# Patient Record
Sex: Female | Born: 1950 | Race: Black or African American | Hispanic: No | Marital: Single | Smoking: Former smoker
Health system: Southern US, Community
[De-identification: ages and names within clinical notes are randomized; demographics above are authoritative.]

## PROBLEM LIST (undated history)

## (undated) DIAGNOSIS — U071 COVID-19: Secondary | ICD-10-CM

## (undated) DIAGNOSIS — I639 Cerebral infarction, unspecified: Secondary | ICD-10-CM

## (undated) DIAGNOSIS — E119 Type 2 diabetes mellitus without complications: Secondary | ICD-10-CM

## (undated) DIAGNOSIS — I10 Essential (primary) hypertension: Secondary | ICD-10-CM

## (undated) DIAGNOSIS — R011 Cardiac murmur, unspecified: Secondary | ICD-10-CM

## (undated) DIAGNOSIS — J1282 Pneumonia due to coronavirus disease 2019: Secondary | ICD-10-CM

## (undated) DIAGNOSIS — I779 Disorder of arteries and arterioles, unspecified: Secondary | ICD-10-CM

## (undated) DIAGNOSIS — R197 Diarrhea, unspecified: Secondary | ICD-10-CM

## (undated) DIAGNOSIS — J449 Chronic obstructive pulmonary disease, unspecified: Secondary | ICD-10-CM

## (undated) DIAGNOSIS — E875 Hyperkalemia: Secondary | ICD-10-CM

## (undated) DIAGNOSIS — I2510 Atherosclerotic heart disease of native coronary artery without angina pectoris: Secondary | ICD-10-CM

## (undated) DIAGNOSIS — K922 Gastrointestinal hemorrhage, unspecified: Secondary | ICD-10-CM

## (undated) DIAGNOSIS — N186 End stage renal disease: Secondary | ICD-10-CM

## (undated) DIAGNOSIS — Z992 Dependence on renal dialysis: Secondary | ICD-10-CM

## (undated) DIAGNOSIS — E785 Hyperlipidemia, unspecified: Secondary | ICD-10-CM

## (undated) HISTORY — DX: Diarrhea, unspecified: R19.7

## (undated) HISTORY — DX: Hyperkalemia: E87.5

## (undated) HISTORY — DX: COVID-19: U07.1

## (undated) HISTORY — DX: Hyperlipidemia, unspecified: E78.5

## (undated) HISTORY — DX: Pneumonia due to coronavirus disease 2019: J12.82

## (undated) HISTORY — DX: Cardiac murmur, unspecified: R01.1

---

## 2000-07-19 HISTORY — PX: BREAST BIOPSY: SHX20

## 2004-02-27 ENCOUNTER — Other Ambulatory Visit: Payer: Self-pay

## 2005-05-29 ENCOUNTER — Emergency Department: Payer: Self-pay | Admitting: Emergency Medicine

## 2007-07-27 ENCOUNTER — Ambulatory Visit: Payer: Self-pay | Admitting: Internal Medicine

## 2007-08-16 ENCOUNTER — Ambulatory Visit: Payer: Self-pay | Admitting: Chiropractic Medicine

## 2007-10-17 ENCOUNTER — Ambulatory Visit: Payer: Self-pay | Admitting: Pain Medicine

## 2007-10-30 ENCOUNTER — Ambulatory Visit: Payer: Self-pay | Admitting: Pain Medicine

## 2007-11-14 ENCOUNTER — Ambulatory Visit: Payer: Self-pay | Admitting: Physician Assistant

## 2007-11-27 ENCOUNTER — Ambulatory Visit: Payer: Self-pay | Admitting: Pain Medicine

## 2007-12-11 ENCOUNTER — Ambulatory Visit: Payer: Self-pay | Admitting: Pain Medicine

## 2007-12-21 ENCOUNTER — Encounter: Payer: Self-pay | Admitting: Pediatrics

## 2007-12-24 ENCOUNTER — Encounter: Payer: Self-pay | Admitting: Pediatrics

## 2007-12-26 ENCOUNTER — Ambulatory Visit: Payer: Self-pay | Admitting: Physician Assistant

## 2008-01-08 ENCOUNTER — Ambulatory Visit: Payer: Self-pay | Admitting: Pain Medicine

## 2008-01-21 ENCOUNTER — Ambulatory Visit: Payer: Self-pay | Admitting: Pain Medicine

## 2008-01-22 ENCOUNTER — Ambulatory Visit: Payer: Self-pay | Admitting: Physician Assistant

## 2008-01-23 ENCOUNTER — Ambulatory Visit: Payer: Self-pay | Admitting: Gastroenterology

## 2008-03-04 ENCOUNTER — Encounter: Payer: Self-pay | Admitting: Pediatrics

## 2008-03-25 ENCOUNTER — Ambulatory Visit: Payer: Self-pay | Admitting: Physician Assistant

## 2008-03-25 ENCOUNTER — Encounter: Payer: Self-pay | Admitting: Pediatrics

## 2008-11-19 ENCOUNTER — Inpatient Hospital Stay: Payer: Self-pay | Admitting: Internal Medicine

## 2008-12-12 ENCOUNTER — Ambulatory Visit: Payer: Self-pay | Admitting: Pediatrics

## 2009-03-30 ENCOUNTER — Inpatient Hospital Stay: Payer: Self-pay | Admitting: Internal Medicine

## 2010-02-24 ENCOUNTER — Emergency Department: Payer: Self-pay | Admitting: Emergency Medicine

## 2010-10-22 ENCOUNTER — Ambulatory Visit: Payer: Self-pay | Admitting: Pediatrics

## 2010-10-24 ENCOUNTER — Ambulatory Visit: Payer: Self-pay | Admitting: Pediatrics

## 2011-04-10 ENCOUNTER — Inpatient Hospital Stay: Payer: Self-pay | Admitting: Internal Medicine

## 2011-05-10 ENCOUNTER — Ambulatory Visit: Payer: Self-pay | Admitting: Pediatrics

## 2011-05-25 ENCOUNTER — Ambulatory Visit: Payer: Self-pay | Admitting: Pediatrics

## 2011-08-31 ENCOUNTER — Inpatient Hospital Stay: Payer: Self-pay | Admitting: Internal Medicine

## 2011-08-31 LAB — COMPREHENSIVE METABOLIC PANEL
Calcium, Total: 9.3 mg/dL (ref 8.5–10.1)
Chloride: 106 mmol/L (ref 98–107)
Co2: 22 mmol/L (ref 21–32)
EGFR (Non-African Amer.): 19 — ABNORMAL LOW
Osmolality: 300 (ref 275–301)
Potassium: 4.5 mmol/L (ref 3.5–5.1)
SGOT(AST): 15 U/L (ref 15–37)
Sodium: 141 mmol/L (ref 136–145)

## 2011-08-31 LAB — CBC
HCT: 24.8 % — ABNORMAL LOW (ref 35.0–47.0)
HGB: 7.9 g/dL — ABNORMAL LOW (ref 12.0–16.0)
MCHC: 32.1 g/dL (ref 32.0–36.0)
MCV: 80 fL (ref 80–100)
RDW: 17.1 % — ABNORMAL HIGH (ref 11.5–14.5)
WBC: 11.5 10*3/uL — ABNORMAL HIGH (ref 3.6–11.0)

## 2011-08-31 LAB — TROPONIN I
Troponin-I: <0.02 ng/mL
Troponin-I: <0.02 ng/mL

## 2011-08-31 LAB — CK TOTAL AND CKMB (NOT AT ARMC)
CK, Total: 67 U/L (ref 21–215)
CK-MB: <0.5 ng/mL — ABNORMAL LOW (ref 0.5–3.6)

## 2011-08-31 LAB — TSH: Thyroid Stimulating Horm: 1.23 u[IU]/mL

## 2011-09-01 LAB — COMPREHENSIVE METABOLIC PANEL
Alkaline Phosphatase: 99 U/L (ref 50–136)
Anion Gap: 9 (ref 7–16)
Calcium, Total: 9 mg/dL (ref 8.5–10.1)
Chloride: 103 mmol/L (ref 98–107)
Co2: 24 mmol/L (ref 21–32)
Creatinine: 2.76 mg/dL — ABNORMAL HIGH (ref 0.60–1.30)
EGFR (Non-African Amer.): 19 — ABNORMAL LOW
Osmolality: 290 (ref 275–301)
Potassium: 4.4 mmol/L (ref 3.5–5.1)
SGOT(AST): 15 U/L (ref 15–37)
SGPT (ALT): 20 U/L
Sodium: 136 mmol/L (ref 136–145)
Total Protein: 7.5 g/dL (ref 6.4–8.2)

## 2011-09-01 LAB — PROTIME-INR
INR: 1
Prothrombin Time: 13.9 secs (ref 11.5–14.7)

## 2011-09-01 LAB — CBC WITH DIFFERENTIAL/PLATELET
Basophil #: 0.1 10*3/uL (ref 0.0–0.1)
Eosinophil #: 0.2 10*3/uL (ref 0.0–0.7)
Eosinophil %: 1.7 %
Lymphocyte #: 2.3 10*3/uL (ref 1.0–3.6)
Lymphocyte %: 20 %
MCH: 26.2 pg (ref 26.0–34.0)
MCHC: 32.6 g/dL (ref 32.0–36.0)
MCV: 80 fL (ref 80–100)
Monocyte #: 0.7 10*3/uL (ref 0.0–0.7)
Neutrophil %: 71.4 %
Platelet: 271 10*3/uL (ref 150–440)
RDW: 18 % — ABNORMAL HIGH (ref 11.5–14.5)
WBC: 11.3 10*3/uL — ABNORMAL HIGH (ref 3.6–11.0)

## 2011-09-01 LAB — CK TOTAL AND CKMB (NOT AT ARMC): CK, Total: 51 U/L (ref 21–215)

## 2011-09-02 LAB — BASIC METABOLIC PANEL
BUN: 55 mg/dL — ABNORMAL HIGH (ref 7–18)
Calcium, Total: 9.1 mg/dL (ref 8.5–10.1)
Chloride: 103 mmol/L (ref 98–107)
Co2: 25 mmol/L (ref 21–32)
Creatinine: 3.14 mg/dL — ABNORMAL HIGH (ref 0.60–1.30)
EGFR (African American): 19 — ABNORMAL LOW
EGFR (Non-African Amer.): 16 — ABNORMAL LOW
Osmolality: 295 (ref 275–301)
Sodium: 138 mmol/L (ref 136–145)

## 2011-09-03 LAB — BASIC METABOLIC PANEL
Anion Gap: 12 (ref 7–16)
Calcium, Total: 9 mg/dL (ref 8.5–10.1)
Calcium, Total: 9.4 mg/dL (ref 8.5–10.1)
Chloride: 103 mmol/L (ref 98–107)
Co2: 21 mmol/L (ref 21–32)
Co2: 20 mmol/L — ABNORMAL LOW (ref 21–32)
Creatinine: 3.44 mg/dL — ABNORMAL HIGH (ref 0.60–1.30)
Creatinine: 3.51 mg/dL — ABNORMAL HIGH (ref 0.60–1.30)
EGFR (African American): 18 — ABNORMAL LOW
EGFR (African American): 17 — ABNORMAL LOW
EGFR (Non-African Amer.): 14 — ABNORMAL LOW
Glucose: 331 mg/dL — ABNORMAL HIGH (ref 65–99)
Potassium: 6.1 mmol/L — ABNORMAL HIGH (ref 3.5–5.1)
Potassium: 5.7 mmol/L — ABNORMAL HIGH (ref 3.5–5.1)
Sodium: 136 mmol/L (ref 136–145)

## 2011-09-04 LAB — BASIC METABOLIC PANEL
Anion Gap: 14 (ref 7–16)
BUN: 76 mg/dL — ABNORMAL HIGH (ref 7–18)
Calcium, Total: 8.9 mg/dL (ref 8.5–10.1)
Co2: 23 mmol/L (ref 21–32)
Creatinine: 3.63 mg/dL — ABNORMAL HIGH (ref 0.60–1.30)
EGFR (African American): 16 — ABNORMAL LOW
EGFR (Non-African Amer.): 14 — ABNORMAL LOW
Glucose: 250 mg/dL — ABNORMAL HIGH (ref 65–99)
Potassium: 4.6 mmol/L (ref 3.5–5.1)
Sodium: 137 mmol/L (ref 136–145)

## 2011-09-04 LAB — CBC WITH DIFFERENTIAL/PLATELET
Basophil %: 0 %
Eosinophil %: 0 %
Lymphocyte #: 1.3 10*3/uL (ref 1.0–3.6)
Lymphocyte %: 7.2 %
MCH: 26.4 pg (ref 26.0–34.0)
MCV: 81 fL (ref 80–100)
Monocyte #: 1.4 10*3/uL — ABNORMAL HIGH (ref 0.0–0.7)
Neutrophil #: 15.2 10*3/uL — ABNORMAL HIGH (ref 1.4–6.5)
Platelet: 275 10*3/uL (ref 150–440)
RBC: 3.09 10*6/uL — ABNORMAL LOW (ref 3.80–5.20)
WBC: 17.9 10*3/uL — ABNORMAL HIGH (ref 3.6–11.0)

## 2011-09-04 LAB — MAGNESIUM: Magnesium: 1.9 mg/dL

## 2011-09-05 LAB — CBC WITH DIFFERENTIAL/PLATELET
Basophil #: 0.1 10*3/uL (ref 0.0–0.1)
Eosinophil #: 0.1 10*3/uL (ref 0.0–0.7)
HCT: 23.6 % — ABNORMAL LOW (ref 35.0–47.0)
HGB: 7.8 g/dL — ABNORMAL LOW (ref 12.0–16.0)
Lymphocyte #: 2.3 10*3/uL (ref 1.0–3.6)
Lymphocyte %: 16.1 %
MCH: 26.5 pg (ref 26.0–34.0)
MCHC: 32.8 g/dL (ref 32.0–36.0)
MCV: 81 fL (ref 80–100)
Monocyte %: 8.6 %
Neutrophil #: 10.4 10*3/uL — ABNORMAL HIGH (ref 1.4–6.5)
RBC: 2.93 10*6/uL — ABNORMAL LOW (ref 3.80–5.20)
RDW: 18.2 % — ABNORMAL HIGH (ref 11.5–14.5)

## 2011-09-05 LAB — BASIC METABOLIC PANEL
Anion Gap: 13 (ref 7–16)
BUN: 81 mg/dL — ABNORMAL HIGH (ref 7–18)
Chloride: 101 mmol/L (ref 98–107)
Co2: 26 mmol/L (ref 21–32)
Creatinine: 3.54 mg/dL — ABNORMAL HIGH (ref 0.60–1.30)
Glucose: 175 mg/dL — ABNORMAL HIGH (ref 65–99)
Sodium: 140 mmol/L (ref 136–145)

## 2011-09-05 LAB — PROTEIN / CREATININE RATIO, URINE
Creatinine, Urine: 63.4 mg/dL (ref 30.0–125.0)
Protein, Random Urine: 78 mg/dL — ABNORMAL HIGH (ref 0–12)

## 2011-09-05 LAB — CK: CK, Total: 89 U/L (ref 21–215)

## 2011-09-06 LAB — CBC WITH DIFFERENTIAL/PLATELET
Basophil #: 0.1 10*3/uL (ref 0.0–0.1)
Eosinophil %: 1.3 %
HGB: 7.9 g/dL — ABNORMAL LOW (ref 12.0–16.0)
Lymphocyte #: 2.1 10*3/uL (ref 1.0–3.6)
MCH: 26.5 pg (ref 26.0–34.0)
MCHC: 32.9 g/dL (ref 32.0–36.0)
MCV: 81 fL (ref 80–100)
Monocyte #: 1.5 10*3/uL — ABNORMAL HIGH (ref 0.0–0.7)
Monocyte %: 9.5 %
Neutrophil #: 12.1 10*3/uL — ABNORMAL HIGH (ref 1.4–6.5)
Platelet: 248 10*3/uL (ref 150–440)
RDW: 18.1 % — ABNORMAL HIGH (ref 11.5–14.5)
WBC: 16 10*3/uL — ABNORMAL HIGH (ref 3.6–11.0)

## 2011-09-06 LAB — BASIC METABOLIC PANEL
Anion Gap: 7 (ref 7–16)
BUN: 72 mg/dL — ABNORMAL HIGH (ref 7–18)
Calcium, Total: 8.9 mg/dL (ref 8.5–10.1)
Chloride: 102 mmol/L (ref 98–107)
Co2: 25 mmol/L (ref 21–32)
EGFR (Non-African Amer.): 15 — ABNORMAL LOW
Glucose: 169 mg/dL — ABNORMAL HIGH (ref 65–99)
Osmolality: 293 (ref 275–301)
Potassium: 3.8 mmol/L (ref 3.5–5.1)
Sodium: 134 mmol/L — ABNORMAL LOW (ref 136–145)

## 2011-09-06 LAB — UR PROT ELECTROPHORESIS, URINE RANDOM

## 2011-09-07 LAB — BASIC METABOLIC PANEL
BUN: 68 mg/dL — ABNORMAL HIGH (ref 7–18)
Calcium, Total: 8.8 mg/dL (ref 8.5–10.1)
Co2: 25 mmol/L (ref 21–32)
Osmolality: 297 (ref 275–301)
Potassium: 4.3 mmol/L (ref 3.5–5.1)
Sodium: 138 mmol/L (ref 136–145)

## 2011-09-07 LAB — HEMOGLOBIN: HGB: 8.2 g/dL — ABNORMAL LOW (ref 12.0–16.0)

## 2011-09-08 LAB — CBC WITH DIFFERENTIAL/PLATELET
Basophil #: 0 10*3/uL (ref 0.0–0.1)
Basophil %: 0 %
Eosinophil #: 0 10*3/uL (ref 0.0–0.7)
Eosinophil %: 0 %
HCT: 26.2 % — ABNORMAL LOW (ref 35.0–47.0)
Lymphocyte #: 0.8 10*3/uL — ABNORMAL LOW (ref 1.0–3.6)
MCH: 26.4 pg (ref 26.0–34.0)
MCV: 82 fL (ref 80–100)
Monocyte %: 3.7 %
Neutrophil #: 12.5 10*3/uL — ABNORMAL HIGH (ref 1.4–6.5)
Platelet: 263 10*3/uL (ref 150–440)
RBC: 3.2 10*6/uL — ABNORMAL LOW (ref 3.80–5.20)
RDW: 18.3 % — ABNORMAL HIGH (ref 11.5–14.5)
WBC: 13.8 10*3/uL — ABNORMAL HIGH (ref 3.6–11.0)

## 2011-09-08 LAB — BASIC METABOLIC PANEL
Anion Gap: 8 (ref 7–16)
Calcium, Total: 9.2 mg/dL (ref 8.5–10.1)
Co2: 25 mmol/L (ref 21–32)
Creatinine: 3.48 mg/dL — ABNORMAL HIGH (ref 0.60–1.30)
EGFR (African American): 17 — ABNORMAL LOW
Osmolality: 299 (ref 275–301)

## 2011-10-12 ENCOUNTER — Ambulatory Visit: Payer: Self-pay | Admitting: Internal Medicine

## 2011-10-12 ENCOUNTER — Ambulatory Visit: Payer: Self-pay | Admitting: Oncology

## 2011-10-24 ENCOUNTER — Ambulatory Visit: Payer: Self-pay | Admitting: Oncology

## 2011-10-24 ENCOUNTER — Ambulatory Visit: Payer: Self-pay | Admitting: Internal Medicine

## 2011-11-01 LAB — OCCULT BLOOD X 1 CARD TO LAB, STOOL: Occult Blood, Feces: NEGATIVE

## 2011-11-08 LAB — CANCER CENTER HEMOGLOBIN: HGB: 8.9 g/dL — ABNORMAL LOW (ref 12.0–16.0)

## 2011-11-14 ENCOUNTER — Ambulatory Visit: Payer: Self-pay | Admitting: Internal Medicine

## 2011-11-23 ENCOUNTER — Ambulatory Visit: Payer: Self-pay | Admitting: Internal Medicine

## 2011-11-23 ENCOUNTER — Ambulatory Visit: Payer: Self-pay | Admitting: Oncology

## 2011-12-27 ENCOUNTER — Ambulatory Visit: Payer: Self-pay | Admitting: Internal Medicine

## 2011-12-27 LAB — CANCER CENTER HEMOGLOBIN: HGB: 9.4 g/dL — ABNORMAL LOW (ref 12.0–16.0)

## 2012-01-11 ENCOUNTER — Ambulatory Visit: Payer: Self-pay | Admitting: Internal Medicine

## 2012-01-23 ENCOUNTER — Ambulatory Visit: Payer: Self-pay | Admitting: Internal Medicine

## 2012-01-24 LAB — CANCER CENTER HEMOGLOBIN: HGB: 8.8 g/dL — ABNORMAL LOW (ref 12.0–16.0)

## 2012-01-24 LAB — CREATININE, SERUM
Creatinine: 3.36 mg/dL — ABNORMAL HIGH (ref 0.60–1.30)
EGFR (African American): 16 — ABNORMAL LOW
EGFR (Non-African Amer.): 14 — ABNORMAL LOW

## 2012-01-31 ENCOUNTER — Ambulatory Visit: Payer: Self-pay | Admitting: Internal Medicine

## 2012-02-14 LAB — IRON AND TIBC
Iron Saturation: 17 %
Iron: 58 ug/dL (ref 50–170)
Unbound Iron-Bind.Cap.: 283 ug/dL

## 2012-02-14 LAB — FERRITIN: Ferritin (ARMC): 111 ng/mL (ref 8–388)

## 2012-02-23 ENCOUNTER — Ambulatory Visit: Payer: Self-pay | Admitting: Internal Medicine

## 2012-09-14 ENCOUNTER — Inpatient Hospital Stay: Payer: Self-pay | Admitting: Internal Medicine

## 2012-09-14 ENCOUNTER — Ambulatory Visit: Payer: Self-pay | Admitting: Family Medicine

## 2012-09-14 ENCOUNTER — Ambulatory Visit: Payer: Self-pay | Admitting: Emergency Medicine

## 2012-09-14 LAB — COMPREHENSIVE METABOLIC PANEL
Albumin: 3.6 g/dL (ref 3.4–5.0)
Alkaline Phosphatase: 150 U/L — ABNORMAL HIGH (ref 50–136)
BUN: 40 mg/dL — ABNORMAL HIGH (ref 7–18)
Bilirubin,Total: 0.3 mg/dL (ref 0.2–1.0)
Bilirubin,Total: 0.3 mg/dL (ref 0.2–1.0)
Calcium, Total: 9 mg/dL (ref 8.5–10.1)
Chloride: 104 mmol/L (ref 98–107)
Chloride: 106 mmol/L (ref 98–107)
Co2: 22 mmol/L (ref 21–32)
Creatinine: 2.89 mg/dL — ABNORMAL HIGH (ref 0.60–1.30)
EGFR (African American): 20 — ABNORMAL LOW
EGFR (African American): 22 — ABNORMAL LOW
EGFR (Non-African Amer.): 17 — ABNORMAL LOW
EGFR (Non-African Amer.): 19 — ABNORMAL LOW
Osmolality: 294 (ref 275–301)
Osmolality: 293 (ref 275–301)
Potassium: 4.2 mmol/L (ref 3.5–5.1)
SGOT(AST): 18 U/L (ref 15–37)
SGPT (ALT): 19 U/L (ref 12–78)
Sodium: 138 mmol/L (ref 136–145)
Total Protein: 8.9 g/dL — ABNORMAL HIGH (ref 6.4–8.2)

## 2012-09-14 LAB — URINALYSIS, COMPLETE
Bacteria: NONE SEEN
Bilirubin,UR: NEGATIVE
Glucose,UR: 100 mg/dL (ref 0–75)
Glucose,UR: >=500 mg/dL (ref 0–75)
Ketone: NEGATIVE
Nitrite: NEGATIVE
Nitrite: NEGATIVE
Ph: 6 (ref 4.5–8.0)
Ph: 5 (ref 4.5–8.0)
Protein: >=500
Specific Gravity: 1.02 (ref 1.003–1.030)
Specific Gravity: 1.015 (ref 1.003–1.030)
WBC UR: 1 /HPF (ref 0–5)

## 2012-09-14 LAB — CBC WITH DIFFERENTIAL/PLATELET
Basophil #: 0.1 10*3/uL (ref 0.0–0.1)
Basophil %: 0.3 %
Lymphocyte #: 0.9 10*3/uL — ABNORMAL LOW (ref 1.0–3.6)
MCH: 24.5 pg — ABNORMAL LOW (ref 26.0–34.0)
MCHC: 31.1 g/dL — ABNORMAL LOW (ref 32.0–36.0)
MCV: 79 fL — ABNORMAL LOW (ref 80–100)
Monocyte %: 3.2 %
Neutrophil #: 14.2 10*3/uL — ABNORMAL HIGH (ref 1.4–6.5)
Neutrophil %: 90.8 %
Platelet: 290 10*3/uL (ref 150–440)
RDW: 16.9 % — ABNORMAL HIGH (ref 11.5–14.5)
WBC: 15.7 10*3/uL — ABNORMAL HIGH (ref 3.6–11.0)

## 2012-09-14 LAB — RAPID INFLUENZA A&B ANTIGENS

## 2012-09-14 LAB — CBC
HCT: 37 % (ref 35.0–47.0)
HGB: 11.7 g/dL — ABNORMAL LOW (ref 12.0–16.0)
MCH: 25 pg — ABNORMAL LOW (ref 26.0–34.0)
MCHC: 31.5 g/dL — ABNORMAL LOW (ref 32.0–36.0)
MCV: 79 fL — ABNORMAL LOW (ref 80–100)
RDW: 16.7 % — ABNORMAL HIGH (ref 11.5–14.5)

## 2012-09-14 LAB — LIPASE, BLOOD
Lipase: 299 U/L (ref 73–393)
Lipase: 243 U/L (ref 73–393)

## 2012-09-15 LAB — CBC WITH DIFFERENTIAL/PLATELET
Basophil #: 0 10*3/uL (ref 0.0–0.1)
Eosinophil #: 0 10*3/uL (ref 0.0–0.7)
Eosinophil %: 0 %
HCT: 31.5 % — ABNORMAL LOW (ref 35.0–47.0)
HGB: 9.9 g/dL — ABNORMAL LOW (ref 12.0–16.0)
MCH: 25.1 pg — ABNORMAL LOW (ref 26.0–34.0)
MCHC: 31.4 g/dL — ABNORMAL LOW (ref 32.0–36.0)
MCV: 80 fL (ref 80–100)
Monocyte #: 0.9 x10 3/mm (ref 0.2–0.9)
Platelet: 264 10*3/uL (ref 150–440)
RBC: 3.94 10*6/uL (ref 3.80–5.20)
RDW: 16.9 % — ABNORMAL HIGH (ref 11.5–14.5)
WBC: 11.1 10*3/uL — ABNORMAL HIGH (ref 3.6–11.0)

## 2012-09-15 LAB — BASIC METABOLIC PANEL
Anion Gap: 12 (ref 7–16)
Calcium, Total: 8.1 mg/dL — ABNORMAL LOW (ref 8.5–10.1)
Creatinine: 2.49 mg/dL — ABNORMAL HIGH (ref 0.60–1.30)
EGFR (Non-African Amer.): 20 — ABNORMAL LOW
Osmolality: 286 (ref 275–301)
Sodium: 138 mmol/L (ref 136–145)

## 2012-09-15 LAB — MAGNESIUM: Magnesium: 1.3 mg/dL — ABNORMAL LOW

## 2012-09-16 LAB — CBC WITH DIFFERENTIAL/PLATELET
Basophil #: 0 10*3/uL (ref 0.0–0.1)
Eosinophil %: 0.7 %
HCT: 30.5 % — ABNORMAL LOW (ref 35.0–47.0)
MCH: 25.1 pg — ABNORMAL LOW (ref 26.0–34.0)
MCHC: 31.5 g/dL — ABNORMAL LOW (ref 32.0–36.0)
MCV: 80 fL (ref 80–100)
Neutrophil #: 6.6 10*3/uL — ABNORMAL HIGH (ref 1.4–6.5)
Platelet: 236 10*3/uL (ref 150–440)
RBC: 3.82 10*6/uL (ref 3.80–5.20)
RDW: 16.9 % — ABNORMAL HIGH (ref 11.5–14.5)

## 2012-09-16 LAB — BASIC METABOLIC PANEL
Anion Gap: 9 (ref 7–16)
Calcium, Total: 8.8 mg/dL (ref 8.5–10.1)
Co2: 21 mmol/L (ref 21–32)
Creatinine: 2.95 mg/dL — ABNORMAL HIGH (ref 0.60–1.30)
EGFR (African American): 19 — ABNORMAL LOW
EGFR (Non-African Amer.): 16 — ABNORMAL LOW
Osmolality: 286 (ref 275–301)
Sodium: 136 mmol/L (ref 136–145)

## 2012-09-16 LAB — URINE CULTURE

## 2012-09-18 LAB — BASIC METABOLIC PANEL
BUN: 50 mg/dL — ABNORMAL HIGH (ref 7–18)
Calcium, Total: 9 mg/dL (ref 8.5–10.1)
Chloride: 104 mmol/L (ref 98–107)
Co2: 20 mmol/L — ABNORMAL LOW (ref 21–32)
EGFR (Non-African Amer.): 15 — ABNORMAL LOW
Osmolality: 291 (ref 275–301)
Potassium: 4.5 mmol/L (ref 3.5–5.1)

## 2012-09-19 LAB — BASIC METABOLIC PANEL
Anion Gap: 8 (ref 7–16)
BUN: 59 mg/dL — ABNORMAL HIGH (ref 7–18)
Chloride: 106 mmol/L (ref 98–107)
Co2: 20 mmol/L — ABNORMAL LOW (ref 21–32)
Creatinine: 2.99 mg/dL — ABNORMAL HIGH (ref 0.60–1.30)
EGFR (Non-African Amer.): 16 — ABNORMAL LOW
Glucose: 248 mg/dL — ABNORMAL HIGH (ref 65–99)
Osmolality: 293 (ref 275–301)
Potassium: 5.2 mmol/L — ABNORMAL HIGH (ref 3.5–5.1)
Sodium: 134 mmol/L — ABNORMAL LOW (ref 136–145)

## 2012-09-20 LAB — CULTURE, BLOOD (SINGLE)

## 2013-05-12 ENCOUNTER — Inpatient Hospital Stay: Payer: Self-pay | Admitting: Internal Medicine

## 2013-05-12 LAB — COMPREHENSIVE METABOLIC PANEL
Bilirubin,Total: 0.5 mg/dL (ref 0.2–1.0)
Calcium, Total: 9.6 mg/dL (ref 8.5–10.1)
Co2: 22 mmol/L (ref 21–32)
Creatinine: 2.87 mg/dL — ABNORMAL HIGH (ref 0.60–1.30)
EGFR (African American): 20 — ABNORMAL LOW
EGFR (Non-African Amer.): 17 — ABNORMAL LOW
Osmolality: 292 (ref 275–301)
Potassium: 3.7 mmol/L (ref 3.5–5.1)
SGPT (ALT): 19 U/L (ref 12–78)
Sodium: 139 mmol/L (ref 136–145)

## 2013-05-12 LAB — URINALYSIS, COMPLETE
Bacteria: NONE SEEN
Bilirubin,UR: NEGATIVE
Leukocyte Esterase: NEGATIVE
Nitrite: NEGATIVE
Ph: 6 (ref 4.5–8.0)
Protein: >=500
WBC UR: <1 /HPF (ref 0–5)

## 2013-05-12 LAB — CBC
HCT: 39.4 % (ref 35.0–47.0)
HGB: 12.8 g/dL (ref 12.0–16.0)
MCH: 25.7 pg — ABNORMAL LOW (ref 26.0–34.0)
MCHC: 32.5 g/dL (ref 32.0–36.0)
Platelet: 241 10*3/uL (ref 150–440)
RBC: 4.97 10*6/uL (ref 3.80–5.20)
RDW: 18.3 % — ABNORMAL HIGH (ref 11.5–14.5)

## 2013-05-12 LAB — LIPASE, BLOOD: Lipase: 298 U/L (ref 73–393)

## 2013-05-12 LAB — TROPONIN I: Troponin-I: <0.02 ng/mL

## 2013-05-13 LAB — CBC WITH DIFFERENTIAL/PLATELET
Basophil #: 0 10*3/uL (ref 0.0–0.1)
Basophil %: 0.2 %
HCT: 34.9 % — ABNORMAL LOW (ref 35.0–47.0)
Lymphocyte %: 8.1 %
MCH: 25.9 pg — ABNORMAL LOW (ref 26.0–34.0)
MCHC: 32.4 g/dL (ref 32.0–36.0)
MCV: 80 fL (ref 80–100)
Neutrophil #: 13.5 10*3/uL — ABNORMAL HIGH (ref 1.4–6.5)
Platelet: 238 10*3/uL (ref 150–440)
RBC: 4.35 10*6/uL (ref 3.80–5.20)
RDW: 18.5 % — ABNORMAL HIGH (ref 11.5–14.5)

## 2013-05-13 LAB — COMPREHENSIVE METABOLIC PANEL
Albumin: 2.9 g/dL — ABNORMAL LOW (ref 3.4–5.0)
Alkaline Phosphatase: 132 U/L (ref 50–136)
Anion Gap: 8 (ref 7–16)
BUN: 47 mg/dL — ABNORMAL HIGH (ref 7–18)
Bilirubin,Total: 0.4 mg/dL (ref 0.2–1.0)
Calcium, Total: 9.1 mg/dL (ref 8.5–10.1)
Chloride: 110 mmol/L — ABNORMAL HIGH (ref 98–107)
Creatinine: 3.48 mg/dL — ABNORMAL HIGH (ref 0.60–1.30)
EGFR (Non-African Amer.): 13 — ABNORMAL LOW
Glucose: 205 mg/dL — ABNORMAL HIGH (ref 65–99)
Osmolality: 298 (ref 275–301)
Potassium: 5 mmol/L (ref 3.5–5.1)
SGOT(AST): 17 U/L (ref 15–37)
SGPT (ALT): 16 U/L (ref 12–78)
Total Protein: 7.3 g/dL (ref 6.4–8.2)

## 2013-05-15 LAB — CBC WITH DIFFERENTIAL/PLATELET
Basophil #: 0.1 10*3/uL (ref 0.0–0.1)
Eosinophil %: 0 %
HCT: 33.9 % — ABNORMAL LOW (ref 35.0–47.0)
Lymphocyte #: 0.9 10*3/uL — ABNORMAL LOW (ref 1.0–3.6)
Lymphocyte %: 8.4 %
MCH: 26.4 pg (ref 26.0–34.0)
MCV: 80 fL (ref 80–100)
Platelet: 190 10*3/uL (ref 150–440)
RBC: 4.22 10*6/uL (ref 3.80–5.20)
WBC: 10.6 10*3/uL (ref 3.6–11.0)

## 2013-05-15 LAB — BASIC METABOLIC PANEL
Anion Gap: 9 (ref 7–16)
BUN: 65 mg/dL — ABNORMAL HIGH (ref 7–18)
Calcium, Total: 9.1 mg/dL (ref 8.5–10.1)
Chloride: 103 mmol/L (ref 98–107)
Co2: 19 mmol/L — ABNORMAL LOW (ref 21–32)
EGFR (African American): 11 — ABNORMAL LOW
Osmolality: 291 (ref 275–301)
Potassium: 4.7 mmol/L (ref 3.5–5.1)
Sodium: 131 mmol/L — ABNORMAL LOW (ref 136–145)

## 2013-05-16 DIAGNOSIS — R079 Chest pain, unspecified: Secondary | ICD-10-CM

## 2013-05-16 LAB — BASIC METABOLIC PANEL
BUN: 68 mg/dL — ABNORMAL HIGH (ref 7–18)
Chloride: 103 mmol/L (ref 98–107)
Co2: 21 mmol/L (ref 21–32)
Creatinine: 4.46 mg/dL — ABNORMAL HIGH (ref 0.60–1.30)
EGFR (African American): 11 — ABNORMAL LOW
Osmolality: 291 (ref 275–301)
Potassium: 4.7 mmol/L (ref 3.5–5.1)
Sodium: 132 mmol/L — ABNORMAL LOW (ref 136–145)

## 2013-05-16 LAB — PROTEIN / CREATININE RATIO, URINE: Protein/Creat. Ratio: 2920 mg/gCREAT — ABNORMAL HIGH (ref 0–200)

## 2013-05-17 LAB — BASIC METABOLIC PANEL
Anion Gap: 8 (ref 7–16)
BUN: 73 mg/dL — ABNORMAL HIGH (ref 7–18)
Calcium, Total: 9.1 mg/dL (ref 8.5–10.1)
EGFR (African American): 13 — ABNORMAL LOW
Osmolality: 294 (ref 275–301)
Potassium: 4.9 mmol/L (ref 3.5–5.1)
Sodium: 130 mmol/L — ABNORMAL LOW (ref 136–145)

## 2013-05-18 LAB — RENAL FUNCTION PANEL
Albumin: 3 g/dL — ABNORMAL LOW (ref 3.4–5.0)
BUN: 77 mg/dL — ABNORMAL HIGH (ref 7–18)
EGFR (African American): 12 — ABNORMAL LOW
EGFR (Non-African Amer.): 10 — ABNORMAL LOW
Glucose: 362 mg/dL — ABNORMAL HIGH (ref 65–99)
Osmolality: 300 (ref 275–301)
Potassium: 5.2 mmol/L — ABNORMAL HIGH (ref 3.5–5.1)
Sodium: 131 mmol/L — ABNORMAL LOW (ref 136–145)

## 2013-05-19 LAB — BASIC METABOLIC PANEL
Anion Gap: 9 (ref 7–16)
Chloride: 101 mmol/L (ref 98–107)
Co2: 22 mmol/L (ref 21–32)
EGFR (African American): 13 — ABNORMAL LOW
EGFR (Non-African Amer.): 11 — ABNORMAL LOW
Glucose: 278 mg/dL — ABNORMAL HIGH (ref 65–99)

## 2013-05-20 LAB — PROTEIN ELECTROPHORESIS(ARMC)

## 2013-05-20 LAB — KAPPA/LAMBDA FREE LIGHT CHAINS (ARMC)

## 2013-06-11 ENCOUNTER — Ambulatory Visit: Payer: Self-pay | Admitting: Internal Medicine

## 2013-06-11 LAB — CBC CANCER CENTER
Eosinophil #: 0.3 x10 3/mm (ref 0.0–0.7)
Eosinophil %: 3.1 %
MCH: 25.9 pg — ABNORMAL LOW (ref 26.0–34.0)
MCHC: 31.1 g/dL — ABNORMAL LOW (ref 32.0–36.0)
MCV: 83 fL (ref 80–100)
Neutrophil #: 5.9 x10 3/mm (ref 1.4–6.5)
Neutrophil %: 66.5 %
Platelet: 394 x10 3/mm (ref 150–440)
RDW: 17.7 % — ABNORMAL HIGH (ref 11.5–14.5)

## 2013-06-11 LAB — LACTATE DEHYDROGENASE: LDH: 255 U/L — ABNORMAL HIGH (ref 81–246)

## 2013-06-11 LAB — HEPATIC FUNCTION PANEL A (ARMC)
Albumin: 3.1 g/dL — ABNORMAL LOW (ref 3.4–5.0)
Bilirubin, Direct: 0.1 mg/dL (ref 0.00–0.20)
Bilirubin,Total: 0.3 mg/dL (ref 0.2–1.0)
SGPT (ALT): 18 U/L (ref 12–78)
Total Protein: 7.9 g/dL (ref 6.4–8.2)

## 2013-06-11 LAB — IRON AND TIBC
Iron: 60 ug/dL (ref 50–170)
Unbound Iron-Bind.Cap.: 278 ug/dL

## 2013-06-11 LAB — RETICULOCYTES: Absolute Retic Count: 0.0713 10*6/uL (ref 0.019–0.186)

## 2013-06-24 ENCOUNTER — Ambulatory Visit: Payer: Self-pay | Admitting: Internal Medicine

## 2013-06-28 LAB — CANCER CENTER HEMOGLOBIN: HGB: 9.1 g/dL — ABNORMAL LOW (ref 12.0–16.0)

## 2013-07-12 LAB — CANCER CENTER HEMOGLOBIN: HGB: 9 g/dL — ABNORMAL LOW (ref 12.0–16.0)

## 2013-07-25 ENCOUNTER — Ambulatory Visit: Payer: Self-pay | Admitting: Internal Medicine

## 2013-08-09 LAB — CANCER CENTER HEMOGLOBIN: HGB: 9.9 g/dL — ABNORMAL LOW (ref 12.0–16.0)

## 2013-08-25 ENCOUNTER — Ambulatory Visit: Payer: Self-pay | Admitting: Internal Medicine

## 2013-08-25 ENCOUNTER — Ambulatory Visit: Payer: Self-pay

## 2013-10-02 ENCOUNTER — Ambulatory Visit: Payer: Self-pay | Admitting: Internal Medicine

## 2013-10-02 LAB — CANCER CENTER HEMOGLOBIN: HGB: 9.3 g/dL — ABNORMAL LOW (ref 12.0–16.0)

## 2013-10-23 ENCOUNTER — Ambulatory Visit: Payer: Self-pay | Admitting: Internal Medicine

## 2013-11-06 LAB — CANCER CENTER HEMOGLOBIN: HGB: 10 g/dL — AB (ref 12.0–16.0)

## 2013-11-22 ENCOUNTER — Ambulatory Visit: Payer: Self-pay | Admitting: Internal Medicine

## 2013-11-26 ENCOUNTER — Ambulatory Visit: Payer: Self-pay | Admitting: Internal Medicine

## 2013-12-10 LAB — CANCER CENTER HEMOGLOBIN: HGB: 9.9 g/dL — AB (ref 12.0–16.0)

## 2013-12-15 ENCOUNTER — Emergency Department: Payer: Self-pay | Admitting: Emergency Medicine

## 2013-12-16 LAB — CBC
HCT: 29.4 % — ABNORMAL LOW (ref 35.0–47.0)
HGB: 9.3 g/dL — ABNORMAL LOW (ref 12.0–16.0)
MCH: 25.1 pg — AB (ref 26.0–34.0)
MCHC: 31.6 g/dL — ABNORMAL LOW (ref 32.0–36.0)
MCV: 79 fL — AB (ref 80–100)
PLATELETS: 200 10*3/uL (ref 150–440)
RBC: 3.71 10*6/uL — AB (ref 3.80–5.20)
RDW: 19.9 % — ABNORMAL HIGH (ref 11.5–14.5)
WBC: 16.7 10*3/uL — ABNORMAL HIGH (ref 3.6–11.0)

## 2013-12-16 LAB — COMPREHENSIVE METABOLIC PANEL
ALBUMIN: 2.8 g/dL — AB (ref 3.4–5.0)
ALK PHOS: 225 U/L — AB
Anion Gap: 11 (ref 7–16)
BUN: 79 mg/dL — ABNORMAL HIGH (ref 7–18)
Bilirubin,Total: 0.5 mg/dL (ref 0.2–1.0)
CHLORIDE: 100 mmol/L (ref 98–107)
CREATININE: 4.13 mg/dL — AB (ref 0.60–1.30)
Calcium, Total: 9.1 mg/dL (ref 8.5–10.1)
Co2: 20 mmol/L — ABNORMAL LOW (ref 21–32)
EGFR (African American): 13 — ABNORMAL LOW
GFR CALC NON AF AMER: 11 — AB
GLUCOSE: 296 mg/dL — AB (ref 65–99)
Osmolality: 297 (ref 275–301)
Potassium: 5.3 mmol/L — ABNORMAL HIGH (ref 3.5–5.1)
SGOT(AST): 28 U/L (ref 15–37)
SGPT (ALT): 34 U/L (ref 12–78)
Sodium: 131 mmol/L — ABNORMAL LOW (ref 136–145)
Total Protein: 7.6 g/dL (ref 6.4–8.2)

## 2013-12-16 LAB — PROTIME-INR
INR: 1.2
Prothrombin Time: 14.8 secs — ABNORMAL HIGH (ref 11.5–14.7)

## 2013-12-16 LAB — TROPONIN I

## 2013-12-16 LAB — APTT: Activated PTT: 32.3 secs (ref 23.6–35.9)

## 2013-12-16 LAB — D-DIMER(ARMC): D-DIMER: 1223 ng/mL

## 2013-12-23 ENCOUNTER — Ambulatory Visit: Payer: Self-pay | Admitting: Internal Medicine

## 2013-12-24 ENCOUNTER — Inpatient Hospital Stay: Payer: Self-pay | Admitting: Internal Medicine

## 2013-12-24 LAB — COMPREHENSIVE METABOLIC PANEL
ALBUMIN: 2.6 g/dL — AB (ref 3.4–5.0)
ALK PHOS: 204 U/L — AB
ANION GAP: 6 — AB (ref 7–16)
BILIRUBIN TOTAL: 0.3 mg/dL (ref 0.2–1.0)
BUN: 83 mg/dL — AB (ref 7–18)
CALCIUM: 8.8 mg/dL (ref 8.5–10.1)
CHLORIDE: 108 mmol/L — AB (ref 98–107)
Co2: 24 mmol/L (ref 21–32)
Creatinine: 4.57 mg/dL — ABNORMAL HIGH (ref 0.60–1.30)
EGFR (African American): 11 — ABNORMAL LOW
EGFR (Non-African Amer.): 10 — ABNORMAL LOW
Glucose: 151 mg/dL — ABNORMAL HIGH (ref 65–99)
OSMOLALITY: 304 (ref 275–301)
Potassium: 4.6 mmol/L (ref 3.5–5.1)
SGOT(AST): 19 U/L (ref 15–37)
SGPT (ALT): 24 U/L (ref 12–78)
Sodium: 138 mmol/L (ref 136–145)
Total Protein: 7.1 g/dL (ref 6.4–8.2)

## 2013-12-24 LAB — HEMOGLOBIN A1C: HEMOGLOBIN A1C: 8.2 % — AB (ref 4.2–6.3)

## 2013-12-24 LAB — HEMATOCRIT: HCT: 27.6 % — ABNORMAL LOW (ref 35.0–47.0)

## 2013-12-24 LAB — PRO B NATRIURETIC PEPTIDE: B-Type Natriuretic Peptide: 9370 pg/mL — ABNORMAL HIGH (ref 0–125)

## 2013-12-25 LAB — BASIC METABOLIC PANEL
Anion Gap: 6 — ABNORMAL LOW (ref 7–16)
BUN: 82 mg/dL — AB (ref 7–18)
CO2: 25 mmol/L (ref 21–32)
Calcium, Total: 8.7 mg/dL (ref 8.5–10.1)
Chloride: 109 mmol/L — ABNORMAL HIGH (ref 98–107)
Creatinine: 4.43 mg/dL — ABNORMAL HIGH (ref 0.60–1.30)
GFR CALC AF AMER: 12 — AB
GFR CALC NON AF AMER: 10 — AB
Glucose: 123 mg/dL — ABNORMAL HIGH (ref 65–99)
OSMOLALITY: 306 (ref 275–301)
POTASSIUM: 4.6 mmol/L (ref 3.5–5.1)
Sodium: 140 mmol/L (ref 136–145)

## 2013-12-25 LAB — CBC WITH DIFFERENTIAL/PLATELET
BASOS PCT: 0.9 %
Basophil #: 0.1 10*3/uL (ref 0.0–0.1)
EOS PCT: 2 %
Eosinophil #: 0.2 10*3/uL (ref 0.0–0.7)
HCT: 25.1 % — ABNORMAL LOW (ref 35.0–47.0)
HGB: 8 g/dL — ABNORMAL LOW (ref 12.0–16.0)
LYMPHS ABS: 1.7 10*3/uL (ref 1.0–3.6)
Lymphocyte %: 15.7 %
MCH: 25.2 pg — AB (ref 26.0–34.0)
MCHC: 32 g/dL (ref 32.0–36.0)
MCV: 79 fL — ABNORMAL LOW (ref 80–100)
MONO ABS: 1.2 x10 3/mm — AB (ref 0.2–0.9)
MONOS PCT: 10.9 %
NEUTROS ABS: 7.5 10*3/uL — AB (ref 1.4–6.5)
Neutrophil %: 70.5 %
PLATELETS: 247 10*3/uL (ref 150–440)
RBC: 3.19 10*6/uL — AB (ref 3.80–5.20)
RDW: 20 % — ABNORMAL HIGH (ref 11.5–14.5)
WBC: 10.6 10*3/uL (ref 3.6–11.0)

## 2013-12-25 LAB — IRON AND TIBC
IRON BIND. CAP.(TOTAL): 249 ug/dL — AB (ref 250–450)
IRON: 44 ug/dL — AB (ref 50–170)
Iron Saturation: 18 %
Unbound Iron-Bind.Cap.: 205 ug/dL

## 2013-12-25 LAB — LIPID PANEL
Cholesterol: 78 mg/dL (ref 0–200)
HDL: 31 mg/dL — AB (ref 40–60)
Ldl Cholesterol, Calc: 36 mg/dL (ref 0–100)
TRIGLYCERIDES: 53 mg/dL (ref 0–200)
VLDL CHOLESTEROL, CALC: 11 mg/dL (ref 5–40)

## 2013-12-25 LAB — FERRITIN: FERRITIN (ARMC): 243 ng/mL (ref 8–388)

## 2013-12-25 LAB — PHOSPHORUS: Phosphorus: 5 mg/dL — ABNORMAL HIGH

## 2013-12-27 LAB — PHOSPHORUS: Phosphorus: 2.8 mg/dL (ref 2.5–4.9)

## 2014-02-05 ENCOUNTER — Ambulatory Visit: Payer: Self-pay | Admitting: Internal Medicine

## 2014-02-05 LAB — CBC CANCER CENTER
Basophil #: 0.1 x10 3/mm (ref 0.0–0.1)
Basophil %: 1.3 %
Eosinophil #: 0.2 x10 3/mm (ref 0.0–0.7)
Eosinophil %: 2.2 %
HCT: 41.3 % (ref 35.0–47.0)
HGB: 12.9 g/dL (ref 12.0–16.0)
LYMPHS PCT: 29.6 %
Lymphocyte #: 2.5 x10 3/mm (ref 1.0–3.6)
MCH: 26.2 pg (ref 26.0–34.0)
MCHC: 31.2 g/dL — ABNORMAL LOW (ref 32.0–36.0)
MCV: 84 fL (ref 80–100)
MONO ABS: 0.8 x10 3/mm (ref 0.2–0.9)
Monocyte %: 10 %
NEUTROS ABS: 4.8 x10 3/mm (ref 1.4–6.5)
NEUTROS PCT: 56.9 %
Platelet: 167 x10 3/mm (ref 150–440)
RBC: 4.91 10*6/uL (ref 3.80–5.20)
RDW: 20.9 % — AB (ref 11.5–14.5)
WBC: 8.4 x10 3/mm (ref 3.6–11.0)

## 2014-02-12 ENCOUNTER — Ambulatory Visit: Payer: Self-pay | Admitting: Vascular Surgery

## 2014-02-12 LAB — URINALYSIS, COMPLETE
BLOOD: NEGATIVE
Bilirubin,UR: NEGATIVE
Glucose,UR: NEGATIVE mg/dL (ref 0–75)
Ketone: NEGATIVE
Leukocyte Esterase: NEGATIVE
NITRITE: NEGATIVE
Ph: 5 (ref 4.5–8.0)
Protein: 100
RBC,UR: 2 /HPF (ref 0–5)
Specific Gravity: 1.016 (ref 1.003–1.030)
Squamous Epithelial: 1
WBC UR: 3 /HPF (ref 0–5)

## 2014-02-12 LAB — BASIC METABOLIC PANEL
Anion Gap: 7 (ref 7–16)
BUN: 23 mg/dL — ABNORMAL HIGH (ref 7–18)
CALCIUM: 9.7 mg/dL (ref 8.5–10.1)
Chloride: 104 mmol/L (ref 98–107)
Co2: 28 mmol/L (ref 21–32)
Creatinine: 4.59 mg/dL — ABNORMAL HIGH (ref 0.60–1.30)
EGFR (Non-African Amer.): 10 — ABNORMAL LOW
GFR CALC AF AMER: 11 — AB
GLUCOSE: 55 mg/dL — AB (ref 65–99)
OSMOLALITY: 279 (ref 275–301)
POTASSIUM: 4.1 mmol/L (ref 3.5–5.1)
SODIUM: 139 mmol/L (ref 136–145)

## 2014-02-12 LAB — CBC WITH DIFFERENTIAL/PLATELET
BASOS ABS: 0.1 10*3/uL (ref 0.0–0.1)
Basophil %: 0.9 %
EOS ABS: 0.2 10*3/uL (ref 0.0–0.7)
Eosinophil %: 1.9 %
HCT: 42.5 % (ref 35.0–47.0)
HGB: 13.3 g/dL (ref 12.0–16.0)
Lymphocyte #: 2.3 10*3/uL (ref 1.0–3.6)
Lymphocyte %: 26.9 %
MCH: 26.3 pg (ref 26.0–34.0)
MCHC: 31.3 g/dL — AB (ref 32.0–36.0)
MCV: 84 fL (ref 80–100)
Monocyte #: 0.9 x10 3/mm (ref 0.2–0.9)
Monocyte %: 10.2 %
NEUTROS ABS: 5.2 10*3/uL (ref 1.4–6.5)
NEUTROS PCT: 60.1 %
Platelet: 230 10*3/uL (ref 150–440)
RBC: 5.05 10*6/uL (ref 3.80–5.20)
RDW: 19.7 % — ABNORMAL HIGH (ref 11.5–14.5)
WBC: 8.6 10*3/uL (ref 3.6–11.0)

## 2014-02-21 ENCOUNTER — Ambulatory Visit: Payer: Self-pay | Admitting: Vascular Surgery

## 2014-02-22 ENCOUNTER — Ambulatory Visit: Payer: Self-pay | Admitting: Internal Medicine

## 2014-04-07 ENCOUNTER — Ambulatory Visit: Payer: Self-pay | Admitting: Vascular Surgery

## 2014-04-07 LAB — BASIC METABOLIC PANEL
Anion Gap: 13 (ref 7–16)
BUN: 41 mg/dL — ABNORMAL HIGH (ref 7–18)
CHLORIDE: 95 mmol/L — AB (ref 98–107)
CREATININE: 7.09 mg/dL — AB (ref 0.60–1.30)
Calcium, Total: 8.3 mg/dL — ABNORMAL LOW (ref 8.5–10.1)
Co2: 24 mmol/L (ref 21–32)
EGFR (Non-African Amer.): 6 — ABNORMAL LOW
GFR CALC AF AMER: 7 — AB
Glucose: 148 mg/dL — ABNORMAL HIGH (ref 65–99)
OSMOLALITY: 277 (ref 275–301)
POTASSIUM: 4.4 mmol/L (ref 3.5–5.1)
Sodium: 132 mmol/L — ABNORMAL LOW (ref 136–145)

## 2014-04-23 ENCOUNTER — Ambulatory Visit: Payer: Self-pay | Admitting: Vascular Surgery

## 2014-05-04 ENCOUNTER — Emergency Department: Payer: Self-pay | Admitting: Emergency Medicine

## 2014-05-04 LAB — CBC WITH DIFFERENTIAL/PLATELET
BASOS ABS: 0.2 10*3/uL — AB (ref 0.0–0.1)
BASOS PCT: 1.9 %
EOS ABS: 0.2 10*3/uL (ref 0.0–0.7)
Eosinophil %: 2.4 %
HCT: 46 % (ref 35.0–47.0)
HGB: 14.4 g/dL (ref 12.0–16.0)
LYMPHS ABS: 2.6 10*3/uL (ref 1.0–3.6)
LYMPHS PCT: 28.4 %
MCH: 27.5 pg (ref 26.0–34.0)
MCHC: 31.4 g/dL — ABNORMAL LOW (ref 32.0–36.0)
MCV: 88 fL (ref 80–100)
Monocyte #: 0.7 x10 3/mm (ref 0.2–0.9)
Monocyte %: 7.9 %
Neutrophil #: 5.4 10*3/uL (ref 1.4–6.5)
Neutrophil %: 59.4 %
Platelet: 213 10*3/uL (ref 150–440)
RBC: 5.24 10*6/uL — ABNORMAL HIGH (ref 3.80–5.20)
RDW: 16.8 % — ABNORMAL HIGH (ref 11.5–14.5)
WBC: 9.1 10*3/uL (ref 3.6–11.0)

## 2014-05-04 LAB — COMPREHENSIVE METABOLIC PANEL
ALK PHOS: 176 U/L — AB
AST: 14 U/L — AB (ref 15–37)
Albumin: 3.5 g/dL (ref 3.4–5.0)
Anion Gap: 10 (ref 7–16)
BILIRUBIN TOTAL: 0.6 mg/dL (ref 0.2–1.0)
BUN: 34 mg/dL — ABNORMAL HIGH (ref 7–18)
CALCIUM: 7.7 mg/dL — AB (ref 8.5–10.1)
CO2: 26 mmol/L (ref 21–32)
CREATININE: 6.86 mg/dL — AB (ref 0.60–1.30)
Chloride: 99 mmol/L (ref 98–107)
EGFR (African American): 8 — ABNORMAL LOW
EGFR (Non-African Amer.): 6 — ABNORMAL LOW
Glucose: 174 mg/dL — ABNORMAL HIGH (ref 65–99)
OSMOLALITY: 282 (ref 275–301)
POTASSIUM: 4.5 mmol/L (ref 3.5–5.1)
SGPT (ALT): 23 U/L
Sodium: 135 mmol/L — ABNORMAL LOW (ref 136–145)
Total Protein: 7.4 g/dL (ref 6.4–8.2)

## 2014-05-04 LAB — LIPASE, BLOOD: LIPASE: 520 U/L — AB (ref 73–393)

## 2014-05-14 ENCOUNTER — Ambulatory Visit: Payer: Self-pay | Admitting: Vascular Surgery

## 2014-05-17 ENCOUNTER — Inpatient Hospital Stay: Payer: Self-pay | Admitting: Internal Medicine

## 2014-05-17 LAB — COMPREHENSIVE METABOLIC PANEL
ALBUMIN: 3.5 g/dL (ref 3.4–5.0)
ALT: 24 U/L
Alkaline Phosphatase: 204 U/L — ABNORMAL HIGH
Anion Gap: 13 (ref 7–16)
BUN: 17 mg/dL (ref 7–18)
Bilirubin,Total: 0.4 mg/dL (ref 0.2–1.0)
CALCIUM: 7.8 mg/dL — AB (ref 8.5–10.1)
CO2: 19 mmol/L — AB (ref 21–32)
Chloride: 99 mmol/L (ref 98–107)
Creatinine: 5.49 mg/dL — ABNORMAL HIGH (ref 0.60–1.30)
EGFR (African American): 10 — ABNORMAL LOW
GFR CALC NON AF AMER: 8 — AB
Glucose: 204 mg/dL — ABNORMAL HIGH (ref 65–99)
OSMOLALITY: 270 (ref 275–301)
Potassium: 4.9 mmol/L (ref 3.5–5.1)
SGOT(AST): 31 U/L (ref 15–37)
SODIUM: 131 mmol/L — AB (ref 136–145)
Total Protein: 8.6 g/dL — ABNORMAL HIGH (ref 6.4–8.2)

## 2014-05-17 LAB — URINALYSIS, COMPLETE
Bilirubin,UR: NEGATIVE
Glucose,UR: 150 mg/dL (ref 0–75)
Granular Cast: 5
Hyaline Cast: 8
KETONE: NEGATIVE
Nitrite: NEGATIVE
Ph: 5 (ref 4.5–8.0)
Protein: 100
RBC,UR: 3 /HPF (ref 0–5)
SPECIFIC GRAVITY: 1.011 (ref 1.003–1.030)
WBC UR: 10 /HPF (ref 0–5)

## 2014-05-17 LAB — PRO B NATRIURETIC PEPTIDE: B-Type Natriuretic Peptide: 1145 pg/mL — ABNORMAL HIGH (ref 0–125)

## 2014-05-17 LAB — CBC WITH DIFFERENTIAL/PLATELET
Basophil #: 0.1 10*3/uL (ref 0.0–0.1)
Basophil %: 1.1 %
EOS ABS: 0.1 10*3/uL (ref 0.0–0.7)
Eosinophil %: 1 %
HCT: 45.2 % (ref 35.0–47.0)
HGB: 13.9 g/dL (ref 12.0–16.0)
LYMPHS ABS: 2 10*3/uL (ref 1.0–3.6)
Lymphocyte %: 14.9 %
MCH: 27.3 pg (ref 26.0–34.0)
MCHC: 30.8 g/dL — ABNORMAL LOW (ref 32.0–36.0)
MCV: 89 fL (ref 80–100)
MONO ABS: 0.8 x10 3/mm (ref 0.2–0.9)
MONOS PCT: 5.7 %
Neutrophil #: 10.4 10*3/uL — ABNORMAL HIGH (ref 1.4–6.5)
Neutrophil %: 77.3 %
PLATELETS: 179 10*3/uL (ref 150–440)
RBC: 5.08 10*6/uL (ref 3.80–5.20)
RDW: 16.3 % — AB (ref 11.5–14.5)
WBC: 13.4 10*3/uL — ABNORMAL HIGH (ref 3.6–11.0)

## 2014-05-17 LAB — LIPASE, BLOOD: Lipase: 326 U/L (ref 73–393)

## 2014-05-18 LAB — BASIC METABOLIC PANEL
Anion Gap: 14 (ref 7–16)
BUN: 29 mg/dL — ABNORMAL HIGH (ref 7–18)
CO2: 21 mmol/L (ref 21–32)
Calcium, Total: 7.8 mg/dL — ABNORMAL LOW (ref 8.5–10.1)
Chloride: 96 mmol/L — ABNORMAL LOW (ref 98–107)
Creatinine: 6.03 mg/dL — ABNORMAL HIGH (ref 0.60–1.30)
EGFR (African American): 9 — ABNORMAL LOW
GFR CALC NON AF AMER: 7 — AB
Glucose: 285 mg/dL — ABNORMAL HIGH (ref 65–99)
Osmolality: 279 (ref 275–301)
POTASSIUM: 5 mmol/L (ref 3.5–5.1)
Sodium: 131 mmol/L — ABNORMAL LOW (ref 136–145)

## 2014-05-18 LAB — CBC WITH DIFFERENTIAL/PLATELET
Basophil #: 0.1 10*3/uL (ref 0.0–0.1)
Basophil %: 0.5 %
Eosinophil #: 0 10*3/uL (ref 0.0–0.7)
Eosinophil %: 0 %
HCT: 40.6 % (ref 35.0–47.0)
HGB: 12.8 g/dL (ref 12.0–16.0)
LYMPHS ABS: 1.4 10*3/uL (ref 1.0–3.6)
LYMPHS PCT: 9.7 %
MCH: 27.3 pg (ref 26.0–34.0)
MCHC: 31.5 g/dL — ABNORMAL LOW (ref 32.0–36.0)
MCV: 87 fL (ref 80–100)
MONOS PCT: 6.7 %
Monocyte #: 0.9 x10 3/mm (ref 0.2–0.9)
Neutrophil #: 11.6 10*3/uL — ABNORMAL HIGH (ref 1.4–6.5)
Neutrophil %: 83.1 %
Platelet: 203 10*3/uL (ref 150–440)
RBC: 4.69 10*6/uL (ref 3.80–5.20)
RDW: 15.5 % — ABNORMAL HIGH (ref 11.5–14.5)
WBC: 14 10*3/uL — AB (ref 3.6–11.0)

## 2014-05-18 LAB — TROPONIN I
TROPONIN-I: 0.06 ng/mL — AB
TROPONIN-I: 0.09 ng/mL — AB
Troponin-I: 0.08 ng/mL — ABNORMAL HIGH

## 2014-05-18 LAB — CK-MB
CK-MB: 2.2 ng/mL (ref 0.5–3.6)
CK-MB: 2.3 ng/mL (ref 0.5–3.6)
CK-MB: 2.4 ng/mL (ref 0.5–3.6)

## 2014-05-19 LAB — PHOSPHORUS: Phosphorus: 7.1 mg/dL — ABNORMAL HIGH (ref 2.5–4.9)

## 2014-05-20 LAB — PHOSPHORUS: PHOSPHORUS: 5.4 mg/dL — AB (ref 2.5–4.9)

## 2014-06-12 ENCOUNTER — Ambulatory Visit: Payer: Self-pay | Admitting: Vascular Surgery

## 2014-06-14 ENCOUNTER — Inpatient Hospital Stay: Payer: Self-pay | Admitting: Internal Medicine

## 2014-06-14 LAB — COMPREHENSIVE METABOLIC PANEL
ALBUMIN: 3.5 g/dL (ref 3.4–5.0)
ALT: 20 U/L
ANION GAP: 9 (ref 7–16)
AST: 29 U/L (ref 15–37)
Alkaline Phosphatase: 233 U/L — ABNORMAL HIGH
BUN: 14 mg/dL (ref 7–18)
Bilirubin,Total: 0.6 mg/dL (ref 0.2–1.0)
CO2: 28 mmol/L (ref 21–32)
Calcium, Total: 7.8 mg/dL — ABNORMAL LOW (ref 8.5–10.1)
Chloride: 99 mmol/L (ref 98–107)
Creatinine: 3.23 mg/dL — ABNORMAL HIGH (ref 0.60–1.30)
EGFR (African American): 19 — ABNORMAL LOW
EGFR (Non-African Amer.): 15 — ABNORMAL LOW
Glucose: 198 mg/dL — ABNORMAL HIGH (ref 65–99)
OSMOLALITY: 278 (ref 275–301)
POTASSIUM: 4.2 mmol/L (ref 3.5–5.1)
Sodium: 136 mmol/L (ref 136–145)
TOTAL PROTEIN: 8.2 g/dL (ref 6.4–8.2)

## 2014-06-14 LAB — URINALYSIS, COMPLETE
BLOOD: NEGATIVE
Bilirubin,UR: NEGATIVE
Glucose,UR: >=500 mg/dL (ref 0–75)
Leukocyte Esterase: NEGATIVE
Nitrite: NEGATIVE
PH: 8 (ref 4.5–8.0)
SPECIFIC GRAVITY: 1.006 (ref 1.003–1.030)
Squamous Epithelial: 3
WBC UR: 1 /HPF (ref 0–5)

## 2014-06-14 LAB — TROPONIN I: Troponin-I: <0.02 ng/mL

## 2014-06-14 LAB — CBC WITH DIFFERENTIAL/PLATELET
BASOS ABS: 0.1 10*3/uL (ref 0.0–0.1)
Basophil %: 1 %
Eosinophil #: 0.1 10*3/uL (ref 0.0–0.7)
Eosinophil %: 0.8 %
HCT: 36.7 % (ref 35.0–47.0)
HGB: 11.7 g/dL — ABNORMAL LOW (ref 12.0–16.0)
Lymphocyte #: 1.9 10*3/uL (ref 1.0–3.6)
Lymphocyte %: 18.2 %
MCH: 29.1 pg (ref 26.0–34.0)
MCHC: 32 g/dL (ref 32.0–36.0)
MCV: 91 fL (ref 80–100)
MONOS PCT: 5.7 %
Monocyte #: 0.6 x10 3/mm (ref 0.2–0.9)
NEUTROS PCT: 74.3 %
Neutrophil #: 7.6 10*3/uL — ABNORMAL HIGH (ref 1.4–6.5)
PLATELETS: 203 10*3/uL (ref 150–440)
RBC: 4.03 10*6/uL (ref 3.80–5.20)
RDW: 16.5 % — ABNORMAL HIGH (ref 11.5–14.5)
WBC: 10.2 10*3/uL (ref 3.6–11.0)

## 2014-06-14 LAB — HEMOGLOBIN
HGB: 11.5 g/dL — ABNORMAL LOW (ref 12.0–16.0)
HGB: 10.5 g/dL — AB (ref 12.0–16.0)

## 2014-06-14 LAB — LIPASE, BLOOD: Lipase: 310 U/L (ref 73–393)

## 2014-06-15 LAB — MAGNESIUM: MAGNESIUM: 1.5 mg/dL — AB

## 2014-06-15 LAB — BASIC METABOLIC PANEL
ANION GAP: 11 (ref 7–16)
BUN: 28 mg/dL — ABNORMAL HIGH (ref 7–18)
CALCIUM: 7.3 mg/dL — AB (ref 8.5–10.1)
CO2: 24 mmol/L (ref 21–32)
Chloride: 101 mmol/L (ref 98–107)
Creatinine: 4.62 mg/dL — ABNORMAL HIGH (ref 0.60–1.30)
EGFR (African American): 12 — ABNORMAL LOW
EGFR (Non-African Amer.): 10 — ABNORMAL LOW
Glucose: 234 mg/dL — ABNORMAL HIGH (ref 65–99)
Osmolality: 285 (ref 275–301)
Potassium: 4.8 mmol/L (ref 3.5–5.1)
Sodium: 136 mmol/L (ref 136–145)

## 2014-06-15 LAB — CBC WITH DIFFERENTIAL/PLATELET
BASOS ABS: 0.2 10*3/uL — AB (ref 0.0–0.1)
BASOS PCT: 1.3 %
EOS PCT: 0.1 %
Eosinophil #: 0 10*3/uL (ref 0.0–0.7)
HCT: 33.4 % — ABNORMAL LOW (ref 35.0–47.0)
HGB: 10.6 g/dL — ABNORMAL LOW (ref 12.0–16.0)
LYMPHS ABS: 1.9 10*3/uL (ref 1.0–3.6)
Lymphocyte %: 15.8 %
MCH: 28.7 pg (ref 26.0–34.0)
MCHC: 31.7 g/dL — ABNORMAL LOW (ref 32.0–36.0)
MCV: 91 fL (ref 80–100)
MONOS PCT: 9.3 %
Monocyte #: 1.1 x10 3/mm — ABNORMAL HIGH (ref 0.2–0.9)
NEUTROS PCT: 73.5 %
Neutrophil #: 8.7 10*3/uL — ABNORMAL HIGH (ref 1.4–6.5)
Platelet: 197 10*3/uL (ref 150–440)
RBC: 3.69 10*6/uL — ABNORMAL LOW (ref 3.80–5.20)
RDW: 16.2 % — ABNORMAL HIGH (ref 11.5–14.5)
WBC: 11.8 10*3/uL — ABNORMAL HIGH (ref 3.6–11.0)

## 2014-06-15 LAB — TROPONIN I

## 2014-06-15 LAB — HEMOGLOBIN A1C: HEMOGLOBIN A1C: 7.9 % — AB (ref 4.2–6.3)

## 2014-06-16 LAB — CBC WITH DIFFERENTIAL/PLATELET
BASOS PCT: 0.9 %
Basophil #: 0.1 10*3/uL (ref 0.0–0.1)
EOS ABS: 0.2 10*3/uL (ref 0.0–0.7)
EOS PCT: 1.9 %
HCT: 30.8 % — ABNORMAL LOW (ref 35.0–47.0)
HGB: 9.7 g/dL — AB (ref 12.0–16.0)
LYMPHS PCT: 27.4 %
Lymphocyte #: 2.4 10*3/uL (ref 1.0–3.6)
MCH: 28.9 pg (ref 26.0–34.0)
MCHC: 31.5 g/dL — AB (ref 32.0–36.0)
MCV: 92 fL (ref 80–100)
Monocyte #: 0.8 x10 3/mm (ref 0.2–0.9)
Monocyte %: 8.5 %
Neutrophil #: 5.4 10*3/uL (ref 1.4–6.5)
Neutrophil %: 61.3 %
Platelet: 189 10*3/uL (ref 150–440)
RBC: 3.36 10*6/uL — AB (ref 3.80–5.20)
RDW: 16 % — ABNORMAL HIGH (ref 11.5–14.5)
WBC: 8.9 10*3/uL (ref 3.6–11.0)

## 2014-06-16 LAB — RENAL FUNCTION PANEL
Albumin: 2.9 g/dL — ABNORMAL LOW (ref 3.4–5.0)
Anion Gap: 13 (ref 7–16)
BUN: 47 mg/dL — ABNORMAL HIGH (ref 7–18)
Calcium, Total: 7.2 mg/dL — ABNORMAL LOW (ref 8.5–10.1)
Chloride: 100 mmol/L (ref 98–107)
Co2: 22 mmol/L (ref 21–32)
Creatinine: 7.6 mg/dL — ABNORMAL HIGH (ref 0.60–1.30)
EGFR (African American): 7 — ABNORMAL LOW
GFR CALC NON AF AMER: 6 — AB
Glucose: 226 mg/dL — ABNORMAL HIGH (ref 65–99)
Osmolality: 289 (ref 275–301)
POTASSIUM: 4.8 mmol/L (ref 3.5–5.1)
Phosphorus: 5.4 mg/dL — ABNORMAL HIGH (ref 2.5–4.9)
Sodium: 135 mmol/L — ABNORMAL LOW (ref 136–145)

## 2014-06-17 ENCOUNTER — Ambulatory Visit: Payer: Self-pay | Admitting: Neurology

## 2014-06-17 LAB — CBC WITH DIFFERENTIAL/PLATELET
Basophil #: 0.1 10*3/uL (ref 0.0–0.1)
Basophil %: 0.6 %
Eosinophil #: 0.2 10*3/uL (ref 0.0–0.7)
Eosinophil %: 2.5 %
HCT: 29.6 % — ABNORMAL LOW (ref 35.0–47.0)
HGB: 9.5 g/dL — ABNORMAL LOW (ref 12.0–16.0)
LYMPHS ABS: 2.5 10*3/uL (ref 1.0–3.6)
Lymphocyte %: 30.5 %
MCH: 29.4 pg (ref 26.0–34.0)
MCHC: 32 g/dL (ref 32.0–36.0)
MCV: 92 fL (ref 80–100)
Monocyte #: 1 x10 3/mm — ABNORMAL HIGH (ref 0.2–0.9)
Monocyte %: 12.4 %
Neutrophil #: 4.4 10*3/uL (ref 1.4–6.5)
Neutrophil %: 54 %
Platelet: 173 10*3/uL (ref 150–440)
RBC: 3.23 10*6/uL — ABNORMAL LOW (ref 3.80–5.20)
RDW: 16.2 % — ABNORMAL HIGH (ref 11.5–14.5)
WBC: 8.1 10*3/uL (ref 3.6–11.0)

## 2014-06-17 LAB — PROTIME-INR
INR: 1
PROTHROMBIN TIME: 13.2 s (ref 11.5–14.7)

## 2014-11-14 ENCOUNTER — Other Ambulatory Visit: Payer: Self-pay | Admitting: Pediatrics

## 2014-11-14 DIAGNOSIS — Z1239 Encounter for other screening for malignant neoplasm of breast: Secondary | ICD-10-CM

## 2014-11-14 NOTE — H&P (Signed)
PATIENT NAME:  Cynthia Dean, Cynthia Dean MR#:  [id number] DATE OF BIRTH:  1951/01/09  DATE OF ADMISSION:  09/14/2012  PRIMARY CARE PHYSICIAN:  Dr. Marilynn Rail.  REFERRING PHYSICIAN:  Dr. Reita Cliche.   CHIEF COMPLAINT:  Abdominal pain, nausea, vomiting today.   HISTORY OF PRESENT ILLNESS:  The patient is a 64 year old African American female with a history of hypertension, diabetes, CKD, presented to the ED with abdominal pain and nausea, vomiting today.  The patient is alert, awake, oriented, in no acute distress.  The patient said she developed right side flank pain today with nausea, vomiting multiple times.  The right side abdominal pain is intermittent, dull, 6/10 and related to movement.  In addition, the patient has a fever, chills, cold, cough with yellowish sputum and shortness of breath.  The patient was noted to have tachycardia.  Heart rate was about 120, was treated with normal saline 3 liters in ED.  The patient's chest x-ray showed right lower lobe pneumonia, was treated with Levaquin.  The patient denies any chest pain, palpitations, orthopnea or nocturnal dyspnea.  No leg edema.  No weight gain.  No weight loss.   PAST MEDICAL HISTORY:  Hypertension, diabetes, CKD with a baseline creatinine about 2 to 3, chronic back pain, diabetic retinopathy, obesity, diastolic congestive heart failure, anemia of chronic disease, history of upper GI bleeding.   PAST SURGICAL HISTORY:  Right breast biopsy, colonoscopy and endoscopy.   SOCIAL HISTORY:  The patient quit smoking three years ago.  No alcohol drinking or illicit drugs.   FAMILY HISTORY:  Mother died of a stroke.  Also, mother had diabetes and heart attack.   ALLERGIES:  HYDROCODONE, PERCOCET.   HOME MEDICATIONS:   1.  Vitamin D 50,000 international units 1 cap twice a week. 2.  Tylenol 325 mg 2 tablets every 4 hours as needed.  3.  Phenergan 25 mg 1 to 2 tablets every 4 to 6 hours as needed.  4.  NovoLog mix 70/30 40 units twice daily subQ.  5.   Lopressor 50 mg by mouth twice daily.  6.  Hydralazine 100 mg by mouth 3 times daily.  7.  Gabapentin 300 mg 3 cap 3 times daily.    8.  Lasix 40 mg by mouth twice daily.  9.  Ferrous sulfate 325 mg by mouth twice daily.  10.  DuoNeb 0.5 mg/2.5 mg 3 mL inhaled twice daily.  11.  Dilaudid 2 mg 1 tablet by mouth q. 4 hours as needed.  12.  Diazepam 5 mg by mouth every 4 hours as needed.  13.  Colace 100 mg by mouth once a day as needed.  14.  Aspirin 81 mg by mouth daily.  15.  Norvasc 10 mg by mouth daily.  16.  Amitriptyline 10 mg 2 tablets once a day at bedtime as needed for anxiety.  17.  Advair 250 mcg/50 mcg inhalation powder 1 puff twice daily.   REVIEW OF SYSTEMS:  CONSTITUTIONAL:  Patient has a fever, chills, but has no headache or dizziness.  The patient has generalized weakness.  EYES:  No double vision or blurred vision.  EARS, NOSE, THROAT:  No postnasal drip, slurred speech or dysphagia.   CARDIOVASCULAR:  No chest pain, palpitations, orthopnea or nocturnal dyspnea.  No leg edema.  PULMONARY:  Positive for cough, sputum, shortness of breath.  No wheezing or hemoptysis.  GASTROINTESTINAL:  Positive for abdominal pain, nausea, vomiting, but no diarrhea, melena or bloody stool.  GENITOURINARY:  No dysuria,  hematuria or incontinence.  SKIN:  No rash or jaundice.  NEUROLOGIC:  No syncope, loss of consciousness or seizure.  ENDOCRINE:  No polyuria, polydipsia, heat or cold intolerance.  HEMATOLOGIC:  No easy bruising, bleeding.   PHYSICAL EXAMINATION: VITAL SIGNS:  Temperature 97.9, blood pressure initially 208/88, and now it is 158/76, pulse of 114, O2 saturation 94% on oxygen 2 liters by nasal cannula.  GENERAL:  The patient is alert, awake, oriented, in no acute distress.  HEENT:  Pupils round, equal, reactive to light and accommodation.  Moist oral mucosa.  Clear oropharynx.  NECK:  Supple.  No JVD or carotid bruits.  No lymphadenopathy.  No thyromegaly.  CARDIOVASCULAR:   S1, S2, regular rate and rhythm.  No murmurs or gallops.  PULMONARY:  Bilateral air entry.  Right side, mild expiratory wheezing.  No crackles or rales.  ABDOMEN:  Soft, obese.  No distention or tenderness.  No organomegaly.  Bowel sounds present.  EXTREMITIES:  No edema, clubbing or cyanosis.  No calf tenderness.  Bilateral pulses present.  SKIN:  No rash or jaundice.  NEUROLOGY:  A and O x 3.  No focal deficit.  Power 5 out of 5.  Sensation intact.   LABORATORY DATA:  Chest x-ray showed pneumonia on the right lower lobe with increased interstitial markings and enlargement of cardiac with pulmonary vascular engorgement consistent with congestive heart failure.  Urinalysis is negative.  Influenza A and B is negative.  Abdomen and pelvis CAT scan showed no evidence of obstruction.  No inflammatory abnormality.  WBC 15.7, hemoglobin 11.7, platelet 293.  Lipase 243, glucose 228, BUN 41, creatinine 2.62, sodium 138, potassium of 4.2, chloride 106, bicarb 22.   IMPRESSION: 1.  Right lower lobe pneumonia.  2.  Systemic inflammatory response syndrome. 3.  Accelerated hypertension.  4.  Chronic kidney disease stage 4.  5.  Anemia of chronic disease.  6.  Congestive heart failure, diastolic dysfunction, stable.  7.  Diabetes.  8.  Diabetic neuropathy.  9.  Obesity.   PLAN OF TREATMENT: 1.  The patient will be admitted to medical floor.  We will continue Levaquin.  Start Xopenex nebulizer as needed.  Follow up CBC, blood culture, sputum culture.  2.  We will hold IV fluid due to history of CHF and follow up BMP.  3.  We will continue hypertension medication including Lopressor, hydralazine, Norvasc, Lasix.  4.  For diabetes we will start sliding scale and continue Humalog 75/25 40 units twice daily.  5.  Gastrointestinal and deep vein thrombosis prophylaxis.   I discussed the patient's condition and the plan of treatment with the patient and the patient's husband.  Also discussed the patient's CODE  STATUS.  THE PATIENT IS A FULL CODE.   TIME SPENT:  About 62 minutes.    ____________________________ Demetrios Loll, MD qc:ea D: 09/14/2012 23:06:10 ET T: 09/15/2012 01:10:19 ET JOB#: [id number]  cc: Demetrios Loll, MD, <Dictator> Demetrios Loll MD ELECTRONICALLY SIGNED 09/16/2012 14:25

## 2014-11-14 NOTE — Consult Note (Signed)
Chief Complaint:  Subjective/Chief Complaint seen for gastroenteritis, abdominal pain.  Patient much improved, tolerating clears, minimal to no abdominal discomfort   VITAL SIGNS/ANCILLARY NOTES: **Vital Signs.:   20-Oct-14 18:59  Vital Signs Type Routine  Temperature Temperature (F) 99.5  Celsius 37.5  Temperature Source oral  Pulse Pulse 79  Respirations Respirations 20  Systolic BP Systolic BP 542  Diastolic BP (mmHg) Diastolic BP (mmHg) 57  Mean BP 78  Pulse Ox % Pulse Ox % 97  Pulse Ox Activity Level  At rest  Oxygen Delivery 4L   Brief Assessment:  Cardiac Regular   Respiratory clear BS   Gastrointestinal details normal Soft  Nontender  Nondistended  Bowel sounds normal  mild discomfort in the upper epigastrum   Lab Results: Hepatic:  20-Oct-14 04:16   Bilirubin, Total 0.4  Alkaline Phosphatase 132  SGPT (ALT) 16  SGOT (AST) 17  Total Protein, Serum 7.3  Albumin, Serum  2.9  Lab:  20-Oct-14 04:45   O2 Saturation (Pulse Ox)  72  FiO2 (Pulse Ox) RA (Result(s) reported on 13 May 2013 at 05:23AM.)  Routine Chem:  20-Oct-14 04:16   Glucose, Serum  205  Creatinine (comp)  3.48  Sodium, Serum 140  Potassium, Serum 5.0  Chloride, Serum  110  CO2, Serum 22  Calcium (Total), Serum 9.1  Osmolality (calc) 298  eGFR (African American)  15  eGFR (Non-African American)  13 (eGFR values <49m/min/1.73 m2 may be an indication of chronic kidney disease (CKD). Calculated eGFR is useful in patients with stable renal function. The eGFR calculation will not be reliable in acutely ill patients when serum creatinine is changing rapidly. It is not useful in  patients on dialysis. The eGFR calculation may not be applicable to patients at the low and high extremes of body sizes, pregnant women, and vegetarians.)  Anion Gap 8  Routine Hem:  20-Oct-14 04:16   WBC (CBC)  15.7  RBC (CBC) 4.35  Hemoglobin (CBC)  11.3  Hematocrit (CBC)  34.9  Platelet Count (CBC) 238  MCV 80   MCH  25.9  MCHC 32.4  RDW  18.5  Neutrophil % 86.2  Lymphocyte % 8.1  Monocyte % 5.5  Eosinophil % 0.0  Basophil % 0.2  Neutrophil #  13.5  Lymphocyte # 1.3  Monocyte # 0.9  Eosinophil # 0.0  Basophil # 0.0 (Result(s) reported on 13 May 2013 at 05:43AM.)   Assessment/Plan:  Assessment/Plan:  Assessment 1) acute gastroenteritis with abdominal pain associated with recurrent emesis, resolved   Plan as above, will sign off.   Electronic Signatures: SLoistine Simas(MD)  (Signed 20-Oct-14 21:41)  Authored: Chief Complaint, VITAL SIGNS/ANCILLARY NOTES, Brief Assessment, Lab Results, Assessment/Plan   Last Updated: 20-Oct-14 21:41 by SLoistine Simas(MD)

## 2014-11-14 NOTE — Consult Note (Signed)
PATIENT NAME:  Cynthia Dean, Cynthia Dean MR#:  [id number] DATE OF BIRTH:  08-Nov-1950  DATE OF CONSULTATION:  05/12/2013  CONSULTING PHYSICIAN:  Lollie Sails, MD  Patient of Dr. Benjie Karvonen.    REASON FOR CONSULTATION: Abdominal pain.   HISTORY OF PRESENT ILLNESS: The patient interview was a little difficult because she had been fairly heavily sedated and given pain medications both in the ER and the floor before I was able to see her.  Cynthia Dean is a 64 year old female who was in her usual state of health until yesterday. She stated that she went to a what sounds like to be a catered dinner yesterday at about noon. There she had some chicken and other items. She began feeling bad at about 2:30 in the afternoon followed by fever and chills and some nausea over the course of the day. Toward the evening, she is feeling a little bit better and she went to bed. However, this morning she awoke with further nausea and emesis multiple, episodes of dry heaves. Her abdomen began to hurt more and she came to the Emergency Room at about 10:00. There has been no diarrhea. She continues with some upper epigastric pain. She has had no previous similar episodes. She does have a history of gastroesophageal reflux and has taken omeprazole for "years." She has no dysphagia. There is no personal history of peptic ulcer disease. She has had a colonoscopy in the past. In review of charts did have an EGD done on 11/19/2008 for hematemesis. At that time he was found to have a grade A esophagitis and some gastritis. She was told to continue use of a proton pump inhibitor. She had a colonoscopy in July 2009 for problems of chronic diarrhea. This appeared normal. There were no biopsies done on EGD and biopsies of the colonic lining were normal. She states that she usually has a daily bowel movement. She has seen no black stools, blood in the stools, or slimy stools.   PAST MEDICAL HISTORY: The patient is a history of obesity, type 2  diabetes, chronic obstructive pulmonary disease, benign hypertension, history of congestive heart failure, gastroesophageal reflux, chronic renal insufficiency as well as a previous history of pneumonia. Her blood pressure was quite elevated in the ER. This was 210/75.   REVIEW OF SYSTEMS: Ten systems were reviewed on the admission history and physical, agree with same.   OUTPATIENT MEDICATIONS: Include Advair Diskus 250/50, albuterol ipratropium 100 mcg/20 mcg, amitriptyline 10 mg two at bedtime, amlodipine 10 mg once a day, aspirin 81 mg once a day, Colace 100 mg once a day, diazepam 5 mg every 4 hours, Echinacea 1 tablet 3 times a day, ferrous sulfate 325 mg once a day, Lasix 40 mg twice a day, gabapentin 300 mg 3 capsules 3 times a day, hydralazine 100 mg twice a day, metoprolol 50 mg, one every 12 hours, NovoLog 70/30 15 units and twice a day and vitamin D2.   ALLERGIES: SHE IS ALLERGIC TO HYDROCODONE AND PERCOCET.   PHYSICAL EXAMINATION: VITAL SIGNS: Temperature 98.7, pulse 96, respirations 20, blood pressure 191/67, pulse oximetry 98%.  GENERAL: She is a 64 year old female in no acute distress.  HEENT: Normocephalic, atraumatic.  Eyes are anicteric.  Nose:  Septum midline. No lesions.  Oropharynx:  No lesions.  NECK: No JVD.  HEART: Regular rate and rhythm.  LUNGS: Bilaterally clear.  ABDOMEN: Soft. There is some point tenderness at the base of the sternum. The abdomen is otherwise nontender to mild palpation.  There are no masses. There is no rebound. The patient is morbidly obese.  EXTREMITIES: No clubbing or cyanosis.   LABORATORY, DIAGNOSTIC, AND RADIOLOGICAL DATA: Include the following: On coming to the hospital she had a glucose 181, BUN 40, creatinine 2.87, sodium 139, potassium 3.7, chloride 108, bicarbonate 22, calcium 9.6, lipase 298. Hepatic profile showing a total protein of 8.3, serum albumin of 3.5, total bilirubin 0.5, alkaline phosphatase 154, AST 13, ALT 19. Troponin I is  less than 0.02. Hemogram showing white count of 12.2,  hemoglobin and hematocrit of 12.8/39.4, platelet count 241, MCV 79.   She had a CT scan of the abdomen and pelvis without contrast showing no acute abnormalities. There was some scattered atherosclerosis.   She had a portable chest film showing borderline cardiomegaly with some pulmonary edema, this being characterized as mild. She had an abdominal ultrasound with some mild hepatomegaly, likely fatty infiltration. The pancreatic duct was 2.5 mm. The common bile duct was 4.2 mm. No findings of acute cholecystitis.   ASSESSMENT:   The patient is presenting with upper epigastric pain in the setting of recurrent emesis. She likely has encountered a gastroenteritis and the recurrent nature of her emesis and later on dry heaves has markedly irritated or inflamed the regions around the lower sternum and musculature in the epigastric region. Imaging studies or uninformative. The patient is currently sedated and comfortable. Her blood pressure is appropriate at this point. She is currently on Zosyn.   RECOMMENDATION:  Continue current management. We will reassess tomorrow. We will follow with you.     ____________________________ Lollie Sails, MD mus:cc D: 05/12/2013 19:59:38 ET T: 05/12/2013 20:53:33 ET JOB#: [id number]  cc: Lollie Sails, MD, <Dictator> Lollie Sails MD ELECTRONICALLY SIGNED 05/25/2013 15:43

## 2014-11-14 NOTE — H&P (Signed)
PATIENT NAME:  Cynthia Dean, BEEGHLY MR#:  [id number] DATE OF BIRTH:  1951-07-02  DATE OF ADMISSION:  05/12/2013  REFERRING PHYSICIAN: Dr. Corky Downs.   FAMILY PHYSICIAN: Dr. Mammie Lorenzo.   REASON FOR ADMISSION: Abdominal pain.   HISTORY OF PRESENT ILLNESS: The patient is a 64 year old female with a long-standing history of obesity, renal insufficiency, COPD, hypertension, and diabetes. He presents to the Emergency Room with a two-day history of severe abdominal pain associated with nausea and vomiting. No diarrhea. In the Emergency Room, the patient was noted to be extremely hypertensive with a mild elevation of her white count. CT of the abdomen was negative. She was also noted to be dehydrated in acute renal failure. She is now admitted for further evaluation.   PAST MEDICAL HISTORY: 1. Obesity.  2. Type 2 diabetes.  3. COPD.  4. Benign hypertension.  5. History of congestive heart failure.  6. Chronic renal insufficiency.  7. Gastroesophageal reflux disease.  8. History of pneumonia.   MEDICATIONS: 1. Vitamin D2 50,000 units p.o. 2 times per week.  2. NovoLog 70/30 mix 40 units subcutaneous b.i.d.  3. Lopressor 50 mg p.o. b.i.d.  4. Hydralazine 100 mg p.o. t.i.d.  5. Neurontin 900  mg p.o. t.i.d.  6. Combivent 1 puff 4 times daily.  7. Lasix 40 mg p.o. b.i.d.  8. Iron sulfate 325 mg p.o. b.i.d.  9. Aspirin 81 mg p.o. daily.  10. Norvasc 10 mg p.o. daily.  11. Elavil 20 mg p.o. at bedtime.  12. Advair 250/50 1 puff b.i.d.   ALLERGIES: HYDROCODONE AND PERCOCET.   SOCIAL HISTORY: The patient has a history of tobacco abuse but not recently. No history of alcohol abuse.   FAMILY HISTORY: Positive for diabetes, stroke, coronary artery disease, hypertension and cancer of unknown primary.   REVIEW OF SYSTEMS:  CONSTITUTIONAL: No fever or change in weight.  EYES: No blurred or double vision. No glaucoma.  ENT: No tinnitus or hearing loss. No nasal discharge or bleeding. No  difficulty swallowing.  RESPIRATORY: No cough or wheezing. Denies hemoptysis. No painful respiration.  CARDIOVASCULAR: No chest pain or orthopnea. No palpitations or syncope.  GASTROINTESTINAL: No diarrhea. No change in bowel habits.  GENITOURINARY: No dysuria or hematuria. No incontinence.  ENDOCRINE: No polyuria or polydipsia. No heat or cold intolerance.  HEMATOLOGIC: The patient denies anemia, easy bruising, or bleeding.  LYMPHATIC: No swollen glands.  MUSCULOSKELETAL: The patient denies pain in her neck, back, shoulders, knees, or hips. No gout.  NEUROLOGIC: No numbness or migraines. Denies stroke or seizures.  PSYCHIATRIC: The patient denies anxiety, insomnia or depression.   PHYSICAL EXAMINATION: GENERAL: The patient is in no acute distress.  VITAL SIGNS: Currently remarkable for a blood pressure of 210/75, with a heart rate of 92 and a respiratory rate of 18. She is afebrile.  HEENT: Normocephalic, atraumatic. Pupils equally round and reactive to light and accommodation. Extraocular movements are intact. Sclerae are nonicteric. Conjunctivae are clear.  OROPHARYNX: Dry but clear.  NECK: Supple without JVD or bruits. No adenopathy or thyromegaly is noted.   LUNGS: Clear to auscultation and percussion without wheezes, rales, or rhonchi. No dullness. Respiratory effort is normal.  CARDIAC: Regular rate and rhythm with normal S1, S2. No significant rubs, murmurs, or gallops. PMI is nondisplaced. Chest wall is nontender.  ABDOMEN: Soft but diffusely tender, most notable in the epigastrium. There is guarding, but no rebound. Normoactive bowel sounds. No organomegaly or masses were appreciated. No hernias or bruits were noted.  EXTREMITIES: Without clubbing, cyanosis, edema. Pulses were 2+ bilaterally.  SKIN: Warm and dry without rash or lesions.  NEUROLOGIC: Cranial nerves II through XII grossly intact. Deep tendon reflexes were symmetric. Motor and sensory exam is nonfocal.  PSYCHIATRIC:  Exam revealed a patient who is alert and oriented to person, place, and time. She was cooperative and used good judgment.   LABORATORY DATA: Glucose is 181 with a BUN of 40, creatinine of 2.87, with a GFR of 20. Troponin was less than 0.02. Transaminases were normal. Lipase was 298. White count was 12.2, with a hemoglobin of 12.8. Urinalysis was negative. Chest x-ray revealed cardiomegaly with pulmonary vascular congestion. CT of the abdomen revealed no acute abnormality.   ASSESSMENT: 1. Severe abdominal pain, presumed gastritis.  2. Acute renal failure.  3. Dehydration.  4. Accelerated hypertension.  5. Type 2 diabetes mellitus.  6. Obesity.  7. Chronic obstructive pulmonary disease.   PLAN: The patient will be admitted to the floor with IV fluids and empiric IV antibiotics. We will begin IV Protonix as well. She will be kept n.p.o. except for ice chips and medications. We will consult GI for possible endoscopy. We will guaiac all stools. We will hold her routine insulin at this time and follow her sugars with Accu-Cheks before meals and at bedtime and add sliding scale insulin as needed. We will continue her pulmonary regimen at this time. Follow up routine labs in the morning. We will use Dilaudid as needed for pain and Zofran as needed for nausea. Vital signs every four hours. Further treatment and evaluation will depend upon the patient's progress.   TOTAL TIME SPENT ON THIS PATIENT: 50 minutes.    ____________________________ Leonie Douglas Doy Hutching, MD jds:sg D: 05/12/2013 12:30:39 ET T: 05/12/2013 12:50:14 ET JOB#: [id number]  cc: Leonie Douglas. Doy Hutching, MD, <Dictator> Dr. Lorain Childes MD ELECTRONICALLY SIGNED 05/13/2013 8:13

## 2014-11-14 NOTE — Consult Note (Signed)
CHIEF COMPLAINT and HISTORY:  Subjective/Chief Complaint renal insufficiency   History of Present Illness Patient is a 64 yo AAF with CKD who presented with worsening renal insufficiency.  Did not need HD on this admission, but her renal function has worsened and is likely to progress to dialysis over several months.  We are asked to see her to discuss dialysis access options.  She has no current uremic symptoms prompting initiation of dialysis.  She is breathing comfortably.  Her K is OK.  She has not yet had any dialysis treatments, but understands that this will likely be necessary in the not too distant future.   PAST MEDICAL/SURGICAL HISTORY:  Past Medical History:   Pneumonia: 14-Sep-2012   GERD - Esophageal Reflux:    Kidney Failure:    CHF:    COPD:    Hypertension:    Diabetes:    breast biopsy:   ALLERGIES:  Allergies:  Hydrocodone: N/V  Percocet: N/V  HOME MEDICATIONS:  Home Medications: Medication Instructions Status  hydrALAZINE 100 mg oral tablet 1 tab(s) orally 2 times a day Active  metoprolol tartrate 50 mg oral tablet 1 tab(s) orally every 12 hours Active  albuterol-ipratropium CFC free 100 mcg-20 mcg/inh inhalation aerosol 1 puff(s) inhaled 4 times a day Active  Vitamin D2 50,000 intl units oral capsule 1 cap(s) orally 2 times a week on Wednesday and Friday Active  Advair Diskus 250 mcg-50 mcg inhalation powder 1 puff(s) inhaled 2 times a day Active  diazepam 5 mg oral tablet 1 tab(s) orally every 4 hours, As Needed- for Agitation , for Anxiety, Nervousness  Active  Colace 100 mg oral capsule 1 cap(s) orally once a day, As Needed- for Constipation  Active  gabapentin 300 mg oral capsule 3 cap(s) orally 3 times a day Active  amitriptyline 10 mg oral tablet 2 tab(s) orally once a day (at bedtime), As Needed- for Anxiety, Nervousness  Active  aspirin 81 mg oral enteric coated tablet 1 tab(s) orally once a day Active  ferrous sulfate 325 mg oral tablet 1  tab(s) orally 2 times a day Active  amlodipine 10 mg oral tablet 1 tab(s) orally once a day Active  furosemide 40 mg oral tablet 1 tab(s) orally 2 times a day Active  NovoLog Mix 70/30 subcutaneous suspension 15 unit(s) subcutaneous 2 times a day if fsbs greater 156. Active  Echinacea - oral tablet 1 tab(s) orally 3 times a day, As Needed Active   Family and Social History:  Family History Hypertension  Diabetes Mellitus  Stroke   Social History positive tobacco (Greater than 1 year), negative ETOH   Place of Living Home   Review of Systems:  Fever/Chills No   Cough No   Sputum No   Abdominal Pain Yes   Diarrhea No   Constipation No   Nausea/Vomiting Yes   SOB/DOE Yes   Chest Pain No   Telemetry Reviewed NSR   Dysuria No   Tolerating PT Yes   Tolerating Diet Yes   Medications/Allergies Reviewed Medications/Allergies reviewed   Physical Exam:  GEN well developed, well nourished, obese   HEENT pink conjunctivae, moist oral mucosa   NECK No masses  trachea midline   RESP normal resp effort  no use of accessory muscles   CARD regular rate  no JVD   VASCULAR ACCESS none.  allen's test normal.   ABD denies tenderness  normal BS   GU no superpubic tenderness   LYMPH negative neck, negative axillae  EXTR negative cyanosis/clubbing, positive edema   SKIN normal to palpation, skin turgor good   NEURO cranial nerves intact, motor/sensory function intact   PSYCH alert, A+O to time, place, person   LABS:  Laboratory Results: Hepatic:    19-Oct-14 07:43, Comprehensive Metabolic Panel  Bilirubin, Total 0.5  Alkaline Phosphatase 154  SGPT (ALT) 19  SGOT (AST) 13  Total Protein, Serum 8.3  Albumin, Serum 3.5    20-Oct-14 04:16, Comprehensive Metabolic Panel  Bilirubin, Total 0.4  Alkaline Phosphatase 132  SGPT (ALT) 16  SGOT (AST) 17  Total Protein, Serum 7.3  Albumin, Serum 2.9    25-Oct-14 00:29, Renal Function Panel  Albumin, Serum 3.0   Routine Micro:    24-Oct-14 13:49, Clostridium Diff Toxin by RT-PCR  Micro Text Report   CLOS.DIFF ASSAY, RT-PCR    COMMENT                   NEGATIVE-CLOS.DIFFICILE TOXIN NOT DETECTED BY PCR     ANTIBIOTIC  Comment  1.   NEGATIVE-CLOS.DIFFICILE TOXIN NOT DETECTED BY PCR ----------------------------------  Test procedure integrates sample purification, nucleic acid  amplification, and detection of the target Clostridium difficile  sequence in simple or complex samplesusing real-time PCR and  RT-PCR assays.  Lab:    20-Oct-14 04:45, Pulse Oximetry  O2 Saturation (Pulse Ox) 72  FiO2 (Pulse Ox) RA  Result(s) reported on 13 May 2013 at 05:23AM.  General Ref:    25-Oct-14 00:29, Complement C3, Serum  Complement C3, Serum   ========== TEST NAME ==========  ========= RESULTS =========  = REFERENCE RANGE =    COMPLEMENT C3, SERUM    Complement C3, Serum  Complement C3, Serum            [   136 mg/dL Adult      ]            90-180                  [facility]    No: [id number]            [address] [place], [place] [address]            Lindon Romp, MD         [phone]     Result(s) reported on 20 May 2013 at 03:48AM.    25-Oct-14 00:29, Complement C4  Complement C4   ========== TEST NAME ==========  ========= RESULTS =========  = REFERENCE RANGE =    COMPLEMENT C4    Complement C4, Serum  Complement C4, Serum            [   24 mg/dL Adult       ]              [address]                  [facility]            No: [id number]            [address] [place], [place] [address]            Lindon Romp, MD         [phone]     Result(s) reported on 20 May 2013 at 03:48AM.    25-Oct-14 00:29, Free Kappa and Lambda Light Ch.qt,ratio  Free Kappa and Lambda Light Ch.qt,ratio   ========== TEST NAME ==========  ========= RESULTS =========  = REFERENCE RANGE =  Kappa/Lambda Free Light    Free K+L Lt Chains,Qn,S  Free Kappa Lt Chains,S           [H  21.61 mg/L           ]        3.30-19.40  Free Lambda Lt Chains,S         [H32.72 mg/L           ]        5.71-26.30  Kappa/Lambda Ratio,S            [   0.66                 ]         0.26-1.65                  [facility]            No: [id number]            [address], [place], [place] [address]            Lindon Romp, MD         [phone]     Result(s) reported on 19 May 2013 at 06:48PM.    25-Oct-14 00:29, Protein Electrophoresis, Serum  Protein Electrophoresis, Serum   ========== TEST NAME ==========  ========= RESULTS =========  = REFERENCE RANGE =    PROTEIN ELECTROPHORESIS  Cardiology:    23-Oct-14 21:30, ECG  Ventricular Rate 70  Atrial Rate 70  P-R Interval 162  QRS Duration 94  QT 414  QTc 447  P Axis 66  R Axis 32  T Axis 24  ECG interpretation   Normal sinus rhythm  Normal ECG  When compared with ECG of 31-Aug-2011 10:46,  No significant change was found  Confirmed by Fletcher Anon, MUHAMMAD ([id number]) on 05/17/2013 10:02:59 AM    Overreader: Kathlyn Sacramento  ECG   Routine Chem:    19-Oct-14 07:43, Comprehensive Metabolic Panel  Glucose, Serum 181  BUN 40  Creatinine (comp) 2.87  Sodium, Serum 139  Potassium, Serum 3.7  Chloride, Serum 108  CO2, Serum 22  Calcium (Total), Serum 9.6  Osmolality (calc) 292  eGFR (African American) 20  eGFR (Non-African American) 17  eGFR values <74m/min/1.73 m2 may be an indication of chronic  kidney disease (CKD).  Calculated eGFR is useful in patients with stable renal function.  The eGFR calculation will not be reliable in acutely ill patients  when serum creatinine is changing rapidly. It is not useful in   patients on dialysis. The eGFR calculation may not be applicable  to patients at the low and high extremes of body sizes, pregnant  women, and vegetarians.  Anion Gap 9    19-Oct-14 07:43, Lipase  Lipase 298  Result(s) reported on 12 May 2013 at 08:19AM.    20-Oct-14 04:16,  Comprehensive Metabolic Panel  Glucose, Serum 205  BUN 47  Creatinine (comp) 3.48  Sodium, Serum 140  Potassium, Serum 5.0  Chloride, Serum 110  CO2, Serum 22  Calcium (Total), Serum 9.1  Osmolality (calc) 298  eGFR (African American) 15  eGFR (Non-African American) 13  eGFR values <629mmin/1.73 m2 may be an indication of chronic  kidney disease (CKD).  Calculated eGFR is useful in patients with stable renal function.  The eGFR calculation will not be reliable in acutely ill patients  when serum creatinine is changing rapidly. It is not useful in   patients on dialysis. The eGFR calculation  may not be applicable  to patients at the low and high extremes of body sizes, pregnant  women, and vegetarians.  Anion Gap 8    22-Oct-14 97:02, Basic Metabolic Panel (w/Total Calcium)  Glucose, Serum 270  BUN 65  Creatinine (comp) 4.79  Sodium, Serum 131  Potassium, Serum 4.7  Chloride, Serum 103  CO2, Serum 19  Calcium (Total), Serum 9.1  Anion Gap 9  Osmolality (calc) 291  eGFR (African American) 11  eGFR (Non-African American) 9  eGFR values <52m/min/1.73 m2 may be an indication of chronic  kidney disease (CKD).  Calculated eGFR is useful in patients with stable renal function.  The eGFR calculation will not be reliable in acutely ill patients  when serum creatinine is changing rapidly. It is not useful in   patients on dialysis. The eGFR calculation may not be applicable  to patients at the low and high extremes of body sizes, pregnant  women, and vegetarians.    23-Oct-14 063:78 Basic Metabolic Panel (w/Total Calcium)  Glucose, Serum 227  BUN 68  Creatinine (comp) 4.46  Sodium, Serum 132  Potassium, Serum 4.7  Chloride, Serum 103  CO2, Serum 21  Calcium (Total), Serum 9.3  Anion Gap 8  Osmolality (calc) 291  eGFR (African American) 11  eGFR (Non-African American) 10  eGFR values <664mmin/1.73 m2 may be an indication of chronic  kidney disease (CKD).  Calculated  eGFR is useful in patients with stable renal function.  The eGFR calculation will not be reliable in acutely ill patients  when serum creatinine is changing rapidly. It is not useful in   patients on dialysis. The eGFR calculation may not be applicable  to patients at the low and high extremes of body sizes, pregnant  women, and vegetarians.    24-Oct-14 0658:85Basic Metabolic Panel (w/Total Calcium)  Glucose, Serum 316  BUN 73  Creatinine (comp) 4.00  Sodium, Serum 130  Potassium, Serum 4.9  Chloride, Serum 102  CO2, Serum 20  Calcium (Total), Serum 9.1  Anion Gap 8  Osmolality (calc) 294  eGFR (African American) 13  eGFR (Non-African American) 11  eGFR values <6090min/1.73 m2 may be an indication of chronic  kidney disease (CKD).  Calculated eGFR is useful in patients with stable renal function.  The eGFR calculation will not be reliable in acutely ill patients  when serum creatinine is changing rapidly. It is not useful in   patients on dialysis. The eGFR calculation may not be applicable  to patients at the low and high extremes of body sizes, pregnant  women, and vegetarians.    25-Oct-14 00:29, Renal Function Panel  Glucose, Serum 362  BUN 77  Creatinine (comp) 4.28  Sodium, Serum 131  Potassium, Serum 5.2  Chloride, Serum 101  CO2, Serum 20  Calcium (Total), Serum 8.9  Phosphorus, Serum 4.6  Anion Gap 10  Osmolality (calc) 300  eGFR (African American) 12  eGFR (Non-African American) 10  eGFR values <87m15mn/1.73 m2 may be an indication of chronic  kidney disease (CKD).  Calculated eGFR is useful in patients with stable renal function.  The eGFR calculation will not be reliable in acutely ill patients  when serum creatinine is changing rapidly. It is not useful in   patients on dialysis. The eGFR calculation may not be applicable  to patients at the low and high extremes of body sizes, pregnant  women, and vegetarians.    26-Oct-14 05:502:77sic Metabolic Panel  (w/Total Calcium)  Glucose, Serum 278  BUN 84  Creatinine (comp) 4.12  Sodium, Serum 132  Potassium, Serum 4.9  Chloride, Serum 101  CO2, Serum 22  Calcium (Total), Serum 9.3  Anion Gap 9  Osmolality (calc) 300  eGFR (African American) 13  eGFR (Non-African American) 11  eGFR values <16m/min/1.73 m2 may be an indication of chronic  kidney disease (CKD).  Calculated eGFR is useful in patients with stable renal function.  The eGFR calculation will not be reliable in acutely ill patients  when serum creatinine is changing rapidly. It is not useful in   patients on dialysis. The eGFR calculation may not be applicable  to patients at the low and high extremes of body sizes, pregnant  women, and vegetarians.  Misc Urine Chem:    23-Oct-14 22:29, Protein/Creatinine Ratio, Ur Random  Creatinine, Urine 97.6  Protein, Random Urine 285  Protein/Creat Ratio (comp) 2920  Result(s) reported on 16 May 2013 at 11:40PM.  Cardiac:    19-Oct-14 07:43, Troponin I  Troponin I < 0.02  0.00-0.05  0.05 ng/mL or less: NEGATIVE   Repeat testing in 3-6 hrs   if clinically indicated.  >0.05 ng/mL: POTENTIAL   MYOCARDIAL INJURY. Repeat   testing in 3-6 hrs if   clinically indicated.  NOTE: An increase or decrease   of 30% or more on serial   testing suggests a   clinically important change  Routine UA:    19-Oct-14 07:43, Urinalysis  Color (UA) Straw  Clarity (UA) Clear  Glucose (UA) 50 mg/dL  Bilirubin (UA) Negative  Ketones (UA) Negative  Specific Gravity (UA) 1.010  Blood (UA) Negative  pH (UA) 6.0  Protein (UA) >=500  Nitrite (UA) Negative  Leukocyte Esterase (UA) Negative  Result(s) reported on 12 May 2013 at 08:14AM.  RBC (UA) 2 /HPF  WBC (UA) <1 /HPF  Bacteria (UA)   NONE SEEN  Epithelial Cells (UA) <1 /HPF  Mucous (UA) PRESENT  Result(s) reported on 12 May 2013 at 08:14AM.  Routine Sero:    23-Oct-14 22:29, Occult Blood, Feces  Occult Blood, Feces NEGATIVE  Result(s)  reported on 16 May 2013 at 10:50PM.  Routine Hem:    19-Oct-14 07:43, Hemogram, Platelet Count  WBC (CBC) 12.2  RBC (CBC) 4.97  Hemoglobin (CBC) 12.8  Hematocrit (CBC) 39.4  Platelet Count (CBC) 241  Result(s) reported on 12 May 2013 at 08:11AM.  MCV 79  MCH 25.7  MCHC 32.5  RDW 18.3    20-Oct-14 04:16, CBC Profile  WBC (CBC) 15.7  RBC (CBC) 4.35  Hemoglobin (CBC) 11.3  Hematocrit (CBC) 34.9  Platelet Count (CBC) 238  MCV 80  MCH 25.9  MCHC 32.4  RDW 18.5  Neutrophil % 86.2  Lymphocyte % 8.1  Monocyte % 5.5  Eosinophil % 0.0  Basophil % 0.2  Neutrophil # 13.5  Lymphocyte # 1.3  Monocyte # 0.9  Eosinophil # 0.0  Basophil # 0.0  Result(s) reported on 13 May 2013 at 05:43AM.    22-Oct-14 07:05, CBC Profile  WBC (CBC) 10.6  RBC (CBC) 4.22  Hemoglobin (CBC) 11.2  Hematocrit (CBC) 33.9  Platelet Count (CBC) 190  MCV 80  MCH 26.4  MCHC 32.8  RDW 18.5  Neutrophil % 89.8  Lymphocyte % 8.4  Monocyte % 1.2  Eosinophil % 0.0  Basophil % 0.6  Neutrophil # 9.6  Lymphocyte # 0.9  Monocyte # 0.1  Eosinophil # 0.0  Basophil # 0.1  Result(s) reported on 15 May 2013 at 07:21AM.   RADIOLOGY:  Radiology Results: LabUnknown:    19-Oct-14 10:32, CT Abdomen and Pelvis Without Contrast  PACS Image  CT:  CT Abdomen and Pelvis Without Contrast  REASON FOR EXAM:    (1) diffuse abd pain, nausea/vomiting, hx of ckd; (2)   diffuse abd pain, nausea/v  COMMENTS:       PROCEDURE: CT  - CT ABDOMEN AND PELVIS W0  - May 12 2013 10:32AM     RESULT: Noncontrast renal stone protocol CT of the abdomen and pelvis is   reconstructed at 3.0 mm slice thickness in the axial plane and compared   to the previous exam of 09/14/2012. The patient has no other previous   exams for comparison.    Scattered atherosclerotic calcification is seen in the aorta and its   branches without evidence of aneurysm. Calcific densities are present in   the renal hilar regions which appear to be likely  atherosclerotic. Small   stones or not excluded but there is no obstruction. Noncontrast images of     the liver, spleen, gallbladder, pancreas, adrenal glands and bony   structures appear grossly normal. The uterus and urinary bladder appear   to be unremarkable. No adnexal mass, abnormal fluid collection or free   fluid is seen. A normal appearing appendix is demonstrated. There is a   fat filled small umbilical hernia. There is no abnormal bowel distention   or bowel wall thickening. The lung bases are grossly clear with some   minimal atelectasis. There is motion artifact present.    IMPRESSION:   1. No acute abnormality of the abdomen or pelvis.    Dictation Site: 6        Verified By: Sundra Aland, M.D., MD   ASSESSMENT AND PLAN:  Assessment/Admission Diagnosis CKD, no immediate need for dialysis but likely to progress to ESRD in near future DM, HTN, obesity and other medical issues as above   Plan Agree with outpatient assessment for permanent dialysis access.   discussed HD with her and the differences in catheters, grafts, and fistula. discussed AVF preferred, but outpatient duplex will be done to see what her options for fistula will be and if a fistula not a likely option, AVG would be placed close to the time of starting HD. She agrees to outpatient follow up and this will be arranged.     Level 4   Electronic Signatures: Algernon Huxley (MD)  (Signed 27-Oct-14 12:01)  Authored: Chief Complaint and History, PAST MEDICAL/SURGICAL HISTORY, ALLERGIES, HOME MEDICATIONS, Family and Social History, Review of Systems, Physical Exam, LABS, RADIOLOGY, Assessment and Plan   Last Updated: 27-Oct-14 12:01 by Algernon Huxley (MD)

## 2014-11-14 NOTE — Discharge Summary (Signed)
PATIENT NAME:  Cynthia Dean, Cynthia Dean MR#:  [id number] DATE OF BIRTH:  Sep 01, 1950  DATE OF ADMISSION:  09/14/2012 DATE OF DISCHARGE:  09/19/2012  DISCHARGE DIAGNOSES: 1.  Systemic inflammatory response syndrome secondary to pneumonia.  2.  Acute on chronic respiratory failure secondary to pneumonia.  3.  Insulin-dependent diabetes mellitus.  4.  Chronic kidney disease stage 4.  5.  Chronic diastolic congestive heart failure.   6.  Nausea and right upper quadrant pain, resolved.  7.  Accelerated hypertension.   DISCHARGE MEDICATIONS:   1.  Vitamin D 50,000 units 1 capsule by mouth 2 times a week, on Wednesday and Friday.  2.  Advair Diskus 250/50 1 puff twice daily.  3.  Diazepam 5 mg as needed every 4 hours for anxiety.  4.  Lasix 40 mg by mouth twice daily.  5.  Colace 100 mg by mouth daily.  6.  Neurontin 300 mg 3 capsules 3 times daily.  7.  Amitriptyline 10 mg 2 tablets once a day at bedtime.  8.  Aspirin 81 mg daily.  9.  Ferrous sulfate 320 mg by mouth po bid 10.  Amlodipine 10 mg daily.  11.  NovoLog 70/30 40 units twice daily.  12.  Promethazine 25 mg every 4 to 6 hours as needed.  13.  Dilaudid 2 mg every 4 hours as needed for pain.  14.  Metoprolol 50 mg by mouth q. 12 hours.  15.  Combivent 1 puff 4 times daily.  16.  Mucinex as needed.  17.  Levaquin 250 mg every 24 hours.  18.  Prednisone dose tapering.   HOME OXYGEN:  2 liters via nasal cannula.   PRIMARY CARE PHYSICIAN:  Dr. Mammie Lorenzo at [facility].    The patient was given Levaquin from February 26th to March 3rd.  She also given a prescription for prednisone.  CONSULTATIONS:  None.   HOSPITAL COURSE:   1.  The patient is a 64 year old female with multiple medical problems who follows up with Dr. Mammie Lorenzo at [facility] came in because of nausea, vomiting, abdominal pain with fever, cough and phlegm.  The patient was tachycardic with heart rate 120 beats on admission.  The patient admitted to hospitalist service as  the chest x-ray showed pneumonia.  She is started on IV Levaquin along with inhalers and continued on oxygen.  The patient thought to have acute on chronic respiratory failure secondary to pneumonia.  She has history of COPD on home oxygen 2 liters at home.  She is initially on only inhalers along with antibiotics and oxygen, but the next day she started to have wheezing, started on IV Solu-Medrol that helped her a lot.  Initially white count was up at 15.7, but it is normal at the time of discharge.  The patient's wheezing also got better with IV steroids, so we gave a prescription for Levaquin, taper prednisone and she is advised to continue her inhalers and Combivent.  Follow up with her doctor, Dr. Mammie Lorenzo at [facility] in a week to 2.   2.  The patient has history of CKD stage 3 with accelerated hypertension.  Blood pressure controlled with Lasix, amlodipine, hydralazine.  3.  Diabetes mellitus type 2.  Initially she had nausea and abdominal pain so she could not eat much, so we had to continue only sliding scale coverage, but later on when nausea resolved and we started her on insulin that she takes at home, but at a lower dose and then at  the time of discharge her by mouth intake was good.  Her abdominal pain resolved, so we did resume her insulin.  The patient did have a CT abdomen and pelvis on admission because she had nausea, vomiting, abdominal pain, but her lipase was normal 299 on admission.  Her CAT scan of the abdomen showed no evidence of obstruction or inflammation.  Spleen, adrenals are  normal.  There is no evidence of diverticulitis.  The patient had an ultrasound of the abdomen because of the abdominal pain, nausea and vomiting which showed only gallbladder sludge, but no stones and abdominal ultrasound is done on February 23rd which showed a small amount of sludge versus echogenic bile cannot be excluded, but no classic gallstones.  No sonographic evidence of acute cholecystitis.  The  patient's nausea got better, but advised her to follow up with her primary doctor for possible HIDA scan if the symptoms recur.  4.  There was slight deviation of her creatinine from 2.9 to 3.1.  She has history of CKD stage 4, so advised her to follow with her primary doctor.  5.  For her SIRS, when she came she did receive 3 liters of fluid total and she had a slightly elevated potassium and she is ready for discharge and she did receive Kayexalate for that.  Potassium was around 5.2 on the 26th, the day of discharge.    TIME SPENT ON DISCHARGE PREPARATION:  More than 30 minutes.     ____________________________ Epifanio Lesches, MD sk:ea D: 09/22/2012 18:06:29 ET T: 09/23/2012 06:26:02 ET JOB#: [id number]  cc: Epifanio Lesches, MD, <Dictator> Dr. Mammie Lorenzo Epifanio Lesches MD ELECTRONICALLY SIGNED 09/26/2012 14:40

## 2014-11-14 NOTE — Discharge Summary (Signed)
PATIENT NAME:  Cynthia Dean, HAUT MR#:  [id number] DATE OF BIRTH:  June 28, 1951  DATE OF ADMISSION:  05/12/2013 DATE OF DISCHARGE:  05/20/2013  Discharged to home.  DISCHARGE DIAGNOSES: 1.  Acute on chronic respiratory failure.  2.  Acute chronic obstructive pulmonary disease exacerbation.  3.  Acute on chronic renal failure.  4.  Coronary artery disease CKD stage IV.  5.  Hypertension.  6.  Gastroenteritis.  7.  Diabetes mellitus type 2.  8.  Uncontrolled hypertension.  9.  Hyponatremia.  10.  Anemia of chronic disease.   CONSULTATIONS:  1.  Dr. Lucky Cowboy with vascular surgery.  2.  Dr. Juleen China of nephrology.  3.  Dr. Gustavo Lah with GI.   Imaging studies done include a CT scan of the abdomen and pelvis without contrast, showed no acute abnormalities.   Chest x-ray, portable, showed no acute cardiopulmonary disease. Did show mild pulmonary edema.   Ultrasound of the abdomen showed mild hepatomegaly. No cholecystitis. No pancreatic lesions.   ADMITTING HISTORY AND PHYSICAL: Please see detailed H and P dictated previously. Also refer to the interim discharge summary dictated by Dr. Vianne Bulls. In brief, this is a 64 year old African American female patient with history of CKD, hypertension, COPD, chronic respiratory failure, presented to the hospital complaining of nausea, vomiting, abdominal pain. The patient had a CT of the abdomen done, which was normal, admitted for gastroenteritis. The patient also had COPD exacerbation with worsening respiratory status during the hospital stay.   The patient was treated with broad-spectrum antibiotics for concern for aspiration pneumonia, although a chest x-ray did not show any infiltrates. The patient was afebrile. White count was in the normal range. The patient improved well with her breathing, has some mild wheezing by the day of discharge. Will be tapered on p.o. prednisone after discharge, along with nebulizers, continue home inhalers. The patient is  afebrile, normal white count, feels back to baseline, is on 2 liters home oxygen.   The patient also had some worsening in her CKD, which was thought to be progressive and not acute. Continued on medications. The patient was evaluated by vascular surgery for an AV fistula as outpatient. She will follow up with Dr. Lucky Cowboy as outpatient.   The patient was also seen by Dr. Gustavo Lah for her nausea and vomiting, which was thought to be secondary to viral gastroenteritis, which resolved quickly.   On the day of discharge, the patient does not have any abdominal tenderness on exam. Lungs have mild wheezing. Normal work of breathing.   DISCHARGE MEDICATIONS: Include:  1.  Prednisone 60 mg tapered to 5 mg daily.  2.  Vitamin D2, 50,000 International Units 2 times a week on Wednesday and Friday.  3.  Advair Diskus 250/50, 1 puff inhaled twice a day.  4.  Diazepam 5 mg oral every 4 hours as needed.  5.  Colace 100 mg oral once a day as needed.  6.  Gabapentin 300 mg 3 capsules oral 3 times a day.  7.  Amitriptyline 10 mg 2 tablets oral once a day at bedtime.  8.  Aspirin 81 mg daily.  9.  Ferrous sulfate 325 mg oral 2 times a day.  10.  Amlodipine 10 mg oral once a day.  11.  Metoprolol tartrate 50 mg oral twice a day.  12.  Albuterol/ipratropium 1 puff inhaled 4 times a day.  13.  Hydroxyzine 100 mg oral 2 times a day.  14.  NovoLog 70/30, 15 units subcutaneous 2 times a  day.  15.  DuoNeb 3 mL inhaled 3 times a day as needed for shortness of breath.  16.  Benzonatate 100 mg oral every 6 hours as needed for cough.  17.  Lasix 40 mg oral once a day.  18.  Guaifenesin 600 mg oral 2 times a day.  19.  Protonix 40 mg daily.   DISCHARGE INSTRUCTIONS: The patient was discharged home on 2 liters home oxygen, continuous, renal diet. Activity as tolerated. Follow up with Dr. Lucky Cowboy and primary care physician in 1 to 2 weeks. The patient will also follow up with nephrology in 2 to 4 weeks.   Time spent on day of  discharge in discharge activity was 45 minutes.    ____________________________ Leia Alf Rana Adorno, MD srs:dmm D: 05/20/2013 14:50:09 ET T: 05/20/2013 22:28:45 ET JOB#: [id number]  cc: Alveta Heimlich R. Darvin Neighbours, MD, <Dictator> Algernon Huxley, MD [place] Lilian Kapur, MD Loraine MD ELECTRONICALLY SIGNED 05/22/2013 13:12

## 2014-11-14 NOTE — Consult Note (Signed)
Brief Consult Note: Diagnosis: acute gastroenteritis, marked abdominal wall pain associated with multiple episodes of emesis.   Patient was seen by consultant.   Consult note dictated.   Recommend further assessment or treatment.   Comments: Please see  consult [phone].  Patient seena nd examined, full consult dictated, see aboce, continue current. Following.  Electronic Signatures: Loistine Simas (MD)  (Signed 19-Oct-14 20:01)  Authored: Brief Consult Note   Last Updated: 19-Oct-14 20:01 by Loistine Simas (MD)

## 2014-11-15 NOTE — Consult Note (Signed)
Chief Complaint:  Subjective/Chief Complaint seen for n/v and epigastric pain.  feeling much better, no further n/v.  continues with epigastric discomfort.  no melena.   VITAL SIGNS/ANCILLARY NOTES: **Vital Signs.:   23-Nov-15 16:26  Vital Signs Type Q 8hr  Temperature Temperature (F) 98.8  Celsius 37.1  Temperature Source oral  Pulse Pulse 84  Respirations Respirations 18  Systolic BP Systolic BP 979  Diastolic BP (mmHg) Diastolic BP (mmHg) 72  Mean BP 84  Pulse Ox % Pulse Ox % 91  Pulse Ox Activity Level  At rest  Oxygen Delivery Room Air/ 21 %   Brief Assessment:  GEN obese   Cardiac Regular   Respiratory clear BS   Gastrointestinal details normal Soft  Nondistended  Bowel sounds normal  mild tenderness to palpation in the epigastrum   Lab Results: Hepatic:  23-Nov-15 09:15   Albumin, Serum  2.9  Routine Chem:  23-Nov-15 09:15   Glucose, Serum  226  BUN  47  Creatinine (comp)  7.60  Sodium, Serum  135  Potassium, Serum 4.8  Chloride, Serum 100  CO2, Serum 22  Calcium (Total), Serum  7.2  Phosphorus, Serum  5.4  Anion Gap 13  Osmolality (calc) 289  eGFR (African American)  7  eGFR (Non-African American)  6 (eGFR values <108m/min/1.73 m2 may be an indication of chronic kidney disease (CKD). Calculated eGFR, using the MRDR Study equation, is useful in  patients with stable renal function. The eGFR calculation will not be reliable in acutely ill patients when serum creatinine is changing rapidly. It is not useful in patients on dialysis. The eGFR calculation may not be applicable to patients at the low and high extremes of body sizes, pregnant women, and vegetarians.)  Routine Hem:  23-Nov-15 09:15   WBC (CBC) 8.9  RBC (CBC)  3.36  Hemoglobin (CBC)  9.7  Hematocrit (CBC)  30.8  Platelet Count (CBC) 189  MCV 92  MCH 28.9  MCHC  31.5  RDW  16.0  Neutrophil % 61.3  Lymphocyte % 27.4  Monocyte % 8.5  Eosinophil % 1.9  Basophil % 0.9  Neutrophil #  5.4  Lymphocyte # 2.4  Monocyte # 0.8  Eosinophil # 0.2  Basophil # 0.1 (Result(s) reported on 16 Jun 2014 at 11:36AM.)   Assessment/Plan:  Assessment/Plan:  Assessment 1) n/v possible small amount of hematemesi PTA.  not recurrent, symptoms much improved.  continuing with epigastric pain.  On ppi.  Patietn had 2 days dose of plavix after graft declotting on thursday.  no plavix since saturday.  will do egd tomorrow pm for further evaluation of the epigastric pain.  I have discussed the risks benefits and complications fo egd to include not limited to bleeding infection perforation and sedation and she wishes to proceed.   Plan as above.   Electronic Signatures: SLoistine Simas(MD)  (Signed [id number])  Authored: Chief Complaint, VITAL SIGNS/ANCILLARY NOTES, Brief Assessment, Lab Results, Assessment/Plan   Last Updated: 23-Nov-15 18:04 by SLoistine Simas(MD)

## 2014-11-15 NOTE — Consult Note (Signed)
PATIENT NAME:  TEHRA, DITSWORTH MR#:  [id number] DATE OF BIRTH:  August 05, 1950  DATE OF CONSULTATION:  06/17/2014  REFERRING PHYSICIAN:   CONSULTING PHYSICIAN:  Leotis Pain, MD  REASON FOR CONSULTATION: Right-sided weakness.  HISTORY OF PRESENT ILLNESS: This is a 64 year old, African American female with past medical history of diabetes, end-stage renal disease on dialysis, presenting to the Emergency Department with vomiting episode, status post hemodialysis. It was suspected the patient was vomiting blood, admitted to the floor, started on Protonix drip. Currently, mental status improved. No more vomiting episodes. It has been reported the patient in the past few days has had on and off right upper extremity weakness that has significantly improved as the patient is currently back to baseline.   PAST MEDICAL HISTORY: Hypertension, diabetes, end-stage renal disease on dialysis Tuesday, Thursday, Saturday; history of COPD, history of gastroesophageal reflux disease, and obesity.   SOCIAL HISTORY: Previous smoker. No illicit drug use or alcohol intake.  FAMILY HISTORY: Coronary artery disease.   PAST SURGICAL HISTORY: Status post breast biopsy.   The patient's vital signs, laboratory work-up and imaging have been reviewed. CAT scan of the head was done. No acute intracranial pathology was noted.   REVIEW OF SYSTEMS:  RESPIRATORY:  No shortness of breath.  GI:  No abdominal pain. No diarrhea. No constipation.  NEUROLOGIC: No weakness on one side of the body compared to the other, currently.  ENDOCRINE:  No heat or cold intolerance.  PSYCHIATRIC: No anxiety. No depression.   PHYSICAL EXAMINATION:  VITAL SIGNS: Include a temperature of 97.7, pulse 86, respirations are 18, blood pressure 132/65.  NEUROLOGIC: The patient is alert, awake, oriented and follows commands somewhat. Able to tell me today's date and the hospital. Husband is at the bedside. Speech appears to be fluent. Extraocular  movements are intact. Facial sensation intact. Facial motor is intact. Tongue is midline. Uvula elevates symmetrically. Shoulder shrug intact. Motor appears to be 5/5 bilaterally. Upper extremity slight weakness; lower extremities, 4 to 4+/5. No drift on the right upper extremity noted. Reflexes severely diminished throughout. Sensation intact to light touch and temperature. Coordination: Finger-to-nose intact. Gait could not be assessed.   IMPRESSION: A 64 year old female with past medical history of hypertension and diabetes, end-stage renal disease, presented with hematemesis and found to have right-sided weakness for the past 2 to 3 days. On current examination, the patient's symptoms have resolved. She states she is back to baseline.   PLAN: At home,  the patient was on aspirin 81 mg. I would like to continue that once safe from GI perspective. I will also order a carotid ultrasound to make sure there is no hemodynamic stenosis, specifically in the left internal carotid artery that could be causing this crescendo decrescendo TIA type of symptoms. Would also try to make sure the patient's blood pressure did not drop below 123456 to [id number] systolic because if there is hypoperfusion, that could be contributing to acute ischemia. After carotid ultrasound is performed, if no hemodynamic stenosis, I feel safe for discharge from a neurological standpoint. If there is some hemodynamic stenosis, the patient can follow up outpatient with a vascular surgeon.   Thank you. It is a pleasure seeing this patient. The case was discussed with patient and the patient's who is currently at bedside. Please call with any questions.    ____________________________ Leotis Pain, MD yz:je D: 06/17/2014 12:31:48 ET T: 06/17/2014 12:42:50 ET JOB#: [id number]  cc: Leotis Pain, MD, <Dictator> Leotis Pain MD ELECTRONICALLY SIGNED 06/23/2014  13:39 

## 2014-11-15 NOTE — Consult Note (Signed)
Chief Complaint:  Subjective/Chief Complaint seen for nausea vomiting and epigastric pain, possible hematemesis.  Doing well, no evidence of repeat bleeding.  Mild epigastric pain/discomfort.  no n/v, no bm   VITAL SIGNS/ANCILLARY NOTES: **Vital Signs.:   24-Nov-15 08:01  Vital Signs Type Q 8hr  Temperature Temperature (F) 97.7  Celsius 36.5  Temperature Source oral  Pulse Pulse 86  Respirations Respirations 18  Systolic BP Systolic BP [id number]  Diastolic BP (mmHg) Diastolic BP (mmHg) 65  Mean BP 87  Pulse Ox % Pulse Ox % 93  Pulse Ox Activity Level  At rest  Oxygen Delivery 1L   Brief Assessment:  Cardiac Regular   Respiratory clear BS   Gastrointestinal details normal Soft  Nondistended  No masses palpable  Bowel sounds normal  mild  epigastric discomfort to palpation.   Lab Results: Routine Coag:  24-Nov-15 05:38   Prothrombin 13.2  INR 1.0 (INR reference interval applies to patients on anticoagulant therapy. A single INR therapeutic range for coumarins is not optimal for all indications; however, the suggested range for most indications is 2.0 - 3.0. Exceptions to the INR Reference Range may include: Prosthetic heart valves, acute myocardial infarction, prevention of myocardial infarction, and combinations of aspirin and anticoagulant. The need for a higher or lower target INR must be assessed individually. Reference: The Pharmacology and Management of the Vitamin K  antagonists: the seventh ACCP Conference on Antithrombotic and Thrombolytic Therapy. [id number] Sept:126 (3suppl): [id number]. A HCT value >55% may artifactually increase the PT.  In one study,  the increase was an average of 25%. Reference:  "Effect on Routine and Special Coagulation Testing Values of Citrate Anticoagulant Adjustment in Patients with High HCT Values." American Journal of Clinical Pathology 2006;126:400-405.)  Routine Hem:  21-Nov-15 10:26   Hemoglobin (CBC)  11.7    16:52   Hemoglobin  (CBC)  11.5 (Result(s) reported on 14 Jun 2014 at 05:15PM.)    21:01   Hemoglobin (CBC)  10.5 (Result(s) reported on 14 Jun 2014 at 09:34PM.)  22-Nov-15 00:58   Hemoglobin (CBC) -  Hemoglobin (CBC)  10.6  23-Nov-15 09:15   Hemoglobin (CBC)  9.7  24-Nov-15 05:38   WBC (CBC) 8.1  RBC (CBC)  3.23  Hemoglobin (CBC)  9.5  Hematocrit (CBC)  29.6  Platelet Count (CBC) 173  MCV 92  MCH 29.4  MCHC 32.0  RDW  16.2  Neutrophil % 54.0  Lymphocyte % 30.5  Monocyte % 12.4  Eosinophil % 2.5  Basophil % 0.6  Neutrophil # 4.4  Lymphocyte # 2.5  Monocyte #  1.0  Eosinophil # 0.2  Basophil # 0.1 (Result(s) reported on 17 Jun 2014 at 07:07AM.)   Radiology Results: [place]:    22-Nov-15 16:51, US Carotid Doppler Bilateral  US Carotid Doppler Bilateral   REASON FOR EXAM:    TIA  COMMENTS:       PROCEDURE: [place]  - US CAROTID DOPPLER BILATERAL  - Jun 15 2014  4:51PM     CLINICAL DATA:  TIA.    EXAM:  BILATERAL CAROTID DUPLEX ULTRASOUND    TECHNIQUE:  Pearline Cables scale imaging, color Doppler and duplex ultrasound were  performed of bilateral carotid and vertebral arteries in the neck.    COMPARISON:  None.  FINDINGS:  Criteria: Quantification of carotid stenosis is based on velocity  parameters that correlate the residual internal carotid diameter  with NASCET-based stenosis levels, using the diameter of the distal  internal carotid lumen as the denominator for stenosis measurement.  The following velocity measurements were obtained:    RIGHT    ICA:  122/31 cm/sec    CCA:  123456 cm/sec    SYSTOLIC ICA/CCA RATIO:  1.0  DIASTOLIC ICA/CCA RATIO:  1.8    ECA:  159 cm/sec    LEFT    ICA:  100/23 cm/sec    CCA:  123XX123 cm/sec    SYSTOLIC ICA/CCA RATIO:  0.8    DIASTOLIC ICA/CCA RATIO:  1.2    ECA:  144 cm/sec  RIGHT CAROTID ARTERY: Mild calcified plaque in the mid right CCA,  right carotid bulb, and proximal right ICA. No hemodynamically  significant stenosis, less than  50%.    RIGHT VERTEBRAL ARTERY:  Antegrade flow    LEFT CAROTID ARTERY: Mild calcified plaque in the left carotid bulb,  proximal ICA and ECA. No hemodynamically significant stenosis, less  than 50%.    LEFT VERTEBRAL ARTERY:  Antegrade flow.     IMPRESSION:  Mild calcified plaque bilaterally as above without hemodynamically  significant stenosis.  Electronically Signed    By: Rolm Baptise M.D.    On: 06/15/2014 17:18         Verified By: Raelyn Number, M.D.,  MRI:    23-Nov-15 14:36, MRI Brain Without Contrast  MRI Brain Without Contrast   REASON FOR EXAM:    TIA  COMMENTS:       PROCEDURE: MR  - MR BRAIN WO CONTRAST  - Jun 16 2014  2:36PM     CLINICAL DATA:  Right hand weakness.  TIA.    EXAM:  MRI HEAD WITHOUT CONTRAST    TECHNIQUE:  Multiplanar, multiecho pulse sequences of the brain and surrounding  structures were obtained without intravenous contrast.    COMPARISON:  CT head 06/15/2014  FINDINGS:  Ventricle size normal.  Cerebral volume normal for age.    Negative for acute infarct. Mild chronic microvascular ischemic  changes in the white matter. 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 Brainstem is intact.    Negative for hemorrhage. Negative for mass lesion. Pituitary normal  in size.     IMPRESSION:  Mild chronic microvascular ischemia.  No acute abnormality.      Electronically Signed    By: Franchot Gallo M.D.    On: 06/16/2014 14:41         Verified By: Truett Perna, M.D.,   Assessment/Plan:  Assessment/Plan:  Assessment 1) n/v epigastric pain, possible hematemesis.  stable, mild epigastric discomfort currently.  no evidence of recurrent bleeding.  2) multiple medical issures, dm, htn, ckd.   Plan 1) I have discussed the risks benefits and complications of egd to include not limited to bleeding infection perforationa dn sedationa nd she wishes to proceed.  I have discussed the risks of recent plavix.   Electronic  Signatures: Loistine Simas (MD)  (Signed [phone] 14:37)  Authored: Chief Complaint, VITAL SIGNS/ANCILLARY NOTES, Brief Assessment, Lab Results, Radiology Results, Assessment/Plan   Last Updated: 24-Nov-15 14:37 by Loistine Simas (MD)

## 2014-11-15 NOTE — Consult Note (Signed)
PATIENT NAME:  Cynthia Dean, JASPER MR#:  [id number] DATE OF BIRTH:  Sep 14, 1950  DATE OF CONSULTATION:  06/14/2014  REFERRING PHYSICIAN:   CONSULTING PHYSICIAN:  Cynthia Sails, MD  Patient of Dr. Bridgett Dean.   REASON FOR CONSULTATION: GI bleed.   HISTORY OF PRESENT ILLNESS: [name] is a 64 year old African American female with a history of multiple medical illnesses including end-stage renal disease, on hemodialysis. She was only recently started on dialysis about 4 months ago. She states that she goes on Tuesday, Thursday, and Saturday. She went in on Thursday to have her dialysis and they found her graft to be clotted. She was sent to see Dr. Lucky Dean who declotted the graft. She then went yesterday afternoon for dialysis. She stated that the nausea started during her dialysis yesterday. Then she repeated the dialysis this morning and again the nausea recurred, accompanied by some emesis. She states that most of what she threw up was phlegmy or foamy and whitish; however, there was some dark material. This sounds much like some volume of coffee-ground material. There was no bright red bleeding. She states she has seen no black stools. She has had some epigastric pain today; however, has been having multiple episodes of dry heaves, as well as emesis for a couple of days. She states that ever since she started dialysis she gets nauseated every time she is on dialysis. This may last for multiple hours afterwards. She has no previous history of peptic ulcer disease. She has been taking omeprazole for a history of gastroesophageal reflux. She denies any dysphagia. There has been no abnormal weight loss. She does have intermittent constipation for which she takes stool softeners. She states she has not had a bowel movement for about 3 days. She does deny seeing any black stools, blood in the stools, or slimy stools. She had an EGD done 11/19/2008 for hematemesis, at that time showing a grade A esophagitis and some  mild gastritis. She also had a colonoscopy 01/23/2008 that was normal.   GASTROINTESTINAL FAMILY HISTORY: Negative for colorectal cancer or liver disease. Her father did have peptic ulcer disease.   PAST MEDICAL HISTORY: 1.  Hypertension.  2.  Diabetes.  3.  End-stage renal disease, on dialysis as above.  4.  COPD.  5.  GERD.  6.  Morbid obesity.   SOCIAL HISTORY: She is not currently a smoker, had been previously. She does not use alcohol or drugs.   OUTPATIENT MEDICATIONS: Include albuterol/ipratropium 100 mcg/20 mcg 2 puffs twice a day, amitriptyline 25 mg 1 to 2 tablets at night for sleep, aspirin 81 mg 1 tablet once a day, diazepam 5 mg every 12 hours p.r.n., Lyrica 25 mg daily, metoprolol tartrate 25 mg twice a day, Novolin 70/30 with 45 to 50 units subcutaneous twice a day or as directed, omeprazole 20 mg twice a day, PhosLo 667 mg oral capsule 2 three times a day and she also takes Plavix 75 mg once a day, promethazine 25 mg daily every 6 hours as needed for nausea, Sensipar 30 mg once a day with a meal, Torsemide 100 mg once a day, tramadol 50 mg 1 every 6 to 8 hours p.r.n., valsartan 160 mg 1 tablet once a day, vitamin D2 at 50,000 units once a week on Fridays.   ALLERGIES: HYDROCODONE, ONDANSETRON, PERCOCET, AND TAPE.   PHYSICAL EXAMINATION: VITAL SIGNS: Temperature is 97.8, pulse 108 to 110, respirations 20, blood pressure 199/95 to 201/73.  GENERAL: She is a 64 year old African American  female, anxiety, morbidly obese.  HEENT: Normocephalic, atraumatic. Eyes are anicteric. Nose: Septum midline. Oropharynx: No lesions.  NECK: No JVD.  HEART: Mild tachycardia without rub or gallop.  LUNGS: Clear.  ABDOMEN: Soft. There is some tenderness to palpation in the central epigastric region extending to the upper epigastrium. It is not possible to palpate internal organs or determine masses. There is no rebound.  RECTAL: Anorectal exam deferred.  EXTREMITIES: With 1+ lower extremity  edema.  NEUROLOGICAL: Cranial nerves II through XII grossly intact.   LABORATORY DATA: On admission to the hospital, she had a glucose of 198, BUN was 14, creatinine 3.23, sodium 136, potassium 4.2, chloride 28, osmolality 278, lipase 310. Hepatic profile showing an alkaline phosphatase at 233, otherwise normal. Troponin I x 2 less than 0.02. Her hemogram on admission showed a white count of 10.2 with a hemoglobin and hematocrit of 11.7/36.7, platelet count of 203,000. MCV was 91. She had a repeat hemoglobin 6 hours after the first, this showing an 11.5. She has had no melena. She has shown no bleeding. She has had no bright red hematemesis. Material that she is bringing up in front of this examiner is frothy and white. A urinalysis was done showing 100 mg/dL protein, trace bacteria, 1 per high-power field white cell, glucose greater than or equal to 500. She had a potassium done separately of 4.8. She had a 3-way abdominal film showing a nonobstructive bowel gas pattern with mild fecal retention throughout the colon. There was a prominence of perihilar markings without significant change, probable mild degree of vascular congestion.   ASSESSMENT: 1.  Recurrent episodes of nausea. This seems to be more so related to her ongoing hemodialysis per patient. She states that every time she has hemodialysis she does get nauseated.  2.  Episode of nausea, vomiting, and epigastric pain. This started today after having hemodialysis 2 days in a row. There has been no bright red hematemesis. The patient has not seen any black or bloody stools. She has been hemodynamically stable indeed with hypertension.   RECOMMENDATIONS: 1.  The patient is currently on a Protonix IV drip. I would continue this. Serial hemoglobins. Transfuse as needed.  2.  EGD. I have discussed the risks, benefits, and complications of EGD to include, but not limited to bleeding, infection, perforation, and the risk of sedation and she wishes to  proceed. We will proceed with this on Monday unless there is a change clinically in the interim. She will require monitored anesthesia/anesthesia assistance.  3.  We will follow with you.     ____________________________ Cynthia Sails, MD mus:at D: 06/14/2014 19:59:51 ET T: 06/14/2014 20:32:23 ET JOB#: [id number]  cc: Cynthia Sails, MD, <Dictator> Cynthia Sails MD ELECTRONICALLY SIGNED 07/15/2014 1:37

## 2014-11-15 NOTE — Op Note (Signed)
PATIENT NAME:  Cynthia Dean, Cynthia Dean MR#:  [id number] DATE OF BIRTH:  09/22/1950  DATE OF PROCEDURE:  04/07/2014  PREOPERATIVE DIAGNOSES:  1. End-stage renal disease.  2. Thrombosed left arm arteriovenous graft.  3. Congestive heart failure.  4. Chronic obstructive pulmonary disease.  5. Diabetes.   POSTOPERATIVE DIAGNOSES:  1. End-stage renal disease.  2. Thrombosed left arm arteriovenous graft.  3. Congestive heart failure.  4. Chronic obstructive pulmonary disease.  5. Diabetes.   PROCEDURE:  1. Ultrasound guidance for vascular access to left arm AV graft in both an antegrade and retrograde fashion.  2. Catheter placement into left radial artery from the graft with left upper extremity angiogram.  3. Catheter directed thrombolysis with 4 mg of tPA to the AV graft.  4. Mechanical rheolytic thrombectomy of the graft.  5. Fogarty embolectomy for arterial plug.  6. Percutaneous transluminal angioplasty of arterial anastomosis with 5 mm diameter angioplasty balloon.  7. Percutaneous transluminal angioplasty of mid to distal graft with 7 mm diameter angioplasty balloon.  8. Percutaneous transluminal angioplasty with 7 and 8 mm diameter angioplasty balloon to the venous anastomosis.  9. Viabahn covered stent placement to mid to distal graft for greater than 50% residual stenosis and thrombosis after angioplasty with 8 mm diameter x 15 cm length Viabahn stent.   SURGEON: Algernon Huxley, MD.   ANESTHESIA: Local with moderate conscious sedation.   ESTIMATED BLOOD LOSS: 25 mL.    FLUOROSCOPY TIME: 8 minutes.    CONTRAST: 75 mL.   INDICATION FOR PROCEDURE: This is a 64 year old female with end-stage renal disease. Her graft has clotted, this was placed about 2  months ago, we are attempting to salvage this. Risks and benefits were discussed. Informed consent was obtained.   DESCRIPTION OF PROCEDURE: The patient was brought to the vascular suite. The left upper extremity was sterilely prepped  and draped, and a sterile surgical field was created. The graft was pulseless. Ultrasound was used to visualize the graft and it was accessed under direct ultrasound guidance in both an antegrade and retrograde fashion, and permanent images were recorded. We upsized to 6 [place] sheaths and gave the patient initially 3000 units of intravenous heparin and later the procedure an additional 2000 units of intravenous heparin were given. Initial imaging showed thrombosis of the graft Magic torque wires were placed into the central venous circulation and into the brachial artery, and 4 mg of tPA were instilled from the arterial anastomosis throughout the graft and into the axillary vein in the venous portion of the graft. This was allowed to dwell for 15 minutes. Mechanical rheolytic thrombectomy was then performed from the arterial anastomosis throughout the entirety of the graft and into the axillary vein. This uncovered a very high-grade stenosis at the venous anastomosis and I treated this with a 7 mm diameter angioplasty balloon initially. There was arterial plug and no flow through the graft. I treated this arterial plug with a 5 mm diameter x 6 cm in length angioplasty balloon at the arterial anastomosis and this was followed by a 5 Fogarty embolectomy catheter. This did clear the arterial plug, however following performance of this I did not see flow distally through the radial artery, she had a very high brachial bifurcation. This was a radial artery to axillary vein AV graft. To further evaluate this I placed a Kumpe catheter over the wire through the retrograde sheath into the radial artery at the antecubital fossa. There appeared to be a lot of spasm,  but it was still unclear if there was thrombosis within the radial artery. The wire and the catheter advanced easily and I advanced the catheter into the mid forearm radial artery and there was good flow distally. The radial artery did appear patent beyond the  catheter. At this point, I removed the diagnostic catheter. The mid graft had an area of narrowing and thrombosis. I treated this lesion. This was separate and distinct from the arterial anastomotic lesion with a venous anastomotic lesion. This area was treated with a 7 mm diameter angioplasty balloon with  suboptimal result. After using the Fogarty embolectomy catheter once again to clear the clot from the arterial side of the graft I was able to remove the retrograde sheath. I then treated the mid and distal portion of the graft and the venous anastomosis in the axillary vein again with mechanical rheolytic thrombectomy, but the thrombus would not clear. I angioplastied the area in the mid to distal graft again without resolution, hence for this in graft thrombus and stenosis I elected to use a Viabahn covered stent to cover the area. I exchanged for a 7 French sheath and an 0.018 wire and used an 8 mm diameter x 15 cm in length Viabahn stent from the mid graft to the distal graft. This was separate and distinct from the venous anastomosis and was still probably 3-4 cm from the venous anastomosis at the conclusion of the stent. I post dilated this stent with an 8 mm diameter angioplasty balloon and took an 8 mm diameter angioplasty balloon down to the venous anastomosis. At this point there was clinical flow within the graft. Imaging was performed which showed resolution of the stenosis and thrombosis within the graft that had now been covered with the stent. There was brisk flow through the graft. The venous anastomosis was patent with only about a 20% or 25% residual stenosis which was not flow limiting and the remainder of the venous flow in the central venous locations were patent without significant stenosis. At this point I elected to terminate the procedure. The sheath was removed. 4-0 Monocryl pursestring suture was placed. Pressure was held. Sterile dressings were placed. The patient tolerated the  procedure well and was taken to the recovery room in stable condition.    ____________________________ Algernon Huxley, MD jsd:bu D: 04/07/2014 13:21:16 ET T: 04/07/2014 13:51:37 ET JOB#: [id number]  cc: Algernon Huxley, MD, <Dictator> Algernon Huxley MD ELECTRONICALLY SIGNED 04/07/2014 15:07

## 2014-11-15 NOTE — H&P (Signed)
PATIENT NAME:  Cynthia Dean, Cynthia Dean MR#:  [id number] DATE OF BIRTH:  [birth year]  DATE OF ADMISSION:  06/14/2014  PRIMARY CARE PHYSICIAN: Mammie Lorenzo, MD   CHIEF COMPLAINT: Vomiting blood today.   HISTORY OF PRESENT ILLNESS: A 64 year old African American female with a history of hypertension, diabetes, ESRD, who presented to the ED with vomiting blood today. The patient today is alert, awake, oriented, in no acute distress. The patient has ESRD on hemodialysis. She started to have nausea, vomiting and abdominal pain, at the end of hemodialysis today. In addition, she vomited blood. She feels sick and weak. She also complains of headache and dizziness,  but she denies any other bleeding, bruises, hematuria, melena, or bloody stool. The patient was started on Pepcid drip/Protonix drip.   PAST MEDICAL HISTORY: Hypertension, diabetes, ESRD, started 4 months ago on Tuesday/Thursday/Saturday, history of COPD, GERD, obesity.   SOCIAL HISTORY: Previous smoker. No alcohol drinking or illicit drugs.   FAMILY HISTORY: Coronary artery disease, diabetes.   PAST SURGICAL HISTORY: Breast biopsy.   ALLERGIES: HYDROCODONE, ZOFRAN, PERCOCET, TAPE.  HOME MEDICATIONS: Vitamin D2 of 50,000 international units 1 capsule once a week, Losartan 160 mg p.o. daily in the evening, tramadol 50 mg p.o. every 6-8 hours p.r.n. for pain, torsemide 100 mg p.o. once a day, Sensipar 30 mg p.o. daily with meals, promazine 25 mg p.o. every 6 hours p.r.n. for nausea, Plavix 75 mg p.o. daily, PhosLo 667 mg 2 tablets t.i.d., omeprazole 20 mg p.o. b.i.d., Novolin 70/30 45-50 units subcutaneous twice a day, or as directed, Lopressor 25 mg p.o. b.i.d., Lyrica 20 mg p.o. daily, diazepam 5 mg p.o. every 12 hours p.r.n., aspirin 81 mg p.o. daily, DuoNebs CFC free 100 mcg/20 mcg inhalation 2 puffs inhaled twice a day.   REVIEW OF SYSTEMS:  CONSTITUTIONAL: The patient denies any fever or chills, but has a headache, dizziness and weakness.   EYES: No double vision or blurred vision.  EARS, NOSE, AND THROAT: No postnasal drip, slurred speech or dysphagia.  CARDIOVASCULAR: No chest pain, palpitation, orthopnea, nocturnal dyspnea. No leg edema.  PULMONARY: No cough, sputum, shortness of breath, or hemoptysis. GASTROINTESTINAL: Positive due to hematemesis. No abdominal pain, nausea, vomiting. No diarrhea, melena, or bloody stool.  GENITOURINARY: No dysuria, hematuria, or incontinence.  SKIN: No rash or jaundice.  NEUROLOGIC: No syncope, loss of consciousness, or seizure.  ENDOCRINE: No polyuria, polydipsia, heat or cold intolerance.  SKIN: No rash or jaundice.  HEMATOLOGY: No easy bruising or bleeding.  ENDOCRINE: No polyuria, polydipsia, heat or cold intolerance.   PHYSICAL EXAMINATION:  VITAL SIGNS: Temperature 98, blood pressure 138/83, pulse 92, oxygen saturation 96% on room air.  GENERAL: The patient is alert, awake, oriented, in no acute distress.  HEENT: Pupils round, equal and reactive to light and accommodation. Moist oral mucosa. Clear oropharynx.  NECK: Supple. No JVD or carotid bruit. No lymphadenopathy. No thyromegaly.  CARDIOVASCULAR: S1 and S2. Regular rate and rhythm. No murmurs or gallop.  PULMONARY: Bilateral air entry. No wheezing or rales. No use of accessory muscle to breathe.  ABDOMEN: Soft. No distention. No tenderness. No organomegaly. Bowel sounds present.  EXTREMITIES: No edema, clubbing or cyanosis. No calf tenderness. Bilateral pedal pulses present.  SKIN: No rash or jaundice.  NEUROLOGIC: A and O x 3. No focal deficit. Power 5/5. Sensory intact.   LABORATORY DATA: Abdominal 3-way including a PA chest with showed no obstructive bowel gas pattern with mild focal retention throughout the colon. WBC 10.2, hemoglobin 11.7, platelets  203,000. Glucose 198, BUN 14, creatinine 3.23; electrolytes are normal. Lipase 310. EKG showed normal sinus rhythm with prolonged QT at 89 bpm.   IMPRESSIONS:  1.  Upper  gastrointestinal bleeding.  2.  Hypertension.  3.  Diabetes.  4.  End-stage renal disease on hemodialysis.  5.  Anemia.  6.  History of chronic obstructive pulmonary disease, stable.  7. Morbid obesity.  PLAN OF TREATMENT:  1.  The patient will be admitted to medical floor and will continue Protonix drip. Follow up hemoglobin every 8 hours, and we will get a GI consult for possible endoscopy. Hold aspirin and Plavix.  2.  For diabetes, we will start sliding scale. Since the patient is in an n.p.o., except medications, status, we will hold NovoLog 70/30, but may adjust the dose depending on the patient's blood sugar.  3.  Hypertension. Continue the patient's home medications.  4.  For ESRD, we will get neprhology consult, continue hymodialysis. I discussed the patient's condition and plan of treatment with the patient and the patient's fiance. The patient wants Full Code.   TIME SPENT: About 55 minutes.    ____________________________ Demetrios Loll, MD qc:MT D: 06/14/2014 14:37:31 ET T: 06/14/2014 14:56:46 ET JOB#: [id number]  cc: Demetrios Loll, MD, <Dictator> Demetrios Loll MD ELECTRONICALLY SIGNED 06/14/2014 16:02

## 2014-11-15 NOTE — H&P (Signed)
PATIENT NAME:  Cynthia Dean, Cynthia Dean MR#:  [id number] DATE OF BIRTH:  [birth year]  DATE OF ADMISSION:  12/24/2013  PRIMARY CARE PHYSICIAN: Dr. Mammie Lorenzo at [facility].   NEPHROLOGIST: Dr. Anthonette Legato.    CHIEF COMPLAINT: Worsening shortness of breath and lower extremity edema.   HISTORY OF PRESENT ILLNESS: The patient is a 64 year old female with a known history of CKD, hypertension, COPD, diabetes, who is being admitted for new end-stage renal disease requiring start of hemodialysis at this point. The patient has been getting worsening shortness of breath and lower extremity edema for the last week to 10 days. Had seen Dr. Holley Raring yesterday in the office, where labs were drawn and they were worsening at the point where she would require dialysis, and she was requested to come to the Emergency Department for further evaluation and management.   PAST MEDICAL HISTORY: 1.  Obesity.  2.  Type 2 diabetes.  3.  COPD. 4.  Hypertension.  5.  History of CHF. 6.  Chronic kidney disease.  7.  GERD.   ALLERGIES: HYDROCODONE AND PERCOCET.   SOCIAL HISTORY: Former smoker. No history of alcohol use.   FAMILY HISTORY: Positive for diabetes, stroke, coronary artery disease, hypertension.   MEDICATIONS AT HOME: 1.  DuoNeb inhaled 4 times a day.  2.  Amitriptyline 10 mg 2 tablets p.o. at bedtime as needed.  3.  Amlodipine 10 mg p.o. daily. 4.  Aspirin 81 mg p.o. daily.  5.  Colace 100 mg p.o. daily as needed  6.  Diazepam 5 mg every 4 hours as needed. 7.  DuoNeb inhaled 4 times a day as needed.  8.  Ferrous sulfate 325 mg p.o. b.i.d.  9.  Lasix 40 mg p.o. b.i.d.  10.  Gabapentin 300 mg p.o. three times a day. 11.  Hydralazine 100 mg p.o. b.i.d.  12.  Ibuprofen 600 mg p.o. every 6 hours as needed.  13.  Metoprolol 50 mg p.o. b.i.d.  14.  NovoLog mix 70/30, 40 units subcutaneous twice a day.  15.  Protonix 40 mg p.o. b.i.d.  16.  Vitamin D2, 50,000 international units twice a week on  Wednesday and Friday.   REVIEW OF SYSTEMS:    CONSTITUTIONAL: No fever. Positive fatigue and weakness.  EYES: No blurred or double vision.  ENT: No tinnitus or ear pain.  RESPIRATORY: No cough, wheezing, or hemoptysis. Positive for shortness of breath, which has been gradually getting worse.  CARDIOVASCULAR: No chest pain or orthopnea. Positive for lower extremity edema getting worse and dyspnea on exertion also worsening. GASTROINTESTINAL: No nausea, vomiting, or diarrhea.  GENITOURINARY: No dysuria or hematuria. Worsening renal function.  ENDOCRINE: No polyuria or nocturia.  HEMATOLOGIC: Positive for anemia of chronic kidney disease. No easy bruising or bleeding.  SKIN: No rash or lesion.  MUSCULOSKELETAL: No arthritis or muscle cramp.  NEUROLOGIC: No tingling, numbness, weakness.  PSYCHIATRIC: No history of anxiety or depression.   PHYSICAL EXAMINATION: VITAL SIGNS: Temperature 98.3, heart rate 65 per minute, respiration 18 per minute, blood pressure 151/64 mmHg. She is saturating 99% on room air.  GENERAL: The patient is a 64 year old, morbidly obese female lying in the bed comfortably without any acute distress.  EYES: Pupils equal, round, reactive to light and accommodation. No scleral icterus. Extraocular muscles intact.  HENT: Head atraumatic, normocephalic. Oropharynx and nasopharynx clear.  NECK: Supple. No jugular venous distention. No thyroid enlargement or tenderness.  LUNGS: Clear to auscultation bilaterally. No wheezing, rales, rhonchi, or crepitation.  CARDIOVASCULAR: S1, S2 normal. No murmur, rubs, or gallop.  ABDOMEN: Soft, obese, nontender, nondistended. Bowel sounds present. No organomegaly or mass.  EXTREMITIES: No cyanosis or clubbing. She has 3+ pedal edema. SKIN: Warm and dry without rash or lesion.  NEUROLOGIC: Cranial nerves II through XII intact. Muscle strength 5/5 in all extremities. Sensation intact.  PSYCHIATRIC: The patient is alert and oriented x 3.    MUSCULOSKELETAL: No joint effusion or tenderness.   LABORATORY AND RADIOLOGICAL DATA: Normal BMP except BUN of 83, creatinine 4.57, blood sugar 151. BNP of 9370. Normal liver function tests except alkaline phosphatase of 204. CBC showed hematocrit of 27.6.   Chest x-ray in the ED showed no acute cardiopulmonary disease. Cardiomegaly seen.   IMPRESSION AND PLAN: 1.  New end-stage renal disease, now requiring hemodialysis: Case discussed with Dr. Candiss Norse, who is aware and is coordinating catheter placement and dialysis needs.  2.  Gastroesophageal reflux disease: Will continue proton pump inhibitor.  3.  Diabetes mellitus: Will continue home regimen including insulin. Start sliding scale insulin and adjust as needed.  4.  Hypertension: Continue home medication. Monitor and adjust as needed.   CODE STATUS: Full code.   TOTAL TIME TAKING CARE OF THIS PATIENT: 55 minutes.    ____________________________ Lucina Mellow. Manuella Ghazi, MD vss:jcm D: 12/24/2013 14:19:35 ET T: 12/24/2013 15:29:15 ET JOB#: [id number]  cc: Jurney Overacker S. Manuella Ghazi, MD, <Dictator> Dr. Mammie Lorenzo Martie Lee. Oliva Bustard, MD Munsoor Lilian Kapur, MD Remer Macho MD ELECTRONICALLY SIGNED 12/26/2013 19:40

## 2014-11-15 NOTE — Consult Note (Signed)
Chief Complaint:  Subjective/Chief Complaint seen for n/v abdominal pain and hematemesis. doing well, denies abdominal pain, no n/v tolerating po.   VITAL SIGNS/ANCILLARY NOTES: **Vital Signs.:   25-Nov-15 08:24  Vital Signs Type Q 8hr  Temperature Temperature (F) 98.9  Celsius 37.1  Temperature Source oral  Pulse Pulse 84  Respirations Respirations 18  Systolic BP Systolic BP [id number]  Diastolic BP (mmHg) Diastolic BP (mmHg) 73  Mean BP 103  Pulse Ox % Pulse Ox % 100  Pulse Ox Activity Level  At rest  Oxygen Delivery Room Air/ 21 %   Brief Assessment:  Cardiac Regular   Respiratory clear BS   Gastrointestinal details normal Nontender  Nondistended  Bowel sounds normal   Lab Results: Routine Coag:  24-Nov-15 05:38   Prothrombin 13.2  INR 1.0 (INR reference interval applies to patients on anticoagulant therapy. A single INR therapeutic range for coumarins is not optimal for all indications; however, the suggested range for most indications is 2.0 - 3.0. Exceptions to the INR Reference Range may include: Prosthetic heart valves, acute myocardial infarction, prevention of myocardial infarction, and combinations of aspirin and anticoagulant. The need for a higher or lower target INR must be assessed individually. Reference: The Pharmacology and Management of the Vitamin K  antagonists: the seventh ACCP Conference on Antithrombotic and Thrombolytic Therapy. [id number] Sept:126 (3suppl): X2190819. A HCT value >55% may artifactually increase the PT.  In one study,  the increase was an average of 25%. Reference:  "Effect on Routine and Special Coagulation Testing Values of Citrate Anticoagulant Adjustment in Patients with High HCT Values." American Journal of Clinical Pathology 2006;126:400-405.)  Routine Hem:  24-Nov-15 05:38   WBC (CBC) 8.1  RBC (CBC)  3.23  Hemoglobin (CBC)  9.5  Hematocrit (CBC)  29.6  Platelet Count (CBC) 173  MCV 92  MCH 29.4  MCHC 32.0  RDW  16.2   Neutrophil % 54.0  Lymphocyte % 30.5  Monocyte % 12.4  Eosinophil % 2.5  Basophil % 0.6  Neutrophil # 4.4  Lymphocyte # 2.5  Monocyte #  1.0  Eosinophil # 0.2  Basophil # 0.1 (Result(s) reported on 17 Jun 2014 at 07:07AM.)   Assessment/Plan:  Assessment/Plan:  Assessment 1) n/v abdominal pain, hematemesis.  EGD showing reosive gastritis.  H pylori negative.  2) ckd, htn, dm   Plan 1) continue bid po ppi and qid carafate for now, GI o./p fu in 10-14 days.  will sign off, Dr Vira Agar available tomorrow if needed.   Electronic Signatures: Loistine Simas (MD)  (Signed [phone] 14:33)  Authored: Chief Complaint, VITAL SIGNS/ANCILLARY NOTES, Brief Assessment, Lab Results, Assessment/Plan   Last Updated: 25-Nov-15 14:33 by Loistine Simas (MD)

## 2014-11-15 NOTE — Discharge Summary (Signed)
PATIENT NAME:  Cynthia Dean, NORTHWAY MR#:  [id number] DATE OF BIRTH:  July 18, 1951  DATE OF ADMISSION:  12/24/2013 DATE OF DISCHARGE:  12/27/2013  PRIMARY CARE PHYSICIAN: Dr. Mammie Lorenzo, at [facility].   FINAL DIAGNOSES:  1. Acute on chronic diastolic congestive heart failure with fluid overload.  2. Chronic obstructive pulmonary disease exacerbation.  3. End-stage renal disease, started on dialysis.  4. Hypertension.  5. Obesity.  6. Laceration of the right fourth toe.  7. Diabetes.   MEDICATIONS ON DISCHARGE: Include vitamin D2 at 50,000 units 1 capsule 2 times a week on Wednesday, Friday, diazepam 5 mg 1 tablet every 4 hours as needed for agitation, Colace 100 mg once a day, amitriptyline 10 mg 2 tablets at bedtime as needed for anxiety and nervousness, aspirin 81 mg daily, ferrous sulfate 325 mg twice a day, amlodipine 10 mg daily, albuterol ipratropium CFC 1 puff 4 times a day, DuoNeb nebulizer solution 3 mL 3 times a day as needed for shortness of breath, Protonix 40 mg twice a day, gabapentin 300 mg twice a day, insulin aspart 30/70 at 20 units subcutaneous injection twice a day, metoprolol 25 mg 1 tablet twice a day, valsartan 100 mg at bedtime, torsemide 100 mg daily, prednisone 5 mg 5 tablets a day 1, and 4 tablets day 2 and 3 tablets day 3 and  2 tablets day 4 and 1 tablet day 5 then stop, Levaquin 250 mg every other day at bedtime for 7 days. Stop hydralazine. Stop Lasix. Stop ibuprofen.   DIET: Renal diet, regular consistency.   ACTIVITY: As tolerated.   FOLLOWUP: Follow up with dialysis Saturday 1:00 p.m. at [facility]. Follow up 1 to 2 weeks with Dr. Mammie Lorenzo.   HOSPITAL COURSE: The patient was sent in for admission on 12/24/2013, discharged 12/27/2013. Sent in for worsening shortness of breath and lower extremity edema and was going to be started on dialysis. Patient had a dialysis catheter placed on June 3rd, insertion of right internal jugular  tunneled dialysis catheter by Dr. Ronalee Belts. The patient was started on dialysis June 3rd, June 4th, and June 5th.   LABORATORY AND RADIOLOGICAL DATA DURING THE HOSPITAL COURSE: Included a hemoglobin A1c of 8.2. Hepatitis B surface antigen negative. BNP 9370, hematocrit 27.6, glucose 151, BUN 83, creatinine 4.57, sodium 138, potassium 4.6, chloride 108, CO2 of 24, calcium 8.8. Liver function tests: Alkaline phosphatase 204, ALT 24, AST 19.   EKG normal sinus rhythm, no acute ST-T wave changes. Chest x-ray showed cardiomegaly. No edema or consolidation.   LDL 36, HDL 31, triglycerides 53, ferritin 243. Parathyroid hormone intact 742.   Repeat chest x-ray on June 4th showed vascular congestion, mild edema.   HOSPITAL COURSE PER PROBLEM LIST:  1. Acute on chronic diastolic congestive heart failure with fluid overload. The patient did have 3 dialysis sessions here. Dr. Candiss Norse did switch the Lasix over to torsemide. Dialysis and torsemide should help out with fluid management. The patient's metoprolol was decreased to 25 mg twice a day and Diovan was started.  2. For chronic obstructive pulmonary disease exacerbation lungs were clear on the first day that I saw her. On June 4th I did hear wheeze. I did start Solu-Medrol and initially Zithromax. I switched it over to Levaquin and a prednisone taper upon discharge. The patient does wear oxygen at home and has it via the nasal cannula mostly at night.  3. End-stage renal disease. She received 3 dialysis sessions here after Dr.  Schneir put in a catheter. Will follow up as outpatient for dialysis tomorrow and Tuesday, Thursday, Saturday schedule at [facility].  4. Hypertension. Hydralazine and Lasix were stopped. Diovan and torsemide added  5. Obesity, body mass index 50.9. Weight loss is needed.  6. Laceration of the right fourth toe. Too late for his sutures. Can do Neosporin and cover with Band-Aid. At this point, if no improvement, must get to the  foot doctor.  7. Diabetes. I decreased the patient's insulin aspart to 20 units twice a day, insulin does stick around longer with dialysis patients and I wanted to avoid hypoglycemia.   TIME SPENT ON DISCHARGE: 35 minutes.   ____________________________ Tana Conch. Leslye Peer, MD rjw:lt D: 12/27/2013 14:43:30 ET T: 12/27/2013 21:27:09 ET JOB#: [id number]  cc: Tana Conch. Leslye Peer, MD, <Dictator> Mammie Lorenzo, MD Munsoor Lilian Kapur, MD Murlean Iba, MD [place] MD ELECTRONICALLY SIGNED 12/31/2013 10:19

## 2014-11-15 NOTE — Discharge Summary (Signed)
Dates of Admission and Diagnosis:  Date of Admission 14-Jun-2014   Date of Discharge 18-Jun-2014   Admitting Diagnosis Hemetemesis   Final Diagnosis 1.  Hemetemesis 2. Anemia of Chronic Disease  3. ESRD on HD  4. TIA 5. hypertension    Chief Complaint/History of Present Illness PRIMARY CARE PHYSICIAN: Mammie Lorenzo, MD   CHIEF COMPLAINT: Vomiting blood today.   HISTORY OF PRESENT ILLNESS: A 64 year old African American female with a history of hypertension, diabetes, ESRD, who presented to the ED with vomiting blood today. The patient today is alert, awake, oriented, in no acute distress. The patient has ESRD on hemodialysis. She started to have nausea, vomiting and abdominal pain, at the end of hemodialysis today. In addition, she vomited blood. She feels sick and weak. She also complains of headache and dizziness,  but she denies any other bleeding, bruises, hematuria, melena, or bloody stool. The patient was started on Pepcid drip/Protonix drip.   Allergies:  Hydrocodone: N/V  Percocet: N/V  ondansetron: Itching  Tape: Rash  Pertinent Past History:  Pertinent Past History PAST MEDICAL HISTORY: Hypertension, diabetes, ESRD, started 4 months ago on Tuesday/Thursday/Saturday, history of COPD, GERD, obesity.   Hospital Course:  Hospital Course * GI bleed due to gastric erosions on EGD Continue protonix, sucralfate Tolerating diet Hb stable for now.  * diabetes: start sliding scale. Insulin 70/30  *  Hypertension. Continue the patient's home medications.   *  ESRD, neprhology consult, continue hemodialysis.  * Right hand weakness- Due to TIA    Resolved. MRI nothing acute Neurology has seen pt. ASA held due to GI bleed. Need to be restarted on GI f/u if no further bleeding  * Acute blood loss anemia over Anemia of Chronic Disease   Time spent on d/c 40 minutes   Condition on Discharge Fair   PHYSICAL EXAM ON DISCHARGE:  Physical Exam:  GEN no acute  distress, obese   HEENT pale conjunctivae   RESP normal resp effort  clear BS   ABD denies tenderness   LYMPH negative neck   PSYCH alert, A+O to time, place, person   DISCHARGE INSTRUCTIONS HOME MEDS:  Medication Reconciliation: Patient's Home Medications at Discharge:     Medication Instructions  aspirin 81 mg oral enteric coated tablet  1 tab(s) orally once a day   albuterol-ipratropium cfc free 100 mcg-20 mcg/inh inhalation aerosol  2 puff(s) inhaled 2 times a day   vitamin d2 50,000 intl units oral capsule  1 cap(s) orally once a week on Fridays   metoprolol tartrate 25 mg oral tablet  1 tab(s) orally 2 times a day   promethazine 25 mg oral tablet  1 tab(s) orally every 6 hours as needed for nausea.   lyrica 25 mg oral capsule  1 cap(s) orally once a day   diazepam 5 mg oral tablet  1 tab(s) orally every 12 hours as needed for muscle spasm/clonus. *no more than 1 tab every 12 hours*   tramadol 50 mg oral tablet  1 tab(s) orally every 6 to 8 hours as needed for pain.   sensipar 30 mg oral tablet  1 tab(s) orally once a day with meal.   valsartan 160 mg oral tablet  1 tab(s) orally once a day (in the evening)   amitriptyline 25 mg oral tablet  1 to 2 tab(s) orally once a day (at bedtime) for sleep.   novolin 70/30 human recombinant 70 units-30 units/ml subcutaneous suspension  45 to 50 unit(s) subcutaneous 2 times  a day or as directed.   phoslo 667 mg oral capsule  2  orally 3 times a day   plavix 75 mg oral tablet  1 tab(s) orally once a day   torsemide 100 mg oral tablet  1 tab(s) orally once a day   ferrous sulfate 325 mg (65 mg elemental iron) oral tablet  1 tab(s) orally 2 times a day (with meals)   carafate 1 g/10 ml oral suspension  10 milliliter(s) orally 3 times a day (before meals). For gastric ulcer   pantoprazole 40 mg oral delayed release tablet  1 tab(s) orally 2 times a day    STOP TAKING THE FOLLOWING MEDICATION(S):    omeprazole 20 mg oral delayed release  capsule: 1 cap(s) orally 2 times a day  Physician's Instructions:  Diet Renal Diet   Activity Limitations As tolerated   Return to Work Not Applicable   Time frame for Follow Up Appointment 1-2 weeks  primary care provider   Time frame for Follow Up Appointment 1-2 weeks  Dr. Gustavo Lah   Electronic Signatures: Darvin Neighbours, Lottie Dawson (MD)  (Signed [phone] 08:06)  Authored: ADMISSION DATE AND DIAGNOSIS, CHIEF COMPLAINT/HPI, Allergies, PERTINENT PAST HISTORY, HOSPITAL COURSE, PHYSICAL EXAM ON DISCHARGE, [place], PATIENT INSTRUCTIONS   Last Updated: 29-Nov-15 08:06 by Alba Destine (MD)

## 2014-11-15 NOTE — Consult Note (Signed)
Brief Consult Note: Diagnosis: nausea vomiting, coffee ground emesis.   Patient was seen by consultant.   Consult note dictated.   Recommend further assessment or treatment.   Comments: Please see full GI consult. Patient With multiple medical problems but recently started on HD for ESRD.  Patient relates getting nausea on each HD.  She missed a dialysis thursday sut to graft malfunction, this was fixed by Dr Lucky Cowboy on thurs pm.  She then underwent HD 2 serial days friday and today.  Nause and emesis worse today, with some epigastric pain.  Reports small amount of brownish emesis, this occurring once she got to the ER.  Not recurrent.  Recommend continue iv ppi drip.  Patietn takes plavix and low dose asa, both currently held. EGD when clinically feasible, recommend waiting until off plavix for 5 days, could be done sooner if there is an emergent clinical need.  Zofran may be effective with a scheduled dose but patient states it burns in an iv?[name]  icechips.  Following.  Electronic Signatures: Cynthia Dean (MD)  (Signed 21-Nov-15 20:13)  Authored: Brief Consult Note   Last Updated: 21-Nov-15 20:13 by Cynthia Dean (MD)

## 2014-11-15 NOTE — Consult Note (Signed)
Chief Complaint:  Subjective/Chief Complaint seen for coffeeground emesis.  feeling much better this am.  no further emesis.  denies abdominalpain, had bm overnight, no black.   VITAL SIGNS/ANCILLARY NOTES: **Vital Signs.:   22-Nov-15 11:16  Vital Signs Type Routine  Temperature Temperature (F) 97.6  Celsius 36.4  Temperature Source oral  Pulse Pulse 87  Respirations Respirations 18  Systolic BP Systolic BP 98  Diastolic BP (mmHg) Diastolic BP (mmHg) 50  Mean BP 66  Pulse Ox % Pulse Ox % 97  Pulse Ox Activity Level  At rest  Oxygen Delivery 2L  *Intake and Output.:   22-Nov-15 02:40  Stool  reports BM   Brief Assessment:  GEN obese   Cardiac Regular   Respiratory clear BS   Gastrointestinal details normal Soft  Nondistended  Bowel sounds normal  No rebound tenderness  minimal epigastric tenderness to palpation   Lab Results: Routine Chem:  22-Nov-15 00:58   Result Comment HGB - CNL/DUPLICATE/INCLUDED IN HCW/23-76-28  - OKED PER CRYSTAL/RWW  Result(s) reported on 15 Jun 2014 at 01:21AM.  Glucose, Serum  234  BUN  28  Creatinine (comp)  4.62  Sodium, Serum 136  Potassium, Serum 4.8  Chloride, Serum 101  CO2, Serum 24  Calcium (Total), Serum  7.3  Anion Gap 11  Osmolality (calc) 285  eGFR (African American)  12  eGFR (Non-African American)  10 (eGFR values <13m/min/1.73 m2 may be an indication of chronic kidney disease (CKD). Calculated eGFR, using the MRDR Study equation, is useful in  patients with stable renal function. The eGFR calculation will not be reliable in acutely ill patients when serum creatinine is changing rapidly. It is not useful in patients on dialysis. The eGFR calculation may not be applicable to patients at the low and high extremes of body sizes, pregnant women, and vegetarians.)  Magnesium, Serum  1.5 (1.8-2.4 THERAPEUTIC RANGE: 4-7 mg/dL TOXIC: > 10 mg/dL  -----------------------)  Hemoglobin A1c ([facility])  7.9 (The American Diabetes  Association recommends that a primary goal of therapy should be <7% and that physicians should reevaluate the treatment regimen in patients with HbA1c values consistently >8%.)  Cardiac:  22-Nov-15 00:58   Troponin I < 0.02 (0.00-0.05 0.05 ng/mL or less: NEGATIVE  Repeat testing in 3-6 hrs  if clinically indicated. >0.05 ng/mL: POTENTIAL  MYOCARDIAL INJURY. Repeat  testing in 3-6 hrs if  clinically indicated. NOTE: An increase or decrease  of 30% or more on serial  testing suggests a  clinically important change)  Routine Hem:  21-Nov-15 10:26   Hemoglobin (CBC)  11.7    16:52   Hemoglobin (CBC)  11.5 (Result(s) reported on 14 Jun 2014 at 05:15PM.)    21:01   Hemoglobin (CBC)  10.5 (Result(s) reported on 14 Jun 2014 at 09:34PM.)  22-Nov-15 00:58   Hemoglobin (CBC) -  Hemoglobin (CBC)  10.6  WBC (CBC)  11.8  RBC (CBC)  3.69  Hematocrit (CBC)  33.4  Platelet Count (CBC) 197  MCV 91  MCH 28.7  MCHC  31.7  RDW  16.2  Neutrophil % 73.5  Lymphocyte % 15.8  Monocyte % 9.3  Eosinophil % 0.1  Basophil % 1.3  Neutrophil #  8.7  Lymphocyte # 1.9  Monocyte #  1.1  Eosinophil # 0.0  Basophil #  0.2 (Result(s) reported on 15 Jun 2014 at 01:19AM.)   Radiology Results: XRay:    21-Nov-15 14:08, Abdomen 3 Way Includes PA Chest  Abdomen 3 Way Includes PA Chest  REASON FOR EXAM:    no flatus x3 days, coffee ground emesis  COMMENTS:       PROCEDURE: DXR - DXR ABDOMEN 3-WAY (INCL PA CXR)  - Jun 14 2014  2:08PM     CLINICAL DATA:  Nausea beginning at dialysis today with abdominal  pain and vomiting coffee ground emesis.    EXAM:  ABDOMEN SERIES    COMPARISON:  Chest x-ray 05/19/2014 and CT abdomen/pelvis 05/12/2013    FINDINGS:  Lungs are adequately inflated with mild prominence of the perihilar  markings without significant change likely mild chronic vascular  congestion. No evidence of effusion. Cardiomediastinal silhouette  and remainder of the chest is  unchanged.    Abdominal pelvic images demonstrate a nonobstructive bowel gas  pattern with mild fecal retention throughout the colon. No evidence  offree peritoneal air. Mild degenerative change of the spine and  hips.     IMPRESSION:  Nonobstructive bowel gas pattern with mild fecal retention  throughout the colon.    Mild prominence of the perihilar markings without significant change  likely amild degree of vascular congestion.  Electronically Signed    By: Marin Olp M.D.    On: 06/14/2014 14:16         Verified By: Pearletha Alfred, M.D.,  CT:    22-Nov-15 00:19, CT Head Without Contrast  CT Head Without Contrast   REASON FOR EXAM:    acute onset right arm weakness  COMMENTS:       PROCEDURE: CT  - CT HEAD WITHOUT CONTRAST  - Jun 15 2014 12:19AM     CLINICAL DATA:  Episode of right upper arm and hand weakness today.  Symptoms have subsequently resolved. No history of CVA. Initial  encounter.    EXAM:  CT HEAD WITHOUT CONTRAST    TECHNIQUE:  Contiguous axial images were obtained from the base of the skull  through the vertex without intravenous contrast.  COMPARISON:  None.    FINDINGS:  There is no evidenceof acute intracranial hemorrhage, mass lesion,  brain edema or extra-axial fluid collection. The ventricles and  subarachnoid spaces are appropriately sized for age. There is no CT  evidence of acute cortical infarction. There are probable chronic  small vessel ischemic changes within the cerebellum. Intracranial  vascular calcifications are noted.    The visualized paranasal sinuses, mastoid air cells and middle ears  are clear. The calvarium is intact.     IMPRESSION:  No CT evidence of acute stroke or other acute intracranial process.  Probable chronic small vessel ischemic changes within the  cerebellum.      Electronically Signed    By: Camie Patience M.D.    On: 06/15/2014 00:42         Verified By: Vivia Ewing, M.D.,    Assessment/Plan:  Assessment/Plan:  Assessment 1) n/v episode of small amount of coffeeground emesis.  not repeated, no melena.  abd pain , nausea and emesis resoolved at present, wanting to eat.  2) episodic n associated with HD.   Plan 1) continue protonix, change to iv bid for today.   2) egd when clinically feasible.  patietn on plavix for 2 days before coming to hospital, now held.  Will plan egd for tuesday.  I have discussed the risks benefits and complications of egd to include not limited to bleeding infection perforationa dn sedationa nd she wishes to proceed.   Electronic Signatures: Loistine Simas (MD)  (Signed [phone] 14:14)  Authored: Chief Complaint,  VITAL SIGNS/ANCILLARY NOTES, Brief Assessment, Lab Results, Radiology Results, Assessment/Plan   Last Updated: 22-Nov-15 14:14 by Loistine Simas (MD)

## 2014-11-15 NOTE — H&P (Signed)
PATIENT NAME:  Cynthia Dean, Cynthia Dean MR#:  [id number] DATE OF BIRTH:  December 29, 1950  DATE OF ADMISSION:  05/17/2014  PRIMARY CARE PHYSICIAN: Mammie Lorenzo, MD   NEPHROLOGIST: Dr. Candiss Norse.   CHIEF COMPLAINT: Intractable nausea and vomiting.   HISTORY OF PRESENT ILLNESS: The patient is a 64 year old female with ESRD on hemodialysis, had intractable nausea and vomiting which started after an hour of dialysis today. The patient could not finish the dialysis today and the patient felt severely nauseated with epigastric pain and vomited multiple times. The patient did not have any diarrhea. The last BM was last night, which was normal. The patient received a GI cocktail and also Reglan 10 mg and 4 mg of morphine in the emergency room, still nauseous and actively vomiting. Because of that, we were asked to admit the patient, noted to have elevated white count at 13 with UTI. The patient denies any chest pain. No trouble breathing. No cough. Nausea and vomiting. Everything started today.   The patient had previous episodes like this nausea and vomiting concerning for gastritis. The patient was here a week ago in the ER. At that time, an ultrasound was done for the abdomen which was normal and the patient's lipase was slightly up at 520 on 10/11. Her lipase is normal today. The patient has mild epigastric pain. The pain is not radiating to the back.   PAST MEDICAL HISTORY: Significant for hypertension, diabetes, ESRD on hemodialysis Tuesday, Thursday, Saturday. She also has a history of COPD, admitted in June for trouble breathing. The patient has a history of obesity and GERD.  ALLERGIES: HYDROCODONE, PERCOCET AND ZOFRAN.   SOCIAL HISTORY: Previous smoker. No drugs. No alcohol.   FAMILY HISTORY: Significant for diabetes and coronary artery disease.   PAST SURGICAL HISTORY: None.  MEDICATIONS: Diazepam 5 mg every 4 hours, as needed for anxiety, Colace 100 mg b.i.d., amitriptyline 10 mg 2 tablets at bedtime,  aspirin 81 mg daily, amlodipine 10 mg p.o. daily, Combivent Respimat 1 puff 4 times daily, pantoprazole 40 mg p.o. b.i.d., valsartan 160 mg p.o. daily, metoprolol 25 mg p.o. b.i.d., insulin 70/30 forty units in the morning and 45 units at bedtime, Phenergan 25 mg every 6 hours as needed, , torsemide 100 mg on Monday, Wednesday, Friday and Sunday.   REVIEW OF SYSTEMS:  CONSTITUTIONAL: No fever. No fatigue.  EYES: No blurred vision. No inflammation.  ENT: No tinnitus. No ear pain. No epistaxis. No difficulty swallowing.  RESPIRATIONS: No cough. No wheezing.  CARDIOVASCULAR: No chest pain. No orthopnea. No PND. No pedal edema. GASTROINESTINAL: The patient has nausea, vomiting, diarrhea and abdominal pain since this morning. GENITOURINARY: No dysuria. She still makes urine. Denies any trouble with urination or burning on urination.  ENDOCRINE: No polyuria or nocturia.  HEMATOLOGIC: No anemia.  INTEGUMENTARY: No skin rashes.  MUSCULOSKELETAL: Denies any joint pain.  NEUROLOGIC: Has a history of neuropathy.  PSYCHIATRIC: No anxiety or insomnia.   PHYSICAL EXAMINATION: VITAL SIGNS: Temperature 97.2, heart rate 93, blood pressure 192/83, saturations 93% on room air.  General: Alert, awake, oriented, 64 year old obese female, very uncomfortable because of nausea and continuous vomiting. Well-developed, well-nourished.  HEAD: Normocephalic, atraumatic.  EYES: Pupils equal, reacting to light. No conjunctival pallor. No icterus.  NOSE: No rhinitis, no drainage.  EARS: No nasal lesions. No drainage. MOUTH: No lesions. No exudates. NECK: Supple. No JVD. No carotid bruits.  RESPIRATORY: Good respiratory effort. No wheezing. The patient did have wheezing earlier today when she vomited. According to the  ER physician, she had wheezing at that time, but none when I examined her.  CARDIOVASCULAR: Regular rate and rhythm. No murmurs. No gallops.  GASTROINTESTINAL: Abdomen is soft. Mild epigastric tenderness  present. No hepatosplenomegaly. No hernias. Bowel sounds are diminished.  MUSCULOSKELETAL: Normal gait and station. Able to move all extremities.  SKIN: Inspection is normal, well-hydrated.  NEUROLOGIC: Cranial nerves 2 through 12 are intact. Power 5/5 in upper and lower extremities. DTRs 2+ bilaterally.   LABORATORY DATA: UA is cloudy, has 2+ leukocyte esterase and 3+ bacteria. The patient has a WBC of 10 in the urine. WBC 13.4, hemoglobin 13.9, hematocrit 45.2, platelets 179,000. Electrolytes: Sodium 131, potassium 4.9, chloride 99, bicarbonate 119, BUN 17, creatinine 5.4 and glucose 204. BNP 1145. Lipase 326.   DIAGNOSTIC DATA: Chest x-ray shows enlargement of cardiac silhouette with pulmonary vascular congestion, mild bibasilar atelectasis. The patient's EKG shows normal sinus rhythm with 87 beats per minute. No ST-T changes.   ASSESSMENT AND PLAN: 1. The patient is a 64 year old, African-American female with intractable nausea, vomiting and epigastric pain. Symptoms are concerning for acute gastritis. Admit the patient to hospitalist service. Continue intravenous fluids, intravenous proton pump inhibitors and intravenous Reglan and also Phenergan.  UTI;continue rocephin 2. End-stage renal disease on hemodialysis. Consult nephrology for her dialysis needs.  3. Hypertension. The patient's blood pressure medications are restarted.  4. Diabetes mellitus type 2. Right now she is vomiting continuously so hold her 70/30 and continue sliding scale with coverage.  5. History of chronic obstructive pulmonary disease. No wheezing at this time. Continue her inhalers.  6. History of neuropathy. Continue Lyrica.  7. Hyperlipidemia. Continue statins after her nausea gets better.  8. Because of her nausea and vomiting, we are avoiding holding her torsemide and valsartan at this time.   TIME SPENT: 60 minutes.     ____________________________ Epifanio Lesches, MD sk:TT D: 05/17/2014 17:25:42  ET T: 05/17/2014 18:33:16 ET JOB#: [id number]  cc: Epifanio Lesches, MD, <Dictator> Dr. Mammie Lorenzo [place] High, MD  Epifanio Lesches MD ELECTRONICALLY SIGNED 06/23/2014 11:25

## 2014-11-15 NOTE — Op Note (Signed)
PATIENT NAME:  Cynthia Dean, Cynthia Dean MR#:  [id number] DATE OF BIRTH:  08/28/1950  DATE OF PROCEDURE:  04/23/2014  PREOPERATIVE DIAGNOSIS: End-stage renal disease with functional permanent dialysis access.   POSTOPERATIVE DIAGNOSIS: End-stage renal disease with functional permanent dialysis access.   PROCEDURE: Removal of right jugular PermCath.  SURGEON:  Algernon Huxley, M.D.    ANESTHESIA:  Local.  ESTIMATED BLOOD LOSS:  Minimal.  INDICATION FOR PROCEDURE: This is a 64 year old African American female with end-stage renal disease. Her permanent dialysis access is working well, and her catheter can be removed. The risks and benefits were discussed. Informed consent was obtained.   DESCRIPTION OF PROCEDURE:  The patient's right neck, chest and existing catheter were sterilely prepped and draped.  The area around the catheter was anesthetized copiously with 1% Lidocaine. The catheter was dissected out with curved hemostats until the cuff was freed from the surrounding fibrous sheath.  The fibrous sheath was transected and the catheter was then removed in its entirety using gentle traction.  Pressure was held and sterile dressings placed.    The patient tolerated the procedure well and was taken to the recovery room in stable condition.    ____________________________ Algernon Huxley, MD jsd:JT D: 04/23/2014 10:52:19 ET T: 04/23/2014 14:06:09 ET JOB#: [id number]  cc: Algernon Huxley, MD, <Dictator> Algernon Huxley MD ELECTRONICALLY SIGNED 05/06/2014 10:20

## 2014-11-15 NOTE — Op Note (Signed)
PATIENT NAME:  Cynthia Dean, Cynthia Dean MR#:  [id number] DATE OF BIRTH:  November 14, 1950  DATE OF PROCEDURE:  12/25/2013  PREOPERATIVE DIAGNOSES:  1. End-stage renal disease requiring hemodialysis.   2. Volume overload with mild hypoxia.  3. Anasarca.  4. Morbid obesity.   POSTOPERATIVE DIAGNOSES:  1. End-stage renal disease requiring hemodialysis.   2. Volume overload with mild hypoxia.  3. Anasarca.  4. Morbid obesity.   PROCEDURE PERFORMED: Insertion of right internal jugular tunneled dialysis catheter with ultrasound and fluoroscopic guidance.   SURGEON: Katha Cabal, MD  SEDATION: Versed 3 mg plus fentanyl 100 mcg administered IV. Continuous ECG, pulse oximetry and cardiopulmonary monitoring was performed throughout the entire procedure by the interventional radiology nurse. Total sedation time was 40 minutes.   ACCESS: Right IJ.   CONTRAST USED: None.   FLUOROSCOPY TIME: Less than 1 minute.   INDICATIONS: Ms. Giangrande is a 64 year old woman who is presenting with end-stage renal failure and will require hemodialysis. She is therefore undergoing placement of access. Risks and benefits are reviewed. All questions answered. The patient agrees to proceed.   DESCRIPTION OF PROCEDURE: The patient is taken to the special procedure suite, placed in the supine position. After adequate sedation is achieved, her right neck and chest wall are prepped and draped in a sterile fashion. Ultrasound is placed in a sterile sleeve. Ultrasound is utilized secondary to lack of appropriate landmarks and to avoid vascular injury. Under direct visualization, jugular vein is identified. It is echolucent and compressible, indicating patency. Image is recorded, and under real-time ultrasound viewing, microneedle is inserted, microwire followed by micro sheath. J-wire was then advanced into the inferior vena cava under fluoroscopic guidance. A counterincision is created at the wire insertion site. Dilator is passed  over the wire and the peel-away sheath inserted. Dilator and wire are removed, and a 19 cm tip to cuff Cannon catheter is advanced through the peel-away sheath. The peel-away sheath is removed. Under fluoroscopic guidance, the tips are positioned at the atriocaval junction and exit site is selected. Incision is made. Tunneling device is passed subcutaneously. The tract is dilated, and the catheter is pulled subcutaneously under fluoroscopic guidance. It is adjusted for tip position. The hub is then connected. Both lumens aspirate and flush easily, and the catheter is smooth in contour with its tips in the proper location, and therefore 5000 units of heparin per lumen is instilled. The catheter is secured to the chest wall with 2-0 silk. Pursestring suture of 2-0 nylon is placed around the exit site. The neck counterincision is closed with a 4-0 Monocryl subcuticular and then Dermabond. Sterile dressing is applied. The patient tolerated the procedure well, and there were no immediate complications.   ____________________________ Katha Cabal, MD ggs:lb D: 12/25/2013 10:47:14 ET T: 12/25/2013 10:53:33 ET JOB#: [id number]  cc: Katha Cabal, MD, <Dictator> Katha Cabal MD ELECTRONICALLY SIGNED 01/07/2014 9:18

## 2014-11-15 NOTE — Op Note (Signed)
PATIENT NAME:  Cynthia Dean, Cynthia Dean MR#:  [id number] DATE OF BIRTH:  1951-04-12  DATE OF PROCEDURE:  02/21/2014  PREOPERATIVE DIAGNOSIS: Stage V renal insufficiency.   POSTOPERATIVE DIAGNOSIS: Stage V renal insufficiency.  PROCEDURE PERFORMED: Creation of left arm radial axillary arteriovenous graft.   SURGEON: Hortencia Pilar, M.D.   ANESTHESIA: General by endotracheal intubation.   FLUIDS: Per anesthesia record.   ESTIMATED BLOOD LOSS: Minimal.   SPECIMEN: None.   INDICATIONS: Ms. Redeker is a 64 year old woman who requires hemodialysis and now is undergoing placement of an upper extremity access. Risks and benefits are reviewed. All questions are answered. The patient agrees to proceed.   DESCRIPTION OF PROCEDURE: The patient is taken to the operating room and placed in the supine position. After adequate general anesthesia is induced, she is positioned supine with her left arm extended palm upward. The left arm is prepped and draped in a sterile fashion.   Ultrasound is placed in a sterile sleeve and the veins of the antecubital fossa are examined. The patient has an anatomic variation with a very high takeoff of the radial artery, which originates just below the level of the axilla.  The paired veins with the radial artery are noted, but both the radial artery and the veins are quite small, proportionally so. The brachial artery then is identified more deeply. This is also a smaller then you would anatomically expect and commiserate with the veins. There does not appear to be any vein in the forearm area that is larger than 3 mm, and therefore, she does not have acceptable anatomy for a forearm loop graft.   Incision is then made in the medial arm just above the antecubital fossa, and carried down through the soft tissues. The anatomic radial artery is identified. As noted from the ultrasound, this is quite small. Further dissection demonstrates that the true brachial artery is also quite  small, measuring 3.5 to 4 mm at most. It is quite deep and tucked underneath the biceps muscle. Given the size discrepancy between these 2 arteries, as well as the anatomic positioning of the true brachial artery, which is going to create a very sharp angulation to the graft, I have elected to use the radial artery as the arterial inflow for the graft, which will have a much more consistent course, more typical of a brachial axillary graft. The artery is then looped proximally and distally.   A linear incision is then made in the axilla along the anterior axillary line and dissection carried down to expose the axillary vein. Once again, the finding is a very small axillary vein measuring, perhaps, 3-4 mm at the most. Therefore, I elected to extend the incision more proximally and extend the dissection to a point where the axillary vein now appears to be approximately 6 mm in diameter and more adequate for an anastomosis. The vein is then looped proximally and distally.   Four to 7 mm tapered Propaten graft is delivered onto the field. Gore tunneler is used to create a subcutaneous tunnel and the Gore graft is pulled through the subcutaneous path.   The 4 mm segment is beveled and the radial artery is controlled proximally and distally, is opened with an #11 blade and then Potts scissors.  Stay sutures of 6-0 Prolene are placed and an end graft to side radial artery anastomosis is fashioned using running CV-6 suture. Flushing maneuvers are performed and flow is established back to the hand. It is also now allowed to pressurize  the graft. The graft is then adjusted for positioning so that there is no tension on the radial artery.  It is then flushed with heparinized saline and clamped just above the suture line. It is approximated to the axillary vein and marked with a marker. The graft is then beveled.  The axillary vein is then controlled.  In order to control the axillary vein at such a high level, vascular  clamp is used to clamp it proximally as opposed to the vessel loop, which is tethering it up too much.  More distally, the vein is controlled with a vessel loop. Venotomy is then made. Prolene stay sutures are placed and the graft is beveled and applied in an end-to-graft-to-side vein anastomosis using running CV-6 suture. Flushing maneuvers are performed and flow is established through the graft. The graft has an excellent thrill. Both wounds are then irrigated with sterile saline and closed in layers using 3-0 Vicryl in a running fashion followed by 4-0 Monocryl subcuticular and then Dermabond. The patient tolerated the procedure well. There were no immediate complications. Sponge and needle counts are correct, and she is taken to the recovery room in stable condition.   ____________________________ Katha Cabal, MD ggs:ts D: 02/21/2014 10:10:38 ET T: 02/21/2014 12:31:46 ET JOB#: [id number]  cc: Katha Cabal, MD, <Dictator> Katha Cabal MD ELECTRONICALLY SIGNED 03/04/2014 17:16

## 2014-11-15 NOTE — Op Note (Signed)
PATIENT NAME:  Cynthia Dean, Cynthia Dean MR#:  [id number] DATE OF BIRTH:  [birth year]  DATE OF PROCEDURE:  06/12/2014  PREOPERATIVE DIAGNOSES:  1. End-stage renal disease.  2. Clotted left arm arteriovenous graft.  3. Hypertension.  4. Diabetes.   POSTOPERATIVE DIAGNOSES:  1. End-stage renal disease.  2. Clotted left arm arteriovenous graft.  3. Hypertension.  4. Diabetes.   PROCEDURES PERFORMED:  1. Ultrasound guidance for vascular access, both in antegrade and retrograde fashion to the left arm arteriovenous graft.  2. Left upper extremity shuntogram and central venogram.  3. Catheter directed thrombolysis with 4 mg of tPA throughout the arteriovenous graft.  4. Mechanical rheolytic thrombectomy of the arteriovenous graft.  5. Percutaneous transluminal angioplasty for residual thrombus and stenosis in the midportion of the arteriovenous graft.  6. Percutaneous transluminal angioplasty for residual thrombus and stenosis at the distal portion of the venous anastomosis.  7. Viabahn covered stent placement for residual thrombus in the mid to distal portion of the arteriovenous graft but not into the axillary vein with 8 mm diameter x 15 cm in length Viabahn covered stent.   SURGEON: Leotis Pain, MD   ANESTHESIA: Local with moderate conscious sedation.   ESTIMATED BLOOD LOSS: 25 mL.   INDICATION FOR PROCEDURE: A 64 year old female with end-stage renal disease. Her left arm AV graft is clotted and we are trying to salvage this. Risk and benefits were discussed. Informed consent was obtained.  DESCRIPTION OF PROCEDURE: The patient was prepped in the vascular suite. The left upper extremity was sterilely prepped and draped and a sterile surgical field was created. The graft was accessed in antegrade and retrograde fashion under direct ultrasound guidance with a micropuncture needle. Micropuncture wire and sheath were then placed. This was under direct ultrasound guidance due to the pulseless nature of  the graft and permanent images were recorded. 6 French sheaths were placed. The patient was given 4000 units of intravenous heparin. The graft was thrombosed. Wires were placed through the brachial artery and the axillary vein and 4 mg of t-PA were used to instill through the graft from the arterial anastomosis into the axillary vein. This was allowed to dwell and mechanical rheolytic thrombectomy was performed over the same areas. This uncovered what appeared to be stenosis and thrombus of the venous anastomosis. The graft had a large portion of thrombus in the midsegment and a lesser amount of thrombus in the distal segment. After mechanical rheolytic thrombectomy was performed, I performed an angioplasty with 7 mm diameter angioplasty balloon from the midportion of the graft, distal portion of the graft, and the venous anastomosis all separately. The venous anastomosis was also treated with an 8 mm diameter angioplasty balloon in the axillary vein and this area responded nicely angioplasty; however, the mid and distal portions of the graft did not. I upsized to a 7 [place] sheath and used an 8 mm diameter x 15 cm length Viabahn covered stent from the mid graft to the distal graft and postdilated this with an 8 mm balloon with an excellent angiographic completion result and no significant residual stenosis. At this point, the central venous circulation was patent. The second sheath was removed, 4-0 Monocryl pursestring suture was placed. Pressure was held. Sterile dressing was placed. The patient tolerated the procedure well and was taken to the recovery room in stable condition.    ____________________________ Algernon Huxley, MD jsd:bm D: 06/12/2014 17:08:00 ET T: 06/13/2014 04:14:30 ET JOB#: [id number]  cc: Algernon Huxley, MD, <Dictator> Corene Cornea  Bunnie Domino MD ELECTRONICALLY SIGNED 07/06/2014 11:40

## 2014-11-15 NOTE — Op Note (Signed)
PATIENT NAME:  Cynthia Dean, Cynthia Dean MR#:  [id number] DATE OF BIRTH:  September 26, 1950  DATE OF PROCEDURE:  05/14/2014  PREOPERATIVE DIAGNOSES:  1. Complication of arteriovenous dialysis device.  2. End-stage renal disease requiring hemodialysis.  3. Hypertension.   POSTOPERATIVE DIAGNOSES:  1. Complication of arteriovenous dialysis device.  2. End-stage renal disease requiring hemodialysis.  3. Hypertension.   PROCEDURES PERFORMED:  1. Contrast injection of left brachial axillary dialysis graft.  2. Stent placement midportion of the AV graft for stricture associated with the cannulation zone.  3. Percutaneous transluminal angioplasty of the axillary vein and venous anastomosis. This is a separate and distinct lesion from the stented lesion and is not stented.  4. Percutaneous transluminal angioplasty of the arterial anastomosis to 4 mm.  5. Placement of a sheath in the AV graft x 2, both antegrade and retrograde.   SURGEON: Katha Cabal, M.D.   SEDATION: Versed 4 mg plus fentanyl 150 mcg administered IV. Continuous ECG, pulse oximetry and cardiopulmonary monitoring was performed throughout the entire procedure by the interventional radiology nurse. Total sedation time is 1 hour.   ACCESS:  1. A 7 French sheath, retrograde direction, left arm brachial axillary dialysis graft.  2. A 6 French sheath, antegrade direction, left arm brachial axillary dialysis graft.   CONTRAST USED: Isovue 38 mL.   FLUOROSCOPY TIME: 3.0 minutes.   INDICATIONS: Cynthia Dean is a 64 year old woman maintained with a left arm brachial axillary dialysis graft, who has recently been having increasing problems. Physical examination, as well as noninvasive studies demonstrate significant problems with the graft. The risks and benefits for angiography with intervention for preservation of her graft and access were reviewed. All questions were answered. The patient agrees to proceed.   DESCRIPTION OF PROCEDURE: The  patient was taken to special procedures and placed in the supine position. After adequate sedation is achieved, she is positioned supine with her left arm extended palm upward. The left arm was prepped and draped in a sterile fashion. Appropriate timeout was called.   Access is obtained near the level of the deltoid in a retrograde direction. After 1% lidocaine is infiltrated in the soft tissues, and a micropuncture needle is inserted, a microwire followed by a micro sheath, J-wire followed by a 7 [place] sheath. A floppy Glidewire and Kumpe catheter were negotiated into the brachial artery and hand injection of contrast used to demonstrate the mid and distal brachial artery, as well as the AV graft. 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 Essentially, this is at the apex of the arch of the brachioaxillary graft; 3000 units of heparin is given. A Kumpe catheter is reintroduced. It should also be noted that high-grade narrowing at the anastomosis was also identified as the Kumpe catheter is near occlusive at the actual anastomotic site. As noted, the Kumpe catheter is reintroduced and a V-18 wire was advanced. An 8 x 50 Viabahn stent is then advanced over the lesion. It is deployed without difficulty and postdilated with an 8 x 60 Dorado balloon.   A 4 x 2 Rival balloon is then advanced over the V-18 wire, across the anastomosis, extending into the brachial artery by approximately 10 mm and inflated to 12 atmospheres for 1 full minute. Follow-up imaging through a Kumpe catheter in the brachial artery now demonstrates wide patency to the arterial anastomosis, which the proximal graft and resolution of  the mid graft stricture/stenosis.   Lidocaine 1% is infiltrated in the soft tissues over the graft, just above the arterial anastomosis, and access is obtained with a  micropuncture needle in antegrade direction, microwire followed by micro sheath, J-wire followed by a 6 French sheath. Magic torque wire was then advanced through the graft itself and out into the central venous area. The 8 x 6 Dor[place] balloon is then advanced across the venous anastomosis and distal axillary artery and inflated to 16 atmospheres for 3 minutes. Follow-up imaging demonstrates an excellent result with significant improvement in the flow.   Wire and balloon are removed, pursestring sutures are placed around both sheaths, and they are removed individually. Light pressure is held and there are no immediate complications.   INTERPRETATION: Initial views of the AV graft, both from the brachial artery catheter positioning, as well as from the antegrade sheath later in the case, demonstrate multiple strictures,  3 separate distinct strictures, as described, are in the midportion of the graft in the axillary vein, the venous anastomosis, and at the actual arterial anastomosis. These were all treated, as noted above, with an excellent result, and there is now rapid flow of contrast. Central veins are widely patent.   SUMMARY: Successful salvage of left arm brachial axillary dialysis graft.    ____________________________ Katha Cabal, MD ggs:JT D: 05/14/2014 16:57:00 ET T: 05/15/2014 08:41:30 ET JOB#: [id number]  cc: Mammie Lorenzo, M.D. Katha Cabal, MD, <Dictator>   Katha Cabal MD ELECTRONICALLY SIGNED 05/28/2014 11:45

## 2014-11-15 NOTE — Consult Note (Signed)
Chief Complaint:  Subjective/Chief Complaint seen for n/v epigastric pain, hematemesis.   Plese see EGD report.  Multiple moderate erosions in the upper stomach.   Gastric mucosa friable with contact bleeding.   Continue bid 40 mg protonix, add carafate one gram qid, full liquids, advance to soft tomorrow.  Obtain h pylori serology and stool antigen if serology negative.  Biopsies not done due to recent anticoagulation, and easily bleeding.   VITAL SIGNS/ANCILLARY NOTES: **Vital Signs.:   24-Nov-15 08:01  Vital Signs Type Q 8hr  Temperature Temperature (F) 97.7  Celsius 36.5  Temperature Source oral  Pulse Pulse 86  Respirations Respirations 18  Systolic BP Systolic BP [id number]  Diastolic BP (mmHg) Diastolic BP (mmHg) 65  Mean BP 87  Pulse Ox % Pulse Ox % 93  Pulse Ox Activity Level  At rest  Oxygen Delivery 1L   Brief Assessment:  GEN obese   Cardiac Regular   Respiratory clear BS   Gastrointestinal details normal Soft  Nondistended  Bowel sounds normal  tender to palpation in the epigastrum   Lab Results: Routine Coag:  24-Nov-15 05:38   Prothrombin 13.2  INR 1.0 (INR reference interval applies to patients on anticoagulant therapy. A single INR therapeutic range for coumarins is not optimal for all indications; however, the suggested range for most indications is 2.0 - 3.0. Exceptions to the INR Reference Range may include: Prosthetic heart valves, acute myocardial infarction, prevention of myocardial infarction, and combinations of aspirin and anticoagulant. The need for a higher or lower target INR must be assessed individually. Reference: The Pharmacology and Management of the Vitamin K  antagonists: the seventh ACCP Conference on Antithrombotic and Thrombolytic Therapy. [id number] Sept:126 (3suppl): [id number]. A HCT value >55% may artifactually increase the PT.  In one study,  the increase was an average of 25%. Reference:  "Effect on Routine and Special Coagulation  Testing Values of Citrate Anticoagulant Adjustment in Patients with High HCT Values." American Journal of Clinical Pathology 2006;126:400-405.)  Routine Hem:  24-Nov-15 05:38   WBC (CBC) 8.1  RBC (CBC)  3.23  Hemoglobin (CBC)  9.5  Hematocrit (CBC)  29.6  Platelet Count (CBC) 173  MCV 92  MCH 29.4  MCHC 32.0  RDW  16.2  Neutrophil % 54.0  Lymphocyte % 30.5  Monocyte % 12.4  Eosinophil % 2.5  Basophil % 0.6  Neutrophil # 4.4  Lymphocyte # 2.5  Monocyte #  1.0  Eosinophil # 0.2  Basophil # 0.1 (Result(s) reported on 17 Jun 2014 at 07:07AM.)   Assessment/Plan:  Assessment/Plan:  Assessment 1) nausea vomiting and epigastric pain, episode of hematemesis pta.  not recurrent.  EGD showing multiple erosions in the upper half of the stomach and griable gastric mucosa.  see recommendations above.   Electronic Signatures: Loistine Simas (MD)  (Signed [phone] 15:30)  Authored: Chief Complaint, VITAL SIGNS/ANCILLARY NOTES, Brief Assessment, Lab Results, Assessment/Plan   Last Updated: 24-Nov-15 15:30 by Loistine Simas (MD)

## 2014-11-15 NOTE — Discharge Summary (Signed)
PATIENT NAME:  Cynthia Dean, ZANINI MR#:  [id number] DATE OF BIRTH:  [birth year]  DATE OF ADMISSION:  05/17/2014 DATE OF DISCHARGE:  05/20/2014  DISCHARGE DIAGNOSES:  1.  Gastritis with vomiting.  2.  End-stage renal disease.  3.  Anemia of chronic disease.  4.  Hypertension.  5.  Pulmonary edema due to missing hemodialysis.  6.  Chronic obstructive pulmonary disease.  7.  Acute respiratory failure with likely chronic respiratory failure.   DISCHARGE MEDICATIONS:  1.  Aspirin 81 mg daily.  2.  Omeprazole 20 mg 2 times a day.  3.  DuoNeb 2 puffs inhaled 2 times a day.  4.  Vitamin D2 50,000 international units once a week.  5.  Acetaminophen/oxycodone 325/5 one to 2 tablets every 6 hours as needed for pain.  6.  Torsemide 100 mg a day.  7.  Metoprolol tartrate 25 mg 2 times a day.  8.  Promethazine 25 mg oral every 6  hours as needed for nausea.  9.  Lyrica 25 mg oral once a day.  10.  Diazepam 5 mg 2 times a day as needed.  11.  Tramadol 50 mg every 6 hours as needed for pain.  12.  Sensipar 30 mg oral once a day with meals.  13.  Valsartan 160 mg daily.  14.  Amitriptyline 25 mg oral daily at bedtime.  15.  Novolin 70/30 forty-five to 50 units subcutaneous 2 times a day.  16.  Levaquin 250 mg oral every other day for 6 days.  17.  Prednisone 60 mg tapered over 6 days.   DISCHARGE INSTRUCTIONS: Use home oxygen 1 liter continuous, renal diet, regular consistency, followup with primary care physician in 1 to 2 weeks and continue hemodialysis as before.   CONSULTS: Dr. Candiss Norse and Tama High, MD with nephrology.   IMAGING STUDIES: Include a chest x-ray which showed pulmonary edema.   ADMITTING HISTORY AND PHYSICAL: Please see detailed H and P dictated by Dr. Vianne Bulls. In brief, a 64 year old African American female patient who was admitted to the hospitalist service after she has had recurrent nausea, vomiting with her dialysis. The patient was admitted to the hospitalist service  for possible gastritis or a reaction during dialysis, for hypertension.    HOSPITAL COURSE:  1.  Nausea or vomiting. This was thought to be secondary to gastritis or a reaction from her dialysis. The patient has had no issues during the hospital stay, has tolerated her food well. By the time of discharge no nausea. Will be given nausea medication to use p.r.n. The patient likely had reaction to her dialysis.  2.  Acute respiratory failure with COPD and pulmonary edema. The patient missed her dialysis secondary to the nausea, vomiting, had some fluid overload, had acute respiratory failure secondary to this, was on oxygen but also on further investigating the patient was on oxygen in the past. She mentioned that she did not fill her paperwork and could not get her oxygen. Saturations were in the 80s.  I  have requested case manager to set up home oxygen at home, which has been set up prior to discharge.  3.  For hypertension, anemia of chronic disease, have been stable during the hospital stay.  4.  Mild bronchitis over her COPD for which she will be on Levaquin and prednisone taper.   Prior to discharge, the patient has no wheezing, no crackles, no edema, feels comfortable, no shortness of breath.   TIME SPENT ON DAY OF DISCHARGE  ON DISCHARGE ACTIVITY: 40 minutes.    ____________________________ Leia Alf Etsuko Dierolf, MD srs:AT D: 05/22/2014 18:09:44 ET T: 05/23/2014 03:45:46 ET JOB#: [id number]  cc: Alveta Heimlich R. Jarmel Linhardt, MD, <Dictator> Neita Carp MD ELECTRONICALLY SIGNED 05/29/2014 14:56

## 2014-11-16 NOTE — Discharge Summary (Signed)
PATIENT NAME:  Cynthia Dean, Cynthia Dean MR#:  [id number] DATE OF BIRTH:  1951/03/23  DATE OF ADMISSION:  08/31/2011 DATE OF DISCHARGE:  09/08/2011  ADMITTING DIAGNOSIS:  Acute on chronic respiratory failure.    DISCHARGE DIAGNOSES:   1. Acute on chronic respiratory failure, chronic respiratory failure with 2 liters of oxygen via nasal cannula at home.  2. Congestive heart failure left heart,  acute on chronic, diastolic.   3. Chronic obstructive pulmonary disease exacerbation.  4. Acute bronchitis.   5. Sinusitis.  6. Malignant hypertension.  7. Obstructive sleep apnea.  8. Acute on chronic renal failure.  9. Chronic kidney disease stage IV.  10. Hyperlipidemia.  11. Hyperkalemia.  12. Hyponatremia.  13. Obesity.  14. Anemia, status post 2 units of packed red blood cell transfusion.  15. Lower extremity swelling. No deep vein thrombosis on Doppler ultrasound.  16. Myoclonus.   17. Anxiety, resolved.  18. Diabetes mellitus.  19. Obstructive sleep apnea.  20. A 20 pack-year tobacco abuse.  21. Diabetes mellitus, insulin-dependent.   22. Chronic back pain.   23. Diabetic retinopathy.  24. Obesity as mentioned above.  25. History of hemorrhoids.   26. Iron deficiency anemia.  27. Anemia of chronic disease.   28. Secondary hyperparathyroidism.   29. Vitamin D deficiency.   30. History of upper gastrointestinal bleed in the past.   DISCHARGE CONDITION: Stable.   DISCHARGE MEDICATIONS: The patient is to resume her outpatient medications which are:  1. Iron sulfate 325 mg p.o. twice daily.   2. Prilosec 20 mg p.o. twice daily.   3. Norvasc 10 mg p.o. daily.  4. Toprol-XL 100 mg p.o. twice daily.   5. Aspirin 81 mg p.o. daily.   6. Gabapentin 300 mg 3 tablets 3 times daily as well as 2 more pills whenever she needs to.  7. Vitamin D2 at 50,000 units orally twice weekly.  8. NovoLog mix 70/30 subcutaneously twice daily.  9. Advair Diskus 250/50 one puff twice daily.  10. Tiotropium 1  inhaler once daily.   11. Zocor 20 mg p.o. at bedtime.  12. Robitussin 10 mL every six hours as needed.  13. Ambien 5 mg p.o. at bedtime.  14. Ambien 5 mg 2 at bedtime as needed.  15. Colace 100 mg p.o. twice daily.  16. Senokot 1 tablet twice daily.  17. DuoNeb 0.5-2.5 mg in 3 mL inhalation solution 4 times daily as needed.   ADDITIONAL MEDICATIONS:  1. Hydralazine 100 mg p.o. 3 times daily.   2. Prednisone 60 mg on the September 08, 2011, then taper by 10 mg until stopped.  3. Levaquin 250 mg p.o. daily for 7 more days.  4. Nitroglycerin patch topically 0.6 mg daily.  5. Magnesium oxide 400 mg p.o. daily.  6. Sliding-scale insulin.  7. Tylenol 650 mg p.o. every four hours as needed.  8. Ipratropium nasal spray 2 sprays to both nostrils twice daily.  9. Robitussin-AC 10 mL every four hours as needed.  10. Diazepam 5 mg every four hours as needed.  11. Lasix 40 mg p.o. daily dose. This is a new dose.    Home oxygen: Portable tank at 2 liters of oxygen through nasal cannula.   DIET: A 2 grams salt, 1800 ADA, low fat, low cholesterol, kidney diet.   ACTIVITIES: Activity limitations as tolerated.   REFERRAL: Home health physical therapy R.N. as well as aide  FOLLOWUP: Followup appointment with Dr. Marilynn Rail on Tuesday, September 13, 2011, at 2:30 p.m. in  their office. Dr. Holley Raring on the September 21, 2011.     CONSULTANTS: Care management, Dr Holley Raring, Dr. Murlean Iba, Dr Bartholome Bill.    RADIOLOGIC STUDIES: Chest PA and lateral August 31, 2011 showed changes consistent with congestive heart failure. Repeat chest x-ray PA and lateral on September 01, 2011 showed observed improvement and previous changes of congestive heart failure. No new pulmonary infiltrates are seen. Stable cardiomegaly. Repeat chest x-ray PA and lateral September 04, 2011, findings of persistent congestive heart failure and mild pulmonary interstitial edema.  Ultrasound of lower extremities bilaterally September 03, 2011,  no evidence of thrombus within the right and left femoral or popliteal veins. Echocardiogram September 01, 2011, left ventricular systolic function is normal. Ejection fraction of 50%. There is mild mitral regurgitation. There is mild tricuspid regurgitation.   HOSPITAL COURSE:   The patient is a 64 year old African American female with past medical history significant for history of CKD who presented to the hospital with complaints of shortness of breath. Please refer to Dr. Trena Platt admission note on August 31, 2011. On arrival to the emergency room, the patient's vitals showed a temperature of 98, pulse was 71, respiration rate was 20, blood pressure 139/55, saturation was 90% on room air, 98% on 2 liters of oxygen through nasal cannula. Physical exam revealed decreased breath sounds bilaterally at bases, otherwise no significant changes. Patient was also noted to have 1+ pedal edema. EKG showed normal sinus rhythm with no acute ST-T changes. Chest x-ray was congestive heart failure. Patient's nonfasting BMP showed glucose of 107.  Beta type natriuretic peptide was elevated at 2491.  BUN and creatinine were 55 and 2.68. Otherwise BMP was unremarkable. The patient's liver enzymes were normal. Cardiac enzymes, first set, was negative as well as subsequent 2 more sets.  Patient's CBC showed a white blood cell count 11.5, hemoglobin was 7.9, and platelet count 196,000.  TSH was normal at 1.23.  ABGs were done on 28% FiO2, showed pH of 7.38, pCO2 38, pO2 of 68, saturation was 93% via nasal cannula.   The patient was admitted to the hospital because of concerns of congestive heart failure. She was diuresed. She was given IV medications. With diuresis her kidney worsened to the level of 3.44 on September 03, 2011 and even higher to 3.51 later in the day of September 03, 2011.  Even though the patient's Lasix was stopped, the patient's kidney function continued to deteriorate.  On September 04, 2011, patient's creatinine  was 3.62; and this was the highest level that she had.  The patient's Lasix was stopped and despite improvement of her chest x-ray patient continues to have shortness of breath. It was felt that the patient's shortness of breath could have been related to acute bronchitis, chronic obstructive pulmonary disease exacerbation, so she was initiated on antibiotic therapy as well as steroid therapy and inhalation therapy. With this therapy she slowly improved, and showed progressive improvement of her kidney function since the patient's Lasix dose was also somewhat decreased.  On September 08, 2011, the patient's BUN and creatinine were 75 and 3.48. It was felt that the patient would benefit from given lower doses of Lasix, and she is to follow up with Dr. Holley Raring to ensure that the patient's kidney function continues to improve.  The patient is to continue therapy for chronic obstructive pulmonary disease exacerbation and acute bronchitis. Sputum cultures were ordered but never received. However, because of patient's clinical improvement, it was felt that she is to continue  antibiotic therapy for 7 more days as well as prednisone taper and inhalation therapy to achieve improvement.  The patient was also noted to have markedly elevated blood pressure which was very poorly responsive to her blood pressure medications. The patient's blood pressure medications were advanced numerous times and her hydralazine was increased to 100 mg p.o. 3 times daily dose. Nitroglycerin patch was also added.  With this therapy her blood pressure somewhat improved, however not significantly so. On the day of discharge, September 08, 2011, patient's vitals:  Temperature is 98.4, pulse 79, respiration rate 20, blood pressure 150/70, saturation 97% on 2 liters of oxygen through nasal cannula.  It is recommended to advance the patient's blood pressure medications as needed to ensure improvement.   In regards to hyperkalemia, which was noted with  worsening kidney function, patient was given conservative medical therapy for hyperkalemia.  Potassium level was noted to be high at 6.1 on September 03, 2011, and it improved by the day of discharge.  On February 14, it was 4.3.  It is recommended to follow the patient's potassium levels very closely and make decisions about therapy as needed.   The patient was noted to be severely anemic on arrival to the hospital. It was felt that the patient does have anemia of chronic disease as well as  iron deficiency anemia. She was on iron supplements. No iron studies were performed while she was in the hospital. The patient was transfused with 2 units of packed red blood cells total, after which patient's hemoglobin level improved. On September 08, 2011, patient's hemoglobin level is 8.4. It is recommended to follow the patient's hemoglobin levels very closely as the patient's hemoglobin level did not increase significantly after transfusion. She is to continue iron supplements and follow up with a hemoglobin level as outpatient.  No bleeding was noted while she was in the hospital.   The patient was noted to have mild lower extremity swelling. Doppler ultrasound was performed and failed to show deep vein thrombosis.    The patient had days wen she would have myoclonus which was voluntary suppressible. It was felt to be related probably due to medications as well as possibly some element of anxiety.  She was receiving steroids as well as inhalation therapy with beta mimetics When patient was started her Valium, her  myoclonus slowly resolved.   Diabetes mellitus Patient's blood glucose levels were followed while the patient was in the hospital and supplemented with insulin sliding scale. The patient's glucose levels were high due to steroid use now; however, as the patient's steroids are being tapered she is to resume her usual outpatient medications and continue sliding scale insulin therapy as needed.  She is to  follow up with her primary care physician for further recommendations.   For obstructive sleep apnea, as patient was continued having problems with sinus inflammation, sinus infection.  No CPAP was ordered for her during her stay in the hospital time. However, patient is to continue CPAP at home. Did not show abnormal oxygenation.    For diabetic neuropathy, patient is to continue gabapentin.    For vitamin D deficiency, patient is to continue vitamin D.  Hyperlipidemia. The patient is to continue Zocor as well as low fat, low cholesterol diet.    Patient controlled having problems with constipation, and the patient's medications were advanced.   Because of sinus infection, ipratropium nasal spray was ordered for her.  The patient is being discharged in stable condition with the above-mentioned medications and followup.   TIME SPENT:  40 minutes.     ____________________________ Theodoro Grist, MD rv:vtd D: 09/08/2011 21:38:21 ET T: 09/10/2011 12:34:29 ET JOB#: [id number]  cc: Theodoro Grist, MD, <Dictator> Dr Johna Roles MD ELECTRONICALLY SIGNED 09/19/2011 17:20

## 2014-11-16 NOTE — Consult Note (Signed)
General Aspect patient is a 64 year old African American female history of diabetes mellitus, hypertension, renal insufficiency who presents complaints of increasing shortness of breath.  She was noted to be significantly anemic with a hemoglobin of 7.9.  Her renal function is also worsened from her baseline.  She is tolerating [facility] nephrology.  She has no known cardiac problems.  Previous admissions have resulted in the diagnosis of diastolic dysfunction with preserved left ventricular function.  She complains of increasing shortness of breath and fatigue.  She complains worsening peripheral edema.  She does not q. daily weights at home but feels her weight is increased.  She complains of shortness of breath with activity.   Physical Exam:   GEN obese    HEENT PERRL    NECK No masses    RESP wheezing  rhonchi  crackles    CARD Regular rate and rhythm  Murmur    Murmur Systolic    Systolic Murmur axilla    ABD denies tenderness  normal BS  no Adominal Mass    LYMPH negative neck    EXTR positive edema    SKIN normal to palpation    NEURO cranial nerves intact, motor/sensory function intact    PSYCH A+O to time, place, person   Review of Systems:   Subjective/Chief Complaint shortness of breath and fatigue    General: Fatigue    Skin: No Complaints    ENT: No Complaints    Eyes: No Complaints    Respiratory: Frequent cough  Short of breath  Wheezing    Cardiovascular: No Complaints    Gastrointestinal: No Complaints    Genitourinary: No Complaints    Vascular: No Complaints    Musculoskeletal: No Complaints    Neurologic: No Complaints    Hematologic: No Complaints    Endocrine: No Complaints    Psychiatric: No Complaints    Review of Systems: All other systems were reviewed and found to be negative    Medications/Allergies Reviewed Medications/Allergies reviewed     GERD - Esophageal Reflux:    Kidney Failure:    CHF:    COPD:     Hypertension:    Diabetes:    breast biopsy:   Home Medications: Medication Instructions Status  ferrous sulfate 325mg     2 times a day  Active  prilosec  20 mg    2 times a day  Active  Norvasc 10 mg oral tablet   once a day  Active  Toprol-XL 100 mg oral tablet,    2 times a day  Active  aspirin 81 mg oral enteric coated tablet 1  orally once a day  Active  gabapentin 300 mg oral capsule 3 orally 3 times a day plus possibly 2 more pills Active  Vitamin D2 50,000 intl units oral capsule 1 cap(s) orally 2 times a week Active  NovoLog Mix 70/30 subcutaneous suspension  subcutaneous 2 times a day Active  Advair Diskus 250 mcg-50 mcg inhalation powder 1 puff(s) inhaled 2 times a day Active  tiotropium 18 mcg inhalation capsule 1 each inhaled once a day Active  Zocor 20 mg oral tablet 1 tab(s) orally once a day (at bedtime) Active  Robitussin 10 milliliter(s) orally , As Needed q6hrs Active  Ambien 5 mg oral tablet 1 tab(s) orally once a day (at bedtime) Active  furosemide 40 mg oral tablet 1 tab(s) orally 2 times a day Active  Imdur 60 mg oral tablet, extended release 1 tab(s) orally  once a day (in the morning) Active  hydrALAZINE 10 mg oral tablet 1 tab(s) orally 4 times a day Active  Colace 100 mg oral capsule 1 cap(s) orally 2 times a day Active  Senokot 1 tab(s) orally 2 times a day Active  DuoNeb 0.5 mg-2.5 mg/3 mL inhalation solution 3 milliliter(s) inhaled 4 times a day, As Needed Active   EKG:   EKG NSR    Hydrocodone: N/V  Percocet: N/V    Impression 64 year old female chronic kidney disease, hypertension, diabetes as well as a history of diastolic dysfunction.  She presents with increasing shortness of breath.  Chest x-ray reveals luminary edema.  She denies significant chest pain and reports compliance with her medications.  She does not q. daily weights at home but feels like her weight is increased.  She attempts to eat a low-sodium diet.    Plan 1.  Rule out for  myocardial infarction 2.  Careful diuresis follow renal function 3.  Echocardiogram to followup for any interval change in the left ventricular function were valves 4.  Weight loss 5.  Consider evaluation for evidence of sleep apnea 6.  Further recommendations based on the patient's course   Electronic Signatures: Teodoro Spray (MD)  (Signed 06-Feb-13 21:26)  Authored: General Aspect/Present Illness, History and Physical Exam, Review of System, Past Medical History, Home Medications, EKG , Allergies, Impression/Plan   Last Updated: 06-Feb-13 21:26 by Teodoro Spray (MD)

## 2014-11-16 NOTE — H&P (Signed)
PATIENT NAME:  Cynthia Dean, Cynthia Dean MR#:  [id number] DATE OF BIRTH:  [birth year]  DATE OF ADMISSION:  08/31/2011  PRIMARY CARE PHYSICIAN: Mammie Lorenzo, MD [facility])  NEPHROLOGIST: Dr. Evangeline Gula [facility]  REQUESTING PHYSICIAN: Beaulah Dinning, MD  CHIEF COMPLAINT: Shortness of breath.   HISTORY OF PRESENT ILLNESS: The patient is a 64 year old female with a known history of diastolic heart failure, chronic obstructive pulmonary disease, and chronic kidney disease stage IV with baseline creatinine around 3.0 who is being admitted for acute on chronic respiratory failure likely due to congestive heart failure exacerbation. The patient was recently seen by her primary care physician a couple of weeks ago when she was requesting an increase in Lasix from 60 mg twice a day to 80 mg twice a day, for which she took for about a week. She was dropped down to 60 mg twice a day for the last couple of weeks and started getting more short of breath over the weekend. She put her oxygen back on, which she was required to use only as needed, but has been requiring it since the weekend. She has been using 2 liters oxygen. She was feeling very tired and decided to come to the Emergency Department as she could not function at home. While in the ED, she is still having some trouble breathing and she is being admitted for further evaluation and management.   PAST MEDICAL HISTORY:  1. Insulin-dependent diabetes mellitus. 2. Chronic kidney disease stage IV with baseline creatinine anywhere from 2.6 to 3.0.  3. Hypertension.  4. Chronic back pain.  5. Diabetic retinopathy.  6. Obesity.  7. Diastolic congestive heart failure. 8. Hemorrhoids.  9. Anemia of chronic kidney disease. 10. Iron deficiency anemia.  11. Secondary hyperparathyroidism. 12. Vitamin D deficiency.  13. History of upper gastrointestinal bleed.   PAST SURGICAL HISTORY:  1. Right breast biopsy (benign).  2. Colonoscopy  and endoscopy.   ALLERGIES: She is intolerant to Percocet and hydrocodone makes her nauseous and sick to her stomach.  SOCIAL HISTORY: She lives at home with her boyfriend. She quit smoking 2 to 3 years ago. No alcohol or drug use.   FAMILY HISTORY: Mother died of stroke. She was also diabetic. Father is healthy. She has a brother with diabetes and sister with heart disease.   HOME MEDICATIONS: (From last discharge summary that she could confirm are as follows)  1. Iron sulfate 325 mg p.o. twice a day. 2. Prilosec 20 mg p.o. twice a day.  3. Norvasc 10 mg p.o. daily.  4. Aspirin 81 mg p.o. daily.  5. Gabapentin 900 mg p.o. three times daily and 2 pills additionally if needed.  6. Vitamin D 50,000 international units 1 capsule twice a day. 7. Magnesium oxide 400 mg p.o. daily. 8. Sliding scale insulin.  9. Toprol-XL 100 mg p.o. daily.  10. Advair 250/50 1 puff twice a day.  11. Spiriva once daily. 12. Zocor 20 mg p.o. at bedtime. 13. Ambien 5 mg p.o. at bedtime as needed.  14. Lasix 60 mg p.o. twice a day. 15. Imdur 60 mg p.o. daily.  16. NovoLog mix 70/30,  45 units in the morning and 35 units subcutaneous in the evening.  17. Hydralazine 10 mg p.o. four times daily. 18. Colace 100 mg p.o. twice a day.  REVIEW OF SYSTEMS: CONSTITUTIONAL: No fever. Positive fatigue and weakness. Also some weight gain for the last few days. EYES: No blurred or double vision. She does have  known history of diabetic retinopathy. ENT: No tinnitus or ear pain. RESPIRATORY: No cough, wheezing, or hemoptysis. Positive for shortness of breath. Also history of chronic obstructive pulmonary disease. CARDIOVASCULAR: No chest pain. There is some epigastric pain. No orthopnea. Positive for edema and dyspnea on exertion. GI: History of gastritis and esophagitis diagnosed by endoscopy in May 2010. She is having some epigastric pain. No rectal bleeding. GU: No dysuria or hematuria. ENDOCRINE: No polyuria or nocturia.  HEMATOLOGY: Positive for anemia. No bleeding. SKIN: No rash or lesion. MUSCULOSKELETAL: No arthritis. Positive for chronic back pain. NEUROLOGIC: No tingling, numbness, or weakness. PSYCHIATRIC: No history of anxiety or depression.   PHYSICAL EXAMINATION:   VITAL SIGNS: Temperature 98, heart rate 71 per minute, respirations 20 per minute, blood pressure 139/55 mmHg, and she is saturating 90% on room air and 98% on 2 liters oxygen via nasal cannula.  GENERAL:  The patient is a 64 year old morbidly obese female lying in bed in some respiratory distress.   EYES: Pupils are equal, round, and reactive to light and accommodation. No scleral icterus. Extraocular movements are intact.   HENT: Head atraumatic, normocephalic. Oropharynx and nasopharynx clear.   NECK: No jugular venous distention. No thyroid enlargement or tenderness.   LUNGS: Decreased breath sounds in the bases bilaterally. No wheezing, rales, rhonchi, or crepitation.   HEART: S1 and S2 normal. No murmur, rubs, or gallops.   ABDOMEN: Soft, obese, nontender, and nondistended. Bowel sounds are present. No organomegaly or masses.   EXTREMITIES: 1+ pedal edema. No cyanosis or clubbing.  SKIN: No obvious rash, lesion, or ulcer.  PSYCH: The patient is oriented to time, place, and person x3.   NEUROLOGIC: Cranial nerves II through XII intact. Muscle strength 5/5. Extremity sensation intact.   PSYCH: The patient is oriented to time, place, and person x3.   LABS/STUDIES: Normal BMP except BUN of 55 and creatinine 2.68. BNP 2491. Blood glucose 157. Normal liver function tests. Normal first set of cardiac enzymes. CBC showed white count of 11.5, hemoglobin 7.9, hematocrit 24.8, and platelets 296.   ABG showed normal labs, except PO2 of 68.   Chest x-ray while in the Emergency Department showed findings consistent with congestive heart failure.   EKG shows normal sinus rhythm, no major ST-T wave changes.  IMPRESSION AND PLAN:   1. Acute on chronic respiratory failure likely due to congestive heart failure exacerbation. Her last echocardiogram in September 2012 showed normal LV function, possible diastolic dysfunction. We will obtain serial cardiac enzymes, obtain daily weights and strict ins and outs, and pulse oximetry as needed. Cannot start ACE inhibitor due to underlying renal failure. She did have issues with hyperkalemia also. We will start her on aggressive IV diuresis with Lasix. Continue beta blocker. Consult cardiology. Hold off on echocardiogram unless cardiology wants to repeat. She is also on Imdur, which we will continue. 2. Acute on Chronic CHF: diastolic based on last echo, repeat echo, cardio c/s. lasix, b-blokcer, imdur for now. 3. Anemia of chronic disease, likely due to underlying CKD, also iron deficiency. She also has a known history of gastritis and esophagitis based on endoscopy. I would hold off repeating this at this time. We will provide 1 unit of packed red blood cell transfusion due to her underlying congestive heart failure as it may be making it difficult with poor oxygenation carrying capacity of hemoglobin. She has been explained the risks and benefits of blood transfusion and she has agreed and consented for giving blood transfusion.  4.  Chronic kidney disease, stage IV. Creatinine is at baseline with a value of 2.6. We will monitor.  5. Diabetes. We will continue NovoLog 70/30 at this time and start him on sliding scale insulin.  6. Iron deficiency anemia. We will continue her iron tablets.  7. History of chronic obstructive pulmonary disease. We will continue her home medication. She is not having any chronic obstructive pulmonary disease flare at this time.    CODE STATUS: FULL CODE.   TOTAL TIME SPENT: 55 minutes.  ____________________________ Lucina Mellow. Manuella Ghazi, MD vss:slb D: 08/31/2011 15:04:39 ET T: 08/31/2011 15:34:03 ET JOB#: [id number]  cc: Kaizer Dissinger S. Manuella Ghazi, MD, <Dictator> Mammie Lorenzo, MD [facility]) [facility] Corey Skains, MD Lucina Mellow [facility] MD ELECTRONICALLY SIGNED 09/01/2011 15:00

## 2015-02-02 ENCOUNTER — Encounter
Admission: RE | Disposition: A | Discharge status: Home / Self Care | Payer: Self-pay | Source: Ambulatory Visit | Attending: Vascular Surgery

## 2015-02-02 ENCOUNTER — Ambulatory Visit
Admission: RE | Admit: 2015-02-02 | Discharge: 2015-02-02 | Disposition: A | Discharge status: Home / Self Care | Payer: Medicare HMO | Source: Ambulatory Visit | Attending: Vascular Surgery | Admitting: Vascular Surgery

## 2015-02-02 ENCOUNTER — Encounter: Payer: Self-pay | Admitting: *Deleted

## 2015-02-02 DIAGNOSIS — Z87891 Personal history of nicotine dependence: Secondary | ICD-10-CM | POA: Insufficient documentation

## 2015-02-02 DIAGNOSIS — E1122 Type 2 diabetes mellitus with diabetic chronic kidney disease: Secondary | ICD-10-CM | POA: Diagnosis not present

## 2015-02-02 DIAGNOSIS — X58XXXA Exposure to other specified factors, initial encounter: Secondary | ICD-10-CM | POA: Insufficient documentation

## 2015-02-02 DIAGNOSIS — Z992 Dependence on renal dialysis: Secondary | ICD-10-CM | POA: Insufficient documentation

## 2015-02-02 DIAGNOSIS — I2510 Atherosclerotic heart disease of native coronary artery without angina pectoris: Secondary | ICD-10-CM | POA: Diagnosis not present

## 2015-02-02 DIAGNOSIS — T82858A Stenosis of vascular prosthetic devices, implants and grafts, initial encounter: Secondary | ICD-10-CM | POA: Diagnosis not present

## 2015-02-02 DIAGNOSIS — N186 End stage renal disease: Secondary | ICD-10-CM | POA: Diagnosis not present

## 2015-02-02 HISTORY — PX: PERIPHERAL VASCULAR CATHETERIZATION: SHX172C

## 2015-02-02 HISTORY — DX: Atherosclerotic heart disease of native coronary artery without angina pectoris: I25.10

## 2015-02-02 HISTORY — DX: Type 2 diabetes mellitus without complications: E11.9

## 2015-02-02 LAB — POTASSIUM (ARMC VASCULAR LAB ONLY): Potassium (ARMC vascular lab): 5.1

## 2015-02-02 SURGERY — A/V SHUNTOGRAM/FISTULAGRAM
Anesthesia: Moderate Sedation | Laterality: Left

## 2015-02-02 MED ORDER — LIDOCAINE-EPINEPHRINE (PF) 1 %-1:200000 IJ SOLN
INTRAMUSCULAR | Status: AC
  Filled 2015-02-02: qty 30

## 2015-02-02 MED ORDER — CEFAZOLIN SODIUM 1-5 GM-% IV SOLN
INTRAVENOUS | Status: AC
  Filled 2015-02-02: qty 50

## 2015-02-02 MED ORDER — SODIUM CHLORIDE 0.9 % IJ SOLN
INTRAMUSCULAR | Status: AC
  Filled 2015-02-02: qty 15

## 2015-02-02 MED ORDER — SODIUM CHLORIDE 0.9 % IV SOLN
INTRAVENOUS | Status: DC
  Administered 2015-02-02: 12:00:00 via INTRAVENOUS

## 2015-02-02 MED ORDER — IOHEXOL 300 MG/ML  SOLN
INTRAMUSCULAR | Status: DC | PRN
  Administered 2015-02-02: 20 mL via INTRAVENOUS

## 2015-02-02 MED ORDER — MIDAZOLAM HCL 2 MG/2ML IJ SOLN
INTRAMUSCULAR | Status: DC | PRN
  Administered 2015-02-02: 1 mg via INTRAVENOUS
  Administered 2015-02-02: 2 mg via INTRAVENOUS

## 2015-02-02 MED ORDER — DEXTROSE 50 % IV SOLN: 0.5000 | Freq: Once | INTRAVENOUS | Status: DC | PRN

## 2015-02-02 MED ORDER — FENTANYL CITRATE (PF) 100 MCG/2ML IJ SOLN
INTRAMUSCULAR | Status: AC
  Filled 2015-02-02: qty 2

## 2015-02-02 MED ORDER — LIDOCAINE-EPINEPHRINE (PF) 1 %-1:200000 IJ SOLN
INTRAMUSCULAR | Status: DC | PRN
  Administered 2015-02-02: 10 mL via INTRADERMAL

## 2015-02-02 MED ORDER — HEPARIN (PORCINE) IN NACL 2-0.9 UNIT/ML-% IJ SOLN
INTRAMUSCULAR | Status: AC
  Filled 2015-02-02: qty 1000

## 2015-02-02 MED ORDER — FENTANYL CITRATE (PF) 100 MCG/2ML IJ SOLN
INTRAMUSCULAR | Status: DC | PRN
  Administered 2015-02-02 (×2): 50 ug via INTRAVENOUS

## 2015-02-02 MED ORDER — MIDAZOLAM HCL 5 MG/5ML IJ SOLN
INTRAMUSCULAR | Status: AC
  Filled 2015-02-02: qty 5

## 2015-02-02 MED ORDER — CEFAZOLIN SODIUM 1-5 GM-% IV SOLN
1.0000 g | Freq: Once | INTRAVENOUS | Status: AC
  Administered 2015-02-02: 1 g via INTRAVENOUS

## 2015-02-02 MED ORDER — ONDANSETRON HCL 4 MG/2ML IJ SOLN: 4.0000 mg | INTRAMUSCULAR | Status: DC | PRN

## 2015-02-02 MED ORDER — HEPARIN SODIUM (PORCINE) 1000 UNIT/ML IJ SOLN
INTRAMUSCULAR | Status: DC | PRN
  Administered 2015-02-02: 4000 [IU] via INTRAVENOUS

## 2015-02-02 MED ORDER — ATROPINE SULFATE 0.1 MG/ML IJ SOLN: 0.5000 mg | Freq: Once | INTRAMUSCULAR | Status: DC | PRN

## 2015-02-02 MED ORDER — HYDROMORPHONE HCL 1 MG/ML IJ SOLN: 1.0000 mg | INTRAMUSCULAR | Status: DC | PRN

## 2015-02-02 MED ORDER — HEPARIN SODIUM (PORCINE) 1000 UNIT/ML IJ SOLN
INTRAMUSCULAR | Status: AC
  Filled 2015-02-02: qty 1

## 2015-02-02 SURGICAL SUPPLY — 13 items
BALLN DORADO 8X60X80 (BALLOONS) ×2
BALLN LUTONIX DCB 7X60X130 (BALLOONS) ×4
BALLOON DORADO 8X60X80 (BALLOONS) ×1 IMPLANT
BALLOON LUTONIX DCB 7X60X130 (BALLOONS) ×2 IMPLANT
CANNULA NASAL 8 HUDSON (TUBING) ×2 IMPLANT
DEVICE INFLATION 20/30 (MISCELLANEOUS) ×2 IMPLANT
DRAPE BRACHIAL (DRAPES) ×2 IMPLANT
KIT 5FR STIFF NT/TG (MISCELLANEOUS) ×2 IMPLANT
PACK ANGIOGRAPHY (CUSTOM PROCEDURE TRAY) ×2 IMPLANT
SHEATH BRITE TIP 6FRX5.5 (SHEATH) ×2 IMPLANT
SUT MON AB 4-0 PC3 18 (SUTURE) ×2 IMPLANT
TOWEL OR 17X26 4PK STRL BLUE (TOWEL DISPOSABLE) ×2 IMPLANT
WIRE MAGIC TOR.035 180C (WIRE) ×2 IMPLANT

## 2015-02-02 NOTE — H&P (Signed)
[facility] History & Physical Update  The patient was interviewed and re-examined.  The patient's previous History and Physical has been reviewed and is unchanged.  There is no change in the plan of care. We plan to proceed with the scheduled procedure.  DEW,JASON, MD  02/02/2015, 11:04 AM

## 2015-02-02 NOTE — CV Procedure (Signed)
[facility]    OPERATIVE NOTE   PROCEDURE: 1.  Left brachial artery to axillary vein arteriovenous graft cannulation under ultrasound guidance 2.  Left arm shuntogram 3.  PTA of venous anastomosis with 7 mm diameter drug coated balloon and 8 mm diameter high pressure angioplasty balloon 4.  PTA of mid graft with 7 mm diameter drug coated balloon and 8 mm diameter high pressure angioplasty balloon  PRE-OPERATIVE DIAGNOSIS: 1. ESRD 2. Malfunctioning left arm arteriovenous graft  POST-OPERATIVE DIAGNOSIS: same as above   SURGEON: Leotis Pain, MD  ANESTHESIA: local with MCS  ESTIMATED BLOOD LOSS: 25 cc  FINDING(S): 1. Moderate stenosis in stent mid graft, moderate to high grade stenosis at venous anastomosis  SPECIMEN(S):  None  CONTRAST: 40 cc  INDICATIONS: Cynthia Dean is a 64 y.o. female who presents with malfunctioning left brachial artery to axillary vein arteriovenous graft.  The patient is scheduled for left arm shuntogram.  The patient is aware the risks include but are not limited to: bleeding, infection, thrombosis of the cannulated access, and possible anaphylactic reaction to the contrast.  The patient is aware of the risks of the procedure and elects to proceed forward.  DESCRIPTION: After full informed written consent was obtained, the patient was brought back to the angiography suite and placed supine upon the angiography table.  The patient was connected to monitoring equipment.  The  Left arm was prepped and draped in the standard fashion for a percutaneous access intervention.  Under ultrasound guidance, the left brachial artery to axillary vein arteriovenous graft was cannulated with a micropuncture needle under direct ultrasound guidance  Near the arterial anastomosis in an antegrade fashion and a permanent image was performed.  The microwire was advanced into the graft and the needle was exchanged for the a microsheath.  I then  upsized to a 6 Fr Sheath and imaging was performed.  Hand injections were completed to image the access including the central venous system. This demonstrated moderate intimal hyperplasia stenosis in the 60% range in the mid portion of the graft as well as more significant stenosis in the 80-85% range at the venous anastomosis.  Based on the images, this patient will need treatment of these areas to improve the function of the graft. I then gave the patient 4000 units of intravenous heparin.  I then crossed both stenoses with a Magic Tourqe wire.  Based on the imaging, a 7 mm x 6 cm  Drug coated angioplasty balloon was selected for the venous anastomosis and a second 7 mm x 6 cm drug coated balloon for the separate and distinct mid graft stenosis .  The balloon was centered around the stenoses and inflated to 12 ATM for 1 minute(s) in both locations separately.  On completion imaging, a >50% residual stenosis was present in both locations.  The lesions were then treated with an 8 mm diameter high pressure balloon first at the venous anastomosis and then separately in the mid graft.  At the venous anastomosis 16-18 atm were required to break the waist.  In the mid graft, the inflation was to 12 atm.  Completion angiogram following this showed about 20% residual stenosis in the mid graft and <10% residual stenosis at the venous anastomosis.    Based on the completion imaging, no further intervention is necessary.  The wire and balloon were removed from the sheath.  A 4-0 Monocryl purse-string suture was sewn around the sheath.  The sheath was removed while  tying down the suture.  A sterile bandage was applied to the puncture site.  COMPLICATIONS: None  CONDITION: Stable   DEW,JASON  02/02/2015 1:42 PM

## 2015-02-02 NOTE — H&P (Signed)
[facility] Admission History & Physical  MRN : [id number]  Cynthia Dean is a 64 y.o. (09-10-1950) female who presents with chief complaint of No chief complaint on file. [name]  History of Present Illness: Patient is referred from her outpatient dialysis center for evaluation for a poorly functioning dialysis access. Apparently at her center, the flow rates are down and her access is not functioning properly. She feels well in her usual state of health and does not have specific complaints today. She has had previous interventions for diminished flow in her access.  Current Facility-Administered Medications  Medication Dose Route Frequency Provider Last Rate Last Dose  . 0.9 %  sodium chloride infusion   Intravenous Continuous Algernon Huxley, MD 20 mL/hr at 02/02/15 1137    . atropine 0.1 MG/ML injection 0.5 mg  0.5 mg Intravenous Once PRN Algernon Huxley, MD      . ceFAZolin (ANCEF) IVPB 1 g/50 mL premix  1 g Intravenous Once Algernon Huxley, MD      . dextrose 50 % solution 25 mL  0.5 ampule Intravenous Once PRN Algernon Huxley, MD      . HYDROmorphone (DILAUDID) injection 1 mg  1 mg Intravenous Q30 min PRN Algernon Huxley, MD      . ondansetron [facility]) injection 4 mg  4 mg Intravenous Q30 min PRN Algernon Huxley, MD        Past Medical History  Diagnosis Date  . Chronic kidney disease   . Coronary artery disease   . Diabetes mellitus without complication     History reviewed. No pertinent past surgical history.  Social History History  Substance Use Topics  . Smoking status: Former Research scientist (life sciences)  . Smokeless tobacco: Not on file  . Alcohol Use: No   lives at home.  Family History No history of bleeding or clotting disorders to her knowledge. No history of autoimmune diseases. No history of aneurysms.  NKDA  REVIEW OF SYSTEMS (Negative unless checked)  Constitutional: [] Weight loss  [] Fever  [] Chills Cardiac: [] Chest pain   [] Chest pressure   [] Palpitations    [] Shortness of breath when laying flat   [] Shortness of breath at rest   [] Shortness of breath with exertion. Vascular:  [] Pain in legs with walking   [] Pain in legs at rest   [] Pain in legs when laying flat   [] Claudication   [] Pain in feet when walking  [] Pain in feet at rest  [] Pain in feet when laying flat   [] History of DVT   [] Phlebitis   [] Swelling in legs   [] Varicose veins   [] Non-healing ulcers Pulmonary:   [] Uses home oxygen   [] Productive cough   [] Hemoptysis   [] Wheeze  [] COPD   [] Asthma Neurologic:  [] Dizziness  [] Blackouts   [] Seizures   [] History of stroke   [] History of TIA  [] Aphasia   [] Temporary blindness   [] Dysphagia   [] Weakness or numbness in arms   [] Weakness or numbness in legs Musculoskeletal:  [] Arthritis   [] Joint swelling   [] Joint pain   [] Low back pain Hematologic:  [] Easy bruising  [] Easy bleeding   [] Hypercoagulable state   [] Anemic  [] Hepatitis Gastrointestinal:  [] Blood in stool   [] Vomiting blood  [] Gastroesophageal reflux/heartburn   [] Difficulty swallowing. Genitourinary:  [x] Chronic kidney disease   [] Difficult urination  [] Frequent urination  [] Burning with urination   [] Blood in urine Skin:  [] Rashes   [] Ulcers   [] Wounds Psychological:  [] History of anxiety   []   History of major depression.  Physical Examination  Filed Vitals:   02/02/15 1129  BP: 161/77  Pulse: 73  Temp: 98.5 F (36.9 C)  Resp: 18  Height: 5\' 7"  (1.702 m)  Weight: 315 lb 4.1 oz (143 kg)  SpO2: 93%   Body mass index is 49.36 kg/(m^2). Gen: WD/WN, NAD, obese Head: [place]/AT, No temporalis wasting. Prominent temp pulse not noted. Ear/Nose/Throat: Hearing grossly intact, nares w/o erythema or drainage, oropharynx w/o Erythema/Exudate,  Eyes: PERRLA, EOMI.  Neck: Supple, no nuchal rigidity.  No bruit or JVD.  Pulmonary:  Good air movement, clear to auscultation bilaterally, no use of accessory muscles.  Cardiac: RRR, normal S1, S2, no Murmurs, rubs or gallops. Vascular: Positive  thrill and positive bruit in access.                                          Gastrointestinal: soft, non-tender/non-distended. No guarding/reflex.  Musculoskeletal: M/S 5/5 throughout.  Extremities without ischemic changes.  No deformity or atrophy.  Neurologic: CN 2-12 intact. Pain and light touch intact in extremities.  Symmetrical.  Speech is fluent. Motor exam as listed above. Psychiatric: Judgment intact, Mood & affect appropriate for pt's clinical situation. Dermatologic: No rashes or ulcers noted.  No cellulitis or open wounds. Lymph : No Cervical, Axillary, or Inguinal lymphadenopathy.     CBC Lab Results  Component Value Date   WBC 8.1 06/17/2014   HGB 9.5* 06/17/2014   HCT 29.6* 06/17/2014   MCV 92 06/17/2014   PLT 173 06/17/2014    BMET    Component Value Date/Time   NA 135* 06/16/2014 0915   K 4.8 06/16/2014 0915   CL 100 06/16/2014 0915   CO2 22 06/16/2014 0915   GLUCOSE 226* 06/16/2014 0915   BUN 47* 06/16/2014 0915   CREATININE 7.60* 06/16/2014 0915   CALCIUM 7.2* 06/16/2014 0915   GFRNONAA 6* 04/07/2014 0918   GFRAA 7* 04/07/2014 0918   CrCl cannot be calculated (Patient has no serum creatinine result on file.).  COAG Lab Results  Component Value Date   INR 1.0 06/17/2014   INR 1.2 12/15/2013   INR 1.0 09/01/2011      Assessment/Plan 1. ESRD: Has been on dialysis as an outpatient for some time. Access not working well but no complaints of uremia. 2. Complication of dialysis access device: We'll plan fistulogram today with possible intervention to improve function of possible. Risks and benefits were discussed and she is agreeable to proceed. 3. Diabetes: Likely the underlying cause of her renal failure. Outpatient management per her primary care physician.   Jess Toney, MD  02/02/2015 12:23 PM

## 2015-02-05 ENCOUNTER — Encounter: Payer: Self-pay | Admitting: Vascular Surgery

## 2015-02-25 ENCOUNTER — Ambulatory Visit
Admission: RE | Admit: 2015-02-25 | Discharge: 2015-02-25 | Disposition: A | Discharge status: Home / Self Care | Payer: Medicare HMO | Source: Ambulatory Visit | Attending: Pediatrics | Admitting: Pediatrics

## 2015-02-25 ENCOUNTER — Ambulatory Visit: Payer: Medicare HMO

## 2015-02-25 DIAGNOSIS — R922 Inconclusive mammogram: Secondary | ICD-10-CM | POA: Insufficient documentation

## 2015-02-25 DIAGNOSIS — Z1231 Encounter for screening mammogram for malignant neoplasm of breast: Secondary | ICD-10-CM | POA: Diagnosis not present

## 2015-02-25 DIAGNOSIS — Z1239 Encounter for other screening for malignant neoplasm of breast: Secondary | ICD-10-CM

## 2015-02-26 ENCOUNTER — Other Ambulatory Visit: Payer: Self-pay | Admitting: Pediatrics

## 2015-02-26 DIAGNOSIS — N63 Unspecified lump in unspecified breast: Secondary | ICD-10-CM

## 2015-02-26 DIAGNOSIS — R928 Other abnormal and inconclusive findings on diagnostic imaging of breast: Secondary | ICD-10-CM

## 2015-02-26 DIAGNOSIS — R921 Mammographic calcification found on diagnostic imaging of breast: Secondary | ICD-10-CM

## 2015-02-27 ENCOUNTER — Other Ambulatory Visit: Payer: Self-pay | Admitting: Pediatrics

## 2015-02-27 DIAGNOSIS — R921 Mammographic calcification found on diagnostic imaging of breast: Secondary | ICD-10-CM

## 2015-02-27 DIAGNOSIS — N63 Unspecified lump in unspecified breast: Secondary | ICD-10-CM

## 2015-02-27 DIAGNOSIS — R928 Other abnormal and inconclusive findings on diagnostic imaging of breast: Secondary | ICD-10-CM

## 2015-03-04 ENCOUNTER — Ambulatory Visit
Admission: RE | Admit: 2015-03-04 | Discharge: 2015-03-04 | Disposition: A | Discharge status: Home / Self Care | Payer: Medicare HMO | Source: Ambulatory Visit | Attending: Pediatrics | Admitting: Pediatrics

## 2015-03-04 ENCOUNTER — Ambulatory Visit: Payer: Medicare HMO

## 2015-03-04 DIAGNOSIS — R921 Mammographic calcification found on diagnostic imaging of breast: Secondary | ICD-10-CM

## 2015-03-04 DIAGNOSIS — R928 Other abnormal and inconclusive findings on diagnostic imaging of breast: Secondary | ICD-10-CM | POA: Diagnosis present

## 2015-03-04 DIAGNOSIS — N63 Unspecified lump in unspecified breast: Secondary | ICD-10-CM

## 2015-03-04 DIAGNOSIS — N6001 Solitary cyst of right breast: Secondary | ICD-10-CM | POA: Insufficient documentation

## 2015-03-09 ENCOUNTER — Encounter
Admission: RE | Disposition: A | Discharge status: Home / Self Care | Payer: Self-pay | Source: Ambulatory Visit | Attending: Vascular Surgery

## 2015-03-09 ENCOUNTER — Ambulatory Visit
Admission: RE | Admit: 2015-03-09 | Discharge: 2015-03-09 | Disposition: A | Discharge status: Home / Self Care | Payer: Medicare HMO | Source: Ambulatory Visit | Attending: Vascular Surgery | Admitting: Vascular Surgery

## 2015-03-09 ENCOUNTER — Encounter: Payer: Self-pay | Admitting: *Deleted

## 2015-03-09 DIAGNOSIS — N186 End stage renal disease: Secondary | ICD-10-CM | POA: Diagnosis not present

## 2015-03-09 DIAGNOSIS — Y832 Surgical operation with anastomosis, bypass or graft as the cause of abnormal reaction of the patient, or of later complication, without mention of misadventure at the time of the procedure: Secondary | ICD-10-CM | POA: Insufficient documentation

## 2015-03-09 DIAGNOSIS — Z794 Long term (current) use of insulin: Secondary | ICD-10-CM | POA: Diagnosis not present

## 2015-03-09 DIAGNOSIS — E871 Hypo-osmolality and hyponatremia: Secondary | ICD-10-CM | POA: Diagnosis not present

## 2015-03-09 DIAGNOSIS — J449 Chronic obstructive pulmonary disease, unspecified: Secondary | ICD-10-CM | POA: Insufficient documentation

## 2015-03-09 DIAGNOSIS — Z79899 Other long term (current) drug therapy: Secondary | ICD-10-CM | POA: Insufficient documentation

## 2015-03-09 DIAGNOSIS — T82858A Stenosis of vascular prosthetic devices, implants and grafts, initial encounter: Secondary | ICD-10-CM | POA: Diagnosis not present

## 2015-03-09 DIAGNOSIS — Z992 Dependence on renal dialysis: Secondary | ICD-10-CM | POA: Diagnosis not present

## 2015-03-09 DIAGNOSIS — E669 Obesity, unspecified: Secondary | ICD-10-CM | POA: Insufficient documentation

## 2015-03-09 DIAGNOSIS — E119 Type 2 diabetes mellitus without complications: Secondary | ICD-10-CM | POA: Insufficient documentation

## 2015-03-09 DIAGNOSIS — I120 Hypertensive chronic kidney disease with stage 5 chronic kidney disease or end stage renal disease: Secondary | ICD-10-CM | POA: Insufficient documentation

## 2015-03-09 DIAGNOSIS — I2510 Atherosclerotic heart disease of native coronary artery without angina pectoris: Secondary | ICD-10-CM | POA: Insufficient documentation

## 2015-03-09 HISTORY — DX: Chronic obstructive pulmonary disease, unspecified: J44.9

## 2015-03-09 HISTORY — DX: Essential (primary) hypertension: I10

## 2015-03-09 HISTORY — PX: PERIPHERAL VASCULAR CATHETERIZATION: SHX172C

## 2015-03-09 LAB — GLUCOSE, CAPILLARY: Glucose-Capillary: 74 mg/dL (ref 65–99)

## 2015-03-09 LAB — POTASSIUM (ARMC VASCULAR LAB ONLY): POTASSIUM (ARMC VASCULAR LAB): 3.8

## 2015-03-09 SURGERY — A/V SHUNTOGRAM/FISTULAGRAM
Anesthesia: Moderate Sedation

## 2015-03-09 MED ORDER — CEFAZOLIN SODIUM 1-5 GM-% IV SOLN
INTRAVENOUS | Status: AC
  Filled 2015-03-09: qty 50

## 2015-03-09 MED ORDER — HEPARIN SODIUM (PORCINE) 1000 UNIT/ML IJ SOLN
INTRAMUSCULAR | Status: DC | PRN
  Administered 2015-03-09: 3000 [IU] via INTRAVENOUS

## 2015-03-09 MED ORDER — CEFAZOLIN SODIUM 1-5 GM-% IV SOLN
1.0000 g | Freq: Once | INTRAVENOUS | Status: AC
  Administered 2015-03-09: 1 g via INTRAVENOUS

## 2015-03-09 MED ORDER — SODIUM CHLORIDE 0.9 % IV SOLN
INTRAVENOUS | Status: DC
  Administered 2015-03-09: 14:00:00 via INTRAVENOUS

## 2015-03-09 MED ORDER — WHITE PETROLATUM GEL
Status: AC
  Filled 2015-03-09: qty 5

## 2015-03-09 MED ORDER — MIDAZOLAM HCL 2 MG/2ML IJ SOLN
INTRAMUSCULAR | Status: AC
  Filled 2015-03-09: qty 4

## 2015-03-09 MED ORDER — HEPARIN (PORCINE) IN NACL 2-0.9 UNIT/ML-% IJ SOLN
INTRAMUSCULAR | Status: AC
  Filled 2015-03-09: qty 1000

## 2015-03-09 MED ORDER — FENTANYL CITRATE (PF) 100 MCG/2ML IJ SOLN
INTRAMUSCULAR | Status: DC | PRN
  Administered 2015-03-09: 25 ug via INTRAVENOUS
  Administered 2015-03-09: 50 ug via INTRAVENOUS

## 2015-03-09 MED ORDER — MIDAZOLAM HCL 2 MG/2ML IJ SOLN
INTRAMUSCULAR | Status: DC | PRN
Start: 2015-03-09 — End: 2015-03-09
  Administered 2015-03-09: 1 mg via INTRAVENOUS
  Administered 2015-03-09: 2 mg via INTRAVENOUS

## 2015-03-09 MED ORDER — LIDOCAINE-EPINEPHRINE (PF) 1 %-1:200000 IJ SOLN
INTRAMUSCULAR | Status: AC
  Filled 2015-03-09: qty 30

## 2015-03-09 MED ORDER — IOHEXOL 300 MG/ML  SOLN
INTRAMUSCULAR | Status: DC | PRN
  Administered 2015-03-09: 30 mL via INTRAVENOUS

## 2015-03-09 MED ORDER — HEPARIN SODIUM (PORCINE) 1000 UNIT/ML IJ SOLN
INTRAMUSCULAR | Status: AC
  Filled 2015-03-09: qty 1

## 2015-03-09 MED ORDER — FENTANYL CITRATE (PF) 100 MCG/2ML IJ SOLN
INTRAMUSCULAR | Status: AC
  Filled 2015-03-09: qty 2

## 2015-03-09 MED ORDER — DEXTROSE 50 % IV SOLN: 50.0000 mL | Freq: Once | INTRAVENOUS | Status: DC

## 2015-03-09 MED ORDER — DEXTROSE 50 % IV SOLN
INTRAVENOUS | Status: AC
  Administered 2015-03-09: 50 mL
  Filled 2015-03-09: qty 50

## 2015-03-09 SURGICAL SUPPLY — 12 items
BALLN DORADO 6X60X80 (BALLOONS) ×3
BALLN LUTONIX DCB 7X60X130 (BALLOONS) ×3
BALLOON DORADO 6X60X80 (BALLOONS) ×2 IMPLANT
BALLOON LUTONIX DCB 7X60X130 (BALLOONS) ×2 IMPLANT
DEVICE PRESTO INFLATION (MISCELLANEOUS) ×3 IMPLANT
DRAPE BRACHIAL (DRAPES) ×3 IMPLANT
KIT 5FR STIFF NT/TG (MISCELLANEOUS) ×3 IMPLANT
PACK ANGIOGRAPHY (CUSTOM PROCEDURE TRAY) ×3 IMPLANT
SHEATH BRITE TIP 6FRX5.5 (SHEATH) ×3 IMPLANT
SUT MNCRL AB 4-0 PS2 18 (SUTURE) ×3 IMPLANT
TOWEL OR 17X26 4PK STRL BLUE (TOWEL DISPOSABLE) ×3 IMPLANT
WIRE MAGIC TORQUE 260C (WIRE) ×3 IMPLANT

## 2015-03-09 NOTE — CV Procedure (Signed)
[facility]    OPERATIVE NOTE   PROCEDURE: 1.  Left arm brachial artery to axillary vein arteriovenous graft cannulation under ultrasound guidance in both an antegrade and retrograde fashion 2.  Left arm shuntogram 3.  Percutaneous transluminal angioplasty of venous anastomosis and axillary vein with 7 mm diameter drug-coated angioplasty balloon 4.  Percutaneous transluminal angioplasty of arterial access site with 6 mm diameter high pressure angioplasty balloon  PRE-OPERATIVE DIAGNOSIS: 1. ESRD 2. Malfunctioning left brachial artery to axillary vein arteriovenous graft  POST-OPERATIVE DIAGNOSIS: same as above   SURGEON: Leotis Pain, MD  ANESTHESIA: local with MCS  ESTIMATED BLOOD LOSS: 25 cc  FINDING(S): 1. 2 areas of moderate stenosis 1 at the arterial access site and one at the venous anastomosis improved with angioplasty  SPECIMEN(S):  None  CONTRAST: 30 cc  INDICATIONS: Cynthia Dean is a 64 y.o. female who presents with malfunctioning left brachial artery to axillary vein arteriovenous graft.  The patient is scheduled for  left arm shuntogram.  The patient is aware the risks include but are not limited to: bleeding, infection, thrombosis of the cannulated access, and possible anaphylactic reaction to the contrast.  The patient is aware of the risks of the procedure and elects to proceed forward.  DESCRIPTION: After full informed written consent was obtained, the patient was brought back to the angiography suite and placed supine upon the angiography table.  The patient was connected to monitoring equipment.  The  left arm was prepped and draped in the standard fashion for a percutaneous access intervention.  Under ultrasound guidance, the midportion arteriovenous graft was cannulated with a micropuncture needle under direct ultrasound guidance in an antegrade fashion and a permanent image was performed.  The microwire was advanced into the graft  and the needle was exchanged for the a microsheath.  I then upsized to a 6 Fr Sheath and imaging was performed.  Hand injections were completed to image the access including the central venous system. This demonstrated what appeared to be a moderate stenosis at the venous anastomosis and just into the axillary vein of 50-60% in addition, at the arterial access site behind the antegrade sheath was about a 70-75% stenosis within the graft.  Based on the images, this patient will need intervention of these areas. I then gave the patient 3000 units of intravenous heparin.  I then crossed the venous anastomotic stenosis with a Magic Tourqe wire.  Based on the imaging, a 7 mm x 6 cm  Lutonix drug-coated angioplasty balloon was selected.  The balloon was centered around the stenosis and inflated to 12 ATM for 1 minute(s).  On completion imaging, a less than 20 % residual stenosis was present.   To address the arterial access stenosis, a new sheath had to be placed in a retrograde fashion. I removed the antegrade sheath and placed a Monocryl pursestring suture. Pressure was held for several minutes. I then accessed the graft in a retrograde fashion under direct ultrasound guidance without difficulty with a micropuncture needle in the mid to distal portion of the graft. A permanent image was recorded. I upsized to a 6 [place] sheath over a J-wire. I crossed the stenosis at the arterial access site and treated this lesion with a 6 mm diameter by 6 cm length high pressure angioplasty balloon. This was inflated to 16 atm for 1 minute. Completion imaging less than 10% residual stenosis was present.  Based on the completion imaging, no further intervention is necessary.  The wire and balloon were removed from the sheath.  A 4-0 Monocryl purse-string suture was sewn around the sheath.  The sheath was removed while tying down the suture.  A sterile bandage was applied to the puncture site.  COMPLICATIONS: None  CONDITION:  Stable   DEW,JASON  03/09/2015 3:42 PM

## 2015-03-09 NOTE — Discharge Instructions (Signed)
The drugs you were given will stay in your system until tomorrow, so for the next 24 hours you should not.  Drive an automobile. Make any legal decisions.  Drink any alcoholic beverages. ° °Today you should start with liquids and gradually work up to solid foods as your are able to tolerate them ° °Resume your regular medications as prescribed by your doctor.  Avoid aspirin for 24 hours.   ° °Change the Band-Aid or dressing as needed.  After a 2 days no dressing as needed. ° °Avoid strenuous activity for the remainder of the day. ° °Please notify your primary physician immediately if you have any unusual bleeding, trouble breathing, fever >100 degrees or pain not relieved by the medication your doctor prescribed for your doctor prescribed for you physician ° °

## 2015-03-09 NOTE — OR Nursing (Signed)
Report to MD that pt's CBG is 74...the patient had taken fulldose of AM ins: 44 units 70/30. MD ordered amp of Dextrose 50% to be given . Done see mar

## 2015-03-10 ENCOUNTER — Encounter: Payer: Self-pay | Admitting: Vascular Surgery

## 2015-03-10 ENCOUNTER — Other Ambulatory Visit: Payer: Self-pay | Admitting: Pediatrics

## 2015-03-11 ENCOUNTER — Other Ambulatory Visit: Payer: Self-pay | Admitting: Pediatrics

## 2015-03-11 ENCOUNTER — Other Ambulatory Visit: Payer: Self-pay

## 2015-03-11 DIAGNOSIS — R921 Mammographic calcification found on diagnostic imaging of breast: Secondary | ICD-10-CM

## 2015-03-11 DIAGNOSIS — R928 Other abnormal and inconclusive findings on diagnostic imaging of breast: Secondary | ICD-10-CM

## 2015-03-11 DIAGNOSIS — N6001 Solitary cyst of right breast: Secondary | ICD-10-CM

## 2015-03-18 ENCOUNTER — Other Ambulatory Visit: Payer: Self-pay | Admitting: Pediatrics

## 2015-03-18 DIAGNOSIS — R928 Other abnormal and inconclusive findings on diagnostic imaging of breast: Secondary | ICD-10-CM

## 2015-03-18 DIAGNOSIS — R921 Mammographic calcification found on diagnostic imaging of breast: Secondary | ICD-10-CM

## 2015-03-18 DIAGNOSIS — N6001 Solitary cyst of right breast: Secondary | ICD-10-CM

## 2015-03-25 ENCOUNTER — Ambulatory Visit
Admission: RE | Admit: 2015-03-25 | Discharge: 2015-03-25 | Disposition: A | Discharge status: Home / Self Care | Payer: Medicare HMO | Source: Ambulatory Visit | Attending: Pediatrics | Admitting: Pediatrics

## 2015-03-25 DIAGNOSIS — R928 Other abnormal and inconclusive findings on diagnostic imaging of breast: Secondary | ICD-10-CM

## 2015-03-25 DIAGNOSIS — R921 Mammographic calcification found on diagnostic imaging of breast: Secondary | ICD-10-CM

## 2015-03-25 DIAGNOSIS — N6001 Solitary cyst of right breast: Secondary | ICD-10-CM

## 2015-03-26 ENCOUNTER — Other Ambulatory Visit: Payer: Self-pay | Admitting: Pediatrics

## 2015-03-26 DIAGNOSIS — Z1231 Encounter for screening mammogram for malignant neoplasm of breast: Secondary | ICD-10-CM

## 2015-04-01 ENCOUNTER — Ambulatory Visit: Payer: Medicare HMO

## 2015-04-13 ENCOUNTER — Other Ambulatory Visit: Payer: Self-pay | Admitting: Vascular Surgery

## 2015-04-17 ENCOUNTER — Encounter: Payer: Self-pay | Admitting: *Deleted

## 2015-04-17 ENCOUNTER — Encounter
Admission: RE | Disposition: A | Discharge status: Home / Self Care | Payer: Self-pay | Source: Ambulatory Visit | Attending: Vascular Surgery

## 2015-04-17 ENCOUNTER — Ambulatory Visit
Admission: RE | Admit: 2015-04-17 | Discharge: 2015-04-17 | Disposition: A | Discharge status: Home / Self Care | Payer: Medicare HMO | Source: Ambulatory Visit | Attending: Vascular Surgery | Admitting: Vascular Surgery

## 2015-04-17 DIAGNOSIS — Y832 Surgical operation with anastomosis, bypass or graft as the cause of abnormal reaction of the patient, or of later complication, without mention of misadventure at the time of the procedure: Secondary | ICD-10-CM | POA: Insufficient documentation

## 2015-04-17 DIAGNOSIS — J449 Chronic obstructive pulmonary disease, unspecified: Secondary | ICD-10-CM | POA: Insufficient documentation

## 2015-04-17 DIAGNOSIS — I2510 Atherosclerotic heart disease of native coronary artery without angina pectoris: Secondary | ICD-10-CM | POA: Diagnosis not present

## 2015-04-17 DIAGNOSIS — T82848A Pain from vascular prosthetic devices, implants and grafts, initial encounter: Secondary | ICD-10-CM | POA: Diagnosis not present

## 2015-04-17 DIAGNOSIS — E669 Obesity, unspecified: Secondary | ICD-10-CM | POA: Diagnosis not present

## 2015-04-17 DIAGNOSIS — Z794 Long term (current) use of insulin: Secondary | ICD-10-CM | POA: Diagnosis not present

## 2015-04-17 DIAGNOSIS — Z992 Dependence on renal dialysis: Secondary | ICD-10-CM | POA: Insufficient documentation

## 2015-04-17 DIAGNOSIS — E871 Hypo-osmolality and hyponatremia: Secondary | ICD-10-CM | POA: Insufficient documentation

## 2015-04-17 DIAGNOSIS — I120 Hypertensive chronic kidney disease with stage 5 chronic kidney disease or end stage renal disease: Secondary | ICD-10-CM | POA: Diagnosis not present

## 2015-04-17 DIAGNOSIS — Z79899 Other long term (current) drug therapy: Secondary | ICD-10-CM | POA: Diagnosis not present

## 2015-04-17 DIAGNOSIS — E1122 Type 2 diabetes mellitus with diabetic chronic kidney disease: Secondary | ICD-10-CM | POA: Insufficient documentation

## 2015-04-17 DIAGNOSIS — N186 End stage renal disease: Secondary | ICD-10-CM | POA: Insufficient documentation

## 2015-04-17 DIAGNOSIS — Z6841 Body Mass Index (BMI) 40.0 and over, adult: Secondary | ICD-10-CM | POA: Insufficient documentation

## 2015-04-17 HISTORY — PX: PERIPHERAL VASCULAR CATHETERIZATION: SHX172C

## 2015-04-17 LAB — GLUCOSE, CAPILLARY: GLUCOSE-CAPILLARY: 113 mg/dL — AB (ref 65–99)

## 2015-04-17 LAB — POTASSIUM (ARMC VASCULAR LAB ONLY): POTASSIUM (ARMC VASCULAR LAB): 4.1

## 2015-04-17 SURGERY — UPPER EXTREMITY ANGIOGRAPHY
Anesthesia: Moderate Sedation | Laterality: Left

## 2015-04-17 MED ORDER — FENTANYL CITRATE (PF) 100 MCG/2ML IJ SOLN
INTRAMUSCULAR | Status: AC
  Filled 2015-04-17: qty 2

## 2015-04-17 MED ORDER — SODIUM CHLORIDE 0.9 % IV SOLN: 500.0000 mL | Freq: Once | INTRAVENOUS | Status: DC | PRN

## 2015-04-17 MED ORDER — LABETALOL HCL 5 MG/ML IV SOLN: 10.0000 mg | INTRAVENOUS | Status: DC | PRN

## 2015-04-17 MED ORDER — HEPARIN SODIUM (PORCINE) 1000 UNIT/ML IJ SOLN
INTRAMUSCULAR | Status: AC
  Filled 2015-04-17: qty 1

## 2015-04-17 MED ORDER — PHENOL 1.4 % MT LIQD: 1.0000 | OROMUCOSAL | Status: DC | PRN

## 2015-04-17 MED ORDER — MORPHINE SULFATE (PF) 4 MG/ML IV SOLN: 2.0000 mg | INTRAVENOUS | Status: DC | PRN

## 2015-04-17 MED ORDER — LIDOCAINE-EPINEPHRINE (PF) 1 %-1:200000 IJ SOLN
INTRAMUSCULAR | Status: AC
  Filled 2015-04-17: qty 30

## 2015-04-17 MED ORDER — HEPARIN (PORCINE) IN NACL 2-0.9 UNIT/ML-% IJ SOLN
INTRAMUSCULAR | Status: AC
  Filled 2015-04-17: qty 1000

## 2015-04-17 MED ORDER — DEXTROSE 5 % IV SOLN: 1.5000 g | INTRAVENOUS | Status: DC

## 2015-04-17 MED ORDER — ACETAMINOPHEN 325 MG PO TABS: 325.0000 mg | ORAL_TABLET | ORAL | Status: DC | PRN

## 2015-04-17 MED ORDER — MIDAZOLAM HCL 2 MG/2ML IJ SOLN
INTRAMUSCULAR | Status: DC | PRN
  Administered 2015-04-17: 2 mg via INTRAVENOUS
  Administered 2015-04-17 (×3): 1 mg via INTRAVENOUS

## 2015-04-17 MED ORDER — SODIUM CHLORIDE 0.9 % IV SOLN
INTRAVENOUS | Status: DC
  Administered 2015-04-17: 12:00:00 via INTRAVENOUS

## 2015-04-17 MED ORDER — OXYCODONE-ACETAMINOPHEN 5-325 MG PO TABS: 1.0000 | ORAL_TABLET | ORAL | Status: DC | PRN

## 2015-04-17 MED ORDER — HYDRALAZINE HCL 20 MG/ML IJ SOLN
5.0000 mg | INTRAMUSCULAR | Status: DC | PRN
Start: 2015-04-17 — End: 2015-04-17

## 2015-04-17 MED ORDER — ALUM & MAG HYDROXIDE-SIMETH 200-200-20 MG/5ML PO SUSP: 15.0000 mL | ORAL | Status: DC | PRN

## 2015-04-17 MED ORDER — GUAIFENESIN-DM 100-10 MG/5ML PO SYRP: 15.0000 mL | ORAL_SOLUTION | ORAL | Status: DC | PRN

## 2015-04-17 MED ORDER — HEPARIN SODIUM (PORCINE) 1000 UNIT/ML IJ SOLN
INTRAMUSCULAR | Status: DC | PRN
  Administered 2015-04-17: 3000 [IU] via INTRAVENOUS
  Administered 2015-04-17: 1000 [IU] via INTRAVENOUS

## 2015-04-17 MED ORDER — MIDAZOLAM HCL 5 MG/5ML IJ SOLN
INTRAMUSCULAR | Status: AC
  Filled 2015-04-17: qty 5

## 2015-04-17 MED ORDER — ACETAMINOPHEN 325 MG RE SUPP: 325.0000 mg | RECTAL | Status: DC | PRN

## 2015-04-17 MED ORDER — FENTANYL CITRATE (PF) 100 MCG/2ML IJ SOLN
INTRAMUSCULAR | Status: DC | PRN
  Administered 2015-04-17: 25 ug via INTRAVENOUS
  Administered 2015-04-17: 50 ug via INTRAVENOUS
  Administered 2015-04-17: 25 ug via INTRAVENOUS
  Administered 2015-04-17: 50 ug via INTRAVENOUS

## 2015-04-17 MED ORDER — ONDANSETRON HCL 4 MG/2ML IJ SOLN: 4.0000 mg | Freq: Four times a day (QID) | INTRAMUSCULAR | Status: DC | PRN

## 2015-04-17 MED ORDER — DEXTROSE 5 % IV SOLN
INTRAVENOUS | Status: AC
  Filled 2015-04-17: qty 1.5

## 2015-04-17 MED ORDER — METOPROLOL TARTRATE 1 MG/ML IV SOLN: 2.0000 mg | INTRAVENOUS | Status: DC | PRN

## 2015-04-17 SURGICAL SUPPLY — 14 items
BALLN LUTONIX DCB 5X60X130 (BALLOONS) ×3
BALLOON LUTONIX DCB 5X60X130 (BALLOONS) ×2 IMPLANT
CATH ANGIO 5F 100CM .035 PIG (CATHETERS) ×3 IMPLANT
CATH H1 100CM (CATHETERS) ×3 IMPLANT
DEVICE PRESTO INFLATION (MISCELLANEOUS) ×3 IMPLANT
DEVICE STARCLOSE SE CLOSURE (Vascular Products) ×3 IMPLANT
GLIDEWIRE ANGLED SS 035X260CM (WIRE) ×3 IMPLANT
PACK ANGIOGRAPHY (CUSTOM PROCEDURE TRAY) ×3 IMPLANT
SHEATH BRITE TIP 5FRX11 (SHEATH) ×3 IMPLANT
SHEATH SHUTTLE 6FRX80 (SHEATH) ×3 IMPLANT
SYR MEDRAD MARK V 150ML (SYRINGE) ×3 IMPLANT
TUBING CONTRAST HIGH PRESS 72 (TUBING) ×3 IMPLANT
WIRE J 3MM .035X145CM (WIRE) ×3 IMPLANT
WIRE MAGIC TORQUE 260C (WIRE) ×3 IMPLANT

## 2015-04-17 NOTE — H&P (Signed)
  [facility] History & Physical Update  The patient was interviewed and re-examined.  The patient's previous History and Physical has been reviewed and is unchanged.  There is no change in the plan of care. We plan to proceed with the scheduled procedure.  DEW,JASON, MD  04/17/2015, 12:44 PM

## 2015-04-17 NOTE — Discharge Instructions (Signed)
Groin Insertion Instructions-If you lose feeling or develop tingling or pain in your leg or foot after the procedure, please walk around first.  If the discomfort does not improve , contact your physician and proceed to the nearest emergency room.  Loss of feeling in your leg might mean that a blockage has formed in the artery and this can be appropriately treated.  Limit your activity for the next two days after your procedure.  Avoid stooping, bending, heavy lifting or exertion as this may put pressure on the insertion site.  Resume normal activities in 48 hours.  You may shower after 24 hours but avoid excessive warm water and do not scrub the site.  Remove clear dressing in 48 hours.  If you have had a closure device inserted, do not soak in a tub bath or a hot tub for at least one week.  No driving for 48 hours after discharge.  After the procedure, check the insertion site occasionally.  If any oozing occurs or there is apparent swelling, firm pressure over the site will prevent a bruise from forming.  You can not hurt anything by pressing directly on the site.  The pressure stops the bleeding by allowing a small clot to form.  If the bleeding continues after the pressure has been applied for more than 15 minutes, call 911 or go to the nearest emergency room.    The x-ray dye causes you to pass a considerate amount of urine.  For this reason, you will be asked to drink plenty of liquids after the procedure to prevent dehydration.  You may resume you regular diet.  Avoid caffeine products.    For pain at the site of your procedure, take non-aspirin medicines such as Tylenol.  Medications: A. Hold Metformin for 48 hours if applicable.  B. Continue taking all your present medications at home unless your doctor prescribes any changes.Check right groin for bleeding or hematoma.  Patient will be on bedrest for 2 hours post sheath pull---out of bed at______________.  Bilateral pulses are ___________.Check  right groin for bleeding or hematoma.  Patient will be on bedrest for 2 hours post sheath pull---out of bed at______________.  Bilateral pulses are ___________.Check right groin for bleeding or hematoma.  Patient will be on bedrest for 2 hours post sheath pull---out of bed at______________.  Bilateral pulses are ___________.

## 2015-04-17 NOTE — Op Note (Signed)
OPERATIVE REPORT   PREOPERATIVE DIAGNOSIS: 1. End-stage renal disease. 2. Steal syndrome, left arm.  POSTOPERATIVE DIAGNOSIS: 1. End-stage renal disease. 2. Steal syndrome, left arm   PROCEDURE PERFORMED: 1. Ultrasound guidance vascular access to right femoral artery. 2. Catheter placement to left radial artery and left ulnar arteries  from right femoral approach. 3. Thoracic aortogram and selective left upper extremity angiogram  including selective images of the radial and ulnar arteries. 4. Percutaneous transluminal angioplasty of left radial artery to AV graft anastomosis with a 5 mm diameter by 6 cm length Lutonix drug-coated angioplasty balloon 5. StarClose closure device right femoral artery.  SURGEON: Algernon Huxley, MD  ANESTHESIA: Local with moderate conscious sedation.  BLOOD LOSS: Minimal.  FLUOROSCOPY TIME: 3.4 minutes  CONTRAST: 65 cc  INDICATION FOR PROCEDURE: This is a 64 yo AAF who presented to our office with steal syndrome. The patient's left arm AVG is working well, but her hand is numb and painful. To further evaluate this to determine what options would be possible to treat the steal syndrome, angiogram of the left upper extremity is indicated. Risks and benefits are discussed. Informed consent was obtained.  DESCRIPTION OF PROCEDURE: The patient was brought to the vascular suite. Groins were shaved and prepped and sterile surgical field was created. The right femoral head was localized with fluoroscopy and the right femoral artery was then visualized with ultrasound and found to be widely patent. It was then accessed under direct ultrasound guidance without difficulty with a Seldinger needle and a permanent image was recorded. A J-wire and 5-French sheath were then placed. Pigtail catheter was placed into the ascending aorta and a thoracic aortogram was then performed in the LAO projection. This demonstrated normal  origins to the great vessels without significant proximal stenoses and a normal configuration of the great vessels. The patient was given 4000 units of intravenous heparin and a Headhunter catheter was used to selectively cannulate the left subclavian artery without difficulty. This was then sequentially advanced to the brachial artery and to the brachial bifurcation. The patient had a high brachial bifurcation in the mid upper arm and on the initial images it appeared that the graft was anastomosed to the radial artery at the antecubital fossa. Steal was demonstrated with briskly into the graft from the radial artery but with about a 60-70% stenosis in the radial artery at the anastomosis and then seen retrograde flow from the forearm coming through the radial artery retrograde and back into the graft. I then advanced the headhunter catheter into the ulnar artery and perform selective imaging. This demonstrated no significant stenosis within the ulnar artery with flow that went all the way to the hand and a complete palmar arch was present with retrograde flow then through the radial artery and back into the graft on selective imaging from the ulnar artery. I then selectively cannulated the radial artery with a Glidewire and exchanged for a 6 [place] shuttle sheath. Selective imaging of the radial artery showed the above stenosis at the origin of the graft radial artery anastomosis and the radial artery. To try to improve the perfusion this direction and reduced retrograde flow I elected to perform angioplasty. I crossed this lesion without difficulty with a Magic torque wire and selected a 5 mm diameter by 6 cm length Lutonix drug-coated angioplasty balloon to treat the radial artery and proximal graft anastomosis. The inflation was performed to 12 atm for 1 minute. When the balloon was deflated, there was less than 10%  residual stenosis identified. This did seem to slow the retrograde flow on a injection  through the sheath but did not entirely eliminated. At this point, I elected to see how she did clinically and would consider either a covered stent across the origin to exclude retrograde flow or ligation of the radial artery just distal to the graft in the future. The diagnostic catheter was removed. Oblique arteriogram was performed of the right femoral artery and StarClose closure device deployed in the usual fashion with excellent hemostatic result. The patient tolerated the procedure well and was taken to the recovery room in stable condition.   Cynthia Dean 04/17/2015 1:21 PM

## 2015-04-20 ENCOUNTER — Encounter: Payer: Self-pay | Admitting: Vascular Surgery

## 2015-05-04 ENCOUNTER — Other Ambulatory Visit: Payer: Self-pay | Admitting: Pediatrics

## 2015-05-05 ENCOUNTER — Other Ambulatory Visit: Payer: Self-pay | Admitting: Pediatrics

## 2015-05-06 ENCOUNTER — Other Ambulatory Visit: Payer: Self-pay | Admitting: Pediatrics

## 2015-05-06 DIAGNOSIS — M5412 Radiculopathy, cervical region: Secondary | ICD-10-CM

## 2015-05-15 ENCOUNTER — Ambulatory Visit
Admission: RE | Admit: 2015-05-15 | Discharge: 2015-05-15 | Disposition: A | Discharge status: Home / Self Care | Payer: Medicare HMO | Source: Ambulatory Visit | Attending: Pediatrics | Admitting: Pediatrics

## 2015-05-15 DIAGNOSIS — M5412 Radiculopathy, cervical region: Secondary | ICD-10-CM | POA: Insufficient documentation

## 2015-05-15 DIAGNOSIS — M4802 Spinal stenosis, cervical region: Secondary | ICD-10-CM | POA: Insufficient documentation

## 2015-05-15 DIAGNOSIS — M50323 Other cervical disc degeneration at C6-C7 level: Secondary | ICD-10-CM | POA: Diagnosis not present

## 2015-07-27 DIAGNOSIS — M47812 Spondylosis without myelopathy or radiculopathy, cervical region: Secondary | ICD-10-CM | POA: Insufficient documentation

## 2015-07-27 DIAGNOSIS — E11319 Type 2 diabetes mellitus with unspecified diabetic retinopathy without macular edema: Secondary | ICD-10-CM | POA: Insufficient documentation

## 2015-07-27 DIAGNOSIS — M4726 Other spondylosis with radiculopathy, lumbar region: Secondary | ICD-10-CM | POA: Insufficient documentation

## 2015-07-28 ENCOUNTER — Other Ambulatory Visit: Payer: Self-pay | Admitting: Pediatrics

## 2015-07-28 DIAGNOSIS — Z13820 Encounter for screening for osteoporosis: Secondary | ICD-10-CM

## 2015-07-29 ENCOUNTER — Other Ambulatory Visit: Payer: Self-pay | Admitting: Vascular Surgery

## 2015-08-03 ENCOUNTER — Encounter: Admission: RE | Payer: Self-pay | Source: Ambulatory Visit

## 2015-08-03 ENCOUNTER — Encounter: Payer: Self-pay | Admitting: *Deleted

## 2015-08-03 ENCOUNTER — Ambulatory Visit: Admission: RE | Admit: 2015-08-03 | Payer: Medicare HMO | Source: Ambulatory Visit | Admitting: Vascular Surgery

## 2015-08-03 SURGERY — A/V SHUNTOGRAM/FISTULAGRAM
Anesthesia: Moderate Sedation | Laterality: Left

## 2015-08-03 MED ORDER — ONDANSETRON HCL 4 MG/2ML IJ SOLN: 4.0000 mg | Freq: Four times a day (QID) | INTRAMUSCULAR | Status: DC | PRN

## 2015-08-03 MED ORDER — HYDROMORPHONE HCL 1 MG/ML IJ SOLN: 1.0000 mg | Freq: Once | INTRAMUSCULAR | Status: DC

## 2015-08-03 MED ORDER — DEXTROSE 5 % IV SOLN: 1.5000 g | INTRAVENOUS | Status: DC

## 2015-08-03 MED ORDER — FAMOTIDINE 20 MG PO TABS: 40.0000 mg | ORAL_TABLET | ORAL | Status: DC | PRN

## 2015-08-03 MED ORDER — METHYLPREDNISOLONE SODIUM SUCC 125 MG IJ SOLR: 125.0000 mg | INTRAMUSCULAR | Status: DC | PRN

## 2015-08-03 MED ORDER — SODIUM CHLORIDE 0.9 % IV SOLN: INTRAVENOUS | Status: DC

## 2015-08-08 ENCOUNTER — Other Ambulatory Visit: Payer: Self-pay

## 2015-08-08 ENCOUNTER — Emergency Department: Payer: Medicare HMO

## 2015-08-08 ENCOUNTER — Encounter: Payer: Self-pay | Admitting: Emergency Medicine

## 2015-08-08 ENCOUNTER — Observation Stay
Admission: EM | Admit: 2015-08-08 | Discharge: 2015-08-09 | Disposition: A | Discharge status: Home / Self Care | Payer: Medicare HMO | Attending: Internal Medicine | Admitting: Internal Medicine

## 2015-08-08 DIAGNOSIS — Z7982 Long term (current) use of aspirin: Secondary | ICD-10-CM | POA: Insufficient documentation

## 2015-08-08 DIAGNOSIS — E669 Obesity, unspecified: Secondary | ICD-10-CM | POA: Diagnosis not present

## 2015-08-08 DIAGNOSIS — R7989 Other specified abnormal findings of blood chemistry: Secondary | ICD-10-CM | POA: Diagnosis present

## 2015-08-08 DIAGNOSIS — R1110 Vomiting, unspecified: Secondary | ICD-10-CM

## 2015-08-08 DIAGNOSIS — I4581 Long QT syndrome: Secondary | ICD-10-CM | POA: Diagnosis present

## 2015-08-08 DIAGNOSIS — N186 End stage renal disease: Secondary | ICD-10-CM | POA: Insufficient documentation

## 2015-08-08 DIAGNOSIS — R112 Nausea with vomiting, unspecified: Secondary | ICD-10-CM

## 2015-08-08 DIAGNOSIS — I120 Hypertensive chronic kidney disease with stage 5 chronic kidney disease or end stage renal disease: Secondary | ICD-10-CM | POA: Insufficient documentation

## 2015-08-08 DIAGNOSIS — T82858A Stenosis of vascular prosthetic devices, implants and grafts, initial encounter: Principal | ICD-10-CM | POA: Insufficient documentation

## 2015-08-08 DIAGNOSIS — R748 Abnormal levels of other serum enzymes: Secondary | ICD-10-CM | POA: Diagnosis not present

## 2015-08-08 DIAGNOSIS — Z794 Long term (current) use of insulin: Secondary | ICD-10-CM | POA: Insufficient documentation

## 2015-08-08 DIAGNOSIS — D631 Anemia in chronic kidney disease: Secondary | ICD-10-CM | POA: Diagnosis not present

## 2015-08-08 DIAGNOSIS — R1013 Epigastric pain: Secondary | ICD-10-CM | POA: Diagnosis present

## 2015-08-08 DIAGNOSIS — Y9389 Activity, other specified: Secondary | ICD-10-CM | POA: Insufficient documentation

## 2015-08-08 DIAGNOSIS — R1011 Right upper quadrant pain: Secondary | ICD-10-CM | POA: Insufficient documentation

## 2015-08-08 DIAGNOSIS — I2510 Atherosclerotic heart disease of native coronary artery without angina pectoris: Secondary | ICD-10-CM | POA: Insufficient documentation

## 2015-08-08 DIAGNOSIS — Y832 Surgical operation with anastomosis, bypass or graft as the cause of abnormal reaction of the patient, or of later complication, without mention of misadventure at the time of the procedure: Secondary | ICD-10-CM | POA: Diagnosis not present

## 2015-08-08 DIAGNOSIS — Z79899 Other long term (current) drug therapy: Secondary | ICD-10-CM | POA: Insufficient documentation

## 2015-08-08 DIAGNOSIS — E119 Type 2 diabetes mellitus without complications: Secondary | ICD-10-CM | POA: Diagnosis not present

## 2015-08-08 DIAGNOSIS — Z87891 Personal history of nicotine dependence: Secondary | ICD-10-CM | POA: Insufficient documentation

## 2015-08-08 DIAGNOSIS — J449 Chronic obstructive pulmonary disease, unspecified: Secondary | ICD-10-CM | POA: Insufficient documentation

## 2015-08-08 DIAGNOSIS — E1122 Type 2 diabetes mellitus with diabetic chronic kidney disease: Secondary | ICD-10-CM | POA: Diagnosis not present

## 2015-08-08 DIAGNOSIS — R9431 Abnormal electrocardiogram [ECG] [EKG]: Secondary | ICD-10-CM

## 2015-08-08 DIAGNOSIS — E875 Hyperkalemia: Secondary | ICD-10-CM | POA: Insufficient documentation

## 2015-08-08 DIAGNOSIS — Z803 Family history of malignant neoplasm of breast: Secondary | ICD-10-CM | POA: Diagnosis not present

## 2015-08-08 DIAGNOSIS — Z992 Dependence on renal dialysis: Secondary | ICD-10-CM | POA: Insufficient documentation

## 2015-08-08 DIAGNOSIS — R778 Other specified abnormalities of plasma proteins: Secondary | ICD-10-CM

## 2015-08-08 DIAGNOSIS — X58XXXA Exposure to other specified factors, initial encounter: Secondary | ICD-10-CM | POA: Diagnosis not present

## 2015-08-08 HISTORY — DX: Nausea with vomiting, unspecified: R11.2

## 2015-08-08 LAB — CBC WITH DIFFERENTIAL/PLATELET
BASOS PCT: 1 %
Basophils Absolute: 0.1 10*3/uL (ref 0–0.1)
Eosinophils Absolute: 0 10*3/uL (ref 0–0.7)
Eosinophils Relative: 0 %
HEMATOCRIT: 39.9 % (ref 35.0–47.0)
Hemoglobin: 12.8 g/dL (ref 12.0–16.0)
LYMPHS ABS: 1.1 10*3/uL (ref 1.0–3.6)
LYMPHS PCT: 11 %
MCH: 28.4 pg (ref 26.0–34.0)
MCHC: 32.2 g/dL (ref 32.0–36.0)
MCV: 88.2 fL (ref 80.0–100.0)
MONO ABS: 0.2 10*3/uL (ref 0.2–0.9)
MONOS PCT: 2 %
NEUTROS ABS: 9.1 10*3/uL — AB (ref 1.4–6.5)
Neutrophils Relative %: 86 %
Platelets: 202 10*3/uL (ref 150–440)
RBC: 4.53 MIL/uL (ref 3.80–5.20)
RDW: 15.7 % — AB (ref 11.5–14.5)
WBC: 10.5 10*3/uL (ref 3.6–11.0)

## 2015-08-08 LAB — COMPREHENSIVE METABOLIC PANEL
ALBUMIN: 4.2 g/dL (ref 3.5–5.0)
ALK PHOS: 262 U/L — AB (ref 38–126)
ALT: 17 U/L (ref 14–54)
ANION GAP: 15 (ref 5–15)
AST: 17 U/L (ref 15–41)
BUN: 30 mg/dL — ABNORMAL HIGH (ref 6–20)
CALCIUM: 8.4 mg/dL — AB (ref 8.9–10.3)
CHLORIDE: 96 mmol/L — AB (ref 101–111)
CO2: 23 mmol/L (ref 22–32)
Creatinine, Ser: 7.05 mg/dL — ABNORMAL HIGH (ref 0.44–1.00)
GFR calc Af Amer: 6 mL/min — ABNORMAL LOW (ref >60)
GFR calc non Af Amer: 5 mL/min — ABNORMAL LOW (ref >60)
GLUCOSE: 260 mg/dL — AB (ref 65–99)
Potassium: 5.1 mmol/L (ref 3.5–5.1)
SODIUM: 134 mmol/L — AB (ref 135–145)
Total Bilirubin: 0.8 mg/dL (ref 0.3–1.2)
Total Protein: 8.9 g/dL — ABNORMAL HIGH (ref 6.5–8.1)

## 2015-08-08 LAB — LIPASE, BLOOD: Lipase: 74 U/L — ABNORMAL HIGH (ref 11–51)

## 2015-08-08 LAB — TROPONIN I: Troponin I: 0.06 ng/mL — ABNORMAL HIGH (ref <0.031)

## 2015-08-08 MED ORDER — INSULIN ASPART PROT & ASPART (70-30 MIX) 100 UNIT/ML ~~LOC~~ SUSP
44.0000 [IU] | Freq: Two times a day (BID) | SUBCUTANEOUS | Status: DC
  Administered 2015-08-09: 44 [IU] via SUBCUTANEOUS
  Filled 2015-08-08: qty 44

## 2015-08-08 MED ORDER — ONDANSETRON 4 MG PO TBDP
4.0000 mg | ORAL_TABLET | Freq: Once | ORAL | Status: AC
  Administered 2015-08-08: 4 mg via ORAL

## 2015-08-08 MED ORDER — METOCLOPRAMIDE HCL 5 MG/ML IJ SOLN
10.0000 mg | Freq: Once | INTRAMUSCULAR | Status: AC
  Administered 2015-08-08: 10 mg via INTRAVENOUS

## 2015-08-08 MED ORDER — PANTOPRAZOLE SODIUM 40 MG IV SOLR
40.0000 mg | Freq: Once | INTRAVENOUS | Status: AC
  Administered 2015-08-08: 40 mg via INTRAVENOUS
  Filled 2015-08-08: qty 40

## 2015-08-08 MED ORDER — MORPHINE SULFATE (PF) 2 MG/ML IV SOLN
2.0000 mg | Freq: Once | INTRAVENOUS | Status: AC
  Administered 2015-08-08: 2 mg via INTRAVENOUS
  Filled 2015-08-08: qty 1

## 2015-08-08 MED ORDER — PROMETHAZINE HCL 25 MG/ML IJ SOLN
25.0000 mg | Freq: Once | INTRAMUSCULAR | Status: AC
  Administered 2015-08-08: 25 mg via INTRAVENOUS
  Filled 2015-08-08: qty 1

## 2015-08-08 MED ORDER — FAMOTIDINE IN NACL 20-0.9 MG/50ML-% IV SOLN
20.0000 mg | Freq: Once | INTRAVENOUS | Status: AC
  Administered 2015-08-08: 20 mg via INTRAVENOUS
  Filled 2015-08-08: qty 50

## 2015-08-08 MED ORDER — ONDANSETRON HCL 40 MG/20ML IJ SOLN: 8.0000 mg | Freq: Once | INTRAMUSCULAR | Status: AC

## 2015-08-08 MED ORDER — ASPIRIN 81 MG PO CHEW
324.0000 mg | CHEWABLE_TABLET | Freq: Once | ORAL | Status: AC
  Administered 2015-08-08: 324 mg via ORAL
  Filled 2015-08-08: qty 4

## 2015-08-08 MED ORDER — SODIUM CHLORIDE 0.9 % IV BOLUS (SEPSIS)
500.0000 mL | Freq: Once | INTRAVENOUS | Status: AC
  Administered 2015-08-08: 500 mL via INTRAVENOUS

## 2015-08-08 MED ORDER — ACETAMINOPHEN 650 MG RE SUPP: 650.0000 mg | Freq: Four times a day (QID) | RECTAL | Status: DC | PRN

## 2015-08-08 MED ORDER — SEVELAMER CARBONATE 800 MG PO TABS
800.0000 mg | ORAL_TABLET | Freq: Three times a day (TID) | ORAL | Status: DC
  Administered 2015-08-09: 800 mg via ORAL
  Filled 2015-08-08: qty 1

## 2015-08-08 MED ORDER — IRBESARTAN 75 MG PO TABS
150.0000 mg | ORAL_TABLET | Freq: Every day | ORAL | Status: DC
  Administered 2015-08-09: 150 mg via ORAL
  Filled 2015-08-08: qty 2

## 2015-08-08 MED ORDER — HEPARIN SODIUM (PORCINE) 5000 UNIT/ML IJ SOLN
5000.0000 [IU] | Freq: Three times a day (TID) | INTRAMUSCULAR | Status: DC
  Administered 2015-08-09: 5000 [IU] via SUBCUTANEOUS
  Filled 2015-08-08: qty 1

## 2015-08-08 MED ORDER — LORAZEPAM 2 MG/ML IJ SOLN
INTRAMUSCULAR | Status: AC
  Administered 2015-08-08: 1 mg via INTRAVENOUS
  Filled 2015-08-08: qty 1

## 2015-08-08 MED ORDER — MORPHINE SULFATE (PF) 2 MG/ML IV SOLN: 2.0000 mg | INTRAVENOUS | Status: DC | PRN

## 2015-08-08 MED ORDER — INSULIN ASPART 100 UNIT/ML ~~LOC~~ SOLN
0.0000 [IU] | Freq: Three times a day (TID) | SUBCUTANEOUS | Status: DC
  Administered 2015-08-09: 2 [IU] via SUBCUTANEOUS
  Administered 2015-08-09: 5 [IU] via SUBCUTANEOUS
  Filled 2015-08-08: qty 2
  Filled 2015-08-08: qty 5

## 2015-08-08 MED ORDER — METOCLOPRAMIDE HCL 5 MG/ML IJ SOLN
INTRAMUSCULAR | Status: AC
  Filled 2015-08-08: qty 2

## 2015-08-08 MED ORDER — LORAZEPAM 2 MG/ML IJ SOLN
1.0000 mg | Freq: Once | INTRAMUSCULAR | Status: AC
  Administered 2015-08-08: 1 mg via INTRAVENOUS

## 2015-08-08 MED ORDER — SODIUM CHLORIDE 0.9 % IJ SOLN
3.0000 mL | Freq: Two times a day (BID) | INTRAMUSCULAR | Status: DC
  Administered 2015-08-09: 3 mL via INTRAVENOUS

## 2015-08-08 MED ORDER — AMITRIPTYLINE HCL 25 MG PO TABS
25.0000 mg | ORAL_TABLET | Freq: Every day | ORAL | Status: DC
  Administered 2015-08-09: 25 mg via ORAL
  Filled 2015-08-08: qty 1

## 2015-08-08 MED ORDER — METOPROLOL SUCCINATE ER 100 MG PO TB24
100.0000 mg | ORAL_TABLET | Freq: Every day | ORAL | Status: DC
  Administered 2015-08-09: 100 mg via ORAL
  Filled 2015-08-08: qty 1

## 2015-08-08 MED ORDER — VITAMIN D3 125 MCG (5000 UT) PO CAPS: 4.0000 | ORAL_CAPSULE | Freq: Every day | ORAL | Status: DC

## 2015-08-08 MED ORDER — PANTOPRAZOLE SODIUM 40 MG PO TBEC
40.0000 mg | DELAYED_RELEASE_TABLET | Freq: Two times a day (BID) | ORAL | Status: DC
  Administered 2015-08-09 (×2): 40 mg via ORAL
  Filled 2015-08-08 (×2): qty 1

## 2015-08-08 MED ORDER — HYDRALAZINE HCL 20 MG/ML IJ SOLN
10.0000 mg | INTRAMUSCULAR | Status: DC | PRN
  Administered 2015-08-08: 10 mg via INTRAVENOUS

## 2015-08-08 MED ORDER — ONDANSETRON HCL 4 MG/2ML IJ SOLN
INTRAMUSCULAR | Status: AC
  Administered 2015-08-08: 8 mg via INTRAVENOUS
  Filled 2015-08-08: qty 4

## 2015-08-08 MED ORDER — DIPHENHYDRAMINE HCL (SLEEP) 25 MG PO TABS: 25.0000 mg | ORAL_TABLET | Freq: Every day | ORAL | Status: DC

## 2015-08-08 MED ORDER — CINACALCET HCL 30 MG PO TABS
60.0000 mg | ORAL_TABLET | Freq: Every day | ORAL | Status: DC
  Administered 2015-08-09: 60 mg via ORAL
  Filled 2015-08-08: qty 2

## 2015-08-08 MED ORDER — OXYCODONE HCL 5 MG PO TABS
5.0000 mg | ORAL_TABLET | ORAL | Status: DC | PRN
  Filled 2015-08-08: qty 1

## 2015-08-08 MED ORDER — ACETAMINOPHEN 325 MG PO TABS
650.0000 mg | ORAL_TABLET | Freq: Four times a day (QID) | ORAL | Status: DC | PRN
Start: 2015-08-08 — End: 2015-08-09

## 2015-08-08 MED ORDER — FERROUS SULFATE 325 (65 FE) MG PO TABS
325.0000 mg | ORAL_TABLET | Freq: Every day | ORAL | Status: DC
  Administered 2015-08-09: 325 mg via ORAL
  Filled 2015-08-08: qty 1

## 2015-08-08 MED ORDER — HYDRALAZINE HCL 20 MG/ML IJ SOLN
10.0000 mg | INTRAMUSCULAR | Status: DC | PRN
  Administered 2015-08-09 (×2): 10 mg via INTRAVENOUS
  Filled 2015-08-08 (×2): qty 1

## 2015-08-08 MED ORDER — IPRATROPIUM-ALBUTEROL 0.5-2.5 (3) MG/3ML IN SOLN: 3.0000 mL | Freq: Four times a day (QID) | RESPIRATORY_TRACT | Status: DC | PRN

## 2015-08-08 MED ORDER — INSULIN ASPART 100 UNIT/ML ~~LOC~~ SOLN
0.0000 [IU] | Freq: Every day | SUBCUTANEOUS | Status: DC
  Administered 2015-08-09: 4 [IU] via SUBCUTANEOUS
  Filled 2015-08-08 (×2): qty 4

## 2015-08-08 MED ORDER — LORAZEPAM 2 MG/ML IJ SOLN: 2.0000 mg | Freq: Once | INTRAMUSCULAR | Status: DC

## 2015-08-08 MED ORDER — ASPIRIN 81 MG PO CHEW
81.0000 mg | CHEWABLE_TABLET | Freq: Every day | ORAL | Status: DC
  Administered 2015-08-09: 81 mg via ORAL
  Filled 2015-08-08: qty 1

## 2015-08-08 MED ORDER — ONDANSETRON 4 MG PO TBDP
ORAL_TABLET | ORAL | Status: AC
  Administered 2015-08-08: 4 mg via ORAL
  Filled 2015-08-08: qty 1

## 2015-08-08 MED ORDER — GI COCKTAIL ~~LOC~~
30.0000 mL | ORAL | Status: AC
  Administered 2015-08-08: 30 mL via ORAL
  Filled 2015-08-08: qty 30

## 2015-08-08 MED ORDER — FLUCONAZOLE 100 MG PO TABS
50.0000 mg | ORAL_TABLET | Freq: Every day | ORAL | Status: DC
  Administered 2015-08-09: 50 mg via ORAL
  Filled 2015-08-08: qty 1

## 2015-08-08 NOTE — ED Notes (Signed)
Reports  N/v today while in dialysis ,  Pt was able to complete treatment.  States it feels like a previous  Ulcer.

## 2015-08-08 NOTE — H&P (Signed)
[facility] [place] NAME: Cynthia Dean    MR#:  [id number]  DATE OF BIRTH:  February 12, 1951   DATE OF ADMISSION:  08/08/2015  PRIMARY CARE PHYSICIAN: Lowella Bandy, MD   REQUESTING/REFERRING PHYSICIAN: Quale  CHIEF COMPLAINT:   Chief Complaint  Patient presents with  . Emesis    HISTORY OF PRESENT ILLNESS:  Cynthia Dean  is a 65 y.o. female with a known history of end-stage renal disease on hemodialysis presenting with nausea vomiting. She underwent dialysis routinely today however during she became nauseous with subsequent nonbloody nonbilious emesis. She also complains of epigastric pain described only as pain, 4/10 in intensity, nonradiating, no worsening or relieving factors. Denies any fever, chills, constipation, diarrhea. She has had a multitude of antiemetics in the emergency department without relief.  PAST MEDICAL HISTORY:   Past Medical History  Diagnosis Date  . Chronic kidney disease   . Coronary artery disease   . Diabetes mellitus without complication ([place])   . COPD (chronic obstructive pulmonary disease) ([place])   . Hypertension     PAST SURGICAL HISTORY:   Past Surgical History  Procedure Laterality Date  . Peripheral vascular catheterization Left 02/02/2015    Procedure: A/V Shuntogram/Fistulagram;  Surgeon: Algernon Huxley, MD;  Location: [facility];  Service: Cardiovascular;  Laterality: Left;  . Peripheral vascular catheterization Left 02/02/2015    Procedure: A/V Shunt Intervention;  Surgeon: Algernon Huxley, MD;  Location: [facility];  Service: Cardiovascular;  Laterality: Left;  . Breast biopsy Bilateral 07/19/00    neg  . Peripheral vascular catheterization Left 03/09/2015    Procedure: A/V Shuntogram/Fistulagram;  Surgeon: Algernon Huxley, MD;  Location: [facility];  Service: Cardiovascular;  Laterality: Left;  . Peripheral vascular catheterization N/A 03/09/2015     Procedure: A/V Shunt Intervention;  Surgeon: Algernon Huxley, MD;  Location: [facility];  Service: Cardiovascular;  Laterality: N/A;  . Peripheral vascular catheterization Left 04/17/2015    Procedure: Upper Extremity Angiography;  Surgeon: Algernon Huxley, MD;  Location: [facility];  Service: Cardiovascular;  Laterality: Left;  . Peripheral vascular catheterization  04/17/2015    Procedure: Upper Extremity Intervention;  Surgeon: Algernon Huxley, MD;  Location: [facility];  Service: Cardiovascular;;    SOCIAL HISTORY:   Social History  Substance Use Topics  . Smoking status: Former Smoker -- 0.00 packs/day    Types: Cigarettes  . Smokeless tobacco: Not on file  . Alcohol Use: No    FAMILY HISTORY:   Family History  Problem Relation Age of Onset  . Breast cancer Father 49  . Breast cancer Cousin 35    1 st cousin. Maternal     DRUG ALLERGIES:  No Known Allergies  REVIEW OF SYSTEMS:  REVIEW OF SYSTEMS:  CONSTITUTIONAL: Denies fevers, chills, fatigue, weakness.  EYES: Denies blurred vision, double vision, or eye pain.  EARS, NOSE, THROAT: Denies tinnitus, ear pain, hearing loss.  RESPIRATORY: denies cough, shortness of breath, wheezing  CARDIOVASCULAR: Denies chest pain, palpitations, edema.  GASTROINTESTINAL: Positive nausea, vomiting, , abdominal pain.   denies diarrhea/constipation  GENITOURINARY: Denies dysuria, hematuria.  ENDOCRINE: Denies nocturia or thyroid problems. HEMATOLOGIC AND LYMPHATIC: Denies easy bruising or bleeding.  SKIN: Denies rash or lesions.  MUSCULOSKELETAL: Denies pain in neck, back, shoulder, knees, hips, or further arthritic symptoms.  NEUROLOGIC: Denies paralysis, paresthesias.  PSYCHIATRIC: Denies anxiety or depressive symptoms.  Otherwise full review of systems performed by me is negative.   MEDICATIONS AT HOME:   Prior to Admission medications   Medication Sig Start Date End Date Taking? Authorizing Provider   amitriptyline (ELAVIL) 25 MG tablet Take 25 mg by mouth at bedtime.   Yes Historical Provider, MD  aspirin 81 MG tablet Take 81 mg by mouth daily.   Yes Historical Provider, MD  Cholecalciferol (VITAMIN D3) 5000 units CAPS Take 4 capsules by mouth daily.   Yes Historical Provider, MD  cinacalcet (SENSIPAR) 60 MG tablet Take 60 mg by mouth daily.   Yes Historical Provider, MD  diphenhydrAMINE (SOMINEX) 25 MG tablet Take 25 mg by mouth at bedtime.   Yes Historical Provider, MD  ferrous sulfate 325 (65 FE) MG EC tablet Take 325 mg by mouth daily with breakfast.   Yes Historical Provider, MD  fluconazole (DIFLUCAN) 100 MG tablet Take 50 mg by mouth daily.    Yes Historical Provider, MD  insulin NPH-regular Human (NOVOLIN 70/30) (70-30) 100 UNIT/ML injection Inject 44 Units into the skin 2 (two) times daily with a meal.    Yes Historical Provider, MD  ipratropium-albuterol (DUONEB) 0.5-2.5 (3) MG/3ML SOLN Take 3 mLs by nebulization every 6 (six) hours as needed (shortness of breath/ wheezing).    Yes Historical Provider, MD  metoprolol succinate (TOPROL-XL) 100 MG 24 hr tablet Take 100 mg by mouth daily. Take with or immediately following a meal.   Yes Historical Provider, MD  omeprazole (PRILOSEC) 20 MG capsule Take 20 mg by mouth daily.   Yes Historical Provider, MD  sevelamer carbonate (RENVELA) 800 MG tablet Take 800 mg by mouth 3 (three) times daily with meals.   Yes Historical Provider, MD  valsartan (DIOVAN) 160 MG tablet Take 160 mg by mouth daily.   Yes Historical Provider, MD      VITAL SIGNS:  Blood pressure 191/91, pulse 111, temperature 98.3 F (36.8 C), temperature source Oral, resp. rate 20, height 5\' 6"  (1.676 m), weight 310 lb (140.615 kg), SpO2 95 %.  PHYSICAL EXAMINATION:  VITAL SIGNS: Filed Vitals:   08/08/15 2230 08/08/15 2300  BP: 152/62 191/91  Pulse: 109 111  Temp:    Resp: 20 20   GENERAL:64 y.o.female currently in no acute distress.  HEAD: Normocephalic,  atraumatic.  EYES: Pupils equal, round, reactive to light. Extraocular muscles intact. No scleral icterus.  MOUTH: Moist mucosal membrane. Dentition intact. No abscess noted.  EAR, NOSE, THROAT: Clear without exudates. No external lesions.  NECK: Supple. No thyromegaly. No nodules. No JVD.  PULMONARY: Clear to ascultation, without wheeze rails or rhonci. No use of accessory muscles, Good respiratory effort. good air entry bilaterally CHEST: Nontender to palpation.  CARDIOVASCULAR: S1 and S2. Regular rate and rhythm. No murmurs, rubs, or gallops. No edema. Pedal pulses 2+ bilaterally.  GASTROINTESTINAL: Soft, nontender, nondistended. No masses. Positive bowel sounds. No hepatosplenomegaly.  MUSCULOSKELETAL: No swelling, clubbing, or edema. Range of motion full in all extremities.  NEUROLOGIC: Cranial nerves II through XII are intact. No gross focal neurological deficits. Sensation intact. Reflexes intact.  SKIN: No ulceration, lesions, rashes, or cyanosis. Skin warm and dry. Turgor intact.  PSYCHIATRIC: Mood, affect within normal limits. The patient is awake, alert and oriented x 3. Insight, judgment intact.    LABORATORY PANEL:   CBC  Recent Labs Lab 08/08/15 1642  WBC 10.5  HGB 12.8  HCT 39.9  PLT 202   ------------------------------------------------------------------------------------------------------------------  Chemistries   Recent Labs Lab 08/08/15 1642  NA 134*  K 5.1  CL 96*  CO2 23  GLUCOSE 260*  BUN 30*  CREATININE 7.05*  CALCIUM 8.4*  AST 17  ALT 17  ALKPHOS 262*  BILITOT 0.8   ------------------------------------------------------------------------------------------------------------------  Cardiac Enzymes  Recent Labs Lab 08/08/15 2131  TROPONINI 0.06*   ------------------------------------------------------------------------------------------------------------------  RADIOLOGY:  US Abdomen Limited Ruq  08/08/2015  CLINICAL DATA:  65 year old  female with right upper quadrant abdominal pain EXAM: US ABDOMEN LIMITED - RIGHT UPPER QUADRANT COMPARISON:  Abdominal ultrasound dated 05/04/2014 cc FINDINGS: Gallbladder: No gallstones or wall thickening visualized. No sonographic Murphy sign noted by sonographer. Common bile duct: Diameter: 3 mm Liver: There is diffuse increased hepatic echotexture which may be partially related to suboptimal penetration due to body habitus or represent fatty infiltration. IMPRESSION: No gallstone. Electronically Signed   By: Anner Crete M.D.   On: 08/08/2015 21:35    EKG:   Orders placed or performed during the hospital encounter of 08/08/15  . ED EKG  . ED EKG    IMPRESSION AND PLAN:    65 year old African American female history of end-stage renal disease on hemodialysis presenting with nausea vomiting  1. Intractable nausea, vomiting unspecified: Given her QTC greater than 500 would avoid further anti-emetics including Zofran, Reglan, Phenergan will continue with PPI therapy as well as Ativan as required for nausea after last dose of Ativan for her symptoms have improved 2. Type 2 diabetes insulin requiring: Continue basal insulin at insulin sliding scale with Accu-Cheks  3. Essential hypertension: Continue home medications at as needed hydralazine 4. End-stage renal disease on hemodialysis successfully completed dialysis today, if for some reason stays in the hospital longer wanted to consult nephrology 5. Venous thromboembolism prophylactic: Heparin subcutaneous   the records are reviewed and case discussed with ED provider. Management plans discussed with the patient, family and they are in agreement.  CODE STATUS:  full TOTAL TIME TAKING CARE OF THIS PATIENT:35 minutes.    Angelicia Lessner,  Karenann Cai.D on 08/08/2015 at 11:14 PM  Between 7am to 6pm - Pager - [phone]  After 6pm: House Pager: - [facility]  Office  [phone]  CC: Primary care physician;  Lowella Bandy, MD

## 2015-08-08 NOTE — ED Provider Notes (Signed)
Patient reports no improvement after for antiemetics and Ativan. In addition, her troponin is noted to be positive at 0.06, above reference range.  A repeat EKG performed at 2020 demonstrate sinus tachycardia with a heart rate of 110 QRS 120 QTc 500 PR 165 Nonspecific T-wave abnormalities seen including some slight slurring of the T waves in the lateral cortical leads.  Reexam of the patient continues to have nausea no further emesis. She is not able to take by mouth. She reports continuing discomfort the pain is improving she reports severe nausea. Given her history of dialysis, ultrasound does not show acute gall stone or evidence of cholecystitis, her history of end-stage renal disease, diabetes and known history of coronary disease documented in her hospital chart will admit the patient for further workup. I will give her aspirin and morphine here.  Discussed with Dr. Lavetta Nielsen will see the patient in evaluation for admission.  Delman Kitten, MD 08/08/15 2231

## 2015-08-08 NOTE — ED Provider Notes (Addendum)
[facility] Emergency Department Provider Note  ____________________________________________  Time seen: 6:30 PM  I have reviewed the triage vital signs and the nursing notes.   HISTORY  Chief Complaint Emesis    HPI Cynthia Dean is a 65 y.o. female complains of nausea vomiting today while on dialysis. Feels like previous peptic ulcer disease that she's had in the past. She is to take Carafate for that but no longer. She currently takes omeprazole and she has been compliant with this. No other changes. No fever chills chest pain shortness of breath dizziness or headaches. Denies any diarrhea had a normal bowel movement today.  Symptoms started while she was in dialysis today. She was able to complete her dialysis session.   Past Medical History  Diagnosis Date  . Chronic kidney disease   . Coronary artery disease   . Diabetes mellitus without complication ([place])   . COPD (chronic obstructive pulmonary disease) ([place])   . Hypertension      There are no active problems to display for this patient.    Past Surgical History  Procedure Laterality Date  . Peripheral vascular catheterization Left 02/02/2015    Procedure: A/V Shuntogram/Fistulagram;  Surgeon: Algernon Huxley, MD;  Location: [facility];  Service: Cardiovascular;  Laterality: Left;  . Peripheral vascular catheterization Left 02/02/2015    Procedure: A/V Shunt Intervention;  Surgeon: Algernon Huxley, MD;  Location: [facility];  Service: Cardiovascular;  Laterality: Left;  . Breast biopsy Bilateral 07/19/00    neg  . Peripheral vascular catheterization Left 03/09/2015    Procedure: A/V Shuntogram/Fistulagram;  Surgeon: Algernon Huxley, MD;  Location: [facility];  Service: Cardiovascular;  Laterality: Left;  . Peripheral vascular catheterization N/A 03/09/2015    Procedure: A/V Shunt Intervention;  Surgeon: Algernon Huxley, MD;  Location: [facility];  Service:  Cardiovascular;  Laterality: N/A;  . Peripheral vascular catheterization Left 04/17/2015    Procedure: Upper Extremity Angiography;  Surgeon: Algernon Huxley, MD;  Location: [facility];  Service: Cardiovascular;  Laterality: Left;  . Peripheral vascular catheterization  04/17/2015    Procedure: Upper Extremity Intervention;  Surgeon: Algernon Huxley, MD;  Location: [facility];  Service: Cardiovascular;;     Current Outpatient Rx  Name  Route  Sig  Dispense  Refill  . amitriptyline (ELAVIL) 25 MG tablet   Oral   Take 25 mg by mouth at bedtime.         [name] aspirin 81 MG tablet   Oral   Take 81 mg by mouth daily.         . Cholecalciferol (VITAMIN D3) 5000 units CAPS   Oral   Take 4 capsules by mouth daily.         . cinacalcet (SENSIPAR) 60 MG tablet   Oral   Take 60 mg by mouth daily.         . diphenhydrAMINE (SOMINEX) 25 MG tablet   Oral   Take 25 mg by mouth at bedtime.         . ferrous sulfate 325 (65 FE) MG EC tablet   Oral   Take 325 mg by mouth daily with breakfast.         . fluconazole (DIFLUCAN) 100 MG tablet   Oral   Take 50 mg by mouth daily.          . insulin NPH-regular Human (NOVOLIN 70/30) (70-30) 100 UNIT/ML  injection   Subcutaneous   Inject 50 Units into the skin 2 (two) times daily with a meal.          . ipratropium-albuterol (DUONEB) 0.5-2.5 (3) MG/3ML SOLN   Nebulization   Take 3 mLs by nebulization.         . metoprolol succinate (TOPROL-XL) 100 MG 24 hr tablet   Oral   Take 100 mg by mouth daily. Take with or immediately following a meal.         . omeprazole (PRILOSEC) 20 MG capsule   Oral   Take 20 mg by mouth daily.         . sevelamer carbonate (RENVELA) 800 MG tablet   Oral   Take 800 mg by mouth 3 (three) times daily with meals.         . valsartan (DIOVAN) 160 MG tablet   Oral   Take 160 mg by mouth daily.         [name] allopurinol (ZYLOPRIM) 100 MG tablet   Oral   Take 100 mg by mouth  daily.         [name] amLODipine (NORVASC) 10 MG tablet   Oral   Take 10 mg by mouth daily.         . furosemide (LASIX) 40 MG tablet   Oral   Take 40 mg by mouth.         . gabapentin (NEURONTIN) 300 MG capsule   Oral   Take 300 mg by mouth daily.          . hydrALAZINE (APRESOLINE) 100 MG tablet   Oral   Take 100 mg by mouth 2 (two) times daily.         [name] levothyroxine (SYNTHROID, LEVOTHROID) 50 MCG tablet   Oral   Take 50 mcg by mouth daily before breakfast.         . midodrine (PROAMATINE) 5 MG tablet   Oral   Take 5 mg by mouth daily. On Tuesday, Thursday and Saturday            Allergies Review of patient's allergies indicates no known allergies.   Family History  Problem Relation Age of Onset  . Breast cancer Father 8  . Breast cancer Cousin 3    1 st cousin. Maternal     Social History Social History  Substance Use Topics  . Smoking status: Former Smoker -- 0.00 packs/day    Types: Cigarettes  . Smokeless tobacco: None  . Alcohol Use: No    Review of Systems  Constitutional:   No fever or chills. No weight changes Eyes:   No blurry vision or double vision.  ENT:   No sore throat. Cardiovascular:   No chest pain. Respiratory:   No dyspnea or cough. Gastrointestinal:   Positive for abdominal pain, with vomiting but no diarrhea or constipation.  No BRBPR or melena. No stool changes Genitourinary:   Negative for dysuria, urinary retention, bloody urine, or difficulty urinating. Musculoskeletal:   Negative for back pain. No joint swelling or pain. Skin:   Negative for rash. Neurological:   Negative for headaches, focal weakness or numbness. Psychiatric:  No anxiety or depression.   Endocrine:  No hot/cold intolerance, changes in energy, or sleep difficulty.  10-point ROS otherwise negative.  ____________________________________________   PHYSICAL EXAM:  VITAL SIGNS: ED Triage Vitals  Enc Vitals Group     BP 08/08/15 1607 191/90  mmHg     Pulse Rate 08/08/15 1607  103     Resp 08/08/15 1607 20     Temp 08/08/15 1607 98.3 F (36.8 C)     Temp Source 08/08/15 1607 Oral     SpO2 08/08/15 1607 92 %     Weight 08/08/15 1607 310 lb (140.615 kg)     Height 08/08/15 1607 5\' 6"  (1.676 m)     Head Cir --      Peak Flow --      Pain Score 08/08/15 1908 0     Pain Loc --      Pain Edu? --      Excl. in [place]? --     Vital signs reviewed, nursing assessments reviewed.   Constitutional:   Alert and oriented. Uncomfortable but in no distress. Eyes:   No scleral icterus. No conjunctival pallor. PERRL. EOMI ENT   Head:   Normocephalic and atraumatic.   Nose:   No congestion/rhinnorhea. No septal hematoma   Mouth/Throat:   MMM, no pharyngeal erythema. No peritonsillar mass. No uvula shift.   Neck:   No stridor. No SubQ emphysema. No meningismus. Hematological/Lymphatic/Immunilogical:   No cervical lymphadenopathy. Cardiovascular:   RRR. Normal and symmetric distal pulses are present in all extremities. No murmurs, rubs, or gallops. Respiratory:   Normal respiratory effort without tachypnea nor retractions. Breath sounds are clear and equal bilaterally. No wheezes/rales/rhonchi. Gastrointestinal:   Soft and nontender. No distention. There is no CVA tenderness.  No rebound, rigidity, or guarding. Genitourinary:   deferred Musculoskeletal:   Nontender with normal range of motion in all extremities. No joint effusions.  No lower extremity tenderness.  No edema. Neurologic:   Normal speech and language.  CN 2-10 normal. Motor grossly intact. No pronator drift.  Normal gait. No gross focal neurologic deficits are appreciated.  Skin:    Skin is warm, dry and intact. No rash noted.  No petechiae, purpura, or bullae. Psychiatric:   Mood and affect are normal. Speech and behavior are normal. Patient exhibits appropriate insight and judgment.  ____________________________________________    LABS (pertinent  positives/negatives) (all labs ordered are listed, but only abnormal results are displayed) Labs Reviewed  COMPREHENSIVE METABOLIC PANEL - Abnormal; Notable for the following:    Sodium 134 (*)    Chloride 96 (*)    Glucose, Bld 260 (*)    BUN 30 (*)    Creatinine, Ser 7.05 (*)    Calcium 8.4 (*)    Total Protein 8.9 (*)    Alkaline Phosphatase 262 (*)    GFR calc non Af Amer 5 (*)    GFR calc Af Amer 6 (*)    All other components within normal limits  CBC WITH DIFFERENTIAL/PLATELET - Abnormal; Notable for the following:    RDW 15.7 (*)    Neutro Abs 9.1 (*)    All other components within normal limits  LIPASE, BLOOD - Abnormal; Notable for the following:    Lipase 74 (*)    All other components within normal limits   ____________________________________________   EKG  EKG interpreted by me Sinus tachycardia rate 110, normal axis. Prolonged QTC at 513. Normal QRS ST segment and T waves.  ____________________________________________    RADIOLOGY  Right Upper Quadrant Pending  ____________________________________________   PROCEDURES   ____________________________________________   INITIAL IMPRESSION / ASSESSMENT AND PLAN / ED COURSE  Pertinent labs & imaging results that were available during my care of the patient were reviewed by me and considered in my medical decision making (see chart for  details).  Patient presents with upper abdominal pain with nausea and vomiting that she feels is similar to gastritis and peptic ulcer disease she's had in the past. Exam is unremarkable. She has mild tachycardia likely related to her discomfort. Initial labs show a slight elevation in alkaline phosphatase but no other significant findings apart from her chronic baseline. We'll give a trial of GI cocktail Pepcid and Protonix and reassess. She has had Zofran and Reglan for nausea.  ----------------------------------------- 9:24 PM on  08/08/2015 -----------------------------------------  Patient was reassessed at 9:00 PM, reports that she had not had any improvement in her symptoms after the extensive GI cocktail. Additionally despite Zofran and Reglan and then subsequently Phenergan, she reported that her nausea is still a. Repeat abdominal exam showed epigastric and right upper quadrant tenderness at that time. Patient is ordered for a ultrasound of the right upper quadrant to evaluate for cholecystitis even though she is afebrile. Additionally, because of the ambiguity, we'll check an EKG and troponin although I have low suspicion for ACS.  Care the patient to be signed out to Cynthia Dean to follow-up on results.    ____________________________________________   FINAL CLINICAL IMPRESSION(S) / ED DIAGNOSES  Final diagnoses:  Epigastric pain  Non-intractable vomiting with nausea, vomiting of unspecified type      Carrie Mew, MD 08/08/15 2125  Carrie Mew, MD 08/08/15 2138

## 2015-08-08 NOTE — Care Management Obs Status (Signed)
[facility] NOTIFICATION   Patient Details  Name: Cynthia Dean MRN: [id number] Date of Birth: 1950-11-21   Medicare Observation Status Notification Given:  Yes    CrutchfieldAntony Haste, RN 08/08/2015, 11:57 PM

## 2015-08-08 NOTE — ED Notes (Signed)
Pt taken to US

## 2015-08-08 NOTE — ED Notes (Addendum)
Per unit secretary, receiving nurse Rober Minion unable to take report at this time and will call this nurse back in a few minutes

## 2015-08-09 LAB — BASIC METABOLIC PANEL
ANION GAP: 12 (ref 5–15)
Anion gap: 13 (ref 5–15)
BUN: 36 mg/dL — ABNORMAL HIGH (ref 6–20)
BUN: 41 mg/dL — ABNORMAL HIGH (ref 6–20)
CALCIUM: 8.5 mg/dL — AB (ref 8.9–10.3)
CALCIUM: 8.1 mg/dL — AB (ref 8.9–10.3)
CHLORIDE: 98 mmol/L — AB (ref 101–111)
CO2: 22 mmol/L (ref 22–32)
CO2: 22 mmol/L (ref 22–32)
CREATININE: 7.64 mg/dL — AB (ref 0.44–1.00)
Chloride: 97 mmol/L — ABNORMAL LOW (ref 101–111)
Creatinine, Ser: 7.6 mg/dL — ABNORMAL HIGH (ref 0.44–1.00)
GFR calc Af Amer: 6 mL/min — ABNORMAL LOW (ref >60)
GFR calc non Af Amer: 5 mL/min — ABNORMAL LOW (ref >60)
GFR, EST AFRICAN AMERICAN: 6 mL/min — AB (ref >60)
GFR, EST NON AFRICAN AMERICAN: 5 mL/min — AB (ref >60)
GLUCOSE: 311 mg/dL — AB (ref 65–99)
Glucose, Bld: 350 mg/dL — ABNORMAL HIGH (ref 65–99)
Potassium: 6.8 mmol/L (ref 3.5–5.1)
Potassium: 6.1 mmol/L — ABNORMAL HIGH (ref 3.5–5.1)
Sodium: 132 mmol/L — ABNORMAL LOW (ref 135–145)
Sodium: 132 mmol/L — ABNORMAL LOW (ref 135–145)

## 2015-08-09 LAB — TROPONIN I
TROPONIN I: 0.21 ng/mL — AB (ref <0.031)
TROPONIN I: 0.23 ng/mL — AB (ref <0.031)
Troponin I: 0.09 ng/mL — ABNORMAL HIGH (ref <0.031)

## 2015-08-09 LAB — CBC
HCT: 38.7 % (ref 35.0–47.0)
Hemoglobin: 12.3 g/dL (ref 12.0–16.0)
MCH: 27.5 pg (ref 26.0–34.0)
MCHC: 31.8 g/dL — ABNORMAL LOW (ref 32.0–36.0)
MCV: 86.6 fL (ref 80.0–100.0)
PLATELETS: 201 10*3/uL (ref 150–440)
RBC: 4.47 MIL/uL (ref 3.80–5.20)
RDW: 15.8 % — AB (ref 11.5–14.5)
WBC: 11.2 10*3/uL — ABNORMAL HIGH (ref 3.6–11.0)

## 2015-08-09 LAB — GLUCOSE, CAPILLARY
Glucose-Capillary: 160 mg/dL — ABNORMAL HIGH (ref 65–99)
Glucose-Capillary: 285 mg/dL — ABNORMAL HIGH (ref 65–99)
Glucose-Capillary: 302 mg/dL — ABNORMAL HIGH (ref 65–99)

## 2015-08-09 LAB — HEMOGLOBIN A1C: HEMOGLOBIN A1C: 6.9 % — AB (ref 4.0–6.0)

## 2015-08-09 LAB — POTASSIUM: Potassium: 4.6 mmol/L (ref 3.5–5.1)

## 2015-08-09 MED ORDER — DIPHENHYDRAMINE HCL 25 MG PO CAPS
25.0000 mg | ORAL_CAPSULE | Freq: Every day | ORAL | Status: DC
  Administered 2015-08-09: 25 mg via ORAL
  Filled 2015-08-09: qty 1

## 2015-08-09 MED ORDER — INSULIN REGULAR HUMAN 100 UNIT/ML IJ SOLN
10.0000 [IU] | Freq: Once | INTRAMUSCULAR | Status: AC
  Administered 2015-08-09: 10 [IU] via INTRAVENOUS
  Filled 2015-08-09: qty 0.1

## 2015-08-09 MED ORDER — SODIUM CHLORIDE 0.9 % IV SOLN
Freq: Once | INTRAVENOUS | Status: AC
  Administered 2015-08-09: 02:00:00 via INTRAVENOUS

## 2015-08-09 MED ORDER — SODIUM POLYSTYRENE SULFONATE 15 GM/60ML PO SUSP
30.0000 g | Freq: Once | ORAL | Status: AC
  Administered 2015-08-09: 30 g via ORAL
  Filled 2015-08-09: qty 120

## 2015-08-09 MED ORDER — DEXTROSE 50 % IV SOLN
25.0000 mL | Freq: Once | INTRAVENOUS | Status: AC
  Administered 2015-08-09: 25 mL via INTRAVENOUS
  Filled 2015-08-09: qty 50

## 2015-08-09 MED ORDER — VITAMIN D (ERGOCALCIFEROL) 1.25 MG (50000 UNIT) PO CAPS
50000.0000 [IU] | ORAL_CAPSULE | ORAL | Status: DC
  Filled 2015-08-09: qty 1

## 2015-08-09 NOTE — Progress Notes (Signed)
Pt to be discharged this evening. Potassium level w.n.l. Dinner taken well. Pt feeling better. disch instructions given to pt and family. Iv and tele removed. disch via w.c. Accompanied by friend.

## 2015-08-09 NOTE — Progress Notes (Signed)
Pt returned from dialysis. A/o. Feeling "weak."  Diet ordered.. Will check pt  sats on r/a.

## 2015-08-09 NOTE — Progress Notes (Signed)
[facility] [place] NAME: Cynthia Dean    MR#:  [id number]  DATE OF BIRTH:  [birth year]  SUBJECTIVE:   Patient without nausea or vomiting this morning. Patient denies chest pain or shortness of breath.  REVIEW OF SYSTEMS:    Review of Systems  Constitutional: Negative for fever, chills and malaise/fatigue.  HENT: Negative for sore throat.   Eyes: Negative for blurred vision.  Respiratory: Negative for cough, hemoptysis, shortness of breath and wheezing.   Cardiovascular: Negative for chest pain, palpitations and leg swelling.  Gastrointestinal: Negative for nausea, vomiting, abdominal pain, diarrhea and blood in stool.  Genitourinary: Negative for dysuria.  Musculoskeletal: Negative for back pain.  Neurological: Negative for dizziness, tremors and headaches.  Endo/Heme/Allergies: Does not bruise/bleed easily.    Tolerating Diet: Yes      DRUG ALLERGIES:  No Known Allergies  VITALS:  Blood pressure 181/71, pulse 108, temperature 98.1 F (36.7 C), temperature source Oral, resp. rate 18, height 5\' 6"  (1.676 m), weight 140.615 kg (310 lb), SpO2 98 %.  PHYSICAL EXAMINATION:   Physical Exam  Constitutional: She is oriented to person, place, and time and well-developed, well-nourished, and in no distress. No distress.  HENT:  Head: Normocephalic.  Eyes: No scleral icterus.  Neck: Normal range of motion. Neck supple. No JVD present. No tracheal deviation present.  Cardiovascular: Normal rate, regular rhythm and normal heart sounds.  Exam reveals no gallop and no friction rub.   No murmur heard. Pulmonary/Chest: Effort normal and breath sounds normal. No respiratory distress. She has no wheezes. She has no rales. She exhibits no tenderness.  Abdominal: Soft. Bowel sounds are normal. She exhibits no distension and no mass. There is no tenderness. There is no rebound and no guarding.  Musculoskeletal: Normal range of motion.  She exhibits no edema.  Neurological: She is alert and oriented to person, place, and time.  Skin: Skin is warm. No rash noted. No erythema.  Psychiatric: Affect and judgment normal.      LABORATORY PANEL:   CBC  Recent Labs Lab 08/09/15 0032  WBC 11.2*  HGB 12.3  HCT 38.7  PLT 201   ------------------------------------------------------------------------------------------------------------------  Chemistries   Recent Labs Lab 08/08/15 1642  08/09/15 0607  NA 134*  < > 132*  K 5.1  < > 6.1*  CL 96*  < > 98*  CO2 23  < > 22  GLUCOSE 260*  < > 311*  BUN 30*  < > 41*  CREATININE 7.05*  < > 7.60*  CALCIUM 8.4*  < > 8.5*  AST 17  --   --   ALT 17  --   --   ALKPHOS 262*  --   --   BILITOT 0.8  --   --   < > = values in this interval not displayed. ------------------------------------------------------------------------------------------------------------------  Cardiac Enzymes  Recent Labs Lab 08/08/15 2131 08/09/15 0032 08/09/15 0607  TROPONINI 0.06* 0.09* 0.21*   ------------------------------------------------------------------------------------------------------------------  RADIOLOGY:  US Abdomen Limited Ruq  08/08/2015  CLINICAL DATA:  65 year old female with right upper quadrant abdominal pain EXAM: US ABDOMEN LIMITED - RIGHT UPPER QUADRANT COMPARISON:  Abdominal ultrasound dated 05/04/2014 cc FINDINGS: Gallbladder: No gallstones or wall thickening visualized. No sonographic Murphy sign noted by sonographer. Common bile duct: Diameter: 3 mm Liver: There is diffuse increased hepatic echotexture which may be partially related to suboptimal penetration due to body habitus or represent fatty infiltration. IMPRESSION: No gallstone. Electronically Signed  By: Anner Crete M.D.   On: 08/08/2015 21:35     ASSESSMENT AND PLAN:    65 year old female with end-stage renal disease on hemodialysis who presented with nausea and vomiting.  1. Nausea and  vomiting: This is likely due to gastric antritis. Her symptoms have improved. I will advance diet to renal/diabetic diet.  2. End-stage renal disease on hemodialysis with hyperkalemia: In for dialysis this morning. I have also prescribed insulin, dextrose and Kayexalate.  3. Elevated troponin: This is likely due to demand ischemia. Continue to monitor troponin. Patient is without chest pain.   4. Type 2 diabetes insulin requiring: Continue sliding scale and 7, ADA diet and her outpatient regimen.  8. Essential hypertension: Continue metoprolol and Diovan.  Management plans discussed with the patient and she is in agreement.  CODE STATUS: FULl  TOTAL TIME TAKING CARE OF THIS PATIENT: 30 minutes.     POSSIBLE D/C today after HD if troponin is trending down.    Jaleesa Cervi M.D on 08/09/2015 at 10:53 AM  Between 7am to 6pm - Pager - [phone] After 6pm go to [email or web address] - password [email or web address] [facility]  Office  [phone]  CC: Primary care physician; Lowella Bandy, MD  Note: This dictation was prepared with Dragon dictation along with smaller phrase technology. Any transcriptional errors that result from this process are unintentional.

## 2015-08-09 NOTE — Progress Notes (Signed)
RN notified from lab that pt's potassium was elevated at 6.8. MD Hower notified about potassium level and elevated troponin level. No new orders given at this time. Will continue to monitor.   Iran Sizer M

## 2015-08-09 NOTE — Progress Notes (Signed)
Subjective:   Patient reports that she had nausea and vomiting at dialysis. Then she went home and continued to have nausea and abdominal pain. She came to the hospital for evaluation. In the emergency room, her potassium was noted to be high at 6.8 Today's potassium is 6.1 Troponin is mildly elevated at 0.21 Hemoglobin is normal Right upper quadrant ultrasound shows fatty infiltration.   Objective:  Vital signs in last 24 hours:  Temp:  [98 F (36.7 C)-98.3 F (36.8 C)] 98.1 F (36.7 C) (01/15 0547) Pulse Rate:  [103-114] 108 (01/15 0704) Resp:  [18-23] 18 (01/15 0547) BP: (147-194)/(62-114) 181/71 mmHg (01/15 0704) SpO2:  [92 %-100 %] 98 % (01/15 0547) Weight:  [140.615 kg (310 lb)] 140.615 kg (310 lb) (01/14 1607)  Weight change:  Filed Weights   08/08/15 1607  Weight: 140.615 kg (310 lb)    Intake/Output:    Intake/Output Summary (Last 24 hours) at 08/09/15 1123 Last data filed at 08/09/15 0830  Gross per 24 hour  Intake    760 ml  Output    250 ml  Net    510 ml     Physical Exam: General:  no acute distress, laying in the bed   HEENT  anicteric, moist oral mucous membranes   Neck  supple   Pulm/lungs  clear to auscultation   CVS/Heart  no rub or gallop   Abdomen:   obese, soft   Extremities:  no peripheral edema   Neurologic:  alert, oriented   Skin:  no acute rashes   Access:  left arm AV fistula        Basic Metabolic Panel:   Recent Labs Lab 08/08/15 1642 08/09/15 0032 08/09/15 0607  NA 134* 132* 132*  K 5.1 6.8* 6.1*  CL 96* 97* 98*  CO2 23 22 22   GLUCOSE 260* 350* 311*  BUN 30* 36* 41*  CREATININE 7.05* 7.64* 7.60*  CALCIUM 8.4* 8.1* 8.5*     CBC:  Recent Labs Lab 08/08/15 1642 08/09/15 0032  WBC 10.5 11.2*  NEUTROABS 9.1*  --   HGB 12.8 12.3  HCT 39.9 38.7  MCV 88.2 86.6  PLT 202 201      Microbiology:  No results found for this or any previous visit (from the past 720 hour(s)).  Coagulation Studies: No results  for input(s): LABPROT, INR in the last 72 hours.  Urinalysis: No results for input(s): COLORURINE, LABSPEC, PHURINE, GLUCOSEU, HGBUR, BILIRUBINUR, KETONESUR, PROTEINUR, UROBILINOGEN, NITRITE, LEUKOCYTESUR in the last 72 hours.  Invalid input(s): APPERANCEUR    Imaging: US Abdomen Limited Ruq  08/08/2015  CLINICAL DATA:  65 year old female with right upper quadrant abdominal pain EXAM: US ABDOMEN LIMITED - RIGHT UPPER QUADRANT COMPARISON:  Abdominal ultrasound dated 05/04/2014 cc FINDINGS: Gallbladder: No gallstones or wall thickening visualized. No sonographic Murphy sign noted by sonographer. Common bile duct: Diameter: 3 mm Liver: There is diffuse increased hepatic echotexture which may be partially related to suboptimal penetration due to body habitus or represent fatty infiltration. IMPRESSION: No gallstone. Electronically Signed   By: Anner Crete M.D.   On: 08/08/2015 21:35     Medications:     . amitriptyline  25 mg Oral QHS  . aspirin  81 mg Oral Daily  . cinacalcet  60 mg Oral Daily  . diphenhydrAMINE  25 mg Oral QHS  . ferrous sulfate  325 mg Oral Q breakfast  . fluconazole  50 mg Oral Daily  . heparin  5,000 Units Subcutaneous 3 times  per day  . insulin aspart  0-5 Units Subcutaneous QHS  . insulin aspart  0-9 Units Subcutaneous TID WC  . insulin aspart protamine- aspart  44 Units Subcutaneous BID WC  . irbesartan  150 mg Oral Daily  . LORazepam  2 mg Intravenous Once  . metoprolol succinate  100 mg Oral Daily  . pantoprazole  40 mg Oral BID  . sevelamer carbonate  800 mg Oral TID WC  . sodium chloride  3 mL Intravenous Q12H  . Vitamin D (Ergocalciferol)  50,000 Units Oral Q48H   acetaminophen **OR** acetaminophen, hydrALAZINE, ipratropium-albuterol, morphine injection, oxyCODONE  Assessment/ Plan:  65 y.o. female with end-stage renal disease, hemodialysis dependent, diabetes, COPD, hypertension, cervical disc disease, presents with nausea and vomiting   1.  End-stage renal disease. [employer]. Tuesday/Thursday/Saturday. [facility] - Urgent dialysis today due to hyperkalemia  2. Hyperkalemia - Urgent dialysis today - low K bath  3. Nausea/vomiting - Evaluation is in progress  4. Secondary hyperparathyroidism - Monitor phosphorus during this admission  5. Anemia of chronic kidney disease - Hemoglobin 12.3 - Monitor    LOS:  Leesa Leifheit 1/15/201711:23 AM

## 2015-08-09 NOTE — Care Management Note (Addendum)
Case Management Note  Patient Details  Name: Cynthia Dean MRN: [id number] Date of Birth: 03/23/1951  Subjective/Objective:       Discussed if there is a need for continuous oxygen for [name] with her [place] today. [name] has PRN oxygen at home. [name] is currently downstairs in dialysis and when she returns to the medical floor [name] will evaluate if there is a need for continuous oxygen.              Action/Plan:   Expected Discharge Date:                  Expected Discharge Plan:     In-House Referral:     Discharge planning Services     Post Acute Care Choice:    Choice offered to:     DME Arranged:    DME Agency:     HH Arranged:    [place] Agency:     Status of Service:     Medicare Important Message Given:    Date Medicare IM Given:    Medicare IM give by:    Date Additional Medicare IM Given:    Additional Medicare Important Message give by:     If discussed at [place] of Stay Meetings, dates discussed:    Additional Comments:  Hind Chesler A, RN 08/09/2015, 11:28 AM

## 2015-08-09 NOTE — Discharge Summary (Signed)
[facility] [place] NAME: Cynthia Dean    MR#:  [id number]  DATE OF BIRTH:  Mar 19, 1951  DATE OF ADMISSION:  08/08/2015 ADMITTING PHYSICIAN: Lytle Butte, MD  DATE OF DISCHARGE: 08/09/2015 PRIMARY CARE PHYSICIAN: Lowella Bandy, MD    ADMISSION DIAGNOSIS:  Epigastric pain [R10.13] Prolonged Q-T interval on ECG [I45.81] Troponin I above reference range [R79.89] Intractable vomiting with nausea, vomiting of unspecified type [R11.10]  DISCHARGE DIAGNOSIS:  Active Problems:   Intractable nausea and vomiting   SECONDARY DIAGNOSIS:   Past Medical History  Diagnosis Date  . Chronic kidney disease   . Coronary artery disease   . Diabetes mellitus without complication ([place])   . COPD (chronic obstructive pulmonary disease) ([place])   . Hypertension     HOSPITAL COURSE:  65 year old female with end-stage renal disease on hemodialysis who presented with nausea and vomiting.  1. Nausea and vomiting: This is likely due to gastric antritis. Her symptoms have improved.    2. End-stage renal disease on hemodialysis with hyperkalemia: Resolved after receiving hemodialysis this morning.  3. Elevated troponin: This is likely due to demand ischemia with POOR renal clearance and not ACS.[name] Patient is without chest pain.   4. Type 2 diabetes insulin requiring: Continue sliding scale and 7, ADA diet and her outpatient regimen.  8. Essential hypertension: Continue metoprolol and Diovan.     DISCHARGE CONDITIONS AND DIET:  Stable  Renal/diabetic diet  CONSULTS OBTAINED:  Treatment Team:  Lytle Butte, MD Murlean Iba, MD  DRUG ALLERGIES:  No Known Allergies  DISCHARGE MEDICATIONS:   Current Discharge Medication List    CONTINUE these medications which have NOT CHANGED   Details  amitriptyline (ELAVIL) 25 MG tablet Take 25 mg by mouth at bedtime.    aspirin 81 MG tablet Take 81 mg by mouth daily.    Cholecalciferol  (VITAMIN D3) 5000 units CAPS Take 4 capsules by mouth daily.    cinacalcet (SENSIPAR) 60 MG tablet Take 60 mg by mouth daily.    diphenhydrAMINE (SOMINEX) 25 MG tablet Take 25 mg by mouth at bedtime.    ferrous sulfate 325 (65 FE) MG EC tablet Take 325 mg by mouth daily with breakfast.    fluconazole (DIFLUCAN) 100 MG tablet Take 50 mg by mouth daily.     insulin NPH-regular Human (NOVOLIN 70/30) (70-30) 100 UNIT/ML injection Inject 44 Units into the skin 2 (two) times daily with a meal.     ipratropium-albuterol (DUONEB) 0.5-2.5 (3) MG/3ML SOLN Take 3 mLs by nebulization every 6 (six) hours as needed (shortness of breath/ wheezing).     metoprolol succinate (TOPROL-XL) 100 MG 24 hr tablet Take 100 mg by mouth daily. Take with or immediately following a meal.    omeprazole (PRILOSEC) 20 MG capsule Take 20 mg by mouth daily.    sevelamer carbonate (RENVELA) 800 MG tablet Take 800 mg by mouth 3 (three) times daily with meals.    valsartan (DIOVAN) 160 MG tablet Take 160 mg by mouth daily.              Today   CHIEF COMPLAINT:  Patient doing well this morning. Patient is tolerating clear liquid diet.  VITAL SIGNS:  Blood pressure 181/71, pulse 108, temperature 98.1 F (36.7 C), temperature source Oral, resp. rate 18, height 5\' 6"  (1.676 m), weight 140.615 kg (310 lb), SpO2 98 %.   REVIEW OF SYSTEMS:  Review of Systems  Constitutional: Negative for fever, chills and malaise/fatigue.  HENT: Negative for sore throat.   Eyes: Negative for blurred vision.  Respiratory: Negative for cough, hemoptysis, shortness of breath and wheezing.   Cardiovascular: Negative for chest pain, palpitations and leg swelling.  Gastrointestinal: Negative for nausea, vomiting, abdominal pain, diarrhea and blood in stool.  Genitourinary: Negative for dysuria.  Musculoskeletal: Negative for back pain.  Neurological: Negative for dizziness, tremors and headaches.  Endo/Heme/Allergies: Does not  bruise/bleed easily.     PHYSICAL EXAMINATION:  GENERAL:  65 y.o.-year-old patient lying in the bed with no acute distress.  NECK:  Supple, no jugular venous distention. No thyroid enlargement, no tenderness.  LUNGS: Normal breath sounds bilaterally, no wheezing, rales,rhonchi  No use of accessory muscles of respiration.  CARDIOVASCULAR: S1, S2 normal. No murmurs, rubs, or gallops.  ABDOMEN: Soft, non-tender, non-distended. Bowel sounds present. No organomegaly or mass.  EXTREMITIES: No pedal edema, cyanosis, or clubbing.  PSYCHIATRIC: The patient is alert and oriented x 3.  SKIN: No obvious rash, lesion, or ulcer.   DATA REVIEW:   CBC  Recent Labs Lab 08/09/15 0032  WBC 11.2*  HGB 12.3  HCT 38.7  PLT 201    Chemistries   Recent Labs Lab 08/08/15 1642  08/09/15 0607  NA 134*  < > 132*  K 5.1  < > 6.1*  CL 96*  < > 98*  CO2 23  < > 22  GLUCOSE 260*  < > 311*  BUN 30*  < > 41*  CREATININE 7.05*  < > 7.60*  CALCIUM 8.4*  < > 8.5*  AST 17  --   --   ALT 17  --   --   ALKPHOS 262*  --   --   BILITOT 0.8  --   --   < > = values in this interval not displayed.  Cardiac Enzymes  Recent Labs Lab 08/08/15 2131 08/09/15 0032 08/09/15 0607  TROPONINI 0.06* 0.09* 0.21*    Microbiology Results  @[email or web address] @  RADIOLOGY:  US Abdomen Limited Ruq  08/08/2015  CLINICAL DATA:  65 year old female with right upper quadrant abdominal pain EXAM: US ABDOMEN LIMITED - RIGHT UPPER QUADRANT COMPARISON:  Abdominal ultrasound dated 05/04/2014 cc FINDINGS: Gallbladder: No gallstones or wall thickening visualized. No sonographic Murphy sign noted by sonographer. Common bile duct: Diameter: 3 mm Liver: There is diffuse increased hepatic echotexture which may be partially related to suboptimal penetration due to body habitus or represent fatty infiltration. IMPRESSION: No gallstone. Electronically Signed   By: Anner Crete M.D.   On: 08/08/2015 21:35      Management plans  discussed with the patient and she is in agreement. Stable for discharge   Patient should follow up with PCP 1 week  CODE STATUS:     Code Status Orders        Start     Ordered   08/08/15 2256  Full code   Continuous     08/08/15 2256    Code Status History    Date Active Date Inactive Code Status Order ID Comments User Context   04/17/2015  1:34 PM 04/17/2015  6:36 PM Full Code [id number]  Algernon Huxley, MD Inpatient      TOTAL TIME TAKING CARE OF THIS PATIENT: 35 minutes.    Note: This dictation was prepared with Dragon dictation along with smaller phrase technology. Any transcriptional errors that result from this process are unintentional.  Daelyn Pettaway M.D on 08/09/2015 at 10:56 AM  Between 7am to 6pm - Pager - [phone] After 6pm go to [email or web address] - password [email or web address] [facility]  Office  [phone]  CC: Primary care physician; Lowella Bandy, MD

## 2015-08-10 ENCOUNTER — Encounter: Payer: Self-pay | Admitting: *Deleted

## 2015-08-10 ENCOUNTER — Encounter
Admission: RE | Disposition: A | Discharge status: Home / Self Care | Payer: Self-pay | Source: Ambulatory Visit | Attending: Vascular Surgery

## 2015-08-10 ENCOUNTER — Ambulatory Visit
Admission: RE | Admit: 2015-08-10 | Discharge: 2015-08-10 | Disposition: A | Discharge status: Home / Self Care | Payer: Medicare HMO | Source: Ambulatory Visit | Attending: Vascular Surgery | Admitting: Vascular Surgery

## 2015-08-10 DIAGNOSIS — N186 End stage renal disease: Secondary | ICD-10-CM | POA: Insufficient documentation

## 2015-08-10 DIAGNOSIS — J449 Chronic obstructive pulmonary disease, unspecified: Secondary | ICD-10-CM | POA: Insufficient documentation

## 2015-08-10 DIAGNOSIS — E119 Type 2 diabetes mellitus without complications: Secondary | ICD-10-CM | POA: Insufficient documentation

## 2015-08-10 DIAGNOSIS — I120 Hypertensive chronic kidney disease with stage 5 chronic kidney disease or end stage renal disease: Secondary | ICD-10-CM | POA: Insufficient documentation

## 2015-08-10 DIAGNOSIS — I2510 Atherosclerotic heart disease of native coronary artery without angina pectoris: Secondary | ICD-10-CM | POA: Insufficient documentation

## 2015-08-10 DIAGNOSIS — Z794 Long term (current) use of insulin: Secondary | ICD-10-CM | POA: Insufficient documentation

## 2015-08-10 DIAGNOSIS — Z992 Dependence on renal dialysis: Secondary | ICD-10-CM | POA: Insufficient documentation

## 2015-08-10 DIAGNOSIS — T82858A Stenosis of vascular prosthetic devices, implants and grafts, initial encounter: Secondary | ICD-10-CM | POA: Insufficient documentation

## 2015-08-10 DIAGNOSIS — Y832 Surgical operation with anastomosis, bypass or graft as the cause of abnormal reaction of the patient, or of later complication, without mention of misadventure at the time of the procedure: Secondary | ICD-10-CM | POA: Insufficient documentation

## 2015-08-10 DIAGNOSIS — E669 Obesity, unspecified: Secondary | ICD-10-CM | POA: Insufficient documentation

## 2015-08-10 HISTORY — PX: PERIPHERAL VASCULAR CATHETERIZATION: SHX172C

## 2015-08-10 LAB — POTASSIUM (ARMC VASCULAR LAB ONLY): POTASSIUM (ARMC VASCULAR LAB): 4.3 (ref 3.5–5.1)

## 2015-08-10 LAB — GLUCOSE, CAPILLARY: Glucose-Capillary: 175 mg/dL — ABNORMAL HIGH (ref 65–99)

## 2015-08-10 SURGERY — A/V SHUNTOGRAM/FISTULAGRAM
Anesthesia: Moderate Sedation

## 2015-08-10 MED ORDER — ONDANSETRON HCL 4 MG/2ML IJ SOLN
INTRAMUSCULAR | Status: AC
  Administered 2015-08-10: 13:00:00
  Filled 2015-08-10: qty 2

## 2015-08-10 MED ORDER — HEPARIN SODIUM (PORCINE) 1000 UNIT/ML IJ SOLN
INTRAMUSCULAR | Status: AC
  Filled 2015-08-10: qty 1

## 2015-08-10 MED ORDER — FENTANYL CITRATE (PF) 100 MCG/2ML IJ SOLN
INTRAMUSCULAR | Status: AC
  Filled 2015-08-10: qty 2

## 2015-08-10 MED ORDER — LIDOCAINE-EPINEPHRINE (PF) 1 %-1:200000 IJ SOLN
INTRAMUSCULAR | Status: AC
  Filled 2015-08-10: qty 30

## 2015-08-10 MED ORDER — SODIUM CHLORIDE 0.9 % IV SOLN
INTRAVENOUS | Status: DC
  Administered 2015-08-10 (×2): via INTRAVENOUS

## 2015-08-10 MED ORDER — HEPARIN SODIUM (PORCINE) 1000 UNIT/ML IJ SOLN
INTRAMUSCULAR | Status: DC | PRN
  Administered 2015-08-10: 4000 [IU] via INTRAVENOUS

## 2015-08-10 MED ORDER — MIDAZOLAM HCL 5 MG/5ML IJ SOLN
INTRAMUSCULAR | Status: AC
  Filled 2015-08-10: qty 5

## 2015-08-10 MED ORDER — HEPARIN (PORCINE) IN NACL 2-0.9 UNIT/ML-% IJ SOLN
INTRAMUSCULAR | Status: AC
  Filled 2015-08-10: qty 1000

## 2015-08-10 MED ORDER — IOHEXOL 300 MG/ML  SOLN
INTRAMUSCULAR | Status: DC | PRN
  Administered 2015-08-10: 25 mL via INTRAVENOUS

## 2015-08-10 MED ORDER — DEXTROSE 5 % IV SOLN
INTRAVENOUS | Status: AC
  Filled 2015-08-10: qty 1.5

## 2015-08-10 MED ORDER — MIDAZOLAM HCL 2 MG/2ML IJ SOLN
INTRAMUSCULAR | Status: DC | PRN
  Administered 2015-08-10: 2 mg via INTRAVENOUS

## 2015-08-10 MED ORDER — FENTANYL CITRATE (PF) 100 MCG/2ML IJ SOLN
INTRAMUSCULAR | Status: DC | PRN
  Administered 2015-08-10: 50 ug via INTRAVENOUS

## 2015-08-10 SURGICAL SUPPLY — 9 items
BALLN DORADO 7X60X80 (BALLOONS) ×2
BALLOON DORADO 7X60X80 (BALLOONS) ×1 IMPLANT
CANNULA 5F STIFF (CANNULA) ×2 IMPLANT
DEVICE PRESTO INFLATION (MISCELLANEOUS) ×2 IMPLANT
DRAPE BRACHIAL (DRAPES) ×2 IMPLANT
PACK ANGIOGRAPHY (CUSTOM PROCEDURE TRAY) ×2 IMPLANT
SHEATH BRITE TIP 6FRX5.5 (SHEATH) ×2 IMPLANT
TOWEL OR 17X26 4PK STRL BLUE (TOWEL DISPOSABLE) ×2 IMPLANT
WIRE MAGIC TOR.035 180C (WIRE) ×2 IMPLANT

## 2015-08-10 NOTE — Discharge Instructions (Signed)
Fistulogram, Care After °Refer to this sheet in the next few weeks. These instructions provide you with information on caring for yourself after your procedure. Your health care provider may also give you more specific instructions. Your treatment has been planned according to current medical practices, but problems sometimes occur. Call your health care provider if you have any problems or questions after your procedure. °WHAT TO EXPECT AFTER THE PROCEDURE °After your procedure, it is typical to have the following: °· A small amount of discomfort in the area where the catheters were placed. °· A small amount of bruising around the fistula. °· Sleepiness and fatigue. °HOME CARE INSTRUCTIONS °· Rest at home for the day following your procedure. °· Do not drive or operate heavy machinery while taking pain medicine. °· Take medicines only as directed by your health care provider. °· Do not take baths, swim, or use a hot tub until your health care provider approves. You may shower 24 hours after the procedure or as directed by your health care provider. °· There are many different ways to close and cover an incision, including stitches, skin glue, and adhesive strips. Follow your health care provider's instructions on: °¨ Incision care. °¨ Bandage (dressing) changes and removal. °¨ Incision closure removal. °· Monitor your dialysis fistula carefully. °SEEK MEDICAL CARE IF: °· You have drainage, redness, swelling, or pain at your catheter site. °· You have a fever. °· You have chills. °SEEK IMMEDIATE MEDICAL CARE IF: °· You feel weak. °· You have trouble balancing. °· You have trouble moving your arms or legs. °· You have problems with your speech or vision. °· You can no longer feel a vibration or buzz when you put your fingers over your dialysis fistula. °· The limb that was used for the procedure: °¨ Swells. °¨ Is painful. °¨ Is cold. °¨ Is discolored, such as blue or pale white. °  °This information is not intended  to replace advice given to you by your health care provider. Make sure you discuss any questions you have with your health care provider. °  °Document Released: 11/25/2013 Document Reviewed: 11/25/2013 °Elsevier Interactive Patient Education ©2016 [employer] Inc[employer] ° °

## 2015-08-10 NOTE — Progress Notes (Signed)
Pt clinically stable post procedure, husband at bedside, Dr Lucky Cowboy out to speak with patient and family, questions answered, taking po's without difficulty, vss. No bleeding at site. Return appt. Given with discharge instructions.

## 2015-08-10 NOTE — Op Note (Signed)
[facility]    OPERATIVE NOTE   PROCEDURE: 1.  Left arm arteriovenous graft cannulation under ultrasound guidance 2.  Left arm shuntogram 3.  Percutaneous transluminal angioplasty of arterial access site/mid graft with 7 mm diameter by 6 cm length high pressure angioplasty balloon  PRE-OPERATIVE DIAGNOSIS: 1. ESRD 2. 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 High-grade stenosis in proximal to midportion of AV graft near the arterial access site of greater than 80%. Improved with angioplasty  SPECIMEN(S):  None  CONTRAST: 25 cc  FLUORO TIME: 0.6 minutes  MODERATE CONSCIOUS SEDATION TIME:  Approximately 30 minutes using 2 mg of Versed and 50 mcg of Fentanyl  INDICATIONS: Cynthia Dean is a 65 y.o. female who presents with malfunctioning left radial artery to axillary vein arteriovenous graft.  The patient is scheduled for  left arm shuntogram.  The patient is aware the risks include but are not limited to: bleeding, infection, thrombosis of the cannulated access, and possible anaphylactic reaction to the contrast.  The patient is aware of the risks of the procedure and elects to proceed forward.  DESCRIPTION: After full informed written consent was obtained, the patient was brought back to the angiography suite and placed supine upon the angiography table.  The patient was connected to monitoring equipment. Moderate conscious sedation was administered throughout the procedure with my supervision of the RN administering medicines and monitoring the patient's vital signs, pulse oximetry, telemetry and mental status throughout from the start of the procedure until the patient was taken to the recovery room The  left arm was prepped and draped in the standard fashion for a percutaneous  access intervention.  Under ultrasound guidance, the mid to distal portion of the arteriovenous graft was cannulated with a micropuncture needle under direct ultrasound guidance in a retrograde fashion based off our office duplex findings and a permanent image was performed.  The microwire was advanced into the fistula and the needle was exchanged for the a microsheath.  I then upsized to a 6 Fr Sheath and imaging was performed.  Hand injections were completed to image the access including the central venous system. This demonstrated a high-grade stenosis of about 85% in the proximal to mid graft near the arterial access site. The venous anastomosis appeared patent and the central venous circulation appeared widely patent  Based on the images, this patient will need intervention to this area to salvage the graft. I then gave the patient 4000 units of intravenous heparin.  I then crossed the stenosis with a Magic Tourqe wire.  Based on the imaging, a 7 mm x 6 cm  high pressure angioplasty balloon was selected.  The balloon was centered around the stenosis and inflated to 12 ATM for 1 minute(s).  On completion imaging, a 5-10 % residual stenosis was present.     Based on the completion imaging, no further intervention is necessary.  The wire and balloon were removed from the sheath.  A 4-0 Monocryl purse-string suture was sewn around the sheath.  The sheath was removed while tying down the suture.  A sterile bandage was applied to the puncture site.  COMPLICATIONS: None  CONDITION: Stable   [name]  08/10/2015 2:38 PM

## 2015-08-10 NOTE — H&P (Signed)
  [facility] History & Physical Update  The patient was interviewed and re-examined.  The patient's previous History and Physical has been reviewed and is unchanged. Fistula is functioning poorly, and we are asked to assess this with fistulagram. There is no change in the plan of care. We plan to proceed with the scheduled procedure.  DEW,JASON, MD  08/10/2015, 12:28 PM

## 2015-08-11 ENCOUNTER — Encounter: Payer: Self-pay | Admitting: Vascular Surgery

## 2015-09-16 ENCOUNTER — Other Ambulatory Visit: Payer: Self-pay | Admitting: Pediatrics

## 2015-09-16 ENCOUNTER — Ambulatory Visit: Payer: Medicare HMO

## 2015-09-16 ENCOUNTER — Ambulatory Visit: Admission: RE | Admit: 2015-09-16 | Payer: Medicare HMO | Source: Ambulatory Visit

## 2015-09-16 DIAGNOSIS — R404 Transient alteration of awareness: Secondary | ICD-10-CM

## 2015-09-22 ENCOUNTER — Ambulatory Visit: Payer: Medicare HMO

## 2015-09-23 ENCOUNTER — Ambulatory Visit
Admission: RE | Admit: 2015-09-23 | Discharge: 2015-09-23 | Disposition: A | Discharge status: Home / Self Care | Payer: Medicare HMO | Source: Ambulatory Visit | Attending: Pediatrics | Admitting: Pediatrics

## 2015-09-23 DIAGNOSIS — G319 Degenerative disease of nervous system, unspecified: Secondary | ICD-10-CM | POA: Diagnosis not present

## 2015-09-23 DIAGNOSIS — I6523 Occlusion and stenosis of bilateral carotid arteries: Secondary | ICD-10-CM | POA: Diagnosis not present

## 2015-09-23 DIAGNOSIS — R4182 Altered mental status, unspecified: Secondary | ICD-10-CM | POA: Insufficient documentation

## 2015-09-23 DIAGNOSIS — I63549 Cerebral infarction due to unspecified occlusion or stenosis of unspecified cerebellar artery: Secondary | ICD-10-CM | POA: Insufficient documentation

## 2015-09-23 DIAGNOSIS — I639 Cerebral infarction, unspecified: Secondary | ICD-10-CM | POA: Diagnosis not present

## 2015-09-23 DIAGNOSIS — R404 Transient alteration of awareness: Secondary | ICD-10-CM

## 2015-09-23 DIAGNOSIS — R9082 White matter disease, unspecified: Secondary | ICD-10-CM | POA: Diagnosis not present

## 2015-09-23 DIAGNOSIS — G9389 Other specified disorders of brain: Secondary | ICD-10-CM | POA: Diagnosis not present

## 2015-10-07 ENCOUNTER — Ambulatory Visit: Payer: Medicare HMO | Admitting: Pain Medicine

## 2015-11-05 DIAGNOSIS — IMO0002 Reserved for concepts with insufficient information to code with codable children: Secondary | ICD-10-CM | POA: Insufficient documentation

## 2015-11-26 ENCOUNTER — Encounter: Payer: Self-pay | Admitting: Cardiology

## 2015-11-26 ENCOUNTER — Ambulatory Visit (INDEPENDENT_AMBULATORY_CARE_PROVIDER_SITE_OTHER): Payer: Medicare HMO | Admitting: Cardiology

## 2015-11-26 VITALS — BP 134/62 | HR 88 | Ht 67.0 in | Wt 310.0 lb

## 2015-11-26 DIAGNOSIS — R9431 Abnormal electrocardiogram [ECG] [EKG]: Secondary | ICD-10-CM | POA: Insufficient documentation

## 2015-11-26 DIAGNOSIS — I10 Essential (primary) hypertension: Secondary | ICD-10-CM

## 2015-11-26 DIAGNOSIS — R0602 Shortness of breath: Secondary | ICD-10-CM

## 2015-11-26 DIAGNOSIS — E785 Hyperlipidemia, unspecified: Secondary | ICD-10-CM

## 2015-11-26 DIAGNOSIS — I4581 Long QT syndrome: Secondary | ICD-10-CM

## 2015-11-26 DIAGNOSIS — Z01818 Encounter for other preprocedural examination: Secondary | ICD-10-CM

## 2015-11-26 DIAGNOSIS — E669 Obesity, unspecified: Secondary | ICD-10-CM

## 2015-11-26 NOTE — Patient Instructions (Addendum)
Medication Instructions:  Please talk with your physician about stopping the Elavil because of prolonged QT interval.    Labwork: None ordered  Testing/Procedures: Your physician has requested that you have an echocardiogram. Echocardiography is a painless test that uses sound waves to create images of your heart. It provides your doctor with information about the size and shape of your heart and how well your heart's chambers and valves are working. This procedure takes approximately one hour. There are no restrictions for this procedure.  Date & Time: ____Tuesday Dec 08, 2015 at 11:00 AM ARRIVE AT [facility] Entrance 10:30AM to register. Please call if you can not keep this appointment.   [place]  Your caregiver has ordered a Stress Test with nuclear imaging. The purpose of this test is to evaluate the blood supply to your heart muscle. This procedure is referred to as a "Non-Invasive Stress Test." This is because other than having an IV started in your vein, nothing is inserted or "invades" your body. Cardiac stress tests are done to find areas of poor blood flow to the heart by determining the extent of coronary artery disease (CAD). Some patients exercise on a treadmill, which naturally increases the blood flow to your heart, while others who are  unable to walk on a treadmill due to physical limitations have a pharmacologic/chemical stress agent called Lexiscan . This medicine will mimic walking on a treadmill by temporarily increasing your coronary blood flow.   Please note: these test may take anywhere between 2-4 hours to complete  PLEASE REPORT TO [facility] AT THE FIRST DESK WILL DIRECT YOU WHERE TO GO  Date of Procedure:___Wednesday Dec 09, 2015 at 10:00AM and Thursday Dec 10, 2015 at 10:00AM_______  Arrival Time for Procedure:___Arrive at 09:45AM to register______  Instructions regarding medication:   __X__ : Hold diabetes medication  morning of procedure  __X__:  Hold Metoprolol or (Toprol) betablocker the night before procedure and morning of procedure   PLEASE NOTIFY THE OFFICE AT LEAST 24 HOURS IN ADVANCE IF YOU ARE UNABLE TO KEEP YOUR APPOINTMENT.  [phone] AND  PLEASE NOTIFY NUCLEAR MEDICINE AT [facility] AT LEAST 24 HOURS IN ADVANCE IF YOU ARE UNABLE TO KEEP YOUR APPOINTMENT. [phone]  How to prepare for your Myoview test:   Do not eat or drink after midnight  No caffeine for 24 hours prior to test  No smoking 24 hours prior to test.  Your medication may be taken with water.  If your doctor stopped a medication because of this test, do not take that medication.  Ladies, please do not wear dresses.  Skirts or pants are appropriate. Please wear a short sleeve shirt.  No perfume, cologne or lotion.  Wear comfortable walking shoes. No heels!      Follow-Up: Your physician recommends that you schedule a follow-up appointment after testing to review results with Dr. Yvone Neu.  Date & Time: _______________________________________________________________________   Any Other Special Instructions Will Be Listed Below (If Applicable).     If you need a refill on your cardiac medications before your next appointment, please call your pharmacy.  Echocardiogram An echocardiogram, or echocardiography, uses sound waves (ultrasound) to produce an image of your heart. The echocardiogram is simple, painless, obtained within a short period of time, and offers valuable information to your health care provider. The images from an echocardiogram can provide information such as:  Evidence of coronary artery disease (CAD).  Heart size.  Heart muscle function.  Heart valve function.  Aneurysm detection.  Evidence of a past heart attack.  Fluid buildup around the heart.  Heart muscle thickening.  Assess heart valve function. LET [facility] CARE PROVIDER KNOW ABOUT:  Any allergies you have.  All  medicines you are taking, including vitamins, herbs, eye drops, creams, and over-the-counter medicines.  Previous problems you or members of your family have had with the use of anesthetics.  Any blood disorders you have.  Previous surgeries you have had.  Medical conditions you have.  Possibility of pregnancy, if this applies. BEFORE THE PROCEDURE  No special preparation is needed. Eat and drink normally.  PROCEDURE   In order to produce an image of your heart, gel will be applied to your chest and a wand-like tool (transducer) will be moved over your chest. The gel will help transmit the sound waves from the transducer. The sound waves will harmlessly bounce off your heart to allow the heart images to be captured in real-time motion. These images will then be recorded.  You may need an IV to receive a medicine that improves the quality of the pictures. AFTER THE PROCEDURE You may return to your normal schedule including diet, activities, and medicines, unless your health care provider tells you otherwise.   This information is not intended to replace advice given to you by your health care provider. Make sure you discuss any questions you have with your health care provider.   Document Released: 07/08/2000 Document Revised: 08/01/2014 Document Reviewed: 03/18/2013 Elsevier Interactive Patient Education 2016 [place].   Pharmacologic Stress Electrocardiogram A pharmacologic stress electrocardiogram is a heart (cardiac) test that uses nuclear imaging to evaluate the blood supply to your heart. This test may also be called a pharmacologic stress electrocardiography. Pharmacologic means that a medicine is used to increase your heart rate and blood pressure.  This stress test is done to find areas of poor blood flow to the heart by determining the extent of coronary artery disease (CAD). Some people exercise on a treadmill, which naturally increases the blood flow to the heart. For  those people unable to exercise on a treadmill, a medicine is used. This medicine stimulates your heart and will cause your heart to beat harder and more quickly, as if you were exercising.  Pharmacologic stress tests can help determine:  The adequacy of blood flow to your heart during increased levels of activity in order to clear you for discharge home.  The extent of coronary artery blockage caused by CAD.  Your prognosis if you have suffered a heart attack.  The effectiveness of cardiac procedures done, such as an angioplasty, which can increase the circulation in your coronary arteries.  Causes of chest pain or pressure. LET [facility] CARE PROVIDER KNOW ABOUT:  Any allergies you have.  All medicines you are taking, including vitamins, herbs, eye drops, creams, and over-the-counter medicines.  Previous problems you or members of your family have had with the use of anesthetics.  Any blood disorders you have.  Previous surgeries you have had.  Medical conditions you have.  Possibility of pregnancy, if this applies.  If you are currently breastfeeding. RISKS AND COMPLICATIONS Generally, this is a safe procedure. However, as with any procedure, complications can occur. Possible complications include:  You develop pain or pressure in the following areas:  Chest.  Jaw or neck.  Between your shoulder blades.  Radiating down your left arm.  Headache.  Dizziness or light-headedness.  Shortness of breath.  Increased  or irregular heartbeat.  Low blood pressure.  Nausea or vomiting.  Flushing.  Redness going up the arm and slight pain during injection of medicine.  Heart attack (rare). BEFORE THE PROCEDURE   Avoid all forms of caffeine for 24 hours before your test or as directed by your health care provider. This includes coffee, tea (even decaffeinated tea), caffeinated sodas, chocolate, cocoa, and certain pain medicines.  Follow your health care provider's  instructions regarding eating and drinking before the test.  Take your medicines as directed at regular times with water unless instructed otherwise. Exceptions may include:  If you have diabetes, ask how you are to take your insulin or pills. It is common to adjust insulin dosing the morning of the test.  If you are taking beta-blocker medicines, it is important to talk to your health care provider about these medicines well before the date of your test. Taking beta-blocker medicines may interfere with the test. In some cases, these medicines need to be changed or stopped 24 hours or more before the test.  If you wear a nitroglycerin patch, it may need to be removed prior to the test. Ask your health care provider if the patch should be removed before the test.  If you use an inhaler for any breathing condition, bring it with you to the test.  If you are an outpatient, bring a snack so you can eat right after the stress phase of the test.  Do not smoke for 4 hours prior to the test or as directed by your health care provider.  Do not apply lotions, powders, creams, or oils on your chest prior to the test.  Wear comfortable shoes and clothing. Let your health care provider know if you were unable to complete or follow the preparations for your test. PROCEDURE   Multiple patches (electrodes) will be put on your chest. If needed, small areas of your chest may be shaved to get better contact with the electrodes. Once the electrodes are attached to your body, multiple wires will be attached to the electrodes, and your heart rate will be monitored.  An IV access will be started. A nuclear trace (isotope) is given. The isotope may be given intravenously, or it may be swallowed. Nuclear refers to several types of radioactive isotopes, and the nuclear isotope lights up the arteries so that the nuclear images are clear. The isotope is absorbed by your body. This results in low radiation exposure.  A  resting nuclear image is taken to show how your heart functions at rest.  A medicine is given through the IV access.  A second scan is done about 1 hour after the medicine injection and determines how your heart functions under stress.  During this stress phase, you will be connected to an electrocardiogram machine. Your blood pressure and oxygen levels will be monitored. AFTER THE PROCEDURE   Your heart rate and blood pressure will be monitored after the test.  You may return to your normal schedule, including diet,activities, and medicines, unless your health care provider tells you otherwise.   This information is not intended to replace advice given to you by your health care provider. Make sure you discuss any questions you have with your health care provider.   Document Released: 11/27/2008 Document Revised: 07/16/2013 Document Reviewed: 03/18/2013 Elsevier Interactive Patient Education [employer].

## 2015-11-26 NOTE — Progress Notes (Signed)
Cardiology Office Note   Date:  11/26/2015   ID:  Cynthia Dean, DOB 1951-03-03, MRN [id number]  Referring Doctor:  Lowella Bandy, MD   Cardiologist:   Wende Bushy, MD   Reason for consultation:  Chief Complaint  Patient presents with  . other    Cardiac clearance neck surgery. Meds reviewed verbally.      History of Present Illness: Cynthia Dean is a 65 y.o. female who presents for preoperative cardiac evaluation prior to spine surgery.  Patient has very limited functional capacity in terms of mobility. She gets short of breath easily with short distances. This has been going on for quite some time now. Symptoms mainly in the chest, nonradiating, moderate in intensity, lasting minutes at a time, resolved with rest.  No complaints of chest pain, lightheadedness, dizziness, syncope. No PND, orthopnea, edema. No fever, cough, colds, no abdominal pain.   ROS:  Please see the history of present illness. Aside from mentioned under HPI, all other systems are reviewed and negative.     Past Medical History  Diagnosis Date  . Chronic kidney disease   . Coronary artery disease   . Diabetes mellitus without complication ([place])   . COPD (chronic obstructive pulmonary disease) ([place])   . Hypertension   . Heart murmur   . Hyperlipidemia     Past Surgical History  Procedure Laterality Date  . Peripheral vascular catheterization Left 02/02/2015    Procedure: A/V Shuntogram/Fistulagram;  Surgeon: Algernon Huxley, MD;  Location: [place] [facility];  Service: Cardiovascular;  Laterality: Left;  . Peripheral vascular catheterization Left 02/02/2015    Procedure: A/V Shunt Intervention;  Surgeon: Algernon Huxley, MD;  Location: [place] [facility];  Service: Cardiovascular;  Laterality: Left;  . Breast biopsy Bilateral 07/19/00    neg  . Peripheral vascular catheterization Left 03/09/2015    Procedure: A/V Shuntogram/Fistulagram;  Surgeon: Algernon Huxley, MD;  Location:  [place] [facility];  Service: Cardiovascular;  Laterality: Left;  . Peripheral vascular catheterization N/A 03/09/2015    Procedure: A/V Shunt Intervention;  Surgeon: Algernon Huxley, MD;  Location: [place] [facility];  Service: Cardiovascular;  Laterality: N/A;  . Peripheral vascular catheterization Left 04/17/2015    Procedure: Upper Extremity Angiography;  Surgeon: Algernon Huxley, MD;  Location: [place] [facility];  Service: Cardiovascular;  Laterality: Left;  . Peripheral vascular catheterization  04/17/2015    Procedure: Upper Extremity Intervention;  Surgeon: Algernon Huxley, MD;  Location: [place] [facility];  Service: Cardiovascular;;  . Peripheral vascular catheterization N/A 08/10/2015    Procedure: A/V Shuntogram/Fistulagram;  Surgeon: Algernon Huxley, MD;  Location: [place] [facility];  Service: Cardiovascular;  Laterality: N/A;  . Peripheral vascular catheterization N/A 08/10/2015    Procedure: A/V Shunt Intervention;  Surgeon: Algernon Huxley, MD;  Location: [place] [facility];  Service: Cardiovascular;  Laterality: N/A;     reports that she has quit smoking. Her smoking use included Cigarettes. She smoked 0.00 packs per day. She does not have any smokeless tobacco history on file. She reports that she does not drink alcohol or use illicit drugs.   family history includes Breast cancer (age of onset: 48) in her cousin; Breast cancer (age of onset: 26) in her father; Heart Problems in her sister; Heart attack in her mother; Stroke in her mother.   Current Outpatient Prescriptions  Medication Sig Dispense Refill  . amitriptyline (ELAVIL) 25 MG tablet Take  25 mg by mouth at bedtime.    [name] aspirin 81 MG tablet Take 81 mg by mouth daily.    . cinacalcet (SENSIPAR) 60 MG tablet Take 60 mg by mouth daily.    . diphenhydrAMINE (SOMINEX) 25 MG tablet Take 25 mg by mouth at bedtime.    . ferrous sulfate 325 (65 FE) MG EC tablet Take 325 mg by mouth daily with breakfast.    . fluconazole (DIFLUCAN) 100  MG tablet Take 50 mg by mouth daily.     . insulin NPH-regular Human (NOVOLIN 70/30) (70-30) 100 UNIT/ML injection Inject 44 Units into the skin 2 (two) times daily with a meal.     . ipratropium-albuterol (DUONEB) 0.5-2.5 (3) MG/3ML SOLN Take 3 mLs by nebulization every 6 (six) hours as needed (shortness of breath/ wheezing).     . metoprolol succinate (TOPROL-XL) 100 MG 24 hr tablet Take 100 mg by mouth daily. Take with or immediately following a meal.    . omeprazole (PRILOSEC) 20 MG capsule Take 20 mg by mouth daily.    . sevelamer carbonate (RENVELA) 800 MG tablet Take 800 mg by mouth 3 (three) times daily with meals.    . valsartan (DIOVAN) 160 MG tablet Take 160 mg by mouth daily.    . Vitamin D, Ergocalciferol, (DRISDOL) 50000 units CAPS capsule Take 50,000 Units by mouth every 7 (seven) days.     No current facility-administered medications for this visit.    Allergies: Review of patient's allergies indicates no known allergies.    PHYSICAL EXAM: VS:  BP 134/62 mmHg  Pulse 88  Ht 5\' 7"  (1.702 m)  Wt 310 lb (140.615 kg)  BMI 48.54 kg/m2 , Body mass index is 48.54 kg/(m^2). Wt Readings from Last 3 Encounters:  11/26/15 310 lb (140.615 kg)  08/10/15 310 lb (140.615 kg)  08/09/15 310 lb 6.5 oz (140.8 kg)    GENERAL:  well developed, well nourished, obese, not in acute distress HEENT: normocephalic, pink conjunctivae, anicteric sclerae, no xanthelasma, normal dentition, oropharynx clear NECK:  no neck vein engorgement, JVP normal, no hepatojugular reflux, carotid upstroke brisk and symmetric, no bruit, no thyromegaly, no lymphadenopathy LUNGS:  good respiratory effort, clear to auscultation bilaterally CV:  PMI not displaced, no thrills, no lifts, S1 and S2 within normal limits, no palpable S3 or S4, no murmurs, no rubs, no gallops ABD:  Soft, nontender, nondistended, normoactive bowel sounds, no abdominal aortic bruit, no hepatomegaly, no splenomegaly MS: nontender back, no  kyphosis, no scoliosis, no joint deformities EXT:  2+ DP/PT pulses, no edema, no varicosities, no cyanosis, no clubbing SKIN: warm, nondiaphoretic, normal turgor, no ulcers NEUROPSYCH: alert, oriented to person, place, and time, sensory/motor grossly intact, normal mood, appropriate affect  Recent Labs: 08/08/2015: ALT 17 08/09/2015: BUN 41*; Creatinine, Ser 7.60*; Hemoglobin 12.3; Platelets 201; Potassium 4.6; Sodium 132*   Lipid Panel    Component Value Date/Time   CHOL 78 12/25/2013 0413   TRIG 53 12/25/2013 0413   HDL 31* 12/25/2013 0413   VLDL 11 12/25/2013 0413   LDLCALC 36 12/25/2013 0413     Other studies Reviewed:  EKG:  The ekg from 11/26/2015 was personally reviewed by me and it revealed sinus rhythm, 88 BPM. PACs. Nonspecific ST abnormality. Prolonged QT.  Additional studies/ records that were reviewed personally reviewed by me today include: None available   ASSESSMENT AND PLAN:  Preoperative cardiac evaluation Shortness of breath Patient has multiple risk factors for CAD including age, postmenopausal state, diabetes, hypertension,  hyperlipidemia, morbid obesity. Recommend pharmacologic nuclear stress test for further evaluation and risk stratification. Recommend echocardiogram.  Abnormal EKG Nonspecific ST-T wave changes Prolonged QT Stress test is ordered as above. I had a lengthy discussion with the patient regarding her medication list. Amitriptyline, diphenhydramine, and fluconazole are listed down. These have been cross checked with list of medications that are known to prolong QT. She was thoroughly advised to contact her PCP right away about amitriptyline discontinuation or switch to something else. She says she has not been on diphenhydramine and fluconazole for quite some time now. We will be forwarding this note to referring physician's office. She has no history of syncope.  Hypertension BP is well controlled. Continue monitoring BP. Continue current  medical therapy and lifestyle changes.  Hyperlipidemia Likely PCP following labs. LDL recommended goal is less than 70 due to history of diabetes.  Obesity Body mass index is 48.54 kg/(m^2).[name] Recommend aggressive weight loss through diet and increased physical activity. This is once cardiac workup has been done.  Current medicines are reviewed at length with the patient today.  The patient does not have concerns regarding medicines.  Labs/ tests ordered today include:  Orders Placed This Encounter  Procedures  . NM Myocar Multi W/Spect W/Wall Motion / EF  . EKG 12-Lead  . ECHOCARDIOGRAM COMPLETE    I had a lengthy and detailed discussion with the patient regarding diagnoses, prognosis, diagnostic options, treatment options , and side effects of medications.   I counseled the patient on importance of lifestyle modification including heart healthy diet, regular physical activity , once cardiac workup was done   Disposition:   FU with undersigned after tests   I spent at least 60 minutes with the patient today and more than 50% of the time was spent counseling the patient and coordinating care.     Signed, Wende Bushy, MD  11/26/2015 11:51 PM    [place]

## 2015-11-27 DIAGNOSIS — I10 Essential (primary) hypertension: Secondary | ICD-10-CM | POA: Insufficient documentation

## 2015-11-27 DIAGNOSIS — E669 Obesity, unspecified: Secondary | ICD-10-CM | POA: Insufficient documentation

## 2015-11-27 DIAGNOSIS — E785 Hyperlipidemia, unspecified: Secondary | ICD-10-CM | POA: Insufficient documentation

## 2015-12-08 ENCOUNTER — Telehealth: Payer: Self-pay | Admitting: Cardiology

## 2015-12-08 ENCOUNTER — Ambulatory Visit
Admission: RE | Admit: 2015-12-08 | Discharge: 2015-12-08 | Disposition: A | Discharge status: Home / Self Care | Payer: Medicare HMO | Source: Ambulatory Visit | Attending: Cardiology | Admitting: Cardiology

## 2015-12-08 DIAGNOSIS — Z01818 Encounter for other preprocedural examination: Secondary | ICD-10-CM | POA: Diagnosis not present

## 2015-12-08 DIAGNOSIS — R0602 Shortness of breath: Secondary | ICD-10-CM | POA: Diagnosis not present

## 2015-12-08 NOTE — Progress Notes (Signed)
*  PRELIMINARY RESULTS* Echocardiogram 2D Echocardiogram has been performed.  Laqueta Jean Hege 12/08/2015, 11:56 AM

## 2015-12-08 NOTE — Telephone Encounter (Signed)
Spoke with patient and reviewed instructions, time, location, for stress test the next 2 days. Patient verbalized understanding and had no further questions at this time.

## 2015-12-08 NOTE — Telephone Encounter (Signed)
Left voicemail message for patient to call back so we can review instructions for stress test the next couple of days.

## 2015-12-09 ENCOUNTER — Encounter
Admission: RE | Admit: 2015-12-09 | Discharge: 2015-12-09 | Disposition: A | Discharge status: Home / Self Care | Payer: Medicare HMO | Source: Ambulatory Visit | Attending: Cardiology | Admitting: Cardiology

## 2015-12-09 DIAGNOSIS — Z01818 Encounter for other preprocedural examination: Secondary | ICD-10-CM

## 2015-12-09 DIAGNOSIS — R0602 Shortness of breath: Secondary | ICD-10-CM | POA: Insufficient documentation

## 2015-12-09 MED ORDER — REGADENOSON 0.4 MG/5ML IV SOLN
0.4000 mg | Freq: Once | INTRAVENOUS | Status: AC
  Administered 2015-12-09: 0.4 mg via INTRAVENOUS

## 2015-12-09 MED ORDER — TECHNETIUM TC 99M TETROFOSMIN IV KIT
32.8700 | PACK | Freq: Once | INTRAVENOUS | Status: AC | PRN
  Administered 2015-12-09: 32.87 via INTRAVENOUS

## 2015-12-10 ENCOUNTER — Encounter
Admission: RE | Admit: 2015-12-10 | Discharge: 2015-12-10 | Disposition: A | Discharge status: Home / Self Care | Payer: Medicare HMO | Source: Ambulatory Visit | Attending: Cardiology | Admitting: Cardiology

## 2015-12-10 LAB — NM MYOCAR MULTI W/SPECT W/WALL MOTION / EF
CHL CUP NUCLEAR SRS: 8
CHL CUP NUCLEAR SSS: 6
CSEPHR: 60 %
CSEPPHR: 94 {beats}/min
LV dias vol: 116 mL (ref 46–106)
LVSYSVOL: 54 mL
Rest HR: 77 {beats}/min
SDS: 1
TID: 1.12

## 2015-12-10 MED ORDER — TECHNETIUM TC 99M TETROFOSMIN IV KIT
28.5030 | PACK | Freq: Once | INTRAVENOUS | Status: AC | PRN
  Administered 2015-12-10: 28.503 via INTRAVENOUS

## 2015-12-10 MED ORDER — TECHNETIUM TC 99M TETROFOSMIN IV KIT: 32.8700 | PACK | Freq: Once | INTRAVENOUS | Status: DC | PRN

## 2015-12-11 ENCOUNTER — Ambulatory Visit: Payer: Medicare HMO

## 2015-12-22 ENCOUNTER — Ambulatory Visit: Payer: Medicare HMO | Admitting: Cardiology

## 2016-01-01 ENCOUNTER — Ambulatory Visit: Payer: Medicare HMO | Admitting: Cardiology

## 2016-01-04 ENCOUNTER — Ambulatory Visit (INDEPENDENT_AMBULATORY_CARE_PROVIDER_SITE_OTHER): Payer: Medicare HMO | Admitting: Cardiology

## 2016-01-04 ENCOUNTER — Encounter: Payer: Self-pay | Admitting: Cardiology

## 2016-01-04 VITALS — BP 170/75 | HR 80 | Ht 67.0 in | Wt 298.5 lb

## 2016-01-04 DIAGNOSIS — R0602 Shortness of breath: Secondary | ICD-10-CM

## 2016-01-04 DIAGNOSIS — E785 Hyperlipidemia, unspecified: Secondary | ICD-10-CM

## 2016-01-04 DIAGNOSIS — I10 Essential (primary) hypertension: Secondary | ICD-10-CM | POA: Diagnosis not present

## 2016-01-04 DIAGNOSIS — R9431 Abnormal electrocardiogram [ECG] [EKG]: Secondary | ICD-10-CM

## 2016-01-04 DIAGNOSIS — I4581 Long QT syndrome: Secondary | ICD-10-CM | POA: Diagnosis not present

## 2016-01-04 DIAGNOSIS — E669 Obesity, unspecified: Secondary | ICD-10-CM

## 2016-01-04 DIAGNOSIS — Z01818 Encounter for other preprocedural examination: Secondary | ICD-10-CM | POA: Diagnosis not present

## 2016-01-04 NOTE — Patient Instructions (Addendum)
Medication Instructions:  Your physician recommends that you continue on your current medications as directed. Please refer to the Current Medication list given to you today.   Labwork: None ordered  Testing/Procedures: None orderded  Follow-Up: Your physician recommends that you schedule a follow-up appointment as needed.    Any Other Special Instructions Will Be Listed Below (If Applicable).     If you need a refill on your cardiac medications before your next appointment, please call your pharmacy.

## 2016-01-04 NOTE — Addendum Note (Signed)
Addended by: Valora Corporal on: 01/04/2016 01:03 PM   Modules accepted: Orders

## 2016-01-04 NOTE — Progress Notes (Signed)
Cardiology Office Note   Date:  01/04/2016   ID:  [name], DOB [birth year], MRN [id number]  Referring Doctor:  Lowella Bandy, MD   Cardiologist:   Wende Bushy, MD   Reason for consultation:  Chief Complaint  Patient presents with  . other    F/u stress test and echo c/o elevated BP . Pt needs cardiac clearance Dr. Sherlyn Lick . Meds reviewed verbally.      History of Present Illness: Cynthia Dean is a 65 y.o. female who presents for Follow-up after testing   Patient has very limited functional capacity in terms of mobility. Shortness of breath is the same as before. No new symptoms or progression of shortness of breath.   No complaints of chest pain, lightheadedness, dizziness, syncope. No PND, orthopnea, edema. No fever, cough, colds, no abdominal pain.   ROS:  Please see the history of present illness. Aside from mentioned under HPI, all other systems are reviewed and negative.     Past Medical History  Diagnosis Date  . Chronic kidney disease   . Coronary artery disease   . Diabetes mellitus without complication ([place])   . COPD (chronic obstructive pulmonary disease) ([place])   . Hypertension   . Heart murmur   . Hyperlipidemia     Past Surgical History  Procedure Laterality Date  . Peripheral vascular catheterization Left 02/02/2015    Procedure: A/V Shuntogram/Fistulagram;  Surgeon: Algernon Huxley, MD;  Location: [facility];  Service: Cardiovascular;  Laterality: Left;  . Peripheral vascular catheterization Left 02/02/2015    Procedure: A/V Shunt Intervention;  Surgeon: Algernon Huxley, MD;  Location: [facility];  Service: Cardiovascular;  Laterality: Left;  . Breast biopsy Bilateral 07/19/00    neg  . Peripheral vascular catheterization Left 03/09/2015    Procedure: A/V Shuntogram/Fistulagram;  Surgeon: Algernon Huxley, MD;  Location: [facility];  Service: Cardiovascular;  Laterality: Left;  . Peripheral vascular  catheterization N/A 03/09/2015    Procedure: A/V Shunt Intervention;  Surgeon: Algernon Huxley, MD;  Location: [facility];  Service: Cardiovascular;  Laterality: N/A;  . Peripheral vascular catheterization Left 04/17/2015    Procedure: Upper Extremity Angiography;  Surgeon: Algernon Huxley, MD;  Location: [facility];  Service: Cardiovascular;  Laterality: Left;  . Peripheral vascular catheterization  04/17/2015    Procedure: Upper Extremity Intervention;  Surgeon: Algernon Huxley, MD;  Location: [facility];  Service: Cardiovascular;;  . Peripheral vascular catheterization N/A 08/10/2015    Procedure: A/V Shuntogram/Fistulagram;  Surgeon: Algernon Huxley, MD;  Location: [facility];  Service: Cardiovascular;  Laterality: N/A;  . Peripheral vascular catheterization N/A 08/10/2015    Procedure: A/V Shunt Intervention;  Surgeon: Algernon Huxley, MD;  Location: [facility];  Service: Cardiovascular;  Laterality: N/A;     reports that she has quit smoking. Her smoking use included Cigarettes. She smoked 0.00 packs per day. She does not have any smokeless tobacco history on file. She reports that she does not drink alcohol or use illicit drugs.   family history includes Breast cancer (age of onset: 4) in her cousin; Breast cancer (age of onset: 59) in her father; Heart Problems in her sister; Heart attack in her mother; Stroke in her mother.   Current Outpatient Prescriptions  Medication Sig Dispense Refill  . aspirin 81 MG tablet Take 81 mg by mouth daily.    . cinacalcet (  SENSIPAR) 60 MG tablet Take 60 mg by mouth daily.    . insulin NPH-regular Human (NOVOLIN 70/30) (70-30) 100 UNIT/ML injection Inject 44 Units into the skin 2 (two) times daily with a meal.     . ipratropium-albuterol (DUONEB) 0.5-2.5 (3) MG/3ML SOLN Take 3 mLs by nebulization every 6 (six) hours as needed (shortness of breath/ wheezing).     . metoprolol succinate (TOPROL-XL) 50 MG 24 hr tablet Take 50 mg  by mouth daily. Take with or immediately following a meal.    . omeprazole (PRILOSEC) 20 MG capsule Take 20 mg by mouth daily.    . sevelamer carbonate (RENVELA) 800 MG tablet Take 800 mg by mouth 3 (three) times daily with meals.    . valsartan (DIOVAN) 160 MG tablet Take 160 mg by mouth daily.     No current facility-administered medications for this visit.    Allergies: Review of patient's allergies indicates no known allergies.    PHYSICAL EXAM: VS:  BP 170/75 mmHg  Pulse 80  Ht 5\' 7"  (1.702 m)  Wt 298 lb 8 oz (135.399 kg)  BMI 46.74 kg/m2 , Body mass index is 46.74 kg/(m^2). Wt Readings from Last 3 Encounters:  01/04/16 298 lb 8 oz (135.399 kg)  11/26/15 310 lb (140.615 kg)  08/10/15 310 lb (140.615 kg)    GENERAL:  well developed, well nourished, obese, not in acute distress HEENT: normocephalic, pink conjunctivae, anicteric sclerae, no xanthelasma, normal dentition, oropharynx clear NECK:  no neck vein engorgement, JVP normal, no hepatojugular reflux, carotid upstroke brisk and symmetric, no bruit, no thyromegaly, no lymphadenopathy LUNGS:  good respiratory effort, clear to auscultation bilaterally CV:  PMI not displaced, no thrills, no lifts, S1 and S2 within normal limits, no palpable S3 or S4, no murmurs, no rubs, no gallops ABD:  Soft, nontender, nondistended, normoactive bowel sounds, no abdominal aortic bruit, no hepatomegaly, no splenomegaly MS: nontender back, no kyphosis, no scoliosis, no joint deformities EXT:  2+ DP/PT pulses, no edema, no varicosities, no cyanosis, no clubbing SKIN: warm, nondiaphoretic, normal turgor, no ulcers NEUROPSYCH: alert, oriented to person, place, and time, sensory/motor grossly intact, normal mood, appropriate affect  Recent Labs: 08/08/2015: ALT 17 08/09/2015: BUN 41*; Creatinine, Ser 7.60*; Hemoglobin 12.3; Platelets 201; Potassium 4.6; Sodium 132*   Lipid Panel    Component Value Date/Time   CHOL 78 12/25/2013 0413   TRIG 53  12/25/2013 0413   HDL 31* 12/25/2013 0413   VLDL 11 12/25/2013 0413   LDLCALC 36 12/25/2013 0413     Other studies Reviewed:  EKG:  The ekg from 11/26/2015 was personally reviewed by me and it revealed sinus rhythm, 88 BPM. PACs. Nonspecific ST abnormality. Prolonged QT.  Additional studies/ records that were reviewed personally reviewed by me today include:  Echo 12/08/2015: Left ventricle: The cavity size was normal. Systolic function was  normal. The estimated ejection fraction was in the range of 60%  to 65%. Wall motion was normal; there were no regional wall  motion abnormalities. Left ventricular diastolic function  parameters were normal. - Mitral valve: There was mild regurgitation. - Right ventricle: Systolic function was normal. - Pulmonary arteries: Systolic pressure was within the normal  range.  Pharmacologic nuclear stress test 12/09/2015: Pharmacological myocardial perfusion imaging study with no significant Ischemia GI uptake artifact noted Normal wall motion, EF estimated at 57% No EKG changes concerning for ischemia at peak stress or in recovery. Low risk scan   ASSESSMENT AND PLAN:  Preoperative cardiac evaluation  Shortness of breath Patient has multiple risk factors for CAD including age, postmenopausal state, diabetes, hypertension, hyperlipidemia, morbid obesity. pharmacologic nuclear stress test did not reveal any ischemia. LVEF was normal. Normal stress is portends a low risk of clinically significant CAD. Patient's cardiac risk would be intermediate for moderate risk procedure. No indication for further cardiac testing at this point.   Abnormal EKG Nonspecific ST-T wave changes Prolonged QT EKG from 01/04/2016 was personally reviewed by me and it showed sinus rhythm, 79 BPM. QT was recalculated and measured at 400 ms. QTC was 433 ms. This is an improvement from previous EKG. Patient has since stopped amitriptyline, diphenhydramine, and  fluconazole. Recommend to avoid QT prolonging medications. She has no history of syncope.  Hypertension BP is much higher now. It appears that metoprolol is now down to 50 mg by mouth daily instead of 100 mg by mouth daily. Patient is not sure who decreased her dosage. Based on medical records and office notes, she is supposed to be on 100 mg by mouth daily. Patient will be verifying this with her nephrologist who prescribes her Toprol.  Continue monitoring BP. Continue current medical therapy and lifestyle changes.  Hyperlipidemia Likely PCP following labs. LDL recommended goal is less than 70 due to history of diabetes.  Obesity Body mass index is 46.74 kg/(m^2).[name] Recommend aggressive weight loss through diet and increased physical activity. This is once cardiac workup has been done.  Current medicines are reviewed at length with the patient today.  The patient does not have concerns regarding medicines.  Labs/ tests ordered today include:  No orders of the defined types were placed in this encounter.    I had a lengthy and detailed discussion with the patient regarding diagnoses, prognosis, diagnostic options, treatment options , and side effects of medications.   I counseled the patient on importance of lifestyle modification including heart healthy diet, regular physical activity.   Disposition:   FU with undersigned when necessary   Signed, Wende Bushy, MD  01/04/2016 12:00 PM    [place]

## 2016-01-12 ENCOUNTER — Telehealth: Payer: Self-pay | Admitting: Cardiology

## 2016-01-12 NOTE — Telephone Encounter (Signed)
Spoke with [facility] and they were needing cardiac clearance. Forwarded office visit note over for them and let them know to please call back if any further questions.

## 2016-01-12 NOTE — Telephone Encounter (Signed)
Dr Keturah Barre from Dialysis office  calling asking about Patient surgical clearance for spinal surgery   Fax [phone] Attn: Deanna  [facility]  Surgery date has not been scheduled waiting on [place] for this. Please send it in ASAP

## 2016-04-06 ENCOUNTER — Other Ambulatory Visit: Payer: Self-pay | Admitting: Vascular Surgery

## 2016-04-11 ENCOUNTER — Ambulatory Visit
Admission: RE | Admit: 2016-04-11 | Discharge: 2016-04-11 | Disposition: A | Discharge status: Home / Self Care | Payer: Medicare HMO | Source: Ambulatory Visit | Attending: Vascular Surgery | Admitting: Vascular Surgery

## 2016-04-11 ENCOUNTER — Encounter
Admission: RE | Disposition: A | Discharge status: Home / Self Care | Payer: Self-pay | Source: Ambulatory Visit | Attending: Vascular Surgery

## 2016-04-11 ENCOUNTER — Encounter: Payer: Self-pay | Admitting: *Deleted

## 2016-04-11 DIAGNOSIS — Z87891 Personal history of nicotine dependence: Secondary | ICD-10-CM | POA: Diagnosis not present

## 2016-04-11 DIAGNOSIS — E785 Hyperlipidemia, unspecified: Secondary | ICD-10-CM | POA: Diagnosis not present

## 2016-04-11 DIAGNOSIS — Z803 Family history of malignant neoplasm of breast: Secondary | ICD-10-CM | POA: Diagnosis not present

## 2016-04-11 DIAGNOSIS — E1122 Type 2 diabetes mellitus with diabetic chronic kidney disease: Secondary | ICD-10-CM | POA: Insufficient documentation

## 2016-04-11 DIAGNOSIS — N186 End stage renal disease: Secondary | ICD-10-CM | POA: Diagnosis not present

## 2016-04-11 DIAGNOSIS — Z8249 Family history of ischemic heart disease and other diseases of the circulatory system: Secondary | ICD-10-CM | POA: Diagnosis not present

## 2016-04-11 DIAGNOSIS — I120 Hypertensive chronic kidney disease with stage 5 chronic kidney disease or end stage renal disease: Secondary | ICD-10-CM | POA: Diagnosis not present

## 2016-04-11 DIAGNOSIS — Z823 Family history of stroke: Secondary | ICD-10-CM | POA: Diagnosis not present

## 2016-04-11 DIAGNOSIS — Z992 Dependence on renal dialysis: Secondary | ICD-10-CM | POA: Diagnosis not present

## 2016-04-11 DIAGNOSIS — T82858A Stenosis of vascular prosthetic devices, implants and grafts, initial encounter: Secondary | ICD-10-CM | POA: Diagnosis not present

## 2016-04-11 DIAGNOSIS — J449 Chronic obstructive pulmonary disease, unspecified: Secondary | ICD-10-CM | POA: Diagnosis not present

## 2016-04-11 DIAGNOSIS — Y838 Other surgical procedures as the cause of abnormal reaction of the patient, or of later complication, without mention of misadventure at the time of the procedure: Secondary | ICD-10-CM | POA: Diagnosis not present

## 2016-04-11 DIAGNOSIS — I2510 Atherosclerotic heart disease of native coronary artery without angina pectoris: Secondary | ICD-10-CM | POA: Insufficient documentation

## 2016-04-11 HISTORY — PX: PERIPHERAL VASCULAR CATHETERIZATION: SHX172C

## 2016-04-11 LAB — POTASSIUM (ARMC VASCULAR LAB ONLY): Potassium (ARMC vascular lab): 5.1 (ref 3.5–5.1)

## 2016-04-11 SURGERY — A/V SHUNTOGRAM/FISTULAGRAM
Anesthesia: Moderate Sedation | Site: Arm Upper | Laterality: Left

## 2016-04-11 MED ORDER — MIDAZOLAM HCL 5 MG/5ML IJ SOLN
INTRAMUSCULAR | Status: AC
  Filled 2016-04-11: qty 5

## 2016-04-11 MED ORDER — HEPARIN SODIUM (PORCINE) 1000 UNIT/ML IJ SOLN
INTRAMUSCULAR | Status: AC
  Filled 2016-04-11: qty 1

## 2016-04-11 MED ORDER — IOPAMIDOL (ISOVUE-300) INJECTION 61%
INTRAVENOUS | Status: DC | PRN
  Administered 2016-04-11: 25 mL via INTRAVENOUS

## 2016-04-11 MED ORDER — HEPARIN SODIUM (PORCINE) 1000 UNIT/ML IJ SOLN
INTRAMUSCULAR | Status: DC | PRN
  Administered 2016-04-11: 3000 [IU] via INTRAVENOUS

## 2016-04-11 MED ORDER — FENTANYL CITRATE (PF) 100 MCG/2ML IJ SOLN
INTRAMUSCULAR | Status: AC
  Filled 2016-04-11: qty 2

## 2016-04-11 MED ORDER — HYDROMORPHONE HCL 1 MG/ML IJ SOLN: 1.0000 mg | Freq: Once | INTRAMUSCULAR | Status: DC

## 2016-04-11 MED ORDER — FAMOTIDINE 20 MG PO TABS: 40.0000 mg | ORAL_TABLET | ORAL | Status: DC | PRN

## 2016-04-11 MED ORDER — MIDAZOLAM HCL 2 MG/2ML IJ SOLN
INTRAMUSCULAR | Status: DC | PRN
  Administered 2016-04-11: 1 mg via INTRAVENOUS
  Administered 2016-04-11: 2 mg via INTRAVENOUS
  Administered 2016-04-11: 1 mg via INTRAVENOUS

## 2016-04-11 MED ORDER — ONDANSETRON HCL 4 MG/2ML IJ SOLN: 4.0000 mg | Freq: Four times a day (QID) | INTRAMUSCULAR | Status: DC | PRN

## 2016-04-11 MED ORDER — FENTANYL CITRATE (PF) 100 MCG/2ML IJ SOLN
INTRAMUSCULAR | Status: DC | PRN
  Administered 2016-04-11 (×2): 50 ug via INTRAVENOUS

## 2016-04-11 MED ORDER — METHYLPREDNISOLONE SODIUM SUCC 125 MG IJ SOLR: 125.0000 mg | INTRAMUSCULAR | Status: DC | PRN

## 2016-04-11 MED ORDER — LIDOCAINE-EPINEPHRINE (PF) 1 %-1:200000 IJ SOLN
INTRAMUSCULAR | Status: AC
  Filled 2016-04-11: qty 30

## 2016-04-11 MED ORDER — SODIUM CHLORIDE 0.9 % IV SOLN
INTRAVENOUS | Status: DC
  Administered 2016-04-11: 13:00:00 via INTRAVENOUS

## 2016-04-11 MED ORDER — DEXTROSE 5 % IV SOLN
1.5000 g | INTRAVENOUS | Status: AC
  Administered 2016-04-11: 1.5 g via INTRAVENOUS

## 2016-04-11 SURGICAL SUPPLY — 11 items
BALLN DORADO 8X60X80 (BALLOONS) ×2
BALLN LUTONIX DCB 7X60X130 (BALLOONS) ×2
BALLOON DORADO 8X60X80 (BALLOONS) ×1 IMPLANT
BALLOON LUTONIX DCB 7X60X130 (BALLOONS) ×1 IMPLANT
CANNULA 5F STIFF (CANNULA) ×2 IMPLANT
DEVICE PRESTO INFLATION (MISCELLANEOUS) ×2 IMPLANT
DRAPE BRACHIAL (DRAPES) ×2 IMPLANT
PACK ANGIOGRAPHY (CUSTOM PROCEDURE TRAY) ×2 IMPLANT
SHEATH BRITE TIP 6FRX5.5 (SHEATH) ×2 IMPLANT
TOWEL OR 17X26 4PK STRL BLUE (TOWEL DISPOSABLE) ×2 IMPLANT
WIRE MAGIC TORQUE 260C (WIRE) ×2 IMPLANT

## 2016-04-11 NOTE — Op Note (Signed)
[facility]    OPERATIVE NOTE   PROCEDURE: 1.  Left brachial artery to axillary vein arteriovenous graft cannulation under ultrasound guidance 2.  Left arm shuntogram 3.  Percutaneous transluminal angioplasty of the venous anastomosis with 7 mm diameter drug-coated an 8 mm diameter high pressure angioplasty balloon 4.  Percutaneous transluminal angioplasty of the mid graft with 7 and 8 mm diameter angioplasty balloons  PRE-OPERATIVE DIAGNOSIS: 1. ESRD 2. Malfunctioning left brachial artery to axillary vein arteriovenous graft  POST-OPERATIVE DIAGNOSIS: same as above   SURGEON: Leotis Pain, MD  ANESTHESIA: local with MCS  ESTIMATED BLOOD LOSS: Minimal  FINDING(S): 1. 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 The central venous circulation appeared patent without significant stenosis.  SPECIMEN(S):  None  CONTRAST: 25 cc  FLUORO TIME: 2 minutes  MODERATE CONSCIOUS SEDATION TIME:  Approximately 15 minutes using 4 mg of Versed and 100 mcg of Fentanyl  INDICATIONS: Cynthia Dean is a 65 y.o. female who presents with malfunctioning left brachial artery to axillary vein arteriovenous graft.  The patient is scheduled for  left arm shuntogram.  The patient is aware the risks include but are not limited to: bleeding, infection, thrombosis of the cannulated access, and possible anaphylactic reaction to the contrast.  The patient is aware of the risks of the procedure and elects to proceed forward.  DESCRIPTION: After full informed written consent was obtained, the patient was brought back to the angiography suite and placed supine upon the angiography table.  The patient was connected to monitoring equipment. Moderate conscious sedation was administered during a  face to face encounter throughout the procedure with my supervision of the RN administering medicines and monitoring the patient's vital signs, pulse oximetry, telemetry and mental status throughout from the start of the procedure until the patient was taken to the recovery room The  left arm was prepped and draped in the standard fashion for a percutaneous access intervention.  Under ultrasound guidance, the left brachial artery to axillary vein arteriovenous graft was cannulated with a micropuncture needle under direct ultrasound guidance near the arterial anastomosis in an antegrade fashion and a permanent image was performed.  The microwire was advanced into the fistula and the needle was exchanged for the a microsheath.  I then upsized to a 6 Fr Sheath and imaging was performed.  Hand injections were completed to image the access including the central venous system. 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 The central venous circulation appeared patent without significant stenosis.  Based on the images, this patient will need intervention to the mid graft as well as the venous anastomosis. I then gave the patient 3000 units of intravenous heparin.  I then crossed the stenoses with a Magic Tourqe wire.  Based on the imaging, a 7 mm x 6 cm  Lutonix drug-coated angioplasty balloon was selected.  The balloon was centered around the venous anastomotic stenosis and inflated to 12 ATM for 1 minute(s).  I then pulled this balloon back to the arterial access site and inflated this to 12 atm for 1 minute.  This appeared slightly undersized at the venous anastomosis/axillary vein and so I upsized to an 8 mm diameter by 6 cm length high pressure angioplasty balloon. This was inflated to 16 atm for 1 minute at  the venous anastomosis. It was then pulled back to the venous access site and inflated to 12 atm for 1 minute. On completion imaging, a 20-25 % residual stenosis at most within the graft was present.  The napkin ring like stenosis at the venous anastomosis seems significantly better and I would estimate about a 30% residual stenosis was seen there and into the axillary vein. The flow was much more brisk.   Based on the completion imaging, no further intervention is necessary.  The wire and balloon were removed from the sheath.  A 4-0 Monocryl purse-string suture was sewn around the sheath.  The sheath was removed while tying down the suture.  A sterile bandage was applied to the puncture site.  COMPLICATIONS: None  CONDITION: Stable   Armetta Henri  04/11/2016 3:01 PM

## 2016-04-11 NOTE — H&P (Signed)
[facility] Admission History & Physical  MRN : [id number]  Cynthia Dean is a 65 y.o. ([birth year]) female who presents with chief complaint of No chief complaint on file. [name]  History of Present Illness: patient sent over by dialysis access center for poorly functioning access. Has had multiple previous intervention.  She has no complaints.    Current Facility-Administered Medications  Medication Dose Route Frequency Provider Last Rate Last Dose  . 0.9 %  sodium chloride infusion   Intravenous Continuous Kimberly A Stegmayer, PA-C 10 mL/hr at 04/11/16 1234    . cefUROXime (ZINACEF) 1.5 g in dextrose 5 % 50 mL IVPB  1.5 g Intravenous 30 min Pre-Op Kimberly A Stegmayer, PA-C      . famotidine (PEPCID) tablet 40 mg  40 mg Oral PRN Janalyn Harder Stegmayer, PA-C      . HYDROmorphone (DILAUDID) injection 1 mg  1 mg Intravenous Once [employer], PA-C      . methylPREDNISolone sodium succinate (SOLU-MEDROL) 125 mg/2 mL injection 125 mg  125 mg Intravenous PRN Kimberly A Stegmayer, PA-C      . ondansetron (ZOFRAN) injection 4 mg  4 mg Intravenous Q6H PRN Sela Hua, PA-C        Past Medical History:  Diagnosis Date  . Chronic kidney disease   . COPD (chronic obstructive pulmonary disease) ([place])   . Coronary artery disease   . Diabetes mellitus without complication ([place])   . Heart murmur   . Hyperlipidemia   . Hypertension     Past Surgical History:  Procedure Laterality Date  . BREAST BIOPSY Bilateral 07/19/00   neg  . PERIPHERAL VASCULAR CATHETERIZATION Left 02/02/2015   Procedure: A/V Shuntogram/Fistulagram;  Surgeon: Algernon Huxley, MD;  Location: [facility];  Service: Cardiovascular;  Laterality: Left;  . PERIPHERAL VASCULAR CATHETERIZATION Left 02/02/2015   Procedure: A/V Shunt Intervention;  Surgeon: Algernon Huxley, MD;  Location: [facility];  Service: Cardiovascular;  Laterality: Left;  . PERIPHERAL VASCULAR  CATHETERIZATION Left 03/09/2015   Procedure: A/V Shuntogram/Fistulagram;  Surgeon: Algernon Huxley, MD;  Location: [facility];  Service: Cardiovascular;  Laterality: Left;  . PERIPHERAL VASCULAR CATHETERIZATION N/A 03/09/2015   Procedure: A/V Shunt Intervention;  Surgeon: Algernon Huxley, MD;  Location: [facility];  Service: Cardiovascular;  Laterality: N/A;  . PERIPHERAL VASCULAR CATHETERIZATION Left 04/17/2015   Procedure: Upper Extremity Angiography;  Surgeon: Algernon Huxley, MD;  Location: [facility];  Service: Cardiovascular;  Laterality: Left;  . PERIPHERAL VASCULAR CATHETERIZATION  04/17/2015   Procedure: Upper Extremity Intervention;  Surgeon: Algernon Huxley, MD;  Location: [facility];  Service: Cardiovascular;;  . PERIPHERAL VASCULAR CATHETERIZATION N/A 08/10/2015   Procedure: A/V Shuntogram/Fistulagram;  Surgeon: Algernon Huxley, MD;  Location: [facility];  Service: Cardiovascular;  Laterality: N/A;  . PERIPHERAL VASCULAR CATHETERIZATION N/A 08/10/2015   Procedure: A/V Shunt Intervention;  Surgeon: Algernon Huxley, MD;  Location: [facility];  Service: Cardiovascular;  Laterality: N/A;    Social History Social History  Substance Use Topics  . Smoking status: Former Smoker    Packs/day: 0.00    Types: Cigarettes  . Smokeless tobacco: Not on file  . Alcohol use No    Family History Family History  Problem Relation Age of Onset  . Breast cancer Father 36  . Stroke Mother   . Heart attack Mother   . Heart Problems  Sister   . Breast cancer Cousin 28    1 st cousin. Maternal     No Known Allergies   REVIEW OF SYSTEMS (Negative unless checked)  Constitutional: [] Weight loss  [] Fever  [] Chills Cardiac: [] Chest pain   [] Chest pressure   [] Palpitations   [] Shortness of breath when laying flat   [] Shortness of breath at rest   [] Shortness of breath with exertion. Vascular:  [] Pain in legs with walking   [] Pain in legs at rest   [] Pain in legs  when laying flat   [] Claudication   [] Pain in feet when walking  [] Pain in feet at rest  [] Pain in feet when laying flat   [] History of DVT   [] Phlebitis   [] Swelling in legs   [] Varicose veins   [] Non-healing ulcers Pulmonary:   [] Uses home oxygen   [] Productive cough   [] Hemoptysis   [] Wheeze  [x] COPD   [] Asthma Neurologic:  [] Dizziness  [] Blackouts   [] Seizures   [] History of stroke   [] History of TIA  [] Aphasia   [] Temporary blindness   [] Dysphagia   [] Weakness or numbness in arms   [] Weakness or numbness in legs Musculoskeletal:  [] Arthritis   [] Joint swelling   [] Joint pain   [] Low back pain Hematologic:  [] Easy bruising  [] Easy bleeding   [] Hypercoagulable state   [] Anemic  [] Hepatitis Gastrointestinal:  [] Blood in stool   [] Vomiting blood  [] Gastroesophageal reflux/heartburn   [] Difficulty swallowing. Genitourinary:  [x] Chronic kidney disease   [] Difficult urination  [] Frequent urination  [] Burning with urination   [] Blood in urine Skin:  [] Rashes   [] Ulcers   [] Wounds Psychological:  [] History of anxiety   []  History of major depression.  Physical Examination  Vitals:   04/11/16 1219  BP: (!) 148/73  Pulse: 73  Resp: 16  Temp: 98.4 F (36.9 C)  TempSrc: Oral  SpO2: 98%  Weight: 135.2 kg (298 lb)  Height: 5\' 7"  (1.702 m)   Body mass index is 46.67 kg/m. Gen: WD/WN, NAD, obese Head: [place]/AT, No temporalis wasting. Prominent temp pulse not noted. Ear/Nose/Throat: Hearing grossly intact, nares w/o erythema or drainage, oropharynx w/o Erythema/Exudate,  Eyes: PERRLA, EOMI.  Neck: Supple, no nuchal rigidity.  No JVD.  Pulmonary:  Good air movement, no use of accessory muscles.  Cardiac: RRR, normal S1, S2, no Murmurs, rubs or gallops. Vascular: thrill and bruit present in AVF Vessel Right Left  Radial Palpable Palpable                                   Gastrointestinal: soft, non-tender/non-distended. No guarding/reflex.  Musculoskeletal: M/S 5/5 throughout.   Extremities without ischemic changes.  No deformity or atrophy.  Neurologic: CN 2-12 intact. Pain and light touch intact in extremities.  Symmetrical.  Speech is fluent. Motor exam as listed above. Psychiatric: Judgment intact, Mood & affect appropriate for pt's clinical situation. Dermatologic: No rashes or ulcers noted.  No cellulitis or open wounds. Lymph : No Cervical, Axillary, or Inguinal lymphadenopathy.     CBC Lab Results  Component Value Date   WBC 11.2 (H) 08/09/2015   HGB 12.3 08/09/2015   HCT 38.7 08/09/2015   MCV 86.6 08/09/2015   PLT 201 08/09/2015    BMET    Component Value Date/Time   NA 132 (L) 08/09/2015 0607   NA 135 (L) 06/16/2014 0915   K 4.6 08/09/2015 1158   K 4.8 06/16/2014 0915   CL  98 (L) 08/09/2015 0607   CL 100 06/16/2014 0915   CO2 22 08/09/2015 0607   CO2 22 06/16/2014 0915   GLUCOSE 311 (H) 08/09/2015 0607   GLUCOSE 226 (H) 06/16/2014 0915   BUN 41 (H) 08/09/2015 0607   BUN 47 (H) 06/16/2014 0915   CREATININE 7.60 (H) 08/09/2015 0607   CREATININE 7.60 (H) 06/16/2014 0915   CALCIUM 8.5 (L) 08/09/2015 0607   CALCIUM 7.2 (L) 06/16/2014 0915   GFRNONAA 5 (L) 08/09/2015 0607   GFRNONAA 6 (L) 06/16/2014 0915   GFRNONAA 6 (L) 04/07/2014 0918   GFRAA 6 (L) 08/09/2015 0607   GFRAA 7 (L) 06/16/2014 0915   GFRAA 7 (L) 04/07/2014 0918   CrCl cannot be calculated (Patient's most recent lab result is older than the maximum 21 days allowed.).  COAG Lab Results  Component Value Date   INR 1.0 06/17/2014   INR 1.2 12/15/2013   INR 1.0 09/01/2011    Radiology No results found.    Assessment/Plan 1. Dysfunction of dialysis access.  For fistulagram today.  Risks and benefits discussed 2. ESRD. Needs better access as above. On HD T/Th/Sat 3. HTN. Stable on outpatient medications   Gable Odonohue, MD  04/11/2016 1:00 PM

## 2016-04-12 ENCOUNTER — Encounter: Payer: Self-pay | Admitting: Vascular Surgery

## 2016-04-14 ENCOUNTER — Encounter: Payer: Self-pay | Admitting: Emergency Medicine

## 2016-04-14 ENCOUNTER — Observation Stay: Admit: 2016-04-14 | Payer: Medicare HMO

## 2016-04-14 ENCOUNTER — Emergency Department: Payer: Medicare HMO

## 2016-04-14 ENCOUNTER — Observation Stay
Admission: EM | Admit: 2016-04-14 | Discharge: 2016-04-15 | Disposition: A | Discharge status: Home / Self Care | Payer: Medicare HMO | Attending: Internal Medicine | Admitting: Internal Medicine

## 2016-04-14 ENCOUNTER — Observation Stay: Payer: Medicare HMO

## 2016-04-14 DIAGNOSIS — Z823 Family history of stroke: Secondary | ICD-10-CM | POA: Diagnosis not present

## 2016-04-14 DIAGNOSIS — Z794 Long term (current) use of insulin: Secondary | ICD-10-CM | POA: Insufficient documentation

## 2016-04-14 DIAGNOSIS — E785 Hyperlipidemia, unspecified: Secondary | ICD-10-CM | POA: Insufficient documentation

## 2016-04-14 DIAGNOSIS — Z809 Family history of malignant neoplasm, unspecified: Secondary | ICD-10-CM | POA: Insufficient documentation

## 2016-04-14 DIAGNOSIS — I69320 Aphasia following cerebral infarction: Secondary | ICD-10-CM | POA: Diagnosis not present

## 2016-04-14 DIAGNOSIS — Z992 Dependence on renal dialysis: Secondary | ICD-10-CM | POA: Insufficient documentation

## 2016-04-14 DIAGNOSIS — I120 Hypertensive chronic kidney disease with stage 5 chronic kidney disease or end stage renal disease: Secondary | ICD-10-CM | POA: Diagnosis not present

## 2016-04-14 DIAGNOSIS — Z87891 Personal history of nicotine dependence: Secondary | ICD-10-CM | POA: Insufficient documentation

## 2016-04-14 DIAGNOSIS — Z6841 Body Mass Index (BMI) 40.0 and over, adult: Secondary | ICD-10-CM | POA: Diagnosis not present

## 2016-04-14 DIAGNOSIS — M6281 Muscle weakness (generalized): Secondary | ICD-10-CM

## 2016-04-14 DIAGNOSIS — R4701 Aphasia: Secondary | ICD-10-CM | POA: Diagnosis present

## 2016-04-14 DIAGNOSIS — Z803 Family history of malignant neoplasm of breast: Secondary | ICD-10-CM | POA: Insufficient documentation

## 2016-04-14 DIAGNOSIS — Z8249 Family history of ischemic heart disease and other diseases of the circulatory system: Secondary | ICD-10-CM | POA: Insufficient documentation

## 2016-04-14 DIAGNOSIS — N186 End stage renal disease: Secondary | ICD-10-CM | POA: Insufficient documentation

## 2016-04-14 DIAGNOSIS — Z7982 Long term (current) use of aspirin: Secondary | ICD-10-CM | POA: Insufficient documentation

## 2016-04-14 DIAGNOSIS — E1122 Type 2 diabetes mellitus with diabetic chronic kidney disease: Secondary | ICD-10-CM | POA: Insufficient documentation

## 2016-04-14 DIAGNOSIS — R1310 Dysphagia, unspecified: Secondary | ICD-10-CM | POA: Diagnosis not present

## 2016-04-14 DIAGNOSIS — R278 Other lack of coordination: Secondary | ICD-10-CM

## 2016-04-14 DIAGNOSIS — R011 Cardiac murmur, unspecified: Secondary | ICD-10-CM | POA: Insufficient documentation

## 2016-04-14 DIAGNOSIS — Z7902 Long term (current) use of antithrombotics/antiplatelets: Secondary | ICD-10-CM | POA: Diagnosis not present

## 2016-04-14 DIAGNOSIS — G459 Transient cerebral ischemic attack, unspecified: Secondary | ICD-10-CM | POA: Diagnosis not present

## 2016-04-14 DIAGNOSIS — E041 Nontoxic single thyroid nodule: Secondary | ICD-10-CM | POA: Diagnosis not present

## 2016-04-14 DIAGNOSIS — G9389 Other specified disorders of brain: Secondary | ICD-10-CM | POA: Insufficient documentation

## 2016-04-14 DIAGNOSIS — R262 Difficulty in walking, not elsewhere classified: Secondary | ICD-10-CM

## 2016-04-14 DIAGNOSIS — Z79899 Other long term (current) drug therapy: Secondary | ICD-10-CM | POA: Insufficient documentation

## 2016-04-14 DIAGNOSIS — J449 Chronic obstructive pulmonary disease, unspecified: Secondary | ICD-10-CM | POA: Diagnosis not present

## 2016-04-14 DIAGNOSIS — I2510 Atherosclerotic heart disease of native coronary artery without angina pectoris: Secondary | ICD-10-CM | POA: Diagnosis not present

## 2016-04-14 DIAGNOSIS — I6523 Occlusion and stenosis of bilateral carotid arteries: Secondary | ICD-10-CM | POA: Insufficient documentation

## 2016-04-14 DIAGNOSIS — I69351 Hemiplegia and hemiparesis following cerebral infarction affecting right dominant side: Secondary | ICD-10-CM | POA: Insufficient documentation

## 2016-04-14 DIAGNOSIS — I639 Cerebral infarction, unspecified: Secondary | ICD-10-CM | POA: Diagnosis present

## 2016-04-14 LAB — CBC WITH DIFFERENTIAL/PLATELET
BASOS ABS: 0.1 10*3/uL (ref 0–0.1)
BASOS PCT: 1 %
Eosinophils Absolute: 0.1 10*3/uL (ref 0–0.7)
Eosinophils Relative: 2 %
HEMATOCRIT: 38.2 % (ref 35.0–47.0)
Hemoglobin: 12.6 g/dL (ref 12.0–16.0)
Lymphocytes Relative: 35 %
Lymphs Abs: 2.8 10*3/uL (ref 1.0–3.6)
MCH: 28.9 pg (ref 26.0–34.0)
MCHC: 32.9 g/dL (ref 32.0–36.0)
MCV: 87.8 fL (ref 80.0–100.0)
MONO ABS: 0.7 10*3/uL (ref 0.2–0.9)
Monocytes Relative: 9 %
NEUTROS ABS: 4.2 10*3/uL (ref 1.4–6.5)
NEUTROS PCT: 53 %
PLATELETS: 229 10*3/uL (ref 150–440)
RBC: 4.34 MIL/uL (ref 3.80–5.20)
RDW: 14.6 % — AB (ref 11.5–14.5)
WBC: 7.9 10*3/uL (ref 3.6–11.0)

## 2016-04-14 LAB — TROPONIN I

## 2016-04-14 LAB — BASIC METABOLIC PANEL
ANION GAP: 10 (ref 5–15)
BUN: 19 mg/dL (ref 6–20)
CALCIUM: 8.3 mg/dL — AB (ref 8.9–10.3)
CO2: 24 mmol/L (ref 22–32)
Chloride: 98 mmol/L — ABNORMAL LOW (ref 101–111)
Creatinine, Ser: 5.51 mg/dL — ABNORMAL HIGH (ref 0.44–1.00)
GFR, EST AFRICAN AMERICAN: 9 mL/min — AB (ref >60)
GFR, EST NON AFRICAN AMERICAN: 7 mL/min — AB (ref >60)
Glucose, Bld: 124 mg/dL — ABNORMAL HIGH (ref 65–99)
Potassium: 4 mmol/L (ref 3.5–5.1)
Sodium: 132 mmol/L — ABNORMAL LOW (ref 135–145)

## 2016-04-14 LAB — GLUCOSE, CAPILLARY
GLUCOSE-CAPILLARY: 127 mg/dL — AB (ref 65–99)
Glucose-Capillary: 164 mg/dL — ABNORMAL HIGH (ref 65–99)
Glucose-Capillary: 119 mg/dL — ABNORMAL HIGH (ref 65–99)

## 2016-04-14 MED ORDER — ATORVASTATIN CALCIUM 20 MG PO TABS
80.0000 mg | ORAL_TABLET | Freq: Every evening | ORAL | Status: DC
  Administered 2016-04-14: 18:00:00 80 mg via ORAL
  Filled 2016-04-14 (×2): qty 4

## 2016-04-14 MED ORDER — INSULIN ASPART 100 UNIT/ML ~~LOC~~ SOLN
0.0000 [IU] | Freq: Three times a day (TID) | SUBCUTANEOUS | Status: DC
  Administered 2016-04-14: 4 [IU] via SUBCUTANEOUS
  Administered 2016-04-15 (×2): 3 [IU] via SUBCUTANEOUS
  Filled 2016-04-14: qty 3
  Filled 2016-04-14: qty 4

## 2016-04-14 MED ORDER — CLOPIDOGREL BISULFATE 75 MG PO TABS
75.0000 mg | ORAL_TABLET | Freq: Every day | ORAL | Status: DC
  Administered 2016-04-15: 75 mg via ORAL
  Filled 2016-04-14: qty 1

## 2016-04-14 MED ORDER — IPRATROPIUM-ALBUTEROL 0.5-2.5 (3) MG/3ML IN SOLN: 3.0000 mL | Freq: Four times a day (QID) | RESPIRATORY_TRACT | Status: DC | PRN

## 2016-04-14 MED ORDER — CINACALCET HCL 30 MG PO TABS
60.0000 mg | ORAL_TABLET | Freq: Every day | ORAL | Status: DC
  Administered 2016-04-14 – 2016-04-15 (×2): 60 mg via ORAL
  Filled 2016-04-14 (×3): qty 2

## 2016-04-14 MED ORDER — STROKE: EARLY STAGES OF RECOVERY BOOK
Freq: Once | Status: AC
Start: 2016-04-14 — End: 2016-04-14
  Administered 2016-04-14: 15:00:00

## 2016-04-14 MED ORDER — PREGABALIN 50 MG PO CAPS
50.0000 mg | ORAL_CAPSULE | Freq: Two times a day (BID) | ORAL | Status: DC
  Administered 2016-04-14 – 2016-04-15 (×3): 50 mg via ORAL
  Filled 2016-04-14 (×3): qty 1

## 2016-04-14 MED ORDER — INSULIN ASPART PROT & ASPART (70-30 MIX) 100 UNIT/ML ~~LOC~~ SUSP
25.0000 [IU] | Freq: Two times a day (BID) | SUBCUTANEOUS | Status: DC
  Administered 2016-04-14 – 2016-04-15 (×2): 25 [IU] via SUBCUTANEOUS
  Filled 2016-04-14 (×2): qty 25

## 2016-04-14 MED ORDER — HYDRALAZINE HCL 20 MG/ML IJ SOLN: 10.0000 mg | Freq: Four times a day (QID) | INTRAMUSCULAR | Status: DC | PRN

## 2016-04-14 MED ORDER — ACETAMINOPHEN 650 MG RE SUPP: 650.0000 mg | RECTAL | Status: DC | PRN

## 2016-04-14 MED ORDER — INSULIN ASPART 100 UNIT/ML ~~LOC~~ SOLN
0.0000 [IU] | Freq: Every day | SUBCUTANEOUS | Status: DC
  Filled 2016-04-14: qty 3

## 2016-04-14 MED ORDER — ASPIRIN EC 81 MG PO TBEC
81.0000 mg | DELAYED_RELEASE_TABLET | Freq: Every day | ORAL | Status: DC
  Administered 2016-04-14 – 2016-04-15 (×2): 81 mg via ORAL
  Filled 2016-04-14 (×2): qty 1

## 2016-04-14 MED ORDER — GABAPENTIN 100 MG PO CAPS
100.0000 mg | ORAL_CAPSULE | Freq: Three times a day (TID) | ORAL | Status: DC
  Administered 2016-04-14 – 2016-04-15 (×3): 100 mg via ORAL
  Filled 2016-04-14 (×3): qty 1

## 2016-04-14 MED ORDER — ENOXAPARIN SODIUM 40 MG/0.4ML ~~LOC~~ SOLN: 40.0000 mg | SUBCUTANEOUS | Status: DC

## 2016-04-14 MED ORDER — PANTOPRAZOLE SODIUM 40 MG PO TBEC
40.0000 mg | DELAYED_RELEASE_TABLET | Freq: Every day | ORAL | Status: DC
  Administered 2016-04-14 – 2016-04-15 (×2): 40 mg via ORAL
  Filled 2016-04-14 (×2): qty 1

## 2016-04-14 MED ORDER — HEPARIN SODIUM (PORCINE) 5000 UNIT/ML IJ SOLN
5000.0000 [IU] | Freq: Three times a day (TID) | INTRAMUSCULAR | Status: DC
  Administered 2016-04-14 – 2016-04-15 (×3): 5000 [IU] via SUBCUTANEOUS
  Filled 2016-04-14 (×3): qty 1

## 2016-04-14 MED ORDER — ACETAMINOPHEN 325 MG PO TABS: 650.0000 mg | ORAL_TABLET | ORAL | Status: DC | PRN

## 2016-04-14 MED ORDER — IPRATROPIUM-ALBUTEROL 0.5-2.5 (3) MG/3ML IN SOLN
3.0000 mL | Freq: Two times a day (BID) | RESPIRATORY_TRACT | Status: DC
  Administered 2016-04-14 – 2016-04-15 (×2): 3 mL via RESPIRATORY_TRACT
  Filled 2016-04-14 (×2): qty 3

## 2016-04-14 MED ORDER — ASPIRIN 81 MG PO CHEW
243.0000 mg | CHEWABLE_TABLET | Freq: Once | ORAL | Status: AC
Start: 2016-04-14 — End: 2016-04-14
  Administered 2016-04-14: 243 mg via ORAL
  Filled 2016-04-14: qty 3

## 2016-04-14 MED ORDER — SENNOSIDES-DOCUSATE SODIUM 8.6-50 MG PO TABS: 1.0000 | ORAL_TABLET | Freq: Every evening | ORAL | Status: DC | PRN

## 2016-04-14 MED ORDER — SEVELAMER CARBONATE 800 MG PO TABS
2400.0000 mg | ORAL_TABLET | Freq: Three times a day (TID) | ORAL | Status: DC
  Administered 2016-04-14 – 2016-04-15 (×3): 2400 mg via ORAL
  Filled 2016-04-14 (×3): qty 3

## 2016-04-14 NOTE — ED Provider Notes (Signed)
[facility] Emergency Department Provider Note   ____________________________________________   First MD Initiated Contact with Patient 04/14/16 1102     (approximate)  I have reviewed the triage vital signs and the nursing notes.   HISTORY  Chief Complaint Aphasia and Code Stroke   HPI Cynthia Dean is a 65 y.o. female with a history of end-stage renal disease on dialysis with a history of stroke several months ago who is presented to the emergency department today with a aphasia. She says that the last time she felt normal was at 9 PM last night before going to bed. She said that she woke at 139 morning and her husband noticed at that time, after the patient woke him up, that she had a slurred speech. The patient denies any weakness or numbness and says that the last time she was diagnosed with stroke she had similar symptoms. She says that the symptoms have continued upon waking this morning. She had a dialysis session this morning and came to the emergency department to be evaluated for the slurred speech.   Past Medical History:  Diagnosis Date  . Chronic kidney disease   . COPD (chronic obstructive pulmonary disease) ([place])   . Coronary artery disease   . Diabetes mellitus without complication ([place])   . Heart murmur   . Hyperlipidemia   . Hypertension     Patient Active Problem List   Diagnosis Date Noted  . Essential hypertension, benign 11/27/2015  . Hyperlipidemia 11/27/2015  . Obesity 11/27/2015  . Prolonged Q-T interval on ECG 11/26/2015  . Intractable nausea and vomiting 08/08/2015    Past Surgical History:  Procedure Laterality Date  . BREAST BIOPSY Bilateral 07/19/00   neg  . PERIPHERAL VASCULAR CATHETERIZATION Left 02/02/2015   Procedure: A/V Shuntogram/Fistulagram;  Surgeon: Algernon Huxley, MD;  Location: [place] CV LAB;  Service: Cardiovascular;  Laterality: Left;  . PERIPHERAL VASCULAR CATHETERIZATION Left 02/02/2015    Procedure: A/V Shunt Intervention;  Surgeon: Algernon Huxley, MD;  Location: [place] CV LAB;  Service: Cardiovascular;  Laterality: Left;  . PERIPHERAL VASCULAR CATHETERIZATION Left 03/09/2015   Procedure: A/V Shuntogram/Fistulagram;  Surgeon: Algernon Huxley, MD;  Location: [place] CV LAB;  Service: Cardiovascular;  Laterality: Left;  . PERIPHERAL VASCULAR CATHETERIZATION N/A 03/09/2015   Procedure: A/V Shunt Intervention;  Surgeon: Algernon Huxley, MD;  Location: [place] CV LAB;  Service: Cardiovascular;  Laterality: N/A;  . PERIPHERAL VASCULAR CATHETERIZATION Left 04/17/2015   Procedure: Upper Extremity Angiography;  Surgeon: Algernon Huxley, MD;  Location: [place] CV LAB;  Service: Cardiovascular;  Laterality: Left;  . PERIPHERAL VASCULAR CATHETERIZATION  04/17/2015   Procedure: Upper Extremity Intervention;  Surgeon: Algernon Huxley, MD;  Location: [place] CV LAB;  Service: Cardiovascular;;  . PERIPHERAL VASCULAR CATHETERIZATION N/A 08/10/2015   Procedure: A/V Shuntogram/Fistulagram;  Surgeon: Algernon Huxley, MD;  Location: [place] CV LAB;  Service: Cardiovascular;  Laterality: N/A;  . PERIPHERAL VASCULAR CATHETERIZATION N/A 08/10/2015   Procedure: A/V Shunt Intervention;  Surgeon: Algernon Huxley, MD;  Location: [place] CV LAB;  Service: Cardiovascular;  Laterality: N/A;  . PERIPHERAL VASCULAR CATHETERIZATION Left 04/11/2016   Procedure: A/V Shuntogram/Fistulagram;  Surgeon: Algernon Huxley, MD;  Location: [place] CV LAB;  Service: Cardiovascular;  Laterality: Left;    Prior to Admission medications   Medication Sig Start Date End Date Taking? Authorizing Provider  albuterol-ipratropium (COMBIVENT) 18-103 MCG/ACT inhaler Inhale 2 puffs into the lungs  2 (two) times daily.   Yes Historical Provider, MD  aspirin 81 MG tablet Take 81 mg by mouth daily.   Yes Historical Provider, MD  atorvastatin (LIPITOR) 80 MG tablet Take 80 mg by mouth every evening.    Yes Historical Provider,  MD  cinacalcet (SENSIPAR) 60 MG tablet Take 60 mg by mouth daily.   Yes Historical Provider, MD  clopidogrel (PLAVIX) 75 MG tablet Take 75 mg by mouth daily.   Yes Historical Provider, MD  gabapentin (NEURONTIN) 100 MG capsule Take 100 mg by mouth 3 (three) times daily.   Yes Historical Provider, MD  insulin NPH-regular Human (NOVOLIN 70/30) (70-30) 100 UNIT/ML injection Inject 42 Units into the skin 2 (two) times daily with a meal. Except dialysis days. Do not take on Tuesdays, Thursdays, and Saturdays.   Yes Historical Provider, MD  lidocaine-prilocaine (EMLA) cream Apply 1 application topically as needed.   Yes Historical Provider, MD  metoprolol succinate (TOPROL-XL) 100 MG 24 hr tablet Take 100 mg by mouth every evening. Take with or immediately following a meal.    Yes Historical Provider, MD  omeprazole (PRILOSEC) 20 MG capsule Take 20 mg by mouth 2 (two) times daily.    Yes Historical Provider, MD  pregabalin (LYRICA) 50 MG capsule Take 50 mg by mouth 2 (two) times daily.   Yes Historical Provider, MD  sevelamer carbonate (RENVELA) 800 MG tablet Take 2,400 mg by mouth 3 (three) times daily with meals.    Yes Historical Provider, MD  valsartan (DIOVAN) 160 MG tablet Take 160 mg by mouth every evening.    Yes Historical Provider, MD  ipratropium-albuterol (DUONEB) 0.5-2.5 (3) MG/3ML SOLN Take 3 mLs by nebulization every 6 (six) hours as needed (shortness of breath/ wheezing).     Historical Provider, MD    Allergies Review of patient's allergies indicates no known allergies.  Family History  Problem Relation Age of Onset  . Breast cancer Father 61  . Stroke Mother   . Heart attack Mother   . Heart Problems Sister   . Breast cancer Cousin 55    1 st cousin. Maternal     Social History Social History  Substance Use Topics  . Smoking status: Former Smoker    Packs/day: 0.00    Types: Cigarettes  . Smokeless tobacco: Never Used  . Alcohol use No    Review of  Systems Constitutional: No fever/chills Eyes: No visual changes. ENT: No sore throat. Cardiovascular: Denies chest pain. Respiratory: Denies shortness of breath. Gastrointestinal: No abdominal pain.  No nausea, no vomiting.  No diarrhea.  No constipation. Genitourinary: Negative for dysuria. Musculoskeletal: Negative for back pain. Skin: Negative for rash. Neurological: Negative for headaches, focal weakness or numbness.  10-point ROS otherwise negative.  ____________________________________________   PHYSICAL EXAM:  VITAL SIGNS: ED Triage Vitals  Enc Vitals Group     BP 04/14/16 1056 131/67     Pulse Rate 04/14/16 1056 79     Resp 04/14/16 1056 20     Temp 04/14/16 1056 98.5 F (36.9 C)     Temp Source 04/14/16 1056 Oral     SpO2 04/14/16 1056 97 %     Weight 04/14/16 1057 298 lb (135.2 kg)     Height 04/14/16 1057 5\' 7"  (1.702 m)     Head Circumference --      Peak Flow --      Pain Score --      Pain Loc --  Pain Edu? --      Excl. in [place]? --     Constitutional: Alert and oriented. Well appearing and in no acute distress. Eyes: Conjunctivae are normal. PERRL. EOMI. Head: Atraumatic. Nose: No congestion/rhinnorhea. Mouth/Throat: Mucous membranes are moist.  Neck: No stridor.   Cardiovascular: Normal rate, regular rhythm. Grossly normal heart sounds.  Left upper extremity dialysis access with palpable thrill. Respiratory: Normal respiratory effort.  No retractions. Lungs CTAB. Gastrointestinal: Soft and nontender. No distention. No abdominal bruits. No CVA tenderness. Musculoskeletal: No lower extremity tenderness nor edema.  No joint effusions. Neurologic:  Normal language. Mild slurred speech. No gross focal motor deficits are appreciated.  Skin:  Skin is warm, dry and intact. No rash noted. Psychiatric: Mood and affect are normal. Speech and behavior are normal.  ____________________________________________   LABS (all labs ordered are listed, but only  abnormal results are displayed)  Labs Reviewed  GLUCOSE, CAPILLARY - Abnormal; Notable for the following:       Result Value   Glucose-Capillary 127 (*)    All other components within normal limits  CBC WITH DIFFERENTIAL/PLATELET - Abnormal; Notable for the following:    RDW 14.6 (*)    All other components within normal limits  BASIC METABOLIC PANEL - Abnormal; Notable for the following:    Sodium 132 (*)    Chloride 98 (*)    Glucose, Bld 124 (*)    Creatinine, Ser 5.51 (*)    Calcium 8.3 (*)    GFR calc non Af Amer 7 (*)    GFR calc Af Amer 9 (*)    All other components within normal limits  TROPONIN I   ____________________________________________  EKG  ED ECG REPORT I, Doran Stabler, the attending physician, personally viewed and interpreted this ECG.   Date: 04/14/2016  EKG Time: 1113  Rate: 79  Rhythm: normal sinus rhythm  Axis: Normal axis  Intervals: Normal  ST&T Change: Normal  ____________________________________________  RADIOLOGY   ____________________________________________   PROCEDURES  Procedure(s) performed:   Procedures  Critical Care performed:   ____________________________________________   INITIAL IMPRESSION / ASSESSMENT AND PLAN / ED COURSE  Pertinent labs & imaging results that were available during my care of the patient were reviewed by me and considered in my medical decision making (see chart for details).  ----------------------------------------- 12:53 PM on 04/14/2016 -----------------------------------------  Patient resting comfortably at this time. Discussed the case with the neurologist, Dr. Doy Mince recommends admission to the hospital. Signed the patient out to Dr. Genia Harold. Explain the imaging as well as the plan to the patient as well as the family. The patient took 81 mg of aspirin this morning. I will additionally give her 240 mg here in the hospital.  Clinical Course      ____________________________________________   FINAL CLINICAL IMPRESSION(S) / ED DIAGNOSES  Aphasia. CVA.    NEW MEDICATIONS STARTED DURING THIS VISIT:  New Prescriptions   No medications on file     Note:  This document was prepared using Dragon voice recognition software and may include unintentional dictation errors.    Orbie Pyo, MD 04/14/16 1254

## 2016-04-14 NOTE — ED Triage Notes (Signed)
Pt began feeling "not herself" this morning when she woke up.  States she felt normal when she went to bed last night at 2100.  Pt states she began having "trouble getting my words out" and daughters state her speech is severely slurred.  Pt has had stroke in last several months.

## 2016-04-14 NOTE — ED Notes (Signed)
Patient transported to CT 

## 2016-04-14 NOTE — ED Notes (Addendum)
Blood Glucose = 127

## 2016-04-14 NOTE — Consult Note (Addendum)
Referring Physician: Mody    Chief Complaint: Difficulty with speech  HPI: Cynthia Dean is an 65 y.o. female with a history of stroke in March of this year presenting as aphasia and mild right sided weakness.  Symptoms, particularly speech, resolved for the most part and patient has been on ASA.  Yesterday evening went to bed at baseline.  Awakened at about 0100 to use the bathroom and noted that her speech was different.  When she awakened to go to dialysis this was unchanged.  With no improvement after dialysis patient presented for evaluation.   Initial NIHSS of 1.  Date last known well: Date: 04/13/2016 Time last known well: Time: 21:00 tPA Given: No: Outside time window  Modified Rankin: Rankin Score=2  Past Medical History:  Diagnosis Date  . Chronic kidney disease   . COPD (chronic obstructive pulmonary disease) ([place])   . Coronary artery disease   . Diabetes mellitus without complication ([place])   . Heart murmur   . Hyperlipidemia   . Hypertension     Past Surgical History:  Procedure Laterality Date  . BREAST BIOPSY Bilateral 07/19/00   neg  . PERIPHERAL VASCULAR CATHETERIZATION Left 02/02/2015   Procedure: A/V Shuntogram/Fistulagram;  Surgeon: Algernon Huxley, MD;  Location: [place] CV LAB;  Service: Cardiovascular;  Laterality: Left;  . PERIPHERAL VASCULAR CATHETERIZATION Left 02/02/2015   Procedure: A/V Shunt Intervention;  Surgeon: Algernon Huxley, MD;  Location: [place] CV LAB;  Service: Cardiovascular;  Laterality: Left;  . PERIPHERAL VASCULAR CATHETERIZATION Left 03/09/2015   Procedure: A/V Shuntogram/Fistulagram;  Surgeon: Algernon Huxley, MD;  Location: [place] CV LAB;  Service: Cardiovascular;  Laterality: Left;  . PERIPHERAL VASCULAR CATHETERIZATION N/A 03/09/2015   Procedure: A/V Shunt Intervention;  Surgeon: Algernon Huxley, MD;  Location: [place] CV LAB;  Service: Cardiovascular;  Laterality: N/A;  . PERIPHERAL VASCULAR CATHETERIZATION Left  04/17/2015   Procedure: Upper Extremity Angiography;  Surgeon: Algernon Huxley, MD;  Location: [place] CV LAB;  Service: Cardiovascular;  Laterality: Left;  . PERIPHERAL VASCULAR CATHETERIZATION  04/17/2015   Procedure: Upper Extremity Intervention;  Surgeon: Algernon Huxley, MD;  Location: [place] CV LAB;  Service: Cardiovascular;;  . PERIPHERAL VASCULAR CATHETERIZATION N/A 08/10/2015   Procedure: A/V Shuntogram/Fistulagram;  Surgeon: Algernon Huxley, MD;  Location: [place] CV LAB;  Service: Cardiovascular;  Laterality: N/A;  . PERIPHERAL VASCULAR CATHETERIZATION N/A 08/10/2015   Procedure: A/V Shunt Intervention;  Surgeon: Algernon Huxley, MD;  Location: [place] CV LAB;  Service: Cardiovascular;  Laterality: N/A;  . PERIPHERAL VASCULAR CATHETERIZATION Left 04/11/2016   Procedure: A/V Shuntogram/Fistulagram;  Surgeon: Algernon Huxley, MD;  Location: [place] CV LAB;  Service: Cardiovascular;  Laterality: Left;    Family History  Problem Relation Age of Onset  . Breast cancer Father 55  . Stroke Mother   . Heart attack Mother   . Heart Problems Sister   . Breast cancer Cousin 43    1 st cousin. Maternal    Social History:  reports that she has quit smoking. Her smoking use included Cigarettes. She smoked 0.00 packs per day. She has never used smokeless tobacco. She reports that she does not drink alcohol or use drugs.  Allergies: No Known Allergies  Medications: I have reviewed the patient's current medications. Prior to Admission:  Prior to Admission medications   Medication Sig Start Date End Date Taking? Authorizing Provider  albuterol-ipratropium (COMBIVENT) 18-103 MCG/ACT inhaler Inhale 2 puffs into  the lungs 2 (two) times daily.   Yes Historical Provider, MD  aspirin 81 MG tablet Take 81 mg by mouth daily.   Yes Historical Provider, MD  atorvastatin (LIPITOR) 80 MG tablet Take 80 mg by mouth every evening.    Yes Historical Provider, MD  cinacalcet (SENSIPAR) 60 MG tablet  Take 60 mg by mouth daily.   Yes Historical Provider, MD  clopidogrel (PLAVIX) 75 MG tablet Take 75 mg by mouth daily.   Yes Historical Provider, MD  gabapentin (NEURONTIN) 100 MG capsule Take 100 mg by mouth 3 (three) times daily.   Yes Historical Provider, MD  insulin NPH-regular Human (NOVOLIN 70/30) (70-30) 100 UNIT/ML injection Inject 42 Units into the skin 2 (two) times daily with a meal. Except dialysis days. Do not take on Tuesdays, Thursdays, and Saturdays.   Yes Historical Provider, MD  lidocaine-prilocaine (EMLA) cream Apply 1 application topically as needed.   Yes Historical Provider, MD  metoprolol succinate (TOPROL-XL) 100 MG 24 hr tablet Take 100 mg by mouth every evening. Take with or immediately following a meal.    Yes Historical Provider, MD  omeprazole (PRILOSEC) 20 MG capsule Take 20 mg by mouth 2 (two) times daily.    Yes Historical Provider, MD  pregabalin (LYRICA) 50 MG capsule Take 50 mg by mouth 2 (two) times daily.   Yes Historical Provider, MD  sevelamer carbonate (RENVELA) 800 MG tablet Take 2,400 mg by mouth 3 (three) times daily with meals.    Yes Historical Provider, MD  valsartan (DIOVAN) 160 MG tablet Take 160 mg by mouth every evening.    Yes Historical Provider, MD  ipratropium-albuterol (DUONEB) 0.5-2.5 (3) MG/3ML SOLN Take 3 mLs by nebulization every 6 (six) hours as needed (shortness of breath/ wheezing).     Historical Provider, MD     ROS: History obtained from the patient  General ROS: negative for - chills, fatigue, fever, night sweats, weight gain or weight loss Psychological ROS: negative for - behavioral disorder, hallucinations, memory difficulties, mood swings or suicidal ideation Ophthalmic ROS: negative for - blurry vision, double vision, eye pain or loss of vision ENT ROS: negative for - epistaxis, nasal discharge, oral lesions, sore throat, tinnitus or vertigo Allergy and Immunology ROS: negative for - hives or itchy/watery eyes Hematological  and Lymphatic ROS: negative for - bleeding problems, bruising or swollen lymph nodes Endocrine ROS: negative for - galactorrhea, hair pattern changes, polydipsia/polyuria or temperature intolerance Respiratory ROS: negative for - cough, hemoptysis, shortness of breath or wheezing Cardiovascular ROS: negative for - chest pain, dyspnea on exertion, edema or irregular heartbeat Gastrointestinal ROS: negative for - abdominal pain, diarrhea, hematemesis, nausea/vomiting or stool incontinence Genito-Urinary ROS: negative for - dysuria, hematuria, incontinence or urinary frequency/urgency Musculoskeletal ROS: negative for - joint swelling or muscular weakness Neurological ROS: as noted in HPI Dermatological ROS: negative for rash and skin lesion changes  Physical Examination: Blood pressure 131/67, pulse 79, temperature 98.5 F (36.9 C), resp. rate 20, height 5\' 7"  (1.702 m), weight 135.2 kg (298 lb), SpO2 97 %.  HEENT-  Normocephalic, no lesions, without obvious abnormality.  Normal external eye and conjunctiva.  Normal TM's bilaterally.  Normal auditory canals and external ears. Normal external nose, mucus membranes and septum.  Normal pharynx. Cardiovascular- S1, S2 normal, pulses palpable throughout   Lungs- chest clear, no wheezing, rales, normal symmetric air entry Abdomen- soft, non-tender; bowel sounds normal; no masses,  no organomegaly Extremities- mild BLE edema Lymph-no adenopathy palpable Musculoskeletal-no joint tenderness,  deformity or swelling Skin-warm and dry, no hyperpigmentation, vitiligo, or suspicious lesions  Neurological Examination Mental Status: Alert, oriented, thought content appropriate.  Speech fluent without evidence of aphasia.  Able to follow 3 step commands without difficulty. Cranial Nerves: II: Discs flat bilaterally; Visual fields grossly normal, pupils equal, round, reactive to light and accommodation III,IV, VI: ptosis not present, extra-ocular motions  intact bilaterally V,VII: decreased right NLF, facial light touch sensation normal bilaterally VIII: hearing normal bilaterally IX,X: gag reflex present XI: bilateral shoulder shrug XII: midline tongue extension Motor: Right : Upper extremity   5-/5 With pronatir drift   Left:     Upper extremity   5/5  Lower extremity   4+/5      Lower extremity   5-/5 Tone and bulk:normal tone throughout; no atrophy noted Sensory: Pinprick and light touch intact throughout, bilaterally Deep Tendon Reflexes: 2+ in the upper extremities and absent in the lower extremities Plantars: Right: mute   Left: mute Cerebellar: Normal finger-to-nose testing bilaterally Gait: not tested due to safety concerns    Laboratory Studies:  Basic Metabolic Panel:  Recent Labs Lab 04/14/16 1119  NA 132*  K 4.0  CL 98*  CO2 24  GLUCOSE 124*  BUN 19  CREATININE 5.51*  CALCIUM 8.3*    Liver Function Tests: No results for input(s): AST, ALT, ALKPHOS, BILITOT, PROT, ALBUMIN in the last 168 hours. No results for input(s): LIPASE, AMYLASE in the last 168 hours. No results for input(s): AMMONIA in the last 168 hours.  CBC:  Recent Labs Lab 04/14/16 1119  WBC 7.9  NEUTROABS 4.2  HGB 12.6  HCT 38.2  MCV 87.8  PLT 229    Cardiac Enzymes:  Recent Labs Lab 04/14/16 1119  TROPONINI <0.03    BNP: Invalid input(s): POCBNP  CBG:  Recent Labs Lab 04/14/16 1116  GLUCAP 127*    Microbiology: Results for orders placed or performed in visit on 05/12/13  Clostridium Difficile by PCR     Status: None   Collection Time: 05/17/13  1:49 PM  Result Value Ref Range Status   Micro Text Report   Final       COMMENT                   NEGATIVE-CLOS.DIFFICILE TOXIN NOT DETECTED BY PCR   ANTIBIOTIC                                                        Coagulation Studies: No results for input(s): LABPROT, INR in the last 72 hours.  Urinalysis: No results for input(s): COLORURINE, LABSPEC, PHURINE,  GLUCOSEU, HGBUR, BILIRUBINUR, KETONESUR, PROTEINUR, UROBILINOGEN, NITRITE, LEUKOCYTESUR in the last 168 hours.  Invalid input(s): APPERANCEUR  Lipid Panel:    Component Value Date/Time   CHOL 78 12/25/2013 0413   TRIG 53 12/25/2013 0413   HDL 31 (L) 12/25/2013 0413   VLDL 11 12/25/2013 0413   LDLCALC 36 12/25/2013 0413    HgbA1C:  Lab Results  Component Value Date   HGBA1C 6.9 (H) 08/09/2015    Urine Drug Screen:  No results found for: LABOPIA, COCAINSCRNUR, LABBENZ, AMPHETMU, THCU, LABBARB  Alcohol Level: No results for input(s): ETH in the last 168 hours.  Other results: EKG: sinus rhythm at 79 bpm.  Imaging: Dg Chest 1 View  Result Date:  04/14/2016 CLINICAL DATA:  Slurred speech.  Aphasia.  Shortness of breath. EXAM: CHEST 1 VIEW COMPARISON:  05/19/2014 FINDINGS: Again noted are prominent lung markings that may be chronic. The heart size is within normal limits. The trachea is midline. Calcifications at the aortic arch. No acute bone abnormality. IMPRESSION: Prominent lung markings are similar to the previous examination and probably represent chronic changes. Difficult to exclude mild edema. No focal airspace disease.- Electronically Signed   By: Markus Daft M.D.   On: 04/14/2016 11:42   Ct Head Wo Contrast  Result Date: 04/14/2016 CLINICAL DATA:  65 year old female with shortness of breath and abnormal speech upon waking today. Initial encounter. EXAM: CT HEAD WITHOUT CONTRAST TECHNIQUE: Contiguous axial images were obtained from the base of the skull through the vertex without intravenous contrast. COMPARISON:  Brain MRI 09/23/2015.  Head CT 06/15/2014. FINDINGS: Brain: Sequelae of posterior left MCA white matter predominant infarct which was acute to subacute in March. Encephalomalacia now on that area. Multiple small chronic lacunar infarcts in both cerebellar hemispheres appear stable. Stable gray-white matter differentiation elsewhere. No cortically based acute infarct  identified. No acute intracranial hemorrhage identified. No midline shift, mass effect, or evidence of intracranial mass lesion. No ventriculomegaly. Vascular: Calcified atherosclerosis at the skull base. No suspicious intracranial vascular hyperdensity. Skull: No acute osseous abnormality identified. Sinuses/Orbits: Visualized paranasal sinuses and mastoids are stable and well pneumatized. Other: Visualized orbits and scalp soft tissues are within normal limits. IMPRESSION: 1.  No acute intracranial abnormality. 2. Expected evolution of the small posterior left MCA infarct seen in March. 3. Stable appearance of superimposed chronic small vessel ischemia primarily in the cerebellum. Electronically Signed   By: Genevie Ann M.D.   On: 04/14/2016 12:38    Assessment: 65 y.o. female with a history of stroke and new onset difficulty with speech.  Concern is for recurrent ischemic etiology.  Head CT personally reviewed and shows no acute changes.  Patient on Plavix at home.  Further work up recommended.    Stroke Risk Factors - diabetes mellitus, hyperlipidemia and hypertension  Plan: 1. MRI, MRA  of the brain without contrast 1. HgbA1c, fasting lipid panel 3. PT consult, OT consult, Speech consult 4. Echocardiogram 5. Carotid dopplers 6. Prophylactic therapy-Antiplatelet med: Aspirin - dose 81 mg daily to be given along with Plavix 7. NPO until RN stroke swallow screen 8. Telemetry monitoring 9. Frequent neuro checks   Alexis Goodell, MD Neurology [phone] 04/14/2016, 2:16 PM

## 2016-04-14 NOTE — H&P (Signed)
[facility] NAME: Cynthia Dean    MR#:  [id number]  DATE OF BIRTH:  1951/01/12  DATE OF ADMISSION:  04/14/2016  PRIMARY CARE PHYSICIAN: Lowella Bandy, MD   REQUESTING/REFERRING PHYSICIAN: Dr Dineen Kid  CHIEF COMPLAINT:    Slurred speech and right facial droop HISTORY OF PRESENT ILLNESS:  Cynthia Dean  is a 65 y.o. female with a known history of ESRD on hemodialysis and diabetes who presents with above complaint. Patient was in dialysis when she was complaining of slurred speech and difficulty swallowing. She came to the ER for further evaluation. Patient says that she woke up at 1:30 this morning with similar symptoms however she went to dialysis. Her symptoms worsened and so she was brought here for further evaluation. In the emergency room CT the head showed no acute stroke or intracranial hemorrhage. She continues to have slurred speech as well as difficulty getting her thoughts out. She is a very minimal right facial droop. She has no other neurological deficits.  PAST MEDICAL HISTORY:   Past Medical History:  Diagnosis Date  . Chronic kidney disease   . COPD (chronic obstructive pulmonary disease) ([place])   . Coronary artery disease   . Diabetes mellitus without complication ([place])   . Heart murmur   . Hyperlipidemia   . Hypertension     PAST SURGICAL HISTORY:   Past Surgical History:  Procedure Laterality Date  . BREAST BIOPSY Bilateral 07/19/00   neg  . PERIPHERAL VASCULAR CATHETERIZATION Left 02/02/2015   Procedure: A/V Shuntogram/Fistulagram;  Surgeon: Algernon Huxley, MD;  Location: [facility];  Service: Cardiovascular;  Laterality: Left;  . PERIPHERAL VASCULAR CATHETERIZATION Left 02/02/2015   Procedure: A/V Shunt Intervention;  Surgeon: Algernon Huxley, MD;  Location: [facility];  Service: Cardiovascular;  Laterality: Left;  . PERIPHERAL VASCULAR CATHETERIZATION Left 03/09/2015   Procedure: A/V  Shuntogram/Fistulagram;  Surgeon: Algernon Huxley, MD;  Location: [facility];  Service: Cardiovascular;  Laterality: Left;  . PERIPHERAL VASCULAR CATHETERIZATION N/A 03/09/2015   Procedure: A/V Shunt Intervention;  Surgeon: Algernon Huxley, MD;  Location: [facility];  Service: Cardiovascular;  Laterality: N/A;  . PERIPHERAL VASCULAR CATHETERIZATION Left 04/17/2015   Procedure: Upper Extremity Angiography;  Surgeon: Algernon Huxley, MD;  Location: [facility];  Service: Cardiovascular;  Laterality: Left;  . PERIPHERAL VASCULAR CATHETERIZATION  04/17/2015   Procedure: Upper Extremity Intervention;  Surgeon: Algernon Huxley, MD;  Location: [facility];  Service: Cardiovascular;;  . PERIPHERAL VASCULAR CATHETERIZATION N/A 08/10/2015   Procedure: A/V Shuntogram/Fistulagram;  Surgeon: Algernon Huxley, MD;  Location: [facility];  Service: Cardiovascular;  Laterality: N/A;  . PERIPHERAL VASCULAR CATHETERIZATION N/A 08/10/2015   Procedure: A/V Shunt Intervention;  Surgeon: Algernon Huxley, MD;  Location: [facility];  Service: Cardiovascular;  Laterality: N/A;  . PERIPHERAL VASCULAR CATHETERIZATION Left 04/11/2016   Procedure: A/V Shuntogram/Fistulagram;  Surgeon: Algernon Huxley, MD;  Location: [facility];  Service: Cardiovascular;  Laterality: Left;    SOCIAL HISTORY:   Social History  Substance Use Topics  . Smoking status: Former Smoker    Packs/day: 0.00    Types: Cigarettes  . Smokeless tobacco: Never Used  . Alcohol use No    FAMILY HISTORY:   Family History  Problem Relation Age of Onset  . Breast cancer Father 63  . Stroke Mother   . Heart  attack Mother   . Heart Problems Sister   . Breast cancer Cousin 73    1 st cousin. Maternal     DRUG ALLERGIES:  No Known Allergies  REVIEW OF SYSTEMS:   Review of Systems  Constitutional: Negative.  Negative for chills, fever and malaise/fatigue.  HENT: Negative.  Negative for ear discharge, ear pain,  hearing loss, nosebleeds and sore throat.   Eyes: Negative.  Negative for blurred vision and pain.  Respiratory: Negative.  Negative for cough, hemoptysis, shortness of breath and wheezing.   Cardiovascular: Negative.  Negative for chest pain, palpitations and leg swelling.  Gastrointestinal: Negative.  Negative for abdominal pain, blood in stool, diarrhea, nausea and vomiting.  Genitourinary: Negative.  Negative for dysuria.  Musculoskeletal: Negative.  Negative for back pain.  Skin: Negative.   Neurological: Positive for speech change. Negative for dizziness, tremors, focal weakness, seizures and headaches.  Endo/Heme/Allergies: Negative.  Does not bruise/bleed easily.  Psychiatric/Behavioral: Negative.  Negative for depression, hallucinations and suicidal ideas.    MEDICATIONS AT HOME:   Prior to Admission medications   Medication Sig Start Date End Date Taking? Authorizing Provider  albuterol-ipratropium (COMBIVENT) 18-103 MCG/ACT inhaler Inhale 2 puffs into the lungs 2 (two) times daily.   Yes Historical Provider, MD  aspirin 81 MG tablet Take 81 mg by mouth daily.   Yes Historical Provider, MD  atorvastatin (LIPITOR) 80 MG tablet Take 80 mg by mouth every evening.    Yes Historical Provider, MD  cinacalcet (SENSIPAR) 60 MG tablet Take 60 mg by mouth daily.   Yes Historical Provider, MD  clopidogrel (PLAVIX) 75 MG tablet Take 75 mg by mouth daily.   Yes Historical Provider, MD  gabapentin (NEURONTIN) 100 MG capsule Take 100 mg by mouth 3 (three) times daily.   Yes Historical Provider, MD  insulin NPH-regular Human (NOVOLIN 70/30) (70-30) 100 UNIT/ML injection Inject 42 Units into the skin 2 (two) times daily with a meal. Except dialysis days. Do not take on Tuesdays, Thursdays, and Saturdays.   Yes Historical Provider, MD  lidocaine-prilocaine (EMLA) cream Apply 1 application topically as needed.   Yes Historical Provider, MD  metoprolol succinate (TOPROL-XL) 100 MG 24 hr tablet Take  100 mg by mouth every evening. Take with or immediately following a meal.    Yes Historical Provider, MD  omeprazole (PRILOSEC) 20 MG capsule Take 20 mg by mouth 2 (two) times daily.    Yes Historical Provider, MD  pregabalin (LYRICA) 50 MG capsule Take 50 mg by mouth 2 (two) times daily.   Yes Historical Provider, MD  sevelamer carbonate (RENVELA) 800 MG tablet Take 2,400 mg by mouth 3 (three) times daily with meals.    Yes Historical Provider, MD  valsartan (DIOVAN) 160 MG tablet Take 160 mg by mouth every evening.    Yes Historical Provider, MD  ipratropium-albuterol (DUONEB) 0.5-2.5 (3) MG/3ML SOLN Take 3 mLs by nebulization every 6 (six) hours as needed (shortness of breath/ wheezing).     Historical Provider, MD      VITAL SIGNS:  Blood pressure 131/67, pulse 79, temperature 98.5 F (36.9 C), resp. rate 20, height 5\' 7"  (1.702 m), weight 135.2 kg (298 lb), SpO2 97 %.  PHYSICAL EXAMINATION:   Physical Exam  Constitutional: She is oriented to person, place, and time and well-developed, well-nourished, and in no distress. No distress.  HENT:  Head: Normocephalic.  Eyes: No scleral icterus.  Neck: Normal range of motion. Neck supple. No JVD present. No tracheal deviation  present.  Cardiovascular: Normal rate, regular rhythm and normal heart sounds.  Exam reveals no gallop and no friction rub.   No murmur heard. Pulmonary/Chest: Effort normal and breath sounds normal. No respiratory distress. She has no wheezes. She has no rales. She exhibits no tenderness.  Abdominal: Soft. Bowel sounds are normal. She exhibits no distension and no mass. There is no tenderness. There is no rebound and no guarding.  Musculoskeletal: Normal range of motion. She exhibits no edema.  Neurological: She is alert and oriented to person, place, and time.  Perhaps a very minimal/slight right-sided facial droop  Skin: Skin is warm. No rash noted. No erythema.  Psychiatric: Affect and judgment normal.       LABORATORY PANEL:   CBC  Recent Labs Lab 04/14/16 1119  WBC 7.9  HGB 12.6  HCT 38.2  PLT 229   ------------------------------------------------------------------------------------------------------------------  Chemistries   Recent Labs Lab 04/14/16 1119  NA 132*  K 4.0  CL 98*  CO2 24  GLUCOSE 124*  BUN 19  CREATININE 5.51*  CALCIUM 8.3*   ------------------------------------------------------------------------------------------------------------------  Cardiac Enzymes  Recent Labs Lab 04/14/16 1119  TROPONINI <0.03   ------------------------------------------------------------------------------------------------------------------  RADIOLOGY:  Dg Chest 1 View  Result Date: 04/14/2016 CLINICAL DATA:  Slurred speech.  Aphasia.  Shortness of breath. EXAM: CHEST 1 VIEW COMPARISON:  05/19/2014 FINDINGS: Again noted are prominent lung markings that may be chronic. The heart size is within normal limits. The trachea is midline. Calcifications at the aortic arch. No acute bone abnormality. IMPRESSION: Prominent lung markings are similar to the previous examination and probably represent chronic changes. Difficult to exclude mild edema. No focal airspace disease.- Electronically Signed   By: Markus Daft M.D.   On: 04/14/2016 11:42   Ct Head Wo Contrast  Result Date: 04/14/2016 CLINICAL DATA:  65 year old female with shortness of breath and abnormal speech upon waking today. Initial encounter. EXAM: CT HEAD WITHOUT CONTRAST TECHNIQUE: Contiguous axial images were obtained from the base of the skull through the vertex without intravenous contrast. COMPARISON:  Brain MRI 09/23/2015.  Head CT 06/15/2014. FINDINGS: Brain: Sequelae of posterior left MCA white matter predominant infarct which was acute to subacute in March. Encephalomalacia now on that area. Multiple small chronic lacunar infarcts in both cerebellar hemispheres appear stable. Stable gray-white matter  differentiation elsewhere. No cortically based acute infarct identified. No acute intracranial hemorrhage identified. No midline shift, mass effect, or evidence of intracranial mass lesion. No ventriculomegaly. Vascular: Calcified atherosclerosis at the skull base. No suspicious intracranial vascular hyperdensity. Skull: No acute osseous abnormality identified. Sinuses/Orbits: Visualized paranasal sinuses and mastoids are stable and well pneumatized. Other: Visualized orbits and scalp soft tissues are within normal limits. IMPRESSION: 1.  No acute intracranial abnormality. 2. Expected evolution of the small posterior left MCA infarct seen in March. 3. Stable appearance of superimposed chronic small vessel ischemia primarily in the cerebellum. Electronically Signed   By: Genevie Ann M.D.   On: 04/14/2016 12:38    EKG:   Sinus rhythm without ST elevation or depression    IMPRESSION AND PLAN:   65 year old female with ESRD on hemodialysis and essential hypertension who presents with slurred speech, aphasia and right facial droop.  1. CVA: Patient continues to have aphasia with slurred speech and right facial droop. Symptoms have improved since this morning. Neurology consult. PT/OT/speech consult. Continue aspirin and statin therapy. MRI, echo and carotid Doppler ordered.  2. ESRD on hemodialysis: Consult nephrology for next dialysis session which will be on  Saturday if patient remains in the hospital.  3. Essential hypertension: For now I will discontinue hypertensive medications to allow perfusion due to problem #1. If MRI is negative then restart hypertensive medications. When necessary hydralazine has been ordered with parameters.  4. Hyperlipidemia: Check lipid panel and continue atorvastatin.  5. Morbid obesity: Encouraged weight loss as tolerated.  6. Diabetes: Continue sliding scale insulin, ADA diet and outpatient insulin regimen (decreased dose)  if patient passes swallow  evaluation. DM coordinator consult.  All the records are reviewed and case discussed with ED provider. Management plans discussed with the patient and family and she in agreement  CODE STATUS: full  TOTAL TIME TAKING CARE OF THIS PATIENT: 50 minutes.    Delaila Nand M.D on 04/14/2016 at 1:56 PM  Between 7am to 6pm - Pager - [phone]  After 6pm go to [email or web address] - password [email or web address]  [facility]  Office  [phone]  CC: Primary care physician; Lowella Bandy, MD

## 2016-04-14 NOTE — ED Notes (Signed)
Informed RN bed ready 

## 2016-04-15 ENCOUNTER — Observation Stay: Payer: Medicare HMO

## 2016-04-15 LAB — LIPID PANEL
CHOL/HDL RATIO: 3.6 ratio
CHOLESTEROL: 89 mg/dL (ref 0–200)
HDL: 25 mg/dL — AB (ref >40)
LDL CALC: 32 mg/dL (ref 0–99)
TRIGLYCERIDES: 159 mg/dL — AB (ref <150)
VLDL: 32 mg/dL (ref 0–40)

## 2016-04-15 LAB — GLUCOSE, CAPILLARY
Glucose-Capillary: 122 mg/dL — ABNORMAL HIGH (ref 65–99)
Glucose-Capillary: 146 mg/dL — ABNORMAL HIGH (ref 65–99)

## 2016-04-15 NOTE — Evaluation (Signed)
Physical Therapy Evaluation Patient Details Name: Cynthia Dean MRN: [id number] DOB: April 26, 1951 Today's Date: 04/15/2016   History of Present Illness  65 y.o. female with a known history of ESRD on hemodialysis and diabetes who presents with above complaint. Patient was in dialysis when she was complaining of slurred speech and difficulty swallowing. She continues to have issues with ability to find words and initiate/perform coordination tasks.  Clinical Impression  Pt pleasant t/o session and eager to work with PT.  Initially she had a very difficult time getting words out, but with time and simplified questioning she did well.  Pt with decreased U&LE coordination and needed excessive concentration to deliberately do some activities.  She did not have any overt LOBs or safety issues with brief bout of ambulation, but ultimately displayed very poor activity tolerance and was exhausted with the effort.      Follow Up Recommendations Home health PT    Equipment Recommendations       Recommendations for Other Services       Precautions / Restrictions Precautions Precautions: None Restrictions Weight Bearing Restrictions: No      Mobility  Bed Mobility Overal bed mobility: Independent             General bed mobility comments: Pt was able to get herself to EOB, she had need of the rails but was able to get to sitting w/o direct assist  Transfers Overall transfer level: Modified independent Equipment used: None             General transfer comment: Pt able to rise to standing without direct assist.  She showed some hesitancy but overall did well.   Ambulation/Gait Ambulation/Gait assistance: Min guard Ambulation Distance (Feet): 70 Feet Assistive device: None       General Gait Details: Pt with slow, waddling gait that she reports is worse than her baseline.  She did not have any overt LOBs, but was not confident with ambulation and generally.  She showed  good effort but fatigued very quickly and was exhauseted when we got back to her room.   Stairs            Wheelchair Mobility    Modified Rankin (Stroke Patients Only)       Balance Overall balance assessment: Modified Independent                               Standardized Balance Assessment Standardized Balance Assessment :  (>10 sec with eyes closed, LOB with mod perterbations)           Pertinent Vitals/Pain Pain Assessment: No/denies pain    Home Living Family/patient expects to be discharged to:: Private residence Living Arrangements: Spouse/significant other Available Help at Discharge: Family Type of Home: House Home Access: Stairs to enter Entrance Stairs-Rails:  (has single rail) Technical brewer of Steps: 2 Home Layout: One level Home Equipment: Hand held shower head      Prior Function Level of Independence: Needs assistance         Comments: Pt reports that she would do limited distance ambulation, generally only out of the house with husband     Hand Dominance        Extremity/Trunk Assessment   Upper Extremity Assessment: Defer to OT evaluation           Lower Extremity Assessment: Overall WFL for tasks assessed (equal strength and quality of movement bilaterally)  Communication      Cognition Arousal/Alertness: Awake/alert Behavior During Therapy: WFL for tasks assessed/performed Overall Cognitive Status: Impaired/Different from baseline Area of Impairment: Problem solving (Pt alert and aware, having some expressive issues)                    General Comments      Exercises     Assessment/Plan    PT Assessment Patient needs continued PT services  PT Problem List Decreased activity tolerance;Decreased balance;Decreased coordination;Decreased cognition;Decreased safety awareness;Decreased strength          PT Treatment Interventions Gait training;Stair training;Functional mobility  training;Therapeutic activities;Therapeutic exercise;Neuromuscular re-education;Balance training;Cognitive remediation;Patient/family education    PT Goals (Current goals can be found in the Care Plan section)  Acute Rehab PT Goals Patient Stated Goal: go home PT Goal Formulation: With patient Time For Goal Achievement: 04/29/16 Potential to Achieve Goals: Good    Frequency Min 2X/week   Barriers to discharge        Co-evaluation               End of Session Equipment Utilized During Treatment: Gait belt Activity Tolerance: Patient limited by fatigue Patient left: with call bell/phone within reach;in chair      Functional Assessment Tool Used: clinical judgement Functional Limitation: Mobility: Walking and moving around Mobility: Walking and Moving Around Current Status (K8003): At least 20 percent but less than 40 percent impaired, limited or restricted Mobility: Walking and Moving Around Goal Status [phone]): At least 1 percent but less than 20 percent impaired, limited or restricted    Time: 1111-1128 PT Time Calculation (min) (ACUTE ONLY): 17 min   Charges:   PT Evaluation $PT Eval Low Complexity: 1 Procedure     PT G Codes:   PT G-Codes **NOT FOR INPATIENT CLASS** Functional Assessment Tool Used: clinical judgement Functional Limitation: Mobility: Walking and moving around Mobility: Walking and Moving Around Current Status (X5056): At least 20 percent but less than 40 percent impaired, limited or restricted Mobility: Walking and Moving Around Goal Status [phone]): At least 1 percent but less than 20 percent impaired, limited or restricted    Kreg Shropshire, DPT 04/15/2016, 12:14 PM

## 2016-04-15 NOTE — Discharge Planning (Signed)
Pt IV and tele removed.  Discharge paper given, explained and educated.  Informed of suggested FU appts and also given script for glucometer and strips.  Stressed importance of checking BS before giving insulin at home.  RN assessment and VS revealed stability for DC to home with [facility].  When ready, patient will be wheeled to front and family transporting home via car.

## 2016-04-15 NOTE — Discharge Instructions (Signed)
Resume your HD as before °

## 2016-04-15 NOTE — Discharge Summary (Signed)
[facility] NAME: Cynthia Dean    MR#:  [id number]  DATE OF BIRTH:  Oct 21, 1950  DATE OF ADMISSION:  04/14/2016 ADMITTING PHYSICIAN: Bettey Costa, MD  DATE OF DISCHARGE: 04/15/16  PRIMARY CARE PHYSICIAN: Lowella Bandy, MD    ADMISSION DIAGNOSIS:  Aphasia [R47.01] Stroke (cerebrum) ([place]) [I63.9] Cerebral infarction due to unspecified mechanism [I63.9]  DISCHARGE DIAGNOSIS:  TIA Incidental left thyroid nodule (2.7 cm) SECONDARY DIAGNOSIS:   Past Medical History:  Diagnosis Date  . Chronic kidney disease   . COPD (chronic obstructive pulmonary disease) ([place])   . Coronary artery disease   . Diabetes mellitus without complication ([place])   . Heart murmur   . Hyperlipidemia   . Hypertension     HOSPITAL COURSE:   65 year old female with ESRD on hemodialysis and essential hypertension who presents with slurred speech, aphasia and right facial droop.  1. TIA -: Patient came in with aphasia with slurred speech and right facial droop-now reolved Symptoms have improved since this morning. Neurology consult appreciated PT/OT/speech consult. Continue aspirin,plavix and statin therapy. MRI neg for new stroke  echo in may 2017 showed EF 60%  carotid Doppler showed <505 bilateral carotid stenosis  2. ESRD on hemodialysis: Resume HD  3. Essential hypertension:  -resume home meds  4. Hyperlipidemia  continue atorvastatin.  5. Morbid obesity: Encouraged weight loss as tolerated.  6. Diabetes: Continue sliding scale insulin, ADA diet and outpatient insulin regimen (decreased dose)  if patient passes swallow evaluation.  7. Incidental left thyroid nodule 2.7 cm noted on Carotid USG Pt and dter informed to get out pt USG  Overall stable D/c home with HHPT CONSULTS OBTAINED:  Treatment Team:  Catarina Hartshorn, MD Alexis Goodell, MD Murlean Iba, MD  DRUG ALLERGIES:  No Known Allergies  DISCHARGE  MEDICATIONS:   Current Discharge Medication List    CONTINUE these medications which have NOT CHANGED   Details  albuterol-ipratropium (COMBIVENT) 18-103 MCG/ACT inhaler Inhale 2 puffs into the lungs 2 (two) times daily.    aspirin 81 MG tablet Take 81 mg by mouth daily.    atorvastatin (LIPITOR) 80 MG tablet Take 80 mg by mouth every evening.     cinacalcet (SENSIPAR) 60 MG tablet Take 60 mg by mouth daily.    clopidogrel (PLAVIX) 75 MG tablet Take 75 mg by mouth daily.    gabapentin (NEURONTIN) 100 MG capsule Take 100 mg by mouth 3 (three) times daily.    insulin NPH-regular Human (NOVOLIN 70/30) (70-30) 100 UNIT/ML injection Inject 42 Units into the skin 2 (two) times daily with a meal. Except dialysis days. Do not take on Tuesdays, Thursdays, and Saturdays.    lidocaine-prilocaine (EMLA) cream Apply 1 application topically as needed.    metoprolol succinate (TOPROL-XL) 100 MG 24 hr tablet Take 100 mg by mouth every evening. Take with or immediately following a meal.     omeprazole (PRILOSEC) 20 MG capsule Take 20 mg by mouth 2 (two) times daily.     pregabalin (LYRICA) 50 MG capsule Take 50 mg by mouth 2 (two) times daily.    sevelamer carbonate (RENVELA) 800 MG tablet Take 2,400 mg by mouth 3 (three) times daily with meals.     valsartan (DIOVAN) 160 MG tablet Take 160 mg by mouth every evening.     ipratropium-albuterol (DUONEB) 0.5-2.5 (3) MG/3ML SOLN Take 3 mLs by nebulization every 6 (six) hours as needed (shortness of breath/ wheezing).  If you experience worsening of your admission symptoms, develop shortness of breath, life threatening emergency, suicidal or homicidal thoughts you must seek medical attention immediately by calling 911 or calling your MD immediately  if symptoms less severe.  You Must read complete instructions/literature along with all the possible adverse reactions/side effects for all the Medicines you take and that have been prescribed to  you. Take any new Medicines after you have completely understood and accept all the possible adverse reactions/side effects.   Please note  You were cared for by a hospitalist during your hospital stay. If you have any questions about your discharge medications or the care you received while you were in the hospital after you are discharged, you can call the unit and asked to speak with the hospitalist on call if the hospitalist that took care of you is not available. Once you are discharged, your primary care physician will handle any further medical issues. Please note that NO REFILLS for any discharge medications will be authorized once you are discharged, as it is imperative that you return to your primary care physician (or establish a relationship with a primary care physician if you do not have one) for your aftercare needs so that they can reassess your need for medications and monitor your lab values. Today   SUBJECTIVE   No compalitns  VITAL SIGNS:  Blood pressure 110/62, pulse 80, temperature 98.6 F (37 C), temperature source Oral, resp. rate 18, height 5\' 7"  (1.702 m), weight 135.2 kg (298 lb), SpO2 98 %.  I/O:   Intake/Output Summary (Last 24 hours) at 04/15/16 1423 Last data filed at 04/14/16 2300  Gross per 24 hour  Intake              220 ml  Output                0 ml  Net              220 ml    PHYSICAL EXAMINATION:  GENERAL:  65 y.o.-year-old patient lying in the bed with no acute distress. obese EYES: Pupils equal, round, reactive to light and accommodation. No scleral icterus. Extraocular muscles intact.  HEENT: Head atraumatic, normocephalic. Oropharynx and nasopharynx clear.  NECK:  Supple, no jugular venous distention. No thyroid enlargement, no tenderness.  LUNGS: Normal breath sounds bilaterally, no wheezing, rales,rhonchi or crepitation. No use of accessory muscles of respiration.  CARDIOVASCULAR: S1, S2 normal. No murmurs, rubs, or gallops.  ABDOMEN: Soft,  non-tender, non-distended. Bowel sounds present. No organomegaly or mass.  EXTREMITIES: No pedal edema, cyanosis, or clubbing.  NEUROLOGIC: Cranial nerves II through XII are intact. Muscle strength 5/5 in all extremities. Sensation intact. Gait not checked.  PSYCHIATRIC: The patient is alert and oriented x 3.  SKIN: No obvious rash, lesion, or ulcer.   DATA REVIEW:   CBC   Recent Labs Lab 04/14/16 1119  WBC 7.9  HGB 12.6  HCT 38.2  PLT 229    Chemistries   Recent Labs Lab 04/14/16 1119  NA 132*  K 4.0  CL 98*  CO2 24  GLUCOSE 124*  BUN 19  CREATININE 5.51*  CALCIUM 8.3*    Microbiology Results   No results found for this or any previous visit (from the past 240 hour(s)).  RADIOLOGY:  Dg Chest 1 View  Result Date: 04/14/2016 CLINICAL DATA:  Slurred speech.  Aphasia.  Shortness of breath. EXAM: CHEST 1 VIEW COMPARISON:  05/19/2014 FINDINGS: Again noted are prominent  lung markings that may be chronic. The heart size is within normal limits. The trachea is midline. Calcifications at the aortic arch. No acute bone abnormality. IMPRESSION: Prominent lung markings are similar to the previous examination and probably represent chronic changes. Difficult to exclude mild edema. No focal airspace disease.- Electronically Signed   By: Markus Daft M.D.   On: 04/14/2016 11:42   Ct Head Wo Contrast  Result Date: 04/14/2016 CLINICAL DATA:  65 year old female with shortness of breath and abnormal speech upon waking today. Initial encounter. EXAM: CT HEAD WITHOUT CONTRAST TECHNIQUE: Contiguous axial images were obtained from the base of the skull through the vertex without intravenous contrast. COMPARISON:  Brain MRI 09/23/2015.  Head CT 06/15/2014. FINDINGS: Brain: Sequelae of posterior left MCA white matter predominant infarct which was acute to subacute in March. Encephalomalacia now on that area. Multiple small chronic lacunar infarcts in both cerebellar hemispheres appear stable.  Stable gray-white matter differentiation elsewhere. No cortically based acute infarct identified. No acute intracranial hemorrhage identified. No midline shift, mass effect, or evidence of intracranial mass lesion. No ventriculomegaly. Vascular: Calcified atherosclerosis at the skull base. No suspicious intracranial vascular hyperdensity. Skull: No acute osseous abnormality identified. Sinuses/Orbits: Visualized paranasal sinuses and mastoids are stable and well pneumatized. Other: Visualized orbits and scalp soft tissues are within normal limits. IMPRESSION: 1.  No acute intracranial abnormality. 2. Expected evolution of the small posterior left MCA infarct seen in March. 3. Stable appearance of superimposed chronic small vessel ischemia primarily in the cerebellum. Electronically Signed   By: Genevie Ann M.D.   On: 04/14/2016 12:38   Mr Brain Wo Contrast  Result Date: 04/15/2016 CLINICAL DATA:  Slurred speech and difficulty swallowing in dialysis. EXAM: MRI HEAD WITHOUT CONTRAST TECHNIQUE: Multiplanar, multiecho pulse sequences of the brain and surrounding structures were obtained without intravenous contrast. COMPARISON:  09/23/2015 FINDINGS: Brain: Expected evolution of left parietal infarcts seen previously, now with dense gliosis. Few subtle diffusion hyperintense foci in the subcortical posterior left frontal region which appear isointense on ADC map. Suspect these are interval fading infarcts. Remote small vessel infarcts in the bilateral cerebellum. No acute hemorrhage or generalized chronic hemorrhagic injury. Normal cerebral volume. Vascular: Normal flow voids. Skull and upper cervical spine: Normal marrow signal. Sinuses/Orbits: Negative. Other: None. IMPRESSION: 1. No acute finding. 2. Suspect small interval white matter infarcts in the left frontal white matter since 09/23/2015. 3. Expected evolution of left parietal infarct seen 09/23/2015. Electronically Signed   By: Monte Fantasia M.D.   On:  04/15/2016 09:02   US Carotid Bilateral (at [facility] Only)  Result Date: 04/15/2016 CLINICAL DATA:  Stroke. EXAM: BILATERAL CAROTID DUPLEX ULTRASOUND TECHNIQUE: Pearline Cables scale imaging, color Doppler and duplex ultrasound were performed of bilateral carotid and vertebral arteries in the neck. COMPARISON:  CT 04/14/2016.  Ultrasound 09/23/2015. FINDINGS: Criteria: Quantification of carotid stenosis is based on velocity parameters that correlate the residual internal carotid diameter with NASCET-based stenosis levels, using the diameter of the distal internal carotid lumen as the denominator for stenosis measurement. The following velocity measurements were obtained: RIGHT ICA:  98/16 cm/sec CCA:  762/83 cm/sec SYSTOLIC ICA/CCA RATIO:  0.9 DIASTOLIC ICA/CCA RATIO:  1.6 ECA:  100 cm/sec LEFT ICA:  97/22 cm/sec CCA:  151/76 cm/sec SYSTOLIC ICA/CCA RATIO:  0.8 DIASTOLIC ICA/CCA RATIO:  1.9 ECA:  125 cm/sec RIGHT CAROTID ARTERY: Mild right carotid bifurcation atherosclerotic vascular disease. No flow limiting stenosis. RIGHT VERTEBRAL ARTERY:  Patent with antegrade flow. LEFT CAROTID ARTERY: Mild  left carotid bifurcation atherosclerotic vascular disease. No flow limiting stenosis. LEFT VERTEBRAL ARTERY:  Patent with antegrade flow. Incidental note is made of a 2.7 x 1.9 x 1.9 cm complex left lobe thyroid nodule. Dedicated thyroid ultrasound suggest for further evaluation. IMPRESSION: 1. Mild bilateral carotid bifurcation atherosclerotic vascular disease. Similar findings noted on prior exam . Degree of stenosis less than 50% bilaterally. 2.  Vertebral arteries are patent with antegrade flow. 3. Incidental note is made of a complex 2.7 x 1.9 x 1.9 cm left lobe thyroid nodule. Dedicated thyroid ultrasound suggested for further evaluation. Electronically Signed   By: Marcello Moores  Register   On: 04/15/2016 06:31     Management plans discussed with the patient, family and they are in agreement.  CODE STATUS:     Code  Status Orders        Start     Ordered   04/14/16 1330  Full code  Continuous     04/14/16 1330    Code Status History    Date Active Date Inactive Code Status Order ID Comments User Context   04/14/2016  1:30 PM 04/14/2016  5:00 PM Full Code [id number]  Bettey Costa, MD ED   08/08/2015 10:57 PM 08/09/2015  8:54 PM Full Code [id number]  Lytle Butte, MD ED   04/17/2015  1:34 PM 04/17/2015  6:36 PM Full Code [id number]  Algernon Huxley, MD Inpatient      TOTAL TIME TAKING CARE OF THIS PATIENT:40 minutes.    Jobe Mutch M.D on 04/15/2016 at 2:23 PM  Between 7am to 6pm - Pager - [phone] After 6pm go to [email or web address] - password [email or web address] [facility]  Office  [phone]  CC: Primary care physician; Lowella Bandy, MD

## 2016-04-15 NOTE — Evaluation (Addendum)
Occupational Therapy Evaluation Patient Details Name: Cynthia Dean MRN: [id number] DOB: Apr 18, 1951 Today's Date: 04/15/2016    History of Present Illness     Clinical Impression   Pt. Is a 65 y.o. Female who was admitted to [facility] with right sided weakness. Pt. Presents with dominant RUE weakness, and  impaired coordination skills which hinder ADL/IADL functioning. Pt. Could benefit from skilled OT services for ADL and IADL training, A/E and DME training, and pt. Education about energy conservation/work simplification strategies for home. Pt. Plans to return home. Pt. Could benefit from follow up OT services to improve RUE functioning for use during ADL/IADLs.      Follow Up Recommendations  Home health OT    Equipment Recommendations       Recommendations for Other Services       Precautions / Restrictions                ADL Overall ADL's : Needs assistance/impaired Eating/Feeding: Set up   Grooming: Set up               Lower Body Dressing: Minimal assistance;Set up               Functional mobility during ADLs: Modified independent       Vision     Perception     Praxis      Pertinent Vitals/Pain Pain Assessment: 0-10 Pain Score: 0-No pain     Hand Dominance Right   Extremity/Trunk Assessment Upper Extremity Assessment Upper Extremity Assessment: RUE deficits/detail RUE Deficits / Details: RUE stength 4-/5, Grip strength: R: 5#. L: 35# RUE Sensation: decreased proprioception RUE Coordination: decreased fine motor       Communication     Cognition Arousal/Alertness: Awake/alert Behavior During Therapy: WFL for tasks assessed/performed Overall Cognitive Status: Impaired/Different from baseline Area of Impairment: Problem solving             Problem Solving: Slow processing     General Comments       Exercises       Shoulder Instructions      Home Living Family/patient expects to be discharged to:: Private  residence Living Arrangements: Spouse/significant other Available Help at Discharge: Family Type of Home: House Home Access: Stairs to enter Technical brewer of Steps: 2   Home Layout: One level     Bathroom Shower/Tub: Tub/shower unit;Curtain Shower/tub characteristics: Architectural technologist: Standard     Home Equipment: Hand held shower head          Prior Functioning/Environment Level of Independence: Needs assistance    ADL's / Homemaking Assistance Needed: Pt. reports her husband would help her with self-care when needed.            OT Problem List: Decreased strength;Decreased coordination;Decreased activity tolerance;Decreased knowledge of use of DME or AE;Impaired UE functional use   OT Treatment/Interventions: Self-care/ADL training;Therapeutic exercise;Neuromuscular education;Patient/family education;Therapeutic activities;Energy conservation    OT Goals(Current goals can be found in the care plan section) ADL Goals Additional ADL Goal #1: Pt. will be independent with a HEP for the RUE and hand strength, and coordination.  OT Frequency: Min 1X/week   Barriers to D/C:            Co-evaluation              End of Session    Activity Tolerance: Patient tolerated treatment well Patient left: in bed;with call bell/phone within reach;with bed alarm set   Time: 0177-9390 OT Time Calculation (min):  30 min Charges:  OT General Charges $OT Visit: 1 Procedure OT Evaluation $OT Eval Moderate Complexity: 1 Procedure G-Codes: OT G-codes **NOT FOR INPATIENT CLASS** Functional Assessment Tool Used: clinical judgment based on pt. current functional status. Functional Limitation: Self care Self Care Current Status [phone]): At least 20 percent but less than 40 percent impaired, limited or restricted Self Care Goal Status (U6333): At least 1 percent but less than 20 percent impaired, limited or restricted  Harrel Carina, MS, OTR/L 04/15/2016, 4:16  PM

## 2016-04-15 NOTE — Progress Notes (Signed)
Speech Therapy Note: received order, reviewed chart notes. Consulted NSG then pt and family. Pt denied any difficulty swallowing now as the s/s resolved she said. Pt is currently on a regular diet; tolerates swallowing pills w/ water per NSG. Pt conversed at conversational level w/out significant deficits noted; pt did slow her speech a little intermittently. Offered information for referral for f/u w/ Outpatient ST but pt and family denied need for any ST services at this time. Recommended f/u w/ primary MD for anything further if needed. Pt agreed. No further skilled ST services indicated as pt appears at her baseline. Pt agreed. NSG to reconsult if any change in status.

## 2016-04-15 NOTE — Care Management Obs Status (Signed)
[facility] NOTIFICATION   Patient Details  Name: Alena Blankenbeckler MRN: [id number] Date of Birth: 01/12/1951   Medicare Observation Status Notification Given:  Yes    Shelbie Ammons, RN 04/15/2016, 8:09 AM

## 2016-04-15 NOTE — Progress Notes (Signed)
Inpatient Diabetes Program Recommendations  AACE/ADA: New Consensus Statement on Inpatient Glycemic Control (2015)  Target Ranges:  Prepandial:   less than 140 mg/dL      Peak postprandial:   less than 180 mg/dL (1-2 hours)      Critically ill patients:  140 - 180 mg/dL   Lab Results  Component Value Date   GLUCAP 122 (H) 04/15/2016   HGBA1C 6.9 (H) 08/09/2015    Review of Glycemic Control:  Results for SHALICE, WOODRING (MRN [id number]) as of 04/15/2016 11:30  Ref. Range 04/14/2016 11:16 04/14/2016 17:26 04/14/2016 21:17 04/15/2016 07:51  Glucose-Capillary Latest Ref Range: 65 - 99 mg/dL 127 (H) 164 (H) 119 (H) 122 (H)   Referral received and chart reviewed. Diabetes history: Type 2 Diabetes Outpatient Diabetes medications: Novolin 70/30 42 units bid  Current orders for Inpatient glycemic control:  Novolog resistant tid with meals and HS, Novolog 70/30 mix 25 units bid Inpatient Diabetes Program Recommendations:    Blood sugars well controlled on reduced insulin dose in the hospital.  No recommendations at this time. Will follow.  Thanks, Adah Perl, RN, BC-ADM Inpatient Diabetes Coordinator Pager [phone] (8a-5p)

## 2016-04-15 NOTE — Care Management (Addendum)
Admitted to this facility under observation status with the diagnosis Stroke. Lives with husband Patrica Duel [phone]) x 16 years.  Last seen Dr. Marilynn Rail unsure. "Need to make an appointment."No home Health. No skilled facility. No home oxygen. Takes care of all basic and instrumental activities of daily living herself, doesn't drive. No falls. Fair appetite. Prescriptions are filled at [facility] in [place]. Physical therapy recommending home with home health and physical therapy. [place]. [facility] representative updated. Discharge to home per Dr. Fritzi Mandes Shelbie Ammons RN MSN CCM Care Management [phone]

## 2016-04-15 NOTE — Plan of Care (Signed)
Problem: Education: Goal: Knowledge of secondary prevention will improve Outcome: Progressing DM educator pending

## 2016-04-16 LAB — HEMOGLOBIN A1C
Hgb A1c MFr Bld: 5.8 % — ABNORMAL HIGH (ref 4.8–5.6)
Mean Plasma Glucose: 120 mg/dL

## 2016-05-18 ENCOUNTER — Encounter (INDEPENDENT_AMBULATORY_CARE_PROVIDER_SITE_OTHER): Payer: Medicare HMO

## 2016-05-18 ENCOUNTER — Other Ambulatory Visit (INDEPENDENT_AMBULATORY_CARE_PROVIDER_SITE_OTHER): Payer: Self-pay | Admitting: Vascular Surgery

## 2016-05-18 ENCOUNTER — Ambulatory Visit (INDEPENDENT_AMBULATORY_CARE_PROVIDER_SITE_OTHER): Payer: Self-pay | Admitting: Vascular Surgery

## 2016-05-18 DIAGNOSIS — Z992 Dependence on renal dialysis: Secondary | ICD-10-CM

## 2016-05-18 DIAGNOSIS — N186 End stage renal disease: Secondary | ICD-10-CM

## 2016-05-18 DIAGNOSIS — T829XXA Unspecified complication of cardiac and vascular prosthetic device, implant and graft, initial encounter: Secondary | ICD-10-CM

## 2016-06-08 ENCOUNTER — Other Ambulatory Visit (INDEPENDENT_AMBULATORY_CARE_PROVIDER_SITE_OTHER): Payer: Self-pay | Admitting: Vascular Surgery

## 2016-06-14 NOTE — Telephone Encounter (Signed)
Refill done.  

## 2016-09-20 ENCOUNTER — Other Ambulatory Visit: Payer: Self-pay | Admitting: Pediatrics

## 2016-09-20 DIAGNOSIS — R928 Other abnormal and inconclusive findings on diagnostic imaging of breast: Secondary | ICD-10-CM

## 2016-09-20 DIAGNOSIS — Z780 Asymptomatic menopausal state: Secondary | ICD-10-CM

## 2016-12-12 ENCOUNTER — Other Ambulatory Visit (INDEPENDENT_AMBULATORY_CARE_PROVIDER_SITE_OTHER): Payer: Self-pay | Admitting: Vascular Surgery

## 2016-12-12 ENCOUNTER — Encounter (INDEPENDENT_AMBULATORY_CARE_PROVIDER_SITE_OTHER): Payer: Self-pay

## 2016-12-19 MED ORDER — CEFAZOLIN SODIUM-DEXTROSE 1-4 GM/50ML-% IV SOLN: 1.0000 g | Freq: Once | INTRAVENOUS | Status: DC

## 2016-12-20 ENCOUNTER — Encounter: Payer: Self-pay | Admitting: *Deleted

## 2016-12-20 ENCOUNTER — Ambulatory Visit
Admission: RE | Admit: 2016-12-20 | Discharge: 2016-12-20 | Disposition: A | Discharge status: Home / Self Care | Payer: Medicare HMO | Source: Ambulatory Visit | Attending: Vascular Surgery | Admitting: Vascular Surgery

## 2016-12-20 ENCOUNTER — Encounter
Admission: RE | Disposition: A | Discharge status: Home / Self Care | Payer: Self-pay | Source: Ambulatory Visit | Attending: Vascular Surgery

## 2016-12-20 DIAGNOSIS — Z992 Dependence on renal dialysis: Secondary | ICD-10-CM | POA: Diagnosis not present

## 2016-12-20 DIAGNOSIS — I10 Essential (primary) hypertension: Secondary | ICD-10-CM | POA: Diagnosis not present

## 2016-12-20 DIAGNOSIS — Z87891 Personal history of nicotine dependence: Secondary | ICD-10-CM | POA: Insufficient documentation

## 2016-12-20 DIAGNOSIS — E119 Type 2 diabetes mellitus without complications: Secondary | ICD-10-CM | POA: Diagnosis not present

## 2016-12-20 DIAGNOSIS — N186 End stage renal disease: Secondary | ICD-10-CM | POA: Diagnosis not present

## 2016-12-20 DIAGNOSIS — I120 Hypertensive chronic kidney disease with stage 5 chronic kidney disease or end stage renal disease: Secondary | ICD-10-CM | POA: Diagnosis not present

## 2016-12-20 DIAGNOSIS — E1122 Type 2 diabetes mellitus with diabetic chronic kidney disease: Secondary | ICD-10-CM | POA: Insufficient documentation

## 2016-12-20 DIAGNOSIS — T82868A Thrombosis of vascular prosthetic devices, implants and grafts, initial encounter: Secondary | ICD-10-CM | POA: Diagnosis not present

## 2016-12-20 DIAGNOSIS — Y832 Surgical operation with anastomosis, bypass or graft as the cause of abnormal reaction of the patient, or of later complication, without mention of misadventure at the time of the procedure: Secondary | ICD-10-CM | POA: Insufficient documentation

## 2016-12-20 DIAGNOSIS — E785 Hyperlipidemia, unspecified: Secondary | ICD-10-CM | POA: Insufficient documentation

## 2016-12-20 DIAGNOSIS — Z9109 Other allergy status, other than to drugs and biological substances: Secondary | ICD-10-CM | POA: Insufficient documentation

## 2016-12-20 DIAGNOSIS — J449 Chronic obstructive pulmonary disease, unspecified: Secondary | ICD-10-CM | POA: Insufficient documentation

## 2016-12-20 DIAGNOSIS — I2510 Atherosclerotic heart disease of native coronary artery without angina pectoris: Secondary | ICD-10-CM | POA: Insufficient documentation

## 2016-12-20 DIAGNOSIS — T82858A Stenosis of vascular prosthetic devices, implants and grafts, initial encounter: Secondary | ICD-10-CM | POA: Diagnosis not present

## 2016-12-20 HISTORY — PX: A/V FISTULAGRAM: CATH118298

## 2016-12-20 HISTORY — PX: A/V SHUNT INTERVENTION: CATH118220

## 2016-12-20 LAB — POTASSIUM (ARMC VASCULAR LAB ONLY): Potassium (ARMC vascular lab): 4.4 (ref 3.5–5.1)

## 2016-12-20 SURGERY — A/V FISTULAGRAM
Anesthesia: Moderate Sedation

## 2016-12-20 MED ORDER — HEPARIN (PORCINE) IN NACL 2-0.9 UNIT/ML-% IJ SOLN
INTRAMUSCULAR | Status: AC
  Filled 2016-12-20: qty 500

## 2016-12-20 MED ORDER — HEPARIN SODIUM (PORCINE) 1000 UNIT/ML IJ SOLN
INTRAMUSCULAR | Status: DC | PRN
  Administered 2016-12-20: 3000 [IU] via INTRAVENOUS

## 2016-12-20 MED ORDER — CEFAZOLIN SODIUM-DEXTROSE 2-4 GM/100ML-% IV SOLN
2.0000 g | Freq: Once | INTRAVENOUS | Status: AC
  Administered 2016-12-20: 2 g via INTRAVENOUS

## 2016-12-20 MED ORDER — FENTANYL CITRATE (PF) 100 MCG/2ML IJ SOLN
INTRAMUSCULAR | Status: DC | PRN
  Administered 2016-12-20: 50 ug via INTRAVENOUS
  Administered 2016-12-20: 25 ug via INTRAVENOUS

## 2016-12-20 MED ORDER — ONDANSETRON HCL 4 MG/2ML IJ SOLN: 4.0000 mg | Freq: Four times a day (QID) | INTRAMUSCULAR | Status: DC | PRN

## 2016-12-20 MED ORDER — METHYLPREDNISOLONE SODIUM SUCC 125 MG IJ SOLR: 125.0000 mg | INTRAMUSCULAR | Status: DC | PRN

## 2016-12-20 MED ORDER — SODIUM CHLORIDE 0.9 % IV SOLN: INTRAVENOUS | Status: DC

## 2016-12-20 MED ORDER — FENTANYL CITRATE (PF) 100 MCG/2ML IJ SOLN
INTRAMUSCULAR | Status: AC
  Filled 2016-12-20: qty 2

## 2016-12-20 MED ORDER — FAMOTIDINE 20 MG PO TABS: 40.0000 mg | ORAL_TABLET | ORAL | Status: DC | PRN

## 2016-12-20 MED ORDER — MIDAZOLAM HCL 2 MG/2ML IJ SOLN
INTRAMUSCULAR | Status: DC | PRN
  Administered 2016-12-20: 2 mg via INTRAVENOUS

## 2016-12-20 MED ORDER — LIDOCAINE HCL (PF) 1 % IJ SOLN
INTRAMUSCULAR | Status: AC
  Filled 2016-12-20: qty 10

## 2016-12-20 MED ORDER — HYDROMORPHONE HCL 1 MG/ML IJ SOLN: 1.0000 mg | Freq: Once | INTRAMUSCULAR | Status: DC | PRN

## 2016-12-20 MED ORDER — IOPAMIDOL (ISOVUE-300) INJECTION 61%
INTRAVENOUS | Status: DC | PRN
  Administered 2016-12-20: 30 mL via INTRA_ARTERIAL

## 2016-12-20 MED ORDER — MIDAZOLAM HCL 5 MG/5ML IJ SOLN
INTRAMUSCULAR | Status: AC
  Filled 2016-12-20: qty 5

## 2016-12-20 SURGICAL SUPPLY — 14 items
BALLN DORADO 7X40X80 (BALLOONS) ×3
BALLN DORADO 8X100X80 (BALLOONS) ×3
BALLOON DORADO 7X40X80 (BALLOONS) ×2 IMPLANT
BALLOON DORADO 8X100X80 (BALLOONS) ×2 IMPLANT
COVER PROBE U/S 5X48 (MISCELLANEOUS) ×3 IMPLANT
DEVICE PRESTO INFLATION (MISCELLANEOUS) ×3 IMPLANT
DRAPE BRACHIAL (DRAPES) ×3 IMPLANT
NEEDLE ENTRY 21GA 7CM ECHOTIP (NEEDLE) ×6 IMPLANT
PACK ANGIOGRAPHY (CUSTOM PROCEDURE TRAY) ×3 IMPLANT
SET INTRO CAPELLA COAXIAL (SET/KITS/TRAYS/PACK) ×3 IMPLANT
SHEATH BRITE TIP 6FRX5.5 (SHEATH) ×6 IMPLANT
SHIELD RADPAD DADD DRAPE 4X9 (MISCELLANEOUS) ×3 IMPLANT
SUT MNCRL AB 4-0 PS2 18 (SUTURE) ×3 IMPLANT
WIRE MAGIC TOR.035 180C (WIRE) ×3 IMPLANT

## 2016-12-20 NOTE — H&P (Signed)
[facility] Admission History & Physical  MRN : [id number]  Cynthia Dean is a 66 y.o. (July 22, 1951) female who presents with chief complaint of my access isn't working right.  History of Present Illness: I am asked to evaluate the patient by the dialysis center. The patient was sent here because they were unable to achieve adequate dialysis this morning. Furthermore the Center states there is very poor thrill and bruit. The patient states there there have been increasing problems with the access, such as "pulling clots" during dialysis and prolonged bleeding after decannulation particularly the arterial needle. The patient estimates these problems have been going on for several weeks. The patient is unaware of any other change.  Patient denies pain or tenderness overlying the access.  There is no pain with dialysis.  The patient denies hand pain or finger pain consistent with steal syndrome.   There have not been  any past interventions or declots of this access.  The patient is not chronically hypotensive on dialysis.  Current Facility-Administered Medications  Medication Dose Route Frequency Provider Last Rate Last Dose  . 0.9 %  sodium chloride infusion   Intravenous Continuous Stegmayer, Kimberly A, PA-C      . ceFAZolin (ANCEF) IVPB 2g/100 mL premix  2 g Intravenous Once Algernon Huxley, MD      . famotidine (PEPCID) tablet 40 mg  40 mg Oral PRN Stegmayer, Janalyn Harder, PA-C      . HYDROmorphone (DILAUDID) injection 1 mg  1 mg Intravenous Once PRN Stegmayer, Kimberly A, PA-C      . methylPREDNISolone sodium succinate (SOLU-MEDROL) 125 mg/2 mL injection 125 mg  125 mg Intravenous PRN Stegmayer, Kimberly A, PA-C      . ondansetron (ZOFRAN) injection 4 mg  4 mg Intravenous Q6H PRN Stegmayer, Janalyn Harder, PA-C        Past Medical History:  Diagnosis Date  . Chronic kidney disease   . COPD (chronic obstructive pulmonary disease) ([place])   . Coronary artery  disease   . Diabetes mellitus without complication ([place])   . Heart murmur   . Hyperlipidemia   . Hypertension     Past Surgical History:  Procedure Laterality Date  . BREAST BIOPSY Bilateral 07/19/00   neg  . PERIPHERAL VASCULAR CATHETERIZATION Left 02/02/2015   Procedure: A/V Shuntogram/Fistulagram;  Surgeon: Algernon Huxley, MD;  Location: [facility];  Service: Cardiovascular;  Laterality: Left;  . PERIPHERAL VASCULAR CATHETERIZATION Left 02/02/2015   Procedure: A/V Shunt Intervention;  Surgeon: Algernon Huxley, MD;  Location: [facility];  Service: Cardiovascular;  Laterality: Left;  . PERIPHERAL VASCULAR CATHETERIZATION Left 03/09/2015   Procedure: A/V Shuntogram/Fistulagram;  Surgeon: Algernon Huxley, MD;  Location: [facility] [facility];  Service: Cardiovascular;  Laterality: Left;  . PERIPHERAL VASCULAR CATHETERIZATION N/A 03/09/2015   Procedure: A/V Shunt Intervention;  Surgeon: Algernon Huxley, MD;  Location: [facility];  Service: Cardiovascular;  Laterality: N/A;  . PERIPHERAL VASCULAR CATHETERIZATION Left 04/17/2015   Procedure: Upper Extremity Angiography;  Surgeon: Algernon Huxley, MD;  Location: [place] [facility];  Service: Cardiovascular;  Laterality: Left;  . PERIPHERAL VASCULAR CATHETERIZATION  04/17/2015   Procedure: Upper Extremity Intervention;  Surgeon: Algernon Huxley, MD;  Location: [place] [facility];  Service: Cardiovascular;;  . PERIPHERAL VASCULAR CATHETERIZATION N/A 08/10/2015   Procedure: A/V Shuntogram/Fistulagram;  Surgeon: Algernon Huxley, MD;  Location: [place] [facility];  Service: Cardiovascular;  Laterality: N/A;  .  PERIPHERAL VASCULAR CATHETERIZATION N/A 08/10/2015   Procedure: A/V Shunt Intervention;  Surgeon: Algernon Huxley, MD;  Location: [facility];  Service: Cardiovascular;  Laterality: N/A;  . PERIPHERAL VASCULAR CATHETERIZATION Left 04/11/2016   Procedure: A/V Shuntogram/Fistulagram;  Surgeon: Algernon Huxley, MD;  Location: [facility];  Service: Cardiovascular;  Laterality: Left;    Social History Social History  Substance Use Topics  . Smoking status: Former Smoker    Packs/day: 0.00    Types: Cigarettes  . Smokeless tobacco: Never Used  . Alcohol use No    Family History Family History  Problem Relation Age of Onset  . Breast cancer Father 78  . Stroke Mother   . Heart attack Mother   . Heart Problems Sister   . Breast cancer Cousin 41       1 st cousin. Maternal     No family history of bleeding or clotting disorders, autoimmune disease or porphyria  Allergies  Allergen Reactions  . Tape Itching    Skin Dermatitis/itching (tape adhesive)     REVIEW OF SYSTEMS (Negative unless checked)  Constitutional: [] Weight loss  [] Fever  [] Chills Cardiac: [] Chest pain   [] Chest pressure   [] Palpitations   [] Shortness of breath when laying flat   [] Shortness of breath at rest   [x] Shortness of breath with exertion. Vascular:  [] Pain in legs with walking   [] Pain in legs at rest   [] Pain in legs when laying flat   [] Claudication   [] Pain in feet when walking  [] Pain in feet at rest  [] Pain in feet when laying flat   [] History of DVT   [] Phlebitis   [] Swelling in legs   [] Varicose veins   [] Non-healing ulcers Pulmonary:   [] Uses home oxygen   [] Productive cough   [] Hemoptysis   [] Wheeze  [] COPD   [] Asthma Neurologic:  [] Dizziness  [] Blackouts   [] Seizures   [] History of stroke   [] History of TIA  [] Aphasia   [] Temporary blindness   [] Dysphagia   [] Weakness or numbness in arms   [] Weakness or numbness in legs Musculoskeletal:  [] Arthritis   [] Joint swelling   [] Joint pain   [] Low back pain Hematologic:  [] Easy bruising  [] Easy bleeding   [] Hypercoagulable state   [] Anemic  [] Hepatitis Gastrointestinal:  [] Blood in stool   [] Vomiting blood  [] Gastroesophageal reflux/heartburn   [] Difficulty swallowing. Genitourinary:  [x] Chronic kidney disease   [] Difficult urination  [] Frequent urination  [] Burning with urination    [] Blood in urine Skin:  [] Rashes   [] Ulcers   [] Wounds Psychological:  [] History of anxiety   []  History of major depression.  Physical Examination  Vitals:   12/20/16 1320  BP: (!) 178/81  Pulse: 75  Resp: 15  Temp: 97.7 F (36.5 C)  TempSrc: Oral  SpO2: 97%  Weight: 135.2 kg (298 lb)  Height: 5\' 7"  (1.702 m)   Body mass index is 46.67 kg/m. Gen: WD/WN, NAD Head: [place]/AT, No temporalis wasting. Prominent temp pulse not noted. Ear/Nose/Throat: Hearing grossly intact, nares w/o erythema or drainage, oropharynx w/o Erythema/Exudate,  Eyes: Conjunctiva clear, sclera non-icteric Neck: Trachea midline.  No JVD.  Pulmonary:  Good air movement, respirations not labored, no use of accessory muscles.  Cardiac: RRR, normal S1, S2. Vascular: left brachial axillary AV graft is strongly pulsatile with a poor thrill Vessel Right Left  Radial Palpable Palpable  Ulnar Not Palpable Not Palpable  Brachial Palpable Palpable  Carotid Palpable, without bruit Palpable, without bruit  Gastrointestinal: soft,  non-tender/non-distended. No guarding/reflex.  Musculoskeletal: M/S 5/5 throughout.  Extremities without ischemic changes.  No deformity or atrophy.  Neurologic: Sensation grossly intact in extremities.  Symmetrical.  Speech is fluent. Motor exam as listed above. Psychiatric: Judgment intact, Mood & affect appropriate for pt's clinical situation. Dermatologic: No rashes or ulcers noted.  No cellulitis or open wounds. Lymph : No Cervical, Axillary, or Inguinal lymphadenopathy.   CBC Lab Results  Component Value Date   WBC 7.9 04/14/2016   HGB 12.6 04/14/2016   HCT 38.2 04/14/2016   MCV 87.8 04/14/2016   PLT 229 04/14/2016    BMET    Component Value Date/Time   NA 132 (L) 04/14/2016 1119   NA 135 (L) 06/16/2014 0915   K 4.0 04/14/2016 1119   K 4.8 06/16/2014 0915   CL 98 (L) 04/14/2016 1119   CL 100 06/16/2014 0915   CO2 24 04/14/2016 1119   CO2 22 06/16/2014 0915   GLUCOSE 124  (H) 04/14/2016 1119   GLUCOSE 226 (H) 06/16/2014 0915   BUN 19 04/14/2016 1119   BUN 47 (H) 06/16/2014 0915   CREATININE 5.51 (H) 04/14/2016 1119   CREATININE 7.60 (H) 06/16/2014 0915   CALCIUM 8.3 (L) 04/14/2016 1119   CALCIUM 7.2 (L) 06/16/2014 0915   GFRNONAA 7 (L) 04/14/2016 1119   GFRNONAA 6 (L) 06/16/2014 0915   GFRNONAA 6 (L) 04/07/2014 0918   GFRAA 9 (L) 04/14/2016 1119   GFRAA 7 (L) 06/16/2014 0915   GFRAA 7 (L) 04/07/2014 0918   CrCl cannot be calculated (Patient's most recent lab result is older than the maximum 21 days allowed.).  COAG Lab Results  Component Value Date   INR 1.0 06/17/2014   INR 1.2 12/15/2013   INR 1.0 09/01/2011    Radiology No results found.  Assessment/Plan 1.  Complication dialysis device with thrombosis AV access:  Patient's left arm dialysis access is malfunctioning. The patient will undergo angiography and correction of any problems using interventional techniques with the hope of restoring function to the access.  The risks and benefits were described to the patient.  All questions were answered.  The patient agrees to proceed with angiography and intervention. Potassium will be drawn to ensure that it is an appropriate level prior to performing intervention. 2.  End-stage renal disease requiring hemodialysis:  Patient will continue dialysis therapy without further interruption if a successful intervention is not achieved then a tunneled catheter will be placed. Dialysis has already been arranged. 3.  Hypertension:  Patient will continue medical management; nephrology is following no changes in oral medications. 4. Diabetes mellitus:  Glucose will be monitored and oral medications been held this morning once the patient has undergone the patient's procedure po intake will be reinitiated and again Accu-Cheks will be used to assess the blood glucose level and treat as needed. The patient will be restarted on the patient's usual hypoglycemic  regime 5.  Coronary artery disease:  EKG will be monitored. Nitrates will be used if needed. The patient's oral cardiac medications will be continued.    Hortencia Pilar, MD  12/20/2016 1:23 PM

## 2016-12-20 NOTE — H&P (Signed)
OPERATIVE NOTE   PROCEDURE: 1. Contrast injection left arm  AV access 2. Percutaneous transluminal angioplasty to 8 mm left arm AV access  PRE-OPERATIVE DIAGNOSIS: Complication of dialysis access                                                       End Stage Renal Disease  POST-OPERATIVE DIAGNOSIS: same as above   SURGEON: Katha Cabal, M.D.  ANESTHESIA: Conscious sedation was administered under my direct supervision by the interventional radiology RN. IV Versed plus fentanyl were utilized. Continuous ECG, pulse oximetry and blood pressure was monitored throughout the entire procedure.  Conscious sedation was for a total of 26 minutes.  ESTIMATED BLOOD LOSS: minimal  FINDING(S): Stricture of the AV graft  SPECIMEN(S):  None  CONTRAST: 30 cc  FLUOROSCOPY TIME: 2.7 minutes  INDICATIONS: Cynthia Dean is a 66 y.o. female who  presents with malfunctioning left arm AV access.  The patient is scheduled for angiography with possible intervention of the AV access.  The patient is aware the risks include but are not limited to: bleeding, infection, thrombosis of the cannulated access, and possible anaphylactic reaction to the contrast.  The patient acknowledges if the access can not be salvaged a tunneled catheter will be needed and will be placed during this procedure.  The patient is aware of the risks of the procedure and elects to proceed with the angiogram and intervention.  DESCRIPTION: After full informed written consent was obtained, the patient was brought back to the Special Procedure suite and placed supine position.  Appropriate cardiopulmonary monitors were placed.  The left arm was prepped and draped in the standard fashion.  Appropriate timeout is called. The left arm AV access  was cannulated with a micropuncture needle.  Cannulation was performed with ultrasound guidance. Ultrasound was placed in a sterile sleeve, the AV access was interrogated and noted to be  echolucent and compressible indicating patency. Image was recorded for the permanent record. The puncture is performed under continuous ultrasound visualization.   The microwire was advanced and the needle was exchanged for  a microsheath.  The J-wire was then advanced and a 6 Fr sheath inserted.  Hand injections were completed to image the access from the arterial anastomosis through the entire access.  The central venous structures were also imaged by hand injections.  Based on the images,  3000 units of heparin was given and a wire was negotiated through the strictures within the venous portion of the graft.  An 8 mm x 100 mm Dorado balloon was used.  Inflation was to 28 atm.  Follow-up imaging demonstrated an area more close to the anastomosis and this was treated with a 7 mm x 40 mm Dorado balloon inflated to 18 atm  Follow-up imaging demonstrates complete resolution of the stricture with rapid flow of contrast through the graft, the central venous anatomy is preserved.  A 4-0 Monocryl purse-string suture was sewn around the sheath.  The sheath was removed and light pressure was applied.  A sterile bandage was applied to the puncture site.    COMPLICATIONS: None  CONDITION: Cynthia Dean, M.D [facility] Office: [phone]  12/20/2016 2:46 PM

## 2016-12-21 ENCOUNTER — Encounter: Payer: Self-pay | Admitting: Vascular Surgery

## 2017-02-09 ENCOUNTER — Encounter (INDEPENDENT_AMBULATORY_CARE_PROVIDER_SITE_OTHER): Payer: Medicare HMO

## 2017-02-09 ENCOUNTER — Encounter (INDEPENDENT_AMBULATORY_CARE_PROVIDER_SITE_OTHER): Payer: Self-pay

## 2017-02-09 ENCOUNTER — Ambulatory Visit (INDEPENDENT_AMBULATORY_CARE_PROVIDER_SITE_OTHER): Payer: Medicare HMO | Admitting: Vascular Surgery

## 2017-06-27 ENCOUNTER — Encounter: Payer: Self-pay | Admitting: *Deleted

## 2017-06-27 ENCOUNTER — Other Ambulatory Visit: Payer: Self-pay

## 2017-06-27 ENCOUNTER — Inpatient Hospital Stay
Admission: EM | Admit: 2017-06-27 | Discharge: 2017-07-01 | DRG: 064 | Disposition: A | Discharge status: Home Health | Payer: Medicare HMO | Attending: Internal Medicine | Admitting: Internal Medicine

## 2017-06-27 ENCOUNTER — Emergency Department: Payer: Medicare HMO

## 2017-06-27 ENCOUNTER — Inpatient Hospital Stay: Payer: Medicare HMO

## 2017-06-27 ENCOUNTER — Encounter: Payer: Self-pay | Admitting: Emergency Medicine

## 2017-06-27 ENCOUNTER — Ambulatory Visit (INDEPENDENT_AMBULATORY_CARE_PROVIDER_SITE_OTHER)
Admission: EM | Admit: 2017-06-27 | Discharge: 2017-06-27 | Disposition: A | Discharge status: Hospice | Payer: Medicare HMO | Source: Home / Self Care | Attending: Family Medicine | Admitting: Family Medicine

## 2017-06-27 DIAGNOSIS — K3184 Gastroparesis: Secondary | ICD-10-CM | POA: Diagnosis present

## 2017-06-27 DIAGNOSIS — G4733 Obstructive sleep apnea (adult) (pediatric): Secondary | ICD-10-CM | POA: Diagnosis present

## 2017-06-27 DIAGNOSIS — N2581 Secondary hyperparathyroidism of renal origin: Secondary | ICD-10-CM | POA: Diagnosis present

## 2017-06-27 DIAGNOSIS — I5032 Chronic diastolic (congestive) heart failure: Secondary | ICD-10-CM | POA: Diagnosis present

## 2017-06-27 DIAGNOSIS — K449 Diaphragmatic hernia without obstruction or gangrene: Secondary | ICD-10-CM | POA: Diagnosis present

## 2017-06-27 DIAGNOSIS — K219 Gastro-esophageal reflux disease without esophagitis: Secondary | ICD-10-CM | POA: Diagnosis present

## 2017-06-27 DIAGNOSIS — Z8673 Personal history of transient ischemic attack (TIA), and cerebral infarction without residual deficits: Secondary | ICD-10-CM

## 2017-06-27 DIAGNOSIS — I2510 Atherosclerotic heart disease of native coronary artery without angina pectoris: Secondary | ICD-10-CM | POA: Diagnosis present

## 2017-06-27 DIAGNOSIS — E785 Hyperlipidemia, unspecified: Secondary | ICD-10-CM | POA: Diagnosis present

## 2017-06-27 DIAGNOSIS — I639 Cerebral infarction, unspecified: Principal | ICD-10-CM

## 2017-06-27 DIAGNOSIS — Z7982 Long term (current) use of aspirin: Secondary | ICD-10-CM | POA: Diagnosis not present

## 2017-06-27 DIAGNOSIS — K920 Hematemesis: Secondary | ICD-10-CM | POA: Diagnosis present

## 2017-06-27 DIAGNOSIS — N186 End stage renal disease: Secondary | ICD-10-CM | POA: Diagnosis present

## 2017-06-27 DIAGNOSIS — Z7902 Long term (current) use of antithrombotics/antiplatelets: Secondary | ICD-10-CM | POA: Diagnosis not present

## 2017-06-27 DIAGNOSIS — Z794 Long term (current) use of insulin: Secondary | ICD-10-CM

## 2017-06-27 DIAGNOSIS — R109 Unspecified abdominal pain: Secondary | ICD-10-CM | POA: Diagnosis not present

## 2017-06-27 DIAGNOSIS — E1122 Type 2 diabetes mellitus with diabetic chronic kidney disease: Secondary | ICD-10-CM

## 2017-06-27 DIAGNOSIS — Z87891 Personal history of nicotine dependence: Secondary | ICD-10-CM | POA: Diagnosis not present

## 2017-06-27 DIAGNOSIS — G43A0 Cyclical vomiting, not intractable: Secondary | ICD-10-CM | POA: Diagnosis not present

## 2017-06-27 DIAGNOSIS — R29700 NIHSS score 0: Secondary | ICD-10-CM | POA: Diagnosis present

## 2017-06-27 DIAGNOSIS — R4701 Aphasia: Secondary | ICD-10-CM | POA: Diagnosis present

## 2017-06-27 DIAGNOSIS — Z992 Dependence on renal dialysis: Secondary | ICD-10-CM

## 2017-06-27 DIAGNOSIS — I674 Hypertensive encephalopathy: Secondary | ICD-10-CM | POA: Diagnosis present

## 2017-06-27 DIAGNOSIS — J449 Chronic obstructive pulmonary disease, unspecified: Secondary | ICD-10-CM | POA: Diagnosis present

## 2017-06-27 DIAGNOSIS — Z23 Encounter for immunization: Secondary | ICD-10-CM

## 2017-06-27 DIAGNOSIS — E1143 Type 2 diabetes mellitus with diabetic autonomic (poly)neuropathy: Secondary | ICD-10-CM | POA: Diagnosis present

## 2017-06-27 DIAGNOSIS — R197 Diarrhea, unspecified: Secondary | ICD-10-CM

## 2017-06-27 DIAGNOSIS — I132 Hypertensive heart and chronic kidney disease with heart failure and with stage 5 chronic kidney disease, or end stage renal disease: Secondary | ICD-10-CM | POA: Diagnosis present

## 2017-06-27 DIAGNOSIS — I161 Hypertensive emergency: Secondary | ICD-10-CM | POA: Diagnosis not present

## 2017-06-27 DIAGNOSIS — D631 Anemia in chronic kidney disease: Secondary | ICD-10-CM | POA: Diagnosis present

## 2017-06-27 DIAGNOSIS — R112 Nausea with vomiting, unspecified: Secondary | ICD-10-CM | POA: Diagnosis present

## 2017-06-27 DIAGNOSIS — Z79899 Other long term (current) drug therapy: Secondary | ICD-10-CM | POA: Diagnosis not present

## 2017-06-27 DIAGNOSIS — E11319 Type 2 diabetes mellitus with unspecified diabetic retinopathy without macular edema: Secondary | ICD-10-CM | POA: Diagnosis present

## 2017-06-27 DIAGNOSIS — Z6841 Body Mass Index (BMI) 40.0 and over, adult: Secondary | ICD-10-CM | POA: Diagnosis not present

## 2017-06-27 HISTORY — DX: Hematemesis: K92.0

## 2017-06-27 LAB — COMPREHENSIVE METABOLIC PANEL
ALT: 17 U/L (ref 14–54)
ANION GAP: 19 — AB (ref 5–15)
AST: 26 U/L (ref 15–41)
Albumin: 4.1 g/dL (ref 3.5–5.0)
Alkaline Phosphatase: 91 U/L (ref 38–126)
BUN: 39 mg/dL — ABNORMAL HIGH (ref 6–20)
CHLORIDE: 96 mmol/L — AB (ref 101–111)
CO2: 27 mmol/L (ref 22–32)
Calcium: 10.1 mg/dL (ref 8.9–10.3)
Creatinine, Ser: 7.77 mg/dL — ABNORMAL HIGH (ref 0.44–1.00)
GFR, EST AFRICAN AMERICAN: 6 mL/min — AB (ref >60)
GFR, EST NON AFRICAN AMERICAN: 5 mL/min — AB (ref >60)
Glucose, Bld: 127 mg/dL — ABNORMAL HIGH (ref 65–99)
POTASSIUM: 3.7 mmol/L (ref 3.5–5.1)
Sodium: 142 mmol/L (ref 135–145)
Total Bilirubin: 0.7 mg/dL (ref 0.3–1.2)
Total Protein: 8.4 g/dL — ABNORMAL HIGH (ref 6.5–8.1)

## 2017-06-27 LAB — CBC WITH DIFFERENTIAL/PLATELET
BASOS ABS: 0.1 10*3/uL (ref 0–0.1)
BASOS PCT: 1 %
Eosinophils Absolute: 0 10*3/uL (ref 0–0.7)
Eosinophils Relative: 0 %
HEMATOCRIT: 35.7 % (ref 35.0–47.0)
Hemoglobin: 11.7 g/dL — ABNORMAL LOW (ref 12.0–16.0)
Lymphocytes Relative: 7 %
Lymphs Abs: 0.9 10*3/uL — ABNORMAL LOW (ref 1.0–3.6)
MCH: 29.1 pg (ref 26.0–34.0)
MCHC: 32.8 g/dL (ref 32.0–36.0)
MCV: 88.6 fL (ref 80.0–100.0)
MONO ABS: 0.5 10*3/uL (ref 0.2–0.9)
Monocytes Relative: 4 %
NEUTROS ABS: 11.2 10*3/uL — AB (ref 1.4–6.5)
NEUTROS PCT: 88 %
Platelets: 254 10*3/uL (ref 150–440)
RBC: 4.02 MIL/uL (ref 3.80–5.20)
RDW: 14.6 % — AB (ref 11.5–14.5)
WBC: 12.7 10*3/uL — ABNORMAL HIGH (ref 3.6–11.0)

## 2017-06-27 LAB — URINALYSIS, COMPLETE (UACMP) WITH MICROSCOPIC
Bacteria, UA: NONE SEEN
Bilirubin Urine: NEGATIVE
Hgb urine dipstick: NEGATIVE
Ketones, ur: NEGATIVE mg/dL
Leukocytes, UA: NEGATIVE
Nitrite: NEGATIVE
PH: 9 — AB (ref 5.0–8.0)
Protein, ur: >=300 mg/dL — AB
Specific Gravity, Urine: 1.006 (ref 1.005–1.030)

## 2017-06-27 LAB — TROPONIN I: TROPONIN I: 0.07 ng/mL — AB (ref <0.03)

## 2017-06-27 LAB — GLUCOSE, CAPILLARY
GLUCOSE-CAPILLARY: 154 mg/dL — AB (ref 65–99)
Glucose-Capillary: 158 mg/dL — ABNORMAL HIGH (ref 65–99)

## 2017-06-27 LAB — MRSA PCR SCREENING: MRSA by PCR: NEGATIVE

## 2017-06-27 LAB — LIPASE, BLOOD: LIPASE: 39 U/L (ref 11–51)

## 2017-06-27 MED ORDER — PANTOPRAZOLE SODIUM 40 MG IV SOLR
40.0000 mg | Freq: Two times a day (BID) | INTRAVENOUS | Status: DC
  Administered 2017-06-28 – 2017-06-30 (×5): 40 mg via INTRAVENOUS
  Filled 2017-06-27 (×5): qty 40

## 2017-06-27 MED ORDER — LABETALOL HCL 5 MG/ML IV SOLN
20.0000 mg | Freq: Once | INTRAVENOUS | Status: AC
  Administered 2017-06-27: 20 mg via INTRAVENOUS
  Filled 2017-06-27: qty 4

## 2017-06-27 MED ORDER — INSULIN ASPART 100 UNIT/ML ~~LOC~~ SOLN
0.0000 [IU] | Freq: Three times a day (TID) | SUBCUTANEOUS | Status: DC
  Administered 2017-06-28 – 2017-06-29 (×4): 2 [IU] via SUBCUTANEOUS
  Administered 2017-06-30: 3 [IU] via SUBCUTANEOUS
  Administered 2017-06-30 – 2017-07-01 (×2): 2 [IU] via SUBCUTANEOUS
  Filled 2017-06-27 (×8): qty 1

## 2017-06-27 MED ORDER — MORPHINE SULFATE (PF) 4 MG/ML IV SOLN
4.0000 mg | Freq: Once | INTRAVENOUS | Status: AC
  Administered 2017-06-27: 4 mg via INTRAVENOUS
  Filled 2017-06-27: qty 1

## 2017-06-27 MED ORDER — METOPROLOL SUCCINATE ER 100 MG PO TB24
100.0000 mg | ORAL_TABLET | Freq: Every evening | ORAL | Status: DC
  Administered 2017-06-28 – 2017-06-30 (×2): 100 mg via ORAL
  Filled 2017-06-27: qty 2
  Filled 2017-06-27: qty 1

## 2017-06-27 MED ORDER — PROCHLORPERAZINE EDISYLATE 5 MG/ML IJ SOLN
10.0000 mg | Freq: Four times a day (QID) | INTRAMUSCULAR | Status: DC | PRN
  Filled 2017-06-27: qty 2

## 2017-06-27 MED ORDER — FAMOTIDINE IN NACL 20-0.9 MG/50ML-% IV SOLN
20.0000 mg | Freq: Once | INTRAVENOUS | Status: AC
  Administered 2017-06-27: 20 mg via INTRAVENOUS
  Filled 2017-06-27: qty 50

## 2017-06-27 MED ORDER — IPRATROPIUM-ALBUTEROL 0.5-2.5 (3) MG/3ML IN SOLN
3.0000 mL | Freq: Four times a day (QID) | RESPIRATORY_TRACT | Status: DC | PRN
  Administered 2017-07-01: 3 mL via RESPIRATORY_TRACT
  Filled 2017-06-27: qty 3

## 2017-06-27 MED ORDER — METOCLOPRAMIDE HCL 5 MG/ML IJ SOLN
10.0000 mg | Freq: Once | INTRAMUSCULAR | Status: AC
  Administered 2017-06-27: 10 mg via INTRAVENOUS
  Filled 2017-06-27: qty 2

## 2017-06-27 MED ORDER — IRBESARTAN 150 MG PO TABS
150.0000 mg | ORAL_TABLET | Freq: Every day | ORAL | Status: DC
  Filled 2017-06-27: qty 1

## 2017-06-27 MED ORDER — SEVELAMER CARBONATE 800 MG PO TABS: 2400.0000 mg | ORAL_TABLET | Freq: Three times a day (TID) | ORAL | Status: DC

## 2017-06-27 MED ORDER — ONDANSETRON HCL 4 MG/2ML IJ SOLN
INTRAMUSCULAR | Status: AC
  Administered 2017-06-27: 4 mg
  Filled 2017-06-27: qty 2

## 2017-06-27 MED ORDER — IOPAMIDOL (ISOVUE-300) INJECTION 61%
100.0000 mL | Freq: Once | INTRAVENOUS | Status: AC | PRN
  Administered 2017-06-27: 100 mL via INTRAVENOUS

## 2017-06-27 MED ORDER — GABAPENTIN 100 MG PO CAPS
100.0000 mg | ORAL_CAPSULE | Freq: Three times a day (TID) | ORAL | Status: DC
  Administered 2017-06-28 (×2): 100 mg via ORAL
  Filled 2017-06-27 (×3): qty 1

## 2017-06-27 MED ORDER — HYDRALAZINE HCL 20 MG/ML IJ SOLN
10.0000 mg | INTRAMUSCULAR | Status: DC | PRN
  Administered 2017-06-27 – 2017-06-28 (×3): 10 mg via INTRAVENOUS
  Filled 2017-06-27 (×3): qty 1

## 2017-06-27 MED ORDER — IRBESARTAN 150 MG PO TABS: 150.0000 mg | ORAL_TABLET | Freq: Every day | ORAL | Status: DC

## 2017-06-27 MED ORDER — INSULIN ASPART 100 UNIT/ML ~~LOC~~ SOLN
0.0000 [IU] | Freq: Every day | SUBCUTANEOUS | Status: DC
  Administered 2017-06-29 – 2017-06-30 (×2): 2 [IU] via SUBCUTANEOUS
  Filled 2017-06-27 (×2): qty 1

## 2017-06-27 MED ORDER — METOCLOPRAMIDE HCL 5 MG/ML IJ SOLN
5.0000 mg | Freq: Four times a day (QID) | INTRAMUSCULAR | Status: DC
  Administered 2017-06-28 (×3): 5 mg via INTRAVENOUS
  Filled 2017-06-27 (×3): qty 2

## 2017-06-27 MED ORDER — PROMETHAZINE HCL 25 MG/ML IJ SOLN
INTRAMUSCULAR | Status: AC
  Administered 2017-06-27: 25 mg via INTRAVENOUS
  Filled 2017-06-27: qty 1

## 2017-06-27 MED ORDER — PROMETHAZINE HCL 25 MG/ML IJ SOLN
25.0000 mg | Freq: Once | INTRAMUSCULAR | Status: AC
  Administered 2017-06-27: 25 mg via INTRAVENOUS

## 2017-06-27 MED ORDER — CINACALCET HCL 30 MG PO TABS
60.0000 mg | ORAL_TABLET | Freq: Every day | ORAL | Status: DC
  Filled 2017-06-27: qty 2

## 2017-06-27 MED ORDER — LABETALOL HCL 5 MG/ML IV SOLN
10.0000 mg | INTRAVENOUS | Status: DC | PRN
  Administered 2017-06-27 – 2017-06-29 (×6): 10 mg via INTRAVENOUS
  Filled 2017-06-27 (×6): qty 4

## 2017-06-27 MED ORDER — METOPROLOL SUCCINATE ER 50 MG PO TB24
100.0000 mg | ORAL_TABLET | Freq: Once | ORAL | Status: DC
  Filled 2017-06-27: qty 2

## 2017-06-27 NOTE — Progress Notes (Signed)
Patient asleep sats down to 88% O2 increased twice sats continue to drop to 88% [place] NP made aware ok with 88% patient with history of COPD

## 2017-06-27 NOTE — Consult Note (Signed)
Name: Cynthia Dean MRN: [id number] DOB: 12/30/1950    ADMISSION DATE:  06/27/2017  CONSULTATION DATE:  06/27/17  REFERRING MD :  Dr. Leslye Peer  CHIEF COMPLAINT:  Abdominal pain  SIGNIFICANT EVENTS : Patient with ESRD admitted to the SDU with Hypertension  STUDIES:   12/4 CT Head>>New focal low density is seen in right frontal lobe concerning forinfarction of indeterminate age may  12/4 CT abdomen and pelvis>>Fluid-filled distention of the stomach associated with a small to moderate-sized hiatal hernia. Question gastroparesis    HISTORY OF PRESENT ILLNESS:SophiaWilkinsis a66 y.o.femalewith a known history of end-stage renal disease on dialysis, COPD,CAD,DM,HLD and HTN.  Patient admitted to [facility] with excessive,nausea,vomitting and diarrhea. She is also complaining of abdominal pain and reports that her stool has been black.  Patient was noted to have elevated blood pressure, which was controlled by PRN labetalol and hydralazine Gastroenterologist /Nephrologist was consulted. Requested discharge to [facility] as she is seen by Dr. Mammie Lorenzo  PAST MEDICAL HISTORY :   has a past medical history of Chronic kidney disease, COPD (chronic obstructive pulmonary disease) ([place]), Coronary artery disease, Diabetes mellitus without complication ([place]), Heart murmur, Hyperlipidemia, and Hypertension.  has a past surgical history that includes Cardiac catheterization (Left, 02/02/2015); Cardiac catheterization (Left, 02/02/2015); Breast biopsy (Bilateral, 07/19/00); Cardiac catheterization (Left, 03/09/2015); Cardiac catheterization (N/A, 03/09/2015); Cardiac catheterization (Left, 04/17/2015); Cardiac catheterization (04/17/2015); Cardiac catheterization (N/A, 08/10/2015); Cardiac catheterization (N/A, 08/10/2015); Cardiac catheterization (Left, 04/11/2016); A/V Fistulagram (Left, 12/20/2016); and A/V SHUNT INTERVENTION (N/A, 12/20/2016). Prior to Admission medications   Medication Sig Start  Date End Date Taking? Authorizing Provider  aspirin 81 MG tablet Take 81 mg by mouth daily.   Yes [provider]  atorvastatin (LIPITOR) 80 MG tablet Take 80 mg by mouth every evening.    Yes [provider]  cinacalcet (SENSIPAR) 60 MG tablet Take 60 mg by mouth daily.   Yes [provider]  clopidogrel (PLAVIX) 75 MG tablet TAKE ONE TABLET BY MOUTH ONCE DAILY 06/14/16  Yes Stegmayer, Joelene Millin A, PA-C  gabapentin (NEURONTIN) 100 MG capsule Take 100 mg by mouth 3 (three) times daily.   Yes [provider]  insulin NPH-regular Human (NOVOLIN 70/30) (70-30) 100 UNIT/ML injection Inject 40 Units into the skin 2 (two) times daily with a meal. Except dialysis days. Do not take on Tuesdays, Thursdays, and Saturdays.   Yes [provider]  irbesartan (AVAPRO) 150 MG tablet Take 1 tablet by mouth daily. 04/20/17  Yes [provider]  metoprolol succinate (TOPROL-XL) 100 MG 24 hr tablet Take 100 mg by mouth every evening. Take with or immediately following a meal.    Yes [provider]  omeprazole (PRILOSEC) 20 MG capsule Take 20 mg by mouth 2 (two) times daily.    Yes [provider]  pregabalin (LYRICA) 50 MG capsule Take 50 mg by mouth 2 (two) times daily.   Yes [provider]  sevelamer carbonate (RENVELA) 800 MG tablet Take 2,400 mg by mouth 3 (three) times daily with meals.    Yes [provider]  valsartan (DIOVAN) 160 MG tablet Take 160 mg by mouth every evening.    Yes [provider]  albuterol-ipratropium (COMBIVENT) 18-103 MCG/ACT inhaler Inhale 2 puffs into the lungs 2 (two) times daily.    [provider]  ipratropium-albuterol (DUONEB) 0.5-2.5 (3) MG/3ML SOLN Take 3 mLs by nebulization every 6 (six) hours as needed (shortness of breath/ wheezing).     [provider]  lidocaine-prilocaine (EMLA) cream Apply 1 application topically as needed (port access).     [provider]   Allergies  Allergen Reactions  . Tape Itching    Skin Dermatitis/itching (tape adhesive)    FAMILY HISTORY:  family history includes Breast cancer (age of onset: 79) in her cousin; Breast cancer (age of onset: 32) in her father; Heart Problems in her sister; Heart attack in her mother; Stroke in her mother. SOCIAL HISTORY:  reports that she has quit smoking. Her smoking use included cigarettes. She smoked 0.00 packs per day. she has never used smokeless tobacco. She reports that she does not drink alcohol or use drugs.  REVIEW OF SYSTEMS:   Constitutional: Negative for fever, chills, weight loss, malaise/fatigue and diaphoresis.  HENT: Negative for hearing loss, ear pain, nosebleeds, congestion, sore throat, neck pain, tinnitus and ear discharge.   Eyes: Negative for blurred vision, double vision, photophobia, pain, discharge and redness.  Respiratory: Negative for cough, hemoptysis, sputum production, shortness of breath, wheezing and stridor.   Cardiovascular: Negative for chest pain, palpitations, orthopnea, claudication, leg swelling and PND.  Gastrointestinal: Negative for heartburn, nausea, vomiting, abdominal pain, diarrhea, constipation, blood in stool and melena.  Genitourinary: Negative for dysuria, urgency, frequency, hematuria and flank pain.  Musculoskeletal: Negative for myalgias, back pain, joint pain and falls.  Skin: Negative for itching and rash.  Neurological: Negative for dizziness, tingling, tremors, sensory change, speech change, focal weakness, seizures, loss of consciousness, weakness and headaches.  Endo/Heme/Allergies: Negative for environmental allergies and polydipsia. Does not bruise/bleed easily.  SUBJECTIVE: Patient is awake but mildly confused  VITAL SIGNS: Temp:  [98.3 F (36.8 C)-98.7 F (37.1 C)] 98.7 F (37.1 C) (12/04 1800) Pulse Rate:  [73-98] 96 (12/04 1800) Resp:  [16-26] 21 (12/04 1800) BP: (129-230)/(80-161) 200/80 (12/04  1800) SpO2:  [90 %-98 %] 93 % (12/04 1800) Weight:  [230 lb (104.3 kg)-268 lb 1.3 oz (121.6 kg)] 268 lb 1.3 oz (121.6 kg) (12/04 1800)  PHYSICAL EXAMINATION: General:  66 year old female , in no acute distress Neuro: Awake but confused HEENT:  AT,[place],No jvd Cardiovascular:  S1s2,regular,no m/r/g Lungs:  Clear bilaterally , no wheezes,crackles and rhonchi Abdomen:  Soft,NT,ND Musculoskeletal:  No edema,cyanosis Skin:  Warm and intact  Recent Labs  Lab 06/27/17 1307  NA 142  K 3.7  CL 96*  CO2 27  BUN 39*  CREATININE 7.77*  GLUCOSE 127*   Recent Labs  Lab 06/27/17 1307  HGB 11.7*  HCT 35.7  WBC 12.7*  PLT 254   Ct Head Wo Contrast  Result Date: 06/27/2017 CLINICAL DATA:  Altered level of consciousness. EXAM: CT HEAD WITHOUT CONTRAST TECHNIQUE: Contiguous axial images were obtained from the base of the skull through the vertex without intravenous contrast. COMPARISON:  CT scan of April 14, 2016. MRI of April 15, 2016. FINDINGS: Brain: Mild chronic ischemic white matter disease is noted. Old right cerebellar infarction is noted. No midline shift is noted. Ventricular size is within normal limits. Old left posterior parietal infarction is noted. New cortical low density is noted in right frontal lobe concerning for infarction of indeterminate age. Vascular: No hyperdense vessel or unexpected calcification. Skull: Normal. Negative for fracture or focal lesion. Sinuses/Orbits: No acute finding. Other: None. IMPRESSION: New focal low density is seen in right frontal lobe concerning for infarction of indeterminate age. MRI may be performed for further evaluation. Electronically Signed   By: Marijo Conception, M.D.   On: 06/27/2017 17:03  Ct Abdomen Pelvis W Contrast  Result Date: 06/27/2017 CLINICAL DATA:  66 year old female on dialysis presenting with nausea, vomiting and diarrhea since Sunday. EXAM: CT ABDOMEN AND PELVIS WITH CONTRAST TECHNIQUE: Multidetector CT imaging of the  abdomen and pelvis was performed using the standard protocol following bolus administration of intravenous contrast. CONTRAST:  157mL ISOVUE-300 IOPAMIDOL (ISOVUE-300) INJECTION 61% COMPARISON:  05/12/2013 FINDINGS: Lower chest: Stable cardiomegaly. Passive atelectasis at the lung bases. Small to moderate-sized hiatal hernia. Hepatobiliary: No focal liver abnormality is seen. No gallstones, gallbladder wall thickening, or biliary dilatation. Pancreas: Unremarkable. No pancreatic ductal dilatation or surrounding inflammatory changes. Spleen: No splenomegaly. Stable 16 mm hypodensity at the splenic hilum likely to represent a small splenic cyst. 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 Normal right adrenal gland. Renovascular calcifications are noted at at the hila of both kidneys with perinephric fat stranding, chronic in appearance. No obstructive uropathy. No enhancing renal masses. The urinary bladder is decompressed. Stomach/Bowel: The stomach is distended with fluid and may reflect gastroparesis. Normal small bowel rotation without mural thickening or obstruction. Small 17 mm submucosal lipoma in the cecum. Normal appendix. No acute abnormality of the colon. Vascular/Lymphatic: Moderate aortoiliac and branch vessel atherosclerosis. Reproductive: Simple 3.6 cm left ovarian cyst. The uterus and right adnexa are unremarkable. Other: No free air free fluid. Musculoskeletal: Degenerative disc disease L5-S1 with partially calcified broad-based disc bulge. No acute nor suspicious osseous abnormality. IMPRESSION: 1. Fluid-filled distention of the stomach associated with a small to moderate-sized hiatal hernia. Question gastroparesis. Presence of hiatal hernia may predispose the patient to reflux given the fluid distention of the stomach. 2. Stable splenic 16 mm hypodensity at the hilum likely to represent a small splenic cyst. 3. 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 4. 17 mm submucosal lipoma within the cecum. 5. Simple 3.6 cm left ovarian cyst. Given stability since 2014, findings likely represent a benign etiology. 6. Degenerative disc disease L5-S1. Electronically Signed   By: Ashley Royalty M.D.   On: 06/27/2017 14:54    ASSESSMENT / PLAN:  1.Accelerated hypertension. -Continue PRN hydralazine and labetalol -Restart home meds Avapro/valsartan/labetalol CT head >>New focal low density is seen in right frontal lobe concerning for infarction of indeterminate age  16.Nausea vomiting and diarrhea with hematemesis -Nausea vomiting and diarrhea with hematemesis - GI consulted -NPO for now -PRN Compazine and Reglan - continue Protonix - send stool specimen for c-diff testing and for occult blood test  3. End-stage renal disease on dialysis. - Continue Monday- Wednesday -Friday dialysis Nephrology consulted  4. History of CHF -No active signs  5. Type 2DM -Npo for now - blood glucose checks with SSI Coverage     Claudy Abdallah,AG-ACNP Pulmonary and [place]   06/27/2017, 10:15 PM

## 2017-06-27 NOTE — ED Triage Notes (Signed)
Pt to ED from home c/o left side abd pain and n/v since yesterday.  Cramping pain LUQ.  Last BM today.

## 2017-06-27 NOTE — ED Provider Notes (Addendum)
[facility]    CSN: [id number] Arrival date & time: 06/27/17  9629  History   Chief Complaint Chief Complaint  Patient presents with  . Nausea  . Emesis  . Diarrhea   HPI  66 year old female with an extensive past medical history including chronic kidney disease on hemodialysis, COPD, CAD, hypertension, hyperlipidemia, DM 2 presents with the above complaints.  Patient states that she has been sick since Sunday.  On Sunday, she developed nausea, vomiting, diarrhea.  She states that she has been unable to keep anything down.  She has continued to vomit frequently.  She states that she is thrown up approximately 5 times today.  She reports subjective fever and reports chills. Reports associated abdominal pain. No reports of dysuria.  She  does still make urine.  No known inciting factor, however she thinks it may have been precipitated by recent steroid injection.  She states she has been keeping an eye on her blood sugars.  Patient symptoms are severe.  She does not appear well.  She has no other complaints or concerns at this time.  Past Medical History:  Diagnosis Date  . Chronic kidney disease   . COPD (chronic obstructive pulmonary disease) ([place])   . Coronary artery disease   . Diabetes mellitus without complication ([place])   . Heart murmur   . Hyperlipidemia   . Hypertension     Patient Active Problem List   Diagnosis Date Noted  . 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 Essential hypertension, benign 11/27/2015  . Hyperlipidemia 11/27/2015  . Obesity 11/27/2015  . Prolonged Q-T interval on ECG 11/26/2015  . Intractable nausea and vomiting 08/08/2015    Past Surgical History:  Procedure Laterality Date  . A/V FISTULAGRAM Left 12/20/2016   Procedure: A/V Fistulagram;  Surgeon: Katha Cabal, MD;  Location: [place] [facility];  Service: Cardiovascular;  Laterality: Left;  . A/V SHUNT INTERVENTION N/A 12/20/2016   Procedure: A/V Shunt Intervention;  Surgeon:  Katha Cabal, MD;  Location: [place] [facility];  Service: Cardiovascular;  Laterality: N/A;  . BREAST BIOPSY Bilateral 07/19/00   neg  . PERIPHERAL VASCULAR CATHETERIZATION Left 02/02/2015   Procedure: A/V Shuntogram/Fistulagram;  Surgeon: Algernon Huxley, MD;  Location: [place] [facility];  Service: Cardiovascular;  Laterality: Left;  . PERIPHERAL VASCULAR CATHETERIZATION Left 02/02/2015   Procedure: A/V Shunt Intervention;  Surgeon: Algernon Huxley, MD;  Location: [place] [facility];  Service: Cardiovascular;  Laterality: Left;  . PERIPHERAL VASCULAR CATHETERIZATION Left 03/09/2015   Procedure: A/V Shuntogram/Fistulagram;  Surgeon: Algernon Huxley, MD;  Location: [place] [facility];  Service: Cardiovascular;  Laterality: Left;  . PERIPHERAL VASCULAR CATHETERIZATION N/A 03/09/2015   Procedure: A/V Shunt Intervention;  Surgeon: Algernon Huxley, MD;  Location: [place] [facility];  Service: Cardiovascular;  Laterality: N/A;  . PERIPHERAL VASCULAR CATHETERIZATION Left 04/17/2015   Procedure: Upper Extremity Angiography;  Surgeon: Algernon Huxley, MD;  Location: [place] [facility];  Service: Cardiovascular;  Laterality: Left;  . PERIPHERAL VASCULAR CATHETERIZATION  04/17/2015   Procedure: Upper Extremity Intervention;  Surgeon: Algernon Huxley, MD;  Location: [place] [facility];  Service: Cardiovascular;;  . PERIPHERAL VASCULAR CATHETERIZATION N/A 08/10/2015   Procedure: A/V Shuntogram/Fistulagram;  Surgeon: Algernon Huxley, MD;  Location: [place] [facility];  Service: Cardiovascular;  Laterality: N/A;  . PERIPHERAL VASCULAR CATHETERIZATION N/A 08/10/2015   Procedure: A/V Shunt Intervention;  Surgeon: Algernon Huxley, MD;  Location: [place] [facility];  Service: Cardiovascular;  Laterality: N/A;  . PERIPHERAL VASCULAR CATHETERIZATION Left 04/11/2016   Procedure: A/V Shuntogram/Fistulagram;  Surgeon: Algernon Huxley, MD;  Location: [place] [facility];  Service: Cardiovascular;  Laterality: Left;    OB History    No  data available       Home Medications    Prior to Admission medications   Medication Sig Start Date End Date Taking? Authorizing Provider  aspirin 81 MG tablet Take 81 mg by mouth daily.   Yes [provider]  atorvastatin (LIPITOR) 80 MG tablet Take 80 mg by mouth every evening.    Yes [provider]  cinacalcet (SENSIPAR) 60 MG tablet Take 60 mg by mouth daily.   Yes [provider]  clopidogrel (PLAVIX) 75 MG tablet TAKE ONE TABLET BY MOUTH ONCE DAILY 06/14/16  Yes Stegmayer, Joelene Millin A, PA-C  gabapentin (NEURONTIN) 100 MG capsule Take 100 mg by mouth 3 (three) times daily.   Yes [provider]  insulin NPH-regular Human (NOVOLIN 70/30) (70-30) 100 UNIT/ML injection Inject 42 Units into the skin 2 (two) times daily with a meal. Except dialysis days. Do not take on Tuesdays, Thursdays, and Saturdays.   Yes [provider]  ipratropium-albuterol (DUONEB) 0.5-2.5 (3) MG/3ML SOLN Take 3 mLs by nebulization every 6 (six) hours as needed (shortness of breath/ wheezing).    Yes [provider]  metoprolol succinate (TOPROL-XL) 100 MG 24 hr tablet Take 100 mg by mouth every evening. Take with or immediately following a meal.    Yes [provider]  omeprazole (PRILOSEC) 20 MG capsule Take 20 mg by mouth 2 (two) times daily.    Yes [provider]  pregabalin (LYRICA) 50 MG capsule Take 50 mg by mouth 2 (two) times daily.   Yes [provider]  sevelamer carbonate (RENVELA) 800 MG tablet Take 2,400 mg by mouth 3 (three) times daily with meals.    Yes [provider]  valsartan (DIOVAN) 160 MG tablet Take 160 mg by mouth every evening.    Yes [provider]  albuterol-ipratropium (COMBIVENT) 18-103 MCG/ACT inhaler Inhale 2 puffs into the lungs 2 (two) times daily.    [provider]  lidocaine-prilocaine (EMLA) cream Apply 1 application topically as needed (port access).     [provider]    Family History Family History  Problem Relation Age of Onset  . Breast cancer Father 62  . Stroke Mother   . Heart attack Mother   . Heart Problems Sister   . Breast cancer Cousin 49       1 st cousin. Maternal     Social History Social History   Tobacco Use  . Smoking status: Former Smoker    Packs/day: 0.00    Types: Cigarettes  . Smokeless tobacco: Never Used  Substance Use Topics  . Alcohol use: No  . Drug use: No     Allergies   Tape   Review of Systems Review of Systems  Constitutional: Positive for chills and fever.  Gastrointestinal: Positive for diarrhea, nausea and vomiting. Negative for abdominal pain.  All other systems reviewed and are negative.  Physical Exam Triage Vital Signs ED Triage Vitals  Enc Vitals Group     BP 06/27/17 1039 (!) 230/106     Pulse Rate 06/27/17 1039 98     Resp 06/27/17 1039 16     Temp 06/27/17 1039 98.4 F (36.9 C)     Temp Source 06/27/17  1039 Oral     SpO2 06/27/17 1039 98 %     Weight 06/27/17 1053 230 lb (104.3 kg)     Height 06/27/17 1053 5\' 7"  (1.702 m)     Head Circumference --      Peak Flow --      Pain Score --      Pain Loc --      Pain Edu? --      Excl. in [place]? --    Orthostatic VS for the past 24 hrs:  BP- Lying Pulse- Lying BP- Sitting Pulse- Sitting BP- Standing at 0 minutes Pulse- Standing at 0 minutes  06/27/17 1045 (!) 230/106 90 (!) 218/94 101 186/80 102    Updated Vital Signs BP (!) 230/106 (BP Location: Right Arm)   Pulse 98   Temp 98.4 F (36.9 C) (Oral)   Resp 16   Ht 5\' 7"  (1.702 m)   Wt 230 lb (104.3 kg)   SpO2 98%   BMI 36.02 kg/m   Physical Exam  Constitutional: She is oriented to person, place, and time. She appears well-developed and well-nourished.  Shivering.  Ill appearance.  HENT:  Head: Normocephalic and atraumatic.  Nose: Nose normal.  Mouth/Throat: No oropharyngeal exudate.  Eyes: Conjunctivae are normal. No scleral icterus.  Neck: No  tracheal deviation present.  Cardiovascular:  Tachycardia.  Regular rhythm.  Pulmonary/Chest: Effort normal and breath sounds normal. She has no wheezes. She has no rales.  Abdominal:  Soft, nondistended.  No tenderness appreciated.  Musculoskeletal: Normal range of motion.  Neurological: She is alert and oriented to person, place, and time.  Skin: Skin is warm.  Psychiatric: She has a normal mood and affect. Her behavior is normal.  Vitals reviewed.  UC Treatments / Results  Labs (all labs ordered are listed, but only abnormal results are displayed) Labs Reviewed  GLUCOSE, CAPILLARY - Abnormal; Notable for the following components:      Result Value   Glucose-Capillary 154 (*)    All other components within normal limits    EKG  EKG Interpretation None       Radiology No results found.  Procedures Procedures (including critical care time)  Medications Ordered in UC Medications - No data to display   Initial Impression / Assessment and Plan / UC Course  I have reviewed the triage vital signs and the nursing notes.  Pertinent labs & imaging results that were available during my care of the patient were reviewed by me and considered in my medical decision making (see chart for details).     66 year old female presents with persistent nausea, vomiting, diarrhea.  Also has hypertensive emergency.  Patient with an acute complicated illness.  Ill-appearing.  Rigors.  Sending it via the EMS to the hospital.  Final Clinical Impressions(s) / UC Diagnoses   Final diagnoses:  Nausea vomiting and diarrhea  Hypertensive emergency    ED Discharge Orders    None     Controlled Substance Prescriptions [place] Controlled Substance Registry consulted? Not Applicable   Coral Spikes, DO 06/27/17 1140    Thersa Salt G, [place] 06/27/17 1226

## 2017-06-27 NOTE — ED Triage Notes (Signed)
N/V/D, right flank pain since Sunday. Pt is IDDM. Recently had steroid injections to hands.

## 2017-06-27 NOTE — Discharge Summary (Signed)
Physician Discharge Summary  Patient ID: Cynthia Dean MRN: [id number] DOB/AGE: 1951-05-20 66 y.o.  Admit date: 06/27/2017  Discharge date: 06/27/2017   Discharge Diagnoses:   Accelerated hypertension Nausea vomiting and diarrhea with hematemesis End-stage renal disease on dialysis History of CHF Type 2 diabetes mellitus                                                                       DISCHARGE PLAN BY DIAGNOSIS    1.Accelerated hypertension. -Continue PRN hydralazine and labetalol -Restart home meds Avapro/valsartan/labetalol CT head >>New focal low density is seen in right frontal lobe concerning for infarction of indeterminate age  49.Nausea vomiting and diarrhea with hematemesis -Nausea vomiting and diarrhea with hematemesis - GI consulted -NPO for now -PRN Compazine and Reglan - continue Protonix - send stool specimen for c-diff testing and for occult blood test  3.  End-stage renal disease on dialysis. - Continue Monday- Wednesday -Friday dialysis Nephrology consulted  4. History of CHF -No active signs  5. Type 2DM -Npo for now - blood glucose checks with SSI Coverage               DISCHARGE SUMMARY  Cynthia Dean  is a 66 y.o. female with a known history of end-stage renal disease on dialysis, COPD,CAD,DM,HLD and HTN.  Patient admitted to [facility] with excessive,nausea,vomitting and diarrhea. She is also complaining of abdominal pain and reports that her stool has been black.  Patient was noted to have elevated blood pressure, which was controlled by PRN labetalol and hydralazine Gastroenterologist /Nephrologist was consulted. Requested discharge to [facility] as she is seen by Dr. Mammie Lorenzo .          SIGNIFICANT DIAGNOSTIC STUDIES 12/4 CT Head>>New focal low density is seen in right frontal lobe concerning forinfarction of indeterminate age may  12/4 CT abdomen and pelvis>>Fluid-filled distention of the stomach associated with a small  to moderate-sized hiatal hernia. Question gastroparesis.    MICRO DATA  none  ANTIBIOTICS none  CONSULTS 12/4 Nephrology>> 12/4 Gastroenterology>>  TUBES / LINES None  Discharge Exam: General:Awake, resting in bed, in NAD. Neuro: Awake but confused HEENT: [place]/AT. PERRL, sclerae anicteric. Cardiovascular: RRR, no M/R/G.  Lungs: Respirations even and unlabored.  CTA bilaterally, No W/R/R.  Abdomen: BS x 4, soft, NT/ND.  Musculoskeletal: No gross deformities, no edema.  Skin: Intact, warm, no rashes.   Vitals:   06/27/17 1530 06/27/17 1600 06/27/17 1700 06/27/17 1800  BP: (!) 204/92 (!) 129/103 (!) 223/103 (!) 200/80  Pulse: 89  93 96  Resp: (!) 21  19 (!) 21  Temp:    98.7 F (37.1 C)  TempSrc:    Oral  SpO2: 90%  95% 93%  Weight:    268 lb 1.3 oz (121.6 kg)  Height:    _0  (1.676 m)     Discharge Labs  BMET Recent Labs  Lab 06/27/17 1307  NA 142  K 3.7  CL 96*  CO2 27  GLUCOSE 127*  BUN 39*  CREATININE 7.77*  CALCIUM 10.1    CBC Recent Labs  Lab 06/27/17 1307  HGB 11.7*  HCT 35.7  WBC 12.7*  PLT 254    Anti-Coagulation No results for input(s):  INR in the last 168 hours.      Disposition: [facility]  Discharged Condition: Cynthia Dean has met maximum benefit of inpatient care and is medically stable and cleared for discharge.  Patient is pending follow up as above.       Time Spent:45 minutes.   [facility]

## 2017-06-27 NOTE — ED Triage Notes (Signed)
Pt in via EMS. EMS reports pt is a dialysis patient who has had some vommitting and weakness over the last few days. EMS reports pt with some coffee grounds in her emesis as well. Pt was seen at her MD office and referred to the ED due to vommitting, chills and temperature.

## 2017-06-27 NOTE — ED Triage Notes (Signed)
Also, hx of CRF and is a dialysis pt. Tx on M/W/F. Last tx yesterday and had no problems. Fistula in left upper arm, +bruit/+thrill.

## 2017-06-27 NOTE — H&P (Signed)
[facility] [place] NAME: Cynthia Dean    MR#:  [id number]  DATE OF BIRTH:  1951/04/24  DATE OF ADMISSION:  06/27/2017  PRIMARY CARE PHYSICIAN: Lowella Bandy, MD   REQUESTING/REFERRING PHYSICIAN: Dr Cephas Darby  CHIEF COMPLAINT:   Chief Complaint  Patient presents with  . Abdominal Pain  . Emesis    HISTORY OF PRESENT ILLNESS:  Cynthia Dean  is a 66 y.o. female with a known history of end-stage renal disease on dialysis.  She has been having nausea and vomiting for day and a half.  She states that her stomach is very upset.  She has been vomiting too many times to count.  They have noticed some blood in the vomit.  Also having some diarrhea.  She states the stool has been black.  She has been sleeping a lot.  She has had some right-sided abdominal pain but currently not having pain.  Also had some chest pain but not currently having pain in the chest.  She has had some hiccups.  In the ER, she was found to have elevated blood pressure and nausea vomiting and hospitalist services were contacted for further evaluation.  PAST MEDICAL HISTORY:   Past Medical History:  Diagnosis Date  . Chronic kidney disease   . COPD (chronic obstructive pulmonary disease) ([place])   . Coronary artery disease   . Diabetes mellitus without complication ([place])   . Heart murmur   . Hyperlipidemia   . Hypertension     PAST SURGICAL HISTORY:   Past Surgical History:  Procedure Laterality Date  . A/V FISTULAGRAM Left 12/20/2016   Procedure: A/V Fistulagram;  Surgeon: Katha Cabal, MD;  Location: [facility];  Service: Cardiovascular;  Laterality: Left;  . A/V SHUNT INTERVENTION N/A 12/20/2016   Procedure: A/V Shunt Intervention;  Surgeon: Katha Cabal, MD;  Location: [facility];  Service: Cardiovascular;  Laterality: N/A;  . BREAST BIOPSY Bilateral 07/19/00   neg  . PERIPHERAL VASCULAR CATHETERIZATION Left  02/02/2015   Procedure: A/V Shuntogram/Fistulagram;  Surgeon: Algernon Huxley, MD;  Location: [facility];  Service: Cardiovascular;  Laterality: Left;  . PERIPHERAL VASCULAR CATHETERIZATION Left 02/02/2015   Procedure: A/V Shunt Intervention;  Surgeon: Algernon Huxley, MD;  Location: [facility];  Service: Cardiovascular;  Laterality: Left;  . PERIPHERAL VASCULAR CATHETERIZATION Left 03/09/2015   Procedure: A/V Shuntogram/Fistulagram;  Surgeon: Algernon Huxley, MD;  Location: [facility];  Service: Cardiovascular;  Laterality: Left;  . PERIPHERAL VASCULAR CATHETERIZATION N/A 03/09/2015   Procedure: A/V Shunt Intervention;  Surgeon: Algernon Huxley, MD;  Location: [facility];  Service: Cardiovascular;  Laterality: N/A;  . PERIPHERAL VASCULAR CATHETERIZATION Left 04/17/2015   Procedure: Upper Extremity Angiography;  Surgeon: Algernon Huxley, MD;  Location: [facility];  Service: Cardiovascular;  Laterality: Left;  . PERIPHERAL VASCULAR CATHETERIZATION  04/17/2015   Procedure: Upper Extremity Intervention;  Surgeon: Algernon Huxley, MD;  Location: [facility];  Service: Cardiovascular;;  . PERIPHERAL VASCULAR CATHETERIZATION N/A 08/10/2015   Procedure: A/V Shuntogram/Fistulagram;  Surgeon: Algernon Huxley, MD;  Location: [facility];  Service: Cardiovascular;  Laterality: N/A;  . PERIPHERAL VASCULAR CATHETERIZATION N/A 08/10/2015   Procedure: A/V Shunt Intervention;  Surgeon: Algernon Huxley, MD;  Location: [facility];  Service: Cardiovascular;  Laterality: N/A;  . PERIPHERAL VASCULAR CATHETERIZATION Left 04/11/2016   Procedure: A/V  Shuntogram/Fistulagram;  Surgeon: Algernon Huxley, MD;  Location: [facility];  Service: Cardiovascular;  Laterality: Left;    SOCIAL HISTORY:   Social History   Tobacco Use  . Smoking status: Former Smoker    Packs/day: 0.00    Types: Cigarettes  . Smokeless tobacco: Never Used  Substance Use Topics  . Alcohol use: No     FAMILY HISTORY:   Family History  Problem Relation Age of Onset  . Breast cancer Father 71  . Stroke Mother   . Heart attack Mother   . Heart Problems Sister   . Breast cancer Cousin 78       1 st cousin. Maternal     DRUG ALLERGIES:   Allergies  Allergen Reactions  . Tape Itching    Skin Dermatitis/itching (tape adhesive)    REVIEW OF SYSTEMS:  CONSTITUTIONAL: Positive for fever, chills and sweats.  Positive for fatigue.  Positive for weight gain. EYES: No blurred or double vision.  EARS, NOSE, AND THROAT: No tinnitus or ear pain. No sore throat RESPIRATORY: No cough, positive for shortness of breath, no wheezing or hemoptysis.  CARDIOVASCULAR: Positive for chest pain, no orthopnea, edema.  GASTROINTESTINAL: No nausea, vomiting, diarrhea or abdominal pain. No blood in bowel movements GENITOURINARY: No dysuria, hematuria.  ENDOCRINE: No polyuria, nocturia,  HEMATOLOGY: No anemia, easy bruising or bleeding SKIN: No rash or lesion. MUSCULOSKELETAL: No joint pain or arthritis.   NEUROLOGIC: No tingling, numbness, weakness.  PSYCHIATRY: No anxiety or depression.   MEDICATIONS AT HOME:   Prior to Admission medications   Medication Sig Start Date End Date Taking? Authorizing Provider  aspirin 81 MG tablet Take 81 mg by mouth daily.   Yes [provider]  atorvastatin (LIPITOR) 80 MG tablet Take 80 mg by mouth every evening.    Yes [provider]  cinacalcet (SENSIPAR) 60 MG tablet Take 60 mg by mouth daily.   Yes [provider]  clopidogrel (PLAVIX) 75 MG tablet TAKE ONE TABLET BY MOUTH ONCE DAILY 06/14/16  Yes Stegmayer, Joelene Millin A, PA-C  gabapentin (NEURONTIN) 100 MG capsule Take 100 mg by mouth 3 (three) times daily.   Yes [provider]  insulin NPH-regular Human (NOVOLIN 70/30) (70-30) 100 UNIT/ML injection Inject 40 Units into the skin 2 (two) times daily with a meal. Except dialysis days. Do not take on Tuesdays, Thursdays, and  Saturdays.   Yes [provider]  irbesartan (AVAPRO) 150 MG tablet Take 1 tablet by mouth daily. 04/20/17  Yes [provider]  metoprolol succinate (TOPROL-XL) 100 MG 24 hr tablet Take 100 mg by mouth every evening. Take with or immediately following a meal.    Yes [provider]  omeprazole (PRILOSEC) 20 MG capsule Take 20 mg by mouth 2 (two) times daily.    Yes [provider]  pregabalin (LYRICA) 50 MG capsule Take 50 mg by mouth 2 (two) times daily.   Yes [provider]  sevelamer carbonate (RENVELA) 800 MG tablet Take 2,400 mg by mouth 3 (three) times daily with meals.    Yes [provider]  valsartan (DIOVAN) 160 MG tablet Take 160 mg by mouth every evening.    Yes [provider]  albuterol-ipratropium (COMBIVENT) 18-103 MCG/ACT inhaler Inhale 2 puffs into the lungs 2 (two) times daily.    [provider]  ipratropium-albuterol (DUONEB) 0.5-2.5 (3) MG/3ML SOLN Take 3 mLs by nebulization every 6 (six) hours as needed (shortness of breath/ wheezing).  [provider]  lidocaine-prilocaine (EMLA) cream Apply 1 application topically as needed (port access).     [provider]      VITAL SIGNS:  Blood pressure (!) 129/103, pulse 89, temperature 98.3 F (36.8 C), temperature source Oral, resp. rate (!) 21, height 5\' 7"  (1.702 m), weight 104.3 kg (230 lb), SpO2 90 %.  PHYSICAL EXAMINATION:  GENERAL:  66 y.o.-year-old patient lying in the bed with no acute distress.  EYES: Pupils equal, round, reactive to light and accommodation. No scleral icterus. Extraocular muscles intact.  HEENT: Head atraumatic, normocephalic. Oropharynx and nasopharynx clear.  NECK:  Supple, no jugular venous distention. No thyroid enlargement, no tenderness.  LUNGS: Normal breath sounds bilaterally, no wheezing, rales,rhonchi or crepitation. No use of accessory muscles of respiration.  CARDIOVASCULAR: S1, S2 normal. No  murmurs, rubs, or gallops.  ABDOMEN: Soft, nontender, nondistended. Bowel sounds present. No organomegaly or mass.  EXTREMITIES: Trace edema, no cyanosis, or clubbing.  NEUROLOGIC: Cranial nerves II through XII are intact. Muscle strength 5/5 in all extremities. Sensation intact. Gait not checked.  PSYCHIATRIC: The patient is alert and oriented x 3.  SKIN: No rash, lesion, or ulcer.   LABORATORY PANEL:   CBC Recent Labs  Lab 06/27/17 1307  WBC 12.7*  HGB 11.7*  HCT 35.7  PLT 254   ------------------------------------------------------------------------------------------------------------------  Chemistries  Recent Labs  Lab 06/27/17 1307  NA 142  K 3.7  CL 96*  CO2 27  GLUCOSE 127*  BUN 39*  CREATININE 7.77*  CALCIUM 10.1  AST 26  ALT 17  ALKPHOS 91  BILITOT 0.7   ------------------------------------------------------------------------------------------------------------------  Cardiac Enzymes Recent Labs  Lab 06/27/17 1307  TROPONINI 0.07*   ------------------------------------------------------------------------------------------------------------------  RADIOLOGY:  Ct Abdomen Pelvis W Contrast  Result Date: 06/27/2017 CLINICAL DATA:  66 year old female on dialysis presenting with nausea, vomiting and diarrhea since Sunday. EXAM: CT ABDOMEN AND PELVIS WITH CONTRAST TECHNIQUE: Multidetector CT imaging of the abdomen and pelvis was performed using the standard protocol following bolus administration of intravenous contrast. CONTRAST:  171mL ISOVUE-300 IOPAMIDOL (ISOVUE-300) INJECTION 61% COMPARISON:  05/12/2013 FINDINGS: Lower chest: Stable cardiomegaly. Passive atelectasis at the lung bases. Small to moderate-sized hiatal hernia. Hepatobiliary: No focal liver abnormality is seen. No gallstones, gallbladder wall thickening, or biliary dilatation. Pancreas: Unremarkable. No pancreatic ductal dilatation or surrounding inflammatory changes. Spleen: No splenomegaly.  Stable 16 mm hypodensity at the splenic hilum likely to represent a small splenic cyst. 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 Normal right adrenal gland. Renovascular calcifications are noted at at the hila of both kidneys with perinephric fat stranding, chronic in appearance. No obstructive uropathy. No enhancing renal masses. The urinary bladder is decompressed. Stomach/Bowel: The stomach is distended with fluid and may reflect gastroparesis. Normal small bowel rotation without mural thickening or obstruction. Small 17 mm submucosal lipoma in the cecum. Normal appendix. No acute abnormality of the colon. Vascular/Lymphatic: Moderate aortoiliac and branch vessel atherosclerosis. Reproductive: Simple 3.6 cm left ovarian cyst. The uterus and right adnexa are unremarkable. Other: No free air free fluid. Musculoskeletal: Degenerative disc disease L5-S1 with partially calcified broad-based disc bulge. No acute nor suspicious osseous abnormality. IMPRESSION: 1. Fluid-filled distention of the stomach associated with a small to moderate-sized hiatal hernia. Question gastroparesis. Presence of hiatal hernia may predispose the patient to reflux given the fluid distention of the stomach. 2. Stable splenic 16 mm hypodensity at the hilum likely to represent a small splenic cyst.  3. 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 4. 17 mm submucosal lipoma within the cecum. 5. Simple 3.6 cm left ovarian cyst. Given stability since 2014, findings likely represent a benign etiology. 6. Degenerative disc disease L5-S1. Electronically Signed   By: Ashley Royalty M.D.   On: 06/27/2017 14:54    EKG:   Normal sinus rhythm 98 bpm  IMPRESSION AND PLAN:   1.  Accelerated hypertension.  We will get CT scan of the head with her slowness with answering questions.  As needed labetalol and hydralazine for accelerated hypertension.  Case discussed with  Dr. Mortimer Fries critical care specialist.  Hopefully can restart oral medications tomorrow. 2.  Nausea vomiting and diarrhea with hematemesis.  CT scan showing fluid-filled stomach and hiatal hernia.  Could be a diabetic gastroparesis also.  GI consult.  N.p.o. for now.  As needed Compazine.  Standing dose Reglan.  Stool studies if has more bowel movements.  IV Protonix ordered. 3.  End-stage renal disease on dialysis.  Dialysis is Monday Wednesday Friday.  Nephrology consultation for dialysis tomorrow.  4.  History of CHF.  Currently no signs. 5.  Type 2 diabetes mellitus.  With n.p.o. status I will put on sliding scale only.  All the records are reviewed and case discussed with ED provider. Management plans discussed with the patient, family and they are in agreement.  CODE STATUS: Full code  TOTAL TIME TAKING CARE OF THIS PATIENT: 50 minutes.  Because of the accelerated hypertension patient be watched closely in the stepdown unit.   Loletha Grayer M.D on 06/27/2017 at 4:25 PM  Between 7am to 6pm - Pager - [phone]  After 6pm call admission pager [phone]  [facility]  [phone]  CC: Primary care physician; Lowella Bandy, MD

## 2017-06-27 NOTE — ED Provider Notes (Signed)
[facility] Emergency Department Provider Note       Time seen: ----------------------------------------- 12:57 PM on 06/27/2017 -----------------------------------------   I have reviewed the triage vital signs and the nursing notes.  HISTORY   Chief Complaint Abdominal Pain and Emesis    HPI Cynthia Dean is a 66 y.o. female with a history of end-stage renal disease on dialysis who presents to the ED for left-sided abdominal pain with nausea and vomiting since yesterday.  She has had cramping pain to the left upper quadrant.  Her last bowel movement was today.  She reports she is on dialysis and dialysis has been going normally.  She has had vomiting and diarrhea since yesterday.  She thinks she may have seen some coffee grounds in her emesis as well.  Nothing makes her symptoms better or worse.  Past Medical History:  Diagnosis Date  . Chronic kidney disease   . COPD (chronic obstructive pulmonary disease) ([place])   . Coronary artery disease   . Diabetes mellitus without complication ([place])   . Heart murmur   . Hyperlipidemia   . Hypertension     Patient Active Problem List   Diagnosis Date Noted  . 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 Essential hypertension, benign 11/27/2015  . Hyperlipidemia 11/27/2015  . Obesity 11/27/2015  . Prolonged Q-T interval on ECG 11/26/2015  . Intractable nausea and vomiting 08/08/2015    Past Surgical History:  Procedure Laterality Date  . A/V FISTULAGRAM Left 12/20/2016   Procedure: A/V Fistulagram;  Surgeon: Katha Cabal, MD;  Location: [facility];  Service: Cardiovascular;  Laterality: Left;  . A/V SHUNT INTERVENTION N/A 12/20/2016   Procedure: A/V Shunt Intervention;  Surgeon: Katha Cabal, MD;  Location: [place] [facility];  Service: Cardiovascular;  Laterality: N/A;  . BREAST BIOPSY Bilateral 07/19/00   neg  . PERIPHERAL VASCULAR CATHETERIZATION Left 02/02/2015   Procedure:  A/V Shuntogram/Fistulagram;  Surgeon: Algernon Huxley, MD;  Location: [facility];  Service: Cardiovascular;  Laterality: Left;  . PERIPHERAL VASCULAR CATHETERIZATION Left 02/02/2015   Procedure: A/V Shunt Intervention;  Surgeon: Algernon Huxley, MD;  Location: [place] [facility] [facility];  Service: Cardiovascular;  Laterality: Left;  . PERIPHERAL VASCULAR CATHETERIZATION Left 03/09/2015   Procedure: A/V Shuntogram/Fistulagram;  Surgeon: Algernon Huxley, MD;  Location: [facility];  Service: Cardiovascular;  Laterality: Left;  . PERIPHERAL VASCULAR CATHETERIZATION N/A 03/09/2015   Procedure: A/V Shunt Intervention;  Surgeon: Algernon Huxley, MD;  Location: [facility];  Service: Cardiovascular;  Laterality: N/A;  . PERIPHERAL VASCULAR CATHETERIZATION Left 04/17/2015   Procedure: Upper Extremity Angiography;  Surgeon: Algernon Huxley, MD;  Location: [place] [facility];  Service: Cardiovascular;  Laterality: Left;  . PERIPHERAL VASCULAR CATHETERIZATION  04/17/2015   Procedure: Upper Extremity Intervention;  Surgeon: Algernon Huxley, MD;  Location: [place] [facility];  Service: Cardiovascular;;  . PERIPHERAL VASCULAR CATHETERIZATION N/A 08/10/2015   Procedure: A/V Shuntogram/Fistulagram;  Surgeon: Algernon Huxley, MD;  Location: [facility] [facility];  Service: Cardiovascular;  Laterality: N/A;  . PERIPHERAL VASCULAR CATHETERIZATION N/A 08/10/2015   Procedure: A/V Shunt Intervention;  Surgeon: Algernon Huxley, MD;  Location: [place];  Service: Cardiovascular;  Laterality: N/A;  . PERIPHERAL VASCULAR CATHETERIZATION Left 04/11/2016   Procedure: A/V Shuntogram/Fistulagram;  Surgeon: Algernon Huxley, MD;  Location: [place] [facility];  Service: Cardiovascular;  Laterality: Left;    Allergies Tape  Social History  Social History   Tobacco Use  . Smoking status: Former Smoker    Packs/day: 0.00    Types: Cigarettes  . Smokeless tobacco: Never Used  Substance Use Topics  . Alcohol use: No  . Drug  use: No    Review of Systems Constitutional: Negative for fever. Cardiovascular: Negative for chest pain. Respiratory: Negative for shortness of breath. Gastrointestinal: Positive for abdominal pain, vomiting and diarrhea Genitourinary: Negative for dysuria. Musculoskeletal: Negative for back pain. Skin: Negative for rash. Neurological: Negative for headaches, focal weakness or numbness.  All systems negative/normal/unremarkable except as stated in the HPI  ____________________________________________   PHYSICAL EXAM:  VITAL SIGNS: ED Triage Vitals  Enc Vitals Group     BP 06/27/17 1200 (!) 211/161     Pulse Rate 06/27/17 1200 73     Resp 06/27/17 1200 16     Temp 06/27/17 1200 98.3 F (36.8 C)     Temp Source 06/27/17 1200 Oral     SpO2 06/27/17 1200 93 %     Weight 06/27/17 1203 230 lb (104.3 kg)     Height 06/27/17 1203 5\' 7"  (1.702 m)     Head Circumference --      Peak Flow --      Pain Score 06/27/17 1207 5     Pain Loc --      Pain Edu? --      Excl. in [place]? --     Constitutional: Alert and oriented.  Mild distress Eyes: Conjunctivae are normal. Normal extraocular movements. ENT   Head: Normocephalic and atraumatic.   Nose: No congestion/rhinnorhea.   Mouth/Throat: Mucous membranes are moist.   Neck: No stridor. Cardiovascular: Normal rate, regular rhythm. No murmurs, rubs, or gallops. Respiratory: Normal respiratory effort without tachypnea nor retractions. Breath sounds are clear and equal bilaterally. No wheezes/rales/rhonchi. Gastrointestinal: Nonfocal tenderness, normal bowel sounds. Musculoskeletal: Nontender with normal range of motion in extremities. No lower extremity tenderness nor edema. Neurologic:  Normal speech and language. No gross focal neurologic deficits are appreciated.  Skin:  Skin is warm, dry and intact. No rash noted. Psychiatric: Mood and affect are normal. Speech and behavior are normal.   ____________________________________________  EKG: Interpreted by me.  Sinus rhythm the rate of 98 bpm, nonspecific ST segment changes, long QT, normal axis.  ____________________________________________  ED COURSE:  Pertinent labs & imaging results that were available during my care of the patient were reviewed by me and considered in my medical decision making (see chart for details). Patient presents for abdominal pain with vomiting and diarrhea, we will assess with labs and imaging as indicated.   Procedures ____________________________________________   LABS (pertinent positives/negatives)  Labs Reviewed  URINALYSIS, COMPLETE (UACMP) WITH MICROSCOPIC - Abnormal; Notable for the following components:      Result Value   Color, Urine YELLOW (*)    APPearance CLOUDY (*)    pH 9.0 (*)    Glucose, UA >=500 (*)    Protein, ur >=300 (*)    Squamous Epithelial / LPF 0-5 (*)    All other components within normal limits  COMPREHENSIVE METABOLIC PANEL - Abnormal; Notable for the following components:   Chloride 96 (*)    Glucose, Bld 127 (*)    BUN 39 (*)    Creatinine, Ser 7.77 (*)    Total Protein 8.4 (*)    GFR calc non Af Amer 5 (*)    GFR calc Af Amer 6 (*)    Anion gap 19 (*)  All other components within normal limits  TROPONIN I - Abnormal; Notable for the following components:   Troponin I 0.07 (*)    All other components within normal limits  LIPASE, BLOOD  CBC WITH DIFFERENTIAL/PLATELET  PH, GASTRIC FLUID (GASTROCCULT CARD)   CRITICAL CARE Performed by: Earleen Newport   Total critical care time: 30 minutes  Critical care time was exclusive of separately billable procedures and treating other patients.  Critical care was necessary to treat or prevent imminent or life-threatening deterioration.  Critical care was time spent personally by me on the following activities: development of treatment plan with patient and/or surrogate as well as nursing,  discussions with consultants, evaluation of patient's response to treatment, examination of patient, obtaining history from patient or surrogate, ordering and performing treatments and interventions, ordering and review of laboratory studies, ordering and review of radiographic studies, pulse oximetry and re-evaluation of patient's condition.  RADIOLOGY Images were viewed by me  CT of the abdomen and pelvis with IV contrast IMPRESSION: 1. Fluid-filled distention of the stomach associated with a small to moderate-sized hiatal hernia. Question gastroparesis. Presence of hiatal hernia may predispose the patient to reflux given the fluid distention of the stomach. 2. Stable splenic 16 mm hypodensity at the hilum likely to represent a small splenic cyst. 3. 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 4. 17 mm submucosal lipoma within the cecum. 5. Simple 3.6 cm left ovarian cyst. Given stability since 2014, findings likely represent a benign etiology. 6. Degenerative disc disease L5-S1. ____________________________________________  DIFFERENTIAL DIAGNOSIS   Gastroenteritis, electrolyte abnormality, gastroparesis, dehydration, renal colic, occult infection, AAA  FINAL ASSESSMENT AND PLAN  Abdominal pain, vomiting and diarrhea, gastroparesis   Plan: Patient had presented for abdominal pain with vomiting and diarrhea.  Blood pressure was also noted to be markedly elevated.. Patient's labs revealed an elevated troponin likely demand related. Patient's imaging does reveal what looks like gastroparesis.  She is currently feeling better and we have used multiple doses of IV antiemetics and finally have given some Reglan.  She also received some morphine for pain.  She will require at least observation and possibly dialysis tomorrow with repeat troponins.  I will discuss with the hospitalist for admission.   Earleen Newport, MD   Note: This note was generated in part or whole with  voice recognition software. Voice recognition is usually quite accurate but there are transcription errors that can and very often do occur. I apologize for any typographical errors that were not detected and corrected.     Earleen Newport, MD 06/27/17 1540

## 2017-06-28 DIAGNOSIS — I639 Cerebral infarction, unspecified: Principal | ICD-10-CM

## 2017-06-28 LAB — BASIC METABOLIC PANEL
ANION GAP: 19 — AB (ref 5–15)
BUN: 65 mg/dL — ABNORMAL HIGH (ref 6–20)
CHLORIDE: 94 mmol/L — AB (ref 101–111)
CO2: 24 mmol/L (ref 22–32)
CREATININE: 11.06 mg/dL — AB (ref 0.44–1.00)
Calcium: 9.1 mg/dL (ref 8.9–10.3)
GFR calc non Af Amer: 3 mL/min — ABNORMAL LOW (ref >60)
GFR, EST AFRICAN AMERICAN: 4 mL/min — AB (ref >60)
Glucose, Bld: 139 mg/dL — ABNORMAL HIGH (ref 65–99)
Potassium: 3.7 mmol/L (ref 3.5–5.1)
SODIUM: 137 mmol/L (ref 135–145)

## 2017-06-28 LAB — GLUCOSE, CAPILLARY
GLUCOSE-CAPILLARY: 192 mg/dL — AB (ref 65–99)
Glucose-Capillary: 120 mg/dL — ABNORMAL HIGH (ref 65–99)
Glucose-Capillary: 175 mg/dL — ABNORMAL HIGH (ref 65–99)
Glucose-Capillary: 162 mg/dL — ABNORMAL HIGH (ref 65–99)
Glucose-Capillary: 150 mg/dL — ABNORMAL HIGH (ref 65–99)

## 2017-06-28 LAB — PHOSPHORUS
PHOSPHORUS: 7.4 mg/dL — AB (ref 2.5–4.6)
Phosphorus: 7 mg/dL — ABNORMAL HIGH (ref 2.5–4.6)

## 2017-06-28 LAB — MAGNESIUM: MAGNESIUM: 1.9 mg/dL (ref 1.7–2.4)

## 2017-06-28 MED ORDER — ASPIRIN EC 81 MG PO TBEC
81.0000 mg | DELAYED_RELEASE_TABLET | Freq: Every day | ORAL | Status: DC
  Administered 2017-06-28 – 2017-07-01 (×4): 81 mg via ORAL
  Filled 2017-06-28 (×4): qty 1

## 2017-06-28 MED ORDER — METOCLOPRAMIDE HCL 5 MG/ML IJ SOLN
5.0000 mg | Freq: Three times a day (TID) | INTRAMUSCULAR | Status: DC
  Administered 2017-06-28 – 2017-07-01 (×6): 5 mg via INTRAVENOUS
  Filled 2017-06-28 (×6): qty 2

## 2017-06-28 MED ORDER — ORAL CARE MOUTH RINSE
15.0000 mL | Freq: Two times a day (BID) | OROMUCOSAL | Status: DC
  Administered 2017-06-30: 15 mL via OROMUCOSAL

## 2017-06-28 MED ORDER — STERILE WATER FOR INJECTION IJ SOLN
INTRAMUSCULAR | Status: AC
  Administered 2017-06-28: 10 mL
  Filled 2017-06-28: qty 10

## 2017-06-28 MED ORDER — CHLORHEXIDINE GLUCONATE 0.12 % MT SOLN
15.0000 mL | Freq: Two times a day (BID) | OROMUCOSAL | Status: DC
  Administered 2017-06-28 – 2017-07-01 (×6): 15 mL via OROMUCOSAL
  Filled 2017-06-28 (×6): qty 15

## 2017-06-28 MED ORDER — HYDRALAZINE HCL 20 MG/ML IJ SOLN: 10.0000 mg | INTRAMUSCULAR | Status: DC | PRN

## 2017-06-28 MED ORDER — HEPARIN SODIUM (PORCINE) 5000 UNIT/ML IJ SOLN
5000.0000 [IU] | Freq: Three times a day (TID) | INTRAMUSCULAR | Status: DC
  Administered 2017-06-28 – 2017-07-01 (×7): 5000 [IU] via SUBCUTANEOUS
  Filled 2017-06-28 (×7): qty 1

## 2017-06-28 NOTE — Progress Notes (Signed)
Family is not comfortable signing consent for dialysis today.  Answered multiple questions about volume issues, blood pressure control.  Patient's daughter would like to postpone the dialysis for tomorrow. Potassium is 3.7.  Patient's volume status is acceptable. We will plan on dialysis tomorrow

## 2017-06-28 NOTE — Progress Notes (Addendum)
Patient seen with Dr Alva Garnet. Family updated on current treatment plan. Awaiting bed availability from [facility]. Per Dr. Alva Garnet, patient can be transferred to SDU, Tele or med-surg with telemetry. Call [facility] transfer center and updated the transfer status. Awaiting call back from [facility]. Accepting MD is Dr. Jeanne Ivan. [facility] transfer # [phone].  Patient and family updated.  Patient seen by neurology and nephrology. Plan is for HD in am if family signs consent.  Keep SBP 160-180 and give prn labetalol for SBP>180.  Rest of treatment plan unchanged Will continue to monitor

## 2017-06-28 NOTE — Consult Note (Signed)
Referring Physician: Alva Garnet    Chief Complaint: AMS  HPI: Cynthia Dean is an 66 y.o. female with ESRD who having nausea and vomiting for day and a half prior to admission.  She was sleeping a lot and was noted by family to have slurred speech as well.  BP was  markedly elevated.  Patient admitted and BP addressed.  Mental status has improved as well as speech but not felt to be completely back to baseline.  Imaging performed and consult called for further recommendations.  Current NIHSS of 0.  Date last known well: Unable to determine Time last known well: Unable to determine tPA Given: No: Unable to determine LKW  Past Medical History:  Diagnosis Date  . Chronic kidney disease   . COPD (chronic obstructive pulmonary disease) ([place])   . Coronary artery disease   . Diabetes mellitus without complication ([place])   . Heart murmur   . Hyperlipidemia   . Hypertension     Past Surgical History:  Procedure Laterality Date  . A/V FISTULAGRAM Left 12/20/2016   Procedure: A/V Fistulagram;  Surgeon: Katha Cabal, MD;  Location: [place] [facility];  Service: Cardiovascular;  Laterality: Left;  . A/V SHUNT INTERVENTION N/A 12/20/2016   Procedure: A/V Shunt Intervention;  Surgeon: Katha Cabal, MD;  Location: [place] [facility];  Service: Cardiovascular;  Laterality: N/A;  . BREAST BIOPSY Bilateral 07/19/00   neg  . PERIPHERAL VASCULAR CATHETERIZATION Left 02/02/2015   Procedure: A/V Shuntogram/Fistulagram;  Surgeon: Algernon Huxley, MD;  Location: [place] [facility];  Service: Cardiovascular;  Laterality: Left;  . PERIPHERAL VASCULAR CATHETERIZATION Left 02/02/2015   Procedure: A/V Shunt Intervention;  Surgeon: Algernon Huxley, MD;  Location: [place] [facility];  Service: Cardiovascular;  Laterality: Left;  . PERIPHERAL VASCULAR CATHETERIZATION Left 03/09/2015   Procedure: A/V Shuntogram/Fistulagram;  Surgeon: Algernon Huxley, MD;  Location: [place] [facility];  Service:  Cardiovascular;  Laterality: Left;  . PERIPHERAL VASCULAR CATHETERIZATION N/A 03/09/2015   Procedure: A/V Shunt Intervention;  Surgeon: Algernon Huxley, MD;  Location: [place] [facility];  Service: Cardiovascular;  Laterality: N/A;  . PERIPHERAL VASCULAR CATHETERIZATION Left 04/17/2015   Procedure: Upper Extremity Angiography;  Surgeon: Algernon Huxley, MD;  Location: [place] [facility];  Service: Cardiovascular;  Laterality: Left;  . PERIPHERAL VASCULAR CATHETERIZATION  04/17/2015   Procedure: Upper Extremity Intervention;  Surgeon: Algernon Huxley, MD;  Location: [place] [facility];  Service: Cardiovascular;;  . PERIPHERAL VASCULAR CATHETERIZATION N/A 08/10/2015   Procedure: A/V Shuntogram/Fistulagram;  Surgeon: Algernon Huxley, MD;  Location: [place] [facility];  Service: Cardiovascular;  Laterality: N/A;  . PERIPHERAL VASCULAR CATHETERIZATION N/A 08/10/2015   Procedure: A/V Shunt Intervention;  Surgeon: Algernon Huxley, MD;  Location: [place] [facility];  Service: Cardiovascular;  Laterality: N/A;  . PERIPHERAL VASCULAR CATHETERIZATION Left 04/11/2016   Procedure: A/V Shuntogram/Fistulagram;  Surgeon: Algernon Huxley, MD;  Location: [place] [facility];  Service: Cardiovascular;  Laterality: Left;    Family History  Problem Relation Age of Onset  . Breast cancer Father 70  . Stroke Mother   . Heart attack Mother   . Heart Problems Sister   . Breast cancer Cousin 23       1 st cousin. Maternal    Social History:  reports that she has quit smoking. Her smoking use included cigarettes. She smoked 0.00 packs per day. she has never used smokeless tobacco. She reports that  she does not drink alcohol or use drugs.  Allergies:  Allergies  Allergen Reactions  . Tape Itching    Skin Dermatitis/itching (tape adhesive)    Medications:  I have reviewed the patient's current medications. Prior to Admission:  Medications Prior to Admission  Medication Sig Dispense Refill Last Dose  . aspirin 81 MG tablet  Take 81 mg by mouth daily.   06/27/2017 at Unknown time  . atorvastatin (LIPITOR) 80 MG tablet Take 80 mg by mouth every evening.    06/27/2017 at Unknown time  . cinacalcet (SENSIPAR) 60 MG tablet Take 60 mg by mouth daily.   06/27/2017 at Unknown time  . clopidogrel (PLAVIX) 75 MG tablet TAKE ONE TABLET BY MOUTH ONCE DAILY 30 tablet 5 06/27/2017 at Unknown time  . gabapentin (NEURONTIN) 100 MG capsule Take 100 mg by mouth 3 (three) times daily.   06/27/2017 at Unknown time  . insulin NPH-regular Human (NOVOLIN 70/30) (70-30) 100 UNIT/ML injection Inject 40 Units into the skin 2 (two) times daily with a meal. Except dialysis days. Do not take on Tuesdays, Thursdays, and Saturdays.   06/27/2017 at Unknown time  . irbesartan (AVAPRO) 150 MG tablet Take 1 tablet by mouth daily.   06/27/2017  . metoprolol succinate (TOPROL-XL) 100 MG 24 hr tablet Take 100 mg by mouth every evening. Take with or immediately following a meal.    06/27/2017 at Unknown time  . omeprazole (PRILOSEC) 20 MG capsule Take 20 mg by mouth 2 (two) times daily.    06/27/2017 at Unknown time  . pregabalin (LYRICA) 50 MG capsule Take 50 mg by mouth 2 (two) times daily.   06/27/2017 at Unknown time  . sevelamer carbonate (RENVELA) 800 MG tablet Take 2,400 mg by mouth 3 (three) times daily with meals.    06/27/2017 at Unknown time  . valsartan (DIOVAN) 160 MG tablet Take 160 mg by mouth every evening.    06/27/2017 at Unknown time  . albuterol-ipratropium (COMBIVENT) 18-103 MCG/ACT inhaler Inhale 2 puffs into the lungs 2 (two) times daily.   prn at prn  . ipratropium-albuterol (DUONEB) 0.5-2.5 (3) MG/3ML SOLN Take 3 mLs by nebulization every 6 (six) hours as needed (shortness of breath/ wheezing).    prn at prn  . lidocaine-prilocaine (EMLA) cream Apply 1 application topically as needed (port access).    prn at prn   Scheduled: . aspirin EC  81 mg Oral Daily  . chlorhexidine  15 mL Mouth Rinse BID  . gabapentin  100 mg Oral TID  . heparin  injection (subcutaneous)  5,000 Units Subcutaneous Q8H  . insulin aspart  0-5 Units Subcutaneous QHS  . insulin aspart  0-9 Units Subcutaneous TID WC  . mouth rinse  15 mL Mouth Rinse q12n4p  . metoCLOPramide (REGLAN) injection  5 mg Intravenous Q8H  . metoprolol succinate  100 mg Oral QPM  . pantoprazole (PROTONIX) IV  40 mg Intravenous Q12H    ROS: History obtained from the patient  General ROS: negative for - chills, fatigue, fever, night sweats, weight gain or weight loss Psychological ROS: negative for - behavioral disorder, hallucinations, memory difficulties, mood swings or suicidal ideation Ophthalmic ROS: negative for - blurry vision, double vision, eye pain or loss of vision ENT ROS: negative for - epistaxis, nasal discharge, oral lesions, sore throat, tinnitus or vertigo Allergy and Immunology ROS: negative for - hives or itchy/watery eyes Hematological and Lymphatic ROS: negative for - bleeding problems, bruising or swollen lymph nodes Endocrine ROS: negative  for - galactorrhea, hair pattern changes, polydipsia/polyuria or temperature intolerance Respiratory ROS: negative for - cough, hemoptysis, shortness of breath or wheezing Cardiovascular ROS: negative for - chest pain, dyspnea on exertion, edema or irregular heartbeat Gastrointestinal ROS: nausea/vomiting Genito-Urinary ROS: negative for - dysuria, hematuria, incontinence or urinary frequency/urgency Musculoskeletal ROS: negative for - joint swelling or muscular weakness Neurological ROS: as noted in HPI Dermatological ROS: negative for rash and skin lesion changes  Physical Examination: Blood pressure (!) 171/59, pulse 86, temperature 98.7 F (37.1 C), temperature source Oral, resp. rate (!) 22, height 5\' 6"  (1.676 m), weight 121.6 kg (268 lb 1.3 oz), SpO2 93 %.  HEENT-  Normocephalic, no lesions, without obvious abnormality.  Normal external eye and conjunctiva.  Normal TM's bilaterally.  Normal auditory canals and  external ears. Normal external nose, mucus membranes and septum.  Normal pharynx. Cardiovascular- S1, S2 normal, pulses palpable throughout   Lungs- chest clear, no wheezing, rales, normal symmetric air entry Abdomen- soft, non-tender; bowel sounds normal; no masses,  no organomegaly Extremities- BLE edema Lymph-no adenopathy palpable Musculoskeletal-no joint tenderness, deformity or swelling Skin-bruise on right knee  Neurological Examination   Mental Status: Alert, oriented, thought content appropriate.  Speech fluent without evidence of aphasia but slow and deliberate.  Able to follow 3 step commands with minor prompting. Cranial Nerves: II: Discs flat bilaterally; Visual fields grossly normal, pupils equal, round, reactive to light and accommodation III,IV, VI: ptosis not present, extra-ocular motions intact bilaterally V,VII: decrease in left NLF, facial light touch sensation normal bilaterally VIII: hearing normal bilaterally IX,X: gag reflex present XI: bilateral shoulder shrug XII: midline tongue extension Motor: Right : Upper extremity   5/5    Left:     Upper extremity   5/5  Lower extremity   5/5     Lower extremity   5/5 Tone and bulk:normal tone throughout; no atrophy noted Sensory: Pinprick and light touch intact throughout, bilaterally Deep Tendon Reflexes: 2+ and symmetric with absent AJ's bilaterally Plantars: Right: downgoing   Left: downgoing Cerebellar: normal finger-to-nose, normal rapid alternating movements and normal heel-to-shin test Gait: not tested due to safety concerns    Laboratory Studies:  Basic Metabolic Panel: Recent Labs  Lab 06/27/17 1307 06/28/17 1041  NA 142  --   K 3.7  --   CL 96*  --   CO2 27  --   GLUCOSE 127*  --   BUN 39*  --   CREATININE 7.77*  --   CALCIUM 10.1  --   PHOS  --  7.0*    Liver Function Tests: Recent Labs  Lab 06/27/17 1307  AST 26  ALT 17  ALKPHOS 91  BILITOT 0.7  PROT 8.4*  ALBUMIN 4.1   Recent  Labs  Lab 06/27/17 1307  LIPASE 39   No results for input(s): AMMONIA in the last 168 hours.  CBC: Recent Labs  Lab 06/27/17 1307  WBC 12.7*  NEUTROABS 11.2*  HGB 11.7*  HCT 35.7  MCV 88.6  PLT 254    Cardiac Enzymes: Recent Labs  Lab 06/27/17 1307  TROPONINI 0.07*    BNP: Invalid input(s): POCBNP  CBG: Recent Labs  Lab 06/27/17 1105 06/27/17 1748 06/27/17 2105 06/28/17 0729 06/28/17 1156  GLUCAP 154* 120* 158* 175* 162*    Microbiology: Results for orders placed or performed during the hospital encounter of 06/27/17  MRSA PCR Screening     Status: None   Collection Time: 06/27/17  6:10 PM  Result Value  Ref Range Status   MRSA by PCR NEGATIVE NEGATIVE Final    Comment:        The GeneXpert MRSA Assay (FDA approved for NASAL specimens only), is one component of a comprehensive MRSA colonization surveillance program. It is not intended to diagnose MRSA infection nor to guide or monitor treatment for MRSA infections.     Coagulation Studies: No results for input(s): LABPROT, INR in the last 72 hours.  Urinalysis:  Recent Labs  Lab 06/27/17 1310  COLORURINE YELLOW*  LABSPEC 1.006  PHURINE 9.0*  GLUCOSEU >=500*  HGBUR NEGATIVE  BILIRUBINUR NEGATIVE  KETONESUR NEGATIVE  PROTEINUR >=300*  NITRITE NEGATIVE  LEUKOCYTESUR NEGATIVE    Lipid Panel:    Component Value Date/Time   CHOL 89 04/15/2016 0606   CHOL 78 12/25/2013 0413   TRIG 159 (H) 04/15/2016 0606   TRIG 53 12/25/2013 0413   HDL 25 (L) 04/15/2016 0606   HDL 31 (L) 12/25/2013 0413   CHOLHDL 3.6 04/15/2016 0606   VLDL 32 04/15/2016 0606   VLDL 11 12/25/2013 0413   LDLCALC 32 04/15/2016 0606   LDLCALC 36 12/25/2013 0413    HgbA1C:  Lab Results  Component Value Date   HGBA1C 5.8 (H) 04/15/2016    Urine Drug Screen:  No results found for: LABOPIA, COCAINSCRNUR, LABBENZ, AMPHETMU, THCU, LABBARB  Alcohol Level: No results for input(s): ETH in the last 168 hours.  Other  results: EKG: sinus rhythm at 98 bpm with premature atrial complexes noted  Imaging: Ct Head Wo Contrast  Result Date: 06/27/2017 CLINICAL DATA:  Altered level of consciousness. EXAM: CT HEAD WITHOUT CONTRAST TECHNIQUE: Contiguous axial images were obtained from the base of the skull through the vertex without intravenous contrast. COMPARISON:  CT scan of April 14, 2016. MRI of April 15, 2016. FINDINGS: Brain: Mild chronic ischemic white matter disease is noted. Old right cerebellar infarction is noted. No midline shift is noted. Ventricular size is within normal limits. Old left posterior parietal infarction is noted. New cortical low density is noted in right frontal lobe concerning for infarction of indeterminate age. Vascular: No hyperdense vessel or unexpected calcification. Skull: Normal. Negative for fracture or focal lesion. Sinuses/Orbits: No acute finding. Other: None. IMPRESSION: New focal low density is seen in right frontal lobe concerning for infarction of indeterminate age. MRI may be performed for further evaluation. Electronically Signed   By: Marijo Conception, M.D.   On: 06/27/2017 17:03   Mr Brain Wo Contrast  Result Date: 06/27/2017 CLINICAL DATA:  66 y/o F; altered level of consciousness and abnormal CT of head. EXAM: MRI HEAD WITHOUT CONTRAST TECHNIQUE: Multiplanar, multiecho pulse sequences of the brain and surrounding structures were obtained without intravenous contrast. COMPARISON:  04/15/2016 MRI head.  06/27/2017 CT head. FINDINGS: Brain:  Moderate motion degradation of multiple sequences. New region of hypoattenuation on CT corresponds to a infarction in the right anterolateral frontal lobe measuring 3.0 x 2.9 x 3.2 cm (volume = 15 cm^3) with increased T2 FLAIR signal, mild local mass effect, and heterogeneous diffusion, likely subacute. Stable chronic small left parietal lobe infarction, chronic microvascular ischemic changes of white matter, and parenchymal volume loss  of the brain. No intracranial hemorrhage, herniation, or effacement of basilar cisterns. Vascular: Normal flow voids. Skull and upper cervical spine: Normal marrow signal. Sinuses/Orbits: Negative. Other: None. IMPRESSION: 1. Right anterolateral frontal lobe subacute infarction measuring 15 cc. No associated hemorrhage or significant mass effect. 2. Stable small chronic left parietal infarction, microvascular ischemic changes  of white matter, and parenchymal volume loss of the brain. Electronically Signed   By: Kristine Garbe M.D.   On: 06/27/2017 22:49   Ct Abdomen Pelvis W Contrast  Result Date: 06/27/2017 CLINICAL DATA:  66 year old female on dialysis presenting with nausea, vomiting and diarrhea since Sunday. EXAM: CT ABDOMEN AND PELVIS WITH CONTRAST TECHNIQUE: Multidetector CT imaging of the abdomen and pelvis was performed using the standard protocol following bolus administration of intravenous contrast. CONTRAST:  171mL ISOVUE-300 IOPAMIDOL (ISOVUE-300) INJECTION 61% COMPARISON:  05/12/2013 FINDINGS: Lower chest: Stable cardiomegaly. Passive atelectasis at the lung bases. Small to moderate-sized hiatal hernia. Hepatobiliary: No focal liver abnormality is seen. No gallstones, gallbladder wall thickening, or biliary dilatation. Pancreas: Unremarkable. No pancreatic ductal dilatation or surrounding inflammatory changes. Spleen: No splenomegaly. Stable 16 mm hypodensity at the splenic hilum likely to represent a small splenic cyst. 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 Normal right adrenal gland. Renovascular calcifications are noted at at the hila of both kidneys with perinephric fat stranding, chronic in appearance. No obstructive uropathy. No enhancing renal masses. The urinary bladder is decompressed. Stomach/Bowel: The stomach is distended with fluid and may reflect gastroparesis. Normal small bowel rotation  without mural thickening or obstruction. Small 17 mm submucosal lipoma in the cecum. Normal appendix. No acute abnormality of the colon. Vascular/Lymphatic: Moderate aortoiliac and branch vessel atherosclerosis. Reproductive: Simple 3.6 cm left ovarian cyst. The uterus and right adnexa are unremarkable. Other: No free air free fluid. Musculoskeletal: Degenerative disc disease L5-S1 with partially calcified broad-based disc bulge. No acute nor suspicious osseous abnormality. IMPRESSION: 1. Fluid-filled distention of the stomach associated with a small to moderate-sized hiatal hernia. Question gastroparesis. Presence of hiatal hernia may predispose the patient to reflux given the fluid distention of the stomach. 2. Stable splenic 16 mm hypodensity at the hilum likely to represent a small splenic cyst. 3. 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 4. 17 mm submucosal lipoma within the cecum. 5. Simple 3.6 cm left ovarian cyst. Given stability since 2014, findings likely represent a benign etiology. 6. Degenerative disc disease L5-S1. Electronically Signed   By: Ashley Royalty M.D.   On: 06/27/2017 14:54    Assessment: 66 y.o. female with multiple medical problems experiencing nausea, vomiting, elevated BP and mental status changes.  MRI of the brain reviewed and shows a right subacute anterolateral frontal lobe infarct.  This is likely a consequence of the patient's markedly elevated BP and hypertensive encephalopathy.  Patient on ASA, Plavix and statin at home.  Further work up recommended.    Stroke Risk Factors - hyperlipidemia and hypertension  Plan: 1. HgbA1c, fasting lipid panel 2. PT consult, OT consult, Speech consult 3. Echocardiogram 4. Carotid dopplers 5. Prophylactic therapy-Continue ASA and Plavix 6. NPO until RN stroke swallow screen 7. Telemetry monitoring 8. Frequent neuro checks    Alexis Goodell, MD Neurology [phone] 06/28/2017, 4:33 PM

## 2017-06-28 NOTE — Care Management (Signed)
CM informed that patient to transfer to [facility]. Unable to find documentation that a transfer has been initiated. Contacted [facility] transfer center and informed patient has been accepted but there are no beds and bed situation is so tight that it may be "quite a while" until there is an available bed." Informed patient's daughter and advised if still wishes to transfer to another facility to inform patient's primary nurse and attending.  Explained medical necessity to  daughter [name] and how it medicare may not cover a transfer to a facility that can be provided at the current facility. Updated primary nurse

## 2017-06-28 NOTE — Progress Notes (Signed)
[facility] [place] NAME: Cynthia Dean    MR#:  [id number]  DATE OF BIRTH:  1950/12/17  SUBJECTIVE:   Pt. Here due to intractable N/V and also noted to have coffee ground emesis. 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  Family at bedside and want her transferred to [facility].    REVIEW OF SYSTEMS:    Review of Systems  Constitutional: Negative for chills and fever.  HENT: Negative for congestion and tinnitus.   Eyes: Negative for blurred vision and double vision.  Respiratory: Negative for cough, shortness of breath and wheezing.   Cardiovascular: Negative for chest pain, orthopnea and PND.  Gastrointestinal: Positive for nausea and vomiting. Negative for abdominal pain and diarrhea.  Genitourinary: Negative for dysuria and hematuria.  Neurological: Negative for dizziness, sensory change and focal weakness.  All other systems reviewed and are negative.   Nutrition: NPO Tolerating Diet: Yes Tolerating PT: Await Eval.   DRUG ALLERGIES:   Allergies  Allergen Reactions  . Tape Itching    Skin Dermatitis/itching (tape adhesive)    VITALS:  Blood pressure (!) 175/63, pulse 87, temperature 99.1 F (37.3 C), temperature source Oral, resp. rate 19, height 5\' 6"  (1.676 m), weight 121.6 kg (268 lb 1.3 oz), SpO2 94 %.  PHYSICAL EXAMINATION:   Physical Exam  GENERAL:  66 y.o.-year-old patient lying in bed in no acute distress.  EYES: Pupils equal, round, reactive to light and accommodation. No scleral icterus. Extraocular muscles intact.  HEENT: Head atraumatic, normocephalic. Oropharynx and nasopharynx clear.  NECK:  Supple, no jugular venous distention. No thyroid enlargement, no tenderness.  LUNGS: Normal breath sounds bilaterally, no wheezing, rales, rhonchi. No use of accessory muscles of respiration.  CARDIOVASCULAR: S1, S2 normal. No murmurs, rubs, or gallops.  ABDOMEN: Soft, nontender, nondistended.  Bowel sounds present. No organomegaly or mass.  EXTREMITIES: No cyanosis, clubbing or edema b/l.    NEUROLOGIC: Cranial nerves II through XII are intact. No focal Motor or sensory deficits b/l. Globally weak  PSYCHIATRIC: The patient is alert and oriented x 3.  SKIN: No obvious rash, lesion, or ulcer.   Left Upper Ext. AV fistula with good bruit and thrill.    LABORATORY PANEL:   CBC Recent Labs  Lab 06/27/17 1307  WBC 12.7*  HGB 11.7*  HCT 35.7  PLT 254   ------------------------------------------------------------------------------------------------------------------  Chemistries  Recent Labs  Lab 06/27/17 1307  NA 142  K 3.7  CL 96*  CO2 27  GLUCOSE 127*  BUN 39*  CREATININE 7.77*  CALCIUM 10.1  AST 26  ALT 17  ALKPHOS 91  BILITOT 0.7   ------------------------------------------------------------------------------------------------------------------  Cardiac Enzymes Recent Labs  Lab 06/27/17 1307  TROPONINI 0.07*   ------------------------------------------------------------------------------------------------------------------  RADIOLOGY:  Ct Head Wo Contrast  Result Date: 06/27/2017 CLINICAL DATA:  Altered level of consciousness. EXAM: CT HEAD WITHOUT CONTRAST TECHNIQUE: Contiguous axial images were obtained from the base of the skull through the vertex without intravenous contrast. COMPARISON:  CT scan of April 14, 2016. MRI of April 15, 2016. FINDINGS: Brain: Mild chronic ischemic white matter disease is noted. Old right cerebellar infarction is noted. No midline shift is noted. Ventricular size is within normal limits. Old left posterior parietal infarction is noted. New cortical low density is noted in right frontal lobe concerning for infarction of indeterminate age. Vascular: No hyperdense vessel or unexpected calcification. Skull: Normal. Negative for fracture or focal lesion. Sinuses/Orbits: No  acute finding. Other: None. IMPRESSION: New focal  low density is seen in right frontal lobe concerning for infarction of indeterminate age. MRI may be performed for further evaluation. Electronically Signed   By: Marijo Conception, M.D.   On: 06/27/2017 17:03   Mr Brain Wo Contrast  Result Date: 06/27/2017 CLINICAL DATA:  66 y/o F; altered level of consciousness and abnormal CT of head. EXAM: MRI HEAD WITHOUT CONTRAST TECHNIQUE: Multiplanar, multiecho pulse sequences of the brain and surrounding structures were obtained without intravenous contrast. COMPARISON:  04/15/2016 MRI head.  06/27/2017 CT head. FINDINGS: Brain:  Moderate motion degradation of multiple sequences. New region of hypoattenuation on CT corresponds to a infarction in the right anterolateral frontal lobe measuring 3.0 x 2.9 x 3.2 cm (volume = 15 cm^3) with increased T2 FLAIR signal, mild local mass effect, and heterogeneous diffusion, likely subacute. Stable chronic small left parietal lobe infarction, chronic microvascular ischemic changes of white matter, and parenchymal volume loss of the brain. No intracranial hemorrhage, herniation, or effacement of basilar cisterns. Vascular: Normal flow voids. Skull and upper cervical spine: Normal marrow signal. Sinuses/Orbits: Negative. Other: None. IMPRESSION: 1. Right anterolateral frontal lobe subacute infarction measuring 15 cc. No associated hemorrhage or significant mass effect. 2. Stable small chronic left parietal infarction, microvascular ischemic changes of white matter, and parenchymal volume loss of the brain. Electronically Signed   By: Kristine Garbe M.D.   On: 06/27/2017 22:49   Ct Abdomen Pelvis W Contrast  Result Date: 06/27/2017 CLINICAL DATA:  66 year old female on dialysis presenting with nausea, vomiting and diarrhea since Sunday. EXAM: CT ABDOMEN AND PELVIS WITH CONTRAST TECHNIQUE: Multidetector CT imaging of the abdomen and pelvis was performed using the standard protocol following bolus administration of  intravenous contrast. CONTRAST:  164mL ISOVUE-300 IOPAMIDOL (ISOVUE-300) INJECTION 61% COMPARISON:  05/12/2013 FINDINGS: Lower chest: Stable cardiomegaly. Passive atelectasis at the lung bases. Small to moderate-sized hiatal hernia. Hepatobiliary: No focal liver abnormality is seen. No gallstones, gallbladder wall thickening, or biliary dilatation. Pancreas: Unremarkable. No pancreatic ductal dilatation or surrounding inflammatory changes. Spleen: No splenomegaly. Stable 16 mm hypodensity at the splenic hilum likely to represent a small splenic cyst. 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 Normal right adrenal gland. Renovascular calcifications are noted at at the hila of both kidneys with perinephric fat stranding, chronic in appearance. No obstructive uropathy. No enhancing renal masses. The urinary bladder is decompressed. Stomach/Bowel: The stomach is distended with fluid and may reflect gastroparesis. Normal small bowel rotation without mural thickening or obstruction. Small 17 mm submucosal lipoma in the cecum. Normal appendix. No acute abnormality of the colon. Vascular/Lymphatic: Moderate aortoiliac and branch vessel atherosclerosis. Reproductive: Simple 3.6 cm left ovarian cyst. The uterus and right adnexa are unremarkable. Other: No free air free fluid. Musculoskeletal: Degenerative disc disease L5-S1 with partially calcified broad-based disc bulge. No acute nor suspicious osseous abnormality. IMPRESSION: 1. Fluid-filled distention of the stomach associated with a small to moderate-sized hiatal hernia. Question gastroparesis. Presence of hiatal hernia may predispose the patient to reflux given the fluid distention of the stomach. 2. Stable splenic 16 mm hypodensity at the hilum likely to represent a small splenic cyst. 3. 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 4. 17 mm submucosal lipoma within the cecum. 5. Simple  3.6 cm left ovarian cyst. Given stability since 2014, findings likely represent a benign etiology. 6. Degenerative disc disease L5-S1. Electronically Signed   By: Shanon Brow  Randel Pigg M.D.   On: 06/27/2017 14:54     ASSESSMENT AND PLAN:   66 year old female with past medical history of end-stage renal disease on hemodialysis, essential hypertension, hyperlipidemia, diabetes, COPD who presented to the hospital due to abdominal pain nausea vomiting and also accelerated hypertension.  1. Acute CVA -this was noted on a MRI of the brain. This is likely acute ischemic CVA from uncontrolled hypertension. -Continue aspirin, statin. Await neurological evaluation.  2. Accelerated hypertension-improved since admission. -Continue IV labetalol, Toprol.  3. Intractable nausea and vomiting-secondary to hiatal hernia/gastroparesis is seen on the CT scan of the abdomen and pelvis on admission. -Continue supportive care with IV fluids, antiemetics. Patient had some coffee-ground emesis but hemoglobin is stable. Await gastroenterology evaluation.  4. End-stage renal disease on hemodialysis-nephrology has been consulted. Patient gets dialysis on Monday Wednesday Friday. Resume schedule as per nephrology.  5. Diabetes type 2 without complication-continue sliding scale insulin for now. Follow blood sugars.  6. Diabetic neuropathy-continue gabapentin.  7. GERD-continue Protonix.  Patient is being transferred to [facility] possibly as per family request as her primary care physicians are located there. Continue current care as mentioned above.     All the records are reviewed and case discussed with Care Management/Social Worker. Management plans discussed with the patient, family and they are in agreement.  CODE STATUS: Full  DVT Prophylaxis: Heparin subcutaneous  TOTAL TIME TAKING CARE OF THIS PATIENT: 30 minutes.   POSSIBLE D/C IN 2-3 DAYS, DEPENDING ON CLINICAL CONDITION.   Henreitta Leber M.D on 06/28/2017 at  3:11 PM  Between 7am to 6pm - Pager - [phone]  After 6pm go to [email or web address] - Proofreader  [facility]  Office  [phone]  CC: Primary care physician; Lowella Bandy, MD

## 2017-06-28 NOTE — Evaluation (Signed)
Clinical/Bedside Swallow Evaluation Patient Details  Name: Cynthia Dean MRN: [id number] Date of Birth: [birth year]  Today's Date: 06/28/2017 Time: SLP Start Time (ACUTE ONLY): 1600 SLP Stop Time (ACUTE ONLY): 1700 SLP Time Calculation (min) (ACUTE ONLY): 60 min  Past Medical History:  Past Medical History:  Diagnosis Date  . Chronic kidney disease   . COPD (chronic obstructive pulmonary disease) ([place])   . Coronary artery disease   . Diabetes mellitus without complication ([place])   . Heart murmur   . Hyperlipidemia   . Hypertension    Past Surgical History:  Past Surgical History:  Procedure Laterality Date  . A/V FISTULAGRAM Left 12/20/2016   Procedure: A/V Fistulagram;  Surgeon: Katha Cabal, MD;  Location: [place] [facility];  Service: Cardiovascular;  Laterality: Left;  . A/V SHUNT INTERVENTION N/A 12/20/2016   Procedure: A/V Shunt Intervention;  Surgeon: Katha Cabal, MD;  Location: [place] [facility];  Service: Cardiovascular;  Laterality: N/A;  . BREAST BIOPSY Bilateral 07/19/00   neg  . PERIPHERAL VASCULAR CATHETERIZATION Left 02/02/2015   Procedure: A/V Shuntogram/Fistulagram;  Surgeon: Algernon Huxley, MD;  Location: [place] [facility];  Service: Cardiovascular;  Laterality: Left;  . PERIPHERAL VASCULAR CATHETERIZATION Left 02/02/2015   Procedure: A/V Shunt Intervention;  Surgeon: Algernon Huxley, MD;  Location: [place] [facility];  Service: Cardiovascular;  Laterality: Left;  . PERIPHERAL VASCULAR CATHETERIZATION Left 03/09/2015   Procedure: A/V Shuntogram/Fistulagram;  Surgeon: Algernon Huxley, MD;  Location: [place] [facility];  Service: Cardiovascular;  Laterality: Left;  . PERIPHERAL VASCULAR CATHETERIZATION N/A 03/09/2015   Procedure: A/V Shunt Intervention;  Surgeon: Algernon Huxley, MD;  Location: [place] [facility];  Service: Cardiovascular;  Laterality: N/A;  . PERIPHERAL VASCULAR CATHETERIZATION Left 04/17/2015   Procedure: Upper Extremity Angiography;   Surgeon: Algernon Huxley, MD;  Location: [place] [facility];  Service: Cardiovascular;  Laterality: Left;  . PERIPHERAL VASCULAR CATHETERIZATION  04/17/2015   Procedure: Upper Extremity Intervention;  Surgeon: Algernon Huxley, MD;  Location: [place] [facility];  Service: Cardiovascular;;  . PERIPHERAL VASCULAR CATHETERIZATION N/A 08/10/2015   Procedure: A/V Shuntogram/Fistulagram;  Surgeon: Algernon Huxley, MD;  Location: [place] [facility];  Service: Cardiovascular;  Laterality: N/A;  . PERIPHERAL VASCULAR CATHETERIZATION N/A 08/10/2015   Procedure: A/V Shunt Intervention;  Surgeon: Algernon Huxley, MD;  Location: [place] [facility];  Service: Cardiovascular;  Laterality: N/A;  . PERIPHERAL VASCULAR CATHETERIZATION Left 04/11/2016   Procedure: A/V Shuntogram/Fistulagram;  Surgeon: Algernon Huxley, MD;  Location: [place] [facility];  Service: Cardiovascular;  Laterality: Left;   HPI:  Pt is a 66 y.o. female with a known history of end-stage renal disease on dialysis, DM, COPD, CAD, HTN.  She has been having nausea and vomiting for day and a half.  She states that her stomach is very upset.  She has been vomiting too many times to count.  They have noticed some blood in the vomit.  Also having some diarrhea.  She states the stool has been black.  She has been sleeping a lot.  She has had some right-sided abdominal pain but currently not having pain.  In the ER, she was found to have elevated blood pressure and nausea vomiting and hospitalist services were contacted for further evaluation.  Noted MRI results of Right anterolateral frontal lobe subacute infarction w/ ischemic changes of white matter, and parenchymal volume loss of the brain.  Passive atelectasis at lung bases;  moderate size Hiatal Hernia.  No overt dysarthria noted; speech wfl. Family present denies any confusion or language deficits at this time.    Assessment / Plan / Recommendation Clinical Impression  Pt appeared to present w/ adequate  oropharyngeal phase swallow function and is at reduced risk for aspiration when following general aspiration precautions. Pt consumed po trials of thin liquids, puree, and soft solids w/ no overt s/s of aspiration noted; O2 sats remained 95-96%; RR upper teens; HR mid80s. No decline in respiratory status was noted during/post trials. Oral phase appeared wfl for bolus management, A-P transfer and oral clearing post swallow. OM exam was [facility]. Pt fed self given setup assist. As pt appears to adequately tolerate po's, recommend a regular diet consistency(family will provide tray setup and cutting of foods as needed) w/ thin liquids; general aspiration precautions. Discussed swallowing Pills in puree for better management if needed while in the hospital. ST services willl be available for any further education while admitted; also available for Cognitive screening if indicated secondary to new stroke per MRI. NSG updated, agreed. Pt/family education given on above.  SLP Visit Diagnosis: Dysphagia, unspecified (R13.10)    Aspiration Risk  (reduced )    Diet Recommendation  Regular diet w/ thin liquids; general aspiration precautions; tray setup assist d/t deconditioning  Medication Administration: Whole meds with puree(if easier for swallowing at this time)    Other  Recommendations Recommended Consults: (TBD) Oral Care Recommendations: Oral care BID;Patient independent with oral care(assist) Other Recommendations: (n/a)   Follow up Recommendations None      Frequency and Duration    (n/a)       Prognosis Prognosis for Safe Diet Advancement: Good      Swallow Study   General Date of Onset: 06/27/17 HPI: Pt is a 66 y.o. female with a known history of end-stage renal disease on dialysis, DM, COPD, CAD, HTN.  She has been having nausea and vomiting for day and a half.  She states that her stomach is very upset.  She has been vomiting too many times to count.  They have noticed some blood in the  vomit.  Also having some diarrhea.  She states the stool has been black.  She has been sleeping a lot.  She has had some right-sided abdominal pain but currently not having pain.  In the ER, she was found to have elevated blood pressure and nausea vomiting and hospitalist services were contacted for further evaluation.  Noted MRI results of Right anterolateral frontal lobe subacute infarction w/ ischemic changes of white matter, and parenchymal volume loss of the brain.  Passive atelectasis at lung bases; moderate size Hiatal Hernia.  No overt dysarthria noted; speech wfl. Family present denies any confusion or language deficits at this time.  Type of Study: Bedside Swallow Evaluation Previous Swallow Assessment: none Diet Prior to this Study: Regular;Thin liquids Temperature Spikes Noted: (wbc elevated; temp 99) Respiratory Status: Nasal cannula(2-3 liters) History of Recent Intubation: No Behavior/Cognition: Alert;Cooperative;Pleasant mood Oral Cavity Assessment: Within Functional Limits Oral Care Completed by SLP: Recent completion by staff Oral Cavity - Dentition: (partials) Vision: Functional for self-feeding Self-Feeding Abilities: Able to feed self;Needs set up(min) Patient Positioning: Upright in bed Baseline Vocal Quality: Normal Volitional Cough: Strong Volitional Swallow: Able to elicit    Oral/Motor/Sensory Function Overall Oral Motor/Sensory Function: Within functional limits   Ice Chips Ice chips: Within functional limits Presentation: Spoon(fed 2 trials - had been eating w/ NSG)   Thin Liquid Thin Liquid: Within  functional limits Presentation: Cup(~4-5 ozs total)    Nectar Thick Nectar Thick Liquid: Not tested   Honey Thick Honey Thick Liquid: Not tested   Puree Puree: Within functional limits Presentation: Self Fed;Spoon(4 ozs)   Solid   GO   Solid: Within functional limits Presentation: Self Fed;Spoon(4 trials)    Functional Assessment Tool Used: clinical  judgement Functional Limitations: Swallowing(being hospitalized) Swallow Current Status ([id number]): At least 1 percent but less than 20 percent impaired, limited or restricted Swallow Goal Status [phone]): At least 1 percent but less than 20 percent impaired, limited or restricted Swallow Discharge Status [phone]): At least 1 percent but less than 20 percent impaired, limited or restricted     Orinda [place], MS, CCC-SLP Watson,Katherine 06/28/2017,7:04 PM

## 2017-06-28 NOTE — Progress Notes (Signed)
[place], [place] 06/28/17  Subjective:   Patient is known to our practice from outpatient dialysis.  She presented to the emergency room with nausea, vomiting and diarrhea.  According to admission notes, she has been vomiting frequently and not able to keep anything p.o.  Prior to admission, she vomited 5 times.  Per nursing reports she had dark vomitus in the hospital one time.  Patient is not able to provide any meaningful information as she is oriented only to self.  She has some expressive aphasia. Presenting blood pressure to the emergency room was 230/106.  CT scan of the abdomen shows fluid-filled distention of the stomach associated with small to moderate sized hiatal hernia.  CT scan of the head shows new focal low-density in the right frontal lobe concerning for infarction.  MRI of the brain shows right anterior lateral frontal lobe subacute infarction.  No associated hemorrhage or mass-effect.  Chronic left parietal infarction and microvascular ischemic changes of white matter and parenchymal volume loss of the brain was also seen. At present, patient is seen in the ICU.  She is does not have any acute complaints.  She is able to follow some simple commands.  She thinks she is in [facility] hospital  Nephrology consult is requested for evaluation of dialysis.  Her potassium is 3.7.  Calcium is 7.1.  BUN 39.  Bicarbonate 27.  She does not have any leg edema or shortness of breath.  She is on oxygen supplementation by nasal cannula but takes it off frequently.  Review of care everywhere records show that she had a Kenalog injection for right-sided carpal tunnel on November 29  Objective:  Vital signs in last 24 hours:  Temp:  [98.3 F (36.8 C)-99.3 F (37.4 C)] 99.1 F (37.3 C) (12/05 0739) Pulse Rate:  [73-100] 86 (12/05 0800) Resp:  [13-31] 22 (12/05 0739) BP: (129-230)/(53-161) 163/58 (12/05 0800) SpO2:  [88 %-99 %] 93 % (12/05 0800) Weight:  [104.3 kg  (230 lb)-121.6 kg (268 lb 1.3 oz)] 121.6 kg (268 lb 1.3 oz) (12/04 1800)  Weight change:  Filed Weights   06/27/17 1203 06/27/17 1800  Weight: 104.3 kg (230 lb) 121.6 kg (268 lb 1.3 oz)    Intake/Output:    Intake/Output Summary (Last 24 hours) at 06/28/2017 9924 Last data filed at 06/27/2017 1800 Gross per 24 hour  Intake -  Output 0 ml  Net 0 ml     Physical Exam: General:  Laying in the bed, no acute distress  HEENT  anicteric, moist oral mucous membrane  Neck  supple  Pulm/lungs  lungs are clear to auscultation, no effusion supplementation by nasal cannula  CVS/Heart  tachycardic, no rub or gallop  Abdomen:   Soft, nondistended  Extremities:  No peripheral edema  Neurologic:  Alert, able to follow simple commands, oriented only to self, appears to have expressive aphasia, grip strength appears normal bilaterally, has myoclonal movements in the torso.  Skin:  No acute rashes  Access:  Left upper arm AV graft, good bruit       Basic Metabolic Panel:  Recent Labs  Lab 06/27/17 1307  NA 142  K 3.7  CL 96*  CO2 27  GLUCOSE 127*  BUN 39*  CREATININE 7.77*  CALCIUM 10.1     CBC: Recent Labs  Lab 06/27/17 1307  WBC 12.7*  NEUTROABS 11.2*  HGB 11.7*  HCT 35.7  MCV 88.6  PLT 254     No results found for:  HEPBSAG, [place], [place]    Microbiology:  Recent Results (from the past 240 hour(s))  MRSA PCR Screening     Status: None   Collection Time: 06/27/17  6:10 PM  Result Value Ref Range Status   MRSA by PCR NEGATIVE NEGATIVE Final    Comment:        The GeneXpert MRSA Assay (FDA approved for NASAL specimens only), is one component of a comprehensive MRSA colonization surveillance program. It is not intended to diagnose MRSA infection nor to guide or monitor treatment for MRSA infections.     Coagulation Studies: No results for input(s): LABPROT, INR in the last 72 hours.  Urinalysis: Recent Labs    06/27/17 1310  COLORURINE YELLOW*   LABSPEC 1.006  PHURINE 9.0*  GLUCOSEU >=500*  HGBUR NEGATIVE  BILIRUBINUR NEGATIVE  KETONESUR NEGATIVE  PROTEINUR >=300*  NITRITE NEGATIVE  LEUKOCYTESUR NEGATIVE      Imaging: Ct Head Wo Contrast  Result Date: 06/27/2017 CLINICAL DATA:  Altered level of consciousness. EXAM: CT HEAD WITHOUT CONTRAST TECHNIQUE: Contiguous axial images were obtained from the base of the skull through the vertex without intravenous contrast. COMPARISON:  CT scan of April 14, 2016. MRI of April 15, 2016. FINDINGS: Brain: Mild chronic ischemic white matter disease is noted. Old right cerebellar infarction is noted. No midline shift is noted. Ventricular size is within normal limits. Old left posterior parietal infarction is noted. New cortical low density is noted in right frontal lobe concerning for infarction of indeterminate age. Vascular: No hyperdense vessel or unexpected calcification. Skull: Normal. Negative for fracture or focal lesion. Sinuses/Orbits: No acute finding. Other: None. IMPRESSION: New focal low density is seen in right frontal lobe concerning for infarction of indeterminate age. MRI may be performed for further evaluation. Electronically Signed   By: Marijo Conception, M.D.   On: 06/27/2017 17:03   Mr Brain Wo Contrast  Result Date: 06/27/2017 CLINICAL DATA:  66 y/o F; altered level of consciousness and abnormal CT of head. EXAM: MRI HEAD WITHOUT CONTRAST TECHNIQUE: Multiplanar, multiecho pulse sequences of the brain and surrounding structures were obtained without intravenous contrast. COMPARISON:  04/15/2016 MRI head.  06/27/2017 CT head. FINDINGS: Brain:  Moderate motion degradation of multiple sequences. New region of hypoattenuation on CT corresponds to a infarction in the right anterolateral frontal lobe measuring 3.0 x 2.9 x 3.2 cm (volume = 15 cm^3) with increased T2 FLAIR signal, mild local mass effect, and heterogeneous diffusion, likely subacute. Stable chronic small left  parietal lobe infarction, chronic microvascular ischemic changes of white matter, and parenchymal volume loss of the brain. No intracranial hemorrhage, herniation, or effacement of basilar cisterns. Vascular: Normal flow voids. Skull and upper cervical spine: Normal marrow signal. Sinuses/Orbits: Negative. Other: None. IMPRESSION: 1. Right anterolateral frontal lobe subacute infarction measuring 15 cc. No associated hemorrhage or significant mass effect. 2. Stable small chronic left parietal infarction, microvascular ischemic changes of white matter, and parenchymal volume loss of the brain. Electronically Signed   By: Kristine Garbe M.D.   On: 06/27/2017 22:49   Ct Abdomen Pelvis W Contrast  Result Date: 06/27/2017 CLINICAL DATA:  66 year old female on dialysis presenting with nausea, vomiting and diarrhea since Sunday. EXAM: CT ABDOMEN AND PELVIS WITH CONTRAST TECHNIQUE: Multidetector CT imaging of the abdomen and pelvis was performed using the standard protocol following bolus administration of intravenous contrast. CONTRAST:  184mL ISOVUE-300 IOPAMIDOL (ISOVUE-300) INJECTION 61% COMPARISON:  05/12/2013 FINDINGS: Lower chest: Stable cardiomegaly. Passive atelectasis at the lung bases. Small  to moderate-sized hiatal hernia. Hepatobiliary: No focal liver abnormality is seen. No gallstones, gallbladder wall thickening, or biliary dilatation. Pancreas: Unremarkable. No pancreatic ductal dilatation or surrounding inflammatory changes. Spleen: No splenomegaly. Stable 16 mm hypodensity at the splenic hilum likely to represent a small splenic cyst. 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 Normal right adrenal gland. Renovascular calcifications are noted at at the hila of both kidneys with perinephric fat stranding, chronic in appearance. No obstructive uropathy. No enhancing renal masses. The urinary bladder is decompressed.  Stomach/Bowel: The stomach is distended with fluid and may reflect gastroparesis. Normal small bowel rotation without mural thickening or obstruction. Small 17 mm submucosal lipoma in the cecum. Normal appendix. No acute abnormality of the colon. Vascular/Lymphatic: Moderate aortoiliac and branch vessel atherosclerosis. Reproductive: Simple 3.6 cm left ovarian cyst. The uterus and right adnexa are unremarkable. Other: No free air free fluid. Musculoskeletal: Degenerative disc disease L5-S1 with partially calcified broad-based disc bulge. No acute nor suspicious osseous abnormality. IMPRESSION: 1. Fluid-filled distention of the stomach associated with a small to moderate-sized hiatal hernia. Question gastroparesis. Presence of hiatal hernia may predispose the patient to reflux given the fluid distention of the stomach. 2. Stable splenic 16 mm hypodensity at the hilum likely to represent a small splenic cyst. 3. 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 4. 17 mm submucosal lipoma within the cecum. 5. Simple 3.6 cm left ovarian cyst. Given stability since 2014, findings likely represent a benign etiology. 6. Degenerative disc disease L5-S1. Electronically Signed   By: Ashley Royalty M.D.   On: 06/27/2017 14:54     Medications:    . cinacalcet  60 mg Oral Q breakfast  . gabapentin  100 mg Oral TID  . insulin aspart  0-5 Units Subcutaneous QHS  . insulin aspart  0-9 Units Subcutaneous TID WC  . metoCLOPramide (REGLAN) injection  5 mg Intravenous Q6H  . metoprolol succinate  100 mg Oral QPM  . pantoprazole (PROTONIX) IV  40 mg Intravenous Q12H  . sevelamer carbonate  2,400 mg Oral TID WC   hydrALAZINE, ipratropium-albuterol, labetalol, prochlorperazine  Assessment/ Plan:  66 y.o. 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  End-stage renal disease, on hemodialysis 2.  Severe hypertension 3.  Anemia of chronic kidney disease 4.  Secondary hyperparathyroidism 5.  Diabetes type 2 with complications of neuropathy and retinopathy and CKD 6.  Nausea, vomiting, diarrhea with hematemesis, likely gastroparesis 7.  Altered mental status and speech. MRI shows new Right frontal infarct  Under evaluation.  Plan: we will try to attempt dialysis later in the day today.  Hold cinacalcet and sevelamer for now as patient is n.p.o. Neurological evaluation is ongoing Patient is currently getting metoclopramide IV for symptoms of gastroparesis Blood pressure is being controlled with metoprolol orally and IV labetalol and hydralazine Patient is currently getting gabapentin 100 mg 3 times a day.  If myoclonic activity persists, consider holding gabapentin. Hold EPO due to severe HTN and recent ischemic event We will follow. Tried to call patient's emergency contact- Cell phone does note accept incoming calls; Home phone- No Answer   LOS: 1 Ledell Codrington 12/5/20188:52 AM  [address] [place], [place]

## 2017-06-28 NOTE — Progress Notes (Signed)
Patient has rested quietly throughout the night receiving PRN antihypertensives alternating labetalol and hydralazine  for SBP > 180. POC for HTN discussed with NP with no change to current orders. Patient to MRI during night d/t CT results and change in mentation. MRI results and patient's mentation as well as patient's apparent difficulty verbally expressing self discussed with NP no further orders received. Patient currently resting with eyes closed BP 172/86.

## 2017-06-29 ENCOUNTER — Inpatient Hospital Stay
Admit: 2017-06-29 | Discharge: 2017-06-29 | Disposition: A | Discharge status: Home / Self Care | Payer: Medicare HMO | Attending: Neurology | Admitting: Neurology

## 2017-06-29 ENCOUNTER — Other Ambulatory Visit: Payer: Self-pay

## 2017-06-29 ENCOUNTER — Inpatient Hospital Stay: Payer: Medicare HMO

## 2017-06-29 DIAGNOSIS — G43A0 Cyclical vomiting, not intractable: Secondary | ICD-10-CM

## 2017-06-29 DIAGNOSIS — I161 Hypertensive emergency: Secondary | ICD-10-CM

## 2017-06-29 LAB — BASIC METABOLIC PANEL
ANION GAP: 16 — AB (ref 5–15)
BUN: 68 mg/dL — AB (ref 6–20)
CHLORIDE: 93 mmol/L — AB (ref 101–111)
CO2: 26 mmol/L (ref 22–32)
Calcium: 9.1 mg/dL (ref 8.9–10.3)
Creatinine, Ser: 11.43 mg/dL — ABNORMAL HIGH (ref 0.44–1.00)
GFR calc Af Amer: 3 mL/min — ABNORMAL LOW (ref >60)
GFR calc non Af Amer: 3 mL/min — ABNORMAL LOW (ref >60)
GLUCOSE: 169 mg/dL — AB (ref 65–99)
POTASSIUM: 3.9 mmol/L (ref 3.5–5.1)
SODIUM: 135 mmol/L (ref 135–145)

## 2017-06-29 LAB — GLUCOSE, CAPILLARY
GLUCOSE-CAPILLARY: 174 mg/dL — AB (ref 65–99)
Glucose-Capillary: 211 mg/dL — ABNORMAL HIGH (ref 65–99)

## 2017-06-29 LAB — LIPID PANEL
Cholesterol: 133 mg/dL (ref 0–200)
HDL: 38 mg/dL — ABNORMAL LOW (ref >40)
LDL CALC: 67 mg/dL (ref 0–99)
Total CHOL/HDL Ratio: 3.5 RATIO
Triglycerides: 142 mg/dL (ref <150)
VLDL: 28 mg/dL (ref 0–40)

## 2017-06-29 LAB — HEPATITIS B SURFACE ANTIBODY, QUANTITATIVE: Hepatitis B-Post: 22.3 m[IU]/mL (ref >9.9)

## 2017-06-29 LAB — CBC
HCT: 34.8 % — ABNORMAL LOW (ref 35.0–47.0)
Hemoglobin: 11.3 g/dL — ABNORMAL LOW (ref 12.0–16.0)
MCH: 29.1 pg (ref 26.0–34.0)
MCHC: 32.4 g/dL (ref 32.0–36.0)
MCV: 89.6 fL (ref 80.0–100.0)
Platelets: 243 K/uL (ref 150–440)
RBC: 3.88 MIL/uL (ref 3.80–5.20)
RDW: 14.5 % (ref 11.5–14.5)
WBC: 10 K/uL (ref 3.6–11.0)

## 2017-06-29 LAB — HEPATITIS B SURFACE ANTIGEN: Hepatitis B Surface Ag: NEGATIVE

## 2017-06-29 LAB — HEMOGLOBIN A1C
HEMOGLOBIN A1C: 6.3 % — AB (ref 4.8–5.6)
Mean Plasma Glucose: 134.11 mg/dL

## 2017-06-29 MED ORDER — CLOPIDOGREL BISULFATE 75 MG PO TABS
75.0000 mg | ORAL_TABLET | Freq: Every day | ORAL | Status: DC
  Administered 2017-06-30 – 2017-07-01 (×2): 75 mg via ORAL
  Filled 2017-06-29 (×2): qty 1

## 2017-06-29 MED ORDER — NEPRO/CARBSTEADY PO LIQD
237.0000 mL | Freq: Two times a day (BID) | ORAL | Status: DC
  Administered 2017-06-29 – 2017-07-01 (×3): 237 mL via ORAL

## 2017-06-29 MED ORDER — IRBESARTAN 150 MG PO TABS
150.0000 mg | ORAL_TABLET | Freq: Every day | ORAL | Status: DC
  Administered 2017-07-01: 150 mg via ORAL
  Filled 2017-06-29: qty 1

## 2017-06-29 MED ORDER — GABAPENTIN 100 MG PO CAPS
100.0000 mg | ORAL_CAPSULE | Freq: Every day | ORAL | Status: DC
  Administered 2017-06-30 – 2017-07-01 (×2): 100 mg via ORAL
  Filled 2017-06-29 (×2): qty 1

## 2017-06-29 MED ORDER — PNEUMOCOCCAL VAC POLYVALENT 25 MCG/0.5ML IJ INJ
0.5000 mL | INJECTION | INTRAMUSCULAR | Status: AC
  Administered 2017-07-01: 0.5 mL via INTRAMUSCULAR
  Filled 2017-06-29: qty 0.5

## 2017-06-29 MED ORDER — LIDOCAINE-PRILOCAINE 2.5-2.5 % EX CREA
TOPICAL_CREAM | CUTANEOUS | Status: DC | PRN
  Filled 2017-06-29: qty 5

## 2017-06-29 MED ORDER — ATORVASTATIN CALCIUM 80 MG PO TABS
80.0000 mg | ORAL_TABLET | Freq: Every day | ORAL | Status: DC
  Administered 2017-06-30: 80 mg via ORAL
  Filled 2017-06-29: qty 4
  Filled 2017-06-29 (×2): qty 1

## 2017-06-29 MED ORDER — GABAPENTIN 100 MG PO CAPS: 100.0000 mg | ORAL_CAPSULE | Freq: Every day | ORAL | Status: DC

## 2017-06-29 NOTE — Progress Notes (Signed)
*  PRELIMINARY RESULTS* Echocardiogram 2D Echocardiogram has been performed.  Cynthia Dean 06/29/2017, 10:52 AM

## 2017-06-29 NOTE — Progress Notes (Signed)
[facility] NAME: Cynthia Dean    MR#:  [id number]  DATE OF BIRTH:  [birth year]  SUBJECTIVE:   Pt. Here due to intractable N/V and also noted to have coffee ground emesis. 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   No further N/V or abdominal pain presently.   Blood pressure still remains elevated, patient has agreed to have hemodialysis today now. Daughter is at bedside.    REVIEW OF SYSTEMS:    Review of Systems  Constitutional: Negative for chills and fever.  HENT: Negative for congestion and tinnitus.   Eyes: Negative for blurred vision and double vision.  Respiratory: Negative for cough, shortness of breath and wheezing.   Cardiovascular: Negative for chest pain, orthopnea and PND.  Gastrointestinal: Negative for abdominal pain, diarrhea, nausea and vomiting.  Genitourinary: Negative for dysuria and hematuria.  Neurological: Negative for dizziness, sensory change and focal weakness.  All other systems reviewed and are negative.   Nutrition: Renal Diet Tolerating Diet: Yes Tolerating PT: Await Eval.   DRUG ALLERGIES:   Allergies  Allergen Reactions  . Tape Itching    Skin Dermatitis/itching (tape adhesive)    VITALS:  Blood pressure (!) 185/69, pulse 73, temperature 98.5 F (36.9 C), temperature source Oral, resp. rate 18, height 5\' 6"  (1.676 m), weight 121.6 kg (268 lb 1.3 oz), SpO2 100 %.  PHYSICAL EXAMINATION:   Physical Exam  GENERAL:  66 y.o.-year-old patient lying in bed in no acute distress.  EYES: Pupils equal, round, reactive to light and accommodation. No scleral icterus. Extraocular muscles intact.  HEENT: Head atraumatic, normocephalic. Oropharynx and nasopharynx clear.  NECK:  Supple, no jugular venous distention. No thyroid enlargement, no tenderness.  LUNGS: Normal breath sounds bilaterally, no wheezing, rales, rhonchi. No use of accessory muscles of respiration.   CARDIOVASCULAR: S1, S2 normal. No murmurs, rubs, or gallops.  ABDOMEN: Soft, nontender, nondistended. Bowel sounds present. No organomegaly or mass.  EXTREMITIES: No cyanosis, clubbing or edema b/l.    NEUROLOGIC: Cranial nerves II through XII are intact. No focal Motor or sensory deficits b/l. Globally weak  PSYCHIATRIC: The patient is alert and oriented x 3.  SKIN: No obvious rash, lesion, or ulcer.   Left Upper Ext. AV fistula with good bruit and thrill.    LABORATORY PANEL:   CBC Recent Labs  Lab 06/29/17 0351  WBC 10.0  HGB 11.3*  HCT 34.8*  PLT 243   ------------------------------------------------------------------------------------------------------------------  Chemistries  Recent Labs  Lab 06/27/17 1307 06/28/17 2228 06/29/17 0351  NA 142 137 135  K 3.7 3.7 3.9  CL 96* 94* 93*  CO2 27 24 26   GLUCOSE 127* 139* 169*  BUN 39* 65* 68*  CREATININE 7.77* 11.06* 11.43*  CALCIUM 10.1 9.1 9.1  MG  --  1.9  --   AST 26  --   --   ALT 17  --   --   ALKPHOS 91  --   --   BILITOT 0.7  --   --    ------------------------------------------------------------------------------------------------------------------  Cardiac Enzymes Recent Labs  Lab 06/27/17 1307  TROPONINI 0.07*   ------------------------------------------------------------------------------------------------------------------  RADIOLOGY:  Ct Head Wo Contrast  Result Date: 06/27/2017 CLINICAL DATA:  Altered level of consciousness. EXAM: CT HEAD WITHOUT CONTRAST TECHNIQUE: Contiguous axial images were obtained from the base of the skull through the vertex without intravenous contrast. COMPARISON:  CT scan of April 14, 2016. MRI of April 15, 2016. FINDINGS: Brain: Mild chronic ischemic white matter disease is noted. Old right cerebellar infarction is noted. No midline shift is noted. Ventricular size is within normal limits. Old left posterior parietal infarction is noted. New cortical low  density is noted in right frontal lobe concerning for infarction of indeterminate age. Vascular: No hyperdense vessel or unexpected calcification. Skull: Normal. Negative for fracture or focal lesion. Sinuses/Orbits: No acute finding. Other: None. IMPRESSION: New focal low density is seen in right frontal lobe concerning for infarction of indeterminate age. MRI may be performed for further evaluation. Electronically Signed   By: Marijo Conception, M.D.   On: 06/27/2017 17:03   Mr Brain Wo Contrast  Result Date: 06/27/2017 CLINICAL DATA:  66 y/o F; altered level of consciousness and abnormal CT of head. EXAM: MRI HEAD WITHOUT CONTRAST TECHNIQUE: Multiplanar, multiecho pulse sequences of the brain and surrounding structures were obtained without intravenous contrast. COMPARISON:  04/15/2016 MRI head.  06/27/2017 CT head. FINDINGS: Brain:  Moderate motion degradation of multiple sequences. New region of hypoattenuation on CT corresponds to a infarction in the right anterolateral frontal lobe measuring 3.0 x 2.9 x 3.2 cm (volume = 15 cm^3) with increased T2 FLAIR signal, mild local mass effect, and heterogeneous diffusion, likely subacute. Stable chronic small left parietal lobe infarction, chronic microvascular ischemic changes of white matter, and parenchymal volume loss of the brain. No intracranial hemorrhage, herniation, or effacement of basilar cisterns. Vascular: Normal flow voids. Skull and upper cervical spine: Normal marrow signal. Sinuses/Orbits: Negative. Other: None. IMPRESSION: 1. Right anterolateral frontal lobe subacute infarction measuring 15 cc. No associated hemorrhage or significant mass effect. 2. Stable small chronic left parietal infarction, microvascular ischemic changes of white matter, and parenchymal volume loss of the brain. Electronically Signed   By: Kristine Garbe M.D.   On: 06/27/2017 22:49   Ct Abdomen Pelvis W Contrast  Result Date: 06/27/2017 CLINICAL DATA:  66 year old  female on dialysis presenting with nausea, vomiting and diarrhea since Sunday. EXAM: CT ABDOMEN AND PELVIS WITH CONTRAST TECHNIQUE: Multidetector CT imaging of the abdomen and pelvis was performed using the standard protocol following bolus administration of intravenous contrast. CONTRAST:  166mL ISOVUE-300 IOPAMIDOL (ISOVUE-300) INJECTION 61% COMPARISON:  05/12/2013 FINDINGS: Lower chest: Stable cardiomegaly. Passive atelectasis at the lung bases. Small to moderate-sized hiatal hernia. Hepatobiliary: No focal liver abnormality is seen. No gallstones, gallbladder wall thickening, or biliary dilatation. Pancreas: Unremarkable. No pancreatic ductal dilatation or surrounding inflammatory changes. Spleen: No splenomegaly. Stable 16 mm hypodensity at the splenic hilum likely to represent a small splenic cyst. 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 Normal right adrenal gland. Renovascular calcifications are noted at at the hila of both kidneys with perinephric fat stranding, chronic in appearance. No obstructive uropathy. No enhancing renal masses. The urinary bladder is decompressed. Stomach/Bowel: The stomach is distended with fluid and may reflect gastroparesis. Normal small bowel rotation without mural thickening or obstruction. Small 17 mm submucosal lipoma in the cecum. Normal appendix. No acute abnormality of the colon. Vascular/Lymphatic: Moderate aortoiliac and branch vessel atherosclerosis. Reproductive: Simple 3.6 cm left ovarian cyst. The uterus and right adnexa are unremarkable. Other: No free air free fluid. Musculoskeletal: Degenerative disc disease L5-S1 with partially calcified broad-based disc bulge. No acute nor suspicious osseous abnormality. IMPRESSION: 1. Fluid-filled distention of the stomach associated with a small to moderate-sized hiatal hernia. Question gastroparesis. Presence of hiatal hernia may predispose the  patient to reflux  given the fluid distention of the stomach. 2. Stable splenic 16 mm hypodensity at the hilum likely to represent a small splenic cyst. 3. 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 4. 17 mm submucosal lipoma within the cecum. 5. Simple 3.6 cm left ovarian cyst. Given stability since 2014, findings likely represent a benign etiology. 6. Degenerative disc disease L5-S1. Electronically Signed   By: Ashley Royalty M.D.   On: 06/27/2017 14:54   US Carotid Bilateral  Result Date: 06/29/2017 CLINICAL DATA:  Stroke. CHF, syncope, confusion, hypertension, hyperlipidemia, diabetes. EXAM: BILATERAL CAROTID DUPLEX ULTRASOUND TECHNIQUE: Pearline Cables scale imaging, color Doppler and duplex ultrasound was performed of bilateral carotid and vertebral arteries in the neck. COMPARISON:  04/14/2016 TECHNIQUE: Quantification of carotid stenosis is based on velocity parameters that correlate the residual internal carotid diameter with NASCET-based stenosis levels, using the diameter of the distal internal carotid lumen as the denominator for stenosis measurement. The following velocity measurements were obtained: PEAK SYSTOLIC/END DIASTOLIC RIGHT ICA:                     95/19cm/sec CCA:                     61/60VP/XTG SYSTOLIC ICA/CCA RATIO:  1.1 DIASTOLIC ICA/CCA RATIO: 1.7 ECA:                     95cm/sec LEFT ICA:                     97/16cm/sec CCA:                     62/69SW/NIO SYSTOLIC ICA/CCA RATIO:  1.1 DIASTOLIC ICA/CCA RATIO: 1.4 ECA:                     139cm/sec FINDINGS: RIGHT CAROTID ARTERY: Eccentric partially calcified plaque in the distal common carotid artery, bulb, and proximal ICA. No high-grade stenosis. Normal waveforms and color Doppler signal. RIGHT VERTEBRAL ARTERY:  Normal flow direction and waveform. LEFT CAROTID ARTERY: Eccentric partially calcified plaque in the bulb and proximal ICA. No high-grade stenosis. Normal waveforms and color Doppler signal. LEFT VERTEBRAL ARTERY: Normal flow  direction and waveform. Incidental note made of a mixed solid/cystic 2.5cm left thyroid lesion. IMPRESSION: 1. Bilateral carotid bifurcation and proximal ICA plaque resulting in less than 50% diameter stenosis. 2.  Antegrade bilateral vertebral arterial flow. 3. 2.5 cm left thyroid lesion. Consider further evaluation with thyroid ultrasound. If patient is clinically hyperthyroid, consider nuclear medicine thyroid uptake and scan. Electronically Signed   By: Lucrezia Europe M.D.   On: 06/29/2017 10:26     ASSESSMENT AND PLAN:   66 year old female with past medical history of end-stage renal disease on hemodialysis, essential hypertension, hyperlipidemia, diabetes, COPD who presented to the hospital due to abdominal pain nausea vomiting and also accelerated hypertension.  1. Acute CVA -this was noted on a MRI of the brain. This is likely acute ischemic CVA from uncontrolled hypertension. -Continue aspirin, Plavix, statin.  -appreciate neurology and cont. Current care.  For now.  - await PT, OT eval.    2. Accelerated hypertension- blood pressure is still somewhat uncontrolled.   -continue Toprol, IV labetalol as needed. Patient to get hemodialysis today and if blood pressure not improving will resume Diovan.   3. Intractable nausea and vomiting-secondary to hiatal hernia/gastroparesis is seen on the CT scan of the abdomen and pelvis on admission. -  much improved with IV fluids and antiemetics. Seen by gastroenterology and no plans for acute intervention for now.if patient continues to have persistent nausea vomiting didn't recommend NG tube and supportive care. If patient has further diarrhea didn't recommend doing workup for possible C. Difficile and stool culture. Clinically patient is not having any other symptoms presently.  4. End-stage renal disease on hemodialysis-nephrology has been consulted. Patient gets dialysis on Monday Wednesday Friday.  -patient refused hemodialysis yesterday but will get  dialysis today. Continue further care as per nephrology.  5. Diabetes type 2 without complication-continue sliding scale insulin for now. BS stable.   6. Diabetic neuropathy-continue gabapentin.  7. GERD-continue Protonix.  Pt. Is on a transfer list to [facility] and awaiting a bed.  Family is aware and will cont. Current care for now.    All the records are reviewed and case discussed with Care Management/Social Worker. Management plans discussed with the patient, family and they are in agreement.  CODE STATUS: Full  DVT Prophylaxis: Heparin subcutaneous  TOTAL TIME TAKING CARE OF THIS PATIENT: 30 minutes.   POSSIBLE D/C IN 1-2 DAYS, DEPENDING ON CLINICAL CONDITION.   Henreitta Leber M.D on 06/29/2017 at 2:08 PM  Between 7am to 6pm - Pager - [phone]  After 6pm go to [email or web address] - Proofreader  [facility]  Office  [phone]  CC: Primary care physician; Lowella Bandy, MD

## 2017-06-29 NOTE — Progress Notes (Signed)
Pt transported in stable condition. All belongings with patient. 2-C nurse at bedside.

## 2017-06-29 NOTE — Progress Notes (Signed)
[facility], [place] 06/29/17  Subjective:   Patient is known to our practice from outpatient dialysis.  She presented to the emergency room with nausea, vomiting and diarrhea.  According to admission notes, she has been vomiting frequently and not able to keep anything p.o.  Prior to admission, she vomited 5 times.  Per nursing reports she had dark vomitus in the hospital one time.  Patient is not able to provide any meaningful information as she is oriented only to self.  She has some expressive aphasia. Presenting blood pressure to the emergency room was 230/106.    HEMODIALYSIS FLOWSHEET:  Blood Flow Rate (mL/min): 300 mL/min Arterial Pressure (mmHg): -200 mmHg Venous Pressure (mmHg): 180 mmHg Transmembrane Pressure (mmHg): 50 mmHg Ultrafiltration Rate (mL/min): 170 mL/min Dialysate Flow Rate (mL/min): 600 ml/min Conductivity: Machine : 14.2 Conductivity: Machine : 14.2 Dialysis Fluid Bolus: Normal Saline Bolus Amount (mL): 250 mL    Objective:  Vital signs in last 24 hours:  Temp:  [97.7 F (36.5 C)-98.7 F (37.1 C)] 98.5 F (36.9 C) (12/06 1110) Pulse Rate:  [72-91] 73 (12/06 1110) Resp:  [13-23] 18 (12/06 1110) BP: (149-190)/(55-79) 185/69 (12/06 1110) SpO2:  [90 %-100 %] 100 % (12/06 1110)  Weight change:  Filed Weights   06/27/17 1203 06/27/17 1800  Weight: 104.3 kg (230 lb) 121.6 kg (268 lb 1.3 oz)    Intake/Output:    Intake/Output Summary (Last 24 hours) at 06/29/2017 1321 Last data filed at 06/28/2017 1500 Gross per 24 hour  Intake 120 ml  Output -  Net 120 ml     Physical Exam: General:  Laying in the bed, no acute distress  HEENT  anicteric, moist oral mucous membrane  Neck  supple  Pulm/lungs  lungs are clear to auscultation,   CVS/Heart  tachycardic, no rub or gallop  Abdomen:   Soft, nondistended  Extremities:  No peripheral edema  Neurologic:  Alert and oriented, able to follow simple commands, much improved  compared to yesterday  Skin:  No acute rashes  Access:  Left upper arm AV graft, good bruit       Basic Metabolic Panel:  Recent Labs  Lab 06/27/17 1307 06/28/17 1041 06/28/17 2228 06/29/17 0351  NA 142  --  137 135  K 3.7  --  3.7 3.9  CL 96*  --  94* 93*  CO2 27  --  24 26  GLUCOSE 127*  --  139* 169*  BUN 39*  --  65* 68*  CREATININE 7.77*  --  11.06* 11.43*  CALCIUM 10.1  --  9.1 9.1  MG  --   --  1.9  --   PHOS  --  7.0* 7.4*  --      CBC: Recent Labs  Lab 06/27/17 1307 06/29/17 0351  WBC 12.7* 10.0  NEUTROABS 11.2*  --   HGB 11.7* 11.3*  HCT 35.7 34.8*  MCV 88.6 89.6  PLT 254 243      Lab Results  Component Value Date   HEPBSAG Negative 06/28/2017      Microbiology:  Recent Results (from the past 240 hour(s))  MRSA PCR Screening     Status: None   Collection Time: 06/27/17  6:10 PM  Result Value Ref Range Status   MRSA by PCR NEGATIVE NEGATIVE Final    Comment:        The GeneXpert MRSA Assay (FDA approved for NASAL specimens only), is one component of a comprehensive MRSA colonization surveillance  program. It is not intended to diagnose MRSA infection nor to guide or monitor treatment for MRSA infections.     Coagulation Studies: No results for input(s): LABPROT, INR in the last 72 hours.  Urinalysis: Recent Labs    06/27/17 1310  COLORURINE YELLOW*  LABSPEC 1.006  PHURINE 9.0*  GLUCOSEU >=500*  HGBUR NEGATIVE  BILIRUBINUR NEGATIVE  KETONESUR NEGATIVE  PROTEINUR >=300*  NITRITE NEGATIVE  LEUKOCYTESUR NEGATIVE      Imaging: Ct Head Wo Contrast  Result Date: 06/27/2017 CLINICAL DATA:  Altered level of consciousness. EXAM: CT HEAD WITHOUT CONTRAST TECHNIQUE: Contiguous axial images were obtained from the base of the skull through the vertex without intravenous contrast. COMPARISON:  CT scan of April 14, 2016. MRI of April 15, 2016. FINDINGS: Brain: Mild chronic ischemic white matter disease is noted. Old right  cerebellar infarction is noted. No midline shift is noted. Ventricular size is within normal limits. Old left posterior parietal infarction is noted. New cortical low density is noted in right frontal lobe concerning for infarction of indeterminate age. Vascular: No hyperdense vessel or unexpected calcification. Skull: Normal. Negative for fracture or focal lesion. Sinuses/Orbits: No acute finding. Other: None. IMPRESSION: New focal low density is seen in right frontal lobe concerning for infarction of indeterminate age. MRI may be performed for further evaluation. Electronically Signed   By: Marijo Conception, M.D.   On: 06/27/2017 17:03   Mr Brain Wo Contrast  Result Date: 06/27/2017 CLINICAL DATA:  66 y/o F; altered level of consciousness and abnormal CT of head. EXAM: MRI HEAD WITHOUT CONTRAST TECHNIQUE: Multiplanar, multiecho pulse sequences of the brain and surrounding structures were obtained without intravenous contrast. COMPARISON:  04/15/2016 MRI head.  06/27/2017 CT head. FINDINGS: Brain:  Moderate motion degradation of multiple sequences. New region of hypoattenuation on CT corresponds to a infarction in the right anterolateral frontal lobe measuring 3.0 x 2.9 x 3.2 cm (volume = 15 cm^3) with increased T2 FLAIR signal, mild local mass effect, and heterogeneous diffusion, likely subacute. Stable chronic small left parietal lobe infarction, chronic microvascular ischemic changes of white matter, and parenchymal volume loss of the brain. No intracranial hemorrhage, herniation, or effacement of basilar cisterns. Vascular: Normal flow voids. Skull and upper cervical spine: Normal marrow signal. Sinuses/Orbits: Negative. Other: None. IMPRESSION: 1. Right anterolateral frontal lobe subacute infarction measuring 15 cc. No associated hemorrhage or significant mass effect. 2. Stable small chronic left parietal infarction, microvascular ischemic changes of white matter, and parenchymal volume loss of the brain.  Electronically Signed   By: Kristine Garbe M.D.   On: 06/27/2017 22:49   Ct Abdomen Pelvis W Contrast  Result Date: 06/27/2017 CLINICAL DATA:  66 year old female on dialysis presenting with nausea, vomiting and diarrhea since Sunday. EXAM: CT ABDOMEN AND PELVIS WITH CONTRAST TECHNIQUE: Multidetector CT imaging of the abdomen and pelvis was performed using the standard protocol following bolus administration of intravenous contrast. CONTRAST:  165mL ISOVUE-300 IOPAMIDOL (ISOVUE-300) INJECTION 61% COMPARISON:  05/12/2013 FINDINGS: Lower chest: Stable cardiomegaly. Passive atelectasis at the lung bases. Small to moderate-sized hiatal hernia. Hepatobiliary: No focal liver abnormality is seen. No gallstones, gallbladder wall thickening, or biliary dilatation. Pancreas: Unremarkable. No pancreatic ductal dilatation or surrounding inflammatory changes. Spleen: No splenomegaly. Stable 16 mm hypodensity at the splenic hilum likely to represent a small splenic cyst. 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 Normal right adrenal gland. Renovascular calcifications are noted at at the hila of  both kidneys with perinephric fat stranding, chronic in appearance. No obstructive uropathy. No enhancing renal masses. The urinary bladder is decompressed. Stomach/Bowel: The stomach is distended with fluid and may reflect gastroparesis. Normal small bowel rotation without mural thickening or obstruction. Small 17 mm submucosal lipoma in the cecum. Normal appendix. No acute abnormality of the colon. Vascular/Lymphatic: Moderate aortoiliac and branch vessel atherosclerosis. Reproductive: Simple 3.6 cm left ovarian cyst. The uterus and right adnexa are unremarkable. Other: No free air free fluid. Musculoskeletal: Degenerative disc disease L5-S1 with partially calcified broad-based disc bulge. No acute nor suspicious osseous abnormality. IMPRESSION:  1. Fluid-filled distention of the stomach associated with a small to moderate-sized hiatal hernia. Question gastroparesis. Presence of hiatal hernia may predispose the patient to reflux given the fluid distention of the stomach. 2. Stable splenic 16 mm hypodensity at the hilum likely to represent a small splenic cyst. 3. 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 4. 17 mm submucosal lipoma within the cecum. 5. Simple 3.6 cm left ovarian cyst. Given stability since 2014, findings likely represent a benign etiology. 6. Degenerative disc disease L5-S1. Electronically Signed   By: Ashley Royalty M.D.   On: 06/27/2017 14:54   US Carotid Bilateral  Result Date: 06/29/2017 CLINICAL DATA:  Stroke. CHF, syncope, confusion, hypertension, hyperlipidemia, diabetes. EXAM: BILATERAL CAROTID DUPLEX ULTRASOUND TECHNIQUE: Pearline Cables scale imaging, color Doppler and duplex ultrasound was performed of bilateral carotid and vertebral arteries in the neck. COMPARISON:  04/14/2016 TECHNIQUE: Quantification of carotid stenosis is based on velocity parameters that correlate the residual internal carotid diameter with NASCET-based stenosis levels, using the diameter of the distal internal carotid lumen as the denominator for stenosis measurement. The following velocity measurements were obtained: PEAK SYSTOLIC/END DIASTOLIC RIGHT ICA:                     95/19cm/sec CCA:                     24/23NT/IRW SYSTOLIC ICA/CCA RATIO:  1.1 DIASTOLIC ICA/CCA RATIO: 1.7 ECA:                     95cm/sec LEFT ICA:                     97/16cm/sec CCA:                     43/15QM/GQQ SYSTOLIC ICA/CCA RATIO:  1.1 DIASTOLIC ICA/CCA RATIO: 1.4 ECA:                     139cm/sec FINDINGS: RIGHT CAROTID ARTERY: Eccentric partially calcified plaque in the distal common carotid artery, bulb, and proximal ICA. No high-grade stenosis. Normal waveforms and color Doppler signal. RIGHT VERTEBRAL ARTERY:  Normal flow direction and waveform. LEFT CAROTID ARTERY:  Eccentric partially calcified plaque in the bulb and proximal ICA. No high-grade stenosis. Normal waveforms and color Doppler signal. LEFT VERTEBRAL ARTERY: Normal flow direction and waveform. Incidental note made of a mixed solid/cystic 2.5cm left thyroid lesion. IMPRESSION: 1. Bilateral carotid bifurcation and proximal ICA plaque resulting in less than 50% diameter stenosis. 2.  Antegrade bilateral vertebral arterial flow. 3. 2.5 cm left thyroid lesion. Consider further evaluation with thyroid ultrasound. If patient is clinically hyperthyroid, consider nuclear medicine thyroid uptake and scan. Electronically Signed   By: Lucrezia Europe M.D.   On: 06/29/2017 10:26     Medications:    . aspirin EC  81 mg Oral Daily  . atorvastatin  80 mg Oral q1800  . chlorhexidine  15 mL Mouth Rinse BID  . clopidogrel  75 mg Oral Daily  . gabapentin  100 mg Oral Daily  . heparin injection (subcutaneous)  5,000 Units Subcutaneous Q8H  . insulin aspart  0-5 Units Subcutaneous QHS  . insulin aspart  0-9 Units Subcutaneous TID WC  . mouth rinse  15 mL Mouth Rinse q12n4p  . metoCLOPramide (REGLAN) injection  5 mg Intravenous Q8H  . metoprolol succinate  100 mg Oral QPM  . pantoprazole (PROTONIX) IV  40 mg Intravenous Q12H  . [START ON 06/30/2017] pneumococcal 23 valent vaccine  0.5 mL Intramuscular Tomorrow-1000   ipratropium-albuterol, labetalol, prochlorperazine  Assessment/ Plan:  66 y.o. 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 [facility]/ 240 MIN/ 124.5 KG  1.  End-stage renal disease, on hemodialysis 2.  Severe hypertension 3.  Anemia of chronic kidney  disease 4.  Secondary hyperparathyroidism 5.  Diabetes type 2 with complications of neuropathy and retinopathy and CKD 6.  Nausea, vomiting, diarrhea with hematemesis, likely gastroparesis 7.  Altered mental status and speech. MRI shows new Right frontal infarct  Under evaluation.  Plan: Patient is seen during dialysis.  Tolerating well. Hemoglobin is 11.3, acceptable Phosphorus is high at 7.4 We will resume binders once able to eat normal diet Next hemodialysis potentially tomorrow as it is her regular day.   LOS: 2 Miguel Christiana 12/6/20181:21 PM  [facility] [place], [place]

## 2017-06-29 NOTE — Progress Notes (Signed)
Patient for abdominal pain and emesis. Patients CrCl 6.7 ml/min and is gabapentin 100 mg tid.  Spoke to NP about gabapentin dose in patient w/ ESRD. NP agrees with plan to readjust dose. Will readjust dose to gabapentin 100 mg daily considering CrCl < 7.5 ml/min.  Tobie Lords, PharmD, BCPS Clinical Pharmacist 06/29/2017

## 2017-06-29 NOTE — Consult Note (Signed)
Cynthia Antigua, MD [address], [place], [place], [place], [address] [address] [place], [place], [place], [address] Phone: [phone]  Fax: [phone]  Consultation  Referring Provider:     Dr. Verdell Carmine Primary Care Physician:  Lowella Bandy, MD Primary Gastroenterologist:  Virgel Manifold, MD        Reason for Consultation:     Nausea/Vomiting/Diarrhea  Date of Admission:  06/27/2017 Date of Consultation:  06/29/2017         HPI:   Cynthia Dean is a 66 y.o. female with ESRD who presented with nausea vomiting for a day and a half prior to admission and was found to have blood pressure of 230/106.  She was noted by family to be lethargic and have slurred speech.  During my interview no family is present at bedside.  Patient is alert, is answering questions but repeats herself and seems slightly confused.  When asked about her nausea vomiting and diarrhea, she is unable to complete her thought and tell me when exactly it started.  On admission patient was found to have blood pressure markedly elevated.  MRI showed right subacute anterolateral frontal lobe infarct.  She was evaluated by neurology who thought this to be a consequence of the markedly elevated blood pressure and hypertensive encephalopathy.  Patient is on aspirin Plavix and statin at home.  Past Medical History:  Diagnosis Date  . Chronic kidney disease   . COPD (chronic obstructive pulmonary disease) ([place])   . Coronary artery disease   . Diabetes mellitus without complication ([place])   . Heart murmur   . Hyperlipidemia   . Hypertension     Past Surgical History:  Procedure Laterality Date  . A/V FISTULAGRAM Left 12/20/2016   Procedure: A/V Fistulagram;  Surgeon: Katha Cabal, MD;  Location: [facility];  Service: Cardiovascular;  Laterality: Left;  . A/V SHUNT INTERVENTION N/A 12/20/2016   Procedure: A/V Shunt Intervention;  Surgeon: Katha Cabal, MD;  Location:  [facility];  Service: Cardiovascular;  Laterality: N/A;  . BREAST BIOPSY Bilateral 07/19/00   neg  . PERIPHERAL VASCULAR CATHETERIZATION Left 02/02/2015   Procedure: A/V Shuntogram/Fistulagram;  Surgeon: Algernon Huxley, MD;  Location: [facility];  Service: Cardiovascular;  Laterality: Left;  . PERIPHERAL VASCULAR CATHETERIZATION Left 02/02/2015   Procedure: A/V Shunt Intervention;  Surgeon: Algernon Huxley, MD;  Location: [facility];  Service: Cardiovascular;  Laterality: Left;  . PERIPHERAL VASCULAR CATHETERIZATION Left 03/09/2015   Procedure: A/V Shuntogram/Fistulagram;  Surgeon: Algernon Huxley, MD;  Location: [facility];  Service: Cardiovascular;  Laterality: Left;  . PERIPHERAL VASCULAR CATHETERIZATION N/A 03/09/2015   Procedure: A/V Shunt Intervention;  Surgeon: Algernon Huxley, MD;  Location: [facility];  Service: Cardiovascular;  Laterality: N/A;  . PERIPHERAL VASCULAR CATHETERIZATION Left 04/17/2015   Procedure: Upper Extremity Angiography;  Surgeon: Algernon Huxley, MD;  Location: [facility];  Service: Cardiovascular;  Laterality: Left;  . PERIPHERAL VASCULAR CATHETERIZATION  04/17/2015   Procedure: Upper Extremity Intervention;  Surgeon: Algernon Huxley, MD;  Location: [facility];  Service: Cardiovascular;;  . PERIPHERAL VASCULAR CATHETERIZATION N/A 08/10/2015   Procedure: A/V Shuntogram/Fistulagram;  Surgeon: Algernon Huxley, MD;  Location: [facility];  Service: Cardiovascular;  Laterality: N/A;  . PERIPHERAL VASCULAR CATHETERIZATION N/A 08/10/2015   Procedure: A/V Shunt Intervention;  Surgeon: Algernon Huxley, MD;  Location: [facility];  Service: Cardiovascular;  Laterality: N/A;  . PERIPHERAL VASCULAR CATHETERIZATION Left 04/11/2016   Procedure: A/V Shuntogram/Fistulagram;  Surgeon: Algernon Huxley, MD;  Location: [facility];  Service: Cardiovascular;  Laterality: Left;    Prior to Admission medications   Medication Sig  Start Date End Date Taking? Authorizing Provider  aspirin 81 MG tablet Take 81 mg by mouth daily.   Yes [provider]  atorvastatin (LIPITOR) 80 MG tablet Take 80 mg by mouth every evening.    Yes [provider]  cinacalcet (SENSIPAR) 60 MG tablet Take 60 mg by mouth daily.   Yes [provider]  clopidogrel (PLAVIX) 75 MG tablet TAKE ONE TABLET BY MOUTH ONCE DAILY 06/14/16  Yes Stegmayer, Joelene Millin A, PA-C  gabapentin (NEURONTIN) 100 MG capsule Take 100 mg by mouth 3 (three) times daily.   Yes [provider]  insulin NPH-regular Human (NOVOLIN 70/30) (70-30) 100 UNIT/ML injection Inject 40 Units into the skin 2 (two) times daily with a meal. Except dialysis days. Do not take on Tuesdays, Thursdays, and Saturdays.   Yes [provider]  irbesartan (AVAPRO) 150 MG tablet Take 1 tablet by mouth daily. 04/20/17  Yes [provider]  metoprolol succinate (TOPROL-XL) 100 MG 24 hr tablet Take 100 mg by mouth every evening. Take with or immediately following a meal.    Yes [provider]  omeprazole (PRILOSEC) 20 MG capsule Take 20 mg by mouth 2 (two) times daily.    Yes [provider]  pregabalin (LYRICA) 50 MG capsule Take 50 mg by mouth 2 (two) times daily.   Yes [provider]  sevelamer carbonate (RENVELA) 800 MG tablet Take 2,400 mg by mouth 3 (three) times daily with meals.    Yes [provider]  valsartan (DIOVAN) 160 MG tablet Take 160 mg by mouth every evening.    Yes [provider]  albuterol-ipratropium (COMBIVENT) 18-103 MCG/ACT inhaler Inhale 2 puffs into the lungs 2 (two) times daily.    [provider]  ipratropium-albuterol (DUONEB) 0.5-2.5 (3) MG/3ML SOLN Take 3 mLs by nebulization every 6 (six) hours as needed (shortness of breath/ wheezing).     [provider]  lidocaine-prilocaine (EMLA) cream Apply 1 application topically as needed (port access).     [provider]    Family History  Problem Relation Age of Onset  . Breast cancer Father 33  . Stroke Mother   . Heart attack Mother   . Heart Problems Sister   . Breast cancer Cousin 75       1 st cousin. Maternal      Social History   Tobacco Use  . Smoking status: Former Smoker    Packs/day: 0.00    Types: Cigarettes  . Smokeless tobacco: Never Used  Substance Use Topics  . Alcohol use: No  . Drug use: No    Allergies as of 06/27/2017 - Review Complete 06/27/2017  Allergen Reaction Noted  . Tape Itching 09/16/2016    Review of Systems:    All systems reviewed and negative except where noted in HPI.   Physical Exam:  Vital signs in last 24 hours: Vitals:   06/29/17 0626 06/29/17 0807 06/29/17 0819 06/29/17 1110  BP: (!) 174/70 (!) 187/75 (!) 188/72 (!) 185/69  Pulse: 72 73 72 73  Resp: (!) 23   18  Temp:  97.7 F (36.5 C)  98.5 F (36.9 C)  TempSrc:  Oral  Oral  SpO2: 98% 99%  100%  Weight:      Height:       Last BM Date: (pt unsure, confused) General:   Pleasant, cooperative in NAD Head:  Normocephalic and atraumatic. Eyes:   No icterus.   Conjunctiva pink. PERRLA. Ears:  Normal auditory acuity. Neck:  Supple; no masses or thyroidomegaly Lungs: Respirations even and unlabored. Lungs clear to auscultation bilaterally.   No wheezes, crackles, or rhonchi.  Heart:  Regular rate and rhythm;  Without murmur, clicks, rubs or gallops Abdomen:  Soft, nondistended, nontender. Normal bowel sounds. No appreciable masses or hepatomegaly.  No rebound or guarding.  Neurologic:  Alert and oriented x3;  grossly normal neurologically. Skin:  Intact without significant lesions or rashes. Cervical Nodes:  No significant cervical adenopathy. Psych:  Alert and cooperative. Normal affect.  LAB RESULTS: Recent Labs    06/27/17 1307 06/29/17 0351  WBC 12.7* 10.0  HGB 11.7* 11.3*  HCT 35.7 34.8*  PLT 254 243   BMET Recent Labs    06/27/17 1307 06/28/17 2228  06/29/17 0351  NA 142 137 135  K 3.7 3.7 3.9  CL 96* 94* 93*  CO2 27 24 26   GLUCOSE 127* 139* 169*  BUN 39* 65* 68*  CREATININE 7.77* 11.06* 11.43*  CALCIUM 10.1 9.1 9.1   LFT Recent Labs    06/27/17 1307  PROT 8.4*  ALBUMIN 4.1  AST 26  ALT 17  ALKPHOS 91  BILITOT 0.7   PT/INR No results for input(s): LABPROT, INR in the last 72 hours.  STUDIES: Ct Head Wo Contrast  Result Date: 06/27/2017 CLINICAL DATA:  Altered level of consciousness. EXAM: CT HEAD WITHOUT CONTRAST TECHNIQUE: Contiguous axial images were obtained from the base of the skull through the vertex without intravenous contrast. COMPARISON:  CT scan of April 14, 2016. MRI of April 15, 2016. FINDINGS: Brain: Mild chronic ischemic white matter disease is noted. Old right cerebellar infarction is noted. No midline shift is noted. Ventricular size is within normal limits. Old left posterior parietal infarction is noted. New cortical low density is noted in right frontal lobe concerning for infarction of indeterminate age. Vascular: No hyperdense vessel or unexpected calcification. Skull: Normal. Negative for fracture or focal lesion. Sinuses/Orbits: No acute finding. Other: None. IMPRESSION: New focal low density is seen in right frontal lobe concerning for infarction of indeterminate age. MRI may be performed for further evaluation. Electronically Signed   By: Marijo Conception, M.D.   On: 06/27/2017 17:03   Mr Brain Wo Contrast  Result Date: 06/27/2017 CLINICAL DATA:  66 y/o F; altered level of consciousness and abnormal CT of head. EXAM: MRI HEAD WITHOUT CONTRAST TECHNIQUE: Multiplanar, multiecho pulse sequences of the brain and surrounding structures were obtained without intravenous contrast. COMPARISON:  04/15/2016 MRI head.  06/27/2017 CT head. FINDINGS: Brain:  Moderate motion degradation of multiple sequences. New region of hypoattenuation on CT corresponds to a infarction in the right anterolateral frontal lobe  measuring 3.0 x 2.9 x 3.2 cm (volume = 15 cm^3) with increased T2 FLAIR signal, mild local mass effect, and heterogeneous diffusion, likely subacute. Stable chronic small left parietal lobe infarction, chronic microvascular ischemic changes of white matter, and parenchymal volume loss of the brain. No intracranial hemorrhage, herniation, or effacement of basilar cisterns. Vascular: Normal flow voids. Skull and upper cervical spine: Normal marrow signal. Sinuses/Orbits: Negative. Other: None. IMPRESSION: 1. Right anterolateral frontal lobe subacute infarction measuring 15 cc. No associated hemorrhage or significant mass effect. 2. Stable small chronic left  parietal infarction, microvascular ischemic changes of white matter, and parenchymal volume loss of the brain. Electronically Signed   By: Kristine Garbe M.D.   On: 06/27/2017 22:49   Ct Abdomen Pelvis W Contrast  Result Date: 06/27/2017 CLINICAL DATA:  66 year old female on dialysis presenting with nausea, vomiting and diarrhea since Sunday. EXAM: CT ABDOMEN AND PELVIS WITH CONTRAST TECHNIQUE: Multidetector CT imaging of the abdomen and pelvis was performed using the standard protocol following bolus administration of intravenous contrast. CONTRAST:  100mL ISOVUE-300 IOPAMIDOL (ISOVUE-300) INJECTION 61% COMPARISON:  05/12/2013 FINDINGS: Lower chest: Stable cardiomegaly. Passive atelectasis at the lung bases. Small to moderate-sized hiatal hernia. Hepatobiliary: No focal liver abnormality is seen. No gallstones, gallbladder wall thickening, or biliary dilatation. Pancreas: Unremarkable. No pancreatic ductal dilatation or surrounding inflammatory changes. Spleen: No splenomegaly. Stable 16 mm hypodensity at the splenic hilum likely to represent a small splenic cyst. 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 Normal right adrenal gland. Renovascular calcifications are noted at  at the hila of both kidneys with perinephric fat stranding, chronic in appearance. No obstructive uropathy. No enhancing renal masses. The urinary bladder is decompressed. Stomach/Bowel: The stomach is distended with fluid and may reflect gastroparesis. Normal small bowel rotation without mural thickening or obstruction. Small 17 mm submucosal lipoma in the cecum. Normal appendix. No acute abnormality of the colon. Vascular/Lymphatic: Moderate aortoiliac and branch vessel atherosclerosis. Reproductive: Simple 3.6 cm left ovarian cyst. The uterus and right adnexa are unremarkable. Other: No free air free fluid. Musculoskeletal: Degenerative disc disease L5-S1 with partially calcified broad-based disc bulge. No acute nor suspicious osseous abnormality. IMPRESSION: 1. Fluid-filled distention of the stomach associated with a small to moderate-sized hiatal hernia. Question gastroparesis. Presence of hiatal hernia may predispose the patient to reflux given the fluid distention of the stomach. 2. Stable splenic 16 mm hypodensity at the hilum likely to represent a small splenic cyst. 3. 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 4. 17 mm submucosal lipoma within the cecum. 5. Simple 3.6 cm left ovarian cyst. Given stability since 2014, findings likely represent a benign etiology. 6. Degenerative disc disease L5-S1. Electronically Signed   By: David  Kwon M.D.   On: 06/27/2017 14:54   Us Carotid Bilateral  Result Date: 06/29/2017 CLINICAL DATA:  Stroke. CHF, syncope, confusion, hypertension, hyperlipidemia, diabetes. EXAM: BILATERAL CAROTID DUPLEX ULTRASOUND TECHNIQUE: Gray scale imaging, color Doppler and duplex ultrasound was performed of bilateral carotid and vertebral arteries in the neck. COMPARISON:  04/14/2016 TECHNIQUE: Quantification of carotid stenosis is based on velocity parameters that correlate the residual internal carotid diameter with NASCET-based stenosis levels, using the diameter of the distal  internal carotid lumen as the denominator for stenosis measurement. The following velocity measurements were obtained: PEAK SYSTOLIC/END DIASTOLIC RIGHT ICA:                     95/19cm/sec CCA:                     85/11cm/sec SYSTOLIC ICA/CCA RATIO:  1.1 DIASTOLIC ICA/CCA RATIO: 1.7 ECA:                     95cm/sec LEFT ICA:                     97 /16cm/sec CCA:  61/60VP/XTG SYSTOLIC ICA/CCA RATIO:  1.1 DIASTOLIC ICA/CCA RATIO: 1.4 ECA:                     139cm/sec FINDINGS: RIGHT CAROTID ARTERY: Eccentric partially calcified plaque in the distal common carotid artery, bulb, and proximal ICA. No high-grade stenosis. Normal waveforms and color Doppler signal. RIGHT VERTEBRAL ARTERY:  Normal flow direction and waveform. LEFT CAROTID ARTERY: Eccentric partially calcified plaque in the bulb and proximal ICA. No high-grade stenosis. Normal waveforms and color Doppler signal. LEFT VERTEBRAL ARTERY: Normal flow direction and waveform. Incidental note made of a mixed solid/cystic 2.5cm left thyroid lesion. IMPRESSION: 1. Bilateral carotid bifurcation and proximal ICA plaque resulting in less than 50% diameter stenosis. 2.  Antegrade bilateral vertebral arterial flow. 3. 2.5 cm left thyroid lesion. Consider further evaluation with thyroid ultrasound. If patient is clinically hyperthyroid, consider nuclear medicine thyroid uptake and scan. Electronically Signed   By: Lucrezia Europe M.D.   On: 06/29/2017 10:26      Impression / Plan:   Cynthia Dean is a 66 y.o. y/o female with hypertensive emergency on presentation with GI consulted for reported nausea vomiting and diarrhea  Due to patient's mental status, history was obtained from previous documents Patient's nausea vomiting is likely related to her subacute infarct and elevated blood pressures. Would recommend further workup and management per neurology for this  CT abdomen images and report reviewed and does not report any bowel  obstruction.  Fluid-filled distention of the stomach, moderate-sized hiatal hernia is noted.  If patient has ongoing emesis, would recommend NG tube to suction to prevent aspiration from frequent emesis Use antiemetics as necessary Treat elevated blood pressure and subacute infarct as per neurology Not recommend any endoscopic evaluation at this time, there is no indication for such acute anemia or signs of active GI bleeding.  If diarrhea is ongoing, would recommend ordering stool for C. difficile, stool cultures No blood in stool reported, no anemia present to indicate colonoscopy at this time.  Once patient is medically stabilized from her subacute infarct, elevated blood pressure standpoint, she should follow-up with primary care in 1-2 weeks, and GI in 3-4 weeks, and to discuss need for screening colonoscopies and EGDs if symptoms are ongoing.  Thank you for involving me in the care of this patient.      LOS: 2 days   Virgel Manifold, MD  06/29/2017, 11:31 AM

## 2017-06-30 DIAGNOSIS — R112 Nausea with vomiting, unspecified: Secondary | ICD-10-CM

## 2017-06-30 LAB — GLUCOSE, CAPILLARY
GLUCOSE-CAPILLARY: 153 mg/dL — AB (ref 65–99)
Glucose-Capillary: 207 mg/dL — ABNORMAL HIGH (ref 65–99)
Glucose-Capillary: 207 mg/dL — ABNORMAL HIGH (ref 65–99)

## 2017-06-30 MED ORDER — PANTOPRAZOLE SODIUM 40 MG PO TBEC
40.0000 mg | DELAYED_RELEASE_TABLET | Freq: Two times a day (BID) | ORAL | Status: DC
  Administered 2017-06-30 – 2017-07-01 (×2): 40 mg via ORAL
  Filled 2017-06-30 (×2): qty 1

## 2017-06-30 MED ORDER — CINACALCET HCL 30 MG PO TABS
60.0000 mg | ORAL_TABLET | Freq: Every day | ORAL | Status: DC
  Filled 2017-06-30: qty 2

## 2017-06-30 NOTE — Progress Notes (Addendum)
Cynthia Antigua, MD [address] [place], [place], [place], [address] [address], [place], [place], [place], [address] Phone: [phone]  Fax: [phone]   Subjective: Patient is nausea vomiting and diarrhea improved.  She is not having any further emesis.  Eating a full meal while I talked her today.  Has not had a bowel movement today.  Her blood pressures are also much better and she is being seen by nephrology for dialysis.   Objective: Vital signs in last 24 hours: Vitals:   06/29/17 1915 06/29/17 1920 06/29/17 2003 06/30/17 0355  BP: (!) 164/68 (!) 172/72 (!) 155/60 (!) 153/65  Pulse: 78 73 66 78  Resp: 18 18    Temp:  98.9 F (37.2 C) (!) 97.3 F (36.3 C) 98.3 F (36.8 C)  TempSrc:  Oral Oral Oral  SpO2: 100% 99% 92% 93%  Weight:      Height:       Weight change:   Intake/Output Summary (Last 24 hours) at 06/30/2017 1224 Last data filed at 06/30/2017 0900 Gross per 24 hour  Intake 720 ml  Output 0 ml  Net 720 ml     Exam: Cardiac: +S1, +S2, RRR, No edema Pulm: CTA b/l, Normal Resp Effort Abd: Soft, NT/ND, No HSM Skin: Warm, no rashes Neck: Supple, Trachea midline   Lab Results: @LABTEST2 @ Micro Results: Recent Results (from the past 240 hour(s))  MRSA PCR Screening     Status: None   Collection Time: 06/27/17  6:10 PM  Result Value Ref Range Status   MRSA by PCR NEGATIVE NEGATIVE Final    Comment:        The GeneXpert MRSA Assay (FDA approved for NASAL specimens only), is one component of a comprehensive MRSA colonization surveillance program. It is not intended to diagnose MRSA infection nor to guide or monitor treatment for MRSA infections.    Studies/Results: US Carotid Bilateral  Result Date: 06/29/2017 CLINICAL DATA:  Stroke. CHF, syncope, confusion, hypertension, hyperlipidemia, diabetes. EXAM: BILATERAL CAROTID DUPLEX ULTRASOUND TECHNIQUE: Pearline Cables scale imaging, color Doppler and duplex ultrasound was performed of bilateral  carotid and vertebral arteries in the neck. COMPARISON:  04/14/2016 TECHNIQUE: Quantification of carotid stenosis is based on velocity parameters that correlate the residual internal carotid diameter with NASCET-based stenosis levels, using the diameter of the distal internal carotid lumen as the denominator for stenosis measurement. The following velocity measurements were obtained: PEAK SYSTOLIC/END DIASTOLIC RIGHT ICA:                     95/19cm/sec CCA:                     17/40CX/KGY SYSTOLIC ICA/CCA RATIO:  1.1 DIASTOLIC ICA/CCA RATIO: 1.7 ECA:                     95cm/sec LEFT ICA:                     97/16cm/sec CCA:                     18/56DJ/SHF SYSTOLIC ICA/CCA RATIO:  1.1 DIASTOLIC ICA/CCA RATIO: 1.4 ECA:                     139cm/sec FINDINGS: RIGHT CAROTID ARTERY: Eccentric partially calcified plaque in the distal common carotid artery, bulb, and proximal ICA. No high-grade stenosis. Normal waveforms and color Doppler signal. RIGHT VERTEBRAL ARTERY:  Normal  flow direction and waveform. LEFT CAROTID ARTERY: Eccentric partially calcified plaque in the bulb and proximal ICA. No high-grade stenosis. Normal waveforms and color Doppler signal. LEFT VERTEBRAL ARTERY: Normal flow direction and waveform. Incidental note made of a mixed solid/cystic 2.5cm left thyroid lesion. IMPRESSION: 1. Bilateral carotid bifurcation and proximal ICA plaque resulting in less than 50% diameter stenosis. 2.  Antegrade bilateral vertebral arterial flow. 3. 2.5 cm left thyroid lesion. Consider further evaluation with thyroid ultrasound. If patient is clinically hyperthyroid, consider nuclear medicine thyroid uptake and scan. Electronically Signed   By: Lucrezia Europe M.D.   On: 06/29/2017 10:26   Medications:  Scheduled Meds: . aspirin EC  81 mg Oral Daily  . atorvastatin  80 mg Oral q1800  . chlorhexidine  15 mL Mouth Rinse BID  . clopidogrel  75 mg Oral Daily  . feeding supplement (NEPRO CARB STEADY)  237 mL Oral BID BM    . gabapentin  100 mg Oral Daily  . heparin injection (subcutaneous)  5,000 Units Subcutaneous Q8H  . insulin aspart  0-5 Units Subcutaneous QHS  . insulin aspart  0-9 Units Subcutaneous TID WC  . irbesartan  150 mg Oral Daily  . mouth rinse  15 mL Mouth Rinse q12n4p  . metoCLOPramide (REGLAN) injection  5 mg Intravenous Q8H  . metoprolol succinate  100 mg Oral QPM  . pantoprazole (PROTONIX) IV  40 mg Intravenous Q12H  . pneumococcal 23 valent vaccine  0.5 mL Intramuscular Tomorrow-1000   Continuous Infusions: PRN Meds:.ipratropium-albuterol, labetalol, lidocaine-prilocaine, prochlorperazine   Assessment: 66 year old female presented with hypertensive emergency, subacute brain infarct, with GI consult of her nausea vomiting   Plan: Symptoms much improved with control of elevated blood pressures, and were the likely cause of her symptoms. In the setting of subacute brain infarct, and improved symptoms, no indication for EGD or colonoscopy. No signs of active GI bleeding since presentation.  If nausea vomiting recurs, please do not hesitate to contact us. If diarrhea occurs, order stool studies, stool for C. difficile.  Diarrhea has resolved at this time  Patient should follow-up with her primary care doctor to determine when her screening or polyp surveillance colonoscopies are due and should be referred to GI accordingly.   GI service will sign off, please call with any questions.   LOS: 3 days   Cynthia Antigua, MD 06/30/2017, 12:24 PM

## 2017-06-30 NOTE — Progress Notes (Signed)
[facility], [place] 06/30/17  Subjective:   Patient feels like she is back to baseline She is able to talk normally Ate dinner last night and breakfast this morning without nausea or vomiting No edema or Shortness of breath. States she had best sleep in the last few days.  Difficult access cannulation last evening. Required second nurse to cannulate  Patient reports that she had some numbness in her right hand last night. It has resolved this morning. Patient's daughter is upset about an iv infiltration in her right hand in the ICU. At present, all hand symptoms are resolved. Today is her routine dialysis day.  Objective:  Vital signs in last 24 hours:  Temp:  [97.3 F (36.3 C)-98.9 F (37.2 C)] 98.3 F (36.8 C) (12/07 0355) Pulse Rate:  [66-86] 78 (12/07 0355) Resp:  [16-18] 18 (12/06 1920) BP: (115-185)/(60-113) 153/65 (12/07 0355) SpO2:  [92 %-100 %] 93 % (12/07 0355)  Weight change:  Filed Weights   06/27/17 1203 06/27/17 1800  Weight: 104.3 kg (230 lb) 121.6 kg (268 lb 1.3 oz)    Intake/Output:    Intake/Output Summary (Last 24 hours) at 06/30/2017 1025 Last data filed at 06/29/2017 2148 Gross per 24 hour  Intake 480 ml  Output 0 ml  Net 480 ml     Physical Exam: General:  Laying in the bed, no acute distress  HEENT  anicteric, moist oral mucous membrane  Neck  supple  Pulm/lungs  lungs are clear to auscultation,   CVS/Heart  tachycardic, no rub or gallop  Abdomen:   Soft, nondistended  Extremities:  No peripheral edema  Neurologic:  Alert and oriented,   Skin:  No acute rashes  Access:  Left upper arm AV access, good bruit       Basic Metabolic Panel:  Recent Labs  Lab 06/27/17 1307 06/28/17 1041 06/28/17 2228 06/29/17 0351  NA 142  --  137 135  K 3.7  --  3.7 3.9  CL 96*  --  94* 93*  CO2 27  --  24 26  GLUCOSE 127*  --  139* 169*  BUN 39*  --  65* 68*  CREATININE 7.77*  --  11.06* 11.43*  CALCIUM 10.1  --  9.1  9.1  MG  --   --  1.9  --   PHOS  --  7.0* 7.4*  --      CBC: Recent Labs  Lab 06/27/17 1307 06/29/17 0351  WBC 12.7* 10.0  NEUTROABS 11.2*  --   HGB 11.7* 11.3*  HCT 35.7 34.8*  MCV 88.6 89.6  PLT 254 243      Lab Results  Component Value Date   HEPBSAG Negative 06/28/2017      Microbiology:  Recent Results (from the past 240 hour(s))  MRSA PCR Screening     Status: None   Collection Time: 06/27/17  6:10 PM  Result Value Ref Range Status   MRSA by PCR NEGATIVE NEGATIVE Final    Comment:        The GeneXpert MRSA Assay (FDA approved for NASAL specimens only), is one component of a comprehensive MRSA colonization surveillance program. It is not intended to diagnose MRSA infection nor to guide or monitor treatment for MRSA infections.     Coagulation Studies: No results for input(s): LABPROT, INR in the last 72 hours.  Urinalysis: Recent Labs    06/27/17 1310  COLORURINE YELLOW*  LABSPEC 1.006  PHURINE 9.0*  GLUCOSEU >=500*  HGBUR NEGATIVE  BILIRUBINUR NEGATIVE  KETONESUR NEGATIVE  PROTEINUR >=300*  NITRITE NEGATIVE  LEUKOCYTESUR NEGATIVE      Imaging: US Carotid Bilateral  Result Date: 06/29/2017 CLINICAL DATA:  Stroke. CHF, syncope, confusion, hypertension, hyperlipidemia, diabetes. EXAM: BILATERAL CAROTID DUPLEX ULTRASOUND TECHNIQUE: Pearline Cables scale imaging, color Doppler and duplex ultrasound was performed of bilateral carotid and vertebral arteries in the neck. COMPARISON:  04/14/2016 TECHNIQUE: Quantification of carotid stenosis is based on velocity parameters that correlate the residual internal carotid diameter with NASCET-based stenosis levels, using the diameter of the distal internal carotid lumen as the denominator for stenosis measurement. The following velocity measurements were obtained: PEAK SYSTOLIC/END DIASTOLIC RIGHT ICA:                     95/19cm/sec CCA:                     38/45XM/IWO SYSTOLIC ICA/CCA RATIO:  1.1 DIASTOLIC ICA/CCA  RATIO: 1.7 ECA:                     95cm/sec LEFT ICA:                     97/16cm/sec CCA:                     03/21YY/QMG SYSTOLIC ICA/CCA RATIO:  1.1 DIASTOLIC ICA/CCA RATIO: 1.4 ECA:                     139cm/sec FINDINGS: RIGHT CAROTID ARTERY: Eccentric partially calcified plaque in the distal common carotid artery, bulb, and proximal ICA. No high-grade stenosis. Normal waveforms and color Doppler signal. RIGHT VERTEBRAL ARTERY:  Normal flow direction and waveform. LEFT CAROTID ARTERY: Eccentric partially calcified plaque in the bulb and proximal ICA. No high-grade stenosis. Normal waveforms and color Doppler signal. LEFT VERTEBRAL ARTERY: Normal flow direction and waveform. Incidental note made of a mixed solid/cystic 2.5cm left thyroid lesion. IMPRESSION: 1. Bilateral carotid bifurcation and proximal ICA plaque resulting in less than 50% diameter stenosis. 2.  Antegrade bilateral vertebral arterial flow. 3. 2.5 cm left thyroid lesion. Consider further evaluation with thyroid ultrasound. If patient is clinically hyperthyroid, consider nuclear medicine thyroid uptake and scan. Electronically Signed   By: Lucrezia Europe M.D.   On: 06/29/2017 10:26     Medications:    . aspirin EC  81 mg Oral Daily  . atorvastatin  80 mg Oral q1800  . chlorhexidine  15 mL Mouth Rinse BID  . clopidogrel  75 mg Oral Daily  . feeding supplement (NEPRO CARB STEADY)  237 mL Oral BID BM  . gabapentin  100 mg Oral Daily  . heparin injection (subcutaneous)  5,000 Units Subcutaneous Q8H  . insulin aspart  0-5 Units Subcutaneous QHS  . insulin aspart  0-9 Units Subcutaneous TID WC  . irbesartan  150 mg Oral Daily  . mouth rinse  15 mL Mouth Rinse q12n4p  . metoCLOPramide (REGLAN) injection  5 mg Intravenous Q8H  . metoprolol succinate  100 mg Oral QPM  . pantoprazole (PROTONIX) IV  40 mg Intravenous Q12H  . pneumococcal 23 valent vaccine  0.5 mL Intramuscular Tomorrow-1000   ipratropium-albuterol, labetalol,  lidocaine-prilocaine, prochlorperazine  Assessment/ Plan:  66 y.o. female with end-stage renal disease, COPD, coronary disease, diabetes type 2, with neuropathy, diabetic retinopathy, hyperlipidemia, severe hypertension, carpal tunnel left hand, cervical spine arthritis, history of stroke, diastolic heart failure, esophagitis, morbid obesity,  myelopathy of the cervical spinal cord with cervical radiculopathy, obstructive sleep apnea  [facility]/MWF/[facility] [place] [facility]/ 240 MIN/ 124.5 KG  1.  End-stage renal disease, on hemodialysis 2.  Severe hypertension 3.  Anemia of chronic kidney disease 4.  Secondary hyperparathyroidism 5.  Diabetes type 2 with complications of neuropathy and retinopathy and CKD 6.  Nausea, vomiting, diarrhea with hematemesis, likely gastroparesis 7.  Altered mental status and speech. MRI shows new Right frontal infarct     Plan: Patient was offered the choice of dialysis today inpatient vs tomorrow at the outpatient center (due to schedule changes related to weather). She states she will be able to go to treatment on Monday. Patient's daughter wanted to call her PMD to get her opinion about dialysis. Dialysis orders have been placed for today. Patient's family will let the staff know about their decision.  BP control has improved with current regimen.  Blood sugars are close to 150-200 Phos high- may resume home dose of binders     LOS: [place] 12/7/201810:25 [place], [place]

## 2017-06-30 NOTE — Progress Notes (Signed)
[facility] [place] NAME: Cynthia Dean    MR#:  [id number]  DATE OF BIRTH:  [birth year]  SUBJECTIVE:   Pt's daughter at bedside and mentions that her mom's right hand went numb yesterday where the IV was placed.  Pt's presently denies any complaints.  Family is contemplating if they want dialysis here today  REVIEW OF SYSTEMS:    Review of Systems  Constitutional: Negative for chills and fever.  HENT: Negative for congestion and tinnitus.   Eyes: Negative for blurred vision and double vision.  Respiratory: Negative for cough, shortness of breath and wheezing.   Cardiovascular: Negative for chest pain, orthopnea and PND.  Gastrointestinal: Negative for abdominal pain, diarrhea, nausea and vomiting.  Genitourinary: Negative for dysuria and hematuria.  Neurological: Negative for dizziness, sensory change and focal weakness.  All other systems reviewed and are negative.   Nutrition: Renal Diet Tolerating Diet: Yes Tolerating PT: Eval noted.   DRUG ALLERGIES:   Allergies  Allergen Reactions  . Tape Itching    Skin Dermatitis/itching (tape adhesive)    VITALS:  Blood pressure (!) 171/70, pulse 90, temperature 98.7 F (37.1 C), temperature source Oral, resp. rate 18, height 5\' 6"  (1.676 m), weight 124.7 kg (274 lb 14.6 oz), SpO2 96 %.  PHYSICAL EXAMINATION:   Physical Exam  GENERAL:  66 y.o.-year-old obese patient lying in bed in no acute distress.  EYES: Pupils equal, round, reactive to light and accommodation. No scleral icterus. Extraocular muscles intact.  HEENT: Head atraumatic, normocephalic. Oropharynx and nasopharynx clear.  NECK:  Supple, no jugular venous distention. No thyroid enlargement, no tenderness.  LUNGS: Normal breath sounds bilaterally, no wheezing, rales, rhonchi. No use of accessory muscles of respiration.  CARDIOVASCULAR: S1, S2 normal. No murmurs, rubs, or gallops.  ABDOMEN: Soft, nontender, nondistended.  Bowel sounds present. No organomegaly or mass.  EXTREMITIES: No cyanosis, clubbing or edema b/l.    NEUROLOGIC: Cranial nerves II through XII are intact. No focal Motor or sensory deficits b/l. Globally weak  PSYCHIATRIC: The patient is alert and oriented x 3.  SKIN: No obvious rash, lesion, or ulcer.   Left Upper Ext. AV fistula with good bruit and thrill.    LABORATORY PANEL:   CBC Recent Labs  Lab 06/29/17 0351  WBC 10.0  HGB 11.3*  HCT 34.8*  PLT 243   ------------------------------------------------------------------------------------------------------------------  Chemistries  Recent Labs  Lab 06/27/17 1307 06/28/17 2228 06/29/17 0351  NA 142 137 135  K 3.7 3.7 3.9  CL 96* 94* 93*  CO2 27 24 26   GLUCOSE 127* 139* 169*  BUN 39* 65* 68*  CREATININE 7.77* 11.06* 11.43*  CALCIUM 10.1 9.1 9.1  MG  --  1.9  --   AST 26  --   --   ALT 17  --   --   ALKPHOS 91  --   --   BILITOT 0.7  --   --    ------------------------------------------------------------------------------------------------------------------  Cardiac Enzymes Recent Labs  Lab 06/27/17 1307  TROPONINI 0.07*   ------------------------------------------------------------------------------------------------------------------  RADIOLOGY:  US Carotid Bilateral  Result Date: 06/29/2017 CLINICAL DATA:  Stroke. CHF, syncope, confusion, hypertension, hyperlipidemia, diabetes. EXAM: BILATERAL CAROTID DUPLEX ULTRASOUND TECHNIQUE: Pearline Cables scale imaging, color Doppler and duplex ultrasound was performed of bilateral carotid and vertebral arteries in the neck. COMPARISON:  04/14/2016 TECHNIQUE: Quantification of carotid stenosis is based on velocity parameters that correlate the residual internal carotid diameter with NASCET-based stenosis levels, using the diameter of the  distal internal carotid lumen as the denominator for stenosis measurement. The following velocity measurements were obtained: PEAK SYSTOLIC/END  DIASTOLIC RIGHT ICA:                     95/19cm/sec CCA:                     60/73XT/GGY SYSTOLIC ICA/CCA RATIO:  1.1 DIASTOLIC ICA/CCA RATIO: 1.7 ECA:                     95cm/sec LEFT ICA:                     97/16cm/sec CCA:                     69/48NI/OEV SYSTOLIC ICA/CCA RATIO:  1.1 DIASTOLIC ICA/CCA RATIO: 1.4 ECA:                     139cm/sec FINDINGS: RIGHT CAROTID ARTERY: Eccentric partially calcified plaque in the distal common carotid artery, bulb, and proximal ICA. No high-grade stenosis. Normal waveforms and color Doppler signal. RIGHT VERTEBRAL ARTERY:  Normal flow direction and waveform. LEFT CAROTID ARTERY: Eccentric partially calcified plaque in the bulb and proximal ICA. No high-grade stenosis. Normal waveforms and color Doppler signal. LEFT VERTEBRAL ARTERY: Normal flow direction and waveform. Incidental note made of a mixed solid/cystic 2.5cm left thyroid lesion. IMPRESSION: 1. Bilateral carotid bifurcation and proximal ICA plaque resulting in less than 50% diameter stenosis. 2.  Antegrade bilateral vertebral arterial flow. 3. 2.5 cm left thyroid lesion. Consider further evaluation with thyroid ultrasound. If patient is clinically hyperthyroid, consider nuclear medicine thyroid uptake and scan. Electronically Signed   By: Lucrezia Europe M.D.   On: 06/29/2017 10:26     ASSESSMENT AND PLAN:   66 year old female with past medical history of end-stage renal disease on hemodialysis, essential hypertension, hyperlipidemia, diabetes, COPD who presented to the hospital due to abdominal pain nausea vomiting and also accelerated hypertension.  1. Acute CVA -this was noted on a MRI of the brain. This is likely acute ischemic CVA from uncontrolled hypertension. -Continue aspirin, Plavix, statin.  -appreciate neurology input and cont. Current care.  - PT eval noted.    2. Accelerated hypertension- BP improved.  - cont. Avapro, Toprol, PRN IV labetalol.  3. Intractable nausea and  vomiting-secondary to hiatal hernia/gastroparesis is seen on the CT scan of the abdomen and pelvis on admission. -much improved with IV fluids and antiemetics. Seen by gastroenterology and no plans for acute intervention for now as pt. Has improved.  - outpatient follow up.   4. End-stage renal disease on hemodialysis-nephrology has been consulted. Patient gets dialysis on Monday Wednesday Friday.  - discussed with patient and daughter and they want to talk to pt's PCP prior to getting dialysis here.    5. Diabetes type 2 without complication-continue sliding scale insulin for now. BS stable.   6. Diabetic neuropathy-continue gabapentin.  7. GERD-continue Protonix.  PT recommends SNF but pt's has Medicare [employer] and walked 40 ft and likely will not be eligible for SNF.  Will dc home with Home Health today possibly      All the records are reviewed and case discussed with Care Management/Social Worker. Management plans discussed with the patient, family and they are in agreement.  CODE STATUS: Full  DVT Prophylaxis: Heparin subcutaneous  TOTAL TIME TAKING CARE OF THIS PATIENT: 35 minutes.  POSSIBLE D/C IN 1-2 DAYS, DEPENDING ON CLINICAL CONDITION.   Henreitta Leber M.D on 06/30/2017 at 8:36 PM  Between 7am to 6pm - Pager - [phone]  After 6pm go to [email or web address] - Proofreader  [facility]  Office  [phone]  CC: Primary care physician; Lowella Bandy, MD

## 2017-06-30 NOTE — Progress Notes (Signed)
Notifed Dr. Jerelyn Charles after patient returned to room; patient c/o discomfort and request to be monitored ; family at bediside. Will continue to monitor

## 2017-06-30 NOTE — Evaluation (Signed)
Physical Therapy Evaluation Patient Details Name: Cynthia Dean MRN: [id number] DOB: 06-May-1951 Today's Date: 06/30/2017   History of Present Illness  Pt is a 66 yo F here due to intractable N/V and also noted to have coffee ground emesis. Also noted to have accelerated HTN and noted to have ischemic CVA on MRI.No further N/V or abdominal pain presently.Blood pressure still remains elevated.  Pt reports uses 2LO2/min at home PRN.  Assessment includes: acute CVA, HTN, intractable N/V much improved, ESRD on HD, DM II, diabetic neuropathy, and GERD.    Clinical Impression  Pt presents with deficits in strength, gait, balance, and activity tolerance.  During assessment no asymmetrical deficits in strength, deficits in sensation/proprioception, or deficits in coordination noted.  Pt presented with good functional strength during short bursts such as during bed mobility and sit to/from stand transfers but fatigued very quickly during amb.  Pt only able to amb 40' with RW with cadence progressively slowing to the point where pt believed she might fall from fatigue if had to walk any further.  Pt's SpO2 95% and HR 89 bpm on 2LO2/min at rest with SpO2 decreasing to 93% and HR increasing to 110 bpm after amb.  Step training/assessment deferred this session for pt safety secondary to difficulty with limited amb on level surfaces.  Pt will benefit from a likely short duration of PT services in a SNF setting upon discharge to safely address above deficits for decreased caregiver assistance and eventual return to PLOF.      Follow Up Recommendations SNF    Equipment Recommendations  Rolling walker with 5" wheels;Other (comment)(Bariatric; TBD at next venue of care if pt discharges to a SNF)    Recommendations for Other Services       Precautions / Restrictions Precautions Precautions: Fall Restrictions Weight Bearing Restrictions: No      Mobility  Bed Mobility Overal bed mobility:  Independent             General bed mobility comments: Good speed and confidence with bed mobility tasks  Transfers Overall transfer level: Needs assistance Equipment used: Rolling walker (2 wheeled) Transfers: Sit to/from Stand Sit to Stand: Supervision         General transfer comment: Pt steady upon initial stand from EOB  Ambulation/Gait Ambulation/Gait assistance: Min guard Ambulation Distance (Feet): 40 Feet Assistive device: Rolling walker (2 wheeled) Gait Pattern/deviations: Step-through pattern;Decreased step length - right;Decreased step length - left   Gait velocity interpretation: Below normal speed for age/gender General Gait Details: Slow cadence with gait that worsened near end of amb with pt fatiguing quickly and reporting that felt as if BLEs were going to give out and cause a fall near the end of amb back to EOB.  Stairs Stairs: (Deferred for pt safety)          Wheelchair Mobility    Modified Rankin (Stroke Patients Only)       Balance Overall balance assessment: Needs assistance Sitting-balance support: Feet unsupported;Feet supported;No upper extremity supported Sitting balance-Leahy Scale: Normal     Standing balance support: Bilateral upper extremity supported;During functional activity Standing balance-Leahy Scale: Good                               Pertinent Vitals/Pain Pain Assessment: No/denies pain    Home Living Family/patient expects to be discharged to:: Private residence(Pt with some confusion and difficulty with details during history with daughters assisting; daughters  report this is baseline for patient) Living Arrangements: Alone Available Help at Discharge: Family;Available 24 hours/day Type of Home: House Home Access: Stairs to enter Entrance Stairs-Rails: Right;Left(Too wide for both) Entrance Stairs-Number of Steps: 3 Home Layout: One level Home Equipment: None      Prior Function Level of  Independence: Independent         Comments: Ind with amb limited community distances without AD, no fall history, Ind with ADLs     Hand Dominance   Dominant Hand: Right    Extremity/Trunk Assessment   Upper Extremity Assessment Upper Extremity Assessment: Overall WFL for tasks assessed;RUE deficits/detail;LUE deficits/detail RUE Deficits / Details: RUE strength WFL RUE Sensation: (Sensation to light touch and proprioception grossly intact) RUE Coordination: (No deficits noted during finger to nose and RAM assessment) LUE Deficits / Details: LUE strength not formally tested secondary to HD port but functionally WFL LUE Sensation: (Sensation to light touch and proprioception grossly intact) LUE Coordination: (No deficits noted during finger to nose and RAM assessment)    Lower Extremity Assessment Lower Extremity Assessment: Generalized weakness;RLE deficits/detail;LLE deficits/detail RLE Deficits / Details: RLE strength grossly WFL for one rep but fatigued quickly with functional tasks RLE Sensation: (Sensation to light touch and proprioception grossly intact) LLE Deficits / Details: LLE strength grossly WFL for one rep but fatigued quickly with functional tasks LLE Sensation: (Sensation to light touch and proprioception grossly intact)       Communication   Communication: No difficulties  Cognition Arousal/Alertness: Awake/alert Behavior During Therapy: WFL for tasks assessed/performed Overall Cognitive Status: History of cognitive impairments - at baseline                                 General Comments: Pt able to follow commands well but did have some difficulty providing accurate history, daughters report this is baseline for patient      General Comments      Exercises Total Joint Exercises Ankle Circles/Pumps: AROM;Both;10 reps Hip ABduction/ADduction: AROM;Both;5 reps Long Arc Quad: Strengthening;Both;5 reps Knee Flexion: Web designer in Standing: AROM;Both;10 reps Other Exercises Other Exercises: Unsupported sitting with reaching outside BOS   Assessment/Plan    PT Assessment Patient needs continued PT services  PT Problem List Decreased strength;Decreased activity tolerance;Decreased balance;Decreased knowledge of use of DME       PT Treatment Interventions DME instruction;Gait training;Stair training;Neuromuscular re-education;Functional mobility training;Balance training;Therapeutic exercise;Therapeutic activities;Patient/family education    PT Goals (Current goals can be found in the Care Plan section)  Acute Rehab PT Goals Patient Stated Goal: To walk longer distances PT Goal Formulation: With patient/family Time For Goal Achievement: 07/13/17 Potential to Achieve Goals: Good    Frequency 7X/week   Barriers to discharge Inaccessible home environment      Co-evaluation               AM-PAC PT "6 Clicks" Daily Activity  Outcome Measure Difficulty turning over in bed (including adjusting bedclothes, sheets and blankets)?: None Difficulty moving from lying on back to sitting on the side of the bed? : None Difficulty sitting down on and standing up from a chair with arms (e.g., wheelchair, bedside commode, etc,.)?: None Help needed moving to and from a bed to chair (including a wheelchair)?: A Little Help needed walking in hospital room?: A Little Help needed climbing 3-5 steps with a railing? : A Lot 6 Click Score: 20    End of  Session Equipment Utilized During Treatment: Gait belt;Oxygen Activity Tolerance: Patient limited by fatigue Patient left: in bed;with call bell/phone within reach;with bed alarm set;with family/visitor present Nurse Communication: Mobility status PT Visit Diagnosis: Muscle weakness (generalized) (M62.81);Difficulty in walking, not elsewhere classified (R26.2)    Time: 1340-1425 PT Time Calculation (min) (ACUTE ONLY): 45 min   Charges:   PT  Evaluation $PT Eval Low Complexity: 1 Low PT Treatments $Therapeutic Exercise: 8-22 mins   PT G Codes:   PT G-Codes **NOT FOR INPATIENT CLASS** Functional Assessment Tool Used: AM-PAC 6 Clicks Basic Mobility Functional Limitation: Mobility: Walking and moving around Mobility: Walking and Moving Around Current Status (T0160): At least 20 percent but less than 40 percent impaired, limited or restricted Mobility: Walking and Moving Around Goal Status [phone]): 0 percent impaired, limited or restricted    D. Royetta Asal PT, DPT 06/30/17, 2:48 PM

## 2017-06-30 NOTE — Progress Notes (Signed)
Subjective: Patient and family report that she is back to baseline.  Speech more effortless.    Objective: Current vital signs: BP (!) 153/65 (BP Location: Right Arm)   Pulse 78   Temp 98.3 F (36.8 C) (Oral)   Resp 18   Ht 5\' 6"  (1.676 m)   Wt 121.6 kg (268 lb 1.3 oz)   SpO2 93%   BMI 43.27 kg/m  Vital signs in last 24 hours: Temp:  [97.3 F (36.3 C)-98.9 F (37.2 C)] 98.3 F (36.8 C) (12/07 0355) Pulse Rate:  [66-86] 78 (12/07 0355) Resp:  [16-18] 18 (12/06 1920) BP: (115-185)/(60-113) 153/65 (12/07 0355) SpO2:  [92 %-100 %] 93 % (12/07 0355)  Intake/Output from previous day: 12/06 0701 - 12/07 0700 In: 480 [P.O.:480] Out: 0  Intake/Output this shift: Total I/O In: 240 [P.O.:240] Out: -  Nutritional status: Diet heart healthy/carb modified Room service appropriate? Yes; Fluid consistency: Thin  Neurologic Exam: Mental Status: Alert, oriented, thought content appropriate.  Speech fluent without evidence of aphasia.  Able to follow 3 step commands with minor prompting. Cranial Nerves: II: Discs flat bilaterally; Visual fields grossly normal, pupils equal, round, reactive to light and accommodation III,IV, VI: ptosis not present, extra-ocular motions intact bilaterally V,VII: decrease in left NLF, facial light touch sensation normal bilaterally VIII: hearing normal bilaterally IX,X: gag reflex present XI: bilateral shoulder shrug XII: midline tongue extension Motor: Right :  Upper extremity   5/5                                      Left:     Upper extremity   5/5             Lower extremity   5/5                                                  Lower extremity   5/5  Lab Results: Basic Metabolic Panel: Recent Labs  Lab 06/27/17 1307 06/28/17 1041 06/28/17 2228 06/29/17 0351  NA 142  --  137 135  K 3.7  --  3.7 3.9  CL 96*  --  94* 93*  CO2 27  --  24 26  GLUCOSE 127*  --  139* 169*  BUN 39*  --  65* 68*  CREATININE 7.77*  --  11.06* 11.43*  CALCIUM  10.1  --  9.1 9.1  MG  --   --  1.9  --   PHOS  --  7.0* 7.4*  --     Liver Function Tests: Recent Labs  Lab 06/27/17 1307  AST 26  ALT 17  ALKPHOS 91  BILITOT 0.7  PROT 8.4*  ALBUMIN 4.1   Recent Labs  Lab 06/27/17 1307  LIPASE 39   No results for input(s): AMMONIA in the last 168 hours.  CBC: Recent Labs  Lab 06/27/17 1307 06/29/17 0351  WBC 12.7* 10.0  NEUTROABS 11.2*  --   HGB 11.7* 11.3*  HCT 35.7 34.8*  MCV 88.6 89.6  PLT 254 243    Cardiac Enzymes: Recent Labs  Lab 06/27/17 1307  TROPONINI 0.07*    Lipid Panel: Recent Labs  Lab 06/29/17 0351  CHOL 133  TRIG 142  HDL 38*  CHOLHDL 3.5  VLDL  [place]    CBG: Recent Labs  Lab 06/28/17 1806 06/28/17 2114 06/29/17 1149 06/29/17 2128 06/30/17 0734  GLUCAP 192* 150* 174* 211* 153*    Microbiology: Results for orders placed or performed during the hospital encounter of 06/27/17  MRSA PCR Screening     Status: None   Collection Time: 06/27/17  6:10 PM  Result Value Ref Range Status   MRSA by PCR NEGATIVE NEGATIVE Final    Comment:        The GeneXpert MRSA Assay (FDA approved for NASAL specimens only), is one component of a comprehensive MRSA colonization surveillance program. It is not intended to diagnose MRSA infection nor to guide or monitor treatment for MRSA infections.     Coagulation Studies: No results for input(s): LABPROT, INR in the last 72 hours.  Imaging: US Carotid Bilateral  Result Date: 06/29/2017 CLINICAL DATA:  Stroke. CHF, syncope, confusion, hypertension, hyperlipidemia, diabetes. EXAM: BILATERAL CAROTID DUPLEX ULTRASOUND TECHNIQUE: Pearline Cables scale imaging, color Doppler and duplex ultrasound was performed of bilateral carotid and vertebral arteries in the neck. COMPARISON:  04/14/2016 TECHNIQUE: Quantification of carotid stenosis is based on velocity parameters that correlate the residual internal carotid diameter with NASCET-based stenosis levels, using  the diameter of the distal internal carotid lumen as the denominator for stenosis measurement. The following velocity measurements were obtained: PEAK SYSTOLIC/END DIASTOLIC RIGHT ICA:                     95/19cm/sec CCA:                     56/81EX/NTZ SYSTOLIC ICA/CCA RATIO:  1.1 DIASTOLIC ICA/CCA RATIO: 1.7 ECA:                     95cm/sec LEFT ICA:                     97/16cm/sec CCA:                     00/17CB/SWH SYSTOLIC ICA/CCA RATIO:  1.1 DIASTOLIC ICA/CCA RATIO: 1.4 ECA:                     139cm/sec FINDINGS: RIGHT CAROTID ARTERY: Eccentric partially calcified plaque in the distal common carotid artery, bulb, and proximal ICA. No high-grade stenosis. Normal waveforms and color Doppler signal. RIGHT VERTEBRAL ARTERY:  Normal flow direction and waveform. LEFT CAROTID ARTERY: Eccentric partially calcified plaque in the bulb and proximal ICA. No high-grade stenosis. Normal waveforms and color Doppler signal. LEFT VERTEBRAL ARTERY: Normal flow direction and waveform. Incidental note made of a mixed solid/cystic 2.5cm left thyroid lesion. IMPRESSION: 1. Bilateral carotid bifurcation and proximal ICA plaque resulting in less than 50% diameter stenosis. 2.  Antegrade bilateral vertebral arterial flow. 3. 2.5 cm left thyroid lesion. Consider further evaluation with thyroid ultrasound. If patient is clinically hyperthyroid, consider nuclear medicine thyroid uptake and scan. Electronically Signed   By: Lucrezia Europe M.D.   On: 06/29/2017 10:26    Medications:  I have reviewed the patient's current medications. Scheduled: . aspirin EC  81 mg Oral Daily  . atorvastatin  80 mg Oral q1800  . chlorhexidine  15 mL Mouth Rinse BID  . clopidogrel  75 mg Oral Daily  . feeding supplement (NEPRO CARB STEADY)  237 mL Oral BID BM  . gabapentin  100 mg Oral Daily  . heparin injection (subcutaneous)  5,000 Units  Subcutaneous Q8H  . insulin aspart  0-5 Units Subcutaneous QHS  . insulin aspart  0-9 Units Subcutaneous  TID WC  . irbesartan  150 mg Oral Daily  . mouth rinse  15 mL Mouth Rinse q12n4p  . metoCLOPramide (REGLAN) injection  5 mg Intravenous Q8H  . metoprolol succinate  100 mg Oral QPM  . pantoprazole (PROTONIX) IV  40 mg Intravenous Q12H  . pneumococcal 23 valent vaccine  0.5 mL Intramuscular Tomorrow-1000    Assessment/Plan: Patient back to baseline.  Carotid dopplers show no evidence of hemodynamically significant stenosis.  Echocardiogram shows no cardiac source of emboli with an EF of 55-65%.  A1c 6.3, LDL 67.  Patient on Plavix and ASA.  Recommendations: 1.  Patient to continue Plavix and ASA. 2.  Would consider prolonged cardiac monitoring as an outpatient.     LOS: 3 days   Alexis Goodell, MD Neurology [phone] 06/30/2017  10:32 AM

## 2017-06-30 NOTE — Care Management Important Message (Signed)
Important Message  Patient Details  Name: Cynthia Dean MRN: [id number] Date of Birth: September 27, 1950   Medicare Important Message Given:  Yes    [name], RN 06/30/2017, 5:19 PM

## 2017-06-30 NOTE — Discharge Summary (Addendum)
[facility] [place] NAME: Cynthia Dean    MR#:  [id number]  [place]:  February 15, 1951  DATE OF ADMISSION:  06/27/2017 ADMITTING PHYSICIAN: Loletha Grayer, MD  DATE OF DISCHARGE: 07/01/2017  PRIMARY CARE PHYSICIAN: Lowella Bandy, MD    ADMISSION DIAGNOSIS:  Gastroparesis due to DM ([place]) [E11.43, K31.84] Hematemesis [K92.0]  DISCHARGE DIAGNOSIS:  Active Problems:   Hematemesis   SECONDARY DIAGNOSIS:   Past Medical History:  Diagnosis Date  . Chronic kidney disease   . COPD (chronic obstructive pulmonary disease) ([place])   . Coronary artery disease   . Diabetes mellitus without complication ([place])   . Heart murmur   . Hyperlipidemia   . Hypertension     HOSPITAL COURSE:   66 year old female with past medical history of end-stage renal disease on hemodialysis, essential hypertension, hyperlipidemia, diabetes, COPD who presented to the hospital due to abdominal pain nausea vomiting and also accelerated hypertension.  1. Acute CVA - this was noted on MRI of the brain on admission. Patient presented with accelerated hypertension and MRI confirmed a Right anterolateral frontal lobe subacute infarction.   -Patient was seen by neurology and he recommended continuing the patient's aspirin, Plavix, high-dose intensity statin. -Patient's neurological status has remained stable. She has no focal weakness, numbness or tingling presently. She was seen by physical therapy and is being arranged for home health physical therapy services.  2. Accelerated hypertension-  patient's blood pressure on admission was significantly uncontrolled with systolic blood pressures ranging 200. She was given IV labetalol, hydralazine. Also her oral meds were resumed and she was started on hemodialysis. Her hemodynamics have significantly improved. -She will continue her maintenance meds including metoprolol, valsartan, Avapro upon discharge.    3.  Intractable nausea and vomiting-secondary to hiatal hernia/gastroparesis is seen on the CT scan of the abdomen and pelvis on admission. -Patient was treated supportively with IV fluids antiemetics has improved. She was seen by gastroenterology and they did not recommend any acute intervention given her acute CVA and Accelerated hypertension. The recommended outpatient follow-up. -Patient has significantly improved since admission and denies any nausea vomiting abdominal pain presently. She will continue her PPI.   4. End-stage renal disease on hemodialysis-patient was seen by nephrology and underwent hemodialysis while in the hospital and tolerated it well. She will resume her dialysis on the Monday Wednesday Friday schedule and follow-up as an outpatient.  5. Diabetes type 2 without complication-while in the hospital patient was on sliding scale insulin but will resume her Novolin 70/30 upon discharge.  6. Diabetic neuropathy- pt. Will continue gabapentin.  7. GERD-pt. Will cont. Her Omeprazole.   8. Copd with slight with slight rhonchi lungs, give nebulizer this and, start symbicort and augmentin.  D/c Home today with Home Health PT, RN.   DISCHARGE CONDITIONS:   Stable.   CONSULTS OBTAINED:  Treatment Team:  Murlean Iba, MD Virgel Manifold, MD Catarina Hartshorn, MD  DRUG ALLERGIES:   Allergies  Allergen Reactions  . Tape Itching    Skin Dermatitis/itching (tape adhesive)    DISCHARGE MEDICATIONS:   Allergies as of 07/01/2017      Reactions   Tape Itching   Skin Dermatitis/itching (tape adhesive)      Medication List    TAKE these medications   amoxicillin-clavulanate 500-125 MG tablet Commonly known as:  AUGMENTIN Take 1 tablet (500 mg total) by mouth at bedtime.   aspirin 81 MG tablet Take 81 mg by mouth  daily.   atorvastatin 80 MG tablet Commonly known as:  LIPITOR Take 80 mg by mouth every evening.   budesonide-formoterol 160-4.5 MCG/ACT  inhaler Commonly known as:  SYMBICORT Inhale 2 puffs into the lungs 2 (two) times daily.   cinacalcet 60 MG tablet Commonly known as:  SENSIPAR Take 60 mg by mouth daily.   clopidogrel 75 MG tablet Commonly known as:  PLAVIX TAKE ONE TABLET BY MOUTH ONCE DAILY   feeding supplement (NEPRO CARB STEADY) Liqd Take 237 mLs by mouth 2 (two) times daily between meals.   gabapentin 100 MG capsule Commonly known as:  NEURONTIN Take 100 mg by mouth 3 (three) times daily.   insulin NPH-regular Human (70-30) 100 UNIT/ML injection Commonly known as:  NOVOLIN 70/30 Inject 40 Units into the skin 2 (two) times daily with a meal. Except dialysis days. Do not take on Tuesdays, Thursdays, and Saturdays.   ipratropium-albuterol 0.5-2.5 (3) MG/3ML Soln Commonly known as:  DUONEB Take 3 mLs by nebulization every 6 (six) hours as needed (shortness of breath/ wheezing).   albuterol-ipratropium 18-103 MCG/ACT inhaler Commonly known as:  COMBIVENT Inhale 2 puffs into the lungs 2 (two) times daily.   irbesartan 150 MG tablet Commonly known as:  AVAPRO Take 1 tablet by mouth daily.   lidocaine-prilocaine cream Commonly known as:  EMLA Apply 1 application topically as needed (port access).   metoprolol succinate 100 MG 24 hr tablet Commonly known as:  TOPROL-XL Take 100 mg by mouth every evening. Take with or immediately following a meal.   omeprazole 20 MG capsule Commonly known as:  PRILOSEC Take 20 mg by mouth 2 (two) times daily.   pregabalin 50 MG capsule Commonly known as:  LYRICA Take 50 mg by mouth 2 (two) times daily.   sevelamer carbonate 800 MG tablet Commonly known as:  RENVELA Take 2,400 mg by mouth 3 (three) times daily with meals.   valsartan 160 MG tablet Commonly known as:  DIOVAN Take 160 mg by mouth every evening.         DISCHARGE INSTRUCTIONS:  F/u pmd one week DIET:  Cardiac diet, Diabetic diet and Renal diet  DISCHARGE CONDITION:  Stable  ACTIVITY:   Activity as tolerated  OXYGEN:  Home Oxygen: No.   Oxygen Delivery: room air  DISCHARGE LOCATION:  Home with Home Health PT, RN.    If you experience worsening of your admission symptoms, develop shortness of breath, life threatening emergency, suicidal or homicidal thoughts you must seek medical attention immediately by calling 911 or calling your MD immediately  if symptoms less severe.  You Must read complete instructions/literature along with all the possible adverse reactions/side effects for all the Medicines you take and that have been prescribed to you. Take any new Medicines after you have completely understood and accpet all the possible adverse reactions/side effects.   Please note  You were cared for by a hospitalist during your hospital stay. If you have any questions about your discharge medications or the care you received while you were in the hospital after you are discharged, you can call the unit and asked to speak with the hospitalist on call if the hospitalist that took care of you is not available. Once you are discharged, your primary care physician will handle any further medical issues. Please note that NO REFILLS for any discharge medications will be authorized once you are discharged, as it is imperative that you return to your primary care physician (or establish a relationship with  a primary care physician if you do not have one) for your aftercare needs so that they can reassess your need for medications and monitor your lab values.     Today   No acute events overnight.  BP improved.    VITAL SIGNS:  Blood pressure (!) 141/55, pulse 81, temperature 98.3 F (36.8 C), temperature source Oral, resp. rate 18, height 5\' 6"  (1.676 m), weight 124.7 kg (274 lb 14.6 oz), SpO2 95 %.  I/O:    Intake/Output Summary (Last 24 hours) at 07/01/2017 0907 Last data filed at 06/30/2017 1925 Gross per 24 hour  Intake -  Output 0 ml  Net 0 ml    PHYSICAL EXAMINATION:    GENERAL:  66 y.o.-year-old patient lying in bed in no acute distress.  EYES: Pupils equal, round, reactive to light and accommodation. No scleral icterus. Extraocular muscles intact.  HEENT: Head atraumatic, normocephalic. Oropharynx and nasopharynx clear.  NECK:  Supple, no jugular venous distention. No thyroid enlargement, no tenderness.  LUNGS: Normal breath sounds bilaterally, Slight expiratory wheezing and rhonchi.  No use of accessory muscles of respiration.  CARDIOVASCULAR: S1, S2 normal. No murmurs, rubs, or gallops.  ABDOMEN: Soft, nontender, nondistended. Bowel sounds present. No organomegaly or mass.  EXTREMITIES: No cyanosis, clubbing or edema b/l.    NEUROLOGIC: Cranial nerves II through XII are intact. No focal Motor or sensory deficits b/l. PSYCHIATRIC: The patient is alert and oriented x 3.  SKIN: No obvious rash, lesion, or ulcer.   Left Upper Ext. AV fistula with good bruit and thrill.    DATA REVIEW:   CBC Recent Labs  Lab 06/29/17 0351  WBC 10.0  HGB 11.3*  HCT 34.8*  PLT 243    Chemistries  Recent Labs  Lab 06/27/17 1307 06/28/17 2228 06/29/17 0351  NA 142 137 135  K 3.7 3.7 3.9  CL 96* 94* 93*  CO2 27 24 26   GLUCOSE 127* 139* 169*  BUN 39* 65* 68*  CREATININE 7.77* 11.06* 11.43*  CALCIUM 10.1 9.1 9.1  MG  --  1.9  --   AST 26  --   --   ALT 17  --   --   ALKPHOS 91  --   --   BILITOT 0.7  --   --     Cardiac Enzymes Recent Labs  Lab 06/27/17 1307  TROPONINI 0.07*    Microbiology Results  Results for orders placed or performed during the hospital encounter of 06/27/17  MRSA PCR Screening     Status: None   Collection Time: 06/27/17  6:10 PM  Result Value Ref Range Status   MRSA by PCR NEGATIVE NEGATIVE Final    Comment:        The GeneXpert MRSA Assay (FDA approved for NASAL specimens only), is one component of a comprehensive MRSA colonization surveillance program. It is not intended to diagnose MRSA infection nor to guide  or monitor treatment for MRSA infections.     RADIOLOGY:  US Carotid Bilateral  Result Date: 06/29/2017 CLINICAL DATA:  Stroke. CHF, syncope, confusion, hypertension, hyperlipidemia, diabetes. EXAM: BILATERAL CAROTID DUPLEX ULTRASOUND TECHNIQUE: Pearline Cables scale imaging, color Doppler and duplex ultrasound was performed of bilateral carotid and vertebral arteries in the neck. COMPARISON:  04/14/2016 TECHNIQUE: Quantification of carotid stenosis is based on velocity parameters that correlate the residual internal carotid diameter with NASCET-based stenosis levels, using the diameter of the distal internal carotid lumen as the denominator for stenosis measurement. The following velocity measurements were obtained:  PEAK SYSTOLIC/END DIASTOLIC RIGHT ICA:                     95/19cm/sec CCA:                     58/09XI/PJA SYSTOLIC ICA/CCA RATIO:  1.1 DIASTOLIC ICA/CCA RATIO: 1.7 ECA:                     95cm/sec LEFT ICA:                     97/16cm/sec CCA:                     25/05LZ/JQB SYSTOLIC ICA/CCA RATIO:  1.1 DIASTOLIC ICA/CCA RATIO: 1.4 ECA:                     139cm/sec FINDINGS: RIGHT CAROTID ARTERY: Eccentric partially calcified plaque in the distal common carotid artery, bulb, and proximal ICA. No high-grade stenosis. Normal waveforms and color Doppler signal. RIGHT VERTEBRAL ARTERY:  Normal flow direction and waveform. LEFT CAROTID ARTERY: Eccentric partially calcified plaque in the bulb and proximal ICA. No high-grade stenosis. Normal waveforms and color Doppler signal. LEFT VERTEBRAL ARTERY: Normal flow direction and waveform. Incidental note made of a mixed solid/cystic 2.5cm left thyroid lesion. IMPRESSION: 1. Bilateral carotid bifurcation and proximal ICA plaque resulting in less than 50% diameter stenosis. 2.  Antegrade bilateral vertebral arterial flow. 3. 2.5 cm left thyroid lesion. Consider further evaluation with thyroid ultrasound. If patient is clinically hyperthyroid, consider nuclear  medicine thyroid uptake and scan. Electronically Signed   By: Lucrezia Europe M.D.   On: 06/29/2017 10:26      Management plans discussed with the patient, family and they are in agreement.  CODE STATUS:  Code Status History    Date Active Date Inactive Code Status Order ID Comments User Context   04/14/2016 13:30 04/14/2016 17:00 Full Code [id number]  Bettey Costa, MD ED    TOTAL TIME TAKING CARE OF THIS PATIENT: 40 minutes.    Loletha Grayer M.D on 07/01/2017 at 9:07 AM  Between 7am to 6pm - Pager - [phone]  After 6pm go to [email or web address] - Proofreader  [facility]  Office  [phone]  CC: Primary care physician; Lowella Bandy, MD

## 2017-06-30 NOTE — Care Management (Signed)
Notified by MD that patient is to discharge home today with Home health services.  Met with patient's husband, as the patient is off the floor in HD.  Lives at home with husband.  PCP [name].  Husband states that patient has RW in the home.  HD laision is aware that patient is to discharge today.  Provided home health agency preference.  Husband states that he does not have preference of agency.  Referral made to [facility] with [place].  RW delivered to room prior to discharge,  RNCM signing off.

## 2017-07-01 LAB — GLUCOSE, CAPILLARY: Glucose-Capillary: 193 mg/dL — ABNORMAL HIGH (ref 65–99)

## 2017-07-01 MED ORDER — AMOXICILLIN-POT CLAVULANATE 500-125 MG PO TABS
1.0000 | ORAL_TABLET | Freq: Every day | ORAL | Status: DC
  Administered 2017-07-01: 500 mg via ORAL
  Filled 2017-07-01: qty 1

## 2017-07-01 MED ORDER — AMOXICILLIN-POT CLAVULANATE 500-125 MG PO TABS: 1.0000 | ORAL_TABLET | Freq: Every day | ORAL | 0 refills | Status: DC

## 2017-07-01 MED ORDER — BUDESONIDE-FORMOTEROL FUMARATE 160-4.5 MCG/ACT IN AERO: 2.0000 | INHALATION_SPRAY | Freq: Two times a day (BID) | RESPIRATORY_TRACT | 12 refills | Status: DC

## 2017-07-01 MED ORDER — NEPRO/CARBSTEADY PO LIQD: 237.0000 mL | Freq: Two times a day (BID) | ORAL | 0 refills | Status: DC

## 2017-07-01 NOTE — Care Management Note (Signed)
Case Management Note  Patient Details  Name: Cynthia Dean MRN: [id number] Date of Birth: 06/23/1951  Subjective/Objective:     A referral for HH=PT, RN and a request for a RW was called to [facility] yesterday. Discharge home today order called to [facility] today.                Action/Plan:   Expected Discharge Date:  07/01/17               Expected Discharge Plan:  [place]  In-House Referral:     Discharge planning Services  CM Consult  Post Acute Care Choice:  Durable Medical Equipment, Home Health Choice offered to:  Spouse  DME Arranged:  [facility] Rile rolling DME Agency:  [place] Arranged:  RN, PT [facility] Agency:  [place]  Status of Service:  Completed, signed off  If discussed at [place] of Stay Meetings, dates discussed:    Additional Comments:  Judieth Mckown A, RN 07/01/2017, 10:05 AM

## 2017-07-01 NOTE — Progress Notes (Signed)
Patient discharge teaching given, including activity, diet, follow-up appoints, and medications. Patient verbalized understanding of all discharge instructions. IV access was d/c'd. Vitals are stable. Skin is intact except as charted in most recent assessments. Pt to be escorted out by NT, to be driven home by family.  Aidric Endicott  

## 2017-07-01 NOTE — Progress Notes (Signed)
PT Cancellation Note  Patient Details Name: Cynthia Dean MRN: [id number] DOB: Dec 07, 1950   Cancelled Treatment:    Reason Eval/Treat Not Completed: Other (comment)   Pt awaiting discharge, has no further questions and feels comfortable with discharge.   Chesley Noon 07/01/2017, 9:17 AM

## 2017-07-01 NOTE — Progress Notes (Signed)
Patient ID: Cynthia Dean, female   DOB: [birth year], 66 y.o.   MRN: [id number]  [facility] PROGRESS NOTE  Twyla Dais HKV:[id number] DOB: Nov 08, 1950 DOA: 06/27/2017 PCP: Lowella Bandy, MD  HPI/Subjective: Patient feels like she is getting a little bit of a cold.  Some cough and some wheeze.  She has her nebulizers at home and Combivent inhaler at home.  Objective: Vitals:   06/30/17 2027 07/01/17 0551  BP: (!) 171/70 (!) 141/55  Pulse: 90 81  Resp: 18   Temp: 98.7 F (37.1 C) 98.3 F (36.8 C)  SpO2: 96% 95%    Filed Weights   06/27/17 1800 06/30/17 1530 06/30/17 1925  Weight: 121.6 kg (268 lb 1.3 oz) 125 kg (275 lb 9.2 oz) 124.7 kg (274 lb 14.6 oz)    ROS: Review of Systems  Constitutional: Negative for chills and fever.  Eyes: Negative for blurred vision.  Respiratory: Positive for cough and wheezing. Negative for shortness of breath.   Cardiovascular: Negative for chest pain.  Gastrointestinal: Negative for abdominal pain, constipation, diarrhea, nausea and vomiting.  Genitourinary: Negative for dysuria.  Musculoskeletal: Negative for joint pain.  Neurological: Negative for dizziness and headaches.   Exam: Physical Exam  HENT:  Nose: No mucosal edema.  Mouth/Throat: No oropharyngeal exudate or posterior oropharyngeal edema.  Eyes: Conjunctivae, EOM and lids are normal. Pupils are equal, round, and reactive to light.  Neck: No JVD present. Carotid bruit is not present. No edema present. No thyroid mass and no thyromegaly present.  Cardiovascular: S1 normal and S2 normal. Exam reveals no gallop.  No murmur heard. Pulses:      Dorsalis pedis pulses are 2+ on the right side, and 2+ on the left side.  Respiratory: No respiratory distress. She has wheezes in the right lower field and the left lower field. She has rhonchi in the right lower field and the left lower field. She has no rales.  GI: Soft. Bowel sounds are normal. There is no  tenderness.  Musculoskeletal:       Right shoulder: She exhibits no swelling.  Lymphadenopathy:    She has no cervical adenopathy.  Neurological: She is alert. No cranial nerve deficit.  Skin: Skin is warm. No rash noted. Nails show no clubbing.  Psychiatric: She has a normal mood and affect.      Data Reviewed: Basic Metabolic Panel: Recent Labs  Lab 06/27/17 1307 06/28/17 1041 06/28/17 2228 06/29/17 0351  NA 142  --  137 135  K 3.7  --  3.7 3.9  CL 96*  --  94* 93*  CO2 27  --  24 26  GLUCOSE 127*  --  139* 169*  BUN 39*  --  65* 68*  CREATININE 7.77*  --  11.06* 11.43*  CALCIUM 10.1  --  9.1 9.1  MG  --   --  1.9  --   PHOS  --  7.0* 7.4*  --    Liver Function Tests: Recent Labs  Lab 06/27/17 1307  AST 26  ALT 17  ALKPHOS 91  BILITOT 0.7  PROT 8.4*  ALBUMIN 4.1   Recent Labs  Lab 06/27/17 1307  LIPASE 39   No results for input(s): AMMONIA in the last 168 hours. CBC: Recent Labs  Lab 06/27/17 1307 06/29/17 0351  WBC 12.7* 10.0  NEUTROABS 11.2*  --   HGB 11.7* 11.3*  HCT 35.7 34.8*  MCV 88.6 89.6  PLT 254 243   Cardiac Enzymes: Recent Labs  Lab 06/27/17 1307  TROPONINI 0.07*   BNP (last 3 results) No results for input(s): BNP in the last 8760 hours.  ProBNP (last 3 results) No results for input(s): PROBNP in the last 8760 hours.  CBG: Recent Labs  Lab 06/29/17 2128 06/30/17 0734 06/30/17 1145 06/30/17 2214 07/01/17 0746  GLUCAP 211* 153* 207* 207* 193*    Recent Results (from the past 240 hour(s))  MRSA PCR Screening     Status: None   Collection Time: 06/27/17  6:10 PM  Result Value Ref Range Status   MRSA by PCR NEGATIVE NEGATIVE Final    Comment:        The GeneXpert MRSA Assay (FDA approved for NASAL specimens only), is one component of a comprehensive MRSA colonization surveillance program. It is not intended to diagnose MRSA infection nor to guide or monitor treatment for MRSA infections.      Studies: US  Carotid Bilateral  Result Date: 06/29/2017 CLINICAL DATA:  Stroke. CHF, syncope, confusion, hypertension, hyperlipidemia, diabetes. EXAM: BILATERAL CAROTID DUPLEX ULTRASOUND TECHNIQUE: Pearline Cables scale imaging, color Doppler and duplex ultrasound was performed of bilateral carotid and vertebral arteries in the neck. COMPARISON:  04/14/2016 TECHNIQUE: Quantification of carotid stenosis is based on velocity parameters that correlate the residual internal carotid diameter with NASCET-based stenosis levels, using the diameter of the distal internal carotid lumen as the denominator for stenosis measurement. The following velocity measurements were obtained: PEAK SYSTOLIC/END DIASTOLIC RIGHT ICA:                     95/19cm/sec CCA:                     22/97LG/XQJ SYSTOLIC ICA/CCA RATIO:  1.1 DIASTOLIC ICA/CCA RATIO: 1.7 ECA:                     95cm/sec LEFT ICA:                     97/16cm/sec CCA:                     19/41DE/YCX SYSTOLIC ICA/CCA RATIO:  1.1 DIASTOLIC ICA/CCA RATIO: 1.4 ECA:                     139cm/sec FINDINGS: RIGHT CAROTID ARTERY: Eccentric partially calcified plaque in the distal common carotid artery, bulb, and proximal ICA. No high-grade stenosis. Normal waveforms and color Doppler signal. RIGHT VERTEBRAL ARTERY:  Normal flow direction and waveform. LEFT CAROTID ARTERY: Eccentric partially calcified plaque in the bulb and proximal ICA. No high-grade stenosis. Normal waveforms and color Doppler signal. LEFT VERTEBRAL ARTERY: Normal flow direction and waveform. Incidental note made of a mixed solid/cystic 2.5cm left thyroid lesion. IMPRESSION: 1. Bilateral carotid bifurcation and proximal ICA plaque resulting in less than 50% diameter stenosis. 2.  Antegrade bilateral vertebral arterial flow. 3. 2.5 cm left thyroid lesion. Consider further evaluation with thyroid ultrasound. If patient is clinically hyperthyroid, consider nuclear medicine thyroid uptake and scan. Electronically Signed   By: Lucrezia Europe  M.D.   On: 06/29/2017 10:26    Scheduled Meds: . amoxicillin-clavulanate  1 tablet Oral QHS  . aspirin EC  81 mg Oral Daily  . atorvastatin  80 mg Oral q1800  . chlorhexidine  15 mL Mouth Rinse BID  . cinacalcet  60 mg Oral Q supper  . clopidogrel  75 mg Oral Daily  . feeding supplement (NEPRO CARB STEADY)  237 mL Oral BID BM  . gabapentin  100 mg Oral Daily  . heparin injection (subcutaneous)  5,000 Units Subcutaneous Q8H  . insulin aspart  0-5 Units Subcutaneous QHS  . insulin aspart  0-9 Units Subcutaneous TID WC  . irbesartan  150 mg Oral Daily  . mouth rinse  15 mL Mouth Rinse q12n4p  . metoCLOPramide (REGLAN) injection  5 mg Intravenous Q8H  . metoprolol succinate  100 mg Oral QPM  . pantoprazole  40 mg Oral BID  . pneumococcal 23 valent vaccine  0.5 mL Intramuscular Tomorrow-1000   Continuous Infusions:  Assessment/Plan:  1. Acute CVA.  Patient back on aspirin Plavix and statin 2. Accelerated hypertension.  Blood pressure improved since admission 3. Intractable nausea vomiting.  This has settled down.  Could be secondary to the CVA. 4. End-stage renal disease.  Patient received dialysis yesterday.  Keep on Monday Wednesday Friday schedule 5. COPD with slight wheeze and rhonchi.  Start Symbicort and Augmentin.  Give a nebulizer treatment this morning.  Has nebulizer inhaler at home. 6. Type 2 diabetes mellitus on Novolin 70/30 7. Diabetic neuropathy on gabapentin 8. GERD on PPI  Code Status:  Code Status History    Date Active Date Inactive Code Status Order ID Comments User Context   04/14/2016 13:30 04/14/2016 17:00 Full Code [id number]  Bettey Costa, MD ED   08/08/2015 22:57 08/09/2015 20:54 Full Code [id number]  Lytle Butte, MD ED   04/17/2015 13:34 04/17/2015 18:36 Full Code [id number]  Algernon Huxley, MD Inpatient     Family Communication: Family at bedside Disposition Plan: Discharge home with home health  Time spent: 40 minutes  Arthur

## 2017-07-11 ENCOUNTER — Other Ambulatory Visit: Payer: Self-pay | Admitting: Pediatrics

## 2017-07-11 DIAGNOSIS — E041 Nontoxic single thyroid nodule: Secondary | ICD-10-CM

## 2017-08-18 ENCOUNTER — Telehealth: Payer: Self-pay | Admitting: Gastroenterology

## 2017-08-18 ENCOUNTER — Encounter (INDEPENDENT_AMBULATORY_CARE_PROVIDER_SITE_OTHER): Payer: Self-pay

## 2017-08-18 NOTE — Telephone Encounter (Signed)
Left voice message for patient to call and schedule a 3-4 week hospital follow up with Dr. Bonna Gains.

## 2017-08-21 ENCOUNTER — Telehealth: Payer: Self-pay | Admitting: Gastroenterology

## 2017-08-21 ENCOUNTER — Encounter: Payer: Self-pay | Admitting: Gastroenterology

## 2017-08-21 NOTE — Telephone Encounter (Signed)
Left voice message for patient to call and schedule a hospital follow up with Dr Bonna Gains. Letter sent

## 2017-08-23 ENCOUNTER — Other Ambulatory Visit (INDEPENDENT_AMBULATORY_CARE_PROVIDER_SITE_OTHER): Payer: Self-pay | Admitting: Vascular Surgery

## 2017-08-30 MED ORDER — CEFAZOLIN SODIUM-DEXTROSE 1-4 GM/50ML-% IV SOLN: 1.0000 g | Freq: Once | INTRAVENOUS | Status: DC

## 2017-08-31 ENCOUNTER — Encounter (INDEPENDENT_AMBULATORY_CARE_PROVIDER_SITE_OTHER): Payer: Self-pay

## 2017-08-31 ENCOUNTER — Other Ambulatory Visit (INDEPENDENT_AMBULATORY_CARE_PROVIDER_SITE_OTHER): Payer: Self-pay | Admitting: Vascular Surgery

## 2017-09-11 ENCOUNTER — Encounter: Payer: Self-pay | Admitting: *Deleted

## 2017-09-11 ENCOUNTER — Encounter
Admission: RE | Disposition: A | Discharge status: Home / Self Care | Payer: Self-pay | Source: Ambulatory Visit | Attending: Vascular Surgery

## 2017-09-11 ENCOUNTER — Ambulatory Visit
Admission: RE | Admit: 2017-09-11 | Discharge: 2017-09-11 | Disposition: A | Discharge status: Home / Self Care | Payer: Medicare HMO | Source: Ambulatory Visit | Attending: Vascular Surgery | Admitting: Vascular Surgery

## 2017-09-11 DIAGNOSIS — Z8249 Family history of ischemic heart disease and other diseases of the circulatory system: Secondary | ICD-10-CM | POA: Insufficient documentation

## 2017-09-11 DIAGNOSIS — I120 Hypertensive chronic kidney disease with stage 5 chronic kidney disease or end stage renal disease: Secondary | ICD-10-CM | POA: Insufficient documentation

## 2017-09-11 DIAGNOSIS — E1122 Type 2 diabetes mellitus with diabetic chronic kidney disease: Secondary | ICD-10-CM | POA: Diagnosis not present

## 2017-09-11 DIAGNOSIS — E785 Hyperlipidemia, unspecified: Secondary | ICD-10-CM | POA: Insufficient documentation

## 2017-09-11 DIAGNOSIS — Z9889 Other specified postprocedural states: Secondary | ICD-10-CM | POA: Diagnosis not present

## 2017-09-11 DIAGNOSIS — Z803 Family history of malignant neoplasm of breast: Secondary | ICD-10-CM | POA: Diagnosis not present

## 2017-09-11 DIAGNOSIS — I2510 Atherosclerotic heart disease of native coronary artery without angina pectoris: Secondary | ICD-10-CM | POA: Diagnosis not present

## 2017-09-11 DIAGNOSIS — Z9109 Other allergy status, other than to drugs and biological substances: Secondary | ICD-10-CM | POA: Insufficient documentation

## 2017-09-11 DIAGNOSIS — Z888 Allergy status to other drugs, medicaments and biological substances status: Secondary | ICD-10-CM | POA: Insufficient documentation

## 2017-09-11 DIAGNOSIS — Y832 Surgical operation with anastomosis, bypass or graft as the cause of abnormal reaction of the patient, or of later complication, without mention of misadventure at the time of the procedure: Secondary | ICD-10-CM | POA: Diagnosis not present

## 2017-09-11 DIAGNOSIS — Z992 Dependence on renal dialysis: Secondary | ICD-10-CM | POA: Diagnosis not present

## 2017-09-11 DIAGNOSIS — J449 Chronic obstructive pulmonary disease, unspecified: Secondary | ICD-10-CM | POA: Insufficient documentation

## 2017-09-11 DIAGNOSIS — Z823 Family history of stroke: Secondary | ICD-10-CM | POA: Diagnosis not present

## 2017-09-11 DIAGNOSIS — E119 Type 2 diabetes mellitus without complications: Secondary | ICD-10-CM | POA: Diagnosis not present

## 2017-09-11 DIAGNOSIS — N186 End stage renal disease: Secondary | ICD-10-CM | POA: Diagnosis not present

## 2017-09-11 DIAGNOSIS — I10 Essential (primary) hypertension: Secondary | ICD-10-CM | POA: Diagnosis not present

## 2017-09-11 DIAGNOSIS — T82858A Stenosis of vascular prosthetic devices, implants and grafts, initial encounter: Secondary | ICD-10-CM | POA: Insufficient documentation

## 2017-09-11 DIAGNOSIS — Z87891 Personal history of nicotine dependence: Secondary | ICD-10-CM | POA: Insufficient documentation

## 2017-09-11 DIAGNOSIS — T82868A Thrombosis of vascular prosthetic devices, implants and grafts, initial encounter: Secondary | ICD-10-CM | POA: Diagnosis not present

## 2017-09-11 HISTORY — PX: A/V SHUNTOGRAM: CATH118297

## 2017-09-11 LAB — POTASSIUM (ARMC VASCULAR LAB ONLY): POTASSIUM (ARMC VASCULAR LAB): 4.4 (ref 3.5–5.1)

## 2017-09-11 SURGERY — A/V SHUNTOGRAM
Anesthesia: Moderate Sedation | Laterality: Left

## 2017-09-11 MED ORDER — MIDAZOLAM HCL 5 MG/5ML IJ SOLN
INTRAMUSCULAR | Status: AC
  Filled 2017-09-11: qty 5

## 2017-09-11 MED ORDER — HEPARIN (PORCINE) IN NACL 2-0.9 UNIT/ML-% IJ SOLN
INTRAMUSCULAR | Status: AC
  Filled 2017-09-11: qty 1000

## 2017-09-11 MED ORDER — HEPARIN SODIUM (PORCINE) 1000 UNIT/ML IJ SOLN
INTRAMUSCULAR | Status: DC | PRN
  Administered 2017-09-11: 3000 [IU] via INTRAVENOUS

## 2017-09-11 MED ORDER — FAMOTIDINE 20 MG PO TABS: 40.0000 mg | ORAL_TABLET | ORAL | Status: DC | PRN

## 2017-09-11 MED ORDER — METHYLPREDNISOLONE SODIUM SUCC 125 MG IJ SOLR: 125.0000 mg | INTRAMUSCULAR | Status: DC | PRN

## 2017-09-11 MED ORDER — HEPARIN SODIUM (PORCINE) 1000 UNIT/ML IJ SOLN
INTRAMUSCULAR | Status: AC
  Filled 2017-09-11: qty 1

## 2017-09-11 MED ORDER — HYDROMORPHONE HCL 1 MG/ML IJ SOLN: 1.0000 mg | Freq: Once | INTRAMUSCULAR | Status: DC | PRN

## 2017-09-11 MED ORDER — SODIUM CHLORIDE 0.9 % IV SOLN
INTRAVENOUS | Status: DC
  Administered 2017-09-11: 10:00:00 via INTRAVENOUS

## 2017-09-11 MED ORDER — LIDOCAINE-EPINEPHRINE (PF) 1 %-1:200000 IJ SOLN
INTRAMUSCULAR | Status: AC
  Filled 2017-09-11: qty 30

## 2017-09-11 MED ORDER — FENTANYL CITRATE (PF) 100 MCG/2ML IJ SOLN
INTRAMUSCULAR | Status: DC | PRN
  Administered 2017-09-11: 25 ug via INTRAVENOUS
  Administered 2017-09-11: 50 ug via INTRAVENOUS
  Administered 2017-09-11: 25 ug via INTRAVENOUS

## 2017-09-11 MED ORDER — ONDANSETRON HCL 4 MG/2ML IJ SOLN: 4.0000 mg | Freq: Four times a day (QID) | INTRAMUSCULAR | Status: DC | PRN

## 2017-09-11 MED ORDER — FENTANYL CITRATE (PF) 100 MCG/2ML IJ SOLN
INTRAMUSCULAR | Status: AC
  Filled 2017-09-11: qty 2

## 2017-09-11 MED ORDER — MIDAZOLAM HCL 2 MG/2ML IJ SOLN
INTRAMUSCULAR | Status: DC | PRN
  Administered 2017-09-11 (×2): 1 mg via INTRAVENOUS
  Administered 2017-09-11: 2 mg via INTRAVENOUS

## 2017-09-11 SURGICAL SUPPLY — 13 items
BALLN DORADO7X100X80 (BALLOONS) ×2
BALLOON DORADO7X100X80 (BALLOONS) ×1 IMPLANT
CANNULA 5F STIFF (CANNULA) ×2 IMPLANT
COVER PROBE U/S 5X48 (MISCELLANEOUS) ×2 IMPLANT
DEVICE PRESTO INFLATION (MISCELLANEOUS) ×2 IMPLANT
DRAPE BRACHIAL (DRAPES) ×2 IMPLANT
PACK ANGIOGRAPHY (CUSTOM PROCEDURE TRAY) ×2 IMPLANT
SHEATH BRITE TIP 6FRX5.5 (SHEATH) ×2 IMPLANT
SUT MNCRL 4-0 (SUTURE) ×1
SUT MNCRL 4-0 27XMFL (SUTURE) ×1
SUTURE MNCRL 4-0 27XMF (SUTURE) ×1 IMPLANT
TOWEL OR 17X26 4PK STRL BLUE (TOWEL DISPOSABLE) ×2 IMPLANT
WIRE MAGIC TOR.035 180C (WIRE) ×2 IMPLANT

## 2017-09-11 NOTE — H&P (Signed)
[facility] Admission History & Physical  MRN : [id number]  Cynthia Dean is a 67 y.o. (1951/02/19) female who presents with chief complaint of No chief complaint on file. [name]  History of Present Illness: I am asked to evaluate the patient by the dialysis center. The patient was sent here because they were unable to achieve adequate dialysis this morning. Furthermore the Center states there is very poor thrill and bruit. The patient states there there have been increasing problems with the access, such as "pulling clots" during dialysis and prolonged bleeding after decannulation. The patient estimates these problems have been going on for several weeks. The patient is unaware of any other change.  Patient denies pain or tenderness overlying the access.  There is no pain with dialysis.  The patient denies hand pain or finger pain consistent with steal syndrome.   There have been many past interventions or declots of this access.  The patient is not chronically hypotensive on dialysis.  Current Facility-Administered Medications  Medication Dose Route Frequency Provider Last Rate Last Dose  . ceFAZolin (ANCEF) IVPB 1 g/50 mL premix  1 g Intravenous Once Stegmayer, Janalyn Harder, PA-C        Past Medical History:  Diagnosis Date  . Chronic kidney disease   . COPD (chronic obstructive pulmonary disease) ([place])   . Coronary artery disease   . Diabetes mellitus without complication ([place])   . Heart murmur   . Hyperlipidemia   . Hypertension     Past Surgical History:  Procedure Laterality Date  . A/V FISTULAGRAM Left 12/20/2016   Procedure: A/V Fistulagram;  Surgeon: Katha Cabal, MD;  Location: [facility];  Service: Cardiovascular;  Laterality: Left;  . A/V SHUNT INTERVENTION N/A 12/20/2016   Procedure: A/V Shunt Intervention;  Surgeon: Katha Cabal, MD;  Location: [facility];  Service: Cardiovascular;  Laterality: N/A;  . BREAST  BIOPSY Bilateral 07/19/00   neg  . PERIPHERAL VASCULAR CATHETERIZATION Left 02/02/2015   Procedure: A/V Shuntogram/Fistulagram;  Surgeon: Algernon Huxley, MD;  Location: [place] [facility] [facility];  Service: Cardiovascular;  Laterality: Left;  . PERIPHERAL VASCULAR CATHETERIZATION Left 02/02/2015   Procedure: A/V Shunt Intervention;  Surgeon: Algernon Huxley, MD;  Location: [facility];  Service: Cardiovascular;  Laterality: Left;  . PERIPHERAL VASCULAR CATHETERIZATION Left 03/09/2015   Procedure: A/V Shuntogram/Fistulagram;  Surgeon: Algernon Huxley, MD;  Location: [facility] [facility];  Service: Cardiovascular;  Laterality: Left;  . PERIPHERAL VASCULAR CATHETERIZATION N/A 03/09/2015   Procedure: A/V Shunt Intervention;  Surgeon: Algernon Huxley, MD;  Location: [place] [facility];  Service: Cardiovascular;  Laterality: N/A;  . PERIPHERAL VASCULAR CATHETERIZATION Left 04/17/2015   Procedure: Upper Extremity Angiography;  Surgeon: Algernon Huxley, MD;  Location: [facility];  Service: Cardiovascular;  Laterality: Left;  . PERIPHERAL VASCULAR CATHETERIZATION  04/17/2015   Procedure: Upper Extremity Intervention;  Surgeon: Algernon Huxley, MD;  Location: [place] [facility] [facility];  Service: Cardiovascular;;  . PERIPHERAL VASCULAR CATHETERIZATION N/A 08/10/2015   Procedure: A/V Shuntogram/Fistulagram;  Surgeon: Algernon Huxley, MD;  Location: [place] [facility];  Service: Cardiovascular;  Laterality: N/A;  . PERIPHERAL VASCULAR CATHETERIZATION N/A 08/10/2015   Procedure: A/V Shunt Intervention;  Surgeon: Algernon Huxley, MD;  Location: [facility];  Service: Cardiovascular;  Laterality: N/A;  . PERIPHERAL VASCULAR CATHETERIZATION Left 04/11/2016   Procedure: A/V Shuntogram/Fistulagram;  Surgeon: Algernon Huxley, MD;  Location:  [place] [facility];  Service: Cardiovascular;  Laterality: Left;    Social History Social History   Tobacco Use  . Smoking status: Former Smoker    Packs/day: 0.00    Types: Cigarettes  .  Smokeless tobacco: Never Used  Substance Use Topics  . Alcohol use: No  . Drug use: No    Family History Family History  Problem Relation Age of Onset  . Breast cancer Father 81  . Stroke Mother   . Heart attack Mother   . Heart Problems Sister   . Breast cancer Cousin 53       1 st cousin. Maternal     No family history of bleeding or clotting disorders, autoimmune disease or porphyria  Allergies  Allergen Reactions  . Tape Itching    Skin Dermatitis/itching (tape adhesive)     REVIEW OF SYSTEMS (Negative unless checked)  Constitutional: [] Weight loss  [] Fever  [] Chills Cardiac: [] Chest pain   [] Chest pressure   [] Palpitations   [] Shortness of breath when laying flat   [] Shortness of breath at rest   [x] Shortness of breath with exertion. Vascular:  [] Pain in legs with walking   [] Pain in legs at rest   [] Pain in legs when laying flat   [] Claudication   [] Pain in feet when walking  [] Pain in feet at rest  [] Pain in feet when laying flat   [] History of DVT   [] Phlebitis   [] Swelling in legs   [] Varicose veins   [] Non-healing ulcers Pulmonary:   [] Uses home oxygen   [] Productive cough   [] Hemoptysis   [] Wheeze  [] COPD   [] Asthma Neurologic:  [] Dizziness  [] Blackouts   [] Seizures   [] History of stroke   [] History of TIA  [] Aphasia   [] Temporary blindness   [] Dysphagia   [] Weakness or numbness in arms   [] Weakness or numbness in legs Musculoskeletal:  [x] Arthritis   [] Joint swelling   [] Joint pain   [] Low back pain Hematologic:  [] Easy bruising  [] Easy bleeding   [] Hypercoagulable state   [] Anemic  [] Hepatitis Gastrointestinal:  [] Blood in stool   [] Vomiting blood  [] Gastroesophageal reflux/heartburn   [] Difficulty swallowing. Genitourinary:  [x] Chronic kidney disease   [] Difficult urination  [] Frequent urination  [] Burning with urination   [] Blood in urine Skin:  [] Rashes   [] Ulcers   [] Wounds Psychological:  [] History of anxiety   []  History of major depression.  Physical  Examination  There were no vitals filed for this visit. There is no height or weight on file to calculate BMI. Gen: WD/WN, NAD Head: [place]/AT, No temporalis wasting.  Ear/Nose/Throat: Hearing grossly intact, nares w/o erythema or drainage, oropharynx w/o Erythema/Exudate,  Eyes: Conjunctiva clear, sclera non-icteric Neck: Trachea midline.  No JVD.  Pulmonary:  Good air movement, respirations not labored, no use of accessory muscles.  Cardiac: RRR, normal S1, S2. Vascular: access is more pulsatile with poor thrill left arm Vessel Right Left  Radial Palpable Palpable               Musculoskeletal: M/S 5/5 throughout.  Extremities without ischemic changes.  No deformity or atrophy.  Neurologic: Sensation grossly intact in extremities.  Symmetrical.  Speech is fluent. Motor exam as listed above. Psychiatric: Judgment intact, Mood & affect appropriate for pt's clinical situation. Dermatologic: No rashes or ulcers noted.  No cellulitis or open wounds.    CBC Lab Results  Component Value Date   WBC 10.0 06/29/2017   HGB 11.3 (L) 06/29/2017   HCT 34.8 (L) 06/29/2017  MCV 89.6 06/29/2017   PLT 243 06/29/2017    BMET    Component Value Date/Time   NA 135 06/29/2017 0351   NA 135 (L) 06/16/2014 0915   K 3.9 06/29/2017 0351   K 4.8 06/16/2014 0915   CL 93 (L) 06/29/2017 0351   CL 100 06/16/2014 0915   CO2 26 06/29/2017 0351   CO2 22 06/16/2014 0915   GLUCOSE 169 (H) 06/29/2017 0351   GLUCOSE 226 (H) 06/16/2014 0915   BUN 68 (H) 06/29/2017 0351   BUN 47 (H) 06/16/2014 0915   CREATININE 11.43 (H) 06/29/2017 0351   CREATININE 7.60 (H) 06/16/2014 0915   CALCIUM 9.1 06/29/2017 0351   CALCIUM 7.2 (L) 06/16/2014 0915   GFRNONAA 3 (L) 06/29/2017 0351   GFRNONAA 6 (L) 06/16/2014 0915   GFRNONAA 6 (L) 04/07/2014 0918   GFRAA 3 (L) 06/29/2017 0351   GFRAA 7 (L) 06/16/2014 0915   GFRAA 7 (L) 04/07/2014 0918   CrCl cannot be calculated (Patient's most recent lab result is older than  the maximum 21 days allowed.).  COAG Lab Results  Component Value Date   INR 1.0 06/17/2014   INR 1.2 12/15/2013   INR 1.0 09/01/2011    Radiology No results found.  Assessment/Plan 1.  Complication dialysis device with poor function AV access:  Patient's dialysis access is malfunctioning. The patient will undergo angiography and correction of any problems using interventional techniques with the hope of restoring function to the access.  The risks and benefits were described to the patient.  All questions were answered.  The patient agrees to proceed with angiography and intervention. Potassium will be drawn to ensure that it is an appropriate level prior to performing intervention. 2.  End-stage renal disease requiring hemodialysis:  Patient will continue dialysis therapy without further interruption if a successful intervention is not achieved then a tunneled catheter will be placed. Dialysis has already been arranged. 3.  Hypertension:  Patient will continue medical management; nephrology is following no changes in oral medications. 4. Diabetes mellitus:  Glucose will be monitored and oral medications been held this morning once the patient has undergone the patient's procedure po intake will be reinitiated and again Accu-Cheks will be used to assess the blood glucose level and treat as needed. The patient will be restarted on the patient's usual hypoglycemic regime 5.  Coronary artery disease:  EKG will be monitored. Nitrates will be used if needed. The patient's oral cardiac medications will be continued.    Leotis Pain, MD  09/11/2017 9:39 AM

## 2017-09-11 NOTE — Op Note (Signed)
[facility]    OPERATIVE NOTE   PROCEDURE: 1.  Left brachial artery to axillary vein arteriovenous graft cannulation under ultrasound guidance 2.  Left arm shuntogram 3.  Percutaneous transluminal angioplasty of mid and distal graft and venous anastomosis with 2 inflations with a 7 mm diameter by 10 cm length high-pressure angioplasty balloon  PRE-OPERATIVE DIAGNOSIS: 1. ESRD 2. Malfunctioning left brachial artery to axillary vein arteriovenous graft  POST-OPERATIVE DIAGNOSIS: same as above   SURGEON: Leotis Pain, MD  ANESTHESIA: local with MCS  ESTIMATED BLOOD LOSS: 5 cc  FINDING(S): 1. About an 80% stenosis at the venous anastomosis with collaterals present.  The remainder of the central venous circulation appeared to be patent.  The access sites through the graft throughout the mid and distal portions as well as the arterial access site had diffuse hyperplasia with multiple areas of greater than 50% narrowing.  The tightest area was closer to 85-90% at the arterial access site.   SPECIMEN(S):  None  CONTRAST: 25 cc  FLUORO TIME: 1.5 minutes  MODERATE CONSCIOUS SEDATION TIME:  Approximately 15 minutes using 4 mg of Versed and 100 Mcg of Fentanyl  INDICATIONS: Cynthia Dean is a 67 y.o. female who presents with malfunctioning left brachial artery to axillary vein arteriovenous graft.  The patient is scheduled for left arm shuntogram.  The patient is aware the risks include but are not limited to: bleeding, infection, thrombosis of the cannulated access, and possible anaphylactic reaction to the contrast.  The patient is aware of the risks of the procedure and elects to proceed forward.  DESCRIPTION: After full informed written consent was obtained, the patient was brought back to the angiography suite and placed supine upon the angiography table.  The patient was connected to monitoring equipment. Moderate conscious sedation was administered during  a face to face encounter throughout the procedure with my supervision of the RN administering medicines and monitoring the patient's vital signs, pulse oximetry, telemetry and mental status throughout from the start of the procedure until the patient was taken to the recovery room The left arm was prepped and draped in the standard fashion for a percutaneous access intervention.  Under ultrasound guidance, the left brachial artery to axillary vein arteriovenous graft was cannulated with a micropuncture needle under direct ultrasound guidance and a permanent image was performed.  The microwire was advanced into the graft and the needle was exchanged for the a microsheath.  I then upsized to a 6 Fr Sheath and imaging was performed.  Hand injections were completed to image the access including the central venous system. This demonstrated 80% stenosis at the venous anastomosis with collaterals present.  The remainder of the central venous circulation appeared to be patent.  The access sites through the graft throughout the mid and distal portions as well as the arterial access site had diffuse hyperplasia with multiple areas of greater than 50% narrowing.  The tightest area was closer to 85-90% at the arterial access site.  Based on the images, this patient will need intervention to the multiple areas of stenosis to salvage the graft. I then gave the patient 3000 units of intravenous heparin.  I then crossed the stenoses with a Magic Tourqe wire.  Based on the imaging, a 7 mm x 10 cm high pressure angioplasty balloon was selected.  The balloon was first centered around the venous anastomotic stenosis and inflated to 22 ATM for 1 minute(s).  It was then pulled back into the  mid and distal graft to encompass the diffuse narrowing from the access sites including the tightest stenosis at the arterial access site.  It was then inflated to 16 atm for 1 minute.  On completion imaging, a 25-30 % residual stenosis was present  at the venous anastomosis and the endograft and in-stent stenoses all seem to be less than 20%.  There is also an excellent thrill in the graft at this point.     Based on the completion imaging, no further intervention is necessary.  The wire and balloon were removed from the sheath.  A 4-0 Monocryl purse-string suture was sewn around the sheath.  The sheath was removed while tying down the suture.  A sterile bandage was applied to the puncture site.  COMPLICATIONS: None  CONDITION: Stable   Leotis Pain  09/11/2017 11:14 AM    This note was created with Dragon Medical transcription system. Any errors in dictation are purely unintentional.

## 2017-09-12 ENCOUNTER — Encounter: Payer: Self-pay | Admitting: Vascular Surgery

## 2017-10-27 ENCOUNTER — Other Ambulatory Visit (INDEPENDENT_AMBULATORY_CARE_PROVIDER_SITE_OTHER): Payer: Self-pay | Admitting: Vascular Surgery

## 2017-10-27 DIAGNOSIS — N185 Chronic kidney disease, stage 5: Secondary | ICD-10-CM

## 2017-10-31 ENCOUNTER — Ambulatory Visit (INDEPENDENT_AMBULATORY_CARE_PROVIDER_SITE_OTHER): Payer: Medicare HMO | Admitting: Vascular Surgery

## 2017-10-31 ENCOUNTER — Encounter (INDEPENDENT_AMBULATORY_CARE_PROVIDER_SITE_OTHER): Payer: Medicare HMO

## 2017-11-12 ENCOUNTER — Emergency Department: Payer: Medicare HMO

## 2017-11-12 ENCOUNTER — Observation Stay
Admission: EM | Admit: 2017-11-12 | Discharge: 2017-11-13 | Disposition: A | Discharge status: Home / Self Care | Payer: Medicare HMO | Attending: Internal Medicine | Admitting: Internal Medicine

## 2017-11-12 ENCOUNTER — Observation Stay: Payer: Medicare HMO

## 2017-11-12 ENCOUNTER — Other Ambulatory Visit: Payer: Self-pay

## 2017-11-12 ENCOUNTER — Encounter: Payer: Self-pay | Admitting: Internal Medicine

## 2017-11-12 DIAGNOSIS — Z79899 Other long term (current) drug therapy: Secondary | ICD-10-CM | POA: Insufficient documentation

## 2017-11-12 DIAGNOSIS — E1122 Type 2 diabetes mellitus with diabetic chronic kidney disease: Secondary | ICD-10-CM | POA: Diagnosis not present

## 2017-11-12 DIAGNOSIS — N186 End stage renal disease: Secondary | ICD-10-CM | POA: Diagnosis not present

## 2017-11-12 DIAGNOSIS — Z992 Dependence on renal dialysis: Secondary | ICD-10-CM | POA: Diagnosis not present

## 2017-11-12 DIAGNOSIS — D631 Anemia in chronic kidney disease: Secondary | ICD-10-CM | POA: Diagnosis not present

## 2017-11-12 DIAGNOSIS — Z7982 Long term (current) use of aspirin: Secondary | ICD-10-CM | POA: Insufficient documentation

## 2017-11-12 DIAGNOSIS — R29700 NIHSS score 0: Secondary | ICD-10-CM | POA: Insufficient documentation

## 2017-11-12 DIAGNOSIS — I5032 Chronic diastolic (congestive) heart failure: Secondary | ICD-10-CM | POA: Insufficient documentation

## 2017-11-12 DIAGNOSIS — J449 Chronic obstructive pulmonary disease, unspecified: Secondary | ICD-10-CM | POA: Insufficient documentation

## 2017-11-12 DIAGNOSIS — Z888 Allergy status to other drugs, medicaments and biological substances status: Secondary | ICD-10-CM | POA: Insufficient documentation

## 2017-11-12 DIAGNOSIS — I132 Hypertensive heart and chronic kidney disease with heart failure and with stage 5 chronic kidney disease, or end stage renal disease: Secondary | ICD-10-CM | POA: Diagnosis not present

## 2017-11-12 DIAGNOSIS — E11319 Type 2 diabetes mellitus with unspecified diabetic retinopathy without macular edema: Secondary | ICD-10-CM | POA: Diagnosis not present

## 2017-11-12 DIAGNOSIS — Z794 Long term (current) use of insulin: Secondary | ICD-10-CM | POA: Insufficient documentation

## 2017-11-12 DIAGNOSIS — E785 Hyperlipidemia, unspecified: Secondary | ICD-10-CM | POA: Diagnosis present

## 2017-11-12 DIAGNOSIS — Z87891 Personal history of nicotine dependence: Secondary | ICD-10-CM | POA: Diagnosis not present

## 2017-11-12 DIAGNOSIS — N2581 Secondary hyperparathyroidism of renal origin: Secondary | ICD-10-CM | POA: Insufficient documentation

## 2017-11-12 DIAGNOSIS — I2510 Atherosclerotic heart disease of native coronary artery without angina pectoris: Secondary | ICD-10-CM | POA: Diagnosis not present

## 2017-11-12 DIAGNOSIS — Z8673 Personal history of transient ischemic attack (TIA), and cerebral infarction without residual deficits: Secondary | ICD-10-CM | POA: Diagnosis not present

## 2017-11-12 DIAGNOSIS — I639 Cerebral infarction, unspecified: Secondary | ICD-10-CM | POA: Diagnosis present

## 2017-11-12 DIAGNOSIS — G459 Transient cerebral ischemic attack, unspecified: Secondary | ICD-10-CM | POA: Diagnosis present

## 2017-11-12 DIAGNOSIS — Z7902 Long term (current) use of antithrombotics/antiplatelets: Secondary | ICD-10-CM | POA: Diagnosis not present

## 2017-11-12 DIAGNOSIS — R262 Difficulty in walking, not elsewhere classified: Secondary | ICD-10-CM | POA: Diagnosis not present

## 2017-11-12 DIAGNOSIS — E119 Type 2 diabetes mellitus without complications: Secondary | ICD-10-CM

## 2017-11-12 DIAGNOSIS — I6381 Other cerebral infarction due to occlusion or stenosis of small artery: Secondary | ICD-10-CM | POA: Diagnosis not present

## 2017-11-12 DIAGNOSIS — I10 Essential (primary) hypertension: Secondary | ICD-10-CM | POA: Diagnosis present

## 2017-11-12 HISTORY — DX: End stage renal disease: N18.6

## 2017-11-12 HISTORY — DX: Dependence on renal dialysis: Z99.2

## 2017-11-12 HISTORY — DX: Cerebral infarction, unspecified: I63.9

## 2017-11-12 LAB — COMPREHENSIVE METABOLIC PANEL
ALBUMIN: 3.8 g/dL (ref 3.5–5.0)
ALT: 17 U/L (ref 14–54)
AST: 17 U/L (ref 15–41)
Alkaline Phosphatase: 94 U/L (ref 38–126)
Anion gap: 8 (ref 5–15)
BUN: 42 mg/dL — AB (ref 6–20)
CHLORIDE: 100 mmol/L — AB (ref 101–111)
CO2: 30 mmol/L (ref 22–32)
CREATININE: 8.57 mg/dL — AB (ref 0.44–1.00)
Calcium: 8.4 mg/dL — ABNORMAL LOW (ref 8.9–10.3)
GFR calc Af Amer: 5 mL/min — ABNORMAL LOW (ref >60)
GFR, EST NON AFRICAN AMERICAN: 4 mL/min — AB (ref >60)
GLUCOSE: 71 mg/dL (ref 65–99)
Potassium: 3.6 mmol/L (ref 3.5–5.1)
SODIUM: 138 mmol/L (ref 135–145)
Total Bilirubin: 0.7 mg/dL (ref 0.3–1.2)
Total Protein: 7.2 g/dL (ref 6.5–8.1)

## 2017-11-12 LAB — URINALYSIS, COMPLETE (UACMP) WITH MICROSCOPIC
Bilirubin Urine: NEGATIVE
GLUCOSE, UA: 50 mg/dL — AB
KETONES UR: NEGATIVE mg/dL
NITRITE: NEGATIVE
PH: 8 (ref 5.0–8.0)
Protein, ur: 100 mg/dL — AB
Specific Gravity, Urine: 1.011 (ref 1.005–1.030)

## 2017-11-12 LAB — DIFFERENTIAL
BASOS ABS: 0.1 10*3/uL (ref 0–0.1)
Basophils Relative: 1 %
Eosinophils Absolute: 0.1 10*3/uL (ref 0–0.7)
Eosinophils Relative: 2 %
Lymphocytes Relative: 31 %
Lymphs Abs: 2.1 10*3/uL (ref 1.0–3.6)
MONOS PCT: 11 %
Monocytes Absolute: 0.8 10*3/uL (ref 0.2–0.9)
NEUTROS ABS: 3.7 10*3/uL (ref 1.4–6.5)
NEUTROS PCT: 55 %

## 2017-11-12 LAB — CBC
HEMATOCRIT: 35 % (ref 35.0–47.0)
Hemoglobin: 11.4 g/dL — ABNORMAL LOW (ref 12.0–16.0)
MCH: 28.4 pg (ref 26.0–34.0)
MCHC: 32.7 g/dL (ref 32.0–36.0)
MCV: 86.9 fL (ref 80.0–100.0)
Platelets: 195 10*3/uL (ref 150–440)
RBC: 4.03 MIL/uL (ref 3.80–5.20)
RDW: 15.5 % — ABNORMAL HIGH (ref 11.5–14.5)
WBC: 6.8 10*3/uL (ref 3.6–11.0)

## 2017-11-12 LAB — PROTIME-INR
INR: 0.87
Prothrombin Time: 11.8 seconds (ref 11.4–15.2)

## 2017-11-12 LAB — APTT: APTT: 28 s (ref 24–36)

## 2017-11-12 LAB — GLUCOSE, CAPILLARY: Glucose-Capillary: 145 mg/dL — ABNORMAL HIGH (ref 65–99)

## 2017-11-12 LAB — TROPONIN I: Troponin I: <0.03 ng/mL (ref <0.03)

## 2017-11-12 MED ORDER — HEPARIN SODIUM (PORCINE) 5000 UNIT/ML IJ SOLN
5000.0000 [IU] | Freq: Three times a day (TID) | INTRAMUSCULAR | Status: DC
  Administered 2017-11-12 – 2017-11-13 (×2): 5000 [IU] via SUBCUTANEOUS
  Filled 2017-11-12 (×2): qty 1

## 2017-11-12 MED ORDER — SEVELAMER CARBONATE 800 MG PO TABS
2400.0000 mg | ORAL_TABLET | Freq: Three times a day (TID) | ORAL | Status: DC
  Administered 2017-11-13 (×2): 2400 mg via ORAL
  Filled 2017-11-12 (×3): qty 3

## 2017-11-12 MED ORDER — ACETAMINOPHEN 160 MG/5ML PO SOLN: 650.0000 mg | ORAL | Status: DC | PRN

## 2017-11-12 MED ORDER — CLOPIDOGREL BISULFATE 75 MG PO TABS
75.0000 mg | ORAL_TABLET | Freq: Every day | ORAL | Status: DC
  Administered 2017-11-13: 08:00:00 75 mg via ORAL
  Filled 2017-11-12: qty 1

## 2017-11-12 MED ORDER — INSULIN ASPART 100 UNIT/ML ~~LOC~~ SOLN: 0.0000 [IU] | Freq: Every day | SUBCUTANEOUS | Status: DC

## 2017-11-12 MED ORDER — PANTOPRAZOLE SODIUM 40 MG PO TBEC
40.0000 mg | DELAYED_RELEASE_TABLET | Freq: Every day | ORAL | Status: DC
  Administered 2017-11-13: 08:00:00 40 mg via ORAL
  Filled 2017-11-12: qty 1

## 2017-11-12 MED ORDER — INSULIN ASPART 100 UNIT/ML ~~LOC~~ SOLN
0.0000 [IU] | Freq: Three times a day (TID) | SUBCUTANEOUS | Status: DC
  Administered 2017-11-13 (×2): 2 [IU] via SUBCUTANEOUS
  Filled 2017-11-12 (×2): qty 1

## 2017-11-12 MED ORDER — CINACALCET HCL 30 MG PO TABS
60.0000 mg | ORAL_TABLET | Freq: Every day | ORAL | Status: DC
  Administered 2017-11-13: 60 mg via ORAL
  Filled 2017-11-12: qty 2

## 2017-11-12 MED ORDER — ASPIRIN 81 MG PO CHEW
324.0000 mg | CHEWABLE_TABLET | Freq: Once | ORAL | Status: AC
  Administered 2017-11-12: 324 mg via ORAL
  Filled 2017-11-12: qty 4

## 2017-11-12 MED ORDER — ACETAMINOPHEN 650 MG RE SUPP: 650.0000 mg | RECTAL | Status: DC | PRN

## 2017-11-12 MED ORDER — STROKE: EARLY STAGES OF RECOVERY BOOK
Freq: Once | Status: AC
  Administered 2017-11-12: 23:00:00

## 2017-11-12 MED ORDER — ACETAMINOPHEN 325 MG PO TABS: 650.0000 mg | ORAL_TABLET | ORAL | Status: DC | PRN

## 2017-11-12 MED ORDER — ATORVASTATIN CALCIUM 20 MG PO TABS
80.0000 mg | ORAL_TABLET | Freq: Every evening | ORAL | Status: DC
  Administered 2017-11-12: 80 mg via ORAL
  Filled 2017-11-12: qty 4

## 2017-11-12 MED ORDER — ASPIRIN 81 MG PO CHEW
81.0000 mg | CHEWABLE_TABLET | Freq: Every day | ORAL | Status: DC
  Administered 2017-11-13: 08:00:00 81 mg via ORAL
  Filled 2017-11-12: qty 1

## 2017-11-12 NOTE — ED Provider Notes (Signed)
[facility] Emergency Department Provider Note    First MD Initiated Contact with Patient 11/12/17 1618     (approximate)  I have reviewed the triage vital signs and the nursing notes.   HISTORY  Chief Complaint Aphasia    HPI Xia Lizmary Cynthia Dean is a 67 y.o. 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  States that last night she was not having any of the symptoms.  Denies any weakness.  No chest pain or shortness of breath.  No recent fevers.  No blurry vision or dizziness.  States that her previous stroke was similar but not quite as severe.  States that the symptoms lasted several hours today and is significantly improved after arrival to the ER.  Fianc at bedside states that the slurred speech has improved but she does still have some deformity to her right mouth that he has not noted before.  Patient states that she feels otherwise "better ".  Reportedly they have been compliant with her medications.  Past Medical History:  Diagnosis Date  . Chronic kidney disease   . COPD (chronic obstructive pulmonary disease) ([place])   . Coronary artery disease   . Diabetes mellitus without complication ([place])   . Heart murmur   . Hyperlipidemia   . Hypertension    Family History  Problem Relation Age of Onset  . Breast cancer Father 31  . Stroke Mother   . Heart attack Mother   . Heart Problems Sister   . Breast cancer Cousin 48       1 st cousin. Maternal    Past Surgical History:  Procedure Laterality Date  . A/V FISTULAGRAM Left 12/20/2016   Procedure: A/V Fistulagram;  Surgeon: Katha Cabal, MD;  Location: [place] [facility];  Service: Cardiovascular;  Laterality: Left;  . A/V SHUNT INTERVENTION N/A 12/20/2016   Procedure: A/V Shunt Intervention;  Surgeon: Katha Cabal, MD;  Location: [place] [facility];  Service: Cardiovascular;  Laterality: N/A;  . A/V SHUNTOGRAM Left 09/11/2017   Procedure: A/V SHUNTOGRAM;  Surgeon: Algernon Huxley, MD;  Location: [place] [facility];  Service: Cardiovascular;  Laterality: Left;  . BREAST BIOPSY Bilateral 07/19/00   neg  . PERIPHERAL VASCULAR CATHETERIZATION Left 02/02/2015   Procedure: A/V Shuntogram/Fistulagram;  Surgeon: Algernon Huxley, MD;  Location: [place] [facility];  Service: Cardiovascular;  Laterality: Left;  . PERIPHERAL VASCULAR CATHETERIZATION Left 02/02/2015   Procedure: A/V Shunt Intervention;  Surgeon: Algernon Huxley, MD;  Location: [place] [facility];  Service: Cardiovascular;  Laterality: Left;  . PERIPHERAL VASCULAR CATHETERIZATION Left 03/09/2015   Procedure: A/V Shuntogram/Fistulagram;  Surgeon: Algernon Huxley, MD;  Location: [place] [facility];  Service: Cardiovascular;  Laterality: Left;  . PERIPHERAL VASCULAR CATHETERIZATION N/A 03/09/2015   Procedure: A/V Shunt Intervention;  Surgeon: Algernon Huxley, MD;  Location: [place] [facility];  Service: Cardiovascular;  Laterality: N/A;  . PERIPHERAL VASCULAR CATHETERIZATION Left 04/17/2015   Procedure: Upper Extremity Angiography;  Surgeon: Algernon Huxley, MD;  Location: [place] [facility];  Service: Cardiovascular;  Laterality: Left;  . PERIPHERAL VASCULAR CATHETERIZATION  04/17/2015   Procedure: Upper Extremity Intervention;  Surgeon: Algernon Huxley, MD;  Location: [place] [facility];  Service: Cardiovascular;;  . PERIPHERAL VASCULAR CATHETERIZATION N/A 08/10/2015   Procedure: A/V Shuntogram/Fistulagram;  Surgeon: Corene Cornea  Bunnie Domino, MD;  Location: [place] [facility];  Service: Cardiovascular;  Laterality: N/A;  . PERIPHERAL VASCULAR CATHETERIZATION N/A 08/10/2015   Procedure: A/V Shunt Intervention;  Surgeon: Algernon Huxley, MD;  Location: [place] [facility];  Service: Cardiovascular;  Laterality: N/A;  . PERIPHERAL VASCULAR CATHETERIZATION Left 04/11/2016   Procedure: A/V Shuntogram/Fistulagram;  Surgeon:  Algernon Huxley, MD;  Location: [place] [facility];  Service: Cardiovascular;  Laterality: Left;   Patient Active Problem List   Diagnosis Date Noted  . Hematemesis 06/27/2017  . Stroke (cerebrum) ([place]) 04/14/2016  . Essential hypertension, benign 11/27/2015  . Hyperlipidemia 11/27/2015  . Obesity 11/27/2015  . Prolonged Q-T interval on ECG 11/26/2015  . Intractable nausea and vomiting 08/08/2015      Prior to Admission medications   Medication Sig Start Date End Date Taking? Authorizing Provider  albuterol-ipratropium (COMBIVENT) 18-103 MCG/ACT inhaler Inhale 2 puffs into the lungs 2 (two) times daily.    [provider]  amoxicillin-clavulanate (AUGMENTIN) 500-125 MG tablet Take 1 tablet (500 mg total) by mouth at bedtime. Patient not taking: Reported on 09/11/2017 07/01/17   Loletha Grayer, MD  aspirin 81 MG tablet Take 81 mg by mouth daily.    [provider]  atorvastatin (LIPITOR) 80 MG tablet Take 80 mg by mouth every evening.     [provider]  budesonide-formoterol (SYMBICORT) 160-4.5 MCG/ACT inhaler Inhale 2 puffs into the lungs 2 (two) times daily. 07/01/17   Loletha Grayer, MD  cinacalcet (SENSIPAR) 60 MG tablet Take 60 mg by mouth daily.    [provider]  clopidogrel (PLAVIX) 75 MG tablet TAKE ONE TABLET BY MOUTH ONCE DAILY 06/14/16   Stegmayer, Joelene Millin A, PA-C  gabapentin (NEURONTIN) 100 MG capsule Take 100 mg by mouth 3 (three) times daily.    [provider]  insulin NPH-regular Human (NOVOLIN 70/30) (70-30) 100 UNIT/ML injection Inject 40 Units into the skin 2 (two) times daily with a meal. Except dialysis days. Do not take on Tuesdays, Thursdays, and Saturdays.    [provider]  ipratropium-albuterol (DUONEB) 0.5-2.5 (3) MG/3ML SOLN Take 3 mLs by nebulization every 6 (six) hours as needed (shortness of breath/ wheezing).     [provider]  irbesartan (AVAPRO) 150 MG tablet Take 1 tablet by mouth  daily. 04/20/17   [provider]  lidocaine-prilocaine (EMLA) cream Apply 1 application topically as needed (port access).     [provider]  metoprolol succinate (TOPROL-XL) 100 MG 24 hr tablet Take 100 mg by mouth every evening. Take with or immediately following a meal.     [provider]  Nutritional Supplements (FEEDING SUPPLEMENT, NEPRO CARB STEADY,) LIQD Take 237 mLs by mouth 2 (two) times daily between meals. 07/01/17   Loletha Grayer, MD  omeprazole (PRILOSEC) 20 MG capsule Take 20 mg by mouth 2 (two) times daily.     [provider]  pregabalin (LYRICA) 50 MG capsule Take 50 mg by mouth 2 (two) times daily.    [provider]  sevelamer carbonate (RENVELA) 800 MG tablet Take 2,400 mg by mouth 3 (three) times daily with meals.     [provider]  valsartan (DIOVAN) 160 MG tablet Take 160 mg by mouth every evening.     [provider]    Allergies Tape    Social History Social History   Tobacco Use  . Smoking status: Former Smoker    Packs/day: 0.00    Types: Cigarettes  .  Smokeless tobacco: Never Used  Substance Use Topics  . Alcohol use: No  . Drug use: No    Review of Systems Patient denies headaches, rhinorrhea, blurry vision, numbness, shortness of breath, chest pain, edema, cough, abdominal pain, nausea, vomiting, diarrhea, dysuria, fevers, rashes or hallucinations unless otherwise stated above in HPI. ____________________________________________   PHYSICAL EXAM:  VITAL SIGNS: Vitals:   11/12/17 1242  BP: (!) 169/90  Pulse: 63  Resp: 16  Temp: 97.6 F (36.4 C)  SpO2: 95%    Constitutional: Alert and oriented. Well appearing and in no acute distress. Eyes: Conjunctivae are normal.  Head: Atraumatic. Nose: No congestion/rhinnorhea. Mouth/Throat: Mucous membranes are moist.   Neck: No stridor. Painless ROM.  Cardiovascular: Normal rate, regular rhythm. Grossly normal heart sounds.  Good  peripheral circulation. Respiratory: Normal respiratory effort.  No retractions. Lungs CTAB. Gastrointestinal: Soft and nontender. No distention. No abdominal bruits. No CVA tenderness. Genitourinary:  Musculoskeletal: No lower extremity tenderness nor edema.  No joint effusions. Neurologic:  CN- intact. ? Possible subtle trace right facial droop versus patient's baseline.  Normal FNF.  Normal heel to shin.  Sensation intact bilaterally. Normal speech and language. No gross focal neurologic deficits are appreciated. No gait instability. Skin:  Skin is warm, dry and intact. No rash noted. Psychiatric: Mood and affect are normal. Speech and behavior are normal.  ____________________________________________   LABS (all labs ordered are listed, but only abnormal results are displayed)  Results for orders placed or performed during the hospital encounter of 11/12/17 (from the past 24 hour(s))  Protime-INR     Status: None   Collection Time: 11/12/17 12:50 PM  Result Value Ref Range   Prothrombin Time 11.8 11.4 - 15.2 seconds   INR 0.87   APTT     Status: None   Collection Time: 11/12/17 12:50 PM  Result Value Ref Range   aPTT 28 24 - 36 seconds  CBC     Status: Abnormal   Collection Time: 11/12/17 12:50 PM  Result Value Ref Range   WBC 6.8 3.6 - 11.0 K/uL   RBC 4.03 3.80 - 5.20 MIL/uL   Hemoglobin 11.4 (L) 12.0 - 16.0 g/dL   HCT 35.0 35.0 - 47.0 %   MCV 86.9 80.0 - 100.0 fL   MCH 28.4 26.0 - 34.0 pg   MCHC 32.7 32.0 - 36.0 g/dL   RDW 15.5 (H) 11.5 - 14.5 %   Platelets 195 150 - 440 K/uL  Differential     Status: None   Collection Time: 11/12/17 12:50 PM  Result Value Ref Range   Neutrophils Relative % 55 %   Neutro Abs 3.7 1.4 - 6.5 K/uL   Lymphocytes Relative 31 %   Lymphs Abs 2.1 1.0 - 3.6 K/uL   Monocytes Relative 11 %   Monocytes Absolute 0.8 0.2 - 0.9 K/uL   Eosinophils Relative 2 %   Eosinophils Absolute 0.1 0 - 0.7 K/uL   Basophils Relative 1 %   Basophils Absolute  0.1 0 - 0.1 K/uL  Comprehensive metabolic panel     Status: Abnormal   Collection Time: 11/12/17 12:50 PM  Result Value Ref Range   Sodium 138 135 - 145 mmol/L   Potassium 3.6 3.5 - 5.1 mmol/L   Chloride 100 (L) 101 - 111 mmol/L   CO2 30 22 - 32 mmol/L   Glucose, Bld 71 65 - 99 mg/dL   BUN 42 (H) 6 - 20 mg/dL   Creatinine, Ser 8.57 (H) 0.44 -  1.00 mg/dL   Calcium 8.4 (L) 8.9 - 10.3 mg/dL   Total Protein 7.2 6.5 - 8.1 g/dL   Albumin 3.8 3.5 - 5.0 g/dL   AST 17 15 - 41 U/L   ALT 17 14 - 54 U/L   Alkaline Phosphatase 94 38 - 126 U/L   Total Bilirubin 0.7 0.3 - 1.2 mg/dL   GFR calc non Af Amer 4 (L) >60 mL/min   GFR calc Af Amer 5 (L) >60 mL/min   Anion gap 8 5 - 15  Troponin I     Status: None   Collection Time: 11/12/17 12:50 PM  Result Value Ref Range   Troponin I <0.03 <0.03 ng/mL   ____________________________________________  EKG My review and personal interpretation at Time: 13:44   Indication: slurred speech  Rate: 13:44  Rhythm: sinus Axis: normal Other: borderline prolonged qt, no stemi ____________________________________________  RADIOLOGY  I personally reviewed all radiographic images ordered to evaluate for the above acute complaints and reviewed radiology reports and findings.  These findings were personally discussed with the patient.  Please see medical record for radiology report.  ____________________________________________   PROCEDURES  Procedure(s) performed:  Procedures    Critical Care performed: no ____________________________________________   INITIAL IMPRESSION / ASSESSMENT AND PLAN / ED COURSE  Pertinent labs & imaging results that were available during my care of the patient were reviewed by me and considered in my medical decision making (see chart for details).  DDX: cva, tia, hypoglycemia, dehydration, electrolyte abnormality, dissection, sepsis   Nomi Jetaime Pinnix is a 67 y.o. who presents to the ED with symptoms described  above.  Most his symptoms seem to have artery resolved making this more consistent with TIA and patient is already on aspirin and Plavix.  No evidence of A. fib.  Symptoms seem to be very mild at this time.  Will order MRI to further characterize evaluate if there is any true acute or subacute infarct causing the symptoms patient's family members are concerned that she does still have some persistent deficit.  Not clinically consistent with dissection.  Doubt infectious process.  The patient will be placed on continuous pulse oximetry and telemetry for monitoring.  Laboratory evaluation will be sent to evaluate for the above complaints.      Clinical Course as of Nov 12 2120  Nancy Fetter Nov 12, 2017  2027 Patient has been evaluated by tele-neurology who does feel patient requires inpatient admission for echo MRA and further evaluation for thalamic stroke.   [PR]    Clinical Course User Index [PR] Merlyn Lot, MD     As part of my medical decision making, I reviewed the following data within the [place] notes reviewed and incorporated, Labs reviewed, notes from prior ED visits and [place] Controlled Substance Database   ____________________________________________   FINAL CLINICAL IMPRESSION(S) / ED DIAGNOSES  Final diagnoses:  Cerebrovascular accident (CVA), unspecified mechanism ([place])  TIA (transient ischemic attack)      NEW MEDICATIONS STARTED DURING THIS VISIT:  New Prescriptions   No medications on file     Note:  This document was prepared using Dragon voice recognition software and may include unintentional dictation errors.Merlyn Lot, MD 11/12/17 2122

## 2017-11-12 NOTE — ED Notes (Signed)
ED Provider at bedside. 

## 2017-11-12 NOTE — ED Notes (Signed)
Floor unable to take report at this time.

## 2017-11-12 NOTE — ED Triage Notes (Addendum)
Pt reports that this am around 0800, she noticed that her speech seemed slurred to her and that when she drank something it felt as if it would come out the rt side of her mouth, pt has a hx of stroke pt is on dialysis, pt's family member states that he sees nothing different, pt states that her daughter noticed it though. Pt denies numbness or difference in sensation to her face, pt has no deviation of tongue with protrusion. Pt denies difficulty with eating breakfast this am. Pt has equal hand grips and equal ability to hold arms in air. No noticeable slurring of speech by this RN, pt reports feeling hungry

## 2017-11-12 NOTE — H&P (Signed)
[facility] NAME: Cynthia Dean    MR#:  [id number]  DATE OF BIRTH:  [birth year]  DATE OF ADMISSION:  11/12/2017  PRIMARY CARE PHYSICIAN: Lowella Bandy, MD   REQUESTING/REFERRING PHYSICIAN: Quentin Cornwall, MD  CHIEF COMPLAINT:   Chief Complaint  Patient presents with  . Aphasia    HISTORY OF PRESENT ILLNESS:  Cynthia Dean  is a 67 y.o. female who presents with an episode of dysarthria and right facial weakness.  Patient states that when she woke up this morning she noticed the symptoms.  She had some slurred speech, and when she would drink she noticed that she would dribble some from the right side of her mouth.  She went to church and came back around lunchtime, and at that time noticed that her symptoms were resolving.  She came to the ED for evaluation after that.  Here she was found to have acute punctate stroke of her thalamus.  Her symptoms have already mostly resolved.  Imaging also shows 2 areas of previous stroke.  Hospitalist were called for admission and further evaluation  PAST MEDICAL HISTORY:   Past Medical History:  Diagnosis Date  . COPD (chronic obstructive pulmonary disease) ([place])   . Coronary artery disease   . Diabetes mellitus without complication ([place])   . ESRD on dialysis ([place])   . Heart murmur   . Hyperlipidemia   . Hypertension   . Stroke [facility])      PAST SURGICAL HISTORY:   Past Surgical History:  Procedure Laterality Date  . A/V FISTULAGRAM Left 12/20/2016   Procedure: A/V Fistulagram;  Surgeon: Katha Cabal, MD;  Location: [place] CV LAB;  Service: Cardiovascular;  Laterality: Left;  . A/V SHUNT INTERVENTION N/A 12/20/2016   Procedure: A/V Shunt Intervention;  Surgeon: Katha Cabal, MD;  Location: [place] CV LAB;  Service: Cardiovascular;  Laterality: N/A;  . A/V SHUNTOGRAM Left 09/11/2017   Procedure: A/V SHUNTOGRAM;  Surgeon: Algernon Huxley, MD;  Location: [place] CV LAB;  Service: Cardiovascular;  Laterality: Left;  . BREAST BIOPSY Bilateral 07/19/00   neg  . PERIPHERAL VASCULAR CATHETERIZATION Left 02/02/2015   Procedure: A/V Shuntogram/Fistulagram;  Surgeon: Algernon Huxley, MD;  Location: [place] CV LAB;  Service: Cardiovascular;  Laterality: Left;  . PERIPHERAL VASCULAR CATHETERIZATION Left 02/02/2015   Procedure: A/V Shunt Intervention;  Surgeon: Algernon Huxley, MD;  Location: [place] CV LAB;  Service: Cardiovascular;  Laterality: Left;  . PERIPHERAL VASCULAR CATHETERIZATION Left 03/09/2015   Procedure: A/V Shuntogram/Fistulagram;  Surgeon: Algernon Huxley, MD;  Location: [place] CV LAB;  Service: Cardiovascular;  Laterality: Left;  . PERIPHERAL VASCULAR CATHETERIZATION N/A 03/09/2015   Procedure: A/V Shunt Intervention;  Surgeon: Algernon Huxley, MD;  Location: [place] CV LAB;  Service: Cardiovascular;  Laterality: N/A;  . PERIPHERAL VASCULAR CATHETERIZATION Left 04/17/2015   Procedure: Upper Extremity Angiography;  Surgeon: Algernon Huxley, MD;  Location: [place] CV LAB;  Service: Cardiovascular;  Laterality: Left;  . PERIPHERAL VASCULAR CATHETERIZATION  04/17/2015   Procedure: Upper Extremity Intervention;  Surgeon: Algernon Huxley, MD;  Location: [place] CV LAB;  Service: Cardiovascular;;  . PERIPHERAL VASCULAR CATHETERIZATION N/A 08/10/2015   Procedure: A/V Shuntogram/Fistulagram;  Surgeon: Algernon Huxley, MD;  Location: [place] CV LAB;  Service: Cardiovascular;  Laterality: N/A;  . PERIPHERAL VASCULAR CATHETERIZATION N/A 08/10/2015   Procedure: A/V Shunt Intervention;  Surgeon: Algernon Huxley, MD;  Location: [place] CV LAB;  Service: Cardiovascular;  Laterality: N/A;  . PERIPHERAL VASCULAR CATHETERIZATION Left 04/11/2016   Procedure: A/V Shuntogram/Fistulagram;  Surgeon: Algernon Huxley, MD;  Location: [place] CV LAB;  Service: Cardiovascular;  Laterality: Left;     SOCIAL HISTORY:   Social History   Tobacco Use  .  Smoking status: Former Smoker    Packs/day: 0.00    Types: Cigarettes  . Smokeless tobacco: Never Used  Substance Use Topics  . Alcohol use: No     FAMILY HISTORY:   Family History  Problem Relation Age of Onset  . Breast cancer Father 17  . Stroke Mother   . Heart attack Mother   . Heart Problems Sister   . Breast cancer Cousin 24       1 st cousin. Maternal      DRUG ALLERGIES:   Allergies  Allergen Reactions  . Tape Itching    Skin Dermatitis/itching (tape adhesive)    MEDICATIONS AT HOME:   Prior to Admission medications   Medication Sig Start Date End Date Taking? Authorizing Provider  albuterol-ipratropium (COMBIVENT) 18-103 MCG/ACT inhaler Inhale 2 puffs into the lungs 2 (two) times daily.   Yes [provider]  aspirin 81 MG tablet Take 81 mg by mouth daily.   Yes [provider]  atorvastatin (LIPITOR) 80 MG tablet Take 80 mg by mouth every evening.    Yes [provider]  cloNIDine (CATAPRES - DOSED IN MG/24 HR) 0.2 mg/24hr patch Place 1 patch onto the skin once a week. 10/09/17  Yes [provider]  clopidogrel (PLAVIX) 75 MG tablet TAKE ONE TABLET BY MOUTH ONCE DAILY 06/14/16  Yes Stegmayer, Joelene Millin A, PA-C  gabapentin (NEURONTIN) 100 MG capsule Take 100 mg by mouth 3 (three) times daily.   Yes [provider]  insulin NPH-regular Human (NOVOLIN 70/30) (70-30) 100 UNIT/ML injection Inject 40 Units into the skin 2 (two) times daily with a meal. Except dialysis days. Do not take on Tuesdays, Thursdays, and Saturdays.   Yes [provider]  ipratropium-albuterol (DUONEB) 0.5-2.5 (3) MG/3ML SOLN Take 3 mLs by nebulization every 6 (six) hours as needed (shortness of breath/ wheezing).    Yes [provider]  lidocaine-prilocaine (EMLA) cream Apply 1 application topically as needed (port access).    Yes [provider]  losartan (COZAAR) 100 MG tablet Take 100 mg by mouth daily. 09/12/17  Yes  [provider]  metoprolol succinate (TOPROL-XL) 100 MG 24 hr tablet Take 100 mg by mouth every evening. Take with or immediately following a meal.    Yes [provider]  Nutritional Supplements (FEEDING SUPPLEMENT, NEPRO CARB STEADY,) LIQD Take 237 mLs by mouth 2 (two) times daily between meals. 07/01/17  Yes Wieting, Richard, MD  omeprazole (PRILOSEC) 20 MG capsule Take 20 mg by mouth 2 (two) times daily.    Yes [provider]  sevelamer carbonate (RENVELA) 800 MG tablet Take 2,400 mg by mouth 3 (three) times daily with meals.    Yes [provider]  torsemide (DEMADEX) 100 MG tablet Take 100 mg by mouth daily. 10/10/17  Yes [provider]  budesonide-formoterol (SYMBICORT) 160-4.5 MCG/ACT inhaler Inhale 2 puffs into the lungs 2 (two) times daily. Patient not taking: Reported on 11/12/2017 07/01/17   Loletha Grayer, MD  cinacalcet (SENSIPAR) 60 MG tablet Take 60 mg by mouth daily.    [provider]    REVIEW OF SYSTEMS:  Review of Systems  Constitutional: Negative for chills, fever, malaise/fatigue and weight loss.  HENT: Negative for ear pain, hearing loss and tinnitus.   Eyes: Negative for blurred vision, double vision, pain and redness.  Respiratory: Negative for cough, hemoptysis and shortness of breath.   Cardiovascular: Negative for chest pain, palpitations, orthopnea and leg swelling.  Gastrointestinal: Negative for abdominal pain, constipation, diarrhea, nausea and vomiting.  Genitourinary: Negative for dysuria, frequency and hematuria.  Musculoskeletal: Negative for back pain, joint pain and neck pain.  Skin:       No acne, rash, or lesions  Neurological: Positive for speech change and focal weakness. Negative for dizziness, tremors and weakness.  Endo/Heme/Allergies: Negative for polydipsia. Does not bruise/bleed easily.  Psychiatric/Behavioral: Negative for depression. The patient is not nervous/anxious and does not have  insomnia.      VITAL SIGNS:   Vitals:   11/12/17 1900 11/12/17 1908 11/12/17 1930 11/12/17 1945  BP: (!) 176/65  (!) 179/70 (!) 167/61  Pulse: (!) 55  (!) 56 (!) 55  Resp: 13  20 16   Temp:  (!) 97.5 F (36.4 C)    TempSrc:      SpO2: 100%  99% 96%  Weight:      Height:       Wt Readings from Last 3 Encounters:  11/12/17 97.5 kg (215 lb)  09/11/17 115.2 kg (254 lb)  06/30/17 124.7 kg (274 lb 14.6 oz)    PHYSICAL EXAMINATION:  Physical Exam  Vitals reviewed. Constitutional: She is oriented to person, place, and time. She appears well-developed and well-nourished. No distress.  HENT:  Head: Normocephalic and atraumatic.  Mouth/Throat: Oropharynx is clear and moist.  Eyes: Pupils are equal, round, and reactive to light. Conjunctivae and EOM are normal. No scleral icterus.  Neck: Normal range of motion. Neck supple. No JVD present. No thyromegaly present.  Cardiovascular: Normal rate, regular rhythm and intact distal pulses. Exam reveals no gallop and no friction rub.  No murmur heard. Respiratory: Effort normal and breath sounds normal. No respiratory distress. She has no wheezes. She has no rales.  GI: Soft. Bowel sounds are normal. She exhibits no distension. There is no tenderness.  Musculoskeletal: Normal range of motion. She exhibits no edema.  No arthritis, no gout  Lymphadenopathy:    She has no cervical adenopathy.  Neurological: She is alert and oriented to person, place, and time. No cranial nerve deficit.  Neurologic: Cranial nerves II-XII intact, Sensation intact to light touch/pinprick, 5/5 strength in all extremities, no dysarthria, no aphasia, no dysphagia, memory intact, finger to nose testing showed no abnormality, no pronator drift  Skin: Skin is warm and dry. No rash noted. No erythema.  Psychiatric: She has a normal mood and affect. Her behavior is normal. Judgment and thought content normal.    LABORATORY PANEL:   CBC Recent Labs  Lab 11/12/17 1250   WBC 6.8  HGB 11.4*  HCT 35.0  PLT 195   ------------------------------------------------------------------------------------------------------------------  Chemistries  Recent Labs  Lab 11/12/17 1250  NA 138  K 3.6  CL 100*  CO2 30  GLUCOSE 71  BUN 42*  CREATININE 8.57*  CALCIUM 8.4*  AST 17  ALT 17  ALKPHOS 94  BILITOT 0.7   ------------------------------------------------------------------------------------------------------------------  Cardiac Enzymes Recent Labs  Lab 11/12/17 1250  TROPONINI <0.03   ------------------------------------------------------------------------------------------------------------------  RADIOLOGY:  Ct Head Wo Contrast  Result Date: 11/12/2017 CLINICAL DATA:  Onset of slurred speech this morning. EXAM: CT HEAD WITHOUT CONTRAST TECHNIQUE: Contiguous axial images were obtained from  the base of the skull through the vertex without intravenous contrast. COMPARISON:  Brain MRI and head CT scan 06/27/2017. FINDINGS: Brain: No evidence of acute infarction, hemorrhage, hydrocephalus, extra-axial collection or mass lesion/mass effect. Right frontal and left parietal infarcts are unchanged. There is some atrophy and chronic microvascular ischemic disease. Vascular: Extensive atherosclerosis. Skull: Intact. Sinuses/Orbits: Negative. Other: None. IMPRESSION: No acute abnormality. Atrophy, chronic microvascular ischemic change and remote right frontal and left parietal infarcts, unchanged. Atherosclerosis. Electronically Signed   By: Inge Rise M.D.   On: 11/12/2017 13:22   Mr Brain Wo Contrast  Result Date: 11/12/2017 CLINICAL DATA:  Slurred speech and facial droop. History of stroke, end-stage renal disease on dialysis, hypertension, hyperlipidemia, diabetes. EXAM: MRI HEAD WITHOUT CONTRAST TECHNIQUE: Multiplanar, multiecho pulse sequences of the brain and surrounding structures were obtained without intravenous contrast. COMPARISON:  CT HEAD November 12, 2017 and MRI of the head June 27, 2017. FINDINGS: Mild motion degraded examination. INTRACRANIAL CONTENTS: Punctate reduced diffusion RIGHT thalamus, too small to localized on ADC map. No susceptibility artifact to suggest hemorrhage. Minimal susceptibility artifact associated with RIGHT frontal lobe encephalomalacia. LEFT temporoparietal lobe encephalomalacia. Old bilateral cerebellar infarcts. Old LEFT basal ganglia lacunar infarct with mild ex vacuo dilatation subjacent ventricle. Moderate parenchymal brain volume loss. No hydrocephalus. Patchy supratentorial and pontine white matter FLAIR T2 hyperintensities exclusive of the aforementioned abnormality. No midline shift, mass effect or masses. No abnormal extra-axial fluid collections. VASCULAR: Normal major intracranial vascular flow voids present at skull base. SKULL AND UPPER CERVICAL SPINE: No abnormal sellar expansion. No suspicious calvarial bone marrow signal. Craniocervical junction maintained. SINUSES/ORBITS: The mastoid air-cells and included paranasal sinuses are well-aerated.The included ocular globes and orbital contents are non-suspicious. OTHER: None. IMPRESSION: 1. Motion degraded examination. Punctate acute/subacute RIGHT thalamus infarct versus artifact. 2. Old RIGHT frontal (MCA territory) and LEFT temporoparietal (MCA territory) infarcts. 3. Moderate chronic small vessel ischemic disease. Old LEFT basal ganglia and cerebellar small infarcts. 4. Moderate parenchymal brain volume loss. Electronically Signed   By: Elon Alas M.D.   On: 11/12/2017 18:26    EKG:   Orders placed or performed during the hospital encounter of 11/12/17  . ED EKG  . ED EKG    IMPRESSION AND PLAN:  Principal Problem:   Stroke [facility]) -symptoms seem to have resolved at this time, admit per stroke admission order set with appropriate imaging, labs, consults Active Problems:   Essential hypertension, benign -hold home antihypertensives for now for  24 hours permissive hypertension, blood pressure goal less than 220/120   Diabetes ([place]) -sliding scale insulin with corresponding glucose checks   CAD (coronary artery disease) -continue home medications   COPD (chronic obstructive pulmonary disease) ([place]) -home dose inhalers   ESRD on dialysis [facility]) -nephrology consult for dialysis   Hyperlipidemia -Home dose antilipid  Chart review performed and case discussed with ED provider. Labs, imaging and/or ECG reviewed by provider and discussed with patient/family. Management plans discussed with the patient and/or family.  DVT PROPHYLAXIS: SubQ heparin  GI PROPHYLAXIS: PPI  ADMISSION STATUS: Observation  CODE STATUS: Full Code Status History    Date Active Date Inactive Code Status Order ID Comments User Context   04/14/2016 1330 04/14/2016 1700 Full Code [id number]  Bettey Costa, MD ED   08/08/2015 2257 08/09/2015 2054 Full Code [id number]  Hower, Aaron Mose, MD ED   04/17/2015 1334 04/17/2015 1836 Full Code [id number]  Dew, Erskine Squibb, MD Inpatient      TOTAL TIME TAKING CARE OF  THIS PATIENT: 40 minutes.   Natividad Halls Bigelow 11/12/2017, 8:48 PM  [facility]  Office  [phone]  CC: Primary care physician; Lowella Bandy, MD  Note:  This document was prepared using Dragon voice recognition software and may include unintentional dictation errors.

## 2017-11-12 NOTE — ED Notes (Signed)
teleneuro on.

## 2017-11-12 NOTE — ED Notes (Signed)
Pt to MRI by Alissa, EDT and then to floor. 1C verbalizes OK

## 2017-11-13 ENCOUNTER — Observation Stay: Payer: Medicare HMO

## 2017-11-13 ENCOUNTER — Other Ambulatory Visit: Payer: Self-pay

## 2017-11-13 DIAGNOSIS — G459 Transient cerebral ischemic attack, unspecified: Secondary | ICD-10-CM | POA: Diagnosis not present

## 2017-11-13 LAB — GLUCOSE, CAPILLARY
GLUCOSE-CAPILLARY: 198 mg/dL — AB (ref 65–99)
Glucose-Capillary: 199 mg/dL — ABNORMAL HIGH (ref 65–99)

## 2017-11-13 LAB — LIPID PANEL
Cholesterol: 90 mg/dL (ref 0–200)
HDL: 28 mg/dL — ABNORMAL LOW (ref >40)
LDL CALC: 28 mg/dL (ref 0–99)
Total CHOL/HDL Ratio: 3.2 RATIO
Triglycerides: 169 mg/dL — ABNORMAL HIGH (ref <150)
VLDL: 34 mg/dL (ref 0–40)

## 2017-11-13 LAB — HEMOGLOBIN A1C
HEMOGLOBIN A1C: 7.1 % — AB (ref 4.8–5.6)
Mean Plasma Glucose: 157.07 mg/dL

## 2017-11-13 MED ORDER — ASPIRIN EC 325 MG PO TBEC: 325.0000 mg | DELAYED_RELEASE_TABLET | Freq: Every day | ORAL | 3 refills | Status: AC

## 2017-11-13 NOTE — Care Management Obs Status (Signed)
[place] NOTIFICATION   Patient Details  Name: Abisai Coble MRN: [id number] Date of Birth: Mar 15, 1951   Medicare Observation Status Notification Given:  Yes Husband signed    Shelbie Ammons, RN 11/13/2017, 12:06 PM

## 2017-11-13 NOTE — Consult Note (Addendum)
   [facility]urology [facility] DOB: 10-Mar-1951 MEDICAL RECORD [id number] Cynthia Dean  Impression:  Thalamic Stroke #1. Stroke Alert - negative #2. Discussed with Ed,MD, that the patient should have inpaient followup  Differential Diagnosis:   1. Cardioembolic stroke  2. Small vessel disease/lacune  3. Thromboembolic, artery-to-artery mechanism  4. Hypercoagulable state-related infarct  5. Transient ischemic attack  6. Thrombotic mechanism, large artery disease   Comments:   STAT  Recommendations:  ECHO, A1c, LDL, and followup with neruology, Tele, IV fludis, DVT prophy, PT to consult.  Will need Asprin, Inpatient neurology consultation Inpatient stroke evaluation as per Neurology/ Internal Medicine Discussed with ED MD @ bedisde with patient Continue DAP, consider AFIB? Please call with questions  -----------------------------------------------------------------------------------------  CC  History of Present Illness   Patient is a questionable afib person, with dap, who got to the ER, and symptoms have resolved, she had weakness, and smile looks different. she has fulls strength, and was presenting with slurred speech and facial droop. She woke up this morning at 7 am, and it lasted for about three hours, and then she went to church, and daughter stated to go to ER, and at noon resolved.   When speaking this morning she staed that she was slurring her speech, and when she put water in her mouth and would want to seep out on the right side of her mouth.  The patient is able to walk.   Diagnostic:  CT Head showed no acute abnormality, chronic acute change  MRI showed: Motion acute and subacute right thalamic infarct, old MCA territory infarct. .   Exam:  NIHSS is 0  1A: Level of Consciousness - Alert; keenly responsive 0 1B: Ask Month and Age - Both Questions Right 0 1C: 'Blink Eyes' & 'Squeeze Hands' - Performs Both Tasks 0 2: Test  Horizontal Extraocular Movements - Normal 0 3: Test Visual Fields - No V isual Loss 0 4: Test Facial Palsy - Normal symmetry 0 5A: Test Left Arm Motor Drift - No Drift for 10 Seconds 0 5B: Test Right Arm Motor Drift - No Drift for 10 Seconds 0 6A: Test Left Leg Motor Drift - No Drift for 5 Seconds 0 6B: Test Right Leg Motor Drift - No Drift for 5 Seconds 0 7: Test Limb Ataxia - No Ataxia 0 8: Test Sensation - Normal; No sensory loss 0 9: Test Language/Aphasia - Normal; No aphasia 0 10: Test Dysarthria - Normal 0 11: Test Extinction/Inattention - No abnormality 0       Medical Decision Making:  - Extensive number of diagnosis or management options are considered above.   - Extensive amount of complex data reviewed.   - High risk of  complication and/or morbidity or mortality are associated with differential diagnostic considerations above.  - There may be Uncertain outcome and increased probability of prolonged functional impairment or high probability of severe prolonged functional impairment associated with some of these differential diagnosis.  Medical Data Reviewed:  1.Data reviewed include clinical labs, radiology,  Medical Tests;   2.Tests results discussed w/performing or interpreting physician;   3.Obtain ing/reviewing old medical records;  4.Obtaining case history from another source;  5.Independent review of image, tracing or specimen.    Patient was informed the Neurology Consult would happen via telehealth (remote video) and consented to receiving care in this manner.

## 2017-11-13 NOTE — Progress Notes (Signed)
SLP Cancellation Note  Patient Details Name: Cynthia Dean MRN: [id number] DOB: 04-11-1951   Cancelled treatment:       Reason Eval/Treat Not Completed: SLP screened, no needs identified, will sign off(chart reviewed; consulted NSG then met w/ pt/family in room). Pt was eating her meals upon entering room; also talking w/ family while eating. Pt denied any difficulty swallowing and is currently on a regular diet; tolerates swallowing pills w/ water per NSG. Pt conversed at conversational level w/out deficits noted; pt and family denied any speech-language deficits "now". She denied need for an evaluation.  No further skilled ST services indicated as pt appears at her baseline. Recommend general aspiration precautions. Pt agreed. NSG to reconsult if any change in status.     Orinda Kenner, MS, CCC-SLP Watson,Katherine 11/13/2017, 12:07 PM

## 2017-11-13 NOTE — Progress Notes (Addendum)
Patient A&Ox4. Patient given discharge and first dose medications instructions. Patient instructed to attend all follow up appointments and to call if changes need to be made. IV removed. Patient spoke with MD about completing Dialysis tomorrow. VS stable. Husband to transport patient home. Will continue to monitor Kyla Balzarine, LPN

## 2017-11-13 NOTE — Discharge Summary (Addendum)
[facility] [place] NAME: Cynthia Dean    MR#:  [id number]  [address]:  June 05, 1951  DATE OF ADMISSION:  11/12/2017 ADMITTING PHYSICIAN: Lance Coon, MD  DATE OF DISCHARGE: 11/13/2017  PRIMARY CARE PHYSICIAN: Lowella Bandy, MD   ADMISSION DIAGNOSIS:  1. cva  2.  Hypertension 3.  Type 2 diabetes mellitus 4.  Coronary artery disease  DISCHARGE DIAGNOSIS:  1.  Subacute right thalamus infarct 2 hypertension. 3.  Coronary artery disease 4.  Type 2 diabetes mellitus 5.  History of CVA  SECONDARY DIAGNOSIS:   Past Medical History:  Diagnosis Date  . COPD (chronic obstructive pulmonary disease) ([place])   . Coronary artery disease   . Diabetes mellitus without complication ([place])   . ESRD on dialysis ([place])   . Heart murmur   . Hyperlipidemia   . Hypertension   . Stroke [facility])      ADMITTING HISTORY Cynthia Dean  is a 67 y.o. female who presents with an episode of dysarthria and right facial weakness.  Patient states that when she woke up this morning she noticed the symptoms.  She had some slurred speech, and when she would drink she noticed that she would dribble some from the right side of her mouth.  She went to church and came back around lunchtime, and at that time noticed that her symptoms were resolving.  She came to the ED for evaluation after that.  Here she was found to have acute punctate stroke of her thalamus.  Her symptoms have already mostly resolved.  Imaging also shows 2 areas of previous stroke.  Hospitalist were called for admission and further evaluation  HOSPITAL COURSE:  Patient admitted to the hospital.  Her speech completely improved and came back to baseline.  No facial droop and any dribbling when she drinks fluids.  No difficulty swallowing food.  Aspirin 81 mg at home.  We will increase aspirin dose to 325 mg orally daily.  Follow-up with nephrology for dialysis.  Lipid profile was checked total  cholesterol 90 had HDL was 28 LDL was 28 triglycerides 169.  Patient was worked up with MRI brain and MRA head and neck.  MRI brain showed subacute infarct in the right thalamus.  Patient will follow-up with neurology as outpatient.  Patient will continue oral aspirin 325 daily. Echocardiogram results Left ventricle: The cavity size was normal. Wall thickness was   increased in a pattern of mild LVH. Systolic function was normal.   The estimated ejection fraction was in the range of 55% to 65%.   Left ventricular diastolic function parameters were normal. - Aortic valve: Valve area (Vmax): 2.01 cm^2. - Mitral valve: There was mild regurgitation CONSULTS OBTAINED:  Treatment Team:  Alexis Goodell, MD Lavonia Dana, MD  DRUG ALLERGIES:   Allergies  Allergen Reactions  . Tape Itching    Skin Dermatitis/itching (tape adhesive)    DISCHARGE MEDICATIONS:   Allergies as of 11/13/2017      Reactions   Tape Itching   Skin Dermatitis/itching (tape adhesive)      Medication List    TAKE these medications   aspirin 81 MG tablet Take 81 mg by mouth daily.   atorvastatin 80 MG tablet Commonly known as:  LIPITOR Take 80 mg by mouth every evening.   budesonide-formoterol 160-4.5 MCG/ACT inhaler Commonly known as:  SYMBICORT Inhale 2 puffs into the lungs 2 (two) times daily.   cinacalcet 60 MG tablet Commonly known  as:  SENSIPAR Take 60 mg by mouth daily.   cloNIDine 0.2 mg/24hr patch Commonly known as:  CATAPRES - Dosed in mg/24 hr Place 1 patch onto the skin once a week.   clopidogrel 75 MG tablet Commonly known as:  PLAVIX TAKE ONE TABLET BY MOUTH ONCE DAILY   feeding supplement (NEPRO CARB STEADY) Liqd Take 237 mLs by mouth 2 (two) times daily between meals.   gabapentin 100 MG capsule Commonly known as:  NEURONTIN Take 100 mg by mouth 3 (three) times daily.   insulin NPH-regular Human (70-30) 100 UNIT/ML injection Commonly known as:  NOVOLIN 70/30 Inject 40  Units into the skin 2 (two) times daily with a meal. Except dialysis days. Do not take on Tuesdays, Thursdays, and Saturdays.   ipratropium-albuterol 0.5-2.5 (3) MG/3ML Soln Commonly known as:  DUONEB Take 3 mLs by nebulization every 6 (six) hours as needed (shortness of breath/ wheezing).   albuterol-ipratropium 18-103 MCG/ACT inhaler Commonly known as:  COMBIVENT Inhale 2 puffs into the lungs 2 (two) times daily.   lidocaine-prilocaine cream Commonly known as:  EMLA Apply 1 application topically as needed (port access).   losartan 100 MG tablet Commonly known as:  COZAAR Take 100 mg by mouth daily.   metoprolol succinate 100 MG 24 hr tablet Commonly known as:  TOPROL-XL Take 100 mg by mouth every evening. Take with or immediately following a meal.   omeprazole 20 MG capsule Commonly known as:  PRILOSEC Take 20 mg by mouth 2 (two) times daily.   sevelamer carbonate 800 MG tablet Commonly known as:  RENVELA Take 2,400 mg by mouth 3 (three) times daily with meals.   torsemide 100 MG tablet Commonly known as:  DEMADEX Take 100 mg by mouth daily.     Aspirin increased to 325 mg orally daily  Today  Patient seen and evaluated today No slurred speech No difficulty swallowing food No tingling or numbness  VITAL SIGNS:  Blood pressure (!) 156/82, pulse 63, temperature 98.1 F (36.7 C), temperature source Oral, resp. rate 18, height 5\' 7"  (1.702 m), weight 97.5 kg (215 lb), SpO2 94 %.  I/O:    Intake/Output Summary (Last 24 hours) at 11/13/2017 1218 Last data filed at 11/13/2017 0956 Gross per 24 hour  Intake 360 ml  Output 300 ml  Net 60 ml    PHYSICAL EXAMINATION:  Physical Exam  GENERAL:  67 y.o.-year-old patient lying in the bed with no acute distress.  LUNGS: Normal breath sounds bilaterally, no wheezing, rales,rhonchi or crepitation. No use of accessory muscles of respiration.  CARDIOVASCULAR: S1, S2 normal. No murmurs, rubs, or gallops.  ABDOMEN: Soft,  non-tender, non-distended. Bowel sounds present. No organomegaly or mass.  NEUROLOGIC: Moves all 4 extremities. PSYCHIATRIC: The patient is alert and oriented x 3.  SKIN: No obvious rash, lesion, or ulcer.   DATA REVIEW:   CBC Recent Labs  Lab 11/12/17 1250  WBC 6.8  HGB 11.4*  HCT 35.0  PLT 195    Chemistries  Recent Labs  Lab 11/12/17 1250  NA 138  K 3.6  CL 100*  CO2 30  GLUCOSE 71  BUN 42*  CREATININE 8.57*  CALCIUM 8.4*  AST 17  ALT 17  ALKPHOS 94  BILITOT 0.7    Cardiac Enzymes Recent Labs  Lab 11/12/17 1250  TROPONINI <0.03    Microbiology Results  Results for orders placed or performed during the hospital encounter of 06/27/17  MRSA PCR Screening     Status: None  Collection Time: 06/27/17  6:10 PM  Result Value Ref Range Status   MRSA by PCR NEGATIVE NEGATIVE Final    Comment:        The GeneXpert MRSA Assay (FDA approved for NASAL specimens only), is one component of a comprehensive MRSA colonization surveillance program. It is not intended to diagnose MRSA infection nor to guide or monitor treatment for MRSA infections.     RADIOLOGY:  Ct Head Wo Contrast  Result Date: 11/12/2017 CLINICAL DATA:  Onset of slurred speech this morning. EXAM: CT HEAD WITHOUT CONTRAST TECHNIQUE: Contiguous axial images were obtained from the base of the skull through the vertex without intravenous contrast. COMPARISON:  Brain MRI and head CT scan 06/27/2017. FINDINGS: Brain: No evidence of acute infarction, hemorrhage, hydrocephalus, extra-axial collection or mass lesion/mass effect. Right frontal and left parietal infarcts are unchanged. There is some atrophy and chronic microvascular ischemic disease. Vascular: Extensive atherosclerosis. Skull: Intact. Sinuses/Orbits: Negative. Other: None. IMPRESSION: No acute abnormality. Atrophy, chronic microvascular ischemic change and remote right frontal and left parietal infarcts, unchanged. Atherosclerosis.  Electronically Signed   By: Inge Rise M.D.   On: 11/12/2017 13:22   Mr Angiogram Head Wo Contrast  Result Date: 11/12/2017 CLINICAL DATA:  Slurred speech and right facial droop. EXAM: MRA NECK WITHOUT CONTRAST MRA HEAD WITHOUT CONTRAST TECHNIQUE: Multiplanar and multiecho pulse sequences of the neck were obtained without intravenous contrast. Angiographic images of the neck were obtained using MRA technique without intravenous contrast.; Angiographic images of the Circle of Willis were obtained using MRA technique without intravenous contrast. COMPARISON:  Brain MRI 11/12/2017 FINDINGS: MRA NECK FINDINGS Time-of-flight MRA of the neck shows no hemodynamically significant stenosis of the vertebral or carotid arteries. Visualization of the proximal arteries is limited. MRA HEAD FINDINGS ANTERIOR CIRCULATION: --Intracranial internal carotid arteries: Normal. --Anterior cerebral arteries: Normal. Both A1 segments are present. Patent anterior communicating artery. --Middle cerebral arteries: Normal. --Posterior communicating arteries: Absent bilaterally. POSTERIOR CIRCULATION: --Basilar artery: Normal. --Posterior cerebral arteries: Normal. --Superior cerebellar arteries: Normal. --Inferior cerebellar arteries: Normal anterior and posterior inferior cerebellar arteries. IMPRESSION: Normal MRA of the head and neck. Electronically Signed   By: Ulyses Jarred M.D.   On: 11/12/2017 23:10   Mr Jodene Nam Neck Wo Contrast  Result Date: 11/12/2017 CLINICAL DATA:  Slurred speech and right facial droop. EXAM: MRA NECK WITHOUT CONTRAST MRA HEAD WITHOUT CONTRAST TECHNIQUE: Multiplanar and multiecho pulse sequences of the neck were obtained without intravenous contrast. Angiographic images of the neck were obtained using MRA technique without intravenous contrast.; Angiographic images of the Circle of Willis were obtained using MRA technique without intravenous contrast. COMPARISON:  Brain MRI 11/12/2017 FINDINGS: MRA NECK  FINDINGS Time-of-flight MRA of the neck shows no hemodynamically significant stenosis of the vertebral or carotid arteries. Visualization of the proximal arteries is limited. MRA HEAD FINDINGS ANTERIOR CIRCULATION: --Intracranial internal carotid arteries: Normal. --Anterior cerebral arteries: Normal. Both A1 segments are present. Patent anterior communicating artery. --Middle cerebral arteries: Normal. --Posterior communicating arteries: Absent bilaterally. POSTERIOR CIRCULATION: --Basilar artery: Normal. --Posterior cerebral arteries: Normal. --Superior cerebellar arteries: Normal. --Inferior cerebellar arteries: Normal anterior and posterior inferior cerebellar arteries. IMPRESSION: Normal MRA of the head and neck. Electronically Signed   By: Ulyses Jarred M.D.   On: 11/12/2017 23:10   Mr Brain Wo Contrast  Result Date: 11/12/2017 CLINICAL DATA:  Slurred speech and facial droop. History of stroke, end-stage renal disease on dialysis, hypertension, hyperlipidemia, diabetes. EXAM: MRI HEAD WITHOUT CONTRAST TECHNIQUE: Multiplanar, multiecho pulse sequences of the  brain and surrounding structures were obtained without intravenous contrast. COMPARISON:  CT HEAD November 12, 2017 and MRI of the head June 27, 2017. FINDINGS: Mild motion degraded examination. INTRACRANIAL CONTENTS: Punctate reduced diffusion RIGHT thalamus, too small to localized on ADC map. No susceptibility artifact to suggest hemorrhage. Minimal susceptibility artifact associated with RIGHT frontal lobe encephalomalacia. LEFT temporoparietal lobe encephalomalacia. Old bilateral cerebellar infarcts. Old LEFT basal ganglia lacunar infarct with mild ex vacuo dilatation subjacent ventricle. Moderate parenchymal brain volume loss. No hydrocephalus. Patchy supratentorial and pontine white matter FLAIR T2 hyperintensities exclusive of the aforementioned abnormality. No midline shift, mass effect or masses. No abnormal extra-axial fluid collections.  VASCULAR: Normal major intracranial vascular flow voids present at skull base. SKULL AND UPPER CERVICAL SPINE: No abnormal sellar expansion. No suspicious calvarial bone marrow signal. Craniocervical junction maintained. SINUSES/ORBITS: The mastoid air-cells and included paranasal sinuses are well-aerated.The included ocular globes and orbital contents are non-suspicious. OTHER: None. IMPRESSION: 1. Motion degraded examination. Punctate acute/subacute RIGHT thalamus infarct versus artifact. 2. Old RIGHT frontal (MCA territory) and LEFT temporoparietal (MCA territory) infarcts. 3. Moderate chronic small vessel ischemic disease. Old LEFT basal ganglia and cerebellar small infarcts. 4. Moderate parenchymal brain volume loss. Electronically Signed   By: Elon Alas M.D.   On: 11/12/2017 18:26    Follow up with PCP in 1 week.  Management plans discussed with the patient, family and they are in agreement.  CODE STATUS: Full code    Code Status Orders  (From admission, onward)        Start     Ordered   11/12/17 2220  Full code  Continuous     11/12/17 2219    Code Status History    Date Active Date Inactive Code Status Order ID Comments User Context   04/14/2016 1330 04/14/2016 1700 Full Code [id number]  Bettey Costa, MD ED   08/08/2015 2257 08/09/2015 2054 Full Code [id number]  Hower, Aaron Mose, MD ED   04/17/2015 1334 04/17/2015 1836 Full Code [id number]  Dew, Erskine Squibb, MD Inpatient      TOTAL TIME TAKING CARE OF THIS PATIENT ON DAY OF DISCHARGE: more than 34 minutes.   Saundra Shelling M.D on 11/13/2017 at 12:18 PM  Between 7am to 6pm - Pager - [phone]  After 6pm go to [email or web address] - password [email or web address]  Office  [phone]  CC: Primary care physician; Lowella Bandy, MD  Note: This dictation was prepared with Dragon dictation along with smaller phrase technology. Any transcriptional errors that result from this process are unintentional.

## 2017-11-13 NOTE — Progress Notes (Signed)
[facility]  ROUNDING NOTE   Subjective:   [name] admitted to [facility] on 11/12/2017 for TIA (transient ischemic attack) [G45.9] Cerebrovascular accident (CVA), unspecified mechanism ([place]) [I63.9]  Last hemodialysis treatment was Saturday.   Husband at bedside.   Objective:  Vital signs in last 24 hours:  Temp:  [97.5 F (36.4 C)-98.1 F (36.7 C)] 98.1 F (36.7 C) (04/22 1149) Pulse Rate:  [55-64] 63 (04/22 1149) Resp:  [13-21] 18 (04/22 1149) BP: (144-194)/(55-88) 156/82 (04/22 1149) SpO2:  [94 %-100 %] 94 % (04/22 0232)  Weight change:  Filed Weights   11/12/17 1243  Weight: 97.5 kg (215 lb)    Intake/Output: I/O last 3 completed shifts: In: -  Out: 300 [Urine:300]   Intake/Output this shift:  Total I/O In: 600 [P.O.:600] Out: -   Physical Exam: General: NAD,   Head: Normocephalic, atraumatic. Moist oral mucosal membranes  Eyes: Anicteric, PERRL  Neck: Supple, trachea midline  Lungs:  Clear to auscultation  Heart: Regular rate and rhythm  Abdomen:  Soft, nontender,   Extremities: no peripheral edema.  Neurologic: Nonfocal, moving all four extremities  Skin: No lesions  Access: Left AVG    Basic Metabolic Panel: Recent Labs  Lab 11/12/17 1250  NA 138  K 3.6  CL 100*  CO2 30  GLUCOSE 71  BUN 42*  CREATININE 8.57*  CALCIUM 8.4*    Liver Function Tests: Recent Labs  Lab 11/12/17 1250  AST 17  ALT 17  ALKPHOS 94  BILITOT 0.7  PROT 7.2  ALBUMIN 3.8   No results for input(s): LIPASE, AMYLASE in the last 168 hours. No results for input(s): AMMONIA in the last 168 hours.  CBC: Recent Labs  Lab 11/12/17 1250  WBC 6.8  NEUTROABS 3.7  HGB 11.4*  HCT 35.0  MCV 86.9  PLT 195    Cardiac Enzymes: Recent Labs  Lab 11/12/17 1250  TROPONINI <0.03    BNP: Invalid input(s): POCBNP  CBG: Recent Labs  Lab 11/12/17 2236 11/13/17 0819 11/13/17 1228  GLUCAP 145* 198* 199*    Microbiology: Results for  orders placed or performed during the hospital encounter of 06/27/17  MRSA PCR Screening     Status: None   Collection Time: 06/27/17  6:10 PM  Result Value Ref Range Status   MRSA by PCR NEGATIVE NEGATIVE Final    Comment:        The GeneXpert MRSA Assay (FDA approved for NASAL specimens only), is one component of a comprehensive MRSA colonization surveillance program. It is not intended to diagnose MRSA infection nor to guide or monitor treatment for MRSA infections.     Coagulation Studies: Recent Labs    11/12/17 1250  LABPROT 11.8  INR 0.87    Urinalysis: Recent Labs    11/12/17 2240  COLORURINE YELLOW*  LABSPEC 1.011  PHURINE 8.0  GLUCOSEU 50*  HGBUR SMALL*  BILIRUBINUR NEGATIVE  KETONESUR NEGATIVE  PROTEINUR 100*  NITRITE NEGATIVE  LEUKOCYTESUR TRACE*      Imaging: Ct Head Wo Contrast  Result Date: 11/12/2017 CLINICAL DATA:  Onset of slurred speech this morning. EXAM: CT HEAD WITHOUT CONTRAST TECHNIQUE: Contiguous axial images were obtained from the base of the skull through the vertex without intravenous contrast. COMPARISON:  Brain MRI and head CT scan 06/27/2017. FINDINGS: Brain: No evidence of acute infarction, hemorrhage, hydrocephalus, extra-axial collection or mass lesion/mass effect. Right frontal and left parietal infarcts are unchanged. There is some atrophy and chronic microvascular ischemic disease.  Vascular: Extensive atherosclerosis. Skull: Intact. Sinuses/Orbits: Negative. Other: None. IMPRESSION: No acute abnormality. Atrophy, chronic microvascular ischemic change and remote right frontal and left parietal infarcts, unchanged. Atherosclerosis. Electronically Signed   By: Inge Rise M.D.   On: 11/12/2017 13:22   Mr Angiogram Head Wo Contrast  Result Date: 11/12/2017 CLINICAL DATA:  Slurred speech and right facial droop. EXAM: MRA NECK WITHOUT CONTRAST MRA HEAD WITHOUT CONTRAST TECHNIQUE: Multiplanar and multiecho pulse sequences of the  neck were obtained without intravenous contrast. Angiographic images of the neck were obtained using MRA technique without intravenous contrast.; Angiographic images of the Circle of Willis were obtained using MRA technique without intravenous contrast. COMPARISON:  Brain MRI 11/12/2017 FINDINGS: MRA NECK FINDINGS Time-of-flight MRA of the neck shows no hemodynamically significant stenosis of the vertebral or carotid arteries. Visualization of the proximal arteries is limited. MRA HEAD FINDINGS ANTERIOR CIRCULATION: --Intracranial internal carotid arteries: Normal. --Anterior cerebral arteries: Normal. Both A1 segments are present. Patent anterior communicating artery. --Middle cerebral arteries: Normal. --Posterior communicating arteries: Absent bilaterally. POSTERIOR CIRCULATION: --Basilar artery: Normal. --Posterior cerebral arteries: Normal. --Superior cerebellar arteries: Normal. --Inferior cerebellar arteries: Normal anterior and posterior inferior cerebellar arteries. IMPRESSION: Normal MRA of the head and neck. Electronically Signed   By: Ulyses Jarred M.D.   On: 11/12/2017 23:10   Mr Jodene Nam Neck Wo Contrast  Result Date: 11/12/2017 CLINICAL DATA:  Slurred speech and right facial droop. EXAM: MRA NECK WITHOUT CONTRAST MRA HEAD WITHOUT CONTRAST TECHNIQUE: Multiplanar and multiecho pulse sequences of the neck were obtained without intravenous contrast. Angiographic images of the neck were obtained using MRA technique without intravenous contrast.; Angiographic images of the Circle of Willis were obtained using MRA technique without intravenous contrast. COMPARISON:  Brain MRI 11/12/2017 FINDINGS: MRA NECK FINDINGS Time-of-flight MRA of the neck shows no hemodynamically significant stenosis of the vertebral or carotid arteries. Visualization of the proximal arteries is limited. MRA HEAD FINDINGS ANTERIOR CIRCULATION: --Intracranial internal carotid arteries: Normal. --Anterior cerebral arteries: Normal. Both  A1 segments are present. Patent anterior communicating artery. --Middle cerebral arteries: Normal. --Posterior communicating arteries: Absent bilaterally. POSTERIOR CIRCULATION: --Basilar artery: Normal. --Posterior cerebral arteries: Normal. --Superior cerebellar arteries: Normal. --Inferior cerebellar arteries: Normal anterior and posterior inferior cerebellar arteries. IMPRESSION: Normal MRA of the head and neck. Electronically Signed   By: Ulyses Jarred M.D.   On: 11/12/2017 23:10   Mr Brain Wo Contrast  Result Date: 11/12/2017 CLINICAL DATA:  Slurred speech and facial droop. History of stroke, end-stage renal disease on dialysis, hypertension, hyperlipidemia, diabetes. EXAM: MRI HEAD WITHOUT CONTRAST TECHNIQUE: Multiplanar, multiecho pulse sequences of the brain and surrounding structures were obtained without intravenous contrast. COMPARISON:  CT HEAD November 12, 2017 and MRI of the head June 27, 2017. FINDINGS: Mild motion degraded examination. INTRACRANIAL CONTENTS: Punctate reduced diffusion RIGHT thalamus, too small to localized on ADC map. No susceptibility artifact to suggest hemorrhage. Minimal susceptibility artifact associated with RIGHT frontal lobe encephalomalacia. LEFT temporoparietal lobe encephalomalacia. Old bilateral cerebellar infarcts. Old LEFT basal ganglia lacunar infarct with mild ex vacuo dilatation subjacent ventricle. Moderate parenchymal brain volume loss. No hydrocephalus. Patchy supratentorial and pontine white matter FLAIR T2 hyperintensities exclusive of the aforementioned abnormality. No midline shift, mass effect or masses. No abnormal extra-axial fluid collections. VASCULAR: Normal major intracranial vascular flow voids present at skull base. SKULL AND UPPER CERVICAL SPINE: No abnormal sellar expansion. No suspicious calvarial bone marrow signal. Craniocervical junction maintained. SINUSES/ORBITS: The mastoid air-cells and included paranasal sinuses are well-aerated.The  included ocular globes  and orbital contents are non-suspicious. OTHER: None. IMPRESSION: 1. Motion degraded examination. Punctate acute/subacute RIGHT thalamus infarct versus artifact. 2. Old RIGHT frontal (MCA territory) and LEFT temporoparietal (MCA territory) infarcts. 3. Moderate chronic small vessel ischemic disease. Old LEFT basal ganglia and cerebellar small infarcts. 4. Moderate parenchymal brain volume loss. Electronically Signed   By: Elon Alas M.D.   On: 11/12/2017 18:26     Medications:    . aspirin  81 mg Oral Daily  . atorvastatin  80 mg Oral QPM  . cinacalcet  60 mg Oral Daily  . clopidogrel  75 mg Oral Daily  . heparin  5,000 Units Subcutaneous Q8H  . insulin aspart  0-5 Units Subcutaneous QHS  . insulin aspart  0-9 Units Subcutaneous TID WC  . pantoprazole  40 mg Oral Daily  . sevelamer carbonate  2,400 mg Oral TID WC   acetaminophen **OR** acetaminophen (TYLENOL) oral liquid 160 mg/5 mL **OR** acetaminophen  Assessment/ Plan:  [name] is a 67 y.o. 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  End-stage renal disease, on hemodialysis 2.  Severe hypertension 3.  Anemia of chronic kidney disease 4.  Secondary hyperparathyroidism 5.  Diabetes type 2 with complications of neuropathy and retinopathy and CKD  Plan: Patient to be discharged. Have arranged for hemodialysis tomorrow at outpatient.    LOS: 0 Manreet Kiernan 4/22/20193:07 PM

## 2017-11-13 NOTE — Care Management Note (Signed)
Case Management Note  Patient Details  Name: Cynthia Dean MRN: [id number] Date of Birth: [birth year]  Subjective/Objective:  Admitted to [facility] under observation with the diagnosis of stroke. Lives with husband, Patrica Duel [phone]). Prescriptions are filled at [facility] in [place]. Last seen Dr. Marilynn Rail about a month ago. [facility] x 2 in the past. No skilled nursing. No home oxygen. Rolling walker in the home. Takes care of all basic activities of daily living herself. Established dialysis x 2 years at [facility] on [address]. Rides the transport [place] to dialysis. Monday-Wednesday-Friday, Will update Elvera Bicker, [employer] representative                 Action/Plan: No needs. Discharge to home today per Dr. Estanislado Pandy   Expected Discharge Date:                  Expected Discharge Plan:     In-House Referral:     Discharge planning Services     Post Acute Care Choice:    Choice offered to:     DME Arranged:    DME Agency:     HH Arranged:    [place] Agency:     Status of Service:     If discussed at [place] of Stay Meetings, dates discussed:    Additional Comments:  Shelbie Ammons, RN MSN CCM Care Management [phone] 11/13/2017, 12:10 PM

## 2017-11-13 NOTE — Progress Notes (Signed)
Advanced care plan.  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 Patient wants everything done, cardiac resuscitation, intubation, ventilator if need arises. CODE STATUS: Full code Time spent discussing advanced care planning: 16 minutes

## 2017-11-13 NOTE — Progress Notes (Signed)
OT Cancellation Note  Patient Details Name: Cynthia Dean MRN: [id number] DOB: 07/06/1951   Cancelled Treatment:    Reason Eval/Treat Not Completed: Other (comment). Order received, chart reviewed. Pt noted with discharge order in. Spoke with RN. Pt discharging shortly. Unavailable for OT evaluation at this time. Will re-attempt as appropriate.   Jeni Salles, MPH, MS, OTR/L ascom [phone] 11/13/17, 1:40 PM

## 2017-11-13 NOTE — Progress Notes (Signed)
OT Cancellation Note  Patient Details Name: Cynthia Dean MRN: [id number] DOB: 28-Jul-1950   Cancelled Treatment:    Reason Eval/Treat Not Completed: Other (comment). Order received, chart reviewed. Pt meeting with neurologist upon attempt. Will re-attempt OT evaluation at later date/time as pt is available and medically appropriate.   Jeni Salles, MPH, MS, OTR/L ascom [phone] 11/13/17, 11:29 AM

## 2017-11-13 NOTE — Evaluation (Signed)
Physical Therapy Evaluation Patient Details Name: Cynthia Dean MRN: [id number] DOB: July 14, 1951 Today's Date: 11/13/2017   History of Present Illness  presented to ER secondary to R facial weakness, dysathria; admitted for acute CVA work-up.  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  Appears with mild (and intermittent) word-finding difficulties (worsened with confrontational naming/questioning), but no significant dysarthria appreciated  Bilat UE/LE strength and ROM Grossly symmetrical and WFL; no focal weakness, sensory or coordination deficit appreciated.  Able to complete bed mobility indep; sit/stand, basic transfers and gait (25') without assist device, cga (160' with RW, cga/close sup).  Higher-level balance deficits noted (see balance description); do recommend use of RW for optimal safety/indep at discharge.  Patient voices agreement and understanding. Would benefit from skilled PT to address above deficits and promote optimal return to PLOF; Recommend transition to [facility] upon discharge from acute hospitalization.     Follow Up Recommendations Home health PT    Equipment Recommendations       Recommendations for Other Services       Precautions / Restrictions Precautions Precautions: Fall Precaution Comments: No BP L UE Restrictions Weight Bearing Restrictions: No      Mobility  Bed Mobility Overal bed mobility: Modified Independent                Transfers Overall transfer level: Needs assistance   Transfers: Sit to/from Stand Sit to Stand: Supervision         General transfer comment: performed with and without assist device; minimal/no change in performance  Ambulation/Gait Ambulation/Gait assistance: Supervision;Min guard Ambulation Distance  (Feet): 25 Feet Assistive device: None       General Gait Details: increased lateral sway, intermittently reaching for furniture for stabilizaiton  Stairs            Wheelchair Mobility    Modified Rankin (Stroke Patients Only)       Balance Overall balance assessment: Needs assistance Sitting-balance support: No upper extremity supported;Feet supported Sitting balance-Leahy Scale: Good     Standing balance support: No upper extremity supported Standing balance-Leahy Scale: Fair   Single Leg Stance - Right Leg: 2 Single Leg Stance - Left Leg: 1     Rhomberg - Eyes Opened: 30(needs help to attain position) Rhomberg - Eyes Closed: 5(min assist to recover balance; multi-directional sway)                 Pertinent Vitals/Pain Pain Assessment: No/denies pain    Home Living Family/patient expects to be discharged to:: Private residence Living Arrangements: Spouse/significant other Available Help at Discharge: Family(husband works outside of the home) Type of Home: House Home Access: Stairs to enter Entrance Stairs-Rails: Right;Left;Can reach both Technical brewer of Steps: 3 Home Layout: One level Home Equipment: Walker - 2 wheels      Prior Function Level of Independence: Independent         Comments: Ind with amb limited community distances without AD, no fall history, Ind with ADLs.  Does intermittently use SPC (for longer, outdoor distances) when "balance is off"     Hand Dominance        Extremity/Trunk Assessment   Upper Extremity Assessment Upper Extremity Assessment: Overall WFL for tasks assessed(grossly 4+ to 5/5 throughout; no focal weakness, sensory or coordination deficits)    Lower Extremity Assessment Lower Extremity Assessment: Overall [facility]  for tasks assessed(grossly 4+ to 5/5 throughout; no focal weakness, sensory or coordination deficits)       Communication   Communication: No difficulties  Cognition  Arousal/Alertness: Awake/alert Behavior During Therapy: WFL for tasks assessed/performed Overall Cognitive Status: Within Functional Limits for tasks assessed                                        General Comments      Exercises Other Exercises Other Exercises: 2' with RW, cga/close sup-improved gait fluidity and overall comfort/confidence; prefers to maintain use of RW for longer distances (has one available to use at home as needed)   Assessment/Plan    PT Assessment Patient needs continued PT services  PT Problem List Decreased mobility;Decreased balance       PT Treatment Interventions DME instruction;Gait training;Stair training;Functional mobility training;Therapeutic activities;Neuromuscular re-education;Therapeutic exercise;Balance training;Patient/family education    PT Goals (Current goals can be found in the Care Plan section)  Acute Rehab PT Goals Patient Stated Goal: to return home PT Goal Formulation: With patient Time For Goal Achievement: 11/27/17 Potential to Achieve Goals: Good    Frequency Min 2X/week   Barriers to discharge        Co-evaluation               AM-PAC PT "6 Clicks" Daily Activity  Outcome Measure Difficulty turning over in bed (including adjusting bedclothes, sheets and blankets)?: None Difficulty moving from lying on back to sitting on the side of the bed? : None Difficulty sitting down on and standing up from a chair with arms (e.g., wheelchair, bedside commode, etc,.)?: A Little Help needed moving to and from a bed to chair (including a wheelchair)?: A Little Help needed walking in hospital room?: A Little Help needed climbing 3-5 steps with a railing? : A Little 6 Click Score: 20    End of Session Equipment Utilized During Treatment: Gait belt Activity Tolerance: Patient tolerated treatment well Patient left: in chair;with chair alarm set;with call bell/phone within reach Nurse Communication: Mobility  status PT Visit Diagnosis: Difficulty in walking, not elsewhere classified (R26.2)    Time: 4235-3614 PT Time Calculation (min) (ACUTE ONLY): 20 min   Charges:   PT Evaluation $PT Eval Low Complexity: 1 Low PT Treatments $Gait Training: 8-22 mins   PT G Codes:        Briawna Carver H. Owens Shark, PT, DPT, NCS 11/13/17, 2:06 PM [phone]

## 2017-11-13 NOTE — Consult Note (Signed)
Referring Physician: Pyreddy    Chief Complaint: Right facial droop  HPI: Cynthia Dean is an 67 y.o. female who reports onset of right facial droop and slurring of speech on 4/21.  Symptoms resolved by the time of presentation and therefore patient not a tPA candidate.  Patient with recent infarct in December of 2018 in right frontal lobe.   Initial NIHSS of 0.  Date last known well: Date: 11/12/2017 Time last known well: Time: 08:00 tPA Given: No: Resolution of symptoms  Past Medical History:  Diagnosis Date  . COPD (chronic obstructive pulmonary disease) ([place])   . Coronary artery disease   . Diabetes mellitus without complication ([place])   . ESRD on dialysis ([place])   . Heart murmur   . Hyperlipidemia   . Hypertension   . Stroke [facility])     Past Surgical History:  Procedure Laterality Date  . A/V FISTULAGRAM Left 12/20/2016   Procedure: A/V Fistulagram;  Surgeon: Katha Cabal, MD;  Location: [place] [facility];  Service: Cardiovascular;  Laterality: Left;  . A/V SHUNT INTERVENTION N/A 12/20/2016   Procedure: A/V Shunt Intervention;  Surgeon: Katha Cabal, MD;  Location: [place] [facility];  Service: Cardiovascular;  Laterality: N/A;  . A/V SHUNTOGRAM Left 09/11/2017   Procedure: A/V SHUNTOGRAM;  Surgeon: Algernon Huxley, MD;  Location: [place] [facility];  Service: Cardiovascular;  Laterality: Left;  . BREAST BIOPSY Bilateral 07/19/00   neg  . PERIPHERAL VASCULAR CATHETERIZATION Left 02/02/2015   Procedure: A/V Shuntogram/Fistulagram;  Surgeon: Algernon Huxley, MD;  Location: [place] [facility];  Service: Cardiovascular;  Laterality: Left;  . PERIPHERAL VASCULAR CATHETERIZATION Left 02/02/2015   Procedure: A/V Shunt Intervention;  Surgeon: Algernon Huxley, MD;  Location: [place] [facility];  Service: Cardiovascular;  Laterality: Left;  . PERIPHERAL VASCULAR CATHETERIZATION Left 03/09/2015   Procedure: A/V Shuntogram/Fistulagram;  Surgeon: Algernon Huxley, MD;  Location:  [place] [facility];  Service: Cardiovascular;  Laterality: Left;  . PERIPHERAL VASCULAR CATHETERIZATION N/A 03/09/2015   Procedure: A/V Shunt Intervention;  Surgeon: Algernon Huxley, MD;  Location: [place] [facility];  Service: Cardiovascular;  Laterality: N/A;  . PERIPHERAL VASCULAR CATHETERIZATION Left 04/17/2015   Procedure: Upper Extremity Angiography;  Surgeon: Algernon Huxley, MD;  Location: [place] [facility];  Service: Cardiovascular;  Laterality: Left;  . PERIPHERAL VASCULAR CATHETERIZATION  04/17/2015   Procedure: Upper Extremity Intervention;  Surgeon: Algernon Huxley, MD;  Location: [place] [facility];  Service: Cardiovascular;;  . PERIPHERAL VASCULAR CATHETERIZATION N/A 08/10/2015   Procedure: A/V Shuntogram/Fistulagram;  Surgeon: Algernon Huxley, MD;  Location: [place] [facility];  Service: Cardiovascular;  Laterality: N/A;  . PERIPHERAL VASCULAR CATHETERIZATION N/A 08/10/2015   Procedure: A/V Shunt Intervention;  Surgeon: Algernon Huxley, MD;  Location: [place] [facility];  Service: Cardiovascular;  Laterality: N/A;  . PERIPHERAL VASCULAR CATHETERIZATION Left 04/11/2016   Procedure: A/V Shuntogram/Fistulagram;  Surgeon: Algernon Huxley, MD;  Location: [place] [facility];  Service: Cardiovascular;  Laterality: Left;    Family History  Problem Relation Age of Onset  . Breast cancer Father 34  . Stroke Mother   . Heart attack Mother   . Heart Problems Sister   . Breast cancer Cousin 51       1 st cousin. Maternal    Social History:  reports that she has quit smoking. Her smoking use included cigarettes. She smoked 0.00 packs per day. She has never used  smokeless tobacco. She reports that she does not drink alcohol or use drugs.  Allergies:  Allergies  Allergen Reactions  . Tape Itching    Skin Dermatitis/itching (tape adhesive)    Medications: I have reviewed the patient's current medications. Prior to Admission medications   Medication Sig Start Date End Date Taking? Authorizing  Provider  albuterol-ipratropium (COMBIVENT) 18-103 MCG/ACT inhaler Inhale 2 puffs into the lungs 2 (two) times daily.   Yes [provider]  atorvastatin (LIPITOR) 80 MG tablet Take 80 mg by mouth every evening.    Yes [provider]  cloNIDine (CATAPRES - DOSED IN MG/24 HR) 0.2 mg/24hr patch Place 1 patch onto the skin once a week. 10/09/17  Yes [provider]  clopidogrel (PLAVIX) 75 MG tablet TAKE ONE TABLET BY MOUTH ONCE DAILY 06/14/16  Yes Stegmayer, Joelene Millin A, PA-C  gabapentin (NEURONTIN) 100 MG capsule Take 100 mg by mouth 3 (three) times daily.   Yes [provider]  insulin NPH-regular Human (NOVOLIN 70/30) (70-30) 100 UNIT/ML injection Inject 40 Units into the skin 2 (two) times daily with a meal. Except dialysis days. Do not take on Tuesdays, Thursdays, and Saturdays.   Yes [provider]  ipratropium-albuterol (DUONEB) 0.5-2.5 (3) MG/3ML SOLN Take 3 mLs by nebulization every 6 (six) hours as needed (shortness of breath/ wheezing).    Yes [provider]  lidocaine-prilocaine (EMLA) cream Apply 1 application topically as needed (port access).    Yes [provider]  losartan (COZAAR) 100 MG tablet Take 100 mg by mouth daily. 09/12/17  Yes [provider]  metoprolol succinate (TOPROL-XL) 100 MG 24 hr tablet Take 100 mg by mouth every evening. Take with or immediately following a meal.    Yes [provider]  Nutritional Supplements (FEEDING SUPPLEMENT, NEPRO CARB STEADY,) LIQD Take 237 mLs by mouth 2 (two) times daily between meals. 07/01/17  Yes Wieting, Richard, MD  omeprazole (PRILOSEC) 20 MG capsule Take 20 mg by mouth 2 (two) times daily.    Yes [provider]  sevelamer carbonate (RENVELA) 800 MG tablet Take 2,400 mg by mouth 3 (three) times daily with meals.    Yes [provider]  torsemide (DEMADEX) 100 MG tablet Take 100 mg by mouth daily. 10/10/17  Yes [provider]   aspirin EC 325 MG tablet Take 1 tablet (325 mg total) by mouth daily. 11/13/17 12/13/17  Saundra Shelling, MD  budesonide-formoterol (SYMBICORT) 160-4.5 MCG/ACT inhaler Inhale 2 puffs into the lungs 2 (two) times daily. Patient not taking: Reported on 11/12/2017 07/01/17   Loletha Grayer, MD  cinacalcet (SENSIPAR) 60 MG tablet Take 60 mg by mouth daily.    [provider]    ROS: History obtained from the patient  General ROS: negative for - chills, fatigue, fever, night sweats, weight gain or weight loss Psychological ROS: negative for - behavioral disorder, hallucinations, memory difficulties, mood swings or suicidal ideation Ophthalmic ROS: negative for - blurry vision, double vision, eye pain or loss of vision ENT ROS: negative for - epistaxis, nasal discharge, oral lesions, sore throat, tinnitus or vertigo Allergy and Immunology ROS: negative for - hives or itchy/watery eyes Hematological and Lymphatic ROS: negative for - bleeding problems, bruising or swollen lymph nodes Endocrine ROS: negative for - galactorrhea, hair pattern changes, polydipsia/polyuria or temperature intolerance Respiratory ROS: negative for - cough, hemoptysis, shortness of breath or wheezing Cardiovascular ROS: negative for - chest pain, dyspnea on exertion, edema or irregular heartbeat Gastrointestinal ROS: negative for -  abdominal pain, diarrhea, hematemesis, nausea/vomiting or stool incontinence Genito-Urinary ROS: negative for - dysuria, hematuria, incontinence or urinary frequency/urgency Musculoskeletal ROS: negative for - joint swelling or muscular weakness Neurological ROS: as noted in HPI Dermatological ROS: negative for rash and skin lesion changes  Physical Examination: Blood pressure (!) 144/56, pulse 63, temperature 97.7 F (36.5 C), temperature source Oral, resp. rate 18, height 5\' 7"  (1.702 m), weight 97.5 kg (215 lb), SpO2 94 %.  HEENT-  Normocephalic, no lesions, without obvious  abnormality.  Normal external eye and conjunctiva.  Normal TM's bilaterally.  Normal auditory canals and external ears. Normal external nose, mucus membranes and septum.  Normal pharynx. Cardiovascular- S1, S2 normal, pulses palpable throughout   Lungs- chest clear, no wheezing, rales, normal symmetric air entry Abdomen- soft, non-tender; bowel sounds normal; no masses,  no organomegaly Extremities- no edema Lymph-no adenopathy palpable Musculoskeletal-no joint tenderness, deformity or swelling Skin-warm and dry, no hyperpigmentation, vitiligo, or suspicious lesions  Neurological Examination   Mental Status: Alert, oriented, thought content appropriate.  Speech fluent without evidence of aphasia.  Able to follow 3 step commands without difficulty. Cranial Nerves: II: Discs flat bilaterally; Visual fields grossly normal, pupils equal, round, reactive to light and accommodation III,IV, VI: ptosis not present, extra-ocular motions intact bilaterally V,VII: smile symmetric, facial light touch sensation normal bilaterally VIII: hearing normal bilaterally IX,X: gag reflex present XI: bilateral shoulder shrug XII: midline tongue extension Motor: Right : Upper extremity   5/5    Left:     Upper extremity   5/5  Lower extremity   5/5     Lower extremity   5/5 Tone and bulk:normal tone throughout; no atrophy noted Sensory: Pinprick and light touch intact throughout, bilaterally Deep Tendon Reflexes: 2+ and symmetric with absent AJ's bilaterally Plantars: Right: downgoing   Left: downgoing Cerebellar: normal finger-to-nose, normal rapid alternating movements and normal heel-to-shin test Gait: not tested due to safety concerns    Laboratory Studies:  Basic Metabolic Panel: Recent Labs  Lab 11/12/17 1250  NA 138  K 3.6  CL 100*  CO2 30  GLUCOSE 71  BUN 42*  CREATININE 8.57*  CALCIUM 8.4*    Liver Function Tests: Recent Labs  Lab 11/12/17 1250  AST 17  ALT 17  ALKPHOS 94   BILITOT 0.7  PROT 7.2  ALBUMIN 3.8   No results for input(s): LIPASE, AMYLASE in the last 168 hours. No results for input(s): AMMONIA in the last 168 hours.  CBC: Recent Labs  Lab 11/12/17 1250  WBC 6.8  NEUTROABS 3.7  HGB 11.4*  HCT 35.0  MCV 86.9  PLT 195    Cardiac Enzymes: Recent Labs  Lab 11/12/17 1250  TROPONINI <0.03    BNP: Invalid input(s): POCBNP  CBG: Recent Labs  Lab 11/12/17 2236 11/13/17 0819  GLUCAP 145* 198*    Microbiology: Results for orders placed or performed during the hospital encounter of 06/27/17  MRSA PCR Screening     Status: None   Collection Time: 06/27/17  6:10 PM  Result Value Ref Range Status   MRSA by PCR NEGATIVE NEGATIVE Final    Comment:        The GeneXpert MRSA Assay (FDA approved for NASAL specimens only), is one component of a comprehensive MRSA colonization surveillance program. It is not intended to diagnose MRSA infection nor to guide or monitor treatment for MRSA infections.     Coagulation Studies: Recent Labs    11/12/17 1250  LABPROT 11.8  INR  0.87    Urinalysis:  Recent Labs  Lab 11/12/17 2240  COLORURINE YELLOW*  LABSPEC 1.011  PHURINE 8.0  GLUCOSEU 50*  HGBUR SMALL*  BILIRUBINUR NEGATIVE  KETONESUR NEGATIVE  PROTEINUR 100*  NITRITE NEGATIVE  LEUKOCYTESUR TRACE*    Lipid Panel:    Component Value Date/Time   CHOL 90 11/13/2017 0407   CHOL 78 12/25/2013 0413   TRIG 169 (H) 11/13/2017 0407   TRIG 53 12/25/2013 0413   HDL 28 (L) 11/13/2017 0407   HDL 31 (L) 12/25/2013 0413   CHOLHDL 3.2 11/13/2017 0407   VLDL 34 11/13/2017 0407   VLDL 11 12/25/2013 0413   LDLCALC 28 11/13/2017 0407   LDLCALC 36 12/25/2013 0413    HgbA1C:  Lab Results  Component Value Date   HGBA1C 7.1 (H) 11/13/2017    Urine Drug Screen:  No results found for: LABOPIA, COCAINSCRNUR, LABBENZ, AMPHETMU, THCU, LABBARB  Alcohol Level: No results for input(s): ETH in the last 168 hours.  Other  results: EKG: normal sinus rhythm at 61 bpm  Imaging: Ct Head Wo Contrast  Result Date: 11/12/2017 CLINICAL DATA:  Onset of slurred speech this morning. EXAM: CT HEAD WITHOUT CONTRAST TECHNIQUE: Contiguous axial images were obtained from the base of the skull through the vertex without intravenous contrast. COMPARISON:  Brain MRI and head CT scan 06/27/2017. FINDINGS: Brain: No evidence of acute infarction, hemorrhage, hydrocephalus, extra-axial collection or mass lesion/mass effect. Right frontal and left parietal infarcts are unchanged. There is some atrophy and chronic microvascular ischemic disease. Vascular: Extensive atherosclerosis. Skull: Intact. Sinuses/Orbits: Negative. Other: None. IMPRESSION: No acute abnormality. Atrophy, chronic microvascular ischemic change and remote right frontal and left parietal infarcts, unchanged. Atherosclerosis. Electronically Signed   By: Inge Rise M.D.   On: 11/12/2017 13:22   Mr Angiogram Head Wo Contrast  Result Date: 11/12/2017 CLINICAL DATA:  Slurred speech and right facial droop. EXAM: MRA NECK WITHOUT CONTRAST MRA HEAD WITHOUT CONTRAST TECHNIQUE: Multiplanar and multiecho pulse sequences of the neck were obtained without intravenous contrast. Angiographic images of the neck were obtained using MRA technique without intravenous contrast.; Angiographic images of the Circle of Willis were obtained using MRA technique without intravenous contrast. COMPARISON:  Brain MRI 11/12/2017 FINDINGS: MRA NECK FINDINGS Time-of-flight MRA of the neck shows no hemodynamically significant stenosis of the vertebral or carotid arteries. Visualization of the proximal arteries is limited. MRA HEAD FINDINGS ANTERIOR CIRCULATION: --Intracranial internal carotid arteries: Normal. --Anterior cerebral arteries: Normal. Both A1 segments are present. Patent anterior communicating artery. --Middle cerebral arteries: Normal. --Posterior communicating arteries: Absent bilaterally.  POSTERIOR CIRCULATION: --Basilar artery: Normal. --Posterior cerebral arteries: Normal. --Superior cerebellar arteries: Normal. --Inferior cerebellar arteries: Normal anterior and posterior inferior cerebellar arteries. IMPRESSION: Normal MRA of the head and neck. Electronically Signed   By: Ulyses Jarred M.D.   On: 11/12/2017 23:10   Mr Jodene Nam Neck Wo Contrast  Result Date: 11/12/2017 CLINICAL DATA:  Slurred speech and right facial droop. EXAM: MRA NECK WITHOUT CONTRAST MRA HEAD WITHOUT CONTRAST TECHNIQUE: Multiplanar and multiecho pulse sequences of the neck were obtained without intravenous contrast. Angiographic images of the neck were obtained using MRA technique without intravenous contrast.; Angiographic images of the Circle of Willis were obtained using MRA technique without intravenous contrast. COMPARISON:  Brain MRI 11/12/2017 FINDINGS: MRA NECK FINDINGS Time-of-flight MRA of the neck shows no hemodynamically significant stenosis of the vertebral or carotid arteries. Visualization of the proximal arteries is limited. MRA HEAD FINDINGS ANTERIOR CIRCULATION: --Intracranial internal carotid arteries: Normal. --Anterior  cerebral arteries: Normal. Both A1 segments are present. Patent anterior communicating artery. --Middle cerebral arteries: Normal. --Posterior communicating arteries: Absent bilaterally. POSTERIOR CIRCULATION: --Basilar artery: Normal. --Posterior cerebral arteries: Normal. --Superior cerebellar arteries: Normal. --Inferior cerebellar arteries: Normal anterior and posterior inferior cerebellar arteries. IMPRESSION: Normal MRA of the head and neck. Electronically Signed   By: Ulyses Jarred M.D.   On: 11/12/2017 23:10   Mr Brain Wo Contrast  Result Date: 11/12/2017 CLINICAL DATA:  Slurred speech and facial droop. History of stroke, end-stage renal disease on dialysis, hypertension, hyperlipidemia, diabetes. EXAM: MRI HEAD WITHOUT CONTRAST TECHNIQUE: Multiplanar, multiecho pulse sequences of  the brain and surrounding structures were obtained without intravenous contrast. COMPARISON:  CT HEAD November 12, 2017 and MRI of the head June 27, 2017. FINDINGS: Mild motion degraded examination. INTRACRANIAL CONTENTS: Punctate reduced diffusion RIGHT thalamus, too small to localized on ADC map. No susceptibility artifact to suggest hemorrhage. Minimal susceptibility artifact associated with RIGHT frontal lobe encephalomalacia. LEFT temporoparietal lobe encephalomalacia. Old bilateral cerebellar infarcts. Old LEFT basal ganglia lacunar infarct with mild ex vacuo dilatation subjacent ventricle. Moderate parenchymal brain volume loss. No hydrocephalus. Patchy supratentorial and pontine white matter FLAIR T2 hyperintensities exclusive of the aforementioned abnormality. No midline shift, mass effect or masses. No abnormal extra-axial fluid collections. VASCULAR: Normal major intracranial vascular flow voids present at skull base. SKULL AND UPPER CERVICAL SPINE: No abnormal sellar expansion. No suspicious calvarial bone marrow signal. Craniocervical junction maintained. SINUSES/ORBITS: The mastoid air-cells and included paranasal sinuses are well-aerated.The included ocular globes and orbital contents are non-suspicious. OTHER: None. IMPRESSION: 1. Motion degraded examination. Punctate acute/subacute RIGHT thalamus infarct versus artifact. 2. Old RIGHT frontal (MCA territory) and LEFT temporoparietal (MCA territory) infarcts. 3. Moderate chronic small vessel ischemic disease. Old LEFT basal ganglia and cerebellar small infarcts. 4. Moderate parenchymal brain volume loss. Electronically Signed   By: Elon Alas M.D.   On: 11/12/2017 18:26    Assessment: 67 y.o. female with a history of stroke presenting with right facial droop and slurred speech that has resolved.  MRI shows a possible lesion in the right thalamus that is not consistent with the patient's presentation and is likely artifactual.  Had recent  stroke work up in December with unremarkable carotid dopplers and echocardiogram.  Patient on ASA and Plavix.  A1c 7.1, LDL 28.    Stroke Risk Factors - diabetes mellitus, hyperlipidemia and hypertension  Plan: 1. Carotid dopplers 2. Continue ASA and Plavix 3. If dopplers are unremarkable patient to be scheduled for LINQ placement as an outpatient.   4. PT/OT and speech therapy evaluations 5. Telemetry 6. Frequent neuro checks 7. Blood sugar management with target A1c<7.0  Case discussed with Dr. Laury Deep, MD Neurology [phone] 11/13/2017, 11:40 AM

## 2017-11-14 ENCOUNTER — Other Ambulatory Visit: Payer: Self-pay

## 2017-11-14 ENCOUNTER — Observation Stay
Admission: EM | Admit: 2017-11-14 | Discharge: 2017-11-17 | Disposition: A | Discharge status: Home / Self Care | Payer: Medicare HMO | Attending: Internal Medicine | Admitting: Internal Medicine

## 2017-11-14 ENCOUNTER — Emergency Department: Payer: Medicare HMO

## 2017-11-14 DIAGNOSIS — J449 Chronic obstructive pulmonary disease, unspecified: Secondary | ICD-10-CM | POA: Diagnosis present

## 2017-11-14 DIAGNOSIS — N186 End stage renal disease: Secondary | ICD-10-CM | POA: Diagnosis not present

## 2017-11-14 DIAGNOSIS — Z794 Long term (current) use of insulin: Secondary | ICD-10-CM | POA: Diagnosis not present

## 2017-11-14 DIAGNOSIS — R471 Dysarthria and anarthria: Secondary | ICD-10-CM | POA: Insufficient documentation

## 2017-11-14 DIAGNOSIS — E785 Hyperlipidemia, unspecified: Secondary | ICD-10-CM | POA: Diagnosis present

## 2017-11-14 DIAGNOSIS — Z7902 Long term (current) use of antithrombotics/antiplatelets: Secondary | ICD-10-CM | POA: Diagnosis not present

## 2017-11-14 DIAGNOSIS — R29810 Facial weakness: Secondary | ICD-10-CM | POA: Insufficient documentation

## 2017-11-14 DIAGNOSIS — E1122 Type 2 diabetes mellitus with diabetic chronic kidney disease: Secondary | ICD-10-CM | POA: Diagnosis not present

## 2017-11-14 DIAGNOSIS — N390 Urinary tract infection, site not specified: Secondary | ICD-10-CM | POA: Diagnosis not present

## 2017-11-14 DIAGNOSIS — Z7982 Long term (current) use of aspirin: Secondary | ICD-10-CM | POA: Diagnosis not present

## 2017-11-14 DIAGNOSIS — Z7951 Long term (current) use of inhaled steroids: Secondary | ICD-10-CM | POA: Diagnosis not present

## 2017-11-14 DIAGNOSIS — I132 Hypertensive heart and chronic kidney disease with heart failure and with stage 5 chronic kidney disease, or end stage renal disease: Secondary | ICD-10-CM | POA: Diagnosis not present

## 2017-11-14 DIAGNOSIS — Z8673 Personal history of transient ischemic attack (TIA), and cerebral infarction without residual deficits: Secondary | ICD-10-CM | POA: Diagnosis not present

## 2017-11-14 DIAGNOSIS — Z8249 Family history of ischemic heart disease and other diseases of the circulatory system: Secondary | ICD-10-CM | POA: Diagnosis not present

## 2017-11-14 DIAGNOSIS — I2510 Atherosclerotic heart disease of native coronary artery without angina pectoris: Secondary | ICD-10-CM | POA: Diagnosis present

## 2017-11-14 DIAGNOSIS — G459 Transient cerebral ischemic attack, unspecified: Secondary | ICD-10-CM | POA: Diagnosis not present

## 2017-11-14 DIAGNOSIS — I10 Essential (primary) hypertension: Secondary | ICD-10-CM | POA: Diagnosis present

## 2017-11-14 DIAGNOSIS — Z992 Dependence on renal dialysis: Secondary | ICD-10-CM | POA: Insufficient documentation

## 2017-11-14 DIAGNOSIS — R9431 Abnormal electrocardiogram [ECG] [EKG]: Secondary | ICD-10-CM | POA: Diagnosis present

## 2017-11-14 DIAGNOSIS — I5032 Chronic diastolic (congestive) heart failure: Secondary | ICD-10-CM | POA: Insufficient documentation

## 2017-11-14 DIAGNOSIS — K5900 Constipation, unspecified: Secondary | ICD-10-CM | POA: Insufficient documentation

## 2017-11-14 DIAGNOSIS — Z87891 Personal history of nicotine dependence: Secondary | ICD-10-CM | POA: Diagnosis not present

## 2017-11-14 DIAGNOSIS — Z6839 Body mass index (BMI) 39.0-39.9, adult: Secondary | ICD-10-CM | POA: Insufficient documentation

## 2017-11-14 DIAGNOSIS — R4781 Slurred speech: Secondary | ICD-10-CM | POA: Diagnosis present

## 2017-11-14 DIAGNOSIS — E119 Type 2 diabetes mellitus without complications: Secondary | ICD-10-CM

## 2017-11-14 LAB — CBC
HCT: 36.4 % (ref 35.0–47.0)
Hemoglobin: 12 g/dL (ref 12.0–16.0)
MCH: 28.1 pg (ref 26.0–34.0)
MCHC: 33 g/dL (ref 32.0–36.0)
MCV: 85.3 fL (ref 80.0–100.0)
PLATELETS: 197 10*3/uL (ref 150–440)
RBC: 4.27 MIL/uL (ref 3.80–5.20)
RDW: 16.1 % — AB (ref 11.5–14.5)
WBC: 6.6 10*3/uL (ref 3.6–11.0)

## 2017-11-14 LAB — PROTIME-INR
INR: 0.85
PROTHROMBIN TIME: 11.5 s (ref 11.4–15.2)

## 2017-11-14 LAB — GLUCOSE, CAPILLARY: GLUCOSE-CAPILLARY: 110 mg/dL — AB (ref 65–99)

## 2017-11-14 LAB — DIFFERENTIAL
BASOS ABS: 0.1 10*3/uL (ref 0–0.1)
BASOS PCT: 1 %
Eosinophils Absolute: 0.2 10*3/uL (ref 0–0.7)
Eosinophils Relative: 2 %
LYMPHS PCT: 38 %
Lymphs Abs: 2.5 10*3/uL (ref 1.0–3.6)
Monocytes Absolute: 0.4 10*3/uL (ref 0.2–0.9)
Monocytes Relative: 7 %
NEUTROS ABS: 3.4 10*3/uL (ref 1.4–6.5)
Neutrophils Relative %: 52 %

## 2017-11-14 LAB — APTT: APTT: 30 s (ref 24–36)

## 2017-11-14 LAB — ETHANOL

## 2017-11-14 MED ORDER — ASPIRIN 81 MG PO CHEW
324.0000 mg | CHEWABLE_TABLET | Freq: Once | ORAL | Status: AC
  Administered 2017-11-14: 324 mg via ORAL
  Filled 2017-11-14: qty 4

## 2017-11-14 NOTE — ED Notes (Signed)
Spoke with Dr. Clearnce Hasten about intermittent symptoms of slurred speech x 2 days with admission for the same 2 days ago and instructed for code stroke to be activated.

## 2017-11-14 NOTE — ED Provider Notes (Signed)
[facility] Emergency Department Provider Note  ____________________________________________  Time seen: Approximately 11:06 PM  I have reviewed the triage vital signs and the nursing notes.   HISTORY  Chief Complaint No chief complaint on file.   HPI Cynthia Dean is a 67 y.o. female with a history of TIA on Plavix and aspirin, diabetes, CAD, ESRD on HD, hypertension, hyperlipidemia who presents for evaluation of dysarthria.  Patient was discharged from the hospital yesterday after being admitted for the same.  Patient had an MRI that did not show an acute stroke.  Today after dialysis at 3 PM she developed sudden onset of slurred speech.  That resolved.  Around 8 PM her friend noted that she had slurred speech again which prompted the visit to the emergency department.  At this time patient slurred speech has resolved.  No changes in vision, no facial droop, no unilateral weakness or numbness, no headache, no gait instability.  Patient reports that the 2 episodes happened after she had dialysis.  Chief Complaint: slurred speech  Quality: intermittent Severity: moderate Duration: for 2 days Timing: after HD Associated signs/symptoms: No numbness, facial droop, or visual changes    Past Medical History:  Diagnosis Date  . COPD (chronic obstructive pulmonary disease) ([place])   . Coronary artery disease   . Diabetes mellitus without complication ([place])   . [place]D [place])   . Heart murmur   . Hyperlipidemia   . Hypertension   . Stroke [facility])     Patient Active Problem List   Diagnosis Date Noted  . Slurred speech 11/14/2017  . Diabetes ([place]) 11/12/2017  . CAD (coronary artery disease) 11/12/2017  . COPD (chronic obstructive pulmonary disease) ([place]) 11/12/2017  . ESRD on dialysis ([place]) 11/12/2017  . Hematemesis 06/27/2017  . Stroke ([place]) 04/14/2016  . Essential hypertension, benign 11/27/2015  . Hyperlipidemia 11/27/2015  . Obesity  11/27/2015  . Prolonged Q-T interval on ECG 11/26/2015  . Intractable nausea and vomiting 08/08/2015    Past Surgical History:  Procedure Laterality Date  . A/V FISTULAGRAM Left 12/20/2016   Procedure: A/V Fistulagram;  Surgeon: Katha Cabal, MD;  Location: [place] [facility];  Service: Cardiovascular;  Laterality: Left;  . A/V SHUNT INTERVENTION N/A 12/20/2016   Procedure: A/V Shunt Intervention;  Surgeon: Katha Cabal, MD;  Location: [place] [facility];  Service: Cardiovascular;  Laterality: N/A;  . A/V SHUNTOGRAM Left 09/11/2017   Procedure: A/V SHUNTOGRAM;  Surgeon: Algernon Huxley, MD;  Location: [place] [facility];  Service: Cardiovascular;  Laterality: Left;  . BREAST BIOPSY Bilateral 07/19/00   neg  . PERIPHERAL VASCULAR CATHETERIZATION Left 02/02/2015   Procedure: A/V Shuntogram/Fistulagram;  Surgeon: Algernon Huxley, MD;  Location: [place] [facility];  Service: Cardiovascular;  Laterality: Left;  . PERIPHERAL VASCULAR CATHETERIZATION Left 02/02/2015   Procedure: A/V Shunt Intervention;  Surgeon: Algernon Huxley, MD;  Location: [place] [facility];  Service: Cardiovascular;  Laterality: Left;  . PERIPHERAL VASCULAR CATHETERIZATION Left 03/09/2015   Procedure: A/V Shuntogram/Fistulagram;  Surgeon: Algernon Huxley, MD;  Location: [place] [facility];  Service: Cardiovascular;  Laterality: Left;  . PERIPHERAL VASCULAR CATHETERIZATION N/A 03/09/2015   Procedure: A/V Shunt Intervention;  Surgeon: Algernon Huxley, MD;  Location: [place] [facility];  Service: Cardiovascular;  Laterality: N/A;  . PERIPHERAL VASCULAR CATHETERIZATION Left 04/17/2015   Procedure: Upper Extremity Angiography;  Surgeon: Algernon Huxley, MD;  Location: [place] [facility];  Service:  Cardiovascular;  Laterality: Left;  . PERIPHERAL VASCULAR CATHETERIZATION  04/17/2015   Procedure: Upper Extremity Intervention;  Surgeon: Algernon Huxley, MD;  Location: [place] [facility];  Service: Cardiovascular;;  . PERIPHERAL VASCULAR  CATHETERIZATION N/A 08/10/2015   Procedure: A/V Shuntogram/Fistulagram;  Surgeon: Algernon Huxley, MD;  Location: [place] [facility];  Service: Cardiovascular;  Laterality: N/A;  . PERIPHERAL VASCULAR CATHETERIZATION N/A 08/10/2015   Procedure: A/V Shunt Intervention;  Surgeon: Algernon Huxley, MD;  Location: [place] [facility];  Service: Cardiovascular;  Laterality: N/A;  . PERIPHERAL VASCULAR CATHETERIZATION Left 04/11/2016   Procedure: A/V Shuntogram/Fistulagram;  Surgeon: Algernon Huxley, MD;  Location: [place] [facility];  Service: Cardiovascular;  Laterality: Left;    Prior to Admission medications   Medication Sig Start Date End Date Taking? Authorizing Provider  albuterol-ipratropium (COMBIVENT) 18-103 MCG/ACT inhaler Inhale 2 puffs into the lungs 2 (two) times daily.   Yes [provider]  aspirin EC 325 MG tablet Take 1 tablet (325 mg total) by mouth daily. 11/13/17 12/13/17 Yes Pyreddy, Reatha Harps, MD  atorvastatin (LIPITOR) 80 MG tablet Take 80 mg by mouth every evening.    Yes [provider]  cloNIDine (CATAPRES - DOSED IN MG/24 HR) 0.2 mg/24hr patch Place 1 patch onto the skin once a week. 10/09/17  Yes [provider]  clopidogrel (PLAVIX) 75 MG tablet TAKE ONE TABLET BY MOUTH ONCE DAILY 06/14/16  Yes Stegmayer, Joelene Millin A, PA-C  gabapentin (NEURONTIN) 100 MG capsule Take 100 mg by mouth 3 (three) times daily.   Yes [provider]  insulin NPH-regular Human (NOVOLIN 70/30) (70-30) 100 UNIT/ML injection Inject 40 Units into the skin 2 (two) times daily with a meal. Except dialysis days. Do not take on Tuesdays, Thursdays, and Saturdays.   Yes [provider]  ipratropium-albuterol (DUONEB) 0.5-2.5 (3) MG/3ML SOLN Take 3 mLs by nebulization every 6 (six) hours as needed (shortness of breath/ wheezing).    Yes [provider]  losartan (COZAAR) 100 MG tablet Take 100 mg by mouth daily. 09/12/17  Yes [provider]  metoprolol succinate  (TOPROL-XL) 100 MG 24 hr tablet Take 100 mg by mouth every evening. Take with or immediately following a meal.    Yes [provider]  omeprazole (PRILOSEC) 20 MG capsule Take 20 mg by mouth 2 (two) times daily.    Yes [provider]  sevelamer carbonate (RENVELA) 800 MG tablet Take 2,400 mg by mouth 3 (three) times daily with meals.    Yes [provider]  torsemide (DEMADEX) 100 MG tablet Take 100 mg by mouth daily. 10/10/17  Yes [provider]  budesonide-formoterol (SYMBICORT) 160-4.5 MCG/ACT inhaler Inhale 2 puffs into the lungs 2 (two) times daily. Patient not taking: Reported on 11/12/2017 07/01/17   Loletha Grayer, MD  cinacalcet (SENSIPAR) 60 MG tablet Take 60 mg by mouth daily.    [provider]  lidocaine-prilocaine (EMLA) cream Apply 1 application topically as needed (port access).     [provider]  Nutritional Supplements (FEEDING SUPPLEMENT, NEPRO CARB STEADY,) LIQD Take 237 mLs by mouth 2 (two) times daily between meals. 07/01/17   Loletha Grayer, MD    Allergies Tape  Family History  Problem Relation Age of Onset  . Breast cancer Father 26  . Stroke Mother   . Heart attack Mother   . Heart Problems Sister   . Breast cancer Cousin 100       1 st cousin. Maternal  Social History Social History   Tobacco Use  . Smoking status: Former Smoker    Packs/day: 0.00    Types: Cigarettes  . Smokeless tobacco: Never Used  Substance Use Topics  . Alcohol use: No  . Drug use: No    Review of Systems  Constitutional: Negative for fever. Eyes: Negative for visual changes. ENT: Negative for sore throat. Neck: No neck pain  Cardiovascular: Negative for chest pain. Respiratory: Negative for shortness of breath. Gastrointestinal: Negative for abdominal pain, vomiting or diarrhea. Genitourinary: Negative for dysuria. Musculoskeletal: Negative for back pain. Skin: Negative for rash. Neurological: Negative for  headaches, weakness or numbness. + Slurred speech Psych: No SI or HI  ____________________________________________   PHYSICAL EXAM:  VITAL SIGNS: ED Triage Vitals [11/14/17 2113]  Enc Vitals Group     BP (!) 155/57     Pulse Rate 67     Resp 18     Temp 98 F (36.7 C)     Temp Source Oral     SpO2 97 %     Weight 250 lb (113.4 kg)     Height 5\' 7"  (1.702 m)     Head Circumference      Peak Flow      Pain Score      Pain Loc      Pain Edu?      Excl. in [place]?     Constitutional: Alert and oriented. Well appearing and in no apparent distress. HEENT:      Head: Normocephalic and atraumatic.         Eyes: Conjunctivae are normal. Sclera is non-icteric.       Mouth/Throat: Mucous membranes are moist.       Neck: Supple with no signs of meningismus. Cardiovascular: Regular rate and rhythm. No murmurs, gallops, or rubs. 2+ symmetrical distal pulses are present in all extremities. No JVD. Respiratory: Normal respiratory effort. Lungs are clear to auscultation bilaterally. No wheezes, crackles, or rhonchi.  Gastrointestinal: Soft, non tender, and non distended with positive bowel sounds. No rebound or guarding. Musculoskeletal: Nontender with normal range of motion in all extremities. No edema, cyanosis, or erythema of extremities. Neurologic: A & O x3, PERRL, EOMI, no nystagmus, CN II-XII intact, motor testing reveals good tone and bulk throughout. There is no evidence of pronator drift or dysmetria. Muscle strength is 5/5 throughout.  Sensory examination is intact. Gait is normal.  Slight slurred speech Skin: Skin is warm, dry and intact. No rash noted. Psychiatric: Mood and affect are normal. Speech and behavior are normal.  ____________________________________________   LABS (all labs ordered are listed, but only abnormal results are displayed)  Labs Reviewed  CBC - Abnormal; Notable for the following components:      Result Value   RDW 16.1 (*)    All other components  within normal limits  GLUCOSE, CAPILLARY - Abnormal; Notable for the following components:   Glucose-Capillary 110 (*)    All other components within normal limits  ETHANOL  PROTIME-INR  APTT  DIFFERENTIAL  URINE DRUG SCREEN, QUALITATIVE ([facility] ONLY)  URINALYSIS, ROUTINE W REFLEX MICROSCOPIC  COMPREHENSIVE METABOLIC PANEL  TROPONIN I   ____________________________________________  EKG  ED ECG REPORT I, Rudene Re, the attending physician, personally viewed and interpreted this ECG.   Normal sinus rhythm, rate of 65, normal intervals, normal axis, ST depressions on 1, aVL, V3 to V6 with no ST elevations.  These depressions are new when compared to prior from 2 days ago. ____________________________________________  RADIOLOGY  I have personally reviewed the images performed during this visit and I agree with the Radiologist's read.   Interpretation by Radiologist:  Ct Head Code Stroke Wo Contrast`  Result Date: 11/14/2017 CLINICAL DATA:  Code stroke. 67 y/o F; sudden onset slurred speech after dialysis. EXAM: CT HEAD WITHOUT CONTRAST TECHNIQUE: Contiguous axial images were obtained from the base of the skull through the vertex without intravenous contrast. COMPARISON:  11/12/2017 MRI of the head and CT of the head. FINDINGS: Brain: No evidence of acute infarction, hemorrhage, hydrocephalus, extra-axial collection or mass lesion/mass effect. Stable cortical infarctions within the right anterolateral frontal lobe and left parietal lobe. Stable very small chronic infarctions within the right cerebellar hemisphere. Stable small chronic lacunar infarctions within bilateral basal ganglia. Stable chronic microvascular ischemic changes of white matter and parenchymal volume loss of the brain. Vascular: Calcific atherosclerosis of carotid siphons. No hyperdense vessel identified. Skull: Normal. Negative for fracture or focal lesion. Sinuses/Orbits: No acute finding. Other: None. ASPECTS  [facility] Stroke Program Early CT Score) - Ganglionic level infarction (caudate, lentiform nuclei, internal capsule, insula, M1-M3 cortex): 7 - Supraganglionic infarction (M4-M6 cortex): 3 Total score (0-10 with 10 being normal): 10 IMPRESSION: 1. No acute intracranial abnormality identified. 2. Stable chronic microvascular ischemic changes of the brain, parenchymal volume loss, and chronic infarctions. 3. ASPECTS is 10 These results were called by telephone at the time of interpretation on 11/14/2017 at 9:32 pm to Dr. Alfred Levins, who verbally acknowledged these results. Electronically Signed   By: Kristine Garbe M.D.   On: 11/14/2017 21:36    ____________________________________________   PROCEDURES  Procedure(s) performed: None Procedures Critical Care performed:  None ____________________________________________   INITIAL IMPRESSION / ASSESSMENT AND PLAN / ED COURSE  67 y.o. female with a history of TIA on Plavix and aspirin, diabetes, CAD, ESRD on HD, hypertension, hyperlipidemia who presents for evaluation of dysarthria.  Patient has been having intermittent episodes for the last 2 days.  Underwent MRI of the head and neck, carotid ultrasound yesterday which showed no acute findings.  Today after dialysis patient had 2 other episodes of slurred speech.  At this time she has very mild slurred speech with an NIH of 1.  She was evaluated by neurology who recommended admission for repeat MRI and if that is negative workup for other diagnosis such as myasthenia gravis.  Repeat CT was negative.  She was given a full dose of aspirin.  Patient does have a abnormal EKG which is new for her showing ST depressions on the lateral leads with no ST elevation.  She has no chest pain.  Troponin is pending.Discussed with Dr. Jannifer Franklin for admission.      As part of my medical decision making, I reviewed the following data within the [facility] notes reviewed and incorporated,  Labs reviewed , EKG interpreted , Old EKG reviewed, Old chart reviewed, Radiograph reviewed , Discussed with admitting physician , Notes from prior ED visits and [address] Controlled Substance Database    Pertinent labs & imaging results that were available during my care of the patient were reviewed by me and considered in my medical decision making (see chart for details).    ____________________________________________   FINAL CLINICAL IMPRESSION(S) / ED DIAGNOSES  Final diagnoses:  TIA (transient ischemic attack)  Abnormal EKG      NEW MEDICATIONS STARTED DURING THIS VISIT:  ED Discharge Orders    None       Note:  This document was  prepared using Systems analyst and may include unintentional dictation errors.    Alfred Levins, [place], MD 11/15/17 [phone]

## 2017-11-14 NOTE — ED Notes (Signed)
RN called teleneuro service to talk to neurologist. Ed MD has not received a call in regards to the patient. Estill Bamberg assisted this RN to confirm the neurologist and confirm the call back number for the ED MD.

## 2017-11-14 NOTE — Consult Note (Signed)
[facility] TeleNeurology Consult Services   Asked to see this patient in telemedicine consultation. Consultation was performed with assistance of ancillary / medical staff at bedside. ?   Verbal consent to perform the examination with telemedicine was obtained. Patient and family agreed to proceed with the consultation.   Impression: Dysarthria  Recurrent/persistent process  No stroke on MRI 2 days ago when admitted  Mild dysarthria on eval today NIHSS 1  No other weakness  No other bulbar or muscular symptoms  Differential includes stroke, MG, other neuromuscular process  Not a tpa candidate due to:  Time (LKW today at 1700, maybe even earlier than that)  Not an NIR candidate due to:  No LVO symptoms  Differential Diagnosis:   Stroke/TIA  Myasthenia Gravis  Myopathy    Metrics:  Arrival time:  2108  [facility] contacted:  2128  [facility] at bedside:  2132  NIHSS assessment time:  2133  Recommendations:    Antiplatelet therapy with ASA + Plavix  DVT Prophylaxis  Dysphagia Screen  Inpatient diagnostic testing to consider includes:  Repeat MRI brain and if negative then CPK, Acetylcholine Receptor Abs, and referral for outpatient neurology eval  Inpatient neurology consultation  Discussed with ED MD     CC:  Stroke Alert  Date of Consult:  11/14/17    HPI:  Admitted earlier this week due to dysarthria  Seen by telestroke at that time, for an eval for what sounded like TIA  CT unremarkable, MRI brain with some motion artifact but only old stroke and no acute event  MRA head/neck unremarkable  D/C home on ASA + Plavix as main stroke prevention  States since then symptoms have been fluctuating  Only reports dysarthria  No diplopia, no dysphagia, no limb weakness  Noted worsening since 1700  Friend confirmed dysarthria at 2000, so back to ER  Arrived at 2108  Call to teleneuro at 2128  Connected at  2132  eval at 2133  NIHSS 1 for dysarthria  No tPA due to time  No NIR due to no LVO based symptoms   Past Medical History:  Diagnosis Date  . COPD (chronic obstructive pulmonary disease) ([place])   . Coronary artery disease   . Diabetes mellitus without complication ([place])   . ESRD on dialysis ([place])   . Heart murmur   . Hyperlipidemia   . Hypertension   . Stroke [facility])     Prior to Admission medications   Medication Sig Start Date End Date Taking? Authorizing Provider  albuterol-ipratropium (COMBIVENT) 18-103 MCG/ACT inhaler Inhale 2 puffs into the lungs 2 (two) times daily.   Yes [provider]  aspirin EC 325 MG tablet Take 1 tablet (325 mg total) by mouth daily. 11/13/17 12/13/17 Yes Pyreddy, Reatha Harps, MD  atorvastatin (LIPITOR) 80 MG tablet Take 80 mg by mouth every evening.    Yes [provider]  cloNIDine (CATAPRES - DOSED IN MG/24 HR) 0.2 mg/24hr patch Place 1 patch onto the skin once a week. 10/09/17  Yes [provider]  clopidogrel (PLAVIX) 75 MG tablet TAKE ONE TABLET BY MOUTH ONCE DAILY 06/14/16  Yes Stegmayer, Joelene Millin A, PA-C  gabapentin (NEURONTIN) 100 MG capsule Take 100 mg by mouth 3 (three) times daily.   Yes [provider]  insulin NPH-regular Human (NOVOLIN 70/30) (70-30) 100 UNIT/ML injection Inject 40 Units into the skin 2 (two) times daily with a meal. Except dialysis days. Do not take on Tuesdays, Thursdays, and Saturdays.   Yes [provider]  ipratropium-albuterol (DUONEB) 0.5-2.5 (3) MG/3ML SOLN Take 3 mLs by nebulization every 6 (six) hours as needed (shortness of breath/ wheezing).    Yes [provider]  losartan (COZAAR) 100 MG tablet Take 100 mg by mouth daily. 09/12/17  Yes [provider]  metoprolol succinate (TOPROL-XL) 100 MG 24 hr tablet Take 100 mg by mouth every evening. Take with or immediately following a meal.    Yes [provider]  omeprazole (PRILOSEC) 20 MG capsule Take  20 mg by mouth 2 (two) times daily.    Yes [provider]  sevelamer carbonate (RENVELA) 800 MG tablet Take 2,400 mg by mouth 3 (three) times daily with meals.    Yes [provider]  torsemide (DEMADEX) 100 MG tablet Take 100 mg by mouth daily. 10/10/17  Yes [provider]  budesonide-formoterol (SYMBICORT) 160-4.5 MCG/ACT inhaler Inhale 2 puffs into the lungs 2 (two) times daily. Patient not taking: Reported on 11/12/2017 07/01/17   Loletha Grayer, MD  cinacalcet (SENSIPAR) 60 MG tablet Take 60 mg by mouth daily.    [provider]  lidocaine-prilocaine (EMLA) cream Apply 1 application topically as needed (port access).     [provider]  Nutritional Supplements (FEEDING SUPPLEMENT, NEPRO CARB STEADY,) LIQD Take 237 mLs by mouth 2 (two) times daily between meals. 07/01/17   Loletha Grayer, MD     Allergies  Allergen Reactions  . Tape Itching    Skin Dermatitis/itching (tape adhesive)    Social History   Tobacco Use  . Smoking status: Former Smoker    Packs/day: 0.00    Types: Cigarettes  . Smokeless tobacco: Never Used  Substance Use Topics  . Alcohol use: No  . Drug use: No    Family History  Problem Relation Age of Onset  . Breast cancer Father 39  . Stroke Mother   . Heart attack Mother   . Heart Problems Sister   . Breast cancer Cousin 72       1 st cousin. Maternal         Physical Exam  Vitals:   11/14/17 2113 11/14/17 2137  BP: (!) 155/57 (!) 167/59  Pulse: 67 68  Resp: 18 20  Temp: 98 F (36.7 C)   SpO2: 97% 93%     NIHSS  LOC - 0  LOC questions - 0  LOC commands - 0  EOM - 0  VF - 0  Face - 0  Motor Upper Ext - 0  Motor Lower Ext - 0  Coordination - 0  Sensory - 0  Language - 0  Speech - 1  Extinction - 0  NIHSS total - 1   Lab/Data Review  CT Head - reviewed - no acute process      Medical Decision Making:  - Extensive number of diagnosis or management options  are considered above.   - Extensive amount of complex data reviewed.   - High risk of complication and/or morbidity or mortality are associated with differential diagnostic considerations above.  - There may be Uncertain outcome and increased probability of prolonged functional impairment or high probability of severe prolonged functional impairment associated with some of these differential diagnosis.  Medical Data Reviewed:  1.Data reviewed include clinical labs, radiology,  Medical Tests;   2.Tests results discussed w/performing or interpreting physician;   3.Obtaining/reviewing old medical records;  4.Obtaining case history from another source;  5.Independent review of image, tracing or specimen.

## 2017-11-14 NOTE — H&P (Signed)
[facility] NAME: Cynthia Dean    MR#:  [id number]  DATE OF BIRTH:  [birth year]  DATE OF ADMISSION:  11/14/2017  PRIMARY CARE PHYSICIAN: Lowella Bandy, MD   REQUESTING/REFERRING PHYSICIAN: Alfred Levins, MD  CHIEF COMPLAINT:  Slurred Speech  HISTORY OF PRESENT ILLNESS:  Cynthia Dean  is a 67 y.o. female who presents with an episode of slurred speech.  Patient was recently admitted here and had work-up for new small thalamic stroke.  She had a prior episode of stroke last year as well.  Today she was at dialysis and had a recurrent episode of slurred speech.  Here in the ED telemetry neurology saw the patient and recommended repeat MRI with follow-up with inpatient neurology.  Hospitalist were called for admission  PAST MEDICAL HISTORY:   Past Medical History:  Diagnosis Date  . COPD (chronic obstructive pulmonary disease) ([place])   . Coronary artery disease   . Diabetes mellitus without complication ([place])   . ESRD on dialysis ([place])   . Heart murmur   . Hyperlipidemia   . Hypertension   . Stroke [facility])      PAST SURGICAL HISTORY:   Past Surgical History:  Procedure Laterality Date  . A/V FISTULAGRAM Left 12/20/2016   Procedure: A/V Fistulagram;  Surgeon: Katha Cabal, MD;  Location: [place] [facility];  Service: Cardiovascular;  Laterality: Left;  . A/V SHUNT INTERVENTION N/A 12/20/2016   Procedure: A/V Shunt Intervention;  Surgeon: Katha Cabal, MD;  Location: [place] [facility];  Service: Cardiovascular;  Laterality: N/A;  . A/V SHUNTOGRAM Left 09/11/2017   Procedure: A/V SHUNTOGRAM;  Surgeon: Algernon Huxley, MD;  Location: [place] [facility];  Service: Cardiovascular;  Laterality: Left;  . BREAST BIOPSY Bilateral 07/19/00   neg  . PERIPHERAL VASCULAR CATHETERIZATION Left 02/02/2015   Procedure: A/V Shuntogram/Fistulagram;  Surgeon: Algernon Huxley, MD;  Location: [place] [facility];  Service:  Cardiovascular;  Laterality: Left;  . PERIPHERAL VASCULAR CATHETERIZATION Left 02/02/2015   Procedure: A/V Shunt Intervention;  Surgeon: Algernon Huxley, MD;  Location: [place] [facility];  Service: Cardiovascular;  Laterality: Left;  . PERIPHERAL VASCULAR CATHETERIZATION Left 03/09/2015   Procedure: A/V Shuntogram/Fistulagram;  Surgeon: Algernon Huxley, MD;  Location: [place] [facility];  Service: Cardiovascular;  Laterality: Left;  . PERIPHERAL VASCULAR CATHETERIZATION N/A 03/09/2015   Procedure: A/V Shunt Intervention;  Surgeon: Algernon Huxley, MD;  Location: [place] [facility];  Service: Cardiovascular;  Laterality: N/A;  . PERIPHERAL VASCULAR CATHETERIZATION Left 04/17/2015   Procedure: Upper Extremity Angiography;  Surgeon: Algernon Huxley, MD;  Location: [place] [facility];  Service: Cardiovascular;  Laterality: Left;  . PERIPHERAL VASCULAR CATHETERIZATION  04/17/2015   Procedure: Upper Extremity Intervention;  Surgeon: Algernon Huxley, MD;  Location: [place] [facility];  Service: Cardiovascular;;  . PERIPHERAL VASCULAR CATHETERIZATION N/A 08/10/2015   Procedure: A/V Shuntogram/Fistulagram;  Surgeon: Algernon Huxley, MD;  Location: [place] [facility];  Service: Cardiovascular;  Laterality: N/A;  . PERIPHERAL VASCULAR CATHETERIZATION N/A 08/10/2015   Procedure: A/V Shunt Intervention;  Surgeon: Algernon Huxley, MD;  Location: [place] [facility];  Service: Cardiovascular;  Laterality: N/A;  . PERIPHERAL VASCULAR CATHETERIZATION Left 04/11/2016   Procedure: A/V Shuntogram/Fistulagram;  Surgeon: Algernon Huxley, MD;  Location: [place] [facility];  Service: Cardiovascular;  Laterality: Left;     SOCIAL HISTORY:   Social History   Tobacco  Use  . Smoking status: Former Smoker    Packs/day: 0.00    Types: Cigarettes  . Smokeless tobacco: Never Used  Substance Use Topics  . Alcohol use: No     FAMILY HISTORY:   Family History  Problem Relation Age of Onset  . Breast cancer Father 37  . Stroke Mother    . Heart attack Mother   . Heart Problems Sister   . Breast cancer Cousin 37       1 st cousin. Maternal      DRUG ALLERGIES:   Allergies  Allergen Reactions  . Tape Itching    Skin Dermatitis/itching (tape adhesive)    MEDICATIONS AT HOME:   Prior to Admission medications   Medication Sig Start Date End Date Taking? Authorizing Provider  albuterol-ipratropium (COMBIVENT) 18-103 MCG/ACT inhaler Inhale 2 puffs into the lungs 2 (two) times daily.   Yes [provider]  aspirin EC 325 MG tablet Take 1 tablet (325 mg total) by mouth daily. 11/13/17 12/13/17 Yes Pyreddy, Reatha Harps, MD  atorvastatin (LIPITOR) 80 MG tablet Take 80 mg by mouth every evening.    Yes [provider]  cloNIDine (CATAPRES - DOSED IN MG/24 HR) 0.2 mg/24hr patch Place 1 patch onto the skin once a week. 10/09/17  Yes [provider]  clopidogrel (PLAVIX) 75 MG tablet TAKE ONE TABLET BY MOUTH ONCE DAILY 06/14/16  Yes Stegmayer, Joelene Millin A, PA-C  gabapentin (NEURONTIN) 100 MG capsule Take 100 mg by mouth 3 (three) times daily.   Yes [provider]  insulin NPH-regular Human (NOVOLIN 70/30) (70-30) 100 UNIT/ML injection Inject 40 Units into the skin 2 (two) times daily with a meal. Except dialysis days. Do not take on Tuesdays, Thursdays, and Saturdays.   Yes [provider]  ipratropium-albuterol (DUONEB) 0.5-2.5 (3) MG/3ML SOLN Take 3 mLs by nebulization every 6 (six) hours as needed (shortness of breath/ wheezing).    Yes [provider]  losartan (COZAAR) 100 MG tablet Take 100 mg by mouth daily. 09/12/17  Yes [provider]  metoprolol succinate (TOPROL-XL) 100 MG 24 hr tablet Take 100 mg by mouth every evening. Take with or immediately following a meal.    Yes [provider]  omeprazole (PRILOSEC) 20 MG capsule Take 20 mg by mouth 2 (two) times daily.    Yes [provider]  sevelamer carbonate (RENVELA) 800 MG tablet Take 2,400 mg by  mouth 3 (three) times daily with meals.    Yes [provider]  torsemide (DEMADEX) 100 MG tablet Take 100 mg by mouth daily. 10/10/17  Yes [provider]  budesonide-formoterol (SYMBICORT) 160-4.5 MCG/ACT inhaler Inhale 2 puffs into the lungs 2 (two) times daily. Patient not taking: Reported on 11/12/2017 07/01/17   Loletha Grayer, MD  cinacalcet (SENSIPAR) 60 MG tablet Take 60 mg by mouth daily.    [provider]  lidocaine-prilocaine (EMLA) cream Apply 1 application topically as needed (port access).     [provider]  Nutritional Supplements (FEEDING SUPPLEMENT, NEPRO CARB STEADY,) LIQD Take 237 mLs by mouth 2 (two) times daily between meals. 07/01/17   Loletha Grayer, MD    REVIEW OF SYSTEMS:  Review of Systems  Constitutional: Negative for chills, fever, malaise/fatigue and weight loss.  HENT: Negative for ear pain, hearing loss and tinnitus.   Eyes: Negative for blurred vision, double vision, pain and redness.  Respiratory: Negative for cough, hemoptysis and shortness of breath.   Cardiovascular: Negative for chest pain, palpitations, orthopnea  and leg swelling.  Gastrointestinal: Negative for abdominal pain, constipation, diarrhea, nausea and vomiting.  Genitourinary: Negative for dysuria, frequency and hematuria.  Musculoskeletal: Negative for back pain, joint pain and neck pain.  Skin:       No acne, rash, or lesions  Neurological: Positive for speech change. Negative for dizziness, tremors, focal weakness and weakness.  Endo/Heme/Allergies: Negative for polydipsia. Does not bruise/bleed easily.  Psychiatric/Behavioral: Negative for depression. The patient is not nervous/anxious and does not have insomnia.      VITAL SIGNS:   Vitals:   11/14/17 2113 11/14/17 2137 11/14/17 2200  BP: (!) 155/57 (!) 167/59 123/66  Pulse: 67 68 65  Resp: 18 20 18   Temp: 98 F (36.7 C)    TempSrc: Oral    SpO2: 97% 93% 97%  Weight: 113.4 kg (250 lb)     Height: 5\' 7"  (1.702 m)     Wt Readings from Last 3 Encounters:  11/14/17 113.4 kg (250 lb)  11/12/17 97.5 kg (215 lb)  09/11/17 115.2 kg (254 lb)    PHYSICAL EXAMINATION:  Physical Exam  Vitals reviewed. Constitutional: She is oriented to person, place, and time. She appears well-developed and well-nourished. No distress.  HENT:  Head: Normocephalic and atraumatic.  Mouth/Throat: Oropharynx is clear and moist.  Eyes: Pupils are equal, round, and reactive to light. Conjunctivae and EOM are normal. No scleral icterus.  Neck: Normal range of motion. Neck supple. No JVD present. No thyromegaly present.  Cardiovascular: Normal rate, regular rhythm and intact distal pulses. Exam reveals no gallop and no friction rub.  No murmur heard. Respiratory: Effort normal and breath sounds normal. No respiratory distress. She has no wheezes. She has no rales.  GI: Soft. Bowel sounds are normal. She exhibits no distension. There is no tenderness.  Musculoskeletal: Normal range of motion. She exhibits no edema.  No arthritis, no gout  Lymphadenopathy:    She has no cervical adenopathy.  Neurological: She is alert and oriented to person, place, and time. No cranial nerve deficit.  Neurologic: Cranial nerves II-XII intact, Sensation intact to light touch/pinprick, 5/5 strength in all extremities, no dysarthria, no aphasia, no dysphagia, memory intact, finger to nose testing showed no abnormality, no pronator drift  Skin: Skin is warm and dry. No rash noted. No erythema.  Psychiatric: She has a normal mood and affect. Her behavior is normal. Judgment and thought content normal.    LABORATORY PANEL:   CBC Recent Labs  Lab 11/14/17 2156  WBC 6.6  HGB 12.0  HCT 36.4  PLT 197   ------------------------------------------------------------------------------------------------------------------  Chemistries  Recent Labs  Lab 11/12/17 1250  NA 138  K 3.6  CL 100*  CO2 30  GLUCOSE 71  BUN 42*   CREATININE 8.57*  CALCIUM 8.4*  AST 17  ALT 17  ALKPHOS 94  BILITOT 0.7   ------------------------------------------------------------------------------------------------------------------  Cardiac Enzymes Recent Labs  Lab 11/12/17 1250  TROPONINI <0.03   ------------------------------------------------------------------------------------------------------------------  RADIOLOGY:  US Carotid Bilateral  Result Date: 11/13/2017 CLINICAL DATA:  TIA EXAM: BILATERAL CAROTID DUPLEX ULTRASOUND TECHNIQUE: Pearline Cables scale imaging, color Doppler and duplex ultrasound were performed of bilateral carotid and vertebral arteries in the neck. COMPARISON:  None. FINDINGS: Criteria: Quantification of carotid stenosis is based on velocity parameters that correlate the residual internal carotid diameter with NASCET-based stenosis levels, using the diameter of the distal internal carotid lumen as the denominator for stenosis measurement. The following velocity measurements were obtained: RIGHT ICA:  96 cm/sec CCA:  85 cm/sec  SYSTOLIC ICA/CCA RATIO:  1.1 DIASTOLIC ICA/CCA RATIO: ECA:  91 cm/sec LEFT ICA:  81 cm/sec CCA:  89 cm/sec SYSTOLIC ICA/CCA RATIO:  0.9 DIASTOLIC ICA/CCA RATIO: ECA:  100 cm/sec RIGHT CAROTID ARTERY: Mild scattered calcified plaque in the upper common carotid and bulb. Low resistance internal carotid Doppler pattern. RIGHT VERTEBRAL ARTERY:  Antegrade. LEFT CAROTID ARTERY: Minimal calcified plaque in the bulb. Low resistance internal carotid Doppler pattern. LEFT VERTEBRAL ARTERY:  Antegrade. IMPRESSION: Less than 50% stenosis in the right and left internal carotid arteries. Electronically Signed   By: Marybelle Killings M.D.   On: 11/13/2017 15:26   Ct Head Code Stroke Wo Contrast`  Result Date: 11/14/2017 CLINICAL DATA:  Code stroke. 67 y/o F; sudden onset slurred speech after dialysis. EXAM: CT HEAD WITHOUT CONTRAST TECHNIQUE: Contiguous axial images were obtained from the base of the skull  through the vertex without intravenous contrast. COMPARISON:  11/12/2017 MRI of the head and CT of the head. FINDINGS: Brain: No evidence of acute infarction, hemorrhage, hydrocephalus, extra-axial collection or mass lesion/mass effect. Stable cortical infarctions within the right anterolateral frontal lobe and left parietal lobe. Stable very small chronic infarctions within the right cerebellar hemisphere. Stable small chronic lacunar infarctions within bilateral basal ganglia. Stable chronic microvascular ischemic changes of white matter and parenchymal volume loss of the brain. Vascular: Calcific atherosclerosis of carotid siphons. No hyperdense vessel identified. Skull: Normal. Negative for fracture or focal lesion. Sinuses/Orbits: No acute finding. Other: None. ASPECTS [facility] Stroke Program Early CT Score) - Ganglionic level infarction (caudate, lentiform nuclei, internal capsule, insula, M1-M3 cortex): 7 - Supraganglionic infarction (M4-M6 cortex): 3 Total score (0-10 with 10 being normal): 10 IMPRESSION: 1. No acute intracranial abnormality identified. 2. Stable chronic microvascular ischemic changes of the brain, parenchymal volume loss, and chronic infarctions. 3. ASPECTS is 10 These results were called by telephone at the time of interpretation on 11/14/2017 at 9:32 pm to Dr. Alfred Levins, who verbally acknowledged these results. Electronically Signed   By: Kristine Garbe M.D.   On: 11/14/2017 21:36    EKG:   Orders placed or performed during the hospital encounter of 11/14/17  . ED EKG  . ED EKG    IMPRESSION AND PLAN:  Principal Problem:   Slurred speech -currently resolved, unclear etiology.  Prior MRI showed punctate thalamic stroke, which on radiology read read acute punctate stroke versus artifact.  Stroke was assumed that time given the patient's clinical presentation.  We will admit again with stroke admission order set with appropriate imaging and consults Active Problems:    Essential hypertension, benign -blood pressure currently stable, continue home meds   Diabetes (HCC) -sliding scale insulin with corresponding glucose checks   CAD (coronary artery disease) -continue home medications   COPD (chronic obstructive pulmonary disease) (HCC) -home dose inhalers   ESRD on dialysis [facility]) -patient completed dialysis session today.  If she stays longer than 24 hours she will need a nephrology consult for dialysis   Hyperlipidemia -continue home meds  Chart review performed and case discussed with ED provider. Labs, imaging and/or ECG reviewed by provider and discussed with patient/family. Management plans discussed with the patient and/or family.  DVT PROPHYLAXIS: SubQ heparin  GI PROPHYLAXIS: PPI  ADMISSION STATUS: Observation  CODE STATUS: Full Code Status History    Date Active Date Inactive Code Status Order ID Comments User Context   11/12/2017 2219 11/13/2017 1935 Full Code [id number]  Lance Coon, MD ED   04/14/2016 1330 04/14/2016 1700 Full Code [id number]  Bettey Costa, MD ED   08/08/2015 2257 08/09/2015 2054 Full Code [id number]  Lavetta Nielsen, Aaron Mose, MD ED   04/17/2015 1334 04/17/2015 1836 Full Code [id number]  Algernon Huxley, MD Inpatient      TOTAL TIME TAKING CARE OF THIS PATIENT: 40 minutes.   Nalla Purdy [place] 11/14/2017, 11:27 PM  [facility]  Office  [phone]  CC: Primary care physician; Lowella Bandy, MD  Note:  This document was prepared using Dragon voice recognition software and may include unintentional dictation errors.

## 2017-11-14 NOTE — ED Notes (Signed)
Per pt and multiple pt family members pt has had intermittent episodes of slurred speech for the past three days. Pt was admitted to hospital two days ago for same symptoms and discharged after symptoms improved. Pt verbalized that she no longer had slurred speech at discharge. Pt went to dialysis this morning and reports after returning home (at 16:00) pt noticed slurred speech had returned but LKW is not known for sure as family reports they did not see her until 20:00 tonight and noticed the slurred speech at that time. Pt currently on ASA and Plavix.   NIH of 1 sure to slurred speech. No other neuro changes or deficits noted at this time. Pt talking and laughing with family and does not appear to be in any distress at this time.

## 2017-11-15 ENCOUNTER — Observation Stay: Payer: Medicare HMO

## 2017-11-15 ENCOUNTER — Other Ambulatory Visit: Payer: Self-pay

## 2017-11-15 ENCOUNTER — Observation Stay (HOSPITAL_BASED_OUTPATIENT_CLINIC_OR_DEPARTMENT_OTHER): Payer: Medicare HMO

## 2017-11-15 DIAGNOSIS — G459 Transient cerebral ischemic attack, unspecified: Secondary | ICD-10-CM | POA: Diagnosis not present

## 2017-11-15 DIAGNOSIS — R4781 Slurred speech: Secondary | ICD-10-CM

## 2017-11-15 LAB — COMPREHENSIVE METABOLIC PANEL
ALBUMIN: 3.5 g/dL (ref 3.5–5.0)
ALT: 16 U/L (ref 14–54)
AST: 20 U/L (ref 15–41)
Alkaline Phosphatase: 108 U/L (ref 38–126)
Anion gap: 10 (ref 5–15)
BUN: 26 mg/dL — AB (ref 6–20)
CHLORIDE: 98 mmol/L — AB (ref 101–111)
CO2: 28 mmol/L (ref 22–32)
CREATININE: 6.66 mg/dL — AB (ref 0.44–1.00)
Calcium: 8.1 mg/dL — ABNORMAL LOW (ref 8.9–10.3)
GFR calc Af Amer: 7 mL/min — ABNORMAL LOW (ref >60)
GFR calc non Af Amer: 6 mL/min — ABNORMAL LOW (ref >60)
GLUCOSE: 164 mg/dL — AB (ref 65–99)
Potassium: 3.8 mmol/L (ref 3.5–5.1)
Sodium: 136 mmol/L (ref 135–145)
Total Bilirubin: 0.5 mg/dL (ref 0.3–1.2)
Total Protein: 7.2 g/dL (ref 6.5–8.1)

## 2017-11-15 LAB — GLUCOSE, CAPILLARY
GLUCOSE-CAPILLARY: 162 mg/dL — AB (ref 65–99)
GLUCOSE-CAPILLARY: 172 mg/dL — AB (ref 65–99)
GLUCOSE-CAPILLARY: 181 mg/dL — AB (ref 65–99)
Glucose-Capillary: 174 mg/dL — ABNORMAL HIGH (ref 65–99)
Glucose-Capillary: 142 mg/dL — ABNORMAL HIGH (ref 65–99)
Glucose-Capillary: 162 mg/dL — ABNORMAL HIGH (ref 65–99)

## 2017-11-15 LAB — URINALYSIS, ROUTINE W REFLEX MICROSCOPIC
Bilirubin Urine: NEGATIVE
GLUCOSE, UA: 50 mg/dL — AB
Ketones, ur: NEGATIVE mg/dL
Leukocytes, UA: NEGATIVE
NITRITE: NEGATIVE
PROTEIN: 100 mg/dL — AB
RBC / HPF: >50 RBC/hpf — ABNORMAL HIGH (ref 0–5)
Specific Gravity, Urine: 1.013 (ref 1.005–1.030)
pH: 8 (ref 5.0–8.0)

## 2017-11-15 LAB — URINALYSIS, COMPLETE (UACMP) WITH MICROSCOPIC
BILIRUBIN URINE: NEGATIVE
Glucose, UA: NEGATIVE mg/dL
Ketones, ur: NEGATIVE mg/dL
Nitrite: NEGATIVE
PH: 7 (ref 5.0–8.0)
Protein, ur: 100 mg/dL — AB
SPECIFIC GRAVITY, URINE: 1.011 (ref 1.005–1.030)

## 2017-11-15 LAB — URINE DRUG SCREEN, QUALITATIVE (ARMC ONLY)
Amphetamines, Ur Screen: NOT DETECTED
BARBITURATES, UR SCREEN: NOT DETECTED
Benzodiazepine, Ur Scrn: NOT DETECTED
CANNABINOID 50 NG, UR ~~LOC~~: POSITIVE — AB
COCAINE METABOLITE, UR ~~LOC~~: NOT DETECTED
MDMA (Ecstasy)Ur Screen: NOT DETECTED
Methadone Scn, Ur: NOT DETECTED
Opiate, Ur Screen: NOT DETECTED
Phencyclidine (PCP) Ur S: NOT DETECTED
TRICYCLIC, UR SCREEN: NOT DETECTED

## 2017-11-15 LAB — TSH: TSH: 1.229 u[IU]/mL (ref 0.350–4.500)

## 2017-11-15 LAB — TROPONIN I: Troponin I: <0.03 ng/mL (ref <0.03)

## 2017-11-15 MED ORDER — TORSEMIDE 20 MG PO TABS
100.0000 mg | ORAL_TABLET | Freq: Every day | ORAL | Status: DC
  Administered 2017-11-15 – 2017-11-17 (×3): 100 mg via ORAL
  Filled 2017-11-15: qty 1
  Filled 2017-11-15 (×2): qty 5
  Filled 2017-11-15: qty 1

## 2017-11-15 MED ORDER — CLOPIDOGREL BISULFATE 75 MG PO TABS
75.0000 mg | ORAL_TABLET | Freq: Every day | ORAL | Status: DC
  Administered 2017-11-15 – 2017-11-17 (×3): 75 mg via ORAL
  Filled 2017-11-15 (×3): qty 1

## 2017-11-15 MED ORDER — CINACALCET HCL 30 MG PO TABS
60.0000 mg | ORAL_TABLET | Freq: Every day | ORAL | Status: DC
  Administered 2017-11-15 – 2017-11-17 (×2): 60 mg via ORAL
  Filled 2017-11-15 (×3): qty 2

## 2017-11-15 MED ORDER — IPRATROPIUM-ALBUTEROL 0.5-2.5 (3) MG/3ML IN SOLN: 3.0000 mL | Freq: Four times a day (QID) | RESPIRATORY_TRACT | Status: DC | PRN

## 2017-11-15 MED ORDER — INSULIN ASPART 100 UNIT/ML ~~LOC~~ SOLN
0.0000 [IU] | Freq: Three times a day (TID) | SUBCUTANEOUS | Status: DC
  Administered 2017-11-15 (×3): 2 [IU] via SUBCUTANEOUS
  Administered 2017-11-16: 1 [IU] via SUBCUTANEOUS
  Administered 2017-11-16 – 2017-11-17 (×2): 2 [IU] via SUBCUTANEOUS
  Administered 2017-11-17: 13:00:00 1 [IU] via SUBCUTANEOUS
  Filled 2017-11-15 (×8): qty 1

## 2017-11-15 MED ORDER — PANTOPRAZOLE SODIUM 40 MG PO PACK
20.0000 mg | PACK | Freq: Two times a day (BID) | ORAL | Status: DC
  Administered 2017-11-15 – 2017-11-16 (×4): 20 mg via ORAL
  Filled 2017-11-15 (×5): qty 20

## 2017-11-15 MED ORDER — INSULIN ASPART 100 UNIT/ML ~~LOC~~ SOLN: 0.0000 [IU] | Freq: Every day | SUBCUTANEOUS | Status: DC

## 2017-11-15 MED ORDER — ACETAMINOPHEN 650 MG RE SUPP: 650.0000 mg | RECTAL | Status: DC | PRN

## 2017-11-15 MED ORDER — STROKE: EARLY STAGES OF RECOVERY BOOK
Freq: Once | Status: AC
  Administered 2017-11-15: 01:00:00

## 2017-11-15 MED ORDER — ATORVASTATIN CALCIUM 20 MG PO TABS
80.0000 mg | ORAL_TABLET | Freq: Every evening | ORAL | Status: DC
  Administered 2017-11-15 – 2017-11-16 (×2): 80 mg via ORAL
  Filled 2017-11-15 (×2): qty 4

## 2017-11-15 MED ORDER — ASPIRIN EC 325 MG PO TBEC
325.0000 mg | DELAYED_RELEASE_TABLET | Freq: Every day | ORAL | Status: DC
  Administered 2017-11-15 – 2017-11-17 (×3): 325 mg via ORAL
  Filled 2017-11-15 (×3): qty 1

## 2017-11-15 MED ORDER — METOPROLOL SUCCINATE ER 50 MG PO TB24
100.0000 mg | ORAL_TABLET | Freq: Every evening | ORAL | Status: DC
  Administered 2017-11-15 – 2017-11-16 (×2): 100 mg via ORAL
  Filled 2017-11-15 (×2): qty 2

## 2017-11-15 MED ORDER — SEVELAMER CARBONATE 800 MG PO TABS
2400.0000 mg | ORAL_TABLET | Freq: Three times a day (TID) | ORAL | Status: DC
  Administered 2017-11-15 – 2017-11-17 (×7): 2400 mg via ORAL
  Filled 2017-11-15 (×10): qty 3

## 2017-11-15 MED ORDER — CEPHALEXIN 500 MG PO CAPS
500.0000 mg | ORAL_CAPSULE | Freq: Two times a day (BID) | ORAL | Status: DC
  Administered 2017-11-15 – 2017-11-17 (×4): 500 mg via ORAL
  Filled 2017-11-15 (×4): qty 1

## 2017-11-15 MED ORDER — ACETAMINOPHEN 160 MG/5ML PO SOLN
650.0000 mg | ORAL | Status: DC | PRN
  Filled 2017-11-15: qty 20.3

## 2017-11-15 MED ORDER — GABAPENTIN 100 MG PO CAPS
100.0000 mg | ORAL_CAPSULE | Freq: Three times a day (TID) | ORAL | Status: DC
  Administered 2017-11-15 – 2017-11-17 (×5): 100 mg via ORAL
  Filled 2017-11-15 (×5): qty 1

## 2017-11-15 MED ORDER — ACETAMINOPHEN 325 MG PO TABS: 650.0000 mg | ORAL_TABLET | ORAL | Status: DC | PRN

## 2017-11-15 MED ORDER — HEPARIN SODIUM (PORCINE) 5000 UNIT/ML IJ SOLN
5000.0000 [IU] | Freq: Three times a day (TID) | INTRAMUSCULAR | Status: DC
  Administered 2017-11-15 – 2017-11-17 (×5): 5000 [IU] via SUBCUTANEOUS
  Filled 2017-11-15 (×7): qty 1

## 2017-11-15 NOTE — Progress Notes (Signed)
[facility]  ROUNDING NOTE   Subjective:   Cynthia Dean readmitted for TIA (transient ischemic attack) [G45.9] Abnormal EKG [R94.31]  Patient's husband at bedside.   Hemodialysis treatment yesterday. Tolerated treatment well.   Objective:  Vital signs in last 24 hours:  Temp:  [98 F (36.7 C)-98.6 F (37 C)] 98.2 F (36.8 C) (04/24 1324) Pulse Rate:  [58-72] 58 (04/24 1324) Resp:  [16-20] 16 (04/24 1324) BP: (123-176)/(49-77) 176/66 (04/24 1324) SpO2:  [91 %-97 %] 94 % (04/24 1324) Weight:  [113.4 kg (250 lb)] 113.4 kg (250 lb) (04/23 10/28/2111)  Weight change:  Filed Weights   11/14/17 10-28-2111  Weight: 113.4 kg (250 lb)    Intake/Output: No intake/output data recorded.   Intake/Output this shift:  Total I/O In: 360 [P.O.:360] Out: -   Physical Exam: General: NAD,   Head: Normocephalic, atraumatic. Moist oral mucosal membranes  Eyes: Anicteric, PERRL  Neck: Supple, trachea midline  Lungs:  Clear to auscultation  Heart: Regular rate and rhythm  Abdomen:  Soft, nontender,   Extremities: no peripheral edema.  Neurologic: Right facial droop  Skin: No lesions  Access: Left AVG    Basic Metabolic Panel: Recent Labs  Lab 11/12/17 1250 11/14/17 2345  NA 138 136  K 3.6 3.8  CL 100* 98*  CO2 30 28  GLUCOSE 71 164*  BUN 42* 26*  CREATININE 8.57* 6.66*  CALCIUM 8.4* 8.1*    Liver Function Tests: Recent Labs  Lab 11/12/17 1250 11/14/17 2345  AST 17 20  ALT 17 16  ALKPHOS 94 108  BILITOT 0.7 0.5  PROT 7.2 7.2  ALBUMIN 3.8 3.5   No results for input(s): LIPASE, AMYLASE in the last 168 hours. No results for input(s): AMMONIA in the last 168 hours.  CBC: Recent Labs  Lab 11/12/17 1250 11/14/17 2156  WBC 6.8 6.6  NEUTROABS 3.7 3.4  HGB 11.4* 12.0  HCT 35.0 36.4  MCV 86.9 85.3  PLT 195 197    Cardiac Enzymes: Recent Labs  Lab 11/12/17 1250 11/14/17 2345  TROPONINI <0.03 <0.03    BNP: Invalid input(s): POCBNP  CBG: Recent  Labs  Lab 11/14/17 October 27, 2129 11/15/17 0051 11/15/17 0906 11/15/17 1328 11/15/17 1345  GLUCAP 110* 162* 174* 142* 162*    Microbiology: Results for orders placed or performed during the hospital encounter of 06/27/17  MRSA PCR Screening     Status: None   Collection Time: 06/27/17  6:10 PM  Result Value Ref Range Status   MRSA by PCR NEGATIVE NEGATIVE Final    Comment:        The GeneXpert MRSA Assay (FDA approved for NASAL specimens only), is one component of a comprehensive MRSA colonization surveillance program. It is not intended to diagnose MRSA infection nor to guide or monitor treatment for MRSA infections.     Coagulation Studies: Recent Labs    11/14/17 Oct 28, 2154  LABPROT 11.5  INR 0.85    Urinalysis: Recent Labs    11/14/17 10/28/2154 11/15/17 0801  COLORURINE YELLOW* YELLOW*  LABSPEC 1.013 1.011  PHURINE 8.0 7.0  GLUCOSEU 50* NEGATIVE  HGBUR MODERATE* MODERATE*  BILIRUBINUR NEGATIVE NEGATIVE  KETONESUR NEGATIVE NEGATIVE  PROTEINUR 100* 100*  NITRITE NEGATIVE NEGATIVE  LEUKOCYTESUR NEGATIVE MODERATE*      Imaging: Mr Brain Wo Contrast  Result Date: 11/15/2017 CLINICAL DATA:  Slurred speech, recent thalamic stroke. History of end-stage renal disease on dialysis, hypertension, hyperlipidemia. EXAM: MRI HEAD WITHOUT CONTRAST TECHNIQUE: Multiplanar, multiecho pulse sequences of the brain and  surrounding structures were obtained without intravenous contrast. COMPARISON:  CT HEAD November 14, 2017 and MRI of the head November 12, 2017 FINDINGS: INTRACRANIAL CONTENTS: No reduced diffusion to suggest acute ischemia. Either normalized RIGHT thalamus punctate infarct versus artifact. Minimal susceptibility artifact associated with RIGHT frontal lobe encephalomalacia. LEFT temporoparietal lobe encephalomalacia. Old bilateral cerebellar infarcts. Old LEFT basal ganglia infarct with mild ex vacuo dilatation subjacent ventricle. Moderate parenchymal brain volume loss. No hydrocephalus.  Patchy supratentorial and pontine white matter FLAIR T2 hyperintensities exclusive of aforementioned abnormality. No midline shift, mass effect or masses. No abnormal extra-axial fluid collections. VASCULAR: Normal major intracranial vascular flow voids present at skull base. SKULL AND UPPER CERVICAL SPINE: No abnormal sellar expansion. No suspicious calvarial bone marrow signal. Craniocervical junction maintained. SINUSES/ORBITS: The mastoid air-cells and included paranasal sinuses are well-aerated. Atretic LEFT maxillary sinus seen with chronic sinusitis.The included ocular globes and orbital contents are non-suspicious. OTHER: None. IMPRESSION: 1. No acute intracranial process. 2. 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 3. Moderate chronic small vessel ischemic disease. Old small LEFT basal ganglia and cerebellar infarcts. 4. Moderate parenchymal brain volume loss. Electronically Signed   By: Elon Alas M.D.   On: 11/15/2017 02:34   US Carotid Bilateral  Result Date: 11/13/2017 CLINICAL DATA:  TIA EXAM: BILATERAL CAROTID DUPLEX ULTRASOUND TECHNIQUE: Pearline Cables scale imaging, color Doppler and duplex ultrasound were performed of bilateral carotid and vertebral arteries in the neck. COMPARISON:  None. FINDINGS: Criteria: Quantification of carotid stenosis is based on velocity parameters that correlate the residual internal carotid diameter with NASCET-based stenosis levels, using the diameter of the distal internal carotid lumen as the denominator for stenosis measurement. The following velocity measurements were obtained: RIGHT ICA:  96 cm/sec CCA:  85 cm/sec SYSTOLIC ICA/CCA RATIO:  1.1 DIASTOLIC ICA/CCA RATIO: ECA:  91 cm/sec LEFT ICA:  81 cm/sec CCA:  89 cm/sec SYSTOLIC ICA/CCA RATIO:  0.9 DIASTOLIC ICA/CCA RATIO: ECA:  100 cm/sec RIGHT CAROTID ARTERY: Mild scattered calcified plaque in the upper common carotid and bulb. Low resistance internal carotid Doppler pattern. RIGHT VERTEBRAL ARTERY:  Antegrade.  LEFT CAROTID ARTERY: Minimal calcified plaque in the bulb. Low resistance internal carotid Doppler pattern. LEFT VERTEBRAL ARTERY:  Antegrade. IMPRESSION: Less than 50% stenosis in the right and left internal carotid arteries. Electronically Signed   By: Marybelle Killings M.D.   On: 11/13/2017 15:26   Ct Head Code Stroke Wo Contrast`  Result Date: 11/14/2017 CLINICAL DATA:  Code stroke. 67 y/o F; sudden onset slurred speech after dialysis. EXAM: CT HEAD WITHOUT CONTRAST TECHNIQUE: Contiguous axial images were obtained from the base of the skull through the vertex without intravenous contrast. COMPARISON:  11/12/2017 MRI of the head and CT of the head. FINDINGS: Brain: No evidence of acute infarction, hemorrhage, hydrocephalus, extra-axial collection or mass lesion/mass effect. Stable cortical infarctions within the right anterolateral frontal lobe and left parietal lobe. Stable very small chronic infarctions within the right cerebellar hemisphere. Stable small chronic lacunar infarctions within bilateral basal ganglia. Stable chronic microvascular ischemic changes of white matter and parenchymal volume loss of the brain. Vascular: Calcific atherosclerosis of carotid siphons. No hyperdense vessel identified. Skull: Normal. Negative for fracture or focal lesion. Sinuses/Orbits: No acute finding. Other: None. ASPECTS [facility] Stroke Program Early CT Score) - Ganglionic level infarction (caudate, lentiform nuclei, internal capsule, insula, M1-M3 cortex): 7 - Supraganglionic infarction (M4-M6 cortex): 3 Total score (0-10 with 10 being normal): 10 IMPRESSION: 1. No acute intracranial abnormality identified. 2. Stable chronic microvascular ischemic changes of  the brain, parenchymal volume loss, and chronic infarctions. 3. ASPECTS is 10 These results were called by telephone at the time of interpretation on 11/14/2017 at 9:32 pm to Dr. Alfred Levins, who verbally acknowledged these results. Electronically Signed   By: Kristine Garbe M.D.   On: 11/14/2017 21:36     Medications:    . aspirin EC  325 mg Oral Daily  . atorvastatin  80 mg Oral QPM  . cephALEXin  500 mg Oral Q12H  . cinacalcet  60 mg Oral Q breakfast  . clopidogrel  75 mg Oral Daily  . gabapentin  100 mg Oral TID  . heparin  5,000 Units Subcutaneous Q8H  . insulin aspart  0-5 Units Subcutaneous QHS  . insulin aspart  0-9 Units Subcutaneous TID WC  . metoprolol succinate  100 mg Oral QPM  . pantoprazole sodium  20 mg Oral BID  . sevelamer carbonate  2,400 mg Oral TID WC  . torsemide  100 mg Oral Daily   acetaminophen **OR** acetaminophen (TYLENOL) oral liquid 160 mg/5 mL **OR** acetaminophen, ipratropium-albuterol  Assessment/ Plan:  Cynthia Dean is a 67 y.o. 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  End-stage renal disease, on hemodialysis 2.  Hypertension 3.  Anemia of chronic kidney disease 4.  Secondary hyperparathyroidism 5.  Diabetes type 2 with complications of neuropathy and retinopathy and CKD  Plan No acute indication for dialysis today. Will plan on dialysis tomorrow.  Hold EPO due to ischemic event.    LOS: 0 Cynthia Dean 4/24/20191:59 PM

## 2017-11-15 NOTE — Evaluation (Signed)
Speech Language Pathology Evaluation Patient Details Name: Cynthia Dean MRN: [id number] DOB: [birth year] Today's Date: 11/15/2017 Time: 6270-3500 SLP Time Calculation (min) (ACUTE ONLY): 60 min  Problem List:  Patient Active Problem List   Diagnosis Date Noted  . Slurred speech 11/14/2017  . Diabetes ([place]) 11/12/2017  . CAD (coronary artery disease) 11/12/2017  . COPD (chronic obstructive pulmonary disease) ([place]) 11/12/2017  . ESRD on dialysis ([name]) 11/12/2017  . Hematemesis 06/27/2017  . Stroke ([place]) 04/14/2016  . Essential hypertension, benign 11/27/2015  . Hyperlipidemia 11/27/2015  . Obesity 11/27/2015  . Prolonged Q-T interval on ECG 11/26/2015  . Intractable nausea and vomiting 08/08/2015   Past Medical History:  Past Medical History:  Diagnosis Date  . COPD (chronic obstructive pulmonary disease) ([place])   . Coronary artery disease   . Diabetes mellitus without complication ([place])   . ESRD on dialysis ([place])   . Heart murmur   . Hyperlipidemia   . Hypertension   . Stroke [facility])    Past Surgical History:  Past Surgical History:  Procedure Laterality Date  . A/V FISTULAGRAM Left 12/20/2016   Procedure: A/V Fistulagram;  Surgeon: Katha Cabal, MD;  Location: [place] [facility];  Service: Cardiovascular;  Laterality: Left;  . A/V SHUNT INTERVENTION N/A 12/20/2016   Procedure: A/V Shunt Intervention;  Surgeon: Katha Cabal, MD;  Location: [place] [facility];  Service: Cardiovascular;  Laterality: N/A;  . A/V SHUNTOGRAM Left 09/11/2017   Procedure: A/V SHUNTOGRAM;  Surgeon: Algernon Huxley, MD;  Location: [place] [facility];  Service: Cardiovascular;  Laterality: Left;  . BREAST BIOPSY Bilateral 07/19/00   neg  . PERIPHERAL VASCULAR CATHETERIZATION Left 02/02/2015   Procedure: A/V Shuntogram/Fistulagram;  Surgeon: Algernon Huxley, MD;  Location: [place] [facility];  Service: Cardiovascular;  Laterality: Left;  . PERIPHERAL VASCULAR CATHETERIZATION Left  02/02/2015   Procedure: A/V Shunt Intervention;  Surgeon: Algernon Huxley, MD;  Location: [place] [facility];  Service: Cardiovascular;  Laterality: Left;  . PERIPHERAL VASCULAR CATHETERIZATION Left 03/09/2015   Procedure: A/V Shuntogram/Fistulagram;  Surgeon: Algernon Huxley, MD;  Location: [place] [facility];  Service: Cardiovascular;  Laterality: Left;  . PERIPHERAL VASCULAR CATHETERIZATION N/A 03/09/2015   Procedure: A/V Shunt Intervention;  Surgeon: Algernon Huxley, MD;  Location: [place] [facility];  Service: Cardiovascular;  Laterality: N/A;  . PERIPHERAL VASCULAR CATHETERIZATION Left 04/17/2015   Procedure: Upper Extremity Angiography;  Surgeon: Algernon Huxley, MD;  Location: [place] [facility];  Service: Cardiovascular;  Laterality: Left;  . PERIPHERAL VASCULAR CATHETERIZATION  04/17/2015   Procedure: Upper Extremity Intervention;  Surgeon: Algernon Huxley, MD;  Location: [place] [facility];  Service: Cardiovascular;;  . PERIPHERAL VASCULAR CATHETERIZATION N/A 08/10/2015   Procedure: A/V Shuntogram/Fistulagram;  Surgeon: Algernon Huxley, MD;  Location: [place] [facility];  Service: Cardiovascular;  Laterality: N/A;  . PERIPHERAL VASCULAR CATHETERIZATION N/A 08/10/2015   Procedure: A/V Shunt Intervention;  Surgeon: Algernon Huxley, MD;  Location: [place] [facility];  Service: Cardiovascular;  Laterality: N/A;  . PERIPHERAL VASCULAR CATHETERIZATION Left 04/11/2016   Procedure: A/V Shuntogram/Fistulagram;  Surgeon: Algernon Huxley, MD;  Location: [place] [facility];  Service: Cardiovascular;  Laterality: Left;   HPI:  Pt  is a 67 y.o. female who presents with an episode of slurred speech.  Patient was recently admitted here earlier this weeek and had work-up for new small thalamic stroke.  She had a prior episode of stroke last  year as well(noted MRI revealing Moderate changes and Old RIGHT frontal (MCA territory) and LEFT temporoparietal (MCA territory) infarcts.  Today she was at dialysis and had a recurrent  episode of slurred speech.  Here in the ED, telemetry neurology saw the patient and recommended repeat MRI with follow-up with inpatient neurology.  Pt is awake, alert. Husband in room w/ pt.   Assessment / Plan / Recommendation Clinical Impression  Pt seen this morning for an informal screening of general Cognitive-communication skills, language abilities, and Dysarthria. Pt exhibited NO overt deficits in her Receptive or Expressive language skills, and no overt deficits were noted in general Cognitive tasks and reasoning. Pt asked, w/ appropriate awareness, "why this keeps coming and going on me?". Husband stated he felt her mouth was "twisted last nigh but now it's all better". Husband denied any worsening in her speech/communication this morning. During the OM exam and oral functioning, no lingual or labial weakness noted; strength/ROM [facility]. Pt stated she felt her speech became "tired sometimes". Currently at this eval, her speech was [facility]; articulation and intelligibility appropriate. No deficits were noted in phonation volume or breath support for speech. Education given on Dysarthria and strategies(slow rate, overarticulate, increase volume, and rest breaks) when she notices any "tiredness" to her speech and verbal communication - suspect this complaint/presentation could be related to her previous CVAs(Old RIGHT frontal (MCA territory) and LEFT temporoparietal (MCA territory infartcts), Moderate parenchymal brain volume loss, and the recent small, Punctate acute/subacute RIGHT thalamas infarct during MRI this admission and last. Pt will be following up w/ Neurology who may be able to provide more information to pt/Husband. Pt will f/u w/ PCP if any changes to her speech.     SLP Assessment  SLP Recommendation/Assessment: Patient does not need any further Speech Lanaguage Pathology Services(at this time)    Follow Up Recommendations  None(recommend f/u w/ PCP if anything new)    Frequency and  Duration (n/a)  (n/a)      SLP Evaluation Cognition  Overall Cognitive Status: Within Functional Limits for tasks assessed Arousal/Alertness: Awake/alert Orientation Level: Oriented X4 Attention: Focused;Sustained Focused Attention: Appears intact Sustained Attention: Appears intact Memory: Appears intact Awareness: Appears intact Problem Solving: Appears intact Executive Function: (no deficits noted) Behaviors: (none) Safety/Judgment: Appears intact       Comprehension  Auditory Comprehension Overall Auditory Comprehension: Appears within functional limits for tasks assessed Visual Recognition/Discrimination Discrimination: Not tested Reading Comprehension Reading Status: Not tested    Expression Expression Primary Mode of Expression: Verbal Verbal Expression Overall Verbal Expression: Appears within functional limits for tasks assessed Non-Verbal Means of Communication: Not applicable Other Verbal Expression Comments: she denied any dysarthria this morning Written Expression Dominant Hand: Right Written Expression: Not tested   Oral / Motor  Oral Motor/Sensory Function Overall Oral Motor/Sensory Function: Within functional limits Motor Speech Overall Motor Speech: Appears within functional limits for tasks assessed(at this time; Husband stated her mouth is "fine" this AM) Respiration: Within functional limits Phonation: Normal Resonance: Within functional limits Articulation: Within functional limitis Intelligibility: Intelligible Motor Planning: Witnin functional limits Motor Speech Errors: Not applicable Interfering Components: (none) Effective Techniques: Slow rate;Increased vocal intensity;Over-articulate(when she notices any "tiredness" to her speech)   [place], [place], CCC-SLP Cynthia Dean 11/15/2017, 1:06 PM

## 2017-11-15 NOTE — Care Management Obs Status (Signed)
[place] NOTIFICATION   Patient Details  Name: Cynthia Dean MRN: [id number] Date of Birth: 1950/08/09   Medicare Observation Status Notification Given:  Yes    Shelbie Ammons, RN 11/15/2017, 9:50 AM

## 2017-11-15 NOTE — Progress Notes (Signed)
Family Meeting Note  Advance Directive:yes  Today a meeting took place with the Patient.     The following clinical team members were present during this meeting:MD  The following were discussed:Patient's diagnosis: Recurrent slurry speech, diabetes metas, hypertension, hyperlipidemia, plan of care discussed in detail, Patient's progosis: Unable to determine and Goals for treatment: Full Code, healthcare power of attorney Mr. [name]  Additional follow-up to be provided: Hospitalist, neurologist  Time spent during discussion:17 min  Nicholes Mango, MD

## 2017-11-15 NOTE — Plan of Care (Signed)
Dr. Text to inform of high BP 176/66. No further orders at this time.

## 2017-11-15 NOTE — Progress Notes (Signed)
[place] [facility] [place] NAME: Cynthia Dean    MR#:  [id number]  DATE OF BIRTH:  [birth year]  SUBJECTIVE:  CHIEF COMPLAINT:  Speech is better  REVIEW OF SYSTEMS:  CONSTITUTIONAL: No fever, fatigue or weakness.  EYES: No blurred or double vision.  EARS, NOSE, AND THROAT: No tinnitus or ear pain.  RESPIRATORY: No cough, shortness of breath, wheezing or hemoptysis.  CARDIOVASCULAR: No chest pain, orthopnea, edema.  GASTROINTESTINAL: No nausea, vomiting, diarrhea or abdominal pain.  GENITOURINARY: No dysuria, hematuria.  ENDOCRINE: No polyuria, nocturia,  HEMATOLOGY: No anemia, easy bruising or bleeding SKIN: No rash or lesion. MUSCULOSKELETAL: No joint pain or arthritis.   NEUROLOGIC: No tingling, numbness, weakness.  PSYCHIATRY: No anxiety or depression.   DRUG ALLERGIES:   Allergies  Allergen Reactions  . Tape Itching    Skin Dermatitis/itching (tape adhesive)    VITALS:  Blood pressure (!) 176/66, pulse (!) 58, temperature 98.2 F (36.8 C), temperature source Oral, resp. rate 16, height 5\' 7"  (1.702 m), weight 113.4 kg (250 lb), SpO2 94 %.  PHYSICAL EXAMINATION:  GENERAL:  67 y.o.-year-old patient lying in the bed with no acute distress.  EYES: Pupils equal, round, reactive to light and accommodation. No scleral icterus. Extraocular muscles intact.  HEENT: Head atraumatic, normocephalic. Oropharynx and nasopharynx clear.  NECK:  Supple, no jugular venous distention. No thyroid enlargement, no tenderness.  LUNGS: Normal breath sounds bilaterally, no wheezing, rales,rhonchi or crepitation. No use of accessory muscles of respiration.  CARDIOVASCULAR: S1, S2 normal. No murmurs, rubs, or gallops.  ABDOMEN: Soft, nontender, nondistended. Bowel sounds present. No organomegaly or mass.  EXTREMITIES: No pedal edema, cyanosis, or clubbing.  NEUROLOGIC: Cranial nerves II through XII are intact. Muscle strength 5/5 in all extremities.  Sensation intact. Gait not checked.  PSYCHIATRIC: The patient is alert and oriented x 3.  SKIN: No obvious rash, lesion, or ulcer.    LABORATORY PANEL:   CBC Recent Labs  Lab 11/14/17 2156  WBC 6.6  HGB 12.0  HCT 36.4  PLT 197   ------------------------------------------------------------------------------------------------------------------  Chemistries  Recent Labs  Lab 11/14/17 2345  NA 136  K 3.8  CL 98*  CO2 28  GLUCOSE 164*  BUN 26*  CREATININE 6.66*  CALCIUM 8.1*  AST 20  ALT 16  ALKPHOS 108  BILITOT 0.5   ------------------------------------------------------------------------------------------------------------------  Cardiac Enzymes Recent Labs  Lab 11/14/17 2345  TROPONINI <0.03   ------------------------------------------------------------------------------------------------------------------  RADIOLOGY:  Mr Brain Wo Contrast  Result Date: 11/15/2017 CLINICAL DATA:  Slurred speech, recent thalamic stroke. History of end-stage renal disease on dialysis, hypertension, hyperlipidemia. EXAM: MRI HEAD WITHOUT CONTRAST TECHNIQUE: Multiplanar, multiecho pulse sequences of the brain and surrounding structures were obtained without intravenous contrast. COMPARISON:  CT HEAD November 14, 2017 and MRI of the head November 12, 2017 FINDINGS: INTRACRANIAL CONTENTS: No reduced diffusion to suggest acute ischemia. Either normalized RIGHT thalamus punctate infarct versus artifact. Minimal susceptibility artifact associated with RIGHT frontal lobe encephalomalacia. LEFT temporoparietal lobe encephalomalacia. Old bilateral cerebellar infarcts. Old LEFT basal ganglia infarct with mild ex vacuo dilatation subjacent ventricle. Moderate parenchymal brain volume loss. No hydrocephalus. Patchy supratentorial and pontine white matter FLAIR T2 hyperintensities exclusive of aforementioned abnormality. No midline shift, mass effect or masses. No abnormal extra-axial fluid collections.  VASCULAR: Normal major intracranial vascular flow voids present at skull base. SKULL AND UPPER CERVICAL SPINE: No abnormal sellar expansion. No suspicious calvarial bone marrow signal. Craniocervical junction maintained. SINUSES/ORBITS: The mastoid air-cells  and included paranasal sinuses are well-aerated. Atretic LEFT maxillary sinus seen with chronic sinusitis.The included ocular globes and orbital contents are non-suspicious. OTHER: None. IMPRESSION: 1. No acute intracranial process. 2. 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 3. Moderate chronic small vessel ischemic disease. Old small LEFT basal ganglia and cerebellar infarcts. 4. Moderate parenchymal brain volume loss. Electronically Signed   By: Elon Alas M.D.   On: 11/15/2017 02:34   Ct Head Code Stroke Wo Contrast`  Result Date: 11/14/2017 CLINICAL DATA:  Code stroke. 67 y/o F; sudden onset slurred speech after dialysis. EXAM: CT HEAD WITHOUT CONTRAST TECHNIQUE: Contiguous axial images were obtained from the base of the skull through the vertex without intravenous contrast. COMPARISON:  11/12/2017 MRI of the head and CT of the head. FINDINGS: Brain: No evidence of acute infarction, hemorrhage, hydrocephalus, extra-axial collection or mass lesion/mass effect. Stable cortical infarctions within the right anterolateral frontal lobe and left parietal lobe. Stable very small chronic infarctions within the right cerebellar hemisphere. Stable small chronic lacunar infarctions within bilateral basal ganglia. Stable chronic microvascular ischemic changes of white matter and parenchymal volume loss of the brain. Vascular: Calcific atherosclerosis of carotid siphons. No hyperdense vessel identified. Skull: Normal. Negative for fracture or focal lesion. Sinuses/Orbits: No acute finding. Other: None. ASPECTS [facility] Stroke Program Early CT Score) - Ganglionic level infarction (caudate, lentiform nuclei, internal capsule, insula, M1-M3 cortex): 7 -  Supraganglionic infarction (M4-M6 cortex): 3 Total score (0-10 with 10 being normal): 10 IMPRESSION: 1. No acute intracranial abnormality identified. 2. Stable chronic microvascular ischemic changes of the brain, parenchymal volume loss, and chronic infarctions. 3. ASPECTS is 10 These results were called by telephone at the time of interpretation on 11/14/2017 at 9:32 pm to Dr. Alfred Levins, who verbally acknowledged these results. Electronically Signed   By: Kristine Garbe M.D.   On: 11/14/2017 21:36    EKG:   Orders placed or performed during the hospital encounter of 11/14/17  . ED EKG  . ED EKG    ASSESSMENT AND PLAN:   Recurrent dysarthria.   Prior MRI showed punctate thalamic stroke, which on radiology read read acute punctate stroke versus artifact.  Repeat MRI no acute abnormalities  continue aspirin and Plavix TEE done, op LINQ EEG Neurology is following  Essential hypertension, benign -blood pressure 06/2017 echo with bubble study with normal ejection fraction 55 to 65% Carotid Dopplers on April 20th with less than 50% stenosis currently stable Patient denies any anxiety or depression LDL 28, hemoglobin A1c 7.1   Hypertension continue metoprolol     Diabetes (HCC) -sliding scale insulin with corresponding glucose checks    CAD (coronary artery disease) -continue home medications    COPD (chronic obstructive pulmonary disease) (HCC) -no exacerbation continue inhalers as needed     ESRD on dialysis [facility]) -nephrology is following for dialysis    Hyperlipidemia -continue home med Lipitor       All the records are reviewed and case discussed with Care Management/Social Workerr. Management plans discussed with the patient, family and they are in agreement.  CODE STATUS: FC   TOTAL TIME TAKING CARE OF THIS PATIENT: 33  minutes.   POSSIBLE D/C IN 1  DAYS, DEPENDING ON CLINICAL CONDITION.  Note: This dictation was prepared with Dragon dictation along with  smaller phrase technology. Any transcriptional errors that result from this process are unintentional.   Nicholes Mango M.D on 11/15/2017 at 4:33 PM  Between 7am to 6pm - Pager - [phone] After 6pm go to [email or web address] -  password [email or web address] [facility]  [facility]  Office  [phone]  CC: Primary care physician; Lowella Bandy, MD

## 2017-11-15 NOTE — Progress Notes (Signed)
    [facility] has been requested to perform a transesophageal echocardiogram on [place] for TIA/recurrent stroke.  After careful review of history and examination, the risks and benefits of transesophageal echocardiogram have been explained including risks of esophageal damage, perforation (1:10,000 risk), bleeding, pharyngeal hematoma as well as other potential complications associated with conscious sedation including aspiration, arrhythmia, respiratory failure and death. Alternatives to treatment were discussed, questions were answered. Patient is willing to proceed.   Christell Faith, PA-C 11/15/2017 11:35 AM

## 2017-11-15 NOTE — Consult Note (Signed)
Referring Physician: Gouru    Chief Complaint: Slurred speech, right facial droop  HPI: Cynthia Dean is an 67 y.o. female with a history of stroke, recently admitted with TIA consisting of right facial droop and slurred speech.  Work up was unremarkable.  Patient scheduled for further outpatient testing.  On yesterday after dialysis had another episode of slurred speech and right facial droop.  Symptoms resolved spontaneously.  Initial NIHSS of 0.    Date last known well: Date: 11/14/2017 Time last known well: Time: 20:00 tPA Given: No: Resolution of symptoms  Past Medical History:  Diagnosis Date  . COPD (chronic obstructive pulmonary disease) ([place])   . Coronary artery disease   . Diabetes mellitus without complication ([place])   . ESRD on dialysis ([place])   . Heart murmur   . Hyperlipidemia   . Hypertension   . Stroke [facility])     Past Surgical History:  Procedure Laterality Date  . A/V FISTULAGRAM Left 12/20/2016   Procedure: A/V Fistulagram;  Surgeon: Katha Cabal, MD;  Location: [place] [facility];  Service: Cardiovascular;  Laterality: Left;  . A/V SHUNT INTERVENTION N/A 12/20/2016   Procedure: A/V Shunt Intervention;  Surgeon: Katha Cabal, MD;  Location: [place] [facility];  Service: Cardiovascular;  Laterality: N/A;  . A/V SHUNTOGRAM Left 09/11/2017   Procedure: A/V SHUNTOGRAM;  Surgeon: Algernon Huxley, MD;  Location: [place] [facility];  Service: Cardiovascular;  Laterality: Left;  . BREAST BIOPSY Bilateral 07/19/00   neg  . PERIPHERAL VASCULAR CATHETERIZATION Left 02/02/2015   Procedure: A/V Shuntogram/Fistulagram;  Surgeon: Algernon Huxley, MD;  Location: [place] [facility];  Service: Cardiovascular;  Laterality: Left;  . PERIPHERAL VASCULAR CATHETERIZATION Left 02/02/2015   Procedure: A/V Shunt Intervention;  Surgeon: Algernon Huxley, MD;  Location: [place] [facility];  Service: Cardiovascular;  Laterality: Left;  . PERIPHERAL VASCULAR CATHETERIZATION Left  03/09/2015   Procedure: A/V Shuntogram/Fistulagram;  Surgeon: Algernon Huxley, MD;  Location: [place] [facility];  Service: Cardiovascular;  Laterality: Left;  . PERIPHERAL VASCULAR CATHETERIZATION N/A 03/09/2015   Procedure: A/V Shunt Intervention;  Surgeon: Algernon Huxley, MD;  Location: [place] [facility];  Service: Cardiovascular;  Laterality: N/A;  . PERIPHERAL VASCULAR CATHETERIZATION Left 04/17/2015   Procedure: Upper Extremity Angiography;  Surgeon: Algernon Huxley, MD;  Location: [place] [facility];  Service: Cardiovascular;  Laterality: Left;  . PERIPHERAL VASCULAR CATHETERIZATION  04/17/2015   Procedure: Upper Extremity Intervention;  Surgeon: Algernon Huxley, MD;  Location: [place] [facility];  Service: Cardiovascular;;  . PERIPHERAL VASCULAR CATHETERIZATION N/A 08/10/2015   Procedure: A/V Shuntogram/Fistulagram;  Surgeon: Algernon Huxley, MD;  Location: [place] [facility];  Service: Cardiovascular;  Laterality: N/A;  . PERIPHERAL VASCULAR CATHETERIZATION N/A 08/10/2015   Procedure: A/V Shunt Intervention;  Surgeon: Algernon Huxley, MD;  Location: [place] [facility];  Service: Cardiovascular;  Laterality: N/A;  . PERIPHERAL VASCULAR CATHETERIZATION Left 04/11/2016   Procedure: A/V Shuntogram/Fistulagram;  Surgeon: Algernon Huxley, MD;  Location: [place] [facility];  Service: Cardiovascular;  Laterality: Left;    Family History  Problem Relation Age of Onset  . Breast cancer Father 38  . Stroke Mother   . Heart attack Mother   . Heart Problems Sister   . Breast cancer Cousin 53       1 st cousin. Maternal    Social History:  reports that she has quit smoking. Her smoking use included cigarettes.  She smoked 0.00 packs per day. She has never used smokeless tobacco. She reports that she does not drink alcohol or use drugs.  Allergies:  Allergies  Allergen Reactions  . Tape Itching    Skin Dermatitis/itching (tape adhesive)    Medications:  I have reviewed the patient's current  medications. Prior to Admission:  Medications Prior to Admission  Medication Sig Dispense Refill Last Dose  . albuterol-ipratropium (COMBIVENT) 18-103 MCG/ACT inhaler Inhale 2 puffs into the lungs 2 (two) times daily.   11/14/2017 at Unknown time  . aspirin EC 325 MG tablet Take 1 tablet (325 mg total) by mouth daily. 30 tablet 3 11/14/2017 at Unknown time  . atorvastatin (LIPITOR) 80 MG tablet Take 80 mg by mouth every evening.    11/13/2017 at Unknown time  . cloNIDine (CATAPRES - DOSED IN MG/24 HR) 0.2 mg/24hr patch Place 1 patch onto the skin once a week.   Past Week at Unknown time  . clopidogrel (PLAVIX) 75 MG tablet TAKE ONE TABLET BY MOUTH ONCE DAILY 30 tablet 5 11/14/2017 at Unknown time  . gabapentin (NEURONTIN) 100 MG capsule Take 100 mg by mouth 3 (three) times daily.   11/14/2017 at Unknown time  . insulin NPH-regular Human (NOVOLIN 70/30) (70-30) 100 UNIT/ML injection Inject 40 Units into the skin 2 (two) times daily with a meal. Except dialysis days. Do not take on Tuesdays, Thursdays, and Saturdays.   11/14/2017 at Unknown time  . ipratropium-albuterol (DUONEB) 0.5-2.5 (3) MG/3ML SOLN Take 3 mLs by nebulization every 6 (six) hours as needed (shortness of breath/ wheezing).    prn  . losartan (COZAAR) 100 MG tablet Take 100 mg by mouth daily.   11/14/2017 at Unknown time  . metoprolol succinate (TOPROL-XL) 100 MG 24 hr tablet Take 100 mg by mouth every evening. Take with or immediately following a meal.    11/14/2017 at Unknown time  . omeprazole (PRILOSEC) 20 MG capsule Take 20 mg by mouth 2 (two) times daily.    11/14/2017 at Unknown time  . sevelamer carbonate (RENVELA) 800 MG tablet Take 2,400 mg by mouth 3 (three) times daily with meals.    11/14/2017 at Unknown time  . torsemide (DEMADEX) 100 MG tablet Take 100 mg by mouth daily.   11/14/2017 at Unknown time  . budesonide-formoterol (SYMBICORT) 160-4.5 MCG/ACT inhaler Inhale 2 puffs into the lungs 2 (two) times daily. (Patient not taking:  Reported on 11/12/2017) 1 Inhaler 12 Not Taking at Unknown time  . cinacalcet (SENSIPAR) 60 MG tablet Take 60 mg by mouth daily.   Not Taking at Unknown time  . lidocaine-prilocaine (EMLA) cream Apply 1 application topically as needed (port access).    PRN at PRN  . Nutritional Supplements (FEEDING SUPPLEMENT, NEPRO CARB STEADY,) LIQD Take 237 mLs by mouth 2 (two) times daily between meals. 60 Can 0 UNKNOWN at UNKNOWN   Scheduled: . aspirin EC  325 mg Oral Daily  . atorvastatin  80 mg Oral QPM  . cephALEXin  500 mg Oral Q12H  . cinacalcet  60 mg Oral Q breakfast  . clopidogrel  75 mg Oral Daily  . gabapentin  100 mg Oral TID  . heparin  5,000 Units Subcutaneous Q8H  . insulin aspart  0-5 Units Subcutaneous QHS  . insulin aspart  0-9 Units Subcutaneous TID WC  . metoprolol succinate  100 mg Oral QPM  . pantoprazole sodium  20 mg Oral BID  . sevelamer carbonate  2,400 mg Oral TID WC  .  torsemide  100 mg Oral Daily    ROS: History obtained from the patient  General ROS: negative for - chills, fatigue, fever, night sweats, weight gain or weight loss Psychological ROS: negative for - behavioral disorder, hallucinations, memory difficulties, mood swings or suicidal ideation Ophthalmic ROS: negative for - blurry vision, double vision, eye pain or loss of vision ENT ROS: negative for - epistaxis, nasal discharge, oral lesions, sore throat, tinnitus or vertigo Allergy and Immunology ROS: negative for - hives or itchy/watery eyes Hematological and Lymphatic ROS: negative for - bleeding problems, bruising or swollen lymph nodes Endocrine ROS: negative for - galactorrhea, hair pattern changes, polydipsia/polyuria or temperature intolerance Respiratory ROS: negative for - cough, hemoptysis, shortness of breath or wheezing Cardiovascular ROS: negative for - chest pain, dyspnea on exertion, edema or irregular heartbeat Gastrointestinal ROS: negative for - abdominal pain, diarrhea, hematemesis,  nausea/vomiting or stool incontinence Genito-Urinary ROS: negative for - dysuria, hematuria, incontinence or urinary frequency/urgency Musculoskeletal ROS: negative for - joint swelling or muscular weakness Neurological ROS: as noted in HPI Dermatological ROS: negative for rash and skin lesion changes  Physical Examination: Blood pressure (!) 146/65, pulse 63, temperature 98 F (36.7 C), temperature source Oral, resp. rate 16, height 5\' 7"  (1.702 m), weight 113.4 kg (250 lb), SpO2 96 %.  HEENT-  Normocephalic, no lesions, without obvious abnormality.  Normal external eye and conjunctiva.  Normal TM's bilaterally.  Normal auditory canals and external ears. Normal external nose, mucus membranes and septum.  Normal pharynx. Cardiovascular- S1, S2 normal, pulses palpable throughout   Lungs- chest clear, no wheezing, rales, normal symmetric air entry Abdomen- soft, non-tender; bowel sounds normal; no masses,  no organomegaly Extremities- no edema Lymph-no adenopathy palpable Musculoskeletal-no joint tenderness, deformity or swelling Skin-warm and dry, no hyperpigmentation, vitiligo, or suspicious lesions  Neurological Examination  Mental Status: Alert, oriented, thought content appropriate.  Speech fluent without evidence of aphasia.  Able to follow 3 step commands without difficulty. Cranial Nerves: II: Discs flat bilaterally; Visual fields grossly normal, pupils equal, round, reactive to light and accommodation III,IV, VI: ptosis not present, extra-ocular motions intact bilaterally V,VII: smile symmetric, facial light touch sensation normal bilaterally VIII: hearing normal bilaterally IX,X: gag reflex present XI: bilateral shoulder shrug XII: midline tongue extension Motor: Right :  Upper extremity   5/5                                      Left:     Upper extremity   5/5             Lower extremity   5/5                                                  Lower extremity   5/5 Tone and  bulk:normal tone throughout; no atrophy noted Sensory: Pinprick and light touch intact throughout, bilaterally Deep Tendon Reflexes: 2+ and symmetric with absent AJ's bilaterally Plantars: Right: downgoing                                Left: downgoing Cerebellar: normal finger-to-nose, normal rapid alternating movements and normal heel-to-shin test Gait: not tested due to safety concerns  Laboratory Studies:  Basic Metabolic Panel: Recent Labs  Lab 11/12/17 1250 11/14/17 2345  NA 138 136  K 3.6 3.8  CL 100* 98*  CO2 30 28  GLUCOSE 71 164*  BUN 42* 26*  CREATININE 8.57* 6.66*  CALCIUM 8.4* 8.1*    Liver Function Tests: Recent Labs  Lab 11/12/17 1250 11/14/17 2345  AST 17 20  ALT 17 16  ALKPHOS 94 108  BILITOT 0.7 0.5  PROT 7.2 7.2  ALBUMIN 3.8 3.5   No results for input(s): LIPASE, AMYLASE in the last 168 hours. No results for input(s): AMMONIA in the last 168 hours.  CBC: Recent Labs  Lab 11/12/17 1250 11/14/17 2156  WBC 6.8 6.6  NEUTROABS 3.7 3.4  HGB 11.4* 12.0  HCT 35.0 36.4  MCV 86.9 85.3  PLT 195 197    Cardiac Enzymes: Recent Labs  Lab 11/12/17 1250 11/14/17 2345  TROPONINI <0.03 <0.03    BNP: Invalid input(s): POCBNP  CBG: Recent Labs  Lab 11/13/17 0819 11/13/17 1228 11/14/17 2131 11/15/17 0051 11/15/17 0906  GLUCAP 198* 199* 110* 162* 174*    Microbiology: Results for orders placed or performed during the hospital encounter of 06/27/17  MRSA PCR Screening     Status: None   Collection Time: 06/27/17  6:10 PM  Result Value Ref Range Status   MRSA by PCR NEGATIVE NEGATIVE Final    Comment:        The GeneXpert MRSA Assay (FDA approved for NASAL specimens only), is one component of a comprehensive MRSA colonization surveillance program. It is not intended to diagnose MRSA infection nor to guide or monitor treatment for MRSA infections.     Coagulation Studies: Recent Labs    11/12/17 1250 11/14/17 2156   LABPROT 11.8 11.5  INR 0.87 0.85    Urinalysis:  Recent Labs  Lab 11/14/17 2156 11/15/17 0801  COLORURINE YELLOW* YELLOW*  LABSPEC 1.013 1.011  PHURINE 8.0 7.0  GLUCOSEU 50* NEGATIVE  HGBUR MODERATE* MODERATE*  BILIRUBINUR NEGATIVE NEGATIVE  KETONESUR NEGATIVE NEGATIVE  PROTEINUR 100* 100*  NITRITE NEGATIVE NEGATIVE  LEUKOCYTESUR NEGATIVE MODERATE*    Lipid Panel:    Component Value Date/Time   CHOL 90 11/13/2017 0407   CHOL 78 12/25/2013 0413   TRIG 169 (H) 11/13/2017 0407   TRIG 53 12/25/2013 0413   HDL 28 (L) 11/13/2017 0407   HDL 31 (L) 12/25/2013 0413   CHOLHDL 3.2 11/13/2017 0407   VLDL 34 11/13/2017 0407   VLDL 11 12/25/2013 0413   LDLCALC 28 11/13/2017 0407   LDLCALC 36 12/25/2013 0413    HgbA1C:  Lab Results  Component Value Date   HGBA1C 7.1 (H) 11/13/2017    Urine Drug Screen:      Component Value Date/Time   LABOPIA NONE DETECTED 11/14/2017 2156   COCAINSCRNUR NONE DETECTED 11/14/2017 2156   LABBENZ NONE DETECTED 11/14/2017 2156   AMPHETMU NONE DETECTED 11/14/2017 2156   THCU POSITIVE (A) 11/14/2017 2156   LABBARB NONE DETECTED 11/14/2017 2156    Alcohol Level:  Recent Labs  Lab 11/14/17 2156  ETH <10    Other results: EKG (11/12/2017): sinus rhythm at 67 bpm.  Imaging: Mr Brain Wo Contrast  Result Date: 11/15/2017 CLINICAL DATA:  Slurred speech, recent thalamic stroke. History of end-stage renal disease on dialysis, hypertension, hyperlipidemia. EXAM: MRI HEAD WITHOUT CONTRAST TECHNIQUE: Multiplanar, multiecho pulse sequences of the brain and surrounding structures were obtained without intravenous contrast. COMPARISON:  CT HEAD November 14, 2017 and MRI of the  head November 12, 2017 FINDINGS: INTRACRANIAL CONTENTS: No reduced diffusion to suggest acute ischemia. Either normalized RIGHT thalamus punctate infarct versus artifact. Minimal susceptibility artifact associated with RIGHT frontal lobe encephalomalacia. LEFT temporoparietal lobe  encephalomalacia. Old bilateral cerebellar infarcts. Old LEFT basal ganglia infarct with mild ex vacuo dilatation subjacent ventricle. Moderate parenchymal brain volume loss. No hydrocephalus. Patchy supratentorial and pontine white matter FLAIR T2 hyperintensities exclusive of aforementioned abnormality. No midline shift, mass effect or masses. No abnormal extra-axial fluid collections. VASCULAR: Normal major intracranial vascular flow voids present at skull base. SKULL AND UPPER CERVICAL SPINE: No abnormal sellar expansion. No suspicious calvarial bone marrow signal. Craniocervical junction maintained. SINUSES/ORBITS: The mastoid air-cells and included paranasal sinuses are well-aerated. Atretic LEFT maxillary sinus seen with chronic sinusitis.The included ocular globes and orbital contents are non-suspicious. OTHER: None. IMPRESSION: 1. No acute intracranial process. 2. 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 3. Moderate chronic small vessel ischemic disease. Old small LEFT basal ganglia and cerebellar infarcts. 4. Moderate parenchymal brain volume loss. Electronically Signed   By: Elon Alas M.D.   On: 11/15/2017 02:34   US Carotid Bilateral  Result Date: 11/13/2017 CLINICAL DATA:  TIA EXAM: BILATERAL CAROTID DUPLEX ULTRASOUND TECHNIQUE: Pearline Cables scale imaging, color Doppler and duplex ultrasound were performed of bilateral carotid and vertebral arteries in the neck. COMPARISON:  None. FINDINGS: Criteria: Quantification of carotid stenosis is based on velocity parameters that correlate the residual internal carotid diameter with NASCET-based stenosis levels, using the diameter of the distal internal carotid lumen as the denominator for stenosis measurement. The following velocity measurements were obtained: RIGHT ICA:  96 cm/sec CCA:  85 cm/sec SYSTOLIC ICA/CCA RATIO:  1.1 DIASTOLIC ICA/CCA RATIO: ECA:  91 cm/sec LEFT ICA:  81 cm/sec CCA:  89 cm/sec SYSTOLIC ICA/CCA RATIO:  0.9 DIASTOLIC ICA/CCA RATIO: ECA:   100 cm/sec RIGHT CAROTID ARTERY: Mild scattered calcified plaque in the upper common carotid and bulb. Low resistance internal carotid Doppler pattern. RIGHT VERTEBRAL ARTERY:  Antegrade. LEFT CAROTID ARTERY: Minimal calcified plaque in the bulb. Low resistance internal carotid Doppler pattern. LEFT VERTEBRAL ARTERY:  Antegrade. IMPRESSION: Less than 50% stenosis in the right and left internal carotid arteries. Electronically Signed   By: Marybelle Killings M.D.   On: 11/13/2017 15:26   Ct Head Code Stroke Wo Contrast`  Result Date: 11/14/2017 CLINICAL DATA:  Code stroke. 67 y/o F; sudden onset slurred speech after dialysis. EXAM: CT HEAD WITHOUT CONTRAST TECHNIQUE: Contiguous axial images were obtained from the base of the skull through the vertex without intravenous contrast. COMPARISON:  11/12/2017 MRI of the head and CT of the head. FINDINGS: Brain: No evidence of acute infarction, hemorrhage, hydrocephalus, extra-axial collection or mass lesion/mass effect. Stable cortical infarctions within the right anterolateral frontal lobe and left parietal lobe. Stable very small chronic infarctions within the right cerebellar hemisphere. Stable small chronic lacunar infarctions within bilateral basal ganglia. Stable chronic microvascular ischemic changes of white matter and parenchymal volume loss of the brain. Vascular: Calcific atherosclerosis of carotid siphons. No hyperdense vessel identified. Skull: Normal. Negative for fracture or focal lesion. Sinuses/Orbits: No acute finding. Other: None. ASPECTS [facility] Stroke Program Early CT Score) - Ganglionic level infarction (caudate, lentiform nuclei, internal capsule, insula, M1-M3 cortex): 7 - Supraganglionic infarction (M4-M6 cortex): 3 Total score (0-10 with 10 being normal): 10 IMPRESSION: 1. No acute intracranial abnormality identified. 2. Stable chronic microvascular ischemic changes of the brain, parenchymal volume loss, and chronic infarctions. 3. ASPECTS is 10  These results were called by  telephone at the time of interpretation on 11/14/2017 at 9:32 pm to Dr. Alfred Levins, who verbally acknowledged these results. Electronically Signed   By: Kristine Garbe M.D.   On: 11/14/2017 21:36    Assessment: 67 y.o. female presenting with a recurrent episode of slurred speech and right facial droop.  TIA on the differential as well as seizure.  Patient on ASA and Plavix.  MRI of the brain repeated and reviewed.  No acute changes noted.  Would not repeat stroke work up at this time.    Stroke Risk Factors - diabetes mellitus, hyperlipidemia and hypertension  Plan: 1. TEE with LINQ placement if echo unremarkable 2. EEG 3. Frequent neuro checks 4. Continue ASA and Plavix  Alexis Goodell, MD Neurology [phone] 11/15/2017, 12:16 PM

## 2017-11-15 NOTE — Care Management (Signed)
Re-admitted under observation status with the diagnosis of slurred speech.  Discharged from this facility 11/13/17. Discharge diagnosis at that time was subacute right thalamus infarct,  Followed by [facility] in the past. Will have neuro-work-up Shelbie Ammons RN MSN CCM Care Management [phone]

## 2017-11-15 NOTE — ED Notes (Signed)
Patient transported to room 116.

## 2017-11-16 ENCOUNTER — Encounter: Payer: Self-pay | Admitting: Emergency Medicine

## 2017-11-16 ENCOUNTER — Observation Stay
Admit: 2017-11-16 | Discharge: 2017-11-16 | Disposition: A | Discharge status: Home / Self Care | Payer: Medicare HMO | Attending: Internal Medicine | Admitting: Internal Medicine

## 2017-11-16 ENCOUNTER — Encounter
Admission: EM | Disposition: A | Discharge status: Home / Self Care | Payer: Self-pay | Source: Home / Self Care | Attending: Emergency Medicine

## 2017-11-16 DIAGNOSIS — I6310 Cerebral infarction due to embolism of unspecified precerebral artery: Secondary | ICD-10-CM | POA: Diagnosis not present

## 2017-11-16 DIAGNOSIS — E1159 Type 2 diabetes mellitus with other circulatory complications: Secondary | ICD-10-CM | POA: Diagnosis not present

## 2017-11-16 DIAGNOSIS — G459 Transient cerebral ischemic attack, unspecified: Secondary | ICD-10-CM | POA: Diagnosis not present

## 2017-11-16 DIAGNOSIS — I10 Essential (primary) hypertension: Secondary | ICD-10-CM

## 2017-11-16 DIAGNOSIS — Z992 Dependence on renal dialysis: Secondary | ICD-10-CM

## 2017-11-16 DIAGNOSIS — I6389 Other cerebral infarction: Secondary | ICD-10-CM

## 2017-11-16 DIAGNOSIS — R4781 Slurred speech: Secondary | ICD-10-CM

## 2017-11-16 DIAGNOSIS — E782 Mixed hyperlipidemia: Secondary | ICD-10-CM

## 2017-11-16 DIAGNOSIS — I700 Atherosclerosis of aorta: Secondary | ICD-10-CM | POA: Diagnosis not present

## 2017-11-16 DIAGNOSIS — N186 End stage renal disease: Secondary | ICD-10-CM | POA: Diagnosis not present

## 2017-11-16 HISTORY — PX: TEE WITHOUT CARDIOVERSION: SHX5443

## 2017-11-16 HISTORY — PX: LOOP RECORDER INSERTION: EP1214

## 2017-11-16 LAB — GLUCOSE, CAPILLARY
GLUCOSE-CAPILLARY: 152 mg/dL — AB (ref 65–99)
GLUCOSE-CAPILLARY: 144 mg/dL — AB (ref 65–99)
Glucose-Capillary: 149 mg/dL — ABNORMAL HIGH (ref 65–99)
Glucose-Capillary: 146 mg/dL — ABNORMAL HIGH (ref 65–99)

## 2017-11-16 LAB — RENAL FUNCTION PANEL
ALBUMIN: 3.6 g/dL (ref 3.5–5.0)
Anion gap: 11 (ref 5–15)
BUN: 47 mg/dL — ABNORMAL HIGH (ref 6–20)
CALCIUM: 8.2 mg/dL — AB (ref 8.9–10.3)
CO2: 27 mmol/L (ref 22–32)
Chloride: 98 mmol/L — ABNORMAL LOW (ref 101–111)
Creatinine, Ser: 9.53 mg/dL — ABNORMAL HIGH (ref 0.44–1.00)
GFR, EST AFRICAN AMERICAN: 4 mL/min — AB (ref >60)
GFR, EST NON AFRICAN AMERICAN: 4 mL/min — AB (ref >60)
GLUCOSE: 177 mg/dL — AB (ref 65–99)
PHOSPHORUS: 4 mg/dL (ref 2.5–4.6)
POTASSIUM: 4.6 mmol/L (ref 3.5–5.1)
SODIUM: 136 mmol/L (ref 135–145)

## 2017-11-16 LAB — CBC
HCT: 35.8 % (ref 35.0–47.0)
Hemoglobin: 11.8 g/dL — ABNORMAL LOW (ref 12.0–16.0)
MCH: 28.6 pg (ref 26.0–34.0)
MCHC: 33.1 g/dL (ref 32.0–36.0)
MCV: 86.4 fL (ref 80.0–100.0)
Platelets: 170 10*3/uL (ref 150–440)
RBC: 4.14 MIL/uL (ref 3.80–5.20)
RDW: 15.5 % — ABNORMAL HIGH (ref 11.5–14.5)
WBC: 5.4 10*3/uL (ref 3.6–11.0)

## 2017-11-16 LAB — MRSA PCR SCREENING

## 2017-11-16 SURGERY — LOOP RECORDER INSERTION
Anesthesia: Moderate Sedation

## 2017-11-16 SURGERY — ECHOCARDIOGRAM, TRANSESOPHAGEAL
Anesthesia: Moderate Sedation

## 2017-11-16 MED ORDER — FENTANYL CITRATE (PF) 100 MCG/2ML IJ SOLN
INTRAMUSCULAR | Status: AC | PRN
  Administered 2017-11-16 (×3): 25 ug via INTRAVENOUS

## 2017-11-16 MED ORDER — LIDOCAINE-EPINEPHRINE (PF) 1 %-1:200000 IJ SOLN
INTRAMUSCULAR | Status: AC
  Filled 2017-11-16: qty 30

## 2017-11-16 MED ORDER — BUTAMBEN-TETRACAINE-BENZOCAINE 2-2-14 % EX AERO
INHALATION_SPRAY | CUTANEOUS | Status: AC
  Administered 2017-11-16: 3
  Filled 2017-11-16: qty 5

## 2017-11-16 MED ORDER — FENTANYL CITRATE (PF) 100 MCG/2ML IJ SOLN
INTRAMUSCULAR | Status: AC
  Filled 2017-11-16: qty 2

## 2017-11-16 MED ORDER — LIDOCAINE VISCOUS 2 % MT SOLN
OROMUCOSAL | Status: AC
  Administered 2017-11-16: 15 mL
  Filled 2017-11-16: qty 15

## 2017-11-16 MED ORDER — SODIUM CHLORIDE FLUSH 0.9 % IV SOLN
INTRAVENOUS | Status: AC
  Filled 2017-11-16: qty 10

## 2017-11-16 MED ORDER — MIDAZOLAM HCL 5 MG/5ML IJ SOLN
INTRAMUSCULAR | Status: AC
  Filled 2017-11-16: qty 5

## 2017-11-16 MED ORDER — SODIUM CHLORIDE 0.9 % IV SOLN
100.0000 mg | Freq: Once | INTRAVENOUS | Status: AC
  Administered 2017-11-16: 100 mg via INTRAVENOUS
  Filled 2017-11-16: qty 10

## 2017-11-16 MED ORDER — LACOSAMIDE 50 MG PO TABS
50.0000 mg | ORAL_TABLET | Freq: Two times a day (BID) | ORAL | Status: DC
  Administered 2017-11-17: 09:00:00 50 mg via ORAL
  Filled 2017-11-16: qty 1

## 2017-11-16 MED ORDER — MIDAZOLAM HCL 5 MG/5ML IJ SOLN
INTRAMUSCULAR | Status: AC | PRN
  Administered 2017-11-16 (×3): 1 mg via INTRAVENOUS

## 2017-11-16 SURGICAL SUPPLY — 2 items
LOOP REVEAL LINQSYS (Prosthesis & Implant Heart) ×2 IMPLANT
PACK LOOP INSERTION (CUSTOM PROCEDURE TRAY) ×2 IMPLANT

## 2017-11-16 NOTE — Progress Notes (Signed)
[facility]  ROUNDING NOTE   Subjective:  [name] is a 67 y.o. 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 She reports feeling good. No more weakness, slurred speech or facial droop reported.    Objective:  Vital signs in last 24 hours:  Temp:  [97.6 F (36.4 C)-98.3 F (36.8 C)] 98.3 F (36.8 C) (04/25 1146) Pulse Rate:  [58-78] 60 (04/25 1403) Resp:  [12-21] 18 (04/25 1403) BP: (132-191)/(45-106) 183/72 (04/25 1403) SpO2:  [88 %-99 %] 98 % (04/25 1403) Weight:  [113.4 kg (250 lb)] 113.4 kg (250 lb) (04/25 0805)  Weight change:  Filed Weights   11/14/17 2113 11/16/17 0805  Weight: 113.4 kg (250 lb) 113.4 kg (250 lb)    Intake/Output: I/O last 3 completed shifts: In: 27 [P.O.:720] Out: -    Intake/Output this shift:  Total I/O In: 240 [P.O.:240] Out: -   Physical Exam: General: NAD,   Head: Normocephalic, atraumatic. Moist oral mucosal membranes  Eyes: Anicteric, PERRL  Neck: Supple, trachea midline  Lungs:  Clear to auscultation  Heart: Regular rate and rhythm  Abdomen:  Soft, nontender,   Extremities:  No peripheral edema.  Neurologic: No focal deficits, no facial droop, strength intact , moving all four extremities  Skin: No lesions  Access: Left AVG     Basic Metabolic Panel: Recent Labs  Lab 11/12/17 1250 11/14/17 2345 11/16/17 1232  NA 138 136 136  K 3.6 3.8 4.6  CL 100* 98* 98*  CO2 30 28 27   GLUCOSE 71 164* 177*  BUN 42* 26* 47*  CREATININE 8.57* 6.66* 9.53*  CALCIUM 8.4* 8.1* 8.2*  PHOS  --   --  4.0    Liver Function Tests: Recent Labs  Lab 11/12/17 1250 11/14/17 2345  11/16/17 1232  AST 17 20  --   ALT 17 16  --   ALKPHOS 94 108  --   BILITOT 0.7 0.5  --   PROT 7.2 7.2  --   ALBUMIN 3.8 3.5 3.6   No results for input(s): LIPASE, AMYLASE in the last 168 hours. No results for input(s): AMMONIA in the last 168 hours.  CBC: Recent Labs  Lab 11/12/17 1250 11/14/17 2156 11/16/17 1232  WBC 6.8 6.6 5.4  NEUTROABS 3.7 3.4  --   HGB 11.4* 12.0 11.8*  HCT 35.0 36.4 35.8  MCV 86.9 85.3 86.4  PLT 195 197 170    Cardiac Enzymes: Recent Labs  Lab 11/12/17 1250 11/14/17 2345  TROPONINI <0.03 <0.03    BNP: Invalid input(s): POCBNP  CBG: Recent Labs  Lab 11/15/17 1345 11/15/17 1637 11/15/17 2017 11/16/17 0744 11/16/17 1146  GLUCAP 162* 181* 172* 149* 152*    Microbiology: Results for orders placed or performed during the hospital encounter of 06/27/17  MRSA PCR Screening     Status: None   Collection Time: 06/27/17  6:10 PM  Result Value Ref Range Status   MRSA by PCR NEGATIVE NEGATIVE Final    Comment:        The GeneXpert MRSA Assay (FDA approved for NASAL specimens only), is one component of a comprehensive MRSA colonization surveillance program. It is not intended to diagnose MRSA infection nor to guide or monitor treatment for MRSA infections.  Coagulation Studies: Recent Labs    11/14/17 2154-11-15  LABPROT 11.5  INR 0.85    Urinalysis: Recent Labs    11/14/17 Nov 15, 2154 11/15/17 0801  COLORURINE YELLOW* YELLOW*  LABSPEC 1.013 1.011  PHURINE 8.0 7.0  GLUCOSEU 50* NEGATIVE  HGBUR MODERATE* MODERATE*  BILIRUBINUR NEGATIVE NEGATIVE  KETONESUR NEGATIVE NEGATIVE  PROTEINUR 100* 100*  NITRITE NEGATIVE NEGATIVE  LEUKOCYTESUR NEGATIVE MODERATE*      Imaging: Mr Brain Wo Contrast  Result Date: 11/15/2017 CLINICAL DATA:  Slurred speech, recent thalamic stroke. History of end-stage renal disease on dialysis, hypertension, hyperlipidemia. EXAM: MRI HEAD WITHOUT CONTRAST TECHNIQUE: Multiplanar, multiecho pulse  sequences of the brain and surrounding structures were obtained without intravenous contrast. COMPARISON:  CT HEAD November 14, 2017 and MRI of the head November 12, 2017 FINDINGS: INTRACRANIAL CONTENTS: No reduced diffusion to suggest acute ischemia. Either normalized RIGHT thalamus punctate infarct versus artifact. Minimal susceptibility artifact associated with RIGHT frontal lobe encephalomalacia. LEFT temporoparietal lobe encephalomalacia. Old bilateral cerebellar infarcts. Old LEFT basal ganglia infarct with mild ex vacuo dilatation subjacent ventricle. Moderate parenchymal brain volume loss. No hydrocephalus. Patchy supratentorial and pontine white matter FLAIR T2 hyperintensities exclusive of aforementioned abnormality. No midline shift, mass effect or masses. No abnormal extra-axial fluid collections. VASCULAR: Normal major intracranial vascular flow voids present at skull base. SKULL AND UPPER CERVICAL SPINE: No abnormal sellar expansion. No suspicious calvarial bone marrow signal. Craniocervical junction maintained. SINUSES/ORBITS: The mastoid air-cells and included paranasal sinuses are well-aerated. Atretic LEFT maxillary sinus seen with chronic sinusitis.The included ocular globes and orbital contents are non-suspicious. OTHER: None. IMPRESSION: 1. No acute intracranial process. 2. 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 3. Moderate chronic small vessel ischemic disease. Old small LEFT basal ganglia and cerebellar infarcts. 4. Moderate parenchymal brain volume loss. Electronically Signed   By: Cynthia Dean M.D.   On: 11/15/2017 02:34   Ct Head Code Stroke Wo Contrast`  Result Date: 11/14/2017 CLINICAL DATA:  Code stroke. 67 y/o F; sudden onset slurred speech after dialysis. EXAM: CT HEAD WITHOUT CONTRAST TECHNIQUE: Contiguous axial images were obtained from the base of the skull through the vertex without intravenous contrast. COMPARISON:  11/12/2017 MRI of the head and CT of the head. FINDINGS: Brain:  No evidence of acute infarction, hemorrhage, hydrocephalus, extra-axial collection or mass lesion/mass effect. Stable cortical infarctions within the right anterolateral frontal lobe and left parietal lobe. Stable very small chronic infarctions within the right cerebellar hemisphere. Stable small chronic lacunar infarctions within bilateral basal ganglia. Stable chronic microvascular ischemic changes of white matter and parenchymal volume loss of the brain. Vascular: Calcific atherosclerosis of carotid siphons. No hyperdense vessel identified. Skull: Normal. Negative for fracture or focal lesion. Sinuses/Orbits: No acute finding. Other: None. ASPECTS [facility] Stroke Program Early CT Score) - Ganglionic level infarction (caudate, lentiform nuclei, internal capsule, insula, M1-M3 cortex): 7 - Supraganglionic infarction (M4-M6 cortex): 3 Total score (0-10 with 10 being normal): 10 IMPRESSION: 1. No acute intracranial abnormality identified. 2. Stable chronic microvascular ischemic changes of the brain, parenchymal volume loss, and chronic infarctions. 3. ASPECTS is 10 These results were called by telephone at the time of interpretation on 11/14/2017 at 9:32 pm to Dr. Alfred Levins, who verbally acknowledged these results. Electronically Signed   By: Kristine Garbe M.D.   On: 11/14/2017 21:36     Medications:   . lacosamide (VIMPAT) IV     . aspirin EC  325 mg Oral Daily  . atorvastatin  80 mg Oral QPM  . cephALEXin  500 mg Oral Q12H  . cinacalcet  60 mg Oral Q breakfast  . clopidogrel  75 mg Oral Daily  . fentaNYL      . gabapentin  100 mg Oral TID  . heparin  5,000 Units Subcutaneous Q8H  . insulin aspart  0-5 Units Subcutaneous QHS  . insulin aspart  0-9 Units Subcutaneous TID WC  . [START ON 11/17/2017] lacosamide  50 mg Oral BID  . metoprolol succinate  100 mg Oral QPM  . midazolam      . pantoprazole sodium  20 mg Oral BID  . sevelamer carbonate  2,400 mg Oral TID WC  . sodium chloride  flush      . torsemide  100 mg Oral Daily   acetaminophen **OR** acetaminophen (TYLENOL) oral liquid 160 mg/5 mL **OR** acetaminophen, ipratropium-albuterol  Assessment/ Plan:  [name] is a 67 y.o. 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 End-stage renal disease, on hemodialysis - plan for dialysis today.    2. Hypertension - Not currently at goal  - Continue metoprolol  - Currently off of her clonidine patch   3. Anemia of chronic kidney disease - Hold EPO at this time due to hemoglobin at goal, no indication at this time   4. Secondary hyperparathyroidism and hyperphosphatemia  -  Conitnue Renvela with meals  -  Continue Sensipar   5. Diabetes type 2 with complications of neuropathy and retinopathy and CKD - Continue Insulin regiment        LOS: 0 Shaniqwa Horsman 4/25/20192:47 PM

## 2017-11-16 NOTE — Progress Notes (Signed)
*  PRELIMINARY RESULTS* Echocardiogram Echocardiogram Transesophageal has been performed.  Cynthia Dean 11/16/2017, 10:27 AM

## 2017-11-16 NOTE — Consult Note (Addendum)
Cardiology Consultation:   Patient ID: Cynthia Dean; [id number]; 08-28-1950   Admit date: 11/14/2017 Date of Consult: 11/16/2017  Primary Care Provider: Lowella Bandy, MD Primary Cardiologist: New to see [facility] Primary Electrophysiologist:  Caryl Comes Physician requesting consult: Dr. Doy Mince, neurology Reason for consult: Consultation for transesophageal echo and placement of loop monitor, given recent stroke   Patient Profile:   Cynthia Dean is a 67 y.o. female with a history of stroke, dysarthria, right facial weakness, COPD, coronary artery disease, aortic atherosclerosis, end-stage renal disease on hemodialysis, hypertension, hyperlipidemia, diabetes, presenting with slurred speech November 12, 2017   History of Present Illness:   She presented to the hospital, reported that when she tried to drink it would dribble out the right side of her mouth, Went to church and hours later symptoms started to resolve Presented to the emergency room for evaluation, CT scan head showing acute punctate stroke of her thalamus Scanning showing previous stroke x2  Notes indicating she is a former smoker She was admitted to the floor, has been evaluated by neurology, Dr. Pandora Leiter to have cryptogenic stroke, likely embolic Transesophageal echo has been requested and consideration of implantable loop monitor   Past Medical History:  Diagnosis Date  . COPD (chronic obstructive pulmonary disease) ([place])   . Coronary artery disease   . Diabetes mellitus without complication ([place])   . ESRD on dialysis ([place])   . Heart murmur   . Hyperlipidemia   . Hypertension   . Stroke [facility])     Past Surgical History:  Procedure Laterality Date  . A/V FISTULAGRAM Left 12/20/2016   Procedure: A/V Fistulagram;  Surgeon: Katha Cabal, MD;  Location: [place] CV LAB;  Service: Cardiovascular;  Laterality: Left;  . A/V SHUNT INTERVENTION N/A 12/20/2016   Procedure: A/V Shunt  Intervention;  Surgeon: Katha Cabal, MD;  Location: [place] [facility];  Service: Cardiovascular;  Laterality: N/A;  . A/V SHUNTOGRAM Left 09/11/2017   Procedure: A/V SHUNTOGRAM;  Surgeon: Algernon Huxley, MD;  Location: [place] [facility];  Service: Cardiovascular;  Laterality: Left;  . BREAST BIOPSY Bilateral 07/19/00   neg  . LOOP RECORDER INSERTION N/A 11/16/2017   Procedure: LOOP RECORDER INSERTION;  Surgeon: Deboraha Sprang, MD;  Location: [place] [facility];  Service: Cardiovascular;  Laterality: N/A;  . PERIPHERAL VASCULAR CATHETERIZATION Left 02/02/2015   Procedure: A/V Shuntogram/Fistulagram;  Surgeon: Algernon Huxley, MD;  Location: [place] [facility];  Service: Cardiovascular;  Laterality: Left;  . PERIPHERAL VASCULAR CATHETERIZATION Left 02/02/2015   Procedure: A/V Shunt Intervention;  Surgeon: Algernon Huxley, MD;  Location: [place] [facility];  Service: Cardiovascular;  Laterality: Left;  . PERIPHERAL VASCULAR CATHETERIZATION Left 03/09/2015   Procedure: A/V Shuntogram/Fistulagram;  Surgeon: Algernon Huxley, MD;  Location: [place] [facility];  Service: Cardiovascular;  Laterality: Left;  . PERIPHERAL VASCULAR CATHETERIZATION N/A 03/09/2015   Procedure: A/V Shunt Intervention;  Surgeon: Algernon Huxley, MD;  Location: [place] [facility] LAB;  Service: Cardiovascular;  Laterality: N/A;  . PERIPHERAL VASCULAR CATHETERIZATION Left 04/17/2015   Procedure: Upper Extremity Angiography;  Surgeon: Algernon Huxley, MD;  Location: [place] [facility];  Service: Cardiovascular;  Laterality: Left;  . PERIPHERAL VASCULAR CATHETERIZATION  04/17/2015   Procedure: Upper Extremity Intervention;  Surgeon: Algernon Huxley, MD;  Location: [place] [facility];  Service: Cardiovascular;;  . PERIPHERAL VASCULAR CATHETERIZATION N/A 08/10/2015   Procedure: A/V Shuntogram/Fistulagram;  Surgeon: Algernon Huxley, MD;  Location: [place] [facility] LAB;  Service: Cardiovascular;  Laterality: N/A;  . PERIPHERAL VASCULAR  CATHETERIZATION N/A 08/10/2015   Procedure: A/V Shunt Intervention;  Surgeon: Algernon Huxley, MD;  Location: [place] [facility];  Service: Cardiovascular;  Laterality: N/A;  . PERIPHERAL VASCULAR CATHETERIZATION Left 04/11/2016   Procedure: A/V Shuntogram/Fistulagram;  Surgeon: Algernon Huxley, MD;  Location: [place] [facility];  Service: Cardiovascular;  Laterality: Left;     Home Medications:  Prior to Admission medications   Medication Sig Start Date End Date Taking? Authorizing Provider  albuterol-ipratropium (COMBIVENT) 18-103 MCG/ACT inhaler Inhale 2 puffs into the lungs 2 (two) times daily.   Yes [provider]  aspirin EC 325 MG tablet Take 1 tablet (325 mg total) by mouth daily. 11/13/17 12/13/17 Yes Pyreddy, Reatha Harps, MD  atorvastatin (LIPITOR) 80 MG tablet Take 80 mg by mouth every evening.    Yes [provider]  cloNIDine (CATAPRES - DOSED IN MG/24 HR) 0.2 mg/24hr patch Place 1 patch onto the skin once a week. 10/09/17  Yes [provider]  clopidogrel (PLAVIX) 75 MG tablet TAKE ONE TABLET BY MOUTH ONCE DAILY 06/14/16  Yes Stegmayer, Joelene Millin A, PA-C  gabapentin (NEURONTIN) 100 MG capsule Take 100 mg by mouth 3 (three) times daily.   Yes [provider]  insulin NPH-regular Human (NOVOLIN 70/30) (70-30) 100 UNIT/ML injection Inject 40 Units into the skin 2 (two) times daily with a meal. Except dialysis days. Do not take on Tuesdays, Thursdays, and Saturdays.   Yes [provider]  ipratropium-albuterol (DUONEB) 0.5-2.5 (3) MG/3ML SOLN Take 3 mLs by nebulization every 6 (six) hours as needed (shortness of breath/ wheezing).    Yes [provider]  losartan (COZAAR) 100 MG tablet Take 100 mg by mouth daily. 09/12/17  Yes [provider]  metoprolol succinate (TOPROL-XL) 100 MG 24 hr tablet Take 100 mg by mouth every evening. Take with or immediately following a meal.    Yes [provider]  omeprazole (PRILOSEC) 20 MG capsule  Take 20 mg by mouth 2 (two) times daily.    Yes [provider]  sevelamer carbonate (RENVELA) 800 MG tablet Take 2,400 mg by mouth 3 (three) times daily with meals.    Yes [provider]  torsemide (DEMADEX) 100 MG tablet Take 100 mg by mouth daily. 10/10/17  Yes [provider]  budesonide-formoterol (SYMBICORT) 160-4.5 MCG/ACT inhaler Inhale 2 puffs into the lungs 2 (two) times daily. Patient not taking: Reported on 11/12/2017 07/01/17   Loletha Grayer, MD  cinacalcet (SENSIPAR) 60 MG tablet Take 60 mg by mouth daily.    [provider]  lidocaine-prilocaine (EMLA) cream Apply 1 application topically as needed (port access).     [provider]  Nutritional Supplements (FEEDING SUPPLEMENT, NEPRO CARB STEADY,) LIQD Take 237 mLs by mouth 2 (two) times daily between meals. 07/01/17   Loletha Grayer, MD    Inpatient Medications: Scheduled Meds: . aspirin EC  325 mg Oral Daily  . atorvastatin  80 mg Oral QPM  . cephALEXin  500 mg Oral Q12H  . cinacalcet  60 mg Oral Q breakfast  . clopidogrel  75 mg Oral Daily  . fentaNYL      . gabapentin  100 mg Oral TID  . heparin  5,000 Units Subcutaneous Q8H  . insulin aspart  0-5 Units Subcutaneous QHS  . insulin aspart  0-9 Units Subcutaneous TID WC  . [START ON 11/17/2017] lacosamide  50 mg Oral BID  .  metoprolol succinate  100 mg Oral QPM  . midazolam      . pantoprazole sodium  20 mg Oral BID  . sevelamer carbonate  2,400 mg Oral TID WC  . sodium chloride flush      . torsemide  100 mg Oral Daily   Continuous Infusions: . lacosamide (VIMPAT) IV     PRN Meds: acetaminophen **OR** acetaminophen (TYLENOL) oral liquid 160 mg/5 mL **OR** acetaminophen, ipratropium-albuterol  Allergies:    Allergies  Allergen Reactions  . Tape Itching    Skin Dermatitis/itching (tape adhesive)    Social History:   Social History   Socioeconomic History  . Marital status: Single    Spouse name: Not on file    . Number of children: 5  . Years of education: Not on file  . Highest education level: Not on file  Occupational History  . Not on file  Social Needs  . Financial resource strain: Not hard at all  . Food insecurity:    Worry: Never true    Inability: Never true  . Transportation needs:    Medical: No    Non-medical: No  Tobacco Use  . Smoking status: Former Smoker    Packs/day: 0.00    Types: Cigarettes  . Smokeless tobacco: Never Used  Substance and Sexual Activity  . Alcohol use: No  . Drug use: No  . Sexual activity: Not Currently  Lifestyle  . Physical activity:    Days per week: 5 days    Minutes per session: 20 min  . Stress: Not at all  Relationships  . Social connections:    Talks on phone: Twice a week    Gets together: Twice a week    Attends religious service: More than 4 times per year    Active member of club or organization: Yes    Attends meetings of clubs or organizations: More than 4 times per year    Relationship status: Never married  . Intimate partner violence:    Fear of current or ex partner: No    Emotionally abused: No    Physically abused: No    Forced sexual activity: No  Other Topics Concern  . Not on file  Social History Narrative   Live with finance    Family History:    Family History  Problem Relation Age of Onset  . Breast cancer Father 60  . Stroke Mother   . Heart attack Mother   . Heart Problems Sister   . Breast cancer Cousin 65       1 st cousin. Maternal      ROS:  Please see the history of present illness.  Review of Systems  Constitutional: Negative.   Respiratory: Negative.   Cardiovascular: Negative.   Gastrointestinal: Negative.   Musculoskeletal: Negative.   Neurological: Negative.   Psychiatric/Behavioral: Negative.   All other systems reviewed and are negative.   Physical Exam/Data:   Vitals:   11/16/17 1403 11/16/17 1455 11/16/17 1458 11/16/17 1515  BP: (!) 183/72 (!) 175/64 (!) 160/68 (!)  166/81  Pulse: 60 (!) 59 60 (!) 59  Resp: 18 16 15 17   Temp:  97.9 F (36.6 C)    TempSrc:  Oral    SpO2: 98% 95% 98% 98%  Weight:      Height:        Intake/Output Summary (Last 24 hours) at 11/16/2017 1714 Last data filed at 11/16/2017 1403 Gross per 24 hour  Intake 240 ml  Output -  Net 240 ml   Filed Weights   11/14/17 2113 11/16/17 0805  Weight: 250 lb (113.4 kg) 250 lb (113.4 kg)   Body mass index is 39.16 kg/m.  General:  Well nourished, well developed, in no acute distress, obese HEENT: normal Lymph: no adenopathy Neck: no JVD Endocrine:  No thryomegaly Vascular: No carotid bruits; FA pulses 2+ bilaterally without bruits  Cardiac:  normal S1, S2; RRR; no murmur  Lungs:  clear to auscultation bilaterally, no wheezing, rhonchi or rales  Abd: soft, nontender, no hepatomegaly  Ext: no edema Musculoskeletal:  No deformities, BUE and BLE strength normal and equal Skin: warm and dry  Neuro:  CNs 2-12 intact, no focal abnormalities noted Psych:  Normal affect   EKG:  The EKG was personally reviewed and demonstrates: Normal sinus rhythm with rate in the 60s no significant ST or T wave changes Telemetry:  Telemetry was personally reviewed and demonstrates: Normal sinus rhythm  Relevant CV Studies:  Echocardiogram December 2018 with normal ejection fraction 55 to 65% mild MR  Laboratory Data:  Chemistry Recent Labs  Lab 11/12/17 1250 11/14/17 2345 11/16/17 1232  NA 138 136 136  K 3.6 3.8 4.6  CL 100* 98* 98*  CO2 30 28 27   GLUCOSE 71 164* 177*  BUN 42* 26* 47*  CREATININE 8.57* 6.66* 9.53*  CALCIUM 8.4* 8.1* 8.2*  GFRNONAA 4* 6* 4*  GFRAA 5* 7* 4*  ANIONGAP 8 10 11     Recent Labs  Lab 11/12/17 1250 11/14/17 2345 11/16/17 1232  PROT 7.2 7.2  --   ALBUMIN 3.8 3.5 3.6  AST 17 20  --   ALT 17 16  --   ALKPHOS 94 108  --   BILITOT 0.7 0.5  --    Hematology Recent Labs  Lab 11/12/17 1250 11/14/17 2156 11/16/17 1232  WBC 6.8 6.6 5.4  RBC 4.03  4.27 4.14  HGB 11.4* 12.0 11.8*  HCT 35.0 36.4 35.8  MCV 86.9 85.3 86.4  MCH 28.4 28.1 28.6  MCHC 32.7 33.0 33.1  RDW 15.5* 16.1* 15.5*  PLT 195 197 170   Cardiac Enzymes Recent Labs  Lab 11/12/17 1250 11/14/17 2345  TROPONINI <0.03 <0.03   No results for input(s): TROPIPOC in the last 168 hours.  BNPNo results for input(s): BNP, PROBNP in the last 168 hours.  DDimer No results for input(s): DDIMER in the last 168 hours.  Radiology/Studies:  Mr Angiogram Head Wo Contrast  Result Date: 11/12/2017 CLINICAL DATA:  Slurred speech and right facial droop. EXAM: MRA NECK WITHOUT CONTRAST MRA HEAD WITHOUT CONTRAST TECHNIQUE: Multiplanar and multiecho pulse sequences of the neck were obtained without intravenous contrast. Angiographic images of the neck were obtained using MRA technique without intravenous contrast.; Angiographic images of the Circle of Willis were obtained using MRA technique without intravenous contrast. COMPARISON:  Brain MRI 11/12/2017 FINDINGS: MRA NECK FINDINGS Time-of-flight MRA of the neck shows no hemodynamically significant stenosis of the vertebral or carotid arteries. Visualization of the proximal arteries is limited. MRA HEAD FINDINGS ANTERIOR CIRCULATION: --Intracranial internal carotid arteries: Normal. --Anterior cerebral arteries: Normal. Both A1 segments are present. Patent anterior communicating artery. --Middle cerebral arteries: Normal. --Posterior communicating arteries: Absent bilaterally. POSTERIOR CIRCULATION: --Basilar artery: Normal. --Posterior cerebral arteries: Normal. --Superior cerebellar arteries: Normal. --Inferior cerebellar arteries: Normal anterior and posterior inferior cerebellar arteries. IMPRESSION: Normal MRA of the head and neck. Electronically Signed   By: Ulyses Jarred M.D.   On: 11/12/2017 23:10   Mr Jodene Nam Neck Wo  Contrast  Result Date: 11/12/2017 CLINICAL DATA:  Slurred speech and right facial droop. EXAM: MRA NECK WITHOUT CONTRAST MRA  HEAD WITHOUT CONTRAST TECHNIQUE: Multiplanar and multiecho pulse sequences of the neck were obtained without intravenous contrast. Angiographic images of the neck were obtained using MRA technique without intravenous contrast.; Angiographic images of the Circle of Willis were obtained using MRA technique without intravenous contrast. COMPARISON:  Brain MRI 11/12/2017 FINDINGS: MRA NECK FINDINGS Time-of-flight MRA of the neck shows no hemodynamically significant stenosis of the vertebral or carotid arteries. Visualization of the proximal arteries is limited. MRA HEAD FINDINGS ANTERIOR CIRCULATION: --Intracranial internal carotid arteries: Normal. --Anterior cerebral arteries: Normal. Both A1 segments are present. Patent anterior communicating artery. --Middle cerebral arteries: Normal. --Posterior communicating arteries: Absent bilaterally. POSTERIOR CIRCULATION: --Basilar artery: Normal. --Posterior cerebral arteries: Normal. --Superior cerebellar arteries: Normal. --Inferior cerebellar arteries: Normal anterior and posterior inferior cerebellar arteries. IMPRESSION: Normal MRA of the head and neck. Electronically Signed   By: Ulyses Jarred M.D.   On: 11/12/2017 23:10   Mr Brain Wo Contrast  Result Date: 11/15/2017 CLINICAL DATA:  Slurred speech, recent thalamic stroke. History of end-stage renal disease on dialysis, hypertension, hyperlipidemia. EXAM: MRI HEAD WITHOUT CONTRAST TECHNIQUE: Multiplanar, multiecho pulse sequences of the brain and surrounding structures were obtained without intravenous contrast. COMPARISON:  CT HEAD November 14, 2017 and MRI of the head November 12, 2017 FINDINGS: INTRACRANIAL CONTENTS: No reduced diffusion to suggest acute ischemia. Either normalized RIGHT thalamus punctate infarct versus artifact. Minimal susceptibility artifact associated with RIGHT frontal lobe encephalomalacia. LEFT temporoparietal lobe encephalomalacia. Old bilateral cerebellar infarcts. Old LEFT basal ganglia  infarct with mild ex vacuo dilatation subjacent ventricle. Moderate parenchymal brain volume loss. No hydrocephalus. Patchy supratentorial and pontine white matter FLAIR T2 hyperintensities exclusive of aforementioned abnormality. No midline shift, mass effect or masses. No abnormal extra-axial fluid collections. VASCULAR: Normal major intracranial vascular flow voids present at skull base. SKULL AND UPPER CERVICAL SPINE: No abnormal sellar expansion. No suspicious calvarial bone marrow signal. Craniocervical junction maintained. SINUSES/ORBITS: The mastoid air-cells and included paranasal sinuses are well-aerated. Atretic LEFT maxillary sinus seen with chronic sinusitis.The included ocular globes and orbital contents are non-suspicious. OTHER: None. IMPRESSION: 1. No acute intracranial process. 2. 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 3. Moderate chronic small vessel ischemic disease. Old small LEFT basal ganglia and cerebellar infarcts. 4. Moderate parenchymal brain volume loss. Electronically Signed   By: Elon Alas M.D.   On: 11/15/2017 02:34   Mr Brain Wo Contrast  Result Date: 11/12/2017 CLINICAL DATA:  Slurred speech and facial droop. History of stroke, end-stage renal disease on dialysis, hypertension, hyperlipidemia, diabetes. EXAM: MRI HEAD WITHOUT CONTRAST TECHNIQUE: Multiplanar, multiecho pulse sequences of the brain and surrounding structures were obtained without intravenous contrast. COMPARISON:  CT HEAD November 12, 2017 and MRI of the head June 27, 2017. FINDINGS: Mild motion degraded examination. INTRACRANIAL CONTENTS: Punctate reduced diffusion RIGHT thalamus, too small to localized on ADC map. No susceptibility artifact to suggest hemorrhage. Minimal susceptibility artifact associated with RIGHT frontal lobe encephalomalacia. LEFT temporoparietal lobe encephalomalacia. Old bilateral cerebellar infarcts. Old LEFT basal ganglia lacunar infarct with mild ex vacuo dilatation subjacent  ventricle. Moderate parenchymal brain volume loss. No hydrocephalus. Patchy supratentorial and pontine white matter FLAIR T2 hyperintensities exclusive of the aforementioned abnormality. No midline shift, mass effect or masses. No abnormal extra-axial fluid collections. VASCULAR: Normal major intracranial vascular flow voids present at skull base. SKULL AND UPPER CERVICAL SPINE: No abnormal sellar expansion. No suspicious calvarial bone  marrow signal. Craniocervical junction maintained. SINUSES/ORBITS: The mastoid air-cells and included paranasal sinuses are well-aerated.The included ocular globes and orbital contents are non-suspicious. OTHER: None. IMPRESSION: 1. Motion degraded examination. Punctate acute/subacute RIGHT thalamus infarct versus artifact. 2. Old RIGHT frontal (MCA territory) and LEFT temporoparietal (MCA territory) infarcts. 3. Moderate chronic small vessel ischemic disease. Old LEFT basal ganglia and cerebellar small infarcts. 4. Moderate parenchymal brain volume loss. Electronically Signed   By: Elon Alas M.D.   On: 11/12/2017 18:26   US Carotid Bilateral  Result Date: 11/13/2017 CLINICAL DATA:  TIA EXAM: BILATERAL CAROTID DUPLEX ULTRASOUND TECHNIQUE: Pearline Cables scale imaging, color Doppler and duplex ultrasound were performed of bilateral carotid and vertebral arteries in the neck. COMPARISON:  None. FINDINGS: Criteria: Quantification of carotid stenosis is based on velocity parameters that correlate the residual internal carotid diameter with NASCET-based stenosis levels, using the diameter of the distal internal carotid lumen as the denominator for stenosis measurement. The following velocity measurements were obtained: RIGHT ICA:  96 cm/sec CCA:  85 cm/sec SYSTOLIC ICA/CCA RATIO:  1.1 DIASTOLIC ICA/CCA RATIO: ECA:  91 cm/sec LEFT ICA:  81 cm/sec CCA:  89 cm/sec SYSTOLIC ICA/CCA RATIO:  0.9 DIASTOLIC ICA/CCA RATIO: ECA:  100 cm/sec RIGHT CAROTID ARTERY: Mild scattered calcified plaque in  the upper common carotid and bulb. Low resistance internal carotid Doppler pattern. RIGHT VERTEBRAL ARTERY:  Antegrade. LEFT CAROTID ARTERY: Minimal calcified plaque in the bulb. Low resistance internal carotid Doppler pattern. LEFT VERTEBRAL ARTERY:  Antegrade. IMPRESSION: Less than 50% stenosis in the right and left internal carotid arteries. Electronically Signed   By: Marybelle Killings M.D.   On: 11/13/2017 15:26   Ct Head Code Stroke Wo Contrast`  Result Date: 11/14/2017 CLINICAL DATA:  Code stroke. 67 y/o F; sudden onset slurred speech after dialysis. EXAM: CT HEAD WITHOUT CONTRAST TECHNIQUE: Contiguous axial images were obtained from the base of the skull through the vertex without intravenous contrast. COMPARISON:  11/12/2017 MRI of the head and CT of the head. FINDINGS: Brain: No evidence of acute infarction, hemorrhage, hydrocephalus, extra-axial collection or mass lesion/mass effect. Stable cortical infarctions within the right anterolateral frontal lobe and left parietal lobe. Stable very small chronic infarctions within the right cerebellar hemisphere. Stable small chronic lacunar infarctions within bilateral basal ganglia. Stable chronic microvascular ischemic changes of white matter and parenchymal volume loss of the brain. Vascular: Calcific atherosclerosis of carotid siphons. No hyperdense vessel identified. Skull: Normal. Negative for fracture or focal lesion. Sinuses/Orbits: No acute finding. Other: None. ASPECTS [facility] Stroke Program Early CT Score) - Ganglionic level infarction (caudate, lentiform nuclei, internal capsule, insula, M1-M3 cortex): 7 - Supraganglionic infarction (M4-M6 cortex): 3 Total score (0-10 with 10 being normal): 10 IMPRESSION: 1. No acute intracranial abnormality identified. 2. Stable chronic microvascular ischemic changes of the brain, parenchymal volume loss, and chronic infarctions. 3. ASPECTS is 10 These results were called by telephone at the time of interpretation on  11/14/2017 at 9:32 pm to Dr. Alfred Levins, who verbally acknowledged these results. Electronically Signed   By: Kristine Garbe M.D.   On: 11/14/2017 21:36    Assessment and Plan:   1. Stroke Work-up including CT scan head, MRI, carotid ultrasound, echocardiogram December 2018 Given concern for embolic stroke, we have discussed transesophageal echo with her Concern for atrial fibrillation and thrombus in the left atrium or left atrial appendage Discussed risk and benefit of the procedure risks of esophageal damage, perforation (1:10,000 risk), bleeding, pharyngeal hematoma as well as other potential complications associated  with conscious sedation including aspiration, arrhythmia, respiratory failure and death. Alternatives to treatment were discussed, questions were answered. Patient is willing to proceed.  --- Case was discussed this morning with Dr. Jolyn Nap Explained recent history, concern for cryptogenic stroke and atrial fibrillation We will perform the transesophageal echo, he will then place the loop/reveal device  2) aortic atherosclerosis Carotid disease Currently on a statin and aspirin Would continue high intensity statin Goal LDL less than 70  3) hypertension Many of her medications appear to have been held Would restart slowly to avoid hypotension Appears to have been on clonidine, losartan, metoprolol  4) diabetes type 2 Hemoglobin A1c 7.1 Stressed importance of weight loss, avoiding high carbohydrate foods    Total encounter time more than  60 minutes  Greater than 50% was spent in counseling and coordination of care with the patient   For questions or updates, please contact [place] Please consult [email or web address] for contact info under Cardiology/STEMI.   Signed, Ida Rogue, MD  11/16/2017 5:14 PM

## 2017-11-16 NOTE — Consult Note (Signed)
ELECTROPHYSIOLOGY CONSULT NOTE  Patient ID: Cynthia Dean MRN: [id number], DOB/AGE: 1950/08/03   Admit date: 11/14/2017 Date of Consult: 11/16/2017  Primary Physician: Lowella Bandy, MD Primary Cardiologist: TG Reason for Consultation: Cryptogenic stroke; recommendations regarding Implantable Loop Recorder  History of Present Illness  EP has been asked to evaluate [name] for placement of an implantable loop recorder to monitor for atrial fibrillation by Dr Doy Mince.  The patient was admitted on 11/14/2017 with right facial droop and slurred speech.  She had a repeat episode following dialysis..  She has had a history of recurrent strokes one a year ago and 1 of thalamic stroke earlier this year.   Imaging demonstrated no acute processes, bilateral old MCA infarcts and small vessel disease.  she has undergone workup for stroke including echocardiogram and carotid dopplers.  The patient has been monitored on telemetry which has demonstrated sinus rhythm with no arrhythmias.  Inpatient stroke work-up is to be completed with a TEE.   Echocardiogram 12/18 demonstrated normal left ventricular function mild LVH.  TEE this morning demonstrated negative bubble study no left atrial appendage clots but pulmonary hypertension..  Lab work is reviewed.  Prior to admission, the patient denies chest pain, shortness of breath, dizziness, palpitations, or syncope.      Past Medical History:  Diagnosis Date  . COPD (chronic obstructive pulmonary disease) ([place])   . Coronary artery disease   . Diabetes mellitus without complication ([place])   . ESRD on dialysis ([place])   . Heart murmur   . Hyperlipidemia   . Hypertension   . Stroke [facility])      Surgical History:  Past Surgical History:  Procedure Laterality Date  . A/V FISTULAGRAM Left 12/20/2016   Procedure: A/V Fistulagram;  Surgeon: Katha Cabal, MD;  Location: [place] [facility];  Service: Cardiovascular;  Laterality:  Left;  . A/V SHUNT INTERVENTION N/A 12/20/2016   Procedure: A/V Shunt Intervention;  Surgeon: Katha Cabal, MD;  Location: [place] [facility];  Service: Cardiovascular;  Laterality: N/A;  . A/V SHUNTOGRAM Left 09/11/2017   Procedure: A/V SHUNTOGRAM;  Surgeon: Algernon Huxley, MD;  Location: [place] [facility];  Service: Cardiovascular;  Laterality: Left;  . BREAST BIOPSY Bilateral 07/19/00   neg  . PERIPHERAL VASCULAR CATHETERIZATION Left 02/02/2015   Procedure: A/V Shuntogram/Fistulagram;  Surgeon: Algernon Huxley, MD;  Location: [place] [facility];  Service: Cardiovascular;  Laterality: Left;  . PERIPHERAL VASCULAR CATHETERIZATION Left 02/02/2015   Procedure: A/V Shunt Intervention;  Surgeon: Algernon Huxley, MD;  Location: [place] [facility];  Service: Cardiovascular;  Laterality: Left;  . PERIPHERAL VASCULAR CATHETERIZATION Left 03/09/2015   Procedure: A/V Shuntogram/Fistulagram;  Surgeon: Algernon Huxley, MD;  Location: [place] [facility];  Service: Cardiovascular;  Laterality: Left;  . PERIPHERAL VASCULAR CATHETERIZATION N/A 03/09/2015   Procedure: A/V Shunt Intervention;  Surgeon: Algernon Huxley, MD;  Location: [place] [facility];  Service: Cardiovascular;  Laterality: N/A;  . PERIPHERAL VASCULAR CATHETERIZATION Left 04/17/2015   Procedure: Upper Extremity Angiography;  Surgeon: Algernon Huxley, MD;  Location: [place] [facility];  Service: Cardiovascular;  Laterality: Left;  . PERIPHERAL VASCULAR CATHETERIZATION  04/17/2015   Procedure: Upper Extremity Intervention;  Surgeon: Algernon Huxley, MD;  Location: [place] [facility];  Service: Cardiovascular;;  . PERIPHERAL VASCULAR CATHETERIZATION N/A 08/10/2015   Procedure: A/V Shuntogram/Fistulagram;  Surgeon: Algernon Huxley, MD;  Location: [place] [facility];  Service: Cardiovascular;  Laterality: N/A;  . PERIPHERAL VASCULAR CATHETERIZATION N/A 08/10/2015   Procedure: A/V Shunt Intervention;  Surgeon: Algernon Huxley, MD;  Location: [place] [facility];   Service: Cardiovascular;  Laterality: N/A;  . PERIPHERAL VASCULAR CATHETERIZATION Left 04/11/2016   Procedure: A/V Shuntogram/Fistulagram;  Surgeon: Algernon Huxley, MD;  Location: [place] [facility];  Service: Cardiovascular;  Laterality: Left;     Medications Prior to Admission  Medication Sig Dispense Refill Last Dose  . albuterol-ipratropium (COMBIVENT) 18-103 MCG/ACT inhaler Inhale 2 puffs into the lungs 2 (two) times daily.   11/14/2017 at Unknown time  . aspirin EC 325 MG tablet Take 1 tablet (325 mg total) by mouth daily. 30 tablet 3 11/14/2017 at Unknown time  . atorvastatin (LIPITOR) 80 MG tablet Take 80 mg by mouth every evening.    11/13/2017 at Unknown time  . cloNIDine (CATAPRES - DOSED IN MG/24 HR) 0.2 mg/24hr patch Place 1 patch onto the skin once a week.   Past Week at Unknown time  . clopidogrel (PLAVIX) 75 MG tablet TAKE ONE TABLET BY MOUTH ONCE DAILY 30 tablet 5 11/14/2017 at Unknown time  . gabapentin (NEURONTIN) 100 MG capsule Take 100 mg by mouth 3 (three) times daily.   11/14/2017 at Unknown time  . insulin NPH-regular Human (NOVOLIN 70/30) (70-30) 100 UNIT/ML injection Inject 40 Units into the skin 2 (two) times daily with a meal. Except dialysis days. Do not take on Tuesdays, Thursdays, and Saturdays.   11/14/2017 at Unknown time  . ipratropium-albuterol (DUONEB) 0.5-2.5 (3) MG/3ML SOLN Take 3 mLs by nebulization every 6 (six) hours as needed (shortness of breath/ wheezing).    prn  . losartan (COZAAR) 100 MG tablet Take 100 mg by mouth daily.   11/14/2017 at Unknown time  . metoprolol succinate (TOPROL-XL) 100 MG 24 hr tablet Take 100 mg by mouth every evening. Take with or immediately following a meal.    11/14/2017 at Unknown time  . omeprazole (PRILOSEC) 20 MG capsule Take 20 mg by mouth 2 (two) times daily.    11/14/2017 at Unknown time  . sevelamer carbonate (RENVELA) 800 MG tablet Take 2,400 mg by mouth 3 (three) times daily with meals.    11/14/2017 at Unknown time  .  torsemide (DEMADEX) 100 MG tablet Take 100 mg by mouth daily.   11/14/2017 at Unknown time  . budesonide-formoterol (SYMBICORT) 160-4.5 MCG/ACT inhaler Inhale 2 puffs into the lungs 2 (two) times daily. (Patient not taking: Reported on 11/12/2017) 1 Inhaler 12 Not Taking at Unknown time  . cinacalcet (SENSIPAR) 60 MG tablet Take 60 mg by mouth daily.   Not Taking at Unknown time  . lidocaine-prilocaine (EMLA) cream Apply 1 application topically as needed (port access).    PRN at PRN  . Nutritional Supplements (FEEDING SUPPLEMENT, NEPRO CARB STEADY,) LIQD Take 237 mLs by mouth 2 (two) times daily between meals. 60 Can 0 UNKNOWN at UNKNOWN    Inpatient Medications:  . aspirin EC  325 mg Oral Daily  . atorvastatin  80 mg Oral QPM  . cephALEXin  500 mg Oral Q12H  . cinacalcet  60 mg Oral Q breakfast  . clopidogrel  75 mg Oral Daily  . fentaNYL      . gabapentin  100 mg Oral TID  . heparin  5,000 Units Subcutaneous Q8H  . insulin aspart  0-5 Units Subcutaneous QHS  . insulin aspart  0-9 Units Subcutaneous TID WC  . [START ON 11/17/2017] lacosamide  50 mg Oral  BID  . metoprolol succinate  100 mg Oral QPM  . midazolam      . pantoprazole sodium  20 mg Oral BID  . sevelamer carbonate  2,400 mg Oral TID WC  . sodium chloride flush      . torsemide  100 mg Oral Daily    Allergies:  Allergies  Allergen Reactions  . Tape Itching    Skin Dermatitis/itching (tape adhesive)    Social History   Socioeconomic History  . Marital status: Single    Spouse name: Not on file  . Number of children: 5  . Years of education: Not on file  . Highest education level: Not on file  Occupational History  . Not on file  Social Needs  . Financial resource strain: Not hard at all  . Food insecurity:    Worry: Never true    Inability: Never true  . Transportation needs:    Medical: No    Non-medical: No  Tobacco Use  . Smoking status: Former Smoker    Packs/day: 0.00    Types: Cigarettes  .  Smokeless tobacco: Never Used  Substance and Sexual Activity  . Alcohol use: No  . Drug use: No  . Sexual activity: Not Currently  Lifestyle  . Physical activity:    Days per week: 5 days    Minutes per session: 20 min  . Stress: Not at all  Relationships  . Social connections:    Talks on phone: Twice a week    Gets together: Twice a week    Attends religious service: More than 4 times per year    Active member of club or organization: Yes    Attends meetings of clubs or organizations: More than 4 times per year    Relationship status: Never married  . Intimate partner violence:    Fear of current or ex partner: No    Emotionally abused: No    Physically abused: No    Forced sexual activity: No  Other Topics Concern  . Not on file  Social History Narrative   Live with finance     Family History  Problem Relation Age of Onset  . Breast cancer Father 17  . Stroke Mother   . Heart attack Mother   . Heart Problems Sister   . Breast cancer Cousin 33       1 st cousin. Maternal       Review of Systems: All other systems reviewed and are otherwise negative except as noted above.  Physical Exam: Vitals:   11/16/17 1100 11/16/17 1115 11/16/17 1130 11/16/17 1146  BP: (!) 176/79 (!) 183/78 (!) 190/84 (!) 181/71  Pulse: (!) 59 (!) 59 (!) 59 60  Resp: 13 14 12 18   Temp:    98.3 F (36.8 C)  TempSrc:    Oral  SpO2: 94% 95% 94% 95%  Weight:      Height:        GEN- The patient is well appearing, alert and oriented x 3 today.   Head- normocephalic, atraumatic Eyes-  Sclera clear, conjunctiva pink Ears- hearing intact Oropharynx- clear Neck- supple Lungs- Clear to ausculation bilaterally, normal work of breathing Heart- Regular rate and rhythm, no murmurs, rubs or gallops  GI- soft, NT, ND, + BS Extremities- no clubbing, cyanosis, or edema MS- no significant deformity or atrophy Skin- no rash or lesion Psych- euthymic mood, full affect   Labs:   Lab Results    Component Value Date  WBC 5.4 11/16/2017   HGB 11.8 (L) 11/16/2017   HCT 35.8 11/16/2017   MCV 86.4 11/16/2017   PLT 170 11/16/2017    Recent Labs  Lab 11/14/17 2345 11/16/17 1232  NA 136 136  K 3.8 4.6  CL 98* 98*  CO2 28 27  BUN 26* 47*  CREATININE 6.66* 9.53*  CALCIUM 8.1* 8.2*  PROT 7.2  --   BILITOT 0.5  --   ALKPHOS 108  --   ALT 16  --   AST 20  --   GLUCOSE 164* 177*     Radiology/Studies: Ct Head Wo Contrast  Result Date: 11/12/2017 CLINICAL DATA:  Onset of slurred speech this morning. EXAM: CT HEAD WITHOUT CONTRAST TECHNIQUE: Contiguous axial images were obtained from the base of the skull through the vertex without intravenous contrast. COMPARISON:  Brain MRI and head CT scan 06/27/2017. FINDINGS: Brain: No evidence of acute infarction, hemorrhage, hydrocephalus, extra-axial collection or mass lesion/mass effect. Right frontal and left parietal infarcts are unchanged. There is some atrophy and chronic microvascular ischemic disease. Vascular: Extensive atherosclerosis. Skull: Intact. Sinuses/Orbits: Negative. Other: None. IMPRESSION: No acute abnormality. Atrophy, chronic microvascular ischemic change and remote right frontal and left parietal infarcts, unchanged. Atherosclerosis. Electronically Signed   By: Inge Rise M.D.   On: 11/12/2017 13:22   Mr Angiogram Head Wo Contrast  Result Date: 11/12/2017 CLINICAL DATA:  Slurred speech and right facial droop. EXAM: MRA NECK WITHOUT CONTRAST MRA HEAD WITHOUT CONTRAST TECHNIQUE: Multiplanar and multiecho pulse sequences of the neck were obtained without intravenous contrast. Angiographic images of the neck were obtained using MRA technique without intravenous contrast.; Angiographic images of the Circle of Willis were obtained using MRA technique without intravenous contrast. COMPARISON:  Brain MRI 11/12/2017 FINDINGS: MRA NECK FINDINGS Time-of-flight MRA of the neck shows no hemodynamically significant stenosis of the  vertebral or carotid arteries. Visualization of the proximal arteries is limited. MRA HEAD FINDINGS ANTERIOR CIRCULATION: --Intracranial internal carotid arteries: Normal. --Anterior cerebral arteries: Normal. Both A1 segments are present. Patent anterior communicating artery. --Middle cerebral arteries: Normal. --Posterior communicating arteries: Absent bilaterally. POSTERIOR CIRCULATION: --Basilar artery: Normal. --Posterior cerebral arteries: Normal. --Superior cerebellar arteries: Normal. --Inferior cerebellar arteries: Normal anterior and posterior inferior cerebellar arteries. IMPRESSION: Normal MRA of the head and neck. Electronically Signed   By: Ulyses Jarred M.D.   On: 11/12/2017 23:10   Mr Jodene Nam Neck Wo Contrast  Result Date: 11/12/2017 CLINICAL DATA:  Slurred speech and right facial droop. EXAM: MRA NECK WITHOUT CONTRAST MRA HEAD WITHOUT CONTRAST TECHNIQUE: Multiplanar and multiecho pulse sequences of the neck were obtained without intravenous contrast. Angiographic images of the neck were obtained using MRA technique without intravenous contrast.; Angiographic images of the Circle of Willis were obtained using MRA technique without intravenous contrast. COMPARISON:  Brain MRI 11/12/2017 FINDINGS: MRA NECK FINDINGS Time-of-flight MRA of the neck shows no hemodynamically significant stenosis of the vertebral or carotid arteries. Visualization of the proximal arteries is limited. MRA HEAD FINDINGS ANTERIOR CIRCULATION: --Intracranial internal carotid arteries: Normal. --Anterior cerebral arteries: Normal. Both A1 segments are present. Patent anterior communicating artery. --Middle cerebral arteries: Normal. --Posterior communicating arteries: Absent bilaterally. POSTERIOR CIRCULATION: --Basilar artery: Normal. --Posterior cerebral arteries: Normal. --Superior cerebellar arteries: Normal. --Inferior cerebellar arteries: Normal anterior and posterior inferior cerebellar arteries. IMPRESSION: Normal MRA of  the head and neck. Electronically Signed   By: Ulyses Jarred M.D.   On: 11/12/2017 23:10   Mr Brain Wo Contrast  Result Date: 11/15/2017 CLINICAL DATA:  Slurred speech, recent thalamic stroke. History of end-stage renal disease on dialysis, hypertension, hyperlipidemia. EXAM: MRI HEAD WITHOUT CONTRAST TECHNIQUE: Multiplanar, multiecho pulse sequences of the brain and surrounding structures were obtained without intravenous contrast. COMPARISON:  CT HEAD November 14, 2017 and MRI of the head November 12, 2017 FINDINGS: INTRACRANIAL CONTENTS: No reduced diffusion to suggest acute ischemia. Either normalized RIGHT thalamus punctate infarct versus artifact. Minimal susceptibility artifact associated with RIGHT frontal lobe encephalomalacia. LEFT temporoparietal lobe encephalomalacia. Old bilateral cerebellar infarcts. Old LEFT basal ganglia infarct with mild ex vacuo dilatation subjacent ventricle. Moderate parenchymal brain volume loss. No hydrocephalus. Patchy supratentorial and pontine white matter FLAIR T2 hyperintensities exclusive of aforementioned abnormality. No midline shift, mass effect or masses. No abnormal extra-axial fluid collections. VASCULAR: Normal major intracranial vascular flow voids present at skull base. SKULL AND UPPER CERVICAL SPINE: No abnormal sellar expansion. No suspicious calvarial bone marrow signal. Craniocervical junction maintained. SINUSES/ORBITS: The mastoid air-cells and included paranasal sinuses are well-aerated. Atretic LEFT maxillary sinus seen with chronic sinusitis.The included ocular globes and orbital contents are non-suspicious. OTHER: None. IMPRESSION: 1. No acute intracranial process. 2. 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 3. Moderate chronic small vessel ischemic disease. Old small LEFT basal ganglia and cerebellar infarcts. 4. Moderate parenchymal brain volume loss. Electronically Signed   By: Elon Alas M.D.   On: 11/15/2017 02:34   Mr Brain Wo  Contrast  Result Date: 11/12/2017 CLINICAL DATA:  Slurred speech and facial droop. History of stroke, end-stage renal disease on dialysis, hypertension, hyperlipidemia, diabetes. EXAM: MRI HEAD WITHOUT CONTRAST TECHNIQUE: Multiplanar, multiecho pulse sequences of the brain and surrounding structures were obtained without intravenous contrast. COMPARISON:  CT HEAD November 12, 2017 and MRI of the head June 27, 2017. FINDINGS: Mild motion degraded examination. INTRACRANIAL CONTENTS: Punctate reduced diffusion RIGHT thalamus, too small to localized on ADC map. No susceptibility artifact to suggest hemorrhage. Minimal susceptibility artifact associated with RIGHT frontal lobe encephalomalacia. LEFT temporoparietal lobe encephalomalacia. Old bilateral cerebellar infarcts. Old LEFT basal ganglia lacunar infarct with mild ex vacuo dilatation subjacent ventricle. Moderate parenchymal brain volume loss. No hydrocephalus. Patchy supratentorial and pontine white matter FLAIR T2 hyperintensities exclusive of the aforementioned abnormality. No midline shift, mass effect or masses. No abnormal extra-axial fluid collections. VASCULAR: Normal major intracranial vascular flow voids present at skull base. SKULL AND UPPER CERVICAL SPINE: No abnormal sellar expansion. No suspicious calvarial bone marrow signal. Craniocervical junction maintained. SINUSES/ORBITS: The mastoid air-cells and included paranasal sinuses are well-aerated.The included ocular globes and orbital contents are non-suspicious. OTHER: None. IMPRESSION: 1. Motion degraded examination. Punctate acute/subacute RIGHT thalamus infarct versus artifact. 2. Old RIGHT frontal (MCA territory) and LEFT temporoparietal (MCA territory) infarcts. 3. Moderate chronic small vessel ischemic disease. Old LEFT basal ganglia and cerebellar small infarcts. 4. Moderate parenchymal brain volume loss. Electronically Signed   By: Elon Alas M.D.   On: 11/12/2017 18:26   US  Carotid Bilateral  Result Date: 11/13/2017 CLINICAL DATA:  TIA EXAM: BILATERAL CAROTID DUPLEX ULTRASOUND TECHNIQUE: Pearline Cables scale imaging, color Doppler and duplex ultrasound were performed of bilateral carotid and vertebral arteries in the neck. COMPARISON:  None. FINDINGS: Criteria: Quantification of carotid stenosis is based on velocity parameters that correlate the residual internal carotid diameter with NASCET-based stenosis levels, using the diameter of the distal internal carotid lumen as the denominator for stenosis measurement. The following velocity measurements were obtained: RIGHT ICA:  96 cm/sec CCA:  85 cm/sec SYSTOLIC ICA/CCA RATIO:  1.1 DIASTOLIC ICA/CCA RATIO: ECA:  91  cm/sec LEFT ICA:  81 cm/sec CCA:  89 cm/sec SYSTOLIC ICA/CCA RATIO:  0.9 DIASTOLIC ICA/CCA RATIO: ECA:  100 cm/sec RIGHT CAROTID ARTERY: Mild scattered calcified plaque in the upper common carotid and bulb. Low resistance internal carotid Doppler pattern. RIGHT VERTEBRAL ARTERY:  Antegrade. LEFT CAROTID ARTERY: Minimal calcified plaque in the bulb. Low resistance internal carotid Doppler pattern. LEFT VERTEBRAL ARTERY:  Antegrade. IMPRESSION: Less than 50% stenosis in the right and left internal carotid arteries. Electronically Signed   By: Marybelle Killings M.D.   On: 11/13/2017 15:26   Ct Head Code Stroke Wo Contrast`  Result Date: 11/14/2017 CLINICAL DATA:  Code stroke. 67 y/o F; sudden onset slurred speech after dialysis. EXAM: CT HEAD WITHOUT CONTRAST TECHNIQUE: Contiguous axial images were obtained from the base of the skull through the vertex without intravenous contrast. COMPARISON:  11/12/2017 MRI of the head and CT of the head. FINDINGS: Brain: No evidence of acute infarction, hemorrhage, hydrocephalus, extra-axial collection or mass lesion/mass effect. Stable cortical infarctions within the right anterolateral frontal lobe and left parietal lobe. Stable very small chronic infarctions within the right cerebellar hemisphere.  Stable small chronic lacunar infarctions within bilateral basal ganglia. Stable chronic microvascular ischemic changes of white matter and parenchymal volume loss of the brain. Vascular: Calcific atherosclerosis of carotid siphons. No hyperdense vessel identified. Skull: Normal. Negative for fracture or focal lesion. Sinuses/Orbits: No acute finding. Other: None. ASPECTS [facility] Stroke Program Early CT Score) - Ganglionic level infarction (caudate, lentiform nuclei, internal capsule, insula, M1-M3 cortex): 7 - Supraganglionic infarction (M4-M6 cortex): 3 Total score (0-10 with 10 being normal): 10 IMPRESSION: 1. No acute intracranial abnormality identified. 2. Stable chronic microvascular ischemic changes of the brain, parenchymal volume loss, and chronic infarctions. 3. ASPECTS is 10 These results were called by telephone at the time of interpretation on 11/14/2017 at 9:32 pm to Dr. Alfred Levins, who verbally acknowledged these results. Electronically Signed   By: Kristine Garbe M.D.   On: 11/14/2017 21:36    12-lead ECG sinus  (personally reviewed) Random prior EKG's in EPIC reviewed with no documented atrial fibrillation  Telemetry noraml (personally reviewed)  Assessment and Plan:  1. Cryptogenic stroke The patient presents with cryptogenic stroke.  The patient has a TEE planned for this AM.  I spoke at length with the patient about monitoring for afib with an implantable loop recorder.  Risks, benefits, and alteratives to implantable loop recorder were discussed with the patient today.   At this time, the patient is very clear in their decision to proceed with implantable loop recorder.   Wound care was reviewed with the patient (keep incision clean and dry for 3 days).  Wound check scheduled and entered in AVS.  Please call with questions.   Virl Axe, MD 11/16/2017 1:25 PM

## 2017-11-16 NOTE — Progress Notes (Signed)
[facility] NAME: Cynthia Dean    MR#:  [id number]  DATE OF BIRTH:  March 22, 1951  SUBJECTIVE:  CHIEF COMPLAINT:  Speech is better, TEE done today patient is down for hemodialysis.LINQ placed   REVIEW OF SYSTEMS:  CONSTITUTIONAL: No fever, fatigue or weakness.  EYES: No blurred or double vision.  EARS, NOSE, AND THROAT: No tinnitus or ear pain.  RESPIRATORY: No cough, shortness of breath, wheezing or hemoptysis.  CARDIOVASCULAR: No chest pain, orthopnea, edema.  GASTROINTESTINAL: No nausea, vomiting, diarrhea or abdominal pain.  GENITOURINARY: No dysuria, hematuria.  ENDOCRINE: No polyuria, nocturia,  HEMATOLOGY: No anemia, easy bruising or bleeding SKIN: No rash or lesion. MUSCULOSKELETAL: No joint pain or arthritis.   NEUROLOGIC: No tingling, numbness, weakness.  PSYCHIATRY: No anxiety or depression.   DRUG ALLERGIES:   Allergies  Allergen Reactions  . Tape Itching    Skin Dermatitis/itching (tape adhesive)    VITALS:  Blood pressure (!) 166/81, pulse (!) 59, temperature 98.3 F (36.8 C), temperature source Oral, resp. rate 17, height 5\' 7"  (1.702 m), weight 113.4 kg (250 lb), SpO2 98 %.  PHYSICAL EXAMINATION:  GENERAL:  67 y.o.-year-old patient lying in the bed with no acute distress.  EYES: Pupils equal, round, reactive to light and accommodation. No scleral icterus. Extraocular muscles intact.  HEENT: Head atraumatic, normocephalic. Oropharynx and nasopharynx clear.  NECK:  Supple, no jugular venous distention. No thyroid enlargement, no tenderness.  LUNGS: Normal breath sounds bilaterally, no wheezing, rales,rhonchi or crepitation. No use of accessory muscles of respiration.  CARDIOVASCULAR: S1, S2 normal. No murmurs, rubs, or gallops.  ABDOMEN: Soft, nontender, nondistended. Bowel sounds present. No organomegaly or mass.  EXTREMITIES: No pedal edema, cyanosis, or clubbing.  NEUROLOGIC: Cranial nerves II  through XII are intact. Muscle strength 5/5 in all extremities. Sensation intact. Gait not checked.  PSYCHIATRIC: The patient is alert and oriented x 3.  SKIN: No obvious rash, lesion, or ulcer.    LABORATORY PANEL:   CBC Recent Labs  Lab 11/16/17 1232  WBC 5.4  HGB 11.8*  HCT 35.8  PLT 170   ------------------------------------------------------------------------------------------------------------------  Chemistries  Recent Labs  Lab 11/14/17 2345 11/16/17 1232  NA 136 136  K 3.8 4.6  CL 98* 98*  CO2 28 27  GLUCOSE 164* 177*  BUN 26* 47*  CREATININE 6.66* 9.53*  CALCIUM 8.1* 8.2*  AST 20  --   ALT 16  --   ALKPHOS 108  --   BILITOT 0.5  --    ------------------------------------------------------------------------------------------------------------------  Cardiac Enzymes Recent Labs  Lab 11/14/17 2345  TROPONINI <0.03   ------------------------------------------------------------------------------------------------------------------  RADIOLOGY:  Mr Brain Wo Contrast  Result Date: 11/15/2017 CLINICAL DATA:  Slurred speech, recent thalamic stroke. History of end-stage renal disease on dialysis, hypertension, hyperlipidemia. EXAM: MRI HEAD WITHOUT CONTRAST TECHNIQUE: Multiplanar, multiecho pulse sequences of the brain and surrounding structures were obtained without intravenous contrast. COMPARISON:  CT HEAD November 14, 2017 and MRI of the head November 12, 2017 FINDINGS: INTRACRANIAL CONTENTS: No reduced diffusion to suggest acute ischemia. Either normalized RIGHT thalamus punctate infarct versus artifact. Minimal susceptibility artifact associated with RIGHT frontal lobe encephalomalacia. LEFT temporoparietal lobe encephalomalacia. Old bilateral cerebellar infarcts. Old LEFT basal ganglia infarct with mild ex vacuo dilatation subjacent ventricle. Moderate parenchymal brain volume loss. No hydrocephalus. Patchy supratentorial and pontine white matter FLAIR T2  hyperintensities exclusive of aforementioned abnormality. No midline shift, mass effect or masses. No abnormal extra-axial fluid collections. VASCULAR:  Normal major intracranial vascular flow voids present at skull base. SKULL AND UPPER CERVICAL SPINE: No abnormal sellar expansion. No suspicious calvarial bone marrow signal. Craniocervical junction maintained. SINUSES/ORBITS: The mastoid air-cells and included paranasal sinuses are well-aerated. Atretic LEFT maxillary sinus seen with chronic sinusitis.The included ocular globes and orbital contents are non-suspicious. OTHER: None. IMPRESSION: 1. No acute intracranial process. 2. 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 3. Moderate chronic small vessel ischemic disease. Old small LEFT basal ganglia and cerebellar infarcts. 4. Moderate parenchymal brain volume loss. Electronically Signed   By: Elon Alas M.D.   On: 11/15/2017 02:34   Ct Head Code Stroke Wo Contrast`  Result Date: 11/14/2017 CLINICAL DATA:  Code stroke. 68 y/o F; sudden onset slurred speech after dialysis. EXAM: CT HEAD WITHOUT CONTRAST TECHNIQUE: Contiguous axial images were obtained from the base of the skull through the vertex without intravenous contrast. COMPARISON:  11/12/2017 MRI of the head and CT of the head. FINDINGS: Brain: No evidence of acute infarction, hemorrhage, hydrocephalus, extra-axial collection or mass lesion/mass effect. Stable cortical infarctions within the right anterolateral frontal lobe and left parietal lobe. Stable very small chronic infarctions within the right cerebellar hemisphere. Stable small chronic lacunar infarctions within bilateral basal ganglia. Stable chronic microvascular ischemic changes of white matter and parenchymal volume loss of the brain. Vascular: Calcific atherosclerosis of carotid siphons. No hyperdense vessel identified. Skull: Normal. Negative for fracture or focal lesion. Sinuses/Orbits: No acute finding. Other: None. ASPECTS [facility]  Stroke Program Early CT Score) - Ganglionic level infarction (caudate, lentiform nuclei, internal capsule, insula, M1-M3 cortex): 7 - Supraganglionic infarction (M4-M6 cortex): 3 Total score (0-10 with 10 being normal): 10 IMPRESSION: 1. No acute intracranial abnormality identified. 2. Stable chronic microvascular ischemic changes of the brain, parenchymal volume loss, and chronic infarctions. 3. ASPECTS is 10 These results were called by telephone at the time of interpretation on 11/14/2017 at 9:32 pm to Dr. Alfred Levins, who verbally acknowledged these results. Electronically Signed   By: Kristine Garbe M.D.   On: 11/14/2017 21:36    EKG:   Orders placed or performed during the hospital encounter of 11/14/17  . ED EKG  . 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  Repeat MRI no acute abnormalities  continue aspirin and Plavix TEE done 11/16/17 -normal LV function no mural apical thrombus, no regional wall motion abnormalities normal LV function estimated EF 55%  LINQ placed EEG-abnormal EEG secondary to intermittent periods of left hemispheric slowing and independent left and right temporal sharp transients.  Findings consistent with the patient's history of bilateral MCA infarcts but may also suggest epileptogenic potential Patient is started on [place] neurology recommendation Neurology is following  Essential hypertension, benign -blood pressure 06/2017 echo with bubble study with normal ejection fraction 55 to 65% Carotid Dopplers on April 20th with less than 50% stenosis currently stable Patient denies any anxiety or depression LDL 28, hemoglobin A1c 7.1   Hypertension continue metoprolol     Diabetes (HCC) -sliding scale insulin with corresponding glucose checks    CAD (coronary artery disease) -continue home  medications    COPD (chronic obstructive pulmonary disease) (HCC) -no exacerbation continue inhalers as needed     ESRD on dialysis [facility]) -nephrology is following for dialysis    Hyperlipidemia -continue home med Lipitor       All the records  are reviewed and case discussed with Care Management/Social Workerr. Management plans discussed with the patient, family and they are in agreement.  CODE STATUS: FC   TOTAL TIME TAKING CARE OF THIS PATIENT: 33  minutes.   POSSIBLE D/C IN 1  DAYS, DEPENDING ON CLINICAL CONDITION.  Note: This dictation was prepared with Dragon dictation along with smaller phrase technology. Any transcriptional errors that result from this process are unintentional.   Nicholes Mango M.D on 11/16/2017 at 3:27 PM  Between 7am to 6pm - Pager - [phone] After 6pm go to [email or web address] - password [email or web address] [place] [facility]  Office  [phone]  CC: Primary care physician; Lowella Bandy, MD

## 2017-11-16 NOTE — Procedures (Addendum)
Transesophageal Echocardiogram :  Indication: Stroke, embolilc  Requesting/ordering  physician: Doy Mince  Procedure: Benzocaine spray x2 and 2 mls x 2 of viscous lidocaine were given orally to provide local anesthesia to the oropharynx. The patient was positioned supine on the left side, bite block provided. The patient was moderately sedated with the doses of versed and fentanyl as detailed below.  Using digital technique an omniplane probe was advanced into the distal esophagus without incident.   Moderate sedation: 1. Sedation used:  Versed: 3 mg, Fentanyl: 75 ug 2. Time administered:     Time when patient started recovery: 3. I was face to face during this time, 40 min  See report in EPIC  for complete details: In brief, transgastric imaging revealed normal LV function with no RWMAs and no mural apical thrombus.  .  Estimated ejection fraction was 55%.  Right sided cardiac chambers were normal  Moderate  pulmonary hypertension. RVSP 60 mm Hg or greater  Imaging of the septum showed no ASD or VSD Bubble study was negative for shunt 2D and color flow confirmed no PFO  The LA was well visualized in orthogonal views.  There was no spontaneous contrast and no thrombus in the LA and LA appendage   The descending thoracic aorta had mild to moderate aortic arch and descending aorta atherosclerosis,  with no evidence of aneurysmal dilation or disection   Cynthia Dean 11/16/2017 10:37 AM

## 2017-11-16 NOTE — Progress Notes (Signed)
Subjective: Patient awake and alert. No episodes of slurred speech noted.    Objective: Current vital signs: BP (!) 181/71 (BP Location: Right Arm)   Pulse 60   Temp 98.3 F (36.8 C) (Oral)   Resp 18   Ht 5\' 7"  (1.702 m)   Wt 113.4 kg (250 lb)   SpO2 95%   BMI 39.16 kg/m  Vital signs in last 24 hours: Temp:  [97.6 F (36.4 C)-98.3 F (36.8 C)] 98.3 F (36.8 C) (04/25 1146) Pulse Rate:  [58-78] 60 (04/25 1146) Resp:  [12-21] 18 (04/25 1146) BP: (132-191)/(45-106) 181/71 (04/25 1146) SpO2:  [88 %-99 %] 95 % (04/25 1146) Weight:  [113.4 kg (250 lb)] 113.4 kg (250 lb) (04/25 0805)  Intake/Output from previous day: 04/24 0701 - 04/25 0700 In: 720 [P.O.:720] Out: -  Intake/Output this shift: No intake/output data recorded. Nutritional status: Fall precautions Diet NPO time specified  Neurologic Exam: Mental Status: Alert, oriented, thought content appropriate. Speech fluent without evidence of aphasia. Able to follow 3 step commands without difficulty. Cranial Nerves: II: Discs flat bilaterally; Visual fields grossly normal, pupils equal, round, reactive to light and accommodation III,IV, VI: ptosis not present, extra-ocular motions intact bilaterally V,VII: smile symmetric, facial light touch sensation normal bilaterally VIII: hearing normal bilaterally IX,X: gag reflex present XI: bilateral shoulder shrug XII: midline tongue extension Motor: 5/5 throughout Sensory: Pinprick and light touch intact throughout, bilaterally   Lab Results: Basic Metabolic Panel: Recent Labs  Lab 11/12/17 1250 11/14/17 2345  NA 138 136  K 3.6 3.8  CL 100* 98*  CO2 30 28  GLUCOSE 71 164*  BUN 42* 26*  CREATININE 8.57* 6.66*  CALCIUM 8.4* 8.1*    Liver Function Tests: Recent Labs  Lab 11/12/17 1250 11/14/17 2345  AST 17 20  ALT 17 16  ALKPHOS 94 108  BILITOT 0.7 0.5  PROT 7.2 7.2  ALBUMIN 3.8 3.5   No results for input(s): LIPASE, AMYLASE in the last 168  hours. No results for input(s): AMMONIA in the last 168 hours.  CBC: Recent Labs  Lab 11/12/17 1250 11/14/17 2156  WBC 6.8 6.6  NEUTROABS 3.7 3.4  HGB 11.4* 12.0  HCT 35.0 36.4  MCV 86.9 85.3  PLT 195 197    Cardiac Enzymes: Recent Labs  Lab 11/12/17 1250 11/14/17 2345  TROPONINI <0.03 <0.03    Lipid Panel: Recent Labs  Lab 11/13/17 0407  CHOL 90  TRIG 169*  HDL 28*  CHOLHDL 3.2  VLDL 34  LDLCALC 28    CBG: Recent Labs  Lab 11/15/17 1345 11/15/17 1637 11/15/17 10-26-2015 11/16/17 0744 11/16/17 1146  GLUCAP 162* 181* 172* 149* 152*    Microbiology: Results for orders placed or performed during the hospital encounter of 06/27/17  MRSA PCR Screening     Status: None   Collection Time: 06/27/17  6:10 PM  Result Value Ref Range Status   MRSA by PCR NEGATIVE NEGATIVE Final    Comment:        The GeneXpert MRSA Assay (FDA approved for NASAL specimens only), is one component of a comprehensive MRSA colonization surveillance program. It is not intended to diagnose MRSA infection nor to guide or monitor treatment for MRSA infections.     Coagulation Studies: Recent Labs    11/14/17 10/26/54  LABPROT 11.5  INR 0.85    Imaging: Mr Brain Wo Contrast  Result Date: 11/15/2017 CLINICAL DATA:  Slurred speech, recent thalamic stroke. History of end-stage renal disease on dialysis, hypertension,  hyperlipidemia. EXAM: MRI HEAD WITHOUT CONTRAST TECHNIQUE: Multiplanar, multiecho pulse sequences of the brain and surrounding structures were obtained without intravenous contrast. COMPARISON:  CT HEAD November 14, 2017 and MRI of the head November 12, 2017 FINDINGS: INTRACRANIAL CONTENTS: No reduced diffusion to suggest acute ischemia. Either normalized RIGHT thalamus punctate infarct versus artifact. Minimal susceptibility artifact associated with RIGHT frontal lobe encephalomalacia. LEFT temporoparietal lobe encephalomalacia. Old bilateral cerebellar infarcts. Old LEFT basal  ganglia infarct with mild ex vacuo dilatation subjacent ventricle. Moderate parenchymal brain volume loss. No hydrocephalus. Patchy supratentorial and pontine white matter FLAIR T2 hyperintensities exclusive of aforementioned abnormality. No midline shift, mass effect or masses. No abnormal extra-axial fluid collections. VASCULAR: Normal major intracranial vascular flow voids present at skull base. SKULL AND UPPER CERVICAL SPINE: No abnormal sellar expansion. No suspicious calvarial bone marrow signal. Craniocervical junction maintained. SINUSES/ORBITS: The mastoid air-cells and included paranasal sinuses are well-aerated. Atretic LEFT maxillary sinus seen with chronic sinusitis.The included ocular globes and orbital contents are non-suspicious. OTHER: None. IMPRESSION: 1. No acute intracranial process. 2. 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 3. Moderate chronic small vessel ischemic disease. Old small LEFT basal ganglia and cerebellar infarcts. 4. Moderate parenchymal brain volume loss. Electronically Signed   By: Elon Alas M.D.   On: 11/15/2017 02:34   Ct Head Code Stroke Wo Contrast`  Result Date: 11/14/2017 CLINICAL DATA:  Code stroke. 67 y/o F; sudden onset slurred speech after dialysis. EXAM: CT HEAD WITHOUT CONTRAST TECHNIQUE: Contiguous axial images were obtained from the base of the skull through the vertex without intravenous contrast. COMPARISON:  11/12/2017 MRI of the head and CT of the head. FINDINGS: Brain: No evidence of acute infarction, hemorrhage, hydrocephalus, extra-axial collection or mass lesion/mass effect. Stable cortical infarctions within the right anterolateral frontal lobe and left parietal lobe. Stable very small chronic infarctions within the right cerebellar hemisphere. Stable small chronic lacunar infarctions within bilateral basal ganglia. Stable chronic microvascular ischemic changes of white matter and parenchymal volume loss of the brain. Vascular: Calcific  atherosclerosis of carotid siphons. No hyperdense vessel identified. Skull: Normal. Negative for fracture or focal lesion. Sinuses/Orbits: No acute finding. Other: None. ASPECTS [facility] Stroke Program Early CT Score) - Ganglionic level infarction (caudate, lentiform nuclei, internal capsule, insula, M1-M3 cortex): 7 - Supraganglionic infarction (M4-M6 cortex): 3 Total score (0-10 with 10 being normal): 10 IMPRESSION: 1. No acute intracranial abnormality identified. 2. Stable chronic microvascular ischemic changes of the brain, parenchymal volume loss, and chronic infarctions. 3. ASPECTS is 10 These results were called by telephone at the time of interpretation on 11/14/2017 at 9:32 pm to Dr. Alfred Levins, who verbally acknowledged these results. Electronically Signed   By: Kristine Garbe M.D.   On: 11/14/2017 21:36    Medications:  I have reviewed the patient's current medications. Scheduled: . aspirin EC  325 mg Oral Daily  . atorvastatin  80 mg Oral QPM  . cephALEXin  500 mg Oral Q12H  . cinacalcet  60 mg Oral Q breakfast  . clopidogrel  75 mg Oral Daily  . fentaNYL      . gabapentin  100 mg Oral TID  . heparin  5,000 Units Subcutaneous Q8H  . insulin aspart  0-5 Units Subcutaneous QHS  . insulin aspart  0-9 Units Subcutaneous TID WC  . metoprolol succinate  100 mg Oral QPM  . midazolam      . pantoprazole sodium  20 mg Oral BID  . sevelamer carbonate  2,400 mg Oral TID WC  .  sodium chloride flush      . torsemide  100 mg Oral Daily    Assessment/Plan: Patient with no further episodes of slurred speech.  TEE performed today and shows no significant abnormalities.  LINQ placed.  EEG reviewed and there is some concern for these current events being secondary to seizure activity.  Patient has had bilateral MCA distribution infarcts in the past.  After further conversation with patient patient actually having the episodes of slurred speech more than once a week.  May benefit from an AED  trial.    Recommendations: 1. Vimpat 100mg  IV this evening.  Patient then to start maintenance in AM of 50mg  BID. 2. Patient to follow up with neurology on an outpatient basis.  Currently scheduled to go to [facility] but not until end of May.  Is open to going to [facility] with follow up in 2 weeks after discharge.     LOS: 0 days   Alexis Goodell, MD Neurology [phone] 11/16/2017  12:01 PM

## 2017-11-16 NOTE — Progress Notes (Signed)
   11/16/17 1800  Vital Signs  Pulse Rate 63  Resp 18  BP (!) 194/77  BP Location Right Arm  BP Method Automatic  Patient Position (if appropriate) Lying  Oxygen Therapy  SpO2 97 %  O2 Device Room Air  Post-Hemodialysis Assessment  Rinseback Volume (mL) 250 mL  Dialyzer Clearance Lightly streaked  Duration of HD Treatment -hour(s) 3 hour(s)  Hemodialysis Intake (mL) 500 mL  UF Total -Machine (mL) 1500 mL  Net UF (mL) 1000 mL  Tolerated HD Treatment Yes  Post-Hemodialysis Comments Pt. tolerated Hd without complications  AVG/AVF Arterial Site Held (minutes) 10 minutes  AVG/AVF Venous Site Held (minutes) 10 minutes

## 2017-11-17 ENCOUNTER — Encounter: Payer: Self-pay | Admitting: Cardiovascular Disease

## 2017-11-17 ENCOUNTER — Telehealth: Payer: Self-pay

## 2017-11-17 LAB — MRSA PCR SCREENING: MRSA BY PCR: POSITIVE — AB

## 2017-11-17 LAB — URINE CULTURE: Special Requests: NORMAL

## 2017-11-17 LAB — GLUCOSE, CAPILLARY
Glucose-Capillary: 170 mg/dL — ABNORMAL HIGH (ref 65–99)
Glucose-Capillary: 138 mg/dL — ABNORMAL HIGH (ref 65–99)

## 2017-11-17 MED ORDER — LOSARTAN POTASSIUM 50 MG PO TABS
100.0000 mg | ORAL_TABLET | Freq: Every day | ORAL | Status: DC
  Administered 2017-11-17: 100 mg via ORAL
  Filled 2017-11-17: qty 2

## 2017-11-17 MED ORDER — POLYETHYLENE GLYCOL 3350 17 G PO PACK: 17.0000 g | PACK | Freq: Every day | ORAL | 0 refills | Status: DC | PRN

## 2017-11-17 MED ORDER — CEPHALEXIN 500 MG PO CAPS: 500.0000 mg | ORAL_CAPSULE | Freq: Two times a day (BID) | ORAL | 0 refills | Status: DC

## 2017-11-17 MED ORDER — MUPIROCIN 2 % EX OINT: TOPICAL_OINTMENT | CUTANEOUS | 0 refills | Status: DC

## 2017-11-17 MED ORDER — INSULIN NPH ISOPHANE & REGULAR (70-30) 100 UNIT/ML ~~LOC~~ SUSP: 25.0000 [IU] | Freq: Two times a day (BID) | SUBCUTANEOUS | 11 refills | Status: DC

## 2017-11-17 MED ORDER — LACOSAMIDE 50 MG PO TABS: 50.0000 mg | ORAL_TABLET | Freq: Two times a day (BID) | ORAL | 0 refills | Status: DC

## 2017-11-17 MED ORDER — DOCUSATE SODIUM 100 MG PO CAPS: 100.0000 mg | ORAL_CAPSULE | Freq: Two times a day (BID) | ORAL | 0 refills | Status: DC

## 2017-11-17 MED ORDER — ACETAMINOPHEN 325 MG PO TABS: 650.0000 mg | ORAL_TABLET | ORAL | Status: DC | PRN

## 2017-11-17 MED ORDER — CLONIDINE HCL 0.2 MG/24HR TD PTWK
0.2000 mg | MEDICATED_PATCH | TRANSDERMAL | Status: DC
  Filled 2017-11-17: qty 1

## 2017-11-17 MED ORDER — POLYETHYLENE GLYCOL 3350 17 G PO PACK: 17.0000 g | PACK | Freq: Every day | ORAL | Status: DC | PRN

## 2017-11-17 NOTE — Telephone Encounter (Signed)
TCM....  Patient is being discharged   They saw Dr. Caryl Comes and Dr. Rockey Situ  They are scheduled to see Dr. Rockey Situ on 5/13  They were seen for cryptogenic stroke  They need to be seen within 1 month   Pt not added to wait list   Please call

## 2017-11-17 NOTE — Telephone Encounter (Signed)
Patient currently Admitted. TCM call on Monday 4/29.

## 2017-11-17 NOTE — Care Management (Signed)
Discharge to home today per Dr. Margaretmary Eddy. Will request skilled nursing and physical therapy in the home. Followed by [facility]. Shelbie Ammons RN MSN CCM Case Management [phone]

## 2017-11-17 NOTE — Discharge Instructions (Signed)
Follow-up with primary care physician in 1 week Follow-up with nephrology in a week Continue hemodialysis tomorrow and after that continue on Monday Wednesday and Friday Follow-up with neurology in 2 weeks Follow-up with cardiology-[facility] in 1 month or per their recommendations

## 2017-11-17 NOTE — Progress Notes (Signed)
Pt has been discharged home. Discharge papers given and explained to pt. Pt verbalized understanding. Meds and f/u appointments reviewed. RX given.

## 2017-11-17 NOTE — Progress Notes (Signed)
[facility]  ROUNDING NOTE   Subjective:   Patient reports feeling better.  She is planning on going home today.  She has a dialysis treatment set up tomorrow outpatient.  Denies weakness and facial droop.  Husband at bedside.  Objective:  Vital signs in last 24 hours:  Temp:  [97.9 F (36.6 C)-98.6 F (37 C)] 98.3 F (36.8 C) (04/26 1204) Pulse Rate:  [59-66] 66 (04/26 1204) Resp:  [15-19] 18 (04/26 1204) BP: (91-200)/(59-81) 157/66 (04/26 1204) SpO2:  [92 %-99 %] 96 % (04/26 1204)  Weight change:  Filed Weights   11/14/17 2113 11/16/17 0805  Weight: 113.4 kg (250 lb) 113.4 kg (250 lb)    Intake/Output: I/O last 3 completed shifts: In: 240 [P.O.:240] Out: 1000 [Other:1000]   Intake/Output this shift:  Total I/O In: 240 [P.O.:240] Out: -   Physical Exam: General: NAD, up and walking around   Head: Normocephalic, atraumatic. Moist oral mucosal membranes  Eyes: Anicteric, PERRL  Neck: Supple, trachea midline  Lungs:  Clear to auscultation  Heart: Regular rate and rhythm  Abdomen:  Soft, nontender,   Extremities:  No peripheral edema.  Neurologic: Nonfocal, moving all four extremities. No weakness or facial droop   Skin: No lesions  Access: Left AVG    Basic Metabolic Panel: Recent Labs  Lab 11/12/17 1250 11/14/17 2345 11/16/17 1232  NA 138 136 136  K 3.6 3.8 4.6  CL 100* 98* 98*  CO2 30 28 27   GLUCOSE 71 164* 177*  BUN 42* 26* 47*  CREATININE 8.57* 6.66* 9.53*  CALCIUM 8.4* 8.1* 8.2*  PHOS  --   --  4.0    Liver Function Tests: Recent Labs  Lab 11/12/17 1250 11/14/17 2345 11/16/17 1232  AST 17 20  --   ALT 17 16  --   ALKPHOS 94 108  --   BILITOT 0.7 0.5  --   PROT 7.2 7.2  --   ALBUMIN 3.8 3.5 3.6   No results for input(s): LIPASE, AMYLASE in the last 168 hours. No results for input(s): AMMONIA in the last 168 hours.  CBC: Recent Labs  Lab 11/12/17 1250 11/14/17 2156 11/16/17 1232  WBC 6.8 6.6 5.4  NEUTROABS 3.7  3.4  --   HGB 11.4* 12.0 11.8*  HCT 35.0 36.4 35.8  MCV 86.9 85.3 86.4  PLT 195 197 170    Cardiac Enzymes: Recent Labs  Lab 11/12/17 1250 11/14/17 2345  TROPONINI <0.03 <0.03    BNP: Invalid input(s): POCBNP  CBG: Recent Labs  Lab 11/16/17 1146 11/16/17 1854 11/16/17 1953 11/17/17 0806 11/17/17 1205  GLUCAP 152* 144* 146* 170* 138*    Microbiology: Results for orders placed or performed during the hospital encounter of 11/14/17  Urine Culture     Status: Abnormal   Collection Time: 11/15/17  8:01 AM  Result Value Ref Range Status   Specimen Description   Final    URINE, CLEAN CATCH Performed at [facility], [address] [place], [place] [address]    Special Requests   Final    Normal Performed at [facility], [address] [place], [place] [address]    Culture MULTIPLE SPECIES PRESENT, SUGGEST RECOLLECTION (A)  Final   Report Status 11/17/2017 FINAL  Final  MRSA PCR Screening     Status: Abnormal   Collection Time: 11/16/17 12:49 PM  Result Value Ref Range Status   MRSA by PCR (A) NEGATIVE Final    INVALID, UNABLE TO DETERMINE  THE PRESENCE OF TARGET DNA DUE TO SPECIMEN INTEGRITY. RECOLLECTION REQUESTED.    Comment: Cynthia Dean AT 1540 ON 11/16/2017 JJB Performed at [facility], [address] [place], [place] [address]   MRSA PCR Screening     Status: Abnormal   Collection Time: 11/16/17  8:00 PM  Result Value Ref Range Status   MRSA by PCR POSITIVE (A) NEGATIVE Final    Comment:        The GeneXpert MRSA Assay (FDA approved for NASAL specimens only), is one component of a comprehensive MRSA colonization surveillance program. It is not intended to diagnose MRSA infection nor to guide or monitor treatment for MRSA infections. RESULT CALLED TO, READ BACK BY AND VERIFIED WITH: Cynthia Dean AT 7062 ON 11/17/17 [place]. Performed at [facility], [address] [place], [place] [address]      Coagulation Studies: Recent Labs    11/14/17 11-07-54  LABPROT 11.5  INR 0.85    Urinalysis: Recent Labs    11/14/17 November 07, 2154 11/15/17 0801  COLORURINE YELLOW* YELLOW*  LABSPEC 1.013 1.011  PHURINE 8.0 7.0  GLUCOSEU 50* NEGATIVE  HGBUR MODERATE* MODERATE*  BILIRUBINUR NEGATIVE NEGATIVE  KETONESUR NEGATIVE NEGATIVE  PROTEINUR 100* 100*  NITRITE NEGATIVE NEGATIVE  LEUKOCYTESUR NEGATIVE MODERATE*      Imaging: No results found.   Medications:    . aspirin EC  325 mg Oral Daily  . atorvastatin  80 mg Oral QPM  . cephALEXin  500 mg Oral Q12H  . cinacalcet  60 mg Oral Q breakfast  . cloNIDine  0.2 mg Transdermal Weekly  . clopidogrel  75 mg Oral Daily  . gabapentin  100 mg Oral TID  . heparin  5,000 Units Subcutaneous Q8H  . insulin aspart  0-5 Units Subcutaneous QHS  . insulin aspart  0-9 Units Subcutaneous TID WC  . lacosamide  50 mg Oral BID  . losartan  100 mg Oral Daily  . metoprolol succinate  100 mg Oral QPM  . pantoprazole sodium  20 mg Oral BID  . sevelamer carbonate  2,400 mg Oral TID WC  . torsemide  100 mg Oral Daily   acetaminophen **OR** acetaminophen (TYLENOL) oral liquid 160 mg/5 mL **OR** acetaminophen, ipratropium-albuterol, polyethylene glycol  Assessment/ Plan:  [name] is a 67 y.o. black  female with end stage renal disease on hemodialysis,COPD, coronary disease, diabetes type 2, with diabetic neuropathy, diabetic retinopathy, hyperlipidemia, hypertension, history of stroke, diastolic heart failure, esophagitis, morbid obesity, myelopathy of the cervical spinal cord with cervical radiculopathy, obstructive sleep apnea who was admitted for TIA on 4/23.   [facility]/MWF/[facility]/ 240 MIN/ 120 KG / left AVG  1. End-stage renal disease, on hemodialysis - No dialysis today  -plan for outpatient dialysis tomorrow, continue normal MWF schedule on Monday 4/29.   2.Hypertension - Not currently at goal  - Continue  metoprolol  - Currently off of her clonidine patch   3. Anemia of chronic kidney disease - Hold EPO at this time due to hemoglobin at goal, no indication at this time  - Will continue to monitor outpatient   4. Secondary hyperparathyroidism and hyperphosphatemia  -  Conitnue Renvela with meals  -  Continue Sensipar       LOS: 0 Cynthia Dean 4/26/201912:40 PM

## 2017-11-17 NOTE — Discharge Summary (Signed)
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 COPD (chronic obstructive pulmonary disease) ([place])   . Coronary artery disease   . Diabetes mellitus without complication ([place])   . ESRD on dialysis ([place])   . Heart murmur   . Hyperlipidemia   . Hypertension   . Stroke [facility])     HOSPITAL COURSE:   HPI  Cynthia Dean  is a 67 y.o. female who presents with an episode of slurred speech.  Patient was recently admitted here and had work-up for new small thalamic stroke.  She had a prior episode of stroke last year as well.  Today she was at dialysis and had a recurrent episode of slurred speech.  Here in the ED telemetry neurology saw the patient and recommended repeat MRI with follow-up with inpatient neurology.  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  Repeat MRI no acute abnormalities  continue aspirin and Plavix TEE done 11/16/17 -normal LV function no mural apical thrombus, no regional wall motion abnormalities normal LV function estimated EF 55%  LINQ placed, outpatient follow-up with [facility] cardiology as recommended by them EEG-abnormal EEG secondary to intermittent periods of left hemispheric slowing and independent left and right temporal sharp transients.  Findings consistent with  the patient's history of bilateral MCA infarcts but may also suggest epileptogenic potential Patient is started on Vimpat tonight, plan is to continue Vimpat p.o. and outpatient follow-up with neurology Appreciate neurology recommendation Neurology is followingEssential hypertension, benign -blood pressure 06/2017 echo with bubble study with normal ejection fraction 55 to 65% Carotid Dopplers on April 20th with less than 50% stenosis currently stable Patient denies any anxiety or depression LDL 28, hemoglobin A1c 7.1   Hypertension continue metoprolol, resume the clonidine and Cozaar   Diabetes ([place]) -sliding scale insulin with corresponding glucose checks  CAD (coronary artery disease) -continue home medications  COPD (chronic obstructive pulmonary disease) ([place]) -no exacerbation continue inhalers as needed   ESRD on dialysis [facility]) -nephrology is following for dialysis Patient will get outpatient hemodialysis tomorrow Saturday and resume at her routine schedule Monday Wednesday and Friday   Constipation: Colace daily and MiraLAX as needed   Hyperlipidemia -continue home med Lipitor    DISCHARGE CONDITIONS:   Stable    CONSULTS OBTAINED:  Treatment Team:  Alexis Goodell, MD Lavonia Dana, MD   PROCEDURES Linq monitor placed TEE done which is normal  DRUG ALLERGIES:   Allergies  Allergen Reactions  . Tape Itching    Skin Dermatitis/itching (tape adhesive)    DISCHARGE MEDICATIONS:   Allergies as of 11/17/2017      Reactions   Tape Itching   Skin Dermatitis/itching (tape adhesive)      Medication List    TAKE these medications   acetaminophen 325 MG tablet Commonly known as:  TYLENOL Take 2 tablets (650 mg total) by  mouth every 4 (four) hours as needed for mild pain (or temp > 37.5 C (99.5 F)).   aspirin EC 325 MG tablet Take 1 tablet (325 mg total) by mouth daily.   atorvastatin 80 MG tablet Commonly known as:  LIPITOR Take 80 mg  by mouth every evening.   budesonide-formoterol 160-4.5 MCG/ACT inhaler Commonly known as:  SYMBICORT Inhale 2 puffs into the lungs 2 (two) times daily.   cephALEXin 500 MG capsule Commonly known as:  KEFLEX Take 1 capsule (500 mg total) by mouth every 12 (twelve) hours.   cinacalcet 60 MG tablet Commonly known as:  SENSIPAR Take 60 mg by mouth daily.   cloNIDine 0.2 mg/24hr patch Commonly known as:  CATAPRES - Dosed in mg/24 hr Place 1 patch onto the skin once a week.   clopidogrel 75 MG tablet Commonly known as:  PLAVIX TAKE ONE TABLET BY MOUTH ONCE DAILY   feeding supplement (NEPRO CARB STEADY) Liqd Take 237 mLs by mouth 2 (two) times daily between meals.   gabapentin 100 MG capsule Commonly known as:  NEURONTIN Take 100 mg by mouth 3 (three) times daily.   insulin NPH-regular Human (70-30) 100 UNIT/ML injection Commonly known as:  NOVOLIN 70/30 Inject 25 Units into the skin 2 (two) times daily with a meal. Except dialysis days. Do not take on Tuesdays, Thursdays, and Saturdays. What changed:  how much to take   ipratropium-albuterol 0.5-2.5 (3) MG/3ML Soln Commonly known as:  DUONEB Take 3 mLs by nebulization every 6 (six) hours as needed (shortness of breath/ wheezing).   albuterol-ipratropium 18-103 MCG/ACT inhaler Commonly known as:  COMBIVENT Inhale 2 puffs into the lungs 2 (two) times daily.   lacosamide 50 MG Tabs tablet Commonly known as:  VIMPAT Take 1 tablet (50 mg total) by mouth 2 (two) times daily.   lidocaine-prilocaine cream Commonly known as:  EMLA Apply 1 application topically as needed (port access).   losartan 100 MG tablet Commonly known as:  COZAAR Take 100 mg by mouth daily.   metoprolol succinate 100 MG 24 hr tablet Commonly known as:  TOPROL-XL Take 100 mg by mouth every evening. Take with or immediately following a meal.   omeprazole 20 MG capsule Commonly known as:  PRILOSEC Take 20 mg by mouth 2 (two) times daily.   sevelamer  carbonate 800 MG tablet Commonly known as:  RENVELA Take 2,400 mg by mouth 3 (three) times daily with meals.   torsemide 100 MG tablet Commonly known as:  DEMADEX Take 100 mg by mouth daily.        DISCHARGE INSTRUCTIONS:  Follow-up with primary care physician in 1 week Follow-up with nephrology in a week Continue hemodialysis tomorrow and after that continue on Monday Wednesday and Friday Follow-up with neurology in 2 weeks Follow-up with cardiology-[facility] in 1 month or per their recommendations   DIET:  Renal diet  DISCHARGE CONDITION:  Stable  ACTIVITY:  Activity as tolerated,  OXYGEN:  Home Oxygen: No.   Oxygen Delivery: room air  DISCHARGE LOCATION:  home   If you experience worsening of your admission symptoms, develop shortness of breath, life threatening emergency, suicidal or homicidal thoughts you must seek medical attention immediately by calling 911 or calling your MD immediately  if symptoms less severe.  You Must read complete instructions/literature along with all the possible adverse reactions/side effects for all the Medicines you take and that have been prescribed to you. Take any new Medicines after you have completely understood and accpet  all the possible adverse reactions/side effects.   Please note  You were cared for by a hospitalist during your hospital stay. If you have any questions about your discharge medications or the care you received while you were in the hospital after you are discharged, you can call the unit and asked to speak with the hospitalist on call if the hospitalist that took care of you is not available. Once you are discharged, your primary care physician will handle any further medical issues. Please note that NO REFILLS for any discharge medications will be authorized once you are discharged, as it is imperative that you return to your primary care physician (or establish a relationship with a primary care physician if you do  not have one) for your aftercare needs so that they can reassess your need for medications and monitor your lab values.     Today  No chief complaint on file.  Patient's dysarthria completely resolved no other episodes.  Okay to discharge patient from neurology standpoint.  Reports constipation but wants to try stool softeners after she goes home.  Patient is aware that she has to get hemodialysis tomorrow and then resume her schedule Monday Wednesday Friday anterior chest wall  ROS:  CONSTITUTIONAL: Denies fevers, chills. Denies any fatigue, weakness.  EYES: Denies blurry vision, double vision, eye pain. EARS, NOSE, THROAT: Denies tinnitus, ear pain, hearing loss. RESPIRATORY: Denies cough, wheeze, shortness of breath.  CARDIOVASCULAR: Denies chest pain, palpitations, edema.  GASTROINTESTINAL: Denies nausea, vomiting, diarrhea, abdominal pain. Denies bright red blood per rectum. GENITOURINARY: Denies dysuria, hematuria. ENDOCRINE: Denies nocturia or thyroid problems. HEMATOLOGIC AND LYMPHATIC: Denies easy bruising or bleeding. SKIN: Denies rash or lesion. MUSCULOSKELETAL: Denies pain in neck, back, shoulder, knees, hips or arthritic symptoms.  NEUROLOGIC: Denies paralysis, paresthesias.  PSYCHIATRIC: Denies anxiety or depressive symptoms.   VITAL SIGNS:  Blood pressure (!) 169/66, pulse 66, temperature 98.5 F (36.9 C), temperature source Oral, resp. rate 19, height 5\' 7"  (1.702 m), weight 113.4 kg (250 lb), SpO2 98 %.  I/O:    Intake/Output Summary (Last 24 hours) at 11/17/2017 1157 Last data filed at 11/17/2017 1008 Gross per 24 hour  Intake 480 ml  Output 1000 ml  Net -520 ml    PHYSICAL EXAMINATION:  GENERAL:  67 y.o.-year-old patient lying in the bed with no acute distress.  EYES: Pupils equal, round, reactive to light and accommodation. No scleral icterus. Extraocular muscles intact.  HEENT: Head atraumatic, normocephalic. Oropharynx and nasopharynx clear.  NECK:   Supple, no jugular venous distention. No thyroid enlargement, no tenderness.  LUNGS: Normal breath sounds bilaterally, no wheezing, rales,rhonchi or crepitation. No use of accessory muscles of respiration.  CARDIOVASCULAR: Anterior chest wall with Linq monitor S1, S2 normal. No murmurs, rubs, or gallops.  ABDOMEN: Soft, non-tender, non-distended. Bowel sounds present. No organomegaly or mass.  EXTREMITIES: No pedal edema, cyanosis, or clubbing.  NEUROLOGIC: Cranial nerves II through XII are intact. Muscle strength 5/5 in all extremities. Sensation intact. Gait not checked.  PSYCHIATRIC: The patient is alert and oriented x 3.  SKIN: No obvious rash, lesion, or ulcer.   DATA REVIEW:   CBC Recent Labs  Lab 11/16/17 1232  WBC 5.4  HGB 11.8*  HCT 35.8  PLT 170    Chemistries  Recent Labs  Lab 11/14/17 2345 11/16/17 1232  NA 136 136  K 3.8 4.6  CL 98* 98*  CO2 28 27  GLUCOSE 164* 177*  BUN 26* 47*  CREATININE 6.66* 9.53*  CALCIUM 8.1* 8.2*  AST 20  --   ALT 16  --   ALKPHOS 108  --   BILITOT 0.5  --     Cardiac Enzymes Recent Labs  Lab 11/14/17 2345  TROPONINI <0.03    Microbiology Results  Results for orders placed or performed during the hospital encounter of 11/14/17  Urine Culture     Status: Abnormal   Collection Time: 11/15/17  8:01 AM  Result Value Ref Range Status   Specimen Description   Final    URINE, CLEAN CATCH Performed at [facility], [address] [place], [place] [address]    Special Requests   Final    Normal Performed at [facility], [address] [place], [place] [address]    Culture MULTIPLE SPECIES PRESENT, SUGGEST RECOLLECTION (A)  Final   Report Status 11/17/2017 FINAL  Final  MRSA PCR Screening     Status: Abnormal   Collection Time: 11/16/17 12:49 PM  Result Value Ref Range Status   MRSA by PCR (A) NEGATIVE Final    INVALID, UNABLE TO DETERMINE THE PRESENCE OF TARGET DNA DUE TO SPECIMEN INTEGRITY.  RECOLLECTION REQUESTED.    Comment: Torrie Mayers AT 1540 ON 11/16/2017 JJB Performed at [facility], [address] [place], [place] [address]   MRSA PCR Screening     Status: Abnormal   Collection Time: 11/16/17  8:00 PM  Result Value Ref Range Status   MRSA by PCR POSITIVE (A) NEGATIVE Final    Comment:        The GeneXpert MRSA Assay (FDA approved for NASAL specimens only), is one component of a comprehensive MRSA colonization surveillance program. It is not intended to diagnose MRSA infection nor to guide or monitor treatment for MRSA infections. RESULT CALLED TO, READ BACK BY AND VERIFIED WITH: MALKA SINWANY AT 5885 ON 11/17/17 [place]. Performed at [facility], [address] [place], [place] [address]     RADIOLOGY:  Mr Herby Abraham Contrast  Result Date: 11/15/2017 CLINICAL DATA:  Slurred speech, recent thalamic stroke. History of end-stage renal disease on dialysis, hypertension, hyperlipidemia. EXAM: MRI HEAD WITHOUT CONTRAST TECHNIQUE: Multiplanar, multiecho pulse sequences of the brain and surrounding structures were obtained without intravenous contrast. COMPARISON:  CT HEAD November 14, 2017 and MRI of the head November 12, 2017 FINDINGS: INTRACRANIAL CONTENTS: No reduced diffusion to suggest acute ischemia. Either normalized RIGHT thalamus punctate infarct versus artifact. Minimal susceptibility artifact associated with RIGHT frontal lobe encephalomalacia. LEFT temporoparietal lobe encephalomalacia. Old bilateral cerebellar infarcts. Old LEFT basal ganglia infarct with mild ex vacuo dilatation subjacent ventricle. Moderate parenchymal brain volume loss. No hydrocephalus. Patchy supratentorial and pontine white matter FLAIR T2 hyperintensities exclusive of aforementioned abnormality. No midline shift, mass effect or masses. No abnormal extra-axial fluid collections. VASCULAR: Normal major intracranial vascular flow voids present at skull base. SKULL AND UPPER  CERVICAL SPINE: No abnormal sellar expansion. No suspicious calvarial bone marrow signal. Craniocervical junction maintained. SINUSES/ORBITS: The mastoid air-cells and included paranasal sinuses are well-aerated. Atretic LEFT maxillary sinus seen with chronic sinusitis.The included ocular globes and orbital contents are non-suspicious. OTHER: None. IMPRESSION: 1. No acute intracranial process. 2. 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 3. Moderate chronic small vessel ischemic disease. Old small LEFT basal ganglia and cerebellar infarcts. 4. Moderate parenchymal brain volume loss. Electronically Signed   By: Elon Alas M.D.   On: 11/15/2017 02:34   US Carotid Bilateral  Result Date: 11/13/2017 CLINICAL DATA:  TIA EXAM:  BILATERAL CAROTID DUPLEX ULTRASOUND TECHNIQUE: Pearline Cables scale imaging, color Doppler and duplex ultrasound were performed of bilateral carotid and vertebral arteries in the neck. COMPARISON:  None. FINDINGS: Criteria: Quantification of carotid stenosis is based on velocity parameters that correlate the residual internal carotid diameter with NASCET-based stenosis levels, using the diameter of the distal internal carotid lumen as the denominator for stenosis measurement. The following velocity measurements were obtained: RIGHT ICA:  96 cm/sec CCA:  85 cm/sec SYSTOLIC ICA/CCA RATIO:  1.1 DIASTOLIC ICA/CCA RATIO: ECA:  91 cm/sec LEFT ICA:  81 cm/sec CCA:  89 cm/sec SYSTOLIC ICA/CCA RATIO:  0.9 DIASTOLIC ICA/CCA RATIO: ECA:  100 cm/sec RIGHT CAROTID ARTERY: Mild scattered calcified plaque in the upper common carotid and bulb. Low resistance internal carotid Doppler pattern. RIGHT VERTEBRAL ARTERY:  Antegrade. LEFT CAROTID ARTERY: Minimal calcified plaque in the bulb. Low resistance internal carotid Doppler pattern. LEFT VERTEBRAL ARTERY:  Antegrade. IMPRESSION: Less than 50% stenosis in the right and left internal carotid arteries. Electronically Signed   By: Marybelle Killings M.D.   On: 11/13/2017 15:26    Ct Head Code Stroke Wo Contrast`  Result Date: 11/14/2017 CLINICAL DATA:  Code stroke. 67 y/o F; sudden onset slurred speech after dialysis. EXAM: CT HEAD WITHOUT CONTRAST TECHNIQUE: Contiguous axial images were obtained from the base of the skull through the vertex without intravenous contrast. COMPARISON:  11/12/2017 MRI of the head and CT of the head. FINDINGS: Brain: No evidence of acute infarction, hemorrhage, hydrocephalus, extra-axial collection or mass lesion/mass effect. Stable cortical infarctions within the right anterolateral frontal lobe and left parietal lobe. Stable very small chronic infarctions within the right cerebellar hemisphere. Stable small chronic lacunar infarctions within bilateral basal ganglia. Stable chronic microvascular ischemic changes of white matter and parenchymal volume loss of the brain. Vascular: Calcific atherosclerosis of carotid siphons. No hyperdense vessel identified. Skull: Normal. Negative for fracture or focal lesion. Sinuses/Orbits: No acute finding. Other: None. ASPECTS [facility] Stroke Program Early CT Score) - Ganglionic level infarction (caudate, lentiform nuclei, internal capsule, insula, M1-M3 cortex): 7 - Supraganglionic infarction (M4-M6 cortex): 3 Total score (0-10 with 10 being normal): 10 IMPRESSION: 1. No acute intracranial abnormality identified. 2. Stable chronic microvascular ischemic changes of the brain, parenchymal volume loss, and chronic infarctions. 3. ASPECTS is 10 These results were called by telephone at the time of interpretation on 11/14/2017 at 9:32 pm to Dr. Alfred Levins, who verbally acknowledged these results. Electronically Signed   By: Kristine Garbe M.D.   On: 11/14/2017 21:36    EKG:   Orders placed or performed during the hospital encounter of 11/14/17  . ED EKG  . ED EKG      Management plans discussed with the patient, family and they are in agreement.  CODE STATUS:     Code Status Orders  (From admission,  onward)        Start     Ordered   11/15/17 0018  Full code  Continuous     11/15/17 0017    Code Status History    Date Active Date Inactive Code Status Order ID Comments User Context   11/12/2017 2219 11/13/2017 1935 Full Code [id number]  Lance Coon, MD ED   04/14/2016 1330 04/14/2016 1700 Full Code [id number]  Bettey Costa, MD ED   08/08/2015 2257 08/09/2015 2054 Full Code [id number]  Hower, Aaron Mose, MD ED   04/17/2015 1334 04/17/2015 1836 Full Code [id number]  Dew, Erskine Squibb, MD Inpatient      TOTAL TIME TAKING  CARE OF THIS PATIENT:  45  minutes.   Note: This dictation was prepared with Dragon dictation along with smaller phrase technology. Any transcriptional errors that result from this process are unintentional.   @[facility] @  on 11/17/2017 at 11:57 AM  Between 7am to 6pm - Pager - [phone]  After 6pm go to [email or web address] - password [email or web address] [place] [facility]  Office  [phone]  CC: Primary care physician; Lowella Bandy, MD

## 2017-11-20 NOTE — Telephone Encounter (Signed)
I left a message for the patient to call. 

## 2017-11-23 NOTE — Telephone Encounter (Signed)
Patient contacted regarding discharge from [facility] on 11/17/17.   Patient understands to follow up with provider ? On 12/06/17 at 2:40 pm at [place] [place].  Patient understands discharge instructions? Yes  Patient understands medications and regiment? Yes  Patient understands to bring all medications to this visit? Yes   Also s/w patient's family member who stated needed to reschedule appointment as patient has dialysis on MWF. R/s for 12/06/17 in the afternoon and he said patient will be able to be there for that appointment because dialysis will be done by then.

## 2017-11-28 ENCOUNTER — Ambulatory Visit: Payer: Medicare HMO

## 2017-12-04 ENCOUNTER — Ambulatory Visit: Payer: Medicare HMO | Admitting: Cardiovascular Disease

## 2017-12-06 ENCOUNTER — Ambulatory Visit: Payer: Medicare HMO | Admitting: Cardiovascular Disease

## 2017-12-06 NOTE — Progress Notes (Signed)
Cardiology Office Note  Date:  12/07/2017   ID:  Cynthia, Dean 1950/11/26, MRN [id number]  PCP:  Lowella Bandy, MD   Chief Complaint  Patient presents with  . OTHER    F/u [facility] Stroke no complaints today. Meds reviewed verbally with pt.    HPI:  Cynthia Dean is a 67 y.o. female who presents for Hypertension Diabetes Hyperlipidemia Coronary disease Aortic atherosclerosis CVA Recent hospitalization November 12 2017 percent he was slurred speech New small thalamic stroke Prior stroke 1 year earlier End-stage renal disease on hemodialysis She had TEE and loop monitor placement She presents for follow-up for stroke after recent hospitalization  She presented to the hospitalApril 2019 with Slurred speech,  Prior MRI showed punctate thalamic stroke, which on radiology read read acute punctate stroke versus artifact.  Repeat MRI no acute abnormalities continue aspirin and Plavix  TEE done4/25/19 -normal LV function no mural apical thrombus, no regional wall motion abnormalities normal LV function estimated EF 55%  LINQ placed, outpatient follow-up with [facility] cardiology as recommended by them  EEG-abnormal EEG secondary to intermittent periods of left hemisphericslowing and independent left and right temporal sharp transients. Findings consistent with the patient's history of bilateral MCA infarcts but may also suggest epileptogenic potential  HBA1C 7.1 Total chol 90  She feels back to normal, speech has improved Presenting in a wheelchair, legs feel weak, chronic baseline weakness  EKG personally reviewed by myself on todays visit Shows normal sinus rhythm rate 69 bpm no significant ST or T-wave changes   PMH:   has a past medical history of COPD (chronic obstructive pulmonary disease) ([place]), Coronary artery disease, Diabetes mellitus without complication ([place]), ESRD on dialysis ([place]), Heart murmur, Hyperlipidemia,  Hypertension, and Stroke ([place]).  PSH:    Past Surgical History:  Procedure Laterality Date  . A/V FISTULAGRAM Left 12/20/2016   Procedure: A/V Fistulagram;  Surgeon: Katha Cabal, MD;  Location: [place] [facility];  Service: Cardiovascular;  Laterality: Left;  . A/V SHUNT INTERVENTION N/A 12/20/2016   Procedure: A/V Shunt Intervention;  Surgeon: Katha Cabal, MD;  Location: [place] [facility];  Service: Cardiovascular;  Laterality: N/A;  . A/V SHUNTOGRAM Left 09/11/2017   Procedure: A/V SHUNTOGRAM;  Surgeon: Algernon Huxley, MD;  Location: [place] [facility];  Service: Cardiovascular;  Laterality: Left;  . BREAST BIOPSY Bilateral 07/19/00   neg  . LOOP RECORDER INSERTION N/A 11/16/2017   Procedure: LOOP RECORDER INSERTION;  Surgeon: Deboraha Sprang, MD;  Location: [place] [facility];  Service: Cardiovascular;  Laterality: N/A;  . PERIPHERAL VASCULAR CATHETERIZATION Left 02/02/2015   Procedure: A/V Shuntogram/Fistulagram;  Surgeon: Algernon Huxley, MD;  Location: [place] [facility];  Service: Cardiovascular;  Laterality: Left;  . PERIPHERAL VASCULAR CATHETERIZATION Left 02/02/2015   Procedure: A/V Shunt Intervention;  Surgeon: Algernon Huxley, MD;  Location: [place] [facility];  Service: Cardiovascular;  Laterality: Left;  . PERIPHERAL VASCULAR CATHETERIZATION Left 03/09/2015   Procedure: A/V Shuntogram/Fistulagram;  Surgeon: Algernon Huxley, MD;  Location: [place] [facility];  Service: Cardiovascular;  Laterality: Left;  . PERIPHERAL VASCULAR CATHETERIZATION N/A 03/09/2015   Procedure: A/V Shunt Intervention;  Surgeon: Algernon Huxley, MD;  Location: [place] [facility];  Service: Cardiovascular;  Laterality: N/A;  . PERIPHERAL VASCULAR CATHETERIZATION Left 04/17/2015   Procedure: Upper Extremity Angiography;  Surgeon: Algernon Huxley, MD;  Location: [place] [facility];  Service: Cardiovascular;  Laterality: Left;  .  PERIPHERAL VASCULAR CATHETERIZATION  04/17/2015   Procedure: Upper Extremity  Intervention;  Surgeon: Algernon Huxley, MD;  Location: [place] [facility];  Service: Cardiovascular;;  . PERIPHERAL VASCULAR CATHETERIZATION N/A 08/10/2015   Procedure: A/V Shuntogram/Fistulagram;  Surgeon: Algernon Huxley, MD;  Location: [place] [facility];  Service: Cardiovascular;  Laterality: N/A;  . PERIPHERAL VASCULAR CATHETERIZATION N/A 08/10/2015   Procedure: A/V Shunt Intervention;  Surgeon: Algernon Huxley, MD;  Location: [place] [facility];  Service: Cardiovascular;  Laterality: N/A;  . PERIPHERAL VASCULAR CATHETERIZATION Left 04/11/2016   Procedure: A/V Shuntogram/Fistulagram;  Surgeon: Algernon Huxley, MD;  Location: [place] [facility];  Service: Cardiovascular;  Laterality: Left;  . TEE WITHOUT CARDIOVERSION N/A 11/16/2017   Procedure: TRANSESOPHAGEAL ECHOCARDIOGRAM (TEE);  Surgeon: Minna Merritts, MD;  Location: [facility];  Service: Cardiovascular;  Laterality: N/A;    Current Outpatient Medications  Medication Sig Dispense Refill  . acetaminophen (TYLENOL) 325 MG tablet Take 2 tablets (650 mg total) by mouth every 4 (four) hours as needed for mild pain (or temp > 37.5 C (99.5 F)).    [name] albuterol-ipratropium (COMBIVENT) 18-103 MCG/ACT inhaler Inhale 2 puffs into the lungs 2 (two) times daily.    [name] aspirin EC 325 MG tablet Take 1 tablet (325 mg total) by mouth daily. 30 tablet 3  . atorvastatin (LIPITOR) 80 MG tablet Take 80 mg by mouth every evening.     . budesonide-formoterol (SYMBICORT) 160-4.5 MCG/ACT inhaler Inhale 2 puffs into the lungs 2 (two) times daily. 1 Inhaler 12  . cephALEXin (KEFLEX) 500 MG capsule Take 1 capsule (500 mg total) by mouth every 12 (twelve) hours. 5 capsule 0  . cinacalcet (SENSIPAR) 60 MG tablet Take 60 mg by mouth daily.    . cloNIDine (CATAPRES - DOSED IN MG/24 HR) 0.2 mg/24hr patch Place 1 patch onto the skin once a week.    . clopidogrel (PLAVIX) 75 MG tablet TAKE ONE TABLET BY MOUTH ONCE DAILY 30 tablet 5  . docusate sodium (COLACE) 100 MG capsule Take 1  capsule (100 mg total) by mouth 2 (two) times daily. 10 capsule 0  . gabapentin (NEURONTIN) 100 MG capsule Take 100 mg by mouth 3 (three) times daily.    . insulin NPH-regular Human (NOVOLIN 70/30) (70-30) 100 UNIT/ML injection Inject 25 Units into the skin 2 (two) times daily with a meal. Except dialysis days. Do not take on Tuesdays, Thursdays, and Saturdays. 10 mL 11  . ipratropium-albuterol (DUONEB) 0.5-2.5 (3) MG/3ML SOLN Take 3 mLs by nebulization every 6 (six) hours as needed (shortness of breath/ wheezing).     [name] lacosamide (VIMPAT) 50 MG TABS tablet Take 1 tablet (50 mg total) by mouth 2 (two) times daily. 60 tablet 0  . lidocaine-prilocaine (EMLA) cream Apply 1 application topically as needed (port access).     [name] losartan (COZAAR) 100 MG tablet Take 100 mg by mouth daily.    . metoprolol succinate (TOPROL-XL) 100 MG 24 hr tablet Take 100 mg by mouth every evening. Take with or immediately following a meal.     . mupirocin ointment (BACTROBAN) 2 % Apply to affected area 3 times daily for 5 days 22 g 0  . omeprazole (PRILOSEC) 20 MG capsule Take 20 mg by mouth 2 (two) times daily.     . polyethylene glycol (MIRALAX / GLYCOLAX) packet Take 17 g by mouth daily as needed for moderate constipation. 14 each 0  . sevelamer carbonate (RENVELA) 800 MG tablet Take  2,400 mg by mouth 3 (three) times daily with meals.     . torsemide (DEMADEX) 100 MG tablet Take 100 mg by mouth daily.     No current facility-administered medications for this visit.      Allergies:   Tape   Social History:  The patient  reports that she has quit smoking. Her smoking use included cigarettes. She smoked 0.00 packs per day. She has never used smokeless tobacco. She reports that she does not drink alcohol or use drugs.   Family History:   family history includes Breast cancer (age of onset: 39) in her cousin; Breast cancer (age of onset: 31) in her father; Heart Problems in her sister; Heart attack in her mother; Stroke  in her mother.    Review of Systems: Review of Systems  Constitutional: Negative.   Respiratory: Negative.   Cardiovascular: Negative.   Gastrointestinal: Negative.   Musculoskeletal: Negative.        Leg weakness  Neurological: Negative.   Psychiatric/Behavioral: Negative.   All other systems reviewed and are negative.    PHYSICAL EXAM: VS:  BP 104/64 (BP Location: Left Arm, Patient Position: Sitting, Cuff Size: Large)   Pulse 69   Ht 5\' 7"  (1.702 m)   Wt 270 lb (122.5 kg)   BMI 42.29 kg/m  , BMI Body mass index is 42.29 kg/m. Constitutional:  oriented to person, place, and time. No distress. obese, presenting a wheelchair HENT:  Head: Normocephalic and atraumatic.  Eyes:  no discharge. No scleral icterus.  Neck: Normal range of motion. Neck supple. No JVD present.  Cardiovascular: Normal rate, regular rhythm, normal heart sounds and intact distal pulses. Exam reveals no gallop and no friction rub. No edema No murmur heard. Pulmonary/Chest: Effort normal and breath sounds normal. No stridor. No respiratory distress.  no wheezes.  no rales.  no tenderness.  Abdominal: Soft.  no distension.  no tenderness.  Musculoskeletal: Normal range of motion.  no  tenderness or deformity.  Neurological:  normal muscle tone. Coordination normal. No atrophy Skin: Skin is warm and dry. No rash noted. not diaphoretic.  Psychiatric:  normal mood and affect. behavior is normal. Thought content normal.     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  Essential hypertension, benign Blood pressure is well controlled on today's visit. No changes made to the medications. She will monitor blood pressure at home and watch for orthostasis symptoms  Disposition:   F/U as needed She will follow-up with Dr. Caryl Comes  Details of  recent hospitalization discussed with her  Total encounter time more than 45 minutes  Greater than 50% was spent in counseling and coordination of care with the patient    Orders Placed This Encounter  Procedures  . EKG 12-Lead     Signed, Esmond Plants, M.D., Ph.D. 12/07/2017  [place], [place]

## 2017-12-07 ENCOUNTER — Encounter: Payer: Self-pay | Admitting: Cardiovascular Disease

## 2017-12-07 ENCOUNTER — Ambulatory Visit (INDEPENDENT_AMBULATORY_CARE_PROVIDER_SITE_OTHER): Payer: Medicare HMO | Admitting: Cardiovascular Disease

## 2017-12-07 VITALS — BP 104/64 | HR 69 | Ht 67.0 in | Wt 270.0 lb

## 2017-12-07 DIAGNOSIS — E1159 Type 2 diabetes mellitus with other circulatory complications: Secondary | ICD-10-CM | POA: Diagnosis not present

## 2017-12-07 DIAGNOSIS — N186 End stage renal disease: Secondary | ICD-10-CM

## 2017-12-07 DIAGNOSIS — I10 Essential (primary) hypertension: Secondary | ICD-10-CM | POA: Diagnosis not present

## 2017-12-07 DIAGNOSIS — Z992 Dependence on renal dialysis: Secondary | ICD-10-CM

## 2017-12-07 DIAGNOSIS — I6310 Cerebral infarction due to embolism of unspecified precerebral artery: Secondary | ICD-10-CM

## 2017-12-07 NOTE — Patient Instructions (Addendum)
Medication Instructions:   Please decrease the aspirin down to 81 mg daily  Labwork:  No new labs needed  Testing/Procedures:  No further testing at this time   Follow-Up: It was a pleasure seeing you in the office today. Please call us if you have new issues that need to be addressed before your next appt.  [phone]  Your physician wants you to follow-up in: 12 months as needed.  You will receive a reminder letter in the mail two months in advance. If you don't receive a letter, please call our office to schedule the follow-up appointment.  If you need a refill on your cardiac medications before your next appointment, please call your pharmacy.  For educational health videos Log in to : [email or web address] Or : [email or web address], password : [email or web address]

## 2017-12-12 ENCOUNTER — Ambulatory Visit: Payer: Medicare HMO

## 2017-12-19 ENCOUNTER — Ambulatory Visit (INDEPENDENT_AMBULATORY_CARE_PROVIDER_SITE_OTHER): Payer: Medicare HMO | Admitting: *Deleted

## 2017-12-19 ENCOUNTER — Encounter: Payer: Self-pay | Admitting: Internal Medicine

## 2017-12-19 ENCOUNTER — Ambulatory Visit (INDEPENDENT_AMBULATORY_CARE_PROVIDER_SITE_OTHER): Payer: Medicare HMO | Admitting: Internal Medicine

## 2017-12-19 VITALS — BP 114/58 | HR 61 | Ht 69.0 in | Wt 269.0 lb

## 2017-12-19 DIAGNOSIS — I6310 Cerebral infarction due to embolism of unspecified precerebral artery: Secondary | ICD-10-CM | POA: Diagnosis not present

## 2017-12-19 DIAGNOSIS — Z959 Presence of cardiac and vascular implant and graft, unspecified: Secondary | ICD-10-CM

## 2017-12-19 NOTE — Patient Instructions (Signed)
Medication Instructions: - Your physician recommends that you continue on your current medications as directed. Please refer to the Current Medication list given to you today.  Labwork: - none ordered  Procedures/Testing: - none ordered  Follow-Up: - Dr. Klein will see you back on an as needed basis.  Any Additional Special Instructions Will Be Listed Below (If Applicable).     If you need a refill on your cardiac medications before your next appointment, please call your pharmacy.   

## 2017-12-19 NOTE — Progress Notes (Signed)
Patient Care Team: Lowella Bandy, MD as PCP - General (Pediatrics)   HPI  Cynthia Dean is a 67 y.o. female Seen in followup for Cryptogenic Stroke(s)  for which she underwent ILR implant 4/19  DATE TEST EF   12/18 Echo   55-65 %   4/19 TEE  65 % No LAA clots         Some sob and tr edema but no chest pain  No bleeding on plavix  Denies snoring byut some daytime somnolence  Back pain is major limitation  Records and Results [place]  Past Medical History:  Diagnosis Date  . COPD (chronic obstructive pulmonary disease) ([place])   . Coronary artery disease   . Diabetes mellitus without complication ([place])   . ESRD on dialysis ([place])   . Heart murmur   . Hyperlipidemia   . Hypertension   . Stroke [facility])     Past Surgical History:  Procedure Laterality Date  . A/V FISTULAGRAM Left 12/20/2016   Procedure: A/V Fistulagram;  Surgeon: Katha Cabal, MD;  Location: [place] [facility];  Service: Cardiovascular;  Laterality: Left;  . A/V SHUNT INTERVENTION N/A 12/20/2016   Procedure: A/V Shunt Intervention;  Surgeon: Katha Cabal, MD;  Location: [place] [facility];  Service: Cardiovascular;  Laterality: N/A;  . A/V SHUNTOGRAM Left 09/11/2017   Procedure: A/V SHUNTOGRAM;  Surgeon: Algernon Huxley, MD;  Location: [place] [facility];  Service: Cardiovascular;  Laterality: Left;  . BREAST BIOPSY Bilateral 07/19/00   neg  . LOOP RECORDER INSERTION N/A 11/16/2017   Procedure: LOOP RECORDER INSERTION;  Surgeon: Deboraha Sprang, MD;  Location: [place] [facility];  Service: Cardiovascular;  Laterality: N/A;  . PERIPHERAL VASCULAR CATHETERIZATION Left 02/02/2015   Procedure: A/V Shuntogram/Fistulagram;  Surgeon: Algernon Huxley, MD;  Location: [place] [facility];  Service: Cardiovascular;  Laterality: Left;  . PERIPHERAL VASCULAR CATHETERIZATION Left 02/02/2015   Procedure: A/V Shunt Intervention;  Surgeon: Algernon Huxley, MD;  Location: [place] [facility];   Service: Cardiovascular;  Laterality: Left;  . PERIPHERAL VASCULAR CATHETERIZATION Left 03/09/2015   Procedure: A/V Shuntogram/Fistulagram;  Surgeon: Algernon Huxley, MD;  Location: [place] [facility];  Service: Cardiovascular;  Laterality: Left;  . PERIPHERAL VASCULAR CATHETERIZATION N/A 03/09/2015   Procedure: A/V Shunt Intervention;  Surgeon: Algernon Huxley, MD;  Location: [place] [facility];  Service: Cardiovascular;  Laterality: N/A;  . PERIPHERAL VASCULAR CATHETERIZATION Left 04/17/2015   Procedure: Upper Extremity Angiography;  Surgeon: Algernon Huxley, MD;  Location: [place] [facility];  Service: Cardiovascular;  Laterality: Left;  . PERIPHERAL VASCULAR CATHETERIZATION  04/17/2015   Procedure: Upper Extremity Intervention;  Surgeon: Algernon Huxley, MD;  Location: [place] [facility];  Service: Cardiovascular;;  . PERIPHERAL VASCULAR CATHETERIZATION N/A 08/10/2015   Procedure: A/V Shuntogram/Fistulagram;  Surgeon: Algernon Huxley, MD;  Location: [place] [facility];  Service: Cardiovascular;  Laterality: N/A;  . PERIPHERAL VASCULAR CATHETERIZATION N/A 08/10/2015   Procedure: A/V Shunt Intervention;  Surgeon: Algernon Huxley, MD;  Location: [place] [facility];  Service: Cardiovascular;  Laterality: N/A;  . PERIPHERAL VASCULAR CATHETERIZATION Left 04/11/2016   Procedure: A/V Shuntogram/Fistulagram;  Surgeon: Algernon Huxley, MD;  Location: [place] [facility];  Service: Cardiovascular;  Laterality: Left;  . TEE WITHOUT CARDIOVERSION N/A 11/16/2017   Procedure: TRANSESOPHAGEAL ECHOCARDIOGRAM (TEE);  Surgeon: Minna Merritts, MD;  Location: [facility];  Service: Cardiovascular;  Laterality: N/A;  Current Meds  Medication Sig  . acetaminophen (TYLENOL) 325 MG tablet Take 2 tablets (650 mg total) by mouth every 4 (four) hours as needed for mild pain (or temp > 37.5 C (99.5 F)).  [name] albuterol-ipratropium (COMBIVENT) 18-103 MCG/ACT inhaler Inhale 2 puffs into the lungs 2 (two) times daily.  [name] atorvastatin (LIPITOR)  80 MG tablet Take 80 mg by mouth every evening.   . budesonide-formoterol (SYMBICORT) 160-4.5 MCG/ACT inhaler Inhale 2 puffs into the lungs 2 (two) times daily.  . cinacalcet (SENSIPAR) 60 MG tablet Take 60 mg by mouth daily.  . cloNIDine (CATAPRES - DOSED IN MG/24 HR) 0.2 mg/24hr patch Place 1 patch onto the skin once a week.  . clopidogrel (PLAVIX) 75 MG tablet TAKE ONE TABLET BY MOUTH ONCE DAILY  . docusate sodium (COLACE) 100 MG capsule Take 1 capsule (100 mg total) by mouth 2 (two) times daily.  [name] gabapentin (NEURONTIN) 100 MG capsule Take 100 mg by mouth 3 (three) times daily.  . insulin NPH-regular Human (NOVOLIN 70/30) (70-30) 100 UNIT/ML injection Inject 25 Units into the skin 2 (two) times daily with a meal. Except dialysis days. Do not take on Tuesdays, Thursdays, and Saturdays.  [name] ipratropium-albuterol (DUONEB) 0.5-2.5 (3) MG/3ML SOLN Take 3 mLs by nebulization every 6 (six) hours as needed (shortness of breath/ wheezing).   [name] lacosamide (VIMPAT) 50 MG TABS tablet Take 1 tablet (50 mg total) by mouth 2 (two) times daily.  [name] lidocaine-prilocaine (EMLA) cream Apply 1 application topically as needed (port access).   [name] losartan (COZAAR) 100 MG tablet Take 100 mg by mouth daily.  . metoprolol succinate (TOPROL-XL) 100 MG 24 hr tablet Take 100 mg by mouth every evening. Take with or immediately following a meal.   . omeprazole (PRILOSEC) 20 MG capsule Take 20 mg by mouth 2 (two) times daily.   . polyethylene glycol (MIRALAX / GLYCOLAX) packet Take 17 g by mouth daily as needed for moderate constipation.  . sevelamer carbonate (RENVELA) 800 MG tablet Take 2,400 mg by mouth 3 (three) times daily with meals.   . torsemide (DEMADEX) 100 MG tablet Take 100 mg by mouth daily.    Allergies  Allergen Reactions  . Tape Itching    Skin Dermatitis/itching (tape adhesive)      Review of Systems negative except from HPI and PMH  Physical Exam BP (!) 114/58 (BP Location: Right Arm, Patient  Position: Sitting, Cuff Size: Large)   Pulse 61   Ht 5\' 9"  (1.753 m)   Wt 269 lb (122 kg)   BMI 39.72 kg/m  Well developed and well nourished in no acute distress HENT normal E scleral and icterus clear Neck Supple JVP flat; carotids brisk and full Clear to ausculation Regular rate and rhythm, no murmurs gallops or rub Soft with active bowel sounds No clubbing cyanosis  Edema Alert and oriented, grossly normal motor and sensory function Skin Warm and Dry  ECG Personally reviewed  Sinus with PACs Low amplitude p waves   Assessment and  Plan  Cryptogenic Stroke  Hypertension with hypertensive heart disease  Daytime somnolence  ESRD    No interval Afib  Continue plavix--the data would support the use of anticoagulation in ESRD lady given hx of prior stroke if afib were detected  The threshold is hard to know  No LH despite modestly low BP  She is on multiple meds and with daytime somnolence woul recommend she consider sleep study with PCP   We spent more than  50% of our >25 min visit in face to face counseling regarding the above   Current medicines are reviewed at length with the patient today .  The patient does not have concerns regarding medicines.

## 2017-12-19 NOTE — Progress Notes (Signed)
[facility]link Summary Report / Loop Recorder 

## 2018-01-10 LAB — CUP PACEART REMOTE DEVICE CHECK
Implantable Pulse Generator Implant Date: 20190425
MDC IDC SESS DTM: 20190525140640

## 2018-01-17 LAB — CUP PACEART INCLINIC DEVICE CHECK
Date Time Interrogation Session: 20190626104012
MDC IDC PG IMPLANT DT: 20190425

## 2018-01-18 ENCOUNTER — Ambulatory Visit (INDEPENDENT_AMBULATORY_CARE_PROVIDER_SITE_OTHER): Payer: Medicare HMO | Admitting: *Deleted

## 2018-01-18 DIAGNOSIS — I6310 Cerebral infarction due to embolism of unspecified precerebral artery: Secondary | ICD-10-CM | POA: Diagnosis not present

## 2018-01-18 NOTE — Progress Notes (Signed)
[facility]link Summary Report / Loop Recorder 

## 2018-01-20 ENCOUNTER — Ambulatory Visit
Admission: EM | Admit: 2018-01-20 | Discharge: 2018-01-20 | Disposition: A | Discharge status: Home / Self Care | Payer: Medicare HMO | Attending: Family Medicine | Admitting: Family Medicine

## 2018-01-20 ENCOUNTER — Encounter: Payer: Self-pay | Admitting: Gynecology

## 2018-01-20 ENCOUNTER — Other Ambulatory Visit: Payer: Self-pay

## 2018-01-20 DIAGNOSIS — J22 Unspecified acute lower respiratory infection: Secondary | ICD-10-CM | POA: Diagnosis not present

## 2018-01-20 DIAGNOSIS — R05 Cough: Secondary | ICD-10-CM

## 2018-01-20 DIAGNOSIS — R0602 Shortness of breath: Secondary | ICD-10-CM

## 2018-01-20 MED ORDER — IPRATROPIUM-ALBUTEROL 0.5-2.5 (3) MG/3ML IN SOLN
3.0000 mL | Freq: Once | RESPIRATORY_TRACT | Status: AC
  Administered 2018-01-20: 3 mL via RESPIRATORY_TRACT

## 2018-01-20 MED ORDER — AZITHROMYCIN 250 MG PO TABS: ORAL_TABLET | ORAL | 0 refills | Status: AC

## 2018-01-20 MED ORDER — PREDNISONE 20 MG PO TABS: 40.0000 mg | ORAL_TABLET | Freq: Every day | ORAL | 0 refills | Status: AC

## 2018-01-20 NOTE — ED Triage Notes (Signed)
Patient c/o cough x 2 weeks. Per patient blood in mucous when cough.

## 2018-01-20 NOTE — ED Provider Notes (Signed)
[facility]    CSN: [id number] Arrival date & time: 01/20/18  0808     History   Chief Complaint Chief Complaint  Patient presents with  . Cough    HPI Cynthia Dean is a 67 y.o. female.   [name] presents with complaints of cough which has been productive for the past two weeks. Runny nose. Shortness of breath only at times. Denies any current shortness of breath. No leg pain or swelling. No gi/gu complaints. No sore throat or ear pain. No chest pain  Or palpitations. No weakness. No known fevers. No known ill contacts. Has been taking otc cold medications which have not helped. History of copd and using inhalers. Hx of Stroke, cad, esrd. On blood thinner.    ROS per HPI.      Past Medical History:  Diagnosis Date  . COPD (chronic obstructive pulmonary disease) ([place])   . Coronary artery disease   . Diabetes mellitus without complication ([place])   . ESRD on dialysis ([place])   . Heart murmur   . Hyperlipidemia   . Hypertension   . Stroke [facility])     Patient Active Problem List   Diagnosis Date Noted  . Slurred speech 11/14/2017  . Diabetes ([place]) 11/12/2017  . CAD (coronary artery disease) 11/12/2017  . COPD (chronic obstructive pulmonary disease) ([place]) 11/12/2017  . ESRD on dialysis ([place]) 11/12/2017  . Hematemesis 06/27/2017  . CVA (cerebral vascular accident) ([place]) 04/14/2016  . Essential hypertension, benign 11/27/2015  . Hyperlipidemia 11/27/2015  . Obesity 11/27/2015  . Prolonged Q-T interval on ECG 11/26/2015  . Intractable nausea and vomiting 08/08/2015    Past Surgical History:  Procedure Laterality Date  . A/V FISTULAGRAM Left 12/20/2016   Procedure: A/V Fistulagram;  Surgeon: Katha Cabal, MD;  Location: [place] [facility];  Service: Cardiovascular;  Laterality: Left;  . A/V SHUNT INTERVENTION N/A 12/20/2016   Procedure: A/V Shunt Intervention;  Surgeon: Katha Cabal, MD;  Location: [place] [facility];  Service:  Cardiovascular;  Laterality: N/A;  . A/V SHUNTOGRAM Left 09/11/2017   Procedure: A/V SHUNTOGRAM;  Surgeon: Algernon Huxley, MD;  Location: [place] [facility];  Service: Cardiovascular;  Laterality: Left;  . BREAST BIOPSY Bilateral 07/19/00   neg  . LOOP RECORDER INSERTION N/A 11/16/2017   Procedure: LOOP RECORDER INSERTION;  Surgeon: Deboraha Sprang, MD;  Location: [place] [facility];  Service: Cardiovascular;  Laterality: N/A;  . PERIPHERAL VASCULAR CATHETERIZATION Left 02/02/2015   Procedure: A/V Shuntogram/Fistulagram;  Surgeon: Algernon Huxley, MD;  Location: [place] [facility];  Service: Cardiovascular;  Laterality: Left;  . PERIPHERAL VASCULAR CATHETERIZATION Left 02/02/2015   Procedure: A/V Shunt Intervention;  Surgeon: Algernon Huxley, MD;  Location: [place] [facility];  Service: Cardiovascular;  Laterality: Left;  . PERIPHERAL VASCULAR CATHETERIZATION Left 03/09/2015   Procedure: A/V Shuntogram/Fistulagram;  Surgeon: Algernon Huxley, MD;  Location: [place] [facility];  Service: Cardiovascular;  Laterality: Left;  . PERIPHERAL VASCULAR CATHETERIZATION N/A 03/09/2015   Procedure: A/V Shunt Intervention;  Surgeon: Algernon Huxley, MD;  Location: [place] [facility];  Service: Cardiovascular;  Laterality: N/A;  . PERIPHERAL VASCULAR CATHETERIZATION Left 04/17/2015   Procedure: Upper Extremity Angiography;  Surgeon: Algernon Huxley, MD;  Location: [place] [facility];  Service: Cardiovascular;  Laterality: Left;  . PERIPHERAL VASCULAR CATHETERIZATION  04/17/2015   Procedure: Upper Extremity Intervention;  Surgeon: Algernon Huxley, MD;  Location: [place] [facility];  Service:  Cardiovascular;;  . PERIPHERAL VASCULAR CATHETERIZATION N/A 08/10/2015   Procedure: A/V Shuntogram/Fistulagram;  Surgeon: Algernon Huxley, MD;  Location: [place] [facility];  Service: Cardiovascular;  Laterality: N/A;  . PERIPHERAL VASCULAR CATHETERIZATION N/A 08/10/2015   Procedure: A/V Shunt Intervention;  Surgeon: Algernon Huxley, MD;  Location:  [place] [facility];  Service: Cardiovascular;  Laterality: N/A;  . PERIPHERAL VASCULAR CATHETERIZATION Left 04/11/2016   Procedure: A/V Shuntogram/Fistulagram;  Surgeon: Algernon Huxley, MD;  Location: [place] [facility];  Service: Cardiovascular;  Laterality: Left;  . TEE WITHOUT CARDIOVERSION N/A 11/16/2017   Procedure: TRANSESOPHAGEAL ECHOCARDIOGRAM (TEE);  Surgeon: Minna Merritts, MD;  Location: [facility];  Service: Cardiovascular;  Laterality: N/A;    OB History   None      Home Medications    Prior to Admission medications   Medication Sig Start Date End Date Taking? Authorizing Provider  acetaminophen (TYLENOL) 325 MG tablet Take 2 tablets (650 mg total) by mouth every 4 (four) hours as needed for mild pain (or temp > 37.5 C (99.5 F)). 11/17/17  Yes Gouru, Illene Silver, MD  albuterol-ipratropium (COMBIVENT) 18-103 MCG/ACT inhaler Inhale 2 puffs into the lungs 2 (two) times daily.   Yes [provider]  atorvastatin (LIPITOR) 80 MG tablet Take 80 mg by mouth every evening.    Yes [provider]  budesonide-formoterol (SYMBICORT) 160-4.5 MCG/ACT inhaler Inhale 2 puffs into the lungs 2 (two) times daily. 07/01/17  Yes Wieting, Richard, MD  cinacalcet (SENSIPAR) 60 MG tablet Take 60 mg by mouth daily.   Yes [provider]  cloNIDine (CATAPRES - DOSED IN MG/24 HR) 0.2 mg/24hr patch Place 1 patch onto the skin once a week. 10/09/17  Yes [provider]  clopidogrel (PLAVIX) 75 MG tablet TAKE ONE TABLET BY MOUTH ONCE DAILY 06/14/16  Yes Stegmayer, Joelene Millin A, PA-C  docusate sodium (COLACE) 100 MG capsule Take 1 capsule (100 mg total) by mouth 2 (two) times daily. 11/17/17  Yes Gouru, Illene Silver, MD  gabapentin (NEURONTIN) 100 MG capsule Take 100 mg by mouth 3 (three) times daily.   Yes [provider]  insulin NPH-regular Human (NOVOLIN 70/30) (70-30) 100 UNIT/ML injection Inject 25 Units into the skin 2 (two) times daily with a meal. Except dialysis days. Do  not take on Tuesdays, Thursdays, and Saturdays. 11/17/17  Yes Gouru, Aruna, MD  ipratropium-albuterol (DUONEB) 0.5-2.5 (3) MG/3ML SOLN Take 3 mLs by nebulization every 6 (six) hours as needed (shortness of breath/ wheezing).    Yes [provider]  lidocaine-prilocaine (EMLA) cream Apply 1 application topically as needed (port access).    Yes [provider]  losartan (COZAAR) 100 MG tablet Take 100 mg by mouth daily. 09/12/17  Yes [provider]  metoprolol succinate (TOPROL-XL) 100 MG 24 hr tablet Take 100 mg by mouth every evening. Take with or immediately following a meal.    Yes [provider]  omeprazole (PRILOSEC) 20 MG capsule Take 20 mg by mouth 2 (two) times daily.    Yes [provider]  polyethylene glycol (MIRALAX / GLYCOLAX) packet Take 17 g by mouth daily as needed for moderate constipation. 11/17/17  Yes Gouru, Illene Silver, MD  sevelamer carbonate (RENVELA) 800 MG tablet Take 2,400 mg by mouth 3 (three) times daily with meals.    Yes [provider]  torsemide (DEMADEX) 100 MG tablet Take 100 mg by mouth daily. 10/10/17  Yes [provider]  azithromycin (ZITHROMAX) 250 MG tablet Take 2 tablets (  500 mg total) by mouth daily for 1 day, THEN 1 tablet (250 mg total) daily for 4 days. 01/20/18 01/25/18  Zigmund Gottron, NP  lacosamide (VIMPAT) 50 MG TABS tablet Take 1 tablet (50 mg total) by mouth 2 (two) times daily. 11/17/17   Gouru, Illene Silver, MD  predniSONE (DELTASONE) 20 MG tablet Take 2 tablets (40 mg total) by mouth daily with breakfast for 3 days. 01/20/18 01/23/18  Zigmund Gottron, NP    Family History Family History  Problem Relation Age of Onset  . Breast cancer Father 47  . Stroke Mother   . Heart attack Mother   . Heart Problems Sister   . Breast cancer Cousin 41       1 st cousin. Maternal     Social History Social History   Tobacco Use  . Smoking status: Former Smoker    Packs/day: 0.00    Types: Cigarettes  .  Smokeless tobacco: Never Used  Substance Use Topics  . Alcohol use: No  . Drug use: No     Allergies   Tape   Review of Systems Review of Systems   Physical Exam Triage Vital Signs ED Triage Vitals  Enc Vitals Group     BP 01/20/18 0824 132/77     Pulse Rate 01/20/18 0824 66     Resp 01/20/18 0824 18     Temp 01/20/18 0824 98.9 F (37.2 C)     Temp Source 01/20/18 0824 Oral     SpO2 01/20/18 0824 93 %     Weight 01/20/18 0825 260 lb (117.9 kg)     Height 01/20/18 0825 5\' 9"  (1.753 m)     Head Circumference --      Peak Flow --      Pain Score 01/20/18 0825 0     Pain Loc --      Pain Edu? --      Excl. in [place]? --    No data found.  Updated Vital Signs BP 132/77 (BP Location: Right Arm)   Pulse 66   Temp 98.9 F (37.2 C) (Oral)   Resp 18   Ht 5\' 9"  (1.753 m)   Wt 260 lb (117.9 kg)   SpO2 93%   BMI 38.40 kg/m    Physical Exam  Constitutional: She is oriented to person, place, and time. She appears well-developed and well-nourished. No distress.  HENT:  Head: Normocephalic and atraumatic.  Right Ear: Tympanic membrane, external ear and ear canal normal.  Left Ear: Tympanic membrane, external ear and ear canal normal.  Nose: Nose normal.  Mouth/Throat: Uvula is midline, oropharynx is clear and moist and mucous membranes are normal. No tonsillar exudate.  Eyes: Pupils are equal, round, and reactive to light. Conjunctivae and EOM are normal.  Cardiovascular: Normal rate and regular rhythm.  Pulmonary/Chest: Effort normal. No tachypnea and no bradypnea. No respiratory distress. She has decreased breath sounds.  No cough throughout exam; no increased work of breathing; decreased lung sounds with initial auscultation with clearing and improvement with duoneb provision.   Neurological: She is alert and oriented to person, place, and time.  Skin: Skin is warm and dry.     UC Treatments / Results  Labs (all labs ordered are listed, but only abnormal results are  displayed) Labs Reviewed - No data to display  EKG None  Radiology No results found.  Procedures Procedures (including critical care time)  Medications Ordered in UC Medications  ipratropium-albuterol (DUONEB) 0.5-2.5 (3) MG/3ML  nebulizer solution 3 mL (3 mLs Nebulization Given 01/20/18 0839)    Initial Impression / Assessment and Plan / UC Course  I have reviewed the triage vital signs and the nursing notes.  Pertinent labs & imaging results that were available during my care of the patient were reviewed by me and considered in my medical decision making (see chart for details).     Non toxic in appearance at this time. Afebrile. Without tachypnea or tachycardia. o2 93% prior to duoneb with significant improvement of lung sounds s/p breathing treatment. Without any cardiac or red flag findings. Will treat empirically with azithromycin, three days of prednisone provided. Monitor blood sugars recommended. Return precautions provided. Patient verbalized understanding and agreeable to plan.  Ambulatory out of clinic without difficulty.    Final Clinical Impressions(s) / UC Diagnoses   Final diagnoses:  Lower respiratory infection     Discharge Instructions     Please complete course of antibiotics as prescribed.  We will also add three days of oral steroids. Please monitor your blood sugar while taking this.  Continue with use of your inhalers as prescribed.  Please follow up with your primary care provider for recheck in the next 1-2 weeks. If develop worsening of cough, shortness of breath , chest pain, fevers, or otherwise worsening please return or go to the Er.    ED Prescriptions    Medication Sig Dispense Auth. Provider   azithromycin (ZITHROMAX) 250 MG tablet Take 2 tablets (500 mg total) by mouth daily for 1 day, THEN 1 tablet (250 mg total) daily for 4 days. 6 tablet Augusto Gamble B, NP   predniSONE (DELTASONE) 20 MG tablet Take 2 tablets (40 mg total) by mouth  daily with breakfast for 3 days. 6 tablet Zigmund Gottron, NP     Controlled Substance Prescriptions [place] Controlled Substance Registry consulted? Not Applicable   Zigmund Gottron, NP 01/20/18 [phone]

## 2018-01-20 NOTE — Discharge Instructions (Addendum)
Please complete course of antibiotics as prescribed.  We will also add three days of oral steroids. Please monitor your blood sugar while taking this.  Continue with use of your inhalers as prescribed.  Please follow up with your primary care provider for recheck in the next 1-2 weeks. If develop worsening of cough, shortness of breath , chest pain, fevers, or otherwise worsening please return or go to the Er.

## 2018-02-08 ENCOUNTER — Other Ambulatory Visit: Payer: Self-pay | Admitting: Internal Medicine

## 2018-02-16 LAB — CUP PACEART REMOTE DEVICE CHECK
Date Time Interrogation Session: 20190627140853
MDC IDC PG IMPLANT DT: 20190425

## 2018-02-20 ENCOUNTER — Ambulatory Visit (INDEPENDENT_AMBULATORY_CARE_PROVIDER_SITE_OTHER): Payer: Medicare HMO | Admitting: *Deleted

## 2018-02-20 DIAGNOSIS — I6310 Cerebral infarction due to embolism of unspecified precerebral artery: Secondary | ICD-10-CM | POA: Diagnosis not present

## 2018-02-20 NOTE — Progress Notes (Signed)
[facility]link Summary Report / Loop Recorder 

## 2018-03-07 ENCOUNTER — Encounter: Payer: Self-pay | Admitting: Emergency Medicine

## 2018-03-07 ENCOUNTER — Emergency Department: Payer: Medicare HMO

## 2018-03-07 ENCOUNTER — Inpatient Hospital Stay
Admission: EM | Admit: 2018-03-07 | Discharge: 2018-03-09 | DRG: 064 | Disposition: A | Discharge status: Home Health | Payer: Medicare HMO | Attending: Internal Medicine | Admitting: Internal Medicine

## 2018-03-07 ENCOUNTER — Other Ambulatory Visit: Payer: Self-pay

## 2018-03-07 DIAGNOSIS — Z794 Long term (current) use of insulin: Secondary | ICD-10-CM | POA: Diagnosis not present

## 2018-03-07 DIAGNOSIS — D631 Anemia in chronic kidney disease: Secondary | ICD-10-CM | POA: Diagnosis present

## 2018-03-07 DIAGNOSIS — E1122 Type 2 diabetes mellitus with diabetic chronic kidney disease: Secondary | ICD-10-CM | POA: Diagnosis present

## 2018-03-07 DIAGNOSIS — I272 Pulmonary hypertension, unspecified: Secondary | ICD-10-CM | POA: Diagnosis present

## 2018-03-07 DIAGNOSIS — R0602 Shortness of breath: Secondary | ICD-10-CM | POA: Diagnosis not present

## 2018-03-07 DIAGNOSIS — N186 End stage renal disease: Secondary | ICD-10-CM | POA: Diagnosis present

## 2018-03-07 DIAGNOSIS — Z8673 Personal history of transient ischemic attack (TIA), and cerebral infarction without residual deficits: Secondary | ICD-10-CM | POA: Diagnosis not present

## 2018-03-07 DIAGNOSIS — Z7951 Long term (current) use of inhaled steroids: Secondary | ICD-10-CM

## 2018-03-07 DIAGNOSIS — Z992 Dependence on renal dialysis: Secondary | ICD-10-CM | POA: Diagnosis not present

## 2018-03-07 DIAGNOSIS — Z6841 Body Mass Index (BMI) 40.0 and over, adult: Secondary | ICD-10-CM

## 2018-03-07 DIAGNOSIS — E1159 Type 2 diabetes mellitus with other circulatory complications: Secondary | ICD-10-CM

## 2018-03-07 DIAGNOSIS — R4781 Slurred speech: Secondary | ICD-10-CM | POA: Diagnosis present

## 2018-03-07 DIAGNOSIS — E785 Hyperlipidemia, unspecified: Secondary | ICD-10-CM | POA: Diagnosis present

## 2018-03-07 DIAGNOSIS — E11319 Type 2 diabetes mellitus with unspecified diabetic retinopathy without macular edema: Secondary | ICD-10-CM | POA: Diagnosis present

## 2018-03-07 DIAGNOSIS — Z87891 Personal history of nicotine dependence: Secondary | ICD-10-CM | POA: Diagnosis not present

## 2018-03-07 DIAGNOSIS — N2581 Secondary hyperparathyroidism of renal origin: Secondary | ICD-10-CM | POA: Diagnosis present

## 2018-03-07 DIAGNOSIS — J449 Chronic obstructive pulmonary disease, unspecified: Secondary | ICD-10-CM | POA: Diagnosis present

## 2018-03-07 DIAGNOSIS — I132 Hypertensive heart and chronic kidney disease with heart failure and with stage 5 chronic kidney disease, or end stage renal disease: Secondary | ICD-10-CM | POA: Diagnosis present

## 2018-03-07 DIAGNOSIS — I639 Cerebral infarction, unspecified: Principal | ICD-10-CM | POA: Diagnosis present

## 2018-03-07 DIAGNOSIS — Z7902 Long term (current) use of antithrombotics/antiplatelets: Secondary | ICD-10-CM

## 2018-03-07 DIAGNOSIS — I5032 Chronic diastolic (congestive) heart failure: Secondary | ICD-10-CM | POA: Diagnosis present

## 2018-03-07 DIAGNOSIS — I2510 Atherosclerotic heart disease of native coronary artery without angina pectoris: Secondary | ICD-10-CM | POA: Diagnosis present

## 2018-03-07 DIAGNOSIS — Z79899 Other long term (current) drug therapy: Secondary | ICD-10-CM | POA: Diagnosis not present

## 2018-03-07 DIAGNOSIS — E1140 Type 2 diabetes mellitus with diabetic neuropathy, unspecified: Secondary | ICD-10-CM | POA: Diagnosis present

## 2018-03-07 LAB — DIFFERENTIAL
BASOS ABS: 0.1 10*3/uL (ref 0–0.1)
BASOS PCT: 1 %
Eosinophils Absolute: 0.1 10*3/uL (ref 0–0.7)
Eosinophils Relative: 1 %
Lymphocytes Relative: 23 %
Lymphs Abs: 1.9 10*3/uL (ref 1.0–3.6)
MONO ABS: 0.7 10*3/uL (ref 0.2–0.9)
MONOS PCT: 9 %
NEUTROS ABS: 5.4 10*3/uL (ref 1.4–6.5)
Neutrophils Relative %: 66 %

## 2018-03-07 LAB — COMPREHENSIVE METABOLIC PANEL
ALT: 17 U/L (ref 0–44)
AST: 19 U/L (ref 15–41)
Albumin: 4.3 g/dL (ref 3.5–5.0)
Alkaline Phosphatase: 130 U/L — ABNORMAL HIGH (ref 38–126)
Anion gap: 14 (ref 5–15)
BUN: 23 mg/dL (ref 8–23)
CHLORIDE: 94 mmol/L — AB (ref 98–111)
CO2: 29 mmol/L (ref 22–32)
Calcium: 8.4 mg/dL — ABNORMAL LOW (ref 8.9–10.3)
Creatinine, Ser: 6.17 mg/dL — ABNORMAL HIGH (ref 0.44–1.00)
GFR, EST AFRICAN AMERICAN: 7 mL/min — AB (ref >60)
GFR, EST NON AFRICAN AMERICAN: 6 mL/min — AB (ref >60)
Glucose, Bld: 147 mg/dL — ABNORMAL HIGH (ref 70–99)
Potassium: 3.9 mmol/L (ref 3.5–5.1)
SODIUM: 137 mmol/L (ref 135–145)
Total Bilirubin: 0.6 mg/dL (ref 0.3–1.2)
Total Protein: 8.5 g/dL — ABNORMAL HIGH (ref 6.5–8.1)

## 2018-03-07 LAB — CBC
HEMATOCRIT: 43.3 % (ref 35.0–47.0)
Hemoglobin: 14.4 g/dL (ref 12.0–16.0)
MCH: 29.2 pg (ref 26.0–34.0)
MCHC: 33.1 g/dL (ref 32.0–36.0)
MCV: 88.2 fL (ref 80.0–100.0)
PLATELETS: 217 10*3/uL (ref 150–440)
RBC: 4.91 MIL/uL (ref 3.80–5.20)
RDW: 16 % — AB (ref 11.5–14.5)
WBC: 8.2 10*3/uL (ref 3.6–11.0)

## 2018-03-07 LAB — MAGNESIUM: MAGNESIUM: 2 mg/dL (ref 1.7–2.4)

## 2018-03-07 LAB — TROPONIN I

## 2018-03-07 LAB — GLUCOSE, CAPILLARY: Glucose-Capillary: 149 mg/dL — ABNORMAL HIGH (ref 70–99)

## 2018-03-07 MED ORDER — DEXTROSE 5 % IV SOLN
250.0000 mg | Freq: Once | INTRAVENOUS | Status: AC
  Administered 2018-03-07: 250 mg via INTRAVENOUS
  Filled 2018-03-07: qty 2.5

## 2018-03-07 MED ORDER — ASPIRIN 81 MG PO CHEW
324.0000 mg | CHEWABLE_TABLET | Freq: Once | ORAL | Status: AC
  Administered 2018-03-07: 324 mg via ORAL
  Filled 2018-03-07: qty 4

## 2018-03-07 NOTE — ED Triage Notes (Addendum)
Patient had dialysis treatment this morning and after completing treatment, staff noted that patient was slow to respond and had some slurring of speech. Per family, patient was last seen well at 6 am, prior to receiving treatment. Upon arrival to ED, patient's family reports speech is still slurred and patient is mildly confused. Patient unable to give year, however no weakness noted and face is symmetrical. Per family, patient has had episodes like this in the past when she was not feeling well prior to a treatment.

## 2018-03-07 NOTE — ED Notes (Signed)
John RN aware of bed assigned

## 2018-03-07 NOTE — ED Notes (Signed)
Report off to john rn.   

## 2018-03-07 NOTE — ED Provider Notes (Signed)
[facility] Emergency Department Provider Note   ____________________________________________   First MD Initiated Contact with Patient 03/07/18 1530     (approximate)  I have reviewed the triage vital signs and the nursing notes.   HISTORY  Chief Complaint Trouble speaking   HPI Cynthia Dean is a 67 y.o. female on dialysis had dialysis today.  Previous strokes.  Questionable history of possible seizure disorder not currently on any antiepileptic    Patient reports at 1030 this morning at the end of dialysis she began experiencing trouble difficulty speaking.  No other symptoms, and she is been able to understand speech well.  She reports she just cannot seem to always make the words that she wants to.  No trouble with vision.  No weakness in arms or legs.  No facial droop.  No headache.  No other symptoms.  No chest pain no trouble breathing.  She reports is been persistent and for about the last at least 5 hours she has not been able to speak clearly.  Husband at the bedside reports she normally speaks very clearly but she has not been able to speak clearly since about 10 or 1030 this morning  Additionally, this has happened before and she was admitted to the hospital a couple months ago for same.  She was discharged with medications, but was not able to afford some of them because of the high cost for which she thinks this might be the medication for for possible seizure they were given Vimpat.   Past Medical History:  Diagnosis Date  . COPD (chronic obstructive pulmonary disease) ([place])   . Coronary artery disease   . Diabetes mellitus without complication ([place])   . ESRD on dialysis ([place])   . Heart murmur   . Hyperlipidemia   . Hypertension   . Stroke [facility])     Patient Active Problem List   Diagnosis Date Noted  . Slurred speech 11/14/2017  . Diabetes ([place]) 11/12/2017  . CAD (coronary artery disease) 11/12/2017  . COPD (chronic  obstructive pulmonary disease) ([place]) 11/12/2017  . ESRD on dialysis ([place]) 11/12/2017  . Hematemesis 06/27/2017  . CVA (cerebral vascular accident) ([place]) 04/14/2016  . Essential hypertension, benign 11/27/2015  . Hyperlipidemia 11/27/2015  . Obesity 11/27/2015  . Prolonged Q-T interval on ECG 11/26/2015  . Intractable nausea and vomiting 08/08/2015    Past Surgical History:  Procedure Laterality Date  . A/V FISTULAGRAM Left 12/20/2016   Procedure: A/V Fistulagram;  Surgeon: Katha Cabal, MD;  Location: [place] [facility];  Service: Cardiovascular;  Laterality: Left;  . A/V SHUNT INTERVENTION N/A 12/20/2016   Procedure: A/V Shunt Intervention;  Surgeon: Katha Cabal, MD;  Location: [place] [facility];  Service: Cardiovascular;  Laterality: N/A;  . A/V SHUNTOGRAM Left 09/11/2017   Procedure: A/V SHUNTOGRAM;  Surgeon: Algernon Huxley, MD;  Location: [place] [facility];  Service: Cardiovascular;  Laterality: Left;  . BREAST BIOPSY Bilateral 07/19/00   neg  . LOOP RECORDER INSERTION N/A 11/16/2017   Procedure: LOOP RECORDER INSERTION;  Surgeon: Deboraha Sprang, MD;  Location: [place] [facility];  Service: Cardiovascular;  Laterality: N/A;  . PERIPHERAL VASCULAR CATHETERIZATION Left 02/02/2015   Procedure: A/V Shuntogram/Fistulagram;  Surgeon: Algernon Huxley, MD;  Location: [place] [facility];  Service: Cardiovascular;  Laterality: Left;  . PERIPHERAL VASCULAR CATHETERIZATION Left 02/02/2015   Procedure: A/V Shunt Intervention;  Surgeon: Algernon Huxley, MD;  Location: [place] [facility];  Service: Cardiovascular;  Laterality: Left;  . PERIPHERAL VASCULAR CATHETERIZATION Left 03/09/2015   Procedure: A/V Shuntogram/Fistulagram;  Surgeon: Algernon Huxley, MD;  Location: [place] [facility];  Service: Cardiovascular;  Laterality: Left;  . PERIPHERAL VASCULAR CATHETERIZATION N/A 03/09/2015   Procedure: A/V Shunt Intervention;  Surgeon: Algernon Huxley, MD;  Location: [place] [facility];  Service:  Cardiovascular;  Laterality: N/A;  . PERIPHERAL VASCULAR CATHETERIZATION Left 04/17/2015   Procedure: Upper Extremity Angiography;  Surgeon: Algernon Huxley, MD;  Location: [place] [facility];  Service: Cardiovascular;  Laterality: Left;  . PERIPHERAL VASCULAR CATHETERIZATION  04/17/2015   Procedure: Upper Extremity Intervention;  Surgeon: Algernon Huxley, MD;  Location: [place] [facility];  Service: Cardiovascular;;  . PERIPHERAL VASCULAR CATHETERIZATION N/A 08/10/2015   Procedure: A/V Shuntogram/Fistulagram;  Surgeon: Algernon Huxley, MD;  Location: [place] [facility];  Service: Cardiovascular;  Laterality: N/A;  . PERIPHERAL VASCULAR CATHETERIZATION N/A 08/10/2015   Procedure: A/V Shunt Intervention;  Surgeon: Algernon Huxley, MD;  Location: [place] [facility];  Service: Cardiovascular;  Laterality: N/A;  . PERIPHERAL VASCULAR CATHETERIZATION Left 04/11/2016   Procedure: A/V Shuntogram/Fistulagram;  Surgeon: Algernon Huxley, MD;  Location: [place] [facility];  Service: Cardiovascular;  Laterality: Left;  . TEE WITHOUT CARDIOVERSION N/A 11/16/2017   Procedure: TRANSESOPHAGEAL ECHOCARDIOGRAM (TEE);  Surgeon: Minna Merritts, MD;  Location: [facility];  Service: Cardiovascular;  Laterality: N/A;    Prior to Admission medications   Medication Sig Start Date End Date Taking? Authorizing Provider  acetaminophen (TYLENOL) 325 MG tablet Take 2 tablets (650 mg total) by mouth every 4 (four) hours as needed for mild pain (or temp > 37.5 C (99.5 F)). 11/17/17  Yes Gouru, Illene Silver, MD  albuterol-ipratropium (COMBIVENT) 18-103 MCG/ACT inhaler Inhale 2 puffs into the lungs 2 (two) times daily.   Yes [provider]  atorvastatin (LIPITOR) 80 MG tablet Take 80 mg by mouth every evening.    Yes [provider]  budesonide-formoterol (SYMBICORT) 160-4.5 MCG/ACT inhaler Inhale 2 puffs into the lungs 2 (two) times daily. 07/01/17  Yes Wieting, Richard, MD  cinacalcet (SENSIPAR) 60 MG tablet Take 60 mg by mouth  daily.   Yes [provider]  clopidogrel (PLAVIX) 75 MG tablet TAKE ONE TABLET BY MOUTH ONCE DAILY Patient taking differently: Take 75 mg by mouth daily.  06/14/16  Yes Stegmayer, Joelene Millin A, PA-C  gabapentin (NEURONTIN) 100 MG capsule Take 100 mg by mouth 3 (three) times daily.   Yes [provider]  insulin NPH-regular Human (NOVOLIN 70/30) (70-30) 100 UNIT/ML injection Inject 25 Units into the skin 2 (two) times daily with a meal. Except dialysis days. Do not take on Tuesdays, Thursdays, and Saturdays. 11/17/17  Yes Gouru, Aruna, MD  ipratropium-albuterol (DUONEB) 0.5-2.5 (3) MG/3ML SOLN Take 3 mLs by nebulization every 6 (six) hours as needed (shortness of breath/ wheezing).    Yes [provider]  lidocaine-prilocaine (EMLA) cream Apply 1 application topically as needed (port access).    Yes [provider]  losartan (COZAAR) 100 MG tablet Take 100 mg by mouth daily. 09/12/17  Yes [provider]  metoprolol succinate (TOPROL-XL) 100 MG 24 hr tablet Take 100 mg by mouth every evening. Take with or immediately following a meal.    Yes [provider]  omeprazole (PRILOSEC) 20 MG capsule Take 20 mg by mouth 2 (two) times daily.    Yes [provider]  polyethylene glycol (MIRALAX / GLYCOLAX) packet Take  17 g by mouth daily as needed for moderate constipation. 11/17/17  Yes Gouru, Illene Silver, MD  sevelamer carbonate (RENVELA) 800 MG tablet Take 2,400 mg by mouth 3 (three) times daily with meals.    Yes [provider]  torsemide (DEMADEX) 100 MG tablet Take 100 mg by mouth daily. 10/10/17  Yes [provider]  docusate sodium (COLACE) 100 MG capsule Take 1 capsule (100 mg total) by mouth 2 (two) times daily. Patient not taking: Reported on 03/07/2018 11/17/17   Nicholes Mango, MD  lacosamide (VIMPAT) 50 MG TABS tablet Take 1 tablet (50 mg total) by mouth 2 (two) times daily. Patient not taking: Reported on 03/07/2018 11/17/17    Nicholes Mango, MD    Allergies Tape  Family History  Problem Relation Age of Onset  . Breast cancer Father 9  . Stroke Mother   . Heart attack Mother   . Heart Problems Sister   . Breast cancer Cousin 63       1 st cousin. Maternal     Social History Social History   Tobacco Use  . Smoking status: Former Smoker    Packs/day: 0.00    Types: Cigarettes  . Smokeless tobacco: Never Used  Substance Use Topics  . Alcohol use: No  . Drug use: No    Review of Systems Constitutional: No fever/chills Eyes: No visual changes. ENT: No sore throat. Cardiovascular: Denies chest pain. Respiratory: Denies shortness of breath. Gastrointestinal: No abdominal pain.  No nausea, no vomiting.   Genitourinary: Negative for dysuria. Musculoskeletal: Negative for back pain. Skin: Negative for rash. Neurological: Negative for headaches, focal weakness or numbness.  See HPI    ____________________________________________   PHYSICAL EXAM:  VITAL SIGNS: ED Triage Vitals  Enc Vitals Group     BP 03/07/18 1157 (!) 143/61     Pulse Rate 03/07/18 1157 67     Resp 03/07/18 1157 (!) 22     Temp 03/07/18 1157 97.8 F (36.6 C)     Temp Source 03/07/18 1157 Oral     SpO2 03/07/18 1157 98 %     Weight 03/07/18 1208 259 lb 14.8 oz (117.9 kg)     Height 03/07/18 1158 5\' 6"  (1.676 m)     Head Circumference --      Peak Flow --      Pain Score 03/07/18 1158 0     Pain Loc --      Pain Edu? --      Excl. in [place]? --     Constitutional: Alert and oriented. Well appearing and in no acute distress.  She and her husband both very pleasant. Eyes: Conjunctivae are normal. Head: Atraumatic. Nose: No congestion/rhinnorhea. Mouth/Throat: Mucous membranes are moist. Neck: No stridor.   Cardiovascular: Normal rate, regular rhythm. Grossly normal heart sounds.  Good peripheral circulation. Respiratory: Normal respiratory effort.  No retractions. Lungs CTAB. Gastrointestinal: Soft and nontender. No  distention. Musculoskeletal: No lower extremity tenderness nor edema. Neurologic:  Normal speech and language. No gross focal neurologic deficits are appreciated.  She moves all extremities with normal strength.  Denies focal numbness.  Patient smile appears symmetric.  She does not have any clear dysarthria, she does have occasional difficulty finding words however with what appears to be expressive aphasia which is partial.  She does not appear to have any receptive aphasia and follows all commands and carries on conversation is normal except for difficulty finding her words at times Skin:  Skin is warm, dry and  intact. No rash noted. Psychiatric: Mood and affect are normal. Speech and behavior are normal.  ____________________________________________   LABS (all labs ordered are listed, but only abnormal results are displayed)  Labs Reviewed  GLUCOSE, CAPILLARY - Abnormal; Notable for the following components:      Result Value   Glucose-Capillary 149 (*)    All other components within normal limits  CBC - Abnormal; Notable for the following components:   RDW 16.0 (*)    All other components within normal limits  COMPREHENSIVE METABOLIC PANEL - Abnormal; Notable for the following components:   Chloride 94 (*)    Glucose, Bld 147 (*)    Creatinine, Ser 6.17 (*)    Calcium 8.4 (*)    Total Protein 8.5 (*)    Alkaline Phosphatase 130 (*)    GFR calc non Af Amer 6 (*)    GFR calc Af Amer 7 (*)    All other components within normal limits  DIFFERENTIAL  TROPONIN I  MAGNESIUM  CBG MONITORING, ED   ____________________________________________  EKG  Reviewed enterotomy at 1230 Heart rate 70 QRS 90 QTc 490 Normal sinus rhythm with moderate prolongation of QT ____________________________________________  RADIOLOGY    CT scan result reviewed by me. ____________________________________________   PROCEDURES  Procedure(s) performed: None  Procedures  Critical Care  performed: No  ____________________________________________   INITIAL IMPRESSION / ASSESSMENT AND PLAN / ED COURSE  Pertinent labs & imaging results that were available during my care of the patient were reviewed by me and considered in my medical decision making (see chart for details).  Patient presents for evaluation for difficulty speaking.  By exam I do not find any neurologic deficit except for what appears to be a somewhat focal and expressive components of a aphasia.  She does not have any evidence of cardiac etiology, her lab work is reassuring and she had her dialysis today.  She has no other symptoms associated, and review her records indicates prior evaluation for similar thought possibly due to his seizure or potentially small TIAs or ischemic events.  She has now had an implanted loop recorder placed as well.  Clinical Course as of Mar 07 2036  Wed Mar 07, 2018  1636 Neurology NP at bedside presently.    [MQ]  45 Dr. Doy Mince advises if no acute stroke on MRI would advise discharge. Advises to start on oral seizure meds. Initiate depakote    [MQ]    Clinical Course User Index [MQ] Delman Kitten, MD   Discussed with Dr. Doy Mince, she will have neurology provide consultation for further disposition and treatment planning.  ----------------------------------------- 8:36 PM on 03/07/2018 -----------------------------------------  Case discussed with Dr. Clayborn Heron (spelling) on call at [facility] for Dr. Doy Mince.  Advises salicylate therapy and admission for further stroke work-up per neurology service who is already seen and evaluated the patient.  Patient resting comfortably and his pastor stroke screen.  Patient agreeable with plan for admission. ____________________________________________   FINAL CLINICAL IMPRESSION(S) / ED DIAGNOSES  Final diagnoses:  Ischemic stroke of frontal lobe ([place])      NEW MEDICATIONS STARTED DURING THIS VISIT:  New Prescriptions    No medications on file     Note:  This document was prepared using Dragon voice recognition software and may include unintentional dictation errors.     Delman Kitten, MD 03/07/18 2037

## 2018-03-07 NOTE — Progress Notes (Signed)
Family Meeting Note  Advance Directive:yes  Today a meeting took place with the Patient and daughter, husband.   The following clinical team members were present during this meeting:MD  The following were discussed:Patient's diagnosis: Stroke, ESRDon HD, Htn, HLD , Patient's progosis: Unable to determine and Goals for treatment: Full Code  Additional follow-up to be provided: PMD, neurologist.  Time spent during discussion:20 minutes  Vaughan Basta, MD

## 2018-03-07 NOTE — ED Notes (Signed)
Patient transported to MRI 

## 2018-03-07 NOTE — ED Notes (Signed)
Pt watching tv   Waiting on mri.  Family with pt.  Pt alert. Speech clear

## 2018-03-07 NOTE — ED Notes (Signed)
Pt and family given warm blankets at this time .

## 2018-03-07 NOTE — ED Notes (Signed)
Spoke with pt about wait times and what to expect next. Advised pt that I am available for further questions if needed.  

## 2018-03-07 NOTE — H&P (Signed)
[facility] [place] NAME: Cynthia Dean    MR#:  [id number]  DATE OF BIRTH:  [birth year]  DATE OF ADMISSION:  03/07/2018  PRIMARY CARE PHYSICIAN: Lowella Bandy, MD   REQUESTING/REFERRING PHYSICIAN: Quale  CHIEF COMPLAINT:   Chief Complaint  Patient presents with  . Altered Mental Status    HISTORY OF PRESENT ILLNESS: Cynthia Dean  is a 67 y.o. female with a known history of COPD, coronary artery disease, diabetes, end-stage renal disease on hemodialysis, hyperlipidemia, hypertension, stroke-lives with family and was completely normal at her baseline today morning when she woke up.  She went to her dialysis as scheduled in around 10 during the dialysis they noted her having difficulty with her speech.  She finished her full dialysis session and advised to go to emergency room.  On arrival she denied any other symptoms other than mild speech problem, she was seen by a neurologist in the ER and they suggested to do an MRI on the brain to rule out seizures also. MRI of the brain confirmed acute stroke, ER physician spoke to on-call neurologist and they suggested to admit her for further stroke work-up.  PAST MEDICAL HISTORY:   Past Medical History:  Diagnosis Date  . COPD (chronic obstructive pulmonary disease) ([place])   . Coronary artery disease   . Diabetes mellitus without complication ([place])   . ESRD on dialysis ([place])   . Heart murmur   . Hyperlipidemia   . Hypertension   . Stroke [facility])     PAST SURGICAL HISTORY:  Past Surgical History:  Procedure Laterality Date  . A/V FISTULAGRAM Left 12/20/2016   Procedure: A/V Fistulagram;  Surgeon: Katha Cabal, MD;  Location: [place] [facility];  Service: Cardiovascular;  Laterality: Left;  . A/V SHUNT INTERVENTION N/A 12/20/2016   Procedure: A/V Shunt Intervention;  Surgeon: Katha Cabal, MD;  Location: [place] [facility];  Service: Cardiovascular;  Laterality: N/A;  . A/V  SHUNTOGRAM Left 09/11/2017   Procedure: A/V SHUNTOGRAM;  Surgeon: Algernon Huxley, MD;  Location: [place] [facility];  Service: Cardiovascular;  Laterality: Left;  . BREAST BIOPSY Bilateral 07/19/00   neg  . LOOP RECORDER INSERTION N/A 11/16/2017   Procedure: LOOP RECORDER INSERTION;  Surgeon: Deboraha Sprang, MD;  Location: [place] [facility];  Service: Cardiovascular;  Laterality: N/A;  . PERIPHERAL VASCULAR CATHETERIZATION Left 02/02/2015   Procedure: A/V Shuntogram/Fistulagram;  Surgeon: Algernon Huxley, MD;  Location: [place] [facility];  Service: Cardiovascular;  Laterality: Left;  . PERIPHERAL VASCULAR CATHETERIZATION Left 02/02/2015   Procedure: A/V Shunt Intervention;  Surgeon: Algernon Huxley, MD;  Location: [place] [facility];  Service: Cardiovascular;  Laterality: Left;  . PERIPHERAL VASCULAR CATHETERIZATION Left 03/09/2015   Procedure: A/V Shuntogram/Fistulagram;  Surgeon: Algernon Huxley, MD;  Location: [place] [facility];  Service: Cardiovascular;  Laterality: Left;  . PERIPHERAL VASCULAR CATHETERIZATION N/A 03/09/2015   Procedure: A/V Shunt Intervention;  Surgeon: Algernon Huxley, MD;  Location: [place] [facility];  Service: Cardiovascular;  Laterality: N/A;  . PERIPHERAL VASCULAR CATHETERIZATION Left 04/17/2015   Procedure: Upper Extremity Angiography;  Surgeon: Algernon Huxley, MD;  Location: [place] [facility];  Service: Cardiovascular;  Laterality: Left;  . PERIPHERAL VASCULAR CATHETERIZATION  04/17/2015   Procedure: Upper Extremity Intervention;  Surgeon: Algernon Huxley, MD;  Location: [place] [facility];  Service: Cardiovascular;;  . PERIPHERAL VASCULAR CATHETERIZATION N/A 08/10/2015   Procedure:  A/V Shuntogram/Fistulagram;  Surgeon: Algernon Huxley, MD;  Location: [place] [facility];  Service: Cardiovascular;  Laterality: N/A;  . PERIPHERAL VASCULAR CATHETERIZATION N/A 08/10/2015   Procedure: A/V Shunt Intervention;  Surgeon: Algernon Huxley, MD;  Location: [place] [facility];  Service:  Cardiovascular;  Laterality: N/A;  . PERIPHERAL VASCULAR CATHETERIZATION Left 04/11/2016   Procedure: A/V Shuntogram/Fistulagram;  Surgeon: Algernon Huxley, MD;  Location: [place] [facility];  Service: Cardiovascular;  Laterality: Left;  . TEE WITHOUT CARDIOVERSION N/A 11/16/2017   Procedure: TRANSESOPHAGEAL ECHOCARDIOGRAM (TEE);  Surgeon: Minna Merritts, MD;  Location: [facility];  Service: Cardiovascular;  Laterality: N/A;    SOCIAL HISTORY:  Social History   Tobacco Use  . Smoking status: Former Smoker    Packs/day: 0.00    Types: Cigarettes  . Smokeless tobacco: Never Used  Substance Use Topics  . Alcohol use: No    FAMILY HISTORY:  Family History  Problem Relation Age of Onset  . Breast cancer Father 77  . Stroke Mother   . Heart attack Mother   . Heart Problems Sister   . Breast cancer Cousin 76       1 st cousin. Maternal     DRUG ALLERGIES:  Allergies  Allergen Reactions  . Tape Itching    Skin Dermatitis/itching (tape adhesive)    REVIEW OF SYSTEMS:   CONSTITUTIONAL: No fever, fatigue or weakness.  EYES: No blurred or double vision.  EARS, NOSE, AND THROAT: No tinnitus or ear pain.  RESPIRATORY: No cough, shortness of breath, wheezing or hemoptysis.  CARDIOVASCULAR: No chest pain, orthopnea, edema.  GASTROINTESTINAL: No nausea, vomiting, diarrhea or abdominal pain.  GENITOURINARY: No dysuria, hematuria.  ENDOCRINE: No polyuria, nocturia,  HEMATOLOGY: No anemia, easy bruising or bleeding SKIN: No rash or lesion. MUSCULOSKELETAL: No joint pain or arthritis.   NEUROLOGIC: No tingling, numbness, weakness.  PSYCHIATRY: No anxiety or depression.   MEDICATIONS AT HOME:  Prior to Admission medications   Medication Sig Start Date End Date Taking? Authorizing Provider  acetaminophen (TYLENOL) 325 MG tablet Take 2 tablets (650 mg total) by mouth every 4 (four) hours as needed for mild pain (or temp > 37.5 C (99.5 F)). 11/17/17  Yes Gouru, Illene Silver, MD   albuterol-ipratropium (COMBIVENT) 18-103 MCG/ACT inhaler Inhale 2 puffs into the lungs 2 (two) times daily.   Yes [provider]  atorvastatin (LIPITOR) 80 MG tablet Take 80 mg by mouth every evening.    Yes [provider]  budesonide-formoterol (SYMBICORT) 160-4.5 MCG/ACT inhaler Inhale 2 puffs into the lungs 2 (two) times daily. 07/01/17  Yes Wieting, Richard, MD  calcium acetate (PHOSLO) 667 MG capsule Take 2,001 mg by mouth 3 (three) times daily with meals.    Yes [provider]  cinacalcet (SENSIPAR) 60 MG tablet Take 60 mg by mouth daily.   Yes [provider]  clopidogrel (PLAVIX) 75 MG tablet TAKE ONE TABLET BY MOUTH ONCE DAILY Patient taking differently: Take 75 mg by mouth daily.  06/14/16  Yes Stegmayer, Joelene Millin A, PA-C  gabapentin (NEURONTIN) 100 MG capsule Take 100 mg by mouth 3 (three) times daily.   Yes [provider]  insulin NPH-regular Human (NOVOLIN 70/30) (70-30) 100 UNIT/ML injection Inject 25 Units into the skin 2 (two) times daily with a meal. Except dialysis days. Do not take on Tuesdays, Thursdays, and Saturdays. 11/17/17  Yes Gouru, Aruna, MD  ipratropium-albuterol (DUONEB) 0.5-2.5 (3) MG/3ML SOLN Take 3 mLs by nebulization every 6 (six) hours  as needed (for shortness of breath/ wheezing).    Yes [provider]  lidocaine-prilocaine (EMLA) cream Apply 1 application topically as needed (for port access).    Yes [provider]  losartan (COZAAR) 100 MG tablet Take 100 mg by mouth daily. 09/12/17  Yes [provider]  metoprolol succinate (TOPROL-XL) 100 MG 24 hr tablet Take 100 mg by mouth every evening. Take with or immediately following a meal.    Yes [provider]  omeprazole (PRILOSEC) 20 MG capsule Take 20 mg by mouth 2 (two) times daily.    Yes [provider]  polyethylene glycol (MIRALAX / GLYCOLAX) packet Take 17 g by mouth daily as needed for moderate constipation. 11/17/17   Yes Gouru, Illene Silver, MD  torsemide (DEMADEX) 100 MG tablet Take 100 mg by mouth daily. 10/10/17  Yes [provider]  docusate sodium (COLACE) 100 MG capsule Take 1 capsule (100 mg total) by mouth 2 (two) times daily. Patient not taking: Reported on 03/07/2018 11/17/17   Nicholes Mango, MD  lacosamide (VIMPAT) 50 MG TABS tablet Take 1 tablet (50 mg total) by mouth 2 (two) times daily. Patient not taking: Reported on 03/07/2018 11/17/17   Nicholes Mango, MD      PHYSICAL EXAMINATION:   VITAL SIGNS: Blood pressure 117/61, pulse 64, temperature 98.7 F (37.1 C), resp. rate 19, height 5\' 6"  (1.676 m), weight 117.9 kg, SpO2 97 %.  GENERAL:  67 y.o.-year-old patient lying in the bed with no acute distress.  EYES: Pupils equal, round, reactive to light and accommodation. No scleral icterus. Extraocular muscles intact.  HEENT: Head atraumatic, normocephalic. Oropharynx and nasopharynx clear.  NECK:  Supple, no jugular venous distention. No thyroid enlargement, no tenderness.  LUNGS: Normal breath sounds bilaterally, no wheezing, rales,rhonchi or crepitation. No use of accessory muscles of respiration.  CARDIOVASCULAR: S1, S2 normal. No murmurs, rubs, or gallops.  ABDOMEN: Soft, nontender, nondistended. Bowel sounds present. No organomegaly or mass.  EXTREMITIES: No pedal edema, cyanosis, or clubbing.  NEUROLOGIC: Cranial nerves II through XII are intact. Muscle strength 5/5 in all extremities. Sensation intact. Gait not checked.  PSYCHIATRIC: The patient is alert and oriented x 3.  SKIN: No obvious rash, lesion, or ulcer.   LABORATORY PANEL:   CBC Recent Labs  Lab 03/07/18 1227  WBC 8.2  HGB 14.4  HCT 43.3  PLT 217  MCV 88.2  MCH 29.2  MCHC 33.1  RDW 16.0*  LYMPHSABS 1.9  MONOABS 0.7  EOSABS 0.1  BASOSABS 0.1   ------------------------------------------------------------------------------------------------------------------  Chemistries  Recent Labs  Lab 03/07/18 1354  03/07/18 1600  NA 137  --   K 3.9  --   CL 94*  --   CO2 29  --   GLUCOSE 147*  --   BUN 23  --   CREATININE 6.17*  --   CALCIUM 8.4*  --   MG  --  2.0  AST 19  --   ALT 17  --   ALKPHOS 130*  --   BILITOT 0.6  --    ------------------------------------------------------------------------------------------------------------------ estimated creatinine clearance is 11.6 mL/min (A) (by C-G formula based on SCr of 6.17 mg/dL (H)). ------------------------------------------------------------------------------------------------------------------ No results for input(s): TSH, T4TOTAL, T3FREE, THYROIDAB in the last 72 hours.  Invalid input(s): FREET3   Coagulation profile No results for input(s): INR, PROTIME in the last 168 hours. ------------------------------------------------------------------------------------------------------------------- No results for input(s): DDIMER in the last 72 hours. -------------------------------------------------------------------------------------------------------------------  Cardiac Enzymes Recent Labs  Lab 03/07/18 1354  TROPONINI <  0.03   ------------------------------------------------------------------------------------------------------------------ Invalid input(s): POCBNP  ---------------------------------------------------------------------------------------------------------------  Urinalysis    Component Value Date/Time   COLORURINE YELLOW (A) 11/15/2017 0801   APPEARANCEUR HAZY (A) 11/15/2017 0801   APPEARANCEUR Clear 06/14/2014 1510   LABSPEC 1.011 11/15/2017 0801   LABSPEC 1.006 06/14/2014 1510   PHURINE 7.0 11/15/2017 0801   GLUCOSEU NEGATIVE 11/15/2017 0801   GLUCOSEU >=500 06/14/2014 1510   HGBUR MODERATE (A) 11/15/2017 0801   BILIRUBINUR NEGATIVE 11/15/2017 0801   BILIRUBINUR Negative 06/14/2014 1510   KETONESUR NEGATIVE 11/15/2017 0801   PROTEINUR 100 (A) 11/15/2017 0801   NITRITE NEGATIVE 11/15/2017 0801    LEUKOCYTESUR MODERATE (A) 11/15/2017 0801   LEUKOCYTESUR Negative 06/14/2014 1510     RADIOLOGY: Ct Head Wo Contrast  Result Date: 03/07/2018 CLINICAL DATA:  Altered level of consciousness with slurred speech following dialysis EXAM: CT HEAD WITHOUT CONTRAST TECHNIQUE: Contiguous axial images were obtained from the base of the skull through the vertex without intravenous contrast. COMPARISON:  Head CT November 14, 2017; brain MRI November 15, 2017 FINDINGS: Brain: There is mild diffuse atrophy. There is no intracranial mass, hemorrhage, extra-axial fluid collection, or midline shift. There is evidence of a prior infarct in the mid to posterior right frontal lobe. There is evidence of a prior infarct at the superior, lateral left occipital region. There is patchy small vessel disease in the centra semiovale bilaterally. No acute infarct is evident. Vascular: There is no evident hyperdense vessel. There is calcification in each carotid siphon region. Skull: The bony calvarium appears intact. Sinuses/Orbits: There is mucosal thickening in several ethmoid air cells. Other visualized paranasal sinuses are clear. Visualized orbits appear symmetric bilaterally. Other: Visualized mastoid air cells are clear. IMPRESSION: Atrophy with stable periventricular small vessel disease. Prior right frontal and left occipital lobe infarcts, stable. No acute infarct evident. No mass or hemorrhage. Foci of arterial vascular calcification noted. There is mucosal thickening in several ethmoid air cells. Electronically Signed   By: Lowella Grip III M.D.   On: 03/07/2018 13:18   Mr Brain Wo Contrast  Result Date: 03/07/2018 CLINICAL DATA:  Initial evaluation for acute speech difficulty. EXAM: MRI HEAD WITHOUT CONTRAST TECHNIQUE: Multiplanar, multiecho pulse sequences of the brain and surrounding structures were obtained without intravenous contrast. COMPARISON:  Prior CT from earlier the same day. FINDINGS: Brain: Advanced  cerebral atrophy with moderate chronic small vessel ischemic disease, stable from previous. Remote bilateral MCA territory infarcts involving the anterior right frontal and left temporal occipital region again noted. Chronic lacunar infarcts involving the bilateral basal ganglia noted as well. Several scattered remote bilateral cerebellar infarcts. 12 mm curvilinear focus of restricted diffusion involving the cortical gray matter of the left frontal operculum, consistent with a small acute ischemic infarct (series 100, image 32). No associated hemorrhage or mass effect. No other evidence for acute or subacute ischemia. No mass lesion, midline shift or mass effect. Ventricular prominence related to global parenchymal volume loss without hydrocephalus. No extra-axial fluid collection. Vascular: Major intracranial vascular flow voids are maintained Skull and upper cervical spine: Craniocervical junction normal. Upper cervical spine within normal limits. Bone marrow signal intensity normal. No scalp soft tissue abnormality. Sinuses/Orbits: Globes and orbital soft tissues within normal limits. Paranasal sinuses and mastoid air cells are clear. Inner ear structures normal. Other: None. IMPRESSION: 1. 12 mm acute ischemic nonhemorrhagic cortical infarct involving the left frontal operculum. 2. Advanced atrophy with chronic small vessel ischemic disease with multiple remote chronic infarcts as above, otherwise stable from previous. Electronically  Signed   By: Jeannine Boga M.D.   On: 03/07/2018 19:40    EKG: Orders placed or performed during the hospital encounter of 03/07/18  . ED EKG  . ED EKG    IMPRESSION AND PLAN:  *Acute stroke Patient recently had echocardiogram done 4 months ago, so I will not repeat the test unless neurologist want to have it done. We will get a carotid Doppler study. MRI on brain is done we will get MRA on the brain. Check lipid panel and hemoglobin A1c. Physical therapy and  Occupational Therapy consult. Speech evaluation. Patient is already taking Plavix, we may have to add aspirin.  *Hyperlipidemia Continue atorvastatin, check lipid panel as mentioned above.  *Hypertension Continue home medications.  *End-stage renal disease on hemodialysis Finished a full session of dialysis today, if she stays beyond tomorrow, we may need to call nephrologist to help inpatient dialysis.   All the records are reviewed and case discussed with ED provider. Management plans discussed with the patient, family and they are in agreement.  CODE STATUS: Full. Code Status History    Date Active Date Inactive Code Status Order ID Comments User Context   11/15/2017 0017 11/17/2017 1809 Full Code [id number]  Lance Coon, MD ED   11/12/2017 2219 11/13/2017 1935 Full Code [id number]  Lance Coon, MD ED   04/14/2016 1330 04/14/2016 1700 Full Code [id number]  Bettey Costa, MD ED   08/08/2015 2257 08/09/2015 2054 Full Code [id number]  Hower, Aaron Mose, MD ED   04/17/2015 1334 04/17/2015 1836 Full Code [id number]  Dew, Erskine Squibb, MD Inpatient       TOTAL TIME TAKING CARE OF THIS PATIENT: 50 minutes.    Vaughan Basta M.D on 03/07/2018   Between 7am to 6pm - Pager - [phone]  After 6pm go to [email or web address] - password [email or web address] [facility]  Office  [phone]  CC: Primary care physician; Lowella Bandy, MD   Note: This dictation was prepared with Dragon dictation along with smaller phrase technology. Any transcriptional errors that result from this process are unintentional.

## 2018-03-08 ENCOUNTER — Inpatient Hospital Stay: Payer: Medicare HMO

## 2018-03-08 DIAGNOSIS — I639 Cerebral infarction, unspecified: Principal | ICD-10-CM

## 2018-03-08 LAB — LIPID PANEL
Cholesterol: 202 mg/dL — ABNORMAL HIGH (ref 0–200)
HDL: 46 mg/dL (ref >40)
LDL Cholesterol: 112 mg/dL — ABNORMAL HIGH (ref 0–99)
Total CHOL/HDL Ratio: 4.4 RATIO
Triglycerides: 222 mg/dL — ABNORMAL HIGH (ref <150)
VLDL: 44 mg/dL — ABNORMAL HIGH (ref 0–40)

## 2018-03-08 LAB — MRSA PCR SCREENING: MRSA BY PCR: NEGATIVE

## 2018-03-08 LAB — GLUCOSE, CAPILLARY: GLUCOSE-CAPILLARY: 202 mg/dL — AB (ref 70–99)

## 2018-03-08 LAB — HEMOGLOBIN A1C
Hgb A1c MFr Bld: 7.2 % — ABNORMAL HIGH (ref 4.8–5.6)
Mean Plasma Glucose: 159.94 mg/dL

## 2018-03-08 MED ORDER — INSULIN ASPART PROT & ASPART (70-30 MIX) 100 UNIT/ML ~~LOC~~ SUSP
20.0000 [IU] | Freq: Two times a day (BID) | SUBCUTANEOUS | Status: DC
  Filled 2018-03-08: qty 10

## 2018-03-08 MED ORDER — CLOPIDOGREL BISULFATE 75 MG PO TABS
75.0000 mg | ORAL_TABLET | Freq: Every day | ORAL | Status: DC
  Administered 2018-03-08 – 2018-03-09 (×2): 75 mg via ORAL
  Filled 2018-03-08 (×2): qty 1

## 2018-03-08 MED ORDER — ACETAMINOPHEN 325 MG PO TABS: 650.0000 mg | ORAL_TABLET | ORAL | Status: DC | PRN

## 2018-03-08 MED ORDER — TORSEMIDE 20 MG PO TABS
100.0000 mg | ORAL_TABLET | Freq: Every day | ORAL | Status: DC
  Administered 2018-03-08 – 2018-03-09 (×2): 100 mg via ORAL
  Filled 2018-03-08: qty 5

## 2018-03-08 MED ORDER — POLYETHYLENE GLYCOL 3350 17 G PO PACK: 17.0000 g | PACK | Freq: Every day | ORAL | Status: DC | PRN

## 2018-03-08 MED ORDER — MOMETASONE FURO-FORMOTEROL FUM 200-5 MCG/ACT IN AERO
2.0000 | INHALATION_SPRAY | Freq: Two times a day (BID) | RESPIRATORY_TRACT | Status: DC
  Administered 2018-03-08 – 2018-03-09 (×3): 2 via RESPIRATORY_TRACT
  Filled 2018-03-08: qty 8.8

## 2018-03-08 MED ORDER — ATORVASTATIN CALCIUM 20 MG PO TABS
80.0000 mg | ORAL_TABLET | Freq: Every evening | ORAL | Status: DC
  Administered 2018-03-08: 80 mg via ORAL
  Filled 2018-03-08: qty 4

## 2018-03-08 MED ORDER — HEPARIN SODIUM (PORCINE) 5000 UNIT/ML IJ SOLN
5000.0000 [IU] | Freq: Three times a day (TID) | INTRAMUSCULAR | Status: DC
  Administered 2018-03-08 – 2018-03-09 (×4): 5000 [IU] via SUBCUTANEOUS
  Filled 2018-03-08 (×4): qty 1

## 2018-03-08 MED ORDER — ACETAMINOPHEN 650 MG RE SUPP: 650.0000 mg | RECTAL | Status: DC | PRN

## 2018-03-08 MED ORDER — PANTOPRAZOLE SODIUM 40 MG PO TBEC
40.0000 mg | DELAYED_RELEASE_TABLET | Freq: Every day | ORAL | Status: DC
  Administered 2018-03-08 – 2018-03-09 (×2): 40 mg via ORAL
  Filled 2018-03-08 (×2): qty 1

## 2018-03-08 MED ORDER — INSULIN ASPART PROT & ASPART (70-30 MIX) 100 UNIT/ML ~~LOC~~ SUSP
10.0000 [IU] | Freq: Two times a day (BID) | SUBCUTANEOUS | Status: DC
  Administered 2018-03-09: 10 [IU] via SUBCUTANEOUS
  Filled 2018-03-08 (×2): qty 10

## 2018-03-08 MED ORDER — CINACALCET HCL 30 MG PO TABS
60.0000 mg | ORAL_TABLET | Freq: Every day | ORAL | Status: DC
  Administered 2018-03-08 – 2018-03-09 (×2): 60 mg via ORAL
  Filled 2018-03-08 (×2): qty 2

## 2018-03-08 MED ORDER — IPRATROPIUM-ALBUTEROL 0.5-2.5 (3) MG/3ML IN SOLN
3.0000 mL | Freq: Four times a day (QID) | RESPIRATORY_TRACT | Status: DC | PRN
  Administered 2018-03-08: 16:00:00 3 mL via RESPIRATORY_TRACT
  Filled 2018-03-08: qty 3

## 2018-03-08 MED ORDER — LOSARTAN POTASSIUM 50 MG PO TABS
100.0000 mg | ORAL_TABLET | Freq: Every day | ORAL | Status: DC
  Administered 2018-03-08 – 2018-03-09 (×2): 100 mg via ORAL
  Filled 2018-03-08 (×2): qty 2

## 2018-03-08 MED ORDER — METOPROLOL SUCCINATE ER 50 MG PO TB24
100.0000 mg | ORAL_TABLET | Freq: Every evening | ORAL | Status: DC
  Administered 2018-03-08: 20:00:00 100 mg via ORAL
  Filled 2018-03-08: qty 2

## 2018-03-08 MED ORDER — CALCIUM ACETATE (PHOS BINDER) 667 MG PO CAPS
2001.0000 mg | ORAL_CAPSULE | Freq: Three times a day (TID) | ORAL | Status: DC
  Administered 2018-03-08 – 2018-03-09 (×3): 2001 mg via ORAL
  Filled 2018-03-08 (×6): qty 3

## 2018-03-08 MED ORDER — ACETAMINOPHEN 160 MG/5ML PO SOLN
650.0000 mg | ORAL | Status: DC | PRN
  Filled 2018-03-08: qty 20.3

## 2018-03-08 MED ORDER — STROKE: EARLY STAGES OF RECOVERY BOOK
Freq: Once | Status: AC
  Administered 2018-03-08: 01:00:00

## 2018-03-08 MED ORDER — INSULIN ASPART 100 UNIT/ML ~~LOC~~ SOLN
0.0000 [IU] | Freq: Three times a day (TID) | SUBCUTANEOUS | Status: DC
  Administered 2018-03-09: 2 [IU] via SUBCUTANEOUS
  Filled 2018-03-08 (×2): qty 1

## 2018-03-08 MED ORDER — INSULIN ASPART 100 UNIT/ML ~~LOC~~ SOLN
0.0000 [IU] | Freq: Every day | SUBCUTANEOUS | Status: DC
  Administered 2018-03-08: 22:00:00 2 [IU] via SUBCUTANEOUS
  Filled 2018-03-08: qty 1

## 2018-03-08 MED ORDER — GABAPENTIN 100 MG PO CAPS
100.0000 mg | ORAL_CAPSULE | Freq: Three times a day (TID) | ORAL | Status: DC
  Administered 2018-03-08 – 2018-03-09 (×4): 100 mg via ORAL
  Filled 2018-03-08 (×4): qty 1

## 2018-03-08 MED ORDER — SENNOSIDES-DOCUSATE SODIUM 8.6-50 MG PO TABS: 1.0000 | ORAL_TABLET | Freq: Every evening | ORAL | Status: DC | PRN

## 2018-03-08 NOTE — Evaluation (Signed)
Occupational Therapy Evaluation Patient Details Name: Cynthia Dean MRN: [id number] DOB: Apr 16, 1951 Today's Date: 03/08/2018    History of Present Illness Pt is 67 y/o F with PMH COPD, CAD, DM, ESRD on HD with L fistula, HTN. Presented to ED 8/14 with difficulty speaking after dialysis appointment. Has presented with similar symptoms in the past. Of note: questionable seizure d/o.  MRA of brain showed acute ischemic non-hemorrhagic coritcal infarct.      Clinical Impression  Pt seen for OT evaluation this date. Prior to hospital admission, pt was I with ADLs with assist from her fiance for IADLs which pt reports is due to his preference. Pt was not driving, takes bus to HD appointments, fiance gets groceries. Other family members get groceries intermittently as her fiance works during the day.  Pt lives in single story home with 2-3 steps to enter. She has 2WW but was not using this for mobility.  Currently pt demonstrates slight impairments with word finding for expressive communication as well as slightly decreased activity tolerance and balance requiring supervision for functional transfers and mobility. Seated, pt appropriately performs ADLs including LB dressing, supervision provided for standing ADLs.  Pt does not appear to require OT f/u at this time based on performance on assessment and understanding of safety recommendations.     Follow Up Recommendations  No OT follow up    Equipment Recommendations  None recommended by OT    Recommendations for Other Services Speech consult     Precautions / Restrictions Precautions Precautions: Fall Restrictions Weight Bearing Restrictions: No      Mobility Bed Mobility Overal bed mobility: Modified Independent             General bed mobility comments: HOB 20 degrees, increased time allotted   Transfers Overall transfer level: Needs assistance   Transfers: Sit to/from Stand(from EOB) Sit to Stand: Supervision               Balance Overall balance assessment: Needs assistance Sitting-balance support: Feet supported Sitting balance-Leahy Scale: Normal       Standing balance-Leahy Scale: Good                             ADL either performed or assessed with clinical judgement   ADL Overall ADL's : Needs assistance/impaired             Lower Body Bathing: Supervison/ safety Lower Body Bathing Details (indicate cue type and reason): in sitting     Lower Body Dressing: Supervision/safety Lower Body Dressing Details (indicate cue type and reason): in sitting             Functional mobility during ADLs: Supervision/safety       Vision Baseline Vision/History: No visual deficits Patient Visual Report: No change from baseline Vision Assessment?: Yes Eye Alignment: Within Functional Limits Ocular Range of Motion: Within Functional Limits Alignment/Gaze Preference: Within Defined Limits Tracking/Visual Pursuits: Able to track stimulus in all quads without difficulty Saccades: Within functional limits Convergence: Within functional limits Visual Fields: No apparent deficits     Perception     Praxis      Pertinent Vitals/Pain Pain Assessment: No/denies pain     Hand Dominance Right   Extremity/Trunk Assessment Upper Extremity Assessment Upper Extremity Assessment: RUE deficits/detail;LUE deficits/detail RUE Deficits / Details: 4+/5 shld flexion/abd and elbow flex/ext LUE Deficits / Details: 4+/5 shld flexion/abd and elbow flex/ext   Lower Extremity Assessment Lower Extremity Assessment:  Defer to PT evaluation       Communication Communication Communication: Expressive difficulties(slow word-finding)   Cognition Arousal/Alertness: Awake/alert Behavior During Therapy: WFL for tasks assessed/performed Overall Cognitive Status: Within Functional Limits for tasks assessed                                 General Comments: Pt required  increased time to appropriately answer orientation questions, increased time for processing and intermittently, min verbal cues.    General Comments       Exercises Other Exercises Other Exercises: Pt educated on falls prevention, scanning environment for safety hazards, energy conservation techniques including sitting for hygiene/grooming tasks. Pt verbalized understanding of safety recommendations.   Shoulder Instructions      Home Living Family/patient expects to be discharged to:: Private residence Living Arrangements: Spouse/significant other Available Help at Discharge: Family;Available PRN/intermittently Type of Home: House Home Access: Stairs to enter [employer] of Steps: 2 Entrance Stairs-Rails: Right;Left;Can reach both Home Layout: One level     Bathroom Shower/Tub: Teacher, early years/pre: Standard     Home Equipment: Environmental consultant - 2 wheels;Shower seat   Additional Comments: does not use equipment at home      Prior Functioning/Environment          Comments: Pt reports that her fiance performs most IADLs including cooking, cleaning and laundry, pt does not drive, takes bus to HD appointments.         OT Problem List: Decreased activity tolerance;Decreased coordination      OT Treatment/Interventions:      OT Goals(Current goals can be found in the care plan section) Acute Rehab OT Goals Patient Stated Goal: to go home OT Goal Formulation: All assessment and education complete, DC therapy  OT Frequency:     Barriers to D/C:            Co-evaluation              AM-PAC PT "6 Clicks" Daily Activity     Outcome Measure Help from another person eating meals?: None Help from another person taking care of personal grooming?: None Help from another person toileting, which includes using toliet, bedpan, or urinal?: A Little Help from another person bathing (including washing, rinsing, drying)?: A Little Help from another person  to put on and taking off regular upper body clothing?: None Help from another person to put on and taking off regular lower body clothing?: A Little 6 Click Score: 21   End of Session Equipment Utilized During Treatment: Gait belt Nurse Communication: Mobility status  Activity Tolerance: Patient tolerated treatment well Patient left: (EOB with alarm off with PT presenting for assessment.)  OT Visit Diagnosis: Unsteadiness on feet (R26.81)                Time: 6720-9470 OT Time Calculation (min): 23 min Charges:  OT General Charges $OT Visit: 1 Visit OT Evaluation $OT Eval Moderate Complexity: 1 Mod OT Treatments $Self Care/Home Management : 8-22 mins  Gerrianne Scale, MS, OTR/L ascom [phone] or [phone] 03/08/18, 9:02 AM

## 2018-03-08 NOTE — Progress Notes (Signed)
Referring Physician: Vaughan Basta, MD    Chief Complaint: Speech difficulty  HPI: Cynthia Dean is an 67 y.o. female presented to the ED on 03/07/2018 with speech difficulty. She report that she has hx of speech difficulty with prior strokes but that this had improved until yesterday when she noticed that she could not get her words out. She denies other associated symptoms of numbness, weakness, facial droop, dizziness, vision disturbances or difficulty walking. She had stroke work up including Initial CT head did not show any acute abnormality. Follow up MRI  brain showed acute 12 mm acute ischemic nonhemorrhagic cortical stroke involving the left frontal operculum. She has previous hx of abnormal EEG and was started on Vimpat, she however report that she has not been on Vimpat due to cost. She was loaded with Depakote 250 mg IV in the ED. She is reporting diarrhea today. Patient state that speech is improving but she still has some difficulty.  Date last known well: Date: 03/08/2018 Time last known well: Time: 06:00 tPA Given: [id number] time window  Past Medical History:  Diagnosis Date  . COPD (chronic obstructive pulmonary disease) ([place])   . Coronary artery disease   . Diabetes mellitus without complication ([place])   . ESRD on dialysis ([place])   . Heart murmur   . Hyperlipidemia   . Hypertension   . Stroke [facility])     Past Surgical History:  Procedure Laterality Date  . A/V FISTULAGRAM Left 12/20/2016   Procedure: A/V Fistulagram;  Surgeon: Katha Cabal, MD;  Location: [place] [facility];  Service: Cardiovascular;  Laterality: Left;  . A/V SHUNT INTERVENTION N/A 12/20/2016   Procedure: A/V Shunt Intervention;  Surgeon: Katha Cabal, MD;  Location: [place] [facility];  Service: Cardiovascular;  Laterality: N/A;  . A/V SHUNTOGRAM Left 09/11/2017   Procedure: A/V SHUNTOGRAM;  Surgeon: Algernon Huxley, MD;  Location: [place] [facility];  Service: Cardiovascular;   Laterality: Left;  . BREAST BIOPSY Bilateral 07/19/00   neg  . LOOP RECORDER INSERTION N/A 11/16/2017   Procedure: LOOP RECORDER INSERTION;  Surgeon: Deboraha Sprang, MD;  Location: [place] [facility];  Service: Cardiovascular;  Laterality: N/A;  . PERIPHERAL VASCULAR CATHETERIZATION Left 02/02/2015   Procedure: A/V Shuntogram/Fistulagram;  Surgeon: Algernon Huxley, MD;  Location: [place] [facility];  Service: Cardiovascular;  Laterality: Left;  . PERIPHERAL VASCULAR CATHETERIZATION Left 02/02/2015   Procedure: A/V Shunt Intervention;  Surgeon: Algernon Huxley, MD;  Location: [place] [facility];  Service: Cardiovascular;  Laterality: Left;  . PERIPHERAL VASCULAR CATHETERIZATION Left 03/09/2015   Procedure: A/V Shuntogram/Fistulagram;  Surgeon: Algernon Huxley, MD;  Location: [place] [facility];  Service: Cardiovascular;  Laterality: Left;  . PERIPHERAL VASCULAR CATHETERIZATION N/A 03/09/2015   Procedure: A/V Shunt Intervention;  Surgeon: Algernon Huxley, MD;  Location: [place] [facility];  Service: Cardiovascular;  Laterality: N/A;  . PERIPHERAL VASCULAR CATHETERIZATION Left 04/17/2015   Procedure: Upper Extremity Angiography;  Surgeon: Algernon Huxley, MD;  Location: [place] [facility];  Service: Cardiovascular;  Laterality: Left;  . PERIPHERAL VASCULAR CATHETERIZATION  04/17/2015   Procedure: Upper Extremity Intervention;  Surgeon: Algernon Huxley, MD;  Location: [place] [facility];  Service: Cardiovascular;;  . PERIPHERAL VASCULAR CATHETERIZATION N/A 08/10/2015   Procedure: A/V Shuntogram/Fistulagram;  Surgeon: Algernon Huxley, MD;  Location: [place] [facility];  Service: Cardiovascular;  Laterality: N/A;  . PERIPHERAL VASCULAR CATHETERIZATION N/A 08/10/2015   Procedure: A/V Shunt  Intervention;  Surgeon: Algernon Huxley, MD;  Location: [place] [facility];  Service: Cardiovascular;  Laterality: N/A;  . PERIPHERAL VASCULAR CATHETERIZATION Left 04/11/2016   Procedure: A/V Shuntogram/Fistulagram;  Surgeon: Algernon Huxley, MD;  Location: [place] [facility];  Service: Cardiovascular;  Laterality: Left;  . TEE WITHOUT CARDIOVERSION N/A 11/16/2017   Procedure: TRANSESOPHAGEAL ECHOCARDIOGRAM (TEE);  Surgeon: Minna Merritts, MD;  Location: [facility];  Service: Cardiovascular;  Laterality: N/A;    Family History  Problem Relation Age of Onset  . Breast cancer Father 4  . Stroke Mother   . Heart attack Mother   . Heart Problems Sister   . Breast cancer Cousin 2       1 st cousin. Maternal    Social History:  reports that she has quit smoking. Her smoking use included cigarettes. She smoked 0.00 packs per day. She has never used smokeless tobacco. She reports that she does not drink alcohol or use drugs.  Allergies:  Allergies  Allergen Reactions  . Tape Itching    Skin Dermatitis/itching (tape adhesive)    Medications:  I have reviewed the patient's current medications. Scheduled: . atorvastatin  80 mg Oral QPM  . calcium acetate  2,001 mg Oral TID WC  . cinacalcet  60 mg Oral Q breakfast  . clopidogrel  75 mg Oral Daily  . gabapentin  100 mg Oral TID  . heparin  5,000 Units Subcutaneous Q8H  . insulin aspart  0-5 Units Subcutaneous QHS  . insulin aspart  0-9 Units Subcutaneous TID WC  . insulin aspart protamine- aspart  10 Units Subcutaneous BID WC  . losartan  100 mg Oral Daily  . metoprolol succinate  100 mg Oral QPM  . mometasone-formoterol  2 puff Inhalation BID  . pantoprazole  40 mg Oral Daily  . torsemide  100 mg Oral Daily    ROS: History obtained from the patient   General ROS: negative for - chills, fatigue, fever, night sweats, weight gain or weight loss Psychological ROS: negative for - behavioral disorder, hallucinations, memory difficulties, mood swings or suicidal ideation Ophthalmic ROS: negative for - blurry vision, double vision, eye pain or loss of vision ENT ROS: negative for - epistaxis, nasal discharge, oral lesions, sore throat, tinnitus or vertigo Allergy and  Immunology ROS: negative for - hives or itchy/watery eyes Hematological and Lymphatic ROS: negative for - bleeding problems, bruising or swollen lymph nodes Endocrine ROS: negative for - galactorrhea, hair pattern changes, polydipsia/polyuria or temperature intolerance Respiratory ROS: negative for - cough, hemoptysis,  Positive for shortness of breath and wheezing Cardiovascular ROS: negative for - chest pain, dyspnea on exertion, edema or irregular heartbeat Gastrointestinal ROS: negative for - abdominal pain hematemesis, nausea/vomiting or stool incontinence. Positive for , diarrhea,  Genito-Urinary ROS: negative for - dysuria, hematuria, incontinence or urinary frequency/urgency Musculoskeletal ROS: negative for - joint swelling or muscular weakness Neurological ROS: as noted in HPI Dermatological ROS: negative for rash and skin lesion changes   Physical Examination: Blood pressure 140/60, pulse 63, temperature 98 F (36.7 C), temperature source Oral, resp. rate 18, height 5\' 6"  (1.676 m), weight 120.9 kg, SpO2 95 %.  General Exam Patient looks appropriate of age, well built, nourished and appropriately groomed.  Cardiovascular Exam: S1, S2 heart sounds present Carotid exam revealed no bruit Lung exam was clear to auscultation ? Neurological Exam  Mental Status: Alert, Oriented to time, place, person and situation Attention span and concentration seemed appropriate Memory seemed  OK. Intact naming, repetition, comprehension.  Followed 2 step commands - Mild dysarthria, moderate expressive aphasia  Fund of knowledge seemed appropriate for age and health status.  Cranial Nerves: I. Olfactory not examined II: Visual fields were full. Pupils were equal, round and reactive to light and accommodation III,IV, VI: ptosis not present, extra-ocular motions intact bilaterally V,VII: smile symmetric, facial light touch sensation normal bilaterally VIII: Finger rub was heard symmetric in  both ears IX, X: Palate and uvular movements are normal and oral sensations are OK, gag reflex deffered XI: Neck muscle strength and shoulder shrug is normal XII: Tongue deviated to the right  Motor Exam: Tone is normal in all extremities Muscle strength in all extremities is 5/5. Right arm drift with pronation No abnormal movements, fasciculations or atrophy seen  Deep Tendon Reflexes: Right Biceps is 2+, Left Biceps is 2+ Right Triceps is 2+, Left Triceps is 2+ Right Brachioradialis is 2+, Left Brachioradialis is 2+ Right Knee Jerk is 2+, Left Knee Jerk is 2+ Right Ankle Jerk is 2+, Left Ankle Jerk is 2+ Right Toes are down going, Left Toes are down going  Sensory Exam: Sensations were intact to light touch in all extremities Vibration and proprioception are also intact  Co-ordination: Finger to nose is normal  Gait: Gait and station are normal when examined in small exam room Romberg's test is negative  Data Reviewed  Laboratory Studies:  Basic Metabolic Panel: Recent Labs  Lab 03/07/18 1354 03/07/18 1600  NA 137  --   K 3.9  --   CL 94*  --   CO2 29  --   GLUCOSE 147*  --   BUN 23  --   CREATININE 6.17*  --   CALCIUM 8.4*  --   MG  --  2.0    Liver Function Tests: Recent Labs  Lab 03/07/18 1354  AST 19  ALT 17  ALKPHOS 130*  BILITOT 0.6  PROT 8.5*  ALBUMIN 4.3   No results for input(s): LIPASE, AMYLASE in the last 168 hours. No results for input(s): AMMONIA in the last 168 hours.  CBC: Recent Labs  Lab 03/07/18 1227  WBC 8.2  NEUTROABS 5.4  HGB 14.4  HCT 43.3  MCV 88.2  PLT 217    Cardiac Enzymes: Recent Labs  Lab 03/07/18 1354  TROPONINI <0.03    BNP: Invalid input(s): POCBNP  CBG: Recent Labs  Lab 03/07/18 1155  GLUCAP 149*    Microbiology: Results for orders placed or performed during the hospital encounter of 03/07/18  MRSA PCR Screening     Status: None   Collection Time: 03/07/18 11:37 PM  Result Value Ref Range  Status   MRSA by PCR NEGATIVE NEGATIVE Final    Comment:        The GeneXpert MRSA Assay (FDA approved for NASAL specimens only), is one component of a comprehensive MRSA colonization surveillance program. It is not intended to diagnose MRSA infection nor to guide or monitor treatment for MRSA infections. Performed at [facility], [address] [place], [place] [address]     Coagulation Studies: No results for input(s): LABPROT, INR in the last 72 hours.  Urinalysis: No results for input(s): COLORURINE, LABSPEC, PHURINE, GLUCOSEU, HGBUR, BILIRUBINUR, KETONESUR, PROTEINUR, UROBILINOGEN, NITRITE, LEUKOCYTESUR in the last 168 hours.  Invalid input(s): APPERANCEUR  Lipid Panel:    Component Value Date/Time   CHOL 202 (H) 03/07/2018 1227   CHOL 78 12/25/2013 0413   TRIG 222 (H) 03/07/2018 1227  TRIG 53 12/25/2013 0413   HDL 46 03/07/2018 1227   HDL 31 (L) 12/25/2013 0413   CHOLHDL 4.4 03/07/2018 1227   VLDL 44 (H) 03/07/2018 1227   VLDL 11 12/25/2013 0413   LDLCALC 112 (H) 03/07/2018 1227   LDLCALC 36 12/25/2013 0413    HgbA1C:  Lab Results  Component Value Date   HGBA1C 7.2 (H) 03/07/2018    Urine Drug Screen:      Component Value Date/Time   LABOPIA NONE DETECTED 11/14/2017 2156   COCAINSCRNUR NONE DETECTED 11/14/2017 2156   LABBENZ NONE DETECTED 11/14/2017 2156   AMPHETMU NONE DETECTED 11/14/2017 2156   THCU POSITIVE (A) 11/14/2017 2156   LABBARB NONE DETECTED 11/14/2017 2156    Alcohol Level: No results for input(s): ETH in the last 168 hours.  Other results:  EKG  Reviewed enterotomy at 1230 Heart rate 70 QRS 90 QTc 490 Normal sinus rhythm with moderate prolongation of QT  Imaging: Dg Chest 2 View  Result Date: 03/08/2018 CLINICAL DATA:  Stroke-like symptoms EXAM: CHEST - 2 VIEW COMPARISON:  04/14/2016 FINDINGS: Cardiac shadow is stable. Loop recorder is noted in satisfactory position. The lungs are well aerated bilaterally. No  focal infiltrate or sizable effusion is seen. No bony abnormality is noted. IMPRESSION: No active cardiopulmonary disease. Electronically Signed   By: Inez Catalina M.D.   On: 03/08/2018 01:33   Ct Head Wo Contrast  Result Date: 03/07/2018 CLINICAL DATA:  Altered level of consciousness with slurred speech following dialysis EXAM: CT HEAD WITHOUT CONTRAST TECHNIQUE: Contiguous axial images were obtained from the base of the skull through the vertex without intravenous contrast. COMPARISON:  Head CT November 14, 2017; brain MRI November 15, 2017 FINDINGS: Brain: There is mild diffuse atrophy. There is no intracranial mass, hemorrhage, extra-axial fluid collection, or midline shift. There is evidence of a prior infarct in the mid to posterior right frontal lobe. There is evidence of a prior infarct at the superior, lateral left occipital region. There is patchy small vessel disease in the centra semiovale bilaterally. No acute infarct is evident. Vascular: There is no evident hyperdense vessel. There is calcification in each carotid siphon region. Skull: The bony calvarium appears intact. Sinuses/Orbits: There is mucosal thickening in several ethmoid air cells. Other visualized paranasal sinuses are clear. Visualized orbits appear symmetric bilaterally. Other: Visualized mastoid air cells are clear. IMPRESSION: Atrophy with stable periventricular small vessel disease. Prior right frontal and left occipital lobe infarcts, stable. No acute infarct evident. No mass or hemorrhage. Foci of arterial vascular calcification noted. There is mucosal thickening in several ethmoid air cells. Electronically Signed   By: Lowella Grip III M.D.   On: 03/07/2018 13:18   Mr Brain Wo Contrast  Result Date: 03/07/2018 CLINICAL DATA:  Initial evaluation for acute speech difficulty. EXAM: MRI HEAD WITHOUT CONTRAST TECHNIQUE: Multiplanar, multiecho pulse sequences of the brain and surrounding structures were obtained without  intravenous contrast. COMPARISON:  Prior CT from earlier the same day. FINDINGS: Brain: Advanced cerebral atrophy with moderate chronic small vessel ischemic disease, stable from previous. Remote bilateral MCA territory infarcts involving the anterior right frontal and left temporal occipital region again noted. Chronic lacunar infarcts involving the bilateral basal ganglia noted as well. Several scattered remote bilateral cerebellar infarcts. 12 mm curvilinear focus of restricted diffusion involving the cortical gray matter of the left frontal operculum, consistent with a small acute ischemic infarct (series 100, image 32). No associated hemorrhage or mass effect. No other evidence for acute or  subacute ischemia. No mass lesion, midline shift or mass effect. Ventricular prominence related to global parenchymal volume loss without hydrocephalus. No extra-axial fluid collection. Vascular: Major intracranial vascular flow voids are maintained Skull and upper cervical spine: Craniocervical junction normal. Upper cervical spine within normal limits. Bone marrow signal intensity normal. No scalp soft tissue abnormality. Sinuses/Orbits: Globes and orbital soft tissues within normal limits. Paranasal sinuses and mastoid air cells are clear. Inner ear structures normal. Other: None. IMPRESSION: 1. 12 mm acute ischemic nonhemorrhagic cortical infarct involving the left frontal operculum. 2. Advanced atrophy with chronic small vessel ischemic disease with multiple remote chronic infarcts as above, otherwise stable from previous. Electronically Signed   By: Jeannine Boga M.D.   On: 03/07/2018 19:40   US Carotid Bilateral (at [facility] And [facility] Only)  Result Date: 03/08/2018 CLINICAL DATA:  Stroke.  Hypertension, hyperlipidemia, diabetes. EXAM: BILATERAL CAROTID DUPLEX ULTRASOUND TECHNIQUE: Pearline Cables scale imaging, color Doppler and duplex ultrasound were performed of bilateral carotid and vertebral arteries in the neck.  COMPARISON:  11/13/2017 and previous FINDINGS: Criteria: Quantification of carotid stenosis is based on velocity parameters that correlate the residual internal carotid diameter with NASCET-based stenosis levels, using the diameter of the distal internal carotid lumen as the denominator for stenosis measurement. The following velocity measurements were obtained: RIGHT ICA: 83/13 cm/sec CCA: 48/5 cm/sec SYSTOLIC ICA/CCA RATIO:  1.2 ECA: 76 cm/sec LEFT ICA: 48/7 cm/sec CCA: 46/27 cm/sec SYSTOLIC ICA/CCA RATIO:  0.5 ECA: 129 cm/sec RIGHT CAROTID ARTERY: Scattered partially calcified plaque in the common carotid artery. Partially calcified plaque in the bulb resulting in at least mild stenosis. Normal waveforms and color Doppler signal throughout. Mild tortuosity. RIGHT VERTEBRAL ARTERY:  Normal flow direction and waveform. LEFT CAROTID ARTERY: Eccentric partially calcified plaque in the bulb and proximal ICA, without high-grade stenosis. Normal waveforms and color Doppler signal. In complex mixed solid/cystic left thyroid lesion measuring up to 2.5 cm, seen on studies dating back to 04/14/2016. LEFT VERTEBRAL ARTERY:  Normal flow direction and waveform. IMPRESSION: 1. Bilateral carotid plaque resulting in less than 50% diameter stenosis. 2.  Antegrade bilateral vertebral arterial flow. 3. Mixed solid/cystic left thyroid nodule as previously described. Electronically Signed   By: Lucrezia Europe M.D.   On: 03/08/2018 11:08   Mr Jodene Nam Head/brain OJ Cm  Result Date: 03/08/2018 CLINICAL DATA:  Follow up stroke. History of end-stage renal disease on dialysis, hypertension, hyperlipidemia and stroke. EXAM: MRA HEAD WITHOUT CONTRAST TECHNIQUE: Angiographic images of the Circle of Willis were obtained using MRA technique without intravenous contrast. COMPARISON:  MRI head March 07, 2018 and MRA head November 12, 2017 FINDINGS: Mild motion degraded examination. ANTERIOR CIRCULATION: Normal flow related enhancement of the included  cervical, petrous, cavernous and supraclinoid internal carotid arteries. Patent anterior communicating artery. Patent anterior and middle cerebral arteries, mild luminal irregularity. No large vessel occlusion, flow limiting stenosis, aneurysm. POSTERIOR CIRCULATION: Codominant vertebral arteries. Vertebrobasilar arteries are patent, with normal flow related enhancement of the main branch vessels. Patent posterior cerebral arteries, mild luminal irregularity. Robust bilateral posterior communicating arteries present. No large vessel occlusion, flow limiting stenosis,  aneurysm. ANATOMIC VARIANTS: None. Source images and MIP images were reviewed. IMPRESSION: 1. Mild motion degraded examination. 2. No emergent large vessel occlusion or flow-limiting stenosis. 3. Mild atherosclerosis versus motion artifact. Electronically Signed   By: Elon Alas M.D.   On: 03/08/2018 01:32    Patient seen and examined.  Clinical course and management discussed.  Necessary edits performed.  I agree with  the above.  Assessment and plan of care developed and discussed below.    Assessment: 67 y.o. female  with 12 mm acute ischemic nonhemorrhagic cortical infarct involving the left frontal operculum likely due to small vessel disease causing speech/language difficulty and right arm weakness. MRI/MRA personally reviewed which showed acute infarct as described above with remote chronic infarcts involving bilateral MCA territory infarcts involving the anterior right frontal and left temporal occipital region, and multiple subcortical infarcts. US carotids did not show significant stenosis. She has had prior Echo with TEE on 4/19 which did not show  any significant abnormality, no evidence of PFO, significant valve disease or left atrial abnormalities.  Moderate pulmonary hypertension noted.  She has an implanted LINQ device for evaluation of occult atrial fibrillation. HgbA1c 7.2, LDL 112. She report that she was taking Aspirin  81 mg and Plavix 75 mg. She is also on Atorvastatin 80 mg.  Stroke Risk Factors - diabetes mellitus, hyperlipidemia, hypertension and CAD,   Plan: 1. PT consult, OT consult, Speech consult 2. Echocardiogram pending. Will require interrogation of LINQ device by cardiology 3.Continue aggressive medical management with dual therapy Aspirin 81 mg/day and Plavix 75 mg /day with intensive management of vascular risk factor to keep systolic BP (SBP) <592 mm Hg (130 mm Hg if diabetic) and low density lipoprotein (LDL) <70 mg/dl, and lifestyle modification with target A1c<7.0.. Continue high potency statin. 4. Will discontinue Depakote  5. NPO until RN stroke swallow screen 6. Telemetry monitoring 7. Frequent neuro checks    Alexis Goodell, MD Neurology [phone]  03/08/2018, 1:51 PM

## 2018-03-08 NOTE — Progress Notes (Addendum)
Inpatient Diabetes Program Recommendations  AACE/ADA: New Consensus Statement on Inpatient Glycemic Control (2015)  Target Ranges:  Prepandial:   less than 140 mg/dL      Peak postprandial:   less than 180 mg/dL (1-2 hours)      Critically ill patients:  140 - 180 mg/dL   Results for Cynthia Dean, Cynthia Dean (MRN [id number]) as of 03/08/2018 10:19  Ref. Range 03/07/2018 13:54  Glucose Latest Ref Range: 70 - 99 mg/dL 147 (H)    Admit with: AMS/ CVA  History: DM, ESRD  Home DM Meds: 70/30 Insulin- 25 units BID (Do Not take on Dialysis days--Tuesday, Thursday, Saturday)  Current Orders: 70/30 Insulin- 20 units BID      MD- Please consider placing orders for Novolog Sensitive Correction Scale/ SSI (0-9 units) TID AC + HS as well  May want to reduce dose of 70/30 Insulin to 10 units BID to start since we are unsure how patient will be eating at this point     --Will follow patient during hospitalization--  Wyn Quaker RN, MSN, CDE Diabetes Coordinator Inpatient Glycemic Control Team Team Pager: [phone] (8a-5p)

## 2018-03-08 NOTE — Evaluation (Addendum)
Clinical/Bedside Swallow Evaluation Patient Details  Name: Cynthia Dean MRN: [id number] Date of Birth: [birth year]  Today's Date: 03/08/2018 Time: SLP Start Time (ACUTE ONLY): 0930 SLP Stop Time (ACUTE ONLY): 1030 SLP Time Calculation (min) (ACUTE ONLY): 60 min  Past Medical History:  Past Medical History:  Diagnosis Date  . COPD (chronic obstructive pulmonary disease) ([place])   . Coronary artery disease   . Diabetes mellitus without complication ([place])   . ESRD on dialysis ([place])   . Heart murmur   . Hyperlipidemia   . Hypertension   . Stroke [facility])    Past Surgical History:  Past Surgical History:  Procedure Laterality Date  . A/V FISTULAGRAM Left 12/20/2016   Procedure: A/V Fistulagram;  Surgeon: Katha Cabal, MD;  Location: [place] [facility];  Service: Cardiovascular;  Laterality: Left;  . A/V SHUNT INTERVENTION N/A 12/20/2016   Procedure: A/V Shunt Intervention;  Surgeon: Katha Cabal, MD;  Location: [place] [facility];  Service: Cardiovascular;  Laterality: N/A;  . A/V SHUNTOGRAM Left 09/11/2017   Procedure: A/V SHUNTOGRAM;  Surgeon: Algernon Huxley, MD;  Location: [place] [facility];  Service: Cardiovascular;  Laterality: Left;  . BREAST BIOPSY Bilateral 07/19/00   neg  . LOOP RECORDER INSERTION N/A 11/16/2017   Procedure: LOOP RECORDER INSERTION;  Surgeon: Deboraha Sprang, MD;  Location: [place] [facility];  Service: Cardiovascular;  Laterality: N/A;  . PERIPHERAL VASCULAR CATHETERIZATION Left 02/02/2015   Procedure: A/V Shuntogram/Fistulagram;  Surgeon: Algernon Huxley, MD;  Location: [place] [facility];  Service: Cardiovascular;  Laterality: Left;  . PERIPHERAL VASCULAR CATHETERIZATION Left 02/02/2015   Procedure: A/V Shunt Intervention;  Surgeon: Algernon Huxley, MD;  Location: [place] [facility];  Service: Cardiovascular;  Laterality: Left;  . PERIPHERAL VASCULAR CATHETERIZATION Left 03/09/2015   Procedure: A/V Shuntogram/Fistulagram;  Surgeon: Algernon Huxley,  MD;  Location: [place] [facility];  Service: Cardiovascular;  Laterality: Left;  . PERIPHERAL VASCULAR CATHETERIZATION N/A 03/09/2015   Procedure: A/V Shunt Intervention;  Surgeon: Algernon Huxley, MD;  Location: [place] [facility];  Service: Cardiovascular;  Laterality: N/A;  . PERIPHERAL VASCULAR CATHETERIZATION Left 04/17/2015   Procedure: Upper Extremity Angiography;  Surgeon: Algernon Huxley, MD;  Location: [place] [facility];  Service: Cardiovascular;  Laterality: Left;  . PERIPHERAL VASCULAR CATHETERIZATION  04/17/2015   Procedure: Upper Extremity Intervention;  Surgeon: Algernon Huxley, MD;  Location: [place] [facility];  Service: Cardiovascular;;  . PERIPHERAL VASCULAR CATHETERIZATION N/A 08/10/2015   Procedure: A/V Shuntogram/Fistulagram;  Surgeon: Algernon Huxley, MD;  Location: [place] [facility];  Service: Cardiovascular;  Laterality: N/A;  . PERIPHERAL VASCULAR CATHETERIZATION N/A 08/10/2015   Procedure: A/V Shunt Intervention;  Surgeon: Algernon Huxley, MD;  Location: [place] [facility];  Service: Cardiovascular;  Laterality: N/A;  . PERIPHERAL VASCULAR CATHETERIZATION Left 04/11/2016   Procedure: A/V Shuntogram/Fistulagram;  Surgeon: Algernon Huxley, MD;  Location: [place] [facility];  Service: Cardiovascular;  Laterality: Left;  . TEE WITHOUT CARDIOVERSION N/A 11/16/2017   Procedure: TRANSESOPHAGEAL ECHOCARDIOGRAM (TEE);  Surgeon: Minna Merritts, MD;  Location: [facility];  Service: Cardiovascular;  Laterality: N/A;   HPI:  Pt is a 67 y.o. female with a known history of CVA/stroke, COPD, coronary artery disease, diabetes, end-stage renal disease on hemodialysis, hyperlipidemia, hypertension who lives with Family and was at her baseline today morning when she woke up.  She went to her dialysis as scheduled in around 10 during the  dialysis they noted her having difficulty with her speech.  She finished her full dialysis session and advised to go to emergency room.  On arrival she denied any other  symptoms other than mild speech problem, she was seen by a neurologist in the ER and they suggested to do an MRI which revealed an acute stroke - cortical infarct involving the left frontal operculum AND Advanced atrophy with chronic small vessel ischemic disease with MULTIPLE Chronic infarcts. Patient was unable to give year at admission, however, coudl given a cue today. Per family, patient has had episodes like this in the past when she was not feeling well prior to a treatment.  Pt was verbally conversive answering general questions re: self and general Cognitive questions. She was easily cueable(1-2 cues needed intermittently w/ few questions) to achieve a more detailed answer; followed general commands accurately - suspect this could be close to/at pt's baseline based on her h/o Strokes and Advance Atrophy(see MRI); unsure of her baseline Cognitive status at home.     Assessment / Plan / Recommendation Clinical Impression  Pt appears to present w/ min oropharyngeal phase dysphagia w/ slight-min increased risk for aspiration w/ oral intake - this risk is reduced when pt follows general aspiration precautions including SMALL sips of liquids SLOWLY. Pt also has a baseline of Advanced Atrophy, strokes as per MRI and is missing lower molars w/ a loose upper denture plate at this time - such dentition issues can impact mastication of solid foods. Pt consumed trials of thin liquids via CUP, purees, and soft solids w/ no consistent, overt s/s of aspiration noted except 1x when she took too large of a sip. No further s/s noted when taking smaller sips, slowly. No decline in vocal quality or respiratory status during oral intake. Oral phase was grossly [facility] for liquids and purees for timely oral phase management and clearing of these boluses, however, pt exhibited min increased oral phase time w/ soft solids d/t missing lower dentition for full mastication. Given time, oral clearing was achieved b/t these trials;  also encouraged pt to use lingual sweeping to fully clear as needed. Of note, slight-min R labial corner of mouth weakness noted in tigh seal resulting in intermittent leakage from the R corner of mouth. Pt was sensate to this and wiped to clear. OM exam revealed R lingual deviation, slightly decreased lingual coordination in rapid movements, and slightly decreased R labial tone; strong cough reflex and response. Pt fed self given setup. Recommend a Mech soft diet d/t dentition status; oral weakness; thin liquids VIA CUP - NO STRAWS. General aspiration precautions; PILLS WHOLE IN PUREE. Setup support at meals. NSG and MD updated. Defer any Dysarthria assessment/tx until discharge to home setting as pt is 100% intelligible - instructed pt on using over-articulation during speaking w/ others for exercise.  SLP Visit Diagnosis: Dysphagia, oropharyngeal phase (R13.12)    Aspiration Risk  Mild aspiration risk(but reduced when following general aspiration precautions)    Diet Recommendation  Dysphagia level 3 (Mech Soft foods for easier mastication); Thin liquids - NO STRAWS. Aspiration precautions.  Medication Administration: Whole meds with puree(for safer swallowing)    Other  Recommendations Recommended Consults: (n/a) Oral Care Recommendations: Oral care BID;Patient independent with oral care Other Recommendations: (n/a)   Follow up Recommendations Home health SLP(unsure if pt drives)      Frequency and Duration min 3x week(and upon discharge TBD)  2 weeks       Prognosis Prognosis for Safe Diet  Advancement: Fair(-Good) Barriers to Reach Goals: Cognitive deficits(unsure of pt's baseline status?)      Swallow Study   General Date of Onset: 03/07/18 HPI: Pt is a 67 y.o. female with a known history of CVA/stroke, COPD, coronary artery disease, diabetes, end-stage renal disease on hemodialysis, hyperlipidemia, hypertension who lives with Family and was at her baseline today morning when she  woke up.  She went to her dialysis as scheduled in around 10 during the dialysis they noted her having difficulty with her speech.  She finished her full dialysis session and advised to go to emergency room.  On arrival she denied any other symptoms other than mild speech problem, she was seen by a neurologist in the ER and they suggested to do an MRI which revealed an acute stroke - cortical infarct involving the left frontal operculum AND Advanced atrophy with chronic small vessel ischemic disease with MULTIPLE Chronic infarcts. Patient was unable to give year at admission, however, coudl given a cue today. Per family, patient has had episodes like this in the past when she was not feeling well prior to a treatment.  Pt was verbally conversive answering general questions re: self and general Cognitive questions. She was easily cueable(1-2 cues needed intermittently) to achieve full answer; followed general commands accurately - suspect this could be close to/at pt's baseline based on her h/o Strokes and Advance Atrophy(see MRI).   Type of Study: Bedside Swallow Evaluation Previous Swallow Assessment: none reported Diet Prior to this Study: NPO(since admission; "regular" diet at home per pt) Temperature Spikes Noted: No(wbc 8.2) Respiratory Status: Room air History of Recent Intubation: No Behavior/Cognition: Alert;Cooperative;Pleasant mood(loose upper denture plate; missing few lower dentition) Oral Cavity Assessment: Within Functional Limits Oral Care Completed by SLP: Recent completion by staff Oral Cavity - Dentition: Dentures, top(few lower native dentition; missing many) Vision: Functional for self-feeding Self-Feeding Abilities: Able to feed self;Needs set up(min) Patient Positioning: Upright in chair Baseline Vocal Quality: Normal;Low vocal intensity(100% intelligible though min slow intermittently) Volitional Cough: Strong Volitional Swallow: Able to elicit    Oral/Motor/Sensory Function  Overall Oral Motor/Sensory Function: Mild impairment Facial ROM: Reduced right(slight in R corner of mouth) Facial Symmetry: Abnormal symmetry right(slight in R corner of mouth) Facial Strength: Reduced right(slight in upper lip/corner of mouth) Facial Sensation: Within Functional Limits Lingual ROM: Within Functional Limits(grossly but slightly slower) Lingual Symmetry: Abnormal symmetry right(min+ R lingual deviation) Lingual Strength: Within Functional Limits(grossly WFL) Lingual Sensation: Within Functional Limits(grossly) Velum: Within Functional Limits Mandible: Within Functional Limits   Ice Chips Ice chips: Within functional limits Presentation: Spoon(fed; 3 trials)   Thin Liquid Thin Liquid: Impaired Presentation: Cup;Self Fed(10 trials) Oral Phase Impairments: Reduced labial seal;Reduced lingual movement/coordination(R corner of mouth leakage intermittently) Oral Phase Functional Implications: Right anterior spillage(R corner of mouth) Pharyngeal  Phase Impairments: Suspected delayed Swallow;Cough - Immediate(x1/10 trials) Other Comments: pt was educated on slowing down and taking SMALLER sips - no further overt coughing noted    Nectar Thick Nectar Thick Liquid: Not tested   Honey Thick Honey Thick Liquid: Not tested   Puree Puree: Within functional limits Presentation: Self Fed;Spoon(8 trials)   Solid     Solid: Impaired Presentation: Self Fed;Spoon(5 trials) Oral Phase Impairments: Impaired mastication(min increased time d/t missing lower molars) Oral Phase Functional Implications: Prolonged oral transit;Impaired mastication(min slower lingual clearing?) Pharyngeal Phase Impairments: (none)      Orinda Kenner, MS, CCC-SLP Dearius Hoffmann 03/08/2018,11:41 AM

## 2018-03-08 NOTE — Progress Notes (Signed)
[place] [facility] [place] NAME: Cynthia Dean    MR#:  [id number]  DATE OF BIRTH:  [birth year]  SUBJECTIVE:   Patient here due to slurred speech and altered mental status and had MRI findings which was suggestive of an acute stroke.  Patient still having some slurred speech but denies any focal weakness or numbness anywhere.  REVIEW OF SYSTEMS:    Review of Systems  Constitutional: Negative for chills and fever.  HENT: Negative for congestion and tinnitus.   Eyes: Negative for blurred vision and double vision.  Respiratory: Negative for cough, shortness of breath and wheezing.   Cardiovascular: Negative for chest pain, orthopnea and PND.  Gastrointestinal: Negative for abdominal pain, diarrhea, nausea and vomiting.  Genitourinary: Negative for dysuria and hematuria.  Neurological: Positive for speech change. Negative for dizziness, sensory change and focal weakness.  All other systems reviewed and are negative.   Nutrition: Mechanical soft w/ thin liquids. Asp. Precautions.  Tolerating Diet: Yes Tolerating PT: Eval noted  DRUG ALLERGIES:   Allergies  Allergen Reactions  . Tape Itching    Skin Dermatitis/itching (tape adhesive)    VITALS:  Blood pressure 140/60, pulse 63, temperature 98 F (36.7 C), temperature source Oral, resp. rate 18, height 5\' 6"  (1.676 m), weight 120.9 kg, SpO2 95 %.  PHYSICAL EXAMINATION:   Physical Exam  GENERAL:  67 y.o.-year-old patient sitting up in chair in no acute distress.  EYES: Pupils equal, round, reactive to light and accommodation. No scleral icterus. Extraocular muscles intact.  HEENT: Head atraumatic, normocephalic. Oropharynx and nasopharynx clear.  NECK:  Supple, no jugular venous distention. No thyroid enlargement, no tenderness.  LUNGS: Normal breath sounds bilaterally, no wheezing, rales, rhonchi. No use of accessory muscles of respiration.  CARDIOVASCULAR: S1, S2 normal. No murmurs, rubs,  or gallops.  ABDOMEN: Soft, nontender, nondistended. Bowel sounds present. No organomegaly or mass.  EXTREMITIES: No cyanosis, clubbing or edema b/l.    NEUROLOGIC: Cranial nerves II through XII are intact. No focal Motor or sensory deficits b/l.   PSYCHIATRIC: The patient is alert and oriented x 3.  SKIN: No obvious rash, lesion, or ulcer.   Left upper extremity AV fistula with good bruit and thrill.  LABORATORY PANEL:   CBC Recent Labs  Lab 03/07/18 1227  WBC 8.2  HGB 14.4  HCT 43.3  PLT 217   ------------------------------------------------------------------------------------------------------------------  Chemistries  Recent Labs  Lab 03/07/18 1354 03/07/18 1600  NA 137  --   K 3.9  --   CL 94*  --   CO2 29  --   GLUCOSE 147*  --   BUN 23  --   CREATININE 6.17*  --   CALCIUM 8.4*  --   MG  --  2.0  AST 19  --   ALT 17  --   ALKPHOS 130*  --   BILITOT 0.6  --    ------------------------------------------------------------------------------------------------------------------  Cardiac Enzymes Recent Labs  Lab 03/07/18 1354  TROPONINI <0.03   ------------------------------------------------------------------------------------------------------------------  RADIOLOGY:  Dg Chest 2 View  Result Date: 03/08/2018 CLINICAL DATA:  Stroke-like symptoms EXAM: CHEST - 2 VIEW COMPARISON:  04/14/2016 FINDINGS: Cardiac shadow is stable. Loop recorder is noted in satisfactory position. The lungs are well aerated bilaterally. No focal infiltrate or sizable effusion is seen. No bony abnormality is noted. IMPRESSION: No active cardiopulmonary disease. Electronically Signed   By: Inez Catalina M.D.   On: 03/08/2018 01:33   Ct Head Wo Contrast  Result Date: 03/07/2018 CLINICAL DATA:  Altered level of consciousness with slurred speech following dialysis EXAM: CT HEAD WITHOUT CONTRAST TECHNIQUE: Contiguous axial images were obtained from the base of the skull through the vertex  without intravenous contrast. COMPARISON:  Head CT November 14, 2017; brain MRI November 15, 2017 FINDINGS: Brain: There is mild diffuse atrophy. There is no intracranial mass, hemorrhage, extra-axial fluid collection, or midline shift. There is evidence of a prior infarct in the mid to posterior right frontal lobe. There is evidence of a prior infarct at the superior, lateral left occipital region. There is patchy small vessel disease in the centra semiovale bilaterally. No acute infarct is evident. Vascular: There is no evident hyperdense vessel. There is calcification in each carotid siphon region. Skull: The bony calvarium appears intact. Sinuses/Orbits: There is mucosal thickening in several ethmoid air cells. Other visualized paranasal sinuses are clear. Visualized orbits appear symmetric bilaterally. Other: Visualized mastoid air cells are clear. IMPRESSION: Atrophy with stable periventricular small vessel disease. Prior right frontal and left occipital lobe infarcts, stable. No acute infarct evident. No mass or hemorrhage. Foci of arterial vascular calcification noted. There is mucosal thickening in several ethmoid air cells. Electronically Signed   By: Lowella Grip III M.D.   On: 03/07/2018 13:18   Mr Brain Wo Contrast  Result Date: 03/07/2018 CLINICAL DATA:  Initial evaluation for acute speech difficulty. EXAM: MRI HEAD WITHOUT CONTRAST TECHNIQUE: Multiplanar, multiecho pulse sequences of the brain and surrounding structures were obtained without intravenous contrast. COMPARISON:  Prior CT from earlier the same day. FINDINGS: Brain: Advanced cerebral atrophy with moderate chronic small vessel ischemic disease, stable from previous. Remote bilateral MCA territory infarcts involving the anterior right frontal and left temporal occipital region again noted. Chronic lacunar infarcts involving the bilateral basal ganglia noted as well. Several scattered remote bilateral cerebellar infarcts. 12 mm curvilinear  focus of restricted diffusion involving the cortical gray matter of the left frontal operculum, consistent with a small acute ischemic infarct (series 100, image 32). No associated hemorrhage or mass effect. No other evidence for acute or subacute ischemia. No mass lesion, midline shift or mass effect. Ventricular prominence related to global parenchymal volume loss without hydrocephalus. No extra-axial fluid collection. Vascular: Major intracranial vascular flow voids are maintained Skull and upper cervical spine: Craniocervical junction normal. Upper cervical spine within normal limits. Bone marrow signal intensity normal. No scalp soft tissue abnormality. Sinuses/Orbits: Globes and orbital soft tissues within normal limits. Paranasal sinuses and mastoid air cells are clear. Inner ear structures normal. Other: None. IMPRESSION: 1. 12 mm acute ischemic nonhemorrhagic cortical infarct involving the left frontal operculum. 2. Advanced atrophy with chronic small vessel ischemic disease with multiple remote chronic infarcts as above, otherwise stable from previous. Electronically Signed   By: Jeannine Boga M.D.   On: 03/07/2018 19:40   US Carotid Bilateral (at [facility] And [facility] Only)  Result Date: 03/08/2018 CLINICAL DATA:  Stroke.  Hypertension, hyperlipidemia, diabetes. EXAM: BILATERAL CAROTID DUPLEX ULTRASOUND TECHNIQUE: Pearline Cables scale imaging, color Doppler and duplex ultrasound were performed of bilateral carotid and vertebral arteries in the neck. COMPARISON:  11/13/2017 and previous FINDINGS: Criteria: Quantification of carotid stenosis is based on velocity parameters that correlate the residual internal carotid diameter with NASCET-based stenosis levels, using the diameter of the distal internal carotid lumen as the denominator for stenosis measurement. The following velocity measurements were obtained: RIGHT ICA: 83/13 cm/sec CCA: 50/5 cm/sec SYSTOLIC ICA/CCA RATIO:  1.2 ECA: 76 cm/sec LEFT ICA: 48/7 cm/sec  CCA: 42/87 cm/sec SYSTOLIC ICA/CCA RATIO:  0.5 ECA: 129 cm/sec RIGHT CAROTID ARTERY: Scattered partially calcified plaque in the common carotid artery. Partially calcified plaque in the bulb resulting in at least mild stenosis. Normal waveforms and color Doppler signal throughout. Mild tortuosity. RIGHT VERTEBRAL ARTERY:  Normal flow direction and waveform. LEFT CAROTID ARTERY: Eccentric partially calcified plaque in the bulb and proximal ICA, without high-grade stenosis. Normal waveforms and color Doppler signal. In complex mixed solid/cystic left thyroid lesion measuring up to 2.5 cm, seen on studies dating back to 04/14/2016. LEFT VERTEBRAL ARTERY:  Normal flow direction and waveform. IMPRESSION: 1. Bilateral carotid plaque resulting in less than 50% diameter stenosis. 2.  Antegrade bilateral vertebral arterial flow. 3. Mixed solid/cystic left thyroid nodule as previously described. Electronically Signed   By: Lucrezia Europe M.D.   On: 03/08/2018 11:08   Mr Jodene Nam Head/brain GO Cm  Result Date: 03/08/2018 CLINICAL DATA:  Follow up stroke. History of end-stage renal disease on dialysis, hypertension, hyperlipidemia and stroke. EXAM: MRA HEAD WITHOUT CONTRAST TECHNIQUE: Angiographic images of the Circle of Willis were obtained using MRA technique without intravenous contrast. COMPARISON:  MRI head March 07, 2018 and MRA head November 12, 2017 FINDINGS: Mild motion degraded examination. ANTERIOR CIRCULATION: Normal flow related enhancement of the included cervical, petrous, cavernous and supraclinoid internal carotid arteries. Patent anterior communicating artery. Patent anterior and middle cerebral arteries, mild luminal irregularity. No large vessel occlusion, flow limiting stenosis, aneurysm. POSTERIOR CIRCULATION: Codominant vertebral arteries. Vertebrobasilar arteries are patent, with normal flow related enhancement of the main branch vessels. Patent posterior cerebral arteries, mild luminal irregularity. Robust  bilateral posterior communicating arteries present. No large vessel occlusion, flow limiting stenosis,  aneurysm. ANATOMIC VARIANTS: None. Source images and MIP images were reviewed. IMPRESSION: 1. Mild motion degraded examination. 2. No emergent large vessel occlusion or flow-limiting stenosis. 3. Mild atherosclerosis versus motion artifact. Electronically Signed   By: Elon Alas M.D.   On: 03/08/2018 01:32     ASSESSMENT AND PLAN:   67 year old female with past medical history of end-stage renal disease on hemodialysis, diabetes, COPD, hypertension, hyperlipidemia, history of previous CVA who presents to the hospital from dialysis due to slurred speech and confusion.  1.  Acute CVA- patient was noted to have an acute stroke in the left frontal operculum on MRI.  This was the cause of her slurred speech.  She still has some slurred speech which improved. -She has no other focal deficits.  Continue aspirin, Plavix.  Carotid duplex are negative for acute pathology. -Await echocardiogram results, appreciate neurology input.  Patient has a Linq device which needs to be interrogated to make sure she has no underlying arrhythmia/atrial fibrillation. - Seen by PT and OT and speech and patient will need home health services.  2.  End-stage renal disease on hemodialysis-patient gets dialysis on Monday Wednesday Friday. -Nephrology has been consulted.  3.  Diabetes type 2 without complication-continue insulin 70/30, will also add some sliding scale insulin coverage.  4.  Hyperlipidemia-continue atorvastatin.  5.  Secondary hyperparathyroidism-continue PhosLo.  6.  Neuropathy-continue gabapentin.  7.  Essential hypertension-continue Toprol, losartan.  All the records are reviewed and case discussed with Care Management/Social Worker. Management plans discussed with the patient, family and they are in agreement.  CODE STATUS: Full code  DVT Prophylaxis: Hep. SQ  TOTAL TIME TAKING CARE OF  THIS PATIENT: 30 minutes.   POSSIBLE D/C IN 1-2 DAYS, DEPENDING ON CLINICAL CONDITION.   Henreitta Leber M.D on 03/08/2018 at  3:02 PM  Between 7am to 6pm - Pager - [phone]  After 6pm go to [email or web address] - Proofreader  [facility]  Office  [phone]  CC: Primary care physician; Lowella Bandy, MD

## 2018-03-08 NOTE — Evaluation (Signed)
Physical Therapy Evaluation Patient Details Name: Cynthia Dean MRN: [id number] DOB: 02-Feb-1951 Today's Date: 03/08/2018   History of Present Illness  67 y/o F with PMH COPD, CAD, DM, ESRD on HD with L fistula, HTN. Presented to ED 8/14 with difficulty speaking after dialysis appointment. Has presented with similar symptoms in the past.  MRI of brain showed acute ischemic non-hemorrhagic coritcal infarct.   Clinical Impression  Pt showed good effort with PT exam but was very limited with her ability to verbalize in appropriate conversational timing and word finding.  There were times where questions were clearly heard but she was unable to respond - she did however get most of her needs and questions across eventually.  Pt with some mild coordination, activity tolerance and balance/confidence issues but was able to walk >100 ft w/o AD and no LOBs as well as negotiate up/down steps slowly but safely.  She is not at her baseline, but with some assist should be able to return home safely.  Biggest need at this point seems to be speech therapy though other intervention is also justified.   Follow Up Recommendations Home health PT    Equipment Recommendations  None recommended by PT    Recommendations for Other Services       Precautions / Restrictions Precautions Precautions: Fall Restrictions Weight Bearing Restrictions: No      Mobility  Bed Mobility Overal bed mobility: Modified Independent             General bed mobility comments: HOB 20 degrees, increased time allotted   Transfers Overall transfer level: Modified independent Equipment used: None Transfers: Sit to/from Stand Sit to Stand: Supervision         General transfer comment: Pt able to stand with good confidence, no LOBs or safety issues  Ambulation/Gait Ambulation/Gait assistance: Supervision Gait Distance (Feet): 125 Feet Assistive device: None       General Gait Details: Pt was able to  ambulate around the center loop w/o AD and no LOBs or excessive fatigue.  She generally did well though she indicates that she is slower and less confident than her baseline and fatigued much quicker than normall as well.   Stairs Stairs: Yes Stairs assistance: Supervision Stair Management: Two rails Number of Stairs: 3 General stair comments: Pt was able to negotiate up/down steps slowly but w/o direct assist or overt safety issues.   Wheelchair Mobility    Modified Rankin (Stroke Patients Only)       Balance Overall balance assessment: Needs assistance Sitting-balance support: Feet supported Sitting balance-Leahy Scale: Normal     Standing balance support: No upper extremity supported Standing balance-Leahy Scale: Good Standing balance comment: Pt with some guarding and is cautious t/o the effort but overall did not have any overt safety/balance issues                             Pertinent Vitals/Pain Pain Assessment: No/denies pain    Home Living Family/patient expects to be discharged to:: Private residence Living Arrangements: Spouse/significant other Available Help at Discharge: Family;Available PRN/intermittently(apparently daughter can be around during the day as needed) Type of Home: House Home Access: Stairs to enter Entrance Stairs-Rails: Can reach both Entrance Stairs-Number of Steps: 2-3 Home Layout: One level Home Equipment: Walker - 2 wheels;Shower seat Additional Comments: does not use equipment at home    Prior Function Level of Independence: Independent  Comments: Pt reports that her fiance performs most IADLs including cooking, cleaning and laundry, pt does not drive, takes bus to HD appointments.      Hand Dominance   Dominant Hand: Right    Extremity/Trunk Assessment   Upper Extremity Assessment Upper Extremity Assessment: Overall WFL for tasks assessed RUE Deficits / Details: 4+/5 shld flexion/abd and elbow  flex/ext LUE Deficits / Details: 4+/5 shld flexion/abd and elbow flex/ext    Lower Extremity Assessment Lower Extremity Assessment: Overall WFL for tasks assessed       Communication   Communication: Expressive difficulties  Cognition Arousal/Alertness: Awake/alert Behavior During Therapy: WFL for tasks assessed/performed Overall Cognitive Status: Within Functional Limits for tasks assessed                                 General Comments: Pt required increased time to appropriately answer orientation questions, increased time for processing and intermittently, min verbal cues.       General Comments      Exercises Other Exercises Other Exercises: Pt educated on falls prevention, scanning environment for safety hazards, energy conservation techniques including sitting for hygiene/grooming tasks. Pt verbalized understanding of safety recommendations.   Assessment/Plan    PT Assessment Patient needs continued PT services  PT Problem List Decreased strength;Decreased coordination;Decreased activity tolerance;Decreased balance;Decreased cognition;Decreased safety awareness       PT Treatment Interventions Gait training;Stair training;Functional mobility training;Therapeutic activities;Therapeutic exercise;Neuromuscular re-education;Balance training;Patient/family education    PT Goals (Current goals can be found in the Care Plan section)  Acute Rehab PT Goals Patient Stated Goal: to go home PT Goal Formulation: With patient Time For Goal Achievement: 03/22/18 Potential to Achieve Goals: Good    Frequency 7X/week   Barriers to discharge        Co-evaluation               AM-PAC PT "6 Clicks" Daily Activity  Outcome Measure Difficulty turning over in bed (including adjusting bedclothes, sheets and blankets)?: None Difficulty moving from lying on back to sitting on the side of the bed? : None Difficulty sitting down on and standing up from a chair  with arms (e.g., wheelchair, bedside commode, etc,.)?: None Help needed moving to and from a bed to chair (including a wheelchair)?: None Help needed walking in hospital room?: A Little Help needed climbing 3-5 steps with a railing? : A Little 6 Click Score: 22    End of Session Equipment Utilized During Treatment: Gait belt Activity Tolerance: Patient tolerated treatment well Patient left: with chair alarm set;with call bell/phone within reach Nurse Communication: Mobility status PT Visit Diagnosis: Muscle weakness (generalized) (M62.81);Difficulty in walking, not elsewhere classified (R26.2)    Time: 3557-3220 PT Time Calculation (min) (ACUTE ONLY): 31 min   Charges:   PT Evaluation $PT Eval Low Complexity: 1 Low PT Treatments $Gait Training: 8-22 mins        Kreg Shropshire, DPT 03/08/2018, 10:22 AM

## 2018-03-08 NOTE — Progress Notes (Signed)
   03/08/18 0730  Clinical Encounter Type  Visited With Patient  Visit Type Initial;Spiritual support  Referral From Nurse  Consult/Referral To Chaplain  Spiritual Encounters  Spiritual Needs Prayer;Emotional   [place] received an OR that Ms. Maiorana was interested in an AD. I presented to the patient's room to find her awake and alert. I educated the patient on the AD process and informed her that once she had completed the form to have a nurse to page a [facility] for completion

## 2018-03-09 ENCOUNTER — Telehealth: Payer: Self-pay

## 2018-03-09 ENCOUNTER — Inpatient Hospital Stay (HOSPITAL_COMMUNITY)
Admit: 2018-03-09 | Discharge: 2018-03-09 | Disposition: A | Discharge status: Home / Self Care | Payer: Medicare HMO | Attending: Neurology | Admitting: Neurology

## 2018-03-09 DIAGNOSIS — R0602 Shortness of breath: Secondary | ICD-10-CM

## 2018-03-09 LAB — GLUCOSE, CAPILLARY
GLUCOSE-CAPILLARY: 166 mg/dL — AB (ref 70–99)
Glucose-Capillary: 193 mg/dL — ABNORMAL HIGH (ref 70–99)

## 2018-03-09 LAB — ECHOCARDIOGRAM COMPLETE
Height: 66 in
WEIGHTICAEL: 4264.58 [oz_av]

## 2018-03-09 MED ORDER — ASPIRIN 81 MG PO TABS: 81.0000 mg | ORAL_TABLET | Freq: Every day | ORAL | 1 refills | Status: AC

## 2018-03-09 MED ORDER — CHLORHEXIDINE GLUCONATE CLOTH 2 % EX PADS: 6.0000 | MEDICATED_PAD | Freq: Every day | CUTANEOUS | Status: DC

## 2018-03-09 MED ORDER — LOPERAMIDE HCL 2 MG PO CAPS
2.0000 mg | ORAL_CAPSULE | Freq: Once | ORAL | Status: DC
  Filled 2018-03-09: qty 1

## 2018-03-09 NOTE — Progress Notes (Signed)
Reveal device interrogated No significant arrhythmia No atrial fibrillation  Signed, Esmond Plants, MD, Ph.D [facility]

## 2018-03-09 NOTE — Progress Notes (Signed)
*  PRELIMINARY RESULTS* Echocardiogram 2D Echocardiogram has been performed.  Cynthia Dean Cynthia Dean 03/09/2018, 9:04 AM

## 2018-03-09 NOTE — Discharge Summary (Signed)
[place] [facility] [place] NAME: Cynthia Dean    MR#:  [id number]  [place]:  04/16/1951  DATE OF ADMISSION:  03/07/2018 ADMITTING PHYSICIAN: Vaughan Basta, MD  DATE OF DISCHARGE: 03/09/2018  PRIMARY CARE PHYSICIAN: Lowella Bandy, MD    ADMISSION DIAGNOSIS:  Ischemic stroke of frontal lobe ([place]) [I63.9]  DISCHARGE DIAGNOSIS:  Active Problems:   CVA (cerebral vascular accident) ([place])   Stroke ([place])   SECONDARY DIAGNOSIS:   Past Medical History:  Diagnosis Date  . COPD (chronic obstructive pulmonary disease) ([place])   . Coronary artery disease   . Diabetes mellitus without complication ([place])   . ESRD on dialysis ([place])   . Heart murmur   . Hyperlipidemia   . Hypertension   . Stroke [facility])     HOSPITAL COURSE:   67 year old female with past medical history of end-stage renal disease on hemodialysis, diabetes, COPD, hypertension, hyperlipidemia, history of previous CVA who presents to the hospital from dialysis due to slurred speech and confusion.  1.  Acute CVA- patient was noted to have an acute stroke in the left frontal operculum on MRI.  This was the cause of her slurred speech.  -Patient was started on aspirin, Plavix.  She is already on high-dose intensity statin.  A neurology consult was obtained they recommended interrogating the patient's Linq device.  The device was interrogated which showed no evidence of atrial fibrillation. -Patient's slurred speech is improved, she has been seen by speech, physical and occupational therapy and the recommend home health services.  Patient is being discharged with home health PT OT and speech. Patient's echocardiogram done shows no evidence of thrombus, with normal ejection fraction.  2.  End-stage renal disease on hemodialysis-patient gets dialysis on Monday Wednesday Friday. - Patient did not want hemodialysis on the day of discharge.  Nephrology was consulted and they  arranged for her to have dialysis tomorrow which is Saturday at 7 AM and patient has been made aware of this.  3.  Diabetes type 2 without complication Patient's blood sugars remained stable on the hospital, she will continue insulin 70/30.  4.  Hyperlipidemia- pt. Will continue atorvastatin.  5.  Secondary hyperparathyroidism-pt. Will continue PhosLo.  6.  Neuropathy- pt. Will continue gabapentin.  7.  Essential hypertension- pt. Will continue Toprol, losartan.  Discharge home with Home Health PT, RN, Speech.   DISCHARGE CONDITIONS:   Stable.   CONSULTS OBTAINED:  Treatment Team:  Catarina Hartshorn, MD Murlean Iba, MD  DRUG ALLERGIES:   Allergies  Allergen Reactions  . Tape Itching    Skin Dermatitis/itching (tape adhesive)    DISCHARGE MEDICATIONS:   Allergies as of 03/09/2018      Reactions   Tape Itching   Skin Dermatitis/itching (tape adhesive)      Medication List    STOP taking these medications   docusate sodium 100 MG capsule Commonly known as:  COLACE   lacosamide 50 MG Tabs tablet Commonly known as:  VIMPAT     TAKE these medications   acetaminophen 325 MG tablet Commonly known as:  TYLENOL Take 2 tablets (650 mg total) by mouth every 4 (four) hours as needed for mild pain (or temp > 37.5 C (99.5 F)).   aspirin 81 MG tablet Take 1 tablet (81 mg total) by mouth daily.   atorvastatin 80 MG tablet Commonly known as:  LIPITOR Take 80 mg by mouth every evening.   budesonide-formoterol 160-4.5 MCG/ACT inhaler Commonly known  as:  SYMBICORT Inhale 2 puffs into the lungs 2 (two) times daily.   calcium acetate 667 MG capsule Commonly known as:  PHOSLO Take 2,001 mg by mouth 3 (three) times daily with meals.   cinacalcet 60 MG tablet Commonly known as:  SENSIPAR Take 60 mg by mouth daily.   clopidogrel 75 MG tablet Commonly known as:  PLAVIX TAKE ONE TABLET BY MOUTH ONCE DAILY   gabapentin 100 MG capsule Commonly known as:   NEURONTIN Take 100 mg by mouth 3 (three) times daily.   insulin NPH-regular Human (70-30) 100 UNIT/ML injection Commonly known as:  NOVOLIN 70/30 Inject 25 Units into the skin 2 (two) times daily with a meal. Except dialysis days. Do not take on Tuesdays, Thursdays, and Saturdays.   ipratropium-albuterol 0.5-2.5 (3) MG/3ML Soln Commonly known as:  DUONEB Take 3 mLs by nebulization every 6 (six) hours as needed (for shortness of breath/ wheezing).   albuterol-ipratropium 18-103 MCG/ACT inhaler Commonly known as:  COMBIVENT Inhale 2 puffs into the lungs 2 (two) times daily.   lidocaine-prilocaine cream Commonly known as:  EMLA Apply 1 application topically as needed (for port access).   losartan 100 MG tablet Commonly known as:  COZAAR Take 100 mg by mouth daily.   metoprolol succinate 100 MG 24 hr tablet Commonly known as:  TOPROL-XL Take 100 mg by mouth every evening. Take with or immediately following a meal.   omeprazole 20 MG capsule Commonly known as:  PRILOSEC Take 20 mg by mouth 2 (two) times daily.   polyethylene glycol packet Commonly known as:  MIRALAX / GLYCOLAX Take 17 g by mouth daily as needed for moderate constipation.   torsemide 100 MG tablet Commonly known as:  DEMADEX Take 100 mg by mouth daily.         DISCHARGE INSTRUCTIONS:   DIET:  Cardiac diet, Diabetic diet and Renal diet  DISCHARGE CONDITION:  Stable  ACTIVITY:  Activity as tolerated  OXYGEN:  Home Oxygen: No.   Oxygen Delivery: room air  DISCHARGE LOCATION:  Home with [place], PT, Speech.    If you experience worsening of your admission symptoms, develop shortness of breath, life threatening emergency, suicidal or homicidal thoughts you must seek medical attention immediately by calling 911 or calling your MD immediately  if symptoms less severe.  You Must read complete instructions/literature along with all the possible adverse reactions/side effects for all the  Medicines you take and that have been prescribed to you. Take any new Medicines after you have completely understood and accpet all the possible adverse reactions/side effects.   Please note  You were cared for by a hospitalist during your hospital stay. If you have any questions about your discharge medications or the care you received while you were in the hospital after you are discharged, you can call the unit and asked to speak with the hospitalist on call if the hospitalist that took care of you is not available. Once you are discharged, your primary care physician will handle any further medical issues. Please note that NO REFILLS for any discharge medications will be authorized once you are discharged, as it is imperative that you return to your primary care physician (or establish a relationship with a primary care physician if you do not have one) for your aftercare needs so that they can reassess your need for medications and monitor your lab values.     Today   Slurred speech is improved, patient denies any numbness, tingling  or any focal weakness.  Will discharge home with home health services today.  Patient is to get hemodialysis tomorrow at 7 AM as arranged by her nephrologist.  VITAL SIGNS:  Blood pressure (!) 148/69, pulse 82, temperature 98.3 F (36.8 C), temperature source Oral, resp. rate 20, height 5\' 6"  (1.676 m), weight 120.9 kg, SpO2 95 %.  I/O:    Intake/Output Summary (Last 24 hours) at 03/09/2018 1524 Last data filed at 03/09/2018 1400 Gross per 24 hour  Intake 680 ml  Output 2 ml  Net 678 ml    PHYSICAL EXAMINATION:   GENERAL:  67 y.o.-year-old patient sitting up in chair in no acute distress.  EYES: Pupils equal, round, reactive to light and accommodation. No scleral icterus. Extraocular muscles intact.  HEENT: Head atraumatic, normocephalic. Oropharynx and nasopharynx clear.  NECK:  Supple, no jugular venous distention. No thyroid enlargement, no  tenderness.  LUNGS: Normal breath sounds bilaterally, no wheezing, rales, rhonchi. No use of accessory muscles of respiration.  CARDIOVASCULAR: S1, S2 normal. No murmurs, rubs, or gallops.  ABDOMEN: Soft, nontender, nondistended. Bowel sounds present. No organomegaly or mass.  EXTREMITIES: No cyanosis, clubbing or edema b/l.    NEUROLOGIC: Cranial nerves II through XII are intact. No focal Motor or sensory deficits b/l.   PSYCHIATRIC: The patient is alert and oriented x 3.  SKIN: No obvious rash, lesion, or ulcer.   Left upper extremity AV fistula with good bruit and thrill.  DATA REVIEW:   CBC Recent Labs  Lab 03/07/18 1227  WBC 8.2  HGB 14.4  HCT 43.3  PLT 217    Chemistries  Recent Labs  Lab 03/07/18 1354 03/07/18 1600  NA 137  --   K 3.9  --   CL 94*  --   CO2 29  --   GLUCOSE 147*  --   BUN 23  --   CREATININE 6.17*  --   CALCIUM 8.4*  --   MG  --  2.0  AST 19  --   ALT 17  --   ALKPHOS 130*  --   BILITOT 0.6  --     Cardiac Enzymes Recent Labs  Lab 03/07/18 1354  TROPONINI <0.03    Microbiology Results  Results for orders placed or performed during the hospital encounter of 03/07/18  MRSA PCR Screening     Status: None   Collection Time: 03/07/18 11:37 PM  Result Value Ref Range Status   MRSA by PCR NEGATIVE NEGATIVE Final    Comment:        The GeneXpert MRSA Assay (FDA approved for NASAL specimens only), is one component of a comprehensive MRSA colonization surveillance program. It is not intended to diagnose MRSA infection nor to guide or monitor treatment for MRSA infections. Performed at [facility], [address] [place], [place] [address]     RADIOLOGY:  Dg Chest 2 View  Result Date: 03/08/2018 CLINICAL DATA:  Stroke-like symptoms EXAM: CHEST - 2 VIEW COMPARISON:  04/14/2016 FINDINGS: Cardiac shadow is stable. Loop recorder is noted in satisfactory position. The lungs are well aerated bilaterally. No focal infiltrate  or sizable effusion is seen. No bony abnormality is noted. IMPRESSION: No active cardiopulmonary disease. Electronically Signed   By: Inez Catalina M.D.   On: 03/08/2018 01:33   Mr Brain Wo Contrast  Result Date: 03/07/2018 CLINICAL DATA:  Initial evaluation for acute speech difficulty. EXAM: MRI HEAD WITHOUT CONTRAST TECHNIQUE: Multiplanar, multiecho pulse sequences of the brain and surrounding  structures were obtained without intravenous contrast. COMPARISON:  Prior CT from earlier the same day. FINDINGS: Brain: Advanced cerebral atrophy with moderate chronic small vessel ischemic disease, stable from previous. Remote bilateral MCA territory infarcts involving the anterior right frontal and left temporal occipital region again noted. Chronic lacunar infarcts involving the bilateral basal ganglia noted as well. Several scattered remote bilateral cerebellar infarcts. 12 mm curvilinear focus of restricted diffusion involving the cortical gray matter of the left frontal operculum, consistent with a small acute ischemic infarct (series 100, image 32). No associated hemorrhage or mass effect. No other evidence for acute or subacute ischemia. No mass lesion, midline shift or mass effect. Ventricular prominence related to global parenchymal volume loss without hydrocephalus. No extra-axial fluid collection. Vascular: Major intracranial vascular flow voids are maintained Skull and upper cervical spine: Craniocervical junction normal. Upper cervical spine within normal limits. Bone marrow signal intensity normal. No scalp soft tissue abnormality. Sinuses/Orbits: Globes and orbital soft tissues within normal limits. Paranasal sinuses and mastoid air cells are clear. Inner ear structures normal. Other: None. IMPRESSION: 1. 12 mm acute ischemic nonhemorrhagic cortical infarct involving the left frontal operculum. 2. Advanced atrophy with chronic small vessel ischemic disease with multiple remote chronic infarcts as above,  otherwise stable from previous. Electronically Signed   By: Jeannine Boga M.D.   On: 03/07/2018 19:40   US Carotid Bilateral (at [facility] And [facility] Only)  Result Date: 03/08/2018 CLINICAL DATA:  Stroke.  Hypertension, hyperlipidemia, diabetes. EXAM: BILATERAL CAROTID DUPLEX ULTRASOUND TECHNIQUE: Pearline Cables scale imaging, color Doppler and duplex ultrasound were performed of bilateral carotid and vertebral arteries in the neck. COMPARISON:  11/13/2017 and previous FINDINGS: Criteria: Quantification of carotid stenosis is based on velocity parameters that correlate the residual internal carotid diameter with NASCET-based stenosis levels, using the diameter of the distal internal carotid lumen as the denominator for stenosis measurement. The following velocity measurements were obtained: RIGHT ICA: 83/13 cm/sec CCA: 62/9 cm/sec SYSTOLIC ICA/CCA RATIO:  1.2 ECA: 76 cm/sec LEFT ICA: 48/7 cm/sec CCA: 52/84 cm/sec SYSTOLIC ICA/CCA RATIO:  0.5 ECA: 129 cm/sec RIGHT CAROTID ARTERY: Scattered partially calcified plaque in the common carotid artery. Partially calcified plaque in the bulb resulting in at least mild stenosis. Normal waveforms and color Doppler signal throughout. Mild tortuosity. RIGHT VERTEBRAL ARTERY:  Normal flow direction and waveform. LEFT CAROTID ARTERY: Eccentric partially calcified plaque in the bulb and proximal ICA, without high-grade stenosis. Normal waveforms and color Doppler signal. In complex mixed solid/cystic left thyroid lesion measuring up to 2.5 cm, seen on studies dating back to 04/14/2016. LEFT VERTEBRAL ARTERY:  Normal flow direction and waveform. IMPRESSION: 1. Bilateral carotid plaque resulting in less than 50% diameter stenosis. 2.  Antegrade bilateral vertebral arterial flow. 3. Mixed solid/cystic left thyroid nodule as previously described. Electronically Signed   By: Lucrezia Europe M.D.   On: 03/08/2018 11:08   Mr Jodene Nam Head/brain XL Cm  Result Date: 03/08/2018 CLINICAL DATA:  Follow up  stroke. History of end-stage renal disease on dialysis, hypertension, hyperlipidemia and stroke. EXAM: MRA HEAD WITHOUT CONTRAST TECHNIQUE: Angiographic images of the Circle of Willis were obtained using MRA technique without intravenous contrast. COMPARISON:  MRI head March 07, 2018 and MRA head November 12, 2017 FINDINGS: Mild motion degraded examination. ANTERIOR CIRCULATION: Normal flow related enhancement of the included cervical, petrous, cavernous and supraclinoid internal carotid arteries. Patent anterior communicating artery. Patent anterior and middle cerebral arteries, mild luminal irregularity. No large vessel occlusion, flow limiting stenosis, aneurysm. POSTERIOR CIRCULATION: Codominant  vertebral arteries. Vertebrobasilar arteries are patent, with normal flow related enhancement of the main branch vessels. Patent posterior cerebral arteries, mild luminal irregularity. Robust bilateral posterior communicating arteries present. No large vessel occlusion, flow limiting stenosis,  aneurysm. ANATOMIC VARIANTS: None. Source images and MIP images were reviewed. IMPRESSION: 1. Mild motion degraded examination. 2. No emergent large vessel occlusion or flow-limiting stenosis. 3. Mild atherosclerosis versus motion artifact. Electronically Signed   By: Elon Alas M.D.   On: 03/08/2018 01:32      Management plans discussed with the patient, family and they are in agreement.  CODE STATUS:     Code Status Orders  (From admission, onward)         Start     Ordered   03/08/18 0005  Full code  Continuous     03/08/18 0004        TOTAL TIME TAKING CARE OF THIS PATIENT: 45 minutes.    Henreitta Leber M.D on 03/09/2018 at 3:24 PM  Between 7am to 6pm - Pager - [phone]  After 6pm go to [email or web address] - Proofreader  [facility]  Office  [phone]  CC: Primary care physician; Lowella Bandy, MD

## 2018-03-09 NOTE — Progress Notes (Signed)
Physical Therapy Treatment Patient Details Name: Cynthia Dean MRN: [id number] DOB: 12-05-1950 Today's Date: 03/09/2018    History of Present Illness 67 y/o F with PMH COPD, CAD, DM, ESRD on HD with L fistula, HTN. Presented to ED 8/14 with difficulty speaking after dialysis appointment. Has presented with similar symptoms in the past.  MRI of brain showed acute ischemic non-hemorrhagic coritcal infarct.     PT Comments    Pt did well with bed mobility and showed good confidence with standing, ambulation.  She was able to walk ~50 ft w/o AD and only minimal cuing.  She had some fatigue (PT did purposefully keep her talking the entire time) with O2 sats on room air dropping from high 90s to high 80s with the prolonged effort.  Overall pt did well and should be safe to go home regarding PT, pt's biggest concerns at this time are her breathing/weezing and her acute speech limitations (that are better today since exam).    Follow Up Recommendations  Home health PT(pt's most pressing need will be speech therapy)     Equipment Recommendations  None recommended by PT    Recommendations for Other Services       Precautions / Restrictions Precautions Precautions: Fall Restrictions Weight Bearing Restrictions: No    Mobility  Bed Mobility Overal bed mobility: Independent                Transfers Overall transfer level: Modified independent Equipment used: None Transfers: Sit to/from Stand Sit to Stand: Supervision         General transfer comment: Pt did well with getting to standing w/o AD, with good confidence, no LOBs  Ambulation/Gait Ambulation/Gait assistance: Supervision Gait Distance (Feet): 500 Feet Assistive device: None       General Gait Details: Pt able to maintain consistent cadence and overall is close to her baseline regarding gait.  She did not have any LOBs or unsteadiness t/o the effort, though she did have some fatigue with O2 gradually  dropping from high 90s down as low as 88% on room air with the prolonged effort.    Stairs             Wheelchair Mobility    Modified Rankin (Stroke Patients Only)       Balance Overall balance assessment: Modified Independent                                          Cognition Arousal/Alertness: Awake/alert Behavior During Therapy: WFL for tasks assessed/performed Overall Cognitive Status: Within Functional Limits for tasks assessed                                        Exercises      General Comments        Pertinent Vitals/Pain Pain Assessment: No/denies pain    Home Living                      Prior Function            PT Goals (current goals can now be found in the care plan section) Progress towards PT goals: Progressing toward goals    Frequency    Min 2X/week      PT Plan Frequency needs to be updated  Co-evaluation              AM-PAC PT "6 Clicks" Daily Activity  Outcome Measure  Difficulty turning over in bed (including adjusting bedclothes, sheets and blankets)?: None Difficulty moving from lying on back to sitting on the side of the bed? : None Difficulty sitting down on and standing up from a chair with arms (e.g., wheelchair, bedside commode, etc,.)?: None Help needed moving to and from a bed to chair (including a wheelchair)?: None Help needed walking in hospital room?: None Help needed climbing 3-5 steps with a railing? : None 6 Click Score: 24    End of Session Equipment Utilized During Treatment: Gait belt Activity Tolerance: Patient tolerated treatment well Patient left: with chair alarm set;with call bell/phone within reach;with family/visitor present;with nursing/sitter in room Nurse Communication: Mobility status PT Visit Diagnosis: Muscle weakness (generalized) (M62.81);Difficulty in walking, not elsewhere classified (R26.2)     Time: 3612-2449 PT Time  Calculation (min) (ACUTE ONLY): 12 min  Charges:  $Gait Training: 8-22 mins                     Kreg Shropshire, DPT 03/09/2018, 10:33 AM

## 2018-03-09 NOTE — Telephone Encounter (Signed)
Transmission received and faxed to [facility].

## 2018-03-09 NOTE — Progress Notes (Addendum)
Pt left prior to discharge instructions being given. Pt refused dialysis today dr Candiss Norse in  At this time to speak with pt. Pt to have dialysis  In am at op facility. Pt had pulled iv out earlier. Pt was having loose stools  Dr Candiss Norse ordered imodium but pt left  Before getting.pt a/o. No distress.

## 2018-03-09 NOTE — Progress Notes (Signed)
Monitor interrogated and device clinic in [place] at [facility] will fax report to our office here at [phone]. Provider notified that report has been sent.

## 2018-03-09 NOTE — Progress Notes (Signed)
[facility], [place] 03/09/18  Subjective:   Patient known to our practice from outpatient dialysis Admitted for stroke. Slurred speech has improved but not back to baseline Reports 5-6 loose stools today. No pain. No blood in stool.     Objective:  Vital signs in last 24 hours:  Temp:  [97.7 F (36.5 C)-98.6 F (37 C)] 98.3 F (36.8 C) (08/16 1022) Pulse Rate:  [66-82] 82 (08/16 1022) Resp:  [12-20] 20 (08/16 1022) BP: (132-160)/(60-118) 148/69 (08/16 1022) SpO2:  [92 %-100 %] 95 % (08/16 1022)  Weight change:  Filed Weights   03/07/18 1208 03/07/18 2302 03/08/18 0500  Weight: 117.9 kg 120 kg 120.9 kg    Intake/Output:    Intake/Output Summary (Last 24 hours) at 03/09/2018 1515 Last data filed at 03/09/2018 1400 Gross per 24 hour  Intake 680 ml  Output 2 ml  Net 678 ml     Physical Exam: General: NAD, sitting up in chair  HEENT Anicteric   Neck supple  Pulm/lungs Clear b/l  CVS/Heart No rub  Abdomen:  Soft, NT  Extremities: No edema  Neurologic: Slurred speech, ambulatory, expressive aphasia  Skin: No acute rashes  Access: Left arm AVF       Basic Metabolic Panel:  Recent Labs  Lab 03/07/18 1354 03/07/18 1600  NA 137  --   K 3.9  --   CL 94*  --   CO2 29  --   GLUCOSE 147*  --   BUN 23  --   CREATININE 6.17*  --   CALCIUM 8.4*  --   MG  --  2.0     CBC: Recent Labs  Lab 03/07/18 1227  WBC 8.2  NEUTROABS 5.4  HGB 14.4  HCT 43.3  MCV 88.2  PLT 217      Lab Results  Component Value Date   HEPBSAG Negative 06/28/2017      Microbiology:  Recent Results (from the past 240 hour(s))  MRSA PCR Screening     Status: None   Collection Time: 03/07/18 11:37 PM  Result Value Ref Range Status   MRSA by PCR NEGATIVE NEGATIVE Final    Comment:        The GeneXpert MRSA Assay (FDA approved for NASAL specimens only), is one component of a comprehensive MRSA colonization surveillance program. It is  not intended to diagnose MRSA infection nor to guide or monitor treatment for MRSA infections. Performed at [facility], [address] [place], [place] [address]     Coagulation Studies: No results for input(s): LABPROT, INR in the last 72 hours.  Urinalysis: No results for input(s): COLORURINE, LABSPEC, PHURINE, GLUCOSEU, HGBUR, BILIRUBINUR, KETONESUR, PROTEINUR, UROBILINOGEN, NITRITE, LEUKOCYTESUR in the last 72 hours.  Invalid input(s): APPERANCEUR    Imaging: Dg Chest 2 View  Result Date: 03/08/2018 CLINICAL DATA:  Stroke-like symptoms EXAM: CHEST - 2 VIEW COMPARISON:  04/14/2016 FINDINGS: Cardiac shadow is stable. Loop recorder is noted in satisfactory position. The lungs are well aerated bilaterally. No focal infiltrate or sizable effusion is seen. No bony abnormality is noted. IMPRESSION: No active cardiopulmonary disease. Electronically Signed   By: Inez Catalina M.D.   On: 03/08/2018 01:33   Mr Brain Wo Contrast  Result Date: 03/07/2018 CLINICAL DATA:  Initial evaluation for acute speech difficulty. EXAM: MRI HEAD WITHOUT CONTRAST TECHNIQUE: Multiplanar, multiecho pulse sequences of the brain and surrounding structures were obtained without intravenous contrast. COMPARISON:  Prior CT from earlier the same day. FINDINGS:  Brain: Advanced cerebral atrophy with moderate chronic small vessel ischemic disease, stable from previous. Remote bilateral MCA territory infarcts involving the anterior right frontal and left temporal occipital region again noted. Chronic lacunar infarcts involving the bilateral basal ganglia noted as well. Several scattered remote bilateral cerebellar infarcts. 12 mm curvilinear focus of restricted diffusion involving the cortical gray matter of the left frontal operculum, consistent with a small acute ischemic infarct (series 100, image 32). No associated hemorrhage or mass effect. No other evidence for acute or subacute ischemia. No mass lesion,  midline shift or mass effect. Ventricular prominence related to global parenchymal volume loss without hydrocephalus. No extra-axial fluid collection. Vascular: Major intracranial vascular flow voids are maintained Skull and upper cervical spine: Craniocervical junction normal. Upper cervical spine within normal limits. Bone marrow signal intensity normal. No scalp soft tissue abnormality. Sinuses/Orbits: Globes and orbital soft tissues within normal limits. Paranasal sinuses and mastoid air cells are clear. Inner ear structures normal. Other: None. IMPRESSION: 1. 12 mm acute ischemic nonhemorrhagic cortical infarct involving the left frontal operculum. 2. Advanced atrophy with chronic small vessel ischemic disease with multiple remote chronic infarcts as above, otherwise stable from previous. Electronically Signed   By: Jeannine Boga M.D.   On: 03/07/2018 19:40   US Carotid Bilateral (at [facility] Only)  Result Date: 03/08/2018 CLINICAL DATA:  Stroke.  Hypertension, hyperlipidemia, diabetes. EXAM: BILATERAL CAROTID DUPLEX ULTRASOUND TECHNIQUE: Pearline Cables scale imaging, color Doppler and duplex ultrasound were performed of bilateral carotid and vertebral arteries in the neck. COMPARISON:  11/13/2017 and previous FINDINGS: Criteria: Quantification of carotid stenosis is based on velocity parameters that correlate the residual internal carotid diameter with NASCET-based stenosis levels, using the diameter of the distal internal carotid lumen as the denominator for stenosis measurement. The following velocity measurements were obtained: RIGHT ICA: 83/13 cm/sec CCA: 10/1 cm/sec SYSTOLIC ICA/CCA RATIO:  1.2 ECA: 76 cm/sec LEFT ICA: 48/7 cm/sec CCA: 75/10 cm/sec SYSTOLIC ICA/CCA RATIO:  0.5 ECA: 129 cm/sec RIGHT CAROTID ARTERY: Scattered partially calcified plaque in the common carotid artery. Partially calcified plaque in the bulb resulting in at least mild stenosis. Normal waveforms and color Doppler signal  throughout. Mild tortuosity. RIGHT VERTEBRAL ARTERY:  Normal flow direction and waveform. LEFT CAROTID ARTERY: Eccentric partially calcified plaque in the bulb and proximal ICA, without high-grade stenosis. Normal waveforms and color Doppler signal. In complex mixed solid/cystic left thyroid lesion measuring up to 2.5 cm, seen on studies dating back to 04/14/2016. LEFT VERTEBRAL ARTERY:  Normal flow direction and waveform. IMPRESSION: 1. Bilateral carotid plaque resulting in less than 50% diameter stenosis. 2.  Antegrade bilateral vertebral arterial flow. 3. Mixed solid/cystic left thyroid nodule as previously described. Electronically Signed   By: Lucrezia Europe M.D.   On: 03/08/2018 11:08   Mr Jodene Nam Head/brain CH Cm  Result Date: 03/08/2018 CLINICAL DATA:  Follow up stroke. History of end-stage renal disease on dialysis, hypertension, hyperlipidemia and stroke. EXAM: MRA HEAD WITHOUT CONTRAST TECHNIQUE: Angiographic images of the Circle of Willis were obtained using MRA technique without intravenous contrast. COMPARISON:  MRI head March 07, 2018 and MRA head November 12, 2017 FINDINGS: Mild motion degraded examination. ANTERIOR CIRCULATION: Normal flow related enhancement of the included cervical, petrous, cavernous and supraclinoid internal carotid arteries. Patent anterior communicating artery. Patent anterior and middle cerebral arteries, mild luminal irregularity. No large vessel occlusion, flow limiting stenosis, aneurysm. POSTERIOR CIRCULATION: Codominant vertebral arteries. Vertebrobasilar arteries are patent, with normal flow related enhancement of the main branch vessels.  Patent posterior cerebral arteries, mild luminal irregularity. Robust bilateral posterior communicating arteries present. No large vessel occlusion, flow limiting stenosis,  aneurysm. ANATOMIC VARIANTS: None. Source images and MIP images were reviewed. IMPRESSION: 1. Mild motion degraded examination. 2. No emergent large vessel occlusion or  flow-limiting stenosis. 3. Mild atherosclerosis versus motion artifact. Electronically Signed   By: Elon Alas M.D.   On: 03/08/2018 01:32     Medications:    . atorvastatin  80 mg Oral QPM  . calcium acetate  2,001 mg Oral TID WC  . [START ON 03/10/2018] Chlorhexidine Gluconate Cloth  6 each Topical Q0600  . cinacalcet  60 mg Oral Q breakfast  . clopidogrel  75 mg Oral Daily  . gabapentin  100 mg Oral TID  . heparin  5,000 Units Subcutaneous Q8H  . insulin aspart  0-5 Units Subcutaneous QHS  . insulin aspart  0-9 Units Subcutaneous TID WC  . insulin aspart protamine- aspart  10 Units Subcutaneous BID WC  . loperamide  2 mg Oral Once  . losartan  100 mg Oral Daily  . metoprolol succinate  100 mg Oral QPM  . mometasone-formoterol  2 puff Inhalation BID  . pantoprazole  40 mg Oral Daily  . torsemide  100 mg Oral Daily   acetaminophen **OR** acetaminophen (TYLENOL) oral liquid 160 mg/5 mL **OR** acetaminophen, ipratropium-albuterol, polyethylene glycol, senna-docusate  Assessment/ Plan:  67 y.o. 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  End-stage renal disease, on hemodialysis    2.  Severe hypertension 3.  Anemia of chronic kidney disease 4.  Secondary hyperparathyroidism 5.  Diabetes type 2 with complications of neuropathy and retinopathy and CKD 6.  Non hemorrhagic Stroke involving left frontal area from small vessel ischemia, causing speech difficulty  Plan: Undergoing neurology evaluation multiple stroke risk factors Aggressive medical management recommended Patient does not want to  stay for HD today. Volume status is acceptable.With loose stools, potassium is likely low HD has been arranged as outpatient tomorrow at 7 am.  Imodium x 1. Patient received miralax at admission      LOS: [place] 8/16/20193:15 [facility] [place], [place]  Note: This note was prepared with Dragon dictation. Any transcription errors are unintentional

## 2018-03-09 NOTE — Progress Notes (Signed)
    Office is working on getting the ILR interrogated.

## 2018-03-09 NOTE — Telephone Encounter (Signed)
Pt facility called about a transmission needing to be sent but the pt is admitted into the hospital. The pt did not have her monitor with her. Tobin Chad and I advised her to use [facility] [employer] and we could print the information off and fax it to her.

## 2018-03-09 NOTE — Care Management Important Message (Signed)
Important Message  Patient Details  Name: Cynthia Dean MRN: [id number] Date of Birth: Jan 16, 1951   Medicare Important Message Given:  Yes    Juliann Pulse A Kourtney Montesinos 03/09/2018, 11:33 AM

## 2018-03-09 NOTE — Care Management (Addendum)
Patient for discharge home today.  Was closed to [facility] 6/22 for stability.  Order for RN PT OT SLP. CM attempted to speak with patient but patient left the unit- even without knowledge of primary nurse.  Referral to [place].  CM found that patient is also a dialysis patient. Left VM message for Elvera Bicker with [facility].  It is documented that nephrology arranged HD for patient at her clinic 8/17 at 7A

## 2018-03-13 ENCOUNTER — Other Ambulatory Visit: Payer: Self-pay

## 2018-03-13 ENCOUNTER — Observation Stay
Admission: EM | Admit: 2018-03-13 | Discharge: 2018-03-15 | Disposition: A | Discharge status: Home / Self Care | Payer: Medicare HMO | Attending: Internal Medicine | Admitting: Internal Medicine

## 2018-03-13 ENCOUNTER — Emergency Department: Payer: Medicare HMO

## 2018-03-13 DIAGNOSIS — Z7951 Long term (current) use of inhaled steroids: Secondary | ICD-10-CM | POA: Diagnosis not present

## 2018-03-13 DIAGNOSIS — Z992 Dependence on renal dialysis: Secondary | ICD-10-CM | POA: Insufficient documentation

## 2018-03-13 DIAGNOSIS — I69331 Monoplegia of upper limb following cerebral infarction affecting right dominant side: Secondary | ICD-10-CM | POA: Insufficient documentation

## 2018-03-13 DIAGNOSIS — Z87891 Personal history of nicotine dependence: Secondary | ICD-10-CM | POA: Insufficient documentation

## 2018-03-13 DIAGNOSIS — N186 End stage renal disease: Secondary | ICD-10-CM | POA: Diagnosis not present

## 2018-03-13 DIAGNOSIS — G40909 Epilepsy, unspecified, not intractable, without status epilepticus: Secondary | ICD-10-CM

## 2018-03-13 DIAGNOSIS — I132 Hypertensive heart and chronic kidney disease with heart failure and with stage 5 chronic kidney disease, or end stage renal disease: Secondary | ICD-10-CM | POA: Insufficient documentation

## 2018-03-13 DIAGNOSIS — R200 Anesthesia of skin: Secondary | ICD-10-CM | POA: Diagnosis present

## 2018-03-13 DIAGNOSIS — J449 Chronic obstructive pulmonary disease, unspecified: Secondary | ICD-10-CM | POA: Insufficient documentation

## 2018-03-13 DIAGNOSIS — Z794 Long term (current) use of insulin: Secondary | ICD-10-CM | POA: Diagnosis not present

## 2018-03-13 DIAGNOSIS — N2581 Secondary hyperparathyroidism of renal origin: Secondary | ICD-10-CM | POA: Diagnosis not present

## 2018-03-13 DIAGNOSIS — E1122 Type 2 diabetes mellitus with diabetic chronic kidney disease: Secondary | ICD-10-CM | POA: Insufficient documentation

## 2018-03-13 DIAGNOSIS — Z79899 Other long term (current) drug therapy: Secondary | ICD-10-CM | POA: Diagnosis not present

## 2018-03-13 DIAGNOSIS — I2510 Atherosclerotic heart disease of native coronary artery without angina pectoris: Secondary | ICD-10-CM | POA: Insufficient documentation

## 2018-03-13 DIAGNOSIS — Z7902 Long term (current) use of antithrombotics/antiplatelets: Secondary | ICD-10-CM | POA: Insufficient documentation

## 2018-03-13 DIAGNOSIS — D631 Anemia in chronic kidney disease: Secondary | ICD-10-CM | POA: Diagnosis not present

## 2018-03-13 DIAGNOSIS — E785 Hyperlipidemia, unspecified: Secondary | ICD-10-CM | POA: Insufficient documentation

## 2018-03-13 DIAGNOSIS — Z823 Family history of stroke: Secondary | ICD-10-CM | POA: Diagnosis not present

## 2018-03-13 DIAGNOSIS — R569 Unspecified convulsions: Secondary | ICD-10-CM | POA: Diagnosis not present

## 2018-03-13 LAB — CBC WITH DIFFERENTIAL/PLATELET
BASOS ABS: 0.2 10*3/uL — AB (ref 0–0.1)
BASOS PCT: 2 %
EOS ABS: 0.1 10*3/uL (ref 0–0.7)
Eosinophils Relative: 1 %
HEMATOCRIT: 39.4 % (ref 35.0–47.0)
HEMOGLOBIN: 13 g/dL (ref 12.0–16.0)
Lymphocytes Relative: 38 %
Lymphs Abs: 3.2 10*3/uL (ref 1.0–3.6)
MCH: 29 pg (ref 26.0–34.0)
MCHC: 33.1 g/dL (ref 32.0–36.0)
MCV: 87.7 fL (ref 80.0–100.0)
Monocytes Absolute: 1 10*3/uL — ABNORMAL HIGH (ref 0.2–0.9)
Monocytes Relative: 13 %
NEUTROS ABS: 3.8 10*3/uL (ref 1.4–6.5)
NEUTROS PCT: 46 %
Platelets: 215 10*3/uL (ref 150–440)
RBC: 4.5 MIL/uL (ref 3.80–5.20)
RDW: 15.6 % — ABNORMAL HIGH (ref 11.5–14.5)
WBC: 8.3 10*3/uL (ref 3.6–11.0)

## 2018-03-13 LAB — COMPREHENSIVE METABOLIC PANEL
ALBUMIN: 3.8 g/dL (ref 3.5–5.0)
ALT: 12 U/L (ref 0–44)
AST: 18 U/L (ref 15–41)
Alkaline Phosphatase: 100 U/L (ref 38–126)
Anion gap: 17 — ABNORMAL HIGH (ref 5–15)
BUN: 48 mg/dL — AB (ref 8–23)
CHLORIDE: 95 mmol/L — AB (ref 98–111)
CO2: 27 mmol/L (ref 22–32)
Calcium: 8.1 mg/dL — ABNORMAL LOW (ref 8.9–10.3)
Creatinine, Ser: 10.14 mg/dL — ABNORMAL HIGH (ref 0.44–1.00)
GFR calc non Af Amer: 3 mL/min — ABNORMAL LOW (ref >60)
GFR, EST AFRICAN AMERICAN: 4 mL/min — AB (ref >60)
GLUCOSE: 178 mg/dL — AB (ref 70–99)
Potassium: 4.4 mmol/L (ref 3.5–5.1)
SODIUM: 139 mmol/L (ref 135–145)
Total Bilirubin: 0.7 mg/dL (ref 0.3–1.2)
Total Protein: 7.7 g/dL (ref 6.5–8.1)

## 2018-03-13 LAB — ETHANOL

## 2018-03-13 LAB — PROTIME-INR
INR: 1.04
Prothrombin Time: 13.5 seconds (ref 11.4–15.2)

## 2018-03-13 MED ORDER — SODIUM CHLORIDE 0.9 % IV SOLN
500.0000 mg | Freq: Once | INTRAVENOUS | Status: AC
  Administered 2018-03-13: 500 mg via INTRAVENOUS
  Filled 2018-03-13: qty 525

## 2018-03-13 MED ORDER — LEVETIRACETAM IN NACL 500 MG/100ML IV SOLN: 500.0000 mg | Freq: Once | INTRAVENOUS | Status: DC

## 2018-03-13 MED ORDER — LORAZEPAM 2 MG/ML IJ SOLN
1.0000 mg | Freq: Once | INTRAMUSCULAR | Status: AC
  Administered 2018-03-13: 1 mg via INTRAVENOUS
  Filled 2018-03-13: qty 1

## 2018-03-13 NOTE — ED Notes (Signed)
MD at bedside. 

## 2018-03-13 NOTE — ED Provider Notes (Addendum)
[facility] Emergency Department Provider Note  ____________________________________________   I have reviewed the triage vital signs and the nursing notes. Where available I have reviewed prior notes and, if possible and indicated, outside hospital notes.    HISTORY  Chief Complaint Numbness    HPI Cynthia Dean is a 67 y.o. female  Who recently went home after his CVA which involved right facial droop with slurred speech and right upper extremity weakness, and that exact distribution today she is noted some twitching, she has not passed out but she has a recurrent persistent twitch in her right face and right arm.  She does not endorse any other change from her baseline.  No fever no chills no fall no headache no stiff neck, no other numbness or weakness no other concerns.  However, this is been off and on since 7 PM today.  She is never had a seizure or partial seizure in her life.  She has weekly speaking which she states she does not believe to be considerably different from when she went home.  Treating better nothing makes it worse no other alleviating or aggravating factors no other prior intervention.  She is ESRD and had dialysis yesterday  Past Medical History:  Diagnosis Date  . COPD (chronic obstructive pulmonary disease) ([place])   . Coronary artery disease   . Diabetes mellitus without complication ([place])   . ESRD on dialysis ([place])   . Heart murmur   . Hyperlipidemia   . Hypertension   . Stroke [facility])     Patient Active Problem List   Diagnosis Date Noted  . Stroke ([place]) 03/07/2018  . Slurred speech 11/14/2017  . Diabetes ([place]) 11/12/2017  . CAD (coronary artery disease) 11/12/2017  . COPD (chronic obstructive pulmonary disease) ([place]) 11/12/2017  . ESRD on dialysis ([place]) 11/12/2017  . Hematemesis 06/27/2017  . CVA (cerebral vascular accident) ([place]) 04/14/2016  . Essential hypertension, benign 11/27/2015  . Hyperlipidemia 11/27/2015   . Obesity 11/27/2015  . Prolonged Q-T interval on ECG 11/26/2015  . Intractable nausea and vomiting 08/08/2015    Past Surgical History:  Procedure Laterality Date  . A/V FISTULAGRAM Left 12/20/2016   Procedure: A/V Fistulagram;  Surgeon: Katha Cabal, MD;  Location: [place] [facility];  Service: Cardiovascular;  Laterality: Left;  . A/V SHUNT INTERVENTION N/A 12/20/2016   Procedure: A/V Shunt Intervention;  Surgeon: Katha Cabal, MD;  Location: [place] [facility];  Service: Cardiovascular;  Laterality: N/A;  . A/V SHUNTOGRAM Left 09/11/2017   Procedure: A/V SHUNTOGRAM;  Surgeon: Algernon Huxley, MD;  Location: [place] [facility];  Service: Cardiovascular;  Laterality: Left;  . BREAST BIOPSY Bilateral 07/19/00   neg  . LOOP RECORDER INSERTION N/A 11/16/2017   Procedure: LOOP RECORDER INSERTION;  Surgeon: Deboraha Sprang, MD;  Location: [place] [facility];  Service: Cardiovascular;  Laterality: N/A;  . PERIPHERAL VASCULAR CATHETERIZATION Left 02/02/2015   Procedure: A/V Shuntogram/Fistulagram;  Surgeon: Algernon Huxley, MD;  Location: [place] [facility];  Service: Cardiovascular;  Laterality: Left;  . PERIPHERAL VASCULAR CATHETERIZATION Left 02/02/2015   Procedure: A/V Shunt Intervention;  Surgeon: Algernon Huxley, MD;  Location: [place] [facility];  Service: Cardiovascular;  Laterality: Left;  . PERIPHERAL VASCULAR CATHETERIZATION Left 03/09/2015   Procedure: A/V Shuntogram/Fistulagram;  Surgeon: Algernon Huxley, MD;  Location: [place] [facility];  Service: Cardiovascular;  Laterality: Left;  . PERIPHERAL VASCULAR CATHETERIZATION N/A 03/09/2015   Procedure: A/V Shunt Intervention;  Surgeon: Algernon Huxley, MD;  Location: [place] [facility];  Service: Cardiovascular;  Laterality: N/A;  . PERIPHERAL VASCULAR CATHETERIZATION Left 04/17/2015   Procedure: Upper Extremity Angiography;  Surgeon: Algernon Huxley, MD;  Location: [place] [facility];  Service: Cardiovascular;  Laterality: Left;  .  PERIPHERAL VASCULAR CATHETERIZATION  04/17/2015   Procedure: Upper Extremity Intervention;  Surgeon: Algernon Huxley, MD;  Location: [place] [facility];  Service: Cardiovascular;;  . PERIPHERAL VASCULAR CATHETERIZATION N/A 08/10/2015   Procedure: A/V Shuntogram/Fistulagram;  Surgeon: Algernon Huxley, MD;  Location: [place] [facility];  Service: Cardiovascular;  Laterality: N/A;  . PERIPHERAL VASCULAR CATHETERIZATION N/A 08/10/2015   Procedure: A/V Shunt Intervention;  Surgeon: Algernon Huxley, MD;  Location: [place] [facility];  Service: Cardiovascular;  Laterality: N/A;  . PERIPHERAL VASCULAR CATHETERIZATION Left 04/11/2016   Procedure: A/V Shuntogram/Fistulagram;  Surgeon: Algernon Huxley, MD;  Location: [place] [facility];  Service: Cardiovascular;  Laterality: Left;  . TEE WITHOUT CARDIOVERSION N/A 11/16/2017   Procedure: TRANSESOPHAGEAL ECHOCARDIOGRAM (TEE);  Surgeon: Minna Merritts, MD;  Location: [facility];  Service: Cardiovascular;  Laterality: N/A;    Prior to Admission medications   Medication Sig Start Date End Date Taking? Authorizing Provider  acetaminophen (TYLENOL) 325 MG tablet Take 2 tablets (650 mg total) by mouth every 4 (four) hours as needed for mild pain (or temp > 37.5 C (99.5 F)). 11/17/17   Nicholes Mango, MD  albuterol-ipratropium (COMBIVENT) 18-103 MCG/ACT inhaler Inhale 2 puffs into the lungs 2 (two) times daily.    [provider]  aspirin 81 MG tablet Take 1 tablet (81 mg total) by mouth daily. 03/09/18 05/08/18  Henreitta Leber, MD  atorvastatin (LIPITOR) 80 MG tablet Take 80 mg by mouth every evening.     [provider]  budesonide-formoterol (SYMBICORT) 160-4.5 MCG/ACT inhaler Inhale 2 puffs into the lungs 2 (two) times daily. 07/01/17   Loletha Grayer, MD  calcium acetate (PHOSLO) 667 MG capsule Take 2,001 mg by mouth 3 (three) times daily with meals.     [provider]  cinacalcet (SENSIPAR) 60 MG tablet Take 60 mg by mouth daily.    [provider]  clopidogrel (PLAVIX) 75 MG tablet TAKE ONE TABLET BY MOUTH ONCE DAILY Patient taking differently: Take 75 mg by mouth daily.  06/14/16   Stegmayer, Joelene Millin A, PA-C  gabapentin (NEURONTIN) 100 MG capsule Take 100 mg by mouth 3 (three) times daily.    [provider]  insulin NPH-regular Human (NOVOLIN 70/30) (70-30) 100 UNIT/ML injection Inject 25 Units into the skin 2 (two) times daily with a meal. Except dialysis days. Do not take on Tuesdays, Thursdays, and Saturdays. 11/17/17   Gouru, Illene Silver, MD  ipratropium-albuterol (DUONEB) 0.5-2.5 (3) MG/3ML SOLN Take 3 mLs by nebulization every 6 (six) hours as needed (for shortness of breath/ wheezing).     [provider]  lidocaine-prilocaine (EMLA) cream Apply 1 application topically as needed (for port access).     [provider]  losartan (COZAAR) 100 MG tablet Take 100 mg by mouth daily. 09/12/17   [provider]  metoprolol succinate (TOPROL-XL) 100 MG 24 hr tablet Take 100 mg by mouth every evening. Take with or immediately following a meal.     [provider]  omeprazole (PRILOSEC) 20 MG capsule Take 20 mg by mouth 2 (two) times daily.     [provider]  polyethylene glycol (MIRALAX / GLYCOLAX) packet Take 17  g by mouth daily as needed for moderate constipation. 11/17/17   Nicholes Mango, MD  torsemide (DEMADEX) 100 MG tablet Take 100 mg by mouth daily. 10/10/17   [provider]    Allergies Tape  Family History  Problem Relation Age of Onset  . Breast cancer Father 56  . Stroke Mother   . Heart attack Mother   . Heart Problems Sister   . Breast cancer Cousin 72       1 st cousin. Maternal     Social History Social History   Tobacco Use  . Smoking status: Former Smoker    Packs/day: 0.00    Types: Cigarettes  . Smokeless tobacco: Never Used  Substance Use Topics  . Alcohol use: No  . Drug use: No    Review of Systems Constitutional: No  fever/chills Eyes: No visual changes. ENT: No sore throat. No stiff neck no neck pain Cardiovascular: Denies chest pain. Respiratory: Denies shortness of breath. Gastrointestinal:   no vomiting.  No diarrhea.  No constipation. Genitourinary: Negative for dysuria. Musculoskeletal: Negative lower extremity swelling Skin: Negative for rash. Neurological: See HPI   ____________________________________________   PHYSICAL EXAM:  VITAL SIGNS: ED Triage Vitals  Enc Vitals Group     BP 03/13/18 2151 (!) 123/38     Pulse Rate 03/13/18 2151 71     Resp 03/13/18 2151 16     Temp 03/13/18 2157 97.6 F (36.4 C)     Temp Source 03/13/18 2157 Oral     SpO2 03/13/18 2151 94 %     Weight 03/13/18 2152 264 lb 8.8 oz (120 kg)     Height 03/13/18 2152 5\' 6"  (1.676 m)     Head Circumference --      Peak Flow --      Pain Score 03/13/18 2152 0     Pain Loc --      Pain Edu? --      Excl. in [place]? --     Constitutional: Alert and oriented. Well appearing and in no acute distress. Eyes: Conjunctivae are normal Head: Atraumatic HEENT: No congestion/rhinnorhea. Mucous membranes are moist.  Oropharynx non-erythematous Neck:   Nontender with no meningismus, no masses, no stridor Cardiovascular: Normal rate, regular rhythm. Grossly normal heart sounds.  Good peripheral circulation. Respiratory: Normal respiratory effort.  No retractions. Lungs CTAB. Abdominal: Soft and nontender. No distention. No guarding no rebound Back:  There is no focal tenderness or step off.  there is no midline tenderness there are no lesions noted. there is no CVA tenderness Musculoskeletal: No lower extremity tenderness, no upper extremity tenderness. No joint effusions, no DVT signs strong distal pulses no edema Neurologic: Patient is somewhat delayed speech which she states is how she went home, patient has a focal rhythmic twitching to the right upper extremity and the right face.  She is able to move that extremity does  seem weaker in comparison to the left.  No other focal weakness or numbness is noted.  No generalized seizure activity noted Skin:  Skin is warm, dry and intact. No rash noted. Psychiatric: Mood and affect are normal. Speech and behavior are normal.  ____________________________________________   LABS (all labs ordered are listed, but only abnormal results are displayed)  Labs Reviewed  CBC WITH DIFFERENTIAL/PLATELET  ETHANOL  URINALYSIS, COMPLETE (UACMP) WITH MICROSCOPIC  URINE DRUG SCREEN, QUALITATIVE ([facility] ONLY)  COMPREHENSIVE METABOLIC PANEL  PROTIME-INR    Pertinent labs  results that were available during my care of  the patient were reviewed by me and considered in my medical decision making (see chart for details). ____________________________________________  EKG  I personally interpreted any EKGs ordered by me or triage Sinus rhythm rate 75 bpm no acute ST elevation or depression, nonspecific ST changes normal axis ____________________________________________  RADIOLOGY  Pertinent labs & imaging results that were available during my care of the patient were reviewed by me and considered in my medical decision making (see chart for details). If possible, patient and/or family made aware of any abnormal findings.  No results found. ____________________________________________    PROCEDURES  Procedure(s) performed: None  Procedures  Critical Care performed: CRITICAL CARE Performed by: Schuyler Amor   Total critical care time: 40 minutes  Critical care time was exclusive of separately billable procedures and treating other patients.  Critical care was necessary to treat or prevent imminent or life-threatening deterioration.  Critical care was time spent personally by me on the following activities: development of treatment plan with patient and/or surrogate as well as nursing, discussions with consultants, evaluation of patient's response to treatment,  examination of patient, obtaining history from patient or surrogate, ordering and performing treatments and interventions, ordering and review of laboratory studies, ordering and review of radiographic studies, pulse oximetry and re-evaluation of patient's condition.   ____________________________________________   INITIAL IMPRESSION / ASSESSMENT AND PLAN / ED COURSE  Pertinent labs & imaging results that were available during my care of the patient were reviewed by me and considered in my medical decision making (see chart for details).  Here with a unusual presentation of twitching in the exact area of her prior recent CVA.  Certainly could be a partial seizure, could be non-epileptiform activity, we are going to give her Ativan which should help either one will obtain a repeat CT scan to make sure there is been no head bleed although patient does not have any headache, and we will reassess closely with electrolytes and observation here.  ----------------------------------------- 10:17 PM on 03/13/2018 -----------------------------------------  Some delay into CT scan his family wanted time to pray which we are accommodating them with .  We have given her Ativan, patient persists with the twitching but otherwise is awake and alert, with no other signs or symptoms of seizure, her heart rate is normal, vital signs are reassuring.  We will see what CT shows, blood work is pending.  ----------------------------------------- 11:23 PM on 03/13/2018 -----------------------------------------  CT scan is reassuring I discussed with Dr. Irish Elders, of neurology.  All of patient's symptoms resolved after Ativan fortunately and at this time she is at her baseline.  The concern is that this was a partial seizure which is certainly not unreasonable.  We will start her on Keppra, 500 mg at the neurologist request given her history of ESRD, and she will be admitted to the hospital for further evaluation by  him in the morning.  Hospitalist is aware.  Very much appreciate the consult    ____________________________________________   FINAL CLINICAL IMPRESSION(S) / ED DIAGNOSES  Final diagnoses:  None      This chart was dictated using voice recognition software.  Despite best efforts to proofread,  errors can occur which can change meaning.      Schuyler Amor, MD 03/13/18 2209    Schuyler Amor, MD 03/13/18 2218    Schuyler Amor, MD 03/13/18 2324    Schuyler Amor, MD 04/01/18 [phone]

## 2018-03-13 NOTE — ED Triage Notes (Addendum)
Pt BIB [facility] from home for right facial numbness/tingling/twitching that has been going on all day. Pt just discharged from hospital on Friday s/p CVA. Speech has been altered since per pt. Blood sugar 190. Dialysis (left arm). Pt alert & oriented x4. ABCs intact. NAD.

## 2018-03-13 NOTE — ED Notes (Signed)
Pt's right side of face has stopped twitching after returning from CT.

## 2018-03-14 ENCOUNTER — Other Ambulatory Visit: Payer: Self-pay

## 2018-03-14 DIAGNOSIS — G40909 Epilepsy, unspecified, not intractable, without status epilepticus: Secondary | ICD-10-CM

## 2018-03-14 DIAGNOSIS — R569 Unspecified convulsions: Secondary | ICD-10-CM | POA: Diagnosis not present

## 2018-03-14 LAB — BASIC METABOLIC PANEL
ANION GAP: 14 (ref 5–15)
BUN: 55 mg/dL — ABNORMAL HIGH (ref 8–23)
CHLORIDE: 100 mmol/L (ref 98–111)
CO2: 27 mmol/L (ref 22–32)
Calcium: 7.8 mg/dL — ABNORMAL LOW (ref 8.9–10.3)
Creatinine, Ser: 10.44 mg/dL — ABNORMAL HIGH (ref 0.44–1.00)
GFR calc non Af Amer: 3 mL/min — ABNORMAL LOW (ref >60)
GFR, EST AFRICAN AMERICAN: 4 mL/min — AB (ref >60)
GLUCOSE: 160 mg/dL — AB (ref 70–99)
POTASSIUM: 4.2 mmol/L (ref 3.5–5.1)
Sodium: 141 mmol/L (ref 135–145)

## 2018-03-14 LAB — GLUCOSE, CAPILLARY
GLUCOSE-CAPILLARY: 104 mg/dL — AB (ref 70–99)
Glucose-Capillary: 138 mg/dL — ABNORMAL HIGH (ref 70–99)
Glucose-Capillary: 161 mg/dL — ABNORMAL HIGH (ref 70–99)

## 2018-03-14 LAB — CBC
HEMATOCRIT: 36.6 % (ref 35.0–47.0)
HEMOGLOBIN: 12 g/dL (ref 12.0–16.0)
MCH: 28.7 pg (ref 26.0–34.0)
MCHC: 32.7 g/dL (ref 32.0–36.0)
MCV: 87.8 fL (ref 80.0–100.0)
Platelets: 191 10*3/uL (ref 150–440)
RBC: 4.17 MIL/uL (ref 3.80–5.20)
RDW: 15.6 % — ABNORMAL HIGH (ref 11.5–14.5)
WBC: 7.1 10*3/uL (ref 3.6–11.0)

## 2018-03-14 LAB — PHOSPHORUS: PHOSPHORUS: 7.7 mg/dL — AB (ref 2.5–4.6)

## 2018-03-14 MED ORDER — METOPROLOL SUCCINATE ER 50 MG PO TB24: 100.0000 mg | ORAL_TABLET | Freq: Every evening | ORAL | Status: DC

## 2018-03-14 MED ORDER — SODIUM CHLORIDE 0.9 % IV SOLN: 100.0000 mL | INTRAVENOUS | Status: DC | PRN

## 2018-03-14 MED ORDER — LEVETIRACETAM 250 MG PO TABS
250.0000 mg | ORAL_TABLET | ORAL | Status: DC
  Filled 2018-03-14: qty 1

## 2018-03-14 MED ORDER — PENTAFLUOROPROP-TETRAFLUOROETH EX AERO
1.0000 "application " | INHALATION_SPRAY | CUTANEOUS | Status: DC | PRN
  Filled 2018-03-14: qty 30

## 2018-03-14 MED ORDER — PANTOPRAZOLE SODIUM 40 MG PO TBEC
40.0000 mg | DELAYED_RELEASE_TABLET | Freq: Every day | ORAL | Status: DC
  Filled 2018-03-14: qty 1

## 2018-03-14 MED ORDER — LIDOCAINE-PRILOCAINE 2.5-2.5 % EX CREA
1.0000 "application " | TOPICAL_CREAM | CUTANEOUS | Status: DC | PRN
  Filled 2018-03-14: qty 5

## 2018-03-14 MED ORDER — ALTEPLASE 2 MG IJ SOLR
2.0000 mg | Freq: Once | INTRAMUSCULAR | Status: DC | PRN
  Filled 2018-03-14: qty 2

## 2018-03-14 MED ORDER — INSULIN ASPART PROT & ASPART (70-30 MIX) 100 UNIT/ML ~~LOC~~ SUSP: 20.0000 [IU] | SUBCUTANEOUS | Status: DC

## 2018-03-14 MED ORDER — LIDOCAINE HCL (PF) 1 % IJ SOLN
5.0000 mL | INTRAMUSCULAR | Status: DC | PRN
  Filled 2018-03-14: qty 5

## 2018-03-14 MED ORDER — GABAPENTIN 100 MG PO CAPS
100.0000 mg | ORAL_CAPSULE | Freq: Three times a day (TID) | ORAL | Status: DC
  Filled 2018-03-14: qty 1

## 2018-03-14 MED ORDER — TORSEMIDE 100 MG PO TABS
100.0000 mg | ORAL_TABLET | Freq: Every day | ORAL | Status: DC
  Filled 2018-03-14: qty 1

## 2018-03-14 MED ORDER — LEVETIRACETAM 250 MG PO TABS: 250.0000 mg | ORAL_TABLET | ORAL | 0 refills | Status: DC

## 2018-03-14 MED ORDER — ACETAMINOPHEN 325 MG PO TABS: 650.0000 mg | ORAL_TABLET | Freq: Four times a day (QID) | ORAL | Status: DC | PRN

## 2018-03-14 MED ORDER — HEPARIN SODIUM (PORCINE) 5000 UNIT/ML IJ SOLN
5000.0000 [IU] | Freq: Three times a day (TID) | INTRAMUSCULAR | Status: DC
  Administered 2018-03-14 (×2): 5000 [IU] via SUBCUTANEOUS
  Filled 2018-03-14: qty 1

## 2018-03-14 MED ORDER — IPRATROPIUM-ALBUTEROL 18-103 MCG/ACT IN AERO: 2.0000 | INHALATION_SPRAY | Freq: Two times a day (BID) | RESPIRATORY_TRACT | Status: DC

## 2018-03-14 MED ORDER — CINACALCET HCL 30 MG PO TABS
60.0000 mg | ORAL_TABLET | Freq: Every day | ORAL | Status: DC
  Filled 2018-03-14: qty 2

## 2018-03-14 MED ORDER — POLYETHYLENE GLYCOL 3350 17 G PO PACK: 17.0000 g | PACK | Freq: Every day | ORAL | Status: DC | PRN

## 2018-03-14 MED ORDER — INSULIN ASPART PROT & ASPART (70-30 MIX) 100 UNIT/ML ~~LOC~~ SUSP: 20.0000 [IU] | Freq: Two times a day (BID) | SUBCUTANEOUS | Status: DC

## 2018-03-14 MED ORDER — ASPIRIN 81 MG PO CHEW
81.0000 mg | CHEWABLE_TABLET | Freq: Every day | ORAL | Status: DC
  Filled 2018-03-14: qty 1

## 2018-03-14 MED ORDER — HYDROCODONE-ACETAMINOPHEN 5-325 MG PO TABS: 1.0000 | ORAL_TABLET | ORAL | Status: DC | PRN

## 2018-03-14 MED ORDER — LOSARTAN POTASSIUM 50 MG PO TABS
100.0000 mg | ORAL_TABLET | Freq: Every day | ORAL | Status: DC
  Filled 2018-03-14: qty 2

## 2018-03-14 MED ORDER — HEPARIN SODIUM (PORCINE) 1000 UNIT/ML DIALYSIS
1000.0000 [IU] | INTRAMUSCULAR | Status: DC | PRN
  Filled 2018-03-14: qty 1

## 2018-03-14 MED ORDER — CHLORHEXIDINE GLUCONATE CLOTH 2 % EX PADS: 6.0000 | MEDICATED_PAD | Freq: Every day | CUTANEOUS | Status: DC

## 2018-03-14 MED ORDER — BISACODYL 5 MG PO TBEC: 5.0000 mg | DELAYED_RELEASE_TABLET | Freq: Every day | ORAL | Status: DC | PRN

## 2018-03-14 MED ORDER — DOCUSATE SODIUM 100 MG PO CAPS
100.0000 mg | ORAL_CAPSULE | Freq: Two times a day (BID) | ORAL | Status: DC
  Filled 2018-03-14: qty 1

## 2018-03-14 MED ORDER — LEVETIRACETAM IN NACL 500 MG/100ML IV SOLN: 500.0000 mg | Freq: Two times a day (BID) | INTRAVENOUS | Status: DC

## 2018-03-14 MED ORDER — INSULIN ASPART 100 UNIT/ML ~~LOC~~ SOLN
0.0000 [IU] | Freq: Three times a day (TID) | SUBCUTANEOUS | Status: DC
  Administered 2018-03-14: 12:00:00 2 [IU] via SUBCUTANEOUS

## 2018-03-14 MED ORDER — ATORVASTATIN CALCIUM 20 MG PO TABS: 80.0000 mg | ORAL_TABLET | Freq: Every evening | ORAL | Status: DC

## 2018-03-14 MED ORDER — SODIUM CHLORIDE 0.9 % IV SOLN
1.0000 g | Freq: Once | INTRAVENOUS | Status: AC
  Administered 2018-03-14: 1 g via INTRAVENOUS
  Filled 2018-03-14: qty 10

## 2018-03-14 MED ORDER — LEVETIRACETAM 1000 MG PO TABS: 1000.0000 mg | ORAL_TABLET | Freq: Every day | ORAL | 0 refills | Status: DC

## 2018-03-14 MED ORDER — CLOPIDOGREL BISULFATE 75 MG PO TABS
75.0000 mg | ORAL_TABLET | Freq: Every day | ORAL | Status: DC
  Filled 2018-03-14: qty 1

## 2018-03-14 MED ORDER — SODIUM CHLORIDE 0.9 % IV SOLN
500.0000 mg | Freq: Two times a day (BID) | INTRAVENOUS | Status: DC
  Administered 2018-03-14: 500 mg via INTRAVENOUS
  Filled 2018-03-14: qty 5
  Filled 2018-03-14: qty 525
  Filled 2018-03-14: qty 5

## 2018-03-14 MED ORDER — FLUTICASONE FUROATE-VILANTEROL 200-25 MCG/INH IN AEPB
1.0000 | INHALATION_SPRAY | Freq: Every day | RESPIRATORY_TRACT | Status: DC
  Administered 2018-03-14: 1 via RESPIRATORY_TRACT
  Filled 2018-03-14: qty 28

## 2018-03-14 MED ORDER — IPRATROPIUM-ALBUTEROL 0.5-2.5 (3) MG/3ML IN SOLN
3.0000 mL | Freq: Four times a day (QID) | RESPIRATORY_TRACT | Status: DC | PRN
  Filled 2018-03-14: qty 3

## 2018-03-14 MED ORDER — ACETAMINOPHEN 650 MG RE SUPP: 650.0000 mg | Freq: Four times a day (QID) | RECTAL | Status: DC | PRN

## 2018-03-14 MED ORDER — GABAPENTIN 250 MG/5ML PO SOLN
150.0000 mg | Freq: Every day | ORAL | Status: DC
  Filled 2018-03-14: qty 3

## 2018-03-14 MED ORDER — CALCIUM ACETATE (PHOS BINDER) 667 MG PO CAPS
2001.0000 mg | ORAL_CAPSULE | Freq: Three times a day (TID) | ORAL | Status: DC
  Administered 2018-03-14: 17:00:00 2001 mg via ORAL
  Filled 2018-03-14 (×4): qty 3

## 2018-03-14 MED ORDER — IPRATROPIUM-ALBUTEROL 0.5-2.5 (3) MG/3ML IN SOLN
3.0000 mL | Freq: Two times a day (BID) | RESPIRATORY_TRACT | Status: DC
  Administered 2018-03-14: 3 mL via RESPIRATORY_TRACT
  Filled 2018-03-14: qty 3

## 2018-03-14 MED ORDER — INSULIN ASPART 100 UNIT/ML ~~LOC~~ SOLN: 0.0000 [IU] | Freq: Every day | SUBCUTANEOUS | Status: DC

## 2018-03-14 MED ORDER — LEVETIRACETAM 500 MG PO TABS: 1000.0000 mg | ORAL_TABLET | Freq: Every day | ORAL | Status: DC

## 2018-03-14 NOTE — Progress Notes (Signed)
Pre HD assessment    03/14/18 1757  Vital Signs  Temp 98.7 F (37.1 C)  Temp Source Oral  Pulse Rate 70  Pulse Rate Source Monitor  Resp 19  BP (!) 147/62  BP Location Right Arm  BP Method Automatic  Patient Position (if appropriate) Lying  Oxygen Therapy  SpO2 97 %  O2 Device Room Air  Pain Assessment  Pain Scale 0-10  Pain Score 0  Dialysis Weight  Weight 122.5 kg  Type of Weight Pre-Dialysis  Time-Out for Hemodialysis  What Procedure? HD  Pt Identifiers(min of two) First/Last Name;MRN/Account#  Correct Site? Yes  Correct Side? Yes  Correct Procedure? Yes  Consents Verified? Yes  Rad Studies Available? N/A  Safety Precautions Reviewed? Yes  Engineer, civil (consulting) Number  (1A)  Station Number 3  UF/Alarm Test Passed  Conductivity: Meter 13.8  Conductivity: Machine  14  pH 7.6  Reverse Osmosis main  Normal Saline Lot Number [id number]  Dialyzer Lot Number [id number]  Disposable Set Lot Number [id number]  Machine Temperature 97.7 F (36.5 C)  Musician and Audible Yes  Blood Lines Intact and Secured Yes  Pre Treatment Patient Checks  Vascular access used during treatment Graft  Hepatitis B Surface Antigen Results Negative  Date Hepatitis B Surface Antigen Drawn 07/08/17  Hepatitis B Surface Antibody 22  Date Hepatitis B Surface Antibody Drawn 07/08/17  Hemodialysis Consent Verified Yes  Hemodialysis Standing Orders Initiated Yes  ECG (Telemetry) Monitor On Yes  Prime Ordered Normal Saline  Length of  DialysisTreatment -hour(s) 3.5 Hour(s)  Dialyzer Elisio 17H NR  Dialysate 2K, 2.5 Ca  Dialysis Anticoagulant None  Dialysate Flow Ordered 800  Blood Flow Rate Ordered 400 mL/min  Ultrafiltration Goal 1.5 Liters  Pre Treatment Labs Phosphorus (PTH)  Dialysis Blood Pressure Support Ordered Normal Saline  Education / Care Plan  Dialysis Education Provided Yes  Documented Education in Care Plan Yes

## 2018-03-14 NOTE — Progress Notes (Signed)
Pre HD assessment    03/14/18 1800  Neurological  Level of Consciousness Alert  Orientation Level Oriented X4  Respiratory  Respiratory Pattern Regular;Unlabored  Chest Assessment Chest expansion symmetrical  Cough Non-productive  Cardiac  ECG Monitor Yes  Vascular  R Radial Pulse +2  L Radial Pulse +2  Edema Generalized  Integumentary  Integumentary (WDL) X  Skin Color Appropriate for ethnicity  Musculoskeletal  Musculoskeletal (WDL) X  Generalized Weakness Yes  Assistive Device None  GU Assessment  Genitourinary (WDL) X  Genitourinary Symptoms  (HD)  Psychosocial  Psychosocial (WDL) WDL

## 2018-03-14 NOTE — Progress Notes (Signed)
All discharge paper gone over with pt, all discharges paper given Tele removed Tele monitor may aware that Tele had been removed and pt being discharged , IV removed and pt dressed and taken down to waiting car and family by  per Security.pt in W /C. V/S W N L p[t stable on departure. no concerns at this time.

## 2018-03-14 NOTE — Progress Notes (Signed)
Spoke with infection control. Pt was tested for MRSA on 03/07/18 and was negative at that time. No need to retest pt. Precautions discontinued.

## 2018-03-14 NOTE — Progress Notes (Signed)
HD tx end    03/14/18 2216  Vital Signs  Pulse Rate 90  Pulse Rate Source Monitor  Resp 17  BP 115/80  BP Location Right Arm  BP Method Automatic  Patient Position (if appropriate) Lying  Oxygen Therapy  SpO2 94 %  O2 Device Room Air  During Hemodialysis Assessment  Dialysis Fluid Bolus Normal Saline  Bolus Amount (mL) 100 mL  Intra-Hemodialysis Comments Tx completed (system clotted, 30 minutes left in tx, MD aware. )

## 2018-03-14 NOTE — Progress Notes (Signed)
HD tx resumed    03/14/18 1910  Vital Signs  Pulse Rate 75  Pulse Rate Source Monitor  Resp 13  BP (!) 155/72  BP Location Right Arm  BP Method Automatic  Patient Position (if appropriate) Lying  Oxygen Therapy  SpO2 97 %  O2 Device Room Therapist, nutritional Number  (2A)  During Hemodialysis Assessment  Blood Flow Rate (mL/min) 400 mL/min  Arterial Pressure (mmHg) -180 mmHg  Venous Pressure (mmHg) 200 mmHg  Transmembrane Pressure (mmHg) 60 mmHg  Ultrafiltration Rate (mL/min) 570 mL/min  Dialysate Flow Rate (mL/min) 800 ml/min  Conductivity: Machine  14  HD Safety Checks Performed Yes  Dialysis Fluid Bolus Normal Saline  Bolus Amount (mL) 250 mL  Intra-Hemodialysis Comments  (HD tx resumed)

## 2018-03-14 NOTE — H&P (Signed)
[facility] NAME: Cynthia Dean    MR#:  [id number]  DATE OF BIRTH:  06/25/1951  DATE OF ADMISSION:  03/13/2018  PRIMARY CARE PHYSICIAN: Lowella Bandy, MD   REQUESTING/REFERRING PHYSICIAN:   CHIEF COMPLAINT:   Chief Complaint  Patient presents with  . Numbness    HISTORY OF PRESENT ILLNESS: Cynthia Dean  is a 67 y.o. female with a known history of stage renal disease, on hemodialysis, COPD, coronary artery disease, diabetes type 2, and recent stroke just 1 week ago.  Patient remained with the following neurologic deficit status post recent stroke: Right facial droop, slurred speech and right upper extremity weakness. Patient presented to emergency room for muscle twitching at the right face and right upper extremity going on for the past 3-4 hours.  Her symptoms resolved with Ativan IV in the emergency room.  Per neurology recommendation, patient's symptoms could have been related to partial seizure.   She denies any other neurologic deficits other than that the above-mentioned right facial droop, slurred speech and right upper extremity weakness, s/p recent stroke.  No fever, chills, no loss of consciousness, no altered mental status. Blood test done emergency room are notable for elevated creatinine level at 10.14 due to end-stage renal disease. CT of the head without contrast shows chronic right frontal and left occipital as well as bilateral tiny cerebellar infarcts.  There is atrophy with chronic small vessel ischemia. No acute intracranial abnormality.  Patient will be admitted for further evaluation and treatment.     PAST MEDICAL HISTORY:   Past Medical History:  Diagnosis Date  . COPD (chronic obstructive pulmonary disease) ([place])   . Coronary artery disease   . Diabetes mellitus without complication ([place])   . ESRD on dialysis ([place])   . Heart murmur   . Hyperlipidemia   . Hypertension   . Stroke [facility])      PAST SURGICAL HISTORY:  Past Surgical History:  Procedure Laterality Date  . A/V FISTULAGRAM Left 12/20/2016   Procedure: A/V Fistulagram;  Surgeon: Katha Cabal, MD;  Location: [place] [facility];  Service: Cardiovascular;  Laterality: Left;  . A/V SHUNT INTERVENTION N/A 12/20/2016   Procedure: A/V Shunt Intervention;  Surgeon: Katha Cabal, MD;  Location: [place] [facility];  Service: Cardiovascular;  Laterality: N/A;  . A/V SHUNTOGRAM Left 09/11/2017   Procedure: A/V SHUNTOGRAM;  Surgeon: Algernon Huxley, MD;  Location: [place] [facility];  Service: Cardiovascular;  Laterality: Left;  . BREAST BIOPSY Bilateral 07/19/00   neg  . LOOP RECORDER INSERTION N/A 11/16/2017   Procedure: LOOP RECORDER INSERTION;  Surgeon: Deboraha Sprang, MD;  Location: [place] [facility];  Service: Cardiovascular;  Laterality: N/A;  . PERIPHERAL VASCULAR CATHETERIZATION Left 02/02/2015   Procedure: A/V Shuntogram/Fistulagram;  Surgeon: Algernon Huxley, MD;  Location: [place] [facility];  Service: Cardiovascular;  Laterality: Left;  . PERIPHERAL VASCULAR CATHETERIZATION Left 02/02/2015   Procedure: A/V Shunt Intervention;  Surgeon: Algernon Huxley, MD;  Location: [place] [facility];  Service: Cardiovascular;  Laterality: Left;  . PERIPHERAL VASCULAR CATHETERIZATION Left 03/09/2015   Procedure: A/V Shuntogram/Fistulagram;  Surgeon: Algernon Huxley, MD;  Location: [place] [facility];  Service: Cardiovascular;  Laterality: Left;  . PERIPHERAL VASCULAR CATHETERIZATION N/A 03/09/2015   Procedure: A/V Shunt Intervention;  Surgeon: Algernon Huxley, MD;  Location: [place] [facility];  Service: Cardiovascular;  Laterality: N/A;  . PERIPHERAL VASCULAR  CATHETERIZATION Left 04/17/2015   Procedure: Upper Extremity Angiography;  Surgeon: Algernon Huxley, MD;  Location: [place] [facility];  Service: Cardiovascular;  Laterality: Left;  . PERIPHERAL VASCULAR CATHETERIZATION  04/17/2015   Procedure: Upper Extremity Intervention;   Surgeon: Algernon Huxley, MD;  Location: [place] [facility];  Service: Cardiovascular;;  . PERIPHERAL VASCULAR CATHETERIZATION N/A 08/10/2015   Procedure: A/V Shuntogram/Fistulagram;  Surgeon: Algernon Huxley, MD;  Location: [place] [facility];  Service: Cardiovascular;  Laterality: N/A;  . PERIPHERAL VASCULAR CATHETERIZATION N/A 08/10/2015   Procedure: A/V Shunt Intervention;  Surgeon: Algernon Huxley, MD;  Location: [place] [facility];  Service: Cardiovascular;  Laterality: N/A;  . PERIPHERAL VASCULAR CATHETERIZATION Left 04/11/2016   Procedure: A/V Shuntogram/Fistulagram;  Surgeon: Algernon Huxley, MD;  Location: [place] [facility];  Service: Cardiovascular;  Laterality: Left;  . TEE WITHOUT CARDIOVERSION N/A 11/16/2017   Procedure: TRANSESOPHAGEAL ECHOCARDIOGRAM (TEE);  Surgeon: Minna Merritts, MD;  Location: [facility];  Service: Cardiovascular;  Laterality: N/A;    SOCIAL HISTORY:  Social History   Tobacco Use  . Smoking status: Former Smoker    Packs/day: 0.00    Types: Cigarettes  . Smokeless tobacco: Never Used  Substance Use Topics  . Alcohol use: No    FAMILY HISTORY:  Family History  Problem Relation Age of Onset  . Breast cancer Father 25  . Stroke Mother   . Heart attack Mother   . Heart Problems Sister   . Breast cancer Cousin 11       1 st cousin. Maternal     DRUG ALLERGIES:  Allergies  Allergen Reactions  . Tape Itching    Skin Dermatitis/itching (tape adhesive)    REVIEW OF SYSTEMS:   CONSTITUTIONAL: No fever, fatigue or weakness.  EYES: No changes in vision.  EARS, NOSE, AND THROAT: No tinnitus or ear pain.  RESPIRATORY: No cough, shortness of breath, wheezing or hemoptysis.  CARDIOVASCULAR: No chest pain, orthopnea, edema.  GASTROINTESTINAL: No nausea, vomiting, diarrhea or abdominal pain.  GENITOURINARY: No dysuria, hematuria.  ENDOCRINE: No polyuria, nocturia. HEMATOLOGY: No bleeding. SKIN: No rash or lesion. MUSCULOSKELETAL: No joint pain at this  time.   NEUROLOGIC: See under HPI. PSYCHIATRY: No anxiety or depression.   MEDICATIONS AT HOME:  Prior to Admission medications   Medication Sig Start Date End Date Taking? Authorizing Provider  acetaminophen (TYLENOL) 325 MG tablet Take 2 tablets (650 mg total) by mouth every 4 (four) hours as needed for mild pain (or temp > 37.5 C (99.5 F)). 11/17/17  Yes Gouru, Illene Silver, MD  albuterol-ipratropium (COMBIVENT) 18-103 MCG/ACT inhaler Inhale 2 puffs into the lungs 2 (two) times daily.   Yes [provider]  aspirin 81 MG tablet Take 1 tablet (81 mg total) by mouth daily. 03/09/18 05/08/18 Yes Sainani, Belia Heman, MD  atorvastatin (LIPITOR) 80 MG tablet Take 80 mg by mouth every evening.    Yes [provider]  calcium acetate (PHOSLO) 667 MG capsule Take 2,001 mg by mouth 3 (three) times daily with meals.    Yes [provider]  cinacalcet (SENSIPAR) 60 MG tablet Take 60 mg by mouth daily.   Yes [provider]  clopidogrel (PLAVIX) 75 MG tablet TAKE ONE TABLET BY MOUTH ONCE DAILY Patient taking differently: Take 75 mg by mouth daily.  06/14/16  Yes Stegmayer, Joelene Millin A, PA-C  gabapentin (NEURONTIN) 100 MG capsule Take 100 mg by mouth 3 (three) times daily.   Yes [provider]  ipratropium-albuterol (DUONEB) 0.5-2.5 (3) MG/3ML SOLN Take 3 mLs by nebulization every 6 (six) hours as needed (for shortness of breath/ wheezing).    Yes [provider]  lidocaine-prilocaine (EMLA) cream Apply 1 application topically as needed (for port access).    Yes [provider]  losartan (COZAAR) 100 MG tablet Take 100 mg by mouth daily. 09/12/17  Yes [provider]  metoprolol succinate (TOPROL-XL) 100 MG 24 hr tablet Take 100 mg by mouth every evening. Take with or immediately following a meal.    Yes [provider]  omeprazole (PRILOSEC) 20 MG capsule Take 20 mg by mouth 2 (two) times daily.    Yes [provider]   polyethylene glycol (MIRALAX / GLYCOLAX) packet Take 17 g by mouth daily as needed for moderate constipation. 11/17/17  Yes Gouru, Illene Silver, MD  budesonide-formoterol (SYMBICORT) 160-4.5 MCG/ACT inhaler Inhale 2 puffs into the lungs 2 (two) times daily. Patient not taking: Reported on 03/13/2018 07/01/17   Loletha Grayer, MD  insulin NPH-regular Human (NOVOLIN 70/30) (70-30) 100 UNIT/ML injection Inject 25 Units into the skin 2 (two) times daily with a meal. Except dialysis days. Do not take on Tuesdays, Thursdays, and Saturdays. 11/17/17   Nicholes Mango, MD  torsemide (DEMADEX) 100 MG tablet Take 100 mg by mouth daily. 10/10/17   [provider]      PHYSICAL EXAMINATION:   VITAL SIGNS: Blood pressure 138/61, pulse 68, temperature 97.6 F (36.4 C), temperature source Oral, resp. rate 18, height 5\' 6"  (1.676 m), weight 120 kg, SpO2 96 %.  GENERAL:  67 y.o.-year-old patient lying in the bed with no acute distress.  EYES: Pupils equal, round, reactive to light and accommodation. No scleral icterus. Extraocular muscles intact.  HEENT: Head atraumatic, normocephalic. Oropharynx and nasopharynx clear.  NECK:  Supple, no jugular venous distention. No thyroid enlargement, no tenderness.  LUNGS: Normal breath sounds bilaterally, no wheezing, rales,rhonchi or crepitation. No use of accessory muscles of respiration.  CARDIOVASCULAR: S1, S2 normal. No S3/S4.  ABDOMEN: Soft, nontender, nondistended. Bowel sounds present. No organomegaly or mass.  EXTREMITIES: No pedal edema, cyanosis, or clubbing.  NEUROLOGIC: Slurred speech, mild right facial droop and right upper extremity weakness noted.  Right face and right arm twitching are resolved at this time. PSYCHIATRIC: The patient is alert and oriented x 3.  SKIN: No obvious rash, lesion, or ulcer.   LABORATORY PANEL:   CBC Recent Labs  Lab 03/07/18 1227 03/13/18 2203  WBC 8.2 8.3  HGB 14.4 13.0  HCT 43.3 39.4  PLT 217 215  MCV 88.2 87.7   MCH 29.2 29.0  MCHC 33.1 33.1  RDW 16.0* 15.6*  LYMPHSABS 1.9 3.2  MONOABS 0.7 1.0*  EOSABS 0.1 0.1  BASOSABS 0.1 0.2*   ------------------------------------------------------------------------------------------------------------------  Chemistries  Recent Labs  Lab 03/07/18 1354 03/07/18 1600 03/13/18 2203  NA 137  --  139  K 3.9  --  4.4  CL 94*  --  95*  CO2 29  --  27  GLUCOSE 147*  --  178*  BUN 23  --  48*  CREATININE 6.17*  --  10.14*  CALCIUM 8.4*  --  8.1*  MG  --  2.0  --   AST 19  --  18  ALT 17  --  12  ALKPHOS 130*  --  100  BILITOT 0.6  --  0.7   ------------------------------------------------------------------------------------------------------------------ estimated creatinine clearance is 7.1 mL/min (A) (by C-G formula based on SCr of  10.14 mg/dL (H)). ------------------------------------------------------------------------------------------------------------------ No results for input(s): TSH, T4TOTAL, T3FREE, THYROIDAB in the last 72 hours.  Invalid input(s): FREET3   Coagulation profile Recent Labs  Lab 03/13/18 2203  INR 1.04   ------------------------------------------------------------------------------------------------------------------- No results for input(s): DDIMER in the last 72 hours. -------------------------------------------------------------------------------------------------------------------  Cardiac Enzymes Recent Labs  Lab 03/07/18 1354  TROPONINI <0.03   ------------------------------------------------------------------------------------------------------------------ Invalid input(s): POCBNP  ---------------------------------------------------------------------------------------------------------------  Urinalysis    Component Value Date/Time   COLORURINE YELLOW (A) 11/15/2017 0801   APPEARANCEUR HAZY (A) 11/15/2017 0801   APPEARANCEUR Clear 06/14/2014 1510   LABSPEC 1.011 11/15/2017 0801   LABSPEC 1.006  06/14/2014 1510   PHURINE 7.0 11/15/2017 0801   GLUCOSEU NEGATIVE 11/15/2017 0801   GLUCOSEU >=500 06/14/2014 1510   HGBUR MODERATE (A) 11/15/2017 0801   BILIRUBINUR NEGATIVE 11/15/2017 0801   BILIRUBINUR Negative 06/14/2014 1510   KETONESUR NEGATIVE 11/15/2017 0801   PROTEINUR 100 (A) 11/15/2017 0801   NITRITE NEGATIVE 11/15/2017 0801   LEUKOCYTESUR MODERATE (A) 11/15/2017 0801   LEUKOCYTESUR Negative 06/14/2014 1510     RADIOLOGY: Ct Head Wo Contrast  Result Date: 03/13/2018 CLINICAL DATA:  Right facial numbness and tingling with altered speech. EXAM: CT HEAD WITHOUT CONTRAST TECHNIQUE: Contiguous axial images were obtained from the base of the skull through the vertex without intravenous contrast. COMPARISON:  CT and MRI 03/07/2018 FINDINGS: Brain: Chronic right frontal and left occipital lobe infarcts with encephalomalacia. Tiny cerebellar infarcts are redemonstrated bilaterally. Chronic tiny bilateral basal ganglial lacunar infarcts. Chronic small vessel ischemia and superficial as well central atrophy is redemonstrated. No acute intracranial hemorrhage, new territorial infarct, intra-axial mass nor extra-axial fluid collections. Vascular: No hyperdense vessel sign. Dense atherosclerosis of the carotid siphons bilaterally. Skull: Intact. Sinuses/Orbits: Nonacute Other: None IMPRESSION: 1. Chronic right frontal and left occipital as well as bilateral tiny cerebellar infarcts. 2. Atrophy with chronic small vessel ischemia. 3. No acute intracranial abnormality. Electronically Signed   By: Ashley Royalty M.D.   On: 03/13/2018 22:40    EKG: Orders placed or performed during the hospital encounter of 03/13/18  . ED EKG  . ED EKG    IMPRESSION AND PLAN:  1.  Focal muscle twitching at the right face and right arm, likely related to partial seizure, resolved with Ativan IV.  Will start patient on Keppra per neurology recommendation.  Continue to monitor clinically closely.  Neurology is  consulted for further evaluation and treatment. 2.  End-stage renal disease.  Continue hemodialysis per nephrology. 3.  COPD, without acute exacerbation.  Continue maintenance therapy. 4.  Coronary artery disease, continue medical treatment. 5.  Right upper extremity paresis, status post recent stroke.  All the records are reviewed and case discussed with ED provider. Management plans discussed with the patient, family and they are in agreement.  CODE STATUS: Full Code Status History    Date Active Date Inactive Code Status Order ID Comments User Context   03/08/2018 0005 03/09/2018 2108 Full Code [id number]  Vaughan Basta, MD Inpatient   11/15/2017 0017 11/17/2017 1809 Full Code [id number]  Lance Coon, MD ED   11/12/2017 2219 11/13/2017 1935 Full Code [id number]  Lance Coon, MD ED   04/14/2016 1330 04/14/2016 1700 Full Code [id number]  Bettey Costa, MD ED   08/08/2015 2257 08/09/2015 2054 Full Code [id number]  Hower, Aaron Mose, MD ED   04/17/2015 1334 04/17/2015 1836 Full Code [id number]  Dew, Erskine Squibb, MD Inpatient       TOTAL TIME TAKING CARE OF THIS PATIENT: 45 minutes.  Amelia Jo M.D on 03/14/2018 at 12:02 AM  Between 7am to 6pm - Pager - [phone]  After 6pm go to [email or web address] - password [email or web address] [facility] [place] [facility]  [phone]  CC: Primary care physician; Lowella Bandy, MD

## 2018-03-14 NOTE — Progress Notes (Signed)
RT to patient bedside for scheduled breathing treatment. Patient stated she did not want a breathing treatment at this time. RT to assess patient. Bilateral breaths sounds are clear, diminished in bases. Patient SAT 95% on Room Air, 16 respirations per minute, Heart rate 75. Breathing treat was not administered at this time.

## 2018-03-14 NOTE — Progress Notes (Signed)
HD tx start    03/14/18 1841  Vital Signs  Pulse Rate 69  Pulse Rate Source Monitor  Resp 18  BP (!) 158/51  BP Location Right Arm  BP Method Automatic  Patient Position (if appropriate) Lying  Oxygen Therapy  SpO2 97 %  O2 Device Room Air  During Hemodialysis Assessment  Blood Flow Rate (mL/min) 400 mL/min  Arterial Pressure (mmHg) -180 mmHg  Venous Pressure (mmHg) 220 mmHg  Transmembrane Pressure (mmHg) 60 mmHg  Ultrafiltration Rate (mL/min) 560 mL/min  Dialysate Flow Rate (mL/min) 800 ml/min  Conductivity: Machine  400  HD Safety Checks Performed Yes  Dialysis Fluid Bolus Normal Saline  Bolus Amount (mL) 250 mL  Intra-Hemodialysis Comments Tx initiated

## 2018-03-14 NOTE — Progress Notes (Signed)
Nurse called, pt's dialysis will be late- will keep here overnight- discharge tomorrow morning.

## 2018-03-14 NOTE — Progress Notes (Signed)
   03/14/18 1525  Clinical Encounter Type  Visited With Patient and family together  Visit Type Initial;Spiritual support;Social support  Referral From Patient  Consult/Referral To Chaplain  Spiritual Encounters  Spiritual Needs Other (Comment)   Ch was approached by the patient's family while the [facility] was rounding on 1C. The family member ask if I could get him some ice and water. I retrieved both and returned to the patient's room. The patient was awake and alert. I offered my support if needed. I will follow up if needed.

## 2018-03-14 NOTE — Progress Notes (Signed)
Post HD assessment. Pt tolerated tx well without c/o. Pt system clotted with 30 minutes left in tx, unable to rinse back pt's venous access. Net UF 1284, goal not met. Difficulty accessing pt's venous access caused a delay in HD tx, machine malfunction during tx caused a second delay in HD tx.New setup and machine required, MD aware.    03/14/18 2228  Vital Signs  Temp 98.6 F (37 C)  Temp Source Oral  Pulse Rate 95  Pulse Rate Source Monitor  Resp (!) 21  BP (!) 138/58  BP Location Right Arm  BP Method Automatic  Patient Position (if appropriate) Lying  Oxygen Therapy  SpO2 95 %  O2 Device Room Air  Dialysis Weight  Weight 120.2 kg  Type of Weight Post-Dialysis  Post-Hemodialysis Assessment  Rinseback Volume (mL) 100 mL  KECN 63.4 V  Dialyzer Clearance Lightly streaked  Duration of HD Treatment -hour(s) 3 hour(s)  Hemodialysis Intake (mL) 700 mL  UF Total -Machine (mL) 1984 mL  Net UF (mL) 1284 mL  Tolerated HD Treatment Yes  AVG/AVF Arterial Site Held (minutes) 10 minutes  AVG/AVF Venous Site Held (minutes) 10 minutes  Education / Care Plan  Dialysis Education Provided Yes  Documented Education in Care Plan Yes

## 2018-03-14 NOTE — Progress Notes (Signed)
Post HD assessment    03/14/18 2226  Neurological  Level of Consciousness Alert  Orientation Level Oriented X4  Respiratory  Respiratory Pattern Regular;Unlabored  Chest Assessment Chest expansion symmetrical  Cough Non-productive  Cardiac  ECG Monitor Yes  Vascular  R Radial Pulse +2  L Radial Pulse +2  Edema Generalized  Integumentary  Integumentary (WDL) X  Skin Color Appropriate for ethnicity  Musculoskeletal  Musculoskeletal (WDL) X  Generalized Weakness Yes  Assistive Device None  GU Assessment  Genitourinary (WDL) X  Genitourinary Symptoms  (HD)  Psychosocial  Psychosocial (WDL) WDL

## 2018-03-14 NOTE — Progress Notes (Signed)
[facility]  ROUNDING NOTE   Subjective:  Patient came in with right facial twitching as well as right upper extremity twitching. Neurology felt that this could be a partial seizure. Patient undergoes dialysis on Monday, Wednesday, Friday. She states that she did go to her regularly scheduled treatment on Monday. She will be due for dialysis again today. She has a left upper extremity AV fistula in place.   Objective:  Vital signs in last 24 hours:  Temp:  [97.5 F (36.4 C)-97.9 F (36.6 C)] 97.5 F (36.4 C) (08/21 0335) Pulse Rate:  [68-76] 74 (08/21 0335) Resp:  [16-18] 17 (08/21 0335) BP: (123-155)/(38-75) 155/75 (08/21 0335) SpO2:  [90 %-96 %] 95 % (08/21 0742) Weight:  [120 kg-120.2 kg] 120.2 kg (08/21 0109)  Weight change:  Filed Weights   03/13/18 2152 03/14/18 0109  Weight: 120 kg 120.2 kg    Intake/Output: I/O last 3 completed shifts: In: 105.3 [IV Piggyback:105.3] Out: -    Intake/Output this shift:  No intake/output data recorded.  Physical Exam: General: No acute distress  Head: Normocephalic, atraumatic. Moist oral mucosal membranes  Eyes: Anicteric  Neck: Supple, trachea midline  Lungs:  Clear to auscultation, normal effort  Heart: S1S2 no rubs  Abdomen:  Soft, nontender, bowel sounds present  Extremities:  peripheral edema.  Neurologic: Awake, alert, following commands  Skin: No lesions  Access: LUE AVF    Basic Metabolic Panel: Recent Labs  Lab 03/07/18 1354 03/07/18 1600 03/13/18 2203 03/14/18 0318  NA 137  --  139 141  K 3.9  --  4.4 4.2  CL 94*  --  95* 100  CO2 29  --  27 27  GLUCOSE 147*  --  178* 160*  BUN 23  --  48* 55*  CREATININE 6.17*  --  10.14* 10.44*  CALCIUM 8.4*  --  8.1* 7.8*  MG  --  2.0  --   --     Liver Function Tests: Recent Labs  Lab 03/07/18 1354 03/13/18 2203  AST 19 18  ALT 17 12  ALKPHOS 130* 100  BILITOT 0.6 0.7  PROT 8.5* 7.7  ALBUMIN 4.3 3.8   No results for input(s): LIPASE,  AMYLASE in the last 168 hours. No results for input(s): AMMONIA in the last 168 hours.  CBC: Recent Labs  Lab 03/07/18 1227 03/13/18 2203 03/14/18 0318  WBC 8.2 8.3 7.1  NEUTROABS 5.4 3.8  --   HGB 14.4 13.0 12.0  HCT 43.3 39.4 36.6  MCV 88.2 87.7 87.8  PLT 217 215 191    Cardiac Enzymes: Recent Labs  Lab 03/07/18 1354  TROPONINI <0.03    BNP: Invalid input(s): POCBNP  CBG: Recent Labs  Lab 03/07/18 1155 03/08/18 2205 03/09/18 0751 03/09/18 1237 03/14/18 0814  GLUCAP 149* 202* 166* 193* 104*    Microbiology: Results for orders placed or performed during the hospital encounter of 03/07/18  MRSA PCR Screening     Status: None   Collection Time: 03/07/18 11:37 PM  Result Value Ref Range Status   MRSA by PCR NEGATIVE NEGATIVE Final    Comment:        The GeneXpert MRSA Assay (FDA approved for NASAL specimens only), is one component of a comprehensive MRSA colonization surveillance program. It is not intended to diagnose MRSA infection nor to guide or monitor treatment for MRSA infections. Performed at [facility], [address] [place], [place] [address]     Coagulation Studies: Recent Labs  03/13/18 2203  LABPROT 13.5  INR 1.04    Urinalysis: No results for input(s): COLORURINE, LABSPEC, PHURINE, GLUCOSEU, HGBUR, BILIRUBINUR, KETONESUR, PROTEINUR, UROBILINOGEN, NITRITE, LEUKOCYTESUR in the last 72 hours.  Invalid input(s): APPERANCEUR    Imaging: Ct Head Wo Contrast  Result Date: 03/13/2018 CLINICAL DATA:  Right facial numbness and tingling with altered speech. EXAM: CT HEAD WITHOUT CONTRAST TECHNIQUE: Contiguous axial images were obtained from the base of the skull through the vertex without intravenous contrast. COMPARISON:  CT and MRI 03/07/2018 FINDINGS: Brain: Chronic right frontal and left occipital lobe infarcts with encephalomalacia. Tiny cerebellar infarcts are redemonstrated bilaterally. Chronic tiny bilateral basal  ganglial lacunar infarcts. Chronic small vessel ischemia and superficial as well central atrophy is redemonstrated. No acute intracranial hemorrhage, new territorial infarct, intra-axial mass nor extra-axial fluid collections. Vascular: No hyperdense vessel sign. Dense atherosclerosis of the carotid siphons bilaterally. Skull: Intact. Sinuses/Orbits: Nonacute Other: None IMPRESSION: 1. Chronic right frontal and left occipital as well as bilateral tiny cerebellar infarcts. 2. Atrophy with chronic small vessel ischemia. 3. No acute intracranial abnormality. Electronically Signed   By: Ashley Royalty M.D.   On: 03/13/2018 22:40     Medications:   . levETIRAcetam (KEPPRA) IVPB Stopped (03/14/18 0325)   . aspirin  81 mg Oral Daily  . atorvastatin  80 mg Oral QPM  . calcium acetate  2,001 mg Oral TID WC  . Chlorhexidine Gluconate Cloth  6 each Topical Q0600  . cinacalcet  60 mg Oral Daily  . clopidogrel  75 mg Oral Daily  . docusate sodium  100 mg Oral BID  . fluticasone furoate-vilanterol  1 puff Inhalation Daily  . gabapentin  100 mg Oral TID  . heparin  5,000 Units Subcutaneous Q8H  . insulin aspart  0-15 Units Subcutaneous TID WC  . insulin aspart  0-5 Units Subcutaneous QHS  . insulin aspart protamine- aspart  20 Units Subcutaneous BID WC  . ipratropium-albuterol  3 mL Nebulization BID  . losartan  100 mg Oral Daily  . metoprolol succinate  100 mg Oral QPM  . pantoprazole  40 mg Oral Daily  . torsemide  100 mg Oral Daily   acetaminophen **OR** acetaminophen, bisacodyl, HYDROcodone-acetaminophen, ipratropium-albuterol, polyethylene glycol  Assessment/ Plan:  67 y.o. 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   [facility]WF/[facility] [place] [facility]/ 240 MIN/ 120 KG/MWF  1.  ESRD on HD MWF.  Patient due for hemodialysis today.  We will go ahead and prepare orders for dialysis today.  Ultrafiltration target 1.5 kg.  2.  Hypertension.  Maintain the patient on losartan and metoprolol for blood pressure control.  3.  Anemia of chronic kidney disease.  Hemoglobin currently 12.0.  Hold off on Epogen.  4.  Secondary hyperparathyroidism.  Maintain the patient on current doses of calcium acetate as well as cinacalcet.  Check PTH and phosphorus today.   LOS: 0 Cynthia Dean 8/21/201911:19 AM

## 2018-03-14 NOTE — Progress Notes (Signed)
HD tx paused, machine malfunction, new setup and new machine required.    03/14/18 1850  Vital Signs  Pulse Rate 78  Pulse Rate Source Monitor  Resp 14  BP (!) 193/77  BP Location Right Arm  BP Method Automatic  Patient Position (if appropriate) Lying  Oxygen Therapy  SpO2 93 %  O2 Device Room Air  During Hemodialysis Assessment  Intra-Hemodialysis Comments  (tx paused)

## 2018-03-14 NOTE — Consult Note (Addendum)
Reason for Consult: Seizure  Referring Physician: Amelia Jo, MD  CC: Right facial and arm muscle twitching and numbness  HPI: Cynthia Dean is an 67 y.o. female seen in consultation with admitting physician for evaluation of possible seizure disorder. Patient was recently discharge from the hospital following acute ischemic cortical infarct involving the left frontal operculum causing speech/language difficulty and right arm weakness. She presented to the ED on 03/13/2018 with persistent right facial and arm twitching and numbness. She state that her arm felt very weak and could not close or hold an object with it. She also had episode of generalized "stretching". Symptoms were lasting whole day so she called EMS due to concerns for possible seizures. She denies LOC, . On arrival to the ED she was noted to have facial twitching with no  posturing, head turning, tongue biting, eye deviation or loss of bladder function or post ictal state. Ativan IV administered which stopped the twitching. She report that symptoms are now improving. Denies numbness or twitching.   Past Medical History:  Diagnosis Date  . COPD (chronic obstructive pulmonary disease) ([place])   . Coronary artery disease   . Diabetes mellitus without complication ([place])   . ESRD on dialysis ([place])   . Heart murmur   . Hyperlipidemia   . Hypertension   . Stroke [facility])     Past Surgical History:  Procedure Laterality Date  . A/V FISTULAGRAM Left 12/20/2016   Procedure: A/V Fistulagram;  Surgeon: Katha Cabal, MD;  Location: [place] [facility];  Service: Cardiovascular;  Laterality: Left;  . A/V SHUNT INTERVENTION N/A 12/20/2016   Procedure: A/V Shunt Intervention;  Surgeon: Katha Cabal, MD;  Location: [place] [facility];  Service: Cardiovascular;  Laterality: N/A;  . A/V SHUNTOGRAM Left 09/11/2017   Procedure: A/V SHUNTOGRAM;  Surgeon: Algernon Huxley, MD;  Location: [place] [facility];  Service:  Cardiovascular;  Laterality: Left;  . BREAST BIOPSY Bilateral 07/19/00   neg  . LOOP RECORDER INSERTION N/A 11/16/2017   Procedure: LOOP RECORDER INSERTION;  Surgeon: Deboraha Sprang, MD;  Location: [place] [facility];  Service: Cardiovascular;  Laterality: N/A;  . PERIPHERAL VASCULAR CATHETERIZATION Left 02/02/2015   Procedure: A/V Shuntogram/Fistulagram;  Surgeon: Algernon Huxley, MD;  Location: [place] [facility];  Service: Cardiovascular;  Laterality: Left;  . PERIPHERAL VASCULAR CATHETERIZATION Left 02/02/2015   Procedure: A/V Shunt Intervention;  Surgeon: Algernon Huxley, MD;  Location: [place] [facility];  Service: Cardiovascular;  Laterality: Left;  . PERIPHERAL VASCULAR CATHETERIZATION Left 03/09/2015   Procedure: A/V Shuntogram/Fistulagram;  Surgeon: Algernon Huxley, MD;  Location: [place] [facility];  Service: Cardiovascular;  Laterality: Left;  . PERIPHERAL VASCULAR CATHETERIZATION N/A 03/09/2015   Procedure: A/V Shunt Intervention;  Surgeon: Algernon Huxley, MD;  Location: [place] [facility];  Service: Cardiovascular;  Laterality: N/A;  . PERIPHERAL VASCULAR CATHETERIZATION Left 04/17/2015   Procedure: Upper Extremity Angiography;  Surgeon: Algernon Huxley, MD;  Location: [place] [facility];  Service: Cardiovascular;  Laterality: Left;  . PERIPHERAL VASCULAR CATHETERIZATION  04/17/2015   Procedure: Upper Extremity Intervention;  Surgeon: Algernon Huxley, MD;  Location: [place] [facility];  Service: Cardiovascular;;  . PERIPHERAL VASCULAR CATHETERIZATION N/A 08/10/2015   Procedure: A/V Shuntogram/Fistulagram;  Surgeon: Algernon Huxley, MD;  Location: [place] [facility];  Service: Cardiovascular;  Laterality: N/A;  . PERIPHERAL VASCULAR CATHETERIZATION N/A 08/10/2015   Procedure: A/V Shunt Intervention;  Surgeon: Algernon Huxley,  MD;  Location: [place] [facility];  Service: Cardiovascular;  Laterality: N/A;  . PERIPHERAL VASCULAR CATHETERIZATION Left 04/11/2016   Procedure: A/V Shuntogram/Fistulagram;   Surgeon: Algernon Huxley, MD;  Location: [place] [facility];  Service: Cardiovascular;  Laterality: Left;  . TEE WITHOUT CARDIOVERSION N/A 11/16/2017   Procedure: TRANSESOPHAGEAL ECHOCARDIOGRAM (TEE);  Surgeon: Minna Merritts, MD;  Location: [facility];  Service: Cardiovascular;  Laterality: N/A;    Family History  Problem Relation Age of Onset  . Breast cancer Father 44  . Stroke Mother   . Heart attack Mother   . Heart Problems Sister   . Breast cancer Cousin 38       1 st cousin. Maternal     Social History:  reports that she has quit smoking. Her smoking use included cigarettes. She smoked 0.00 packs per day. She has never used smokeless tobacco. She reports that she does not drink alcohol or use drugs.  Allergies  Allergen Reactions  . Tape Itching    Skin Dermatitis/itching (tape adhesive)    Medications:  I have reviewed the patient's current medications. Scheduled: . aspirin  81 mg Oral Daily  . atorvastatin  80 mg Oral QPM  . calcium acetate  2,001 mg Oral TID WC  . cinacalcet  60 mg Oral Daily  . clopidogrel  75 mg Oral Daily  . docusate sodium  100 mg Oral BID  . fluticasone furoate-vilanterol  1 puff Inhalation Daily  . gabapentin  100 mg Oral TID  . heparin  5,000 Units Subcutaneous Q8H  . insulin aspart  0-15 Units Subcutaneous TID WC  . insulin aspart  0-5 Units Subcutaneous QHS  . insulin aspart protamine- aspart  20 Units Subcutaneous BID WC  . ipratropium-albuterol  3 mL Nebulization BID  . losartan  100 mg Oral Daily  . metoprolol succinate  100 mg Oral QPM  . pantoprazole  40 mg Oral Daily  . torsemide  100 mg Oral Daily    ROS: General ROS: negative for - chills, fatigue, fever, night sweats, weight gain or weight loss Psychological ROS: negative for - behavioral disorder, hallucinations, memory difficulties, mood swings or suicidal ideation Ophthalmic ROS: negative for - blurry vision, double vision, eye pain or loss of vision ENT ROS: negative for  - epistaxis, nasal discharge, oral lesions, sore throat, tinnitus or vertigo Allergy and Immunology ROS: negative for - hives or itchy/watery eyes Hematological and Lymphatic ROS: negative for - bleeding problems, bruising or swollen lymph nodes Endocrine ROS: negative for - galactorrhea, hair pattern changes, polydipsia/polyuria or temperature intolerance Respiratory ROS: negative for - cough, hemoptysis,   Cardiovascular ROS: negative for - chest pain, dyspnea on exertion, edema or irregular heartbeat Gastrointestinal ROS: negative for - abdominal pain hematemesis, nausea/vomiting or stool incontinence. Positive for loose stools. Genito-Urinary ROS: negative for - dysuria, hematuria, incontinence or urinary frequency/urgency Musculoskeletal ROS: negative for - joint swelling or muscular weakness Neurological ROS: as noted in HPI Dermatological ROS: negative for rash and skin lesion changes  Physical Examination: Blood pressure (!) 155/75, pulse 74, temperature (!) 97.5 F (36.4 C), temperature source Oral, resp. rate 17, height 5\' 7"  (1.702 m), weight 120.2 kg, SpO2 95 %.  General Exam Patient looks appropriate of age, well built, nourished and appropriately groomed.  Cardiovascular Exam: S1, S2 heart sounds, murmur present Carotid exam revealed no bruit Lung exam was clear to auscultation ? Neurological Exam  Mental Status: Alert, Oriented to time, place, person and situation Attention span  and concentration seemed appropriate Memory seemed OK. Intact naming, repetition, comprehension.  Followed 2 step commands - Mild dysarthria, moderate expressive aphasia  Fund of knowledge seemed appropriate for age and health status.  Cranial Nerves: I. Olfactory not examined II: Visual fields were full. Pupils were equal, round and reactive to light and accommodation III,IV, VI: ptosis not present, extra-ocular motions intact bilaterally V,VII: smile asymmetric on the right, decreased  sensation in the right face. VIII: Finger rub was heard symmetric in both ears IX, X: Palate and uvular movements are normal and oral sensations are OK, gag reflex deffered XI: Neck muscle strength and shoulder shrug is normal XII: Tongue deviated to the right  Motor Exam: Tone is normal in all extremities Muscle strength in all extremities is 5/5. Mild right arm drift without pronation No abnormal movements, fasciculations or atrophy seen  Deep Tendon Reflexes: Right Biceps is 2+, Left Biceps is 2+ Right Triceps is 2+, Left Triceps is 2+ Right Brachioradialis is 2+, Left Brachioradialis is 2+ Right Knee Jerk is 2+, Left Knee Jerk is 2+ Right Ankle Jerk is 2+, Left Ankle Jerk is 2+ Right Toes are down going, Left Toes are down going  Sensory Exam: Sensations were intact to light touch in all extremities Vibration and proprioception are also intact  Co-ordination: Finger to nose is normal  Gait: Gait and station are normal when examined in small exam room Romberg's test is negative  Data Reviewed  Laboratory Studies:   Basic Metabolic Panel: Recent Labs  Lab 03/07/18 1354 03/07/18 1600 03/13/18 2203 03/14/18 0318  NA 137  --  139 141  K 3.9  --  4.4 4.2  CL 94*  --  95* 100  CO2 29  --  27 27  GLUCOSE 147*  --  178* 160*  BUN 23  --  48* 55*  CREATININE 6.17*  --  10.14* 10.44*  CALCIUM 8.4*  --  8.1* 7.8*  MG  --  2.0  --   --     Liver Function Tests: Recent Labs  Lab 03/07/18 1354 03/13/18 2203  AST 19 18  ALT 17 12  ALKPHOS 130* 100  BILITOT 0.6 0.7  PROT 8.5* 7.7  ALBUMIN 4.3 3.8   No results for input(s): LIPASE, AMYLASE in the last 168 hours. No results for input(s): AMMONIA in the last 168 hours.  CBC: Recent Labs  Lab 03/07/18 1227 03/13/18 2203 03/14/18 0318  WBC 8.2 8.3 7.1  NEUTROABS 5.4 3.8  --   HGB 14.4 13.0 12.0  HCT 43.3 39.4 36.6  MCV 88.2 87.7 87.8  PLT 217 215 191    Cardiac Enzymes: Recent Labs  Lab  03/07/18 1354  TROPONINI <0.03    BNP: Invalid input(s): POCBNP  CBG: Recent Labs  Lab 03/07/18 1155 03/08/18 2205 03/09/18 0751 03/09/18 1237 03/14/18 0814  GLUCAP 149* 202* 166* 193* 104*    Microbiology: Results for orders placed or performed during the hospital encounter of 03/07/18  MRSA PCR Screening     Status: None   Collection Time: 03/07/18 11:37 PM  Result Value Ref Range Status   MRSA by PCR NEGATIVE NEGATIVE Final    Comment:        The GeneXpert MRSA Assay (FDA approved for NASAL specimens only), is one component of a comprehensive MRSA colonization surveillance program. It is not intended to diagnose MRSA infection nor to guide or monitor treatment for MRSA infections. Performed at [facility], [address] [place],  [place] [address]     Coagulation Studies: Recent Labs    03/13/18 2201/10/23  LABPROT 13.5  INR 1.04    Urinalysis: No results for input(s): COLORURINE, LABSPEC, PHURINE, GLUCOSEU, HGBUR, BILIRUBINUR, KETONESUR, PROTEINUR, UROBILINOGEN, NITRITE, LEUKOCYTESUR in the last 168 hours.  Invalid input(s): APPERANCEUR  Lipid Panel:     Component Value Date/Time   CHOL 202 (H) 03/07/2018 1227   CHOL 78 12/25/2013 0413   TRIG 222 (H) 03/07/2018 1227   TRIG 53 12/25/2013 0413   HDL 46 03/07/2018 1227   HDL 31 (L) 12/25/2013 0413   CHOLHDL 4.4 03/07/2018 1227   VLDL 44 (H) 03/07/2018 1227   VLDL 11 12/25/2013 0413   LDLCALC 112 (H) 03/07/2018 1227   LDLCALC 36 12/25/2013 0413    HgbA1C:  Lab Results  Component Value Date   HGBA1C 7.2 (H) 03/07/2018    Urine Drug Screen:      Component Value Date/Time   LABOPIA NONE DETECTED 11/14/2017 10-24-2154   COCAINSCRNUR NONE DETECTED 11/14/2017 10/24/2154   LABBENZ NONE DETECTED 11/14/2017 2154-10-24   AMPHETMU NONE DETECTED 11/14/2017 10-24-54   THCU POSITIVE (A) 11/14/2017 10/24/54   LABBARB NONE DETECTED 11/14/2017 Oct 24, 2154    Alcohol Level:  Recent Labs  Lab 03/13/18 10-23-01  ETH <10     Other results: EKG: normal sinus rhythm, nonspecific ST and T waves changes.  Imaging: Ct Head Wo Contrast  Result Date: 03/13/2018 CLINICAL DATA:  Right facial numbness and tingling with altered speech. EXAM: CT HEAD WITHOUT CONTRAST TECHNIQUE: Contiguous axial images were obtained from the base of the skull through the vertex without intravenous contrast. COMPARISON:  CT and MRI 03/07/2018 FINDINGS: Brain: Chronic right frontal and left occipital lobe infarcts with encephalomalacia. Tiny cerebellar infarcts are redemonstrated bilaterally. Chronic tiny bilateral basal ganglial lacunar infarcts. Chronic small vessel ischemia and superficial as well central atrophy is redemonstrated. No acute intracranial hemorrhage, new territorial infarct, intra-axial mass nor extra-axial fluid collections. Vascular: No hyperdense vessel sign. Dense atherosclerosis of the carotid siphons bilaterally. Skull: Intact. Sinuses/Orbits: Nonacute Other: None IMPRESSION: 1. Chronic right frontal and left occipital as well as bilateral tiny cerebellar infarcts. 2. Atrophy with chronic small vessel ischemia. 3. No acute intracranial abnormality. Electronically Signed   By: Ashley Royalty M.D.   On: 03/13/2018 22:40   Assessment: 67 y.o. female  with recent acute ischemic cortical infarct involving the left frontal operculum causing speech/language difficulty and right arm weakness now presenting with right facial and arm muscle twitching and numbness.  Concerns for partial seizures in a patient with history of remote chronic infarcts in bilateral MCA territory involving the anterior right frontal and left temporal occipital region, and multiple subcortical infarcts. She has had prior abnormal EEG on 11/16/2017. She is currently on Keppra, previously unable to tolerate Depakote due to side effects of diarrhea and Vimpat due to cost. CT head personally reviewed which showed no acute abnormality other than multiple chronic infarcts.  Labs revealed hypocalcemia likely due to  decreased kidney functions. Repeat this morning showed worsening hypocalcemia from 8.1 to 7.8 which may cause neuromuscular irritability of nerves and muscles causing twitching and numbness, however unlikely in this case due to unilateral presentation.   Plan: 1. Will not repeat EEG at this time, she has had recent EEG on 11/16/2017 which showed abnormal electroencephalogram secondary to intermittent periods of left hemispheric slowing and independent left and right temporal sharp transients; may also suggest an epileptogenic potential.  2. Continue Keppra 500 mg once daily at  bedtime. Will need extra dose of Keppra after dialysis 3. Recommend calcium gluconate replacement 4. Check ionized calcium and Albumin levels 5. Continuesecondary stroke prevention with aggressive medical management with dual therapy Aspirin 81 mg/day and Plavix 75 mg /day and intensive management of vascular risk factor to keep systolic BP (SBP) <997 mm Hg (130 mm Hg if diabetic) and low density lipoprotein (LDL) <70 mg/dl, and lifestyle modification with target A1c<7.0..Continuehigh potency statin.  This patient was staffed with Dr. Irish Elders, Alease Frame who personally evaluated patient, reviewed documentation and agreed with assessment and plan of care as above.  Rufina Falco, DNP, FNP-BC Board certified Nurse Practitioner Neurology Department   Agree with above.  Pt was not able to afford vimpat. 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   Keppra 500 daily and 500 post HD No need for EEG  D/c planning

## 2018-03-14 NOTE — Progress Notes (Signed)
HD tx delay r/t access issues.    03/14/18 1841  Vital Signs  Pulse Rate 69  Pulse Rate Source Monitor  Resp 18  BP (!) 158/51  BP Location Right Arm  BP Method Automatic  Patient Position (if appropriate) Lying  Oxygen Therapy  SpO2 97 %  O2 Device Room Air  During Hemodialysis Assessment  Blood Flow Rate (mL/min) 400 mL/min  Arterial Pressure (mmHg) -180 mmHg  Venous Pressure (mmHg) 220 mmHg  Transmembrane Pressure (mmHg) 60 mmHg  Ultrafiltration Rate (mL/min) 560 mL/min  Dialysate Flow Rate (mL/min) 800 ml/min  Conductivity: Machine  400  HD Safety Checks Performed Yes  Dialysis Fluid Bolus Normal Saline  Bolus Amount (mL) 250 mL  Intra-Hemodialysis Comments Tx initiated

## 2018-03-15 LAB — HIV ANTIBODY (ROUTINE TESTING W REFLEX): HIV Screen 4th Generation wRfx: NONREACTIVE

## 2018-03-15 NOTE — Discharge Instructions (Signed)
Discharge instructions provided pt verbalizes understanding. V/S WNL pt stable on departure to home, pt taken downstairs to waiting car and family by security. Pt taken by W/C

## 2018-03-16 LAB — PARATHYROID HORMONE, INTACT (NO CA): PTH: 131 pg/mL — ABNORMAL HIGH (ref 15–65)

## 2018-03-20 NOTE — Discharge Summary (Signed)
Cynthia Dean NAME: Cynthia Dean    MR#:  [id number]  [place]:  [birth year]  DATE OF ADMISSION:  03/13/2018 ADMITTING PHYSICIAN: Cynthia Jo, MD  DATE OF DISCHARGE: 03/15/2018 12:00 AM  PRIMARY CARE PHYSICIAN: Cynthia Bandy, MD    ADMISSION DIAGNOSIS:  Seizure, partial ([place]) [R56.9]  DISCHARGE DIAGNOSIS:  Active Problems:   Partial seizure ([place])   SECONDARY DIAGNOSIS:   Past Medical History:  Diagnosis Date  . COPD (chronic obstructive pulmonary disease) ([place])   . Coronary artery disease   . Diabetes mellitus without complication ([place])   . ESRD on dialysis ([place])   . Heart murmur   . Hyperlipidemia   . Hypertension   . Stroke [facility])     HOSPITAL COURSE:   1.  Focal muscle twitching at the right face and right arm, likely related to partial seizure, resolved with Ativan IV.  Will start patient on Keppra per neurology recommendation.  Continue to monitor clinically closely.  Neurology is consulted for further evaluation and treatment.  Suggested to discharge with keppra. Follow with neuro clinic. 2.  End-stage renal disease.  Continue hemodialysis per nephrology. 3.  COPD, without acute exacerbation.  Continue maintenance therapy. 4.  Coronary artery disease, continue medical treatment. 5.  Right upper extremity paresis, status post recent stroke.  DISCHARGE CONDITIONS:   Stable.  CONSULTS OBTAINED:  Treatment Team:  Cynthia Pain, MD Cynthia Legato, MD  DRUG ALLERGIES:   Allergies  Allergen Reactions  . Tape Itching    Skin Dermatitis/itching (tape adhesive)    DISCHARGE MEDICATIONS:   Allergies as of 03/15/2018      Reactions   Tape Itching   Skin Dermatitis/itching (tape adhesive)      Medication List    TAKE these medications   acetaminophen 325 MG tablet Commonly known as:  TYLENOL Take 2 tablets (650 mg total) by mouth every 4 (four) hours as needed for mild Dean (or  temp > 37.5 C (99.5 F)).   aspirin 81 MG tablet Take 1 tablet (81 mg total) by mouth daily.   atorvastatin 80 MG tablet Commonly known as:  LIPITOR Take 80 mg by mouth every evening.   budesonide-formoterol 160-4.5 MCG/ACT inhaler Commonly known as:  SYMBICORT Inhale 2 puffs into the lungs 2 (two) times daily.   calcium acetate 667 MG capsule Commonly known as:  PHOSLO Take 2,001 mg by mouth 3 (three) times daily with meals.   cinacalcet 60 MG tablet Commonly known as:  SENSIPAR Take 60 mg by mouth daily.   clopidogrel 75 MG tablet Commonly known as:  PLAVIX TAKE ONE TABLET BY MOUTH ONCE DAILY   gabapentin 100 MG capsule Commonly known as:  NEURONTIN Take 100 mg by mouth 3 (three) times daily.   insulin NPH-regular Human (70-30) 100 UNIT/ML injection Commonly known as:  NOVOLIN 70/30 Inject 25 Units into the skin 2 (two) times daily with a meal. Except dialysis days. Do not take on Tuesdays, Thursdays, and Saturdays.   ipratropium-albuterol 0.5-2.5 (3) MG/3ML Soln Commonly known as:  DUONEB Take 3 mLs by nebulization every 6 (six) hours as needed (for shortness of breath/ wheezing).   albuterol-ipratropium 18-103 MCG/ACT inhaler Commonly known as:  COMBIVENT Inhale 2 puffs into the lungs 2 (two) times daily.   levETIRAcetam 250 MG tablet Commonly known as:  KEPPRA Take 1 tablet (250 mg total) by mouth 3 (three) times a week. Take after dialysis on Monday, wed and  Friday in addition to regular dose.   levETIRAcetam 1000 MG tablet Commonly known as:  KEPPRA Take 1 tablet (1,000 mg total) by mouth daily.   lidocaine-prilocaine cream Commonly known as:  EMLA Apply 1 application topically as needed (for port access).   losartan 100 MG tablet Commonly known as:  COZAAR Take 100 mg by mouth daily.   metoprolol succinate 100 MG 24 hr tablet Commonly known as:  TOPROL-XL Take 100 mg by mouth every evening. Take with or immediately following a meal.   omeprazole 20  MG capsule Commonly known as:  PRILOSEC Take 20 mg by mouth 2 (two) times daily.   polyethylene glycol packet Commonly known as:  MIRALAX / GLYCOLAX Take 17 g by mouth daily as needed for moderate constipation.   torsemide 100 MG tablet Commonly known as:  DEMADEX Take 100 mg by mouth daily.        DISCHARGE INSTRUCTIONS:    Follow with neuro clinic.  If you experience worsening of your admission symptoms, develop shortness of breath, life threatening emergency, suicidal or homicidal thoughts you must seek medical attention immediately by calling 911 or calling your MD immediately  if symptoms less severe.  You Must read complete instructions/literature along with all the possible adverse reactions/side effects for all the Medicines you take and that have been prescribed to you. Take any new Medicines after you have completely understood and accept all the possible adverse reactions/side effects.   Please note  You were cared for by a hospitalist during your hospital stay. If you have any questions about your discharge medications or the care you received while you were in the hospital after you are discharged, you can call the unit and asked to speak with the hospitalist on call if the hospitalist that took care of you is not available. Once you are discharged, your primary care physician will handle any further medical issues. Please note that NO REFILLS for any discharge medications will be authorized once you are discharged, as it is imperative that you return to your primary care physician (or establish a relationship with a primary care physician if you do not have one) for your aftercare needs so that they can reassess your need for medications and monitor your lab values.    Today   CHIEF COMPLAINT:   Chief Complaint  Patient presents with  . Numbness    HISTORY OF PRESENT ILLNESS:  Cynthia Dean  is a 67 y.o. female with a known history of  stage renal disease, on  hemodialysis, COPD, coronary artery disease, diabetes type 2, and recent stroke just 1 week ago.  Patient remained with the following neurologic deficit status post recent stroke: Right facial droop, slurred speech and right upper extremity weakness. Patient presented to emergency room for muscle twitching at the right face and right upper extremity going on for the past 3-4 hours.  Her symptoms resolved with Ativan IV in the emergency room.  Per neurology recommendation, patient's symptoms could have been related to partial seizure.   She denies any other neurologic deficits other than that the above-mentioned right facial droop, slurred speech and right upper extremity weakness, s/p recent stroke.  No fever, chills, no loss of consciousness, no altered mental status. Blood test done emergency room are notable for elevated creatinine level at 10.14 due to end-stage renal disease. CT of the head without contrast shows chronic right frontal and left occipital as well as bilateral tiny cerebellar infarcts.  There is atrophy with  chronic small vessel ischemia. No acute intracranial abnormality.  Patient will be admitted for further evaluation and treatment.   VITAL SIGNS:  Blood pressure 124/62, pulse 99, temperature 97.9 F (36.6 C), temperature source Oral, resp. rate 18, height 5\' 7"  (1.702 m), weight 120.2 kg, SpO2 97 %.  I/O:  No intake or output data in the 24 hours ending 03/20/18 2045  PHYSICAL EXAMINATION:  GENERAL:  67 y.o.-year-old patient lying in the bed with no acute distress.  EYES: Pupils equal, round, reactive to light and accommodation. No scleral icterus. Extraocular muscles intact.  HEENT: Head atraumatic, normocephalic. Oropharynx and nasopharynx clear.  NECK:  Supple, no jugular venous distention. No thyroid enlargement, no tenderness.  LUNGS: Normal breath sounds bilaterally, no wheezing, rales,rhonchi or crepitation. No use of accessory muscles of respiration.   CARDIOVASCULAR: S1, S2 normal. No murmurs, rubs, or gallops.  ABDOMEN: Soft, non-tender, non-distended. Bowel sounds present. No organomegaly or mass.  EXTREMITIES: No pedal edema, cyanosis, or clubbing.  NEUROLOGIC: Cranial nerves II through XII are intact. Muscle strength 5/5 in all extremities. Sensation intact. Gait not checked.  PSYCHIATRIC: The patient is alert and oriented x 3.  SKIN: No obvious rash, lesion, or ulcer.   DATA REVIEW:   CBC Recent Labs  Lab 03/14/18 0318  WBC 7.1  HGB 12.0  HCT 36.6  PLT 191    Chemistries  Recent Labs  Lab 03/13/18 2203 03/14/18 0318  NA 139 141  K 4.4 4.2  CL 95* 100  CO2 27 27  GLUCOSE 178* 160*  BUN 48* 55*  CREATININE 10.14* 10.44*  CALCIUM 8.1* 7.8*  AST 18  --   ALT 12  --   ALKPHOS 100  --   BILITOT 0.7  --     Cardiac Enzymes No results for input(s): TROPONINI in the last 168 hours.  Microbiology Results  Results for orders placed or performed during the hospital encounter of 03/07/18  MRSA PCR Screening     Status: None   Collection Time: 03/07/18 11:37 PM  Result Value Ref Range Status   MRSA by PCR NEGATIVE NEGATIVE Final    Comment:        The GeneXpert MRSA Assay (FDA approved for NASAL specimens only), is one component of a comprehensive MRSA colonization surveillance program. It is not intended to diagnose MRSA infection nor to guide or monitor treatment for MRSA infections. Performed at [facility], [address], [place], [place] [address]     RADIOLOGY:  No results found.  EKG:   Orders placed or performed during the hospital encounter of 03/13/18  . ED EKG  . ED EKG      Management plans discussed with the patient, family and they are in agreement.  CODE STATUS:  Code Status History    Date Active Date Inactive Code Status Order ID Comments User Context   03/14/2018 0141 03/15/2018 0336 Full Code [id number]  Cynthia Jo, MD Inpatient   03/08/2018 0005 03/09/2018 2108  Full Code [id number]  Vaughan Basta, MD Inpatient   11/15/2017 0017 11/17/2017 1809 Full Code [id number]  Lance Coon, MD ED   11/12/2017 2219 11/13/2017 1935 Full Code [id number]  Lance Coon, MD ED   04/14/2016 1330 04/14/2016 1700 Full Code [id number]  Bettey Costa, MD ED   08/08/2015 2257 08/09/2015 2054 Full Code [id number]  Hower, Aaron Mose, MD ED   04/17/2015 1334 04/17/2015 1836 Full Code [id number]  Dew, Erskine Squibb, MD Inpatient      TOTAL TIME  TAKING CARE OF THIS PATIENT: 35 minutes.    Vaughan Basta M.D on 03/20/2018 at 8:45 PM  Between 7am to 6pm - Pager - [phone]  After 6pm go to [email or web address] - password [email or web address] [facility]  Office  [phone]  CC: Primary care physician; Cynthia Bandy, MD   Note: This dictation was prepared with Dragon dictation along with smaller phrase technology. Any transcriptional errors that result from this process are unintentional.

## 2018-03-27 ENCOUNTER — Ambulatory Visit (INDEPENDENT_AMBULATORY_CARE_PROVIDER_SITE_OTHER): Payer: Medicare HMO | Admitting: *Deleted

## 2018-03-27 DIAGNOSIS — I6310 Cerebral infarction due to embolism of unspecified precerebral artery: Secondary | ICD-10-CM

## 2018-03-27 NOTE — Progress Notes (Signed)
[facility]link Summary Report / Loop Recorder 

## 2018-04-03 LAB — CUP PACEART REMOTE DEVICE CHECK
Date Time Interrogation Session: 20190730141054
MDC IDC PG IMPLANT DT: 20190425

## 2018-04-20 LAB — CUP PACEART REMOTE DEVICE CHECK
Date Time Interrogation Session: 20190901144010
Implantable Pulse Generator Implant Date: 20190425

## 2018-04-27 ENCOUNTER — Ambulatory Visit (INDEPENDENT_AMBULATORY_CARE_PROVIDER_SITE_OTHER): Payer: Medicare HMO | Admitting: *Deleted

## 2018-04-27 DIAGNOSIS — I6310 Cerebral infarction due to embolism of unspecified precerebral artery: Secondary | ICD-10-CM

## 2018-04-28 NOTE — Progress Notes (Signed)
[facility]link Summary Report / Loop Recorder 

## 2018-05-01 DIAGNOSIS — I69320 Aphasia following cerebral infarction: Secondary | ICD-10-CM | POA: Insufficient documentation

## 2018-05-01 LAB — CUP PACEART REMOTE DEVICE CHECK
Date Time Interrogation Session: 20191004143645
Implantable Pulse Generator Implant Date: 20190425

## 2018-05-29 ENCOUNTER — Inpatient Hospital Stay: Payer: Medicare HMO

## 2018-05-29 ENCOUNTER — Encounter: Payer: Self-pay | Admitting: Emergency Medicine

## 2018-05-29 ENCOUNTER — Other Ambulatory Visit: Payer: Self-pay

## 2018-05-29 ENCOUNTER — Emergency Department: Payer: Medicare HMO

## 2018-05-29 ENCOUNTER — Observation Stay
Admission: EM | Admit: 2018-05-29 | Discharge: 2018-05-30 | Disposition: A | Discharge status: Home / Self Care | Payer: Medicare HMO | Attending: Internal Medicine | Admitting: Internal Medicine

## 2018-05-29 DIAGNOSIS — Z7902 Long term (current) use of antithrombotics/antiplatelets: Secondary | ICD-10-CM | POA: Insufficient documentation

## 2018-05-29 DIAGNOSIS — R059 Cough, unspecified: Secondary | ICD-10-CM

## 2018-05-29 DIAGNOSIS — R05 Cough: Secondary | ICD-10-CM

## 2018-05-29 DIAGNOSIS — E785 Hyperlipidemia, unspecified: Secondary | ICD-10-CM | POA: Diagnosis not present

## 2018-05-29 DIAGNOSIS — I120 Hypertensive chronic kidney disease with stage 5 chronic kidney disease or end stage renal disease: Secondary | ICD-10-CM | POA: Diagnosis not present

## 2018-05-29 DIAGNOSIS — Z8673 Personal history of transient ischemic attack (TIA), and cerebral infarction without residual deficits: Secondary | ICD-10-CM | POA: Diagnosis not present

## 2018-05-29 DIAGNOSIS — E1122 Type 2 diabetes mellitus with diabetic chronic kidney disease: Secondary | ICD-10-CM | POA: Diagnosis not present

## 2018-05-29 DIAGNOSIS — N186 End stage renal disease: Secondary | ICD-10-CM | POA: Diagnosis not present

## 2018-05-29 DIAGNOSIS — Z992 Dependence on renal dialysis: Secondary | ICD-10-CM | POA: Insufficient documentation

## 2018-05-29 DIAGNOSIS — I2510 Atherosclerotic heart disease of native coronary artery without angina pectoris: Secondary | ICD-10-CM | POA: Insufficient documentation

## 2018-05-29 DIAGNOSIS — J449 Chronic obstructive pulmonary disease, unspecified: Secondary | ICD-10-CM | POA: Insufficient documentation

## 2018-05-29 DIAGNOSIS — R9431 Abnormal electrocardiogram [ECG] [EKG]: Secondary | ICD-10-CM

## 2018-05-29 DIAGNOSIS — R112 Nausea with vomiting, unspecified: Secondary | ICD-10-CM

## 2018-05-29 DIAGNOSIS — Z794 Long term (current) use of insulin: Secondary | ICD-10-CM | POA: Diagnosis not present

## 2018-05-29 DIAGNOSIS — R531 Weakness: Secondary | ICD-10-CM | POA: Diagnosis not present

## 2018-05-29 DIAGNOSIS — R51 Headache: Secondary | ICD-10-CM | POA: Diagnosis not present

## 2018-05-29 DIAGNOSIS — R519 Headache, unspecified: Secondary | ICD-10-CM

## 2018-05-29 DIAGNOSIS — Z87891 Personal history of nicotine dependence: Secondary | ICD-10-CM | POA: Insufficient documentation

## 2018-05-29 HISTORY — DX: Nausea with vomiting, unspecified: R11.2

## 2018-05-29 LAB — GLUCOSE, CAPILLARY
Glucose-Capillary: 132 mg/dL — ABNORMAL HIGH (ref 70–99)
Glucose-Capillary: 129 mg/dL — ABNORMAL HIGH (ref 70–99)

## 2018-05-29 LAB — MRSA PCR SCREENING: MRSA by PCR: NEGATIVE

## 2018-05-29 LAB — BASIC METABOLIC PANEL
Anion gap: 14 (ref 5–15)
BUN: 39 mg/dL — ABNORMAL HIGH (ref 8–23)
CHLORIDE: 99 mmol/L (ref 98–111)
CO2: 27 mmol/L (ref 22–32)
CREATININE: 8.36 mg/dL — AB (ref 0.44–1.00)
Calcium: 9.4 mg/dL (ref 8.9–10.3)
GFR calc non Af Amer: 4 mL/min — ABNORMAL LOW (ref >60)
GFR, EST AFRICAN AMERICAN: 5 mL/min — AB (ref >60)
Glucose, Bld: 176 mg/dL — ABNORMAL HIGH (ref 70–99)
POTASSIUM: 4.5 mmol/L (ref 3.5–5.1)
SODIUM: 140 mmol/L (ref 135–145)

## 2018-05-29 LAB — CBC
HEMATOCRIT: 42.6 % (ref 36.0–46.0)
Hemoglobin: 13.5 g/dL (ref 12.0–15.0)
MCH: 27.6 pg (ref 26.0–34.0)
MCHC: 31.7 g/dL (ref 30.0–36.0)
MCV: 87.1 fL (ref 80.0–100.0)
NRBC: 0 % (ref 0.0–0.2)
PLATELETS: 194 10*3/uL (ref 150–400)
RBC: 4.89 MIL/uL (ref 3.87–5.11)
RDW: 13.9 % (ref 11.5–15.5)
WBC: 8.9 10*3/uL (ref 4.0–10.5)

## 2018-05-29 LAB — TROPONIN I: Troponin I: <0.03 ng/mL (ref <0.03)

## 2018-05-29 MED ORDER — POLYETHYLENE GLYCOL 3350 17 G PO PACK: 17.0000 g | PACK | Freq: Every day | ORAL | Status: DC | PRN

## 2018-05-29 MED ORDER — PANTOPRAZOLE SODIUM 40 MG PO TBEC
40.0000 mg | DELAYED_RELEASE_TABLET | Freq: Two times a day (BID) | ORAL | Status: DC
  Administered 2018-05-30 (×2): 40 mg via ORAL
  Filled 2018-05-29 (×2): qty 1

## 2018-05-29 MED ORDER — ONDANSETRON HCL 4 MG/2ML IJ SOLN: 4.0000 mg | Freq: Four times a day (QID) | INTRAMUSCULAR | Status: DC | PRN

## 2018-05-29 MED ORDER — TETRACAINE HCL 0.5 % OP SOLN
1.0000 [drp] | Freq: Once | OPHTHALMIC | Status: AC
  Administered 2018-05-29: 1 [drp] via OPHTHALMIC
  Filled 2018-05-29: qty 4

## 2018-05-29 MED ORDER — MAGNESIUM SULFATE 2 GM/50ML IV SOLN
2.0000 g | Freq: Once | INTRAVENOUS | Status: AC
  Administered 2018-05-29: 2 g via INTRAVENOUS
  Filled 2018-05-29: qty 50

## 2018-05-29 MED ORDER — GABAPENTIN 100 MG PO CAPS
100.0000 mg | ORAL_CAPSULE | Freq: Three times a day (TID) | ORAL | Status: DC
  Administered 2018-05-29 – 2018-05-30 (×3): 100 mg via ORAL
  Filled 2018-05-29 (×3): qty 1

## 2018-05-29 MED ORDER — HEPARIN SODIUM (PORCINE) 5000 UNIT/ML IJ SOLN
5000.0000 [IU] | Freq: Three times a day (TID) | INTRAMUSCULAR | Status: DC
  Administered 2018-05-29: 5000 [IU] via SUBCUTANEOUS
  Filled 2018-05-29: qty 1

## 2018-05-29 MED ORDER — ONDANSETRON HCL 4 MG/2ML IJ SOLN
4.0000 mg | Freq: Once | INTRAMUSCULAR | Status: AC
  Administered 2018-05-29: 4 mg via INTRAVENOUS
  Filled 2018-05-29: qty 2

## 2018-05-29 MED ORDER — LORAZEPAM 2 MG/ML IJ SOLN
0.5000 mg | Freq: Once | INTRAMUSCULAR | Status: AC
  Administered 2018-05-29: 0.5 mg via INTRAVENOUS
  Filled 2018-05-29: qty 1

## 2018-05-29 MED ORDER — CLOPIDOGREL BISULFATE 75 MG PO TABS
75.0000 mg | ORAL_TABLET | Freq: Every day | ORAL | Status: DC
  Administered 2018-05-30: 75 mg via ORAL
  Filled 2018-05-29: qty 1

## 2018-05-29 MED ORDER — KETOROLAC TROMETHAMINE 30 MG/ML IJ SOLN
15.0000 mg | Freq: Once | INTRAMUSCULAR | Status: AC
  Administered 2018-05-29: 15 mg via INTRAVENOUS
  Filled 2018-05-29: qty 1

## 2018-05-29 MED ORDER — IPRATROPIUM-ALBUTEROL 0.5-2.5 (3) MG/3ML IN SOLN: 3.0000 mL | Freq: Four times a day (QID) | RESPIRATORY_TRACT | Status: DC | PRN

## 2018-05-29 MED ORDER — LEVETIRACETAM 500 MG PO TABS
1000.0000 mg | ORAL_TABLET | Freq: Every day | ORAL | Status: DC
  Administered 2018-05-30: 1000 mg via ORAL
  Filled 2018-05-29: qty 2

## 2018-05-29 MED ORDER — INSULIN ASPART PROT & ASPART (70-30 MIX) 100 UNIT/ML ~~LOC~~ SUSP
10.0000 [IU] | Freq: Two times a day (BID) | SUBCUTANEOUS | Status: DC
  Administered 2018-05-30: 10 [IU] via SUBCUTANEOUS
  Filled 2018-05-29: qty 10

## 2018-05-29 MED ORDER — METOPROLOL SUCCINATE ER 100 MG PO TB24
100.0000 mg | ORAL_TABLET | Freq: Every evening | ORAL | Status: DC
  Administered 2018-05-30: 100 mg via ORAL
  Filled 2018-05-29: qty 1

## 2018-05-29 MED ORDER — LOSARTAN POTASSIUM 50 MG PO TABS
100.0000 mg | ORAL_TABLET | Freq: Every day | ORAL | Status: DC
  Administered 2018-05-30: 100 mg via ORAL
  Filled 2018-05-29: qty 2

## 2018-05-29 MED ORDER — LIDOCAINE-PRILOCAINE 2.5-2.5 % EX CREA
1.0000 "application " | TOPICAL_CREAM | CUTANEOUS | Status: DC | PRN
  Filled 2018-05-29: qty 5

## 2018-05-29 MED ORDER — ATORVASTATIN CALCIUM 20 MG PO TABS
80.0000 mg | ORAL_TABLET | Freq: Every evening | ORAL | Status: DC
  Administered 2018-05-29 – 2018-05-30 (×2): 80 mg via ORAL
  Filled 2018-05-29 (×2): qty 4

## 2018-05-29 MED ORDER — LEVETIRACETAM 250 MG PO TABS
250.0000 mg | ORAL_TABLET | ORAL | Status: DC
  Administered 2018-05-30: 250 mg via ORAL
  Filled 2018-05-29: qty 1

## 2018-05-29 MED ORDER — ACETAMINOPHEN 500 MG PO TABS
1000.0000 mg | ORAL_TABLET | Freq: Once | ORAL | Status: AC
  Administered 2018-05-29: 1000 mg via ORAL
  Filled 2018-05-29: qty 2

## 2018-05-29 MED ORDER — CINACALCET HCL 30 MG PO TABS
60.0000 mg | ORAL_TABLET | Freq: Every day | ORAL | Status: DC
  Administered 2018-05-30: 60 mg via ORAL
  Filled 2018-05-29: qty 2

## 2018-05-29 MED ORDER — MORPHINE SULFATE (PF) 4 MG/ML IV SOLN
4.0000 mg | Freq: Once | INTRAVENOUS | Status: AC
  Administered 2018-05-29: 4 mg via INTRAVENOUS
  Filled 2018-05-29: qty 1

## 2018-05-29 MED ORDER — METOCLOPRAMIDE HCL 5 MG/ML IJ SOLN
10.0000 mg | Freq: Once | INTRAMUSCULAR | Status: AC
  Administered 2018-05-29: 10 mg via INTRAVENOUS
  Filled 2018-05-29: qty 2

## 2018-05-29 MED ORDER — ACETAMINOPHEN 650 MG RE SUPP: 650.0000 mg | Freq: Four times a day (QID) | RECTAL | Status: DC | PRN

## 2018-05-29 MED ORDER — CALCIUM ACETATE (PHOS BINDER) 667 MG PO CAPS
2001.0000 mg | ORAL_CAPSULE | Freq: Three times a day (TID) | ORAL | Status: DC
  Administered 2018-05-30 (×2): 2001 mg via ORAL
  Filled 2018-05-29 (×2): qty 3

## 2018-05-29 MED ORDER — ACETAMINOPHEN 325 MG PO TABS: 650.0000 mg | ORAL_TABLET | Freq: Four times a day (QID) | ORAL | Status: DC | PRN

## 2018-05-29 MED ORDER — METOCLOPRAMIDE HCL 5 MG/ML IJ SOLN
5.0000 mg | Freq: Three times a day (TID) | INTRAMUSCULAR | Status: DC
  Administered 2018-05-29 – 2018-05-30 (×2): 5 mg via INTRAVENOUS
  Filled 2018-05-29 (×2): qty 2

## 2018-05-29 MED ORDER — ONDANSETRON HCL 4 MG PO TABS: 4.0000 mg | ORAL_TABLET | Freq: Four times a day (QID) | ORAL | Status: DC | PRN

## 2018-05-29 NOTE — ED Provider Notes (Addendum)
[facility] Emergency Department Provider Note  ____________________________________________  Time seen: Approximately 10:52 AM  I have reviewed the triage vital signs and the nursing notes.   HISTORY  Chief Complaint Weakness   HPI Cynthia Dean is a 67 y.o. female with a history of CVA with slurred speech, COPD, CAD, diabetes, ESRD on HD (MWF), hypertension, hyperlipidemia who presents for evaluation of headache.  Patient reports that the headache started yesterday after dialysis.  She denies having frequent headaches.  Headaches stabbing, initially mild but got progressively worse over several hours, located on the right side of her head, constant and nonradiating.  No changes in vision, no worsening slurred speech, no facial droop, no vertigo, no difficulty walking, no unilateral weakness or numbness, no chest pain or shortness of breath.  Patient does have tearing of her right eye associated with it.  No fever or chills, no neck stiffness.  Past Medical History:  Diagnosis Date  . COPD (chronic obstructive pulmonary disease) ([place])   . Coronary artery disease   . Diabetes mellitus without complication ([place])   . ESRD on dialysis ([place])   . Heart murmur   . Hyperlipidemia   . Hypertension   . Stroke [facility])     Patient Active Problem List   Diagnosis Date Noted  . Partial seizure ([place]) 03/14/2018  . Stroke ([place]) 03/07/2018  . Slurred speech 11/14/2017  . Diabetes ([place]) 11/12/2017  . CAD (coronary artery disease) 11/12/2017  . COPD (chronic obstructive pulmonary disease) ([place]) 11/12/2017  . ESRD on dialysis ([place]) 11/12/2017  . Hematemesis 06/27/2017  . CVA (cerebral vascular accident) ([place]) 04/14/2016  . Essential hypertension, benign 11/27/2015  . Hyperlipidemia 11/27/2015  . Obesity 11/27/2015  . Prolonged Q-T interval on ECG 11/26/2015  . Intractable nausea and vomiting 08/08/2015    Past Surgical History:  Procedure Laterality Date   . A/V FISTULAGRAM Left 12/20/2016   Procedure: A/V Fistulagram;  Surgeon: Katha Cabal, MD;  Location: [place] CV LAB;  Service: Cardiovascular;  Laterality: Left;  . A/V SHUNT INTERVENTION N/A 12/20/2016   Procedure: A/V Shunt Intervention;  Surgeon: Katha Cabal, MD;  Location: [place] CV LAB;  Service: Cardiovascular;  Laterality: N/A;  . A/V SHUNTOGRAM Left 09/11/2017   Procedure: A/V SHUNTOGRAM;  Surgeon: Algernon Huxley, MD;  Location: [place] CV LAB;  Service: Cardiovascular;  Laterality: Left;  . BREAST BIOPSY Bilateral 07/19/00   neg  . LOOP RECORDER INSERTION N/A 11/16/2017   Procedure: LOOP RECORDER INSERTION;  Surgeon: Deboraha Sprang, MD;  Location: [place] CV LAB;  Service: Cardiovascular;  Laterality: N/A;  . PERIPHERAL VASCULAR CATHETERIZATION Left 02/02/2015   Procedure: A/V Shuntogram/Fistulagram;  Surgeon: Algernon Huxley, MD;  Location: [place] CV LAB;  Service: Cardiovascular;  Laterality: Left;  . PERIPHERAL VASCULAR CATHETERIZATION Left 02/02/2015   Procedure: A/V Shunt Intervention;  Surgeon: Algernon Huxley, MD;  Location: [place] CV LAB;  Service: Cardiovascular;  Laterality: Left;  . PERIPHERAL VASCULAR CATHETERIZATION Left 03/09/2015   Procedure: A/V Shuntogram/Fistulagram;  Surgeon: Algernon Huxley, MD;  Location: [place] CV LAB;  Service: Cardiovascular;  Laterality: Left;  . PERIPHERAL VASCULAR CATHETERIZATION N/A 03/09/2015   Procedure: A/V Shunt Intervention;  Surgeon: Algernon Huxley, MD;  Location: [place] CV LAB;  Service: Cardiovascular;  Laterality: N/A;  . PERIPHERAL VASCULAR CATHETERIZATION Left 04/17/2015   Procedure: Upper Extremity Angiography;  Surgeon: Algernon Huxley, MD;  Location: [place] CV LAB;  Service: Cardiovascular;  Laterality: Left;  . PERIPHERAL VASCULAR CATHETERIZATION  04/17/2015   Procedure: Upper Extremity Intervention;  Surgeon: Algernon Huxley, MD;  Location: [place] CV LAB;  Service:  Cardiovascular;;  . PERIPHERAL VASCULAR CATHETERIZATION N/A 08/10/2015   Procedure: A/V Shuntogram/Fistulagram;  Surgeon: Algernon Huxley, MD;  Location: [place] CV LAB;  Service: Cardiovascular;  Laterality: N/A;  . PERIPHERAL VASCULAR CATHETERIZATION N/A 08/10/2015   Procedure: A/V Shunt Intervention;  Surgeon: Algernon Huxley, MD;  Location: [place] CV LAB;  Service: Cardiovascular;  Laterality: N/A;  . PERIPHERAL VASCULAR CATHETERIZATION Left 04/11/2016   Procedure: A/V Shuntogram/Fistulagram;  Surgeon: Algernon Huxley, MD;  Location: [place] CV LAB;  Service: Cardiovascular;  Laterality: Left;  . TEE WITHOUT CARDIOVERSION N/A 11/16/2017   Procedure: TRANSESOPHAGEAL ECHOCARDIOGRAM (TEE);  Surgeon: Minna Merritts, MD;  Location: [facility] ORS;  Service: Cardiovascular;  Laterality: N/A;    Prior to Admission medications   Medication Sig Start Date End Date Taking? Authorizing Provider  acetaminophen (TYLENOL) 325 MG tablet Take 2 tablets (650 mg total) by mouth every 4 (four) hours as needed for mild pain (or temp > 37.5 C (99.5 F)). 11/17/17  Yes Gouru, Illene Silver, MD  albuterol-ipratropium (COMBIVENT) 18-103 MCG/ACT inhaler Inhale 2 puffs into the lungs 2 (two) times daily.   Yes [provider]  calcium acetate (PHOSLO) 667 MG capsule Take 2,001 mg by mouth 3 (three) times daily with meals.    Yes [provider]  cinacalcet (SENSIPAR) 60 MG tablet Take 60 mg by mouth daily.   Yes [provider]  clopidogrel (PLAVIX) 75 MG tablet TAKE ONE TABLET BY MOUTH ONCE DAILY Patient taking differently: Take 75 mg by mouth daily.  06/14/16  Yes Stegmayer, Joelene Millin A, PA-C  gabapentin (NEURONTIN) 100 MG capsule Take 100 mg by mouth 3 (three) times daily.   Yes [provider]  insulin NPH-regular Human (NOVOLIN 70/30) (70-30) 100 UNIT/ML injection Inject 25 Units into the skin 2 (two) times daily with a meal. Except dialysis days. Do not take on Tuesdays, Thursdays, and  Saturdays. 11/17/17  Yes Gouru, Illene Silver, MD  levETIRAcetam (KEPPRA) 1000 MG tablet Take 1 tablet (1,000 mg total) by mouth daily. 03/15/18  Yes Vaughan Basta, MD  levETIRAcetam (KEPPRA) 250 MG tablet Take 1 tablet (250 mg total) by mouth 3 (three) times a week. Take after dialysis on Monday, wed and Friday in addition to regular dose. 03/14/18  Yes Vaughan Basta, MD  lidocaine-prilocaine (EMLA) cream Apply 1 application topically as needed (for port access).    Yes [provider]  losartan (COZAAR) 100 MG tablet Take 100 mg by mouth daily. 09/12/17  Yes [provider]  metoprolol succinate (TOPROL-XL) 100 MG 24 hr tablet Take 100 mg by mouth every evening. Take with or immediately following a meal.    Yes [provider]  omeprazole (PRILOSEC) 20 MG capsule Take 20 mg by mouth 2 (two) times daily.    Yes [provider]  polyethylene glycol (MIRALAX / GLYCOLAX) packet Take 17 g by mouth daily as needed for moderate constipation. 11/17/17  Yes Gouru, Illene Silver, MD  torsemide (DEMADEX) 100 MG tablet Take 100 mg by mouth daily. 10/10/17  Yes [provider]  atorvastatin (LIPITOR) 80 MG tablet Take 80 mg by mouth every evening.     [provider]  budesonide-formoterol (SYMBICORT) 160-4.5 MCG/ACT inhaler Inhale 2 puffs into the lungs 2 (two) times daily. Patient not taking: Reported on 05/29/2018 07/01/17   Loletha Grayer,  MD  ipratropium-albuterol (DUONEB) 0.5-2.5 (3) MG/3ML SOLN Take 3 mLs by nebulization every 6 (six) hours as needed (for shortness of breath/ wheezing).     [provider]    Allergies Tape  Family History  Problem Relation Age of Onset  . Breast cancer Father 103  . Stroke Mother   . Heart attack Mother   . Heart Problems Sister   . Breast cancer Cousin 74       1 st cousin. Maternal     Social History Social History   Tobacco Use  . Smoking status: Former Smoker    Packs/day: 0.00    Types:  Cigarettes  . Smokeless tobacco: Never Used  Substance Use Topics  . Alcohol use: No  . Drug use: No    Review of Systems  Constitutional: Negative for fever. Eyes: Negative for visual changes. ENT: Negative for sore throat. Neck: No neck pain  Cardiovascular: Negative for chest pain. Respiratory: Negative for shortness of breath. Gastrointestinal: Negative for abdominal pain, vomiting or diarrhea. Genitourinary: Negative for dysuria. Musculoskeletal: Negative for back pain. Skin: Negative for rash. Neurological: Negative for  weakness or numbness. + HA Psych: No SI or HI  ____________________________________________   PHYSICAL EXAM:  VITAL SIGNS: ED Triage Vitals  Enc Vitals Group     BP 05/29/18 1001 (!) 178/91     Pulse Rate 05/29/18 1001 62     Resp 05/29/18 1001 18     Temp 05/29/18 1001 97.9 F (36.6 C)     Temp Source 05/29/18 1001 Oral     SpO2 05/29/18 1001 96 %     Weight 05/29/18 1002 264 lb 8.8 oz (120 kg)     Height 05/29/18 1002 5\' 8"  (1.727 m)     Head Circumference --      Peak Flow --      Pain Score 05/29/18 1001 10     Pain Loc --      Pain Edu? --      Excl. in [place]? --     Constitutional: Alert and oriented. Well appearing and in no apparent distress. HEENT:      Head: Normocephalic and atraumatic.         Eyes: Conjunctivae are normal. Sclera is non-icteric. R eye tearing. PERRL, EOMI. IOP 16 on the R and 19 on the L eye      Mouth/Throat: Mucous membranes are moist.       Neck: Supple with no signs of meningismus. Cardiovascular: Regular rate and rhythm. No murmurs, gallops, or rubs. 2+ symmetrical distal pulses are present in all extremities. No JVD. Respiratory: Normal respiratory effort. Lungs are clear to auscultation bilaterally. No wheezes, crackles, or rhonchi.  Gastrointestinal: Soft, non tender, and non distended with positive bowel sounds. No rebound or guarding. Musculoskeletal: Nontender with normal range of motion in all  extremities. No edema, cyanosis, or erythema of extremities. Neurologic: Mild slurred speech, slight asymmetric smile (baseline per family). A & O x3, PERRL, EOMI, no nystagmus, CN II-XII intact, motor testing reveals good tone and bulk throughout. There is no evidence of pronator drift or dysmetria. Muscle strength is 5/5 throughout. Sensory examination is intact. Gait is normal. Skin: Skin is warm, dry and intact. No rash noted. Psychiatric: Mood and affect are normal. Speech and behavior are normal.  ____________________________________________   LABS (all labs ordered are listed, but only abnormal results are displayed)  Labs Reviewed  BASIC METABOLIC PANEL - Abnormal; Notable for the following components:  Result Value   Glucose, Bld 176 (*)    BUN 39 (*)    Creatinine, Ser 8.36 (*)    GFR calc non Af Amer 4 (*)    GFR calc Af Amer 5 (*)    All other components within normal limits  CBC  TROPONIN I  TROPONIN I  CBG MONITORING, ED   ____________________________________________  EKG  ED ECG REPORT I, Rudene Re, the attending physician, personally viewed and interpreted this ECG.  Normal sinus rhythm, rate of 64, ST depressions with T wave inversions in inferior leads, no ST elevation, prolonged QTC, normal axis. New when compared to prior ____________________________________________  RADIOLOGY  I have personally reviewed the images performed during this visit and I agree with the Radiologist's read.   Interpretation by Radiologist:  Ct Head Wo Contrast  Result Date: 05/29/2018 CLINICAL DATA:  Headache. Patient reports progressive right-sided weakness after receiving dialysis yesterday. Normal neurologic exam. EXAM: CT HEAD WITHOUT CONTRAST TECHNIQUE: Contiguous axial images were obtained from the base of the skull through the vertex without intravenous contrast. COMPARISON:  03/13/2018 FINDINGS: Brain: No evidence of acute infarction, hemorrhage,  hydrocephalus, extra-axial collection or mass lesion/mass effect. Encephalomalacia in the right frontal lobe and left posterior parietal lobe identified compatible with chronic infarcts. There is mild diffuse low-attenuation within the subcortical and periventricular white matter compatible with chronic microvascular disease. Vascular: No hyperdense vessel or unexpected calcification. Skull: Normal. Negative for fracture or focal lesion. Sinuses/Orbits: No acute finding. Other: None. IMPRESSION: 1. No acute intracranial abnormalities. 2. 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 3. Chronic small vessel ischemic change. Electronically Signed   By: Kerby Moors M.D.   On: 05/29/2018 11:01      ____________________________________________   PROCEDURES  Procedure(s) performed: None Procedures Critical Care performed:  None ____________________________________________   INITIAL IMPRESSION / ASSESSMENT AND PLAN / ED COURSE  67 y.o. female with a history of CVA with slurred speech, COPD, CAD, diabetes, ESRD on HD (MWF), hypertension, hyperlipidemia who presents for evaluation of headache.  Patient has a stabbing right-sided headache with some tearing of the right eye.  Otherwise she is neurologically intact with mild deficits which are chronic from prior stroke.  Patient is on Plavix.  Head CT will be done to rule out intracranial bleed.  No thunderclap headache therefore subarachnoid hemorrhage less likely on my differential diagnosis. Cluster HA possible with tearing. Will get IOP to rule out acute close angle glaucoma although no injected conjunctiva, EOMI, and PERRL.  1:30 PM on 05/29/2018 -----------------------------------------  Head CT negative. IOP WNL. HA has resolved with medications and O2. Patient remains well-appearing and neurologically intact.  Labs showing no acute findings. EKG showing ST depressions which were new, most likely due to demand ischemia in the  setting of elevated BP. Patient remains with no CP and has had 2 troponins which are negative. Will refer to cardiology for further evaluation.  Discussed standard return precautions and close follow-up with primary care doctor.   Clinical Course as of May 29 1510  Tue May 29, 2018  1509 Patient started to vomit again, after 2 rounds of anti-emetics. Will admit for intractable vomiting and get an MRI.   [CV]    Clinical Course User Index [CV] Alfred Levins [place], MD   _________________________   As part of my medical decision making, I reviewed the following data within the [facility] notes reviewed and incorporated, Labs reviewed , EKG interpreted , Old EKG reviewed, Old  chart reviewed, Radiograph reviewed , Notes from prior ED visits and [place] Controlled Substance Database    Pertinent labs & imaging results that were available during my care of the patient were reviewed by me and considered in my medical decision making (see chart for details).    ____________________________________________   FINAL CLINICAL IMPRESSION(S) / ED DIAGNOSES  Final diagnoses:  Acute nonintractable headache, unspecified headache type  Abnormal EKG  Intractable vomiting with nausea, unspecified vomiting type      NEW MEDICATIONS STARTED DURING THIS VISIT:  ED Discharge Orders         [place]     05/29/18 1444    Face-to-face encounter (required for Medicare/Medicaid patients)    Comments:  I [place] certify that this patient is under my care and that I, or a nurse practitioner or physician's assistant working with me, had a face-to-face encounter that meets the physician face-to-face encounter requirements with this patient on 05/29/2018. The encounter with the patient was in whole, or in part for the following medical condition(s) which is the primary reason for home health care (List medical condition): stroke patient with decreased mobility and  balance   05/29/18 1444           Note:  This document was prepared using Dragon voice recognition software and may include unintentional dictation errors.    Rudene Re, MD 05/29/18 [place], [employer], MD 05/29/18 [phone]

## 2018-05-29 NOTE — Progress Notes (Signed)
Patient ID: Cynthia Dean, female   DOB: Sep 09, 1950, 67 y.o.   MRN: [id number]  ACP note Patient and husband present  Diagnosis: Persistent nausea and vomiting and headache, end-stage renal disease, history of stroke, hyperlipidemia, essential hypertension, type 2 diabetes, COPD  CODE STATUS discussed and patient is a full code.  Plan.  Try to settle down nausea vomiting with PRN medications and Reglan.  MRI of the brain to rule out acute stroke.  Try to settle down headache with a dose of Toradol and magnesium.  End-stage renal disease for dialysis for tomorrow  Time spent on ACP discussion 17 minutes Dr. Loletha Grayer

## 2018-05-29 NOTE — ED Notes (Signed)
This RN went to discharge pt. IV removed. Pt got dressed. Came back with wheelchair and pt was vomiting. MD notified. PT will be admitted.

## 2018-05-29 NOTE — ED Notes (Signed)
Pt leaving for MRI.  

## 2018-05-29 NOTE — ED Notes (Signed)
Pt off unit at MRI

## 2018-05-29 NOTE — Care Management Note (Addendum)
Case Management Note  Patient Details  Name: Cynthia Dean MRN: [id number] Date of Birth: 1950/07/28  Subjective/Objective:      Patient is being seen in the ED for worsening weakness and headache after dialysis yesterday.  Patient has MWF dialysis.  She uses [facility] for transportation back and forth and has no barriers to getting to dialysis.  Patient lives with her finance.  She has no equipment in the home other than a shower chair.  Patient is able to complete ADL's independently.   Patient reports that she oxygen at home as needed but cannot remember the name of the company it comes from- she thinks it is [employer].   [facility]- patient thinks that she will benefit from home health again.  Home health ordered for PT [facility] Flock with [facility] and will set it up.  Patient is being discharged from the ED with [facility].    Doran Clay RN, BSN [phone]                 Action/Plan:   Expected Discharge Date:                  Expected Discharge Plan:  [place]  In-House Referral:     Discharge planning Services  CM Consult  Post Acute Care Choice:  Home Health Choice offered to:     DME Arranged:    DME Agency:     HH Arranged:  PT [place]:  [place]  Status of Service:  Completed, signed off  If discussed at [place] of Stay Meetings, dates discussed:    Additional Comments:  Shelbie Hutching, RN 05/29/2018, 3:09 PM

## 2018-05-29 NOTE — Plan of Care (Signed)
  Problem: Clinical Measurements: Goal: Ability to maintain clinical measurements within normal limits will improve Outcome: Progressing Goal: Will remain free from infection Outcome: Progressing Goal: Diagnostic test results will improve Outcome: Progressing Goal: Respiratory complications will improve Outcome: Progressing Goal: Cardiovascular complication will be avoided Outcome: Progressing   Problem: Activity: Goal: Risk for activity intolerance will decrease Outcome: Progressing   Problem: Pain Managment: Goal: General experience of comfort will improve Outcome: Progressing   Problem: Safety: Goal: Ability to remain free from injury will improve Outcome: Progressing   

## 2018-05-29 NOTE — ED Notes (Signed)
MD at bedside speaking with & assessing pt.

## 2018-05-29 NOTE — ED Triage Notes (Signed)
Patient report increased right-sided weakness since receiving dialysis treatment yesterday. Reports baseline weakness on right from previous CVA. Patient states she expected weakness to improve over time, but it has continued into this morning. Patient A&O x4. Equal grip strength and sensation noted. Patient also complaining of headache since dialysis.

## 2018-05-29 NOTE — Discharge Instructions (Addendum)

## 2018-05-29 NOTE — ED Notes (Signed)
Floor nurse notified pt is at MRI and still needs UA/Urine culture.

## 2018-05-29 NOTE — ED Notes (Signed)
Pt is on dialysis, last treatment yesterday. Mon, Wed, Fri

## 2018-05-29 NOTE — H&P (Signed)
[facility] NAME: Cynthia Dean    MR#:  [id number]  DATE OF BIRTH:  [birth year]  DATE OF ADMISSION:  05/29/2018  PRIMARY CARE PHYSICIAN: Lowella Bandy, MD   REQUESTING/REFERRING PHYSICIAN: Dr Gonzella Lex  CHIEF COMPLAINT:   Chief Complaint  Patient presents with  . Weakness    HISTORY OF PRESENT ILLNESS:  Cynthia Dean  is a 67 y.o. female with a known history of end-stage renal disease presents with headache and nausea vomiting starting today.  She is also been shaking and feels cold.  Patient does not complain of any abdominal pain or diarrhea.  No photophobia or neck discomfort.  Patient just not feeling well.  Patient has a history of stroke and is able to speak in one-word answers but does not elaborate much.  Family at the bedside able to speak a little bit more.  Hospitalist services were contacted for further evaluation for persistent nausea vomiting.  PAST MEDICAL HISTORY:   Past Medical History:  Diagnosis Date  . COPD (chronic obstructive pulmonary disease) ([place])   . Coronary artery disease   . Diabetes mellitus without complication ([place])   . ESRD on dialysis ([place])   . Heart murmur   . Hyperlipidemia   . Hypertension   . Stroke [facility])     PAST SURGICAL HISTORY:   Past Surgical History:  Procedure Laterality Date  . A/V FISTULAGRAM Left 12/20/2016   Procedure: A/V Fistulagram;  Surgeon: Katha Cabal, MD;  Location: [place] CV LAB;  Service: Cardiovascular;  Laterality: Left;  . A/V SHUNT INTERVENTION N/A 12/20/2016   Procedure: A/V Shunt Intervention;  Surgeon: Katha Cabal, MD;  Location: [place] CV LAB;  Service: Cardiovascular;  Laterality: N/A;  . A/V SHUNTOGRAM Left 09/11/2017   Procedure: A/V SHUNTOGRAM;  Surgeon: Algernon Huxley, MD;  Location: [place] [facility] LAB;  Service: Cardiovascular;  Laterality: Left;  . BREAST BIOPSY Bilateral 07/19/00   neg  . LOOP  RECORDER INSERTION N/A 11/16/2017   Procedure: LOOP RECORDER INSERTION;  Surgeon: Deboraha Sprang, MD;  Location: [place] CV LAB;  Service: Cardiovascular;  Laterality: N/A;  . PERIPHERAL VASCULAR CATHETERIZATION Left 02/02/2015   Procedure: A/V Shuntogram/Fistulagram;  Surgeon: Algernon Huxley, MD;  Location: [place] CV LAB;  Service: Cardiovascular;  Laterality: Left;  . PERIPHERAL VASCULAR CATHETERIZATION Left 02/02/2015   Procedure: A/V Shunt Intervention;  Surgeon: Algernon Huxley, MD;  Location: [place] [facility] LAB;  Service: Cardiovascular;  Laterality: Left;  . PERIPHERAL VASCULAR CATHETERIZATION Left 03/09/2015   Procedure: A/V Shuntogram/Fistulagram;  Surgeon: Algernon Huxley, MD;  Location: [place] CV LAB;  Service: Cardiovascular;  Laterality: Left;  . PERIPHERAL VASCULAR CATHETERIZATION N/A 03/09/2015   Procedure: A/V Shunt Intervention;  Surgeon: Algernon Huxley, MD;  Location: [place] CV LAB;  Service: Cardiovascular;  Laterality: N/A;  . PERIPHERAL VASCULAR CATHETERIZATION Left 04/17/2015   Procedure: Upper Extremity Angiography;  Surgeon: Algernon Huxley, MD;  Location: [place] CV [facility];  Service: Cardiovascular;  Laterality: Left;  . PERIPHERAL VASCULAR CATHETERIZATION  04/17/2015   Procedure: Upper Extremity Intervention;  Surgeon: Algernon Huxley, MD;  Location: [place] CV LAB;  Service: Cardiovascular;;  . PERIPHERAL VASCULAR CATHETERIZATION N/A 08/10/2015   Procedure: A/V Shuntogram/Fistulagram;  Surgeon: Algernon Huxley, MD;  Location: [place] CV LAB;  Service: Cardiovascular;  Laterality: N/A;  . PERIPHERAL VASCULAR CATHETERIZATION N/A 08/10/2015   Procedure: A/V Shunt Intervention;  Surgeon:  Algernon Huxley, MD;  Location: [place] CV LAB;  Service: Cardiovascular;  Laterality: N/A;  . PERIPHERAL VASCULAR CATHETERIZATION Left 04/11/2016   Procedure: A/V Shuntogram/Fistulagram;  Surgeon: Algernon Huxley, MD;  Location: [place] CV LAB;  Service: Cardiovascular;   Laterality: Left;  . TEE WITHOUT CARDIOVERSION N/A 11/16/2017   Procedure: TRANSESOPHAGEAL ECHOCARDIOGRAM (TEE);  Surgeon: Minna Merritts, MD;  Location: [facility];  Service: Cardiovascular;  Laterality: N/A;    SOCIAL HISTORY:   Social History   Tobacco Use  . Smoking status: Former Smoker    Packs/day: 0.00    Types: Cigarettes  . Smokeless tobacco: Never Used  Substance Use Topics  . Alcohol use: No    FAMILY HISTORY:   Family History  Problem Relation Age of Onset  . Breast cancer Father 34  . Stroke Mother   . Heart attack Mother   . Heart Problems Sister   . Breast cancer Cousin 82       1 st cousin. Maternal     DRUG ALLERGIES:   Allergies  Allergen Reactions  . Tape Itching    Skin Dermatitis/itching (tape adhesive)    REVIEW OF SYSTEMS:  CONSTITUTIONAL: No fever.  Positive cold chills fatigue or weakness.  EYES: No blurred or double vision.  EARS, NOSE, AND THROAT: No tinnitus or ear pain. No sore throat.  Positive for runny nose RESPIRATORY: No cough, shortness of breath, wheezing or hemoptysis.  CARDIOVASCULAR: No chest pain, orthopnea, edema.  GASTROINTESTINAL: Positive for nausea, vomiting.no diarrhea or abdominal pain. No blood in bowel movements GENITOURINARY: No dysuria, hematuria.  ENDOCRINE: No polyuria, nocturia,  HEMATOLOGY: No anemia, easy bruising or bleeding SKIN: No rash or lesion. MUSCULOSKELETAL: No joint pain or arthritis.   NEUROLOGIC: No tingling, numbness, weakness.  PSYCHIATRY: No anxiety or depression.   MEDICATIONS AT HOME:   Prior to Admission medications   Medication Sig Start Date End Date Taking? Authorizing Provider  acetaminophen (TYLENOL) 325 MG tablet Take 2 tablets (650 mg total) by mouth every 4 (four) hours as needed for mild pain (or temp > 37.5 C (99.5 F)). 11/17/17  Yes Gouru, Illene Silver, MD  albuterol-ipratropium (COMBIVENT) 18-103 MCG/ACT inhaler Inhale 2 puffs into the lungs 2 (two) times daily.   Yes [provider]  calcium acetate (PHOSLO) 667 MG capsule Take 2,001 mg by mouth 3 (three) times daily with meals.    Yes [provider]  cinacalcet (SENSIPAR) 60 MG tablet Take 60 mg by mouth daily.   Yes [provider]  clopidogrel (PLAVIX) 75 MG tablet TAKE ONE TABLET BY MOUTH ONCE DAILY Patient taking differently: Take 75 mg by mouth daily.  06/14/16  Yes Stegmayer, Joelene Millin A, PA-C  gabapentin (NEURONTIN) 100 MG capsule Take 100 mg by mouth 3 (three) times daily.   Yes [provider]  insulin NPH-regular Human (NOVOLIN 70/30) (70-30) 100 UNIT/ML injection Inject 25 Units into the skin 2 (two) times daily with a meal. Except dialysis days. Do not take on Tuesdays, Thursdays, and Saturdays. 11/17/17  Yes Gouru, Illene Silver, MD  levETIRAcetam (KEPPRA) 1000 MG tablet Take 1 tablet (1,000 mg total) by mouth daily. 03/15/18  Yes Vaughan Basta, MD  levETIRAcetam (KEPPRA) 250 MG tablet Take 1 tablet (250 mg total) by mouth 3 (three) times a week. Take after dialysis on Monday, wed and Friday in addition to regular dose. 03/14/18  Yes Vaughan Basta, MD  lidocaine-prilocaine (EMLA) cream Apply 1 application topically as needed (for port access).  Yes [provider]  losartan (COZAAR) 100 MG tablet Take 100 mg by mouth daily. 09/12/17  Yes [provider]  metoprolol succinate (TOPROL-XL) 100 MG 24 hr tablet Take 100 mg by mouth every evening. Take with or immediately following a meal.    Yes [provider]  omeprazole (PRILOSEC) 20 MG capsule Take 20 mg by mouth 2 (two) times daily.    Yes [provider]  polyethylene glycol (MIRALAX / GLYCOLAX) packet Take 17 g by mouth daily as needed for moderate constipation. 11/17/17  Yes Gouru, Illene Silver, MD  torsemide (DEMADEX) 100 MG tablet Take 100 mg by mouth daily. 10/10/17  Yes [provider]  atorvastatin (LIPITOR) 80 MG tablet Take 80 mg by mouth every evening.     [provider]  budesonide-formoterol (SYMBICORT) 160-4.5 MCG/ACT inhaler Inhale 2 puffs into the lungs 2 (two) times daily. Patient not taking: Reported on 05/29/2018 07/01/17   Loletha Grayer, MD  ipratropium-albuterol (DUONEB) 0.5-2.5 (3) MG/3ML SOLN Take 3 mLs by nebulization every 6 (six) hours as needed (for shortness of breath/ wheezing).     [provider]      VITAL SIGNS:  Blood pressure (!) 164/78, pulse 61, temperature 98.1 F (36.7 C), temperature source Oral, resp. rate 11, height 5\' 8"  (1.727 m), weight 120 kg, SpO2 93 %.  PHYSICAL EXAMINATION:  GENERAL:  67 y.o.-year-old patient lying in the bed with no acute distress.  EYES: Pupils equal, round, reactive to light and accommodation. No scleral icterus. Extraocular muscles intact.  No photophobia. HEENT: Head atraumatic, normocephalic. Oropharynx and nasopharynx clear.  NECK:  Supple, no jugular venous distention. No thyroid enlargement, no tenderness.  LUNGS: Decreased breath sounds bilateral bases, no wheezing, rales,rhonchi or crepitation. No use of accessory muscles of respiration.  CARDIOVASCULAR: S1, S2 normal. No murmurs, rubs, or gallops.  ABDOMEN: Soft, nontender, nondistended. Bowel sounds present. No organomegaly or mass.  EXTREMITIES: Trace pedal edema, no cyanosis, or clubbing.  NEUROLOGIC: Cranial nerves II through XII are intact. Muscle strength 5/5 in all extremities. Sensation intact. Gait not checked.  Negative Kernig sign. PSYCHIATRIC: The patient is alert and oriented x 3.  SKIN: No rash, lesion, or ulcer.   LABORATORY PANEL:   CBC Recent Labs  Lab 05/29/18 1018  WBC 8.9  HGB 13.5  HCT 42.6  PLT 194   ------------------------------------------------------------------------------------------------------------------  Chemistries  Recent Labs  Lab 05/29/18 1018  NA 140  K 4.5  CL 99  CO2 27  GLUCOSE 176*  BUN 39*  CREATININE 8.36*  CALCIUM 9.4    ------------------------------------------------------------------------------------------------------------------  Cardiac Enzymes Recent Labs  Lab 05/29/18 1345  TROPONINI <0.03   ------------------------------------------------------------------------------------------------------------------  RADIOLOGY:  Ct Head Wo Contrast  Result Date: 05/29/2018 CLINICAL DATA:  Headache. Patient reports progressive right-sided weakness after receiving dialysis yesterday. Normal neurologic exam. EXAM: CT HEAD WITHOUT CONTRAST TECHNIQUE: Contiguous axial images were obtained from the base of the skull through the vertex without intravenous contrast. COMPARISON:  03/13/2018 FINDINGS: Brain: No evidence of acute infarction, hemorrhage, hydrocephalus, extra-axial collection or mass lesion/mass effect. Encephalomalacia in the right frontal lobe and left posterior parietal lobe identified compatible with chronic infarcts. There is mild diffuse low-attenuation within the subcortical and periventricular white matter compatible with chronic microvascular disease. Vascular: No hyperdense vessel or unexpected calcification. Skull: Normal. Negative for fracture or focal lesion. Sinuses/Orbits: No acute finding. Other: None. IMPRESSION: 1. No acute intracranial abnormalities. 2. 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 3. Chronic small vessel ischemic  change. Electronically Signed   By: Kerby Moors M.D.   On: 05/29/2018 11:01    EKG:   Normal sinus rhythm 64 bpm nonspecific ST-T wave changes  IMPRESSION AND PLAN:   1.  Persistent nausea vomiting and headache.  ER physician ordered an MRI of the brain.  CT scan of the head was negative.  Patient on Plavix for previous stroke.  Try to settle down nausea vomiting with dose of IV Ativan and PRN Zofran.  Try to settle down headache with 1 dose of Toradol and magnesium IV.  Reglan just in case this is diabetic gastroparesis 2.  End-stage renal  disease.  Consult nephrology for dialysis for tomorrow. 3.  History of stroke on aspirin 4.  Hyperlipidemia unspecified on statin 5.  Essential hypertension continue usual medications for tomorrow 6.  Type 2 diabetes mellitus without complication.  Decrease dose of insulin. 7.  COPD.  Respiratory status stable.  Obtain a chest x-ray 8.  Urinalysis urine culture.  All the records are reviewed and case discussed with ED provider. Management plans discussed with the patient, family and they are in agreement.  CODE STATUS: full code  TOTAL TIME TAKING CARE OF THIS PATIENT: 50 minutes.    Loletha Grayer M.D on 05/29/2018 at 3:46 PM  Between 7am to 6pm - Pager - [phone]  After 6pm call admission pager [phone]  [facility] Office  [phone]  CC: Primary care physician; Lowella Bandy, MD

## 2018-05-30 ENCOUNTER — Ambulatory Visit (INDEPENDENT_AMBULATORY_CARE_PROVIDER_SITE_OTHER): Payer: Medicare HMO | Admitting: *Deleted

## 2018-05-30 ENCOUNTER — Telehealth: Payer: Self-pay | Admitting: Internal Medicine

## 2018-05-30 DIAGNOSIS — R112 Nausea with vomiting, unspecified: Secondary | ICD-10-CM

## 2018-05-30 DIAGNOSIS — I6310 Cerebral infarction due to embolism of unspecified precerebral artery: Secondary | ICD-10-CM | POA: Diagnosis not present

## 2018-05-30 HISTORY — DX: Nausea with vomiting, unspecified: R11.2

## 2018-05-30 LAB — GLUCOSE, CAPILLARY
GLUCOSE-CAPILLARY: 126 mg/dL — AB (ref 70–99)
Glucose-Capillary: 117 mg/dL — ABNORMAL HIGH (ref 70–99)
Glucose-Capillary: 74 mg/dL (ref 70–99)

## 2018-05-30 LAB — BASIC METABOLIC PANEL
Anion gap: 11 (ref 5–15)
BUN: 52 mg/dL — AB (ref 8–23)
CO2: 29 mmol/L (ref 22–32)
CREATININE: 9.99 mg/dL — AB (ref 0.44–1.00)
Calcium: 9 mg/dL (ref 8.9–10.3)
Chloride: 100 mmol/L (ref 98–111)
GFR calc Af Amer: 4 mL/min — ABNORMAL LOW (ref >60)
GFR, EST NON AFRICAN AMERICAN: 4 mL/min — AB (ref >60)
Glucose, Bld: 120 mg/dL — ABNORMAL HIGH (ref 70–99)
Potassium: 4.6 mmol/L (ref 3.5–5.1)
SODIUM: 140 mmol/L (ref 135–145)

## 2018-05-30 LAB — CBC
HEMATOCRIT: 38.7 % (ref 36.0–46.0)
Hemoglobin: 12 g/dL (ref 12.0–15.0)
MCH: 27.5 pg (ref 26.0–34.0)
MCHC: 31 g/dL (ref 30.0–36.0)
MCV: 88.6 fL (ref 80.0–100.0)
Platelets: 161 10*3/uL (ref 150–400)
RBC: 4.37 MIL/uL (ref 3.87–5.11)
RDW: 14 % (ref 11.5–15.5)
WBC: 7.1 10*3/uL (ref 4.0–10.5)
nRBC: 0 % (ref 0.0–0.2)

## 2018-05-30 MED ORDER — CHLORHEXIDINE GLUCONATE CLOTH 2 % EX PADS
6.0000 | MEDICATED_PAD | Freq: Every day | CUTANEOUS | Status: DC
  Administered 2018-05-30: 6 via TOPICAL

## 2018-05-30 NOTE — Progress Notes (Signed)
[facility]link Summary Report / Loop Recorder 

## 2018-05-30 NOTE — Progress Notes (Signed)
[facility], [place] 05/30/18  Subjective:   Presented with right sided weakness and headache. Was about to be discharged, but had nausea and vomiting therefore admitted for investigation This morning patient ate 100% of her breakfast Speech is slow, difficulty getting words out   Objective:  Vital signs in last 24 hours:  Temp:  [97.4 F (36.3 C)-98.1 F (36.7 C)] 97.4 F (36.3 C) (11/06 0518) Pulse Rate:  [52-62] 56 (11/06 0916) Resp:  [11-21] 18 (11/06 0916) BP: (133-194)/(60-98) 179/64 (11/06 0916) SpO2:  [92 %-100 %] 95 % (11/06 0916) Weight:  [117.6 kg-120 kg] 117.6 kg (11/06 0518)  Weight change:  Filed Weights   05/29/18 1002 05/29/18 1851 05/30/18 0518  Weight: 120 kg 117.7 kg 117.6 kg    Intake/Output:    Intake/Output Summary (Last 24 hours) at 05/30/2018 5400 Last data filed at 05/30/2018 0518 Gross per 24 hour  Intake -  Output 0 ml  Net 0 ml     Physical Exam: General:  No acute distress, laying in the bed  HEENT  anicteric, moist oral mucous membranes  Neck  supple  Pulm/lungs  normal breathing effort, clear to auscultation, 2 L O2  CVS/Heart  regular, no rub  Abdomen:   Soft, nontender  Extremities:  No edema  Neurologic:  Alert, oriented, speech is slowing  Skin:  No acute rashes  Access:  Left arm AV graft       Basic Metabolic Panel:  Recent Labs  Lab 05/29/18 1018 05/30/18 0427  NA 140 140  K 4.5 4.6  CL 99 100  CO2 27 29  GLUCOSE 176* 120*  BUN 39* 52*  CREATININE 8.36* 9.99*  CALCIUM 9.4 9.0     CBC: Recent Labs  Lab 05/29/18 1018 05/30/18 0427  WBC 8.9 7.1  HGB 13.5 12.0  HCT 42.6 38.7  MCV 87.1 88.6  PLT 194 161      Lab Results  Component Value Date   HEPBSAG Negative 06/28/2017      Microbiology:  Recent Results (from the past 240 hour(s))  MRSA PCR Screening     Status: None   Collection Time: 05/29/18  7:00 PM  Result Value Ref Range Status   MRSA by PCR NEGATIVE  NEGATIVE Final    Comment:        The GeneXpert MRSA Assay (FDA approved for NASAL specimens only), is one component of a comprehensive MRSA colonization surveillance program. It is not intended to diagnose MRSA infection nor to guide or monitor treatment for MRSA infections. Performed at [facility], [address] [place], [place] [address]     Coagulation Studies: No results for input(s): LABPROT, INR in the last 72 hours.  Urinalysis: No results for input(s): COLORURINE, LABSPEC, PHURINE, GLUCOSEU, HGBUR, BILIRUBINUR, KETONESUR, PROTEINUR, UROBILINOGEN, NITRITE, LEUKOCYTESUR in the last 72 hours.  Invalid input(s): APPERANCEUR    Imaging: Dg Chest 2 View  Result Date: 05/29/2018 CLINICAL DATA:  History of stroke and hypertension.  Headache. EXAM: CHEST - 2 VIEW COMPARISON:  03/08/2018 FINDINGS: Cardiac silhouette is enlarged compared to the previous study. There is pulmonary venous hypertension. Question early interstitial edema. Small amount of fluid in the fissures. Loop recorder is present. No acute bone finding. IMPRESSION: Probable congestive heart failure. The cardiac silhouette is larger. There is venous hypertension with early interstitial edema and a small amount of fluid in the fissures. Electronically Signed   By: Nelson Chimes M.D.   On: 05/29/2018 16:19  Ct Head Wo Contrast  Result Date: 05/29/2018 CLINICAL DATA:  Headache. Patient reports progressive right-sided weakness after receiving dialysis yesterday. Normal neurologic exam. EXAM: CT HEAD WITHOUT CONTRAST TECHNIQUE: Contiguous axial images were obtained from the base of the skull through the vertex without intravenous contrast. COMPARISON:  03/13/2018 FINDINGS: Brain: No evidence of acute infarction, hemorrhage, hydrocephalus, extra-axial collection or mass lesion/mass effect. Encephalomalacia in the right frontal lobe and left posterior parietal lobe identified compatible with chronic infarcts. There  is mild diffuse low-attenuation within the subcortical and periventricular white matter compatible with chronic microvascular disease. Vascular: No hyperdense vessel or unexpected calcification. Skull: Normal. Negative for fracture or focal lesion. Sinuses/Orbits: No acute finding. Other: None. IMPRESSION: 1. No acute intracranial abnormalities. 2. 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 3. Chronic small vessel ischemic change. Electronically Signed   By: Kerby Moors M.D.   On: 05/29/2018 11:01   Mr Brain Wo Contrast  Result Date: 05/29/2018 CLINICAL DATA:  67 y/o F; headache, nausea, vomiting starting today. History of stroke. EXAM: MRI HEAD WITHOUT CONTRAST TECHNIQUE: Multiplanar, multiecho pulse sequences of the brain and surrounding structures were obtained without intravenous contrast. COMPARISON:  05/29/2018 CT head. 03/07/2018 MRI head. 03/08/2018 MRA head. FINDINGS: Brain: 16 mm focus of diffusion hyperintensity with intermediate ADC within left posterior lateral frontal centrum semiovale compatible with subacute infarction (series 5, image 19 and series 4, image 19). No reduced diffusion to suggest acute or early subacute infarction. Small chronic infarcts are present within the right frontal and left parietal cortices. Very small chronic infarctions within the bilateral cerebellar hemispheres. Small chronic lacunar infarct within the right lentiform nucleus extending into corona radiata. Advanced chronic microvascular ischemic changes and volume loss of the brain. There is mild hemosiderin staining of the right frontal and left parietal cortical infarctions. No additional focus of susceptibility hypointensity to indicate intracranial hemorrhage. No mass effect, extra-axial collection, hydrocephalus, or herniation. Vascular: Normal flow voids. Skull and upper cervical spine: Normal marrow signal. Sinuses/Orbits: Negative. Other: None. IMPRESSION: 1. No acute intracranial  abnormality identified. 2. Subacute infarct within the left posterolateral frontal lobe. 3. Chronic infarcts in the right frontal lobe, left parietal lobe, right lentiform nucleus, and bilateral cerebellar hemispheres. 4. Advanced chronic microvascular ischemic changes and volume loss of the brain. Electronically Signed   By: Kristine Garbe M.D.   On: 05/29/2018 19:08     Medications:    . atorvastatin  80 mg Oral QPM  . calcium acetate  2,001 mg Oral TID WC  . Chlorhexidine Gluconate Cloth  6 each Topical Q0600  . cinacalcet  60 mg Oral Q breakfast  . clopidogrel  75 mg Oral Daily  . gabapentin  100 mg Oral TID  . heparin  5,000 Units Subcutaneous Q8H  . insulin aspart protamine- aspart  10 Units Subcutaneous BID WC  . levETIRAcetam  1,000 mg Oral Daily  . levETIRAcetam  250 mg Oral Once per day on Mon Wed Fri  . losartan  100 mg Oral Daily  . metoCLOPramide (REGLAN) injection  5 mg Intravenous Q8H  . metoprolol succinate  100 mg Oral QPM  . 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   [facility]/MWF/[facility] [place] [facility]/ 240 MIN/ 119KG/MWF  1.  ESRD on HD MWF.  Patient due for hemodialysis today.   We will go ahead and prepare orders for dialysis today   2.  Anemia of chronic kidney disease.  Hemoglobin currently 12.0.  Hold off on Epogen.  3.  Secondary  hyperparathyroidism.  Maintain the patient on home doses of calcium acetate as well as cinacalcet.       LOS: [place] 11/6/20199:52 AM  [place], [place]  Note: This note was prepared with Dragon dictation. Any transcription errors are unintentional

## 2018-05-30 NOTE — Progress Notes (Signed)
PT Cancellation Note  Patient Details Name: Charlene Detter MRN: [id number] DOB: 06-12-1951   Cancelled Treatment:    Reason Eval/Treat Not Completed: Patient at procedure or test/unavailable, will attempt to see pt at a future date/time as appropriate.     Linus Salmons PT, DPT 05/30/18, 1:27 PM

## 2018-05-30 NOTE — Progress Notes (Signed)
Pre HD assessment   05/30/18 1200  Neurological  Level of Consciousness Alert  Orientation Level Oriented X4  Respiratory  Respiratory Pattern Regular;Unlabored  Chest Assessment Chest expansion symmetrical  Cough None  Cardiac  Pulse Regular  Heart Sounds S1, S2  Jugular Venous Distention (JVD) No  ECG Monitor Yes  Cardiac Rhythm NSR  Vascular  R Radial Pulse +2  L Radial Pulse +2  Psychosocial  Psychosocial (WDL) WDL

## 2018-05-30 NOTE — Discharge Summary (Signed)
[facility] [place], 67 y.o., DOB Mar 09, 1951, MRN [id number]. Admission date: 05/29/2018 Discharge Date 05/30/2018 Primary MD Lowella Bandy, MD Admitting Physician Loletha Grayer, MD  Admission Diagnosis  Cough [R05] Abnormal EKG [R94.31] Acute nonintractable headache, unspecified headache type [R51] Intractable vomiting with nausea, unspecified vomiting type [R11.2]  Discharge Diagnosis   Active Problems: Nausea vomiting possibly due to gastritis resolved Generalized weakness with recent stroke needs home PT Recent subacute stroke no new stroke noted COPD Coronary artery disease End-stage renal disease Hyperlipidemia Hypertension   Hospital Course  Cynthia Dean  is a 67 y.o. female with a known history of end-stage renal disease presents with headache and nausea vomiting.  Patient was also having a headache therefore there was some concern for stroke due to recent history of stroke she was evaluated in the ED and had a MRI of the brain which showed subacute stroke no new stroke.  Patient was admitted for further evaluation.  She was provided supportive care with resolution of her symptoms.  Patient is doing much better and is stable for discharge.  We will arrange some home health and PT for her.            Consults  nephrology  Significant Tests:  See full reports for all details     Dg Chest 2 View  Result Date: 05/29/2018 CLINICAL DATA:  History of stroke and hypertension.  Headache. EXAM: CHEST - 2 VIEW COMPARISON:  03/08/2018 FINDINGS: Cardiac silhouette is enlarged compared to the previous study. There is pulmonary venous hypertension. Question early interstitial edema. Small amount of fluid in the fissures. Loop recorder is present. No acute bone finding. IMPRESSION: Probable congestive heart failure. The cardiac silhouette is larger. There is venous hypertension with early interstitial edema and a small  amount of fluid in the fissures. Electronically Signed   By: Nelson Chimes M.D.   On: 05/29/2018 16:19   Ct Head Wo Contrast  Result Date: 05/29/2018 CLINICAL DATA:  Headache. Patient reports progressive right-sided weakness after receiving dialysis yesterday. Normal neurologic exam. EXAM: CT HEAD WITHOUT CONTRAST TECHNIQUE: Contiguous axial images were obtained from the base of the skull through the vertex without intravenous contrast. COMPARISON:  03/13/2018 FINDINGS: Brain: No evidence of acute infarction, hemorrhage, hydrocephalus, extra-axial collection or mass lesion/mass effect. Encephalomalacia in the right frontal lobe and left posterior parietal lobe identified compatible with chronic infarcts. There is mild diffuse low-attenuation within the subcortical and periventricular white matter compatible with chronic microvascular disease. Vascular: No hyperdense vessel or unexpected calcification. Skull: Normal. Negative for fracture or focal lesion. Sinuses/Orbits: No acute finding. Other: None. IMPRESSION: 1. No acute intracranial abnormalities. 2. 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 3. Chronic small vessel ischemic change. Electronically Signed   By: Kerby Moors M.D.   On: 05/29/2018 11:01   Mr Brain Wo Contrast  Result Date: 05/29/2018 CLINICAL DATA:  67 y/o F; headache, nausea, vomiting starting today. History of stroke. EXAM: MRI HEAD WITHOUT CONTRAST TECHNIQUE: Multiplanar, multiecho pulse sequences of the brain and surrounding structures were obtained without intravenous contrast. COMPARISON:  05/29/2018 CT head. 03/07/2018 MRI head. 03/08/2018 MRA head. FINDINGS: Brain: 16 mm focus of diffusion hyperintensity with intermediate ADC within left posterior lateral frontal centrum semiovale compatible with subacute infarction (series 5, image 19 and series 4, image 19). No reduced diffusion to suggest acute or early subacute infarction. Small chronic infarcts are present  within the right frontal and left parietal  cortices. Very small chronic infarctions within the bilateral cerebellar hemispheres. Small chronic lacunar infarct within the right lentiform nucleus extending into corona radiata. Advanced chronic microvascular ischemic changes and volume loss of the brain. There is mild hemosiderin staining of the right frontal and left parietal cortical infarctions. No additional focus of susceptibility hypointensity to indicate intracranial hemorrhage. No mass effect, extra-axial collection, hydrocephalus, or herniation. Vascular: Normal flow voids. Skull and upper cervical spine: Normal marrow signal. Sinuses/Orbits: Negative. Other: None. IMPRESSION: 1. No acute intracranial abnormality identified. 2. Subacute infarct within the left posterolateral frontal lobe. 3. Chronic infarcts in the right frontal lobe, left parietal lobe, right lentiform nucleus, and bilateral cerebellar hemispheres. 4. Advanced chronic microvascular ischemic changes and volume loss of the brain. Electronically Signed   By: Kristine Garbe M.D.   On: 05/29/2018 19:08       Today   Subjective:   [name] patient denies any nausea vomiting wears chronic oxygen at home Objective:   Blood pressure 131/71, pulse 60, temperature (!) 97.4 F (36.3 C), temperature source Oral, resp. rate (!) 25, height 5\' 7"  (1.702 m), weight 117.6 kg, SpO2 100 %.  .  Intake/Output Summary (Last 24 hours) at 05/30/2018 1517 Last data filed at 05/30/2018 0518 Gross per 24 hour  Intake -  Output 0 ml  Net 0 ml    Exam VITAL SIGNS: Blood pressure 131/71, pulse 60, temperature (!) 97.4 F (36.3 C), temperature source Oral, resp. rate (!) 25, height 5\' 7"  (1.702 m), weight 117.6 kg, SpO2 100 %.  GENERAL:  67 y.o.-year-old patient lying in the bed with no acute distress.  EYES: Pupils equal, round, reactive to light and accommodation. No scleral icterus. Extraocular muscles intact.  HEENT: Head  atraumatic, normocephalic. Oropharynx and nasopharynx clear.  NECK:  Supple, no jugular venous distention. No thyroid enlargement, no tenderness.  LUNGS: Normal breath sounds bilaterally, no wheezing, rales,rhonchi or crepitation. No use of accessory muscles of respiration.  CARDIOVASCULAR: S1, S2 normal. No murmurs, rubs, or gallops.  ABDOMEN: Soft, nontender, nondistended. Bowel sounds present. No organomegaly or mass.  EXTREMITIES: No pedal edema, cyanosis, or clubbing.  NEUROLOGIC: Cranial nerves II through XII are intact. Muscle strength 5/5 in all extremities. Sensation intact. Gait not checked.  PSYCHIATRIC: The patient is alert and oriented x 3.  SKIN: No obvious rash, lesion, or ulcer.   Data Review     CBC w Diff:  Lab Results  Component Value Date   WBC 7.1 05/30/2018   HGB 12.0 05/30/2018   HGB 9.5 (L) 06/17/2014   HCT 38.7 05/30/2018   HCT 29.6 (L) 06/17/2014   PLT 161 05/30/2018   PLT 173 06/17/2014   LYMPHOPCT 38 03/13/2018   LYMPHOPCT 30.5 06/17/2014   MONOPCT 13 03/13/2018   MONOPCT 12.4 06/17/2014   EOSPCT 1 03/13/2018   EOSPCT 2.5 06/17/2014   BASOPCT 2 03/13/2018   BASOPCT 0.6 06/17/2014   CMP:  Lab Results  Component Value Date   NA 140 05/30/2018   NA 135 (L) 06/16/2014   K 4.6 05/30/2018   K 4.8 06/16/2014   CL 100 05/30/2018   CL 100 06/16/2014   CO2 29 05/30/2018   CO2 22 06/16/2014   BUN 52 (H) 05/30/2018   BUN 47 (H) 06/16/2014   CREATININE 9.99 (H) 05/30/2018   CREATININE 7.60 (H) 06/16/2014   PROT 7.7 03/13/2018   PROT 8.2 06/14/2014   ALBUMIN 3.8 03/13/2018   ALBUMIN 2.9 (L) 06/16/2014   BILITOT 0.7 03/13/2018  BILITOT 0.6 06/14/2014   ALKPHOS 100 03/13/2018   ALKPHOS 233 (H) 06/14/2014   AST 18 03/13/2018   AST 29 06/14/2014   ALT 12 03/13/2018   ALT 20 06/14/2014  .  Micro Results Recent Results (from the past 240 hour(s))  MRSA PCR Screening     Status: None   Collection Time: 05/29/18  7:00 PM  Result Value Ref Range  Status   MRSA by PCR NEGATIVE NEGATIVE Final    Comment:        The GeneXpert MRSA Assay (FDA approved for NASAL specimens only), is one component of a comprehensive MRSA colonization surveillance program. It is not intended to diagnose MRSA infection nor to guide or monitor treatment for MRSA infections. Performed at [facility], [address] [place], [place] [address]         Code Status Orders  (From admission, onward)         Start     Ordered   05/29/18 1540  Full code  Continuous     05/29/18 1541        Code Status History    Date Active Date Inactive Code Status Order ID Comments User Context   03/14/2018 0141 03/15/2018 0336 Full Code [id number]  Amelia Jo, MD Inpatient   03/08/2018 0005 03/09/2018 2108 Full Code [id number]  Vaughan Basta, MD Inpatient   11/15/2017 0017 11/17/2017 1809 Full Code [id number]  Lance Coon, MD ED   11/12/2017 2219 11/13/2017 1935 Full Code [id number]  Lance Coon, MD ED   04/14/2016 1330 04/14/2016 1700 Full Code [id number]  Bettey Costa, MD ED   08/08/2015 2257 08/09/2015 2054 Full Code [id number]  Hower, Aaron Mose, MD ED   04/17/2015 1334 04/17/2015 1836 Full Code [id number]  Dew, Erskine Squibb, MD Inpatient          Follow-up Information    Lowella Bandy, MD. Schedule an appointment as soon as possible for a visit in 2 days.   Specialty:  Pediatrics Contact information: [place] [address] [phone]        Deboraha Sprang, MD. Schedule an appointment as soon as possible for a visit in 3 days.   Specialty:  Cardiology Contact information: [place] [id number] [phone]        Lowella Bandy, MD .   Specialty:  Pediatrics Contact information: [place] [address] [phone]           Discharge Medications   Allergies as of 05/30/2018      Reactions   Tape Itching    Skin Dermatitis/itching (tape adhesive)      Medication List    TAKE these medications   acetaminophen 325 MG tablet Commonly known as:  TYLENOL Take 2 tablets (650 mg total) by mouth every 4 (four) hours as needed for mild pain (or temp > 37.5 C (99.5 F)).   atorvastatin 80 MG tablet Commonly known as:  LIPITOR Take 80 mg by mouth every evening.   budesonide-formoterol 160-4.5 MCG/ACT inhaler Commonly known as:  SYMBICORT Inhale 2 puffs into the lungs 2 (two) times daily.   calcium acetate 667 MG capsule Commonly known as:  PHOSLO Take 2,001 mg by mouth 3 (three) times daily with meals.   cinacalcet 60 MG tablet Commonly known as:  SENSIPAR Take 60 mg by mouth daily.   clopidogrel 75 MG tablet Commonly known as:  PLAVIX TAKE ONE TABLET BY MOUTH ONCE DAILY   gabapentin 100 MG capsule Commonly known as:  NEURONTIN Take 100 mg by mouth 3 (three) times daily.   insulin NPH-regular Human (70-30) 100 UNIT/ML injection Inject 25 Units into the skin 2 (two) times daily with a meal. Except dialysis days. Do not take on Tuesdays, Thursdays, and Saturdays.   ipratropium-albuterol 0.5-2.5 (3) MG/3ML Soln Commonly known as:  DUONEB Take 3 mLs by nebulization every 6 (six) hours as needed (for shortness of breath/ wheezing).   albuterol-ipratropium 18-103 MCG/ACT inhaler Commonly known as:  COMBIVENT Inhale 2 puffs into the lungs 2 (two) times daily.   levETIRAcetam 250 MG tablet Commonly known as:  KEPPRA Take 1 tablet (250 mg total) by mouth 3 (three) times a week. Take after dialysis on Monday, wed and Friday in addition to regular dose.   levETIRAcetam 1000 MG tablet Commonly known as:  KEPPRA Take 1 tablet (1,000 mg total) by mouth daily.   lidocaine-prilocaine cream Commonly known as:  EMLA Apply 1 application topically as needed (for port access).   losartan 100 MG tablet Commonly known as:  COZAAR Take 100 mg by mouth daily.   metoprolol succinate 100 MG 24  hr tablet Commonly known as:  TOPROL-XL Take 100 mg by mouth every evening. Take with or immediately following a meal.   omeprazole 20 MG capsule Commonly known as:  PRILOSEC Take 20 mg by mouth 2 (two) times daily.   polyethylene glycol packet Commonly known as:  MIRALAX / GLYCOLAX Take 17 g by mouth daily as needed for moderate constipation.   torsemide 100 MG tablet Commonly known as:  DEMADEX Take 100 mg by mouth daily.          Total Time in preparing paper work, data evaluation and todays exam - 50 minutes  Dustin Flock M.D on 05/30/2018 at 3:17 PM [place]  [phone]

## 2018-05-30 NOTE — Progress Notes (Signed)
HD tx end    05/30/18 1529  Vital Signs  Pulse Rate 63  Pulse Rate Source Monitor  Resp 19  BP 128/68  BP Location Right Arm  BP Method Automatic  Patient Position (if appropriate) Lying  Oxygen Therapy  SpO2 100 %  O2 Device Nasal Cannula  O2 Flow Rate (L/min) 2 L/min  During Hemodialysis Assessment  Dialysis Fluid Bolus Normal Saline  Bolus Amount (mL) 250 mL  Intra-Hemodialysis Comments Tx completed

## 2018-05-30 NOTE — Care Management Obs Status (Signed)
[facility] NOTIFICATION   Patient Details  Name: Cynthia Dean MRN: [id number] Date of Birth: 08-22-1950   Medicare Observation Status Notification Given:  Yes    Elza Rafter, RN 05/30/2018, 3:41 PM

## 2018-05-30 NOTE — Telephone Encounter (Signed)
Lmov for patient to call back She was seen in ED  Will need to schedule appointment with Dr Caryl Comes for this

## 2018-05-30 NOTE — Progress Notes (Signed)
Patient back from HD.  No complaints of pain.  D/c telemetry and PIV.  No changes to medications.  Patient escorted out hospital via wheelchair by volunteers.

## 2018-05-30 NOTE — Care Management (Signed)
To patient's room to present code 44.  She is currently in HD.

## 2018-05-30 NOTE — Evaluation (Signed)
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  Clinical Impression  Patient A&Ox4 at start of session, no complaints of pain or discomfort, though pt expressed frustration, eager to return home, and desire to eat. Pt's family reports living in 1 story home with husband available intermittently, independent at baseline though husband performs most IADLs, pt does not drive. Patient performed bed mobility mod I, transferred with supervision. Static standing pt reaches for support, ambulated in hall ~43ft with CGA-supervision, some unsteadiness/staggering noted. The patient would benefit from HHPT to address balance and activity tolerance deficits to maximize independence, mobility, and safety.    Follow Up Recommendations Home health PT    Equipment Recommendations  Cane    Recommendations for Other Services       Precautions / Restrictions Precautions Precautions: Fall Restrictions Weight Bearing Restrictions: No      Mobility  Bed Mobility Overal bed mobility: Modified Independent                Transfers Overall transfer level: Independent                  Ambulation/Gait   Gait Distance (Feet): 80 Feet Assistive device: None       General Gait Details: Patient has slightly staggering gait. Slight unsteadiness noted with turns. Pt reached for support during static standing.   Stairs            Wheelchair Mobility    Modified Rankin (Stroke Patients Only)       Balance Overall balance assessment: Mild deficits observed, not formally tested                                           Pertinent Vitals/Pain Pain Assessment: No/denies pain    Home Living Family/patient expects to be discharged to:: Private residence Living Arrangements: Spouse/significant other Available Help at Discharge: Family;Available PRN/intermittently Type of Home: House Home Access: Stairs to enter Entrance Stairs-Rails: Can reach both Entrance Stairs-Number of Steps: 2-3 Home Layout: One level Home Equipment: Shower seat      Prior Function Level of Independence: Independent         Comments: Does not work, does not drive, husband performs most IADLs.     Hand Dominance        Extremity/Trunk Assessment   Upper Extremity Assessment Upper Extremity Assessment: RUE deficits/detail;LUE deficits/detail RUE Deficits / Details: Pt unwilling to allow PT to fully assess strength due to agitation, AROM grossly symmetrical LUE Deficits / Details: Pt unwilling to allow PT to fully assess strength due to agitation, AROM grossly symmetrical    Lower Extremity Assessment Lower Extremity Assessment: RLE deficits/detail;LLE deficits/detail RLE Deficits / Details: Pt unwilling to allow PT to fully assess strength due to agitation, AROM grossly symmetrical LLE Deficits / Details: Pt unwilling to allow PT to fully assess strength due to agitation, AROM grossly symmetrical       Communication  Cognition Arousal/Alertness: Awake/alert Behavior During Therapy: Agitated Overall Cognitive Status: Within Functional Limits for tasks assessed                                        General Comments      Exercises     Assessment/Plan    PT Assessment Patient needs continued PT services  PT Problem List Decreased strength;Decreased  mobility;Decreased balance       PT Treatment Interventions DME instruction;Balance training;Gait training;Neuromuscular re-education;Stair training;Functional mobility training;Patient/family education;Therapeutic activities    PT Goals (Current goals can be found in the Care Plan section)  Acute Rehab PT Goals Patient Stated Goal: patient wants to go home PT Goal Formulation: With patient Time For Goal Achievement: 06/13/18 Potential to Achieve Goals: Good    Frequency Min 2X/week   Barriers to discharge        Co-evaluation               AM-PAC PT "6 Clicks" Daily Activity  Outcome Measure Difficulty turning over in bed (including adjusting bedclothes, sheets and blankets)?: None Difficulty moving from lying on back to sitting on the side of the bed? : None Difficulty sitting down on and standing up from a chair with arms (e.g., wheelchair, bedside commode, etc,.)?: None Help needed moving to and from a bed to chair (including a wheelchair)?: None Help needed walking in hospital room?: A Little Help needed climbing 3-5 steps with a railing? : A Little 6 Click Score: 22    End of Session Equipment Utilized During Treatment: Gait belt Activity Tolerance: Treatment limited secondary to agitation Patient left: in chair;with family/visitor present;with call bell/phone within reach Nurse Communication: Mobility status PT Visit Diagnosis: Unsteadiness on feet (R26.81)    Time: 4650-3546 PT Time Calculation (min) (ACUTE ONLY): 23 min   Charges:   PT Evaluation $PT Eval Low Complexity: 1 Low PT Treatments $Therapeutic Activity: 8-22 mins       Lieutenant Diego PT, DPT 5:07 PM,05/30/18 [phone]

## 2018-05-30 NOTE — Care Management CC44 (Signed)
Condition Code 44 Documentation Completed  Patient Details  Name: Cynthia Dean MRN: [id number] Date of Birth: 16-Nov-1950   Condition Code 44 given:  Yes Patient signature on Condition Code 44 notice:  Yes Documentation of 2 MD's agreement:  Yes Code 44 added to claim:  Yes    Elza Rafter, RN 05/30/2018, 3:41 PM

## 2018-05-30 NOTE — Progress Notes (Signed)
HD Tx started    05/30/18 1219  Vital Signs  Pulse Rate (!) 57  Pulse Rate Source Monitor  Resp (!) 21  BP (!) 168/74  BP Location Right Arm  BP Method Automatic  Patient Position (if appropriate) Lying  Oxygen Therapy  SpO2 95 %  O2 Device Nasal Cannula  O2 Flow Rate (L/min) 2 L/min  Pulse Oximetry Type Continuous  During Hemodialysis Assessment  Blood Flow Rate (mL/min) 400 mL/min  Arterial Pressure (mmHg) -210 mmHg  Venous Pressure (mmHg) 190 mmHg  Transmembrane Pressure (mmHg) 50 mmHg  Ultrafiltration Rate (mL/min) 340 mL/min  Dialysate Flow Rate (mL/min) 800 ml/min  Conductivity: Machine  14.1  HD Safety Checks Performed Yes  Dialysis Fluid Bolus Normal Saline  Bolus Amount (mL) 250 mL  Intra-Hemodialysis Comments Tx initiated  Fistula / Graft Left Upper arm Arteriovenous fistula  Placement Date/Time: 05/30/18 0056   Orientation: Left  Access Location: Upper arm  Access Type: Arteriovenous fistula  Status Accessed  Needle Size 15g  Drainage Description None

## 2018-05-30 NOTE — Progress Notes (Signed)
Post HD assessment . Pt tolerated tx well without c/o or complication. HD tx terminated early r/t clotting, MD aware, 41 minutes of tx time remaining. Net UF 609, goal met.    05/30/18 1532  Vital Signs  Temp 98.2 F (36.8 C)  Temp Source Oral  Pulse Rate 61  Pulse Rate Source Monitor  Resp 14  BP 128/65  BP Location Right Arm  BP Method Automatic  Patient Position (if appropriate) Lying  Oxygen Therapy  SpO2 100 %  O2 Device Nasal Cannula  O2 Flow Rate (L/min) 2 L/min  Dialysis Weight  Weight 117.4 kg  Type of Weight Post-Dialysis  Post-Hemodialysis Assessment  Rinseback Volume (mL) 100 mL  KECN 57.6 V  Dialyzer Clearance Heavily streaked  Duration of HD Treatment -hour(s) 3 hour(s)  Hemodialysis Intake (mL) 350 mL  UF Total -Machine (mL) 959 mL  Net UF (mL) 609 mL  Tolerated HD Treatment Yes  AVG/AVF Arterial Site Held (minutes) 10 minutes  AVG/AVF Venous Site Held (minutes) 10 minutes  Education / Care Plan  Dialysis Education Provided Yes  Documented Education in Care Plan Yes  Fistula / Graft Left Upper arm Arteriovenous fistula  Placement Date/Time: 05/30/18 0056   Orientation: Left  Access Location: Upper arm  Access Type: Arteriovenous fistula  Site Condition No complications  Fistula / Graft Assessment Present;Thrill;Bruit  Status Deaccessed  Drainage Description None

## 2018-05-30 NOTE — Progress Notes (Signed)
Post HD assessment    05/30/18 1530  Neurological  Level of Consciousness Alert  Orientation Level Oriented X4  Respiratory  Respiratory Pattern Regular;Unlabored  Chest Assessment Chest expansion symmetrical  Cardiac  ECG Monitor Yes  Cardiac Rhythm NSR  Vascular  R Radial Pulse +2  L Radial Pulse +2  Edema Generalized  Integumentary  Integumentary (WDL) X  Skin Color Appropriate for ethnicity  Musculoskeletal  Musculoskeletal (WDL) X  Generalized Weakness Yes  Assistive Device None  GU Assessment  Genitourinary (WDL) X  Genitourinary Symptoms  (HD)  Psychosocial  Psychosocial (WDL) WDL

## 2018-05-30 NOTE — Progress Notes (Signed)
Pre HD Tx    05/30/18 1213  Vital Signs  Temp (!) 97.4 F (36.3 C)  Temp Source Oral  Pulse Rate (!) 57  Pulse Rate Source Monitor  Resp (!) 21  BP (!) 172/70  BP Location Right Arm  BP Method Automatic  Patient Position (if appropriate) Lying  Oxygen Therapy  SpO2 97 %  O2 Device Nasal Cannula  O2 Flow Rate (L/min) 2 L/min  Pulse Oximetry Type Continuous  Pain Assessment  Pain Scale 0-10  Pain Score 0  Dialysis Weight  Weight 117.6 kg  Type of Weight Pre-Dialysis  Time-Out for Hemodialysis  What Procedure? HD  Pt Identifiers(min of two) First/Last Name;MRN/Account#  Correct Site? Yes  Correct Side? Yes  Correct Procedure? Yes  Consents Verified? Yes  Rad Studies Available? Yes  Engineer, civil (consulting) Number 4  Station Number 4  UF/Alarm Test Passed  Conductivity: Meter 14  Conductivity: Machine  14  pH 7.6  Reverse Osmosis Main  Normal Saline Lot Number [id number]  Dialyzer Lot Number [id number]  Disposable Set Lot Number [id number]  Machine Temperature 98.6 F (37 C)  Musician and Audible Yes  Blood Lines Intact and Secured Yes  Pre Treatment Patient Checks  Vascular access used during treatment Graft  Hepatitis B Surface Antigen Results Negative  Date Hepatitis B Surface Antigen Drawn 06/28/17  Hepatitis B Surface Antibody 10  Date Hepatitis B Surface Antibody Drawn 06/28/17  Hemodialysis Consent Verified Yes  Hemodialysis Standing Orders Initiated Yes  ECG (Telemetry) Monitor On Yes  Prime Ordered Normal Saline  Length of  DialysisTreatment -hour(s) 3.5 Hour(s)  Dialysis Treatment Comments Na 140  Dialyzer Elisio 17H NR  Dialysate 3K, 2.5 Ca  Dialysis Anticoagulant None  Dialysate Flow Ordered 600  Blood Flow Rate Ordered 400 mL/min  Ultrafiltration Goal 0.5 Liters  Dialysis Blood Pressure Support Ordered Normal Saline  Education / Care Plan  Dialysis Education Provided Yes  Documented Education in Care Plan Yes  Fistula / Graft Left Upper  arm Arteriovenous fistula  Placement Date/Time: 05/30/18 0056   Orientation: Left  Access Location: Upper arm  Access Type: Arteriovenous fistula  Site Condition No complications  Fistula / Graft Assessment Present  Status Patent  Drainage Description None

## 2018-05-31 NOTE — Telephone Encounter (Signed)
Lmov for patient to call back She was seen in ED  Will need to schedule appointment with Dr Caryl Comes for this

## 2018-07-02 ENCOUNTER — Ambulatory Visit (INDEPENDENT_AMBULATORY_CARE_PROVIDER_SITE_OTHER): Payer: Medicare HMO

## 2018-07-02 DIAGNOSIS — I6310 Cerebral infarction due to embolism of unspecified precerebral artery: Secondary | ICD-10-CM

## 2018-07-04 NOTE — Progress Notes (Signed)
[facility]link Summary Report / Loop Recorder 

## 2018-07-21 LAB — CUP PACEART REMOTE DEVICE CHECK
Date Time Interrogation Session: 20191106163913
MDC IDC PG IMPLANT DT: 20190425

## 2018-07-31 ENCOUNTER — Encounter: Payer: Medicare HMO | Admitting: Internal Medicine

## 2018-07-31 ENCOUNTER — Encounter

## 2018-08-01 ENCOUNTER — Encounter: Payer: Self-pay | Admitting: Internal Medicine

## 2018-08-06 ENCOUNTER — Ambulatory Visit (INDEPENDENT_AMBULATORY_CARE_PROVIDER_SITE_OTHER): Payer: Medicare HMO

## 2018-08-06 DIAGNOSIS — I6310 Cerebral infarction due to embolism of unspecified precerebral artery: Secondary | ICD-10-CM | POA: Diagnosis not present

## 2018-08-07 NOTE — Progress Notes (Signed)
[facility]link Summary Report / Loop Recorder 

## 2018-08-09 LAB — CUP PACEART REMOTE DEVICE CHECK
Date Time Interrogation Session: 20200111174001
MDC IDC PG IMPLANT DT: 20190425

## 2018-08-12 ENCOUNTER — Emergency Department
Admission: EM | Admit: 2018-08-12 | Discharge: 2018-08-12 | Disposition: A | Discharge status: Home / Self Care | Payer: Medicare HMO | Attending: Emergency Medicine | Admitting: Emergency Medicine

## 2018-08-12 ENCOUNTER — Emergency Department: Payer: Medicare HMO

## 2018-08-12 ENCOUNTER — Other Ambulatory Visit: Payer: Self-pay

## 2018-08-12 DIAGNOSIS — E1122 Type 2 diabetes mellitus with diabetic chronic kidney disease: Secondary | ICD-10-CM | POA: Insufficient documentation

## 2018-08-12 DIAGNOSIS — Z7902 Long term (current) use of antithrombotics/antiplatelets: Secondary | ICD-10-CM | POA: Diagnosis not present

## 2018-08-12 DIAGNOSIS — Z79899 Other long term (current) drug therapy: Secondary | ICD-10-CM | POA: Insufficient documentation

## 2018-08-12 DIAGNOSIS — Z8673 Personal history of transient ischemic attack (TIA), and cerebral infarction without residual deficits: Secondary | ICD-10-CM | POA: Diagnosis not present

## 2018-08-12 DIAGNOSIS — R51 Headache: Secondary | ICD-10-CM | POA: Insufficient documentation

## 2018-08-12 DIAGNOSIS — N186 End stage renal disease: Secondary | ICD-10-CM | POA: Insufficient documentation

## 2018-08-12 DIAGNOSIS — J449 Chronic obstructive pulmonary disease, unspecified: Secondary | ICD-10-CM | POA: Diagnosis not present

## 2018-08-12 DIAGNOSIS — I120 Hypertensive chronic kidney disease with stage 5 chronic kidney disease or end stage renal disease: Secondary | ICD-10-CM | POA: Insufficient documentation

## 2018-08-12 DIAGNOSIS — R519 Headache, unspecified: Secondary | ICD-10-CM

## 2018-08-12 DIAGNOSIS — Z87891 Personal history of nicotine dependence: Secondary | ICD-10-CM | POA: Insufficient documentation

## 2018-08-12 DIAGNOSIS — Z992 Dependence on renal dialysis: Secondary | ICD-10-CM | POA: Diagnosis not present

## 2018-08-12 DIAGNOSIS — Z794 Long term (current) use of insulin: Secondary | ICD-10-CM | POA: Diagnosis not present

## 2018-08-12 LAB — CBC WITH DIFFERENTIAL/PLATELET
ABS IMMATURE GRANULOCYTES: 0.04 10*3/uL (ref 0.00–0.07)
BASOS PCT: 1 %
Basophils Absolute: 0.1 10*3/uL (ref 0.0–0.1)
EOS ABS: 0.1 10*3/uL (ref 0.0–0.5)
EOS PCT: 1 %
HCT: 37.4 % (ref 36.0–46.0)
Hemoglobin: 11.4 g/dL — ABNORMAL LOW (ref 12.0–15.0)
Immature Granulocytes: 1 %
Lymphocytes Relative: 23 %
Lymphs Abs: 1.8 10*3/uL (ref 0.7–4.0)
MCH: 27.3 pg (ref 26.0–34.0)
MCHC: 30.5 g/dL (ref 30.0–36.0)
MCV: 89.5 fL (ref 80.0–100.0)
MONO ABS: 0.7 10*3/uL (ref 0.1–1.0)
Monocytes Relative: 9 %
NEUTROS ABS: 5.2 10*3/uL (ref 1.7–7.7)
Neutrophils Relative %: 65 %
PLATELETS: 198 10*3/uL (ref 150–400)
RBC: 4.18 MIL/uL (ref 3.87–5.11)
RDW: 15.4 % (ref 11.5–15.5)
WBC: 7.9 10*3/uL (ref 4.0–10.5)
nRBC: 0 % (ref 0.0–0.2)

## 2018-08-12 LAB — BASIC METABOLIC PANEL
Anion gap: 11 (ref 5–15)
BUN: 37 mg/dL — ABNORMAL HIGH (ref 8–23)
CALCIUM: 8.6 mg/dL — AB (ref 8.9–10.3)
CO2: 26 mmol/L (ref 22–32)
CREATININE: 9.76 mg/dL — AB (ref 0.44–1.00)
Chloride: 100 mmol/L (ref 98–111)
GFR calc Af Amer: 4 mL/min — ABNORMAL LOW (ref >60)
GFR, EST NON AFRICAN AMERICAN: 4 mL/min — AB (ref >60)
GLUCOSE: 175 mg/dL — AB (ref 70–99)
Potassium: 4.5 mmol/L (ref 3.5–5.1)
SODIUM: 137 mmol/L (ref 135–145)

## 2018-08-12 LAB — SEDIMENTATION RATE: SED RATE: 35 mm/h — AB (ref 0–30)

## 2018-08-12 LAB — CUP PACEART REMOTE DEVICE CHECK
Date Time Interrogation Session: 20191209170538
MDC IDC PG IMPLANT DT: 20190425

## 2018-08-12 MED ORDER — BUTALBITAL-APAP-CAFFEINE 50-325-40 MG PO TABS
2.0000 | ORAL_TABLET | Freq: Once | ORAL | Status: AC
  Administered 2018-08-12: 2 via ORAL
  Filled 2018-08-12: qty 2

## 2018-08-12 MED ORDER — BUTALBITAL-APAP-CAFFEINE 50-325-40 MG PO TABS: 1.0000 | ORAL_TABLET | ORAL | 0 refills | Status: DC | PRN

## 2018-08-12 NOTE — ED Provider Notes (Signed)
[facility] Emergency Department Provider Note  ____________________________________________   First MD Initiated Contact with Patient 08/12/18 0831     (approximate)  I have reviewed the triage vital signs and the nursing notes.   HISTORY  Chief Complaint Headache   HPI Cynthia Dean is a 68 y.o. female with a history of CVA, COPD as well as diabetes and hypertension was presented to emergency department today with a left-sided temporal headache over the past 2 weeks.  She says that the pain is a 10 out of 10 and worsened with leaning over.  She denies any runny nose.  Says that she is not having any nausea or vomiting.  Says that when she looks a bit light she sees brighter flashing lights.  Has tried Tylenol at home without relief.  Was prescribed Augmentin and Flonase by her primary care doctor also without relief.  [name] that she had a similar presentation in 2019 when she was diagnosed with a stroke.  However at that time she said that she had nausea and vomiting which she does not have now.  No focal weakness or numbness reported.  No vision loss.  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   Past Medical History:  Diagnosis Date  . COPD (chronic obstructive pulmonary disease) ([place])   . Coronary artery disease   . Diabetes mellitus without complication ([place])   . ESRD on dialysis ([place])   . Heart murmur   . Hyperlipidemia   . Hypertension   . Stroke [facility] [place])     Patient Active Problem List   Diagnosis Date Noted  . Nausea & vomiting 05/30/2018  . Nausea and vomiting 05/29/2018  . Partial seizure ([place]) 03/14/2018  . Stroke ([place]) 03/07/2018  . Slurred speech 11/14/2017  . Diabetes ([place]) 11/12/2017  . CAD (coronary artery disease) 11/12/2017  . COPD (chronic obstructive pulmonary disease) ([place]) 11/12/2017  . ESRD on dialysis ([place]) 11/12/2017  . Hematemesis 06/27/2017  . CVA (cerebral  vascular accident) ([place]) 04/14/2016  . Essential hypertension, benign 11/27/2015  . Hyperlipidemia 11/27/2015  . Obesity 11/27/2015  . Prolonged Q-T interval on ECG 11/26/2015  . Intractable nausea and vomiting 08/08/2015    Past Surgical History:  Procedure Laterality Date  . A/V FISTULAGRAM Left 12/20/2016   Procedure: A/V Fistulagram;  Surgeon: Katha Cabal, MD;  Location: [place] [facility];  Service: Cardiovascular;  Laterality: Left;  . A/V SHUNT INTERVENTION N/A 12/20/2016   Procedure: A/V Shunt Intervention;  Surgeon: Katha Cabal, MD;  Location: [place] [facility];  Service: Cardiovascular;  Laterality: N/A;  . A/V SHUNTOGRAM Left 09/11/2017   Procedure: A/V SHUNTOGRAM;  Surgeon: Algernon Huxley, MD;  Location: [place] [facility];  Service: Cardiovascular;  Laterality: Left;  . BREAST BIOPSY Bilateral 07/19/00   neg  . LOOP RECORDER INSERTION N/A 11/16/2017   Procedure: LOOP RECORDER INSERTION;  Surgeon: Deboraha Sprang, MD;  Location: [place] [facility];  Service: Cardiovascular;  Laterality: N/A;  . PERIPHERAL VASCULAR CATHETERIZATION Left 02/02/2015   Procedure: A/V Shuntogram/Fistulagram;  Surgeon: Algernon Huxley, MD;  Location: [place] [facility];  Service: Cardiovascular;  Laterality: Left;  . PERIPHERAL VASCULAR CATHETERIZATION Left 02/02/2015   Procedure: A/V Shunt Intervention;  Surgeon: Algernon Huxley, MD;  Location: [place] [facility];  Service: Cardiovascular;  Laterality: Left;  . PERIPHERAL VASCULAR CATHETERIZATION Left 03/09/2015   Procedure: A/V Shuntogram/Fistulagram;  Surgeon: Erskine Squibb  Lucky Cowboy, MD;  Location: [place] [facility];  Service: Cardiovascular;  Laterality: Left;  . PERIPHERAL VASCULAR CATHETERIZATION N/A 03/09/2015   Procedure: A/V Shunt Intervention;  Surgeon: Algernon Huxley, MD;  Location: [place] [facility];  Service: Cardiovascular;  Laterality: N/A;  . PERIPHERAL VASCULAR CATHETERIZATION Left 04/17/2015   Procedure: Upper Extremity  Angiography;  Surgeon: Algernon Huxley, MD;  Location: [place] [facility];  Service: Cardiovascular;  Laterality: Left;  . PERIPHERAL VASCULAR CATHETERIZATION  04/17/2015   Procedure: Upper Extremity Intervention;  Surgeon: Algernon Huxley, MD;  Location: [place] [facility];  Service: Cardiovascular;;  . PERIPHERAL VASCULAR CATHETERIZATION N/A 08/10/2015   Procedure: A/V Shuntogram/Fistulagram;  Surgeon: Algernon Huxley, MD;  Location: [place] [facility];  Service: Cardiovascular;  Laterality: N/A;  . PERIPHERAL VASCULAR CATHETERIZATION N/A 08/10/2015   Procedure: A/V Shunt Intervention;  Surgeon: Algernon Huxley, MD;  Location: [place] [facility];  Service: Cardiovascular;  Laterality: N/A;  . PERIPHERAL VASCULAR CATHETERIZATION Left 04/11/2016   Procedure: A/V Shuntogram/Fistulagram;  Surgeon: Algernon Huxley, MD;  Location: [place] [facility];  Service: Cardiovascular;  Laterality: Left;  . TEE WITHOUT CARDIOVERSION N/A 11/16/2017   Procedure: TRANSESOPHAGEAL ECHOCARDIOGRAM (TEE);  Surgeon: Minna Merritts, MD;  Location: [facility];  Service: Cardiovascular;  Laterality: N/A;    Prior to Admission medications   Medication Sig Start Date End Date Taking? Authorizing Provider  acetaminophen (TYLENOL) 325 MG tablet Take 2 tablets (650 mg total) by mouth every 4 (four) hours as needed for mild pain (or temp > 37.5 C (99.5 F)). 11/17/17   Nicholes Mango, MD  albuterol-ipratropium (COMBIVENT) 18-103 MCG/ACT inhaler Inhale 2 puffs into the lungs 2 (two) times daily.    [provider]  atorvastatin (LIPITOR) 80 MG tablet Take 80 mg by mouth every evening.     [provider]  budesonide-formoterol (SYMBICORT) 160-4.5 MCG/ACT inhaler Inhale 2 puffs into the lungs 2 (two) times daily. Patient not taking: Reported on 05/29/2018 07/01/17   Loletha Grayer, MD  calcium acetate (PHOSLO) 667 MG capsule Take 2,001 mg by mouth 3 (three) times daily with meals.     [provider]  cinacalcet  (SENSIPAR) 60 MG tablet Take 60 mg by mouth daily.    [provider]  clopidogrel (PLAVIX) 75 MG tablet TAKE ONE TABLET BY MOUTH ONCE DAILY Patient taking differently: Take 75 mg by mouth daily.  06/14/16   Stegmayer, Joelene Millin A, PA-C  gabapentin (NEURONTIN) 100 MG capsule Take 100 mg by mouth 3 (three) times daily.    [provider]  insulin NPH-regular Human (NOVOLIN 70/30) (70-30) 100 UNIT/ML injection Inject 25 Units into the skin 2 (two) times daily with a meal. Except dialysis days. Do not take on Tuesdays, Thursdays, and Saturdays. 11/17/17   Gouru, Illene Silver, MD  ipratropium-albuterol (DUONEB) 0.5-2.5 (3) MG/3ML SOLN Take 3 mLs by nebulization every 6 (six) hours as needed (for shortness of breath/ wheezing).     [provider]  levETIRAcetam (KEPPRA) 1000 MG tablet Take 1 tablet (1,000 mg total) by mouth daily. 03/15/18   Vaughan Basta, MD  levETIRAcetam (KEPPRA) 250 MG tablet Take 1 tablet (250 mg total) by mouth 3 (three) times a week. Take after dialysis on Monday, wed and Friday in addition to regular dose. 03/14/18   Vaughan Basta, MD  lidocaine-prilocaine (EMLA) cream Apply 1 application topically as needed (for port access).     [provider]  losartan (COZAAR) 100 MG tablet Take  100 mg by mouth daily. 09/12/17   [provider]  metoprolol succinate (TOPROL-XL) 100 MG 24 hr tablet Take 100 mg by mouth every evening. Take with or immediately following a meal.     [provider]  omeprazole (PRILOSEC) 20 MG capsule Take 20 mg by mouth 2 (two) times daily.     [provider]  polyethylene glycol (MIRALAX / GLYCOLAX) packet Take 17 g by mouth daily as needed for moderate constipation. 11/17/17   Nicholes Mango, MD  torsemide (DEMADEX) 100 MG tablet Take 100 mg by mouth daily. 10/10/17   [provider]    Allergies Tape  Family History  Problem Relation Age of Onset  . Breast cancer Father 71  .  Stroke Mother   . Heart attack Mother   . Heart Problems Sister   . Breast cancer Cousin 51       1 st cousin. Maternal     Social History Social History   Tobacco Use  . Smoking status: Former Smoker    Packs/day: 0.00    Types: Cigarettes  . Smokeless tobacco: Never Used  Substance Use Topics  . Alcohol use: No  . Drug use: No    Review of Systems  Constitutional: No fever/chills Eyes: No visual changes. ENT: No sore throat. Cardiovascular: Denies chest pain. Respiratory: Denies shortness of breath. Gastrointestinal: No abdominal pain.  No nausea, no vomiting.  No diarrhea.  No constipation. Genitourinary: Negative for dysuria. Musculoskeletal: Negative for back pain. Skin: Negative for rash. Neurological: Negative for headaches, focal weakness or numbness.   ____________________________________________   PHYSICAL EXAM:  VITAL SIGNS: ED Triage Vitals  Enc Vitals Group     BP 08/12/18 0813 (!) 170/67     Pulse Rate 08/12/18 0813 67     Resp 08/12/18 0813 20     Temp 08/12/18 0813 98.1 F (36.7 C)     Temp Source 08/12/18 0813 Oral     SpO2 08/12/18 0813 94 %     Weight 08/12/18 0813 230 lb (104.3 kg)     Height 08/12/18 0813 5\' 7"  (1.702 m)     Head Circumference --      Peak Flow --      Pain Score 08/12/18 0819 10     Pain Loc --      Pain Edu? --      Excl. in [place]? --     Constitutional: Alert and oriented. Well appearing and in no acute distress. Eyes: Conjunctivae are normal.  Head: Atraumatic.  No tenderness or nodularity along the distribution of the temporal arteries.  No jaw claudication.  Able to open and close the mouth to 3 finger breaths without issue. Nose: No congestion/rhinnorhea. Mouth/Throat: Mucous membranes are moist.  Neck: No stridor.   Cardiovascular: Normal rate, regular rhythm. Grossly normal heart sounds.  Palpable thrill over the left upper extremity fistula. Respiratory: Normal respiratory effort.  No retractions. Lungs  CTAB. Gastrointestinal: Soft and nontender. No distention.  Musculoskeletal: No lower extremity tenderness nor edema.  No joint effusions. Neurologic:  Normal speech and language. No gross focal neurologic deficits are appreciated. Skin:  Skin is warm, dry and intact. No rash noted. Psychiatric: Mood and affect are normal. Speech and behavior are normal.  ____________________________________________   LABS (all labs ordered are listed, but only abnormal results are displayed)  Labs Reviewed  CBC WITH DIFFERENTIAL/PLATELET - Abnormal; Notable for the following components:      Result Value   Hemoglobin  11.4 (*)    All other components within normal limits  BASIC METABOLIC PANEL - Abnormal; Notable for the following components:   Glucose, Bld 175 (*)    BUN 37 (*)    Creatinine, Ser 9.76 (*)    Calcium 8.6 (*)    GFR calc non Af Amer 4 (*)    GFR calc Af Amer 4 (*)    All other components within normal limits  SEDIMENTATION RATE - Abnormal; Notable for the following components:   Sed Rate 35 (*)    All other components within normal limits   ____________________________________________  EKG   ____________________________________________  RADIOLOGY  CT head with no evidence of acute intracranial abnormality.  Stable bifrontal and left parietal encephalomalacia.  Small stable bilateral cerebellar hemisphere infarcts. ____________________________________________   PROCEDURES  Procedure(s) performed:   Procedures  Critical Care performed:   ____________________________________________   INITIAL IMPRESSION / ASSESSMENT AND PLAN / ED COURSE  Pertinent labs & imaging results that were available during my care of the patient were reviewed by me and considered in my medical decision making (see chart for details).  Differential diagnosis includes, but is not limited to, intracranial hemorrhage, meningitis/encephalitis, previous head trauma, cavernous venous thrombosis,  tension headache, temporal arteritis, migraine or migraine equivalent, idiopathic intracranial hypertension, and non-specific headache. As part of my medical decision making, I reviewed the following data within the electronic MEDICAL RECORD NUMBER Notes from prior ED visits  ----------------------------------------- 10:31 AM on 08/12/2018 -----------------------------------------  Patient at this time feeling improved but still says she has pain the left side of her head.  States that the bright lights are not bothering her as much and that her pain is now an 8 out of 10.  She appeared to be resting without distress when in the room with her eyes closed.  We discussed possible further work-up including an MRI.  However, after 2 weeks of symptoms I believe we would see a new stroke.  Patient with a nonfocal neurologic exam.  Patient said that she would prefer to be discharged home with a prescription for Fioricet since she is feeling better with this medication.  We discussed that if she continues to feel better that she may follow-up as an outpatient with her primary care doctor as well as neurology.  However, if there are any worsening or concerning symptoms especially worsening headache or weakness that she must return to the emergency department immediately for further evaluation.  The patient is understanding of this diagnosis well treatment and willing to comply.  Also discussed with the husband at the bedside.  Slightly elevated sedimentation rate.  However, otherwise no other symptoms of giant cell arteritis.  No vision loss.  More likely to be alternative diagnosis. ____________________________________________   FINAL CLINICAL IMPRESSION(S) / ED DIAGNOSES  Headache.  NEW MEDICATIONS STARTED DURING THIS VISIT:  New Prescriptions   No medications on file     Note:  This document was prepared using Dragon voice recognition software and may include unintentional dictation errors.       Orbie Pyo, MD 08/12/18 1034

## 2018-08-12 NOTE — ED Triage Notes (Signed)
L temporal HA x 2 weeks. Speaking in clear complete sentences. No distress noted. In wheelchair.

## 2018-09-06 ENCOUNTER — Ambulatory Visit (INDEPENDENT_AMBULATORY_CARE_PROVIDER_SITE_OTHER): Payer: Medicare HMO

## 2018-09-06 DIAGNOSIS — I6310 Cerebral infarction due to embolism of unspecified precerebral artery: Secondary | ICD-10-CM | POA: Diagnosis not present

## 2018-09-07 LAB — CUP PACEART REMOTE DEVICE CHECK
Implantable Pulse Generator Implant Date: 20190425
MDC IDC SESS DTM: 20200213173838

## 2018-09-18 NOTE — Progress Notes (Signed)
[facility]link Summary Report / Loop Recorder 

## 2018-10-03 ENCOUNTER — Encounter: Payer: Self-pay | Admitting: Emergency Medicine

## 2018-10-03 ENCOUNTER — Other Ambulatory Visit: Payer: Self-pay

## 2018-10-03 ENCOUNTER — Emergency Department: Payer: Medicare HMO

## 2018-10-03 ENCOUNTER — Emergency Department
Admission: EM | Admit: 2018-10-03 | Discharge: 2018-10-03 | Disposition: A | Discharge status: Home / Self Care | Payer: Medicare HMO | Attending: Emergency Medicine | Admitting: Emergency Medicine

## 2018-10-03 DIAGNOSIS — J449 Chronic obstructive pulmonary disease, unspecified: Secondary | ICD-10-CM | POA: Diagnosis not present

## 2018-10-03 DIAGNOSIS — Z992 Dependence on renal dialysis: Secondary | ICD-10-CM | POA: Diagnosis not present

## 2018-10-03 DIAGNOSIS — Z79899 Other long term (current) drug therapy: Secondary | ICD-10-CM | POA: Insufficient documentation

## 2018-10-03 DIAGNOSIS — Z794 Long term (current) use of insulin: Secondary | ICD-10-CM | POA: Diagnosis not present

## 2018-10-03 DIAGNOSIS — E1122 Type 2 diabetes mellitus with diabetic chronic kidney disease: Secondary | ICD-10-CM | POA: Diagnosis not present

## 2018-10-03 DIAGNOSIS — N186 End stage renal disease: Secondary | ICD-10-CM | POA: Insufficient documentation

## 2018-10-03 DIAGNOSIS — I120 Hypertensive chronic kidney disease with stage 5 chronic kidney disease or end stage renal disease: Secondary | ICD-10-CM | POA: Diagnosis not present

## 2018-10-03 DIAGNOSIS — Z8673 Personal history of transient ischemic attack (TIA), and cerebral infarction without residual deficits: Secondary | ICD-10-CM | POA: Insufficient documentation

## 2018-10-03 DIAGNOSIS — Z87891 Personal history of nicotine dependence: Secondary | ICD-10-CM | POA: Insufficient documentation

## 2018-10-03 DIAGNOSIS — R531 Weakness: Secondary | ICD-10-CM | POA: Insufficient documentation

## 2018-10-03 LAB — COMPREHENSIVE METABOLIC PANEL
ALK PHOS: 50 U/L (ref 38–126)
ALT: 15 U/L (ref 0–44)
ANION GAP: 11 (ref 5–15)
AST: 19 U/L (ref 15–41)
Albumin: 3.1 g/dL — ABNORMAL LOW (ref 3.5–5.0)
BILIRUBIN TOTAL: 0.8 mg/dL (ref 0.3–1.2)
BUN: 27 mg/dL — ABNORMAL HIGH (ref 8–23)
CALCIUM: 7.1 mg/dL — AB (ref 8.9–10.3)
CO2: 18 mmol/L — AB (ref 22–32)
Chloride: 108 mmol/L (ref 98–111)
Creatinine, Ser: 4.89 mg/dL — ABNORMAL HIGH (ref 0.44–1.00)
GFR, EST AFRICAN AMERICAN: 10 mL/min — AB (ref >60)
GFR, EST NON AFRICAN AMERICAN: 9 mL/min — AB (ref >60)
Glucose, Bld: 156 mg/dL — ABNORMAL HIGH (ref 70–99)
Potassium: 4 mmol/L (ref 3.5–5.1)
SODIUM: 137 mmol/L (ref 135–145)
TOTAL PROTEIN: 6.2 g/dL — AB (ref 6.5–8.1)

## 2018-10-03 LAB — CBC
HEMATOCRIT: 39.9 % (ref 36.0–46.0)
HEMOGLOBIN: 12.1 g/dL (ref 12.0–15.0)
MCH: 28.5 pg (ref 26.0–34.0)
MCHC: 30.3 g/dL (ref 30.0–36.0)
MCV: 94.1 fL (ref 80.0–100.0)
Platelets: 163 10*3/uL (ref 150–400)
RBC: 4.24 MIL/uL (ref 3.87–5.11)
RDW: 13.7 % (ref 11.5–15.5)
WBC: 7.2 10*3/uL (ref 4.0–10.5)
nRBC: 0 % (ref 0.0–0.2)

## 2018-10-03 LAB — TROPONIN I: Troponin I: <0.03 ng/mL (ref <0.03)

## 2018-10-03 NOTE — ED Triage Notes (Signed)
Patient brought in via [facility] from home for c/o numbness and weakness and both upper extremities about 40 minutes ago. Now reports symptoms have resolved as she feels her normal. Hx of stroke with LEFT sided weakness.

## 2018-10-03 NOTE — ED Notes (Signed)
Patient transported to CT 

## 2018-10-03 NOTE — ED Notes (Signed)
Patient returned from CT scan, VSS. AOx4, with equal, unlabored respirations.

## 2018-10-03 NOTE — ED Provider Notes (Signed)
[facility] Emergency Department Provider Note   ____________________________________________    I have reviewed the triage vital signs and the nursing notes.   HISTORY  Chief Complaint Weakness     HPI Cynthia Dean is a 68 y.o. female who presents with complaints of weakness.  Patient reports she was undergoing dialysis today when she began feeling diffusely weak in both arms and both legs.  They stopped dialysis and now she feels much better.  She reports this is happened in the past as well during dialysis.  No headache, no numbness, no weakness currently.  Domino pain nausea or vomiting   Past Medical History:  Diagnosis Date  . COPD (chronic obstructive pulmonary disease) ([place])   . Coronary artery disease   . Diabetes mellitus without complication ([place])   . ESRD on dialysis ([place])   . Heart murmur   . Hyperlipidemia   . Hypertension   . Stroke [facility])     Patient Active Problem List   Diagnosis Date Noted  . Nausea & vomiting 05/30/2018  . Nausea and vomiting 05/29/2018  . Partial seizure ([place]) 03/14/2018  . Stroke ([place]) 03/07/2018  . Slurred speech 11/14/2017  . Diabetes ([place]) 11/12/2017  . CAD (coronary artery disease) 11/12/2017  . COPD (chronic obstructive pulmonary disease) ([place]) 11/12/2017  . ESRD on dialysis ([place]) 11/12/2017  . Hematemesis 06/27/2017  . CVA (cerebral vascular accident) ([place]) 04/14/2016  . Essential hypertension, benign 11/27/2015  . Hyperlipidemia 11/27/2015  . Obesity 11/27/2015  . Prolonged Q-T interval on ECG 11/26/2015  . Intractable nausea and vomiting 08/08/2015    Past Surgical History:  Procedure Laterality Date  . A/V FISTULAGRAM Left 12/20/2016   Procedure: A/V Fistulagram;  Surgeon: Katha Cabal, MD;  Location: [place] [facility];  Service: Cardiovascular;  Laterality: Left;  . A/V SHUNT INTERVENTION N/A 12/20/2016   Procedure: A/V Shunt Intervention;  Surgeon: Katha Cabal, MD;  Location: [place] [facility];  Service: Cardiovascular;  Laterality: N/A;  . A/V SHUNTOGRAM Left 09/11/2017   Procedure: A/V SHUNTOGRAM;  Surgeon: Algernon Huxley, MD;  Location: [place] [facility];  Service: Cardiovascular;  Laterality: Left;  . BREAST BIOPSY Bilateral 07/19/00   neg  . LOOP RECORDER INSERTION N/A 11/16/2017   Procedure: LOOP RECORDER INSERTION;  Surgeon: Deboraha Sprang, MD;  Location: [place] [facility];  Service: Cardiovascular;  Laterality: N/A;  . PERIPHERAL VASCULAR CATHETERIZATION Left 02/02/2015   Procedure: A/V Shuntogram/Fistulagram;  Surgeon: Algernon Huxley, MD;  Location: [place] [facility];  Service: Cardiovascular;  Laterality: Left;  . PERIPHERAL VASCULAR CATHETERIZATION Left 02/02/2015   Procedure: A/V Shunt Intervention;  Surgeon: Algernon Huxley, MD;  Location: [place] CV LAB;  Service: Cardiovascular;  Laterality: Left;  . PERIPHERAL VASCULAR CATHETERIZATION Left 03/09/2015   Procedure: A/V Shuntogram/Fistulagram;  Surgeon: Algernon Huxley, MD;  Location: [place] [facility];  Service: Cardiovascular;  Laterality: Left;  . PERIPHERAL VASCULAR CATHETERIZATION N/A 03/09/2015   Procedure: A/V Shunt Intervention;  Surgeon: Algernon Huxley, MD;  Location: [place] [facility];  Service: Cardiovascular;  Laterality: N/A;  . PERIPHERAL VASCULAR CATHETERIZATION Left 04/17/2015   Procedure: Upper Extremity Angiography;  Surgeon: Algernon Huxley, MD;  Location: [place] [facility];  Service: Cardiovascular;  Laterality: Left;  . PERIPHERAL VASCULAR CATHETERIZATION  04/17/2015   Procedure: Upper Extremity Intervention;  Surgeon: Algernon Huxley, MD;  Location: [place] [facility];  Service: Cardiovascular;;  . PERIPHERAL VASCULAR  CATHETERIZATION N/A 08/10/2015   Procedure: A/V Shuntogram/Fistulagram;  Surgeon: Algernon Huxley, MD;  Location: [place] [facility];  Service: Cardiovascular;  Laterality: N/A;  . PERIPHERAL VASCULAR CATHETERIZATION N/A 08/10/2015   Procedure: A/V Shunt  Intervention;  Surgeon: Algernon Huxley, MD;  Location: [place] [facility];  Service: Cardiovascular;  Laterality: N/A;  . PERIPHERAL VASCULAR CATHETERIZATION Left 04/11/2016   Procedure: A/V Shuntogram/Fistulagram;  Surgeon: Algernon Huxley, MD;  Location: [place] [facility];  Service: Cardiovascular;  Laterality: Left;  . TEE WITHOUT CARDIOVERSION N/A 11/16/2017   Procedure: TRANSESOPHAGEAL ECHOCARDIOGRAM (TEE);  Surgeon: Minna Merritts, MD;  Location: [facility];  Service: Cardiovascular;  Laterality: N/A;    Prior to Admission medications   Medication Sig Start Date End Date Taking? Authorizing Provider  albuterol-ipratropium (COMBIVENT) 18-103 MCG/ACT inhaler Inhale 2 puffs into the lungs 2 (two) times daily.   Yes [provider]  amLODipine (NORVASC) 5 MG tablet Take 5 mg by mouth 2 (two) times daily. 08/07/18  Yes [provider]  aspirin EC 325 MG tablet Take 325 mg by mouth daily. 05/29/18  Yes [provider]  atorvastatin (LIPITOR) 80 MG tablet Take 80 mg by mouth every evening.    Yes [provider]  calcium acetate (PHOSLO) 667 MG capsule Take 2,001 mg by mouth 3 (three) times daily with meals.    Yes [provider]  cinacalcet (SENSIPAR) 60 MG tablet Take 60 mg by mouth daily.   Yes [provider]  clopidogrel (PLAVIX) 75 MG tablet TAKE ONE TABLET BY MOUTH ONCE DAILY Patient taking differently: Take 75 mg by mouth daily.  06/14/16  Yes Stegmayer, Joelene Millin A, PA-C  fluticasone (FLONASE) 50 MCG/ACT nasal spray Place 2 sprays into both nostrils daily. 08/07/18 08/07/19 Yes [provider]  gabapentin (NEURONTIN) 100 MG capsule Take 100 mg by mouth 3 (three) times daily.   Yes [provider]  insulin NPH-regular Human (NOVOLIN 70/30) (70-30) 100 UNIT/ML injection Inject 25 Units into the skin 2 (two) times daily with a meal. Except dialysis days. Do not take on Tuesdays, Thursdays, and Saturdays. 11/17/17  Yes Gouru,  Aruna, MD  ipratropium-albuterol (DUONEB) 0.5-2.5 (3) MG/3ML SOLN Take 3 mLs by nebulization every 6 (six) hours as needed (for shortness of breath/ wheezing).    Yes [provider]  levETIRAcetam (KEPPRA) 1000 MG tablet Take 1 tablet (1,000 mg total) by mouth daily. 03/15/18  Yes Vaughan Basta, MD  levETIRAcetam (KEPPRA) 250 MG tablet Take 1 tablet (250 mg total) by mouth 3 (three) times a week. Take after dialysis on Monday, wed and Friday in addition to regular dose. 03/14/18  Yes Vaughan Basta, MD  losartan (COZAAR) 100 MG tablet Take 100 mg by mouth daily. 09/12/17  Yes [provider]  metoprolol succinate (TOPROL-XL) 100 MG 24 hr tablet Take 100 mg by mouth every evening. Take with or immediately following a meal.    Yes [provider]  omeprazole (PRILOSEC) 20 MG capsule Take 20 mg by mouth 2 (two) times daily.    Yes [provider]  torsemide (DEMADEX) 100 MG tablet Take 100 mg by mouth daily. 10/10/17  Yes [provider]  acetaminophen (TYLENOL) 325 MG tablet Take 2 tablets (650 mg total) by mouth every 4 (four) hours as needed for mild pain (or temp > 37.5 C (99.5 F)). 11/17/17   Nicholes Mango, MD  budesonide-formoterol (SYMBICORT) 160-4.5 MCG/ACT inhaler Inhale 2 puffs into the lungs 2 (two) times daily.  Patient not taking: Reported on 05/29/2018 07/01/17   Loletha Grayer, MD  butalbital-acetaminophen-caffeine [place]) [phone] MG tablet Take 1 tablet by mouth every 4 (four) hours as needed for headache. 08/12/18 08/12/19  Orbie Pyo, MD  lidocaine-prilocaine (EMLA) cream Apply 1 application topically as needed (for port access).     [provider]  polyethylene glycol (MIRALAX / GLYCOLAX) packet Take 17 g by mouth daily as needed for moderate constipation. 11/17/17   Nicholes Mango, MD     Allergies Tape and Tapentadol  Family History  Problem Relation Age of Onset  . Breast cancer Father 2   . Stroke Mother   . Heart attack Mother   . Heart Problems Sister   . Breast cancer Cousin 73       1 st cousin. Maternal     Social History Social History   Tobacco Use  . Smoking status: Former Smoker    Packs/day: 0.00    Types: Cigarettes  . Smokeless tobacco: Never Used  Substance Use Topics  . Alcohol use: No  . Drug use: No    Review of Systems  Constitutional: No fever/chills Eyes: No visual changes.  ENT: No sore throat. Cardiovascular: Denies chest pain. Respiratory: Denies shortness of breath. Gastrointestinal: No abdominal pain.   Genitourinary: Negative for dysuria. Musculoskeletal: Negative for back pain. Skin: Negative for rash. Neurological: Negative for headaches    ____________________________________________   PHYSICAL EXAM:  VITAL SIGNS: ED Triage Vitals  Enc Vitals Group     BP 10/03/18 0943 (!) 116/59     Pulse Rate 10/03/18 0943 66     Resp 10/03/18 0943 16     Temp --      Temp src --      SpO2 10/03/18 0943 100 %     Weight --      Height --      Head Circumference --      Peak Flow --      Pain Score 10/03/18 0944 0     Pain Loc --      Pain Edu? --      Excl. in [place]? --     Constitutional: Alert and oriented.  Eyes: Conjunctivae are normal.   Nose: No congestion/rhinnorhea. Mouth/Throat: Mucous membranes are moist.    Cardiovascular: Normal rate, regular rhythm. Grossly normal heart sounds.  Good peripheral circulation. Respiratory: Normal respiratory effort.  No retractions. Lungs CTAB. Gastrointestinal: Soft and nontender. No distention.  Musculoskeletal:   Warm and well perfused Neurologic:  Normal speech and language. No gross focal neurologic deficits are appreciated.  Skin:  Skin is warm, dry and intact. No rash noted. Psychiatric: Mood and affect are normal. Speech and behavior are normal.  ____________________________________________   LABS (all labs ordered are listed, but only abnormal results are  displayed)  Labs Reviewed  COMPREHENSIVE METABOLIC PANEL - Abnormal; Notable for the following components:      Result Value   CO2 18 (*)    Glucose, Bld 156 (*)    BUN 27 (*)    Creatinine, Ser 4.89 (*)    Calcium 7.1 (*)    Total Protein 6.2 (*)    Albumin 3.1 (*)    GFR calc non Af Amer 9 (*)    GFR calc Af Amer 10 (*)    All other components within normal limits  CBC  TROPONIN I   ____________________________________________  EKG  ED ECG REPORT I, Lavonia Drafts, the attending physician, personally viewed  and interpreted this ECG.  Date: 10/03/2018  Rhythm: normal sinus rhythm QRS Axis: normal Intervals: normal ST/T Wave abnormalities: normal Narrative Interpretation: no evidence of acute ischemia  ____________________________________________  RADIOLOGY  CT head unremarkable ____________________________________________   PROCEDURES  Procedure(s) performed: No  Procedures   Critical Care performed: No ____________________________________________   INITIAL IMPRESSION / ASSESSMENT AND PLAN / ED COURSE  Pertinent labs & imaging results that were available during my care of the patient were reviewed by me and considered in my medical decision making (see chart for details).  Patient well-appearing and in no acute distress.  Symptoms have completely resolved.  Symptoms not consistent with CVA given bilateral nature.  This is occurred to her before and is likely related to dialysis.  Lab work is unremarkable here.  Discussed with the patient and offered admission but she feels quite well and is ready to go home I think this is appropriate, strict return precautions    ____________________________________________   FINAL CLINICAL IMPRESSION(S) / ED DIAGNOSES  Final diagnoses:  Generalized weakness        Note:  This document was prepared using Dragon voice recognition software and may include unintentional dictation errors.   Lavonia Drafts, MD  10/03/18 1525

## 2018-10-09 ENCOUNTER — Other Ambulatory Visit: Payer: Self-pay

## 2018-10-09 ENCOUNTER — Ambulatory Visit (INDEPENDENT_AMBULATORY_CARE_PROVIDER_SITE_OTHER): Payer: Medicare HMO | Admitting: *Deleted

## 2018-10-09 DIAGNOSIS — I6310 Cerebral infarction due to embolism of unspecified precerebral artery: Secondary | ICD-10-CM

## 2018-10-10 LAB — CUP PACEART REMOTE DEVICE CHECK
Date Time Interrogation Session: 20200317174052
Implantable Pulse Generator Implant Date: 20190425

## 2018-10-17 NOTE — Progress Notes (Signed)
[facility]link Summary Report / Loop Recorder 

## 2018-11-12 ENCOUNTER — Other Ambulatory Visit: Payer: Self-pay

## 2018-11-12 ENCOUNTER — Ambulatory Visit (INDEPENDENT_AMBULATORY_CARE_PROVIDER_SITE_OTHER): Payer: Medicare HMO | Admitting: *Deleted

## 2018-11-12 DIAGNOSIS — I6310 Cerebral infarction due to embolism of unspecified precerebral artery: Secondary | ICD-10-CM | POA: Diagnosis not present

## 2018-11-12 LAB — CUP PACEART REMOTE DEVICE CHECK
Date Time Interrogation Session: 20200419184208
Implantable Pulse Generator Implant Date: 20190425

## 2018-11-21 NOTE — Progress Notes (Signed)
[facility]link Summary Report / Loop Recorder 

## 2018-12-14 ENCOUNTER — Ambulatory Visit (INDEPENDENT_AMBULATORY_CARE_PROVIDER_SITE_OTHER): Payer: Medicare HMO | Admitting: *Deleted

## 2018-12-14 DIAGNOSIS — I6310 Cerebral infarction due to embolism of unspecified precerebral artery: Secondary | ICD-10-CM | POA: Diagnosis not present

## 2018-12-15 LAB — CUP PACEART REMOTE DEVICE CHECK
Date Time Interrogation Session: 20200522184140
Implantable Pulse Generator Implant Date: 20190425

## 2018-12-25 NOTE — Progress Notes (Signed)
[facility]link Summary Report / Loop Recorder 

## 2018-12-31 ENCOUNTER — Inpatient Hospital Stay
Admission: EM | Admit: 2018-12-31 | Discharge: 2019-01-02 | DRG: 291 | Disposition: A | Discharge status: Home / Self Care | Payer: Medicare Other | Attending: Internal Medicine | Admitting: Internal Medicine

## 2018-12-31 ENCOUNTER — Other Ambulatory Visit: Payer: Self-pay

## 2018-12-31 ENCOUNTER — Emergency Department: Payer: Medicare Other

## 2018-12-31 DIAGNOSIS — I2510 Atherosclerotic heart disease of native coronary artery without angina pectoris: Secondary | ICD-10-CM | POA: Diagnosis present

## 2018-12-31 DIAGNOSIS — J441 Chronic obstructive pulmonary disease with (acute) exacerbation: Secondary | ICD-10-CM | POA: Diagnosis not present

## 2018-12-31 DIAGNOSIS — G4089 Other seizures: Secondary | ICD-10-CM | POA: Diagnosis present

## 2018-12-31 DIAGNOSIS — I132 Hypertensive heart and chronic kidney disease with heart failure and with stage 5 chronic kidney disease, or end stage renal disease: Secondary | ICD-10-CM | POA: Diagnosis not present

## 2018-12-31 DIAGNOSIS — Z794 Long term (current) use of insulin: Secondary | ICD-10-CM | POA: Diagnosis not present

## 2018-12-31 DIAGNOSIS — Z7951 Long term (current) use of inhaled steroids: Secondary | ICD-10-CM | POA: Diagnosis not present

## 2018-12-31 DIAGNOSIS — Z888 Allergy status to other drugs, medicaments and biological substances status: Secondary | ICD-10-CM | POA: Diagnosis not present

## 2018-12-31 DIAGNOSIS — Z91048 Other nonmedicinal substance allergy status: Secondary | ICD-10-CM

## 2018-12-31 DIAGNOSIS — I5033 Acute on chronic diastolic (congestive) heart failure: Secondary | ICD-10-CM | POA: Diagnosis present

## 2018-12-31 DIAGNOSIS — Z7982 Long term (current) use of aspirin: Secondary | ICD-10-CM | POA: Diagnosis not present

## 2018-12-31 DIAGNOSIS — E8770 Fluid overload, unspecified: Secondary | ICD-10-CM

## 2018-12-31 DIAGNOSIS — E1151 Type 2 diabetes mellitus with diabetic peripheral angiopathy without gangrene: Secondary | ICD-10-CM | POA: Diagnosis not present

## 2018-12-31 DIAGNOSIS — I69320 Aphasia following cerebral infarction: Secondary | ICD-10-CM

## 2018-12-31 DIAGNOSIS — E785 Hyperlipidemia, unspecified: Secondary | ICD-10-CM | POA: Diagnosis present

## 2018-12-31 DIAGNOSIS — K921 Melena: Secondary | ICD-10-CM | POA: Diagnosis not present

## 2018-12-31 DIAGNOSIS — Z79899 Other long term (current) drug therapy: Secondary | ICD-10-CM | POA: Diagnosis not present

## 2018-12-31 DIAGNOSIS — E1122 Type 2 diabetes mellitus with diabetic chronic kidney disease: Secondary | ICD-10-CM | POA: Diagnosis not present

## 2018-12-31 DIAGNOSIS — Z7902 Long term (current) use of antithrombotics/antiplatelets: Secondary | ICD-10-CM

## 2018-12-31 DIAGNOSIS — R0602 Shortness of breath: Secondary | ICD-10-CM | POA: Diagnosis present

## 2018-12-31 DIAGNOSIS — Z8249 Family history of ischemic heart disease and other diseases of the circulatory system: Secondary | ICD-10-CM

## 2018-12-31 DIAGNOSIS — J9601 Acute respiratory failure with hypoxia: Secondary | ICD-10-CM

## 2018-12-31 DIAGNOSIS — Z87891 Personal history of nicotine dependence: Secondary | ICD-10-CM

## 2018-12-31 DIAGNOSIS — E875 Hyperkalemia: Secondary | ICD-10-CM | POA: Diagnosis not present

## 2018-12-31 DIAGNOSIS — Z823 Family history of stroke: Secondary | ICD-10-CM

## 2018-12-31 DIAGNOSIS — Z992 Dependence on renal dialysis: Secondary | ICD-10-CM | POA: Diagnosis not present

## 2018-12-31 DIAGNOSIS — Z20828 Contact with and (suspected) exposure to other viral communicable diseases: Secondary | ICD-10-CM | POA: Diagnosis not present

## 2018-12-31 DIAGNOSIS — Z803 Family history of malignant neoplasm of breast: Secondary | ICD-10-CM

## 2018-12-31 DIAGNOSIS — N186 End stage renal disease: Secondary | ICD-10-CM | POA: Diagnosis present

## 2018-12-31 HISTORY — DX: Acute respiratory failure with hypoxia: J96.01

## 2018-12-31 LAB — RENAL FUNCTION PANEL
Albumin: 3.5 g/dL (ref 3.5–5.0)
Anion gap: 14 (ref 5–15)
BUN: 58 mg/dL — ABNORMAL HIGH (ref 8–23)
CO2: 20 mmol/L — ABNORMAL LOW (ref 22–32)
Calcium: 8 mg/dL — ABNORMAL LOW (ref 8.9–10.3)
Chloride: 103 mmol/L (ref 98–111)
Creatinine, Ser: 9.71 mg/dL — ABNORMAL HIGH (ref 0.44–1.00)
GFR calc Af Amer: 4 mL/min — ABNORMAL LOW (ref >60)
GFR calc non Af Amer: 4 mL/min — ABNORMAL LOW (ref >60)
Glucose, Bld: 312 mg/dL — ABNORMAL HIGH (ref 70–99)
Phosphorus: 3.5 mg/dL (ref 2.5–4.6)
Potassium: 5.6 mmol/L — ABNORMAL HIGH (ref 3.5–5.1)
Sodium: 137 mmol/L (ref 135–145)

## 2018-12-31 LAB — URINALYSIS, COMPLETE (UACMP) WITH MICROSCOPIC
Bilirubin Urine: NEGATIVE
Glucose, UA: 150 mg/dL — AB
Ketones, ur: NEGATIVE mg/dL
Leukocytes,Ua: NEGATIVE
Nitrite: NEGATIVE
Protein, ur: 100 mg/dL — AB
RBC / HPF: >50 RBC/hpf — ABNORMAL HIGH (ref 0–5)
Specific Gravity, Urine: 1.01 (ref 1.005–1.030)
Squamous Epithelial / HPF: NONE SEEN (ref 0–5)
WBC, UA: NONE SEEN WBC/hpf (ref 0–5)
pH: 8 (ref 5.0–8.0)

## 2018-12-31 LAB — CBC
HCT: 29.8 % — ABNORMAL LOW (ref 36.0–46.0)
HCT: 28.7 % — ABNORMAL LOW (ref 36.0–46.0)
Hemoglobin: 9.2 g/dL — ABNORMAL LOW (ref 12.0–15.0)
Hemoglobin: 8.9 g/dL — ABNORMAL LOW (ref 12.0–15.0)
MCH: 27.6 pg (ref 26.0–34.0)
MCH: 27.7 pg (ref 26.0–34.0)
MCHC: 30.9 g/dL (ref 30.0–36.0)
MCHC: 31 g/dL (ref 30.0–36.0)
MCV: 89.5 fL (ref 80.0–100.0)
MCV: 89.4 fL (ref 80.0–100.0)
Platelets: 163 10*3/uL (ref 150–400)
Platelets: 155 10*3/uL (ref 150–400)
RBC: 3.33 MIL/uL — ABNORMAL LOW (ref 3.87–5.11)
RBC: 3.21 MIL/uL — ABNORMAL LOW (ref 3.87–5.11)
RDW: 14.5 % (ref 11.5–15.5)
RDW: 14.3 % (ref 11.5–15.5)
WBC: 9 10*3/uL (ref 4.0–10.5)
WBC: 11.9 10*3/uL — ABNORMAL HIGH (ref 4.0–10.5)
nRBC: 0 % (ref 0.0–0.2)
nRBC: 0 % (ref 0.0–0.2)

## 2018-12-31 LAB — GLUCOSE, CAPILLARY
Glucose-Capillary: 249 mg/dL — ABNORMAL HIGH (ref 70–99)
Glucose-Capillary: 221 mg/dL — ABNORMAL HIGH (ref 70–99)

## 2018-12-31 LAB — SARS CORONAVIRUS 2 BY RT PCR (HOSPITAL ORDER, PERFORMED IN ~~LOC~~ HOSPITAL LAB): SARS Coronavirus 2: NEGATIVE

## 2018-12-31 LAB — COMPREHENSIVE METABOLIC PANEL
ALT: 14 U/L (ref 0–44)
AST: 14 U/L — ABNORMAL LOW (ref 15–41)
Albumin: 3.7 g/dL (ref 3.5–5.0)
Alkaline Phosphatase: 137 U/L — ABNORMAL HIGH (ref 38–126)
Anion gap: 12 (ref 5–15)
BUN: 50 mg/dL — ABNORMAL HIGH (ref 8–23)
CO2: 23 mmol/L (ref 22–32)
Calcium: 8.2 mg/dL — ABNORMAL LOW (ref 8.9–10.3)
Chloride: 104 mmol/L (ref 98–111)
Creatinine, Ser: 8.72 mg/dL — ABNORMAL HIGH (ref 0.44–1.00)
GFR calc Af Amer: 5 mL/min — ABNORMAL LOW (ref >60)
GFR calc non Af Amer: 4 mL/min — ABNORMAL LOW (ref >60)
Glucose, Bld: 237 mg/dL — ABNORMAL HIGH (ref 70–99)
Potassium: 5.9 mmol/L — ABNORMAL HIGH (ref 3.5–5.1)
Sodium: 139 mmol/L (ref 135–145)
Total Bilirubin: 0.7 mg/dL (ref 0.3–1.2)
Total Protein: 7.4 g/dL (ref 6.5–8.1)

## 2018-12-31 LAB — BRAIN NATRIURETIC PEPTIDE: B Natriuretic Peptide: 528 pg/mL — ABNORMAL HIGH (ref 0.0–100.0)

## 2018-12-31 LAB — TROPONIN I: Troponin I: <0.03 ng/mL (ref <0.03)

## 2018-12-31 LAB — PROCALCITONIN: Procalcitonin: 0.38 ng/mL

## 2018-12-31 LAB — LACTIC ACID, PLASMA
Lactic Acid, Venous: 1.3 mmol/L (ref 0.5–1.9)
Lactic Acid, Venous: 2.1 mmol/L (ref 0.5–1.9)

## 2018-12-31 LAB — LIPASE, BLOOD: Lipase: 51 U/L (ref 11–51)

## 2018-12-31 LAB — MAGNESIUM: Magnesium: 1.8 mg/dL (ref 1.7–2.4)

## 2018-12-31 MED ORDER — ATORVASTATIN CALCIUM 80 MG PO TABS
80.0000 mg | ORAL_TABLET | Freq: Every evening | ORAL | Status: DC
  Administered 2018-12-31 – 2019-01-01 (×2): 80 mg via ORAL
  Filled 2018-12-31: qty 4
  Filled 2018-12-31 (×2): qty 1
  Filled 2018-12-31: qty 4
  Filled 2018-12-31: qty 1

## 2018-12-31 MED ORDER — LEVETIRACETAM 500 MG PO TABS
1000.0000 mg | ORAL_TABLET | Freq: Every day | ORAL | Status: DC
  Administered 2018-12-31 – 2019-01-02 (×3): 1000 mg via ORAL
  Filled 2018-12-31 (×3): qty 2

## 2018-12-31 MED ORDER — LIDOCAINE-PRILOCAINE 2.5-2.5 % EX CREA
1.0000 "application " | TOPICAL_CREAM | CUTANEOUS | Status: DC | PRN
  Filled 2018-12-31: qty 5

## 2018-12-31 MED ORDER — SODIUM CHLORIDE 0.9 % IV SOLN: 100.0000 mL | INTRAVENOUS | Status: DC | PRN

## 2018-12-31 MED ORDER — LIDOCAINE HCL (PF) 1 % IJ SOLN
5.0000 mL | INTRAMUSCULAR | Status: DC | PRN
  Filled 2018-12-31: qty 5

## 2018-12-31 MED ORDER — CLOPIDOGREL BISULFATE 75 MG PO TABS
75.0000 mg | ORAL_TABLET | Freq: Every day | ORAL | Status: DC
  Administered 2018-12-31 – 2019-01-02 (×3): 75 mg via ORAL
  Filled 2018-12-31 (×3): qty 1

## 2018-12-31 MED ORDER — AMLODIPINE BESYLATE 5 MG PO TABS
5.0000 mg | ORAL_TABLET | Freq: Two times a day (BID) | ORAL | Status: DC
  Administered 2018-12-31 – 2019-01-02 (×4): 5 mg via ORAL
  Filled 2018-12-31 (×5): qty 1

## 2018-12-31 MED ORDER — SODIUM CHLORIDE 0.9% FLUSH
3.0000 mL | Freq: Once | INTRAVENOUS | Status: AC
  Administered 2018-12-31: 3 mL via INTRAVENOUS

## 2018-12-31 MED ORDER — PENTAFLUOROPROP-TETRAFLUOROETH EX AERO
1.0000 "application " | INHALATION_SPRAY | CUTANEOUS | Status: DC | PRN
  Filled 2018-12-31: qty 30

## 2018-12-31 MED ORDER — IPRATROPIUM-ALBUTEROL 0.5-2.5 (3) MG/3ML IN SOLN
3.0000 mL | Freq: Once | RESPIRATORY_TRACT | Status: AC
  Administered 2018-12-31: 3 mL via RESPIRATORY_TRACT
  Filled 2018-12-31: qty 3

## 2018-12-31 MED ORDER — METHYLPREDNISOLONE SODIUM SUCC 125 MG IJ SOLR
125.0000 mg | Freq: Once | INTRAMUSCULAR | Status: AC
  Administered 2018-12-31: 125 mg via INTRAVENOUS
  Filled 2018-12-31: qty 2

## 2018-12-31 MED ORDER — CHLORHEXIDINE GLUCONATE CLOTH 2 % EX PADS
6.0000 | MEDICATED_PAD | Freq: Every day | CUTANEOUS | Status: DC
  Administered 2019-01-01 – 2019-01-02 (×2): 6 via TOPICAL

## 2018-12-31 MED ORDER — ALTEPLASE 2 MG IJ SOLR: 2.0000 mg | Freq: Once | INTRAMUSCULAR | Status: DC | PRN

## 2018-12-31 MED ORDER — FUROSEMIDE 10 MG/ML IJ SOLN
40.0000 mg | Freq: Once | INTRAMUSCULAR | Status: AC
  Administered 2018-12-31: 40 mg via INTRAVENOUS
  Filled 2018-12-31: qty 4

## 2018-12-31 MED ORDER — GABAPENTIN 100 MG PO CAPS
100.0000 mg | ORAL_CAPSULE | Freq: Three times a day (TID) | ORAL | Status: DC
  Administered 2018-12-31 – 2019-01-02 (×5): 100 mg via ORAL
  Filled 2018-12-31 (×6): qty 1

## 2018-12-31 MED ORDER — FUROSEMIDE 10 MG/ML IJ SOLN
INTRAMUSCULAR | Status: AC
  Filled 2018-12-31: qty 4

## 2018-12-31 MED ORDER — INSULIN ASPART 100 UNIT/ML ~~LOC~~ SOLN
0.0000 [IU] | Freq: Three times a day (TID) | SUBCUTANEOUS | Status: DC
  Administered 2018-12-31: 18:00:00 5 [IU] via SUBCUTANEOUS
  Administered 2019-01-01: 2 [IU] via SUBCUTANEOUS
  Administered 2019-01-01: 08:00:00 7 [IU] via SUBCUTANEOUS
  Filled 2018-12-31 (×3): qty 1

## 2018-12-31 MED ORDER — ASPIRIN EC 325 MG PO TBEC
325.0000 mg | DELAYED_RELEASE_TABLET | Freq: Every day | ORAL | Status: DC
  Administered 2018-12-31 – 2019-01-02 (×3): 325 mg via ORAL
  Filled 2018-12-31 (×3): qty 1

## 2018-12-31 MED ORDER — PANTOPRAZOLE SODIUM 40 MG PO TBEC
40.0000 mg | DELAYED_RELEASE_TABLET | Freq: Every day | ORAL | Status: DC
  Administered 2018-12-31 – 2019-01-02 (×3): 40 mg via ORAL
  Filled 2018-12-31 (×3): qty 1

## 2018-12-31 MED ORDER — LEVETIRACETAM 250 MG PO TABS
250.0000 mg | ORAL_TABLET | ORAL | Status: DC
  Administered 2018-12-31: 250 mg via ORAL
  Filled 2018-12-31 (×2): qty 1

## 2018-12-31 MED ORDER — METOPROLOL SUCCINATE ER 100 MG PO TB24
100.0000 mg | ORAL_TABLET | Freq: Every day | ORAL | Status: DC
  Administered 2018-12-31 – 2019-01-02 (×3): 100 mg via ORAL
  Filled 2018-12-31 (×3): qty 1

## 2018-12-31 MED ORDER — HEPARIN SODIUM (PORCINE) 1000 UNIT/ML DIALYSIS: 1000.0000 [IU] | INTRAMUSCULAR | Status: DC | PRN

## 2018-12-31 MED ORDER — LOSARTAN POTASSIUM 50 MG PO TABS
100.0000 mg | ORAL_TABLET | Freq: Every day | ORAL | Status: DC
  Administered 2018-12-31 – 2019-01-01 (×2): 100 mg via ORAL
  Filled 2018-12-31 (×2): qty 2

## 2018-12-31 MED ORDER — INSULIN ASPART 100 UNIT/ML ~~LOC~~ SOLN
0.0000 [IU] | Freq: Every day | SUBCUTANEOUS | Status: DC
  Administered 2018-12-31 – 2019-01-01 (×2): 2 [IU] via SUBCUTANEOUS
  Filled 2018-12-31 (×2): qty 1

## 2018-12-31 MED ORDER — ACETAMINOPHEN 325 MG PO TABS: 650.0000 mg | ORAL_TABLET | ORAL | Status: DC | PRN

## 2018-12-31 MED ORDER — TORSEMIDE 100 MG PO TABS
100.0000 mg | ORAL_TABLET | Freq: Every day | ORAL | Status: DC
  Administered 2018-12-31 – 2019-01-02 (×3): 100 mg via ORAL
  Filled 2018-12-31 (×3): qty 1

## 2018-12-31 MED ORDER — HEPARIN SODIUM (PORCINE) 5000 UNIT/ML IJ SOLN
5000.0000 [IU] | Freq: Three times a day (TID) | INTRAMUSCULAR | Status: DC
  Administered 2018-12-31 – 2019-01-02 (×4): 5000 [IU] via SUBCUTANEOUS
  Filled 2018-12-31 (×5): qty 1

## 2018-12-31 NOTE — Care Management Obs Status (Signed)
[place] NOTIFICATION   Patient Details  Name: Ryliegh Mcduffey MRN: [id number] Date of Birth: [birth year]   Medicare Observation Status Notification Given:  Yes    Shelbie Hutching, RN 12/31/2018, 12:06 PM

## 2018-12-31 NOTE — Progress Notes (Signed)
Pre HD Tx    12/31/18 1805  Hand-Off documentation  Report given to (Full Name) Beatris Ship, RN   Report received from (Full Name) Margarita Grizzle, RN   Vital Signs  Temp 98.1 F (36.7 C)  Temp Source Oral  Pulse Rate 88  Pulse Rate Source Monitor  Resp 20  BP 137/78  BP Location Right Arm  BP Method Automatic  Patient Position (if appropriate) Lying  Oxygen Therapy  SpO2 (!) 89 %  O2 Device Nasal Cannula  O2 Flow Rate (L/min) 3 L/min  Pulse Oximetry Type Continuous  Pain Assessment  Pain Scale 0-10  Pain Score 0  Dialysis Weight  Weight 133.8 kg  Type of Weight Pre-Dialysis  Time-Out for Hemodialysis  What Procedure? HD  Pt Identifiers(min of two) First/Last Name  Correct Site? Yes  Correct Side? Yes  Correct Procedure? Yes  Consents Verified? Yes  Rad Studies Available? Yes  Safety Precautions Reviewed? Yes  Engineer, civil (consulting) Number 3  Station Number 4  UF/Alarm Test Passed  Conductivity: Meter 14  Conductivity: Machine  14.1  pH 7.2  Reverse Osmosis Main  Normal Saline Lot Number [id number]  Dialyzer Lot Number [id number]  Disposable Set Lot Number [id number]  Machine Temperature 98.6 F (37 C)  Musician and Audible Yes  Pre Treatment Patient Checks  Vascular access used during treatment Graft  Hepatitis B Surface Antigen Results Negative  Date Hepatitis B Surface Antigen Drawn 07/09/19  Hepatitis B Surface Antibody  (>10)  Date Hepatitis B Surface Antibody Drawn 07/13/18  Hemodialysis Consent Verified Yes  Hemodialysis Standing Orders Initiated Yes  ECG (Telemetry) Monitor On Yes  Prime Ordered Normal Saline  Length of  DialysisTreatment -hour(s) 3.5 Hour(s)  Dialysis Treatment Comments Na 140  Dialyzer Elisio 17H NR  Dialysate 2K, 2.5 Ca  Dialysis Anticoagulant None  Blood Flow Rate Ordered 400 mL/min  Ultrafiltration Goal 2 Liters  Dialysis Blood Pressure Support Ordered Normal Saline  Education / Care Plan  Dialysis Education Provided Yes   Documented Education in Care Plan Yes

## 2018-12-31 NOTE — Progress Notes (Addendum)
Inpatient Diabetes Program Recommendations  AACE/ADA: New Consensus Statement on Inpatient Glycemic Control   Target Ranges:  Prepandial:   less than 140 mg/dL      Peak postprandial:   less than 180 mg/dL (1-2 hours)      Critically ill patients:  140 - 180 mg/dL   Results for Cynthia Dean, Cynthia Dean (MRN [id number]) as of 12/31/2018 13:47  Ref. Range 12/31/2018 08:18  Glucose Latest Ref Range: 70 - 99 mg/dL 237 (H)   Review of Glycemic Control  Diabetes history: DM2 Outpatient Diabetes medications: 70/30 25 units BID (per home med list - except dialysis days; do not take on Tuesday, Thursdays, or Saturdays) Current orders for Inpatient glycemic control: None  Inpatient Diabetes Program Recommendations:   Correction (SSI): Please consider ordering CBGs ACHS with Novolog 0-9 units TID with meals and Novolog 0-5 units QHS.  Insulin - Basal: Once Novolog correction scale is ordered and if glucose is consistently greater than 180 mg/dl, please consider ordering Lantus 10 units Q24H.  Thanks, Barnie Alderman, RN, MSN, CDE Diabetes Coordinator Inpatient Diabetes Program [phone] (Team Pager from 8am to 5pm)

## 2018-12-31 NOTE — ED Triage Notes (Addendum)
Pt c/o abd pain with diarrhea and SOB, sore throat since yesterday. Pt is disoriented to time, pt unable to provided HT WT or year.

## 2018-12-31 NOTE — H&P (Signed)
[facility] NAME: Cynthia Dean    MR#:  [id number]  DATE OF BIRTH:  01-Aug-1950  DATE OF ADMISSION:  12/31/2018  PRIMARY CARE PHYSICIAN: Lowella Bandy, MD   REQUESTING/REFERRING PHYSICIAN: Arta Silence, MD  CHIEF COMPLAINT:   Chief Complaint  Patient presents with   Abdominal Pain   Shortness of Breath   HISTORY OF PRESENT ILLNESS:  68 y.o. 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  She reports onset of symptoms since last week with worsening and over the last 2 days associated with sore throat, diarrhea and abdominal pain.  She did report small amount of blood in the stool as well.  No other associated symptoms of cough, chest pain, diaphoresis, dizziness, LOC, fevers or chills, nausea or vomiting.  Patient says she was supposed to go for dialysis today however she was not feeling well therefore did not get dialyzed today.  Due to worsening symptoms she presented to the emergency room for further evaluation.  On arrival to the ED, she was afebrile with blood pressure of 184/70 1 mm per Hg, HR 73 bpm, respirations 22, sats 97% on room air however she has been placed on 2 L via nasal cannula.  She was alert and oriented x2, with slight slurred speech otherwise no obvious focal neurologic deficit.  Initial labs revealed Hgb 9.2 HCT 29.8,K+ 5.9, glucose 237, BUN 50, creatinine 8.70, BNP 528, SARS Coronavirus 2 negative and UA negative for UTI.  Chest x-ray showed enlargement of cardiac silhouette with pulmonary vascular congestion and mild diffuse pulmonary edema.  Patient being admitted to hospitalist service for  further management.  PAST MEDICAL HISTORY:   Past Medical History:  Diagnosis Date   COPD (chronic obstructive pulmonary disease) ([place])    Coronary artery disease    Diabetes mellitus without complication ([place])    ESRD on dialysis ([place])    Heart murmur    Hyperlipidemia    Hypertension    Stroke [facility])     PAST SURGICAL HISTORY:   Past Surgical History:  Procedure Laterality Date   A/V FISTULAGRAM Left 12/20/2016   Procedure: A/V Fistulagram;  Surgeon: Katha Cabal, MD;  Location: [place] [facility];  Service: Cardiovascular;  Laterality: Left;   A/V SHUNT INTERVENTION N/A 12/20/2016   Procedure: A/V Shunt Intervention;  Surgeon: Katha Cabal, MD;  Location: [place] [facility];  Service: Cardiovascular;  Laterality: N/A;   A/V SHUNTOGRAM Left 09/11/2017   Procedure: A/V SHUNTOGRAM;  Surgeon: Algernon Huxley, MD;  Location: [place] [facility];  Service: Cardiovascular;  Laterality: Left;   BREAST BIOPSY Bilateral 07/19/00   neg   LOOP RECORDER INSERTION N/A 11/16/2017   Procedure: LOOP RECORDER INSERTION;  Surgeon: Deboraha Sprang, MD;  Location: [place] [facility];  Service: Cardiovascular;  Laterality: N/A;   PERIPHERAL VASCULAR CATHETERIZATION Left 02/02/2015   Procedure: A/V Shuntogram/Fistulagram;  Surgeon: Algernon Huxley, MD;  Location: [place] [facility];  Service: Cardiovascular;  Laterality: Left;   PERIPHERAL VASCULAR CATHETERIZATION Left 02/02/2015   Procedure: A/V Shunt Intervention;  Surgeon: Algernon Huxley, MD;  Location: [place] [facility];  Service: Cardiovascular;  Laterality: Left;  PERIPHERAL VASCULAR CATHETERIZATION Left 03/09/2015   Procedure: A/V Shuntogram/Fistulagram;  Surgeon: Algernon Huxley, MD;  Location: [place] [facility];  Service: Cardiovascular;  Laterality: Left;   PERIPHERAL VASCULAR CATHETERIZATION N/A 03/09/2015   Procedure: A/V Shunt Intervention;  Surgeon: Algernon Huxley, MD;  Location: [place] [facility];  Service:  Cardiovascular;  Laterality: N/A;   PERIPHERAL VASCULAR CATHETERIZATION Left 04/17/2015   Procedure: Upper Extremity Angiography;  Surgeon: Algernon Huxley, MD;  Location: [place] [facility];  Service: Cardiovascular;  Laterality: Left;   PERIPHERAL VASCULAR CATHETERIZATION  04/17/2015   Procedure: Upper Extremity Intervention;  Surgeon: Algernon Huxley, MD;  Location: [place] [facility];  Service: Cardiovascular;;   PERIPHERAL VASCULAR CATHETERIZATION N/A 08/10/2015   Procedure: A/V Shuntogram/Fistulagram;  Surgeon: Algernon Huxley, MD;  Location: [place] [facility];  Service: Cardiovascular;  Laterality: N/A;   PERIPHERAL VASCULAR CATHETERIZATION N/A 08/10/2015   Procedure: A/V Shunt Intervention;  Surgeon: Algernon Huxley, MD;  Location: [place] [facility];  Service: Cardiovascular;  Laterality: N/A;   PERIPHERAL VASCULAR CATHETERIZATION Left 04/11/2016   Procedure: A/V Shuntogram/Fistulagram;  Surgeon: Algernon Huxley, MD;  Location: [place] [facility];  Service: Cardiovascular;  Laterality: Left;   TEE WITHOUT CARDIOVERSION N/A 11/16/2017   Procedure: TRANSESOPHAGEAL ECHOCARDIOGRAM (TEE);  Surgeon: Minna Merritts, MD;  Location: [facility];  Service: Cardiovascular;  Laterality: N/A;    SOCIAL HISTORY:   Social History   Tobacco Use   Smoking status: Former Smoker    Packs/day: 0.00    Types: Cigarettes   Smokeless tobacco: Never Used  Substance Use Topics   Alcohol use: No    FAMILY HISTORY:   Family History  Problem Relation Age of Onset   Breast cancer Father 35   Stroke Mother    Heart attack Mother    Heart Problems Sister    Breast cancer Cousin 67       1 st cousin. Maternal     DRUG ALLERGIES:   Allergies  Allergen Reactions   Tape Itching    Skin Dermatitis/itching (tape adhesive) Skin Dermatitis/itching (tape adhesive)   Tapentadol Itching    Skin Dermatitis/itching (tape adhesive) Skin Dermatitis/itching (tape adhesive)    REVIEW OF SYSTEMS:    Review of Systems  Constitutional: Negative for chills, fever, malaise/fatigue and weight loss.  HENT: Negative for congestion, hearing loss and sore throat.   Eyes: Negative for blurred vision and double vision.  Respiratory: Positive for shortness of breath and wheezing. Negative for cough, hemoptysis and sputum production.   Cardiovascular: Positive for orthopnea. Negative for chest pain, palpitations and leg swelling.  Gastrointestinal: Positive for abdominal pain, blood in stool and diarrhea. Negative for nausea and vomiting.  Genitourinary: Negative for dysuria and urgency.  Musculoskeletal: Negative for myalgias.  Skin: Negative for rash.  Neurological: Positive for seizures. Negative for dizziness, sensory change, speech change, focal weakness and headaches.  Psychiatric/Behavioral: Negative for depression.   MEDICATIONS AT HOME:   Prior to Admission medications   Medication Sig Start Date End Date Taking? Authorizing Provider  acetaminophen (TYLENOL) 325 MG tablet Take 2 tablets (650 mg total) by mouth every 4 (four) hours as needed for mild pain (or temp > 37.5 C (99.5 F)). 11/17/17  Yes Gouru, Aruna, MD  amLODipine (NORVASC) 5 MG tablet Take 5 mg by mouth 2 (two) times daily. 08/07/18  Yes [provider]  aspirin EC 325 MG tablet Take 325 mg by mouth daily. 05/29/18  Yes [provider]  atorvastatin (LIPITOR) 80 MG tablet Take 80 mg by mouth every evening.    Yes [provider]  clopidogrel (PLAVIX) 75 MG tablet TAKE ONE TABLET BY MOUTH ONCE DAILY Patient taking differently: Take 75 mg by mouth daily.  06/14/16  Yes Stegmayer, Joelene Millin A, PA-C  gabapentin (NEURONTIN) 100 MG capsule Take 100 mg by mouth 3 (three) times daily.   Yes [provider]  insulin NPH-regular Human (NOVOLIN 70/30) (70-30) 100 UNIT/ML injection Inject 25 Units into the skin 2 (two) times daily with a meal. Except dialysis days. Do not take on Tuesdays, Thursdays, and  Saturdays. 11/17/17  Yes Gouru, Illene Silver, MD  levETIRAcetam (KEPPRA) 1000 MG tablet Take 1 tablet (1,000 mg total) by mouth daily. 03/15/18  Yes Vaughan Basta, MD  levETIRAcetam (KEPPRA) 250 MG tablet Take 1 tablet (250 mg total) by mouth 3 (three) times a week. Take after dialysis on Monday, wed and Friday in addition to regular dose. 03/14/18  Yes Vaughan Basta, MD  lidocaine-prilocaine (EMLA) cream Apply 1 application topically as needed (for port access).    Yes [provider]  losartan (COZAAR) 100 MG tablet Take 100 mg by mouth at bedtime.  09/12/17  Yes [provider]  metoprolol succinate (TOPROL-XL) 100 MG 24 hr tablet Take 100 mg by mouth daily. Take with or immediately following a meal.    Yes [provider]  omeprazole (PRILOSEC) 20 MG capsule Take 20 mg by mouth 2 (two) times daily.    Yes [provider]  torsemide (DEMADEX) 100 MG tablet Take 100 mg by mouth daily. 10/10/17  Yes [provider]      VITAL SIGNS:  Blood pressure (!) 195/79, pulse 74, resp. rate 20, height 5\' 9"  (1.753 m), weight 133.8 kg, SpO2 100 %.  PHYSICAL EXAMINATION:   Physical Exam  GENERAL:  68 y.o.-year-old patient lying in the bed with no acute distress.  EYES: Pupils equal, round, reactive to light and accommodation. No scleral icterus. Extraocular muscles intact.  HEENT: Head atraumatic, normocephalic. Oropharynx and nasopharynx clear.  NECK:  Supple, no jugular venous distention. No thyroid enlargement, no tenderness.  LUNGS: Decreased breath sounds bilaterally, mild wheezing with rales and rhonchi. No crepitation. No use of accessory muscles of respiration.  CARDIOVASCULAR: S1, S2 normal. murmurs, No rubs, or gallops.  ABDOMEN: Soft, nontender, nondistended. Bowel sounds present. No organomegaly or mass.  EXTREMITIES: No pedal edema, cyanosis, or clubbing. No rash or lesions. + pedal pulses MUSCULOSKELETAL: Normal bulk, and power was 5+  grip and elbow, knee, and ankle flexion and extension bilaterally.  NEUROLOGIC:Alert and oriented x 3. CN 2-12 intact.Mild dysarthria, mild expressive aphasia  Sensation to light touch and cold stimuli intact bilaterally. Finger to nose nl. Babinski is downgoing. DTR's (biceps, patellar, and achilles) 2+ and symmetric throughout. Gait not tested due to safety concern. PSYCHIATRIC: The patient is alert and oriented x 3.  SKIN: No obvious rash, lesion, or ulcer.   DATA REVIEWED:  LABORATORY PANEL:   CBC Recent Labs  Lab 12/31/18 0818  WBC 9.0  HGB 9.2*  HCT 29.8*  PLT 163   ------------------------------------------------------------------------------------------------------------------  Chemistries  Recent Labs  Lab 12/31/18 0818  NA 139  K 5.9*  CL 104  CO2 23  GLUCOSE 237*  BUN 50*  CREATININE 8.72*  CALCIUM 8.2*  MG 1.8  AST 14*  ALT 14  ALKPHOS 137*  BILITOT 0.7   ------------------------------------------------------------------------------------------------------------------  Cardiac Enzymes Recent Labs  Lab 12/31/18 0818  TROPONINI <0.03   ------------------------------------------------------------------------------------------------------------------  RADIOLOGY:  Dg Chest Portable 1 View  Result Date: 12/31/2018 CLINICAL DATA:  Abdominal pain with diarrhea, shortness of breath, sore throat since yesterday, history coronary artery disease, diabetes mellitus, hypertension, end-stage renal disease on dialysis, stroke EXAM: PORTABLE CHEST 1 VIEW COMPARISON:  Portable exam 0807 hours compared to 05/29/2018 FINDINGS: Loop recorder projects over cardiac silhouette. Enlargement of cardiac silhouette with pulmonary vascular congestion. Atherosclerotic calcification aorta. Interstitial infiltrates likely representing pulmonary edema. No gross pleural effusion or pneumothorax. No focal osseous lesions. IMPRESSION: Enlargement of cardiac silhouette with pulmonary  vascular congestion and mild diffuse pulmonary edema, question CHF versus fluid overload. Electronically Signed   By: Lavonia Dana M.D.   On: 12/31/2018 08:39    EKG:  EKG: normal EKG, normal sinus rhythm, unchanged from previous tracings. Vent. rate 80 BPM PR interval * ms QRS duration 94 ms QT/QTc 415/479 ms P-R-T axes 81 41 83  IMPRESSION AND PLAN:   68 y.o. 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  1.  Acute respiratory failure with hypoxia -likely secondary to fluid overload in a patient with history of ESRD missed dialysis today, COPD and diastolic heart failure. - Admit to MedSurg unit for observation - Chest x-ray reviewed and shows pulmonary edema received IV Lasix in ED - COVID-19 negative - Check procalcitonin  2.  Acute on chronic kidney disease - ESRD on dialysis- T-TH-S.  Missed dialysis today - BUN/Cr elevated from baseline - Nephrology consult for possible HD, message sent via haiku  3. Acute on chronic  diastolic congestive Heart Failure: Acute presentation likely due to volume overload with associated symptoms of SOB. BNP elevated at 528 - Chest x-ray shows pulmonary edema as above  Last Echo 02/2018 with  LV EF 55-60% - Beta-Blockade: Metoprolol - Diuretics: Torsemide. Diureses >1L negative per day until approach euvolemia / worsening renal function. - Echocardiogram  - Low salt diet  - Check daily weight - Strict I&Os  4. Hyperkalemia - No EKG changes, pending HD - Recheck in am  5. AECOPD - patient with hx of COPD  uses O2 as needed - Supplemental O2, goal sat 88-92% - DuoNebs every 6 hours   6. Diarrhea - presenting with abdominal pain  without associated n/v - Check C-Diff - Check GI panel  7. Hx of CVA with residual aphasia - Continue dual therapy Aspirin 81 mg/day and Plavix 75 mg /day   8. Coronary artery disease  - ASA 81mg  PO daily - Clopidogrel 75mg  PO daily  - HTN, HLD, DM control as below - Follows with cardiology Dr. Rockey Situ on outpatient basis    9. HLD + Goal LDL<70 - Atorvastatin 80mg  PO qhs   10. HTN- stable + Goal BP <130/80 - Beta-blocker: Metoprolol - Continue amlodipine - ACE-Inhibitor: Losartan   11.  DM - Insulin dependent at home - Start SSI - Diabetes educator to follow  DVT prophylaxis on heparin subcu.  All the records are reviewed and case discussed with ED provider. Management plans discussed with the patient, family and they are in agreement.  CODE STATUS: FULL  TOTAL TIME TAKING CARE OF THIS PATIENT: 45 minutes.    on 12/31/2018 at 11:59 AM  This  patient was staffed with Dr. Valetta Fuller, [facility]  who personally evaluated patient, reviewed documentation and agreed with assessment and plan of care as above.  Rufina Falco, DNP, FNP-BC [facility] Nurse Practitioner Between 7am to 6pm - Pager [phone]  After 6pm go to [email or web address] - password [email or web address] [facility]  Office  [phone]  CC: Primary care physician; Lowella Bandy, MD

## 2018-12-31 NOTE — Progress Notes (Signed)
Pre HD Assessment    12/31/18 1800  Neurological  Level of Consciousness Alert  Orientation Level Oriented X4  Respiratory  Respiratory Pattern Regular;Unlabored  Chest Assessment Chest expansion symmetrical  Bilateral Breath Sounds Diminished;Expiratory wheezes  Cough None  Cardiac  Pulse Regular  Heart Sounds S1, S2  ECG Monitor Yes  Cardiac Rhythm NSR  Vascular  R Radial Pulse +2  L Radial Pulse +2  Edema Generalized  Generalized Edema +1  Psychosocial  Psychosocial (WDL) WDL

## 2018-12-31 NOTE — Progress Notes (Signed)
HD Tx started w/o complication    94/44/61 1809  Vital Signs  Pulse Rate 84  Pulse Rate Source Monitor  Resp (!) 29  BP (!) 154/54  BP Location Right Arm  BP Method Automatic  Patient Position (if appropriate) Lying  Oxygen Therapy  SpO2 (!) 88 %  O2 Device Nasal Cannula  O2 Flow Rate (L/min) 3 L/min  Pulse Oximetry Type Continuous  During Hemodialysis Assessment  Blood Flow Rate (mL/min) 400 mL/min  Arterial Pressure (mmHg) -80 mmHg  Venous Pressure (mmHg) 180 mmHg  Transmembrane Pressure (mmHg) 70 mmHg  Ultrafiltration Rate (mL/min) 1000 mL/min  Dialysate Flow Rate (mL/min) 800 ml/min  Conductivity: Machine  13.6  HD Safety Checks Performed Yes  Dialysis Fluid Bolus Normal Saline  Bolus Amount (mL) 250 mL  Intra-Hemodialysis Comments Tx initiated

## 2018-12-31 NOTE — Progress Notes (Signed)
Post HD Assessment    12/31/18 2146  Neurological  Level of Consciousness Alert  Orientation Level Oriented X4  Respiratory  Respiratory Pattern Regular;Unlabored  Chest Assessment Chest expansion symmetrical  Bilateral Breath Sounds Diminished  Cough None  Cardiac  Pulse Regular  Heart Sounds S1, S2  ECG Monitor Yes  Cardiac Rhythm NSR  Vascular  R Radial Pulse +2  L Radial Pulse +2  Edema Generalized  Generalized Edema +1  Psychosocial  Psychosocial (WDL) WDL

## 2018-12-31 NOTE — TOC Initial Note (Signed)
Transition of Care [facility]) - Initial/Assessment Note    Patient Details  Name: Cynthia Dean MRN: [id number] Date of Birth: 04/24/1951  Transition of Care [facility]) CM/SW Contact:    Shelbie Hutching, RN Phone Number: 12/31/2018, 11:53 AM  Clinical Narrative:                 Patient is being seen in the emergency room for shortness of breath.  Patient is a dialysis patient MWF.  Elvera Bicker notified of admission.  Patient is from home and lives with her husband, she reports she is independent in ADL's, she does have a wheelchair.  Patient wears chronic O2 at 2L, O2 is from [employer].  Patient reports that [facility] provides transportation to and from dialysis.  Patient seems a little confused and is slow to answer questions but answers seem appropriate.  TOC team will cont to follow and assist with any discharge needs.    Expected Discharge Plan: Home/Self Care Barriers to Discharge: Continued Medical Work up   Patient Goals and CMS Choice Patient states their goals for this hospitalization and ongoing recovery are:: For her breathing to get better and to get something to eat      Expected Discharge Plan and Services Expected Discharge Plan: Home/Self Care       Living arrangements for the past 2 months: Single Family Home                                      Prior Living Arrangements/Services Living arrangements for the past 2 months: Single Family Home Lives with:: Spouse Patient language and need for interpreter reviewed:: No Do you feel safe going back to the place where you live?: Yes      Need for Family Participation in Patient Care: Yes (Comment)(Dialysis patient) Care giver support system in place?: Yes (comment)(husband and daughter) Current home services: DME(Oxygen and wheelchair) Criminal Activity/Legal Involvement Pertinent to Current Situation/Hospitalization: No - Comment as needed  Activities of Daily Living      Permission Sought/Granted                   Emotional Assessment Appearance:: Appears stated age Attitude/Demeanor/Rapport: Engaged Affect (typically observed): Accepting Orientation: : Oriented to Self, Oriented to Place, Oriented to Situation Alcohol / Substance Use: Not Applicable Psych Involvement: No (comment)  Admission diagnosis:  abd pain/sob Patient Active Problem List   Diagnosis Date Noted  . Nausea & vomiting 05/30/2018  . Nausea and vomiting 05/29/2018  . Partial seizure ([place]) 03/14/2018  . Stroke ([place]) 03/07/2018  . Slurred speech 11/14/2017  . Diabetes ([place]) 11/12/2017  . CAD (coronary artery disease) 11/12/2017  . COPD (chronic obstructive pulmonary disease) ([place]) 11/12/2017  . ESRD on dialysis ([place]) 11/12/2017  . Hematemesis 06/27/2017  . CVA (cerebral vascular accident) ([place]) 04/14/2016  . Essential hypertension, benign 11/27/2015  . Hyperlipidemia 11/27/2015  . Obesity 11/27/2015  . Prolonged Q-T interval on ECG 11/26/2015  . Intractable nausea and vomiting 08/08/2015   PCP:  Lowella Bandy, MD Pharmacy:   [facility] [address] [place] [address] Phone: [phone] Fax: [phone]     Social Determinants of Health (SDOH) Interventions    Readmission Risk Interventions No flowsheet data found.

## 2018-12-31 NOTE — Progress Notes (Signed)
Established hemodialysis patient known at [facility] MWF 6:00, no changes at this time to chair time.

## 2018-12-31 NOTE — ED Notes (Signed)
ED TO INPATIENT HANDOFF REPORT  ED Nurse Name and Phone #: Metta Clines RN  S Name/Age/Gender Cynthia Dean 68 y.o. female Room/Bed: ED11A/ED11A  Code Status   Code Status: Prior  Home/SNF/Other Home Patient oriented to: self, place, time and situation Is this baseline? Yes   Triage Complete: Triage complete  Chief Complaint abd pain/sob  Triage Note Pt c/o abd pain with diarrhea and SOB, sore throat since yesterday. Pt is disoriented to time, pt unable to provided HT WT or year.    Allergies Allergies  Allergen Reactions  . Tape Itching    Skin Dermatitis/itching (tape adhesive) Skin Dermatitis/itching (tape adhesive)  . Tapentadol Itching    Skin Dermatitis/itching (tape adhesive) Skin Dermatitis/itching (tape adhesive)     Level of Care/Admitting Diagnosis ED Disposition    ED Disposition Condition Comment   Admit  The patient appears reasonably stabilized for admission considering the current resources, flow, and capabilities available in the ED at this time, and I doubt any other [facility] requiring further screening and/or treatment in the ED prior to admission is  present.       B Medical/Surgery History Past Medical History:  Diagnosis Date  . COPD (chronic obstructive pulmonary disease) ([place])   . Coronary artery disease   . Diabetes mellitus without complication ([place])   . ESRD on dialysis ([place])   . Heart murmur   . Hyperlipidemia   . Hypertension   . Stroke [facility])    Past Surgical History:  Procedure Laterality Date  . A/V FISTULAGRAM Left 12/20/2016   Procedure: A/V Fistulagram;  Surgeon: Katha Cabal, MD;  Location: [place] CV LAB;  Service: Cardiovascular;  Laterality: Left;  . A/V SHUNT INTERVENTION N/A 12/20/2016   Procedure: A/V Shunt Intervention;  Surgeon: Katha Cabal, MD;  Location: [place] CV LAB;  Service: Cardiovascular;  Laterality: N/A;  . A/V SHUNTOGRAM Left 09/11/2017   Procedure: A/V SHUNTOGRAM;  Surgeon:  Algernon Huxley, MD;  Location: [place] CV LAB;  Service: Cardiovascular;  Laterality: Left;  . BREAST BIOPSY Bilateral 07/19/00   neg  . LOOP RECORDER INSERTION N/A 11/16/2017   Procedure: LOOP RECORDER INSERTION;  Surgeon: Deboraha Sprang, MD;  Location: [place] CV LAB;  Service: Cardiovascular;  Laterality: N/A;  . PERIPHERAL VASCULAR CATHETERIZATION Left 02/02/2015   Procedure: A/V Shuntogram/Fistulagram;  Surgeon: Algernon Huxley, MD;  Location: [place] CV LAB;  Service: Cardiovascular;  Laterality: Left;  . PERIPHERAL VASCULAR CATHETERIZATION Left 02/02/2015   Procedure: A/V Shunt Intervention;  Surgeon: Algernon Huxley, MD;  Location: [place] CV LAB;  Service: Cardiovascular;  Laterality: Left;  . PERIPHERAL VASCULAR CATHETERIZATION Left 03/09/2015   Procedure: A/V Shuntogram/Fistulagram;  Surgeon: Algernon Huxley, MD;  Location: [place] CV LAB;  Service: Cardiovascular;  Laterality: Left;  . PERIPHERAL VASCULAR CATHETERIZATION N/A 03/09/2015   Procedure: A/V Shunt Intervention;  Surgeon: Algernon Huxley, MD;  Location: [place] CV LAB;  Service: Cardiovascular;  Laterality: N/A;  . PERIPHERAL VASCULAR CATHETERIZATION Left 04/17/2015   Procedure: Upper Extremity Angiography;  Surgeon: Algernon Huxley, MD;  Location: [place] CV LAB;  Service: Cardiovascular;  Laterality: Left;  . PERIPHERAL VASCULAR CATHETERIZATION  04/17/2015   Procedure: Upper Extremity Intervention;  Surgeon: Algernon Huxley, MD;  Location: [place] CV LAB;  Service: Cardiovascular;;  . PERIPHERAL VASCULAR CATHETERIZATION N/A 08/10/2015   Procedure: A/V Shuntogram/Fistulagram;  Surgeon: Algernon Huxley, MD;  Location: [place] CV LAB;  Service: Cardiovascular;  Laterality: N/A;  .  PERIPHERAL VASCULAR CATHETERIZATION N/A 08/10/2015   Procedure: A/V Shunt Intervention;  Surgeon: Algernon Huxley, MD;  Location: [place] CV LAB;  Service: Cardiovascular;  Laterality: N/A;  . PERIPHERAL VASCULAR CATHETERIZATION  Left 04/11/2016   Procedure: A/V Shuntogram/Fistulagram;  Surgeon: Algernon Huxley, MD;  Location: [place] CV LAB;  Service: Cardiovascular;  Laterality: Left;  . TEE WITHOUT CARDIOVERSION N/A 11/16/2017   Procedure: TRANSESOPHAGEAL ECHOCARDIOGRAM (TEE);  Surgeon: Minna Merritts, MD;  Location: [facility] OR[facility];  Service: Cardiovascular;  Laterality: N/A;     A IV Location/Drains/Wounds Patient Lines/Drains/Airways Status   Active Line/Drains/Airways    Name:   Placement date:   Placement time:   Site:   Days:   Peripheral IV 12/31/18 Right Antecubital   12/31/18    0819    Antecubital   less than 1          Intake/Output Last 24 hours No intake or output data in the 24 hours ending 12/31/18 1151  Labs/Imaging Results for orders placed or performed during the hospital encounter of 12/31/18 (from the past 48 hour(s))  CBC     Status: Abnormal   Collection Time: 12/31/18  8:18 AM  Result Value Ref Range   WBC 9.0 4.0 - 10.5 K/uL   RBC 3.33 (L) 3.87 - 5.11 MIL/uL   Hemoglobin 9.2 (L) 12.0 - 15.0 g/dL   HCT 29.8 (L) 36.0 - 46.0 %   MCV 89.5 80.0 - 100.0 fL   MCH 27.6 26.0 - 34.0 pg   MCHC 30.9 30.0 - 36.0 g/dL   RDW 14.5 11.5 - 15.5 %   Platelets 163 150 - 400 K/uL   nRBC 0.0 0.0 - 0.2 %    Comment: Performed at [facility], [address] [place], [place] [address]  Troponin I - ONCE - STAT     Status: None   Collection Time: 12/31/18  8:18 AM  Result Value Ref Range   Troponin I <0.03 <0.03 ng/mL    Comment: Performed at [facility], [address] [place], [place] [address]  Comprehensive metabolic panel     Status: Abnormal   Collection Time: 12/31/18  8:18 AM  Result Value Ref Range   Sodium 139 135 - 145 mmol/L   Potassium 5.9 (H) 3.5 - 5.1 mmol/L   Chloride 104 98 - 111 mmol/L   CO2 23 22 - 32 mmol/L   Glucose, Bld 237 (H) 70 - 99 mg/dL   BUN 50 (H) 8 - 23 mg/dL   Creatinine, Ser 8.72 (H) 0.44 - 1.00 mg/dL   Calcium 8.2 (L) 8.9 - 10.3 mg/dL    Total Protein 7.4 6.5 - 8.1 g/dL   Albumin 3.7 3.5 - 5.0 g/dL   AST 14 (L) 15 - 41 U/L   ALT 14 0 - 44 U/L   Alkaline Phosphatase 137 (H) 38 - 126 U/L   Total Bilirubin 0.7 0.3 - 1.2 mg/dL   GFR calc non Af Amer 4 (L) >60 mL/min   GFR calc Af Amer 5 (L) >60 mL/min   Anion gap 12 5 - 15    Comment: Performed at [facility], [address] [place], [place] [address]  Lipase, blood     Status: None   Collection Time: 12/31/18  8:18 AM  Result Value Ref Range   Lipase 51 11 - 51 U/L    Comment: Performed at [facility], [address] [place],  [address]  Brain natriuretic peptide  Status: Abnormal   Collection Time: 12/31/18  8:18 AM  Result Value Ref Range   B Natriuretic Peptide 528.0 (H) 0.0 - 100.0 pg/mL    Comment: Performed at [facility], [address] [place], [place] [address]  Magnesium     Status: None   Collection Time: 12/31/18  8:18 AM  Result Value Ref Range   Magnesium 1.8 1.7 - 2.4 mg/dL    Comment: Performed at [facility], [address] [place], [place] [address]  SARS Coronavirus 2 (CEPHEID- Performed in [place] hospital lab), Hosp Order     Status: None   Collection Time: 12/31/18  8:42 AM  Result Value Ref Range   SARS Coronavirus 2 NEGATIVE NEGATIVE    Comment: (NOTE) If result is NEGATIVE SARS-CoV-2 target nucleic acids are NOT DETECTED. The SARS-CoV-2 RNA is generally detectable in upper and lower  respiratory specimens during the acute phase of infection. The lowest  concentration of SARS-CoV-2 viral copies this assay can detect is 250  copies / mL. A negative result does not preclude SARS-CoV-2 infection  and should not be used as the sole basis for treatment or other  patient management decisions.  A negative result may occur with  improper specimen collection / handling, submission of specimen other  than nasopharyngeal swab, presence of viral mutation(s) within the  areas targeted by  this assay, and inadequate number of viral copies  (<250 copies / mL). A negative result must be combined with clinical  observations, patient history, and epidemiological information. If result is POSITIVE SARS-CoV-2 target nucleic acids are DETECTED. The SARS-CoV-2 RNA is generally detectable in upper and lower  respiratory specimens dur ing the acute phase of infection.  Positive  results are indicative of active infection with SARS-CoV-2.  Clinical  correlation with patient history and other diagnostic information is  necessary to determine patient infection status.  Positive results do  not rule out bacterial infection or co-infection with other viruses. If result is PRESUMPTIVE POSTIVE SARS-CoV-2 nucleic acids MAY BE PRESENT.   A presumptive positive result was obtained on the submitted specimen  and confirmed on repeat testing.  While 2019 novel coronavirus  (SARS-CoV-2) nucleic acids may be present in the submitted sample  additional confirmatory testing may be necessary for epidemiological  and / or clinical management purposes  to differentiate between  SARS-CoV-2 and other Sarbecovirus currently known to infect humans.  If clinically indicated additional testing with an alternate test  methodology [phone]) is advised. The SARS-CoV-2 RNA is generally  detectable in upper and lower respiratory sp ecimens during the acute  phase of infection. The expected result is Negative. Fact Sheet for Patients:  StrictlyIdeas.no Fact Sheet for Healthcare Providers: BankingDealers.co.za This test is not yet approved or cleared by the [place] FDA and has been authorized for detection and/or diagnosis of SARS-CoV-2 by FDA under an Emergency Use Authorization (EUA).  This EUA will remain in effect (meaning this test can be used) for the duration of the COVID-19 declaration under Section 564(b)(1) of the Act, 21 U.S.C. section  360bbb-3(b)(1), unless the authorization is terminated or revoked sooner. Performed at [facility], [address] [place], [place] [address]   Lactic acid, plasma     Status: None   Collection Time: 12/31/18  8:42 AM  Result Value Ref Range   Lactic Acid, Venous 1.3 0.5 - 1.9 mmol/L    Comment: Performed at [facility], [address] [place],  [address]  Urinalysis, Complete w Microscopic     Status: Abnormal   Collection Time: 12/31/18  8:42 AM  Result Value Ref Range   Color, Urine STRAW (A) YELLOW   APPearance CLEAR (A) CLEAR   Specific Gravity, Urine 1.010 1.005 - 1.030   pH 8.0 5.0 - 8.0   Glucose, UA 150 (A) NEGATIVE mg/dL   Hgb urine dipstick LARGE (A) NEGATIVE   Bilirubin Urine NEGATIVE NEGATIVE   Ketones, ur NEGATIVE NEGATIVE mg/dL   Protein, ur 100 (A) NEGATIVE mg/dL   Nitrite NEGATIVE NEGATIVE   Leukocytes,Ua NEGATIVE NEGATIVE   RBC / HPF >50 (H) 0 - 5 RBC/hpf   WBC, UA NONE SEEN 0 - 5 WBC/hpf   Bacteria, UA RARE (A) NONE SEEN   Squamous Epithelial / LPF NONE SEEN 0 - 5   Mucus PRESENT    Hyaline Casts, UA PRESENT     Comment: Performed at [facility], [address], [place], [place] [address]   Dg Chest Portable 1 View  Result Date: 12/31/2018 CLINICAL DATA:  Abdominal pain with diarrhea, shortness of breath, sore throat since yesterday, history coronary artery disease, diabetes mellitus, hypertension, end-stage renal disease on dialysis, stroke EXAM: PORTABLE CHEST 1 VIEW COMPARISON:  Portable exam 0807 hours compared to 05/29/2018 FINDINGS: Loop recorder projects over cardiac silhouette. Enlargement of cardiac silhouette with pulmonary vascular congestion. Atherosclerotic calcification aorta. Interstitial infiltrates likely representing pulmonary edema. No gross pleural effusion or pneumothorax. No focal osseous lesions. IMPRESSION: Enlargement of cardiac silhouette with pulmonary vascular congestion and mild diffuse  pulmonary edema, question CHF versus fluid overload. Electronically Signed   By: Lavonia Dana M.D.   On: 12/31/2018 08:39    Pending Labs Unresulted Labs (From admission, onward)    Start     Ordered   12/31/18 1233  Lactic acid, plasma  Now then every 2 hours,   STAT     12/31/18 1143          Vitals/Pain Today's Vitals   12/31/18 0909 12/31/18 1100 12/31/18 1130 12/31/18 1143  BP: (!) 178/86 (!) 187/79 (!) 195/79 (!) 195/79  Pulse: 77 72 76 74  Resp: 18 (!) 23 18 20   SpO2: 97% 100% 96% 100%  Weight:      Height:      PainSc: 0-No pain   0-No pain    Isolation Precautions Droplet and Contact precautions  Medications Medications  sodium chloride flush (NS) 0.9 % injection 3 mL (3 mLs Intravenous Given 12/31/18 1146)  furosemide (LASIX) injection 40 mg (40 mg Intravenous Given 12/31/18 1145)    Mobility walks High fall risk   Focused Assessments A&Ox4; NSR; 3L [place] (2L [place] is home O2); SOB upon any exersion; hyperkalemia; elevated BNP; HTN; diarrhea/abd pain; admit for dialysis    R Recommendations: See Admitting Provider Note  Report given to:   Additional Notes:  R 20g ac; 40 of lasix given

## 2018-12-31 NOTE — Progress Notes (Signed)
Post HD Tx    12/31/18 2141  Hand-Off documentation  Report given to (Full Name) Erline Levine, RN   Report received from (Full Name) Trudee Grip, RN   Vital Signs  Temp 98.1 F (36.7 C)  Temp Source Oral  Pulse Rate 78  Pulse Rate Source Monitor  Resp (!) 24  BP 138/72  BP Location Right Arm  BP Method Automatic  Patient Position (if appropriate) Lying  Oxygen Therapy  SpO2 93 %  O2 Device Nasal Cannula  O2 Flow Rate (L/min) 3 L/min  Pulse Oximetry Type Continuous  Pain Assessment  Pain Scale 0-10  Pain Score 0  Dialysis Weight  Weight 131.3 kg  Type of Weight Post-Dialysis  Post-Hemodialysis Assessment  Rinseback Volume (mL) 250 mL  KECN 68.3 V  Dialyzer Clearance Lightly streaked  Duration of HD Treatment -hour(s) 3.5 hour(s)  Hemodialysis Intake (mL) 500 mL  UF Total -Machine (mL) 3000 mL  Net UF (mL) 2500 mL  Tolerated HD Treatment Yes  AVG/AVF Arterial Site Held (minutes) 5 minutes  AVG/AVF Venous Site Held (minutes) 5 minutes

## 2018-12-31 NOTE — Progress Notes (Signed)
Family Meeting Note  Advance Directive:no  Today a meeting took place with the Patient.  Patient is able to participate.  The following clinical team members were present during this meeting:MD  The following were discussed:Patient's diagnosis: pulmonary edema, Patient's progosis: Unable to determine and Goals for treatment: Full Code   Patient states that she would "want everything done to keep her heart beating and keep her lungs working".  Will make patient full code.  Additional follow-up to be provided: prn  Time spent during discussion:20 minutes  Evette Doffing, MD

## 2018-12-31 NOTE — Progress Notes (Signed)
HD TX completed, tolerated well, UF goal met pt stable to return to floor unit   12/31/18 2139  Vital Signs  Pulse Rate 74  Pulse Rate Source Monitor  BP 135/68  BP Location Right Arm  BP Method Automatic  Patient Position (if appropriate) Lying  Oxygen Therapy  SpO2 93 %  O2 Device Nasal Cannula  O2 Flow Rate (L/min) 3 L/min  During Hemodialysis Assessment  Blood Flow Rate (mL/min) 400 mL/min  Arterial Pressure (mmHg) -80 mmHg  Venous Pressure (mmHg) 180 mmHg  Transmembrane Pressure (mmHg) 70 mmHg  Ultrafiltration Rate (mL/min) 1000 mL/min  Dialysate Flow Rate (mL/min) 800 ml/min  Conductivity: Machine  13.6  HD Safety Checks Performed Yes  KECN 68.3 KECN  Dialysis Fluid Bolus Normal Saline  Bolus Amount (mL) 250 mL  Intra-Hemodialysis Comments Tx completed;Tolerated well

## 2018-12-31 NOTE — Progress Notes (Signed)
Pt wheezing with increased work of breathing. Dr.Mayo notified and orders received.

## 2018-12-31 NOTE — ED Provider Notes (Signed)
[facility] Emergency Department Provider Note ____________________________________________   First MD Initiated Contact with Patient 12/31/18 0809     (approximate)  I have reviewed the triage vital signs and the nursing notes.   HISTORY  Chief Complaint Abdominal Pain and Shortness of Breath    HPI Cynthia Dean is a 68 y.o. female with PMH as noted below including ESRD on dialysis who presents with shortness of breath, gradual onset over the last 2 days, and not associated with cough or fever.  The shortness of breath has been associated with some upper abdominal pain and multiple episodes of diarrhea.  The patient reports a small amount of blood in the stool.  The patient reports that she is not on oxygen normally but does have it and uses it occasionally.  She states her most recent dialysis was 3 days ago.  Past Medical History:  Diagnosis Date  . COPD (chronic obstructive pulmonary disease) ([place])   . Coronary artery disease   . Diabetes mellitus without complication ([place])   . ESRD on dialysis ([place])   . Heart murmur   . Hyperlipidemia   . Hypertension   . Stroke [facility])     Patient Active Problem List   Diagnosis Date Noted  . Nausea & vomiting 05/30/2018  . Nausea and vomiting 05/29/2018  . Partial seizure ([place]) 03/14/2018  . Stroke ([place]) 03/07/2018  . Slurred speech 11/14/2017  . Diabetes ([place]) 11/12/2017  . CAD (coronary artery disease) 11/12/2017  . COPD (chronic obstructive pulmonary disease) ([place]) 11/12/2017  . ESRD on dialysis ([place]) 11/12/2017  . Hematemesis 06/27/2017  . CVA (cerebral vascular accident) ([place]) 04/14/2016  . Essential hypertension, benign 11/27/2015  . Hyperlipidemia 11/27/2015  . Obesity 11/27/2015  . Prolonged Q-T interval on ECG 11/26/2015  . Intractable nausea and vomiting 08/08/2015    Past Surgical History:  Procedure Laterality Date  . A/V FISTULAGRAM Left 12/20/2016   Procedure: A/V Fistulagram;   Surgeon: Katha Cabal, MD;  Location: [place] CV LAB;  Service: Cardiovascular;  Laterality: Left;  . A/V SHUNT INTERVENTION N/A 12/20/2016   Procedure: A/V Shunt Intervention;  Surgeon: Katha Cabal, MD;  Location: [place] CV LAB;  Service: Cardiovascular;  Laterality: N/A;  . A/V SHUNTOGRAM Left 09/11/2017   Procedure: A/V SHUNTOGRAM;  Surgeon: Algernon Huxley, MD;  Location: [place] CV LAB;  Service: Cardiovascular;  Laterality: Left;  . BREAST BIOPSY Bilateral 07/19/00   neg  . LOOP RECORDER INSERTION N/A 11/16/2017   Procedure: LOOP RECORDER INSERTION;  Surgeon: Deboraha Sprang, MD;  Location: [place] CV LAB;  Service: Cardiovascular;  Laterality: N/A;  . PERIPHERAL VASCULAR CATHETERIZATION Left 02/02/2015   Procedure: A/V Shuntogram/Fistulagram;  Surgeon: Algernon Huxley, MD;  Location: [place] CV LAB;  Service: Cardiovascular;  Laterality: Left;  . PERIPHERAL VASCULAR CATHETERIZATION Left 02/02/2015   Procedure: A/V Shunt Intervention;  Surgeon: Algernon Huxley, MD;  Location: [place] CV LAB;  Service: Cardiovascular;  Laterality: Left;  . PERIPHERAL VASCULAR CATHETERIZATION Left 03/09/2015   Procedure: A/V Shuntogram/Fistulagram;  Surgeon: Algernon Huxley, MD;  Location: [place] CV LAB;  Service: Cardiovascular;  Laterality: Left;  . PERIPHERAL VASCULAR CATHETERIZATION N/A 03/09/2015   Procedure: A/V Shunt Intervention;  Surgeon: Algernon Huxley, MD;  Location: [place] CV LAB;  Service: Cardiovascular;  Laterality: N/A;  . PERIPHERAL VASCULAR CATHETERIZATION Left 04/17/2015   Procedure: Upper Extremity Angiography;  Surgeon: Algernon Huxley, MD;  Location: [facility] INVASIVE CV  LAB;  Service: Cardiovascular;  Laterality: Left;  . PERIPHERAL VASCULAR CATHETERIZATION  04/17/2015   Procedure: Upper Extremity Intervention;  Surgeon: Algernon Huxley, MD;  Location: [place] CV LAB;  Service: Cardiovascular;;  . PERIPHERAL VASCULAR CATHETERIZATION N/A 08/10/2015    Procedure: A/V Shuntogram/Fistulagram;  Surgeon: Algernon Huxley, MD;  Location: [place] CV LAB;  Service: Cardiovascular;  Laterality: N/A;  . PERIPHERAL VASCULAR CATHETERIZATION N/A 08/10/2015   Procedure: A/V Shunt Intervention;  Surgeon: Algernon Huxley, MD;  Location: [place] CV LAB;  Service: Cardiovascular;  Laterality: N/A;  . PERIPHERAL VASCULAR CATHETERIZATION Left 04/11/2016   Procedure: A/V Shuntogram/Fistulagram;  Surgeon: Algernon Huxley, MD;  Location: [place] CV LAB;  Service: Cardiovascular;  Laterality: Left;  . TEE WITHOUT CARDIOVERSION N/A 11/16/2017   Procedure: TRANSESOPHAGEAL ECHOCARDIOGRAM (TEE);  Surgeon: Minna Merritts, MD;  Location: [facility] ORS;  Service: Cardiovascular;  Laterality: N/A;    Prior to Admission medications   Medication Sig Start Date End Date Taking? Authorizing Provider  acetaminophen (TYLENOL) 325 MG tablet Take 2 tablets (650 mg total) by mouth every 4 (four) hours as needed for mild pain (or temp > 37.5 C (99.5 F)). 11/17/17   Nicholes Mango, MD  albuterol-ipratropium (COMBIVENT) 18-103 MCG/ACT inhaler Inhale 2 puffs into the lungs 2 (two) times daily.    [provider]  amLODipine (NORVASC) 5 MG tablet Take 5 mg by mouth 2 (two) times daily. 08/07/18   [provider]  aspirin EC 325 MG tablet Take 325 mg by mouth daily. 05/29/18   [provider]  atorvastatin (LIPITOR) 80 MG tablet Take 80 mg by mouth every evening.     [provider]  budesonide-formoterol (SYMBICORT) 160-4.5 MCG/ACT inhaler Inhale 2 puffs into the lungs 2 (two) times daily. Patient not taking: Reported on 05/29/2018 07/01/17   Loletha Grayer, MD  butalbital-acetaminophen-caffeine [place]) [phone] MG tablet Take 1 tablet by mouth every 4 (four) hours as needed for headache. 08/12/18 08/12/19  Orbie Pyo, MD  calcium acetate (PHOSLO) 667 MG capsule Take 2,001 mg by mouth 3 (three) times daily with meals.     [provider]  cinacalcet (SENSIPAR) 60 MG tablet Take 60 mg by mouth daily.    [provider]  clopidogrel (PLAVIX) 75 MG tablet TAKE ONE TABLET BY MOUTH ONCE DAILY Patient taking differently: Take 75 mg by mouth daily.  06/14/16   Stegmayer, Joelene Millin A, PA-C  fluticasone (FLONASE) 50 MCG/ACT nasal spray Place 2 sprays into both nostrils daily. 08/07/18 08/07/19  [provider]  gabapentin (NEURONTIN) 100 MG capsule Take 100 mg by mouth 3 (three) times daily.    [provider]  insulin NPH-regular Human (NOVOLIN 70/30) (70-30) 100 UNIT/ML injection Inject 25 Units into the skin 2 (two) times daily with a meal. Except dialysis days. Do not take on Tuesdays, Thursdays, and Saturdays. 11/17/17   Gouru, Illene Silver, MD  ipratropium-albuterol (DUONEB) 0.5-2.5 (3) MG/3ML SOLN Take 3 mLs by nebulization every 6 (six) hours as needed (for shortness of breath/ wheezing).     [provider]  levETIRAcetam (KEPPRA) 1000 MG tablet Take 1 tablet (1,000 mg total) by mouth daily. 03/15/18   Vaughan Basta, MD  levETIRAcetam (KEPPRA) 250 MG tablet Take 1 tablet (250 mg total) by mouth 3 (three) times a week. Take after dialysis on Monday, wed and Friday in addition to regular dose. 03/14/18   Vaughan Basta, MD  lidocaine-prilocaine (EMLA) cream Apply 1 application topically as needed (  for port access).     [provider]  losartan (COZAAR) 100 MG tablet Take 100 mg by mouth daily. 09/12/17   [provider]  metoprolol succinate (TOPROL-XL) 100 MG 24 hr tablet Take 100 mg by mouth every evening. Take with or immediately following a meal.     [provider]  omeprazole (PRILOSEC) 20 MG capsule Take 20 mg by mouth 2 (two) times daily.     [provider]  polyethylene glycol (MIRALAX / GLYCOLAX) packet Take 17 g by mouth daily as needed for moderate constipation. 11/17/17   Nicholes Mango, MD  torsemide (DEMADEX) 100 MG tablet Take 100 mg  by mouth daily. 10/10/17   [provider]    Allergies Tape and Tapentadol  Family History  Problem Relation Age of Onset  . Breast cancer Father 52  . Stroke Mother   . Heart attack Mother   . Heart Problems Sister   . Breast cancer Cousin 6       1 st cousin. Maternal     Social History Social History   Tobacco Use  . Smoking status: Former Smoker    Packs/day: 0.00    Types: Cigarettes  . Smokeless tobacco: Never Used  Substance Use Topics  . Alcohol use: No  . Drug use: No    Review of Systems  Constitutional: No fever. Eyes: No redness. ENT: Positive for sore throat. Cardiovascular: Denies chest pain. Respiratory: Positive for shortness of breath. Gastrointestinal: No vomiting.  Positive for diarrhea.  Genitourinary: Negative for dysuria.  Musculoskeletal: Negative for back pain. Skin: Negative for rash. Neurological: Negative for headache.   ____________________________________________   PHYSICAL EXAM:  VITAL SIGNS: ED Triage Vitals  Enc Vitals Group     BP --      Pulse --      Resp --      Temp --      Temp src --      SpO2 --      Weight 12/31/18 0800 295 lb (133.8 kg)     Height 12/31/18 0800 5\' 9"  (1.753 m)     Head Circumference --      Peak Flow --      Pain Score 12/31/18 0759 8     Pain Loc --      Pain Edu? --      Excl. in [place]? --     Constitutional: Alert and oriented.  Slightly weak appearing but in no acute distress. Eyes: Conjunctivae are normal.  EOMI.  PERRLA. Head: Atraumatic. Nose: No congestion/rhinnorhea. Mouth/Throat: Mucous membranes are somewhat dry.   Neck: Normal range of motion.  Cardiovascular: Normal rate, regular rhythm. Grossly normal heart sounds.  Good peripheral circulation. Respiratory: Increased respiratory effort.  No retractions.  Diminished breath sounds bilaterally with a few faint wheezes. Gastrointestinal: Soft and nontender. No distention.  Genitourinary: No flank tenderness.  Musculoskeletal: No lower extremity edema.  Extremities warm and well perfused.  Neurologic: Mildly slurred speech, baseline after prior stroke. No gross focal neurologic deficits are appreciated.  Skin:  Skin is warm and dry. No rash noted. Psychiatric: Calm and cooperative.  ____________________________________________   LABS (all labs ordered are listed, but only abnormal results are displayed)  Labs Reviewed  CBC - Abnormal; Notable for the following components:      Result Value   RBC 3.33 (*)    Hemoglobin 9.2 (*)    HCT 29.8 (*)    All other components within normal limits  COMPREHENSIVE METABOLIC PANEL - Abnormal; Notable for the following components:   Potassium 5.9 (*)    Glucose, Bld 237 (*)    BUN 50 (*)    Creatinine, Ser 8.72 (*)    Calcium 8.2 (*)    AST 14 (*)    Alkaline Phosphatase 137 (*)    GFR calc non Af Amer 4 (*)    GFR calc Af Amer 5 (*)    All other components within normal limits  BRAIN NATRIURETIC PEPTIDE - Abnormal; Notable for the following components:   B Natriuretic Peptide 528.0 (*)    All other components within normal limits  URINALYSIS, COMPLETE (UACMP) WITH MICROSCOPIC - Abnormal; Notable for the following components:   Color, Urine STRAW (*)    APPearance CLEAR (*)    Glucose, UA 150 (*)    Hgb urine dipstick LARGE (*)    Protein, ur 100 (*)    RBC / HPF >50 (*)    Bacteria, UA RARE (*)    All other components within normal limits  SARS CORONAVIRUS 2 (HOSPITAL ORDER, [place] [facility])  TROPONIN I  LIPASE, BLOOD  LACTIC ACID, PLASMA  MAGNESIUM  LACTIC ACID, PLASMA   ____________________________________________  EKG  ED ECG REPORT I, Arta Silence, the attending physician, personally viewed and interpreted this ECG.  Date: 12/31/2018 EKG Time: 0807 Rate: 80 Rhythm: normal sinus rhythm QRS Axis: normal Intervals: normal ST/T Wave abnormalities: normal Narrative Interpretation: no evidence of  acute ischemia  ____________________________________________  RADIOLOGY  CXR: Diffuse pulmonary edema  ____________________________________________   PROCEDURES  Procedure(s) performed: No  Procedures  Critical Care performed: No ____________________________________________   INITIAL IMPRESSION / ASSESSMENT AND PLAN / ED COURSE  Pertinent labs & imaging results that were available during my care of the patient were reviewed by me and considered in my medical decision making (see chart for details).  68 year old female with a history of ESRD on dialysis, COPD, CVA, and other PMH as noted above presents with shortness of breath over the last several days associated with crampy upper abdominal pain and diarrhea.  I reviewed the past medical records in [place].  The patient was most recent admitted last fall with headache, nausea, and vomiting.  She was most recently seen in the ED 3 months ago with diffuse weakness and negative work-up.  On exam today the patient is somewhat weak and uncomfortable appearing with increased work of breathing but no acute respiratory distress.  O2 saturation was in the low 80s on room air although the patient is in the high 90s on 3 L by nasal cannula.  There are diminished breath sounds bilaterally with a few faint wheezes.  Abdomen is soft with no focal tenderness.  The remainder of the exam is as described above.  Differential includes COPD exacerbation, pneumonia, viral bronchitis, fluid overload related to ESRD, or less likely other cardiac etiology.  The abdominal pain and diarrhea are most consistent with viral etiology.  We will obtain lab work-up, chest x-ray, COVID-19 swab, and reassess.  Patient will likely need admission.  ----------------------------------------- 11:25 AM on 12/31/2018 -----------------------------------------  X-ray and lab work-up are consistent with pulmonary edema.  I ordered IV Lasix.  Potassium is elevated however  there are no EKG findings of hyperkalemia so no indication for emergent treatment.  The patient will need dialysis.  I signed the patient out to the hospitalist.  _______________________  [name] was evaluated in Emergency Department on 12/31/2018 for the symptoms  described in the history of present illness. She was evaluated in the context of the global COVID-19 pandemic, which necessitated consideration that the patient might be at risk for infection with the SARS-CoV-2 virus that causes COVID-19. Institutional protocols and algorithms that pertain to the evaluation of patients at risk for COVID-19 are in a state of rapid change based on information released by regulatory bodies including the CDC and federal and state organizations. These policies and algorithms were followed during the patient's care in the ED.  ____________________________________________   FINAL CLINICAL IMPRESSION(S) / ED DIAGNOSES  Final diagnoses:  Acute respiratory failure with hypoxia (HCC)  Hypervolemia, unspecified hypervolemia type      NEW MEDICATIONS STARTED DURING THIS VISIT:  New Prescriptions   No medications on file     Note:  This document was prepared using Dragon voice recognition software and may include unintentional dictation errors.    Arta Silence, MD 12/31/18 1125

## 2019-01-01 ENCOUNTER — Inpatient Hospital Stay: Payer: Medicare Other

## 2019-01-01 ENCOUNTER — Observation Stay
Admit: 2019-01-01 | Discharge: 2019-01-01 | Disposition: A | Discharge status: Home / Self Care | Payer: Medicare Other | Attending: Nurse Practitioner | Admitting: Nurse Practitioner

## 2019-01-01 DIAGNOSIS — Z992 Dependence on renal dialysis: Secondary | ICD-10-CM | POA: Diagnosis not present

## 2019-01-01 DIAGNOSIS — I132 Hypertensive heart and chronic kidney disease with heart failure and with stage 5 chronic kidney disease, or end stage renal disease: Secondary | ICD-10-CM | POA: Diagnosis not present

## 2019-01-01 DIAGNOSIS — Z794 Long term (current) use of insulin: Secondary | ICD-10-CM | POA: Diagnosis not present

## 2019-01-01 DIAGNOSIS — Z87891 Personal history of nicotine dependence: Secondary | ICD-10-CM | POA: Diagnosis not present

## 2019-01-01 DIAGNOSIS — N186 End stage renal disease: Secondary | ICD-10-CM | POA: Diagnosis not present

## 2019-01-01 DIAGNOSIS — E1122 Type 2 diabetes mellitus with diabetic chronic kidney disease: Secondary | ICD-10-CM | POA: Diagnosis not present

## 2019-01-01 DIAGNOSIS — Z8249 Family history of ischemic heart disease and other diseases of the circulatory system: Secondary | ICD-10-CM | POA: Diagnosis not present

## 2019-01-01 DIAGNOSIS — Z888 Allergy status to other drugs, medicaments and biological substances status: Secondary | ICD-10-CM | POA: Diagnosis not present

## 2019-01-01 DIAGNOSIS — K921 Melena: Secondary | ICD-10-CM | POA: Diagnosis not present

## 2019-01-01 DIAGNOSIS — Z91048 Other nonmedicinal substance allergy status: Secondary | ICD-10-CM | POA: Diagnosis not present

## 2019-01-01 DIAGNOSIS — I5043 Acute on chronic combined systolic (congestive) and diastolic (congestive) heart failure: Secondary | ICD-10-CM

## 2019-01-01 DIAGNOSIS — G4089 Other seizures: Secondary | ICD-10-CM | POA: Diagnosis not present

## 2019-01-01 DIAGNOSIS — E875 Hyperkalemia: Secondary | ICD-10-CM | POA: Diagnosis not present

## 2019-01-01 DIAGNOSIS — Z7982 Long term (current) use of aspirin: Secondary | ICD-10-CM | POA: Diagnosis not present

## 2019-01-01 DIAGNOSIS — J9601 Acute respiratory failure with hypoxia: Secondary | ICD-10-CM | POA: Diagnosis not present

## 2019-01-01 DIAGNOSIS — Z79899 Other long term (current) drug therapy: Secondary | ICD-10-CM | POA: Diagnosis not present

## 2019-01-01 DIAGNOSIS — Z7902 Long term (current) use of antithrombotics/antiplatelets: Secondary | ICD-10-CM | POA: Diagnosis not present

## 2019-01-01 DIAGNOSIS — Z20828 Contact with and (suspected) exposure to other viral communicable diseases: Secondary | ICD-10-CM | POA: Diagnosis not present

## 2019-01-01 DIAGNOSIS — Z7951 Long term (current) use of inhaled steroids: Secondary | ICD-10-CM | POA: Diagnosis not present

## 2019-01-01 DIAGNOSIS — I5033 Acute on chronic diastolic (congestive) heart failure: Secondary | ICD-10-CM | POA: Diagnosis not present

## 2019-01-01 DIAGNOSIS — R0602 Shortness of breath: Secondary | ICD-10-CM | POA: Diagnosis present

## 2019-01-01 DIAGNOSIS — E785 Hyperlipidemia, unspecified: Secondary | ICD-10-CM | POA: Diagnosis not present

## 2019-01-01 DIAGNOSIS — I69320 Aphasia following cerebral infarction: Secondary | ICD-10-CM | POA: Diagnosis not present

## 2019-01-01 DIAGNOSIS — I2510 Atherosclerotic heart disease of native coronary artery without angina pectoris: Secondary | ICD-10-CM | POA: Diagnosis not present

## 2019-01-01 DIAGNOSIS — E1151 Type 2 diabetes mellitus with diabetic peripheral angiopathy without gangrene: Secondary | ICD-10-CM | POA: Diagnosis not present

## 2019-01-01 DIAGNOSIS — J441 Chronic obstructive pulmonary disease with (acute) exacerbation: Secondary | ICD-10-CM | POA: Diagnosis not present

## 2019-01-01 LAB — PHOSPHORUS: Phosphorus: 3.2 mg/dL (ref 2.5–4.6)

## 2019-01-01 LAB — ECHOCARDIOGRAM COMPLETE
Height: 69 in
Weight: 4631.42 oz

## 2019-01-01 LAB — HEPATITIS B SURFACE ANTIBODY, QUANTITATIVE: Hep B S AB Quant (Post): 10.5 m[IU]/mL (ref >9.9)

## 2019-01-01 LAB — BASIC METABOLIC PANEL
Anion gap: 13 (ref 5–15)
BUN: 44 mg/dL — ABNORMAL HIGH (ref 8–23)
CO2: 25 mmol/L (ref 22–32)
Calcium: 8.4 mg/dL — ABNORMAL LOW (ref 8.9–10.3)
Chloride: 99 mmol/L (ref 98–111)
Creatinine, Ser: 7.34 mg/dL — ABNORMAL HIGH (ref 0.44–1.00)
GFR calc Af Amer: 6 mL/min — ABNORMAL LOW (ref >60)
GFR calc non Af Amer: 5 mL/min — ABNORMAL LOW (ref >60)
Glucose, Bld: 363 mg/dL — ABNORMAL HIGH (ref 70–99)
Potassium: 5.8 mmol/L — ABNORMAL HIGH (ref 3.5–5.1)
Sodium: 137 mmol/L (ref 135–145)

## 2019-01-01 LAB — GLUCOSE, CAPILLARY
Glucose-Capillary: 342 mg/dL — ABNORMAL HIGH (ref 70–99)
Glucose-Capillary: 161 mg/dL — ABNORMAL HIGH (ref 70–99)
Glucose-Capillary: 218 mg/dL — ABNORMAL HIGH (ref 70–99)

## 2019-01-01 LAB — HEPATITIS B SURFACE ANTIGEN: Hepatitis B Surface Ag: NEGATIVE

## 2019-01-01 LAB — CBC
HCT: 30.2 % — ABNORMAL LOW (ref 36.0–46.0)
Hemoglobin: 9.4 g/dL — ABNORMAL LOW (ref 12.0–15.0)
MCH: 27.6 pg (ref 26.0–34.0)
MCHC: 31.1 g/dL (ref 30.0–36.0)
MCV: 88.8 fL (ref 80.0–100.0)
Platelets: 166 10*3/uL (ref 150–400)
RBC: 3.4 MIL/uL — ABNORMAL LOW (ref 3.87–5.11)
RDW: 14.4 % (ref 11.5–15.5)
WBC: 11.5 10*3/uL — ABNORMAL HIGH (ref 4.0–10.5)
nRBC: 0 % (ref 0.0–0.2)

## 2019-01-01 LAB — PROCALCITONIN: Procalcitonin: 0.57 ng/mL

## 2019-01-01 LAB — MRSA PCR SCREENING: MRSA by PCR: POSITIVE — AB

## 2019-01-01 MED ORDER — MUPIROCIN 2 % EX OINT
1.0000 "application " | TOPICAL_OINTMENT | Freq: Two times a day (BID) | CUTANEOUS | Status: DC
  Administered 2019-01-01 – 2019-01-02 (×3): 1 via NASAL
  Filled 2019-01-01: qty 22

## 2019-01-01 MED ORDER — INSULIN GLARGINE 100 UNIT/ML ~~LOC~~ SOLN
13.0000 [IU] | Freq: Every day | SUBCUTANEOUS | Status: DC
  Administered 2019-01-01: 20:00:00 13 [IU] via SUBCUTANEOUS
  Filled 2019-01-01 (×2): qty 0.13

## 2019-01-01 MED ORDER — METHYLPREDNISOLONE SODIUM SUCC 125 MG IJ SOLR
60.0000 mg | Freq: Two times a day (BID) | INTRAMUSCULAR | Status: DC
  Administered 2019-01-01: 60 mg via INTRAVENOUS
  Filled 2019-01-01 (×3): qty 2

## 2019-01-01 MED ORDER — PATIROMER SORBITEX CALCIUM 8.4 G PO PACK
8.4000 g | PACK | Freq: Every day | ORAL | Status: DC
  Administered 2019-01-01: 8.4 g via ORAL
  Filled 2019-01-01 (×2): qty 1

## 2019-01-01 MED ORDER — IPRATROPIUM-ALBUTEROL 0.5-2.5 (3) MG/3ML IN SOLN
3.0000 mL | RESPIRATORY_TRACT | Status: DC | PRN
  Filled 2019-01-01: qty 3

## 2019-01-01 MED ORDER — INSULIN ASPART 100 UNIT/ML ~~LOC~~ SOLN
2.0000 [IU] | Freq: Three times a day (TID) | SUBCUTANEOUS | Status: DC
  Administered 2019-01-01: 2 [IU] via SUBCUTANEOUS
  Filled 2019-01-01: qty 0.02
  Filled 2019-01-01: qty 1

## 2019-01-01 MED ORDER — PERFLUTREN LIPID MICROSPHERE
1.0000 mL | INTRAVENOUS | Status: AC | PRN
  Administered 2019-01-01: 09:00:00 2 mL via INTRAVENOUS
  Filled 2019-01-01: qty 10

## 2019-01-01 NOTE — Progress Notes (Signed)
[facility] [place] NAME: Cynthia Dean    MR#:  [id number]  DATE OF BIRTH:  05-24-1951  SUBJECTIVE:   Chief Complaint  Patient presents with  . Abdominal Pain  . Shortness of Breath   Patient is seen at the bedside. Patient is found laying in bed in NAD. Overall she feels her condition is gradually improving. Voices no new complaints. No new events reported overnight.  REVIEW OF SYSTEMS:  ROS Constitutional: Negative for chills, fever, malaise/fatigue and weight loss.  HENT: Negative for congestion, hearing loss and sore throat.   Eyes: Negative for blurred vision and double vision.  Respiratory: Positive for shortness of breath and wheezing. Negative for cough, hemoptysis and sputum production.   Cardiovascular: Positive for orthopnea. Negative for chest pain, palpitations and leg swelling.  Gastrointestinal: Positive for abdominal pain, blood in stool and diarrhea. Negative for nausea and vomiting.  Genitourinary: Negative for dysuria and urgency.  Musculoskeletal: Negative for myalgias.  Skin: Negative for rash.  Neurological: Positive for seizures. Negative for dizziness, sensory change, speech change, focal weakness and headaches.  Psychiatric/Behavioral: Negative for depression.  DRUG ALLERGIES:   Allergies  Allergen Reactions  . Tape Itching    Skin Dermatitis/itching (tape adhesive) Skin Dermatitis/itching (tape adhesive)  . Tapentadol Itching    Skin Dermatitis/itching (tape adhesive) Skin Dermatitis/itching (tape adhesive)    VITALS:  Blood pressure (!) 169/72, pulse 64, temperature 98.2 F (36.8 C), temperature source Oral, resp. rate 14, height 5\' 9"  (1.753 m), weight 132.4 kg, SpO2 99 %. PHYSICAL EXAMINATION:   GENERAL:  68 y.o.-year-old patient lying in the bed with no acute distress.  EYES: Pupils equal, round, reactive to light and accommodation. No scleral icterus. Extraocular muscles intact.  HEENT: Head  atraumatic, normocephalic. Oropharynx and nasopharynx clear.  NECK:  Supple, no jugular venous distention. No thyroid enlargement, no tenderness.  LUNGS: Decreased breath sounds bilaterally, mild wheezing with rales and rhonchi. No crepitation. No use of accessory muscles of respiration.  CARDIOVASCULAR: S1, S2 normal. murmurs, No rubs, or gallops.  ABDOMEN: Soft, nontender, nondistended. Bowel sounds present. No organomegaly or mass.  EXTREMITIES: No pedal edema, cyanosis, or clubbing. No rash or lesions. + pedal pulses MUSCULOSKELETAL: Normal bulk, and power was 5+ grip and elbow, knee, and ankle flexion and extension bilaterally.  NEUROLOGIC:Alert and oriented x 3. CN 2-12 intact.Milddysarthria, mild expressive aphasia Sensation to light touch and cold stimuli intact bilaterally. Finger to nose nl. Babinski is downgoing. DTR's (biceps, patellar, and achilles) 2+ and symmetric throughout. Gait not tested due to safety concern. PSYCHIATRIC: The patient is alert and oriented x 3.  SKIN: No obvious rash, lesion, or ulcer.     DATA REVIEWED:  LABORATORY PANEL:  Female CBC Recent Labs  Lab 01/01/19 0524  WBC 11.5*  HGB 9.4*  HCT 30.2*  PLT 166   ------------------------------------------------------------------------------------------------------------------ Chemistries  Recent Labs  Lab 12/31/18 0818  01/01/19 0524  NA 139   < > 137  K 5.9*   < > 5.8*  CL 104   < > 99  CO2 23   < > 25  GLUCOSE 237*   < > 363*  BUN 50*   < > 44*  CREATININE 8.72*   < > 7.34*  CALCIUM 8.2*   < > 8.4*  MG 1.8  --   --   AST 14*  --   --   ALT 14  --   --   ALKPHOS 137*  --   --  BILITOT 0.7  --   --    < > = values in this interval not displayed.   RADIOLOGY:  Mr Brain Wo Contrast  Result Date: 01/01/2019 CLINICAL DATA:  History of CVA.  Slurred speech. EXAM: MRI HEAD WITHOUT CONTRAST TECHNIQUE: Multiplanar, multiecho pulse sequences of the brain and surrounding structures were obtained  without intravenous contrast. COMPARISON:  CT head 10/03/2018. MR head 05/29/2018. FINDINGS: Brain: No evidence for acute infarction, hemorrhage, mass lesion, or extra-axial fluid. Chronic RIGHT frontal operculum infarct. Chronic LEFT frontal subcortical infarcts. Chronic LEFT posterior temporal occipital infarct. Generalized atrophy, premature for age. Hydrocephalus ex vacuo. Chronic BILATERAL cerebellar infarcts. Chronic LEFT basal ganglia infarct. Extensive chronic microvascular ischemic change. Vascular: Flow voids are maintained in the carotid, basilar, and both vertebral arteries. Skull and upper cervical spine: Normal marrow signal. Sinuses/Orbits: Negative. Other: None. IMPRESSION: Atrophy and small vessel disease. Evidence of multifocal chronic ischemia. No acute intracranial findings. Electronically Signed   By: Staci Righter M.D.   On: 01/01/2019 12:32   Dg Chest Portable 1 View  Result Date: 12/31/2018 CLINICAL DATA:  Abdominal pain with diarrhea, shortness of breath, sore throat since yesterday, history coronary artery disease, diabetes mellitus, hypertension, end-stage renal disease on dialysis, stroke EXAM: PORTABLE CHEST 1 VIEW COMPARISON:  Portable exam 0807 hours compared to 05/29/2018 FINDINGS: Loop recorder projects over cardiac silhouette. Enlargement of cardiac silhouette with pulmonary vascular congestion. Atherosclerotic calcification aorta. Interstitial infiltrates likely representing pulmonary edema. No gross pleural effusion or pneumothorax. No focal osseous lesions. IMPRESSION: Enlargement of cardiac silhouette with pulmonary vascular congestion and mild diffuse pulmonary edema, question CHF versus fluid overload. Electronically Signed   By: Lavonia Dana M.D.   On: 12/31/2018 08:39   ASSESSMENT AND PLAN:   68 y.o. female 68 y.o. female with pertinent past medical history of CVA with residual effect, partial seizure, CAD, hypertension, ESRD on dialysis- T-TH-S, COPD, type 2 diabetes  mellitus, with long-term current use of insulin, diastolic heart failure and hyperlipidemia presenting to the ED with chief complaints of worsening shortness of breath, gradual onset over the last 2 days.  1.  Acute respiratory failure with hypoxia -likely secondary to fluid overload in a patient with history of ESRD missed dialysis today, COPD and diastolic heart failure. - Chest x-ray reviewed and shows pulmonary edema received IV Lasix in ED - COVID-19 negative - Received a dose of solumedrol yesterday  2.  Acute on chronic kidney disease - ESRD on dialysis- T-TH-S. s/p HD 12/31/18 - Scheduled for HD again today per Nephrology due to Persistent K+  5.8 today, creatine 7.34, calcium 8.4  - Nephrology following  3. Acute on chronic diastolic congestive Heart Failure: Acute presentation likely due to volume overload with associated symptoms of SOB. BNP elevated at 528 - Chest x-ray shows pulmonary edema as above Last Echo today withLV EF 55-60% - Beta-Blockade: Metoprolol - Diuretics: Torsemide. Diureses >1L negative per day until approach euvolemia / worsening renal function. - Low salt diet - Check daily weight - Strict I&Os  4. Hyperkalemia - due to ESRD. K 5.8. Plan for HD again today - Recheck in am  5. Chronic COPD- No evidence of exacerbation uses O2 as needed at home? - Supplemental O2, goal sat 88-92% - DuoNebs every 6 hours  6. Hx of CVA with residual aphasia - Continue dual therapy Aspirin 81 mg/day and Plavix 75 mg /day  - Repeat MRI brain today shows no acute intracranial abnormality  7.Coronary artery disease  - ASA  81mg  PO daily - Clopidogrel 75mg  PO daily  - HTN, HLD, DM control as below - Follows with cardiology Dr. Rockey Situ on outpatient basis   8.HLD + Goal LDL<70 - Atorvastatin 80mg  PO qhs  9.HTN- stable + Goal BP <130/80 - Beta-blocker: Metoprolol - Continue amlodipine - ACE-Inhibitor: Losartan  10.DM -Insulindependent at home -  SSI, standing and Lantus - Diabetes educator following  DVT prophylaxis on heparin subcu.  All the records are reviewed and case discussed with Care Management/Social Worker. Management plans discussed with the patient, family and they are in agreement.  CODE STATUS: Full Code  TOTAL TIME TAKING CARE OF THIS PATIENT: 38 minutes.   More than 50% of the time was spent in counseling/coordination of care: YES  POSSIBLE D/C IN 1 DAYS, DEPENDING ON CLINICAL CONDITION.   on 01/01/2019 at 3:22 PM  This patient was staffed with Dr. Valetta Fuller, [facility] who personally evaluated patient, reviewed documentation and agreed with assessment and plan of care as above.  Rufina Falco, DNP, FNP-BC [facility] Nurse Practitioner   Between 7am to 6pm - Pager [phone]  After 6pm go to [email or web address] - Proofreader  [facility] [place] [facility]  Office  [phone]  CC: Primary care physician; Lowella Bandy, MD  Note: This dictation was prepared with Dragon dictation along with smaller phrase technology. Any transcriptional errors that result from this process are unintentional.

## 2019-01-01 NOTE — Progress Notes (Signed)
MD notified: Sorry to bother you. This is my first time carring for this patient. Did she have significant speech deficit with you yesterday by any chance. She just seems to have trouble completing sentences. I just did not know if that was her baseline due to the CVA or is it was just something new.

## 2019-01-01 NOTE — Progress Notes (Signed)
*  PRELIMINARY RESULTS* Echocardiogram 2D Echocardiogram has been performed.  Cynthia Dean 01/01/2019, 9:03 AM

## 2019-01-01 NOTE — Progress Notes (Signed)
Inpatient Diabetes Program Recommendations  AACE/ADA: New Consensus Statement on Inpatient Glycemic Control   Target Ranges:  Prepandial:   less than 140 mg/dL      Peak postprandial:   less than 180 mg/dL (1-2 hours)      Critically ill patients:  140 - 180 mg/dL   Results for Cynthia Dean, Cynthia Dean (MRN [id number]) as of 01/01/2019 08:27  Ref. Range 12/31/2018 16:39 12/31/2018 22:00 01/01/2019 07:37  Glucose-Capillary Latest Ref Range: 70 - 99 mg/dL 249 (H) 221 (H) 342 (H)   Review of Glycemic Control  Diabetes history: DM2 Outpatient Diabetes medications: 70/30 25 units BID (per home med list - except dialysis days; do not take on Tuesday, Thursdays, or Saturdays) Current orders for Inpatient glycemic control: Novolog 0-9 units TID with meals, Novolog 0-5 units QHS  Inpatient Diabetes Program Recommendations:   Insulin - Basal: Please consider ordering Lantus 13 units Q24H staring now (based on 131 kg x 0-1 units).  Insulin-Meal Coverage: Please consider ordering Novolog 2 units TID with meals if patient eats at least 50% of meals.  NOTE: Noted patient received one time Solumedrol 125 mg at 15:58 on 12/31/18 which is contributing to hyperglycemia.  Thanks, Barnie Alderman, RN, MSN, CDE Diabetes Coordinator Inpatient Diabetes Program [phone] (Team Pager from 8am to 5pm)

## 2019-01-01 NOTE — Plan of Care (Signed)
The patient is confused x3. The patient wanted to go home. She was upset and had changed clothes and was ready to go home. The nurse was able to talk to the patient's daughter and was able to calm the patient down. The patient is calm right now yet may need a tele sitter if she continues to indicate she wants to leave again.   Problem: Education: Goal: Knowledge of General Education information will improve Description Including pain rating scale, medication(s)/side effects and non-pharmacologic comfort measures Outcome: Progressing   Problem: Health Behavior/Discharge Planning: Goal: Ability to manage health-related needs will improve Outcome: Progressing   Problem: Clinical Measurements: Goal: Ability to maintain clinical measurements within normal limits will improve Outcome: Progressing Goal: Will remain free from infection Outcome: Progressing Goal: Diagnostic test results will improve Outcome: Progressing Goal: Respiratory complications will improve Outcome: Progressing Goal: Cardiovascular complication will be avoided Outcome: Progressing   Problem: Activity: Goal: Risk for activity intolerance will decrease Outcome: Progressing   Problem: Nutrition: Goal: Adequate nutrition will be maintained Outcome: Progressing   Problem: Coping: Goal: Level of anxiety will decrease Outcome: Progressing   Problem: Elimination: Goal: Will not experience complications related to bowel motility Outcome: Progressing Goal: Will not experience complications related to urinary retention Outcome: Progressing   Problem: Pain Managment: Goal: General experience of comfort will improve Outcome: Progressing   Problem: Safety: Goal: Ability to remain free from injury will improve Outcome: Progressing   Problem: Skin Integrity: Goal: Risk for impaired skin integrity will decrease Outcome: Progressing

## 2019-01-01 NOTE — Progress Notes (Signed)
HD initiated via L AVG using 15g needles x4 due to difficulty with cannulation due to multiple stents/scar tissue. No heparin. Phosphorous sent to lab as ordered. Patient currently resting without complaints.

## 2019-01-01 NOTE — Progress Notes (Signed)
Nephrologist Notified: Will this patient have dialysis again today her potassium level is 5.8 today, creatine 7.34, calcium 8.4 or will she get something to help lower her potassium level.

## 2019-01-01 NOTE — Progress Notes (Signed)
Patient due for another dialysis session today as K remains high.

## 2019-01-02 DIAGNOSIS — I132 Hypertensive heart and chronic kidney disease with heart failure and with stage 5 chronic kidney disease, or end stage renal disease: Secondary | ICD-10-CM | POA: Diagnosis not present

## 2019-01-02 DIAGNOSIS — R0602 Shortness of breath: Secondary | ICD-10-CM | POA: Diagnosis not present

## 2019-01-02 LAB — RENAL FUNCTION PANEL
Albumin: 3.5 g/dL (ref 3.5–5.0)
Anion gap: 14 (ref 5–15)
BUN: 33 mg/dL — ABNORMAL HIGH (ref 8–23)
CO2: 27 mmol/L (ref 22–32)
Calcium: 8.3 mg/dL — ABNORMAL LOW (ref 8.9–10.3)
Chloride: 95 mmol/L — ABNORMAL LOW (ref 98–111)
Creatinine, Ser: 5.31 mg/dL — ABNORMAL HIGH (ref 0.44–1.00)
GFR calc Af Amer: 9 mL/min — ABNORMAL LOW (ref >60)
GFR calc non Af Amer: 8 mL/min — ABNORMAL LOW (ref >60)
Glucose, Bld: 349 mg/dL — ABNORMAL HIGH (ref 70–99)
Phosphorus: 4.5 mg/dL (ref 2.5–4.6)
Potassium: 4.6 mmol/L (ref 3.5–5.1)
Sodium: 136 mmol/L (ref 135–145)

## 2019-01-02 LAB — CBC
HCT: 28.7 % — ABNORMAL LOW (ref 36.0–46.0)
Hemoglobin: 9 g/dL — ABNORMAL LOW (ref 12.0–15.0)
MCH: 27.7 pg (ref 26.0–34.0)
MCHC: 31.4 g/dL (ref 30.0–36.0)
MCV: 88.3 fL (ref 80.0–100.0)
Platelets: 159 10*3/uL (ref 150–400)
RBC: 3.25 MIL/uL — ABNORMAL LOW (ref 3.87–5.11)
RDW: 14.1 % (ref 11.5–15.5)
WBC: 7.3 10*3/uL (ref 4.0–10.5)
nRBC: 0 % (ref 0.0–0.2)

## 2019-01-02 LAB — GLUCOSE, CAPILLARY
Glucose-Capillary: 273 mg/dL — ABNORMAL HIGH (ref 70–99)
Glucose-Capillary: 224 mg/dL — ABNORMAL HIGH (ref 70–99)

## 2019-01-02 LAB — BASIC METABOLIC PANEL
Anion gap: 14 (ref 5–15)
BUN: 33 mg/dL — ABNORMAL HIGH (ref 8–23)
CO2: 27 mmol/L (ref 22–32)
Calcium: 8.4 mg/dL — ABNORMAL LOW (ref 8.9–10.3)
Chloride: 96 mmol/L — ABNORMAL LOW (ref 98–111)
Creatinine, Ser: 5.27 mg/dL — ABNORMAL HIGH (ref 0.44–1.00)
GFR calc Af Amer: 9 mL/min — ABNORMAL LOW (ref >60)
GFR calc non Af Amer: 8 mL/min — ABNORMAL LOW (ref >60)
Glucose, Bld: 348 mg/dL — ABNORMAL HIGH (ref 70–99)
Potassium: 4.6 mmol/L (ref 3.5–5.1)
Sodium: 137 mmol/L (ref 135–145)

## 2019-01-02 LAB — PROCALCITONIN: Procalcitonin: 0.67 ng/mL

## 2019-01-02 MED ORDER — PREDNISONE 20 MG PO TABS: 40.0000 mg | ORAL_TABLET | Freq: Every day | ORAL | 0 refills | Status: AC

## 2019-01-02 MED ORDER — INSULIN ASPART 100 UNIT/ML ~~LOC~~ SOLN
10.0000 [IU] | Freq: Once | SUBCUTANEOUS | Status: AC
  Administered 2019-01-02: 07:00:00 10 [IU] via SUBCUTANEOUS
  Filled 2019-01-02: qty 1

## 2019-01-02 MED ORDER — PATIROMER SORBITEX CALCIUM 8.4 G PO PACK: 8.4000 g | PACK | Freq: Every day | ORAL | 0 refills | Status: DC

## 2019-01-02 MED ORDER — INSULIN ASPART 100 UNIT/ML ~~LOC~~ SOLN
8.0000 [IU] | Freq: Once | SUBCUTANEOUS | Status: AC
  Administered 2019-01-02: 06:00:00 8 [IU] via SUBCUTANEOUS
  Filled 2019-01-02: qty 1

## 2019-01-02 MED ORDER — INSULIN ASPART 100 UNIT/ML ~~LOC~~ SOLN: 0.0000 [IU] | Freq: Three times a day (TID) | SUBCUTANEOUS | Status: DC

## 2019-01-02 NOTE — Progress Notes (Signed)
Patient's blood sugar from lab draw was 349 at 0400. I paged hospitalist and Dr Sidney Ace said to give 8 units of novolog and check hourly blood sugars for 2 hours. Reck blood sugar at 0650 was 273. I paged hospitalist and Dr Brett Albino ordered 10 units of novolog.

## 2019-01-02 NOTE — Progress Notes (Addendum)
Inpatient Diabetes Program Recommendations  AACE/ADA: New Consensus Statement on Inpatient Glycemic Control   Target Ranges:  Prepandial:   less than 140 mg/dL      Peak postprandial:   less than 180 mg/dL (1-2 hours)      Critically ill patients:  140 - 180 mg/dL   Results for TERIN, DIEROLF (MRN [id number]) as of 01/02/2019 09:03  Ref. Range 01/01/2019 07:37 01/01/2019 17:46 01/01/2019 21:13 01/02/2019 06:50 01/02/2019 07:56  Glucose-Capillary Latest Ref Range: 70 - 99 mg/dL 342 (H) 161 (H) 218 (H) 273 (H) 224 (H)   Review of Glycemic Control  Diabetes history: DM2 Outpatient Diabetes medications: 70/30 25 units BID(per home med list - except dialysis days; do not take on Tuesday, Thursdays, or Saturdays) Current orders for Inpatient glycemic control:Lantus 13 units daily, Novolog 2 units TID with meals, Novolog 0-9 units TID with meals, Novolog 0-5 units QHS; Solumedrol 40 mg Q12H  Inpatient Diabetes Program Recommendations:   Insulin - Basal: If steroids are continued, please consider increasing Lantus to 20 units daily.  Insulin-Meal Coverage: Please consider increasing meal coverage to Novolog 6 units TID with meals if patient eats at least 50% of meals.  Thanks, Barnie Alderman, RN, MSN, CDE Diabetes Coordinator Inpatient Diabetes Program [phone] (Team Pager from 8am to 5pm)

## 2019-01-02 NOTE — Discharge Summary (Signed)
[facility] NAME: Cynthia Dean    MR#:  [id number]  [place]:  1951/02/17  DATE OF ADMISSION:  12/31/2018   ADMITTING PHYSICIAN: Sela Hua, MD  DATE OF DISCHARGE: 01/02/19  PRIMARY CARE PHYSICIAN: Lowella Bandy, MD   ADMISSION DIAGNOSIS:   Acute respiratory failure with hypoxia (HCC) [J96.01] Hypervolemia, unspecified hypervolemia type [E87.70]  DISCHARGE DIAGNOSIS:   Active Problems:   Acute respiratory failure with hypoxia ([place])  SECONDARY DIAGNOSIS:   Past Medical History:  Diagnosis Date  . COPD (chronic obstructive pulmonary disease) ([place])   . Coronary artery disease   . Diabetes mellitus without complication ([place])   . ESRD on dialysis ([place])   . Heart murmur   . Hyperlipidemia   . Hypertension   . Stroke [facility])    HOSPITAL COURSE:   68 y.o. female 68 y.o.femalewith pertinent past medical history ofCVA with residual effect, partial seizure, CAD, hypertension, ESRD on dialysis- T-TH-S, COPD, type 2 diabetes mellitus, with long-term current use of insulin, diastolic heart failure and hyperlipidemia presenting to the ED with chief complaints of worsening shortness of breath, gradual onset over the last 2 days.  1.Acute respiratory failure with hypoxia-likely secondary to fluid overload in a patient with history of ESRD missed dialysis today, COPD and diastolic heart failure. -Chest x-ray reviewed and shows pulmonary edema received IV Lasix in ED -COVID-19negative - Received a dose of solumedrol. Discharge with po prednisone 40 mg x 3 days  2.Acute on chronic kidney disease -ESRD on dialysis- T-TH-S.s/p HD 12/31/18 and 01/01/19 - Continue HD schedule  3.Acute on chronicdiastolic congestive Heart Failure: Acute presentation likely due to volume overload with associated symptoms of SOB. BNP elevated at528 -Chest x-ray showed pulmonary edema as above Last Echotoday  withLVEF55-60% - Beta-Blockade: Metoprolol - Diuretics:Continue Torsemide. - Low salt diet  4. Hyperkalemia - due to ESRD.  - continue Veltassa at discharge -Follow-up with PCP for management.  5.Chronic COPD- No evidence of exacerbationuses O2 as 2L at home - Continue home inhalers - Prednisone as above  6. Hxof CVA with residual aphasia  -Continue dual therapy Aspirin 81 mg/day and Plavix 75 mg /day - Repeat MRI brain today showed no acute intracranial abnormality  7.Coronary artery disease  - ASA 81mg  PO daily - Clopidogrel 75mg  PO daily  - HTN, HLD, DM control as below -Follows with cardiology Dr. Rockey Situ on outpatient basis  8.HLD + Goal LDL<70 - Atorvastatin 80mg  PO qhs  9.HTN- stable + Goal BP <130/80 - Beta-blocker: Metoprolol -Continue amlodipine - ACE-Inhibitor:Losartan  10.DM -Insulindependent at home - Resume home med  DISCHARGE CONDITIONS:  Stable  CONSULTS OBTAINED:   Treatment Team:  Anthonette Legato, MD  DRUG ALLERGIES:   Allergies  Allergen Reactions  . Tape Itching    Skin Dermatitis/itching (tape adhesive) Skin Dermatitis/itching (tape adhesive)  . Tapentadol Itching    Skin Dermatitis/itching (tape adhesive) Skin Dermatitis/itching (tape adhesive)    DISCHARGE MEDICATIONS:   Allergies as of 01/02/2019      Reactions   Tape Itching   Skin Dermatitis/itching (tape adhesive) Skin Dermatitis/itching (tape adhesive)   Tapentadol Itching   Skin Dermatitis/itching (tape adhesive) Skin Dermatitis/itching (tape adhesive)      Medication List    TAKE these medications   acetaminophen 325 MG tablet Commonly known as:  TYLENOL Take 2 tablets (650 mg total) by mouth every 4 (four) hours as needed for mild pain (or temp > 37.5 C (99.5  F)).   amLODipine 5 MG tablet Commonly known as:  NORVASC Take 5 mg by mouth 2 (two) times daily.   aspirin EC 325 MG tablet Take 325 mg by mouth daily.   atorvastatin 80  MG tablet Commonly known as:  LIPITOR Take 80 mg by mouth every evening.   clopidogrel 75 MG tablet Commonly known as:  PLAVIX TAKE ONE TABLET BY MOUTH ONCE DAILY   gabapentin 100 MG capsule Commonly known as:  NEURONTIN Take 100 mg by mouth 3 (three) times daily.   insulin NPH-regular Human (70-30) 100 UNIT/ML injection Inject 25 Units into the skin 2 (two) times daily with a meal. Except dialysis days. Do not take on Tuesdays, Thursdays, and Saturdays.   levETIRAcetam 250 MG tablet Commonly known as:  KEPPRA Take 1 tablet (250 mg total) by mouth 3 (three) times a week. Take after dialysis on Monday, wed and Friday in addition to regular dose.   levETIRAcetam 1000 MG tablet Commonly known as:  KEPPRA Take 1 tablet (1,000 mg total) by mouth daily.   lidocaine-prilocaine cream Commonly known as:  EMLA Apply 1 application topically as needed (for port access).   losartan 100 MG tablet Commonly known as:  COZAAR Take 100 mg by mouth at bedtime.   metoprolol succinate 100 MG 24 hr tablet Commonly known as:  TOPROL-XL Take 100 mg by mouth daily. Take with or immediately following a meal.   omeprazole 20 MG capsule Commonly known as:  PRILOSEC Take 20 mg by mouth 2 (two) times daily.   patiromer 8.4 g packet Commonly known as:  VELTASSA Take 1 packet (8.4 g total) by mouth daily.   predniSONE 20 MG tablet Commonly known as:  Deltasone Take 2 tablets (40 mg total) by mouth daily with breakfast for 3 days. Start taking on:  January 03, 2019   torsemide 100 MG tablet Commonly known as:  DEMADEX Take 100 mg by mouth daily.      DISCHARGE INSTRUCTIONS:   DIET:   Renal diet  ACTIVITY:   Activity as tolerated  OXYGEN:   Home Oxygen: Yes.    Oxygen Delivery: 2 liters/min via Patient connected to nasal cannula oxygen  DISCHARGE LOCATION:   home   If you experience worsening of your admission symptoms, develop shortness of breath, life threatening emergency,  suicidal or homicidal thoughts you must seek medical attention immediately by calling 911 or calling your MD immediately  if symptoms less severe.  You Must read complete instructions/literature along with all the possible adverse reactions/side effects for all the Medicines you take and that have been prescribed to you. Take any new Medicines after you have completely understood and accpet all the possible adverse reactions/side effects.   Please note You were cared for by a hospitalist during your hospital stay. If you have any questions about your discharge medications or the care you received while you were in the hospital after you are discharged, you can call the unit and asked to speak with the hospitalist on call if the hospitalist that took care of you is not available. Once you are discharged, your primary care physician will handle any further medical issues. Please note that NO REFILLS for any discharge medications will be authorized once you are discharged, as it is imperative that you return to your primary care physician (or establish a relationship with a primary care physician if you do not have one) for your aftercare needs so that they can reassess your need for medications  and monitor your lab values.  On the day of Discharge:  VITAL SIGNS:   Blood pressure (!) 167/66, pulse 73, temperature 98.7 F (37.1 C), temperature source Oral, resp. rate 18, height 5\' 9"  (1.753 m), weight 132 kg, SpO2 96 %.  PHYSICAL EXAMINATION:   GENERAL:68 y.o.-year-old patient lying in the bed with no acute distress.  EYES: Pupils equal, round, reactive to light and accommodation. No scleral icterus. Extraocular muscles intact.  HEENT: Head atraumatic, normocephalic. Oropharynx and nasopharynx clear.  NECK: Supple, no jugular venous distention. No thyroid enlargement, no tenderness.  LUNGS:Decreasedbreath sounds bilaterally, mildwheezing withrales andrhonchi. Nocrepitation. No use of  accessory muscles of respiration.  CARDIOVASCULAR: S1, S2 normal. murmurs,Norubs, or gallops.  ABDOMEN: Soft, nontender, nondistended. Bowel sounds present. No organomegaly or mass.  EXTREMITIES: No pedal edema, cyanosis, or clubbing. No rash or lesions. + pedal pulses MUSCULOSKELETAL: Normal bulk, and power was 5+ grip and elbow, knee, and ankle flexion and extension bilaterally.  NEUROLOGIC:Alert and oriented x 3. CN 2-12 intact.Milddysarthria, mildexpressive aphasiaSensation to light touch and cold stimuli intact bilaterally. Finger to nose nl. Babinski is downgoing. DTR's (biceps, patellar, and achilles) 2+ and symmetric throughout. Gait not tested due to safety concern. PSYCHIATRIC: The patient is alert and oriented x 3.  SKIN: No obvious rash, lesion, or ulcer.   DATA REVIEW:   CBC Recent Labs  Lab 01/02/19 0400  WBC 7.3  HGB 9.0*  HCT 28.7*  PLT 159    Chemistries  Recent Labs  Lab 12/31/18 0818  01/02/19 0400  NA 139   < > 136  137  K 5.9*   < > 4.6  4.6  CL 104   < > 95*  96*  CO2 23   < > 27  27  GLUCOSE 237*   < > 349*  348*  BUN 50*   < > 33*  33*  CREATININE 8.72*   < > 5.31*  5.27*  CALCIUM 8.2*   < > 8.3*  8.4*  MG 1.8  --   --   AST 14*  --   --   ALT 14  --   --   ALKPHOS 137*  --   --   BILITOT 0.7  --   --    < > = values in this interval not displayed.     Microbiology Results  Results for orders placed or performed during the hospital encounter of 12/31/18  SARS Coronavirus 2 (CEPHEID- Performed in [place] hospital lab), Hosp Order     Status: None   Collection Time: 12/31/18  8:42 AM  Result Value Ref Range Status   SARS Coronavirus 2 NEGATIVE NEGATIVE Final    Comment: (NOTE) If result is NEGATIVE SARS-CoV-2 target nucleic acids are NOT DETECTED. The SARS-CoV-2 RNA is generally detectable in upper and lower  respiratory specimens during the acute phase of infection. The lowest  concentration of SARS-CoV-2 viral copies this  assay can detect is 250  copies / mL. A negative result does not preclude SARS-CoV-2 infection  and should not be used as the sole basis for treatment or other  patient management decisions.  A negative result may occur with  improper specimen collection / handling, submission of specimen other  than nasopharyngeal swab, presence of viral mutation(s) within the  areas targeted by this assay, and inadequate number of viral copies  (<250 copies / mL). A negative result must be combined with clinical  observations, patient history, and epidemiological information. If result  is POSITIVE SARS-CoV-2 target nucleic acids are DETECTED. The SARS-CoV-2 RNA is generally detectable in upper and lower  respiratory specimens dur ing the acute phase of infection.  Positive  results are indicative of active infection with SARS-CoV-2.  Clinical  correlation with patient history and other diagnostic information is  necessary to determine patient infection status.  Positive results do  not rule out bacterial infection or co-infection with other viruses. If result is PRESUMPTIVE POSTIVE SARS-CoV-2 nucleic acids MAY BE PRESENT.   A presumptive positive result was obtained on the submitted specimen  and confirmed on repeat testing.  While 2019 novel coronavirus  (SARS-CoV-2) nucleic acids may be present in the submitted sample  additional confirmatory testing may be necessary for epidemiological  and / or clinical management purposes  to differentiate between  SARS-CoV-2 and other Sarbecovirus currently known to infect humans.  If clinically indicated additional testing with an alternate test  methodology [phone]) is advised. The SARS-CoV-2 RNA is generally  detectable in upper and lower respiratory sp ecimens during the acute  phase of infection. The expected result is Negative. Fact Sheet for Patients:  StrictlyIdeas.no Fact Sheet for Healthcare Providers:  BankingDealers.co.za This test is not yet approved or cleared by the [place] FDA and has been authorized for detection and/or diagnosis of SARS-CoV-2 by FDA under an Emergency Use Authorization (EUA).  This EUA will remain in effect (meaning this test can be used) for the duration of the COVID-19 declaration under Section 564(b)(1) of the Act, 21 U.S.C. section 360bbb-3(b)(1), unless the authorization is terminated or revoked sooner. Performed at [facility], [address] [place], [place] [address]   MRSA PCR Screening     Status: Abnormal   Collection Time: 01/01/19 12:41 AM  Result Value Ref Range Status   MRSA by PCR POSITIVE (A) NEGATIVE Final    Comment:        The GeneXpert MRSA Assay (FDA approved for NASAL specimens only), is one component of a comprehensive MRSA colonization surveillance program. It is not intended to diagnose MRSA infection nor to guide or monitor treatment for MRSA infections. RESULT CALLED TO, READ BACK BY AND VERIFIED WITH: STACEY CLAY AT 0234 ON 01/01/2019 JJB Performed at [facility], [address] [place],  [address]     RADIOLOGY:  Mr Herby Abraham Contrast  Result Date: 01/01/2019 CLINICAL DATA:  History of CVA.  Slurred speech. EXAM: MRI HEAD WITHOUT CONTRAST TECHNIQUE: Multiplanar, multiecho pulse sequences of the brain and surrounding structures were obtained without intravenous contrast. COMPARISON:  CT head 10/03/2018. MR head 05/29/2018. FINDINGS: Brain: No evidence for acute infarction, hemorrhage, mass lesion, or extra-axial fluid. Chronic RIGHT frontal operculum infarct. Chronic LEFT frontal subcortical infarcts. Chronic LEFT posterior temporal occipital infarct. Generalized atrophy, premature for age. Hydrocephalus ex vacuo. Chronic BILATERAL cerebellar infarcts. Chronic LEFT basal ganglia infarct. Extensive chronic microvascular ischemic change. Vascular: Flow voids are maintained in  the carotid, basilar, and both vertebral arteries. Skull and upper cervical spine: Normal marrow signal. Sinuses/Orbits: Negative. Other: None. IMPRESSION: Atrophy and small vessel disease. Evidence of multifocal chronic ischemia. No acute intracranial findings. Electronically Signed   By: Staci Righter M.D.   On: 01/01/2019 12:32     Management plans discussed with the patient, family and they are in agreement.  CODE STATUS:     Code Status Orders  (From admission, onward)         Start     Ordered   12/31/18 1158  Full  code  Continuous     12/31/18 1159        Code Status History    Date Active Date Inactive Code Status Order ID Comments User Context   05/29/2018 1541 05/30/2018 2041 Full Code [id number]  Loletha Grayer, MD ED   03/14/2018 0141 03/15/2018 0336 Full Code [id number]  Amelia Jo, MD Inpatient   03/08/2018 0005 03/09/2018 2108 Full Code [id number]  Vaughan Basta, MD Inpatient   11/15/2017 0017 11/17/2017 1809 Full Code [id number]  Lance Coon, MD ED   11/12/2017 2219 11/13/2017 1935 Full Code [id number]  Lance Coon, MD ED   04/14/2016 1330 04/14/2016 1700 Full Code [id number]  Bettey Costa, MD ED   08/08/2015 2257 08/09/2015 2054 Full Code [id number]  Hower, Aaron Mose, MD ED   04/17/2015 1334 04/17/2015 1836 Full Code [id number]  Dew, Erskine Squibb, MD Inpatient      TOTAL TIME TAKING CARE OF THIS PATIENT: 35 minutes.   This patient was staffed with Dr. Valetta Fuller, [facility] who personally evaluated patient, reviewed documentation and agreed with discharge plan of care as above.  Rufina Falco, DNP, FNP-BC Hospitalist Nurse Practitioner  01/02/2019 at 9:06 AM  Between 7am to 6pm - Pager - [phone]  After 6pm go to [email or web address] - Proofreader  [facility]  Office  [phone]  CC: Primary care physician; Lowella Bandy, MD   Note: This dictation was prepared with Dragon dictation along with smaller phrase technology. Any  transcriptional errors that result from this process are unintentional.

## 2019-01-08 ENCOUNTER — Telehealth: Payer: Self-pay

## 2019-01-08 NOTE — Telephone Encounter (Signed)
Called patient to change appt to in office.  No answer. LMOV.

## 2019-01-10 NOTE — Telephone Encounter (Signed)
Called patient.  No answer. LMOV.

## 2019-01-16 ENCOUNTER — Ambulatory Visit (INDEPENDENT_AMBULATORY_CARE_PROVIDER_SITE_OTHER): Payer: Medicare HMO | Admitting: *Deleted

## 2019-01-16 DIAGNOSIS — I6310 Cerebral infarction due to embolism of unspecified precerebral artery: Secondary | ICD-10-CM

## 2019-01-17 LAB — CUP PACEART REMOTE DEVICE CHECK
Date Time Interrogation Session: 20200624193916
Implantable Pulse Generator Implant Date: 20190425

## 2019-01-19 NOTE — Progress Notes (Deleted)
Cardiology Office Note  Date:  01/19/2019   ID:  Cynthia Dean, Cynthia Dean 14-Mar-1951, MRN [id number]  PCP:  Lowella Bandy, MD   No chief complaint on file.   HPI:  Cynthia Dean is a 68 y.o. female who presents for Hypertension Diabetes Hyperlipidemia Coronary disease Aortic atherosclerosis CVA Recent hospitalization November 12 2017 percent he was slurred speech New small thalamic stroke Prior stroke 1 year earlier End-stage renal disease on hemodialysis She had TEE and loop monitor placement She presents for follow-up for stroke after recent hospitalization  She presented to the hospita lApril 2019 with Slurred speech,  Prior MRI showed punctate thalamic stroke, which on radiology read read acute punctate stroke versus artifact.  Repeat MRI no acute abnormalities continue aspirin and Plavix  TEE done4/25/19 -normal LV function no mural apical thrombus, no regional wall motion abnormalities normal LV function estimated EF 55%  LINQ placed, outpatient follow-up with [facility] cardiology as recommended by them  EEG-abnormal EEG secondary to intermittent periods of left hemisphericslowing and independent left and right temporal sharp transients. Findings consistent with the patient's history of bilateral MCA infarcts but may also suggest epileptogenic potential  HBA1C 7.1 Total chol 90  She feels back to normal, speech has improved Presenting in a wheelchair, legs feel weak, chronic baseline weakness  EKG personally reviewed by myself on todays visit Shows normal sinus rhythm rate 69 bpm no significant ST or T-wave changes   PMH:   has a past medical history of COPD (chronic obstructive pulmonary disease) ([place]), Coronary artery disease, Diabetes mellitus without complication ([place]), ESRD on dialysis ([place]), Heart murmur, Hyperlipidemia, Hypertension, and Stroke ([place]).  PSH:    Past Surgical History:  Procedure Laterality Date  . A/V  FISTULAGRAM Left 12/20/2016   Procedure: A/V Fistulagram;  Surgeon: Katha Cabal, MD;  Location: [place] [facility];  Service: Cardiovascular;  Laterality: Left;  . A/V SHUNT INTERVENTION N/A 12/20/2016   Procedure: A/V Shunt Intervention;  Surgeon: Katha Cabal, MD;  Location: [place] [facility];  Service: Cardiovascular;  Laterality: N/A;  . A/V SHUNTOGRAM Left 09/11/2017   Procedure: A/V SHUNTOGRAM;  Surgeon: Algernon Huxley, MD;  Location: [place] [facility];  Service: Cardiovascular;  Laterality: Left;  . BREAST BIOPSY Bilateral 07/19/00   neg  . LOOP RECORDER INSERTION N/A 11/16/2017   Procedure: LOOP RECORDER INSERTION;  Surgeon: Deboraha Sprang, MD;  Location: [place] [facility];  Service: Cardiovascular;  Laterality: N/A;  . PERIPHERAL VASCULAR CATHETERIZATION Left 02/02/2015   Procedure: A/V Shuntogram/Fistulagram;  Surgeon: Algernon Huxley, MD;  Location: [place] [facility];  Service: Cardiovascular;  Laterality: Left;  . PERIPHERAL VASCULAR CATHETERIZATION Left 02/02/2015   Procedure: A/V Shunt Intervention;  Surgeon: Algernon Huxley, MD;  Location: [place] [facility];  Service: Cardiovascular;  Laterality: Left;  . PERIPHERAL VASCULAR CATHETERIZATION Left 03/09/2015   Procedure: A/V Shuntogram/Fistulagram;  Surgeon: Algernon Huxley, MD;  Location: [place] [facility];  Service: Cardiovascular;  Laterality: Left;  . PERIPHERAL VASCULAR CATHETERIZATION N/A 03/09/2015   Procedure: A/V Shunt Intervention;  Surgeon: Algernon Huxley, MD;  Location: [place] [facility];  Service: Cardiovascular;  Laterality: N/A;  . PERIPHERAL VASCULAR CATHETERIZATION Left 04/17/2015   Procedure: Upper Extremity Angiography;  Surgeon: Algernon Huxley, MD;  Location: [place] [facility];  Service: Cardiovascular;  Laterality: Left;  . PERIPHERAL VASCULAR CATHETERIZATION  04/17/2015   Procedure: Upper Extremity Intervention;  Surgeon: Algernon Huxley,  MD;  Location: [place] [facility];  Service: Cardiovascular;;  .  PERIPHERAL VASCULAR CATHETERIZATION N/A 08/10/2015   Procedure: A/V Shuntogram/Fistulagram;  Surgeon: Algernon Huxley, MD;  Location: [place] [facility];  Service: Cardiovascular;  Laterality: N/A;  . PERIPHERAL VASCULAR CATHETERIZATION N/A 08/10/2015   Procedure: A/V Shunt Intervention;  Surgeon: Algernon Huxley, MD;  Location: [place] [facility];  Service: Cardiovascular;  Laterality: N/A;  . PERIPHERAL VASCULAR CATHETERIZATION Left 04/11/2016   Procedure: A/V Shuntogram/Fistulagram;  Surgeon: Algernon Huxley, MD;  Location: [place] [facility];  Service: Cardiovascular;  Laterality: Left;  . TEE WITHOUT CARDIOVERSION N/A 11/16/2017   Procedure: TRANSESOPHAGEAL ECHOCARDIOGRAM (TEE);  Surgeon: Minna Merritts, MD;  Location: [facility];  Service: Cardiovascular;  Laterality: N/A;    Current Outpatient Medications  Medication Sig Dispense Refill  . acetaminophen (TYLENOL) 325 MG tablet Take 2 tablets (650 mg total) by mouth every 4 (four) hours as needed for mild pain (or temp > 37.5 C (99.5 F)).    [name] amLODipine (NORVASC) 5 MG tablet Take 5 mg by mouth 2 (two) times daily.    [name] aspirin EC 325 MG tablet Take 325 mg by mouth daily.    [name] atorvastatin (LIPITOR) 80 MG tablet Take 80 mg by mouth every evening.     . clopidogrel (PLAVIX) 75 MG tablet TAKE ONE TABLET BY MOUTH ONCE DAILY (Patient taking differently: Take 75 mg by mouth daily. ) 30 tablet 5  . gabapentin (NEURONTIN) 100 MG capsule Take 100 mg by mouth 3 (three) times daily.    . insulin NPH-regular Human (NOVOLIN 70/30) (70-30) 100 UNIT/ML injection Inject 25 Units into the skin 2 (two) times daily with a meal. Except dialysis days. Do not take on Tuesdays, Thursdays, and Saturdays. 10 mL 11  . levETIRAcetam (KEPPRA) 1000 MG tablet Take 1 tablet (1,000 mg total) by mouth daily. 30 tablet 0  . levETIRAcetam (KEPPRA) 250 MG tablet Take 1 tablet (250 mg total) by mouth 3 (three) times a week. Take after dialysis on Monday, wed and Friday in addition to  regular dose. 15 tablet 0  . lidocaine-prilocaine (EMLA) cream Apply 1 application topically as needed (for port access).     [name] losartan (COZAAR) 100 MG tablet Take 100 mg by mouth at bedtime.     . metoprolol succinate (TOPROL-XL) 100 MG 24 hr tablet Take 100 mg by mouth daily. Take with or immediately following a meal.     . omeprazole (PRILOSEC) 20 MG capsule Take 20 mg by mouth 2 (two) times daily.     [name] torsemide (DEMADEX) 100 MG tablet Take 100 mg by mouth daily.     No current facility-administered medications for this visit.      Allergies:   Tape and Tapentadol   Social History:  The patient  reports that she has quit smoking. Her smoking use included cigarettes. She smoked 0.00 packs per day. She has never used smokeless tobacco. She reports that she does not drink alcohol or use drugs.   Family History:   family history includes Breast cancer (age of onset: 30) in her cousin; Breast cancer (age of onset: 88) in her father; Heart Problems in her sister; Heart attack in her mother; Stroke in her mother.    Review of Systems: Review of Systems  Constitutional: Negative.   Respiratory: Negative.   Cardiovascular: Negative.   Gastrointestinal: Negative.   Musculoskeletal: Negative.        Leg weakness  Neurological:  Negative.   Psychiatric/Behavioral: Negative.   All other systems reviewed and are negative.   PHYSICAL EXAM: VS:  There were no vitals taken for this visit. , BMI There is no height or weight on file to calculate BMI. Constitutional:  oriented to person, place, and time. No distress. obese, presenting a wheelchair HENT:  Head: Normocephalic and atraumatic.  Eyes:  no discharge. No scleral icterus.  Neck: Normal range of motion. Neck supple. No JVD present.  Cardiovascular: Normal rate, regular rhythm, normal heart sounds and intact distal pulses. Exam reveals no gallop and no friction rub. No edema No murmur heard. Pulmonary/Chest: Effort normal and breath  sounds normal. No stridor. No respiratory distress.  no wheezes.  no rales.  no tenderness.  Abdominal: Soft.  no distension.  no tenderness.  Musculoskeletal: Normal range of motion.  no  tenderness or deformity.  Neurological:  normal muscle tone. Coordination normal. No atrophy Skin: Skin is warm and dry. No rash noted. not diaphoretic.  Psychiatric:  normal mood and affect. behavior is normal. Thought content normal.     Recent Labs: 12/31/2018: ALT 14; B Natriuretic Peptide 528.0; Magnesium 1.8 01/02/2019: BUN 33; BUN 33; Creatinine, Ser 5.31; Creatinine, Ser 5.27; Hemoglobin 9.0; Platelets 159; Potassium 4.6; Potassium 4.6; Sodium 136; Sodium 137    Lipid Panel Lab Results  Component Value Date   CHOL 202 (H) 03/07/2018   HDL 46 03/07/2018   LDLCALC 112 (H) 03/07/2018   TRIG 222 (H) 03/07/2018      Wt Readings from Last 3 Encounters:  01/01/19 291 lb 0.1 oz (132 kg)  08/12/18 230 lb (104.3 kg)  05/30/18 258 lb 13.1 oz (117.4 kg)       ASSESSMENT AND PLAN:  Cerebrovascular accident (CVA) due to embolism of precerebral artery ([place]) - Plan: EKG 12-Lead Recent transesophageal echo with loop placement Periodic download, remote  ESRD on dialysis ([place]) Fluid managed by hemodialysis Prior echocardiogram with markedly elevated right heart pressures Denies symptoms on today's visit On torsemide daily  Type 2 diabetes mellitus with other circulatory complication, unspecified whether long term insulin use ([place]) We have encouraged continued exercise, careful diet management in an effort to lose weight.  Essential hypertension, benign Blood pressure is well controlled on today's visit. No changes made to the medications. She will monitor blood pressure at home and watch for orthostasis symptoms  Disposition:   F/U as needed She will follow-up with Dr. Caryl Comes  Details of recent hospitalization discussed with her  Total encounter time more than 45 minutes  Greater than 50%  was spent in counseling and coordination of care with the patient    No orders of the defined types were placed in this encounter.    Signed, Esmond Plants, M.D., Ph.D. 01/19/2019  [place], [place]

## 2019-01-21 ENCOUNTER — Telehealth: Payer: Medicare HMO | Admitting: Cardiovascular Disease

## 2019-01-21 ENCOUNTER — Other Ambulatory Visit: Payer: Self-pay

## 2019-01-28 NOTE — Progress Notes (Signed)
[facility]link Summary Report / Loop Recorder 

## 2019-01-29 ENCOUNTER — Other Ambulatory Visit: Payer: Self-pay | Admitting: Pediatrics

## 2019-01-29 DIAGNOSIS — R0602 Shortness of breath: Secondary | ICD-10-CM

## 2019-02-13 ENCOUNTER — Ambulatory Visit (INDEPENDENT_AMBULATORY_CARE_PROVIDER_SITE_OTHER)
Admission: EM | Admit: 2019-02-13 | Discharge: 2019-02-13 | Disposition: A | Discharge status: Home / Self Care | Payer: Medicare HMO | Source: Home / Self Care

## 2019-02-13 ENCOUNTER — Other Ambulatory Visit: Payer: Self-pay

## 2019-02-13 ENCOUNTER — Inpatient Hospital Stay
Admission: EM | Admit: 2019-02-13 | Discharge: 2019-02-16 | DRG: 393 | Disposition: A | Discharge status: Home / Self Care | Payer: Medicare HMO | Source: Other Acute Inpatient Hospital | Attending: Internal Medicine | Admitting: Internal Medicine

## 2019-02-13 DIAGNOSIS — Z8711 Personal history of peptic ulcer disease: Secondary | ICD-10-CM

## 2019-02-13 DIAGNOSIS — E875 Hyperkalemia: Secondary | ICD-10-CM | POA: Diagnosis present

## 2019-02-13 DIAGNOSIS — Z885 Allergy status to narcotic agent status: Secondary | ICD-10-CM | POA: Diagnosis not present

## 2019-02-13 DIAGNOSIS — S91102A Unspecified open wound of left great toe without damage to nail, initial encounter: Secondary | ICD-10-CM

## 2019-02-13 DIAGNOSIS — E785 Hyperlipidemia, unspecified: Secondary | ICD-10-CM | POA: Diagnosis present

## 2019-02-13 DIAGNOSIS — Z6841 Body Mass Index (BMI) 40.0 and over, adult: Secondary | ICD-10-CM | POA: Diagnosis not present

## 2019-02-13 DIAGNOSIS — Z823 Family history of stroke: Secondary | ICD-10-CM

## 2019-02-13 DIAGNOSIS — N2581 Secondary hyperparathyroidism of renal origin: Secondary | ICD-10-CM | POA: Diagnosis present

## 2019-02-13 DIAGNOSIS — K625 Hemorrhage of anus and rectum: Secondary | ICD-10-CM | POA: Diagnosis present

## 2019-02-13 DIAGNOSIS — K921 Melena: Secondary | ICD-10-CM

## 2019-02-13 DIAGNOSIS — G5602 Carpal tunnel syndrome, left upper limb: Secondary | ICD-10-CM | POA: Diagnosis present

## 2019-02-13 DIAGNOSIS — X58XXXA Exposure to other specified factors, initial encounter: Secondary | ICD-10-CM | POA: Diagnosis present

## 2019-02-13 DIAGNOSIS — Z7982 Long term (current) use of aspirin: Secondary | ICD-10-CM

## 2019-02-13 DIAGNOSIS — Z79899 Other long term (current) drug therapy: Secondary | ICD-10-CM

## 2019-02-13 DIAGNOSIS — K449 Diaphragmatic hernia without obstruction or gangrene: Secondary | ICD-10-CM | POA: Diagnosis present

## 2019-02-13 DIAGNOSIS — Z8249 Family history of ischemic heart disease and other diseases of the circulatory system: Secondary | ICD-10-CM

## 2019-02-13 DIAGNOSIS — E1122 Type 2 diabetes mellitus with diabetic chronic kidney disease: Secondary | ICD-10-CM | POA: Diagnosis present

## 2019-02-13 DIAGNOSIS — I69320 Aphasia following cerebral infarction: Secondary | ICD-10-CM

## 2019-02-13 DIAGNOSIS — E1151 Type 2 diabetes mellitus with diabetic peripheral angiopathy without gangrene: Secondary | ICD-10-CM | POA: Diagnosis present

## 2019-02-13 DIAGNOSIS — I2510 Atherosclerotic heart disease of native coronary artery without angina pectoris: Secondary | ICD-10-CM | POA: Diagnosis present

## 2019-02-13 DIAGNOSIS — K559 Vascular disorder of intestine, unspecified: Principal | ICD-10-CM | POA: Diagnosis present

## 2019-02-13 DIAGNOSIS — Z7902 Long term (current) use of antithrombotics/antiplatelets: Secondary | ICD-10-CM | POA: Diagnosis not present

## 2019-02-13 DIAGNOSIS — G4733 Obstructive sleep apnea (adult) (pediatric): Secondary | ICD-10-CM | POA: Diagnosis present

## 2019-02-13 DIAGNOSIS — D631 Anemia in chronic kidney disease: Secondary | ICD-10-CM | POA: Diagnosis present

## 2019-02-13 DIAGNOSIS — K2970 Gastritis, unspecified, without bleeding: Secondary | ICD-10-CM | POA: Diagnosis present

## 2019-02-13 DIAGNOSIS — K3189 Other diseases of stomach and duodenum: Secondary | ICD-10-CM | POA: Diagnosis present

## 2019-02-13 DIAGNOSIS — J449 Chronic obstructive pulmonary disease, unspecified: Secondary | ICD-10-CM | POA: Diagnosis present

## 2019-02-13 DIAGNOSIS — Z9115 Patient's noncompliance with renal dialysis: Secondary | ICD-10-CM

## 2019-02-13 DIAGNOSIS — G4089 Other seizures: Secondary | ICD-10-CM | POA: Diagnosis present

## 2019-02-13 DIAGNOSIS — E1140 Type 2 diabetes mellitus with diabetic neuropathy, unspecified: Secondary | ICD-10-CM | POA: Diagnosis present

## 2019-02-13 DIAGNOSIS — E11319 Type 2 diabetes mellitus with unspecified diabetic retinopathy without macular edema: Secondary | ICD-10-CM | POA: Diagnosis present

## 2019-02-13 DIAGNOSIS — K222 Esophageal obstruction: Secondary | ICD-10-CM | POA: Diagnosis present

## 2019-02-13 DIAGNOSIS — Z87891 Personal history of nicotine dependence: Secondary | ICD-10-CM

## 2019-02-13 DIAGNOSIS — N186 End stage renal disease: Secondary | ICD-10-CM | POA: Diagnosis present

## 2019-02-13 DIAGNOSIS — I132 Hypertensive heart and chronic kidney disease with heart failure and with stage 5 chronic kidney disease, or end stage renal disease: Secondary | ICD-10-CM | POA: Diagnosis present

## 2019-02-13 DIAGNOSIS — I5032 Chronic diastolic (congestive) heart failure: Secondary | ICD-10-CM | POA: Diagnosis present

## 2019-02-13 DIAGNOSIS — Z91048 Other nonmedicinal substance allergy status: Secondary | ICD-10-CM

## 2019-02-13 DIAGNOSIS — Z803 Family history of malignant neoplasm of breast: Secondary | ICD-10-CM | POA: Diagnosis not present

## 2019-02-13 DIAGNOSIS — D62 Acute posthemorrhagic anemia: Secondary | ICD-10-CM | POA: Diagnosis present

## 2019-02-13 DIAGNOSIS — Z794 Long term (current) use of insulin: Secondary | ICD-10-CM | POA: Diagnosis not present

## 2019-02-13 DIAGNOSIS — S91109A Unspecified open wound of unspecified toe(s) without damage to nail, initial encounter: Secondary | ICD-10-CM

## 2019-02-13 DIAGNOSIS — Z20828 Contact with and (suspected) exposure to other viral communicable diseases: Secondary | ICD-10-CM | POA: Diagnosis present

## 2019-02-13 DIAGNOSIS — Z992 Dependence on renal dialysis: Secondary | ICD-10-CM

## 2019-02-13 DIAGNOSIS — Z9981 Dependence on supplemental oxygen: Secondary | ICD-10-CM

## 2019-02-13 DIAGNOSIS — K644 Residual hemorrhoidal skin tags: Secondary | ICD-10-CM | POA: Diagnosis present

## 2019-02-13 LAB — MRSA PCR SCREENING: MRSA by PCR: NEGATIVE

## 2019-02-13 LAB — CBC
HCT: 33.2 % — ABNORMAL LOW (ref 36.0–46.0)
Hemoglobin: 10 g/dL — ABNORMAL LOW (ref 12.0–15.0)
MCH: 27.8 pg (ref 26.0–34.0)
MCHC: 30.1 g/dL (ref 30.0–36.0)
MCV: 92.2 fL (ref 80.0–100.0)
Platelets: 188 10*3/uL (ref 150–400)
RBC: 3.6 MIL/uL — ABNORMAL LOW (ref 3.87–5.11)
RDW: 14.4 % (ref 11.5–15.5)
WBC: 7 10*3/uL (ref 4.0–10.5)
nRBC: 0 % (ref 0.0–0.2)

## 2019-02-13 LAB — GLUCOSE, CAPILLARY
Glucose-Capillary: 155 mg/dL — ABNORMAL HIGH (ref 70–99)
Glucose-Capillary: 172 mg/dL — ABNORMAL HIGH (ref 70–99)
Glucose-Capillary: 196 mg/dL — ABNORMAL HIGH (ref 70–99)

## 2019-02-13 LAB — COMPREHENSIVE METABOLIC PANEL
ALT: 17 U/L (ref 0–44)
AST: 17 U/L (ref 15–41)
Albumin: 3.7 g/dL (ref 3.5–5.0)
Alkaline Phosphatase: 119 U/L (ref 38–126)
Anion gap: 15 (ref 5–15)
BUN: 56 mg/dL — ABNORMAL HIGH (ref 8–23)
CO2: 23 mmol/L (ref 22–32)
Calcium: 8.7 mg/dL — ABNORMAL LOW (ref 8.9–10.3)
Chloride: 100 mmol/L (ref 98–111)
Creatinine, Ser: 10 mg/dL — ABNORMAL HIGH (ref 0.44–1.00)
GFR calc Af Amer: 4 mL/min — ABNORMAL LOW (ref >60)
GFR calc non Af Amer: 4 mL/min — ABNORMAL LOW (ref >60)
Glucose, Bld: 240 mg/dL — ABNORMAL HIGH (ref 70–99)
Potassium: 5.5 mmol/L — ABNORMAL HIGH (ref 3.5–5.1)
Sodium: 138 mmol/L (ref 135–145)
Total Bilirubin: 0.6 mg/dL (ref 0.3–1.2)
Total Protein: 7.3 g/dL (ref 6.5–8.1)

## 2019-02-13 LAB — HEMOGLOBIN A1C
Hgb A1c MFr Bld: 8.2 % — ABNORMAL HIGH (ref 4.8–5.6)
Mean Plasma Glucose: 188.64 mg/dL

## 2019-02-13 LAB — TYPE AND SCREEN
ABO/RH(D): A POS
Antibody Screen: NEGATIVE

## 2019-02-13 LAB — SARS CORONAVIRUS 2 BY RT PCR (HOSPITAL ORDER, PERFORMED IN ~~LOC~~ HOSPITAL LAB): SARS Coronavirus 2: NEGATIVE

## 2019-02-13 MED ORDER — AMLODIPINE BESYLATE 5 MG PO TABS
5.0000 mg | ORAL_TABLET | Freq: Two times a day (BID) | ORAL | Status: DC
  Administered 2019-02-13 – 2019-02-16 (×5): 5 mg via ORAL
  Filled 2019-02-13 (×5): qty 1

## 2019-02-13 MED ORDER — INSULIN ASPART 100 UNIT/ML ~~LOC~~ SOLN: 0.0000 [IU] | Freq: Every day | SUBCUTANEOUS | Status: DC

## 2019-02-13 MED ORDER — LABETALOL HCL 5 MG/ML IV SOLN: 10.0000 mg | INTRAVENOUS | Status: DC | PRN

## 2019-02-13 MED ORDER — HYDRALAZINE HCL 20 MG/ML IJ SOLN
10.0000 mg | Freq: Four times a day (QID) | INTRAMUSCULAR | Status: DC | PRN
  Administered 2019-02-13 (×2): 10 mg via INTRAVENOUS
  Filled 2019-02-13 (×2): qty 1

## 2019-02-13 MED ORDER — METOPROLOL SUCCINATE ER 100 MG PO TB24
100.0000 mg | ORAL_TABLET | Freq: Every day | ORAL | Status: DC
  Administered 2019-02-14 – 2019-02-16 (×3): 100 mg via ORAL
  Filled 2019-02-13 (×3): qty 1

## 2019-02-13 MED ORDER — INSULIN ASPART 100 UNIT/ML ~~LOC~~ SOLN
0.0000 [IU] | Freq: Three times a day (TID) | SUBCUTANEOUS | Status: DC
  Administered 2019-02-14: 5 [IU] via SUBCUTANEOUS
  Administered 2019-02-14: 3 [IU] via SUBCUTANEOUS
  Filled 2019-02-13 (×2): qty 1

## 2019-02-13 MED ORDER — LEVETIRACETAM 250 MG PO TABS
250.0000 mg | ORAL_TABLET | ORAL | Status: DC
  Administered 2019-02-13 – 2019-02-15 (×2): 250 mg via ORAL
  Filled 2019-02-13 (×2): qty 1

## 2019-02-13 MED ORDER — LEVETIRACETAM 500 MG PO TABS
1000.0000 mg | ORAL_TABLET | Freq: Every day | ORAL | Status: DC
  Administered 2019-02-14 – 2019-02-16 (×3): 1000 mg via ORAL
  Filled 2019-02-13 (×3): qty 2

## 2019-02-13 MED ORDER — ACETAMINOPHEN 325 MG PO TABS: 650.0000 mg | ORAL_TABLET | ORAL | Status: DC | PRN

## 2019-02-13 MED ORDER — GABAPENTIN 100 MG PO CAPS
100.0000 mg | ORAL_CAPSULE | Freq: Three times a day (TID) | ORAL | Status: DC
  Administered 2019-02-13 – 2019-02-16 (×7): 100 mg via ORAL
  Filled 2019-02-13 (×7): qty 1

## 2019-02-13 MED ORDER — SODIUM CHLORIDE 0.9 % IV SOLN
80.0000 mg | Freq: Once | INTRAVENOUS | Status: AC
  Administered 2019-02-13: 80 mg via INTRAVENOUS
  Filled 2019-02-13: qty 80

## 2019-02-13 MED ORDER — PEG 3350-KCL-NA BICARB-NACL 420 G PO SOLR
4000.0000 mL | Freq: Once | ORAL | Status: AC
  Administered 2019-02-14: 4000 mL via ORAL
  Filled 2019-02-13: qty 4000

## 2019-02-13 MED ORDER — SODIUM CHLORIDE 0.9 % IV SOLN
8.0000 mg/h | INTRAVENOUS | Status: DC
  Administered 2019-02-13 – 2019-02-14 (×4): 8 mg/h via INTRAVENOUS
  Filled 2019-02-13 (×4): qty 80

## 2019-02-13 MED ORDER — INSULIN ASPART 100 UNIT/ML ~~LOC~~ SOLN
0.0000 [IU] | SUBCUTANEOUS | Status: DC
  Administered 2019-02-13: 2 [IU] via SUBCUTANEOUS
  Filled 2019-02-13: qty 1

## 2019-02-13 MED ORDER — ATORVASTATIN CALCIUM 20 MG PO TABS
80.0000 mg | ORAL_TABLET | Freq: Every evening | ORAL | Status: DC
  Administered 2019-02-13 – 2019-02-15 (×3): 80 mg via ORAL
  Filled 2019-02-13 (×3): qty 4

## 2019-02-13 NOTE — ED Notes (Signed)
Sent rainbow with T&S to lab. ?

## 2019-02-13 NOTE — Consult Note (Signed)
GI Inpatient Consult Note  Reason for Consult: Hematochezia    Attending Requesting Consult: Dr. Merlyn Lot, ED  History of Present Illness: Cynthia Dean is a 68 y.o. female seen for evaluation of hematochezia at the request of ED Physician - Dr. Denyce Robert. Pt has a PMH of ESRD on hemodialysis MWF, COPD, CAD, T2DM, HLD, HTN, Hx of recurrent CVA on aspirin and Plavix presented to the [facility] today for complains of hematochezia. Pt reports she has noticed bright red blood on the tissue paper when she wipes and in the toilet bowl worsening over the past 3 days. She reports she first noticed the blood about one month ago and has been constant over this time. She has seen blood with every single BM over this time. She denies any associated weakness, fatigue, or shortness of breath away from baseline. She reports previous colonoscopy >7 years ago which was reportedly negative. She denies any known family hx of colon cancer, adenomatous polyps, or IBD. No prior history of GI bleeding. She denies any abdominal pain, nausea, vomiting, dysphagia, odynophagia, early satiety, or epigastric pain. She denies any significant pyrosis or reflux symptoms. She denies frequent NSAID use outside of her daily baby aspirin use. No known sick contacts or recent travel. CT abd/pelvis 06/2017 showed 40mm submucosal lipoma within the cecum. Labs upon arrival to the ED were significant for potassium 5.5, Creatinine 10.00, BUN 56, hemoglobin 10.0, and WBC 7.0. Hemoglobin over the past month has been in the low 9s. Hemoglobin 8 months ago was 12.0.   Last Colonoscopy: Colonoscopy 01/2008 - no report available  Last Endoscopy: 2015 - Hx of PUD   Past Medical History:  Past Medical History:  Diagnosis Date  . COPD (chronic obstructive pulmonary disease) ([place])   . Coronary artery disease   . Diabetes mellitus without complication ([place])   . ESRD on dialysis ([place])   . Heart murmur   .  Hyperlipidemia   . Hypertension   . Stroke [facility])     Problem List: Patient Active Problem List   Diagnosis Date Noted  . Acute respiratory failure with hypoxia ([place]) 12/31/2018  . Nausea & vomiting 05/30/2018  . Nausea and vomiting 05/29/2018  . Partial seizure ([place]) 03/14/2018  . Stroke ([place]) 03/07/2018  . Slurred speech 11/14/2017  . Diabetes ([place]) 11/12/2017  . CAD (coronary artery disease) 11/12/2017  . COPD (chronic obstructive pulmonary disease) ([place]) 11/12/2017  . ESRD on dialysis ([place]) 11/12/2017  . Hematemesis 06/27/2017  . CVA (cerebral vascular accident) ([place]) 04/14/2016  . Essential hypertension, benign 11/27/2015  . Hyperlipidemia 11/27/2015  . Obesity 11/27/2015  . Prolonged Q-T interval on ECG 11/26/2015  . Intractable nausea and vomiting 08/08/2015    Past Surgical History: Past Surgical History:  Procedure Laterality Date  . A/V FISTULAGRAM Left 12/20/2016   Procedure: A/V Fistulagram;  Surgeon: Katha Cabal, MD;  Location: [place] [facility];  Service: Cardiovascular;  Laterality: Left;  . A/V SHUNT INTERVENTION N/A 12/20/2016   Procedure: A/V Shunt Intervention;  Surgeon: Katha Cabal, MD;  Location: [place] [facility];  Service: Cardiovascular;  Laterality: N/A;  . A/V SHUNTOGRAM Left 09/11/2017   Procedure: A/V SHUNTOGRAM;  Surgeon: Algernon Huxley, MD;  Location: [place] [facility];  Service: Cardiovascular;  Laterality: Left;  . BREAST BIOPSY Bilateral 07/19/00   neg  . LOOP RECORDER INSERTION N/A 11/16/2017   Procedure: LOOP RECORDER INSERTION;  Surgeon: Deboraha Sprang, MD;  Location: [facility]  [facility];  Service: Cardiovascular;  Laterality: N/A;  . PERIPHERAL VASCULAR CATHETERIZATION Left 02/02/2015   Procedure: A/V Shuntogram/Fistulagram;  Surgeon: Algernon Huxley, MD;  Location: [place] [facility];  Service: Cardiovascular;  Laterality: Left;  . PERIPHERAL VASCULAR CATHETERIZATION Left 02/02/2015   Procedure: A/V Shunt Intervention;   Surgeon: Algernon Huxley, MD;  Location: [place] [facility];  Service: Cardiovascular;  Laterality: Left;  . PERIPHERAL VASCULAR CATHETERIZATION Left 03/09/2015   Procedure: A/V Shuntogram/Fistulagram;  Surgeon: Algernon Huxley, MD;  Location: [place] [facility];  Service: Cardiovascular;  Laterality: Left;  . PERIPHERAL VASCULAR CATHETERIZATION N/A 03/09/2015   Procedure: A/V Shunt Intervention;  Surgeon: Algernon Huxley, MD;  Location: [place] [facility];  Service: Cardiovascular;  Laterality: N/A;  . PERIPHERAL VASCULAR CATHETERIZATION Left 04/17/2015   Procedure: Upper Extremity Angiography;  Surgeon: Algernon Huxley, MD;  Location: [place] [facility];  Service: Cardiovascular;  Laterality: Left;  . PERIPHERAL VASCULAR CATHETERIZATION  04/17/2015   Procedure: Upper Extremity Intervention;  Surgeon: Algernon Huxley, MD;  Location: [place] [facility];  Service: Cardiovascular;;  . PERIPHERAL VASCULAR CATHETERIZATION N/A 08/10/2015   Procedure: A/V Shuntogram/Fistulagram;  Surgeon: Algernon Huxley, MD;  Location: [place] [facility];  Service: Cardiovascular;  Laterality: N/A;  . PERIPHERAL VASCULAR CATHETERIZATION N/A 08/10/2015   Procedure: A/V Shunt Intervention;  Surgeon: Algernon Huxley, MD;  Location: [place] [facility];  Service: Cardiovascular;  Laterality: N/A;  . PERIPHERAL VASCULAR CATHETERIZATION Left 04/11/2016   Procedure: A/V Shuntogram/Fistulagram;  Surgeon: Algernon Huxley, MD;  Location: [place] [facility];  Service: Cardiovascular;  Laterality: Left;  . TEE WITHOUT CARDIOVERSION N/A 11/16/2017   Procedure: TRANSESOPHAGEAL ECHOCARDIOGRAM (TEE);  Surgeon: Minna Merritts, MD;  Location: [facility];  Service: Cardiovascular;  Laterality: N/A;    Allergies: Allergies  Allergen Reactions  . Tape Itching    Skin Dermatitis/itching (tape adhesive) Skin Dermatitis/itching (tape adhesive)  . Tapentadol Itching    Skin Dermatitis/itching (tape adhesive) Skin Dermatitis/itching (tape adhesive)     Home  Medications: (Not in a hospital admission)  Home medication reconciliation was completed with the patient.   Scheduled Inpatient Medications:    Continuous Inpatient Infusions:   . pantoprazole (PROTONIX) IVPB    . pantoprozole (PROTONIX) infusion      PRN Inpatient Medications:    Family History: family history includes Breast cancer (age of onset: 29) in her cousin; Breast cancer (age of onset: 58) in her father; Heart Problems in her sister; Heart attack in her mother; Stroke in her mother.  The patient's family history is negative for inflammatory bowel disorders, GI malignancy, or solid organ transplantation.  Social History:   reports that she has quit smoking. Her smoking use included cigarettes. She smoked 0.00 packs per day. She has never used smokeless tobacco. She reports that she does not drink alcohol or use drugs. The patient denies ETOH, tobacco, or drug use.   Review of Systems: Constitutional: Weight is stable.  Eyes: No changes in vision. ENT: No oral lesions, sore throat.  GI: see HPI.  Heme/Lymph: No easy bruising.  CV: No chest pain.  GU: No hematuria.  Integumentary: No rashes.  Neuro: No headaches.  Psych: No depression/anxiety.  Endocrine: No heat/cold intolerance.  Allergic/Immunologic: No urticaria.  Resp: No cough, SOB.  Musculoskeletal: No joint swelling.    Physical Examination: BP (!) 175/64 (BP Location: Right Arm)   Pulse 65   Temp 98.5 F (36.9 C) (Oral)  Resp 19   Ht 5\' 9"  (1.753 m)   Wt 122.5 kg   SpO2 94%   BMI 39.87 kg/m  Gen: NAD, alert and oriented x 4 HEENT: PEERLA, EOMI, Neck: supple, no JVD or thyromegaly Chest: CTA bilaterally, no wheezes, crackles, or other adventitious sounds CV: RRR, no m/g/c/r Abd: soft, ND, obese abdomen, hypoactive BS in all four quadrants; mild TTP in RLQ and LLQ, no HSM, guarding, ridigity, or rebound tenderness Ext: no edema, well perfused with 2+ pulses, Skin: no rash or lesions  noted Lymph: no LAD  Data: Lab Results  Component Value Date   WBC 7.0 02/13/2019   HGB 10.0 (L) 02/13/2019   HCT 33.2 (L) 02/13/2019   MCV 92.2 02/13/2019   PLT 188 02/13/2019   Recent Labs  Lab 02/13/19 1014  HGB 10.0*   Lab Results  Component Value Date   NA 138 02/13/2019   K 5.5 (H) 02/13/2019   CL 100 02/13/2019   CO2 23 02/13/2019   BUN 56 (H) 02/13/2019   CREATININE 10.00 (H) 02/13/2019   Lab Results  Component Value Date   ALT 17 02/13/2019   AST 17 02/13/2019   ALKPHOS 119 02/13/2019   BILITOT 0.6 02/13/2019   No results for input(s): APTT, INR, PTT in the last 168 hours. Assessment/Plan:  68 y/o AA female with a PMH of ESRD on hemodialysis MWF, CAD, Hx of recurrent CVA, chronic antiplatelet therapy with Plavix and ASA, HTN, HLD, T2DM presented to the ED for hematochezia x 1 month  1. Hematochezia 2. Acute blood loss anemia 3. Multiple comorbidities - ESRD on HD, CAD, chronic antiplatelet therapy, COPD  - Pt presented to the ED for hematochezia x 1 month with worsening over the past 3 days. She has had roughly 3 gram drop in Hgb over the past 8 months. Hgb over the past month has remained stable. No evidence of active GI bleeding at this time.  - Differential includes anal outlet etiology, diverticular bleed, polyp, malignancy, AVMs, upper GI source less likely - Agree with PPI - Continue to monitor H&H. Transfuse for Hgb <7.0. - Hold all antiplatelet therapy at this time - Recommend luminal evaluation when clinically feasible. Would like for her to be off Plavix for at least 48 hours before any invasive evaluation. Will need to coordinate patient to potentially have hemodialysis in the morning and colonoscopy potentially Friday afternoon. - I reviewed the risks (including bleeding, perforation, infection, anesthesia complications, cardiac/respiratory complications), benefits and alternatives of colonoscopy. Patient consents to proceed.  - Potentially  colonoscopy on Friday - We will continue to follow    Thank you for the consult. Please call with questions or concerns.  Reeves Forth [facility] [phone] (Cell)

## 2019-02-13 NOTE — H&P (Addendum)
agree with detailed note below. Patient presentation and plan discussed on rounds with APP.    [facility] [place] NAME: Cynthia Dean    MR#:  [id number]  DATE OF BIRTH:  02-03-1951  DATE OF ADMISSION:  02/13/2019  PRIMARY CARE PHYSICIAN: Lowella Bandy, MD   REQUESTING/REFERRING PHYSICIAN: Merlyn Lot, MD  CHIEF COMPLAINT:   Chief Complaint  Patient presents with  . GI Bleeding    HISTORY OF PRESENT ILLNESS:  68 y.o. 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  Patient reports that she noticed bright red blood on the tissue paper when she wipes in the toilet bowl worsening over the past 3 to 4 days without associated symptoms of diarrhea, abdominal pain, nausea or vomiting.  She states that she first noticed blood about a month ago and is been slightly progressive.  On arrival to the ED, he was afebrile with blood pressure 224/114 mm Hg and pulse rate 81 beats/min. There were no focal neurological deficits; she was alert and oriented x4.  Initial labs revealed potassium 5.5, creatinine 10, BUN 56, hemoglobin 10, WBC 7, and blood glucose 240.  Case was discussed with on-call gastroenterologist who will see patient during this admission for possible EGD/colonoscopy.  Patient will be admitted under hospitalist service for further management.  PAST MEDICAL HISTORY:   Past Medical History:  Diagnosis Date  . COPD (chronic obstructive pulmonary disease) ([place])   . Coronary artery disease   . Diabetes mellitus without complication ([place])   . ESRD on dialysis ([place])   . Heart murmur   . Hyperlipidemia   . Hypertension   . Stroke [facility])     PAST SURGICAL  HISTORY:   Past Surgical History:  Procedure Laterality Date  . A/V FISTULAGRAM Left 12/20/2016   Procedure: A/V Fistulagram;  Surgeon: Katha Cabal, MD;  Location: [place] CV LAB;  Service: Cardiovascular;  Laterality: Left;  . A/V SHUNT INTERVENTION N/A 12/20/2016   Procedure: A/V Shunt Intervention;  Surgeon: Katha Cabal, MD;  Location: [place] CV LAB;  Service: Cardiovascular;  Laterality: N/A;  . A/V SHUNTOGRAM Left 09/11/2017   Procedure: A/V SHUNTOGRAM;  Surgeon: Algernon Huxley, MD;  Location: [place] CV LAB;  Service: Cardiovascular;  Laterality: Left;  . BREAST BIOPSY Bilateral 07/19/00   neg  . LOOP RECORDER INSERTION N/A 11/16/2017   Procedure: LOOP RECORDER INSERTION;  Surgeon: Deboraha Sprang, MD;  Location: [place] CV LAB;  Service: Cardiovascular;  Laterality: N/A;  . PERIPHERAL VASCULAR CATHETERIZATION Left 02/02/2015   Procedure: A/V Shuntogram/Fistulagram;  Surgeon: Algernon Huxley, MD;  Location: [place] CV LAB;  Service: Cardiovascular;  Laterality: Left;  . PERIPHERAL VASCULAR CATHETERIZATION Left 02/02/2015   Procedure: A/V Shunt Intervention;  Surgeon: Algernon Huxley, MD;  Location: [place] CV LAB;  Service: Cardiovascular;  Laterality: Left;  . PERIPHERAL VASCULAR CATHETERIZATION Left 03/09/2015   Procedure: A/V Shuntogram/Fistulagram;  Surgeon: Algernon Huxley, MD;  Location: [place] CV LAB;  Service: Cardiovascular;  Laterality: Left;  . PERIPHERAL VASCULAR CATHETERIZATION N/A 03/09/2015   Procedure: A/V Shunt Intervention;  Surgeon: Algernon Huxley, MD;  Location: [place] CV LAB;  Service: Cardiovascular;  Laterality: N/A;  . PERIPHERAL VASCULAR CATHETERIZATION  Left 04/17/2015   Procedure: Upper Extremity Angiography;  Surgeon: Algernon Huxley, MD;  Location: [place] CV LAB;  Service: Cardiovascular;  Laterality: Left;  . PERIPHERAL VASCULAR CATHETERIZATION  04/17/2015   Procedure: Upper Extremity Intervention;  Surgeon: Algernon Huxley, MD;  Location: [place] CV LAB;  Service: Cardiovascular;;  . PERIPHERAL VASCULAR CATHETERIZATION N/A 08/10/2015   Procedure: A/V Shuntogram/Fistulagram;  Surgeon: Algernon Huxley, MD;  Location: [place] CV LAB;  Service: Cardiovascular;  Laterality: N/A;  . PERIPHERAL VASCULAR CATHETERIZATION N/A 08/10/2015   Procedure: A/V Shunt Intervention;  Surgeon: Algernon Huxley, MD;  Location: [place] CV LAB;  Service: Cardiovascular;  Laterality: N/A;  . PERIPHERAL VASCULAR CATHETERIZATION Left 04/11/2016   Procedure: A/V Shuntogram/Fistulagram;  Surgeon: Algernon Huxley, MD;  Location: [place] CV LAB;  Service: Cardiovascular;  Laterality: Left;  . TEE WITHOUT CARDIOVERSION N/A 11/16/2017   Procedure: TRANSESOPHAGEAL ECHOCARDIOGRAM (TEE);  Surgeon: Minna Merritts, MD;  Location: [facility] ORS;  Service: Cardiovascular;  Laterality: N/A;    SOCIAL HISTORY:   Social History   Tobacco Use  . Smoking status: Former Smoker    Packs/day: 0.00    Types: Cigarettes  . Smokeless tobacco: Never Used  Substance Use Topics  . Alcohol use: No    FAMILY HISTORY:   Family History  Problem Relation Age of Onset  . Breast cancer Father 34  . Stroke Mother   . Heart attack Mother   . Heart Problems Sister   . Breast cancer Cousin 31       1 st cousin. Maternal     DRUG ALLERGIES:   Allergies  Allergen Reactions  . Hydrocodone     Intolerant more than allergic  . Tape Itching    Skin Dermatitis/itching (tape adhesive) Skin Dermatitis/itching (tape adhesive)  . Tapentadol Itching    Skin Dermatitis/itching (tape adhesive) Skin Dermatitis/itching (tape adhesive)     REVIEW OF SYSTEMS:   ROS  MEDICATIONS AT HOME:   Prior to Admission medications   Medication Sig Start Date End Date Taking? Authorizing Provider  amLODipine (NORVASC) 5 MG tablet Take 5 mg by mouth 2 (two) times daily. 08/07/18  Yes [provider]  aspirin EC 325 MG tablet Take 325 mg by mouth daily.  05/29/18  Yes [provider]  atorvastatin (LIPITOR) 80 MG tablet Take 80 mg by mouth every evening.    Yes [provider]  calcium acetate (PHOSLO) 667 MG capsule Take 2,001 mg by mouth 3 (three) times daily. 01/21/19  Yes [provider]  clopidogrel (PLAVIX) 75 MG tablet TAKE ONE TABLET BY MOUTH ONCE DAILY Patient taking differently: Take 75 mg by mouth daily.  06/14/16  Yes Stegmayer, Joelene Millin A, PA-C  gabapentin (NEURONTIN) 100 MG capsule Take 100 mg by mouth 3 (three) times daily.   Yes [provider]  insulin NPH-regular Human (NOVOLIN 70/30) (70-30) 100 UNIT/ML injection Inject 25 Units into the skin 2 (two) times daily with a meal. Except dialysis days. Do not take on Tuesdays, Thursdays, and Saturdays. 11/17/17  Yes Gouru, Illene Silver, MD  levETIRAcetam (KEPPRA) 1000 MG tablet Take 1 tablet (1,000 mg total) by mouth daily. 03/15/18  Yes Vaughan Basta, MD  losartan (COZAAR) 100 MG tablet Take 100 mg by mouth at bedtime. Takes in the morning 09/12/17  Yes [provider]  metoprolol succinate (TOPROL-XL) 100 MG 24 hr tablet Take 100 mg by mouth daily. Take with or immediately following a meal.    Yes  [provider]  omeprazole (PRILOSEC) 20 MG capsule Take 20 mg by mouth 2 (two) times daily.    Yes [provider]  torsemide (DEMADEX) 100 MG tablet Take 100 mg by mouth daily. 10/10/17  Yes [provider]  acetaminophen (TYLENOL) 325 MG tablet Take 2 tablets (650 mg total) by mouth every 4 (four) hours as needed for mild pain (or temp > 37.5 C (99.5 F)). 11/17/17   Nicholes Mango, MD  levETIRAcetam (KEPPRA) 250 MG tablet Take 1 tablet (250 mg total) by mouth 3 (three) times a week. Take after dialysis on Monday, wed and Friday in addition to regular dose. 03/14/18   Vaughan Basta, MD  lidocaine-prilocaine (EMLA) cream Apply 1 application topically as needed (for port access).     [provider]       VITAL SIGNS:  Blood pressure (!) 186/78, pulse 64, temperature 97.6 F (36.4 C), temperature source Oral, resp. rate 20, height 5\' 9"  (1.753 m), weight 122.5 kg, SpO2 97 %.  PHYSICAL EXAMINATION:   Physical Exam   GENERAL:  68 y.o.-year-old patient lying in the bed with no acute distress.  EYES: Pupils equal, round, reactive to light and accommodation. No scleral icterus. Extraocular muscles intact.  HEENT: Head atraumatic, normocephalic. Oropharynx and nasopharynx clear.  NECK:  Supple, no jugular venous distention. No thyroid enlargement, no tenderness.  LUNGS: Decreased breath sounds bilaterally, mild wheezing with rales and rhonchi. No crepitation. No use of accessory muscles of respiration.  CARDIOVASCULAR: S1, S2 normal. murmurs, No rubs, or gallops.  ABDOMEN: Soft, nontender, nondistended. Bowel sounds present. No organomegaly or mass.  EXTREMITIES: No pedal edema, cyanosis, or clubbing. No rash or lesions. + pedal pulses MUSCULOSKELETAL: Normal bulk, and power was 5+ grip and elbow, knee, and ankle flexion and extension bilaterally.  NEUROLOGIC:Alert and oriented x 3. CN 2-12 intact.Milddysarthria, mild expressive aphasia Sensation to light touch and cold stimuli intact bilaterally. Finger to nose nl. Babinski is downgoing. DTR's (biceps, patellar, and achilles) 2+ and symmetric throughout. Gait not tested due to safety concern. PSYCHIATRIC: The patient is alert and oriented x 3.  SKIN: No obvious rash, lesion, or ulcer.   DATA REVIEWED:  LABORATORY PANEL:   CBC Recent Labs  Lab 02/13/19 1014  WBC 7.0  HGB 10.0*  HCT 33.2*  PLT 188   ------------------------------------------------------------------------------------------------------------------  Chemistries  Recent Labs  Lab 02/13/19 1014  NA 138  K 5.5*  CL 100  CO2 23  GLUCOSE 240*  BUN 56*  CREATININE 10.00*  CALCIUM 8.7*  AST 17  ALT 17  ALKPHOS 119  BILITOT 0.6    ------------------------------------------------------------------------------------------------------------------  Cardiac Enzymes No results for input(s): TROPONINI in the last 168 hours. ------------------------------------------------------------------------------------------------------------------  RADIOLOGY:  No results found.  EKG:  EKG: there are no previous tracings available for comparison.  IMPRESSION AND PLAN:   67 y.o. 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  1.  GI Bleed -patient presenting with hematochezia, Melena -Admit to MedSurg unit - IVF resuscitation to maintain MAP>65 - H&H monitoring q6h -Transfuse PRN Hgb<8 - Pantoprazole 80mg  IV x1 then gtt 8mg /hr.  Plan to switch to pantoprazole 40 mg IV twice daily - NPO for pending endoscopy - GI Consult for EGD/Colonoscopy possible on Friday per GI as she  needs to be off Plavix for 3 days. - Hold NSAIDs, steroids, ASA  2.  Acute on chronic kidney disease - ESRD on dialysis- M-W-F.  Missed dialysis today - BUN/Cr elevated from baseline - Nephrology consult for possible HD, message sent via haiku to Dr. Holley Raring  3. Chronic diastolic congestive Heart Failure: stable Last Echo 12/2018 withLV EF 55-60%  - continueMetoprolol  - Hold Torsemide.  - Low salt diet - Check daily weight - Strict I&Os   4. Hyperkalemia - No EKG changes, pending HD - Recheck now - Recheck in am + mag  5.Chronic COPD- patient with hx of COPD uses O2 as needed. No evidence of exacerbation - Supplemental O2, goal sat 88-92% - DuoNebs every 6 hours  6. Hx of CVA with residual aphasia - Continue dual therapy Aspirin 81 mg/day and Plavix 75 mg /day    7.Coronary artery disease  - ASA 81mg  PO daily - Clopidogrel 75mg  PO daily  - HTN, HLD, DM control as below - Follows with cardiology Dr. Rockey Situ on outpatient basis   8.HLD + Goal LDL<70 - Atorvastatin 80mg  PO qhs  9.HTN- stable + Goal BP <130/80 - Beta-blocker: Metoprolol - Continue amlodipine - ACE-Inhibitor: Hold Losartan  10.DM -Insulindependent at home - Start SSI - Diabetes educator to follow  11.  History of seizures -continue Keppra  All the records are reviewed and case discussed with ED provider. Management plans discussed with the patient, family and they are in agreement.  CODE STATUS: FULL  TOTAL TIME TAKING CARE OF THIS PATIENT: 50 minutes.    on 02/13/2019 at 8:08 PM  Rufina Falco, DNP, FNP-BC [facility] Nurse Practitioner Between 7am to 6pm - Pager [phone]  After 6pm go to [email or web address] - password [email or web address] [facility]  Office  [phone]  CC: Primary care physician; Lowella Bandy, MD

## 2019-02-13 NOTE — ED Notes (Signed)
ED TO INPATIENT HANDOFF REPORT  ED Nurse Name and Phone #: Anderson [place] [phone]  S Name/Age/Gender Cynthia Dean 68 y.o. female Room/Bed: ED10A/ED10A  Code Status   Code Status: Full Code  Home/SNF/Other Home Patient oriented to: self, place, time and situation Is this baseline? Yes   Triage Complete: Triage complete  Chief Complaint GI Bleed  Triage Note Pt sent from [facility] with c/o bright red blood in stools for the past week, without any visible cause like hemorrhoids . Pt denies any pain,   Allergies Allergies  Allergen Reactions  . Hydrocodone     Intolerant more than allergic  . Tape Itching    Skin Dermatitis/itching (tape adhesive) Skin Dermatitis/itching (tape adhesive)  . Tapentadol Itching    Skin Dermatitis/itching (tape adhesive) Skin Dermatitis/itching (tape adhesive)     Level of Care/Admitting Diagnosis ED Disposition    ED Disposition Condition [facility] Area: [place] [[id number]]  Level of Care: Med-Surg [16]  Covid Evaluation: Person Under Investigation (PUI)  Diagnosis: Rectal bleed [209470]  Admitting Physician: Lang Snow [[id number]]  Attending Physician: Rufina Falco ACHIENG [[id number]]  Estimated length of stay: past midnight tomorrow  Certification:: I certify this patient will need inpatient services for at least 2 midnights  PT Class (Do Not Modify): Inpatient [101]  PT Acc Code (Do Not Modify): Private [1]       B Medical/Surgery History Past Medical History:  Diagnosis Date  . COPD (chronic obstructive pulmonary disease) ([place])   . Coronary artery disease   . Diabetes mellitus without complication ([place])   . ESRD on dialysis ([place])   . Heart murmur   . Hyperlipidemia   . Hypertension   . Stroke [facility])    Past Surgical History:  Procedure Laterality Date  . A/V FISTULAGRAM Left 12/20/2016   Procedure: A/V Fistulagram;  Surgeon: Katha Cabal, MD;   Location: [place] CV LAB;  Service: Cardiovascular;  Laterality: Left;  . A/V SHUNT INTERVENTION N/A 12/20/2016   Procedure: A/V Shunt Intervention;  Surgeon: Katha Cabal, MD;  Location: [place] CV LAB;  Service: Cardiovascular;  Laterality: N/A;  . A/V SHUNTOGRAM Left 09/11/2017   Procedure: A/V SHUNTOGRAM;  Surgeon: Algernon Huxley, MD;  Location: [place] CV LAB;  Service: Cardiovascular;  Laterality: Left;  . BREAST BIOPSY Bilateral 07/19/00   neg  . LOOP RECORDER INSERTION N/A 11/16/2017   Procedure: LOOP RECORDER INSERTION;  Surgeon: Deboraha Sprang, MD;  Location: [place] CV LAB;  Service: Cardiovascular;  Laterality: N/A;  . PERIPHERAL VASCULAR CATHETERIZATION Left 02/02/2015   Procedure: A/V Shuntogram/Fistulagram;  Surgeon: Algernon Huxley, MD;  Location: [place] CV LAB;  Service: Cardiovascular;  Laterality: Left;  . PERIPHERAL VASCULAR CATHETERIZATION Left 02/02/2015   Procedure: A/V Shunt Intervention;  Surgeon: Algernon Huxley, MD;  Location: [place] CV LAB;  Service: Cardiovascular;  Laterality: Left;  . PERIPHERAL VASCULAR CATHETERIZATION Left 03/09/2015   Procedure: A/V Shuntogram/Fistulagram;  Surgeon: Algernon Huxley, MD;  Location: [place] CV LAB;  Service: Cardiovascular;  Laterality: Left;  . PERIPHERAL VASCULAR CATHETERIZATION N/A 03/09/2015   Procedure: A/V Shunt Intervention;  Surgeon: Algernon Huxley, MD;  Location: [place] CV LAB;  Service: Cardiovascular;  Laterality: N/A;  . PERIPHERAL VASCULAR CATHETERIZATION Left 04/17/2015   Procedure: Upper Extremity Angiography;  Surgeon: Algernon Huxley, MD;  Location: [place] CV LAB;  Service: Cardiovascular;  Laterality: Left;  . PERIPHERAL VASCULAR CATHETERIZATION  04/17/2015   Procedure: Upper Extremity Intervention;  Surgeon: Algernon Huxley, MD;  Location: [place] CV LAB;  Service: Cardiovascular;;  . PERIPHERAL VASCULAR CATHETERIZATION N/A 08/10/2015   Procedure: A/V Shuntogram/Fistulagram;   Surgeon: Algernon Huxley, MD;  Location: [place] CV LAB;  Service: Cardiovascular;  Laterality: N/A;  . PERIPHERAL VASCULAR CATHETERIZATION N/A 08/10/2015   Procedure: A/V Shunt Intervention;  Surgeon: Algernon Huxley, MD;  Location: [place] CV LAB;  Service: Cardiovascular;  Laterality: N/A;  . PERIPHERAL VASCULAR CATHETERIZATION Left 04/11/2016   Procedure: A/V Shuntogram/Fistulagram;  Surgeon: Algernon Huxley, MD;  Location: [place] CV LAB;  Service: Cardiovascular;  Laterality: Left;  . TEE WITHOUT CARDIOVERSION N/A 11/16/2017   Procedure: TRANSESOPHAGEAL ECHOCARDIOGRAM (TEE);  Surgeon: Minna Merritts, MD;  Location: [facility] ORS;  Service: Cardiovascular;  Laterality: N/A;     A IV Location/Drains/Wounds Patient Lines/Drains/Airways Status   Active Line/Drains/Airways    Name:   Placement date:   Placement time:   Site:   Days:   Peripheral IV 02/13/19 Right Antecubital   02/13/19    1333    Antecubital   less than 1          Intake/Output Last 24 hours  Intake/Output Summary (Last 24 hours) at 02/13/2019 1512 Last data filed at 02/13/2019 1504 Gross per 24 hour  Intake 100 ml  Output -  Net 100 ml    Labs/Imaging Results for orders placed or performed during the hospital encounter of 02/13/19 (from the past 48 hour(s))  Comprehensive metabolic panel     Status: Abnormal   Collection Time: 02/13/19 10:14 AM  Result Value Ref Range   Sodium 138 135 - 145 mmol/L   Potassium 5.5 (H) 3.5 - 5.1 mmol/L   Chloride 100 98 - 111 mmol/L   CO2 23 22 - 32 mmol/L   Glucose, Bld 240 (H) 70 - 99 mg/dL   BUN 56 (H) 8 - 23 mg/dL   Creatinine, Ser 10.00 (H) 0.44 - 1.00 mg/dL   Calcium 8.7 (L) 8.9 - 10.3 mg/dL   Total Protein 7.3 6.5 - 8.1 g/dL   Albumin 3.7 3.5 - 5.0 g/dL   AST 17 15 - 41 U/L   ALT 17 0 - 44 U/L   Alkaline Phosphatase 119 38 - 126 U/L   Total Bilirubin 0.6 0.3 - 1.2 mg/dL   GFR calc non Af Amer 4 (L) >60 mL/min   GFR calc Af Amer 4 (L) >60 mL/min   Anion gap 15  5 - 15    Comment: Performed at [facility], [address] [place], [place] [address]  CBC     Status: Abnormal   Collection Time: 02/13/19 10:14 AM  Result Value Ref Range   WBC 7.0 4.0 - 10.5 K/uL   RBC 3.60 (L) 3.87 - 5.11 MIL/uL   Hemoglobin 10.0 (L) 12.0 - 15.0 g/dL   HCT 33.2 (L) 36.0 - 46.0 %   MCV 92.2 80.0 - 100.0 fL   MCH 27.8 26.0 - 34.0 pg   MCHC 30.1 30.0 - 36.0 g/dL   RDW 14.4 11.5 - 15.5 %   Platelets 188 150 - 400 K/uL   nRBC 0.0 0.0 - 0.2 %    Comment: Performed at [facility], [address] [place], [place] [address]  Type and screen [place]     Status: None   Collection Time: 02/13/19 10:14 AM  Result Value Ref Range   ABO/RH(D)  A POS    Antibody Screen NEG    Sample Expiration      02/16/2019,2359 Performed at [facility], [address] [place], [place] [address]   SARS Coronavirus 2 (CEPHEID- Performed in [facility] hospital lab), Hosp Order     Status: None   Collection Time: 02/13/19 12:41 PM   Specimen: Nasopharyngeal Swab  Result Value Ref Range   SARS Coronavirus 2 NEGATIVE NEGATIVE    Comment: (NOTE) If result is NEGATIVE SARS-CoV-2 target nucleic acids are NOT DETECTED. The SARS-CoV-2 RNA is generally detectable in upper and lower  respiratory specimens during the acute phase of infection. The lowest  concentration of SARS-CoV-2 viral copies this assay can detect is 250  copies / mL. A negative result does not preclude SARS-CoV-2 infection  and should not be used as the sole basis for treatment or other  patient management decisions.  A negative result may occur with  improper specimen collection / handling, submission of specimen other  than nasopharyngeal swab, presence of viral mutation(s) within the  areas targeted by this assay, and inadequate number of viral copies  (<250 copies / mL). A negative result must be combined with clinical  observations, patient history, and  epidemiological information. If result is POSITIVE SARS-CoV-2 target nucleic acids are DETECTED. The SARS-CoV-2 RNA is generally detectable in upper and lower  respiratory specimens dur ing the acute phase of infection.  Positive  results are indicative of active infection with SARS-CoV-2.  Clinical  correlation with patient history and other diagnostic information is  necessary to determine patient infection status.  Positive results do  not rule out bacterial infection or co-infection with other viruses. If result is PRESUMPTIVE POSTIVE SARS-CoV-2 nucleic acids MAY BE PRESENT.   A presumptive positive result was obtained on the submitted specimen  and confirmed on repeat testing.  While 2019 novel coronavirus  (SARS-CoV-2) nucleic acids may be present in the submitted sample  additional confirmatory testing may be necessary for epidemiological  and / or clinical management purposes  to differentiate between  SARS-CoV-2 and other Sarbecovirus currently known to infect humans.  If clinically indicated additional testing with an alternate test  methodology [phone]) is advised. The SARS-CoV-2 RNA is generally  detectable in upper and lower respiratory sp ecimens during the acute  phase of infection. The expected result is Negative. Fact Sheet for Patients:  StrictlyIdeas.no Fact Sheet for Healthcare Providers: BankingDealers.co.za This test is not yet approved or cleared by the [place] FDA and has been authorized for detection and/or diagnosis of SARS-CoV-2 by FDA under an Emergency Use Authorization (EUA).  This EUA will remain in effect (meaning this test can be used) for the duration of the COVID-19 declaration under Section 564(b)(1) of the Act, 21 U.S.C. section 360bbb-3(b)(1), unless the authorization is terminated or revoked sooner. Performed at [facility], [address] [place], [place] [address]    No  results found.  Pending Labs Unresulted Labs (From admission, onward)   None      Vitals/Pain Today's Vitals   02/13/19 1003 02/13/19 1008 02/13/19 1300 02/13/19 1341  BP:  (!) 175/64 (!) 162/66 (!) 194/76  Pulse:  65 60 (!) 58  Resp:  19  16  Temp:  98.5 F (36.9 C)    TempSrc:  Oral    SpO2:  94% 99% 96%  Weight: 122.5 kg     Height: 5\' 9"  (1.753 m)     PainSc: 0-No pain  Isolation Precautions No active isolations  Medications Medications  pantoprazole (PROTONIX) 80 mg in sodium chloride 0.9 % 250 mL (0.32 mg/mL) infusion (8 mg/hr Intravenous New Bag/Given 02/13/19 1340)  metoprolol succinate (TOPROL-XL) 24 hr tablet 100 mg (has no administration in time range)  atorvastatin (LIPITOR) tablet 80 mg (has no administration in time range)  amLODipine (NORVASC) tablet 5 mg (has no administration in time range)  acetaminophen (TYLENOL) tablet 650 mg (has no administration in time range)  gabapentin (NEURONTIN) capsule 100 mg (has no administration in time range)  levETIRAcetam (KEPPRA) tablet 1,000 mg (has no administration in time range)  levETIRAcetam (KEPPRA) tablet 250 mg (has no administration in time range)  pantoprazole (PROTONIX) 80 mg in sodium chloride 0.9 % 100 mL IVPB (0 mg Intravenous Stopped 02/13/19 1504)    Mobility walks Low fall risk   Focused Assessments Cardiac Assessment Handoff:    Lab Results  Component Value Date   CKTOTAL 89 09/05/2011   CKMB 2.3 05/18/2014   TROPONINI <0.03 12/31/2018   No results found for: DDIMER Does the Patient currently have chest pain? No     R Recommendations: See Admitting Provider Note  Report given to:   Additional Notes:

## 2019-02-13 NOTE — ED Provider Notes (Signed)
[facility]    CSN: [id number] Arrival date & time: 02/13/19  0803     History   Chief Complaint Chief Complaint  Patient presents with   Blood In Stools   Toe Pain    HPI Cynthia Dean is a 68 y.o. female.   68 year old female accompanied by a caregiver with concern over seeing blood in her stool and when she wipes for about a week. She usually has 2 bowel movements per day and has been seeing blood in the toilet as well as when she wipes. Bowel movements are mostly formed and usually not painful. She has not had these symptoms before. No personal history of cancer and she has had a colonoscopy in the past but can not remember how long ago. She denies any fever, abdominal pain, or vomiting. 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 She is also on insulin. Other chronic health issues include CAD, CVA, diabetic neuropathy, and COPD. Currently on Toprol XL, Norvasc, Losartan, Lipitor, Neurontin, Keppra, Plavix, aspirin, Demadex and Prilosec daily.  Her other concern is a wound at the tip of her left big toe for the past 4 days. She may have been "picking" at her toe which probably caused the wound. She does not recall any particular injury. It is not painful and currently bleeding and oozing yellowish discharge. She has not applied any medication to area.   The history is provided by the patient and a significant other.    Past Medical History:  Diagnosis Date   COPD (chronic obstructive pulmonary disease) ([place])    Coronary artery disease    Diabetes mellitus without complication ([place])    ESRD on dialysis ([place])    Heart murmur    Hyperlipidemia    Hypertension    Stroke [facility])     Patient Active Problem List   Diagnosis Date Noted   Acute respiratory failure with hypoxia ([place]) 12/31/2018   Nausea & vomiting 05/30/2018   Nausea and vomiting 05/29/2018   Partial seizure ([place]) 03/14/2018    Stroke ([place]) 03/07/2018   Slurred speech 11/14/2017   Diabetes ([place]) 11/12/2017   CAD (coronary artery disease) 11/12/2017   COPD (chronic obstructive pulmonary disease) ([place]) 11/12/2017   ESRD on dialysis ([place]) 11/12/2017   Hematemesis 06/27/2017   CVA (cerebral vascular accident) ([place]) 04/14/2016   Essential hypertension, benign 11/27/2015   Hyperlipidemia 11/27/2015   Obesity 11/27/2015   Prolonged Q-T interval on ECG 11/26/2015   Intractable nausea and vomiting 08/08/2015    Past Surgical History:  Procedure Laterality Date   A/V FISTULAGRAM Left 12/20/2016   Procedure: A/V Fistulagram;  Surgeon: Katha Cabal, MD;  Location: [place] [facility];  Service: Cardiovascular;  Laterality: Left;   A/V SHUNT INTERVENTION N/A 12/20/2016   Procedure: A/V Shunt Intervention;  Surgeon: Katha Cabal, MD;  Location: [place] [facility];  Service: Cardiovascular;  Laterality: N/A;   A/V SHUNTOGRAM Left 09/11/2017   Procedure: A/V SHUNTOGRAM;  Surgeon: Algernon Huxley, MD;  Location: [place] [facility];  Service: Cardiovascular;  Laterality: Left;   BREAST BIOPSY Bilateral 07/19/00   neg   LOOP RECORDER INSERTION N/A 11/16/2017   Procedure: LOOP RECORDER INSERTION;  Surgeon: Deboraha Sprang, MD;  Location: [place] [facility];  Service: Cardiovascular;  Laterality: N/A;   PERIPHERAL VASCULAR CATHETERIZATION Left 02/02/2015   Procedure: A/V Shuntogram/Fistulagram;  Surgeon: Algernon Huxley, MD;  Location: [facility]  INVASIVE CV LAB;  Service: Cardiovascular;  Laterality: Left;   PERIPHERAL VASCULAR CATHETERIZATION Left 02/02/2015   Procedure: A/V Shunt Intervention;  Surgeon: Algernon Huxley, MD;  Location: [place] [facility];  Service: Cardiovascular;  Laterality: Left;   PERIPHERAL VASCULAR CATHETERIZATION Left 03/09/2015   Procedure: A/V Shuntogram/Fistulagram;  Surgeon: Algernon Huxley, MD;  Location: [place] [facility];  Service: Cardiovascular;  Laterality: Left;   PERIPHERAL  VASCULAR CATHETERIZATION N/A 03/09/2015   Procedure: A/V Shunt Intervention;  Surgeon: Algernon Huxley, MD;  Location: [place] [facility];  Service: Cardiovascular;  Laterality: N/A;   PERIPHERAL VASCULAR CATHETERIZATION Left 04/17/2015   Procedure: Upper Extremity Angiography;  Surgeon: Algernon Huxley, MD;  Location: [place] [facility];  Service: Cardiovascular;  Laterality: Left;   PERIPHERAL VASCULAR CATHETERIZATION  04/17/2015   Procedure: Upper Extremity Intervention;  Surgeon: Algernon Huxley, MD;  Location: [place] [facility];  Service: Cardiovascular;;   PERIPHERAL VASCULAR CATHETERIZATION N/A 08/10/2015   Procedure: A/V Shuntogram/Fistulagram;  Surgeon: Algernon Huxley, MD;  Location: [place] [facility];  Service: Cardiovascular;  Laterality: N/A;   PERIPHERAL VASCULAR CATHETERIZATION N/A 08/10/2015   Procedure: A/V Shunt Intervention;  Surgeon: Algernon Huxley, MD;  Location: [place] [facility];  Service: Cardiovascular;  Laterality: N/A;   PERIPHERAL VASCULAR CATHETERIZATION Left 04/11/2016   Procedure: A/V Shuntogram/Fistulagram;  Surgeon: Algernon Huxley, MD;  Location: [place] [facility];  Service: Cardiovascular;  Laterality: Left;   TEE WITHOUT CARDIOVERSION N/A 11/16/2017   Procedure: TRANSESOPHAGEAL ECHOCARDIOGRAM (TEE);  Surgeon: Minna Merritts, MD;  Location: [facility];  Service: Cardiovascular;  Laterality: N/A;    OB History   No obstetric history on file.      Home Medications    Prior to Admission medications   Medication Sig Start Date End Date Taking? Authorizing Provider  acetaminophen (TYLENOL) 325 MG tablet Take 2 tablets (650 mg total) by mouth every 4 (four) hours as needed for mild pain (or temp > 37.5 C (99.5 F)). 11/17/17  Yes Gouru, Aruna, MD  amLODipine (NORVASC) 5 MG tablet Take 5 mg by mouth 2 (two) times daily. 08/07/18  Yes [provider]  aspirin EC 325 MG tablet Take 325 mg by mouth daily. 05/29/18  Yes [provider]  atorvastatin  (LIPITOR) 80 MG tablet Take 80 mg by mouth every evening.    Yes [provider]  clopidogrel (PLAVIX) 75 MG tablet TAKE ONE TABLET BY MOUTH ONCE DAILY Patient taking differently: Take 75 mg by mouth daily.  06/14/16  Yes Stegmayer, Joelene Millin A, PA-C  gabapentin (NEURONTIN) 100 MG capsule Take 100 mg by mouth 3 (three) times daily.   Yes [provider]  insulin NPH-regular Human (NOVOLIN 70/30) (70-30) 100 UNIT/ML injection Inject 25 Units into the skin 2 (two) times daily with a meal. Except dialysis days. Do not take on Tuesdays, Thursdays, and Saturdays. 11/17/17  Yes Gouru, Illene Silver, MD  levETIRAcetam (KEPPRA) 1000 MG tablet Take 1 tablet (1,000 mg total) by mouth daily. 03/15/18  Yes Vaughan Basta, MD  levETIRAcetam (KEPPRA) 250 MG tablet Take 1 tablet (250 mg total) by mouth 3 (three) times a week. Take after dialysis on Monday, wed and Friday in addition to regular dose. 03/14/18  Yes Vaughan Basta, MD  lidocaine-prilocaine (EMLA) cream Apply 1 application topically as needed (for port access).    Yes [provider]  losartan (COZAAR) 100 MG tablet Take 100 mg by mouth at bedtime.  09/12/17  Yes [provider]  metoprolol succinate (TOPROL-XL) 100 MG 24 hr tablet Take 100 mg by mouth daily. Take with or immediately following a meal.    Yes [provider]  omeprazole (PRILOSEC) 20 MG capsule Take 20 mg by mouth 2 (two) times daily.    Yes [provider]  torsemide (DEMADEX) 100 MG tablet Take 100 mg by mouth daily. 10/10/17  Yes [provider]    Family History Family History  Problem Relation Age of Onset   Breast cancer Father 31   Stroke Mother    Heart attack Mother    Heart Problems Sister    Breast cancer Cousin 60       1 st cousin. Maternal     Social History Social History   Tobacco Use   Smoking status: Former Smoker    Packs/day: 0.00    Types: Cigarettes   Smokeless tobacco: Never  Used  Substance Use Topics   Alcohol use: No   Drug use: No     Allergies   Tape and Tapentadol   Review of Systems Review of Systems  Constitutional: Positive for fatigue. Negative for activity change, appetite change, chills and fever.  HENT: Negative for congestion, sinus pressure, sinus pain and sore throat.   Respiratory: Positive for shortness of breath. Negative for cough, chest tightness and wheezing.   Cardiovascular: Negative for chest pain.  Gastrointestinal: Positive for blood in stool. Negative for abdominal pain, constipation, diarrhea, nausea, rectal pain and vomiting.  Genitourinary: Negative for decreased urine volume, difficulty urinating, dysuria and flank pain.  Skin: Positive for color change and wound. Negative for rash.  Allergic/Immunologic: Positive for immunocompromised state.  Neurological: Positive for light-headedness and numbness. Negative for seizures, syncope, weakness and headaches.  Hematological: Negative for adenopathy. Bruises/bleeds easily.     Physical Exam Triage Vital Signs ED Triage Vitals  Enc Vitals Group     BP 02/13/19 0816 (!) 165/74     Pulse Rate 02/13/19 0816 68     Resp 02/13/19 0816 18     Temp 02/13/19 0816 98.3 F (36.8 C)     Temp Source 02/13/19 0816 Oral     SpO2 02/13/19 0816 93 %     Weight --      Height 02/13/19 0812 5\' 9"  (4.097 m)     Head Circumference --      Peak Flow --      Pain Score 02/13/19 0812 0     Pain Loc --      Pain Edu? --      Excl. in [place]? --    No data found.  Updated Vital Signs BP (!) 165/74 (BP Location: Right Arm)    Pulse 68    Temp 98.3 F (36.8 C) (Oral)    Resp 18    Ht 5\' 9"  (1.753 m)    SpO2 93%    BMI 42.97 kg/m   Visual Acuity Right Eye Distance:   Left Eye Distance:   Bilateral Distance:    Right Eye Near:   Left Eye Near:    Bilateral Near:     Physical Exam Vitals signs and nursing note reviewed. Exam conducted with a chaperone present.  Constitutional:       General: She is awake. She is not in acute distress.    Appearance: She is well-developed and well-groomed. She is not ill-appearing.     Comments: Patient sitting comfortably on exam table in no acute distress.  HENT:     Head: Normocephalic and atraumatic.     Right Ear: External ear normal.     Left Ear: External ear normal.  Eyes:     Extraocular Movements: Extraocular movements intact.     Conjunctiva/sclera: Conjunctivae normal.  Neck:     Musculoskeletal: Normal range of motion.  Cardiovascular:     Rate and Rhythm: Normal rate and regular rhythm.     Pulses:          Dorsalis pedis pulses are 1+ on the left side.       Posterior tibial pulses are 1+ on the left side.  Pulmonary:     Effort: Pulmonary effort is normal. No respiratory distress.     Breath sounds: Normal breath sounds. No stridor. No wheezing or rhonchi.  Abdominal:     General: Bowel sounds are normal. There is no distension.     Palpations: Abdomen is soft.     Tenderness: There is no abdominal tenderness. There is no right CVA tenderness, left CVA tenderness, guarding or rebound.     Hernia: No hernia is present.  Genitourinary:    Exam position: Knee-chest position.     Rectum: No anal fissure or external hemorrhoid.     Comments: No distinct large external hemorrhoid seen. Slightly tender in area but no obvious bleeding externally. Internal exam not performed.  Musculoskeletal:       Feet:  Feet:     Right foot:     Skin integrity: Skin integrity normal.     Toenail Condition: Right toenails are abnormally thick.     Left foot:     Skin integrity: Skin breakdown and erythema present.     Toenail Condition: Left toenails are abnormally thick.  Skin:    General: Skin is warm.     Findings: Erythema and wound present. No bruising or ecchymosis.  Neurological:     Mental Status: She is alert and oriented to person, place, and time.     Sensory: Sensory deficit present.     Motor: Motor function  is intact.  Psychiatric:        Behavior: Behavior is cooperative.        UC Treatments / Results  Labs (all labs ordered are listed, but only abnormal results are displayed) Labs Reviewed - No data to display  EKG   Radiology No results found.  Procedures Procedures (including critical care time)  Medications Ordered in UC Medications - No data to display  Initial Impression / Assessment and Plan / UC Course  I have reviewed the triage vital signs and the nursing notes.  Pertinent labs & imaging results that were available during my care of the patient were reviewed by me and considered in my medical decision making (see chart for details).    Reviewed history and clinical findings with Marylene Land, NP. Due to unknown source for GI bleeding with patient with complex health issues including diabetic with chronic kidney disease on dialysis with insulin use and last Hgb about 1 month ago of 9, recommend further work-up at [facility] ER. Will provide dressing for wound on left toe. Called [facility] ER and spoke with Colletta Maryland, [place] nurse, regarding patient. She will come by personal vehicle to the ER now.  Final Clinical Impressions(s) / UC Diagnoses   Final diagnoses:  Blood in stool  Open toe wound, initial encounter  Type 2 diabetes mellitus with chronic kidney disease on chronic dialysis, with long-term current use of insulin ([place])  Discharge Instructions     Due to GI bleeding for unknown source (no distinct external hemorrhoid present), anemia (last Hgb of 9), diabetic with chronic kidney disease on dialysis and insulin, recommend further evaluation now at the ER. Please go to [facility] ER now.     ED Prescriptions    None     Controlled Substance Prescriptions [place] Controlled Substance Registry consulted? Not Applicable   Katy Apo, NP 02/13/19 [phone]

## 2019-02-13 NOTE — ED Triage Notes (Addendum)
Patient complains of blood in stool everytime that she has a BM since last week. Patient describes this as bright red blood.   Patient also has pain in left big toe, patient is diabetic and states that she does not remember injuring this.

## 2019-02-13 NOTE — ED Triage Notes (Signed)
Pt sent from [facility] with c/o bright red blood in stools for the past week, without any visible cause like hemorrhoids . Pt denies any pain,

## 2019-02-13 NOTE — ED Provider Notes (Signed)
[facility] Emergency Department Provider Note    First MD Initiated Contact with Patient 02/13/19 1228     (approximate)  I have reviewed the triage vital signs and the nursing notes.   HISTORY  Chief Complaint GI Bleeding    HPI Cynthia Dean is a 68 y.o. female   below listed past medical history on aspirin and Plavix presents the ER for evaluation of bright red blood per rectum that is been progressively worsening over the past 3 days.  Denies any history of hemorrhoids.  No pain.  No history of constipation.  Has not had any epigastric pain vomiting melena or coffee-ground emesis.  Has never had a history of GI bleeding before.  Was seen at med in urgent care and given the acuity of her events was sent to the ER for further evaluation.   Past Medical History:  Diagnosis Date   COPD (chronic obstructive pulmonary disease) ([place])    Coronary artery disease    Diabetes mellitus without complication ([place])    ESRD on dialysis ([place])    Heart murmur    Hyperlipidemia    Hypertension    Stroke [facility])    Family History  Problem Relation Age of Onset   Breast cancer Father 40   Stroke Mother    Heart attack Mother    Heart Problems Sister    Breast cancer Cousin 19       1 st cousin. Maternal    Past Surgical History:  Procedure Laterality Date   A/V FISTULAGRAM Left 12/20/2016   Procedure: A/V Fistulagram;  Surgeon: Katha Cabal, MD;  Location: [place] [facility];  Service: Cardiovascular;  Laterality: Left;   A/V SHUNT INTERVENTION N/A 12/20/2016   Procedure: A/V Shunt Intervention;  Surgeon: Katha Cabal, MD;  Location: [place] [facility];  Service: Cardiovascular;  Laterality: N/A;   A/V SHUNTOGRAM Left 09/11/2017   Procedure: A/V SHUNTOGRAM;  Surgeon: Algernon Huxley, MD;  Location: [place] [facility];  Service: Cardiovascular;  Laterality: Left;   BREAST BIOPSY Bilateral 07/19/00   neg   LOOP RECORDER  INSERTION N/A 11/16/2017   Procedure: LOOP RECORDER INSERTION;  Surgeon: Deboraha Sprang, MD;  Location: [place] [facility];  Service: Cardiovascular;  Laterality: N/A;   PERIPHERAL VASCULAR CATHETERIZATION Left 02/02/2015   Procedure: A/V Shuntogram/Fistulagram;  Surgeon: Algernon Huxley, MD;  Location: [place] [facility];  Service: Cardiovascular;  Laterality: Left;   PERIPHERAL VASCULAR CATHETERIZATION Left 02/02/2015   Procedure: A/V Shunt Intervention;  Surgeon: Algernon Huxley, MD;  Location: [place] [facility];  Service: Cardiovascular;  Laterality: Left;   PERIPHERAL VASCULAR CATHETERIZATION Left 03/09/2015   Procedure: A/V Shuntogram/Fistulagram;  Surgeon: Algernon Huxley, MD;  Location: [place] [facility];  Service: Cardiovascular;  Laterality: Left;   PERIPHERAL VASCULAR CATHETERIZATION N/A 03/09/2015   Procedure: A/V Shunt Intervention;  Surgeon: Algernon Huxley, MD;  Location: [place] [facility] LAB;  Service: Cardiovascular;  Laterality: N/A;   PERIPHERAL VASCULAR CATHETERIZATION Left 04/17/2015   Procedure: Upper Extremity Angiography;  Surgeon: Algernon Huxley, MD;  Location: [place] [facility];  Service: Cardiovascular;  Laterality: Left;   PERIPHERAL VASCULAR CATHETERIZATION  04/17/2015   Procedure: Upper Extremity Intervention;  Surgeon: Algernon Huxley, MD;  Location: [place] [facility];  Service: Cardiovascular;;   PERIPHERAL VASCULAR CATHETERIZATION N/A 08/10/2015   Procedure: A/V Shuntogram/Fistulagram;  Surgeon: Algernon Huxley, MD;  Location: [place] [facility];  Service:  Cardiovascular;  Laterality: N/A;   PERIPHERAL VASCULAR CATHETERIZATION N/A 08/10/2015   Procedure: A/V Shunt Intervention;  Surgeon: Algernon Huxley, MD;  Location: [place] [facility];  Service: Cardiovascular;  Laterality: N/A;   PERIPHERAL VASCULAR CATHETERIZATION Left 04/11/2016   Procedure: A/V Shuntogram/Fistulagram;  Surgeon: Algernon Huxley, MD;  Location: [place] [facility];  Service: Cardiovascular;  Laterality:  Left;   TEE WITHOUT CARDIOVERSION N/A 11/16/2017   Procedure: TRANSESOPHAGEAL ECHOCARDIOGRAM (TEE);  Surgeon: Minna Merritts, MD;  Location: [facility] ORS;  Service: Cardiovascular;  Laterality: N/A;   Patient Active Problem List   Diagnosis Date Noted   Acute respiratory failure with hypoxia ([place]) 12/31/2018   Nausea & vomiting 05/30/2018   Nausea and vomiting 05/29/2018   Partial seizure ([place]) 03/14/2018   Stroke ([place]) 03/07/2018   Slurred speech 11/14/2017   Diabetes ([place]) 11/12/2017   CAD (coronary artery disease) 11/12/2017   COPD (chronic obstructive pulmonary disease) ([place]) 11/12/2017   ESRD on dialysis ([place]) 11/12/2017   Hematemesis 06/27/2017   CVA (cerebral vascular accident) ([place]) 04/14/2016   Essential hypertension, benign 11/27/2015   Hyperlipidemia 11/27/2015   Obesity 11/27/2015   Prolonged Q-T interval on ECG 11/26/2015   Intractable nausea and vomiting 08/08/2015      Prior to Admission medications   Medication Sig Start Date End Date Taking? Authorizing Provider  acetaminophen (TYLENOL) 325 MG tablet Take 2 tablets (650 mg total) by mouth every 4 (four) hours as needed for mild pain (or temp > 37.5 C (99.5 F)). 11/17/17   Gouru, Illene Silver, MD  amLODipine (NORVASC) 5 MG tablet Take 5 mg by mouth 2 (two) times daily. 08/07/18   [provider]  aspirin EC 325 MG tablet Take 325 mg by mouth daily. 05/29/18   [provider]  atorvastatin (LIPITOR) 80 MG tablet Take 80 mg by mouth every evening.     [provider]  clopidogrel (PLAVIX) 75 MG tablet TAKE ONE TABLET BY MOUTH ONCE DAILY Patient taking differently: Take 75 mg by mouth daily.  06/14/16   Stegmayer, Joelene Millin A, PA-C  gabapentin (NEURONTIN) 100 MG capsule Take 100 mg by mouth 3 (three) times daily.    [provider]  insulin NPH-regular Human (NOVOLIN 70/30) (70-30) 100 UNIT/ML injection Inject 25 Units into the skin 2 (two) times daily with a meal. Except  dialysis days. Do not take on Tuesdays, Thursdays, and Saturdays. 11/17/17   Nicholes Mango, MD  levETIRAcetam (KEPPRA) 1000 MG tablet Take 1 tablet (1,000 mg total) by mouth daily. 03/15/18   Vaughan Basta, MD  levETIRAcetam (KEPPRA) 250 MG tablet Take 1 tablet (250 mg total) by mouth 3 (three) times a week. Take after dialysis on Monday, wed and Friday in addition to regular dose. 03/14/18   Vaughan Basta, MD  lidocaine-prilocaine (EMLA) cream Apply 1 application topically as needed (for port access).     [provider]  losartan (COZAAR) 100 MG tablet Take 100 mg by mouth at bedtime.  09/12/17   [provider]  metoprolol succinate (TOPROL-XL) 100 MG 24 hr tablet Take 100 mg by mouth daily. Take with or immediately following a meal.     [provider]  omeprazole (PRILOSEC) 20 MG capsule Take 20 mg by mouth 2 (two) times daily.     [provider]  torsemide (DEMADEX) 100 MG tablet Take 100 mg by mouth daily. 10/10/17   [provider]    Allergies Tape and Tapentadol    Social History Social  History   Tobacco Use   Smoking status: Former Smoker    Packs/day: 0.00    Types: Cigarettes   Smokeless tobacco: Never Used  Substance Use Topics   Alcohol use: No   Drug use: No    Review of Systems Patient denies headaches, rhinorrhea, blurry vision, numbness, shortness of breath, chest pain, edema, cough, abdominal pain, nausea, vomiting, diarrhea, dysuria, fevers, rashes or hallucinations unless otherwise stated above in HPI. ____________________________________________   PHYSICAL EXAM:  VITAL SIGNS: Vitals:   02/13/19 1008  BP: (!) 175/64  Pulse: 65  Resp: 19  Temp: 98.5 F (36.9 C)  SpO2: 94%    Constitutional: Alert and oriented.  Eyes: Conjunctivae are normal.  Head: Atraumatic. Nose: No congestion/rhinnorhea. Mouth/Throat: Mucous membranes are moist.   Neck: No stridor. Painless ROM.  Cardiovascular:  Normal rate, regular rhythm. Grossly normal heart sounds.  Good peripheral circulation. Respiratory: Normal respiratory effort.  No retractions. Lungs CTAB. Gastrointestinal: Soft and nontender. No distention. No abdominal bruits. No CVA tenderness. Genitourinary: Small nonthrombosed nonulcerated nonbleeding external hemorrhoid.  No evidence of anal fissure.  No mass.  No rectal lesions noted. Musculoskeletal: No lower extremity tenderness nor edema.  No joint effusions. Neurologic:  Normal speech and language. No gross focal neurologic deficits are appreciated. No facial droop Skin:  Skin is warm, dry and intact. No rash noted. Psychiatric: Mood and affect are normal. Speech and behavior are normal.  ____________________________________________   LABS (all labs ordered are listed, but only abnormal results are displayed)  Results for orders placed or performed during the hospital encounter of 02/13/19 (from the past 24 hour(s))  Comprehensive metabolic panel     Status: Abnormal   Collection Time: 02/13/19 10:14 AM  Result Value Ref Range   Sodium 138 135 - 145 mmol/L   Potassium 5.5 (H) 3.5 - 5.1 mmol/L   Chloride 100 98 - 111 mmol/L   CO2 23 22 - 32 mmol/L   Glucose, Bld 240 (H) 70 - 99 mg/dL   BUN 56 (H) 8 - 23 mg/dL   Creatinine, Ser 10.00 (H) 0.44 - 1.00 mg/dL   Calcium 8.7 (L) 8.9 - 10.3 mg/dL   Total Protein 7.3 6.5 - 8.1 g/dL   Albumin 3.7 3.5 - 5.0 g/dL   AST 17 15 - 41 U/L   ALT 17 0 - 44 U/L   Alkaline Phosphatase 119 38 - 126 U/L   Total Bilirubin 0.6 0.3 - 1.2 mg/dL   GFR calc non Af Amer 4 (L) >60 mL/min   GFR calc Af Amer 4 (L) >60 mL/min   Anion gap 15 5 - 15  CBC     Status: Abnormal   Collection Time: 02/13/19 10:14 AM  Result Value Ref Range   WBC 7.0 4.0 - 10.5 K/uL   RBC 3.60 (L) 3.87 - 5.11 MIL/uL   Hemoglobin 10.0 (L) 12.0 - 15.0 g/dL   HCT 33.2 (L) 36.0 - 46.0 %   MCV 92.2 80.0 - 100.0 fL   MCH 27.8 26.0 - 34.0 pg   MCHC 30.1 30.0 - 36.0 g/dL    RDW 14.4 11.5 - 15.5 %   Platelets 188 150 - 400 K/uL   nRBC 0.0 0.0 - 0.2 %  Type and screen [facility]     Status: None   Collection Time: 02/13/19 10:14 AM  Result Value Ref Range   ABO/RH(D) A POS    Antibody Screen NEG    Sample Expiration  02/16/2019,2359 Performed at [facility], [address] [place], [place] [address]    ____________________________________________ ____________________________________________  RADIOLOGY   ____________________________________________   PROCEDURES  Procedure(s) performed:  Procedures    Critical Care performed: no ____________________________________________   INITIAL IMPRESSION / ASSESSMENT AND PLAN / ED COURSE  Pertinent labs & imaging results that were available during my care of the patient were reviewed by me and considered in my medical decision making (see chart for details).   DDX: ugib, lgib, hemorrhoid, anal fissure, mass,  Cynthia Dean is a 68 y.o. who presents to the ED with bright red blood per rectum is been worsening over the past few days.  Hemoglobin stable but patient is high risk that she is on aspirin Plavix as well as hemodialysis.  She did miss dialysis today but does not appear volume overloaded or in any respiratory distress.  Potassium mildly elevated at 5.5.  Rectal exam with nonbleeding external hemorrhoid.  Do not observe any rectal masses.  Given her history certainly concerning for upper GI bleed possible diverticulosis though CT imaging in 2018 did not show any evidence of diverticuli.  There was a cecal node.  Possible malignancy.  Based on patient's risk factors do feel observation for serial hemoglobins is indicated.  Will start on IV Protonix for possible upper GI component.  Be admitted to hospital for further medical management.     The patient was evaluated in Emergency Department today for the symptoms described in the history of present illness.  He/she was evaluated in the context of the global COVID-19 pandemic, which necessitated consideration that the patient might be at risk for infection with the SARS-CoV-2 virus that causes COVID-19. Institutional protocols and algorithms that pertain to the evaluation of patients at risk for COVID-19 are in a state of rapid change based on information released by regulatory bodies including the CDC and federal and state organizations. These policies and algorithms were followed during the patient's care in the ED.  As part of my medical decision making, I reviewed the following data within the [place] notes reviewed and incorporated, Labs reviewed, notes from prior ED visits and [place] Controlled Substance Database   ____________________________________________   FINAL CLINICAL IMPRESSION(S) / ED DIAGNOSES  Final diagnoses:  Bright red blood per rectum      NEW MEDICATIONS STARTED DURING THIS VISIT:  New Prescriptions   No medications on file     Note:  This document was prepared using Dragon voice recognition software and may include unintentional dictation errors.    Merlyn Lot, MD 02/13/19 1257

## 2019-02-13 NOTE — Discharge Instructions (Addendum)
Due to GI bleeding for unknown source (no distinct external hemorrhoid present), anemia (last Hgb of 9), diabetic with chronic kidney disease on dialysis and insulin, recommend further evaluation now at the ER. Please go to [facility] ER now.

## 2019-02-13 NOTE — H&P (View-Only) (Signed)
GI Inpatient Consult Note  Reason for Consult: Hematochezia    Attending Requesting Consult: Dr. Merlyn Lot, ED  History of Present Illness: Cynthia Dean is a 68 y.o. female seen for evaluation of hematochezia at the request of ED Physician - Dr. Denyce Robert. Pt has a PMH of ESRD on hemodialysis MWF, COPD, CAD, T2DM, HLD, HTN, Hx of recurrent CVA on aspirin and Plavix presented to the [facility] today for complains of hematochezia. Pt reports she has noticed bright red blood on the tissue paper when she wipes and in the toilet bowl worsening over the past 3 days. She reports she first noticed the blood about one month ago and has been constant over this time. She has seen blood with every single BM over this time. She denies any associated weakness, fatigue, or shortness of breath away from baseline. She reports previous colonoscopy >7 years ago which was reportedly negative. She denies any known family hx of colon cancer, adenomatous polyps, or IBD. No prior history of GI bleeding. She denies any abdominal pain, nausea, vomiting, dysphagia, odynophagia, early satiety, or epigastric pain. She denies any significant pyrosis or reflux symptoms. She denies frequent NSAID use outside of her daily baby aspirin use. No known sick contacts or recent travel. CT abd/pelvis 06/2017 showed 22mm submucosal lipoma within the cecum. Labs upon arrival to the ED were significant for potassium 5.5, Creatinine 10.00, BUN 56, hemoglobin 10.0, and WBC 7.0. Hemoglobin over the past month has been in the low 9s. Hemoglobin 8 months ago was 12.0.   Last Colonoscopy: Colonoscopy 01/2008 - no report available  Last Endoscopy: 2015 - Hx of PUD   Past Medical History:  Past Medical History:  Diagnosis Date  . COPD (chronic obstructive pulmonary disease) ([place])   . Coronary artery disease   . Diabetes mellitus without complication ([place])   . ESRD on dialysis ([place])   . Heart murmur   .  Hyperlipidemia   . Hypertension   . Stroke [facility])     Problem List: Patient Active Problem List   Diagnosis Date Noted  . Acute respiratory failure with hypoxia ([place]) 12/31/2018  . Nausea & vomiting 05/30/2018  . Nausea and vomiting 05/29/2018  . Partial seizure ([place]) 03/14/2018  . Stroke ([place]) 03/07/2018  . Slurred speech 11/14/2017  . Diabetes ([place]) 11/12/2017  . CAD (coronary artery disease) 11/12/2017  . COPD (chronic obstructive pulmonary disease) ([place]) 11/12/2017  . ESRD on dialysis ([place]) 11/12/2017  . Hematemesis 06/27/2017  . CVA (cerebral vascular accident) ([place]) 04/14/2016  . Essential hypertension, benign 11/27/2015  . Hyperlipidemia 11/27/2015  . Obesity 11/27/2015  . Prolonged Q-T interval on ECG 11/26/2015  . Intractable nausea and vomiting 08/08/2015    Past Surgical History: Past Surgical History:  Procedure Laterality Date  . A/V FISTULAGRAM Left 12/20/2016   Procedure: A/V Fistulagram;  Surgeon: Katha Cabal, MD;  Location: [place] [facility];  Service: Cardiovascular;  Laterality: Left;  . A/V SHUNT INTERVENTION N/A 12/20/2016   Procedure: A/V Shunt Intervention;  Surgeon: Katha Cabal, MD;  Location: [place] [facility];  Service: Cardiovascular;  Laterality: N/A;  . A/V SHUNTOGRAM Left 09/11/2017   Procedure: A/V SHUNTOGRAM;  Surgeon: Algernon Huxley, MD;  Location: [place] [facility];  Service: Cardiovascular;  Laterality: Left;  . BREAST BIOPSY Bilateral 07/19/00   neg  . LOOP RECORDER INSERTION N/A 11/16/2017   Procedure: LOOP RECORDER INSERTION;  Surgeon: Deboraha Sprang, MD;  Location: [facility]  [facility];  Service: Cardiovascular;  Laterality: N/A;  . PERIPHERAL VASCULAR CATHETERIZATION Left 02/02/2015   Procedure: A/V Shuntogram/Fistulagram;  Surgeon: Algernon Huxley, MD;  Location: [place] [facility] LAB;  Service: Cardiovascular;  Laterality: Left;  . PERIPHERAL VASCULAR CATHETERIZATION Left 02/02/2015   Procedure: A/V Shunt Intervention;   Surgeon: Algernon Huxley, MD;  Location: [place] [facility];  Service: Cardiovascular;  Laterality: Left;  . PERIPHERAL VASCULAR CATHETERIZATION Left 03/09/2015   Procedure: A/V Shuntogram/Fistulagram;  Surgeon: Algernon Huxley, MD;  Location: [place] [facility] LAB;  Service: Cardiovascular;  Laterality: Left;  . PERIPHERAL VASCULAR CATHETERIZATION N/A 03/09/2015   Procedure: A/V Shunt Intervention;  Surgeon: Algernon Huxley, MD;  Location: [place] [facility];  Service: Cardiovascular;  Laterality: N/A;  . PERIPHERAL VASCULAR CATHETERIZATION Left 04/17/2015   Procedure: Upper Extremity Angiography;  Surgeon: Algernon Huxley, MD;  Location: [place] [facility] LAB;  Service: Cardiovascular;  Laterality: Left;  . PERIPHERAL VASCULAR CATHETERIZATION  04/17/2015   Procedure: Upper Extremity Intervention;  Surgeon: Algernon Huxley, MD;  Location: [place] [facility] LAB;  Service: Cardiovascular;;  . PERIPHERAL VASCULAR CATHETERIZATION N/A 08/10/2015   Procedure: A/V Shuntogram/Fistulagram;  Surgeon: Algernon Huxley, MD;  Location: [place] [facility];  Service: Cardiovascular;  Laterality: N/A;  . PERIPHERAL VASCULAR CATHETERIZATION N/A 08/10/2015   Procedure: A/V Shunt Intervention;  Surgeon: Algernon Huxley, MD;  Location: [place] [facility] LAB;  Service: Cardiovascular;  Laterality: N/A;  . PERIPHERAL VASCULAR CATHETERIZATION Left 04/11/2016   Procedure: A/V Shuntogram/Fistulagram;  Surgeon: Algernon Huxley, MD;  Location: [place] [facility];  Service: Cardiovascular;  Laterality: Left;  . TEE WITHOUT CARDIOVERSION N/A 11/16/2017   Procedure: TRANSESOPHAGEAL ECHOCARDIOGRAM (TEE);  Surgeon: Minna Merritts, MD;  Location: [facility] ORS;  Service: Cardiovascular;  Laterality: N/A;    Allergies: Allergies  Allergen Reactions  . Tape Itching    Skin Dermatitis/itching (tape adhesive) Skin Dermatitis/itching (tape adhesive)  . Tapentadol Itching    Skin Dermatitis/itching (tape adhesive) Skin Dermatitis/itching (tape adhesive)     Home  Medications: (Not in a hospital admission)  Home medication reconciliation was completed with the patient.   Scheduled Inpatient Medications:    Continuous Inpatient Infusions:   . pantoprazole (PROTONIX) IVPB    . pantoprozole (PROTONIX) infusion      PRN Inpatient Medications:    Family History: family history includes Breast cancer (age of onset: 38) in her cousin; Breast cancer (age of onset: 42) in her father; Heart Problems in her sister; Heart attack in her mother; Stroke in her mother.  The patient's family history is negative for inflammatory bowel disorders, GI malignancy, or solid organ transplantation.  Social History:   reports that she has quit smoking. Her smoking use included cigarettes. She smoked 0.00 packs per day. She has never used smokeless tobacco. She reports that she does not drink alcohol or use drugs. The patient denies ETOH, tobacco, or drug use.   Review of Systems: Constitutional: Weight is stable.  Eyes: No changes in vision. ENT: No oral lesions, sore throat.  GI: see HPI.  Heme/Lymph: No easy bruising.  CV: No chest pain.  GU: No hematuria.  Integumentary: No rashes.  Neuro: No headaches.  Psych: No depression/anxiety.  Endocrine: No heat/cold intolerance.  Allergic/Immunologic: No urticaria.  Resp: No cough, SOB.  Musculoskeletal: No joint swelling.    Physical Examination: BP (!) 175/64 (BP Location: Right Arm)   Pulse 65   Temp 98.5 F (36.9 C) (Oral)  Resp 19   Ht 5\' 9"  (1.753 m)   Wt 122.5 kg   SpO2 94%   BMI 39.87 kg/m  Gen: NAD, alert and oriented x 4 HEENT: PEERLA, EOMI, Neck: supple, no JVD or thyromegaly Chest: CTA bilaterally, no wheezes, crackles, or other adventitious sounds CV: RRR, no m/g/c/r Abd: soft, ND, obese abdomen, hypoactive BS in all four quadrants; mild TTP in RLQ and LLQ, no HSM, guarding, ridigity, or rebound tenderness Ext: no edema, well perfused with 2+ pulses, Skin: no rash or lesions  noted Lymph: no LAD  Data: Lab Results  Component Value Date   WBC 7.0 02/13/2019   HGB 10.0 (L) 02/13/2019   HCT 33.2 (L) 02/13/2019   MCV 92.2 02/13/2019   PLT 188 02/13/2019   Recent Labs  Lab 02/13/19 1014  HGB 10.0*   Lab Results  Component Value Date   NA 138 02/13/2019   K 5.5 (H) 02/13/2019   CL 100 02/13/2019   CO2 23 02/13/2019   BUN 56 (H) 02/13/2019   CREATININE 10.00 (H) 02/13/2019   Lab Results  Component Value Date   ALT 17 02/13/2019   AST 17 02/13/2019   ALKPHOS 119 02/13/2019   BILITOT 0.6 02/13/2019   No results for input(s): APTT, INR, PTT in the last 168 hours. Assessment/Plan:  68 y/o AA female with a PMH of ESRD on hemodialysis MWF, CAD, Hx of recurrent CVA, chronic antiplatelet therapy with Plavix and ASA, HTN, HLD, T2DM presented to the ED for hematochezia x 1 month  1. Hematochezia 2. Acute blood loss anemia 3. Multiple comorbidities - ESRD on HD, CAD, chronic antiplatelet therapy, COPD  - Pt presented to the ED for hematochezia x 1 month with worsening over the past 3 days. She has had roughly 3 gram drop in Hgb over the past 8 months. Hgb over the past month has remained stable. No evidence of active GI bleeding at this time.  - Differential includes anal outlet etiology, diverticular bleed, polyp, malignancy, AVMs, upper GI source less likely - Agree with PPI - Continue to monitor H&H. Transfuse for Hgb <7.0. - Hold all antiplatelet therapy at this time - Recommend luminal evaluation when clinically feasible. Would like for her to be off Plavix for at least 48 hours before any invasive evaluation. Will need to coordinate patient to potentially have hemodialysis in the morning and colonoscopy potentially Friday afternoon. - I reviewed the risks (including bleeding, perforation, infection, anesthesia complications, cardiac/respiratory complications), benefits and alternatives of colonoscopy. Patient consents to proceed.  - Potentially  colonoscopy on Friday - We will continue to follow    Thank you for the consult. Please call with questions or concerns.  Reeves Forth [facility] [phone] (Cell)

## 2019-02-14 LAB — CBC WITH DIFFERENTIAL/PLATELET
Abs Immature Granulocytes: 0.04 10*3/uL (ref 0.00–0.07)
Basophils Absolute: 0 10*3/uL (ref 0.0–0.1)
Basophils Relative: 1 %
Eosinophils Absolute: 0.1 10*3/uL (ref 0.0–0.5)
Eosinophils Relative: 2 %
HCT: 33.7 % — ABNORMAL LOW (ref 36.0–46.0)
Hemoglobin: 10.4 g/dL — ABNORMAL LOW (ref 12.0–15.0)
Immature Granulocytes: 1 %
Lymphocytes Relative: 32 %
Lymphs Abs: 2 10*3/uL (ref 0.7–4.0)
MCH: 28.5 pg (ref 26.0–34.0)
MCHC: 30.9 g/dL (ref 30.0–36.0)
MCV: 92.3 fL (ref 80.0–100.0)
Monocytes Absolute: 0.5 10*3/uL (ref 0.1–1.0)
Monocytes Relative: 8 %
Neutro Abs: 3.6 10*3/uL (ref 1.7–7.7)
Neutrophils Relative %: 56 %
Platelets: 189 10*3/uL (ref 150–400)
RBC: 3.65 MIL/uL — ABNORMAL LOW (ref 3.87–5.11)
RDW: 14.4 % (ref 11.5–15.5)
WBC: 6.3 10*3/uL (ref 4.0–10.5)
nRBC: 0 % (ref 0.0–0.2)

## 2019-02-14 LAB — BASIC METABOLIC PANEL
Anion gap: 13 (ref 5–15)
BUN: 65 mg/dL — ABNORMAL HIGH (ref 8–23)
CO2: 23 mmol/L (ref 22–32)
Calcium: 8.3 mg/dL — ABNORMAL LOW (ref 8.9–10.3)
Chloride: 105 mmol/L (ref 98–111)
Creatinine, Ser: 11.18 mg/dL — ABNORMAL HIGH (ref 0.44–1.00)
GFR calc Af Amer: 4 mL/min — ABNORMAL LOW (ref >60)
GFR calc non Af Amer: 3 mL/min — ABNORMAL LOW (ref >60)
Glucose, Bld: 195 mg/dL — ABNORMAL HIGH (ref 70–99)
Potassium: 5.4 mmol/L — ABNORMAL HIGH (ref 3.5–5.1)
Sodium: 141 mmol/L (ref 135–145)

## 2019-02-14 LAB — GLUCOSE, CAPILLARY
Glucose-Capillary: 208 mg/dL — ABNORMAL HIGH (ref 70–99)
Glucose-Capillary: 189 mg/dL — ABNORMAL HIGH (ref 70–99)
Glucose-Capillary: 145 mg/dL — ABNORMAL HIGH (ref 70–99)
Glucose-Capillary: 179 mg/dL — ABNORMAL HIGH (ref 70–99)

## 2019-02-14 LAB — MAGNESIUM: Magnesium: 1.9 mg/dL (ref 1.7–2.4)

## 2019-02-14 MED ORDER — CHLORHEXIDINE GLUCONATE CLOTH 2 % EX PADS: 6.0000 | MEDICATED_PAD | Freq: Every day | CUTANEOUS | Status: DC

## 2019-02-14 MED ORDER — INSULIN DETEMIR 100 UNIT/ML ~~LOC~~ SOLN
5.0000 [IU] | Freq: Two times a day (BID) | SUBCUTANEOUS | Status: DC
  Administered 2019-02-14 – 2019-02-16 (×4): 5 [IU] via SUBCUTANEOUS
  Filled 2019-02-14 (×6): qty 0.05

## 2019-02-14 MED ORDER — INSULIN ASPART 100 UNIT/ML ~~LOC~~ SOLN: 0.0000 [IU] | Freq: Every day | SUBCUTANEOUS | Status: DC

## 2019-02-14 MED ORDER — INSULIN ASPART 100 UNIT/ML ~~LOC~~ SOLN
0.0000 [IU] | Freq: Three times a day (TID) | SUBCUTANEOUS | Status: DC
  Administered 2019-02-14 – 2019-02-15 (×2): 1 [IU] via SUBCUTANEOUS
  Administered 2019-02-15: 2 [IU] via SUBCUTANEOUS
  Administered 2019-02-16 (×2): 1 [IU] via SUBCUTANEOUS
  Filled 2019-02-14 (×6): qty 1

## 2019-02-14 NOTE — Progress Notes (Signed)
Inpatient Diabetes Program Recommendations  AACE/ADA: New Consensus Statement on Inpatient Glycemic Control (2015)  Target Ranges:  Prepandial:   less than 140 mg/dL      Peak postprandial:   less than 180 mg/dL (1-2 hours)      Critically ill patients:  140 - 180 mg/dL   Lab Results  Component Value Date   GLUCAP 208 (H) 02/14/2019   HGBA1C 8.2 (H) 02/13/2019    Review of Glycemic Control Results for TORI, DATTILIO (MRN [id number]) as of 02/14/2019 09:10  Ref. Range 02/13/2019 20:10 02/13/2019 23:56 02/14/2019 07:46  Glucose-Capillary Latest Ref Range: 70 - 99 mg/dL 172 (H) 155 (H) 208 (H)   Diabetes history: DM Outpatient Diabetes medications: 70/30 25 units bid (Does not take on dialysis days of Tuesday, Thursday and Saturdays) Current orders for Inpatient glycemic control:  Novolog moderate tid with meals and HS Inpatient Diabetes Program Recommendations:    May consider reducing Novolog correction to sensitive.  Also consider adding Levemir 8 units bid while in the hospital.   Thanks,  Adah Perl, RN, BC-ADM Inpatient Diabetes Coordinator Pager [phone] (8a-5p)

## 2019-02-14 NOTE — Progress Notes (Signed)
[facility]  ROUNDING NOTE   Subjective:  Patient presents now with acute GI bleed with blood per rectum. Due for dialysis today as her last treatment was on Monday.   Objective:  Vital signs in last 24 hours:  Temp:  [97.6 F (36.4 C)-97.8 F (36.6 C)] 97.7 F (36.5 C) (07/23 0422) Pulse Rate:  [57-70] 69 (07/23 0832) Resp:  [14-20] 20 (07/23 0422) BP: (132-189)/(68-84) 168/68 (07/23 0832) SpO2:  [93 %-100 %] 93 % (07/23 0422)  Weight change:  Filed Weights   02/13/19 1003  Weight: 122.5 kg    Intake/Output: I/O last 3 completed shifts: In: 511.9 [I.V.:411.9; IV Piggyback:100] Out: 400 [Urine:400]   Intake/Output this shift:  Total I/O In: 398.6 [P.O.:240; I.V.:158.6] Out: -   Physical Exam: General: No acute distress  Head: Normocephalic, atraumatic. Moist oral mucosal membranes  Eyes: Anicteric  Neck: Supple, trachea midline  Lungs:  Clear to auscultation, normal effort  Heart: S1S2 no rubs  Abdomen:  Soft, nontender, bowel sounds present  Extremities: No peripheral edema.  Neurologic: Awake, alert, following commands  Skin: No lesions  Access: Left upper extremity AV graft    Basic Metabolic Panel: Recent Labs  Lab 02/13/19 1014 02/14/19 0505  NA 138 141  K 5.5* 5.4*  CL 100 105  CO2 23 23  GLUCOSE 240* 195*  BUN 56* 65*  CREATININE 10.00* 11.18*  CALCIUM 8.7* 8.3*  MG  --  1.9    Liver Function Tests: Recent Labs  Lab 02/13/19 1014  AST 17  ALT 17  ALKPHOS 119  BILITOT 0.6  PROT 7.3  ALBUMIN 3.7   No results for input(s): LIPASE, AMYLASE in the last 168 hours. No results for input(s): AMMONIA in the last 168 hours.  CBC: Recent Labs  Lab 02/13/19 1014 02/14/19 0505  WBC 7.0 6.3  NEUTROABS  --  3.6  HGB 10.0* 10.4*  HCT 33.2* 33.7*  MCV 92.2 92.3  PLT 188 189    Cardiac Enzymes: No results for input(s): CKTOTAL, CKMB, CKMBINDEX, TROPONINI in the last 168 hours.  BNP: Invalid input(s):  POCBNP  CBG: Recent Labs  Lab 02/13/19 1652 02/13/19 2010 02/13/19 2356 02/14/19 0746 02/14/19 1131  GLUCAP 196* 172* 155* 208* 189*    Microbiology: Results for orders placed or performed during the hospital encounter of 02/13/19  SARS Coronavirus 2 (CEPHEID- Performed in [place] hospital lab), Hosp Order     Status: None   Collection Time: 02/13/19 12:41 PM   Specimen: Nasopharyngeal Swab  Result Value Ref Range Status   SARS Coronavirus 2 NEGATIVE NEGATIVE Final    Comment: (NOTE) If result is NEGATIVE SARS-CoV-2 target nucleic acids are NOT DETECTED. The SARS-CoV-2 RNA is generally detectable in upper and lower  respiratory specimens during the acute phase of infection. The lowest  concentration of SARS-CoV-2 viral copies this assay can detect is 250  copies / mL. A negative result does not preclude SARS-CoV-2 infection  and should not be used as the sole basis for treatment or other  patient management decisions.  A negative result may occur with  improper specimen collection / handling, submission of specimen other  than nasopharyngeal swab, presence of viral mutation(s) within the  areas targeted by this assay, and inadequate number of viral copies  (<250 copies / mL). A negative result must be combined with clinical  observations, patient history, and epidemiological information. If result is POSITIVE SARS-CoV-2 target nucleic acids are DETECTED. The SARS-CoV-2 RNA is generally detectable  in upper and lower  respiratory specimens dur ing the acute phase of infection.  Positive  results are indicative of active infection with SARS-CoV-2.  Clinical  correlation with patient history and other diagnostic information is  necessary to determine patient infection status.  Positive results do  not rule out bacterial infection or co-infection with other viruses. If result is PRESUMPTIVE POSTIVE SARS-CoV-2 nucleic acids MAY BE PRESENT.   A presumptive positive result  was obtained on the submitted specimen  and confirmed on repeat testing.  While 2019 novel coronavirus  (SARS-CoV-2) nucleic acids may be present in the submitted sample  additional confirmatory testing may be necessary for epidemiological  and / or clinical management purposes  to differentiate between  SARS-CoV-2 and other Sarbecovirus currently known to infect humans.  If clinically indicated additional testing with an alternate test  methodology [phone]) is advised. The SARS-CoV-2 RNA is generally  detectable in upper and lower respiratory sp ecimens during the acute  phase of infection. The expected result is Negative. Fact Sheet for Patients:  StrictlyIdeas.no Fact Sheet for Healthcare Providers: BankingDealers.co.za This test is not yet approved or cleared by the [place] FDA and has been authorized for detection and/or diagnosis of SARS-CoV-2 by FDA under an Emergency Use Authorization (EUA).  This EUA will remain in effect (meaning this test can be used) for the duration of the COVID-19 declaration under Section 564(b)(1) of the Act, 21 U.S.C. section 360bbb-3(b)(1), unless the authorization is terminated or revoked sooner. Performed at [facility], [address] [place], [place] [address]   MRSA PCR Screening     Status: None   Collection Time: 02/13/19  5:15 PM   Specimen: Nasopharyngeal  Result Value Ref Range Status   MRSA by PCR NEGATIVE NEGATIVE Final    Comment:        The GeneXpert MRSA Assay (FDA approved for NASAL specimens only), is one component of a comprehensive MRSA colonization surveillance program. It is not intended to diagnose MRSA infection nor to guide or monitor treatment for MRSA infections. Performed at [facility], [address] [place], [place] [address]     Coagulation Studies: No results for input(s): LABPROT, INR in the last 72 hours.  Urinalysis: No  results for input(s): COLORURINE, LABSPEC, PHURINE, GLUCOSEU, HGBUR, BILIRUBINUR, KETONESUR, PROTEINUR, UROBILINOGEN, NITRITE, LEUKOCYTESUR in the last 72 hours.  Invalid input(s): APPERANCEUR    Imaging: No results found.   Medications:   . pantoprozole (PROTONIX) infusion 8 mg/hr (02/14/19 1453)   . amLODipine  5 mg Oral BID  . atorvastatin  80 mg Oral QPM  . Chlorhexidine Gluconate Cloth  6 each Topical Q0600  . gabapentin  100 mg Oral TID  . insulin aspart  0-5 Units Subcutaneous QHS  . insulin aspart  0-9 Units Subcutaneous TID WC  . insulin detemir  5 Units Subcutaneous BID  . levETIRAcetam  1,000 mg Oral Daily  . levETIRAcetam  250 mg Oral Q M,W,F-2000  . metoprolol succinate  100 mg Oral Daily  . polyethylene glycol-electrolytes  4,000 mL Oral Once   acetaminophen, hydrALAZINE  Assessment/ Plan:  68 y.o. 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  [facility]WF/[facility] [place] [facility]/ 225 min  1.  ESRD on HD MWF.  Last dialysis treatment was on Monday.  She missed dialysis treatment yesterday.  We will plan for hemodialysis today given missed treatment yesterday.  2.  Anemia of chronic kidney disease.  Hemoglobin currently 10.4.  Hold off on Epogen for now.  Continue to monitor CBC closely given GI bleed.  3.  Secondary hyperparathyroidism.  Check intact PTH and phosphorus with treatment today.  4.  Hypertension.  Maintain the patient on amlodipine and metoprolol at the current doses.   LOS: 1 Cynthia Dean 7/23/20203:15 PM

## 2019-02-14 NOTE — Progress Notes (Signed)
Established hemodialysis patient known at [facility] MWF 6:00am, patient transports with [facility]. Please note that any change in COVID or mobility status may change this plan. Please contact me directly with dialysis placement concerns.  Elvera Bicker  Dialysis Coordinator [phone]

## 2019-02-14 NOTE — Plan of Care (Signed)
Patient doing well this shift.  No BM since yesterday.  HGb stable this morning.  Patient voiding small amount.  Continued on the protonix drip.  Plan is for patient to have HD today and then EGD and colonoscopy tomorrow.  Will start the bowel prep this evening.  I talked to the patients daughter today and gave her an update on the plan of care.  No significant changes.

## 2019-02-14 NOTE — Progress Notes (Signed)
[facility] [place] NAME: Cynthia Dean    MR#:  [id number]  DATE OF BIRTH:  Mar 26, 1951  SUBJECTIVE:  patient came in with rectal bleed. Denies any vomiting. No abdominal pain. Tolerating clear liquid diet.  REVIEW OF SYSTEMS:   Review of Systems  Constitutional: Negative for chills, fever and weight loss.  HENT: Negative for ear discharge, ear pain and nosebleeds.   Eyes: Negative for blurred vision, pain and discharge.  Respiratory: Negative for sputum production, shortness of breath, wheezing and stridor.   Cardiovascular: Negative for chest pain, palpitations, orthopnea and PND.  Gastrointestinal: Positive for blood in stool. Negative for abdominal pain, diarrhea, nausea and vomiting.  Genitourinary: Negative for frequency and urgency.  Musculoskeletal: Negative for back pain and joint pain.  Neurological: Positive for weakness. Negative for sensory change, speech change and focal weakness.  Psychiatric/Behavioral: Negative for depression and hallucinations. The patient is not nervous/anxious.    Tolerating Diet: CLD Tolerating PT: pending  DRUG ALLERGIES:   Allergies  Allergen Reactions  . Hydrocodone     Intolerant more than allergic  . Tape Itching    Skin Dermatitis/itching (tape adhesive) Skin Dermatitis/itching (tape adhesive)  . Tapentadol Itching    Skin Dermatitis/itching (tape adhesive) Skin Dermatitis/itching (tape adhesive)     VITALS:  Blood pressure (!) 168/68, pulse 69, temperature 97.7 F (36.5 C), temperature source Oral, resp. rate 20, height 5\' 9"  (1.753 m), weight 122.5 kg, SpO2 93 %.  PHYSICAL EXAMINATION:   Physical Exam  GENERAL:  68 y.o.-year-old patient lying in the bed with no acute distress. obese EYES: Pupils equal, round, reactive to light and accommodation. No scleral icterus. Extraocular muscles intact.  HEENT: Head atraumatic, normocephalic. Oropharynx and nasopharynx clear.   NECK:  Supple, no jugular venous distention. No thyroid enlargement, no tenderness.  LUNGS: Normal breath sounds bilaterally, no wheezing, rales, rhonchi. No use of accessory muscles of respiration.  CARDIOVASCULAR: S1, S2 normal. No murmurs, rubs, or gallops.  ABDOMEN: Soft, nontender, nondistended. Bowel sounds present. No organomegaly or mass.  EXTREMITIES: No cyanosis, clubbing or edema b/l.    NEUROLOGIC: Cranial nerves II through XII are intact. No focal Motor or sensory deficits b/l.   PSYCHIATRIC:  patient is alert and oriented x 3.  SKIN: No obvious rash, lesion, or ulcer.   LABORATORY PANEL:  CBC Recent Labs  Lab 02/14/19 0505  WBC 6.3  HGB 10.4*  HCT 33.7*  PLT 189    Chemistries  Recent Labs  Lab 02/13/19 1014 02/14/19 0505  NA 138 141  K 5.5* 5.4*  CL 100 105  CO2 23 23  GLUCOSE 240* 195*  BUN 56* 65*  CREATININE 10.00* 11.18*  CALCIUM 8.7* 8.3*  MG  --  1.9  AST 17  --   ALT 17  --   ALKPHOS 119  --   BILITOT 0.6  --    Cardiac Enzymes No results for input(s): TROPONINI in the last 168 hours. RADIOLOGY:  No results found. ASSESSMENT AND PLAN:   68 y.o. female with pertinent past medical history ofCVA with residual effect, partial seizure, CAD, hypertension, ESRD on dialysis- T-TH-S, COPD, type 2 diabetes mellitus, with long-term current use of insulin, diastolic heart failure and hyperlipidemia presenting to the ED with chief complaints  Rectal bleed since Saturday?  1.  GI Bleed -patient presenting with hematochezia - IVF resuscitation to maintain MAP>65 -hemoglobin stable at 10.4 -Transfuse PRN Hgb<8 - Pantoprazole 80mg  IV x1 then  gtt 8mg /hr.  Plan to switch to pantoprazole 40 mg IV twice daily - GI Consult for EGD/Colonoscopy possible on Friday per GI as she needs to be off Plavix for 3 days - Hold NSAIDs, steroids, ASA  2.ESRD on dialysis- M-W-F.Missed dialysis yday -nephrology consultation placed for Dr. Zollie Scale to resume in-house  dialysis  3. Chronicdiastolic congestive Heart Failure: stable Last Echo6/2020 withLVEF55-60%  - continueMetoprolol  - Hold Torsemide.  - Low salt diet   4. Hyperkalemia - No EKG changes, pending HD K 5.4  5.Chronic COPD- patient with hx of COPDuses O2 as needed. No evidence of exacerbation - Supplemental O2, goal sat 88-92% -DuoNebs every 6 hours  6. Hxof CVA with residual aphasia -holding  dual therapy Aspirin 81 mg/day and Plavix 75 mg /day--for GI w/u  7.DM -Insulindependent at home -Start SSI - Diabetes educator to follow  8.  History of seizures -continue Keppra  Case discussed with Care Management/Social Worker. Management plans discussed with the patient.  CODE STATUS: full  DVT Prophylaxis: SCD  TOTAL TIME TAKING CARE OF THIS PATIENT: *30* minutes.  >50% time spent on counselling and coordination of care  POSSIBLE D/C IN *1-2* DAYS, DEPENDING ON CLINICAL CONDITION.  Note: This dictation was prepared with Dragon dictation along with smaller phrase technology. Any transcriptional errors that result from this process are unintentional.  Fritzi Mandes M.D on 02/14/2019 at 2:53 PM  Between 7am to 6pm - Pager - [phone]  After 6pm go to [email or web address] - Proofreader  [facility]  Office  [phone]  CC: Primary care physician; Lowella Bandy, MDPatient ID: Cynthia Dean, female   DOB: 04-30-1951, 68 y.o.   MRN: [id number]

## 2019-02-14 NOTE — Progress Notes (Signed)
Pt seen in HD for dialysis Tx this evening. Attempt to cannulate Pt x 4. Only able to get one functional needle. Pt states that she has had several stents placed along access and is difficult to cannulate. Holley Raring, MD notified of difficulty, will transfer back to impatient unit until further instructions received.

## 2019-02-14 NOTE — Progress Notes (Signed)
GI Inpatient Follow-up Note  Subjective:  Patient seen in follow-up for hematochezia and acute blood loss anemia. She was admitted yesterday and was hemodynamically stable with hemoglobin at 10.0. Hemoglobin this morning was 10.4. She denies any hematochezia since admission to the hospital. No new complaints or acute overnight events. She denies any nausea, vomiting, abdominal pain, or diarrhea. No BM since yesterday morning. She is tolerating a clear liquid diet w/o any difficulties. She reports plain is for her to have hemodialysis tonight.  Scheduled Inpatient Medications:  . amLODipine  5 mg Oral BID  . atorvastatin  80 mg Oral QPM  . Chlorhexidine Gluconate Cloth  6 each Topical Q0600  . gabapentin  100 mg Oral TID  . insulin aspart  0-15 Units Subcutaneous TID WC  . insulin aspart  0-5 Units Subcutaneous QHS  . insulin detemir  5 Units Subcutaneous BID  . levETIRAcetam  1,000 mg Oral Daily  . levETIRAcetam  250 mg Oral Q M,W,F-2000  . metoprolol succinate  100 mg Oral Daily  . polyethylene glycol-electrolytes  4,000 mL Oral Once    Continuous Inpatient Infusions:   . pantoprozole (PROTONIX) infusion 8 mg/hr (02/14/19 0657)    PRN Inpatient Medications:  acetaminophen, hydrALAZINE  Review of Systems: Constitutional: Weight is stable.  Eyes: No changes in vision. ENT: No oral lesions, sore throat.  GI: see HPI.  Heme/Lymph: No easy bruising.  CV: No chest pain.  GU: No hematuria.  Integumentary: No rashes.  Neuro: No headaches.  Psych: No depression/anxiety.  Endocrine: No heat/cold intolerance.  Allergic/Immunologic: No urticaria.  Resp: No cough, SOB.  Musculoskeletal: No joint swelling.    Physical Examination: BP (!) 168/68   Pulse 69   Temp 97.7 F (36.5 C) (Oral)   Resp 20   Ht 5\' 9"  (1.753 m)   Wt 122.5 kg   SpO2 93%   BMI 39.87 kg/m  Gen: NAD, alert and oriented x 4 HEENT: PEERLA, EOMI, Neck: supple, no JVD or thyromegaly Chest: CTA bilaterally,  no wheezes, crackles, or other adventitious sounds CV: RRR, no m/g/c/r Abd: soft, NT, ND, +BS in all four quadrants; no HSM, guarding, ridigity, or rebound tenderness Ext: no edema, well perfused with 2+ pulses, Skin: no rash or lesions noted Lymph: no LAD  Data: Lab Results  Component Value Date   WBC 6.3 02/14/2019   HGB 10.4 (L) 02/14/2019   HCT 33.7 (L) 02/14/2019   MCV 92.3 02/14/2019   PLT 189 02/14/2019   Recent Labs  Lab 02/13/19 1014 02/14/19 0505  HGB 10.0* 10.4*   Lab Results  Component Value Date   NA 141 02/14/2019   K 5.4 (H) 02/14/2019   CL 105 02/14/2019   CO2 23 02/14/2019   BUN 65 (H) 02/14/2019   CREATININE 11.18 (H) 02/14/2019   Lab Results  Component Value Date   ALT 17 02/13/2019   AST 17 02/13/2019   ALKPHOS 119 02/13/2019   BILITOT 0.6 02/13/2019   No results for input(s): APTT, INR, PTT in the last 168 hours. Assessment/Plan:  68 y/o AA female with a PMH of ESRD on hemodialysis MWF, CAD, Hx of recurrent CVA, chronic antiplatelet therapy with Plavix and ASA, HTN, HLD, T2DM presented to the ED for hematochezia x 1 month  1. Hematochezia 2. Acute blood loss anemia 3. Multiple comorbidities - ESRD on HD, CAD, chronic antiplatelet therapy, COPD  - She is currently hemodynamically stable with Hgb 10.4 this morning with no evidence of active GI bleeding -  Continue clear liquid diet today - Trilyte bowel prep starting 1700 today - NPO after midnight - Plan for EGD and colonoscopy tomorrow morning with Dr. Alice Reichert - Continue to hold all antiplatelet therapy - Further recommendations after procedures     Please call with questions or concerns.    Octavia Bruckner, PA-C [facility] [phone] (Cell)

## 2019-02-15 ENCOUNTER — Encounter: Payer: Self-pay | Admitting: Anesthesiology

## 2019-02-15 ENCOUNTER — Encounter
Admission: EM | Disposition: A | Discharge status: Home / Self Care | Payer: Self-pay | Source: Home / Self Care | Attending: Internal Medicine

## 2019-02-15 LAB — GLUCOSE, CAPILLARY
Glucose-Capillary: 178 mg/dL — ABNORMAL HIGH (ref 70–99)
Glucose-Capillary: 142 mg/dL — ABNORMAL HIGH (ref 70–99)
Glucose-Capillary: 90 mg/dL (ref 70–99)
Glucose-Capillary: 140 mg/dL — ABNORMAL HIGH (ref 70–99)

## 2019-02-15 LAB — POTASSIUM: Potassium: 5.7 mmol/L — ABNORMAL HIGH (ref 3.5–5.1)

## 2019-02-15 SURGERY — ESOPHAGOGASTRODUODENOSCOPY (EGD) WITH PROPOFOL
Anesthesia: General

## 2019-02-15 MED ORDER — PEG 3350-KCL-NA BICARB-NACL 420 G PO SOLR
4000.0000 mL | Freq: Once | ORAL | Status: AC
  Administered 2019-02-15: 4000 mL via ORAL
  Filled 2019-02-15: qty 4000

## 2019-02-15 MED ORDER — PANTOPRAZOLE SODIUM 40 MG IV SOLR
40.0000 mg | Freq: Two times a day (BID) | INTRAVENOUS | Status: DC
  Administered 2019-02-15: 40 mg via INTRAVENOUS
  Filled 2019-02-15 (×2): qty 40

## 2019-02-15 NOTE — Progress Notes (Signed)
Pre HD Assessment:   02/15/19 1318  Neurological  Level of Consciousness Alert  Orientation Level Oriented X4  Respiratory  Respiratory Pattern Regular  Cardiac  Pulse Regular  Heart Sounds S1, S2  Vascular  R Radial Pulse +2  L Radial Pulse +2  Psychosocial  Psychosocial (WDL) WDL

## 2019-02-15 NOTE — TOC Initial Note (Signed)
Transition of Care [facility]) - Initial/Assessment Note    Patient Details  Name: Cynthia Dean MRN: [id number] Date of Birth: 12/06/1950  Transition of [facility]) CM/SW Contact:    [name], RN Phone Number: 02/15/2019, 2:30 PM  Clinical Narrative:                 RNCM to assess patient for high risk for readmission  Patient off the floor  Patient admitted with rectal bleed  Patient is chronic HD patient.  Elvera Bicker dialysis liaison notified of admission.    Per chart review patient lives at home with fiance.  Chronic O2 with [employer].  [name] Uses [facility] for transportation  Per nursing staff patient is independent.  Per chart review she has a WC in the home.   RNCM to complete high risk assessment   Expected Discharge Plan: Home/Self Care     Patient Goals and CMS Choice        Expected Discharge Plan and Services Expected Discharge Plan: Home/Self Care                                              Prior Living Arrangements/Services   Lives with:: Significant Other                   Activities of Daily Living Home Assistive Devices/Equipment: None ADL Screening (condition at time of admission) Patient's cognitive ability adequate to safely complete daily activities?: Yes Is the patient deaf or have difficulty hearing?: No Does the patient have difficulty seeing, even when wearing glasses/contacts?: No Does the patient have difficulty concentrating, remembering, or making decisions?: No Patient able to express need for assistance with ADLs?: Yes Does the patient have difficulty dressing or bathing?: No Independently performs ADLs?: Yes (appropriate for developmental age) Does the patient have difficulty walking or climbing stairs?: No Weakness of Legs: None Weakness of Arms/Hands: None  Permission Sought/Granted                  Emotional Assessment              Admission diagnosis:  Bright red blood per  rectum [K62.5] Patient Active Problem List   Diagnosis Date Noted  . Rectal bleed 02/13/2019  . Acute respiratory failure with hypoxia ([place]) 12/31/2018  . Nausea & vomiting 05/30/2018  . Nausea and vomiting 05/29/2018  . Partial seizure ([place]) 03/14/2018  . Stroke ([place]) 03/07/2018  . Slurred speech 11/14/2017  . Diabetes ([place]) 11/12/2017  . CAD (coronary artery disease) 11/12/2017  . COPD (chronic obstructive pulmonary disease) ([place]) 11/12/2017  . ESRD on dialysis ([place]) 11/12/2017  . Hematemesis 06/27/2017  . CVA (cerebral vascular accident) ([place]) 04/14/2016  . Essential hypertension, benign 11/27/2015  . Hyperlipidemia 11/27/2015  . Obesity 11/27/2015  . Prolonged Q-T interval on ECG 11/26/2015  . Intractable nausea and vomiting 08/08/2015   PCP:  Lowella Bandy, MD Pharmacy:   [facility] [address], [place] [address] Phone: [phone] Fax: [phone]     Social Determinants of Health (SDOH) Interventions    Readmission Risk Interventions Readmission Risk Prevention Plan 02/15/2019 01/02/2019  Transportation Screening - Complete  PCP or Specialist Appt within 3-5 Days - Complete  Palliative Care Screening - Not Applicable  Medication Review (RN Care Manager) Complete -  Some recent data might be hidden

## 2019-02-15 NOTE — Progress Notes (Signed)
HD Initiated:    02/15/19 1345  Vital Signs  Temp 97.7 F (36.5 C)  Temp Source Oral  Pulse Rate 70  Pulse Rate Source Monitor  Resp 14  BP (!) 168/77  BP Location Right Arm  BP Method Automatic  Patient Position (if appropriate) Lying  Oxygen Therapy  SpO2 95 %  O2 Device Room Air  Pain Assessment  Pain Scale 0-10  Pain Score 0  During Hemodialysis Assessment  Blood Flow Rate (mL/min) 400 mL/min  Arterial Pressure (mmHg) -70 mmHg  Venous Pressure (mmHg) 160 mmHg  Transmembrane Pressure (mmHg) 70 mmHg  Ultrafiltration Rate (mL/min) 720 mL/min  Dialysate Flow Rate (mL/min) 800 ml/min  Conductivity: Machine  14.1  HD Safety Checks Performed Yes  Intra-Hemodialysis Comments Tx initiated

## 2019-02-15 NOTE — Progress Notes (Signed)
Message sent to Dr. Alice Reichert informing him  That patient drank 80% of bowel prep, reported she 'could not and would not drink any more". Stool was not completely clear at last movement.

## 2019-02-15 NOTE — Progress Notes (Signed)
[facility] NAME: Cynthia Dean    MR#:  [id number]  DATE OF BIRTH:  [birth year]  SUBJECTIVE:  patient came in with rectal bleed. Denies any vomiting. No abdominal pain.  Patient had bowel prep yesterday. No rectal bleed reported.  Patient was not able to get dialysis yesterday due to her access having stents and unable to initiate HD. Her last treatment was on Monday  REVIEW OF SYSTEMS:   Review of Systems  Constitutional: Negative for chills, fever and weight loss.  HENT: Negative for ear discharge, ear pain and nosebleeds.   Eyes: Negative for blurred vision, pain and discharge.  Respiratory: Negative for sputum production, shortness of breath, wheezing and stridor.   Cardiovascular: Negative for chest pain, palpitations, orthopnea and PND.  Gastrointestinal: Positive for blood in stool. Negative for abdominal pain, diarrhea, nausea and vomiting.  Genitourinary: Negative for frequency and urgency.  Musculoskeletal: Negative for back pain and joint pain.  Neurological: Positive for weakness. Negative for sensory change, speech change and focal weakness.  Psychiatric/Behavioral: Negative for depression and hallucinations. The patient is not nervous/anxious.    Tolerating Diet: npo Tolerating PT: pending  DRUG ALLERGIES:   Allergies  Allergen Reactions  . Hydrocodone     Intolerant more than allergic  . Tape Itching    Skin Dermatitis/itching (tape adhesive) Skin Dermatitis/itching (tape adhesive)  . Tapentadol Itching    Skin Dermatitis/itching (tape adhesive) Skin Dermatitis/itching (tape adhesive)     VITALS:  Blood pressure (!) 146/68, pulse 67, temperature 97.7 F (36.5 C), temperature source Oral, resp. rate 18, height 5\' 9"  (1.753 m), weight 122.5 kg, SpO2 99 %.  PHYSICAL EXAMINATION:   Physical Exam  GENERAL:  68 y.o.-year-old patient lying in the bed with no acute distress. obese EYES: Pupils  equal, round, reactive to light and accommodation. No scleral icterus. Extraocular muscles intact.  HEENT: Head atraumatic, normocephalic. Oropharynx and nasopharynx clear.  NECK:  Supple, no jugular venous distention. No thyroid enlargement, no tenderness.  LUNGS: Normal breath sounds bilaterally, no wheezing, rales, rhonchi. No use of accessory muscles of respiration.  CARDIOVASCULAR: S1, S2 normal. No murmurs, rubs, or gallops.  ABDOMEN: Soft, nontender, nondistended. Bowel sounds present. No organomegaly or mass.  EXTREMITIES: No cyanosis, clubbing or edema b/l.    NEUROLOGIC: Cranial nerves II through XII are intact. No focal Motor or sensory deficits b/l.   PSYCHIATRIC:  patient is alert and oriented x 3.  SKIN: No obvious rash, lesion, or ulcer.   LABORATORY PANEL:  CBC Recent Labs  Lab 02/14/19 0505  WBC 6.3  HGB 10.4*  HCT 33.7*  PLT 189    Chemistries  Recent Labs  Lab 02/13/19 1014 02/14/19 0505  NA 138 141  K 5.5* 5.4*  CL 100 105  CO2 23 23  GLUCOSE 240* 195*  BUN 56* 65*  CREATININE 10.00* 11.18*  CALCIUM 8.7* 8.3*  MG  --  1.9  AST 17  --   ALT 17  --   ALKPHOS 119  --   BILITOT 0.6  --    Cardiac Enzymes No results for input(s): TROPONINI in the last 168 hours. RADIOLOGY:  No results found. ASSESSMENT AND PLAN:   68 y.o. female with pertinent past medical history ofCVA with residual effect, partial seizure, CAD, hypertension, ESRD on dialysis- T-TH-S, COPD, type 2 diabetes mellitus, with long-term current use of insulin, diastolic heart failure and hyperlipidemia presenting to the ED with chief complaints  Rectal bleed since Saturday?  1. GI Bleed -patient presenting with hematochezia -hemoglobin stable at 10.4 -Transfuse PRN Hgb<8 - Pantoprazole 80mg  IV x1 then gtt 8mg /hr.  Plan to switch to pantoprazole 40 mg IV twice daily - GI Consult for EGD/Colonoscopy today per GI as she needs to be off Plavix for 3 days - Hold NSAIDs, steroids,  ASA  2.ESRD on dialysis- M-W-F. -nephrology consultation placed for Dr. Zollie Scale to resume in-house dialysis -patient's last dialysis was on Monday. She was tried yesterday however since her HD Access her stance nurse was not able to access it. -Patient will get dialysis today -stat potassium draw this morning  3. Chronicdiastolic congestive Heart Failure: stable Last Echo6/2020 withLVEF55-60%  - continueMetoprolol  - Hold Torsemide.  - Low salt diet   4. Hyperkalemia - No EKG changes, pending HD K 5.4 -Potassium this morning  5.Chronic COPD- patient with hx of COPDuses O2 as needed. No evidence of exacerbation - Supplemental O2, goal sat 88-92% -DuoNebs every 6 hours  6. Hxof CVA with residual aphasia -holding  dual therapy Aspirin 81 mg/day and Plavix 75 mg /day--for GI w/u  7.DM -Insulindependent at home -Start SSI - Diabetes educator to follow  8.  History of seizures -continue Keppra  Case discussed with Care Management/Social Worker. Management plans discussed with the patient.  CODE STATUS: full  DVT Prophylaxis: SCD  TOTAL TIME TAKING CARE OF THIS PATIENT: *30* minutes.  >50% time spent on counselling and coordination of care  POSSIBLE D/C IN *1-2* DAYS, DEPENDING ON CLINICAL CONDITION.  Note: This dictation was prepared with Dragon dictation along with smaller phrase technology. Any transcriptional errors that result from this process are unintentional.  Fritzi Mandes M.D on 02/15/2019 at 7:41 AM  Between 7am to 6pm - Pager - [phone]  After 6pm go to [email or web address] - Proofreader  [facility]  Office  [phone]  CC: Primary care physician; Lowella Bandy, MDPatient ID: Cynthia Dean, female   DOB: 1950/09/17, 68 y.o.   MRN: [id number]

## 2019-02-15 NOTE — Anesthesia Post-op Follow-up Note (Signed)
Anesthesia QCDR form completed.        

## 2019-02-15 NOTE — Progress Notes (Signed)
HD Tx Completed:   02/15/19 1645  Vital Signs  Temp 97.7 F (36.5 C)  Temp Source Oral  Pulse Rate 77  Pulse Rate Source Monitor  Resp 14  BP 127/64  BP Location Right Arm  BP Method Automatic  Patient Position (if appropriate) Lying  Oxygen Therapy  SpO2 97 %  O2 Device Room Air  Pain Assessment  Pain Scale 0-10  Pain Score 0  During Hemodialysis Assessment  Blood Flow Rate (mL/min) 400 mL/min  Arterial Pressure (mmHg) -160 mmHg  Venous Pressure (mmHg) 220 mmHg  Transmembrane Pressure (mmHg) 60 mmHg  Ultrafiltration Rate (mL/min) 720 mL/min  Dialysate Flow Rate (mL/min) 800 ml/min  Conductivity: Machine  14.1  HD Safety Checks Performed Yes  Intra-Hemodialysis Comments Tx completed

## 2019-02-15 NOTE — Progress Notes (Signed)
[facility]  ROUNDING NOTE   Subjective:  Patient has been cannulated by multiple nurses and thus far unsuccessful. Another dialysis nurse will be coming this afternoon to attempt cannulation. Due for EGD today.   Objective:  Vital signs in last 24 hours:  Temp:  [97.5 F (36.4 C)-98.2 F (36.8 C)] 97.5 F (36.4 C) (07/24 1205) Pulse Rate:  [62-67] 66 (07/24 1205) Resp:  [16-18] 18 (07/24 0441) BP: (146-178)/(68-76) 157/69 (07/24 1205) SpO2:  [95 %-100 %] 95 % (07/24 1205)  Weight change:  Filed Weights   02/13/19 1003  Weight: 122.5 kg    Intake/Output: I/O last 3 completed shifts: In: 2482 [P.O.:1710; I.V.:772] Out: 400 [Urine:400]   Intake/Output this shift:  No intake/output data recorded.  Physical Exam: General: No acute distress  Head: Normocephalic, atraumatic. Moist oral mucosal membranes  Eyes: Anicteric  Neck: Supple, trachea midline  Lungs:  Clear to auscultation, normal effort  Heart: S1S2 no rubs  Abdomen:  Soft, nontender, bowel sounds present  Extremities: No peripheral edema.  Neurologic: Awake, alert, following commands  Skin: No lesions  Access: Left upper extremity AV graft    Basic Metabolic Panel: Recent Labs  Lab 02/13/19 1014 02/14/19 0505 02/15/19 0823  NA 138 141  --   K 5.5* 5.4* 5.7*  CL 100 105  --   CO2 23 23  --   GLUCOSE 240* 195*  --   BUN 56* 65*  --   CREATININE 10.00* 11.18*  --   CALCIUM 8.7* 8.3*  --   MG  --  1.9  --     Liver Function Tests: Recent Labs  Lab 02/13/19 1014  AST 17  ALT 17  ALKPHOS 119  BILITOT 0.6  PROT 7.3  ALBUMIN 3.7   No results for input(s): LIPASE, AMYLASE in the last 168 hours. No results for input(s): AMMONIA in the last 168 hours.  CBC: Recent Labs  Lab 02/13/19 1014 02/14/19 0505  WBC 7.0 6.3  NEUTROABS  --  3.6  HGB 10.0* 10.4*  HCT 33.2* 33.7*  MCV 92.2 92.3  PLT 188 189    Cardiac Enzymes: No results for input(s): CKTOTAL, CKMB, CKMBINDEX,  TROPONINI in the last 168 hours.  BNP: Invalid input(s): POCBNP  CBG: Recent Labs  Lab 02/14/19 1131 02/14/19 1812 02/14/19 2106 02/15/19 0810 02/15/19 1155  GLUCAP 189* 145* 179* 178* 142*    Microbiology: Results for orders placed or performed during the hospital encounter of 02/13/19  SARS Coronavirus 2 (CEPHEID- Performed in [place] hospital lab), Hosp Order     Status: None   Collection Time: 02/13/19 12:41 PM   Specimen: Nasopharyngeal Swab  Result Value Ref Range Status   SARS Coronavirus 2 NEGATIVE NEGATIVE Final    Comment: (NOTE) If result is NEGATIVE SARS-CoV-2 target nucleic acids are NOT DETECTED. The SARS-CoV-2 RNA is generally detectable in upper and lower  respiratory specimens during the acute phase of infection. The lowest  concentration of SARS-CoV-2 viral copies this assay can detect is 250  copies / mL. A negative result does not preclude SARS-CoV-2 infection  and should not be used as the sole basis for treatment or other  patient management decisions.  A negative result may occur with  improper specimen collection / handling, submission of specimen other  than nasopharyngeal swab, presence of viral mutation(s) within the  areas targeted by this assay, and inadequate number of viral copies  (<250 copies / mL). A negative result must be combined  with clinical  observations, patient history, and epidemiological information. If result is POSITIVE SARS-CoV-2 target nucleic acids are DETECTED. The SARS-CoV-2 RNA is generally detectable in upper and lower  respiratory specimens dur ing the acute phase of infection.  Positive  results are indicative of active infection with SARS-CoV-2.  Clinical  correlation with patient history and other diagnostic information is  necessary to determine patient infection status.  Positive results do  not rule out bacterial infection or co-infection with other viruses. If result is PRESUMPTIVE POSTIVE SARS-CoV-2  nucleic acids MAY BE PRESENT.   A presumptive positive result was obtained on the submitted specimen  and confirmed on repeat testing.  While 2019 novel coronavirus  (SARS-CoV-2) nucleic acids may be present in the submitted sample  additional confirmatory testing may be necessary for epidemiological  and / or clinical management purposes  to differentiate between  SARS-CoV-2 and other Sarbecovirus currently known to infect humans.  If clinically indicated additional testing with an alternate test  methodology [id number]) is advised. The SARS-CoV-2 RNA is generally  detectable in upper and lower respiratory sp ecimens during the acute  phase of infection. The expected result is Negative. Fact Sheet for Patients:  StrictlyIdeas.no Fact Sheet for Healthcare Providers: BankingDealers.co.za This test is not yet approved or cleared by the [place] FDA and has been authorized for detection and/or diagnosis of SARS-CoV-2 by FDA under an Emergency Use Authorization (EUA).  This EUA will remain in effect (meaning this test can be used) for the duration of the COVID-19 declaration under Section 564(b)(1) of the Act, 21 U.S.C. section 360bbb-3(b)(1), unless the authorization is terminated or revoked sooner. Performed at [facility], [address] [place], [place] [address]   MRSA PCR Screening     Status: None   Collection Time: 02/13/19  5:15 PM   Specimen: Nasopharyngeal  Result Value Ref Range Status   MRSA by PCR NEGATIVE NEGATIVE Final    Comment:        The GeneXpert MRSA Assay (FDA approved for NASAL specimens only), is one component of a comprehensive MRSA colonization surveillance program. It is not intended to diagnose MRSA infection nor to guide or monitor treatment for MRSA infections. Performed at [facility], [address] [place],  [address]     Coagulation Studies: No results for  input(s): LABPROT, INR in the last 72 hours.  Urinalysis: No results for input(s): COLORURINE, LABSPEC, PHURINE, GLUCOSEU, HGBUR, BILIRUBINUR, KETONESUR, PROTEINUR, UROBILINOGEN, NITRITE, LEUKOCYTESUR in the last 72 hours.  Invalid input(s): APPERANCEUR    Imaging: No results found.   Medications:    . amLODipine  5 mg Oral BID  . atorvastatin  80 mg Oral QPM  . Chlorhexidine Gluconate Cloth  6 each Topical Q0600  . gabapentin  100 mg Oral TID  . insulin aspart  0-5 Units Subcutaneous QHS  . insulin aspart  0-9 Units Subcutaneous TID WC  . insulin detemir  5 Units Subcutaneous BID  . levETIRAcetam  1,000 mg Oral Daily  . levETIRAcetam  250 mg Oral Q M,W,F-2000  . metoprolol succinate  100 mg Oral Daily  . pantoprazole (PROTONIX) IV  40 mg Intravenous Q12H   acetaminophen, hydrALAZINE  Assessment/ Plan:  68 y.o. 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  [facility]/MWF/[facility] [place] [facility]/ 225 min  1.  ESRD on HD MWF.  Dialysis cannulation was attempted both yesterday and today and has been unsuccessful as she has multiple stents throughout her access.  Another nurse from 1 of our dialysis centers will be here later this afternoon and attempt cannulation.  2.  Anemia of chronic kidney disease.  Hemoglobin currently 10.4.  Hold off on Epogen for now.  3.  Secondary hyperparathyroidism.  Check serum phosphorus during dialysis treatment today.  4.  Hypertension.  Maintain the patient on amlodipine and metoprolol at the current doses.  5.  Hyperkalemia.  This should be treated with dialysis  today.   LOS: 2 Darric Plante 7/24/202012:10 PM

## 2019-02-15 NOTE — Progress Notes (Signed)
Post HD Tx Assessment:    02/15/19 1700  Neurological  Level of Consciousness Alert  Orientation Level Oriented X4  Respiratory  Respiratory Pattern Regular;Unlabored  Cardiac  Pulse Regular  Heart Sounds S1, S2  Vascular  R Radial Pulse +2  L Radial Pulse +2  Psychosocial  Psychosocial (WDL) WDL

## 2019-02-15 NOTE — Progress Notes (Signed)
Post HD Tx:    02/15/19 1700  Vital Signs  Temp 97.7 F (36.5 C)  Temp Source Oral  Pulse Rate 72  Pulse Rate Source Monitor  Resp 17  BP 135/63  Oxygen Therapy  SpO2 95 %  O2 Device Room Air  Pain Assessment  Pain Scale 0-10  Pain Score 0  Post-Hemodialysis Assessment  Rinseback Volume (mL) 250 mL  KECN 79.5 V  Dialyzer Clearance Lightly streaked  Duration of HD Treatment -hour(s) 3.5 hour(s)  Hemodialysis Intake (mL) 500 mL  UF Total -Machine (mL) 2500 mL  Net UF (mL) 2000 mL  Tolerated HD Treatment Yes  AVG/AVF Arterial Site Held (minutes)  (5 minutes)  AVG/AVF Venous Site Held (minutes)  (5 minutes)  Education / Care Plan  Dialysis Education Provided Yes  Documented Education in Care Plan Yes  Outpatient Plan of Care Reviewed and on Chart Yes  Fistula / Graft Left Forearm Arteriovenous fistula  No Placement Date or Time found.   Orientation: Left  Access Location: Forearm  Access Type: Arteriovenous fistula  Site Condition Other (Comment) (Difficulty cannulating)  Fistula / Graft Assessment Bruit;Thrill;Present  Status Deaccessed  Needle Size 15  Drainage Description None

## 2019-02-15 NOTE — Progress Notes (Signed)
Patient ID: Cynthia Dean, female   DOB: June 27, 1951, 68 y.o.   MRN: [id number] Spoke with dter Carmel [place] on the phone and updated

## 2019-02-15 NOTE — Care Management Important Message (Signed)
Important Message  Patient Details  Name: Cynthia Dean MRN: [id number] Date of Birth: [birth year]   Medicare Important Message Given:  Yes     [name] 02/15/2019, 12:36 PM

## 2019-02-15 NOTE — Progress Notes (Signed)
GI Inpatient Follow-up Note  Subjective:  Patient seen in f/u for hematochezia and acute blood loss anemia. She denies any acute events overnight or new complaints. She reports BM last night which was non-bloody and non-melanotic. She did not receive hemodialysis yesterday as they could not gain access after multiple attempts. She is currently receiving hemodialysis right now. She denies any abdominal pain, nausea, vomiting, dysphagia. She drank about 80% of her prep last night.   Scheduled Inpatient Medications:  . amLODipine  5 mg Oral BID  . atorvastatin  80 mg Oral QPM  . Chlorhexidine Gluconate Cloth  6 each Topical Q0600  . gabapentin  100 mg Oral TID  . insulin aspart  0-5 Units Subcutaneous QHS  . insulin aspart  0-9 Units Subcutaneous TID WC  . insulin detemir  5 Units Subcutaneous BID  . levETIRAcetam  1,000 mg Oral Daily  . levETIRAcetam  250 mg Oral Q M,W,F-2000  . metoprolol succinate  100 mg Oral Daily  . pantoprazole (PROTONIX) IV  40 mg Intravenous Q12H    Continuous Inpatient Infusions:    PRN Inpatient Medications:  acetaminophen, hydrALAZINE  Review of Systems: Constitutional: Weight is stable.  Eyes: No changes in vision. ENT: No oral lesions, sore throat.  GI: see HPI.  Heme/Lymph: No easy bruising.  CV: No chest pain.  GU: No hematuria.  Integumentary: No rashes.  Neuro: No headaches.  Psych: No depression/anxiety.  Endocrine: No heat/cold intolerance.  Allergic/Immunologic: No urticaria.  Resp: No cough, SOB.  Musculoskeletal: No joint swelling.    Physical Examination: BP (!) 157/69 (BP Location: Right Arm)   Pulse 66   Temp (!) 97.5 F (36.4 C) (Oral)   Resp 18   Ht 5\' 9"  (1.753 m)   Wt 122.5 kg   SpO2 95%   BMI 39.87 kg/m  Gen: NAD, alert and oriented x 4 HEENT: PEERLA, EOMI, Neck: supple, no JVD or thyromegaly Chest: CTA bilaterally, no wheezes, crackles, or other adventitious sounds CV: RRR, no m/g/c/r Abd: soft, NT, ND, +BS in all  four quadrants; no HSM, guarding, ridigity, or rebound tenderness Ext: no edema, well perfused with 2+ pulses, Skin: no rash or lesions noted Lymph: no LAD  Data: Lab Results  Component Value Date   WBC 6.3 02/14/2019   HGB 10.4 (L) 02/14/2019   HCT 33.7 (L) 02/14/2019   MCV 92.3 02/14/2019   PLT 189 02/14/2019   Recent Labs  Lab 02/13/19 1014 02/14/19 0505  HGB 10.0* 10.4*   Lab Results  Component Value Date   NA 141 02/14/2019   K 5.7 (H) 02/15/2019   CL 105 02/14/2019   CO2 23 02/14/2019   BUN 65 (H) 02/14/2019   CREATININE 11.18 (H) 02/14/2019   Lab Results  Component Value Date   ALT 17 02/13/2019   AST 17 02/13/2019   ALKPHOS 119 02/13/2019   BILITOT 0.6 02/13/2019   No results for input(s): APTT, INR, PTT in the last 168 hours. Assessment/Plan:  68 y/o AA female with a PMH of ESRD on hemodialysis MWF, CAD, Hx of recurrent CVA, chronic antiplatelet therapy with Plavix and ASA, HTN, HLD, T2DM presented to the ED for hematochezia x 1 month  1. Hematochezia 2. Acute blood loss anemia 3. Multiple comorbidities - ESRD on HD, CAD, chronic antiplatelet therapy, COPD  - No recurrent bleeding - She is receiving HD today - Plan for EGD and colonoscopy tomorrow morning with Dr. Alice Reichert - Hold all antiplatelet therapy - See procedure notes for  further details - Continue clear liquid diet. NPO after midnight. - Attempt to drink 1-2L of Trilyte prep tonight    Please call with questions or concerns.   Octavia Bruckner, PA-C [facility] [phone] (Cell)

## 2019-02-15 NOTE — Progress Notes (Signed)
Pre- HD TX:    02/15/19 1318  Time-Out for Hemodialysis  What Procedure? HD  Pt Identifiers(min of two) First/Last Name;MRN/Account#;Pt's DOB(use if MRN/Acct# not available  Correct Site? Yes  Correct Side? Yes  Correct Procedure? Yes  Consents Verified? Yes  Rad Studies Available? Yes  Safety Precautions Reviewed? Yes  Engineer, civil (consulting) Number 4  Station Number 4  UF/Alarm Test Passed  Conductivity: Meter 13.8  Conductivity: Machine  14.1  pH 7.6  Reverse Osmosis main  Normal Saline Lot Number [id number]  Dialyzer Lot Number [id number]  Disposable Set Lot Number [id number]  Machine Temperature 98.6 F (37 C)  Musician and Audible Yes  Blood Lines Intact and Secured Yes  Pre Treatment Patient Checks  Vascular access used during treatment Graft  Hepatitis B Surface Antigen Results Negative  Date Hepatitis B Surface Antigen Drawn 12/31/18  Isolation Initiated  (n/a)  Hepatitis B Surface Antibody  (>10)  Date Hepatitis B Surface Antibody Drawn 12/31/18  Hemodialysis Consent Verified Yes  Hemodialysis Standing Orders Initiated Yes  ECG (Telemetry) Monitor On Yes  Prime Ordered Normal Saline  Length of  DialysisTreatment -hour(s) 3.5 Hour(s)  Dialyzer Elisio 17H NR  Dialysate 2K;2.5 Ca  Dialysate Flow Ordered 800  Blood Flow Rate Ordered 400 mL/min  Ultrafiltration Goal 2 Liters  Dialysis Blood Pressure Support Ordered Normal Saline  Education / Care Plan  Dialysis Education Provided Yes  Documented Education in Care Plan Yes  Outpatient Plan of Care Reviewed and on Chart Yes  Fistula / Graft Left Forearm Arteriovenous fistula  No Placement Date or Time found.   Orientation: Left  Access Location: Forearm  Access Type: Arteriovenous fistula  Site Condition No complications  Fistula / Graft Assessment Bruit;Thrill;Present  Status Accessed  Drainage Description None

## 2019-02-16 ENCOUNTER — Inpatient Hospital Stay: Payer: Medicare HMO | Admitting: Certified Registered"

## 2019-02-16 ENCOUNTER — Encounter: Payer: Self-pay | Admitting: Anesthesiology

## 2019-02-16 ENCOUNTER — Encounter
Admission: EM | Disposition: A | Discharge status: Home / Self Care | Payer: Self-pay | Source: Home / Self Care | Attending: Internal Medicine

## 2019-02-16 HISTORY — PX: COLONOSCOPY WITH PROPOFOL: SHX5780

## 2019-02-16 HISTORY — PX: ESOPHAGOGASTRODUODENOSCOPY (EGD) WITH PROPOFOL: SHX5813

## 2019-02-16 LAB — BASIC METABOLIC PANEL
Anion gap: 11 (ref 5–15)
BUN: 32 mg/dL — ABNORMAL HIGH (ref 8–23)
CO2: 25 mmol/L (ref 22–32)
Calcium: 8.5 mg/dL — ABNORMAL LOW (ref 8.9–10.3)
Chloride: 101 mmol/L (ref 98–111)
Creatinine, Ser: 7.9 mg/dL — ABNORMAL HIGH (ref 0.44–1.00)
GFR calc Af Amer: 6 mL/min — ABNORMAL LOW (ref >60)
GFR calc non Af Amer: 5 mL/min — ABNORMAL LOW (ref >60)
Glucose, Bld: 147 mg/dL — ABNORMAL HIGH (ref 70–99)
Potassium: 5.1 mmol/L (ref 3.5–5.1)
Sodium: 137 mmol/L (ref 135–145)

## 2019-02-16 LAB — GLUCOSE, CAPILLARY
Glucose-Capillary: 132 mg/dL — ABNORMAL HIGH (ref 70–99)
Glucose-Capillary: 136 mg/dL — ABNORMAL HIGH (ref 70–99)
Glucose-Capillary: 131 mg/dL — ABNORMAL HIGH (ref 70–99)

## 2019-02-16 SURGERY — COLONOSCOPY WITH PROPOFOL
Anesthesia: General

## 2019-02-16 MED ORDER — ONDANSETRON HCL 4 MG/2ML IJ SOLN
INTRAMUSCULAR | Status: DC | PRN
  Administered 2019-02-16: 4 mg via INTRAVENOUS

## 2019-02-16 MED ORDER — ASPIRIN EC 81 MG PO TBEC: 81.0000 mg | DELAYED_RELEASE_TABLET | Freq: Every day | ORAL | 2 refills | Status: DC

## 2019-02-16 MED ORDER — PROPOFOL 500 MG/50ML IV EMUL
INTRAVENOUS | Status: DC | PRN
  Administered 2019-02-16: 200 ug/kg/min via INTRAVENOUS

## 2019-02-16 MED ORDER — SODIUM CHLORIDE 0.9 % IV SOLN
INTRAVENOUS | Status: DC | PRN
  Administered 2019-02-16: 08:00:00 via INTRAVENOUS

## 2019-02-16 MED ORDER — PROPOFOL 500 MG/50ML IV EMUL
INTRAVENOUS | Status: AC
  Filled 2019-02-16: qty 100

## 2019-02-16 MED ORDER — LIDOCAINE HCL (PF) 2 % IJ SOLN
INTRAMUSCULAR | Status: DC | PRN
  Administered 2019-02-16: 80 mg via INTRADERMAL

## 2019-02-16 MED ORDER — PROPOFOL 10 MG/ML IV BOLUS
INTRAVENOUS | Status: DC | PRN
  Administered 2019-02-16: 50 mg via INTRAVENOUS

## 2019-02-16 NOTE — Transfer of Care (Signed)
Immediate Anesthesia Transfer of Care Note  [name]  Procedure(s) Performed: COLONOSCOPY WITH PROPOFOL (N/A ) ESOPHAGOGASTRODUODENOSCOPY (EGD) WITH PROPOFOL (N/A )  Patient Location: PACU  Anesthesia Type:MAC  Level of Consciousness: awake, alert , oriented and patient cooperative  Airway & Oxygen Therapy: Patient Spontanous Breathing and Patient connected to nasal cannula oxygen  Post-op Assessment: Report given to RN and Post -op Vital signs reviewed and stable  Post vital signs: Reviewed and stable  Last Vitals:  Vitals Value Taken Time  BP    Temp    Pulse 91 02/16/19 0910  Resp 22 02/16/19 0910  SpO2 96 % 02/16/19 0910  Vitals shown include unvalidated device data.  Last Pain:  Vitals:   02/16/19 0801  TempSrc: Tympanic  PainSc: 0-No pain         Complications: No apparent anesthesia complications

## 2019-02-16 NOTE — Interval H&P Note (Signed)
History and Physical Interval Note:  02/16/2019 8:18 AM  Cynthia Dean  has presented today for surgery, with the diagnosis of GI BLEED.  The various methods of treatment have been discussed with the patient and family. After consideration of risks, benefits and other options for treatment, the patient has consented to  Procedure(s): COLONOSCOPY WITH PROPOFOL (N/A) ESOPHAGOGASTRODUODENOSCOPY (EGD) WITH PROPOFOL (N/A) as a surgical intervention.  The patient's history has been reviewed, patient examined, no change in status, stable for surgery.  I have reviewed the patient's chart and labs.  Questions were answered to the patient's satisfaction.     [place], [place]

## 2019-02-16 NOTE — Anesthesia Postprocedure Evaluation (Signed)
Anesthesia Post Note  Patient: Karrina Lye  Procedure(s) Performed: COLONOSCOPY WITH PROPOFOL (N/A ) ESOPHAGOGASTRODUODENOSCOPY (EGD) WITH PROPOFOL (N/A )  Patient location during evaluation: Endoscopy Anesthesia Type: General Level of consciousness: awake and alert Pain management: pain level controlled Vital Signs Assessment: post-procedure vital signs reviewed and stable Respiratory status: spontaneous breathing, nonlabored ventilation, respiratory function stable and patient connected to nasal cannula oxygen Cardiovascular status: blood pressure returned to baseline and stable Postop Assessment: no apparent nausea or vomiting Anesthetic complications: no     Last Vitals:  Vitals:   02/16/19 0801 02/16/19 0911  BP: (!) 151/96 (!) 131/51  Pulse: 71   Resp:    Temp: (!) 36.4 C (!) 36.3 C  SpO2: 93%     Last Pain:  Vitals:   02/16/19 0921  TempSrc:   PainSc: 0-No pain                 Precious Haws Jameeka Marcy

## 2019-02-16 NOTE — Anesthesia Preprocedure Evaluation (Signed)
Anesthesia Evaluation  Patient identified by MRN, date of birth, ID band Patient awake    Reviewed: Allergy & Precautions, H&P , NPO status , Patient's Chart, lab work & pertinent test results  History of Anesthesia Complications Negative for: history of anesthetic complications  Airway Mallampati: II  TM Distance: >3 FB Neck ROM: limited    Dental  (+) Poor Dentition, Chipped, Missing, Partial Lower   Pulmonary COPD,  oxygen dependent, former smoker,           Cardiovascular Exercise Tolerance: Good hypertension, (-) angina+ CAD  (-) Past MI and (-) DOE      Neuro/Psych Seizures -,  CVA, Residual Symptoms negative psych ROS   GI/Hepatic negative GI ROS, Neg liver ROS, neg GERD  ,  Endo/Other  diabetes, Type 2, Insulin Dependent  Renal/GU DialysisRenal disease     Musculoskeletal   Abdominal   Peds  Hematology negative hematology ROS (+)   Anesthesia Other Findings Past Medical History: No date: COPD (chronic obstructive pulmonary disease) (HCC) No date: Coronary artery disease No date: Diabetes mellitus without complication (HCC) No date: ESRD on dialysis ([place]) No date: Heart murmur No date: Hyperlipidemia No date: Hypertension No date: Stroke [facility])  Past Surgical History: 12/20/2016: A/V FISTULAGRAM; Left     Comment:  Procedure: A/V Fistulagram;  Surgeon: Katha Cabal, MD;  Location: [place] [facility];  Service:               Cardiovascular;  Laterality: Left; 12/20/2016: A/V SHUNT INTERVENTION; N/A     Comment:  Procedure: A/V Shunt Intervention;  Surgeon: Katha Cabal, MD;  Location: [place] [facility];  Service:              Cardiovascular;  Laterality: N/A; 09/11/2017: A/V SHUNTOGRAM; Left     Comment:  Procedure: A/V SHUNTOGRAM;  Surgeon: Algernon Huxley, MD;                Location: [place] [facility];  Service: Cardiovascular;              Laterality:  Left; 07/19/00: BREAST BIOPSY; Bilateral     Comment:  neg 11/16/2017: LOOP RECORDER INSERTION; N/A     Comment:  Procedure: LOOP RECORDER INSERTION;  Surgeon: Deboraha Sprang, MD;  Location: [place] [facility];  Service:               Cardiovascular;  Laterality: N/A; 02/02/2015: PERIPHERAL VASCULAR CATHETERIZATION; Left     Comment:  Procedure: A/V Shuntogram/Fistulagram;  Surgeon: Algernon Huxley, MD;  Location: [place] [facility];  Service:               Cardiovascular;  Laterality: Left; 02/02/2015: PERIPHERAL VASCULAR CATHETERIZATION; Left     Comment:  Procedure: A/V Shunt Intervention;  Surgeon: Algernon Huxley, MD;  Location: [place] [facility];  Service:               Cardiovascular;  Laterality: Left; 03/09/2015: PERIPHERAL VASCULAR CATHETERIZATION; Left     Comment:  Procedure: A/V Shuntogram/Fistulagram;  Surgeon: Erskine Squibb  Lucky Cowboy, MD;  Location: [place] [facility];  Service:               Cardiovascular;  Laterality: Left; 03/09/2015: PERIPHERAL VASCULAR CATHETERIZATION; N/A     Comment:  Procedure: A/V Shunt Intervention;  Surgeon: Algernon Huxley, MD;  Location: [place] [facility];  Service:               Cardiovascular;  Laterality: N/A; 04/17/2015: PERIPHERAL VASCULAR CATHETERIZATION; Left     Comment:  Procedure: Upper Extremity Angiography;  Surgeon: Algernon Huxley, MD;  Location: [place] [facility];  Service:               Cardiovascular;  Laterality: Left; 04/17/2015: PERIPHERAL VASCULAR CATHETERIZATION     Comment:  Procedure: Upper Extremity Intervention;  Surgeon: Algernon Huxley, MD;  Location: [place] [facility];  Service:               Cardiovascular;; 08/10/2015: PERIPHERAL VASCULAR CATHETERIZATION; N/A     Comment:  Procedure: A/V Shuntogram/Fistulagram;  Surgeon: Algernon Huxley, MD;  Location: [place] [facility];  Service:               Cardiovascular;  Laterality:  N/A; 08/10/2015: PERIPHERAL VASCULAR CATHETERIZATION; N/A     Comment:  Procedure: A/V Shunt Intervention;  Surgeon: Algernon Huxley, MD;  Location: [place] [facility];  Service:               Cardiovascular;  Laterality: N/A; 04/11/2016: PERIPHERAL VASCULAR CATHETERIZATION; Left     Comment:  Procedure: A/V Shuntogram/Fistulagram;  Surgeon: Algernon Huxley, MD;  Location: [place] [facility];  Service:               Cardiovascular;  Laterality: Left; 11/16/2017: TEE WITHOUT CARDIOVERSION; N/A     Comment:  Procedure: TRANSESOPHAGEAL ECHOCARDIOGRAM (TEE);                Surgeon: Minna Merritts, MD;  Location: [facility];                Service: Cardiovascular;  Laterality: N/A;  BMI    Body Mass Index: 39.87 kg/m      Reproductive/Obstetrics negative OB ROS                             Anesthesia Physical Anesthesia Plan  ASA: IV  Anesthesia Plan: General   Post-op Pain Management:    Induction: Intravenous  PONV Risk Score and Plan: Dexamethasone, Ondansetron, Midazolam and Treatment may vary due to age or medical condition  Airway Management Planned: Natural Airway and Nasal Cannula  Additional Equipment:   Intra-op Plan:   Post-operative Plan:   Informed Consent: I have reviewed the patients History and Physical, chart, labs and discussed the procedure including the risks, benefits and alternatives for the proposed anesthesia with the patient or authorized representative who has indicated his/her understanding and acceptance.     Dental Advisory Given  Plan Discussed with: Anesthesiologist, CRNA and Surgeon  Anesthesia Plan Comments: (Patient consented for risks of anesthesia including but not limited to:  - adverse reactions to medications - risk of intubation if required - damage to teeth, lips or other oral mucosa - sore throat or hoarseness - Damage to heart, brain, lungs or loss of life  Patient voiced  understanding.)        Anesthesia Quick Evaluation

## 2019-02-16 NOTE — Op Note (Signed)
[facility] Patient Name: Cynthia Dean Procedure Date: 02/16/2019 8:31 AM MRN: [id number] Account #: [id number] Date of Birth: [birth year] Admit Type: Outpatient Age: 68 Room: [facility] ENDO ROOM 4 Gender: Female Note Status: Finalized Procedure:            Colonoscopy Indications:          Hematochezia, Melena, Acute post hemorrhagic anemia Providers:            Benay Pike. Jaslynn Thome MD, MD Medicines:            Propofol per Anesthesia Complications:        No immediate complications. Procedure:            Pre-Anesthesia Assessment:                       - The risks and benefits of the procedure and the                        sedation options and risks were discussed with the                        patient. All questions were answered and informed                        consent was obtained.                       - Patient identification and proposed procedure were                        verified prior to the procedure by the nurse. The                        procedure was verified in the procedure room.                       - ASA Grade Assessment: III - A patient with severe                        systemic disease.                       - After reviewing the risks and benefits, the patient                        was deemed in satisfactory condition to undergo the                        procedure.                       After obtaining informed consent, the colonoscope was                        passed under direct vision. Throughout the procedure,                        the patient's blood pressure, pulse, and oxygen                        saturations were monitored continuously. The  Colonoscope was introduced through the anus and                        advanced to the the terminal ileum, with identification                        of the appendiceal orifice and IC valve. The                        colonoscopy was somewhat  difficult due to a tortuous                        colon. Successful completion of the procedure was aided                        by withdrawing and reinserting the scope. The patient                        tolerated the procedure well. The quality of the bowel                        preparation was excellent. Findings:      The perianal and digital rectal examinations were normal. Pertinent       negatives include normal sphincter tone and no palpable rectal lesions.      A localized area of mildly erythematous mucosa was found in the proximal       ascending colon. Biopsies were taken with a cold forceps for histology.      The terminal ileum appeared normal.      The exam was otherwise without abnormality on direct and retroflexion       views.      There is no endoscopic evidence of bleeding, diverticula, mass or polyps       in the entire colon.      The exam was otherwise without abnormality on direct and retroflexion       views. Impression:           - Erythematous mucosa in the proximal ascending colon.                        Biopsied.                       - The examined portion of the ileum was normal.                       - The examination was otherwise normal on direct and                        retroflexion views.                       - The examination was otherwise normal on direct and                        retroflexion views. Recommendation:       - Return patient to hospital ward for possible                        discharge same day.                       -  Advance diet as tolerated.                       - Continue present medications.                       - Return to GI office PRN. Procedure Code(s):    --- Professional ---                       [phone], Colonoscopy, flexible; with biopsy, single or                        multiple Diagnosis Code(s):    --- Professional ---                       D62, Acute posthemorrhagic anemia                       K92.1,  Melena (includes Hematochezia)                       K63.89, Other specified diseases of intestine CPT copyright 2019 American Medical Association. All rights reserved. The codes documented in this report are preliminary and upon coder review may  be revised to meet current compliance requirements. Efrain Sella MD, MD 02/16/2019 9:06:51 AM This report has been signed electronically. Number of Addenda: 0 Note Initiated On: 02/16/2019 8:31 AM Scope Withdrawal Time: 0 hours 8 minutes 37 seconds  Total Procedure Duration: 0 hours 18 minutes 13 seconds  Estimated Blood Loss: Estimated blood loss: none.      [facility]

## 2019-02-16 NOTE — Progress Notes (Signed)
[place] [facility] [place] NAME: Cynthia Dean    MR#:  [id number]  DATE OF BIRTH:  September 26, 1950  SUBJECTIVE:  CHIEF COMPLAINT:   Chief Complaint  Patient presents with  . GI Bleeding   - no further GI bleeding - s/p EGD and colonoscopy today  REVIEW OF SYSTEMS:  Review of Systems  Constitutional: Negative for chills, fever and malaise/fatigue.  HENT: Negative for ear discharge, hearing loss and nosebleeds.   Eyes: Negative for blurred vision and double vision.  Respiratory: Negative for cough, shortness of breath and wheezing.   Cardiovascular: Negative for chest pain, palpitations and leg swelling.  Gastrointestinal: Positive for blood in stool. Negative for abdominal pain, constipation, diarrhea, nausea and vomiting.  Genitourinary: Negative for dysuria.  Musculoskeletal: Negative for myalgias.  Neurological: Negative for dizziness, focal weakness, seizures, weakness and headaches.  Psychiatric/Behavioral: Negative for depression.    DRUG ALLERGIES:   Allergies  Allergen Reactions  . Hydrocodone     Intolerant more than allergic  . Tape Itching    Skin Dermatitis/itching (tape adhesive) Skin Dermatitis/itching (tape adhesive)  . Tapentadol Itching    Skin Dermatitis/itching (tape adhesive) Skin Dermatitis/itching (tape adhesive)     VITALS:  Blood pressure (!) 131/51, pulse 71, temperature (!) 97.3 F (36.3 C), temperature source Tympanic, resp. rate 20, height 5\' 9"  (1.753 m), weight 122.5 kg, SpO2 93 %.  PHYSICAL EXAMINATION:  Physical Exam   GENERAL:  68 y.o.-year-old obese patient lying in the bed with no acute distress.  EYES: Pupils equal, round, reactive to light and accommodation. No scleral icterus. Extraocular muscles intact.  HEENT: Head atraumatic, normocephalic. Oropharynx and nasopharynx clear.  NECK:  Supple, no jugular venous distention. No thyroid enlargement, no tenderness.  LUNGS: Normal breath sounds  bilaterally, no wheezing, rales,rhonchi or crepitation. No use of accessory muscles of respiration. Decreased bibasilar breath sounds CARDIOVASCULAR: S1, S2 normal. No  rubs, or gallops. 2/6 systolic murmur present ABDOMEN: Soft, nontender, nondistended. Bowel sounds present. No organomegaly or mass.  EXTREMITIES: No pedal edema, cyanosis, or clubbing.  Left upper extremity AV graft NEUROLOGIC: Cranial nerves II through XII are intact. Muscle strength 5/5 in all extremities. Sensation intact. Gait not checked.  PSYCHIATRIC: The patient is alert and oriented x 3.  SKIN: No obvious rash, lesion, or ulcer.    LABORATORY PANEL:   CBC Recent Labs  Lab 02/14/19 0505  WBC 6.3  HGB 10.4*  HCT 33.7*  PLT 189   ------------------------------------------------------------------------------------------------------------------  Chemistries  Recent Labs  Lab 02/13/19 1014 02/14/19 0505  02/16/19 0723  NA 138 141  --  137  K 5.5* 5.4*   < > 5.1  CL 100 105  --  101  CO2 23 23  --  25  GLUCOSE 240* 195*  --  147*  BUN 56* 65*  --  32*  CREATININE 10.00* 11.18*  --  7.90*  CALCIUM 8.7* 8.3*  --  8.5*  MG  --  1.9  --   --   AST 17  --   --   --   ALT 17  --   --   --   ALKPHOS 119  --   --   --   BILITOT 0.6  --   --   --    < > = values in this interval not displayed.   ------------------------------------------------------------------------------------------------------------------  Cardiac Enzymes No results for input(s): TROPONINI in the last 168 hours. ------------------------------------------------------------------------------------------------------------------  RADIOLOGY:  No results found.  EKG:   Orders placed or performed during the hospital encounter of 12/31/18  . EKG 12-Lead  . EKG 12-Lead  . ED EKG  . ED EKG    ASSESSMENT AND PLAN:   68 year old female with past medical history significant for end-stage renal disease on Monday Wednesday Friday  hemodialysis, CAD, COPD, peripheral vascular disease, hypertension, history of stroke, diabetes presents to hospital secondary to rectal bleed.  1.  Bright red blood per rectum-patient on aspirin and Plavix at home. -Her hemoglobin has remained stable through the hospital course.  She has not received any transfusions -Appreciate GI consult.  She had EGD and colonoscopy done which showed only mild gastritis and some colitis cannot rule out ischemic in nature. -Since no active bleeding and stable hemoglobin-recommended outpatient follow-up -We will resume her aspirin and Plavix at discharge  2.  End-stage renal disease on hemodialysis-Monday Wednesday Friday schedule. -Seen by nephrology in the hospital and has had dialysis per schedule here.  Initial trouble accessing her AV graft but was successful  3.  Chronic diastolic heart failure-last echo with EF of 55 to 60% from June 2020 -Continue low-salt diet, metoprolol and torsemide  4.  History of stroke-restarted aspirin and Plavix at discharge  5.  History of seizures-Keppra  6.  Diabetes mellitus-resume home insulin at discharge.  7.  DVT prophylaxis-teds and SCDs here  Feels stable and very eager to be discharged today    All the records are reviewed and case discussed with Care Management/Social Workerr. Management plans discussed with the patient, family and they are in agreement.  CODE STATUS: Full code  TOTAL TIME TAKING CARE OF THIS PATIENT: 38 minutes.   POSSIBLE D/C TODAY, DEPENDING ON CLINICAL CONDITION.   Gladstone Lighter M.D on 02/16/2019 at 12:42 PM  Between 7am to 6pm - Pager - [phone]  After 6pm go to [email or web address] - password [email or web address] [facility]  Office  [phone]  CC: Primary care physician; Lowella Bandy, MD

## 2019-02-16 NOTE — Progress Notes (Signed)
Pt stated that she was told by (she) the MD that she was to drink only two cups of the prep. The MD was called and he informed the nurse that the Pt had to drink at least 1 L of the prep as stool  need to be clear. Pt was informed of this and she insisted that she was only going to drink 2 cups as she was told to do. The Pt was informed that this may hinder hinder her from getting the procedure this morning and she noted that she was OK with that because she was not drinking any more of the prep.Pt noted that she did have several BMs and that they were clear. The nurse was only aware of one instance.

## 2019-02-16 NOTE — Progress Notes (Signed)
Cynthia Dean to be D/C'd home per MD order.  Discussed prescriptions and follow up appointments with the patient. Prescriptions given to patient, medication list explained in detail. Pt verbalized understanding.  Allergies as of 02/16/2019      Reactions   Hydrocodone    Intolerant more than allergic   Tape Itching   Skin Dermatitis/itching (tape adhesive) Skin Dermatitis/itching (tape adhesive)   Tapentadol Itching   Skin Dermatitis/itching (tape adhesive) Skin Dermatitis/itching (tape adhesive)      Medication List    TAKE these medications   acetaminophen 325 MG tablet Commonly known as: TYLENOL Take 2 tablets (650 mg total) by mouth every 4 (four) hours as needed for mild pain (or temp > 37.5 C (99.5 F)).   amLODipine 5 MG tablet Commonly known as: NORVASC Take 5 mg by mouth 2 (two) times daily.   aspirin EC 81 MG tablet Take 1 tablet (81 mg total) by mouth daily. What changed:   medication strength  how much to take   atorvastatin 80 MG tablet Commonly known as: LIPITOR Take 80 mg by mouth every evening.   calcium acetate 667 MG capsule Commonly known as: PHOSLO Take 2,001 mg by mouth 3 (three) times daily.   clopidogrel 75 MG tablet Commonly known as: PLAVIX TAKE ONE TABLET BY MOUTH ONCE DAILY   gabapentin 100 MG capsule Commonly known as: NEURONTIN Take 100 mg by mouth 3 (three) times daily.   insulin NPH-regular Human (70-30) 100 UNIT/ML injection Inject 25 Units into the skin 2 (two) times daily with a meal. Except dialysis days. Do not take on Tuesdays, Thursdays, and Saturdays.   levETIRAcetam 250 MG tablet Commonly known as: KEPPRA Take 1 tablet (250 mg total) by mouth 3 (three) times a week. Take after dialysis on Monday, wed and Friday in addition to regular dose.   levETIRAcetam 1000 MG tablet Commonly known as: KEPPRA Take 1 tablet (1,000 mg total) by mouth daily.   lidocaine-prilocaine cream Commonly known as: EMLA Apply 1  application topically as needed (for port access).   losartan 100 MG tablet Commonly known as: COZAAR Take 100 mg by mouth at bedtime. Takes in the morning   metoprolol succinate 100 MG 24 hr tablet Commonly known as: TOPROL-XL Take 100 mg by mouth daily. Take with or immediately following a meal.   omeprazole 20 MG capsule Commonly known as: PRILOSEC Take 20 mg by mouth 2 (two) times daily.   torsemide 100 MG tablet Commonly known as: DEMADEX Take 100 mg by mouth daily.       Vitals:   02/16/19 0911 02/16/19 1335  BP: (!) 131/51 125/74  Pulse:  82  Resp:  18  Temp: (!) 97.3 F (36.3 C) 98.9 F (37.2 C)  SpO2:  94%    Skin clean, dry and intact without evidence of skin break down, no evidence of skin tears noted. IV catheter discontinued intact. Site without signs and symptoms of complications. Dressing and pressure applied. Pt denies pain at this time. No complaints noted.  An After Visit Summary was printed and given to the patient. Patient escorted via [place], and D/C home via private auto.  Chuck Hint RN [facility] [employer]

## 2019-02-16 NOTE — Op Note (Signed)
[facility] Patient Name: Cynthia Dean Procedure Date: 02/16/2019 8:31 AM MRN: [id number] Account #: [id number] Date of Birth: 19-Apr-1951 Admit Type: Outpatient Age: 68 Room: [facility] ENDO ROOM 4 Gender: Female Note Status: Finalized Procedure:            Upper GI endoscopy Indications:          Acute post hemorrhagic anemia, Melena Providers:            Benay Pike. [place] MD, MD Medicines:            Propofol per Anesthesia Complications:        No immediate complications. Procedure:            Pre-Anesthesia Assessment:                       - The risks and benefits of the procedure and the                        sedation options and risks were discussed with the                        patient. All questions were answered and informed                        consent was obtained.                       - Patient identification and proposed procedure were                        verified prior to the procedure by the nurse. The                        procedure was verified in the procedure room.                       - ASA Grade Assessment: III - A patient with severe                        systemic disease.                       - After reviewing the risks and benefits, the patient                        was deemed in satisfactory condition to undergo the                        procedure.                       After obtaining informed consent, the endoscope was                        passed under direct vision. Throughout the procedure,                        the patient's blood pressure, pulse, and oxygen                        saturations were monitored continuously. The Endoscope  was introduced through the mouth, and advanced to the                        third part of duodenum. The upper GI endoscopy was                        accomplished without difficulty. The patient tolerated                        the procedure  well. Findings:      A widely patent Schatzki ring was found in the distal esophagus.      Normal mucosa was found in the entire esophagus.      A 1 cm hiatal hernia was present.      Localized mild inflammation characterized by congestion (edema) and       erythema was found in the gastric body.      Patchy mildly erythematous mucosa without active bleeding and with no       stigmata of bleeding was found in the duodenal bulb.      The second portion of the duodenum and third portion of the duodenum       were normal.      The exam was otherwise without abnormality. Impression:           - Widely patent Schatzki ring.                       - Normal mucosa was found in the entire esophagus.                       - 1 cm hiatal hernia.                       - Gastritis.                       - Erythematous duodenopathy.                       - Normal second portion of the duodenum and third                        portion of the duodenum.                       - The examination was otherwise normal.                       - No specimens collected. Recommendation:       - Proceed with colonoscopy Procedure Code(s):    --- Professional ---                       [phone], Esophagogastroduodenoscopy, flexible, transoral;                        diagnostic, including collection of specimen(s) by                        brushing or washing, when performed (separate procedure) Diagnosis Code(s):    --- Professional ---  K92.1, Melena (includes Hematochezia)                       D62, Acute posthemorrhagic anemia                       K31.89, Other diseases of stomach and duodenum                       K29.70, Gastritis, unspecified, without bleeding                       K44.9, Diaphragmatic hernia without obstruction or                        gangrene                       K22.2, Esophageal obstruction CPT copyright 2019 American Medical Association. All rights reserved. The  codes documented in this report are preliminary and upon coder review may  be revised to meet current compliance requirements. Efrain Sella MD, MD 02/16/2019 8:41:14 AM This report has been signed electronically. Number of Addenda: 0 Note Initiated On: 02/16/2019 8:31 AM Estimated Blood Loss: Estimated blood loss: none.      [facility]

## 2019-02-16 NOTE — Anesthesia Post-op Follow-up Note (Signed)
Anesthesia QCDR form completed.        

## 2019-02-17 NOTE — Discharge Summary (Signed)
[place] [facility] [place] NAME: Cynthia Dean    MR#:  [id number]  DATE OF BIRTH:  05/31/1951  DATE OF ADMISSION:  02/13/2019   ADMITTING PHYSICIAN: Lang Snow, NP  DATE OF DISCHARGE: 02/16/2019  3:47 PM  PRIMARY CARE PHYSICIAN: Lowella Bandy, MD   ADMISSION DIAGNOSIS:   Bright red blood per rectum [K62.5]  DISCHARGE DIAGNOSIS:   Active Problems:   Rectal bleed   SECONDARY DIAGNOSIS:   Past Medical History:  Diagnosis Date   COPD (chronic obstructive pulmonary disease) (HCC)    Coronary artery disease    Diabetes mellitus without complication ([place])    ESRD on dialysis ([place])    Heart murmur    Hyperlipidemia    Hypertension    Stroke [facility])     HOSPITAL COURSE:   68 year old female with past medical history significant for end-stage renal disease on Monday Wednesday Friday hemodialysis, CAD, COPD, peripheral vascular disease, hypertension, history of stroke, diabetes presents to hospital secondary to rectal bleed.  1.  Bright red blood per rectum-patient on aspirin and Plavix at home. -Her hemoglobin has remained stable through the hospital course.  She has not received any transfusions -Appreciate GI consult.  She had EGD and colonoscopy done which showed only mild gastritis and some colitis cannot rule out ischemic in nature. -Since no active bleeding and stable hemoglobin-recommended outpatient follow-up -We will resume her aspirin and Plavix at discharge  2.  End-stage renal disease on hemodialysis-Monday Wednesday Friday schedule. -Seen by nephrology in the hospital and has had dialysis per schedule here.  Initial trouble accessing her AV graft but was successful  3.  Chronic diastolic heart failure-last echo with EF of 55 to 60% from June 2020 -Continue low-salt diet, metoprolol and torsemide  4.  History of stroke-restarted aspirin and Plavix at discharge  5.  History of  seizures-Keppra  6.  Diabetes mellitus-resume home insulin at discharge. On NPH insulin  Feels stable and very eager to be discharged today  DISCHARGE CONDITIONS:   Guarded  CONSULTS OBTAINED:   Treatment Team:  Efrain Sella, MD Anthonette Legato, MD  DRUG ALLERGIES:   Allergies  Allergen Reactions   Hydrocodone     Intolerant more than allergic   Tape Itching    Skin Dermatitis/itching (tape adhesive) Skin Dermatitis/itching (tape adhesive)   Tapentadol Itching    Skin Dermatitis/itching (tape adhesive) Skin Dermatitis/itching (tape adhesive)    DISCHARGE MEDICATIONS:   Allergies as of 02/16/2019      Reactions   Hydrocodone    Intolerant more than allergic   Tape Itching   Skin Dermatitis/itching (tape adhesive) Skin Dermatitis/itching (tape adhesive)   Tapentadol Itching   Skin Dermatitis/itching (tape adhesive) Skin Dermatitis/itching (tape adhesive)      Medication List    TAKE these medications   acetaminophen 325 MG tablet Commonly known as: TYLENOL Take 2 tablets (650 mg total) by mouth every 4 (four) hours as needed for mild pain (or temp > 37.5 C (99.5 F)).   amLODipine 5 MG tablet Commonly known as: NORVASC Take 5 mg by mouth 2 (two) times daily.   aspirin EC 81 MG tablet Take 1 tablet (81 mg total) by mouth daily. What changed:   medication strength  how much to take   atorvastatin 80 MG tablet Commonly known as: LIPITOR Take 80 mg by mouth every evening.   calcium acetate 667 MG capsule Commonly known as: PHOSLO Take 2,001 mg by  mouth 3 (three) times daily.   clopidogrel 75 MG tablet Commonly known as: PLAVIX TAKE ONE TABLET BY MOUTH ONCE DAILY   gabapentin 100 MG capsule Commonly known as: NEURONTIN Take 100 mg by mouth 3 (three) times daily.   insulin NPH-regular Human (70-30) 100 UNIT/ML injection Inject 25 Units into the skin 2 (two) times daily with a meal. Except dialysis days. Do not take on Tuesdays,  Thursdays, and Saturdays.   levETIRAcetam 250 MG tablet Commonly known as: KEPPRA Take 1 tablet (250 mg total) by mouth 3 (three) times a week. Take after dialysis on Monday, wed and Friday in addition to regular dose.   levETIRAcetam 1000 MG tablet Commonly known as: KEPPRA Take 1 tablet (1,000 mg total) by mouth daily.   lidocaine-prilocaine cream Commonly known as: EMLA Apply 1 application topically as needed (for port access).   losartan 100 MG tablet Commonly known as: COZAAR Take 100 mg by mouth at bedtime. Takes in the morning   metoprolol succinate 100 MG 24 hr tablet Commonly known as: TOPROL-XL Take 100 mg by mouth daily. Take with or immediately following a meal.   omeprazole 20 MG capsule Commonly known as: PRILOSEC Take 20 mg by mouth 2 (two) times daily.   torsemide 100 MG tablet Commonly known as: DEMADEX Take 100 mg by mouth daily.        DISCHARGE INSTRUCTIONS:   1. PCP f/u in 1-2 weeks 2. For dialysis per schedule 3. GI f/u in 2-3 weeks  DIET:   Renal diet  ACTIVITY:   Activity as tolerated  OXYGEN:   Home Oxygen: No.  Oxygen Delivery: room air  DISCHARGE LOCATION:   home   If you experience worsening of your admission symptoms, develop shortness of breath, life threatening emergency, suicidal or homicidal thoughts you must seek medical attention immediately by calling 911 or calling your MD immediately  if symptoms less severe.  You Must read complete instructions/literature along with all the possible adverse reactions/side effects for all the Medicines you take and that have been prescribed to you. Take any new Medicines after you have completely understood and accpet all the possible adverse reactions/side effects.   Please note  You were cared for by a hospitalist during your hospital stay. If you have any questions about your discharge medications or the care you received while you were in the hospital after you are discharged,  you can call the unit and asked to speak with the hospitalist on call if the hospitalist that took care of you is not available. Once you are discharged, your primary care physician will handle any further medical issues. Please note that NO REFILLS for any discharge medications will be authorized once you are discharged, as it is imperative that you return to your primary care physician (or establish a relationship with a primary care physician if you do not have one) for your aftercare needs so that they can reassess your need for medications and monitor your lab values.    On the day of Discharge:  VITAL SIGNS:   Blood pressure 125/74, pulse 82, temperature 98.9 F (37.2 C), temperature source Oral, resp. rate 18, height 5\' 9"  (1.753 m), weight 122.5 kg, SpO2 94 %.  PHYSICAL EXAMINATION:    GENERAL:  68 y.o.-year-old obese patient lying in the bed with no acute distress.  EYES: Pupils equal, round, reactive to light and accommodation. No scleral icterus. Extraocular muscles intact.  HEENT: Head atraumatic, normocephalic. Oropharynx and nasopharynx clear.  NECK:  Supple, no jugular venous distention. No thyroid enlargement, no tenderness.  LUNGS: Normal breath sounds bilaterally, no wheezing, rales,rhonchi or crepitation. No use of accessory muscles of respiration. Decreased bibasilar breath sounds CARDIOVASCULAR: S1, S2 normal. No  rubs, or gallops. 2/6 systolic murmur present ABDOMEN: Soft, nontender, nondistended. Bowel sounds present. No organomegaly or mass.  EXTREMITIES: No pedal edema, cyanosis, or clubbing.  Left upper extremity AV graft NEUROLOGIC: Cranial nerves II through XII are intact. Muscle strength 5/5 in all extremities. Sensation intact. Gait not checked.  PSYCHIATRIC: The patient is alert and oriented x 3.  SKIN: No obvious rash, lesion, or ulcer.   DATA REVIEW:   CBC Recent Labs  Lab 02/14/19 0505  WBC 6.3  HGB 10.4*  HCT 33.7*  PLT 189    Chemistries   Recent Labs  Lab 02/13/19 1014 02/14/19 0505  02/16/19 0723  NA 138 141  --  137  K 5.5* 5.4*   < > 5.1  CL 100 105  --  101  CO2 23 23  --  25  GLUCOSE 240* 195*  --  147*  BUN 56* 65*  --  32*  CREATININE 10.00* 11.18*  --  7.90*  CALCIUM 8.7* 8.3*  --  8.5*  MG  --  1.9  --   --   AST 17  --   --   --   ALT 17  --   --   --   ALKPHOS 119  --   --   --   BILITOT 0.6  --   --   --    < > = values in this interval not displayed.     Microbiology Results  Results for orders placed or performed during the hospital encounter of 02/13/19  SARS Coronavirus 2 (CEPHEID- Performed in [place] hospital lab), Hosp Order     Status: None   Collection Time: 02/13/19 12:41 PM   Specimen: Nasopharyngeal Swab  Result Value Ref Range Status   SARS Coronavirus 2 NEGATIVE NEGATIVE Final    Comment: (NOTE) If result is NEGATIVE SARS-CoV-2 target nucleic acids are NOT DETECTED. The SARS-CoV-2 RNA is generally detectable in upper and lower  respiratory specimens during the acute phase of infection. The lowest  concentration of SARS-CoV-2 viral copies this assay can detect is 250  copies / mL. A negative result does not preclude SARS-CoV-2 infection  and should not be used as the sole basis for treatment or other  patient management decisions.  A negative result may occur with  improper specimen collection / handling, submission of specimen other  than nasopharyngeal swab, presence of viral mutation(s) within the  areas targeted by this assay, and inadequate number of viral copies  (<250 copies / mL). A negative result must be combined with clinical  observations, patient history, and epidemiological information. If result is POSITIVE SARS-CoV-2 target nucleic acids are DETECTED. The SARS-CoV-2 RNA is generally detectable in upper and lower  respiratory specimens dur ing the acute phase of infection.  Positive  results are indicative of active infection with SARS-CoV-2.  Clinical   correlation with patient history and other diagnostic information is  necessary to determine patient infection status.  Positive results do  not rule out bacterial infection or co-infection with other viruses. If result is PRESUMPTIVE POSTIVE SARS-CoV-2 nucleic acids MAY BE PRESENT.   A presumptive positive result was obtained on the submitted specimen  and confirmed on repeat testing.  While 2019 novel coronavirus  (SARS-CoV-2) nucleic  acids may be present in the submitted sample  additional confirmatory testing may be necessary for epidemiological  and / or clinical management purposes  to differentiate between  SARS-CoV-2 and other Sarbecovirus currently known to infect humans.  If clinically indicated additional testing with an alternate test  methodology [phone]) is advised. The SARS-CoV-2 RNA is generally  detectable in upper and lower respiratory sp ecimens during the acute  phase of infection. The expected result is Negative. Fact Sheet for Patients:  StrictlyIdeas.no Fact Sheet for Healthcare Providers: BankingDealers.co.za This test is not yet approved or cleared by the [place] FDA and has been authorized for detection and/or diagnosis of SARS-CoV-2 by FDA under an Emergency Use Authorization (EUA).  This EUA will remain in effect (meaning this test can be used) for the duration of the COVID-19 declaration under Section 564(b)(1) of the Act, 21 U.S.C. section 360bbb-3(b)(1), unless the authorization is terminated or revoked sooner. Performed at [facility], [address] [place], [place] [address]   MRSA PCR Screening     Status: None   Collection Time: 02/13/19  5:15 PM   Specimen: Nasopharyngeal  Result Value Ref Range Status   MRSA by PCR NEGATIVE NEGATIVE Final    Comment:        The GeneXpert MRSA Assay (FDA approved for NASAL specimens only), is one component of a comprehensive MRSA  colonization surveillance program. It is not intended to diagnose MRSA infection nor to guide or monitor treatment for MRSA infections. Performed at [facility], [address], [place], [place] [address]     RADIOLOGY:  No results found.   Management plans discussed with the patient, family and they are in agreement.  CODE STATUS:  Code Status History    Date Active Date Inactive Code Status Order ID Comments User Context   02/13/2019 1329 02/16/2019 1849 Full Code [id number]  Lang Snow, NP ED   12/31/2018 1159 01/02/2019 1330 Full Code [id number]  Lang Snow, NP ED   05/29/2018 1541 05/30/2018 2041 Full Code [id number]  Loletha Grayer, MD ED   03/14/2018 0141 03/15/2018 0336 Full Code [id number]  Amelia Jo, MD Inpatient   03/08/2018 0005 03/09/2018 2108 Full Code [id number]  Vaughan Basta, MD Inpatient   11/15/2017 0017 11/17/2017 1809 Full Code [id number]  Lance Coon, MD ED   11/12/2017 2219 11/13/2017 1935 Full Code [id number]  Lance Coon, MD ED   04/14/2016 1330 04/14/2016 1700 Full Code [id number]  Bettey Costa, MD ED   08/08/2015 2257 08/09/2015 2054 Full Code [id number]  Lavetta Nielsen, Aaron Mose, MD ED   04/17/2015 1334 04/17/2015 1836 Full Code [id number]  Dew, Erskine Squibb, MD Inpatient   Advance Care Planning Activity      TOTAL TIME TAKING CARE OF THIS PATIENT: 38  minutes.    Gladstone Lighter M.D on 02/17/2019 at 10:13 AM  Between 7am to 6pm - Pager - [phone]  After 6pm go to [email or web address] - Proofreader  [facility]  Office  [phone]  CC: Primary care physician; Lowella Bandy, MD   Note: This dictation was prepared with Dragon dictation along with smaller phrase technology. Any transcriptional errors that result from this process are unintentional.

## 2019-02-18 ENCOUNTER — Ambulatory Visit (INDEPENDENT_AMBULATORY_CARE_PROVIDER_SITE_OTHER): Payer: Medicare HMO | Admitting: *Deleted

## 2019-02-18 ENCOUNTER — Encounter: Payer: Self-pay | Admitting: Internal Medicine

## 2019-02-18 DIAGNOSIS — I6310 Cerebral infarction due to embolism of unspecified precerebral artery: Secondary | ICD-10-CM | POA: Diagnosis not present

## 2019-02-19 LAB — CUP PACEART REMOTE DEVICE CHECK
Date Time Interrogation Session: 20200727204117
Implantable Pulse Generator Implant Date: 20190425

## 2019-02-19 LAB — SURGICAL PATHOLOGY

## 2019-03-08 NOTE — Progress Notes (Signed)
[facility]link Summary Report / Loop Recorder 

## 2019-03-23 ENCOUNTER — Other Ambulatory Visit: Payer: Self-pay

## 2019-03-23 ENCOUNTER — Ambulatory Visit (INDEPENDENT_AMBULATORY_CARE_PROVIDER_SITE_OTHER): Payer: Medicare HMO

## 2019-03-23 ENCOUNTER — Ambulatory Visit
Admission: EM | Admit: 2019-03-23 | Discharge: 2019-03-23 | Disposition: A | Discharge status: Home / Self Care | Payer: Medicare HMO | Attending: Emergency Medicine | Admitting: Emergency Medicine

## 2019-03-23 ENCOUNTER — Encounter: Payer: Self-pay | Admitting: Emergency Medicine

## 2019-03-23 DIAGNOSIS — M549 Dorsalgia, unspecified: Secondary | ICD-10-CM | POA: Diagnosis not present

## 2019-03-23 DIAGNOSIS — R079 Chest pain, unspecified: Secondary | ICD-10-CM | POA: Diagnosis not present

## 2019-03-23 LAB — CBC WITH DIFFERENTIAL/PLATELET
Abs Immature Granulocytes: 0.03 10*3/uL (ref 0.00–0.07)
Basophils Absolute: 0.1 10*3/uL (ref 0.0–0.1)
Basophils Relative: 1 %
Eosinophils Absolute: 0.1 10*3/uL (ref 0.0–0.5)
Eosinophils Relative: 2 %
HCT: 34.7 % — ABNORMAL LOW (ref 36.0–46.0)
Hemoglobin: 10.9 g/dL — ABNORMAL LOW (ref 12.0–15.0)
Immature Granulocytes: 1 %
Lymphocytes Relative: 24 %
Lymphs Abs: 1.6 10*3/uL (ref 0.7–4.0)
MCH: 28.4 pg (ref 26.0–34.0)
MCHC: 31.4 g/dL (ref 30.0–36.0)
MCV: 90.4 fL (ref 80.0–100.0)
Monocytes Absolute: 0.8 10*3/uL (ref 0.1–1.0)
Monocytes Relative: 13 %
Neutro Abs: 3.9 10*3/uL (ref 1.7–7.7)
Neutrophils Relative %: 59 %
Platelets: 182 10*3/uL (ref 150–400)
RBC: 3.84 MIL/uL — ABNORMAL LOW (ref 3.87–5.11)
RDW: 14.6 % (ref 11.5–15.5)
WBC: 6.5 10*3/uL (ref 4.0–10.5)
nRBC: 0 % (ref 0.0–0.2)

## 2019-03-23 LAB — COMPREHENSIVE METABOLIC PANEL
ALT: 19 U/L (ref 0–44)
AST: 18 U/L (ref 15–41)
Albumin: 3.8 g/dL (ref 3.5–5.0)
Alkaline Phosphatase: 119 U/L (ref 38–126)
Anion gap: 17 — ABNORMAL HIGH (ref 5–15)
BUN: 36 mg/dL — ABNORMAL HIGH (ref 8–23)
CO2: 25 mmol/L (ref 22–32)
Calcium: 8.3 mg/dL — ABNORMAL LOW (ref 8.9–10.3)
Chloride: 95 mmol/L — ABNORMAL LOW (ref 98–111)
Creatinine, Ser: 7.7 mg/dL — ABNORMAL HIGH (ref 0.44–1.00)
GFR calc Af Amer: 6 mL/min — ABNORMAL LOW (ref >60)
GFR calc non Af Amer: 5 mL/min — ABNORMAL LOW (ref >60)
Glucose, Bld: 251 mg/dL — ABNORMAL HIGH (ref 70–99)
Potassium: 4.3 mmol/L (ref 3.5–5.1)
Sodium: 137 mmol/L (ref 135–145)
Total Bilirubin: 0.5 mg/dL (ref 0.3–1.2)
Total Protein: 7.8 g/dL (ref 6.5–8.1)

## 2019-03-23 MED ORDER — DICLOFENAC SODIUM 1 % TD GEL: 4.0000 g | Freq: Four times a day (QID) | TRANSDERMAL | 0 refills | Status: DC

## 2019-03-23 NOTE — Discharge Instructions (Signed)
You were seen for back pain. Use the prescribed cream as directed and follow-up with your main doctor if the pain doesn't get better.   We will call you if the radiologist sees something that we didn't see during your visit.

## 2019-03-23 NOTE — ED Provider Notes (Signed)
[facility] - [place], [place]   Name: Cynthia Dean DOB: 1951-06-23 MRN: [id number] CSN: [id number] PCP: Lowella Bandy, MD  Arrival date and time:  03/23/19 0813  Chief Complaint:  Back Pain   NOTE: Prior to seeing the patient today, I have reviewed the triage nursing documentation and vital signs. Clinical staff has updated patient's PMH/PSHx, current medication list, and drug allergies/intolerances to ensure comprehensive history available to assist in medical decision making.   History:   HPI: Cynthia Dean is a 68 y.o. female who presents today with complaints of right sided back pain for 6 days. Pt states the pain is deep in her right side and she has never had pain like this before. She rates the pain as a 10/10 aching pain that is constant. She used her daughter's prescription "pain cream", but she states that pain was worse with application. Denies injury to area or change to urinary function. She also denies N/V/D, cough, but she states she does have some increased pain to area with deep breathing. The pain radiates to right chest wall only.    Past Medical History:  Diagnosis Date  . COPD (chronic obstructive pulmonary disease) ([place])   . Coronary artery disease   . Diabetes mellitus without complication ([place])   . ESRD on dialysis ([place])   . Heart murmur   . Hyperlipidemia   . Hypertension   . Stroke [facility])     Past Surgical History:  Procedure Laterality Date  . A/V FISTULAGRAM Left 12/20/2016   Procedure: A/V Fistulagram;  Surgeon: Katha Cabal, MD;  Location: [place] [facility];  Service: Cardiovascular;  Laterality: Left;  . A/V SHUNT INTERVENTION N/A 12/20/2016   Procedure: A/V Shunt Intervention;  Surgeon: Katha Cabal, MD;  Location: [place] [facility];  Service: Cardiovascular;  Laterality: N/A;  . A/V SHUNTOGRAM Left 09/11/2017   Procedure: A/V SHUNTOGRAM;  Surgeon: Algernon Huxley, MD;  Location: [place] [facility];   Service: Cardiovascular;  Laterality: Left;  . BREAST BIOPSY Bilateral 07/19/00   neg  . COLONOSCOPY WITH PROPOFOL N/A 02/16/2019   Procedure: COLONOSCOPY WITH PROPOFOL;  Surgeon: Toledo, Benay Pike, MD;  Location: [facility];  Service: Gastroenterology;  Laterality: N/A;  . ESOPHAGOGASTRODUODENOSCOPY (EGD) WITH PROPOFOL N/A 02/16/2019   Procedure: ESOPHAGOGASTRODUODENOSCOPY (EGD) WITH PROPOFOL;  Surgeon: Toledo, Benay Pike, MD;  Location: [facility];  Service: Gastroenterology;  Laterality: N/A;  . LOOP RECORDER INSERTION N/A 11/16/2017   Procedure: LOOP RECORDER INSERTION;  Surgeon: Deboraha Sprang, MD;  Location: [place] [facility];  Service: Cardiovascular;  Laterality: N/A;  . PERIPHERAL VASCULAR CATHETERIZATION Left 02/02/2015   Procedure: A/V Shuntogram/Fistulagram;  Surgeon: Algernon Huxley, MD;  Location: [place] [facility];  Service: Cardiovascular;  Laterality: Left;  . PERIPHERAL VASCULAR CATHETERIZATION Left 02/02/2015   Procedure: A/V Shunt Intervention;  Surgeon: Algernon Huxley, MD;  Location: [place] [facility];  Service: Cardiovascular;  Laterality: Left;  . PERIPHERAL VASCULAR CATHETERIZATION Left 03/09/2015   Procedure: A/V Shuntogram/Fistulagram;  Surgeon: Algernon Huxley, MD;  Location: [place] [facility];  Service: Cardiovascular;  Laterality: Left;  . PERIPHERAL VASCULAR CATHETERIZATION N/A 03/09/2015   Procedure: A/V Shunt Intervention;  Surgeon: Algernon Huxley, MD;  Location: [place] [facility];  Service: Cardiovascular;  Laterality: N/A;  . PERIPHERAL VASCULAR CATHETERIZATION Left 04/17/2015   Procedure: Upper Extremity Angiography;  Surgeon: Algernon Huxley, MD;  Location: [place] [facility];  Service: Cardiovascular;  Laterality:  Left;  . PERIPHERAL VASCULAR CATHETERIZATION  04/17/2015   Procedure: Upper Extremity Intervention;  Surgeon: Algernon Huxley, MD;  Location: [place] [facility];  Service: Cardiovascular;;  . PERIPHERAL VASCULAR CATHETERIZATION N/A 08/10/2015    Procedure: A/V Shuntogram/Fistulagram;  Surgeon: Algernon Huxley, MD;  Location: [place] [facility];  Service: Cardiovascular;  Laterality: N/A;  . PERIPHERAL VASCULAR CATHETERIZATION N/A 08/10/2015   Procedure: A/V Shunt Intervention;  Surgeon: Algernon Huxley, MD;  Location: [place] [facility];  Service: Cardiovascular;  Laterality: N/A;  . PERIPHERAL VASCULAR CATHETERIZATION Left 04/11/2016   Procedure: A/V Shuntogram/Fistulagram;  Surgeon: Algernon Huxley, MD;  Location: [place] [facility];  Service: Cardiovascular;  Laterality: Left;  . TEE WITHOUT CARDIOVERSION N/A 11/16/2017   Procedure: TRANSESOPHAGEAL ECHOCARDIOGRAM (TEE);  Surgeon: Minna Merritts, MD;  Location: [facility];  Service: Cardiovascular;  Laterality: N/A;    Family History  Problem Relation Age of Onset  . Breast cancer Father 6  . Stroke Mother   . Heart attack Mother   . Heart Problems Sister   . Breast cancer Cousin 10       1 st cousin. Maternal     Social History   Tobacco Use  . Smoking status: Former Smoker    Packs/day: 0.00    Types: Cigarettes  . Smokeless tobacco: Never Used  Substance Use Topics  . Alcohol use: No  . Drug use: No    Patient Active Problem List   Diagnosis Date Noted  . Rectal bleed 02/13/2019  . Acute respiratory failure with hypoxia ([place]) 12/31/2018  . Nausea & vomiting 05/30/2018  . Nausea and vomiting 05/29/2018  . Partial seizure ([place]) 03/14/2018  . Stroke ([place]) 03/07/2018  . Slurred speech 11/14/2017  . Diabetes ([place]) 11/12/2017  . CAD (coronary artery disease) 11/12/2017  . COPD (chronic obstructive pulmonary disease) ([place]) 11/12/2017  . ESRD on dialysis ([place]) 11/12/2017  . Hematemesis 06/27/2017  . CVA (cerebral vascular accident) ([place]) 04/14/2016  . Essential hypertension, benign 11/27/2015  . Hyperlipidemia 11/27/2015  . Obesity 11/27/2015  . Prolonged Q-T interval on ECG 11/26/2015  . Intractable nausea and vomiting 08/08/2015    Home Medications:    Current  Meds  Medication Sig  . amLODipine (NORVASC) 5 MG tablet Take 5 mg by mouth 2 (two) times daily.  [name] aspirin EC 81 MG tablet Take 1 tablet (81 mg total) by mouth daily.  [name] atorvastatin (LIPITOR) 80 MG tablet Take 80 mg by mouth every evening.   . calcium acetate (PHOSLO) 667 MG capsule Take 2,001 mg by mouth 3 (three) times daily.  . clopidogrel (PLAVIX) 75 MG tablet TAKE ONE TABLET BY MOUTH ONCE DAILY (Patient taking differently: Take 75 mg by mouth daily. )  . gabapentin (NEURONTIN) 100 MG capsule Take 100 mg by mouth 3 (three) times daily.  . insulin NPH-regular Human (NOVOLIN 70/30) (70-30) 100 UNIT/ML injection Inject 25 Units into the skin 2 (two) times daily with a meal. Except dialysis days. Do not take on Tuesdays, Thursdays, and Saturdays.  [name] levETIRAcetam (KEPPRA) 1000 MG tablet Take 1 tablet (1,000 mg total) by mouth daily.  [name] levETIRAcetam (KEPPRA) 250 MG tablet Take 1 tablet (250 mg total) by mouth 3 (three) times a week. Take after dialysis on Monday, wed and Friday in addition to regular dose.  . losartan (COZAAR) 100 MG tablet Take 100 mg by mouth at bedtime. Takes in the morning  . metoprolol succinate (TOPROL-XL) 100 MG 24 hr tablet Take  100 mg by mouth daily. Take with or immediately following a meal.   . omeprazole (PRILOSEC) 20 MG capsule Take 20 mg by mouth 2 (two) times daily.   [name] torsemide (DEMADEX) 100 MG tablet Take 100 mg by mouth daily.    Allergies:   Hydrocodone, Tape, and Tapentadol  Review of Systems (ROS): Review of Systems  Constitutional: Positive for activity change. Negative for appetite change, fatigue and fever.  Respiratory: Negative for cough.   Gastrointestinal: Negative for diarrhea, nausea and vomiting.  Musculoskeletal: Positive for back pain and myalgias. Negative for arthralgias and gait problem.     Vital Signs: Today's Vitals   03/23/19 0824 03/23/19 0825 03/23/19 0828 03/23/19 0957  BP:   (!) 164/70   Resp:   16   Temp:   98.1 F  (36.7 C)   TempSrc:   Oral   SpO2:   95%   Weight:  220 lb (99.8 kg)    Height:  5\' 9"  (1.753 m)    PainSc: 10-Worst pain ever   10-Worst pain ever    Physical Exam: Physical Exam Vitals signs and nursing note reviewed.  Constitutional:      General: She is not in acute distress.    Appearance: She is well-developed.  HENT:     Head: Normocephalic and atraumatic.  Eyes:     Conjunctiva/sclera: Conjunctivae normal.  Neck:     Musculoskeletal: Neck supple.  Cardiovascular:     Rate and Rhythm: Normal rate and regular rhythm.     Heart sounds: No murmur.  Pulmonary:     Effort: Pulmonary effort is normal. No respiratory distress.     Breath sounds: Normal breath sounds.  Abdominal:     Palpations: Abdomen is soft.     Tenderness: There is no abdominal tenderness. There is no right CVA tenderness.  Skin:    General: Skin is warm and dry.  Neurological:     Mental Status: She is alert.     Urgent Care Treatments / Results:   LABS: PLEASE NOTE: all labs that were ordered this encounter are listed, however only abnormal results are displayed. Labs Reviewed  CBC WITH DIFFERENTIAL/PLATELET - Abnormal; Notable for the following components:      Result Value   RBC 3.84 (*)    Hemoglobin 10.9 (*)    HCT 34.7 (*)    All other components within normal limits  COMPREHENSIVE METABOLIC PANEL - Abnormal; Notable for the following components:   Chloride 95 (*)    Glucose, Bld 251 (*)    BUN 36 (*)    Creatinine, Ser 7.70 (*)    Calcium 8.3 (*)    GFR calc non Af Amer 5 (*)    GFR calc Af Amer 6 (*)    Anion gap 17 (*)    All other components within normal limits    EKG: -None  RADIOLOGY: Dg Chest 2 View  Result Date: 03/23/2019 CLINICAL DATA:  Shortness of breath. EXAM: CHEST - 2 VIEW COMPARISON:  Chest x-ray dated December 31, 2018. FINDINGS: Unchanged mild cardiomegaly. Loop recorder again noted. Atherosclerotic calcification of the aortic arch. Normal pulmonary  vascularity. No focal consolidation, pleural effusion, or pneumothorax. No acute osseous abnormality. IMPRESSION: No active cardiopulmonary disease. Electronically Signed   By: Titus Dubin M.D.   On: 03/23/2019 09:38    PROCEDURES: Procedures  MEDICATIONS RECEIVED THIS VISIT: Medications - No data to display  PERTINENT CLINICAL COURSE NOTES/UPDATES:   Initial Impression / Assessment and  Plan / Urgent Care Course:  Pertinent labs & imaging results that were available during my care of the patient were personally reviewed by me and considered in my medical decision making (see lab/imaging section of note for values and interpretations).  Cynthia Dean is a 68 y.o. female who presents to [facility] today with complaints of back pain, diagnosed with mid back-pain, and treated as such with medications below. NP read CXR and interpreted as normal during visit prior to the official read from radiologist. NP and patient reviwed discharge instructions below during visit.   Patient is well appearing overall in clinic today. She does not appear to be in any acute distress. Presenting symptoms (see HPI) and exam as documented above.   I have reviewed the follow up and strict return precautions for any new or worsening symptoms. Patient is aware of symptoms that would be deemed urgent/emergent, and would thus require further evaluation either here or in the emergency department. At the time of discharge, she verbalized understanding and consent with the discharge plan as it was reviewed with her. All questions were fielded by provider and/or clinic staff prior to patient discharge.    Final Clinical Impressions / Urgent Care Diagnoses:   Final diagnoses:  Mid-back pain, acute    New Prescriptions:  [place] Controlled Substance Registry consulted? Not Applicable  Meds ordered this encounter  Medications  . diclofenac sodium (VOLTAREN) 1 % GEL    Sig: Apply 4 g topically 4 (four)  times daily.    Dispense:  100 g    Refill:  0      Discharge Instructions     You were seen for back pain. Use the prescribed cream as directed and follow-up with your main doctor if the pain doesn't get better.   We will call you if the radiologist sees something that we didn't see during your visit.      Recommended Follow up Care:  Patient encouraged to follow up with the following provider within the specified time frame, or sooner as dictated by the severity of her symptoms. As always, she was instructed that for any urgent/emergent care needs, she should seek care either here or in the emergency department for more immediate evaluation.   Gertie Baron, DNP, NP-c    Gertie Baron, NP 03/23/19 1010

## 2019-03-23 NOTE — ED Triage Notes (Signed)
Patient c/o right sided lower back pain that started 6 days ago.  Patient denies any injury.  Patient denies any urinary symptoms.  Patient denies N/V.

## 2019-03-24 ENCOUNTER — Emergency Department
Admission: EM | Admit: 2019-03-24 | Discharge: 2019-03-24 | Disposition: A | Discharge status: Home / Self Care | Payer: Medicare HMO | Attending: Emergency Medicine | Admitting: Emergency Medicine

## 2019-03-24 DIAGNOSIS — Z8673 Personal history of transient ischemic attack (TIA), and cerebral infarction without residual deficits: Secondary | ICD-10-CM | POA: Insufficient documentation

## 2019-03-24 DIAGNOSIS — Z79899 Other long term (current) drug therapy: Secondary | ICD-10-CM | POA: Diagnosis not present

## 2019-03-24 DIAGNOSIS — Z992 Dependence on renal dialysis: Secondary | ICD-10-CM | POA: Diagnosis not present

## 2019-03-24 DIAGNOSIS — I120 Hypertensive chronic kidney disease with stage 5 chronic kidney disease or end stage renal disease: Secondary | ICD-10-CM | POA: Diagnosis not present

## 2019-03-24 DIAGNOSIS — Z794 Long term (current) use of insulin: Secondary | ICD-10-CM | POA: Insufficient documentation

## 2019-03-24 DIAGNOSIS — B029 Zoster without complications: Secondary | ICD-10-CM | POA: Diagnosis not present

## 2019-03-24 DIAGNOSIS — E1122 Type 2 diabetes mellitus with diabetic chronic kidney disease: Secondary | ICD-10-CM | POA: Diagnosis not present

## 2019-03-24 DIAGNOSIS — N186 End stage renal disease: Secondary | ICD-10-CM | POA: Diagnosis not present

## 2019-03-24 DIAGNOSIS — Z7901 Long term (current) use of anticoagulants: Secondary | ICD-10-CM | POA: Insufficient documentation

## 2019-03-24 DIAGNOSIS — J449 Chronic obstructive pulmonary disease, unspecified: Secondary | ICD-10-CM | POA: Diagnosis not present

## 2019-03-24 DIAGNOSIS — Z7982 Long term (current) use of aspirin: Secondary | ICD-10-CM | POA: Diagnosis not present

## 2019-03-24 DIAGNOSIS — Z87891 Personal history of nicotine dependence: Secondary | ICD-10-CM | POA: Diagnosis not present

## 2019-03-24 DIAGNOSIS — I2510 Atherosclerotic heart disease of native coronary artery without angina pectoris: Secondary | ICD-10-CM | POA: Insufficient documentation

## 2019-03-24 DIAGNOSIS — M7918 Myalgia, other site: Secondary | ICD-10-CM | POA: Diagnosis present

## 2019-03-24 LAB — CUP PACEART REMOTE DEVICE CHECK
Date Time Interrogation Session: 20200829213638
Implantable Pulse Generator Implant Date: 20190425

## 2019-03-24 MED ORDER — VALACYCLOVIR HCL 500 MG PO TABS
500.0000 mg | ORAL_TABLET | Freq: Once | ORAL | Status: AC
  Administered 2019-03-24: 06:00:00 500 mg via ORAL
  Filled 2019-03-24: qty 1

## 2019-03-24 MED ORDER — VALACYCLOVIR HCL 500 MG PO TABS: 500.0000 mg | ORAL_TABLET | Freq: Every day | ORAL | 0 refills | Status: AC

## 2019-03-24 MED ORDER — GABAPENTIN 100 MG PO CAPS: 100.0000 mg | ORAL_CAPSULE | Freq: Three times a day (TID) | ORAL | 0 refills | Status: DC

## 2019-03-24 MED ORDER — GABAPENTIN 100 MG PO CAPS
100.0000 mg | ORAL_CAPSULE | Freq: Once | ORAL | Status: AC
  Administered 2019-03-24: 06:00:00 100 mg via ORAL
  Filled 2019-03-24: qty 1

## 2019-03-24 NOTE — ED Notes (Signed)
Pt to ED via wheelchair with c/o right flank and mid back pain; seen at [facility] urgent care on 03/23/19

## 2019-03-24 NOTE — ED Provider Notes (Signed)
[facility] Emergency Department Provider Note  ____________________________________________  Time seen: Approximately 6:00 AM  I have reviewed the triage vital signs and the nursing notes.   HISTORY  Chief Complaint Back Pain and Flank Pain   HPI Cynthia Dean is a 68 y.o. female with several comorbidities as listed below who presents for evaluation of right-sided chest and upper back pain.  Her symptoms started 6 days ago.  She describes the pain as burning.  Reports that just the touch of her clothes hurts her skin.  She went to urgent care earlier today and had x-rays and labs with no acute findings.  Upon arriving home she noticed a rash which prompted her visit back to the emergency room.  She reports a history of chickenpox as a child.  Has never had shingles before.   Past Medical History:  Diagnosis Date  . COPD (chronic obstructive pulmonary disease) ([place])   . Coronary artery disease   . Diabetes mellitus without complication ([place])   . ESRD on dialysis ([place])   . Heart murmur   . Hyperlipidemia   . Hypertension   . Stroke [facility])     Patient Active Problem List   Diagnosis Date Noted  . Rectal bleed 02/13/2019  . Acute respiratory failure with hypoxia ([place]) 12/31/2018  . Nausea & vomiting 05/30/2018  . Nausea and vomiting 05/29/2018  . Partial seizure ([place]) 03/14/2018  . Stroke ([place]) 03/07/2018  . Slurred speech 11/14/2017  . Diabetes ([place]) 11/12/2017  . CAD (coronary artery disease) 11/12/2017  . COPD (chronic obstructive pulmonary disease) ([place]) 11/12/2017  . ESRD on dialysis ([place]) 11/12/2017  . Hematemesis 06/27/2017  . CVA (cerebral vascular accident) ([place]) 04/14/2016  . Essential hypertension, benign 11/27/2015  . Hyperlipidemia 11/27/2015  . Obesity 11/27/2015  . Prolonged Q-T interval on ECG 11/26/2015  . Intractable nausea and vomiting 08/08/2015    Past Surgical History:  Procedure Laterality Date  . A/V  FISTULAGRAM Left 12/20/2016   Procedure: A/V Fistulagram;  Surgeon: Katha Cabal, MD;  Location: [place] [facility];  Service: Cardiovascular;  Laterality: Left;  . A/V SHUNT INTERVENTION N/A 12/20/2016   Procedure: A/V Shunt Intervention;  Surgeon: Katha Cabal, MD;  Location: [place] [facility];  Service: Cardiovascular;  Laterality: N/A;  . A/V SHUNTOGRAM Left 09/11/2017   Procedure: A/V SHUNTOGRAM;  Surgeon: Algernon Huxley, MD;  Location: [place] [facility];  Service: Cardiovascular;  Laterality: Left;  . BREAST BIOPSY Bilateral 07/19/00   neg  . COLONOSCOPY WITH PROPOFOL N/A 02/16/2019   Procedure: COLONOSCOPY WITH PROPOFOL;  Surgeon: Toledo, Benay Pike, MD;  Location: [facility];  Service: Gastroenterology;  Laterality: N/A;  . ESOPHAGOGASTRODUODENOSCOPY (EGD) WITH PROPOFOL N/A 02/16/2019   Procedure: ESOPHAGOGASTRODUODENOSCOPY (EGD) WITH PROPOFOL;  Surgeon: Toledo, Benay Pike, MD;  Location: [facility] ENDOSCOPY;  Service: Gastroenterology;  Laterality: N/A;  . LOOP RECORDER INSERTION N/A 11/16/2017   Procedure: LOOP RECORDER INSERTION;  Surgeon: Deboraha Sprang, MD;  Location: [place] [facility];  Service: Cardiovascular;  Laterality: N/A;  . PERIPHERAL VASCULAR CATHETERIZATION Left 02/02/2015   Procedure: A/V Shuntogram/Fistulagram;  Surgeon: Algernon Huxley, MD;  Location: [place] [facility];  Service: Cardiovascular;  Laterality: Left;  . PERIPHERAL VASCULAR CATHETERIZATION Left 02/02/2015   Procedure: A/V Shunt Intervention;  Surgeon: Algernon Huxley, MD;  Location: [place] [facility];  Service: Cardiovascular;  Laterality: Left;  . PERIPHERAL VASCULAR CATHETERIZATION Left 03/09/2015   Procedure: A/V Shuntogram/Fistulagram;  Surgeon: Erskine Squibb  Lucky Cowboy, MD;  Location: [place] [facility];  Service: Cardiovascular;  Laterality: Left;  . PERIPHERAL VASCULAR CATHETERIZATION N/A 03/09/2015   Procedure: A/V Shunt Intervention;  Surgeon: Algernon Huxley, MD;  Location: [place] [facility];   Service: Cardiovascular;  Laterality: N/A;  . PERIPHERAL VASCULAR CATHETERIZATION Left 04/17/2015   Procedure: Upper Extremity Angiography;  Surgeon: Algernon Huxley, MD;  Location: [place] [facility];  Service: Cardiovascular;  Laterality: Left;  . PERIPHERAL VASCULAR CATHETERIZATION  04/17/2015   Procedure: Upper Extremity Intervention;  Surgeon: Algernon Huxley, MD;  Location: [place] [facility];  Service: Cardiovascular;;  . PERIPHERAL VASCULAR CATHETERIZATION N/A 08/10/2015   Procedure: A/V Shuntogram/Fistulagram;  Surgeon: Algernon Huxley, MD;  Location: [place] [facility];  Service: Cardiovascular;  Laterality: N/A;  . PERIPHERAL VASCULAR CATHETERIZATION N/A 08/10/2015   Procedure: A/V Shunt Intervention;  Surgeon: Algernon Huxley, MD;  Location: [place] [facility];  Service: Cardiovascular;  Laterality: N/A;  . PERIPHERAL VASCULAR CATHETERIZATION Left 04/11/2016   Procedure: A/V Shuntogram/Fistulagram;  Surgeon: Algernon Huxley, MD;  Location: [place] [facility];  Service: Cardiovascular;  Laterality: Left;  . TEE WITHOUT CARDIOVERSION N/A 11/16/2017   Procedure: TRANSESOPHAGEAL ECHOCARDIOGRAM (TEE);  Surgeon: Minna Merritts, MD;  Location: [facility];  Service: Cardiovascular;  Laterality: N/A;    Prior to Admission medications   Medication Sig Start Date End Date Taking? Authorizing Provider  acetaminophen (TYLENOL) 325 MG tablet Take 2 tablets (650 mg total) by mouth every 4 (four) hours as needed for mild pain (or temp > 37.5 C (99.5 F)). 11/17/17   Gouru, Illene Silver, MD  amLODipine (NORVASC) 5 MG tablet Take 5 mg by mouth 2 (two) times daily. 08/07/18   [provider]  aspirin EC 81 MG tablet Take 1 tablet (81 mg total) by mouth daily. 02/16/19   Gladstone Lighter, MD  atorvastatin (LIPITOR) 80 MG tablet Take 80 mg by mouth every evening.     [provider]  calcium acetate (PHOSLO) 667 MG capsule Take 2,001 mg by mouth 3 (three) times daily. 01/21/19   [provider]   clopidogrel (PLAVIX) 75 MG tablet TAKE ONE TABLET BY MOUTH ONCE DAILY Patient taking differently: Take 75 mg by mouth daily.  06/14/16   Stegmayer, Joelene Millin A, PA-C  diclofenac sodium (VOLTAREN) 1 % GEL Apply 4 g topically 4 (four) times daily. 03/23/19   Gertie Baron, NP  gabapentin (NEURONTIN) 100 MG capsule Take 1 capsule (100 mg total) by mouth 3 (three) times daily. 03/24/19 03/23/20  Rudene Re, MD  insulin NPH-regular Human (NOVOLIN 70/30) (70-30) 100 UNIT/ML injection Inject 25 Units into the skin 2 (two) times daily with a meal. Except dialysis days. Do not take on Tuesdays, Thursdays, and Saturdays. 11/17/17   Nicholes Mango, MD  levETIRAcetam (KEPPRA) 1000 MG tablet Take 1 tablet (1,000 mg total) by mouth daily. 03/15/18   Vaughan Basta, MD  levETIRAcetam (KEPPRA) 250 MG tablet Take 1 tablet (250 mg total) by mouth 3 (three) times a week. Take after dialysis on Monday, wed and Friday in addition to regular dose. 03/14/18   Vaughan Basta, MD  lidocaine-prilocaine (EMLA) cream Apply 1 application topically as needed (for port access).     [provider]  losartan (COZAAR) 100 MG tablet Take 100 mg by mouth at bedtime. Takes in the morning 09/12/17   [provider]  metoprolol succinate (TOPROL-XL) 100 MG 24 hr tablet Take 100 mg by mouth daily. Take with or immediately  following a meal.     [provider]  omeprazole (PRILOSEC) 20 MG capsule Take 20 mg by mouth 2 (two) times daily.     [provider]  torsemide (DEMADEX) 100 MG tablet Take 100 mg by mouth daily. 10/10/17   [provider]  valACYclovir (VALTREX) 500 MG tablet Take 1 tablet (500 mg total) by mouth daily for 10 days. 03/24/19 04/03/19  Rudene Re, MD    Allergies Hydrocodone, Tape, and Tapentadol  Family History  Problem Relation Age of Onset  . Breast cancer Father 78  . Stroke Mother   . Heart attack Mother   . Heart Problems Sister   . Breast  cancer Cousin 35       1 st cousin. Maternal     Social History Social History   Tobacco Use  . Smoking status: Former Smoker    Packs/day: 0.00    Types: Cigarettes  . Smokeless tobacco: Never Used  Substance Use Topics  . Alcohol use: No  . Drug use: No    Review of Systems  Constitutional: Negative for fever. Eyes: Negative for visual changes. ENT: Negative for sore throat. Neck: No neck pain  Cardiovascular: + R chest pain. Respiratory: Negative for shortness of breath. Gastrointestinal: Negative for abdominal pain, vomiting or diarrhea. Genitourinary: Negative for dysuria. Musculoskeletal: + R upper back pain. Skin: + rash. Neurological: Negative for headaches, weakness or numbness. Psych: No SI or HI  ____________________________________________   PHYSICAL EXAM:  VITAL SIGNS: ED Triage Vitals  Enc Vitals Group     BP 03/24/19 0434 (!) 178/74     Pulse Rate 03/24/19 0434 68     Resp 03/24/19 0434 18     Temp 03/24/19 0434 98.2 F (36.8 C)     Temp Source 03/24/19 0434 Oral     SpO2 03/24/19 0434 95 %     Weight 03/24/19 0435 230 lb (104.3 kg)     Height 03/24/19 0435 5\' 2"  (1.575 m)     Head Circumference --      Peak Flow --      Pain Score 03/24/19 0435 10     Pain Loc --      Pain Edu? --      Excl. in [place]? --     Constitutional: Alert and oriented. Well appearing and in no apparent distress. HEENT:      Head: Normocephalic and atraumatic.         Eyes: Conjunctivae are normal. Sclera is non-icteric.       Mouth/Throat: Mucous membranes are moist.       Neck: Supple with no signs of meningismus. Cardiovascular: Regular rate and rhythm. No murmurs, gallops, or rubs. 2+ symmetrical distal pulses are present in all extremities. No JVD. Respiratory: Normal respiratory effort. Lungs are clear to auscultation bilaterally. No wheezes, crackles, or rhonchi.  Gastrointestinal: Soft, non tender, and non distended with positive bowel sounds. No rebound or  guarding. Musculoskeletal: Nontender with normal range of motion in all extremities. No edema, cyanosis, or erythema of extremities. Neurologic: Normal speech and language. Face is symmetric. Moving all extremities. No gross focal neurologic deficits are appreciated. Skin: Skin is warm, dry and intact. Vesicular rash in a dermatomal distribution over the R breast and R upper back Psychiatric: Mood and affect are normal. Speech and behavior are normal.  ____________________________________________   LABS (all labs ordered are listed, but only abnormal results are displayed)  Labs Reviewed - No data to display ____________________________________________  EKG  none  ____________________________________________  RADIOLOGY  none  ____________________________________________   PROCEDURES  Procedure(s) performed: None Procedures Critical Care performed:  None ____________________________________________   INITIAL IMPRESSION / ASSESSMENT AND PLAN / ED COURSE   68 y.o. female with several comorbidities as listed below who presents for evaluation of right-sided chest and upper back pain.  Patient with rash consistent with shingles at the location of the pain.  Rash is vesicular in a dermatomal distribution.  No evidence of superinfection.  Patient was started on Valtrex and gabapentin.  Referred to PCP for follow-up.  Discussed my standard return precautions.       As part of my medical decision making, I reviewed the following data within the [place] notes reviewed and incorporated, Old chart reviewed, Notes from prior ED visits and [place] Controlled Substance Database   Patient was evaluated in Emergency Department today for the symptoms described in the history of present illness. Patient was evaluated in the context of the global COVID-19 pandemic, which necessitated consideration that the patient might be at risk for infection with the SARS-CoV-2 virus  that causes COVID-19. Institutional protocols and algorithms that pertain to the evaluation of patients at risk for COVID-19 are in a state of rapid change based on information released by regulatory bodies including the CDC and federal and state organizations. These policies and algorithms were followed during the patient's care in the ED.   ____________________________________________   FINAL CLINICAL IMPRESSION(S) / ED DIAGNOSES   Final diagnoses:  Herpes zoster without complication      NEW MEDICATIONS STARTED DURING THIS VISIT:  ED Discharge Orders         Ordered    valACYclovir (VALTREX) 500 MG tablet  Daily     03/24/19 0559    gabapentin (NEURONTIN) 100 MG capsule  3 times daily     03/24/19 0559           Note:  This document was prepared using Dragon voice recognition software and may include unintentional dictation errors.    Rudene Re, MD 03/24/19 [id number]

## 2019-03-24 NOTE — ED Triage Notes (Addendum)
Patient reports having right mid back/flank pain for the past 6 days.  Seen at [facility] Urgent care and disagnosed with acute mid back pain.  Patient reports that clothing hurts to touch skin.  When shirt pulled up patient with linear rash on right back and round to right breast.

## 2019-03-25 ENCOUNTER — Ambulatory Visit (INDEPENDENT_AMBULATORY_CARE_PROVIDER_SITE_OTHER): Payer: Medicare HMO | Admitting: *Deleted

## 2019-03-25 DIAGNOSIS — I6310 Cerebral infarction due to embolism of unspecified precerebral artery: Secondary | ICD-10-CM | POA: Diagnosis not present

## 2019-03-28 ENCOUNTER — Other Ambulatory Visit: Payer: Self-pay

## 2019-03-28 ENCOUNTER — Encounter: Payer: Self-pay | Admitting: Emergency Medicine

## 2019-03-28 ENCOUNTER — Emergency Department
Admission: EM | Admit: 2019-03-28 | Discharge: 2019-03-28 | Disposition: A | Discharge status: Home / Self Care | Payer: Medicare HMO | Attending: Emergency Medicine | Admitting: Emergency Medicine

## 2019-03-28 DIAGNOSIS — Z992 Dependence on renal dialysis: Secondary | ICD-10-CM | POA: Insufficient documentation

## 2019-03-28 DIAGNOSIS — Z79899 Other long term (current) drug therapy: Secondary | ICD-10-CM | POA: Diagnosis not present

## 2019-03-28 DIAGNOSIS — E1122 Type 2 diabetes mellitus with diabetic chronic kidney disease: Secondary | ICD-10-CM | POA: Insufficient documentation

## 2019-03-28 DIAGNOSIS — I120 Hypertensive chronic kidney disease with stage 5 chronic kidney disease or end stage renal disease: Secondary | ICD-10-CM | POA: Diagnosis not present

## 2019-03-28 DIAGNOSIS — I2510 Atherosclerotic heart disease of native coronary artery without angina pectoris: Secondary | ICD-10-CM | POA: Insufficient documentation

## 2019-03-28 DIAGNOSIS — Z87891 Personal history of nicotine dependence: Secondary | ICD-10-CM | POA: Diagnosis not present

## 2019-03-28 DIAGNOSIS — Z8673 Personal history of transient ischemic attack (TIA), and cerebral infarction without residual deficits: Secondary | ICD-10-CM | POA: Insufficient documentation

## 2019-03-28 DIAGNOSIS — Z794 Long term (current) use of insulin: Secondary | ICD-10-CM | POA: Diagnosis not present

## 2019-03-28 DIAGNOSIS — J449 Chronic obstructive pulmonary disease, unspecified: Secondary | ICD-10-CM | POA: Diagnosis not present

## 2019-03-28 DIAGNOSIS — B029 Zoster without complications: Secondary | ICD-10-CM | POA: Diagnosis not present

## 2019-03-28 DIAGNOSIS — Z7982 Long term (current) use of aspirin: Secondary | ICD-10-CM | POA: Insufficient documentation

## 2019-03-28 DIAGNOSIS — N186 End stage renal disease: Secondary | ICD-10-CM | POA: Insufficient documentation

## 2019-03-28 MED ORDER — ACETAMINOPHEN 500 MG PO TABS
1000.0000 mg | ORAL_TABLET | Freq: Once | ORAL | Status: AC
  Administered 2019-03-28: 07:00:00 1000 mg via ORAL
  Filled 2019-03-28: qty 2

## 2019-03-28 MED ORDER — GABAPENTIN 100 MG PO CAPS: 200.0000 mg | ORAL_CAPSULE | Freq: Three times a day (TID) | ORAL | 0 refills | Status: DC

## 2019-03-28 MED ORDER — GABAPENTIN 100 MG PO CAPS
200.0000 mg | ORAL_CAPSULE | Freq: Once | ORAL | Status: AC
  Administered 2019-03-28: 200 mg via ORAL
  Filled 2019-03-28: qty 2

## 2019-03-28 NOTE — ED Provider Notes (Signed)
[facility] Emergency Department Provider Note  ____________________________________________  Time seen: Approximately 6:05 AM  I have reviewed the triage vital signs and the nursing notes.   HISTORY  Chief Complaint Herpes Zoster   HPI Cynthia Dean is a 68 y.o. female seen here 4 days ago and diagnosed with shingles who presents for evaluation of persistent pain.  Patient is on Valtrex and gabapentin 100 mg 3 times daily for pain.  She is complaining of severe burning pain located in the right chest wall and right upper back.  The pain is constant and nonradiating.  She has a vesicular rash in that location.  No fever or chills, no cough, no shortness of breath.   Past Medical History:  Diagnosis Date  . COPD (chronic obstructive pulmonary disease) ([place])   . Coronary artery disease   . Diabetes mellitus without complication ([place])   . ESRD on dialysis ([place])   . Heart murmur   . Hyperlipidemia   . Hypertension   . Stroke [facility])     Patient Active Problem List   Diagnosis Date Noted  . Rectal bleed 02/13/2019  . Acute respiratory failure with hypoxia ([place]) 12/31/2018  . Nausea & vomiting 05/30/2018  . Nausea and vomiting 05/29/2018  . Partial seizure ([place]) 03/14/2018  . Stroke ([place]) 03/07/2018  . Slurred speech 11/14/2017  . Diabetes ([place]) 11/12/2017  . CAD (coronary artery disease) 11/12/2017  . COPD (chronic obstructive pulmonary disease) ([place]) 11/12/2017  . ESRD on dialysis ([place]) 11/12/2017  . Hematemesis 06/27/2017  . CVA (cerebral vascular accident) ([place]) 04/14/2016  . Essential hypertension, benign 11/27/2015  . Hyperlipidemia 11/27/2015  . Obesity 11/27/2015  . Prolonged Q-T interval on ECG 11/26/2015  . Intractable nausea and vomiting 08/08/2015    Past Surgical History:  Procedure Laterality Date  . A/V FISTULAGRAM Left 12/20/2016   Procedure: A/V Fistulagram;  Surgeon: Katha Cabal, MD;  Location: [place] [facility]  [facility];  Service: Cardiovascular;  Laterality: Left;  . A/V SHUNT INTERVENTION N/A 12/20/2016   Procedure: A/V Shunt Intervention;  Surgeon: Katha Cabal, MD;  Location: [place] [facility];  Service: Cardiovascular;  Laterality: N/A;  . A/V SHUNTOGRAM Left 09/11/2017   Procedure: A/V SHUNTOGRAM;  Surgeon: Algernon Huxley, MD;  Location: [place] [facility];  Service: Cardiovascular;  Laterality: Left;  . BREAST BIOPSY Bilateral 07/19/00   neg  . COLONOSCOPY WITH PROPOFOL N/A 02/16/2019   Procedure: COLONOSCOPY WITH PROPOFOL;  Surgeon: Toledo, Benay Pike, MD;  Location: [facility] ENDOSCOPY;  Service: Gastroenterology;  Laterality: N/A;  . ESOPHAGOGASTRODUODENOSCOPY (EGD) WITH PROPOFOL N/A 02/16/2019   Procedure: ESOPHAGOGASTRODUODENOSCOPY (EGD) WITH PROPOFOL;  Surgeon: Toledo, Benay Pike, MD;  Location: [facility] ENDOSCOPY;  Service: Gastroenterology;  Laterality: N/A;  . LOOP RECORDER INSERTION N/A 11/16/2017   Procedure: LOOP RECORDER INSERTION;  Surgeon: Deboraha Sprang, MD;  Location: [place] [facility];  Service: Cardiovascular;  Laterality: N/A;  . PERIPHERAL VASCULAR CATHETERIZATION Left 02/02/2015   Procedure: A/V Shuntogram/Fistulagram;  Surgeon: Algernon Huxley, MD;  Location: [place] [facility];  Service: Cardiovascular;  Laterality: Left;  . PERIPHERAL VASCULAR CATHETERIZATION Left 02/02/2015   Procedure: A/V Shunt Intervention;  Surgeon: Algernon Huxley, MD;  Location: [place] [facility];  Service: Cardiovascular;  Laterality: Left;  . PERIPHERAL VASCULAR CATHETERIZATION Left 03/09/2015   Procedure: A/V Shuntogram/Fistulagram;  Surgeon: Algernon Huxley, MD;  Location: [place] [facility];  Service: Cardiovascular;  Laterality: Left;  . PERIPHERAL VASCULAR CATHETERIZATION N/A 03/09/2015  Procedure: A/V Shunt Intervention;  Surgeon: Algernon Huxley, MD;  Location: [place] [facility];  Service: Cardiovascular;  Laterality: N/A;  . PERIPHERAL VASCULAR CATHETERIZATION Left 04/17/2015   Procedure: Upper Extremity  Angiography;  Surgeon: Algernon Huxley, MD;  Location: [place] [facility];  Service: Cardiovascular;  Laterality: Left;  . PERIPHERAL VASCULAR CATHETERIZATION  04/17/2015   Procedure: Upper Extremity Intervention;  Surgeon: Algernon Huxley, MD;  Location: [place] [facility];  Service: Cardiovascular;;  . PERIPHERAL VASCULAR CATHETERIZATION N/A 08/10/2015   Procedure: A/V Shuntogram/Fistulagram;  Surgeon: Algernon Huxley, MD;  Location: [place] [facility];  Service: Cardiovascular;  Laterality: N/A;  . PERIPHERAL VASCULAR CATHETERIZATION N/A 08/10/2015   Procedure: A/V Shunt Intervention;  Surgeon: Algernon Huxley, MD;  Location: [place] [facility];  Service: Cardiovascular;  Laterality: N/A;  . PERIPHERAL VASCULAR CATHETERIZATION Left 04/11/2016   Procedure: A/V Shuntogram/Fistulagram;  Surgeon: Algernon Huxley, MD;  Location: [place] [facility];  Service: Cardiovascular;  Laterality: Left;  . TEE WITHOUT CARDIOVERSION N/A 11/16/2017   Procedure: TRANSESOPHAGEAL ECHOCARDIOGRAM (TEE);  Surgeon: Minna Merritts, MD;  Location: [facility] ORS;  Service: Cardiovascular;  Laterality: N/A;    Prior to Admission medications   Medication Sig Start Date End Date Taking? Authorizing Provider  acetaminophen (TYLENOL) 325 MG tablet Take 2 tablets (650 mg total) by mouth every 4 (four) hours as needed for mild pain (or temp > 37.5 C (99.5 F)). 11/17/17   Gouru, Illene Silver, MD  amLODipine (NORVASC) 5 MG tablet Take 5 mg by mouth 2 (two) times daily. 08/07/18   [provider]  aspirin EC 81 MG tablet Take 1 tablet (81 mg total) by mouth daily. 02/16/19   Gladstone Lighter, MD  atorvastatin (LIPITOR) 80 MG tablet Take 80 mg by mouth every evening.     [provider]  calcium acetate (PHOSLO) 667 MG capsule Take 2,001 mg by mouth 3 (three) times daily. 01/21/19   [provider]  clopidogrel (PLAVIX) 75 MG tablet TAKE ONE TABLET BY MOUTH ONCE DAILY Patient taking differently: Take 75 mg by mouth daily.   06/14/16   Stegmayer, Joelene Millin A, PA-C  diclofenac sodium (VOLTAREN) 1 % GEL Apply 4 g topically 4 (four) times daily. 03/23/19   Gertie Baron, NP  gabapentin (NEURONTIN) 100 MG capsule Take 2 capsules (200 mg total) by mouth 3 (three) times daily. 03/28/19 03/27/20  Rudene Re, MD  insulin NPH-regular Human (NOVOLIN 70/30) (70-30) 100 UNIT/ML injection Inject 25 Units into the skin 2 (two) times daily with a meal. Except dialysis days. Do not take on Tuesdays, Thursdays, and Saturdays. 11/17/17   Nicholes Mango, MD  levETIRAcetam (KEPPRA) 1000 MG tablet Take 1 tablet (1,000 mg total) by mouth daily. 03/15/18   Vaughan Basta, MD  levETIRAcetam (KEPPRA) 250 MG tablet Take 1 tablet (250 mg total) by mouth 3 (three) times a week. Take after dialysis on Monday, wed and Friday in addition to regular dose. 03/14/18   Vaughan Basta, MD  lidocaine-prilocaine (EMLA) cream Apply 1 application topically as needed (for port access).     [provider]  losartan (COZAAR) 100 MG tablet Take 100 mg by mouth at bedtime. Takes in the morning 09/12/17   [provider]  metoprolol succinate (TOPROL-XL) 100 MG 24 hr tablet Take 100 mg by mouth daily. Take with or immediately following a meal.     [provider]  omeprazole (PRILOSEC) 20 MG capsule Take 20 mg by mouth 2 (two)  times daily.     [provider]  torsemide (DEMADEX) 100 MG tablet Take 100 mg by mouth daily. 10/10/17   [provider]  valACYclovir (VALTREX) 500 MG tablet Take 1 tablet (500 mg total) by mouth daily for 10 days. 03/24/19 04/03/19  Rudene Re, MD    Allergies Hydrocodone, Tape, and Tapentadol  Family History  Problem Relation Age of Onset  . Breast cancer Father 24  . Stroke Mother   . Heart attack Mother   . Heart Problems Sister   . Breast cancer Cousin 9       1 st cousin. Maternal     Social History Social History   Tobacco Use  . Smoking status: Former  Smoker    Packs/day: 0.00    Types: Cigarettes  . Smokeless tobacco: Never Used  Substance Use Topics  . Alcohol use: No  . Drug use: No    Review of Systems  Constitutional: Negative for fever. Eyes: Negative for visual changes. ENT: Negative for sore throat. Neck: No neck pain  Cardiovascular: Negative for chest pain. + R chest wall pain Respiratory: Negative for shortness of breath. Gastrointestinal: Negative for abdominal pain, vomiting or diarrhea. Genitourinary: Negative for dysuria. Musculoskeletal: + R upper back pain. Skin: + rash. Neurological: Negative for headaches, weakness or numbness. Psych: No SI or HI  ____________________________________________   PHYSICAL EXAM:  VITAL SIGNS: ED Triage Vitals  Enc Vitals Group     BP 03/28/19 0552 (!) 172/71     Pulse --      Resp 03/28/19 0552 18     Temp 03/28/19 0552 98 F (36.7 C)     Temp Source 03/28/19 0552 Oral     SpO2 03/28/19 0552 99 %     Weight 03/28/19 0553 230 lb (104.3 kg)     Height 03/28/19 0553 5\' 9"  (1.753 m)     Head Circumference --      Peak Flow --      Pain Score 03/28/19 0549 10     Pain Loc --      Pain Edu? --      Excl. in [place]? --     Constitutional: Alert and oriented. Well appearing and in no apparent distress. HEENT:      Head: Normocephalic and atraumatic.         Eyes: Conjunctivae are normal. Sclera is non-icteric.       Mouth/Throat: Mucous membranes are moist.       Neck: Supple with no signs of meningismus. Cardiovascular: Regular rate and rhythm. No murmurs, gallops, or rubs. 2+ symmetrical distal pulses are present in all extremities. No JVD. Respiratory: Normal respiratory effort. Lungs are clear to auscultation bilaterally. No wheezes, crackles, or rhonchi.  Gastrointestinal: Soft, non tender, and non distended with positive bowel sounds. No rebound or guarding. Musculoskeletal: Nontender with normal range of motion in all extremities. No edema, cyanosis, or erythema  of extremities. Neurologic: Normal speech and language. Face is symmetric. Moving all extremities. No gross focal neurologic deficits are appreciated. Skin: Skin is warm, dry and intact.  Vesicular rash in a dermatomal distribution on an erythematous base Psychiatric: Mood and affect are normal. Speech and behavior are normal.  ____________________________________________   LABS (all labs ordered are listed, but only abnormal results are displayed)  Labs Reviewed - No data to display ____________________________________________  EKG  none  ____________________________________________  RADIOLOGY  none  ____________________________________________   PROCEDURES  Procedure(s) performed: None Procedures Critical Care performed:  None ____________________________________________   INITIAL IMPRESSION / ASSESSMENT AND PLAN / ED COURSE  68 y.o. female seen here 4 days ago and diagnosed with shingles who presents for evaluation of persistent pain.  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  Currently on gabapentin 100 mg 3 times daily, will increase to 200 mg 3 times daily.  Recommended increasing to 300 mg 3 times daily in 2 days if the pain is still severe.  She is also has been on Valtrex for the last 4 days.  Also taking Tylenol standing 3 times a day.  Discussed follow-up with PCP and my standard return precautions.       As part of my medical decision making, I reviewed the following data within the [place] notes reviewed and incorporated, Old chart reviewed, Notes from prior ED visits and [place] Controlled Substance Database   Patient was evaluated in Emergency Department today for the symptoms described in the history of present illness. Patient was evaluated in the context of the global COVID-19 pandemic, which necessitated consideration that the patient might be at risk for infection with the SARS-CoV-2 virus that causes COVID-19. Institutional  protocols and algorithms that pertain to the evaluation of patients at risk for COVID-19 are in a state of rapid change based on information released by regulatory bodies including the CDC and federal and state organizations. These policies and algorithms were followed during the patient's care in the ED.   ____________________________________________   FINAL CLINICAL IMPRESSION(S) / ED DIAGNOSES   Final diagnoses:  Herpes zoster without complication      NEW MEDICATIONS STARTED DURING THIS VISIT:  ED Discharge Orders         Ordered    gabapentin (NEURONTIN) 100 MG capsule  3 times daily     03/28/19 0603           Note:  This document was prepared using Dragon voice recognition software and may include unintentional dictation errors.    Alfred Levins, [place], MD 03/28/19 [id number]

## 2019-03-28 NOTE — Discharge Instructions (Addendum)
Continue to take Valtrex as prescribed. For pain take 1000mg  of tylenol 3 times a day and increase the gabapentin to 200mg  three times a day. In two days, if the pain is still too bad, you may increase the gabapentin to 300mg  three times a day. Follow up with your doctor if the pain is not improving.

## 2019-03-28 NOTE — ED Triage Notes (Signed)
Pt to triage via w/c with no distress noted, mask in place; pt reports seen on Sunday and dx with shingles but c/o persistent pain to rt side breast/back

## 2019-04-10 NOTE — Progress Notes (Signed)
[facility]link Summary Report / Loop Recorder 

## 2019-04-25 ENCOUNTER — Ambulatory Visit (INDEPENDENT_AMBULATORY_CARE_PROVIDER_SITE_OTHER): Payer: Medicare HMO | Admitting: *Deleted

## 2019-04-25 DIAGNOSIS — I6310 Cerebral infarction due to embolism of unspecified precerebral artery: Secondary | ICD-10-CM | POA: Diagnosis not present

## 2019-04-27 LAB — CUP PACEART REMOTE DEVICE CHECK
Date Time Interrogation Session: 20201001214606
Implantable Pulse Generator Implant Date: 20190425

## 2019-05-02 NOTE — Progress Notes (Signed)
[facility]link Summary Report / Loop Recorder 

## 2019-05-28 ENCOUNTER — Ambulatory Visit (INDEPENDENT_AMBULATORY_CARE_PROVIDER_SITE_OTHER): Payer: Medicare HMO | Admitting: *Deleted

## 2019-05-28 DIAGNOSIS — I6310 Cerebral infarction due to embolism of unspecified precerebral artery: Secondary | ICD-10-CM | POA: Diagnosis not present

## 2019-05-29 LAB — CUP PACEART REMOTE DEVICE CHECK
Date Time Interrogation Session: 20201103214922
Implantable Pulse Generator Implant Date: 20190425

## 2019-06-17 NOTE — Progress Notes (Signed)
[facility]link Summary Report / Loop Recorder 

## 2019-07-01 ENCOUNTER — Ambulatory Visit (INDEPENDENT_AMBULATORY_CARE_PROVIDER_SITE_OTHER): Payer: Medicare HMO | Admitting: *Deleted

## 2019-07-01 DIAGNOSIS — I6310 Cerebral infarction due to embolism of unspecified precerebral artery: Secondary | ICD-10-CM | POA: Diagnosis not present

## 2019-07-02 LAB — CUP PACEART REMOTE DEVICE CHECK
Date Time Interrogation Session: 20201208074731
Implantable Pulse Generator Implant Date: 20190425

## 2019-07-30 ENCOUNTER — Emergency Department: Payer: Medicare HMO

## 2019-07-30 ENCOUNTER — Inpatient Hospital Stay
Admission: EM | Admit: 2019-07-30 | Discharge: 2019-08-11 | DRG: 177 | Disposition: A | Discharge status: Home Health | Payer: Medicare HMO | Attending: Internal Medicine | Admitting: Internal Medicine

## 2019-07-30 DIAGNOSIS — R569 Unspecified convulsions: Secondary | ICD-10-CM

## 2019-07-30 DIAGNOSIS — Z885 Allergy status to narcotic agent status: Secondary | ICD-10-CM

## 2019-07-30 DIAGNOSIS — N186 End stage renal disease: Secondary | ICD-10-CM | POA: Diagnosis present

## 2019-07-30 DIAGNOSIS — D631 Anemia in chronic kidney disease: Secondary | ICD-10-CM | POA: Diagnosis present

## 2019-07-30 DIAGNOSIS — I6310 Cerebral infarction due to embolism of unspecified precerebral artery: Secondary | ICD-10-CM | POA: Diagnosis not present

## 2019-07-30 DIAGNOSIS — E1122 Type 2 diabetes mellitus with diabetic chronic kidney disease: Secondary | ICD-10-CM | POA: Diagnosis present

## 2019-07-30 DIAGNOSIS — E1110 Type 2 diabetes mellitus with ketoacidosis without coma: Secondary | ICD-10-CM | POA: Diagnosis present

## 2019-07-30 DIAGNOSIS — E876 Hypokalemia: Secondary | ICD-10-CM | POA: Diagnosis not present

## 2019-07-30 DIAGNOSIS — E785 Hyperlipidemia, unspecified: Secondary | ICD-10-CM | POA: Diagnosis present

## 2019-07-30 DIAGNOSIS — Z7982 Long term (current) use of aspirin: Secondary | ICD-10-CM

## 2019-07-30 DIAGNOSIS — R197 Diarrhea, unspecified: Secondary | ICD-10-CM | POA: Diagnosis present

## 2019-07-30 DIAGNOSIS — J440 Chronic obstructive pulmonary disease with acute lower respiratory infection: Secondary | ICD-10-CM | POA: Diagnosis present

## 2019-07-30 DIAGNOSIS — Z6841 Body Mass Index (BMI) 40.0 and over, adult: Secondary | ICD-10-CM

## 2019-07-30 DIAGNOSIS — E875 Hyperkalemia: Secondary | ICD-10-CM | POA: Diagnosis present

## 2019-07-30 DIAGNOSIS — I10 Essential (primary) hypertension: Secondary | ICD-10-CM | POA: Diagnosis not present

## 2019-07-30 DIAGNOSIS — U071 COVID-19: Principal | ICD-10-CM

## 2019-07-30 DIAGNOSIS — E11319 Type 2 diabetes mellitus with unspecified diabetic retinopathy without macular edema: Secondary | ICD-10-CM | POA: Diagnosis present

## 2019-07-30 DIAGNOSIS — F329 Major depressive disorder, single episode, unspecified: Secondary | ICD-10-CM | POA: Diagnosis not present

## 2019-07-30 DIAGNOSIS — I5033 Acute on chronic diastolic (congestive) heart failure: Secondary | ICD-10-CM | POA: Diagnosis present

## 2019-07-30 DIAGNOSIS — J1282 Pneumonia due to coronavirus disease 2019: Secondary | ICD-10-CM | POA: Diagnosis present

## 2019-07-30 DIAGNOSIS — G9341 Metabolic encephalopathy: Secondary | ICD-10-CM | POA: Diagnosis present

## 2019-07-30 DIAGNOSIS — Z79899 Other long term (current) drug therapy: Secondary | ICD-10-CM

## 2019-07-30 DIAGNOSIS — I2510 Atherosclerotic heart disease of native coronary artery without angina pectoris: Secondary | ICD-10-CM | POA: Diagnosis present

## 2019-07-30 DIAGNOSIS — G4733 Obstructive sleep apnea (adult) (pediatric): Secondary | ICD-10-CM | POA: Diagnosis present

## 2019-07-30 DIAGNOSIS — Z992 Dependence on renal dialysis: Secondary | ICD-10-CM

## 2019-07-30 DIAGNOSIS — J9621 Acute and chronic respiratory failure with hypoxia: Secondary | ICD-10-CM

## 2019-07-30 DIAGNOSIS — J9600 Acute respiratory failure, unspecified whether with hypoxia or hypercapnia: Secondary | ICD-10-CM | POA: Diagnosis present

## 2019-07-30 DIAGNOSIS — I132 Hypertensive heart and chronic kidney disease with heart failure and with stage 5 chronic kidney disease, or end stage renal disease: Secondary | ICD-10-CM | POA: Diagnosis present

## 2019-07-30 DIAGNOSIS — R531 Weakness: Secondary | ICD-10-CM

## 2019-07-30 DIAGNOSIS — J9601 Acute respiratory failure with hypoxia: Secondary | ICD-10-CM

## 2019-07-30 DIAGNOSIS — I5031 Acute diastolic (congestive) heart failure: Secondary | ICD-10-CM | POA: Diagnosis not present

## 2019-07-30 DIAGNOSIS — G40909 Epilepsy, unspecified, not intractable, without status epilepticus: Secondary | ICD-10-CM | POA: Diagnosis present

## 2019-07-30 DIAGNOSIS — R0602 Shortness of breath: Secondary | ICD-10-CM

## 2019-07-30 DIAGNOSIS — N2581 Secondary hyperparathyroidism of renal origin: Secondary | ICD-10-CM | POA: Diagnosis present

## 2019-07-30 DIAGNOSIS — Z803 Family history of malignant neoplasm of breast: Secondary | ICD-10-CM

## 2019-07-30 DIAGNOSIS — Z8673 Personal history of transient ischemic attack (TIA), and cerebral infarction without residual deficits: Secondary | ICD-10-CM

## 2019-07-30 DIAGNOSIS — G9340 Encephalopathy, unspecified: Secondary | ICD-10-CM | POA: Diagnosis not present

## 2019-07-30 DIAGNOSIS — Z888 Allergy status to other drugs, medicaments and biological substances status: Secondary | ICD-10-CM

## 2019-07-30 DIAGNOSIS — Z823 Family history of stroke: Secondary | ICD-10-CM

## 2019-07-30 DIAGNOSIS — R0902 Hypoxemia: Secondary | ICD-10-CM

## 2019-07-30 DIAGNOSIS — F05 Delirium due to known physiological condition: Secondary | ICD-10-CM | POA: Diagnosis not present

## 2019-07-30 DIAGNOSIS — Z8616 Personal history of COVID-19: Secondary | ICD-10-CM

## 2019-07-30 DIAGNOSIS — Z9981 Dependence on supplemental oxygen: Secondary | ICD-10-CM

## 2019-07-30 DIAGNOSIS — Z794 Long term (current) use of insulin: Secondary | ICD-10-CM

## 2019-07-30 DIAGNOSIS — Z91048 Other nonmedicinal substance allergy status: Secondary | ICD-10-CM

## 2019-07-30 DIAGNOSIS — Z7902 Long term (current) use of antithrombotics/antiplatelets: Secondary | ICD-10-CM

## 2019-07-30 DIAGNOSIS — Z87891 Personal history of nicotine dependence: Secondary | ICD-10-CM

## 2019-07-30 DIAGNOSIS — Z8249 Family history of ischemic heart disease and other diseases of the circulatory system: Secondary | ICD-10-CM

## 2019-07-30 HISTORY — DX: Acute and chronic respiratory failure with hypoxia: J96.21

## 2019-07-30 HISTORY — DX: Gastrointestinal hemorrhage, unspecified: K92.2

## 2019-07-30 HISTORY — DX: Cerebral infarction, unspecified: I63.9

## 2019-07-30 HISTORY — DX: Personal history of COVID-19: Z86.16

## 2019-07-30 HISTORY — DX: Disorder of arteries and arterioles, unspecified: I77.9

## 2019-07-30 LAB — COMPREHENSIVE METABOLIC PANEL
ALT: 16 U/L (ref 0–44)
AST: 17 U/L (ref 15–41)
Albumin: 3.5 g/dL (ref 3.5–5.0)
Alkaline Phosphatase: 83 U/L (ref 38–126)
Anion gap: 19 — ABNORMAL HIGH (ref 5–15)
BUN: 84 mg/dL — ABNORMAL HIGH (ref 8–23)
CO2: 18 mmol/L — ABNORMAL LOW (ref 22–32)
Calcium: 8.2 mg/dL — ABNORMAL LOW (ref 8.9–10.3)
Chloride: 97 mmol/L — ABNORMAL LOW (ref 98–111)
Creatinine, Ser: 16.5 mg/dL — ABNORMAL HIGH (ref 0.44–1.00)
GFR calc Af Amer: 2 mL/min — ABNORMAL LOW (ref >60)
GFR calc non Af Amer: 2 mL/min — ABNORMAL LOW (ref >60)
Glucose, Bld: 223 mg/dL — ABNORMAL HIGH (ref 70–99)
Potassium: 7.3 mmol/L (ref 3.5–5.1)
Sodium: 134 mmol/L — ABNORMAL LOW (ref 135–145)
Total Bilirubin: 0.7 mg/dL (ref 0.3–1.2)
Total Protein: 7.3 g/dL (ref 6.5–8.1)

## 2019-07-30 LAB — BLOOD GAS, VENOUS
Acid-base deficit: 5.2 mmol/L — ABNORMAL HIGH (ref 0.0–2.0)
Bicarbonate: 21.6 mmol/L (ref 20.0–28.0)
O2 Saturation: 75.3 %
Patient temperature: 37
pCO2, Ven: 46 mmHg (ref 44.0–60.0)
pH, Ven: 7.28 (ref 7.250–7.430)
pO2, Ven: 46 mmHg — ABNORMAL HIGH (ref 32.0–45.0)

## 2019-07-30 LAB — POC SARS CORONAVIRUS 2 AG -  ED: SARS Coronavirus 2 Ag: POSITIVE — AB

## 2019-07-30 LAB — C-REACTIVE PROTEIN: CRP: 3.2 mg/dL — ABNORMAL HIGH (ref <1.0)

## 2019-07-30 LAB — CBC WITH DIFFERENTIAL/PLATELET
Abs Immature Granulocytes: 0.03 10*3/uL (ref 0.00–0.07)
Basophils Absolute: 0 10*3/uL (ref 0.0–0.1)
Basophils Relative: 0 %
Eosinophils Absolute: 0 10*3/uL (ref 0.0–0.5)
Eosinophils Relative: 0 %
HCT: 34.3 % — ABNORMAL LOW (ref 36.0–46.0)
Hemoglobin: 10.8 g/dL — ABNORMAL LOW (ref 12.0–15.0)
Immature Granulocytes: 0 %
Lymphocytes Relative: 11 %
Lymphs Abs: 0.9 10*3/uL (ref 0.7–4.0)
MCH: 27.6 pg (ref 26.0–34.0)
MCHC: 31.5 g/dL (ref 30.0–36.0)
MCV: 87.7 fL (ref 80.0–100.0)
Monocytes Absolute: 0.8 10*3/uL (ref 0.1–1.0)
Monocytes Relative: 10 %
Neutro Abs: 6.2 10*3/uL (ref 1.7–7.7)
Neutrophils Relative %: 79 %
Platelets: 160 10*3/uL (ref 150–400)
RBC: 3.91 MIL/uL (ref 3.87–5.11)
RDW: 14.7 % (ref 11.5–15.5)
WBC: 7.9 10*3/uL (ref 4.0–10.5)
nRBC: 0 % (ref 0.0–0.2)

## 2019-07-30 LAB — PROTIME-INR
INR: 1.1 (ref 0.8–1.2)
Prothrombin Time: 14.1 seconds (ref 11.4–15.2)

## 2019-07-30 LAB — TROPONIN I (HIGH SENSITIVITY)
Troponin I (High Sensitivity): 22 ng/L — ABNORMAL HIGH (ref <18)
Troponin I (High Sensitivity): 23 ng/L — ABNORMAL HIGH (ref <18)

## 2019-07-30 LAB — ABO/RH: ABO/RH(D): A POS

## 2019-07-30 LAB — BRAIN NATRIURETIC PEPTIDE: B Natriuretic Peptide: 451 pg/mL — ABNORMAL HIGH (ref 0.0–100.0)

## 2019-07-30 LAB — LACTATE DEHYDROGENASE: LDH: 259 U/L — ABNORMAL HIGH (ref 98–192)

## 2019-07-30 LAB — PROCALCITONIN: Procalcitonin: 1.03 ng/mL

## 2019-07-30 LAB — FERRITIN: Ferritin: 1360 ng/mL — ABNORMAL HIGH (ref 11–307)

## 2019-07-30 MED ORDER — PENTAFLUOROPROP-TETRAFLUOROETH EX AERO
1.0000 "application " | INHALATION_SPRAY | CUTANEOUS | Status: DC | PRN
  Filled 2019-07-30: qty 30

## 2019-07-30 MED ORDER — SODIUM ZIRCONIUM CYCLOSILICATE 10 G PO PACK
10.0000 g | PACK | Freq: Once | ORAL | Status: DC
  Filled 2019-07-30: qty 1

## 2019-07-30 MED ORDER — HEPARIN SODIUM (PORCINE) 1000 UNIT/ML DIALYSIS
100.0000 [IU]/kg | INTRAMUSCULAR | Status: DC | PRN
  Filled 2019-07-30: qty 11

## 2019-07-30 MED ORDER — CLOPIDOGREL BISULFATE 75 MG PO TABS
75.0000 mg | ORAL_TABLET | Freq: Every day | ORAL | Status: DC
  Administered 2019-07-31 – 2019-08-01 (×2): 75 mg via ORAL
  Filled 2019-07-30 (×4): qty 1

## 2019-07-30 MED ORDER — INSULIN ASPART 100 UNIT/ML ~~LOC~~ SOLN
SUBCUTANEOUS | Status: AC
  Filled 2019-07-30: qty 1

## 2019-07-30 MED ORDER — TORSEMIDE 100 MG PO TABS
100.0000 mg | ORAL_TABLET | Freq: Every day | ORAL | Status: DC
  Administered 2019-07-31 – 2019-08-02 (×2): 100 mg via ORAL
  Filled 2019-07-30 (×4): qty 1

## 2019-07-30 MED ORDER — ASPIRIN EC 325 MG PO TBEC
325.0000 mg | DELAYED_RELEASE_TABLET | Freq: Every day | ORAL | Status: DC
  Administered 2019-07-31 – 2019-08-01 (×2): 325 mg via ORAL
  Filled 2019-07-30 (×3): qty 1

## 2019-07-30 MED ORDER — MAGNESIUM HYDROXIDE 400 MG/5ML PO SUSP: 30.0000 mL | Freq: Every day | ORAL | Status: DC | PRN

## 2019-07-30 MED ORDER — SODIUM CHLORIDE 0.9 % IV SOLN: 100.0000 mL | INTRAVENOUS | Status: DC | PRN

## 2019-07-30 MED ORDER — HEPARIN SODIUM (PORCINE) 5000 UNIT/ML IJ SOLN
5000.0000 [IU] | Freq: Two times a day (BID) | INTRAMUSCULAR | Status: DC
  Administered 2019-07-31 – 2019-08-01 (×4): 5000 [IU] via SUBCUTANEOUS
  Filled 2019-07-30 (×8): qty 1

## 2019-07-30 MED ORDER — INSULIN DETEMIR 100 UNIT/ML ~~LOC~~ SOLN
0.1500 [IU]/kg | Freq: Two times a day (BID) | SUBCUTANEOUS | Status: DC
  Administered 2019-07-31 (×2): 16 [IU] via SUBCUTANEOUS
  Filled 2019-07-30 (×5): qty 0.16

## 2019-07-30 MED ORDER — FAMOTIDINE 20 MG PO TABS
20.0000 mg | ORAL_TABLET | Freq: Two times a day (BID) | ORAL | Status: DC
  Administered 2019-07-31 (×2): 20 mg via ORAL
  Filled 2019-07-30 (×2): qty 1

## 2019-07-30 MED ORDER — DEXAMETHASONE SODIUM PHOSPHATE 10 MG/ML IJ SOLN
8.0000 mg | Freq: Once | INTRAMUSCULAR | Status: AC
  Administered 2019-07-30: 8 mg via INTRAVENOUS
  Filled 2019-07-30: qty 1

## 2019-07-30 MED ORDER — SODIUM CHLORIDE 0.9 % IV SOLN: 100.0000 mg | Freq: Every day | INTRAVENOUS | Status: DC

## 2019-07-30 MED ORDER — DEXAMETHASONE SODIUM PHOSPHATE 4 MG/ML IJ SOLN
6.0000 mg | INTRAMUSCULAR | Status: DC
  Administered 2019-07-31 – 2019-08-04 (×4): 6 mg via INTRAVENOUS
  Filled 2019-07-30 (×2): qty 1.5
  Filled 2019-07-30: qty 2
  Filled 2019-07-30 (×2): qty 1.5

## 2019-07-30 MED ORDER — SODIUM BICARBONATE 8.4 % IV SOLN
50.0000 meq | Freq: Once | INTRAVENOUS | Status: AC
  Administered 2019-07-30: 50 meq via INTRAVENOUS
  Filled 2019-07-30: qty 50

## 2019-07-30 MED ORDER — INSULIN ASPART PROT & ASPART (70-30 MIX) 100 UNIT/ML ~~LOC~~ SUSP: 25.0000 [IU] | Freq: Two times a day (BID) | SUBCUTANEOUS | Status: DC

## 2019-07-30 MED ORDER — ONDANSETRON HCL 4 MG/2ML IJ SOLN: 4.0000 mg | Freq: Four times a day (QID) | INTRAMUSCULAR | Status: DC | PRN

## 2019-07-30 MED ORDER — INSULIN ASPART 100 UNIT/ML ~~LOC~~ SOLN
0.0000 [IU] | SUBCUTANEOUS | Status: DC
  Administered 2019-07-31 (×3): 7 [IU] via SUBCUTANEOUS
  Administered 2019-07-31: 4 [IU] via SUBCUTANEOUS
  Administered 2019-07-31: 7 [IU] via SUBCUTANEOUS
  Administered 2019-07-31: 4 [IU] via SUBCUTANEOUS
  Administered 2019-08-01 (×3): 3 [IU] via SUBCUTANEOUS
  Administered 2019-08-01 – 2019-08-02 (×2): 4 [IU] via SUBCUTANEOUS
  Administered 2019-08-02 (×2): 3 [IU] via SUBCUTANEOUS
  Filled 2019-07-30 (×13): qty 1

## 2019-07-30 MED ORDER — SODIUM CHLORIDE 0.9 % IV SOLN
200.0000 mg | Freq: Once | INTRAVENOUS | Status: DC
  Filled 2019-07-30: qty 40

## 2019-07-30 MED ORDER — ATORVASTATIN CALCIUM 80 MG PO TABS
80.0000 mg | ORAL_TABLET | Freq: Every evening | ORAL | Status: DC
  Administered 2019-07-31 – 2019-08-10 (×11): 80 mg via ORAL
  Filled 2019-07-30: qty 1
  Filled 2019-07-30 (×2): qty 4
  Filled 2019-07-30: qty 1
  Filled 2019-07-30 (×2): qty 4
  Filled 2019-07-30: qty 1
  Filled 2019-07-30 (×7): qty 4
  Filled 2019-07-30: qty 1

## 2019-07-30 MED ORDER — FUROSEMIDE 10 MG/ML IJ SOLN: 60.0000 mg | Freq: Once | INTRAMUSCULAR | Status: DC

## 2019-07-30 MED ORDER — SODIUM CHLORIDE 0.9 % IV SOLN
100.0000 mg | Freq: Every day | INTRAVENOUS | Status: DC
  Filled 2019-07-30: qty 20

## 2019-07-30 MED ORDER — LEVETIRACETAM 500 MG PO TABS
1000.0000 mg | ORAL_TABLET | Freq: Every day | ORAL | Status: DC
  Administered 2019-07-31 – 2019-08-11 (×12): 1000 mg via ORAL
  Filled 2019-07-30 (×13): qty 2

## 2019-07-30 MED ORDER — ACETAMINOPHEN 325 MG PO TABS
650.0000 mg | ORAL_TABLET | Freq: Four times a day (QID) | ORAL | Status: DC | PRN
  Administered 2019-08-01 (×2): 650 mg via ORAL
  Filled 2019-07-30 (×2): qty 2

## 2019-07-30 MED ORDER — IPRATROPIUM-ALBUTEROL 0.5-2.5 (3) MG/3ML IN SOLN
3.0000 mL | Freq: Once | RESPIRATORY_TRACT | Status: AC
  Administered 2019-07-30: 3 mL via RESPIRATORY_TRACT
  Filled 2019-07-30: qty 3

## 2019-07-30 MED ORDER — GUAIFENESIN ER 600 MG PO TB12
600.0000 mg | ORAL_TABLET | Freq: Two times a day (BID) | ORAL | Status: DC
  Administered 2019-07-31 – 2019-08-11 (×22): 600 mg via ORAL
  Filled 2019-07-30 (×25): qty 1

## 2019-07-30 MED ORDER — CALCIUM ACETATE (PHOS BINDER) 667 MG PO CAPS
2001.0000 mg | ORAL_CAPSULE | Freq: Three times a day (TID) | ORAL | Status: DC
  Administered 2019-07-31 – 2019-08-11 (×26): 2001 mg via ORAL
  Filled 2019-07-30 (×29): qty 3

## 2019-07-30 MED ORDER — GUAIFENESIN-DM 100-10 MG/5ML PO SYRP
10.0000 mL | ORAL_SOLUTION | ORAL | Status: DC | PRN
  Administered 2019-07-31: 5 mL via ORAL
  Administered 2019-08-01 – 2019-08-09 (×7): 10 mL via ORAL
  Filled 2019-07-30 (×8): qty 10

## 2019-07-30 MED ORDER — DEXTROSE 50 % IV SOLN
0.5000 | Freq: Once | INTRAVENOUS | Status: AC
  Administered 2019-07-30: 25 mL via INTRAVENOUS
  Filled 2019-07-30: qty 50

## 2019-07-30 MED ORDER — GABAPENTIN 100 MG PO CAPS
200.0000 mg | ORAL_CAPSULE | Freq: Three times a day (TID) | ORAL | Status: DC
  Administered 2019-07-31 – 2019-08-02 (×6): 200 mg via ORAL
  Filled 2019-07-30 (×10): qty 2

## 2019-07-30 MED ORDER — HEPARIN SODIUM (PORCINE) 1000 UNIT/ML DIALYSIS
1000.0000 [IU] | INTRAMUSCULAR | Status: DC | PRN
  Filled 2019-07-30: qty 1

## 2019-07-30 MED ORDER — VITAMIN D 25 MCG (1000 UNIT) PO TABS
1000.0000 [IU] | ORAL_TABLET | Freq: Every day | ORAL | Status: DC
  Administered 2019-07-31 – 2019-08-11 (×12): 1000 [IU] via ORAL
  Filled 2019-07-30 (×13): qty 1

## 2019-07-30 MED ORDER — METOPROLOL SUCCINATE ER 100 MG PO TB24
100.0000 mg | ORAL_TABLET | Freq: Every day | ORAL | Status: DC
  Administered 2019-07-31 – 2019-08-02 (×3): 100 mg via ORAL
  Filled 2019-07-30 (×2): qty 1
  Filled 2019-07-30: qty 4
  Filled 2019-07-30: qty 2

## 2019-07-30 MED ORDER — ZINC SULFATE 220 (50 ZN) MG PO CAPS
220.0000 mg | ORAL_CAPSULE | Freq: Every day | ORAL | Status: DC
  Administered 2019-07-31 – 2019-08-11 (×12): 220 mg via ORAL
  Filled 2019-07-30 (×12): qty 1

## 2019-07-30 MED ORDER — TRAZODONE HCL 50 MG PO TABS
25.0000 mg | ORAL_TABLET | Freq: Every evening | ORAL | Status: DC | PRN
  Administered 2019-08-04 – 2019-08-06 (×3): 25 mg via ORAL
  Filled 2019-07-30: qty 0.5
  Filled 2019-07-30 (×3): qty 1

## 2019-07-30 MED ORDER — SODIUM CHLORIDE 0.9 % IV SOLN
500.0000 mg | INTRAVENOUS | Status: DC
  Administered 2019-07-31 – 2019-08-04 (×5): 500 mg via INTRAVENOUS
  Filled 2019-07-30 (×6): qty 500

## 2019-07-30 MED ORDER — LIDOCAINE-PRILOCAINE 2.5-2.5 % EX CREA
1.0000 "application " | TOPICAL_CREAM | CUTANEOUS | Status: DC | PRN
  Filled 2019-07-30: qty 5

## 2019-07-30 MED ORDER — ASPIRIN 81 MG PO TBEC
81.0000 mg | DELAYED_RELEASE_TABLET | Freq: Every day | ORAL | Status: DC
  Administered 2019-07-31: 81 mg via ORAL
  Filled 2019-07-30: qty 1

## 2019-07-30 MED ORDER — ASCORBIC ACID 500 MG PO TABS
500.0000 mg | ORAL_TABLET | Freq: Every day | ORAL | Status: DC
  Administered 2019-07-31 – 2019-08-11 (×12): 500 mg via ORAL
  Filled 2019-07-30 (×12): qty 1

## 2019-07-30 MED ORDER — INSULIN ASPART 100 UNIT/ML IV SOLN
5.0000 [IU] | Freq: Once | INTRAVENOUS | Status: AC
  Administered 2019-07-30: 5 [IU] via INTRAVENOUS
  Filled 2019-07-30: qty 0.05

## 2019-07-30 MED ORDER — HYDROCOD POLST-CPM POLST ER 10-8 MG/5ML PO SUER: 5.0000 mL | Freq: Two times a day (BID) | ORAL | Status: DC | PRN

## 2019-07-30 MED ORDER — SODIUM CHLORIDE 0.9 % IV SOLN: 200.0000 mg | Freq: Once | INTRAVENOUS | Status: DC

## 2019-07-30 MED ORDER — ONDANSETRON HCL 4 MG PO TABS
4.0000 mg | ORAL_TABLET | Freq: Four times a day (QID) | ORAL | Status: DC | PRN
  Filled 2019-07-30: qty 1

## 2019-07-30 MED ORDER — LEVETIRACETAM 250 MG PO TABS
250.0000 mg | ORAL_TABLET | ORAL | Status: DC
  Filled 2019-07-30: qty 1

## 2019-07-30 MED ORDER — SODIUM CHLORIDE 0.9 % IV SOLN
2.0000 g | INTRAVENOUS | Status: DC
  Administered 2019-07-31 – 2019-08-04 (×5): 2 g via INTRAVENOUS
  Filled 2019-07-30 (×4): qty 20
  Filled 2019-07-30: qty 2
  Filled 2019-07-30 (×2): qty 20

## 2019-07-30 MED ORDER — PANTOPRAZOLE SODIUM 40 MG PO TBEC
40.0000 mg | DELAYED_RELEASE_TABLET | Freq: Every day | ORAL | Status: DC
  Administered 2019-07-31 – 2019-08-01 (×2): 40 mg via ORAL
  Filled 2019-07-30 (×3): qty 1

## 2019-07-30 MED ORDER — CALCIUM GLUCONATE 10 % IV SOLN
1.0000 g | Freq: Once | INTRAVENOUS | Status: AC
  Administered 2019-07-30: 1 g via INTRAVENOUS
  Filled 2019-07-30: qty 10

## 2019-07-30 MED ORDER — AMLODIPINE BESYLATE 5 MG PO TABS
5.0000 mg | ORAL_TABLET | Freq: Two times a day (BID) | ORAL | Status: DC
  Administered 2019-07-31 – 2019-08-02 (×6): 5 mg via ORAL
  Filled 2019-07-30 (×7): qty 1

## 2019-07-30 NOTE — ED Notes (Signed)
Dialysis RN at bedside to perform hemodialysis.

## 2019-07-30 NOTE — Progress Notes (Signed)
Remdesivir - Pharmacy Brief Note   O:  ALT: 16  CXR:  SpO2: % on    A/P:  Remdesivir 200 mg IVPB once followed by 100 mg IVPB daily x 4 days.   Sotirios Navarro D 07/30/2019 9:56 PM

## 2019-07-30 NOTE — H&P (Addendum)
[name] [facility] [place] NAME: Cynthia Dean    MR#:  [id number]  DATE OF BIRTH:  [birth year]  DATE OF ADMISSION:  07/30/2019  PRIMARY CARE PHYSICIAN: Lowella Bandy, MD   REQUESTING/REFERRING PHYSICIAN: Derrell Lolling, MD  CHIEF COMPLAINT:   Chief Complaint  Patient presents with  . Weakness    HISTORY OF PRESENT ILLNESS:  Cynthia Dean  is a 69 y.o. African-American female with a known history of COPD, coronary artery disease, type 2 diabetes mellitus and end-stage renal disease on hemodialysis on Monday, Wednesday and Friday who apparently missed dialysis on Monday, and presents to the emergency room with acute onset of generalized weakness and fatigue and low pulse oximetry in the 70s on room air by EMS.  She admits to dyspnea and cough occasionally productive of yellowish sputum over the last 5 to 7 days.  She denied any fever or chills.  No nausea vomiting or diarrhea.  No loss of taste or smell.  She required several personnel to assist her for transfer.  Per the patient's daughter over the phone she was unsure if she was dialyzed on Monday.  The patient now confirms that she missed hemodialysis on Monday.  According to her, the patient has been feeling unwell for a while.  She has a neighbor who tested positive for COVID-19 and the patient's roommate is frequently been eating that neighbor.  Upon presentation to the emergency room, blood pressure was 165/68 with a pulse currently of 89% on room air and that was up to 97% on 2 L of O2 by nasal cannula.  Labs revealed significant hypokalemia of 7.3 and a blood glucose of 223, BUN of 84 and creatinine 16.5 with anion gap of 19 and CO2 of 18.  BNP was 451 and troponin I 22.  LDH was 259 and ferritin 1360.  Procalcitonin was 1.03.  Her COVID-19 antigen test came back positive.  Portable chest x-ray revealed patchy opacities bilaterally that may reflect edema or atypical pneumonia.  EKG showed normal sinus rhythm with  a rate of 73 with minimal ST segment depression laterally.  The patient was given an amp of calcium gluconate as well as D50/insulin, an ampule sodium bicarbonate, 8 mg of IV Decadron as well as Lokelma and 60 mg of IV Lasix.  Dr. Holley Raring was notified about the patient and is planning hemodialysis soon.  She will be admitted to a stepdown unit for further evaluation and management. PAST MEDICAL HISTORY:   Past Medical History:  Diagnosis Date  . COPD (chronic obstructive pulmonary disease) ([place])   . Coronary artery disease   . Diabetes mellitus without complication ([place])   . ESRD on dialysis ([place])   . Heart murmur   . Hyperlipidemia   . Hypertension   . Stroke [facility])     PAST SURGICAL HISTORY:   Past Surgical History:  Procedure Laterality Date  . A/V FISTULAGRAM Left 12/20/2016   Procedure: A/V Fistulagram;  Surgeon: Katha Cabal, MD;  Location: [place] [facility];  Service: Cardiovascular;  Laterality: Left;  . A/V SHUNT INTERVENTION N/A 12/20/2016   Procedure: A/V Shunt Intervention;  Surgeon: Katha Cabal, MD;  Location: [place] CV LAB;  Service: Cardiovascular;  Laterality: N/A;  . A/V SHUNTOGRAM Left 09/11/2017   Procedure: A/V SHUNTOGRAM;  Surgeon: Algernon Huxley, MD;  Location: [place] [facility] LAB;  Service: Cardiovascular;  Laterality: Left;  . BREAST BIOPSY Bilateral 07/19/00   neg  . COLONOSCOPY  WITH PROPOFOL N/A 02/16/2019   Procedure: COLONOSCOPY WITH PROPOFOL;  Surgeon: Toledo, Benay Pike, MD;  Location: [facility] ENDOSCOPY;  Service: Gastroenterology;  Laterality: N/A;  . ESOPHAGOGASTRODUODENOSCOPY (EGD) WITH PROPOFOL N/A 02/16/2019   Procedure: ESOPHAGOGASTRODUODENOSCOPY (EGD) WITH PROPOFOL;  Surgeon: Toledo, Benay Pike, MD;  Location: [facility] ENDOSCOPY;  Service: Gastroenterology;  Laterality: N/A;  . LOOP RECORDER INSERTION N/A 11/16/2017   Procedure: LOOP RECORDER INSERTION;  Surgeon: Deboraha Sprang, MD;  Location: [place] CV LAB;  Service: Cardiovascular;   Laterality: N/A;  . PERIPHERAL VASCULAR CATHETERIZATION Left 02/02/2015   Procedure: A/V Shuntogram/Fistulagram;  Surgeon: Algernon Huxley, MD;  Location: [place] CV LAB;  Service: Cardiovascular;  Laterality: Left;  . PERIPHERAL VASCULAR CATHETERIZATION Left 02/02/2015   Procedure: A/V Shunt Intervention;  Surgeon: Algernon Huxley, MD;  Location: [place] CV LAB;  Service: Cardiovascular;  Laterality: Left;  . PERIPHERAL VASCULAR CATHETERIZATION Left 03/09/2015   Procedure: A/V Shuntogram/Fistulagram;  Surgeon: Algernon Huxley, MD;  Location: [place] [facility] LAB;  Service: Cardiovascular;  Laterality: Left;  . PERIPHERAL VASCULAR CATHETERIZATION N/A 03/09/2015   Procedure: A/V Shunt Intervention;  Surgeon: Algernon Huxley, MD;  Location: [place] [facility] LAB;  Service: Cardiovascular;  Laterality: N/A;  . PERIPHERAL VASCULAR CATHETERIZATION Left 04/17/2015   Procedure: Upper Extremity Angiography;  Surgeon: Algernon Huxley, MD;  Location: [place] [facility] LAB;  Service: Cardiovascular;  Laterality: Left;  . PERIPHERAL VASCULAR CATHETERIZATION  04/17/2015   Procedure: Upper Extremity Intervention;  Surgeon: Algernon Huxley, MD;  Location: [place] [facility] LAB;  Service: Cardiovascular;;  . PERIPHERAL VASCULAR CATHETERIZATION N/A 08/10/2015   Procedure: A/V Shuntogram/Fistulagram;  Surgeon: Algernon Huxley, MD;  Location: [place] [facility] LAB;  Service: Cardiovascular;  Laterality: N/A;  . PERIPHERAL VASCULAR CATHETERIZATION N/A 08/10/2015   Procedure: A/V Shunt Intervention;  Surgeon: Algernon Huxley, MD;  Location: [place] [facility] LAB;  Service: Cardiovascular;  Laterality: N/A;  . PERIPHERAL VASCULAR CATHETERIZATION Left 04/11/2016   Procedure: A/V Shuntogram/Fistulagram;  Surgeon: Algernon Huxley, MD;  Location: [place] CV LAB;  Service: Cardiovascular;  Laterality: Left;  . TEE WITHOUT CARDIOVERSION N/A 11/16/2017   Procedure: TRANSESOPHAGEAL ECHOCARDIOGRAM (TEE);  Surgeon: Minna Merritts, MD;  Location: [facility] OR[facility];   Service: Cardiovascular;  Laterality: N/A;    SOCIAL HISTORY:   Social History   Tobacco Use  . Smoking status: Former Smoker    Packs/day: 0.00    Types: Cigarettes  . Smokeless tobacco: Never Used  Substance Use Topics  . Alcohol use: No    FAMILY HISTORY:   Family History  Problem Relation Age of Onset  . Breast cancer Father 22  . Stroke Mother   . Heart attack Mother   . Heart Problems Sister   . Breast cancer Cousin 37       1 st cousin. Maternal     DRUG ALLERGIES:   Allergies  Allergen Reactions  . Hydrocodone     Intolerant more than allergic  . Tape Itching    Skin Dermatitis/itching (tape adhesive) Skin Dermatitis/itching (tape adhesive)  . Tapentadol Itching    Skin Dermatitis/itching (tape adhesive) Skin Dermatitis/itching (tape adhesive)     REVIEW OF SYSTEMS:   ROS As per history of present illness. All pertinent systems were reviewed above. Constitutional,  HEENT, cardiovascular, respiratory, GI, GU, musculoskeletal, neuro, psychiatric, endocrine,  integumentary and hematologic systems were reviewed and are otherwise  negative/unremarkable except for positive findings mentioned above in the HPI.   MEDICATIONS AT  HOME:   Prior to Admission medications   Medication Sig Start Date End Date Taking? Authorizing Provider  acetaminophen (TYLENOL) 325 MG tablet Take 2 tablets (650 mg total) by mouth every 4 (four) hours as needed for mild pain (or temp > 37.5 C (99.5 F)). 11/17/17   Gouru, Illene Silver, MD  amLODipine (NORVASC) 5 MG tablet Take 5 mg by mouth 2 (two) times daily. 08/07/18   [provider]  aspirin EC 325 MG tablet Take 325 mg by mouth daily. 05/28/19   [provider]  aspirin EC 81 MG tablet Take 1 tablet (81 mg total) by mouth daily. 02/16/19   Gladstone Lighter, MD  atorvastatin (LIPITOR) 80 MG tablet Take 80 mg by mouth every evening.     [provider]  calcium acetate (PHOSLO) 667 MG capsule Take 2,001 mg by  mouth 3 (three) times daily. 01/21/19   [provider]  clopidogrel (PLAVIX) 75 MG tablet TAKE ONE TABLET BY MOUTH ONCE DAILY Patient taking differently: Take 75 mg by mouth daily.  06/14/16   Stegmayer, Joelene Millin A, PA-C  diclofenac sodium (VOLTAREN) 1 % GEL Apply 4 g topically 4 (four) times daily. 03/23/19   Gertie Baron, NP  gabapentin (NEURONTIN) 100 MG capsule Take 2 capsules (200 mg total) by mouth 3 (three) times daily. 03/28/19 03/27/20  Rudene Re, MD  insulin NPH-regular Human (NOVOLIN 70/30) (70-30) 100 UNIT/ML injection Inject 25 Units into the skin 2 (two) times daily with a meal. Except dialysis days. Do not take on Tuesdays, Thursdays, and Saturdays. 11/17/17   Nicholes Mango, MD  levETIRAcetam (KEPPRA) 1000 MG tablet Take 1 tablet (1,000 mg total) by mouth daily. 03/15/18   Vaughan Basta, MD  levETIRAcetam (KEPPRA) 250 MG tablet Take 1 tablet (250 mg total) by mouth 3 (three) times a week. Take after dialysis on Monday, wed and Friday in addition to regular dose. 03/14/18   Vaughan Basta, MD  lidocaine-prilocaine (EMLA) cream Apply 1 application topically as needed (for port access).     [provider]  losartan (COZAAR) 100 MG tablet Take 100 mg by mouth at bedtime. Takes in the morning 09/12/17   [provider]  metoprolol succinate (TOPROL-XL) 100 MG 24 hr tablet Take 100 mg by mouth daily. Take with or immediately following a meal.     [provider]  omeprazole (PRILOSEC) 20 MG capsule Take 20 mg by mouth 2 (two) times daily.     [provider]  torsemide (DEMADEX) 100 MG tablet Take 100 mg by mouth daily. 10/10/17   [provider]      VITAL SIGNS:  Blood pressure (!) 178/73, pulse 71, temperature 99.8 F (37.7 C), temperature source Oral, resp. rate 20, weight 108.9 kg, SpO2 100 %.  PHYSICAL EXAMINATION:  Physical Exam  GENERAL:  69 y.o.-year-old African-American female patient lying in the bed  with mild respiratory distress with conversational dyspnea EYES: Pupils equal, round, reactive to light and accommodation. No scleral icterus. Extraocular muscles intact.  HEENT: Head atraumatic, normocephalic. Oropharynx and nasopharynx clear.  NECK:  Supple, no jugular venous distention. No thyroid enlargement, no tenderness.  LUNGS: Diminished bibasal breath sounds with bibasal and midlung zone crackles. CARDIOVASCULAR: Regular rate and rhythm, S1, S2 normal. No murmurs, rubs, or gallops.  ABDOMEN: Soft, nondistended, nontender. Bowel sounds present. No organomegaly or mass.  EXTREMITIES: No pedal edema, cyanosis, or clubbing.  NEUROLOGIC: Cranial nerves II through XII are intact. Muscle strength 5/5 in all extremities. Sensation intact. Gait  not checked.  PSYCHIATRIC: The patient is alert and oriented x 3.  Normal affect and good eye contact. SKIN: No obvious rash, lesion, or ulcer.   LABORATORY PANEL:   CBC Recent Labs  Lab 07/30/19 1957  WBC 7.9  HGB 10.8*  HCT 34.3*  PLT 160   ------------------------------------------------------------------------------------------------------------------  Chemistries  Recent Labs  Lab 07/30/19 1957  NA 134*  K 7.3*  CL 97*  CO2 18*  GLUCOSE 223*  BUN 84*  CREATININE 16.50*  CALCIUM 8.2*  AST 17  ALT 16  ALKPHOS 83  BILITOT 0.7   ------------------------------------------------------------------------------------------------------------------  Cardiac Enzymes No results for input(s): TROPONINI in the last 168 hours. ------------------------------------------------------------------------------------------------------------------  RADIOLOGY:  DG Chest Port 1 View  Result Date: 07/30/2019 CLINICAL DATA:  Weakness EXAM: PORTABLE CHEST 1 VIEW COMPARISON:  03/23/2019 FINDINGS: Patchy opacities superimposed on chronic interstitial prominence. No significant pleural effusion. No pneumothorax. Cardiomediastinal contours are within  normal limits. A loop recorder is again noted. IMPRESSION: Patchy opacities bilateral, which may reflect edema or atypical pneumonia. Electronically Signed   By: Macy Mis M.D.   On: 07/30/2019 20:08      IMPRESSION AND PLAN:  1.  Acute hypoxemic respiratory failure secondary to COVID-19 as well as acute on chronic diastolic CHF with fluid overload associated with end-stage renal disease on hemodialysis. -The patient will be admitted to a stepdown isolation bed. -O2 protocol will be followed to keep O2 saturation above 93. -The patient's most recent 2D echo on 01/01/2019 revealed an EF of 55 to 41% with diastolic dysfunction, moderately dilated left atrium and mild dilated right atrium. -A nephrology consultation will be obtained by Dr. Holley Raring who is aware about the patient for urgent hemodialysis. -We will repeat her BMP till then to assess further need for more aggressive management of her hyperkalemia. -We will follow troponin I and diurese the patient with IV Lasix. -Cardiology consultation will be obtained by [facility].  I notified Dr. Aundra Dubin about the patient.   2.  Multifocal pneumonia secondary to COVID-19. -The patient will be admitted to a stepdown bed with droplet and contact precautions. -Given multifocal pneumonia we will empirically place the patient on IV Rocephin and Zithromax for possible bacterial superinfection only with elevated Procalcitonin. -The patient will be placed on scheduled Mucinex and as needed Tussionex. -We will avoid nebulization as much as we can, give bronchodilator MDI if needed, and with deterioration of oxygenation try to avoid BiPAP/CPAP if possible.    -Will obtain sputum Gram stain culture and sensitivity and follow blood cultures. -O2 protocol will be followed. -We will follow CRP, ferritin, LDH and D-dimer. -Will follow manual differential for ANC/ALC ratio as well as follow troponin I and daily CBC with manual differential and CMP. - Will place the  patient on IV Remdisivir and IV steroid therapy with Decadron with elevated inflammatory markers. - I discussed convalescent plasma benefits and risks with the patient who was agreeable to proceed with it. -The patient will be placed on vitamin D3, vitamin C, zinc sulfate, p.o. Pepcid and aspirin. -Will await CRP results to consider Actemra.   3.  Mild early DKA with uncontrolled type 2 diabetes mellitus.  The patient will be placed on aggressive management with resistant NovoLog with frequent fingerstick blood glucose measures.  Given fluid overload we could not give fluid boluses.  Part of the patient's acidosis is likely secondary to her uremia.  4.  Essential hypertension.  The patient will be placed on as needed IV labetalol.[name]  We will continue Toprol-XL and Norvasc and hold off Cozaar given hyperkalemia.  5.  Dyslipidemia.  Continue statin therapy.  6.  Seizure disorder.  We will continue her Keppra.  7.  Coronary artery disease.  Continue aspirin Plavix and beta-blocker therapy as well as statin therapy.  All the records are reviewed and case discussed with ED provider. The plan of care was discussed in details with the patient (and family). I answered all questions. The patient agreed to proceed with the above mentioned plan. Further management will depend upon hospital course.   CODE STATUS: Full code  TOTAL TIME TAKING CARE OF THIS PATIENT: 55 minutes.    Christel Mormon M.D on 07/30/2019 at 10:25 PM  [facility]   From 7 PM-7 AM, contact night-coverage [email or web address]  CC: Primary care physician; Lowella Bandy, MD   Note: This dictation was prepared with Dragon dictation along with smaller phrase technology. Any transcriptional errors that result from this process are unintentional.

## 2019-07-30 NOTE — ED Triage Notes (Signed)
Pt arrived per EMS with weakness, per EMS sats at home were 70's at home. Pt needed 3 people to lift her onto wheelchair. Pt not answering questions in triage. Seems lethargic, and unable to assess pain.

## 2019-07-30 NOTE — ED Notes (Signed)
Pt oxygen increased from 2 L to 4 L d/t oxygen sats at 87%.

## 2019-07-30 NOTE — ED Provider Notes (Signed)
[facility] Emergency Department Provider Note  ____________________________________________   First MD Initiated Contact with Patient 07/30/19 1922     (approximate)  I have reviewed the triage vital signs and the nursing notes.  History  Chief Complaint Weakness    HPI Cynthia Dean is a 69 y.o. female with history of ESRD on HD (MWF), COPD not on supplemental oxygen, DM who presents via EMS for weakness. Per EMS patient's oxygen was in the 70s on RA and needed multi-person assist to transfer her.   She appears weak and lethargic, is oriented x 3 but unable to provide significant history. Does report hx of COPD and complains of SOB.   History significantly limited by patient presentation.   Per daughter via phone, unsure if she dialyzed yesterday. Mom has been feeling "unwell" for some time. The neighbor is COVID positive and her roommate has frequently been visiting the neighbor.    Past Medical Hx Past Medical History:  Diagnosis Date  . COPD (chronic obstructive pulmonary disease) ([place])   . Coronary artery disease   . Diabetes mellitus without complication ([place])   . ESRD on dialysis ([place])   . Heart murmur   . Hyperlipidemia   . Hypertension   . Stroke [facility])     Problem List Patient Active Problem List   Diagnosis Date Noted  . Rectal bleed 02/13/2019  . Acute respiratory failure with hypoxia ([place]) 12/31/2018  . Nausea & vomiting 05/30/2018  . Nausea and vomiting 05/29/2018  . Partial seizure ([place]) 03/14/2018  . Stroke ([place]) 03/07/2018  . Slurred speech 11/14/2017  . Diabetes ([place]) 11/12/2017  . CAD (coronary artery disease) 11/12/2017  . COPD (chronic obstructive pulmonary disease) ([place]) 11/12/2017  . ESRD on dialysis ([place]) 11/12/2017  . Hematemesis 06/27/2017  . CVA (cerebral vascular accident) ([place]) 04/14/2016  . Essential hypertension, benign 11/27/2015  . Hyperlipidemia 11/27/2015  . Obesity 11/27/2015  . Prolonged  Q-T interval on ECG 11/26/2015  . Intractable nausea and vomiting 08/08/2015    Past Surgical Hx Past Surgical History:  Procedure Laterality Date  . A/V FISTULAGRAM Left 12/20/2016   Procedure: A/V Fistulagram;  Surgeon: Katha Cabal, MD;  Location: [place] [facility];  Service: Cardiovascular;  Laterality: Left;  . A/V SHUNT INTERVENTION N/A 12/20/2016   Procedure: A/V Shunt Intervention;  Surgeon: Katha Cabal, MD;  Location: [place] [facility];  Service: Cardiovascular;  Laterality: N/A;  . A/V SHUNTOGRAM Left 09/11/2017   Procedure: A/V SHUNTOGRAM;  Surgeon: Algernon Huxley, MD;  Location: [place] [facility];  Service: Cardiovascular;  Laterality: Left;  . BREAST BIOPSY Bilateral 07/19/00   neg  . COLONOSCOPY WITH PROPOFOL N/A 02/16/2019   Procedure: COLONOSCOPY WITH PROPOFOL;  Surgeon: Toledo, Benay Pike, MD;  Location: [facility] ENDOSCOPY;  Service: Gastroenterology;  Laterality: N/A;  . ESOPHAGOGASTRODUODENOSCOPY (EGD) WITH PROPOFOL N/A 02/16/2019   Procedure: ESOPHAGOGASTRODUODENOSCOPY (EGD) WITH PROPOFOL;  Surgeon: Toledo, Benay Pike, MD;  Location: [facility] ENDOSCOPY;  Service: Gastroenterology;  Laterality: N/A;  . LOOP RECORDER INSERTION N/A 11/16/2017   Procedure: LOOP RECORDER INSERTION;  Surgeon: Deboraha Sprang, MD;  Location: [place] [facility];  Service: Cardiovascular;  Laterality: N/A;  . PERIPHERAL VASCULAR CATHETERIZATION Left 02/02/2015   Procedure: A/V Shuntogram/Fistulagram;  Surgeon: Algernon Huxley, MD;  Location: [place] [facility];  Service: Cardiovascular;  Laterality: Left;  . PERIPHERAL VASCULAR CATHETERIZATION Left 02/02/2015   Procedure: A/V Shunt Intervention;  Surgeon: Algernon Huxley, MD;  Location: [place]  [facility];  Service: Cardiovascular;  Laterality: Left;  . PERIPHERAL VASCULAR CATHETERIZATION Left 03/09/2015   Procedure: A/V Shuntogram/Fistulagram;  Surgeon: Algernon Huxley, MD;  Location: [place] [facility];  Service: Cardiovascular;  Laterality: Left;   . PERIPHERAL VASCULAR CATHETERIZATION N/A 03/09/2015   Procedure: A/V Shunt Intervention;  Surgeon: Algernon Huxley, MD;  Location: [place] [facility];  Service: Cardiovascular;  Laterality: N/A;  . PERIPHERAL VASCULAR CATHETERIZATION Left 04/17/2015   Procedure: Upper Extremity Angiography;  Surgeon: Algernon Huxley, MD;  Location: [place] [facility];  Service: Cardiovascular;  Laterality: Left;  . PERIPHERAL VASCULAR CATHETERIZATION  04/17/2015   Procedure: Upper Extremity Intervention;  Surgeon: Algernon Huxley, MD;  Location: [place] [facility];  Service: Cardiovascular;;  . PERIPHERAL VASCULAR CATHETERIZATION N/A 08/10/2015   Procedure: A/V Shuntogram/Fistulagram;  Surgeon: Algernon Huxley, MD;  Location: [place] [facility];  Service: Cardiovascular;  Laterality: N/A;  . PERIPHERAL VASCULAR CATHETERIZATION N/A 08/10/2015   Procedure: A/V Shunt Intervention;  Surgeon: Algernon Huxley, MD;  Location: [place] [facility];  Service: Cardiovascular;  Laterality: N/A;  . PERIPHERAL VASCULAR CATHETERIZATION Left 04/11/2016   Procedure: A/V Shuntogram/Fistulagram;  Surgeon: Algernon Huxley, MD;  Location: [place] [facility];  Service: Cardiovascular;  Laterality: Left;  . TEE WITHOUT CARDIOVERSION N/A 11/16/2017   Procedure: TRANSESOPHAGEAL ECHOCARDIOGRAM (TEE);  Surgeon: Minna Merritts, MD;  Location: [facility];  Service: Cardiovascular;  Laterality: N/A;    Medications Prior to Admission medications   Medication Sig Start Date End Date Taking? Authorizing Provider  acetaminophen (TYLENOL) 325 MG tablet Take 2 tablets (650 mg total) by mouth every 4 (four) hours as needed for mild pain (or temp > 37.5 C (99.5 F)). 11/17/17   Gouru, Illene Silver, MD  amLODipine (NORVASC) 5 MG tablet Take 5 mg by mouth 2 (two) times daily. 08/07/18   [provider]  aspirin EC 81 MG tablet Take 1 tablet (81 mg total) by mouth daily. 02/16/19   Gladstone Lighter, MD  atorvastatin (LIPITOR) 80 MG tablet Take 80 mg by mouth every  evening.     [provider]  calcium acetate (PHOSLO) 667 MG capsule Take 2,001 mg by mouth 3 (three) times daily. 01/21/19   [provider]  clopidogrel (PLAVIX) 75 MG tablet TAKE ONE TABLET BY MOUTH ONCE DAILY Patient taking differently: Take 75 mg by mouth daily.  06/14/16   Stegmayer, Joelene Millin A, PA-C  diclofenac sodium (VOLTAREN) 1 % GEL Apply 4 g topically 4 (four) times daily. 03/23/19   Gertie Baron, NP  gabapentin (NEURONTIN) 100 MG capsule Take 2 capsules (200 mg total) by mouth 3 (three) times daily. 03/28/19 03/27/20  Rudene Re, MD  insulin NPH-regular Human (NOVOLIN 70/30) (70-30) 100 UNIT/ML injection Inject 25 Units into the skin 2 (two) times daily with a meal. Except dialysis days. Do not take on Tuesdays, Thursdays, and Saturdays. 11/17/17   Nicholes Mango, MD  levETIRAcetam (KEPPRA) 1000 MG tablet Take 1 tablet (1,000 mg total) by mouth daily. 03/15/18   Vaughan Basta, MD  levETIRAcetam (KEPPRA) 250 MG tablet Take 1 tablet (250 mg total) by mouth 3 (three) times a week. Take after dialysis on Monday, wed and Friday in addition to regular dose. 03/14/18   Vaughan Basta, MD  lidocaine-prilocaine (EMLA) cream Apply 1 application topically as needed (for port access).     [provider]  losartan (COZAAR) 100 MG tablet Take 100 mg by mouth at bedtime. Takes in the morning  09/12/17   [provider]  metoprolol succinate (TOPROL-XL) 100 MG 24 hr tablet Take 100 mg by mouth daily. Take with or immediately following a meal.     [provider]  omeprazole (PRILOSEC) 20 MG capsule Take 20 mg by mouth 2 (two) times daily.     [provider]  torsemide (DEMADEX) 100 MG tablet Take 100 mg by mouth daily. 10/10/17   [provider]    Allergies Hydrocodone, Tape, and Tapentadol  Family Hx Family History  Problem Relation Age of Onset  . Breast cancer Father 10  . Stroke Mother   . Heart attack Mother     . Heart Problems Sister   . Breast cancer Cousin 74       1 st cousin. Maternal     Social Hx Social History   Tobacco Use  . Smoking status: Former Smoker    Packs/day: 0.00    Types: Cigarettes  . Smokeless tobacco: Never Used  Substance Use Topics  . Alcohol use: No  . Drug use: No     Review of Systems  Constitutional: Negative for fever, chills. Eyes: Negative for visual changes. ENT: Negative for sore throat. Cardiovascular: Negative for chest pain. Respiratory: + for shortness of breath, wheezing. Gastrointestinal: Negative for nausea, vomiting.  Genitourinary: Negative for dysuria. Musculoskeletal: Negative for leg swelling. Skin: Negative for rash. Neurological: Negative for for headaches.   Physical Exam  Vital Signs: ED Triage Vitals  Enc Vitals Group     BP 07/30/19 1912 (!) 165/68     Pulse Rate 07/30/19 1912 79     Resp 07/30/19 1912 20     Temp 07/30/19 1912 99.8 F (37.7 C)     Temp Source 07/30/19 1912 Oral     SpO2 07/30/19 1912 (!) 89 %     Weight 07/30/19 1913 240 lb (108.9 kg)     Height --      Head Circumference --      Peak Flow --      Pain Score 07/30/19 1913 0     Pain Loc --      Pain Edu? --      Excl. in [place]? --     Constitutional: Alert and oriented x 3, but appears significantly fatigued and lethargic.  Head: Normocephalic. Atraumatic. Eyes: Conjunctivae clear. Sclera anicteric. Nose: No congestion. No rhinorrhea. Mouth/Throat: Wearing mask.  Neck: No stridor.   Cardiovascular: Normal rate, regular rhythm. Extremities well perfused. Respiratory: Hypoxic on RA to mid/upper 80s, placed on 2 L Tool. Significant wheezing throughout. Gastrointestinal: Soft. Non-tender. Non-distended.  Musculoskeletal: No lower extremity edema. No deformities. Neurologic:  Normal speech and language. No gross focal neurologic deficits are appreciated.  Skin: Skin is warm, dry and intact. No rash noted. Psychiatric: Mood and affect are  appropriate for situation.  EKG  Personally reviewed.   Rate: 73 Rhythm: sinus Axis: normal Intervals: WNL Minimal, subtle ST depressions V4, V5 No STEMI    Radiology  CXR: IMPRESSION:  Patchy opacities bilateral, which may reflect edema or atypical  pneumonia.    Procedures  Procedure(s) performed (including critical care):  .Critical Care Performed by: Lilia Pro., MD Authorized by: Lilia Pro., MD   Critical care provider statement:    Critical care time (minutes):  45   Critical care was necessary to treat or prevent imminent or life-threatening deterioration of the following conditions:  Renal failure and respiratory failure   Critical care was time spent personally  by me on the following activities:  Discussions with consultants, evaluation of patient's response to treatment, examination of patient, ordering and performing treatments and interventions, ordering and review of laboratory studies, ordering and review of radiographic studies, pulse oximetry, re-evaluation of patient's condition, obtaining history from patient or surrogate and review of old charts     Initial Impression / Assessment and Plan / ED Course  69 y.o. female who presents to the ED for weakness, fatigue, SOB, as above  Ddx: COPD exacerbation, pulmonary edema/fluid overload in setting of ESRD status, pulmonary infection, COVID-19  Will obtain labs, EKG, imaging, provide supplemental oxygen  Work-up reveals significant hyperkalemia to 7.3. Given calcium, bicarb, insulin/dextrose, albuterol, [place]. CXR with infection and/or edema. Also COVID positive, inflammatory markers pending. Ordered steroids, remdesivir dosing per pharmacy. Discussed with nephrology who agrees with shifting for now, will attempt to dialyze later tonight. Will admit.   Final Clinical Impression(s) / ED Diagnosis  Final diagnoses:  SOB (shortness of breath)  Weakness  Hypoxia  Hyperkalemia       Note:   This document was prepared using Dragon voice recognition software and may include unintentional dictation errors.   Lilia Pro., MD 07/31/19 [phone]

## 2019-07-30 NOTE — Progress Notes (Signed)
Pre HD Assessment    07/30/19 2105  Neurological  Level of Consciousness Alert  Orientation Level Oriented to person;Disoriented to place;Disoriented to time  Respiratory  Respiratory Pattern Regular  Chest Assessment Chest expansion symmetrical  Bilateral Breath Sounds Diminished  Cough None  Cardiac  Pulse Regular  Heart Sounds S1, S2  ECG Monitor Yes  Cardiac Rhythm NSR  Vascular  R Radial Pulse +2  L Radial Pulse +2  Edema Generalized  Psychosocial  Psychosocial (WDL) WDL

## 2019-07-30 NOTE — ED Notes (Signed)
Pt here for weakness. Pt seems lethargic. Unable to tell this RN when the last time she was dialyzed.

## 2019-07-30 NOTE — ED Notes (Signed)
Pt resting in bed comfortably while receiving dialysis. Dialysis RN at bedside. VSS.

## 2019-07-30 NOTE — ED Notes (Signed)
Updated daughter on pt condition and plan of care.

## 2019-07-31 ENCOUNTER — Encounter: Payer: Self-pay | Admitting: Family Medicine

## 2019-07-31 DIAGNOSIS — I5033 Acute on chronic diastolic (congestive) heart failure: Secondary | ICD-10-CM

## 2019-07-31 DIAGNOSIS — E875 Hyperkalemia: Secondary | ICD-10-CM | POA: Insufficient documentation

## 2019-07-31 DIAGNOSIS — J1282 Pneumonia due to coronavirus disease 2019: Secondary | ICD-10-CM | POA: Diagnosis present

## 2019-07-31 DIAGNOSIS — I5031 Acute diastolic (congestive) heart failure: Secondary | ICD-10-CM

## 2019-07-31 LAB — C-REACTIVE PROTEIN: CRP: 4.7 mg/dL — ABNORMAL HIGH (ref <1.0)

## 2019-07-31 LAB — COMPREHENSIVE METABOLIC PANEL
ALT: 19 U/L (ref 0–44)
AST: 22 U/L (ref 15–41)
Albumin: 3.6 g/dL (ref 3.5–5.0)
Alkaline Phosphatase: 86 U/L (ref 38–126)
Anion gap: 16 — ABNORMAL HIGH (ref 5–15)
BUN: 57 mg/dL — ABNORMAL HIGH (ref 8–23)
CO2: 25 mmol/L (ref 22–32)
Calcium: 8.6 mg/dL — ABNORMAL LOW (ref 8.9–10.3)
Chloride: 97 mmol/L — ABNORMAL LOW (ref 98–111)
Creatinine, Ser: 11.95 mg/dL — ABNORMAL HIGH (ref 0.44–1.00)
GFR calc Af Amer: 3 mL/min — ABNORMAL LOW (ref >60)
GFR calc non Af Amer: 3 mL/min — ABNORMAL LOW (ref >60)
Glucose, Bld: 210 mg/dL — ABNORMAL HIGH (ref 70–99)
Potassium: 5.6 mmol/L — ABNORMAL HIGH (ref 3.5–5.1)
Sodium: 138 mmol/L (ref 135–145)
Total Bilirubin: 0.8 mg/dL (ref 0.3–1.2)
Total Protein: 7.7 g/dL (ref 6.5–8.1)

## 2019-07-31 LAB — CBC WITH DIFFERENTIAL/PLATELET
Abs Immature Granulocytes: 0.03 10*3/uL (ref 0.00–0.07)
Basophils Absolute: 0 10*3/uL (ref 0.0–0.1)
Basophils Relative: 0 %
Eosinophils Absolute: 0 10*3/uL (ref 0.0–0.5)
Eosinophils Relative: 0 %
HCT: 35.7 % — ABNORMAL LOW (ref 36.0–46.0)
Hemoglobin: 11.1 g/dL — ABNORMAL LOW (ref 12.0–15.0)
Immature Granulocytes: 0 %
Lymphocytes Relative: 11 %
Lymphs Abs: 0.7 10*3/uL (ref 0.7–4.0)
MCH: 27.3 pg (ref 26.0–34.0)
MCHC: 31.1 g/dL (ref 30.0–36.0)
MCV: 87.9 fL (ref 80.0–100.0)
Monocytes Absolute: 0.3 10*3/uL (ref 0.1–1.0)
Monocytes Relative: 4 %
Neutro Abs: 5.8 10*3/uL (ref 1.7–7.7)
Neutrophils Relative %: 85 %
Platelets: 146 10*3/uL — ABNORMAL LOW (ref 150–400)
RBC: 4.06 MIL/uL (ref 3.87–5.11)
RDW: 14.7 % (ref 11.5–15.5)
WBC: 6.9 10*3/uL (ref 4.0–10.5)
nRBC: 0 % (ref 0.0–0.2)

## 2019-07-31 LAB — GLUCOSE, CAPILLARY
Glucose-Capillary: 175 mg/dL — ABNORMAL HIGH (ref 70–99)
Glucose-Capillary: 222 mg/dL — ABNORMAL HIGH (ref 70–99)
Glucose-Capillary: 175 mg/dL — ABNORMAL HIGH (ref 70–99)
Glucose-Capillary: 212 mg/dL — ABNORMAL HIGH (ref 70–99)
Glucose-Capillary: 231 mg/dL — ABNORMAL HIGH (ref 70–99)
Glucose-Capillary: 215 mg/dL — ABNORMAL HIGH (ref 70–99)

## 2019-07-31 LAB — D-DIMER, QUANTITATIVE: D-Dimer, Quant: 1.93 ug/mL-FEU — ABNORMAL HIGH (ref 0.00–0.50)

## 2019-07-31 LAB — HIV ANTIBODY (ROUTINE TESTING W REFLEX): HIV Screen 4th Generation wRfx: NONREACTIVE

## 2019-07-31 LAB — HEMOGLOBIN A1C
Hgb A1c MFr Bld: 8.3 % — ABNORMAL HIGH (ref 4.8–5.6)
Mean Plasma Glucose: 191.51 mg/dL

## 2019-07-31 LAB — FERRITIN: Ferritin: 1577 ng/mL — ABNORMAL HIGH (ref 11–307)

## 2019-07-31 MED ORDER — LEVETIRACETAM 250 MG PO TABS
250.0000 mg | ORAL_TABLET | ORAL | Status: DC
  Administered 2019-07-31 – 2019-08-07 (×3): 250 mg via ORAL
  Filled 2019-07-31 (×5): qty 1

## 2019-07-31 MED ORDER — SODIUM CHLORIDE 0.9 % IV SOLN
200.0000 mg | Freq: Once | INTRAVENOUS | Status: AC
  Administered 2019-07-31: 17:00:00 200 mg via INTRAVENOUS
  Filled 2019-07-31: qty 200

## 2019-07-31 MED ORDER — FAMOTIDINE 20 MG PO TABS
10.0000 mg | ORAL_TABLET | Freq: Every day | ORAL | Status: DC
  Administered 2019-08-01: 10 mg via ORAL
  Filled 2019-07-31 (×2): qty 1

## 2019-07-31 MED ORDER — SODIUM CHLORIDE 0.9 % IV SOLN
100.0000 mg | Freq: Every day | INTRAVENOUS | Status: AC
  Administered 2019-08-01 – 2019-08-04 (×4): 100 mg via INTRAVENOUS
  Filled 2019-07-31: qty 100
  Filled 2019-07-31 (×2): qty 20
  Filled 2019-07-31: qty 100

## 2019-07-31 MED ORDER — SODIUM CHLORIDE 0.9 % IV SOLN: 100.0000 mg | Freq: Every day | INTRAVENOUS | Status: DC

## 2019-07-31 MED ORDER — IPRATROPIUM-ALBUTEROL 20-100 MCG/ACT IN AERS
1.0000 | INHALATION_SPRAY | Freq: Four times a day (QID) | RESPIRATORY_TRACT | Status: DC
  Administered 2019-07-31 – 2019-08-11 (×40): 1 via RESPIRATORY_TRACT
  Filled 2019-07-31 (×2): qty 4

## 2019-07-31 NOTE — Progress Notes (Signed)
Post HD Assessment    07/31/19 0038  Neurological  Level of Consciousness Alert  Orientation Level Oriented to person;Oriented to place  Respiratory  Respiratory Pattern Regular;Unlabored  Chest Assessment Chest expansion symmetrical  Bilateral Breath Sounds Diminished  Cough Congested;Non-productive  Cardiac  Pulse Regular  Heart Sounds S1, S2  ECG Monitor Yes  Cardiac Rhythm NSR  Vascular  R Radial Pulse +2  L Radial Pulse +2  Edema Generalized  Psychosocial  Psychosocial (WDL) WDL

## 2019-07-31 NOTE — Progress Notes (Signed)
Patient underwent urgent dialysis yesterday for severe hyperkalemia with potassium of 7.3.  Potassium down to 5.6 earlier this a.m.  No urgent indication for repeat dialysis.  We will plan for dialysis treatment again tomorrow.

## 2019-07-31 NOTE — ED Notes (Signed)
Pt given phone to speak with [facility] [phone]. Pt denies any needs at this time. Pt reminded not to get out of bed without staff. O2 sats are stable.

## 2019-07-31 NOTE — Progress Notes (Signed)
Post HD [facility]    07/31/19 0037  Hand-Off documentation  Report given to (Full Name) Page, RN   Report received from (Full Name) Beatris Ship, RN   Vital Signs  Temp 99.3 F (37.4 C)  Temp Source Oral  Pulse Rate 72  Pulse Rate Source Monitor  Resp 19  BP (!) 152/126  BP Location Right Arm  BP Method Automatic  Patient Position (if appropriate) Lying  Oxygen Therapy  SpO2 92 %  O2 Device Nasal Cannula  O2 Flow Rate (L/min) 4 L/min  Pulse Oximetry Type Continuous  Pain Assessment  Pain Scale 0-10  Pain Score 0  Dialysis Weight  Weight 106.3 kg  Type of Weight Post-Dialysis  Post-Hemodialysis Assessment  Rinseback Volume (mL) 250 mL  KECN 47.8 V  Dialyzer Clearance Clotted  Duration of HD Treatment -hour(s) 2.5 hour(s)  Hemodialysis Intake (mL) 500 mL  UF Total -Machine (mL) 2503 mL  Net UF (mL) 2003 mL  Tolerated HD Treatment Yes  Post-Hemodialysis Comments System clotted at very end of [place] unable to return blood   AVG/AVF Arterial Site Held (minutes) 5 minutes  AVG/AVF Venous Site Held (minutes) 5 minutes

## 2019-07-31 NOTE — ED Notes (Signed)
Pt given phone to contact family. On phone with daughter at this time

## 2019-07-31 NOTE — ED Notes (Signed)
Pt found sitting at the end of the bed. Pt is unable to bear weight. Pt placed back in bed and cleaned and linens changed. Pt given new socks. Pt shivering and pt given hot blankets. Pt pulled her oxygen off when she attempted to get out of bed and not use the call bell. Pt told not to get out bed due to oxygen being pulled off. Pt coughing up phlegm. Purewick placed on pt.

## 2019-07-31 NOTE — Progress Notes (Signed)
HD Tx completed, tolerated well.    07/31/19 0030  Vital Signs  Pulse Rate (!) 58  Pulse Rate Source Monitor  Resp 19  BP (!) 157/65  BP Location Right Arm  BP Method Automatic  Patient Position (if appropriate) Lying  Oxygen Therapy  SpO2 96 %  O2 Device Nasal Cannula  O2 Flow Rate (L/min) 4 L/min  Pulse Oximetry Type Continuous  During Hemodialysis Assessment  HD Safety Checks Performed Yes  KECN 47.8 KECN  Dialysis Fluid Bolus Normal Saline  Bolus Amount (mL) 250 mL  Intra-Hemodialysis Comments Tx completed;Tolerated well

## 2019-07-31 NOTE — ED Notes (Signed)
Pt sitting on side of stretcher, incont of soft lt brown stool; pt assisted to room commode; linen changed & pt clensed; pt has also pulled out own IV--cannula intact

## 2019-07-31 NOTE — Progress Notes (Signed)
Remdesivir - Pharmacy Brief Note  SARS Coronavirus 2 Ag: Positive 07/30/2019   O:  ALT: 19 CXR: Patchy opacities bilateral, which may reflect edema or atypical pneumonia. SpO2: 89 % on Room Air    A/P:  Remdesivir 200 mg IVPB once followed by 100 mg IVPB daily x 4 days.   Pernell Dupre, PharmD, BCPS Clinical Pharmacist 07/31/2019 1:20 PM

## 2019-07-31 NOTE — ED Notes (Signed)
Lab contactred fo rblood draw

## 2019-07-31 NOTE — ED Notes (Signed)
Attempted to obtain blood cultures. One set obtained. Second stick unsuccessful.

## 2019-07-31 NOTE — Progress Notes (Signed)
Patient ID: Cynthia Dean, female   DOB: May 17, 1951, 69 y.o.   MRN: [id number] [facility] PROGRESS NOTE  Gustavo Meditz SPQ:[id number] DOB: 04-22-1951 DOA: 07/30/2019 PCP: Lowella Bandy, MD  HPI/Subjective: Patient seen this morning.  She was feeling a little bit better than last night.  Still has some shortness of breath and some cough.  Had some diarrhea.  Objective: Vitals:   07/31/19 1545 07/31/19 1600  BP:    Pulse: 67 68  Resp: 19 20  Temp:    SpO2: 95% 97%    Filed Weights   07/30/19 1913 07/30/19 2200 07/31/19 0037  Weight: 108.9 kg 108.9 kg 106.3 kg    ROS: Review of Systems  Constitutional: Negative for chills and fever.  Eyes: Negative for blurred vision.  Respiratory: Positive for cough and shortness of breath.   Cardiovascular: Negative for chest pain.  Gastrointestinal: Positive for diarrhea. Negative for abdominal pain, constipation, nausea and vomiting.  Genitourinary: Negative for dysuria.  Musculoskeletal: Negative for joint pain.  Neurological: Negative for dizziness and headaches.   Exam: Physical Exam  Constitutional: She is oriented to person, place, and time.  HENT:  Nose: No mucosal edema.  Mouth/Throat: No oropharyngeal exudate or posterior oropharyngeal edema.  Eyes: Conjunctivae and lids are normal.  Neck: Carotid bruit is not present.  Cardiovascular: S1 normal and S2 normal. Exam reveals no gallop.  Murmur heard.  Systolic murmur is present with a grade of 2/6. Respiratory: No respiratory distress. She has decreased breath sounds in the right lower field and the left lower field. She has no wheezes. She has rhonchi in the right lower field and the left lower field. She has no rales.  GI: Soft. Bowel sounds are normal. There is no abdominal tenderness.  Musculoskeletal:     Right ankle: Swelling present.     Left ankle: Swelling present.  Lymphadenopathy:    She has no cervical adenopathy.  Neurological: She is  alert and oriented to person, place, and time. No cranial nerve deficit.  Skin: Skin is warm. No rash noted. Nails show no clubbing.  Psychiatric: She has a normal mood and affect.      Data Reviewed: Basic Metabolic Panel: Recent Labs  Lab 07/30/19 1957 07/31/19 0210  NA 134* 138  K 7.3* 5.6*  CL 97* 97*  CO2 18* 25  GLUCOSE 223* 210*  BUN 84* 57*  CREATININE 16.50* 11.95*  CALCIUM 8.2* 8.6*   Liver Function Tests: Recent Labs  Lab 07/30/19 1957 07/31/19 0210  AST 17 22  ALT 16 19  ALKPHOS 83 86  BILITOT 0.7 0.8  PROT 7.3 7.7  ALBUMIN 3.5 3.6   CBC: Recent Labs  Lab 07/30/19 1957 07/31/19 0210  WBC 7.9 6.9  NEUTROABS 6.2 5.8  HGB 10.8* 11.1*  HCT 34.3* 35.7*  MCV 87.7 87.9  PLT 160 146*   BNP (last 3 results) Recent Labs    12/31/18 0818 07/30/19 1957  BNP 528.0* 451.0*     CBG: Recent Labs  Lab 07/31/19 0128 07/31/19 0451 07/31/19 0834 07/31/19 1217  GLUCAP 175* 222* 175* 212*    Recent Results (from the past 240 hour(s))  Culture, blood (Routine X 2) w Reflex to ID Panel     Status: None (Preliminary result)   Collection Time: 07/31/19 12:27 AM   Specimen: BLOOD  Result Value Ref Range Status   Specimen Description BLOOD RIGHT WRIST  Final   Special Requests   Final    BOTTLES DRAWN  AEROBIC AND ANAEROBIC Blood Culture adequate volume   Culture   Final    NO GROWTH < 12 HOURS Performed at [facility], [address] [place], [place] [address]    Report Status PENDING  Incomplete  Culture, blood (Routine X 2) w Reflex to ID Panel     Status: None (Preliminary result)   Collection Time: 07/31/19 12:44 AM   Specimen: BLOOD  Result Value Ref Range Status   Specimen Description BLOOD BLOOD RIGHT WRIST  Final   Special Requests   Final    BOTTLES DRAWN AEROBIC AND ANAEROBIC Blood Culture adequate volume   Culture   Final    NO GROWTH < 12 HOURS Performed at [facility], [address] [place],   [address]    Report Status PENDING  Incomplete     Studies: DG Chest Port 1 View  Result Date: 07/30/2019 CLINICAL DATA:  Weakness EXAM: PORTABLE CHEST 1 VIEW COMPARISON:  03/23/2019 FINDINGS: Patchy opacities superimposed on chronic interstitial prominence. No significant pleural effusion. No pneumothorax. Cardiomediastinal contours are within normal limits. A loop recorder is again noted. IMPRESSION: Patchy opacities bilateral, which may reflect edema or atypical pneumonia. Electronically Signed   By: Macy Mis M.D.   On: 07/30/2019 20:08    Scheduled Meds: . amLODipine  5 mg Oral BID  . vitamin C  500 mg Oral Daily  . aspirin EC  325 mg Oral Daily  . atorvastatin  80 mg Oral QPM  . calcium acetate  2,001 mg Oral TID WC  . cholecalciferol  1,000 Units Oral Daily  . clopidogrel  75 mg Oral Daily  . dexamethasone (DECADRON) injection  6 mg Intravenous Q24H  . [START ON 08/01/2019] famotidine  10 mg Oral Daily  . furosemide  60 mg Intravenous Once  . gabapentin  200 mg Oral TID  . guaiFENesin  600 mg Oral BID  . heparin  5,000 Units Subcutaneous Q12H  . insulin aspart  0-20 Units Subcutaneous Q4H  . insulin detemir  0.15 Units/kg Subcutaneous BID  . levETIRAcetam  1,000 mg Oral Daily  . levETIRAcetam  250 mg Oral Once per day on Mon Wed Fri  . metoprolol succinate  100 mg Oral Daily  . pantoprazole  40 mg Oral Daily  . torsemide  100 mg Oral Daily  . zinc sulfate  220 mg Oral Daily   Continuous Infusions: . sodium chloride    . sodium chloride    . azithromycin (ZITHROMAX) 500 MG IVPB (Vial-Mate [employer]) Stopped (07/31/19 0400)  . cefTRIAXone (ROCEPHIN)  IV Stopped (07/31/19 0301)  . remdesivir 200 mg in sodium chloride 0.9% 250 mL IVPB     Followed by  . [START ON 08/01/2019] remdesivir 100 mg in NS 100 mL      Assessment/Plan:  1. Acute on chronic hypoxic respiratory failure secondary to fluid overload from missing dialysis and COVID-19 pneumonia.  Patient doing better  today.  Patient was on 6 L last night.  I dialed her down to 3 this morning.  She chronically wears 2. 2. Acute diastolic congestive heart failure and fluid overload secondary to missing dialysis session.  Patient had urgent dialysis last night to remove fluid and potassium.  Respiratory status has improved. 3. Multifocal pneumonia secondary to Covid 19.  Remdesivir started yesterday.  Today will be day 2.  Continue steroids, vitamin C and zinc.  Combivent inhaler.  Also started on Rocephin and Zithromax. 4. Hyperkalemia with a potassium of 7.5 last  evening.  Urgent dialysis done last evening for this. 5. Type 2 diabetes mellitus with end-stage renal disease.  Continue low-dose detemir.  Check hemoglobin A1c. 6. Hyperlipidemia unspecified on atorvastatin 7. Seizure history on Keppra 8. Essential hypertension on metoprolol and Norvasc  Code Status:     Code Status Orders  (From admission, onward)         Start     Ordered   07/30/19 2218  Full code  Continuous     07/30/19 2224        Code Status History    Date Active Date Inactive Code Status Order ID Comments User Context   02/13/2019 1329 02/16/2019 1849 Full Code [id number]  Lang Snow, NP ED   12/31/2018 1159 01/02/2019 1330 Full Code [id number]  Lang Snow, NP ED   05/29/2018 1541 05/30/2018 2041 Full Code [id number]  Loletha Grayer, MD ED   03/14/2018 0141 03/15/2018 0336 Full Code [id number]  Amelia Jo, MD Inpatient   03/08/2018 0005 03/09/2018 2108 Full Code [id number]  Vaughan Basta, MD Inpatient   11/15/2017 0017 11/17/2017 1809 Full Code [id number]  Lance Coon, MD ED   11/12/2017 2219 11/13/2017 1935 Full Code [id number]  Lance Coon, MD ED   04/14/2016 1330 04/14/2016 1700 Full Code [id number]  Bettey Costa, MD ED   08/08/2015 2257 08/09/2015 2054 Full Code [id number]  Lavetta Nielsen, Aaron Mose, MD ED   04/17/2015 1334 04/17/2015 1836 Full Code [id number]  Dew, Erskine Squibb, MD Inpatient   Advance Care Planning Activity      Disposition Plan: To be determined based on clinical course.  Today is day 2 of remdesivir.  Consultants:  Nephrology  Antibiotics: -Rocephin and Zithromax -Remdesivir  Time spent: 28 minutes  [place]

## 2019-07-31 NOTE — Progress Notes (Signed)
Inpatient Diabetes Program Recommendations  AACE/ADA: New Consensus Statement on Inpatient Glycemic Control   Target Ranges:  Prepandial:   less than 140 mg/dL      Peak postprandial:   less than 180 mg/dL (1-2 hours)      Critically ill patients:  140 - 180 mg/dL   Results for Cynthia Dean, Cynthia Dean (MRN [id number]) as of 07/31/2019 08:35  Ref. Range 07/31/2019 01:28 07/31/2019 04:51  Glucose-Capillary Latest Ref Range: 70 - 99 mg/dL 175 (H) 222 (H)   Review of Glycemic Control  Diabetes history: DM2 Outpatient Diabetes medications: 70/30 25 units BID (does not take on dialysis days) Current orders for Inpatient glycemic control: Levemir 16 units BID, Novolog 0-20 units Q4H; Decadron 6 mg Q24H  Inpatient Diabetes Program Recommendations:   HbgA1C: Please consider ordering an A1C to evaluate glycemic control over the past 2-3 months.  NOTE: Noted consult for Diabetes Coordinator per COVID Glycemic order set. Chart reviewed. Agree with current orders at this time.  Thanks, Barnie Alderman, RN, MSN, CDE Diabetes Coordinator Inpatient Diabetes Program [phone] (Team Pager from 8am to 5pm)

## 2019-08-01 DIAGNOSIS — E785 Hyperlipidemia, unspecified: Secondary | ICD-10-CM

## 2019-08-01 DIAGNOSIS — G9341 Metabolic encephalopathy: Secondary | ICD-10-CM | POA: Insufficient documentation

## 2019-08-01 LAB — FERRITIN: Ferritin: 2139 ng/mL — ABNORMAL HIGH (ref 11–307)

## 2019-08-01 LAB — COMPREHENSIVE METABOLIC PANEL
ALT: 17 U/L (ref 0–44)
ALT: 17 U/L (ref 0–44)
AST: 27 U/L (ref 15–41)
AST: 28 U/L (ref 15–41)
Albumin: 3.3 g/dL — ABNORMAL LOW (ref 3.5–5.0)
Albumin: 3.3 g/dL — ABNORMAL LOW (ref 3.5–5.0)
Alkaline Phosphatase: 68 U/L (ref 38–126)
Alkaline Phosphatase: 69 U/L (ref 38–126)
Anion gap: 23 — ABNORMAL HIGH (ref 5–15)
Anion gap: 22 — ABNORMAL HIGH (ref 5–15)
BUN: 79 mg/dL — ABNORMAL HIGH (ref 8–23)
BUN: 85 mg/dL — ABNORMAL HIGH (ref 8–23)
CO2: 20 mmol/L — ABNORMAL LOW (ref 22–32)
CO2: 20 mmol/L — ABNORMAL LOW (ref 22–32)
Calcium: 8.6 mg/dL — ABNORMAL LOW (ref 8.9–10.3)
Calcium: 8.6 mg/dL — ABNORMAL LOW (ref 8.9–10.3)
Chloride: 101 mmol/L (ref 98–111)
Chloride: 101 mmol/L (ref 98–111)
Creatinine, Ser: 14.72 mg/dL — ABNORMAL HIGH (ref 0.44–1.00)
Creatinine, Ser: 14.32 mg/dL — ABNORMAL HIGH (ref 0.44–1.00)
GFR calc Af Amer: 3 mL/min — ABNORMAL LOW (ref >60)
GFR calc Af Amer: 3 mL/min — ABNORMAL LOW (ref >60)
GFR calc non Af Amer: 2 mL/min — ABNORMAL LOW (ref >60)
GFR calc non Af Amer: 2 mL/min — ABNORMAL LOW (ref >60)
Glucose, Bld: 130 mg/dL — ABNORMAL HIGH (ref 70–99)
Glucose, Bld: 80 mg/dL (ref 70–99)
Potassium: 6.2 mmol/L — ABNORMAL HIGH (ref 3.5–5.1)
Potassium: 5.9 mmol/L — ABNORMAL HIGH (ref 3.5–5.1)
Sodium: 144 mmol/L (ref 135–145)
Sodium: 143 mmol/L (ref 135–145)
Total Bilirubin: 0.7 mg/dL (ref 0.3–1.2)
Total Bilirubin: 0.7 mg/dL (ref 0.3–1.2)
Total Protein: 7.3 g/dL (ref 6.5–8.1)
Total Protein: 7.5 g/dL (ref 6.5–8.1)

## 2019-08-01 LAB — GLUCOSE, CAPILLARY
Glucose-Capillary: 100 mg/dL — ABNORMAL HIGH (ref 70–99)
Glucose-Capillary: 121 mg/dL — ABNORMAL HIGH (ref 70–99)
Glucose-Capillary: 133 mg/dL — ABNORMAL HIGH (ref 70–99)
Glucose-Capillary: 150 mg/dL — ABNORMAL HIGH (ref 70–99)
Glucose-Capillary: 153 mg/dL — ABNORMAL HIGH (ref 70–99)
Glucose-Capillary: 76 mg/dL (ref 70–99)

## 2019-08-01 LAB — CBC WITH DIFFERENTIAL/PLATELET
Abs Immature Granulocytes: 0.03 10*3/uL (ref 0.00–0.07)
Basophils Absolute: 0 10*3/uL (ref 0.0–0.1)
Basophils Relative: 0 %
Eosinophils Absolute: 0 10*3/uL (ref 0.0–0.5)
Eosinophils Relative: 0 %
HCT: 34.7 % — ABNORMAL LOW (ref 36.0–46.0)
Hemoglobin: 10.8 g/dL — ABNORMAL LOW (ref 12.0–15.0)
Immature Granulocytes: 0 %
Lymphocytes Relative: 11 %
Lymphs Abs: 1 10*3/uL (ref 0.7–4.0)
MCH: 27.5 pg (ref 26.0–34.0)
MCHC: 31.1 g/dL (ref 30.0–36.0)
MCV: 88.3 fL (ref 80.0–100.0)
Monocytes Absolute: 0.9 10*3/uL (ref 0.1–1.0)
Monocytes Relative: 10 %
Neutro Abs: 6.8 10*3/uL (ref 1.7–7.7)
Neutrophils Relative %: 79 %
Platelets: 149 10*3/uL — ABNORMAL LOW (ref 150–400)
RBC: 3.93 MIL/uL (ref 3.87–5.11)
RDW: 14.5 % (ref 11.5–15.5)
WBC: 8.7 10*3/uL (ref 4.0–10.5)
nRBC: 0 % (ref 0.0–0.2)

## 2019-08-01 LAB — TSH: TSH: 0.779 u[IU]/mL (ref 0.350–4.500)

## 2019-08-01 LAB — BLOOD GAS, ARTERIAL
Acid-base deficit: 2.9 mmol/L — ABNORMAL HIGH (ref 0.0–2.0)
Bicarbonate: 22 mmol/L (ref 20.0–28.0)
FIO2: 0.36
O2 Saturation: 90.2 %
Patient temperature: 37
pCO2 arterial: 38 mmHg (ref 32.0–48.0)
pH, Arterial: 7.37 (ref 7.350–7.450)
pO2, Arterial: 61 mmHg — ABNORMAL LOW (ref 83.0–108.0)

## 2019-08-01 LAB — BETA-HYDROXYBUTYRIC ACID: Beta-Hydroxybutyric Acid: 0.25 mmol/L (ref 0.05–0.27)

## 2019-08-01 LAB — MAGNESIUM: Magnesium: 2 mg/dL (ref 1.7–2.4)

## 2019-08-01 LAB — C-REACTIVE PROTEIN: CRP: 5 mg/dL — ABNORMAL HIGH (ref <1.0)

## 2019-08-01 LAB — AMMONIA: Ammonia: 14 umol/L (ref 9–35)

## 2019-08-01 LAB — FIBRIN DERIVATIVES D-DIMER (ARMC ONLY): Fibrin derivatives D-dimer (ARMC): 1612.36 ng/mL (FEU) — ABNORMAL HIGH (ref 0.00–499.00)

## 2019-08-01 NOTE — Progress Notes (Signed)
HD TX started    08/01/19 1145  Vital Signs  Pulse Rate 92  Pulse Rate Source Monitor  Resp (!) 22  BP (!) 102/52  BP Location Right Arm  BP Method Automatic  Patient Position (if appropriate) Lying  Oxygen Therapy  SpO2 95 %  O2 Device Nasal Cannula  O2 Flow Rate (L/min) 4 L/min  During Hemodialysis Assessment  Blood Flow Rate (mL/min) 400 mL/min  Arterial Pressure (mmHg) -230 mmHg  Venous Pressure (mmHg) 200 mmHg  Transmembrane Pressure (mmHg) 60 mmHg  Ultrafiltration Rate (mL/min) 330 mL/min  Dialysate Flow Rate (mL/min) 800 ml/min  Conductivity: Machine  14.1  HD Safety Checks Performed Yes  Dialysis Fluid Bolus Normal Saline  Bolus Amount (mL) 250 mL  Intra-Hemodialysis Comments Tx initiated  Fistula / Graft Left Forearm Arteriovenous fistula  No Placement Date or Time found.   Orientation: Left  Access Location: Forearm  Access Type: Arteriovenous fistula  Status Accessed  Needle Size 15g  Drainage Description None

## 2019-08-01 NOTE — ED Notes (Signed)
Lab called about sticking pt. Pt is fidgeting in bed. BP cuff placed back on pt.

## 2019-08-01 NOTE — ED Notes (Signed)
O2 increased to 3L for pulse ox persistent at 86%. Patient taken off the floor to dialysis.

## 2019-08-01 NOTE — Progress Notes (Signed)
HD TX completed, tolerated well, UF goal met    08/01/19 1415  Vital Signs  Pulse Rate 86  Pulse Rate Source Monitor  Resp 20  BP (!) 123/103  BP Location Right Arm  BP Method Automatic  Patient Position (if appropriate) Lying  Oxygen Therapy  SpO2 99 %  O2 Device Nasal Cannula  O2 Flow Rate (L/min) 4 L/min  Pulse Oximetry Type Continuous  During Hemodialysis Assessment  HD Safety Checks Performed Yes  KECN 69.3 KECN  Dialysis Fluid Bolus Normal Saline  Bolus Amount (mL) 250 mL  Intra-Hemodialysis Comments Tx completed;Tolerated well

## 2019-08-01 NOTE — ED Notes (Signed)
Pt sats are 88 on 2L while pt sleeping. Pt increased to 4L with no improvement. Pt increased to 6L of O2.

## 2019-08-01 NOTE — ED Notes (Signed)
Patient is resting comfortably. 

## 2019-08-01 NOTE — Progress Notes (Signed)
Pre HD Assessment    08/01/19 1150  Neurological  Level of Consciousness Alert  Orientation Level Oriented to person;Oriented to place  Respiratory  Respiratory Pattern Regular;Labored  Chest Assessment Chest expansion symmetrical  Bilateral Breath Sounds Rhonchi;Diminished  Cough None  Cardiac  Pulse Regular  Heart Sounds S1, S2  ECG Monitor Yes  Cardiac Rhythm NSR  Vascular  R Radial Pulse +2  L Radial Pulse +2  Edema Generalized  Psychosocial  Psychosocial (WDL) WDL

## 2019-08-01 NOTE — Progress Notes (Signed)
Report called to [facility] RN, patient to be transported to room.

## 2019-08-01 NOTE — ED Notes (Signed)
NP updated on recent status

## 2019-08-01 NOTE — Progress Notes (Signed)
Patient ID: Cynthia Dean, female   DOB: [birth year], 69 y.o.   MRN: [id number] [facility] PROGRESS NOTE  Cynthia Dean TWS:[id number] DOB: Jan 23, 1951 DOA: 07/30/2019 PCP: Lowella Bandy, MD  HPI/Subjective: Patient answers a few questions but is slow with her answers.  Mostly yes or no answers and not elaborating.  Patient having some diarrhea and fever.  Some shortness of breath and cough.  Objective: Vitals:   08/01/19 1000 08/01/19 1100  BP:  (!) 150/67  Pulse: 79 80  Resp:  (!) 21  Temp:  (!) 101.6 F (38.7 C)  SpO2: 94% 95%    Filed Weights   07/30/19 1913 07/30/19 2200 07/31/19 0037  Weight: 108.9 kg 108.9 kg 106.3 kg    ROS: Review of Systems  Constitutional: Negative for chills and fever.  Eyes: Negative for blurred vision.  Respiratory: Positive for cough and shortness of breath.   Cardiovascular: Negative for chest pain.  Gastrointestinal: Positive for diarrhea. Negative for abdominal pain, constipation, nausea and vomiting.  Genitourinary: Negative for dysuria.  Musculoskeletal: Negative for joint pain.  Neurological: Negative for dizziness and headaches.   Exam: Physical Exam  Constitutional: She is oriented to person, place, and time.  HENT:  Nose: No mucosal edema.  Mouth/Throat: No oropharyngeal exudate or posterior oropharyngeal edema.  Eyes: Conjunctivae and lids are normal.  Neck: Carotid bruit is not present.  Cardiovascular: S1 normal and S2 normal. Exam reveals no gallop.  Murmur heard.  Systolic murmur is present with a grade of 2/6. Respiratory: No respiratory distress. She has decreased breath sounds in the right lower field and the left lower field. She has no wheezes. She has rhonchi in the right lower field and the left lower field. She has no rales.  GI: Soft. Bowel sounds are normal. There is no abdominal tenderness.  Musculoskeletal:     Right ankle: Swelling present.     Left ankle: Swelling present.   Lymphadenopathy:    She has no cervical adenopathy.  Neurological: She is alert and oriented to person, place, and time. No cranial nerve deficit.  Skin: Skin is warm. No rash noted. Nails show no clubbing.  Psychiatric: She has a normal mood and affect.      Data Reviewed: Basic Metabolic Panel: Recent Labs  Lab 07/30/19 1957 07/31/19 0210 08/01/19 0409 08/01/19 0715  NA 134* 138 144 143  K 7.3* 5.6* 6.2* 5.9*  CL 97* 97* 101 101  CO2 18* 25 20* 20*  GLUCOSE 223* 210* 130* 80  BUN 84* 57* 79* 85*  CREATININE 16.50* 11.95* 14.72* 14.32*  CALCIUM 8.2* 8.6* 8.6* 8.6*  MG  --   --  2.0  --    Liver Function Tests: Recent Labs  Lab 07/30/19 1957 07/31/19 0210 08/01/19 0409 08/01/19 0715  AST 17 22 27 28   ALT 16 19 17 17   ALKPHOS 83 86 68 69  BILITOT 0.7 0.8 0.7 0.7  PROT 7.3 7.7 7.3 7.5  ALBUMIN 3.5 3.6 3.3* 3.3*   CBC: Recent Labs  Lab 07/30/19 1957 07/31/19 0210 08/01/19 0715  WBC 7.9 6.9 8.7  NEUTROABS 6.2 5.8 6.8  HGB 10.8* 11.1* 10.8*  HCT 34.3* 35.7* 34.7*  MCV 87.7 87.9 88.3  PLT 160 146* 149*   BNP (last 3 results) Recent Labs    12/31/18 0818 07/30/19 1957  BNP 528.0* 451.0*     CBG: Recent Labs  Lab 07/31/19 2031 08/01/19 0011 08/01/19 0405 08/01/19 0759 08/01/19 1146  GLUCAP 215* 133*  121* 76 100*    Recent Results (from the past 240 hour(s))  Culture, blood (Routine X 2) w Reflex to ID Panel     Status: None (Preliminary result)   Collection Time: 07/31/19 12:27 AM   Specimen: BLOOD  Result Value Ref Range Status   Specimen Description BLOOD RIGHT WRIST  Final   Special Requests   Final    BOTTLES DRAWN AEROBIC AND ANAEROBIC Blood Culture adequate volume   Culture   Final    NO GROWTH 1 DAY Performed at [facility], [address], [place], [place] [address]    Report Status PENDING  Incomplete  Culture, blood (Routine X 2) w Reflex to ID Panel     Status: None (Preliminary result)   Collection Time: 07/31/19  12:44 AM   Specimen: BLOOD  Result Value Ref Range Status   Specimen Description BLOOD BLOOD RIGHT WRIST  Final   Special Requests   Final    BOTTLES DRAWN AEROBIC AND ANAEROBIC Blood Culture adequate volume   Culture   Final    NO GROWTH 1 DAY Performed at [facility], [address] [place], [place] [address]    Report Status PENDING  Incomplete     Studies: DG Chest Port 1 View  Result Date: 07/30/2019 CLINICAL DATA:  Weakness EXAM: PORTABLE CHEST 1 VIEW COMPARISON:  03/23/2019 FINDINGS: Patchy opacities superimposed on chronic interstitial prominence. No significant pleural effusion. No pneumothorax. Cardiomediastinal contours are within normal limits. A loop recorder is again noted. IMPRESSION: Patchy opacities bilateral, which may reflect edema or atypical pneumonia. Electronically Signed   By: Macy Mis M.D.   On: 07/30/2019 20:08    Scheduled Meds: . amLODipine  5 mg Oral BID  . vitamin C  500 mg Oral Daily  . aspirin EC  325 mg Oral Daily  . atorvastatin  80 mg Oral QPM  . calcium acetate  2,001 mg Oral TID WC  . cholecalciferol  1,000 Units Oral Daily  . clopidogrel  75 mg Oral Daily  . dexamethasone (DECADRON) injection  6 mg Intravenous Q24H  . famotidine  10 mg Oral Daily  . furosemide  60 mg Intravenous Once  . gabapentin  200 mg Oral TID  . guaiFENesin  600 mg Oral BID  . heparin  5,000 Units Subcutaneous Q12H  . insulin aspart  0-20 Units Subcutaneous Q4H  . Ipratropium-Albuterol  1 puff Inhalation Q6H  . levETIRAcetam  1,000 mg Oral Daily  . levETIRAcetam  250 mg Oral Once per day on Mon Wed Fri  . metoprolol succinate  100 mg Oral Daily  . pantoprazole  40 mg Oral Daily  . torsemide  100 mg Oral Daily  . zinc sulfate  220 mg Oral Daily   Continuous Infusions: . sodium chloride    . sodium chloride    . azithromycin (ZITHROMAX) 500 MG IVPB (Vial-Mate [employer]) Stopped (08/01/19 0236)  . cefTRIAXone (ROCEPHIN)  IV Stopped (08/01/19 0349)   . remdesivir 100 mg in NS 100 mL      Assessment/Plan:  1. Acute on chronic hypoxic respiratory failure secondary to fluid overload from missing dialysis and COVID-19 pneumonia.  Patient was on 6 L when I saw her I dialed it down to 2 and looks like she had to be dialed back up to 3 L. 2. Acute diastolic congestive heart failure and fluid overload secondary to missing dialysis session.  Patient had urgent dialysis on admission and will have dialysis again today. 3.  Multifocal pneumonia secondary to Covid 19 with fever.  Remdesivir started 07/31/2019.  Day 2 of remdesivir today.  Continue steroids, vitamin C and zinc.  Combivent inhaler.  Continue on Rocephin and Zithromax. 4. Hyperkalemia with a potassium of 6.2.  Dialysis today to manage hyperkalemia 5. Acute metabolic encephalopathy likely secondary to Covid infection and fever. 6. Type 2 diabetes mellitus with end-stage renal disease.  Hemoglobin A1c elevated 8.3.  This morning sugar a little bit on the lower side and with poor appetite and altered mental status I held the Levemir for right now just doing sliding scale. 7. Hyperlipidemia unspecified on atorvastatin 8. Seizure history on Keppra 9. Essential hypertension on metoprolol and Norvasc  Code Status:     Code Status Orders  (From admission, onward)         Start     Ordered   07/30/19 2218  Full code  Continuous     07/30/19 2224        Code Status History    Date Active Date Inactive Code Status Order ID Comments User Context   02/13/2019 1329 02/16/2019 1849 Full Code [id number]  Lang Snow, NP ED   12/31/2018 1159 01/02/2019 1330 Full Code [id number]  Lang Snow, NP ED   05/29/2018 1541 05/30/2018 2041 Full Code [id number]  Loletha Grayer, MD ED   03/14/2018 0141 03/15/2018 0336 Full Code [id number]  Amelia Jo, MD Inpatient   03/08/2018 0005 03/09/2018 2108 Full Code [id number]  Vaughan Basta, MD Inpatient   11/15/2017 0017 11/17/2017 1809 Full  Code [id number]  Lance Coon, MD ED   11/12/2017 2219 11/13/2017 1935 Full Code [id number]  Lance Coon, MD ED   04/14/2016 1330 04/14/2016 1700 Full Code [id number]  Bettey Costa, MD ED   08/08/2015 2257 08/09/2015 2054 Full Code [id number]  Lavetta Nielsen, Aaron Mose, MD ED   04/17/2015 1334 04/17/2015 1836 Full Code [id number]  Dew, Erskine Squibb, MD Inpatient   Advance Care Planning Activity     Disposition Plan: To be determined based on clinical course.  Consultants:  Nephrology  Antibiotics: -Rocephin and Zithromax -Remdesivir  Time spent: 27 minutes.  Tried to reach the patient's daughter but mailbox is full.  [place]  [facility] [employer]

## 2019-08-01 NOTE — Progress Notes (Signed)
Post HD Assessment    08/01/19 1430  Neurological  Level of Consciousness Alert  Orientation Level Oriented to person;Oriented to place  Respiratory  Respiratory Pattern Regular  Chest Assessment Chest expansion symmetrical  Bilateral Breath Sounds Diminished  Cough None  Cardiac  Pulse Regular  Heart Sounds S1, S2  ECG Monitor Yes  Cardiac Rhythm NSR  Vascular  R Radial Pulse +2  L Radial Pulse +2  Edema Generalized  Psychosocial  Psychosocial (WDL) WDL

## 2019-08-01 NOTE — Progress Notes (Signed)
Pre HD TX    08/01/19 1130  Vital Signs  Temp 99 F (37.2 C)  Temp Source Oral  Pulse Rate 80  Pulse Rate Source Monitor  Resp (!) 22  BP (!) 148/64  BP Location Right Arm  BP Method Automatic  Patient Position (if appropriate) Lying  Oxygen Therapy  SpO2 91 %  O2 Device Nasal Cannula  O2 Flow Rate (L/min) 4 L/min  Pulse Oximetry Type Continuous  Dialysis Weight  Weight 106.3 kg  Type of Weight Pre-Dialysis  Time-Out for Hemodialysis  What Procedure? HD  Pt Identifiers(min of two) First/Last Name;MRN/Account#  Correct Site? Yes  Correct Side? Yes  Correct Procedure? Yes  Consents Verified? Yes  Rad Studies Available? N/A  Safety Precautions Reviewed? Yes  Engineer, civil (consulting) Number 3  Station Number  ([id number])  UF/Alarm Test Passed  Conductivity: Meter 13.6  Conductivity: Machine  13.6  pH 7.2  Reverse Osmosis WRO1  Normal Saline Lot Number [id number]  Dialyzer Lot Number [id number]  Disposable Set Lot Number [id number]  Machine Temperature 98.6 F (37 C)  Musician and Audible Yes  Blood Lines Intact and Secured Yes  Pre Treatment Patient Checks  Vascular access used during treatment Graft  Hepatitis B Surface Antigen Results Negative  Hepatitis B Surface Antibody  (>10)  Hemodialysis Consent Verified Yes  Hemodialysis Standing Orders Initiated Yes  ECG (Telemetry) Monitor On Yes  Prime Ordered Normal Saline  Length of  DialysisTreatment -hour(s) 2.5 Hour(s)  Dialysis Treatment Comments NA 140  Dialyzer Elisio 17H NR  Dialysate 2K;2.5 Ca  Dialysate Flow Ordered 800  Blood Flow Rate Ordered 400 mL/min  Ultrafiltration Goal 1 Liters  Dialysis Blood Pressure Support Ordered Normal Saline  Education / Care Plan  Dialysis Education Provided No (Comment)  Documented Education in Care Plan  (Pt confused )  Fistula / Graft Left Forearm Arteriovenous fistula  No Placement Date or Time found.   Orientation: Left  Access Location: Forearm  Access Type:  Arteriovenous fistula  Site Condition No complications  Fistula / Graft Assessment Present;Thrill;Bruit  Status Patent

## 2019-08-01 NOTE — Progress Notes (Signed)
PT Cancellation Note  Patient Details Name: Cynthia Dean MRN: [id number] DOB: 1951/05/07   Cancelled Treatment:    Reason Eval/Treat Not Completed: Medical issues which prohibited therapy(Consult received and chart reviewed. Patient noted with elevated potassium levels (5.9); contraindicated for exertional activity this date.  Pending dialysis today.  Will continue to follow and initiate as medically appropriate.)   [name] H. Owens Shark, PT, DPT, NCS 08/01/19, 9:49 AM [phone]

## 2019-08-01 NOTE — Progress Notes (Signed)
Post HD [facility]    08/01/19 1430  Hand-Off documentation  Report given to (Full Name) Halford Decamp, RN   Report received from (Full Name) Beatris Ship, RN   Vital Signs  Temp 99.3 F (37.4 C)  Temp Source Oral  Pulse Rate 88  Pulse Rate Source Monitor  Resp 20  BP (!) 139/112  BP Location Right Arm  BP Method Automatic  Patient Position (if appropriate) Lying  Oxygen Therapy  SpO2 99 %  O2 Device Nasal Cannula  O2 Flow Rate (L/min) 4 L/min  Pulse Oximetry Type Continuous  Pain Assessment  Pain Scale 0-10  Pain Score 0  Dialysis Weight  Weight 105.7 kg  Type of Weight Post-Dialysis  Fistula / Graft Left Forearm Arteriovenous fistula  No Placement Date or Time found.   Orientation: Left  Access Location: Forearm  Access Type: Arteriovenous fistula  Site Condition No complications  Fistula / Graft Assessment Present;Thrill;Bruit  Status Deaccessed  Drainage Description None

## 2019-08-01 NOTE — Progress Notes (Signed)
Hemodialysis patient known at [facility] MWF 6:00, Due to patient being COVID + dialysis center will be switched to [facility] TTS, need to confirm chair time.

## 2019-08-02 ENCOUNTER — Ambulatory Visit (INDEPENDENT_AMBULATORY_CARE_PROVIDER_SITE_OTHER): Payer: Medicare HMO | Admitting: *Deleted

## 2019-08-02 ENCOUNTER — Encounter: Payer: Self-pay | Admitting: Internal Medicine

## 2019-08-02 ENCOUNTER — Other Ambulatory Visit: Payer: Self-pay

## 2019-08-02 ENCOUNTER — Inpatient Hospital Stay: Payer: Medicare HMO

## 2019-08-02 DIAGNOSIS — I6310 Cerebral infarction due to embolism of unspecified precerebral artery: Secondary | ICD-10-CM | POA: Diagnosis not present

## 2019-08-02 DIAGNOSIS — R197 Diarrhea, unspecified: Secondary | ICD-10-CM

## 2019-08-02 LAB — CBC WITH DIFFERENTIAL/PLATELET
Abs Immature Granulocytes: 0.03 10*3/uL (ref 0.00–0.07)
Basophils Absolute: 0 10*3/uL (ref 0.0–0.1)
Basophils Relative: 0 %
Eosinophils Absolute: 0 10*3/uL (ref 0.0–0.5)
Eosinophils Relative: 0 %
HCT: 32.9 % — ABNORMAL LOW (ref 36.0–46.0)
Hemoglobin: 10.2 g/dL — ABNORMAL LOW (ref 12.0–15.0)
Immature Granulocytes: 0 %
Lymphocytes Relative: 17 %
Lymphs Abs: 1.2 10*3/uL (ref 0.7–4.0)
MCH: 27.4 pg (ref 26.0–34.0)
MCHC: 31 g/dL (ref 30.0–36.0)
MCV: 88.4 fL (ref 80.0–100.0)
Monocytes Absolute: 0.9 10*3/uL (ref 0.1–1.0)
Monocytes Relative: 12 %
Neutro Abs: 4.9 10*3/uL (ref 1.7–7.7)
Neutrophils Relative %: 71 %
Platelets: 126 10*3/uL — ABNORMAL LOW (ref 150–400)
RBC: 3.72 MIL/uL — ABNORMAL LOW (ref 3.87–5.11)
RDW: 14.6 % (ref 11.5–15.5)
WBC: 7 10*3/uL (ref 4.0–10.5)
nRBC: 0 % (ref 0.0–0.2)

## 2019-08-02 LAB — COMPREHENSIVE METABOLIC PANEL
ALT: 21 U/L (ref 0–44)
AST: 75 U/L — ABNORMAL HIGH (ref 15–41)
Albumin: 3.1 g/dL — ABNORMAL LOW (ref 3.5–5.0)
Alkaline Phosphatase: 62 U/L (ref 38–126)
Anion gap: 20 — ABNORMAL HIGH (ref 5–15)
BUN: 71 mg/dL — ABNORMAL HIGH (ref 8–23)
CO2: 22 mmol/L (ref 22–32)
Calcium: 8.4 mg/dL — ABNORMAL LOW (ref 8.9–10.3)
Chloride: 98 mmol/L (ref 98–111)
Creatinine, Ser: 11.84 mg/dL — ABNORMAL HIGH (ref 0.44–1.00)
GFR calc Af Amer: 3 mL/min — ABNORMAL LOW (ref >60)
GFR calc non Af Amer: 3 mL/min — ABNORMAL LOW (ref >60)
Glucose, Bld: 160 mg/dL — ABNORMAL HIGH (ref 70–99)
Potassium: 5.7 mmol/L — ABNORMAL HIGH (ref 3.5–5.1)
Sodium: 140 mmol/L (ref 135–145)
Total Bilirubin: 0.8 mg/dL (ref 0.3–1.2)
Total Protein: 7 g/dL (ref 6.5–8.1)

## 2019-08-02 LAB — MRSA PCR SCREENING: MRSA by PCR: NEGATIVE

## 2019-08-02 LAB — OCCULT BLOOD X 1 CARD TO LAB, STOOL: Fecal Occult Bld: POSITIVE — AB

## 2019-08-02 LAB — HEMOGLOBIN: Hemoglobin: 10.8 g/dL — ABNORMAL LOW (ref 12.0–15.0)

## 2019-08-02 LAB — GLUCOSE, CAPILLARY
Glucose-Capillary: 131 mg/dL — ABNORMAL HIGH (ref 70–99)
Glucose-Capillary: 165 mg/dL — ABNORMAL HIGH (ref 70–99)
Glucose-Capillary: 169 mg/dL — ABNORMAL HIGH (ref 70–99)
Glucose-Capillary: 227 mg/dL — ABNORMAL HIGH (ref 70–99)
Glucose-Capillary: 279 mg/dL — ABNORMAL HIGH (ref 70–99)
Glucose-Capillary: 297 mg/dL — ABNORMAL HIGH (ref 70–99)

## 2019-08-02 LAB — FIBRIN DERIVATIVES D-DIMER (ARMC ONLY): Fibrin derivatives D-dimer (ARMC): 1995.45 ng/mL (FEU) — ABNORMAL HIGH (ref 0.00–499.00)

## 2019-08-02 LAB — C-REACTIVE PROTEIN: CRP: 8.3 mg/dL — ABNORMAL HIGH (ref <1.0)

## 2019-08-02 LAB — FERRITIN: Ferritin: 3017 ng/mL — ABNORMAL HIGH (ref 11–307)

## 2019-08-02 MED ORDER — PANTOPRAZOLE SODIUM 40 MG IV SOLR
40.0000 mg | INTRAVENOUS | Status: DC
  Administered 2019-08-02 – 2019-08-06 (×5): 40 mg via INTRAVENOUS
  Filled 2019-08-02 (×5): qty 40

## 2019-08-02 MED ORDER — INSULIN ASPART 100 UNIT/ML ~~LOC~~ SOLN: 3.0000 [IU] | Freq: Three times a day (TID) | SUBCUTANEOUS | Status: DC

## 2019-08-02 MED ORDER — INSULIN ASPART 100 UNIT/ML ~~LOC~~ SOLN
0.0000 [IU] | Freq: Every day | SUBCUTANEOUS | Status: DC
  Administered 2019-08-02 – 2019-08-04 (×3): 2 [IU] via SUBCUTANEOUS
  Administered 2019-08-05: 3 [IU] via SUBCUTANEOUS
  Administered 2019-08-10: 2 [IU] via SUBCUTANEOUS
  Filled 2019-08-02 (×5): qty 1

## 2019-08-02 MED ORDER — INSULIN ASPART 100 UNIT/ML ~~LOC~~ SOLN
0.0000 [IU] | Freq: Three times a day (TID) | SUBCUTANEOUS | Status: DC
  Administered 2019-08-02: 3 [IU] via SUBCUTANEOUS
  Administered 2019-08-03: 1 [IU] via SUBCUTANEOUS
  Administered 2019-08-03 – 2019-08-04 (×2): 4 [IU] via SUBCUTANEOUS
  Administered 2019-08-04: 3 [IU] via SUBCUTANEOUS
  Administered 2019-08-04: 2 [IU] via SUBCUTANEOUS
  Administered 2019-08-05: 1 [IU] via SUBCUTANEOUS
  Administered 2019-08-05: 2 [IU] via SUBCUTANEOUS
  Administered 2019-08-05 – 2019-08-06 (×2): 1 [IU] via SUBCUTANEOUS
  Administered 2019-08-06: 2 [IU] via SUBCUTANEOUS
  Administered 2019-08-06: 3 [IU] via SUBCUTANEOUS
  Administered 2019-08-07 – 2019-08-10 (×8): 1 [IU] via SUBCUTANEOUS
  Administered 2019-08-10: 2 [IU] via SUBCUTANEOUS
  Administered 2019-08-11: 3 [IU] via SUBCUTANEOUS
  Administered 2019-08-11: 5 [IU] via SUBCUTANEOUS
  Filled 2019-08-02 (×23): qty 1

## 2019-08-02 MED ORDER — INSULIN ASPART 100 UNIT/ML ~~LOC~~ SOLN: 0.0000 [IU] | Freq: Three times a day (TID) | SUBCUTANEOUS | Status: DC

## 2019-08-02 MED ORDER — QUETIAPINE FUMARATE 25 MG PO TABS
12.5000 mg | ORAL_TABLET | Freq: Every day | ORAL | Status: DC
  Administered 2019-08-02: 12.5 mg via ORAL
  Filled 2019-08-02: qty 1

## 2019-08-02 MED ORDER — LOPERAMIDE HCL 2 MG PO CAPS
2.0000 mg | ORAL_CAPSULE | Freq: Three times a day (TID) | ORAL | Status: DC | PRN
  Administered 2019-08-03 – 2019-08-04 (×2): 2 mg via ORAL
  Filled 2019-08-02 (×2): qty 1

## 2019-08-02 MED ORDER — INSULIN ASPART 100 UNIT/ML ~~LOC~~ SOLN: 0.0000 [IU] | Freq: Every day | SUBCUTANEOUS | Status: DC

## 2019-08-02 MED ORDER — INSULIN DETEMIR 100 UNIT/ML ~~LOC~~ SOLN
8.0000 [IU] | Freq: Two times a day (BID) | SUBCUTANEOUS | Status: DC
  Administered 2019-08-02 – 2019-08-11 (×17): 8 [IU] via SUBCUTANEOUS
  Filled 2019-08-02 (×20): qty 0.08

## 2019-08-02 MED ORDER — INSULIN DETEMIR 100 UNIT/ML ~~LOC~~ SOLN: 6.0000 [IU] | Freq: Every day | SUBCUTANEOUS | Status: DC

## 2019-08-02 MED ORDER — INSULIN ASPART 100 UNIT/ML ~~LOC~~ SOLN
3.0000 [IU] | Freq: Three times a day (TID) | SUBCUTANEOUS | Status: DC
  Administered 2019-08-02 – 2019-08-11 (×23): 3 [IU] via SUBCUTANEOUS
  Filled 2019-08-02 (×23): qty 1

## 2019-08-02 NOTE — Progress Notes (Signed)
[facility]  ROUNDING NOTE   Subjective:  Patient underwent hemodialysis yesterday. Tolerated well. Potassium still a bit high at 5.7 but overall down from admission.  Objective:  Vital signs in last 24 hours:  Temp:  [99.2 F (37.3 C)-99.9 F (37.7 C)] 99.2 F (37.3 C) (01/08 1207) Pulse Rate:  [79-88] 80 (01/08 1207) Resp:  [18-20] 18 (01/08 1207) BP: (122-177)/(57-113) 133/69 (01/08 1207) SpO2:  [90 %-99 %] 90 % (01/08 1207) Weight:  [105.7 kg] 105.7 kg (01/07 1430)  Weight change:  Filed Weights   07/31/19 0037 08/01/19 1130 08/01/19 1430  Weight: 106.3 kg 106.3 kg 105.7 kg    Intake/Output: No intake/output data recorded.   Intake/Output this shift:  No intake/output data recorded.  Physical Exam: Exam deferred in an effort to limit spread of COVID-19 and to preserve PPE.  Basic Metabolic Panel: Recent Labs  Lab 07/30/19 1957 07/31/19 0210 08/01/19 0409 08/01/19 0715 08/02/19 0214  NA 134* 138 144 143 140  K 7.3* 5.6* 6.2* 5.9* 5.7*  CL 97* 97* 101 101 98  CO2 18* 25 20* 20* 22  GLUCOSE 223* 210* 130* 80 160*  BUN 84* 57* 79* 85* 71*  CREATININE 16.50* 11.95* 14.72* 14.32* 11.84*  CALCIUM 8.2* 8.6* 8.6* 8.6* 8.4*  MG  --   --  2.0  --   --     Liver Function Tests: Recent Labs  Lab 07/30/19 1957 07/31/19 0210 08/01/19 0409 08/01/19 0715 08/02/19 0214  AST 17 22 27 28  75*  ALT 16 19 17 17 21   ALKPHOS 83 86 68 69 62  BILITOT 0.7 0.8 0.7 0.7 0.8  PROT 7.3 7.7 7.3 7.5 7.0  ALBUMIN 3.5 3.6 3.3* 3.3* 3.1*   No results for input(s): LIPASE, AMYLASE in the last 168 hours. Recent Labs  Lab 08/01/19 0409  AMMONIA 14    CBC: Recent Labs  Lab 07/30/19 1957 07/31/19 0210 08/01/19 0715 08/02/19 0214  WBC 7.9 6.9 8.7 7.0  NEUTROABS 6.2 5.8 6.8 4.9  HGB 10.8* 11.1* 10.8* 10.2*  HCT 34.3* 35.7* 34.7* 32.9*  MCV 87.7 87.9 88.3 88.4  PLT 160 146* 149* 126*    Cardiac Enzymes: No results for input(s): CKTOTAL, CKMB, CKMBINDEX,  TROPONINI in the last 168 hours.  BNP: Invalid input(s): POCBNP  CBG: Recent Labs  Lab 08/01/19 1949 08/02/19 0003 08/02/19 0505 08/02/19 0739 08/02/19 1206  GLUCAP 153* 131* 169* 165* 297*    Microbiology: Results for orders placed or performed during the hospital encounter of 07/30/19  Culture, blood (Routine X 2) w Reflex to ID Panel     Status: None (Preliminary result)   Collection Time: 07/31/19 12:27 AM   Specimen: BLOOD  Result Value Ref Range Status   Specimen Description BLOOD RIGHT WRIST  Final   Special Requests   Final    BOTTLES DRAWN AEROBIC AND ANAEROBIC Blood Culture adequate volume   Culture   Final    NO GROWTH 2 DAYS Performed at [facility], [address] [place], [place] [address]    Report Status PENDING  Incomplete  Culture, blood (Routine X 2) w Reflex to ID Panel     Status: None (Preliminary result)   Collection Time: 07/31/19 12:44 AM   Specimen: BLOOD  Result Value Ref Range Status   Specimen Description BLOOD BLOOD RIGHT WRIST  Final   Special Requests   Final    BOTTLES DRAWN AEROBIC AND ANAEROBIC Blood Culture adequate volume   Culture  Final    NO GROWTH 2 DAYS Performed at [facility], [address] [place], [place] [address]    Report Status PENDING  Incomplete  MRSA PCR Screening     Status: None   Collection Time: 08/01/19 11:12 PM   Specimen: Nasopharyngeal  Result Value Ref Range Status   MRSA by PCR NEGATIVE NEGATIVE Final    Comment:        The GeneXpert MRSA Assay (FDA approved for NASAL specimens only), is one component of a comprehensive MRSA colonization surveillance program. It is not intended to diagnose MRSA infection nor to guide or monitor treatment for MRSA infections. Performed at [facility], [address] [place], [place] [address]     Coagulation Studies: Recent Labs    07/30/19 1957  LABPROT 14.1  INR 1.1    Urinalysis: No results for input(s):  COLORURINE, LABSPEC, PHURINE, GLUCOSEU, HGBUR, BILIRUBINUR, KETONESUR, PROTEINUR, UROBILINOGEN, NITRITE, LEUKOCYTESUR in the last 72 hours.  Invalid input(s): APPERANCEUR    Imaging: No results found.   Medications:   . sodium chloride    . sodium chloride    . azithromycin (ZITHROMAX) 500 MG IVPB (Vial-Mate [employer]or) Stopped (08/01/19 0236)  . cefTRIAXone (ROCEPHIN)  IV Stopped (08/01/19 0349)  . remdesivir 100 mg in NS 100 mL Stopped (08/01/19 1817)   . amLODipine  5 mg Oral BID  . vitamin C  500 mg Oral Daily  . atorvastatin  80 mg Oral QPM  . calcium acetate  2,001 mg Oral TID WC  . cholecalciferol  1,000 Units Oral Daily  . dexamethasone (DECADRON) injection  6 mg Intravenous Q24H  . furosemide  60 mg Intravenous Once  . gabapentin  200 mg Oral TID  . guaiFENesin  600 mg Oral BID  . insulin aspart  0-20 Units Subcutaneous Q4H  . Ipratropium-Albuterol  1 puff Inhalation Q6H  . levETIRAcetam  1,000 mg Oral Daily  . levETIRAcetam  250 mg Oral Once per day on Mon Wed Fri  . metoprolol succinate  100 mg Oral Daily  . pantoprazole (PROTONIX) IV  40 mg Intravenous Q24H  . torsemide  100 mg Oral Daily  . zinc sulfate  220 mg Oral Daily   sodium chloride, sodium chloride, acetaminophen, chlorpheniramine-HYDROcodone, guaiFENesin-dextromethorphan, heparin, heparin, lidocaine-prilocaine, loperamide, magnesium hydroxide, ondansetron **OR** ondansetron (ZOFRAN) IV, pentafluoroprop-tetrafluoroeth, traZODone  Assessment/ Plan:  69 y.o. 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  [facility]/MWF/[facility] [place] [facility]/ 225 min  1.  ESRD on HD MWF but temporarily on TTS.  Patient likely to be transitioning to cohort dialysis unit.  Therefore she will need TTS dialysis seat.  We will plan for hemodialysis again tomorrow.  Patient underwent dialysis yesterday.  2.  Anemia of chronic kidney disease.  Hemoglobin 10.2.  Hold off on Epogen for now.  3.  Secondary hyperparathyroidism.  Check serum phosphorus tomorrow.  4.  COVID-19 infection.  Patient on dexamethasone and remdesivir.   LOS: 3 Cyrene Gharibian 1/8/202112:34 PM

## 2019-08-02 NOTE — Progress Notes (Signed)
PT Cancellation Note  Patient Details Name: Cynthia Dean MRN: [id number] DOB: 1951-01-25   Cancelled Treatment:    Reason Eval/Treat Not Completed: Medical issues which prohibited therapy(Per chart review, patient continues with elevated potassium levels (5.7); outside range for safe exertional activity.  Will continue to follow and initiate as appropriate.)   Dazia Lippold H. Owens Shark, PT, DPT, NCS 08/02/19, 9:19 AM [phone]

## 2019-08-02 NOTE — Progress Notes (Signed)
Patient too confused to answer admission questions at this time. Will continue to monitor.

## 2019-08-02 NOTE — Progress Notes (Signed)
Spoke to patients oldest sister Berlinda Last. She wanted to inform the MD that Ms Drum has had episodes of vomiting and confusion post HD when a lot of fluid was pulled off. She wanted everyone to be aware and hopefully prevent any further issues with cognition.

## 2019-08-02 NOTE — Progress Notes (Signed)
Inpatient Diabetes Program Recommendations  AACE/ADA: New Consensus Statement on Inpatient Glycemic Control (2015)  Target Ranges:  Prepandial:   less than 140 mg/dL      Peak postprandial:   less than 180 mg/dL (1-2 hours)      Critically ill patients:  140 - 180 mg/dL   Lab Results  Component Value Date   GLUCAP 297 (H) 08/02/2019   HGBA1C 8.3 (H) 07/31/2019    Diabetes history: DM2 Outpatient Diabetes medications: 70/30 25 units BID (does not take on dialysis days) Current orders for Inpatient glycemic control:  Novolog 0-6 units tid with meals and Novolog HS scale Decadron 6 mg daily Inpatient Diabetes Program Recommendations:    Note that Levemir stopped on 07/31/19- blood sugars were controlled on 1/7 however now starting to trend up.  Please consider restarting Levemir 8 units bid and add Novolog meal coverage 3 units tid with meals (hold if patient eats less than 50%).   Thanks  Adah Perl, RN, BC-ADM Inpatient Diabetes Coordinator Pager [phone] (8a-5p)

## 2019-08-02 NOTE — Progress Notes (Signed)
Patient ID: Cynthia Dean, female   DOB: July 21, 1951, 69 y.o.   MRN: [id number]  I came back to see patient.  Nursing staff was concerned about patient's altered mental status.  She was slow and deliberate with her answers.  She was able to move all of her extremities.  Her mental status has waxed and waned so far throughout the entire hospital course.  I ordered a CT scan of the head earlier.  Needed to hold aspirin and Plavix with the occult blood positive in the stool.  We will continue to monitor.  I also stopped the patient's gabapentin which could cause altered mental status also.  Dr Loletha Grayer

## 2019-08-02 NOTE — Progress Notes (Signed)
Patient unable to form a complete sentence. Words are jumbled and make no sense in relation to the questions being asked. Notified Dr Leslye Peer. MD ordered a stat Head CT.

## 2019-08-02 NOTE — Care Management Important Message (Signed)
Important Message  Patient Details  Name: Cynthia Dean MRN: [id number] Date of Birth: [birth year]   Medicare Important Message Given:  Yes  Initial Medicare IM given by Patient Access Associate on 07/31/2019 at 6:55am.    [name] 08/02/2019, 8:43 AM

## 2019-08-02 NOTE — Progress Notes (Signed)
Patient ID: Cynthia Dean, female   DOB: [birth year], 69 y.o.   MRN: [id number] [facility] PROGRESS NOTE  Cynthia Dean KGU:[id number] DOB: Aug 08, 1950 DOA: 07/30/2019 PCP: Cynthia Bandy, MD  HPI/Subjective: Patient more alert today.  When I walked in the room she had a large bowel movement that was blackish green.  Cynthia Dean states that she was having diarrhea at home prior to coming in and he was also having diarrhea.  Objective: Vitals:   08/02/19 0506 08/02/19 1207  BP: (!) 149/58 133/69  Pulse: 82 80  Resp: 18 18  Temp: 99.9 F (37.7 C) 99.2 F (37.3 C)  SpO2: 93% 90%    Filed Weights   07/31/19 0037 08/01/19 1130 08/01/19 1430  Weight: 106.3 kg 106.3 kg 105.7 kg    ROS: Review of Systems  Unable to perform ROS: Acuity of condition  Respiratory: Positive for cough and shortness of breath.   Cardiovascular: Negative for chest pain.  Gastrointestinal: Positive for diarrhea. Negative for abdominal pain, nausea and vomiting.   Exam: Physical Exam  Constitutional: She is oriented to person, place, and time.  HENT:  Nose: No mucosal edema.  Mouth/Throat: No oropharyngeal exudate or posterior oropharyngeal edema.  Eyes: Conjunctivae and lids are normal.  Neck: Carotid bruit is not present.  Cardiovascular: S1 normal and S2 normal. Exam reveals no gallop.  Murmur heard.  Systolic murmur is present with a grade of 2/6. Respiratory: No respiratory distress. She has decreased breath sounds in the right lower field and the left lower field. She has no wheezes. She has rhonchi in the right lower field and the left lower field. She has no rales.  GI: Soft. Bowel sounds are normal. There is no abdominal tenderness.  Musculoskeletal:     Right ankle: Swelling present.     Left ankle: Swelling present.  Lymphadenopathy:    She has no cervical adenopathy.  Neurological: She is alert and oriented to person, place, and time. No cranial nerve deficit.  Skin:  Skin is warm. No rash noted. Nails show no clubbing.  Psychiatric: She has a normal mood and affect.      Data Reviewed: Basic Metabolic Panel: Recent Labs  Lab 07/30/19 1957 07/31/19 0210 08/01/19 0409 08/01/19 0715 08/02/19 0214  NA 134* 138 144 143 140  K 7.3* 5.6* 6.2* 5.9* 5.7*  CL 97* 97* 101 101 98  CO2 18* 25 20* 20* 22  GLUCOSE 223* 210* 130* 80 160*  BUN 84* 57* 79* 85* 71*  CREATININE 16.50* 11.95* 14.72* 14.32* 11.84*  CALCIUM 8.2* 8.6* 8.6* 8.6* 8.4*  MG  --   --  2.0  --   --    Liver Function Tests: Recent Labs  Lab 07/30/19 1957 07/31/19 0210 08/01/19 0409 08/01/19 0715 08/02/19 0214  AST 17 22 27 28  75*  ALT 16 19 17 17 21   ALKPHOS 83 86 68 69 62  BILITOT 0.7 0.8 0.7 0.7 0.8  PROT 7.3 7.7 7.3 7.5 7.0  ALBUMIN 3.5 3.6 3.3* 3.3* 3.1*   CBC: Recent Labs  Lab 07/30/19 1957 07/31/19 0210 08/01/19 0715 08/02/19 0214 08/02/19 1327  WBC 7.9 6.9 8.7 7.0  --   NEUTROABS 6.2 5.8 6.8 4.9  --   HGB 10.8* 11.1* 10.8* 10.2* 10.8*  HCT 34.3* 35.7* 34.7* 32.9*  --   MCV 87.7 87.9 88.3 88.4  --   PLT 160 146* 149* 126*  --    BNP (last 3 results) Recent Labs  12/31/18 0818 07/30/19 1957  BNP 528.0* 451.0*     CBG: Recent Labs  Lab 08/01/19 1949 08/02/19 0003 08/02/19 0505 08/02/19 0739 08/02/19 1206  GLUCAP 153* 131* 169* 165* 297*    Recent Results (from the past 240 hour(s))  Culture, blood (Routine X 2) w Reflex to ID Panel     Status: None (Preliminary result)   Collection Time: 07/31/19 12:27 AM   Specimen: BLOOD  Result Value Ref Range Status   Specimen Description BLOOD RIGHT WRIST  Final   Special Requests   Final    BOTTLES DRAWN AEROBIC AND ANAEROBIC Blood Culture adequate volume   Culture   Final    NO GROWTH 2 DAYS Performed at [facility], [address] [place], [place] [address]    Report Status PENDING  Incomplete  Culture, blood (Routine X 2) w Reflex to ID Panel     Status: None (Preliminary result)    Collection Time: 07/31/19 12:44 AM   Specimen: BLOOD  Result Value Ref Range Status   Specimen Description BLOOD BLOOD RIGHT WRIST  Final   Special Requests   Final    BOTTLES DRAWN AEROBIC AND ANAEROBIC Blood Culture adequate volume   Culture   Final    NO GROWTH 2 DAYS Performed at [facility], [address] [place], [place] [address]    Report Status PENDING  Incomplete  MRSA PCR Screening     Status: None   Collection Time: 08/01/19 11:12 PM   Specimen: Nasopharyngeal  Result Value Ref Range Status   MRSA by PCR NEGATIVE NEGATIVE Final    Comment:        The GeneXpert MRSA Assay (FDA approved for NASAL specimens only), is one component of a comprehensive MRSA colonization surveillance program. It is not intended to diagnose MRSA infection nor to guide or monitor treatment for MRSA infections. Performed at [facility], [address] [place], [place] [address]     Scheduled Meds: . amLODipine  5 mg Oral BID  . vitamin C  500 mg Oral Daily  . atorvastatin  80 mg Oral QPM  . calcium acetate  2,001 mg Oral TID WC  . cholecalciferol  1,000 Units Oral Daily  . dexamethasone (DECADRON) injection  6 mg Intravenous Q24H  . furosemide  60 mg Intravenous Once  . guaiFENesin  600 mg Oral BID  . insulin aspart  0-5 Units Subcutaneous QHS  . insulin aspart  0-6 Units Subcutaneous TID WC  . insulin aspart  3 Units Subcutaneous TID WC  . insulin detemir  8 Units Subcutaneous BID  . Ipratropium-Albuterol  1 puff Inhalation Q6H  . levETIRAcetam  1,000 mg Oral Daily  . levETIRAcetam  250 mg Oral Once per day on Mon Wed Fri  . metoprolol succinate  100 mg Oral Daily  . pantoprazole (PROTONIX) IV  40 mg Intravenous Q24H  . torsemide  100 mg Oral Daily  . zinc sulfate  220 mg Oral Daily   Continuous Infusions: . sodium chloride    . sodium chloride    . azithromycin (ZITHROMAX) 500 MG IVPB (Vial-Mate [employer]or) Stopped (08/01/19 0236)  . cefTRIAXone  (ROCEPHIN)  IV Stopped (08/01/19 0349)  . remdesivir 100 mg in NS 100 mL Stopped (08/01/19 1817)    Assessment/Plan:  1. Acute on chronic hypoxic respiratory failure secondary to fluid overload from missing dialysis and COVID-19 pneumonia.  Continue oxygen supplementation at 2 L nasal cannula. 2. Acute diastolic congestive heart failure and  fluid overload. Patient has had 2 dialysis sessions this hospitalization so far. 3. Multifocal pneumonia secondary to Covid 19 with fever.  Remdesivir started 07/31/2019.  Day 3 of remdesivir today.  Continue steroids, vitamin C and zinc.  Combivent inhaler.  Continue on Rocephin and Zithromax. 4. Diarrhea.  Likely secondary to Covid infection.  As needed Imodium.  Rectal tube placed.  Stool guaiac pending.  Hemoglobin stable.  Repeat hemoglobin 10.8.  I did stop aspirin and Plavix for right now just in case there is any bleeding. 5. Hyperkalemia with a potassium of 5.7.  Dialysis tomorrow to manage hyperkalemia 6. Acute metabolic encephalopathy likely secondary to Covid infection and fever.  Stop gabapentin. 7. Type 2 diabetes mellitus with end-stage renal disease.  Hemoglobin A1c elevated 8.3.  Restart Levemir and short acting insulin.  Continue sliding scale. 8. Hyperlipidemia unspecified on atorvastatin 9. Seizure history on Keppra 10. Essential hypertension on metoprolol and Norvasc  Code Status:     Code Status Orders  (From admission, onward)         Start     Ordered   07/30/19 2218  Full code  Continuous     07/30/19 2224        Code Status History    Date Active Date Inactive Code Status Order ID Comments User Context   02/13/2019 1329 02/16/2019 1849 Full Code [id number]  Lang Snow, NP ED   12/31/2018 1159 01/02/2019 1330 Full Code [id number]  Lang Snow, NP ED   05/29/2018 1541 05/30/2018 2041 Full Code [id number]  Loletha Grayer, MD ED   03/14/2018 0141 03/15/2018 0336 Full Code [id number]  Amelia Jo, MD Inpatient    03/08/2018 0005 03/09/2018 2108 Full Code [id number]  Vaughan Basta, MD Inpatient   11/15/2017 0017 11/17/2017 1809 Full Code [id number]  Lance Coon, MD ED   11/12/2017 2219 11/13/2017 1935 Full Code [id number]  Lance Coon, MD ED   04/14/2016 1330 04/14/2016 1700 Full Code [id number]  Bettey Costa, MD ED   08/08/2015 2257 08/09/2015 2054 Full Code [id number]  Lavetta Nielsen, Aaron Mose, MD ED   04/17/2015 1334 04/17/2015 1836 Full Code [id number]  Dew, Erskine Squibb, MD Inpatient   Advance Care Planning Activity     Disposition Plan: To be determined based on clinical course.  Consultants:  Nephrology  Antibiotics: -Rocephin and Zithromax -Remdesivir  Time spent: 28 minutes.  Spoke with patient's daughter on the phone yesterday and fianc on the phone today.  Owen  [facility] [employer]

## 2019-08-03 LAB — COMPREHENSIVE METABOLIC PANEL
ALT: 20 U/L (ref 0–44)
AST: 71 U/L — ABNORMAL HIGH (ref 15–41)
Albumin: 2.9 g/dL — ABNORMAL LOW (ref 3.5–5.0)
Alkaline Phosphatase: 57 U/L (ref 38–126)
Anion gap: 25 — ABNORMAL HIGH (ref 5–15)
BUN: 114 mg/dL — ABNORMAL HIGH (ref 8–23)
CO2: 19 mmol/L — ABNORMAL LOW (ref 22–32)
Calcium: 9.2 mg/dL (ref 8.9–10.3)
Chloride: 98 mmol/L (ref 98–111)
Creatinine, Ser: 14.22 mg/dL — ABNORMAL HIGH (ref 0.44–1.00)
GFR calc Af Amer: 3 mL/min — ABNORMAL LOW (ref >60)
GFR calc non Af Amer: 2 mL/min — ABNORMAL LOW (ref >60)
Glucose, Bld: 227 mg/dL — ABNORMAL HIGH (ref 70–99)
Potassium: 6 mmol/L — ABNORMAL HIGH (ref 3.5–5.1)
Sodium: 142 mmol/L (ref 135–145)
Total Bilirubin: 0.8 mg/dL (ref 0.3–1.2)
Total Protein: 6.6 g/dL (ref 6.5–8.1)

## 2019-08-03 LAB — CBC WITH DIFFERENTIAL/PLATELET
Abs Immature Granulocytes: 0.04 10*3/uL (ref 0.00–0.07)
Basophils Absolute: 0 10*3/uL (ref 0.0–0.1)
Basophils Relative: 0 %
Eosinophils Absolute: 0 10*3/uL (ref 0.0–0.5)
Eosinophils Relative: 0 %
HCT: 33.4 % — ABNORMAL LOW (ref 36.0–46.0)
Hemoglobin: 10.1 g/dL — ABNORMAL LOW (ref 12.0–15.0)
Immature Granulocytes: 1 %
Lymphocytes Relative: 20 %
Lymphs Abs: 1.5 10*3/uL (ref 0.7–4.0)
MCH: 26.9 pg (ref 26.0–34.0)
MCHC: 30.2 g/dL (ref 30.0–36.0)
MCV: 88.8 fL (ref 80.0–100.0)
Monocytes Absolute: 0.6 10*3/uL (ref 0.1–1.0)
Monocytes Relative: 8 %
Neutro Abs: 5.1 10*3/uL (ref 1.7–7.7)
Neutrophils Relative %: 71 %
Platelets: 145 10*3/uL — ABNORMAL LOW (ref 150–400)
RBC: 3.76 MIL/uL — ABNORMAL LOW (ref 3.87–5.11)
RDW: 14.8 % (ref 11.5–15.5)
WBC: 7.2 10*3/uL (ref 4.0–10.5)
nRBC: 0 % (ref 0.0–0.2)

## 2019-08-03 LAB — FIBRIN DERIVATIVES D-DIMER (ARMC ONLY): Fibrin derivatives D-dimer (ARMC): 1741.47 ng/mL (FEU) — ABNORMAL HIGH (ref 0.00–499.00)

## 2019-08-03 LAB — GLUCOSE, CAPILLARY
Glucose-Capillary: 194 mg/dL — ABNORMAL HIGH (ref 70–99)
Glucose-Capillary: 330 mg/dL — ABNORMAL HIGH (ref 70–99)
Glucose-Capillary: 226 mg/dL — ABNORMAL HIGH (ref 70–99)

## 2019-08-03 LAB — FERRITIN: Ferritin: 4470 ng/mL — ABNORMAL HIGH (ref 11–307)

## 2019-08-03 LAB — C-REACTIVE PROTEIN: CRP: 7.2 mg/dL — ABNORMAL HIGH (ref <1.0)

## 2019-08-03 MED ORDER — SODIUM CHLORIDE 0.9 % IV SOLN
INTRAVENOUS | Status: DC | PRN
  Administered 2019-08-03 – 2019-08-04 (×2): 30 mL via INTRAVENOUS

## 2019-08-03 MED ORDER — METOPROLOL SUCCINATE ER 50 MG PO TB24
50.0000 mg | ORAL_TABLET | Freq: Every day | ORAL | Status: DC
  Administered 2019-08-04 – 2019-08-11 (×7): 50 mg via ORAL
  Filled 2019-08-03 (×8): qty 1

## 2019-08-03 NOTE — Progress Notes (Signed)
Patient ID: Cynthia Dean, female   DOB: Dec 01, 1950, 69 y.o.   MRN: [id number] [facility] PROGRESS NOTE  Cynthia Dean MBE:[id number] DOB: 1951/04/21 DOA: 07/30/2019 PCP: Lowella Bandy, MD  HPI/Subjective: Patient seen down on dialysis.  Nephrologist there also.  Answers only few questions.  Able to follow some simple commands like wiggling toes and lifting up her right arm and squeezing my hand with her left hand.  Objective: Vitals:   08/03/19 0506 08/03/19 1045  BP: (!) 126/48 138/65  Pulse: 69 72  Resp: 17 18  Temp: (!) 97.5 F (36.4 C) 98.8 F (37.1 C)  SpO2: 93% (!) 88%    Filed Weights   08/01/19 1430 08/03/19 0037 08/03/19 0501  Weight: 105.7 kg 105.7 kg 133.5 kg    ROS: Review of Systems  Unable to perform ROS: Acuity of condition  Respiratory: Negative for shortness of breath.   Cardiovascular: Negative for chest pain.  Gastrointestinal: Negative for abdominal pain.   Exam: Physical Exam  Constitutional: She is oriented to person, place, and time.  HENT:  Nose: No mucosal edema.  Mouth/Throat: No oropharyngeal exudate or posterior oropharyngeal edema.  Eyes: Conjunctivae and lids are normal.  Neck: Carotid bruit is not present.  Cardiovascular: S1 normal and S2 normal. Exam reveals no gallop.  Murmur heard.  Systolic murmur is present with a grade of 2/6. Respiratory: No respiratory distress. She has decreased breath sounds in the right lower field and the left lower field. She has no wheezes. She has rhonchi in the right lower field and the left lower field. She has no rales.  GI: Soft. Bowel sounds are normal. There is no abdominal tenderness.  Musculoskeletal:     Right ankle: Swelling present.     Left ankle: Swelling present.  Lymphadenopathy:    She has no cervical adenopathy.  Neurological: She is alert and oriented to person, place, and time. No cranial nerve deficit.  Skin: Skin is warm. No rash noted. Nails show no  clubbing.  Psychiatric: She has a normal mood and affect.      Data Reviewed: Basic Metabolic Panel: Recent Labs  Lab 07/31/19 0210 08/01/19 0409 08/01/19 0715 08/02/19 0214 08/03/19 0622  NA 138 144 143 140 142  K 5.6* 6.2* 5.9* 5.7* 6.0*  CL 97* 101 101 98 98  CO2 25 20* 20* 22 19*  GLUCOSE 210* 130* 80 160* 227*  BUN 57* 79* 85* 71* 114*  CREATININE 11.95* 14.72* 14.32* 11.84* 14.22*  CALCIUM 8.6* 8.6* 8.6* 8.4* 9.2  MG  --  2.0  --   --   --    Liver Function Tests: Recent Labs  Lab 07/31/19 0210 08/01/19 0409 08/01/19 0715 08/02/19 0214 08/03/19 0622  AST 22 27 28  75* 71*  ALT 19 17 17 21 20   ALKPHOS 86 68 69 62 57  BILITOT 0.8 0.7 0.7 0.8 0.8  PROT 7.7 7.3 7.5 7.0 6.6  ALBUMIN 3.6 3.3* 3.3* 3.1* 2.9*   CBC: Recent Labs  Lab 07/30/19 1957 07/31/19 0210 08/01/19 0715 08/02/19 0214 08/02/19 1327 08/03/19 0622  WBC 7.9 6.9 8.7 7.0  --  7.2  NEUTROABS 6.2 5.8 6.8 4.9  --  5.1  HGB 10.8* 11.1* 10.8* 10.2* 10.8* 10.1*  HCT 34.3* 35.7* 34.7* 32.9*  --  33.4*  MCV 87.7 87.9 88.3 88.4  --  88.8  PLT 160 146* 149* 126*  --  145*   BNP (last 3 results) Recent Labs    12/31/18  0818 07/30/19 1957  BNP 528.0* 451.0*     CBG: Recent Labs  Lab 08/02/19 0739 08/02/19 1206 08/02/19 1632 08/02/19 2215 08/03/19 0800  GLUCAP 165* 297* 279* 227* 194*    Recent Results (from the past 240 hour(s))  Culture, blood (Routine X 2) w Reflex to ID Panel     Status: None (Preliminary result)   Collection Time: 07/31/19 12:27 AM   Specimen: BLOOD  Result Value Ref Range Status   Specimen Description BLOOD RIGHT WRIST  Final   Special Requests   Final    BOTTLES DRAWN AEROBIC AND ANAEROBIC Blood Culture adequate volume   Culture   Final    NO GROWTH 3 DAYS Performed at [facility], [address] [place], [place] [address]    Report Status PENDING  Incomplete  Culture, blood (Routine X 2) w Reflex to ID Panel     Status: None (Preliminary  result)   Collection Time: 07/31/19 12:44 AM   Specimen: BLOOD  Result Value Ref Range Status   Specimen Description BLOOD BLOOD RIGHT WRIST  Final   Special Requests   Final    BOTTLES DRAWN AEROBIC AND ANAEROBIC Blood Culture adequate volume   Culture   Final    NO GROWTH 3 DAYS Performed at [facility], [address] [place], [place] [address]    Report Status PENDING  Incomplete  MRSA PCR Screening     Status: None   Collection Time: 08/01/19 11:12 PM   Specimen: Nasopharyngeal  Result Value Ref Range Status   MRSA by PCR NEGATIVE NEGATIVE Final    Comment:        The GeneXpert MRSA Assay (FDA approved for NASAL specimens only), is one component of a comprehensive MRSA colonization surveillance program. It is not intended to diagnose MRSA infection nor to guide or monitor treatment for MRSA infections. Performed at [facility], [address] [place], [place] [address]     Scheduled Meds: . vitamin C  500 mg Oral Daily  . atorvastatin  80 mg Oral QPM  . calcium acetate  2,001 mg Oral TID WC  . cholecalciferol  1,000 Units Oral Daily  . dexamethasone (DECADRON) injection  6 mg Intravenous Q24H  . furosemide  60 mg Intravenous Once  . guaiFENesin  600 mg Oral BID  . insulin aspart  0-5 Units Subcutaneous QHS  . insulin aspart  0-6 Units Subcutaneous TID WC  . insulin aspart  3 Units Subcutaneous TID WC  . insulin detemir  8 Units Subcutaneous BID  . Ipratropium-Albuterol  1 puff Inhalation Q6H  . levETIRAcetam  1,000 mg Oral Daily  . levETIRAcetam  250 mg Oral Once per day on Mon Wed Fri  . [START ON 08/04/2019] metoprolol succinate  50 mg Oral Daily  . pantoprazole (PROTONIX) IV  40 mg Intravenous Q24H  . zinc sulfate  220 mg Oral Daily   Continuous Infusions: . sodium chloride    . sodium chloride    . azithromycin (ZITHROMAX) 500 MG IVPB (Vial-Mate [employer]or) Stopped (08/02/19 2013)  . cefTRIAXone (ROCEPHIN)  IV Stopped (08/02/19 1550)   . remdesivir 100 mg in NS 100 mL Stopped (08/02/19 1511)    Assessment/Plan:  1. Acute on chronic hypoxic respiratory failure secondary to fluid overload from missing dialysis and COVID-19 pneumonia.  Oxygen requirements went up to 3 L nasal cannula. 2. Acute diastolic congestive heart failure and fluid overload. Patient receiving third dialysis session of this hospitalization today. 3. Multifocal  pneumonia secondary to Covid 19 with fever.  Remdesivir started 07/31/2019.  Day 4 of remdesivir today.  Continue steroids, vitamin C and zinc.  Combivent inhaler.  Continue on Rocephin and Zithromax. 4. Acute metabolic encephalopathy.  Likely from Covid infection.  Gabapentin discontinued.  Continue to monitor. Spoke with fianc on the phone and he spoke with her last night and she was answering questions for him.  CT scan of the head was negative.  Family mention when they take off too much fluid she can get confused like this. 5. Diarrhea.  Likely secondary to Covid infection.  As needed Imodium.  Rectal tube placed.  Stool guaiac positive.  Hemoglobin stable.  I did stop aspirin and Plavix for right now just in case there is any bleeding. 6. Hyperkalemia.  Dialysis to manage this. 7. Type 2 diabetes mellitus with end-stage renal disease.  Hemoglobin A1c elevated 8.3.  Continue Levemir and short acting insulin.  Continue sliding scale. 8. Hyperlipidemia unspecified on atorvastatin 9. Seizure history on Keppra 10. Essential hypertension.  With blood pressure on the lower side cut back Toprol dose and discontinue Norvasc and torsemide. 11. Weakness.  Physical therapy evaluation  Code Status:     Code Status Orders  (From admission, onward)         Start     Ordered   07/30/19 2218  Full code  Continuous     07/30/19 2224        Code Status History    Date Active Date Inactive Code Status Order ID Comments User Context   02/13/2019 1329 02/16/2019 1849 Full Code [id number]  Lang Snow, NP ED   12/31/2018 1159 01/02/2019 1330 Full Code [id number]  Lang Snow, NP ED   05/29/2018 1541 05/30/2018 2041 Full Code [id number]  Loletha Grayer, MD ED   03/14/2018 0141 03/15/2018 0336 Full Code [id number]  Amelia Jo, MD Inpatient   03/08/2018 0005 03/09/2018 2108 Full Code [id number]  Vaughan Basta, MD Inpatient   11/15/2017 0017 11/17/2017 1809 Full Code [id number]  Lance Coon, MD ED   11/12/2017 2219 11/13/2017 1935 Full Code [id number]  Lance Coon, MD ED   04/14/2016 1330 04/14/2016 1700 Full Code [id number]  Bettey Costa, MD ED   08/08/2015 2257 08/09/2015 2054 Full Code [id number]  Lavetta Nielsen, Aaron Mose, MD ED   04/17/2015 1334 04/17/2015 1836 Full Code [id number]  Dew, Erskine Squibb, MD Inpatient   Advance Care Planning Activity     Disposition Plan: To be determined based on clinical course.  Consultants:  Nephrology  Antibiotics: -Rocephin and Zithromax -Remdesivir  Time spent: 27 minutes.  Spoke with patient's fianc today  [place]  [facility]

## 2019-08-03 NOTE — Progress Notes (Signed)
[facility]  ROUNDING NOTE   Subjective:   Slow to respond to questions.   Seen and examined on hemodialysis treatment. Some issues with arterial pressures.     HEMODIALYSIS FLOWSHEET:  Blood Flow Rate (mL/min): 400 mL/min Arterial Pressure (mmHg): -180 mmHg Venous Pressure (mmHg): 160 mmHg Transmembrane Pressure (mmHg): 50 mmHg Ultrafiltration Rate (mL/min): 0 mL/min Dialysate Flow Rate (mL/min): 600 ml/min Conductivity: Machine : 14 Conductivity: Machine : 14 Dialysis Fluid Bolus: Normal Saline Bolus Amount (mL): 250 mL    Objective:  Vital signs in last 24 hours:  Temp:  [97.5 F (36.4 C)-98.8 F (37.1 C)] 98.2 F (36.8 C) (01/09 1400) Pulse Rate:  [63-84] 72 (01/09 1400) Resp:  [17-18] 18 (01/09 1400) BP: (75-138)/(48-67) 138/58 (01/09 1400) SpO2:  [88 %-97 %] 94 % (01/09 1400) Weight:  [105.7 kg-133.5 kg] 133.5 kg (01/09 0501)  Weight change: -0.6 kg Filed Weights   08/01/19 1430 08/03/19 0037 08/03/19 0501  Weight: 105.7 kg 105.7 kg 133.5 kg    Intake/Output: I/O last 3 completed shifts: In: 1021.9 [IV Piggyback:1021.9] Out: -    Intake/Output this shift:  Total I/O In: 101.2 [I.V.:0.7; IV Piggyback:100.5] Out: 258 [Other:258]  Physical Exam: General: NAD,   Head: Normocephalic, atraumatic. Moist oral mucosal membranes  Eyes: Anicteric, PERRL  Neck: Supple, trachea midline  Lungs:  Clear to auscultation  Heart: Regular rate and rhythm  Abdomen:  Soft, nontender,   Extremities:  no peripheral edema.  Neurologic: Nonfocal, moving all four extremities  Skin: No lesions  Access: Left AVG    Basic Metabolic Panel: Recent Labs  Lab 07/31/19 0210 08/01/19 0409 08/01/19 0715 08/02/19 0214 08/03/19 0622  NA 138 144 143 140 142  K 5.6* 6.2* 5.9* 5.7* 6.0*  CL 97* 101 101 98 98  CO2 25 20* 20* 22 19*  GLUCOSE 210* 130* 80 160* 227*  BUN 57* 79* 85* 71* 114*  CREATININE 11.95* 14.72* 14.32* 11.84* 14.22*  CALCIUM 8.6* 8.6* 8.6*  8.4* 9.2  MG  --  2.0  --   --   --     Liver Function Tests: Recent Labs  Lab 07/31/19 0210 08/01/19 0409 08/01/19 0715 08/02/19 0214 08/03/19 0622  AST 22 27 28  75* 71*  ALT 19 17 17 21 20   ALKPHOS 86 68 69 62 57  BILITOT 0.8 0.7 0.7 0.8 0.8  PROT 7.7 7.3 7.5 7.0 6.6  ALBUMIN 3.6 3.3* 3.3* 3.1* 2.9*   No results for input(s): LIPASE, AMYLASE in the last 168 hours. Recent Labs  Lab 08/01/19 0409  AMMONIA 14    CBC: Recent Labs  Lab 07/30/19 1957 07/31/19 0210 08/01/19 0715 08/02/19 0214 08/02/19 1327 08/03/19 0622  WBC 7.9 6.9 8.7 7.0  --  7.2  NEUTROABS 6.2 5.8 6.8 4.9  --  5.1  HGB 10.8* 11.1* 10.8* 10.2* 10.8* 10.1*  HCT 34.3* 35.7* 34.7* 32.9*  --  33.4*  MCV 87.7 87.9 88.3 88.4  --  88.8  PLT 160 146* 149* 126*  --  145*    Cardiac Enzymes: No results for input(s): CKTOTAL, CKMB, CKMBINDEX, TROPONINI in the last 168 hours.  BNP: Invalid input(s): POCBNP  CBG: Recent Labs  Lab 08/02/19 0739 08/02/19 1206 08/02/19 1632 08/02/19 2215 08/03/19 0800  GLUCAP 165* 297* 279* 227* 194*    Microbiology: Results for orders placed or performed during the hospital encounter of 07/30/19  Culture, blood (Routine X 2) w Reflex to ID Panel     Status: None (Preliminary  result)   Collection Time: 07/31/19 12:27 AM   Specimen: BLOOD  Result Value Ref Range Status   Specimen Description BLOOD RIGHT WRIST  Final   Special Requests   Final    BOTTLES DRAWN AEROBIC AND ANAEROBIC Blood Culture adequate volume   Culture   Final    NO GROWTH 3 DAYS Performed at [facility], [address], [place], [place] [address]    Report Status PENDING  Incomplete  Culture, blood (Routine X 2) w Reflex to ID Panel     Status: None (Preliminary result)   Collection Time: 07/31/19 12:44 AM   Specimen: BLOOD  Result Value Ref Range Status   Specimen Description BLOOD BLOOD RIGHT WRIST  Final   Special Requests   Final    BOTTLES DRAWN AEROBIC AND ANAEROBIC  Blood Culture adequate volume   Culture   Final    NO GROWTH 3 DAYS Performed at [facility], [address] [place], [place] [address]    Report Status PENDING  Incomplete  MRSA PCR Screening     Status: None   Collection Time: 08/01/19 11:12 PM   Specimen: Nasopharyngeal  Result Value Ref Range Status   MRSA by PCR NEGATIVE NEGATIVE Final    Comment:        The GeneXpert MRSA Assay (FDA approved for NASAL specimens only), is one component of a comprehensive MRSA colonization surveillance program. It is not intended to diagnose MRSA infection nor to guide or monitor treatment for MRSA infections. Performed at [facility], [address] [place], [place] [address]     Coagulation Studies: No results for input(s): LABPROT, INR in the last 72 hours.  Urinalysis: No results for input(s): COLORURINE, LABSPEC, PHURINE, GLUCOSEU, HGBUR, BILIRUBINUR, KETONESUR, PROTEINUR, UROBILINOGEN, NITRITE, LEUKOCYTESUR in the last 72 hours.  Invalid input(s): APPERANCEUR    Imaging: CT HEAD WO CONTRAST  Result Date: 08/02/2019 CLINICAL DATA:  Altered mental status EXAM: CT HEAD WITHOUT CONTRAST TECHNIQUE: Contiguous axial images were obtained from the base of the skull through the vertex without intravenous contrast. COMPARISON:  10/03/2018 FINDINGS: Brain: There is no acute intracranial hemorrhage, mass-effect, or edema. There is no new loss of gray-white differentiation. Chronic bilateral frontal, left posterior temporal, left basal ganglia, and bilateral cerebellar infarcts are again identified. Additional patchy and confluent areas of hypoattenuation in the supratentorial white matter likely reflects stable chronic microvascular ischemic changes. There is no extra-axial fluid collection. Ventricles and sulci are stable in size and configuration. Vascular: There is atherosclerotic calcification at the skull base. Skull: Calvarium is unremarkable. Sinuses/Orbits: No acute  finding. Other: None. IMPRESSION: No acute intracranial abnormality. Stable chronic/nonemergent findings detailed above. Electronically Signed   By: Macy Mis M.D.   On: 08/02/2019 18:04     Medications:   . sodium chloride    . sodium chloride    . sodium chloride Stopped (08/03/19 1435)  . azithromycin (ZITHROMAX) 500 MG IVPB (Vial-Mate [employer]or) 250 mL/hr at 08/03/19 1500  . cefTRIAXone (ROCEPHIN)  IV 2 g (08/03/19 1547)  . remdesivir 100 mg in NS 100 mL Stopped (08/02/19 1511)   . vitamin C  500 mg Oral Daily  . atorvastatin  80 mg Oral QPM  . calcium acetate  2,001 mg Oral TID WC  . cholecalciferol  1,000 Units Oral Daily  . dexamethasone (DECADRON) injection  6 mg Intravenous Q24H  . furosemide  60 mg Intravenous Once  . guaiFENesin  600 mg Oral BID  . insulin aspart  0-5 Units Subcutaneous QHS  . insulin aspart  0-6 Units Subcutaneous TID WC  . insulin aspart  3 Units Subcutaneous TID WC  . insulin detemir  8 Units Subcutaneous BID  . Ipratropium-Albuterol  1 puff Inhalation Q6H  . levETIRAcetam  1,000 mg Oral Daily  . levETIRAcetam  250 mg Oral Once per day on Mon Wed Fri  . [START ON 08/04/2019] metoprolol succinate  50 mg Oral Daily  . pantoprazole (PROTONIX) IV  40 mg Intravenous Q24H  . zinc sulfate  220 mg Oral Daily   sodium chloride, sodium chloride, sodium chloride, acetaminophen, guaiFENesin-dextromethorphan, heparin, heparin, lidocaine-prilocaine, loperamide, magnesium hydroxide, ondansetron **OR** ondansetron (ZOFRAN) IV, pentafluoroprop-tetrafluoroeth, traZODone  Assessment/ Plan:  69 y.o. 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  ESRD on HD MWF but temporarily on TTS.  Patient likely to be transitioning to cohort dialysis unit.  Therefore she will need TTS dialysis seat.   Seen and examined on hemodialysis treatment.   2.  Anemia of chronic kidney disease.  Hemoglobin 10.1 Off EPO currently  3.  Secondary hyperparathyroidism: - calcium acetate with meals.   4. Hypertension: 138/58  - metoprolol dose decreased - holding torsemide, losartan and amlodipine.    LOS: 4 Fianna Snowball 1/9/20214:04 PM

## 2019-08-03 NOTE — Progress Notes (Signed)
PT Cancellation Note  Patient Details Name: Cynthia Dean MRN: [id number] DOB: 04-17-1951   Cancelled Treatment:    Reason Eval/Treat Not Completed: Medical issues which prohibited therapy(Per chart review, patient continues to present with elevated potassium levels (6.0) outside range for safe exertional activity. Will continue to follow and initiate as appropriate.)  Janna Arch, PT, DPT   08/03/2019, 8:43 AM

## 2019-08-03 NOTE — Progress Notes (Signed)
   08/03/19 1345  Neurological  Level of Consciousness Alert  Orientation Level Oriented to person  Respiratory  Respiratory Pattern Regular;Unlabored  Bilateral Breath Sounds Clear;Diminished  Cardiac  Pulse Regular  Heart Sounds S1, S2  ECG Monitor Yes  STABLE FOR D/C TOLERATED HD TX WELL BP DROPPED RECOVERED WITH NS INFUSION AVG +/+ NO C/OS

## 2019-08-03 NOTE — Progress Notes (Signed)
   08/03/19 1045  Neurological  Level of Consciousness Alert  Orientation Level Oriented X4  Respiratory  Respiratory Pattern Regular;Unlabored  Bilateral Breath Sounds Clear;Diminished  Cardiac  Pulse Regular  Heart Sounds S1, S2  ECG Monitor Yes  PT STABLE FOR HD TX VITALS STABLE AVG+/+

## 2019-08-04 ENCOUNTER — Inpatient Hospital Stay: Payer: Medicare HMO

## 2019-08-04 LAB — CBC WITH DIFFERENTIAL/PLATELET
Abs Immature Granulocytes: 0.03 10*3/uL (ref 0.00–0.07)
Basophils Absolute: 0 10*3/uL (ref 0.0–0.1)
Basophils Relative: 0 %
Eosinophils Absolute: 0 10*3/uL (ref 0.0–0.5)
Eosinophils Relative: 0 %
HCT: 29.6 % — ABNORMAL LOW (ref 36.0–46.0)
Hemoglobin: 9.2 g/dL — ABNORMAL LOW (ref 12.0–15.0)
Immature Granulocytes: 0 %
Lymphocytes Relative: 17 %
Lymphs Abs: 1.1 10*3/uL (ref 0.7–4.0)
MCH: 27.3 pg (ref 26.0–34.0)
MCHC: 31.1 g/dL (ref 30.0–36.0)
MCV: 87.8 fL (ref 80.0–100.0)
Monocytes Absolute: 0.5 10*3/uL (ref 0.1–1.0)
Monocytes Relative: 8 %
Neutro Abs: 5 10*3/uL (ref 1.7–7.7)
Neutrophils Relative %: 75 %
Platelets: 165 10*3/uL (ref 150–400)
RBC: 3.37 MIL/uL — ABNORMAL LOW (ref 3.87–5.11)
RDW: 14.7 % (ref 11.5–15.5)
WBC: 6.7 10*3/uL (ref 4.0–10.5)
nRBC: 0 % (ref 0.0–0.2)

## 2019-08-04 LAB — CUP PACEART REMOTE DEVICE CHECK
Date Time Interrogation Session: 20210109185331
Implantable Pulse Generator Implant Date: 20190425

## 2019-08-04 LAB — COMPREHENSIVE METABOLIC PANEL
ALT: 20 U/L (ref 0–44)
AST: 53 U/L — ABNORMAL HIGH (ref 15–41)
Albumin: 2.8 g/dL — ABNORMAL LOW (ref 3.5–5.0)
Alkaline Phosphatase: 56 U/L (ref 38–126)
Anion gap: 23 — ABNORMAL HIGH (ref 5–15)
BUN: 87 mg/dL — ABNORMAL HIGH (ref 8–23)
CO2: 20 mmol/L — ABNORMAL LOW (ref 22–32)
Calcium: 8.7 mg/dL — ABNORMAL LOW (ref 8.9–10.3)
Chloride: 97 mmol/L — ABNORMAL LOW (ref 98–111)
Creatinine, Ser: 11.11 mg/dL — ABNORMAL HIGH (ref 0.44–1.00)
GFR calc Af Amer: 4 mL/min — ABNORMAL LOW (ref >60)
GFR calc non Af Amer: 3 mL/min — ABNORMAL LOW (ref >60)
Glucose, Bld: 255 mg/dL — ABNORMAL HIGH (ref 70–99)
Potassium: 5.2 mmol/L — ABNORMAL HIGH (ref 3.5–5.1)
Sodium: 140 mmol/L (ref 135–145)
Total Bilirubin: 0.8 mg/dL (ref 0.3–1.2)
Total Protein: 6.5 g/dL (ref 6.5–8.1)

## 2019-08-04 LAB — GLUCOSE, CAPILLARY
Glucose-Capillary: 266 mg/dL — ABNORMAL HIGH (ref 70–99)
Glucose-Capillary: 328 mg/dL — ABNORMAL HIGH (ref 70–99)
Glucose-Capillary: 229 mg/dL — ABNORMAL HIGH (ref 70–99)
Glucose-Capillary: 244 mg/dL — ABNORMAL HIGH (ref 70–99)

## 2019-08-04 LAB — C-REACTIVE PROTEIN: CRP: 5.1 mg/dL — ABNORMAL HIGH (ref <1.0)

## 2019-08-04 LAB — FIBRIN DERIVATIVES D-DIMER (ARMC ONLY): Fibrin derivatives D-dimer (ARMC): 2126.5 ng/mL (FEU) — ABNORMAL HIGH (ref 0.00–499.00)

## 2019-08-04 LAB — FERRITIN: Ferritin: 3784 ng/mL — ABNORMAL HIGH (ref 11–307)

## 2019-08-04 MED ORDER — DEXAMETHASONE 4 MG PO TABS
4.0000 mg | ORAL_TABLET | Freq: Every day | ORAL | Status: DC
  Administered 2019-08-05: 4 mg via ORAL
  Filled 2019-08-04 (×2): qty 1

## 2019-08-04 MED ORDER — EPOETIN ALFA 10000 UNIT/ML IJ SOLN
10000.0000 [IU] | INTRAMUSCULAR | Status: DC
  Administered 2019-08-10: 10000 [IU] via INTRAVENOUS
  Filled 2019-08-04: qty 1

## 2019-08-04 MED ORDER — LOPERAMIDE HCL 2 MG PO CAPS
4.0000 mg | ORAL_CAPSULE | Freq: Three times a day (TID) | ORAL | Status: DC | PRN
  Administered 2019-08-04 – 2019-08-05 (×2): 4 mg via ORAL
  Filled 2019-08-04 (×2): qty 2

## 2019-08-04 NOTE — Progress Notes (Signed)
PT Cancellation Note  Patient Details Name: Maecie Sevcik MRN: [id number] DOB: 02-12-1951   Cancelled Treatment:    Reason Eval/Treat Not Completed: Patient at procedure or test/unavailable- Spoke with RN who states patient is more appropriate for PT, however she is headed down to MRI due to confusion. Will follow up and attempt evaluation tomorrow.    Sennie Borden 08/04/2019, 4:42 PM

## 2019-08-04 NOTE — Progress Notes (Signed)
[facility]  ROUNDING NOTE   Subjective:   Hemodialysis treatment yesterday. Tolerated treatment well. UF even. Some elevated arterial pressures.   Confused. Pulled her rectal tube this morning. Continues to have diarrhea.   Objective:  Vital signs in last 24 hours:  Temp:  [97.7 F (36.5 C)-99 F (37.2 C)] 98.1 F (36.7 C) (01/10 1258) Pulse Rate:  [69-73] 69 (01/10 1258) Resp:  [18-20] 18 (01/10 1258) BP: (124-146)/(48-67) 146/57 (01/10 1258) SpO2:  [90 %-94 %] 92 % (01/10 1258)  Weight change:  Filed Weights   08/01/19 1430 08/03/19 0037 08/03/19 0501  Weight: 105.7 kg 105.7 kg 133.5 kg    Intake/Output: I/O last 3 completed shifts: In: 351.2 [I.V.:0.7; IV Piggyback:350.5] Out: 258 [Other:258]   Intake/Output this shift:  No intake/output data recorded.  Physical Exam: General: NAD,   Head: Normocephalic, atraumatic. Moist oral mucosal membranes  Eyes: Anicteric, PERRL  Neck: Supple, trachea midline  Lungs:  Clear to auscultation  Heart: Regular rate and rhythm  Abdomen:  Soft, nontender,   Extremities:  no peripheral edema.  Neurologic: Nonfocal, moving all four extremities  Skin: No lesions  Access: Left AVG    Basic Metabolic Panel: Recent Labs  Lab 08/01/19 0409 08/01/19 0715 08/02/19 0214 08/03/19 0622 08/04/19 0517  NA 144 143 140 142 140  K 6.2* 5.9* 5.7* 6.0* 5.2*  CL 101 101 98 98 97*  CO2 20* 20* 22 19* 20*  GLUCOSE 130* 80 160* 227* 255*  BUN 79* 85* 71* 114* 87*  CREATININE 14.72* 14.32* 11.84* 14.22* 11.11*  CALCIUM 8.6* 8.6* 8.4* 9.2 8.7*  MG 2.0  --   --   --   --     Liver Function Tests: Recent Labs  Lab 08/01/19 0409 08/01/19 0715 08/02/19 0214 08/03/19 0622 08/04/19 0517  AST 27 28 75* 71* 53*  ALT 17 17 21 20 20   ALKPHOS 68 69 62 57 56  BILITOT 0.7 0.7 0.8 0.8 0.8  PROT 7.3 7.5 7.0 6.6 6.5  ALBUMIN 3.3* 3.3* 3.1* 2.9* 2.8*   No results for input(s): LIPASE, AMYLASE in the last 168 hours. Recent  Labs  Lab 08/01/19 0409  AMMONIA 14    CBC: Recent Labs  Lab 07/31/19 0210 08/01/19 0715 08/02/19 0214 08/02/19 1327 08/03/19 0622 08/04/19 0517  WBC 6.9 8.7 7.0  --  7.2 6.7  NEUTROABS 5.8 6.8 4.9  --  5.1 5.0  HGB 11.1* 10.8* 10.2* 10.8* 10.1* 9.2*  HCT 35.7* 34.7* 32.9*  --  33.4* 29.6*  MCV 87.9 88.3 88.4  --  88.8 87.8  PLT 146* 149* 126*  --  145* 165    Cardiac Enzymes: No results for input(s): CKTOTAL, CKMB, CKMBINDEX, TROPONINI in the last 168 hours.  BNP: Invalid input(s): POCBNP  CBG: Recent Labs  Lab 08/03/19 0800 08/03/19 1628 08/03/19 2111 08/04/19 0810 08/04/19 1137  GLUCAP 194* 330* 226* 266* 328*    Microbiology: Results for orders placed or performed during the hospital encounter of 07/30/19  Culture, blood (Routine X 2) w Reflex to ID Panel     Status: None (Preliminary result)   Collection Time: 07/31/19 12:27 AM   Specimen: BLOOD  Result Value Ref Range Status   Specimen Description BLOOD RIGHT WRIST  Final   Special Requests   Final    BOTTLES DRAWN AEROBIC AND ANAEROBIC Blood Culture adequate volume   Culture   Final    NO GROWTH 4 DAYS Performed at [facility], [place]  [address] [place], [place] [address]    Report Status PENDING  Incomplete  Culture, blood (Routine X 2) w Reflex to ID Panel     Status: None (Preliminary result)   Collection Time: 07/31/19 12:44 AM   Specimen: BLOOD  Result Value Ref Range Status   Specimen Description BLOOD BLOOD RIGHT WRIST  Final   Special Requests   Final    BOTTLES DRAWN AEROBIC AND ANAEROBIC Blood Culture adequate volume   Culture   Final    NO GROWTH 4 DAYS Performed at [facility], [address] [place], [place] [address]    Report Status PENDING  Incomplete  MRSA PCR Screening     Status: None   Collection Time: 08/01/19 11:12 PM   Specimen: Nasopharyngeal  Result Value Ref Range Status   MRSA by PCR NEGATIVE NEGATIVE Final    Comment:        The GeneXpert  MRSA Assay (FDA approved for NASAL specimens only), is one component of a comprehensive MRSA colonization surveillance program. It is not intended to diagnose MRSA infection nor to guide or monitor treatment for MRSA infections. Performed at [facility], [address] [place], [place] [address]     Coagulation Studies: No results for input(s): LABPROT, INR in the last 72 hours.  Urinalysis: No results for input(s): COLORURINE, LABSPEC, PHURINE, GLUCOSEU, HGBUR, BILIRUBINUR, KETONESUR, PROTEINUR, UROBILINOGEN, NITRITE, LEUKOCYTESUR in the last 72 hours.  Invalid input(s): APPERANCEUR    Imaging: CT HEAD WO CONTRAST  Result Date: 08/02/2019 CLINICAL DATA:  Altered mental status EXAM: CT HEAD WITHOUT CONTRAST TECHNIQUE: Contiguous axial images were obtained from the base of the skull through the vertex without intravenous contrast. COMPARISON:  10/03/2018 FINDINGS: Brain: There is no acute intracranial hemorrhage, mass-effect, or edema. There is no new loss of gray-white differentiation. Chronic bilateral frontal, left posterior temporal, left basal ganglia, and bilateral cerebellar infarcts are again identified. Additional patchy and confluent areas of hypoattenuation in the supratentorial white matter likely reflects stable chronic microvascular ischemic changes. There is no extra-axial fluid collection. Ventricles and sulci are stable in size and configuration. Vascular: There is atherosclerotic calcification at the skull base. Skull: Calvarium is unremarkable. Sinuses/Orbits: No acute finding. Other: None. IMPRESSION: No acute intracranial abnormality. Stable chronic/nonemergent findings detailed above. Electronically Signed   By: Macy Mis M.D.   On: 08/02/2019 18:04   CUP PACEART REMOTE DEVICE CHECK  Result Date: 08/04/2019 Carelink summary report received. Battery status OK. Normal device function. No new symptom episodes, tachy episodes, brady, or pause episodes. No  new AF episodes. Monthly summary reports and ROV/PRN. SChancey    Medications:   . sodium chloride    . sodium chloride    . sodium chloride 30 mL (08/04/19 0833)  . azithromycin (ZITHROMAX) 500 MG IVPB (Vial-Mate Adaptor) 500 mg (08/04/19 0835)  . cefTRIAXone (ROCEPHIN)  IV 2 g (08/04/19 1021)   . vitamin C  500 mg Oral Daily  . atorvastatin  80 mg Oral QPM  . calcium acetate  2,001 mg Oral TID WC  . cholecalciferol  1,000 Units Oral Daily  . dexamethasone (DECADRON) injection  6 mg Intravenous Q24H  . furosemide  60 mg Intravenous Once  . guaiFENesin  600 mg Oral BID  . insulin aspart  0-5 Units Subcutaneous QHS  . insulin aspart  0-6 Units Subcutaneous TID WC  . insulin aspart  3 Units Subcutaneous TID WC  . insulin detemir  8 Units Subcutaneous BID  . Ipratropium-Albuterol  1 puff Inhalation Q6H  . levETIRAcetam  1,000 mg Oral Daily  . levETIRAcetam  250 mg Oral Once per day on Mon Wed Fri  . metoprolol succinate  50 mg Oral Daily  . pantoprazole (PROTONIX) IV  40 mg Intravenous Q24H  . zinc sulfate  220 mg Oral Daily   sodium chloride, sodium chloride, sodium chloride, acetaminophen, guaiFENesin-dextromethorphan, heparin, heparin, lidocaine-prilocaine, loperamide, magnesium hydroxide, ondansetron **OR** ondansetron (ZOFRAN) IV, pentafluoroprop-tetrafluoroeth, traZODone  Assessment/ Plan:  69 y.o. 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  ESRD on HD MWF but temporarily on TTS.  Patient likely to be transitioning to cohort dialysis unit.  Therefore she will need TTS dialysis seat.     2.  Anemia of chronic kidney disease. Hemoglobin 9.2 Off EPO currently. Will resume on next HD treatment.   3.  Secondary hyperparathyroidism: - calcium acetate with meals.   4. Hypertension:   - metoprolol dose decreased - holding torsemide, losartan and amlodipine.    LOS: 5 Cynthia Dean 1/10/20212:44 PM

## 2019-08-04 NOTE — Progress Notes (Signed)
Patient ID: Cynthia Dean, female   DOB: Dec 23, 1950, 69 y.o.   MRN: [id number] [facility] PROGRESS NOTE  Cynthia Dean LEX:[id number] DOB: 08-06-1950 DOA: 07/30/2019 PCP: Lowella Bandy, Cynthia Dean  HPI/Subjective: Patient seen this morning.  She pulled out her rectal tube.  After that had an episode of diarrhea.  The patient did not want the rectal tube replaced.  No complaints of shortness of breath.  Does have a little abdominal pain.  Objective: Vitals:   08/04/19 1056 08/04/19 1258  BP: (!) 128/48 (!) 146/57  Pulse: 72 69  Resp:  18  Temp:  98.1 F (36.7 C)  SpO2:  92%    Filed Weights   08/01/19 1430 08/03/19 0037 08/03/19 0501  Weight: 105.7 kg 105.7 kg 133.5 kg    ROS: Review of Systems  Unable to perform ROS: Acuity of condition  Respiratory: Negative for shortness of breath.   Cardiovascular: Negative for chest pain.  Gastrointestinal: Negative for abdominal pain.   Exam: Physical Exam  HENT:  Nose: No mucosal edema.  Mouth/Throat: No oropharyngeal exudate or posterior oropharyngeal edema.  Eyes: Conjunctivae and lids are normal.  Neck: Carotid bruit is not present.  Cardiovascular: S1 normal and S2 normal. Exam reveals no gallop.  Murmur heard.  Systolic murmur is present with a grade of 2/6. Respiratory: No respiratory distress. She has decreased breath sounds in the right lower field and the left lower field. She has no wheezes. She has rhonchi in the right lower field and the left lower field. She has no rales.  GI: Soft. Bowel sounds are normal. There is no abdominal tenderness.  Musculoskeletal:     Right ankle: Swelling present.     Left ankle: Swelling present.  Lymphadenopathy:    She has no cervical adenopathy.  Neurological: She is alert.  Able to follow some simple commands.  Slow with her answers  Skin: Skin is warm. No rash noted. Nails show no clubbing.  Psychiatric:  Patient still slow with answers      Data  Reviewed: Basic Metabolic Panel: Recent Labs  Lab 08/01/19 0409 08/01/19 0715 08/02/19 0214 08/03/19 0622 08/04/19 0517  NA 144 143 140 142 140  K 6.2* 5.9* 5.7* 6.0* 5.2*  CL 101 101 98 98 97*  CO2 20* 20* 22 19* 20*  GLUCOSE 130* 80 160* 227* 255*  BUN 79* 85* 71* 114* 87*  CREATININE 14.72* 14.32* 11.84* 14.22* 11.11*  CALCIUM 8.6* 8.6* 8.4* 9.2 8.7*  MG 2.0  --   --   --   --    Liver Function Tests: Recent Labs  Lab 08/01/19 0409 08/01/19 0715 08/02/19 0214 08/03/19 0622 08/04/19 0517  AST 27 28 75* 71* 53*  ALT 17 17 21 20 20   ALKPHOS 68 69 62 57 56  BILITOT 0.7 0.7 0.8 0.8 0.8  PROT 7.3 7.5 7.0 6.6 6.5  ALBUMIN 3.3* 3.3* 3.1* 2.9* 2.8*   CBC: Recent Labs  Lab 07/31/19 0210 08/01/19 0715 08/02/19 0214 08/02/19 1327 08/03/19 0622 08/04/19 0517  WBC 6.9 8.7 7.0  --  7.2 6.7  NEUTROABS 5.8 6.8 4.9  --  5.1 5.0  HGB 11.1* 10.8* 10.2* 10.8* 10.1* 9.2*  HCT 35.7* 34.7* 32.9*  --  33.4* 29.6*  MCV 87.9 88.3 88.4  --  88.8 87.8  PLT 146* 149* 126*  --  145* 165   BNP (last 3 results) Recent Labs    12/31/18 0818 07/30/19 1957  BNP 528.0* 451.0*  CBG: Recent Labs  Lab 08/03/19 0800 08/03/19 1628 08/03/19 2111 08/04/19 0810 08/04/19 1137  GLUCAP 194* 330* 226* 266* 328*    Recent Results (from the past 240 hour(s))  Culture, blood (Routine X 2) w Reflex to ID Panel     Status: None (Preliminary result)   Collection Time: 07/31/19 12:27 AM   Specimen: BLOOD  Result Value Ref Range Status   Specimen Description BLOOD RIGHT WRIST  Final   Special Requests   Final    BOTTLES DRAWN AEROBIC AND ANAEROBIC Blood Culture adequate volume   Culture   Final    NO GROWTH 4 DAYS Performed at [facility], [address], [place], [place] [address]    Report Status PENDING  Incomplete  Culture, blood (Routine X 2) w Reflex to ID Panel     Status: None (Preliminary result)   Collection Time: 07/31/19 12:44 AM   Specimen: BLOOD  Result  Value Ref Range Status   Specimen Description BLOOD BLOOD RIGHT WRIST  Final   Special Requests   Final    BOTTLES DRAWN AEROBIC AND ANAEROBIC Blood Culture adequate volume   Culture   Final    NO GROWTH 4 DAYS Performed at [facility], [address], [place], [place] [address]    Report Status PENDING  Incomplete  MRSA PCR Screening     Status: None   Collection Time: 08/01/19 11:12 PM   Specimen: Nasopharyngeal  Result Value Ref Range Status   MRSA by PCR NEGATIVE NEGATIVE Final    Comment:        The GeneXpert MRSA Assay (FDA approved for NASAL specimens only), is one component of a comprehensive MRSA colonization surveillance program. It is not intended to diagnose MRSA infection nor to guide or monitor treatment for MRSA infections. Performed at [facility], [address] [place], [place] [address]     Scheduled Meds: . vitamin C  500 mg Oral Daily  . atorvastatin  80 mg Oral QPM  . calcium acetate  2,001 mg Oral TID WC  . cholecalciferol  1,000 Units Oral Daily  . dexamethasone (DECADRON) injection  6 mg Intravenous Q24H  . [START ON 08/06/2019] epoetin (EPOGEN/PROCRIT) injection  10,000 Units Intravenous Q T,Th,Sa-HD  . furosemide  60 mg Intravenous Once  . guaiFENesin  600 mg Oral BID  . insulin aspart  0-5 Units Subcutaneous QHS  . insulin aspart  0-6 Units Subcutaneous TID WC  . insulin aspart  3 Units Subcutaneous TID WC  . insulin detemir  8 Units Subcutaneous BID  . Ipratropium-Albuterol  1 puff Inhalation Q6H  . levETIRAcetam  1,000 mg Oral Daily  . levETIRAcetam  250 mg Oral Once per day on Mon Wed Fri  . metoprolol succinate  50 mg Oral Daily  . pantoprazole (PROTONIX) IV  40 mg Intravenous Q24H  . zinc sulfate  220 mg Oral Daily   Continuous Infusions: . sodium chloride    . sodium chloride    . sodium chloride 30 mL (08/04/19 0833)  . azithromycin (ZITHROMAX) 500 MG IVPB (Vial-Mate [employer]or) 500 mg (08/04/19 0835)  .  cefTRIAXone (ROCEPHIN)  IV 2 g (08/04/19 1021)    Assessment/Plan:  1. Acute on chronic hypoxic respiratory failure secondary to fluid overload from missing dialysis and COVID-19 pneumonia.  Oxygen requirements went up to 3 L nasal cannula. 2. Acute diastolic congestive heart failure and fluid overload. Patient has had 3 dialysis sessions in this hospitalization.  Fluid status seems  improved. 3. Multifocal pneumonia secondary to Covid 19 with fever.  Remdesivir started 07/31/2019.  Day 5 of remdesivir today.  Start to taper steroids.  Continue vitamin C and zinc.  Combivent inhaler.  Will get rid of Zithromax and Rocephin today. 4. Acute metabolic encephalopathy.  Likely from Covid infection.  MRI brain ordered.  Gabapentin discontinued.  Continue to monitor.  Very concerned about her mental status. 5. Diarrhea.  Likely secondary to Covid infection.  As needed Imodium.  Rectal tube pulled out by patient.  Stool guaiac positive.  Hemoglobin stable.  I did stop aspirin and Plavix for right now just in case there is any bleeding. 6. Hyperkalemia.  Dialysis to manage this. 7. Type 2 diabetes mellitus with end-stage renal disease.  Hemoglobin A1c elevated 8.3.  Continue Levemir and short acting insulin.  Continue sliding scale. 8. Hyperlipidemia unspecified on atorvastatin 9. Seizure history on Keppra 10. Essential hypertension.  With blood pressure on the lower side cut back Toprol dose and discontinue Norvasc and torsemide. 11. Weakness.  Physical therapy evaluation  Code Status:     Code Status Orders  (From admission, onward)         Start     Ordered   07/30/19 2218  Full code  Continuous     07/30/19 2224        Code Status History    Date Active Date Inactive Code Status Order ID Comments User Context   02/13/2019 1329 02/16/2019 1849 Full Code [id number]  Lang Snow, NP ED   12/31/2018 1159 01/02/2019 1330 Full Code [id number]  Lang Snow, NP ED   05/29/2018 1541  05/30/2018 2041 Full Code [id number]  Cynthia Grayer, Cynthia Dean ED   03/14/2018 0141 03/15/2018 0336 Full Code [id number]  Amelia Jo, Cynthia Dean Inpatient   03/08/2018 0005 03/09/2018 2108 Full Code [id number]  Vaughan Basta, Cynthia Dean Inpatient   11/15/2017 0017 11/17/2017 1809 Full Code [id number]  Lance Coon, Cynthia Dean ED   11/12/2017 2219 11/13/2017 1935 Full Code [id number]  Lance Coon, Cynthia Dean ED   04/14/2016 1330 04/14/2016 1700 Full Code [id number]  Bettey Costa, Cynthia Dean ED   08/08/2015 2257 08/09/2015 2054 Full Code [id number]  Lavetta Nielsen, Aaron Mose, Cynthia Dean ED   04/17/2015 1334 04/17/2015 1836 Full Code [id number]  Dew, Erskine Squibb, Cynthia Dean Inpatient   Advance Care Planning Activity     Disposition Plan: To be determined based on clinical course.  Consultants:  Nephrology  Antibiotics: -Rocephin and Zithromax (course completed) -Remdesivir (course completed)  Time spent: 27 minutes.  Spoke with patient's daughter today  Cynthia Grayer  [facility]

## 2019-08-04 NOTE — Progress Notes (Signed)
Patient pulled out rectal tube this morning around 1000. Doctor notified and said to keep rectal tube out and keep monitoring patient's bowel movements. Patient had very watery stool at 1030. Gave patient 2 mg of loperamide at that time. Since then patient has had one very small watery BM around 1200.

## 2019-08-05 LAB — CBC
HCT: 29.1 % — ABNORMAL LOW (ref 36.0–46.0)
Hemoglobin: 9.2 g/dL — ABNORMAL LOW (ref 12.0–15.0)
MCH: 27.7 pg (ref 26.0–34.0)
MCHC: 31.6 g/dL (ref 30.0–36.0)
MCV: 87.7 fL (ref 80.0–100.0)
Platelets: 187 10*3/uL (ref 150–400)
RBC: 3.32 MIL/uL — ABNORMAL LOW (ref 3.87–5.11)
RDW: 14.8 % (ref 11.5–15.5)
WBC: 9.2 10*3/uL (ref 4.0–10.5)
nRBC: 0 % (ref 0.0–0.2)

## 2019-08-05 LAB — BASIC METABOLIC PANEL
Anion gap: 21 — ABNORMAL HIGH (ref 5–15)
BUN: 112 mg/dL — ABNORMAL HIGH (ref 8–23)
CO2: 22 mmol/L (ref 22–32)
Calcium: 8.8 mg/dL — ABNORMAL LOW (ref 8.9–10.3)
Chloride: 98 mmol/L (ref 98–111)
Creatinine, Ser: 13.02 mg/dL — ABNORMAL HIGH (ref 0.44–1.00)
GFR calc Af Amer: 3 mL/min — ABNORMAL LOW (ref >60)
GFR calc non Af Amer: 3 mL/min — ABNORMAL LOW (ref >60)
Glucose, Bld: 174 mg/dL — ABNORMAL HIGH (ref 70–99)
Potassium: 5.3 mmol/L — ABNORMAL HIGH (ref 3.5–5.1)
Sodium: 141 mmol/L (ref 135–145)

## 2019-08-05 LAB — GLUCOSE, CAPILLARY
Glucose-Capillary: 179 mg/dL — ABNORMAL HIGH (ref 70–99)
Glucose-Capillary: 181 mg/dL — ABNORMAL HIGH (ref 70–99)
Glucose-Capillary: 248 mg/dL — ABNORMAL HIGH (ref 70–99)
Glucose-Capillary: 292 mg/dL — ABNORMAL HIGH (ref 70–99)

## 2019-08-05 LAB — CULTURE, BLOOD (ROUTINE X 2)
Culture: NO GROWTH
Culture: NO GROWTH
Special Requests: ADEQUATE
Special Requests: ADEQUATE

## 2019-08-05 NOTE — Evaluation (Signed)
Physical Therapy Evaluation Patient Details Name: Verlie Liotta MRN: [id number] DOB: [birth year] Today's Date: 08/05/2019   History of Present Illness  presented to ER secondary to generalized fatigue, weakness, hypoxia; admitted for management of acute respiratory failure related to multifocal PNA due to covid-19 virus.  Of note, patient with persistent hyperkalemia (down to 5.3 today); cleared by Dr. Leslye Peer for participation with therapy as tolerated.  Clinical Impression  Patient sleeping upon arrival to room, but easily arousable and agreeable to session.  Oriented to self, location and general situation; disoriented to date, but indep utilizes white board as compensatory memory strategy.  Easily distracted by external environment, but redirected with min cuing from therapist.  Bilat UE/LE strength and ROM grossly symmetrical and WFL; no focal weakness, sensory or coordination deficit noted.  Currently requiring min assist for bed mobility; min assist for sit/stand, basic transfers and short-distance gait (5') from bed/chair with RW.  Demonstrates broad BOS, decreased step height/length bilat; mod WBing bilat UEs.  Slow and deliberate, but no overt buckling or LOB.  Significant desat noted with minimal exertion (79-80%) on 4L, requiring seated rest x2 min for recovery >88%.  Generally asymptomatic throughout.  Patient left on 4L end of session with sats 87-88%. RN informed/aware Would benefit from skilled PT to address above deficits and promote optimal return to PLOF.; Recommend transition to HHPT upon discharge from acute hospitalization.     Follow Up Recommendations Home health PT    Equipment Recommendations  Rolling walker with 5" wheels;3in1 (PT)    Recommendations for Other Services       Precautions / Restrictions Precautions Precautions: Fall Restrictions Weight Bearing Restrictions: No      Mobility  Bed Mobility Overal bed mobility: Needs Assistance Bed  Mobility: Supine to Sit     Supine to sit: Min assist        Transfers Overall transfer level: Needs assistance Equipment used: Rolling walker (2 wheeled) Transfers: Sit to/from Stand Sit to Stand: Min assist         General transfer comment: increased time, mild use of momentum; does require UE support to complete  Ambulation/Gait Ambulation/Gait assistance: Min assist Gait Distance (Feet): 5 Feet Assistive device: Rolling walker (2 wheeled)       General Gait Details: broad BOS, decreased step height/length bilat; mod WBing bilat UEs.  Slow and deliberate, but no overt buckling or LOB.  Significant desat noted with minimal exertion (79-80%) on 4L, requiring seated rest x2 min for recovery >88%.  Generally asymptomatic throughout  Stairs            Wheelchair Mobility    Modified Rankin (Stroke Patients Only)       Balance Overall balance assessment: Needs assistance Sitting-balance support: No upper extremity supported;Feet supported Sitting balance-Leahy Scale: Good     Standing balance support: Bilateral upper extremity supported Standing balance-Leahy Scale: Fair                               Pertinent Vitals/Pain Pain Assessment: No/denies pain    Home Living Family/patient expects to be discharged to:: Private residence Living Arrangements: Spouse/significant other Available Help at Discharge: Family;Available PRN/intermittently Type of Home: House Home Access: Stairs to enter Entrance Stairs-Rails: Right;Left;Can reach both Entrance Stairs-Number of Steps: 2 Home Layout: One level        Prior Function           Comments: Mod indep with ADLs,  household and limited community distances; utilizes transportation [place] to/from dialysis (able to walk to/from)     [employer]        Extremity/Trunk Assessment   Upper Extremity Assessment Upper Extremity Assessment: (grosly at least 4-/5 throughout)    Lower Extremity  Assessment Lower Extremity Assessment: (grosly at least 4-/5 throughout)       Communication   Communication: (speech clear and understandable; somewhat delayed and effortful at times)  Cognition Arousal/Alertness: Awake/alert Behavior During Therapy: Flat affect Overall Cognitive Status: No family/caregiver present to determine baseline cognitive functioning                                 General Comments: oriented to self, location and general situation; indep utilizes white board in room as compensatory strategy for date.  Follows commands, pleasant and cooperative; easily distractible by external environment      General Comments      Exercises Other Exercises Other Exercises: Educated in pursed lip breathing, postural extension/positioning; initiated instruction in activity pacing, signs/symptoms of need for rest period. Patient voiced undersatnding; will continue to reinforce as needed. Other Exercises: Sit/stand from various surface heights-edge of bed, recliner-with RW, min assist.  Requires use of UEs for lift off and to control descent onto seating surface. Other Exercises: ACtivity progression limited by bowel incontinence during session; dep for hygiene.   Assessment/Plan    PT Assessment Patient needs continued PT services  PT Problem List Decreased strength;Decreased balance;Decreased cognition;Decreased range of motion;Decreased mobility;Decreased knowledge of use of DME;Cardiopulmonary status limiting activity;Decreased knowledge of precautions;Decreased activity tolerance;Decreased coordination;Decreased safety awareness       PT Treatment Interventions DME instruction;Gait training;Stair training;Functional mobility training;Therapeutic activities;Therapeutic exercise;Balance training;Cognitive remediation;Patient/family education    PT Goals (Current goals can be found in the Care Plan section)  Acute Rehab PT Goals Patient Stated Goal: to return  home; agreeable for OOB to chair PT Goal Formulation: With patient Time For Goal Achievement: 08/19/19 Potential to Achieve Goals: Good    Frequency Min 2X/week   Barriers to discharge        Co-evaluation               AM-PAC PT "6 Clicks" Mobility  Outcome Measure Help needed turning from your back to your side while in a flat bed without using bedrails?: A Little Help needed moving from lying on your back to sitting on the side of a flat bed without using bedrails?: A Little Help needed moving to and from a bed to a chair (including a wheelchair)?: A Little Help needed standing up from a chair using your arms (e.g., wheelchair or bedside chair)?: A Little Help needed to walk in hospital room?: A Little Help needed climbing 3-5 steps with a railing? : A Lot 6 Click Score: 17    End of Session Equipment Utilized During Treatment: Oxygen Activity Tolerance: Treatment limited secondary to medical complications (Comment)(desat with minimal exertion) Patient left: in chair;with call bell/phone within reach;with chair alarm set Nurse Communication: Mobility status PT Visit Diagnosis: Muscle weakness (generalized) (M62.81);Difficulty in walking, not elsewhere classified (R26.2)    Time: 2376-2831 PT Time Calculation (min) (ACUTE ONLY): 32 min   Charges:   PT Evaluation $PT Eval Moderate Complexity: 1 Mod PT Treatments $Therapeutic Activity: 8-22 mins        Samanatha Brammer H. Owens Shark, PT, DPT, NCS 08/05/19, 11:40 PM [phone]

## 2019-08-05 NOTE — Progress Notes (Signed)
PT Cancellation Note  Patient Details Name: Jelitza Manninen MRN: [id number] DOB: [birth year]   Cancelled Treatment:    Reason Eval/Treat Not Completed: (Consult received and chart reviewed.  Currently eating lunch. Will re-attempt in PM as patient medically appropriate and available.)   Prabhav Faulkenberry H. Owens Shark, PT, DPT, NCS 08/05/19, 1:53 PM [phone]

## 2019-08-05 NOTE — Progress Notes (Signed)
[facility]  ROUNDING NOTE   Subjective:   Confused this morning.   Objective:  Vital signs in last 24 hours:  Temp:  [97.8 F (36.6 C)-98.6 F (37 C)] 98.6 F (37 C) (01/11 1142) Pulse Rate:  [64-72] 72 (01/11 1142) Resp:  [20] 20 (01/11 1142) BP: (114-152)/(63-66) 114/63 (01/11 1142) SpO2:  [88 %-95 %] 91 % (01/11 1142)  Weight change:  Filed Weights   08/01/19 1430 08/03/19 0037 08/03/19 0501  Weight: 105.7 kg 105.7 kg 133.5 kg    Intake/Output: I/O last 3 completed shifts: In: 907.8 [P.O.:100; I.V.:208.4; IV Piggyback:599.3] Out: -    Intake/Output this shift:  No intake/output data recorded.  Physical Exam: General: NAD,   Head: Normocephalic, atraumatic. Moist oral mucosal membranes  Eyes: Anicteric, PERRL  Neck: Supple, trachea midline  Lungs:  Clear to auscultation  Heart: Regular rate and rhythm  Abdomen:  Soft, nontender,   Extremities:  no peripheral edema.  Neurologic: Nonfocal, moving all four extremities  Skin: No lesions  Access: Left AVG    Basic Metabolic Panel: Recent Labs  Lab 08/01/19 0409 08/01/19 0715 08/02/19 0214 08/03/19 0622 08/04/19 0517 08/05/19 0612  NA 144 143 140 142 140 141  K 6.2* 5.9* 5.7* 6.0* 5.2* 5.3*  CL 101 101 98 98 97* 98  CO2 20* 20* 22 19* 20* 22  GLUCOSE 130* 80 160* 227* 255* 174*  BUN 79* 85* 71* 114* 87* 112*  CREATININE 14.72* 14.32* 11.84* 14.22* 11.11* 13.02*  CALCIUM 8.6* 8.6* 8.4* 9.2 8.7* 8.8*  MG 2.0  --   --   --   --   --     Liver Function Tests: Recent Labs  Lab 08/01/19 0409 08/01/19 0715 08/02/19 0214 08/03/19 0622 08/04/19 0517  AST 27 28 75* 71* 53*  ALT 17 17 21 20 20   ALKPHOS 68 69 62 57 56  BILITOT 0.7 0.7 0.8 0.8 0.8  PROT 7.3 7.5 7.0 6.6 6.5  ALBUMIN 3.3* 3.3* 3.1* 2.9* 2.8*   No results for input(s): LIPASE, AMYLASE in the last 168 hours. Recent Labs  Lab 08/01/19 0409  AMMONIA 14    CBC: Recent Labs  Lab 07/31/19 0210 08/01/19 0715  08/02/19 0214 08/02/19 1327 08/03/19 0622 08/04/19 0517 08/05/19 0612  WBC 6.9 8.7 7.0  --  7.2 6.7 9.2  NEUTROABS 5.8 6.8 4.9  --  5.1 5.0  --   HGB 11.1* 10.8* 10.2* 10.8* 10.1* 9.2* 9.2*  HCT 35.7* 34.7* 32.9*  --  33.4* 29.6* 29.1*  MCV 87.9 88.3 88.4  --  88.8 87.8 87.7  PLT 146* 149* 126*  --  145* 165 187    Cardiac Enzymes: No results for input(s): CKTOTAL, CKMB, CKMBINDEX, TROPONINI in the last 168 hours.  BNP: Invalid input(s): POCBNP  CBG: Recent Labs  Lab 08/04/19 1137 08/04/19 1747 08/04/19 2112 08/05/19 0745 08/05/19 1141  GLUCAP 328* 229* 244* 179* 181*    Microbiology: Results for orders placed or performed during the hospital encounter of 07/30/19  Culture, blood (Routine X 2) w Reflex to ID Panel     Status: None   Collection Time: 07/31/19 12:27 AM   Specimen: BLOOD  Result Value Ref Range Status   Specimen Description BLOOD RIGHT WRIST  Final   Special Requests   Final    BOTTLES DRAWN AEROBIC AND ANAEROBIC Blood Culture adequate volume   Culture   Final    NO GROWTH 5 DAYS Performed at [facility], [place]  [address] [place], [place] [address]    Report Status 08/05/2019 FINAL  Final  Culture, blood (Routine X 2) w Reflex to ID Panel     Status: None   Collection Time: 07/31/19 12:44 AM   Specimen: BLOOD  Result Value Ref Range Status   Specimen Description BLOOD BLOOD RIGHT WRIST  Final   Special Requests   Final    BOTTLES DRAWN AEROBIC AND ANAEROBIC Blood Culture adequate volume   Culture   Final    NO GROWTH 5 DAYS Performed at [facility], [address] [place], [place] [address]    Report Status 08/05/2019 FINAL  Final  MRSA PCR Screening     Status: None   Collection Time: 08/01/19 11:12 PM   Specimen: Nasopharyngeal  Result Value Ref Range Status   MRSA by PCR NEGATIVE NEGATIVE Final    Comment:        The GeneXpert MRSA Assay (FDA approved for NASAL specimens only), is one component of a comprehensive  MRSA colonization surveillance program. It is not intended to diagnose MRSA infection nor to guide or monitor treatment for MRSA infections. Performed at [facility], [address] [place], [place] [address]     Coagulation Studies: No results for input(s): LABPROT, INR in the last 72 hours.  Urinalysis: No results for input(s): COLORURINE, LABSPEC, PHURINE, GLUCOSEU, HGBUR, BILIRUBINUR, KETONESUR, PROTEINUR, UROBILINOGEN, NITRITE, LEUKOCYTESUR in the last 72 hours.  Invalid input(s): APPERANCEUR    Imaging: MR BRAIN WO CONTRAST  Result Date: 08/04/2019 CLINICAL DATA:  Altered mental status, COVID infection EXAM: MRI HEAD WITHOUT CONTRAST TECHNIQUE: Multiplanar, multiecho pulse sequences of the brain and surrounding structures were obtained without intravenous contrast. COMPARISON:  01/01/2019 FINDINGS: Motion artifact is present. Brain: There is no acute infarction or intracranial hemorrhage. There is no intracranial mass, mass effect, or edema. There is no hydrocephalus or extra-axial fluid collection. Prominence of the ventricles and sulci reflecting stable parenchymal volume loss. Multiple chronic infarcts are again identified including of the bilateral frontal lobes, left parietal temporal lobes, left basal ganglia, and bilateral cerebellar hemispheres. Additional patchy and confluent T2 hyperintensity in the supratentorial white matter likely reflects stable chronic microvascular ischemic changes. Vascular: Major vessel flow voids at the skull base are preserved. Skull and upper cervical spine: Normal marrow signal is preserved. Sinuses/Orbits: Paranasal sinuses are aerated. Orbits are unremarkable. Other: Partially empty sella.  Mastoid air cells are clear. IMPRESSION: No significant change since 01/01/2019. No acute infarction, hemorrhage, or mass. Chronic findings detailed above. Electronically Signed   By: Macy Mis M.D.   On: 08/04/2019 17:24   CUP PACEART REMOTE  DEVICE CHECK  Result Date: 08/04/2019 Carelink summary report received. Battery status OK. Normal device function. No new symptom episodes, tachy episodes, brady, or pause episodes. No new AF episodes. Monthly summary reports and ROV/PRN. SChancey    Medications:   . sodium chloride    . sodium chloride    . sodium chloride Stopped (08/04/19 1200)   . vitamin C  500 mg Oral Daily  . atorvastatin  80 mg Oral QPM  . calcium acetate  2,001 mg Oral TID WC  . cholecalciferol  1,000 Units Oral Daily  . dexamethasone  4 mg Oral Daily  . [START ON 08/06/2019] epoetin (EPOGEN/PROCRIT) injection  10,000 Units Intravenous Q T,Th,Sa-HD  . furosemide  60 mg Intravenous Once  . guaiFENesin  600 mg Oral BID  . insulin aspart  0-5 Units Subcutaneous QHS  . insulin aspart  0-6 Units Subcutaneous TID WC  . insulin aspart  3 Units Subcutaneous TID WC  . insulin detemir  8 Units Subcutaneous BID  . Ipratropium-Albuterol  1 puff Inhalation Q6H  . levETIRAcetam  1,000 mg Oral Daily  . levETIRAcetam  250 mg Oral Once per day on Mon Wed Fri  . metoprolol succinate  50 mg Oral Daily  . pantoprazole (PROTONIX) IV  40 mg Intravenous Q24H  . zinc sulfate  220 mg Oral Daily   sodium chloride, sodium chloride, sodium chloride, acetaminophen, guaiFENesin-dextromethorphan, heparin, heparin, lidocaine-prilocaine, loperamide, magnesium hydroxide, ondansetron **OR** ondansetron (ZOFRAN) IV, pentafluoroprop-tetrafluoroeth, traZODone  Assessment/ Plan:  69 y.o. 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  ESRD on HD MWF but temporarily on TTS.  Patient likely to be transitioning to cohort dialysis unit.  Therefore she will need TTS dialysis seat.   Next treatment for tomorrow   2.  Anemia of chronic kidney disease.   - Will resume on next HD treatment.   3.  Secondary hyperparathyroidism: - calcium acetate with meals.   4. Hypertension:   - metoprolol dose decreased - holding torsemide, losartan and amlodipine.    LOS: 6 Meredith Kilbride 1/11/20214:38 PM

## 2019-08-05 NOTE — Progress Notes (Signed)
Patient ID: Cynthia Dean, female   DOB: Dec 15, 1950, 69 y.o.   MRN: [id number] [facility] PROGRESS NOTE  Cynthia Dean NFA:[id number] DOB: 12-17-1950 DOA: 07/30/2019 PCP: Lowella Bandy, MD  HPI/Subjective: As per nurse, patient had a solid bowel movement this morning.  Did have diarrhea before that.  No abdominal pain.  No shortness of breath.  No cough.  Objective: Vitals:   08/05/19 0800 08/05/19 1142  BP:  114/63  Pulse:  72  Resp:  20  Temp:  98.6 F (37 C)  SpO2: 92% 91%    Filed Weights   08/01/19 1430 08/03/19 0037 08/03/19 0501  Weight: 105.7 kg 105.7 kg 133.5 kg    ROS: Review of Systems  Unable to perform ROS: Acuity of condition  Respiratory: Negative for shortness of breath.   Cardiovascular: Negative for chest pain.  Gastrointestinal: Positive for diarrhea. Negative for abdominal pain.   Exam: Physical Exam  HENT:  Nose: No mucosal edema.  Mouth/Throat: No oropharyngeal exudate or posterior oropharyngeal edema.  Eyes: Conjunctivae and lids are normal.  Neck: Carotid bruit is not present.  Cardiovascular: S1 normal and S2 normal. Exam reveals no gallop.  Murmur heard.  Systolic murmur is present with a grade of 2/6. Respiratory: No respiratory distress. She has decreased breath sounds in the right lower field and the left lower field. She has no wheezes. She has rhonchi in the right lower field and the left lower field. She has no rales.  GI: Soft. Bowel sounds are normal. There is no abdominal tenderness.  Musculoskeletal:     Right ankle: Swelling present.     Left ankle: Swelling present.  Lymphadenopathy:    She has no cervical adenopathy.  Neurological: She is alert.  Able to follow some simple commands.  Slow with her answers  Skin: Skin is warm. No rash noted. Nails show no clubbing.  Psychiatric:  Patient still slow with answers      Data Reviewed: Basic Metabolic Panel: Recent Labs  Lab 08/01/19 0409  08/01/19 0715 08/02/19 0214 08/03/19 0622 08/04/19 0517 08/05/19 0612  NA 144 143 140 142 140 141  K 6.2* 5.9* 5.7* 6.0* 5.2* 5.3*  CL 101 101 98 98 97* 98  CO2 20* 20* 22 19* 20* 22  GLUCOSE 130* 80 160* 227* 255* 174*  BUN 79* 85* 71* 114* 87* 112*  CREATININE 14.72* 14.32* 11.84* 14.22* 11.11* 13.02*  CALCIUM 8.6* 8.6* 8.4* 9.2 8.7* 8.8*  MG 2.0  --   --   --   --   --    Liver Function Tests: Recent Labs  Lab 08/01/19 0409 08/01/19 0715 08/02/19 0214 08/03/19 0622 08/04/19 0517  AST 27 28 75* 71* 53*  ALT 17 17 21 20 20   ALKPHOS 68 69 62 57 56  BILITOT 0.7 0.7 0.8 0.8 0.8  PROT 7.3 7.5 7.0 6.6 6.5  ALBUMIN 3.3* 3.3* 3.1* 2.9* 2.8*   CBC: Recent Labs  Lab 07/31/19 0210 08/01/19 0715 08/02/19 0214 08/02/19 1327 08/03/19 0622 08/04/19 0517 08/05/19 0612  WBC 6.9 8.7 7.0  --  7.2 6.7 9.2  NEUTROABS 5.8 6.8 4.9  --  5.1 5.0  --   HGB 11.1* 10.8* 10.2* 10.8* 10.1* 9.2* 9.2*  HCT 35.7* 34.7* 32.9*  --  33.4* 29.6* 29.1*  MCV 87.9 88.3 88.4  --  88.8 87.8 87.7  PLT 146* 149* 126*  --  145* 165 187   BNP (last 3 results) Recent Labs  12/31/18 0818 07/30/19 1957  BNP 528.0* 451.0*     CBG: Recent Labs  Lab 08/04/19 1137 08/04/19 1747 08/04/19 2112 08/05/19 0745 08/05/19 1141  GLUCAP 328* 229* 244* 179* 181*    Recent Results (from the past 240 hour(s))  Culture, blood (Routine X 2) w Reflex to ID Panel     Status: None   Collection Time: 07/31/19 12:27 AM   Specimen: BLOOD  Result Value Ref Range Status   Specimen Description BLOOD RIGHT WRIST  Final   Special Requests   Final    BOTTLES DRAWN AEROBIC AND ANAEROBIC Blood Culture adequate volume   Culture   Final    NO GROWTH 5 DAYS Performed at [facility], [address] [place], [place] [address]    Report Status 08/05/2019 FINAL  Final  Culture, blood (Routine X 2) w Reflex to ID Panel     Status: None   Collection Time: 07/31/19 12:44 AM   Specimen: BLOOD  Result Value  Ref Range Status   Specimen Description BLOOD BLOOD RIGHT WRIST  Final   Special Requests   Final    BOTTLES DRAWN AEROBIC AND ANAEROBIC Blood Culture adequate volume   Culture   Final    NO GROWTH 5 DAYS Performed at [facility], [address] [place], [place] [address]    Report Status 08/05/2019 FINAL  Final  MRSA PCR Screening     Status: None   Collection Time: 08/01/19 11:12 PM   Specimen: Nasopharyngeal  Result Value Ref Range Status   MRSA by PCR NEGATIVE NEGATIVE Final    Comment:        The GeneXpert MRSA Assay (FDA approved for NASAL specimens only), is one component of a comprehensive MRSA colonization surveillance program. It is not intended to diagnose MRSA infection nor to guide or monitor treatment for MRSA infections. Performed at [facility], [address] [place], [place] [address]     Scheduled Meds: . vitamin C  500 mg Oral Daily  . atorvastatin  80 mg Oral QPM  . calcium acetate  2,001 mg Oral TID WC  . cholecalciferol  1,000 Units Oral Daily  . dexamethasone  4 mg Oral Daily  . [START ON 08/06/2019] epoetin (EPOGEN/PROCRIT) injection  10,000 Units Intravenous Q T,Th,Sa-HD  . furosemide  60 mg Intravenous Once  . guaiFENesin  600 mg Oral BID  . insulin aspart  0-5 Units Subcutaneous QHS  . insulin aspart  0-6 Units Subcutaneous TID WC  . insulin aspart  3 Units Subcutaneous TID WC  . insulin detemir  8 Units Subcutaneous BID  . Ipratropium-Albuterol  1 puff Inhalation Q6H  . levETIRAcetam  1,000 mg Oral Daily  . levETIRAcetam  250 mg Oral Once per day on Mon Wed Fri  . metoprolol succinate  50 mg Oral Daily  . pantoprazole (PROTONIX) IV  40 mg Intravenous Q24H  . zinc sulfate  220 mg Oral Daily   Continuous Infusions: . sodium chloride    . sodium chloride    . sodium chloride Stopped (08/04/19 1200)    Assessment/Plan:  1. Acute on chronic hypoxic respiratory failure secondary to fluid overload from missing  dialysis and COVID-19 pneumonia.  Tapered oxygen to baseline 2 L. 2. Acute diastolic congestive heart failure and fluid overload. Patient has had 3 dialysis sessions in this hospitalization. Fluid status seems improved. 3. Multifocal pneumonia secondary to Covid 19 with fever.  Remdesivir course completed.  Continue to taper steroids.  Continue vitamin C and zinc.  Combivent inhaler.  Finished Rocephin and Zithromax 4. Acute metabolic encephalopathy.  Likely from Covid infection.  MRI brain negative.  Gabapentin discontinued.  Continue to monitor.  Very concerned about her mental status. 5. Diarrhea.  Likely secondary to Covid infection.  As needed Imodium.  Hemoglobin drifted down to 9.2 but seems to have stabilized.  I did stop aspirin and Plavix for right now just in case there is any bleeding. 6. Hyperkalemia.  Dialysis to manage this. 7. Type 2 diabetes mellitus with end-stage renal disease.  Hemoglobin A1c elevated 8.3.  Continue Levemir and short acting insulin.  Continue sliding scale.  Sugars coming down with tapering steroids 8. Hyperlipidemia unspecified on atorvastatin 9. Seizure history on Keppra 10. Essential hypertension.  Blood pressure better with cutting out medications.  Patient only on low-dose Toprol at this point. 11. Weakness.  Physical therapy evaluation.  Need to see if patient can walk prior to discharge home.  Code Status:     Code Status Orders  (From admission, onward)         Start     Ordered   07/30/19 2218  Full code  Continuous     07/30/19 2224        Code Status History    Date Active Date Inactive Code Status Order ID Comments User Context   02/13/2019 1329 02/16/2019 1849 Full Code [id number]  Lang Snow, NP ED   12/31/2018 1159 01/02/2019 1330 Full Code [id number]  Lang Snow, NP ED   05/29/2018 1541 05/30/2018 2041 Full Code [id number]  Loletha Grayer, MD ED   03/14/2018 0141 03/15/2018 0336 Full Code [id number]  Amelia Jo, MD  Inpatient   03/08/2018 0005 03/09/2018 2108 Full Code [id number]  Vaughan Basta, MD Inpatient   11/15/2017 0017 11/17/2017 1809 Full Code [id number]  Lance Coon, MD ED   11/12/2017 2219 11/13/2017 1935 Full Code [id number]  Lance Coon, MD ED   04/14/2016 1330 04/14/2016 1700 Full Code [id number]  Bettey Costa, MD ED   08/08/2015 2257 08/09/2015 2054 Full Code [id number]  Lavetta Nielsen, Aaron Mose, MD ED   04/17/2015 1334 04/17/2015 1836 Full Code [id number]  Dew, Erskine Squibb, MD Inpatient   Advance Care Planning Activity     Disposition Plan: Need to see how patient is able to walk prior to disposition  Consultants:  Nephrology  Antibiotics: -Rocephin and Zithromax (course completed) -Remdesivir (course completed)  Time spent: 27 minutes, spoke with fianc today  [employer]

## 2019-08-06 DIAGNOSIS — R569 Unspecified convulsions: Secondary | ICD-10-CM

## 2019-08-06 LAB — RENAL FUNCTION PANEL
Albumin: 2.8 g/dL — ABNORMAL LOW (ref 3.5–5.0)
Anion gap: 23 — ABNORMAL HIGH (ref 5–15)
BUN: 144 mg/dL — ABNORMAL HIGH (ref 8–23)
CO2: 19 mmol/L — ABNORMAL LOW (ref 22–32)
Calcium: 8.6 mg/dL — ABNORMAL LOW (ref 8.9–10.3)
Chloride: 94 mmol/L — ABNORMAL LOW (ref 98–111)
Creatinine, Ser: 15.11 mg/dL — ABNORMAL HIGH (ref 0.44–1.00)
GFR calc Af Amer: 3 mL/min — ABNORMAL LOW (ref >60)
GFR calc non Af Amer: 2 mL/min — ABNORMAL LOW (ref >60)
Glucose, Bld: 255 mg/dL — ABNORMAL HIGH (ref 70–99)
Phosphorus: 8.1 mg/dL — ABNORMAL HIGH (ref 2.5–4.6)
Potassium: 5.7 mmol/L — ABNORMAL HIGH (ref 3.5–5.1)
Sodium: 136 mmol/L (ref 135–145)

## 2019-08-06 LAB — GLUCOSE, CAPILLARY
Glucose-Capillary: 263 mg/dL — ABNORMAL HIGH (ref 70–99)
Glucose-Capillary: 226 mg/dL — ABNORMAL HIGH (ref 70–99)
Glucose-Capillary: 198 mg/dL — ABNORMAL HIGH (ref 70–99)
Glucose-Capillary: 185 mg/dL — ABNORMAL HIGH (ref 70–99)

## 2019-08-06 LAB — CBC
HCT: 28.6 % — ABNORMAL LOW (ref 36.0–46.0)
Hemoglobin: 8.9 g/dL — ABNORMAL LOW (ref 12.0–15.0)
MCH: 27 pg (ref 26.0–34.0)
MCHC: 31.1 g/dL (ref 30.0–36.0)
MCV: 86.7 fL (ref 80.0–100.0)
Platelets: 200 10*3/uL (ref 150–400)
RBC: 3.3 MIL/uL — ABNORMAL LOW (ref 3.87–5.11)
RDW: 14.6 % (ref 11.5–15.5)
WBC: 10 10*3/uL (ref 4.0–10.5)
nRBC: 0 % (ref 0.0–0.2)

## 2019-08-06 MED ORDER — PANTOPRAZOLE SODIUM 40 MG PO TBEC
40.0000 mg | DELAYED_RELEASE_TABLET | Freq: Every day | ORAL | Status: DC
  Administered 2019-08-07 – 2019-08-11 (×5): 40 mg via ORAL
  Filled 2019-08-06 (×5): qty 1

## 2019-08-06 MED ORDER — DEXAMETHASONE 4 MG PO TABS: 2.0000 mg | ORAL_TABLET | Freq: Every day | ORAL | Status: DC

## 2019-08-06 MED ORDER — DEXAMETHASONE 0.5 MG PO TABS
3.0000 mg | ORAL_TABLET | Freq: Every day | ORAL | Status: DC
  Administered 2019-08-06: 09:00:00 3 mg via ORAL
  Filled 2019-08-06: qty 6

## 2019-08-06 NOTE — Progress Notes (Signed)
   08/06/19 1330  Neurological  Level of Consciousness Alert  Orientation Level Oriented to person;Oriented to situation  Respiratory  Respiratory Pattern Regular;Unlabored  Bilateral Breath Sounds Diminished  Cardiac  Pulse Regular  Heart Sounds S1, S2  ECG Monitor Yes  STABLE FOR HD TX NO C/OS AVG +/+ UFG 2L

## 2019-08-06 NOTE — Progress Notes (Signed)
Attempted to see several times, still off of unit. Will see tomorrow am 1/13.  Roland Rack, MD [facility] [phone]  If 7pm- 7am, please page neurology on call as listed in [place].

## 2019-08-06 NOTE — Progress Notes (Signed)
Patient ID: Cynthia Dean, female   DOB: Jun 10, 1951, 69 y.o.   MRN: [id number] [facility] PROGRESS NOTE  Isobel Eisenhuth DXA:[id number] DOB: 12/10/1950 DOA: 07/30/2019 PCP: Lowella Bandy, MD  HPI/Subjective: Patient seen this morning.  She was very slow with her answers.  Able to move her extremities to command.  Could not tell me if she had any further diarrhea or not.  Mention she did have some abdominal pain.  Objective: Vitals:   08/06/19 0530 08/06/19 1211  BP: (!) 151/57 (!) 155/64  Pulse: 73 70  Resp: 20 17  Temp: 98.5 F (36.9 C) 98 F (36.7 C)  SpO2: 97% 90%    Filed Weights   08/01/19 1430 08/03/19 0037 08/03/19 0501  Weight: 105.7 kg 105.7 kg 133.5 kg    ROS: Review of Systems  Unable to perform ROS: Acuity of condition  Respiratory: Negative for shortness of breath.   Cardiovascular: Negative for chest pain.  Gastrointestinal: Positive for abdominal pain.   Exam: Physical Exam  HENT:  Nose: No mucosal edema.  Mouth/Throat: No oropharyngeal exudate or posterior oropharyngeal edema.  Eyes: Conjunctivae and lids are normal.  Neck: Carotid bruit is not present.  Cardiovascular: S1 normal and S2 normal. Exam reveals no gallop.  Murmur heard.  Systolic murmur is present with a grade of 2/6. Respiratory: No respiratory distress. She has decreased breath sounds in the right lower field and the left lower field. She has no wheezes. She has rhonchi in the right lower field and the left lower field. She has no rales.  GI: Soft. Bowel sounds are normal. There is no abdominal tenderness.  Musculoskeletal:     Right ankle: Swelling present.     Left ankle: Swelling present.  Lymphadenopathy:    She has no cervical adenopathy.  Neurological: She is alert.  Able to follow some simple commands.  Slow with her answers  Skin: Skin is warm. No rash noted. Nails show no clubbing.  Psychiatric:  Patient still slow with answers      Data  Reviewed: Basic Metabolic Panel: Recent Labs  Lab 08/01/19 0409 08/02/19 0214 08/03/19 0622 08/04/19 0517 08/05/19 0612 08/06/19 1226  NA 144 140 142 140 141 136  K 6.2* 5.7* 6.0* 5.2* 5.3* 5.7*  CL 101 98 98 97* 98 94*  CO2 20* 22 19* 20* 22 19*  GLUCOSE 130* 160* 227* 255* 174* 255*  BUN 79* 71* 114* 87* 112* 144*  CREATININE 14.72* 11.84* 14.22* 11.11* 13.02* 15.11*  CALCIUM 8.6* 8.4* 9.2 8.7* 8.8* 8.6*  MG 2.0  --   --   --   --   --   PHOS  --   --   --   --   --  8.1*   Liver Function Tests: Recent Labs  Lab 08/01/19 0409 08/01/19 0715 08/02/19 0214 08/03/19 0622 08/04/19 0517 08/06/19 1226  AST 27 28 75* 71* 53*  --   ALT 17 17 21 20 20   --   ALKPHOS 68 69 62 57 56  --   BILITOT 0.7 0.7 0.8 0.8 0.8  --   PROT 7.3 7.5 7.0 6.6 6.5  --   ALBUMIN 3.3* 3.3* 3.1* 2.9* 2.8* 2.8*   CBC: Recent Labs  Lab 07/31/19 0210 08/01/19 0715 08/02/19 0214 08/02/19 1327 08/03/19 0622 08/04/19 0517 08/05/19 0612 08/06/19 1226  WBC 6.9 8.7 7.0  --  7.2 6.7 9.2 10.0  NEUTROABS 5.8 6.8 4.9  --  5.1 5.0  --   --  HGB 11.1* 10.8* 10.2* 10.8* 10.1* 9.2* 9.2* 8.9*  HCT 35.7* 34.7* 32.9*  --  33.4* 29.6* 29.1* 28.6*  MCV 87.9 88.3 88.4  --  88.8 87.8 87.7 86.7  PLT 146* 149* 126*  --  145* 165 187 200   BNP (last 3 results) Recent Labs    12/31/18 0818 07/30/19 1957  BNP 528.0* 451.0*     CBG: Recent Labs  Lab 08/05/19 1141 08/05/19 1705 08/05/19 2113 08/06/19 0800 08/06/19 1212  GLUCAP 181* 248* 292* 263* 226*    Recent Results (from the past 240 hour(s))  Culture, blood (Routine X 2) w Reflex to ID Panel     Status: None   Collection Time: 07/31/19 12:27 AM   Specimen: BLOOD  Result Value Ref Range Status   Specimen Description BLOOD RIGHT WRIST  Final   Special Requests   Final    BOTTLES DRAWN AEROBIC AND ANAEROBIC Blood Culture adequate volume   Culture   Final    NO GROWTH 5 DAYS Performed at [facility], [address]  [place], [place] [address]    Report Status 08/05/2019 FINAL  Final  Culture, blood (Routine X 2) w Reflex to ID Panel     Status: None   Collection Time: 07/31/19 12:44 AM   Specimen: BLOOD  Result Value Ref Range Status   Specimen Description BLOOD BLOOD RIGHT WRIST  Final   Special Requests   Final    BOTTLES DRAWN AEROBIC AND ANAEROBIC Blood Culture adequate volume   Culture   Final    NO GROWTH 5 DAYS Performed at [facility], [address] [place], [place] [address]    Report Status 08/05/2019 FINAL  Final  MRSA PCR Screening     Status: None   Collection Time: 08/01/19 11:12 PM   Specimen: Nasopharyngeal  Result Value Ref Range Status   MRSA by PCR NEGATIVE NEGATIVE Final    Comment:        The GeneXpert MRSA Assay (FDA approved for NASAL specimens only), is one component of a comprehensive MRSA colonization surveillance program. It is not intended to diagnose MRSA infection nor to guide or monitor treatment for MRSA infections. Performed at [facility], [address] [place], [place] [address]     Scheduled Meds: . vitamin C  500 mg Oral Daily  . atorvastatin  80 mg Oral QPM  . calcium acetate  2,001 mg Oral TID WC  . cholecalciferol  1,000 Units Oral Daily  . [START ON 08/07/2019] dexamethasone  2 mg Oral Daily  . epoetin (EPOGEN/PROCRIT) injection  10,000 Units Intravenous Q T,Th,Sa-HD  . furosemide  60 mg Intravenous Once  . guaiFENesin  600 mg Oral BID  . insulin aspart  0-5 Units Subcutaneous QHS  . insulin aspart  0-6 Units Subcutaneous TID WC  . insulin aspart  3 Units Subcutaneous TID WC  . insulin detemir  8 Units Subcutaneous BID  . Ipratropium-Albuterol  1 puff Inhalation Q6H  . levETIRAcetam  1,000 mg Oral Daily  . levETIRAcetam  250 mg Oral Once per day on Mon Wed Fri  . metoprolol succinate  50 mg Oral Daily  . [START ON 08/07/2019] pantoprazole  40 mg Oral Daily  . zinc sulfate  220 mg Oral Daily   Continuous Infusions: .  sodium chloride    . sodium chloride    . sodium chloride Stopped (08/04/19 1200)    Assessment/Plan:  1. Acute on chronic hypoxic respiratory failure secondary  to fluid overload from missing dialysis and COVID-19 pneumonia.  Patient up to 4 L of oxygen with pulse ox 90%. 2. Acute diastolic congestive heart failure and fluid overload. Fluid status improved.  Dialysis to manage fluid 3. Multifocal pneumonia secondary to Covid 19 with fever.  Remdesivir course completed.  Discontinue steroids secondary to her altered mental status.  Continue vitamin C and zinc.  Combivent inhaler.  Finished Rocephin and Zithromax. 4. Acute metabolic encephalopathy.  Likely from Covid infection.  MRI brain negative.  Gabapentin discontinued.  Continue to monitor.  Very concerned about her mental status.  Neurology consultation requested.  Daughter also concerned about her mental status. 5. Diarrhea, seems to have settled down with Imodium.  Likely secondary to Covid infection.   Hemoglobin drifted down to 8.9.  I did stop aspirin and Plavix for right now just in case there is any bleeding. 6. Hyperkalemia which is is persistent.  Dialysis to manage this. 7. Type 2 diabetes mellitus with end-stage renal disease.  Hemoglobin A1c elevated 8.3.  Continue Levemir and short acting insulin.  Continue sliding scale.  Sugars coming down with tapering steroids 8. Hyperlipidemia unspecified on atorvastatin 9. Seizure history on Keppra 10. Essential hypertension.  Blood pressure better with cutting out medications.  Patient only on low-dose Toprol at this point. 11. Weakness.  Physical therapy evaluation.  Physical therapy recommends home with home health.  Code Status:     Code Status Orders  (From admission, onward)         Start     Ordered   07/30/19 2218  Full code  Continuous     07/30/19 2224        Code Status History    Date Active Date Inactive Code Status Order ID Comments User Context   02/13/2019 1329  02/16/2019 1849 Full Code [id number]  Lang Snow, NP ED   12/31/2018 1159 01/02/2019 1330 Full Code [id number]  Lang Snow, NP ED   05/29/2018 1541 05/30/2018 2041 Full Code [id number]  Loletha Grayer, MD ED   03/14/2018 0141 03/15/2018 0336 Full Code [id number]  Amelia Jo, MD Inpatient   03/08/2018 0005 03/09/2018 2108 Full Code [id number]  Vaughan Basta, MD Inpatient   11/15/2017 0017 11/17/2017 1809 Full Code [id number]  Lance Coon, MD ED   11/12/2017 2219 11/13/2017 1935 Full Code [id number]  Lance Coon, MD ED   04/14/2016 1330 04/14/2016 1700 Full Code [id number]  Bettey Costa, MD ED   08/08/2015 2257 08/09/2015 2054 Full Code [id number]  Lavetta Nielsen, Aaron Mose, MD ED   04/17/2015 1334 04/17/2015 1836 Full Code [id number]  Dew, Erskine Squibb, MD Inpatient   Advance Care Planning Activity     Disposition Plan: Was hoping mental status to be better prior to disposition.  Neurology evaluation today.  Dialysis this afternoon.  Hopefully can go home with home health soon.  Consultants:  Nephrology  Antibiotics: -Rocephin and Zithromax (course completed) -Remdesivir (course completed)  Time spent: 28 minutes, spoke with daughter on the phone  [employer]  [facility]

## 2019-08-06 NOTE — Progress Notes (Addendum)
Patient returned to room and did not have Nasal cannula placed. Oxygen saturation was 75%, [place] was placed and O2 turned up to 6L/min. Saturations resolved patient is now on 2L/min with sats 98%.

## 2019-08-06 NOTE — Progress Notes (Signed)
Spoke with daughter, Carmel [place]. She states that when pt gets sick, she does tend to have AMS, otherwise is WDL at home.

## 2019-08-06 NOTE — Progress Notes (Signed)
[facility]  ROUNDING NOTE   Subjective:   Seen and examined on hemodialysis treatment. Confused.    HEMODIALYSIS FLOWSHEET:  Blood Flow Rate (mL/min): 400 mL/min Arterial Pressure (mmHg): -220 mmHg Venous Pressure (mmHg): 240 mmHg Transmembrane Pressure (mmHg): 70 mmHg Ultrafiltration Rate (mL/min): 540 mL/min Dialysate Flow Rate (mL/min): 600 ml/min Conductivity: Machine : 14 Conductivity: Machine : 14 Dialysis Fluid Bolus: Normal Saline Bolus Amount (mL): 250 mL    Objective:  Vital signs in last 24 hours:  Temp:  [97.8 F (36.6 C)-98.5 F (36.9 C)] 97.8 F (36.6 C) (01/12 1615) Pulse Rate:  [68-78] 71 (01/12 1630) Resp:  [16-20] 20 (01/12 1630) BP: (100-171)/(53-79) 143/66 (01/12 1630) SpO2:  [88 %-97 %] 93 % (01/12 1630)  Weight change:  Filed Weights   08/01/19 1430 08/03/19 0037 08/03/19 0501  Weight: 105.7 kg 105.7 kg 133.5 kg    Intake/Output: I/O last 3 completed shifts: In: 100 [P.O.:100] Out: 0    Intake/Output this shift:  Total I/O In: -  Out: 1500 [Other:1500]  Physical Exam: General: NAD,   Head: Normocephalic, atraumatic. Moist oral mucosal membranes  Eyes: Anicteric, PERRL  Neck: Supple, trachea midline  Lungs:  Clear to auscultation  Heart: Regular rate and rhythm  Abdomen:  Soft, nontender,   Extremities:  no peripheral edema.  Neurologic: Nonfocal, moving all four extremities  Skin: No lesions  Access: Left AVG    Basic Metabolic Panel: Recent Labs  Lab 08/01/19 0409 08/02/19 0214 08/03/19 0622 08/04/19 0517 08/05/19 0612 08/06/19 1226  NA 144 140 142 140 141 136  K 6.2* 5.7* 6.0* 5.2* 5.3* 5.7*  CL 101 98 98 97* 98 94*  CO2 20* 22 19* 20* 22 19*  GLUCOSE 130* 160* 227* 255* 174* 255*  BUN 79* 71* 114* 87* 112* 144*  CREATININE 14.72* 11.84* 14.22* 11.11* 13.02* 15.11*  CALCIUM 8.6* 8.4* 9.2 8.7* 8.8* 8.6*  MG 2.0  --   --   --   --   --   PHOS  --   --   --   --   --  8.1*    Liver Function  Tests: Recent Labs  Lab 08/01/19 0409 08/01/19 0715 08/02/19 0214 08/03/19 0622 08/04/19 0517 08/06/19 1226  AST 27 28 75* 71* 53*  --   ALT 17 17 21 20 20   --   ALKPHOS 68 69 62 57 56  --   BILITOT 0.7 0.7 0.8 0.8 0.8  --   PROT 7.3 7.5 7.0 6.6 6.5  --   ALBUMIN 3.3* 3.3* 3.1* 2.9* 2.8* 2.8*   No results for input(s): LIPASE, AMYLASE in the last 168 hours. Recent Labs  Lab 08/01/19 0409  AMMONIA 14    CBC: Recent Labs  Lab 07/31/19 0210 08/01/19 0715 08/02/19 0214 08/02/19 1327 08/03/19 0622 08/04/19 0517 08/05/19 0612 08/06/19 1226  WBC 6.9 8.7 7.0  --  7.2 6.7 9.2 10.0  NEUTROABS 5.8 6.8 4.9  --  5.1 5.0  --   --   HGB 11.1* 10.8* 10.2* 10.8* 10.1* 9.2* 9.2* 8.9*  HCT 35.7* 34.7* 32.9*  --  33.4* 29.6* 29.1* 28.6*  MCV 87.9 88.3 88.4  --  88.8 87.8 87.7 86.7  PLT 146* 149* 126*  --  145* 165 187 200    Cardiac Enzymes: No results for input(s): CKTOTAL, CKMB, CKMBINDEX, TROPONINI in the last 168 hours.  BNP: Invalid input(s): POCBNP  CBG: Recent Labs  Lab 08/05/19 1141 08/05/19 1705 08/05/19 2113 08/06/19  0800 08/06/19 1212  GLUCAP 181* 248* 292* 263* 226*    Microbiology: Results for orders placed or performed during the hospital encounter of 07/30/19  Culture, blood (Routine X 2) w Reflex to ID Panel     Status: None   Collection Time: 07/31/19 12:27 AM   Specimen: BLOOD  Result Value Ref Range Status   Specimen Description BLOOD RIGHT WRIST  Final   Special Requests   Final    BOTTLES DRAWN AEROBIC AND ANAEROBIC Blood Culture adequate volume   Culture   Final    NO GROWTH 5 DAYS Performed at [facility], [address] [place], [place] [address]    Report Status 08/05/2019 FINAL  Final  Culture, blood (Routine X 2) w Reflex to ID Panel     Status: None   Collection Time: 07/31/19 12:44 AM   Specimen: BLOOD  Result Value Ref Range Status   Specimen Description BLOOD BLOOD RIGHT WRIST  Final   Special Requests   Final     BOTTLES DRAWN AEROBIC AND ANAEROBIC Blood Culture adequate volume   Culture   Final    NO GROWTH 5 DAYS Performed at [facility], [address] [place], [place] [address]    Report Status 08/05/2019 FINAL  Final  MRSA PCR Screening     Status: None   Collection Time: 08/01/19 11:12 PM   Specimen: Nasopharyngeal  Result Value Ref Range Status   MRSA by PCR NEGATIVE NEGATIVE Final    Comment:        The GeneXpert MRSA Assay (FDA approved for NASAL specimens only), is one component of a comprehensive MRSA colonization surveillance program. It is not intended to diagnose MRSA infection nor to guide or monitor treatment for MRSA infections. Performed at [facility], [address] [place], [place] [address]     Coagulation Studies: No results for input(s): LABPROT, INR in the last 72 hours.  Urinalysis: No results for input(s): COLORURINE, LABSPEC, PHURINE, GLUCOSEU, HGBUR, BILIRUBINUR, KETONESUR, PROTEINUR, UROBILINOGEN, NITRITE, LEUKOCYTESUR in the last 72 hours.  Invalid input(s): APPERANCEUR    Imaging: MR BRAIN WO CONTRAST  Result Date: 08/04/2019 CLINICAL DATA:  Altered mental status, COVID infection EXAM: MRI HEAD WITHOUT CONTRAST TECHNIQUE: Multiplanar, multiecho pulse sequences of the brain and surrounding structures were obtained without intravenous contrast. COMPARISON:  01/01/2019 FINDINGS: Motion artifact is present. Brain: There is no acute infarction or intracranial hemorrhage. There is no intracranial mass, mass effect, or edema. There is no hydrocephalus or extra-axial fluid collection. Prominence of the ventricles and sulci reflecting stable parenchymal volume loss. Multiple chronic infarcts are again identified including of the bilateral frontal lobes, left parietal temporal lobes, left basal ganglia, and bilateral cerebellar hemispheres. Additional patchy and confluent T2 hyperintensity in the supratentorial white matter likely reflects  stable chronic microvascular ischemic changes. Vascular: Major vessel flow voids at the skull base are preserved. Skull and upper cervical spine: Normal marrow signal is preserved. Sinuses/Orbits: Paranasal sinuses are aerated. Orbits are unremarkable. Other: Partially empty sella.  Mastoid air cells are clear. IMPRESSION: No significant change since 01/01/2019. No acute infarction, hemorrhage, or mass. Chronic findings detailed above. Electronically Signed   By: Macy Mis M.D.   On: 08/04/2019 17:24     Medications:   . sodium chloride    . sodium chloride    . sodium chloride Stopped (08/04/19 1200)   . vitamin C  500 mg Oral Daily  . atorvastatin  80 mg Oral QPM  .  calcium acetate  2,001 mg Oral TID WC  . cholecalciferol  1,000 Units Oral Daily  . epoetin (EPOGEN/PROCRIT) injection  10,000 Units Intravenous Q T,Th,Sa-HD  . furosemide  60 mg Intravenous Once  . guaiFENesin  600 mg Oral BID  . insulin aspart  0-5 Units Subcutaneous QHS  . insulin aspart  0-6 Units Subcutaneous TID WC  . insulin aspart  3 Units Subcutaneous TID WC  . insulin detemir  8 Units Subcutaneous BID  . Ipratropium-Albuterol  1 puff Inhalation Q6H  . levETIRAcetam  1,000 mg Oral Daily  . levETIRAcetam  250 mg Oral Once per day on Mon Wed Fri  . metoprolol succinate  50 mg Oral Daily  . [START ON 08/07/2019] pantoprazole  40 mg Oral Daily  . zinc sulfate  220 mg Oral Daily   sodium chloride, sodium chloride, sodium chloride, acetaminophen, guaiFENesin-dextromethorphan, heparin, heparin, lidocaine-prilocaine, loperamide, magnesium hydroxide, ondansetron **OR** ondansetron (ZOFRAN) IV, pentafluoroprop-tetrafluoroeth, traZODone  Assessment/ Plan:  69 y.o. 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  ESRD on HD MWF but temporarily on TTS.  Patient likely to be transitioning to cohort dialysis unit.  Therefore she will need TTS dialysis seat.   Seen and examined on hemodialysis treatment.    2.  Anemia of chronic kidney disease.   -  EPO with HD treatment   3.  Secondary hyperparathyroidism: - calcium acetate with meals.   4. Hypertension:   - metoprolol dose decreased - holding torsemide, losartan and amlodipine.    LOS: 7 Cynthia Dean 1/12/20215:01 PM

## 2019-08-06 NOTE — Progress Notes (Signed)
   08/06/19 1645  Neurological  Level of Consciousness Alert  Orientation Level Oriented to person;Oriented to situation;Oriented to place  Respiratory  Respiratory Pattern Regular;Unlabored  Bilateral Breath Sounds Diminished  Cardiac  Pulse Regular  Heart Sounds S1, S2  stable for d/c no c/os tolerated tx well ufg 1.5L

## 2019-08-07 DIAGNOSIS — G9340 Encephalopathy, unspecified: Secondary | ICD-10-CM

## 2019-08-07 DIAGNOSIS — J9621 Acute and chronic respiratory failure with hypoxia: Secondary | ICD-10-CM

## 2019-08-07 LAB — GLUCOSE, CAPILLARY
Glucose-Capillary: 185 mg/dL — ABNORMAL HIGH (ref 70–99)
Glucose-Capillary: 179 mg/dL — ABNORMAL HIGH (ref 70–99)
Glucose-Capillary: 195 mg/dL — ABNORMAL HIGH (ref 70–99)
Glucose-Capillary: 166 mg/dL — ABNORMAL HIGH (ref 70–99)

## 2019-08-07 LAB — VITAMIN B12: Vitamin B-12: 1048 pg/mL — ABNORMAL HIGH (ref 180–914)

## 2019-08-07 LAB — TSH: TSH: 0.386 u[IU]/mL (ref 0.350–4.500)

## 2019-08-07 NOTE — Consult Note (Signed)
Neurology Consultation Reason for Consult: Altered mental status Referring Physician: Leslye Peer, R  CC: AMS  History is obtained from: Patient, daughter  HPI: Cynthia Dean is a 69 y.o. female with a history of stroke, seizure who presented with hypoxia secondary to Covid pneumonia.  She has been confused and neurology is being consulted for her confusion.  I spoke with her daughter, who states that with any type of significant illness in the past few years she has gotten significantly confused.  She describes her mother with a previous illness calling her husband her son's name and frequently making simple errors such as this.  Yesterday, apparently she was not even able to tell people her own name, but overnight has had significant improvement.  Though she still is confused.  It is also noted that if her oxygen sats fall down into the 70s, her confusion significantly increases.   ROS: A 14 point ROS was performed and is negative except as noted in the HPI.  Past Medical History:  Diagnosis Date  . Carotid arterial disease ([place])    a. 02/2018 < bilat ICA stenosis.  [name] COPD (chronic obstructive pulmonary disease) ([place])   . Coronary artery disease    a. 11/2015 MV: EF 57%, no ischemia/infarct.  . Cryptogenic stroke ([place])    a. 10/2017 s/p implantable loop recorder (no Afib to date).  . Diabetes mellitus without complication ([place])   . ESRD on dialysis ([place])   . GI bleed    a. 01/2019 EGD/Colonoscopy: Mild gastritis. Colitis.  [name] Heart murmur    a. 12/2018 Echo: EF 55-60%, impaired relaxation. Mod dil LA. Mildly dil RA. Mild Ca2+ of AoV.  [name] Hyperlipidemia   . Hypertension      Family History  Problem Relation Age of Onset  . Breast cancer Father 68  . Stroke Mother   . Heart attack Mother   . Heart Problems Sister   . Breast cancer Cousin 65       1 st cousin. Maternal      Social History:  reports that she has quit smoking. Her smoking use included cigarettes. She smoked  0.00 packs per day. She has never used smokeless tobacco. She reports that she does not drink alcohol or use drugs.   Exam: Current vital signs: BP (!) 153/59 (BP Location: Right Arm)   Pulse 72   Temp 98.6 F (37 C) (Oral)   Resp 20   Ht 5\' 9"  (1.753 m)   Wt 133.5 kg   SpO2 92%   BMI 43.46 kg/m  Vital signs in last 24 hours: Temp:  [97.8 F (36.6 C)-99.1 F (37.3 C)] 98.6 F (37 C) (01/13 0441) Pulse Rate:  [66-78] 72 (01/13 0441) Resp:  [17-20] 20 (01/13 0441) BP: (100-171)/(59-79) 153/59 (01/13 0441) SpO2:  [87 %-98 %] 92 % (01/13 0441)   Physical Exam  Constitutional: Appears well-developed and well-nourished.  Psych: Affect appropriate to situation Eyes: No scleral injection HENT: No OP obstrucion MSK: no joint deformities.  Cardiovascular: Normal rate and regular rhythm.  Respiratory: Effort normal, non-labored breathing GI: Soft.  No distension. There is no tenderness.  Skin: WDI  Neuro: Mental Status: Patient is awake, alert, oriented to person, place, month, year She is unable to give number of quarters in $2.75. Cranial Nerves: II: Visual Fields are full. Pupils are equal, round, and reactive to light.   III,IV, VI: EOMI without ptosis or diploplia.  V: Facial sensation is symmetric to temperature VII: Facial movement is  symmetric.  VIII: hearing is intact to voice X: Uvula elevates symmetrically XI: Shoulder shrug is symmetric. XII: tongue is midline without atrophy or fasciculations.  Motor: Tone is normal. Bulk is normal. 5/5 strength was present in all four extremities.  Sensory: Sensation is symmetric to light touch and temperature in the arms and legs. Cerebellar: FNF intact bilaterally    I have reviewed labs in epic and the results pertinent to this consultation are: Elevated potassium of 5.7 BUN 144  I have reviewed the images obtained: MRI brain - negative  Impression: 69 year old female with a history of delirium with illness who  presents with hypoxia and Covid pneumonia with delirium.  Covid commonly causes encephalopathic states, and in someone already predisposed to delirium, it is not surprising that with her hypoxia and Covid she has become delirious.  With her improvement, I do not think any further testing at this time is likely to be of use, though it would be reasonable to rule out comorbid conditions that could be treated such as thyroid insufficiency or B12 deficiency.  Also, with a BUN of 144 I wonder if this could be playing a role in her mental status as well.   Recommendations: 1) TSH, B12, address as appropriate 2) if she becomes densely encephalopathic again, could consider EEG, but do not feel that it is likely be beneficial at this time. 3) continue Keppra at home dose 4) neurology will be available on an as-needed basis moving forward, please call with further questions or concerns.  Roland Rack, MD [facility] [phone]  If 7pm- 7am, please page neurology on call as listed in [place].

## 2019-08-07 NOTE — Progress Notes (Signed)
PROGRESS NOTE    Cynthia Dean  XVQ:[id number] DOB: 01/07/1951 DOA: 07/30/2019  PCP: Lowella Bandy, MD    LOS - 8   Brief Narrative:  69 y.o. female with a history of COPD, coronary artery disease, type 2 diabetes mellitus and end-stage renal disease on hemodialysis on Monday, Wednesday and Friday who apparently missed dialysis on Monday, and presented to the emergency room with acute onset of generalized weakness, fatigue and found to be hypoxic in the 70s on room air by EMS.  Reported associated dyspnea and productive cough x 5-7 days.  In the ED, hypertensive 165/68, O2 sat 89% on room air.  Labs were notable for hyperkalemia 7.3, BUN and Cr consistent with ESRD, BNP was 451 and troponin I 22.  LDH was 259 and ferritin 1360.  Procalcitonin was 1.03.  COVID-19 antigen test positive.  CXR revealed patchy opacities bilaterally that may reflect edema or atypical pneumonia.  Patient was treated in the ED with calcium gluconate, D50/insulin, one amp bicarb, IV Decadron, Lokelma and IV Lasix.  Admitted to hospitalist service for further management, with nephrology consulted for dialysis.   Subjective 1/13: Patient seen this AM awake sitting up in bed.  No acute events reported.  She can tell me her name, but had to ask first and last names separately.  Took her awhile to tell me where we are, but she did eventually state correctly.  Unable to tell me year, president.  She denies pain, fever/chills or other complaints.  Does seem to have issues with word-finding.  Neuro to see this AM.  Assessment & Plan:   Active Problems:   Essential hypertension   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)   Seizure (HCC)   Acute on chronic respiratory failure with hypoxia (HCC)   Acute on chronic diastolic CHF (congestive heart failure) (HCC)   Pneumonia due to COVID-19 virus   Diarrhea   Acute on chronic respiratory failure with hypoxia -likely multifactorial in nature  with fluid overload from missed dialysis pain COVID-19 pneumonia contributing.  Patient tells me she uses oxygen only at night at home.  Currently requiring 4-6 L/min --Supplemental oxygen to maintain pulse ox greater than 90%  Acute on chronic diastolic congestive heart failure -improved --Dialysis for fluid management  Multifocal pneumonia secondary to COVID-19 infection Remdesivir completed.  Steroids were discontinued secondary to altered mental status.  Also completed Rocephin and Zithromax for possible community-acquired pneumonia. -Continue vitamin C and zinc -Combivent inhaler --Monitor inflammatory markers  Acute metabolic encephalopathy -likely due to COVID-19 infection.  MRI brain negative.  Gabapentin has been discontinued.  No focal motor deficits.  Patient seems to have word finding difficulty.  Baseline cognition is unknown at this time, will discuss with family. --Neurology to see today  Diarrhea -improved with Imodium.  Suspect this was secondary to COVID-19 infection.  Anemia of chronic disease -  --Aspirin and Plavix were held by prior physician in case of bleeding given hemoglobin dropped to 8.9 --We will monitor another 24 hours and resume aspirin Plavix if no sign of bleeding and hemoglobin stable  Type 2 diabetes -last A1c 8.3% --Continue Levemir and short-acting insulin --Continue sliding scale NovoLog  Hyperkalemia -persistent secondary to end-stage renal disease --Dialysis to manage  Hyperlipidemia -continue atorvastatin  Seizure disorder -continue Keppra  Essential hypertension -continue low-dose Toprol.  Blood pressure medications have been reduced this admission  Generalized weakness -due to acute illness on top of chronic renal failure --PT eval  DVT prophylaxis: Heparin   Code Status: Full Code  Family Communication: None at bedside Disposition Plan: Pending further improvement primarily in mental status.  Neurology consult pending.  Plan for  discharge home with home health PT.   Consultants:   Nephrology  Neurology  Procedures:   Dialysis  Antimicrobials:   Rocephin and azithromycin -course completed   Objective: Vitals:   08/06/19 2152 08/06/19 2334 08/07/19 0441 08/07/19 1213  BP:   (!) 153/59 (!) 158/61  Pulse: 75  72 75  Resp:   20 18  Temp:   98.6 F (37 C) 97.6 F (36.4 C)  TempSrc:   Oral Oral  SpO2: 97% (!) 87% 92% 91%  Weight:      Height:        Intake/Output Summary (Last 24 hours) at 08/07/2019 1522 Last data filed at 08/07/2019 0830 Gross per 24 hour  Intake 60 ml  Output 1501 ml  Net -1441 ml   Filed Weights   08/01/19 1430 08/03/19 0037 08/03/19 0501  Weight: 105.7 kg 105.7 kg 133.5 kg    Examination:  General exam: awake, alert, no acute distress, obese HEENT: clear conjunctiva, anicteric sclera, moist mucus membranes, hearing grossly normal  Respiratory system: clear but diminished bilaterally, no wheezes, rales or rhonchi, normal respiratory effort.  4 L/min nasal cannula oxygen Cardiovascular system: normal S1/S2, RRR, no JVD, murmurs, rubs, gallops, no pedal edema.   Central nervous system: alert and oriented x2.  Delayed speech appears to be difficulty with word finding.  Upper and lower extremity motor equal and intact.  Cranial nerves grossly intact.  Exam limited by patient unable to follow all commands. Extremities: moves all, no edema, normal tone Skin: dry, intact, normal temperature,  Psychiatry: normal mood, congruent affect, unable to evaluate judgement and insight with altered mental status   Data Reviewed: I have personally reviewed following labs and imaging studies  CBC: Recent Labs  Lab 08/01/19 0715 08/02/19 0214 08/02/19 1327 08/03/19 0622 08/04/19 0517 08/05/19 0612 08/06/19 1226  WBC 8.7 7.0  --  7.2 6.7 9.2 10.0  NEUTROABS 6.8 4.9  --  5.1 5.0  --   --   HGB 10.8* 10.2* 10.8* 10.1* 9.2* 9.2* 8.9*  HCT 34.7* 32.9*  --  33.4* 29.6* 29.1* 28.6*   MCV 88.3 88.4  --  88.8 87.8 87.7 86.7  PLT 149* 126*  --  145* 165 187 409   Basic Metabolic Panel: Recent Labs  Lab 08/01/19 0409 08/02/19 0214 08/03/19 0622 08/04/19 0517 08/05/19 0612 08/06/19 1226  NA 144 140 142 140 141 136  K 6.2* 5.7* 6.0* 5.2* 5.3* 5.7*  CL 101 98 98 97* 98 94*  CO2 20* 22 19* 20* 22 19*  GLUCOSE 130* 160* 227* 255* 174* 255*  BUN 79* 71* 114* 87* 112* 144*  CREATININE 14.72* 11.84* 14.22* 11.11* 13.02* 15.11*  CALCIUM 8.6* 8.4* 9.2 8.7* 8.8* 8.6*  MG 2.0  --   --   --   --   --   PHOS  --   --   --   --   --  8.1*   GFR: Estimated Creatinine Clearance: 5.2 mL/min (A) (by C-G formula based on SCr of 15.11 mg/dL (H)). Liver Function Tests: Recent Labs  Lab 08/01/19 0409 08/01/19 0715 08/02/19 0214 08/03/19 0622 08/04/19 0517 08/06/19 1226  AST 27 28 75* 71* 53*  --   ALT 17 17 21 20 20   --   ALKPHOS 68 69 62 57  56  --   BILITOT 0.7 0.7 0.8 0.8 0.8  --   PROT 7.3 7.5 7.0 6.6 6.5  --   ALBUMIN 3.3* 3.3* 3.1* 2.9* 2.8* 2.8*   No results for input(s): LIPASE, AMYLASE in the last 168 hours. Recent Labs  Lab 08/01/19 0409  AMMONIA 14   Coagulation Profile: No results for input(s): INR, PROTIME in the last 168 hours. Cardiac Enzymes: No results for input(s): CKTOTAL, CKMB, CKMBINDEX, TROPONINI in the last 168 hours. BNP (last 3 results) No results for input(s): PROBNP in the last 8760 hours. HbA1C: No results for input(s): HGBA1C in the last 72 hours. CBG: Recent Labs  Lab 08/06/19 1212 08/06/19 1752 08/06/19 2140 08/07/19 0752 08/07/19 1214  GLUCAP 226* 198* 185* 185* 179*   Lipid Profile: No results for input(s): CHOL, HDL, LDLCALC, TRIG, CHOLHDL, LDLDIRECT in the last 72 hours. Thyroid Function Tests: Recent Labs    08/07/19 1212  TSH 0.386   Anemia Panel: No results for input(s): VITAMINB12, FOLATE, FERRITIN, TIBC, IRON, RETICCTPCT in the last 72 hours. Sepsis Labs: No results for input(s): PROCALCITON, LATICACIDVEN in  the last 168 hours.  Recent Results (from the past 240 hour(s))  Culture, blood (Routine X 2) w Reflex to ID Panel     Status: None   Collection Time: 07/31/19 12:27 AM   Specimen: BLOOD  Result Value Ref Range Status   Specimen Description BLOOD RIGHT WRIST  Final   Special Requests   Final    BOTTLES DRAWN AEROBIC AND ANAEROBIC Blood Culture adequate volume   Culture   Final    NO GROWTH 5 DAYS Performed at [facility], [address] [place], [place] [address]    Report Status 08/05/2019 FINAL  Final  Culture, blood (Routine X 2) w Reflex to ID Panel     Status: None   Collection Time: 07/31/19 12:44 AM   Specimen: BLOOD  Result Value Ref Range Status   Specimen Description BLOOD BLOOD RIGHT WRIST  Final   Special Requests   Final    BOTTLES DRAWN AEROBIC AND ANAEROBIC Blood Culture adequate volume   Culture   Final    NO GROWTH 5 DAYS Performed at [facility], [address] [place], [place] [address]    Report Status 08/05/2019 FINAL  Final  MRSA PCR Screening     Status: None   Collection Time: 08/01/19 11:12 PM   Specimen: Nasopharyngeal  Result Value Ref Range Status   MRSA by PCR NEGATIVE NEGATIVE Final    Comment:        The GeneXpert MRSA Assay (FDA approved for NASAL specimens only), is one component of a comprehensive MRSA colonization surveillance program. It is not intended to diagnose MRSA infection nor to guide or monitor treatment for MRSA infections. Performed at [facility], [address] [place], [place] [address]          Radiology Studies: No results found.      Scheduled Meds: . vitamin C  500 mg Oral Daily  . atorvastatin  80 mg Oral QPM  . calcium acetate  2,001 mg Oral TID WC  . cholecalciferol  1,000 Units Oral Daily  . epoetin (EPOGEN/PROCRIT) injection  10,000 Units Intravenous Q T,Th,Sa-HD  . furosemide  60 mg Intravenous Once  . guaiFENesin  600 mg Oral BID  . insulin aspart  0-5  Units Subcutaneous QHS  . insulin aspart  0-6 Units Subcutaneous TID WC  . insulin aspart  3  Units Subcutaneous TID WC  . insulin detemir  8 Units Subcutaneous BID  . Ipratropium-Albuterol  1 puff Inhalation Q6H  . levETIRAcetam  1,000 mg Oral Daily  . levETIRAcetam  250 mg Oral Once per day on Mon Wed Fri  . metoprolol succinate  50 mg Oral Daily  . pantoprazole  40 mg Oral Daily  . zinc sulfate  220 mg Oral Daily   Continuous Infusions: . sodium chloride    . sodium chloride    . sodium chloride Stopped (08/04/19 1200)     LOS: 8 days    Time spent: 35 minutes    Ezekiel Slocumb, DO [facility]   If 7PM-7AM, please contact night-coverage [email or web address] Password [facility] 08/07/2019, 3:22 PM

## 2019-08-07 NOTE — Progress Notes (Signed)
Physical Therapy Treatment Patient Details Name: Cynthia Dean MRN: [id number] DOB: July 10, 1951 Today's Date: 08/07/2019    History of Present Illness presented to ER secondary to generalized fatigue, weakness, hypoxia; admitted for management of acute respiratory failure related to multifocal PNA due to covid-19 virus.  Of note, patient with persistent hyperkalemia (down to 5.3 today); cleared by Dr. Leslye Peer for participation with therapy as tolerated.    PT Comments    Gradual improvement in gait distance and overall activity tolerance, but does remain limited by generalized fatigue.  Does require increased time, cuing to complete all tasks; however, able to do so without extensive physical assist and without overt buckling or LOB.  Gait distance characterized by very short, shuffling steps (R > L), mod forward trunk lean and reliance on RW; slow and effortful. Sats 87-88% on 5L supplemental O2, recovering to 89-90% with seated rest.    Follow Up Recommendations  Home health PT     Equipment Recommendations  Rolling walker with 5" wheels;3in1 (PT)    Recommendations for Other Services       Precautions / Restrictions Precautions Precautions: Fall Restrictions Weight Bearing Restrictions: No    Mobility  Bed Mobility Overal bed mobility: Needs Assistance Bed Mobility: Supine to Sit     Supine to sit: Min assist        Transfers Overall transfer level: Needs assistance Equipment used: Rolling walker (2 wheeled) Transfers: Sit to/from Stand Sit to Stand: Min assist         General transfer comment: increased time, mild use of momentum; does require UE support to complete; cuing for hand placement  Ambulation/Gait Ambulation/Gait assistance: Min assist Gait Distance (Feet): (5' x2) Assistive device: Rolling walker (2 wheeled)       General Gait Details: very short, shuffling steps (R > L), mod forward trunk lean and reliance on RW; slow and effortful,  distance limited by fatigue.  Sats 87-88% on 5L supplemental O2, recovering to 89-90% with seated rest.   Stairs             Wheelchair Mobility    Modified Rankin (Stroke Patients Only)       Balance Overall balance assessment: Needs assistance Sitting-balance support: No upper extremity supported;Feet supported Sitting balance-Leahy Scale: Good     Standing balance support: Bilateral upper extremity supported Standing balance-Leahy Scale: Fair                              Cognition Arousal/Alertness: Awake/alert Behavior During Therapy: WFL for tasks assessed/performed Overall Cognitive Status: Within Functional Limits for tasks assessed                                 General Comments: increased time/effort for verbal responses, but generally intact and appropriate; remains distractible by external environment      Exercises Other Exercises Other Exercises: Sit/stand from various surface heights-edge of bed, recliner-with RW, min assist.  Requires use of UEs for lift off, consistent cuing for hand placement. Noted with bowel incontince during initial transfer; dep assist for hygiene. Noted area of redness/skin breakdown to buttocks; CNA/RN informed/aware.    General Comments        Pertinent Vitals/Pain Pain Assessment: No/denies pain    Home Living  Prior Function            PT Goals (current goals can now be found in the care plan section) Acute Rehab PT Goals Patient Stated Goal: to return home; agreeable for OOB to chair PT Goal Formulation: With patient Time For Goal Achievement: 08/19/19 Potential to Achieve Goals: Good Progress towards PT goals: Progressing toward goals    Frequency    Min 2X/week      PT Plan Current plan remains appropriate    Co-evaluation              AM-PAC PT "6 Clicks" Mobility   Outcome Measure  Help needed turning from your back to your side  while in a flat bed without using bedrails?: A Little Help needed moving from lying on your back to sitting on the side of a flat bed without using bedrails?: A Little Help needed moving to and from a bed to a chair (including a wheelchair)?: A Little Help needed standing up from a chair using your arms (e.g., wheelchair or bedside chair)?: A Little Help needed to walk in hospital room?: A Little Help needed climbing 3-5 steps with a railing? : A Lot 6 Click Score: 17    End of Session Equipment Utilized During Treatment: Oxygen Activity Tolerance: Patient tolerated treatment well Patient left: in chair;with call bell/phone within reach;with chair alarm set Nurse Communication: Mobility status PT Visit Diagnosis: Muscle weakness (generalized) (M62.81);Difficulty in walking, not elsewhere classified (R26.2)     Time: 6948-5462 PT Time Calculation (min) (ACUTE ONLY): 33 min  Charges:  $Gait Training: 8-22 mins $Therapeutic Activity: 8-22 mins                     Kamil Hanigan H. Owens Shark, PT, DPT, NCS 08/07/19, 1:54 PM [phone]

## 2019-08-07 NOTE — Progress Notes (Signed)
[facility]  ROUNDING NOTE   Subjective:   Seated in chair.   Hemodialysis treatment yesterday. Tolerated treatment well. UF of 1574mL  Objective:  Vital signs in last 24 hours:  Temp:  [97.6 F (36.4 C)-99.1 F (37.3 C)] 97.6 F (36.4 C) (01/13 1213) Pulse Rate:  [66-78] 75 (01/13 1213) Resp:  [17-20] 18 (01/13 1213) BP: (100-158)/(59-79) 158/61 (01/13 1213) SpO2:  [87 %-98 %] 91 % (01/13 1213)  Weight change:  Filed Weights   08/01/19 1430 08/03/19 0037 08/03/19 0501  Weight: 105.7 kg 105.7 kg 133.5 kg    Intake/Output: I/O last 3 completed shifts: In: 49 [P.O.:60] Out: 1500 [Other:1500]   Intake/Output this shift:  Total I/O In: -  Out: 1 [Stool:1]  Physical Exam: General: NAD, seated in chair  Head: Normocephalic, atraumatic. Moist oral mucosal membranes  Eyes: Anicteric, PERRL  Neck: Supple, trachea midline  Lungs:  Clear to auscultation  Heart: Regular rate and rhythm  Abdomen:  Soft, nontender,   Extremities:  no peripheral edema.  Neurologic: Nonfocal, moving all four extremities  Skin: No lesions  Access: Left AVG    Basic Metabolic Panel: Recent Labs  Lab 08/01/19 0409 08/02/19 0214 08/03/19 0622 08/04/19 0517 08/05/19 0612 08/06/19 1226  NA 144 140 142 140 141 136  K 6.2* 5.7* 6.0* 5.2* 5.3* 5.7*  CL 101 98 98 97* 98 94*  CO2 20* 22 19* 20* 22 19*  GLUCOSE 130* 160* 227* 255* 174* 255*  BUN 79* 71* 114* 87* 112* 144*  CREATININE 14.72* 11.84* 14.22* 11.11* 13.02* 15.11*  CALCIUM 8.6* 8.4* 9.2 8.7* 8.8* 8.6*  MG 2.0  --   --   --   --   --   PHOS  --   --   --   --   --  8.1*    Liver Function Tests: Recent Labs  Lab 08/01/19 0409 08/01/19 0715 08/02/19 0214 08/03/19 0622 08/04/19 0517 08/06/19 1226  AST 27 28 75* 71* 53*  --   ALT 17 17 21 20 20   --   ALKPHOS 68 69 62 57 56  --   BILITOT 0.7 0.7 0.8 0.8 0.8  --   PROT 7.3 7.5 7.0 6.6 6.5  --   ALBUMIN 3.3* 3.3* 3.1* 2.9* 2.8* 2.8*   No results for  input(s): LIPASE, AMYLASE in the last 168 hours. Recent Labs  Lab 08/01/19 0409  AMMONIA 14    CBC: Recent Labs  Lab 08/01/19 0715 08/02/19 0214 08/02/19 1327 08/03/19 0622 08/04/19 0517 08/05/19 0612 08/06/19 1226  WBC 8.7 7.0  --  7.2 6.7 9.2 10.0  NEUTROABS 6.8 4.9  --  5.1 5.0  --   --   HGB 10.8* 10.2* 10.8* 10.1* 9.2* 9.2* 8.9*  HCT 34.7* 32.9*  --  33.4* 29.6* 29.1* 28.6*  MCV 88.3 88.4  --  88.8 87.8 87.7 86.7  PLT 149* 126*  --  145* 165 187 200    Cardiac Enzymes: No results for input(s): CKTOTAL, CKMB, CKMBINDEX, TROPONINI in the last 168 hours.  BNP: Invalid input(s): POCBNP  CBG: Recent Labs  Lab 08/06/19 1212 08/06/19 1752 08/06/19 2140 08/07/19 0752 08/07/19 1214  GLUCAP 226* 198* 185* 185* 179*    Microbiology: Results for orders placed or performed during the hospital encounter of 07/30/19  Culture, blood (Routine X 2) w Reflex to ID Panel     Status: None   Collection Time: 07/31/19 12:27 AM   Specimen: BLOOD  Result Value  Ref Range Status   Specimen Description BLOOD RIGHT WRIST  Final   Special Requests   Final    BOTTLES DRAWN AEROBIC AND ANAEROBIC Blood Culture adequate volume   Culture   Final    NO GROWTH 5 DAYS Performed at [facility], [address] [place], [place] [address]    Report Status 08/05/2019 FINAL  Final  Culture, blood (Routine X 2) w Reflex to ID Panel     Status: None   Collection Time: 07/31/19 12:44 AM   Specimen: BLOOD  Result Value Ref Range Status   Specimen Description BLOOD BLOOD RIGHT WRIST  Final   Special Requests   Final    BOTTLES DRAWN AEROBIC AND ANAEROBIC Blood Culture adequate volume   Culture   Final    NO GROWTH 5 DAYS Performed at [facility], [address] [place], [place] [address]    Report Status 08/05/2019 FINAL  Final  MRSA PCR Screening     Status: None   Collection Time: 08/01/19 11:12 PM   Specimen: Nasopharyngeal  Result Value Ref Range Status   MRSA  by PCR NEGATIVE NEGATIVE Final    Comment:        The GeneXpert MRSA Assay (FDA approved for NASAL specimens only), is one component of a comprehensive MRSA colonization surveillance program. It is not intended to diagnose MRSA infection nor to guide or monitor treatment for MRSA infections. Performed at [facility], [address] [place],  [address]     Coagulation Studies: No results for input(s): LABPROT, INR in the last 72 hours.  Urinalysis: No results for input(s): COLORURINE, LABSPEC, PHURINE, GLUCOSEU, HGBUR, BILIRUBINUR, KETONESUR, PROTEINUR, UROBILINOGEN, NITRITE, LEUKOCYTESUR in the last 72 hours.  Invalid input(s): APPERANCEUR    Imaging: No results found.   Medications:   . sodium chloride    . sodium chloride    . sodium chloride Stopped (08/04/19 1200)   . vitamin C  500 mg Oral Daily  . atorvastatin  80 mg Oral QPM  . calcium acetate  2,001 mg Oral TID WC  . cholecalciferol  1,000 Units Oral Daily  . epoetin (EPOGEN/PROCRIT) injection  10,000 Units Intravenous Q T,Th,Sa-HD  . furosemide  60 mg Intravenous Once  . guaiFENesin  600 mg Oral BID  . insulin aspart  0-5 Units Subcutaneous QHS  . insulin aspart  0-6 Units Subcutaneous TID WC  . insulin aspart  3 Units Subcutaneous TID WC  . insulin detemir  8 Units Subcutaneous BID  . Ipratropium-Albuterol  1 puff Inhalation Q6H  . levETIRAcetam  1,000 mg Oral Daily  . levETIRAcetam  250 mg Oral Once per day on Mon Wed Fri  . metoprolol succinate  50 mg Oral Daily  . pantoprazole  40 mg Oral Daily  . zinc sulfate  220 mg Oral Daily   sodium chloride, sodium chloride, sodium chloride, acetaminophen, guaiFENesin-dextromethorphan, heparin, heparin, lidocaine-prilocaine, loperamide, magnesium hydroxide, ondansetron **OR** ondansetron (ZOFRAN) IV, pentafluoroprop-tetrafluoroeth, traZODone  Assessment/ Plan:  69 y.o. 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  ESRD on HD MWF but temporarily on TTS.     2.  Anemia of chronic kidney disease.   -  EPO with HD treatment   3.  Secondary hyperparathyroidism: - calcium acetate with meals.   4. Hypertension:   - metoprolol dose decreased - holding torsemide, losartan and amlodipine.    LOS: 8 Ah Bott 1/13/20212:18 PM

## 2019-08-07 NOTE — TOC Initial Note (Signed)
Transition of Care [facility]) - Initial/Assessment Note    Patient Details  Name: Cynthia Dean MRN: [id number] Date of Birth: 25-Jan-1951  [facility] CM/SW Contact:    [name], RN Phone Number: 08/07/2019, 3:37 PM  Clinical Narrative:                 Patient admitted from home with acute respiratory failure. Covid positive  Assessment completed via phone with daughter [name], and [name].   Patient lives at home with fiance [name].  He is also covid positive.  [name] states that he plans to return to work 1/20, however if he needs to take off more time to care for the patient he will be able to.    PCP [name] Fiance states that he transports her to appointments  Pharmacy [facility] Denies issues obtaining medication.   PT has assessed patient and recommends home health PT.  Fiance and daughter in agreement and states they do not have a preference of agency.  Referral made to [facility] with [facility] up referral to [facility] with [employer] for O2, BSC, and RW  Expected Discharge Plan: [place]     Patient Goals and CMS Choice        Expected Discharge Plan and Services Expected Discharge Plan: [place]   Discharge Planning Services: CM Consult   Living arrangements for the past 2 months: Single Family Home                     Date DME Agency Contacted: 08/07/19 Time DME Agency Contacted: 6226 Representative spoke with at DME Agency: [name]: RN, PT, Nurse's Aide [name] Agency: [place] Date [place]: 08/07/19   Representative spoke with at [place]: [name]  Prior Living Arrangements/Services Living arrangements for the past 2 months: [place] with:: Significant Other Patient language and need for interpreter reviewed:: Yes Do you feel safe going back to the place where you live?: Yes      Need for Family Participation in Patient Care: Yes (Comment) Care  giver support system in place?: Yes (comment)   Criminal Activity/Legal Involvement Pertinent to Current Situation/Hospitalization: No - Comment as needed  Activities of Daily Living Home Assistive Devices/Equipment: Walker (specify type) ADL Screening (condition at time of admission) Patient's cognitive ability adequate to safely complete daily activities?: No Is the patient deaf or have difficulty hearing?: No Does the patient have difficulty seeing, even when wearing glasses/contacts?: No Does the patient have difficulty concentrating, remembering, or making decisions?: Yes Patient able to express need for assistance with ADLs?: No Does the patient have difficulty dressing or bathing?: Yes Independently performs ADLs?: No Does the patient have difficulty walking or climbing stairs?: Yes Weakness of Legs: Both Weakness of Arms/Hands: Both  Permission Sought/Granted                  Emotional Assessment       Orientation: : Oriented to Self, Oriented to Place, Oriented to  Time, Oriented to Situation   Psych Involvement: No (comment)  Admission diagnosis:  Acute respiratory failure (HCC) [J96.00] Hyperkalemia [E87.5] Weakness [R53.1] SOB (shortness of breath) [R06.02] Hypoxia [R09.02] Pneumonia due to COVID-19 virus [U07.1, J12.82] Patient Active Problem List   Diagnosis Date Noted  . Diarrhea   . Acute metabolic encephalopathy   . Acute on chronic diastolic CHF (congestive heart failure) ([place])   .  Pneumonia due to COVID-19 virus   . Hyperkalemia   . Acute on chronic respiratory failure with hypoxia ([place]) 07/30/2019  . Rectal bleed 02/13/2019  . Acute respiratory failure with hypoxia ([place]) 12/31/2018  . Nausea & vomiting 05/30/2018  . Nausea and vomiting 05/29/2018  . Seizure ([place]) 03/14/2018  . Stroke ([place]) 03/07/2018  . Slurred speech 11/14/2017  . 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[place]) 11/12/2017  . CAD (coronary artery disease)  11/12/2017  . COPD (chronic obstructive pulmonary disease) ([place]) 11/12/2017  . ESRD on dialysis ([place]) 11/12/2017  . Hematemesis 06/27/2017  . CVA (cerebral vascular accident) ([place]) 04/14/2016  . Essential hypertension 11/27/2015  . Hyperlipidemia 11/27/2015  . Obesity 11/27/2015  . Prolonged Q-T interval on ECG 11/26/2015  . Intractable nausea and vomiting 08/08/2015   PCP:  Lowella Bandy, MD Pharmacy:   [facility] [address] [place] [address] Phone: [phone] Fax: [phone]     Social Determinants of Health (SDOH) Interventions    Readmission Risk Interventions Readmission Risk Prevention Plan 08/07/2019 02/15/2019 01/02/2019  Transportation Screening Complete - Complete  PCP or Specialist Appt within 3-5 Days - - Complete  Palliative Care Screening - - Not Applicable  Medication Review (RN Care Manager) Complete Complete -  [place] or Home Care Consult Complete - -  Palliative Care Screening Not Applicable - -  [place] Not Applicable - -  Some recent data might be hidden

## 2019-08-08 LAB — CBC WITH DIFFERENTIAL/PLATELET
Abs Immature Granulocytes: 0.25 10*3/uL — ABNORMAL HIGH (ref 0.00–0.07)
Basophils Absolute: 0 10*3/uL (ref 0.0–0.1)
Basophils Relative: 0 %
Eosinophils Absolute: 0.1 10*3/uL (ref 0.0–0.5)
Eosinophils Relative: 1 %
HCT: 28.2 % — ABNORMAL LOW (ref 36.0–46.0)
Hemoglobin: 8.9 g/dL — ABNORMAL LOW (ref 12.0–15.0)
Immature Granulocytes: 2 %
Lymphocytes Relative: 16 %
Lymphs Abs: 1.9 10*3/uL (ref 0.7–4.0)
MCH: 27.4 pg (ref 26.0–34.0)
MCHC: 31.6 g/dL (ref 30.0–36.0)
MCV: 86.8 fL (ref 80.0–100.0)
Monocytes Absolute: 1.3 10*3/uL — ABNORMAL HIGH (ref 0.1–1.0)
Monocytes Relative: 11 %
Neutro Abs: 8.4 10*3/uL — ABNORMAL HIGH (ref 1.7–7.7)
Neutrophils Relative %: 70 %
Platelets: 246 10*3/uL (ref 150–400)
RBC: 3.25 MIL/uL — ABNORMAL LOW (ref 3.87–5.11)
RDW: 14.7 % (ref 11.5–15.5)
WBC: 12.1 10*3/uL — ABNORMAL HIGH (ref 4.0–10.5)
nRBC: 0 % (ref 0.0–0.2)

## 2019-08-08 LAB — BASIC METABOLIC PANEL
Anion gap: 17 — ABNORMAL HIGH (ref 5–15)
BUN: 101 mg/dL — ABNORMAL HIGH (ref 8–23)
CO2: 27 mmol/L (ref 22–32)
Calcium: 9 mg/dL (ref 8.9–10.3)
Chloride: 98 mmol/L (ref 98–111)
Creatinine, Ser: 12.28 mg/dL — ABNORMAL HIGH (ref 0.44–1.00)
GFR calc Af Amer: 3 mL/min — ABNORMAL LOW (ref >60)
GFR calc non Af Amer: 3 mL/min — ABNORMAL LOW (ref >60)
Glucose, Bld: 155 mg/dL — ABNORMAL HIGH (ref 70–99)
Potassium: 4.8 mmol/L (ref 3.5–5.1)
Sodium: 142 mmol/L (ref 135–145)

## 2019-08-08 LAB — MAGNESIUM: Magnesium: 2.2 mg/dL (ref 1.7–2.4)

## 2019-08-08 LAB — GLUCOSE, CAPILLARY
Glucose-Capillary: 158 mg/dL — ABNORMAL HIGH (ref 70–99)
Glucose-Capillary: 126 mg/dL — ABNORMAL HIGH (ref 70–99)
Glucose-Capillary: 161 mg/dL — ABNORMAL HIGH (ref 70–99)

## 2019-08-08 NOTE — Progress Notes (Signed)
[facility]  ROUNDING NOTE   Subjective:   Seen and examined on hemodialysis treatment today. Tolerated treatment.  More alert and talkative.  Objective:  Vital signs in last 24 hours:  Temp:  [98.4 F (36.9 C)-98.7 F (37.1 C)] 98.7 F (37.1 C) (01/14 1217) Pulse Rate:  [72-103] 76 (01/14 1217) Resp:  [17-20] 17 (01/14 1217) BP: (103-161)/(50-93) 155/57 (01/14 1217) SpO2:  [88 %-96 %] 92 % (01/14 1217)  Weight change:  Filed Weights   08/01/19 1430 08/03/19 0037 08/03/19 0501  Weight: 105.7 kg 105.7 kg 133.5 kg    Intake/Output: I/O last 3 completed shifts: In: 180 [P.O.:180] Out: 3 [Urine:2; Stool:1]   Intake/Output this shift:  No intake/output data recorded.  Physical Exam: General: NAD, laying in bed  Head: Normocephalic, atraumatic. Moist oral mucosal membranes  Eyes: Anicteric, PERRL  Neck: Supple, trachea midline  Lungs:  Clear to auscultation  Heart: Regular rate and rhythm  Abdomen:  Soft, nontender,   Extremities:  no peripheral edema.  Neurologic: Nonfocal, moving all four extremities  Skin: No lesions  Access: Left AVG    Basic Metabolic Panel: Recent Labs  Lab 08/03/19 0622 08/03/19 0622 08/04/19 0517 08/04/19 0517 08/05/19 0612 08/06/19 1226 08/08/19 0512  NA 142  --  140  --  141 136 142  K 6.0*  --  5.2*  --  5.3* 5.7* 4.8  CL 98  --  97*  --  98 94* 98  CO2 19*  --  20*  --  22 19* 27  GLUCOSE 227*  --  255*  --  174* 255* 155*  BUN 114*  --  87*  --  112* 144* 101*  CREATININE 14.22*  --  11.11*  --  13.02* 15.11* 12.28*  CALCIUM 9.2   < > 8.7*   < > 8.8* 8.6* 9.0  MG  --   --   --   --   --   --  2.2  PHOS  --   --   --   --   --  8.1*  --    < > = values in this interval not displayed.    Liver Function Tests: Recent Labs  Lab 08/02/19 0214 08/03/19 0622 08/04/19 0517 08/06/19 1226  AST 75* 71* 53*  --   ALT 21 20 20   --   ALKPHOS 62 57 56  --   BILITOT 0.8 0.8 0.8  --   PROT 7.0 6.6 6.5  --   ALBUMIN  3.1* 2.9* 2.8* 2.8*   No results for input(s): LIPASE, AMYLASE in the last 168 hours. No results for input(s): AMMONIA in the last 168 hours.  CBC: Recent Labs  Lab 08/02/19 0214 08/02/19 1327 08/03/19 0622 08/04/19 0517 08/05/19 0612 08/06/19 1226 08/08/19 0512  WBC 7.0  --  7.2 6.7 9.2 10.0 12.1*  NEUTROABS 4.9  --  5.1 5.0  --   --  8.4*  HGB 10.2*   < > 10.1* 9.2* 9.2* 8.9* 8.9*  HCT 32.9*  --  33.4* 29.6* 29.1* 28.6* 28.2*  MCV 88.4  --  88.8 87.8 87.7 86.7 86.8  PLT 126*  --  145* 165 187 200 246   < > = values in this interval not displayed.    Cardiac Enzymes: No results for input(s): CKTOTAL, CKMB, CKMBINDEX, TROPONINI in the last 168 hours.  BNP: Invalid input(s): POCBNP  CBG: Recent Labs  Lab 08/07/19 1214 08/07/19 1657 08/07/19 2044 08/08/19 1202 08/08/19  [place]    Microbiology: Results for orders placed or performed during the hospital encounter of 07/30/19  Culture, blood (Routine X 2) w Reflex to ID Panel     Status: None   Collection Time: 07/31/19 12:27 AM   Specimen: BLOOD  Result Value Ref Range Status   Specimen Description BLOOD RIGHT WRIST  Final   Special Requests   Final    BOTTLES DRAWN AEROBIC AND ANAEROBIC Blood Culture adequate volume   Culture   Final    NO GROWTH 5 DAYS Performed at [facility], [address] [place], [place] [address]    Report Status 08/05/2019 FINAL  Final  Culture, blood (Routine X 2) w Reflex to ID Panel     Status: None   Collection Time: 07/31/19 12:44 AM   Specimen: BLOOD  Result Value Ref Range Status   Specimen Description BLOOD BLOOD RIGHT WRIST  Final   Special Requests   Final    BOTTLES DRAWN AEROBIC AND ANAEROBIC Blood Culture adequate volume   Culture   Final    NO GROWTH 5 DAYS Performed at [facility], [address] [place], [place] [address]    Report Status 08/05/2019 FINAL  Final  MRSA PCR Screening     Status: None    Collection Time: 08/01/19 11:12 PM   Specimen: Nasopharyngeal  Result Value Ref Range Status   MRSA by PCR NEGATIVE NEGATIVE Final    Comment:        The GeneXpert MRSA Assay (FDA approved for NASAL specimens only), is one component of a comprehensive MRSA colonization surveillance program. It is not intended to diagnose MRSA infection nor to guide or monitor treatment for MRSA infections. Performed at [facility], [address] [place], [place] [address]     Coagulation Studies: No results for input(s): LABPROT, INR in the last 72 hours.  Urinalysis: No results for input(s): COLORURINE, LABSPEC, PHURINE, GLUCOSEU, HGBUR, BILIRUBINUR, KETONESUR, PROTEINUR, UROBILINOGEN, NITRITE, LEUKOCYTESUR in the last 72 hours.  Invalid input(s): APPERANCEUR    Imaging: No results found.   Medications:   . sodium chloride    . sodium chloride    . sodium chloride Stopped (08/04/19 1200)   . vitamin C  500 mg Oral Daily  . atorvastatin  80 mg Oral QPM  . calcium acetate  2,001 mg Oral TID WC  . cholecalciferol  1,000 Units Oral Daily  . epoetin (EPOGEN/PROCRIT) injection  10,000 Units Intravenous Q T,Th,Sa-HD  . furosemide  60 mg Intravenous Once  . guaiFENesin  600 mg Oral BID  . insulin aspart  0-5 Units Subcutaneous QHS  . insulin aspart  0-6 Units Subcutaneous TID WC  . insulin aspart  3 Units Subcutaneous TID WC  . insulin detemir  8 Units Subcutaneous BID  . Ipratropium-Albuterol  1 puff Inhalation Q6H  . levETIRAcetam  1,000 mg Oral Daily  . levETIRAcetam  250 mg Oral Once per day on Mon Wed Fri  . metoprolol succinate  50 mg Oral Daily  . pantoprazole  40 mg Oral Daily  . zinc sulfate  220 mg Oral Daily   sodium chloride, sodium chloride, sodium chloride, acetaminophen, guaiFENesin-dextromethorphan, heparin, heparin, lidocaine-prilocaine, loperamide, magnesium hydroxide, ondansetron **OR** ondansetron (ZOFRAN) IV, pentafluoroprop-tetrafluoroeth,  traZODone  Assessment/ Plan:  69 y.o. 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  ESRD on HD MWF but temporarily on TTS.   Seen and examined on hemodialysis treatment.    2.  Anemia of chronic kidney disease.   -  EPO with HD treatment   3.  Secondary hyperparathyroidism: - calcium acetate with meals.   4. Hypertension: well controlled during dialysis treatment.  - metoprolol dose decreased - holding torsemide, losartan and amlodipine.    LOS: 9 Lileigh Fahringer 1/14/20215:59 PM

## 2019-08-08 NOTE — Progress Notes (Signed)
Pre HD Assessment    08/08/19 0700  Neurological  Level of Consciousness Alert  Orientation Level Oriented to person;Disoriented to place;Disoriented to time;Disoriented to situation  Respiratory  Respiratory Pattern Dyspnea with exertion  Chest Assessment Chest expansion symmetrical  Bilateral Breath Sounds Diminished;Rhonchi  Cough Non-productive;Congested  Cardiac  Pulse Regular  Heart Sounds S1, S2  ECG Monitor Yes  Cardiac Rhythm NSR  Vascular  R Radial Pulse +2  L Radial Pulse +2  Edema Generalized  Psychosocial  Psychosocial (WDL) X  Patient Behaviors Calm;Cooperative  Needs Expressed Physical  Emotional support given Given to patient

## 2019-08-08 NOTE — Progress Notes (Addendum)
PROGRESS NOTE    Cynthia Dean  RWE:[id number] DOB: [birth year] DOA: 07/30/2019  PCP: Lowella Bandy, MD    LOS - 9   Brief Narrative:  69 y.o.femalewith a history of COPD, coronary artery disease, type 2 diabetes mellitus and end-stage renal disease on hemodialysis on Monday, Wednesday and Friday who apparently missed dialysis on Monday, and presented to the emergency room with acute onset of generalized weakness, fatigue and found to be hypoxic in the 70s on room air by EMS.Reported associated dyspnea and productive cough x 5-7 days.  In the ED, hypertensive 165/68, O2 sat 89% on room air.  Labs were notable for hyperkalemia 7.3, BUN and Cr consistent with ESRD, BNP was 451 and troponin I 22. LDH was 259 and ferritin 1360. Procalcitonin was 1.03. COVID-19 antigen test positive. CXR revealed patchy opacities bilaterally that may reflect edema or atypical pneumonia.  Patient was treated in the ED with calcium gluconate, D50/insulin, one amp bicarb, IV Decadron, Lokelma and IV Lasix.  Admitted to hospitalist service for further management, with nephrology consulted for dialysis.   Subjective 1/14: Patient seen today around lunch time.  Her meal tray in front of her with very few bites taken.  Asked if she needed help and she said no.  She is tearful today, says she just wants to go home.  She is speaking much better today, but still has difficulty finding words and gets visibly frustrated.  Denies pain, SOB, chest pain, fever/chills or other complaints.  No acute events reported.  Assessment & Plan:   Active Problems:   Essential hypertension   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)   Seizure (HCC)   Acute on chronic respiratory failure with hypoxia (HCC)   Acute on chronic diastolic CHF (congestive heart failure) (HCC)   Pneumonia due to COVID-19 virus   Diarrhea   Acute on chronic respiratory failure with hypoxia -likely multifactorial in  nature with fluid overload from missed dialysis pain COVID-19 pneumonia contributing.  Patient tells me she uses oxygen only at night at home.  Currently requiring 4-6 L/min --Supplemental oxygen to maintain pulse ox greater than 90%  Acute on chronic diastolic congestive heart failure -improved --Dialysis for fluid management  Multifocal pneumonia secondary to COVID-19 infection Remdesivir completed.  Steroids were discontinued secondary to altered mental status.  Also completed Rocephin and Zithromax for possible community-acquired pneumonia. -Continue vitamin C and zinc -Combivent inhaler --Monitor inflammatory markers  Acute metabolic encephalopathy -likely due to COVID-19 infection.  MRI brain negative.  Gabapentin has been discontinued.  TSH and B12 unremarkable.  No focal motor deficits.  Patient seems to have word finding difficulty.  Baseline cognition is unknown at this time, will discuss with family.   Patient's fiance indicated that patient has intermittent word-finding difficulties at baseline. --Neurology consulted   Diarrhea -improved with Imodium.  Suspect this was secondary to COVID-19 infection.  Anemia of chronic disease -  --Aspirin and Plavix were held by prior physician in case of bleeding given hemoglobin dropped to 8.9 --We will monitor another 24 hours and resume aspirin Plavix if no sign of bleeding and hemoglobin stable  Type 2 diabetes -last A1c 8.3% --Continue Levemir and short-acting insulin --Continue sliding scale NovoLog  Hyperkalemia -persistent secondary to end-stage renal disease --Dialysis to manage  Hyperlipidemia -continue atorvastatin  Seizure disorder -continue Keppra  Essential hypertension -continue low-dose Toprol.  Blood pressure medications have been reduced this admission  Generalized weakness -due to acute illness on  top of chronic renal failure --PT eval  DVT prophylaxis: Heparin   Code Status: Full Code  Family  Communication: Fiance, Patrica Duel, updated by phone this afternoon Disposition Plan: Pending further improvement, mental status and wean down oxygen as able.  Plan for discharge home with home health PT.   Consultants:   Nephrology  Neurology  Procedures:   Dialysis  Antimicrobials:   Rocephin and azithromycin -course completed   Objective: Vitals:   08/08/19 0830 08/08/19 0845 08/08/19 0900 08/08/19 1217  BP: (!) 125/53 (!) 110/54 117/65 (!) 155/57  Pulse: 75 76 79 76  Resp:    17  Temp:    98.7 F (37.1 C)  TempSrc:    Oral  SpO2:    92%  Weight:      Height:        Intake/Output Summary (Last 24 hours) at 08/08/2019 1636 Last data filed at 08/08/2019 7673 Gross per 24 hour  Intake 120 ml  Output 1 ml  Net 119 ml   Filed Weights   08/01/19 1430 08/03/19 0037 08/03/19 0501  Weight: 105.7 kg 105.7 kg 133.5 kg    Examination:  General exam: awake, alert, no acute distress, obese HEENT: moist mucus membranes, hearing grossly normal  Respiratory system: decreased breath sounds, no wheezes, rales or rhonchi, normal respiratory effort. Cardiovascular system: normal S1/S2, RRR, no JVD, murmurs, rubs, gallops, no pedal edema.   Central nervous system: alert and oriented x3. no gross focal neurologic deficits, normal speech Extremities: moves all, no edema, normal tone Psychiatry: depressed mood, congruent affect, abnormal judgement and insight     Data Reviewed: I have personally reviewed following labs and imaging studies  CBC: Recent Labs  Lab 08/02/19 0214 08/02/19 1327 08/03/19 0622 08/04/19 0517 08/05/19 0612 08/06/19 1226 08/08/19 0512  WBC 7.0  --  7.2 6.7 9.2 10.0 12.1*  NEUTROABS 4.9  --  5.1 5.0  --   --  8.4*  HGB 10.2*   < > 10.1* 9.2* 9.2* 8.9* 8.9*  HCT 32.9*  --  33.4* 29.6* 29.1* 28.6* 28.2*  MCV 88.4  --  88.8 87.8 87.7 86.7 86.8  PLT 126*  --  145* 165 187 200 246   < > = values in this interval not displayed.   Basic  Metabolic Panel: Recent Labs  Lab 08/03/19 0622 08/04/19 0517 08/05/19 0612 08/06/19 1226 08/08/19 0512  NA 142 140 141 136 142  K 6.0* 5.2* 5.3* 5.7* 4.8  CL 98 97* 98 94* 98  CO2 19* 20* 22 19* 27  GLUCOSE 227* 255* 174* 255* 155*  BUN 114* 87* 112* 144* 101*  CREATININE 14.22* 11.11* 13.02* 15.11* 12.28*  CALCIUM 9.2 8.7* 8.8* 8.6* 9.0  MG  --   --   --   --  2.2  PHOS  --   --   --  8.1*  --    GFR: Estimated Creatinine Clearance: 6.4 mL/min (A) (by C-G formula based on SCr of 12.28 mg/dL (H)). Liver Function Tests: Recent Labs  Lab 08/02/19 0214 08/03/19 0622 08/04/19 0517 08/06/19 1226  AST 75* 71* 53*  --   ALT 21 20 20   --   ALKPHOS 62 57 56  --   BILITOT 0.8 0.8 0.8  --   PROT 7.0 6.6 6.5  --   ALBUMIN 3.1* 2.9* 2.8* 2.8*   No results for input(s): LIPASE, AMYLASE in the last 168 hours. No results for input(s): AMMONIA in the last 168 hours. Coagulation  Profile: No results for input(s): INR, PROTIME in the last 168 hours. Cardiac Enzymes: No results for input(s): CKTOTAL, CKMB, CKMBINDEX, TROPONINI in the last 168 hours. BNP (last 3 results) No results for input(s): PROBNP in the last 8760 hours. HbA1C: No results for input(s): HGBA1C in the last 72 hours. CBG: Recent Labs  Lab 08/07/19 0752 08/07/19 1214 08/07/19 1657 08/07/19 2044 08/08/19 1202  GLUCAP 185* 179* 195* 166* 158*   Lipid Profile: No results for input(s): CHOL, HDL, LDLCALC, TRIG, CHOLHDL, LDLDIRECT in the last 72 hours. Thyroid Function Tests: Recent Labs    08/07/19 1212  TSH 0.386   Anemia Panel: Recent Labs    08/07/19 1212  VITAMINB12 1,048*   Sepsis Labs: No results for input(s): PROCALCITON, LATICACIDVEN in the last 168 hours.  Recent Results (from the past 240 hour(s))  Culture, blood (Routine X 2) w Reflex to ID Panel     Status: None   Collection Time: 07/31/19 12:27 AM   Specimen: BLOOD  Result Value Ref Range Status   Specimen Description BLOOD RIGHT  WRIST  Final   Special Requests   Final    BOTTLES DRAWN AEROBIC AND ANAEROBIC Blood Culture adequate volume   Culture   Final    NO GROWTH 5 DAYS Performed at [facility], [address] [place], [place] [address]    Report Status 08/05/2019 FINAL  Final  Culture, blood (Routine X 2) w Reflex to ID Panel     Status: None   Collection Time: 07/31/19 12:44 AM   Specimen: BLOOD  Result Value Ref Range Status   Specimen Description BLOOD BLOOD RIGHT WRIST  Final   Special Requests   Final    BOTTLES DRAWN AEROBIC AND ANAEROBIC Blood Culture adequate volume   Culture   Final    NO GROWTH 5 DAYS Performed at [facility], [address] [place], [place] [address]    Report Status 08/05/2019 FINAL  Final  MRSA PCR Screening     Status: None   Collection Time: 08/01/19 11:12 PM   Specimen: Nasopharyngeal  Result Value Ref Range Status   MRSA by PCR NEGATIVE NEGATIVE Final    Comment:        The GeneXpert MRSA Assay (FDA approved for NASAL specimens only), is one component of a comprehensive MRSA colonization surveillance program. It is not intended to diagnose MRSA infection nor to guide or monitor treatment for MRSA infections. Performed at [facility], [address] [place], [place] [address]          Radiology Studies: No results found.      Scheduled Meds: . vitamin C  500 mg Oral Daily  . atorvastatin  80 mg Oral QPM  . calcium acetate  2,001 mg Oral TID WC  . cholecalciferol  1,000 Units Oral Daily  . epoetin (EPOGEN/PROCRIT) injection  10,000 Units Intravenous Q T,Th,Sa-HD  . furosemide  60 mg Intravenous Once  . guaiFENesin  600 mg Oral BID  . insulin aspart  0-5 Units Subcutaneous QHS  . insulin aspart  0-6 Units Subcutaneous TID WC  . insulin aspart  3 Units Subcutaneous TID WC  . insulin detemir  8 Units Subcutaneous BID  . Ipratropium-Albuterol  1 puff Inhalation Q6H  . levETIRAcetam  1,000 mg Oral Daily  .  levETIRAcetam  250 mg Oral Once per day on Mon Wed Fri  . metoprolol succinate  50 mg Oral Daily  . pantoprazole  40 mg Oral Daily  .  zinc sulfate  220 mg Oral Daily   Continuous Infusions: . sodium chloride    . sodium chloride    . sodium chloride Stopped (08/04/19 1200)     LOS: 9 days    Time spent: 30-35 minutes    Ezekiel Slocumb, DO [facility]   If 7PM-7AM, please contact night-coverage [email or web address] Password [facility] 08/08/2019, 4:36 PM

## 2019-08-08 NOTE — Progress Notes (Signed)
HD TX started    08/08/19 0715  Vital Signs  Pulse Rate 83  Pulse Rate Source Monitor  Resp 19  BP (!) 149/61  BP Location Right Arm  Oxygen Therapy  SpO2 96 %  O2 Device Nasal Cannula  O2 Flow Rate (L/min) 6 L/min  Pulse Oximetry Type Continuous  Pain Assessment  Pain Scale 0-10  Pain Score 0  During Hemodialysis Assessment  Blood Flow Rate (mL/min) 400 mL/min  Arterial Pressure (mmHg) -220 mmHg  Venous Pressure (mmHg) 200 mmHg  Transmembrane Pressure (mmHg) 40 mmHg  Ultrafiltration Rate (mL/min) 760 mL/min  Dialysate Flow Rate (mL/min) 600 ml/min  Conductivity: Machine  14.5  HD Safety Checks Performed Yes  Dialysis Fluid Bolus Normal Saline  Bolus Amount (mL) 250 mL  Intra-Hemodialysis Comments Tx initiated  Fistula / Graft Left Forearm Arteriovenous fistula  No Placement Date or Time found.   Orientation: Left  Access Location: Forearm  Access Type: Arteriovenous fistula  Site Condition No complications  Status Accessed  Needle Size 15g  Drainage Description None

## 2019-08-08 NOTE — Progress Notes (Signed)
Pre HD Tx    08/08/19 0700  Hand-Off documentation  Report given to (Full Name) Beatris Ship, RN   Report received from (Full Name) Jacqulynn Cadet, RN   Vital Signs  Temp 98.4 F (36.9 C)  Temp Source Oral  Pulse Rate 87  Pulse Rate Source Monitor  Resp 19  BP (!) 161/56  BP Location Right Arm  BP Method Automatic  Patient Position (if appropriate) Lying  Oxygen Therapy  SpO2 96 %  O2 Device Nasal Cannula  O2 Flow Rate (L/min) 6 L/min  Pulse Oximetry Type Continuous  Pain Assessment  Pain Scale 0-10  Pain Score 0  Time-Out for Hemodialysis  What Procedure? HD  Pt Identifiers(min of two) First/Last Name;MRN/Account#  Correct Site? Yes  Correct Side? Yes  Correct Procedure? Yes  Consents Verified? Yes  Rad Studies Available? N/A  Safety Precautions Reviewed? Yes  Engineer, civil (consulting) Number 4  Station Number 121 (A)  UF/Alarm Test Passed  Conductivity: Meter 13.8  Conductivity: Machine  13.8  pH 7  Reverse Osmosis WRO1  Normal Saline Lot Number [id number]  Dialyzer Lot Number [id number]  Disposable Set Lot Number [id number]  Machine Temperature 98.6 F (37 C)  Musician and Audible Yes  Blood Lines Intact and Secured Yes  Pre Treatment Patient Checks  Vascular access used during treatment Graft  Hepatitis B Surface Antigen Results Negative  Date Hepatitis B Surface Antigen Drawn 12/31/18  Isolation Initiated Yes  Hepatitis B Surface Antibody  (>10)  Date Hepatitis B Surface Antibody Drawn 12/31/18  Hemodialysis Standing Orders Initiated Yes  ECG (Telemetry) Monitor On Yes  Prime Ordered Normal Saline  Length of  DialysisTreatment -hour(s) 2.5 Hour(s)  Dialysis Treatment Comments Na 140  Dialyzer Elisio 17H NR  Dialysate 2K;2.5 Ca  Dialysis Anticoagulant None  Dialysate Flow Ordered 600  Blood Flow Rate Ordered 400 mL/min  Ultrafiltration Goal 2 Liters  Dialysis Blood Pressure Support Ordered Normal Saline  Education / Care Plan  Dialysis Education  Provided No (Comment) (Confusion)  Fistula / Graft Left Forearm Arteriovenous fistula  No Placement Date or Time found.   Orientation: Left  Access Location: Forearm  Access Type: Arteriovenous fistula  Site Condition No complications  Fistula / Graft Assessment Present;Thrill;Bruit  Status Patent  Drainage Description None

## 2019-08-08 NOTE — Care Management Important Message (Signed)
Important Message  Patient Details  Name: Cynthia Dean MRN: [id number] Date of Birth: Nov 20, 1950   Medicare Important Message Given:  Yes - Important Message mailed due to current National Emergency  Reviewed verbally with fiance, Patrica Duel.  Requested copy of Medicare IM be mailed to patient's address on file.    [name] 08/08/2019, 3:00 PM

## 2019-08-09 DIAGNOSIS — I10 Essential (primary) hypertension: Secondary | ICD-10-CM

## 2019-08-09 LAB — COMPREHENSIVE METABOLIC PANEL
ALT: 11 U/L (ref 0–44)
AST: 13 U/L — ABNORMAL LOW (ref 15–41)
Albumin: 2.6 g/dL — ABNORMAL LOW (ref 3.5–5.0)
Alkaline Phosphatase: 55 U/L (ref 38–126)
Anion gap: 14 (ref 5–15)
BUN: 71 mg/dL — ABNORMAL HIGH (ref 8–23)
CO2: 25 mmol/L (ref 22–32)
Calcium: 8.5 mg/dL — ABNORMAL LOW (ref 8.9–10.3)
Chloride: 101 mmol/L (ref 98–111)
Creatinine, Ser: 10.34 mg/dL — ABNORMAL HIGH (ref 0.44–1.00)
GFR calc Af Amer: 4 mL/min — ABNORMAL LOW (ref >60)
GFR calc non Af Amer: 3 mL/min — ABNORMAL LOW (ref >60)
Glucose, Bld: 171 mg/dL — ABNORMAL HIGH (ref 70–99)
Potassium: 5 mmol/L (ref 3.5–5.1)
Sodium: 140 mmol/L (ref 135–145)
Total Bilirubin: 1.3 mg/dL — ABNORMAL HIGH (ref 0.3–1.2)
Total Protein: 6.5 g/dL (ref 6.5–8.1)

## 2019-08-09 LAB — GLUCOSE, CAPILLARY
Glucose-Capillary: 164 mg/dL — ABNORMAL HIGH (ref 70–99)
Glucose-Capillary: 172 mg/dL — ABNORMAL HIGH (ref 70–99)
Glucose-Capillary: 190 mg/dL — ABNORMAL HIGH (ref 70–99)
Glucose-Capillary: 175 mg/dL — ABNORMAL HIGH (ref 70–99)

## 2019-08-09 LAB — CBC
HCT: 29.1 % — ABNORMAL LOW (ref 36.0–46.0)
Hemoglobin: 8.9 g/dL — ABNORMAL LOW (ref 12.0–15.0)
MCH: 27 pg (ref 26.0–34.0)
MCHC: 30.6 g/dL (ref 30.0–36.0)
MCV: 88.2 fL (ref 80.0–100.0)
Platelets: 241 10*3/uL (ref 150–400)
RBC: 3.3 MIL/uL — ABNORMAL LOW (ref 3.87–5.11)
RDW: 14.8 % (ref 11.5–15.5)
WBC: 11.1 10*3/uL — ABNORMAL HIGH (ref 4.0–10.5)
nRBC: 0 % (ref 0.0–0.2)

## 2019-08-09 MED ORDER — DEXAMETHASONE SODIUM PHOSPHATE 10 MG/ML IJ SOLN
6.0000 mg | INTRAMUSCULAR | Status: DC
  Administered 2019-08-10 – 2019-08-11 (×2): 6 mg via INTRAVENOUS
  Filled 2019-08-09 (×2): qty 1

## 2019-08-09 NOTE — Plan of Care (Signed)
Continuing with plan of care. 

## 2019-08-09 NOTE — Progress Notes (Signed)
PROGRESS NOTE    Cynthia Dean  VHQ:[id number] DOB: January 13, 1951 DOA: 07/30/2019  PCP: Lowella Bandy, MD    LOS - 10   Brief Narrative:  69 y.o.femalewith a history of COPD, coronary artery disease, type 2 diabetes mellitus and end-stage renal disease on hemodialysis on Monday, Wednesday and Friday who apparently missed dialysis on Monday, and presented to the emergency room with acute onset of generalized weakness, fatigue and found to be hypoxic in the 70s on room air by EMS.Reported associated dyspnea and productive cough x 5-7 days.  In the ED, hypertensive 165/68, O2 sat 89% on room air.  Labs were notable for hyperkalemia 7.3, BUN and Cr consistent with ESRD, BNP was 451 and troponin I 22. LDH was 259 and ferritin 1360. Procalcitonin was 1.03. COVID-19 antigen test positive. CXR revealed patchy opacities bilaterally that may reflect edema or atypical pneumonia.  Patient was treated in the ED with calcium gluconate, D50/insulin, one amp bicarb, IV Decadron, Lokelma and IV Lasix.  Admitted to hospitalist service for further management, with nephrology consulted for dialysis.   Subjective 1/14: Patient seen today around lunch time.  Her meal tray in front of her with very few bites taken.  Asked if she needed help and she said no.  She is tearful today, says she just wants to go home.  She is speaking much better today, but still has difficulty finding words and gets visibly frustrated.  Denies pain, SOB, chest pain, fever/chills or other complaints.  No acute events reported.  Assessment & Plan:   Active Problems:   Essential hypertension   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)   Seizure (HCC)   Acute on chronic respiratory failure with hypoxia (HCC)   Acute on chronic diastolic CHF (congestive heart failure) (HCC)   Pneumonia due to COVID-19 virus   Diarrhea   Acute on chronic respiratory failure with hypoxia -likely multifactorial in  nature with fluid overload from ESRD and COVID-19 pneumonia contributing.  Patient tells me she uses oxygen only at night at home.   Currently requiring 6 L/min --Supplemental oxygen to maintain pulse ox greater than 90% --will resume dexamethasone (stopped previously due to AMS)  Acute on chronic diastolic congestive heart failure -improved --Dialysis for fluid management  Multifocal pneumonia secondary to COVID-19 infection Remdesivir completed.  Steroids were discontinued secondary to altered mental status.  Also completed Rocephin and Zithromax for possible community-acquired pneumonia. -Continue vitamin C and zinc -Combivent inhaler --Monitor inflammatory markers  Acute metabolic encephalopathy -likely due to COVID-19 infection.  MRI brain negative.  Gabapentin has been discontinued.  TSH and B12 unremarkable.  No focal motor deficits.  Patient seems to have word finding difficulty.  Baseline cognition is unknown at this time, will discuss with family.   Patient's fiance indicated that patient has intermittent word-finding difficulties at baseline. --Neurology consulted   Diarrhea -improved with Imodium.  Suspect this was secondary to COVID-19 infection.  Anemia of chronic disease -  --Aspirin and Plavix were held by prior physician in case of bleeding given hemoglobin dropped to 8.9 --We will monitor another 24 hours and resume aspirin Plavix if no sign of bleeding and hemoglobin stable  Type 2 diabetes -last A1c 8.3% --Continue Levemir and short-acting insulin --Continue sliding scale NovoLog  Hyperkalemia -persistent secondary to end-stage renal disease --Dialysis to manage  Hyperlipidemia -continue atorvastatin  Seizure disorder -continue Keppra  Essential hypertension -continue low-dose Toprol.  Blood pressure medications have been reduced this admission  Generalized weakness -due to acute illness on top of chronic renal failure --PT eval  DVT prophylaxis:  Heparin   Code Status: Full Code  Family Communication: none at bedside Disposition Plan: Pending further improvement, mental status and wean down oxygen as able.  Plan for discharge home with home health PT.  Patient continues to require hospital care due to 6 L/min oxygen requirement.  Difficulty obtaining DME for home oxygen due to national shortages.  May require placement if unable to wean down to reasonable O2 rate.   Consultants:   Nephrology  Neurology  Procedures:   Dialysis  Antimicrobials:   Rocephin and azithromycin -course completed   Objective: Vitals:   08/09/19 1245 08/09/19 1300 08/09/19 1700 08/09/19 2140  BP:    (!) 159/67  Pulse:    76  Resp:    18  Temp:    98 F (36.7 C)  TempSrc:    Oral  SpO2: 95% 95% 91% 97%  Weight:      Height:       No intake or output data in the 24 hours ending 08/09/19 2351 Filed Weights   08/01/19 1430 08/03/19 0037 08/03/19 0501  Weight: 105.7 kg 105.7 kg 133.5 kg    Examination:  General exam: sleepy but arousable, alert, no acute distress, obese HEENT: moist mucus membranes, hearing grossly normal  Respiratory system: decreased breath sounds, no wheezes, rales or rhonchi, normal respiratory effort. Cardiovascular system: normal S1/S2, RRR, no JVD, murmurs, rubs, gallops, no pedal edema.   Central nervous system: alert and oriented x3. normal speech with less delay or word finding trouble today Extremities: moves all, no edema, normal tone Psychiatry: depressed mood, congruent affect, abnormal judgement and insight     Data Reviewed: I have personally reviewed following labs and imaging studies  CBC: Recent Labs  Lab 08/03/19 0622 08/03/19 0622 08/04/19 0517 08/05/19 0612 08/06/19 1226 08/08/19 0512 08/09/19 0520  WBC 7.2   < > 6.7 9.2 10.0 12.1* 11.1*  NEUTROABS 5.1  --  5.0  --   --  8.4*  --   HGB 10.1*   < > 9.2* 9.2* 8.9* 8.9* 8.9*  HCT 33.4*   < > 29.6* 29.1* 28.6* 28.2* 29.1*  MCV 88.8    < > 87.8 87.7 86.7 86.8 88.2  PLT 145*   < > 165 187 200 246 241   < > = values in this interval not displayed.   Basic Metabolic Panel: Recent Labs  Lab 08/04/19 0517 08/05/19 0612 08/06/19 1226 08/08/19 0512 08/09/19 0520  NA 140 141 136 142 140  K 5.2* 5.3* 5.7* 4.8 5.0  CL 97* 98 94* 98 101  CO2 20* 22 19* 27 25  GLUCOSE 255* 174* 255* 155* 171*  BUN 87* 112* 144* 101* 71*  CREATININE 11.11* 13.02* 15.11* 12.28* 10.34*  CALCIUM 8.7* 8.8* 8.6* 9.0 8.5*  MG  --   --   --  2.2  --   PHOS  --   --  8.1*  --   --    GFR: Estimated Creatinine Clearance: 7.7 mL/min (A) (by C-G formula based on SCr of 10.34 mg/dL (H)). Liver Function Tests: Recent Labs  Lab 08/03/19 0622 08/04/19 0517 08/06/19 1226 08/09/19 0520  AST 71* 53*  --  13*  ALT 20 20  --  11  ALKPHOS 57 56  --  55  BILITOT 0.8 0.8  --  1.3*  PROT 6.6 6.5  --  6.5  ALBUMIN 2.9*  2.8* 2.8* 2.6*   No results for input(s): LIPASE, AMYLASE in the last 168 hours. No results for input(s): AMMONIA in the last 168 hours. Coagulation Profile: No results for input(s): INR, PROTIME in the last 168 hours. Cardiac Enzymes: No results for input(s): CKTOTAL, CKMB, CKMBINDEX, TROPONINI in the last 168 hours. BNP (last 3 results) No results for input(s): PROBNP in the last 8760 hours. HbA1C: No results for input(s): HGBA1C in the last 72 hours. CBG: Recent Labs  Lab 08/08/19 2033 08/09/19 0746 08/09/19 1205 08/09/19 1638 08/09/19 2135  GLUCAP 161* 164* 172* 190* 175*   Lipid Profile: No results for input(s): CHOL, HDL, LDLCALC, TRIG, CHOLHDL, LDLDIRECT in the last 72 hours. Thyroid Function Tests: Recent Labs    08/07/19 1212  TSH 0.386   Anemia Panel: Recent Labs    08/07/19 1212  VITAMINB12 1,048*   Sepsis Labs: No results for input(s): PROCALCITON, LATICACIDVEN in the last 168 hours.  Recent Results (from the past 240 hour(s))  Culture, blood (Routine X 2) w Reflex to ID Panel     Status: None    Collection Time: 07/31/19 12:27 AM   Specimen: BLOOD  Result Value Ref Range Status   Specimen Description BLOOD RIGHT WRIST  Final   Special Requests   Final    BOTTLES DRAWN AEROBIC AND ANAEROBIC Blood Culture adequate volume   Culture   Final    NO GROWTH 5 DAYS Performed at [facility], [address] [place], [place] [address]    Report Status 08/05/2019 FINAL  Final  Culture, blood (Routine X 2) w Reflex to ID Panel     Status: None   Collection Time: 07/31/19 12:44 AM   Specimen: BLOOD  Result Value Ref Range Status   Specimen Description BLOOD BLOOD RIGHT WRIST  Final   Special Requests   Final    BOTTLES DRAWN AEROBIC AND ANAEROBIC Blood Culture adequate volume   Culture   Final    NO GROWTH 5 DAYS Performed at [facility], [address] [place], [place] [address]    Report Status 08/05/2019 FINAL  Final  MRSA PCR Screening     Status: None   Collection Time: 08/01/19 11:12 PM   Specimen: Nasopharyngeal  Result Value Ref Range Status   MRSA by PCR NEGATIVE NEGATIVE Final    Comment:        The GeneXpert MRSA Assay (FDA approved for NASAL specimens only), is one component of a comprehensive MRSA colonization surveillance program. It is not intended to diagnose MRSA infection nor to guide or monitor treatment for MRSA infections. Performed at [facility], [address] [place], [place] [address]          Radiology Studies: No results found.      Scheduled Meds: . vitamin C  500 mg Oral Daily  . atorvastatin  80 mg Oral QPM  . calcium acetate  2,001 mg Oral TID WC  . cholecalciferol  1,000 Units Oral Daily  . epoetin (EPOGEN/PROCRIT) injection  10,000 Units Intravenous Q T,Th,Sa-HD  . furosemide  60 mg Intravenous Once  . guaiFENesin  600 mg Oral BID  . insulin aspart  0-5 Units Subcutaneous QHS  . insulin aspart  0-6 Units Subcutaneous TID WC  . insulin aspart  3 Units Subcutaneous TID WC  . insulin detemir   8 Units Subcutaneous BID  . Ipratropium-Albuterol  1 puff Inhalation Q6H  . levETIRAcetam  1,000 mg Oral Daily  . levETIRAcetam  250 mg Oral Once per day on Mon Wed Fri  . metoprolol succinate  50 mg Oral Daily  . pantoprazole  40 mg Oral Daily  . zinc sulfate  220 mg Oral Daily   Continuous Infusions: . sodium chloride    . sodium chloride    . sodium chloride Stopped (08/04/19 1200)     LOS: 10 days    Time spent: 30-35 minutes    Ezekiel Slocumb, DO [facility]   If 7PM-7AM, please contact night-coverage [email or web address] Password [facility] 08/09/2019, 11:51 PM

## 2019-08-09 NOTE — Progress Notes (Signed)
Physical Therapy Treatment Patient Details Name: Cynthia Dean MRN: [id number] DOB: 07-Mar-1951 Today's Date: 08/09/2019    History of Present Illness presented to ER secondary to generalized fatigue, weakness, hypoxia; admitted for management of acute respiratory failure related to multifocal PNA due to covid-19 virus.  Of note, patient with persistent hyperkalemia (down to 5.3 today); cleared by Dr. Leslye Peer for participation with therapy as tolerated.    PT Comments    Upon arrival, RN in room assessing O2 sats. Pt currently at 6L of O2 and desats while at rest to 86%. Continuous pulse ox donned with RN staff increasing O2 to 8L to meet 90% sats. Pt agreeable to mobility attempts, however pt unable to follow short one step commands like "lift your leg". Once therapist assisted in demonstration, pt able to mimic there-ex, however does require min assist for completion. Pt also reports she wants coffee, however when asked how does she prefer her coffee, she is unable to answer. Due to increased confusion and increased O2 demand, deferred OOB mobility this date. Changing recommendations to SNF as she doesn't appear at baseline level. SaO2 on room air at rest = 86% (unable to tolerate RA- 6L of O2) SaO2 on room air while ambulating = NA     Follow Up Recommendations  SNF     Equipment Recommendations  Rolling walker with 5" wheels;3in1 (PT)    Recommendations for Other Services       Precautions / Restrictions Precautions Precautions: Fall Restrictions Weight Bearing Restrictions: No    Mobility  Bed Mobility               General bed mobility comments: unsafe due to O2 sats  Transfers                    Ambulation/Gait                 Stairs             Wheelchair Mobility    Modified Rankin (Stroke Patients Only)       Balance                                            Cognition Arousal/Alertness:  Awake/alert Behavior During Therapy: Flat affect Overall Cognitive Status: No family/caregiver present to determine baseline cognitive functioning                                 General Comments: delayed response to questions. Unable to state day or how she takes her coffee (after request for coffee). Unable to follow simple one step commands withough demonstration first      Exercises Other Exercises Other Exercises: Attempted supine ther-ex including SLR, heel slides, hip abd/add, and AP. 12 reps with min assist    General Comments        Pertinent Vitals/Pain Pain Assessment: No/denies pain    Home Living                      Prior Function            PT Goals (current goals can now be found in the care plan section) Acute Rehab PT Goals Patient Stated Goal: unable to state PT Goal Formulation: With patient Time For Goal Achievement: 08/19/19 Potential  to Achieve Goals: Good Progress towards PT goals: Progressing toward goals    Frequency    Min 2X/week      PT Plan Discharge plan needs to be updated    Co-evaluation              AM-PAC PT "6 Clicks" Mobility   Outcome Measure  Help needed turning from your back to your side while in a flat bed without using bedrails?: A Lot Help needed moving from lying on your back to sitting on the side of a flat bed without using bedrails?: A Lot Help needed moving to and from a bed to a chair (including a wheelchair)?: Total Help needed standing up from a chair using your arms (e.g., wheelchair or bedside chair)?: Total Help needed to walk in hospital room?: Total Help needed climbing 3-5 steps with a railing? : Total 6 Click Score: 8    End of Session Equipment Utilized During Treatment: Oxygen Activity Tolerance: Treatment limited secondary to medical complications (Comment) Patient left: in bed;with bed alarm set Nurse Communication: Mobility status PT Visit Diagnosis: Muscle  weakness (generalized) (M62.81);Difficulty in walking, not elsewhere classified (R26.2)     Time: 1031-5945 PT Time Calculation (min) (ACUTE ONLY): 25 min  Charges:  $Therapeutic Exercise: 23-37 mins                     Greggory Stallion, PT, DPT [phone]    Cynthia Dean 08/09/2019, 11:56 AM

## 2019-08-09 NOTE — Progress Notes (Signed)
[facility]  ROUNDING NOTE   Subjective:   Refused medications this morning. Confused.   Hemodialysis treatment yesterday. Tolerated treatment well.   Objective:  Vital signs in last 24 hours:  Temp:  [98.2 F (36.8 C)-98.7 F (37.1 C)] 98.2 F (36.8 C) (01/15 1208) Pulse Rate:  [71-80] 77 (01/15 1208) Resp:  [16-20] 16 (01/15 1208) BP: (138-145)/(44-59) 138/44 (01/15 1208) SpO2:  [91 %-99 %] 95 % (01/15 1208)  Weight change:  Filed Weights   08/01/19 1430 08/03/19 0037 08/03/19 0501  Weight: 105.7 kg 105.7 kg 133.5 kg    Intake/Output: I/O last 3 completed shifts: In: 120 [P.O.:120] Out: -    Intake/Output this shift:  No intake/output data recorded.  Physical Exam: No examination today for preservation of PPE due to COVID-19 pandemic  Basic Metabolic Panel: Recent Labs  Lab 08/04/19 0517 08/04/19 0517 08/05/19 0612 08/05/19 0612 08/06/19 1226 08/08/19 0512 08/09/19 0520  NA 140  --  141  --  136 142 140  K 5.2*  --  5.3*  --  5.7* 4.8 5.0  CL 97*  --  98  --  94* 98 101  CO2 20*  --  22  --  19* 27 25  GLUCOSE 255*  --  174*  --  255* 155* 171*  BUN 87*  --  112*  --  144* 101* 71*  CREATININE 11.11*  --  13.02*  --  15.11* 12.28* 10.34*  CALCIUM 8.7*   < > 8.8*   < > 8.6* 9.0 8.5*  MG  --   --   --   --   --  2.2  --   PHOS  --   --   --   --  8.1*  --   --    < > = values in this interval not displayed.    Liver Function Tests: Recent Labs  Lab 08/03/19 0622 08/04/19 0517 08/06/19 1226 08/09/19 0520  AST 71* 53*  --  13*  ALT 20 20  --  11  ALKPHOS 57 56  --  55  BILITOT 0.8 0.8  --  1.3*  PROT 6.6 6.5  --  6.5  ALBUMIN 2.9* 2.8* 2.8* 2.6*   No results for input(s): LIPASE, AMYLASE in the last 168 hours. No results for input(s): AMMONIA in the last 168 hours.  CBC: Recent Labs  Lab 08/03/19 0622 08/03/19 0622 08/04/19 0517 08/05/19 0612 08/06/19 1226 08/08/19 0512 08/09/19 0520  WBC 7.2   < > 6.7 9.2 10.0 12.1*  11.1*  NEUTROABS 5.1  --  5.0  --   --  8.4*  --   HGB 10.1*   < > 9.2* 9.2* 8.9* 8.9* 8.9*  HCT 33.4*   < > 29.6* 29.1* 28.6* 28.2* 29.1*  MCV 88.8   < > 87.8 87.7 86.7 86.8 88.2  PLT 145*   < > 165 187 200 246 241   < > = values in this interval not displayed.    Cardiac Enzymes: No results for input(s): CKTOTAL, CKMB, CKMBINDEX, TROPONINI in the last 168 hours.  BNP: Invalid input(s): POCBNP  CBG: Recent Labs  Lab 08/08/19 1202 08/08/19 1713 08/08/19 2033 08/09/19 0746 08/09/19 1205  GLUCAP 158* 126* 161* 164* 172*    Microbiology: Results for orders placed or performed during the hospital encounter of 07/30/19  Culture, blood (Routine X 2) w Reflex to ID Panel     Status: None   Collection Time: 07/31/19 12:27  AM   Specimen: BLOOD  Result Value Ref Range Status   Specimen Description BLOOD RIGHT WRIST  Final   Special Requests   Final    BOTTLES DRAWN AEROBIC AND ANAEROBIC Blood Culture adequate volume   Culture   Final    NO GROWTH 5 DAYS Performed at [facility], [address], [place], [place] [address]    Report Status 08/05/2019 FINAL  Final  Culture, blood (Routine X 2) w Reflex to ID Panel     Status: None   Collection Time: 07/31/19 12:44 AM   Specimen: BLOOD  Result Value Ref Range Status   Specimen Description BLOOD BLOOD RIGHT WRIST  Final   Special Requests   Final    BOTTLES DRAWN AEROBIC AND ANAEROBIC Blood Culture adequate volume   Culture   Final    NO GROWTH 5 DAYS Performed at [facility], [address] [place], [place] [address]    Report Status 08/05/2019 FINAL  Final  MRSA PCR Screening     Status: None   Collection Time: 08/01/19 11:12 PM   Specimen: Nasopharyngeal  Result Value Ref Range Status   MRSA by PCR NEGATIVE NEGATIVE Final    Comment:        The GeneXpert MRSA Assay (FDA approved for NASAL specimens only), is one component of a comprehensive MRSA colonization surveillance program. It is  not intended to diagnose MRSA infection nor to guide or monitor treatment for MRSA infections. Performed at [facility], [address] [place], [place] [address]     Coagulation Studies: No results for input(s): LABPROT, INR in the last 72 hours.  Urinalysis: No results for input(s): COLORURINE, LABSPEC, PHURINE, GLUCOSEU, HGBUR, BILIRUBINUR, KETONESUR, PROTEINUR, UROBILINOGEN, NITRITE, LEUKOCYTESUR in the last 72 hours.  Invalid input(s): APPERANCEUR    Imaging: No results found.   Medications:   . sodium chloride    . sodium chloride    . sodium chloride Stopped (08/04/19 1200)   . vitamin C  500 mg Oral Daily  . atorvastatin  80 mg Oral QPM  . calcium acetate  2,001 mg Oral TID WC  . cholecalciferol  1,000 Units Oral Daily  . epoetin (EPOGEN/PROCRIT) injection  10,000 Units Intravenous Q T,Th,Sa-HD  . furosemide  60 mg Intravenous Once  . guaiFENesin  600 mg Oral BID  . insulin aspart  0-5 Units Subcutaneous QHS  . insulin aspart  0-6 Units Subcutaneous TID WC  . insulin aspart  3 Units Subcutaneous TID WC  . insulin detemir  8 Units Subcutaneous BID  . Ipratropium-Albuterol  1 puff Inhalation Q6H  . levETIRAcetam  1,000 mg Oral Daily  . levETIRAcetam  250 mg Oral Once per day on Mon Wed Fri  . metoprolol succinate  50 mg Oral Daily  . pantoprazole  40 mg Oral Daily  . zinc sulfate  220 mg Oral Daily   sodium chloride, sodium chloride, sodium chloride, acetaminophen, guaiFENesin-dextromethorphan, heparin, heparin, lidocaine-prilocaine, loperamide, magnesium hydroxide, ondansetron **OR** ondansetron (ZOFRAN) IV, pentafluoroprop-tetrafluoroeth, traZODone  Assessment/ Plan:  69 y.o. black female withend-stage renal disease on hemodialysis, COPD, coronary artery disease, diabetes type 2, diabetic neuropathy, diabetic retinopathy, hyperlipidemia, hypertension, carpal tunnel left hand, cervical spine arthritis, history of stroke, diastolic heart failure,  esophagitis, morbid obesity, myelopathy of the cervical spinal cord with cervical radiculopathy, obstructive sleep apnea, admitted to [facility] on 07/30/2019 for Acute respiratory failure (HCC) [J96.00] Hyperkalemia [E87.5] Weakness [R53.1] SOB (shortness of breath) [R06.02] Hypoxia [R09.02] Pneumonia due to COVID-19  virus [U07.1, J12.82]  COVID-19 positive  [facility] Left AVG 134kg   1.  ESRD on HD MWF but temporarily on TTS due to COVID-19 positive.   2.  Anemia of chronic kidney disease.   -  EPO with HD treatment   3.  Secondary hyperparathyroidism: - calcium acetate with meals.   4. Hypertension: well controlled during dialysis treatment.  - metoprolol dose decreased - holding torsemide, losartan and amlodipine.    LOS: Owen 1/15/20212:41 PM

## 2019-08-09 NOTE — Progress Notes (Signed)
Unable to do bed side spirometry due to patient being COVID positive.  The machine has no filter or way of adapting a filter to it,  and therefore can not be used on this patient.

## 2019-08-10 LAB — RENAL FUNCTION PANEL
Albumin: 2.8 g/dL — ABNORMAL LOW (ref 3.5–5.0)
Anion gap: 18 — ABNORMAL HIGH (ref 5–15)
BUN: 89 mg/dL — ABNORMAL HIGH (ref 8–23)
CO2: 24 mmol/L (ref 22–32)
Calcium: 9.3 mg/dL (ref 8.9–10.3)
Chloride: 98 mmol/L (ref 98–111)
Creatinine, Ser: 12.8 mg/dL — ABNORMAL HIGH (ref 0.44–1.00)
GFR calc Af Amer: 3 mL/min — ABNORMAL LOW (ref >60)
GFR calc non Af Amer: 3 mL/min — ABNORMAL LOW (ref >60)
Glucose, Bld: 174 mg/dL — ABNORMAL HIGH (ref 70–99)
Phosphorus: 7.4 mg/dL — ABNORMAL HIGH (ref 2.5–4.6)
Potassium: 5.4 mmol/L — ABNORMAL HIGH (ref 3.5–5.1)
Sodium: 140 mmol/L (ref 135–145)

## 2019-08-10 LAB — CBC
HCT: 28.9 % — ABNORMAL LOW (ref 36.0–46.0)
Hemoglobin: 8.9 g/dL — ABNORMAL LOW (ref 12.0–15.0)
MCH: 27.3 pg (ref 26.0–34.0)
MCHC: 30.8 g/dL (ref 30.0–36.0)
MCV: 88.7 fL (ref 80.0–100.0)
Platelets: 286 10*3/uL (ref 150–400)
RBC: 3.26 MIL/uL — ABNORMAL LOW (ref 3.87–5.11)
RDW: 14.8 % (ref 11.5–15.5)
WBC: 10.1 10*3/uL (ref 4.0–10.5)
nRBC: 0 % (ref 0.0–0.2)

## 2019-08-10 LAB — HEPATITIS B SURFACE ANTIGEN: Hepatitis B Surface Ag: NONREACTIVE

## 2019-08-10 LAB — GLUCOSE, CAPILLARY
Glucose-Capillary: 207 mg/dL — ABNORMAL HIGH (ref 70–99)
Glucose-Capillary: 191 mg/dL — ABNORMAL HIGH (ref 70–99)
Glucose-Capillary: 217 mg/dL — ABNORMAL HIGH (ref 70–99)

## 2019-08-10 NOTE — Progress Notes (Signed)
PROGRESS NOTE    Cynthia Dean  BWL:[id number] DOB: 27-Dec-1950 DOA: 07/30/2019  PCP: Lowella Bandy, MD    LOS - 11   Brief Narrative:  69 y.o.femalewith a history of COPD, coronary artery disease, type 2 diabetes mellitus and end-stage renal disease on hemodialysis on Monday, Wednesday and Friday who apparently missed dialysis on Monday, and presentedto the emergency room with acute onset of generalized weakness,fatigue andfound to be hypoxic inthe 70s on room air by EMS.Reported associated dyspnea and productive cough x 5-7 days. In the ED, hypertensive 165/68, O2 sat 89% on room air. Labs were notable for hyperkalemia 7.3, BUN and Cr consistent with ESRD, BNP was 451 and troponin I 22. LDH was 259 and ferritin 1360. Procalcitonin was 1.03. COVID-19 antigen test positive.CXRrevealed patchy opacities bilaterally that may reflect edema or atypical pneumonia. Patient was treated in the ED with calcium gluconate, D50/insulin, one amp bicarb, IV Decadron, Lokelma and IV Lasix. Admitted to hospitalist service for further management, with nephrology consulted for dialysis.   Subjective 1/16: Patient seen after dialysis today.  No acute events reported overnight.  She denies chest pain or shortness of breath, fevers or chills, nausea vomiting or diarrhea, or other acute complaints.  States she just wants to go home.  She does qualify for home oxygen which we can hopefully obtain tomorrow for patient to be discharged.  She seemed reassured by this.  Assessment & Plan:   Active Problems:   Essential hypertension   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)   Seizure (HCC)   Acute on chronic respiratory failure with hypoxia (HCC)   Acute on chronic diastolic CHF (congestive heart failure) (HCC)   Pneumonia due to COVID-19 virus   Diarrhea   Acute on chronic respiratory failure with hypoxia-likely multifactorial in nature with fluid overload  from ESRD and COVID-19 pneumonia contributing. Patient tells me she uses oxygen only at night at home. Currently down to 4 L/min from 6L/min past couple days. --Supplemental oxygen to maintain pulse ox greater than 90% --Please avoid turning up oxygen more than needed to keep 90% sat --Resumed dexamethasone (stopped previously due to AMS) and patient tolerating well.  Will plan for 10-day course.  Acute on chronic diastolic congestive heart failure-improved --Dialysis for fluid management  Multifocal pneumonia secondary to COVID-19 infection Remdesivir completed. Steroids were discontinued secondary to altered mental status, later resumed as above. Also completed Rocephin and Zithromax for possible community-acquired pneumonia. -Continue vitamin C and zinc -Combivent inhaler --Monitor inflammatory markers  Acute metabolic encephalopathy-likely due to COVID-19 infection. MRI brain negative. Gabapentin has been discontinued. TSH and B12 unremarkable.  No focal motor deficits. Patient seems to have word finding difficulty. Baseline cognition is unknown at this time, will discuss with family.   Patient's fiance indicated that patient has intermittent word-finding difficulties at baseline. --Neurology consulted   Diarrhea-improved with Imodium. Suspect this was secondary to COVID-19 infection.  Anemia of chronic disease-  --Aspirin and Plavix were held by prior physician in case of bleeding given hemoglobin dropped to 8.9 --We will monitor another 24 hours and resume aspirin Plavix if no sign of bleeding and hemoglobin stable  Type 2 diabetes-last A1c 8.3% --Continue Levemir and short-acting insulin --Continue sliding scale NovoLog  Hyperkalemia-persistent secondary to end-stage renal disease --Dialysis to manage  Hyperlipidemia-continue atorvastatin  Seizure disorder-continue Keppra  Essential hypertension-continue low-dose Toprol.Blood pressure  medications have been reduced this admission  Generalized weakness-due to acute illness on top of chronicrenal failure --  PT eval  DVT prophylaxis:Heparin Code Status: Full Code Family Communication:none at bedside Disposition Plan:Hopefully discharge home with home health and oxygen tomorrow, if oxygen requirements stable overnight at 4 L/min.      Consultants:  Nephrology  Neurology  Procedures:  Dialysis  Antimicrobials:  Rocephin and azithromycin-course completed   Objective: Vitals:   08/10/19 1415 08/10/19 1430 08/10/19 1445 08/10/19 1509  BP: (!) 125/55 (!) 100/59 (!) 147/65 129/61  Pulse: 91 92 89 81  Resp:   20 20  Temp:   98.8 F (37.1 C) 98.2 F (36.8 C)  TempSrc:   Oral Oral  SpO2: 99% 99% 100% 99%  Weight:      Height:        Intake/Output Summary (Last 24 hours) at 08/10/2019 1710 Last data filed at 08/10/2019 1445 Gross per 24 hour  Intake --  Output 2600 ml  Net -2600 ml   Filed Weights   08/01/19 1430 08/03/19 0037 08/03/19 0501  Weight: 105.7 kg 105.7 kg 133.5 kg    Examination:  General exam: awake, alert, no acute distress, obese Respiratory system: Bilateral crackles, no wheezes or rhonchi, normal respiratory effort.  On 4 L/min nasal cannula. Cardiovascular system: normal S1/S2, RRR, no pedal edema.   Gastrointestinal system: soft, non-tender, non-distended Central nervous system: alert and oriented x3. no gross focal neurologic deficits, normal speech, no issue with word finding difficulty Extremities: moves all, no edema, normal tone Psychiatry: normal mood, congruent affect, judgement and insight appear normal    Data Reviewed: I have personally reviewed following labs and imaging studies  CBC: Recent Labs  Lab 08/04/19 0517 08/04/19 0517 08/05/19 0612 08/06/19 1226 08/08/19 0512 08/09/19 0520 08/10/19 0626  WBC 6.7   < > 9.2 10.0 12.1* 11.1* 10.1  NEUTROABS 5.0  --   --   --  8.4*  --   --   HGB  9.2*   < > 9.2* 8.9* 8.9* 8.9* 8.9*  HCT 29.6*   < > 29.1* 28.6* 28.2* 29.1* 28.9*  MCV 87.8   < > 87.7 86.7 86.8 88.2 88.7  PLT 165   < > 187 200 246 241 286   < > = values in this interval not displayed.   Basic Metabolic Panel: Recent Labs  Lab 08/05/19 0612 08/06/19 1226 08/08/19 0512 08/09/19 0520 08/10/19 0627  NA 141 136 142 140 140  K 5.3* 5.7* 4.8 5.0 5.4*  CL 98 94* 98 101 98  CO2 22 19* 27 25 24   GLUCOSE 174* 255* 155* 171* 174*  BUN 112* 144* 101* 71* 89*  CREATININE 13.02* 15.11* 12.28* 10.34* 12.80*  CALCIUM 8.8* 8.6* 9.0 8.5* 9.3  MG  --   --  2.2  --   --   PHOS  --  8.1*  --   --  7.4*   GFR: Estimated Creatinine Clearance: 6.2 mL/min (A) (by C-G formula based on SCr of 12.8 mg/dL (H)). Liver Function Tests: Recent Labs  Lab 08/04/19 0517 08/06/19 1226 08/09/19 0520 08/10/19 0627  AST 53*  --  13*  --   ALT 20  --  11  --   ALKPHOS 56  --  55  --   BILITOT 0.8  --  1.3*  --   PROT 6.5  --  6.5  --   ALBUMIN 2.8* 2.8* 2.6* 2.8*   No results for input(s): LIPASE, AMYLASE in the last 168 hours. No results for input(s): AMMONIA in the last 168 hours. Coagulation Profile:  No results for input(s): INR, PROTIME in the last 168 hours. Cardiac Enzymes: No results for input(s): CKTOTAL, CKMB, CKMBINDEX, TROPONINI in the last 168 hours. BNP (last 3 results) No results for input(s): PROBNP in the last 8760 hours. HbA1C: No results for input(s): HGBA1C in the last 72 hours. CBG: Recent Labs  Lab 08/09/19 1205 08/09/19 1638 08/09/19 2135 08/10/19 0742 08/10/19 1650  GLUCAP 172* 190* 175* 207* 191*   Lipid Profile: No results for input(s): CHOL, HDL, LDLCALC, TRIG, CHOLHDL, LDLDIRECT in the last 72 hours. Thyroid Function Tests: No results for input(s): TSH, T4TOTAL, FREET4, T3FREE, THYROIDAB in the last 72 hours. Anemia Panel: No results for input(s): VITAMINB12, FOLATE, FERRITIN, TIBC, IRON, RETICCTPCT in the last 72 hours. Sepsis Labs: No  results for input(s): PROCALCITON, LATICACIDVEN in the last 168 hours.  Recent Results (from the past 240 hour(s))  MRSA PCR Screening     Status: None   Collection Time: 08/01/19 11:12 PM   Specimen: Nasopharyngeal  Result Value Ref Range Status   MRSA by PCR NEGATIVE NEGATIVE Final    Comment:        The GeneXpert MRSA Assay (FDA approved for NASAL specimens only), is one component of a comprehensive MRSA colonization surveillance program. It is not intended to diagnose MRSA infection nor to guide or monitor treatment for MRSA infections. Performed at [facility], [address], [place], [place] [address]          Radiology Studies: No results found.      Scheduled Meds: . vitamin C  500 mg Oral Daily  . atorvastatin  80 mg Oral QPM  . calcium acetate  2,001 mg Oral TID WC  . cholecalciferol  1,000 Units Oral Daily  . dexamethasone (DECADRON) injection  6 mg Intravenous Q24H  . epoetin (EPOGEN/PROCRIT) injection  10,000 Units Intravenous Q T,Th,Sa-HD  . furosemide  60 mg Intravenous Once  . guaiFENesin  600 mg Oral BID  . insulin aspart  0-5 Units Subcutaneous QHS  . insulin aspart  0-6 Units Subcutaneous TID WC  . insulin aspart  3 Units Subcutaneous TID WC  . insulin detemir  8 Units Subcutaneous BID  . Ipratropium-Albuterol  1 puff Inhalation Q6H  . levETIRAcetam  1,000 mg Oral Daily  . levETIRAcetam  250 mg Oral Once per day on Mon Wed Fri  . metoprolol succinate  50 mg Oral Daily  . pantoprazole  40 mg Oral Daily  . zinc sulfate  220 mg Oral Daily   Continuous Infusions: . sodium chloride    . sodium chloride    . sodium chloride Stopped (08/04/19 1200)     LOS: 11 days    Time spent: 30 minutes    Ezekiel Slocumb, DO [facility]   If 7PM-7AM, please contact night-coverage [email or web address] Password [email or web address] 08/10/2019, 5:10 PM

## 2019-08-10 NOTE — Plan of Care (Signed)
  Problem: Clinical Measurements: Goal: Ability to maintain clinical measurements within normal limits will improve Outcome: Progressing Goal: Will remain free from infection Outcome: Progressing Goal: Diagnostic test results will improve Outcome: Progressing Goal: Respiratory complications will improve Outcome: Progressing Goal: Cardiovascular complication will be avoided Outcome: Progressing  HD Tx progressing as PO.

## 2019-08-10 NOTE — Progress Notes (Signed)
[place], [place] 08/10/19  Subjective:   Hospital day # 11  Seen during dialysis.  Tolerating well   HEMODIALYSIS FLOWSHEET:  Blood Flow Rate (mL/min): 385 mL/min Arterial Pressure (mmHg): -190 mmHg Venous Pressure (mmHg): 190 mmHg Transmembrane Pressure (mmHg): 60 mmHg Ultrafiltration Rate (mL/min): 100 mL/min Dialysate Flow Rate (mL/min): 600 ml/min Conductivity: Machine : 13.9 Conductivity: Machine : 13.9 Dialysis Fluid Bolus: Normal Saline Bolus Amount (mL): 250 mL   Renal: 01/15 0701 - 01/16 0700 In: -  Out: 600 [Urine:600] Lab Results  Component Value Date   CREATININE 12.80 (H) 08/10/2019   CREATININE 10.34 (H) 08/09/2019   CREATININE 12.28 (H) 08/08/2019     Objective:  Vital signs in last 24 hours:  Temp:  [93.4 F (34.1 C)-98.8 F (37.1 C)] 98.2 F (36.8 C) (01/16 1509) Pulse Rate:  [76-99] 81 (01/16 1509) Resp:  [18-20] 20 (01/16 1509) BP: (100-168)/(45-84) 129/61 (01/16 1509) SpO2:  [87 %-100 %] 99 % (01/16 1509)  Weight change:  Filed Weights   08/01/19 1430 08/03/19 0037 08/03/19 0501  Weight: 105.7 kg 105.7 kg 133.5 kg    Intake/Output:    Intake/Output Summary (Last 24 hours) at 08/10/2019 1651 Last data filed at 08/10/2019 1445 Gross per 24 hour  Intake --  Output 2600 ml  Net -2600 ml     Physical Exam: General:  Lying in the bed, no acute distress  HEENT  anicteric, moist oral mucous member  Pulm/lungs  lungs normal breathing effort, [place] O2, clear  CVS/Heart  irregular rhythm  Abdomen:   Soft, nontender  Extremities:  No edema  Neurologic:  Alert, able to follow simple commands  Skin:  no acute rashes  Access:  Left upper extremity AV fistula       Basic Metabolic Panel:  Recent Labs  Lab 08/05/19 0612 08/05/19 0612 08/06/19 1226 08/06/19 1226 08/08/19 0512 08/09/19 0520 08/10/19 0627  NA 141  --  136  --  142 140 140  K 5.3*  --  5.7*  --  4.8 5.0 5.4*  CL 98  --  94*  --  98 101  98  CO2 22  --  19*  --  27 25 24   GLUCOSE 174*  --  255*  --  155* 171* 174*  BUN 112*  --  144*  --  101* 71* 89*  CREATININE 13.02*  --  15.11*  --  12.28* 10.34* 12.80*  CALCIUM 8.8*   < > 8.6*   < > 9.0 8.5* 9.3  MG  --   --   --   --  2.2  --   --   PHOS  --   --  8.1*  --   --   --  7.4*   < > = values in this interval not displayed.     CBC: Recent Labs  Lab 08/04/19 0517 08/04/19 0517 08/05/19 0612 08/06/19 1226 08/08/19 0512 08/09/19 0520 08/10/19 0626  WBC 6.7   < > 9.2 10.0 12.1* 11.1* 10.1  NEUTROABS 5.0  --   --   --  8.4*  --   --   HGB 9.2*   < > 9.2* 8.9* 8.9* 8.9* 8.9*  HCT 29.6*   < > 29.1* 28.6* 28.2* 29.1* 28.9*  MCV 87.8   < > 87.7 86.7 86.8 88.2 88.7  PLT 165   < > 187 200 246 241 286   < > = values in this interval not displayed.  Lab Results  Component Value Date   HEPBSAG NON REACTIVE 08/09/2019      Microbiology:  Recent Results (from the past 240 hour(s))  MRSA PCR Screening     Status: None   Collection Time: 08/01/19 11:12 PM   Specimen: Nasopharyngeal  Result Value Ref Range Status   MRSA by PCR NEGATIVE NEGATIVE Final    Comment:        The GeneXpert MRSA Assay (FDA approved for NASAL specimens only), is one component of a comprehensive MRSA colonization surveillance program. It is not intended to diagnose MRSA infection nor to guide or monitor treatment for MRSA infections. Performed at [facility], [address] [place], [place] [address]     Coagulation Studies: No results for input(s): LABPROT, INR in the last 72 hours.  Urinalysis: No results for input(s): COLORURINE, LABSPEC, PHURINE, GLUCOSEU, HGBUR, BILIRUBINUR, KETONESUR, PROTEINUR, UROBILINOGEN, NITRITE, LEUKOCYTESUR in the last 72 hours.  Invalid input(s): APPERANCEUR    Imaging: No results found.   Medications:   . sodium chloride    . sodium chloride    . sodium chloride Stopped (08/04/19 1200)   . vitamin C  500 mg Oral Daily   . atorvastatin  80 mg Oral QPM  . calcium acetate  2,001 mg Oral TID WC  . cholecalciferol  1,000 Units Oral Daily  . dexamethasone (DECADRON) injection  6 mg Intravenous Q24H  . epoetin (EPOGEN/PROCRIT) injection  10,000 Units Intravenous Q T,Th,Sa-HD  . furosemide  60 mg Intravenous Once  . guaiFENesin  600 mg Oral BID  . insulin aspart  0-5 Units Subcutaneous QHS  . insulin aspart  0-6 Units Subcutaneous TID WC  . insulin aspart  3 Units Subcutaneous TID WC  . insulin detemir  8 Units Subcutaneous BID  . Ipratropium-Albuterol  1 puff Inhalation Q6H  . levETIRAcetam  1,000 mg Oral Daily  . levETIRAcetam  250 mg Oral Once per day on Mon Wed Fri  . metoprolol succinate  50 mg Oral Daily  . pantoprazole  40 mg Oral Daily  . zinc sulfate  220 mg Oral Daily   sodium chloride, sodium chloride, sodium chloride, acetaminophen, guaiFENesin-dextromethorphan, heparin, heparin, lidocaine-prilocaine, loperamide, magnesium hydroxide, ondansetron **OR** ondansetron (ZOFRAN) IV, pentafluoroprop-tetrafluoroeth, traZODone  Assessment/ Plan:  69 y.o. 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  Tolerating well  #Anemia of chronic kidney disease Continued on Epogen with dialysis  # Secondary hyperparathyroidism Monitor calcium and phosphorus during  admission Currently on calcium acetate  #COVID-19 pneumonia Completed remdesivir, currently getting dexamethasone    LOS: [place] 1/16/20214:51 PM  [place], [place]  Note: This note was prepared with Dragon dictation. Any transcription errors are unintentional

## 2019-08-10 NOTE — Progress Notes (Signed)
Pre HD Tx Assessment   08/10/19 1115  Neurological  Level of Consciousness Alert  Orientation Level Oriented to person;Oriented to place;Disoriented to time;Disoriented to situation  Respiratory  Respiratory Pattern Regular;Unlabored  Chest Assessment Chest expansion symmetrical  Bilateral Breath Sounds Expiratory wheezes;Diminished  Cardiac  Pulse Regular  Heart Sounds S1, S2  Jugular Venous Distention (JVD) No  ECG Monitor Yes  Cardiac Rhythm NSR  Antiarrhythmic device No  Vascular  R Radial Pulse +2  L Radial Pulse +2  Edema Left lower extremity  Generalized Edema +1  RLE Edema Non-pitting  LLE Edema Non-Pitting  Integumentary  Integumentary (WDL) X  Skin Color Appropriate for ethnicity  Skin Condition Dry  Skin Integrity Abrasion;Ecchymosis;MASD  Ecchymosis Location Abdomen;Arm  Ecchymosis Location Orientation Right;Left  Musculoskeletal  Musculoskeletal (WDL) X  Generalized Weakness Yes  Gastrointestinal  Bowel Sounds Assessment Active  Last BM Date 08/09/19  GU Assessment  Genitourinary (WDL) X  Genitourinary Symptoms Oliguria (ESRD PT)  Psychosocial  Psychosocial (WDL) X  Patient Behaviors Cooperative;Flat affect  Needs Expressed Physical  Emotional support given Given to patient

## 2019-08-10 NOTE — Progress Notes (Signed)
Daughter updated over the phone

## 2019-08-10 NOTE — Progress Notes (Signed)
Post HD Tx Note  Pt tolerated well the tx. Prescribed UF of 2L was met. Pt SPO2 desat while on 5L [place]. Pt is on Non Rebreather Mask 10L O2, current SPO2 99-100%. Pt is confused only alert to self . Pt BP continued to be stable during dialysis. AVG WDL post Tx   08/10/19 1445  Hand-Off documentation  Report given to (Full Name) Cherylin Mylar RN   Report received from (Full Name) Newt Minion RN   Vital Signs  Temp 98.8 F (37.1 C)  Temp Source Oral  Pulse Rate 89  Pulse Rate Source Monitor  Resp 20  BP (!) 147/65  BP Location Right Arm  BP Method Automatic  Patient Position (if appropriate) Lying  Oxygen Therapy  SpO2 100 %  O2 Device Non-rebreather Mask  O2 Flow Rate (L/min) 10 L/min  Post-Hemodialysis Assessment  Rinseback Volume (mL) 250 mL  KECN 51 V  Dialyzer Clearance Lightly streaked  Duration of HD Treatment -hour(s) 2.5 hour(s)  Hemodialysis Intake (mL) 500 mL  UF Total -Machine (mL) 2500 mL  Net UF (mL) 2000 mL  Tolerated HD Treatment Yes  Fistula / Graft Left Forearm Arteriovenous fistula  No Placement Date or Time found.   Orientation: Left  Access Location: Forearm  Access Type: Arteriovenous fistula  Site Condition No complications  Fistula / Graft Assessment Present;Thrill;Bruit  Status Deaccessed  Needle Size 15  Drainage Description None

## 2019-08-10 NOTE — TOC Progression Note (Signed)
Transition of Care [facility]) - Progression Note    Patient Details  Name: Cynthia Dean MRN: [id number] Date of Birth: 1950-09-11  Transition of Care [facility]) CM/SW Contact  Shelbie Ammons, RN Phone Number: 08/10/2019, 10:55 AM  Clinical Narrative:   RNCM placed call to patient's fiance and left message for return call regarding PT recommendation of SNF.    Expected Discharge Plan: [place]    Expected Discharge Plan and Services Expected Discharge Plan: [place]   Discharge Planning Services: CM Consult   Living arrangements for the past 2 months: Single Family Home                     Date DME Agency Contacted: 08/07/19 Time DME Agency Contacted: [phone] Representative spoke with at DME Agency: [place]: RN, PT, Nurse's Aide [place] Agency: [place] Date [place]: 08/07/19   Representative spoke with at [place]: [place] ([place]) Interventions    Readmission Risk Interventions Readmission Risk Prevention Plan 08/07/2019 02/15/2019 01/02/2019  Transportation Screening Complete - Complete  PCP or Specialist Appt within 3-5 Days - - Complete  Palliative Care Screening - - Not Applicable  Medication Review (RN Care Manager) Complete Complete -  [place] or Home Care Consult Complete - -  Palliative Care Screening Not Applicable - -  [place] Not Applicable - -  Some recent data might be hidden

## 2019-08-10 NOTE — Progress Notes (Signed)
HD Tx Completed    08/10/19 1430  Vital Signs  Pulse Rate 92  BP (!) 100/59  Oxygen Therapy  SpO2 99 %  O2 Device Non-rebreather Mask  O2 Flow Rate (L/min) 10 L/min  During Hemodialysis Assessment  Blood Flow Rate (mL/min) 385 mL/min  Arterial Pressure (mmHg) -190 mmHg  Venous Pressure (mmHg) 190 mmHg  Transmembrane Pressure (mmHg) 60 mmHg  Ultrafiltration Rate (mL/min) 100 mL/min  Dialysate Flow Rate (mL/min) 600 ml/min  Conductivity: Machine  13.9  HD Safety Checks Performed Yes

## 2019-08-10 NOTE — Progress Notes (Signed)
Pre HD Tx Note Pt arrived from her room to receive routine HD Tx. Airborne precautions maintained. Pt is on 5L O2 per [place]. SPO2 95%. Pt is O*2. Currently report cno chest pain or SOB. Tx should run for 2.5hrs on 2K2.5Ca. BP and AVG WDL   08/10/19 1130  Hand-Off documentation  Report given to (Full Name) Newt Minion RN   Report received from (Full Name) Eli Hose RN   Vital Signs  Temp (!) 93.4 F (34.1 C)  Temp Source Oral  Pulse Rate 92  Pulse Rate Source Monitor  Resp 20  BP (!) 164/74  BP Location Right Arm  BP Method Automatic  Patient Position (if appropriate) Lying  Oxygen Therapy  SpO2 95 %  O2 Device Nasal Cannula  Pain Assessment  Pain Scale 0-10  Pain Score 0  Time-Out for Hemodialysis  What Procedure? HD   Pt Identifiers(min of two) First/Last Name;MRN/Account#  Correct Site? Yes  Correct Side? Yes  Correct Procedure? Yes  Consents Verified? Yes  Safety Precautions Reviewed? Yes  Engineer, civil (consulting) Number 4  Station Number  ([id number])  UF/Alarm Test Passed  Conductivity: Meter 14  Conductivity: Machine  13.6  pH 7.6  Reverse Osmosis  (RO 2/21)  Normal Saline Lot Number [id number]  Dialyzer Lot Number [id number]  Disposable Set Lot Number [id number]  Machine Temperature 98.8 F (37.1 C)  Musician and Audible Yes  Blood Lines Intact and Secured Yes  Pre Treatment Patient Checks  Vascular access used during treatment Graft  HD catheter dressing before treatment WDL  Patient is receiving dialysis in a chair  (in bed)  Hepatitis B Surface Antigen Results Negative  Isolation Initiated Yes (COVID)  Hepatitis B Surface Antibody  (>10)  Date Hepatitis B Surface Antibody Drawn 12/31/18  Hemodialysis Consent Verified Yes  Hemodialysis Standing Orders Initiated Yes  Prime Ordered Normal Saline  Length of  DialysisTreatment -hour(s) 2.5 Hour(s)  Dialysis Treatment Comments  (Na140)  Dialyzer Elisio 17H NR  Dialysate 2K;2.5 Ca  Dialysis  Anticoagulant None  Dialysate Flow Ordered 600  Blood Flow Rate Ordered 400 mL/min  Ultrafiltration Goal 2 Liters  Pre Treatment Labs Renal panel  Dialysis Blood Pressure Support Ordered Albumin  Education / Care Plan  Dialysis Education Provided Yes  Documented Education in Care Plan Yes  Fistula / Graft Left Forearm Arteriovenous fistula  No Placement Date or Time found.   Orientation: Left  Access Location: Forearm  Access Type: Arteriovenous fistula  Site Condition No complications  Fistula / Graft Assessment Present;Thrill;Bruit  Status Accessed  Needle Size 15  Drainage Description None

## 2019-08-10 NOTE — Progress Notes (Signed)
HD Tx Started    08/10/19 1145  Vital Signs  Pulse Rate 98  BP (!) 168/67  Oxygen Therapy  SpO2 95 %  O2 Device Nasal Cannula  During Hemodialysis Assessment  Blood Flow Rate (mL/min) 300 mL/min  Arterial Pressure (mmHg) -150 mmHg  Venous Pressure (mmHg) 180 mmHg  Transmembrane Pressure (mmHg) 60 mmHg  Ultrafiltration Rate (mL/min) 1000 mL/min  Dialysate Flow Rate (mL/min) 600 ml/min  Conductivity: Machine  14  HD Safety Checks Performed Yes  Dialysis Fluid Bolus Normal Saline  Bolus Amount (mL) 250 mL  Intra-Hemodialysis Comments Tx initiated     08/10/19 1145  Vital Signs  Pulse Rate 98  BP (!) 168/67  Oxygen Therapy  SpO2 95 %  O2 Device Nasal Cannula  During Hemodialysis Assessment  Blood Flow Rate (mL/min) 300 mL/min  Arterial Pressure (mmHg) -150 mmHg  Venous Pressure (mmHg) 180 mmHg  Transmembrane Pressure (mmHg) 60 mmHg  Ultrafiltration Rate (mL/min) 1000 mL/min  Dialysate Flow Rate (mL/min) 600 ml/min  Conductivity: Machine  14  HD Safety Checks Performed Yes  Dialysis Fluid Bolus Normal Saline  Bolus Amount (mL) 250 mL  Intra-Hemodialysis Comments Tx initiated

## 2019-08-10 NOTE — TOC Progression Note (Signed)
Transition of Care [facility]) - Progression Note    Patient Details  Name: Kensy Blizard MRN: [id number] Date of Birth: 09-Jul-1951  Transition [facility]) CM/SW Contact  Shelbie Ammons, RN Phone Number: 08/10/2019, 12:13 PM  Clinical Narrative:   RNCM placed call to both patient's fiance and patient's daughter they both verbalized that they want patient to come home.     Expected Discharge Plan: [place]    Expected Discharge Plan and Services Expected Discharge Plan: [place]   Discharge Planning Services: CM Consult   Living arrangements for the past 2 months: Single Family Home                     Date DME Agency Contacted: 08/07/19 Time DME Agency Contacted: [phone] Representative spoke with at DME Agency: [place]: RN, PT, Nurse's Aide [place] Agency: [place] Date [place]: 08/07/19   Representative spoke with at [place]: [place] ([place]) Interventions    Readmission Risk Interventions Readmission Risk Prevention Plan 08/07/2019 02/15/2019 01/02/2019  Transportation Screening Complete - Complete  PCP or Specialist Appt within 3-5 Days - - Complete  Palliative Care Screening - - Not Applicable  Medication Review (RN Care Manager) Complete Complete -  [place] or Home Care Consult Complete - -  Palliative Care Screening Not Applicable - -  [place] Not Applicable - -  Some recent data might be hidden

## 2019-08-10 NOTE — Progress Notes (Signed)
SATURATION QUALIFICATIONS: (This note is used to comply with regulatory documentation for home oxygen)  Patient Saturations on Room Air at Rest = 85%  Patient Saturations on Room Air while Ambulating = NA  Patient Saturations on 4 Liters of oxygen  at rest 93 %  Please briefly explain why patient needs home oxygen: patient's oxygen dropped significantly while laying in bed on room air. Patient placed back on 4 L via Nasal cannula. O2 now 93%

## 2019-08-11 DIAGNOSIS — E1122 Type 2 diabetes mellitus with diabetic chronic kidney disease: Secondary | ICD-10-CM

## 2019-08-11 LAB — BASIC METABOLIC PANEL
Anion gap: 15 (ref 5–15)
BUN: 65 mg/dL — ABNORMAL HIGH (ref 8–23)
CO2: 27 mmol/L (ref 22–32)
Calcium: 9.7 mg/dL (ref 8.9–10.3)
Chloride: 97 mmol/L — ABNORMAL LOW (ref 98–111)
Creatinine, Ser: 9.52 mg/dL — ABNORMAL HIGH (ref 0.44–1.00)
GFR calc Af Amer: 4 mL/min — ABNORMAL LOW (ref >60)
GFR calc non Af Amer: 4 mL/min — ABNORMAL LOW (ref >60)
Glucose, Bld: 251 mg/dL — ABNORMAL HIGH (ref 70–99)
Potassium: 4.8 mmol/L (ref 3.5–5.1)
Sodium: 139 mmol/L (ref 135–145)

## 2019-08-11 LAB — GLUCOSE, CAPILLARY
Glucose-Capillary: 260 mg/dL — ABNORMAL HIGH (ref 70–99)
Glucose-Capillary: 354 mg/dL — ABNORMAL HIGH (ref 70–99)

## 2019-08-11 MED ORDER — CALCIUM ACETATE (PHOS BINDER) 667 MG PO CAPS: 2001.0000 mg | ORAL_CAPSULE | Freq: Three times a day (TID) | ORAL | Status: DC

## 2019-08-11 MED ORDER — VITAMIN D3 25 MCG PO TABS: 1000.0000 [IU] | ORAL_TABLET | Freq: Every day | ORAL | Status: DC

## 2019-08-11 MED ORDER — GUAIFENESIN-DM 100-10 MG/5ML PO SYRP: 10.0000 mL | ORAL_SOLUTION | ORAL | 0 refills | Status: DC | PRN

## 2019-08-11 MED ORDER — IPRATROPIUM-ALBUTEROL 20-100 MCG/ACT IN AERS: 1.0000 | INHALATION_SPRAY | Freq: Four times a day (QID) | RESPIRATORY_TRACT | 0 refills | Status: DC

## 2019-08-11 NOTE — Plan of Care (Signed)
  Problem: Clinical Measurements: Goal: Ability to maintain clinical measurements within normal limits will improve Outcome: Progressing   Problem: Clinical Measurements: Goal: Will remain free from infection Outcome: Progressing   Problem: Clinical Measurements: Goal: Diagnostic test results will improve Outcome: Progressing   

## 2019-08-11 NOTE — TOC Transition Note (Signed)
Transition of Care [facility]) - CM/SW Discharge Note   Patient Details  Name: Cynthia Dean MRN: [id number] Date of Birth: 1951-07-06  Transition of Care [facility]) CM/SW Contact:  Victorino Dike, RN Phone Number: 08/11/2019, 11:55 AM   Clinical Narrative:     Patient to discharge home with family today.  [facility] to provide oxygen, rolling walker and bedside commode.   [facility] to provide [facility] PT, RN and aide.  No further TOC needs at this time, please re-consult for new needs.     Final next level of care: [place] Barriers to Discharge: Barriers Resolved   Patient Goals and CMS Choice        Discharge Placement                       Discharge Plan and Services   Discharge Planning Services: CM Consult            DME Arranged: 3-N-1, Walker rolling DME Agency: [facility], [facility]) Date DME Agency Contacted: 08/11/19 Time DME Agency Contacted: [phone] Representative spoke with at DME Agency: [place]: RN, PT, Nurse's Aide [place] Agency: [place] Date [place]: 08/07/19   Representative spoke with at [place]: [place] ([place]) Interventions     Readmission Risk Interventions Readmission Risk Prevention Plan 08/07/2019 02/15/2019 01/02/2019  Transportation Screening Complete - Complete  PCP or Specialist Appt within 3-5 Days - - Complete  Palliative Care Screening - - Not Applicable  Medication Review (RN Care Manager) Complete Complete -  [place] or Home Care Consult Complete - -  Palliative Care Screening Not Applicable - -  [place] Not Applicable - -  Some recent data might be hidden

## 2019-08-11 NOTE — Plan of Care (Signed)
Patient is discharging home.  See progress note.

## 2019-08-11 NOTE — Progress Notes (Signed)
Discharge teaching completed with Cynthia Dean who verbalized understanding of education, medications, and follow up information.  Patient is in stable condition and has all of her belongings.

## 2019-08-11 NOTE — Plan of Care (Signed)
Continuing with plan of care. 

## 2019-08-17 NOTE — Discharge Summary (Signed)
Physician Discharge Summary  Mane Consolo POE:[id number] DOB: 16-Nov-1950 DOA: 07/30/2019  PCP: Lowella Bandy, MD  Admit date: 07/30/2019 Discharge date: 08/17/2019  Admitted From: Home Disposition:  Home  Recommendations for Outpatient Follow-up:  1. Follow up with PCP in 1-2 weeks 2. Please obtain BMP/CBC in one week 3. Please follow up with nephrology and dialysis appointments as scheduled  Home Health: Yes  Equipment/Devices: Yes [facility] to provide oxygen, rolling walker and bedside commode.    [facility] to provide [facility] PT, RN and aide.  Discharge Condition: Stable CODE STATUS: Full  Diet recommendation: Renal   Brief/Interim Summary:  69 y.o.femalewith a history of COPD, coronary artery disease, type 2 diabetes mellitus and end-stage renal disease on hemodialysis on Monday, Wednesday and Friday who apparently missed dialysis on Monday, and presentedto the emergency room with acute onset of generalized weakness,fatigue andfound to be hypoxic inthe 70s on room air by EMS.Reported associated dyspnea and productive cough x 5-7 days. In the ED, hypertensive 165/68, O2 sat 89% on room air. Labs were notable for hyperkalemia 7.3, BUN and Cr consistent with ESRD, BNP was 451 and troponin I 22. LDH was 259 and ferritin 1360. Procalcitonin was 1.03. COVID-19 antigen test positive.CXRrevealed patchy opacities bilaterally that may reflect edema or atypical pneumonia. Patient was treated in the ED with calcium gluconate, D50/insulin, one amp bicarb, IV Decadron, Lokelma and IV Lasix. Admitted to hospitalist service for further management, with nephrology consulted for dialysis.   Acute on chronic respiratory failure with hypoxia-likely multifactorial in nature with fluid overload fromESRD andCOVID-19 pneumonia contributing. Patient tells me she uses oxygen only at night at home. Currently down to 4 L/min from 6L/min past couple days. --Supplemental oxygen to  maintain pulse ox greater than 90%  Acute on chronic diastolic congestive heart failure-improved --Dialysis for fluid management  Multifocal pneumonia secondary to COVID-19 infection Remdesivir completed. Steroids were discontinued secondary to altered mental status, later resumed as above. Also completed Rocephin and Zithromax for possible community-acquired pneumonia. -Continue vitamin C and zinc -Combivent inhaler --Monitor inflammatory markers  Acute metabolic encephalopathyResolved -likely due to COVID-19 infection. MRI brain negative. Gabapentin has been discontinued. TSH and B12 unremarkable. No focal motor deficits. Patient seems to have word finding difficulty. Baseline cognition is unknown at this time, will discuss with family. Patient's fiance indicated that patient has intermittent word-finding difficulties at baseline. --Neurology consulted   Diarrhea-improved with Imodium. Suspect this was secondary to COVID-19 infection.  No suspicion for c diff without leukocytosis, intermittent in nature.  Diarrhea resolved prior to d/c.  Anemia of chronic disease-  --no sign of bleeding, stable.  ASA/plavix resumed.  Type 2 diabetes-last A1c 8.3% --Continue Levemir and short-acting insulin --Continue sliding scale NovoLog  Hyperkalemia-persistent secondary to end-stage renal disease --Dialysis to manage  Hyperlipidemia-continue atorvastatin  Seizure disorder-continue Keppra  Essential hypertension-continue low-dose Toprol.Blood pressure medications have been reduced this admission  Generalized weakness-due to acute illness on top of chronicrenal failure. Improved. --PT.  Home with home health PT.  Discharge Diagnoses: Active Problems:   Essential hypertension   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)   Seizure (HCC)   Acute on chronic respiratory failure with hypoxia (HCC)   Acute on chronic diastolic CHF  (congestive heart failure) ([place])   Pneumonia due to COVID-19 virus   Diarrhea    Discharge Instructions   Discharge Instructions    MyChart COVID-19 home monitoring program   Complete by: Aug 12, 2019    Is  the patient willing to use the [facility] for home monitoring?: No   Call MD for:   Complete by: As directed    Worsening shortness of breath or needing to turn up oxygen flow rate to keep oxygen level above 90%.   Call MD for:  extreme fatigue   Complete by: As directed    Call MD for:  temperature >100.4   Complete by: As directed    Diet - low sodium heart healthy   Complete by: As directed    Increase activity slowly   Complete by: As directed      Allergies as of 08/11/2019      Reactions   Hydrocodone    Intolerant more than allergic   Tape Itching   Skin Dermatitis/itching (tape adhesive) Skin Dermatitis/itching (tape adhesive)   Tapentadol Itching   Skin Dermatitis/itching (tape adhesive) Skin Dermatitis/itching (tape adhesive)      Medication List    TAKE these medications   aspirin EC 325 MG tablet Take 325 mg by mouth daily.   atorvastatin 80 MG tablet Commonly known as: LIPITOR Take 80 mg by mouth every evening.   calcium acetate 667 MG capsule Commonly known as: PHOSLO Take 3 capsules (2,001 mg total) by mouth 3 (three) times daily with meals. What changed: when to take this   clopidogrel 75 MG tablet Commonly known as: PLAVIX TAKE ONE TABLET BY MOUTH ONCE DAILY   gabapentin 100 MG capsule Commonly known as: Neurontin Take 2 capsules (200 mg total) by mouth 3 (three) times daily. What changed: how much to take   guaiFENesin-dextromethorphan 100-10 MG/5ML syrup Commonly known as: ROBITUSSIN DM Take 10 mLs by mouth every 4 (four) hours as needed for cough.   insulin NPH-regular Human (70-30) 100 UNIT/ML injection Inject 25 Units into the skin 2 (two) times daily with a meal. Except dialysis days. Do not take on Tuesdays,  Thursdays, and Saturdays.   Ipratropium-Albuterol 20-100 MCG/ACT Aers respimat Commonly known as: COMBIVENT Inhale 1 puff into the lungs every 6 (six) hours.   levETIRAcetam 250 MG tablet Commonly known as: KEPPRA Take 1 tablet (250 mg total) by mouth 3 (three) times a week. Take after dialysis on Monday, wed and Friday in addition to regular dose.   levETIRAcetam 1000 MG tablet Commonly known as: KEPPRA Take 1 tablet (1,000 mg total) by mouth daily.   lidocaine-prilocaine cream Commonly known as: EMLA Apply 1 application topically as needed (for port access).   losartan 100 MG tablet Commonly known as: COZAAR Take 100 mg by mouth daily. Takes in the morning   metoprolol succinate 100 MG 24 hr tablet Commonly known as: TOPROL-XL Take 100 mg by mouth daily. Take with or immediately following a meal.   omeprazole 20 MG capsule Commonly known as: PRILOSEC Take 20 mg by mouth 2 (two) times daily.   torsemide 100 MG tablet Commonly known as: DEMADEX Take 100 mg by mouth daily.   Vitamin D3 25 MCG tablet Commonly known as: Vitamin D Take 1 tablet (1,000 Units total) by mouth daily.      Follow-up Information    [name], Mliss Sax, MD. Schedule an appointment as soon as possible for a visit in 1 week(s).   Specialty: Pediatrics Contact information: [place] [address] [phone]        Murlean Iba, MD. Go to.   Specialty: Nephrology Why: regular follow up and dialysis Contact information: [address]  [place] [address] [phone]          Allergies  Allergen Reactions  . Hydrocodone     Intolerant more than allergic  . Tape Itching    Skin Dermatitis/itching (tape adhesive) Skin Dermatitis/itching (tape adhesive)  . Tapentadol Itching    Skin Dermatitis/itching (tape adhesive) Skin Dermatitis/itching (tape adhesive)     Consultations:  Nephrology    Procedures/Studies: CT  HEAD WO CONTRAST  Result Date: 08/02/2019 CLINICAL DATA:  Altered mental status EXAM: CT HEAD WITHOUT CONTRAST TECHNIQUE: Contiguous axial images were obtained from the base of the skull through the vertex without intravenous contrast. COMPARISON:  10/03/2018 FINDINGS: Brain: There is no acute intracranial hemorrhage, mass-effect, or edema. There is no new loss of gray-white differentiation. Chronic bilateral frontal, left posterior temporal, left basal ganglia, and bilateral cerebellar infarcts are again identified. Additional patchy and confluent areas of hypoattenuation in the supratentorial white matter likely reflects stable chronic microvascular ischemic changes. There is no extra-axial fluid collection. Ventricles and sulci are stable in size and configuration. Vascular: There is atherosclerotic calcification at the skull base. Skull: Calvarium is unremarkable. Sinuses/Orbits: No acute finding. Other: None. IMPRESSION: No acute intracranial abnormality. Stable chronic/nonemergent findings detailed above. Electronically Signed   By: Macy Mis M.D.   On: 08/02/2019 18:04   MR BRAIN WO CONTRAST  Result Date: 08/04/2019 CLINICAL DATA:  Altered mental status, COVID infection EXAM: MRI HEAD WITHOUT CONTRAST TECHNIQUE: Multiplanar, multiecho pulse sequences of the brain and surrounding structures were obtained without intravenous contrast. COMPARISON:  01/01/2019 FINDINGS: Motion artifact is present. Brain: There is no acute infarction or intracranial hemorrhage. There is no intracranial mass, mass effect, or edema. There is no hydrocephalus or extra-axial fluid collection. Prominence of the ventricles and sulci reflecting stable parenchymal volume loss. Multiple chronic infarcts are again identified including of the bilateral frontal lobes, left parietal temporal lobes, left basal ganglia, and bilateral cerebellar hemispheres. Additional patchy and confluent T2 hyperintensity in the supratentorial white  matter likely reflects stable chronic microvascular ischemic changes. Vascular: Major vessel flow voids at the skull base are preserved. Skull and upper cervical spine: Normal marrow signal is preserved. Sinuses/Orbits: Paranasal sinuses are aerated. Orbits are unremarkable. Other: Partially empty sella.  Mastoid air cells are clear. IMPRESSION: No significant change since 01/01/2019. No acute infarction, hemorrhage, or mass. Chronic findings detailed above. Electronically Signed   By: Macy Mis M.D.   On: 08/04/2019 17:24   DG Chest Port 1 View  Result Date: 07/30/2019 CLINICAL DATA:  Weakness EXAM: PORTABLE CHEST 1 VIEW COMPARISON:  03/23/2019 FINDINGS: Patchy opacities superimposed on chronic interstitial prominence. No significant pleural effusion. No pneumothorax. Cardiomediastinal contours are within normal limits. A loop recorder is again noted. IMPRESSION: Patchy opacities bilateral, which may reflect edema or atypical pneumonia. Electronically Signed   By: Macy Mis M.D.   On: 07/30/2019 20:08   CUP PACEART REMOTE DEVICE CHECK  Result Date: 08/04/2019 [facility] summary report received. Battery status OK. Normal device function. No new symptom episodes, tachy episodes, brady, or pause episodes. No new AF episodes. Monthly summary reports and ROV/PRN. SChancey       Subjective: Patient feeling well this AM.  Happy about going home.  States no fever or chills, chest pain or SOB, N/V/D or other complaints.  No acute events reported.     Discharge Exam: Vitals:   08/11/19 0643 08/11/19 1207  BP: (!) 150/54 138/76  Pulse: 88 74  Resp: 20   Temp:  97.8 F (36.6  C)  SpO2: 90% 98%   Vitals:   08/10/19 1910 08/10/19 2137 08/11/19 0643 08/11/19 1207  BP:  (!) 128/55 (!) 150/54 138/76  Pulse: 86 80 88 74  Resp:  20 20   Temp:  98.9 F (37.2 C)  97.8 F (36.6 C)  TempSrc:  Oral    SpO2: 95% 97% 90% 98%  Weight:      Height:        General: Pt is alert, awake, not in  acute distress, obese Cardiovascular: RRR, S1/S2 +, no rubs, no gallops Respiratory: CTA bilaterally diminished at bases, no wheezing, no rhonchi Abdominal: Soft, NT, ND, bowel sounds + Extremities: trace LE edema, no cyanosis    The results of significant diagnostics from this hospitalization (including imaging, microbiology, ancillary and laboratory) are listed below for reference.     Microbiology: No results found for this or any previous visit (from the past 240 hour(s)).   Labs: BNP (last 3 results) Recent Labs    12/31/18 0818 07/30/19 1957  BNP 528.0* 417.4*   Basic Metabolic Panel: Recent Labs  Lab 08/11/19 0634  NA 139  K 4.8  CL 97*  CO2 27  GLUCOSE 251*  BUN 65*  CREATININE 9.52*  CALCIUM 9.7   Liver Function Tests: No results for input(s): AST, ALT, ALKPHOS, BILITOT, PROT, ALBUMIN in the last 168 hours. No results for input(s): LIPASE, AMYLASE in the last 168 hours. No results for input(s): AMMONIA in the last 168 hours. CBC: No results for input(s): WBC, NEUTROABS, HGB, HCT, MCV, PLT in the last 168 hours. Cardiac Enzymes: No results for input(s): CKTOTAL, CKMB, CKMBINDEX, TROPONINI in the last 168 hours. BNP: Invalid input(s): POCBNP CBG: Recent Labs  Lab 08/10/19 2136 08/11/19 0757 08/11/19 1204  GLUCAP 217* 260* 354*   D-Dimer No results for input(s): DDIMER in the last 72 hours. Hgb A1c No results for input(s): HGBA1C in the last 72 hours. Lipid Profile No results for input(s): CHOL, HDL, LDLCALC, TRIG, CHOLHDL, LDLDIRECT in the last 72 hours. Thyroid function studies No results for input(s): TSH, T4TOTAL, T3FREE, THYROIDAB in the last 72 hours.  Invalid input(s): FREET3 Anemia work up No results for input(s): VITAMINB12, FOLATE, FERRITIN, TIBC, IRON, RETICCTPCT in the last 72 hours. Urinalysis    Component Value Date/Time   COLORURINE STRAW (A) 12/31/2018 0842   APPEARANCEUR CLEAR (A) 12/31/2018 0842   APPEARANCEUR Clear  06/14/2014 1510   LABSPEC 1.010 12/31/2018 0842   LABSPEC 1.006 06/14/2014 1510   PHURINE 8.0 12/31/2018 0842   GLUCOSEU 150 (A) 12/31/2018 0842   GLUCOSEU >=500 06/14/2014 1510   HGBUR LARGE (A) 12/31/2018 0842   BILIRUBINUR NEGATIVE 12/31/2018 0842   BILIRUBINUR Negative 06/14/2014 [place] 12/31/2018 0842   PROTEINUR 100 (A) 12/31/2018 0842   NITRITE NEGATIVE 12/31/2018 0842   LEUKOCYTESUR NEGATIVE 12/31/2018 0842   LEUKOCYTESUR Negative 06/14/2014 1510   Sepsis Labs Invalid input(s): PROCALCITONIN,  WBC,  LACTICIDVEN Microbiology No results found for this or any previous visit (from the past 240 hour(s)).   Time coordinating discharge: Over 30 minutes  SIGNED:   Ezekiel Slocumb, DO [facility] 08/17/2019, 7:35 PM   If 7PM-7AM, please contact night-coverage [email or web address]

## 2019-09-02 ENCOUNTER — Ambulatory Visit (INDEPENDENT_AMBULATORY_CARE_PROVIDER_SITE_OTHER): Payer: Medicare HMO | Admitting: *Deleted

## 2019-09-02 DIAGNOSIS — I6310 Cerebral infarction due to embolism of unspecified precerebral artery: Secondary | ICD-10-CM | POA: Diagnosis not present

## 2019-09-02 LAB — CUP PACEART REMOTE DEVICE CHECK
Date Time Interrogation Session: 20210208000319
Implantable Pulse Generator Implant Date: 20190425

## 2019-09-03 NOTE — Progress Notes (Signed)
ILR Remote 

## 2019-09-09 ENCOUNTER — Other Ambulatory Visit: Payer: Self-pay

## 2019-09-09 ENCOUNTER — Ambulatory Visit: Payer: Medicare HMO | Admitting: Gastroenterology

## 2019-09-09 ENCOUNTER — Encounter: Payer: Self-pay | Admitting: Gastroenterology

## 2019-09-09 VITALS — BP 153/73 | HR 82 | Temp 98.5°F | Resp 17 | Ht 69.0 in | Wt 295.0 lb

## 2019-09-09 DIAGNOSIS — K625 Hemorrhage of anus and rectum: Secondary | ICD-10-CM

## 2019-09-09 DIAGNOSIS — K640 First degree hemorrhoids: Secondary | ICD-10-CM

## 2019-09-09 MED ORDER — HYDROCORTISONE (PERIANAL) 2.5 % EX CREA: 1.0000 "application " | TOPICAL_CREAM | Freq: Two times a day (BID) | CUTANEOUS | 1 refills | Status: DC

## 2019-09-09 NOTE — Progress Notes (Signed)
Cephas Darby, MD [address]  [place]  [place], [place] [address]  Main: [phone]  Fax: [phone]    Gastroenterology Consultation  Referring Provider:     Lowella Bandy, MD Primary Care Physician:  Lowella Bandy, MD Primary Gastroenterologist:  Dr. Bonna Gains Reason for Consultation: Rectal bleeding, normocytic anemia        HPI:   Cynthia Dean is a 69 y.o. female referred by Dr. Marilynn Rail, Mliss Sax, MD  for consultation & management of rectal bleeding, normocytic anemia.  Patient reports that she has been noticing bright red blood per rectum predominantly on wiping and sometimes in the toilet bowel intermittently and more recently she has been having rectal discomfort.  Patient is accompanied by her daughter today who reports that she spends a lot of time daily on the toilet particularly when she misses a day when she does not have a BM.  Patient denies hard stools, however reports pushing/straining.  She denies any rectal prolapse.  Patient was admitted and evaluated for similar symptoms in 01/2019 when she was hospitalized.  Underwent upper endoscopy and colonoscopy by Dr. Alice Reichert which was unremarkable.  Patient has been applying Vaseline only.  She does have anemia of chronic kidney disease.  Her anemia has been stable within the past year.  Iron studies are consistent with anemia of chronic disease.  Normal vitamin B12.  She denies melena, abdominal pain, nausea, vomiting.  NSAIDs: None  Antiplts/Anticoagulants/Anti thrombotics: Aspirin 325 mg and Plavix 75 mg daily  GI Procedures:  EGD and colonoscopy 02/16/2019  - Widely patent Schatzki ring. - Normal mucosa was found in the entire esophagus. - 1 cm hiatal hernia. - Gastritis. - Erythematous duodenopathy. - Normal second portion of the duodenum and third portion of the duodenum. - The examination was otherwise normal. - No specimens collected.  - Erythematous mucosa in the proximal  ascending colon. Biopsied. - The examined portion of the ileum was normal. - The examination was otherwise normal on direct and retroflexion views.  DIAGNOSIS:  A. COLON, PROXIMAL ASCENDING; COLD BIOPSY:  - BENIGN COLONIC MUCOSA.  - NEGATIVE FOR FEATURES OF ISCHEMIA.  - NEGATIVE FOR INFLAMMATION, DYSPLASIA, AND MALIGNANCY.   Past Medical History:  Diagnosis Date  . Carotid arterial disease ([facility])    a. 02/2018 < bilat ICA stenosis.  [name] COPD (chronic obstructive pulmonary disease) ([facility])   . Coronary artery disease    a. 11/2015 MV: EF 57%, no ischemia/infarct.  . Cryptogenic stroke ([facility])    a. 10/2017 s/p implantable loop recorder (no Afib to date).  . Diabetes mellitus without complication ([facility])   . ESRD on dialysis ([facility])   . GI bleed    a. 01/2019 EGD/Colonoscopy: Mild gastritis. Colitis.  [name] Heart murmur    a. 12/2018 Echo: EF 55-60%, impaired relaxation. Mod dil LA. Mildly dil RA. Mild Ca2+ of AoV.  [name] Hyperlipidemia   . Hypertension     Past Surgical History:  Procedure Laterality Date  . A/V FISTULAGRAM Left 12/20/2016   Procedure: A/V Fistulagram;  Surgeon: Katha Cabal, MD;  Location: [facility];  Service: Cardiovascular;  Laterality: Left;  . A/V SHUNT INTERVENTION N/A 12/20/2016   Procedure: A/V Shunt Intervention;  Surgeon: Katha Cabal, MD;  Location: [facility];  Service: Cardiovascular;  Laterality: N/A;  . A/V SHUNTOGRAM Left 09/11/2017   Procedure: A/V SHUNTOGRAM;  Surgeon: Algernon Huxley, MD;  Location: [facility];  Service: Cardiovascular;  Laterality: Left;  . BREAST BIOPSY Bilateral 07/19/00   neg  . COLONOSCOPY WITH PROPOFOL N/A 02/16/2019   Procedure: COLONOSCOPY WITH PROPOFOL;  Surgeon: Toledo, Benay Pike, MD;  Location: [facility];  Service: Gastroenterology;  Laterality: N/A;  . ESOPHAGOGASTRODUODENOSCOPY (EGD) WITH PROPOFOL N/A 02/16/2019   Procedure: ESOPHAGOGASTRODUODENOSCOPY (EGD) WITH PROPOFOL;  Surgeon: Toledo,  Benay Pike, MD;  Location: [facility];  Service: Gastroenterology;  Laterality: N/A;  . LOOP RECORDER INSERTION N/A 11/16/2017   Procedure: LOOP RECORDER INSERTION;  Surgeon: Deboraha Sprang, MD;  Location: [facility];  Service: Cardiovascular;  Laterality: N/A;  . PERIPHERAL VASCULAR CATHETERIZATION Left 02/02/2015   Procedure: A/V Shuntogram/Fistulagram;  Surgeon: Algernon Huxley, MD;  Location: [facility];  Service: Cardiovascular;  Laterality: Left;  . PERIPHERAL VASCULAR CATHETERIZATION Left 02/02/2015   Procedure: A/V Shunt Intervention;  Surgeon: Algernon Huxley, MD;  Location: [facility];  Service: Cardiovascular;  Laterality: Left;  . PERIPHERAL VASCULAR CATHETERIZATION Left 03/09/2015   Procedure: A/V Shuntogram/Fistulagram;  Surgeon: Algernon Huxley, MD;  Location: [facility];  Service: Cardiovascular;  Laterality: Left;  . PERIPHERAL VASCULAR CATHETERIZATION N/A 03/09/2015   Procedure: A/V Shunt Intervention;  Surgeon: Algernon Huxley, MD;  Location: [facility];  Service: Cardiovascular;  Laterality: N/A;  . PERIPHERAL VASCULAR CATHETERIZATION Left 04/17/2015   Procedure: Upper Extremity Angiography;  Surgeon: Algernon Huxley, MD;  Location: [facility];  Service: Cardiovascular;  Laterality: Left;  . PERIPHERAL VASCULAR CATHETERIZATION  04/17/2015   Procedure: Upper Extremity Intervention;  Surgeon: Algernon Huxley, MD;  Location: [facility];  Service: Cardiovascular;;  . PERIPHERAL VASCULAR CATHETERIZATION N/A 08/10/2015   Procedure: A/V Shuntogram/Fistulagram;  Surgeon: Algernon Huxley, MD;  Location: [facility];  Service: Cardiovascular;  Laterality: N/A;  . PERIPHERAL VASCULAR CATHETERIZATION N/A 08/10/2015   Procedure: A/V Shunt Intervention;  Surgeon: Algernon Huxley, MD;  Location: [facility];  Service: Cardiovascular;  Laterality: N/A;  . PERIPHERAL VASCULAR CATHETERIZATION Left 04/11/2016   Procedure: A/V  Shuntogram/Fistulagram;  Surgeon: Algernon Huxley, MD;  Location: [facility];  Service: Cardiovascular;  Laterality: Left;  . TEE WITHOUT CARDIOVERSION N/A 11/16/2017   Procedure: TRANSESOPHAGEAL ECHOCARDIOGRAM (TEE);  Surgeon: Minna Merritts, MD;  Location: [facility];  Service: Cardiovascular;  Laterality: N/A;    Current Outpatient Medications:  .  ACCU-CHEK AVIVA PLUS test strip, , Disp: , Rfl:  .  Accu-Chek Softclix Lancets lancets, , Disp: , Rfl:  .  aspirin EC 325 MG tablet, Take 325 mg by mouth daily., Disp: , Rfl:  .  atorvastatin (LIPITOR) 80 MG tablet, Take 80 mg by mouth every evening. , Disp: , Rfl:  .  Blood Glucose Monitoring Suppl (ACCU-CHEK AVIVA PLUS) w/Device KIT, , Disp: , Rfl:  .  Blood Glucose Monitoring Suppl (FIFTY50 GLUCOSE METER 2.0) w/Device KIT, Accu-Check Aviva. Use as instructed for bid glucose monitoring, Disp: , Rfl:  .  calcium acetate (PHOSLO) 667 MG capsule, Take 3 capsules (2,001 mg total) by mouth 3 (three) times daily with meals., Disp:  , Rfl:  .  cholecalciferol (VITAMIN D) 25 MCG tablet, Take 1 tablet (1,000 Units total) by mouth daily., Disp:  , Rfl:  .  clopidogrel (PLAVIX) 75 MG tablet, TAKE ONE TABLET BY MOUTH ONCE DAILY (Patient taking differently: Take 75 mg by mouth daily. ), Disp: 30 tablet, Rfl: 5 .  fluticasone (FLONASE) 50 MCG/ACT nasal spray, 2 sprays  by Each Nare route daily. Prior to CPAP, Disp: , Rfl:  .  gabapentin (NEURONTIN) 100 MG capsule, Take 2 capsules (200 mg total) by mouth 3 (three) times daily. (Patient taking differently: Take 100 mg by mouth 3 (three) times daily. ), Disp: 90 capsule, Rfl: 0 .  glucose blood (ACCU-CHEK AVIVA PLUS) test strip, Use as directed for twice/day glucose monitoring, Disp: , Rfl:  .  guaiFENesin-dextromethorphan (ROBITUSSIN DM) 100-10 MG/5ML syrup, Take 10 mLs by mouth every 4 (four) hours as needed for cough., Disp: 118 mL, Rfl: 0 .  hydrocortisone (ANUSOL-HC) 2.5 % rectal cream, Place 1  application rectally 2 (two) times daily., Disp: 30 g, Rfl: 1 .  insulin NPH-regular Human (NOVOLIN 70/30) (70-30) 100 UNIT/ML injection, Inject 25 Units into the skin 2 (two) times daily with a meal. Except dialysis days. Do not take on Tuesdays, Thursdays, and Saturdays., Disp: 10 mL, Rfl: 11 .  Insulin Syringe-Needle U-100 (BD VEO INSULIN SYRINGE U/F) 31G X 15/64" 0.5 ML MISC, USE AS DIRECTED TWICE DAILY, Disp: , Rfl:  .  Ipratropium-Albuterol (COMBIVENT) 20-100 MCG/ACT AERS respimat, Inhale 1 puff into the lungs every 6 (six) hours., Disp: 4 g, Rfl: 0 .  levETIRAcetam (KEPPRA) 1000 MG tablet, Take 1 tablet (1,000 mg total) by mouth daily., Disp: 30 tablet, Rfl: 0 .  levETIRAcetam (KEPPRA) 250 MG tablet, Take 1 tablet (250 mg total) by mouth 3 (three) times a week. Take after dialysis on Monday, wed and Friday in addition to regular dose., Disp: 15 tablet, Rfl: 0 .  lidocaine-prilocaine (EMLA) cream, Apply 1 application topically as needed (for port access). , Disp: , Rfl:  .  losartan (COZAAR) 100 MG tablet, Take 100 mg by mouth daily. Takes in the morning, Disp: , Rfl:  .  metoprolol succinate (TOPROL-XL) 100 MG 24 hr tablet, Take 100 mg by mouth daily. Take with or immediately following a meal. , Disp: , Rfl:  .  omeprazole (PRILOSEC) 20 MG capsule, Take 20 mg by mouth 2 (two) times daily. , Disp: , Rfl:  .  torsemide (DEMADEX) 100 MG tablet, Take 100 mg by mouth daily., Disp: , Rfl:    Family History  Problem Relation Age of Onset  . Breast cancer Father 60  . Stroke Mother   . Heart attack Mother   . Heart Problems Sister   . Breast cancer Cousin 90       1 st cousin. Maternal      Social History   Tobacco Use  . Smoking status: Former Smoker    Packs/day: 0.00    Types: Cigarettes  . Smokeless tobacco: Never Used  Substance Use Topics  . Alcohol use: No  . Drug use: No    Allergies as of 09/09/2019 - Review Complete 09/09/2019  Allergen Reaction Noted  . Hydrocodone   02/13/2019  . Tape Itching 09/16/2016  . Tapentadol Itching 09/16/2016    Review of Systems:    All systems reviewed and negative except where noted in HPI.   Physical Exam:  BP (!) 153/73 (BP Location: Left Arm, Patient Position: Sitting, Cuff Size: Large)   Pulse 82   Temp 98.5 F (36.9 C)   Resp 17   Ht '5\' 9"'  (1.753 m)   Wt 295 lb (133.8 kg)   BMI 43.56 kg/m  No LMP recorded. Patient is postmenopausal.  General:   Alert, morbidly obese, pleasant and cooperative in NAD Head:  Normocephalic and atraumatic. Eyes:  Sclera clear, no icterus.  Conjunctiva pale Ears:  Normal auditory acuity. Nose:  No deformity, discharge, or lesions. Mouth:  No deformity or lesions,oropharynx pink & moist. Neck:  Supple; no masses or thyromegaly. Lungs:  Respirations even and unlabored.  Clear throughout to auscultation.   No wheezes, crackles, or rhonchi. No acute distress. Heart:  Regular rate and rhythm; no murmurs, clicks, rubs, or gallops. Abdomen:  Normal bowel sounds. Soft, obese, non-tender and non-distended without masses, hepatosplenomegaly or hernias noted.  No guarding or rebound tenderness.   Rectal: Perianal skin tag, palpable external hemorrhoids on digital rectal exam, no gross blood on exam Msk:  Symmetrical without gross deformities. Good, equal movement & strength bilaterally. Pulses:  Normal pulses noted. Extremities:  No clubbing or edema.  No cyanosis. Neurologic:  Alert and oriented x3;  grossly normal neurologically. Skin:  Intact without significant lesions or rashes. No jaundice. Psych:  Alert and cooperative. Normal mood and affect.  Imaging Studies: Reviewed  Assessment and Plan:   Cynthia Dean is a 69 y.o. female with morbid obesity, metabolic syndrome, end-stage renal disease on hemodialysis is referred for rectal bleeding and anemia  Rectal bleeding: Clinically insignificant Patient underwent colonoscopy in 01/2019 which did not reveal any  significant pathology. Etiology, secondary to bleeding from hemorrhoids Recommend trial of Anusol cream per rectum 2 times a day for 2 weeks Patient is on high-dose aspirin and Plavix which can increase her risk for hemorrhoidal bleeding and therefore, recommend to decrease aspirin to 81 mg daily if medically appropriate Advised her about toilet hygiene, avoid pushing or straining, not to spend more than 5 minutes on the toilet Do not recommend hemorrhoid ligation due to high risk for post banding ulcer bleeding as she is on DAPT  Normocytic anemia, iron studies consistent with anemia of chronic disease Patient has normal B12 levels as well However, her hemoglobin has been downtrending.  She may have chronic occult blood loss from high-dose aspirin use which can lead to small bowel erosions.  EGD in 01/2019 did not reveal peptic ulcer disease.  Therefore, recommend to decrease aspirin dose to 81 mg daily if medically appropriate Continue omeprazole 20 mg 2 times a day long-term Follow-up with nephrology for anemia of chronic disease  Follow up in 1 month   Cephas Darby, MD

## 2019-09-09 NOTE — Progress Notes (Signed)
Prescription has been sent to pharmacy.

## 2019-10-03 ENCOUNTER — Ambulatory Visit (INDEPENDENT_AMBULATORY_CARE_PROVIDER_SITE_OTHER): Payer: Medicare HMO | Admitting: *Deleted

## 2019-10-03 DIAGNOSIS — I6310 Cerebral infarction due to embolism of unspecified precerebral artery: Secondary | ICD-10-CM | POA: Diagnosis not present

## 2019-10-03 LAB — CUP PACEART REMOTE DEVICE CHECK
Date Time Interrogation Session: 20210311002805
Implantable Pulse Generator Implant Date: 20190425

## 2019-10-04 NOTE — Progress Notes (Signed)
ILR Remote 

## 2019-10-16 ENCOUNTER — Ambulatory Visit: Payer: Medicare HMO | Admitting: Gastroenterology

## 2019-10-16 ENCOUNTER — Encounter: Payer: Self-pay | Admitting: Gastroenterology

## 2019-10-18 ENCOUNTER — Ambulatory Visit
Admission: EM | Admit: 2019-10-18 | Discharge: 2019-10-18 | Disposition: A | Discharge status: Home / Self Care | Payer: Medicare HMO | Source: Home / Self Care

## 2019-10-18 ENCOUNTER — Emergency Department
Admission: EM | Admit: 2019-10-18 | Discharge: 2019-10-18 | Disposition: A | Discharge status: Home / Self Care | Payer: Medicare HMO | Attending: Emergency Medicine | Admitting: Emergency Medicine

## 2019-10-18 ENCOUNTER — Emergency Department: Payer: Medicare HMO

## 2019-10-18 ENCOUNTER — Ambulatory Visit (INDEPENDENT_AMBULATORY_CARE_PROVIDER_SITE_OTHER): Payer: Medicare HMO

## 2019-10-18 ENCOUNTER — Other Ambulatory Visit: Payer: Self-pay

## 2019-10-18 ENCOUNTER — Encounter: Payer: Self-pay | Admitting: Urgent Care

## 2019-10-18 DIAGNOSIS — Z87891 Personal history of nicotine dependence: Secondary | ICD-10-CM | POA: Diagnosis not present

## 2019-10-18 DIAGNOSIS — I2510 Atherosclerotic heart disease of native coronary artery without angina pectoris: Secondary | ICD-10-CM | POA: Diagnosis not present

## 2019-10-18 DIAGNOSIS — R4182 Altered mental status, unspecified: Secondary | ICD-10-CM | POA: Insufficient documentation

## 2019-10-18 DIAGNOSIS — N390 Urinary tract infection, site not specified: Secondary | ICD-10-CM | POA: Diagnosis not present

## 2019-10-18 DIAGNOSIS — Z992 Dependence on renal dialysis: Secondary | ICD-10-CM | POA: Diagnosis not present

## 2019-10-18 DIAGNOSIS — R531 Weakness: Secondary | ICD-10-CM | POA: Diagnosis not present

## 2019-10-18 DIAGNOSIS — R109 Unspecified abdominal pain: Secondary | ICD-10-CM

## 2019-10-18 DIAGNOSIS — Z7982 Long term (current) use of aspirin: Secondary | ICD-10-CM | POA: Insufficient documentation

## 2019-10-18 DIAGNOSIS — Z794 Long term (current) use of insulin: Secondary | ICD-10-CM | POA: Diagnosis not present

## 2019-10-18 DIAGNOSIS — R05 Cough: Secondary | ICD-10-CM | POA: Diagnosis not present

## 2019-10-18 DIAGNOSIS — Z79899 Other long term (current) drug therapy: Secondary | ICD-10-CM | POA: Diagnosis not present

## 2019-10-18 DIAGNOSIS — R5383 Other fatigue: Secondary | ICD-10-CM

## 2019-10-18 DIAGNOSIS — E1122 Type 2 diabetes mellitus with diabetic chronic kidney disease: Secondary | ICD-10-CM | POA: Diagnosis not present

## 2019-10-18 DIAGNOSIS — R059 Cough, unspecified: Secondary | ICD-10-CM

## 2019-10-18 DIAGNOSIS — I120 Hypertensive chronic kidney disease with stage 5 chronic kidney disease or end stage renal disease: Secondary | ICD-10-CM | POA: Diagnosis not present

## 2019-10-18 DIAGNOSIS — N186 End stage renal disease: Secondary | ICD-10-CM | POA: Insufficient documentation

## 2019-10-18 DIAGNOSIS — R1084 Generalized abdominal pain: Secondary | ICD-10-CM | POA: Insufficient documentation

## 2019-10-18 DIAGNOSIS — Z7901 Long term (current) use of anticoagulants: Secondary | ICD-10-CM | POA: Insufficient documentation

## 2019-10-18 DIAGNOSIS — R112 Nausea with vomiting, unspecified: Secondary | ICD-10-CM

## 2019-10-18 LAB — COMPREHENSIVE METABOLIC PANEL
ALT: 13 U/L (ref 0–44)
AST: 14 U/L — ABNORMAL LOW (ref 15–41)
Albumin: 3.9 g/dL (ref 3.5–5.0)
Alkaline Phosphatase: 81 U/L (ref 38–126)
Anion gap: 19 — ABNORMAL HIGH (ref 5–15)
BUN: 54 mg/dL — ABNORMAL HIGH (ref 8–23)
CO2: 23 mmol/L (ref 22–32)
Calcium: 9.4 mg/dL (ref 8.9–10.3)
Chloride: 92 mmol/L — ABNORMAL LOW (ref 98–111)
Creatinine, Ser: 10.92 mg/dL — ABNORMAL HIGH (ref 0.44–1.00)
GFR calc Af Amer: 4 mL/min — ABNORMAL LOW (ref >60)
GFR calc non Af Amer: 3 mL/min — ABNORMAL LOW (ref >60)
Glucose, Bld: 188 mg/dL — ABNORMAL HIGH (ref 70–99)
Potassium: 5.4 mmol/L — ABNORMAL HIGH (ref 3.5–5.1)
Sodium: 134 mmol/L — ABNORMAL LOW (ref 135–145)
Total Bilirubin: 0.8 mg/dL (ref 0.3–1.2)
Total Protein: 8.4 g/dL — ABNORMAL HIGH (ref 6.5–8.1)

## 2019-10-18 LAB — CBC WITH DIFFERENTIAL/PLATELET
Abs Immature Granulocytes: 0.05 10*3/uL (ref 0.00–0.07)
Basophils Absolute: 0.1 10*3/uL (ref 0.0–0.1)
Basophils Relative: 1 %
Eosinophils Absolute: 0 10*3/uL (ref 0.0–0.5)
Eosinophils Relative: 0 %
HCT: 32.2 % — ABNORMAL LOW (ref 36.0–46.0)
Hemoglobin: 9.9 g/dL — ABNORMAL LOW (ref 12.0–15.0)
Immature Granulocytes: 0 %
Lymphocytes Relative: 13 %
Lymphs Abs: 1.5 10*3/uL (ref 0.7–4.0)
MCH: 27.5 pg (ref 26.0–34.0)
MCHC: 30.7 g/dL (ref 30.0–36.0)
MCV: 89.4 fL (ref 80.0–100.0)
Monocytes Absolute: 1 10*3/uL (ref 0.1–1.0)
Monocytes Relative: 9 %
Neutro Abs: 8.8 10*3/uL — ABNORMAL HIGH (ref 1.7–7.7)
Neutrophils Relative %: 77 %
Platelets: 268 10*3/uL (ref 150–400)
RBC: 3.6 MIL/uL — ABNORMAL LOW (ref 3.87–5.11)
RDW: 16.3 % — ABNORMAL HIGH (ref 11.5–15.5)
WBC: 11.4 10*3/uL — ABNORMAL HIGH (ref 4.0–10.5)
nRBC: 0 % (ref 0.0–0.2)

## 2019-10-18 LAB — URINALYSIS, COMPLETE (UACMP) WITH MICROSCOPIC
Bilirubin Urine: NEGATIVE
Glucose, UA: 50 mg/dL — AB
Hgb urine dipstick: NEGATIVE
Ketones, ur: NEGATIVE mg/dL
Nitrite: NEGATIVE
Protein, ur: >=300 mg/dL — AB
Specific Gravity, Urine: 1.01 (ref 1.005–1.030)
pH: 8 (ref 5.0–8.0)

## 2019-10-18 LAB — TROPONIN I (HIGH SENSITIVITY): Troponin I (High Sensitivity): 15 ng/L (ref <18)

## 2019-10-18 MED ORDER — DOCUSATE SODIUM 100 MG PO CAPS: 100.0000 mg | ORAL_CAPSULE | Freq: Two times a day (BID) | ORAL | 2 refills | Status: AC

## 2019-10-18 MED ORDER — OXYCODONE-ACETAMINOPHEN 5-325 MG PO TABS: 1.0000 | ORAL_TABLET | ORAL | 0 refills | Status: DC | PRN

## 2019-10-18 MED ORDER — OXYCODONE-ACETAMINOPHEN 5-325 MG PO TABS
1.0000 | ORAL_TABLET | Freq: Once | ORAL | Status: AC
  Administered 2019-10-18: 1 via ORAL
  Filled 2019-10-18: qty 1

## 2019-10-18 MED ORDER — CEPHALEXIN 500 MG PO CAPS
500.0000 mg | ORAL_CAPSULE | Freq: Once | ORAL | Status: AC
  Administered 2019-10-18: 500 mg via ORAL
  Filled 2019-10-18: qty 1

## 2019-10-18 MED ORDER — CEPHALEXIN 500 MG PO CAPS: 500.0000 mg | ORAL_CAPSULE | Freq: Two times a day (BID) | ORAL | 0 refills | Status: DC

## 2019-10-18 NOTE — ED Notes (Signed)
Per patient and friend, she did not take her BP meds this AM

## 2019-10-18 NOTE — ED Provider Notes (Signed)
[facility] Emergency Department Provider Note  Time seen: 9:08 PM  I have reviewed the triage vital signs and the nursing notes.   HISTORY  Chief Complaint Altered Mental Status   HPI Cynthia Dean is a 69 y.o. female with a past medical history of multiple CVAs, COPD, CAD, diabetes, hypertension, hyperlipidemia, presents to the emergency department for abdominal pain and possible altered mental status.   According to nurse report the patient was noted at urgent care today to be experiencing some confusion and difficulty with speech.  They were unable to reach family to confirm this is her baseline, they also noted the patient to be hypoxic to 85% on room air however she is normally on 2 L.  Patient's family is here with the patient in the emergency department, they state the patient is at her baseline, her speech is her baseline, but only recently went to urgent care is because she has been experiencing 1 week of abdominal pain.  States her last bowel movement was approximately 3 or 4 days ago which she and family relate to being the cause of the abdominal pain.  Denies any vomiting, denies any fever.  Does admit decreased oral intake due to abdominal pain.  Past Medical History:  Diagnosis Date  . Acute on chronic respiratory failure with hypoxia ([place]) 07/30/2019  . Acute respiratory failure with hypoxia ([place]) 12/31/2018  . Carotid arterial disease ([place])    a. 02/2018 < bilat ICA stenosis.  [name] COPD (chronic obstructive pulmonary disease) ([place])   . Coronary artery disease    a. 11/2015 MV: EF 57%, no ischemia/infarct.  . Cryptogenic stroke ([place])    a. 10/2017 s/p implantable loop recorder (no Afib to date).  . Diabetes mellitus without complication ([place])   . Diarrhea   . ESRD on dialysis ([place])   . GI bleed    a. 01/2019 EGD/Colonoscopy: Mild gastritis. Colitis.  [name] Heart murmur    a. 12/2018 Echo: EF 55-60%, impaired relaxation. Mod dil LA. Mildly dil RA. Mild  Ca2+ of AoV.  [name] Hematemesis 06/27/2017  . History of 2019 novel coronavirus disease (COVID-19) 07/30/2019  . Hyperkalemia   . Hyperlipidemia   . Hypertension   . Intractable nausea and vomiting 08/08/2015  . Nausea & vomiting 05/30/2018  . Nausea and vomiting 05/29/2018  . Pneumonia due to COVID-19 virus     Patient Active Problem List   Diagnosis Date Noted  . Acute metabolic encephalopathy   . Acute on chronic diastolic CHF (congestive heart failure) ([place])   . Rectal bleed 02/13/2019  . Aphasia as late effect of cerebrovascular accident (CVA) 05/01/2018  . Seizure ([place]) 03/14/2018  . Stroke ([place]) 03/07/2018  . Slurred speech 11/14/2017  . 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[place]) 11/12/2017  . CAD (coronary artery disease) 11/12/2017  . COPD (chronic obstructive pulmonary disease) ([place]) 11/12/2017  . ESRD on dialysis ([place]) 11/12/2017  . CVA (cerebral vascular accident) ([place]) 04/14/2016  . Essential hypertension 11/27/2015  . Hyperlipidemia 11/27/2015  . Prolonged Q-T interval on ECG 11/26/2015  . Aphasia complicating stroke 03/47/4259  . Cervical spine arthritis 07/27/2015  . Diabetic retinopathy without macular edema associated with type 2 diabetes mellitus ([place]) 07/27/2015  . Osteoarthritis of spine with radiculopathy, lumbar region 07/27/2015  . Obesity, Class III, BMI 40-49.9 (morbid obesity) ([place]) 07/27/2015  . Vitamin D deficiency 01/21/2011  . Diastolic heart failure ([place]) 10/27/2010    Past Surgical History:  Procedure Laterality Date  .  A/V FISTULAGRAM Left 12/20/2016   Procedure: A/V Fistulagram;  Surgeon: Katha Cabal, MD;  Location: [place] [facility];  Service: Cardiovascular;  Laterality: Left;  . A/V SHUNT INTERVENTION N/A 12/20/2016   Procedure: A/V Shunt Intervention;  Surgeon: Katha Cabal, MD;  Location: [place] [facility];  Service: Cardiovascular;  Laterality: N/A;  . A/V SHUNTOGRAM Left 09/11/2017   Procedure: A/V  SHUNTOGRAM;  Surgeon: Algernon Huxley, MD;  Location: [place] [facility];  Service: Cardiovascular;  Laterality: Left;  . BREAST BIOPSY Bilateral 07/19/00   neg  . COLONOSCOPY WITH PROPOFOL N/A 02/16/2019   Procedure: COLONOSCOPY WITH PROPOFOL;  Surgeon: Toledo, Benay Pike, MD;  Location: [facility];  Service: Gastroenterology;  Laterality: N/A;  . ESOPHAGOGASTRODUODENOSCOPY (EGD) WITH PROPOFOL N/A 02/16/2019   Procedure: ESOPHAGOGASTRODUODENOSCOPY (EGD) WITH PROPOFOL;  Surgeon: Toledo, Benay Pike, MD;  Location: [facility];  Service: Gastroenterology;  Laterality: N/A;  . LOOP RECORDER INSERTION N/A 11/16/2017   Procedure: LOOP RECORDER INSERTION;  Surgeon: Deboraha Sprang, MD;  Location: [place] [facility];  Service: Cardiovascular;  Laterality: N/A;  . PERIPHERAL VASCULAR CATHETERIZATION Left 02/02/2015   Procedure: A/V Shuntogram/Fistulagram;  Surgeon: Algernon Huxley, MD;  Location: [place] [facility];  Service: Cardiovascular;  Laterality: Left;  . PERIPHERAL VASCULAR CATHETERIZATION Left 02/02/2015   Procedure: A/V Shunt Intervention;  Surgeon: Algernon Huxley, MD;  Location: [place] [facility];  Service: Cardiovascular;  Laterality: Left;  . PERIPHERAL VASCULAR CATHETERIZATION Left 03/09/2015   Procedure: A/V Shuntogram/Fistulagram;  Surgeon: Algernon Huxley, MD;  Location: [place] [facility];  Service: Cardiovascular;  Laterality: Left;  . PERIPHERAL VASCULAR CATHETERIZATION N/A 03/09/2015   Procedure: A/V Shunt Intervention;  Surgeon: Algernon Huxley, MD;  Location: [place] [facility];  Service: Cardiovascular;  Laterality: N/A;  . PERIPHERAL VASCULAR CATHETERIZATION Left 04/17/2015   Procedure: Upper Extremity Angiography;  Surgeon: Algernon Huxley, MD;  Location: [place] [facility];  Service: Cardiovascular;  Laterality: Left;  . PERIPHERAL VASCULAR CATHETERIZATION  04/17/2015   Procedure: Upper Extremity Intervention;  Surgeon: Algernon Huxley, MD;  Location: [place] [facility];  Service:  Cardiovascular;;  . PERIPHERAL VASCULAR CATHETERIZATION N/A 08/10/2015   Procedure: A/V Shuntogram/Fistulagram;  Surgeon: Algernon Huxley, MD;  Location: [place] [facility];  Service: Cardiovascular;  Laterality: N/A;  . PERIPHERAL VASCULAR CATHETERIZATION N/A 08/10/2015   Procedure: A/V Shunt Intervention;  Surgeon: Algernon Huxley, MD;  Location: [place] [facility];  Service: Cardiovascular;  Laterality: N/A;  . PERIPHERAL VASCULAR CATHETERIZATION Left 04/11/2016   Procedure: A/V Shuntogram/Fistulagram;  Surgeon: Algernon Huxley, MD;  Location: [place] [facility];  Service: Cardiovascular;  Laterality: Left;  . TEE WITHOUT CARDIOVERSION N/A 11/16/2017   Procedure: TRANSESOPHAGEAL ECHOCARDIOGRAM (TEE);  Surgeon: Minna Merritts, MD;  Location: [facility];  Service: Cardiovascular;  Laterality: N/A;    Prior to Admission medications   Medication Sig Start Date End Date Taking? Authorizing Provider  ACCU-CHEK AVIVA PLUS test strip  08/19/19   [provider]  Accu-Chek Softclix Lancets lancets  08/19/19   [provider]  aspirin EC 325 MG tablet Take 325 mg by mouth daily. 05/28/19   [provider]  atorvastatin (LIPITOR) 80 MG tablet Take 80 mg by mouth every evening.     [provider]  Blood Glucose Monitoring Suppl (ACCU-CHEK AVIVA PLUS) w/Device KIT  08/19/19   [provider]  Blood Glucose Monitoring Suppl (FIFTY50 GLUCOSE METER 2.0) w/Device KIT Accu-Check  Aviva. Use as instructed for bid glucose monitoring 08/18/19 08/17/20  [provider]  calcium acetate (PHOSLO) 667 MG capsule Take 3 capsules (2,001 mg total) by mouth 3 (three) times daily with meals. 08/11/19   Ezekiel Slocumb, DO  cholecalciferol (VITAMIN D) 25 MCG tablet Take 1 tablet (1,000 Units total) by mouth daily. 08/11/19   Ezekiel Slocumb, DO  clopidogrel (PLAVIX) 75 MG tablet TAKE ONE TABLET BY MOUTH ONCE DAILY Patient taking differently: Take 75 mg by mouth daily.  06/14/16    Stegmayer, Joelene Millin A, PA-C  fluticasone (FLONASE) 50 MCG/ACT nasal spray 2 sprays by Each Nare route daily. Prior to CPAP 08/07/18   [provider]  gabapentin (NEURONTIN) 100 MG capsule Take 2 capsules (200 mg total) by mouth 3 (three) times daily. Patient taking differently: Take 100 mg by mouth 3 (three) times daily.  03/28/19 03/27/20  Rudene Re, MD  glucose blood (ACCU-CHEK AVIVA PLUS) test strip Use as directed for twice/day glucose monitoring 08/18/19   [provider]  hydrocortisone (ANUSOL-HC) 2.5 % rectal cream Place 1 application rectally 2 (two) times daily. 09/09/19   Lin Landsman, MD  insulin NPH-regular Human (NOVOLIN 70/30) (70-30) 100 UNIT/ML injection Inject 25 Units into the skin 2 (two) times daily with a meal. Except dialysis days. Do not take on Tuesdays, Thursdays, and Saturdays. 11/17/17   Gouru, Illene Silver, MD  Insulin Syringe-Needle U-100 (BD VEO INSULIN SYRINGE U/F) 31G X 15/64" 0.5 ML MISC USE AS DIRECTED TWICE DAILY 08/18/19   [provider]  Ipratropium-Albuterol (COMBIVENT) 20-100 MCG/ACT AERS respimat Inhale 1 puff into the lungs every 6 (six) hours. 08/11/19   Ezekiel Slocumb, DO  levETIRAcetam (KEPPRA) 1000 MG tablet Take 1 tablet (1,000 mg total) by mouth daily. 03/15/18   Vaughan Basta, MD  lidocaine-prilocaine (EMLA) cream Apply 1 application topically as needed (for port access).     [provider]  losartan (COZAAR) 100 MG tablet Take 100 mg by mouth daily. Takes in the morning 09/12/17   [provider]  metoprolol succinate (TOPROL-XL) 100 MG 24 hr tablet Take 100 mg by mouth daily. Take with or immediately following a meal.     [provider]  omeprazole (PRILOSEC) 20 MG capsule Take 20 mg by mouth 2 (two) times daily.     [provider]  torsemide (DEMADEX) 100 MG tablet Take 100 mg by mouth daily. 10/10/17   [provider]    Allergies  Allergen Reactions  .  Hydrocodone     Intolerant more than allergic  . Tape Itching    Skin Dermatitis/itching (tape adhesive) Skin Dermatitis/itching (tape adhesive)  . Tapentadol Itching    Skin Dermatitis/itching (tape adhesive) Skin Dermatitis/itching (tape adhesive)     Family History  Problem Relation Age of Onset  . Breast cancer Father 22  . Stroke Mother   . Heart attack Mother   . Heart Problems Sister   . Breast cancer Cousin 56       1 st cousin. Maternal     Social History Social History   Tobacco Use  . Smoking status: Former Smoker    Packs/day: 0.00    Types: Cigarettes  . Smokeless tobacco: Never Used  Substance Use Topics  . Alcohol use: No  . Drug use: No    Review of Systems per patient and family. Constitutional: Negative for fever. Cardiovascular: Negative for chest pain. Respiratory: Negative for shortness of breath. Gastrointestinal: Diffuse abdominal pain.  Negative  for vomiting or diarrhea.  3 days since last bowel movement. Genitourinary: Negative for urinary compaints Musculoskeletal: Negative for musculoskeletal complaints Neurological: Negative for headache All other ROS negative  ____________________________________________   PHYSICAL EXAM:  VITAL SIGNS: ED Triage Vitals [10/18/19 1506]  Enc Vitals Group     BP (!) 201/75     Pulse Rate 81     Resp 18     Temp 98.9 F (37.2 C)     Temp Source Oral     SpO2 91 %     Weight 300 lb (136.1 kg)     Height '5\' 9"'  (1.753 m)     Head Circumference      Peak Flow      Pain Score 0     Pain Loc      Pain Edu?      Excl. in [place]?    Constitutional: Awake and alert, does have mild speech difficulties. Eyes: Normal exam ENT      Head: Normocephalic and atraumatic.      Mouth/Throat: Mucous membranes are moist. Cardiovascular: Normal rate, regular rhythm. Respiratory: Normal respiratory effort without tachypnea nor retractions. Breath sounds are clear  Gastrointestinal: Soft and nontender. No  distention. Musculoskeletal: Nontender with normal range of motion in all extremities.  Neurologic:  Normal speech and language. No gross focal neurologic deficits  Skin:  Skin is warm, dry and intact.  Psychiatric: Mood and affect are normal.   ____________________________________________   RADIOLOGY  CT scan of the head shows old infarcts no acute abnormality. CT scan shows bilateral perinephric stranding which could be acute versus chronic.  ____________________________________________   INITIAL IMPRESSION / ASSESSMENT AND PLAN / ED COURSE  Pertinent labs & imaging results that were available during my care of the patient were reviewed by me and considered in my medical decision making (see chart for details).   Patient presents emergency department for abdominal discomfort, sent from urgent care for speech difficulties and possible confusion.  Family is here with the patient who states this is her baseline.  The only reason they came to urgent care/ED tonight was for abdominal pain.  Patient has an overall benign abdominal exam, no reaction to palpation.  Patient had lab work performed today showing an elevated creatinine 10.9 with a slightly elevated potassium 5.4.  White count of 11.4.  Patient states diffuse abdominal pain we will obtain a CT scan of the abdomen to further evaluate.  CT scan of the head showed no acute findings, old strokes.  Patient and family agreeable to plan.  CT abdomen shows bilateral perinephric stranding acute versus chronic.  Patient did not have a urinalysis sample performed at urgent care earlier today.  We will obtain a urinalysis sample.  We will dose a small dose of pain medication for the patient's abdominal pain CT scan otherwise shows no acute abnormalities to explain the patient's symptoms.  Urinalysis shows questionable UTI.  Discharged with antibiotics as a precaution.  Cynthia Dean was evaluated in Emergency Department on 10/18/2019 for  the symptoms described in the history of present illness. She was evaluated in the context of the global COVID-19 pandemic, which necessitated consideration that the patient might be at risk for infection with the SARS-CoV-2 virus that causes COVID-19. Institutional protocols and algorithms that pertain to the evaluation of patients at risk for COVID-19 are in a state of rapid change based on information released by regulatory bodies including the CDC and federal and state organizations. These policies  and algorithms were followed during the patient's care in the ED.  ____________________________________________   FINAL CLINICAL IMPRESSION(S) / ED DIAGNOSES  Abdominal pain    Harvest Dark, MD 10/21/19 1437

## 2019-10-18 NOTE — ED Triage Notes (Signed)
Pt presents with c/o emesis, productive cough, fatigue, decreased oral intake, abdominal pain and chills/sweats for the past 3 days. Daughter reports increased mental issues and disorientation. Pt denies any urinary symptoms. Pt recently hospitalized with COVID, but had seemed to fully recover until these symptoms started. Pt also received her 2nd COVID vaccine yesterday.

## 2019-10-18 NOTE — ED Notes (Signed)
[place] signature pad not working on [employer], attempted to print out without success. Patient and friend verbalize understanding of discharge instructions and have no further concerns

## 2019-10-18 NOTE — ED Notes (Signed)
Patient is complaining of belly pain, notified EDP

## 2019-10-18 NOTE — ED Provider Notes (Signed)
[place], [place]   Name: Cynthia Dean DOB: 29-Apr-1951 MRN: [id number] CSN: [id number] PCP: Lowella Bandy, MD  Arrival date and time:  10/18/19 1216  Chief Complaint:  Cough and Emesis  NOTE: Prior to seeing the patient today, I have reviewed the triage nursing documentation and vital signs. Clinical staff has updated patient's PMH/PSHx, current medication list, and drug allergies/intolerances to ensure comprehensive history available to assist in medical decision making.   History:   HPI: Cynthia Dean is a 69 y.o. female who presents today with complaints of weakness, fatigue, cough, and diffuse abdominal pain. She has not experienced any fevers, however has had alternating episodes of chills and diaphoresis. Patient presents to clinic with an elevated temperature of 99.2 orally. Daughter concerned because patient has had nausea and vomiting for the last 3 days. No diarrhea; LNBM was 3-4 days ago. Her intake has been poor overall. Daughter notes that she has not been eating and drinking due to her symptoms, some of which are felt to be anticipatory at this point. Patient presents with her daughter who provides most of the HPI today. Patient is reported to be increasingly confused. Daughter states, "she got up and tried to put on her boyfriend's pants this morning to go to work. This woman has not worked in 64 years". She is reported to have dementia; daughter states, "it is acting up since she got sick". 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 Daughter reports that patient seems to be searching for words more over the last few days.  Patient is on HD, with her last treatment being on Wednesday of this week. Daughter notes that patient is scheduled to be dialyzed again tomorrow. Patient presents to clinic short of breath with a SPO2 of 85% on RA. Patient is normally on 2L/[place] supplemental oxygen ATC, however daughter notes that they were not provided  with portable oxygen. Saturations improved to 95% on supplemental oxygen. Patient presents to clinic pale in color.   Past Medical History:  Diagnosis Date  . Acute on chronic respiratory failure with hypoxia ([place]) 07/30/2019  . Acute respiratory failure with hypoxia ([place]) 12/31/2018  . Carotid arterial disease ([place])    a. 02/2018 < bilat ICA stenosis.  [name] COPD (chronic obstructive pulmonary disease) ([place])   . Coronary artery disease    a. 11/2015 MV: EF 57%, no ischemia/infarct.  . Cryptogenic stroke ([place])    a. 10/2017 s/p implantable loop recorder (no Afib to date).  . Diabetes mellitus without complication ([place])   . Diarrhea   . ESRD on dialysis ([place])   . GI bleed    a. 01/2019 EGD/Colonoscopy: Mild gastritis. Colitis.  [name] Heart murmur    a. 12/2018 Echo: EF 55-60%, impaired relaxation. Mod dil LA. Mildly dil RA. Mild Ca2+ of AoV.  [name] Hematemesis 06/27/2017  . History of 2019 novel coronavirus disease (COVID-19) 07/30/2019  . Hyperkalemia   . Hyperlipidemia   . Hypertension   . Intractable nausea and vomiting 08/08/2015  . Nausea & vomiting 05/30/2018  . Nausea and vomiting 05/29/2018  . Pneumonia due to COVID-19 virus     Past Surgical History:  Procedure Laterality Date  . A/V FISTULAGRAM Left 12/20/2016   Procedure: A/V Fistulagram;  Surgeon: Katha Cabal, MD;  Location: [place] CV LAB;  Service: Cardiovascular;  Laterality: Left;  . A/V SHUNT INTERVENTION N/A 12/20/2016   Procedure: A/V Shunt Intervention;  Surgeon: Katha Cabal, MD;  Location: [facility] INVASIVE CV  LAB;  Service: Cardiovascular;  Laterality: N/A;  . A/V SHUNTOGRAM Left 09/11/2017   Procedure: A/V SHUNTOGRAM;  Surgeon: Algernon Huxley, MD;  Location: [place] CV LAB;  Service: Cardiovascular;  Laterality: Left;  . BREAST BIOPSY Bilateral 07/19/00   neg  . COLONOSCOPY WITH PROPOFOL N/A 02/16/2019   Procedure: COLONOSCOPY WITH PROPOFOL;  Surgeon: Toledo, Benay Pike, MD;  Location: [facility] ENDOSCOPY;  Service:  Gastroenterology;  Laterality: N/A;  . ESOPHAGOGASTRODUODENOSCOPY (EGD) WITH PROPOFOL N/A 02/16/2019   Procedure: ESOPHAGOGASTRODUODENOSCOPY (EGD) WITH PROPOFOL;  Surgeon: Toledo, Benay Pike, MD;  Location: [facility] ENDOSCOPY;  Service: Gastroenterology;  Laterality: N/A;  . LOOP RECORDER INSERTION N/A 11/16/2017   Procedure: LOOP RECORDER INSERTION;  Surgeon: Deboraha Sprang, MD;  Location: [place] [facility] LAB;  Service: Cardiovascular;  Laterality: N/A;  . PERIPHERAL VASCULAR CATHETERIZATION Left 02/02/2015   Procedure: A/V Shuntogram/Fistulagram;  Surgeon: Algernon Huxley, MD;  Location: [place] CV LAB;  Service: Cardiovascular;  Laterality: Left;  . PERIPHERAL VASCULAR CATHETERIZATION Left 02/02/2015   Procedure: A/V Shunt Intervention;  Surgeon: Algernon Huxley, MD;  Location: [place] [facility] LAB;  Service: Cardiovascular;  Laterality: Left;  . PERIPHERAL VASCULAR CATHETERIZATION Left 03/09/2015   Procedure: A/V Shuntogram/Fistulagram;  Surgeon: Algernon Huxley, MD;  Location: [place] CV LAB;  Service: Cardiovascular;  Laterality: Left;  . PERIPHERAL VASCULAR CATHETERIZATION N/A 03/09/2015   Procedure: A/V Shunt Intervention;  Surgeon: Algernon Huxley, MD;  Location: [place] CV LAB;  Service: Cardiovascular;  Laterality: N/A;  . PERIPHERAL VASCULAR CATHETERIZATION Left 04/17/2015   Procedure: Upper Extremity Angiography;  Surgeon: Algernon Huxley, MD;  Location: [place] CV LAB;  Service: Cardiovascular;  Laterality: Left;  . PERIPHERAL VASCULAR CATHETERIZATION  04/17/2015   Procedure: Upper Extremity Intervention;  Surgeon: Algernon Huxley, MD;  Location: [place] CV LAB;  Service: Cardiovascular;;  . PERIPHERAL VASCULAR CATHETERIZATION N/A 08/10/2015   Procedure: A/V Shuntogram/Fistulagram;  Surgeon: Algernon Huxley, MD;  Location: [place] CV LAB;  Service: Cardiovascular;  Laterality: N/A;  . PERIPHERAL VASCULAR CATHETERIZATION N/A 08/10/2015   Procedure: A/V Shunt Intervention;  Surgeon: Algernon Huxley, MD;  Location: [place] CV LAB;  Service: Cardiovascular;  Laterality: N/A;  . PERIPHERAL VASCULAR CATHETERIZATION Left 04/11/2016   Procedure: A/V Shuntogram/Fistulagram;  Surgeon: Algernon Huxley, MD;  Location: [place] CV LAB;  Service: Cardiovascular;  Laterality: Left;  . TEE WITHOUT CARDIOVERSION N/A 11/16/2017   Procedure: TRANSESOPHAGEAL ECHOCARDIOGRAM (TEE);  Surgeon: Minna Merritts, MD;  Location: [facility] ORS;  Service: Cardiovascular;  Laterality: N/A;    Family History  Problem Relation Age of Onset  . Breast cancer Father 66  . Stroke Mother   . Heart attack Mother   . Heart Problems Sister   . Breast cancer Cousin 80       1 st cousin. Maternal     Social History   Tobacco Use  . Smoking status: Former Smoker    Packs/day: 0.00    Types: Cigarettes  . Smokeless tobacco: Never Used  Substance Use Topics  . Alcohol use: No  . Drug use: No    Patient Active Problem List   Diagnosis Date Noted  . Acute metabolic encephalopathy   . Acute on chronic diastolic CHF (congestive heart failure) ([place])   . Rectal bleed 02/13/2019  . Aphasia as late effect of cerebrovascular accident (CVA) 05/01/2018  . Seizure ([place]) 03/14/2018  . Stroke ([place]) 03/07/2018  . Slurred speech 11/14/2017  . Type 2  diabetes mellitus with ESRD (end-stage renal disease) ([place]) 11/12/2017  . CAD (coronary artery disease) 11/12/2017  . COPD (chronic obstructive pulmonary disease) ([place]) 11/12/2017  . ESRD on dialysis ([place]) 11/12/2017  . CVA (cerebral vascular accident) ([place]) 04/14/2016  . Essential hypertension 11/27/2015  . Hyperlipidemia 11/27/2015  . Prolonged Q-T interval on ECG 11/26/2015  . Aphasia complicating stroke 23/36/1224  . Cervical spine arthritis 07/27/2015  . Diabetic retinopathy without macular edema associated with type 2 diabetes mellitus ([place]) 07/27/2015  . Osteoarthritis of spine with radiculopathy, lumbar region 07/27/2015  . Obesity, Class III, BMI 40-49.9  (morbid obesity) ([place]) 07/27/2015  . Vitamin D deficiency 01/21/2011  . Diastolic heart failure ([place]) 10/27/2010    Home Medications:    Current Meds  Medication Sig  . ACCU-CHEK AVIVA PLUS test strip   . Accu-Chek Softclix Lancets lancets   . aspirin EC 325 MG tablet Take 325 mg by mouth daily.  [name] atorvastatin (LIPITOR) 80 MG tablet Take 80 mg by mouth every evening.   . Blood Glucose Monitoring Suppl (ACCU-CHEK AVIVA PLUS) w/Device KIT   . Blood Glucose Monitoring Suppl (FIFTY50 GLUCOSE METER 2.0) w/Device KIT Accu-Check Aviva. Use as instructed for bid glucose monitoring  . calcium acetate (PHOSLO) 667 MG capsule Take 3 capsules (2,001 mg total) by mouth 3 (three) times daily with meals.  . cholecalciferol (VITAMIN D) 25 MCG tablet Take 1 tablet (1,000 Units total) by mouth daily.  . clopidogrel (PLAVIX) 75 MG tablet TAKE ONE TABLET BY MOUTH ONCE DAILY (Patient taking differently: Take 75 mg by mouth daily. )  . fluticasone (FLONASE) 50 MCG/ACT nasal spray 2 sprays by Each Nare route daily. Prior to CPAP  . gabapentin (NEURONTIN) 100 MG capsule Take 2 capsules (200 mg total) by mouth 3 (three) times daily. (Patient taking differently: Take 100 mg by mouth 3 (three) times daily. )  . glucose blood (ACCU-CHEK AVIVA PLUS) test strip Use as directed for twice/day glucose monitoring  . hydrocortisone (ANUSOL-HC) 2.5 % rectal cream Place 1 application rectally 2 (two) times daily.  . insulin NPH-regular Human (NOVOLIN 70/30) (70-30) 100 UNIT/ML injection Inject 25 Units into the skin 2 (two) times daily with a meal. Except dialysis days. Do not take on Tuesdays, Thursdays, and Saturdays.  . Insulin Syringe-Needle U-100 (BD VEO INSULIN SYRINGE U/F) 31G X 15/64" 0.5 ML MISC USE AS DIRECTED TWICE DAILY  . Ipratropium-Albuterol (COMBIVENT) 20-100 MCG/ACT AERS respimat Inhale 1 puff into the lungs every 6 (six) hours.  . levETIRAcetam (KEPPRA) 1000 MG tablet Take 1 tablet (1,000 mg total) by mouth  daily.  [name] losartan (COZAAR) 100 MG tablet Take 100 mg by mouth daily. Takes in the morning  . metoprolol succinate (TOPROL-XL) 100 MG 24 hr tablet Take 100 mg by mouth daily. Take with or immediately following a meal.   . omeprazole (PRILOSEC) 20 MG capsule Take 20 mg by mouth 2 (two) times daily.   [name] torsemide (DEMADEX) 100 MG tablet Take 100 mg by mouth daily.    Allergies:   Hydrocodone, Tape, and Tapentadol  Review of Systems (ROS):  Review of systems NEGATIVE unless otherwise noted in narrative H&P section.   Vital Signs: Today's Vitals   10/18/19 1238 10/18/19 1242 10/18/19 1250 10/18/19 1404  BP:  (!) 187/83    Pulse:  82    Resp:  17    Temp:  99.2 F (37.3 C)    TempSrc:  Oral    SpO2:  (!) 85% 94%  Weight: 300 lb (136.1 kg)     Height: '5\' 9"'  (1.753 m)     PainSc:    0-No pain    Physical Exam: Physical Exam  Constitutional: She is well-developed, well-nourished, and in no distress.  Acutely ill appearing; fatigued/listless.  HENT:  Head: Normocephalic and atraumatic.  Eyes: Pupils are equal, round, and reactive to light.  Cardiovascular: Normal rate, regular rhythm, normal heart sounds and intact distal pulses.  Pulmonary/Chest: Effort normal and breath sounds normal.  Mild cough noted in clinic. Mild SOB. No increased WOB. No distress. Able to speak in complete sentences without difficulties. SPO2 85% on RA; increases to 94% on 2L/[place].   Abdominal: Soft. Normal appearance and bowel sounds are normal. She exhibits no distension. There is generalized abdominal tenderness.  Musculoskeletal:     Cervical back: Normal range of motion and neck supple.  Neurological: She is alert. She has normal sensation. She displays weakness (generalized). Gait (2/2 acute weakness) abnormal.  Confused. Speech difficulties per baseline following CVA. (+) expressive aphasia.   Skin: Skin is warm and dry. No rash noted. She is not diaphoretic.  Psychiatric: Mood, memory, affect and  judgment normal.  Nursing note and vitals reviewed.   Urgent Care Treatments / Results:   Orders Placed This Encounter  Procedures  . DG Chest 2 View  . Comprehensive metabolic panel  . Urinalysis, Complete w Microscopic  . CBC with Differential  . Troponin I (high sensitivity)  . Oxygen therapy Mode or (Route): Nasal cannula; Liters Per Minute: 2    . EKG 12-Lead    LABS: PLEASE NOTE: all labs that were ordered this encounter are listed, however only abnormal results are displayed. Labs Reviewed  COMPREHENSIVE METABOLIC PANEL - Abnormal; Notable for the following components:      Result Value   Sodium 134 (*)    Potassium 5.4 (*)    Chloride 92 (*)    Glucose, Bld 188 (*)    BUN 54 (*)    Creatinine, Ser 10.92 (*)    Total Protein 8.4 (*)    AST 14 (*)    GFR calc non Af Amer 3 (*)    GFR calc Af Amer 4 (*)    Anion gap 19 (*)    All other components within normal limits  CBC WITH DIFFERENTIAL/PLATELET - Abnormal; Notable for the following components:   WBC 11.4 (*)    RBC 3.60 (*)    Hemoglobin 9.9 (*)    HCT 32.2 (*)    RDW 16.3 (*)    Neutro Abs 8.8 (*)    All other components within normal limits  TROPONIN I (HIGH SENSITIVITY)    URGENT CARE ECG REPORT Date: 10/18/2019 Time ECG obtained: 1353 PM Rate: 78 bpm Rhythm: normal sinus rhythm Axis (leads I and aVF): normal  Intervals: PR normal at 144 ms. QTc prolonged at 483 ms. ST segment and T wave changes: No evidence of ST segment elevation or depression Comparison: Similar to previous tracing obtained on 07/30/2019; (+) evidence of QTc prolongation (previously 467 ms)    RADIOLOGY:  DG Chest 2 View  Result Date: 10/18/2019 CLINICAL DATA:  Cough, low-grade fever EXAM: CHEST - 2 VIEW COMPARISON:  July 30, 2019 FINDINGS: The mediastinal contour and cardiac silhouette are stable. Mild increased pulmonary interstitium is identified bilaterally. No focal pneumonia or pleural effusion is noted. The bony  structures are stable. IMPRESSION: Mild interstitial edema. No focal pneumonia. Electronically Signed   By: Abelardo Diesel  M.D.   On: 10/18/2019 13:23    PROCEDURES: Procedures  MEDICATIONS RECEIVED THIS VISIT: Medications - No data to display  PERTINENT CLINICAL COURSE NOTES/UPDATES:   Initial Impression / Assessment and Plan / Urgent Care Course:  Pertinent labs & imaging results that were available during my care of the patient were personally reviewed by me and considered in my medical decision making (see lab/imaging section of note for values and interpretations).  Cynthia Dean is a 69 y.o. female who presents to [facility] today with complaints of Cough and Emesis  Patient acutely ill appearing (non-toxic) appearing in clinic today. She does not appear to be in any acute distress. She does not appear to be in any acute distress. Presenting symptoms (see HPI) and exam as documented above. Patient is s/p SARS-CoV-2 (novel coronavirus) that was diagnosed on 07/30/2019. Symptoms worsening over the course of the last 3 days. Patient is on HD with last Tx being done yesterday. Little to no oral intake since onset of symptoms. Daughter concerned about increasing AMS. Will pursue workup as follows:   EKG shows NSR without ectopy at a rate of 78 bpm. QTc prolonged at 483 ms.    Radiographs of the chest performed today revealed no acute cardiopulmonary process; no evidence of peribronchial thickening, areas of consolidation, or focal infiltrates to suggest CAP. There is chronic stable interstitial edema noted.    WBC 11.4. Hgb 9.9, Hct 32.2, MCV 89.4, MCH 27.5, and platelets 268 K/uL.   Patient with known ESRD and resulting anemia of chronic disease.    Na 134, K+ 5.4, glucose 188, BUN 54, and creatinine 10.92 mg/dL. Estimated Creatinine Clearance: 7.3 mL/min (A) (by C-G formula based on SCr of 10.92 mg/dL (H)). AST 14, ALT 13, ALP 81, and total bilirubin 0.8 mg/dL.     hs-TnI normal at 15 ng/L   Attempted to collect urine sample on two separate occassions during time spent in UC today. Patient missed collection device (hat) both times. Due to ESRD, and undoubtedly because of her reduced oral intake, patient does not produce large volumes of urine. Anticipate transfer to ED; will defer.   Results review with patient and her daughter. Etiology of current symptoms unknown. Discussed DDx possibilities at this time, which include urinary tract infection, dehydration, and hepatic encephalopathy. Patient's presenting symptoms today are felt to require further evaluation and possible intervention in a setting that is capable of providing a higher level of care. Patient is going to need additional labs and possible CT imaging of the head. I discussed with patient that her problems today are outside of the capabilities of this outpatient urgent care setting, thus my recommendations are to have her seen in the emergency department at either [facility] or [facility]. After discussion, patient's daughter expresses that she would like for her mother to be seen at [facility]. Discussed EMS transport, however patient's daughter expresses wishes to personally take patient to the hospital.   Patient report called to [facility] emergency department staff; spoke with charge nurse Rosana Hoes, RN). Nurse was advised of patient's presenting complaints, assessment in clinic, findings from work up thus far, and plans for patient to present there for ongoing evaluation and management of her symptoms. Questions fielded. Nurse advised to return a call to [facility] with any questions or concerns pertaining to the care that Cynthia Dean received here today. Hospital staff aware that patient will be presenting to their facility via POV today.    Final diagnoses:  Altered mental status, unspecified altered mental status type  Weakness  Generalized abdominal pain  Nausea and vomiting,  intractability of vomiting not specified, unspecified vomiting type  Cough    New Prescriptions:  [place] Controlled Substance Registry consulted? Not Applicable  No orders of the defined types were placed in this encounter.   Recommended Follow up Care:   Follow-up Information    [facility] EMERGENCY DEPARTMENT. Go to .   Specialty: Emergency Medicine Why: Proceed to the ED from UC today for further evaluation and treatment. Contact information: [place] [phone] [address] [place] [phone]        NOTE: This note was prepared using Dragon dictation software along with smaller phrase technology. Despite my best ability to proofread, there is the potential that transcriptional errors may still occur from this process, and are completely unintentional.    Karen Kitchens, NP 10/19/19 [phone]

## 2019-10-18 NOTE — ED Triage Notes (Signed)
Pt sent from urgent care for AMS and vomiting.  Pt has had complete work up including EKG, chest xray and labs including troponin at urgent care visible in epic. Pt has noted expressive aphasia.  Have attempted to call family and find out baseline and what changes are present. Unsure if has fallen again, on plavix.  Pt does have residual speech deficit  from previous CVA.  Per NP note from UC pt has had increasing confusion but unsure of what has changes.  Two attempts to call daughter, but no answer.    Per dr Ellender Hose no protocols except CT

## 2019-10-18 NOTE — Discharge Instructions (Addendum)
Leave urgent care and proceed to the emergency room for further evaluation.   Honor Loh, MSN, APRN, FNP-C, CEN Advanced Practice Provider [facility] 10/18/2019 1:58 PM

## 2019-10-20 LAB — URINE CULTURE

## 2019-10-21 ENCOUNTER — Inpatient Hospital Stay
Admission: EM | Admit: 2019-10-21 | Discharge: 2019-10-25 | DRG: 638 | Disposition: A | Discharge status: Home Health | Payer: Medicare HMO | Attending: Family Medicine | Admitting: Family Medicine

## 2019-10-21 ENCOUNTER — Other Ambulatory Visit: Payer: Self-pay

## 2019-10-21 ENCOUNTER — Emergency Department: Payer: Medicare HMO

## 2019-10-21 DIAGNOSIS — Z91048 Other nonmedicinal substance allergy status: Secondary | ICD-10-CM

## 2019-10-21 DIAGNOSIS — R4189 Other symptoms and signs involving cognitive functions and awareness: Secondary | ICD-10-CM | POA: Diagnosis present

## 2019-10-21 DIAGNOSIS — R569 Unspecified convulsions: Secondary | ICD-10-CM

## 2019-10-21 DIAGNOSIS — E11649 Type 2 diabetes mellitus with hypoglycemia without coma: Principal | ICD-10-CM | POA: Diagnosis present

## 2019-10-21 DIAGNOSIS — Z888 Allergy status to other drugs, medicaments and biological substances status: Secondary | ICD-10-CM

## 2019-10-21 DIAGNOSIS — Z20822 Contact with and (suspected) exposure to covid-19: Secondary | ICD-10-CM | POA: Diagnosis present

## 2019-10-21 DIAGNOSIS — R680 Hypothermia, not associated with low environmental temperature: Secondary | ICD-10-CM | POA: Diagnosis present

## 2019-10-21 DIAGNOSIS — Z992 Dependence on renal dialysis: Secondary | ICD-10-CM

## 2019-10-21 DIAGNOSIS — R6520 Severe sepsis without septic shock: Secondary | ICD-10-CM

## 2019-10-21 DIAGNOSIS — T68XXXA Hypothermia, initial encounter: Secondary | ICD-10-CM | POA: Diagnosis not present

## 2019-10-21 DIAGNOSIS — Z7902 Long term (current) use of antithrombotics/antiplatelets: Secondary | ICD-10-CM

## 2019-10-21 DIAGNOSIS — G9340 Encephalopathy, unspecified: Secondary | ICD-10-CM | POA: Diagnosis present

## 2019-10-21 DIAGNOSIS — N186 End stage renal disease: Secondary | ICD-10-CM | POA: Diagnosis not present

## 2019-10-21 DIAGNOSIS — Z7982 Long term (current) use of aspirin: Secondary | ICD-10-CM

## 2019-10-21 DIAGNOSIS — E162 Hypoglycemia, unspecified: Secondary | ICD-10-CM | POA: Diagnosis not present

## 2019-10-21 DIAGNOSIS — Z803 Family history of malignant neoplasm of breast: Secondary | ICD-10-CM

## 2019-10-21 DIAGNOSIS — I452 Bifascicular block: Secondary | ICD-10-CM | POA: Diagnosis present

## 2019-10-21 DIAGNOSIS — Z79899 Other long term (current) drug therapy: Secondary | ICD-10-CM

## 2019-10-21 DIAGNOSIS — E11319 Type 2 diabetes mellitus with unspecified diabetic retinopathy without macular edema: Secondary | ICD-10-CM | POA: Diagnosis present

## 2019-10-21 DIAGNOSIS — E1151 Type 2 diabetes mellitus with diabetic peripheral angiopathy without gangrene: Secondary | ICD-10-CM | POA: Diagnosis present

## 2019-10-21 DIAGNOSIS — Z823 Family history of stroke: Secondary | ICD-10-CM

## 2019-10-21 DIAGNOSIS — Z794 Long term (current) use of insulin: Secondary | ICD-10-CM

## 2019-10-21 DIAGNOSIS — G40909 Epilepsy, unspecified, not intractable, without status epilepticus: Secondary | ICD-10-CM

## 2019-10-21 DIAGNOSIS — E785 Hyperlipidemia, unspecified: Secondary | ICD-10-CM | POA: Diagnosis present

## 2019-10-21 DIAGNOSIS — E875 Hyperkalemia: Secondary | ICD-10-CM | POA: Diagnosis present

## 2019-10-21 DIAGNOSIS — Z6841 Body Mass Index (BMI) 40.0 and over, adult: Secondary | ICD-10-CM

## 2019-10-21 DIAGNOSIS — I10 Essential (primary) hypertension: Secondary | ICD-10-CM | POA: Diagnosis present

## 2019-10-21 DIAGNOSIS — D631 Anemia in chronic kidney disease: Secondary | ICD-10-CM | POA: Diagnosis present

## 2019-10-21 DIAGNOSIS — I132 Hypertensive heart and chronic kidney disease with heart failure and with stage 5 chronic kidney disease, or end stage renal disease: Secondary | ICD-10-CM | POA: Diagnosis present

## 2019-10-21 DIAGNOSIS — Z885 Allergy status to narcotic agent status: Secondary | ICD-10-CM

## 2019-10-21 DIAGNOSIS — M5416 Radiculopathy, lumbar region: Secondary | ICD-10-CM | POA: Diagnosis present

## 2019-10-21 DIAGNOSIS — I2510 Atherosclerotic heart disease of native coronary artery without angina pectoris: Secondary | ICD-10-CM | POA: Diagnosis present

## 2019-10-21 DIAGNOSIS — A419 Sepsis, unspecified organism: Secondary | ICD-10-CM

## 2019-10-21 DIAGNOSIS — J449 Chronic obstructive pulmonary disease, unspecified: Secondary | ICD-10-CM | POA: Diagnosis not present

## 2019-10-21 DIAGNOSIS — E1140 Type 2 diabetes mellitus with diabetic neuropathy, unspecified: Secondary | ICD-10-CM | POA: Diagnosis present

## 2019-10-21 DIAGNOSIS — G4733 Obstructive sleep apnea (adult) (pediatric): Secondary | ICD-10-CM | POA: Diagnosis present

## 2019-10-21 DIAGNOSIS — K219 Gastro-esophageal reflux disease without esophagitis: Secondary | ICD-10-CM | POA: Diagnosis present

## 2019-10-21 DIAGNOSIS — Z8616 Personal history of COVID-19: Secondary | ICD-10-CM

## 2019-10-21 DIAGNOSIS — N390 Urinary tract infection, site not specified: Secondary | ICD-10-CM | POA: Diagnosis not present

## 2019-10-21 DIAGNOSIS — Z87891 Personal history of nicotine dependence: Secondary | ICD-10-CM

## 2019-10-21 DIAGNOSIS — Z8249 Family history of ischemic heart disease and other diseases of the circulatory system: Secondary | ICD-10-CM

## 2019-10-21 DIAGNOSIS — I639 Cerebral infarction, unspecified: Secondary | ICD-10-CM | POA: Diagnosis present

## 2019-10-21 DIAGNOSIS — I5032 Chronic diastolic (congestive) heart failure: Secondary | ICD-10-CM | POA: Diagnosis present

## 2019-10-21 DIAGNOSIS — N2581 Secondary hyperparathyroidism of renal origin: Secondary | ICD-10-CM | POA: Diagnosis present

## 2019-10-21 DIAGNOSIS — E1122 Type 2 diabetes mellitus with diabetic chronic kidney disease: Secondary | ICD-10-CM | POA: Diagnosis present

## 2019-10-21 DIAGNOSIS — Z8673 Personal history of transient ischemic attack (TIA), and cerebral infarction without residual deficits: Secondary | ICD-10-CM

## 2019-10-21 LAB — CBC WITH DIFFERENTIAL/PLATELET
Abs Immature Granulocytes: 0.04 10*3/uL (ref 0.00–0.07)
Basophils Absolute: 0 10*3/uL (ref 0.0–0.1)
Basophils Relative: 0 %
Eosinophils Absolute: 0.1 10*3/uL (ref 0.0–0.5)
Eosinophils Relative: 1 %
HCT: 33.7 % — ABNORMAL LOW (ref 36.0–46.0)
Hemoglobin: 9.8 g/dL — ABNORMAL LOW (ref 12.0–15.0)
Immature Granulocytes: 1 %
Lymphocytes Relative: 17 %
Lymphs Abs: 1.4 10*3/uL (ref 0.7–4.0)
MCH: 27.5 pg (ref 26.0–34.0)
MCHC: 29.1 g/dL — ABNORMAL LOW (ref 30.0–36.0)
MCV: 94.7 fL (ref 80.0–100.0)
Monocytes Absolute: 0.8 10*3/uL (ref 0.1–1.0)
Monocytes Relative: 10 %
Neutro Abs: 5.6 10*3/uL (ref 1.7–7.7)
Neutrophils Relative %: 71 %
Platelets: 197 10*3/uL (ref 150–400)
RBC: 3.56 MIL/uL — ABNORMAL LOW (ref 3.87–5.11)
RDW: 16 % — ABNORMAL HIGH (ref 11.5–15.5)
WBC: 8 10*3/uL (ref 4.0–10.5)
nRBC: 0 % (ref 0.0–0.2)

## 2019-10-21 LAB — COMPREHENSIVE METABOLIC PANEL
ALT: 10 U/L (ref 0–44)
AST: 11 U/L — ABNORMAL LOW (ref 15–41)
Albumin: 4 g/dL (ref 3.5–5.0)
Alkaline Phosphatase: 74 U/L (ref 38–126)
Anion gap: 19 — ABNORMAL HIGH (ref 5–15)
BUN: 82 mg/dL — ABNORMAL HIGH (ref 8–23)
CO2: 23 mmol/L (ref 22–32)
Calcium: 9.9 mg/dL (ref 8.9–10.3)
Chloride: 95 mmol/L — ABNORMAL LOW (ref 98–111)
Creatinine, Ser: 14.76 mg/dL — ABNORMAL HIGH (ref 0.44–1.00)
GFR calc Af Amer: 3 mL/min — ABNORMAL LOW (ref >60)
GFR calc non Af Amer: 2 mL/min — ABNORMAL LOW (ref >60)
Glucose, Bld: 148 mg/dL — ABNORMAL HIGH (ref 70–99)
Potassium: 4.7 mmol/L (ref 3.5–5.1)
Sodium: 137 mmol/L (ref 135–145)
Total Bilirubin: 0.6 mg/dL (ref 0.3–1.2)
Total Protein: 8.2 g/dL — ABNORMAL HIGH (ref 6.5–8.1)

## 2019-10-21 LAB — URINALYSIS, COMPLETE (UACMP) WITH MICROSCOPIC
Bilirubin Urine: NEGATIVE
Glucose, UA: NEGATIVE mg/dL
Hgb urine dipstick: NEGATIVE
Ketones, ur: NEGATIVE mg/dL
Leukocytes,Ua: NEGATIVE
Nitrite: NEGATIVE
Protein, ur: 100 mg/dL — AB
Specific Gravity, Urine: 1.012 (ref 1.005–1.030)
pH: 7 (ref 5.0–8.0)

## 2019-10-21 LAB — GLUCOSE, CAPILLARY
Glucose-Capillary: 142 mg/dL — ABNORMAL HIGH (ref 70–99)
Glucose-Capillary: 101 mg/dL — ABNORMAL HIGH (ref 70–99)
Glucose-Capillary: 72 mg/dL (ref 70–99)
Glucose-Capillary: 113 mg/dL — ABNORMAL HIGH (ref 70–99)
Glucose-Capillary: 131 mg/dL — ABNORMAL HIGH (ref 70–99)
Glucose-Capillary: 139 mg/dL — ABNORMAL HIGH (ref 70–99)
Glucose-Capillary: 170 mg/dL — ABNORMAL HIGH (ref 70–99)
Glucose-Capillary: 141 mg/dL — ABNORMAL HIGH (ref 70–99)
Glucose-Capillary: 129 mg/dL — ABNORMAL HIGH (ref 70–99)
Glucose-Capillary: 120 mg/dL — ABNORMAL HIGH (ref 70–99)
Glucose-Capillary: 102 mg/dL — ABNORMAL HIGH (ref 70–99)
Glucose-Capillary: 94 mg/dL (ref 70–99)
Glucose-Capillary: 105 mg/dL — ABNORMAL HIGH (ref 70–99)
Glucose-Capillary: 116 mg/dL — ABNORMAL HIGH (ref 70–99)

## 2019-10-21 LAB — RESPIRATORY PANEL BY RT PCR (FLU A&B, COVID)
Influenza A by PCR: NEGATIVE
Influenza B by PCR: NEGATIVE
SARS Coronavirus 2 by RT PCR: NEGATIVE

## 2019-10-21 LAB — LACTIC ACID, PLASMA: Lactic Acid, Venous: 1.1 mmol/L (ref 0.5–1.9)

## 2019-10-21 LAB — PROCALCITONIN: Procalcitonin: 1.66 ng/mL

## 2019-10-21 LAB — TROPONIN I (HIGH SENSITIVITY): Troponin I (High Sensitivity): 14 ng/L (ref <18)

## 2019-10-21 MED ORDER — CLOPIDOGREL BISULFATE 75 MG PO TABS
75.0000 mg | ORAL_TABLET | Freq: Every day | ORAL | Status: DC
  Administered 2019-10-21 – 2019-10-24 (×3): 75 mg via ORAL
  Filled 2019-10-21 (×3): qty 1

## 2019-10-21 MED ORDER — ORAL CARE MOUTH RINSE
15.0000 mL | Freq: Two times a day (BID) | OROMUCOSAL | Status: DC
  Administered 2019-10-21 – 2019-10-24 (×5): 15 mL via OROMUCOSAL

## 2019-10-21 MED ORDER — METOPROLOL SUCCINATE ER 100 MG PO TB24
100.0000 mg | ORAL_TABLET | Freq: Every day | ORAL | Status: DC
  Administered 2019-10-21 – 2019-10-22 (×2): 100 mg via ORAL
  Filled 2019-10-21: qty 1
  Filled 2019-10-21: qty 2

## 2019-10-21 MED ORDER — CALCIUM ACETATE (PHOS BINDER) 667 MG PO CAPS
2001.0000 mg | ORAL_CAPSULE | Freq: Three times a day (TID) | ORAL | Status: DC
  Administered 2019-10-21 – 2019-10-24 (×9): 2001 mg via ORAL
  Filled 2019-10-21 (×12): qty 3

## 2019-10-21 MED ORDER — LORAZEPAM 2 MG/ML IJ SOLN: 1.0000 mg | INTRAMUSCULAR | Status: DC | PRN

## 2019-10-21 MED ORDER — LIDOCAINE-PRILOCAINE 2.5-2.5 % EX CREA: 1.0000 "application " | TOPICAL_CREAM | CUTANEOUS | Status: DC | PRN

## 2019-10-21 MED ORDER — LOSARTAN POTASSIUM 50 MG PO TABS
100.0000 mg | ORAL_TABLET | Freq: Every day | ORAL | Status: DC
  Administered 2019-10-21 – 2019-10-22 (×2): 100 mg via ORAL
  Filled 2019-10-21 (×2): qty 2

## 2019-10-21 MED ORDER — VITAMIN D3 25 MCG (1000 UNIT) PO TABS
1000.0000 [IU] | ORAL_TABLET | Freq: Every day | ORAL | Status: DC
  Administered 2019-10-21 – 2019-10-24 (×3): 1000 [IU] via ORAL
  Filled 2019-10-21 (×8): qty 1

## 2019-10-21 MED ORDER — FLUTICASONE PROPIONATE 50 MCG/ACT NA SUSP
1.0000 | Freq: Every day | NASAL | Status: DC
  Administered 2019-10-21 – 2019-10-24 (×2): 1 via NASAL
  Filled 2019-10-21 (×2): qty 16

## 2019-10-21 MED ORDER — IPRATROPIUM-ALBUTEROL 20-100 MCG/ACT IN AERS
1.0000 | INHALATION_SPRAY | Freq: Four times a day (QID) | RESPIRATORY_TRACT | Status: DC
  Administered 2019-10-21 – 2019-10-24 (×10): 1 via RESPIRATORY_TRACT
  Filled 2019-10-21: qty 4

## 2019-10-21 MED ORDER — DOCUSATE SODIUM 100 MG PO CAPS
100.0000 mg | ORAL_CAPSULE | Freq: Two times a day (BID) | ORAL | Status: DC
  Administered 2019-10-21 – 2019-10-24 (×6): 100 mg via ORAL
  Filled 2019-10-21 (×6): qty 1

## 2019-10-21 MED ORDER — DEXTROSE 50 % IV SOLN
25.0000 mL | INTRAVENOUS | Status: DC | PRN
  Filled 2019-10-21: qty 50

## 2019-10-21 MED ORDER — LEVETIRACETAM 500 MG PO TABS
1000.0000 mg | ORAL_TABLET | Freq: Every day | ORAL | Status: DC
  Administered 2019-10-21 – 2019-10-24 (×4): 1000 mg via ORAL
  Filled 2019-10-21 (×4): qty 2

## 2019-10-21 MED ORDER — ASPIRIN EC 325 MG PO TBEC
325.0000 mg | DELAYED_RELEASE_TABLET | Freq: Every day | ORAL | Status: DC
  Administered 2019-10-21 – 2019-10-24 (×3): 325 mg via ORAL
  Filled 2019-10-21 (×3): qty 1

## 2019-10-21 MED ORDER — HYDRALAZINE HCL 20 MG/ML IJ SOLN
5.0000 mg | INTRAMUSCULAR | Status: DC | PRN
  Administered 2019-10-22 – 2019-10-23 (×2): 5 mg via INTRAVENOUS
  Filled 2019-10-21 (×2): qty 1

## 2019-10-21 MED ORDER — DEXTROSE 10 % IV SOLN: INTRAVENOUS | Status: DC

## 2019-10-21 MED ORDER — VANCOMYCIN HCL IN DEXTROSE 1-5 GM/200ML-% IV SOLN
1000.0000 mg | Freq: Once | INTRAVENOUS | Status: AC
  Administered 2019-10-21: 1000 mg via INTRAVENOUS
  Filled 2019-10-21: qty 200

## 2019-10-21 MED ORDER — OXYCODONE-ACETAMINOPHEN 5-325 MG PO TABS
1.0000 | ORAL_TABLET | ORAL | Status: DC | PRN
  Administered 2019-10-22: 1 via ORAL
  Filled 2019-10-21: qty 1

## 2019-10-21 MED ORDER — HYDROCORTISONE (PERIANAL) 2.5 % EX CREA
1.0000 "application " | TOPICAL_CREAM | Freq: Two times a day (BID) | CUTANEOUS | Status: DC | PRN
  Filled 2019-10-21: qty 28.35

## 2019-10-21 MED ORDER — TORSEMIDE 100 MG PO TABS
100.0000 mg | ORAL_TABLET | Freq: Every day | ORAL | Status: DC
  Administered 2019-10-21 – 2019-10-24 (×3): 100 mg via ORAL
  Filled 2019-10-21 (×5): qty 1

## 2019-10-21 MED ORDER — SODIUM CHLORIDE 0.9 % IV SOLN
1.0000 g | Freq: Once | INTRAVENOUS | Status: AC
  Administered 2019-10-21: 1 g via INTRAVENOUS
  Filled 2019-10-21: qty 1

## 2019-10-21 MED ORDER — DEXTROSE 10 % IV SOLN: INTRAVENOUS | Status: AC

## 2019-10-21 MED ORDER — DM-GUAIFENESIN ER 30-600 MG PO TB12
1.0000 | ORAL_TABLET | Freq: Two times a day (BID) | ORAL | Status: DC
  Administered 2019-10-21 – 2019-10-24 (×7): 1 via ORAL
  Filled 2019-10-21 (×8): qty 1

## 2019-10-21 MED ORDER — ATORVASTATIN CALCIUM 80 MG PO TABS
80.0000 mg | ORAL_TABLET | Freq: Every evening | ORAL | Status: DC
  Administered 2019-10-21 – 2019-10-24 (×4): 80 mg via ORAL
  Filled 2019-10-21 (×4): qty 1
  Filled 2019-10-21 (×4): qty 4
  Filled 2019-10-21: qty 1

## 2019-10-21 MED ORDER — ALBUTEROL SULFATE HFA 108 (90 BASE) MCG/ACT IN AERS
2.0000 | INHALATION_SPRAY | RESPIRATORY_TRACT | Status: DC | PRN
  Filled 2019-10-21: qty 6.7

## 2019-10-21 MED ORDER — GABAPENTIN 100 MG PO CAPS
100.0000 mg | ORAL_CAPSULE | Freq: Every day | ORAL | Status: DC
  Administered 2019-10-21 – 2019-10-24 (×4): 100 mg via ORAL
  Filled 2019-10-21 (×4): qty 1

## 2019-10-21 MED ORDER — ACETAMINOPHEN 325 MG PO TABS: 650.0000 mg | ORAL_TABLET | Freq: Four times a day (QID) | ORAL | Status: DC | PRN

## 2019-10-21 MED ORDER — PANTOPRAZOLE SODIUM 40 MG PO TBEC
40.0000 mg | DELAYED_RELEASE_TABLET | Freq: Every day | ORAL | Status: DC
  Administered 2019-10-21 – 2019-10-24 (×3): 40 mg via ORAL
  Filled 2019-10-21 (×3): qty 1

## 2019-10-21 NOTE — ED Notes (Addendum)
Pt placed on 4L [place] d/t RA sats being at 88%. Pt now satting at 93%.

## 2019-10-21 NOTE — ED Provider Notes (Addendum)
[facility] Emergency Department Provider Note  ____________________________________________  Time seen: Approximately 6:14 AM  I have reviewed the triage vital signs and the nursing notes.   HISTORY  Chief Complaint Hypoglycemia   HPI Cynthia Dean is a 69 y.o. female with a history of ESRD on HD, COPD on chronic 4 L nasal cannula, CAD, stroke, diabetes, hypertension, hyperlipidemia who presents from home for hypoglycemia.  Per husband, patient did not get up to go to the bathroom this evening which prompted him to check on her.  He found her unresponsive.  According to the husband patient had taken her evening insulin and had not eaten and went to bed because she was not feeling well. Police arrived at the scene and patient with agonal respirations and they started CPR. When EMS arrived patient had agonal respirations but did have a pulse.  They check her blood glucose which was 21.  She received 250 cc of D10 and recheck blood glucose is 187.  Patient woke up after receiving sugar.  Patient missed her last 2 dialysis with the last treatment being 7 days ago.  Patient reports that she has not felt well since receiving her second Covid shot.  She has been feeling weak and tired. Has had a hacking cough for a few weeks. Was positive for COVID in January 2021, and she received her second Covid vaccine 4 days ago. No shortness of breath, no chest pain, no abdominal pain, no vomiting or diarrhea, no dysuria.  She does report nausea and decreased appetite.  Past Medical History:  Diagnosis Date  . Acute on chronic respiratory failure with hypoxia ([place]) 07/30/2019  . Acute respiratory failure with hypoxia ([place]) 12/31/2018  . Carotid arterial disease ([place])    a. 02/2018 < bilat ICA stenosis.  [name] COPD (chronic obstructive pulmonary disease) ([place])   . Coronary artery disease    a. 11/2015 MV: EF 57%, no ischemia/infarct.  . Cryptogenic stroke ([place])    a. 10/2017 s/p  implantable loop recorder (no Afib to date).  . Diabetes mellitus without complication ([place])   . Diarrhea   . ESRD on dialysis ([place])   . GI bleed    a. 01/2019 EGD/Colonoscopy: Mild gastritis. Colitis.  [name] Heart murmur    a. 12/2018 Echo: EF 55-60%, impaired relaxation. Mod dil LA. Mildly dil RA. Mild Ca2+ of AoV.  [name] Hematemesis 06/27/2017  . History of 2019 novel coronavirus disease (COVID-19) 07/30/2019  . Hyperkalemia   . Hyperlipidemia   . Hypertension   . Intractable nausea and vomiting 08/08/2015  . Nausea & vomiting 05/30/2018  . Nausea and vomiting 05/29/2018  . Pneumonia due to COVID-19 virus     Patient Active Problem List   Diagnosis Date Noted  . Hypoglycemia 10/21/2019  . Unresponsiveness 10/21/2019  . GERD (gastroesophageal reflux disease) 10/21/2019  . Acute metabolic encephalopathy   . Acute on chronic diastolic CHF (congestive heart failure) ([place])   . Rectal bleed 02/13/2019  . Aphasia as late effect of cerebrovascular accident (CVA) 05/01/2018  . Seizure ([place]) 03/14/2018  . Stroke ([place]) 03/07/2018  . Slurred speech 11/14/2017  . 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[place]) 11/12/2017  . CAD (coronary artery disease) 11/12/2017  . COPD (chronic obstructive pulmonary disease) ([place]) 11/12/2017  . ESRD on dialysis ([place]) 11/12/2017  . CVA (cerebral vascular accident) ([place]) 04/14/2016  . Essential hypertension 11/27/2015  . Hyperlipidemia 11/27/2015  . Prolonged Q-T interval on ECG 11/26/2015  .  Aphasia complicating stroke 15/83/0940  . Cervical spine arthritis 07/27/2015  . Diabetic retinopathy without macular edema associated with type 2 diabetes mellitus ([place]) 07/27/2015  . Osteoarthritis of spine with radiculopathy, lumbar region 07/27/2015  . Obesity, Class III, BMI 40-49.9 (morbid obesity) ([place]) 07/27/2015  . Vitamin D deficiency 01/21/2011  . Diastolic heart failure ([place]) 10/27/2010    Past Surgical History:  Procedure Laterality Date   . A/V FISTULAGRAM Left 12/20/2016   Procedure: A/V Fistulagram;  Surgeon: Katha Cabal, MD;  Location: [facility];  Service: Cardiovascular;  Laterality: Left;  . A/V SHUNT INTERVENTION N/A 12/20/2016   Procedure: A/V Shunt Intervention;  Surgeon: Katha Cabal, MD;  Location: [facility];  Service: Cardiovascular;  Laterality: N/A;  . A/V SHUNTOGRAM Left 09/11/2017   Procedure: A/V SHUNTOGRAM;  Surgeon: Algernon Huxley, MD;  Location: [facility];  Service: Cardiovascular;  Laterality: Left;  . BREAST BIOPSY Bilateral 07/19/00   neg  . COLONOSCOPY WITH PROPOFOL N/A 02/16/2019   Procedure: COLONOSCOPY WITH PROPOFOL;  Surgeon: Toledo, Benay Pike, MD;  Location: [facility];  Service: Gastroenterology;  Laterality: N/A;  . ESOPHAGOGASTRODUODENOSCOPY (EGD) WITH PROPOFOL N/A 02/16/2019   Procedure: ESOPHAGOGASTRODUODENOSCOPY (EGD) WITH PROPOFOL;  Surgeon: Toledo, Benay Pike, MD;  Location: [facility];  Service: Gastroenterology;  Laterality: N/A;  . LOOP RECORDER INSERTION N/A 11/16/2017   Procedure: LOOP RECORDER INSERTION;  Surgeon: Deboraha Sprang, MD;  Location: [facility];  Service: Cardiovascular;  Laterality: N/A;  . PERIPHERAL VASCULAR CATHETERIZATION Left 02/02/2015   Procedure: A/V Shuntogram/Fistulagram;  Surgeon: Algernon Huxley, MD;  Location: [facility];  Service: Cardiovascular;  Laterality: Left;  . PERIPHERAL VASCULAR CATHETERIZATION Left 02/02/2015   Procedure: A/V Shunt Intervention;  Surgeon: Algernon Huxley, MD;  Location: [facility];  Service: Cardiovascular;  Laterality: Left;  . PERIPHERAL VASCULAR CATHETERIZATION Left 03/09/2015   Procedure: A/V Shuntogram/Fistulagram;  Surgeon: Algernon Huxley, MD;  Location: [facility];  Service: Cardiovascular;  Laterality: Left;  . PERIPHERAL VASCULAR CATHETERIZATION N/A 03/09/2015   Procedure: A/V Shunt Intervention;  Surgeon: Algernon Huxley, MD;  Location: [facility];   Service: Cardiovascular;  Laterality: N/A;  . PERIPHERAL VASCULAR CATHETERIZATION Left 04/17/2015   Procedure: Upper Extremity Angiography;  Surgeon: Algernon Huxley, MD;  Location: [facility];  Service: Cardiovascular;  Laterality: Left;  . PERIPHERAL VASCULAR CATHETERIZATION  04/17/2015   Procedure: Upper Extremity Intervention;  Surgeon: Algernon Huxley, MD;  Location: [facility];  Service: Cardiovascular;;  . PERIPHERAL VASCULAR CATHETERIZATION N/A 08/10/2015   Procedure: A/V Shuntogram/Fistulagram;  Surgeon: Algernon Huxley, MD;  Location: [facility];  Service: Cardiovascular;  Laterality: N/A;  . PERIPHERAL VASCULAR CATHETERIZATION N/A 08/10/2015   Procedure: A/V Shunt Intervention;  Surgeon: Algernon Huxley, MD;  Location: [facility];  Service: Cardiovascular;  Laterality: N/A;  . PERIPHERAL VASCULAR CATHETERIZATION Left 04/11/2016   Procedure: A/V Shuntogram/Fistulagram;  Surgeon: Algernon Huxley, MD;  Location: [facility];  Service: Cardiovascular;  Laterality: Left;  . TEE WITHOUT CARDIOVERSION N/A 11/16/2017   Procedure: TRANSESOPHAGEAL ECHOCARDIOGRAM (TEE);  Surgeon: Minna Merritts, MD;  Location: [facility];  Service: Cardiovascular;  Laterality: N/A;    Prior to Admission medications   Medication Sig Start Date End Date Taking? Authorizing Provider  ACCU-CHEK AVIVA PLUS test strip  08/19/19   [provider]  Accu-Chek Softclix Lancets lancets  08/19/19  [provider]  aspirin EC 325 MG tablet Take 325 mg by mouth daily. 05/28/19   [provider]  atorvastatin (LIPITOR) 80 MG tablet Take 80 mg by mouth every evening.     [provider]  Blood Glucose Monitoring Suppl (ACCU-CHEK AVIVA PLUS) w/Device KIT  08/19/19   [provider]  Blood Glucose Monitoring Suppl (FIFTY50 GLUCOSE METER 2.0) w/Device KIT Accu-Check Aviva. Use as instructed for bid glucose monitoring 08/18/19 08/17/20  [provider]    calcium acetate (PHOSLO) 667 MG capsule Take 3 capsules (2,001 mg total) by mouth 3 (three) times daily with meals. 08/11/19   Ezekiel Slocumb, DO  cephALEXin (KEFLEX) 500 MG capsule Take 1 capsule (500 mg total) by mouth 2 (two) times daily. 10/18/19   Harvest Dark, MD  cholecalciferol (VITAMIN D) 25 MCG tablet Take 1 tablet (1,000 Units total) by mouth daily. 08/11/19   Ezekiel Slocumb, DO  clopidogrel (PLAVIX) 75 MG tablet TAKE ONE TABLET BY MOUTH ONCE DAILY Patient taking differently: Take 75 mg by mouth daily.  06/14/16   Stegmayer, Joelene Millin A, PA-C  docusate sodium (COLACE) 100 MG capsule Take 1 capsule (100 mg total) by mouth 2 (two) times daily. 10/18/19 10/17/20  Harvest Dark, MD  fluticasone (FLONASE) 50 MCG/ACT nasal spray 2 sprays by Each Nare route daily. Prior to CPAP 08/07/18   [provider]  gabapentin (NEURONTIN) 100 MG capsule Take 2 capsules (200 mg total) by mouth 3 (three) times daily. Patient taking differently: Take 100 mg by mouth 3 (three) times daily.  03/28/19 03/27/20  Rudene Re, MD  glucose blood (ACCU-CHEK AVIVA PLUS) test strip Use as directed for twice/day glucose monitoring 08/18/19   [provider]  hydrocortisone (ANUSOL-HC) 2.5 % rectal cream Place 1 application rectally 2 (two) times daily. 09/09/19   Lin Landsman, MD  insulin NPH-regular Human (NOVOLIN 70/30) (70-30) 100 UNIT/ML injection Inject 25 Units into the skin 2 (two) times daily with a meal. Except dialysis days. Do not take on Tuesdays, Thursdays, and Saturdays. 11/17/17   Gouru, Illene Silver, MD  Insulin Syringe-Needle U-100 (BD VEO INSULIN SYRINGE U/F) 31G X 15/64" 0.5 ML MISC USE AS DIRECTED TWICE DAILY 08/18/19   [provider]  Ipratropium-Albuterol (COMBIVENT) 20-100 MCG/ACT AERS respimat Inhale 1 puff into the lungs every 6 (six) hours. 08/11/19   Ezekiel Slocumb, DO  levETIRAcetam (KEPPRA) 1000 MG tablet Take 1 tablet (1,000 mg total) by mouth daily.  03/15/18   Vaughan Basta, MD  lidocaine-prilocaine (EMLA) cream Apply 1 application topically as needed (for port access).     [provider]  losartan (COZAAR) 100 MG tablet Take 100 mg by mouth daily. Takes in the morning 09/12/17   [provider]  metoprolol succinate (TOPROL-XL) 100 MG 24 hr tablet Take 100 mg by mouth daily. Take with or immediately following a meal.     [provider]  omeprazole (PRILOSEC) 20 MG capsule Take 20 mg by mouth 2 (two) times daily.     [provider]  oxyCODONE-acetaminophen (PERCOCET) 5-325 MG tablet Take 1 tablet by mouth every 4 (four) hours as needed for severe pain. 10/18/19   Harvest Dark, MD  torsemide (DEMADEX) 100 MG tablet Take 100 mg by mouth daily. 10/10/17   [provider]    Allergies Hydrocodone, Tape, and Tapentadol  Family History  Problem Relation Age of Onset  . Breast cancer Father 43  . Stroke Mother   . Heart attack  Mother   . Heart Problems Sister   . Breast cancer Cousin 90       1 st cousin. Maternal     Social History Social History   Tobacco Use  . Smoking status: Former Smoker    Packs/day: 0.00    Types: Cigarettes  . Smokeless tobacco: Never Used  Substance Use Topics  . Alcohol use: No  . Drug use: No    Review of Systems  Constitutional: Negative for fever. + generalized weakness Eyes: Negative for visual changes. ENT: Negative for sore throat. Neck: No neck pain  Cardiovascular: Negative for chest pain. Respiratory: Negative for shortness of breath. + cough Gastrointestinal: Negative for abdominal pain, vomiting or diarrhea. Genitourinary: Negative for dysuria. Musculoskeletal: Negative for back pain. Skin: Negative for rash. Neurological: Negative for headaches, weakness or numbness. Psych: No SI or HI  ____________________________________________   PHYSICAL EXAM:  VITAL SIGNS: ED Triage Vitals  Enc Vitals Group     BP 10/21/19  0244 (!) 149/95     Pulse Rate 10/21/19 0244 (!) 52     Resp 10/21/19 0244 20     Temp 10/21/19 0449 (!) 93.2 F (34 C)     Temp Source 10/21/19 0449 Rectal     SpO2 10/21/19 0244 93 %     Weight 10/21/19 0244 300 lb (136.1 kg)     Height 10/21/19 0244 '5\' 9"'  (1.753 m)     Head Circumference --      Peak Flow --      Pain Score 10/21/19 0244 0     Pain Loc --      Pain Edu? --      Excl. in [place]? --     Constitutional: Alert and oriented, looks unwell.  HEENT:      Head: Normocephalic and atraumatic.         Eyes: Conjunctivae are normal. Sclera is non-icteric.       Mouth/Throat: Mucous membranes are moist.       Neck: Supple with no signs of meningismus. Cardiovascular: Regular rate and rhythm. No murmurs, gallops, or rubs. Cool to the touch Respiratory: Normal respiratory effort. Lungs are clear to auscultation bilaterally, faint wheezes bilaterally. Gastrointestinal: Soft, non tender, and non distended with positive bowel sounds. No rebound or guarding. Musculoskeletal: No edema, cyanosis, or erythema of extremities. Neurologic: Normal speech and language. Face is symmetric. Moving all extremities. No gross focal neurologic deficits are appreciated. Skin: Skin is warm, dry and intact. No rash noted. Psychiatric: Mood and affect are normal. Speech and behavior are normal.  ____________________________________________   LABS (all labs ordered are listed, but only abnormal results are displayed)  Labs Reviewed  GLUCOSE, CAPILLARY - Abnormal; Notable for the following components:      Result Value   Glucose-Capillary 142 (*)    All other components within normal limits  CBC WITH DIFFERENTIAL/PLATELET - Abnormal; Notable for the following components:   RBC 3.56 (*)    Hemoglobin 9.8 (*)    HCT 33.7 (*)    MCHC 29.1 (*)    RDW 16.0 (*)    All other components within normal limits  COMPREHENSIVE METABOLIC PANEL - Abnormal; Notable for the following components:   Chloride 95  (*)    Glucose, Bld 148 (*)    BUN 82 (*)    Creatinine, Ser 14.76 (*)    Total Protein 8.2 (*)    AST 11 (*)    GFR calc non Af Amer 2 (*)  GFR calc Af Amer 3 (*)    Anion gap 19 (*)    All other components within normal limits  GLUCOSE, CAPILLARY - Abnormal; Notable for the following components:   Glucose-Capillary 101 (*)    All other components within normal limits  URINALYSIS, COMPLETE (UACMP) WITH MICROSCOPIC - Abnormal; Notable for the following components:   Color, Urine YELLOW (*)    APPearance HAZY (*)    Protein, ur 100 (*)    Bacteria, UA RARE (*)    All other components within normal limits  RESPIRATORY PANEL BY RT PCR (FLU A&B, COVID)  CULTURE, BLOOD (ROUTINE X 2)  CULTURE, BLOOD (ROUTINE X 2)  LACTIC ACID, PLASMA  PROCALCITONIN  GLUCOSE, CAPILLARY  CBG MONITORING, ED  CBG MONITORING, ED  CBG MONITORING, ED  TROPONIN I (HIGH SENSITIVITY)   ____________________________________________  EKG  ED ECG REPORT I, Rudene Re, the attending physician, personally viewed and interpreted this ECG.  Sinus bradycardia, rate of 54, right bundle branch block, LPFB, prolonged QTC, T wave inversions in anterior and inferior leads with no ST elevation. New when compared to prior ____________________________________________  RADIOLOGY  I have personally reviewed the images performed during this visit and I agree with the Radiologist's read.   Interpretation by Radiologist:  DG Chest Portable 1 View  Result Date: 10/21/2019 CLINICAL DATA:  Cough EXAM: PORTABLE CHEST 1 VIEW COMPARISON:  Three days ago FINDINGS: Generalized interstitial coarsening. Normal heart size. An implantable loop recorder is present. Aortic atherosclerosis. There is no edema, consolidation, effusion, or pneumothorax. IMPRESSION: Interstitial coarsening that could be congestive or bronchitic. Electronically Signed   By: Monte Fantasia M.D.   On: 10/21/2019 06:41        ____________________________________________   PROCEDURES  Procedure(s) performed: yes .1-3 Lead EKG Interpretation Performed by: Rudene Re, MD Authorized by: Rudene Re, MD     Interpretation: abnormal     ECG rate assessment: normal     Rhythm: sinus rhythm     Rhythm comment:  RBBB   Ectopy: none     Conduction: normal     Critical Care performed: yes  CRITICAL CARE Performed by: Rudene Re  ?  Total critical care time: 30 min  Critical care time was exclusive of separately billable procedures and treating other patients.  Critical care was necessary to treat or prevent imminent or life-threatening deterioration.  Critical care was time spent personally by me on the following activities: development of treatment plan with patient and/or surrogate as well as nursing, discussions with consultants, evaluation of patient's response to treatment, examination of patient, obtaining history from patient or surrogate, ordering and performing treatments and interventions, ordering and review of laboratory studies, ordering and review of radiographic studies, pulse oximetry and re-evaluation of patient's condition.  ____________________________________________   INITIAL IMPRESSION / ASSESSMENT AND PLAN / ED COURSE  69 y.o. female with a history of ESRD on HD, COPD on chronic 4 L nasal cannula, CAD, stroke, diabetes, hypertension, hyperlipidemia who presents from home for hypoglycemia after taking her insulin and not eating this evening.  Patient looks unwell but in no obvious distress, GCS of 15.  Initial vitals showing hypothermia with a rectal temperature of 93.68F, no tachycardia, normal blood pressure, no hypoxia on patient's baseline 4 L. BG 142 on arrival. EKG with no evidence of hyperkalemia however does show new right bundle branch block.  Labs with no leukocytosis, no lactic acidosis, stable anemia, potassium of 4.7 with an anion gap of 19, normal  bicarb, UA negative for UTI. Patient was monitor on telemetry with no dysrhythmias.  Blood sugar was monitored for 3 hours and slowly trending down after PO crackers and PB. CBG now 72. Patient reports feeling markedly improved but I will start her on 75 cc an hour D10 for 3 hours.  There is no indication for emergent dialysis.  After sugars improved and patient was 2 hours on a bear hugger she was still hypothermic with temperature of 67F.  That does make me concern for sepsis although lactic acid and white count is normal.  Chest x-ray showed mild edema but no obvious infiltrate. COVID and Flu swab pending. Will get blood cultures, procalcitonin, start broad spectrum abx for possible bacteremia and admit to Hospitalist.   I have reviewed patient's previous medical records and PMH.   _________________________ 7:21 AM on 10/21/2019 -----------------------------------------  Procalcitonin elevated concerning for sepsis.  Will hold off IV fluids since patient has missed a week of dialysis.  Discussed with Hospitalist for admission    _____________________________________________ Please note:  Patient was evaluated in Emergency Department today for the symptoms described in the history of present illness. Patient was evaluated in the context of the global COVID-19 pandemic, which necessitated consideration that the patient might be at risk for infection with the SARS-CoV-2 virus that causes COVID-19. Institutional protocols and algorithms that pertain to the evaluation of patients at risk for COVID-19 are in a state of rapid change based on information released by regulatory bodies including the CDC and federal and state organizations. These policies and algorithms were followed during the patient's care in the ED.  Some ED evaluations and interventions may be delayed as a result of limited staffing during the pandemic.   ____________________________________________   FINAL CLINICAL IMPRESSION(S) / ED  DIAGNOSES   Final diagnoses:  Hypothermia, initial encounter  Hypoglycemia  Sepsis with encephalopathy without septic shock, due to unspecified organism [facility])      NEW MEDICATIONS STARTED DURING THIS VISIT:  ED Discharge Orders    None       Note:  This document was prepared using Dragon voice recognition software and may include unintentional dictation errors.    Alfred Levins, [place], MD 10/21/19 [place], [place], MD 10/21/19 [phone]

## 2019-10-21 NOTE — ED Notes (Signed)
Pt ate 50% breakfast

## 2019-10-21 NOTE — ED Notes (Signed)
Family given pillow. NAD. Delays explained

## 2019-10-21 NOTE — ED Notes (Signed)
Meal tray ordered. Pt updated on plan of care.

## 2019-10-21 NOTE — H&P (Signed)
History and Physical    Cynthia Dean JGG:[id number] DOB: 1951-04-13 DOA: 10/21/2019  Referring MD/NP/PA:   PCP: Lowella Bandy, MD   Patient coming from:  The patient is coming from home.  At baseline, pt is independent for most of ADL.        Chief Complaint: Unresponsiveness and hypoglycemia  HPI: Cynthia Dean is a 69 y.o. female with medical history significant of ESRD-HD (MWF), hypertension, hyperlipidemia, COPD on 4 L oxygen, stroke, GERD, COVID-19 infection (07/30/2019), GI bleeding, CAD, carotid artery stenosis, seizure, who presents with unresponsiveness and hypoglycemia.  Per her fianc who is at bedside, pt took her evening insulin and had not eaten and went to bed because she was not feeling well. Later on, pt was found to be unresponsive. Police arrived at the scene and patient with agonal respirations and they started CPR. When EMS arrived patient had agonal respirations, but did have a pulse.  They check her blood glucose which was 21.  She received 250 cc of D10 and recheck blood glucose is 187.  Patient woke up after receiving sugar. Currently pt is alert and orientated x3.  No unilateral numbness or tingling static remedies been no facial droop or slurred speech.  Patient reports that she received her second dose of Covid vaccine 4 days ago. She has not felt well since receiving her second Covid shot 4 days ago. Patient has fatigue and generalized weakness. She has dry cough and mild SOB. No chest pain.  Patient does not have nausea vomiting, diarrhea or abdominal pain.  Patient states that she was given fat pads on 3/26 for possible UTI, but today patient does not have any symptoms of UTI. Patient missed her last 2 dialysis on Wednesday and Friday.  ED Course: pt was found to have WBC 8.0, lactic acid 1.1, negative COVID-19 PCR, potassium 4.7, bicarbonate of 23, creatinine 14.76, BUN 82, hypothermia with body temperature 95.2, blood pressure 138/72, heart  rate 52, RR 22, oxygen saturation 93% on 4 L home level of oxygen, chest x-ray showed increased interstitial coarsening marks.  Patient is placed on MedSurg bed of observation.  Review of Systems:   General: no fevers, chills, no body weight gain, has fatigue HEENT: no blurry vision, hearing changes or sore throat Respiratory: has dyspnea, coughing, no wheezing CV: no chest pain, no palpitations GI: no nausea, vomiting, abdominal pain, diarrhea, constipation GU: no dysuria, burning on urination, increased urinary frequency, hematuria  Ext: has trace leg edema Neuro: no unilateral weakness, numbness, or tingling, no vision change or hearing loss. Had unresponsiveness. Skin: no rash, no skin tear. MSK: No muscle spasm, no deformity, no limitation of range of movement in spin Heme: No easy bruising.  Travel history: No recent long distant travel.  Allergy:  Allergies  Allergen Reactions  . Hydrocodone     Intolerant more than allergic  . Tape Itching    Skin Dermatitis/itching (tape adhesive) Skin Dermatitis/itching (tape adhesive)  . Tapentadol Itching    Skin Dermatitis/itching (tape adhesive) Skin Dermatitis/itching (tape adhesive)     Past Medical History:  Diagnosis Date  . Acute on chronic respiratory failure with hypoxia ([place]) 07/30/2019  . Acute respiratory failure with hypoxia ([place]) 12/31/2018  . Carotid arterial disease ([place])    a. 02/2018 < bilat ICA stenosis.  [name] COPD (chronic obstructive pulmonary disease) ([place])   . Coronary artery disease    a. 11/2015 MV: EF 57%, no ischemia/infarct.  . Cryptogenic stroke ([place])  a. 10/2017 s/p implantable loop recorder (no Afib to date).  . Diabetes mellitus without complication ([place])   . Diarrhea   . ESRD on dialysis ([place])   . GI bleed    a. 01/2019 EGD/Colonoscopy: Mild gastritis. Colitis.  [name] Heart murmur    a. 12/2018 Echo: EF 55-60%, impaired relaxation. Mod dil LA. Mildly dil RA. Mild Ca2+ of AoV.  [name] Hematemesis 06/27/2017  .  History of 2019 novel coronavirus disease (COVID-19) 07/30/2019  . Hyperkalemia   . Hyperlipidemia   . Hypertension   . Intractable nausea and vomiting 08/08/2015  . Nausea & vomiting 05/30/2018  . Nausea and vomiting 05/29/2018  . Pneumonia due to COVID-19 virus     Past Surgical History:  Procedure Laterality Date  . A/V FISTULAGRAM Left 12/20/2016   Procedure: A/V Fistulagram;  Surgeon: Katha Cabal, MD;  Location: [facility];  Service: Cardiovascular;  Laterality: Left;  . A/V SHUNT INTERVENTION N/A 12/20/2016   Procedure: A/V Shunt Intervention;  Surgeon: Katha Cabal, MD;  Location: [facility];  Service: Cardiovascular;  Laterality: N/A;  . A/V SHUNTOGRAM Left 09/11/2017   Procedure: A/V SHUNTOGRAM;  Surgeon: Algernon Huxley, MD;  Location: [facility];  Service: Cardiovascular;  Laterality: Left;  . BREAST BIOPSY Bilateral 07/19/00   neg  . COLONOSCOPY WITH PROPOFOL N/A 02/16/2019   Procedure: COLONOSCOPY WITH PROPOFOL;  Surgeon: Toledo, Benay Pike, MD;  Location: [facility];  Service: Gastroenterology;  Laterality: N/A;  . ESOPHAGOGASTRODUODENOSCOPY (EGD) WITH PROPOFOL N/A 02/16/2019   Procedure: ESOPHAGOGASTRODUODENOSCOPY (EGD) WITH PROPOFOL;  Surgeon: Toledo, Benay Pike, MD;  Location: [facility];  Service: Gastroenterology;  Laterality: N/A;  . LOOP RECORDER INSERTION N/A 11/16/2017   Procedure: LOOP RECORDER INSERTION;  Surgeon: Deboraha Sprang, MD;  Location: [facility];  Service: Cardiovascular;  Laterality: N/A;  . PERIPHERAL VASCULAR CATHETERIZATION Left 02/02/2015   Procedure: A/V Shuntogram/Fistulagram;  Surgeon: Algernon Huxley, MD;  Location: [facility];  Service: Cardiovascular;  Laterality: Left;  . PERIPHERAL VASCULAR CATHETERIZATION Left 02/02/2015   Procedure: A/V Shunt Intervention;  Surgeon: Algernon Huxley, MD;  Location: [facility];  Service: Cardiovascular;  Laterality: Left;  . PERIPHERAL VASCULAR  CATHETERIZATION Left 03/09/2015   Procedure: A/V Shuntogram/Fistulagram;  Surgeon: Algernon Huxley, MD;  Location: [facility];  Service: Cardiovascular;  Laterality: Left;  . PERIPHERAL VASCULAR CATHETERIZATION N/A 03/09/2015   Procedure: A/V Shunt Intervention;  Surgeon: Algernon Huxley, MD;  Location: [facility];  Service: Cardiovascular;  Laterality: N/A;  . PERIPHERAL VASCULAR CATHETERIZATION Left 04/17/2015   Procedure: Upper Extremity Angiography;  Surgeon: Algernon Huxley, MD;  Location: [facility];  Service: Cardiovascular;  Laterality: Left;  . PERIPHERAL VASCULAR CATHETERIZATION  04/17/2015   Procedure: Upper Extremity Intervention;  Surgeon: Algernon Huxley, MD;  Location: [facility];  Service: Cardiovascular;;  . PERIPHERAL VASCULAR CATHETERIZATION N/A 08/10/2015   Procedure: A/V Shuntogram/Fistulagram;  Surgeon: Algernon Huxley, MD;  Location: [facility];  Service: Cardiovascular;  Laterality: N/A;  . PERIPHERAL VASCULAR CATHETERIZATION N/A 08/10/2015   Procedure: A/V Shunt Intervention;  Surgeon: Algernon Huxley, MD;  Location: [facility];  Service: Cardiovascular;  Laterality: N/A;  . PERIPHERAL VASCULAR CATHETERIZATION Left 04/11/2016   Procedure: A/V Shuntogram/Fistulagram;  Surgeon: Algernon Huxley, MD;  Location: [facility];  Service: Cardiovascular;  Laterality: Left;  . TEE WITHOUT CARDIOVERSION N/A 11/16/2017  Procedure: TRANSESOPHAGEAL ECHOCARDIOGRAM (TEE);  Surgeon: Minna Merritts, MD;  Location: [facility];  Service: Cardiovascular;  Laterality: N/A;    Social History:  reports that she has quit smoking. Her smoking use included cigarettes. She smoked 0.00 packs per day. She has never used smokeless tobacco. She reports that she does not drink alcohol or use drugs.  Family History:  Family History  Problem Relation Age of Onset  . Breast cancer Father 50  . Stroke Mother   . Heart attack Mother   . Heart Problems Sister   .  Breast cancer Cousin 38       1 st cousin. Maternal      Prior to Admission medications   Medication Sig Start Date End Date Taking? Authorizing Provider  ACCU-CHEK AVIVA PLUS test strip  08/19/19   [provider]  Accu-Chek Softclix Lancets lancets  08/19/19   [provider]  aspirin EC 325 MG tablet Take 325 mg by mouth daily. 05/28/19   [provider]  atorvastatin (LIPITOR) 80 MG tablet Take 80 mg by mouth every evening.     [provider]  Blood Glucose Monitoring Suppl (ACCU-CHEK AVIVA PLUS) w/Device KIT  08/19/19   [provider]  Blood Glucose Monitoring Suppl (FIFTY50 GLUCOSE METER 2.0) w/Device KIT Accu-Check Aviva. Use as instructed for bid glucose monitoring 08/18/19 08/17/20  [provider]  calcium acetate (PHOSLO) 667 MG capsule Take 3 capsules (2,001 mg total) by mouth 3 (three) times daily with meals. 08/11/19   Ezekiel Slocumb, DO  cephALEXin (KEFLEX) 500 MG capsule Take 1 capsule (500 mg total) by mouth 2 (two) times daily. 10/18/19   Harvest Dark, MD  cholecalciferol (VITAMIN D) 25 MCG tablet Take 1 tablet (1,000 Units total) by mouth daily. 08/11/19   Ezekiel Slocumb, DO  clopidogrel (PLAVIX) 75 MG tablet TAKE ONE TABLET BY MOUTH ONCE DAILY Patient taking differently: Take 75 mg by mouth daily.  06/14/16   Stegmayer, Joelene Millin A, PA-C  docusate sodium (COLACE) 100 MG capsule Take 1 capsule (100 mg total) by mouth 2 (two) times daily. 10/18/19 10/17/20  Harvest Dark, MD  fluticasone (FLONASE) 50 MCG/ACT nasal spray 2 sprays by Each Nare route daily. Prior to CPAP 08/07/18   [provider]  gabapentin (NEURONTIN) 100 MG capsule Take 2 capsules (200 mg total) by mouth 3 (three) times daily. Patient taking differently: Take 100 mg by mouth 3 (three) times daily.  03/28/19 03/27/20  Rudene Re, MD  glucose blood (ACCU-CHEK AVIVA PLUS) test strip Use as directed for twice/day glucose monitoring 08/18/19    [provider]  hydrocortisone (ANUSOL-HC) 2.5 % rectal cream Place 1 application rectally 2 (two) times daily. 09/09/19   Lin Landsman, MD  insulin NPH-regular Human (NOVOLIN 70/30) (70-30) 100 UNIT/ML injection Inject 25 Units into the skin 2 (two) times daily with a meal. Except dialysis days. Do not take on Tuesdays, Thursdays, and Saturdays. 11/17/17   Gouru, Illene Silver, MD  Insulin Syringe-Needle U-100 (BD VEO INSULIN SYRINGE U/F) 31G X 15/64" 0.5 ML MISC USE AS DIRECTED TWICE DAILY 08/18/19   [provider]  Ipratropium-Albuterol (COMBIVENT) 20-100 MCG/ACT AERS respimat Inhale 1 puff into the lungs every 6 (six) hours. 08/11/19   Ezekiel Slocumb, DO  levETIRAcetam (KEPPRA) 1000 MG tablet Take 1 tablet (1,000 mg total) by mouth daily. 03/15/18   Vaughan Basta, MD  lidocaine-prilocaine (EMLA) cream Apply 1 application topically as needed (for port access).  [provider]  losartan (COZAAR) 100 MG tablet Take 100 mg by mouth daily. Takes in the morning 09/12/17   [provider]  metoprolol succinate (TOPROL-XL) 100 MG 24 hr tablet Take 100 mg by mouth daily. Take with or immediately following a meal.     [provider]  omeprazole (PRILOSEC) 20 MG capsule Take 20 mg by mouth 2 (two) times daily.     [provider]  oxyCODONE-acetaminophen (PERCOCET) 5-325 MG tablet Take 1 tablet by mouth every 4 (four) hours as needed for severe pain. 10/18/19   Harvest Dark, MD  torsemide (DEMADEX) 100 MG tablet Take 100 mg by mouth daily. 10/10/17   [provider]    Physical Exam: Vitals:   10/21/19 0630 10/21/19 0700 10/21/19 0730 10/21/19 0902  BP: 139/72 (!) 109/52 (!) 109/57 (!) 147/68  Pulse: 65 68 69 64  Resp: _0 Temp: (!) 95.2 F (35.1 C) (!) 96.9 F (36.1 C)    TempSrc: Rectal Axillary    SpO2: 100% 99% 100% 100%  Weight:      Height:       General: Not in acute distress HEENT:       Eyes:  PERRL, EOMI, no scleral icterus.       ENT: No discharge from the ears and nose, no pharynx injection, no tonsillar enlargement.        Neck: No JVD, no bruit, no mass felt. Heme: No neck lymph node enlargement. Cardiac: S1/S2, RRR, No murmurs, No gallops or rubs. Respiratory: No rales, wheezing, rhonchi or rubs. GI: Soft, nondistended, nontender, no rebound pain, no organomegaly, BS present. GU: No hematuria Ext: has trace leg edema bilaterally. 2+DP/PT pulse bilaterally. Musculoskeletal: No joint deformities, No joint redness or warmth, no limitation of ROM in spin. Skin: No rashes.  Neuro: Alert, oriented X3, cranial nerves II-XII grossly intact, moves all extremities normally.  Psych: Patient is not psychotic, no suicidal or hemocidal ideation.  Labs on Admission: I have personally reviewed following labs and imaging studies  CBC: Recent Labs  Lab 10/18/19 1303 10/21/19 0325  WBC 11.4* 8.0  NEUTROABS 8.8* 5.6  HGB 9.9* 9.8*  HCT 32.2* 33.7*  MCV 89.4 94.7  PLT 268 038   Basic Metabolic Panel: Recent Labs  Lab 10/18/19 1303 10/21/19 0325  NA 134* 137  K 5.4* 4.7  CL 92* 95*  CO2 23 23  GLUCOSE 188* 148*  BUN 54* 82*  CREATININE 10.92* 14.76*  CALCIUM 9.4 9.9   GFR: Estimated Creatinine Clearance: 5.4 mL/min (A) (by C-G formula based on SCr of 14.76 mg/dL (H)). Liver Function Tests: Recent Labs  Lab 10/18/19 1303 10/21/19 0325  AST 14* 11*  ALT 13 10  ALKPHOS 81 74  BILITOT 0.8 0.6  PROT 8.4* 8.2*  ALBUMIN 3.9 4.0   No results for input(s): LIPASE, AMYLASE in the last 168 hours. No results for input(s): AMMONIA in the last 168 hours. Coagulation Profile: No results for input(s): INR, PROTIME in the last 168 hours. Cardiac Enzymes: No results for input(s): CKTOTAL, CKMB, CKMBINDEX, TROPONINI in the last 168 hours. BNP (last 3 results) No results for input(s): PROBNP in the last 8760 hours. HbA1C: No results for input(s): HGBA1C in the last 72  hours. CBG: Recent Labs  Lab 10/21/19 0242 10/21/19 0441 10/21/19 0625 10/21/19 0809  GLUCAP 142* 101* 72 113*   Lipid Profile: No results for input(s): CHOL, HDL, LDLCALC, TRIG, CHOLHDL, LDLDIRECT in the last 72 hours.  Thyroid Function Tests: No results for input(s): TSH, T4TOTAL, FREET4, T3FREE, THYROIDAB in the last 72 hours. Anemia Panel: No results for input(s): VITAMINB12, FOLATE, FERRITIN, TIBC, IRON, RETICCTPCT in the last 72 hours. Urine analysis:    Component Value Date/Time   COLORURINE YELLOW (A) 10/21/2019 0249   APPEARANCEUR HAZY (A) 10/21/2019 0249   APPEARANCEUR Clear 06/14/2014 1510   LABSPEC 1.012 10/21/2019 0249   LABSPEC 1.006 06/14/2014 1510   PHURINE 7.0 10/21/2019 0249   GLUCOSEU NEGATIVE 10/21/2019 0249   GLUCOSEU >=500 06/14/2014 1510   HGBUR NEGATIVE 10/21/2019 0249   BILIRUBINUR NEGATIVE 10/21/2019 0249   BILIRUBINUR Negative 06/14/2014 [place] 10/21/2019 0249   PROTEINUR 100 (A) 10/21/2019 0249   NITRITE NEGATIVE 10/21/2019 0249   LEUKOCYTESUR NEGATIVE 10/21/2019 0249   LEUKOCYTESUR Negative 06/14/2014 1510   Sepsis Labs: _0 (procalcitonin:4,lacticidven:4) ) Recent Results (from the past 240 hour(s))  Urine culture     Status: Abnormal   Collection Time: 10/18/19 10:44 PM   Specimen: Urine, Random  Result Value Ref Range Status   Specimen Description   Final    URINE, RANDOM Performed at [facility], [address], [place], [place] [address]    Special Requests   Final    NONE Performed at [facility], [address], [place], [place] [address]    Culture MULTIPLE SPECIES PRESENT, SUGGEST RECOLLECTION (A)  Final   Report Status 10/20/2019 FINAL  Final     Radiological Exams on Admission: DG Chest Portable 1 View  Result Date: 10/21/2019 CLINICAL DATA:  Cough EXAM: PORTABLE CHEST 1 VIEW COMPARISON:  Three days ago FINDINGS: Generalized interstitial coarsening. Normal heart size. An  implantable loop recorder is present. Aortic atherosclerosis. There is no edema, consolidation, effusion, or pneumothorax. IMPRESSION: Interstitial coarsening that could be congestive or bronchitic. Electronically Signed   By: Monte Fantasia M.D.   On: 10/21/2019 06:41     EKG: Independently reviewed.  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  Likely due to continuation of her insulin and without appropriate eating food.  -place on MedSurg bed for observation -Check CBG every hour -D50 as needed -D10 at 50 cc/h -Hold 70/30 insulin  Unresponsiveness: Patient responded to hypoglycemia treatment quickly. Most likely due to hypoglycemia.  No focal neurologic deficit on physical examination.  Currently patient is alert oriented x3. -Frequent neuro checks -Treat hypoglycemia as above  HTN:  -Continue home medications: Metoprolol, torsemide -hydralazine prn  Hyperlipidemia -lipitor  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[place]): -hold home 70/30 insulin  CAD (coronary artery disease): no CP -Continue aspirin, Plavix, Lipitor, metoprolol  COPD (chronic obstructive pulmonary disease) (HCC) -Bronchodilators  ESRD on dialysis (MWF): Patient missed 2 sessions of dialysis. -Dr. Holley Raring of renal was consulted for dialysis  History of stroke [facility]): -Aspirin, Plavix, Lipitor  Seizure -Seizure precaution -When necessary Ativan for seizure -Continue Home medications: Keppra  GERD (gastroesophageal reflux disease): -Protonix   DVT ppx:  SCD Code Status: Full code Family Communication:  Yes, patient's fianc  at bed side Disposition Plan:  Anticipate discharge back to previous home environment Consults called:  Renal, Dr. Holley Raring Admission status: Med-surg bed for obs   Date of Service 10/21/2019    [facility]   If 7PM-7AM, please contact night-coverage [email or web address] 10/21/2019, 9:03 AM

## 2019-10-21 NOTE — ED Notes (Signed)
Pt placed under bear hugger due to low core temp of 93.2 rectal. Dr Alfred Levins notified

## 2019-10-21 NOTE — ED Notes (Signed)
Pt disoriented to time only.  Assisted to toilet. Large bowel movement, diarrhea like.  Family at bedside.

## 2019-10-21 NOTE — ED Notes (Signed)
Lab contacted to collect pt's lab due to pt being difficult stick.

## 2019-10-21 NOTE — ED Notes (Signed)
Pt given orange juice and crackers with peanut butter as directed by Dr Alfred Levins. Will repeat blood sugar at 0600

## 2019-10-21 NOTE — ED Notes (Signed)
Very difficult stick. Lab contacted to draw sepsis labs and blood cultures.

## 2019-10-21 NOTE — ED Notes (Signed)
Phlebotomy at bedside attempting cultures.  Will start abx after blood cultures.

## 2019-10-21 NOTE — ED Notes (Signed)
Pt given lunch tray. Call bell within reach. Stretcher locked in lowest position

## 2019-10-21 NOTE — ED Notes (Signed)
Meal tray given and set up for pt.

## 2019-10-21 NOTE — ED Triage Notes (Signed)
Pt here via [facility] from home. EMS states pt took her insulin today without eating, husband went to check on her this evening and she was found unresponsive. [place] PD officer initiated CPR.   EMS states pt's cbg was 21 upon arrival, unresponsive with snoring respirations.  Pt received 231ml D10, cbg rechecked and was 187.   Pt was recently here for a UTI. PT is a dialysis pt and missed her dialysis Wednesday and Friday.   CBG is 142 on arrival.

## 2019-10-21 NOTE — ED Notes (Signed)
IV antibiotics held until lab arrives to draw cultures.

## 2019-10-22 ENCOUNTER — Other Ambulatory Visit: Payer: Self-pay

## 2019-10-22 DIAGNOSIS — I2510 Atherosclerotic heart disease of native coronary artery without angina pectoris: Secondary | ICD-10-CM | POA: Diagnosis present

## 2019-10-22 DIAGNOSIS — Z992 Dependence on renal dialysis: Secondary | ICD-10-CM | POA: Diagnosis not present

## 2019-10-22 DIAGNOSIS — R569 Unspecified convulsions: Secondary | ICD-10-CM | POA: Diagnosis not present

## 2019-10-22 DIAGNOSIS — E11649 Type 2 diabetes mellitus with hypoglycemia without coma: Secondary | ICD-10-CM | POA: Diagnosis present

## 2019-10-22 DIAGNOSIS — I5032 Chronic diastolic (congestive) heart failure: Secondary | ICD-10-CM | POA: Diagnosis present

## 2019-10-22 DIAGNOSIS — N186 End stage renal disease: Secondary | ICD-10-CM | POA: Diagnosis present

## 2019-10-22 DIAGNOSIS — E162 Hypoglycemia, unspecified: Secondary | ICD-10-CM | POA: Diagnosis present

## 2019-10-22 DIAGNOSIS — N390 Urinary tract infection, site not specified: Secondary | ICD-10-CM | POA: Diagnosis not present

## 2019-10-22 DIAGNOSIS — E1122 Type 2 diabetes mellitus with diabetic chronic kidney disease: Secondary | ICD-10-CM | POA: Diagnosis present

## 2019-10-22 DIAGNOSIS — Z8673 Personal history of transient ischemic attack (TIA), and cerebral infarction without residual deficits: Secondary | ICD-10-CM | POA: Diagnosis not present

## 2019-10-22 DIAGNOSIS — N2581 Secondary hyperparathyroidism of renal origin: Secondary | ICD-10-CM | POA: Diagnosis present

## 2019-10-22 DIAGNOSIS — Z794 Long term (current) use of insulin: Secondary | ICD-10-CM | POA: Diagnosis not present

## 2019-10-22 DIAGNOSIS — M5416 Radiculopathy, lumbar region: Secondary | ICD-10-CM | POA: Diagnosis present

## 2019-10-22 DIAGNOSIS — Z7902 Long term (current) use of antithrombotics/antiplatelets: Secondary | ICD-10-CM | POA: Diagnosis not present

## 2019-10-22 DIAGNOSIS — Z20822 Contact with and (suspected) exposure to covid-19: Secondary | ICD-10-CM | POA: Diagnosis present

## 2019-10-22 DIAGNOSIS — J449 Chronic obstructive pulmonary disease, unspecified: Secondary | ICD-10-CM | POA: Diagnosis present

## 2019-10-22 DIAGNOSIS — Z79899 Other long term (current) drug therapy: Secondary | ICD-10-CM | POA: Diagnosis not present

## 2019-10-22 DIAGNOSIS — K219 Gastro-esophageal reflux disease without esophagitis: Secondary | ICD-10-CM | POA: Diagnosis present

## 2019-10-22 DIAGNOSIS — E785 Hyperlipidemia, unspecified: Secondary | ICD-10-CM | POA: Diagnosis present

## 2019-10-22 DIAGNOSIS — I132 Hypertensive heart and chronic kidney disease with heart failure and with stage 5 chronic kidney disease, or end stage renal disease: Secondary | ICD-10-CM | POA: Diagnosis present

## 2019-10-22 DIAGNOSIS — E11319 Type 2 diabetes mellitus with unspecified diabetic retinopathy without macular edema: Secondary | ICD-10-CM | POA: Diagnosis present

## 2019-10-22 DIAGNOSIS — G9340 Encephalopathy, unspecified: Secondary | ICD-10-CM | POA: Diagnosis present

## 2019-10-22 DIAGNOSIS — I452 Bifascicular block: Secondary | ICD-10-CM | POA: Diagnosis present

## 2019-10-22 DIAGNOSIS — Z6841 Body Mass Index (BMI) 40.0 and over, adult: Secondary | ICD-10-CM | POA: Diagnosis not present

## 2019-10-22 DIAGNOSIS — Z7982 Long term (current) use of aspirin: Secondary | ICD-10-CM | POA: Diagnosis not present

## 2019-10-22 DIAGNOSIS — Z8616 Personal history of COVID-19: Secondary | ICD-10-CM | POA: Diagnosis not present

## 2019-10-22 LAB — TROPONIN I (HIGH SENSITIVITY)
Troponin I (High Sensitivity): 15 ng/L (ref <18)
Troponin I (High Sensitivity): 14 ng/L (ref <18)

## 2019-10-22 LAB — BASIC METABOLIC PANEL
Anion gap: 22 — ABNORMAL HIGH (ref 5–15)
BUN: 92 mg/dL — ABNORMAL HIGH (ref 8–23)
CO2: 20 mmol/L — ABNORMAL LOW (ref 22–32)
Calcium: 9.1 mg/dL (ref 8.9–10.3)
Chloride: 92 mmol/L — ABNORMAL LOW (ref 98–111)
Creatinine, Ser: 16.39 mg/dL — ABNORMAL HIGH (ref 0.44–1.00)
GFR calc Af Amer: 2 mL/min — ABNORMAL LOW (ref >60)
GFR calc non Af Amer: 2 mL/min — ABNORMAL LOW (ref >60)
Glucose, Bld: 157 mg/dL — ABNORMAL HIGH (ref 70–99)
Potassium: 7 mmol/L (ref 3.5–5.1)
Sodium: 134 mmol/L — ABNORMAL LOW (ref 135–145)

## 2019-10-22 LAB — GLUCOSE, CAPILLARY
Glucose-Capillary: 109 mg/dL — ABNORMAL HIGH (ref 70–99)
Glucose-Capillary: 111 mg/dL — ABNORMAL HIGH (ref 70–99)
Glucose-Capillary: 144 mg/dL — ABNORMAL HIGH (ref 70–99)
Glucose-Capillary: 148 mg/dL — ABNORMAL HIGH (ref 70–99)
Glucose-Capillary: 151 mg/dL — ABNORMAL HIGH (ref 70–99)
Glucose-Capillary: 179 mg/dL — ABNORMAL HIGH (ref 70–99)
Glucose-Capillary: 99 mg/dL (ref 70–99)

## 2019-10-22 LAB — CBC
HCT: 32.5 % — ABNORMAL LOW (ref 36.0–46.0)
Hemoglobin: 10.1 g/dL — ABNORMAL LOW (ref 12.0–15.0)
MCH: 27.7 pg (ref 26.0–34.0)
MCHC: 31.1 g/dL (ref 30.0–36.0)
MCV: 89 fL (ref 80.0–100.0)
Platelets: 235 10*3/uL (ref 150–400)
RBC: 3.65 MIL/uL — ABNORMAL LOW (ref 3.87–5.11)
RDW: 15.9 % — ABNORMAL HIGH (ref 11.5–15.5)
WBC: 9.1 10*3/uL (ref 4.0–10.5)
nRBC: 0 % (ref 0.0–0.2)

## 2019-10-22 MED ORDER — DEXTROSE 50 % IV SOLN
1.0000 | Freq: Once | INTRAVENOUS | Status: AC
  Administered 2019-10-22: 50 mL via INTRAVENOUS
  Filled 2019-10-22: qty 50

## 2019-10-22 MED ORDER — INSULIN ASPART 100 UNIT/ML IV SOLN
10.0000 [IU] | Freq: Once | INTRAVENOUS | Status: AC
  Administered 2019-10-22: 10 [IU] via INTRAVENOUS
  Filled 2019-10-22: qty 0.1

## 2019-10-22 MED ORDER — SODIUM ZIRCONIUM CYCLOSILICATE 10 G PO PACK
10.0000 g | PACK | Freq: Once | ORAL | Status: AC
  Administered 2019-10-22: 10 g via ORAL
  Filled 2019-10-22: qty 1

## 2019-10-22 NOTE — Progress Notes (Signed)
HD treatment completed. 

## 2019-10-22 NOTE — Progress Notes (Signed)
Patient complaining of severe cramping in her abdomen. I gave her some percocet and hydralazine because her blood pressure was elevated.Blood sugars have remained in the 100 range. Will continue to monitor.  Cynthia Dean  10/22/2019  4:51 AM

## 2019-10-22 NOTE — Progress Notes (Signed)
PROGRESS NOTE    Cynthia Dean  CXK:[id number] DOB: 1950-11-29 DOA: 10/21/2019 PCP: Lowella Bandy, MD   Brief Narrative: HPI: Cynthia Dean is a 69 y.o. female with medical history significant of ESRD-HD (MWF), hypertension, hyperlipidemia, COPD on 4 L oxygen, stroke, GERD, COVID-19 infection (07/30/2019), GI bleeding, CAD, carotid artery stenosis, seizure, who presents with unresponsiveness and hypoglycemia.  Per her fianc who is at bedside, pt took her evening insulin and had not eaten and went to bed because she was not feeling well. Later on, pt was found to be unresponsive. Police arrived at the scene and patient with agonal respirations and they started CPR. When EMS arrivedpatient had agonal respirations, but did have a pulse. They check her blood glucose which was 21. She received 250 cc of D10 and recheck blood glucose is 187. Patient woke up after receiving sugar. Currently pt is alert and orientated x3.  No unilateral numbness or tingling static remedies been no facial droop or slurred speech.  Patient reports that she received her second dose of Covid vaccine 4 days ago. She has not felt well since receiving her second Covid shot 4 days ago. Patient has fatigue and generalized weakness. She has dry cough and mild SOB. No chest pain.  Patient does not have nausea vomiting, diarrhea or abdominal pain.  Patient states that she was given fat pads on 3/26 for possible UTI, but today patient does not have any symptoms of UTI. Patient missed her last 2 dialysis on Wednesday and Friday.  3/30: Patient seen and examined.  Marked hyperkalemia noted this morning.  Potassium 7.0.  My arrival patient seems lethargic and confused.  She is on the phone however I did not appear to be anybody on the other line.  Sugars have improved.  No further hypoglycemia noted.  Case discussed with nephrology Dr. Holley Raring.  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   Likely due to continuation of her insulin and without appropriate eating food. Plan: -Check CBG every 2 hours -D50 as needed -D10 at 50 cc/h -Hold 70/30 insulin --Can consider restarting insulin regimen tomorrow if blood sugars improved  Unresponsiveness Encephalopathy  Patient responded to hypoglycemia treatment quickly.  Most likely due to hypoglycemia.   No focal neurologic deficit on physical examination.   -10/22/2019: Currently the patient is is mildly confused.  Etiology is unclear.  Possibly related to uremia.  Will dialyze today.   ESRD on dialysis (MWF) Hyperkalemia Patient missed 2 sessions of dialysis. Dr. Holley Raring consulted for HD needs 10 g Lokelma x1 10 units IV insulin/1 ampoule D50 Repeat BMP in the morning  HTN:  -Continue home medications: Metoprolol, torsemide -hydralazine prn  Hyperlipidemia -lipitor  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[place]): -hold home 70/30 insulin  CAD (coronary artery disease): no CP -Continue aspirin, Plavix, Lipitor, metoprolol  COPD (chronic obstructive pulmonary disease) (HCC) -Bronchodilators    History of stroke ([place]): -Aspirin, Plavix, Lipitor  Seizure -Seizure precaution -When necessary Ativan for seizure -Continue Home medications: Keppra  GERD (gastroesophageal  reflux disease): -Protonix   DVT prophylaxis: SCDs Code Status: Full code Family Communication: None today Disposition Plan: Anticipate return to previous home environment.  Will recheck serum potassium in the  morning.  We will also need to reassess mental status.  Serum potassium improved and mental status back at baseline and no episodes of hypoglycemia noted overnight patient can likely discharge home to previous home environment on 10/23/2019.  Consultants:   Nephrology- Holley Raring  Procedures:  none  Antimicrobials:   none   Subjective: Patient seen and examined Confused my evaluation Denies any pain  Objective: Vitals:   10/22/19 1400 10/22/19 1415 10/22/19 1430 10/22/19 1445  BP: (!) 141/53 (!) 141/61 125/63 (!) 132/54  Pulse: 66 66 66 70  Resp: 17 13 (!) 32 14  Temp:      TempSrc:      SpO2:      Weight:      Height:        Intake/Output Summary (Last 24 hours) at 10/22/2019 1501 Last data filed at 10/22/2019 0945 Gross per 24 hour  Intake 0 ml  Output 250 ml  Net -250 ml   Filed Weights   10/21/19 0244  Weight: 136.1 kg    Examination:  General exam: Appears calm and comfortable, confused Respiratory system: Clear to auscultation. Respiratory effort normal. Cardiovascular system: S1 & S2 heard, RRR. No JVD, murmurs, rubs, gallops or clicks.  Trace edema bilaterally. Gastrointestinal system: Abdomen is nondistended, soft and nontender. No organomegaly or masses felt. Normal bowel sounds heard. Central nervous system: Alert and oriented. No focal neurological deficits. Extremities: Symmetric 5 x 5 power.  AV fistula Skin: No rashes, lesions or ulcers Psychiatry: Confused    Data Reviewed: I have personally reviewed following labs and imaging studies  CBC: Recent Labs  Lab 10/18/19 1303 10/21/19 0325 10/22/19 0636  WBC 11.4* 8.0 9.1  NEUTROABS 8.8* 5.6  --   HGB 9.9* 9.8* 10.1*  HCT 32.2* 33.7* 32.5*  MCV 89.4 94.7 89.0  PLT 268 197 976   Basic Metabolic Panel: Recent Labs  Lab 10/18/19 1303 10/21/19 0325 10/22/19 0636  NA 134* 137 134*  K 5.4* 4.7 7.0*  CL 92* 95* 92*  CO2 23 23 20*  GLUCOSE 188* 148* 157*  BUN 54* 82* 92*  CREATININE  10.92* 14.76* 16.39*  CALCIUM 9.4 9.9 9.1   GFR: Estimated Creatinine Clearance: 4.9 mL/min (A) (by C-G formula based on SCr of 16.39 mg/dL (H)). Liver Function Tests: Recent Labs  Lab 10/18/19 1303 10/21/19 0325  AST 14* 11*  ALT 13 10  ALKPHOS 81 74  BILITOT 0.8 0.6  PROT 8.4* 8.2*  ALBUMIN 3.9 4.0   No results for input(s): LIPASE, AMYLASE in the last 168 hours. No results for input(s): AMMONIA in the last 168 hours. Coagulation Profile: No results for input(s): INR, PROTIME in the last 168 hours. Cardiac Enzymes: No results for input(s): CKTOTAL, CKMB, CKMBINDEX, TROPONINI in the last 168 hours. BNP (last 3 results) No results for input(s): PROBNP in the last 8760 hours. HbA1C: No results for input(s): HGBA1C in the last 72 hours. CBG: Recent Labs  Lab 10/22/19 0229 10/22/19 0405 10/22/19 0601 10/22/19 0844 10/22/19 1107  GLUCAP 109* 144* 148* 151* 179*   Lipid Profile: No results for input(s): CHOL, HDL, LDLCALC, TRIG, CHOLHDL, LDLDIRECT in the last 72 hours. Thyroid Function Tests: No results for input(s): TSH, T4TOTAL, FREET4, T3FREE, THYROIDAB in the last 72 hours. Anemia Panel: No results for input(s): VITAMINB12, FOLATE,  FERRITIN, TIBC, IRON, RETICCTPCT in the last 72 hours. Sepsis Labs: Recent Labs  Lab 10/21/19 0258 10/21/19 0325  PROCALCITON 1.66  --   LATICACIDVEN  --  1.1    Recent Results (from the past 240 hour(s))  Urine culture     Status: Abnormal   Collection Time: 10/18/19 10:44 PM   Specimen: Urine, Random  Result Value Ref Range Status   Specimen Description   Final    URINE, RANDOM Performed at [facility], [address] [place], [place] [address]    Special Requests   Final    NONE Performed at [facility], [address] [place], [place] [address]    Culture MULTIPLE SPECIES PRESENT, SUGGEST RECOLLECTION (A)  Final   Report Status 10/20/2019 FINAL  Final  Respiratory Panel by RT PCR (Flu A&B, Covid)  - Nasopharyngeal Swab     Status: None   Collection Time: 10/21/19  6:24 AM   Specimen: Nasopharyngeal Swab  Result Value Ref Range Status   SARS Coronavirus 2 by RT PCR NEGATIVE NEGATIVE Final    Comment: (NOTE) SARS-CoV-2 target nucleic acids are NOT DETECTED. The SARS-CoV-2 RNA is generally detectable in upper respiratoy specimens during the acute phase of infection. The lowest concentration of SARS-CoV-2 viral copies this assay can detect is 131 copies/mL. A negative result does not preclude SARS-Cov-2 infection and should not be used as the sole basis for treatment or other patient management decisions. A negative result may occur with  improper specimen collection/handling, submission of specimen other than nasopharyngeal swab, presence of viral mutation(s) within the areas targeted by this assay, and inadequate number of viral copies (<131 copies/mL). A negative result must be combined with clinical observations, patient history, and epidemiological information. The expected result is Negative. Fact Sheet for Patients:  PinkCheek.be Fact Sheet for Healthcare Providers:  GravelBags.it This test is not yet ap proved or cleared by the [place] FDA and  has been authorized for detection and/or diagnosis of SARS-CoV-2 by FDA under an Emergency Use Authorization (EUA). This EUA will remain  in effect (meaning this test can be used) for the duration of the COVID-19 declaration under Section 564(b)(1) of the Act, 21 U.S.C. section 360bbb-3(b)(1), unless the authorization is terminated or revoked sooner.    Influenza A by PCR NEGATIVE NEGATIVE Final   Influenza B by PCR NEGATIVE NEGATIVE Final    Comment: (NOTE) The Xpert Xpress SARS-CoV-2/FLU/RSV assay is intended as an aid in  the diagnosis of influenza from Nasopharyngeal swab specimens and  should not be used as a sole basis for treatment. Nasal washings and    aspirates are unacceptable for Xpert Xpress SARS-CoV-2/FLU/RSV  testing. Fact Sheet for Patients: PinkCheek.be Fact Sheet for Healthcare Providers: GravelBags.it This test is not yet approved or cleared by the [place] FDA and  has been authorized for detection and/or diagnosis of SARS-CoV-2 by  FDA under an Emergency Use Authorization (EUA). This EUA will remain  in effect (meaning this test can be used) for the duration of the  Covid-19 declaration under Section 564(b)(1) of the Act, 21  U.S.C. section 360bbb-3(b)(1), unless the authorization is  terminated or revoked. Performed at [facility], [address] [place],  [address]   Blood culture (routine x 2)     Status: None (Preliminary result)   Collection Time: 10/21/19  7:25 AM   Specimen: BLOOD  Result Value Ref Range Status   Specimen Description BLOOD RIGHT ARM  Final  Special Requests   Final    BOTTLES DRAWN AEROBIC AND ANAEROBIC Blood Culture adequate volume   Culture   Final    NO GROWTH < 24 HOURS Performed at [facility], [address] [place], [place] [address]    Report Status PENDING  Incomplete  Blood culture (routine x 2)     Status: None (Preliminary result)   Collection Time: 10/21/19  7:45 AM   Specimen: BLOOD  Result Value Ref Range Status   Specimen Description BLOOD RIGHT HAND  Final   Special Requests   Final    BOTTLES DRAWN AEROBIC AND ANAEROBIC Blood Culture adequate volume   Culture   Final    NO GROWTH < 24 HOURS Performed at [facility], [address], [place], [place] [address]    Report Status PENDING  Incomplete         Radiology Studies: DG Chest Portable 1 View  Result Date: 10/21/2019 CLINICAL DATA:  Cough EXAM: PORTABLE CHEST 1 VIEW COMPARISON:  Three days ago FINDINGS: Generalized interstitial coarsening. Normal heart size. An implantable loop recorder is present. Aortic  atherosclerosis. There is no edema, consolidation, effusion, or pneumothorax. IMPRESSION: Interstitial coarsening that could be congestive or bronchitic. Electronically Signed   By: Monte Fantasia M.D.   On: 10/21/2019 06:41        Scheduled Meds: . aspirin EC  325 mg Oral Daily  . atorvastatin  80 mg Oral QPM  . calcium acetate  2,001 mg Oral TID WC  . cholecalciferol  1,000 Units Oral Daily  . clopidogrel  75 mg Oral Daily  . dextromethorphan-guaiFENesin  1 tablet Oral BID  . docusate sodium  100 mg Oral BID  . fluticasone  1 spray Each Nare Daily  . gabapentin  100 mg Oral QHS  . Ipratropium-Albuterol  1 puff Inhalation Q6H  . levETIRAcetam  1,000 mg Oral Daily  . losartan  100 mg Oral Daily  . mouth rinse  15 mL Mouth Rinse BID  . metoprolol succinate  100 mg Oral Daily  . pantoprazole  40 mg Oral Daily  . torsemide  100 mg Oral Daily   Continuous Infusions:   LOS: 0 days    Time spent: 35 minutes    Sidney Ace, MD [facility] Pager [phone]  If 7PM-7AM, please contact night-coverage 10/22/2019, 3:01 PM

## 2019-10-22 NOTE — Progress Notes (Signed)
HD treatment initiated. 

## 2019-10-22 NOTE — TOC Initial Note (Signed)
Transition of Care [facility]) - Initial/Assessment Note    Patient Details  Name: Cynthia Dean MRN: [id number] Date of Birth: 1951/01/25  [facility] CM/SW Contact:    [name], RN Phone Number: 10/22/2019, 10:07 AM  Clinical Narrative:                 Patient admitted from home with hypoglycemia  Patient A&O x2 Assessment completed via phone with [name].  RNCM attempted to contact patient's daughter MS. Owens Shark.  Voicemail was not set up  Patient lives at home with Fiance  Patient is established outpatient HD. Missed 2 outpatient HD sessions due to not feeling well after the covid vaccine  PCP [name] - daughter provides transportation  [place] - Denies issues obtaining medications   Patient has chronic O2, RW, and BSC in the home   Plan to return home at discharge.  RNCM   RNCM requested MD to enter PT eval if appropraite.  Per Fiance patient was open with [facility].  Services ended 10/10/19   Expected Discharge Plan: Home/Self Care     Patient Goals and CMS Choice        Expected Discharge Plan and Services Expected Discharge Plan: Home/Self Care   Discharge Planning Services: CM Consult   Living arrangements for the past 2 months: Single Family Home                                      Prior Living Arrangements/Services Living arrangements for the past 2 months: Single Family Home Lives with:: Significant Other Patient language and need for interpreter reviewed:: Yes Do you feel safe going back to the place where you live?: Yes      Need for Family Participation in Patient Care: Yes (Comment) Care giver support system in place?: Yes (comment) Current home services: DME Criminal Activity/Legal Involvement Pertinent to Current Situation/Hospitalization: No - Comment as needed  Activities of Daily Living Home Assistive Devices/Equipment: None ADL Screening (condition at time of admission) Patient's  cognitive ability adequate to safely complete daily activities?: Yes Is the patient deaf or have difficulty hearing?: No Does the patient have difficulty seeing, even when wearing glasses/contacts?: No Does the patient have difficulty concentrating, remembering, or making decisions?: No Patient able to express need for assistance with ADLs?: Yes Does the patient have difficulty dressing or bathing?: No Independently performs ADLs?: Yes (appropriate for developmental age) Does the patient have difficulty walking or climbing stairs?: No Weakness of Legs: None Weakness of Arms/Hands: None  Permission Sought/Granted                  Emotional Assessment           Psych Involvement: No (comment)  Admission diagnosis:  Hypoglycemia [E16.2] Hypothermia, initial encounter [T68.XXXA] Sepsis with encephalopathy without septic shock, due to unspecified organism ([place]) [A41.9, R65.20, G93.40] Patient Active Problem List   Diagnosis Date Noted  . Hypoglycemia 10/21/2019  . Unresponsiveness 10/21/2019  . GERD (gastroesophageal reflux disease) 10/21/2019  . Hypothermia   . Sepsis with encephalopathy without septic shock ([place])   . Acute metabolic encephalopathy   . Acute on chronic diastolic CHF (congestive heart failure) ([place])   . Rectal bleed 02/13/2019  . Aphasia as late effect of cerebrovascular accident (CVA) 05/01/2018  . Seizure ([place]) 03/14/2018  . Stroke ([place]) 03/07/2018  . Slurred speech 11/14/2017  . Type  2 diabetes mellitus with ESRD (end-stage renal disease) ([place]) 11/12/2017  . CAD (coronary artery disease) 11/12/2017  . COPD (chronic obstructive pulmonary disease) ([place]) 11/12/2017  . ESRD on dialysis ([place]) 11/12/2017  . CVA (cerebral vascular accident) ([place]) 04/14/2016  . Essential hypertension 11/27/2015  . Hyperlipidemia 11/27/2015  . Prolonged Q-T interval on ECG 11/26/2015  . Aphasia complicating stroke 10/13/2246  . Cervical spine arthritis 07/27/2015  .  Diabetic retinopathy without macular edema associated with type 2 diabetes mellitus ([place]) 07/27/2015  . Osteoarthritis of spine with radiculopathy, lumbar region 07/27/2015  . Obesity, Class III, BMI 40-49.9 (morbid obesity) ([place]) 07/27/2015  . Vitamin D deficiency 01/21/2011  . Diastolic heart failure ([place]) 10/27/2010   PCP:  Lowella Bandy, MD Pharmacy:   [facility] [address], [place] [address] Phone: [phone] Fax: [phone]     Social Determinants of Health (SDOH) Interventions    Readmission Risk Interventions Readmission Risk Prevention Plan 08/07/2019 02/15/2019 01/02/2019  Transportation Screening Complete - Complete  PCP or Specialist Appt within 3-5 Days - - Complete  Palliative Care Screening - - Not Applicable  Medication Review (RN Care Manager) Complete Complete -  [place] or Home Care Consult Complete - -  Palliative Care Screening Not Applicable - -  [place] Not Applicable - -  Some recent data might be hidden

## 2019-10-22 NOTE — Progress Notes (Signed)
[place] changed from 1 k to 2 k per MD order

## 2019-10-22 NOTE — Care Management Obs Status (Signed)
[place] NOTIFICATION   Patient Details  Name: Cynthia Dean MRN: [id number] Date of Birth: Feb 07, 1951   Medicare Observation Status Notification Given:  Yes    [name], RN 10/22/2019, 10:06 AM

## 2019-10-22 NOTE — Progress Notes (Signed)
BP noted to be trending down, BP cuff readjusted. Pt is alert and oriented. BP rechecked and now 111/58 P 70

## 2019-10-22 NOTE — Progress Notes (Signed)
[facility]  ROUNDING NOTE   Subjective:  Patient with significant hyperkalemia this a.m. Potassium 7.0. Undergoing dialysis now. Patient quite confused today as well.   Objective:  Vital signs in last 24 hours:  Temp:  [97.5 F (36.4 C)-98.8 F (37.1 C)] 98.8 F (37.1 C) (03/30 1150) Pulse Rate:  [64-71] 66 (03/30 1415) Resp:  [13-66] 13 (03/30 1415) BP: (125-194)/(53-86) 141/61 (03/30 1415) SpO2:  [98 %-100 %] 99 % (03/30 1300)  Weight change:  Filed Weights   10/21/19 0244  Weight: 136.1 kg    Intake/Output: I/O last 3 completed shifts: In: 360 [P.O.:360] Out: 250 [Urine:250]   Intake/Output this shift:  No intake/output data recorded.  Physical Exam: General: No acute distress  Head: Normocephalic, atraumatic. Moist oral mucosal membranes  Eyes: Anicteric  Neck: Supple, trachea midline  Lungs:  Clear to auscultation, normal effort  Heart: S1S2 no rubs  Abdomen:  Soft, nontender, bowel sounds present  Extremities: Trace peripheral edema.  Neurologic: Awake, but confused  Skin: No lesions  Access: LU AV fistula    Basic Metabolic Panel: Recent Labs  Lab 10/18/19 1303 10/21/19 0325 10/22/19 0636  NA 134* 137 134*  K 5.4* 4.7 7.0*  CL 92* 95* 92*  CO2 23 23 20*  GLUCOSE 188* 148* 157*  BUN 54* 82* 92*  CREATININE 10.92* 14.76* 16.39*  CALCIUM 9.4 9.9 9.1    Liver Function Tests: Recent Labs  Lab 10/18/19 1303 10/21/19 0325  AST 14* 11*  ALT 13 10  ALKPHOS 81 74  BILITOT 0.8 0.6  PROT 8.4* 8.2*  ALBUMIN 3.9 4.0   No results for input(s): LIPASE, AMYLASE in the last 168 hours. No results for input(s): AMMONIA in the last 168 hours.  CBC: Recent Labs  Lab 10/18/19 1303 10/21/19 0325 10/22/19 0636  WBC 11.4* 8.0 9.1  NEUTROABS 8.8* 5.6  --   HGB 9.9* 9.8* 10.1*  HCT 32.2* 33.7* 32.5*  MCV 89.4 94.7 89.0  PLT 268 197 235    Cardiac Enzymes: No results for input(s): CKTOTAL, CKMB, CKMBINDEX, TROPONINI in the last  168 hours.  BNP: Invalid input(s): POCBNP  CBG: Recent Labs  Lab 10/22/19 0229 10/22/19 0405 10/22/19 0601 10/22/19 0844 10/22/19 1107  GLUCAP 109* 144* 148* 151* 179*    Microbiology: Results for orders placed or performed during the hospital encounter of 10/21/19  Respiratory Panel by RT PCR (Flu A&B, Covid) - Nasopharyngeal Swab     Status: None   Collection Time: 10/21/19  6:24 AM   Specimen: Nasopharyngeal Swab  Result Value Ref Range Status   SARS Coronavirus 2 by RT PCR NEGATIVE NEGATIVE Final    Comment: (NOTE) SARS-CoV-2 target nucleic acids are NOT DETECTED. The SARS-CoV-2 RNA is generally detectable in upper respiratoy specimens during the acute phase of infection. The lowest concentration of SARS-CoV-2 viral copies this assay can detect is 131 copies/mL. A negative result does not preclude SARS-Cov-2 infection and should not be used as the sole basis for treatment or other patient management decisions. A negative result may occur with  improper specimen collection/handling, submission of specimen other than nasopharyngeal swab, presence of viral mutation(s) within the areas targeted by this assay, and inadequate number of viral copies (<131 copies/mL). A negative result must be combined with clinical observations, patient history, and epidemiological information. The expected result is Negative. Fact Sheet for Patients:  PinkCheek.be Fact Sheet for Healthcare Providers:  GravelBags.it This test is not yet ap proved or cleared by the [place]  States FDA and  has been authorized for detection and/or diagnosis of SARS-CoV-2 by FDA under an Emergency Use Authorization (EUA). This EUA will remain  in effect (meaning this test can be used) for the duration of the COVID-19 declaration under Section 564(b)(1) of the Act, 21 U.S.C. section 360bbb-3(b)(1), unless the authorization is terminated or revoked  sooner.    Influenza A by PCR NEGATIVE NEGATIVE Final   Influenza B by PCR NEGATIVE NEGATIVE Final    Comment: (NOTE) The Xpert Xpress SARS-CoV-2/FLU/RSV assay is intended as an aid in  the diagnosis of influenza from Nasopharyngeal swab specimens and  should not be used as a sole basis for treatment. Nasal washings and  aspirates are unacceptable for Xpert Xpress SARS-CoV-2/FLU/RSV  testing. Fact Sheet for Patients: PinkCheek.be Fact Sheet for Healthcare Providers: GravelBags.it This test is not yet approved or cleared by the [place] FDA and  has been authorized for detection and/or diagnosis of SARS-CoV-2 by  FDA under an Emergency Use Authorization (EUA). This EUA will remain  in effect (meaning this test can be used) for the duration of the  Covid-19 declaration under Section 564(b)(1) of the Act, 21  U.S.C. section 360bbb-3(b)(1), unless the authorization is  terminated or revoked. Performed at [facility], [address] [place], [place] [address]   Blood culture (routine x 2)     Status: None (Preliminary result)   Collection Time: 10/21/19  7:25 AM   Specimen: BLOOD  Result Value Ref Range Status   Specimen Description BLOOD RIGHT ARM  Final   Special Requests   Final    BOTTLES DRAWN AEROBIC AND ANAEROBIC Blood Culture adequate volume   Culture   Final    NO GROWTH < 24 HOURS Performed at [facility], [address], [place], [place] [address]    Report Status PENDING  Incomplete  Blood culture (routine x 2)     Status: None (Preliminary result)   Collection Time: 10/21/19  7:45 AM   Specimen: BLOOD  Result Value Ref Range Status   Specimen Description BLOOD RIGHT HAND  Final   Special Requests   Final    BOTTLES DRAWN AEROBIC AND ANAEROBIC Blood Culture adequate volume   Culture   Final    NO GROWTH < 24 HOURS Performed at [facility], [address],  [place], [place] [address]    Report Status PENDING  Incomplete    Coagulation Studies: No results for input(s): LABPROT, INR in the last 72 hours.  Urinalysis: Recent Labs    10/21/19 0249  COLORURINE YELLOW*  LABSPEC 1.012  PHURINE 7.0  GLUCOSEU NEGATIVE  HGBUR NEGATIVE  BILIRUBINUR NEGATIVE  KETONESUR NEGATIVE  PROTEINUR 100*  NITRITE NEGATIVE  LEUKOCYTESUR NEGATIVE      Imaging: DG Chest Portable 1 View  Result Date: 10/21/2019 CLINICAL DATA:  Cough EXAM: PORTABLE CHEST 1 VIEW COMPARISON:  Three days ago FINDINGS: Generalized interstitial coarsening. Normal heart size. An implantable loop recorder is present. Aortic atherosclerosis. There is no edema, consolidation, effusion, or pneumothorax. IMPRESSION: Interstitial coarsening that could be congestive or bronchitic. Electronically Signed   By: Monte Fantasia M.D.   On: 10/21/2019 06:41     Medications:    . aspirin EC  325 mg Oral Daily  . atorvastatin  80 mg Oral QPM  . calcium acetate  2,001 mg Oral TID WC  . cholecalciferol  1,000 Units Oral Daily  . clopidogrel  75 mg Oral Daily  . dextromethorphan-guaiFENesin  1  tablet Oral BID  . docusate sodium  100 mg Oral BID  . fluticasone  1 spray Each Nare Daily  . gabapentin  100 mg Oral QHS  . Ipratropium-Albuterol  1 puff Inhalation Q6H  . levETIRAcetam  1,000 mg Oral Daily  . losartan  100 mg Oral Daily  . mouth rinse  15 mL Mouth Rinse BID  . metoprolol succinate  100 mg Oral Daily  . pantoprazole  40 mg Oral Daily  . torsemide  100 mg Oral Daily   acetaminophen, albuterol, dextrose, hydrALAZINE, hydrocortisone, lidocaine-prilocaine, LORazepam, oxyCODONE-acetaminophen  Assessment/ Plan:  69 y.o. 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/LU access  1.  ESRD on HD MWF.  Patient in need of dialysis today given hyperkalemia.  Patient undergoing dialysis treatment today.  We plan to complete treatment today and reevaluate serum potassium tomorrow.  2.  Hyperkalemia.  Serum potassium quite high at 7.0.  Patient undergoing dialysis.  She was dialyzed against a 1K bath for period of time.  Recheck serum testing tomorrow.  3.  Anemia of chronic kidney disease.  Hemoglobin currently 10.1.  Hold off on Procrit for now.  4.  Secondary hyperparathyroidism.  Maintain the patient on calcium acetate 3 tablets p.o. 3 times daily with meals and monitor serum phosphorus over the course of hospitalization.   LOS: 0 Gini Caputo 3/30/20212:24 PM

## 2019-10-22 NOTE — Progress Notes (Addendum)
Patient complained of abdominal pain similar to pain experienced with recent cardiac even according to nurse and patient Patient initially pointed to right mid lower abdomen but when asked to clarify, pain was generalized across lower abdomen.  She denied radiation and described as continuous at 9/10 without any relief from percocet recently given. She denies any associated symptoms. Last BM yesterday and reported as normal. Neurologically patient seems generally slow.  Review of chart shows patient in with severe hypoglycemic and unresponsive event.  She had agonal respirations with a pulse and glucose of 21.  Troponins minimal elevation and down trended.   EKKG initial today RBBB and LPFB , SR.  Review of EKG on 3/26, block not present SR with prolonged QTc.  EKG ordered now for comparison,  SR with non specific ST abnormality, no evidence of RBBB or LPFB.  EKG today very similar to EKG on 3/26 CBG is stable 144 Troponin pending

## 2019-10-23 DIAGNOSIS — J449 Chronic obstructive pulmonary disease, unspecified: Secondary | ICD-10-CM

## 2019-10-23 DIAGNOSIS — N186 End stage renal disease: Secondary | ICD-10-CM

## 2019-10-23 DIAGNOSIS — E785 Hyperlipidemia, unspecified: Secondary | ICD-10-CM

## 2019-10-23 DIAGNOSIS — I10 Essential (primary) hypertension: Secondary | ICD-10-CM

## 2019-10-23 DIAGNOSIS — Z992 Dependence on renal dialysis: Secondary | ICD-10-CM

## 2019-10-23 DIAGNOSIS — E1122 Type 2 diabetes mellitus with diabetic chronic kidney disease: Secondary | ICD-10-CM

## 2019-10-23 DIAGNOSIS — K219 Gastro-esophageal reflux disease without esophagitis: Secondary | ICD-10-CM

## 2019-10-23 DIAGNOSIS — R4189 Other symptoms and signs involving cognitive functions and awareness: Secondary | ICD-10-CM

## 2019-10-23 DIAGNOSIS — I2510 Atherosclerotic heart disease of native coronary artery without angina pectoris: Secondary | ICD-10-CM

## 2019-10-23 DIAGNOSIS — E162 Hypoglycemia, unspecified: Secondary | ICD-10-CM

## 2019-10-23 LAB — BASIC METABOLIC PANEL
Anion gap: 17 — ABNORMAL HIGH (ref 5–15)
BUN: 48 mg/dL — ABNORMAL HIGH (ref 8–23)
CO2: 26 mmol/L (ref 22–32)
Calcium: 8.8 mg/dL — ABNORMAL LOW (ref 8.9–10.3)
Chloride: 92 mmol/L — ABNORMAL LOW (ref 98–111)
Creatinine, Ser: 10.73 mg/dL — ABNORMAL HIGH (ref 0.44–1.00)
GFR calc Af Amer: 4 mL/min — ABNORMAL LOW (ref >60)
GFR calc non Af Amer: 3 mL/min — ABNORMAL LOW (ref >60)
Glucose, Bld: 142 mg/dL — ABNORMAL HIGH (ref 70–99)
Potassium: 4.7 mmol/L (ref 3.5–5.1)
Sodium: 135 mmol/L (ref 135–145)

## 2019-10-23 LAB — GLUCOSE, CAPILLARY
Glucose-Capillary: 116 mg/dL — ABNORMAL HIGH (ref 70–99)
Glucose-Capillary: 123 mg/dL — ABNORMAL HIGH (ref 70–99)
Glucose-Capillary: 127 mg/dL — ABNORMAL HIGH (ref 70–99)
Glucose-Capillary: 149 mg/dL — ABNORMAL HIGH (ref 70–99)

## 2019-10-23 MED ORDER — LOSARTAN POTASSIUM 50 MG PO TABS
50.0000 mg | ORAL_TABLET | Freq: Every day | ORAL | Status: DC
  Administered 2019-10-24: 09:00:00 50 mg via ORAL
  Filled 2019-10-23: qty 1

## 2019-10-23 MED ORDER — HEPARIN SODIUM (PORCINE) 1000 UNIT/ML IJ SOLN
4000.0000 [IU] | Freq: Once | INTRAMUSCULAR | Status: AC
  Administered 2019-10-23: 4000 [IU] via INTRAVENOUS
  Filled 2019-10-23 (×2): qty 4

## 2019-10-23 MED ORDER — CEPHALEXIN 500 MG PO CAPS
500.0000 mg | ORAL_CAPSULE | Freq: Two times a day (BID) | ORAL | Status: DC
  Administered 2019-10-23 – 2019-10-24 (×4): 500 mg via ORAL
  Filled 2019-10-23 (×4): qty 1

## 2019-10-23 MED ORDER — METOPROLOL SUCCINATE ER 50 MG PO TB24
50.0000 mg | ORAL_TABLET | Freq: Every day | ORAL | Status: DC
  Administered 2019-10-24: 09:00:00 50 mg via ORAL
  Filled 2019-10-23: qty 1

## 2019-10-23 NOTE — Progress Notes (Signed)
[facility]  ROUNDING NOTE   Subjective:  Patient still quite confused. Apparently has had some periods of agitation. Seen and evaluated during dialysis treatment today and tolerating well. Potassium now down to 4.7.  Objective:  Vital signs in last 24 hours:  Temp:  [97.4 F (36.3 C)-98.6 F (37 C)] 97.4 F (36.3 C) (03/31 0840) Pulse Rate:  [64-74] 66 (03/31 1145) Resp:  [13-32] 18 (03/31 1145) BP: (82-193)/(27-76) 114/62 (03/31 1145) SpO2:  [93 %-99 %] 95 % (03/31 0840)  Weight change:  Filed Weights   10/21/19 0244  Weight: 136.1 kg    Intake/Output: I/O last 3 completed shifts: In: 0  Out: 2250 [Urine:250; Other:2000]   Intake/Output this shift:  Total I/O In: 120 [P.O.:120] Out: -   Physical Exam: General: No acute distress  Head: Normocephalic, atraumatic. Moist oral mucosal membranes  Eyes: Anicteric  Neck: Supple, trachea midline  Lungs:  Clear to auscultation, normal effort  Heart: S1S2 no rubs  Abdomen:  Soft, nontender, bowel sounds present  Extremities: Trace peripheral edema.  Neurologic: Awake, but confused  Skin: No lesions  Access: LU AV fistula    Basic Metabolic Panel: Recent Labs  Lab 10/18/19 1303 10/18/19 1303 10/21/19 0325 10/22/19 0636 10/23/19 0748  NA 134*  --  137 134* 135  K 5.4*  --  4.7 7.0* 4.7  CL 92*  --  95* 92* 92*  CO2 23  --  23 20* 26  GLUCOSE 188*  --  148* 157* 142*  BUN 54*  --  82* 92* 48*  CREATININE 10.92*  --  14.76* 16.39* 10.73*  CALCIUM 9.4   < > 9.9 9.1 8.8*   < > = values in this interval not displayed.    Liver Function Tests: Recent Labs  Lab 10/18/19 1303 10/21/19 0325  AST 14* 11*  ALT 13 10  ALKPHOS 81 74  BILITOT 0.8 0.6  PROT 8.4* 8.2*  ALBUMIN 3.9 4.0   No results for input(s): LIPASE, AMYLASE in the last 168 hours. No results for input(s): AMMONIA in the last 168 hours.  CBC: Recent Labs  Lab 10/18/19 1303 10/21/19 0325 10/22/19 0636  WBC 11.4* 8.0 9.1   NEUTROABS 8.8* 5.6  --   HGB 9.9* 9.8* 10.1*  HCT 32.2* 33.7* 32.5*  MCV 89.4 94.7 89.0  PLT 268 197 235    Cardiac Enzymes: No results for input(s): CKTOTAL, CKMB, CKMBINDEX, TROPONINI in the last 168 hours.  BNP: Invalid input(s): POCBNP  CBG: Recent Labs  Lab 10/22/19 0601 10/22/19 0844 10/22/19 1107 10/22/19 1644 10/23/19 0548  GLUCAP 148* 151* 179* 99 127*    Microbiology: Results for orders placed or performed during the hospital encounter of 10/21/19  Respiratory Panel by RT PCR (Flu A&B, Covid) - Nasopharyngeal Swab     Status: None   Collection Time: 10/21/19  6:24 AM   Specimen: Nasopharyngeal Swab  Result Value Ref Range Status   SARS Coronavirus 2 by RT PCR NEGATIVE NEGATIVE Final    Comment: (NOTE) SARS-CoV-2 target nucleic acids are NOT DETECTED. The SARS-CoV-2 RNA is generally detectable in upper respiratoy specimens during the acute phase of infection. The lowest concentration of SARS-CoV-2 viral copies this assay can detect is 131 copies/mL. A negative result does not preclude SARS-Cov-2 infection and should not be used as the sole basis for treatment or other patient management decisions. A negative result may occur with  improper specimen collection/handling, submission of specimen other than nasopharyngeal swab, presence of  viral mutation(s) within the areas targeted by this assay, and inadequate number of viral copies (<131 copies/mL). A negative result must be combined with clinical observations, patient history, and epidemiological information. The expected result is Negative. Fact Sheet for Patients:  PinkCheek.be Fact Sheet for Healthcare Providers:  GravelBags.it This test is not yet ap proved or cleared by the [place] FDA and  has been authorized for detection and/or diagnosis of SARS-CoV-2 by FDA under an Emergency Use Authorization (EUA). This EUA will remain  in effect  (meaning this test can be used) for the duration of the COVID-19 declaration under Section 564(b)(1) of the Act, 21 U.S.C. section 360bbb-3(b)(1), unless the authorization is terminated or revoked sooner.    Influenza A by PCR NEGATIVE NEGATIVE Final   Influenza B by PCR NEGATIVE NEGATIVE Final    Comment: (NOTE) The Xpert Xpress SARS-CoV-2/FLU/RSV assay is intended as an aid in  the diagnosis of influenza from Nasopharyngeal swab specimens and  should not be used as a sole basis for treatment. Nasal washings and  aspirates are unacceptable for Xpert Xpress SARS-CoV-2/FLU/RSV  testing. Fact Sheet for Patients: PinkCheek.be Fact Sheet for Healthcare Providers: GravelBags.it This test is not yet approved or cleared by the [place] FDA and  has been authorized for detection and/or diagnosis of SARS-CoV-2 by  FDA under an Emergency Use Authorization (EUA). This EUA will remain  in effect (meaning this test can be used) for the duration of the  Covid-19 declaration under Section 564(b)(1) of the Act, 21  U.S.C. section 360bbb-3(b)(1), unless the authorization is  terminated or revoked. Performed at [facility], [address] [place], [place] [address]   Blood culture (routine x 2)     Status: None (Preliminary result)   Collection Time: 10/21/19  7:25 AM   Specimen: BLOOD  Result Value Ref Range Status   Specimen Description BLOOD RIGHT ARM  Final   Special Requests   Final    BOTTLES DRAWN AEROBIC AND ANAEROBIC Blood Culture adequate volume   Culture   Final    NO GROWTH 2 DAYS Performed at [facility], [address], [place], [place] [address]    Report Status PENDING  Incomplete  Blood culture (routine x 2)     Status: None (Preliminary result)   Collection Time: 10/21/19  7:45 AM   Specimen: BLOOD  Result Value Ref Range Status   Specimen Description BLOOD RIGHT HAND  Final   Special  Requests   Final    BOTTLES DRAWN AEROBIC AND ANAEROBIC Blood Culture adequate volume   Culture   Final    NO GROWTH 2 DAYS Performed at [facility], [address] [place], [place] [address]    Report Status PENDING  Incomplete    Coagulation Studies: No results for input(s): LABPROT, INR in the last 72 hours.  Urinalysis: Recent Labs    10/21/19 0249  COLORURINE YELLOW*  LABSPEC 1.012  PHURINE 7.0  GLUCOSEU NEGATIVE  HGBUR NEGATIVE  BILIRUBINUR NEGATIVE  KETONESUR NEGATIVE  PROTEINUR 100*  NITRITE NEGATIVE  LEUKOCYTESUR NEGATIVE      Imaging: No results found.   Medications:    . aspirin EC  325 mg Oral Daily  . atorvastatin  80 mg Oral QPM  . calcium acetate  2,001 mg Oral TID WC  . cholecalciferol  1,000 Units Oral Daily  . clopidogrel  75 mg Oral Daily  . dextromethorphan-guaiFENesin  1 tablet Oral BID  . docusate sodium  100  mg Oral BID  . fluticasone  1 spray Each Nare Daily  . gabapentin  100 mg Oral QHS  . heparin  4,000 Units Intravenous Once  . Ipratropium-Albuterol  1 puff Inhalation Q6H  . levETIRAcetam  1,000 mg Oral Daily  . losartan  100 mg Oral Daily  . mouth rinse  15 mL Mouth Rinse BID  . metoprolol succinate  100 mg Oral Daily  . pantoprazole  40 mg Oral Daily  . torsemide  100 mg Oral Daily   acetaminophen, albuterol, dextrose, hydrALAZINE, hydrocortisone, lidocaine-prilocaine, LORazepam, oxyCODONE-acetaminophen  Assessment/ Plan:  69 y.o. 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/LU access  1.  ESRD on HD MWF.  Patient seen and evaluated during dialysis  treatment today.  Tolerating well.  We plan to complete dialysis treatment today.  2.  Hyperkalemia.  Potassium now down to 4.7.  Continue to monitor serum potassium.  Undergoing dialysis again today against a 2K bath..  3.  Anemia of chronic kidney disease.  Most recent hemoglobin was 10.1.  Hold off on Epogen for now.  4.  Secondary hyperparathyroidism.  Continue calcium acetate and monitor serum phosphorus periodically.   LOS: 1 Cynthia Dean 3/31/202112:28 PM

## 2019-10-23 NOTE — Progress Notes (Signed)
Patient is very confused and irritable, refusing meds and blood sugar checks. Due to confusion and impulsivity, I placed patient on low bed with floor mats.  Will continue to monitor.  Christene Slates  10/23/2019  3:41 AM

## 2019-10-23 NOTE — Progress Notes (Signed)
Patient refused to have blood sugar checked. Will attempt to try at 0600.  Christene Slates

## 2019-10-23 NOTE — Progress Notes (Signed)
Patient is also refusing to keep telemetry monitor and continuous pulse ox on. Central telemetry aware.  Christene Slates

## 2019-10-23 NOTE — Progress Notes (Signed)
PROGRESS NOTE  Cynthia Dean  GUY:[id number] DOB: [birth year] DOA: 10/21/2019 PCP: Lowella Bandy, MD   Brief Narrative: Cynthia Dean is a 69 y.o. female with a history of ESRD, T2DM, 4L O2-dependent COPD, CAD, carotid stenosis, seizure disorder, HTN, HLD, CVA, GERD, GI bleeding, and recent covid-19 infection who presented 3/29 with unresponsiveness and severe hypoglycemia in the setting of insulin administration with inadequate per oral intake. Fiance activated EMS. Police arrived at the scene and patient with agonal respirations and they started CPR. When EMS arrivedpatient had agonal respirations,but did have a pulse. They check her blood glucose which was 21. She received 250 cc of D10 and recheck blood glucose is 187 with improved responsiveness. She had been seen in the ED and prescribe keflex for UTI and received covid-19 2nd vaccination dose 4 days PTA and hadn't felt well since that time, missing HD x2. She was admitted, nephrology consulted for urgent HD due to hyperkalemia and has overseen dialysis. Mental status continues to be abnormal.   Assessment & Plan: Principal Problem:   Hypoglycemia Active Problems:   Essential hypertension   Hyperlipidemia   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)   CAD (coronary artery disease)   COPD (chronic obstructive pulmonary disease) (HCC)   ESRD on dialysis (HCC)   Stroke (HCC)   Seizure ([place])   Unresponsiveness   GERD (gastroesophageal reflux disease)  IDT2DM with severe hypoglycemia: - Holding 70/30 insulin, CBGs at goal. - D50 prn per protocol.   Acute encephalopathy: Uncertain etiology, though likely multifactorial with no focal deficits unlikely to be stroke (no acute infarcts on CT 3/26). Uncertain duration of hypoglycemia and possible loss of pulse at home portend poorer prognosis. Uremia also likely contributing. With pyuria and urinary complaints, UTI may be contributing.  -  Supportive care, optimize metabolic profile, avoid hypotension and ischemia, avoid hypoglycemia, treat UTI and monitor.  - With hx seizure disorder, continue AEDs, consider neurology consult and/or EEG if not improving.  - Delirium precautions. Regardless of current mental status, she's at very high risk of delirium.  - Decrease home gabapentin from 200mg  TID to maximum dose of 100mg  qHS.  UTI:  - Restart keflex 500mg  po BID x7 days planned.  ESRD with hyperkalemia which has improved:  - Continue hemodialysis per nephrology.  - [place]  HTN:  - - Decrease antihypertensives to leave room for HD. Patient likely susceptible to cerebral hypoperfusion.  HLD:  - Continue statin  CAD, HFpEF:  - Continue DAPT, statin, beta blocker. No ACS evident at this time.   COPD:  - Continue supplemental oxygen as needed - Continue bronchodilators.   History of CVA: CT showed atrophy with periventricular small vessel disease. Several prior infarcts noted which appear stable. No acute infarct evident. No mass or hemorrhage. - Continue DAPT, statin, risk factor control.   GERD:  - Continue PPI  Obesity: Body mass index is 44.3 kg/m.   DVT prophylaxis: SCDs Code Status: Full Family Communication: None at bedside this AM Disposition Plan: Patient from home. Mental status remains abnormal, will get PT/OT consultants to inform disposition, though this will primarily depend on mental status progress.   Consultants:   Nephrology  Procedures:   Dialysis   Antimicrobials:  Keflex   Subjective: Confused, denies pain, BP low during HD today.   Objective: Vitals:   10/23/19 1130 10/23/19 1145 10/23/19 1202 10/23/19 1237  BP: 111/63 114/62 136/74 117/64  Pulse: 67 66 68 66  Resp: 18  18 18 18   Temp:  (!) 97.5 F (36.4 C)  97.7 F (36.5 C)  TempSrc:  Oral  Oral  SpO2:    94%  Weight:      Height:        Intake/Output Summary (Last 24 hours) at 10/23/2019 1826 Last data filed at  10/23/2019 1413 Gross per 24 hour  Intake 240 ml  Output 741 ml  Net -501 ml   Filed Weights   10/21/19 0244  Weight: 136.1 kg    Gen: 69 y.o. female in no distress Pulm: Non-labored breathing. Clear to auscultation bilaterally.  CV: Regular rate and rhythm. No murmur, rub, or gallop. No JVD, no pitting pedal edema. GI: Abdomen soft, non-tender, non-distended, with normoactive bowel sounds. No organomegaly or masses felt. Ext: Warm, no deformities Skin: No rashes, lesions or ulcers Neuro: Drowsy, poorly cooperative with exam, in and out attention.  Psych: Judgement and insight appear impaired.   Data Reviewed: I have personally reviewed following labs and imaging studies  CBC: Recent Labs  Lab 10/18/19 1303 10/21/19 0325 10/22/19 0636  WBC 11.4* 8.0 9.1  NEUTROABS 8.8* 5.6  --   HGB 9.9* 9.8* 10.1*  HCT 32.2* 33.7* 32.5*  MCV 89.4 94.7 89.0  PLT 268 197 258   Basic Metabolic Panel: Recent Labs  Lab 10/18/19 1303 10/21/19 0325 10/22/19 0636 10/23/19 0748  NA 134* 137 134* 135  K 5.4* 4.7 7.0* 4.7  CL 92* 95* 92* 92*  CO2 23 23 20* 26  GLUCOSE 188* 148* 157* 142*  BUN 54* 82* 92* 48*  CREATININE 10.92* 14.76* 16.39* 10.73*  CALCIUM 9.4 9.9 9.1 8.8*   GFR: Estimated Creatinine Clearance: 7.5 mL/min (A) (by C-G formula based on SCr of 10.73 mg/dL (H)). Liver Function Tests: Recent Labs  Lab 10/18/19 1303 10/21/19 0325  AST 14* 11*  ALT 13 10  ALKPHOS 81 74  BILITOT 0.8 0.6  PROT 8.4* 8.2*  ALBUMIN 3.9 4.0   No results for input(s): LIPASE, AMYLASE in the last 168 hours. No results for input(s): AMMONIA in the last 168 hours. Coagulation Profile: No results for input(s): INR, PROTIME in the last 168 hours. Cardiac Enzymes: No results for input(s): CKTOTAL, CKMB, CKMBINDEX, TROPONINI in the last 168 hours. BNP (last 3 results) No results for input(s): PROBNP in the last 8760 hours. HbA1C: No results for input(s): HGBA1C in the last 72  hours. CBG: Recent Labs  Lab 10/22/19 1107 10/22/19 1644 10/23/19 0548 10/23/19 1236 10/23/19 1808  GLUCAP 179* 99 127* 123* 149*   Lipid Profile: No results for input(s): CHOL, HDL, LDLCALC, TRIG, CHOLHDL, LDLDIRECT in the last 72 hours. Thyroid Function Tests: No results for input(s): TSH, T4TOTAL, FREET4, T3FREE, THYROIDAB in the last 72 hours. Anemia Panel: No results for input(s): VITAMINB12, FOLATE, FERRITIN, TIBC, IRON, RETICCTPCT in the last 72 hours. Urine analysis:    Component Value Date/Time   COLORURINE YELLOW (A) 10/21/2019 0249   APPEARANCEUR HAZY (A) 10/21/2019 0249   APPEARANCEUR Clear 06/14/2014 1510   LABSPEC 1.012 10/21/2019 0249   LABSPEC 1.006 06/14/2014 1510   PHURINE 7.0 10/21/2019 0249   GLUCOSEU NEGATIVE 10/21/2019 0249   GLUCOSEU >=500 06/14/2014 1510   HGBUR NEGATIVE 10/21/2019 0249   BILIRUBINUR NEGATIVE 10/21/2019 0249   BILIRUBINUR Negative 06/14/2014 [place] 10/21/2019 0249   PROTEINUR 100 (A) 10/21/2019 0249   NITRITE NEGATIVE 10/21/2019 0249   LEUKOCYTESUR NEGATIVE 10/21/2019 0249   LEUKOCYTESUR Negative 06/14/2014 1510   Recent Results (  from the past 240 hour(s))  Urine culture     Status: Abnormal   Collection Time: 10/18/19 10:44 PM   Specimen: Urine, Random  Result Value Ref Range Status   Specimen Description   Final    URINE, RANDOM Performed at [facility], [address], [place], [place] [address]    Special Requests   Final    NONE Performed at [facility], [address] [place], [place] [address]    Culture MULTIPLE SPECIES PRESENT, SUGGEST RECOLLECTION (A)  Final   Report Status 10/20/2019 FINAL  Final  Respiratory Panel by RT PCR (Flu A&B, Covid) - Nasopharyngeal Swab     Status: None   Collection Time: 10/21/19  6:24 AM   Specimen: Nasopharyngeal Swab  Result Value Ref Range Status   SARS Coronavirus 2 by RT PCR NEGATIVE NEGATIVE Final    Comment: (NOTE) SARS-CoV-2  target nucleic acids are NOT DETECTED. The SARS-CoV-2 RNA is generally detectable in upper respiratoy specimens during the acute phase of infection. The lowest concentration of SARS-CoV-2 viral copies this assay can detect is 131 copies/mL. A negative result does not preclude SARS-Cov-2 infection and should not be used as the sole basis for treatment or other patient management decisions. A negative result may occur with  improper specimen collection/handling, submission of specimen other than nasopharyngeal swab, presence of viral mutation(s) within the areas targeted by this assay, and inadequate number of viral copies (<131 copies/mL). A negative result must be combined with clinical observations, patient history, and epidemiological information. The expected result is Negative. Fact Sheet for Patients:  PinkCheek.be Fact Sheet for Healthcare Providers:  GravelBags.it This test is not yet ap proved or cleared by the [place] FDA and  has been authorized for detection and/or diagnosis of SARS-CoV-2 by FDA under an Emergency Use Authorization (EUA). This EUA will remain  in effect (meaning this test can be used) for the duration of the COVID-19 declaration under Section 564(b)(1) of the Act, 21 U.S.C. section 360bbb-3(b)(1), unless the authorization is terminated or revoked sooner.    Influenza A by PCR NEGATIVE NEGATIVE Final   Influenza B by PCR NEGATIVE NEGATIVE Final    Comment: (NOTE) The Xpert Xpress SARS-CoV-2/FLU/RSV assay is intended as an aid in  the diagnosis of influenza from Nasopharyngeal swab specimens and  should not be used as a sole basis for treatment. Nasal washings and  aspirates are unacceptable for Xpert Xpress SARS-CoV-2/FLU/RSV  testing. Fact Sheet for Patients: PinkCheek.be Fact Sheet for Healthcare Providers: GravelBags.it This test is  not yet approved or cleared by the [place] FDA and  has been authorized for detection and/or diagnosis of SARS-CoV-2 by  FDA under an Emergency Use Authorization (EUA). This EUA will remain  in effect (meaning this test can be used) for the duration of the  Covid-19 declaration under Section 564(b)(1) of the Act, 21  U.S.C. section 360bbb-3(b)(1), unless the authorization is  terminated or revoked. Performed at [facility], [address] [place], [place] [address]   Blood culture (routine x 2)     Status: None (Preliminary result)   Collection Time: 10/21/19  7:25 AM   Specimen: BLOOD  Result Value Ref Range Status   Specimen Description BLOOD RIGHT ARM  Final   Special Requests   Final    BOTTLES DRAWN AEROBIC AND ANAEROBIC Blood Culture adequate volume   Culture   Final    NO GROWTH 2 DAYS Performed at [facility], [address] [place], [place] [address]    Report Status PENDING  Incomplete  Blood culture (routine x 2)     Status: None (Preliminary result)   Collection Time: 10/21/19  7:45 AM   Specimen: BLOOD  Result Value Ref Range Status   Specimen Description BLOOD RIGHT HAND  Final   Special Requests   Final    BOTTLES DRAWN AEROBIC AND ANAEROBIC Blood Culture adequate volume   Culture   Final    NO GROWTH 2 DAYS Performed at [facility], [address] [place], [place] [address]    Report Status PENDING  Incomplete      Radiology Studies: No results found.  Scheduled Meds: . aspirin EC  325 mg Oral Daily  . atorvastatin  80 mg Oral QPM  . calcium acetate  2,001 mg Oral TID WC  . cephALEXin  500 mg Oral BID  . cholecalciferol  1,000 Units Oral Daily  . clopidogrel  75 mg Oral Daily  . dextromethorphan-guaiFENesin  1 tablet Oral BID  . docusate sodium  100 mg Oral BID  . fluticasone  1 spray Each Nare Daily  . gabapentin  100 mg Oral QHS  . Ipratropium-Albuterol  1 puff Inhalation Q6H  . levETIRAcetam  1,000 mg Oral  Daily  . losartan  100 mg Oral Daily  . mouth rinse  15 mL Mouth Rinse BID  . metoprolol succinate  100 mg Oral Daily  . pantoprazole  40 mg Oral Daily  . torsemide  100 mg Oral Daily   Continuous Infusions:   LOS: 1 day   Time spent: 25 minutes.  Patrecia Pour, MD [facility] [email or web address] 10/23/2019, 6:26 PM

## 2019-10-24 ENCOUNTER — Inpatient Hospital Stay: Payer: Medicare HMO

## 2019-10-24 DIAGNOSIS — R569 Unspecified convulsions: Secondary | ICD-10-CM

## 2019-10-24 LAB — CBC
HCT: 29 % — ABNORMAL LOW (ref 36.0–46.0)
Hemoglobin: 9.1 g/dL — ABNORMAL LOW (ref 12.0–15.0)
MCH: 27.6 pg (ref 26.0–34.0)
MCHC: 31.4 g/dL (ref 30.0–36.0)
MCV: 87.9 fL (ref 80.0–100.0)
Platelets: 207 10*3/uL (ref 150–400)
RBC: 3.3 MIL/uL — ABNORMAL LOW (ref 3.87–5.11)
RDW: 15.8 % — ABNORMAL HIGH (ref 11.5–15.5)
WBC: 7.4 10*3/uL (ref 4.0–10.5)
nRBC: 0 % (ref 0.0–0.2)

## 2019-10-24 LAB — BASIC METABOLIC PANEL
Anion gap: 13 (ref 5–15)
BUN: 38 mg/dL — ABNORMAL HIGH (ref 8–23)
CO2: 31 mmol/L (ref 22–32)
Calcium: 9.5 mg/dL (ref 8.9–10.3)
Chloride: 93 mmol/L — ABNORMAL LOW (ref 98–111)
Creatinine, Ser: 8.8 mg/dL — ABNORMAL HIGH (ref 0.44–1.00)
GFR calc Af Amer: 5 mL/min — ABNORMAL LOW (ref >60)
GFR calc non Af Amer: 4 mL/min — ABNORMAL LOW (ref >60)
Glucose, Bld: 154 mg/dL — ABNORMAL HIGH (ref 70–99)
Potassium: 4.4 mmol/L (ref 3.5–5.1)
Sodium: 137 mmol/L (ref 135–145)

## 2019-10-24 LAB — GLUCOSE, CAPILLARY
Glucose-Capillary: 146 mg/dL — ABNORMAL HIGH (ref 70–99)
Glucose-Capillary: 197 mg/dL — ABNORMAL HIGH (ref 70–99)
Glucose-Capillary: 140 mg/dL — ABNORMAL HIGH (ref 70–99)

## 2019-10-24 NOTE — Progress Notes (Signed)
[facility]  ROUNDING NOTE   Subjective:  Patient more awake and alert today. Also a bit more conversant. Had dialysis yesterday.  Objective:  Vital signs in last 24 hours:  Temp:  [98 F (36.7 C)-98.6 F (37 C)] 98.6 F (37 C) (04/01 1202) Pulse Rate:  [71-74] 72 (04/01 1202) Resp:  [16-20] 20 (04/01 1202) BP: (111-154)/(61-70) 111/65 (04/01 1202) SpO2:  [93 %-97 %] 93 % (04/01 1202)  Weight change:  Filed Weights   10/21/19 0244  Weight: 136.1 kg    Intake/Output: I/O last 3 completed shifts: In: 240 [P.O.:240] Out: 741 [Other:741]   Intake/Output this shift:  No intake/output data recorded.  Physical Exam: General: No acute distress  Head: Normocephalic, atraumatic. Moist oral mucosal membranes  Eyes: Anicteric  Neck: Supple, trachea midline  Lungs:  Clear to auscultation, normal effort  Heart: S1S2 no rubs  Abdomen:  Soft, nontender, bowel sounds present  Extremities: Trace peripheral edema.  Neurologic: Awake, but confused  Skin: No lesions  Access: LU AV fistula    Basic Metabolic Panel: Recent Labs  Lab 10/18/19 1303 10/18/19 1303 10/21/19 0325 10/21/19 0325 10/22/19 0636 10/23/19 0748 10/24/19 0434  NA 134*  --  137  --  134* 135 137  K 5.4*  --  4.7  --  7.0* 4.7 4.4  CL 92*  --  95*  --  92* 92* 93*  CO2 23  --  23  --  20* 26 31  GLUCOSE 188*  --  148*  --  157* 142* 154*  BUN 54*  --  82*  --  92* 48* 38*  CREATININE 10.92*  --  14.76*  --  16.39* 10.73* 8.80*  CALCIUM 9.4   < > 9.9   < > 9.1 8.8* 9.5   < > = values in this interval not displayed.    Liver Function Tests: Recent Labs  Lab 10/18/19 1303 10/21/19 0325  AST 14* 11*  ALT 13 10  ALKPHOS 81 74  BILITOT 0.8 0.6  PROT 8.4* 8.2*  ALBUMIN 3.9 4.0   No results for input(s): LIPASE, AMYLASE in the last 168 hours. No results for input(s): AMMONIA in the last 168 hours.  CBC: Recent Labs  Lab 10/18/19 1303 10/21/19 0325 10/22/19 0636 10/24/19 0434   WBC 11.4* 8.0 9.1 7.4  NEUTROABS 8.8* 5.6  --   --   HGB 9.9* 9.8* 10.1* 9.1*  HCT 32.2* 33.7* 32.5* 29.0*  MCV 89.4 94.7 89.0 87.9  PLT 268 197 235 207    Cardiac Enzymes: No results for input(s): CKTOTAL, CKMB, CKMBINDEX, TROPONINI in the last 168 hours.  BNP: Invalid input(s): POCBNP  CBG: Recent Labs  Lab 10/23/19 1236 10/23/19 1808 10/23/19 2331 10/24/19 0449 10/24/19 1158  GLUCAP 123* 149* 116* 146* 197*    Microbiology: Results for orders placed or performed during the hospital encounter of 10/21/19  Respiratory Panel by RT PCR (Flu A&B, Covid) - Nasopharyngeal Swab     Status: None   Collection Time: 10/21/19  6:24 AM   Specimen: Nasopharyngeal Swab  Result Value Ref Range Status   SARS Coronavirus 2 by RT PCR NEGATIVE NEGATIVE Final    Comment: (NOTE) SARS-CoV-2 target nucleic acids are NOT DETECTED. The SARS-CoV-2 RNA is generally detectable in upper respiratoy specimens during the acute phase of infection. The lowest concentration of SARS-CoV-2 viral copies this assay can detect is 131 copies/mL. A negative result does not preclude SARS-Cov-2 infection and should not be used  as the sole basis for treatment or other patient management decisions. A negative result may occur with  improper specimen collection/handling, submission of specimen other than nasopharyngeal swab, presence of viral mutation(s) within the areas targeted by this assay, and inadequate number of viral copies (<131 copies/mL). A negative result must be combined with clinical observations, patient history, and epidemiological information. The expected result is Negative. Fact Sheet for Patients:  PinkCheek.be Fact Sheet for Healthcare Providers:  GravelBags.it This test is not yet ap proved or cleared by the [place] FDA and  has been authorized for detection and/or diagnosis of SARS-CoV-2 by FDA under an Emergency Use  Authorization (EUA). This EUA will remain  in effect (meaning this test can be used) for the duration of the COVID-19 declaration under Section 564(b)(1) of the Act, 21 U.S.C. section 360bbb-3(b)(1), unless the authorization is terminated or revoked sooner.    Influenza A by PCR NEGATIVE NEGATIVE Final   Influenza B by PCR NEGATIVE NEGATIVE Final    Comment: (NOTE) The Xpert Xpress SARS-CoV-2/FLU/RSV assay is intended as an aid in  the diagnosis of influenza from Nasopharyngeal swab specimens and  should not be used as a sole basis for treatment. Nasal washings and  aspirates are unacceptable for Xpert Xpress SARS-CoV-2/FLU/RSV  testing. Fact Sheet for Patients: PinkCheek.be Fact Sheet for Healthcare Providers: GravelBags.it This test is not yet approved or cleared by the [place] FDA and  has been authorized for detection and/or diagnosis of SARS-CoV-2 by  FDA under an Emergency Use Authorization (EUA). This EUA will remain  in effect (meaning this test can be used) for the duration of the  Covid-19 declaration under Section 564(b)(1) of the Act, 21  U.S.C. section 360bbb-3(b)(1), unless the authorization is  terminated or revoked. Performed at [facility], [address] [place], [place] [address]   Blood culture (routine x 2)     Status: None (Preliminary result)   Collection Time: 10/21/19  7:25 AM   Specimen: BLOOD  Result Value Ref Range Status   Specimen Description BLOOD RIGHT ARM  Final   Special Requests   Final    BOTTLES DRAWN AEROBIC AND ANAEROBIC Blood Culture adequate volume   Culture   Final    NO GROWTH 3 DAYS Performed at [facility], [address] [place], [place] [address]    Report Status PENDING  Incomplete  Blood culture (routine x 2)     Status: None (Preliminary result)   Collection Time: 10/21/19  7:45 AM   Specimen: BLOOD  Result Value Ref Range Status    Specimen Description BLOOD RIGHT HAND  Final   Special Requests   Final    BOTTLES DRAWN AEROBIC AND ANAEROBIC Blood Culture adequate volume   Culture   Final    NO GROWTH 3 DAYS Performed at [facility], [address] [place], [place] [address]    Report Status PENDING  Incomplete    Coagulation Studies: No results for input(s): LABPROT, INR in the last 72 hours.  Urinalysis: No results for input(s): COLORURINE, LABSPEC, PHURINE, GLUCOSEU, HGBUR, BILIRUBINUR, KETONESUR, PROTEINUR, UROBILINOGEN, NITRITE, LEUKOCYTESUR in the last 72 hours.  Invalid input(s): APPERANCEUR    Imaging: No results found.   Medications:    . aspirin EC  325 mg Oral Daily  . atorvastatin  80 mg Oral QPM  . calcium acetate  2,001 mg Oral TID WC  . cephALEXin  500 mg Oral BID  . cholecalciferol  1,000 Units Oral  Daily  . clopidogrel  75 mg Oral Daily  . dextromethorphan-guaiFENesin  1 tablet Oral BID  . docusate sodium  100 mg Oral BID  . fluticasone  1 spray Each Nare Daily  . gabapentin  100 mg Oral QHS  . Ipratropium-Albuterol  1 puff Inhalation Q6H  . levETIRAcetam  1,000 mg Oral Daily  . losartan  50 mg Oral Daily  . mouth rinse  15 mL Mouth Rinse BID  . metoprolol succinate  50 mg Oral Daily  . pantoprazole  40 mg Oral Daily  . torsemide  100 mg Oral Daily   acetaminophen, albuterol, dextrose, hydrALAZINE, hydrocortisone, lidocaine-prilocaine, LORazepam, oxyCODONE-acetaminophen  Assessment/ Plan:  69 y.o. 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/LU access  1.  ESRD on HD MWF.  Patient had dialysis  treatment performed yesterday.  Tolerated well.  No acute indication for dialysis today.  We will plan for dialysis again tomorrow if still here.  2.  Hyperkalemia.  Potassium normalized after dialysis and most recent serum potassium was 4.4..  3.  Anemia of chronic kidney disease.  Consider restarting Epogen tomorrow as most recent hemoglobin was 9.1.  4.  Secondary hyperparathyroidism.  Recheck serum phosphorus tomorrow and otherwise maintain the patient on calcium acetate 3 tablets p.o. 3 times daily with meals.   LOS: 2 Cynthia Dean 4/1/20213:44 PM

## 2019-10-24 NOTE — Evaluation (Signed)
Physical Therapy Evaluation Patient Details Name: Cynthia Dean MRN: [id number] DOB: 1951-05-16 Today's Date: 10/24/2019   History of Present Illness  69 y.o. female with a history of ESRD, T2DM, 4L O2-dependent COPD, CAD, carotid stenosis, seizure disorder, HTN, HLD, CVA, GERD, recieves HD GI bleeding, and recent covid-19 infection who presented 3/29 with unresponsiveness and severe hypoglycemia in the setting of insulin administration with inadequate per oral intake. Fiance activated EMS. Police arrived at the scene and patient with agonal respirations and they started CPR. Most recent blood sugar 146.    Clinical Impression  Pt alert, oriented to self only, perseveration noted. Denied pain, unable to provide clear PLOF, gathered from previous hospital stay about 2 months ago. Per chart pt has a fiance, and is mostly independent/modI at home. Pt stated that she uses a cane, but also reported she had a walker, RW utilized for mobility.  The patient was able to follow visual cues and one step commands well, easily re-directable. Supine to sit with supervision and maintained good sitting balance while RN administered medication. Sit <> stand with RW and CGA, incorrect hand placement noted and pt educated to improve safety. She ambulated ~8ft with RW and CGA. Cues to avoid obstacles, no physical assistance needed. Pt up in chair, OT at bedside at end of session.  Overall the patient demonstrated mild deficits (see "PT Problem List") that impede the patient's functional abilities, safety, and mobility and would benefit from skilled PT intervention. Recommendation is HHPT with 24/7 supervision/assistance.      Follow Up Recommendations Home health PT;Supervision/Assistance - 24 hour    Equipment Recommendations  Rolling walker with 5" wheels    Recommendations for Other Services       Precautions / Restrictions Precautions Precautions: Fall Restrictions Weight Bearing Restrictions: No       Mobility  Bed Mobility Overal bed mobility: Needs Assistance Bed Mobility: Supine to Sit     Supine to sit: Supervision;HOB elevated        Transfers Overall transfer level: Needs assistance Equipment used: Rolling walker (2 wheeled) Transfers: Sit to/from Stand Sit to Stand: Min guard            Ambulation/Gait   Gait Distance (Feet): 25 Feet Assistive device: Rolling walker (2 wheeled)       General Gait Details: Pt needed verbal and visual cueing to attend to task, and to avoid obstacles, no physical assistance needed.  Stairs            Wheelchair Mobility    Modified Rankin (Stroke Patients Only)       Balance Overall balance assessment: Needs assistance Sitting-balance support: Feet supported Sitting balance-Leahy Scale: Good       Standing balance-Leahy Scale: Fair                               Pertinent Vitals/Pain Pain Assessment: No/denies pain    Home Living Family/patient expects to be discharged to:: Private residence Living Arrangements: Children Available Help at Discharge: Family;Available PRN/intermittently Type of Home: House Home Access: Stairs to enter Entrance Stairs-Rails: Right;Left;Can reach both Entrance Stairs-Number of Steps: 2 Home Layout: One level   Additional Comments: PLOF gathered from chart review from hospital stay 2 months ago. Pt reported having a RW and SPC, unclear if this is accurate    Prior Function           Comments: pt questionable historian  Hand Dominance        Extremity/Trunk Assessment   Upper Extremity Assessment Upper Extremity Assessment: Overall WFL for tasks assessed(grossly 4/5)    Lower Extremity Assessment Lower Extremity Assessment: (grossly 4-/5)    Cervical / Trunk Assessment Cervical / Trunk Assessment: Normal  Communication   Communication: Other (comment)(perseveration noted)  Cognition Arousal/Alertness: Awake/alert Behavior During  Therapy: WFL for tasks assessed/performed Overall Cognitive Status: No family/caregiver present to determine baseline cognitive functioning                                 General Comments: Pt oriented to name and birthday, disoriented to time place situation. verbal perseveration noted      General Comments      Exercises Other Exercises Other Exercises: Pt able to sit EOB with good sitting balance for several minutes during RN administration of medication   Assessment/Plan    PT Assessment Patient needs continued PT services  PT Problem List Decreased strength;Decreased activity tolerance;Decreased balance;Decreased mobility       PT Treatment Interventions DME instruction;Therapeutic activities;Gait training;Therapeutic exercise;Patient/family education;Stair training;Balance training;Functional mobility training;Neuromuscular re-education    PT Goals (Current goals can be found in the Care Plan section)  Acute Rehab PT Goals PT Goal Formulation: Patient unable to participate in goal setting Time For Goal Achievement: 11/07/19 Potential to Achieve Goals: Good    Frequency Min 2X/week   Barriers to discharge        Co-evaluation               AM-PAC PT "6 Clicks" Mobility  Outcome Measure Help needed turning from your back to your side while in a flat bed without using bedrails?: None Help needed moving from lying on your back to sitting on the side of a flat bed without using bedrails?: None Help needed moving to and from a bed to a chair (including a wheelchair)?: None Help needed standing up from a chair using your arms (e.g., wheelchair or bedside chair)?: None Help needed to walk in hospital room?: None Help needed climbing 3-5 steps with a railing? : A Little 6 Click Score: 23    End of Session Equipment Utilized During Treatment: Gait belt Activity Tolerance: Patient tolerated treatment well Patient left: in chair;with chair alarm  set;with call bell/phone within reach Nurse Communication: Mobility status PT Visit Diagnosis: Other abnormalities of gait and mobility (R26.89);Muscle weakness (generalized) (M62.81)    Time: 3664-4034 PT Time Calculation (min) (ACUTE ONLY): 22 min   Charges:     PT Treatments $Therapeutic Exercise: 8-22 mins       Lieutenant Diego PT, DPT 9:28 AM,10/24/19

## 2019-10-24 NOTE — Consult Note (Addendum)
Reason for Consult:AMS Requesting Physician: Bonner Puna  CC: Hypoglycemia  I have been asked by Dr. Bonner Puna to see this patient in consultation for AMS.  HPI: Cynthia Dean is an 69 y.o. female with medical history significant of ESRD-HD (MWF), hypertension, hyperlipidemia, COPD on 4 L oxygen, stroke, GERD, COVID-19 infection (07/30/2019), GI bleeding, CAD, carotid artery stenosis, seizure, who presented on 3/29 with unresponsiveness and hypoglycemia.  EMS documented blood sugar at 21.  Patient treated with D10 with good response.  Unclear how long patient was unresponsive.  Patient had also missed some of her HD.  Yesterday was lethargic.  Today more alert but altered.   Patient with a history of multiple episodes of delirium with illness in the past.  Last seen in January with what was felt to be an encephalopathic state related to COVID infection.    Past Medical History:  Diagnosis Date  . Acute on chronic respiratory failure with hypoxia ([place]) 07/30/2019  . Acute respiratory failure with hypoxia ([place]) 12/31/2018  . Carotid arterial disease ([place])    a. 02/2018 < bilat ICA stenosis.  [name] COPD (chronic obstructive pulmonary disease) ([place])   . Coronary artery disease    a. 11/2015 MV: EF 57%, no ischemia/infarct.  . Cryptogenic stroke ([place])    a. 10/2017 s/p implantable loop recorder (no Afib to date).  . Diabetes mellitus without complication ([place])   . Diarrhea   . ESRD on dialysis ([place])   . GI bleed    a. 01/2019 EGD/Colonoscopy: Mild gastritis. Colitis.  [name] Heart murmur    a. 12/2018 Echo: EF 55-60%, impaired relaxation. Mod dil LA. Mildly dil RA. Mild Ca2+ of AoV.  [name] Hematemesis 06/27/2017  . History of 2019 novel coronavirus disease (COVID-19) 07/30/2019  . Hyperkalemia   . Hyperlipidemia   . Hypertension   . Intractable nausea and vomiting 08/08/2015  . Nausea & vomiting 05/30/2018  . Nausea and vomiting 05/29/2018  . Pneumonia due to COVID-19 virus     Past Surgical History:   Procedure Laterality Date  . A/V FISTULAGRAM Left 12/20/2016   Procedure: A/V Fistulagram;  Surgeon: Katha Cabal, MD;  Location: [place] [facility];  Service: Cardiovascular;  Laterality: Left;  . A/V SHUNT INTERVENTION N/A 12/20/2016   Procedure: A/V Shunt Intervention;  Surgeon: Katha Cabal, MD;  Location: [place] [facility];  Service: Cardiovascular;  Laterality: N/A;  . A/V SHUNTOGRAM Left 09/11/2017   Procedure: A/V SHUNTOGRAM;  Surgeon: Algernon Huxley, MD;  Location: [place] [facility];  Service: Cardiovascular;  Laterality: Left;  . BREAST BIOPSY Bilateral 07/19/00   neg  . COLONOSCOPY WITH PROPOFOL N/A 02/16/2019   Procedure: COLONOSCOPY WITH PROPOFOL;  Surgeon: Toledo, Benay Pike, MD;  Location: [facility];  Service: Gastroenterology;  Laterality: N/A;  . ESOPHAGOGASTRODUODENOSCOPY (EGD) WITH PROPOFOL N/A 02/16/2019   Procedure: ESOPHAGOGASTRODUODENOSCOPY (EGD) WITH PROPOFOL;  Surgeon: Toledo, Benay Pike, MD;  Location: [facility];  Service: Gastroenterology;  Laterality: N/A;  . LOOP RECORDER INSERTION N/A 11/16/2017   Procedure: LOOP RECORDER INSERTION;  Surgeon: Deboraha Sprang, MD;  Location: [place] [facility];  Service: Cardiovascular;  Laterality: N/A;  . PERIPHERAL VASCULAR CATHETERIZATION Left 02/02/2015   Procedure: A/V Shuntogram/Fistulagram;  Surgeon: Algernon Huxley, MD;  Location: [place] [facility];  Service: Cardiovascular;  Laterality: Left;  . PERIPHERAL VASCULAR CATHETERIZATION Left 02/02/2015   Procedure: A/V Shunt Intervention;  Surgeon: Algernon Huxley, MD;  Location: [place] [facility];  Service: Cardiovascular;  Laterality: Left;  .  PERIPHERAL VASCULAR CATHETERIZATION Left 03/09/2015   Procedure: A/V Shuntogram/Fistulagram;  Surgeon: Algernon Huxley, MD;  Location: [place] [facility];  Service: Cardiovascular;  Laterality: Left;  . PERIPHERAL VASCULAR CATHETERIZATION N/A 03/09/2015   Procedure: A/V Shunt Intervention;  Surgeon: Algernon Huxley, MD;   Location: [place] [facility];  Service: Cardiovascular;  Laterality: N/A;  . PERIPHERAL VASCULAR CATHETERIZATION Left 04/17/2015   Procedure: Upper Extremity Angiography;  Surgeon: Algernon Huxley, MD;  Location: [place] [facility];  Service: Cardiovascular;  Laterality: Left;  . PERIPHERAL VASCULAR CATHETERIZATION  04/17/2015   Procedure: Upper Extremity Intervention;  Surgeon: Algernon Huxley, MD;  Location: [place] [facility];  Service: Cardiovascular;;  . PERIPHERAL VASCULAR CATHETERIZATION N/A 08/10/2015   Procedure: A/V Shuntogram/Fistulagram;  Surgeon: Algernon Huxley, MD;  Location: [place] [facility];  Service: Cardiovascular;  Laterality: N/A;  . PERIPHERAL VASCULAR CATHETERIZATION N/A 08/10/2015   Procedure: A/V Shunt Intervention;  Surgeon: Algernon Huxley, MD;  Location: [place] [facility];  Service: Cardiovascular;  Laterality: N/A;  . PERIPHERAL VASCULAR CATHETERIZATION Left 04/11/2016   Procedure: A/V Shuntogram/Fistulagram;  Surgeon: Algernon Huxley, MD;  Location: [place] [facility];  Service: Cardiovascular;  Laterality: Left;  . TEE WITHOUT CARDIOVERSION N/A 11/16/2017   Procedure: TRANSESOPHAGEAL ECHOCARDIOGRAM (TEE);  Surgeon: Minna Merritts, MD;  Location: [facility];  Service: Cardiovascular;  Laterality: N/A;    Family History  Problem Relation Age of Onset  . Breast cancer Father 65  . Stroke Mother   . Heart attack Mother   . Heart Problems Sister   . Breast cancer Cousin 91       1 st cousin. Maternal     Social History:  reports that she has quit smoking. Her smoking use included cigarettes. She smoked 0.00 packs per day. She has never used smokeless tobacco. She reports that she does not drink alcohol or use drugs.  Allergies  Allergen Reactions  . Hydrocodone     Intolerant more than allergic  . Tape Itching    Skin Dermatitis/itching (tape adhesive) Skin Dermatitis/itching (tape adhesive)  . Tapentadol Itching    Skin Dermatitis/itching (tape adhesive) Skin  Dermatitis/itching (tape adhesive)     Medications:  I have reviewed the patient's current medications. Prior to Admission:  Medications Prior to Admission  Medication Sig Dispense Refill Last Dose  . aspirin EC 325 MG tablet Take 325 mg by mouth daily.   10/20/2019 at Unknown time  . atorvastatin (LIPITOR) 80 MG tablet Take 80 mg by mouth every evening.    10/20/2019 at Unknown time  . calcium acetate (PHOSLO) 667 MG capsule Take 3 capsules (2,001 mg total) by mouth 3 (three) times daily with meals.   10/20/2019 at Unknown time  . cephALEXin (KEFLEX) 500 MG capsule Take 1 capsule (500 mg total) by mouth 2 (two) times daily. 14 capsule 0 10/20/2019 at Unknown time  . cholecalciferol (VITAMIN D) 25 MCG tablet Take 1 tablet (1,000 Units total) by mouth daily.   10/20/2019 at Unknown time  . clopidogrel (PLAVIX) 75 MG tablet TAKE ONE TABLET BY MOUTH ONCE DAILY (Patient taking differently: Take 75 mg by mouth daily. ) 30 tablet 5 10/20/2019 at Unknown time  . docusate sodium (COLACE) 100 MG capsule Take 1 capsule (100 mg total) by mouth 2 (two) times daily. 60 capsule 2 10/20/2019 at Unknown time  . fluticasone (FLONASE) 50 MCG/ACT nasal spray 2 sprays by Each Nare route daily. Prior to CPAP  10/20/2019 at Unknown time  . gabapentin (NEURONTIN) 100 MG capsule Take 2 capsules (200 mg total) by mouth 3 (three) times daily. (Patient taking differently: Take 100 mg by mouth at bedtime. ) 90 capsule 0 10/20/2019 at Unknown time  . hydrocortisone (ANUSOL-HC) 2.5 % rectal cream Place 1 application rectally 2 (two) times daily. (Patient taking differently: Place 1 application rectally 2 (two) times daily as needed. ) 30 g 1 prn at prn  . insulin NPH-regular Human (NOVOLIN 70/30) (70-30) 100 UNIT/ML injection Inject 25 Units into the skin 2 (two) times daily with a meal. Except dialysis days. Do not take on Tuesdays, Thursdays, and Saturdays. 10 mL 11 10/20/2019 at Unknown time  . Ipratropium-Albuterol (COMBIVENT)  20-100 MCG/ACT AERS respimat Inhale 1 puff into the lungs every 6 (six) hours. 4 g 0 10/20/2019 at Unknown time  . levETIRAcetam (KEPPRA) 1000 MG tablet Take 1 tablet (1,000 mg total) by mouth daily. 30 tablet 0 10/20/2019 at Unknown time  . lidocaine-prilocaine (EMLA) cream Apply 1 application topically as needed (for port access).    prn at prn  . losartan (COZAAR) 100 MG tablet Take 100 mg by mouth daily. Takes in the morning   10/20/2019 at Unknown time  . metoprolol succinate (TOPROL-XL) 100 MG 24 hr tablet Take 100 mg by mouth daily. Take with or immediately following a meal.    10/20/2019 at Unknown time  . omeprazole (PRILOSEC) 20 MG capsule Take 20 mg by mouth 2 (two) times daily.    10/20/2019 at Unknown time  . oxyCODONE-acetaminophen (PERCOCET) 5-325 MG tablet Take 1 tablet by mouth every 4 (four) hours as needed for severe pain. 12 tablet 0 prn at prn  . torsemide (DEMADEX) 100 MG tablet Take 100 mg by mouth daily.   10/20/2019 at Unknown time   Scheduled: . aspirin EC  325 mg Oral Daily  . atorvastatin  80 mg Oral QPM  . calcium acetate  2,001 mg Oral TID WC  . cephALEXin  500 mg Oral BID  . cholecalciferol  1,000 Units Oral Daily  . clopidogrel  75 mg Oral Daily  . dextromethorphan-guaiFENesin  1 tablet Oral BID  . docusate sodium  100 mg Oral BID  . fluticasone  1 spray Each Nare Daily  . gabapentin  100 mg Oral QHS  . Ipratropium-Albuterol  1 puff Inhalation Q6H  . levETIRAcetam  1,000 mg Oral Daily  . losartan  50 mg Oral Daily  . mouth rinse  15 mL Mouth Rinse BID  . metoprolol succinate  50 mg Oral Daily  . pantoprazole  40 mg Oral Daily  . torsemide  100 mg Oral Daily    ROS: Unable to provide due to mental status  Physical Examination: Blood pressure 124/61, pulse 74, temperature 98.2 F (36.8 C), temperature source Oral, resp. rate 20, height 5\' 9"  (1.753 m), weight 136.1 kg, SpO2 97 %.  HEENT-  Normocephalic, no lesions, without obvious abnormality.  Normal  external eye and conjunctiva.  Normal TM's bilaterally.  Normal auditory canals and external ears. Normal external nose, mucus membranes and septum.  Normal pharynx. Cardiovascular- S1, S2 normal, pulses palpable throughout   Lungs- chest clear, no wheezing, rales, normal symmetric air entry Abdomen- soft, non-tender; bowel sounds normal; no masses,  no organomegaly Extremities- mild BLE edema Lymph-no adenopathy palpable Musculoskeletal-no joint tenderness, deformity or swelling Skin-warm and dry, no hyperpigmentation, vitiligo, or suspicious lesions  Neurological Examination   Mental Status: Alert, feeding herself breakfast.  Oriented to  name but not to place or year.  Speech fluent without evidence of aphasia.  Able to follow simple commands but requires extensive reinforcement for 3-step commands. Cranial Nerves: II: Visual fields grossly normal, pupils equal, round, reactive to light and accommodation III,IV, VI: ptosis not present, extra-ocular motions intact bilaterally V,VII: smile symmetric, facial light touch sensation normal bilaterally VIII: hearing normal bilaterally IX,X: gag reflex present XI: bilateral shoulder shrug XII: midline tongue extension Motor: Right : Upper extremity   5/5    Left:     Upper extremity   5/5  Lower extremity   5/5     Lower extremity   5/5 Tone and bulk:normal tone throughout; no atrophy noted Sensory: Pinprick and light touch intact throughout, bilaterally Deep Tendon Reflexes: Symmetric throughout Plantars: Right: mute   Left: mute Cerebellar: Normal finger-to-nose and normal heel-to-shin testing bilaterally Gait: not tested due to safety concerns    Laboratory Studies:   Basic Metabolic Panel: Recent Labs  Lab 10/18/19 1303 10/18/19 1303 10/21/19 0325 10/21/19 0325 10/22/19 0636 10/23/19 0748 10/24/19 0434  NA 134*  --  137  --  134* 135 137  K 5.4*  --  4.7  --  7.0* 4.7 4.4  CL 92*  --  95*  --  92* 92* 93*  CO2 23  --  23   --  20* 26 31  GLUCOSE 188*  --  148*  --  157* 142* 154*  BUN 54*  --  82*  --  92* 48* 38*  CREATININE 10.92*  --  14.76*  --  16.39* 10.73* 8.80*  CALCIUM 9.4   < > 9.9   < > 9.1 8.8* 9.5   < > = values in this interval not displayed.    Liver Function Tests: Recent Labs  Lab 10/18/19 1303 10/21/19 0325  AST 14* 11*  ALT 13 10  ALKPHOS 81 74  BILITOT 0.8 0.6  PROT 8.4* 8.2*  ALBUMIN 3.9 4.0   No results for input(s): LIPASE, AMYLASE in the last 168 hours. No results for input(s): AMMONIA in the last 168 hours.  CBC: Recent Labs  Lab 10/18/19 1303 10/21/19 0325 10/22/19 0636 10/24/19 0434  WBC 11.4* 8.0 9.1 7.4  NEUTROABS 8.8* 5.6  --   --   HGB 9.9* 9.8* 10.1* 9.1*  HCT 32.2* 33.7* 32.5* 29.0*  MCV 89.4 94.7 89.0 87.9  PLT 268 197 235 207    Cardiac Enzymes: No results for input(s): CKTOTAL, CKMB, CKMBINDEX, TROPONINI in the last 168 hours.  BNP: Invalid input(s): POCBNP  CBG: Recent Labs  Lab 10/23/19 0548 10/23/19 1236 10/23/19 1808 10/23/19 2331 10/24/19 0449  GLUCAP 127* 123* 149* 116* 146*    Microbiology: Results for orders placed or performed during the hospital encounter of 10/21/19  Respiratory Panel by RT PCR (Flu A&B, Covid) - Nasopharyngeal Swab     Status: None   Collection Time: 10/21/19  6:24 AM   Specimen: Nasopharyngeal Swab  Result Value Ref Range Status   SARS Coronavirus 2 by RT PCR NEGATIVE NEGATIVE Final    Comment: (NOTE) SARS-CoV-2 target nucleic acids are NOT DETECTED. The SARS-CoV-2 RNA is generally detectable in upper respiratoy specimens during the acute phase of infection. The lowest concentration of SARS-CoV-2 viral copies this assay can detect is 131 copies/mL. A negative result does not preclude SARS-Cov-2 infection and should not be used as the sole basis for treatment or other patient management decisions. A negative result may occur with  improper  specimen collection/handling, submission of specimen  other than nasopharyngeal swab, presence of viral mutation(s) within the areas targeted by this assay, and inadequate number of viral copies (<131 copies/mL). A negative result must be combined with clinical observations, patient history, and epidemiological information. The expected result is Negative. Fact Sheet for Patients:  PinkCheek.be Fact Sheet for Healthcare Providers:  GravelBags.it This test is not yet ap proved or cleared by the [place] FDA and  has been authorized for detection and/or diagnosis of SARS-CoV-2 by FDA under an Emergency Use Authorization (EUA). This EUA will remain  in effect (meaning this test can be used) for the duration of the COVID-19 declaration under Section 564(b)(1) of the Act, 21 U.S.C. section 360bbb-3(b)(1), unless the authorization is terminated or revoked sooner.    Influenza A by PCR NEGATIVE NEGATIVE Final   Influenza B by PCR NEGATIVE NEGATIVE Final    Comment: (NOTE) The Xpert Xpress SARS-CoV-2/FLU/RSV assay is intended as an aid in  the diagnosis of influenza from Nasopharyngeal swab specimens and  should not be used as a sole basis for treatment. Nasal washings and  aspirates are unacceptable for Xpert Xpress SARS-CoV-2/FLU/RSV  testing. Fact Sheet for Patients: PinkCheek.be Fact Sheet for Healthcare Providers: GravelBags.it This test is not yet approved or cleared by the [place] FDA and  has been authorized for detection and/or diagnosis of SARS-CoV-2 by  FDA under an Emergency Use Authorization (EUA). This EUA will remain  in effect (meaning this test can be used) for the duration of the  Covid-19 declaration under Section 564(b)(1) of the Act, 21  U.S.C. section 360bbb-3(b)(1), unless the authorization is  terminated or revoked. Performed at [facility], [address] [place], [place]  [address]   Blood culture (routine x 2)     Status: None (Preliminary result)   Collection Time: 10/21/19  7:25 AM   Specimen: BLOOD  Result Value Ref Range Status   Specimen Description BLOOD RIGHT ARM  Final   Special Requests   Final    BOTTLES DRAWN AEROBIC AND ANAEROBIC Blood Culture adequate volume   Culture   Final    NO GROWTH 3 DAYS Performed at [facility], [address] [place], [place] [address]    Report Status PENDING  Incomplete  Blood culture (routine x 2)     Status: None (Preliminary result)   Collection Time: 10/21/19  7:45 AM   Specimen: BLOOD  Result Value Ref Range Status   Specimen Description BLOOD RIGHT HAND  Final   Special Requests   Final    BOTTLES DRAWN AEROBIC AND ANAEROBIC Blood Culture adequate volume   Culture   Final    NO GROWTH 3 DAYS Performed at [facility], [address], [place], [place] [address]    Report Status PENDING  Incomplete    Coagulation Studies: No results for input(s): LABPROT, INR in the last 72 hours.  Urinalysis:  Recent Labs  Lab 10/18/19 2244 10/21/19 0249  COLORURINE YELLOW* YELLOW*  LABSPEC 1.010 1.012  PHURINE 8.0 7.0  GLUCOSEU 50* NEGATIVE  HGBUR NEGATIVE NEGATIVE  BILIRUBINUR NEGATIVE NEGATIVE  KETONESUR NEGATIVE NEGATIVE  PROTEINUR >=300* 100*  NITRITE NEGATIVE NEGATIVE  LEUKOCYTESUR TRACE* NEGATIVE    Lipid Panel:     Component Value Date/Time   CHOL 202 (H) 03/07/2018 1227   CHOL 78 12/25/2013 0413   TRIG 222 (H) 03/07/2018 1227   TRIG 53 12/25/2013 0413   HDL 46 03/07/2018 1227   HDL 31 (  L) 12/25/2013 0413   CHOLHDL 4.4 03/07/2018 1227   VLDL 44 (H) 03/07/2018 1227   VLDL 11 12/25/2013 0413   LDLCALC 112 (H) 03/07/2018 1227   LDLCALC 36 12/25/2013 0413    HgbA1C:  Lab Results  Component Value Date   HGBA1C 8.3 (H) 07/31/2019    Urine Drug Screen:      Component Value Date/Time   LABOPIA NONE DETECTED 11/14/2017 2156   COCAINSCRNUR NONE DETECTED 11/14/2017  2156   LABBENZ NONE DETECTED 11/14/2017 2156   AMPHETMU NONE DETECTED 11/14/2017 2156   THCU POSITIVE (A) 11/14/2017 2156   LABBARB NONE DETECTED 11/14/2017 2156    Alcohol Level: No results for input(s): ETH in the last 168 hours.  Other results: EKG: normal sinus rhythm at 73 bpm.  Imaging: No results found.   Assessment/Plan: 69 year old female with medical history significant of ESRD-HD (MWF), hypertension, hyperlipidemia, COPD on 4 L oxygen, stroke, GERD, COVID-19 infection (07/30/2019), GI bleeding, CAD, carotid artery stenosis, seizure and delirium who presented with unresponsiveness and hypoglycemia.  Patient also with BUN/Cr worse than at baseline sue to missed HD sessions.  Patient has improved since presentation but is still not back to baseline.  Has had delirium with hospitalizations in the past but with the significant hypoglycemia documented for an unknown period of time, unclear if there may be cerebral hypoglycemic injury.   Missed HD sessions may be complicating the picture as well.  Doubt subclinical seizure activity at this time.  Recommendations: 1. MRI of the brain without contrast 2. Agree with continued addressing of metabolic issues and HD 3. Continue Keppra at current dose   Alexis Goodell, MD Neurology [phone] 10/24/2019, 10:39 AM

## 2019-10-24 NOTE — Evaluation (Signed)
Occupational Therapy Evaluation Patient Details Name: Cynthia Dean MRN: [id number] DOB: [birth year] Today's Date: 10/24/2019    History of Present Illness 69 y.o. female with a history of ESRD, T2DM, 4L O2-dependent COPD, CAD, carotid stenosis, seizure disorder, HTN, HLD, CVA, GERD, recieves HD GI bleeding, and recent covid-19 infection who presented 3/29 with unresponsiveness and severe hypoglycemia in the setting of insulin administration with inadequate per oral intake. Fiance activated EMS. Police arrived at the scene and patient with agonal respirations and they started CPR. Most recent blood sugar 146.   Clinical Impression   Ms. Rothschild presents to OT with decreased cognition in today's evaluation.  Occupational profile was difficult to gather, and some eval info was gathered from chart review and PT report.  Further assessment is needed re: pt's home setup and caregiver support available.  Ms. Currier generally requires supervision to min assist for basic ADLs, including functional mobility per PT report.  She has the physical capacity to complete these ADLs, but is currently limited by her cognition.  She was able to state her name and birthday, but is disoriented to setting, situation, and time.  She was unable to read the date on her whiteboard when prompted by OTR.  Ms. Mathisen was able to read the time on her analog clock, however.  OTR provided pt with education on compensatory cognitive strategies to assist with orientation.  Pt unable to demonstrate use of strategies on her own.  Ms. Oravec presents with perseveration on certain topics/phrases.  Also suspect some form of aphasia, as she had difficulty with word finding and naming her food utensils (in addition to general decreased orientation).  Ms. Lofaro will continue to benefit from skilled OT services in acute setting to address cognitive impairments and safety and independence with ADLs.  [place] with 24 hour  supervision at discharge, however this recommendation may change depending on level of caregiver support available at home.  If pt's family is unable to provide 24 hour supervision at home, then recommend a higher level of care at discharge.    Follow Up Recommendations  Home health OT;Supervision/Assistance - 24 hour    Equipment Recommendations       Recommendations for Other Services       Precautions / Restrictions Precautions Precautions: Fall Restrictions Weight Bearing Restrictions: No      Mobility Bed Mobility Overal bed mobility: Needs Assistance Bed Mobility: Supine to Sit     Supine to sit: Supervision;HOB elevated        Transfers Overall transfer level: Needs assistance Equipment used: Rolling walker (2 wheeled) Transfers: Sit to/from Stand Sit to Stand: Min guard         General transfer comment: Did not assess mobility during session, info gathered per PT report    Balance Overall balance assessment: Needs assistance Sitting-balance support: Feet supported Sitting balance-Leahy Scale: Good       Standing balance-Leahy Scale: Fair                             ADL either performed or assessed with clinical judgement   ADL                                         General ADL Comments: Pt requires mod verbal cues for sequencing and safety in ADLs. She has physical  capacity to complete BADLs with mod I-min assist, but confusion and decreased orienation impact her ability to complete ADLs safely.     Vision Baseline Vision/History: Wears glasses Wears Glasses: (pt reports she has glasses, but does not wear them often like she "is supposed to") Additional Comments: will benefit from further assessment     Perception     Praxis      Pertinent Vitals/Pain Pain Assessment: No/denies pain     Hand Dominance     Extremity/Trunk Assessment Upper Extremity Assessment Upper Extremity Assessment: Overall WFL for  tasks assessed(grossly 4/5)   Lower Extremity Assessment Lower Extremity Assessment: Defer to PT evaluation(grossly 4-/5)   Cervical / Trunk Assessment Cervical / Trunk Assessment: Normal   Communication Communication Communication: Other (comment);Expressive difficulties(Pt perseverative on certain thoughts, suspect possible aphasia or trouble with word finding in addition to general disorientation and decreased recall)   Cognition Arousal/Alertness: Awake/alert Behavior During Therapy: WFL for tasks assessed/performed Overall Cognitive Status: No family/caregiver present to determine baseline cognitive functioning                                 General Comments: Pt oriented to name and birthday, disoriented to time place situation. verbal perseveration noted.  Pt unable to read date off whiteboard when oriented by OTR.  Pt had difficulty with word finding,   General Comments       Exercises Other Exercises Other Exercises: provided education re: fall and safety precautions, OT role and plan of care, self care, feeding, and compensatory cognitive strategies Other Exercises: provided supervision assist and min verbal cues in self-feeding   Shoulder Instructions      Home Living Family/patient expects to be discharged to:: Private residence Living Arrangements: Children(pt states she lives with her boyfriend and no one else, chart review reports she lives with children) Available Help at Discharge: Family;Available PRN/intermittently Type of Home: House Home Access: Stairs to enter [employer] of Steps: 2 Entrance Stairs-Rails: Right;Left;Can reach both Home Layout: One level               Home Equipment: Walker - 2 wheels   Additional Comments: PLOF and home setup gathered from chart review.  Pt states that she has a RW at home, but is unreliable historian.  Need further information about home setup, equipment, and caregiver support.       Prior Functioning/Environment          Comments: PLOF is unclear.  Pt reports she uses a RW for household mobility, occasionally receives assistance from her boyfriend for dressing and bathing.        OT Problem List: Decreased strength;Decreased range of motion;Decreased activity tolerance;Impaired balance (sitting and/or standing);Decreased cognition;Decreased safety awareness;Decreased knowledge of use of DME or AE;Decreased knowledge of precautions      OT Treatment/Interventions: Self-care/ADL training;Therapeutic exercise;Energy conservation;DME and/or AE instruction;Therapeutic activities;Cognitive remediation/compensation;Visual/perceptual remediation/compensation;Patient/family education;Balance training    OT Goals(Current goals can be found in the care plan section) Acute Rehab OT Goals Patient Stated Goal: to go home OT Goal Formulation: With patient Time For Goal Achievement: 11/07/19 Potential to Achieve Goals: Good ADL Goals Pt Will Perform Lower Body Dressing: with min assist;with adaptive equipment;sit to/from stand(with LRAD) Pt Will Transfer to Toilet: with min guard assist;ambulating;regular height toilet(with LRAD) Additional ADL Goal #1: Pt will verbalize x3 fall prevention strategies with min assist to optimize safety and independence in ADLs. Additional ADL Goal #2: Pt  will demonstrate use of x3 cognitive compensatory strategies with min assist for increased safety and independence in ADLs.  OT Frequency: Min 2X/week   Barriers to D/C: Other (comment)  caregiver support unclear       Co-evaluation              AM-PAC OT "6 Clicks" Daily Activity     Outcome Measure Help from another person eating meals?: A Little Help from another person taking care of personal grooming?: A Little Help from another person toileting, which includes using toliet, bedpan, or urinal?: A Little Help from another person bathing (including washing, rinsing, drying)?: A  Little Help from another person to put on and taking off regular upper body clothing?: A Little Help from another person to put on and taking off regular lower body clothing?: A Little 6 Click Score: 18   End of Session    Activity Tolerance: Patient tolerated treatment well Patient left: in chair;with call bell/phone within reach;with chair alarm set  OT Visit Diagnosis: Other abnormalities of gait and mobility (R26.89);Other symptoms and signs involving cognitive function                Time: 1021-1173 OT Time Calculation (min): 31 min Charges:  OT General Charges $OT Visit: 1 Visit OT Evaluation $OT Eval Moderate Complexity: 1 Mod OT Treatments $Self Care/Home Management : 23-37 mins  Myrtie Hawk Calee Nugent, OTR/L 10/24/19, 11:29 AM

## 2019-10-24 NOTE — Progress Notes (Signed)
PROGRESS NOTE  Cynthia Dean  GEX:[id number] DOB: 10-Oct-1950 DOA: 10/21/2019 PCP: Lowella Bandy, MD   Brief Narrative: Cynthia Dean is a 69 y.o. female with a history of ESRD, T2DM, 4L O2-dependent COPD, CAD, carotid stenosis, seizure disorder, HTN, HLD, CVA, GERD, GI bleeding, and recent covid-19 infection who presented 3/29 with unresponsiveness and severe hypoglycemia in the setting of insulin administration with inadequate per oral intake. Fiance activated EMS. Police arrived at the scene and patient with agonal respirations and they started CPR. When EMS arrivedpatient had agonal respirations,but did have a pulse. They check her blood glucose which was 21. She received 250 cc of D10 and recheck blood glucose is 187 with improved responsiveness. She had been seen in the ED and prescribe keflex for UTI and received covid-19 2nd vaccination dose 4 days PTA and hadn't felt well since that time, missing HD x2. She was admitted, nephrology consulted for urgent HD due to hyperkalemia and has overseen dialysis. Mental status continues to be abnormal.   Assessment & Plan: Principal Problem:   Hypoglycemia Active Problems:   Essential hypertension   Hyperlipidemia   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)   CAD (coronary artery disease)   COPD (chronic obstructive pulmonary disease) (HCC)   ESRD on dialysis (HCC)   Stroke (HCC)   Seizure ([place])   Unresponsiveness   GERD (gastroesophageal reflux disease)  IDT2DM with severe hypoglycemia: - Holding 70/30 insulin, CBGs at goal. Likely to be discharged without scheduled insulin based on current trends. - D50 prn per protocol.   Acute encephalopathy: Uncertain etiology, though likely multifactorial with no focal deficits unlikely to be stroke (no acute infarcts on CT 3/26). Uncertain duration of hypoglycemia and possible loss of pulse at home portend poorer prognosis. Uremia also likely  contributing. With pyuria and urinary complaints, UTI may be contributing.  - Supportive care, optimize metabolic profile, avoid hypotension and ischemia, avoid hypoglycemia, treat UTI and monitor. While level of alertness has improved, confusion is significant. - With hx seizure disorder, continue AED. - MRI, neurology consultation.  - Delirium precautions. Has had delirium in the past with illness, so may be prudent to minimize duration of hospitalization. - Decreased home gabapentin from 200mg  TID to maximum dose of 100mg  qHS.  UTI:  - Restarted keflex 500mg  po BID x7 days planned.  ESRD with hyperkalemia which has improved:  - Continue hemodialysis per nephrology. Next 4/2. - [place]  HTN:  - Decrease antihypertensives to leave room for HD. Patient likely susceptible to cerebral hypoperfusion.  HLD:  - Continue statin  CAD, HFpEF:  - Continue DAPT, statin, beta blocker. No ACS evident at this time.   COPD:  - Continue supplemental oxygen as needed - Continue bronchodilators.   History of CVA: CT showed atrophy with periventricular small vessel disease. Several prior infarcts noted which appear stable. No acute infarct evident. No mass or hemorrhage. - Continue DAPT, statin, risk factor control.   GERD:  - Continue PPI  Obesity: Body mass index is 44.3 kg/m.   DVT prophylaxis: SCDs Code Status: Full Family Communication: None at bedside today Disposition Plan: Patient from home. Mental status remains markedly abnormal necessitating further work up, neurology consultation. May be stable for discharge 4/2 after HD.  Consultants:   Nephrology  Neurology  Procedures:   Dialysis   Antimicrobials:  Keflex   Subjective: Much more alert today, eating breakfast. Is not able to name the building or type of building we're in,  unable to name the year or any of her family members. Knows her name and birthday.   Objective: Vitals:   10/23/19 1942 10/23/19 1955 10/24/19  0448 10/24/19 1202  BP:  (!) 154/70 124/61 111/65  Pulse:  71 74 72  Resp:  16 20 20   Temp:  98 F (36.7 C) 98.2 F (36.8 C) 98.6 F (37 C)  TempSrc:  Oral Oral Oral  SpO2: 95% 95% 97% 93%  Weight:      Height:       No intake or output data in the 24 hours ending 10/24/19 1606 Filed Weights   10/21/19 0244  Weight: 136.1 kg   Gen: 69 y.o. female in no distress sitting in chair Pulm: Nonlabored breathing room air. Clear. CV: Regular rate and rhythm. No murmur, rub, or gallop. No JVD, no dependent edema. GI: Abdomen soft, non-tender, non-distended, with normoactive bowel sounds.  Ext: Warm, no deformities Skin: No new rashes, lesions or ulcers on visualized skin. Neuro: Alert, conversant but significant encephalopathy remains, though no longer drowsy. Moves all extremities.  Psych: Judgement and insight appear impaired. Mood euthymic & affect congruent.   Data Reviewed: I have personally reviewed following labs and imaging studies  CBC: Recent Labs  Lab 10/18/19 1303 10/21/19 0325 10/22/19 0636 10/24/19 0434  WBC 11.4* 8.0 9.1 7.4  NEUTROABS 8.8* 5.6  --   --   HGB 9.9* 9.8* 10.1* 9.1*  HCT 32.2* 33.7* 32.5* 29.0*  MCV 89.4 94.7 89.0 87.9  PLT 268 197 235 700   Basic Metabolic Panel: Recent Labs  Lab 10/18/19 1303 10/21/19 0325 10/22/19 0636 10/23/19 0748 10/24/19 0434  NA 134* 137 134* 135 137  K 5.4* 4.7 7.0* 4.7 4.4  CL 92* 95* 92* 92* 93*  CO2 23 23 20* 26 31  GLUCOSE 188* 148* 157* 142* 154*  BUN 54* 82* 92* 48* 38*  CREATININE 10.92* 14.76* 16.39* 10.73* 8.80*  CALCIUM 9.4 9.9 9.1 8.8* 9.5   GFR: Estimated Creatinine Clearance: 9.1 mL/min (A) (by C-G formula based on SCr of 8.8 mg/dL (H)). Liver Function Tests: Recent Labs  Lab 10/18/19 1303 10/21/19 0325  AST 14* 11*  ALT 13 10  ALKPHOS 81 74  BILITOT 0.8 0.6  PROT 8.4* 8.2*  ALBUMIN 3.9 4.0   CBG: Recent Labs  Lab 10/23/19 1236 10/23/19 1808 10/23/19 2331 10/24/19 0449  10/24/19 1158  GLUCAP 123* 149* 116* 146* 197*   Urine analysis:    Component Value Date/Time   COLORURINE YELLOW (A) 10/21/2019 0249   APPEARANCEUR HAZY (A) 10/21/2019 0249   APPEARANCEUR Clear 06/14/2014 1510   LABSPEC 1.012 10/21/2019 0249   LABSPEC 1.006 06/14/2014 1510   PHURINE 7.0 10/21/2019 0249   GLUCOSEU NEGATIVE 10/21/2019 0249   GLUCOSEU >=500 06/14/2014 1510   HGBUR NEGATIVE 10/21/2019 0249   BILIRUBINUR NEGATIVE 10/21/2019 0249   BILIRUBINUR Negative 06/14/2014 [place] 10/21/2019 0249   PROTEINUR 100 (A) 10/21/2019 0249   NITRITE NEGATIVE 10/21/2019 0249   LEUKOCYTESUR NEGATIVE 10/21/2019 0249   LEUKOCYTESUR Negative 06/14/2014 1510   Recent Results (from the past 240 hour(s))  Urine culture     Status: Abnormal   Collection Time: 10/18/19 10:44 PM   Specimen: Urine, Random  Result Value Ref Range Status   Specimen Description   Final    URINE, RANDOM Performed at [facility], [address], [place], [place] [address]    Special Requests   Final    NONE Performed at  [facility], [address], [place], [place] [address]    Culture MULTIPLE SPECIES PRESENT, SUGGEST RECOLLECTION (A)  Final   Report Status 10/20/2019 FINAL  Final  Respiratory Panel by RT PCR (Flu A&B, Covid) - Nasopharyngeal Swab     Status: None   Collection Time: 10/21/19  6:24 AM   Specimen: Nasopharyngeal Swab  Result Value Ref Range Status   SARS Coronavirus 2 by RT PCR NEGATIVE NEGATIVE Final    Comment: (NOTE) SARS-CoV-2 target nucleic acids are NOT DETECTED. The SARS-CoV-2 RNA is generally detectable in upper respiratoy specimens during the acute phase of infection. The lowest concentration of SARS-CoV-2 viral copies this assay can detect is 131 copies/mL. A negative result does not preclude SARS-Cov-2 infection and should not be used as the sole basis for treatment or other patient management decisions. A negative result may occur with   improper specimen collection/handling, submission of specimen other than nasopharyngeal swab, presence of viral mutation(s) within the areas targeted by this assay, and inadequate number of viral copies (<131 copies/mL). A negative result must be combined with clinical observations, patient history, and epidemiological information. The expected result is Negative. Fact Sheet for Patients:  PinkCheek.be Fact Sheet for Healthcare Providers:  GravelBags.it This test is not yet ap proved or cleared by the [place] FDA and  has been authorized for detection and/or diagnosis of SARS-CoV-2 by FDA under an Emergency Use Authorization (EUA). This EUA will remain  in effect (meaning this test can be used) for the duration of the COVID-19 declaration under Section 564(b)(1) of the Act, 21 U.S.C. section 360bbb-3(b)(1), unless the authorization is terminated or revoked sooner.    Influenza A by PCR NEGATIVE NEGATIVE Final   Influenza B by PCR NEGATIVE NEGATIVE Final    Comment: (NOTE) The Xpert Xpress SARS-CoV-2/FLU/RSV assay is intended as an aid in  the diagnosis of influenza from Nasopharyngeal swab specimens and  should not be used as a sole basis for treatment. Nasal washings and  aspirates are unacceptable for Xpert Xpress SARS-CoV-2/FLU/RSV  testing. Fact Sheet for Patients: PinkCheek.be Fact Sheet for Healthcare Providers: GravelBags.it This test is not yet approved or cleared by the [place] FDA and  has been authorized for detection and/or diagnosis of SARS-CoV-2 by  FDA under an Emergency Use Authorization (EUA). This EUA will remain  in effect (meaning this test can be used) for the duration of the  Covid-19 declaration under Section 564(b)(1) of the Act, 21  U.S.C. section 360bbb-3(b)(1), unless the authorization is  terminated or revoked. Performed at  [facility], [address] [place], [place] [address]   Blood culture (routine x 2)     Status: None (Preliminary result)   Collection Time: 10/21/19  7:25 AM   Specimen: BLOOD  Result Value Ref Range Status   Specimen Description BLOOD RIGHT ARM  Final   Special Requests   Final    BOTTLES DRAWN AEROBIC AND ANAEROBIC Blood Culture adequate volume   Culture   Final    NO GROWTH 3 DAYS Performed at [facility], [address], [place], [place] [address]    Report Status PENDING  Incomplete  Blood culture (routine x 2)     Status: None (Preliminary result)   Collection Time: 10/21/19  7:45 AM   Specimen: BLOOD  Result Value Ref Range Status   Specimen Description BLOOD RIGHT HAND  Final   Special Requests   Final    BOTTLES DRAWN AEROBIC AND ANAEROBIC  Blood Culture adequate volume   Culture   Final    NO GROWTH 3 DAYS Performed at [facility], [address] [place], [name] [address]    Report Status PENDING  Incomplete      Radiology Studies: No results found.  Scheduled Meds: . aspirin EC  325 mg Oral Daily  . atorvastatin  80 mg Oral QPM  . calcium acetate  2,001 mg Oral TID WC  . cephALEXin  500 mg Oral BID  . cholecalciferol  1,000 Units Oral Daily  . clopidogrel  75 mg Oral Daily  . dextromethorphan-guaiFENesin  1 tablet Oral BID  . docusate sodium  100 mg Oral BID  . fluticasone  1 spray Each Nare Daily  . gabapentin  100 mg Oral QHS  . Ipratropium-Albuterol  1 puff Inhalation Q6H  . levETIRAcetam  1,000 mg Oral Daily  . losartan  50 mg Oral Daily  . mouth rinse  15 mL Mouth Rinse BID  . metoprolol succinate  50 mg Oral Daily  . pantoprazole  40 mg Oral Daily  . torsemide  100 mg Oral Daily   Continuous Infusions:   LOS: 2 days   Time spent: 25 minutes.  Patrecia Pour, MD [facility] [email or web address] 10/24/2019, 4:06 PM

## 2019-10-24 NOTE — TOC Progression Note (Signed)
Transition of Care [facility]) - Progression Note    Patient Details  Name: Cynthia Dean MRN: [id number] Date of Birth: 02/22/1951  [facility] CM/SW Contact  [name], RN Phone Number: 10/24/2019, 10:15 AM  Clinical Narrative:    PT has assessed patient and recommends home health PT  Attempted to call patient's daughter Ms. Owens Shark.  VM not set up  Spoke with [name] and updated.  He is in agreement to home health and would like to use [facility] again.  Referral made to [facility] with [facility]   Expected Discharge Plan: [place]    Expected Discharge Plan and Services Expected Discharge Plan: [place]   Discharge Planning Services: CM Consult   Living arrangements for the past 2 months: Single Family Home                           HH Arranged: RN, PT [facility] Agency: [place] Date [facility] Agency Contacted: 10/24/19   Representative spoke with at [place]: [place] ([place]) Interventions    Readmission Risk Interventions Readmission Risk Prevention Plan 10/24/2019 08/07/2019 02/15/2019  Transportation Screening - Complete -  PCP or Specialist Appt within 3-5 Days - - -  HRI or Home Care Consult Complete - -  Palliative Care Screening - - -  Medication Review ([place]) - Complete Complete  HRI or [place] - Not Applicable -  Some recent data might be hidden

## 2019-10-25 LAB — BASIC METABOLIC PANEL
Anion gap: 17 — ABNORMAL HIGH (ref 5–15)
BUN: 55 mg/dL — ABNORMAL HIGH (ref 8–23)
CO2: 27 mmol/L (ref 22–32)
Calcium: 9.4 mg/dL (ref 8.9–10.3)
Chloride: 94 mmol/L — ABNORMAL LOW (ref 98–111)
Creatinine, Ser: 11.03 mg/dL — ABNORMAL HIGH (ref 0.44–1.00)
GFR calc Af Amer: 4 mL/min — ABNORMAL LOW (ref >60)
GFR calc non Af Amer: 3 mL/min — ABNORMAL LOW (ref >60)
Glucose, Bld: 139 mg/dL — ABNORMAL HIGH (ref 70–99)
Potassium: 4.5 mmol/L (ref 3.5–5.1)
Sodium: 138 mmol/L (ref 135–145)

## 2019-10-25 LAB — CBC
HCT: 28 % — ABNORMAL LOW (ref 36.0–46.0)
Hemoglobin: 8.5 g/dL — ABNORMAL LOW (ref 12.0–15.0)
MCH: 27.6 pg (ref 26.0–34.0)
MCHC: 30.4 g/dL (ref 30.0–36.0)
MCV: 90.9 fL (ref 80.0–100.0)
Platelets: 219 10*3/uL (ref 150–400)
RBC: 3.08 MIL/uL — ABNORMAL LOW (ref 3.87–5.11)
RDW: 15.9 % — ABNORMAL HIGH (ref 11.5–15.5)
WBC: 7.5 10*3/uL (ref 4.0–10.5)
nRBC: 0 % (ref 0.0–0.2)

## 2019-10-25 LAB — PHOSPHORUS: Phosphorus: 3 mg/dL (ref 2.5–4.6)

## 2019-10-25 LAB — GLUCOSE, CAPILLARY
Glucose-Capillary: 138 mg/dL — ABNORMAL HIGH (ref 70–99)
Glucose-Capillary: 121 mg/dL — ABNORMAL HIGH (ref 70–99)

## 2019-10-25 LAB — HEPATITIS B SURFACE ANTIGEN: Hepatitis B Surface Ag: NONREACTIVE

## 2019-10-25 MED ORDER — GABAPENTIN 100 MG PO CAPS: 100.0000 mg | ORAL_CAPSULE | Freq: Every day | ORAL | Status: DC

## 2019-10-25 MED ORDER — CEPHALEXIN 500 MG PO CAPS: 500.0000 mg | ORAL_CAPSULE | Freq: Two times a day (BID) | ORAL | 0 refills | Status: AC

## 2019-10-25 NOTE — Plan of Care (Addendum)
Continuing with plan of care. Patient has been refusing for nurse to bring any medications.  Dr. Bonner [facility]una notified.

## 2019-10-25 NOTE — Progress Notes (Signed)
PT Cancellation Note  Patient Details Name: Cynthia Dean MRN: [id number] DOB: 1950-12-19   Cancelled Treatment:      Per nursing report, pt refusing to get out of transport chair and has pulled IV out this AM. Pt appears more calm at this time, but does not respond to OT or PT at all, visually, verbally, or otherwise. Does not make any attempt to participate in fxl ADLs or mobility at this time. Will f/u as pt becomes more appropriate/as able. Thank you.   Lieutenant Diego PT, DPT 9:49 AM,10/25/19

## 2019-10-25 NOTE — Plan of Care (Signed)
Discharge instructions given to daughter, Maudie Mercury who verbalized understanding of teaching, medications, and follow-up instructions.  Patient discharged in stable condition with all belongings.

## 2019-10-25 NOTE — Progress Notes (Signed)
Hd started  

## 2019-10-25 NOTE — Progress Notes (Signed)
Pre dialysis assessment, patient will not respond verbally, seems alert with eyes open, turns head and follows with her eyes but does not speak, Dr Holley Raring at bedside

## 2019-10-25 NOTE — Progress Notes (Signed)
[facility]  ROUNDING NOTE   Subjective:  Patient completed hemodialysis today. Tolerated well. Awake but not conversant.  Objective:  Vital signs in last 24 hours:  Temp:  [97.1 F (36.2 C)-98.3 F (36.8 C)] 97.5 F (36.4 C) (04/02 1450) Pulse Rate:  [65-76] 72 (04/02 1500) Resp:  [13-26] 16 (04/02 1450) BP: (119-165)/(45-81) 142/72 (04/02 1500) SpO2:  [92 %-96 %] 96 % (04/02 1215)  Weight change:  Filed Weights   10/21/19 0244  Weight: 136.1 kg    Intake/Output: I/O last 3 completed shifts: In: 240 [P.O.:240] Out: 0    Intake/Output this shift:  Total I/O In: -  Out: 800 [Other:800]  Physical Exam: General: No acute distress  Head: Normocephalic, atraumatic. Moist oral mucosal membranes  Eyes: Anicteric  Neck: Supple, trachea midline  Lungs:  Clear to auscultation, normal effort  Heart: S1S2 no rubs  Abdomen:  Soft, nontender, bowel sounds present  Extremities: Trace peripheral edema.  Neurologic: Awake,, tracks with eyes, not conversant however.  Skin: No lesions  Access: LU AV fistula    Basic Metabolic Panel: Recent Labs  Lab 10/21/19 0325 10/21/19 0325 10/22/19 0636 10/22/19 0636 10/23/19 0748 10/24/19 0434 10/25/19 0443 10/25/19 1243  NA 137  --  134*  --  135 137 138  --   K 4.7  --  7.0*  --  4.7 4.4 4.5  --   CL 95*  --  92*  --  92* 93* 94*  --   CO2 23  --  20*  --  26 31 27   --   GLUCOSE 148*  --  157*  --  142* 154* 139*  --   BUN 82*  --  92*  --  48* 38* 55*  --   CREATININE 14.76*  --  16.39*  --  10.73* 8.80* 11.03*  --   CALCIUM 9.9   < > 9.1   < > 8.8* 9.5 9.4  --   PHOS  --   --   --   --   --   --   --  3.0   < > = values in this interval not displayed.    Liver Function Tests: Recent Labs  Lab 10/21/19 0325  AST 11*  ALT 10  ALKPHOS 74  BILITOT 0.6  PROT 8.2*  ALBUMIN 4.0   No results for input(s): LIPASE, AMYLASE in the last 168 hours. No results for input(s): AMMONIA in the last 168  hours.  CBC: Recent Labs  Lab 10/21/19 0325 10/22/19 0636 10/24/19 0434 10/25/19 0443  WBC 8.0 9.1 7.4 7.5  NEUTROABS 5.6  --   --   --   HGB 9.8* 10.1* 9.1* 8.5*  HCT 33.7* 32.5* 29.0* 28.0*  MCV 94.7 89.0 87.9 90.9  PLT 197 235 207 219    Cardiac Enzymes: No results for input(s): CKTOTAL, CKMB, CKMBINDEX, TROPONINI in the last 168 hours.  BNP: Invalid input(s): POCBNP  CBG: Recent Labs  Lab 10/24/19 0449 10/24/19 1158 10/24/19 1746 10/25/19 0633 10/25/19 1527  GLUCAP 146* 197* 140* 138* 121*    Microbiology: Results for orders placed or performed during the hospital encounter of 10/21/19  Respiratory Panel by RT PCR (Flu A&B, Covid) - Nasopharyngeal Swab     Status: None   Collection Time: 10/21/19  6:24 AM   Specimen: Nasopharyngeal Swab  Result Value Ref Range Status   SARS Coronavirus 2 by RT PCR NEGATIVE NEGATIVE Final    Comment: (NOTE) SARS-CoV-2 target  nucleic acids are NOT DETECTED. The SARS-CoV-2 RNA is generally detectable in upper respiratoy specimens during the acute phase of infection. The lowest concentration of SARS-CoV-2 viral copies this assay can detect is 131 copies/mL. A negative result does not preclude SARS-Cov-2 infection and should not be used as the sole basis for treatment or other patient management decisions. A negative result may occur with  improper specimen collection/handling, submission of specimen other than nasopharyngeal swab, presence of viral mutation(s) within the areas targeted by this assay, and inadequate number of viral copies (<131 copies/mL). A negative result must be combined with clinical observations, patient history, and epidemiological information. The expected result is Negative. Fact Sheet for Patients:  PinkCheek.be Fact Sheet for Healthcare Providers:  GravelBags.it This test is not yet ap proved or cleared by the [place] FDA and  has been  authorized for detection and/or diagnosis of SARS-CoV-2 by FDA under an Emergency Use Authorization (EUA). This EUA will remain  in effect (meaning this test can be used) for the duration of the COVID-19 declaration under Section 564(b)(1) of the Act, 21 U.S.C. section 360bbb-3(b)(1), unless the authorization is terminated or revoked sooner.    Influenza A by PCR NEGATIVE NEGATIVE Final   Influenza B by PCR NEGATIVE NEGATIVE Final    Comment: (NOTE) The Xpert Xpress SARS-CoV-2/FLU/RSV assay is intended as an aid in  the diagnosis of influenza from Nasopharyngeal swab specimens and  should not be used as a sole basis for treatment. Nasal washings and  aspirates are unacceptable for Xpert Xpress SARS-CoV-2/FLU/RSV  testing. Fact Sheet for Patients: PinkCheek.be Fact Sheet for Healthcare Providers: GravelBags.it This test is not yet approved or cleared by the [place] FDA and  has been authorized for detection and/or diagnosis of SARS-CoV-2 by  FDA under an Emergency Use Authorization (EUA). This EUA will remain  in effect (meaning this test can be used) for the duration of the  Covid-19 declaration under Section 564(b)(1) of the Act, 21  U.S.C. section 360bbb-3(b)(1), unless the authorization is  terminated or revoked. Performed at [facility], [address] [place], [place] [address]   Blood culture (routine x 2)     Status: None (Preliminary result)   Collection Time: 10/21/19  7:25 AM   Specimen: BLOOD  Result Value Ref Range Status   Specimen Description BLOOD RIGHT ARM  Final   Special Requests   Final    BOTTLES DRAWN AEROBIC AND ANAEROBIC Blood Culture adequate volume   Culture   Final    NO GROWTH 4 DAYS Performed at [facility], [address], [place], [place] [address]    Report Status PENDING  Incomplete  Blood culture (routine x 2)     Status: None (Preliminary result)    Collection Time: 10/21/19  7:45 AM   Specimen: BLOOD  Result Value Ref Range Status   Specimen Description BLOOD RIGHT HAND  Final   Special Requests   Final    BOTTLES DRAWN AEROBIC AND ANAEROBIC Blood Culture adequate volume   Culture   Final    NO GROWTH 4 DAYS Performed at [facility], [address] [place], [place] [address]    Report Status PENDING  Incomplete    Coagulation Studies: No results for input(s): LABPROT, INR in the last 72 hours.  Urinalysis: No results for input(s): COLORURINE, LABSPEC, PHURINE, GLUCOSEU, HGBUR, BILIRUBINUR, KETONESUR, PROTEINUR, UROBILINOGEN, NITRITE, LEUKOCYTESUR in the last 72 hours.  Invalid input(s): APPERANCEUR    Imaging: MR BRAIN  WO CONTRAST  Result Date: 10/24/2019 CLINICAL DATA:  Altered mental status. End-stage renal disease. Recent coronavirus infection. EXAM: MRI HEAD WITHOUT CONTRAST TECHNIQUE: Multiplanar, multiecho pulse sequences of the brain and surrounding structures were obtained without intravenous contrast. COMPARISON:  Head CT 10/18/2019 FINDINGS: Brain: The diffusion imaging does not show any acute or subacute infarction. No focal insult affects the brainstem. There are numerous old small vessel cerebellar infarctions. Cerebral hemispheres show old cortical and subcortical infarctions in both hemispheres affecting the frontal lobes and parietal lobes. There are confluent chronic small vessel ischemic changes of the hemispheric white matter. No mass lesion, hemorrhage, hydrocephalus or extra-axial collection. There is some hemosiderin deposition in the old infarctions, particularly the right frontal region. Vascular: Major vessels at the base of the brain show flow. Skull and upper cervical spine: Negative Sinuses/Orbits: Clear/normal Other: None IMPRESSION: No acute or subacute finding. 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 Chronic small-vessel ischemic changes of the cerebral hemispheric white  matter. Old bilateral cortical and subcortical infarctions in the frontal and parietal regions. Electronically Signed   By: Nelson Chimes M.D.   On: 10/24/2019 21:17     Medications:    . aspirin EC  325 mg Oral Daily  . atorvastatin  80 mg Oral QPM  . calcium acetate  2,001 mg Oral TID WC  . cephALEXin  500 mg Oral BID  . cholecalciferol  1,000 Units Oral Daily  . clopidogrel  75 mg Oral Daily  . dextromethorphan-guaiFENesin  1 tablet Oral BID  . docusate sodium  100 mg Oral BID  . fluticasone  1 spray Each Nare Daily  . gabapentin  100 mg Oral QHS  . Ipratropium-Albuterol  1 puff Inhalation Q6H  . levETIRAcetam  1,000 mg Oral Daily  . losartan  50 mg Oral Daily  . mouth rinse  15 mL Mouth Rinse BID  . metoprolol succinate  50 mg Oral Daily  . pantoprazole  40 mg Oral Daily  . torsemide  100 mg Oral Daily   acetaminophen, albuterol, dextrose, hydrALAZINE, hydrocortisone, lidocaine-prilocaine, LORazepam, oxyCODONE-acetaminophen  Assessment/ Plan:  69 y.o. 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/LU access  1.  ESRD on HD MWF.  Patient completed dialysis treatment today.  Overall tolerated treatment well.  Today she was awake and alert during the treatment but not conversing.  2.  Hyperkalemia.  Potassium remains within normal range at 4.5.  Continue to monitor.  3.  Anemia of chronic kidney disease.  Resume Epogen as an outpatient.  4.  Secondary hyperparathyroidism.  Phosphorus 3.0 and at target.  Maintain the patient on calcium acetate.   LOS: 3 Cynthia Dean 4/2/20213:33 PM

## 2019-10-25 NOTE — TOC Transition Note (Signed)
Transition of Care [facility]) - CM/SW Discharge Note   Patient Details  Name: Cynthia Dean MRN: [id number] Date of Birth: 1951/06/12  [facility]SW Contact:  [name], RN Phone Number: 10/25/2019, 8:43 AM   Clinical Narrative:    Patient to discharge today [name] with [facility] notified of discharge    Final next level of care: [place] Barriers to Discharge: No Barriers Identified   Patient Goals and CMS Choice        Discharge Placement                       Discharge Plan and Services   Discharge Planning Services: CM Consult                      HH Arranged: PT, OT, Nurse's Aide [facility] Agency: [place] Date [facility] Agency Contacted: 10/25/19   Representative spoke with at [place]: [place] ([place]) Interventions     Readmission Risk Interventions Readmission Risk Prevention Plan 10/25/2019 10/24/2019 08/07/2019  Transportation Screening Complete - Complete  PCP or Specialist Appt within 3-5 Days Not Complete - -  HRI or Home Care Consult Complete Complete -  Palliative Care Screening Not Applicable - -  Medication Review ([place]) Complete - Complete  HRI or [place] - - Complete  [place] - - Not Applicable  Some recent data might be hidden

## 2019-10-25 NOTE — Progress Notes (Addendum)
Patient is in a chair and refusing to get in the bed, refusing care and has also pulled out her IV. [name] has been reached out to who is on the way. The patient is jumping up, when not in the bed or chair, with her blanket wrapped around her.  At this time a nurse tech is sitting with the patient, Dr. Bonner Puna aware of current issue. At 10:15am patient continues to sit in the chair and refuses to move to the bed even with much encouragement and stressing the safety hazard patient continues to refuse.  [name] is at the bedside at this time.

## 2019-10-25 NOTE — Progress Notes (Signed)
This note also relates to the following rows which could not be included: Pulse Rate - Cannot attach notes to unvalidated device data Resp - Cannot attach notes to unvalidated device data BP - Cannot attach notes to unvalidated device data  Hd completed  

## 2019-10-25 NOTE — Progress Notes (Signed)
Patient is currently at dialysis, daughter was able to encourage her mother to go around 11:45.

## 2019-10-25 NOTE — Discharge Summary (Signed)
Physician Discharge Summary  Cynthia Dean LGX:[id number] DOB: 12-Oct-1950 DOA: 10/21/2019  PCP: Lowella Bandy, MD  Admit date: 10/21/2019 Discharge date: 10/25/2019  Admitted From: Home Disposition: Home   Recommendations for Outpatient Follow-up:  1. Follow up with PCP in 1-2 weeks 2. Please obtain BMP/CBC in one week  Home Health: PT, OT, RN, aide Equipment/Devices: None new Discharge Condition: Stable CODE STATUS: Full Diet recommendation: Renal, carb-modified, heart healthy  Brief/Interim Summary: Cynthia Dean is a 69 y.o. female with a history of ESRD, T2DM, 4L O2-dependent COPD, CAD, carotid stenosis, seizure disorder, HTN, HLD, CVA, GERD, GI bleeding, and recent covid-19 infection who presented 3/29 with unresponsiveness and severe hypoglycemia in the setting of insulin administration with inadequate per oral intake. Fiance activated EMS. Police arrived at the scene and patient with agonal respirations and they started CPR. When EMS arrivedpatient had agonal respirations,but did have a pulse. They check her blood glucose which was 21. She received 250 cc of D10 and recheck blood glucose is 187 with improved responsiveness. She had been seen in the ED and prescribe keflex for UTI and received covid-19 2nd vaccination dose 4 days PTA and hadn't felt well since that time, missing HD x2. She was admitted, nephrology consulted for urgent HD due to hyperkalemia and uremia, has overseen dialysis. Mental status continued to be abnormal after improvement in metabolic parameters so neurology was consulted. MRI was performed showing no acute changes. Recommendation was to continue keppra, continue dialysis regularly, and continue supportive care for encephalopathy with uncertainty as to the timing or extent of cerebral recovery.   Discharge Diagnoses:  Principal Problem:   Hypoglycemia Active Problems:   Essential hypertension   Hyperlipidemia   Type 2 diabetes  mellitus with ESRD (end-stage renal disease) (HCC)   CAD (coronary artery disease)   COPD (chronic obstructive pulmonary disease) (HCC)   ESRD on dialysis (HCC)   Stroke ([place])   Seizure ([place])   Unresponsiveness   GERD (gastroesophageal reflux disease)  IDT2DM with severe hypoglycemia: - Holding 70/30 insulin at discharge.  - CBGs at goal despite not receiving any insulin throughout stay (with exception of coadministration of 10 units and D50 due to hyperkalemia).   Acute encephalopathy: Uncertain etiology, though likely related to hypoglycemic episode. Currently multifactorial with no focal deficits unlikely to be stroke (no acute infarcts on CT 3/26). Uncertain duration of hypoglycemia and possible loss of pulse at home portend poorer prognosis. Uremia also likely contributing. With pyuria and urinary complaints, UTI may be contributing.  - Supportive care, optimize metabolic profile, avoid hypotension and ischemia, avoid hypoglycemia, treat UTI and monitor. While level of alertness has improved, confusion is significant and the patient will require supervision. - With hx seizure disorder, continue AED as recommended by neurology. - Patient growing increasingly resistant to medical interventions and evaluation. Has had delirium in the past with illness, so may be prudent to minimize duration of hospitalization. - Decreased home gabapentin from 200mg  TID to maximum dose of 100mg  qHS which will be continued at discharge.[name]  UTI:  - Restarted keflex 500mg  po BID, will Rx 5 more days.  ESRD with hyperkalemia which has improved:  - Continue hemodialysis per nephrology. Last on day of discharge 4/2. - [place]os[place]  HTN:  - Continue home medications  HLD:  - Continue statin  CAD, HFpEF:  - Continue DAPT, statin, beta blocker. No ACS evident at this time.   COPD:  - Continue supplemental oxygen as needed - Continue bronchodilators.  History of CVA: CT showed atrophy with  periventricular small vessel disease. Several prior infarcts noted which appear stable. No acute infarct evident. No mass or hemorrhage. No acute changes confirmed by subsequent MRI. - Continue DAPT, statin, risk factor control.   GERD:  - Continue PPI  Obesity: Body mass index is 44.3 kg/m.   Discharge Instructions Discharge Instructions    Discharge instructions   Complete by: As directed    You were admitted after an episode of severely low blood sugar. This has caused some damage causing confusion. It is not clear how much improvement there will be going forward, but the following is recommended:  - Do not stop dialysis. It is important not to miss hemodialysis on schedule.  - Continue taking keflex to complete the course for a UTI (this will be sent to your pharmacy for 5 more days in case you don't have this at home).  - Make sure not to take more gabapentin (neurontin) than 100mg  once at night. Avoid other sedating medications.  - Do NOT take insulin at this time until you follow up with your doctor as an outpatient.     Allergies as of 10/25/2019      Reactions   Hydrocodone    Intolerant more than allergic   Tape Itching   Skin Dermatitis/itching (tape adhesive) Skin Dermatitis/itching (tape adhesive)   Tapentadol Itching   Skin Dermatitis/itching (tape adhesive) Skin Dermatitis/itching (tape adhesive)      Medication List    STOP taking these medications   insulin NPH-regular Human (70-30) 100 UNIT/ML injection   oxyCODONE-acetaminophen 5-325 MG tablet Commonly known as: Percocet     TAKE these medications   aspirin EC 325 MG tablet Take 325 mg by mouth daily.   atorvastatin 80 MG tablet Commonly known as: LIPITOR Take 80 mg by mouth every evening.   calcium acetate 667 MG capsule Commonly known as: PHOSLO Take 3 capsules (2,001 mg total) by mouth 3 (three) times daily with meals.   cephALEXin 500 MG capsule Commonly known as: KEFLEX Take 1 capsule  (500 mg total) by mouth 2 (two) times daily for 5 days.   clopidogrel 75 MG tablet Commonly known as: PLAVIX TAKE ONE TABLET BY MOUTH ONCE DAILY   docusate sodium 100 MG capsule Commonly known as: Colace Take 1 capsule (100 mg total) by mouth 2 (two) times daily.   fluticasone 50 MCG/ACT nasal spray Commonly known as: FLONASE 2 sprays by Each Nare route daily. Prior to CPAP   gabapentin 100 MG capsule Commonly known as: Neurontin Take 1 capsule (100 mg total) by mouth at bedtime.   hydrocortisone 2.5 % rectal cream Commonly known as: ANUSOL-HC Place 1 application rectally 2 (two) times daily. What changed:   when to take this  reasons to take this   Ipratropium-Albuterol 20-100 MCG/ACT Aers respimat Commonly known as: COMBIVENT Inhale 1 puff into the lungs every 6 (six) hours.   levETIRAcetam 1000 MG tablet Commonly known as: KEPPRA Take 1 tablet (1,000 mg total) by mouth daily.   lidocaine-prilocaine cream Commonly known as: EMLA Apply 1 application topically as needed (for port access).   losartan 100 MG tablet Commonly known as: COZAAR Take 100 mg by mouth daily. Takes in the morning   metoprolol succinate 100 MG 24 hr tablet Commonly known as: TOPROL-XL Take 100 mg by mouth daily. Take with or immediately following a meal.   omeprazole 20 MG capsule Commonly known as: PRILOSEC Take 20 mg by mouth 2 (  two) times daily.   torsemide 100 MG tablet Commonly known as: DEMADEX Take 100 mg by mouth daily.   Vitamin D3 25 MCG tablet Commonly known as: Vitamin D Take 1 tablet (1,000 Units total) by mouth daily.      Follow-up Information    [name], Mliss Sax, MD. Schedule an appointment as soon as possible for a visit.   Specialty: Pediatrics Contact information: [place] [address] [phone]          Allergies  Allergen Reactions  . Hydrocodone     Intolerant more than allergic  . Tape Itching    Skin  Dermatitis/itching (tape adhesive) Skin Dermatitis/itching (tape adhesive)  . Tapentadol Itching    Skin Dermatitis/itching (tape adhesive) Skin Dermatitis/itching (tape adhesive)     Consultations:  Neurology  Nephrology  Procedures/Studies: DG Chest 2 View  Result Date: 10/18/2019 CLINICAL DATA:  Cough, low-grade fever EXAM: CHEST - 2 VIEW COMPARISON:  July 30, 2019 FINDINGS: The mediastinal contour and cardiac silhouette are stable. Mild increased pulmonary interstitium is identified bilaterally. No focal pneumonia or pleural effusion is noted. The bony structures are stable. IMPRESSION: Mild interstitial edema. No focal pneumonia. Electronically Signed   By: Abelardo Diesel M.D.   On: 10/18/2019 13:23   CT Head Wo Contrast  Result Date: 10/18/2019 CLINICAL DATA:  Altered mental status with disorientation EXAM: CT HEAD WITHOUT CONTRAST TECHNIQUE: Contiguous axial images were obtained from the base of the skull through the vertex without intravenous contrast. COMPARISON:  Head CT August 02, 2019 and brain MRI August 04, 2019 FINDINGS: Brain: Mild diffuse atrophy is stable. There is no intracranial mass, hemorrhage, extra-axial fluid collection, or midline shift. There is small vessel disease in the centra semiovale bilaterally. There is a stable prior infarct in the mid right frontal lobe. There is evidence of a prior infarct at the left temporal-parietal-occipital junction. There is evidence of a prior small infarct in the posterior right cerebellum. No acute appearing infarct evident. Vascular: No hyperdense vessel. There is calcification in each carotid siphon region. Skull: Bony calvarium appears intact. Sinuses/Orbits: There is mucosal thickening in multiple ethmoid air cells. Visualized orbits appear symmetric bilaterally. Other: Mastoid air cells are clear. IMPRESSION: Atrophy with periventricular small vessel disease. Several prior infarcts noted which appear stable. No acute infarct  evident. No mass or hemorrhage. There are foci of carotid artery calcification. There is mucosal thickening in multiple ethmoid air cells. Electronically Signed   By: Lowella Grip III M.D.   On: 10/18/2019 15:40   MR BRAIN WO CONTRAST  Result Date: 10/24/2019 CLINICAL DATA:  Altered mental status. End-stage renal disease. Recent coronavirus infection. EXAM: MRI HEAD WITHOUT CONTRAST TECHNIQUE: Multiplanar, multiecho pulse sequences of the brain and surrounding structures were obtained without intravenous contrast. COMPARISON:  Head CT 10/18/2019 FINDINGS: Brain: The diffusion imaging does not show any acute or subacute infarction. No focal insult affects the brainstem. There are numerous old small vessel cerebellar infarctions. Cerebral hemispheres show old cortical and subcortical infarctions in both hemispheres affecting the frontal lobes and parietal lobes. There are confluent chronic small vessel ischemic changes of the hemispheric white matter. No mass lesion, hemorrhage, hydrocephalus or extra-axial collection. There is some hemosiderin deposition in the old infarctions, particularly the right frontal region. Vascular: Major vessels at the base of the brain show flow. Skull and upper cervical spine: Negative Sinuses/Orbits: Clear/normal Other: None IMPRESSION: No acute or subacute finding. Numerous old small vessel infarctions of  the cerebellum. Chronic small-vessel ischemic changes of the cerebral hemispheric white matter. Old bilateral cortical and subcortical infarctions in the frontal and parietal regions. Electronically Signed   By: Nelson Chimes M.D.   On: 10/24/2019 21:17   DG Chest Portable 1 View  Result Date: 10/21/2019 CLINICAL DATA:  Cough EXAM: PORTABLE CHEST 1 VIEW COMPARISON:  Three days ago FINDINGS: Generalized interstitial coarsening. Normal heart size. An implantable loop recorder is present. Aortic atherosclerosis. There is no edema, consolidation, effusion, or pneumothorax.  IMPRESSION: Interstitial coarsening that could be congestive or bronchitic. Electronically Signed   By: Monte Fantasia M.D.   On: 10/21/2019 06:41   CT Renal Stone Study  Result Date: 10/18/2019 CLINICAL DATA:  Abdominal pain and vomiting. EXAM: CT ABDOMEN AND PELVIS WITHOUT CONTRAST TECHNIQUE: Multidetector CT imaging of the abdomen and pelvis was performed following the standard protocol without IV contrast. COMPARISON:  CT 06/27/2017 FINDINGS: Lower chest: Stable subpleural nodule in the right middle lobe since 2018, benign. Minimal lingular and and right middle lobe scarring. Hepatobiliary: No focal liver lesion on noncontrast exam. Possible stones or sludge in the gallbladder. No pericholecystic inflammation. No biliary dilatation. Pancreas: No ductal dilatation or inflammation. Spleen: Normal in size. Hypodensity the splenic hilum is grossly unchanged from prior, likely benign. Adrenals/Urinary Tract: Normal right adrenal gland. Small left adrenal myelolipoma with small fat density nodule. Bilateral renal parenchymal atrophy. There is bilateral perinephric edema, right greater than left. Multiple nonobstructing stones in both kidneys including a 7 mm stone in the right renal pelvis. No ureteral calculi. No hydronephrosis. Small bilateral low-density renal lesions consistent with cysts. Urinary bladder is partially distended. No bladder stone. Stomach/Bowel: Small hiatal hernia. Stomach is otherwise unremarkable. Normal positioning of the ligament of Treitz. No bowel obstruction or inflammation. Normal appendix. Mild colonic diverticulosis without diverticulitis. No colonic inflammation. Small previously described cecal lipoma is partially obscured by motion, but grossly stable. Vascular/Lymphatic: Advanced aortic and branch atherosclerosis. No aortic aneurysm. No bulky abdominopelvic adenopathy. Reproductive: Left ovarian cyst has diminished in size from prior exam, currently 2 cm, previously 3.6 cm.  Uterus and right ovary are normal. No new adnexal mass. Other: No free air or ascites. No intra-abdominal abscess. Small fat containing umbilical hernia. Musculoskeletal: Degenerative change in the lumbar spine and sacroiliac joint with vacuum phenomena. Generalized increased bone density consistent with chronic renal disease. There are no acute or suspicious osseous abnormalities. IMPRESSION: 1. Bilateral perinephric stranding that may be chronic or secondary to urinary tract infection. 2. Bilateral nonobstructing renal calculi. There is a 7 mild stone in the right renal pelvis but no hydronephrosis. 3. Colonic diverticulosis without diverticulitis. 4. Possible gallstones or sludge in the gallbladder. No pericholecystic inflammation. 5. Decreased size of left ovarian cyst, currently 2 cm, previously 3.6 cm, consistent with benign process. Aortic Atherosclerosis (ICD10-I70.0). Electronically Signed   By: Keith Rake M.D.   On: 10/18/2019 21:32   CUP PACEART REMOTE DEVICE CHECK  Result Date: 10/03/2019 [facility] summary report received. Battery status OK. Normal device function. No new symptom episodes, tachy episodes, brady, or pause episodes. No new AF episodes. Monthly summary reports and ROV/PRN L. Cotten, MSN, RN   Hemodialysis  Subjective: Confused but alert, interactive, no pain or other complaints this morning. Later in the morning she is extremely resistant to requests to get in bed for safety, not taking medications, and required coercion from her daughter to allow for routine dialysis.   Discharge Exam: Vitals:   10/25/19 1400 10/25/19 1415  BP: [name])  139/59 (!) 149/71  Pulse: 71 69  Resp: (!) 23 14  Temp:    SpO2:     General: Pt is alert, awake, not in acute distress Cardiovascular: RRR, S1/S2 +, no rubs, no gallops Respiratory: CTA bilaterally, no wheezing, no rhonchi Abdominal: Soft, NT, ND, bowel sounds + Extremities: No edema, no cyanosis Neuro/psych: Alert, incompletely  oriented, no focal deficits.  Labs: BNP (last 3 results) Recent Labs    12/31/18 0818 07/30/19 1957  BNP 528.0* 812.7*   Basic Metabolic Panel: Recent Labs  Lab 10/21/19 0325 10/22/19 0636 10/23/19 0748 10/24/19 0434 10/25/19 0443  NA 137 134* 135 137 138  K 4.7 7.0* 4.7 4.4 4.5  CL 95* 92* 92* 93* 94*  CO2 23 20* 26 31 27   GLUCOSE 148* 157* 142* 154* 139*  BUN 82* 92* 48* 38* 55*  CREATININE 14.76* 16.39* 10.73* 8.80* 11.03*  CALCIUM 9.9 9.1 8.8* 9.5 9.4   Liver Function Tests: Recent Labs  Lab 10/21/19 0325  AST 11*  ALT 10  ALKPHOS 74  BILITOT 0.6  PROT 8.2*  ALBUMIN 4.0   No results for input(s): LIPASE, AMYLASE in the last 168 hours. No results for input(s): AMMONIA in the last 168 hours. CBC: Recent Labs  Lab 10/21/19 0325 10/22/19 0636 10/24/19 0434 10/25/19 0443  WBC 8.0 9.1 7.4 7.5  NEUTROABS 5.6  --   --   --   HGB 9.8* 10.1* 9.1* 8.5*  HCT 33.7* 32.5* 29.0* 28.0*  MCV 94.7 89.0 87.9 90.9  PLT 197 235 207 219   Cardiac Enzymes: No results for input(s): CKTOTAL, CKMB, CKMBINDEX, TROPONINI in the last 168 hours. BNP: Invalid input(s): POCBNP CBG: Recent Labs  Lab 10/23/19 2331 10/24/19 0449 10/24/19 1158 10/24/19 1746 10/25/19 0633  GLUCAP 116* 146* 197* 140* 138*   D-Dimer No results for input(s): DDIMER in the last 72 hours. Hgb A1c No results for input(s): HGBA1C in the last 72 hours. Lipid Profile No results for input(s): CHOL, HDL, LDLCALC, TRIG, CHOLHDL, LDLDIRECT in the last 72 hours. Thyroid function studies No results for input(s): TSH, T4TOTAL, T3FREE, THYROIDAB in the last 72 hours.  Invalid input(s): FREET3 Anemia work up No results for input(s): VITAMINB12, FOLATE, FERRITIN, TIBC, IRON, RETICCTPCT in the last 72 hours. Urinalysis    Component Value Date/Time   COLORURINE YELLOW (A) 10/21/2019 0249   APPEARANCEUR HAZY (A) 10/21/2019 0249   APPEARANCEUR Clear 06/14/2014 1510   LABSPEC 1.012 10/21/2019 0249    LABSPEC 1.006 06/14/2014 1510   PHURINE 7.0 10/21/2019 0249   GLUCOSEU NEGATIVE 10/21/2019 0249   GLUCOSEU >=500 06/14/2014 1510   HGBUR NEGATIVE 10/21/2019 0249   BILIRUBINUR NEGATIVE 10/21/2019 0249   BILIRUBINUR Negative 06/14/2014 [place] 10/21/2019 0249   PROTEINUR 100 (A) 10/21/2019 0249   NITRITE NEGATIVE 10/21/2019 0249   LEUKOCYTESUR NEGATIVE 10/21/2019 0249   LEUKOCYTESUR Negative 06/14/2014 1510    Microbiology Recent Results (from the past 240 hour(s))  Urine culture     Status: Abnormal   Collection Time: 10/18/19 10:44 PM   Specimen: Urine, Random  Result Value Ref Range Status   Specimen Description   Final    URINE, RANDOM Performed at [facility], [address] [place], [place] [address]    Special Requests   Final    NONE Performed at [facility], [address] [place],  [address]    Culture MULTIPLE SPECIES PRESENT, SUGGEST RECOLLECTION (A)  Final   Report Status 10/20/2019  FINAL  Final  Respiratory Panel by RT PCR (Flu A&B, Covid) - Nasopharyngeal Swab     Status: None   Collection Time: 10/21/19  6:24 AM   Specimen: Nasopharyngeal Swab  Result Value Ref Range Status   SARS Coronavirus 2 by RT PCR NEGATIVE NEGATIVE Final    Comment: (NOTE) SARS-CoV-2 target nucleic acids are NOT DETECTED. The SARS-CoV-2 RNA is generally detectable in upper respiratoy specimens during the acute phase of infection. The lowest concentration of SARS-CoV-2 viral copies this assay can detect is 131 copies/mL. A negative result does not preclude SARS-Cov-2 infection and should not be used as the sole basis for treatment or other patient management decisions. A negative result may occur with  improper specimen collection/handling, submission of specimen other than nasopharyngeal swab, presence of viral mutation(s) within the areas targeted by this assay, and inadequate number of viral copies (<131 copies/mL). A negative  result must be combined with clinical observations, patient history, and epidemiological information. The expected result is Negative. Fact Sheet for Patients:  PinkCheek.be Fact Sheet for Healthcare Providers:  GravelBags.it This test is not yet ap proved or cleared by the [place] FDA and  has been authorized for detection and/or diagnosis of SARS-CoV-2 by FDA under an Emergency Use Authorization (EUA). This EUA will remain  in effect (meaning this test can be used) for the duration of the COVID-19 declaration under Section 564(b)(1) of the Act, 21 U.S.C. section 360bbb-3(b)(1), unless the authorization is terminated or revoked sooner.    Influenza A by PCR NEGATIVE NEGATIVE Final   Influenza B by PCR NEGATIVE NEGATIVE Final    Comment: (NOTE) The Xpert Xpress SARS-CoV-2/FLU/RSV assay is intended as an aid in  the diagnosis of influenza from Nasopharyngeal swab specimens and  should not be used as a sole basis for treatment. Nasal washings and  aspirates are unacceptable for Xpert Xpress SARS-CoV-2/FLU/RSV  testing. Fact Sheet for Patients: PinkCheek.be Fact Sheet for Healthcare Providers: GravelBags.it This test is not yet approved or cleared by the [place] FDA and  has been authorized for detection and/or diagnosis of SARS-CoV-2 by  FDA under an Emergency Use Authorization (EUA). This EUA will remain  in effect (meaning this test can be used) for the duration of the  Covid-19 declaration under Section 564(b)(1) of the Act, 21  U.S.C. section 360bbb-3(b)(1), unless the authorization is  terminated or revoked. Performed at [facility], [address] [place], [place] [address]   Blood culture (routine x 2)     Status: None (Preliminary result)   Collection Time: 10/21/19  7:25 AM   Specimen: BLOOD  Result Value Ref Range Status   Specimen  Description BLOOD RIGHT ARM  Final   Special Requests   Final    BOTTLES DRAWN AEROBIC AND ANAEROBIC Blood Culture adequate volume   Culture   Final    NO GROWTH 4 DAYS Performed at [facility], [address] [place], [place] [address]    Report Status PENDING  Incomplete  Blood culture (routine x 2)     Status: None (Preliminary result)   Collection Time: 10/21/19  7:45 AM   Specimen: BLOOD  Result Value Ref Range Status   Specimen Description BLOOD RIGHT HAND  Final   Special Requests   Final    BOTTLES DRAWN AEROBIC AND ANAEROBIC Blood Culture adequate volume   Culture   Final    NO GROWTH 4 DAYS Performed at [facility], [place],  [place] [address]    Report Status PENDING  Incomplete    Time coordinating discharge: Approximately 40 minutes  Patrecia Pour, MD  [facility] 10/25/2019, 2:41 PM

## 2019-10-25 NOTE — Progress Notes (Signed)
OT Cancellation Note  Patient Details Name: Cynthia Dean MRN: [id number] DOB: [birth year]   Cancelled Treatment:    Reason Eval/Treat Not Completed: Other (comment)  Per nursing report, pt refusing to get out of transport chair and has pulled IV out this AM. Pt appears more calm at this time, but does not respond to OT or PT at all, visually, verbally, or otherwise. Does not make any attempt to participate in fxl ADLs or mobility at this time. Will f/u as pt becomes more appropriate/as able. Thank you.  Gerrianne Scale, [place], OTR/L ascom [phone] 10/25/19, 9:47 AM

## 2019-10-25 NOTE — Care Management Important Message (Signed)
Important Message  Patient Details  Name: Cynthia Dean MRN: [id number] Date of Birth: 03-11-1951   Medicare Important Message Given:  Yes     [name] 10/25/2019, 10:24 AM

## 2019-10-25 NOTE — Progress Notes (Signed)
Subjective: Patient attempted to leave facility.  Now refusing all care and communication.    Objective: Current vital signs: BP (!) 147/63 (BP Location: Right Arm)   Pulse 75   Temp 98.2 F (36.8 C)   Resp 18   Ht 5\' 9"  (1.753 m)   Wt 136.1 kg   SpO2 95%   BMI 44.30 kg/m  Vital signs in last 24 hours: Temp:  [98.2 F (36.8 C)-98.6 F (37 C)] 98.2 F (36.8 C) (04/02 0636) Pulse Rate:  [68-75] 75 (04/02 0636) Resp:  [18-20] 18 (04/02 0636) BP: (111-147)/(45-65) 147/63 (04/02 0636) SpO2:  [93 %-96 %] 95 % (04/02 0636)  Intake/Output from previous day: 04/01 0701 - 04/02 0700 In: 240 [P.O.:240] Out: 0  Intake/Output this shift: No intake/output data recorded. Nutritional status:  Diet Order            Diet heart healthy/carb modified Room service appropriate? Yes; Fluid consistency: Thin  Diet effective now              Neurologic Exam: Unable to perform.  Patient reports she does not want to talk to anyone and refuses to cooperate with examination  Lab Results: Basic Metabolic Panel: Recent Labs  Lab 10/21/19 0325 10/21/19 0325 10/22/19 0636 10/22/19 0636 10/23/19 0748 10/24/19 0434 10/25/19 0443  NA 137  --  134*  --  135 137 138  K 4.7  --  7.0*  --  4.7 4.4 4.5  CL 95*  --  92*  --  92* 93* 94*  CO2 23  --  20*  --  26 31 27   GLUCOSE 148*  --  157*  --  142* 154* 139*  BUN 82*  --  92*  --  48* 38* 55*  CREATININE 14.76*  --  16.39*  --  10.73* 8.80* 11.03*  CALCIUM 9.9   < > 9.1   < > 8.8* 9.5 9.4   < > = values in this interval not displayed.    Liver Function Tests: Recent Labs  Lab 10/18/19 1303 10/21/19 0325  AST 14* 11*  ALT 13 10  ALKPHOS 81 74  BILITOT 0.8 0.6  PROT 8.4* 8.2*  ALBUMIN 3.9 4.0   No results for input(s): LIPASE, AMYLASE in the last 168 hours. No results for input(s): AMMONIA in the last 168 hours.  CBC: Recent Labs  Lab 10/18/19 1303 10/21/19 0325 10/22/19 0636 10/24/19 0434 10/25/19 0443  WBC 11.4* 8.0  9.1 7.4 7.5  NEUTROABS 8.8* 5.6  --   --   --   HGB 9.9* 9.8* 10.1* 9.1* 8.5*  HCT 32.2* 33.7* 32.5* 29.0* 28.0*  MCV 89.4 94.7 89.0 87.9 90.9  PLT 268 197 235 207 219    Cardiac Enzymes: No results for input(s): CKTOTAL, CKMB, CKMBINDEX, TROPONINI in the last 168 hours.  Lipid Panel: No results for input(s): CHOL, TRIG, HDL, CHOLHDL, VLDL, LDLCALC in the last 168 hours.  CBG: Recent Labs  Lab 10/23/19 2331 10/24/19 0449 10/24/19 1158 10/24/19 1746 10/25/19 0633  GLUCAP 116* 146* 197* 140* 138*    Microbiology: Results for orders placed or performed during the hospital encounter of 10/21/19  Respiratory Panel by RT PCR (Flu A&B, Covid) - Nasopharyngeal Swab     Status: None   Collection Time: 10/21/19  6:24 AM   Specimen: Nasopharyngeal Swab  Result Value Ref Range Status   SARS Coronavirus 2 by RT PCR NEGATIVE NEGATIVE Final    Comment: (NOTE) SARS-CoV-2 target nucleic acids  are NOT DETECTED. The SARS-CoV-2 RNA is generally detectable in upper respiratoy specimens during the acute phase of infection. The lowest concentration of SARS-CoV-2 viral copies this assay can detect is 131 copies/mL. A negative result does not preclude SARS-Cov-2 infection and should not be used as the sole basis for treatment or other patient management decisions. A negative result may occur with  improper specimen collection/handling, submission of specimen other than nasopharyngeal swab, presence of viral mutation(s) within the areas targeted by this assay, and inadequate number of viral copies (<131 copies/mL). A negative result must be combined with clinical observations, patient history, and epidemiological information. The expected result is Negative. Fact Sheet for Patients:  PinkCheek.be Fact Sheet for Healthcare Providers:  GravelBags.it This test is not yet ap proved or cleared by the [place] FDA and  has been  authorized for detection and/or diagnosis of SARS-CoV-2 by FDA under an Emergency Use Authorization (EUA). This EUA will remain  in effect (meaning this test can be used) for the duration of the COVID-19 declaration under Section 564(b)(1) of the Act, 21 U.S.C. section 360bbb-3(b)(1), unless the authorization is terminated or revoked sooner.    Influenza A by PCR NEGATIVE NEGATIVE Final   Influenza B by PCR NEGATIVE NEGATIVE Final    Comment: (NOTE) The Xpert Xpress SARS-CoV-2/FLU/RSV assay is intended as an aid in  the diagnosis of influenza from Nasopharyngeal swab specimens and  should not be used as a sole basis for treatment. Nasal washings and  aspirates are unacceptable for Xpert Xpress SARS-CoV-2/FLU/RSV  testing. Fact Sheet for Patients: PinkCheek.be Fact Sheet for Healthcare Providers: GravelBags.it This test is not yet approved or cleared by the [place] FDA and  has been authorized for detection and/or diagnosis of SARS-CoV-2 by  FDA under an Emergency Use Authorization (EUA). This EUA will remain  in effect (meaning this test can be used) for the duration of the  Covid-19 declaration under Section 564(b)(1) of the Act, 21  U.S.C. section 360bbb-3(b)(1), unless the authorization is  terminated or revoked. Performed at [facility], [address] [place], [place] [address]   Blood culture (routine x 2)     Status: None (Preliminary result)   Collection Time: 10/21/19  7:25 AM   Specimen: BLOOD  Result Value Ref Range Status   Specimen Description BLOOD RIGHT ARM  Final   Special Requests   Final    BOTTLES DRAWN AEROBIC AND ANAEROBIC Blood Culture adequate volume   Culture   Final    NO GROWTH 4 DAYS Performed at [facility], [address] [place], [place] [address]    Report Status PENDING  Incomplete  Blood culture (routine x 2)     Status: None (Preliminary result)    Collection Time: 10/21/19  7:45 AM   Specimen: BLOOD  Result Value Ref Range Status   Specimen Description BLOOD RIGHT HAND  Final   Special Requests   Final    BOTTLES DRAWN AEROBIC AND ANAEROBIC Blood Culture adequate volume   Culture   Final    NO GROWTH 4 DAYS Performed at [facility], [address], [place], [place] [address]    Report Status PENDING  Incomplete    Coagulation Studies: No results for input(s): LABPROT, INR in the last 72 hours.  Imaging: MR BRAIN WO CONTRAST  Result Date: 10/24/2019 CLINICAL DATA:  Altered mental status. End-stage renal disease. Recent coronavirus infection. EXAM: MRI HEAD WITHOUT CONTRAST TECHNIQUE: Multiplanar, multiecho pulse sequences of the  brain and surrounding structures were obtained without intravenous contrast. COMPARISON:  Head CT 10/18/2019 FINDINGS: Brain: The diffusion imaging does not show any acute or subacute infarction. No focal insult affects the brainstem. There are numerous old small vessel cerebellar infarctions. Cerebral hemispheres show old cortical and subcortical infarctions in both hemispheres affecting the frontal lobes and parietal lobes. There are confluent chronic small vessel ischemic changes of the hemispheric white matter. No mass lesion, hemorrhage, hydrocephalus or extra-axial collection. There is some hemosiderin deposition in the old infarctions, particularly the right frontal region. Vascular: Major vessels at the base of the brain show flow. Skull and upper cervical spine: Negative Sinuses/Orbits: Clear/normal Other: None IMPRESSION: No acute or subacute finding. 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 Chronic small-vessel ischemic changes of the cerebral hemispheric white matter. Old bilateral cortical and subcortical infarctions in the frontal and parietal regions. Electronically Signed   By: Nelson Chimes M.D.   On: 10/24/2019 21:17    Medications:  I have reviewed the patient's current  medications. Scheduled: . aspirin EC  325 mg Oral Daily  . atorvastatin  80 mg Oral QPM  . calcium acetate  2,001 mg Oral TID WC  . cephALEXin  500 mg Oral BID  . cholecalciferol  1,000 Units Oral Daily  . clopidogrel  75 mg Oral Daily  . dextromethorphan-guaiFENesin  1 tablet Oral BID  . docusate sodium  100 mg Oral BID  . fluticasone  1 spray Each Nare Daily  . gabapentin  100 mg Oral QHS  . Ipratropium-Albuterol  1 puff Inhalation Q6H  . levETIRAcetam  1,000 mg Oral Daily  . losartan  50 mg Oral Daily  . mouth rinse  15 mL Mouth Rinse BID  . metoprolol succinate  50 mg Oral Daily  . pantoprazole  40 mg Oral Daily  . torsemide  100 mg Oral Daily    Assessment/Plan: 69 year old female with medical history significant ofESRD-HD (MWF), hypertension, hyperlipidemia, COPD on 4 L oxygen, stroke, GERD, COVID-19 infection (07/30/2019), GI bleeding, CAD, carotid artery stenosis, seizure and delirium who presented with unresponsiveness and hypoglycemia.  Patient also with BUN/Cr worse than at baseline due to missed HD sessions.  Patient has improved since presentation but is still not back to baseline.  Has had delirium with hospitalizations in the past but with the significant hypoglycemia documented for an unknown period of time, unclear if there may be cerebral hypoglycemic injury.   Missed HD sessions may be complicating the picture as well.  Doubt subclinical seizure activity at this time. MRI of the brain reviewed and shows no acute changes but damage at the cellular level from hypoglycemia may not be apparent on MRI.  Unclear if mental status will improve further significantly.  Would continue to make sure metabolic issues are addressed and have patient remain on Keppra as patient is given more time to improve.  Will likely require full time supervision.    No further neurologic intervention is recommended at this time.  If further questions arise, please call or page at that time.  Thank you  for allowing neurology to participate in the care of this patient.    LOS: 3 days   Alexis Goodell, MD Neurology [phone] 10/25/2019  9:09 AM

## 2019-10-26 LAB — CULTURE, BLOOD (ROUTINE X 2)
Culture: NO GROWTH
Culture: NO GROWTH
Special Requests: ADEQUATE
Special Requests: ADEQUATE

## 2019-11-04 ENCOUNTER — Ambulatory Visit (INDEPENDENT_AMBULATORY_CARE_PROVIDER_SITE_OTHER): Payer: Medicare HMO | Admitting: *Deleted

## 2019-11-04 DIAGNOSIS — I6310 Cerebral infarction due to embolism of unspecified precerebral artery: Secondary | ICD-10-CM | POA: Diagnosis not present

## 2019-11-04 LAB — CUP PACEART REMOTE DEVICE CHECK
Date Time Interrogation Session: 20210411033828
Implantable Pulse Generator Implant Date: 20190425

## 2019-11-05 NOTE — Progress Notes (Signed)
ILR Remote 

## 2019-11-11 ENCOUNTER — Telehealth (INDEPENDENT_AMBULATORY_CARE_PROVIDER_SITE_OTHER): Payer: Self-pay

## 2019-11-11 NOTE — Telephone Encounter (Signed)
A fax was received from [facility] for a LUE shuntogram. Patient is now scheduled with Dr. Lucky Cowboy for a LUE shuntogram on 11/15/19 with a 12:30 pm arrival time to the [facility]. Patient will do covid testing on 11/13/19 between 8-1 pm at the [facility]. Pre-procedure instructions will be faxed to [employer].

## 2019-11-12 ENCOUNTER — Telehealth (INDEPENDENT_AMBULATORY_CARE_PROVIDER_SITE_OTHER): Payer: Self-pay

## 2019-11-12 NOTE — Telephone Encounter (Signed)
Patient doesn't have an appt with [place] 11/14/19.

## 2019-11-12 NOTE — Telephone Encounter (Signed)
This pt has a procedure with Dr. Lucky Cowboy and pre admit testing coming up that they want to reschedule to the 30th to when her husband can take her.

## 2019-11-12 NOTE — Telephone Encounter (Signed)
Mark from [facility] called and said the pt would like to change her appointment from this up coming Thursday 4/22  to the 36 th so her husband can bring her into her appointment.

## 2019-11-13 ENCOUNTER — Other Ambulatory Visit: Admission: RE | Admit: 2019-11-13 | Payer: Medicare HMO | Source: Ambulatory Visit

## 2019-11-14 ENCOUNTER — Other Ambulatory Visit (INDEPENDENT_AMBULATORY_CARE_PROVIDER_SITE_OTHER): Payer: Self-pay | Admitting: Nurse Practitioner

## 2019-11-14 NOTE — Telephone Encounter (Signed)
Patient has been rescheduled to 11/21/19 for a LUE fistulagram with Dr. Lucky Cowboy.

## 2019-11-15 DIAGNOSIS — N186 End stage renal disease: Secondary | ICD-10-CM

## 2019-11-19 ENCOUNTER — Other Ambulatory Visit: Admission: RE | Admit: 2019-11-19 | Payer: Medicare HMO | Source: Ambulatory Visit

## 2019-11-20 ENCOUNTER — Other Ambulatory Visit
Admission: RE | Admit: 2019-11-20 | Discharge: 2019-11-20 | Disposition: A | Discharge status: Home / Self Care | Payer: Medicare HMO | Source: Ambulatory Visit | Attending: Vascular Surgery | Admitting: Vascular Surgery

## 2019-11-20 ENCOUNTER — Other Ambulatory Visit: Payer: Self-pay

## 2019-11-20 ENCOUNTER — Other Ambulatory Visit (INDEPENDENT_AMBULATORY_CARE_PROVIDER_SITE_OTHER): Payer: Self-pay | Admitting: Nurse Practitioner

## 2019-11-20 DIAGNOSIS — Z20822 Contact with and (suspected) exposure to covid-19: Secondary | ICD-10-CM | POA: Diagnosis not present

## 2019-11-20 DIAGNOSIS — Z01812 Encounter for preprocedural laboratory examination: Secondary | ICD-10-CM | POA: Diagnosis present

## 2019-11-20 LAB — SARS CORONAVIRUS 2 (TAT 6-24 HRS): SARS Coronavirus 2: NEGATIVE

## 2019-11-21 ENCOUNTER — Encounter: Payer: Self-pay | Admitting: Vascular Surgery

## 2019-11-21 ENCOUNTER — Other Ambulatory Visit: Payer: Self-pay

## 2019-11-21 ENCOUNTER — Ambulatory Visit
Admission: RE | Admit: 2019-11-21 | Discharge: 2019-11-21 | Disposition: A | Discharge status: Home / Self Care | Payer: Medicare HMO | Attending: Vascular Surgery | Admitting: Vascular Surgery

## 2019-11-21 ENCOUNTER — Encounter
Admission: RE | Disposition: A | Discharge status: Home / Self Care | Payer: Self-pay | Source: Home / Self Care | Attending: Vascular Surgery

## 2019-11-21 DIAGNOSIS — I2510 Atherosclerotic heart disease of native coronary artery without angina pectoris: Secondary | ICD-10-CM | POA: Insufficient documentation

## 2019-11-21 DIAGNOSIS — Z87891 Personal history of nicotine dependence: Secondary | ICD-10-CM | POA: Diagnosis not present

## 2019-11-21 DIAGNOSIS — N186 End stage renal disease: Secondary | ICD-10-CM | POA: Insufficient documentation

## 2019-11-21 DIAGNOSIS — Z8249 Family history of ischemic heart disease and other diseases of the circulatory system: Secondary | ICD-10-CM | POA: Insufficient documentation

## 2019-11-21 DIAGNOSIS — I6523 Occlusion and stenosis of bilateral carotid arteries: Secondary | ICD-10-CM | POA: Diagnosis not present

## 2019-11-21 DIAGNOSIS — E11649 Type 2 diabetes mellitus with hypoglycemia without coma: Secondary | ICD-10-CM | POA: Diagnosis not present

## 2019-11-21 DIAGNOSIS — T82898A Other specified complication of vascular prosthetic devices, implants and grafts, initial encounter: Secondary | ICD-10-CM | POA: Diagnosis not present

## 2019-11-21 DIAGNOSIS — Y832 Surgical operation with anastomosis, bypass or graft as the cause of abnormal reaction of the patient, or of later complication, without mention of misadventure at the time of the procedure: Secondary | ICD-10-CM | POA: Insufficient documentation

## 2019-11-21 DIAGNOSIS — I120 Hypertensive chronic kidney disease with stage 5 chronic kidney disease or end stage renal disease: Secondary | ICD-10-CM | POA: Diagnosis not present

## 2019-11-21 DIAGNOSIS — Z992 Dependence on renal dialysis: Secondary | ICD-10-CM | POA: Diagnosis not present

## 2019-11-21 DIAGNOSIS — T82858A Stenosis of vascular prosthetic devices, implants and grafts, initial encounter: Secondary | ICD-10-CM | POA: Diagnosis not present

## 2019-11-21 DIAGNOSIS — Z7902 Long term (current) use of antithrombotics/antiplatelets: Secondary | ICD-10-CM | POA: Insufficient documentation

## 2019-11-21 DIAGNOSIS — E785 Hyperlipidemia, unspecified: Secondary | ICD-10-CM | POA: Insufficient documentation

## 2019-11-21 DIAGNOSIS — Z8673 Personal history of transient ischemic attack (TIA), and cerebral infarction without residual deficits: Secondary | ICD-10-CM | POA: Insufficient documentation

## 2019-11-21 DIAGNOSIS — E1122 Type 2 diabetes mellitus with diabetic chronic kidney disease: Secondary | ICD-10-CM | POA: Diagnosis not present

## 2019-11-21 DIAGNOSIS — Z885 Allergy status to narcotic agent status: Secondary | ICD-10-CM | POA: Insufficient documentation

## 2019-11-21 DIAGNOSIS — Z7982 Long term (current) use of aspirin: Secondary | ICD-10-CM | POA: Diagnosis not present

## 2019-11-21 DIAGNOSIS — Z823 Family history of stroke: Secondary | ICD-10-CM | POA: Insufficient documentation

## 2019-11-21 DIAGNOSIS — J449 Chronic obstructive pulmonary disease, unspecified: Secondary | ICD-10-CM | POA: Insufficient documentation

## 2019-11-21 DIAGNOSIS — Z79899 Other long term (current) drug therapy: Secondary | ICD-10-CM | POA: Insufficient documentation

## 2019-11-21 DIAGNOSIS — Z8616 Personal history of COVID-19: Secondary | ICD-10-CM | POA: Diagnosis not present

## 2019-11-21 DIAGNOSIS — Z888 Allergy status to other drugs, medicaments and biological substances status: Secondary | ICD-10-CM | POA: Diagnosis not present

## 2019-11-21 HISTORY — PX: A/V SHUNTOGRAM: CATH118297

## 2019-11-21 LAB — POTASSIUM (ARMC VASCULAR LAB ONLY): Potassium (ARMC vascular lab): 5 (ref 3.5–5.1)

## 2019-11-21 LAB — GLUCOSE, CAPILLARY
Glucose-Capillary: 152 mg/dL — ABNORMAL HIGH (ref 70–99)
Glucose-Capillary: 141 mg/dL — ABNORMAL HIGH (ref 70–99)

## 2019-11-21 SURGERY — A/V SHUNTOGRAM
Anesthesia: Moderate Sedation | Laterality: Left

## 2019-11-21 MED ORDER — MIDAZOLAM HCL 5 MG/5ML IJ SOLN
INTRAMUSCULAR | Status: AC
  Filled 2019-11-21: qty 5

## 2019-11-21 MED ORDER — HEPARIN SODIUM (PORCINE) 1000 UNIT/ML IJ SOLN
INTRAMUSCULAR | Status: DC | PRN
  Administered 2019-11-21: 3000 [IU] via INTRAVENOUS

## 2019-11-21 MED ORDER — CEFAZOLIN SODIUM-DEXTROSE 1-4 GM/50ML-% IV SOLN
1.0000 g | Freq: Once | INTRAVENOUS | Status: AC
  Administered 2019-11-21: 12:00:00 1 g via INTRAVENOUS

## 2019-11-21 MED ORDER — CEFAZOLIN SODIUM-DEXTROSE 1-4 GM/50ML-% IV SOLN
INTRAVENOUS | Status: AC
  Filled 2019-11-21: qty 50

## 2019-11-21 MED ORDER — METHYLPREDNISOLONE SODIUM SUCC 125 MG IJ SOLR: 125.0000 mg | Freq: Once | INTRAMUSCULAR | Status: DC | PRN

## 2019-11-21 MED ORDER — FENTANYL CITRATE (PF) 100 MCG/2ML IJ SOLN
INTRAMUSCULAR | Status: AC
  Filled 2019-11-21: qty 2

## 2019-11-21 MED ORDER — MIDAZOLAM HCL 2 MG/ML PO SYRP: 8.0000 mg | ORAL_SOLUTION | Freq: Once | ORAL | Status: DC | PRN

## 2019-11-21 MED ORDER — HEPARIN SODIUM (PORCINE) 1000 UNIT/ML IJ SOLN
INTRAMUSCULAR | Status: AC
  Filled 2019-11-21: qty 1

## 2019-11-21 MED ORDER — FAMOTIDINE 20 MG PO TABS: 40.0000 mg | ORAL_TABLET | Freq: Once | ORAL | Status: DC | PRN

## 2019-11-21 MED ORDER — SODIUM CHLORIDE 0.9 % IV SOLN: INTRAVENOUS | Status: DC

## 2019-11-21 MED ORDER — HYDROMORPHONE HCL 1 MG/ML IJ SOLN: 1.0000 mg | Freq: Once | INTRAMUSCULAR | Status: DC | PRN

## 2019-11-21 MED ORDER — DIPHENHYDRAMINE HCL 50 MG/ML IJ SOLN: 50.0000 mg | Freq: Once | INTRAMUSCULAR | Status: DC | PRN

## 2019-11-21 MED ORDER — ONDANSETRON HCL 4 MG/2ML IJ SOLN: 4.0000 mg | Freq: Four times a day (QID) | INTRAMUSCULAR | Status: DC | PRN

## 2019-11-21 MED ORDER — FENTANYL CITRATE (PF) 100 MCG/2ML IJ SOLN
INTRAMUSCULAR | Status: DC | PRN
  Administered 2019-11-21: 50 ug via INTRAVENOUS

## 2019-11-21 MED ORDER — MIDAZOLAM HCL 2 MG/2ML IJ SOLN
INTRAMUSCULAR | Status: DC | PRN
  Administered 2019-11-21: 2 mg via INTRAVENOUS

## 2019-11-21 SURGICAL SUPPLY — 10 items
BALLN DORADO7X100X80 (BALLOONS) ×2
BALLOON DORADO7X100X80 (BALLOONS) ×1 IMPLANT
CANNULA 5F STIFF (CANNULA) ×2 IMPLANT
CATH BEACON 5 .035 40 KMP TP (CATHETERS) ×1 IMPLANT
CATH BEACON 5 .038 40 KMP TP (CATHETERS) ×1
DEVICE PRESTO INFLATION (MISCELLANEOUS) ×2 IMPLANT
PACK ANGIOGRAPHY (CUSTOM PROCEDURE TRAY) ×2 IMPLANT
SHEATH BRITE TIP 6FRX5.5 (SHEATH) ×2 IMPLANT
SUT MNCRL AB 4-0 PS2 18 (SUTURE) ×2 IMPLANT
WIRE MAGIC TOR.035 180C (WIRE) ×2 IMPLANT

## 2019-11-21 NOTE — H&P (Signed)
 [facility] History & Physical Update  The patient was interviewed and re-examined.  The patient's previous History and Physical has been reviewed and is unchanged.  There is no change in the plan of care. We plan to proceed with the scheduled procedure.  Leotis Pain, MD  11/21/2019, 10:35 AM

## 2019-11-21 NOTE — Op Note (Signed)
[facility]    OPERATIVE NOTE   PROCEDURE: 1.  Left brachial artery to axillary vein arteriovenous graft cannulation under ultrasound guidance 2.  Left arm shuntogram 3.  Percutaneous transluminal angioplasty of the mid and distal graft and the venous anastomosis with 3 inflations with a 7 mm diameter by 10 cm length high-pressure angioplasty balloon  PRE-OPERATIVE DIAGNOSIS: 1. ESRD 2. Malfunctioning left brachial artery to axillary vein arteriovenous graft  POST-OPERATIVE DIAGNOSIS: same as above   SURGEON: Leotis Pain, MD  ANESTHESIA: local with MCS  ESTIMATED BLOOD LOSS: 3 cc  FINDING(S): 1. Multiple areas of endograft stenosis mostly within and just proximal to the previously placed stents starting at the arterial access site and going all the way to the venous access site.  These ranged from moderate in the 50 to 60% range to a very high-grade lesion at the arterial access site in the 90% range.  The graft then normalized until the venous anastomosis where there was about an 80% stenosis.  The remainder of the central venous circulation was then widely patent.  SPECIMEN(S):  None  CONTRAST: 25 cc  FLUORO TIME: 2.6 minutes  MODERATE CONSCIOUS SEDATION TIME:  Approximately 20 minutes using 2 mg of Versed and 50 mcg of Fentanyl  INDICATIONS: Cynthia Dean is a 69 y.o. female who presents with malfunctioning left brachial artery to axillary vein arteriovenous graft.  The patient is scheduled for left arm shuntogram.  The patient is aware the risks include but are not limited to: bleeding, infection, thrombosis of the cannulated access, and possible anaphylactic reaction to the contrast.  The patient is aware of the risks of the procedure and elects to proceed forward.  DESCRIPTION: After full informed written consent was obtained, the patient was brought back to the angiography suite and placed supine upon the angiography table.  The patient was  connected to monitoring equipment. Moderate conscious sedation was administered during a face to face encounter throughout the procedure with my supervision of the RN administering medicines and monitoring the patient's vital signs, pulse oximetry, telemetry and mental status throughout from the start of the procedure until the patient was taken to the recovery room The left arm was prepped and draped in the standard fashion for a percutaneous access intervention.  Under ultrasound guidance, the left brachial artery to axillary vein arteriovenous graft was cannulated with a micropuncture needle under direct ultrasound guidance were it was patent and a permanent image was performed.  The microwire was advanced into the graft and the needle was exchanged for the a microsheath.  I then upsized to a 6 Fr Sheath and imaging was performed.  Hand injections were completed to image the access including the central venous system. This demonstrated multiple areas of endograft stenosis mostly within and just proximal to the previously placed stents starting at the arterial access site and going all the way to the venous access site.  These ranged from moderate in the 50 to 60% range to a very high-grade lesion at the arterial access site in the 90% range.  The graft then normalized until the venous anastomosis where there was about an 80% stenosis.  The remainder of the central venous circulation was then widely patent..  Based on the images, this patient will need treatment to multiple areas of stenosis to salvage the graft. I then gave the patient 3000 units of intravenous heparin.  I then crossed the stenoses with a Magic Tourqe wire.  Based on the  imaging, a 7 mm x 10 cm high-pressure angioplasty balloon was selected.  The balloon was centered around the arterial access site and mid graft stenosis and inflated to 28 ATM for 1 minute(s).  It was then advanced to the mid to distal graft and inflated to 18 atm for 1 minute.   It was then advanced to the venous anastomosis inflated to 26 atm for 1 minute on completion imaging, a 15-20% residual stenosis was present at the venous anastomosis and no greater than 25% residual stenosis was seen within the graft.     Based on the completion imaging, no further intervention is necessary.  The wire and balloon were removed from the sheath.  A 4-0 Monocryl purse-string suture was sewn around the sheath.  The sheath was removed while tying down the suture.  A sterile bandage was applied to the puncture site.  COMPLICATIONS: None  CONDITION: Stable   Leotis Pain  11/21/2019 12:50 PM    This note was created with Dragon Medical transcription system. Any errors in dictation are purely unintentional.

## 2019-11-22 ENCOUNTER — Encounter: Payer: Self-pay | Admitting: Cardiology

## 2019-11-27 NOTE — H&P (Signed)
[facility] Admission History & Physical  MRN : [id number]  Cynthia Dean is a 69 y.o. (1950/11/17) female who presents with chief complaint of No chief complaint on file. [name]  History of Present Illness: I am asked to evaluate the patient by the dialysis center. The patient was sent here because they were unable to achieve adequate dialysis this morning. Furthermore the Center states there is very poor thrill and bruit. The patient states there there have been increasing problems with the access, such as "pulling clots" during dialysis and prolonged bleeding after decannulation. The patient estimates these problems have been going on for several weeks. The patient is unaware of any other change.  Patient denies pain or tenderness overlying the access.  There is no pain with dialysis.  The patient denies hand pain or finger pain consistent with steal syndrome.   There have been many past interventions or declots of this access.  The patient is not chronically hypotensive on dialysis.  No current facility-administered medications for this encounter.   Current Outpatient Medications  Medication Sig Dispense Refill  . aspirin EC 325 MG tablet Take 325 mg by mouth daily.    [name] atorvastatin (LIPITOR) 80 MG tablet Take 80 mg by mouth every evening.     . cholecalciferol (VITAMIN D) 25 MCG tablet Take 1 tablet (1,000 Units total) by mouth daily.    [name] docusate sodium (COLACE) 100 MG capsule Take 1 capsule (100 mg total) by mouth 2 (two) times daily. 60 capsule 2  . fluticasone (FLONASE) 50 MCG/ACT nasal spray 2 sprays by Each Nare route daily. Prior to CPAP    . gabapentin (NEURONTIN) 100 MG capsule Take 1 capsule (100 mg total) by mouth at bedtime.    . hydrocortisone (ANUSOL-HC) 2.5 % rectal cream Place 1 application rectally 2 (two) times daily. (Patient taking differently: Place 1 application rectally 2 (two) times daily as needed. ) 30 g 1  .  Ipratropium-Albuterol (COMBIVENT) 20-100 MCG/ACT AERS respimat Inhale 1 puff into the lungs every 6 (six) hours. 4 g 0  . levETIRAcetam (KEPPRA) 1000 MG tablet Take 1 tablet (1,000 mg total) by mouth daily. 30 tablet 0  . losartan (COZAAR) 100 MG tablet Take 100 mg by mouth daily. Takes in the morning    . metoprolol succinate (TOPROL-XL) 100 MG 24 hr tablet Take 100 mg by mouth daily. Take with or immediately following a meal.     . omeprazole (PRILOSEC) 20 MG capsule Take 20 mg by mouth 2 (two) times daily.     . calcium acetate (PHOSLO) 667 MG capsule Take 3 capsules (2,001 mg total) by mouth 3 (three) times daily with meals.    . clopidogrel (PLAVIX) 75 MG tablet TAKE ONE TABLET BY MOUTH ONCE DAILY (Patient taking differently: Take 75 mg by mouth daily. ) 30 tablet 5  . lidocaine-prilocaine (EMLA) cream Apply 1 application topically as needed (for port access).     . torsemide (DEMADEX) 100 MG tablet Take 100 mg by mouth daily.      Past Medical History:  Diagnosis Date  . Acute on chronic respiratory failure with hypoxia ([place]) 07/30/2019  . Acute respiratory failure with hypoxia ([place]) 12/31/2018  . Carotid arterial disease ([place])    a. 02/2018 < bilat ICA stenosis.  [name] COPD (chronic obstructive pulmonary disease) ([place])   . Coronary artery disease    a. 11/2015 MV: EF 57%, no ischemia/infarct.  . Cryptogenic stroke ([place])    a.  10/2017 s/p implantable loop recorder (no Afib to date).  . Diabetes mellitus without complication ([place])   . Diarrhea   . ESRD on dialysis ([place])   . GI bleed    a. 01/2019 EGD/Colonoscopy: Mild gastritis. Colitis.  [name] Heart murmur    a. 12/2018 Echo: EF 55-60%, impaired relaxation. Mod dil LA. Mildly dil RA. Mild Ca2+ of AoV.  [name] Hematemesis 06/27/2017  . History of 2019 novel coronavirus disease (COVID-19) 07/30/2019  . Hyperkalemia   . Hyperlipidemia   . Hypertension   . Intractable nausea and vomiting 08/08/2015  . Nausea & vomiting 05/30/2018  . Nausea and vomiting  05/29/2018  . Pneumonia due to COVID-19 virus     Past Surgical History:  Procedure Laterality Date  . A/V FISTULAGRAM Left 12/20/2016   Procedure: A/V Fistulagram;  Surgeon: Katha Cabal, MD;  Location: [facility];  Service: Cardiovascular;  Laterality: Left;  . A/V SHUNT INTERVENTION N/A 12/20/2016   Procedure: A/V Shunt Intervention;  Surgeon: Katha Cabal, MD;  Location: [facility];  Service: Cardiovascular;  Laterality: N/A;  . A/V SHUNTOGRAM Left 09/11/2017   Procedure: A/V SHUNTOGRAM;  Surgeon: Algernon Huxley, MD;  Location: [facility];  Service: Cardiovascular;  Laterality: Left;  . A/V SHUNTOGRAM Left 11/21/2019   Procedure: A/V SHUNTOGRAM;  Surgeon: Algernon Huxley, MD;  Location: [facility];  Service: Cardiovascular;  Laterality: Left;  . BREAST BIOPSY Bilateral 07/19/00   neg  . COLONOSCOPY WITH PROPOFOL N/A 02/16/2019   Procedure: COLONOSCOPY WITH PROPOFOL;  Surgeon: Toledo, Benay Pike, MD;  Location: [facility];  Service: Gastroenterology;  Laterality: N/A;  . ESOPHAGOGASTRODUODENOSCOPY (EGD) WITH PROPOFOL N/A 02/16/2019   Procedure: ESOPHAGOGASTRODUODENOSCOPY (EGD) WITH PROPOFOL;  Surgeon: Toledo, Benay Pike, MD;  Location: [facility];  Service: Gastroenterology;  Laterality: N/A;  . LOOP RECORDER INSERTION N/A 11/16/2017   Procedure: LOOP RECORDER INSERTION;  Surgeon: Deboraha Sprang, MD;  Location: [facility];  Service: Cardiovascular;  Laterality: N/A;  . PERIPHERAL VASCULAR CATHETERIZATION Left 02/02/2015   Procedure: A/V Shuntogram/Fistulagram;  Surgeon: Algernon Huxley, MD;  Location: [facility];  Service: Cardiovascular;  Laterality: Left;  . PERIPHERAL VASCULAR CATHETERIZATION Left 02/02/2015   Procedure: A/V Shunt Intervention;  Surgeon: Algernon Huxley, MD;  Location: [facility];  Service: Cardiovascular;  Laterality: Left;  . PERIPHERAL VASCULAR CATHETERIZATION Left 03/09/2015   Procedure: A/V  Shuntogram/Fistulagram;  Surgeon: Algernon Huxley, MD;  Location: [facility];  Service: Cardiovascular;  Laterality: Left;  . PERIPHERAL VASCULAR CATHETERIZATION N/A 03/09/2015   Procedure: A/V Shunt Intervention;  Surgeon: Algernon Huxley, MD;  Location: [facility];  Service: Cardiovascular;  Laterality: N/A;  . PERIPHERAL VASCULAR CATHETERIZATION Left 04/17/2015   Procedure: Upper Extremity Angiography;  Surgeon: Algernon Huxley, MD;  Location: [facility];  Service: Cardiovascular;  Laterality: Left;  . PERIPHERAL VASCULAR CATHETERIZATION  04/17/2015   Procedure: Upper Extremity Intervention;  Surgeon: Algernon Huxley, MD;  Location: [facility];  Service: Cardiovascular;;  . PERIPHERAL VASCULAR CATHETERIZATION N/A 08/10/2015   Procedure: A/V Shuntogram/Fistulagram;  Surgeon: Algernon Huxley, MD;  Location: [facility];  Service: Cardiovascular;  Laterality: N/A;  . PERIPHERAL VASCULAR CATHETERIZATION N/A 08/10/2015   Procedure: A/V Shunt Intervention;  Surgeon: Algernon Huxley, MD;  Location: [facility];  Service: Cardiovascular;  Laterality: N/A;  . PERIPHERAL VASCULAR CATHETERIZATION Left 04/11/2016   Procedure:  A/V Shuntogram/Fistulagram;  Surgeon: Algernon Huxley, MD;  Location: [facility];  Service: Cardiovascular;  Laterality: Left;  . TEE WITHOUT CARDIOVERSION N/A 11/16/2017   Procedure: TRANSESOPHAGEAL ECHOCARDIOGRAM (TEE);  Surgeon: Minna Merritts, MD;  Location: [facility];  Service: Cardiovascular;  Laterality: N/A;     Social History   Tobacco Use  . Smoking status: Former Smoker    Packs/day: 0.00    Types: Cigarettes  . Smokeless tobacco: Never Used  Substance Use Topics  . Alcohol use: No  . Drug use: No     Family History  Problem Relation Age of Onset  . Breast cancer Father 21  . Stroke Mother   . Heart attack Mother   . Heart Problems Sister   . Breast cancer Cousin 73       1 st cousin. Maternal     No family history of  bleeding or clotting disorders, autoimmune disease or porphyria  Allergies  Allergen Reactions  . Hydrocodone     Intolerant more than allergic  . Tape Itching    Skin Dermatitis/itching (tape adhesive) Skin Dermatitis/itching (tape adhesive)  . Tapentadol Itching    Skin Dermatitis/itching (tape adhesive) Skin Dermatitis/itching (tape adhesive)      REVIEW OF SYSTEMS (Negative unless checked)  Constitutional: [] Weight loss  [] Fever  [] Chills Cardiac: [] Chest pain   [] Chest pressure   [] Palpitations   [] Shortness of breath when laying flat   [] Shortness of breath at rest   [x] Shortness of breath with exertion. Vascular:  [] Pain in legs with walking   [] Pain in legs at rest   [] Pain in legs when laying flat   [] Claudication   [] Pain in feet when walking  [] Pain in feet at rest  [] Pain in feet when laying flat   [] History of DVT   [] Phlebitis   [] Swelling in legs   [] Varicose veins   [] Non-healing ulcers Pulmonary:   [] Uses home oxygen   [] Productive cough   [] Hemoptysis   [] Wheeze  [] COPD   [] Asthma Neurologic:  [] Dizziness  [] Blackouts   [] Seizures   [] History of stroke   [] History of TIA  [] Aphasia   [] Temporary blindness   [] Dysphagia   [] Weakness or numbness in arms   [] Weakness or numbness in legs Musculoskeletal:  [] Arthritis   [] Joint swelling   [] Joint pain   [] Low back pain Hematologic:  [] Easy bruising  [] Easy bleeding   [] Hypercoagulable state   [] Anemic  [] Hepatitis Gastrointestinal:  [] Blood in stool   [] Vomiting blood  [] Gastroesophageal reflux/heartburn   [] Difficulty swallowing. Genitourinary:  [x] Chronic kidney disease   [] Difficult urination  [] Frequent urination  [] Burning with urination   [] Blood in urine Skin:  [] Rashes   [] Ulcers   [] Wounds Psychological:  [] History of anxiety   []  History of major depression.  Physical Examination  Vitals:   11/21/19 1257 11/21/19 1300 11/21/19 1315 11/21/19 1330  BP: (!) 159/66 (!) 168/72 (!) 165/62 (!) 185/66  Pulse: 60 62     Resp: 20 (!) 21 17   Temp:      TempSrc:      SpO2: (!) 89% 91%  90%  Weight:      Height:       Body mass index is 44.28 kg/m. Gen: WD/WN, NAD Head: [place]/AT, No temporalis wasting. Ear/Nose/Throat: Hearing grossly intact, nares w/o erythema or drainage, oropharynx w/o Erythema/Exudate,  Eyes: Conjunctiva clear, sclera non-icteric Neck: Trachea midline.  No JVD.  Pulmonary:  Good air movement, respirations not labored, no use of  accessory muscles.  Cardiac: RRR, normal S1, S2. Vascular: left arm AVG pulsatile Vessel Right Left  Radial Palpable Palpable   Musculoskeletal: M/S 5/5 throughout.  Extremities without ischemic changes.  No deformity or atrophy.  Neurologic: Sensation grossly intact in extremities.  Symmetrical.  Speech is fluent. Motor exam as listed above. Psychiatric: Judgment intact, Mood & affect appropriate for pt's clinical situation. Dermatologic: No rashes or ulcers noted.  No cellulitis or open wounds.    CBC Lab Results  Component Value Date   WBC 7.5 10/25/2019   HGB 8.5 (L) 10/25/2019   HCT 28.0 (L) 10/25/2019   MCV 90.9 10/25/2019   PLT 219 10/25/2019    BMET    Component Value Date/Time   NA 138 10/25/2019 0443   NA 135 (L) 06/16/2014 0915   K 4.5 10/25/2019 0443   K 4.8 06/16/2014 0915   CL 94 (L) 10/25/2019 0443   CL 100 06/16/2014 0915   CO2 27 10/25/2019 0443   CO2 22 06/16/2014 0915   GLUCOSE 139 (H) 10/25/2019 0443   GLUCOSE 226 (H) 06/16/2014 0915   BUN 55 (H) 10/25/2019 0443   BUN 47 (H) 06/16/2014 0915   CREATININE 11.03 (H) 10/25/2019 0443   CREATININE 7.60 (H) 06/16/2014 0915   CALCIUM 9.4 10/25/2019 0443   CALCIUM 7.2 (L) 06/16/2014 0915   GFRNONAA 3 (L) 10/25/2019 0443   GFRNONAA 6 (L) 06/16/2014 0915   GFRNONAA 6 (L) 04/07/2014 0918   GFRAA 4 (L) 10/25/2019 0443   GFRAA 7 (L) 06/16/2014 0915   GFRAA 7 (L) 04/07/2014 0918   CrCl cannot be calculated (Patient's most recent lab result is older than the maximum 21 days  allowed.).  COAG Lab Results  Component Value Date   INR 1.1 07/30/2019   INR 1.04 03/13/2018   INR 0.85 11/14/2017    Radiology PERIPHERAL VASCULAR CATHETERIZATION  Result Date: 11/21/2019 See op note  CUP PACEART REMOTE DEVICE CHECK  Result Date: 11/04/2019 Care[facility] summary report received. Battery status OK. Normal device function. No new symptom episodes, tachy episodes, brady, or pause episodes. No new AF episodes. Monthly summary reports and ROV/PRN [facility]   Assessment/Plan 1.  Complication dialysis device with poorly functioning AV access:  Patient's dialysis access is malfunctioning. The patient will undergo angiography and correction of any problems using interventional techniques with the hope of restoring function to the access.  The risks and benefits were described to the patient.  All questions were answered.  The patient agrees to proceed with angiography and intervention. Potassium will be drawn to ensure that it is an appropriate level prior to performing intervention. 2.  End-stage renal disease requiring hemodialysis:  Patient will continue dialysis therapy without further interruption if a successful intervention is not achieved then a tunneled catheter will be placed. Dialysis has already been arranged. 3.  Hypertension:  Patient will continue medical management; nephrology is following no changes in oral medications. 4. Diabetes mellitus:  Glucose will be monitored and oral medications been held this morning once the patient has undergone the patient's procedure po intake will be reinitiated and again Accu-Cheks will be used to assess the blood glucose level and treat as needed. The patient will be restarted on the patient's usual hypoglycemic regime 5.  Coronary artery disease:  EKG will be monitored. Nitrates will be used if needed. The patient's oral cardiac medications will be continued.    Leotis Pain, MD  11/27/2019 4:21 PM

## 2019-12-05 LAB — CUP PACEART REMOTE DEVICE CHECK
Date Time Interrogation Session: 20210512034045
Implantable Pulse Generator Implant Date: 20190425

## 2019-12-09 ENCOUNTER — Ambulatory Visit (INDEPENDENT_AMBULATORY_CARE_PROVIDER_SITE_OTHER): Payer: Medicare HMO | Admitting: *Deleted

## 2019-12-09 DIAGNOSIS — I6310 Cerebral infarction due to embolism of unspecified precerebral artery: Secondary | ICD-10-CM | POA: Diagnosis not present

## 2019-12-10 NOTE — Progress Notes (Signed)
[facility]link Summary Report / Loop Recorder 

## 2020-01-13 ENCOUNTER — Ambulatory Visit (INDEPENDENT_AMBULATORY_CARE_PROVIDER_SITE_OTHER): Payer: Medicare HMO | Admitting: *Deleted

## 2020-01-13 DIAGNOSIS — I6310 Cerebral infarction due to embolism of unspecified precerebral artery: Secondary | ICD-10-CM | POA: Diagnosis not present

## 2020-01-13 LAB — CUP PACEART REMOTE DEVICE CHECK
Date Time Interrogation Session: 20210621001801
Implantable Pulse Generator Implant Date: 20190425

## 2020-01-14 NOTE — Progress Notes (Signed)
[facility]link Summary Report / Loop Recorder 

## 2020-01-29 ENCOUNTER — Observation Stay
Admission: EM | Admit: 2020-01-29 | Discharge: 2020-01-30 | Disposition: A | Discharge status: Home / Self Care | Payer: Medicare HMO | Attending: Internal Medicine | Admitting: Internal Medicine

## 2020-01-29 ENCOUNTER — Emergency Department: Payer: Medicare HMO

## 2020-01-29 ENCOUNTER — Other Ambulatory Visit: Payer: Self-pay

## 2020-01-29 DIAGNOSIS — I132 Hypertensive heart and chronic kidney disease with heart failure and with stage 5 chronic kidney disease, or end stage renal disease: Secondary | ICD-10-CM | POA: Insufficient documentation

## 2020-01-29 DIAGNOSIS — J9601 Acute respiratory failure with hypoxia: Secondary | ICD-10-CM | POA: Diagnosis not present

## 2020-01-29 DIAGNOSIS — Z7984 Long term (current) use of oral hypoglycemic drugs: Secondary | ICD-10-CM | POA: Diagnosis not present

## 2020-01-29 DIAGNOSIS — I5033 Acute on chronic diastolic (congestive) heart failure: Secondary | ICD-10-CM | POA: Diagnosis present

## 2020-01-29 DIAGNOSIS — E1122 Type 2 diabetes mellitus with diabetic chronic kidney disease: Secondary | ICD-10-CM | POA: Diagnosis not present

## 2020-01-29 DIAGNOSIS — I69351 Hemiplegia and hemiparesis following cerebral infarction affecting right dominant side: Secondary | ICD-10-CM | POA: Diagnosis not present

## 2020-01-29 DIAGNOSIS — I5030 Unspecified diastolic (congestive) heart failure: Secondary | ICD-10-CM | POA: Diagnosis not present

## 2020-01-29 DIAGNOSIS — J449 Chronic obstructive pulmonary disease, unspecified: Secondary | ICD-10-CM | POA: Diagnosis present

## 2020-01-29 DIAGNOSIS — Z20822 Contact with and (suspected) exposure to covid-19: Secondary | ICD-10-CM | POA: Insufficient documentation

## 2020-01-29 DIAGNOSIS — R569 Unspecified convulsions: Secondary | ICD-10-CM | POA: Insufficient documentation

## 2020-01-29 DIAGNOSIS — E8770 Fluid overload, unspecified: Principal | ICD-10-CM | POA: Insufficient documentation

## 2020-01-29 DIAGNOSIS — I69320 Aphasia following cerebral infarction: Secondary | ICD-10-CM | POA: Insufficient documentation

## 2020-01-29 DIAGNOSIS — T8619 Other complication of kidney transplant: Secondary | ICD-10-CM

## 2020-01-29 DIAGNOSIS — Z79899 Other long term (current) drug therapy: Secondary | ICD-10-CM | POA: Diagnosis not present

## 2020-01-29 DIAGNOSIS — R0602 Shortness of breath: Secondary | ICD-10-CM

## 2020-01-29 DIAGNOSIS — G9341 Metabolic encephalopathy: Secondary | ICD-10-CM | POA: Insufficient documentation

## 2020-01-29 DIAGNOSIS — I2510 Atherosclerotic heart disease of native coronary artery without angina pectoris: Secondary | ICD-10-CM | POA: Insufficient documentation

## 2020-01-29 DIAGNOSIS — K219 Gastro-esophageal reflux disease without esophagitis: Secondary | ICD-10-CM | POA: Diagnosis present

## 2020-01-29 DIAGNOSIS — N186 End stage renal disease: Secondary | ICD-10-CM | POA: Diagnosis not present

## 2020-01-29 DIAGNOSIS — J9621 Acute and chronic respiratory failure with hypoxia: Secondary | ICD-10-CM

## 2020-01-29 DIAGNOSIS — E785 Hyperlipidemia, unspecified: Secondary | ICD-10-CM | POA: Diagnosis present

## 2020-01-29 DIAGNOSIS — G40909 Epilepsy, unspecified, not intractable, without status epilepticus: Secondary | ICD-10-CM

## 2020-01-29 DIAGNOSIS — Z992 Dependence on renal dialysis: Secondary | ICD-10-CM | POA: Diagnosis not present

## 2020-01-29 DIAGNOSIS — I639 Cerebral infarction, unspecified: Secondary | ICD-10-CM | POA: Diagnosis present

## 2020-01-29 DIAGNOSIS — I10 Essential (primary) hypertension: Secondary | ICD-10-CM | POA: Diagnosis present

## 2020-01-29 LAB — CBC WITH DIFFERENTIAL/PLATELET
Abs Immature Granulocytes: 0.07 10*3/uL (ref 0.00–0.07)
Basophils Absolute: 0.1 10*3/uL (ref 0.0–0.1)
Basophils Relative: 1 %
Eosinophils Absolute: 0 10*3/uL (ref 0.0–0.5)
Eosinophils Relative: 1 %
HCT: 31.5 % — ABNORMAL LOW (ref 36.0–46.0)
Hemoglobin: 9.9 g/dL — ABNORMAL LOW (ref 12.0–15.0)
Immature Granulocytes: 1 %
Lymphocytes Relative: 18 %
Lymphs Abs: 1.4 10*3/uL (ref 0.7–4.0)
MCH: 26.1 pg (ref 26.0–34.0)
MCHC: 31.4 g/dL (ref 30.0–36.0)
MCV: 82.9 fL (ref 80.0–100.0)
Monocytes Absolute: 0.6 10*3/uL (ref 0.1–1.0)
Monocytes Relative: 7 %
Neutro Abs: 5.6 10*3/uL (ref 1.7–7.7)
Neutrophils Relative %: 72 %
Platelets: 169 10*3/uL (ref 150–400)
RBC: 3.8 MIL/uL — ABNORMAL LOW (ref 3.87–5.11)
RDW: 17.2 % — ABNORMAL HIGH (ref 11.5–15.5)
WBC: 7.7 10*3/uL (ref 4.0–10.5)
nRBC: 0 % (ref 0.0–0.2)

## 2020-01-29 LAB — TROPONIN I (HIGH SENSITIVITY)
Troponin I (High Sensitivity): 20 ng/L — ABNORMAL HIGH (ref <18)
Troponin I (High Sensitivity): 22 ng/L — ABNORMAL HIGH (ref <18)

## 2020-01-29 LAB — BASIC METABOLIC PANEL
Anion gap: 15 (ref 5–15)
BUN: 55 mg/dL — ABNORMAL HIGH (ref 8–23)
CO2: 22 mmol/L (ref 22–32)
Calcium: 9.4 mg/dL (ref 8.9–10.3)
Chloride: 99 mmol/L (ref 98–111)
Creatinine, Ser: 12.34 mg/dL — ABNORMAL HIGH (ref 0.44–1.00)
GFR calc Af Amer: 3 mL/min — ABNORMAL LOW (ref >60)
GFR calc non Af Amer: 3 mL/min — ABNORMAL LOW (ref >60)
Glucose, Bld: 194 mg/dL — ABNORMAL HIGH (ref 70–99)
Potassium: 4.9 mmol/L (ref 3.5–5.1)
Sodium: 136 mmol/L (ref 135–145)

## 2020-01-29 LAB — CREATININE, SERUM
Creatinine, Ser: 6.05 mg/dL — ABNORMAL HIGH (ref 0.44–1.00)
Creatinine, Ser: 7.68 mg/dL — ABNORMAL HIGH (ref 0.44–1.00)
GFR calc Af Amer: 8 mL/min — ABNORMAL LOW (ref >60)
GFR calc Af Amer: 6 mL/min — ABNORMAL LOW (ref >60)
GFR calc non Af Amer: 7 mL/min — ABNORMAL LOW (ref >60)
GFR calc non Af Amer: 5 mL/min — ABNORMAL LOW (ref >60)

## 2020-01-29 LAB — MAGNESIUM: Magnesium: 1.8 mg/dL (ref 1.7–2.4)

## 2020-01-29 LAB — SARS CORONAVIRUS 2 BY RT PCR (HOSPITAL ORDER, PERFORMED IN ~~LOC~~ HOSPITAL LAB): SARS Coronavirus 2: NEGATIVE

## 2020-01-29 LAB — HEPATITIS B SURFACE ANTIGEN: Hepatitis B Surface Ag: NONREACTIVE

## 2020-01-29 LAB — GLUCOSE, CAPILLARY
Glucose-Capillary: 147 mg/dL — ABNORMAL HIGH (ref 70–99)
Glucose-Capillary: 159 mg/dL — ABNORMAL HIGH (ref 70–99)

## 2020-01-29 LAB — HEPATITIS B SURFACE ANTIBODY,QUALITATIVE: Hep B S Ab: REACTIVE — AB

## 2020-01-29 LAB — HEPATIC FUNCTION PANEL
ALT: 6 U/L (ref 0–44)
AST: 14 U/L — ABNORMAL LOW (ref 15–41)
Albumin: 3.6 g/dL (ref 3.5–5.0)
Alkaline Phosphatase: 70 U/L (ref 38–126)
Bilirubin, Direct: 0.3 mg/dL — ABNORMAL HIGH (ref 0.0–0.2)
Indirect Bilirubin: 0.8 mg/dL (ref 0.3–0.9)
Total Bilirubin: 1.1 mg/dL (ref 0.3–1.2)
Total Protein: 7.2 g/dL (ref 6.5–8.1)

## 2020-01-29 LAB — BRAIN NATRIURETIC PEPTIDE: B Natriuretic Peptide: 872.3 pg/mL — ABNORMAL HIGH (ref 0.0–100.0)

## 2020-01-29 MED ORDER — ACETAMINOPHEN 650 MG RE SUPP: 650.0000 mg | Freq: Four times a day (QID) | RECTAL | Status: DC | PRN

## 2020-01-29 MED ORDER — CHLORHEXIDINE GLUCONATE CLOTH 2 % EX PADS
6.0000 | MEDICATED_PAD | Freq: Every day | CUTANEOUS | Status: DC
  Administered 2020-01-30: 6 via TOPICAL
  Filled 2020-01-29 (×2): qty 6

## 2020-01-29 MED ORDER — SODIUM CHLORIDE 0.9% FLUSH
3.0000 mL | INTRAVENOUS | Status: DC | PRN
  Administered 2020-01-29 – 2020-01-30 (×2): 3 mL via INTRAVENOUS

## 2020-01-29 MED ORDER — LIDOCAINE-PRILOCAINE 2.5-2.5 % EX CREA
1.0000 "application " | TOPICAL_CREAM | CUTANEOUS | Status: DC | PRN
  Filled 2020-01-29: qty 5

## 2020-01-29 MED ORDER — SODIUM CHLORIDE 0.9 % IV SOLN: 250.0000 mL | INTRAVENOUS | Status: DC | PRN

## 2020-01-29 MED ORDER — INSULIN ASPART 100 UNIT/ML ~~LOC~~ SOLN
0.0000 [IU] | Freq: Three times a day (TID) | SUBCUTANEOUS | Status: DC
  Administered 2020-01-29: 3 [IU] via SUBCUTANEOUS
  Administered 2020-01-30: 4 [IU] via SUBCUTANEOUS
  Filled 2020-01-29 (×2): qty 1
  Filled 2020-01-29: qty 0.2

## 2020-01-29 MED ORDER — ACETAMINOPHEN 325 MG PO TABS: 650.0000 mg | ORAL_TABLET | Freq: Four times a day (QID) | ORAL | Status: DC | PRN

## 2020-01-29 MED ORDER — GABAPENTIN 100 MG PO CAPS
100.0000 mg | ORAL_CAPSULE | Freq: Every day | ORAL | Status: DC
  Administered 2020-01-29: 100 mg via ORAL
  Filled 2020-01-29: qty 1

## 2020-01-29 MED ORDER — CLOPIDOGREL BISULFATE 75 MG PO TABS
75.0000 mg | ORAL_TABLET | Freq: Every day | ORAL | Status: DC
  Administered 2020-01-29 – 2020-01-30 (×2): 75 mg via ORAL
  Filled 2020-01-29 (×2): qty 1

## 2020-01-29 MED ORDER — SODIUM CHLORIDE 0.9% FLUSH: 3.0000 mL | INTRAVENOUS | Status: DC | PRN

## 2020-01-29 MED ORDER — CALCIUM ACETATE (PHOS BINDER) 667 MG PO CAPS
2001.0000 mg | ORAL_CAPSULE | Freq: Three times a day (TID) | ORAL | Status: DC
  Administered 2020-01-29 – 2020-01-30 (×3): 2001 mg via ORAL
  Filled 2020-01-29 (×6): qty 3

## 2020-01-29 MED ORDER — PANTOPRAZOLE SODIUM 40 MG PO TBEC
40.0000 mg | DELAYED_RELEASE_TABLET | Freq: Every day | ORAL | Status: DC
  Administered 2020-01-29 – 2020-01-30 (×2): 40 mg via ORAL
  Filled 2020-01-29 (×2): qty 1

## 2020-01-29 MED ORDER — LEVETIRACETAM 500 MG PO TABS
1000.0000 mg | ORAL_TABLET | Freq: Every day | ORAL | Status: DC
  Administered 2020-01-29 – 2020-01-30 (×2): 1000 mg via ORAL
  Filled 2020-01-29 (×2): qty 2

## 2020-01-29 MED ORDER — HEPARIN SODIUM (PORCINE) 5000 UNIT/ML IJ SOLN
5000.0000 [IU] | Freq: Three times a day (TID) | INTRAMUSCULAR | Status: DC
  Administered 2020-01-29 – 2020-01-30 (×2): 5000 [IU] via SUBCUTANEOUS
  Filled 2020-01-29 (×3): qty 1

## 2020-01-29 MED ORDER — HYDROCORTISONE (PERIANAL) 2.5 % EX CREA
1.0000 "application " | TOPICAL_CREAM | Freq: Two times a day (BID) | CUTANEOUS | Status: DC | PRN
  Filled 2020-01-29: qty 28.35

## 2020-01-29 MED ORDER — DOCUSATE SODIUM 100 MG PO CAPS
100.0000 mg | ORAL_CAPSULE | Freq: Two times a day (BID) | ORAL | Status: DC
  Administered 2020-01-29 – 2020-01-30 (×2): 100 mg via ORAL
  Filled 2020-01-29 (×3): qty 1

## 2020-01-29 MED ORDER — ASPIRIN EC 325 MG PO TBEC
325.0000 mg | DELAYED_RELEASE_TABLET | Freq: Every day | ORAL | Status: DC
  Administered 2020-01-29 – 2020-01-30 (×2): 325 mg via ORAL
  Filled 2020-01-29 (×2): qty 1

## 2020-01-29 MED ORDER — VITAMIN D 25 MCG (1000 UNIT) PO TABS
1000.0000 [IU] | ORAL_TABLET | Freq: Every day | ORAL | Status: DC
  Administered 2020-01-29 – 2020-01-30 (×2): 1000 [IU] via ORAL
  Filled 2020-01-29 (×2): qty 1

## 2020-01-29 MED ORDER — METOPROLOL SUCCINATE ER 25 MG PO TB24
100.0000 mg | ORAL_TABLET | Freq: Every day | ORAL | Status: DC
  Administered 2020-01-29 – 2020-01-30 (×2): 100 mg via ORAL
  Filled 2020-01-29 (×2): qty 4

## 2020-01-29 MED ORDER — EPOETIN ALFA 10000 UNIT/ML IJ SOLN: 4000.0000 [IU] | INTRAMUSCULAR | Status: DC

## 2020-01-29 MED ORDER — LOSARTAN POTASSIUM 50 MG PO TABS
100.0000 mg | ORAL_TABLET | Freq: Every day | ORAL | Status: DC
  Administered 2020-01-29 – 2020-01-30 (×2): 100 mg via ORAL
  Filled 2020-01-29 (×2): qty 2

## 2020-01-29 MED ORDER — SODIUM CHLORIDE 0.9% FLUSH: 3.0000 mL | Freq: Two times a day (BID) | INTRAVENOUS | Status: DC

## 2020-01-29 MED ORDER — ATORVASTATIN CALCIUM 80 MG PO TABS
80.0000 mg | ORAL_TABLET | Freq: Every evening | ORAL | Status: DC
  Administered 2020-01-29: 80 mg via ORAL
  Filled 2020-01-29: qty 1

## 2020-01-29 MED ORDER — FLUTICASONE PROPIONATE 50 MCG/ACT NA SUSP
1.0000 | Freq: Every day | NASAL | Status: DC
  Administered 2020-01-29: 1 via NASAL
  Filled 2020-01-29: qty 16

## 2020-01-29 NOTE — H&P (Signed)
History and Physical    Cynthia Dean DOB: January 26, 1951 DOA: 01/29/2020  PCP: Lowella Bandy, MD Consultants:  [facility] Patient coming from:  Home - lives with ; [facility]: boyfriend   Chief Complaint: encephalopathy, sob and hypoxia  HPI: Cynthia Dean is a 69 y.o. 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  Reportedly patient missed dialysis 2 days ago and was at dialysis this morning when staff noted increased work of breathing and worsening aphasia and confusion. Patient stated she "did not feel well" 2 days ago and therefore skipped dialysis. Associated symptoms include worsening LE edema.  Denies CP, palpitations, cough, fever, denies abdominal pain nausea vomiting diarrhea constipation.  Denies any worsening of her aphasia or right sided weakness.  Daughter was at the bedside who reported that she seemed to be at baseline.  The boyfriend with whom she resides was contacted who also noted she seemed to be at baseline in terms of her mentation and aphasia.    ED Course: Emergency department she is afebrile hemodynamically stable but does have an oxygen saturation level of 70% on room air.  Chest x-ray reveals increased bilateral pulmonary interstitial opacity with chronic cardiomegaly consistent with pulmonary edema.  BNP is 872, potassium 4.9, creatinine 12.34.  She is evaluated by nephrology in the emergency department and dialysis initiated prior to admission.  Review of Systems: As per HPI; otherwise review of systems reviewed  and negative.   Ambulatory Status:  Ambulates with walker  Past Medical History:  Diagnosis Date  . Acute on chronic respiratory failure with hypoxia ([place]) 07/30/2019  . Acute respiratory failure with hypoxia ([place]) 12/31/2018  . Carotid arterial disease ([place] )    a. 02/2018 < bilat ICA stenosis.  [name] COPD (chronic obstructive pulmonary disease) ([place])   . Coronary artery disease    a. 11/2015 MV: EF 57%, no ischemia/infarct.  . Cryptogenic stroke ([place])    a. 10/2017 s/p implantable loop recorder (no Afib to date).  . Diabetes mellitus without complication ([place])   . Diarrhea   . ESRD on dialysis ([place])   . GI bleed    a. 01/2019 EGD/Colonoscopy: Mild gastritis. Colitis.  [name] Heart murmur    a. 12/2018 Echo: EF 55-60%, impaired relaxation. Mod dil LA. Mildly dil RA. Mild Ca2+ of AoV.  [name] Hematemesis 06/27/2017  . History of 2019 novel coronavirus disease (COVID-19) 07/30/2019  . Hyperkalemia   . Hyperlipidemia   . Hypertension   . Intractable nausea and vomiting 08/08/2015  . Nausea & vomiting 05/30/2018  . Nausea and vomiting 05/29/2018  . Pneumonia due to COVID-19 virus     Past Surgical History:  Procedure Laterality Date  . A/V FISTULAGRAM Left 12/20/2016   Procedure: A/V Fistulagram;  Surgeon: Katha Cabal, MD;  Location: [place] [facility];  Service: Cardiovascular;  Laterality: Left;  . A/V SHUNT INTERVENTION N/A 12/20/2016   Procedure: A/V Shunt Intervention;  Surgeon: Katha Cabal, MD;  Location: [place] [facility];  Service: Cardiovascular;  Laterality: N/A;  . A/V SHUNTOGRAM Left 09/11/2017   Procedure: A/V SHUNTOGRAM;  Surgeon: Algernon Huxley, MD;  Location: [place] [facility]  [facility];  Service: Cardiovascular;  Laterality: Left;  . A/V SHUNTOGRAM Left 11/21/2019   Procedure: A/V SHUNTOGRAM;  Surgeon: Algernon Huxley, MD;  Location: [place] [facility];  Service: Cardiovascular;  Laterality: Left;  . BREAST BIOPSY Bilateral 07/19/00   neg  . COLONOSCOPY WITH PROPOFOL N/A 02/16/2019    Procedure: COLONOSCOPY WITH PROPOFOL;  Surgeon: Toledo, Benay Pike, MD;  Location: [facility];  Service: Gastroenterology;  Laterality: N/A;  . ESOPHAGOGASTRODUODENOSCOPY (EGD) WITH PROPOFOL N/A 02/16/2019   Procedure: ESOPHAGOGASTRODUODENOSCOPY (EGD) WITH PROPOFOL;  Surgeon: Toledo, Benay Pike, MD;  Location: [facility];  Service: Gastroenterology;  Laterality: N/A;  . LOOP RECORDER INSERTION N/A 11/16/2017   Procedure: LOOP RECORDER INSERTION;  Surgeon: Deboraha Sprang, MD;  Location: [place] [facility];  Service: Cardiovascular;  Laterality: N/A;  . PERIPHERAL VASCULAR CATHETERIZATION Left 02/02/2015   Procedure: A/V Shuntogram/Fistulagram;  Surgeon: Algernon Huxley, MD;  Location: [place] [facility];  Service: Cardiovascular;  Laterality: Left;  . PERIPHERAL VASCULAR CATHETERIZATION Left 02/02/2015   Procedure: A/V Shunt Intervention;  Surgeon: Algernon Huxley, MD;  Location: [place] [facility];  Service: Cardiovascular;  Laterality: Left;  . PERIPHERAL VASCULAR CATHETERIZATION Left 03/09/2015   Procedure: A/V Shuntogram/Fistulagram;  Surgeon: Algernon Huxley, MD;  Location: [place] [facility];  Service: Cardiovascular;  Laterality: Left;  . PERIPHERAL VASCULAR CATHETERIZATION N/A 03/09/2015   Procedure: A/V Shunt Intervention;  Surgeon: Algernon Huxley, MD;  Location: [place] [facility];  Service: Cardiovascular;  Laterality: N/A;  . PERIPHERAL VASCULAR CATHETERIZATION Left 04/17/2015   Procedure: Upper Extremity Angiography;  Surgeon: Algernon Huxley, MD;  Location: [place] [facility];  Service: Cardiovascular;  Laterality: Left;  . PERIPHERAL VASCULAR CATHETERIZATION  04/17/2015   Procedure: Upper Extremity Intervention;  Surgeon: Algernon Huxley, MD;  Location: [place] [facility];  Service: Cardiovascular;;  . PERIPHERAL VASCULAR CATHETERIZATION N/A 08/10/2015   Procedure: A/V Shuntogram/Fistulagram;  Surgeon: Algernon Huxley, MD;  Location: [place] [facility];  Service: Cardiovascular;  Laterality:  N/A;  . PERIPHERAL VASCULAR CATHETERIZATION N/A 08/10/2015   Procedure: A/V Shunt Intervention;  Surgeon: Algernon Huxley, MD;  Location: [place] [facility];  Service: Cardiovascular;  Laterality: N/A;  . PERIPHERAL VASCULAR CATHETERIZATION Left 04/11/2016   Procedure: A/V Shuntogram/Fistulagram;  Surgeon: Algernon Huxley, MD;  Location: [place] [facility];  Service: Cardiovascular;  Laterality: Left;  . TEE WITHOUT CARDIOVERSION N/A 11/16/2017   Procedure: TRANSESOPHAGEAL ECHOCARDIOGRAM (TEE);  Surgeon: Minna Merritts, MD;  Location: [facility];  Service: Cardiovascular;  Laterality: N/A;    Social History   Socioeconomic History  . Marital status: Single    Spouse name: Not on file  . Number of children: 5  . Years of education: Not on file  . Highest education level: Not on file  Occupational History  . Occupation: Disability   Tobacco Use  . Smoking status: Former Smoker    Packs/day: 0.00    Types: Cigarettes  . Smokeless tobacco: Never Used  Vaping Use  . Vaping Use: Never used  Substance and Sexual Activity  . Alcohol use: No  . Drug use: No  . Sexual activity: Not Currently  Other Topics Concern  . Not on file  Social History Narrative   Live with Fiance   Social Determinants of Health   Financial Resource Strain:   . Difficulty of Paying Living Expenses:   Food Insecurity:   . Worried About Charity fundraiser in the Last Year:   .  Ran Out of Food in the Last Year:   Transportation Needs:   . Film/video editor (Medical):   [name] Lack of Transportation (Non-Medical):   Physical Activity:   . Days of Exercise per Week:   . Minutes of Exercise per Session:   Stress:   . Feeling of Stress :   Social Connections:   . Frequency of Communication with Friends and Family:   . Frequency of Social Gatherings with Friends and Family:   . Attends Religious Services:   . Active Member of Clubs or Organizations:   . Attends Archivist Meetings:   [name] Marital  Status:   Intimate Partner Violence:   . Fear of Current or Ex-Partner:   . Emotionally Abused:   [name] Physically Abused:   . Sexually Abused:     Allergies  Allergen Reactions  . Hydrocodone     Intolerant more than allergic  . Tape Itching    Skin Dermatitis/itching (tape adhesive) Skin Dermatitis/itching (tape adhesive)  . Tapentadol Itching    Skin Dermatitis/itching (tape adhesive) Skin Dermatitis/itching (tape adhesive)     Family History  Problem Relation Age of Onset  . Breast cancer Father 60  . Stroke Mother   . Heart attack Mother   . Heart Problems Sister   . Breast cancer Cousin 33       1 st cousin. Maternal     Prior to Admission medications   Medication Sig Start Date End Date Taking? Authorizing Provider  aspirin EC 325 MG tablet Take 325 mg by mouth daily. 05/28/19   [provider]  atorvastatin (LIPITOR) 80 MG tablet Take 80 mg by mouth every evening.     [provider]  calcium acetate (PHOSLO) 667 MG capsule Take 3 capsules (2,001 mg total) by mouth 3 (three) times daily with meals. 08/11/19   Ezekiel Slocumb, DO  cholecalciferol (VITAMIN D) 25 MCG tablet Take 1 tablet (1,000 Units total) by mouth daily. 08/11/19   Ezekiel Slocumb, DO  clopidogrel (PLAVIX) 75 MG tablet TAKE ONE TABLET BY MOUTH ONCE DAILY Patient taking differently: Take 75 mg by mouth daily.  06/14/16   Stegmayer, Joelene Millin A, PA-C  docusate sodium (COLACE) 100 MG capsule Take 1 capsule (100 mg total) by mouth 2 (two) times daily. 10/18/19 10/17/20  Harvest Dark, MD  fluticasone (FLONASE) 50 MCG/ACT nasal spray 2 sprays by Each Nare route daily. Prior to CPAP 08/07/18   [provider]  gabapentin (NEURONTIN) 100 MG capsule Take 1 capsule (100 mg total) by mouth at bedtime. 10/25/19   Patrecia Pour, MD  hydrocortisone (ANUSOL-HC) 2.5 % rectal cream Place 1 application rectally 2 (two) times daily. Patient taking differently: Place 1 application rectally 2  (two) times daily as needed.  09/09/19   Lin Landsman, MD  Ipratropium-Albuterol (COMBIVENT) 20-100 MCG/ACT AERS respimat Inhale 1 puff into the lungs every 6 (six) hours. 08/11/19   Ezekiel Slocumb, DO  levETIRAcetam (KEPPRA) 1000 MG tablet Take 1 tablet (1,000 mg total) by mouth daily. 03/15/18   Vaughan Basta, MD  lidocaine-prilocaine (EMLA) cream Apply 1 application topically as needed (for port access).     [provider]  losartan (COZAAR) 100 MG tablet Take 100 mg by mouth daily. Takes in the morning 09/12/17   [provider]  metoprolol succinate (TOPROL-XL) 100 MG 24 hr tablet Take 100 mg by mouth daily. Take with or immediately following a meal.     [provider]  omeprazole (PRILOSEC) 20 MG capsule Take 20 mg by mouth 2 (two) times daily.     [provider]  torsemide (DEMADEX) 100 MG tablet Take 100 mg by mouth daily. 10/10/17   [provider]    Physical Exam: Vitals:   01/29/20 1100 01/29/20 1115 01/29/20 1130 01/29/20 1145  BP: (!) 192/94 (!) 171/63 (!) 149/80 (!) 179/79  Pulse: 64 65 66 67  Resp: (!) 23 (!) 23 (!) 24 (!) 21  Temp:      TempSrc:      SpO2:      Weight:      Height:         . General:  Appears calm and comfortable, obese in the dialysis department and is NAD . Eyes:  PERRL, EOMI, normal lids, iris . ENT:  grossly normal hearing, lips & tongue, mmm; appropriate dentition . Neck:  no LAD, masses or thyromegaly; no carotid bruits . Cardiovascular:  RRR, no m/r/g. No LE edema.  [name] Respiratory: No increased work of breathing.  Fine crackles auscultated bilaterally . Abdomen: BS soft, NT, ND, NABS . Back:   normal alignment, no CVAT . Skin:  no rash or induration seen on limited exam . Musculoskeletal:  grossly normal tone BUE/BLE, good ROM, no bony abnormality . Lower extremity: Trace LE edema.  Limited foot exam with no ulcerations.  2+ distal pulses. [name] Psychiatric:  grossly normal mood and  affect, speech fluent and appropriate, AOx3 . Neurologic: Alert oriented to self and place.  Very slow to respond to basic questions.  Able to follow simple commands.  Moves all extremities spontaneously.  Bilateral lower extremity strength 5 out of 5.  Speech is quite slow slightly slurred.    Radiological Exams on Admission: DG Chest Port 1 View  Result Date: 01/29/2020 CLINICAL DATA:  69 year old female with confusion, stroke-like symptoms. Shortness of breath, hypoxia. On home oxygen. EXAM: PORTABLE CHEST 1 VIEW COMPARISON:  Portable chest 10/21/2019 and earlier. FINDINGS: Portable AP upright view at 0720 hours. Increased bilateral pulmonary interstitial opacity with basilar predominance. Stable mild cardiomegaly and mediastinal contours. Left chest wall loop recorder or ICD again noted. Visualized tracheal air column is within normal limits. No pneumothorax, pleural effusion or consolidation. No acute osseous abnormality identified. IMPRESSION: Increased bilateral pulmonary interstitial opacity with chronic cardiomegaly. Favor acute pulmonary edema over viral/atypical respiratory infection. Electronically Signed   By: Genevie Ann M.D.   On: 01/29/2020 07:47    EKG: Independently reviewed.  NSR with prolonged QT.   Labs on Admission: I have personally reviewed the available labs and imaging studies at the time of the admission.  Pertinent labs:      Assessment/Plan Principal Problem:   Acute on chronic respiratory failure with hypoxia (HCC) Active Problems:   ESRD on dialysis ([place])   Acute on chronic diastolic CHF (congestive heart failure) (HCC)   Essential hypertension   CVA (cerebral vascular accident) ([place])   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[place])   CAD (coronary artery disease)   COPD (chronic obstructive pulmonary disease) (HCC)   Seizure (HCC)   Acute metabolic encephalopathy   Aphasia as late effect of cerebrovascular accident (CVA)    Hyperlipidemia   Obesity, Class III, BMI 40-49.9 (morbid obesity) (HCC)   GERD (gastroesophageal reflux disease)   #1.  Acute on chronic respiratory failure with hypoxia.  Likely secondary to acute on chronic diastolic heart failure in the setting of noncompliance with dialysis.  Chest x-ray as  noted above.  Oxygen saturation level noted to be in the 70s in the emergency department on room air.  She is provided with oxygen supplementation and evaluated by nephrology in the ED.  Dialysis was initiated prior to admission.  At the time of admission she is on 3 L nasal cannula and oxygen saturation level greater than 90%. -Dialysis per nephrology -Oxygen supplementation -Monitor oxygen saturation level  #2.  Acute on chronic diastolic heart failure secondary to noncompliance with hemodialysis.  BNP elevated.  Chest x-ray as noted above.  Chart review indicates echo 2019 with an EF of 55%, mild concentric hypertrophy grade 1 diastolic dysfunction.  Home medications include losartan, metoprolol, torsemide. -Dialysis as noted above -Monitor intake and output -Obtain daily weights -Continue home meds  #3.  Acute metabolic encephalopathy/history of CVA with residual aphasia and right-sided weakness.  Sent to ED from dialysis with reportedly worsening aphasia.  How far from baseline she is remains unclear.  Family indicate close to baseline. No focal defecits. Subtle differences may be related to above. No signs of infection.  Of note chart review indicates recent admission for hypoglycemia with same.  At that time insulin stopped, Neurontin dose decreased. -MRI for completeness -dialysis as noted above -avoid hypotension, hypoglycemia -neuro checks -monitor labs -continue home asa and plavix -minimize sedating meds  #4.  End-stage renal disease.  She is a Monday Wednesday Friday dialysis patient.  She missed 1 dialysis session as noted above.  She was evaluated by nephrology in the emergency  department.  Dialysis initiated prior to admission.  Potassium level high end of normal creatinine 14. -Dialysis per nephrology -Continue home meds -Obtain daily weights -Further management per nephrology  #5.  Hypertension.  Blood pressure somewhat elevated on presentation.  Likely related to missed dialysis.  Home medications include Cozaar, metoprolol, torsemide. -Continue home meds -Monitor  #6. Diabetes type II.  Serum glucose on admission 194. No meds on home med list.  Chart review indicates recent admission for severe hypoglycemia and discharge summary indicates that her previous 70/30 insulin was held and CBGs were at goal. -Sliding scale insulin -Obtain a hemoglobin A1c  #7. CAD/hyperlipidemia. No chest pain. ekg without acute changes. No events on tele. Home meds include statin, BB and statin  #8.  History of CVA/seizure disorder.  See #2. -Follow MRI results -Continue home meds -PT/OT eval  #9.  Obesity.  BMI 41. -nutritional consult  #10. Jerrye Bushy. Stable at baseline -continue home meds  Note: This patient has been tested and is negative for the novel coronavirus COVID-19.  DVT prophylaxis:  SCDs and plavix Code Status:  Full - confirmed with patient/family Family Communication: son and boyfriend Disposition Plan:  Home once clinically improved Consults called: [facility] neph[facility]  Admission status: inpatient    Cynthia Gunning NP [facility]   How to contact the [facility] Attending or Consulting provider [place] or covering provider during after hours [place], for this patient?  1. Check the care team in [facility] and look for a) attending/consulting [facility] provider listed and b) the [facility] team listed 2. Log into [email or web address] and use [place]'s universal password to access. If you do not have the password, please contact the hospital operator. 3. Locate the [facility] provider you are looking for under [facility] and page to a number that you can be directly reached. 4. If  you still have difficulty reaching the provider, please page the [facility] (Director on Call) for the Hospitalists listed on amion for  assistance.   01/29/2020, 12:04 PM

## 2020-01-29 NOTE — Progress Notes (Signed)
HD complete 

## 2020-01-29 NOTE — ED Notes (Signed)
Pt reports last dialysis was Friday, pt missed Monday. Dialysis had not started this AM. Pt hypertensive per EMS

## 2020-01-29 NOTE — ED Provider Notes (Addendum)
[facility] Emergency Department Provider Note  ____________________________________________   First MD Initiated Contact with Patient 01/29/20 [phone]     (approximate)  I have reviewed the triage vital signs and the nursing notes.   HISTORY  Chief Complaint Shortness of Breath    HPI Cynthia Dean is a 69 y.o. female with ESRD, COPD, GI bleed, prior Covid pneumonia, coronary disease who comes in with confusion.  Patient is normally on 2 L of oxygen was not on any oxygen during dialysis.  Patient reports feeling short of breath.  States that she just does not like to use it sometimes.  Patient's last dialysis was on Friday.  She missed dialysis on Monday.  Patient stated that she missed it because she is having a little bit of upset stomach and not feeling well.  Denies any abdominal pain at this time.  Patient stated her shortness of breath began today, constant, nothing makes it better, worse with moving around.  She was sent over here for concerns for stroke although her oxygen levels were in the 70s upon EMS arrival.  Patient is on 4 to 5 L of oxygen.  Patient is normally on 2 L.  She denies any chest pain, abdominal pain.  She denies any falls.  Daughter is at bedside and states that she seems to be acting her normal self.  May be slightly off but she is not able to really put her finger onto exactly what that means.          Past Medical History:  Diagnosis Date  . Acute on chronic respiratory failure with hypoxia ([place]) 07/30/2019  . Acute respiratory failure with hypoxia ([place]) 12/31/2018  . Carotid arterial disease ([place])    a. 02/2018 < bilat ICA stenosis.  [name] COPD (chronic obstructive pulmonary disease) ([place])   . Coronary artery disease    a. 11/2015 MV: EF 57%, no ischemia/infarct.  . Cryptogenic stroke ([place])    a. 10/2017 s/p implantable loop recorder (no Afib to date).  . Diabetes mellitus without complication ([place])   . Diarrhea   . ESRD on  dialysis ([place])   . GI bleed    a. 01/2019 EGD/Colonoscopy: Mild gastritis. Colitis.  [name] Heart murmur    a. 12/2018 Echo: EF 55-60%, impaired relaxation. Mod dil LA. Mildly dil RA. Mild Ca2+ of AoV.  [name] Hematemesis 06/27/2017  . History of 2019 novel coronavirus disease (COVID-19) 07/30/2019  . Hyperkalemia   . Hyperlipidemia   . Hypertension   . Intractable nausea and vomiting 08/08/2015  . Nausea & vomiting 05/30/2018  . Nausea and vomiting 05/29/2018  . Pneumonia due to COVID-19 virus     Patient Active Problem List   Diagnosis Date Noted  . Hypoglycemia 10/21/2019  . Unresponsiveness 10/21/2019  . GERD (gastroesophageal reflux disease) 10/21/2019  . Hypothermia   . Sepsis with encephalopathy without septic shock ([place])   . Acute metabolic encephalopathy   . Acute on chronic diastolic CHF (congestive heart failure) ([place])   . Rectal bleed 02/13/2019  . Aphasia as late effect of cerebrovascular accident (CVA) 05/01/2018  . Seizure ([place]) 03/14/2018  . Stroke ([place]) 03/07/2018  . Slurred speech 11/14/2017  . 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[place]) 11/12/2017  . CAD (coronary artery disease) 11/12/2017  . COPD (chronic obstructive pulmonary disease) ([place]) 11/12/2017  . ESRD on dialysis ([place]) 11/12/2017  . CVA (cerebral vascular accident) ([place]) 04/14/2016  . Essential hypertension 11/27/2015  . Hyperlipidemia  11/27/2015  . Prolonged Q-T interval on ECG 11/26/2015  . Aphasia complicating stroke 64/33/2951  . Cervical spine arthritis 07/27/2015  . Diabetic retinopathy without macular edema associated with type 2 diabetes mellitus ([place]) 07/27/2015  . Osteoarthritis of spine with radiculopathy, lumbar region 07/27/2015  . Obesity, Class III, BMI 40-49.9 (morbid obesity) ([place]) 07/27/2015  . Vitamin D deficiency 01/21/2011  . Diastolic heart failure ([place]) 10/27/2010    Past Surgical History:  Procedure Laterality Date  . A/V FISTULAGRAM Left 12/20/2016    Procedure: A/V Fistulagram;  Surgeon: Katha Cabal, MD;  Location: [place] [facility];  Service: Cardiovascular;  Laterality: Left;  . A/V SHUNT INTERVENTION N/A 12/20/2016   Procedure: A/V Shunt Intervention;  Surgeon: Katha Cabal, MD;  Location: [place] [facility];  Service: Cardiovascular;  Laterality: N/A;  . A/V SHUNTOGRAM Left 09/11/2017   Procedure: A/V SHUNTOGRAM;  Surgeon: Algernon Huxley, MD;  Location: [place] [facility];  Service: Cardiovascular;  Laterality: Left;  . A/V SHUNTOGRAM Left 11/21/2019   Procedure: A/V SHUNTOGRAM;  Surgeon: Algernon Huxley, MD;  Location: [place] [facility];  Service: Cardiovascular;  Laterality: Left;  . BREAST BIOPSY Bilateral 07/19/00   neg  . COLONOSCOPY WITH PROPOFOL N/A 02/16/2019   Procedure: COLONOSCOPY WITH PROPOFOL;  Surgeon: Toledo, Benay Pike, MD;  Location: [facility];  Service: Gastroenterology;  Laterality: N/A;  . ESOPHAGOGASTRODUODENOSCOPY (EGD) WITH PROPOFOL N/A 02/16/2019   Procedure: ESOPHAGOGASTRODUODENOSCOPY (EGD) WITH PROPOFOL;  Surgeon: Toledo, Benay Pike, MD;  Location: [facility];  Service: Gastroenterology;  Laterality: N/A;  . LOOP RECORDER INSERTION N/A 11/16/2017   Procedure: LOOP RECORDER INSERTION;  Surgeon: Deboraha Sprang, MD;  Location: [place] [facility];  Service: Cardiovascular;  Laterality: N/A;  . PERIPHERAL VASCULAR CATHETERIZATION Left 02/02/2015   Procedure: A/V Shuntogram/Fistulagram;  Surgeon: Algernon Huxley, MD;  Location: [place] [facility];  Service: Cardiovascular;  Laterality: Left;  . PERIPHERAL VASCULAR CATHETERIZATION Left 02/02/2015   Procedure: A/V Shunt Intervention;  Surgeon: Algernon Huxley, MD;  Location: [place] [facility];  Service: Cardiovascular;  Laterality: Left;  . PERIPHERAL VASCULAR CATHETERIZATION Left 03/09/2015   Procedure: A/V Shuntogram/Fistulagram;  Surgeon: Algernon Huxley, MD;  Location: [place] [facility];  Service: Cardiovascular;  Laterality: Left;  . PERIPHERAL  VASCULAR CATHETERIZATION N/A 03/09/2015   Procedure: A/V Shunt Intervention;  Surgeon: Algernon Huxley, MD;  Location: [place] [facility];  Service: Cardiovascular;  Laterality: N/A;  . PERIPHERAL VASCULAR CATHETERIZATION Left 04/17/2015   Procedure: Upper Extremity Angiography;  Surgeon: Algernon Huxley, MD;  Location: [place] [facility];  Service: Cardiovascular;  Laterality: Left;  . PERIPHERAL VASCULAR CATHETERIZATION  04/17/2015   Procedure: Upper Extremity Intervention;  Surgeon: Algernon Huxley, MD;  Location: [place] [facility];  Service: Cardiovascular;;  . PERIPHERAL VASCULAR CATHETERIZATION N/A 08/10/2015   Procedure: A/V Shuntogram/Fistulagram;  Surgeon: Algernon Huxley, MD;  Location: [place] [facility];  Service: Cardiovascular;  Laterality: N/A;  . PERIPHERAL VASCULAR CATHETERIZATION N/A 08/10/2015   Procedure: A/V Shunt Intervention;  Surgeon: Algernon Huxley, MD;  Location: [place] [facility];  Service: Cardiovascular;  Laterality: N/A;  . PERIPHERAL VASCULAR CATHETERIZATION Left 04/11/2016   Procedure: A/V Shuntogram/Fistulagram;  Surgeon: Algernon Huxley, MD;  Location: [place] [facility];  Service: Cardiovascular;  Laterality: Left;  . TEE WITHOUT CARDIOVERSION N/A 11/16/2017   Procedure: TRANSESOPHAGEAL ECHOCARDIOGRAM (TEE);  Surgeon: Minna Merritts, MD;  Location: [facility];  Service: Cardiovascular;  Laterality:  N/A;    Prior to Admission medications   Medication Sig Start Date End Date Taking? Authorizing Provider  aspirin EC 325 MG tablet Take 325 mg by mouth daily. 05/28/19   [provider]  atorvastatin (LIPITOR) 80 MG tablet Take 80 mg by mouth every evening.     [provider]  calcium acetate (PHOSLO) 667 MG capsule Take 3 capsules (2,001 mg total) by mouth 3 (three) times daily with meals. 08/11/19   Ezekiel Slocumb, DO  cholecalciferol (VITAMIN D) 25 MCG tablet Take 1 tablet (1,000 Units total) by mouth daily. 08/11/19   Ezekiel Slocumb, DO  clopidogrel  (PLAVIX) 75 MG tablet TAKE ONE TABLET BY MOUTH ONCE DAILY Patient taking differently: Take 75 mg by mouth daily.  06/14/16   Stegmayer, Joelene Millin A, PA-C  docusate sodium (COLACE) 100 MG capsule Take 1 capsule (100 mg total) by mouth 2 (two) times daily. 10/18/19 10/17/20  Harvest Dark, MD  fluticasone (FLONASE) 50 MCG/ACT nasal spray 2 sprays by Each Nare route daily. Prior to CPAP 08/07/18   [provider]  gabapentin (NEURONTIN) 100 MG capsule Take 1 capsule (100 mg total) by mouth at bedtime. 10/25/19   Patrecia Pour, MD  hydrocortisone (ANUSOL-HC) 2.5 % rectal cream Place 1 application rectally 2 (two) times daily. Patient taking differently: Place 1 application rectally 2 (two) times daily as needed.  09/09/19   Lin Landsman, MD  Ipratropium-Albuterol (COMBIVENT) 20-100 MCG/ACT AERS respimat Inhale 1 puff into the lungs every 6 (six) hours. 08/11/19   Ezekiel Slocumb, DO  levETIRAcetam (KEPPRA) 1000 MG tablet Take 1 tablet (1,000 mg total) by mouth daily. 03/15/18   Vaughan Basta, MD  lidocaine-prilocaine (EMLA) cream Apply 1 application topically as needed (for port access).     [provider]  losartan (COZAAR) 100 MG tablet Take 100 mg by mouth daily. Takes in the morning 09/12/17   [provider]  metoprolol succinate (TOPROL-XL) 100 MG 24 hr tablet Take 100 mg by mouth daily. Take with or immediately following a meal.     [provider]  omeprazole (PRILOSEC) 20 MG capsule Take 20 mg by mouth 2 (two) times daily.     [provider]  torsemide (DEMADEX) 100 MG tablet Take 100 mg by mouth daily. 10/10/17   [provider]    Allergies Hydrocodone, Tape, and Tapentadol  Family History  Problem Relation Age of Onset  . Breast cancer Father 90  . Stroke Mother   . Heart attack Mother   . Heart Problems Sister   . Breast cancer Cousin 13       1 st cousin. Maternal     Social History Social History   Tobacco  Use  . Smoking status: Former Smoker    Packs/day: 0.00    Types: Cigarettes  . Smokeless tobacco: Never Used  Vaping Use  . Vaping Use: Never used  Substance Use Topics  . Alcohol use: No  . Drug use: No      Review of Systems Constitutional: No fever/chills Eyes: No visual changes. ENT: No sore throat. Cardiovascular: No chest pain Respiratory: Positive for SOB Gastrointestinal: No abdominal pain.  No nausea, no vomiting.  No diarrhea.  No constipation. Genitourinary: Negative for dysuria. Musculoskeletal: Negative for back pain. Skin: Negative for rash. Neurological: Negative for headaches, focal weakness or numbness. All other ROS negative ____________________________________________   PHYSICAL EXAM:  VITAL SIGNS: ED Triage Vitals  Enc Vitals Group  BP 01/29/20 0658 (!) 185/79     Pulse Rate 01/29/20 0658 73     Resp 01/29/20 0658 (!) 22     Temp 01/29/20 0658 98.6 F (37 C)     Temp Source 01/29/20 0658 Oral     SpO2 01/29/20 0658 94 %     Weight 01/29/20 0659 280 lb (127 kg)     Height 01/29/20 0659 5\' 9"  (1.753 m)     Head Circumference --      Peak Flow --      Pain Score 01/29/20 0658 0     Pain Loc --      Pain Edu? --      Excl. in [place]? --     Constitutional: Alert and oriented x2. Well appearing and in no acute distress. Eyes: Conjunctivae are normal. EOMI. Head: Atraumatic. Nose: No congestion/rhinnorhea. Mouth/Throat: Mucous membranes are moist.   Neck: No stridor. Trachea Midline. FROM Cardiovascular: Normal rate, regular rhythm. Grossly normal heart sounds.  Good peripheral circulation. Respiratory: On 4 L of oxygen, no increased work of breathing Gastrointestinal: Soft and nontender. No distention. No abdominal bruits.  Musculoskeletal: No lower extremity tenderness nor edema.  No joint effusions.  Fistula with good thrill in her left arm Neurologic:  Normal speech and language. No gross focal neurologic deficits are appreciated.  Cn  2-12 intact. Equal strength in arms and legs.  Skin:  Skin is warm, dry and intact. No rash noted. Psychiatric: Mood and affect are normal. Speech and behavior are normal. GU: Deferred   ____________________________________________   LABS (all labs ordered are listed, but only abnormal results are displayed)  Labs Reviewed  CBC WITH DIFFERENTIAL/PLATELET - Abnormal; Notable for the following components:      Result Value   RBC 3.80 (*)    Hemoglobin 9.9 (*)    HCT 31.5 (*)    RDW 17.2 (*)    All other components within normal limits  BRAIN NATRIURETIC PEPTIDE - Abnormal; Notable for the following components:   B Natriuretic Peptide 872.3 (*)    All other components within normal limits  BASIC METABOLIC PANEL - Abnormal; Notable for the following components:   Glucose, Bld 194 (*)    BUN 55 (*)    Creatinine, Ser 12.34 (*)    GFR calc non Af Amer 3 (*)    GFR calc Af Amer 3 (*)    All other components within normal limits  HEPATIC FUNCTION PANEL - Abnormal; Notable for the following components:   AST 14 (*)    Bilirubin, Direct 0.3 (*)    All other components within normal limits  TROPONIN I (HIGH SENSITIVITY) - Abnormal; Notable for the following components:   Troponin I (High Sensitivity) 20 (*)    All other components within normal limits  SARS CORONAVIRUS 2 BY RT PCR (HOSPITAL ORDER, [place] [facility])  MAGNESIUM   ____________________________________________   ED ECG REPORT I, Vanessa [place], the attending physician, personally viewed and interpreted this ECG.  EKG sinus rate of 73, no ST elevation, no T wave inversions, QTC prolonged at 534` ____________________________________________  RADIOLOGY Robert Bellow, personally viewed and evaluated these images (plain radiographs) as part of my medical decision making, as well as reviewing the written report by the radiologist.  ED MD interpretation: Bilateral opacifications consistent with fluid  overload  Official radiology report(s): DG Chest Port 1 View  Result Date: 01/29/2020 CLINICAL DATA:  68 year old female with confusion, stroke-like  symptoms. Shortness of breath, hypoxia. On home oxygen. EXAM: PORTABLE CHEST 1 VIEW COMPARISON:  Portable chest 10/21/2019 and earlier. FINDINGS: Portable AP upright view at 0720 hours. Increased bilateral pulmonary interstitial opacity with basilar predominance. Stable mild cardiomegaly and mediastinal contours. Left chest wall loop recorder or ICD again noted. Visualized tracheal air column is within normal limits. No pneumothorax, pleural effusion or consolidation. No acute osseous abnormality identified. IMPRESSION: Increased bilateral pulmonary interstitial opacity with chronic cardiomegaly. Favor acute pulmonary edema over viral/atypical respiratory infection. Electronically Signed   By: Genevie Ann M.D.   On: 01/29/2020 07:47    ____________________________________________   PROCEDURES  Procedure(s) performed (including Critical Care):  Procedures   ____________________________________________   INITIAL IMPRESSION / ASSESSMENT AND PLAN / ED COURSE   Cynthia Dean was evaluated in Emergency Department on 01/29/2020 for the symptoms described in the history of present illness. She was evaluated in the context of the global COVID-19 pandemic, which necessitated consideration that the patient might be at risk for infection with the SARS-CoV-2 virus that causes COVID-19. Institutional protocols and algorithms that pertain to the evaluation of patients at risk for COVID-19 are in a state of rapid change based on information released by regulatory bodies including the CDC and federal and state organizations. These policies and algorithms were followed during the patient's care in the ED.     Pt presents with SOB.  Most likely secondary to fluid overload due to missing dialysis.  Report of concern for stroke with dialysis but I suspect that  she was not acting her normal self due to being significantly hypoxic given she was not on her home oxygen and o2 level was 70%.  At this point she has normal neuro exam and daughter feels like she is very close to baseline.  They deny any falls to suggest intracranial hemorrhage.  PNA-will get xray to evaluation Anemia-CBC to evaluate ACS- will get trops Arrhythmia-Will get EKG and keep on monitor.  COVID- will get testing per algorithm. PE-lower suspicion given no risk factors and other cause more likely  Chest x-ray consistent with pulmonary edema.  BNP is elevated from her baseline.  Hemoglobin is around baseline and troponin slightly elevated most likely secondary to ESRD.  Patient is currently on 3 L oxygen satting 92 to 93% at rest.  Given patient's chest x-ray, missing dialysis and hypertension patient will require admission for fluid overload and dialysis ____________________________________________   FINAL CLINICAL IMPRESSION(S) / ED DIAGNOSES   Final diagnoses:  ESRD (end stage renal disease) ([place])  Acute respiratory failure with hypoxia (H[facility])  Shortness of breath     MEDICATIONS GIVEN DURING THIS VISIT:  Medications - No data to display   ED Discharge Orders    None       Note:  This document was prepared using Dragon voice recognition software and may include unintentional dictation errors.   Vanessa Milan, MD 01/29/20 8295    Vanessa Morton, MD 01/29/20 [phone]

## 2020-01-29 NOTE — Progress Notes (Signed)
[facility], [place] 01/29/20  Subjective:   LOS: 0  Patient known to our practice from outpatient dialysis.  She presents to the ER with her daughter.  She reports she came because she was feeling weak all over.  She missed her dialysis treatment on Monday also because she was feeling weak.  There was also concern for strokelike symptoms, confusion and hypoxia with shortness of breath.  She had difficulty with word finding. Patient and her daughter state that her symptoms are now improving from the time they first came to the ER Last HD was last Friday.  Patient missed her treatment on Monday  Objective:  Vital signs in last 24 hours:  Temp:  [98.6 F (37 C)] 98.6 F (37 C) (07/07 0658) Pulse Rate:  [73] 73 (07/07 0658) Resp:  [22] 22 (07/07 0658) BP: (185)/(79) 185/79 (07/07 0658) SpO2:  [94 %] 94 % (07/07 0658) Weight:  [170 kg] 127 kg (07/07 0659)  Weight change:  Filed Weights   01/29/20 0659  Weight: 127 kg    Intake/Output:   No intake or output data in the 24 hours ending 01/29/20 0853   Physical Exam: General:  No acute distress, laying in the bed  HEENT  moist oral mucous membrane  Pulm/lungs  mild basilar crackles, [place] O2  CVS/Heart  no rub  Abdomen:   Soft, nontender  Extremities:  Trace edema  Neurologic:  Alert, able to answer questions, some dysphasia (baseline)  Skin:  Warm, dry  Access:  Left arm AV graft       Basic Metabolic Panel:  Recent Labs  Lab 01/29/20 0723  NA 136  K 4.9  CL 99  CO2 22  GLUCOSE 194*  BUN 55*  CREATININE 12.34*  CALCIUM 9.4  MG 1.8     CBC: Recent Labs  Lab 01/29/20 0723  WBC 7.7  NEUTROABS 5.6  HGB 9.9*  HCT 31.5*  MCV 82.9  PLT 169      Lab Results  Component Value Date   HEPBSAG NON REACTIVE 10/25/2019      Microbiology:  No results found for this or any previous visit (from the past 240 hour(s)).  Coagulation Studies: No results for input(s): LABPROT, INR in  the last 72 hours.  Urinalysis: No results for input(s): COLORURINE, LABSPEC, PHURINE, GLUCOSEU, HGBUR, BILIRUBINUR, KETONESUR, PROTEINUR, UROBILINOGEN, NITRITE, LEUKOCYTESUR in the last 72 hours.  Invalid input(s): APPERANCEUR    Imaging: DG Chest Port 1 View  Result Date: 01/29/2020 CLINICAL DATA:  69 year old female with confusion, stroke-like symptoms. Shortness of breath, hypoxia. On home oxygen. EXAM: PORTABLE CHEST 1 VIEW COMPARISON:  Portable chest 10/21/2019 and earlier. FINDINGS: Portable AP upright view at 0720 hours. Increased bilateral pulmonary interstitial opacity with basilar predominance. Stable mild cardiomegaly and mediastinal contours. Left chest wall loop recorder or ICD again noted. Visualized tracheal air column is within normal limits. No pneumothorax, pleural effusion or consolidation. No acute osseous abnormality identified. IMPRESSION: Increased bilateral pulmonary interstitial opacity with chronic cardiomegaly. Favor acute pulmonary edema over viral/atypical respiratory infection. Electronically Signed   By: Genevie Ann M.D.   On: 01/29/2020 07:47     Medications:    . Chlorhexidine Gluconate Cloth  6 each Topical Q0600     Assessment/ Plan:  69 y.o. 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 *  AMS  [facility]/MWF/[facility]/left arm AV graft/125 kg  #.  Volume overload with ESRD Patient comes in with mild volume overload.  Her symptoms of aphasia/difficulty with word finding have improved We will arrange for hemodialysis today as it is  her usual dialysis day. Volume removal of up to 2 kg as tolerated   #. Anemia of CKD  Lab Results  Component Value Date   HGB 9.9 (L) 01/29/2020   Low dose EPO with HD  #. Secondary hyperparathyroidism of renal origin N 25.81      Component Value Date/Time   PTH 131 (H) 03/14/2018 1949   Lab Results  Component Value Date   PHOS 3.0 10/25/2019   Monitor calcium and phos level during this admission   #. Diabetes type 2 with CKD Hemoglobin A1C (%)  Date Value  06/15/2014 7.9 (H)   Hgb A1c MFr Bld (%)  Date Value  07/31/2019 8.3 (H)     LOS: 0 Pharaoh Pio 7/7/20218:53 [place], [place]

## 2020-01-29 NOTE — Progress Notes (Signed)
Pt Assessed.

## 2020-01-29 NOTE — Progress Notes (Signed)
HD Started 

## 2020-01-29 NOTE — ED Notes (Signed)
Consent for dialysis signed by patient

## 2020-01-29 NOTE — ED Triage Notes (Signed)
Pt from [facility] with concern for stroke EMS reports negative stroke screen. Facility reports confusion. Pt reports shortness of breath, sat in 80's RA. Pt history of COPD normally on 2L, not on O2 during dialysis.   Pt is alert and oriented x3, disoriented to time. Pt SpO2 in 70's RA on arrival, 95% 5L [place]

## 2020-01-30 ENCOUNTER — Observation Stay: Payer: Medicare HMO

## 2020-01-30 DIAGNOSIS — J9621 Acute and chronic respiratory failure with hypoxia: Secondary | ICD-10-CM | POA: Diagnosis not present

## 2020-01-30 LAB — BASIC METABOLIC PANEL
Anion gap: 12 (ref 5–15)
BUN: 33 mg/dL — ABNORMAL HIGH (ref 8–23)
CO2: 29 mmol/L (ref 22–32)
Calcium: 8.9 mg/dL (ref 8.9–10.3)
Chloride: 97 mmol/L — ABNORMAL LOW (ref 98–111)
Creatinine, Ser: 8.36 mg/dL — ABNORMAL HIGH (ref 0.44–1.00)
GFR calc Af Amer: 5 mL/min — ABNORMAL LOW (ref >60)
GFR calc non Af Amer: 4 mL/min — ABNORMAL LOW (ref >60)
Glucose, Bld: 159 mg/dL — ABNORMAL HIGH (ref 70–99)
Potassium: 3.7 mmol/L (ref 3.5–5.1)
Sodium: 138 mmol/L (ref 135–145)

## 2020-01-30 LAB — CBC
HCT: 29.9 % — ABNORMAL LOW (ref 36.0–46.0)
Hemoglobin: 9.8 g/dL — ABNORMAL LOW (ref 12.0–15.0)
MCH: 26.5 pg (ref 26.0–34.0)
MCHC: 32.8 g/dL (ref 30.0–36.0)
MCV: 80.8 fL (ref 80.0–100.0)
Platelets: 170 10*3/uL (ref 150–400)
RBC: 3.7 MIL/uL — ABNORMAL LOW (ref 3.87–5.11)
RDW: 16.8 % — ABNORMAL HIGH (ref 11.5–15.5)
WBC: 6.7 10*3/uL (ref 4.0–10.5)
nRBC: 0 % (ref 0.0–0.2)

## 2020-01-30 LAB — GLUCOSE, CAPILLARY
Glucose-Capillary: 188 mg/dL — ABNORMAL HIGH (ref 70–99)
Glucose-Capillary: 115 mg/dL — ABNORMAL HIGH (ref 70–99)

## 2020-01-30 LAB — HEMOGLOBIN A1C
Hgb A1c MFr Bld: 7.5 % — ABNORMAL HIGH (ref 4.8–5.6)
Mean Plasma Glucose: 169 mg/dL

## 2020-01-30 LAB — MRSA PCR SCREENING: MRSA by PCR: NEGATIVE

## 2020-01-30 MED ORDER — ORAL CARE MOUTH RINSE: 15.0000 mL | Freq: Two times a day (BID) | OROMUCOSAL | Status: DC

## 2020-01-30 NOTE — Progress Notes (Signed)
   01/29/20 1816  ReDS Vest / Clip  BMI (Calculated) 41.99  Anatomical Comments NA due to BMI

## 2020-01-30 NOTE — Discharge Summary (Signed)
[facility]  Physician Discharge Summary   Patient ID: Cynthia Dean MRN: [id number] DOB/AGE: 1950-11-07 69 y.o.  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 Patient encouraged to keep up with her dialysis schedule    Home Health: None Equipment/Devices: None  CODE STATUS: Full code  DISCHARGE CONDITION: fair  Diet recommendation: As before  INITIAL HISTORY: Cynthia Dean is a 69 y.o. 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  Reportedly patient missed dialysis 2 days ago and was at dialysis this morning when staff noted increased work of breathing and worsening aphasia and confusion. Patient stated she "did not feel well" 2 days ago and therefore skipped dialysis. Associated symptoms include worsening LE edema.  Denies CP, palpitations, cough, fever, denies abdominal pain nausea vomiting diarrhea constipation.  Denies any worsening of her aphasia or right sided weakness.  Daughter was at the bedside who reported that she seemed to be at baseline.  The  boyfriend with whom she resides was contacted who also noted she seemed to be at baseline in terms of her mentation and aphasia.   Consultations:  Nephrology  Procedures:  Hemodialysis   HOSPITAL COURSE:   #1.  Acute on chronic respiratory failure with hypoxia.  Likely secondary to volume overload in the setting of noncompliance with dialysis.    Chest x-ray showed pulmonary edema.  Patient was saturating in the 70s in the emergency department.  She was seen by nephrology.  She was dialyzed.  With this her symptoms improved.  She seems to be back to her baseline.  She uses oxygen at home.     #2.  Acute metabolic encephalopathy/history of CVA with residual aphasia and right-sided weakness.  Sent to ED from dialysis with reportedly worsening aphasia.  Family felt that she was close to baseline.  Her encephalopathy could have been due to fluid overload and hypoxia.  MRI brain was done which does not show any acute infarcts.  No further work-up necessary at this time.  She may continue her usual home medication including her antiplatelet agents and statin.  #3.  End-stage renal disease.  She is a Monday Wednesday Friday dialysis patient.  She missed 1 dialysis session as noted above.  She was evaluated by nephrology in the emergency department.  Dialysis initiated prior to admission.    Discussed with nephrology today.  Okay for discharge.  She will need to resume her usual dialysis schedule starting tomorrow.  #4.  Essential Hypertension.  Blood pressure somewhat elevated on presentation.  Likely related to missed dialysis.  Home medications include Cozaar, metoprolol, torsemide.  These can be resumed.  #5. Diabetes  mellitus type II, with renal complications, ESRD: May resume her home medications.  HbA1c 7.5.  #6. CAD/hyperlipidemia.  Stable.  Continue with home medications.  #  7.  History of seizure disorder.  Continue home medications including Keppra.  No acute findings noted on  MRI.  #8.  Obesity.  BMI 41.98.  #9. GERD:   Overall stable.  Patient is back to her baseline from a mentation standpoint as well as from a respiratory standpoint.  She is on home oxygen.  She wants to go home.  Okay for discharge.   PERTINENT LABS:  The results of significant diagnostics from this hospitalization (including imaging, microbiology, ancillary and laboratory) are listed below for reference.    Microbiology: Recent Results (from the past 240 hour(s))  SARS Coronavirus 2 by RT PCR (hospital order, performed in [facility] hospital lab) Nasopharyngeal Nasopharyngeal Swab     Status: None   Collection Time: 01/29/20  7:23 AM   Specimen: Nasopharyngeal Swab  Result Value Ref Range Status   SARS Coronavirus 2 NEGATIVE NEGATIVE Final    Comment: (NOTE) SARS-CoV-2 target nucleic acids are NOT DETECTED.  The SARS-CoV-2 RNA is generally detectable in upper and lower respiratory specimens during the acute phase of infection. The lowest concentration of SARS-CoV-2 viral copies this assay can detect is 250 copies / mL. A negative result does not preclude SARS-CoV-2 infection and should not be used as the sole basis for treatment or other patient management decisions.  A negative result may occur with improper specimen collection / handling, submission of specimen other than nasopharyngeal swab, presence of viral mutation(s) within the areas targeted by this assay, and inadequate number of viral copies (<250 copies / mL). A negative result must be combined with clinical observations, patient history, and epidemiological information.  Fact Sheet for Patients:   StrictlyIdeas.no  Fact Sheet for Healthcare Providers: BankingDealers.co.za  This test is not yet approved or  cleared by the [place] FDA and has been authorized for detection and/or diagnosis of SARS-CoV-2 by FDA under an Emergency Use Authorization (EUA).  This  EUA will remain in effect (meaning this test can be used) for the duration of the COVID-19 declaration under Section 564(b)(1) of the Act, 21 U.S.C. section 360bbb-3(b)(1), unless the authorization is terminated or revoked sooner.  Performed at [facility], [address] [place], [place] [address]   MRSA PCR Screening     Status: None   Collection Time: 01/30/20  6:35 AM   Specimen: Nasopharyngeal  Result Value Ref Range Status   MRSA by PCR NEGATIVE NEGATIVE Final    Comment:        The GeneXpert MRSA Assay (FDA approved for NASAL specimens only), is one component of a comprehensive MRSA colonization surveillance program. It is not intended to diagnose MRSA infection nor to guide or monitor treatment for MRSA infections. Performed at [facility], [address] [place], [place] [address]      Labs:    Basic Metabolic Panel: Recent Labs  Lab 01/29/20 0723 01/29/20 1228 01/29/20 2045 01/30/20 0500  NA 136  --   --  138  K 4.9  --   --  3.7  CL 99  --   --  97*  CO2 22  --   --  29  GLUCOSE 194*  --   --  159*  BUN 55*  --   --  33*  CREATININE 12.34* 6.05* 7.68* 8.36*  CALCIUM 9.4  --   --  8.9  MG 1.8  --   --   --    Liver Function Tests: Recent Labs  Lab 01/29/20 [phone]  AST 14*  ALT 6  ALKPHOS 70  BILITOT 1.1  PROT 7.2  ALBUMIN 3.6   CBC: Recent Labs  Lab 01/29/20 0723 01/30/20 0500  WBC 7.7 6.7  NEUTROABS 5.6  --   HGB 9.9* 9.8*  HCT 31.5* 29.9*  MCV 82.9 80.8  PLT 169 170   BNP: BNP (last 3 results) Recent Labs    07/30/19 1957 01/29/20 0723  BNP 451.0* 872.3*    CBG: Recent Labs  Lab 01/29/20 1829 01/29/20 2149 01/30/20 0754 01/30/20 1226  GLUCAP 147* 159* 188* 115*     IMAGING STUDIES MR BRAIN WO CONTRAST  Result Date: 01/30/2020 CLINICAL DATA:  69 year old female dialysis patient with confusion, aphasia, shortness of breath. EXAM: MRI HEAD WITHOUT CONTRAST TECHNIQUE: Multiplanar, multiecho  pulse sequences of the brain and surrounding structures were obtained without intravenous contrast. COMPARISON:  Brain MRI 10/24/2019 and earlier. FINDINGS: Brain: No restricted diffusion to suggest acute infarction. No midline shift, mass effect, evidence of mass lesion, ventriculomegaly, extra-axial collection or acute intracranial hemorrhage. Cervicomedullary junction and pituitary are within normal limits. Numerous small chronic infarcts in both cerebellar hemispheres are stable since April. Stable chronic encephalomalacia in the anterior right MCA and posterior left MCA (left MCA/PCA watershed) areas. Stable other Patchy and confluent bilateral cerebral white matter T2 and FLAIR hyperintensity. Small chronic infarcts in the bilateral basal ganglia are stable. Chronic hemosiderin mostly in the MCA territories is stable. No new signal abnormality identified. Vascular: Major intracranial vascular flow voids are stable since April. Skull and upper cervical spine: Negative for age visible cervical spine. Stable bone marrow signal. Sinuses/Orbits: Stable, negative. Other: Mastoids remain clear. Visible internal auditory structures appear normal. Scalp and face soft tissues appear negative. IMPRESSION: No acute intracranial abnormality. Advanced chronic ischemic disease appears stable since April. Electronically Signed   By: Genevie Ann M.D.   On: 01/30/2020 12:41   DG Chest Port 1 View  Result Date: 01/29/2020 CLINICAL DATA:  69 year old female with confusion, stroke-like symptoms. Shortness of breath, hypoxia. On home oxygen. EXAM: PORTABLE CHEST 1 VIEW COMPARISON:  Portable chest 10/21/2019 and earlier. FINDINGS: Portable AP upright view at 0720 hours. Increased bilateral pulmonary interstitial opacity with basilar predominance. Stable mild cardiomegaly and mediastinal contours. Left chest wall loop recorder or ICD again noted. Visualized tracheal air column is within normal limits. No pneumothorax, pleural effusion  or consolidation. No acute osseous abnormality identified. IMPRESSION: Increased bilateral pulmonary interstitial opacity with chronic cardiomegaly. Favor acute pulmonary edema over viral/atypical respiratory infection. Electronically Signed   By: Genevie Ann M.D.   On: 01/29/2020 07:47   CUP PACEART REMOTE DEVICE CHECK  Result Date: 01/13/2020 [facility] summary report received. Battery status OK. Normal device function. No new symptom episodes, tachy episodes, brady, or pause episodes. No new AF episodes. Monthly summary reports and ROV/PRN. Felisa Bonier, RN, MSN   DISCHARGE EXAMINATION: Vitals:   01/29/20 2006 01/30/20 0458 01/30/20 0755 01/30/20 1234  BP: (!) 165/91 (!) 165/81 (!) 165/63 (!) 162/70  Pulse: 70 66 63 67  Resp: 20 16 18 18   Temp: 98.3 F (36.8 C) 98.4 F (36.9 C) 98.3 F (36.8 C) 97.6 F (36.4 C)  TempSrc:   Oral Oral  SpO2: 98% 99% 94% 98%  Weight:      Height:       General appearance: Awake alert.  In no distress Resp: Clear to auscultation bilaterally.  Normal effort Cardio: S1-S2 is normal regular.  No S3-S4.  No rubs murmurs or bruit GI: Abdomen  is soft.  Nontender nondistended.  Bowel sounds are present normal.  No masses organomegaly    DISPOSITION: Home  Discharge Instructions    Call MD for:  difficulty breathing, headache or visual disturbances   Complete by: As directed    Call MD for:  extreme fatigue   Complete by: As directed    Call MD for:  persistant dizziness or light-headedness   Complete by: As directed    Call MD for:  persistant nausea and vomiting   Complete by: As directed    Call MD for:  severe uncontrolled pain   Complete by: As directed    Call MD for:  temperature >100.4   Complete by: As directed    Diet - low sodium heart healthy   Complete by: As directed    Discharge instructions   Complete by: As directed    Please keep up with your dialysis schedule without missing any sessions.  The MRI brain did not show any acute  findings.  Please continue taking her medications as before.  You were cared for by a hospitalist during your hospital stay. If you have any questions about your discharge medications or the care you received while you were in the hospital after you are discharged, you can call the unit and asked to speak with the hospitalist on call if the hospitalist that took care of you is not available. Once you are discharged, your primary care physician will handle any further medical issues. Please note that NO REFILLS for any discharge medications will be authorized once you are discharged, as it is imperative that you return to your primary care physician (or establish a relationship with a primary care physician if you do not have one) for your aftercare needs so that they can reassess your need for medications and monitor your lab values. If you do not have a primary care physician, you can call [phone] for a physician referral.   Increase activity slowly   Complete by: As directed        Allergies as of 01/30/2020      Reactions   Hydrocodone    Intolerant more than allergic   Tape Itching   Skin Dermatitis/itching (tape adhesive) Skin Dermatitis/itching (tape adhesive)   Tapentadol Itching   Skin Dermatitis/itching (tape adhesive) Skin Dermatitis/itching (tape adhesive)      Medication List    TAKE these medications   aspirin EC 325 MG tablet Take 325 mg by mouth daily.   atorvastatin 80 MG tablet Commonly known as: LIPITOR Take 80 mg by mouth every evening.   calcium acetate 667 MG capsule Commonly known as: PHOSLO Take 3 capsules (2,001 mg total) by mouth 3 (three) times daily with meals.   clopidogrel 75 MG tablet Commonly known as: PLAVIX TAKE ONE TABLET BY MOUTH ONCE DAILY   docusate sodium 100 MG capsule Commonly known as: Colace Take 1 capsule (100 mg total) by mouth 2 (two) times daily.   fluticasone 50 MCG/ACT nasal spray Commonly known as: FLONASE 2 sprays by Each  Nare route daily. Prior to CPAP   gabapentin 100 MG capsule Commonly known as: Neurontin Take 1 capsule (100 mg total) by mouth at bedtime.   hydrocortisone 2.5 % rectal cream Commonly known as: ANUSOL-HC Place 1 application rectally 2 (two) times daily. What changed:   when to take this  reasons to take this   Ipratropium-Albuterol 20-100 MCG/ACT Aers respimat Commonly known as: COMBIVENT Inhale 1 puff into the lungs every  6 (six) hours.   levETIRAcetam 1000 MG tablet Commonly known as: KEPPRA Take 1 tablet (1,000 mg total) by mouth daily.   levETIRAcetam 250 MG tablet Commonly known as: KEPPRA Take 250 mg by mouth 3 (three) times a week.   lidocaine-prilocaine cream Commonly known as: EMLA Apply 1 application topically as needed (for port access).   losartan 100 MG tablet Commonly known as: COZAAR Take 100 mg by mouth daily. Takes in the morning   metoprolol succinate 100 MG 24 hr tablet Commonly known as: TOPROL-XL Take 100 mg by mouth daily. Take with or immediately following a meal.   omeprazole 20 MG capsule Commonly known as: PRILOSEC Take 20 mg by mouth 2 (two) times daily.   Sensipar 30 MG tablet Generic drug: cinacalcet Take 30 mg by mouth daily at 6 (six) AM. Take with largest meal   torsemide 100 MG tablet Commonly known as: DEMADEX Take 100 mg by mouth daily.   Vitamin D3 25 MCG tablet Commonly known as: Vitamin D Take 1 tablet (1,000 Units total) by mouth daily.         Follow-up Information    [name], Mliss Sax, MD. Go on 02/07/2020.   Specialty: Pediatrics Why: APPOINTMENT AT 3:15PM Contact information: [address] [facility] [place] [address] [phone]               TOTAL DISCHARGE TIME: 61 minutes  Bonnielee Haff  [facility] Pager on [email or web address]  01/30/2020, 3:37 PM

## 2020-01-30 NOTE — Progress Notes (Signed)
Hser Ellison Hughs to be D/C'd Home per MD order.  Discussed prescriptions and follow up appointments with the patient and boyfriend [name]. Medication list explained in detail. Pt verbalized understanding.  Allergies as of 01/30/2020      Reactions   Hydrocodone    Intolerant more than allergic   Tape Itching   Skin Dermatitis/itching (tape adhesive) Skin Dermatitis/itching (tape adhesive)   Tapentadol Itching   Skin Dermatitis/itching (tape adhesive) Skin Dermatitis/itching (tape adhesive)      Medication List    TAKE these medications   aspirin EC 325 MG tablet Take 325 mg by mouth daily.   atorvastatin 80 MG tablet Commonly known as: LIPITOR Take 80 mg by mouth every evening.   calcium acetate 667 MG capsule Commonly known as: PHOSLO Take 3 capsules (2,001 mg total) by mouth 3 (three) times daily with meals.   clopidogrel 75 MG tablet Commonly known as: PLAVIX TAKE ONE TABLET BY MOUTH ONCE DAILY   docusate sodium 100 MG capsule Commonly known as: Colace Take 1 capsule (100 mg total) by mouth 2 (two) times daily.   fluticasone 50 MCG/ACT nasal spray Commonly known as: FLONASE 2 sprays by Each Nare route daily. Prior to CPAP   gabapentin 100 MG capsule Commonly known as: Neurontin Take 1 capsule (100 mg total) by mouth at bedtime.   hydrocortisone 2.5 % rectal cream Commonly known as: ANUSOL-HC Place 1 application rectally 2 (two) times daily. What changed:   when to take this  reasons to take this   Ipratropium-Albuterol 20-100 MCG/ACT Aers respimat Commonly known as: COMBIVENT Inhale 1 puff into the lungs every 6 (six) hours.   levETIRAcetam 1000 MG tablet Commonly known as: KEPPRA Take 1 tablet (1,000 mg total) by mouth daily.   levETIRAcetam 250 MG tablet Commonly known as: KEPPRA Take 250 mg by mouth 3 (three) times a week.   lidocaine-prilocaine cream Commonly known as: EMLA Apply 1 application topically as needed (for port access).    losartan 100 MG tablet Commonly known as: COZAAR Take 100 mg by mouth daily. Takes in the morning   metoprolol succinate 100 MG 24 hr tablet Commonly known as: TOPROL-XL Take 100 mg by mouth daily. Take with or immediately following a meal.   omeprazole 20 MG capsule Commonly known as: PRILOSEC Take 20 mg by mouth 2 (two) times daily.   Sensipar 30 MG tablet Generic drug: cinacalcet Take 30 mg by mouth daily at 6 (six) AM. Take with largest meal   torsemide 100 MG tablet Commonly known as: DEMADEX Take 100 mg by mouth daily.   Vitamin D3 25 MCG tablet Commonly known as: Vitamin D Take 1 tablet (1,000 Units total) by mouth daily.       Vitals:   01/30/20 0755 01/30/20 1234  BP: (!) 165/63 (!) 162/70  Pulse: 63 67  Resp: 18 18  Temp: 98.3 F (36.8 C) 97.6 F (36.4 C)  SpO2: 94% 98%    Tele box removed and returned. Skin clean, dry and intact without evidence of skin break down, no evidence of skin tears noted. IV catheter discontinued intact. Site without signs and symptoms of complications. Dressing and pressure applied. Pt denies pain at this time. No complaints noted.  An After Visit Summary was printed and given to the patient. Patient escorted via [place], and D/C home via private auto.  Rolley Sims

## 2020-02-17 ENCOUNTER — Ambulatory Visit (INDEPENDENT_AMBULATORY_CARE_PROVIDER_SITE_OTHER): Payer: Medicare HMO | Admitting: *Deleted

## 2020-02-17 DIAGNOSIS — I6310 Cerebral infarction due to embolism of unspecified precerebral artery: Secondary | ICD-10-CM | POA: Diagnosis not present

## 2020-02-17 LAB — CUP PACEART REMOTE DEVICE CHECK
Date Time Interrogation Session: 20210725232921
Implantable Pulse Generator Implant Date: 20190425

## 2020-02-20 NOTE — Progress Notes (Signed)
[facility]link Summary Report / Loop Recorder 

## 2020-03-23 ENCOUNTER — Ambulatory Visit (INDEPENDENT_AMBULATORY_CARE_PROVIDER_SITE_OTHER): Payer: Medicare HMO | Admitting: *Deleted

## 2020-03-23 DIAGNOSIS — I6310 Cerebral infarction due to embolism of unspecified precerebral artery: Secondary | ICD-10-CM

## 2020-03-24 LAB — CUP PACEART REMOTE DEVICE CHECK
Date Time Interrogation Session: 20210827233843
Implantable Pulse Generator Implant Date: 20190425

## 2020-03-25 NOTE — Progress Notes (Signed)
[facility]link Summary Report / Loop Recorder 

## 2020-04-14 ENCOUNTER — Ambulatory Visit: Payer: Medicare HMO | Attending: Neurology | Admitting: Speech Pathology

## 2020-04-14 ENCOUNTER — Other Ambulatory Visit: Payer: Self-pay

## 2020-04-14 DIAGNOSIS — R41841 Cognitive communication deficit: Secondary | ICD-10-CM | POA: Insufficient documentation

## 2020-04-14 DIAGNOSIS — R4701 Aphasia: Secondary | ICD-10-CM | POA: Insufficient documentation

## 2020-04-15 ENCOUNTER — Encounter: Payer: Self-pay | Admitting: Speech Pathology

## 2020-04-15 ENCOUNTER — Other Ambulatory Visit: Payer: Self-pay

## 2020-04-15 NOTE — Therapy (Signed)
[place] MAIN [facility] SERVICES [address] [place], [place], [address] Phone: [phone]   Fax:  [phone]  Speech Language Pathology Evaluation  Patient Details  Name: Cynthia Dean MRN: [id number] Date of Birth: 1950/09/09 Referring Provider (SLP): Dr. Melrose Nakayama   Encounter Date: 04/14/2020   End of Session - 04/15/20 1512    Visit Number 1    Number of Visits 17    Date for SLP Re-Evaluation 06/11/20    Authorization Type Medicare    Authorization Time Period Start 04/14/2020    Authorization - Visit Number 1    Progress Note Due on Visit 10    SLP Start Time 1600    SLP Stop Time  1700    SLP Time Calculation (min) 60 min    Activity Tolerance Patient tolerated treatment well           Past Medical History:  Diagnosis Date  . Acute on chronic respiratory failure with hypoxia ([place]) 07/30/2019  . Acute respiratory failure with hypoxia ([place]) 12/31/2018  . Carotid arterial disease ([place])    a. 02/2018 < bilat ICA stenosis.  [name] COPD (chronic obstructive pulmonary disease) ([place])   . Coronary artery disease    a. 11/2015 MV: EF 57%, no ischemia/infarct.  . Cryptogenic stroke ([place])    a. 10/2017 s/p implantable loop recorder (no Afib to date).  . Diabetes mellitus without complication ([place])   . Diarrhea   . ESRD on dialysis ([place])   . GI bleed    a. 01/2019 EGD/Colonoscopy: Mild gastritis. Colitis.  [name] Heart murmur    a. 12/2018 Echo: EF 55-60%, impaired relaxation. Mod dil LA. Mildly dil RA. Mild Ca2+ of AoV.  [name] Hematemesis 06/27/2017  . History of 2019 novel coronavirus disease (COVID-19) 07/30/2019  . Hyperkalemia   . Hyperlipidemia   . Hypertension   . Intractable nausea and vomiting 08/08/2015  . Nausea & vomiting 05/30/2018  . Nausea and vomiting 05/29/2018  . Pneumonia due to COVID-19 virus     Past Surgical History:  Procedure Laterality Date  . A/V FISTULAGRAM Left 12/20/2016   Procedure: A/V Fistulagram;  Surgeon:  Katha Cabal, MD;  Location: [place] [facility];  Service: Cardiovascular;  Laterality: Left;  . A/V SHUNT INTERVENTION N/A 12/20/2016   Procedure: A/V Shunt Intervention;  Surgeon: Katha Cabal, MD;  Location: [place] [facility];  Service: Cardiovascular;  Laterality: N/A;  . A/V SHUNTOGRAM Left 09/11/2017   Procedure: A/V SHUNTOGRAM;  Surgeon: Algernon Huxley, MD;  Location: [place] [facility];  Service: Cardiovascular;  Laterality: Left;  . A/V SHUNTOGRAM Left 11/21/2019   Procedure: A/V SHUNTOGRAM;  Surgeon: Algernon Huxley, MD;  Location: [place] [facility];  Service: Cardiovascular;  Laterality: Left;  . BREAST BIOPSY Bilateral 07/19/00   neg  . COLONOSCOPY WITH PROPOFOL N/A 02/16/2019   Procedure: COLONOSCOPY WITH PROPOFOL;  Surgeon: Toledo, Benay Pike, MD;  Location: [facility];  Service: Gastroenterology;  Laterality: N/A;  . ESOPHAGOGASTRODUODENOSCOPY (EGD) WITH PROPOFOL N/A 02/16/2019   Procedure: ESOPHAGOGASTRODUODENOSCOPY (EGD) WITH PROPOFOL;  Surgeon: Toledo, Benay Pike, MD;  Location: [facility];  Service: Gastroenterology;  Laterality: N/A;  . LOOP RECORDER INSERTION N/A 11/16/2017   Procedure: LOOP RECORDER INSERTION;  Surgeon: Deboraha Sprang, MD;  Location: [place] [facility];  Service: Cardiovascular;  Laterality: N/A;  . PERIPHERAL VASCULAR CATHETERIZATION Left 02/02/2015   Procedure: A/V Shuntogram/Fistulagram;  Surgeon: Algernon Huxley, MD;  Location: [place] [facility];  Service: Cardiovascular;  Laterality: Left;  . PERIPHERAL VASCULAR CATHETERIZATION Left 02/02/2015   Procedure: A/V Shunt Intervention;  Surgeon: Algernon Huxley, MD;  Location: [place] [facility];  Service: Cardiovascular;  Laterality: Left;  . PERIPHERAL VASCULAR CATHETERIZATION Left 03/09/2015   Procedure: A/V Shuntogram/Fistulagram;  Surgeon: Algernon Huxley, MD;  Location: [place] [facility];  Service: Cardiovascular;  Laterality: Left;  . PERIPHERAL VASCULAR CATHETERIZATION N/A 03/09/2015    Procedure: A/V Shunt Intervention;  Surgeon: Algernon Huxley, MD;  Location: [place] [facility];  Service: Cardiovascular;  Laterality: N/A;  . PERIPHERAL VASCULAR CATHETERIZATION Left 04/17/2015   Procedure: Upper Extremity Angiography;  Surgeon: Algernon Huxley, MD;  Location: [place] [facility];  Service: Cardiovascular;  Laterality: Left;  . PERIPHERAL VASCULAR CATHETERIZATION  04/17/2015   Procedure: Upper Extremity Intervention;  Surgeon: Algernon Huxley, MD;  Location: [place] [facility];  Service: Cardiovascular;;  . PERIPHERAL VASCULAR CATHETERIZATION N/A 08/10/2015   Procedure: A/V Shuntogram/Fistulagram;  Surgeon: Algernon Huxley, MD;  Location: [place] [facility];  Service: Cardiovascular;  Laterality: N/A;  . PERIPHERAL VASCULAR CATHETERIZATION N/A 08/10/2015   Procedure: A/V Shunt Intervention;  Surgeon: Algernon Huxley, MD;  Location: [place] [facility];  Service: Cardiovascular;  Laterality: N/A;  . PERIPHERAL VASCULAR CATHETERIZATION Left 04/11/2016   Procedure: A/V Shuntogram/Fistulagram;  Surgeon: Algernon Huxley, MD;  Location: [place] [facility];  Service: Cardiovascular;  Laterality: Left;  . TEE WITHOUT CARDIOVERSION N/A 11/16/2017   Procedure: TRANSESOPHAGEAL ECHOCARDIOGRAM (TEE);  Surgeon: Minna Merritts, MD;  Location: [facility];  Service: Cardiovascular;  Laterality: N/A;    There were no vitals filed for this visit.       SLP Evaluation [facility] - 04/15/20 0001      SLP Visit Information   SLP Received On 04/15/20    Referring Provider (SLP) Dr. Melrose Nakayama    Onset Date 02/21/2020    Medical Diagnosis CVA      Subjective   Subjective Patient and partner state that speaking has increased in difficulty since her most previous stroke. Patient states that her memory has changed as well.     Patient/Family Stated Goal Improved reading and following directions, such as in cooking. To decrease frustration with communication overall.       General Information   HPI SW is a 69 year old  woman with a history of cryptogenic stroke and stable advanced chronic ischemic disease. The patient has never received speech therapy, and has only experienced a noticeable change in speech and overall cognition since her most recent stroke.       Prior Functional Status   Cognitive/Linguistic Baseline Within functional limits      Cognition   Overall Cognitive Status Impaired/Different from baseline      Auditory Comprehension   Overall Auditory Comprehension Impaired      Reading Comprehension   Reading Status Impaired      Expression   Primary Mode of Expression Verbal      Verbal Expression   Overall Verbal Expression Impaired      Oral Motor/Sensory Function   Overall Oral Motor/Sensory Function Impaired      Motor Speech   Overall Motor Speech Impaired    Respiration Impaired    Level of Impairment Sentence    Phonation Normal    Resonance Within functional limits    Articulation Within functional limitis    Intelligibility Intelligible      Standardized Assessments   Standardized Assessments  Western  Aphasia Battery - Screening          Oral Motor/ Sensory Function:  Overall Oral Motor/ Sensory Function: Impaired Labial ROM: Within Functional Limits Labial Symmetry: Within Functional Limits Labial Sensation: Within Functional Limits Labial Coordination: Reduced Lingual ROM: Reduced, lack of protrusion Lingual Symmetry: Within Functional Limits Lingual Sensation: Within Functional Limits Lingual Coordination: Reduced Facial ROM: Within Functional Limits Facial Symmetry: Within Functional Limits Facial Sensation: Within Functional Limits Facial Coordination: Within Functional Limits Velum: Within Functional Limits Mandible: Within Functional Limits Diadochokinetic rates: uneven   Motor Speech: Respiration: Impaired Vocal Quality: WNL s/z ratio: .9 Western Aphasia Battery- Screening  Spontaneous Speech                       Information content                                    7/10                                                       Fluency                                                     6/10                                                     Comprehension     Yes/No questions                                     9/10                                                                        Sequential Commands                           3/10                                     Repetition                                                  10/10  Naming    Object Naming                                         10/10                                                                                Screening Aphasia Quotient                  75/100   Reading                                                      8/10 (When allowed to have text to answer questions) Writing                                                         8/10 Screening Language Quotient              76.25/100     SLP Education - 04/15/20 1508    Education Details Results, recommendations, POC    Person(s) Educated Patient;Spouse    Methods Explanation    Comprehension Verbalized understanding              SLP Long Term Goals - 04/15/20 1515      SLP LONG TERM GOAL #1   Title Patient will generate grammatical and cogent sentence to complete simple/concrete linguistic task with 80% accuracy.    Time 8    Period Weeks    Status New    Target Date 06/11/20      SLP LONG TERM GOAL #2   Title Patient will complete multi-unit processing tasks with 80% accuracy without the need of repetition of task instructions or significant delays in responding.    Time 8    Period Weeks    Status New    Target Date 06/04/20      SLP LONG TERM GOAL #3   Title Patient complete functional reading tasks with 80% accuracy.    Time 8    Period Weeks    Status New    Target Date 06/11/20      SLP LONG TERM GOAL #4   Title Patient  complete functional writing tasks with 80% accuracy.    Time 8    Period Weeks    Status New    Target Date 06/11/20      SLP LONG TERM GOAL #5   Title Patient will demonstrate functional cognitive-communication skills for independent completion of personal responsibilities and leisure activities.    Time 8    Period Weeks    Status New    Target Date 06/11/20            Plan - 04/15/20 1514    Clinical Impression  Statement This 69 year old woman; with history of cryptogenic stroke is presenting with moderate cognitive-linguistic impairment characterized by impairment of speech content, fluency, sequential commands, and reading comprehension. Patient demonstrated some semantic paraphasias (ex. bag for grocery cart) when describing the provided image. Throughout the evaluation, the patient required redirection and repetition of instructions to increase performance on tasks. The patient experienced prolonged response time, especially when asked to answer questions or describe a scene. When answering questions from a short paragraph, patient utilized the paragraph to answer 4/5 questions. The results of the Western Aphasia Battery Revised (WAB-R) screener indicate a moderate bedside aphasia score and a mild bedside language score. These scores, and information from both the patient and her partner, indicate a decline in cognitive-linguistic abilities since her last stroke. Both the patient and her communication partner report more difficulty at home since her last stroke, and she will benefit from skilled speech therapy for restorative and compensatory treatment of cognitive communication deficits.    Speech Therapy Frequency 2x / week    Duration 8 weeks    Treatment/Interventions SLP instruction and feedback;Language facilitation;Patient/family education    Potential to Achieve Goals Good    Potential Considerations Ability to learn/carryover information;Previous level of  function;Co-morbidities;Severity of impairments;Cooperation/participation level;Medical prognosis;Family/community support    Consulted and Agree with Plan of Care Patient;Family member/caregiver    Family Member Consulted Partner           Patient will benefit from skilled therapeutic intervention in order to improve the following deficits and impairments:   Cognitive communication deficit - Plan: SLP plan of care cert/re-cert    Problem List Patient Active Problem List   Diagnosis Date Noted  . Delay kidney tx func d/t fluid overload requiring acute dialysis ([place]) 01/29/2020  . Delay kidney tx func d/t ATN and fluid overload require acute dialysis ([place]) 01/29/2020  . Hypoglycemia 10/21/2019  . Unresponsiveness 10/21/2019  . GERD (gastroesophageal reflux disease) 10/21/2019  . Hypothermia   . Sepsis with encephalopathy without septic shock ([place])   . Acute metabolic encephalopathy   . Acute on chronic diastolic CHF (congestive heart failure) ([place])   . Acute on chronic respiratory failure with hypoxia ([place]) 07/30/2019  . Rectal bleed 02/13/2019  . Acute respiratory failure with hypoxia ([place]) 12/31/2018  . Aphasia as late effect of cerebrovascular accident (CVA) 05/01/2018  . Seizure ([place]) 03/14/2018  . Stroke ([place]) 03/07/2018  . Slurred speech 11/14/2017  . 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[place]) 11/12/2017  . CAD (coronary artery disease) 11/12/2017  . COPD (chronic obstructive pulmonary disease) ([place]) 11/12/2017  . ESRD (end stage renal disease) ([place]) 11/12/2017  . CVA (cerebral vascular accident) ([place]) 04/14/2016  . Essential hypertension 11/27/2015  . Hyperlipidemia 11/27/2015  . Prolonged Q-T interval on ECG 11/26/2015  . Aphasia complicating stroke 17/00/1749  . Cervical spine arthritis 07/27/2015  . Diabetic retinopathy without macular edema associated with type 2 diabetes mellitus ([place]) 07/27/2015  . Osteoarthritis of spine with  radiculopathy, lumbar region 07/27/2015  . Obesity, Class III, BMI 40-49.9 (morbid obesity) ([place]) 07/27/2015  . Vitamin D deficiency 01/21/2011  . Diastolic heart failure ([place]) 10/27/2010   Leroy Sea, MS/CCC- SLP  Lou Miner 04/15/2020, 3:22 PM  [place] MAIN [facility] SERVICES [address] [place], [place], [address] Phone: [phone]   Fax:  [phone]  Name: Cynthia Dean MRN: [id number] Date of Birth: 1951-01-29

## 2020-04-21 ENCOUNTER — Ambulatory Visit: Payer: Medicare HMO | Admitting: Speech Pathology

## 2020-04-21 ENCOUNTER — Other Ambulatory Visit: Payer: Self-pay

## 2020-04-21 DIAGNOSIS — R4701 Aphasia: Secondary | ICD-10-CM

## 2020-04-21 DIAGNOSIS — R41841 Cognitive communication deficit: Secondary | ICD-10-CM | POA: Diagnosis not present

## 2020-04-22 ENCOUNTER — Encounter: Payer: Self-pay | Admitting: Speech Pathology

## 2020-04-22 NOTE — Therapy (Signed)
[place] MAIN [facility] SERVICES [address] [place], [place], [address] Phone: [phone]   Fax:  [phone]  Speech Language Pathology Treatment  Patient Details  Name: Cynthia Dean MRN: [id number] Date of Birth: 06/25/1951 Referring Provider (SLP): Dr. Melrose Nakayama   Encounter Date: 04/21/2020   End of Session - 04/22/20 0820    Visit Number 2    Number of Visits 17    Date for SLP Re-Evaluation 06/11/20    Authorization Type Medicare    Authorization Time Period Start 04/14/2020    Authorization - Visit Number 2    Authorization - Number of Visits 10    Progress Note Due on Visit 10    SLP Start Time 1600    SLP Stop Time  1650    SLP Time Calculation (min) 50 min    Activity Tolerance Patient tolerated treatment well           Past Medical History:  Diagnosis Date  . Acute on chronic respiratory failure with hypoxia ([place]) 07/30/2019  . Acute respiratory failure with hypoxia ([place]) 12/31/2018  . Carotid arterial disease ([place])    a. 02/2018 < bilat ICA stenosis.  [name] COPD (chronic obstructive pulmonary disease) ([place])   . Coronary artery disease    a. 11/2015 MV: EF 57%, no ischemia/infarct.  . Cryptogenic stroke ([place])    a. 10/2017 s/p implantable loop recorder (no Afib to date).  . Diabetes mellitus without complication ([place])   . Diarrhea   . ESRD on dialysis ([place])   . GI bleed    a. 01/2019 EGD/Colonoscopy: Mild gastritis. Colitis.  [name] Heart murmur    a. 12/2018 Echo: EF 55-60%, impaired relaxation. Mod dil LA. Mildly dil RA. Mild Ca2+ of AoV.  [name] Hematemesis 06/27/2017  . History of 2019 novel coronavirus disease (COVID-19) 07/30/2019  . Hyperkalemia   . Hyperlipidemia   . Hypertension   . Intractable nausea and vomiting 08/08/2015  . Nausea & vomiting 05/30/2018  . Nausea and vomiting 05/29/2018  . Pneumonia due to COVID-19 virus     Past Surgical History:  Procedure Laterality Date  . A/V FISTULAGRAM Left 12/20/2016    Procedure: A/V Fistulagram;  Surgeon: Katha Cabal, MD;  Location: [place] [facility];  Service: Cardiovascular;  Laterality: Left;  . A/V SHUNT INTERVENTION N/A 12/20/2016   Procedure: A/V Shunt Intervention;  Surgeon: Katha Cabal, MD;  Location: [place] [facility];  Service: Cardiovascular;  Laterality: N/A;  . A/V SHUNTOGRAM Left 09/11/2017   Procedure: A/V SHUNTOGRAM;  Surgeon: Algernon Huxley, MD;  Location: [place] [facility] LAB;  Service: Cardiovascular;  Laterality: Left;  . A/V SHUNTOGRAM Left 11/21/2019   Procedure: A/V SHUNTOGRAM;  Surgeon: Algernon Huxley, MD;  Location: [place] CV LAB;  Service: Cardiovascular;  Laterality: Left;  . BREAST BIOPSY Bilateral 07/19/00   neg  . COLONOSCOPY WITH PROPOFOL N/A 02/16/2019   Procedure: COLONOSCOPY WITH PROPOFOL;  Surgeon: Toledo, Benay Pike, MD;  Location: [facility] END[facility]COPY;  Service: Gastroenterology;  Laterality: N/A;  . ESOPHAGOGASTRODUODENOSCOPY (EGD) WITH PROPOFOL N/A 02/16/2019   Procedure: ESOPHAGOGASTRODUODENOSCOPY (EGD) WITH PROPOFOL;  Surgeon: Toledo, Benay Pike, MD;  Location: [facility];  Service: Gastroenterology;  Laterality: N/A;  . LOOP RECORDER INSERTION N/A 11/16/2017   Procedure: LOOP RECORDER INSERTION;  Surgeon: Deboraha Sprang, MD;  Location: [place] [facility] LAB;  Service: Cardiovascular;  Laterality: N/A;  . PERIPHERAL VASCULAR CATHETERIZATION Left 02/02/2015   Procedure: A/V Shuntogram/Fistulagram;  Surgeon: Corene Cornea  Bunnie Domino, MD;  Location: [place] [facility] LAB;  Service: Cardiovascular;  Laterality: Left;  . PERIPHERAL VASCULAR CATHETERIZATION Left 02/02/2015   Procedure: A/V Shunt Intervention;  Surgeon: Algernon Huxley, MD;  Location: [place] [facility] LAB;  Service: Cardiovascular;  Laterality: Left;  . PERIPHERAL VASCULAR CATHETERIZATION Left 03/09/2015   Procedure: A/V Shuntogram/Fistulagram;  Surgeon: Algernon Huxley, MD;  Location: [place] [facility] LAB;  Service: Cardiovascular;  Laterality: Left;  . PERIPHERAL  VASCULAR CATHETERIZATION N/A 03/09/2015   Procedure: A/V Shunt Intervention;  Surgeon: Algernon Huxley, MD;  Location: [place] [facility] LAB;  Service: Cardiovascular;  Laterality: N/A;  . PERIPHERAL VASCULAR CATHETERIZATION Left 04/17/2015   Procedure: Upper Extremity Angiography;  Surgeon: Algernon Huxley, MD;  Location: [place] [facility] LAB;  Service: Cardiovascular;  Laterality: Left;  . PERIPHERAL VASCULAR CATHETERIZATION  04/17/2015   Procedure: Upper Extremity Intervention;  Surgeon: Algernon Huxley, MD;  Location: [place] [facility] LAB;  Service: Cardiovascular;;  . PERIPHERAL VASCULAR CATHETERIZATION N/A 08/10/2015   Procedure: A/V Shuntogram/Fistulagram;  Surgeon: Algernon Huxley, MD;  Location: [place] [facility] LAB;  Service: Cardiovascular;  Laterality: N/A;  . PERIPHERAL VASCULAR CATHETERIZATION N/A 08/10/2015   Procedure: A/V Shunt Intervention;  Surgeon: Algernon Huxley, MD;  Location: [place] [facility] LAB;  Service: Cardiovascular;  Laterality: N/A;  . PERIPHERAL VASCULAR CATHETERIZATION Left 04/11/2016   Procedure: A/V Shuntogram/Fistulagram;  Surgeon: Algernon Huxley, MD;  Location: [place] [facility];  Service: Cardiovascular;  Laterality: Left;  . TEE WITHOUT CARDIOVERSION N/A 11/16/2017   Procedure: TRANSESOPHAGEAL ECHOCARDIOGRAM (TEE);  Surgeon: Minna Merritts, MD;  Location: [facility];  Service: Cardiovascular;  Laterality: N/A;    There were no vitals filed for this visit.   Subjective Assessment - 04/22/20 0818    Subjective distractible throughout session, "that's good"                 ADULT SLP TREATMENT - 04/22/20 0001      General Information   Behavior/Cognition Distractible      Treatment Provided   Treatment provided Cognitive-Linquistic      Cognitive-Linquistic Treatment   Treatment focused on Aphasia    Skilled Treatment COMPREHENSION: Patient answered yes/no questions about word class/ semantic features with 90% acc, demonstrating understanding when correct answers were  explained. Patient identified the correct written word in a field of 2 given verbalization from clinician and an image of the word with 100% acc, requiring repetition of instructions to identify the word and not the image. Patient matched written words to objects given clinician verbalization. Followed 1 step commands given verbalization only with 40% acc independently, and given written support in addition to verbalization with 30% acc independently, increasing to 90% acc when redirected to the task and given mod cues in the form of gestures. WRITING: When given sentence starters, with 3 options and then with no options, patient completed sentences independently with 90% acc, increasing in accuracy when given ideas from clinician or spelling instructions. Overall, patient read each starter with 100% acc and writes legibly.       Assessment / Recommendations / Plan   Plan Continue with current plan of care      Progression Toward Goals   Progression toward goals Progressing toward goals            SLP Education - 04/22/20 0819    Education Details repeating instructions    Person(s) Educated Patient    Methods Explanation    Comprehension  Verbalized understanding;Returned demonstration;Verbal cues required              SLP Long Term Goals - 04/15/20 1515      SLP LONG TERM GOAL #1   Title Patient will generate grammatical and cogent sentence to complete simple/concrete linguistic task with 80% accuracy.    Time 8    Period Weeks    Status New    Target Date 06/11/20      SLP LONG TERM GOAL #2   Title Patient will complete multi-unit processing tasks with 80% accuracy without the need of repetition of task instructions or significant delays in responding.    Time 8    Period Weeks    Status New    Target Date 06/04/20      SLP LONG TERM GOAL #3   Title Patient complete functional reading tasks with 80% accuracy.    Time 8    Period Weeks    Status New    Target Date  06/11/20      SLP LONG TERM GOAL #4   Title Patient complete functional writing tasks with 80% accuracy.    Time 8    Period Weeks    Status New    Target Date 06/11/20      SLP LONG TERM GOAL #5   Title Patient will demonstrate functional cognitive-communication skills for independent completion of personal responsibilities and leisure activities.    Time 8    Period Weeks    Status New    Target Date 06/11/20            Plan - 04/22/20 0820    Clinical Impression Statement Patient often requires redirection to the task at hand, responsive to prompts like "where is it" in object identification. Continue to target writing given options or as responses to questions from clinician. Have patient label objects prior to verbal/ written commands, encouraging her to identify objects before performing the required action. Target increasingly complex yes/ no questions. Introduce barrier task for spontaneous speech production and conversation.    Speech Therapy Frequency 2x / week    Duration 8 weeks    Treatment/Interventions SLP instruction and feedback;Language facilitation;Patient/family education    Potential to Achieve Goals Good    Potential Considerations Ability to learn/carryover information;Previous level of function;Co-morbidities;Severity of impairments;Cooperation/participation level;Medical prognosis;Family/community support    Consulted and Agree with Plan of Care Patient;Family member/caregiver           Patient will benefit from skilled therapeutic intervention in order to improve the following deficits and impairments:   Aphasia    Problem List Patient Active Problem List   Diagnosis Date Noted  . Delay kidney tx func d/t fluid overload requiring acute dialysis ([place]) 01/29/2020  . Delay kidney tx func d/t ATN and fluid overload require acute dialysis ([place]) 01/29/2020  . Hypoglycemia 10/21/2019  . Unresponsiveness 10/21/2019  . GERD (gastroesophageal reflux  disease) 10/21/2019  . Hypothermia   . Sepsis with encephalopathy without septic shock ([place])   . Acute metabolic encephalopathy   . Acute on chronic diastolic CHF (congestive heart failure) ([place])   . Acute on chronic respiratory failure with hypoxia ([place]) 07/30/2019  . Rectal bleed 02/13/2019  . Acute respiratory failure with hypoxia ([place]) 12/31/2018  . Aphasia as late effect of cerebrovascular accident (CVA) 05/01/2018  . Seizure ([place]) 03/14/2018  . Stroke ([place]) 03/07/2018  . Slurred speech 11/14/2017  . 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[place]) 11/12/2017  .  CAD (coronary artery disease) 11/12/2017  . COPD (chronic obstructive pulmonary disease) ([place]) 11/12/2017  . ESRD (end stage renal disease) ([place]) 11/12/2017  . CVA (cerebral vascular accident) ([place]) 04/14/2016  . Essential hypertension 11/27/2015  . Hyperlipidemia 11/27/2015  . Prolonged Q-T interval on ECG 11/26/2015  . Aphasia complicating stroke 92/49/3241  . Cervical spine arthritis 07/27/2015  . Diabetic retinopathy without macular edema associated with type 2 diabetes mellitus ([place]) 07/27/2015  . Osteoarthritis of spine with radiculopathy, lumbar region 07/27/2015  . Obesity, Class III, BMI 40-49.9 (morbid obesity) ([place]) 07/27/2015  . Vitamin D deficiency 01/21/2011  . Diastolic heart failure ([place]) 10/27/2010   Lawernce Pitts, BS Graduate Clinician Lawernce Pitts 04/22/2020, 8:21 AM  [place] MAIN [facility] SERVICES [address] [place], [place], [address] Phone: [phone]   Fax:  [phone]   Name: Shaquayla Klimas MRN: [id number] Date of Birth: 11/18/1950

## 2020-04-23 ENCOUNTER — Other Ambulatory Visit: Payer: Self-pay

## 2020-04-23 ENCOUNTER — Encounter: Payer: Self-pay | Admitting: Speech Pathology

## 2020-04-23 ENCOUNTER — Ambulatory Visit: Payer: Medicare HMO | Admitting: Speech Pathology

## 2020-04-23 DIAGNOSIS — R41841 Cognitive communication deficit: Secondary | ICD-10-CM | POA: Diagnosis not present

## 2020-04-23 DIAGNOSIS — R4701 Aphasia: Secondary | ICD-10-CM

## 2020-04-23 NOTE — Therapy (Signed)
[place] MAIN [facility] SERVICES [address] [place], [place], [address] Phone: [phone]   Fax:  [phone]  Speech Language Pathology Treatment  Patient Details  Name: Cynthia Dean MRN: [id number] Date of Birth: 08-Feb-1951 Referring Provider (SLP): Dr. Melrose Nakayama   Encounter Date: 04/23/2020   End of Session - 04/23/20 1750    Visit Number 3    Number of Visits 17    Date for SLP Re-Evaluation 06/11/20    Authorization Type Medicare    Authorization Time Period Start 04/14/2020    Authorization - Visit Number 3    Authorization - Number of Visits 10    Progress Note Due on Visit 10    SLP Start Time 1600    SLP Stop Time  1650    SLP Time Calculation (min) 50 min    Activity Tolerance Patient tolerated treatment well           Past Medical History:  Diagnosis Date   Acute on chronic respiratory failure with hypoxia ([place]) 07/30/2019   Acute respiratory failure with hypoxia ([place]) 12/31/2018   Carotid arterial disease ([place])    a. 02/2018 < bilat ICA stenosis.   COPD (chronic obstructive pulmonary disease) ([facility])    Coronary artery disease    a. 11/2015 MV: EF 57%, no ischemia/infarct.   Cryptogenic stroke ([place])    a. 10/2017 s/p implantable loop recorder (no Afib to date).   Diabetes mellitus without complication ([place])    Diarrhea    ESRD on dialysis [facility])    GI bleed    a. 01/2019 EGD/Colonoscopy: Mild gastritis. Colitis.   Heart murmur    a. 12/2018 Echo: EF 55-60%, impaired relaxation. Mod dil LA. Mildly dil RA. Mild Ca2+ of AoV.   Hematemesis 06/27/2017   History of 2019 novel coronavirus disease (COVID-19) 07/30/2019   Hyperkalemia    Hyperlipidemia    Hypertension    Intractable nausea and vomiting 08/08/2015   Nausea & vomiting 05/30/2018   Nausea and vomiting 05/29/2018   Pneumonia due to COVID-19 virus     Past Surgical History:  Procedure Laterality Date   A/V FISTULAGRAM Left 12/20/2016    Procedure: A/V Fistulagram;  Surgeon: Katha Cabal, MD;  Location: [place] CV LAB;  Service: Cardiovascular;  Laterality: Left;   A/V SHUNT INTERVENTION N/A 12/20/2016   Procedure: A/V Shunt Intervention;  Surgeon: Katha Cabal, MD;  Location: [place] CV LAB;  Service: Cardiovascular;  Laterality: N/A;   A/V SHUNTOGRAM Left 09/11/2017   Procedure: A/V SHUNTOGRAM;  Surgeon: Algernon Huxley, MD;  Location: [place] CV LAB;  Service: Cardiovascular;  Laterality: Left;   A/V SHUNTOGRAM Left 11/21/2019   Procedure: A/V SHUNTOGRAM;  Surgeon: Algernon Huxley, MD;  Location: [place] CV LAB;  Service: Cardiovascular;  Laterality: Left;   BREAST BIOPSY Bilateral 07/19/00   neg   COLONOSCOPY WITH PROPOFOL N/A 02/16/2019   Procedure: COLONOSCOPY WITH PROPOFOL;  Surgeon: Toledo, Benay Pike, MD;  Location: [facility] ENDOSCOPY;  Service: Gastroenterology;  Laterality: N/A;   ESOPHAGOGASTRODUODENOSCOPY (EGD) WITH PROPOFOL N/A 02/16/2019   Procedure: ESOPHAGOGASTRODUODENOSCOPY (EGD) WITH PROPOFOL;  Surgeon: Toledo, Benay Pike, MD;  Location: [facility];  Service: Gastroenterology;  Laterality: N/A;   LOOP RECORDER INSERTION N/A 11/16/2017   Procedure: LOOP RECORDER INSERTION;  Surgeon: Deboraha Sprang, MD;  Location: [place] CV LAB;  Service: Cardiovascular;  Laterality: N/A;   PERIPHERAL VASCULAR CATHETERIZATION Left 02/02/2015   Procedure: A/V Shuntogram/Fistulagram;  Surgeon: Corene Cornea  Bunnie Domino, MD;  Location: [place] CV LAB;  Service: Cardiovascular;  Laterality: Left;   PERIPHERAL VASCULAR CATHETERIZATION Left 02/02/2015   Procedure: A/V Shunt Intervention;  Surgeon: Algernon Huxley, MD;  Location: [place] CV LAB;  Service: Cardiovascular;  Laterality: Left;   PERIPHERAL VASCULAR CATHETERIZATION Left 03/09/2015   Procedure: A/V Shuntogram/Fistulagram;  Surgeon: Algernon Huxley, MD;  Location: [place] [facility] LAB;  Service: Cardiovascular;  Laterality: Left;   PERIPHERAL  VASCULAR CATHETERIZATION N/A 03/09/2015   Procedure: A/V Shunt Intervention;  Surgeon: Algernon Huxley, MD;  Location: [place] [facility] LAB;  Service: Cardiovascular;  Laterality: N/A;   PERIPHERAL VASCULAR CATHETERIZATION Left 04/17/2015   Procedure: Upper Extremity Angiography;  Surgeon: Algernon Huxley, MD;  Location: [place] CV LAB;  Service: Cardiovascular;  Laterality: Left;   PERIPHERAL VASCULAR CATHETERIZATION  04/17/2015   Procedure: Upper Extremity Intervention;  Surgeon: Algernon Huxley, MD;  Location: [place] CV LAB;  Service: Cardiovascular;;   PERIPHERAL VASCULAR CATHETERIZATION N/A 08/10/2015   Procedure: A/V Shuntogram/Fistulagram;  Surgeon: Algernon Huxley, MD;  Location: [place] CV LAB;  Service: Cardiovascular;  Laterality: N/A;   PERIPHERAL VASCULAR CATHETERIZATION N/A 08/10/2015   Procedure: A/V Shunt Intervention;  Surgeon: Algernon Huxley, MD;  Location: [place] [facility] LAB;  Service: Cardiovascular;  Laterality: N/A;   PERIPHERAL VASCULAR CATHETERIZATION Left 04/11/2016   Procedure: A/V Shuntogram/Fistulagram;  Surgeon: Algernon Huxley, MD;  Location: [place] [facility] LAB;  Service: Cardiovascular;  Laterality: Left;   TEE WITHOUT CARDIOVERSION N/A 11/16/2017   Procedure: TRANSESOPHAGEAL ECHOCARDIOGRAM (TEE);  Surgeon: Minna Merritts, MD;  Location: [facility];  Service: Cardiovascular;  Laterality: N/A;    There were no vitals filed for this visit.   Subjective Assessment - 04/23/20 1750    Subjective Distractible, asked confirming questions when unsure                 ADULT SLP TREATMENT - 04/23/20 0001      General Information   Behavior/Cognition Distractible      Treatment Provided   Treatment provided Cognitive-Linquistic      Cognitive-Linquistic Treatment   Treatment focused on Aphasia    Skilled Treatment COMPREHENSION: When given four words to choose from, patient filled in a sentence blank with 80% acc independently, increasing acc given min  clinician cues. Patient answered y/n questions with 2 instances of self corrections given 90% acc with min cues from clinician. Patient identified the target word given a field of 4 with 100% acc. Patient followed body commands with 100% acc. Patient followed one step commands given familiar objects independently with 60% acc after reading and 25% acc with increasing accuracy given mod-max cues in the form of gestures and identifying objects first. WRITING: With a visual written model, patient wrote a word to fill in a sentence independently with legible handwriting.       Assessment / Recommendations / Plan   Plan Continue with current plan of care      Progression Toward Goals   Progression toward goals Progressing toward goals            SLP Education - 04/23/20 1750    Education Details Redirection to task    Person(s) Educated Patient    Methods Explanation    Comprehension Verbalized understanding;Verbal cues required              SLP Long Term Goals - 04/15/20 1515      SLP LONG TERM GOAL #  1   Title Patient will generate grammatical and cogent sentence to complete simple/concrete linguistic task with 80% accuracy.    Time 8    Period Weeks    Status New    Target Date 06/11/20      SLP LONG TERM GOAL #2   Title Patient will complete multi-unit processing tasks with 80% accuracy without the need of repetition of task instructions or significant delays in responding.    Time 8    Period Weeks    Status New    Target Date 06/04/20      SLP LONG TERM GOAL #3   Title Patient complete functional reading tasks with 80% accuracy.    Time 8    Period Weeks    Status New    Target Date 06/11/20      SLP LONG TERM GOAL #4   Title Patient complete functional writing tasks with 80% accuracy.    Time 8    Period Weeks    Status New    Target Date 06/11/20      SLP LONG TERM GOAL #5   Title Patient will demonstrate functional cognitive-communication skills for  independent completion of personal responsibilities and leisure activities.    Time 8    Period Weeks    Status New    Target Date 06/11/20            Plan - 04/23/20 1751    Clinical Impression Statement Patient often requires redirection, and has difficulty understanding the task at hand (ex. Finding another writing utensil instead of the pencil in front of her). First, ensure that patient understands commands and has identified objects. Patients responses are more accurate when she reads commands vs. only auditory. Continue to target writing longer phrases and target functional reading comprehension. Introduce barrier task for spontaneous speech production.    Speech Therapy Frequency 2x / week    Duration 8 weeks    Treatment/Interventions SLP instruction and feedback;Language facilitation;Patient/family education    Potential to Achieve Goals Good    Potential Considerations Ability to learn/carryover information;Previous level of function;Co-morbidities;Severity of impairments;Cooperation/participation level;Medical prognosis;Family/community support    Consulted and Agree with Plan of Care Patient;Family member/caregiver           Patient will benefit from skilled therapeutic intervention in order to improve the following deficits and impairments:   Aphasia    Problem List Patient Active Problem List   Diagnosis Date Noted   Delay kidney tx func d/t fluid overload requiring acute dialysis ([place]) 01/29/2020   Delay kidney tx func d/t ATN and fluid overload require acute dialysis ([place]) 01/29/2020   Hypoglycemia 10/21/2019   Unresponsiveness 10/21/2019   GERD (gastroesophageal reflux disease) 10/21/2019   Hypothermia    Sepsis with encephalopathy without septic shock ([facility])    Acute metabolic encephalopathy    Acute on chronic diastolic CHF (congestive heart failure) ([place])    Acute on chronic respiratory failure with hypoxia ([place]) 07/30/2019   Rectal bleed  02/13/2019   Acute respiratory failure with hypoxia ([place]) 12/31/2018   Aphasia as late effect of cerebrovascular accident (CVA) 05/01/2018   Seizure ([place]) 03/14/2018   Stroke ([place]) 03/07/2018   Slurred speech 11/14/2017   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[place]) 11/12/2017   CAD (coronary artery disease) 11/12/2017   COPD (chronic obstructive pulmonary disease) ([place]) 11/12/2017   ESRD (end stage renal disease) ([place]) 11/12/2017   CVA (cerebral vascular accident) ([place]) 04/14/2016   Essential hypertension  11/27/2015   Hyperlipidemia 11/27/2015   Prolonged Q-T interval on ECG 54/65/0354   Aphasia complicating stroke 65/68/1275   Cervical spine arthritis 07/27/2015   Diabetic retinopathy without macular edema associated with type 2 diabetes mellitus ([place]) 07/27/2015   Osteoarthritis of spine with radiculopathy, lumbar region 07/27/2015   Obesity, Class III, BMI 40-49.9 (morbid obesity) ([place]) 07/27/2015   Vitamin D deficiency 17/00/1749   Diastolic heart failure ([place]) 10/27/2010   Lawernce Pitts, BS Graduate Clinician Lawernce Pitts 04/23/2020, 5:51 PM  [place] MAIN [facility] SERVICES [address] [place], [place], [address] Phone: [phone]   Fax:  [phone]   Name: Cynthia Dean MRN: [id number] Date of Birth: [birth year]

## 2020-04-27 ENCOUNTER — Ambulatory Visit (INDEPENDENT_AMBULATORY_CARE_PROVIDER_SITE_OTHER): Payer: Medicare HMO

## 2020-04-27 DIAGNOSIS — I6310 Cerebral infarction due to embolism of unspecified precerebral artery: Secondary | ICD-10-CM

## 2020-04-27 LAB — CUP PACEART REMOTE DEVICE CHECK
Date Time Interrogation Session: 20210929233650
Implantable Pulse Generator Implant Date: 20190425

## 2020-04-28 ENCOUNTER — Encounter: Payer: Self-pay | Admitting: Speech Pathology

## 2020-04-28 ENCOUNTER — Ambulatory Visit: Payer: Medicare HMO | Attending: Neurology | Admitting: Speech Pathology

## 2020-04-28 ENCOUNTER — Other Ambulatory Visit: Payer: Self-pay

## 2020-04-28 DIAGNOSIS — R4701 Aphasia: Secondary | ICD-10-CM | POA: Diagnosis present

## 2020-04-28 DIAGNOSIS — R41841 Cognitive communication deficit: Secondary | ICD-10-CM | POA: Diagnosis present

## 2020-04-28 NOTE — Progress Notes (Signed)
[facility]link Summary Report / Loop Recorder 

## 2020-04-29 NOTE — Therapy (Signed)
[place] MAIN [facility] SERVICES [address] [place], [place], [address] Phone: [phone]   Fax:  [phone]  Speech Language Pathology Treatment  Patient Details  Name: Cynthia Dean MRN: [id number] Date of Birth: 17-Aug-1950 Referring Provider (SLP): Dr. Melrose Nakayama   Encounter Date: 04/28/2020   End of Session - 04/29/20 1403    Visit Number 4    Number of Visits 17    Date for SLP Re-Evaluation 06/11/20    Authorization Type Medicare    Authorization Time Period Start 04/14/2020    Authorization - Visit Number 3    Progress Note Due on Visit 10    SLP Start Time 1600    SLP Stop Time  1640    SLP Time Calculation (min) 40 min    Activity Tolerance Patient tolerated treatment well           Past Medical History:  Diagnosis Date  . Acute on chronic respiratory failure with hypoxia ([place]) 07/30/2019  . Acute respiratory failure with hypoxia ([place]) 12/31/2018  . Carotid arterial disease ([place])    a. 02/2018 < bilat ICA stenosis.  [name] COPD (chronic obstructive pulmonary disease) ([place])   . Coronary artery disease    a. 11/2015 MV: EF 57%, no ischemia/infarct.  . Cryptogenic stroke ([place])    a. 10/2017 s/p implantable loop recorder (no Afib to date).  . Diabetes mellitus without complication ([place])   . Diarrhea   . ESRD on dialysis ([place])   . GI bleed    a. 01/2019 EGD/Colonoscopy: Mild gastritis. Colitis.  [name] Heart murmur    a. 12/2018 Echo: EF 55-60%, impaired relaxation. Mod dil LA. Mildly dil RA. Mild Ca2+ of AoV.  [name] Hematemesis 06/27/2017  . History of 2019 novel coronavirus disease (COVID-19) 07/30/2019  . Hyperkalemia   . Hyperlipidemia   . Hypertension   . Intractable nausea and vomiting 08/08/2015  . Nausea & vomiting 05/30/2018  . Nausea and vomiting 05/29/2018  . Pneumonia due to COVID-19 virus     Past Surgical History:  Procedure Laterality Date  . A/V FISTULAGRAM Left 12/20/2016   Procedure: A/V Fistulagram;  Surgeon:  Katha Cabal, MD;  Location: [place] [facility];  Service: Cardiovascular;  Laterality: Left;  . A/V SHUNT INTERVENTION N/A 12/20/2016   Procedure: A/V Shunt Intervention;  Surgeon: Katha Cabal, MD;  Location: [place] [facility];  Service: Cardiovascular;  Laterality: N/A;  . A/V SHUNTOGRAM Left 09/11/2017   Procedure: A/V SHUNTOGRAM;  Surgeon: Algernon Huxley, MD;  Location: [place] [facility];  Service: Cardiovascular;  Laterality: Left;  . A/V SHUNTOGRAM Left 11/21/2019   Procedure: A/V SHUNTOGRAM;  Surgeon: Algernon Huxley, MD;  Location: [place] [facility];  Service: Cardiovascular;  Laterality: Left;  . BREAST BIOPSY Bilateral 07/19/00   neg  . COLONOSCOPY WITH PROPOFOL N/A 02/16/2019   Procedure: COLONOSCOPY WITH PROPOFOL;  Surgeon: Toledo, Benay Pike, MD;  Location: [facility];  Service: Gastroenterology;  Laterality: N/A;  . ESOPHAGOGASTRODUODENOSCOPY (EGD) WITH PROPOFOL N/A 02/16/2019   Procedure: ESOPHAGOGASTRODUODENOSCOPY (EGD) WITH PROPOFOL;  Surgeon: Toledo, Benay Pike, MD;  Location: [facility];  Service: Gastroenterology;  Laterality: N/A;  . LOOP RECORDER INSERTION N/A 11/16/2017   Procedure: LOOP RECORDER INSERTION;  Surgeon: Deboraha Sprang, MD;  Location: [place] [facility];  Service: Cardiovascular;  Laterality: N/A;  . PERIPHERAL VASCULAR CATHETERIZATION Left 02/02/2015   Procedure: A/V Shuntogram/Fistulagram;  Surgeon: Algernon Huxley, MD;  Location: [place] [facility];  Service: Cardiovascular;  Laterality: Left;  . PERIPHERAL VASCULAR CATHETERIZATION Left 02/02/2015   Procedure: A/V Shunt Intervention;  Surgeon: Algernon Huxley, MD;  Location: [place] [facility];  Service: Cardiovascular;  Laterality: Left;  . PERIPHERAL VASCULAR CATHETERIZATION Left 03/09/2015   Procedure: A/V Shuntogram/Fistulagram;  Surgeon: Algernon Huxley, MD;  Location: [place] [facility];  Service: Cardiovascular;  Laterality: Left;  . PERIPHERAL VASCULAR CATHETERIZATION N/A 03/09/2015    Procedure: A/V Shunt Intervention;  Surgeon: Algernon Huxley, MD;  Location: [place] [facility];  Service: Cardiovascular;  Laterality: N/A;  . PERIPHERAL VASCULAR CATHETERIZATION Left 04/17/2015   Procedure: Upper Extremity Angiography;  Surgeon: Algernon Huxley, MD;  Location: [place] [facility];  Service: Cardiovascular;  Laterality: Left;  . PERIPHERAL VASCULAR CATHETERIZATION  04/17/2015   Procedure: Upper Extremity Intervention;  Surgeon: Algernon Huxley, MD;  Location: [place] [facility];  Service: Cardiovascular;;  . PERIPHERAL VASCULAR CATHETERIZATION N/A 08/10/2015   Procedure: A/V Shuntogram/Fistulagram;  Surgeon: Algernon Huxley, MD;  Location: [place] [facility];  Service: Cardiovascular;  Laterality: N/A;  . PERIPHERAL VASCULAR CATHETERIZATION N/A 08/10/2015   Procedure: A/V Shunt Intervention;  Surgeon: Algernon Huxley, MD;  Location: [place] [facility];  Service: Cardiovascular;  Laterality: N/A;  . PERIPHERAL VASCULAR CATHETERIZATION Left 04/11/2016   Procedure: A/V Shuntogram/Fistulagram;  Surgeon: Algernon Huxley, MD;  Location: [place] [facility];  Service: Cardiovascular;  Laterality: Left;  . TEE WITHOUT CARDIOVERSION N/A 11/16/2017   Procedure: TRANSESOPHAGEAL ECHOCARDIOGRAM (TEE);  Surgeon: Minna Merritts, MD;  Location: [facility];  Service: Cardiovascular;  Laterality: N/A;    There were no vitals filed for this visit.   Subjective Assessment - 04/29/20 1403    Subjective Appeared less distractible today    Currently in Pain? No/denies                 ADULT SLP TREATMENT - 04/29/20 1402      General Information   Behavior/Cognition Alert;Cooperative;Pleasant mood      Cognitive-Linquistic Treatment   Treatment focused on Aphasia    Skilled Treatment Pt was seen for skilled ST intervention targeting goals for cognitive and communicative function. SLP facilitated the session by providing functional reading tasks. Reading Comprehension: Pt was able to finish a sentence  given choice of 3 words with 100% accuracy. Pt followed written directions with 100% accuracy. Written Expression: Pt was able to complete a sentence with 2-4 words with 1 spelling error noted. When given a picture of an activity, pt wrote 1 sentence to describe what was happening. Mod+ visual and verbal cues were given to encourage additional information and attention to detail.       Assessment / Recommendations / Plan   Plan Continue with current plan of care      Progression Toward Goals   Progression toward goals Progressing toward goals            SLP Education - 04/29/20 1403    Education Details cointinued educaiton re: reading and writing tasks to complete at home    Person(s) Educated Patient    Methods Explanation    Comprehension Verbalized understanding;Verbal cues required              SLP Long Term Goals - 04/15/20 1515      SLP LONG TERM GOAL #1   Title Patient will generate grammatical and cogent sentence to complete simple/concrete linguistic task with 80% accuracy.    Time 8  Period Weeks    Status New    Target Date 06/11/20      SLP LONG TERM GOAL #2   Title Patient will complete multi-unit processing tasks with 80% accuracy without the need of repetition of task instructions or significant delays in responding.    Time 8    Period Weeks    Status New    Target Date 06/04/20      SLP LONG TERM GOAL #3   Title Patient complete functional reading tasks with 80% accuracy.    Time 8    Period Weeks    Status New    Target Date 06/11/20      SLP LONG TERM GOAL #4   Title Patient complete functional writing tasks with 80% accuracy.    Time 8    Period Weeks    Status New    Target Date 06/11/20      SLP LONG TERM GOAL #5   Title Patient will demonstrate functional cognitive-communication skills for independent completion of personal responsibilities and leisure activities.    Time 8    Period Weeks    Status New    Target Date 06/11/20             Plan - 04/29/20 1404    Clinical Impression Statement Pt was pleasant and cooperative with unfamiliar therapist. She was receptive to cues to continue working on a task.    Speech Therapy Frequency 2x / week    Duration 8 weeks    Treatment/Interventions SLP instruction and feedback;Language facilitation;Patient/family education    Potential to Achieve Goals Good    Potential Considerations Ability to learn/carryover information;Previous level of function;Co-morbidities;Severity of impairments;Cooperation/participation level;Medical prognosis;Family/community support    Consulted and Agree with Plan of Care Patient           Patient will benefit from skilled therapeutic intervention in order to improve the following deficits and impairments:   Cognitive communication deficit    Problem List Patient Active Problem List   Diagnosis Date Noted  . 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 Delay kidney tx func d/t ATN and fluid overload require acute dialysis ([place]) 01/29/2020  . Hypoglycemia 10/21/2019  . Unresponsiveness 10/21/2019  . GERD (gastroesophageal reflux disease) 10/21/2019  . Hypothermia   . Sepsis with encephalopathy without septic shock ([place])   . Acute metabolic encephalopathy   . Acute on chronic diastolic CHF (congestive heart failure) ([place])   . Acute on chronic respiratory failure with hypoxia ([place]) 07/30/2019  . Rectal bleed 02/13/2019  . Acute respiratory failure with hypoxia ([place]) 12/31/2018  . Aphasia as late effect of cerebrovascular accident (CVA) 05/01/2018  . Seizure ([place]) 03/14/2018  . Stroke ([place]) 03/07/2018  . Slurred speech 11/14/2017  . 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[place]) 11/12/2017  . CAD (coronary artery disease) 11/12/2017  . COPD (chronic obstructive pulmonary disease) ([place]) 11/12/2017  . ESRD (end stage renal disease) ([place]) 11/12/2017  . CVA (cerebral vascular  accident) ([place]) 04/14/2016  . Essential hypertension 11/27/2015  . Hyperlipidemia 11/27/2015  . Prolonged Q-T interval on ECG 11/26/2015  . Aphasia complicating stroke 68/34/1962  . Cervical spine arthritis 07/27/2015  . Diabetic retinopathy without macular edema associated with type 2 diabetes mellitus ([place]) 07/27/2015  . Osteoarthritis of spine with radiculopathy, lumbar region 07/27/2015  . Obesity, Class III, BMI 40-49.9 (morbid obesity) ([place]) 07/27/2015  . Vitamin D deficiency 01/21/2011  . Diastolic heart failure (  [place]) 10/27/2010   Dhriti Fales B. Quentin Ore [facility], CCC-SLP Speech Language Pathologist  Shonna Chock 04/29/2020, 2:06 PM  [place] MAIN [facility] SERVICES [address] [place], [place], [address] Phone: [phone]   Fax:  [phone]   Name: Yocheved Depner MRN: [id number] Date of Birth: 05-20-1951

## 2020-04-30 ENCOUNTER — Ambulatory Visit: Payer: Medicare HMO | Admitting: Speech Pathology

## 2020-05-05 ENCOUNTER — Other Ambulatory Visit: Payer: Self-pay | Admitting: Pediatrics

## 2020-05-05 ENCOUNTER — Encounter: Payer: Self-pay | Admitting: Speech Pathology

## 2020-05-05 ENCOUNTER — Other Ambulatory Visit: Payer: Self-pay

## 2020-05-05 ENCOUNTER — Ambulatory Visit: Payer: Medicare HMO | Admitting: Speech Pathology

## 2020-05-05 DIAGNOSIS — R4701 Aphasia: Secondary | ICD-10-CM

## 2020-05-05 DIAGNOSIS — R41841 Cognitive communication deficit: Secondary | ICD-10-CM | POA: Diagnosis not present

## 2020-05-05 DIAGNOSIS — Z1231 Encounter for screening mammogram for malignant neoplasm of breast: Secondary | ICD-10-CM

## 2020-05-05 NOTE — Therapy (Signed)
[place] MAIN [facility] SERVICES [address] [place], [place], [address] Phone: [phone]   Fax:  [phone]  Speech Language Pathology Treatment  Patient Details  Name: Cynthia Dean MRN: [id number] Date of Birth: Feb 24, 1951 Referring Provider (SLP): Dr. Melrose Nakayama   Encounter Date: 05/05/2020   End of Session - 05/05/20 1704    Visit Number 5    Number of Visits 17    Date for SLP Re-Evaluation 06/11/20    Authorization Type Medicare    Authorization Time Period Start 04/14/2020    Authorization - Visit Number 4    Authorization - Number of Visits 10    Progress Note Due on Visit 10    SLP Start Time 1600    SLP Stop Time  1650    SLP Time Calculation (min) 50 min    Activity Tolerance Patient tolerated treatment well           Past Medical History:  Diagnosis Date  . Acute on chronic respiratory failure with hypoxia ([place]) 07/30/2019  . Acute respiratory failure with hypoxia ([place]) 12/31/2018  . Carotid arterial disease ([place])    a. 02/2018 < bilat ICA stenosis.  [name] COPD (chronic obstructive pulmonary disease) ([place])   . Coronary artery disease    a. 11/2015 MV: EF 57%, no ischemia/infarct.  . Cryptogenic stroke ([place])    a. 10/2017 s/p implantable loop recorder (no Afib to date).  . Diabetes mellitus without complication ([place])   . Diarrhea   . ESRD on dialysis ([place])   . GI bleed    a. 01/2019 EGD/Colonoscopy: Mild gastritis. Colitis.  [name] Heart murmur    a. 12/2018 Echo: EF 55-60%, impaired relaxation. Mod dil LA. Mildly dil RA. Mild Ca2+ of AoV.  [name] Hematemesis 06/27/2017  . History of 2019 novel coronavirus disease (COVID-19) 07/30/2019  . Hyperkalemia   . Hyperlipidemia   . Hypertension   . Intractable nausea and vomiting 08/08/2015  . Nausea & vomiting 05/30/2018  . Nausea and vomiting 05/29/2018  . Pneumonia due to COVID-19 virus     Past Surgical History:  Procedure Laterality Date  . A/V FISTULAGRAM Left 12/20/2016    Procedure: A/V Fistulagram;  Surgeon: Katha Cabal, MD;  Location: [place] [facility];  Service: Cardiovascular;  Laterality: Left;  . A/V SHUNT INTERVENTION N/A 12/20/2016   Procedure: A/V Shunt Intervention;  Surgeon: Katha Cabal, MD;  Location: [place] [facility];  Service: Cardiovascular;  Laterality: N/A;  . A/V SHUNTOGRAM Left 09/11/2017   Procedure: A/V SHUNTOGRAM;  Surgeon: Algernon Huxley, MD;  Location: [place] [facility];  Service: Cardiovascular;  Laterality: Left;  . A/V SHUNTOGRAM Left 11/21/2019   Procedure: A/V SHUNTOGRAM;  Surgeon: Algernon Huxley, MD;  Location: [place] [facility];  Service: Cardiovascular;  Laterality: Left;  . BREAST BIOPSY Bilateral 07/19/00   neg  . COLONOSCOPY WITH PROPOFOL N/A 02/16/2019   Procedure: COLONOSCOPY WITH PROPOFOL;  Surgeon: Toledo, Benay Pike, MD;  Location: [facility];  Service: Gastroenterology;  Laterality: N/A;  . ESOPHAGOGASTRODUODENOSCOPY (EGD) WITH PROPOFOL N/A 02/16/2019   Procedure: ESOPHAGOGASTRODUODENOSCOPY (EGD) WITH PROPOFOL;  Surgeon: Toledo, Benay Pike, MD;  Location: [facility];  Service: Gastroenterology;  Laterality: N/A;  . LOOP RECORDER INSERTION N/A 11/16/2017   Procedure: LOOP RECORDER INSERTION;  Surgeon: Deboraha Sprang, MD;  Location: [place] [facility];  Service: Cardiovascular;  Laterality: N/A;  . PERIPHERAL VASCULAR CATHETERIZATION Left 02/02/2015   Procedure: A/V Shuntogram/Fistulagram;  Surgeon: Corene Cornea  Bunnie Domino, MD;  Location: [place] [facility];  Service: Cardiovascular;  Laterality: Left;  . PERIPHERAL VASCULAR CATHETERIZATION Left 02/02/2015   Procedure: A/V Shunt Intervention;  Surgeon: Algernon Huxley, MD;  Location: [place] [facility];  Service: Cardiovascular;  Laterality: Left;  . PERIPHERAL VASCULAR CATHETERIZATION Left 03/09/2015   Procedure: A/V Shuntogram/Fistulagram;  Surgeon: Algernon Huxley, MD;  Location: [place] [facility];  Service: Cardiovascular;  Laterality: Left;  . PERIPHERAL  VASCULAR CATHETERIZATION N/A 03/09/2015   Procedure: A/V Shunt Intervention;  Surgeon: Algernon Huxley, MD;  Location: [place] [facility];  Service: Cardiovascular;  Laterality: N/A;  . PERIPHERAL VASCULAR CATHETERIZATION Left 04/17/2015   Procedure: Upper Extremity Angiography;  Surgeon: Algernon Huxley, MD;  Location: [place] [facility];  Service: Cardiovascular;  Laterality: Left;  . PERIPHERAL VASCULAR CATHETERIZATION  04/17/2015   Procedure: Upper Extremity Intervention;  Surgeon: Algernon Huxley, MD;  Location: [place] [facility];  Service: Cardiovascular;;  . PERIPHERAL VASCULAR CATHETERIZATION N/A 08/10/2015   Procedure: A/V Shuntogram/Fistulagram;  Surgeon: Algernon Huxley, MD;  Location: [place] [facility];  Service: Cardiovascular;  Laterality: N/A;  . PERIPHERAL VASCULAR CATHETERIZATION N/A 08/10/2015   Procedure: A/V Shunt Intervention;  Surgeon: Algernon Huxley, MD;  Location: [place] [facility];  Service: Cardiovascular;  Laterality: N/A;  . PERIPHERAL VASCULAR CATHETERIZATION Left 04/11/2016   Procedure: A/V Shuntogram/Fistulagram;  Surgeon: Algernon Huxley, MD;  Location: [place] [facility];  Service: Cardiovascular;  Laterality: Left;  . TEE WITHOUT CARDIOVERSION N/A 11/16/2017   Procedure: TRANSESOPHAGEAL ECHOCARDIOGRAM (TEE);  Surgeon: Minna Merritts, MD;  Location: [facility];  Service: Cardiovascular;  Laterality: N/A;    There were no vitals filed for this visit.   Subjective Assessment - 05/05/20 1703    Subjective difficulty focusing, overall positive mood                 ADULT SLP TREATMENT - 05/05/20 0001      General Information   Behavior/Cognition Alert;Cooperative;Pleasant mood      Treatment Provided   Treatment provided Cognitive-Linquistic      Cognitive-Linquistic Treatment   Treatment focused on Aphasia    Skilled Treatment COMPREHENSION: Pt labeled 9/9 familiar objects accurately. Pt followed one step, 2 item commands with 50% acc independently, increasing  in acc when prompted to find objects first. Continues to have difficulty with paper/ tissue and often does not know where the object is until given 2 options to choose from. Answered complex y/n questions with 100% acc, some difficulty explaining "why". 40% acc answered questions about a sign, not supporting answers with evidence when incorrect. EXPRESSION: Pt listed parts of a picture and wrote a short, 3 word phrase about one detail. Pt exhibits strong self assessment skills when a word is written incorrectly. Caled burgers "cookies", understood error when grill in image was pointed out.       Assessment / Recommendations / Plan   Plan Continue with current plan of care      Progression Toward Goals   Progression toward goals Progressing toward goals            SLP Education - 05/05/20 1703    Education Details focus, reading entire questions    Person(s) Educated Patient    Methods Explanation    Comprehension Verbalized understanding;Returned demonstration              SLP Long Term Goals - 04/15/20 1515      SLP  LONG TERM GOAL #1   Title Patient will generate grammatical and cogent sentence to complete simple/concrete linguistic task with 80% accuracy.    Time 8    Period Weeks    Status New    Target Date 06/11/20      SLP LONG TERM GOAL #2   Title Patient will complete multi-unit processing tasks with 80% accuracy without the need of repetition of task instructions or significant delays in responding.    Time 8    Period Weeks    Status New    Target Date 06/04/20      SLP LONG TERM GOAL #3   Title Patient complete functional reading tasks with 80% accuracy.    Time 8    Period Weeks    Status New    Target Date 06/11/20      SLP LONG TERM GOAL #4   Title Patient complete functional writing tasks with 80% accuracy.    Time 8    Period Weeks    Status New    Target Date 06/11/20      SLP LONG TERM GOAL #5   Title Patient will demonstrate functional  cognitive-communication skills for independent completion of personal responsibilities and leisure activities.    Time 8    Period Weeks    Status New    Target Date 06/11/20            Plan - 05/05/20 1704    Clinical Impression Statement Continue to target word finding/ identification of objects in general. Target increased focus, strategies to comprehend written text and verbal questions. Target barrier task and functional writing.    Speech Therapy Frequency 2x / week    Duration 8 weeks    Treatment/Interventions SLP instruction and feedback;Language facilitation;Patient/family education    Potential to Achieve Goals Good    Potential Considerations Ability to learn/carryover information;Previous level of function;Co-morbidities;Severity of impairments;Cooperation/participation level;Medical prognosis;Family/community support    Consulted and Agree with Plan of Care Patient           Patient will benefit from skilled therapeutic intervention in order to improve the following deficits and impairments:   Aphasia    Problem List Patient Active Problem List   Diagnosis Date Noted  . 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 Delay kidney tx func d/t ATN and fluid overload require acute dialysis ([place]) 01/29/2020  . Hypoglycemia 10/21/2019  . Unresponsiveness 10/21/2019  . GERD (gastroesophageal reflux disease) 10/21/2019  . Hypothermia   . Sepsis with encephalopathy without septic shock ([place])   . Acute metabolic encephalopathy   . Acute on chronic diastolic CHF (congestive heart failure) ([place])   . Acute on chronic respiratory failure with hypoxia ([place]) 07/30/2019  . Rectal bleed 02/13/2019  . Acute respiratory failure with hypoxia ([place]) 12/31/2018  . Aphasia as late effect of cerebrovascular accident (CVA) 05/01/2018  . Seizure ([place]) 03/14/2018  . Stroke ([place]) 03/07/2018  . Slurred speech 11/14/2017  . Type 2 diabetes mellitus  with ESRD (end-stage renal disease) ([place]) 11/12/2017  . CAD (coronary artery disease) 11/12/2017  . COPD (chronic obstructive pulmonary disease) ([place]) 11/12/2017  . ESRD (end stage renal disease) ([place]) 11/12/2017  . CVA (cerebral vascular accident) ([place]) 04/14/2016  . Essential hypertension 11/27/2015  . Hyperlipidemia 11/27/2015  . Prolonged Q-T interval on ECG 11/26/2015  . Aphasia complicating stroke 03/50/0938  . Cervical spine arthritis 07/27/2015  . Diabetic retinopathy without macular edema associated with type 2  diabetes mellitus ([place]) 07/27/2015  . Osteoarthritis of spine with radiculopathy, lumbar region 07/27/2015  . Obesity, Class III, BMI 40-49.9 (morbid obesity) ([place]) 07/27/2015  . Vitamin D deficiency 01/21/2011  . Diastolic heart failure ([place]) 10/27/2010   Lawernce Pitts, BS Graduate Clinician Lawernce Pitts 05/05/2020, 5:05 PM  [place] MAIN [facility] SERVICES [address] [place], [place], [address] Phone: [phone]   Fax:  [phone]   Name: Kyira Volkert MRN: [id number] Date of Birth: 12-05-1950

## 2020-05-07 ENCOUNTER — Encounter: Payer: Self-pay | Admitting: Speech Pathology

## 2020-05-07 ENCOUNTER — Other Ambulatory Visit: Payer: Self-pay

## 2020-05-07 ENCOUNTER — Ambulatory Visit: Payer: Medicare HMO | Admitting: Speech Pathology

## 2020-05-07 DIAGNOSIS — R41841 Cognitive communication deficit: Secondary | ICD-10-CM

## 2020-05-07 DIAGNOSIS — R4701 Aphasia: Secondary | ICD-10-CM

## 2020-05-07 NOTE — Therapy (Signed)
[place] MAIN [facility] SERVICES [address] [place], [place], [address] Phone: [phone]   Fax:  [phone]  Speech Language Pathology Treatment  Patient Details  Name: Cynthia Dean MRN: [id number] Date of Birth: 1951/04/22 Referring Provider (SLP): Dr. Melrose Nakayama   Encounter Date: 05/07/2020   End of Session - 05/07/20 1806    Visit Number 6    Number of Visits 17    Date for SLP Re-Evaluation 06/11/20    Authorization Type Medicare    Authorization Time Period Start 04/14/2020    Authorization - Visit Number 5    Progress Note Due on Visit 10    SLP Start Time 1600    SLP Stop Time  1650    SLP Time Calculation (min) 50 min    Activity Tolerance Patient tolerated treatment well           Past Medical History:  Diagnosis Date  . Acute on chronic respiratory failure with hypoxia ([place]) 07/30/2019  . Acute respiratory failure with hypoxia ([place]) 12/31/2018  . Carotid arterial disease ([place])    a. 02/2018 < bilat ICA stenosis.  [name] COPD (chronic obstructive pulmonary disease) ([place])   . Coronary artery disease    a. 11/2015 MV: EF 57%, no ischemia/infarct.  . Cryptogenic stroke ([place])    a. 10/2017 s/p implantable loop recorder (no Afib to date).  . Diabetes mellitus without complication ([place])   . Diarrhea   . ESRD on dialysis ([place])   . GI bleed    a. 01/2019 EGD/Colonoscopy: Mild gastritis. Colitis.  [name] Heart murmur    a. 12/2018 Echo: EF 55-60%, impaired relaxation. Mod dil LA. Mildly dil RA. Mild Ca2+ of AoV.  [name] Hematemesis 06/27/2017  . History of 2019 novel coronavirus disease (COVID-19) 07/30/2019  . Hyperkalemia   . Hyperlipidemia   . Hypertension   . Intractable nausea and vomiting 08/08/2015  . Nausea & vomiting 05/30/2018  . Nausea and vomiting 05/29/2018  . Pneumonia due to COVID-19 virus     Past Surgical History:  Procedure Laterality Date  . A/V FISTULAGRAM Left 12/20/2016   Procedure: A/V Fistulagram;  Surgeon:  Katha Cabal, MD;  Location: [place] [facility];  Service: Cardiovascular;  Laterality: Left;  . A/V SHUNT INTERVENTION N/A 12/20/2016   Procedure: A/V Shunt Intervention;  Surgeon: Katha Cabal, MD;  Location: [place] [facility];  Service: Cardiovascular;  Laterality: N/A;  . A/V SHUNTOGRAM Left 09/11/2017   Procedure: A/V SHUNTOGRAM;  Surgeon: Algernon Huxley, MD;  Location: [place] [facility];  Service: Cardiovascular;  Laterality: Left;  . A/V SHUNTOGRAM Left 11/21/2019   Procedure: A/V SHUNTOGRAM;  Surgeon: Algernon Huxley, MD;  Location: [place] [facility];  Service: Cardiovascular;  Laterality: Left;  . BREAST BIOPSY Bilateral 07/19/00   neg  . COLONOSCOPY WITH PROPOFOL N/A 02/16/2019   Procedure: COLONOSCOPY WITH PROPOFOL;  Surgeon: Toledo, Benay Pike, MD;  Location: [facility];  Service: Gastroenterology;  Laterality: N/A;  . ESOPHAGOGASTRODUODENOSCOPY (EGD) WITH PROPOFOL N/A 02/16/2019   Procedure: ESOPHAGOGASTRODUODENOSCOPY (EGD) WITH PROPOFOL;  Surgeon: Toledo, Benay Pike, MD;  Location: [facility];  Service: Gastroenterology;  Laterality: N/A;  . LOOP RECORDER INSERTION N/A 11/16/2017   Procedure: LOOP RECORDER INSERTION;  Surgeon: Deboraha Sprang, MD;  Location: [place] [facility];  Service: Cardiovascular;  Laterality: N/A;  . PERIPHERAL VASCULAR CATHETERIZATION Left 02/02/2015   Procedure: A/V Shuntogram/Fistulagram;  Surgeon: Algernon Huxley, MD;  Location: [place] [facility];  Service: Cardiovascular;  Laterality: Left;  . PERIPHERAL VASCULAR CATHETERIZATION Left 02/02/2015   Procedure: A/V Shunt Intervention;  Surgeon: Algernon Huxley, MD;  Location: [place] [facility];  Service: Cardiovascular;  Laterality: Left;  . PERIPHERAL VASCULAR CATHETERIZATION Left 03/09/2015   Procedure: A/V Shuntogram/Fistulagram;  Surgeon: Algernon Huxley, MD;  Location: [place] [facility];  Service: Cardiovascular;  Laterality: Left;  . PERIPHERAL VASCULAR CATHETERIZATION N/A 03/09/2015    Procedure: A/V Shunt Intervention;  Surgeon: Algernon Huxley, MD;  Location: [place] [facility];  Service: Cardiovascular;  Laterality: N/A;  . PERIPHERAL VASCULAR CATHETERIZATION Left 04/17/2015   Procedure: Upper Extremity Angiography;  Surgeon: Algernon Huxley, MD;  Location: [place] [facility];  Service: Cardiovascular;  Laterality: Left;  . PERIPHERAL VASCULAR CATHETERIZATION  04/17/2015   Procedure: Upper Extremity Intervention;  Surgeon: Algernon Huxley, MD;  Location: [place] [facility];  Service: Cardiovascular;;  . PERIPHERAL VASCULAR CATHETERIZATION N/A 08/10/2015   Procedure: A/V Shuntogram/Fistulagram;  Surgeon: Algernon Huxley, MD;  Location: [place] [facility];  Service: Cardiovascular;  Laterality: N/A;  . PERIPHERAL VASCULAR CATHETERIZATION N/A 08/10/2015   Procedure: A/V Shunt Intervention;  Surgeon: Algernon Huxley, MD;  Location: [place] [facility];  Service: Cardiovascular;  Laterality: N/A;  . PERIPHERAL VASCULAR CATHETERIZATION Left 04/11/2016   Procedure: A/V Shuntogram/Fistulagram;  Surgeon: Algernon Huxley, MD;  Location: [place] [facility];  Service: Cardiovascular;  Laterality: Left;  . TEE WITHOUT CARDIOVERSION N/A 11/16/2017   Procedure: TRANSESOPHAGEAL ECHOCARDIOGRAM (TEE);  Surgeon: Minna Merritts, MD;  Location: [facility];  Service: Cardiovascular;  Laterality: N/A;    There were no vitals filed for this visit.   Subjective Assessment - 05/07/20 1806    Subjective difficulty focusing, overall positive mood                 ADULT SLP TREATMENT - 05/07/20 0001      General Information   Behavior/Cognition Alert;Cooperative;Pleasant mood      Treatment Provided   Treatment provided Cognitive-Linquistic      Pain Assessment   Pain Assessment No/denies pain      Cognitive-Linquistic Treatment   Treatment focused on Aphasia    Skilled Treatment WORD FINDING: State item given definition with 60% accuracy.  Name pictured objects with 65% accuracy.  Errors include  semantic/phonemic paraphasias, visual misperception, and frank word finding errors. READING COMPREHENSION: circle items that belong in a specified category given mod-max cues to retain instructions and determine category of each item.      Assessment / Recommendations / Plan   Plan Continue with current plan of care      Progression Toward Goals   Progression toward goals Progressing toward goals            SLP Education - 05/07/20 1806    Education Details word finding strategies    Person(s) Educated Patient    Methods Explanation    Comprehension Verbalized understanding              SLP Long Term Goals - 04/15/20 1515      SLP LONG TERM GOAL #1   Title Patient will generate grammatical and cogent sentence to complete simple/concrete linguistic task with 80% accuracy.    Time 8    Period Weeks    Status New    Target Date 06/11/20      SLP LONG TERM GOAL #2   Title Patient will complete multi-unit processing tasks with 80% accuracy without  the need of repetition of task instructions or significant delays in responding.    Time 8    Period Weeks    Status New    Target Date 06/04/20      SLP LONG TERM GOAL #3   Title Patient complete functional reading tasks with 80% accuracy.    Time 8    Period Weeks    Status New    Target Date 06/11/20      SLP LONG TERM GOAL #4   Title Patient complete functional writing tasks with 80% accuracy.    Time 8    Period Weeks    Status New    Target Date 06/11/20      SLP LONG TERM GOAL #5   Title Patient will demonstrate functional cognitive-communication skills for independent completion of personal responsibilities and leisure activities.    Time 8    Period Weeks    Status New    Target Date 06/11/20            Plan - 05/07/20 1807    Clinical Impression Statement Continue to target word finding/ identification of objects in general. Target increased focus, strategies to comprehend written text and verbal  questions. Target barrier task and functional writing.    Speech Therapy Frequency 2x / week    Duration 8 weeks    Treatment/Interventions SLP instruction and feedback;Language facilitation;Patient/family education    Potential to Achieve Goals Good    Potential Considerations Ability to learn/carryover information;Previous level of function;Co-morbidities;Severity of impairments;Cooperation/participation level;Medical prognosis;Family/community support    Consulted and Agree with Plan of Care Patient           Patient will benefit from skilled therapeutic intervention in order to improve the following deficits and impairments:   Aphasia  Cognitive communication deficit    Problem List Patient Active Problem List   Diagnosis Date Noted  . 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 Delay kidney tx func d/t ATN and fluid overload require acute dialysis ([place]) 01/29/2020  . Hypoglycemia 10/21/2019  . Unresponsiveness 10/21/2019  . GERD (gastroesophageal reflux disease) 10/21/2019  . Hypothermia   . Sepsis with encephalopathy without septic shock ([place])   . Acute metabolic encephalopathy   . Acute on chronic diastolic CHF (congestive heart failure) ([place])   . Acute on chronic respiratory failure with hypoxia ([place]) 07/30/2019  . Rectal bleed 02/13/2019  . Acute respiratory failure with hypoxia ([place]) 12/31/2018  . Aphasia as late effect of cerebrovascular accident (CVA) 05/01/2018  . Seizure ([place]) 03/14/2018  . Stroke ([place]) 03/07/2018  . Slurred speech 11/14/2017  . 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[place]) 11/12/2017  . CAD (coronary artery disease) 11/12/2017  . COPD (chronic obstructive pulmonary disease) ([place]) 11/12/2017  . ESRD (end stage renal disease) ([employer]) 11/12/2017  . CVA (cerebral vascular accident) ([place]) 04/14/2016  . Essential hypertension 11/27/2015  . Hyperlipidemia 11/27/2015  . Prolonged Q-T  interval on ECG 11/26/2015  . Aphasia complicating stroke 69/62/9528  . Cervical spine arthritis 07/27/2015  . Diabetic retinopathy without macular edema associated with type 2 diabetes mellitus ([place]) 07/27/2015  . Osteoarthritis of spine with radiculopathy, lumbar region 07/27/2015  . Obesity, Class III, BMI 40-49.9 (morbid obesity) ([place]) 07/27/2015  . Vitamin D deficiency 01/21/2011  . Diastolic heart failure ([place]) 10/27/2010   Leroy Sea, MS/CCC- SLP  Lou Miner 05/07/2020, 6:08 PM  [place] MAIN [facility] SERVICES [id number]  [place], [place], [address] Phone: [phone]   Fax:  [phone]   Name: Cornella Emmer MRN: [id number] Date of Birth: May 14, 1951

## 2020-05-12 ENCOUNTER — Other Ambulatory Visit: Payer: Self-pay

## 2020-05-12 ENCOUNTER — Encounter: Payer: Self-pay | Admitting: Speech Pathology

## 2020-05-12 ENCOUNTER — Ambulatory Visit: Payer: Medicare HMO | Admitting: Speech Pathology

## 2020-05-12 DIAGNOSIS — R4701 Aphasia: Secondary | ICD-10-CM

## 2020-05-12 DIAGNOSIS — R41841 Cognitive communication deficit: Secondary | ICD-10-CM | POA: Diagnosis not present

## 2020-05-12 NOTE — Therapy (Signed)
[place] MAIN [facility] SERVICES [address] [place], [place], [address] Phone: [phone]   Fax:  [phone]  Speech Language Pathology Treatment  Patient Details  Name: Cynthia Dean MRN: [id number] Date of Birth: 05/28/1951 Referring Provider (SLP): Dr. Melrose Nakayama   Encounter Date: 05/12/2020   End of Session - 05/12/20 1710    Visit Number 7    Number of Visits 17    Date for SLP Re-Evaluation 06/11/20    Authorization Type Medicare    Authorization Time Period Start 04/14/2020    Authorization - Visit Number 6    Authorization - Number of Visits 10    Progress Note Due on Visit 10    SLP Start Time 1602    SLP Stop Time  1652    SLP Time Calculation (min) 50 min    Activity Tolerance Patient tolerated treatment well           Past Medical History:  Diagnosis Date  . Acute on chronic respiratory failure with hypoxia ([place]) 07/30/2019  . Acute respiratory failure with hypoxia ([place]) 12/31/2018  . Carotid arterial disease ([place])    a. 02/2018 < bilat ICA stenosis.  [name] COPD (chronic obstructive pulmonary disease) ([place])   . Coronary artery disease    a. 11/2015 MV: EF 57%, no ischemia/infarct.  . Cryptogenic stroke ([place])    a. 10/2017 s/p implantable loop recorder (no Afib to date).  . Diabetes mellitus without complication ([place])   . Diarrhea   . ESRD on dialysis ([place])   . GI bleed    a. 01/2019 EGD/Colonoscopy: Mild gastritis. Colitis.  [name] Heart murmur    a. 12/2018 Echo: EF 55-60%, impaired relaxation. Mod dil LA. Mildly dil RA. Mild Ca2+ of AoV.  [name] Hematemesis 06/27/2017  . History of 2019 novel coronavirus disease (COVID-19) 07/30/2019  . Hyperkalemia   . Hyperlipidemia   . Hypertension   . Intractable nausea and vomiting 08/08/2015  . Nausea & vomiting 05/30/2018  . Nausea and vomiting 05/29/2018  . Pneumonia due to COVID-19 virus     Past Surgical History:  Procedure Laterality Date  . A/V FISTULAGRAM Left 12/20/2016    Procedure: A/V Fistulagram;  Surgeon: Katha Cabal, MD;  Location: [place] [facility];  Service: Cardiovascular;  Laterality: Left;  . A/V SHUNT INTERVENTION N/A 12/20/2016   Procedure: A/V Shunt Intervention;  Surgeon: Katha Cabal, MD;  Location: [place] [facility];  Service: Cardiovascular;  Laterality: N/A;  . A/V SHUNTOGRAM Left 09/11/2017   Procedure: A/V SHUNTOGRAM;  Surgeon: Algernon Huxley, MD;  Location: [place] [facility];  Service: Cardiovascular;  Laterality: Left;  . A/V SHUNTOGRAM Left 11/21/2019   Procedure: A/V SHUNTOGRAM;  Surgeon: Algernon Huxley, MD;  Location: [place] [facility];  Service: Cardiovascular;  Laterality: Left;  . BREAST BIOPSY Bilateral 07/19/00   neg  . COLONOSCOPY WITH PROPOFOL N/A 02/16/2019   Procedure: COLONOSCOPY WITH PROPOFOL;  Surgeon: Toledo, Benay Pike, MD;  Location: [facility];  Service: Gastroenterology;  Laterality: N/A;  . ESOPHAGOGASTRODUODENOSCOPY (EGD) WITH PROPOFOL N/A 02/16/2019   Procedure: ESOPHAGOGASTRODUODENOSCOPY (EGD) WITH PROPOFOL;  Surgeon: Toledo, Benay Pike, MD;  Location: [facility];  Service: Gastroenterology;  Laterality: N/A;  . LOOP RECORDER INSERTION N/A 11/16/2017   Procedure: LOOP RECORDER INSERTION;  Surgeon: Deboraha Sprang, MD;  Location: [place] [facility] LAB;  Service: Cardiovascular;  Laterality: N/A;  . PERIPHERAL VASCULAR CATHETERIZATION Left 02/02/2015   Procedure: A/V Shuntogram/Fistulagram;  Surgeon: Corene Cornea  Bunnie Domino, MD;  Location: [place] [facility];  Service: Cardiovascular;  Laterality: Left;  . PERIPHERAL VASCULAR CATHETERIZATION Left 02/02/2015   Procedure: A/V Shunt Intervention;  Surgeon: Algernon Huxley, MD;  Location: [place] [facility];  Service: Cardiovascular;  Laterality: Left;  . PERIPHERAL VASCULAR CATHETERIZATION Left 03/09/2015   Procedure: A/V Shuntogram/Fistulagram;  Surgeon: Algernon Huxley, MD;  Location: [place] [facility];  Service: Cardiovascular;  Laterality: Left;  . PERIPHERAL  VASCULAR CATHETERIZATION N/A 03/09/2015   Procedure: A/V Shunt Intervention;  Surgeon: Algernon Huxley, MD;  Location: [place] [facility];  Service: Cardiovascular;  Laterality: N/A;  . PERIPHERAL VASCULAR CATHETERIZATION Left 04/17/2015   Procedure: Upper Extremity Angiography;  Surgeon: Algernon Huxley, MD;  Location: [place] [facility];  Service: Cardiovascular;  Laterality: Left;  . PERIPHERAL VASCULAR CATHETERIZATION  04/17/2015   Procedure: Upper Extremity Intervention;  Surgeon: Algernon Huxley, MD;  Location: [place] [facility];  Service: Cardiovascular;;  . PERIPHERAL VASCULAR CATHETERIZATION N/A 08/10/2015   Procedure: A/V Shuntogram/Fistulagram;  Surgeon: Algernon Huxley, MD;  Location: [place] [facility];  Service: Cardiovascular;  Laterality: N/A;  . PERIPHERAL VASCULAR CATHETERIZATION N/A 08/10/2015   Procedure: A/V Shunt Intervention;  Surgeon: Algernon Huxley, MD;  Location: [place] [facility];  Service: Cardiovascular;  Laterality: N/A;  . PERIPHERAL VASCULAR CATHETERIZATION Left 04/11/2016   Procedure: A/V Shuntogram/Fistulagram;  Surgeon: Algernon Huxley, MD;  Location: [place] [facility];  Service: Cardiovascular;  Laterality: Left;  . TEE WITHOUT CARDIOVERSION N/A 11/16/2017   Procedure: TRANSESOPHAGEAL ECHOCARDIOGRAM (TEE);  Surgeon: Minna Merritts, MD;  Location: [facility];  Service: Cardiovascular;  Laterality: N/A;    There were no vitals filed for this visit.   Subjective Assessment - 05/12/20 1709    Subjective somewhat improved focus, glasses    Currently in Pain? No/denies                 ADULT SLP TREATMENT - 05/12/20 0001      General Information   Behavior/Cognition Alert;Cooperative;Pleasant mood      Treatment Provided   Treatment provided Cognitive-Linquistic      Cognitive-Linquistic Treatment   Treatment focused on Aphasia    Skilled Treatment WORD FINDING: Named item given visual presentation (both familiar and unfamiliar) with 86% acc, and shared a  potential action for each. Given definition, stated item with 90% acc independently, 100% given min cues. Some semantic paraphasias and frank word finding errors.COMPREHENSION: Read and followed directions for concrete category questions with 65% acc independently, required refocus and cueing to understand instructions and remain on task. Patient required additional time for this portion. Answered yes/ no questions and elaborated on answer with 90% acc. WRITING: With some spelling errors, patient wrote objects after naming them verbally with minimal lapses in cogency and adequate self correction skills.       Assessment / Recommendations / Plan   Plan Continue with current plan of care      Progression Toward Goals   Progression toward goals Progressing toward goals            SLP Education - 05/12/20 1710    Education Details word finding strategies    Person(s) Educated Patient    Methods Explanation    Comprehension Verbalized understanding;Verbal cues required              SLP Long Term Goals - 04/15/20 1515      SLP LONG TERM GOAL #1  Title Patient will generate grammatical and cogent sentence to complete simple/concrete linguistic task with 80% accuracy.    Time 8    Period Weeks    Status New    Target Date 06/11/20      SLP LONG TERM GOAL #2   Title Patient will complete multi-unit processing tasks with 80% accuracy without the need of repetition of task instructions or significant delays in responding.    Time 8    Period Weeks    Status New    Target Date 06/04/20      SLP LONG TERM GOAL #3   Title Patient complete functional reading tasks with 80% accuracy.    Time 8    Period Weeks    Status New    Target Date 06/11/20      SLP LONG TERM GOAL #4   Title Patient complete functional writing tasks with 80% accuracy.    Time 8    Period Weeks    Status New    Target Date 06/11/20      SLP LONG TERM GOAL #5   Title Patient will demonstrate functional  cognitive-communication skills for independent completion of personal responsibilities and leisure activities.    Time 8    Period Weeks    Status New    Target Date 06/11/20            Plan - 05/12/20 1710    Clinical Impression Statement Continue to target word finding, specifically naming given definitions and confrontation naming given categories. Target increased focus, give explicit instructions and encourage writing throughout. Introduce strategies when pt experiences difficulty word finding. Target written/ auditory comprehension and barrier task to gauge verbal expression.    Speech Therapy Frequency 2x / week    Duration 8 weeks    Treatment/Interventions SLP instruction and feedback;Language facilitation;Patient/family education    Potential to Achieve Goals Good    Potential Considerations Ability to learn/carryover information;Previous level of function;Co-morbidities;Severity of impairments;Cooperation/participation level;Medical prognosis;Family/community support    Consulted and Agree with Plan of Care Patient           Patient will benefit from skilled therapeutic intervention in order to improve the following deficits and impairments:   Aphasia  Cognitive communication deficit    Problem List Patient Active Problem List   Diagnosis Date Noted  . 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 Delay kidney tx func d/t ATN and fluid overload require acute dialysis ([place]) 01/29/2020  . Hypoglycemia 10/21/2019  . Unresponsiveness 10/21/2019  . GERD (gastroesophageal reflux disease) 10/21/2019  . Hypothermia   . Sepsis with encephalopathy without septic shock ([place])   . Acute metabolic encephalopathy   . Acute on chronic diastolic CHF (congestive heart failure) ([place])   . Acute on chronic respiratory failure with hypoxia ([place]) 07/30/2019  . Rectal bleed 02/13/2019  . Acute respiratory failure with hypoxia ([place]) 12/31/2018  .  Aphasia as late effect of cerebrovascular accident (CVA) 05/01/2018  . Seizure ([place]) 03/14/2018  . Stroke ([place]) 03/07/2018  . Slurred speech 11/14/2017  . 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[place]) 11/12/2017  . CAD (coronary artery disease) 11/12/2017  . COPD (chronic obstructive pulmonary disease) ([place]) 11/12/2017  . ESRD (end stage renal disease) ([place]) 11/12/2017  . CVA (cerebral vascular accident) ([place]) 04/14/2016  . Essential hypertension 11/27/2015  . Hyperlipidemia 11/27/2015  . Prolonged Q-T interval on ECG 11/26/2015  . Aphasia complicating stroke 56/43/3295  . Cervical  spine arthritis 07/27/2015  . Diabetic retinopathy without macular edema associated with type 2 diabetes mellitus ([place]) 07/27/2015  . Osteoarthritis of spine with radiculopathy, lumbar region 07/27/2015  . Obesity, Class III, BMI 40-49.9 (morbid obesity) ([place]) 07/27/2015  . Vitamin D deficiency 01/21/2011  . Diastolic heart failure ([place]) 10/27/2010   Lawernce Pitts, BS Graduate Clinician Lawernce Pitts 05/12/2020, 5:11 PM  [place] MAIN [facility] SERVICES [address] [place], [place], [address] Phone: [phone]   Fax:  [phone]   Name: Cynthia Dean MRN: [id number] Date of Birth: [birth year]

## 2020-05-14 ENCOUNTER — Ambulatory Visit: Payer: Medicare HMO | Admitting: Speech Pathology

## 2020-05-14 ENCOUNTER — Other Ambulatory Visit: Payer: Self-pay

## 2020-05-14 ENCOUNTER — Encounter: Payer: Self-pay | Admitting: Speech Pathology

## 2020-05-14 DIAGNOSIS — R4701 Aphasia: Secondary | ICD-10-CM

## 2020-05-14 DIAGNOSIS — R41841 Cognitive communication deficit: Secondary | ICD-10-CM | POA: Diagnosis not present

## 2020-05-14 NOTE — Therapy (Signed)
[place] MAIN [facility] SERVICES [address] [place], [place], [address] Phone: [phone]   Fax:  [phone]  Speech Language Pathology Treatment  Patient Details  Name: Cynthia Dean MRN: [id number] Date of Birth: May 19, 1951 Referring Provider (SLP): Dr. Melrose Nakayama   Encounter Date: 05/14/2020   End of Session - 05/14/20 1705    Visit Number 8    Number of Visits 17    Date for SLP Re-Evaluation 06/11/20    Authorization Type Medicare    Authorization Time Period Start 04/14/2020    Authorization - Visit Number 7    Authorization - Number of Visits 10    Progress Note Due on Visit 10    SLP Start Time 1600    SLP Stop Time  1650    SLP Time Calculation (min) 50 min    Activity Tolerance Patient tolerated treatment well           Past Medical History:  Diagnosis Date  . Acute on chronic respiratory failure with hypoxia ([place]) 07/30/2019  . Acute respiratory failure with hypoxia ([place]) 12/31/2018  . Carotid arterial disease ([place])    a. 02/2018 < bilat ICA stenosis.  [name] COPD (chronic obstructive pulmonary disease) ([place])   . Coronary artery disease    a. 11/2015 MV: EF 57%, no ischemia/infarct.  . Cryptogenic stroke ([place])    a. 10/2017 s/p implantable loop recorder (no Afib to date).  . Diabetes mellitus without complication ([place])   . Diarrhea   . ESRD on dialysis ([place])   . GI bleed    a. 01/2019 EGD/Colonoscopy: Mild gastritis. Colitis.  [name] Heart murmur    a. 12/2018 Echo: EF 55-60%, impaired relaxation. Mod dil LA. Mildly dil RA. Mild Ca2+ of AoV.  [name] Hematemesis 06/27/2017  . History of 2019 novel coronavirus disease (COVID-19) 07/30/2019  . Hyperkalemia   . Hyperlipidemia   . Hypertension   . Intractable nausea and vomiting 08/08/2015  . Nausea & vomiting 05/30/2018  . Nausea and vomiting 05/29/2018  . Pneumonia due to COVID-19 virus     Past Surgical History:  Procedure Laterality Date  . A/V FISTULAGRAM Left 12/20/2016    Procedure: A/V Fistulagram;  Surgeon: Katha Cabal, MD;  Location: [place] [facility];  Service: Cardiovascular;  Laterality: Left;  . A/V SHUNT INTERVENTION N/A 12/20/2016   Procedure: A/V Shunt Intervention;  Surgeon: Katha Cabal, MD;  Location: [place] [facility];  Service: Cardiovascular;  Laterality: N/A;  . A/V SHUNTOGRAM Left 09/11/2017   Procedure: A/V SHUNTOGRAM;  Surgeon: Algernon Huxley, MD;  Location: [place] [facility];  Service: Cardiovascular;  Laterality: Left;  . A/V SHUNTOGRAM Left 11/21/2019   Procedure: A/V SHUNTOGRAM;  Surgeon: Algernon Huxley, MD;  Location: [place] [facility];  Service: Cardiovascular;  Laterality: Left;  . BREAST BIOPSY Bilateral 07/19/00   neg  . COLONOSCOPY WITH PROPOFOL N/A 02/16/2019   Procedure: COLONOSCOPY WITH PROPOFOL;  Surgeon: Toledo, Benay Pike, MD;  Location: [facility];  Service: Gastroenterology;  Laterality: N/A;  . ESOPHAGOGASTRODUODENOSCOPY (EGD) WITH PROPOFOL N/A 02/16/2019   Procedure: ESOPHAGOGASTRODUODENOSCOPY (EGD) WITH PROPOFOL;  Surgeon: Toledo, Benay Pike, MD;  Location: [facility];  Service: Gastroenterology;  Laterality: N/A;  . LOOP RECORDER INSERTION N/A 11/16/2017   Procedure: LOOP RECORDER INSERTION;  Surgeon: Deboraha Sprang, MD;  Location: [place] [facility];  Service: Cardiovascular;  Laterality: N/A;  . PERIPHERAL VASCULAR CATHETERIZATION Left 02/02/2015   Procedure: A/V Shuntogram/Fistulagram;  Surgeon: Corene Cornea  Bunnie Domino, MD;  Location: [place] [facility];  Service: Cardiovascular;  Laterality: Left;  . PERIPHERAL VASCULAR CATHETERIZATION Left 02/02/2015   Procedure: A/V Shunt Intervention;  Surgeon: Algernon Huxley, MD;  Location: [place] [facility];  Service: Cardiovascular;  Laterality: Left;  . PERIPHERAL VASCULAR CATHETERIZATION Left 03/09/2015   Procedure: A/V Shuntogram/Fistulagram;  Surgeon: Algernon Huxley, MD;  Location: [place] [facility];  Service: Cardiovascular;  Laterality: Left;  . PERIPHERAL  VASCULAR CATHETERIZATION N/A 03/09/2015   Procedure: A/V Shunt Intervention;  Surgeon: Algernon Huxley, MD;  Location: [place] [facility];  Service: Cardiovascular;  Laterality: N/A;  . PERIPHERAL VASCULAR CATHETERIZATION Left 04/17/2015   Procedure: Upper Extremity Angiography;  Surgeon: Algernon Huxley, MD;  Location: [place] [facility];  Service: Cardiovascular;  Laterality: Left;  . PERIPHERAL VASCULAR CATHETERIZATION  04/17/2015   Procedure: Upper Extremity Intervention;  Surgeon: Algernon Huxley, MD;  Location: [place] [facility];  Service: Cardiovascular;;  . PERIPHERAL VASCULAR CATHETERIZATION N/A 08/10/2015   Procedure: A/V Shuntogram/Fistulagram;  Surgeon: Algernon Huxley, MD;  Location: [place] [facility];  Service: Cardiovascular;  Laterality: N/A;  . PERIPHERAL VASCULAR CATHETERIZATION N/A 08/10/2015   Procedure: A/V Shunt Intervention;  Surgeon: Algernon Huxley, MD;  Location: [place] [facility];  Service: Cardiovascular;  Laterality: N/A;  . PERIPHERAL VASCULAR CATHETERIZATION Left 04/11/2016   Procedure: A/V Shuntogram/Fistulagram;  Surgeon: Algernon Huxley, MD;  Location: [place] [facility];  Service: Cardiovascular;  Laterality: Left;  . TEE WITHOUT CARDIOVERSION N/A 11/16/2017   Procedure: TRANSESOPHAGEAL ECHOCARDIOGRAM (TEE);  Surgeon: Minna Merritts, MD;  Location: [facility];  Service: Cardiovascular;  Laterality: N/A;    There were no vitals filed for this visit.   Subjective Assessment - 05/14/20 1704    Subjective "I'm tired", no glasses    Currently in Pain? No/denies                 ADULT SLP TREATMENT - 05/14/20 0001      General Information   Behavior/Cognition Pleasant mood;Distractible      Treatment Provided   Treatment provided Cognitive-Linquistic      Cognitive-Linquistic Treatment   Treatment focused on Aphasia    Skilled Treatment WORD FINDING: Matched item to written presentation. (FO4) given action or descriptor identified target with 90% acc, 100% given  two choices for one object. Selected vategory given 3 choices with 70% acc, 100% acc given mod max cues mainly to refocus and reread. Required additional time for this new task. WRITING: Some spelling errors within writing throughout session, overall intelligible with present self correction skills. Wrote one phrase/ short sentence for each picture in barrier task. COMPREHENSION: Read and followed directions for one-step two item commands written and verbal with 80% acc, verbal only 60% acc and 90% acc with mod emphasis/ gestural cues. BARRIER TASK: Given mod cues to elaborate, pt described 4/4 pictures familiar to clinician. Increased difficulty/ time required as complexity increased.       Assessment / Recommendations / Plan   Plan Continue with current plan of care      Progression Toward Goals   Progression toward goals Progressing toward goals            SLP Education - 05/14/20 1704    Education Details word finding activities, focus    Person(s) Educated Patient    Methods Explanation    Comprehension Verbalized understanding;Verbal cues required  SLP Long Term Goals - 04/15/20 1515      SLP LONG TERM GOAL #1   Title Patient will generate grammatical and cogent sentence to complete simple/concrete linguistic task with 80% accuracy.    Time 8    Period Weeks    Status New    Target Date 06/11/20      SLP LONG TERM GOAL #2   Title Patient will complete multi-unit processing tasks with 80% accuracy without the need of repetition of task instructions or significant delays in responding.    Time 8    Period Weeks    Status New    Target Date 06/04/20      SLP LONG TERM GOAL #3   Title Patient complete functional reading tasks with 80% accuracy.    Time 8    Period Weeks    Status New    Target Date 06/11/20      SLP LONG TERM GOAL #4   Title Patient complete functional writing tasks with 80% accuracy.    Time 8    Period Weeks    Status New    Target  Date 06/11/20      SLP LONG TERM GOAL #5   Title Patient will demonstrate functional cognitive-communication skills for independent completion of personal responsibilities and leisure activities.    Time 8    Period Weeks    Status New    Target Date 06/11/20            Plan - 05/14/20 1705    Clinical Impression Statement Target word finding, confrontation naming and given categories to match. Target reading, specifically reading own handwriting and writing to do so. Introduce strategies to use when difficulty word finding. Target auditory comprehension at the word level. Continue to utilize barrier task to gauge word finding and ability to focus/ follow directions.    Speech Therapy Frequency 2x / week    Duration 8 weeks    Treatment/Interventions SLP instruction and feedback;Language facilitation;Patient/family education    Potential to Achieve Goals Good    Potential Considerations Ability to learn/carryover information;Previous level of function;Co-morbidities;Severity of impairments;Cooperation/participation level;Medical prognosis;Family/community support    Consulted and Agree with Plan of Care Patient           Patient will benefit from skilled therapeutic intervention in order to improve the following deficits and impairments:   Aphasia  Cognitive communication deficit    Problem List Patient Active Problem List   Diagnosis Date Noted  . 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 Delay kidney tx func d/t ATN and fluid overload require acute dialysis ([place]) 01/29/2020  . Hypoglycemia 10/21/2019  . Unresponsiveness 10/21/2019  . GERD (gastroesophageal reflux disease) 10/21/2019  . Hypothermia   . Sepsis with encephalopathy without septic shock ([place])   . Acute metabolic encephalopathy   . Acute on chronic diastolic CHF (congestive heart failure) ([place])   . Acute on chronic respiratory failure with hypoxia ([place]) 07/30/2019   . Rectal bleed 02/13/2019  . Acute respiratory failure with hypoxia ([place]) 12/31/2018  . Aphasia as late effect of cerebrovascular accident (CVA) 05/01/2018  . Seizure ([place]) 03/14/2018  . Stroke ([place]) 03/07/2018  . Slurred speech 11/14/2017  . 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[place]) 11/12/2017  . CAD (coronary artery disease) 11/12/2017  . COPD (chronic obstructive pulmonary disease) ([place]) 11/12/2017  . ESRD (end stage renal disease) ([place]) 11/12/2017  . CVA (cerebral vascular accident) ([place])  04/14/2016  . Essential hypertension 11/27/2015  . Hyperlipidemia 11/27/2015  . Prolonged Q-T interval on ECG 11/26/2015  . Aphasia complicating stroke 68/95/7022  . Cervical spine arthritis 07/27/2015  . Diabetic retinopathy without macular edema associated with type 2 diabetes mellitus ([place]) 07/27/2015  . Osteoarthritis of spine with radiculopathy, lumbar region 07/27/2015  . Obesity, Class III, BMI 40-49.9 (morbid obesity) ([place]) 07/27/2015  . Vitamin D deficiency 01/21/2011  . Diastolic heart failure ([place]) 10/27/2010   Lawernce Pitts, BS Graduate Clinician Lawernce Pitts 05/14/2020, 5:08 PM  [place] MAIN [facility] SERVICES [address] [place], [place], [address] Phone: [phone]   Fax:  [phone]   Name: Lizzett Nobile MRN: [id number] Date of Birth: 01/04/1951

## 2020-05-19 ENCOUNTER — Ambulatory Visit: Payer: Medicare HMO | Admitting: Speech Pathology

## 2020-05-21 ENCOUNTER — Ambulatory Visit: Payer: Medicare HMO | Admitting: Speech Pathology

## 2020-05-26 ENCOUNTER — Encounter: Payer: Self-pay | Admitting: Speech Pathology

## 2020-05-26 ENCOUNTER — Ambulatory Visit: Payer: Medicare HMO | Attending: Neurology | Admitting: Speech Pathology

## 2020-05-26 ENCOUNTER — Other Ambulatory Visit: Payer: Self-pay

## 2020-05-26 DIAGNOSIS — R4701 Aphasia: Secondary | ICD-10-CM | POA: Diagnosis present

## 2020-05-26 DIAGNOSIS — R41841 Cognitive communication deficit: Secondary | ICD-10-CM | POA: Insufficient documentation

## 2020-05-26 NOTE — Therapy (Signed)
[place] MAIN [facility] SERVICES [address] [place], [place], [address] Phone: [phone]   Fax:  [phone]  Speech Language Pathology Treatment  Patient Details  Name: Cynthia Dean MRN: [id number] Date of Birth: 08-Aug-1950 Referring Provider (SLP): Dr. Melrose Nakayama   Encounter Date: 05/26/2020   End of Session - 05/26/20 1705    Visit Number 9    Number of Visits 17    Date for SLP Re-Evaluation 06/11/20    Authorization Type Medicare    Authorization Time Period Start 04/14/2020    Authorization - Visit Number 9    Authorization - Number of Visits 10    Progress Note Due on Visit 10    SLP Start Time 1600    SLP Stop Time  1650    SLP Time Calculation (min) 50 min    Activity Tolerance Patient tolerated treatment well           Past Medical History:  Diagnosis Date  . Acute on chronic respiratory failure with hypoxia ([place]) 07/30/2019  . Acute respiratory failure with hypoxia ([place]) 12/31/2018  . Carotid arterial disease ([place])    a. 02/2018 < bilat ICA stenosis.  [name] COPD (chronic obstructive pulmonary disease) ([place])   . Coronary artery disease    a. 11/2015 MV: EF 57%, no ischemia/infarct.  . Cryptogenic stroke ([place])    a. 10/2017 s/p implantable loop recorder (no Afib to date).  . Diabetes mellitus without complication ([place])   . Diarrhea   . ESRD on dialysis ([place])   . GI bleed    a. 01/2019 EGD/Colonoscopy: Mild gastritis. Colitis.  [name] Heart murmur    a. 12/2018 Echo: EF 55-60%, impaired relaxation. Mod dil LA. Mildly dil RA. Mild Ca2+ of AoV.  [name] Hematemesis 06/27/2017  . History of 2019 novel coronavirus disease (COVID-19) 07/30/2019  . Hyperkalemia   . Hyperlipidemia   . Hypertension   . Intractable nausea and vomiting 08/08/2015  . Nausea & vomiting 05/30/2018  . Nausea and vomiting 05/29/2018  . Pneumonia due to COVID-19 virus     Past Surgical History:  Procedure Laterality Date  . A/V FISTULAGRAM Left 12/20/2016    Procedure: A/V Fistulagram;  Surgeon: Katha Cabal, MD;  Location: [place] [facility];  Service: Cardiovascular;  Laterality: Left;  . A/V SHUNT INTERVENTION N/A 12/20/2016   Procedure: A/V Shunt Intervention;  Surgeon: Katha Cabal, MD;  Location: [place] [facility];  Service: Cardiovascular;  Laterality: N/A;  . A/V SHUNTOGRAM Left 09/11/2017   Procedure: A/V SHUNTOGRAM;  Surgeon: Algernon Huxley, MD;  Location: [place] [facility];  Service: Cardiovascular;  Laterality: Left;  . A/V SHUNTOGRAM Left 11/21/2019   Procedure: A/V SHUNTOGRAM;  Surgeon: Algernon Huxley, MD;  Location: [place] [facility];  Service: Cardiovascular;  Laterality: Left;  . BREAST BIOPSY Bilateral 07/19/00   neg  . COLONOSCOPY WITH PROPOFOL N/A 02/16/2019   Procedure: COLONOSCOPY WITH PROPOFOL;  Surgeon: Toledo, Benay Pike, MD;  Location: [facility];  Service: Gastroenterology;  Laterality: N/A;  . ESOPHAGOGASTRODUODENOSCOPY (EGD) WITH PROPOFOL N/A 02/16/2019   Procedure: ESOPHAGOGASTRODUODENOSCOPY (EGD) WITH PROPOFOL;  Surgeon: Toledo, Benay Pike, MD;  Location: [facility];  Service: Gastroenterology;  Laterality: N/A;  . LOOP RECORDER INSERTION N/A 11/16/2017   Procedure: LOOP RECORDER INSERTION;  Surgeon: Deboraha Sprang, MD;  Location: [place] [facility];  Service: Cardiovascular;  Laterality: N/A;  . PERIPHERAL VASCULAR CATHETERIZATION Left 02/02/2015   Procedure: A/V Shuntogram/Fistulagram;  Surgeon: Corene Cornea  Bunnie Domino, MD;  Location: [place] [facility];  Service: Cardiovascular;  Laterality: Left;  . PERIPHERAL VASCULAR CATHETERIZATION Left 02/02/2015   Procedure: A/V Shunt Intervention;  Surgeon: Algernon Huxley, MD;  Location: [place] [facility];  Service: Cardiovascular;  Laterality: Left;  . PERIPHERAL VASCULAR CATHETERIZATION Left 03/09/2015   Procedure: A/V Shuntogram/Fistulagram;  Surgeon: Algernon Huxley, MD;  Location: [place] [facility];  Service: Cardiovascular;  Laterality: Left;  . PERIPHERAL  VASCULAR CATHETERIZATION N/A 03/09/2015   Procedure: A/V Shunt Intervention;  Surgeon: Algernon Huxley, MD;  Location: [place] [facility];  Service: Cardiovascular;  Laterality: N/A;  . PERIPHERAL VASCULAR CATHETERIZATION Left 04/17/2015   Procedure: Upper Extremity Angiography;  Surgeon: Algernon Huxley, MD;  Location: [place] [facility];  Service: Cardiovascular;  Laterality: Left;  . PERIPHERAL VASCULAR CATHETERIZATION  04/17/2015   Procedure: Upper Extremity Intervention;  Surgeon: Algernon Huxley, MD;  Location: [place] [facility];  Service: Cardiovascular;;  . PERIPHERAL VASCULAR CATHETERIZATION N/A 08/10/2015   Procedure: A/V Shuntogram/Fistulagram;  Surgeon: Algernon Huxley, MD;  Location: [place] [facility];  Service: Cardiovascular;  Laterality: N/A;  . PERIPHERAL VASCULAR CATHETERIZATION N/A 08/10/2015   Procedure: A/V Shunt Intervention;  Surgeon: Algernon Huxley, MD;  Location: [place] [facility];  Service: Cardiovascular;  Laterality: N/A;  . PERIPHERAL VASCULAR CATHETERIZATION Left 04/11/2016   Procedure: A/V Shuntogram/Fistulagram;  Surgeon: Algernon Huxley, MD;  Location: [place] [facility];  Service: Cardiovascular;  Laterality: Left;  . TEE WITHOUT CARDIOVERSION N/A 11/16/2017   Procedure: TRANSESOPHAGEAL ECHOCARDIOGRAM (TEE);  Surgeon: Minna Merritts, MD;  Location: [facility];  Service: Cardiovascular;  Laterality: N/A;    There were no vitals filed for this visit.   Subjective Assessment - 05/26/20 1705    Subjective glasses this week, less fatigued than last session    Currently in Pain? No/denies                   SLP Education - 05/26/20 1705    Education Details refocusing, new activities    Person(s) Educated Patient    Methods Explanation    Comprehension Verbalized understanding;Verbal cues required              SLP Long Term Goals - 04/15/20 1515      SLP LONG TERM GOAL #1   Title Patient will generate grammatical and cogent sentence to complete  simple/concrete linguistic task with 80% accuracy.    Time 8    Period Weeks    Status New    Target Date 06/11/20      SLP LONG TERM GOAL #2   Title Patient will complete multi-unit processing tasks with 80% accuracy without the need of repetition of task instructions or significant delays in responding.    Time 8    Period Weeks    Status New    Target Date 06/04/20      SLP LONG TERM GOAL #3   Title Patient complete functional reading tasks with 80% accuracy.    Time 8    Period Weeks    Status New    Target Date 06/11/20      SLP LONG TERM GOAL #4   Title Patient complete functional writing tasks with 80% accuracy.    Time 8    Period Weeks    Status New    Target Date 06/11/20      SLP LONG TERM GOAL #5   Title Patient will demonstrate  functional cognitive-communication skills for independent completion of personal responsibilities and leisure activities.    Time 8    Period Weeks    Status New    Target Date 06/11/20            Plan - 05/26/20 1706    Clinical Impression Statement Target word finding, specifically confrontation naming/ listing given one item. Target reading clinician/ own handwriting and functional materials such as recipes. Target auditory comprehension and following directions, review prepositions. Incorporate barrier task to gauge word finding and descrbing images comppletely with cogency and detail.    Speech Therapy Frequency 2x / week    Duration 8 weeks    Treatment/Interventions SLP instruction and feedback;Language facilitation;Patient/family education    Potential to Achieve Goals Good    Potential Considerations Ability to learn/carryover information;Previous level of function;Co-morbidities;Severity of impairments;Cooperation/participation level;Medical prognosis;Family/community support    Consulted and Agree with Plan of Care Patient           Patient will benefit from skilled therapeutic intervention in order to improve the  following deficits and impairments:   Aphasia  Cognitive communication deficit    Problem List Patient Active Problem List   Diagnosis Date Noted  . 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 Delay kidney tx func d/t ATN and fluid overload require acute dialysis ([place]) 01/29/2020  . Hypoglycemia 10/21/2019  . Unresponsiveness 10/21/2019  . GERD (gastroesophageal reflux disease) 10/21/2019  . Hypothermia   . Sepsis with encephalopathy without septic shock ([place])   . Acute metabolic encephalopathy   . Acute on chronic diastolic CHF (congestive heart failure) ([place])   . Acute on chronic respiratory failure with hypoxia ([place]) 07/30/2019  . Rectal bleed 02/13/2019  . Acute respiratory failure with hypoxia ([place]) 12/31/2018  . Aphasia as late effect of cerebrovascular accident (CVA) 05/01/2018  . Seizure ([place]) 03/14/2018  . Stroke ([place]) 03/07/2018  . Slurred speech 11/14/2017  . 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[place]) 11/12/2017  . CAD (coronary artery disease) 11/12/2017  . COPD (chronic obstructive pulmonary disease) ([place]) 11/12/2017  . ESRD (end stage renal disease) ([place]) 11/12/2017  . CVA (cerebral vascular accident) ([place]) 04/14/2016  . Essential hypertension 11/27/2015  . Hyperlipidemia 11/27/2015  . Prolonged Q-T interval on ECG 11/26/2015  . Aphasia complicating stroke 53/64/6803  . Cervical spine arthritis 07/27/2015  . Diabetic retinopathy without macular edema associated with type 2 diabetes mellitus ([place]) 07/27/2015  . Osteoarthritis of spine with radiculopathy, lumbar region 07/27/2015  . Obesity, Class III, BMI 40-49.9 (morbid obesity) ([place]) 07/27/2015  . Vitamin D deficiency 01/21/2011  . Diastolic heart failure ([place]) 10/27/2010   Lawernce Pitts, BS Graduate Clinician Lou Miner 05/27/2020, 1:03 PM  [place] MAIN [facility] SERVICES [address]  [place], [place], [address] Phone: [phone]   Fax:  [phone]   Name: Jermia Rigsby MRN: [id number] Date of Birth: 07/26/1950

## 2020-05-28 ENCOUNTER — Other Ambulatory Visit: Payer: Self-pay

## 2020-05-28 ENCOUNTER — Encounter: Payer: Self-pay | Admitting: Speech Pathology

## 2020-05-28 ENCOUNTER — Ambulatory Visit: Payer: Medicare HMO | Admitting: Speech Pathology

## 2020-05-28 DIAGNOSIS — R4701 Aphasia: Secondary | ICD-10-CM | POA: Diagnosis not present

## 2020-05-28 DIAGNOSIS — R41841 Cognitive communication deficit: Secondary | ICD-10-CM

## 2020-05-28 NOTE — Therapy (Signed)
[place] MAIN [facility] SERVICES [address] [place], [place], [address] Phone: [phone]   Fax:  [phone]  Speech Language Pathology Treatment/Progress Note  Speech Therapy Progress Note   Dates of reporting period  04/14/2020   to   05/28/2020   Patient Details  Name: Cynthia Dean MRN: [id number] Date of Birth: [birth year] Referring Provider (SLP): Dr. Melrose Nakayama   Encounter Date: 05/28/2020   End of Session - 05/28/20 1659    Visit Number 10    Number of Visits 17    Authorization Type Medicare    Authorization Time Period Start 04/14/2020    Authorization - Visit Number 10    Authorization - Number of Visits 10    Progress Note Due on Visit 10    SLP Start Time 3154    SLP Stop Time  1645    SLP Time Calculation (min) 50 min    Activity Tolerance Patient tolerated treatment well           Past Medical History:  Diagnosis Date  . Acute on chronic respiratory failure with hypoxia ([place]) 07/30/2019  . Acute respiratory failure with hypoxia ([place]) 12/31/2018  . Carotid arterial disease ([place])    a. 02/2018 < bilat ICA stenosis.  [name] COPD (chronic obstructive pulmonary disease) ([place])   . Coronary artery disease    a. 11/2015 MV: EF 57%, no ischemia/infarct.  . Cryptogenic stroke ([place])    a. 10/2017 s/p implantable loop recorder (no Afib to date).  . Diabetes mellitus without complication ([place])   . Diarrhea   . ESRD on dialysis ([place])   . GI bleed    a. 01/2019 EGD/Colonoscopy: Mild gastritis. Colitis.  [name] Heart murmur    a. 12/2018 Echo: EF 55-60%, impaired relaxation. Mod dil LA. Mildly dil RA. Mild Ca2+ of AoV.  [name] Hematemesis 06/27/2017  . History of 2019 novel coronavirus disease (COVID-19) 07/30/2019  . Hyperkalemia   . Hyperlipidemia   . Hypertension   . Intractable nausea and vomiting 08/08/2015  . Nausea & vomiting 05/30/2018  . Nausea and vomiting 05/29/2018  . Pneumonia due to COVID-19 virus     Past Surgical History:   Procedure Laterality Date  . A/V FISTULAGRAM Left 12/20/2016   Procedure: A/V Fistulagram;  Surgeon: Katha Cabal, MD;  Location: [place] [facility];  Service: Cardiovascular;  Laterality: Left;  . A/V SHUNT INTERVENTION N/A 12/20/2016   Procedure: A/V Shunt Intervention;  Surgeon: Katha Cabal, MD;  Location: [place] [facility];  Service: Cardiovascular;  Laterality: N/A;  . A/V SHUNTOGRAM Left 09/11/2017   Procedure: A/V SHUNTOGRAM;  Surgeon: Algernon Huxley, MD;  Location: [place] [facility];  Service: Cardiovascular;  Laterality: Left;  . A/V SHUNTOGRAM Left 11/21/2019   Procedure: A/V SHUNTOGRAM;  Surgeon: Algernon Huxley, MD;  Location: [place] [facility];  Service: Cardiovascular;  Laterality: Left;  . BREAST BIOPSY Bilateral 07/19/00   neg  . COLONOSCOPY WITH PROPOFOL N/A 02/16/2019   Procedure: COLONOSCOPY WITH PROPOFOL;  Surgeon: Toledo, Benay Pike, MD;  Location: [facility];  Service: Gastroenterology;  Laterality: N/A;  . ESOPHAGOGASTRODUODENOSCOPY (EGD) WITH PROPOFOL N/A 02/16/2019   Procedure: ESOPHAGOGASTRODUODENOSCOPY (EGD) WITH PROPOFOL;  Surgeon: Toledo, Benay Pike, MD;  Location: [facility];  Service: Gastroenterology;  Laterality: N/A;  . LOOP RECORDER INSERTION N/A 11/16/2017   Procedure: LOOP RECORDER INSERTION;  Surgeon: Deboraha Sprang, MD;  Location: [place] [facility];  Service: Cardiovascular;  Laterality: N/A;  .  PERIPHERAL VASCULAR CATHETERIZATION Left 02/02/2015   Procedure: A/V Shuntogram/Fistulagram;  Surgeon: Algernon Huxley, MD;  Location: [place] [facility];  Service: Cardiovascular;  Laterality: Left;  . PERIPHERAL VASCULAR CATHETERIZATION Left 02/02/2015   Procedure: A/V Shunt Intervention;  Surgeon: Algernon Huxley, MD;  Location: [place] [facility];  Service: Cardiovascular;  Laterality: Left;  . PERIPHERAL VASCULAR CATHETERIZATION Left 03/09/2015   Procedure: A/V Shuntogram/Fistulagram;  Surgeon: Algernon Huxley, MD;  Location: [place] [facility];   Service: Cardiovascular;  Laterality: Left;  . PERIPHERAL VASCULAR CATHETERIZATION N/A 03/09/2015   Procedure: A/V Shunt Intervention;  Surgeon: Algernon Huxley, MD;  Location: [place] [facility];  Service: Cardiovascular;  Laterality: N/A;  . PERIPHERAL VASCULAR CATHETERIZATION Left 04/17/2015   Procedure: Upper Extremity Angiography;  Surgeon: Algernon Huxley, MD;  Location: [place] [facility];  Service: Cardiovascular;  Laterality: Left;  . PERIPHERAL VASCULAR CATHETERIZATION  04/17/2015   Procedure: Upper Extremity Intervention;  Surgeon: Algernon Huxley, MD;  Location: [place] [facility];  Service: Cardiovascular;;  . PERIPHERAL VASCULAR CATHETERIZATION N/A 08/10/2015   Procedure: A/V Shuntogram/Fistulagram;  Surgeon: Algernon Huxley, MD;  Location: [place] [facility];  Service: Cardiovascular;  Laterality: N/A;  . PERIPHERAL VASCULAR CATHETERIZATION N/A 08/10/2015   Procedure: A/V Shunt Intervention;  Surgeon: Algernon Huxley, MD;  Location: [place] [facility];  Service: Cardiovascular;  Laterality: N/A;  . PERIPHERAL VASCULAR CATHETERIZATION Left 04/11/2016   Procedure: A/V Shuntogram/Fistulagram;  Surgeon: Algernon Huxley, MD;  Location: [place] [facility];  Service: Cardiovascular;  Laterality: Left;  . TEE WITHOUT CARDIOVERSION N/A 11/16/2017   Procedure: TRANSESOPHAGEAL ECHOCARDIOGRAM (TEE);  Surgeon: Minna Merritts, MD;  Location: [facility];  Service: Cardiovascular;  Laterality: N/A;    There were no vitals filed for this visit.   Subjective Assessment - 05/28/20 1658    Subjective distracted, positive mood    Currently in Pain? No/denies                 ADULT SLP TREATMENT - 05/28/20 0001      General Information   Behavior/Cognition Pleasant mood;Distractible      Treatment Provided   Treatment provided Cognitive-Linquistic      Cognitive-Linquistic Treatment   Treatment focused on Aphasia    Skilled Treatment WORD FINDING: Confrontation naming 5/5 objects, provided actions  associated with 4/5 objects without prompting. 90% acc matching category to item within category, required refocusing to task. 20% acc providing items from a given category, required reteaching/ refocusing and prompting for the meaning of the category (ex. Peaches for vegetables). COMPREHENSION: Followed verbal directions with 1-2 objects with 80% acc independently. Read 4/4 reading comprehension questions with 50% acc identifying and answering questions. Pt reads her own handwriting. WRITING: Wrote answers to reading comprehension questions, self corrects when necessary.       Assessment / Recommendations / Plan   Plan Continue with current plan of care      Progression Toward Goals   Progression toward goals Progressing toward goals            SLP Education - 05/28/20 1659    Education Details refocusing, providing details/ responses to requests    Person(s) Educated Patient    Methods Explanation    Comprehension Verbalized understanding;Verbal cues required              SLP Long Term Goals - 05/28/20 1715      SLP LONG TERM GOAL #1   Title  Patient will generate grammatical and cogent sentence to complete simple/concrete linguistic task with 80% accuracy.    Status Partially Met    Target Date 06/11/20      SLP LONG TERM GOAL #2   Title Patient will complete multi-unit processing tasks with 80% accuracy without the need of repetition of task instructions or significant delays in responding.    Status Partially Met    Target Date 06/11/20      SLP LONG TERM GOAL #3   Title Patient complete functional reading tasks with 80% accuracy.    Status Partially Met    Target Date 06/11/20      SLP LONG TERM GOAL #4   Title Patient complete functional writing tasks with 80% accuracy.    Status Partially Met    Target Date 06/11/20            Plan - 05/28/20 1659    Clinical Impression Statement Continue to target word finding, providing category and pt lists. Continue to  target functional reading tasks, awareness of refocusing/ repeating questions. Continue to target following directions with a variety of objects and multiple steps (focus, comprehension). Incorporate barrier task to gauge word finding and cogency/ detail.    Speech Therapy Frequency 2x / week    Duration 8 weeks    Treatment/Interventions SLP instruction and feedback;Language facilitation;Patient/family education    Potential to Achieve Goals Good    Potential Considerations Ability to learn/carryover information;Previous level of function;Co-morbidities;Severity of impairments;Cooperation/participation level;Medical prognosis;Family/community support    Consulted and Agree with Plan of Care Patient           Patient will benefit from skilled therapeutic intervention in order to improve the following deficits and impairments:   Aphasia  Cognitive communication deficit    Problem List Patient Active Problem List   Diagnosis Date Noted  . 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 Delay kidney tx func d/t ATN and fluid overload require acute dialysis ([place]) 01/29/2020  . Hypoglycemia 10/21/2019  . Unresponsiveness 10/21/2019  . GERD (gastroesophageal reflux disease) 10/21/2019  . Hypothermia   . Sepsis with encephalopathy without septic shock ([place])   . Acute metabolic encephalopathy   . Acute on chronic diastolic CHF (congestive heart failure) ([place])   . Acute on chronic respiratory failure with hypoxia ([place]) 07/30/2019  . Rectal bleed 02/13/2019  . Acute respiratory failure with hypoxia ([place]) 12/31/2018  . Aphasia as late effect of cerebrovascular accident (CVA) 05/01/2018  . Seizure ([place]) 03/14/2018  . Stroke ([place]) 03/07/2018  . Slurred speech 11/14/2017  . 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[place]) 11/12/2017  . CAD (coronary artery disease) 11/12/2017  . COPD (chronic obstructive pulmonary disease) ([place])  11/12/2017  . ESRD (end stage renal disease) ([place]) 11/12/2017  . CVA (cerebral vascular accident) ([place]) 04/14/2016  . Essential hypertension 11/27/2015  . Hyperlipidemia 11/27/2015  . Prolonged Q-T interval on ECG 11/26/2015  . Aphasia complicating stroke 23/55/7322  . Cervical spine arthritis 07/27/2015  . Diabetic retinopathy without macular edema associated with type 2 diabetes mellitus ([place]) 07/27/2015  . Osteoarthritis of spine with radiculopathy, lumbar region 07/27/2015  . Obesity, Class III, BMI 40-49.9 (morbid obesity) ([place]) 07/27/2015  . Vitamin D deficiency 01/21/2011  . Diastolic heart failure ([place]) 10/27/2010   Lawernce Pitts, BS Graduate Clinician Lou Miner 05/28/2020, 5:18 PM  [place] [address] [place], [place], [address] Phone: [phone]  Fax:  [phone]   Name: Anitha Kreiser MRN: [id number] Date of Birth: 1950/08/07

## 2020-05-29 LAB — CUP PACEART REMOTE DEVICE CHECK
Date Time Interrogation Session: 20211101233720
Implantable Pulse Generator Implant Date: 20190425

## 2020-06-01 ENCOUNTER — Ambulatory Visit (INDEPENDENT_AMBULATORY_CARE_PROVIDER_SITE_OTHER): Payer: Medicare HMO

## 2020-06-01 DIAGNOSIS — I6310 Cerebral infarction due to embolism of unspecified precerebral artery: Secondary | ICD-10-CM

## 2020-06-01 NOTE — Progress Notes (Signed)
[facility]link Summary Report / Loop Recorder 

## 2020-06-02 ENCOUNTER — Encounter: Payer: Self-pay | Admitting: Speech Pathology

## 2020-06-02 ENCOUNTER — Other Ambulatory Visit: Payer: Self-pay

## 2020-06-02 ENCOUNTER — Ambulatory Visit: Payer: Medicare HMO | Admitting: Speech Pathology

## 2020-06-02 DIAGNOSIS — R4701 Aphasia: Secondary | ICD-10-CM | POA: Diagnosis not present

## 2020-06-02 DIAGNOSIS — R41841 Cognitive communication deficit: Secondary | ICD-10-CM

## 2020-06-02 NOTE — Therapy (Signed)
[place] MAIN [facility] SERVICES [address] [place], [place], [address] Phone: [phone]   Fax:  [phone]  Speech Language Pathology Treatment  Patient Details  Name: Cynthia Dean MRN: [id number] Date of Birth: 1950-11-16 Referring Provider (SLP): Dr. Melrose Nakayama   Encounter Date: 06/02/2020   End of Session - 06/02/20 1655    Visit Number 11    Number of Visits 17    Date for SLP Re-Evaluation 06/11/20    Authorization Type Medicare    Authorization Time Period Start 06/02/2020    Authorization - Visit Number 1    Authorization - Number of Visits 10    Progress Note Due on Visit 10    SLP Start Time 1550    SLP Stop Time  1640    SLP Time Calculation (min) 50 min    Activity Tolerance Patient tolerated treatment well           Past Medical History:  Diagnosis Date  . Acute on chronic respiratory failure with hypoxia ([place]) 07/30/2019  . Acute respiratory failure with hypoxia ([place]) 12/31/2018  . Carotid arterial disease ([place])    a. 02/2018 < bilat ICA stenosis.  [name] COPD (chronic obstructive pulmonary disease) ([place])   . Coronary artery disease    a. 11/2015 MV: EF 57%, no ischemia/infarct.  . Cryptogenic stroke ([place])    a. 10/2017 s/p implantable loop recorder (no Afib to date).  . Diabetes mellitus without complication ([place])   . Diarrhea   . ESRD on dialysis ([place])   . GI bleed    a. 01/2019 EGD/Colonoscopy: Mild gastritis. Colitis.  [name] Heart murmur    a. 12/2018 Echo: EF 55-60%, impaired relaxation. Mod dil LA. Mildly dil RA. Mild Ca2+ of AoV.  [name] Hematemesis 06/27/2017  . History of 2019 novel coronavirus disease (COVID-19) 07/30/2019  . Hyperkalemia   . Hyperlipidemia   . Hypertension   . Intractable nausea and vomiting 08/08/2015  . Nausea & vomiting 05/30/2018  . Nausea and vomiting 05/29/2018  . Pneumonia due to COVID-19 virus     Past Surgical History:  Procedure Laterality Date  . A/V FISTULAGRAM Left 12/20/2016    Procedure: A/V Fistulagram;  Surgeon: Katha Cabal, MD;  Location: [place] [facility];  Service: Cardiovascular;  Laterality: Left;  . A/V SHUNT INTERVENTION N/A 12/20/2016   Procedure: A/V Shunt Intervention;  Surgeon: Katha Cabal, MD;  Location: [place] [facility];  Service: Cardiovascular;  Laterality: N/A;  . A/V SHUNTOGRAM Left 09/11/2017   Procedure: A/V SHUNTOGRAM;  Surgeon: Algernon Huxley, MD;  Location: [place] [facility];  Service: Cardiovascular;  Laterality: Left;  . A/V SHUNTOGRAM Left 11/21/2019   Procedure: A/V SHUNTOGRAM;  Surgeon: Algernon Huxley, MD;  Location: [place] [facility];  Service: Cardiovascular;  Laterality: Left;  . BREAST BIOPSY Bilateral 07/19/00   neg  . COLONOSCOPY WITH PROPOFOL N/A 02/16/2019   Procedure: COLONOSCOPY WITH PROPOFOL;  Surgeon: Toledo, Benay Pike, MD;  Location: [facility];  Service: Gastroenterology;  Laterality: N/A;  . ESOPHAGOGASTRODUODENOSCOPY (EGD) WITH PROPOFOL N/A 02/16/2019   Procedure: ESOPHAGOGASTRODUODENOSCOPY (EGD) WITH PROPOFOL;  Surgeon: Toledo, Benay Pike, MD;  Location: [facility];  Service: Gastroenterology;  Laterality: N/A;  . LOOP RECORDER INSERTION N/A 11/16/2017   Procedure: LOOP RECORDER INSERTION;  Surgeon: Deboraha Sprang, MD;  Location: [place] [facility];  Service: Cardiovascular;  Laterality: N/A;  . PERIPHERAL VASCULAR CATHETERIZATION Left 02/02/2015   Procedure: A/V Shuntogram/Fistulagram;  Surgeon: Corene Cornea  Bunnie Domino, MD;  Location: [place] [facility];  Service: Cardiovascular;  Laterality: Left;  . PERIPHERAL VASCULAR CATHETERIZATION Left 02/02/2015   Procedure: A/V Shunt Intervention;  Surgeon: Algernon Huxley, MD;  Location: [place] [facility];  Service: Cardiovascular;  Laterality: Left;  . PERIPHERAL VASCULAR CATHETERIZATION Left 03/09/2015   Procedure: A/V Shuntogram/Fistulagram;  Surgeon: Algernon Huxley, MD;  Location: [place] [facility];  Service: Cardiovascular;  Laterality: Left;  . PERIPHERAL  VASCULAR CATHETERIZATION N/A 03/09/2015   Procedure: A/V Shunt Intervention;  Surgeon: Algernon Huxley, MD;  Location: [place] [facility];  Service: Cardiovascular;  Laterality: N/A;  . PERIPHERAL VASCULAR CATHETERIZATION Left 04/17/2015   Procedure: Upper Extremity Angiography;  Surgeon: Algernon Huxley, MD;  Location: [place] [facility];  Service: Cardiovascular;  Laterality: Left;  . PERIPHERAL VASCULAR CATHETERIZATION  04/17/2015   Procedure: Upper Extremity Intervention;  Surgeon: Algernon Huxley, MD;  Location: [place] [facility];  Service: Cardiovascular;;  . PERIPHERAL VASCULAR CATHETERIZATION N/A 08/10/2015   Procedure: A/V Shuntogram/Fistulagram;  Surgeon: Algernon Huxley, MD;  Location: [place] [facility];  Service: Cardiovascular;  Laterality: N/A;  . PERIPHERAL VASCULAR CATHETERIZATION N/A 08/10/2015   Procedure: A/V Shunt Intervention;  Surgeon: Algernon Huxley, MD;  Location: [place] [facility];  Service: Cardiovascular;  Laterality: N/A;  . PERIPHERAL VASCULAR CATHETERIZATION Left 04/11/2016   Procedure: A/V Shuntogram/Fistulagram;  Surgeon: Algernon Huxley, MD;  Location: [place] [facility];  Service: Cardiovascular;  Laterality: Left;  . TEE WITHOUT CARDIOVERSION N/A 11/16/2017   Procedure: TRANSESOPHAGEAL ECHOCARDIOGRAM (TEE);  Surgeon: Minna Merritts, MD;  Location: [facility];  Service: Cardiovascular;  Laterality: N/A;    There were no vitals filed for this visit.   Subjective Assessment - 06/02/20 1654    Subjective refocus, positive mood                   SLP Education - 06/02/20 1654    Education Details providing details/ full description required    Person(s) Educated Patient    Methods Explanation    Comprehension Verbalized understanding;Verbal cues required              SLP Long Term Goals - 05/28/20 1715      SLP LONG TERM GOAL #1   Title Patient will generate grammatical and cogent sentence to complete simple/concrete linguistic task with 80% accuracy.     Status Partially Met    Target Date 06/11/20      SLP LONG TERM GOAL #2   Title Patient will complete multi-unit processing tasks with 80% accuracy without the need of repetition of task instructions or significant delays in responding.    Status Partially Met    Target Date 06/11/20      SLP LONG TERM GOAL #3   Title Patient complete functional reading tasks with 80% accuracy.    Status Partially Met    Target Date 06/11/20      SLP LONG TERM GOAL #4   Title Patient complete functional writing tasks with 80% accuracy.    Status Partially Met    Target Date 06/11/20            Plan - 06/02/20 1655    Clinical Impression Statement Continue to target word finding, especially confrontation (pt providing examples). Functional reading task, incorporating passages/ sentences with info as well as images. Following two step directions with multiple parts. Continue to utilize barrier task, prompting if needed for more info.  Speech Therapy Frequency 2x / week    Duration 8 weeks    Treatment/Interventions SLP instruction and feedback;Language facilitation;Patient/family education    Potential to Achieve Goals Good    Potential Considerations Ability to learn/carryover information;Previous level of function;Co-morbidities;Severity of impairments;Cooperation/participation level;Medical prognosis;Family/community support    Consulted and Agree with Plan of Care Patient           Patient will benefit from skilled therapeutic intervention in order to improve the following deficits and impairments:   Aphasia  Cognitive communication deficit    Problem List Patient Active Problem List   Diagnosis Date Noted  . 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 Delay kidney tx func d/t ATN and fluid overload require acute dialysis ([place]) 01/29/2020  . Hypoglycemia 10/21/2019  . Unresponsiveness 10/21/2019  . GERD (gastroesophageal reflux disease)  10/21/2019  . Hypothermia   . Sepsis with encephalopathy without septic shock ([place])   . Acute metabolic encephalopathy   . Acute on chronic diastolic CHF (congestive heart failure) ([place])   . Acute on chronic respiratory failure with hypoxia ([place]) 07/30/2019  . Rectal bleed 02/13/2019  . Acute respiratory failure with hypoxia ([place]) 12/31/2018  . Aphasia as late effect of cerebrovascular accident (CVA) 05/01/2018  . Seizure ([place]) 03/14/2018  . Stroke ([place]) 03/07/2018  . Slurred speech 11/14/2017  . 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[place]) 11/12/2017  . CAD (coronary artery disease) 11/12/2017  . COPD (chronic obstructive pulmonary disease) ([place]) 11/12/2017  . ESRD (end stage renal disease) ([place]) 11/12/2017  . CVA (cerebral vascular accident) ([place]) 04/14/2016  . Essential hypertension 11/27/2015  . Hyperlipidemia 11/27/2015  . Prolonged Q-T interval on ECG 11/26/2015  . Aphasia complicating stroke 81/27/5170  . Cervical spine arthritis 07/27/2015  . Diabetic retinopathy without macular edema associated with type 2 diabetes mellitus ([place]) 07/27/2015  . Osteoarthritis of spine with radiculopathy, lumbar region 07/27/2015  . Obesity, Class III, BMI 40-49.9 (morbid obesity) ([place]) 07/27/2015  . Vitamin D deficiency 01/21/2011  . Diastolic heart failure ([place]) 10/27/2010   Lawernce Pitts, BS Graduate Clinician Lou Miner 06/03/2020, 8:36 AM  [place] MAIN [facility] SERVICES [address] [place], [place], [address] Phone: [phone]   Fax:  [phone]   Name: Cynthia Dean MRN: [id number] Date of Birth: Feb 07, 1951

## 2020-06-04 ENCOUNTER — Ambulatory Visit: Payer: Medicare HMO | Admitting: Speech Pathology

## 2020-06-09 ENCOUNTER — Ambulatory Visit: Payer: Medicare HMO | Admitting: Speech Pathology

## 2020-06-09 ENCOUNTER — Other Ambulatory Visit: Payer: Self-pay

## 2020-06-11 ENCOUNTER — Ambulatory Visit: Payer: Medicare HMO | Admitting: Speech Pathology

## 2020-06-16 ENCOUNTER — Ambulatory Visit: Payer: Medicare HMO | Admitting: Speech Pathology

## 2020-06-23 ENCOUNTER — Ambulatory Visit: Payer: Medicare HMO | Admitting: Speech Pathology

## 2020-06-25 ENCOUNTER — Ambulatory Visit: Payer: Medicare HMO | Admitting: Speech Pathology

## 2020-06-30 ENCOUNTER — Encounter: Payer: Medicare HMO | Admitting: Speech Pathology

## 2020-07-02 ENCOUNTER — Encounter: Payer: Medicare HMO | Admitting: Speech Pathology

## 2020-07-05 LAB — CUP PACEART REMOTE DEVICE CHECK
Date Time Interrogation Session: 20211204224234
Implantable Pulse Generator Implant Date: 20190425

## 2020-07-06 ENCOUNTER — Ambulatory Visit (INDEPENDENT_AMBULATORY_CARE_PROVIDER_SITE_OTHER): Payer: Medicare HMO

## 2020-07-06 DIAGNOSIS — I6310 Cerebral infarction due to embolism of unspecified precerebral artery: Secondary | ICD-10-CM | POA: Diagnosis not present

## 2020-07-07 ENCOUNTER — Encounter: Payer: Medicare HMO | Admitting: Speech Pathology

## 2020-07-09 ENCOUNTER — Encounter: Payer: Medicare HMO | Admitting: Speech Pathology

## 2020-07-14 ENCOUNTER — Encounter: Payer: Medicare HMO | Admitting: Speech Pathology

## 2020-07-16 ENCOUNTER — Encounter: Payer: Medicare HMO | Admitting: Speech Pathology

## 2020-07-20 NOTE — Progress Notes (Signed)
[facility]link Summary Report / Loop Recorder 

## 2020-07-21 ENCOUNTER — Encounter: Payer: Medicare HMO | Admitting: Speech Pathology

## 2020-07-23 ENCOUNTER — Encounter: Payer: Medicare HMO | Admitting: Speech Pathology

## 2020-08-10 ENCOUNTER — Ambulatory Visit (INDEPENDENT_AMBULATORY_CARE_PROVIDER_SITE_OTHER): Payer: Medicare HMO

## 2020-08-10 DIAGNOSIS — I6310 Cerebral infarction due to embolism of unspecified precerebral artery: Secondary | ICD-10-CM

## 2020-08-12 LAB — CUP PACEART REMOTE DEVICE CHECK
Date Time Interrogation Session: 20220115235511
Implantable Pulse Generator Implant Date: 20190425

## 2020-08-24 NOTE — Progress Notes (Signed)
[facility]link Summary Report / Loop Recorder 

## 2020-09-11 LAB — CUP PACEART REMOTE DEVICE CHECK
Date Time Interrogation Session: 20220217235841
Implantable Pulse Generator Implant Date: 20190425

## 2020-09-14 ENCOUNTER — Ambulatory Visit (INDEPENDENT_AMBULATORY_CARE_PROVIDER_SITE_OTHER): Payer: Medicare HMO

## 2020-09-14 DIAGNOSIS — I6310 Cerebral infarction due to embolism of unspecified precerebral artery: Secondary | ICD-10-CM

## 2020-09-17 NOTE — Progress Notes (Signed)
[facility]link Summary Report / Loop Recorder 

## 2020-10-14 LAB — CUP PACEART REMOTE DEVICE CHECK
Date Time Interrogation Session: 20220323005957
Implantable Pulse Generator Implant Date: 20190425

## 2020-10-19 ENCOUNTER — Ambulatory Visit (INDEPENDENT_AMBULATORY_CARE_PROVIDER_SITE_OTHER): Payer: Medicare HMO

## 2020-10-19 DIAGNOSIS — I6310 Cerebral infarction due to embolism of unspecified precerebral artery: Secondary | ICD-10-CM

## 2020-10-29 NOTE — Progress Notes (Signed)
[facility]link Summary Report / Loop Recorder 

## 2020-11-16 ENCOUNTER — Ambulatory Visit (INDEPENDENT_AMBULATORY_CARE_PROVIDER_SITE_OTHER): Payer: Medicare HMO

## 2020-11-16 DIAGNOSIS — I6310 Cerebral infarction due to embolism of unspecified precerebral artery: Secondary | ICD-10-CM

## 2020-11-17 LAB — CUP PACEART REMOTE DEVICE CHECK
Date Time Interrogation Session: 20220425010109
Implantable Pulse Generator Implant Date: 20190425

## 2020-12-02 NOTE — Progress Notes (Signed)
[facility]link Summary Report / Loop Recorder 

## 2020-12-21 LAB — CUP PACEART REMOTE DEVICE CHECK
Date Time Interrogation Session: 20220528011054
Implantable Pulse Generator Implant Date: 20190425

## 2020-12-22 ENCOUNTER — Ambulatory Visit (INDEPENDENT_AMBULATORY_CARE_PROVIDER_SITE_OTHER): Payer: Medicare HMO

## 2020-12-22 DIAGNOSIS — I6310 Cerebral infarction due to embolism of unspecified precerebral artery: Secondary | ICD-10-CM

## 2021-01-13 NOTE — Progress Notes (Signed)
[facility]link Summary Report / Loop Recorder 

## 2021-01-22 ENCOUNTER — Ambulatory Visit (INDEPENDENT_AMBULATORY_CARE_PROVIDER_SITE_OTHER): Payer: Medicare HMO

## 2021-01-22 DIAGNOSIS — I6310 Cerebral infarction due to embolism of unspecified precerebral artery: Secondary | ICD-10-CM | POA: Diagnosis not present

## 2021-01-25 LAB — CUP PACEART REMOTE DEVICE CHECK
Date Time Interrogation Session: 20220630011513
Implantable Pulse Generator Implant Date: 20190425

## 2021-02-10 NOTE — Progress Notes (Signed)
[facility]link Summary Report / Loop Recorder 

## 2021-02-23 LAB — CUP PACEART REMOTE DEVICE CHECK
Date Time Interrogation Session: 20220802011948
Implantable Pulse Generator Implant Date: 20190425

## 2021-02-24 ENCOUNTER — Ambulatory Visit (INDEPENDENT_AMBULATORY_CARE_PROVIDER_SITE_OTHER): Payer: Medicare HMO

## 2021-02-24 DIAGNOSIS — I6310 Cerebral infarction due to embolism of unspecified precerebral artery: Secondary | ICD-10-CM

## 2021-03-08 ENCOUNTER — Other Ambulatory Visit: Payer: Self-pay | Admitting: Pediatrics

## 2021-03-08 DIAGNOSIS — Z1231 Encounter for screening mammogram for malignant neoplasm of breast: Secondary | ICD-10-CM

## 2021-03-08 DIAGNOSIS — Z780 Asymptomatic menopausal state: Secondary | ICD-10-CM

## 2021-03-22 NOTE — Progress Notes (Signed)
[facility]link Summary Report / Loop Recorder 

## 2021-03-30 ENCOUNTER — Ambulatory Visit (INDEPENDENT_AMBULATORY_CARE_PROVIDER_SITE_OTHER): Payer: Medicare HMO

## 2021-03-30 DIAGNOSIS — I6310 Cerebral infarction due to embolism of unspecified precerebral artery: Secondary | ICD-10-CM | POA: Diagnosis not present

## 2021-03-30 LAB — CUP PACEART REMOTE DEVICE CHECK
Date Time Interrogation Session: 20220904012130
Implantable Pulse Generator Implant Date: 20190425

## 2021-04-07 NOTE — Progress Notes (Signed)
[facility]link Summary Report / Loop Recorder 

## 2021-05-03 ENCOUNTER — Ambulatory Visit (INDEPENDENT_AMBULATORY_CARE_PROVIDER_SITE_OTHER): Payer: Medicare HMO

## 2021-05-03 DIAGNOSIS — I6310 Cerebral infarction due to embolism of unspecified precerebral artery: Secondary | ICD-10-CM | POA: Diagnosis not present

## 2021-05-04 LAB — CUP PACEART REMOTE DEVICE CHECK
Date Time Interrogation Session: 20221007022229
Implantable Pulse Generator Implant Date: 20190425

## 2021-05-10 ENCOUNTER — Other Ambulatory Visit: Payer: Self-pay

## 2021-05-10 ENCOUNTER — Ambulatory Visit
Admission: EM | Admit: 2021-05-10 | Discharge: 2021-05-10 | Disposition: A | Discharge status: Home / Self Care | Payer: Medicare HMO | Attending: Emergency Medicine | Admitting: Emergency Medicine

## 2021-05-10 DIAGNOSIS — J069 Acute upper respiratory infection, unspecified: Secondary | ICD-10-CM

## 2021-05-10 DIAGNOSIS — J209 Acute bronchitis, unspecified: Secondary | ICD-10-CM | POA: Diagnosis not present

## 2021-05-10 MED ORDER — GUAIFENESIN-CODEINE 100-10 MG/5ML PO SYRP: 5.0000 mL | ORAL_SOLUTION | Freq: Three times a day (TID) | ORAL | 0 refills | Status: DC | PRN

## 2021-05-10 MED ORDER — AMOXICILLIN-POT CLAVULANATE 875-125 MG PO TABS: 1.0000 | ORAL_TABLET | Freq: Two times a day (BID) | ORAL | 0 refills | Status: AC

## 2021-05-10 MED ORDER — BENZONATATE 100 MG PO CAPS: 200.0000 mg | ORAL_CAPSULE | Freq: Three times a day (TID) | ORAL | 0 refills | Status: DC

## 2021-05-10 NOTE — ED Provider Notes (Signed)
[facility]    CSN: [id number] Arrival date & time: 05/10/21  1400      History   Chief Complaint No chief complaint on file.   HPI Cynthia Dean is a 70 y.o. female.   HPI  70 year old female here for evaluation of respiratory complaints.  Patient reports that she has been experiencing a runny nose, nasal congestion, and white nasal discharge, wheezing and shortness of breath with a productive cough for white sputum, and low back pain for the past 2 weeks.  She denies any fever, ear pain, sore throat, or GI complaints.  Significant past medical history including CVA, QT prolongation, and hypoxia leading to respiratory failure.  Past Medical History:  Diagnosis Date   Acute on chronic respiratory failure with hypoxia ([place]) 07/30/2019   Acute respiratory failure with hypoxia ([place]) 12/31/2018   Carotid arterial disease ([place])    a. 02/2018 < bilat ICA stenosis.   COPD (chronic obstructive pulmonary disease) (HCC)    Coronary artery disease    a. 11/2015 MV: EF 57%, no ischemia/infarct.   Cryptogenic stroke ([place])    a. 10/2017 s/p implantable loop recorder (no Afib to date).   Diabetes mellitus without complication ([place])    Diarrhea    ESRD on dialysis [facility])    GI bleed    a. 01/2019 EGD/Colonoscopy: Mild gastritis. Colitis.   Heart murmur    a. 12/2018 Echo: EF 55-60%, impaired relaxation. Mod dil LA. Mildly dil RA. Mild Ca2+ of AoV.   Hematemesis 06/27/2017   History of 2019 novel coronavirus disease (COVID-19) 07/30/2019   Hyperkalemia    Hyperlipidemia    Hypertension    Intractable nausea and vomiting 08/08/2015   Nausea & vomiting 05/30/2018   Nausea and vomiting 05/29/2018   Pneumonia due to COVID-19 virus     Patient Active Problem List   Diagnosis Date Noted   Delay kidney tx func d/t fluid overload requiring acute dialysis ([place]) 01/29/2020   Delay kidney tx func d/t ATN and fluid overload require acute dialysis ([place]) 01/29/2020   Hypoglycemia  10/21/2019   Unresponsiveness 10/21/2019   GERD (gastroesophageal reflux disease) 10/21/2019   Hypothermia    Sepsis with encephalopathy without septic shock (HCC)    Acute metabolic encephalopathy    Acute on chronic diastolic CHF (congestive heart failure) ([place])    Acute on chronic respiratory failure with hypoxia ([place]) 07/30/2019   Rectal bleed 02/13/2019   Acute respiratory failure with hypoxia ([place]) 12/31/2018   Aphasia as late effect of cerebrovascular accident (CVA) 05/01/2018   Seizure ([place]) 03/14/2018   Stroke ([place]) 03/07/2018   Slurred speech 11/14/2017   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[place]) 11/12/2017   CAD (coronary artery disease) 11/12/2017   COPD (chronic obstructive pulmonary disease) ([place]) 11/12/2017   ESRD (end stage renal disease) ([place]) 11/12/2017   CVA (cerebral vascular accident) ([place]) 04/14/2016   Essential hypertension 11/27/2015   Hyperlipidemia 11/27/2015   Prolonged Q-T interval on ECG 02/40/9735   Aphasia complicating stroke 32/99/2426   Cervical spine arthritis 07/27/2015   Diabetic retinopathy without macular edema associated with type 2 diabetes mellitus ([place]) 07/27/2015   Osteoarthritis of spine with radiculopathy, lumbar region 07/27/2015   Obesity, Class III, BMI 40-49.9 (morbid obesity) ([place]) 07/27/2015   Vitamin D deficiency 83/41/9622   Diastolic heart failure ([place]) 10/27/2010    Past Surgical History:  Procedure Laterality Date   A/V FISTULAGRAM Left 12/20/2016   Procedure: A/V Fistulagram;  Surgeon: Delana Meyer,  Dolores Lory, MD;  Location: [place] [facility];  Service: Cardiovascular;  Laterality: Left;   A/V SHUNT INTERVENTION N/A 12/20/2016   Procedure: A/V Shunt Intervention;  Surgeon: Katha Cabal, MD;  Location: [place] [facility];  Service: Cardiovascular;  Laterality: N/A;   A/V SHUNTOGRAM Left 09/11/2017   Procedure: A/V SHUNTOGRAM;  Surgeon: Algernon Huxley, MD;  Location: [place] [facility];  Service:  Cardiovascular;  Laterality: Left;   A/V SHUNTOGRAM Left 11/21/2019   Procedure: A/V SHUNTOGRAM;  Surgeon: Algernon Huxley, MD;  Location: [place] [facility];  Service: Cardiovascular;  Laterality: Left;   BREAST BIOPSY Bilateral 07/19/00   neg   COLONOSCOPY WITH PROPOFOL N/A 02/16/2019   Procedure: COLONOSCOPY WITH PROPOFOL;  Surgeon: Toledo, Benay Pike, MD;  Location: [facility];  Service: Gastroenterology;  Laterality: N/A;   ESOPHAGOGASTRODUODENOSCOPY (EGD) WITH PROPOFOL N/A 02/16/2019   Procedure: ESOPHAGOGASTRODUODENOSCOPY (EGD) WITH PROPOFOL;  Surgeon: Toledo, Benay Pike, MD;  Location: [facility];  Service: Gastroenterology;  Laterality: N/A;   LOOP RECORDER INSERTION N/A 11/16/2017   Procedure: LOOP RECORDER INSERTION;  Surgeon: Deboraha Sprang, MD;  Location: [place] [facility];  Service: Cardiovascular;  Laterality: N/A;   PERIPHERAL VASCULAR CATHETERIZATION Left 02/02/2015   Procedure: A/V Shuntogram/Fistulagram;  Surgeon: Algernon Huxley, MD;  Location: [place] [facility];  Service: Cardiovascular;  Laterality: Left;   PERIPHERAL VASCULAR CATHETERIZATION Left 02/02/2015   Procedure: A/V Shunt Intervention;  Surgeon: Algernon Huxley, MD;  Location: [place] [facility];  Service: Cardiovascular;  Laterality: Left;   PERIPHERAL VASCULAR CATHETERIZATION Left 03/09/2015   Procedure: A/V Shuntogram/Fistulagram;  Surgeon: Algernon Huxley, MD;  Location: [place] [facility];  Service: Cardiovascular;  Laterality: Left;   PERIPHERAL VASCULAR CATHETERIZATION N/A 03/09/2015   Procedure: A/V Shunt Intervention;  Surgeon: Algernon Huxley, MD;  Location: [place] [facility];  Service: Cardiovascular;  Laterality: N/A;   PERIPHERAL VASCULAR CATHETERIZATION Left 04/17/2015   Procedure: Upper Extremity Angiography;  Surgeon: Algernon Huxley, MD;  Location: [facility] [place] [facility];  Service: Cardiovascular;  Laterality: Left;   PERIPHERAL VASCULAR CATHETERIZATION  04/17/2015   Procedure: Upper Extremity Intervention;   Surgeon: Algernon Huxley, MD;  Location: [place] [facility];  Service: Cardiovascular;;   PERIPHERAL VASCULAR CATHETERIZATION N/A 08/10/2015   Procedure: A/V Shuntogram/Fistulagram;  Surgeon: Algernon Huxley, MD;  Location: [place] [facility];  Service: Cardiovascular;  Laterality: N/A;   PERIPHERAL VASCULAR CATHETERIZATION N/A 08/10/2015   Procedure: A/V Shunt Intervention;  Surgeon: Algernon Huxley, MD;  Location: [place] [facility];  Service: Cardiovascular;  Laterality: N/A;   PERIPHERAL VASCULAR CATHETERIZATION Left 04/11/2016   Procedure: A/V Shuntogram/Fistulagram;  Surgeon: Algernon Huxley, MD;  Location: [place] [facility];  Service: Cardiovascular;  Laterality: Left;   TEE WITHOUT CARDIOVERSION N/A 11/16/2017   Procedure: TRANSESOPHAGEAL ECHOCARDIOGRAM (TEE);  Surgeon: Minna Merritts, MD;  Location: [facility];  Service: Cardiovascular;  Laterality: N/A;    OB History   No obstetric history on file.      Home Medications    Prior to Admission medications   Medication Sig Start Date End Date Taking? Authorizing Provider  amoxicillin-clavulanate (AUGMENTIN) 875-125 MG tablet Take 1 tablet by mouth every 12 (twelve) hours for 10 days. 05/10/21 05/20/21 Yes Margarette Canada, NP  aspirin EC 325 MG tablet Take 325 mg by mouth daily. 05/28/19  Yes [provider]  atorvastatin (LIPITOR) 80 MG tablet Take 80 mg by mouth every evening.  Yes [provider]  benzonatate (TESSALON) 100 MG capsule Take 2 capsules (200 mg total) by mouth every 8 (eight) hours. 05/10/21  Yes Margarette Canada, NP  calcium acetate (PHOSLO) 667 MG capsule Take 3 capsules (2,001 mg total) by mouth 3 (three) times daily with meals. 08/11/19  Yes Nicole Kindred A, DO  cholecalciferol (VITAMIN D) 25 MCG tablet Take 1 tablet (1,000 Units total) by mouth daily. 08/11/19  Yes Ezekiel Slocumb, DO  cinacalcet (SENSIPAR) 30 MG tablet Take 30 mg by mouth daily at 6 (six) AM. Take with largest meal 11/10/19  Yes [provider]  clopidogrel (PLAVIX) 75 MG tablet TAKE ONE TABLET BY MOUTH ONCE DAILY Patient taking differently: Take 75 mg by mouth daily. 06/14/16  Yes Stegmayer, Joelene Millin A, PA-C  fluticasone (FLONASE) 50 MCG/ACT nasal spray 2 sprays by Each Nare route daily. Prior to CPAP 08/07/18  Yes [provider]  gabapentin (NEURONTIN) 100 MG capsule Take 1 capsule (100 mg total) by mouth at bedtime. 10/25/19  Yes Patrecia Pour, MD  guaiFENesin-codeine (ROBITUSSIN AC) 100-10 MG/5ML syrup Take 5 mLs by mouth 3 (three) times daily as needed for cough. 05/10/21  Yes Margarette Canada, NP  hydrocortisone (ANUSOL-HC) 2.5 % rectal cream Place 1 application rectally 2 (two) times daily. Patient taking differently: Place 1 application rectally 2 (two) times daily as needed. 09/09/19  Yes Vanga, Tally Due, MD  Ipratropium-Albuterol (COMBIVENT) 20-100 MCG/ACT AERS respimat Inhale 1 puff into the lungs every 6 (six) hours. 08/11/19  Yes Nicole Kindred A, DO  levETIRAcetam (KEPPRA) 1000 MG tablet Take 1 tablet (1,000 mg total) by mouth daily. 03/15/18  Yes Vaughan Basta, MD  levETIRAcetam (KEPPRA) 250 MG tablet Take 250 mg by mouth 3 (three) times a week. 01/16/20  Yes [provider]  lidocaine-prilocaine (EMLA) cream Apply 1 application topically as needed (for port access).    Yes [provider]  omeprazole (PRILOSEC) 20 MG capsule Take 20 mg by mouth 2 (two) times daily.    Yes [provider]  metoprolol succinate (TOPROL-XL) 100 MG 24 hr tablet Take 100 mg by mouth daily. Take with or immediately following a meal.     [provider]    Family History Family History  Problem Relation Age of Onset   Breast cancer Father 71   Stroke Mother    Heart attack Mother    Heart Problems Sister    Breast cancer Cousin 110       1 st cousin. Maternal     Social History Social History   Tobacco Use   Smoking status: Former    Packs/day: 0.00    Types: Cigarettes    Smokeless tobacco: Never  Vaping Use   Vaping Use: Never used  Substance Use Topics   Alcohol use: No   Drug use: No     Allergies   Hydrocodone, Tape, and Tapentadol   Review of Systems Review of Systems  Constitutional:  Negative for activity change, appetite change and fever.  HENT:  Positive for congestion and rhinorrhea. Negative for ear pain and sore throat.   Respiratory:  Positive for cough, shortness of breath and wheezing.   Gastrointestinal:  Negative for diarrhea, nausea and vomiting.  Skin:  Negative for rash.  Hematological: Negative.   Psychiatric/Behavioral: Negative.      Physical Exam Triage Vital Signs ED Triage Vitals  Enc Vitals Group     BP 05/10/21 1427 (!) 162/67     Pulse Rate  05/10/21 1427 68     Resp 05/10/21 1427 16     Temp 05/10/21 1427 98.7 F (37.1 C)     Temp Source 05/10/21 1427 Oral     SpO2 05/10/21 1427 94 %     Weight --      Height --      Head Circumference --      Peak Flow --      Pain Score 05/10/21 1430 8     Pain Loc --      Pain Edu? --      Excl. in [place]? --    No data found.  Updated Vital Signs BP (!) 162/67 (BP Location: Right Arm)   Pulse 68   Temp 98.7 F (37.1 C) (Oral)   Resp 16   SpO2 94%   Visual Acuity Right Eye Distance:   Left Eye Distance:   Bilateral Distance:    Right Eye Near:   Left Eye Near:    Bilateral Near:     Physical Exam Vitals and nursing note reviewed.  Constitutional:      General: She is not in acute distress.    Appearance: Normal appearance. She is obese. She is not ill-appearing.  HENT:     Head: Normocephalic and atraumatic.     Right Ear: Tympanic membrane, ear canal and external ear normal. There is no impacted cerumen.     Left Ear: Tympanic membrane, ear canal and external ear normal. There is no impacted cerumen.     Nose: Congestion and rhinorrhea present.     Mouth/Throat:     Mouth: Mucous membranes are moist.     Pharynx: Oropharynx is clear. Posterior  oropharyngeal erythema present.  Cardiovascular:     Rate and Rhythm: Normal rate and regular rhythm.     Pulses: Normal pulses.     Heart sounds: Normal heart sounds. No murmur heard.   No gallop.  Pulmonary:     Effort: Pulmonary effort is normal.     Breath sounds: Wheezing and rhonchi present. No rales.  Musculoskeletal:     Cervical back: Normal range of motion and neck supple.  Lymphadenopathy:     Cervical: No cervical adenopathy.  Skin:    General: Skin is warm and dry.     Capillary Refill: Capillary refill takes less than 2 seconds.     Findings: No erythema or rash.  Neurological:     General: No focal deficit present.     Mental Status: She is alert and oriented to person, place, and time.  Psychiatric:        Mood and Affect: Mood normal.        Behavior: Behavior normal.        Thought Content: Thought content normal.        Judgment: Judgment normal.     UC Treatments / Results  Labs (all labs ordered are listed, but only abnormal results are displayed) Labs Reviewed - No data to display  EKG   Radiology No results found.  Procedures Procedures (including critical care time)  Medications Ordered in UC Medications - No data to display  Initial Impression / Assessment and Plan / UC Course  I have reviewed the triage vital signs and the nursing notes.  Pertinent labs & imaging results that were available during my care of the patient were reviewed by me and considered in my medical decision making (see chart for details).  Nontoxic-appearing 70 year old female here for evaluation of respiratory  complaints as outlined in HPI above.  Patient's physical exam reveals pearly gray tympanic membranes bilaterally with normal light reflex and clear external auditory canals.  Nasal mucosa is erythematous and edematous with yellow nasal discharge in both nares.  Oropharyngeal exam reveals posterior oropharyngeal erythema.  No cervical of adenopathy appreciated on  exam.  Cardiopulmonary exam reveals wheezes and rhonchi in bilateral upper lung fields.  Patient's exam is consistent with an upper respiratory infection and bronchitis.  Due to the patient's significant medical history, as well as the protracted duration of symptoms, we will do a trial of antibiotics to include Augmentin twice daily for 10 days.  With patient's history of QT prolongation I am uncomfortable providing prescription for albuterol or lev albuterol and I am encouraging her to continue to use her Combivent inhaler.  We will give Tessalon Perles and Cheratussin cough syrup to help with her cough since promethazine increases QT prolongation as well.   Final Clinical Impressions(s) / UC Diagnoses   Final diagnoses:  Acute bronchitis, unspecified organism  Acute upper respiratory infection     Discharge Instructions      Take the Augmentin twice daily with food for 10 days for treatment of your respiratory infection.  Use the Tessalon Perles every 8 hours during the day.  Take them with a small sip of water.  They may give you some numbness to the base of your tongue or a metallic taste in your mouth, this is normal.  Use the Cheratussin cough syrup at bedtime for cough and congestion.  It will make you drowsy so do not take it during the day.  Return for reevaluation or see your primary care provider for any new or worsening symptoms.      ED Prescriptions     Medication Sig Dispense Auth. Provider   amoxicillin-clavulanate (AUGMENTIN) 875-125 MG tablet Take 1 tablet by mouth every 12 (twelve) hours for 10 days. 20 tablet Margarette Canada, NP   benzonatate (TESSALON) 100 MG capsule Take 2 capsules (200 mg total) by mouth every 8 (eight) hours. 21 capsule Margarette Canada, NP   guaiFENesin-codeine (ROBITUSSIN AC) 100-10 MG/5ML syrup Take 5 mLs by mouth 3 (three) times daily as needed for cough. 120 mL Margarette Canada, NP      I have reviewed the PDMP during this encounter.   Margarette Canada, NP 05/10/21 [id number]

## 2021-05-10 NOTE — ED Triage Notes (Signed)
Pt presents with cough and chest congestion x 2 weeks.  Feels unable to break up congestion.  Has tried multiple OTC meds with no relief.   Also reports muscle aches in lower back.  Tylenol did not help.

## 2021-05-10 NOTE — Discharge Instructions (Signed)
Take the Augmentin twice daily with food for 10 days for treatment of your respiratory infection.  Use the Tessalon Perles every 8 hours during the day.  Take them with a small sip of water.  They may give you some numbness to the base of your tongue or a metallic taste in your mouth, this is normal.  Use the Cheratussin cough syrup at bedtime for cough and congestion.  It will make you drowsy so do not take it during the day.  Return for reevaluation or see your primary care provider for any new or worsening symptoms.

## 2021-05-11 NOTE — Progress Notes (Signed)
[facility]link Summary Report / Loop Recorder 

## 2021-05-14 ENCOUNTER — Telehealth: Payer: Self-pay

## 2021-05-14 NOTE — Telephone Encounter (Signed)
LINQ alert received. RRT 10/15 Route to triage LR  Unsuccessful telephone call to patient to discuss linq at RRT. [employer] compliant VM message left requesting call back. Monthly remote appointments discontinued, patient marked inactive in paceart, carelink updated. Will send return kit once patient address is confirmed at call back.

## 2021-05-19 NOTE — Telephone Encounter (Signed)
2nd attempt to reach pt.  No answer, LVMwith device clinic # and hours.

## 2021-05-26 NOTE — Telephone Encounter (Signed)
Certified letter sent 

## 2021-05-26 NOTE — Progress Notes (Signed)
Certified letter sent 

## 2021-06-03 ENCOUNTER — Other Ambulatory Visit: Payer: Self-pay | Admitting: Pediatrics

## 2021-06-03 DIAGNOSIS — Z1231 Encounter for screening mammogram for malignant neoplasm of breast: Secondary | ICD-10-CM

## 2021-08-05 ENCOUNTER — Ambulatory Visit
Admission: RE | Admit: 2021-08-05 | Discharge: 2021-08-05 | Disposition: A | Discharge status: Home / Self Care | Payer: Medicare Other | Source: Ambulatory Visit | Attending: Pediatrics | Admitting: Pediatrics

## 2021-08-05 ENCOUNTER — Other Ambulatory Visit: Payer: Self-pay

## 2021-08-05 DIAGNOSIS — Z1231 Encounter for screening mammogram for malignant neoplasm of breast: Secondary | ICD-10-CM | POA: Insufficient documentation

## 2021-08-11 ENCOUNTER — Other Ambulatory Visit: Payer: Self-pay | Admitting: Pediatrics

## 2021-08-11 DIAGNOSIS — R928 Other abnormal and inconclusive findings on diagnostic imaging of breast: Secondary | ICD-10-CM

## 2021-08-11 DIAGNOSIS — N6320 Unspecified lump in the left breast, unspecified quadrant: Secondary | ICD-10-CM

## 2021-08-22 ENCOUNTER — Ambulatory Visit (INDEPENDENT_AMBULATORY_CARE_PROVIDER_SITE_OTHER): Payer: Medicare Other

## 2021-08-22 ENCOUNTER — Other Ambulatory Visit: Payer: Self-pay

## 2021-08-22 ENCOUNTER — Ambulatory Visit
Admission: EM | Admit: 2021-08-22 | Discharge: 2021-08-22 | Disposition: A | Discharge status: Home / Self Care | Payer: Medicare Other | Attending: Emergency Medicine | Admitting: Emergency Medicine

## 2021-08-22 DIAGNOSIS — S8011XA Contusion of right lower leg, initial encounter: Secondary | ICD-10-CM | POA: Diagnosis not present

## 2021-08-22 DIAGNOSIS — W19XXXA Unspecified fall, initial encounter: Secondary | ICD-10-CM

## 2021-08-22 DIAGNOSIS — M79604 Pain in right leg: Secondary | ICD-10-CM

## 2021-08-22 NOTE — Discharge Instructions (Addendum)
Your x-rays today did not show the presence of any broken bones.  You do have some soft tissue swelling and I believe you have a contusion (bruise) of your shin.  You may apply ice to your shin for 20 minutes at a time 2-3 times a day to help with pain and swelling.  Do not apply the ice directly to your skin, make sure that you have a cloth between your skin and the ice.  You may also take over-the-counter Tylenol according to the package instructions as needed for pain.  Elevate your right lower extremity is much as possible to help with pain and swelling.

## 2021-08-22 NOTE — ED Triage Notes (Signed)
Patient is here for Right Lower leg pain due to fall/injury. DOI: [id number]. Time: 130pm. "Coming in house, trying to carry bags, dropped bags, tripping and falling". Fells onto knees, legs. Not flat on ground. No head injury No laceration. No abrasions "just bruise".

## 2021-08-22 NOTE — ED Provider Notes (Signed)
[facility]    CSN: [id number] Arrival date & time: 08/22/21  2025      History   Chief Complaint Chief Complaint  Patient presents with   Leg Pain    Right    HPI Cynthia Dean is a 71 y.o. female.   HPI  71 year old female here for evaluation of musculoskeletal issues.  Patient reports that she was coming into her house yesterday when she dropped a grocery bag causing her to trip and fall.  She landed on a wooden step on her knees and states that she did not fall flat onto the ground or hit her head.  Since then she has been experiencing pain in her mid shin along with some swelling and bruising.  She states that the pain increases when she tries to bear weight or walk.  She denies any numbness or tingling in her toes.  She is here because she is concerned she may have suffered a fracture.  Past Medical History:  Diagnosis Date   Acute on chronic respiratory failure with hypoxia ([place]) 07/30/2019   Acute respiratory failure with hypoxia ([place]) 12/31/2018   Carotid arterial disease ([place])    a. 02/2018 < bilat ICA stenosis.   COPD (chronic obstructive pulmonary disease) (HCC)    Coronary artery disease    a. 11/2015 MV: EF 57%, no ischemia/infarct.   Cryptogenic stroke ([place])    a. 10/2017 s/p implantable loop recorder (no Afib to date).   Diabetes mellitus without complication ([place])    Diarrhea    ESRD on dialysis [facility])    GI bleed    a. 01/2019 EGD/Colonoscopy: Mild gastritis. Colitis.   Heart murmur    a. 12/2018 Echo: EF 55-60%, impaired relaxation. Mod dil LA. Mildly dil RA. Mild Ca2+ of AoV.   Hematemesis 06/27/2017   History of 2019 novel coronavirus disease (COVID-19) 07/30/2019   Hyperkalemia    Hyperlipidemia    Hypertension    Intractable nausea and vomiting 08/08/2015   Nausea & vomiting 05/30/2018   Nausea and vomiting 05/29/2018   Pneumonia due to COVID-19 virus     Patient Active Problem List   Diagnosis Date Noted   Delay kidney tx func  d/t fluid overload requiring acute dialysis ([place]) 01/29/2020   Delay kidney tx func d/t ATN and fluid overload require acute dialysis ([place]) 01/29/2020   Hypoglycemia 10/21/2019   Unresponsiveness 10/21/2019   GERD (gastroesophageal reflux disease) 10/21/2019   Hypothermia    Sepsis with encephalopathy without septic shock (HCC)    Acute metabolic encephalopathy    Acute on chronic diastolic CHF (congestive heart failure) ([place])    Acute on chronic respiratory failure with hypoxia ([place]) 07/30/2019   Rectal bleed 02/13/2019   Acute respiratory failure with hypoxia ([place]) 12/31/2018   Aphasia as late effect of cerebrovascular accident (CVA) 05/01/2018   Seizure ([place]) 03/14/2018   Stroke ([place]) 03/07/2018   Slurred speech 11/14/2017   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[place]) 11/12/2017   CAD (coronary artery disease) 11/12/2017   COPD (chronic obstructive pulmonary disease) ([employer]) 11/12/2017   ESRD (end stage renal disease) ([place]) 11/12/2017   CVA (cerebral vascular accident) ([place]) 04/14/2016   Essential hypertension 11/27/2015   Hyperlipidemia 11/27/2015   Prolonged Q-T interval on ECG 42/70/6237   Aphasia complicating stroke 62/83/1517   Cervical spine arthritis 07/27/2015   Diabetic retinopathy without macular edema associated with type 2 diabetes mellitus ([place]) 07/27/2015   Osteoarthritis of spine with radiculopathy, lumbar  region 07/27/2015   Obesity, Class III, BMI 40-49.9 (morbid obesity) ([place]) 07/27/2015   Vitamin D deficiency 32/67/1245   Diastolic heart failure ([place]) 10/27/2010    Past Surgical History:  Procedure Laterality Date   A/V FISTULAGRAM Left 12/20/2016   Procedure: A/V Fistulagram;  Surgeon: Katha Cabal, MD;  Location: [place] [facility];  Service: Cardiovascular;  Laterality: Left;   A/V SHUNT INTERVENTION N/A 12/20/2016   Procedure: A/V Shunt Intervention;  Surgeon: Katha Cabal, MD;  Location: [place] [facility];  Service:  Cardiovascular;  Laterality: N/A;   A/V SHUNTOGRAM Left 09/11/2017   Procedure: A/V SHUNTOGRAM;  Surgeon: Algernon Huxley, MD;  Location: [place] [facility];  Service: Cardiovascular;  Laterality: Left;   A/V SHUNTOGRAM Left 11/21/2019   Procedure: A/V SHUNTOGRAM;  Surgeon: Algernon Huxley, MD;  Location: [place] [facility];  Service: Cardiovascular;  Laterality: Left;   BREAST BIOPSY Bilateral 07/19/00   neg   COLONOSCOPY WITH PROPOFOL N/A 02/16/2019   Procedure: COLONOSCOPY WITH PROPOFOL;  Surgeon: Toledo, Benay Pike, MD;  Location: [facility];  Service: Gastroenterology;  Laterality: N/A;   ESOPHAGOGASTRODUODENOSCOPY (EGD) WITH PROPOFOL N/A 02/16/2019   Procedure: ESOPHAGOGASTRODUODENOSCOPY (EGD) WITH PROPOFOL;  Surgeon: Toledo, Benay Pike, MD;  Location: [facility];  Service: Gastroenterology;  Laterality: N/A;   LOOP RECORDER INSERTION N/A 11/16/2017   Procedure: LOOP RECORDER INSERTION;  Surgeon: Deboraha Sprang, MD;  Location: [place] [facility];  Service: Cardiovascular;  Laterality: N/A;   PERIPHERAL VASCULAR CATHETERIZATION Left 02/02/2015   Procedure: A/V Shuntogram/Fistulagram;  Surgeon: Algernon Huxley, MD;  Location: [place] [facility];  Service: Cardiovascular;  Laterality: Left;   PERIPHERAL VASCULAR CATHETERIZATION Left 02/02/2015   Procedure: A/V Shunt Intervention;  Surgeon: Algernon Huxley, MD;  Location: [place] [facility];  Service: Cardiovascular;  Laterality: Left;   PERIPHERAL VASCULAR CATHETERIZATION Left 03/09/2015   Procedure: A/V Shuntogram/Fistulagram;  Surgeon: Algernon Huxley, MD;  Location: [place] [facility];  Service: Cardiovascular;  Laterality: Left;   PERIPHERAL VASCULAR CATHETERIZATION N/A 03/09/2015   Procedure: A/V Shunt Intervention;  Surgeon: Algernon Huxley, MD;  Location: [place] [facility];  Service: Cardiovascular;  Laterality: N/A;   PERIPHERAL VASCULAR CATHETERIZATION Left 04/17/2015   Procedure: Upper Extremity Angiography;  Surgeon: Algernon Huxley, MD;   Location: [place] [facility];  Service: Cardiovascular;  Laterality: Left;   PERIPHERAL VASCULAR CATHETERIZATION  04/17/2015   Procedure: Upper Extremity Intervention;  Surgeon: Algernon Huxley, MD;  Location: [place] [facility];  Service: Cardiovascular;;   PERIPHERAL VASCULAR CATHETERIZATION N/A 08/10/2015   Procedure: A/V Shuntogram/Fistulagram;  Surgeon: Algernon Huxley, MD;  Location: [place] [facility];  Service: Cardiovascular;  Laterality: N/A;   PERIPHERAL VASCULAR CATHETERIZATION N/A 08/10/2015   Procedure: A/V Shunt Intervention;  Surgeon: Algernon Huxley, MD;  Location: [place] [facility];  Service: Cardiovascular;  Laterality: N/A;   PERIPHERAL VASCULAR CATHETERIZATION Left 04/11/2016   Procedure: A/V Shuntogram/Fistulagram;  Surgeon: Algernon Huxley, MD;  Location: [place] [facility];  Service: Cardiovascular;  Laterality: Left;   TEE WITHOUT CARDIOVERSION N/A 11/16/2017   Procedure: TRANSESOPHAGEAL ECHOCARDIOGRAM (TEE);  Surgeon: Minna Merritts, MD;  Location: [facility];  Service: Cardiovascular;  Laterality: N/A;    OB History   No obstetric history on file.      Home Medications    Prior to Admission medications   Medication Sig Start Date End Date Taking? Authorizing Provider  aspirin EC 325 MG tablet  Take 325 mg by mouth daily. 05/28/19  Yes [provider]  atorvastatin (LIPITOR) 80 MG tablet Take 80 mg by mouth every evening.    Yes [provider]  benzonatate (TESSALON) 100 MG capsule Take 2 capsules (200 mg total) by mouth every 8 (eight) hours. 05/10/21  Yes Margarette Canada, NP  calcium acetate (PHOSLO) 667 MG capsule Take 3 capsules (2,001 mg total) by mouth 3 (three) times daily with meals. 08/11/19  Yes Nicole Kindred A, DO  cholecalciferol (VITAMIN D) 25 MCG tablet Take 1 tablet (1,000 Units total) by mouth daily. 08/11/19  Yes Ezekiel Slocumb, DO  cinacalcet (SENSIPAR) 30 MG tablet Take 30 mg by mouth daily at 6 (six) AM. Take with largest meal 11/10/19   Yes [provider]  clopidogrel (PLAVIX) 75 MG tablet TAKE ONE TABLET BY MOUTH ONCE DAILY Patient taking differently: Take 75 mg by mouth daily. 06/14/16  Yes Stegmayer, Joelene Millin A, PA-C  fluticasone (FLONASE) 50 MCG/ACT nasal spray 2 sprays by Each Nare route daily. Prior to CPAP 08/07/18  Yes [provider]  gabapentin (NEURONTIN) 100 MG capsule Take 1 capsule (100 mg total) by mouth at bedtime. 10/25/19  Yes Patrecia Pour, MD  hydrocortisone (ANUSOL-HC) 2.5 % rectal cream Place 1 application rectally 2 (two) times daily. Patient taking differently: Place 1 application rectally 2 (two) times daily as needed. 09/09/19  Yes Vanga, Tally Due, MD  levETIRAcetam (KEPPRA) 1000 MG tablet Take 1 tablet (1,000 mg total) by mouth daily. 03/15/18  Yes Vaughan Basta, MD  levETIRAcetam (KEPPRA) 250 MG tablet Take 250 mg by mouth 3 (three) times a week. 01/16/20  Yes [provider]  lidocaine-prilocaine (EMLA) cream Apply 1 application topically as needed (for port access).    Yes [provider]  metoprolol succinate (TOPROL-XL) 100 MG 24 hr tablet Take 100 mg by mouth daily. Take with or immediately following a meal.    Yes [provider]  omeprazole (PRILOSEC) 20 MG capsule Take 20 mg by mouth 2 (two) times daily.    Yes [provider]  guaiFENesin-codeine (ROBITUSSIN AC) 100-10 MG/5ML syrup Take 5 mLs by mouth 3 (three) times daily as needed for cough. 05/10/21   Margarette Canada, NP  Ipratropium-Albuterol (COMBIVENT) 20-100 MCG/ACT AERS respimat Inhale 1 puff into the lungs every 6 (six) hours. 08/11/19   Ezekiel Slocumb, DO    Family History Family History  Problem Relation Age of Onset   Breast cancer Father 62   Stroke Mother    Heart attack Mother    Heart Problems Sister    Breast cancer Cousin 1       1 st cousin. Maternal     Social History Social History   Tobacco Use   Smoking status: Former    Packs/day: 0.00    Types:  Cigarettes   Smokeless tobacco: Never  Vaping Use   Vaping Use: Never used  Substance Use Topics   Alcohol use: No   Drug use: No     Allergies   Oxycodone, Oxycodone-acetaminophen, Wound dressing adhesive, Hydrocodone, Tape, and Tapentadol   Review of Systems Review of Systems  Musculoskeletal:  Positive for myalgias.  Skin:  Positive for color change.  Neurological:  Negative for weakness and numbness.  Hematological: Negative.   Psychiatric/Behavioral: Negative.      Physical Exam Triage Vital Signs ED Triage Vitals  Enc Vitals Group     BP 08/22/21 0836 (!) 177/77     Pulse Rate  08/22/21 0836 (!) 112     Resp 08/22/21 0836 20     Temp 08/22/21 0836 98.7 F (37.1 C)     Temp Source 08/22/21 0836 Oral     SpO2 08/22/21 0836 96 %     Weight 08/22/21 0835 276 lb 0.3 oz (125.2 kg)     Height 08/22/21 0835 5\' 8"  (1.727 m)     Head Circumference --      Peak Flow --      Pain Score 08/22/21 0835 3     Pain Loc --      Pain Edu? --      Excl. in [place]? --    No data found.  Updated Vital Signs BP (!) 177/77 (BP Location: Right Arm)    Pulse (!) 112    Temp 98.7 F (37.1 C) (Oral)    Resp 20    Ht 5\' 8"  (1.727 m)    Wt 276 lb 0.3 oz (125.2 kg)    SpO2 96%    BMI 41.97 kg/m   Visual Acuity Right Eye Distance:   Left Eye Distance:   Bilateral Distance:    Right Eye Near:   Left Eye Near:    Bilateral Near:     Physical Exam Vitals and nursing note reviewed.  Constitutional:      General: She is not in acute distress.    Appearance: Normal appearance. She is not ill-appearing.  Musculoskeletal:        General: Swelling, tenderness and signs of injury present. No deformity. Normal range of motion.     Right lower leg: No edema.  Skin:    General: Skin is warm and dry.     Capillary Refill: Capillary refill takes less than 2 seconds.     Findings: Bruising present.  Neurological:     General: No focal deficit present.     Mental Status: She is alert and  oriented to person, place, and time.     Sensory: No sensory deficit.     Motor: No weakness.  Psychiatric:        Mood and Affect: Mood normal.        Behavior: Behavior normal.        Thought Content: Thought content normal.        Judgment: Judgment normal.     UC Treatments / Results  Labs (all labs ordered are listed, but only abnormal results are displayed) Labs Reviewed - No data to display  EKG   Radiology DG Tibia/Fibula Right  Result Date: 08/22/2021 CLINICAL DATA:  Right leg pain after fall. EXAM: RIGHT TIBIA AND FIBULA - 2 VIEW COMPARISON:  None. FINDINGS: There is no evidence of fracture or other focal bone lesions. Soft tissues are unremarkable. IMPRESSION: Negative. Electronically Signed   By: Marijo Conception M.D.   On: 08/22/2021 10:06    Procedures Procedures (including critical care time)  Medications Ordered in UC Medications - No data to display  Initial Impression / Assessment and Plan / UC Course  I have reviewed the triage vital signs and the nursing notes.  Pertinent labs & imaging results that were available during my care of the patient were reviewed by me and considered in my medical decision making (see chart for details).  Patient is a very pleasant, nontoxic-appearing 71 year old female here for evaluation of right lower extremity pain that started yesterday after suffering a ground-level fall onto a wooden surface.  She states that she hit her knees  and lower legs but did not fall flat on the ground or hit her head.  Since then she has been experiencing pain in the distal anterior right shin with some associated swelling and ecchymosis.  The area is tender to palpation but there is no appreciable crepitus on exam.  Patient is able to dorsiflex and plantarflex her foot and she complains of pain with dorsiflexion in the area of swelling.  There is no tenderness when palpating the proximal tibia or with palpation of the talus and motor joint.  Patient's  DP and PT pulses are 2+.  There is no pain appreciable in the calf.  There is no appreciable swelling other than the localized area on the anterior shin.  Suspect patient has soft tissue injury but will obtain radiograph of right tibia and fibula to rule out bony abnormality.  Left tib-fib films independently reviewed and evaluated by me.  Impression: No evidence of fracture or dislocation noted.  Patient does have atherosclerotic plaques present in some of her peripheral vessels.  Radiology overread is pending. Radiology impression is no evidence of fracture or focal bone lesion.  Soft tissues are unremarkable.  Negative x-ray.  Will discharge patient home with a diagnosis of contusion of right shin.  We will advised patient to take over-the-counter Tylenol as needed for pain, keep her right leg elevated is much as possible, and apply ice for 20s at a time 2-3 times a day to help with pain and swelling.   Final Clinical Impressions(s) / UC Diagnoses   Final diagnoses:  Contusion of right lower leg, initial encounter     Discharge Instructions      Your x-rays today did not show the presence of any broken bones.  You do have some soft tissue swelling and I believe you have a contusion (bruise) of your shin.  You may apply ice to your shin for 20 minutes at a time 2-3 times a day to help with pain and swelling.  Do not apply the ice directly to your skin, make sure that you have a cloth between your skin and the ice.  You may also take over-the-counter Tylenol according to the package instructions as needed for pain.  Elevate your right lower extremity is much as possible to help with pain and swelling.     ED Prescriptions   None    PDMP not reviewed this encounter.   Margarette Canada, NP 08/22/21 1018

## 2021-10-08 ENCOUNTER — Ambulatory Visit
Admission: RE | Admit: 2021-10-08 | Discharge: 2021-10-08 | Disposition: A | Discharge status: Home / Self Care | Payer: Medicare Other | Source: Ambulatory Visit | Attending: Pediatrics | Admitting: Pediatrics

## 2021-10-08 ENCOUNTER — Other Ambulatory Visit: Payer: Self-pay

## 2021-10-08 DIAGNOSIS — R928 Other abnormal and inconclusive findings on diagnostic imaging of breast: Secondary | ICD-10-CM

## 2021-10-08 DIAGNOSIS — N6320 Unspecified lump in the left breast, unspecified quadrant: Secondary | ICD-10-CM

## 2021-10-20 ENCOUNTER — Other Ambulatory Visit: Payer: Self-pay | Admitting: Pediatrics

## 2021-10-20 DIAGNOSIS — R928 Other abnormal and inconclusive findings on diagnostic imaging of breast: Secondary | ICD-10-CM

## 2021-10-20 DIAGNOSIS — N63 Unspecified lump in unspecified breast: Secondary | ICD-10-CM

## 2021-10-20 DIAGNOSIS — R599 Enlarged lymph nodes, unspecified: Secondary | ICD-10-CM

## 2021-11-03 ENCOUNTER — Ambulatory Visit
Admission: RE | Admit: 2021-11-03 | Discharge: 2021-11-03 | Disposition: A | Discharge status: Home / Self Care | Payer: Medicare Other | Source: Ambulatory Visit | Attending: Pediatrics | Admitting: Pediatrics

## 2021-11-03 DIAGNOSIS — R928 Other abnormal and inconclusive findings on diagnostic imaging of breast: Secondary | ICD-10-CM | POA: Diagnosis not present

## 2021-11-03 DIAGNOSIS — C50812 Malignant neoplasm of overlapping sites of left female breast: Secondary | ICD-10-CM | POA: Insufficient documentation

## 2021-11-03 DIAGNOSIS — N63 Unspecified lump in unspecified breast: Secondary | ICD-10-CM | POA: Insufficient documentation

## 2021-11-03 DIAGNOSIS — C773 Secondary and unspecified malignant neoplasm of axilla and upper limb lymph nodes: Secondary | ICD-10-CM | POA: Insufficient documentation

## 2021-11-03 DIAGNOSIS — Z170 Estrogen receptor positive status [ER+]: Secondary | ICD-10-CM | POA: Insufficient documentation

## 2021-11-03 DIAGNOSIS — R599 Enlarged lymph nodes, unspecified: Secondary | ICD-10-CM

## 2021-11-03 HISTORY — PX: BREAST BIOPSY: SHX20

## 2021-11-05 LAB — SURGICAL PATHOLOGY

## 2021-11-09 DIAGNOSIS — C50919 Malignant neoplasm of unspecified site of unspecified female breast: Secondary | ICD-10-CM

## 2021-11-09 NOTE — Progress Notes (Signed)
Phoned patient to initiate navigation.  Patient requests referral to [facility] as her primary provider is in [facility] system.  Notified nurse at Dr. Evelena Asa office of patient request.  ?

## 2021-11-11 NOTE — Progress Notes (Signed)
Patient called back and requested to be seen at [facility].  Scheduled with Dr. Janese Banks, and Dr. Christian Mate.   Notified patient's primary provider of change. ?

## 2021-11-16 ENCOUNTER — Inpatient Hospital Stay: Payer: Medicare Other

## 2021-11-16 ENCOUNTER — Inpatient Hospital Stay: Payer: Medicare Other | Admitting: Oncology

## 2021-11-17 ENCOUNTER — Encounter: Payer: Self-pay | Admitting: Emergency Medicine

## 2021-11-18 ENCOUNTER — Ambulatory Visit: Payer: Medicare HMO | Admitting: Surgery

## 2022-01-26 ENCOUNTER — Emergency Department: Payer: Medicare Other

## 2022-01-26 ENCOUNTER — Emergency Department
Admission: EM | Admit: 2022-01-26 | Discharge: 2022-01-26 | Disposition: A | Discharge status: Home / Self Care | Payer: Medicare Other | Attending: Emergency Medicine | Admitting: Emergency Medicine

## 2022-01-26 ENCOUNTER — Other Ambulatory Visit: Payer: Self-pay

## 2022-01-26 DIAGNOSIS — R109 Unspecified abdominal pain: Secondary | ICD-10-CM | POA: Diagnosis present

## 2022-01-26 DIAGNOSIS — R1084 Generalized abdominal pain: Secondary | ICD-10-CM | POA: Insufficient documentation

## 2022-01-26 DIAGNOSIS — R531 Weakness: Secondary | ICD-10-CM | POA: Diagnosis not present

## 2022-01-26 DIAGNOSIS — Z992 Dependence on renal dialysis: Secondary | ICD-10-CM | POA: Insufficient documentation

## 2022-01-26 DIAGNOSIS — R197 Diarrhea, unspecified: Secondary | ICD-10-CM | POA: Diagnosis not present

## 2022-01-26 LAB — CBC WITH DIFFERENTIAL/PLATELET
Abs Immature Granulocytes: 0.3 10*3/uL — ABNORMAL HIGH (ref 0.00–0.07)
Basophils Absolute: 0.1 10*3/uL (ref 0.0–0.1)
Basophils Relative: 1 %
Eosinophils Absolute: 0.1 10*3/uL (ref 0.0–0.5)
Eosinophils Relative: 1 %
HCT: 28.4 % — ABNORMAL LOW (ref 36.0–46.0)
Hemoglobin: 8.8 g/dL — ABNORMAL LOW (ref 12.0–15.0)
Immature Granulocytes: 2 %
Lymphocytes Relative: 12 %
Lymphs Abs: 1.8 10*3/uL (ref 0.7–4.0)
MCH: 28.1 pg (ref 26.0–34.0)
MCHC: 31 g/dL (ref 30.0–36.0)
MCV: 90.7 fL (ref 80.0–100.0)
Monocytes Absolute: 1.1 10*3/uL — ABNORMAL HIGH (ref 0.1–1.0)
Monocytes Relative: 7 %
Neutro Abs: 11.7 10*3/uL — ABNORMAL HIGH (ref 1.7–7.7)
Neutrophils Relative %: 77 %
Platelets: 290 10*3/uL (ref 150–400)
RBC: 3.13 MIL/uL — ABNORMAL LOW (ref 3.87–5.11)
RDW: 15.7 % — ABNORMAL HIGH (ref 11.5–15.5)
WBC: 15.1 10*3/uL — ABNORMAL HIGH (ref 4.0–10.5)
nRBC: 0 % (ref 0.0–0.2)

## 2022-01-26 LAB — LIPASE, BLOOD: Lipase: 45 U/L (ref 11–51)

## 2022-01-26 LAB — BASIC METABOLIC PANEL WITH GFR
Anion gap: 15 (ref 5–15)
BUN: 57 mg/dL — ABNORMAL HIGH (ref 8–23)
CO2: 20 mmol/L — ABNORMAL LOW (ref 22–32)
Calcium: 9 mg/dL (ref 8.9–10.3)
Chloride: 100 mmol/L (ref 98–111)
Creatinine, Ser: 14.14 mg/dL — ABNORMAL HIGH (ref 0.44–1.00)
GFR, Estimated: 3 mL/min — ABNORMAL LOW
Glucose, Bld: 179 mg/dL — ABNORMAL HIGH (ref 70–99)
Potassium: 4.8 mmol/L (ref 3.5–5.1)
Sodium: 135 mmol/L (ref 135–145)

## 2022-01-26 LAB — CBC
HCT: 28.1 % — ABNORMAL LOW (ref 36.0–46.0)
Hemoglobin: 8.8 g/dL — ABNORMAL LOW (ref 12.0–15.0)
MCH: 28.1 pg (ref 26.0–34.0)
MCHC: 31.3 g/dL (ref 30.0–36.0)
MCV: 89.8 fL (ref 80.0–100.0)
Platelets: 291 10*3/uL (ref 150–400)
RBC: 3.13 MIL/uL — ABNORMAL LOW (ref 3.87–5.11)
RDW: 15.7 % — ABNORMAL HIGH (ref 11.5–15.5)
WBC: 15 10*3/uL — ABNORMAL HIGH (ref 4.0–10.5)
nRBC: 0 % (ref 0.0–0.2)

## 2022-01-26 LAB — HEPATIC FUNCTION PANEL
ALT: 12 U/L (ref 0–44)
AST: 13 U/L — ABNORMAL LOW (ref 15–41)
Albumin: 3 g/dL — ABNORMAL LOW (ref 3.5–5.0)
Alkaline Phosphatase: 80 U/L (ref 38–126)
Bilirubin, Direct: <0.1 mg/dL (ref 0.0–0.2)
Total Bilirubin: 0.7 mg/dL (ref 0.3–1.2)
Total Protein: 7.1 g/dL (ref 6.5–8.1)

## 2022-01-26 LAB — TROPONIN I (HIGH SENSITIVITY)
Troponin I (High Sensitivity): 30 ng/L — ABNORMAL HIGH
Troponin I (High Sensitivity): 31 ng/L — ABNORMAL HIGH

## 2022-01-26 MED ORDER — ALTEPLASE 2 MG IJ SOLR: 2.0000 mg | Freq: Once | INTRAMUSCULAR | Status: DC | PRN

## 2022-01-26 MED ORDER — IOHEXOL 300 MG/ML  SOLN
100.0000 mL | Freq: Once | INTRAMUSCULAR | Status: AC | PRN
  Administered 2022-01-26: 100 mL via INTRAVENOUS

## 2022-01-26 MED ORDER — PENTAFLUOROPROP-TETRAFLUOROETH EX AERO: 1.0000 | INHALATION_SPRAY | CUTANEOUS | Status: DC | PRN

## 2022-01-26 MED ORDER — LIDOCAINE HCL (PF) 1 % IJ SOLN: 5.0000 mL | INTRAMUSCULAR | Status: DC | PRN

## 2022-01-26 MED ORDER — CHLORHEXIDINE GLUCONATE CLOTH 2 % EX PADS: 6.0000 | MEDICATED_PAD | Freq: Every day | CUTANEOUS | Status: DC

## 2022-01-26 MED ORDER — HEPARIN SODIUM (PORCINE) 1000 UNIT/ML DIALYSIS: 1000.0000 [IU] | INTRAMUSCULAR | Status: DC | PRN

## 2022-01-26 MED ORDER — LIDOCAINE-PRILOCAINE 2.5-2.5 % EX CREA: 1.0000 | TOPICAL_CREAM | CUTANEOUS | Status: DC | PRN

## 2022-01-26 NOTE — ED Notes (Signed)
Hats placed in the toilet, pt aware that a stool and urine test have been ordered, pt oriented to the call bell and asked to call out when she has the urge to go  Pt's husband taken a cola and a cup of ice upon request

## 2022-01-26 NOTE — ED Provider Notes (Signed)
Patient CT scan without any obvious cause, questioned possible UTI and pyelo. Patient states she does make small amount of urine but unable to produce any here. Additionally unable to produce stool sample, however recent stool sample performed at [facility] was reviewed and it was negative. Patient did arrange for dialysis to be performed tomorrow at her dialysis center. They did not want to continue to wait for dialysis to be performed here.    Nance Pear, MD 01/26/22 [phone]

## 2022-01-26 NOTE — ED Provider Triage Note (Signed)
Emergency Medicine Provider Triage Evaluation Note  Cynthia Dean , a 71 y.o. female  was evaluated in triage.  Pt complains of shortness of breath, chest pain, diarrhea, abdominal pain, and weakness. Symptoms present for several weeks. She has missed dialysis twice this week because she hasn't felt well.   Physical Exam  There were no vitals taken for this visit. Gen:   Awake, no distress   Resp:  Normal effort  MSK:   Moves extremities without difficulty  Other:    Medical Decision Making  Medically screening exam initiated at 9:27 AM.  Appropriate orders placed.  [name] was informed that the remainder of the evaluation will be completed by another provider, this initial triage assessment does not replace that evaluation, and the importance of remaining in the ED until their evaluation is complete.    Victorino Dike, FNP 01/26/22 0930

## 2022-01-26 NOTE — Progress Notes (Addendum)
Patient has an order for hemodialysis. ED nurse called for report. Machine set up. However, ED nurse called back saying patient is not aware that she is going to have a dialysis today. So, patient  refused dialysis as she is going to be discharged tonight and that she already has a dialysis schedule tomorrow. NP Shantelle and DR. [place] notified.

## 2022-01-26 NOTE — Discharge Instructions (Addendum)
Please seek medical attention for any high fevers, chest pain, shortness of breath, change in behavior, persistent vomiting, bloody stool or any other new or concerning symptoms.  

## 2022-01-26 NOTE — ED Notes (Signed)
Pt states that she has been having diarrhea for the past month, pt has been to a couple other hospitals to help determine the cause of the diarrhea, has been placed on medication to help with the diarrhea but cont to have recurrent loose stools, pt states that she has been having 6-7 episodes a day, denies new medications prior to the diarrhea starting

## 2022-01-26 NOTE — ED Triage Notes (Signed)
Pt comes with c/o abdominal pain and weakness. Pt states diarrhea. Pt denies any N/V. Pt MWF dialysis. Pt missed MOnday treatment. Pt states some SOB. Pt states mid sternal CP and lower belly pain.

## 2022-01-26 NOTE — ED Notes (Signed)
Pt A&O, IV removed, pt given discharge instructions, pt assisted to vehicle by RN. 

## 2022-01-26 NOTE — ED Provider Notes (Signed)
[facility] Provider Note    Event Date/Time   First MD Initiated Contact with Patient 01/26/22 1405     (approximate)   History   Abdominal Pain and Weakness   HPI  Cynthia Dean is a 71 y.o. female patient complains of pain in the middle abdomen.  She also has some weakness.  She has diarrhea.  She has been having diarrhea for the last month or so.  She has been to hospitals about this.  The pain however seems to be worse.  She missed dialysis Monday and today because she felt bad.  She told triage she had midsternal chest pain but did not tell me anything except for mid abdominal pain.  Diarrhea 6 or 7 episodes a day.      Physical Exam   Triage Vital Signs: ED Triage Vitals  Enc Vitals Group     BP 01/26/22 0929 (!) 158/70     Pulse Rate 01/26/22 0929 71     Resp 01/26/22 0929 18     Temp 01/26/22 0929 98.9 F (37.2 C)     Temp src --      SpO2 01/26/22 0929 96 %     Weight --      Height --      Head Circumference --      Peak Flow --      Pain Score 01/26/22 0926 5     Pain Loc --      Pain Edu? --      Excl. in [place]? --     Most recent vital signs: Vitals:   01/26/22 0929 01/26/22 1441  BP: (!) 158/70 (!) 167/72  Pulse: 71 75  Resp: 18 18  Temp: 98.9 F (37.2 C)   SpO2: 96% 96%     General: Awake, alert not feeling well CV:  Good peripheral perfusion.  Heart regular rate and rhythm no audible murmurs Resp:  Normal effort.  Lungs are clear Abd:  No distention.  Abdomen is soft bowel sounds are decreased there is no apparent tenderness to palpation or percussion    ED Results / Procedures / Treatments   Labs (all labs ordered are listed, but only abnormal results are displayed) Labs Reviewed  BASIC METABOLIC PANEL - Abnormal; Notable for the following components:      Result Value   CO2 20 (*)    Glucose, Bld 179 (*)    BUN 57 (*)    Creatinine, Ser 14.14 (*)    GFR, Estimated 3 (*)    All other components  within normal limits  CBC - Abnormal; Notable for the following components:   WBC 15.0 (*)    RBC 3.13 (*)    Hemoglobin 8.8 (*)    HCT 28.1 (*)    RDW 15.7 (*)    All other components within normal limits  HEPATIC FUNCTION PANEL - Abnormal; Notable for the following components:   Albumin 3.0 (*)    AST 13 (*)    All other components within normal limits  CBC WITH DIFFERENTIAL/PLATELET - Abnormal; Notable for the following components:   WBC 15.1 (*)    RBC 3.13 (*)    Hemoglobin 8.8 (*)    HCT 28.4 (*)    RDW 15.7 (*)    Neutro Abs 11.7 (*)    Monocytes Absolute 1.1 (*)    Abs Immature Granulocytes 0.30 (*)    All other components within normal limits  TROPONIN I (HIGH  SENSITIVITY) - Abnormal; Notable for the following components:   Troponin I (High Sensitivity) 30 (*)    All other components within normal limits  TROPONIN I (HIGH SENSITIVITY) - Abnormal; Notable for the following components:   Troponin I (High Sensitivity) 31 (*)    All other components within normal limits  GASTROINTESTINAL PANEL BY PCR, STOOL (REPLACES STOOL CULTURE)  C DIFFICILE QUICK SCREEN W PCR REFLEX    LIPASE, BLOOD  URINALYSIS, ROUTINE W REFLEX MICROSCOPIC     EKG  EKG read and interpreted by me shows normal sinus rhythm rate of 71 normal axis no marked acute changes there is 1 PVC.   RADIOLOGY Chest x-ray read by radiology reviewed and interpreted by me shows cardiomegaly with some increased interstitial markings possibly CHF   PROCEDURES:  Critical Care performed:   Procedures   MEDICATIONS ORDERED IN ED: Medications - No data to display   IMPRESSION / MDM / [place] / ED COURSE  I reviewed the triage vital signs and the nursing notes. Patient's troponins are stable EKG looks okay she does have high white count.  15,000 with a left shift.  Her GFR is low which she gets dialysis.  Differential diagnosis includes, but is not limited to, may have an infectious diarrhea or  inflammatory diarrhea.  She will need dialysis that she missed 2 days.  This may explain this mall amount of apparent CHF.  She is not telling me she has any chest pain or shortness of breath.  Her oxygen saturations are within normal limits.  I will get a CT of her belly and plan on having her dialyzed before she leaves if she can leave.  I have ordered stool specimens.  I am signing the patient out to the oncoming provider.  Patient's presentation is most consistent with acute presentation with potential threat to life or bodily function.  The patient is on the cardiac monitor to evaluate for evidence of arrhythmia and/or significant heart rate changes.  None have been seen so far.      FINAL CLINICAL IMPRESSION(S) / ED DIAGNOSES   Final diagnoses:  Generalized abdominal pain  Diarrhea, unspecified type     Rx / DC Orders   ED Discharge Orders     None        Note:  This document was prepared using Dragon voice recognition software and may include unintentional dictation errors.   Nena Polio, MD 01/26/22 [phone]

## 2022-03-19 ENCOUNTER — Emergency Department: Payer: Medicare Other

## 2022-03-19 ENCOUNTER — Inpatient Hospital Stay
Admission: EM | Admit: 2022-03-19 | Discharge: 2022-04-01 | DRG: 862 | Disposition: A | Discharge status: Skilled Nursing Facility | Payer: Medicare Other | Attending: Internal Medicine | Admitting: Internal Medicine

## 2022-03-19 ENCOUNTER — Other Ambulatory Visit: Payer: Self-pay

## 2022-03-19 DIAGNOSIS — Z888 Allergy status to other drugs, medicaments and biological substances status: Secondary | ICD-10-CM

## 2022-03-19 DIAGNOSIS — Z87891 Personal history of nicotine dependence: Secondary | ICD-10-CM

## 2022-03-19 DIAGNOSIS — Z8249 Family history of ischemic heart disease and other diseases of the circulatory system: Secondary | ICD-10-CM

## 2022-03-19 DIAGNOSIS — Z79899 Other long term (current) drug therapy: Secondary | ICD-10-CM

## 2022-03-19 DIAGNOSIS — Z20822 Contact with and (suspected) exposure to covid-19: Secondary | ICD-10-CM | POA: Diagnosis present

## 2022-03-19 DIAGNOSIS — Z8616 Personal history of COVID-19: Secondary | ICD-10-CM

## 2022-03-19 DIAGNOSIS — Z91158 Patient's noncompliance with renal dialysis for other reason: Secondary | ICD-10-CM

## 2022-03-19 DIAGNOSIS — E785 Hyperlipidemia, unspecified: Secondary | ICD-10-CM | POA: Diagnosis present

## 2022-03-19 DIAGNOSIS — Z803 Family history of malignant neoplasm of breast: Secondary | ICD-10-CM

## 2022-03-19 DIAGNOSIS — R6521 Severe sepsis with septic shock: Secondary | ICD-10-CM | POA: Diagnosis present

## 2022-03-19 DIAGNOSIS — I2510 Atherosclerotic heart disease of native coronary artery without angina pectoris: Secondary | ICD-10-CM | POA: Diagnosis present

## 2022-03-19 DIAGNOSIS — M96843 Postprocedural seroma of a musculoskeletal structure following other procedure: Secondary | ICD-10-CM | POA: Diagnosis present

## 2022-03-19 DIAGNOSIS — A419 Sepsis, unspecified organism: Principal | ICD-10-CM

## 2022-03-19 DIAGNOSIS — I120 Hypertensive chronic kidney disease with stage 5 chronic kidney disease or end stage renal disease: Secondary | ICD-10-CM | POA: Diagnosis present

## 2022-03-19 DIAGNOSIS — R509 Fever, unspecified: Secondary | ICD-10-CM | POA: Diagnosis not present

## 2022-03-19 DIAGNOSIS — J449 Chronic obstructive pulmonary disease, unspecified: Secondary | ICD-10-CM | POA: Diagnosis present

## 2022-03-19 DIAGNOSIS — Z885 Allergy status to narcotic agent status: Secondary | ICD-10-CM

## 2022-03-19 DIAGNOSIS — N6489 Other specified disorders of breast: Secondary | ICD-10-CM

## 2022-03-19 DIAGNOSIS — D631 Anemia in chronic kidney disease: Secondary | ICD-10-CM | POA: Diagnosis present

## 2022-03-19 DIAGNOSIS — Z823 Family history of stroke: Secondary | ICD-10-CM

## 2022-03-19 DIAGNOSIS — Z6839 Body mass index (BMI) 39.0-39.9, adult: Secondary | ICD-10-CM

## 2022-03-19 DIAGNOSIS — Z853 Personal history of malignant neoplasm of breast: Secondary | ICD-10-CM

## 2022-03-19 DIAGNOSIS — T8144XA Sepsis following a procedure, initial encounter: Principal | ICD-10-CM | POA: Diagnosis present

## 2022-03-19 DIAGNOSIS — R7881 Bacteremia: Secondary | ICD-10-CM

## 2022-03-19 DIAGNOSIS — Z7902 Long term (current) use of antithrombotics/antiplatelets: Secondary | ICD-10-CM

## 2022-03-19 DIAGNOSIS — Y828 Other medical devices associated with adverse incidents: Secondary | ICD-10-CM | POA: Diagnosis present

## 2022-03-19 DIAGNOSIS — Y838 Other surgical procedures as the cause of abnormal reaction of the patient, or of later complication, without mention of misadventure at the time of the procedure: Secondary | ICD-10-CM | POA: Diagnosis present

## 2022-03-19 DIAGNOSIS — I6523 Occlusion and stenosis of bilateral carotid arteries: Secondary | ICD-10-CM | POA: Diagnosis present

## 2022-03-19 DIAGNOSIS — J9601 Acute respiratory failure with hypoxia: Secondary | ICD-10-CM | POA: Diagnosis present

## 2022-03-19 DIAGNOSIS — E1122 Type 2 diabetes mellitus with diabetic chronic kidney disease: Secondary | ICD-10-CM | POA: Diagnosis present

## 2022-03-19 DIAGNOSIS — Z794 Long term (current) use of insulin: Secondary | ICD-10-CM

## 2022-03-19 DIAGNOSIS — E1169 Type 2 diabetes mellitus with other specified complication: Secondary | ICD-10-CM

## 2022-03-19 DIAGNOSIS — N2581 Secondary hyperparathyroidism of renal origin: Secondary | ICD-10-CM | POA: Diagnosis present

## 2022-03-19 DIAGNOSIS — B957 Other staphylococcus as the cause of diseases classified elsewhere: Secondary | ICD-10-CM

## 2022-03-19 DIAGNOSIS — K5900 Constipation, unspecified: Secondary | ICD-10-CM | POA: Diagnosis not present

## 2022-03-19 DIAGNOSIS — G9341 Metabolic encephalopathy: Secondary | ICD-10-CM | POA: Diagnosis present

## 2022-03-19 DIAGNOSIS — J9811 Atelectasis: Secondary | ICD-10-CM | POA: Diagnosis present

## 2022-03-19 DIAGNOSIS — Z992 Dependence on renal dialysis: Secondary | ICD-10-CM

## 2022-03-19 DIAGNOSIS — Z7982 Long term (current) use of aspirin: Secondary | ICD-10-CM

## 2022-03-19 DIAGNOSIS — N390 Urinary tract infection, site not specified: Secondary | ICD-10-CM | POA: Diagnosis present

## 2022-03-19 DIAGNOSIS — D638 Anemia in other chronic diseases classified elsewhere: Secondary | ICD-10-CM

## 2022-03-19 DIAGNOSIS — E669 Obesity, unspecified: Secondary | ICD-10-CM | POA: Diagnosis present

## 2022-03-19 DIAGNOSIS — Z8701 Personal history of pneumonia (recurrent): Secondary | ICD-10-CM

## 2022-03-19 DIAGNOSIS — I2699 Other pulmonary embolism without acute cor pulmonale: Secondary | ICD-10-CM | POA: Diagnosis present

## 2022-03-19 DIAGNOSIS — A411 Sepsis due to other specified staphylococcus: Secondary | ICD-10-CM | POA: Diagnosis present

## 2022-03-19 DIAGNOSIS — N611 Abscess of the breast and nipple: Secondary | ICD-10-CM | POA: Diagnosis present

## 2022-03-19 DIAGNOSIS — Z8673 Personal history of transient ischemic attack (TIA), and cerebral infarction without residual deficits: Secondary | ICD-10-CM

## 2022-03-19 DIAGNOSIS — N186 End stage renal disease: Secondary | ICD-10-CM | POA: Diagnosis present

## 2022-03-19 DIAGNOSIS — L7632 Postprocedural hematoma of skin and subcutaneous tissue following other procedure: Secondary | ICD-10-CM | POA: Diagnosis present

## 2022-03-19 DIAGNOSIS — I10 Essential (primary) hypertension: Secondary | ICD-10-CM | POA: Diagnosis present

## 2022-03-19 DIAGNOSIS — E1140 Type 2 diabetes mellitus with diabetic neuropathy, unspecified: Secondary | ICD-10-CM | POA: Diagnosis present

## 2022-03-19 LAB — CBC WITH DIFFERENTIAL/PLATELET
Abs Immature Granulocytes: 0.11 10*3/uL — ABNORMAL HIGH (ref 0.00–0.07)
Basophils Absolute: 0.1 10*3/uL (ref 0.0–0.1)
Basophils Relative: 0 %
Eosinophils Absolute: 0 10*3/uL (ref 0.0–0.5)
Eosinophils Relative: 0 %
HCT: 26.1 % — ABNORMAL LOW (ref 36.0–46.0)
Hemoglobin: 8.1 g/dL — ABNORMAL LOW (ref 12.0–15.0)
Immature Granulocytes: 1 %
Lymphocytes Relative: 11 %
Lymphs Abs: 1.8 10*3/uL (ref 0.7–4.0)
MCH: 28.1 pg (ref 26.0–34.0)
MCHC: 31 g/dL (ref 30.0–36.0)
MCV: 90.6 fL (ref 80.0–100.0)
Monocytes Absolute: 1.5 10*3/uL — ABNORMAL HIGH (ref 0.1–1.0)
Monocytes Relative: 9 %
Neutro Abs: 13 10*3/uL — ABNORMAL HIGH (ref 1.7–7.7)
Neutrophils Relative %: 79 %
Platelets: 214 10*3/uL (ref 150–400)
RBC: 2.88 MIL/uL — ABNORMAL LOW (ref 3.87–5.11)
RDW: 15.8 % — ABNORMAL HIGH (ref 11.5–15.5)
WBC: 16.5 10*3/uL — ABNORMAL HIGH (ref 4.0–10.5)
nRBC: 0 % (ref 0.0–0.2)

## 2022-03-19 LAB — COMPREHENSIVE METABOLIC PANEL
ALT: 7 U/L (ref 0–44)
AST: 15 U/L (ref 15–41)
Albumin: 3.4 g/dL — ABNORMAL LOW (ref 3.5–5.0)
Alkaline Phosphatase: 89 U/L (ref 38–126)
Anion gap: 14 (ref 5–15)
BUN: 48 mg/dL — ABNORMAL HIGH (ref 8–23)
CO2: 24 mmol/L (ref 22–32)
Calcium: 9.6 mg/dL (ref 8.9–10.3)
Chloride: 100 mmol/L (ref 98–111)
Creatinine, Ser: 11.43 mg/dL — ABNORMAL HIGH (ref 0.44–1.00)
GFR, Estimated: 3 mL/min — ABNORMAL LOW (ref >60)
Glucose, Bld: 94 mg/dL (ref 70–99)
Potassium: 4.3 mmol/L (ref 3.5–5.1)
Sodium: 138 mmol/L (ref 135–145)
Total Bilirubin: 1.2 mg/dL (ref 0.3–1.2)
Total Protein: 7.2 g/dL (ref 6.5–8.1)

## 2022-03-19 LAB — PROTIME-INR
INR: 1.2 (ref 0.8–1.2)
Prothrombin Time: 14.6 seconds (ref 11.4–15.2)

## 2022-03-19 LAB — LACTIC ACID, PLASMA: Lactic Acid, Venous: 2 mmol/L (ref 0.5–1.9)

## 2022-03-19 LAB — CBG MONITORING, ED: Glucose-Capillary: 90 mg/dL (ref 70–99)

## 2022-03-19 LAB — RESP PANEL BY RT-PCR (FLU A&B, COVID) ARPGX2
Influenza A by PCR: NEGATIVE
Influenza B by PCR: NEGATIVE
SARS Coronavirus 2 by RT PCR: NEGATIVE

## 2022-03-19 LAB — APTT: aPTT: 32 seconds (ref 24–36)

## 2022-03-19 MED ORDER — IOHEXOL 350 MG/ML SOLN
100.0000 mL | Freq: Once | INTRAVENOUS | Status: AC | PRN
  Administered 2022-03-19: 100 mL via INTRAVENOUS

## 2022-03-19 MED ORDER — SODIUM CHLORIDE 0.9 % IV SOLN
2.0000 g | Freq: Once | INTRAVENOUS | Status: AC
  Administered 2022-03-19: 2 g via INTRAVENOUS
  Filled 2022-03-19: qty 12.5

## 2022-03-19 MED ORDER — SODIUM CHLORIDE 0.9 % IV BOLUS
500.0000 mL | Freq: Once | INTRAVENOUS | Status: AC
  Administered 2022-03-19: 500 mL via INTRAVENOUS

## 2022-03-19 MED ORDER — VANCOMYCIN HCL IN DEXTROSE 1-5 GM/200ML-% IV SOLN
1000.0000 mg | Freq: Once | INTRAVENOUS | Status: AC
  Administered 2022-03-19: 1000 mg via INTRAVENOUS
  Filled 2022-03-19: qty 200

## 2022-03-19 NOTE — Progress Notes (Signed)
CODE SEPSIS - PHARMACY COMMUNICATION  **Broad Spectrum Antibiotics should be administered within 1 hour of Sepsis diagnosis**  Time Code Sepsis Called/Page Received: 2210  Antibiotics Ordered: Cefepime & Vancomycin  Time of 1st antibiotic administration: [place], PharmD, [facility] 03/19/2022 10:06 PM

## 2022-03-19 NOTE — ED Provider Notes (Signed)
[facility] Provider Note    Event Date/Time   First MD Initiated Contact with Patient 03/19/22 2108     (approximate)   History   Chief Complaint: Fatigue (Pt arrived via EMS from home for c/o increased fatigue/weakness x2 days. Pt reports missing dialysis yesterday. Pt is arousable to sound, able to answer questions appropriately. ERP at bedside. ) and Fever   HPI  Cynthia Dean is a 71 y.o. female with a history of hypertension diabetes ESRD on hemodialysis stroke obesity who comes ED due to generalized weakness for the past 2 days.  Missed dialysis yesterday due to not feeling well.  Patient is confused, unable to provide substantial portions of history.  Denies any pain.  Reports that she does still make urine.     Physical Exam   Triage Vital Signs: ED Triage Vitals [03/19/22 2123]  Enc Vitals Group     BP (!) 139/57     Pulse Rate 76     Resp (!) 23     Temp (!) 102.9 F (39.4 C)     Temp src      SpO2 (!) 87 %     Weight 254 lb (115.2 kg)     Height '5\' 9"'$  (1.753 m)     Head Circumference      Peak Flow      Pain Score      Pain Loc      Pain Edu?      Excl. in [place]?     Most recent vital signs: Vitals:   03/19/22 2230 03/19/22 2304  BP: (!) 90/44 (!) 129/42  Pulse: 67 65  Resp: (!) 22 20  Temp:    SpO2: 99% 98%    General: Awake, ill-appearing CV:  Good peripheral perfusion.  Regular rate and rhythm.  Left upper extremity AV graft in place without inflammatory changes or tenderness.  Symmetric pedal pulses. Resp:  Tachypnea.  Clear to auscultation bilaterally. Abd:  No distention.  Soft and nontender Other:  No lower extremity edema, right calf circumference increased compared to left.  Patient endorses tenderness of the right calf.   ED Results / Procedures / Treatments   Labs (all labs ordered are listed, but only abnormal results are displayed) Labs Reviewed  LACTIC ACID, PLASMA - Abnormal; Notable for the  following components:      Result Value   Lactic Acid, Venous 2.0 (*)    All other components within normal limits  COMPREHENSIVE METABOLIC PANEL - Abnormal; Notable for the following components:   BUN 48 (*)    Creatinine, Ser 11.43 (*)    Albumin 3.4 (*)    GFR, Estimated 3 (*)    All other components within normal limits  CBC WITH DIFFERENTIAL/PLATELET - Abnormal; Notable for the following components:   WBC 16.5 (*)    RBC 2.88 (*)    Hemoglobin 8.1 (*)    HCT 26.1 (*)    RDW 15.8 (*)    Neutro Abs 13.0 (*)    Monocytes Absolute 1.5 (*)    Abs Immature Granulocytes 0.11 (*)    All other components within normal limits  RESP PANEL BY RT-PCR (FLU A&B, COVID) ARPGX2  CULTURE, BLOOD (ROUTINE X 2)  CULTURE, BLOOD (ROUTINE X 2)  URINE CULTURE  PROTIME-INR  APTT  LACTIC ACID, PLASMA  URINALYSIS, COMPLETE (UACMP) WITH MICROSCOPIC  CBG MONITORING, ED     EKG Interpreted by me Sinus rhythm, rate of 81.  Right axis, normal intervals.  Normal QRS ST segments and T waves.  2 PVCs on the strip.   RADIOLOGY Chest x-ray interpreted by me, appears normal without edema or effusion or consolidation.  Radiology report reviewed.   PROCEDURES:  .Critical Care  Performed by: Carrie Mew, MD Authorized by: Carrie Mew, MD   Critical care provider statement:    Critical care time (minutes):  35   Critical care time was exclusive of:  Separately billable procedures and treating other patients   Critical care was necessary to treat or prevent imminent or life-threatening deterioration of the following conditions:  Sepsis, respiratory failure and shock   Critical care was time spent personally by me on the following activities:  Development of treatment plan with patient or surrogate, discussions with consultants, evaluation of patient's response to treatment, examination of patient, obtaining history from patient or surrogate, ordering and performing treatments and  interventions, ordering and review of laboratory studies, ordering and review of radiographic studies, pulse oximetry, re-evaluation of patient's condition and review of old charts Comments:        .1-3 Lead EKG Interpretation  Performed by: Carrie Mew, MD Authorized by: Carrie Mew, MD     Interpretation: normal     ECG rate:  75   ECG rate assessment: normal     Rhythm: sinus rhythm     Ectopy: none     Conduction: normal      MEDICATIONS ORDERED IN ED: Medications  vancomycin (VANCOCIN) IVPB 1000 mg/200 mL premix (1,000 mg Intravenous New Bag/Given 03/19/22 2230)  sodium chloride 0.9 % bolus 500 mL (has no administration in time range)  ceFEPIme (MAXIPIME) 2 g in sodium chloride 0.9 % 100 mL IVPB (0 g Intravenous Stopped 03/19/22 2322)     IMPRESSION / MDM / [place] / ED COURSE  I reviewed the triage vital signs and the nursing notes.                              Differential diagnosis includes, but is not limited to, pneumonia, pulmonary edema, pleural effusion, UTI, sepsis, electrolyte abnormality, DVT, PE, viral illness  Patient's presentation is most consistent with acute presentation with potential threat to life or bodily function.  Patient with multiple severe comorbidities presents with fever, hypoxia, tachypnea concerning for sepsis and pneumonia.  Blood cultures and empiric antibiotics started along with sepsis lab panel.  Chest x-ray, ultrasound right lower extremity.  We will need to admit for further management.  ----------------------------------------- 11:26 PM on 03/19/2022 ----------------------------------------- Patient had 2 episodes of hypotension on repeated blood pressure measurements consistent with septic shock.  She is dialysis dependent, missed her last session, at high risk for volume overload and acute pulmonary edema.  Therefore I think she would not tolerate large volume fluid bolus.  Additionally, repeated blood  pressure measurement is normalized without fluids.  We will start with 500 mL IV fluid bolus and continue monitoring respiratory function and hemodynamics..  Chest x-ray nondiagnostic, will obtain CT angiogram of the chest to evaluate for PE or occult pneumonia.       FINAL CLINICAL IMPRESSION(S) / ED DIAGNOSES   Final diagnoses:  Sepsis with acute hypoxic respiratory failure and septic shock, due to unspecified organism ([place])  ESRD on hemodialysis ([place])     Rx / DC Orders   ED Discharge Orders     None        Note:  This  document was prepared using Systems analyst and may include unintentional dictation errors.   Carrie Mew, MD 03/19/22 2328

## 2022-03-19 NOTE — Progress Notes (Signed)
PHARMACY -  BRIEF ANTIBIOTIC NOTE   Pharmacy has received consult(s) for vancomycin and cefepime from an ED provider.  The patient's profile has been reviewed for ht/wt/allergies/indication/available labs.    One time order(s) placed for vancomycin 1,000 mg x 1, and cefepime 2 grams x 1  Further antibiotics/pharmacy consults should be ordered by admitting physician if indicated.                       Thank you, Wynelle [place] 03/19/2022  10:01 PM

## 2022-03-19 NOTE — Sepsis Progress Note (Signed)
Elink following code sepsis °

## 2022-03-20 ENCOUNTER — Inpatient Hospital Stay: Payer: Medicare Other

## 2022-03-20 DIAGNOSIS — I120 Hypertensive chronic kidney disease with stage 5 chronic kidney disease or end stage renal disease: Secondary | ICD-10-CM | POA: Diagnosis present

## 2022-03-20 DIAGNOSIS — R509 Fever, unspecified: Secondary | ICD-10-CM | POA: Diagnosis present

## 2022-03-20 DIAGNOSIS — N2581 Secondary hyperparathyroidism of renal origin: Secondary | ICD-10-CM | POA: Diagnosis present

## 2022-03-20 DIAGNOSIS — I6523 Occlusion and stenosis of bilateral carotid arteries: Secondary | ICD-10-CM | POA: Diagnosis present

## 2022-03-20 DIAGNOSIS — G9341 Metabolic encephalopathy: Secondary | ICD-10-CM | POA: Diagnosis present

## 2022-03-20 DIAGNOSIS — R7881 Bacteremia: Secondary | ICD-10-CM | POA: Diagnosis not present

## 2022-03-20 DIAGNOSIS — E1140 Type 2 diabetes mellitus with diabetic neuropathy, unspecified: Secondary | ICD-10-CM | POA: Diagnosis present

## 2022-03-20 DIAGNOSIS — I10 Essential (primary) hypertension: Secondary | ICD-10-CM | POA: Diagnosis not present

## 2022-03-20 DIAGNOSIS — N390 Urinary tract infection, site not specified: Secondary | ICD-10-CM | POA: Diagnosis present

## 2022-03-20 DIAGNOSIS — N61 Mastitis without abscess: Secondary | ICD-10-CM | POA: Diagnosis not present

## 2022-03-20 DIAGNOSIS — E1122 Type 2 diabetes mellitus with diabetic chronic kidney disease: Secondary | ICD-10-CM | POA: Diagnosis present

## 2022-03-20 DIAGNOSIS — L0390 Cellulitis, unspecified: Secondary | ICD-10-CM | POA: Diagnosis not present

## 2022-03-20 DIAGNOSIS — A411 Sepsis due to other specified staphylococcus: Secondary | ICD-10-CM | POA: Diagnosis present

## 2022-03-20 DIAGNOSIS — Z8616 Personal history of COVID-19: Secondary | ICD-10-CM | POA: Diagnosis not present

## 2022-03-20 DIAGNOSIS — R6521 Severe sepsis with septic shock: Secondary | ICD-10-CM | POA: Diagnosis present

## 2022-03-20 DIAGNOSIS — Z992 Dependence on renal dialysis: Secondary | ICD-10-CM

## 2022-03-20 DIAGNOSIS — E669 Obesity, unspecified: Secondary | ICD-10-CM | POA: Diagnosis present

## 2022-03-20 DIAGNOSIS — J9811 Atelectasis: Secondary | ICD-10-CM | POA: Diagnosis present

## 2022-03-20 DIAGNOSIS — K5909 Other constipation: Secondary | ICD-10-CM | POA: Diagnosis not present

## 2022-03-20 DIAGNOSIS — Z20822 Contact with and (suspected) exposure to covid-19: Secondary | ICD-10-CM | POA: Diagnosis present

## 2022-03-20 DIAGNOSIS — Y828 Other medical devices associated with adverse incidents: Secondary | ICD-10-CM | POA: Diagnosis present

## 2022-03-20 DIAGNOSIS — E785 Hyperlipidemia, unspecified: Secondary | ICD-10-CM | POA: Diagnosis not present

## 2022-03-20 DIAGNOSIS — Z794 Long term (current) use of insulin: Secondary | ICD-10-CM | POA: Diagnosis not present

## 2022-03-20 DIAGNOSIS — Y838 Other surgical procedures as the cause of abnormal reaction of the patient, or of later complication, without mention of misadventure at the time of the procedure: Secondary | ICD-10-CM | POA: Diagnosis present

## 2022-03-20 DIAGNOSIS — N186 End stage renal disease: Secondary | ICD-10-CM

## 2022-03-20 DIAGNOSIS — B957 Other staphylococcus as the cause of diseases classified elsewhere: Secondary | ICD-10-CM | POA: Diagnosis not present

## 2022-03-20 DIAGNOSIS — D638 Anemia in other chronic diseases classified elsewhere: Secondary | ICD-10-CM | POA: Diagnosis not present

## 2022-03-20 DIAGNOSIS — M96843 Postprocedural seroma of a musculoskeletal structure following other procedure: Secondary | ICD-10-CM | POA: Diagnosis present

## 2022-03-20 DIAGNOSIS — I2699 Other pulmonary embolism without acute cor pulmonale: Secondary | ICD-10-CM | POA: Diagnosis present

## 2022-03-20 DIAGNOSIS — Z853 Personal history of malignant neoplasm of breast: Secondary | ICD-10-CM | POA: Diagnosis not present

## 2022-03-20 DIAGNOSIS — A419 Sepsis, unspecified organism: Secondary | ICD-10-CM | POA: Diagnosis not present

## 2022-03-20 DIAGNOSIS — N611 Abscess of the breast and nipple: Secondary | ICD-10-CM | POA: Diagnosis not present

## 2022-03-20 DIAGNOSIS — L7632 Postprocedural hematoma of skin and subcutaneous tissue following other procedure: Secondary | ICD-10-CM | POA: Diagnosis present

## 2022-03-20 DIAGNOSIS — J449 Chronic obstructive pulmonary disease, unspecified: Secondary | ICD-10-CM | POA: Diagnosis present

## 2022-03-20 DIAGNOSIS — J9601 Acute respiratory failure with hypoxia: Secondary | ICD-10-CM | POA: Diagnosis present

## 2022-03-20 DIAGNOSIS — T8144XA Sepsis following a procedure, initial encounter: Secondary | ICD-10-CM | POA: Diagnosis present

## 2022-03-20 DIAGNOSIS — N6489 Other specified disorders of breast: Secondary | ICD-10-CM | POA: Diagnosis not present

## 2022-03-20 DIAGNOSIS — D631 Anemia in chronic kidney disease: Secondary | ICD-10-CM | POA: Diagnosis present

## 2022-03-20 LAB — BLOOD CULTURE ID PANEL (REFLEXED) - BCID2

## 2022-03-20 LAB — HEMOGLOBIN AND HEMATOCRIT, BLOOD
HCT: 22.3 % — ABNORMAL LOW (ref 36.0–46.0)
Hemoglobin: 6.9 g/dL — ABNORMAL LOW (ref 12.0–15.0)

## 2022-03-20 LAB — BASIC METABOLIC PANEL
Anion gap: 6 (ref 5–15)
BUN: 47 mg/dL — ABNORMAL HIGH (ref 8–23)
CO2: 21 mmol/L — ABNORMAL LOW (ref 22–32)
Calcium: 7.9 mg/dL — ABNORMAL LOW (ref 8.9–10.3)
Chloride: 109 mmol/L (ref 98–111)
Creatinine, Ser: 9.97 mg/dL — ABNORMAL HIGH (ref 0.44–1.00)
GFR, Estimated: 4 mL/min — ABNORMAL LOW (ref >60)
Glucose, Bld: 105 mg/dL — ABNORMAL HIGH (ref 70–99)
Potassium: 3.8 mmol/L (ref 3.5–5.1)
Sodium: 136 mmol/L (ref 135–145)

## 2022-03-20 LAB — CBC
HCT: 21.8 % — ABNORMAL LOW (ref 36.0–46.0)
Hemoglobin: 6.6 g/dL — ABNORMAL LOW (ref 12.0–15.0)
MCH: 28.2 pg (ref 26.0–34.0)
MCHC: 30.3 g/dL (ref 30.0–36.0)
MCV: 93.2 fL (ref 80.0–100.0)
Platelets: 163 10*3/uL (ref 150–400)
RBC: 2.34 MIL/uL — ABNORMAL LOW (ref 3.87–5.11)
RDW: 16 % — ABNORMAL HIGH (ref 11.5–15.5)
WBC: 14.4 10*3/uL — ABNORMAL HIGH (ref 4.0–10.5)
nRBC: 0 % (ref 0.0–0.2)

## 2022-03-20 LAB — URINALYSIS, COMPLETE (UACMP) WITH MICROSCOPIC
Bilirubin Urine: NEGATIVE
Glucose, UA: 50 mg/dL — AB
Ketones, ur: NEGATIVE mg/dL
Leukocytes,Ua: NEGATIVE
Nitrite: NEGATIVE
Protein, ur: 100 mg/dL — AB
Specific Gravity, Urine: 1.01 (ref 1.005–1.030)
Squamous Epithelial / HPF: NONE SEEN (ref 0–5)
pH: 7 (ref 5.0–8.0)

## 2022-03-20 LAB — HEPARIN LEVEL (UNFRACTIONATED)
Heparin Unfractionated: >1.1 IU/mL — ABNORMAL HIGH (ref 0.30–0.70)
Heparin Unfractionated: >1.1 IU/mL — ABNORMAL HIGH (ref 0.30–0.70)

## 2022-03-20 LAB — CBG MONITORING, ED
Glucose-Capillary: 131 mg/dL — ABNORMAL HIGH (ref 70–99)
Glucose-Capillary: 117 mg/dL — ABNORMAL HIGH (ref 70–99)

## 2022-03-20 LAB — PROCALCITONIN: Procalcitonin: 3.92 ng/mL

## 2022-03-20 LAB — LACTIC ACID, PLASMA: Lactic Acid, Venous: 1.1 mmol/L (ref 0.5–1.9)

## 2022-03-20 LAB — PREPARE RBC (CROSSMATCH)

## 2022-03-20 MED ORDER — HEPARIN SODIUM (PORCINE) 5000 UNIT/ML IJ SOLN
5000.0000 [IU] | Freq: Once | INTRAMUSCULAR | Status: AC
  Administered 2022-03-20: 5000 [IU] via INTRAVENOUS
  Filled 2022-03-20: qty 1

## 2022-03-20 MED ORDER — INSULIN ASPART 100 UNIT/ML IJ SOLN
0.0000 [IU] | Freq: Three times a day (TID) | INTRAMUSCULAR | Status: DC
  Administered 2022-03-21 – 2022-04-01 (×8): 1 [IU] via SUBCUTANEOUS
  Filled 2022-03-20 (×8): qty 1

## 2022-03-20 MED ORDER — METOCLOPRAMIDE HCL 5 MG/ML IJ SOLN
5.0000 mg | Freq: Three times a day (TID) | INTRAMUSCULAR | Status: DC | PRN
  Administered 2022-03-25: 5 mg via INTRAVENOUS
  Filled 2022-03-20: qty 2

## 2022-03-20 MED ORDER — OXYCODONE HCL 5 MG PO TABS
5.0000 mg | ORAL_TABLET | Freq: Four times a day (QID) | ORAL | Status: DC | PRN
  Administered 2022-03-25 – 2022-03-26 (×2): 5 mg via ORAL
  Filled 2022-03-20 (×3): qty 1

## 2022-03-20 MED ORDER — SODIUM CHLORIDE 0.9 % IV SOLN
2.0000 g | INTRAVENOUS | Status: DC
  Administered 2022-03-20 – 2022-03-23 (×4): 2 g via INTRAVENOUS
  Filled 2022-03-20 (×4): qty 20

## 2022-03-20 MED ORDER — MAGNESIUM HYDROXIDE 400 MG/5ML PO SUSP
30.0000 mL | Freq: Every day | ORAL | Status: DC | PRN
  Administered 2022-03-23: 30 mL via ORAL
  Filled 2022-03-20: qty 30

## 2022-03-20 MED ORDER — LEVETIRACETAM 500 MG PO TABS
1000.0000 mg | ORAL_TABLET | Freq: Every day | ORAL | Status: DC
  Administered 2022-03-20 – 2022-04-01 (×13): 1000 mg via ORAL
  Filled 2022-03-20 (×16): qty 2

## 2022-03-20 MED ORDER — ACETAMINOPHEN 650 MG RE SUPP: 650.0000 mg | Freq: Four times a day (QID) | RECTAL | Status: DC | PRN

## 2022-03-20 MED ORDER — HEPARIN (PORCINE) 25000 UT/250ML-% IV SOLN
1500.0000 [IU]/h | INTRAVENOUS | Status: DC
  Administered 2022-03-20: 1500 [IU]/h via INTRAVENOUS
  Filled 2022-03-20: qty 250

## 2022-03-20 MED ORDER — IPRATROPIUM-ALBUTEROL 20-100 MCG/ACT IN AERS: 1.0000 | INHALATION_SPRAY | Freq: Two times a day (BID) | RESPIRATORY_TRACT | Status: DC

## 2022-03-20 MED ORDER — INSULIN ASPART PROT & ASPART (70-30 MIX) 100 UNIT/ML ~~LOC~~ SUSP
12.0000 [IU] | Freq: Two times a day (BID) | SUBCUTANEOUS | Status: DC
  Administered 2022-03-20 (×2): 12 [IU] via SUBCUTANEOUS
  Filled 2022-03-20: qty 10

## 2022-03-20 MED ORDER — IPRATROPIUM-ALBUTEROL 0.5-2.5 (3) MG/3ML IN SOLN
3.0000 mL | Freq: Two times a day (BID) | RESPIRATORY_TRACT | Status: DC
  Administered 2022-03-20 – 2022-03-23 (×7): 3 mL via RESPIRATORY_TRACT
  Filled 2022-03-20 (×7): qty 3

## 2022-03-20 MED ORDER — GLUCOSE 4 G PO CHEW: 1.0000 | CHEWABLE_TABLET | ORAL | Status: DC | PRN

## 2022-03-20 MED ORDER — LOSARTAN POTASSIUM 50 MG PO TABS
100.0000 mg | ORAL_TABLET | Freq: Every day | ORAL | Status: DC
  Administered 2022-03-20 – 2022-03-27 (×8): 100 mg via ORAL
  Filled 2022-03-20 (×9): qty 2

## 2022-03-20 MED ORDER — CINACALCET HCL 30 MG PO TABS
30.0000 mg | ORAL_TABLET | Freq: Every day | ORAL | Status: DC
  Administered 2022-03-20 – 2022-03-31 (×11): 30 mg via ORAL
  Filled 2022-03-20 (×14): qty 1

## 2022-03-20 MED ORDER — SODIUM CHLORIDE 0.9 % IV SOLN: INTRAVENOUS | Status: DC

## 2022-03-20 MED ORDER — SODIUM CHLORIDE 0.9% IV SOLUTION
Freq: Once | INTRAVENOUS | Status: AC
  Filled 2022-03-20: qty 250

## 2022-03-20 MED ORDER — HEPARIN (PORCINE) 25000 UT/250ML-% IV SOLN
1350.0000 [IU]/h | INTRAVENOUS | Status: DC
  Administered 2022-03-20 – 2022-03-21 (×2): 1000 [IU]/h via INTRAVENOUS
  Administered 2022-03-22: 1350 [IU]/h via INTRAVENOUS
  Filled 2022-03-20 (×2): qty 250

## 2022-03-20 MED ORDER — GABAPENTIN 100 MG PO CAPS
100.0000 mg | ORAL_CAPSULE | Freq: Two times a day (BID) | ORAL | Status: DC
  Administered 2022-03-20 – 2022-04-01 (×24): 100 mg via ORAL
  Filled 2022-03-20 (×25): qty 1

## 2022-03-20 MED ORDER — LIDOCAINE-PRILOCAINE 2.5-2.5 % EX CREA: 1.0000 | TOPICAL_CREAM | CUTANEOUS | Status: DC | PRN

## 2022-03-20 MED ORDER — CALCIUM ACETATE (PHOS BINDER) 667 MG PO CAPS
2001.0000 mg | ORAL_CAPSULE | Freq: Three times a day (TID) | ORAL | Status: DC
Start: 2022-03-20 — End: 2022-03-29
  Administered 2022-03-20 – 2022-03-29 (×23): 2001 mg via ORAL
  Filled 2022-03-20 (×27): qty 3

## 2022-03-20 MED ORDER — PANTOPRAZOLE SODIUM 40 MG PO TBEC
40.0000 mg | DELAYED_RELEASE_TABLET | Freq: Every day | ORAL | Status: DC
  Administered 2022-03-20 – 2022-04-01 (×13): 40 mg via ORAL
  Filled 2022-03-20 (×13): qty 1

## 2022-03-20 MED ORDER — SODIUM CHLORIDE 0.9 % IV SOLN
100.0000 mg | Freq: Two times a day (BID) | INTRAVENOUS | Status: DC
  Administered 2022-03-20 – 2022-03-23 (×6): 100 mg via INTRAVENOUS
  Filled 2022-03-20 (×8): qty 100

## 2022-03-20 MED ORDER — ASPIRIN 81 MG PO TBEC
81.0000 mg | DELAYED_RELEASE_TABLET | Freq: Every day | ORAL | Status: DC
  Administered 2022-03-20: 81 mg via ORAL
  Filled 2022-03-20: qty 1

## 2022-03-20 MED ORDER — DIPHENOXYLATE-ATROPINE 2.5-0.025 MG PO TABS
1.0000 | ORAL_TABLET | Freq: Four times a day (QID) | ORAL | Status: DC | PRN
Start: 2022-03-20 — End: 2022-04-02
  Filled 2022-03-20: qty 1

## 2022-03-20 MED ORDER — ATORVASTATIN CALCIUM 80 MG PO TABS
80.0000 mg | ORAL_TABLET | Freq: Every evening | ORAL | Status: DC
  Administered 2022-03-21 – 2022-04-01 (×11): 80 mg via ORAL
  Filled 2022-03-20 (×12): qty 1

## 2022-03-20 MED ORDER — CLOPIDOGREL BISULFATE 75 MG PO TABS
75.0000 mg | ORAL_TABLET | Freq: Every day | ORAL | Status: DC
  Administered 2022-03-20: 75 mg via ORAL
  Filled 2022-03-20: qty 1

## 2022-03-20 MED ORDER — TORSEMIDE 20 MG PO TABS
100.0000 mg | ORAL_TABLET | Freq: Every day | ORAL | Status: DC
  Administered 2022-03-20 – 2022-03-28 (×9): 100 mg via ORAL
  Filled 2022-03-20 (×10): qty 5

## 2022-03-20 MED ORDER — SODIUM CHLORIDE 0.9 % IV SOLN: 2.0000 g | INTRAVENOUS | Status: DC

## 2022-03-20 MED ORDER — METOPROLOL SUCCINATE ER 50 MG PO TB24
100.0000 mg | ORAL_TABLET | Freq: Every day | ORAL | Status: DC
  Administered 2022-03-20 – 2022-03-28 (×8): 100 mg via ORAL
  Filled 2022-03-20 (×3): qty 1
  Filled 2022-03-20 (×2): qty 2
  Filled 2022-03-20 (×3): qty 1

## 2022-03-20 MED ORDER — INSULIN ASPART PROT & ASPART (70-30 MIX) 100 UNIT/ML ~~LOC~~ SUSP
28.0000 [IU] | Freq: Two times a day (BID) | SUBCUTANEOUS | Status: DC
  Filled 2022-03-20: qty 10

## 2022-03-20 MED ORDER — TRAZODONE HCL 50 MG PO TABS
25.0000 mg | ORAL_TABLET | Freq: Every evening | ORAL | Status: DC | PRN
  Administered 2022-03-21 – 2022-03-31 (×5): 25 mg via ORAL
  Filled 2022-03-20 (×5): qty 1

## 2022-03-20 MED ORDER — LOPERAMIDE HCL 2 MG PO CAPS
2.0000 mg | ORAL_CAPSULE | Freq: Four times a day (QID) | ORAL | Status: DC | PRN
Start: 2022-03-20 — End: 2022-03-21

## 2022-03-20 MED ORDER — HEPARIN (PORCINE) 25000 UT/250ML-% IV SOLN
1300.0000 [IU]/h | INTRAVENOUS | Status: DC
  Administered 2022-03-20: 1300 [IU]/h via INTRAVENOUS

## 2022-03-20 MED ORDER — VITAMIN D 25 MCG (1000 UNIT) PO TABS
1000.0000 [IU] | ORAL_TABLET | Freq: Every day | ORAL | Status: DC
  Administered 2022-03-20 – 2022-04-01 (×13): 1000 [IU] via ORAL
  Filled 2022-03-20 (×13): qty 1

## 2022-03-20 MED ORDER — FLUTICASONE PROPIONATE 50 MCG/ACT NA SUSP
1.0000 | Freq: Every day | NASAL | Status: DC
  Administered 2022-03-20 – 2022-04-01 (×11): 1 via NASAL
  Filled 2022-03-20 (×2): qty 16

## 2022-03-20 MED ORDER — LEVETIRACETAM 250 MG PO TABS
250.0000 mg | ORAL_TABLET | ORAL | Status: DC
  Administered 2022-03-21 – 2022-04-01 (×5): 250 mg via ORAL
  Filled 2022-03-20 (×7): qty 1

## 2022-03-20 MED ORDER — ACETAMINOPHEN 325 MG PO TABS
650.0000 mg | ORAL_TABLET | Freq: Four times a day (QID) | ORAL | Status: DC | PRN
  Administered 2022-03-23 – 2022-03-31 (×2): 650 mg via ORAL
  Filled 2022-03-20 (×2): qty 2

## 2022-03-20 NOTE — Assessment & Plan Note (Signed)
-   We will continue Neurontin. ?

## 2022-03-20 NOTE — Progress Notes (Signed)
ANTICOAGULATION CONSULT NOTE  Pharmacy Consult for heparin infusion Indication: pulmonary embolus  Allergies  Allergen Reactions   Oxycodone Nausea And Vomiting   Oxycodone-Acetaminophen Other (See Comments)   Wound Dressing Adhesive    Hydrocodone     Intolerant more than allergic   Tape Itching    Skin Dermatitis/itching (tape adhesive) Skin Dermatitis/itching (tape adhesive)   Tapentadol Itching    Skin Dermatitis/itching (tape adhesive) Skin Dermatitis/itching (tape adhesive)     Patient Measurements: Height: '5\' 9"'$  (175.3 cm) Weight: 115.2 kg (254 lb) IBW/kg (Calculated) : 66.2 Heparin Dosing Weight: 92.5 kg  Vital Signs: Temp: 98.7 F (37.1 C) (08/27 0030) Temp Source: Oral (08/27 0030) BP: 117/50 (08/27 0000) Pulse Rate: 61 (08/27 0000)  Labs: Recent Labs    03/19/22 2127  HGB 8.1*  HCT 26.1*  PLT 214  APTT 32  LABPROT 14.6  INR 1.2  CREATININE 11.43*    Estimated Creatinine Clearance: 6.1 mL/min (A) (by C-G formula based on SCr of 11.43 mg/dL (H)).   Medical History: Past Medical History:  Diagnosis Date   Acute on chronic respiratory failure with hypoxia ([place]) 07/30/2019   Acute respiratory failure with hypoxia ([place]) 12/31/2018   Carotid arterial disease ([place])    a. 02/2018 < bilat ICA stenosis.   COPD (chronic obstructive pulmonary disease) ([facility])    Coronary artery disease    a. 11/2015 MV: EF 57%, no ischemia/infarct.   Cryptogenic stroke ([place])    a. 10/2017 s/p implantable loop recorder (no Afib to date).   Diabetes mellitus without complication ([place])    Diarrhea    ESRD on dialysis [facility])    GI bleed    a. 01/2019 EGD/Colonoscopy: Mild gastritis. Colitis.   Heart murmur    a. 12/2018 Echo: EF 55-60%, impaired relaxation. Mod dil LA. Mildly dil RA. Mild Ca2+ of AoV.   Hematemesis 06/27/2017   History of 2019 novel coronavirus disease (COVID-19) 07/30/2019   Hyperkalemia    Hyperlipidemia    Hypertension    Intractable nausea and vomiting  08/08/2015   Nausea & vomiting 05/30/2018   Nausea and vomiting 05/29/2018   Pneumonia due to COVID-19 virus     Medications:  Pt given initial dose of heparin 5000 units 8/27 @ 0045  Assessment: Pt is a 71 yo female w/ ESRD on HD presenting to ED c/o increase weakness & fatigue, found w/ small pulmonary emboli within both lower lobes and the right upper lobe.  Goal of Therapy:  Heparin level 0.3-0.7 units/ml Monitor platelets by anticoagulation protocol: Yes   Plan:  Initial bolus already administered Start heparin infusion at 1500 units/hr Will check HL in 8 hr after start of infusion HL & CBC daily while on heparin  Renda Rolls, PharmD, [facility] 03/20/2022 12:55 AM

## 2022-03-20 NOTE — ED Notes (Signed)
15 min. No issues with pt with blood transfusion

## 2022-03-20 NOTE — Assessment & Plan Note (Addendum)
-  Nephrology follow-up consultation will be obtained. - The patient missed hemodialysis on Friday.

## 2022-03-20 NOTE — Assessment & Plan Note (Signed)
-   The patient will be placed on supplemental coverage with NovoLog and will continue her basal coverage.

## 2022-03-20 NOTE — Progress Notes (Signed)
ANTICOAGULATION CONSULT NOTE  Pharmacy Consult for heparin infusion Indication: pulmonary embolus  Allergies  Allergen Reactions   Oxycodone Nausea And Vomiting   Oxycodone-Acetaminophen Other (See Comments)   Wound Dressing Adhesive    Hydrocodone     Intolerant more than allergic   Tape Itching    Skin Dermatitis/itching (tape adhesive) Skin Dermatitis/itching (tape adhesive)   Tapentadol Itching    Skin Dermatitis/itching (tape adhesive) Skin Dermatitis/itching (tape adhesive)     Patient Measurements: Height: '5\' 9"'$  (175.3 cm) Weight: 115.2 kg (254 lb) IBW/kg (Calculated) : 66.2 Heparin Dosing Weight: 92.5 kg  Vital Signs: Temp: 97.5 F (36.4 C) (08/27 1130) Temp Source: Oral (08/27 1130) BP: 152/75 (08/27 1600) Pulse Rate: 65 (08/27 1600)  Labs: Recent Labs    03/19/22 2127 03/20/22 0448 03/20/22 0915 03/20/22 1901  HGB 8.1* 6.6*  --  6.9*  HCT 26.1* 21.8*  --  22.3*  PLT 214 163  --   --   APTT 32  --   --   --   LABPROT 14.6  --   --   --   INR 1.2  --   --   --   HEPARINUNFRC  --   --  >1.10* >1.10*  CREATININE 11.43* 9.97*  --   --      Estimated Creatinine Clearance: 7 mL/min (A) (by C-G formula based on SCr of 9.97 mg/dL (H)).   Medical History: Past Medical History:  Diagnosis Date   Acute on chronic respiratory failure with hypoxia ([place]) 07/30/2019   Acute respiratory failure with hypoxia ([place]) 12/31/2018   Carotid arterial disease ([place])    a. 02/2018 < bilat ICA stenosis.   COPD (chronic obstructive pulmonary disease) (H[facility])    Coronary artery disease    a. 11/2015 MV: EF 57%, no ischemia/infarct.   Cryptogenic stroke ([place])    a. 10/2017 s/p implantable loop recorder (no Afib to date).   Diabetes mellitus without complication ([place])    Diarrhea    ESRD on dialysis [facility])    GI bleed    a. 01/2019 EGD/Colonoscopy: Mild gastritis. Colitis.   Heart murmur    a. 12/2018 Echo: EF 55-60%, impaired relaxation. Mod dil LA. Mildly dil RA. Mild Ca2+ of  AoV.   Hematemesis 06/27/2017   History of 2019 novel coronavirus disease (COVID-19) 07/30/2019   Hyperkalemia    Hyperlipidemia    Hypertension    Intractable nausea and vomiting 08/08/2015   Nausea & vomiting 05/30/2018   Nausea and vomiting 05/29/2018   Pneumonia due to COVID-19 virus     Medications:  Pt given initial dose of heparin 5000 units 8/27 @ 0045  Assessment: Pt is a 71 yo female w/ ESRD on HD presenting to ED c/o increase weakness & fatigue, found w/ small pulmonary emboli within both lower lobes and the right upper lobe.  8/27 0915 HL > 1.1 8/27 1901 HL > 1.1  Goal of Therapy:  Heparin level 0.3-0.7 units/ml Monitor platelets by anticoagulation protocol: Yes   Plan: HL is supratherapeutic x 1. Will hold heparin x 1 hour starting at [place].  Heparin level is supratherapeutic. Will hold heparin for 1 hours and decrease the heparin infusion to 1000 units/hr. Recheck heparin heparin level in 8 hours. CBC daily while on heparin.   Glean Salvo, PharmD Clinical Pharmacist  03/20/2022 7:49 PM

## 2022-03-20 NOTE — Assessment & Plan Note (Signed)
-   We will continue Norvasc and Cozaar as well as Toprol-XL.

## 2022-03-20 NOTE — H&P (Signed)
[place]   PATIENT NAME: Cynthia Dean    MR#:  [id number]  DATE OF BIRTH:  08/06/1950  DATE OF ADMISSION:  03/19/2022  PRIMARY CARE PHYSICIAN: Lowella Bandy, MD   Patient is coming from: Home  REQUESTING/REFERRING PHYSICIAN: Vladimir Crofts, MD  CHIEF COMPLAINT:   Chief Complaint  Patient presents with   Fatigue    Pt arrived via EMS from home for c/o increased fatigue/weakness x2 days. Pt reports missing dialysis yesterday. Pt is arousable to sound, able to answer questions appropriately. ERP at bedside.    Fever    HISTORY OF PRESENT ILLNESS:  Cynthia Dean is a 71 y.o. African-American female with medical history significant for COPD, carotid stenosis, type 2 diabetes mellitus, end-stage renal disease on hemodialysis on MWF, hypertension and dyslipidemia, as well as invasive ductal carcinoma of the left breast status post surgical excision at [facility] recently, who presented to the emergency room with acute onset of generalized weakness and fatigue for the last couple of days.  She missed hemodialysis session yesterday.  She was fairly confused in the ER.  She has been having dyspnea with mild cough and occasional wheezing.  She admitted to fever and chills.  No chest pain or palpitations.  No nausea or vomiting or diarrhea or abdominal pain.  No bleeding diathesis.  ED Course: Upon presentation to the emergency room temperature was 102.9 with BP 139/57 heart rate 50 with respiratory to 23  with pulse oximetry of 87% on room air and 98% on 4 L of O2 by nasal cannula.  Labs revealed leukocytosis 16.5 with neutrophilia and hemoglobin of 8.1 with hematocrit 26.1 with platelets of 214.  Influenza antigens and COVID-19 PCR came back negative.  Blood cultures were drawn. EKG as reviewed by me : EKG showed sinus rhythm with rate of 81 with T wave inversion laterally. Imaging: Portable chest x-ray showed increasing vascular congestion without pulmonary edema.  Chest CTA  revealed small pulmonary emboli within both lower lobes and the right upper lobe with no evidence for right heart strain.  It showed cardiomegaly and coronary artery disease with bibasal atelectasis and large masslike area in the left breast with surrounding soft tissue gas that corresponds with recent left lumpectomy.  The patient was given IV cefepime and heparin bolus and drip as well as 500 mL IV normal saline.  She will be admitted to a progressive unit bed for further evaluation and management. PAST MEDICAL HISTORY:   Past Medical History:  Diagnosis Date   Acute on chronic respiratory failure with hypoxia ([place]) 07/30/2019   Acute respiratory failure with hypoxia ([place]) 12/31/2018   Carotid arterial disease ([place])    a. 02/2018 < bilat ICA stenosis.   COPD (chronic obstructive pulmonary disease) (HCC)    Coronary artery disease    a. 11/2015 MV: EF 57%, no ischemia/infarct.   Cryptogenic stroke ([place])    a. 10/2017 s/p implantable loop recorder (no Afib to date).   Diabetes mellitus without complication ([place])    Diarrhea    ESRD on dialysis [facility])    GI bleed    a. 01/2019 EGD/Colonoscopy: Mild gastritis. Colitis.   Heart murmur    a. 12/2018 Echo: EF 55-60%, impaired relaxation. Mod dil LA. Mildly dil RA. Mild Ca2+ of AoV.   Hematemesis 06/27/2017   History of 2019 novel coronavirus disease (COVID-19) 07/30/2019   Hyperkalemia    Hyperlipidemia    Hypertension    Intractable nausea and vomiting 08/08/2015  Nausea & vomiting 05/30/2018   Nausea and vomiting 05/29/2018   Pneumonia due to COVID-19 virus     PAST SURGICAL HISTORY:   Past Surgical History:  Procedure Laterality Date   A/V FISTULAGRAM Left 12/20/2016   Procedure: A/V Fistulagram;  Surgeon: Katha Cabal, MD;  Location: [place] [facility];  Service: Cardiovascular;  Laterality: Left;   A/V SHUNT INTERVENTION N/A 12/20/2016   Procedure: A/V Shunt Intervention;  Surgeon: Katha Cabal, MD;  Location: [place]  CV LAB;  Service: Cardiovascular;  Laterality: N/A;   A/V SHUNTOGRAM Left 09/11/2017   Procedure: A/V SHUNTOGRAM;  Surgeon: Algernon Huxley, MD;  Location: [place] [facility];  Service: Cardiovascular;  Laterality: Left;   A/V SHUNTOGRAM Left 11/21/2019   Procedure: A/V SHUNTOGRAM;  Surgeon: Algernon Huxley, MD;  Location: [place] CV LAB;  Service: Cardiovascular;  Laterality: Left;   BREAST BIOPSY Bilateral 07/19/2000   neg   BREAST BIOPSY Left 11/03/2021   [place] bx mass at 3:00, venus marker, axilla bx-hyrdo marker, path pending   COLONOSCOPY WITH PROPOFOL N/A 02/16/2019   Procedure: COLONOSCOPY WITH PROPOFOL;  Surgeon: Toledo, Benay Pike, MD;  Location: [facility];  Service: Gastroenterology;  Laterality: N/A;   ESOPHAGOGASTRODUODENOSCOPY (EGD) WITH PROPOFOL N/A 02/16/2019   Procedure: ESOPHAGOGASTRODUODENOSCOPY (EGD) WITH PROPOFOL;  Surgeon: Toledo, Benay Pike, MD;  Location: [facility] END[facility];  Service: Gastroenterology;  Laterality: N/A;   LOOP RECORDER INSERTION N/A 11/16/2017   Procedure: LOOP RECORDER INSERTION;  Surgeon: Deboraha Sprang, MD;  Location: [place] CV LAB;  Service: Cardiovascular;  Laterality: N/A;   PERIPHERAL VASCULAR CATHETERIZATION Left 02/02/2015   Procedure: A/V Shuntogram/Fistulagram;  Surgeon: Algernon Huxley, MD;  Location: [place] CV LAB;  Service: Cardiovascular;  Laterality: Left;   PERIPHERAL VASCULAR CATHETERIZATION Left 02/02/2015   Procedure: A/V Shunt Intervention;  Surgeon: Algernon Huxley, MD;  Location: [place] CV LAB;  Service: Cardiovascular;  Laterality: Left;   PERIPHERAL VASCULAR CATHETERIZATION Left 03/09/2015   Procedure: A/V Shuntogram/Fistulagram;  Surgeon: Algernon Huxley, MD;  Location: [place] CV LAB;  Service: Cardiovascular;  Laterality: Left;   PERIPHERAL VASCULAR CATHETERIZATION N/A 03/09/2015   Procedure: A/V Shunt Intervention;  Surgeon: Algernon Huxley, MD;  Location: [place] CV [facility];  Service: Cardiovascular;  Laterality:  N/A;   PERIPHERAL VASCULAR CATHETERIZATION Left 04/17/2015   Procedure: Upper Extremity Angiography;  Surgeon: Algernon Huxley, MD;  Location: [place] [facility];  Service: Cardiovascular;  Laterality: Left;   PERIPHERAL VASCULAR CATHETERIZATION  04/17/2015   Procedure: Upper Extremity Intervention;  Surgeon: Algernon Huxley, MD;  Location: [place] [facility];  Service: Cardiovascular;;   PERIPHERAL VASCULAR CATHETERIZATION N/A 08/10/2015   Procedure: A/V Shuntogram/Fistulagram;  Surgeon: Algernon Huxley, MD;  Location: [place] CV LAB;  Service: Cardiovascular;  Laterality: N/A;   PERIPHERAL VASCULAR CATHETERIZATION N/A 08/10/2015   Procedure: A/V Shunt Intervention;  Surgeon: Algernon Huxley, MD;  Location: [place] [facility];  Service: Cardiovascular;  Laterality: N/A;   PERIPHERAL VASCULAR CATHETERIZATION Left 04/11/2016   Procedure: A/V Shuntogram/Fistulagram;  Surgeon: Algernon Huxley, MD;  Location: [place] [facility] LAB;  Service: Cardiovascular;  Laterality: Left;   TEE WITHOUT CARDIOVERSION N/A 11/16/2017   Procedure: TRANSESOPHAGEAL ECHOCARDIOGRAM (TEE);  Surgeon: Minna Merritts, MD;  Location: [facility];  Service: Cardiovascular;  Laterality: N/A;    SOCIAL HISTORY:   Social History   Tobacco Use   Smoking status: Former    Packs/day: 0.00  Types: Cigarettes   Smokeless tobacco: Never  Substance Use Topics   Alcohol use: No    FAMILY HISTORY:   Family History  Problem Relation Age of Onset   Breast cancer Father 14   Stroke Mother    Heart attack Mother    Heart Problems Sister    Breast cancer Cousin 22       1 st cousin. Maternal     DRUG ALLERGIES:   Allergies  Allergen Reactions   Oxycodone Nausea And Vomiting   Oxycodone-Acetaminophen Other (See Comments)   Wound Dressing Adhesive    Hydrocodone     Intolerant more than allergic   Tape Itching    Skin Dermatitis/itching (tape adhesive) Skin Dermatitis/itching (tape adhesive)   Tapentadol Itching    Skin  Dermatitis/itching (tape adhesive) Skin Dermatitis/itching (tape adhesive)     REVIEW OF SYSTEMS:   ROS As per history of present illness. All pertinent systems were reviewed above. Constitutional, HEENT, cardiovascular, respiratory, GI, GU, musculoskeletal, neuro, psychiatric, endocrine, integumentary and hematologic systems were reviewed and are otherwise negative/unremarkable except for positive findings mentioned above in the HPI.   MEDICATIONS AT HOME:   Prior to Admission medications   Medication Sig Start Date End Date Taking? Authorizing Provider  acetaminophen (TYLENOL) 500 MG tablet Take 500 mg by mouth every 6 (six) hours as needed.   Yes [provider]  aspirin EC 81 MG tablet Take 81 mg by mouth daily. Swallow whole.   Yes [provider]  atorvastatin (LIPITOR) 80 MG tablet Take 80 mg by mouth every evening.    Yes [provider]  calcium acetate (PHOSLO) 667 MG capsule Take 3 capsules (2,001 mg total) by mouth 3 (three) times daily with meals. 08/11/19  Yes Nicole Kindred A, DO  cholecalciferol (VITAMIN D) 25 MCG tablet Take 1 tablet (1,000 Units total) by mouth daily. 08/11/19  Yes Ezekiel Slocumb, DO  cinacalcet (SENSIPAR) 30 MG tablet Take 30 mg by mouth daily at 6 (six) AM. Take with largest meal 11/10/19  Yes [provider]  clopidogrel (PLAVIX) 75 MG tablet TAKE ONE TABLET BY MOUTH ONCE DAILY Patient taking differently: Take 75 mg by mouth daily. 06/14/16  Yes Stegmayer, Joelene Millin A, PA-C  fluticasone (FLONASE) 50 MCG/ACT nasal spray Place 1 spray into both nostrils daily.   Yes [provider]  gabapentin (NEURONTIN) 100 MG capsule Take 1 capsule (100 mg total) by mouth at bedtime. Patient taking differently: Take 100 mg by mouth 2 (two) times daily. 10/25/19  Yes Patrecia Pour, MD  glucose 4 GM chewable tablet Chew 1 tablet by mouth as needed for low blood sugar.   Yes [provider]  Ipratropium-Albuterol  (COMBIVENT) 20-100 MCG/ACT AERS respimat Inhale 1 puff into the lungs every 6 (six) hours. Patient taking differently: Inhale 1 puff into the lungs 2 (two) times daily. 08/11/19  Yes Nicole Kindred A, DO  levETIRAcetam (KEPPRA) 1000 MG tablet Take 1 tablet (1,000 mg total) by mouth daily. 03/15/18  Yes Vaughan Basta, MD  levETIRAcetam (KEPPRA) 250 MG tablet Take 250 mg by mouth 3 (three) times a week. 01/16/20  Yes [provider]  lidocaine-prilocaine (EMLA) cream Apply 1 application topically as needed (for port access).    Yes [provider]  losartan (COZAAR) 100 MG tablet Take 100 mg by mouth at bedtime. 02/07/22  Yes [provider]  metoprolol succinate (TOPROL-XL) 100 MG 24 hr tablet Take 100 mg by mouth daily. Take with or  immediately following a meal.    Yes [provider]  NOVOLIN 70/30 (70-30) 100 UNIT/ML injection SMARTSIG:28 Unit(s) SUB-Q Twice Daily 10/12/21  Yes [provider]  omeprazole (PRILOSEC) 20 MG capsule Take 20 mg by mouth 2 (two) times daily.    Yes [provider]  torsemide (DEMADEX) 100 MG tablet Take 100 mg by mouth daily. 10/18/21  Yes [provider]  diphenoxylate-atropine (LOMOTIL) 2.5-0.025 MG tablet SMARTSIG:1 Tablet(s) By Mouth Every 12 Hours PRN 01/06/22   [provider]  loperamide (IMODIUM) 2 MG capsule Take 2 mg by mouth 4 (four) times daily as needed. 03/07/22   [provider]  oxyCODONE (OXY IR/ROXICODONE) 5 MG immediate release tablet Take 5 mg by mouth every 6 (six) hours as needed. 03/04/22   [provider]      VITAL SIGNS:  Blood pressure (!) 122/48, pulse 60, temperature 98.7 F (37.1 C), temperature source Oral, resp. rate 16, height '5\' 9"'$  (1.753 m), weight 115.2 kg, SpO2 100 %.  PHYSICAL EXAMINATION:  Physical Exam  GENERAL:  71 y.o.-year-old African-American female patient lying in the bed with no acute distress.  EYES: Pupils equal, round,  reactive to light and accommodation. No scleral icterus. Extraocular muscles intact.  HEENT: Head atraumatic, normocephalic. Oropharynx and nasopharynx clear.  NECK:  Supple, no jugular venous distention. No thyroid enlargement, no tenderness.  LUNGS: Normal breath sounds bilaterally, no wheezing, rales,rhonchi or crepitation. No use of accessory muscles of respiration.  CARDIOVASCULAR: Regular rate and rhythm, S1, S2 normal. No murmurs, rubs, or gallops.  ABDOMEN: Soft, nondistended, nontender. Bowel sounds present. No organomegaly or mass.  EXTREMITIES: No pedal edema, cyanosis, or clubbing.  NEUROLOGIC: Cranial nerves II through XII are intact. Muscle strength 5/5 in all extremities. Sensation intact. Gait not checked.  PSYCHIATRIC: The patient is alert and oriented x 3.  Normal affect and good eye contact. SKIN: Left lateral breast wound induration with mild tenderness and minimal warmth without drainage.  She has a left axillary wound that is slightly tender and indurated as well.    LABORATORY PANEL:   CBC Recent Labs  Lab 03/19/22 2127  WBC 16.5*  HGB 8.1*  HCT 26.1*  PLT 214   ------------------------------------------------------------------------------------------------------------------  Chemistries  Recent Labs  Lab 03/19/22 2127  NA 138  K 4.3  CL 100  CO2 24  GLUCOSE 94  BUN 48*  CREATININE 11.43*  CALCIUM 9.6  AST 15  ALT 7  ALKPHOS 89  BILITOT 1.2   ------------------------------------------------------------------------------------------------------------------  Cardiac Enzymes No results for input(s): "TROPONINI" in the last 168 hours. ------------------------------------------------------------------------------------------------------------------  RADIOLOGY:  US Venous Img Lower Unilateral Right  Result Date: 03/20/2022 CLINICAL DATA:  Leg swelling and calf pain EXAM: RIGHT LOWER EXTREMITY VENOUS DOPPLER ULTRASOUND TECHNIQUE: Gray-scale  sonography with compression, as well as color and duplex ultrasound, were performed to evaluate the deep venous system(s) from the level of the common femoral vein through the popliteal and proximal calf veins. COMPARISON:  None Available. FINDINGS: VENOUS Normal compressibility of the common femoral, superficial femoral, and popliteal veins, as well as the visualized calf veins. Visualized portions of profunda femoral vein and great saphenous vein unremarkable. No filling defects to suggest DVT on grayscale or color Doppler imaging. Doppler waveforms show normal direction of venous flow, normal respiratory plasticity and response to augmentation. Limited views of the contralateral common femoral vein are unremarkable. OTHER None. Limitations: none IMPRESSION: Negative. Electronically Signed   By: Julian Hy M.D.   On: 03/20/2022 00:19  CT Angio Chest PE W and/or Wo Contrast  Result Date: 03/20/2022 CLINICAL DATA:  Pulmonary embolism (PE) suspected, high prob EXAM: CT ANGIOGRAPHY CHEST WITH CONTRAST TECHNIQUE: Multidetector CT imaging of the chest was performed using the standard protocol during bolus administration of intravenous contrast. Multiplanar CT image reconstructions and MIPs were obtained to evaluate the vascular anatomy. RADIATION DOSE REDUCTION: This exam was performed according to the departmental dose-optimization program which includes automated exposure control, adjustment of the mA and/or kV according to patient size and/or use of iterative reconstruction technique. CONTRAST:  132m OMNIPAQUE IOHEXOL 350 MG/ML SOLN COMPARISON:  12/18/2013 FINDINGS: Cardiovascular: Filling defects within posterior right lower lobe pulmonary arterial branches compatible with pulmonary emboli. Small pulmonary emboli also seen in the left lower lobe and right upper lobe. Mild cardiomegaly. Diffuse coronary artery disease. Aortic atherosclerosis. No aneurysm. Mediastinum/Nodes: No mediastinal, hilar, or  axillary adenopathy. Trachea and esophagus are unremarkable. Thyroid unremarkable. Lungs/Pleura: Bibasilar opacities, likely atelectasis. No effusions. Upper Abdomen: No acute findings Musculoskeletal: Large soft tissue mass partially visualized in the left breast with gas throughout the adjacent soft tissues. Question recent surgical procedure. No acute bony abnormality. Review of the MIP images confirms the above findings. IMPRESSION: Small pulmonary emboli within both lower lobes and the right upper lobe. No evidence of right heart strain. Cardiomegaly, coronary artery disease. Bibasilar atelectasis. Large masslike area in the left breast with surrounding soft tissue gas. Question recent surgery in the left breast. Recommend correlation with history and mammography. Aortic Atherosclerosis (ICD10-I70.0). Electronically Signed   By: KRolm BaptiseM.D.   On: 03/20/2022 00:05   DG Chest Port 1 View  Result Date: 03/19/2022 CLINICAL DATA:  Possible sepsis EXAM: PORTABLE CHEST 1 VIEW COMPARISON:  01/26/2022 FINDINGS: Cardiac shadow is enlarged in size but stable. Aortic calcifications are seen. Loop recorder and right chest wall port are again seen. Increasing central vascular congestion is noted without significant edema. No focal infiltrate or effusion is noted. No bony abnormality is seen. IMPRESSION: Increasing vascular congestion without significant edema. Electronically Signed   By: MInez CatalinaM.D.   On: 03/19/2022 22:34      IMPRESSION AND PLAN:  Assessment and Plan: * Acute pulmonary embolism without acute cor pulmonale (HCC) - The patient has subsequent acute hypoxic respiratory failure. - The patient be admitted to a progressive unit bed. - We will continue her on IV heparin drip. - We will obtain a 2D echo to assess for RV strain and bilateral lower extremity venous Doppler to rule out DVT.  Sepsis due to cellulitis ([facility] - This likely secondary to early left breast cellulitis at the site  of his wounds from mastectomy and sentinel lymph node biopsy. - We will place her on IV Rocephin and follow blood cultures. - We will hydrate with IV normal saline.  ESRD (end stage renal disease) ([facility] -Nephrology follow-up consultation will be obtained. - The patient missed hemodialysis on Friday.  Diabetic neuropathy (HCC) - We will continue Neurontin.  Dyslipidemia - We will continue statin therapy.  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 - The patient will be placed on supplemental coverage with NovoLog and will continue her basal coverage.  Essential hypertension - We will continue Norvasc and Cozaar as well as Toprol-XL.   DVT prophylaxis: Heparin. Advanced Care Planning:  Code Status: full code. Family Communication:  The plan of care was discussed in details with the patient (and family). I answered all questions. The patient agreed to proceed with  the above mentioned plan. Further management will depend upon hospital course. Disposition Plan: Back to previous home environment Consults called: Nephrology All the records are reviewed and case discussed with ED provider.  Status is: Inpatient   At the time of the admission, it appears that the appropriate admission status for this patient is inpatient.  This is judged to be reasonable and necessary in order to provide the required intensity of service to ensure the patient's safety given the presenting symptoms, physical exam findings and initial radiographic and laboratory data in the context of comorbid conditions.  The patient requires inpatient status due to high intensity of service, high risk of further deterioration and high frequency of surveillance required.  I certify that at the time of admission, it is my clinical judgment that the patient will require inpatient hospital care extending more than 2 midnights.                            Dispo: The patient is from: Home               Anticipated d/c is to: Home              Patient currently is not medically stable to d/c.              Difficult to place patient: No  Christel Mormon M.D on 03/20/2022 at 3:31 AM  [facility]   From 7 PM-7 AM, contact night-coverage [email or web address]  CC: Primary care physician; Lowella Bandy, MD

## 2022-03-20 NOTE — Progress Notes (Signed)
ANTICOAGULATION CONSULT NOTE  Pharmacy Consult for heparin infusion Indication: pulmonary embolus  Allergies  Allergen Reactions   Oxycodone Nausea And Vomiting   Oxycodone-Acetaminophen Other (See Comments)   Wound Dressing Adhesive    Hydrocodone     Intolerant more than allergic   Tape Itching    Skin Dermatitis/itching (tape adhesive) Skin Dermatitis/itching (tape adhesive)   Tapentadol Itching    Skin Dermatitis/itching (tape adhesive) Skin Dermatitis/itching (tape adhesive)     Patient Measurements: Height: '5\' 9"'$  (175.3 cm) Weight: 115.2 kg (254 lb) IBW/kg (Calculated) : 66.2 Heparin Dosing Weight: 92.5 kg  Vital Signs: Temp: 96.2 F (35.7 C) (08/27 0904) Temp Source: Axillary (08/27 0904) BP: 123/46 (08/27 0900) Pulse Rate: 63 (08/27 0900)  Labs: Recent Labs    03/19/22 2127 03/20/22 0448 03/20/22 0915  HGB 8.1* 6.6*  --   HCT 26.1* 21.8*  --   PLT 214 163  --   APTT 32  --   --   LABPROT 14.6  --   --   INR 1.2  --   --   HEPARINUNFRC  --   --  >1.10*  CREATININE 11.43* 9.97*  --      Estimated Creatinine Clearance: 7 mL/min (A) (by C-G formula based on SCr of 9.97 mg/dL (H)).   Medical History: Past Medical History:  Diagnosis Date   Acute on chronic respiratory failure with hypoxia ([place]) 07/30/2019   Acute respiratory failure with hypoxia ([place]) 12/31/2018   Carotid arterial disease ([place])    a. 02/2018 < bilat ICA stenosis.   COPD (chronic obstructive pulmonary disease) (HCC)    Coronary artery disease    a. 11/2015 MV: EF 57%, no ischemia/infarct.   Cryptogenic stroke ([place])    a. 10/2017 s/p implantable loop recorder (no Afib to date).   Diabetes mellitus without complication ([place])    Diarrhea    ESRD on dialysis [facility])    GI bleed    a. 01/2019 EGD/Colonoscopy: Mild gastritis. Colitis.   Heart murmur    a. 12/2018 Echo: EF 55-60%, impaired relaxation. Mod dil LA. Mildly dil RA. Mild Ca2+ of AoV.   Hematemesis 06/27/2017   History of 2019 novel  coronavirus disease (COVID-19) 07/30/2019   Hyperkalemia    Hyperlipidemia    Hypertension    Intractable nausea and vomiting 08/08/2015   Nausea & vomiting 05/30/2018   Nausea and vomiting 05/29/2018   Pneumonia due to COVID-19 virus     Medications:  Pt given initial dose of heparin 5000 units 8/27 @ 0045  Assessment: Pt is a 71 yo female w/ ESRD on HD presenting to ED c/o increase weakness & fatigue, found w/ small pulmonary emboli within both lower lobes and the right upper lobe.  8/27 0915 HL > 1.1  Goal of Therapy:  Heparin level 0.3-0.7 units/ml Monitor platelets by anticoagulation protocol: Yes   Plan:  Heparin level is supratherapeutic. Will hold heparin for 1 hours and decrease the heparin infusion to 1300 units/hr. Recheck heparin heparin level in 8 hours. CBC daily while on heparin.   Eleonore Chiquito, PharmD, 03/20/2022 10:24 AM

## 2022-03-20 NOTE — Assessment & Plan Note (Addendum)
-   The patient has subsequent acute hypoxic respiratory failure. - The patient be admitted to a progressive unit bed. - We will continue her on IV heparin drip. - We will obtain a 2D echo to assess for RV strain and bilateral lower extremity venous Doppler to rule out DVT.

## 2022-03-20 NOTE — Assessment & Plan Note (Signed)
-   We will continue statin therapy. 

## 2022-03-20 NOTE — Assessment & Plan Note (Addendum)
-   This likely secondary to early left breast cellulitis at the site of his wounds from mastectomy and sentinel lymph node biopsy. - We will place her on IV Rocephin and follow blood cultures. - We will hydrate with IV normal saline.

## 2022-03-20 NOTE — ED Provider Notes (Signed)
Patient received in signout from Dr. Joni Fears pending CTA chest and reevaluation after fluid bolus.  I reviewed the CT scan which does demonstrate signs of bilateral PE without stigmata of right heart strain.  No signs of pneumonia, but does question a mass to the left breast.  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  When I examined the patient, she has a healing incision to the lateral aspect of the left breast with some induration to the posterior portion of this, but no clear skin changes or erythema and certainly nothing coming to head or any purulence.  She is on appropriate antibiotics to cover for skin and soft tissue infections, including a purulent infection.  I educated the patient of diagnosis of PE.  I will consult with medicine for admission.  Her blood pressure is improved with a small fluid bolus I see no indications for vasopressors or ICU admission at this point.  .Critical Care  Performed by: Vladimir Crofts, MD Authorized by: Vladimir Crofts, MD   Critical care provider statement:    Critical care time (minutes):  30   Critical care time was exclusive of:  Separately billable procedures and treating other patients   Critical care was necessary to treat or prevent imminent or life-threatening deterioration of the following conditions:  Sepsis, circulatory failure and respiratory failure   Critical care was time spent personally by me on the following activities:  Development of treatment plan with patient or surrogate, discussions with consultants, evaluation of patient's response to treatment, examination of patient, ordering and review of laboratory studies, ordering and review of radiographic studies, ordering and performing treatments and interventions, pulse oximetry, re-evaluation of patient's condition and review of old charts     Vladimir Crofts, MD 03/20/22 [phone]

## 2022-03-20 NOTE — Progress Notes (Signed)
PHARMACY - PHYSICIAN COMMUNICATION CRITICAL VALUE ALERT - BLOOD CULTURE IDENTIFICATION (BCID)  Cynthia Dean is an 71 y.o. female who presented to [facility] on 03/19/2022 with a chief complaint of generalized weakness and fatigue. Past medical history includes ESRD on HD MWF.  Assessment:  Admitted with pulmonary embolism and sepsis due to cellulitis. Blood cultures growing 2/4 Staph species (both sets).  Name of physician (or Provider) Contacted: Dr. Jimmye Norman  Current antibiotics: ceftriaxone  Changes to prescribed antibiotics recommended: Could consider initiation of vancomycin. Plan is to start doxycycline with close monitoring.  Results for orders placed or performed during the hospital encounter of 03/19/22  Blood Culture ID Panel (Reflexed) (Collected: 03/19/2022  9:28 PM)  Result Value Ref Range   Enterococcus faecalis NOT DETECTED NOT DETECTED   Enterococcus Faecium NOT DETECTED NOT DETECTED   Listeria monocytogenes NOT DETECTED NOT DETECTED   Staphylococcus species DETECTED (A) NOT DETECTED   Staphylococcus aureus (BCID) NOT DETECTED NOT DETECTED   Staphylococcus epidermidis NOT DETECTED NOT DETECTED   Staphylococcus lugdunensis NOT DETECTED NOT DETECTED   Streptococcus species NOT DETECTED NOT DETECTED   Streptococcus agalactiae NOT DETECTED NOT DETECTED   Streptococcus pneumoniae NOT DETECTED NOT DETECTED   Streptococcus pyogenes NOT DETECTED NOT DETECTED   A.calcoaceticus-baumannii NOT DETECTED NOT DETECTED   Bacteroides fragilis NOT DETECTED NOT DETECTED   Enterobacterales NOT DETECTED NOT DETECTED   Enterobacter cloacae complex NOT DETECTED NOT DETECTED   Escherichia coli NOT DETECTED NOT DETECTED   Klebsiella aerogenes NOT DETECTED NOT DETECTED   Klebsiella oxytoca NOT DETECTED NOT DETECTED   Klebsiella pneumoniae NOT DETECTED NOT DETECTED   Proteus species NOT DETECTED NOT DETECTED   Salmonella species NOT DETECTED NOT DETECTED   Serratia marcescens NOT  DETECTED NOT DETECTED   Haemophilus influenzae NOT DETECTED NOT DETECTED   Neisseria meningitidis NOT DETECTED NOT DETECTED   Pseudomonas aeruginosa NOT DETECTED NOT DETECTED   Stenotrophomonas maltophilia NOT DETECTED NOT DETECTED   Candida albicans NOT DETECTED NOT DETECTED   Candida auris NOT DETECTED NOT DETECTED   Candida glabrata NOT DETECTED NOT DETECTED   Candida krusei NOT DETECTED NOT DETECTED   Candida parapsilosis NOT DETECTED NOT DETECTED   Candida tropicalis NOT DETECTED NOT DETECTED   Cryptococcus neoformans/gattii NOT DETECTED NOT DETECTED    Glean Salvo, PharmD Clinical Pharmacist  03/20/2022 5:35 PM

## 2022-03-20 NOTE — Progress Notes (Signed)
PROGRESS NOTE    Cynthia Dean  SVX:[id number] DOB: [birth year] DOA: 03/19/2022 PCP: Lowella Bandy, MD   Assessment & Plan:   Principal Problem:   Acute pulmonary embolism without acute cor pulmonale (HCC) Active Problems:   Sepsis due to cellulitis (HCC)   ESRD (end stage renal disease) ([place])   Essential hypertension   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[place])   Dyslipidemia   Diabetic neuropathy ([place])  Assessment and Plan: Acute pulmonary embolism: without acute cor pulmonale. Continue on IV heparin drip. Echo ordered.   ACD: likely secondary to ESRD.  W/ possible component of acute blood loss anemia. Will transfuse 1 unit of pRBCs secondary to Hb 6.6. Repeat H&H ordered    Sepsis: see Dr. Randel Books note on how pt met sepsis criteria. Likely secondary to left breast cellulitis. Continue on IV rocephin   ESRD: on HD MWF. Missed HD on Friday. Nephro following  and recs apprec  Breast cancer: recent dx. S/p surgical excision at [facility] & chemo. High risk of blood clots w/ hx of cancer    Diabetic neuropathy: continue on home dose of gabapentin    HLD: continue on statin .   DM2: likely poorly controlled. Continue on 70/30, SSI w/ accuchecks    HTN: continue on metoprolol, losartan          DVT prophylaxis: SCDs Code Status:  full  Family Communication: discussed pt's care w/ pt's daughter, [name], and answered her questions Disposition Plan: depends on PT/OT recs  Level of care: Progressive  Status is: Inpatient Remains inpatient appropriate because: severity of illness    Consultants:  Nephro   Procedures:  Antimicrobials:    Subjective: Pt c/o fatigue  Objective: Vitals:   03/20/22 0600 03/20/22 0630 03/20/22 0700 03/20/22 0730  BP: (!) 113/51 (!) 117/49 (!) 123/54 (!) 108/48  Pulse: (!) 59 (!) 58 (!) 58 (!) 55  Resp: _0 Temp:      TempSrc:      SpO2: 100% 100% 100% 100%  Weight:      Height:         Intake/Output Summary (Last 24 hours) at 03/20/2022 2330 Last data filed at 03/19/2022 2329 Gross per 24 hour  Intake 300 ml  Output --  Net 300 ml   Filed Weights   03/19/22 2123  Weight: 115.2 kg    Examination:  General exam: Appears upset and tearful  Respiratory system: diminished breath sounds b/l  Cardiovascular system: S1 & S2+. No rubs, gallops or clicks.  Gastrointestinal system: Abdomen is obese, soft and nontender. Normal bowel sounds heard. Central nervous system: Alert and oriented. Moves all extremities  Psychiatry: Judgement and insight appear normal. Tearful mood and affect    Data Reviewed: I have personally reviewed following labs and imaging studies  CBC: Recent Labs  Lab 03/19/22 2127 03/20/22 0448  WBC 16.5* 14.4*  NEUTROABS 13.0*  --   HGB 8.1* 6.6*  HCT 26.1* 21.8*  MCV 90.6 93.2  PLT 214 076   Basic Metabolic Panel: Recent Labs  Lab 03/19/22 2127 03/20/22 0448  NA 138 136  K 4.3 3.8  CL 100 109  CO2 24 21*  GLUCOSE 94 105*  BUN 48* 47*  CREATININE 11.43* 9.97*  CALCIUM 9.6 7.9*   GFR: Estimated Creatinine Clearance: 7 mL/min (A) (by C-G formula based on SCr of 9.97 mg/dL (H)). Liver Function Tests: Recent Labs  Lab 03/19/22 2127  AST 15  ALT  7  ALKPHOS 89  BILITOT 1.2  PROT 7.2  ALBUMIN 3.4*   No results for input(s): "LIPASE", "AMYLASE" in the last 168 hours. No results for input(s): "AMMONIA" in the last 168 hours. Coagulation Profile: Recent Labs  Lab 03/19/22 2127  INR 1.2   Cardiac Enzymes: No results for input(s): "CKTOTAL", "CKMB", "CKMBINDEX", "TROPONINI" in the last 168 hours. BNP (last 3 results) No results for input(s): "PROBNP" in the last 8760 hours. HbA1C: No results for input(s): "HGBA1C" in the last 72 hours. CBG: Recent Labs  Lab 03/19/22 2120  GLUCAP 90   Lipid Profile: No results for input(s): "CHOL", "HDL", "LDLCALC", "TRIG", "CHOLHDL", "LDLDIRECT" in the last 72 hours. Thyroid  Function Tests: No results for input(s): "TSH", "T4TOTAL", "FREET4", "T3FREE", "THYROIDAB" in the last 72 hours. Anemia Panel: No results for input(s): "VITAMINB12", "FOLATE", "FERRITIN", "TIBC", "IRON", "RETICCTPCT" in the last 72 hours. Sepsis Labs: Recent Labs  Lab 03/19/22 2127 03/20/22 0026  LATICACIDVEN 2.0* 1.1    Recent Results (from the past 240 hour(s))  Resp Panel by RT-PCR (Flu A&B, Covid) Anterior Nasal Swab     Status: None   Collection Time: 03/19/22  9:28 PM   Specimen: Anterior Nasal Swab  Result Value Ref Range Status   SARS Coronavirus 2 by RT PCR NEGATIVE NEGATIVE Final    Comment: (NOTE) SARS-CoV-2 target nucleic acids are NOT DETECTED.  The SARS-CoV-2 RNA is generally detectable in upper respiratory specimens during the acute phase of infection. The lowest concentration of SARS-CoV-2 viral copies this assay can detect is 138 copies/mL. A negative result does not preclude SARS-Cov-2 infection and should not be used as the sole basis for treatment or other patient management decisions. A negative result may occur with  improper specimen collection/handling, submission of specimen other than nasopharyngeal swab, presence of viral mutation(s) within the areas targeted by this assay, and inadequate number of viral copies(<138 copies/mL). A negative result must be combined with clinical observations, patient history, and epidemiological information. The expected result is Negative.  Fact Sheet for Patients:  https://www.fda.gov/media/152166/download  Fact Sheet for Healthcare Providers:  https://www.fda.gov/media/152162/download  This test is no t yet approved or cleared by the [place] FDA and  has been authorized for detection and/or diagnosis of SARS-CoV-2 by FDA under an Emergency Use Authorization (EUA). This EUA will remain  in effect (meaning this test can be used) for the duration of the COVID-19 declaration under Section 564(b)(1) of the Act,  21 U.S.C.section 360bbb-3(b)(1), unless the authorization is terminated  or revoked sooner.       Influenza A by PCR NEGATIVE NEGATIVE Final   Influenza B by PCR NEGATIVE NEGATIVE Final    Comment: (NOTE) The Xpert Xpress SARS-CoV-2/FLU/RSV plus assay is intended as an aid in the diagnosis of influenza from Nasopharyngeal swab specimens and should not be used as a sole basis for treatment. Nasal washings and aspirates are unacceptable for Xpert Xpress SARS-CoV-2/FLU/RSV testing.  Fact Sheet for Patients: https://www.fda.gov/media/152166/download  Fact Sheet for Healthcare Providers: https://www.fda.gov/media/152162/download  This test is not yet approved or cleared by the [place] FDA and has been authorized for detection and/or diagnosis of SARS-CoV-2 by FDA under an Emergency Use Authorization (EUA). This EUA will remain in effect (meaning this test can be used) for the duration of the COVID-19 declaration under Section 564(b)(1) of the Act, 21 U.S.C. section 360bbb-3(b)(1), unless the authorization is terminated or revoked.  Performed at [facility], [address] [place], [place] [address]   Blood   Culture (routine x 2)     Status: None (Preliminary result)   Collection Time: 03/19/22  9:28 PM   Specimen: BLOOD  Result Value Ref Range Status   Specimen Description BLOOD RIGHT ANTECUBITAL  Final   Special Requests   Final    BOTTLES DRAWN AEROBIC AND ANAEROBIC Blood Culture adequate volume   Culture   Final    NO GROWTH < 12 HOURS Performed at [facility], [address] [place], [place] [address]    Report Status PENDING  Incomplete  Blood Culture (routine x 2)     Status: None (Preliminary result)   Collection Time: 03/19/22 10:23 PM   Specimen: BLOOD  Result Value Ref Range Status   Specimen Description BLOOD BLOOD RIGHT FOREARM  Final   Special Requests   Final    BOTTLES DRAWN AEROBIC AND ANAEROBIC Blood Culture adequate volume    Culture   Final    NO GROWTH < 12 HOURS Performed at [facility], [address], [place], [place] [address]    Report Status PENDING  Incomplete         Radiology Studies: US Venous Img Lower Unilateral Left (DVT)  Result Date: 03/20/2022 CLINICAL DATA:  71 year old female with history of pulmonary embolism. EXAM: LEFT LOWER EXTREMITY VENOUS DOPPLER ULTRASOUND TECHNIQUE: Gray-scale sonography with compression, as well as color and duplex ultrasound, were performed to evaluate the deep venous system(s) from the level of the common femoral vein through the popliteal and proximal calf veins. COMPARISON:  No priors. FINDINGS: VENOUS Normal compressibility of the common femoral, superficial femoral, and popliteal veins, as well as the visualized calf veins. Visualized portions of profunda femoral vein and great saphenous vein unremarkable. No filling defects to suggest DVT on grayscale or color Doppler imaging. Doppler waveforms show normal direction of venous flow, normal respiratory plasticity and response to augmentation. Limited views of the contralateral common femoral vein are unremarkable. OTHER None. Limitations: none IMPRESSION: Negative. Electronically Signed   By: Vinnie Langton M.D.   On: 03/20/2022 05:04   US Venous Img Lower Unilateral Right  Result Date: 03/20/2022 CLINICAL DATA:  Leg swelling and calf pain EXAM: RIGHT LOWER EXTREMITY VENOUS DOPPLER ULTRASOUND TECHNIQUE: Gray-scale sonography with compression, as well as color and duplex ultrasound, were performed to evaluate the deep venous system(s) from the level of the common femoral vein through the popliteal and proximal calf veins. COMPARISON:  None Available. FINDINGS: VENOUS Normal compressibility of the common femoral, superficial femoral, and popliteal veins, as well as the visualized calf veins. Visualized portions of profunda femoral vein and great saphenous vein unremarkable. No filling defects to suggest DVT  on grayscale or color Doppler imaging. Doppler waveforms show normal direction of venous flow, normal respiratory plasticity and response to augmentation. Limited views of the contralateral common femoral vein are unremarkable. OTHER None. Limitations: none IMPRESSION: Negative. Electronically Signed   By: Julian Hy M.D.   On: 03/20/2022 00:19   CT Angio Chest PE W and/or Wo Contrast  Result Date: 03/20/2022 CLINICAL DATA:  Pulmonary embolism (PE) suspected, high prob EXAM: CT ANGIOGRAPHY CHEST WITH CONTRAST TECHNIQUE: Multidetector CT imaging of the chest was performed using the standard protocol during bolus administration of intravenous contrast. Multiplanar CT image reconstructions and MIPs were obtained to evaluate the vascular anatomy. RADIATION DOSE REDUCTION: This exam was performed according to the departmental dose-optimization program which includes automated exposure control, adjustment of the mA and/or kV according to patient size and/or use of iterative  reconstruction technique. CONTRAST:  162m OMNIPAQUE IOHEXOL 350 MG/ML SOLN COMPARISON:  12/18/2013 FINDINGS: Cardiovascular: Filling defects within posterior right lower lobe pulmonary arterial branches compatible with pulmonary emboli. Small pulmonary emboli also seen in the left lower lobe and right upper lobe. Mild cardiomegaly. Diffuse coronary artery disease. Aortic atherosclerosis. No aneurysm. Mediastinum/Nodes: No mediastinal, hilar, or axillary adenopathy. Trachea and esophagus are unremarkable. Thyroid unremarkable. Lungs/Pleura: Bibasilar opacities, likely atelectasis. No effusions. Upper Abdomen: No acute findings Musculoskeletal: Large soft tissue mass partially visualized in the left breast with gas throughout the adjacent soft tissues. Question recent surgical procedure. No acute bony abnormality. Review of the MIP images confirms the above findings. IMPRESSION: Small pulmonary emboli within both lower lobes and the right  upper lobe. No evidence of right heart strain. Cardiomegaly, coronary artery disease. Bibasilar atelectasis. Large masslike area in the left breast with surrounding soft tissue gas. Question recent surgery in the left breast. Recommend correlation with history and mammography. Aortic Atherosclerosis (ICD10-I70.0). Electronically Signed   By: KRolm BaptiseM.D.   On: 03/20/2022 00:05   DG Chest Port 1 View  Result Date: 03/19/2022 CLINICAL DATA:  Possible sepsis EXAM: PORTABLE CHEST 1 VIEW COMPARISON:  01/26/2022 FINDINGS: Cardiac shadow is enlarged in size but stable. Aortic calcifications are seen. Loop recorder and right chest wall port are again seen. Increasing central vascular congestion is noted without significant edema. No focal infiltrate or effusion is noted. No bony abnormality is seen. IMPRESSION: Increasing vascular congestion without significant edema. Electronically Signed   By: MInez CatalinaM.D.   On: 03/19/2022 22:34        Scheduled Meds:  sodium chloride   Intravenous Once   aspirin EC  81 mg Oral Daily   atorvastatin  80 mg Oral QPM   calcium acetate  2,001 mg Oral TID WC   vitamin D3  1,000 Units Oral Daily   cinacalcet  30 mg Oral Q breakfast   clopidogrel  75 mg Oral Daily   fluticasone  1 spray Each Nare Daily   gabapentin  100 mg Oral BID   insulin aspart protamine- aspart  28 Units Subcutaneous BID WC   ipratropium-albuterol  3 mL Nebulization BID   levETIRAcetam  1,000 mg Oral Daily   [START ON 03/21/2022] levETIRAcetam  250 mg Oral Once per day on Mon Wed Fri   losartan  100 mg Oral QHS   metoprolol succinate  100 mg Oral Q breakfast   pantoprazole  40 mg Oral Daily   torsemide  100 mg Oral Q breakfast   Continuous Infusions:  sodium chloride 100 mL/hr at 03/20/22 0220   cefTRIAXone (ROCEPHIN)  IV     heparin 1,500 Units/hr (03/20/22 0124)     LOS: 0 days    Time spent: 35 mins     JWyvonnia Dusky MD [facility] Pager [phone]  If  7PM-7AM, please contact night-coverage [email or web address] 03/20/2022, 8:08 AM

## 2022-03-20 NOTE — ED Notes (Signed)
Pt sleeping. 

## 2022-03-21 DIAGNOSIS — L0390 Cellulitis, unspecified: Secondary | ICD-10-CM | POA: Diagnosis not present

## 2022-03-21 DIAGNOSIS — I2699 Other pulmonary embolism without acute cor pulmonale: Secondary | ICD-10-CM | POA: Diagnosis not present

## 2022-03-21 DIAGNOSIS — A419 Sepsis, unspecified organism: Secondary | ICD-10-CM | POA: Diagnosis not present

## 2022-03-21 DIAGNOSIS — N186 End stage renal disease: Secondary | ICD-10-CM | POA: Diagnosis not present

## 2022-03-21 LAB — CBC
HCT: 22.7 % — ABNORMAL LOW (ref 36.0–46.0)
Hemoglobin: 7.1 g/dL — ABNORMAL LOW (ref 12.0–15.0)
MCH: 27.3 pg (ref 26.0–34.0)
MCHC: 31.3 g/dL (ref 30.0–36.0)
MCV: 87.3 fL (ref 80.0–100.0)
Platelets: 168 10*3/uL (ref 150–400)
RBC: 2.6 MIL/uL — ABNORMAL LOW (ref 3.87–5.11)
RDW: 17.2 % — ABNORMAL HIGH (ref 11.5–15.5)
WBC: 9.6 10*3/uL (ref 4.0–10.5)
nRBC: 0 % (ref 0.0–0.2)

## 2022-03-21 LAB — TYPE AND SCREEN
ABO/RH(D): A POS
Antibody Screen: NEGATIVE
Unit division: 0

## 2022-03-21 LAB — BPAM RBC
Blood Product Expiration Date: 202309212359
ISSUE DATE / TIME: 202308271047
Unit Type and Rh: 6200

## 2022-03-21 LAB — HEMOGLOBIN AND HEMATOCRIT, BLOOD
HCT: 23.8 % — ABNORMAL LOW (ref 36.0–46.0)
Hemoglobin: 7.5 g/dL — ABNORMAL LOW (ref 12.0–15.0)

## 2022-03-21 LAB — CBG MONITORING, ED
Glucose-Capillary: 103 mg/dL — ABNORMAL HIGH (ref 70–99)
Glucose-Capillary: 154 mg/dL — ABNORMAL HIGH (ref 70–99)
Glucose-Capillary: 152 mg/dL — ABNORMAL HIGH (ref 70–99)

## 2022-03-21 LAB — HEPATITIS B SURFACE ANTIGEN: Hepatitis B Surface Ag: NONREACTIVE

## 2022-03-21 LAB — HEPARIN LEVEL (UNFRACTIONATED)
Heparin Unfractionated: 0.1 IU/mL — ABNORMAL LOW (ref 0.30–0.70)
Heparin Unfractionated: 0.16 IU/mL — ABNORMAL LOW (ref 0.30–0.70)

## 2022-03-21 LAB — BASIC METABOLIC PANEL
Anion gap: 12 (ref 5–15)
BUN: 60 mg/dL — ABNORMAL HIGH (ref 8–23)
CO2: 22 mmol/L (ref 22–32)
Calcium: 8.7 mg/dL — ABNORMAL LOW (ref 8.9–10.3)
Chloride: 101 mmol/L (ref 98–111)
Creatinine, Ser: 12.47 mg/dL — ABNORMAL HIGH (ref 0.44–1.00)
GFR, Estimated: 3 mL/min — ABNORMAL LOW (ref >60)
Glucose, Bld: 84 mg/dL (ref 70–99)
Potassium: 4.6 mmol/L (ref 3.5–5.1)
Sodium: 135 mmol/L (ref 135–145)

## 2022-03-21 MED ORDER — PENTAFLUOROPROP-TETRAFLUOROETH EX AERO: 1.0000 | INHALATION_SPRAY | CUTANEOUS | Status: DC | PRN

## 2022-03-21 MED ORDER — ALTEPLASE 2 MG IJ SOLR: 2.0000 mg | Freq: Once | INTRAMUSCULAR | Status: DC | PRN

## 2022-03-21 MED ORDER — INSULIN ASPART PROT & ASPART (70-30 MIX) 100 UNIT/ML ~~LOC~~ SUSP
10.0000 [IU] | Freq: Two times a day (BID) | SUBCUTANEOUS | Status: DC
  Administered 2022-03-21 – 2022-04-01 (×18): 10 [IU] via SUBCUTANEOUS
  Filled 2022-03-21 (×5): qty 10

## 2022-03-21 MED ORDER — ANTICOAGULANT SODIUM CITRATE 4% (200MG/5ML) IV SOLN
5.0000 mL | Status: DC | PRN
  Filled 2022-03-21: qty 5

## 2022-03-21 MED ORDER — HEPARIN BOLUS VIA INFUSION
2700.0000 [IU] | Freq: Once | INTRAVENOUS | Status: AC
  Administered 2022-03-21: 2700 [IU] via INTRAVENOUS
  Filled 2022-03-21: qty 2700

## 2022-03-21 MED ORDER — SODIUM CHLORIDE 0.9 % IV SOLN: INTRAVENOUS | Status: DC | PRN

## 2022-03-21 MED ORDER — POLYETHYLENE GLYCOL 3350 17 G PO PACK
17.0000 g | PACK | Freq: Every day | ORAL | Status: DC
  Administered 2022-03-21 – 2022-04-01 (×6): 17 g via ORAL
  Filled 2022-03-21 (×9): qty 1

## 2022-03-21 MED ORDER — HEPARIN SODIUM (PORCINE) 1000 UNIT/ML DIALYSIS: 1000.0000 [IU] | INTRAMUSCULAR | Status: DC | PRN

## 2022-03-21 MED ORDER — DOCUSATE SODIUM 100 MG PO CAPS
200.0000 mg | ORAL_CAPSULE | Freq: Two times a day (BID) | ORAL | Status: DC
  Administered 2022-03-21 – 2022-03-30 (×8): 200 mg via ORAL
  Filled 2022-03-21 (×16): qty 2

## 2022-03-21 MED ORDER — HEPARIN BOLUS VIA INFUSION
5500.0000 [IU] | Freq: Once | INTRAVENOUS | Status: AC
  Administered 2022-03-21: 5500 [IU] via INTRAVENOUS
  Filled 2022-03-21: qty 5500

## 2022-03-21 MED ORDER — LIDOCAINE-PRILOCAINE 2.5-2.5 % EX CREA: 1.0000 | TOPICAL_CREAM | CUTANEOUS | Status: DC | PRN

## 2022-03-21 MED ORDER — LIDOCAINE HCL (PF) 1 % IJ SOLN: 5.0000 mL | INTRAMUSCULAR | Status: DC | PRN

## 2022-03-21 MED ORDER — CHLORHEXIDINE GLUCONATE CLOTH 2 % EX PADS
6.0000 | MEDICATED_PAD | Freq: Every day | CUTANEOUS | Status: DC
  Administered 2022-03-22 – 2022-04-01 (×10): 6 via TOPICAL
  Filled 2022-03-21: qty 6

## 2022-03-21 NOTE — Progress Notes (Signed)
ANTICOAGULATION CONSULT NOTE  Pharmacy Consult for heparin infusion Indication: pulmonary embolus  Allergies  Allergen Reactions   Oxycodone Nausea And Vomiting   Oxycodone-Acetaminophen Other (See Comments)   Wound Dressing Adhesive    Hydrocodone     Intolerant more than allergic   Tape Itching    Skin Dermatitis/itching (tape adhesive) Skin Dermatitis/itching (tape adhesive)   Tapentadol Itching    Skin Dermatitis/itching (tape adhesive) Skin Dermatitis/itching (tape adhesive)     Patient Measurements: Height: '5\' 9"'$  (175.3 cm) Weight: 115.2 kg (254 lb) IBW/kg (Calculated) : 66.2 Heparin Dosing Weight: 92.5 kg  Vital Signs: Temp: 97.7 F (36.5 C) (08/28 0300) Temp Source: Oral (08/28 0300) BP: 127/60 (08/28 0300) Pulse Rate: 59 (08/28 0300)  Labs: Recent Labs    03/19/22 2127 03/20/22 0448 03/20/22 0915 03/20/22 1901 03/21/22 0655  HGB 8.1* 6.6*  --  6.9* 7.1*  HCT 26.1* 21.8*  --  22.3* 22.7*  PLT 214 163  --   --  168  APTT 32  --   --   --   --   LABPROT 14.6  --   --   --   --   INR 1.2  --   --   --   --   HEPARINUNFRC  --   --  >1.10* >1.10* 0.10*  CREATININE 11.43* 9.97*  --   --  12.47*     Estimated Creatinine Clearance: 5.6 mL/min (A) (by C-G formula based on SCr of 12.47 mg/dL (H)).   Medical History: Past Medical History:  Diagnosis Date   Acute on chronic respiratory failure with hypoxia ([place]) 07/30/2019   Acute respiratory failure with hypoxia ([place]) 12/31/2018   Carotid arterial disease ([place])    a. 02/2018 < bilat ICA stenosis.   COPD (chronic obstructive pulmonary disease) (HCC)    Coronary artery disease    a. 11/2015 MV: EF 57%, no ischemia/infarct.   Cryptogenic stroke ([place])    a. 10/2017 s/p implantable loop recorder (no Afib to date).   Diabetes mellitus without complication ([place])    Diarrhea    ESRD on dialysis [facility])    GI bleed    a. 01/2019 EGD/Colonoscopy: Mild gastritis. Colitis.   Heart murmur    a. 12/2018 Echo: EF 55-60%,  impaired relaxation. Mod dil LA. Mildly dil RA. Mild Ca2+ of AoV.   Hematemesis 06/27/2017   History of 2019 novel coronavirus disease (COVID-19) 07/30/2019   Hyperkalemia    Hyperlipidemia    Hypertension    Intractable nausea and vomiting 08/08/2015   Nausea & vomiting 05/30/2018   Nausea and vomiting 05/29/2018   Pneumonia due to COVID-19 virus     Medications:  Pt given initial dose of heparin 5000 units 8/27 @ 0045  Assessment: Pt is a 71 yo female w/ ESRD on HD presenting to ED c/o increase weakness & fatigue, found w/ small pulmonary emboli within both lower lobes and the right upper lobe.  8/27 0915 HL > 1.1 8/27 1901 HL > 1.1 8/28 0655 HL <0.10  Goal of Therapy:  Heparin level 0.3-0.7 units/ml Monitor platelets by anticoagulation protocol: Yes   Plan:  HL subtherapeutic. RN informs me heparin drip was stopped prior to shift change (not sure how long it was stopped for) Give heparin 5500 units IV bolus then restart heparin drip at 1000 units/hr Recheck heparin heparin level in 8 hours. CBC daily while on heparin.   [place] Pharmacist 03/21/2022 7:52 AM

## 2022-03-21 NOTE — Progress Notes (Addendum)
PROGRESS NOTE    Cynthia Dean  EML:[id number] DOB: 1950-09-09 DOA: 03/19/2022 PCP: Lowella Bandy, MD   Assessment & Plan:   Principal Problem:   Acute pulmonary embolism without acute cor pulmonale (HCC) Active Problems:   Sepsis due to cellulitis (HCC)   ESRD (end stage renal disease) ([place])   Essential hypertension   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[place])   Dyslipidemia   Diabetic neuropathy ([place])  Assessment and Plan: Acute pulmonary embolism: without acute cor pulmonale. Continue on IV heparin drip. Echo ordered. Holding home dose of aspirin, plavix   ACD: likely secondary to ESRD.  W/ possible component of acute blood loss anemia. S/p 1 unit of pRBCs. Repeat H&H this afternoon. Will transfuse another unit if Hb < 7.0    Sepsis: see Dr. Randel Books note on how pt met sepsis criteria. Likely secondary to left breast cellulitis. Continue on IV rocephin. Procal 3.92. Sepsis resolved  Mild cellulitis of left breast: continue on IV rocephin.   Unlikely bacteremia: blood cxs growing staph capitis, likely a containment. Repeat blood cxs pending. Continue on IV doxycycline  Possible UTI: urine cx staph epidermidis, sens pending. Continue on IV abxs   ESRD: on HD MWF. Missed HD on Friday. Will get HD today as per nephro. Nephro recs apprec   Breast cancer: recent dx. S/p surgical excision at [facility] & chemo. High risk of blood clots w/ hx of cancer    Diabetic neuropathy: continue on home dose of gabapentin    HLD: continue on statin    DM2: likely poorly controlled. Continue on 70/30, SSI w/ accuchecks    HTN: continue on losartan, metoprolol           DVT prophylaxis: SCDs Code Status:  full  Family Communication: discussed pt's care daughter, [name], and answered her questions Disposition Plan: depends on PT/OT recs  Level of care: Progressive  Status is: Inpatient Remains inpatient appropriate because: severity of  illness    Consultants:  Nephro   Procedures:  Antimicrobials:    Subjective: Pt c/o malaise   Objective: Vitals:   03/20/22 2100 03/20/22 2110 03/20/22 2330 03/21/22 0300  BP: (!) 140/65  131/62 127/60  Pulse: 61  60 (!) 59  Resp: _0 Temp:  97.6 F (36.4 C) 97.9 F (36.6 C) 97.7 F (36.5 C)  TempSrc:  Oral Oral Oral  SpO2: 100%  100% 100%  Weight:      Height:        Intake/Output Summary (Last 24 hours) at 03/21/2022 0822 Last data filed at 03/21/2022 0651 Gross per 24 hour  Intake 1911.57 ml  Output --  Net 1911.57 ml   Filed Weights   03/19/22 2123  Weight: 115.2 kg    Examination:  General exam: Appears calm & comfortable  Respiratory system: decreased breath sounds b/l  Cardiovascular system: S1/S2+. No rubs or clicks  Gastrointestinal system: abd is soft, NT, obese & hypoactive bowel sounds. Central nervous system: Alert and oriented. Moves all extremities  Psychiatry: Judgement and insight appears at baseline. Flat mood and affect    Data Reviewed: I have personally reviewed following labs and imaging studies  CBC: Recent Labs  Lab 03/19/22 2127 03/20/22 0448 03/20/22 1901 03/21/22 0655  WBC 16.5* 14.4*  --  9.6  NEUTROABS 13.0*  --   --   --   HGB 8.1* 6.6* 6.9* 7.1*  HCT 26.1* 21.8* 22.3* 22.7*  MCV 90.6 93.2  --  87.3  PLT 214 163  --  469   Basic Metabolic Panel: Recent Labs  Lab 03/19/22 2127 03/20/22 0448 03/21/22 0655  NA 138 136 135  K 4.3 3.8 4.6  CL 100 109 101  CO2 24 21* 22  GLUCOSE 94 105* 84  BUN 48* 47* 60*  CREATININE 11.43* 9.97* 12.47*  CALCIUM 9.6 7.9* 8.7*   GFR: Estimated Creatinine Clearance: 5.6 mL/min (A) (by C-G formula based on SCr of 12.47 mg/dL (H)). Liver Function Tests: Recent Labs  Lab 03/19/22 2127  AST 15  ALT 7  ALKPHOS 89  BILITOT 1.2  PROT 7.2  ALBUMIN 3.4*   No results for input(s): "LIPASE", "AMYLASE" in the last 168 hours. No results for input(s): "AMMONIA" in the  last 168 hours. Coagulation Profile: Recent Labs  Lab 03/19/22 2127  INR 1.2   Cardiac Enzymes: No results for input(s): "CKTOTAL", "CKMB", "CKMBINDEX", "TROPONINI" in the last 168 hours. BNP (last 3 results) No results for input(s): "PROBNP" in the last 8760 hours. HbA1C: No results for input(s): "HGBA1C" in the last 72 hours. CBG: Recent Labs  Lab 03/19/22 2120 03/20/22 1859 03/20/22 2059  GLUCAP 90 131* 117*   Lipid Profile: No results for input(s): "CHOL", "HDL", "LDLCALC", "TRIG", "CHOLHDL", "LDLDIRECT" in the last 72 hours. Thyroid Function Tests: No results for input(s): "TSH", "T4TOTAL", "FREET4", "T3FREE", "THYROIDAB" in the last 72 hours. Anemia Panel: No results for input(s): "VITAMINB12", "FOLATE", "FERRITIN", "TIBC", "IRON", "RETICCTPCT" in the last 72 hours. Sepsis Labs: Recent Labs  Lab 03/19/22 2127 03/20/22 0026 03/20/22 0448  PROCALCITON  --   --  3.92  LATICACIDVEN 2.0* 1.1  --     Recent Results (from the past 240 hour(s))  Resp Panel by RT-PCR (Flu A&B, Covid) Anterior Nasal Swab     Status: None   Collection Time: 03/19/22  9:28 PM   Specimen: Anterior Nasal Swab  Result Value Ref Range Status   SARS Coronavirus 2 by RT PCR NEGATIVE NEGATIVE Final    Comment: (NOTE) SARS-CoV-2 target nucleic acids are NOT DETECTED.  The SARS-CoV-2 RNA is generally detectable in upper respiratory specimens during the acute phase of infection. The lowest concentration of SARS-CoV-2 viral copies this assay can detect is 138 copies/mL. A negative result does not preclude SARS-Cov-2 infection and should not be used as the sole basis for treatment or other patient management decisions. A negative result may occur with  improper specimen collection/handling, submission of specimen other than nasopharyngeal swab, presence of viral mutation(s) within the areas targeted by this assay, and inadequate number of viral copies(<138 copies/mL). A negative result must be  combined with clinical observations, patient history, and epidemiological information. The expected result is Negative.  Fact Sheet for Patients:  EntrepreneurPulse.com.au  Fact Sheet for Healthcare Providers:  IncredibleEmployment.be  This test is no t yet approved or cleared by the [place] FDA and  has been authorized for detection and/or diagnosis of SARS-CoV-2 by FDA under an Emergency Use Authorization (EUA). This EUA will remain  in effect (meaning this test can be used) for the duration of the COVID-19 declaration under Section 564(b)(1) of the Act, 21 U.S.C.section 360bbb-3(b)(1), unless the authorization is terminated  or revoked sooner.       Influenza A by PCR NEGATIVE NEGATIVE Final   Influenza B by PCR NEGATIVE NEGATIVE Final    Comment: (NOTE) The Xpert Xpress SARS-CoV-2/FLU/RSV plus assay is intended as an aid in the diagnosis of influenza from Nasopharyngeal swab specimens and should  not be used as a sole basis for treatment. Nasal washings and aspirates are unacceptable for Xpert Xpress SARS-CoV-2/FLU/RSV testing.  Fact Sheet for Patients: EntrepreneurPulse.com.au  Fact Sheet for Healthcare Providers: IncredibleEmployment.be  This test is not yet approved or cleared by the [place] FDA and has been authorized for detection and/or diagnosis of SARS-CoV-2 by FDA under an Emergency Use Authorization (EUA). This EUA will remain in effect (meaning this test can be used) for the duration of the COVID-19 declaration under Section 564(b)(1) of the Act, 21 U.S.C. section 360bbb-3(b)(1), unless the authorization is terminated or revoked.  Performed at [facility], [address] [place], [place] [address]   Blood Culture (routine x 2)     Status: None (Preliminary result)   Collection Time: 03/19/22  9:28 PM   Specimen: BLOOD  Result Value Ref Range Status   Specimen  Description BLOOD RIGHT ANTECUBITAL  Final   Special Requests   Final    BOTTLES DRAWN AEROBIC AND ANAEROBIC Blood Culture adequate volume   Culture  Setup Time   Final    GRAM POSITIVE COCCI IN BOTH AEROBIC AND ANAEROBIC BOTTLES CRITICAL RESULT CALLED TO, READ BACK BY AND VERIFIED WITH: CAROLYN CHILDS 03/20/22 1457 AMK Performed at [facility], [address] [place], [place] [address]    Culture GRAM POSITIVE COCCI  Final   Report Status PENDING  Incomplete  Blood Culture ID Panel (Reflexed)     Status: Abnormal   Collection Time: 03/19/22  9:28 PM  Result Value Ref Range Status   Enterococcus faecalis NOT DETECTED NOT DETECTED Final   Enterococcus Faecium NOT DETECTED NOT DETECTED Final   Listeria monocytogenes NOT DETECTED NOT DETECTED Final   Staphylococcus species DETECTED (A) NOT DETECTED Final    Comment: CRITICAL RESULT CALLED TO, READ BACK BY AND VERIFIED WITH: CAROLYN CHILDS 03/20/22 1457 AMK    Staphylococcus aureus (BCID) NOT DETECTED NOT DETECTED Final   Staphylococcus epidermidis NOT DETECTED NOT DETECTED Final   Staphylococcus lugdunensis NOT DETECTED NOT DETECTED Final   Streptococcus species NOT DETECTED NOT DETECTED Final   Streptococcus agalactiae NOT DETECTED NOT DETECTED Final   Streptococcus pneumoniae NOT DETECTED NOT DETECTED Final   Streptococcus pyogenes NOT DETECTED NOT DETECTED Final   A.calcoaceticus-baumannii NOT DETECTED NOT DETECTED Final   Bacteroides fragilis NOT DETECTED NOT DETECTED Final   Enterobacterales NOT DETECTED NOT DETECTED Final   Enterobacter cloacae complex NOT DETECTED NOT DETECTED Final   Escherichia coli NOT DETECTED NOT DETECTED Final   Klebsiella aerogenes NOT DETECTED NOT DETECTED Final   Klebsiella oxytoca NOT DETECTED NOT DETECTED Final   Klebsiella pneumoniae NOT DETECTED NOT DETECTED Final   Proteus species NOT DETECTED NOT DETECTED Final   Salmonella species NOT DETECTED NOT DETECTED Final   Serratia  marcescens NOT DETECTED NOT DETECTED Final   Haemophilus influenzae NOT DETECTED NOT DETECTED Final   Neisseria meningitidis NOT DETECTED NOT DETECTED Final   Pseudomonas aeruginosa NOT DETECTED NOT DETECTED Final   Stenotrophomonas maltophilia NOT DETECTED NOT DETECTED Final   Candida albicans NOT DETECTED NOT DETECTED Final   Candida auris NOT DETECTED NOT DETECTED Final   Candida glabrata NOT DETECTED NOT DETECTED Final   Candida krusei NOT DETECTED NOT DETECTED Final   Candida parapsilosis NOT DETECTED NOT DETECTED Final   Candida tropicalis NOT DETECTED NOT DETECTED Final   Cryptococcus neoformans/gattii NOT DETECTED NOT DETECTED Final    Comment: Performed at [facility], [address], [place], [place] [address]  Blood Culture (routine x 2)     Status: None (Preliminary result)   Collection Time: 03/19/22 10:23 PM   Specimen: BLOOD  Result Value Ref Range Status   Specimen Description BLOOD BLOOD RIGHT FOREARM  Final   Special Requests   Final    BOTTLES DRAWN AEROBIC AND ANAEROBIC Blood Culture adequate volume   Culture  Setup Time   Final    GRAM POSITIVE COCCI IN BOTH AEROBIC AND ANAEROBIC BOTTLES CRITICAL RESULT CALLED TO, READ BACK BY AND VERIFIED WITH: ELVERA MADUEME 03/20/22 1545 AMK Performed at [facility], [address] [place], [place] [address]    Culture GRAM POSITIVE COCCI  Final   Report Status PENDING  Incomplete  Urine Culture     Status: None (Preliminary result)   Collection Time: 03/20/22  1:25 AM   Specimen: In/Out Cath Urine  Result Value Ref Range Status   Specimen Description   Final    IN/OUT CATH URINE Performed at [facility], [address], [place], [place] [address]    Special Requests   Final    NONE Performed at [facility], [address] [place], [place] [address]    Culture   Final    CULTURE REINCUBATED FOR BETTER GROWTH Performed at [facility], [place] [address]  [place],  [address]    Report Status PENDING  Incomplete         Radiology Studies: US Venous Img Lower Unilateral Left (DVT)  Result Date: 03/20/2022 CLINICAL DATA:  71 year old female with history of pulmonary embolism. EXAM: LEFT LOWER EXTREMITY VENOUS DOPPLER ULTRASOUND TECHNIQUE: Gray-scale sonography with compression, as well as color and duplex ultrasound, were performed to evaluate the deep venous system(s) from the level of the common femoral vein through the popliteal and proximal calf veins. COMPARISON:  No priors. FINDINGS: VENOUS Normal compressibility of the common femoral, superficial femoral, and popliteal veins, as well as the visualized calf veins. Visualized portions of profunda femoral vein and great saphenous vein unremarkable. No filling defects to suggest DVT on grayscale or color Doppler imaging. Doppler waveforms show normal direction of venous flow, normal respiratory plasticity and response to augmentation. Limited views of the contralateral common femoral vein are unremarkable. OTHER None. Limitations: none IMPRESSION: Negative. Electronically Signed   By: Vinnie Langton M.D.   On: 03/20/2022 05:04   US Venous Img Lower Unilateral Right  Result Date: 03/20/2022 CLINICAL DATA:  Leg swelling and calf pain EXAM: RIGHT LOWER EXTREMITY VENOUS DOPPLER ULTRASOUND TECHNIQUE: Gray-scale sonography with compression, as well as color and duplex ultrasound, were performed to evaluate the deep venous system(s) from the level of the common femoral vein through the popliteal and proximal calf veins. COMPARISON:  None Available. FINDINGS: VENOUS Normal compressibility of the common femoral, superficial femoral, and popliteal veins, as well as the visualized calf veins. Visualized portions of profunda femoral vein and great saphenous vein unremarkable. No filling defects to suggest DVT on grayscale or color Doppler imaging. Doppler waveforms show normal direction of venous flow, normal  respiratory plasticity and response to augmentation. Limited views of the contralateral common femoral vein are unremarkable. OTHER None. Limitations: none IMPRESSION: Negative. Electronically Signed   By: Julian Hy M.D.   On: 03/20/2022 00:19   CT Angio Chest PE W and/or Wo Contrast  Result Date: 03/20/2022 CLINICAL DATA:  Pulmonary embolism (PE) suspected, high prob EXAM: CT ANGIOGRAPHY CHEST WITH CONTRAST TECHNIQUE: Multidetector CT imaging of the chest was performed using the  standard protocol during bolus administration of intravenous contrast. Multiplanar CT image reconstructions and MIPs were obtained to evaluate the vascular anatomy. RADIATION DOSE REDUCTION: This exam was performed according to the departmental dose-optimization program which includes automated exposure control, adjustment of the mA and/or kV according to patient size and/or use of iterative reconstruction technique. CONTRAST:  169m OMNIPAQUE IOHEXOL 350 MG/ML SOLN COMPARISON:  12/18/2013 FINDINGS: Cardiovascular: Filling defects within posterior right lower lobe pulmonary arterial branches compatible with pulmonary emboli. Small pulmonary emboli also seen in the left lower lobe and right upper lobe. Mild cardiomegaly. Diffuse coronary artery disease. Aortic atherosclerosis. No aneurysm. Mediastinum/Nodes: No mediastinal, hilar, or axillary adenopathy. Trachea and esophagus are unremarkable. Thyroid unremarkable. Lungs/Pleura: Bibasilar opacities, likely atelectasis. No effusions. Upper Abdomen: No acute findings Musculoskeletal: Large soft tissue mass partially visualized in the left breast with gas throughout the adjacent soft tissues. Question recent surgical procedure. No acute bony abnormality. Review of the MIP images confirms the above findings. IMPRESSION: Small pulmonary emboli within both lower lobes and the right upper lobe. No evidence of right heart strain. Cardiomegaly, coronary artery disease. Bibasilar  atelectasis. Large masslike area in the left breast with surrounding soft tissue gas. Question recent surgery in the left breast. Recommend correlation with history and mammography. Aortic Atherosclerosis (ICD10-I70.0). Electronically Signed   By: KRolm BaptiseM.D.   On: 03/20/2022 00:05   DG Chest Port 1 View  Result Date: 03/19/2022 CLINICAL DATA:  Possible sepsis EXAM: PORTABLE CHEST 1 VIEW COMPARISON:  01/26/2022 FINDINGS: Cardiac shadow is enlarged in size but stable. Aortic calcifications are seen. Loop recorder and right chest wall port are again seen. Increasing central vascular congestion is noted without significant edema. No focal infiltrate or effusion is noted. No bony abnormality is seen. IMPRESSION: Increasing vascular congestion without significant edema. Electronically Signed   By: MInez CatalinaM.D.   On: 03/19/2022 22:34        Scheduled Meds:  aspirin EC  81 mg Oral Daily   atorvastatin  80 mg Oral QPM   calcium acetate  2,001 mg Oral TID WC   vitamin D3  1,000 Units Oral Daily   cinacalcet  30 mg Oral Q breakfast   clopidogrel  75 mg Oral Daily   fluticasone  1 spray Each Nare Daily   gabapentin  100 mg Oral BID   heparin  5,500 Units Intravenous Once   insulin aspart  0-6 Units Subcutaneous TID WC   insulin aspart protamine- aspart  12 Units Subcutaneous BID WC   ipratropium-albuterol  3 mL Nebulization BID   levETIRAcetam  1,000 mg Oral Daily   levETIRAcetam  250 mg Oral Once per day on Mon Wed Fri   losartan  100 mg Oral QHS   metoprolol succinate  100 mg Oral Q breakfast   pantoprazole  40 mg Oral Daily   torsemide  100 mg Oral Q breakfast   Continuous Infusions:  sodium chloride 10 mL/hr at 03/21/22 0651   cefTRIAXone (ROCEPHIN)  IV Stopped (03/20/22 1032)   doxycycline (VIBRAMYCIN) IV Stopped (03/21/22 07903   heparin 1,000 Units/hr (03/21/22 0651)     LOS: 1 day    Time spent: 37 mins     JWyvonnia Dusky MD [facility] Pager [phone]  [phone]  If 7PM-7AM, please contact night-coverage [email or web address] 03/21/2022, 8:22 AM

## 2022-03-21 NOTE — Progress Notes (Addendum)
ANTICOAGULATION CONSULT NOTE  Pharmacy Consult for Heparin Infusion Indication: pulmonary embolus  Patient Measurements: Height: '5\' 9"'$  (175.3 cm) Weight: 115.2 kg (254 lb) IBW/kg (Calculated) : 66.2 Heparin Dosing Weight: 92.5 kg  Labs: Recent Labs    03/19/22 2127 03/20/22 0448 03/20/22 0915 03/20/22 1901 03/21/22 0655 03/21/22 1526  HGB 8.1* 6.6*  --  6.9* 7.1*  --   HCT 26.1* 21.8*  --  22.3* 22.7*  --   PLT 214 163  --   --  168  --   APTT 32  --   --   --   --   --   LABPROT 14.6  --   --   --   --   --   INR 1.2  --   --   --   --   --   HEPARINUNFRC  --   --    < > >1.10* 0.10* 0.16*  CREATININE 11.43* 9.97*  --   --  12.47*  --    < > = values in this interval not displayed.     Estimated Creatinine Clearance: 5.6 mL/min (A) (by C-G formula based on SCr of 12.47 mg/dL (H)).   Medical History: Past Medical History:  Diagnosis Date   Acute on chronic respiratory failure with hypoxia ([place]) 07/30/2019   Acute respiratory failure with hypoxia ([place]) 12/31/2018   Carotid arterial disease ([place])    a. 02/2018 < bilat ICA stenosis.   COPD (chronic obstructive pulmonary disease) (HCC)    Coronary artery disease    a. 11/2015 MV: EF 57%, no ischemia/infarct.   Cryptogenic stroke ([place])    a. 10/2017 s/p implantable loop recorder (no Afib to date).   Diabetes mellitus without complication ([place])    Diarrhea    ESRD on dialysis [facility])    GI bleed    a. 01/2019 EGD/Colonoscopy: Mild gastritis. Colitis.   Heart murmur    a. 12/2018 Echo: EF 55-60%, impaired relaxation. Mod dil LA. Mildly dil RA. Mild Ca2+ of AoV.   Hematemesis 06/27/2017   History of 2019 novel coronavirus disease (COVID-19) 07/30/2019   Hyperkalemia    Hyperlipidemia    Hypertension    Intractable nausea and vomiting 08/08/2015   Nausea & vomiting 05/30/2018   Nausea and vomiting 05/29/2018   Pneumonia due to COVID-19 virus     Medications:  Pt given initial dose of heparin 5000 units 8/27 @  0045  Assessment: Pt is a 71 yo female w/ ESRD on HD presenting to ED c/o increase weakness & fatigue, found w/ small pulmonary emboli within both lower lobes and the right upper lobe.  8/27 0915 HL > 1.1 8/27 1901 HL > 1.1 8/28 0655 HL <0.10 8/28 1526 HL 0.16  Goal of Therapy:  Heparin level 0.3-0.7 units/ml Monitor platelets by anticoagulation protocol: Yes   Plan:  --Heparin level is subtherapeutic --Heparin 2700 unit IV bolus and increase heparin infusion rate to 1200 units/hr (previously supratherapeutic on 1300 units/hr) --Re-check HL 8 hours after rate change --Daily CBC per protocol while on IV heparin  Benita Gutter 03/21/2022 4:05 PM

## 2022-03-21 NOTE — Evaluation (Signed)
Occupational Therapy Evaluation Patient Details Name: Cynthia Dean MRN: [id number] DOB: [birth year] Today's Date: 03/21/2022   History of Present Illness Cynthia Dean is a 70 y.o. African-American female with medical history significant for COPD, carotid stenosis, type 2 diabetes mellitus, end-stage renal disease on hemodialysis on MWF, hypertension and dyslipidemia, as well as invasive ductal carcinoma of the left breast status post surgical excision at [facility] recently, who presented to the emergency room with acute onset of generalized weakness and fatigue for the last couple of days.   Clinical Impression   Cynthia Dean was seen for OT evaluation this date. Prior to hospital admission, pt was MOD I for limited mobility, reports PRN assist for stairs. Pt lives with boyfriend and daughter in home with 5 STE. Pt presents to acute OT demonstrating impaired ADL performance and functional mobility 2/2 decreased activity tolerance and functional strength/ROM/balance deficits. Pt currently requires SUPERVISION bed mobility, SBA don/doff B socks in sitting. MOD A + RW sit<>stand x2 trials, step by step cues for hand placement and 2 shuffling steps along EOB. Pt would benefit from skilled OT to address noted impairments and functional limitations (see below for any additional details). Upon hospital discharge, recommend STR to maximize pt safety and return to PLOF.   Recommendations for follow up therapy are one component of a multi-disciplinary discharge planning process, led by the attending physician.  Recommendations may be updated based on patient status, additional functional criteria and insurance authorization.   Follow Up Recommendations  Skilled nursing-short term rehab (<3 hours/day)    Assistance Recommended at Discharge Frequent or constant Supervision/Assistance  Patient can return home with the following Two people to help with walking and/or transfers;Two people to help with  bathing/dressing/bathroom    Functional Status Assessment  Patient has had a recent decline in their functional status and demonstrates the ability to make significant improvements in function in a reasonable and predictable amount of time.  Equipment Recommendations  Other (comment) (defer)    Recommendations for Other Services       Precautions / Restrictions Precautions Precautions: Fall Restrictions Weight Bearing Restrictions: No      Mobility Bed Mobility Overal bed mobility: Needs Assistance Bed Mobility: Supine to Sit     Supine to sit: HOB elevated, Supervision          Transfers Overall transfer level: Needs assistance Equipment used: Rolling walker (2 wheels) Transfers: Sit to/from Stand Sit to Stand: Min assist           General transfer comment: x2 trials from regular bed height, difficulty sequencing      Balance Overall balance assessment: Needs assistance Sitting-balance support: Bilateral upper extremity supported, Feet supported Sitting balance-Leahy Scale: Good     Standing balance support: During functional activity, Reliant on assistive device for balance Standing balance-Leahy Scale: Poor                             ADL either performed or assessed with clinical judgement   ADL Overall ADL's : Needs assistance/impaired                                       General ADL Comments: SBA don/doff B socks in sitting. MOD A + RW simulated BSC t/f      Pertinent Vitals/Pain Pain Assessment Pain Assessment: Faces Faces Pain Scale: Hurts little  more Pain Location: L flank Pain Descriptors / Indicators: Discomfort, Dull Pain Intervention(s): Limited activity within patient's tolerance, Repositioned     Hand Dominance     Extremity/Trunk Assessment Upper Extremity Assessment Upper Extremity Assessment: Generalized weakness   Lower Extremity Assessment Lower Extremity Assessment: Generalized weakness    Cervical / Trunk Assessment Cervical / Trunk Assessment: Normal   Communication Communication Communication: No difficulties   Cognition Arousal/Alertness: Awake/alert Behavior During Therapy: WFL for tasks assessed/performed Overall Cognitive Status: Difficult to assess                                 General Comments: difficulty with word finding, loses train of thought mid sentence. Increased time following one step commands.      Home Living Family/patient expects to be discharged to:: Private residence Living Arrangements: Spouse/significant other;Children Available Help at Discharge: Family;Available PRN/intermittently Type of Home: House Home Access: Stairs to enter [employer] of Steps: 5 Entrance Stairs-Rails: Right Home Layout: One level     Bathroom Shower/Tub: Teacher, early years/pre: Standard Bathroom Accessibility: Yes   Home Equipment: Conservation officer, nature (2 wheels)   Additional Comments: lives with boyfriend and daughter      Prior Functioning/Environment Prior Level of Function : Needs assist       Physical Assist : ADLs (physical)   ADLs (physical): Grooming;Bathing;Dressing;Toileting Mobility Comments: PRN assist for stairs ADLs Comments: PRN assist for LBD        OT Problem List: Decreased strength;Decreased range of motion;Decreased activity tolerance;Impaired balance (sitting and/or standing);Decreased safety awareness      OT Treatment/Interventions: Self-care/ADL training;Therapeutic exercise;Energy conservation;DME and/or AE instruction;Therapeutic activities;Patient/family education;Balance training    OT Goals(Current goals can be found in the care plan section) Acute Rehab OT Goals Patient Stated Goal: to go home OT Goal Formulation: With patient Time For Goal Achievement: 04/04/22 Potential to Achieve Goals: Good ADL Goals Pt Will Perform Grooming: with supervision;standing Pt Will Perform Lower  Body Dressing: sit to/from stand;with min guard assist Pt Will Transfer to Toilet: with modified independence;ambulating;bedside commode  OT Frequency: Min 2X/week    Co-evaluation              AM-PAC OT "6 Clicks" Daily Activity     Outcome Measure Help from another person eating meals?: None Help from another person taking care of personal grooming?: A Lot Help from another person toileting, which includes using toliet, bedpan, or urinal?: A Lot Help from another person bathing (including washing, rinsing, drying)?: A Lot Help from another person to put on and taking off regular upper body clothing?: A Little Help from another person to put on and taking off regular lower body clothing?: A Little 6 Click Score: 16   End of Session Equipment Utilized During Treatment: Rolling walker (2 wheels) Nurse Communication: Mobility status  Activity Tolerance: Patient tolerated treatment well Patient left: in bed;with call bell/phone within reach  OT Visit Diagnosis: Other abnormalities of gait and mobility (R26.89);Muscle weakness (generalized) (M62.81)                Time: 4944-9675 OT Time Calculation (min): 16 min Charges:  OT General Charges $OT Visit: 1 Visit OT Evaluation $OT Eval Moderate Complexity: 1 Mod  Dessie Coma, M.S. OTR/L  03/21/22, 1:26 PM  ascom [phone]

## 2022-03-21 NOTE — Progress Notes (Signed)
Patient will receive scheduled dialysis treatment today, orders placed.  Will provide formal consult tomorrow.

## 2022-03-21 NOTE — Evaluation (Signed)
Physical Therapy Evaluation Patient Details Name: Cynthia Dean MRN: [id number] DOB: 12/13/1950 Today's Date: 03/21/2022  History of Present Illness  Cynthia Dean is a 71 y.o. African-American female with medical history significant for COPD, carotid stenosis, type 2 diabetes mellitus, end-stage renal disease on hemodialysis on MWF, hypertension and dyslipidemia, as well as invasive ductal carcinoma of the left breast status post surgical excision at [facility] recently, who presented to the emergency room with acute onset of generalized weakness and fatigue for the last couple of days.   Clinical Impression  Pt admitted with above diagnosis. Pt received upright in bed agreeable to PT services. Resting on 2L/min via [place] with all VSS. Pt reports accurate home set up and PLOF compared to previous documented per EMR. At baseline relies on significant other for transportation and ADL's/IADL's. Utilizes RW at baseline in household.   To date, pt relies on supervision, increased time, and bed features to sit EOB. Able to stand from lowered surface to RW with minA and elevated bed with minguard but reliant on max VC's for hand placements for 2 STS reps at EOB.  Pt displaying difficulty following verbal commands reliant on PT demo and hand over hand cues with visual cues for successful completion. Pt able to side step ~3 feet to [facility] limited in progression of further mobility due to lines/leads and LE fatigue/buckling post side steps. Pt emotional and unable to express due to current situation, fear of falling, etc. Returned to supine in bed with minA at LE's. Pt is below reported PLOF and is at increased risk of falls currently due to LE weakness. Significant other works at school system and reports from pt that he will be busy returning back to work seeming like reduce caregiver support would be likely at home. All needs in reach. Pt currently with functional limitations due to the deficits listed below  (see PT Problem List). Pt will benefit from skilled PT to increase their independence and safety with mobility to allow discharge to the venue listed below.     Recommendations for follow up therapy are one component of a multi-disciplinary discharge planning process, led by the attending physician.  Recommendations may be updated based on patient status, additional functional criteria and insurance authorization.  Follow Up Recommendations Skilled nursing-short term rehab (<3 hours/day) Can patient physically be transported by private vehicle: No    Assistance Recommended at Discharge Frequent or constant Supervision/Assistance  Patient can return home with the following  A little help with walking and/or transfers;A little help with bathing/dressing/bathroom;Help with stairs or ramp for entrance;Assist for transportation    Equipment Recommendations Other (comment) (tbd by next venue of care)  Recommendations for Other Services       Functional Status Assessment Patient has had a recent decline in their functional status and demonstrates the ability to make significant improvements in function in a reasonable and predictable amount of time.     Precautions / Restrictions Precautions Precautions: Fall Restrictions Weight Bearing Restrictions: No      Mobility  Bed Mobility Overal bed mobility: Needs Assistance Bed Mobility: Supine to Sit, Sit to Supine     Supine to sit: HOB elevated, Supervision Sit to supine: Min assist   General bed mobility comments: minA at LE's to return to bed. Increased time for entering/exiting bed with need for bed features. Patient Response: Cooperative  Transfers Overall transfer level: Needs assistance Equipment used: Rolling walker (2 wheels) Transfers: Sit to/from Stand Sit to Stand: Min assist,  Min guard, From elevated surface           General transfer comment: minguard from elevated surface, minA from lowered bed. Max VC's for hand  placement    Ambulation/Gait Ambulation/Gait assistance: Min Web designer (Feet): 3 Feet Assistive device: Rolling walker (2 wheels) Gait Pattern/deviations: Step-to pattern       General Gait Details: R sided steps steps reliant on hand over hand assist on RW for sequencing LE's with RW. Limited due to lines/leads and weakness to progress gait.  Stairs            Wheelchair Mobility    Modified Rankin (Stroke Patients Only)       Balance Overall balance assessment: Needs assistance Sitting-balance support: Bilateral upper extremity supported, Feet supported Sitting balance-Leahy Scale: Fair     Standing balance support: During functional activity, Reliant on assistive device for balance Standing balance-Leahy Scale: Poor Standing balance comment: Heavy BUE support on RW                             Pertinent Vitals/Pain Pain Assessment Pain Assessment: No/denies pain    Home Living Family/patient expects to be discharged to:: Private residence Living Arrangements: Spouse/significant other Available Help at Discharge: Family;Available PRN/intermittently Type of Home: House Home Access: Stairs to enter Entrance Stairs-Rails: Right Entrance Stairs-Number of Steps: 3-4   Home Layout: One level Home Equipment: Rolling Walker (2 wheels) Additional Comments: Pt reports overall accurate home set up from prior EMR. States maintains using RW at baseline and her boyfriend assists with ADL's/IADL's.    Prior Function Prior Level of Function : Needs assist       Physical Assist : ADLs (physical)   ADLs (physical): Grooming;Bathing;Dressing;Toileting   ADLs Comments: per pt, states boyfriend assists with these tasks but does not specify how much assist is needed.     Hand Dominance        Extremity/Trunk Assessment   Upper Extremity Assessment Upper Extremity Assessment: Defer to OT evaluation    Lower Extremity Assessment Lower  Extremity Assessment: Generalized weakness    Cervical / Trunk Assessment Cervical / Trunk Assessment: Normal  Communication   Communication: Expressive difficulties  Cognition Arousal/Alertness: Awake/alert Behavior During Therapy: WFL for tasks assessed/performed Overall Cognitive Status: Difficult to assess                                 General Comments: difficulty with word finding, loses train of thought mid sentence, perseverates on lines/leads. Difficulty following single and two step commands.        General Comments      Exercises Other Exercises Other Exercises: Role of PT in acute setting, d/c recs, safe use of DME to reduce falls risk and for energy conservation   Assessment/Plan    PT Assessment Patient needs continued PT services  PT Problem List Decreased strength;Decreased mobility;Decreased activity tolerance;Decreased balance;Decreased knowledge of use of DME       PT Treatment Interventions DME instruction;Therapeutic exercise;Gait training;Balance training;Stair training;Neuromuscular re-education;Functional mobility training;Therapeutic activities;Patient/family education    PT Goals (Current goals can be found in the Care Plan section)  Acute Rehab PT Goals Patient Stated Goal: improve strength and feel better PT Goal Formulation: With patient Time For Goal Achievement: 04/04/22 Potential to Achieve Goals: Fair    Frequency Min 2X/week     Co-evaluation  AM-PAC PT "6 Clicks" Mobility  Outcome Measure Help needed turning from your back to your side while in a flat bed without using bedrails?: A Little Help needed moving from lying on your back to sitting on the side of a flat bed without using bedrails?: A Lot Help needed moving to and from a bed to a chair (including a wheelchair)?: A Lot Help needed standing up from a chair using your arms (e.g., wheelchair or bedside chair)?: A Little Help needed to walk in  hospital room?: A Lot Help needed climbing 3-5 steps with a railing? : Total 6 Click Score: 13    End of Session Equipment Utilized During Treatment: Gait belt;Oxygen Activity Tolerance: Patient limited by fatigue Patient left: in bed;with call bell/phone within reach;with bed alarm set Nurse Communication: Mobility status PT Visit Diagnosis: Other abnormalities of gait and mobility (R26.89);Muscle weakness (generalized) (M62.81);Difficulty in walking, not elsewhere classified (R26.2)    Time: 6067-7034 PT Time Calculation (min) (ACUTE ONLY): 25 min   Charges:   PT Evaluation $PT Eval Moderate Complexity: 1 Mod PT Treatments $Therapeutic Activity: 8-22 mins        Maziyah Vessel M. Fairly IV, PT, DPT Physical Therapist- [facility]  03/21/2022, 10:27 AM

## 2022-03-21 NOTE — Progress Notes (Signed)
Pre HD RN assessment 

## 2022-03-21 NOTE — Progress Notes (Signed)
Hd tx of 3.5hrs completed. 83.7L total vol processed. No complications. Report given to marcel turner, rn.  Total uf removed: 2049m Post hd wt: 123kgs Post hd v/s: 98.8 155/73(96) 67 18 100%

## 2022-03-21 NOTE — ED Notes (Signed)
Fsbs 152

## 2022-03-22 ENCOUNTER — Telehealth (HOSPITAL_COMMUNITY): Payer: Self-pay | Admitting: Pharmacy Technician

## 2022-03-22 ENCOUNTER — Other Ambulatory Visit (HOSPITAL_COMMUNITY): Payer: Self-pay

## 2022-03-22 ENCOUNTER — Inpatient Hospital Stay
Admit: 2022-03-22 | Discharge: 2022-03-22 | Disposition: A | Discharge status: Home / Self Care | Payer: Medicare Other | Attending: Family Medicine | Admitting: Family Medicine

## 2022-03-22 DIAGNOSIS — N186 End stage renal disease: Secondary | ICD-10-CM | POA: Diagnosis not present

## 2022-03-22 DIAGNOSIS — D638 Anemia in other chronic diseases classified elsewhere: Secondary | ICD-10-CM

## 2022-03-22 DIAGNOSIS — N390 Urinary tract infection, site not specified: Secondary | ICD-10-CM

## 2022-03-22 DIAGNOSIS — K5909 Other constipation: Secondary | ICD-10-CM

## 2022-03-22 DIAGNOSIS — I2699 Other pulmonary embolism without acute cor pulmonale: Secondary | ICD-10-CM | POA: Diagnosis not present

## 2022-03-22 LAB — BASIC METABOLIC PANEL
Anion gap: 12 (ref 5–15)
BUN: 32 mg/dL — ABNORMAL HIGH (ref 8–23)
CO2: 26 mmol/L (ref 22–32)
Calcium: 8.7 mg/dL — ABNORMAL LOW (ref 8.9–10.3)
Chloride: 98 mmol/L (ref 98–111)
Creatinine, Ser: 7.57 mg/dL — ABNORMAL HIGH (ref 0.44–1.00)
GFR, Estimated: 5 mL/min — ABNORMAL LOW (ref >60)
Glucose, Bld: 91 mg/dL (ref 70–99)
Potassium: 3.6 mmol/L (ref 3.5–5.1)
Sodium: 136 mmol/L (ref 135–145)

## 2022-03-22 LAB — CULTURE, BLOOD (ROUTINE X 2): Special Requests: ADEQUATE

## 2022-03-22 LAB — URINE CULTURE: Culture: 4000 — AB

## 2022-03-22 LAB — CBC
HCT: 25.3 % — ABNORMAL LOW (ref 36.0–46.0)
Hemoglobin: 8 g/dL — ABNORMAL LOW (ref 12.0–15.0)
MCH: 27.4 pg (ref 26.0–34.0)
MCHC: 31.6 g/dL (ref 30.0–36.0)
MCV: 86.6 fL (ref 80.0–100.0)
Platelets: 210 10*3/uL (ref 150–400)
RBC: 2.92 MIL/uL — ABNORMAL LOW (ref 3.87–5.11)
RDW: 16.7 % — ABNORMAL HIGH (ref 11.5–15.5)
WBC: 9.7 10*3/uL (ref 4.0–10.5)
nRBC: 0 % (ref 0.0–0.2)

## 2022-03-22 LAB — HEPARIN LEVEL (UNFRACTIONATED)
Heparin Unfractionated: 0.2 IU/mL — ABNORMAL LOW (ref 0.30–0.70)
Heparin Unfractionated: <0.1 IU/mL — ABNORMAL LOW (ref 0.30–0.70)

## 2022-03-22 LAB — GLUCOSE, CAPILLARY
Glucose-Capillary: 101 mg/dL — ABNORMAL HIGH (ref 70–99)
Glucose-Capillary: 148 mg/dL — ABNORMAL HIGH (ref 70–99)
Glucose-Capillary: 133 mg/dL — ABNORMAL HIGH (ref 70–99)
Glucose-Capillary: 104 mg/dL — ABNORMAL HIGH (ref 70–99)

## 2022-03-22 LAB — HEPATITIS B SURFACE ANTIBODY, QUANTITATIVE: Hep B S AB Quant (Post): <3.1 m[IU]/mL — ABNORMAL LOW (ref >9.9)

## 2022-03-22 MED ORDER — APIXABAN 5 MG PO TABS: 5.0000 mg | ORAL_TABLET | Freq: Two times a day (BID) | ORAL | Status: DC

## 2022-03-22 MED ORDER — HEPARIN BOLUS VIA INFUSION
1400.0000 [IU] | Freq: Once | INTRAVENOUS | Status: AC
  Administered 2022-03-22: 1400 [IU] via INTRAVENOUS
  Filled 2022-03-22: qty 1400

## 2022-03-22 MED ORDER — APIXABAN 5 MG PO TABS
10.0000 mg | ORAL_TABLET | Freq: Two times a day (BID) | ORAL | Status: DC
  Administered 2022-03-22 – 2022-03-24 (×5): 10 mg via ORAL
  Filled 2022-03-22 (×5): qty 2

## 2022-03-22 NOTE — Progress Notes (Addendum)
PROGRESS NOTE   HPI was taken from Dr. Sidney Ace: Cynthia Dean is a 71 y.o. African-American female with medical history significant for COPD, carotid stenosis, type 2 diabetes mellitus, end-stage renal disease on hemodialysis on MWF, hypertension and dyslipidemia, as well as invasive ductal carcinoma of the left breast status post surgical excision at [facility] recently, who presented to the emergency room with acute onset of generalized weakness and fatigue for the last couple of days.  She missed hemodialysis session yesterday.  She was fairly confused in the ER.  She has been having dyspnea with mild cough and occasional wheezing.  She admitted to fever and chills.  No chest pain or palpitations.  No nausea or vomiting or diarrhea or abdominal pain.  No bleeding diathesis.   ED Course: Upon presentation to the emergency room temperature was 102.9 with BP 139/57 heart rate 50 with respiratory to 23  with pulse oximetry of 87% on room air and 98% on 4 L of O2 by nasal cannula.  Labs revealed leukocytosis 16.5 with neutrophilia and hemoglobin of 8.1 with hematocrit 26.1 with platelets of 214.  Influenza antigens and COVID-19 PCR came back negative.  Blood cultures were drawn. EKG as reviewed by me : EKG showed sinus rhythm with rate of 81 with T wave inversion laterally. Imaging: Portable chest x-ray showed increasing vascular congestion without pulmonary edema.  Chest CTA revealed small pulmonary emboli within both lower lobes and the right upper lobe with no evidence for right heart strain.  It showed cardiomegaly and coronary artery disease with bibasal atelectasis and large masslike area in the left breast with surrounding soft tissue gas that corresponds with recent left lumpectomy.   The patient was given IV cefepime and heparin bolus and drip as well as 500 mL IV normal saline.  She will be admitted to a progressive unit bed for further evaluation and management.  As per Dr. Jimmye Norman  8/27-8/29/23: Pt presented w/ shortness of breath and was found to have acute pulmonary embolism w/o right heart strain. Pt was initially put on IV heparin drip and then changed to po eliquis. Echo is pending. Also, pt was admitted for mild breast cellulitis and has been on IV rocephin. Urine cx was growing staph epidermis and blood cxs growing stap captis for which pt has been on IV doxycycline. The blood cxs likely represent a containment. Repeat blood cxs NGTD. PT recs SNF.    Adalaide Jaskolski  FVC:[id number] DOB: [birth year] DOA: 03/19/2022 PCP: Lowella Bandy, MD   Assessment & Plan:   Principal Problem:   Acute pulmonary embolism without acute cor pulmonale (HCC) Active Problems:   Sepsis due to cellulitis ([place])   ESRD (end stage renal disease) ([place])   Essential hypertension   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[place])   Dyslipidemia   Diabetic neuropathy (HCC)   Urinary tract infection   Anemia of chronic disease  Assessment and Plan: Acute pulmonary embolism: without acute cor pulmonale. D/c IV heparin and start eliquis. Echo is pending. Holding home dose of aspirin, plavix  ACD: likely secondary to ESRD.  W/ possible component of acute blood loss anemia. S/p 1 unit of pRBCs transfused so far. H&H are trending up    Sepsis: see Dr. Randel Books note on how pt met sepsis criteria. Likely secondary to left breast cellulitis. Continue on IV rocephin. Procal 3.92. Sepsis resolved  Mild cellulitis of left breast: continue on IV rocephin.    Unlikely bacteremia: blood cxs  growing staph capitis, likely a containment. Repeat blood cxs NGTD  Possible UTI: urine cx staph epidermidis. Continue on IV doxy    ESRD: on HD MWF. Missed HD on Friday. Nephro following and recs apprec  Constipation: continue on colace, miralax   Breast cancer: recent dx. S/p surgical excision at [facility] & chemo. High risk of blood clots w/ hx of cancer    Diabetic neuropathy: continue  on home dose of gabapentin    HLD: continue on statin    DM2: likely poorly controlled. HbA1c ordered for AM. Continue on 70/30, SSI w/ accuchecks    HTN: continue on metoprolol, losartan           DVT prophylaxis: eliquis  Code Status:  full  Family Communication: called pt's daughter, [name], but no answer and unable to leave a voicemail  Disposition Plan: depends on PT/OT recs  Level of care: Progressive  Status is: Inpatient Remains inpatient appropriate because: severity of illness    Consultants:  Nephro   Procedures:  Antimicrobials: rocephin, doxycycline    Subjective: Pt c/o constipation  Objective: Vitals:   03/22/22 0448 03/22/22 0754 03/22/22 0804 03/22/22 1139  BP:   131/80 (!) 141/51  Pulse:   67 68  Resp:   18 18  Temp:   98.2 F (36.8 C) 98.1 F (36.7 C)  TempSrc:   Oral Oral  SpO2:  97% 100% 98%  Weight: 116.3 kg     Height:        Intake/Output Summary (Last 24 hours) at 03/22/2022 1359 Last data filed at 03/22/2022 1141 Gross per 24 hour  Intake --  Output 2000 ml  Net -2000 ml   Filed Weights   03/21/22 1859 03/21/22 2239 03/22/22 0448  Weight: 125 kg 123 kg 116.3 kg    Examination:  General exam: Appears calm but uncomfortable  Respiratory system: diminished breath sounds b/l  Cardiovascular system: S1 & S2+. No rubs or clicks  Gastrointestinal system: Abd is soft, NT, obese & hypoactive bowel sounds Central nervous system: Alert and oriented. Moves all extremities  Psychiatry: Judgement and insight appears at baseline. Flat mood and affect     Data Reviewed: I have personally reviewed following labs and imaging studies  CBC: Recent Labs  Lab 03/19/22 2127 03/20/22 0448 03/20/22 1901 03/21/22 0655 03/21/22 1847 03/22/22 0156  WBC 16.5* 14.4*  --  9.6  --  9.7  NEUTROABS 13.0*  --   --   --   --   --   HGB 8.1* 6.6* 6.9* 7.1* 7.5* 8.0*  HCT 26.1* 21.8* 22.3* 22.7* 23.8* 25.3*  MCV 90.6 93.2  --  87.3  --   86.6  PLT 214 163  --  168  --  106   Basic Metabolic Panel: Recent Labs  Lab 03/19/22 2127 03/20/22 0448 03/21/22 0655 03/22/22 0156  NA 138 136 135 136  K 4.3 3.8 4.6 3.6  CL 100 109 101 98  CO2 24 21* 22 26  GLUCOSE 94 105* 84 91  BUN 48* 47* 60* 32*  CREATININE 11.43* 9.97* 12.47* 7.57*  CALCIUM 9.6 7.9* 8.7* 8.7*   GFR: Estimated Creatinine Clearance: 9.3 mL/min (A) (by C-G formula based on SCr of 7.57 mg/dL (H)). Liver Function Tests: Recent Labs  Lab 03/19/22 2127  AST 15  ALT 7  ALKPHOS 89  BILITOT 1.2  PROT 7.2  ALBUMIN 3.4*   No results for input(s): "LIPASE", "AMYLASE" in the last 168 hours. No results for input(s): "  AMMONIA" in the last 168 hours. Coagulation Profile: Recent Labs  Lab 03/19/22 2127  INR 1.2   Cardiac Enzymes: No results for input(s): "CKTOTAL", "CKMB", "CKMBINDEX", "TROPONINI" in the last 168 hours. BNP (last 3 results) No results for input(s): "PROBNP" in the last 8760 hours. HbA1C: No results for input(s): "HGBA1C" in the last 72 hours. CBG: Recent Labs  Lab 03/21/22 0826 03/21/22 1141 03/21/22 1641 03/22/22 0803 03/22/22 1140  GLUCAP 103* 154* 152* 101* 148*   Lipid Profile: No results for input(s): "CHOL", "HDL", "LDLCALC", "TRIG", "CHOLHDL", "LDLDIRECT" in the last 72 hours. Thyroid Function Tests: No results for input(s): "TSH", "T4TOTAL", "FREET4", "T3FREE", "THYROIDAB" in the last 72 hours. Anemia Panel: No results for input(s): "VITAMINB12", "FOLATE", "FERRITIN", "TIBC", "IRON", "RETICCTPCT" in the last 72 hours. Sepsis Labs: Recent Labs  Lab 03/19/22 2127 03/20/22 0026 03/20/22 0448  PROCALCITON  --   --  3.92  LATICACIDVEN 2.0* 1.1  --     Recent Results (from the past 240 hour(s))  Resp Panel by RT-PCR (Flu A&B, Covid) Anterior Nasal Swab     Status: None   Collection Time: 03/19/22  9:28 PM   Specimen: Anterior Nasal Swab  Result Value Ref Range Status   SARS Coronavirus 2 by RT PCR NEGATIVE  NEGATIVE Final    Comment: (NOTE) SARS-CoV-2 target nucleic acids are NOT DETECTED.  The SARS-CoV-2 RNA is generally detectable in upper respiratory specimens during the acute phase of infection. The lowest concentration of SARS-CoV-2 viral copies this assay can detect is 138 copies/mL. A negative result does not preclude SARS-Cov-2 infection and should not be used as the sole basis for treatment or other patient management decisions. A negative result may occur with  improper specimen collection/handling, submission of specimen other than nasopharyngeal swab, presence of viral mutation(s) within the areas targeted by this assay, and inadequate number of viral copies(<138 copies/mL). A negative result must be combined with clinical observations, patient history, and epidemiological information. The expected result is Negative.  Fact Sheet for Patients:  EntrepreneurPulse.com.au  Fact Sheet for Healthcare Providers:  IncredibleEmployment.be  This test is no t yet approved or cleared by the [place] FDA and  has been authorized for detection and/or diagnosis of SARS-CoV-2 by FDA under an Emergency Use Authorization (EUA). This EUA will remain  in effect (meaning this test can be used) for the duration of the COVID-19 declaration under Section 564(b)(1) of the Act, 21 U.S.C.section 360bbb-3(b)(1), unless the authorization is terminated  or revoked sooner.       Influenza A by PCR NEGATIVE NEGATIVE Final   Influenza B by PCR NEGATIVE NEGATIVE Final    Comment: (NOTE) The Xpert Xpress SARS-CoV-2/FLU/RSV plus assay is intended as an aid in the diagnosis of influenza from Nasopharyngeal swab specimens and should not be used as a sole basis for treatment. Nasal washings and aspirates are unacceptable for Xpert Xpress SARS-CoV-2/FLU/RSV testing.  Fact Sheet for Patients: EntrepreneurPulse.com.au  Fact Sheet for Healthcare  Providers: IncredibleEmployment.be  This test is not yet approved or cleared by the [place] FDA and has been authorized for detection and/or diagnosis of SARS-CoV-2 by FDA under an Emergency Use Authorization (EUA). This EUA will remain in effect (meaning this test can be used) for the duration of the COVID-19 declaration under Section 564(b)(1) of the Act, 21 U.S.C. section 360bbb-3(b)(1), unless the authorization is terminated or revoked.  Performed at [facility], [address] [place], [place] [address]   Blood Culture (routine x 2)  Status: Abnormal (Preliminary result)   Collection Time: 03/19/22  9:28 PM   Specimen: BLOOD  Result Value Ref Range Status   Specimen Description   Final    BLOOD RIGHT ANTECUBITAL Performed at [facility], [address] [place], [place] [address]    Special Requests   Final    BOTTLES DRAWN AEROBIC AND ANAEROBIC Blood Culture adequate volume Performed at [facility], [address] [place], [place] [address]    Culture  Setup Time   Final    GRAM POSITIVE COCCI IN BOTH AEROBIC AND ANAEROBIC BOTTLES CRITICAL RESULT CALLED TO, READ BACK BY AND VERIFIED WITH: CAROLYN CHILDS 03/20/22 1457 AMK Performed at [facility], [address], [place], [place] [address]    Culture STAPHYLOCOCCUS CAPITIS (A)  Final   Report Status PENDING  Incomplete   Organism ID, Bacteria STAPHYLOCOCCUS CAPITIS  Final      Susceptibility   Staphylococcus capitis - MIC*    CIPROFLOXACIN <=0.5 SENSITIVE Sensitive     ERYTHROMYCIN <=0.25 SENSITIVE Sensitive     GENTAMICIN <=0.5 SENSITIVE Sensitive     OXACILLIN <=0.25 SENSITIVE Sensitive     TETRACYCLINE <=1 SENSITIVE Sensitive     VANCOMYCIN <=0.5 SENSITIVE Sensitive     TRIMETH/SULFA <=10 SENSITIVE Sensitive     CLINDAMYCIN <=0.25 SENSITIVE Sensitive     RIFAMPIN <=0.5 SENSITIVE Sensitive     Inducible Clindamycin NEGATIVE Sensitive     *  STAPHYLOCOCCUS CAPITIS  Blood Culture ID Panel (Reflexed)     Status: Abnormal   Collection Time: 03/19/22  9:28 PM  Result Value Ref Range Status   Enterococcus faecalis NOT DETECTED NOT DETECTED Final   Enterococcus Faecium NOT DETECTED NOT DETECTED Final   Listeria monocytogenes NOT DETECTED NOT DETECTED Final   Staphylococcus species DETECTED (A) NOT DETECTED Final    Comment: CRITICAL RESULT CALLED TO, READ BACK BY AND VERIFIED WITH: CAROLYN CHILDS 03/20/22 1457 AMK    Staphylococcus aureus (BCID) NOT DETECTED NOT DETECTED Final   Staphylococcus epidermidis NOT DETECTED NOT DETECTED Final   Staphylococcus lugdunensis NOT DETECTED NOT DETECTED Final   Streptococcus species NOT DETECTED NOT DETECTED Final   Streptococcus agalactiae NOT DETECTED NOT DETECTED Final   Streptococcus pneumoniae NOT DETECTED NOT DETECTED Final   Streptococcus pyogenes NOT DETECTED NOT DETECTED Final   A.calcoaceticus-baumannii NOT DETECTED NOT DETECTED Final   Bacteroides fragilis NOT DETECTED NOT DETECTED Final   Enterobacterales NOT DETECTED NOT DETECTED Final   Enterobacter cloacae complex NOT DETECTED NOT DETECTED Final   Escherichia coli NOT DETECTED NOT DETECTED Final   Klebsiella aerogenes NOT DETECTED NOT DETECTED Final   Klebsiella oxytoca NOT DETECTED NOT DETECTED Final   Klebsiella pneumoniae NOT DETECTED NOT DETECTED Final   Proteus species NOT DETECTED NOT DETECTED Final   Salmonella species NOT DETECTED NOT DETECTED Final   Serratia marcescens NOT DETECTED NOT DETECTED Final   Haemophilus influenzae NOT DETECTED NOT DETECTED Final   Neisseria meningitidis NOT DETECTED NOT DETECTED Final   Pseudomonas aeruginosa NOT DETECTED NOT DETECTED Final   Stenotrophomonas maltophilia NOT DETECTED NOT DETECTED Final   Candida albicans NOT DETECTED NOT DETECTED Final   Candida auris NOT DETECTED NOT DETECTED Final   Candida glabrata NOT DETECTED NOT DETECTED Final   Candida krusei NOT DETECTED NOT  DETECTED Final   Candida parapsilosis NOT DETECTED NOT DETECTED Final   Candida tropicalis NOT DETECTED NOT DETECTED Final   Cryptococcus neoformans/gattii NOT DETECTED NOT DETECTED Final    Comment:  Performed at [facility], [address] [place], [place] [address]  Blood Culture (routine x 2)     Status: Abnormal   Collection Time: 03/19/22 10:23 PM   Specimen: BLOOD  Result Value Ref Range Status   Specimen Description   Final    BLOOD BLOOD RIGHT FOREARM Performed at [facility], [address], [place], [place] [address]    Special Requests   Final    BOTTLES DRAWN AEROBIC AND ANAEROBIC Blood Culture adequate volume Performed at [facility], [address] [place], [place] [address]    Culture  Setup Time   Final    GRAM POSITIVE COCCI IN BOTH AEROBIC AND ANAEROBIC BOTTLES CRITICAL RESULT CALLED TO, READ BACK BY AND VERIFIED WITH: ELVERA MADUEME 03/20/22 1545 AMK Performed at [facility], [address] [place], [place] [address]    Culture (A)  Final    STAPHYLOCOCCUS CAPITIS SUSCEPTIBILITIES PERFORMED ON PREVIOUS CULTURE WITHIN THE LAST 5 DAYS. Performed at [facility], [place] [address] [place], [place] [address]    Report Status 03/22/2022 FINAL  Final  Urine Culture     Status: Abnormal   Collection Time: 03/20/22  1:25 AM   Specimen: In/Out Cath Urine  Result Value Ref Range Status   Specimen Description   Final    IN/OUT CATH URINE Performed at [facility], [address] [place], [place] [address]    Special Requests   Final    NONE Performed at [facility], [address] [place], [place] [address]    Culture 4,000 COLONIES/mL STAPHYLOCOCCUS EPIDERMIDIS (A)  Final   Report Status 03/22/2022 FINAL  Final   Organism ID, Bacteria STAPHYLOCOCCUS EPIDERMIDIS (A)  Final      Susceptibility   Staphylococcus epidermidis - MIC*    CIPROFLOXACIN <=0.5 SENSITIVE Sensitive     GENTAMICIN  <=0.5 SENSITIVE Sensitive     NITROFURANTOIN <=16 SENSITIVE Sensitive     OXACILLIN <=0.25 SENSITIVE Sensitive     TETRACYCLINE <=1 SENSITIVE Sensitive     VANCOMYCIN 2 SENSITIVE Sensitive     TRIMETH/SULFA 80 RESISTANT Resistant     CLINDAMYCIN >=8 RESISTANT Resistant     RIFAMPIN <=0.5 SENSITIVE Sensitive     Inducible Clindamycin NEGATIVE Sensitive     * 4,000 COLONIES/mL STAPHYLOCOCCUS EPIDERMIDIS  Culture, blood (Routine X 2) w Reflex to ID Panel     Status: None (Preliminary result)   Collection Time: 03/21/22 11:00 AM   Specimen: BLOOD  Result Value Ref Range Status   Specimen Description BLOOD BLOOD RIGHT FOREARM  Final   Special Requests   Final    BOTTLES DRAWN AEROBIC AND ANAEROBIC Blood Culture adequate volume   Culture   Final    NO GROWTH < 24 HOURS Performed at [facility], [address], [place], [place] [address]    Report Status PENDING  Incomplete         Radiology Studies: No results found.      Scheduled Meds:  atorvastatin  80 mg Oral QPM   calcium acetate  2,001 mg Oral TID WC   Chlorhexidine Gluconate Cloth  6 each Topical Q0600   vitamin D3  1,000 Units Oral Daily   cinacalcet  30 mg Oral Q breakfast   docusate sodium  200 mg Oral BID   fluticasone  1 spray Each Nare Daily   gabapentin  100 mg Oral BID   insulin aspart  0-6 Units Subcutaneous TID WC  insulin aspart protamine- aspart  10 Units Subcutaneous BID WC   ipratropium-albuterol  3 mL Nebulization BID   levETIRAcetam  1,000 mg Oral Daily   levETIRAcetam  250 mg Oral Once per day on Mon Wed Fri   losartan  100 mg Oral QHS   metoprolol succinate  100 mg Oral Q breakfast   pantoprazole  40 mg Oral Daily   polyethylene glycol  17 g Oral Daily   torsemide  100 mg Oral Q breakfast   Continuous Infusions:  sodium chloride 10 mL/hr at 03/21/22 2820   anticoagulant sodium citrate     cefTRIAXone (ROCEPHIN)  IV Stopped (03/22/22 1152)   doxycycline (VIBRAMYCIN) IV 100 mg  (03/22/22 0541)   heparin 1,350 Units/hr (03/22/22 1215)     LOS: 2 days    Time spent: 35 mins     Wyvonnia Dusky, MD [facility] Pager [phone]  If 7PM-7AM, please contact night-coverage [email or web address] 03/22/2022, 1:59 PM

## 2022-03-22 NOTE — Progress Notes (Signed)
*  PRELIMINARY RESULTS* Echocardiogram 2D Echocardiogram has been performed.  Sherrie Sport 03/22/2022, 7:59 AM

## 2022-03-22 NOTE — Telephone Encounter (Signed)
Pharmacy Patient Advocate Encounter  Insurance verification completed.    The patient is insured through [employer] Medicare Part D   The patient is currently admitted and ran test claims for the following: Eliquis.  Copays and coinsurance results were relayed to Inpatient clinical team.  

## 2022-03-22 NOTE — NC FL2 (Signed)
[place] LEVEL OF CARE SCREENING TOOL     IDENTIFICATION  Patient Name: Cynthia Dean Birthdate: October 26, 1950 Sex: female Admission Date (Current Location): 03/19/2022  [facility] and [place] Number:  Engineering geologist and Address:  [facility], [address], [place], [place] [address]      Provider Number: [id number]  Attending Physician Name and Address:  Wyvonnia Dusky, MD  Relative Name and Phone Number:       Current Level of Care: Hospital Recommended Level of Care: [place] Prior Approval Number:    Date Approved/Denied:   PASRR Number: [id number]  Discharge Plan: SNF    Current Diagnoses: Patient Active Problem List   Diagnosis Date Noted   Acute pulmonary embolism without acute cor pulmonale ([place]) 03/20/2022   Dyslipidemia 03/20/2022   Diabetic neuropathy ([place]) 03/20/2022   Sepsis due to cellulitis ([place]) 03/20/2022   Delay kidney tx func d/t fluid overload requiring acute dialysis ([place]) 01/29/2020   Delay kidney tx func d/t ATN and fluid overload require acute dialysis ([place]) 01/29/2020   Hypoglycemia 10/21/2019   Unresponsiveness 10/21/2019   GERD (gastroesophageal reflux disease) 10/21/2019   Hypothermia    Sepsis with encephalopathy without septic shock (HCC)    Acute metabolic encephalopathy    Acute on chronic diastolic CHF (congestive heart failure) ([place])    Acute on chronic respiratory failure with hypoxia ([place]) 07/30/2019   Rectal bleed 02/13/2019   Acute respiratory failure with hypoxia ([place]) 12/31/2018   Aphasia as late effect of cerebrovascular accident (CVA) 05/01/2018   Seizure ([place]) 03/14/2018   Stroke ([place]) 03/07/2018   Slurred speech 11/14/2017   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[place]) 11/12/2017   CAD (coronary artery disease) 11/12/2017   COPD (chronic obstructive pulmonary disease) ([place]) 11/12/2017   ESRD (end stage renal disease) ([place])  11/12/2017   CVA (cerebral vascular accident) ([place]) 04/14/2016   Essential hypertension 11/27/2015   Hyperlipidemia 11/27/2015   Prolonged Q-T interval on ECG 61/44/3154   Aphasia complicating stroke 00/86/7619   Cervical spine arthritis 07/27/2015   Diabetic retinopathy without macular edema associated with type 2 diabetes mellitus ([place]) 07/27/2015   Osteoarthritis of spine with radiculopathy, lumbar region 07/27/2015   Obesity, Class III, BMI 40-49.9 (morbid obesity) ([place]) 07/27/2015   Vitamin D deficiency 50/93/2671   Diastolic heart failure ([place]) 10/27/2010    Orientation RESPIRATION BLADDER Height & Weight     Self  O2 (Nasal Cannula 2 L) Continent, External catheter Weight: 256 lb 6.3 oz (116.3 kg) Height:  '5\' 9"'$  (175.3 cm)  BEHAVIORAL SYMPTOMS/MOOD NEUROLOGICAL BOWEL NUTRITION STATUS   (None)  (None) Continent Diet (Heart healthy)  AMBULATORY STATUS COMMUNICATION OF NEEDS Skin   Limited Assist Verbally Normal                       Personal Care Assistance Level of Assistance  Bathing, Feeding, Dressing Bathing Assistance: Limited assistance Feeding assistance: Limited assistance Dressing Assistance: Limited assistance     Functional Limitations Info  Sight, Hearing, Speech Sight Info: Adequate Hearing Info: Adequate Speech Info: Adequate    SPECIAL CARE FACTORS FREQUENCY  PT (By licensed PT), OT (By licensed OT)     PT Frequency: 5 x week OT Frequency: 5 x week            Contractures Contractures Info: Not present    Additional Factors Info  Code Status, Allergies Code Status Info: Full code Allergies  Info: Oxycodone, Oxycodone-acetaminophen, Wound Dressing Adhesive, Hydrocodone, Tape, Tapentadol           Current Medications (03/22/2022):  This is the current hospital active medication list Current Facility-Administered Medications  Medication Dose Route Frequency Provider Last Rate Last Admin   0.9 %  sodium chloride infusion   Intravenous  PRN Wyvonnia Dusky, MD 10 mL/hr at 03/21/22 0651 Infusion Verify at 03/21/22 0651   acetaminophen (TYLENOL) tablet 650 mg  650 mg Oral Q6H PRN Mansy, Jan A, MD       Or   acetaminophen (TYLENOL) suppository 650 mg  650 mg Rectal Q6H PRN Mansy, Jan A, MD       alteplase (CATHFLO ACTIVASE) injection 2 mg  2 mg Intracatheter Once PRN Colon Flattery, NP       anticoagulant sodium citrate solution 5 mL  5 mL Intracatheter PRN Colon Flattery, NP       atorvastatin (LIPITOR) tablet 80 mg  80 mg Oral QPM Mansy, Jan A, MD   80 mg at 03/21/22 1650   calcium acetate (PHOSLO) capsule 2,001 mg  2,001 mg Oral TID WC Mansy, Jan A, MD   2,001 mg at 03/22/22 1008   cefTRIAXone (ROCEPHIN) 2 g in sodium chloride 0.9 % 100 mL IVPB  2 g Intravenous Q24H Mansy, Jan A, MD 200 mL/hr at 03/22/22 1005 2 g at 03/22/22 1005   Chlorhexidine Gluconate Cloth 2 % PADS 6 each  6 each Topical Q0600 Colon Flattery, NP   6 each at 03/22/22 0700   cholecalciferol (VITAMIN D3) 25 MCG (1000 UNIT) tablet 1,000 Units  1,000 Units Oral Daily Mansy, Jan A, MD   1,000 Units at 03/22/22 1009   cinacalcet (SENSIPAR) tablet 30 mg  30 mg Oral Q breakfast Mansy, Jan A, MD   30 mg at 03/22/22 1011   diphenoxylate-atropine (LOMOTIL) 2.5-0.025 MG per tablet 1 tablet  1 tablet Oral QID PRN Mansy, Jan A, MD       docusate sodium (COLACE) capsule 200 mg  200 mg Oral BID Wyvonnia Dusky, MD   200 mg at 03/22/22 1008   doxycycline (VIBRAMYCIN) 100 mg in sodium chloride 0.9 % 250 mL IVPB  100 mg Intravenous Q12H Wyvonnia Dusky, MD 125 mL/hr at 03/22/22 0541 100 mg at 03/22/22 0541   fluticasone (FLONASE) 50 MCG/ACT nasal spray 1 spray  1 spray Each Nare Daily Mansy, Jan A, MD   1 spray at 03/21/22 1002   gabapentin (NEURONTIN) capsule 100 mg  100 mg Oral BID Mansy, Jan A, MD   100 mg at 03/22/22 1009   glucose chewable tablet 4 g  1 tablet Oral PRN Mansy, Jan A, MD       heparin ADULT infusion 100 units/mL (25000 units/256m)  1,350  Units/hr Intravenous Continuous Mansy, Jan A, MD 13.5 mL/hr at 03/22/22 0325 1,350 Units/hr at 03/22/22 0325   heparin injection 1,000 Units  1,000 Units Intracatheter PRN BColon Flattery NP       insulin aspart (novoLOG) injection 0-6 Units  0-6 Units Subcutaneous TID WC WWyvonnia Dusky MD   1 Units at 03/21/22 1650   insulin aspart protamine- aspart (NOVOLOG MIX 70/30) injection 10 Units  10 Units Subcutaneous BID WC WWyvonnia Dusky MD   10 Units at 03/21/22 1730   ipratropium-albuterol (DUONEB) 0.5-2.5 (3) MG/3ML nebulizer solution 3 mL  3 mL Nebulization BID BRenda Rolls RPH   3 mL at 03/22/22 0751   levETIRAcetam (KEPPRA) tablet 1,000  mg  1,000 mg Oral Daily Mansy, Jan A, MD   1,000 mg at 03/22/22 1007   levETIRAcetam (KEPPRA) tablet 250 mg  250 mg Oral Once per day on Mon Wed Fri Mansy, Jan A, MD   250 mg at 03/21/22 1649   lidocaine (PF) (XYLOCAINE) 1 % injection 5 mL  5 mL Intradermal PRN Colon Flattery, NP       lidocaine-prilocaine (EMLA) cream 1 Application  1 Application Topical PRN Mansy, Jan A, MD       lidocaine-prilocaine (EMLA) cream 1 Application  1 Application Topical PRN Colon Flattery, NP       losartan (COZAAR) tablet 100 mg  100 mg Oral QHS Mansy, Jan A, MD   100 mg at 03/21/22 2334   magnesium hydroxide (MILK OF MAGNESIA) suspension 30 mL  30 mL Oral Daily PRN Mansy, Jan A, MD       metoCLOPramide (REGLAN) injection 5 mg  5 mg Intravenous Q8H PRN Mansy, Jan A, MD       metoprolol succinate (TOPROL-XL) 24 hr tablet 100 mg  100 mg Oral Q breakfast Mansy, Jan A, MD   100 mg at 03/22/22 1027   oxyCODONE (Oxy IR/ROXICODONE) immediate release tablet 5 mg  5 mg Oral Q6H PRN Mansy, Jan A, MD       pantoprazole (PROTONIX) EC tablet 40 mg  40 mg Oral Daily Mansy, Jan A, MD   40 mg at 03/22/22 1009   pentafluoroprop-tetrafluoroeth (GEBAUERS) aerosol 1 Application  1 Application Topical PRN Colon Flattery, NP       polyethylene glycol (MIRALAX / GLYCOLAX) packet  17 g  17 g Oral Daily Wyvonnia Dusky, MD   17 g at 03/22/22 1011   torsemide (DEMADEX) tablet 100 mg  100 mg Oral Q breakfast Mansy, Jan A, MD   100 mg at 03/22/22 1006   traZODone (DESYREL) tablet 25 mg  25 mg Oral QHS PRN Mansy, Jan A, MD   25 mg at 03/21/22 2334     Discharge Medications: Please see discharge summary for a list of discharge medications.  Relevant Imaging Results:  Relevant Lab Results:   Additional Information SS#: [id number]. HD MWF [facility] 6:00 am.  Candie Chroman, LCSW

## 2022-03-22 NOTE — Progress Notes (Signed)
[facility]  ROUNDING NOTE   Subjective:   Cynthia Dean is a [age]-year-old female with past medical history including COPD, diabetes, carotid stenosis, hypertension, and end-stage renal disease on hemodialysis.  Patient presents to the emergency department with complaints of weakness, fatigue and fever.  Patient has been admitted for ESRD on hemodialysis ([place]) [N18.6, Z99.2] Acute pulmonary embolism without acute cor pulmonale ([place]) [I26.99] Sepsis with acute hypoxic respiratory failure and septic shock, due to unspecified organism ([place]) [A41.9, R65.21, J96.01]  Patient is known to our practice and receives outpatient dialysis treatments at [facility] on a TTS schedule, supervised by Dr. Candiss Norse.  Patient reports she missed treatment on Saturday due to not feeling well.  Patient is seen resting in bed, alert with some confusion.  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  States she has been having weakness and fatigue for 2 to 3 days, mild cough, and wheezing.  States she has had fever and chills intermittently.  No chest pain or discomfort.  No nausea or vomiting.  Reports poor appetite.  On ED arrival include sodium 138, potassium 4.3, BUN 48, creatinine 11.43 with GFR 3, lactic acid 2.0, elevated WBC 16.5, and hemoglobin 11.1.  Respiratory panel negative for influenza and COVID-19.  Blood cultures positive for gram-positive cocci, Staphylococcus capitis.  Chest x-ray shows vascular congestion without edema.  CT angio chest shows bibasilar atelectasis with large masslike area in the left breast.  We have been consulted to manage dialysis needs.  Objective:  Vital signs in last 24 hours:  Temp:  [98.1 F (36.7 C)-98.8 F (37.1 C)] 98.1 F (36.7 C) (08/29 1139) Pulse Rate:  [62-69] 68 (08/29 1139) Resp:  [14-20] 18 (08/29 1139) BP: (122-163)/(51-103) 141/51 (08/29 1139) SpO2:   [94 %-100 %] 98 % (08/29 1139) Weight:  [116.3 kg-125 kg] 116.3 kg (08/29 0448)  Weight change:  Filed Weights   03/21/22 1859 03/21/22 2239 03/22/22 0448  Weight: 125 kg 123 kg 116.3 kg    Intake/Output: I/O last 3 completed shifts: In: 599.9 [I.V.:99.6; IV Piggyback:500.2] Out: 2000 [Other:2000]   Intake/Output this shift:  No intake/output data recorded.  Physical Exam: General: NAD, resting comfortably  Head: Normocephalic, atraumatic. Moist oral mucosal membranes  Eyes: Anicteric  Lungs:  Clear to auscultation, normal effort  Heart: Regular rate and rhythm  Abdomen:  Soft, nontender, obese  Extremities: 1+ peripheral edema.  Neurologic: Nonfocal, moving all four extremities  Skin: No lesions  Access: Left aVF    Basic Metabolic Panel: Recent Labs  Lab 03/19/22 2127 03/20/22 0448 03/21/22 0655 03/22/22 0156  NA 138 136 135 136  K 4.3 3.8 4.6 3.6  CL 100 109 101 98  CO2 24 21* 22 26  GLUCOSE 94 105* 84 91  BUN 48* 47* 60* 32*  CREATININE 11.43* 9.97* 12.47* 7.57*  CALCIUM 9.6 7.9* 8.7* 8.7*    Liver Function Tests: Recent Labs  Lab 03/19/22 2127  AST 15  ALT 7  ALKPHOS 89  BILITOT 1.2  PROT 7.2  ALBUMIN 3.4*   No results for input(s): "LIPASE", "AMYLASE" in the last 168 hours. No results for input(s): "AMMONIA" in the last 168 hours.  CBC: Recent Labs  Lab 03/19/22 2127 03/20/22 0448 03/20/22 1901 03/21/22 0655 03/21/22 1847 03/22/22 0156  WBC 16.5* 14.4*  --  9.6  --  9.7  NEUTROABS 13.0*  --   --   --   --   --  HGB 8.1* 6.6* 6.9* 7.1* 7.5* 8.0*  HCT 26.1* 21.8* 22.3* 22.7* 23.8* 25.3*  MCV 90.6 93.2  --  87.3  --  86.6  PLT 214 163  --  168  --  210    Cardiac Enzymes: No results for input(s): "CKTOTAL", "CKMB", "CKMBINDEX", "TROPONINI" in the last 168 hours.  BNP: Invalid input(s): "POCBNP"  CBG: Recent Labs  Lab 03/21/22 0826 03/21/22 1141 03/21/22 1641 03/22/22 0803 03/22/22 1140  GLUCAP 103* 154* 152* 101* 148*     Microbiology: Results for orders placed or performed during the hospital encounter of 03/19/22  Resp Panel by RT-PCR (Flu A&B, Covid) Anterior Nasal Swab     Status: None   Collection Time: 03/19/22  9:28 PM   Specimen: Anterior Nasal Swab  Result Value Ref Range Status   SARS Coronavirus 2 by RT PCR NEGATIVE NEGATIVE Final    Comment: (NOTE) SARS-CoV-2 target nucleic acids are NOT DETECTED.  The SARS-CoV-2 RNA is generally detectable in upper respiratory specimens during the acute phase of infection. The lowest concentration of SARS-CoV-2 viral copies this assay can detect is 138 copies/mL. A negative result does not preclude SARS-Cov-2 infection and should not be used as the sole basis for treatment or other patient management decisions. A negative result may occur with  improper specimen collection/handling, submission of specimen other than nasopharyngeal swab, presence of viral mutation(s) within the areas targeted by this assay, and inadequate number of viral copies(<138 copies/mL). A negative result must be combined with clinical observations, patient history, and epidemiological information. The expected result is Negative.  Fact Sheet for Patients:  EntrepreneurPulse.com.au  Fact Sheet for Healthcare Providers:  IncredibleEmployment.be  This test is no t yet approved or cleared by the [place] FDA and  has been authorized for detection and/or diagnosis of SARS-CoV-2 by FDA under an Emergency Use Authorization (EUA). This EUA will remain  in effect (meaning this test can be used) for the duration of the COVID-19 declaration under Section 564(b)(1) of the Act, 21 U.S.C.section 360bbb-3(b)(1), unless the authorization is terminated  or revoked sooner.       Influenza A by PCR NEGATIVE NEGATIVE Final   Influenza B by PCR NEGATIVE NEGATIVE Final    Comment: (NOTE) The Xpert Xpress SARS-CoV-2/FLU/RSV plus assay is intended as  an aid in the diagnosis of influenza from Nasopharyngeal swab specimens and should not be used as a sole basis for treatment. Nasal washings and aspirates are unacceptable for Xpert Xpress SARS-CoV-2/FLU/RSV testing.  Fact Sheet for Patients: EntrepreneurPulse.com.au  Fact Sheet for Healthcare Providers: IncredibleEmployment.be  This test is not yet approved or cleared by the [place] FDA and has been authorized for detection and/or diagnosis of SARS-CoV-2 by FDA under an Emergency Use Authorization (EUA). This EUA will remain in effect (meaning this test can be used) for the duration of the COVID-19 declaration under Section 564(b)(1) of the Act, 21 U.S.C. section 360bbb-3(b)(1), unless the authorization is terminated or revoked.  Performed at [facility], [address] [place], [place] [address]   Blood Culture (routine x 2)     Status: Abnormal (Preliminary result)   Collection Time: 03/19/22  9:28 PM   Specimen: BLOOD  Result Value Ref Range Status   Specimen Description   Final    BLOOD RIGHT ANTECUBITAL Performed at [facility], [address] [place], [place] [address]    Special Requests   Final    BOTTLES DRAWN AEROBIC AND ANAEROBIC Blood Culture adequate volume  Performed at [facility], [address] [place], [place] [address]    Culture  Setup Time   Final    GRAM POSITIVE COCCI IN BOTH AEROBIC AND ANAEROBIC BOTTLES CRITICAL RESULT CALLED TO, READ BACK BY AND VERIFIED WITH: CAROLYN CHILDS 03/20/22 1457 AMK Performed at [facility], [address], [place], [place] [address]    Culture STAPHYLOCOCCUS CAPITIS (A)  Final   Report Status PENDING  Incomplete   Organism ID, Bacteria STAPHYLOCOCCUS CAPITIS  Final      Susceptibility   Staphylococcus capitis - MIC*    CIPROFLOXACIN <=0.5 SENSITIVE Sensitive     ERYTHROMYCIN <=0.25 SENSITIVE Sensitive     GENTAMICIN <=0.5  SENSITIVE Sensitive     OXACILLIN <=0.25 SENSITIVE Sensitive     TETRACYCLINE <=1 SENSITIVE Sensitive     VANCOMYCIN <=0.5 SENSITIVE Sensitive     TRIMETH/SULFA <=10 SENSITIVE Sensitive     CLINDAMYCIN <=0.25 SENSITIVE Sensitive     RIFAMPIN <=0.5 SENSITIVE Sensitive     Inducible Clindamycin NEGATIVE Sensitive     * STAPHYLOCOCCUS CAPITIS  Blood Culture ID Panel (Reflexed)     Status: Abnormal   Collection Time: 03/19/22  9:28 PM  Result Value Ref Range Status   Enterococcus faecalis NOT DETECTED NOT DETECTED Final   Enterococcus Faecium NOT DETECTED NOT DETECTED Final   Listeria monocytogenes NOT DETECTED NOT DETECTED Final   Staphylococcus species DETECTED (A) NOT DETECTED Final    Comment: CRITICAL RESULT CALLED TO, READ BACK BY AND VERIFIED WITH: CAROLYN CHILDS 03/20/22 1457 AMK    Staphylococcus aureus (BCID) NOT DETECTED NOT DETECTED Final   Staphylococcus epidermidis NOT DETECTED NOT DETECTED Final   Staphylococcus lugdunensis NOT DETECTED NOT DETECTED Final   Streptococcus species NOT DETECTED NOT DETECTED Final   Streptococcus agalactiae NOT DETECTED NOT DETECTED Final   Streptococcus pneumoniae NOT DETECTED NOT DETECTED Final   Streptococcus pyogenes NOT DETECTED NOT DETECTED Final   A.calcoaceticus-baumannii NOT DETECTED NOT DETECTED Final   Bacteroides fragilis NOT DETECTED NOT DETECTED Final   Enterobacterales NOT DETECTED NOT DETECTED Final   Enterobacter cloacae complex NOT DETECTED NOT DETECTED Final   Escherichia coli NOT DETECTED NOT DETECTED Final   Klebsiella aerogenes NOT DETECTED NOT DETECTED Final   Klebsiella oxytoca NOT DETECTED NOT DETECTED Final   Klebsiella pneumoniae NOT DETECTED NOT DETECTED Final   Proteus species NOT DETECTED NOT DETECTED Final   Salmonella species NOT DETECTED NOT DETECTED Final   Serratia marcescens NOT DETECTED NOT DETECTED Final   Haemophilus influenzae NOT DETECTED NOT DETECTED Final   Neisseria meningitidis NOT DETECTED NOT  DETECTED Final   Pseudomonas aeruginosa NOT DETECTED NOT DETECTED Final   Stenotrophomonas maltophilia NOT DETECTED NOT DETECTED Final   Candida albicans NOT DETECTED NOT DETECTED Final   Candida auris NOT DETECTED NOT DETECTED Final   Candida glabrata NOT DETECTED NOT DETECTED Final   Candida krusei NOT DETECTED NOT DETECTED Final   Candida parapsilosis NOT DETECTED NOT DETECTED Final   Candida tropicalis NOT DETECTED NOT DETECTED Final   Cryptococcus neoformans/gattii NOT DETECTED NOT DETECTED Final    Comment: Performed at [facility], [address] [place], [place] [address]  Blood Culture (routine x 2)     Status: Abnormal   Collection Time: 03/19/22 10:23 PM   Specimen: BLOOD  Result Value Ref Range Status   Specimen Description   Final    BLOOD BLOOD RIGHT FOREARM Performed at [facility], [address] [place],  [address]  Special Requests   Final    BOTTLES DRAWN AEROBIC AND ANAEROBIC Blood Culture adequate volume Performed at [facility], [address] [place], [place] [address]    Culture  Setup Time   Final    GRAM POSITIVE COCCI IN BOTH AEROBIC AND ANAEROBIC BOTTLES CRITICAL RESULT CALLED TO, READ BACK BY AND VERIFIED WITH: ELVERA MADUEME 03/20/22 1545 AMK Performed at [facility], [address] [place], [place] [address]    Culture (A)  Final    STAPHYLOCOCCUS CAPITIS SUSCEPTIBILITIES PERFORMED ON PREVIOUS CULTURE WITHIN THE LAST 5 DAYS. Performed at [facility], [place] [address] [place], [place] [address]    Report Status 03/22/2022 FINAL  Final  Urine Culture     Status: Abnormal   Collection Time: 03/20/22  1:25 AM   Specimen: In/Out Cath Urine  Result Value Ref Range Status   Specimen Description   Final    IN/OUT CATH URINE Performed at [facility], [address] [place], [place] [address]    Special Requests   Final    NONE Performed at [facility], [address] [place], [place] [address]    Culture 4,000 COLONIES/mL STAPHYLOCOCCUS EPIDERMIDIS (A)  Final   Report Status 03/22/2022 FINAL  Final   Organism ID, Bacteria STAPHYLOCOCCUS EPIDERMIDIS (A)  Final      Susceptibility   Staphylococcus epidermidis - MIC*    CIPROFLOXACIN <=0.5 SENSITIVE Sensitive     GENTAMICIN <=0.5 SENSITIVE Sensitive     NITROFURANTOIN <=16 SENSITIVE Sensitive     OXACILLIN <=0.25 SENSITIVE Sensitive     TETRACYCLINE <=1 SENSITIVE Sensitive     VANCOMYCIN 2 SENSITIVE Sensitive     TRIMETH/SULFA 80 RESISTANT Resistant     CLINDAMYCIN >=8 RESISTANT Resistant     RIFAMPIN <=0.5 SENSITIVE Sensitive     Inducible Clindamycin NEGATIVE Sensitive     * 4,000 COLONIES/mL STAPHYLOCOCCUS EPIDERMIDIS  Culture, blood (Routine X 2) w Reflex to ID Panel     Status: None (Preliminary result)   Collection Time: 03/21/22 11:00 AM   Specimen: BLOOD  Result Value Ref Range Status   Specimen Description BLOOD BLOOD RIGHT FOREARM  Final   Special Requests   Final    BOTTLES DRAWN AEROBIC AND ANAEROBIC Blood Culture adequate volume   Culture   Final    NO GROWTH < 24 HOURS Performed at [facility], [address] [place], [place] [address]    Report Status PENDING  Incomplete    Coagulation Studies: Recent Labs    03/19/22 2125-10-20  LABPROT 14.6  INR 1.2    Urinalysis: Recent Labs    03/20/22 0125  COLORURINE YELLOW*  LABSPEC 1.010  PHURINE 7.0  GLUCOSEU 50*  HGBUR MODERATE*  BILIRUBINUR NEGATIVE  KETONESUR NEGATIVE  PROTEINUR 100*  NITRITE NEGATIVE  LEUKOCYTESUR NEGATIVE      Imaging: No results found.   Medications:    sodium chloride 10 mL/hr at 03/21/22 0651   anticoagulant sodium citrate     cefTRIAXone (ROCEPHIN)  IV Stopped (03/22/22 1152)   doxycycline (VIBRAMYCIN) IV 100 mg (03/22/22 0541)    apixaban  10 mg Oral BID   Followed by   Derrill Memo ON 03/29/2022] apixaban  5 mg Oral BID   atorvastatin  80 mg Oral QPM   calcium acetate  2,001  mg Oral TID WC   Chlorhexidine Gluconate Cloth  6 each Topical Q0600   vitamin D3  1,000 Units Oral Daily   cinacalcet  30 mg Oral Q breakfast   docusate sodium  200 mg Oral BID   fluticasone  1 spray Each Nare Daily   gabapentin  100 mg Oral BID   insulin aspart  0-6 Units Subcutaneous TID WC   insulin aspart protamine- aspart  10 Units Subcutaneous BID WC   ipratropium-albuterol  3 mL Nebulization BID   levETIRAcetam  1,000 mg Oral Daily   levETIRAcetam  250 mg Oral Once per day on Mon Wed Fri   losartan  100 mg Oral QHS   metoprolol succinate  100 mg Oral Q breakfast   pantoprazole  40 mg Oral Daily   polyethylene glycol  17 g Oral Daily   torsemide  100 mg Oral Q breakfast   sodium chloride, acetaminophen **OR** acetaminophen, alteplase, anticoagulant sodium citrate, diphenoxylate-atropine, glucose, heparin, lidocaine (PF), lidocaine-prilocaine, lidocaine-prilocaine, magnesium hydroxide, metoCLOPramide (REGLAN) injection, oxyCODONE, pentafluoroprop-tetrafluoroeth, traZODone  Assessment/ Plan:  Ms. Rhonna Holster is a 71 y.o.  female ast medical history including COPD, diabetes, carotid stenosis, hypertension, and end-stage renal disease on hemodialysis.  Patient presents to the emergency department with complaints of weakness, fatigue and fever.  Patient has been admitted for ESRD on hemodialysis ([place]) [N18.6, Z99.2] Acute pulmonary embolism without acute cor pulmonale ([place]) [I26.99] Sepsis with acute hypoxic respiratory failure and septic shock, due to unspecified organism ([place]) [A41.9, R65.21, J96.01]  [facility]/MWF/left aVF  End-stage renal disease on hemodialysis.  Will maintain outpatient schedule if possible.  Patient received dialysis yesterday, UF 1.5 L achieved.  Next treatment scheduled for Wednesday.  2. Anemia of chronic kidney disease Lab Results  Component Value Date   HGB 8.0 (L) 03/22/2022    Hemoglobin below desired target.  Patient  receives [place] outpatient.  3. Secondary Hyperparathyroidism: with outpatient labs: PTH 613, phosphorus 4.8, calcium 9.0 on 03/07/2022.   Lab Results  Component Value Date   PTH 131 (H) 03/14/2018   CALCIUM 8.7 (L) 03/22/2022   PHOS 3.0 10/25/2019  Cholecalciferol ordered outpatient. Calcium within desired range.  Continue calcium acetate with meals.  4. Diabetes mellitus type II with chronic kidney disease: insulin dependent. Home regimen includes Novolin. Most recent hemoglobin A1c is 7.5 on 01/29/20.   5.  Hypertension with chronic kidney disease.  Home regimen includes losartan, metoprolol, and torsemide.  Patient currently receiving these medications.   LOS: 2   8/29/20232:39 PM

## 2022-03-22 NOTE — Progress Notes (Signed)
ANTICOAGULATION CONSULT NOTE  Pharmacy Consult for Heparin Infusion transition to Eliquis Indication: pulmonary embolus  Patient Measurements: Height: '5\' 9"'$  (175.3 cm) Weight: 116.3 kg (256 lb 6.3 oz) IBW/kg (Calculated) : 66.2 Heparin Dosing Weight: 92.5 kg  Labs: Recent Labs    03/19/22 2127 03/20/22 0448 03/20/22 0915 03/21/22 0655 03/21/22 1526 03/21/22 1847 03/22/22 0156 03/22/22 1153  HGB 8.1* 6.6*   < > 7.1*  --  7.5* 8.0*  --   HCT 26.1* 21.8*   < > 22.7*  --  23.8* 25.3*  --   PLT 214 163  --  168  --   --  210  --   APTT 32  --   --   --   --   --   --   --   LABPROT 14.6  --   --   --   --   --   --   --   INR 1.2  --   --   --   --   --   --   --   HEPARINUNFRC  --   --    < > 0.10* 0.16*  --  0.20* <0.10*  CREATININE 11.43* 9.97*  --  12.47*  --   --  7.57*  --    < > = values in this interval not displayed.     Estimated Creatinine Clearance: 9.3 mL/min (A) (by C-G formula based on SCr of 7.57 mg/dL (H)).   Medical History: Past Medical History:  Diagnosis Date   Acute on chronic respiratory failure with hypoxia ([place]) 07/30/2019   Acute respiratory failure with hypoxia ([place]) 12/31/2018   Carotid arterial disease ([place])    a. 02/2018 < bilat ICA stenosis.   COPD (chronic obstructive pulmonary disease) ([facility])    Coronary artery disease    a. 11/2015 MV: EF 57%, no ischemia/infarct.   Cryptogenic stroke ([place])    a. 10/2017 s/p implantable loop recorder (no Afib to date).   Diabetes mellitus without complication ([place])    Diarrhea    ESRD on dialysis [facility])    GI bleed    a. 01/2019 EGD/Colonoscopy: Mild gastritis. Colitis.   Heart murmur    a. 12/2018 Echo: EF 55-60%, impaired relaxation. Mod dil LA. Mildly dil RA. Mild Ca2+ of AoV.   Hematemesis 06/27/2017   History of 2019 novel coronavirus disease (COVID-19) 07/30/2019   Hyperkalemia    Hyperlipidemia    Hypertension    Intractable nausea and vomiting 08/08/2015   Nausea & vomiting 05/30/2018   Nausea and  vomiting 05/29/2018   Pneumonia due to COVID-19 virus     Medications:  Pt given initial dose of heparin 5000 units 8/27 @ 0045  Assessment: Pt is a 71 yo female w/ ESRD on HD presenting to ED c/o increase weakness & fatigue, found w/ small pulmonary emboli within both lower lobes and the right upper lobe.  8/27 0915 HL > 1.1 8/27 1901 HL > 1.1 8/28 0655 HL <0.10 8/28 1526 HL 0.16 8/29 0156 HL  0.20   Goal of Therapy:  Heparin level 0.3-0.7 units/ml Monitor platelets by anticoagulation protocol: Yes   Plan:  Pharmacy consulted for heparin transition to Eliquis.  Discontinue heparin infusion and start Eliquis '10mg'$  BID x7 days followed by Eliquis '5mg'$  BID.   Darrick Penna 03/22/2022 2:10 PM

## 2022-03-22 NOTE — TOC Benefit Eligibility Note (Signed)
Patient Teacher, English as a foreign language completed.    The patient is currently admitted and upon discharge could be taking Eliquis 5 mg.  The current 30 day co-pay is $0.00.   The patient is insured through [place], [place] Patient Advocate Specialist [place] Patient Advocate Team Direct Number: [phone]  Fax: [phone]

## 2022-03-22 NOTE — Progress Notes (Signed)
ANTICOAGULATION CONSULT NOTE  Pharmacy Consult for Heparin Infusion Indication: pulmonary embolus  Patient Measurements: Height: '5\' 9"'$  (175.3 cm) Weight: 123 kg (271 lb 2.7 oz) IBW/kg (Calculated) : 66.2 Heparin Dosing Weight: 92.5 kg  Labs: Recent Labs    03/19/22 2127 03/20/22 0448 03/20/22 0915 03/21/22 0655 03/21/22 1526 03/21/22 1847 03/22/22 0156  HGB 8.1* 6.6*   < > 7.1*  --  7.5* 8.0*  HCT 26.1* 21.8*   < > 22.7*  --  23.8* 25.3*  PLT 214 163  --  168  --   --  210  APTT 32  --   --   --   --   --   --   LABPROT 14.6  --   --   --   --   --   --   INR 1.2  --   --   --   --   --   --   HEPARINUNFRC  --   --    < > 0.10* 0.16*  --  0.20*  CREATININE 11.43* 9.97*  --  12.47*  --   --  7.57*   < > = values in this interval not displayed.     Estimated Creatinine Clearance: 9.6 mL/min (A) (by C-G formula based on SCr of 7.57 mg/dL (H)).   Medical History: Past Medical History:  Diagnosis Date   Acute on chronic respiratory failure with hypoxia ([place]) 07/30/2019   Acute respiratory failure with hypoxia ([place]) 12/31/2018   Carotid arterial disease ([place])    a. 02/2018 < bilat ICA stenosis.   COPD (chronic obstructive pulmonary disease) (H[facility])    Coronary artery disease    a. 11/2015 MV: EF 57%, no ischemia/infarct.   Cryptogenic stroke ([place])    a. 10/2017 s/p implantable loop recorder (no Afib to date).   Diabetes mellitus without complication ([place])    Diarrhea    ESRD on dialysis [facility])    GI bleed    a. 01/2019 EGD/Colonoscopy: Mild gastritis. Colitis.   Heart murmur    a. 12/2018 Echo: EF 55-60%, impaired relaxation. Mod dil LA. Mildly dil RA. Mild Ca2+ of AoV.   Hematemesis 06/27/2017   History of 2019 novel coronavirus disease (COVID-19) 07/30/2019   Hyperkalemia    Hyperlipidemia    Hypertension    Intractable nausea and vomiting 08/08/2015   Nausea & vomiting 05/30/2018   Nausea and vomiting 05/29/2018   Pneumonia due to COVID-19 virus     Medications:  Pt  given initial dose of heparin 5000 units 8/27 @ 0045  Assessment: Pt is a 71 yo female w/ ESRD on HD presenting to ED c/o increase weakness & fatigue, found w/ small pulmonary emboli within both lower lobes and the right upper lobe.  8/27 0915 HL > 1.1 8/27 1901 HL > 1.1 8/28 0655 HL <0.10 8/28 1526 HL 0.16 8/29 0156 HL  0.20   Goal of Therapy:  Heparin level 0.3-0.7 units/ml Monitor platelets by anticoagulation protocol: Yes   Plan:  8/29:  HL @ 0156 = 0.20, SUBtherapeutic  - Will order heparin 1400 units IV X 1 bolus and increase drip rate to 1350 units/hr.  - Will recheck HL 8 hrs after rate change.   Gabriele Zwilling D 03/22/2022 2:54 AM

## 2022-03-22 NOTE — Progress Notes (Signed)
Hemodialysis patient known at [facility] MWF 6:00am. Spoke with patient's daughter, she had no dialysis concerns however mentioned needing a bedside commode. I informed Annye Rusk, she stated that if patient goes to SNF, they will provide the equipment that the patient will need at discharge from them. Called daughter to pass this information along, no answer and VM has not been set up.

## 2022-03-22 NOTE — TOC Initial Note (Addendum)
Transition of Care [facility]) - Initial/Assessment Note    Patient Details  Name: Cynthia Dean MRN: [id number] Date of Birth: 09/04/1950  Transition of Care [facility]) CM/SW Contact:    Candie Chroman, LCSW Phone Number: 03/22/2022, 11:08 AM  Clinical Narrative:  CSW met with patient. No supports at bedside. CSW introduced role and explained that therapy recommendations would be discussed. She is agreeable to SNF placement but appears confused. Per chart review, her daughter Joelene Millin is her HCPOA. Called her and confirmed. She is agreeable to SNF placement as well. Will follow up once bed offers available. No further concerns. CSW encouraged patient and her daughter to contact CSW as needed. CSW will continue to follow patient and her daughter for support and facilitate discharge to SNF once medically stable.        4:04 pm: No bed offers so far.          Expected Discharge Plan: Skilled Nursing Facility Barriers to Discharge: Continued Medical Work up   Patient Goals and CMS Choice Patient states their goals for this hospitalization and ongoing recovery are:: Patient not fully oriented. CMS Medicare.gov Compare Post Acute Care list provided to:: Patient    Expected Discharge Plan and Services Expected Discharge Plan: [place] Choice: [place] arrangements for the past 2 months: Single Family Home                                      Prior Living Arrangements/Services Living arrangements for the past 2 months: Single Family Home Lives with:: Adult Children, Significant Other Patient language and need for interpreter reviewed:: Yes Do you feel safe going back to the place where you live?: Yes      Need for Family Participation in Patient Care: Yes (Comment) Care giver support system in place?: Yes (comment)   Criminal Activity/Legal Involvement Pertinent to Current Situation/Hospitalization: No - Comment as  needed  Activities of Daily Living Home Assistive Devices/Equipment: Gilford Rile (specify type) ADL Screening (condition at time of admission) Patient's cognitive ability adequate to safely complete daily activities?: No Is the patient deaf or have difficulty hearing?: No Does the patient have difficulty seeing, even when wearing glasses/contacts?: No Does the patient have difficulty concentrating, remembering, or making decisions?: No Patient able to express need for assistance with ADLs?: Yes Does the patient have difficulty dressing or bathing?: Yes Independently performs ADLs?: No Does the patient have difficulty walking or climbing stairs?: Yes Weakness of Legs: Both Weakness of Arms/Hands: Both  Permission Sought/Granted Permission sought to share information with : Facility Sport and exercise psychologist, Family Supports    Share Information with NAME: Shabre Kreher  Permission granted to share info w AGENCY: SNF's  Permission granted to share info w Relationship: Daughter/HCPOA  Permission granted to share info w Contact Information: [phone]  Emotional Assessment Appearance:: Appears stated age Attitude/Demeanor/Rapport: Unable to Assess Affect (typically observed): Unable to Assess Orientation: : Oriented to Self Alcohol / Substance Use: Not Applicable Psych Involvement: No (comment)  Admission diagnosis:  ESRD on hemodialysis ([place]) [N18.6, Z99.2] Acute pulmonary embolism without acute cor pulmonale ([place]) [I26.99] Sepsis with acute hypoxic respiratory failure and septic shock, due to unspecified organism ([place]) [A41.9, R65.21, J96.01] Patient Active Problem List   Diagnosis Date Noted   Acute pulmonary embolism without acute cor pulmonale ([place]) 03/20/2022   Dyslipidemia 03/20/2022   Diabetic neuropathy (  [place]) 03/20/2022   Sepsis due to cellulitis ([place]) 03/20/2022   Delay kidney tx func d/t fluid overload requiring acute dialysis ([place]) 01/29/2020   Delay kidney tx func d/t  ATN and fluid overload require acute dialysis ([place]) 01/29/2020   Hypoglycemia 10/21/2019   Unresponsiveness 10/21/2019   GERD (gastroesophageal reflux disease) 10/21/2019   Hypothermia    Sepsis with encephalopathy without septic shock (H[facility])    Acute metabolic encephalopathy    Acute on chronic diastolic CHF (congestive heart failure) ([place])    Acute on chronic respiratory failure with hypoxia ([place]) 07/30/2019   Rectal bleed 02/13/2019   Acute respiratory failure with hypoxia ([place]) 12/31/2018   Aphasia as late effect of cerebrovascular accident (CVA) 05/01/2018   Seizure ([place]) 03/14/2018   Stroke ([place]) 03/07/2018   Slurred speech 11/14/2017   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[place]) 11/12/2017   CAD (coronary artery disease) 11/12/2017   COPD (chronic obstructive pulmonary disease) ([place]) 11/12/2017   ESRD (end stage renal disease) ([place]) 11/12/2017   CVA (cerebral vascular accident) ([place]) 04/14/2016   Essential hypertension 11/27/2015   Hyperlipidemia 11/27/2015   Prolonged Q-T interval on ECG 53/00/5110   Aphasia complicating stroke 21/05/7355   Cervical spine arthritis 07/27/2015   Diabetic retinopathy without macular edema associated with type 2 diabetes mellitus ([place]) 07/27/2015   Osteoarthritis of spine with radiculopathy, lumbar region 07/27/2015   Obesity, Class III, BMI 40-49.9 (morbid obesity) ([place]) 07/27/2015   Vitamin D deficiency 70/14/1030   Diastolic heart failure ([place]) 10/27/2010   PCP:  Lowella Bandy, MD Pharmacy:   [facility] [address] [place] [address] Phone: [phone] Fax: [phone]     Social Determinants of Health (SDOH) Interventions    Readmission Risk Interventions    10/25/2019    8:43 AM 10/24/2019   10:14 AM 08/07/2019    3:25 PM  Readmission Risk Prevention Plan  Transportation Screening Complete  Complete  PCP or Specialist Appt within 3-5 Days Not  Complete    HRI or [place] Complete Complete   Palliative Care Screening Not Applicable    Medication Review (RN Care Manager) Complete  Complete  HRI or [place]   Complete  Palliative Care Screening   Not [place]   Not Applicable

## 2022-03-23 DIAGNOSIS — R7881 Bacteremia: Secondary | ICD-10-CM | POA: Diagnosis not present

## 2022-03-23 DIAGNOSIS — N611 Abscess of the breast and nipple: Secondary | ICD-10-CM

## 2022-03-23 DIAGNOSIS — D638 Anemia in other chronic diseases classified elsewhere: Secondary | ICD-10-CM

## 2022-03-23 DIAGNOSIS — B957 Other staphylococcus as the cause of diseases classified elsewhere: Secondary | ICD-10-CM

## 2022-03-23 DIAGNOSIS — N186 End stage renal disease: Secondary | ICD-10-CM | POA: Diagnosis not present

## 2022-03-23 DIAGNOSIS — I2699 Other pulmonary embolism without acute cor pulmonale: Secondary | ICD-10-CM | POA: Diagnosis not present

## 2022-03-23 DIAGNOSIS — E785 Hyperlipidemia, unspecified: Secondary | ICD-10-CM | POA: Diagnosis not present

## 2022-03-23 LAB — ECHOCARDIOGRAM COMPLETE
AR max vel: 1.58 cm2
AV Area VTI: 1.79 cm2
AV Area mean vel: 1.73 cm2
AV Mean grad: 4.7 mmHg
AV Peak grad: 9.3 mmHg
Ao pk vel: 1.53 m/s
Area-P 1/2: 2.63 cm2
Height: 69 in
S' Lateral: 3.6 cm
Weight: 4102.32 oz

## 2022-03-23 LAB — BASIC METABOLIC PANEL
Anion gap: 10 (ref 5–15)
BUN: 38 mg/dL — ABNORMAL HIGH (ref 8–23)
CO2: 26 mmol/L (ref 22–32)
Calcium: 9.5 mg/dL (ref 8.9–10.3)
Chloride: 101 mmol/L (ref 98–111)
Creatinine, Ser: 9.31 mg/dL — ABNORMAL HIGH (ref 0.44–1.00)
GFR, Estimated: 4 mL/min — ABNORMAL LOW (ref >60)
Glucose, Bld: 101 mg/dL — ABNORMAL HIGH (ref 70–99)
Potassium: 4.4 mmol/L (ref 3.5–5.1)
Sodium: 137 mmol/L (ref 135–145)

## 2022-03-23 LAB — GLUCOSE, CAPILLARY
Glucose-Capillary: 108 mg/dL — ABNORMAL HIGH (ref 70–99)
Glucose-Capillary: 113 mg/dL — ABNORMAL HIGH (ref 70–99)
Glucose-Capillary: 102 mg/dL — ABNORMAL HIGH (ref 70–99)
Glucose-Capillary: 141 mg/dL — ABNORMAL HIGH (ref 70–99)

## 2022-03-23 LAB — HEMOGLOBIN A1C
Hgb A1c MFr Bld: 5.8 % — ABNORMAL HIGH (ref 4.8–5.6)
Mean Plasma Glucose: 119.76 mg/dL

## 2022-03-23 LAB — HEPATITIS B SURFACE ANTIBODY,QUALITATIVE: Hep B S Ab: NONREACTIVE

## 2022-03-23 LAB — CBC
HCT: 24.3 % — ABNORMAL LOW (ref 36.0–46.0)
Hemoglobin: 7.5 g/dL — ABNORMAL LOW (ref 12.0–15.0)
MCH: 27.1 pg (ref 26.0–34.0)
MCHC: 30.9 g/dL (ref 30.0–36.0)
MCV: 87.7 fL (ref 80.0–100.0)
Platelets: 235 10*3/uL (ref 150–400)
RBC: 2.77 MIL/uL — ABNORMAL LOW (ref 3.87–5.11)
RDW: 16.5 % — ABNORMAL HIGH (ref 11.5–15.5)
WBC: 10.4 10*3/uL (ref 4.0–10.5)
nRBC: 0 % (ref 0.0–0.2)

## 2022-03-23 LAB — CULTURE, BLOOD (ROUTINE X 2): Special Requests: ADEQUATE

## 2022-03-23 LAB — HEPATITIS B CORE ANTIBODY, TOTAL: Hep B Core Total Ab: NONREACTIVE

## 2022-03-23 LAB — HEPATITIS C ANTIBODY: HCV Ab: NONREACTIVE

## 2022-03-23 MED ORDER — CEFAZOLIN SODIUM-DEXTROSE 1-4 GM/50ML-% IV SOLN
1.0000 g | Freq: Every day | INTRAVENOUS | Status: DC
  Administered 2022-03-23 – 2022-03-25 (×3): 1 g via INTRAVENOUS
  Filled 2022-03-23 (×4): qty 50

## 2022-03-23 MED ORDER — EPOETIN ALFA 10000 UNIT/ML IJ SOLN
10000.0000 [IU] | INTRAMUSCULAR | Status: DC
  Administered 2022-03-25 – 2022-04-01 (×2): 10000 [IU] via INTRAVENOUS

## 2022-03-23 MED ORDER — IPRATROPIUM-ALBUTEROL 0.5-2.5 (3) MG/3ML IN SOLN: 3.0000 mL | RESPIRATORY_TRACT | Status: DC | PRN

## 2022-03-23 NOTE — Progress Notes (Addendum)
PROGRESS NOTE   HPI was taken from Dr. Sidney Ace: Cynthia Dean Cynthia Dean is a 71 y.o. African-American female with medical history significant for COPD, carotid stenosis, type 2 diabetes mellitus, end-stage renal disease on hemodialysis on MWF, hypertension and dyslipidemia, as well as invasive ductal carcinoma of the left breast status post surgical excision at [facility] recently, who presented to the emergency room with acute onset of generalized weakness and fatigue for the last couple of days.  She missed hemodialysis session yesterday.  She was fairly confused in the ER.  She has been having dyspnea with mild cough and occasional wheezing.  She admitted to fever and chills.  No chest pain or palpitations.  No nausea or vomiting or diarrhea or abdominal pain.  No bleeding diathesis.   ED Course: Upon presentation to the emergency room temperature was 102.9 with BP 139/57 heart rate 50 with respiratory to 23  with pulse oximetry of 87% on room air and 98% on 4 L of O2 by nasal cannula.  Labs revealed leukocytosis 16.5 with neutrophilia and hemoglobin of 8.1 with hematocrit 26.1 with platelets of 214.  Influenza antigens and COVID-19 PCR came back negative.  Blood cultures were drawn. EKG as reviewed by me : EKG showed sinus rhythm with rate of 81 with T wave inversion laterally. Imaging: Portable chest x-ray showed increasing vascular congestion without pulmonary edema.  Chest CTA revealed small pulmonary emboli within both lower lobes and the right upper lobe with no evidence for right heart strain.  It showed cardiomegaly and coronary artery disease with bibasal atelectasis and large masslike area in the left breast with surrounding soft tissue gas that corresponds with recent left lumpectomy.   The patient was given IV cefepime and heparin bolus and drip as well as 500 mL IV normal saline.  She will be admitted to a progressive unit bed for further evaluation and management.  As per Dr. Jimmye Norman  8/27-8/29/23: Pt presented w/ shortness of breath and was found to have acute pulmonary embolism w/o right heart strain. Pt was initially put on IV heparin drip and then changed to po eliquis. Echo is pending. Also, pt was admitted for mild breast cellulitis and has been on IV rocephin. Urine cx was growing staph epidermis and blood cxs growing stap captis for which pt has been on IV doxycycline. The blood cxs likely represent a containment. Repeat blood cxs NGTD. PT recs SNF.   8/30 working with PT   [name]  HYQ:[id number] DOB: 1950-09-25 DOA: 03/19/2022 PCP: Lowella Bandy, MD   Assessment & Plan:   Principal Problem:   Acute pulmonary embolism without acute cor pulmonale ([place]) Active Problems:   Sepsis due to cellulitis ([place])   ESRD (end stage renal disease) ([place])   Essential hypertension   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[place])   Dyslipidemia   Diabetic neuropathy ([place])   Urinary tract infection   Anemia of chronic disease  Assessment and Plan: Acute pulmonary embolism: without acute cor pulmonale. D/c IV heparin and start eliquis.  8/30 aspirin Plavix on hold  Echo with EF 50 to 55%.  Grade 2 diastolic dysfunction.  RV normal function and size Echo is pending. Holding home dose of aspirin, plavix  ACD: likely secondary to ESRD.  W/ possible component of acute blood loss anemia. S/p 1 unit of pRBCs transfused so far.  8/30 hemoglobin 7.5 continue to monitor      Sepsis: see Dr. Randel Books note on how pt  met sepsis criteria. Likely secondary to left breast cellulitis. 8/30 on Rocephin Sepsis resolved   Mild cellulitis of left breast:  On Rocephin   Staph capitis bacteremia:  Repest ntd, but was on abx Unsure if contaminant Continue with iv abx Will consult ID     Possible UTI: urine cx staph epidermidis. Continue on IV doxy    ESRD: on HD MWF. Missed HD on Friday. Nephro following and recs apprec  Constipation:  continue on colace, miralax   Breast cancer: recent dx. S/p surgical excision at [facility] & chemo. High risk of blood clots w/ hx of cancer    Diabetic neuropathy: continue on home dose of gabapentin    HLD: continue on statin    DM2: likely poorly controlled. HbA1c ordered for AM. Continue on 70/30, SSI w/ accuchecks    HTN: continue on metoprolol, losartan           DVT prophylaxis: eliquis  Code Status:  full  Family Communication:  Disposition Plan: depends on PT/OT recs  Level of care: Progressive  Status is: Inpatient Remains inpatient appropriate because: severity of illness    Consultants:  Nephro   Procedures:  Antimicrobials: rocephin, doxycycline    Subjective: Denies cp, dizziness, sob  Objective: Vitals:   03/23/22 0030 03/23/22 0430 03/23/22 0431 03/23/22 0818  BP: (!) 150/50 (!) 175/63 (!) 169/69   Pulse:  65 64 64  Resp: $Remo'15 19  16  'ZnNpg$ Temp: 98.9 F (37.2 C) 98.6 F (37 C)    TempSrc: Oral Oral    SpO2: 97% 100%  94%  Weight:      Height:        Intake/Output Summary (Last 24 hours) at 03/23/2022 0826 Last data filed at 03/22/2022 1831 Gross per 24 hour  Intake 627.1 ml  Output 0 ml  Net 627.1 ml   Filed Weights   03/21/22 1859 03/21/22 2239 03/22/22 0448  Weight: 125 kg 123 kg 116.3 kg    Examination: Calm, NAD Cta no w/r Reg s1/s2 no gallop Soft benign +bs No edema Awake and alert Mood and affect appropriate in current setting     Data Reviewed: I have personally reviewed following labs and imaging studies  CBC: Recent Labs  Lab 03/19/22 2127 03/20/22 0448 03/20/22 1901 03/21/22 0655 03/21/22 1847 03/22/22 0156 03/23/22 0647  WBC 16.5* 14.4*  --  9.6  --  9.7 10.4  NEUTROABS 13.0*  --   --   --   --   --   --   HGB 8.1* 6.6* 6.9* 7.1* 7.5* 8.0* 7.5*  HCT 26.1* 21.8* 22.3* 22.7* 23.8* 25.3* 24.3*  MCV 90.6 93.2  --  87.3  --  86.6 87.7  PLT 214 163  --  168  --  210 735   Basic Metabolic Panel: Recent Labs  Lab  03/19/22 2127 03/20/22 0448 03/21/22 0655 03/22/22 0156 03/23/22 0647  NA 138 136 135 136 137  K 4.3 3.8 4.6 3.6 4.4  CL 100 109 101 98 101  CO2 24 21* $Remo'22 26 26  'FqFRA$ GLUCOSE 94 105* 84 91 101*  BUN 48* 47* 60* 32* 38*  CREATININE 11.43* 9.97* 12.47* 7.57* 9.31*  CALCIUM 9.6 7.9* 8.7* 8.7* 9.5   GFR: Estimated Creatinine Clearance: 7.5 mL/min (A) (by C-G formula based on SCr of 9.31 mg/dL (H)). Liver Function Tests: Recent Labs  Lab 03/19/22 2127  AST 15  ALT 7  ALKPHOS 89  BILITOT 1.2  PROT 7.2  ALBUMIN 3.4*  No results for input(s): "LIPASE", "AMYLASE" in the last 168 hours. No results for input(s): "AMMONIA" in the last 168 hours. Coagulation Profile: Recent Labs  Lab 03/19/22 2127  INR 1.2   Cardiac Enzymes: No results for input(s): "CKTOTAL", "CKMB", "CKMBINDEX", "TROPONINI" in the last 168 hours. BNP (last 3 results) No results for input(s): "PROBNP" in the last 8760 hours. HbA1C: No results for input(s): "HGBA1C" in the last 72 hours. CBG: Recent Labs  Lab 03/22/22 0803 03/22/22 1140 03/22/22 1619 03/22/22 2011 03/23/22 0743  GLUCAP 101* 148* 133* 104* 108*   Lipid Profile: No results for input(s): "CHOL", "HDL", "LDLCALC", "TRIG", "CHOLHDL", "LDLDIRECT" in the last 72 hours. Thyroid Function Tests: No results for input(s): "TSH", "T4TOTAL", "FREET4", "T3FREE", "THYROIDAB" in the last 72 hours. Anemia Panel: No results for input(s): "VITAMINB12", "FOLATE", "FERRITIN", "TIBC", "IRON", "RETICCTPCT" in the last 72 hours. Sepsis Labs: Recent Labs  Lab 03/19/22 2127 03/20/22 0026 03/20/22 0448  PROCALCITON  --   --  3.92  LATICACIDVEN 2.0* 1.1  --     Recent Results (from the past 240 hour(s))  Resp Panel by RT-PCR (Flu A&B, Covid) Anterior Nasal Swab     Status: None   Collection Time: 03/19/22  9:28 PM   Specimen: Anterior Nasal Swab  Result Value Ref Range Status   SARS Coronavirus 2 by RT PCR NEGATIVE NEGATIVE Final    Comment:  (NOTE) SARS-CoV-2 target nucleic acids are NOT DETECTED.  The SARS-CoV-2 RNA is generally detectable in upper respiratory specimens during the acute phase of infection. The lowest concentration of SARS-CoV-2 viral copies this assay can detect is 138 copies/mL. A negative result does not preclude SARS-Cov-2 infection and should not be used as the sole basis for treatment or other patient management decisions. A negative result may occur with  improper specimen collection/handling, submission of specimen other than nasopharyngeal swab, presence of viral mutation(s) within the areas targeted by this assay, and inadequate number of viral copies(<138 copies/mL). A negative result must be combined with clinical observations, patient history, and epidemiological information. The expected result is Negative.  Fact Sheet for Patients:  EntrepreneurPulse.com.au  Fact Sheet for Healthcare Providers:  IncredibleEmployment.be  This test is no t yet approved or cleared by the [place] FDA and  has been authorized for detection and/or diagnosis of SARS-CoV-2 by FDA under an Emergency Use Authorization (EUA). This EUA will remain  in effect (meaning this test can be used) for the duration of the COVID-19 declaration under Section 564(b)(1) of the Act, 21 U.S.C.section 360bbb-3(b)(1), unless the authorization is terminated  or revoked sooner.       Influenza A by PCR NEGATIVE NEGATIVE Final   Influenza B by PCR NEGATIVE NEGATIVE Final    Comment: (NOTE) The Xpert Xpress SARS-CoV-2/FLU/RSV plus assay is intended as an aid in the diagnosis of influenza from Nasopharyngeal swab specimens and should not be used as a sole basis for treatment. Nasal washings and aspirates are unacceptable for Xpert Xpress SARS-CoV-2/FLU/RSV testing.  Fact Sheet for Patients: EntrepreneurPulse.com.au  Fact Sheet for Healthcare  Providers: IncredibleEmployment.be  This test is not yet approved or cleared by the [place] FDA and has been authorized for detection and/or diagnosis of SARS-CoV-2 by FDA under an Emergency Use Authorization (EUA). This EUA will remain in effect (meaning this test can be used) for the duration of the COVID-19 declaration under Section 564(b)(1) of the Act, 21 U.S.C. section 360bbb-3(b)(1), unless the authorization is terminated or revoked.  Performed at [facility]  [facility], [address] [place], [place] [address]   Blood Culture (routine x 2)     Status: Abnormal (Preliminary result)   Collection Time: 03/19/22  9:28 PM   Specimen: BLOOD  Result Value Ref Range Status   Specimen Description   Final    BLOOD RIGHT ANTECUBITAL Performed at [facility], [address], [place], [place] [address]    Special Requests   Final    BOTTLES DRAWN AEROBIC AND ANAEROBIC Blood Culture adequate volume Performed at [facility], [address] [place], [place] [address]    Culture  Setup Time   Final    GRAM POSITIVE COCCI IN BOTH AEROBIC AND ANAEROBIC BOTTLES CRITICAL RESULT CALLED TO, READ BACK BY AND VERIFIED WITH: CAROLYN CHILDS 03/20/22 1457 AMK Performed at [facility], [address], [place],  [address]    Culture STAPHYLOCOCCUS CAPITIS (A)  Final   Report Status PENDING  Incomplete   Organism ID, Bacteria STAPHYLOCOCCUS CAPITIS  Final      Susceptibility   Staphylococcus capitis - MIC*    CIPROFLOXACIN <=0.5 SENSITIVE Sensitive     ERYTHROMYCIN <=0.25 SENSITIVE Sensitive     GENTAMICIN <=0.5 SENSITIVE Sensitive     OXACILLIN <=0.25 SENSITIVE Sensitive     TETRACYCLINE <=1 SENSITIVE Sensitive     VANCOMYCIN <=0.5 SENSITIVE Sensitive     TRIMETH/SULFA <=10 SENSITIVE Sensitive     CLINDAMYCIN <=0.25 SENSITIVE Sensitive     RIFAMPIN <=0.5 SENSITIVE Sensitive     Inducible Clindamycin NEGATIVE Sensitive     *  STAPHYLOCOCCUS CAPITIS  Blood Culture ID Panel (Reflexed)     Status: Abnormal   Collection Time: 03/19/22  9:28 PM  Result Value Ref Range Status   Enterococcus faecalis NOT DETECTED NOT DETECTED Final   Enterococcus Faecium NOT DETECTED NOT DETECTED Final   Listeria monocytogenes NOT DETECTED NOT DETECTED Final   Staphylococcus species DETECTED (A) NOT DETECTED Final    Comment: CRITICAL RESULT CALLED TO, READ BACK BY AND VERIFIED WITH: CAROLYN CHILDS 03/20/22 1457 AMK    Staphylococcus aureus (BCID) NOT DETECTED NOT DETECTED Final   Staphylococcus epidermidis NOT DETECTED NOT DETECTED Final   Staphylococcus lugdunensis NOT DETECTED NOT DETECTED Final   Streptococcus species NOT DETECTED NOT DETECTED Final   Streptococcus agalactiae NOT DETECTED NOT DETECTED Final   Streptococcus pneumoniae NOT DETECTED NOT DETECTED Final   Streptococcus pyogenes NOT DETECTED NOT DETECTED Final   A.calcoaceticus-baumannii NOT DETECTED NOT DETECTED Final   Bacteroides fragilis NOT DETECTED NOT DETECTED Final   Enterobacterales NOT DETECTED NOT DETECTED Final   Enterobacter cloacae complex NOT DETECTED NOT DETECTED Final   Escherichia coli NOT DETECTED NOT DETECTED Final   Klebsiella aerogenes NOT DETECTED NOT DETECTED Final   Klebsiella oxytoca NOT DETECTED NOT DETECTED Final   Klebsiella pneumoniae NOT DETECTED NOT DETECTED Final   Proteus species NOT DETECTED NOT DETECTED Final   Salmonella species NOT DETECTED NOT DETECTED Final   Serratia marcescens NOT DETECTED NOT DETECTED Final   Haemophilus influenzae NOT DETECTED NOT DETECTED Final   Neisseria meningitidis NOT DETECTED NOT DETECTED Final   Pseudomonas aeruginosa NOT DETECTED NOT DETECTED Final   Stenotrophomonas maltophilia NOT DETECTED NOT DETECTED Final   Candida albicans NOT DETECTED NOT DETECTED Final   Candida auris NOT DETECTED NOT DETECTED Final   Candida glabrata NOT DETECTED NOT DETECTED Final   Candida krusei NOT DETECTED NOT  DETECTED Final   Candida parapsilosis NOT DETECTED NOT DETECTED Final   Candida  tropicalis NOT DETECTED NOT DETECTED Final   Cryptococcus neoformans/gattii NOT DETECTED NOT DETECTED Final    Comment: Performed at [facility], [address] [place], [place] [address]  Blood Culture (routine x 2)     Status: Abnormal   Collection Time: 03/19/22 10:23 PM   Specimen: BLOOD  Result Value Ref Range Status   Specimen Description   Final    BLOOD BLOOD RIGHT FOREARM Performed at [facility], [address], [place], [place] [address]    Special Requests   Final    BOTTLES DRAWN AEROBIC AND ANAEROBIC Blood Culture adequate volume Performed at [facility], [address] [place], [place] [address]    Culture  Setup Time   Final    GRAM POSITIVE COCCI IN BOTH AEROBIC AND ANAEROBIC BOTTLES CRITICAL RESULT CALLED TO, READ BACK BY AND VERIFIED WITH: ELVERA MADUEME 03/20/22 1545 AMK Performed at [facility], [address] [place], [place] [address]    Culture (A)  Final    STAPHYLOCOCCUS CAPITIS SUSCEPTIBILITIES PERFORMED ON PREVIOUS CULTURE WITHIN THE LAST 5 DAYS. Performed at [facility], [address], [place], [place] [address]    Report Status 03/22/2022 FINAL  Final  Urine Culture     Status: Abnormal   Collection Time: 03/20/22  1:25 AM   Specimen: In/Out Cath Urine  Result Value Ref Range Status   Specimen Description   Final    IN/OUT CATH URINE Performed at [facility], [address] [place], [place] [address]    Special Requests   Final    NONE Performed at [facility], [address] [place], [place] [address]    Culture 4,000 COLONIES/mL STAPHYLOCOCCUS EPIDERMIDIS (A)  Final   Report Status 03/22/2022 FINAL  Final   Organism ID, Bacteria STAPHYLOCOCCUS EPIDERMIDIS (A)  Final      Susceptibility   Staphylococcus epidermidis - MIC*    CIPROFLOXACIN <=0.5 SENSITIVE Sensitive     GENTAMICIN  <=0.5 SENSITIVE Sensitive     NITROFURANTOIN <=16 SENSITIVE Sensitive     OXACILLIN <=0.25 SENSITIVE Sensitive     TETRACYCLINE <=1 SENSITIVE Sensitive     VANCOMYCIN 2 SENSITIVE Sensitive     TRIMETH/SULFA 80 RESISTANT Resistant     CLINDAMYCIN >=8 RESISTANT Resistant     RIFAMPIN <=0.5 SENSITIVE Sensitive     Inducible Clindamycin NEGATIVE Sensitive     * 4,000 COLONIES/mL STAPHYLOCOCCUS EPIDERMIDIS  Culture, blood (Routine X 2) w Reflex to ID Panel     Status: None (Preliminary result)   Collection Time: 03/21/22 11:00 AM   Specimen: BLOOD  Result Value Ref Range Status   Specimen Description BLOOD BLOOD RIGHT FOREARM  Final   Special Requests   Final    BOTTLES DRAWN AEROBIC AND ANAEROBIC Blood Culture adequate volume   Culture   Final    NO GROWTH 2 DAYS Performed at [facility], [address], [place], [place] [address]    Report Status PENDING  Incomplete  Culture, blood (Routine X 2) w Reflex to ID Panel     Status: None (Preliminary result)   Collection Time: 03/22/22 11:53 AM   Specimen: BLOOD RIGHT HAND  Result Value Ref Range Status   Specimen Description BLOOD RIGHT HAND  Final   Special Requests   Final    BOTTLES DRAWN AEROBIC AND ANAEROBIC Blood Culture adequate volume   Culture   Final    NO GROWTH < 24 HOURS Performed at [facility], [address], [place], [place] [address]    Report Status PENDING  Incomplete         Radiology Studies: ECHOCARDIOGRAM COMPLETE  Result Date: 03/23/2022    ECHOCARDIOGRAM REPORT   Patient Name:   SHERIL HAMMOND Date of Exam: 03/22/2022 Medical Rec #:  [id number]              Height:       69.0 in Accession #:    [id number]             Weight:       256.4 lb Date of Birth:  16-Dec-1950               BSA:          2.296 m Patient Age:    62 years               BP:           131/80 mmHg Patient Gender: F                      HR:           67 bpm. Exam Location:  [facility] Procedure: 2D Echo, Cardiac  Doppler and Color Doppler Indications:     Pulmonary Embolus I26.09  History:         Patient has prior history of Echocardiogram examinations, most                  recent 01/01/2019. COPD, Signs/Symptoms:Murmur; Risk                  Factors:Diabetes and Hypertension.  Sonographer:     Sherrie Sport Referring Phys:  [id number] JAN A MANSY Diagnosing Phys: Yolonda Kida MD  Sonographer Comments: Suboptimal apical window. IMPRESSIONS  1. TDS.  2. Left ventricular ejection fraction, by estimation, is 50 to 55%. The left ventricle has normal function. The left ventricle has no regional wall motion abnormalities. The left ventricular internal cavity size was mildly dilated. Left ventricular diastolic parameters are consistent with Grade II diastolic dysfunction (pseudonormalization).  3. Right ventricular systolic function is normal. The right ventricular size is normal.  4. Left atrial size was mildly dilated.  5. Right atrial size was mildly dilated.  6. The mitral valve is normal in structure. Trivial mitral valve regurgitation.  7. The aortic valve is normal in structure. Aortic valve regurgitation is not visualized. FINDINGS  Left Ventricle: Left ventricular ejection fraction, by estimation, is 50 to 55%. The left ventricle has normal function. The left ventricle has no regional wall motion abnormalities. The left ventricular internal cavity size was mildly dilated. There is  no left ventricular hypertrophy. Left ventricular diastolic parameters are consistent with Grade II diastolic dysfunction (pseudonormalization). Right Ventricle: The right ventricular size is normal. No increase in right ventricular wall thickness. Right ventricular systolic function is normal. Left Atrium: Left atrial size was mildly dilated. Right Atrium: Right atrial size was mildly dilated. Pericardium: There is no evidence of pericardial effusion. Mitral Valve: The mitral valve is normal in structure. Trivial mitral valve regurgitation.  Tricuspid Valve: The tricuspid valve is normal in structure. Tricuspid valve regurgitation is mild. Aortic Valve: The aortic valve is normal in structure. Aortic valve regurgitation is not visualized. Aortic valve mean gradient measures 4.7 mmHg. Aortic valve peak gradient measures 9.3 mmHg. Aortic valve area, by VTI measures 1.79 cm. Pulmonic Valve: The pulmonic valve was normal in  structure. Pulmonic valve regurgitation is not visualized. Aorta: The ascending aorta was not well visualized. IAS/Shunts: No atrial level shunt detected by color flow Doppler. Additional Comments: TDS.  LEFT VENTRICLE PLAX 2D LVIDd:         5.00 cm   Diastology LVIDs:         3.60 cm   LV e' medial:    5.77 cm/s LV PW:         1.10 cm   LV E/e' medial:  24.1 LV IVS:        1.10 cm   LV e' lateral:   6.96 cm/s LVOT diam:     2.00 cm   LV E/e' lateral: 20.0 LV SV:         56 LV SV Index:   24 LVOT Area:     3.14 cm  RIGHT VENTRICLE RV Basal diam:  4.20 cm RV S prime:     13.90 cm/s TAPSE (M-mode): 3.2 cm LEFT ATRIUM              Index        RIGHT ATRIUM           Index LA diam:        4.40 cm  1.92 cm/m   RA Area:     19.70 cm LA Vol (A2C):   117.0 ml 50.96 ml/m  RA Volume:   57.10 ml  24.87 ml/m LA Vol (A4C):   68.9 ml  30.01 ml/m LA Biplane Vol: 97.9 ml  42.64 ml/m  AORTIC VALVE AV Area (Vmax):    1.58 cm AV Area (Vmean):   1.73 cm AV Area (VTI):     1.79 cm AV Vmax:           152.67 cm/s AV Vmean:          98.300 cm/s AV VTI:            0.311 m AV Peak Grad:      9.3 mmHg AV Mean Grad:      4.7 mmHg LVOT Vmax:         77.00 cm/s LVOT Vmean:        54.200 cm/s LVOT VTI:          0.177 m LVOT/AV VTI ratio: 0.57  AORTA Ao Root diam: 2.47 cm MITRAL VALVE                TRICUSPID VALVE MV Area (PHT): 2.63 cm     TR Peak grad:   35.3 mmHg MV Decel Time: 288 msec     TR Vmax:        297.00 cm/s MV E velocity: 139.00 cm/s MV A velocity: 92.50 cm/s   SHUNTS MV E/A ratio:  1.50         Systemic VTI:  0.18 m                              Systemic Diam: 2.00 cm Dwayne D Callwood MD Electronically signed by Yolonda Kida MD Signature Date/Time: 03/23/2022/8:00:35 AM    Final         Scheduled Meds:  apixaban  10 mg Oral BID   Followed by   Derrill Memo ON 03/29/2022] apixaban  5 mg Oral BID   atorvastatin  80 mg Oral QPM   calcium acetate  2,001 mg Oral TID WC   Chlorhexidine Gluconate Cloth  6 each Topical Q0600  vitamin D3  1,000 Units Oral Daily   cinacalcet  30 mg Oral Q breakfast   docusate sodium  200 mg Oral BID   fluticasone  1 spray Each Nare Daily   gabapentin  100 mg Oral BID   insulin aspart  0-6 Units Subcutaneous TID WC   insulin aspart protamine- aspart  10 Units Subcutaneous BID WC   ipratropium-albuterol  3 mL Nebulization BID   levETIRAcetam  1,000 mg Oral Daily   levETIRAcetam  250 mg Oral Once per day on Mon Wed Fri   losartan  100 mg Oral QHS   metoprolol succinate  100 mg Oral Q breakfast   pantoprazole  40 mg Oral Daily   polyethylene glycol  17 g Oral Daily   torsemide  100 mg Oral Q breakfast   Continuous Infusions:  sodium chloride 10 mL/hr at 03/21/22 1840   anticoagulant sodium citrate     cefTRIAXone (ROCEPHIN)  IV Stopped (03/22/22 1152)   doxycycline (VIBRAMYCIN) IV 100 mg (03/23/22 0420)     LOS: 3 days    Time spent: 35 mins     Nolberto Hanlon, MD [facility] Pager [phone]  If 7PM-7AM, please contact night-coverage [email or web address] 03/23/2022, 8:26 AM

## 2022-03-23 NOTE — Progress Notes (Signed)
Physical Therapy Treatment Patient Details Name: Cynthia Dean MRN: [id number] DOB: [birth year] Today's Date: 03/23/2022   History of Present Illness Cynthia Dean is a 71 y.o. African-American female with medical history significant for COPD, carotid stenosis, type 2 diabetes mellitus, end-stage renal disease on hemodialysis on MWF, hypertension and dyslipidemia, as well as invasive ductal carcinoma of the left breast status post surgical excision at [facility] recently, who presented to the emergency room with acute onset of generalized weakness and fatigue for the last couple of days.    PT Comments    Pt received in care of RN and nursing student. Agreeable to PT. Tolerating LE therex while RN readying meds at bedside. Overall 10 minutes of session spent in RN care and MD at bedside that is not being billed this session.  Min to mod TC's throughout all therex for good carryover to adequately target specifics musculature for strengthening. Pt transferring supervision and bed features to EOB with increased time and standing to RW minguard ambulating ~3' at bedside to transfer to recliner. Poor gluteal activation noted with anterior trunk lean and BUE's needed heavily on RW for support. Intermittent need for minA on RW for sequencing and RLE foot clearance which pt reports RLE weaker than LLE with difficulty managing limb as pt begins to sit in recliner. Pt with good control with descent and hand placement. All needs in reach with d/c recs remaining appropriate.    Recommendations for follow up therapy are one component of a multi-disciplinary discharge planning process, led by the attending physician.  Recommendations may be updated based on patient status, additional functional criteria and insurance authorization.  Follow Up Recommendations  Skilled nursing-short term rehab (<3 hours/day) Can patient physically be transported by private vehicle: No   Assistance Recommended at Discharge  Frequent or constant Supervision/Assistance  Patient can return home with the following A little help with bathing/dressing/bathroom;Help with stairs or ramp for entrance;Assist for transportation;A lot of help with walking and/or transfers   Equipment Recommendations  Other (comment) (tbd by next venue of care)    Recommendations for Other Services       Precautions / Restrictions Precautions Precautions: Fall Restrictions Weight Bearing Restrictions: No     Mobility  Bed Mobility Overal bed mobility: Needs Assistance Bed Mobility: Supine to Sit     Supine to sit: HOB elevated, Supervision     General bed mobility comments: increased time and use of bed features Patient Response: Cooperative  Transfers Overall transfer level: Needs assistance Equipment used: Rolling walker (2 wheels) Transfers: Sit to/from Stand Sit to Stand: Min guard           General transfer comment: VC's for hand placement with good carryover    Ambulation/Gait Ambulation/Gait assistance: Min assist, Min guard Gait Distance (Feet): 3 Feet Assistive device: Rolling walker (2 wheels) Gait Pattern/deviations: Step-to pattern       General Gait Details: Ambulated at EOB to recliner. Increased time and minA on RW for sequencing for turns. RLE appears fatigued with pt dififculty mobilizing limb even with pivots as pt gets close to recliner.   Stairs             Wheelchair Mobility    Modified Rankin (Stroke Patients Only)       Balance Overall balance assessment: Needs assistance   Sitting balance-Leahy Scale: Good     Standing balance support: During functional activity, Reliant on assistive device for balance Standing balance-Leahy Scale: Poor Standing balance comment: Heavy BUE  support on RW with poor gluteal activation                            Cognition Arousal/Alertness: Awake/alert Behavior During Therapy: WFL for tasks assessed/performed Overall  Cognitive Status: Difficult to assess                                          Exercises General Exercises - Lower Extremity Ankle Circles/Pumps: AROM, Strengthening, Both, 10 reps, Supine Short Arc Quad: AROM, Strengthening, Both, 10 reps, Supine Hip ABduction/ADduction: AROM, Strengthening, Both, 10 reps, Supine Other Exercises Other Exercises: safe use of DME to prevent falls.    General Comments        Pertinent Vitals/Pain Pain Assessment Pain Assessment: Faces Faces Pain Scale: Hurts a little bit Pain Location: buttocks Pain Descriptors / Indicators: Discomfort, Dull Pain Intervention(s): Limited activity within patient's tolerance, Monitored during session, Repositioned    Home Living                          Prior Function            PT Goals (current goals can now be found in the care plan section) Acute Rehab PT Goals Patient Stated Goal: improve strength and feel better PT Goal Formulation: With patient Time For Goal Achievement: 04/04/22 Potential to Achieve Goals: Fair Progress towards PT goals: Progressing toward goals    Frequency    Min 2X/week      PT Plan Current plan remains appropriate    Co-evaluation              AM-PAC PT "6 Clicks" Mobility   Outcome Measure  Help needed turning from your back to your side while in a flat bed without using bedrails?: A Little Help needed moving from lying on your back to sitting on the side of a flat bed without using bedrails?: A Lot Help needed moving to and from a bed to a chair (including a wheelchair)?: A Lot Help needed standing up from a chair using your arms (e.g., wheelchair or bedside chair)?: A Little Help needed to walk in hospital room?: A Lot Help needed climbing 3-5 steps with a railing? : Total 6 Click Score: 13    End of Session Equipment Utilized During Treatment: Gait belt;Oxygen Activity Tolerance: Patient limited by fatigue Patient left: in  chair;with call bell/phone within reach;with chair alarm set Nurse Communication: Mobility status PT Visit Diagnosis: Other abnormalities of gait and mobility (R26.89);Muscle weakness (generalized) (M62.81);Difficulty in walking, not elsewhere classified (R26.2)     Time: 7939-0300 PT Time Calculation (min) (ACUTE ONLY): 36 min  Charges:  $Therapeutic Exercise: 8-22 mins $Therapeutic Activity: 8-22 mins                     Lovelace Cerveny M. Fairly IV, PT, DPT Physical Therapist- [facility]  03/23/2022, 10:46 AM

## 2022-03-23 NOTE — Progress Notes (Signed)
   03/23/22 0030  What Happened  Was fall witnessed? Yes  Who witnessed fall? Grover Canavan, RN  Patients activity before fall bathroom-assisted  Point of contact buttocks  Was patient injured? No  Follow Up  MD notified Neomia Glass  Time MD notified 62  Family notified Yes - comment (Family called; no answer)  Time family notified [phone]  Additional tests No  Adult Fall Risk Assessment  Risk Factor Category (scoring not indicated) Fall has occurred during this admission (document High fall risk);High fall risk per protocol (document High fall risk)  Age 71  Fall History: Fall within 6 months prior to admission 0  Elimination; Bowel and/or Urine Incontinence 0  Elimination; Bowel and/or Urine Urgency/Frequency 0  Medications: includes PCA/Opiates, Anti-convulsants, Anti-hypertensives, Diuretics, Hypnotics, Laxatives, Sedatives, and Psychotropics 5  Patient Care Equipment 2  Mobility-Assistance 2  Mobility-Gait 2  Mobility-Sensory Deficit 0  Altered awareness of immediate physical environment 1  Impulsiveness 0  Lack of understanding of one's physical/cognitive limitations 4  Total Score 18  Patient Fall Risk Level High fall risk  Adult Fall Risk Interventions  Required Bundle Interventions *See Row Information* High fall risk - low, moderate, and high requirements implemented  Additional Interventions Use of appropriate toileting equipment (bedpan, BSC, etc.)  Screening for Fall Injury Risk (To be completed on HIGH fall risk patients) - Assessing Need for Floor Mats  Risk For Fall Injury- Criteria for Floor Mats Bleeding risk-anticoagulation (not prophylaxis);Confusion/dementia (+NuDESC, CIWA, TBI, etc.)  Will Implement Floor Mats Yes  Vitals  Temp 98.9 F (37.2 C)  Temp Source Oral  BP (!) 150/50  MAP (mmHg) 79  BP Location Right Arm  BP Method Automatic  Patient Position (if appropriate) Lying  ECG Heart Rate 63  Resp 15  Oxygen Therapy  SpO2 97 %  O2 Device Nasal  Cannula  O2 Flow Rate (L/min) 2 L/min  Pain Assessment  Pain Score 1  Neurological  Neuro (WDL) X  Orientation Level Oriented to person;Disoriented to place;Disoriented to time;Disoriented to situation  Cognition Memory impairment;Poor safety awareness;Poor judgement  Glasgow Coma Scale  Eye Opening 4  Best Verbal Response (NON-intubated) 4  Best Motor Response 6  Glasgow Coma Scale Score 14  Musculoskeletal  Musculoskeletal (WDL) X  Generalized Weakness Yes  Integumentary  Integumentary (WDL) WDL  Skin Color Appropriate for ethnicity  Skin Condition Dry

## 2022-03-23 NOTE — Care Management Important Message (Signed)
Important Message  Patient Details  Name: Cynthia Dean MRN: [id number] Date of Birth: 11-04-1950   Medicare Important Message Given:  N/A - LOS <3 / Initial given by admissions     [name] 03/23/2022, 8:35 AM

## 2022-03-23 NOTE — Progress Notes (Signed)
[facility]  ROUNDING NOTE   Subjective:   Cynthia Dean is a [age]-year-old female with past medical history including COPD, diabetes, carotid stenosis, hypertension, and end-stage renal disease on hemodialysis.  Patient presents to the emergency department with complaints of weakness, fatigue and fever.  Patient has been admitted for ESRD on hemodialysis ([place]) [N18.6, Z99.2] Acute pulmonary embolism without acute cor pulmonale ([place]) [I26.99] Sepsis with acute hypoxic respiratory failure and septic shock, due to unspecified organism ([place]) [A41.9, R65.21, J96.01]  Patient is known to our practice and receives outpatient dialysis treatments at [facility] on a TTS schedule, supervised by Dr. Candiss Norse.   Patient seen resting quietly, no family at bedside Patient continues to have some confusion this morning, asking why is not she home, easily redirected Awaiting breakfast Mild lower extremity edema Patient seen later in the morning with attending.  Patient seen sitting in chair with family at bedside  Objective:  Vital signs in last 24 hours:  Temp:  [97.7 F (36.5 C)-98.9 F (37.2 C)] 98 F (36.7 C) (08/30 1810) Pulse Rate:  [60-87] 65 (08/30 1814) Resp:  [13-22] 15 (08/30 1814) BP: (123-182)/(50-83) 168/83 (08/30 1814) SpO2:  [94 %-100 %] 100 % (08/30 1814) FiO2 (%):  [4 %] 4 % (08/30 1135) Weight:  [122.5 kg] 122.5 kg (08/30 1814)  Weight change:  Filed Weights   03/21/22 2239 03/22/22 0448 03/23/22 1814  Weight: 123 kg 116.3 kg 122.5 kg    Intake/Output: I/O last 3 completed shifts: In: 627.1 [I.V.:432.1; IV Piggyback:195] Out: 2000 [Other:2000]   Intake/Output this shift:  Total I/O In: -  Out: 2000 [Other:2000]  Physical Exam: General: NAD, resting comfortably  Head: Normocephalic, atraumatic. Moist oral mucosal membranes  Eyes: Anicteric  Lungs:  Clear to auscultation, normal effort  Heart: Regular rate and rhythm  Abdomen:   Soft, nontender, obese  Extremities: 1+ peripheral edema.  Neurologic: Alert, oriented to person  Skin: No lesions  Access: Left aVF    Basic Metabolic Panel: Recent Labs  Lab 03/19/22 2127 03/20/22 0448 03/21/22 0655 03/22/22 0156 03/23/22 0647  NA 138 136 135 136 137  K 4.3 3.8 4.6 3.6 4.4  CL 100 109 101 98 101  CO2 24 21* '22 26 26  '$ GLUCOSE 94 105* 84 91 101*  BUN 48* 47* 60* 32* 38*  CREATININE 11.43* 9.97* 12.47* 7.57* 9.31*  CALCIUM 9.6 7.9* 8.7* 8.7* 9.5     Liver Function Tests: Recent Labs  Lab 03/19/22 2127  AST 15  ALT 7  ALKPHOS 89  BILITOT 1.2  PROT 7.2  ALBUMIN 3.4*    No results for input(s): "LIPASE", "AMYLASE" in the last 168 hours. No results for input(s): "AMMONIA" in the last 168 hours.  CBC: Recent Labs  Lab 03/19/22 2127 03/20/22 0448 03/20/22 1901 03/21/22 0655 03/21/22 1847 03/22/22 0156 03/23/22 0647  WBC 16.5* 14.4*  --  9.6  --  9.7 10.4  NEUTROABS 13.0*  --   --   --   --   --   --   HGB 8.1* 6.6* 6.9* 7.1* 7.5* 8.0* 7.5*  HCT 26.1* 21.8* 22.3* 22.7* 23.8* 25.3* 24.3*  MCV 90.6 93.2  --  87.3  --  86.6 87.7  PLT 214 163  --  168  --  210 235     Cardiac Enzymes: No results for input(s): "CKTOTAL", "CKMB", "CKMBINDEX", "TROPONINI" in the last 168 hours.  BNP: Invalid input(s): "POCBNP"  CBG: Recent Labs  Lab  03/22/22 1140 03/22/22 1619 03/22/22 2011 03/23/22 0743 03/23/22 1152  GLUCAP 148* 133* 104* 108* 113*     Microbiology: Results for orders placed or performed during the hospital encounter of 03/19/22  Resp Panel by RT-PCR (Flu A&B, Covid) Anterior Nasal Swab     Status: None   Collection Time: 03/19/22  9:28 PM   Specimen: Anterior Nasal Swab  Result Value Ref Range Status   SARS Coronavirus 2 by RT PCR NEGATIVE NEGATIVE Final    Comment: (NOTE) SARS-CoV-2 target nucleic acids are NOT DETECTED.  The SARS-CoV-2 RNA is generally detectable in upper respiratory specimens during the acute phase of  infection. The lowest concentration of SARS-CoV-2 viral copies this assay can detect is 138 copies/mL. A negative result does not preclude SARS-Cov-2 infection and should not be used as the sole basis for treatment or other patient management decisions. A negative result may occur with  improper specimen collection/handling, submission of specimen other than nasopharyngeal swab, presence of viral mutation(s) within the areas targeted by this assay, and inadequate number of viral copies(<138 copies/mL). A negative result must be combined with clinical observations, patient history, and epidemiological information. The expected result is Negative.  Fact Sheet for Patients:  EntrepreneurPulse.com.au  Fact Sheet for Healthcare Providers:  IncredibleEmployment.be  This test is no t yet approved or cleared by the [place] FDA and  has been authorized for detection and/or diagnosis of SARS-CoV-2 by FDA under an Emergency Use Authorization (EUA). This EUA will remain  in effect (meaning this test can be used) for the duration of the COVID-19 declaration under Section 564(b)(1) of the Act, 21 U.S.C.section 360bbb-3(b)(1), unless the authorization is terminated  or revoked sooner.       Influenza A by PCR NEGATIVE NEGATIVE Final   Influenza B by PCR NEGATIVE NEGATIVE Final    Comment: (NOTE) The Xpert Xpress SARS-CoV-2/FLU/RSV plus assay is intended as an aid in the diagnosis of influenza from Nasopharyngeal swab specimens and should not be used as a sole basis for treatment. Nasal washings and aspirates are unacceptable for Xpert Xpress SARS-CoV-2/FLU/RSV testing.  Fact Sheet for Patients: EntrepreneurPulse.com.au  Fact Sheet for Healthcare Providers: IncredibleEmployment.be  This test is not yet approved or cleared by the [place] FDA and has been authorized for detection and/or diagnosis of SARS-CoV-2  by FDA under an Emergency Use Authorization (EUA). This EUA will remain in effect (meaning this test can be used) for the duration of the COVID-19 declaration under Section 564(b)(1) of the Act, 21 U.S.C. section 360bbb-3(b)(1), unless the authorization is terminated or revoked.  Performed at [facility], [address] [place], [place] [address]   Blood Culture (routine x 2)     Status: Abnormal   Collection Time: 03/19/22  9:28 PM   Specimen: BLOOD  Result Value Ref Range Status   Specimen Description   Final    BLOOD RIGHT ANTECUBITAL Performed at [facility], [address] [place], [place] [address]    Special Requests   Final    BOTTLES DRAWN AEROBIC AND ANAEROBIC Blood Culture adequate volume Performed at [facility], [address] [place], [place] [address]    Culture  Setup Time   Final    GRAM POSITIVE COCCI IN BOTH AEROBIC AND ANAEROBIC BOTTLES CRITICAL RESULT CALLED TO, READ BACK BY AND VERIFIED WITH: CAROLYN CHILDS 03/20/22 1457 AMK Performed at [facility], [address], [place], [place] [address]    Culture STAPHYLOCOCCUS CAPITIS (A)  Final   Report Status 03/23/2022 FINAL  Final   Organism ID, Bacteria STAPHYLOCOCCUS CAPITIS  Final      Susceptibility   Staphylococcus capitis - MIC*    CIPROFLOXACIN <=0.5 SENSITIVE Sensitive     ERYTHROMYCIN <=0.25 SENSITIVE Sensitive     GENTAMICIN <=0.5 SENSITIVE Sensitive     OXACILLIN <=0.25 SENSITIVE Sensitive     TETRACYCLINE <=1 SENSITIVE Sensitive     VANCOMYCIN <=0.5 SENSITIVE Sensitive     TRIMETH/SULFA <=10 SENSITIVE Sensitive     CLINDAMYCIN <=0.25 SENSITIVE Sensitive     RIFAMPIN <=0.5 SENSITIVE Sensitive     Inducible Clindamycin NEGATIVE Sensitive     * STAPHYLOCOCCUS CAPITIS  Blood Culture ID Panel (Reflexed)     Status: Abnormal   Collection Time: 03/19/22  9:28 PM  Result Value Ref Range Status   Enterococcus faecalis NOT DETECTED NOT DETECTED Final    Enterococcus Faecium NOT DETECTED NOT DETECTED Final   Listeria monocytogenes NOT DETECTED NOT DETECTED Final   Staphylococcus species DETECTED (A) NOT DETECTED Final    Comment: CRITICAL RESULT CALLED TO, READ BACK BY AND VERIFIED WITH: CAROLYN CHILDS 03/20/22 1457 AMK    Staphylococcus aureus (BCID) NOT DETECTED NOT DETECTED Final   Staphylococcus epidermidis NOT DETECTED NOT DETECTED Final   Staphylococcus lugdunensis NOT DETECTED NOT DETECTED Final   Streptococcus species NOT DETECTED NOT DETECTED Final   Streptococcus agalactiae NOT DETECTED NOT DETECTED Final   Streptococcus pneumoniae NOT DETECTED NOT DETECTED Final   Streptococcus pyogenes NOT DETECTED NOT DETECTED Final   A.calcoaceticus-baumannii NOT DETECTED NOT DETECTED Final   Bacteroides fragilis NOT DETECTED NOT DETECTED Final   Enterobacterales NOT DETECTED NOT DETECTED Final   Enterobacter cloacae complex NOT DETECTED NOT DETECTED Final   Escherichia coli NOT DETECTED NOT DETECTED Final   Klebsiella aerogenes NOT DETECTED NOT DETECTED Final   Klebsiella oxytoca NOT DETECTED NOT DETECTED Final   Klebsiella pneumoniae NOT DETECTED NOT DETECTED Final   Proteus species NOT DETECTED NOT DETECTED Final   Salmonella species NOT DETECTED NOT DETECTED Final   Serratia marcescens NOT DETECTED NOT DETECTED Final   Haemophilus influenzae NOT DETECTED NOT DETECTED Final   Neisseria meningitidis NOT DETECTED NOT DETECTED Final   Pseudomonas aeruginosa NOT DETECTED NOT DETECTED Final   Stenotrophomonas maltophilia NOT DETECTED NOT DETECTED Final   Candida albicans NOT DETECTED NOT DETECTED Final   Candida auris NOT DETECTED NOT DETECTED Final   Candida glabrata NOT DETECTED NOT DETECTED Final   Candida krusei NOT DETECTED NOT DETECTED Final   Candida parapsilosis NOT DETECTED NOT DETECTED Final   Candida tropicalis NOT DETECTED NOT DETECTED Final   Cryptococcus neoformans/gattii NOT DETECTED NOT DETECTED Final    Comment:  Performed at [facility], [address] [place], [place] [address]  Blood Culture (routine x 2)     Status: Abnormal   Collection Time: 03/19/22 10:23 PM   Specimen: BLOOD  Result Value Ref Range Status   Specimen Description   Final    BLOOD BLOOD RIGHT FOREARM Performed at [facility], [address], [place], [place] [address]    Special Requests   Final    BOTTLES DRAWN AEROBIC AND ANAEROBIC Blood Culture adequate volume Performed at [facility], [address] [place], [place] [address]    Culture  Setup Time   Final    GRAM POSITIVE COCCI IN BOTH AEROBIC AND ANAEROBIC BOTTLES CRITICAL RESULT CALLED TO, READ BACK BY AND VERIFIED WITH: ELVERA MADUEME 03/20/22 1545  AMK Performed at [facility], [address] [place], [place] [address]    Culture (A)  Final    STAPHYLOCOCCUS CAPITIS SUSCEPTIBILITIES PERFORMED ON PREVIOUS CULTURE WITHIN THE LAST 5 DAYS. Performed at [facility], [place] [address], [place], [place] [address]    Report Status 03/22/2022 FINAL  Final  Urine Culture     Status: Abnormal   Collection Time: 03/20/22  1:25 AM   Specimen: In/Out Cath Urine  Result Value Ref Range Status   Specimen Description   Final    IN/OUT CATH URINE Performed at [facility], [address] [place], [place] [address]    Special Requests   Final    NONE Performed at [facility], [address] [place], [place] [address]    Culture 4,000 COLONIES/mL STAPHYLOCOCCUS EPIDERMIDIS (A)  Final   Report Status 03/22/2022 FINAL  Final   Organism ID, Bacteria STAPHYLOCOCCUS EPIDERMIDIS (A)  Final      Susceptibility   Staphylococcus epidermidis - MIC*    CIPROFLOXACIN <=0.5 SENSITIVE Sensitive     GENTAMICIN <=0.5 SENSITIVE Sensitive     NITROFURANTOIN <=16 SENSITIVE Sensitive     OXACILLIN <=0.25 SENSITIVE Sensitive     TETRACYCLINE <=1 SENSITIVE Sensitive     VANCOMYCIN 2 SENSITIVE Sensitive      TRIMETH/SULFA 80 RESISTANT Resistant     CLINDAMYCIN >=8 RESISTANT Resistant     RIFAMPIN <=0.5 SENSITIVE Sensitive     Inducible Clindamycin NEGATIVE Sensitive     * 4,000 COLONIES/mL STAPHYLOCOCCUS EPIDERMIDIS  Culture, blood (Routine X 2) w Reflex to ID Panel     Status: None (Preliminary result)   Collection Time: 03/21/22 11:00 AM   Specimen: BLOOD  Result Value Ref Range Status   Specimen Description BLOOD BLOOD RIGHT FOREARM  Final   Special Requests   Final    BOTTLES DRAWN AEROBIC AND ANAEROBIC Blood Culture adequate volume   Culture   Final    NO GROWTH 2 DAYS Performed at [facility], [address] [place], [place] [address]    Report Status PENDING  Incomplete  Culture, blood (Routine X 2) w Reflex to ID Panel     Status: None (Preliminary result)   Collection Time: 03/22/22 11:53 AM   Specimen: BLOOD RIGHT HAND  Result Value Ref Range Status   Specimen Description BLOOD RIGHT HAND  Final   Special Requests   Final    BOTTLES DRAWN AEROBIC AND ANAEROBIC Blood Culture adequate volume   Culture   Final    NO GROWTH < 24 HOURS Performed at [facility], [address] [place],  [address]    Report Status PENDING  Incomplete    Coagulation Studies: No results for input(s): "LABPROT", "INR" in the last 72 hours.   Urinalysis: No results for input(s): "COLORURINE", "LABSPEC", "PHURINE", "GLUCOSEU", "HGBUR", "BILIRUBINUR", "KETONESUR", "PROTEINUR", "UROBILINOGEN", "NITRITE", "LEUKOCYTESUR" in the last 72 hours.  Invalid input(s): "APPERANCEUR"     Imaging: ECHOCARDIOGRAM COMPLETE  Result Date: 03/23/2022    ECHOCARDIOGRAM REPORT   Patient Name:   Cynthia Dean Date of Exam: 03/22/2022 Medical Rec #:  [id number]              Height:       69.0 in Accession #:    [id number]             Weight:       256.4 lb Date of Birth:  [birth year]  BSA:          2.296 m Patient Age:    49 years               BP:           131/80  mmHg Patient Gender: F                      HR:           67 bpm. Exam Location:  [facility] Procedure: 2D Echo, Cardiac Doppler and Color Doppler Indications:     Pulmonary Embolus I26.09  History:         Patient has prior history of Echocardiogram examinations, most                  recent 01/01/2019. COPD, Signs/Symptoms:Murmur; Risk                  Factors:Diabetes and Hypertension.  Sonographer:     Sherrie Sport Referring Phys:  [id number] JAN A MANSY Diagnosing Phys: Yolonda Kida MD  Sonographer Comments: Suboptimal apical window. IMPRESSIONS  1. TDS.  2. Left ventricular ejection fraction, by estimation, is 50 to 55%. The left ventricle has normal function. The left ventricle has no regional wall motion abnormalities. The left ventricular internal cavity size was mildly dilated. Left ventricular diastolic parameters are consistent with Grade II diastolic dysfunction (pseudonormalization).  3. Right ventricular systolic function is normal. The right ventricular size is normal.  4. Left atrial size was mildly dilated.  5. Right atrial size was mildly dilated.  6. The mitral valve is normal in structure. Trivial mitral valve regurgitation.  7. The aortic valve is normal in structure. Aortic valve regurgitation is not visualized. FINDINGS  Left Ventricle: Left ventricular ejection fraction, by estimation, is 50 to 55%. The left ventricle has normal function. The left ventricle has no regional wall motion abnormalities. The left ventricular internal cavity size was mildly dilated. There is  no left ventricular hypertrophy. Left ventricular diastolic parameters are consistent with Grade II diastolic dysfunction (pseudonormalization). Right Ventricle: The right ventricular size is normal. No increase in right ventricular wall thickness. Right ventricular systolic function is normal. Left Atrium: Left atrial size was mildly dilated. Right Atrium: Right atrial size was mildly dilated. Pericardium: There is no evidence of  pericardial effusion. Mitral Valve: The mitral valve is normal in structure. Trivial mitral valve regurgitation. Tricuspid Valve: The tricuspid valve is normal in structure. Tricuspid valve regurgitation is mild. Aortic Valve: The aortic valve is normal in structure. Aortic valve regurgitation is not visualized. Aortic valve mean gradient measures 4.7 mmHg. Aortic valve peak gradient measures 9.3 mmHg. Aortic valve area, by VTI measures 1.79 cm. Pulmonic Valve: The pulmonic valve was normal in structure. Pulmonic valve regurgitation is not visualized. Aorta: The ascending aorta was not well visualized. IAS/Shunts: No atrial level shunt detected by color flow Doppler. Additional Comments: TDS.  LEFT VENTRICLE PLAX 2D LVIDd:         5.00 cm   Diastology LVIDs:         3.60 cm   LV e' medial:    5.77 cm/s LV PW:         1.10 cm   LV E/e' medial:  24.1 LV IVS:        1.10 cm   LV e' lateral:   6.96 cm/s LVOT diam:     2.00 cm   LV E/e' lateral: 20.0 LV SV:  56 LV SV Index:   24 LVOT Area:     3.14 cm  RIGHT VENTRICLE RV Basal diam:  4.20 cm RV S prime:     13.90 cm/s TAPSE (M-mode): 3.2 cm LEFT ATRIUM              Index        RIGHT ATRIUM           Index LA diam:        4.40 cm  1.92 cm/m   RA Area:     19.70 cm LA Vol (A2C):   117.0 ml 50.96 ml/m  RA Volume:   57.10 ml  24.87 ml/m LA Vol (A4C):   68.9 ml  30.01 ml/m LA Biplane Vol: 97.9 ml  42.64 ml/m  AORTIC VALVE AV Area (Vmax):    1.58 cm AV Area (Vmean):   1.73 cm AV Area (VTI):     1.79 cm AV Vmax:           152.67 cm/s AV Vmean:          98.300 cm/s AV VTI:            0.311 m AV Peak Grad:      9.3 mmHg AV Mean Grad:      4.7 mmHg LVOT Vmax:         77.00 cm/s LVOT Vmean:        54.200 cm/s LVOT VTI:          0.177 m LVOT/AV VTI ratio: 0.57  AORTA Ao Root diam: 2.47 cm MITRAL VALVE                TRICUSPID VALVE MV Area (PHT): 2.63 cm     TR Peak grad:   35.3 mmHg MV Decel Time: 288 msec     TR Vmax:        297.00 cm/s MV E velocity: 139.00  cm/s MV A velocity: 92.50 cm/s   SHUNTS MV E/A ratio:  1.50         Systemic VTI:  0.18 m                             Systemic Diam: 2.00 cm Yolonda Kida MD Electronically signed by Yolonda Kida MD Signature Date/Time: 03/23/2022/8:00:35 AM    Final      Medications:    sodium chloride 10 mL/hr at 03/21/22 0651   anticoagulant sodium citrate      ceFAZolin (ANCEF) IV      apixaban  10 mg Oral BID   Followed by   Derrill Memo ON 03/29/2022] apixaban  5 mg Oral BID   atorvastatin  80 mg Oral QPM   calcium acetate  2,001 mg Oral TID WC   Chlorhexidine Gluconate Cloth  6 each Topical Q0600   vitamin D3  1,000 Units Oral Daily   cinacalcet  30 mg Oral Q breakfast   docusate sodium  200 mg Oral BID   fluticasone  1 spray Each Nare Daily   gabapentin  100 mg Oral BID   insulin aspart  0-6 Units Subcutaneous TID WC   insulin aspart protamine- aspart  10 Units Subcutaneous BID WC   levETIRAcetam  1,000 mg Oral Daily   levETIRAcetam  250 mg Oral Once per day on Mon Wed Fri   losartan  100 mg Oral QHS   metoprolol succinate  100 mg Oral Q breakfast   pantoprazole  40 mg Oral Daily   polyethylene glycol  17 g Oral Daily   torsemide  100 mg Oral Q breakfast   sodium chloride, acetaminophen **OR** acetaminophen, alteplase, anticoagulant sodium citrate, diphenoxylate-atropine, glucose, heparin, ipratropium-albuterol, lidocaine (PF), lidocaine-prilocaine, lidocaine-prilocaine, magnesium hydroxide, metoCLOPramide (REGLAN) injection, oxyCODONE, pentafluoroprop-tetrafluoroeth, traZODone  Assessment/ Plan:  Cynthia Dean is a 71 y.o.  female ast medical history including COPD, diabetes, carotid stenosis, hypertension, and end-stage renal disease on hemodialysis.  Patient presents to the emergency department with complaints of weakness, fatigue and fever.  Patient has been admitted for ESRD on hemodialysis ([place]) [N18.6, Z99.2] Acute pulmonary embolism without acute cor pulmonale ([place])  [I26.99] Sepsis with acute hypoxic respiratory failure and septic shock, due to unspecified organism ([place]) [A41.9, R65.21, J96.01]  [facility]/MWF/left aVF  End-stage renal disease on hemodialysis.  Will maintain outpatient schedule if possible.  Patient scheduled to receive dialysis later today.  Next treatment scheduled for Thursday.  2. Anemia of chronic kidney disease Lab Results  Component Value Date   HGB 7.5 (L) 03/23/2022    Patient receives [place] outpatient.  Hemoglobin remains below target.  Will order EPO with dialysis treatment.  3. Secondary Hyperparathyroidism: with outpatient labs: PTH 613, phosphorus 4.8, calcium 9.0 on 03/07/2022.   Lab Results  Component Value Date   PTH 131 (H) 03/14/2018   CALCIUM 9.5 03/23/2022   PHOS 3.0 10/25/2019  Cholecalciferol ordered outpatient. Continue calcium acetate with meals.  4. Diabetes mellitus type II with chronic kidney disease: insulin dependent. Home regimen includes Novolin. Most recent hemoglobin A1c is 7.5 on 01/29/20.   5.  Hypertension with chronic kidney disease.  Home regimen includes losartan, metoprolol, and torsemide.  Patient currently receiving these medications.  Blood pressure stable for this patient.   LOS: 3   8/30/20236:34 PM

## 2022-03-23 NOTE — Progress Notes (Signed)
Occupational Therapy Treatment Patient Details Name: Cynthia Dean MRN: [id number] DOB: 1951-02-15 Today's Date: 03/23/2022   History of present illness Mackenzie Kelsy Dean is a 71 y.o. African-American female with medical history significant for COPD, carotid stenosis, type 2 diabetes mellitus, end-stage renal disease on hemodialysis on MWF, hypertension and dyslipidemia, as well as invasive ductal carcinoma of the left breast status post surgical excision at [facility] recently, who presented to the emergency room with acute onset of generalized weakness and fatigue for the last couple of days.   OT comments  Ms Console was seen for OT treatment on this date. Upon arrival to room pt seated in chair, agreeable to tx reports need for BM. Pt noted to have soaked gown - unsure if spilled prune juice. MAX A + RW for SPT bed>BSC with poor safety awareness. MIN A don/doff gown in sitting and NT in to assist with care. NT noted new gown soiled and pt found to be weeping blood from L flank/armpit - RN in to assess and bandage. MAX A x2 pericare in standing. MOD A x2 + RW ~3 steps BSC>bed. Pt making good progress toward goals, will continue to follow POC. Discharge recommendation remains appropriate.     Recommendations for follow up therapy are one component of a multi-disciplinary discharge planning process, led by the attending physician.  Recommendations may be updated based on patient status, additional functional criteria and insurance authorization.    Follow Up Recommendations  Skilled nursing-short term rehab (<3 hours/day)    Assistance Recommended at Discharge Frequent or constant Supervision/Assistance  Patient can return home with the following  Two people to help with walking and/or transfers;Two people to help with bathing/dressing/bathroom   Equipment Recommendations  Other (comment) (defer)    Recommendations for Other Services      Precautions / Restrictions  Precautions Precautions: Fall Restrictions Weight Bearing Restrictions: No       Mobility Bed Mobility Overal bed mobility: Needs Assistance Bed Mobility: Sit to Supine       Sit to supine: Mod assist, +2 for physical assistance        Transfers Overall transfer level: Needs assistance Equipment used: Rolling walker (2 wheels) Transfers: Sit to/from Stand Sit to Stand: Mod assist                 Balance Overall balance assessment: Needs assistance Sitting-balance support: Feet supported, No upper extremity supported Sitting balance-Leahy Scale: Fair     Standing balance support: Bilateral upper extremity supported, During functional activity Standing balance-Leahy Scale: Poor                             ADL either performed or assessed with clinical judgement   ADL Overall ADL's : Needs assistance/impaired                                       General ADL Comments: MAX A x2 pericare in standing. MIN A don/doff gown in sitting x2 trials. MOD A x2 + RW for BSC t/f      Cognition Arousal/Alertness: Awake/alert Behavior During Therapy: WFL for tasks assessed/performed Overall Cognitive Status: Impaired/Different from baseline Area of Impairment: Following commands, Safety/judgement, Awareness, Problem solving                       Following Commands: Follows one  step commands with increased time Safety/Judgement: Decreased awareness of safety, Decreased awareness of deficits Awareness: Emergent Problem Solving: Slow processing, Decreased initiation, Difficulty sequencing, Requires verbal cues, Requires tactile cues General Comments: emotionally labile         Pertinent Vitals/ Pain       Pain Assessment Pain Assessment: No/denies pain   Frequency  Min 2X/week        Progress Toward Goals  OT Goals(current goals can now be found in the care plan section)  Progress towards OT goals: Progressing toward  goals  Acute Rehab OT Goals Patient Stated Goal: to go home OT Goal Formulation: With patient Time For Goal Achievement: 04/04/22 Potential to Achieve Goals: Good ADL Goals Pt Will Perform Grooming: with supervision;standing Pt Will Perform Lower Body Dressing: sit to/from stand;with min guard assist Pt Will Transfer to Toilet: with modified independence;ambulating;bedside commode  Plan Discharge plan remains appropriate;Frequency remains appropriate    Co-evaluation                 AM-PAC OT "6 Clicks" Daily Activity     Outcome Measure   Help from another person eating meals?: None Help from another person taking care of personal grooming?: A Lot Help from another person toileting, which includes using toliet, bedpan, or urinal?: A Lot Help from another person bathing (including washing, rinsing, drying)?: A Lot Help from another person to put on and taking off regular upper body clothing?: A Little Help from another person to put on and taking off regular lower body clothing?: A Lot 6 Click Score: 15    End of Session Equipment Utilized During Treatment: Rolling walker (2 wheels)  OT Visit Diagnosis: Other abnormalities of gait and mobility (R26.89);Muscle weakness (generalized) (M62.81)   Activity Tolerance Patient tolerated treatment well   Patient Left in bed;with call bell/phone within reach;with nursing/sitter in room   Nurse Communication Mobility status        Time: 8366-2947 OT Time Calculation (min): 40 min  Charges: OT General Charges $OT Visit: 1 Visit OT Treatments $Self Care/Home Management : 38-52 mins  Dessie Coma, M.S. OTR/L  03/23/22, 2:59 PM  ascom [phone]

## 2022-03-23 NOTE — Progress Notes (Signed)
       CROSS COVER NOTE  NAME: Cynthia Dean MRN: [id number] DOB : June 29, 1951    Notified by nursing that patient had a witnessed fall without injury. Patient was seated on the side of the bed and she began to slide off. RN reports she was present in the room and able to assist patient to ground. No loss of consciousness or change in sensorium that precipitated the fall.  Patient is not complaining of any pain and there is no visible sign of injury according to nursing.  No further investigative work-up necessary at this time we will continue to monitor and consider additional testing as indicated.  This document was prepared using Dragon voice recognition software and may include unintentional dictation errors.  Neomia Glass DNP, MHA, FNP-BC Nurse Practitioner [facility] Pager [phone]

## 2022-03-23 NOTE — Consult Note (Signed)
NAME: Cynthia Dean  DOB: 02-Jun-1951  MRN: [id number]  Date/Time: 03/23/2022 12:29 PM  REQUESTING PROVIDER: Dr.Amery Subjective:  REASON FOR CONSULT: bacteremia ? Cynthia Dean is a 71 y.o. female with a history of  ESRD, CVA,  left breast ca  underwent Left [place]i [place] localized partial mastectomy, left sentinel lymph node biopsy and targeted axillary dissection  performed on 03/04/2022.at [facility] presented to the ED on 8/26 fatigue, weakness X 2 days. Pt was brought in by EMS. She had not been to dialysis the day before because of weakness and she felt she was filled with fluid- EMS temp was 102.6 Pt was confused in the ED  03/19/22  BP 129/42 !  Temp 102.9 F (39.4 C) !  Pulse Rate 65  Resp 20  SpO2 98 %  O2 Flow Rate (L/min) 4 L/min  Weight 254 lb  Height '5\' 9"'$  (1.753 m)  BMI (Calculated) 37.49    Latest Reference Range & Units 03/19/22  WBC 4.0 - 10.5 K/uL 16.5 (H)  Hemoglobin 12.0 - 15.0 g/dL 8.1 (L)  HCT 36.0 - 46.0 % 26.1 (L)  Platelets 150 - 400 K/uL 214  Creatinine 0.44 - 1.00 mg/dL 11.43 (H)    CTA chest showed a large mass like lesion left breast with gas Blood culture sent and she was started on vanco and cefepime I am asked to see her for staph capitis in blood culture  Past Medical History:  Diagnosis Date   Acute on chronic respiratory failure with hypoxia ([place]) 07/30/2019   Acute respiratory failure with hypoxia ([place]) 12/31/2018   Carotid arterial disease ([place])    a. 02/2018 < bilat ICA stenosis.   COPD (chronic obstructive pulmonary disease) ([facility])    Coronary artery disease    a. 11/2015 MV: EF 57%, no ischemia/infarct.   Cryptogenic stroke ([place])    a. 10/2017 s/p implantable loop recorder (no Afib to date).   Diabetes mellitus without complication ([place])    Diarrhea    ESRD on dialysis [facility])    GI bleed    a. 01/2019 EGD/Colonoscopy: Mild gastritis. Colitis.   Heart murmur    a. 12/2018 Echo: EF 55-60%, impaired relaxation. Mod dil LA. Mildly dil  RA. Mild Ca2+ of AoV.   Hematemesis 06/27/2017   History of 2019 novel coronavirus disease (COVID-19) 07/30/2019   Hyperkalemia    Hyperlipidemia    Hypertension    Intractable nausea and vomiting 08/08/2015   Nausea & vomiting 05/30/2018   Nausea and vomiting 05/29/2018   Pneumonia due to COVID-19 virus     Past Surgical History:  Procedure Laterality Date   A/V FISTULAGRAM Left 12/20/2016   Procedure: A/V Fistulagram;  Surgeon: Katha Cabal, MD;  Location: [place] [facility];  Service: Cardiovascular;  Laterality: Left;   A/V SHUNT INTERVENTION N/A 12/20/2016   Procedure: A/V Shunt Intervention;  Surgeon: Katha Cabal, MD;  Location: [place] [facility];  Service: Cardiovascular;  Laterality: N/A;   A/V SHUNTOGRAM Left 09/11/2017   Procedure: A/V SHUNTOGRAM;  Surgeon: Algernon Huxley, MD;  Location: [place] [facility];  Service: Cardiovascular;  Laterality: Left;   A/V SHUNTOGRAM Left 11/21/2019   Procedure: A/V SHUNTOGRAM;  Surgeon: Algernon Huxley, MD;  Location: [place] [facility];  Service: Cardiovascular;  Laterality: Left;   BREAST BIOPSY Bilateral 07/19/2000   neg   BREAST BIOPSY Left 11/03/2021   [place] bx mass at 3:00, venus marker, axilla bx-hyrdo marker, path pending   COLONOSCOPY WITH  PROPOFOL N/A 02/16/2019   Procedure: COLONOSCOPY WITH PROPOFOL;  Surgeon: Toledo, Benay Pike, MD;  Location: [facility];  Service: Gastroenterology;  Laterality: N/A;   ESOPHAGOGASTRODUODENOSCOPY (EGD) WITH PROPOFOL N/A 02/16/2019   Procedure: ESOPHAGOGASTRODUODENOSCOPY (EGD) WITH PROPOFOL;  Surgeon: Toledo, Benay Pike, MD;  Location: [facility];  Service: Gastroenterology;  Laterality: N/A;   LOOP RECORDER INSERTION N/A 11/16/2017   Procedure: LOOP RECORDER INSERTION;  Surgeon: Deboraha Sprang, MD;  Location: [place] [facility];  Service: Cardiovascular;  Laterality: N/A;   PERIPHERAL VASCULAR CATHETERIZATION Left 02/02/2015   Procedure: A/V Shuntogram/Fistulagram;  Surgeon:  Algernon Huxley, MD;  Location: [place] [facility];  Service: Cardiovascular;  Laterality: Left;   PERIPHERAL VASCULAR CATHETERIZATION Left 02/02/2015   Procedure: A/V Shunt Intervention;  Surgeon: Algernon Huxley, MD;  Location: [place] [facility];  Service: Cardiovascular;  Laterality: Left;   PERIPHERAL VASCULAR CATHETERIZATION Left 03/09/2015   Procedure: A/V Shuntogram/Fistulagram;  Surgeon: Algernon Huxley, MD;  Location: [place] [facility];  Service: Cardiovascular;  Laterality: Left;   PERIPHERAL VASCULAR CATHETERIZATION N/A 03/09/2015   Procedure: A/V Shunt Intervention;  Surgeon: Algernon Huxley, MD;  Location: [place] [facility];  Service: Cardiovascular;  Laterality: N/A;   PERIPHERAL VASCULAR CATHETERIZATION Left 04/17/2015   Procedure: Upper Extremity Angiography;  Surgeon: Algernon Huxley, MD;  Location: [place] [facility];  Service: Cardiovascular;  Laterality: Left;   PERIPHERAL VASCULAR CATHETERIZATION  04/17/2015   Procedure: Upper Extremity Intervention;  Surgeon: Algernon Huxley, MD;  Location: [place] [facility];  Service: Cardiovascular;;   PERIPHERAL VASCULAR CATHETERIZATION N/A 08/10/2015   Procedure: A/V Shuntogram/Fistulagram;  Surgeon: Algernon Huxley, MD;  Location: [place] [facility];  Service: Cardiovascular;  Laterality: N/A;   PERIPHERAL VASCULAR CATHETERIZATION N/A 08/10/2015   Procedure: A/V Shunt Intervention;  Surgeon: Algernon Huxley, MD;  Location: [place] [facility];  Service: Cardiovascular;  Laterality: N/A;   PERIPHERAL VASCULAR CATHETERIZATION Left 04/11/2016   Procedure: A/V Shuntogram/Fistulagram;  Surgeon: Algernon Huxley, MD;  Location: [place] [facility];  Service: Cardiovascular;  Laterality: Left;   TEE WITHOUT CARDIOVERSION N/A 11/16/2017   Procedure: TRANSESOPHAGEAL ECHOCARDIOGRAM (TEE);  Surgeon: Minna Merritts, MD;  Location: [facility];  Service: Cardiovascular;  Laterality: N/A;    Social History   Socioeconomic History   Marital status: Single     Spouse name: Not on file   Number of children: 5   Years of education: Not on file   Highest education level: Not on file  Occupational History   Occupation: Disability   Tobacco Use   Smoking status: Former    Packs/day: 0.00    Types: Cigarettes   Smokeless tobacco: Never  Vaping Use   Vaping Use: Never used  Substance and Sexual Activity   Alcohol use: No   Drug use: No   Sexual activity: Not Currently  Other Topics Concern   Not on file  Social History Narrative   Live with Fiance   Social Determinants of Health   Financial Resource Strain: Low Risk  (11/12/2017)   Overall Financial Resource Strain (CARDIA)    Difficulty of Paying Living Expenses: Not hard at all  Food Insecurity: No Food Insecurity (11/12/2017)   Hunger Vital Sign    Worried About Running Out of Food in the Last Year: Never true    [place] in the Last Year: Never true  Transportation Needs: No Transportation Needs (11/12/2017)   [place] - Transportation  Lack of Transportation (Medical): No    Lack of Transportation (Non-Medical): No  Physical Activity: Insufficiently Active (11/12/2017)   Exercise Vital Sign    Days of Exercise per Week: 5 days    Minutes of Exercise per Session: 20 min  Stress: No Stress Concern Present (11/12/2017)   [place]    Feeling of Stress : Not at all  Social Connections: Moderately Integrated (11/12/2017)   Social Connection and Isolation Panel [NHANES]    Frequency of Communication with Friends and Family: Twice a week    Frequency of Social Gatherings with Friends and Family: Twice a week    Attends Religious Services: More than 4 times per year    Active Member of [employer] or Organizations: Yes    Attends Archivist Meetings: More than 4 times per year    Marital Status: Never married  Intimate Partner Violence: Not At Risk (11/13/2017)   Humiliation, Afraid, Rape, and Kick  questionnaire    Fear of Current or Ex-Partner: No    Emotionally Abused: No    Physically Abused: No    Sexually Abused: No    Family History  Problem Relation Age of Onset   Breast cancer Father 33   Stroke Mother    Heart attack Mother    Heart Problems Sister    Breast cancer Cousin 100       1 st cousin. Maternal    Allergies  Allergen Reactions   Oxycodone Nausea And Vomiting   Oxycodone-Acetaminophen Other (See Comments)   Wound Dressing Adhesive    Hydrocodone     Intolerant more than allergic   Tape Itching    Skin Dermatitis/itching (tape adhesive) Skin Dermatitis/itching (tape adhesive)   Tapentadol Itching    Skin Dermatitis/itching (tape adhesive) Skin Dermatitis/itching (tape adhesive)    I? Current Facility-Administered Medications  Medication Dose Route Frequency Provider Last Rate Last Admin   0.9 %  sodium chloride infusion   Intravenous PRN Wyvonnia Dusky, MD 10 mL/hr at 03/21/22 0651 Infusion Verify at 03/21/22 0651   acetaminophen (TYLENOL) tablet 650 mg  650 mg Oral Q6H PRN Mansy, Jan A, MD   650 mg at 03/23/22 0501   Or   acetaminophen (TYLENOL) suppository 650 mg  650 mg Rectal Q6H PRN Mansy, Jan A, MD       alteplase (CATHFLO ACTIVASE) injection 2 mg  2 mg Intracatheter Once PRN Colon Flattery, NP       anticoagulant sodium citrate solution 5 mL  5 mL Intracatheter PRN Colon Flattery, NP       apixaban (ELIQUIS) tablet 10 mg  10 mg Oral BID Darrick Penna, RPH   10 mg at 03/23/22 3086   Followed by   Derrill Memo ON 03/29/2022] apixaban (ELIQUIS) tablet 5 mg  5 mg Oral BID Darrick Penna, RPH       atorvastatin (LIPITOR) tablet 80 mg  80 mg Oral QPM Mansy, Jan A, MD   80 mg at 03/22/22 1729   calcium acetate (PHOSLO) capsule 2,001 mg  2,001 mg Oral TID WC Mansy, Jan A, MD   2,001 mg at 03/23/22 0939   cefTRIAXone (ROCEPHIN) 2 g in sodium chloride 0.9 % 100 mL IVPB  2 g Intravenous Q24H Mansy, Jan A, MD 200 mL/hr at 03/23/22 1040 2 g at  03/23/22 1040   Chlorhexidine Gluconate Cloth 2 % PADS 6 each  6 each Topical Q0600 Colon Flattery, NP  6 each at 03/23/22 8341   cholecalciferol (VITAMIN D3) 25 MCG (1000 UNIT) tablet 1,000 Units  1,000 Units Oral Daily Mansy, Jan A, MD   1,000 Units at 03/23/22 0943   cinacalcet (SENSIPAR) tablet 30 mg  30 mg Oral Q breakfast Mansy, Jan A, MD   30 mg at 03/23/22 9622   diphenoxylate-atropine (LOMOTIL) 2.5-0.025 MG per tablet 1 tablet  1 tablet Oral QID PRN Mansy, Jan A, MD       docusate sodium (COLACE) capsule 200 mg  200 mg Oral BID Wyvonnia Dusky, MD   200 mg at 03/23/22 2979   doxycycline (VIBRAMYCIN) 100 mg in sodium chloride 0.9 % 250 mL IVPB  100 mg Intravenous Q12H Wyvonnia Dusky, MD 125 mL/hr at 03/23/22 0420 100 mg at 03/23/22 0420   fluticasone (FLONASE) 50 MCG/ACT nasal spray 1 spray  1 spray Each Nare Daily Mansy, Jan A, MD   1 spray at 03/22/22 1217   gabapentin (NEURONTIN) capsule 100 mg  100 mg Oral BID Mansy, Jan A, MD   100 mg at 03/23/22 0942   glucose chewable tablet 4 g  1 tablet Oral PRN Mansy, Jan A, MD       heparin injection 1,000 Units  1,000 Units Intracatheter PRN Colon Flattery, NP       insulin aspart (novoLOG) injection 0-6 Units  0-6 Units Subcutaneous TID WC Wyvonnia Dusky, MD   1 Units at 03/21/22 1650   insulin aspart protamine- aspart (NOVOLOG MIX 70/30) injection 10 Units  10 Units Subcutaneous BID WC Wyvonnia Dusky, MD   10 Units at 03/23/22 0945   ipratropium-albuterol (DUONEB) 0.5-2.5 (3) MG/3ML nebulizer solution 3 mL  3 mL Nebulization BID Renda Rolls, RPH   3 mL at 03/23/22 0818   levETIRAcetam (KEPPRA) tablet 1,000 mg  1,000 mg Oral Daily Mansy, Jan A, MD   1,000 mg at 03/23/22 0940   levETIRAcetam (KEPPRA) tablet 250 mg  250 mg Oral Once per day on Mon Wed Fri Mansy, Jan A, MD   250 mg at 03/21/22 1649   lidocaine (PF) (XYLOCAINE) 1 % injection 5 mL  5 mL Intradermal PRN Colon Flattery, NP       lidocaine-prilocaine  (EMLA) cream 1 Application  1 Application Topical PRN Mansy, Jan A, MD       lidocaine-prilocaine (EMLA) cream 1 Application  1 Application Topical PRN Colon Flattery, NP       losartan (COZAAR) tablet 100 mg  100 mg Oral QHS Mansy, Jan A, MD   100 mg at 03/22/22 2152   magnesium hydroxide (MILK OF MAGNESIA) suspension 30 mL  30 mL Oral Daily PRN Mansy, Jan A, MD   30 mL at 03/23/22 0419   metoCLOPramide (REGLAN) injection 5 mg  5 mg Intravenous Q8H PRN Mansy, Jan A, MD       metoprolol succinate (TOPROL-XL) 24 hr tablet 100 mg  100 mg Oral Q breakfast Mansy, Jan A, MD   100 mg at 03/22/22 1027   oxyCODONE (Oxy IR/ROXICODONE) immediate release tablet 5 mg  5 mg Oral Q6H PRN Mansy, Jan A, MD       pantoprazole (PROTONIX) EC tablet 40 mg  40 mg Oral Daily Mansy, Jan A, MD   40 mg at 03/23/22 8921   pentafluoroprop-tetrafluoroeth (GEBAUERS) aerosol 1 Application  1 Application Topical PRN Colon Flattery, NP       polyethylene glycol (MIRALAX / GLYCOLAX) packet 17 g  17 g Oral  Daily Wyvonnia Dusky, MD   17 g at 03/23/22 0935   torsemide [facility]) tablet 100 mg  100 mg Oral Q breakfast Mansy, Jan A, MD   100 mg at 03/23/22 0941   traZODone (DESYREL) tablet 25 mg  25 mg Oral QHS PRN Mansy, Jan A, MD   25 mg at 03/21/22 2334     Abtx:  Anti-infectives (From admission, onward)    Start     Dose/Rate Route Frequency Ordered Stop   03/20/22 1615  doxycycline (VIBRAMYCIN) 100 mg in sodium chloride 0.9 % 250 mL IVPB        100 mg 125 mL/hr over 120 Minutes Intravenous Every 12 hours 03/20/22 1604     03/20/22 1000  cefTRIAXone (ROCEPHIN) 2 g in sodium chloride 0.9 % 100 mL IVPB        2 g 200 mL/hr over 30 Minutes Intravenous Every 24 hours 03/20/22 0148 03/27/22 0959   03/20/22 0200  cefTRIAXone (ROCEPHIN) 2 g in sodium chloride 0.9 % 100 mL IVPB  Status:  Discontinued        2 g 200 mL/hr over 30 Minutes Intravenous Every 24 hours 03/20/22 0147 03/20/22 0148   03/19/22 2200  vancomycin  (VANCOCIN) IVPB 1000 mg/200 mL premix        1,000 mg 200 mL/hr over 60 Minutes Intravenous  Once 03/19/22 2159 03/19/22 2329   03/19/22 2200  ceFEPIme (MAXIPIME) 2 g in sodium chloride 0.9 % 100 mL IVPB        2 g 200 mL/hr over 30 Minutes Intravenous  Once 03/19/22 2159 03/19/22 2322       REVIEW OF SYSTEMS:  Pt is confused and unable to get a good ros Says she hassome pain left breast Says she is constipated Objective:  VITALS:  BP (!) 163/67 (BP Location: Left Arm)   Pulse 64   Temp 98.2 F (36.8 C) (Oral)   Resp 20   Ht '5\' 9"'$  (1.753 m)   Wt 116.3 kg   SpO2 100%   BMI 37.86 kg/m   PHYSICAL EXAM:  General: was in dialysis and woke her up- she is oriented in person, place- she tried to answer questions but was confused Head: Normocephalic, without obvious abnormality, atraumatic. Eyes: Conjunctivae clear, anicteric sclerae. Pupils are equal ENT Nares normal. No drainage or sinus tenderness. Lips, mucosa, and tongue normal. No Thrush Neck:  symmetrical, no adenopathy, thyroid: non tender no carotid bruit and no JVD. Back: did not examined Left AVF Lungs: b/l air entry. Heart: s1s2 Abdomen: Soft, non-tender,not distended. Bowel sounds normal. No masses Left breast- scar from previos lumpectomy outer quadrant Breast is indurated and edematous Left axilla LN dissection site appeared wet especially on the dressing  Extremities: rt leg edematous compared to left Skin: No rashes or lesions. Or bruising Lymph: Cervical, supraclavicular normal. Neurologic: moves all extremities Pertinent Labs Lab Results CBC    Component Value Date/Time   WBC 10.4 03/23/2022 0647   RBC 2.77 (L) 03/23/2022 0647   HGB 7.5 (L) 03/23/2022 0647   HGB 9.5 (L) 06/17/2014 0538   HCT 24.3 (L) 03/23/2022 0647   HCT 29.6 (L) 06/17/2014 0538   PLT 235 03/23/2022 0647   PLT 173 06/17/2014 0538   MCV 87.7 03/23/2022 0647   MCV 92 06/17/2014 0538   MCH 27.1 03/23/2022 0647   MCHC 30.9  03/23/2022 0647   RDW 16.5 (H) 03/23/2022 0647   RDW 16.2 (H) 06/17/2014 0538   LYMPHSABS 1.8 03/19/2022  2127   LYMPHSABS 2.5 06/17/2014 0538   MONOABS 1.5 (H) 03/19/2022 2127   MONOABS 1.0 (H) 06/17/2014 0538   EOSABS 0.0 03/19/2022 2127   EOSABS 0.2 06/17/2014 0538   BASOSABS 0.1 03/19/2022 2127   BASOSABS 0.1 06/17/2014 0538       Latest Ref Rng & Units 03/23/2022    6:47 AM 03/22/2022    1:56 AM 03/21/2022    6:55 AM  CMP  Glucose 70 - 99 mg/dL 101  91  84   BUN 8 - 23 mg/dL 38  32  60   Creatinine 0.44 - 1.00 mg/dL 9.31  7.57  12.47   Sodium 135 - 145 mmol/L 137  136  135   Potassium 3.5 - 5.1 mmol/L 4.4  3.6  4.6   Chloride 98 - 111 mmol/L 101  98  101   CO2 22 - 32 mmol/L '26  26  22   '$ Calcium 8.9 - 10.3 mg/dL 9.5  8.7  8.7       Microbiology: Recent Results (from the past 240 hour(s))  Resp Panel by RT-PCR (Flu A&B, Covid) Anterior Nasal Swab     Status: None   Collection Time: 03/19/22  9:28 PM   Specimen: Anterior Nasal Swab  Result Value Ref Range Status   SARS Coronavirus 2 by RT PCR NEGATIVE NEGATIVE Final    Comment: (NOTE) SARS-CoV-2 target nucleic acids are NOT DETECTED.  The SARS-CoV-2 RNA is generally detectable in upper respiratory specimens during the acute phase of infection. The lowest concentration of SARS-CoV-2 viral copies this assay can detect is 138 copies/mL. A negative result does not preclude SARS-Cov-2 infection and should not be used as the sole basis for treatment or other patient management decisions. A negative result may occur with  improper specimen collection/handling, submission of specimen other than nasopharyngeal swab, presence of viral mutation(s) within the areas targeted by this assay, and inadequate number of viral copies(<138 copies/mL). A negative result must be combined with clinical observations, patient history, and epidemiological information. The expected result is Negative.  Fact Sheet for Patients:   EntrepreneurPulse.com.au  Fact Sheet for Healthcare Providers:  IncredibleEmployment.be  This test is no t yet approved or cleared by the [place] FDA and  has been authorized for detection and/or diagnosis of SARS-CoV-2 by FDA under an Emergency Use Authorization (EUA). This EUA will remain  in effect (meaning this test can be used) for the duration of the COVID-19 declaration under Section 564(b)(1) of the Act, 21 U.S.C.section 360bbb-3(b)(1), unless the authorization is terminated  or revoked sooner.       Influenza A by PCR NEGATIVE NEGATIVE Final   Influenza B by PCR NEGATIVE NEGATIVE Final    Comment: (NOTE) The Xpert Xpress SARS-CoV-2/FLU/RSV plus assay is intended as an aid in the diagnosis of influenza from Nasopharyngeal swab specimens and should not be used as a sole basis for treatment. Nasal washings and aspirates are unacceptable for Xpert Xpress SARS-CoV-2/FLU/RSV testing.  Fact Sheet for Patients: EntrepreneurPulse.com.au  Fact Sheet for Healthcare Providers: IncredibleEmployment.be  This test is not yet approved or cleared by the [place] FDA and has been authorized for detection and/or diagnosis of SARS-CoV-2 by FDA under an Emergency Use Authorization (EUA). This EUA will remain in effect (meaning this test can be used) for the duration of the COVID-19 declaration under Section 564(b)(1) of the Act, 21 U.S.C. section 360bbb-3(b)(1), unless the authorization is terminated or revoked.  Performed at [facility], [address] [place], [place] [address]   Blood Culture (routine x 2)     Status: Abnormal   Collection Time: 03/19/22  9:28 PM   Specimen: BLOOD  Result Value Ref Range Status   Specimen Description   Final    BLOOD RIGHT ANTECUBITAL Performed at [facility], [address] [place], [place] [address]    Special Requests   Final     BOTTLES DRAWN AEROBIC AND ANAEROBIC Blood Culture adequate volume Performed at [facility], [place], [place] [address]    Culture  Setup Time   Final    GRAM POSITIVE COCCI IN BOTH AEROBIC AND ANAEROBIC BOTTLES CRITICAL RESULT CALLED TO, READ BACK BY AND VERIFIED WITH: CAROLYN CHILDS 03/20/22 1457 AMK Performed at [facility], [address] [place], [place] [address]    Culture STAPHYLOCOCCUS CAPITIS (A)  Final   Report Status 03/23/2022 FINAL  Final   Organism ID, Bacteria STAPHYLOCOCCUS CAPITIS  Final      Susceptibility   Staphylococcus capitis - MIC*    CIPROFLOXACIN <=0.5 SENSITIVE Sensitive     ERYTHROMYCIN <=0.25 SENSITIVE Sensitive     GENTAMICIN <=0.5 SENSITIVE Sensitive     OXACILLIN <=0.25 SENSITIVE Sensitive     TETRACYCLINE <=1 SENSITIVE Sensitive     VANCOMYCIN <=0.5 SENSITIVE Sensitive     TRIMETH/SULFA <=10 SENSITIVE Sensitive     CLINDAMYCIN <=0.25 SENSITIVE Sensitive     RIFAMPIN <=0.5 SENSITIVE Sensitive     Inducible Clindamycin NEGATIVE Sensitive     * STAPHYLOCOCCUS CAPITIS  Blood Culture ID Panel (Reflexed)     Status: Abnormal   Collection Time: 03/19/22  9:28 PM  Result Value Ref Range Status   Enterococcus faecalis NOT DETECTED NOT DETECTED Final   Enterococcus Faecium NOT DETECTED NOT DETECTED Final   Listeria monocytogenes NOT DETECTED NOT DETECTED Final   Staphylococcus species DETECTED (A) NOT DETECTED Final    Comment: CRITICAL RESULT CALLED TO, READ BACK BY AND VERIFIED WITH: CAROLYN CHILDS 03/20/22 1457 AMK    Staphylococcus aureus (BCID) NOT DETECTED NOT DETECTED Final   Staphylococcus epidermidis NOT DETECTED NOT DETECTED Final   Staphylococcus lugdunensis NOT DETECTED NOT DETECTED Final   Streptococcus species NOT DETECTED NOT DETECTED Final   Streptococcus agalactiae NOT DETECTED NOT DETECTED Final   Streptococcus pneumoniae NOT DETECTED NOT DETECTED Final   Streptococcus pyogenes NOT DETECTED NOT  DETECTED Final   A.calcoaceticus-baumannii NOT DETECTED NOT DETECTED Final   Bacteroides fragilis NOT DETECTED NOT DETECTED Final   Enterobacterales NOT DETECTED NOT DETECTED Final   Enterobacter cloacae complex NOT DETECTED NOT DETECTED Final   Escherichia coli NOT DETECTED NOT DETECTED Final   Klebsiella aerogenes NOT DETECTED NOT DETECTED Final   Klebsiella oxytoca NOT DETECTED NOT DETECTED Final   Klebsiella pneumoniae NOT DETECTED NOT DETECTED Final   Proteus species NOT DETECTED NOT DETECTED Final   Salmonella species NOT DETECTED NOT DETECTED Final   Serratia marcescens NOT DETECTED NOT DETECTED Final   Haemophilus influenzae NOT DETECTED NOT DETECTED Final   Neisseria meningitidis NOT DETECTED NOT DETECTED Final   Pseudomonas aeruginosa NOT DETECTED NOT DETECTED Final   Stenotrophomonas maltophilia NOT DETECTED NOT DETECTED Final   Candida albicans NOT DETECTED NOT DETECTED Final   Candida auris NOT DETECTED NOT DETECTED Final   Candida glabrata NOT DETECTED NOT DETECTED Final   Candida krusei NOT DETECTED NOT DETECTED Final   Candida parapsilosis NOT DETECTED NOT DETECTED Final   Candida tropicalis NOT DETECTED NOT  DETECTED Final   Cryptococcus neoformans/gattii NOT DETECTED NOT DETECTED Final    Comment: Performed at [facility], [address] [place], [place] [address]  Blood Culture (routine x 2)     Status: Abnormal   Collection Time: 03/19/22 10:23 PM   Specimen: BLOOD  Result Value Ref Range Status   Specimen Description   Final    BLOOD BLOOD RIGHT FOREARM Performed at [facility], [address], [place], [place] [address]    Special Requests   Final    BOTTLES DRAWN AEROBIC AND ANAEROBIC Blood Culture adequate volume Performed at [facility], [address], [place], [place] [address]    Culture  Setup Time   Final    GRAM POSITIVE COCCI IN BOTH AEROBIC AND ANAEROBIC BOTTLES CRITICAL RESULT CALLED TO, READ BACK BY AND  VERIFIED WITH: ELVERA MADUEME 03/20/22 1545 AMK Performed at [facility], [address] [place], [place] [address]    Culture (A)  Final    STAPHYLOCOCCUS CAPITIS SUSCEPTIBILITIES PERFORMED ON PREVIOUS CULTURE WITHIN THE LAST 5 DAYS. Performed at [facility], [address], [place], [place] [address]    Report Status 03/22/2022 FINAL  Final  Urine Culture     Status: Abnormal   Collection Time: 03/20/22  1:25 AM   Specimen: In/Out Cath Urine  Result Value Ref Range Status   Specimen Description   Final    IN/OUT CATH URINE Performed at [facility], [address] [place], [place] [address]    Special Requests   Final    NONE Performed at [facility], [address] [place], [place] [address]    Culture 4,000 COLONIES/mL STAPHYLOCOCCUS EPIDERMIDIS (A)  Final   Report Status 03/22/2022 FINAL  Final   Organism ID, Bacteria STAPHYLOCOCCUS EPIDERMIDIS (A)  Final      Susceptibility   Staphylococcus epidermidis - MIC*    CIPROFLOXACIN <=0.5 SENSITIVE Sensitive     GENTAMICIN <=0.5 SENSITIVE Sensitive     NITROFURANTOIN <=16 SENSITIVE Sensitive     OXACILLIN <=0.25 SENSITIVE Sensitive     TETRACYCLINE <=1 SENSITIVE Sensitive     VANCOMYCIN 2 SENSITIVE Sensitive     TRIMETH/SULFA 80 RESISTANT Resistant     CLINDAMYCIN >=8 RESISTANT Resistant     RIFAMPIN <=0.5 SENSITIVE Sensitive     Inducible Clindamycin NEGATIVE Sensitive     * 4,000 COLONIES/mL STAPHYLOCOCCUS EPIDERMIDIS  Culture, blood (Routine X 2) w Reflex to ID Panel     Status: None (Preliminary result)   Collection Time: 03/21/22 11:00 AM   Specimen: BLOOD  Result Value Ref Range Status   Specimen Description BLOOD BLOOD RIGHT FOREARM  Final   Special Requests   Final    BOTTLES DRAWN AEROBIC AND ANAEROBIC Blood Culture adequate volume   Culture   Final    NO GROWTH 2 DAYS Performed at [facility], [address], [place],  [address]    Report Status  PENDING  Incomplete  Culture, blood (Routine X 2) w Reflex to ID Panel     Status: None (Preliminary result)   Collection Time: 03/22/22 11:53 AM   Specimen: BLOOD RIGHT HAND  Result Value Ref Range Status   Specimen Description BLOOD RIGHT HAND  Final   Special Requests   Final    BOTTLES DRAWN AEROBIC AND ANAEROBIC Blood Culture adequate volume   Culture   Final    NO GROWTH < 24 HOURS Performed at [facility], [address] [place], [place] [address]    Report Status PENDING  Incomplete    IMAGING RESULTS: Doppler b/l no DVT CTA - small PE within both lower lobes and the rt upper lobe  I have personally reviewed the films ? Impression/Recommendation 71 yr female with ESRD, left breast ca s/p partial mastectomy and left axillary dissection on 8/11 presenting fever and confusion and weakness ? Staph capitis bacteremia- likely related to the left breast infection  Left breast very enlarged and indurated- recent partial mastectomy- concern for abscess- will get US breast  Surgical consult Change ceftriaxone and doxy to Iv cefazolin  Encephalopathy related to above and possibly missing dialysis  B/l small PE- no hypoxia  ESRD on dialysis ? ___________________________________________________ Discussed with patient, and requesting provider and nurse Note:  This document was prepared using Dragon voice recognition software and may include unintentional dictation errors.

## 2022-03-23 NOTE — TOC Progression Note (Addendum)
Transition of Care [facility]) - Progression Note    Patient Details  Name: Cynthia Dean MRN: [id number] Date of Birth: 1951-01-23  Transition of Care [facility]) CM/SW Contact  Candie Chroman, LCSW Phone Number: 03/23/2022, 8:21 AM  Clinical Narrative:  No bed offers. [facility], [facility], and [facility] have not responded. Asked admissions coordinators to review referral.   10:56 am: Patient still confused per last RN assessment. Called daughter Joelene Millin and she accepted bed offer from [facility].  Expected Discharge Plan: [place] Barriers to Discharge: Continued Medical Work up  Expected Discharge Plan and Services Expected Discharge Plan: [place] Choice: [place] arrangements for the past 2 months: Single Family Home                                       Social Determinants of Health (SDOH) Interventions    Readmission Risk Interventions    10/25/2019    8:43 AM 10/24/2019   10:14 AM 08/07/2019    3:25 PM  Readmission Risk Prevention Plan  Transportation Screening Complete  Complete  PCP or Specialist Appt within 3-5 Days Not Complete    HRI or Home Care Consult Complete Complete   Palliative Care Screening Not Applicable    Medication Review (RN Care Manager) Complete  Complete  HRI or [place]   Complete  [place]   Not Applicable

## 2022-03-24 ENCOUNTER — Encounter: Payer: Self-pay | Admitting: Family Medicine

## 2022-03-24 ENCOUNTER — Inpatient Hospital Stay: Payer: Medicare Other

## 2022-03-24 DIAGNOSIS — N611 Abscess of the breast and nipple: Secondary | ICD-10-CM | POA: Diagnosis not present

## 2022-03-24 DIAGNOSIS — N186 End stage renal disease: Secondary | ICD-10-CM | POA: Diagnosis not present

## 2022-03-24 DIAGNOSIS — E785 Hyperlipidemia, unspecified: Secondary | ICD-10-CM | POA: Diagnosis not present

## 2022-03-24 DIAGNOSIS — I2699 Other pulmonary embolism without acute cor pulmonale: Secondary | ICD-10-CM | POA: Diagnosis not present

## 2022-03-24 DIAGNOSIS — Z992 Dependence on renal dialysis: Secondary | ICD-10-CM | POA: Diagnosis not present

## 2022-03-24 DIAGNOSIS — D638 Anemia in other chronic diseases classified elsewhere: Secondary | ICD-10-CM | POA: Diagnosis not present

## 2022-03-24 DIAGNOSIS — A419 Sepsis, unspecified organism: Secondary | ICD-10-CM | POA: Diagnosis not present

## 2022-03-24 LAB — GLUCOSE, CAPILLARY
Glucose-Capillary: 129 mg/dL — ABNORMAL HIGH (ref 70–99)
Glucose-Capillary: 132 mg/dL — ABNORMAL HIGH (ref 70–99)
Glucose-Capillary: 157 mg/dL — ABNORMAL HIGH (ref 70–99)
Glucose-Capillary: 146 mg/dL — ABNORMAL HIGH (ref 70–99)

## 2022-03-24 LAB — PROTIME-INR
INR: 1.7 — ABNORMAL HIGH (ref 0.8–1.2)
Prothrombin Time: 19.6 seconds — ABNORMAL HIGH (ref 11.4–15.2)

## 2022-03-24 MED ORDER — HEPARIN (PORCINE) 25000 UT/250ML-% IV SOLN
1500.0000 [IU]/h | INTRAVENOUS | Status: DC
  Administered 2022-03-24 – 2022-03-27 (×3): 1500 [IU]/h via INTRAVENOUS
  Filled 2022-03-24 (×4): qty 250

## 2022-03-24 MED ORDER — IOHEXOL 300 MG/ML  SOLN
75.0000 mL | Freq: Once | INTRAMUSCULAR | Status: AC | PRN
  Administered 2022-03-24: 75 mL via INTRAVENOUS

## 2022-03-24 NOTE — Progress Notes (Signed)
PROGRESS NOTE   HPI was taken from Dr. Sidney Ace: Cynthia Dean is a 71 y.o. African-American female with medical history significant for COPD, carotid stenosis, type 2 diabetes mellitus, end-stage renal disease on hemodialysis on MWF, hypertension and dyslipidemia, as well as invasive ductal carcinoma of the left breast status post surgical excision at [facility] recently, who presented to the emergency room with acute onset of generalized weakness and fatigue for the last couple of days.  She missed hemodialysis session yesterday.  She was fairly confused in the ER.  She has been having dyspnea with mild cough and occasional wheezing.  She admitted to fever and chills.  No chest pain or palpitations.  No nausea or vomiting or diarrhea or abdominal pain.  No bleeding diathesis.   ED Course: Upon presentation to the emergency room temperature was 102.9 with BP 139/57 heart rate 50 with respiratory to 23  with pulse oximetry of 87% on room air and 98% on 4 L of O2 by nasal cannula.  Labs revealed leukocytosis 16.5 with neutrophilia and hemoglobin of 8.1 with hematocrit 26.1 with platelets of 214.  Influenza antigens and COVID-19 PCR came back negative.  Blood cultures were drawn. EKG as reviewed by me : EKG showed sinus rhythm with rate of 81 with T wave inversion laterally. Imaging: Portable chest x-ray showed increasing vascular congestion without pulmonary edema.  Chest CTA revealed small pulmonary emboli within both lower lobes and the right upper lobe with no evidence for right heart strain.  It showed cardiomegaly and coronary artery disease with bibasal atelectasis and large masslike area in the left breast with surrounding soft tissue gas that corresponds with recent left lumpectomy.   The patient was given IV cefepime and heparin bolus and drip as well as 500 mL IV normal saline.  She will be admitted to a progressive unit bed for further evaluation and management.  As per Dr. Jimmye Norman  8/27-8/29/23: Pt presented w/ shortness of breath and was found to have acute pulmonary embolism w/o right heart strain. Pt was initially put on IV heparin drip and then changed to po eliquis. Echo is pending. Also, pt was admitted for mild breast cellulitis and has been on IV rocephin. Urine cx was growing staph epidermis and blood cxs growing stap captis for which pt has been on IV doxycycline. The blood cxs likely represent a containment. Repeat blood cxs NGTD. PT recs SNF.   8/31 LT breast US done.    Cynthia Dean  IDP:[id number] DOB: [birth year] DOA: 03/19/2022 PCP: Lowella Bandy, MD   Assessment & Plan:   Principal Problem:   Acute pulmonary embolism without acute cor pulmonale (HCC) Active Problems:   Sepsis due to cellulitis ([place])   ESRD (end stage renal disease) ([place])   Essential hypertension   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[place])   Dyslipidemia   Diabetic neuropathy (HCC)   Urinary tract infection   Anemia of chronic disease  Assessment and Plan: Acute pulmonary embolism: without acute cor pulmonale. D/c IV heparin and start eliquis.  8/30 aspirin Plavix on hold  Echo with EF 50 to 55%.  Grade 2 diastolic dysfunction.  RV normal function and size  Holding home dose of aspirin, plavix (was not on plavix per pharm but will ck again) 4/43 RV systolic function and size normal.  EF 50 to 55%.    ACD: likely secondary to ESRD.  W/ possible component of acute blood loss anemia. S/p 1 unit of  pRBCs transfused so far.  8/31 giving blood work break.  We will check H&H tomorrow        Sepsis: see Dr. Randel Books note on how pt met sepsis criteria. Likely secondary to left breast cellulitis. 8/31 on Rocephin  7 6 resolved      Mild cellulitis of left breast:  On Rocephin...changed to cefazolin by Id 8/31 Lt breast US with seroma v.s. abscess.  ID following Will consult surgery   Staph capitis bacteremia:  Repest ntd, but was on  abx Likely from breast  ID following    Possible UTI: urine cx staph epidermidis.  Was treated with iv doxy. Now on iv cefazolin for above    ESRD: on HD MWF. Missed HD on Friday. Nephro following and recs apprec  Constipation: continue on colace, miralax   Breast cancer: recent dx. S/p surgical excision at [facility] & chemo. High risk of blood clots w/ hx of cancer    Diabetic neuropathy: continue on home dose of gabapentin    HLD: continue on statin    DM2: likely poorly controlled. HbA1c ordered for AM. Continue on 70/30, SSI w/ accuchecks    HTN: continue on metoprolol, losartan           DVT prophylaxis: eliquis  Code Status:  full  Family Communication:  Disposition Plan:   Level of care: Progressive  Status is: Inpatient Remains inpatient appropriate because: severity of illness    Consultants:  Nephro   Procedures:  Antimicrobials: rocephin, doxycycline ..>cefazolin   Subjective: No complaints. No sob  Objective: Vitals:   03/23/22 2020 03/24/22 0007 03/24/22 0403 03/24/22 0803  BP: (!) 170/52 (!) 133/56 (!) 134/55 (!) 150/60  Pulse: 72 65 70 67  Resp: _0 Temp: 98.9 F (37.2 C) 98.5 F (36.9 C) 98.6 F (37 C) 98.3 F (36.8 C)  TempSrc: Oral Oral Oral Oral  SpO2:  95% 100% 100%  Weight:      Height:        Intake/Output Summary (Last 24 hours) at 03/24/2022 0910 Last data filed at 03/23/2022 1814 Gross per 24 hour  Intake --  Output 2000 ml  Net -2000 ml   Filed Weights   03/21/22 2239 03/22/22 0448 03/23/22 1814  Weight: 123 kg 116.3 kg 122.5 kg    Examination: Calm, NAD Cta no w/r Lt breast with minimal serosanqunous drainage Reg s1/s2 no gallop Soft benign +bs No edema Grossly intact Mood and affect appropriate in current setting     Data Reviewed: I have personally reviewed following labs and imaging studies  CBC: Recent Labs  Lab 03/19/22 2127 03/20/22 0448 03/20/22 1901 03/21/22 0655 03/21/22 1847  03/22/22 0156 03/23/22 0647  WBC 16.5* 14.4*  --  9.6  --  9.7 10.4  NEUTROABS 13.0*  --   --   --   --   --   --   HGB 8.1* 6.6* 6.9* 7.1* 7.5* 8.0* 7.5*  HCT 26.1* 21.8* 22.3* 22.7* 23.8* 25.3* 24.3*  MCV 90.6 93.2  --  87.3  --  86.6 87.7  PLT 214 163  --  168  --  210 828   Basic Metabolic Panel: Recent Labs  Lab 03/19/22 2127 03/20/22 0448 03/21/22 0655 03/22/22 0156 03/23/22 0647  NA 138 136 135 136 137  K 4.3 3.8 4.6 3.6 4.4  CL 100 109 101 98 101  CO2 24 21* _1 GLUCOSE 94 105* 84 91 101*  BUN 48*  47* 60* 32* 38*  CREATININE 11.43* 9.97* 12.47* 7.57* 9.31*  CALCIUM 9.6 7.9* 8.7* 8.7* 9.5   GFR: Estimated Creatinine Clearance: 7.8 mL/min (A) (by C-G formula based on SCr of 9.31 mg/dL (H)). Liver Function Tests: Recent Labs  Lab 03/19/22 2127  AST 15  ALT 7  ALKPHOS 89  BILITOT 1.2  PROT 7.2  ALBUMIN 3.4*   No results for input(s): "LIPASE", "AMYLASE" in the last 168 hours. No results for input(s): "AMMONIA" in the last 168 hours. Coagulation Profile: Recent Labs  Lab 03/19/22 2127  INR 1.2   Cardiac Enzymes: No results for input(s): "CKTOTAL", "CKMB", "CKMBINDEX", "TROPONINI" in the last 168 hours. BNP (last 3 results) No results for input(s): "PROBNP" in the last 8760 hours. HbA1C: Recent Labs    03/23/22 0647  HGBA1C 5.8*   CBG: Recent Labs  Lab 03/23/22 0743 03/23/22 1152 03/23/22 1853 03/23/22 2017 03/24/22 0805  GLUCAP 108* 113* 102* 141* 129*   Lipid Profile: No results for input(s): "CHOL", "HDL", "LDLCALC", "TRIG", "CHOLHDL", "LDLDIRECT" in the last 72 hours. Thyroid Function Tests: No results for input(s): "TSH", "T4TOTAL", "FREET4", "T3FREE", "THYROIDAB" in the last 72 hours. Anemia Panel: No results for input(s): "VITAMINB12", "FOLATE", "FERRITIN", "TIBC", "IRON", "RETICCTPCT" in the last 72 hours. Sepsis Labs: Recent Labs  Lab 03/19/22 2127 03/20/22 0026 03/20/22 0448  PROCALCITON  --   --  3.92  LATICACIDVEN  2.0* 1.1  --     Recent Results (from the past 240 hour(s))  Resp Panel by RT-PCR (Flu A&B, Covid) Anterior Nasal Swab     Status: None   Collection Time: 03/19/22  9:28 PM   Specimen: Anterior Nasal Swab  Result Value Ref Range Status   SARS Coronavirus 2 by RT PCR NEGATIVE NEGATIVE Final    Comment: (NOTE) SARS-CoV-2 target nucleic acids are NOT DETECTED.  The SARS-CoV-2 RNA is generally detectable in upper respiratory specimens during the acute phase of infection. The lowest concentration of SARS-CoV-2 viral copies this assay can detect is 138 copies/mL. A negative result does not preclude SARS-Cov-2 infection and should not be used as the sole basis for treatment or other patient management decisions. A negative result may occur with  improper specimen collection/handling, submission of specimen other than nasopharyngeal swab, presence of viral mutation(s) within the areas targeted by this assay, and inadequate number of viral copies(<138 copies/mL). A negative result must be combined with clinical observations, patient history, and epidemiological information. The expected result is Negative.  Fact Sheet for Patients:  EntrepreneurPulse.com.au  Fact Sheet for Healthcare Providers:  IncredibleEmployment.be  This test is no t yet approved or cleared by the [place] FDA and  has been authorized for detection and/or diagnosis of SARS-CoV-2 by FDA under an Emergency Use Authorization (EUA). This EUA will remain  in effect (meaning this test can be used) for the duration of the COVID-19 declaration under Section 564(b)(1) of the Act, 21 U.S.C.section 360bbb-3(b)(1), unless the authorization is terminated  or revoked sooner.       Influenza A by PCR NEGATIVE NEGATIVE Final   Influenza B by PCR NEGATIVE NEGATIVE Final    Comment: (NOTE) The Xpert Xpress SARS-CoV-2/FLU/RSV plus assay is intended as an aid in the diagnosis of influenza  from Nasopharyngeal swab specimens and should not be used as a sole basis for treatment. Nasal washings and aspirates are unacceptable for Xpert Xpress SARS-CoV-2/FLU/RSV testing.  Fact Sheet for Patients: EntrepreneurPulse.com.au  Fact Sheet for Healthcare Providers: IncredibleEmployment.be  This test is not  yet approved or cleared by the [place] and has been authorized for detection and/or diagnosis of SARS-CoV-2 by FDA under an Emergency Use Authorization (EUA). This EUA will remain in effect (meaning this test can be used) for the duration of the COVID-19 declaration under Section 564(b)(1) of the Act, 21 U.S.C. section 360bbb-3(b)(1), unless the authorization is terminated or revoked.  Performed at [facility], [address] [place], [place] [address]   Blood Culture (routine x 2)     Status: Abnormal   Collection Time: 03/19/22  9:28 PM   Specimen: BLOOD  Result Value Ref Range Status   Specimen Description   Final    BLOOD RIGHT ANTECUBITAL Performed at [facility], [address] [place], [place] [address]    Special Requests   Final    BOTTLES DRAWN AEROBIC AND ANAEROBIC Blood Culture adequate volume Performed at [facility], [place], [place] [address]    Culture  Setup Time   Final    GRAM POSITIVE COCCI IN BOTH AEROBIC AND ANAEROBIC BOTTLES CRITICAL RESULT CALLED TO, READ BACK BY AND VERIFIED WITH: CAROLYN CHILDS 03/20/22 1457 AMK Performed at [facility], [address] [place], [place] [address]    Culture STAPHYLOCOCCUS CAPITIS (A)  Final   Report Status 03/23/2022 FINAL  Final   Organism ID, Bacteria STAPHYLOCOCCUS CAPITIS  Final      Susceptibility   Staphylococcus capitis - MIC*    CIPROFLOXACIN <=0.5 SENSITIVE Sensitive     ERYTHROMYCIN <=0.25 SENSITIVE Sensitive     GENTAMICIN <=0.5 SENSITIVE Sensitive     OXACILLIN <=0.25 SENSITIVE Sensitive      TETRACYCLINE <=1 SENSITIVE Sensitive     VANCOMYCIN <=0.5 SENSITIVE Sensitive     TRIMETH/SULFA <=10 SENSITIVE Sensitive     CLINDAMYCIN <=0.25 SENSITIVE Sensitive     RIFAMPIN <=0.5 SENSITIVE Sensitive     Inducible Clindamycin NEGATIVE Sensitive     * STAPHYLOCOCCUS CAPITIS  Blood Culture ID Panel (Reflexed)     Status: Abnormal   Collection Time: 03/19/22  9:28 PM  Result Value Ref Range Status   Enterococcus faecalis NOT DETECTED NOT DETECTED Final   Enterococcus Faecium NOT DETECTED NOT DETECTED Final   Listeria monocytogenes NOT DETECTED NOT DETECTED Final   Staphylococcus species DETECTED (A) NOT DETECTED Final    Comment: CRITICAL RESULT CALLED TO, READ BACK BY AND VERIFIED WITH: CAROLYN CHILDS 03/20/22 1457 AMK    Staphylococcus aureus (BCID) NOT DETECTED NOT DETECTED Final   Staphylococcus epidermidis NOT DETECTED NOT DETECTED Final   Staphylococcus lugdunensis NOT DETECTED NOT DETECTED Final   Streptococcus species NOT DETECTED NOT DETECTED Final   Streptococcus agalactiae NOT DETECTED NOT DETECTED Final   Streptococcus pneumoniae NOT DETECTED NOT DETECTED Final   Streptococcus pyogenes NOT DETECTED NOT DETECTED Final   A.calcoaceticus-baumannii NOT DETECTED NOT DETECTED Final   Bacteroides fragilis NOT DETECTED NOT DETECTED Final   Enterobacterales NOT DETECTED NOT DETECTED Final   Enterobacter cloacae complex NOT DETECTED NOT DETECTED Final   Escherichia coli NOT DETECTED NOT DETECTED Final   Klebsiella aerogenes NOT DETECTED NOT DETECTED Final   Klebsiella oxytoca NOT DETECTED NOT DETECTED Final   Klebsiella pneumoniae NOT DETECTED NOT DETECTED Final   Proteus species NOT DETECTED NOT DETECTED Final   Salmonella species NOT DETECTED NOT DETECTED Final   Serratia marcescens NOT DETECTED NOT DETECTED Final   Haemophilus influenzae NOT DETECTED NOT DETECTED Final   Neisseria meningitidis NOT DETECTED NOT DETECTED Final  Pseudomonas aeruginosa NOT DETECTED NOT  DETECTED Final   Stenotrophomonas maltophilia NOT DETECTED NOT DETECTED Final   Candida albicans NOT DETECTED NOT DETECTED Final   Candida auris NOT DETECTED NOT DETECTED Final   Candida glabrata NOT DETECTED NOT DETECTED Final   Candida krusei NOT DETECTED NOT DETECTED Final   Candida parapsilosis NOT DETECTED NOT DETECTED Final   Candida tropicalis NOT DETECTED NOT DETECTED Final   Cryptococcus neoformans/gattii NOT DETECTED NOT DETECTED Final    Comment: Performed at [facility], [address] [place], [place] [address]  Blood Culture (routine x 2)     Status: Abnormal   Collection Time: 03/19/22 10:23 PM   Specimen: BLOOD  Result Value Ref Range Status   Specimen Description   Final    BLOOD BLOOD RIGHT FOREARM Performed at [facility], [address] [place], [place] [address]    Special Requests   Final    BOTTLES DRAWN AEROBIC AND ANAEROBIC Blood Culture adequate volume Performed at [facility], [address] [place], [place] [address]    Culture  Setup Time   Final    GRAM POSITIVE COCCI IN BOTH AEROBIC AND ANAEROBIC BOTTLES CRITICAL RESULT CALLED TO, READ BACK BY AND VERIFIED WITH: ELVERA MADUEME 03/20/22 1545 AMK Performed at [facility], [address] [place], [place] [address]    Culture (A)  Final    STAPHYLOCOCCUS CAPITIS SUSCEPTIBILITIES PERFORMED ON PREVIOUS CULTURE WITHIN THE LAST 5 DAYS. Performed at [facility], [place] [address] [place], [place] [address]    Report Status 03/22/2022 FINAL  Final  Urine Culture     Status: Abnormal   Collection Time: 03/20/22  1:25 AM   Specimen: In/Out Cath Urine  Result Value Ref Range Status   Specimen Description   Final    IN/OUT CATH URINE Performed at [facility], [address] [place], [place] [address]    Special Requests   Final    NONE Performed at [facility], [address] [place], [place] [address]    Culture 4,000  COLONIES/mL STAPHYLOCOCCUS EPIDERMIDIS (A)  Final   Report Status 03/22/2022 FINAL  Final   Organism ID, Bacteria STAPHYLOCOCCUS EPIDERMIDIS (A)  Final      Susceptibility   Staphylococcus epidermidis - MIC*    CIPROFLOXACIN <=0.5 SENSITIVE Sensitive     GENTAMICIN <=0.5 SENSITIVE Sensitive     NITROFURANTOIN <=16 SENSITIVE Sensitive     OXACILLIN <=0.25 SENSITIVE Sensitive     TETRACYCLINE <=1 SENSITIVE Sensitive     VANCOMYCIN 2 SENSITIVE Sensitive     TRIMETH/SULFA 80 RESISTANT Resistant     CLINDAMYCIN >=8 RESISTANT Resistant     RIFAMPIN <=0.5 SENSITIVE Sensitive     Inducible Clindamycin NEGATIVE Sensitive     * 4,000 COLONIES/mL STAPHYLOCOCCUS EPIDERMIDIS  Culture, blood (Routine X 2) w Reflex to ID Panel     Status: None (Preliminary result)   Collection Time: 03/21/22 11:00 AM   Specimen: BLOOD  Result Value Ref Range Status   Specimen Description BLOOD BLOOD RIGHT FOREARM  Final   Special Requests   Final    BOTTLES DRAWN AEROBIC AND ANAEROBIC Blood Culture adequate volume   Culture   Final    NO GROWTH 3 DAYS Performed at [facility], [address], [place], [place] [address]    Report Status PENDING  Incomplete  Culture, blood (Routine X 2) w Reflex to ID Panel     Status: None (Preliminary  result)   Collection Time: 03/22/22 11:53 AM   Specimen: BLOOD RIGHT HAND  Result Value Ref Range Status   Specimen Description BLOOD RIGHT HAND  Final   Special Requests   Final    BOTTLES DRAWN AEROBIC AND ANAEROBIC Blood Culture adequate volume   Culture   Final    NO GROWTH 2 DAYS Performed at [facility], [address], [place], [place] [address]    Report Status PENDING  Incomplete         Radiology Studies: No results found.      Scheduled Meds:  apixaban  10 mg Oral BID   Followed by   Derrill Memo ON 03/29/2022] apixaban  5 mg Oral BID   atorvastatin  80 mg Oral QPM   calcium acetate  2,001 mg Oral TID WC   Chlorhexidine Gluconate  Cloth  6 each Topical Q0600   vitamin D3  1,000 Units Oral Daily   cinacalcet  30 mg Oral Q breakfast   docusate sodium  200 mg Oral BID   [START ON 03/25/2022] epoetin (EPOGEN/PROCRIT) injection  10,000 Units Intravenous Q M,W,F-HD   fluticasone  1 spray Each Nare Daily   gabapentin  100 mg Oral BID   insulin aspart  0-6 Units Subcutaneous TID WC   insulin aspart protamine- aspart  10 Units Subcutaneous BID WC   levETIRAcetam  1,000 mg Oral Daily   levETIRAcetam  250 mg Oral Once per day on Mon Wed Fri   losartan  100 mg Oral QHS   metoprolol succinate  100 mg Oral Q breakfast   pantoprazole  40 mg Oral Daily   polyethylene glycol  17 g Oral Daily   torsemide  100 mg Oral Q breakfast   Continuous Infusions:  sodium chloride 10 mL/hr at 03/21/22 0651    ceFAZolin (ANCEF) IV 1 g (03/23/22 1901)     LOS: 4 days    Time spent: 35 mins     Nolberto Hanlon, MD [facility] Pager [phone]  If 7PM-7AM, please contact night-coverage [email or web address] 03/24/2022, 9:10 AM

## 2022-03-24 NOTE — Progress Notes (Signed)
Date of Admission:  03/19/2022    ID: Cynthia Dean is a 71 y.o. 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)   ESRD (end stage renal disease) (HCC)   Dyslipidemia   Diabetic neuropathy ([place])   Sepsis due to cellulitis (HCC)   Urinary tract infection   Anemia of chronic disease    Subjective: Pt is doing okay C/o heaviness left breast Partner at bed side  Medications:   apixaban  10 mg Oral BID   Followed by   Derrill Memo ON 03/29/2022] apixaban  5 mg Oral BID   atorvastatin  80 mg Oral QPM   calcium acetate  2,001 mg Oral TID WC   Chlorhexidine Gluconate Cloth  6 each Topical Q0600   vitamin D3  1,000 Units Oral Daily   cinacalcet  30 mg Oral Q breakfast   docusate sodium  200 mg Oral BID   [START ON 03/25/2022] epoetin (EPOGEN/PROCRIT) injection  10,000 Units Intravenous Q M,W,F-HD   fluticasone  1 spray Each Nare Daily   gabapentin  100 mg Oral BID   insulin aspart  0-6 Units Subcutaneous TID WC   insulin aspart protamine- aspart  10 Units Subcutaneous BID WC   levETIRAcetam  1,000 mg Oral Daily   levETIRAcetam  250 mg Oral Once per day on Mon Wed Fri   losartan  100 mg Oral QHS   metoprolol succinate  100 mg Oral Q breakfast   pantoprazole  40 mg Oral Daily   polyethylene glycol  17 g Oral Daily   torsemide  100 mg Oral Q breakfast    Objective: Vital signs in last 24 hours: Temp:  [97.9 F (36.6 C)-98.9 F (37.2 C)] 97.9 F (36.6 C) (08/31 1148) Pulse Rate:  [60-72] 68 (08/31 1148) Resp:  [13-20] 16 (08/31 1148) BP: (119-182)/(32-83) 119/32 (08/31 1148) SpO2:  [95 %-100 %] 99 % (08/31 1148) Weight:  [122.5 kg] 122.5 kg (08/30 1814)   PHYSICAL EXAM:  General: Alert, cooperative, no distress, appears stated age.  Lungs: b/l air entry Heart: s1s2 Abdomen: Soft, non-tender,not distended. Bowel sounds  normal. No masses Extremities: atraumatic, no cyanosis. No edema. No clubbing Skin: No rashes or lesions. Or bruising Lymph: Cervical, supraclavicular normal. Neurologic: Grossly non-focal Left breast enlarged , edematous and indurated Left axillary area- serous fluid oozing Lab Results Recent Labs    03/22/22 0156 03/23/22 0647  WBC 9.7 10.4  HGB 8.0* 7.5*  HCT 25.3* 24.3*  NA 136 137  K 3.6 4.4  CL 98 101  CO2 26 26  BUN 32* 38*  CREATININE 7.57* 9.31*    Microbiology: BC- staph capitis  Studies/Results: US BREAST LTD UNI LEFT INC AXILLA  Result Date: 03/24/2022 CLINICAL DATA:  Mass of left breast, assess for abscess. Patient is status post left breast lumpectomy August 11th 2023. EXAM: ULTRASOUND OF THE LEFT BREAST COMPARISON:  Prior films FINDINGS: Targeted ultrasound is performed, showing complicated cystic lesion at the palpable area left breast 1-3 o'clock 12 cm from nipple measuring 14.5 x 6.3 x 9.2 cm. This may represent postsurgical seroma but abscess is not excluded based on ultrasound. IMPRESSION: Benign findings. Large mass at the left breast 1-3 o'clock 12 cm from nipple. This may represent postsurgical seroma but abscess is not excluded based on ultrasound. RECOMMENDATION: Management on clinical basis. I have discussed the findings and recommendations  with the patient. If applicable, a reminder letter will be sent to the patient regarding the next appointment. BI-RADS CATEGORY  2: Benign. Electronically Signed   By: Abelardo Diesel M.D.   On: 03/24/2022 10:15      Assessment/Plan: 71 yr female with ESRD, left breast ca s/p partial mastectomy and left axillary dissection on 8/11 presenting fever and confusion and weakness ? Staph capitis bacteremia- likely related to the left breast infection  Ca breast left s/p partial mastectomy and left axillary dissection   Left breast very enlarged and indurated- recent partial mastectomy- ultrasound shows a collection of 14X9   Surgical consult appreciated On IV cefazolin She needs to wear support bra- partner bringing it from home   Encephalopathy related to above and possibly missing dialysis   B/l small PE- no hypoxia   ESRD on dialysis  Discussed the management with patient, partner and care team

## 2022-03-24 NOTE — Progress Notes (Addendum)
[facility]  ROUNDING NOTE   Subjective:   Cynthia Dean is a [age]-year-old female with past medical history including COPD, diabetes, carotid stenosis, hypertension, and end-stage renal disease on hemodialysis.  Patient presents to the emergency department with complaints of weakness, fatigue and fever.  Patient has been admitted for ESRD on hemodialysis ([place]) [N18.6, Z99.2] Acute pulmonary embolism without acute cor pulmonale ([place]) [I26.99] Sepsis with acute hypoxic respiratory failure and septic shock, due to unspecified organism ([place]) [A41.9, R65.21, J96.01]  Patient is known to our practice and receives outpatient dialysis treatments at [facility] on a TTS schedule, supervised by Dr. Candiss Norse.   Patient seen laying in bed, breakfast tray at bedside Nurse at bedside, assisting patient with medication Nurse reports increased drainage from breast  Objective:  Vital signs in last 24 hours:  Temp:  [97.7 F (36.5 C)-98.9 F (37.2 C)] 97.9 F (36.6 C) (08/31 1148) Pulse Rate:  [60-72] 68 (08/31 1148) Resp:  [13-22] 16 (08/31 1148) BP: (119-182)/(32-83) 119/32 (08/31 1148) SpO2:  [95 %-100 %] 99 % (08/31 1148) Weight:  [122.5 kg] 122.5 kg (08/30 1814)  Weight change:  Filed Weights   03/21/22 2239 03/22/22 0448 03/23/22 1814  Weight: 123 kg 116.3 kg 122.5 kg    Intake/Output: I/O last 3 completed shifts: In: -  Out: 2000 [Other:2000]   Intake/Output this shift:  No intake/output data recorded.  Physical Exam: General: NAD, resting comfortably  Head: Normocephalic, atraumatic. Moist oral mucosal membranes  Eyes: Anicteric  Lungs:  Clear to auscultation, normal effort  Heart: Regular rate and rhythm  Abdomen:  Soft, nontender, obese  Extremities: 1+ peripheral edema.  Neurologic: Alert, oriented to person  Skin: No lesions  Access: Left aVF    Basic Metabolic Panel: Recent Labs  Lab 03/19/22 2127 03/20/22 0448 03/21/22 0655  03/22/22 0156 03/23/22 0647  NA 138 136 135 136 137  K 4.3 3.8 4.6 3.6 4.4  CL 100 109 101 98 101  CO2 24 21* '22 26 26  '$ GLUCOSE 94 105* 84 91 101*  BUN 48* 47* 60* 32* 38*  CREATININE 11.43* 9.97* 12.47* 7.57* 9.31*  CALCIUM 9.6 7.9* 8.7* 8.7* 9.5     Liver Function Tests: Recent Labs  Lab 03/19/22 2127  AST 15  ALT 7  ALKPHOS 89  BILITOT 1.2  PROT 7.2  ALBUMIN 3.4*    No results for input(s): "LIPASE", "AMYLASE" in the last 168 hours. No results for input(s): "AMMONIA" in the last 168 hours.  CBC: Recent Labs  Lab 03/19/22 2127 03/20/22 0448 03/20/22 1901 03/21/22 0655 03/21/22 1847 03/22/22 0156 03/23/22 0647  WBC 16.5* 14.4*  --  9.6  --  9.7 10.4  NEUTROABS 13.0*  --   --   --   --   --   --   HGB 8.1* 6.6* 6.9* 7.1* 7.5* 8.0* 7.5*  HCT 26.1* 21.8* 22.3* 22.7* 23.8* 25.3* 24.3*  MCV 90.6 93.2  --  87.3  --  86.6 87.7  PLT 214 163  --  168  --  210 235     Cardiac Enzymes: No results for input(s): "CKTOTAL", "CKMB", "CKMBINDEX", "TROPONINI" in the last 168 hours.  BNP: Invalid input(s): "POCBNP"  CBG: Recent Labs  Lab 03/23/22 1152 03/23/22 1853 03/23/22 2017 03/24/22 0805 03/24/22 1149  GLUCAP 113* 102* 141* 129* 132*     Microbiology: Results for orders placed or performed during the hospital encounter of 03/19/22  Resp Panel by RT-PCR (Flu A&B,  Covid) Anterior Nasal Swab     Status: None   Collection Time: 03/19/22  9:28 PM   Specimen: Anterior Nasal Swab  Result Value Ref Range Status   SARS Coronavirus 2 by RT PCR NEGATIVE NEGATIVE Final    Comment: (NOTE) SARS-CoV-2 target nucleic acids are NOT DETECTED.  The SARS-CoV-2 RNA is generally detectable in upper respiratory specimens during the acute phase of infection. The lowest concentration of SARS-CoV-2 viral copies this assay can detect is 138 copies/mL. A negative result does not preclude SARS-Cov-2 infection and should not be used as the sole basis for treatment or other  patient management decisions. A negative result may occur with  improper specimen collection/handling, submission of specimen other than nasopharyngeal swab, presence of viral mutation(s) within the areas targeted by this assay, and inadequate number of viral copies(<138 copies/mL). A negative result must be combined with clinical observations, patient history, and epidemiological information. The expected result is Negative.  Fact Sheet for Patients:  EntrepreneurPulse.com.au  Fact Sheet for Healthcare Providers:  IncredibleEmployment.be  This test is no t yet approved or cleared by the [place] FDA and  has been authorized for detection and/or diagnosis of SARS-CoV-2 by FDA under an Emergency Use Authorization (EUA). This EUA will remain  in effect (meaning this test can be used) for the duration of the COVID-19 declaration under Section 564(b)(1) of the Act, 21 U.S.C.section 360bbb-3(b)(1), unless the authorization is terminated  or revoked sooner.       Influenza A by PCR NEGATIVE NEGATIVE Final   Influenza B by PCR NEGATIVE NEGATIVE Final    Comment: (NOTE) The Xpert Xpress SARS-CoV-2/FLU/RSV plus assay is intended as an aid in the diagnosis of influenza from Nasopharyngeal swab specimens and should not be used as a sole basis for treatment. Nasal washings and aspirates are unacceptable for Xpert Xpress SARS-CoV-2/FLU/RSV testing.  Fact Sheet for Patients: EntrepreneurPulse.com.au  Fact Sheet for Healthcare Providers: IncredibleEmployment.be  This test is not yet approved or cleared by the [place] FDA and has been authorized for detection and/or diagnosis of SARS-CoV-2 by FDA under an Emergency Use Authorization (EUA). This EUA will remain in effect (meaning this test can be used) for the duration of the COVID-19 declaration under Section 564(b)(1) of the Act, 21 U.S.C. section  360bbb-3(b)(1), unless the authorization is terminated or revoked.  Performed at [facility], [address] [place], [place] [address]   Blood Culture (routine x 2)     Status: Abnormal   Collection Time: 03/19/22  9:28 PM   Specimen: BLOOD  Result Value Ref Range Status   Specimen Description   Final    BLOOD RIGHT ANTECUBITAL Performed at [facility], [address], [place], [place] [address]    Special Requests   Final    BOTTLES DRAWN AEROBIC AND ANAEROBIC Blood Culture adequate volume Performed at [facility], [address], [place], [place] [address]    Culture  Setup Time   Final    GRAM POSITIVE COCCI IN BOTH AEROBIC AND ANAEROBIC BOTTLES CRITICAL RESULT CALLED TO, READ BACK BY AND VERIFIED WITH: CAROLYN CHILDS 03/20/22 1457 AMK Performed at [facility], [address] [place], [place] [address]    Culture STAPHYLOCOCCUS CAPITIS (A)  Final   Report Status 03/23/2022 FINAL  Final   Organism ID, Bacteria STAPHYLOCOCCUS CAPITIS  Final      Susceptibility   Staphylococcus capitis - MIC*    CIPROFLOXACIN <=0.5 SENSITIVE Sensitive  ERYTHROMYCIN <=0.25 SENSITIVE Sensitive     GENTAMICIN <=0.5 SENSITIVE Sensitive     OXACILLIN <=0.25 SENSITIVE Sensitive     TETRACYCLINE <=1 SENSITIVE Sensitive     VANCOMYCIN <=0.5 SENSITIVE Sensitive     TRIMETH/SULFA <=10 SENSITIVE Sensitive     CLINDAMYCIN <=0.25 SENSITIVE Sensitive     RIFAMPIN <=0.5 SENSITIVE Sensitive     Inducible Clindamycin NEGATIVE Sensitive     * STAPHYLOCOCCUS CAPITIS  Blood Culture ID Panel (Reflexed)     Status: Abnormal   Collection Time: 03/19/22  9:28 PM  Result Value Ref Range Status   Enterococcus faecalis NOT DETECTED NOT DETECTED Final   Enterococcus Faecium NOT DETECTED NOT DETECTED Final   Listeria monocytogenes NOT DETECTED NOT DETECTED Final   Staphylococcus species DETECTED (A) NOT DETECTED Final    Comment: CRITICAL RESULT CALLED TO, READ BACK  BY AND VERIFIED WITH: CAROLYN CHILDS 03/20/22 1457 AMK    Staphylococcus aureus (BCID) NOT DETECTED NOT DETECTED Final   Staphylococcus epidermidis NOT DETECTED NOT DETECTED Final   Staphylococcus lugdunensis NOT DETECTED NOT DETECTED Final   Streptococcus species NOT DETECTED NOT DETECTED Final   Streptococcus agalactiae NOT DETECTED NOT DETECTED Final   Streptococcus pneumoniae NOT DETECTED NOT DETECTED Final   Streptococcus pyogenes NOT DETECTED NOT DETECTED Final   A.calcoaceticus-baumannii NOT DETECTED NOT DETECTED Final   Bacteroides fragilis NOT DETECTED NOT DETECTED Final   Enterobacterales NOT DETECTED NOT DETECTED Final   Enterobacter cloacae complex NOT DETECTED NOT DETECTED Final   Escherichia coli NOT DETECTED NOT DETECTED Final   Klebsiella aerogenes NOT DETECTED NOT DETECTED Final   Klebsiella oxytoca NOT DETECTED NOT DETECTED Final   Klebsiella pneumoniae NOT DETECTED NOT DETECTED Final   Proteus species NOT DETECTED NOT DETECTED Final   Salmonella species NOT DETECTED NOT DETECTED Final   Serratia marcescens NOT DETECTED NOT DETECTED Final   Haemophilus influenzae NOT DETECTED NOT DETECTED Final   Neisseria meningitidis NOT DETECTED NOT DETECTED Final   Pseudomonas aeruginosa NOT DETECTED NOT DETECTED Final   Stenotrophomonas maltophilia NOT DETECTED NOT DETECTED Final   Candida albicans NOT DETECTED NOT DETECTED Final   Candida auris NOT DETECTED NOT DETECTED Final   Candida glabrata NOT DETECTED NOT DETECTED Final   Candida krusei NOT DETECTED NOT DETECTED Final   Candida parapsilosis NOT DETECTED NOT DETECTED Final   Candida tropicalis NOT DETECTED NOT DETECTED Final   Cryptococcus neoformans/gattii NOT DETECTED NOT DETECTED Final    Comment: Performed at [facility], [address] [place], [place] [address]  Blood Culture (routine x 2)     Status: Abnormal   Collection Time: 03/19/22 10:23 PM   Specimen: BLOOD  Result Value Ref Range Status    Specimen Description   Final    BLOOD BLOOD RIGHT FOREARM Performed at [facility], [address] [place], [place] [address]    Special Requests   Final    BOTTLES DRAWN AEROBIC AND ANAEROBIC Blood Culture adequate volume Performed at [facility], [address] [place], [place] [address]    Culture  Setup Time   Final    GRAM POSITIVE COCCI IN BOTH AEROBIC AND ANAEROBIC BOTTLES CRITICAL RESULT CALLED TO, READ BACK BY AND VERIFIED WITH: ELVERA MADUEME 03/20/22 1545 AMK Performed at [facility], [address] [place], [place] [address]    Culture (A)  Final    STAPHYLOCOCCUS CAPITIS SUSCEPTIBILITIES PERFORMED ON PREVIOUS CULTURE WITHIN THE LAST 5 DAYS. Performed at [facility],  [address], [place], [place] [address]    Report Status 03/22/2022 FINAL  Final  Urine Culture     Status: Abnormal   Collection Time: 03/20/22  1:25 AM   Specimen: In/Out Cath Urine  Result Value Ref Range Status   Specimen Description   Final    IN/OUT CATH URINE Performed at [facility], [address] [place], [place] [address]    Special Requests   Final    NONE Performed at [facility], [address] [place], [place] [address]    Culture 4,000 COLONIES/mL STAPHYLOCOCCUS EPIDERMIDIS (A)  Final   Report Status 03/22/2022 FINAL  Final   Organism ID, Bacteria STAPHYLOCOCCUS EPIDERMIDIS (A)  Final      Susceptibility   Staphylococcus epidermidis - MIC*    CIPROFLOXACIN <=0.5 SENSITIVE Sensitive     GENTAMICIN <=0.5 SENSITIVE Sensitive     NITROFURANTOIN <=16 SENSITIVE Sensitive     OXACILLIN <=0.25 SENSITIVE Sensitive     TETRACYCLINE <=1 SENSITIVE Sensitive     VANCOMYCIN 2 SENSITIVE Sensitive     TRIMETH/SULFA 80 RESISTANT Resistant     CLINDAMYCIN >=8 RESISTANT Resistant     RIFAMPIN <=0.5 SENSITIVE Sensitive     Inducible Clindamycin NEGATIVE Sensitive     * 4,000 COLONIES/mL STAPHYLOCOCCUS EPIDERMIDIS  Culture, blood  (Routine X 2) w Reflex to ID Panel     Status: None (Preliminary result)   Collection Time: 03/21/22 11:00 AM   Specimen: BLOOD  Result Value Ref Range Status   Specimen Description BLOOD BLOOD RIGHT FOREARM  Final   Special Requests   Final    BOTTLES DRAWN AEROBIC AND ANAEROBIC Blood Culture adequate volume   Culture   Final    NO GROWTH 3 DAYS Performed at [facility], [address] [place], [place] [address]    Report Status PENDING  Incomplete  Culture, blood (Routine X 2) w Reflex to ID Panel     Status: None (Preliminary result)   Collection Time: 03/22/22 11:53 AM   Specimen: BLOOD RIGHT HAND  Result Value Ref Range Status   Specimen Description BLOOD RIGHT HAND  Final   Special Requests   Final    BOTTLES DRAWN AEROBIC AND ANAEROBIC Blood Culture adequate volume   Culture   Final    NO GROWTH 2 DAYS Performed at [facility], [address] [place], [place] [address]    Report Status PENDING  Incomplete    Coagulation Studies: No results for input(s): "LABPROT", "INR" in the last 72 hours.   Urinalysis: No results for input(s): "COLORURINE", "LABSPEC", "PHURINE", "GLUCOSEU", "HGBUR", "BILIRUBINUR", "KETONESUR", "PROTEINUR", "UROBILINOGEN", "NITRITE", "LEUKOCYTESUR" in the last 72 hours.  Invalid input(s): "APPERANCEUR"     Imaging: US BREAST LTD UNI LEFT INC AXILLA  Result Date: 03/24/2022 CLINICAL DATA:  Mass of left breast, assess for abscess. Patient is status post left breast lumpectomy August 11th 2023. EXAM: ULTRASOUND OF THE LEFT BREAST COMPARISON:  Prior films FINDINGS: Targeted ultrasound is performed, showing complicated cystic lesion at the palpable area left breast 1-3 o'clock 12 cm from nipple measuring 14.5 x 6.3 x 9.2 cm. This may represent postsurgical seroma but abscess is not excluded based on ultrasound. IMPRESSION: Benign findings. Large mass at the left breast 1-3 o'clock 12 cm from nipple. This may represent postsurgical  seroma but abscess is not excluded based on ultrasound. RECOMMENDATION: Management on clinical basis. I have discussed the findings and recommendations with the patient. If applicable, a reminder letter  will be sent to the patient regarding the next appointment. BI-RADS CATEGORY  2: Benign. Electronically Signed   By: Abelardo Diesel M.D.   On: 03/24/2022 10:15     Medications:    sodium chloride 10 mL/hr at 03/21/22 4665    ceFAZolin (ANCEF) IV 1 g (03/23/22 1901)    apixaban  10 mg Oral BID   Followed by   Derrill Memo ON 03/29/2022] apixaban  5 mg Oral BID   atorvastatin  80 mg Oral QPM   calcium acetate  2,001 mg Oral TID WC   Chlorhexidine Gluconate Cloth  6 each Topical Q0600   vitamin D3  1,000 Units Oral Daily   cinacalcet  30 mg Oral Q breakfast   docusate sodium  200 mg Oral BID   [START ON 03/25/2022] epoetin (EPOGEN/PROCRIT) injection  10,000 Units Intravenous Q M,W,F-HD   fluticasone  1 spray Each Nare Daily   gabapentin  100 mg Oral BID   insulin aspart  0-6 Units Subcutaneous TID WC   insulin aspart protamine- aspart  10 Units Subcutaneous BID WC   levETIRAcetam  1,000 mg Oral Daily   levETIRAcetam  250 mg Oral Once per day on Mon Wed Fri   losartan  100 mg Oral QHS   metoprolol succinate  100 mg Oral Q breakfast   pantoprazole  40 mg Oral Daily   polyethylene glycol  17 g Oral Daily   torsemide  100 mg Oral Q breakfast   sodium chloride, acetaminophen **OR** acetaminophen, diphenoxylate-atropine, glucose, ipratropium-albuterol, lidocaine-prilocaine, magnesium hydroxide, metoCLOPramide (REGLAN) injection, oxyCODONE, traZODone  Assessment/ Plan:  Cynthia Dean is a 71 y.o.  female ast medical history including COPD, diabetes, carotid stenosis, hypertension, and end-stage renal disease on hemodialysis.  Patient presents to the emergency department with complaints of weakness, fatigue and fever.  Patient has been admitted for ESRD on hemodialysis ([place]) [N18.6,  Z99.2] Acute pulmonary embolism without acute cor pulmonale ([place]) [I26.99] Sepsis with acute hypoxic respiratory failure and septic shock, due to unspecified organism ([place]) [A41.9, R65.21, J96.01]  [facility] /MWF/left aVF  End-stage renal disease on hemodialysis.  Will maintain outpatient schedule if possible.  Patient scheduled to receive dialysis later today.  Next treatment scheduled for Thursday.  2. Anemia of chronic kidney disease Lab Results  Component Value Date   HGB 7.5 (L) 03/23/2022    Patient receives Volo outpatient.  Hemoglobin below target.  Continue EPO with dialysis treatment.  3. Secondary Hyperparathyroidism: with outpatient labs: PTH 613, phosphorus 4.8, calcium 9.0 on 03/07/2022.   Lab Results  Component Value Date   PTH 131 (H) 03/14/2018   CALCIUM 9.5 03/23/2022   PHOS 3.0 10/25/2019  Calcium and phosphorus within acceptable range. Continue calcium acetate with meals.  4. Diabetes mellitus type II with chronic kidney disease: insulin dependent. Home regimen includes Novolin. Most recent hemoglobin A1c is 7.5 on 01/29/20.   5.  Hypertension with chronic kidney disease.  Home regimen includes losartan, metoprolol, and torsemide.  Patient currently receiving these medications.  Blood pressure stable   LOS: 4   8/31/20231:06 PM

## 2022-03-24 NOTE — Progress Notes (Signed)
ANTICOAGULATION CONSULT NOTE  Pharmacy Consult for IV heparin Indication: pulmonary embolus  Allergies  Allergen Reactions   Oxycodone Nausea And Vomiting   Oxycodone-Acetaminophen Other (See Comments)   Wound Dressing Adhesive    Hydrocodone     Intolerant more than allergic   Tape Itching    Skin Dermatitis/itching (tape adhesive) Skin Dermatitis/itching (tape adhesive)   Tapentadol Itching    Skin Dermatitis/itching (tape adhesive) Skin Dermatitis/itching (tape adhesive)     Patient Measurements: Height: '5\' 9"'$  (175.3 cm) Weight: 122.5 kg (270 lb 1 oz) IBW/kg (Calculated) : 66.2 Heparin Dosing Weight: 123 kg  Vital Signs: Temp: 97.9 F (36.6 C) (08/31 1148) Temp Source: Oral (08/31 1148) BP: 119/32 (08/31 1148) Pulse Rate: 68 (08/31 1148)  Labs: Recent Labs    03/21/22 1847 03/22/22 0156 03/22/22 1153 03/23/22 0647  HGB 7.5* 8.0*  --  7.5*  HCT 23.8* 25.3*  --  24.3*  PLT  --  210  --  235  HEPARINUNFRC  --  0.20* <0.10*  --   CREATININE  --  7.57*  --  9.31*    Estimated Creatinine Clearance: 7.8 mL/min (A) (by C-G formula based on SCr of 9.31 mg/dL (H)).   Medications:  Medications Prior to Admission  Medication Sig Dispense Refill Last Dose   acetaminophen (TYLENOL) 500 MG tablet Take 500 mg by mouth every 6 (six) hours as needed.   prn at prn   aspirin EC 81 MG tablet Take 81 mg by mouth daily. Swallow whole.   Past Week   atorvastatin (LIPITOR) 80 MG tablet Take 80 mg by mouth every evening.    Past Week   calcium acetate (PHOSLO) 667 MG capsule Take 3 capsules (2,001 mg total) by mouth 3 (three) times daily with meals.   Past Week   cholecalciferol (VITAMIN D) 25 MCG tablet Take 1 tablet (1,000 Units total) by mouth daily.   Past Week   cinacalcet (SENSIPAR) 30 MG tablet Take 30 mg by mouth daily at 6 (six) AM. Take with largest meal   Past Week   clopidogrel (PLAVIX) 75 MG tablet TAKE ONE TABLET BY MOUTH ONCE DAILY (Patient taking differently: Take  75 mg by mouth daily.) 30 tablet 5 Past Week   fluticasone (FLONASE) 50 MCG/ACT nasal spray Place 1 spray into both nostrils daily.   Past Week   gabapentin (NEURONTIN) 100 MG capsule Take 1 capsule (100 mg total) by mouth at bedtime. (Patient taking differently: Take 100 mg by mouth 2 (two) times daily.)   Past Week   glucose 4 GM chewable tablet Chew 1 tablet by mouth as needed for low blood sugar.   prn at prn   Ipratropium-Albuterol (COMBIVENT) 20-100 MCG/ACT AERS respimat Inhale 1 puff into the lungs every 6 (six) hours. (Patient taking differently: Inhale 1 puff into the lungs 2 (two) times daily.) 4 g 0 Past Week   levETIRAcetam (KEPPRA) 1000 MG tablet Take 1 tablet (1,000 mg total) by mouth daily. 30 tablet 0 Past Week   levETIRAcetam (KEPPRA) 250 MG tablet Take 250 mg by mouth 3 (three) times a week.   Past Week   lidocaine-prilocaine (EMLA) cream Apply 1 application topically as needed (for port access).    Past Week   losartan (COZAAR) 100 MG tablet Take 100 mg by mouth at bedtime.   Past Week   metoprolol succinate (TOPROL-XL) 100 MG 24 hr tablet Take 100 mg by mouth daily. Take with or immediately following a meal.  Past Week   NOVOLIN 70/30 (70-30) 100 UNIT/ML injection SMARTSIG:28 Unit(s) SUB-Q Twice Daily   Past Week   omeprazole (PRILOSEC) 20 MG capsule Take 20 mg by mouth 2 (two) times daily.    Past Week   torsemide (DEMADEX) 100 MG tablet Take 100 mg by mouth daily.   Past Week   diphenoxylate-atropine (LOMOTIL) 2.5-0.025 MG tablet SMARTSIG:1 Tablet(s) By Mouth Every 12 Hours PRN   prn at prn   loperamide (IMODIUM) 2 MG capsule Take 2 mg by mouth 4 (four) times daily as needed.   prn at prn   oxyCODONE (OXY IR/ROXICODONE) 5 MG immediate release tablet Take 5 mg by mouth every 6 (six) hours as needed.   prn at prn   Scheduled:   atorvastatin  80 mg Oral QPM   calcium acetate  2,001 mg Oral TID WC   Chlorhexidine Gluconate Cloth  6 each Topical Q0600   vitamin D3  1,000 Units  Oral Daily   cinacalcet  30 mg Oral Q breakfast   docusate sodium  200 mg Oral BID   [START ON 03/25/2022] epoetin (EPOGEN/PROCRIT) injection  10,000 Units Intravenous Q M,W,F-HD   fluticasone  1 spray Each Nare Daily   gabapentin  100 mg Oral BID   insulin aspart  0-6 Units Subcutaneous TID WC   insulin aspart protamine- aspart  10 Units Subcutaneous BID WC   levETIRAcetam  1,000 mg Oral Daily   levETIRAcetam  250 mg Oral Once per day on Mon Wed Fri   losartan  100 mg Oral QHS   metoprolol succinate  100 mg Oral Q breakfast   pantoprazole  40 mg Oral Daily   polyethylene glycol  17 g Oral Daily   torsemide  100 mg Oral Q breakfast   Infusions:   sodium chloride 10 mL/hr at 03/21/22 0651    ceFAZolin (ANCEF) IV 1 g (03/23/22 1901)   PRN: sodium chloride, acetaminophen **OR** acetaminophen, diphenoxylate-atropine, glucose, iohexol, ipratropium-albuterol, lidocaine-prilocaine, magnesium hydroxide, metoCLOPramide (REGLAN) injection, oxyCODONE, traZODone Anti-infectives (From admission, onward)    Start     Dose/Rate Route Frequency Ordered Stop   03/23/22 1800  ceFAZolin (ANCEF) IVPB 1 g/50 mL premix        1 g 100 mL/hr over 30 Minutes Intravenous Daily-1800 03/23/22 1243     03/20/22 1615  doxycycline (VIBRAMYCIN) 100 mg in sodium chloride 0.9 % 250 mL IVPB  Status:  Discontinued        100 mg 125 mL/hr over 120 Minutes Intravenous Every 12 hours 03/20/22 1604 03/23/22 1242   03/20/22 1000  cefTRIAXone (ROCEPHIN) 2 g in sodium chloride 0.9 % 100 mL IVPB  Status:  Discontinued        2 g 200 mL/hr over 30 Minutes Intravenous Every 24 hours 03/20/22 0148 03/23/22 1242   03/20/22 0200  cefTRIAXone (ROCEPHIN) 2 g in sodium chloride 0.9 % 100 mL IVPB  Status:  Discontinued        2 g 200 mL/hr over 30 Minutes Intravenous Every 24 hours 03/20/22 0147 03/20/22 0148   03/19/22 2200  vancomycin (VANCOCIN) IVPB 1000 mg/200 mL premix        1,000 mg 200 mL/hr over 60 Minutes Intravenous  Once  03/19/22 2159 03/19/22 2329   03/19/22 2200  ceFEPIme (MAXIPIME) 2 g in sodium chloride 0.9 % 100 mL IVPB        2 g 200 mL/hr over 30 Minutes Intravenous  Once 03/19/22 2159 03/19/22 2322  Assessment: 71 year old female admitted with pulmonary embolism. Receiving procedure 9/1 to investigate CT scan.   Hgb low 2/2 CKD related anemia.  Last Eliquis dose 8/31 1000   Goal of Therapy:  Heparin level 0.3-0.7 units/ml Monitor platelets by anticoagulation protocol: Yes   Plan:  Restart heparin infusion at 1500 units/hr 8/31 at 2200 Check anti-Xa level in 8 hours and daily while on heparin Continue to monitor H&H and platelets   Glean Salvo, PharmD Clinical Pharmacist  03/24/2022 3:40 PM

## 2022-03-24 NOTE — Consult Note (Signed)
Patient ID: Cynthia Dean, female   DOB: 05/18/1951, 71 y.o.   MRN: [id number]  HPI Cynthia Dean is a 71 y.o. female seen in consultation at the request of Dr Kurtis Bushman.  Does have a significant history consistent of COPD, carotid stenosis, diabetes, end-stage renal disease on hemodialysis, hypertension.  Recently underwent left breast lumpectomy with sentinel lymph node biopsy by Dr. Otto Herb at [facility] 3 weeks ago.  She presented 5 days ago with generalized weakness, fatigue and sepsis.  He was also febrile at that time.  She was appropriately resuscitated and antibiotics were started.  Significant work-up was performed including a CT a of the chest that I have personally reviewed.  There is evidence of PE.  There is also evidence of a left breast collection but it is not the right exam to evaluate the specific left breast.  She Did have a recent ultrasound that I have personally reviewed that was also equivocal but shows a cystic lesionl. It is clear is that her left breast has significant enlargement and is at least twice the size of the right breast.  Culture was significant for Staph capitis  HPI  Past Medical History:  Diagnosis Date   Acute on chronic respiratory failure with hypoxia ([employer]) 07/30/2019   Acute respiratory failure with hypoxia ([place]) 12/31/2018   Carotid arterial disease ([place])    a. 02/2018 < bilat ICA stenosis.   COPD (chronic obstructive pulmonary disease) ([facility])    Coronary artery disease    a. 11/2015 MV: EF 57%, no ischemia/infarct.   Cryptogenic stroke ([place])    a. 10/2017 s/p implantable loop recorder (no Afib to date).   Diabetes mellitus without complication ([place])    Diarrhea    ESRD on dialysis [facility])    GI bleed    a. 01/2019 EGD/Colonoscopy: Mild gastritis. Colitis.   Heart murmur    a. 12/2018 Echo: EF 55-60%, impaired relaxation. Mod dil LA. Mildly dil RA. Mild Ca2+ of AoV.   Hematemesis 06/27/2017   History of 2019 novel coronavirus disease (COVID-19)  07/30/2019   Hyperkalemia    Hyperlipidemia    Hypertension    Intractable nausea and vomiting 08/08/2015   Nausea & vomiting 05/30/2018   Nausea and vomiting 05/29/2018   Pneumonia due to COVID-19 virus     Past Surgical History:  Procedure Laterality Date   A/V FISTULAGRAM Left 12/20/2016   Procedure: A/V Fistulagram;  Surgeon: Katha Cabal, MD;  Location: [place] [facility];  Service: Cardiovascular;  Laterality: Left;   A/V SHUNT INTERVENTION N/A 12/20/2016   Procedure: A/V Shunt Intervention;  Surgeon: Katha Cabal, MD;  Location: [place] [facility];  Service: Cardiovascular;  Laterality: N/A;   A/V SHUNTOGRAM Left 09/11/2017   Procedure: A/V SHUNTOGRAM;  Surgeon: Algernon Huxley, MD;  Location: [place] [facility];  Service: Cardiovascular;  Laterality: Left;   A/V SHUNTOGRAM Left 11/21/2019   Procedure: A/V SHUNTOGRAM;  Surgeon: Algernon Huxley, MD;  Location: [place] [facility];  Service: Cardiovascular;  Laterality: Left;   BREAST BIOPSY Bilateral 07/19/2000   neg   BREAST BIOPSY Left 11/03/2021   [place] bx mass at 3:00, venus marker, axilla bx-hyrdo marker, path pending   COLONOSCOPY WITH PROPOFOL N/A 02/16/2019   Procedure: COLONOSCOPY WITH PROPOFOL;  Surgeon: Toledo, Benay Pike, MD;  Location: [facility];  Service: Gastroenterology;  Laterality: N/A;   ESOPHAGOGASTRODUODENOSCOPY (EGD) WITH PROPOFOL N/A 02/16/2019   Procedure: ESOPHAGOGASTRODUODENOSCOPY (EGD) WITH PROPOFOL;  Surgeon: [place], [place],  MD;  Location: [facility];  Service: Gastroenterology;  Laterality: N/A;   LOOP RECORDER INSERTION N/A 11/16/2017   Procedure: LOOP RECORDER INSERTION;  Surgeon: Deboraha Sprang, MD;  Location: [place] [facility];  Service: Cardiovascular;  Laterality: N/A;   PERIPHERAL VASCULAR CATHETERIZATION Left 02/02/2015   Procedure: A/V Shuntogram/Fistulagram;  Surgeon: Algernon Huxley, MD;  Location: [place] [facility];  Service: Cardiovascular;  Laterality: Left;    PERIPHERAL VASCULAR CATHETERIZATION Left 02/02/2015   Procedure: A/V Shunt Intervention;  Surgeon: Algernon Huxley, MD;  Location: [place] [facility];  Service: Cardiovascular;  Laterality: Left;   PERIPHERAL VASCULAR CATHETERIZATION Left 03/09/2015   Procedure: A/V Shuntogram/Fistulagram;  Surgeon: Algernon Huxley, MD;  Location: [place] [facility];  Service: Cardiovascular;  Laterality: Left;   PERIPHERAL VASCULAR CATHETERIZATION N/A 03/09/2015   Procedure: A/V Shunt Intervention;  Surgeon: Algernon Huxley, MD;  Location: [place] [facility];  Service: Cardiovascular;  Laterality: N/A;   PERIPHERAL VASCULAR CATHETERIZATION Left 04/17/2015   Procedure: Upper Extremity Angiography;  Surgeon: Algernon Huxley, MD;  Location: [place] [facility];  Service: Cardiovascular;  Laterality: Left;   PERIPHERAL VASCULAR CATHETERIZATION  04/17/2015   Procedure: Upper Extremity Intervention;  Surgeon: Algernon Huxley, MD;  Location: [place] [facility];  Service: Cardiovascular;;   PERIPHERAL VASCULAR CATHETERIZATION N/A 08/10/2015   Procedure: A/V Shuntogram/Fistulagram;  Surgeon: Algernon Huxley, MD;  Location: [place] [facility];  Service: Cardiovascular;  Laterality: N/A;   PERIPHERAL VASCULAR CATHETERIZATION N/A 08/10/2015   Procedure: A/V Shunt Intervention;  Surgeon: Algernon Huxley, MD;  Location: [place] [facility];  Service: Cardiovascular;  Laterality: N/A;   PERIPHERAL VASCULAR CATHETERIZATION Left 04/11/2016   Procedure: A/V Shuntogram/Fistulagram;  Surgeon: Algernon Huxley, MD;  Location: [place] [facility];  Service: Cardiovascular;  Laterality: Left;   TEE WITHOUT CARDIOVERSION N/A 11/16/2017   Procedure: TRANSESOPHAGEAL ECHOCARDIOGRAM (TEE);  Surgeon: Minna Merritts, MD;  Location: [facility];  Service: Cardiovascular;  Laterality: N/A;    Family History  Problem Relation Age of Onset   Breast cancer Father 61   Stroke Mother    Heart attack Mother    Heart Problems Sister    Breast cancer Cousin 24        1 st cousin. Maternal     Social History Social History   Tobacco Use   Smoking status: Former    Packs/day: 0.00    Types: Cigarettes   Smokeless tobacco: Never  Vaping Use   Vaping Use: Never used  Substance Use Topics   Alcohol use: No   Drug use: No    Allergies  Allergen Reactions   Oxycodone Nausea And Vomiting   Oxycodone-Acetaminophen Other (See Comments)   Wound Dressing Adhesive    Hydrocodone     Intolerant more than allergic   Tape Itching    Skin Dermatitis/itching (tape adhesive) Skin Dermatitis/itching (tape adhesive)   Tapentadol Itching    Skin Dermatitis/itching (tape adhesive) Skin Dermatitis/itching (tape adhesive)     Current Facility-Administered Medications  Medication Dose Route Frequency Provider Last Rate Last Admin   0.9 %  sodium chloride infusion   Intravenous PRN Wyvonnia Dusky, MD 10 mL/hr at 03/21/22 0651 Infusion Verify at 03/21/22 0651   acetaminophen (TYLENOL) tablet 650 mg  650 mg Oral Q6H PRN Mansy, Jan A, MD   650 mg at 03/23/22 0501   Or   acetaminophen (TYLENOL) suppository 650 mg  650 mg Rectal Q6H PRN Mansy,  Arvella Merles, MD       apixaban Arne [place]) tablet 10 mg  10 mg Oral BID Darrick Penna, RPH   10 mg at 03/24/22 1039   Followed by   Derrill Memo ON 03/29/2022] apixaban (ELIQUIS) tablet 5 mg  5 mg Oral BID Darrick Penna, RPH       atorvastatin (LIPITOR) tablet 80 mg  80 mg Oral QPM Mansy, Jan A, MD   80 mg at 03/23/22 2100   calcium acetate (PHOSLO) capsule 2,001 mg  2,001 mg Oral TID WC Mansy, Jan A, MD   2,001 mg at 03/24/22 1305   ceFAZolin (ANCEF) IVPB 1 g/50 mL premix  1 g Intravenous q1800 Tsosie Billing, MD 100 mL/hr at 03/23/22 1901 1 g at 03/23/22 1901   Chlorhexidine Gluconate Cloth 2 % PADS 6 each  6 each Topical Q0600 Colon Flattery, NP   6 each at 03/24/22 0850   cholecalciferol (VITAMIN D3) 25 MCG (1000 UNIT) tablet 1,000 Units  1,000 Units Oral Daily Mansy, Jan A, MD   1,000 Units at 03/24/22 1040    cinacalcet (SENSIPAR) tablet 30 mg  30 mg Oral Q breakfast Mansy, Jan A, MD   30 mg at 03/24/22 0848   diphenoxylate-atropine (LOMOTIL) 2.5-0.025 MG per tablet 1 tablet  1 tablet Oral QID PRN Mansy, Jan A, MD       docusate sodium (COLACE) capsule 200 mg  200 mg Oral BID Wyvonnia Dusky, MD   200 mg at 03/23/22 7829   [START ON 03/25/2022] epoetin alfa (EPOGEN) injection 10,000 Units  10,000 Units Intravenous Q M,W,F-HD Colon Flattery, NP       fluticasone (FLONASE) 50 MCG/ACT nasal spray 1 spray  1 spray Each Nare Daily Mansy, Jan A, MD   1 spray at 03/24/22 1040   gabapentin (NEURONTIN) capsule 100 mg  100 mg Oral BID Mansy, Jan A, MD   100 mg at 03/24/22 1039   glucose chewable tablet 4 g  1 tablet Oral PRN Mansy, Jan A, MD       insulin aspart (novoLOG) injection 0-6 Units  0-6 Units Subcutaneous TID WC Wyvonnia Dusky, MD   1 Units at 03/21/22 1650   insulin aspart protamine- aspart (NOVOLOG MIX 70/30) injection 10 Units  10 Units Subcutaneous BID WC Wyvonnia Dusky, MD   10 Units at 03/24/22 0846   ipratropium-albuterol (DUONEB) 0.5-2.5 (3) MG/3ML nebulizer solution 3 mL  3 mL Nebulization Q4H PRN Nolberto Hanlon, MD       levETIRAcetam (KEPPRA) tablet 1,000 mg  1,000 mg Oral Daily Mansy, Jan A, MD   1,000 mg at 03/24/22 1039   levETIRAcetam (KEPPRA) tablet 250 mg  250 mg Oral Once per day on Mon Wed Fri Mansy, Jan A, MD   250 mg at 03/21/22 1649   lidocaine-prilocaine (EMLA) cream 1 Application  1 Application Topical PRN Mansy, Jan A, MD       losartan (COZAAR) tablet 100 mg  100 mg Oral QHS Mansy, Jan A, MD   100 mg at 03/23/22 2059   magnesium hydroxide (MILK OF MAGNESIA) suspension 30 mL  30 mL Oral Daily PRN Mansy, Jan A, MD   30 mL at 03/23/22 0419   metoCLOPramide (REGLAN) injection 5 mg  5 mg Intravenous Q8H PRN Mansy, Jan A, MD       metoprolol succinate (TOPROL-XL) 24 hr tablet 100 mg  100 mg Oral Q breakfast Mansy, Jan A, MD   100 mg at 03/24/22 [phone]  oxyCODONE (Oxy  IR/ROXICODONE) immediate release tablet 5 mg  5 mg Oral Q6H PRN Mansy, Jan A, MD       pantoprazole (PROTONIX) EC tablet 40 mg  40 mg Oral Daily Mansy, Jan A, MD   40 mg at 03/24/22 1040   polyethylene glycol (MIRALAX / GLYCOLAX) packet 17 g  17 g Oral Daily Wyvonnia Dusky, MD   17 g at 03/23/22 0935   torsemide (DEMADEX) tablet 100 mg  100 mg Oral Q breakfast Mansy, Jan A, MD   100 mg at 03/24/22 2595   traZODone (DESYREL) tablet 25 mg  25 mg Oral QHS PRN Mansy, Jan A, MD   25 mg at 03/23/22 2101     Review of Systems Full ROS  was asked and was negative except for the information on the HPI  Physical Exam Blood pressure (!) 119/32, pulse 68, temperature 97.9 F (36.6 C), temperature source Oral, resp. rate 16, height '5\' 9"'$  (1.753 m), weight 122.5 kg, SpO2 99 %. CONSTITUTIONAL: NAD alert a bit somnolent.  Chaperone is  present EYES: Pupils are equal, round, and reactive to light, Sclera are non-icteric. EARS, NOSE, MOUTH AND THROAT: The oropharynx is clear. The oral mucosa is pink and moist. Hearing is intact to voice. LYMPH NODES:  Lymph nodes in the neck are normal. BREAST: There is evidence of a lumpectomy scar measuring 5 cm upper outer quadrant.  There is a significant enlargement of the left breast with induration and some erythema.  There are skin erythema is not obvious.  There is no evidence of necrotizing infection.  There is no evidence of lymphedema.  Cannot rule out an infected infected seroma vs abscess RESPIRATORY:  Lungs are clear but decrease at bases. She does have a port on the right side  There is normal respiratory effort, with equal breath sounds bilaterally, and without pathologic use of accessory muscles. CARDIOVASCULAR: Heart is regular without murmurs, gallops, or rubs. GI: The abdomen is  soft, nontender, and nondistended. There are no palpable masses. There is no hepatosplenomegaly. There are normal bowel sounds in all quadrants. GU: Rectal deferred.    MUSCULOSKELETAL: Normal muscle strength and tone. No cyanosis or edema.   SKIN: Turgor is good and there are no pathologic skin lesions or ulcers. NEUROLOGIC: Motor and sensation is grossly normal. Cranial nerves are grossly intact. PSYCH:  Oriented to person, place and time. Affect is normal.  Data Reviewed  I have personally reviewed the patient's imaging, laboratory findings and medical records.    Assessment/Plan   71 year old female status post left lumpectomy and sentinel lymph node biopsy presents with sepsis and blood cultures showing a Staph capitis.  She does have definitely an abnormal left breast exam and differentials will include abscess versus infected seroma versus infected hematoma.  Seems to be cystic and likely is infected seroma.  I would like to interrogate the left breast with a dedicated CT scan.  On exam she does not seem to have a necrotizing infection that would warrant emergent intervention at this time. This will likely require IR to place drain and re culture.  Continue A/bs for now. She needs to be placed on heparin drip and be npo after MN We will continue to follow I spent  75 minutes in this encounter including personally reviewing imaging studies, reviewing operative reports and records,  counseling her,  coordination her care, placing orders and performing appropriate documentation.  Caroleen Hamman, MD FACS General Surgeon 03/24/2022, 2:55 PM

## 2022-03-24 NOTE — Consult Note (Signed)
Chief Complaint: Patient was seen in consultation today for left breast fluid collection at the request of Dr. Dahlia Byes  Referring Physician(s): Dr. Dahlia Byes  Supervising Physician: Aletta Edouard  Patient Status: [facility] - In-pt  History of Present Illness: Cynthia Dean is a 71 y.o. female with pertinent PMHx of recent left breast lumpectomy with sentinel lymph node biopsy by [facility] 3 weeks ago. She presented to ED with leg swelling and fatigue. CTA done revealed small bilateral PE without right heart strain and bilateral lower extremity venous US without no evidence of DVT. Incidentally the imaging showed left breast fluid collection concerning for seroma versus abscess given recent surgery. Patient also had + blood cultures upon admission with leukocytosis and is being treated for bacteremia. US breast done today that again showed fluid collection that may represent seroma versus abscess. General surgery has seen the patient and request received for IR aspiration with possible drain placement if infected.   The patient denies any current chest pain or shortness of breath. She does not complain of left breast pain, however there is drainage and a dressing has been applied. The patient denies any history of sleep apnea or chronic oxygen use. She has no known complications to sedation.   Past Medical History:  Diagnosis Date   Acute on chronic respiratory failure with hypoxia ([place]) 07/30/2019   Acute respiratory failure with hypoxia ([place]) 12/31/2018   Carotid arterial disease ([place])    a. 02/2018 < bilat ICA stenosis.   COPD (chronic obstructive pulmonary disease) (H[facility])    Coronary artery disease    a. 11/2015 MV: EF 57%, no ischemia/infarct.   Cryptogenic stroke ([place])    a. 10/2017 s/p implantable loop recorder (no Afib to date).   Diabetes mellitus without complication ([place])    Diarrhea    ESRD on dialysis [facility])    GI bleed    a. 01/2019 EGD/Colonoscopy: Mild gastritis. Colitis.   Heart  murmur    a. 12/2018 Echo: EF 55-60%, impaired relaxation. Mod dil LA. Mildly dil RA. Mild Ca2+ of AoV.   Hematemesis 06/27/2017   History of 2019 novel coronavirus disease (COVID-19) 07/30/2019   Hyperkalemia    Hyperlipidemia    Hypertension    Intractable nausea and vomiting 08/08/2015   Nausea & vomiting 05/30/2018   Nausea and vomiting 05/29/2018   Pneumonia due to COVID-19 virus     Past Surgical History:  Procedure Laterality Date   A/V FISTULAGRAM Left 12/20/2016   Procedure: A/V Fistulagram;  Surgeon: Katha Cabal, MD;  Location: [place] [facility];  Service: Cardiovascular;  Laterality: Left;   A/V SHUNT INTERVENTION N/A 12/20/2016   Procedure: A/V Shunt Intervention;  Surgeon: Katha Cabal, MD;  Location: [place] [facility];  Service: Cardiovascular;  Laterality: N/A;   A/V SHUNTOGRAM Left 09/11/2017   Procedure: A/V SHUNTOGRAM;  Surgeon: Algernon Huxley, MD;  Location: [place] [facility];  Service: Cardiovascular;  Laterality: Left;   A/V SHUNTOGRAM Left 11/21/2019   Procedure: A/V SHUNTOGRAM;  Surgeon: Algernon Huxley, MD;  Location: [place] [facility];  Service: Cardiovascular;  Laterality: Left;   BREAST BIOPSY Bilateral 07/19/2000   neg   BREAST BIOPSY Left 11/03/2021   [place] bx mass at 3:00, venus marker, axilla bx-hyrdo marker, path pending   COLONOSCOPY WITH PROPOFOL N/A 02/16/2019   Procedure: COLONOSCOPY WITH PROPOFOL;  Surgeon: Toledo, Benay Pike, MD;  Location: [facility];  Service: Gastroenterology;  Laterality: N/A;   ESOPHAGOGASTRODUODENOSCOPY (EGD) WITH PROPOFOL N/A  02/16/2019   Procedure: ESOPHAGOGASTRODUODENOSCOPY (EGD) WITH PROPOFOL;  Surgeon: Toledo, Benay Pike, MD;  Location: [facility] ENDOSCOPY;  Service: Gastroenterology;  Laterality: N/A;   LOOP RECORDER INSERTION N/A 11/16/2017   Procedure: LOOP RECORDER INSERTION;  Surgeon: Deboraha Sprang, MD;  Location: [place] [facility];  Service: Cardiovascular;  Laterality: N/A;   PERIPHERAL VASCULAR  CATHETERIZATION Left 02/02/2015   Procedure: A/V Shuntogram/Fistulagram;  Surgeon: Algernon Huxley, MD;  Location: [place] [facility];  Service: Cardiovascular;  Laterality: Left;   PERIPHERAL VASCULAR CATHETERIZATION Left 02/02/2015   Procedure: A/V Shunt Intervention;  Surgeon: Algernon Huxley, MD;  Location: [place] [facility];  Service: Cardiovascular;  Laterality: Left;   PERIPHERAL VASCULAR CATHETERIZATION Left 03/09/2015   Procedure: A/V Shuntogram/Fistulagram;  Surgeon: Algernon Huxley, MD;  Location: [place] [facility];  Service: Cardiovascular;  Laterality: Left;   PERIPHERAL VASCULAR CATHETERIZATION N/A 03/09/2015   Procedure: A/V Shunt Intervention;  Surgeon: Algernon Huxley, MD;  Location: [place] [facility];  Service: Cardiovascular;  Laterality: N/A;   PERIPHERAL VASCULAR CATHETERIZATION Left 04/17/2015   Procedure: Upper Extremity Angiography;  Surgeon: Algernon Huxley, MD;  Location: [place] [facility];  Service: Cardiovascular;  Laterality: Left;   PERIPHERAL VASCULAR CATHETERIZATION  04/17/2015   Procedure: Upper Extremity Intervention;  Surgeon: Algernon Huxley, MD;  Location: [place] [facility];  Service: Cardiovascular;;   PERIPHERAL VASCULAR CATHETERIZATION N/A 08/10/2015   Procedure: A/V Shuntogram/Fistulagram;  Surgeon: Algernon Huxley, MD;  Location: [place] [facility];  Service: Cardiovascular;  Laterality: N/A;   PERIPHERAL VASCULAR CATHETERIZATION N/A 08/10/2015   Procedure: A/V Shunt Intervention;  Surgeon: Algernon Huxley, MD;  Location: [place] [facility];  Service: Cardiovascular;  Laterality: N/A;   PERIPHERAL VASCULAR CATHETERIZATION Left 04/11/2016   Procedure: A/V Shuntogram/Fistulagram;  Surgeon: Algernon Huxley, MD;  Location: [place] [facility];  Service: Cardiovascular;  Laterality: Left;   TEE WITHOUT CARDIOVERSION N/A 11/16/2017   Procedure: TRANSESOPHAGEAL ECHOCARDIOGRAM (TEE);  Surgeon: Minna Merritts, MD;  Location: [facility];  Service: Cardiovascular;  Laterality:  N/A;    Allergies: Oxycodone, Oxycodone-acetaminophen, Wound dressing adhesive, Hydrocodone, Tape, and Tapentadol  Medications: Prior to Admission medications   Medication Sig Start Date End Date Taking? Authorizing Provider  acetaminophen (TYLENOL) 500 MG tablet Take 500 mg by mouth every 6 (six) hours as needed.   Yes [provider]  aspirin EC 81 MG tablet Take 81 mg by mouth daily. Swallow whole.   Yes [provider]  atorvastatin (LIPITOR) 80 MG tablet Take 80 mg by mouth every evening.    Yes [provider]  calcium acetate (PHOSLO) 667 MG capsule Take 3 capsules (2,001 mg total) by mouth 3 (three) times daily with meals. 08/11/19  Yes Nicole Kindred A, DO  cholecalciferol (VITAMIN D) 25 MCG tablet Take 1 tablet (1,000 Units total) by mouth daily. 08/11/19  Yes Ezekiel Slocumb, DO  cinacalcet (SENSIPAR) 30 MG tablet Take 30 mg by mouth daily at 6 (six) AM. Take with largest meal 11/10/19  Yes [provider]  clopidogrel (PLAVIX) 75 MG tablet TAKE ONE TABLET BY MOUTH ONCE DAILY Patient taking differently: Take 75 mg by mouth daily. 06/14/16  Yes Stegmayer, Joelene Millin A, PA-C  fluticasone (FLONASE) 50 MCG/ACT nasal spray Place 1 spray into both nostrils daily.   Yes [provider]  gabapentin (NEURONTIN) 100 MG capsule Take 1 capsule (100 mg total) by mouth at bedtime. Patient taking differently: Take 100 mg by  mouth 2 (two) times daily. 10/25/19  Yes Patrecia Pour, MD  glucose 4 GM chewable tablet Chew 1 tablet by mouth as needed for low blood sugar.   Yes [provider]  Ipratropium-Albuterol (COMBIVENT) 20-100 MCG/ACT AERS respimat Inhale 1 puff into the lungs every 6 (six) hours. Patient taking differently: Inhale 1 puff into the lungs 2 (two) times daily. 08/11/19  Yes Nicole Kindred A, DO  levETIRAcetam (KEPPRA) 1000 MG tablet Take 1 tablet (1,000 mg total) by mouth daily. 03/15/18  Yes Vaughan Basta, MD  levETIRAcetam  (KEPPRA) 250 MG tablet Take 250 mg by mouth 3 (three) times a week. 01/16/20  Yes [provider]  lidocaine-prilocaine (EMLA) cream Apply 1 application topically as needed (for port access).    Yes [provider]  losartan (COZAAR) 100 MG tablet Take 100 mg by mouth at bedtime. 02/07/22  Yes [provider]  metoprolol succinate (TOPROL-XL) 100 MG 24 hr tablet Take 100 mg by mouth daily. Take with or immediately following a meal.    Yes [provider]  NOVOLIN 70/30 (70-30) 100 UNIT/ML injection SMARTSIG:28 Unit(s) SUB-Q Twice Daily 10/12/21  Yes [provider]  omeprazole (PRILOSEC) 20 MG capsule Take 20 mg by mouth 2 (two) times daily.    Yes [provider]  torsemide (DEMADEX) 100 MG tablet Take 100 mg by mouth daily. 10/18/21  Yes [provider]  diphenoxylate-atropine (LOMOTIL) 2.5-0.025 MG tablet SMARTSIG:1 Tablet(s) By Mouth Every 12 Hours PRN 01/06/22   [provider]  loperamide (IMODIUM) 2 MG capsule Take 2 mg by mouth 4 (four) times daily as needed. 03/07/22   [provider]  oxyCODONE (OXY IR/ROXICODONE) 5 MG immediate release tablet Take 5 mg by mouth every 6 (six) hours as needed. 03/04/22   [provider]     Family History  Problem Relation Age of Onset   Breast cancer Father 72   Stroke Mother    Heart attack Mother    Heart Problems Sister    Breast cancer Cousin 48       1 st cousin. Maternal     Social History   Socioeconomic History   Marital status: Single    Spouse name: Not on file   Number of children: 5   Years of education: Not on file   Highest education level: Not on file  Occupational History   Occupation: Disability   Tobacco Use   Smoking status: Former    Packs/day: 0.00    Types: Cigarettes   Smokeless tobacco: Never  Vaping Use   Vaping Use: Never used  Substance and Sexual Activity   Alcohol use: No   Drug use: No   Sexual activity: Not Currently   Other Topics Concern   Not on file  Social History Narrative   Live with Fiance   Social Determinants of Health   Financial Resource Strain: Low Risk  (11/12/2017)   Overall Financial Resource Strain (CARDIA)    Difficulty of Paying Living Expenses: Not hard at all  Food Insecurity: No Food Insecurity (11/12/2017)   Hunger Vital Sign    Worried About Running Out of Food in the Last Year: Never true    Ran Out of Food in the Last Year: Never true  Transportation Needs: No Transportation Needs (11/12/2017)   PRAPARE - Hydrologist (Medical): No    Lack of Transportation (Non-Medical): No  Physical Activity: Insufficiently Active (11/12/2017)   Exercise Vital Sign  Days of Exercise per Week: 5 days    Minutes of Exercise per Session: 20 min  Stress: No Stress Concern Present (11/12/2017)   [place]    Feeling of Stress : Not at all  Social Connections: Moderately Integrated (11/12/2017)   Social Connection and Isolation Panel [NHANES]    Frequency of Communication with Friends and Family: Twice a week    Frequency of Social Gatherings with Friends and Family: Twice a week    Attends Religious Services: More than 4 times per year    Active Member of [employer] or Organizations: Yes    Attends Music therapist: More than 4 times per year    Marital Status: Never married    Review of Systems: A 12 point ROS discussed and pertinent positives are indicated in the HPI above.  All other systems are negative.  Review of Systems  Vital Signs: BP (!) 143/48 (BP Location: Right Arm)   Pulse 67   Temp 98.7 F (37.1 C) (Oral)   Resp 16   Ht '5\' 9"'$  (1.753 m)   Wt 270 lb 1 oz (122.5 kg)   SpO2 100%   BMI 39.88 kg/m   Physical Exam Constitutional:      General: She is not in acute distress.    Appearance: Normal appearance.  HENT:     Head: Normocephalic and atraumatic.   Cardiovascular:     Rate and Rhythm: Normal rate and regular rhythm.  Pulmonary:     Effort: Pulmonary effort is normal. No respiratory distress.     Breath sounds: Normal breath sounds.  Skin:    Comments: Left breast swelling with dressing in place upper lateral region with drainage on dressing, surgical incision below dressing without redness or drainage. Some induration is noted and minimal tenderness to touch.  Neurological:     Mental Status: She is alert.     Comments: Alert and oriented x 1 her date of birth, unable to state year or hospital she is currently in.    Imaging: US BREAST LTD UNI LEFT INC AXILLA  Result Date: 03/24/2022 CLINICAL DATA:  Mass of left breast, assess for abscess. Patient is status post left breast lumpectomy August 11th 2023. EXAM: ULTRASOUND OF THE LEFT BREAST COMPARISON:  Prior films FINDINGS: Targeted ultrasound is performed, showing complicated cystic lesion at the palpable area left breast 1-3 o'clock 12 cm from nipple measuring 14.5 x 6.3 x 9.2 cm. This may represent postsurgical seroma but abscess is not excluded based on ultrasound. IMPRESSION: Benign findings. Large mass at the left breast 1-3 o'clock 12 cm from nipple. This may represent postsurgical seroma but abscess is not excluded based on ultrasound. RECOMMENDATION: Management on clinical basis. I have discussed the findings and recommendations with the patient. If applicable, a reminder letter will be sent to the patient regarding the next appointment. BI-RADS CATEGORY  2: Benign. Electronically Signed   By: Abelardo Diesel M.D.   On: 03/24/2022 10:15   ECHOCARDIOGRAM COMPLETE  Result Date: 03/23/2022    ECHOCARDIOGRAM REPORT   Patient Name:   Cynthia Dean Date of Exam: 03/22/2022 Medical Rec #:  [id number]              Height:       69.0 in Accession #:    [id number]             Weight:       256.4 lb Date of Birth:  1950-10-14               BSA:          2.296 m Patient Age:    58 years                BP:           131/80 mmHg Patient Gender: F                      HR:           67 bpm. Exam Location:  [facility] Procedure: 2D Echo, Cardiac Doppler and Color Doppler Indications:     Pulmonary Embolus I26.09  History:         Patient has prior history of Echocardiogram examinations, most                  recent 01/01/2019. COPD, Signs/Symptoms:Murmur; Risk                  Factors:Diabetes and Hypertension.  Sonographer:     Sherrie Sport Referring Phys:  [id number] JAN A MANSY Diagnosing Phys: Yolonda Kida MD  Sonographer Comments: Suboptimal apical window. IMPRESSIONS  1. TDS.  2. Left ventricular ejection fraction, by estimation, is 50 to 55%. The left ventricle has normal function. The left ventricle has no regional wall motion abnormalities. The left ventricular internal cavity size was mildly dilated. Left ventricular diastolic parameters are consistent with Grade II diastolic dysfunction (pseudonormalization).  3. Right ventricular systolic function is normal. The right ventricular size is normal.  4. Left atrial size was mildly dilated.  5. Right atrial size was mildly dilated.  6. The mitral valve is normal in structure. Trivial mitral valve regurgitation.  7. The aortic valve is normal in structure. Aortic valve regurgitation is not visualized. FINDINGS  Left Ventricle: Left ventricular ejection fraction, by estimation, is 50 to 55%. The left ventricle has normal function. The left ventricle has no regional wall motion abnormalities. The left ventricular internal cavity size was mildly dilated. There is  no left ventricular hypertrophy. Left ventricular diastolic parameters are consistent with Grade II diastolic dysfunction (pseudonormalization). Right Ventricle: The right ventricular size is normal. No increase in right ventricular wall thickness. Right ventricular systolic function is normal. Left Atrium: Left atrial size was mildly dilated. Right Atrium: Right atrial size was mildly dilated.  Pericardium: There is no evidence of pericardial effusion. Mitral Valve: The mitral valve is normal in structure. Trivial mitral valve regurgitation. Tricuspid Valve: The tricuspid valve is normal in structure. Tricuspid valve regurgitation is mild. Aortic Valve: The aortic valve is normal in structure. Aortic valve regurgitation is not visualized. Aortic valve mean gradient measures 4.7 mmHg. Aortic valve peak gradient measures 9.3 mmHg. Aortic valve area, by VTI measures 1.79 cm. Pulmonic Valve: The pulmonic valve was normal in structure. Pulmonic valve regurgitation is not visualized. Aorta: The ascending aorta was not well visualized. IAS/Shunts: No atrial level shunt detected by color flow Doppler. Additional Comments: TDS.  LEFT VENTRICLE PLAX 2D LVIDd:         5.00 cm   Diastology LVIDs:         3.60 cm   LV e' medial:    5.77 cm/s LV PW:         1.10 cm   LV E/e' medial:  24.1 LV IVS:        1.10 cm   LV e' lateral:   6.96 cm/s LVOT diam:  2.00 cm   LV E/e' lateral: 20.0 LV SV:         56 LV SV Index:   24 LVOT Area:     3.14 cm  RIGHT VENTRICLE RV Basal diam:  4.20 cm RV S prime:     13.90 cm/s TAPSE (M-mode): 3.2 cm LEFT ATRIUM              Index        RIGHT ATRIUM           Index LA diam:        4.40 cm  1.92 cm/m   RA Area:     19.70 cm LA Vol (A2C):   117.0 ml 50.96 ml/m  RA Volume:   57.10 ml  24.87 ml/m LA Vol (A4C):   68.9 ml  30.01 ml/m LA Biplane Vol: 97.9 ml  42.64 ml/m  AORTIC VALVE AV Area (Vmax):    1.58 cm AV Area (Vmean):   1.73 cm AV Area (VTI):     1.79 cm AV Vmax:           152.67 cm/s AV Vmean:          98.300 cm/s AV VTI:            0.311 m AV Peak Grad:      9.3 mmHg AV Mean Grad:      4.7 mmHg LVOT Vmax:         77.00 cm/s LVOT Vmean:        54.200 cm/s LVOT VTI:          0.177 m LVOT/AV VTI ratio: 0.57  AORTA Ao Root diam: 2.47 cm MITRAL VALVE                TRICUSPID VALVE MV Area (PHT): 2.63 cm     TR Peak grad:   35.3 mmHg MV Decel Time: 288 msec     TR Vmax:         297.00 cm/s MV E velocity: 139.00 cm/s MV A velocity: 92.50 cm/s   SHUNTS MV E/A ratio:  1.50         Systemic VTI:  0.18 m                             Systemic Diam: 2.00 cm Yolonda Kida MD Electronically signed by Yolonda Kida MD Signature Date/Time: 03/23/2022/8:00:35 AM    Final    US Venous Img Lower Unilateral Left (DVT)  Result Date: 03/20/2022 CLINICAL DATA:  71 year old female with history of pulmonary embolism. EXAM: LEFT LOWER EXTREMITY VENOUS DOPPLER ULTRASOUND TECHNIQUE: Gray-scale sonography with compression, as well as color and duplex ultrasound, were performed to evaluate the deep venous system(s) from the level of the common femoral vein through the popliteal and proximal calf veins. COMPARISON:  No priors. FINDINGS: VENOUS Normal compressibility of the common femoral, superficial femoral, and popliteal veins, as well as the visualized calf veins. Visualized portions of profunda femoral vein and great saphenous vein unremarkable. No filling defects to suggest DVT on grayscale or color Doppler imaging. Doppler waveforms show normal direction of venous flow, normal respiratory plasticity and response to augmentation. Limited views of the contralateral common femoral vein are unremarkable. OTHER None. Limitations: none IMPRESSION: Negative. Electronically Signed   By: Vinnie Langton M.D.   On: 03/20/2022 05:04   US Venous Img Lower Unilateral Right  Result Date: 03/20/2022 CLINICAL DATA:  Leg  swelling and calf pain EXAM: RIGHT LOWER EXTREMITY VENOUS DOPPLER ULTRASOUND TECHNIQUE: Gray-scale sonography with compression, as well as color and duplex ultrasound, were performed to evaluate the deep venous system(s) from the level of the common femoral vein through the popliteal and proximal calf veins. COMPARISON:  None Available. FINDINGS: VENOUS Normal compressibility of the common femoral, superficial femoral, and popliteal veins, as well as the visualized calf veins. Visualized  portions of profunda femoral vein and great saphenous vein unremarkable. No filling defects to suggest DVT on grayscale or color Doppler imaging. Doppler waveforms show normal direction of venous flow, normal respiratory plasticity and response to augmentation. Limited views of the contralateral common femoral vein are unremarkable. OTHER None. Limitations: none IMPRESSION: Negative. Electronically Signed   By: Julian Hy M.D.   On: 03/20/2022 00:19   CT Angio Chest PE W and/or Wo Contrast  Result Date: 03/20/2022 CLINICAL DATA:  Pulmonary embolism (PE) suspected, high prob EXAM: CT ANGIOGRAPHY CHEST WITH CONTRAST TECHNIQUE: Multidetector CT imaging of the chest was performed using the standard protocol during bolus administration of intravenous contrast. Multiplanar CT image reconstructions and MIPs were obtained to evaluate the vascular anatomy. RADIATION DOSE REDUCTION: This exam was performed according to the departmental dose-optimization program which includes automated exposure control, adjustment of the mA and/or kV according to patient size and/or use of iterative reconstruction technique. CONTRAST:  168m OMNIPAQUE IOHEXOL 350 MG/ML SOLN COMPARISON:  12/18/2013 FINDINGS: Cardiovascular: Filling defects within posterior right lower lobe pulmonary arterial branches compatible with pulmonary emboli. Small pulmonary emboli also seen in the left lower lobe and right upper lobe. Mild cardiomegaly. Diffuse coronary artery disease. Aortic atherosclerosis. No aneurysm. Mediastinum/Nodes: No mediastinal, hilar, or axillary adenopathy. Trachea and esophagus are unremarkable. Thyroid unremarkable. Lungs/Pleura: Bibasilar opacities, likely atelectasis. No effusions. Upper Abdomen: No acute findings Musculoskeletal: Large soft tissue mass partially visualized in the left breast with gas throughout the adjacent soft tissues. Question recent surgical procedure. No acute bony abnormality. Review of the MIP  images confirms the above findings. IMPRESSION: Small pulmonary emboli within both lower lobes and the right upper lobe. No evidence of right heart strain. Cardiomegaly, coronary artery disease. Bibasilar atelectasis. Large masslike area in the left breast with surrounding soft tissue gas. Question recent surgery in the left breast. Recommend correlation with history and mammography. Aortic Atherosclerosis (ICD10-I70.0). Electronically Signed   By: KRolm BaptiseM.D.   On: 03/20/2022 00:05   DG Chest Port 1 View  Result Date: 03/19/2022 CLINICAL DATA:  Possible sepsis EXAM: PORTABLE CHEST 1 VIEW COMPARISON:  01/26/2022 FINDINGS: Cardiac shadow is enlarged in size but stable. Aortic calcifications are seen. Loop recorder and right chest wall port are again seen. Increasing central vascular congestion is noted without significant edema. No focal infiltrate or effusion is noted. No bony abnormality is seen. IMPRESSION: Increasing vascular congestion without significant edema. Electronically Signed   By: MInez CatalinaM.D.   On: 03/19/2022 22:34    Labs:  CBC: Recent Labs    03/20/22 0448 03/20/22 1901 03/21/22 0655 03/21/22 1847 03/22/22 0156 03/23/22 0647  WBC 14.4*  --  9.6  --  9.7 10.4  HGB 6.6*   < > 7.1* 7.5* 8.0* 7.5*  HCT 21.8*   < > 22.7* 23.8* 25.3* 24.3*  PLT 163  --  168  --  210 235   < > = values in this interval not displayed.    COAGS: Recent Labs    03/19/22 2127  INR 1.2  APTT 32  BMP: Recent Labs    03/20/22 0448 03/21/22 0655 03/22/22 0156 03/23/22 0647  NA 136 135 136 137  K 3.8 4.6 3.6 4.4  CL 109 101 98 101  CO2 21* '22 26 26  '$ GLUCOSE 105* 84 91 101*  BUN 47* 60* 32* 38*  CALCIUM 7.9* 8.7* 8.7* 9.5  CREATININE 9.97* 12.47* 7.57* 9.31*  GFRNONAA 4* 3* 5* 4*    LIVER FUNCTION TESTS: Recent Labs    01/26/22 0930 03/19/22 2127  BILITOT 0.7 1.2  AST 13* 15  ALT 12 7  ALKPHOS 80 89  PROT 7.1 7.2  ALBUMIN 3.0* 3.4*    Assessment and  Plan: This is a 71 year old female with pertinent PMHx of recent left breast lumpectomy with sentinel lymph node biopsy by [facility] 3 weeks ago. She presented to ED with leg swelling and fatigue. CTA done revealed small bilateral PE without right heart strain and bilateral lower extremity venous US without no evidence of DVT. Incidentally the imaging showed left breast fluid collection concerning for seroma versus abscess given recent surgery. Patient also had + blood cultures upon admission with leukocytosis and is being treated for bacteremia. US breast done today that again showed fluid collection that may represent seroma versus abscess. General surgery has seen the patient and request received for IR aspiration with possible drain placement if infected.   The patient will be NPO after midnight, patient is on Eliquis and heparin infusion for pulmonary embolism, imaging, labs and vitals have been reviewed, wbc now wnl, afebrile and receiving Ancef. Repeat blood cultures with no growth.   Risks and benefits discussed with the patient and her fiance today including bleeding, infection, damage to adjacent structures, aspiration versus drainage catheter placement if signs of infection and need for follow up with [facility] surgery after discharge.   All of the patient's questions were answered, patient is agreeable to proceed. Consent signed and in the ultrasound department.  Thank you for this interesting consult.  I greatly enjoyed meeting Brittony Christmas Faraci and look forward to participating in their care.  A copy of this report was sent to the requesting provider on this date.  Electronically Signed: Hedy Jacob, PA-C 03/24/2022, 4:41 PM   I spent a total of 20 Minutes  in face to face in clinical consultation, greater than 50% of which was counseling/coordinating care for left breast fluid collection.

## 2022-03-24 NOTE — Progress Notes (Signed)
PT Cancellation Note  Patient Details Name: Cynthia Dean MRN: [id number] DOB: 01/30/1951   Cancelled Treatment:     PT attempt. Upon entering room transport arrived to take pt to CT. PT will continue to follow and progress as able per current POC.    Willette Pa 03/24/2022, 3:47 PM

## 2022-03-25 ENCOUNTER — Inpatient Hospital Stay: Payer: Medicare Other | Admitting: Radiology

## 2022-03-25 DIAGNOSIS — R7881 Bacteremia: Secondary | ICD-10-CM

## 2022-03-25 DIAGNOSIS — A419 Sepsis, unspecified organism: Secondary | ICD-10-CM | POA: Diagnosis not present

## 2022-03-25 DIAGNOSIS — N6489 Other specified disorders of breast: Secondary | ICD-10-CM

## 2022-03-25 DIAGNOSIS — I2699 Other pulmonary embolism without acute cor pulmonale: Secondary | ICD-10-CM | POA: Diagnosis not present

## 2022-03-25 DIAGNOSIS — B957 Other staphylococcus as the cause of diseases classified elsewhere: Secondary | ICD-10-CM

## 2022-03-25 DIAGNOSIS — D638 Anemia in other chronic diseases classified elsewhere: Secondary | ICD-10-CM | POA: Diagnosis not present

## 2022-03-25 DIAGNOSIS — E785 Hyperlipidemia, unspecified: Secondary | ICD-10-CM | POA: Diagnosis not present

## 2022-03-25 DIAGNOSIS — N611 Abscess of the breast and nipple: Secondary | ICD-10-CM | POA: Diagnosis not present

## 2022-03-25 HISTORY — PX: IR US GUIDE BX ASP/DRAIN: IMG2392

## 2022-03-25 LAB — CBC
HCT: 23.3 % — ABNORMAL LOW (ref 36.0–46.0)
Hemoglobin: 7.2 g/dL — ABNORMAL LOW (ref 12.0–15.0)
MCH: 27.7 pg (ref 26.0–34.0)
MCHC: 30.9 g/dL (ref 30.0–36.0)
MCV: 89.6 fL (ref 80.0–100.0)
Platelets: 255 10*3/uL (ref 150–400)
RBC: 2.6 MIL/uL — ABNORMAL LOW (ref 3.87–5.11)
RDW: 16.3 % — ABNORMAL HIGH (ref 11.5–15.5)
WBC: 10.9 10*3/uL — ABNORMAL HIGH (ref 4.0–10.5)
nRBC: 0 % (ref 0.0–0.2)

## 2022-03-25 LAB — COMPREHENSIVE METABOLIC PANEL
ALT: 8 U/L (ref 0–44)
AST: 14 U/L — ABNORMAL LOW (ref 15–41)
Albumin: 2.6 g/dL — ABNORMAL LOW (ref 3.5–5.0)
Alkaline Phosphatase: 73 U/L (ref 38–126)
Anion gap: 11 (ref 5–15)
BUN: 27 mg/dL — ABNORMAL HIGH (ref 8–23)
CO2: 25 mmol/L (ref 22–32)
Calcium: 9.4 mg/dL (ref 8.9–10.3)
Chloride: 101 mmol/L (ref 98–111)
Creatinine, Ser: 8.43 mg/dL — ABNORMAL HIGH (ref 0.44–1.00)
GFR, Estimated: 5 mL/min — ABNORMAL LOW (ref >60)
Glucose, Bld: 119 mg/dL — ABNORMAL HIGH (ref 70–99)
Potassium: 4.2 mmol/L (ref 3.5–5.1)
Sodium: 137 mmol/L (ref 135–145)
Total Bilirubin: 0.7 mg/dL (ref 0.3–1.2)
Total Protein: 6.1 g/dL — ABNORMAL LOW (ref 6.5–8.1)

## 2022-03-25 LAB — APTT
aPTT: 89 seconds — ABNORMAL HIGH (ref 24–36)
aPTT: 80 seconds — ABNORMAL HIGH (ref 24–36)

## 2022-03-25 LAB — GLUCOSE, CAPILLARY
Glucose-Capillary: 129 mg/dL — ABNORMAL HIGH (ref 70–99)
Glucose-Capillary: 157 mg/dL — ABNORMAL HIGH (ref 70–99)
Glucose-Capillary: 102 mg/dL — ABNORMAL HIGH (ref 70–99)

## 2022-03-25 LAB — HEPARIN LEVEL (UNFRACTIONATED): Heparin Unfractionated: >1.1 IU/mL — ABNORMAL HIGH (ref 0.30–0.70)

## 2022-03-25 LAB — PREPARE RBC (CROSSMATCH)

## 2022-03-25 MED ORDER — PENTAFLUOROPROP-TETRAFLUOROETH EX AERO
INHALATION_SPRAY | CUTANEOUS | Status: AC
  Filled 2022-03-25: qty 30

## 2022-03-25 MED ORDER — MIDAZOLAM HCL 2 MG/2ML IJ SOLN
INTRAMUSCULAR | Status: AC | PRN
  Administered 2022-03-25: 1 mg via INTRAVENOUS

## 2022-03-25 MED ORDER — FENTANYL CITRATE (PF) 100 MCG/2ML IJ SOLN
INTRAMUSCULAR | Status: AC
  Filled 2022-03-25: qty 2

## 2022-03-25 MED ORDER — MIDAZOLAM HCL 2 MG/2ML IJ SOLN
INTRAMUSCULAR | Status: AC
  Filled 2022-03-25: qty 2

## 2022-03-25 MED ORDER — EPOETIN ALFA 10000 UNIT/ML IJ SOLN
INTRAMUSCULAR | Status: AC
  Filled 2022-03-25: qty 1

## 2022-03-25 MED ORDER — SODIUM CHLORIDE 0.9% IV SOLUTION: Freq: Once | INTRAVENOUS | Status: DC

## 2022-03-25 MED ORDER — LIDOCAINE HCL 1 % IJ SOLN
INTRAMUSCULAR | Status: AC
  Filled 2022-03-25: qty 20

## 2022-03-25 NOTE — Progress Notes (Signed)
Date of Admission:  03/19/2022    ID: Cynthia Dean is a 71 y.o. 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)   ESRD on hemodialysis (HCC)   Dyslipidemia   Diabetic neuropathy ([place])   Sepsis due to cellulitis (HCC)   Urinary tract infection   Anemia of chronic disease   Left breast abscess   Seroma of breast  Had aspiration of left breast by IR today- 135 ml evacuated- too thick as it was  hematoma  Sent for culture Went for Hemodialysis  Subjective: Ate 30 min ago And then having  abdominal pain and vomiting  Medications:   sodium chloride   Intravenous Once   atorvastatin  80 mg Oral QPM   calcium acetate  2,001 mg Oral TID WC   Chlorhexidine Gluconate Cloth  6 each Topical Q0600   vitamin D3  1,000 Units Oral Daily   cinacalcet  30 mg Oral Q breakfast   docusate sodium  200 mg Oral BID   epoetin (EPOGEN/PROCRIT) injection  10,000 Units Intravenous Q M,W,F-HD   fluticasone  1 spray Each Nare Daily   gabapentin  100 mg Oral BID   insulin aspart  0-6 Units Subcutaneous TID WC   insulin aspart protamine- aspart  10 Units Subcutaneous BID WC   levETIRAcetam  1,000 mg Oral Daily   levETIRAcetam  250 mg Oral Once per day on Mon Wed Fri   losartan  100 mg Oral QHS   metoprolol succinate  100 mg Oral Q breakfast   midazolam       pantoprazole  40 mg Oral Daily   polyethylene glycol  17 g Oral Daily   torsemide  100 mg Oral Q breakfast    Objective: Vital signs in last 24 hours: Temp:  [97.9 F (36.6 C)-98.7 F (37.1 C)] 98.3 F (36.8 C) (09/01 0718) Pulse Rate:  [64-70] 64 (09/01 0957) Resp:  [15-20] 16 (09/01 0957) BP: (119-185)/(32-74) 178/74 (09/01 0957) SpO2:  [92 %-100 %] 92 % (09/01 0957) Weight:  [118.5 kg] 118.5 kg (09/01 0342)   PHYSICAL EXAM:  General: vomiting in distress Lungs: did not   examine Heart: s1s2 Abdomen: Soft, non-tender,not distended. Bowel sounds normal. No masses Extremities: atraumatic, no cyanosis. No edema. No clubbing Skin: No rashes or lesions. Or bruising Lymph: Cervical, supraclavicular normal. Neurologic: Grossly non-focal Left breast - did not examine today enlarged , edematous and indurated Left axillary area- serous fluid oozing Lab Results Recent Labs    03/23/22 0647 03/25/22 0523  WBC 10.4 10.9*  HGB 7.5* 7.2*  HCT 24.3* 23.3*  NA 137 137  K 4.4 4.2  CL 101 101  CO2 26 25  BUN 38* 27*  CREATININE 9.31* 8.43*    Microbiology: BC- staph capitis  Studies/Results: CT CHEST W CONTRAST  Result Date: 03/24/2022 CLINICAL DATA:  Breast cancer, status post recent left lumpectomy, cellulitis and seroma, rule out necrotizing infection * Tracking Code: BO * EXAM: CT CHEST WITH CONTRAST TECHNIQUE: Multidetector CT imaging of the chest was performed during intravenous contrast administration. RADIATION DOSE REDUCTION: This exam was performed according to the departmental dose-optimization program which includes automated exposure control, adjustment of the mA and/or kV according to patient size and/or use of iterative reconstruction technique. CONTRAST:  30m OMNIPAQUE IOHEXOL 300 MG/ML  SOLN COMPARISON:  CT chest angiogram, 03/19/2022, CT chest, 12/18/2013  FINDINGS: Cardiovascular: Right chest port catheter. Aortic atherosclerosis. Cardiomegaly. Three-vessel coronary artery calcifications. No pericardial effusion. Mediastinum/Nodes: Numerous prominent mediastinal and bilateral hilar lymph nodes unchanged compared to recent prior examination and very similar to remote prior examination dated 12/18/2013. Largest pretracheal node measures 2.3 x 1.7 cm (series 7, image 56). Thyroid gland, trachea, and esophagus demonstrate no significant findings. Lungs/Pleura: Innumerable small pulmonary nodules throughout the lungs, not changed from recent prior  examination, although the great majority of which appear new compared to more remote prior examination dated 12/18/2013. Example nodule of the right apex measures 0.6 cm (series 3, image 32). Additional index nodule of the anterior left upper lobe measures 0.7 cm (series 3, image 48). Diffuse bilateral bronchial wall thickening and extensive interlobular septal thickening throughout, including non dependent portions of the lungs. Generally bandlike scarring and or atelectasis of the bilateral lung bases. No pleural effusion or pneumothorax. Upper Abdomen: No acute abnormality. Small, benign left adrenal myelolipoma which no further specific follow-up or characterization required (series 2, image 138). Musculoskeletal: Large, partially imaged fluid collection within the left breast containing scattered internal air loculations with extensive adjacent fat stranding and overlying soft tissue thickening, although incompletely imaged due to large body habitus, measures at least 8.5 cm (series 7, image 92). Additional adjacent collection within the left axilla is fully imaged and measures 11.4 x 5.8 cm (series 7, image 42). No acute osseous findings. IMPRESSION: 1. Large, partially imaged fluid collection within the left breast containing scattered internal air loculations with extensive adjacent fat stranding and overlying soft tissue thickening, although incompletely imaged due to large body habitus, measures at least 8.5 cm. Persistent presence of air within this fluid collection is worrisome for gas-forming infection. 2. Additional adjacent collection within the left axilla is fully imaged and measures 11.4 x 5.8 cm without evidence of gas content and of simple fluid attenuation, most consistent with seroma. The presence or absence of infection within either of these fluid collections is not strictly established by CT. 3. Innumerable small pulmonary nodules throughout the lungs, not changed from recent prior  examination, although the great majority of which appear new compared to more remote prior examination dated 12/18/2013. These are worrisome for pulmonary metastatic disease in the setting of breast cancer although potentially infectious or inflammatory. 4. Diffuse bilateral bronchial wall thickening and interlobular septal thickening throughout, possibly reflecting pulmonary edema, although interlobular septal thickening is worrisome as a potential feature of lymphangitic metastatic disease in the setting of breast cancer. 5. Numerous prominent mediastinal and bilateral hilar lymph nodes unchanged compared to recent prior examination and very similar to remote prior examination dated 12/18/2013, most likely benign and reactive. 6. Cardiomegaly and coronary artery disease. Aortic Atherosclerosis (ICD10-I70.0). Electronically Signed   By: Delanna Ahmadi M.D.   On: 03/24/2022 16:48   US BREAST LTD UNI LEFT INC AXILLA  Result Date: 03/24/2022 CLINICAL DATA:  Mass of left breast, assess for abscess. Patient is status post left breast lumpectomy August 11th 2023. EXAM: ULTRASOUND OF THE LEFT BREAST COMPARISON:  Prior films FINDINGS: Targeted ultrasound is performed, showing complicated cystic lesion at the palpable area left breast 1-3 o'clock 12 cm from nipple measuring 14.5 x 6.3 x 9.2 cm. This may represent postsurgical seroma but abscess is not excluded based on ultrasound. IMPRESSION: Benign findings. Large mass at the left breast 1-3 o'clock 12 cm from nipple. This may represent postsurgical seroma but abscess is not excluded based on ultrasound. RECOMMENDATION: Management on clinical basis. I have discussed  the findings and recommendations with the patient. If applicable, a reminder letter will be sent to the patient regarding the next appointment. BI-RADS CATEGORY  2: Benign. Electronically Signed   By: Abelardo Diesel M.D.   On: 03/24/2022 10:15      Assessment/Plan: 71 yr female with ESRD, left breast ca  s/p partial mastectomy and left axillary dissection on 8/11 presenting fever and confusion and weakness  Vomiting with abdominal apin after having dinner- informed nocturnist She is getting zofran  ? 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 Await result On IV cefazolin She needs to wear support bra- partner bringing it from home   Encephalopathy - resolved   B/l small PE- no hypoxia   ESRD on dialysis  Discussed the management with patient, family at bed side and informed nocturnist  of the vomiting  [facility] available by phone for urgent ID issues this long weekend . Please call if needed

## 2022-03-25 NOTE — Progress Notes (Addendum)
[place] for IV heparin Indication: pulmonary embolus  Allergies  Allergen Reactions   Oxycodone Nausea And Vomiting   Oxycodone-Acetaminophen Other (See Comments)   Wound Dressing Adhesive    Hydrocodone     Intolerant more than allergic   Tape Itching    Skin Dermatitis/itching (tape adhesive) Skin Dermatitis/itching (tape adhesive)   Tapentadol Itching    Skin Dermatitis/itching (tape adhesive) Skin Dermatitis/itching (tape adhesive)     Patient Measurements: Height: '5\' 9"'$  (175.3 cm) Weight: 116 kg (255 lb 11.7 oz) IBW/kg (Calculated) : 66.2 Heparin Dosing Weight: 123 kg  Vital Signs: Temp: 98.3 F (36.8 C) (09/01 1358) Temp Source: Oral (09/01 1358) BP: 136/83 (09/01 1401) Pulse Rate: 67 (09/01 1401)  Labs: Recent Labs    03/23/22 0647 03/24/22 1656 03/25/22 0523 03/25/22 1331  HGB 7.5*  --  7.2*  --   HCT 24.3*  --  23.3*  --   PLT 235  --  255  --   APTT  --   --  89* 80*  LABPROT  --  19.6*  --   --   INR  --  1.7*  --   --   HEPARINUNFRC  --   --  >1.10*  --   CREATININE 9.31*  --  8.43*  --      Estimated Creatinine Clearance: 8.3 mL/min (A) (by C-G formula based on SCr of 8.43 mg/dL (H)).   Medications:  Medications Prior to Admission  Medication Sig Dispense Refill Last Dose   acetaminophen (TYLENOL) 500 MG tablet Take 500 mg by mouth every 6 (six) hours as needed.   prn at prn   aspirin EC 81 MG tablet Take 81 mg by mouth daily. Swallow whole.   Past Week   atorvastatin (LIPITOR) 80 MG tablet Take 80 mg by mouth every evening.    Past Week   calcium acetate (PHOSLO) 667 MG capsule Take 3 capsules (2,001 mg total) by mouth 3 (three) times daily with meals.   Past Week   cholecalciferol (VITAMIN D) 25 MCG tablet Take 1 tablet (1,000 Units total) by mouth daily.   Past Week   cinacalcet (SENSIPAR) 30 MG tablet Take 30 mg by mouth daily at 6 (six) AM. Take with largest meal   Past Week   clopidogrel (PLAVIX)  75 MG tablet TAKE ONE TABLET BY MOUTH ONCE DAILY (Patient taking differently: Take 75 mg by mouth daily.) 30 tablet 5 Past Week   fluticasone (FLONASE) 50 MCG/ACT nasal spray Place 1 spray into both nostrils daily.   Past Week   gabapentin (NEURONTIN) 100 MG capsule Take 1 capsule (100 mg total) by mouth at bedtime. (Patient taking differently: Take 100 mg by mouth 2 (two) times daily.)   Past Week   glucose 4 GM chewable tablet Chew 1 tablet by mouth as needed for low blood sugar.   prn at prn   Ipratropium-Albuterol (COMBIVENT) 20-100 MCG/ACT AERS respimat Inhale 1 puff into the lungs every 6 (six) hours. (Patient taking differently: Inhale 1 puff into the lungs 2 (two) times daily.) 4 g 0 Past Week   levETIRAcetam (KEPPRA) 1000 MG tablet Take 1 tablet (1,000 mg total) by mouth daily. 30 tablet 0 Past Week   levETIRAcetam (KEPPRA) 250 MG tablet Take 250 mg by mouth 3 (three) times a week.   Past Week   lidocaine-prilocaine (EMLA) cream Apply 1 application topically as needed (for port access).    Past Week   losartan (  COZAAR) 100 MG tablet Take 100 mg by mouth at bedtime.   Past Week   metoprolol succinate (TOPROL-XL) 100 MG 24 hr tablet Take 100 mg by mouth daily. Take with or immediately following a meal.    Past Week   NOVOLIN 70/30 (70-30) 100 UNIT/ML injection SMARTSIG:28 Unit(s) SUB-Q Twice Daily   Past Week   omeprazole (PRILOSEC) 20 MG capsule Take 20 mg by mouth 2 (two) times daily.    Past Week   torsemide (DEMADEX) 100 MG tablet Take 100 mg by mouth daily.   Past Week   diphenoxylate-atropine (LOMOTIL) 2.5-0.025 MG tablet SMARTSIG:1 Tablet(s) By Mouth Every 12 Hours PRN   prn at prn   loperamide (IMODIUM) 2 MG capsule Take 2 mg by mouth 4 (four) times daily as needed.   prn at prn   oxyCODONE (OXY IR/ROXICODONE) 5 MG immediate release tablet Take 5 mg by mouth every 6 (six) hours as needed.   prn at prn   Scheduled:   sodium chloride   Intravenous Once   atorvastatin  80 mg Oral QPM    calcium acetate  2,001 mg Oral TID WC   Chlorhexidine Gluconate Cloth  6 each Topical Q0600   vitamin D3  1,000 Units Oral Daily   cinacalcet  30 mg Oral Q breakfast   docusate sodium  200 mg Oral BID   epoetin alfa       epoetin (EPOGEN/PROCRIT) injection  10,000 Units Intravenous Q M,W,F-HD   fluticasone  1 spray Each Nare Daily   gabapentin  100 mg Oral BID   insulin aspart  0-6 Units Subcutaneous TID WC   insulin aspart protamine- aspart  10 Units Subcutaneous BID WC   levETIRAcetam  1,000 mg Oral Daily   levETIRAcetam  250 mg Oral Once per day on Mon Wed Fri   losartan  100 mg Oral QHS   metoprolol succinate  100 mg Oral Q breakfast   midazolam       pantoprazole  40 mg Oral Daily   pentafluoroprop-tetrafluoroeth       polyethylene glycol  17 g Oral Daily   torsemide  100 mg Oral Q breakfast   Infusions:   sodium chloride 10 mL/hr at 03/25/22 0620    ceFAZolin (ANCEF) IV Stopped (03/24/22 1837)   heparin 1,500 Units/hr (03/25/22 0620)   PRN: sodium chloride, acetaminophen **OR** acetaminophen, diphenoxylate-atropine, epoetin alfa, glucose, ipratropium-albuterol, lidocaine-prilocaine, magnesium hydroxide, metoCLOPramide (REGLAN) injection, midazolam, oxyCODONE, pentafluoroprop-tetrafluoroeth, traZODone Anti-infectives (From admission, onward)    Start     Dose/Rate Route Frequency Ordered Stop   03/23/22 1800  ceFAZolin (ANCEF) IVPB 1 g/50 mL premix        1 g 100 mL/hr over 30 Minutes Intravenous Daily-1800 03/23/22 1243     03/20/22 1615  doxycycline (VIBRAMYCIN) 100 mg in sodium chloride 0.9 % 250 mL IVPB  Status:  Discontinued        100 mg 125 mL/hr over 120 Minutes Intravenous Every 12 hours 03/20/22 1604 03/23/22 1242   03/20/22 1000  cefTRIAXone (ROCEPHIN) 2 g in sodium chloride 0.9 % 100 mL IVPB  Status:  Discontinued        2 g 200 mL/hr over 30 Minutes Intravenous Every 24 hours 03/20/22 0148 03/23/22 1242   03/20/22 0200  cefTRIAXone (ROCEPHIN) 2 g in sodium  chloride 0.9 % 100 mL IVPB  Status:  Discontinued        2 g 200 mL/hr over 30 Minutes Intravenous Every 24 hours 03/20/22 0147  03/20/22 0148   03/19/22 2200  vancomycin (VANCOCIN) IVPB 1000 mg/200 mL premix        1,000 mg 200 mL/hr over 60 Minutes Intravenous  Once 03/19/22 2159 03/19/22 2329   03/19/22 2200  ceFEPIme (MAXIPIME) 2 g in sodium chloride 0.9 % 100 mL IVPB        2 g 200 mL/hr over 30 Minutes Intravenous  Once 03/19/22 2159 03/19/22 2322       Assessment: 71 year old female admitted with pulmonary embolism. Receiving procedure 9/1 to investigate CT scan.   Hgb low 2/2 CKD related anemia.  Last Eliquis dose 8/31 1000   Goal of Therapy:  Heparin level 0.3-0.7 units/ml Monitor platelets by anticoagulation protocol: Yes  Date Time aPTT/HL Rate/Comment 0901 0523 89 / >1.10 Therapeutic x 1 0901 1331 80 / ----  Therapeutic x 2  Plan:  Continune heparin infusion at 1500 units/hr Check anti-Xa level and aPTT tomorrow morning with AM labs Continue to monitor H&H and platelets daily while on heparin gtt.   [place] Pharmacist  03/25/2022 2:18 PM

## 2022-03-25 NOTE — Procedures (Signed)
Interventional Radiology Procedure Note  Procedure: US guided aspiration of large complex LEFT breast fluid collection revealing post surgical hematoma.  135 mL evacuated, remaining hematoma too thick for aspiration.  Sample sent for culture.   Complications: None  Estimated Blood Loss: None  Recommendations: - Cultures sent   Signed,  Criselda Peaches, MD

## 2022-03-25 NOTE — Progress Notes (Signed)
[place] for IV heparin Indication: pulmonary embolus  Allergies  Allergen Reactions   Oxycodone Nausea And Vomiting   Oxycodone-Acetaminophen Other (See Comments)   Wound Dressing Adhesive    Hydrocodone     Intolerant more than allergic   Tape Itching    Skin Dermatitis/itching (tape adhesive) Skin Dermatitis/itching (tape adhesive)   Tapentadol Itching    Skin Dermatitis/itching (tape adhesive) Skin Dermatitis/itching (tape adhesive)     Patient Measurements: Height: '5\' 9"'$  (175.3 cm) Weight: 118.5 kg (261 lb 3.9 oz) IBW/kg (Calculated) : 66.2 Heparin Dosing Weight: 123 kg  Vital Signs: Temp: 98.1 F (36.7 C) (09/01 0342) Temp Source: Oral (09/01 0342) BP: 161/69 (09/01 0342) Pulse Rate: 64 (09/01 0342)  Labs: Recent Labs    03/22/22 1153 03/23/22 0647 03/24/22 1656 03/25/22 0523  HGB  --  7.5*  --  7.2*  HCT  --  24.3*  --  23.3*  PLT  --  235  --  255  APTT  --   --   --  89*  LABPROT  --   --  19.6*  --   INR  --   --  1.7*  --   HEPARINUNFRC <0.10*  --   --  >1.10*  CREATININE  --  9.31*  --  8.43*     Estimated Creatinine Clearance: 8.4 mL/min (A) (by C-G formula based on SCr of 8.43 mg/dL (H)).   Medications:  Medications Prior to Admission  Medication Sig Dispense Refill Last Dose   acetaminophen (TYLENOL) 500 MG tablet Take 500 mg by mouth every 6 (six) hours as needed.   prn at prn   aspirin EC 81 MG tablet Take 81 mg by mouth daily. Swallow whole.   Past Week   atorvastatin (LIPITOR) 80 MG tablet Take 80 mg by mouth every evening.    Past Week   calcium acetate (PHOSLO) 667 MG capsule Take 3 capsules (2,001 mg total) by mouth 3 (three) times daily with meals.   Past Week   cholecalciferol (VITAMIN D) 25 MCG tablet Take 1 tablet (1,000 Units total) by mouth daily.   Past Week   cinacalcet (SENSIPAR) 30 MG tablet Take 30 mg by mouth daily at 6 (six) AM. Take with largest meal   Past Week   clopidogrel  (PLAVIX) 75 MG tablet TAKE ONE TABLET BY MOUTH ONCE DAILY (Patient taking differently: Take 75 mg by mouth daily.) 30 tablet 5 Past Week   fluticasone (FLONASE) 50 MCG/ACT nasal spray Place 1 spray into both nostrils daily.   Past Week   gabapentin (NEURONTIN) 100 MG capsule Take 1 capsule (100 mg total) by mouth at bedtime. (Patient taking differently: Take 100 mg by mouth 2 (two) times daily.)   Past Week   glucose 4 GM chewable tablet Chew 1 tablet by mouth as needed for low blood sugar.   prn at prn   Ipratropium-Albuterol (COMBIVENT) 20-100 MCG/ACT AERS respimat Inhale 1 puff into the lungs every 6 (six) hours. (Patient taking differently: Inhale 1 puff into the lungs 2 (two) times daily.) 4 g 0 Past Week   levETIRAcetam (KEPPRA) 1000 MG tablet Take 1 tablet (1,000 mg total) by mouth daily. 30 tablet 0 Past Week   levETIRAcetam (KEPPRA) 250 MG tablet Take 250 mg by mouth 3 (three) times a week.   Past Week   lidocaine-prilocaine (EMLA) cream Apply 1 application topically as needed (for port access).    Past Week   losartan (  COZAAR) 100 MG tablet Take 100 mg by mouth at bedtime.   Past Week   metoprolol succinate (TOPROL-XL) 100 MG 24 hr tablet Take 100 mg by mouth daily. Take with or immediately following a meal.    Past Week   NOVOLIN 70/30 (70-30) 100 UNIT/ML injection SMARTSIG:28 Unit(s) SUB-Q Twice Daily   Past Week   omeprazole (PRILOSEC) 20 MG capsule Take 20 mg by mouth 2 (two) times daily.    Past Week   torsemide (DEMADEX) 100 MG tablet Take 100 mg by mouth daily.   Past Week   diphenoxylate-atropine (LOMOTIL) 2.5-0.025 MG tablet SMARTSIG:1 Tablet(s) By Mouth Every 12 Hours PRN   prn at prn   loperamide (IMODIUM) 2 MG capsule Take 2 mg by mouth 4 (four) times daily as needed.   prn at prn   oxyCODONE (OXY IR/ROXICODONE) 5 MG immediate release tablet Take 5 mg by mouth every 6 (six) hours as needed.   prn at prn   Scheduled:   atorvastatin  80 mg Oral QPM   calcium acetate  2,001 mg  Oral TID WC   Chlorhexidine Gluconate Cloth  6 each Topical Q0600   vitamin D3  1,000 Units Oral Daily   cinacalcet  30 mg Oral Q breakfast   docusate sodium  200 mg Oral BID   epoetin (EPOGEN/PROCRIT) injection  10,000 Units Intravenous Q M,W,F-HD   fluticasone  1 spray Each Nare Daily   gabapentin  100 mg Oral BID   insulin aspart  0-6 Units Subcutaneous TID WC   insulin aspart protamine- aspart  10 Units Subcutaneous BID WC   levETIRAcetam  1,000 mg Oral Daily   levETIRAcetam  250 mg Oral Once per day on Mon Wed Fri   losartan  100 mg Oral QHS   metoprolol succinate  100 mg Oral Q breakfast   pantoprazole  40 mg Oral Daily   polyethylene glycol  17 g Oral Daily   torsemide  100 mg Oral Q breakfast   Infusions:   sodium chloride 10 mL/hr at 03/21/22 0651    ceFAZolin (ANCEF) IV Stopped (03/24/22 1837)   heparin 1,500 Units/hr (03/24/22 2158)   PRN: sodium chloride, acetaminophen **OR** acetaminophen, diphenoxylate-atropine, glucose, ipratropium-albuterol, lidocaine-prilocaine, magnesium hydroxide, metoCLOPramide (REGLAN) injection, oxyCODONE, traZODone Anti-infectives (From admission, onward)    Start     Dose/Rate Route Frequency Ordered Stop   03/23/22 1800  ceFAZolin (ANCEF) IVPB 1 g/50 mL premix        1 g 100 mL/hr over 30 Minutes Intravenous Daily-1800 03/23/22 1243     03/20/22 1615  doxycycline (VIBRAMYCIN) 100 mg in sodium chloride 0.9 % 250 mL IVPB  Status:  Discontinued        100 mg 125 mL/hr over 120 Minutes Intravenous Every 12 hours 03/20/22 1604 03/23/22 1242   03/20/22 1000  cefTRIAXone (ROCEPHIN) 2 g in sodium chloride 0.9 % 100 mL IVPB  Status:  Discontinued        2 g 200 mL/hr over 30 Minutes Intravenous Every 24 hours 03/20/22 0148 03/23/22 1242   03/20/22 0200  cefTRIAXone (ROCEPHIN) 2 g in sodium chloride 0.9 % 100 mL IVPB  Status:  Discontinued        2 g 200 mL/hr over 30 Minutes Intravenous Every 24 hours 03/20/22 0147 03/20/22 0148   03/19/22 2200   vancomycin (VANCOCIN) IVPB 1000 mg/200 mL premix        1,000 mg 200 mL/hr over 60 Minutes Intravenous  Once 03/19/22 2159 03/19/22  2329   03/19/22 2200  ceFEPIme (MAXIPIME) 2 g in sodium chloride 0.9 % 100 mL IVPB        2 g 200 mL/hr over 30 Minutes Intravenous  Once 03/19/22 2159 03/19/22 2322       Assessment: 72 year old female admitted with pulmonary embolism. Receiving procedure 9/1 to investigate CT scan.   Hgb low 2/2 CKD related anemia.  Last Eliquis dose 8/31 1000   Goal of Therapy:  Heparin level 0.3-0.7 units/ml Monitor platelets by anticoagulation protocol: Yes   Plan:  9/1 @ 0523:  aPTT = 89, therapeutic X 1 ,  HL = >1.10 (elevated) - aPTT therapeutic X 1 but HL still elevated, will use aPTT to guide dosing until HL and aPTT correlate.  - Will continue pt on current rate and draw confirmation aPTT in 8     hrs on 9/1 @ 1300. - Will recheck HL on 9/2 @ 0500.  Omran Keelin D Clinical Pharmacist  03/25/2022 6:19 AM

## 2022-03-25 NOTE — Progress Notes (Signed)
[facility]  ROUNDING NOTE   Subjective:   Cynthia Dean is a [age]-year-old female with past medical history including COPD, diabetes, carotid stenosis, hypertension, and end-stage renal disease on hemodialysis.  Patient presents to the emergency department with complaints of weakness, fatigue and fever.  Patient has been admitted for ESRD on hemodialysis ([place]) [N18.6, Z99.2] Acute pulmonary embolism without acute cor pulmonale ([place]) [I26.99] Sepsis with acute hypoxic respiratory failure and septic shock, due to unspecified organism ([place]) [A41.9, R65.21, J96.01]  Patient is known to our practice and receives outpatient dialysis treatments at [facility] on a TTS schedule, supervised by Dr. Candiss Norse.   Patient seen sitting up in bed, alert and oriented Denies pain or discomfort Appetite appropriate, denies nausea and vomiting Scheduled for dialysis later today  Objective:  Vital signs in last 24 hours:  Temp:  [98.1 F (36.7 C)-98.7 F (37.1 C)] 98.3 F (36.8 C) (09/01 1358) Pulse Rate:  [63-72] 67 (09/01 1401) Resp:  [13-31] 31 (09/01 1401) BP: (98-185)/(42-83) 136/83 (09/01 1401) SpO2:  [92 %-100 %] 100 % (09/01 1358) Weight:  [116 kg-118.5 kg] 116 kg (09/01 1149)  Weight change: -4 kg Filed Weights   03/23/22 1814 03/25/22 0342 03/25/22 1149  Weight: 122.5 kg 118.5 kg 116 kg    Intake/Output: I/O last 3 completed shifts: In: 358 [I.V.:258; IV Piggyback:100] Out: 0    Intake/Output this shift:  No intake/output data recorded.  Physical Exam: General: NAD, resting comfortably  Head: Normocephalic, atraumatic. Moist oral mucosal membranes  Eyes: Anicteric  Lungs:  Clear to auscultation, normal effort  Heart: Regular rate and rhythm  Abdomen:  Soft, nontender, obese  Extremities: 1+ peripheral edema.  Neurologic: Alert, oriented to person  Skin: No lesions  Access: Left aVF    Basic Metabolic Panel: Recent Labs  Lab 03/20/22 0448  03/21/22 0655 03/22/22 0156 03/23/22 0647 03/25/22 0523  NA 136 135 136 137 137  K 3.8 4.6 3.6 4.4 4.2  CL 109 101 98 101 101  CO2 21* '22 26 26 25  '$ GLUCOSE 105* 84 91 101* 119*  BUN 47* 60* 32* 38* 27*  CREATININE 9.97* 12.47* 7.57* 9.31* 8.43*  CALCIUM 7.9* 8.7* 8.7* 9.5 9.4     Liver Function Tests: Recent Labs  Lab 03/19/22 2127 03/25/22 0523  AST 15 14*  ALT 7 8  ALKPHOS 89 73  BILITOT 1.2 0.7  PROT 7.2 6.1*  ALBUMIN 3.4* 2.6*    No results for input(s): "LIPASE", "AMYLASE" in the last 168 hours. No results for input(s): "AMMONIA" in the last 168 hours.  CBC: Recent Labs  Lab 03/19/22 2127 03/20/22 0448 03/20/22 1901 03/21/22 0655 03/21/22 1847 03/22/22 0156 03/23/22 0647 03/25/22 0523  WBC 16.5* 14.4*  --  9.6  --  9.7 10.4 10.9*  NEUTROABS 13.0*  --   --   --   --   --   --   --   HGB 8.1* 6.6*   < > 7.1* 7.5* 8.0* 7.5* 7.2*  HCT 26.1* 21.8*   < > 22.7* 23.8* 25.3* 24.3* 23.3*  MCV 90.6 93.2  --  87.3  --  86.6 87.7 89.6  PLT 214 163  --  168  --  210 235 255   < > = values in this interval not displayed.     Cardiac Enzymes: No results for input(s): "CKTOTAL", "CKMB", "CKMBINDEX", "TROPONINI" in the last 168 hours.  BNP: Invalid input(s): "POCBNP"  CBG: Recent Labs  Lab  03/24/22 1149 03/24/22 1635 03/24/22 2153 03/25/22 0719 03/25/22 1114  GLUCAP 132* 157* 146* 129* 157*     Microbiology: Results for orders placed or performed during the hospital encounter of 03/19/22  Resp Panel by RT-PCR (Flu A&B, Covid) Anterior Nasal Swab     Status: None   Collection Time: 03/19/22  9:28 PM   Specimen: Anterior Nasal Swab  Result Value Ref Range Status   SARS Coronavirus 2 by RT PCR NEGATIVE NEGATIVE Final    Comment: (NOTE) SARS-CoV-2 target nucleic acids are NOT DETECTED.  The SARS-CoV-2 RNA is generally detectable in upper respiratory specimens during the acute phase of infection. The lowest concentration of SARS-CoV-2 viral copies this  assay can detect is 138 copies/mL. A negative result does not preclude SARS-Cov-2 infection and should not be used as the sole basis for treatment or other patient management decisions. A negative result may occur with  improper specimen collection/handling, submission of specimen other than nasopharyngeal swab, presence of viral mutation(s) within the areas targeted by this assay, and inadequate number of viral copies(<138 copies/mL). A negative result must be combined with clinical observations, patient history, and epidemiological information. The expected result is Negative.  Fact Sheet for Patients:  EntrepreneurPulse.com.au  Fact Sheet for Healthcare Providers:  IncredibleEmployment.be  This test is no t yet approved or cleared by the [place] FDA and  has been authorized for detection and/or diagnosis of SARS-CoV-2 by FDA under an Emergency Use Authorization (EUA). This EUA will remain  in effect (meaning this test can be used) for the duration of the COVID-19 declaration under Section 564(b)(1) of the Act, 21 U.S.C.section 360bbb-3(b)(1), unless the authorization is terminated  or revoked sooner.       Influenza A by PCR NEGATIVE NEGATIVE Final   Influenza B by PCR NEGATIVE NEGATIVE Final    Comment: (NOTE) The Xpert Xpress SARS-CoV-2/FLU/RSV plus assay is intended as an aid in the diagnosis of influenza from Nasopharyngeal swab specimens and should not be used as a sole basis for treatment. Nasal washings and aspirates are unacceptable for Xpert Xpress SARS-CoV-2/FLU/RSV testing.  Fact Sheet for Patients: EntrepreneurPulse.com.au  Fact Sheet for Healthcare Providers: IncredibleEmployment.be  This test is not yet approved or cleared by the [place] FDA and has been authorized for detection and/or diagnosis of SARS-CoV-2 by FDA under an Emergency Use Authorization (EUA). This EUA will  remain in effect (meaning this test can be used) for the duration of the COVID-19 declaration under Section 564(b)(1) of the Act, 21 U.S.C. section 360bbb-3(b)(1), unless the authorization is terminated or revoked.  Performed at [facility], [address] [place], [place] [address]   Blood Culture (routine x 2)     Status: Abnormal   Collection Time: 03/19/22  9:28 PM   Specimen: BLOOD  Result Value Ref Range Status   Specimen Description   Final    BLOOD RIGHT ANTECUBITAL Performed at [facility], [address] [place], [place] [address]    Special Requests   Final    BOTTLES DRAWN AEROBIC AND ANAEROBIC Blood Culture adequate volume Performed at [facility], [address] [place], [place] [address]    Culture  Setup Time   Final    GRAM POSITIVE COCCI IN BOTH AEROBIC AND ANAEROBIC BOTTLES CRITICAL RESULT CALLED TO, READ BACK BY AND VERIFIED WITH: CAROLYN CHILDS 03/20/22 1457 AMK Performed at [facility], [address], [place], [place] [address]    Culture STAPHYLOCOCCUS CAPITIS (A)  Final   Report Status 03/23/2022 FINAL  Final   Organism ID, Bacteria STAPHYLOCOCCUS CAPITIS  Final      Susceptibility   Staphylococcus capitis - MIC*    CIPROFLOXACIN <=0.5 SENSITIVE Sensitive     ERYTHROMYCIN <=0.25 SENSITIVE Sensitive     GENTAMICIN <=0.5 SENSITIVE Sensitive     OXACILLIN <=0.25 SENSITIVE Sensitive     TETRACYCLINE <=1 SENSITIVE Sensitive     VANCOMYCIN <=0.5 SENSITIVE Sensitive     TRIMETH/SULFA <=10 SENSITIVE Sensitive     CLINDAMYCIN <=0.25 SENSITIVE Sensitive     RIFAMPIN <=0.5 SENSITIVE Sensitive     Inducible Clindamycin NEGATIVE Sensitive     * STAPHYLOCOCCUS CAPITIS  Blood Culture ID Panel (Reflexed)     Status: Abnormal   Collection Time: 03/19/22  9:28 PM  Result Value Ref Range Status   Enterococcus faecalis NOT DETECTED NOT DETECTED Final   Enterococcus Faecium NOT DETECTED NOT DETECTED Final   Listeria  monocytogenes NOT DETECTED NOT DETECTED Final   Staphylococcus species DETECTED (A) NOT DETECTED Final    Comment: CRITICAL RESULT CALLED TO, READ BACK BY AND VERIFIED WITH: CAROLYN CHILDS 03/20/22 1457 AMK    Staphylococcus aureus (BCID) NOT DETECTED NOT DETECTED Final   Staphylococcus epidermidis NOT DETECTED NOT DETECTED Final   Staphylococcus lugdunensis NOT DETECTED NOT DETECTED Final   Streptococcus species NOT DETECTED NOT DETECTED Final   Streptococcus agalactiae NOT DETECTED NOT DETECTED Final   Streptococcus pneumoniae NOT DETECTED NOT DETECTED Final   Streptococcus pyogenes NOT DETECTED NOT DETECTED Final   A.calcoaceticus-baumannii NOT DETECTED NOT DETECTED Final   Bacteroides fragilis NOT DETECTED NOT DETECTED Final   Enterobacterales NOT DETECTED NOT DETECTED Final   Enterobacter cloacae complex NOT DETECTED NOT DETECTED Final   Escherichia coli NOT DETECTED NOT DETECTED Final   Klebsiella aerogenes NOT DETECTED NOT DETECTED Final   Klebsiella oxytoca NOT DETECTED NOT DETECTED Final   Klebsiella pneumoniae NOT DETECTED NOT DETECTED Final   Proteus species NOT DETECTED NOT DETECTED Final   Salmonella species NOT DETECTED NOT DETECTED Final   Serratia marcescens NOT DETECTED NOT DETECTED Final   Haemophilus influenzae NOT DETECTED NOT DETECTED Final   Neisseria meningitidis NOT DETECTED NOT DETECTED Final   Pseudomonas aeruginosa NOT DETECTED NOT DETECTED Final   Stenotrophomonas maltophilia NOT DETECTED NOT DETECTED Final   Candida albicans NOT DETECTED NOT DETECTED Final   Candida auris NOT DETECTED NOT DETECTED Final   Candida glabrata NOT DETECTED NOT DETECTED Final   Candida krusei NOT DETECTED NOT DETECTED Final   Candida parapsilosis NOT DETECTED NOT DETECTED Final   Candida tropicalis NOT DETECTED NOT DETECTED Final   Cryptococcus neoformans/gattii NOT DETECTED NOT DETECTED Final    Comment: Performed at [facility], [address] [place], [place]  [address]  Blood Culture (routine x 2)     Status: Abnormal   Collection Time: 03/19/22 10:23 PM   Specimen: BLOOD  Result Value Ref Range Status   Specimen Description   Final    BLOOD BLOOD RIGHT FOREARM Performed at [facility], [address], [place], [place] [address]    Special Requests   Final    BOTTLES DRAWN AEROBIC AND ANAEROBIC Blood Culture adequate volume Performed at [facility], [address] [place], [place] [address]    Culture  Setup Time   Final    GRAM POSITIVE COCCI IN BOTH AEROBIC AND ANAEROBIC BOTTLES CRITICAL RESULT CALLED TO, READ BACK BY AND VERIFIED WITH: ELVERA MADUEME 03/20/22 1545  AMK Performed at [facility], [address] [place], [place] [address]    Culture (A)  Final    STAPHYLOCOCCUS CAPITIS SUSCEPTIBILITIES PERFORMED ON PREVIOUS CULTURE WITHIN THE LAST 5 DAYS. Performed at [facility], [place] [address], [place], [place] [address]    Report Status 03/22/2022 FINAL  Final  Urine Culture     Status: Abnormal   Collection Time: 03/20/22  1:25 AM   Specimen: In/Out Cath Urine  Result Value Ref Range Status   Specimen Description   Final    IN/OUT CATH URINE Performed at [facility], [address] [place], [place] [address]    Special Requests   Final    NONE Performed at [facility], [address] [place], [place] [address]    Culture 4,000 COLONIES/mL STAPHYLOCOCCUS EPIDERMIDIS (A)  Final   Report Status 03/22/2022 FINAL  Final   Organism ID, Bacteria STAPHYLOCOCCUS EPIDERMIDIS (A)  Final      Susceptibility   Staphylococcus epidermidis - MIC*    CIPROFLOXACIN <=0.5 SENSITIVE Sensitive     GENTAMICIN <=0.5 SENSITIVE Sensitive     NITROFURANTOIN <=16 SENSITIVE Sensitive     OXACILLIN <=0.25 SENSITIVE Sensitive     TETRACYCLINE <=1 SENSITIVE Sensitive     VANCOMYCIN 2 SENSITIVE Sensitive     TRIMETH/SULFA 80 RESISTANT Resistant     CLINDAMYCIN >=8 RESISTANT Resistant      RIFAMPIN <=0.5 SENSITIVE Sensitive     Inducible Clindamycin NEGATIVE Sensitive     * 4,000 COLONIES/mL STAPHYLOCOCCUS EPIDERMIDIS  Culture, blood (Routine X 2) w Reflex to ID Panel     Status: None (Preliminary result)   Collection Time: 03/21/22 11:00 AM   Specimen: BLOOD  Result Value Ref Range Status   Specimen Description BLOOD BLOOD RIGHT FOREARM  Final   Special Requests   Final    BOTTLES DRAWN AEROBIC AND ANAEROBIC Blood Culture adequate volume   Culture   Final    NO GROWTH 4 DAYS Performed at [facility], [address] [place], [place] [address]    Report Status PENDING  Incomplete  Culture, blood (Routine X 2) w Reflex to ID Panel     Status: None (Preliminary result)   Collection Time: 03/22/22 11:53 AM   Specimen: BLOOD RIGHT HAND  Result Value Ref Range Status   Specimen Description BLOOD RIGHT HAND  Final   Special Requests   Final    BOTTLES DRAWN AEROBIC AND ANAEROBIC Blood Culture adequate volume   Culture   Final    NO GROWTH 3 DAYS Performed at [facility], [address] [place], [place] [address]    Report Status PENDING  Incomplete    Coagulation Studies: Recent Labs    03/24/22 1656  LABPROT 19.6*  INR 1.7*     Urinalysis: No results for input(s): "COLORURINE", "LABSPEC", "PHURINE", "GLUCOSEU", "HGBUR", "BILIRUBINUR", "KETONESUR", "PROTEINUR", "UROBILINOGEN", "NITRITE", "LEUKOCYTESUR" in the last 72 hours.  Invalid input(s): "APPERANCEUR"     Imaging: CT CHEST W CONTRAST  Result Date: 03/24/2022 CLINICAL DATA:  Breast cancer, status post recent left lumpectomy, cellulitis and seroma, rule out necrotizing infection * Tracking Code: BO * EXAM: CT CHEST WITH CONTRAST TECHNIQUE: Multidetector CT imaging of the chest was performed during intravenous contrast administration. RADIATION DOSE REDUCTION: This exam was performed according to the departmental dose-optimization program which includes automated exposure control,  adjustment of the mA and/or kV according to patient size and/or use of iterative reconstruction technique. CONTRAST:  1m  OMNIPAQUE IOHEXOL 300 MG/ML  SOLN COMPARISON:  CT chest angiogram, 03/19/2022, CT chest, 12/18/2013 FINDINGS: Cardiovascular: Right chest port catheter. Aortic atherosclerosis. Cardiomegaly. Three-vessel coronary artery calcifications. No pericardial effusion. Mediastinum/Nodes: Numerous prominent mediastinal and bilateral hilar lymph nodes unchanged compared to recent prior examination and very similar to remote prior examination dated 12/18/2013. Largest pretracheal node measures 2.3 x 1.7 cm (series 7, image 56). Thyroid gland, trachea, and esophagus demonstrate no significant findings. Lungs/Pleura: Innumerable small pulmonary nodules throughout the lungs, not changed from recent prior examination, although the great majority of which appear new compared to more remote prior examination dated 12/18/2013. Example nodule of the right apex measures 0.6 cm (series 3, image 32). Additional index nodule of the anterior left upper lobe measures 0.7 cm (series 3, image 48). Diffuse bilateral bronchial wall thickening and extensive interlobular septal thickening throughout, including non dependent portions of the lungs. Generally bandlike scarring and or atelectasis of the bilateral lung bases. No pleural effusion or pneumothorax. Upper Abdomen: No acute abnormality. Small, benign left adrenal myelolipoma which no further specific follow-up or characterization required (series 2, image 138). Musculoskeletal: Large, partially imaged fluid collection within the left breast containing scattered internal air loculations with extensive adjacent fat stranding and overlying soft tissue thickening, although incompletely imaged due to large body habitus, measures at least 8.5 cm (series 7, image 92). Additional adjacent collection within the left axilla is fully imaged and measures 11.4 x 5.8 cm (series 7,  image 42). No acute osseous findings. IMPRESSION: 1. Large, partially imaged fluid collection within the left breast containing scattered internal air loculations with extensive adjacent fat stranding and overlying soft tissue thickening, although incompletely imaged due to large body habitus, measures at least 8.5 cm. Persistent presence of air within this fluid collection is worrisome for gas-forming infection. 2. Additional adjacent collection within the left axilla is fully imaged and measures 11.4 x 5.8 cm without evidence of gas content and of simple fluid attenuation, most consistent with seroma. The presence or absence of infection within either of these fluid collections is not strictly established by CT. 3. Innumerable small pulmonary nodules throughout the lungs, not changed from recent prior examination, although the great majority of which appear new compared to more remote prior examination dated 12/18/2013. These are worrisome for pulmonary metastatic disease in the setting of breast cancer although potentially infectious or inflammatory. 4. Diffuse bilateral bronchial wall thickening and interlobular septal thickening throughout, possibly reflecting pulmonary edema, although interlobular septal thickening is worrisome as a potential feature of lymphangitic metastatic disease in the setting of breast cancer. 5. Numerous prominent mediastinal and bilateral hilar lymph nodes unchanged compared to recent prior examination and very similar to remote prior examination dated 12/18/2013, most likely benign and reactive. 6. Cardiomegaly and coronary artery disease. Aortic Atherosclerosis (ICD10-I70.0). Electronically Signed   By: Delanna Ahmadi M.D.   On: 03/24/2022 16:48   US BREAST LTD UNI LEFT INC AXILLA  Result Date: 03/24/2022 CLINICAL DATA:  Mass of left breast, assess for abscess. Patient is status post left breast lumpectomy August 11th 2023. EXAM: ULTRASOUND OF THE LEFT BREAST COMPARISON:  Prior  films FINDINGS: Targeted ultrasound is performed, showing complicated cystic lesion at the palpable area left breast 1-3 o'clock 12 cm from nipple measuring 14.5 x 6.3 x 9.2 cm. This may represent postsurgical seroma but abscess is not excluded based on ultrasound. IMPRESSION: Benign findings. Large mass at the left breast 1-3 o'clock 12 cm from nipple. This may represent postsurgical seroma but  abscess is not excluded based on ultrasound. RECOMMENDATION: Management on clinical basis. I have discussed the findings and recommendations with the patient. If applicable, a reminder letter will be sent to the patient regarding the next appointment. BI-RADS CATEGORY  2: Benign. Electronically Signed   By: Abelardo Diesel M.D.   On: 03/24/2022 10:15     Medications:    sodium chloride 10 mL/hr at 03/25/22 0867    ceFAZolin (ANCEF) IV Stopped (03/24/22 1837)   heparin 1,500 Units/hr (03/25/22 0620)    sodium chloride   Intravenous Once   atorvastatin  80 mg Oral QPM   calcium acetate  2,001 mg Oral TID WC   Chlorhexidine Gluconate Cloth  6 each Topical Q0600   vitamin D3  1,000 Units Oral Daily   cinacalcet  30 mg Oral Q breakfast   docusate sodium  200 mg Oral BID   epoetin alfa       epoetin (EPOGEN/PROCRIT) injection  10,000 Units Intravenous Q M,W,F-HD   fluticasone  1 spray Each Nare Daily   gabapentin  100 mg Oral BID   insulin aspart  0-6 Units Subcutaneous TID WC   insulin aspart protamine- aspart  10 Units Subcutaneous BID WC   levETIRAcetam  1,000 mg Oral Daily   levETIRAcetam  250 mg Oral Once per day on Mon Wed Fri   losartan  100 mg Oral QHS   metoprolol succinate  100 mg Oral Q breakfast   midazolam       pantoprazole  40 mg Oral Daily   pentafluoroprop-tetrafluoroeth       polyethylene glycol  17 g Oral Daily   torsemide  100 mg Oral Q breakfast   sodium chloride, acetaminophen **OR** acetaminophen, diphenoxylate-atropine, epoetin alfa, glucose, ipratropium-albuterol,  lidocaine-prilocaine, magnesium hydroxide, metoCLOPramide (REGLAN) injection, midazolam, oxyCODONE, pentafluoroprop-tetrafluoroeth, traZODone  Assessment/ Plan:  [name] is a 71 y.o.  female ast medical history including COPD, diabetes, carotid stenosis, hypertension, and end-stage renal disease on hemodialysis.  Patient presents to the emergency department with complaints of weakness, fatigue and fever.  Patient has been admitted for ESRD on hemodialysis ([place]) [N18.6, Z99.2] Acute pulmonary embolism without acute cor pulmonale ([place]) [I26.99] Sepsis with acute hypoxic respiratory failure and septic shock, due to unspecified organism ([place]) [A41.9, R65.21, J96.01]  [facility]/MWF/left aVF  End-stage renal disease on hemodialysis.  Will maintain outpatient schedule if possible.  Scheduled to receive dialysis later today, UF goal 1 to 1.5 L as tolerated.  Next treatment scheduled for Monday..  2. Anemia of chronic kidney disease Lab Results  Component Value Date   HGB 7.2 (L) 03/25/2022    Patient receives [facility] outpatient.  Hemoglobin continues to decrease.  Primary team has ordered 1 unit blood transfusion to be given during dialysis.  We will also continue EPO with treatments.  3. Secondary Hyperparathyroidism: with outpatient labs: PTH 613, phosphorus 4.8, calcium 9.0 on 03/07/2022.   Lab Results  Component Value Date   PTH 131 (H) 03/14/2018   CALCIUM 9.4 03/25/2022   PHOS 3.0 10/25/2019  Bone minerals remain within acceptable range. Continue calcium acetate with meals.  4. Diabetes mellitus type II with chronic kidney disease: insulin dependent. Home regimen includes Novolin. Most recent hemoglobin A1c is 7.5 on 01/29/20.  Glucose appears stable.  Primary team to manage.  5.  Hypertension with chronic kidney disease.  Home regimen includes losartan, metoprolol, and torsemide.  Patient currently receiving these medications.  Blood pressure  178/74.   LOS:  [place] 9/1/20232:18 PM

## 2022-03-25 NOTE — Progress Notes (Signed)
[facility] SURGICAL PROGRESS NOTE (cpt [phone])  Hospital Day(s): 5.   Interval History: Patient seen and examined, no acute events or new complaints overnight. Patient reports she is doing well; anxious. Mild left axillary pain. No fever, chills. Mild leukocytosis this morning; 10.9K. Hgb to 7.2; seems to be near baseline compared to recent labs. BMP consistent with ESRD. She is currently on Ancef. Plan for US guided aspiration and Cx with IR today. NPO this morning.   Review of Systems:  Constitutional: denies fever, chills  HEENT: denies cough or congestion  Respiratory: denies any shortness of breath  Cardiovascular: denies chest pain or palpitations  Gastrointestinal: denies N/V Genitourinary: denies burning with urination or urinary frequency Musculoskeletal: denies pain, decreased motor or sensation  Vital signs in last 24 hours: [min-max] current  Temp:  [97.9 F (36.6 C)-98.7 F (37.1 C)] 98.3 F (36.8 C) (09/01 0718) Pulse Rate:  [64-70] 68 (09/01 0718) Resp:  [16-20] 20 (09/01 0718) BP: (119-173)/(32-73) 173/73 (09/01 0718) SpO2:  [93 %-100 %] 100 % (09/01 0342) Weight:  [118.5 kg] 118.5 kg (09/01 0342)     Height: '5\' 9"'$  (175.3 cm) Weight: 118.5 kg BMI (Calculated): 38.56   Intake/Output last 2 shifts:  08/31 0701 - 09/01 0700 In: 358 [I.V.:258; IV Piggyback:100] Out: 0    Physical Exam:  Constitutional: alert, cooperative and no distress  HENT: normocephalic without obvious abnormality  Eyes: PERRL, EOM's grossly intact and symmetric  Respiratory: breathing non-labored at rest  Cardiovascular: regular rate and sinus rhythm  Integumentary: Daughter present as chaperone, incision to the upper outer quadrant of the left breast/left axilla is intact, gauze saturated with what appears to be serous fluid, this does not appear overtly erythematous today   Labs:     Latest Ref Rng & Units 03/25/2022    5:23 AM 03/23/2022    6:47 AM 03/22/2022    1:56 AM   CBC  WBC 4.0 - 10.5 K/uL 10.9  10.4  9.7   Hemoglobin 12.0 - 15.0 g/dL 7.2  7.5  8.0   Hematocrit 36.0 - 46.0 % 23.3  24.3  25.3   Platelets 150 - 400 K/uL 255  235  210       Latest Ref Rng & Units 03/25/2022    5:23 AM 03/23/2022    6:47 AM 03/22/2022    1:56 AM  CMP  Glucose 70 - 99 mg/dL 119  101  91   BUN 8 - 23 mg/dL 27  38  32   Creatinine 0.44 - 1.00 mg/dL 8.43  9.31  7.57   Sodium 135 - 145 mmol/L 137  137  136   Potassium 3.5 - 5.1 mmol/L 4.2  4.4  3.6   Chloride 98 - 111 mmol/L 101  101  98   CO2 22 - 32 mmol/L '25  26  26   '$ Calcium 8.9 - 10.3 mg/dL 9.4  9.5  8.7   Total Protein 6.5 - 8.1 g/dL 6.1     Total Bilirubin 0.3 - 1.2 mg/dL 0.7     Alkaline Phos 38 - 126 U/L 73     AST 15 - 41 U/L 14     ALT 0 - 44 U/L 8       Imaging studies: No new pertinent imaging studies   Assessment/Plan: (ICD-10's: N64.89) 71 y.o. female with left breast seroma vs abscess (favor seroma) s/p left breast lumpectomy and SLNB at [facility] on 51/02 for IDC, complicated by newly diagnosed PE,  history of ESRD on HD   - Appreciate IR willingness to assist; plan for aspiration and Cx today  - NPO for planned IR procedure; should be okay to resume diet after this  - Continue IV Abx (Ancef); ID following; will ask to culture aspirate today  - Monitor leukocytosis   - Pain control prn; antiemetics prn - Further management per primary service; we will follow    All of the above findings and recommendations were discussed with the patient, patient's family (daughter at bedside), and the medical team, and all of patient's and family's questions were answered to their expressed satisfaction.  -- Edison Simon, PA-C [facility] 03/25/2022, 7:22 AM M-F: 7am - 4pm

## 2022-03-25 NOTE — Care Management Important Message (Signed)
Important Message  Patient Details  Name: Cynthia Dean MRN: [id number] Date of Birth: 1951-05-31   Medicare Important Message Given:  Yes     [name] 03/25/2022, 12:00 PM

## 2022-03-25 NOTE — Progress Notes (Signed)
PT Cancellation Note  Patient Details Name: Cynthia Dean MRN: [id number] DOB: 1950/09/09   Cancelled Treatment:    Reason Eval/Treat Not Completed: Other (comment). Pt received upright in bed with family at bedside. Began initiating treatment with LE therex when transport arrived for HD. PT to re-attempt as able and medically appropriate. D/c recs for STR remain appropriate.    Salem Caster. Fairly IV, PT, DPT Physical Therapist- [facility]  03/25/2022, 11:32 AM

## 2022-03-25 NOTE — Progress Notes (Signed)
Received patient in bed to unit.  Alert and oriented.  Informed consent signed and in chart.   Treatment initiated: 1210   Treatment completed: 1618 1unit of PRBC given.  Patient tolerated well.  Transported back to the room  Alert, without acute distress.  Hand-off given to patient's nurse.   Access used: AVG  Access issues: none  Total UF removed: 2L Medication(s) given: epogen 10,000units Post HD VS: 98.2, 66, 124/62, 18, 100 on 2L of [place] Post HD weight: 115kg   [facility]

## 2022-03-25 NOTE — Progress Notes (Signed)
PROGRESS NOTE   HPI was taken from Dr. Sidney Ace: Cynthia Dean is a 71 y.o. African-American female with medical history significant for COPD, carotid stenosis, type 2 diabetes mellitus, end-stage renal disease on hemodialysis on MWF, hypertension and dyslipidemia, as well as invasive ductal carcinoma of the left breast status post surgical excision at [facility] recently, who presented to the emergency room with acute onset of generalized weakness and fatigue for the last couple of days.  She missed hemodialysis session yesterday.  She was fairly confused in the ER.  She has been having dyspnea with mild cough and occasional wheezing.  She admitted to fever and chills.  No chest pain or palpitations.  No nausea or vomiting or diarrhea or abdominal pain.  No bleeding diathesis.   ED Course: Upon presentation to the emergency room temperature was 102.9 with BP 139/57 heart rate 50 with respiratory to 23  with pulse oximetry of 87% on room air and 98% on 4 L of O2 by nasal cannula.  Labs revealed leukocytosis 16.5 with neutrophilia and hemoglobin of 8.1 with hematocrit 26.1 with platelets of 214.  Influenza antigens and COVID-19 PCR came back negative.  Blood cultures were drawn. EKG as reviewed by me : EKG showed sinus rhythm with rate of 81 with T wave inversion laterally. Imaging: Portable chest x-ray showed increasing vascular congestion without pulmonary edema.  Chest CTA revealed small pulmonary emboli within both lower lobes and the right upper lobe with no evidence for right heart strain.  It showed cardiomegaly and coronary artery disease with bibasal atelectasis and large masslike area in the left breast with surrounding soft tissue gas that corresponds with recent left lumpectomy.   The patient was given IV cefepime and heparin bolus and drip as well as 500 mL IV normal saline.  She will be admitted to a progressive unit bed for further evaluation and management.  As per Dr. Jimmye Norman  8/27-8/29/23: Pt presented w/ shortness of breath and was found to have acute pulmonary embolism w/o right heart strain. Pt was initially put on IV heparin drip and then changed to po eliquis. Echo is pending. Also, pt was admitted for mild breast cellulitis and has been on IV rocephin. Urine cx was growing staph epidermis and blood cxs growing stap captis for which pt has been on IV doxycycline. The blood cxs likely represent a containment. Repeat blood cxs NGTD. PT recs SNF.   8/31 LT breast US done.  9/1 s/p IR aspiration of Lt breast fluid collection   Cynthia Dean  TGG:[id number] DOB: 07/01/1951 DOA: 03/19/2022 PCP: Lowella Bandy, MD   Assessment & Plan:   Principal Problem:   Acute pulmonary embolism without acute cor pulmonale ([place]) Active Problems:   Sepsis due to cellulitis ([place])   ESRD on hemodialysis ([place])   Essential hypertension   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[place])   Dyslipidemia   Diabetic neuropathy (HCC)   Urinary tract infection   Anemia of chronic disease   Left breast abscess   Seroma of breast  Assessment and Plan: Acute pulmonary embolism: without acute cor pulmonale. D/c IV heparin and start eliquis.  8/30 aspirin Plavix on hold  Echo with EF 50 to 55%.  Grade 2 diastolic dysfunction.  RV normal function and size  Holding home dose of aspirin, plavix (was not on plavix per pharm but will ck again) 7/03 RV systolic function and size normal.  EF 50 to 55% 9/1 resume heparin gtt  when cleared to resume po a/c will start.     ACD: likely secondary to ESRD.  W/ possible component of acute blood loss anemia. S/p 1 unit of pRBCs transfused so far.  9/1 hg down to 7.2, spoke to pt and nephrology made aware. Pt agreeable to prbc transfusion , will give one unit in HD today.  No reports of bleed.       Sepsis: see Dr. Randel Books note on how pt met sepsis criteria. Likely secondary to left breast cellulitis. 8/31 on  Rocephin  9/1 resolved     Mild cellulitis of left breast:  On Rocephin...changed to cefazolin by Id 8/31 Lt breast US with seroma v.s. abscess. 9/1 s/p IR aspiration of hematoma. Cx sent  ID following Will consult surgery   Staph capitis bacteremia:  Repest ntd, but was on abx Likely from breast  ID following    Possible UTI: urine cx staph epidermidis.  Was treated with iv doxy. Now on iv cefazolin for above    ESRD: on HD MWF.  Nephrology following    Constipation: continue on colace, miralax   Breast cancer: recent dx. S/p surgical excision at [facility] & chemo. High risk of blood clots w/ hx of cancer    Diabetic neuropathy: continue on home dose of gabapentin    HLD: continue on statin    DM2: likely poorly controlled. HbA1c ordered for AM. Continue on 70/30, SSI w/ accuchecks    HTN: continue on metoprolol, losartan           DVT prophylaxis: Heparin drip Code Status:  full  Family Communication: Daughter at bedside Disposition Plan:   Level of care: Progressive  Status is: Inpatient Remains inpatient appropriate because: severity of illness, getting transfusion    Consultants:  Nephro   Procedures:  Antimicrobials: rocephin, doxycycline ..>cefazolin   Subjective: No shortness of breath or chest pain or dizziness  Objective: Vitals:   03/24/22 1951 03/24/22 2321 03/25/22 0342 03/25/22 0718  BP: (!) 119/50 (!) 146/58 (!) 161/69 (!) 173/73  Pulse: 70 65 64 68  Resp: '20 20 18 20  ' Temp: 98.3 F (36.8 C) 98.5 F (36.9 C) 98.1 F (36.7 C) 98.3 F (36.8 C)  TempSrc: Oral  Oral   SpO2: 100% 93% 100%   Weight:   118.5 kg   Height:        Intake/Output Summary (Last 24 hours) at 03/25/2022 0850 Last data filed at 03/25/2022 8676 Gross per 24 hour  Intake 357.96 ml  Output 0 ml  Net 357.96 ml   Filed Weights   03/22/22 0448 03/23/22 1814 03/25/22 0342  Weight: 116.3 kg 122.5 kg 118.5 kg    Examination: Calm, NAD Cta no  w/r Reg s1/s2 no gallop Left breast less swollen Soft benign +bs No edema Aaoxox3  Mood and affect appropriate in current setting     Data Reviewed: I have personally reviewed following labs and imaging studies  CBC: Recent Labs  Lab 03/19/22 2127 03/20/22 0448 03/20/22 1901 03/21/22 0655 03/21/22 1847 03/22/22 0156 03/23/22 0647 03/25/22 0523  WBC 16.5* 14.4*  --  9.6  --  9.7 10.4 10.9*  NEUTROABS 13.0*  --   --   --   --   --   --   --   HGB 8.1* 6.6*   < > 7.1* 7.5* 8.0* 7.5* 7.2*  HCT 26.1* 21.8*   < > 22.7* 23.8* 25.3* 24.3* 23.3*  MCV 90.6 93.2  --  87.3  --  86.6 87.7 89.6  PLT 214 163  --  168  --  210 235 255   < > = values in this interval not displayed.   Basic Metabolic Panel: Recent Labs  Lab 03/20/22 0448 03/21/22 0655 03/22/22 0156 03/23/22 0647 03/25/22 0523  NA 136 135 136 137 137  K 3.8 4.6 3.6 4.4 4.2  CL 109 101 98 101 101  CO2 21* '22 26 26 25  ' GLUCOSE 105* 84 91 101* 119*  BUN 47* 60* 32* 38* 27*  CREATININE 9.97* 12.47* 7.57* 9.31* 8.43*  CALCIUM 7.9* 8.7* 8.7* 9.5 9.4   GFR: Estimated Creatinine Clearance: 8.4 mL/min (A) (by C-G formula based on SCr of 8.43 mg/dL (H)). Liver Function Tests: Recent Labs  Lab 03/19/22 2127 03/25/22 0523  AST 15 14*  ALT 7 8  ALKPHOS 89 73  BILITOT 1.2 0.7  PROT 7.2 6.1*  ALBUMIN 3.4* 2.6*   No results for input(s): "LIPASE", "AMYLASE" in the last 168 hours. No results for input(s): "AMMONIA" in the last 168 hours. Coagulation Profile: Recent Labs  Lab 03/19/22 2127 03/24/22 1656  INR 1.2 1.7*   Cardiac Enzymes: No results for input(s): "CKTOTAL", "CKMB", "CKMBINDEX", "TROPONINI" in the last 168 hours. BNP (last 3 results) No results for input(s): "PROBNP" in the last 8760 hours. HbA1C: Recent Labs    03/23/22 0647  HGBA1C 5.8*   CBG: Recent Labs  Lab 03/24/22 0805 03/24/22 1149 03/24/22 1635 03/24/22 2153 03/25/22 0719  GLUCAP 129* 132* 157* 146* 129*   Lipid Profile: No  results for input(s): "CHOL", "HDL", "LDLCALC", "TRIG", "CHOLHDL", "LDLDIRECT" in the last 72 hours. Thyroid Function Tests: No results for input(s): "TSH", "T4TOTAL", "FREET4", "T3FREE", "THYROIDAB" in the last 72 hours. Anemia Panel: No results for input(s): "VITAMINB12", "FOLATE", "FERRITIN", "TIBC", "IRON", "RETICCTPCT" in the last 72 hours. Sepsis Labs: Recent Labs  Lab 03/19/22 2127 03/20/22 0026 03/20/22 0448  PROCALCITON  --   --  3.92  LATICACIDVEN 2.0* 1.1  --     Recent Results (from the past 240 hour(s))  Resp Panel by RT-PCR (Flu A&B, Covid) Anterior Nasal Swab     Status: None   Collection Time: 03/19/22  9:28 PM   Specimen: Anterior Nasal Swab  Result Value Ref Range Status   SARS Coronavirus 2 by RT PCR NEGATIVE NEGATIVE Final    Comment: (NOTE) SARS-CoV-2 target nucleic acids are NOT DETECTED.  The SARS-CoV-2 RNA is generally detectable in upper respiratory specimens during the acute phase of infection. The lowest concentration of SARS-CoV-2 viral copies this assay can detect is 138 copies/mL. A negative result does not preclude SARS-Cov-2 infection and should not be used as the sole basis for treatment or other patient management decisions. A negative result may occur with  improper specimen collection/handling, submission of specimen other than nasopharyngeal swab, presence of viral mutation(s) within the areas targeted by this assay, and inadequate number of viral copies(<138 copies/mL). A negative result must be combined with clinical observations, patient history, and epidemiological information. The expected result is Negative.  Fact Sheet for Patients:  EntrepreneurPulse.com.au  Fact Sheet for Healthcare Providers:  IncredibleEmployment.be  This test is no t yet approved or cleared by the [place] FDA and  has been authorized for detection and/or diagnosis of SARS-CoV-2 by FDA under an Emergency Use  Authorization (EUA). This EUA will remain  in effect (meaning this test can be used) for the duration of the COVID-19 declaration under Section 564(b)(1) of the Act, 21 U.S.C.section 360bbb-3(b)(1),  unless the authorization is terminated  or revoked sooner.       Influenza A by PCR NEGATIVE NEGATIVE Final   Influenza B by PCR NEGATIVE NEGATIVE Final    Comment: (NOTE) The Xpert Xpress SARS-CoV-2/FLU/RSV plus assay is intended as an aid in the diagnosis of influenza from Nasopharyngeal swab specimens and should not be used as a sole basis for treatment. Nasal washings and aspirates are unacceptable for Xpert Xpress SARS-CoV-2/FLU/RSV testing.  Fact Sheet for Patients: EntrepreneurPulse.com.au  Fact Sheet for Healthcare Providers: IncredibleEmployment.be  This test is not yet approved or cleared by the [place] FDA and has been authorized for detection and/or diagnosis of SARS-CoV-2 by FDA under an Emergency Use Authorization (EUA). This EUA will remain in effect (meaning this test can be used) for the duration of the COVID-19 declaration under Section 564(b)(1) of the Act, 21 U.S.C. section 360bbb-3(b)(1), unless the authorization is terminated or revoked.  Performed at [facility], [address] [place], [place] [address]   Blood Culture (routine x 2)     Status: Abnormal   Collection Time: 03/19/22  9:28 PM   Specimen: BLOOD  Result Value Ref Range Status   Specimen Description   Final    BLOOD RIGHT ANTECUBITAL Performed at [facility], [address] [place], [place] [address]    Special Requests   Final    BOTTLES DRAWN AEROBIC AND ANAEROBIC Blood Culture adequate volume Performed at [facility], [place], [place] [address]    Culture  Setup Time   Final    GRAM POSITIVE COCCI IN BOTH AEROBIC AND ANAEROBIC BOTTLES CRITICAL RESULT CALLED TO, READ BACK BY AND VERIFIED  WITH: CAROLYN CHILDS 03/20/22 1457 AMK Performed at [facility], [address] [place],  [address]    Culture STAPHYLOCOCCUS CAPITIS (A)  Final   Report Status 03/23/2022 FINAL  Final   Organism ID, Bacteria STAPHYLOCOCCUS CAPITIS  Final      Susceptibility   Staphylococcus capitis - MIC*    CIPROFLOXACIN <=0.5 SENSITIVE Sensitive     ERYTHROMYCIN <=0.25 SENSITIVE Sensitive     GENTAMICIN <=0.5 SENSITIVE Sensitive     OXACILLIN <=0.25 SENSITIVE Sensitive     TETRACYCLINE <=1 SENSITIVE Sensitive     VANCOMYCIN <=0.5 SENSITIVE Sensitive     TRIMETH/SULFA <=10 SENSITIVE Sensitive     CLINDAMYCIN <=0.25 SENSITIVE Sensitive     RIFAMPIN <=0.5 SENSITIVE Sensitive     Inducible Clindamycin NEGATIVE Sensitive     * STAPHYLOCOCCUS CAPITIS  Blood Culture ID Panel (Reflexed)     Status: Abnormal   Collection Time: 03/19/22  9:28 PM  Result Value Ref Range Status   Enterococcus faecalis NOT DETECTED NOT DETECTED Final   Enterococcus Faecium NOT DETECTED NOT DETECTED Final   Listeria monocytogenes NOT DETECTED NOT DETECTED Final   Staphylococcus species DETECTED (A) NOT DETECTED Final    Comment: CRITICAL RESULT CALLED TO, READ BACK BY AND VERIFIED WITH: CAROLYN CHILDS 03/20/22 1457 AMK    Staphylococcus aureus (BCID) NOT DETECTED NOT DETECTED Final   Staphylococcus epidermidis NOT DETECTED NOT DETECTED Final   Staphylococcus lugdunensis NOT DETECTED NOT DETECTED Final   Streptococcus species NOT DETECTED NOT DETECTED Final   Streptococcus agalactiae NOT DETECTED NOT DETECTED Final   Streptococcus pneumoniae NOT DETECTED NOT DETECTED Final   Streptococcus pyogenes NOT DETECTED NOT DETECTED Final   A.calcoaceticus-baumannii NOT DETECTED NOT DETECTED Final   Bacteroides fragilis NOT DETECTED NOT DETECTED Final   Enterobacterales  NOT DETECTED NOT DETECTED Final   Enterobacter cloacae complex NOT DETECTED NOT DETECTED Final   Escherichia coli NOT DETECTED NOT DETECTED Final    Klebsiella aerogenes NOT DETECTED NOT DETECTED Final   Klebsiella oxytoca NOT DETECTED NOT DETECTED Final   Klebsiella pneumoniae NOT DETECTED NOT DETECTED Final   Proteus species NOT DETECTED NOT DETECTED Final   Salmonella species NOT DETECTED NOT DETECTED Final   Serratia marcescens NOT DETECTED NOT DETECTED Final   Haemophilus influenzae NOT DETECTED NOT DETECTED Final   Neisseria meningitidis NOT DETECTED NOT DETECTED Final   Pseudomonas aeruginosa NOT DETECTED NOT DETECTED Final   Stenotrophomonas maltophilia NOT DETECTED NOT DETECTED Final   Candida albicans NOT DETECTED NOT DETECTED Final   Candida auris NOT DETECTED NOT DETECTED Final   Candida glabrata NOT DETECTED NOT DETECTED Final   Candida krusei NOT DETECTED NOT DETECTED Final   Candida parapsilosis NOT DETECTED NOT DETECTED Final   Candida tropicalis NOT DETECTED NOT DETECTED Final   Cryptococcus neoformans/gattii NOT DETECTED NOT DETECTED Final    Comment: Performed at [facility], [address] [place], [place] [address]  Blood Culture (routine x 2)     Status: Abnormal   Collection Time: 03/19/22 10:23 PM   Specimen: BLOOD  Result Value Ref Range Status   Specimen Description   Final    BLOOD BLOOD RIGHT FOREARM Performed at [facility], [address] [place], [place] [address]    Special Requests   Final    BOTTLES DRAWN AEROBIC AND ANAEROBIC Blood Culture adequate volume Performed at [facility], [address] [place], [place] [address]    Culture  Setup Time   Final    GRAM POSITIVE COCCI IN BOTH AEROBIC AND ANAEROBIC BOTTLES CRITICAL RESULT CALLED TO, READ BACK BY AND VERIFIED WITH: ELVERA MADUEME 03/20/22 1545 AMK Performed at [facility], [address] [place], [place] [address]    Culture (A)  Final    STAPHYLOCOCCUS CAPITIS SUSCEPTIBILITIES PERFORMED ON PREVIOUS CULTURE WITHIN THE LAST 5 DAYS. Performed at [facility], [place] [address],  [place], [place] [address]    Report Status 03/22/2022 FINAL  Final  Urine Culture     Status: Abnormal   Collection Time: 03/20/22  1:25 AM   Specimen: In/Out Cath Urine  Result Value Ref Range Status   Specimen Description   Final    IN/OUT CATH URINE Performed at [facility] [address] [place], [place] [address]    Special Requests   Final    NONE Performed at [facility], [place], [place] [address]    Culture 4,000 COLONIES/mL STAPHYLOCOCCUS EPIDERMIDIS (A)  Final   Report Status 03/22/2022 FINAL  Final   Organism ID, Bacteria STAPHYLOCOCCUS EPIDERMIDIS (A)  Final      Susceptibility   Staphylococcus epidermidis - MIC*    CIPROFLOXACIN <=0.5 SENSITIVE Sensitive     GENTAMICIN <=0.5 SENSITIVE Sensitive     NITROFURANTOIN <=16 SENSITIVE Sensitive     OXACILLIN <=0.25 SENSITIVE Sensitive     TETRACYCLINE <=1 SENSITIVE Sensitive     VANCOMYCIN 2 SENSITIVE Sensitive     TRIMETH/SULFA 80 RESISTANT Resistant     CLINDAMYCIN >=8 RESISTANT Resistant     RIFAMPIN <=0.5 SENSITIVE Sensitive     Inducible Clindamycin NEGATIVE Sensitive     * 4,000 COLONIES/mL STAPHYLOCOCCUS EPIDERMIDIS  Culture, blood (Routine X 2) w Reflex to ID Panel     Status: None (Preliminary  result)   Collection Time: 03/21/22 11:00 AM   Specimen: BLOOD  Result Value Ref Range Status   Specimen Description BLOOD BLOOD RIGHT FOREARM  Final   Special Requests   Final    BOTTLES DRAWN AEROBIC AND ANAEROBIC Blood Culture adequate volume   Culture   Final    NO GROWTH 4 DAYS Performed at [facility], [address], [place], [place] [address]    Report Status PENDING  Incomplete  Culture, blood (Routine X 2) w Reflex to ID Panel     Status: None (Preliminary result)   Collection Time: 03/22/22 11:53 AM   Specimen: BLOOD RIGHT HAND  Result Value Ref Range Status   Specimen Description BLOOD RIGHT HAND  Final   Special Requests   Final    BOTTLES DRAWN AEROBIC  AND ANAEROBIC Blood Culture adequate volume   Culture   Final    NO GROWTH 3 DAYS Performed at [facility], [address], [place], [place] [address]    Report Status PENDING  Incomplete         Radiology Studies: CT CHEST W CONTRAST  Result Date: 03/24/2022 CLINICAL DATA:  Breast cancer, status post recent left lumpectomy, cellulitis and seroma, rule out necrotizing infection * Tracking Code: BO * EXAM: CT CHEST WITH CONTRAST TECHNIQUE: Multidetector CT imaging of the chest was performed during intravenous contrast administration. RADIATION DOSE REDUCTION: This exam was performed according to the departmental dose-optimization program which includes automated exposure control, adjustment of the mA and/or kV according to patient size and/or use of iterative reconstruction technique. CONTRAST:  44m OMNIPAQUE IOHEXOL 300 MG/ML  SOLN COMPARISON:  CT chest angiogram, 03/19/2022, CT chest, 12/18/2013 FINDINGS: Cardiovascular: Right chest port catheter. Aortic atherosclerosis. Cardiomegaly. Three-vessel coronary artery calcifications. No pericardial effusion. Mediastinum/Nodes: Numerous prominent mediastinal and bilateral hilar lymph nodes unchanged compared to recent prior examination and very similar to remote prior examination dated 12/18/2013. Largest pretracheal node measures 2.3 x 1.7 cm (series 7, image 56). Thyroid gland, trachea, and esophagus demonstrate no significant findings. Lungs/Pleura: Innumerable small pulmonary nodules throughout the lungs, not changed from recent prior examination, although the great majority of which appear new compared to more remote prior examination dated 12/18/2013. Example nodule of the right apex measures 0.6 cm (series 3, image 32). Additional index nodule of the anterior left upper lobe measures 0.7 cm (series 3, image 48). Diffuse bilateral bronchial wall thickening and extensive interlobular septal thickening throughout, including non dependent  portions of the lungs. Generally bandlike scarring and or atelectasis of the bilateral lung bases. No pleural effusion or pneumothorax. Upper Abdomen: No acute abnormality. Small, benign left adrenal myelolipoma which no further specific follow-up or characterization required (series 2, image 138). Musculoskeletal: Large, partially imaged fluid collection within the left breast containing scattered internal air loculations with extensive adjacent fat stranding and overlying soft tissue thickening, although incompletely imaged due to large body habitus, measures at least 8.5 cm (series 7, image 92). Additional adjacent collection within the left axilla is fully imaged and measures 11.4 x 5.8 cm (series 7, image 42). No acute osseous findings. IMPRESSION: 1. Large, partially imaged fluid collection within the left breast containing scattered internal air loculations with extensive adjacent fat stranding and overlying soft tissue thickening, although incompletely imaged due to large body habitus, measures at least 8.5 cm. Persistent presence of air within this fluid collection is worrisome for gas-forming infection. 2. Additional adjacent collection within the left axilla is fully imaged and measures 11.4 x  5.8 cm without evidence of gas content and of simple fluid attenuation, most consistent with seroma. The presence or absence of infection within either of these fluid collections is not strictly established by CT. 3. Innumerable small pulmonary nodules throughout the lungs, not changed from recent prior examination, although the great majority of which appear new compared to more remote prior examination dated 12/18/2013. These are worrisome for pulmonary metastatic disease in the setting of breast cancer although potentially infectious or inflammatory. 4. Diffuse bilateral bronchial wall thickening and interlobular septal thickening throughout, possibly reflecting pulmonary edema, although interlobular septal  thickening is worrisome as a potential feature of lymphangitic metastatic disease in the setting of breast cancer. 5. Numerous prominent mediastinal and bilateral hilar lymph nodes unchanged compared to recent prior examination and very similar to remote prior examination dated 12/18/2013, most likely benign and reactive. 6. Cardiomegaly and coronary artery disease. Aortic Atherosclerosis (ICD10-I70.0). Electronically Signed   By: Delanna Ahmadi M.D.   On: 03/24/2022 16:48   US BREAST LTD UNI LEFT INC AXILLA  Result Date: 03/24/2022 CLINICAL DATA:  Mass of left breast, assess for abscess. Patient is status post left breast lumpectomy August 11th 2023. EXAM: ULTRASOUND OF THE LEFT BREAST COMPARISON:  Prior films FINDINGS: Targeted ultrasound is performed, showing complicated cystic lesion at the palpable area left breast 1-3 o'clock 12 cm from nipple measuring 14.5 x 6.3 x 9.2 cm. This may represent postsurgical seroma but abscess is not excluded based on ultrasound. IMPRESSION: Benign findings. Large mass at the left breast 1-3 o'clock 12 cm from nipple. This may represent postsurgical seroma but abscess is not excluded based on ultrasound. RECOMMENDATION: Management on clinical basis. I have discussed the findings and recommendations with the patient. If applicable, a reminder letter will be sent to the patient regarding the next appointment. BI-RADS CATEGORY  2: Benign. Electronically Signed   By: Abelardo Diesel M.D.   On: 03/24/2022 10:15        Scheduled Meds:  sodium chloride   Intravenous Once   atorvastatin  80 mg Oral QPM   calcium acetate  2,001 mg Oral TID WC   Chlorhexidine Gluconate Cloth  6 each Topical Q0600   vitamin D3  1,000 Units Oral Daily   cinacalcet  30 mg Oral Q breakfast   docusate sodium  200 mg Oral BID   epoetin (EPOGEN/PROCRIT) injection  10,000 Units Intravenous Q M,W,F-HD   fentaNYL       fluticasone  1 spray Each Nare Daily   gabapentin  100 mg Oral BID   insulin  aspart  0-6 Units Subcutaneous TID WC   insulin aspart protamine- aspart  10 Units Subcutaneous BID WC   levETIRAcetam  1,000 mg Oral Daily   levETIRAcetam  250 mg Oral Once per day on Mon Wed Fri   losartan  100 mg Oral QHS   metoprolol succinate  100 mg Oral Q breakfast   midazolam       pantoprazole  40 mg Oral Daily   polyethylene glycol  17 g Oral Daily   torsemide  100 mg Oral Q breakfast   Continuous Infusions:  sodium chloride 10 mL/hr at 03/25/22 2956    ceFAZolin (ANCEF) IV Stopped (03/24/22 1837)   heparin 1,500 Units/hr (03/25/22 0620)     LOS: 5 days    Time spent: 35 mins     Nolberto Hanlon, MD [facility] Pager [phone]  If 7PM-7AM, please contact night-coverage [email or web address] 03/25/2022, 8:50 AM

## 2022-03-26 DIAGNOSIS — I2699 Other pulmonary embolism without acute cor pulmonale: Secondary | ICD-10-CM | POA: Diagnosis not present

## 2022-03-26 DIAGNOSIS — A419 Sepsis, unspecified organism: Secondary | ICD-10-CM | POA: Diagnosis not present

## 2022-03-26 DIAGNOSIS — R7881 Bacteremia: Secondary | ICD-10-CM | POA: Diagnosis not present

## 2022-03-26 DIAGNOSIS — D638 Anemia in other chronic diseases classified elsewhere: Secondary | ICD-10-CM | POA: Diagnosis not present

## 2022-03-26 LAB — CBC
HCT: 26.6 % — ABNORMAL LOW (ref 36.0–46.0)
Hemoglobin: 8.3 g/dL — ABNORMAL LOW (ref 12.0–15.0)
MCH: 27.3 pg (ref 26.0–34.0)
MCHC: 31.2 g/dL (ref 30.0–36.0)
MCV: 87.5 fL (ref 80.0–100.0)
Platelets: 267 10*3/uL (ref 150–400)
RBC: 3.04 MIL/uL — ABNORMAL LOW (ref 3.87–5.11)
RDW: 17.4 % — ABNORMAL HIGH (ref 11.5–15.5)
WBC: 13.1 10*3/uL — ABNORMAL HIGH (ref 4.0–10.5)
nRBC: 0 % (ref 0.0–0.2)

## 2022-03-26 LAB — TYPE AND SCREEN
ABO/RH(D): A POS
Antibody Screen: NEGATIVE
Unit division: 0

## 2022-03-26 LAB — APTT: aPTT: 84 seconds — ABNORMAL HIGH (ref 24–36)

## 2022-03-26 LAB — BPAM RBC
Blood Product Expiration Date: 202309262359
ISSUE DATE / TIME: 202309011325
Unit Type and Rh: 6200

## 2022-03-26 LAB — CULTURE, BLOOD (ROUTINE X 2)
Culture: NO GROWTH
Special Requests: ADEQUATE

## 2022-03-26 LAB — GLUCOSE, CAPILLARY
Glucose-Capillary: 141 mg/dL — ABNORMAL HIGH (ref 70–99)
Glucose-Capillary: 130 mg/dL — ABNORMAL HIGH (ref 70–99)
Glucose-Capillary: 132 mg/dL — ABNORMAL HIGH (ref 70–99)
Glucose-Capillary: 107 mg/dL — ABNORMAL HIGH (ref 70–99)

## 2022-03-26 LAB — HEPARIN LEVEL (UNFRACTIONATED): Heparin Unfractionated: >1.1 IU/mL — ABNORMAL HIGH (ref 0.30–0.70)

## 2022-03-26 MED ORDER — CEFAZOLIN SODIUM 1 G IM
1.0000 g | INTRAMUSCULAR | Status: DC
Start: 2022-03-26 — End: 2022-03-27
  Administered 2022-03-26: 1 g via INTRAMUSCULAR
  Filled 2022-03-26 (×2): qty 1000

## 2022-03-26 NOTE — Progress Notes (Signed)
Subjective:  CC: Cynthia Dean is a 71 y.o. female  Hospital stay day 6,   left breast hematoma  HPI: No issues reported overnight.  No increasing pain or discharge from area.  ROS:  General: Denies weight loss, weight gain, fatigue, fevers, chills, and night sweats. Heart: Denies chest pain, palpitations, racing heart, irregular heartbeat, leg pain or swelling, and decreased activity tolerance. Respiratory: Denies breathing difficulty, shortness of breath, wheezing, cough, and sputum. GI: Denies change in appetite, heartburn, nausea, vomiting, constipation, diarrhea, and blood in stool. GU: Denies difficulty urinating, pain with urinating, urgency, frequency, blood in urine.   Objective:   Temp:  [98 F (36.7 C)-98.8 F (37.1 C)] 98.2 F (36.8 C) (09/02 0519) Pulse Rate:  [63-85] 65 (09/02 0519) Resp:  [13-31] 18 (09/02 0519) BP: (92-185)/(42-84) 151/68 (09/02 0519) SpO2:  [92 %-100 %] 97 % (09/02 0519) Weight:  [829 kg-116 kg] 115 kg (09/01 1621)     Height: '5\' 9"'$  (175.3 cm) Weight: 115 kg BMI (Calculated): 37.42   Intake/Output this shift:   Intake/Output Summary (Last 24 hours) at 03/26/2022 5621 Last data filed at 03/25/2022 1902 Gross per 24 hour  Intake 590 ml  Output 2000 ml  Net -1410 ml    Constitutional :  alert, cooperative, appears stated age, and no distress  Respiratory:  clear to auscultation bilaterally  Cardiovascular:  regular rate and rhythm  Gastrointestinal: soft, non-tender; bowel sounds normal; no masses,  no organomegaly.   Skin: Cool and moist. Left breast dressing intact, soft, no TTP. No obvious erythema or drainage around the dressing sites  Psychiatric: Normal affect, non-agitated, not confused       LABS:     Latest Ref Rng & Units 03/25/2022    5:23 AM 03/23/2022    6:47 AM 03/22/2022    1:56 AM  CMP  Glucose 70 - 99 mg/dL 119  101  91   BUN 8 - 23 mg/dL 27  38  32   Creatinine 0.44 - 1.00 mg/dL 8.43  9.31  7.57   Sodium 135 -  145 mmol/L 137  137  136   Potassium 3.5 - 5.1 mmol/L 4.2  4.4  3.6   Chloride 98 - 111 mmol/L 101  101  98   CO2 22 - 32 mmol/L '25  26  26   '$ Calcium 8.9 - 10.3 mg/dL 9.4  9.5  8.7   Total Protein 6.5 - 8.1 g/dL 6.1     Total Bilirubin 0.3 - 1.2 mg/dL 0.7     Alkaline Phos 38 - 126 U/L 73     AST 15 - 41 U/L 14     ALT 0 - 44 U/L 8         Latest Ref Rng & Units 03/25/2022    5:23 AM 03/23/2022    6:47 AM 03/22/2022    1:56 AM  CBC  WBC 4.0 - 10.5 K/uL 10.9  10.4  9.7   Hemoglobin 12.0 - 15.0 g/dL 7.2  7.5  8.0   Hematocrit 36.0 - 46.0 % 23.3  24.3  25.3   Platelets 150 - 400 K/uL 255  235  210     RADS: N/a Assessment:   Left breast hematoma, s/p IR aspiration.  No concerns for worsening issues on physical exam.  Pending cbc, but if stable, ok to resume oral anticoag to see make sure hematoma does not redevelop.    Further care per primary team  labs/images/medications/previous chart entries reviewed  personally and relevant changes/updates noted above.

## 2022-03-26 NOTE — Progress Notes (Signed)
PT Cancellation Note  Patient Details Name: Cynthia Dean MRN: [id number] DOB: Jan 24, 1951   Cancelled Treatment:    Reason Eval/Treat Not Completed: Patient declined, no reason specified Pt received in bed with multiple visitors present. Pt politely declined participation, stating "not today, maybe later". Will f/u as able.  Lavone Nian, PT, DPT 03/26/22, 2:05 PM   [name] 03/26/2022, 2:04 PM

## 2022-03-26 NOTE — Progress Notes (Signed)
A consult was placed to the IV Therapist to place a 2nd iv site due to incompatible medications;  limited to R arm only for all labs, ivs;  the R arm was assessed thoroughly w ultrasound;  attempted x 1 in the RUA but the pt pulled her arm away and yelled; was not able to get a 2nd iv site. No other healthy veins noted;  Pt says "I don't want to be stuck anymore; that's all they're doing to me here."  No further attempts made.

## 2022-03-26 NOTE — Progress Notes (Signed)
[facility]  ROUNDING NOTE   Subjective:   Cynthia Dean is a [age]-year-old female with past medical history including COPD, diabetes, carotid stenosis, hypertension, and end-stage renal disease on hemodialysis.  Patient presents to the emergency department with complaints of weakness, fatigue and fever.  Patient has been admitted for ESRD on hemodialysis ([place]) [N18.6, Z99.2] Acute pulmonary embolism without acute cor pulmonale ([place]) [I26.99] Sepsis with acute hypoxic respiratory failure and septic shock, due to unspecified organism ([place]) [A41.9, R65.21, J96.01]  Patient is known to our practice and receives outpatient dialysis treatments at [facility] on a TTS schedule, supervised by Dr. Candiss Norse.   Patient seen sitting up in bed, alert and oriented Patient was alittle unhappy regarding her access   Objective:  Vital signs in last 24 hours:  Temp:  [98 F (36.7 C)-98.8 F (37.1 C)] 98.4 F (36.9 C) (09/02 0750) Pulse Rate:  [63-85] 64 (09/02 0750) Resp:  [13-31] 17 (09/02 0750) BP: (92-153)/(42-84) 145/75 (09/02 0750) SpO2:  [95 %-100 %] 98 % (09/02 0750) Weight:  [388 kg-116 kg] 115 kg (09/01 1621)  Weight change: -2.5 kg Filed Weights   03/25/22 0342 03/25/22 1149 03/25/22 1621  Weight: 118.5 kg 116 kg 115 kg    Intake/Output: I/O last 3 completed shifts: In: 828 [P.O.:240; I.V.:238; Blood:350] Out: 2000 [Other:2000]   Intake/Output this shift:  No intake/output data recorded.  Physical Exam: General: NAD, resting comfortably  Head: Normocephalic, atraumatic. Moist oral mucosal membranes  Eyes: Anicteric  Lungs:  Clear to auscultation, normal effort  Heart: Regular rate and rhythm  Abdomen:  Soft, nontender, obese  Extremities: 1+ peripheral edema.  Neurologic: Alert, oriented to person  Skin: No lesions  Access: Left aVF    Basic Metabolic Panel: Recent Labs  Lab 03/20/22 0448 03/21/22 0655 03/22/22 0156 03/23/22 0647  03/25/22 0523  NA 136 135 136 137 137  K 3.8 4.6 3.6 4.4 4.2  CL 109 101 98 101 101  CO2 21* '22 26 26 25  '$ GLUCOSE 105* 84 91 101* 119*  BUN 47* 60* 32* 38* 27*  CREATININE 9.97* 12.47* 7.57* 9.31* 8.43*  CALCIUM 7.9* 8.7* 8.7* 9.5 9.4     Liver Function Tests: Recent Labs  Lab 03/19/22 2127 03/25/22 0523  AST 15 14*  ALT 7 8  ALKPHOS 89 73  BILITOT 1.2 0.7  PROT 7.2 6.1*  ALBUMIN 3.4* 2.6*    No results for input(s): "LIPASE", "AMYLASE" in the last 168 hours. No results for input(s): "AMMONIA" in the last 168 hours.  CBC: Recent Labs  Lab 03/19/22 2127 03/20/22 0448 03/21/22 0655 03/21/22 1847 03/22/22 0156 03/23/22 0647 03/25/22 0523 03/26/22 0714  WBC 16.5*   < > 9.6  --  9.7 10.4 10.9* 13.1*  NEUTROABS 13.0*  --   --   --   --   --   --   --   HGB 8.1*   < > 7.1* 7.5* 8.0* 7.5* 7.2* 8.3*  HCT 26.1*   < > 22.7* 23.8* 25.3* 24.3* 23.3* 26.6*  MCV 90.6   < > 87.3  --  86.6 87.7 89.6 87.5  PLT 214   < > 168  --  210 235 255 267   < > = values in this interval not displayed.     Cardiac Enzymes: No results for input(s): "CKTOTAL", "CKMB", "CKMBINDEX", "TROPONINI" in the last 168 hours.  BNP: Invalid input(s): "POCBNP"  CBG: Recent Labs  Lab 03/24/22 2153 03/25/22 0719  03/25/22 1114 03/25/22 1945 03/26/22 0755  GLUCAP 146* 129* 157* 102* 141*     Microbiology: Results for orders placed or performed during the hospital encounter of 03/19/22  Resp Panel by RT-PCR (Flu A&B, Covid) Anterior Nasal Swab     Status: None   Collection Time: 03/19/22  9:28 PM   Specimen: Anterior Nasal Swab  Result Value Ref Range Status   SARS Coronavirus 2 by RT PCR NEGATIVE NEGATIVE Final    Comment: (NOTE) SARS-CoV-2 target nucleic acids are NOT DETECTED.  The SARS-CoV-2 RNA is generally detectable in upper respiratory specimens during the acute phase of infection. The lowest concentration of SARS-CoV-2 viral copies this assay can detect is 138 copies/mL. A  negative result does not preclude SARS-Cov-2 infection and should not be used as the sole basis for treatment or other patient management decisions. A negative result may occur with  improper specimen collection/handling, submission of specimen other than nasopharyngeal swab, presence of viral mutation(s) within the areas targeted by this assay, and inadequate number of viral copies(<138 copies/mL). A negative result must be combined with clinical observations, patient history, and epidemiological information. The expected result is Negative.  Fact Sheet for Patients:  EntrepreneurPulse.com.au  Fact Sheet for Healthcare Providers:  IncredibleEmployment.be  This test is no t yet approved or cleared by the [place] FDA and  has been authorized for detection and/or diagnosis of SARS-CoV-2 by FDA under an Emergency Use Authorization (EUA). This EUA will remain  in effect (meaning this test can be used) for the duration of the COVID-19 declaration under Section 564(b)(1) of the Act, 21 U.S.C.section 360bbb-3(b)(1), unless the authorization is terminated  or revoked sooner.       Influenza A by PCR NEGATIVE NEGATIVE Final   Influenza B by PCR NEGATIVE NEGATIVE Final    Comment: (NOTE) The Xpert Xpress SARS-CoV-2/FLU/RSV plus assay is intended as an aid in the diagnosis of influenza from Nasopharyngeal swab specimens and should not be used as a sole basis for treatment. Nasal washings and aspirates are unacceptable for Xpert Xpress SARS-CoV-2/FLU/RSV testing.  Fact Sheet for Patients: EntrepreneurPulse.com.au  Fact Sheet for Healthcare Providers: IncredibleEmployment.be  This test is not yet approved or cleared by the [place] FDA and has been authorized for detection and/or diagnosis of SARS-CoV-2 by FDA under an Emergency Use Authorization (EUA). This EUA will remain in effect (meaning this test can  be used) for the duration of the COVID-19 declaration under Section 564(b)(1) of the Act, 21 U.S.C. section 360bbb-3(b)(1), unless the authorization is terminated or revoked.  Performed at [facility], [address] [place], [place] [address]   Blood Culture (routine x 2)     Status: Abnormal   Collection Time: 03/19/22  9:28 PM   Specimen: BLOOD  Result Value Ref Range Status   Specimen Description   Final    BLOOD RIGHT ANTECUBITAL Performed at [facility], [address] [place], [place] [address]    Special Requests   Final    BOTTLES DRAWN AEROBIC AND ANAEROBIC Blood Culture adequate volume Performed at [facility], [address] [place], [place] [address]    Culture  Setup Time   Final    GRAM POSITIVE COCCI IN BOTH AEROBIC AND ANAEROBIC BOTTLES CRITICAL RESULT CALLED TO, READ BACK BY AND VERIFIED WITH: CAROLYN CHILDS 03/20/22 1457 AMK Performed at [facility], [address] [place], [place] [address]    Culture STAPHYLOCOCCUS CAPITIS (A)  Final   Report  Status 03/23/2022 FINAL  Final   Organism ID, Bacteria STAPHYLOCOCCUS CAPITIS  Final      Susceptibility   Staphylococcus capitis - MIC*    CIPROFLOXACIN <=0.5 SENSITIVE Sensitive     ERYTHROMYCIN <=0.25 SENSITIVE Sensitive     GENTAMICIN <=0.5 SENSITIVE Sensitive     OXACILLIN <=0.25 SENSITIVE Sensitive     TETRACYCLINE <=1 SENSITIVE Sensitive     VANCOMYCIN <=0.5 SENSITIVE Sensitive     TRIMETH/SULFA <=10 SENSITIVE Sensitive     CLINDAMYCIN <=0.25 SENSITIVE Sensitive     RIFAMPIN <=0.5 SENSITIVE Sensitive     Inducible Clindamycin NEGATIVE Sensitive     * STAPHYLOCOCCUS CAPITIS  Blood Culture ID Panel (Reflexed)     Status: Abnormal   Collection Time: 03/19/22  9:28 PM  Result Value Ref Range Status   Enterococcus faecalis NOT DETECTED NOT DETECTED Final   Enterococcus Faecium NOT DETECTED NOT DETECTED Final   Listeria monocytogenes NOT DETECTED NOT DETECTED  Final   Staphylococcus species DETECTED (A) NOT DETECTED Final    Comment: CRITICAL RESULT CALLED TO, READ BACK BY AND VERIFIED WITH: CAROLYN CHILDS 03/20/22 1457 AMK    Staphylococcus aureus (BCID) NOT DETECTED NOT DETECTED Final   Staphylococcus epidermidis NOT DETECTED NOT DETECTED Final   Staphylococcus lugdunensis NOT DETECTED NOT DETECTED Final   Streptococcus species NOT DETECTED NOT DETECTED Final   Streptococcus agalactiae NOT DETECTED NOT DETECTED Final   Streptococcus pneumoniae NOT DETECTED NOT DETECTED Final   Streptococcus pyogenes NOT DETECTED NOT DETECTED Final   A.calcoaceticus-baumannii NOT DETECTED NOT DETECTED Final   Bacteroides fragilis NOT DETECTED NOT DETECTED Final   Enterobacterales NOT DETECTED NOT DETECTED Final   Enterobacter cloacae complex NOT DETECTED NOT DETECTED Final   Escherichia coli NOT DETECTED NOT DETECTED Final   Klebsiella aerogenes NOT DETECTED NOT DETECTED Final   Klebsiella oxytoca NOT DETECTED NOT DETECTED Final   Klebsiella pneumoniae NOT DETECTED NOT DETECTED Final   Proteus species NOT DETECTED NOT DETECTED Final   Salmonella species NOT DETECTED NOT DETECTED Final   Serratia marcescens NOT DETECTED NOT DETECTED Final   Haemophilus influenzae NOT DETECTED NOT DETECTED Final   Neisseria meningitidis NOT DETECTED NOT DETECTED Final   Pseudomonas aeruginosa NOT DETECTED NOT DETECTED Final   Stenotrophomonas maltophilia NOT DETECTED NOT DETECTED Final   Candida albicans NOT DETECTED NOT DETECTED Final   Candida auris NOT DETECTED NOT DETECTED Final   Candida glabrata NOT DETECTED NOT DETECTED Final   Candida krusei NOT DETECTED NOT DETECTED Final   Candida parapsilosis NOT DETECTED NOT DETECTED Final   Candida tropicalis NOT DETECTED NOT DETECTED Final   Cryptococcus neoformans/gattii NOT DETECTED NOT DETECTED Final    Comment: Performed at [facility], [address] [place], [place] [address]  Blood Culture (routine x 2)      Status: Abnormal   Collection Time: 03/19/22 10:23 PM   Specimen: BLOOD  Result Value Ref Range Status   Specimen Description   Final    BLOOD BLOOD RIGHT FOREARM Performed at [facility], [address], [place], [place] [address]    Special Requests   Final    BOTTLES DRAWN AEROBIC AND ANAEROBIC Blood Culture adequate volume Performed at [facility], [address] [place], [place] [address]    Culture  Setup Time   Final    GRAM POSITIVE COCCI IN BOTH AEROBIC AND ANAEROBIC BOTTLES CRITICAL RESULT CALLED TO, READ BACK BY AND VERIFIED WITH: ELVERA MADUEME 03/20/22 1545 AMK Performed at [employer]  [facility], [place], [place] [address]    Culture (A)  Final    STAPHYLOCOCCUS CAPITIS SUSCEPTIBILITIES PERFORMED ON PREVIOUS CULTURE WITHIN THE LAST 5 DAYS. Performed at [facility], [place] [address], [place], [place] [address]    Report Status 03/22/2022 FINAL  Final  Urine Culture     Status: Abnormal   Collection Time: 03/20/22  1:25 AM   Specimen: In/Out Cath Urine  Result Value Ref Range Status   Specimen Description   Final    IN/OUT CATH URINE Performed at [facility], [address] [place], [place] [address]    Special Requests   Final    NONE Performed at [facility], [address] [place], [place] [address]    Culture 4,000 COLONIES/mL STAPHYLOCOCCUS EPIDERMIDIS (A)  Final   Report Status 03/22/2022 FINAL  Final   Organism ID, Bacteria STAPHYLOCOCCUS EPIDERMIDIS (A)  Final      Susceptibility   Staphylococcus epidermidis - MIC*    CIPROFLOXACIN <=0.5 SENSITIVE Sensitive     GENTAMICIN <=0.5 SENSITIVE Sensitive     NITROFURANTOIN <=16 SENSITIVE Sensitive     OXACILLIN <=0.25 SENSITIVE Sensitive     TETRACYCLINE <=1 SENSITIVE Sensitive     VANCOMYCIN 2 SENSITIVE Sensitive     TRIMETH/SULFA 80 RESISTANT Resistant     CLINDAMYCIN >=8 RESISTANT Resistant     RIFAMPIN <=0.5 SENSITIVE Sensitive      Inducible Clindamycin NEGATIVE Sensitive     * 4,000 COLONIES/mL STAPHYLOCOCCUS EPIDERMIDIS  Culture, blood (Routine X 2) w Reflex to ID Panel     Status: None   Collection Time: 03/21/22 11:00 AM   Specimen: BLOOD  Result Value Ref Range Status   Specimen Description BLOOD BLOOD RIGHT FOREARM  Final   Special Requests   Final    BOTTLES DRAWN AEROBIC AND ANAEROBIC Blood Culture adequate volume   Culture   Final    NO GROWTH 5 DAYS Performed at [facility], [address] [place], [place] [address]    Report Status 03/26/2022 FINAL  Final  Culture, blood (Routine X 2) w Reflex to ID Panel     Status: None (Preliminary result)   Collection Time: 03/22/22 11:53 AM   Specimen: BLOOD RIGHT HAND  Result Value Ref Range Status   Specimen Description BLOOD RIGHT HAND  Final   Special Requests   Final    BOTTLES DRAWN AEROBIC AND ANAEROBIC Blood Culture adequate volume   Culture   Final    NO GROWTH 4 DAYS Performed at [facility], [address] [place], [place] [address]    Report Status PENDING  Incomplete  Aerobic/Anaerobic Culture w Gram Stain (surgical/deep wound)     Status: None (Preliminary result)   Collection Time: 03/25/22 10:30 AM   Specimen: Wound; Blood  Result Value Ref Range Status   Specimen Description WOUND LEFT BREAST  Final   Special Requests BLOOD HEMATOMA  Final   Gram Stain   Final    RARE WBC PRESENT, PREDOMINANTLY MONONUCLEAR NO ORGANISMS SEEN    Culture   Final    NO GROWTH < 24 HOURS Performed at [address] [address], [place], [place] [address]    Report Status PENDING  Incomplete    Coagulation Studies: Recent Labs    03/24/22 1656  LABPROT 19.6*  INR 1.7*     Urinalysis: No results for input(s): "COLORURINE", "LABSPEC", "PHURINE", "GLUCOSEU", "HGBUR", "BILIRUBINUR", "KETONESUR", "PROTEINUR", "UROBILINOGEN", "NITRITE", "LEUKOCYTESUR"  in the last 72 hours.  Invalid input(s): "APPERANCEUR"      Imaging: IR US Guide Bx Asp/Drain  Result Date: 03/25/2022 INDICATION: 71 year old female with complex left breast fluid collection status post surgical intervention. She presents for aspiration and possible drain placement. EXAM: Ultrasound-guided aspiration MEDICATIONS: None ANESTHESIA/SEDATION: None COMPLICATIONS: None immediate. PROCEDURE: Informed written consent was obtained from the patient after a thorough discussion of the procedural risks, benefits and alternatives. All questions were addressed. Maximal Sterile Barrier Technique was utilized including caps, mask, sterile gowns, sterile gloves, sterile drape, hand hygiene and skin antiseptic. A timeout was performed prior to the initiation of the procedure. The left breast was interrogated with ultrasound. There is a large amorphous lace-like complex fluid collection subjacent to the surgical incision site. Sonographically, the collection appears most consistent with hematoma. A suitable skin entry site was selected and marked. Local anesthesia was attained by infiltration with 1% lidocaine. A small dermatotomy was made. Under real-time ultrasound guidance, a 15 gauge trocar needle was advanced into the fluid collection. Aspiration was then performed yielding approximately 135 mL of old blood. The majority of the liquid component of the collection was successfully aspirated. The remaining collection is too thick/gelatinous for simple aspiration. Samples were sent for Gram stain and culture to exclude superinfection of the hematoma. The patient tolerated the procedure well. IMPRESSION: Successful partial aspiration of large left breast hematoma. The 135 mL liquid component of the hematoma was successfully aspirated. A sample was sent for Gram stain and culture. Electronically Signed   By: Jacqulynn Cadet M.D.   On: 03/25/2022 15:08   CT CHEST W CONTRAST  Result Date: 03/24/2022 CLINICAL DATA:  Breast cancer, status post recent left lumpectomy,  cellulitis and seroma, rule out necrotizing infection * Tracking Code: BO * EXAM: CT CHEST WITH CONTRAST TECHNIQUE: Multidetector CT imaging of the chest was performed during intravenous contrast administration. RADIATION DOSE REDUCTION: This exam was performed according to the departmental dose-optimization program which includes automated exposure control, adjustment of the mA and/or kV according to patient size and/or use of iterative reconstruction technique. CONTRAST:  69m OMNIPAQUE IOHEXOL 300 MG/ML  SOLN COMPARISON:  CT chest angiogram, 03/19/2022, CT chest, 12/18/2013 FINDINGS: Cardiovascular: Right chest port catheter. Aortic atherosclerosis. Cardiomegaly. Three-vessel coronary artery calcifications. No pericardial effusion. Mediastinum/Nodes: Numerous prominent mediastinal and bilateral hilar lymph nodes unchanged compared to recent prior examination and very similar to remote prior examination dated 12/18/2013. Largest pretracheal node measures 2.3 x 1.7 cm (series 7, image 56). Thyroid gland, trachea, and esophagus demonstrate no significant findings. Lungs/Pleura: Innumerable small pulmonary nodules throughout the lungs, not changed from recent prior examination, although the great majority of which appear new compared to more remote prior examination dated 12/18/2013. Example nodule of the right apex measures 0.6 cm (series 3, image 32). Additional index nodule of the anterior left upper lobe measures 0.7 cm (series 3, image 48). Diffuse bilateral bronchial wall thickening and extensive interlobular septal thickening throughout, including non dependent portions of the lungs. Generally bandlike scarring and or atelectasis of the bilateral lung bases. No pleural effusion or pneumothorax. Upper Abdomen: No acute abnormality. Small, benign left adrenal myelolipoma which no further specific follow-up or characterization required (series 2, image 138). Musculoskeletal: Large, partially imaged fluid  collection within the left breast containing scattered internal air loculations with extensive adjacent fat stranding and overlying soft tissue thickening, although incompletely imaged due to large body habitus, measures at least 8.5 cm (series 7, image 92). Additional adjacent collection within the  left axilla is fully imaged and measures 11.4 x 5.8 cm (series 7, image 42). No acute osseous findings. IMPRESSION: 1. Large, partially imaged fluid collection within the left breast containing scattered internal air loculations with extensive adjacent fat stranding and overlying soft tissue thickening, although incompletely imaged due to large body habitus, measures at least 8.5 cm. Persistent presence of air within this fluid collection is worrisome for gas-forming infection. 2. Additional adjacent collection within the left axilla is fully imaged and measures 11.4 x 5.8 cm without evidence of gas content and of simple fluid attenuation, most consistent with seroma. The presence or absence of infection within either of these fluid collections is not strictly established by CT. 3. Innumerable small pulmonary nodules throughout the lungs, not changed from recent prior examination, although the great majority of which appear new compared to more remote prior examination dated 12/18/2013. These are worrisome for pulmonary metastatic disease in the setting of breast cancer although potentially infectious or inflammatory. 4. Diffuse bilateral bronchial wall thickening and interlobular septal thickening throughout, possibly reflecting pulmonary edema, although interlobular septal thickening is worrisome as a potential feature of lymphangitic metastatic disease in the setting of breast cancer. 5. Numerous prominent mediastinal and bilateral hilar lymph nodes unchanged compared to recent prior examination and very similar to remote prior examination dated 12/18/2013, most likely benign and reactive. 6. Cardiomegaly and coronary  artery disease. Aortic Atherosclerosis (ICD10-I70.0). Electronically Signed   By: Delanna Ahmadi M.D.   On: 03/24/2022 16:48     Medications:    sodium chloride 10 mL/hr at 03/25/22 9924    ceFAZolin (ANCEF) IV 1 g (03/25/22 1954)   heparin 1,500 Units/hr (03/26/22 0952)    sodium chloride   Intravenous Once   atorvastatin  80 mg Oral QPM   calcium acetate  2,001 mg Oral TID WC   Chlorhexidine Gluconate Cloth  6 each Topical Q0600   vitamin D3  1,000 Units Oral Daily   cinacalcet  30 mg Oral Q breakfast   docusate sodium  200 mg Oral BID   epoetin (EPOGEN/PROCRIT) injection  10,000 Units Intravenous Q M,W,F-HD   fluticasone  1 spray Each Nare Daily   gabapentin  100 mg Oral BID   insulin aspart  0-6 Units Subcutaneous TID WC   insulin aspart protamine- aspart  10 Units Subcutaneous BID WC   levETIRAcetam  1,000 mg Oral Daily   levETIRAcetam  250 mg Oral Once per day on Mon Wed Fri   losartan  100 mg Oral QHS   metoprolol succinate  100 mg Oral Q breakfast   pantoprazole  40 mg Oral Daily   polyethylene glycol  17 g Oral Daily   torsemide  100 mg Oral Q breakfast   sodium chloride, acetaminophen **OR** acetaminophen, diphenoxylate-atropine, glucose, ipratropium-albuterol, lidocaine-prilocaine, magnesium hydroxide, metoCLOPramide (REGLAN) injection, oxyCODONE, traZODone  Assessment/ Plan:  Cynthia Dean is a 71 y.o.  female ast medical history including COPD, diabetes, carotid stenosis, hypertension, and end-stage renal disease on hemodialysis.  Patient presents to the emergency department with complaints of weakness, fatigue and fever.  Patient has been admitted for ESRD on hemodialysis ([place]) [N18.6, Z99.2] Acute pulmonary embolism without acute cor pulmonale ([place]) [I26.99] Sepsis with acute hypoxic respiratory failure and septic shock, due to unspecified organism ([place]) [A41.9, R65.21, J96.01]  [facility]/MWF/left aVF  End-stage renal disease on  hemodialysis.  Will maintain outpatient schedule if possible.  [name]  Next treatment scheduled for Monday. No acute indication for HD  today.   2. Anemia of chronic kidney disease Lab Results  Component Value Date   HGB 8.3 (L) 03/26/2022    Patient receives [place] outpatient.  Hemoglobin continues to decrease.  Primary team has ordered 1 unit blood transfusion to be given during dialysis.  We will also continue EPO with treatments.  3. Secondary Hyperparathyroidism: with outpatient labs: PTH 613, phosphorus 4.8, calcium 9.0 on 03/07/2022.   Lab Results  Component Value Date   PTH 131 (H) 03/14/2018   CALCIUM 9.4 03/25/2022   PHOS 3.0 10/25/2019  Bone minerals remain within acceptable range. Continue calcium acetate with meals.  4. Diabetes mellitus type II with chronic kidney disease: insulin dependent. Home regimen includes Novolin. Most recent hemoglobin A1c is 7.5 on 01/29/20.  Glucose appears stable.  Primary team to manage.  5.  Hypertension with chronic kidney disease.  Home regimen includes losartan, metoprolol, and torsemide.  Patient currently receiving these medications.  Blood pressure 145/75   LOS: [place] 9/2/202310:42 AM

## 2022-03-26 NOTE — Progress Notes (Signed)
PROGRESS NOTE   HPI was taken from Dr. Sidney Dean: Cynthia Dean is a 71 y.o. African-American female with medical history significant for COPD, carotid stenosis, type 2 diabetes mellitus, end-stage renal disease on hemodialysis on MWF, hypertension and dyslipidemia, as well as invasive ductal carcinoma of the left breast status post surgical excision at [facility] recently, who presented to the emergency room with acute onset of generalized weakness and fatigue for the last couple of days.  She missed hemodialysis session yesterday.  She was fairly confused in the ER.  She has been having dyspnea with mild cough and occasional wheezing.  She admitted to fever and chills.  No chest pain or palpitations.  No nausea or vomiting or diarrhea or abdominal pain.  No bleeding diathesis.   ED Course: Upon presentation to the emergency room temperature was 102.9 with BP 139/57 heart rate 50 with respiratory to 23  with pulse oximetry of 87% on room air and 98% on 4 L of O2 by nasal cannula.  Labs revealed leukocytosis 16.5 with neutrophilia and hemoglobin of 8.1 with hematocrit 26.1 with platelets of 214.  Influenza antigens and COVID-19 PCR came back negative.  Blood cultures were drawn. EKG as reviewed by me : EKG showed sinus rhythm with rate of 81 with T wave inversion laterally. Imaging: Portable chest x-ray showed increasing vascular congestion without pulmonary edema.  Chest CTA revealed small pulmonary emboli within both lower lobes and the right upper lobe with no evidence for right heart strain.  It showed cardiomegaly and coronary artery disease with bibasal atelectasis and large masslike area in the left breast with surrounding soft tissue gas that corresponds with recent left lumpectomy.   The patient was given IV cefepime and heparin bolus and drip as well as 500 mL IV normal saline.  She will be admitted to a progressive unit bed for further evaluation and management.  As per Dr. Jimmye Dean  8/27-8/29/23: Pt presented w/ shortness of breath and was found to have acute pulmonary embolism w/o right heart strain. Pt was initially put on IV heparin drip and then changed to po eliquis. Echo is pending. Also, pt was admitted for mild breast cellulitis and has been on IV rocephin. Urine cx was growing staph epidermis and blood cxs growing stap captis for which pt has been on IV doxycycline. The blood cxs likely represent a containment. Repeat blood cxs NGTD. PT recs SNF.   8/31 LT breast US done.  9/1 s/p IR aspiration of Lt breast fluid collection 9/2 WBC up.  Spoke to Dr. Lysle Dean recommended continuing heparin drip just in case she needs I&D   [name]  MLY:[id number] DOB: 1951/03/23 DOA: 03/19/2022 PCP: Cynthia Bandy, MD   Assessment & Plan:   Principal Problem:   Acute pulmonary embolism without acute cor pulmonale ([place]) Active Problems:   Sepsis due to cellulitis ([place])   ESRD on hemodialysis ([place])   Essential hypertension   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[place])   Dyslipidemia   Diabetic neuropathy ([place])   Urinary tract infection   Anemia of chronic disease   Left breast abscess   Seroma of breast   Bacteremia due to coagulase-negative Staphylococcus  Assessment and Plan: Acute pulmonary embolism: without acute cor pulmonale. D/c IV heparin and start eliquis.  8/30 aspirin Plavix on hold  Echo with EF 50 to 55%.  Grade 2 diastolic dysfunction.  RV normal function and size  Holding home dose of aspirin, plavix (was  not on plavix per pharm but will ck again) 7/12 RV systolic function and size normal.  EF 50 to 55% 9/2 continue heparin drip until cleared by surgery       ACD: likely secondary to ESRD.  W/ possible component of acute blood loss anemia. S/p 1 unit of pRBCs transfused so far.  9/1 hg down to 7.2, spoke to pt and nephrology made aware. Pt agreeable to prbc transfusion , will give one unit in HD today.  No reports  of bleed. 9/2 h/h stable , s/p transfusion 1 unit prbc in HD on 9/1 monitor       Sepsis: see Dr. Randel Dean note on how pt met sepsis criteria. Likely secondary to left breast cellulitis. 8/31 on Rocephin  9/2 resolved     Mild cellulitis of left breast:  On Rocephin...changed to cefazolin by Id 8/31 Lt breast US with seroma v.s. abscess. 9/1 s/p IR aspiration of hematoma. Cx sent 9/2 gsx following. Wbc up . Gsx rec. Continue heparin gtt incase needs I/d.    Staph capitis bacteremia:  Likely from breast  ID following 9/2 repeat cultures negative to date   Possible UTI: urine cx staph epidermidis.  Was treated with iv doxy. Now on iv cefazolin for above    ESRD: on HD MWF.  Nephrology following    Constipation: continue on colace, miralax   Breast cancer: recent dx. S/p surgical excision at [facility] & chemo. High risk of blood clots w/ hx of cancer    Diabetic neuropathy: continue on home dose of gabapentin    HLD: continue on statin    DM2: likely poorly controlled. HbA1c ordered for AM. Continue on 70/30, SSI w/ accuchecks    HTN: continue on metoprolol, losartan           DVT prophylaxis: Heparin drip Code Status:  full  Family Communication: None at bedside Disposition Plan:   Level of care: Progressive  Status is: Inpatient Remains inpatient appropriate because: severity of illness, getting transfusion    Consultants:  Nephro   Procedures:  Antimicrobials: rocephin, doxycycline ..>cefazolin   Subjective: Has no shortness of breath or chest pain.  No dizziness.  Objective: Vitals:   03/25/22 1949 03/25/22 2356 03/26/22 0519 03/26/22 0750  BP: (!) 153/84 (!) 149/60 (!) 151/68 (!) 145/75  Pulse: 85 75 65 64  Resp: '18 18 18 17  ' Temp: 98.8 F (37.1 C) 98.8 F (37.1 C) 98.2 F (36.8 C) 98.4 F (36.9 C)  TempSrc: Oral Oral    SpO2: 95% 100% 97% 98%  Weight:      Height:        Intake/Output Summary (Last 24 hours) at 03/26/2022  0846 Last data filed at 03/25/2022 1902 Gross per 24 hour  Intake 590 ml  Output 2000 ml  Net -1410 ml   Filed Weights   03/25/22 0342 03/25/22 1149 03/25/22 1621  Weight: 118.5 kg 116 kg 115 kg    Examination: Calm, NAD Cta no w/r Reg s1/s2 no gallop Soft benign +bs No edema Grossly intact awake and alert Mood and affect appropriate in current setting     Data Reviewed: I have personally reviewed following labs and imaging studies  CBC: Recent Labs  Lab 03/19/22 2127 03/20/22 0448 03/21/22 0655 03/21/22 1847 03/22/22 0156 03/23/22 0647 03/25/22 0523 03/26/22 0714  WBC 16.5*   < > 9.6  --  9.7 10.4 10.9* 13.1*  NEUTROABS 13.0*  --   --   --   --   --   --   --  HGB 8.1*   < > 7.1* 7.5* 8.0* 7.5* 7.2* 8.3*  HCT 26.1*   < > 22.7* 23.8* 25.3* 24.3* 23.3* 26.6*  MCV 90.6   < > 87.3  --  86.6 87.7 89.6 87.5  PLT 214   < > 168  --  210 235 255 267   < > = values in this interval not displayed.   Basic Metabolic Panel: Recent Labs  Lab 03/20/22 0448 03/21/22 0655 03/22/22 0156 03/23/22 0647 03/25/22 0523  NA 136 135 136 137 137  K 3.8 4.6 3.6 4.4 4.2  CL 109 101 98 101 101  CO2 21* '22 26 26 25  ' GLUCOSE 105* 84 91 101* 119*  BUN 47* 60* 32* 38* 27*  CREATININE 9.97* 12.47* 7.57* 9.31* 8.43*  CALCIUM 7.9* 8.7* 8.7* 9.5 9.4   GFR: Estimated Creatinine Clearance: 8.3 mL/min (A) (by C-G formula based on SCr of 8.43 mg/dL (H)). Liver Function Tests: Recent Labs  Lab 03/19/22 2127 03/25/22 0523  AST 15 14*  ALT 7 8  ALKPHOS 89 73  BILITOT 1.2 0.7  PROT 7.2 6.1*  ALBUMIN 3.4* 2.6*   No results for input(s): "LIPASE", "AMYLASE" in the last 168 hours. No results for input(s): "AMMONIA" in the last 168 hours. Coagulation Profile: Recent Labs  Lab 03/19/22 2127 03/24/22 1656  INR 1.2 1.7*   Cardiac Enzymes: No results for input(s): "CKTOTAL", "CKMB", "CKMBINDEX", "TROPONINI" in the last 168 hours. BNP (last 3 results) No results for input(s):  "PROBNP" in the last 8760 hours. HbA1C: No results for input(s): "HGBA1C" in the last 72 hours.  CBG: Recent Labs  Lab 03/24/22 2153 03/25/22 0719 03/25/22 1114 03/25/22 1945 03/26/22 0755  GLUCAP 146* 129* 157* 102* 141*   Lipid Profile: No results for input(s): "CHOL", "HDL", "LDLCALC", "TRIG", "CHOLHDL", "LDLDIRECT" in the last 72 hours. Thyroid Function Tests: No results for input(s): "TSH", "T4TOTAL", "FREET4", "T3FREE", "THYROIDAB" in the last 72 hours. Anemia Panel: No results for input(s): "VITAMINB12", "FOLATE", "FERRITIN", "TIBC", "IRON", "RETICCTPCT" in the last 72 hours. Sepsis Labs: Recent Labs  Lab 03/19/22 2127 03/20/22 0026 03/20/22 0448  PROCALCITON  --   --  3.92  LATICACIDVEN 2.0* 1.1  --     Recent Results (from the past 240 hour(s))  Resp Panel by RT-PCR (Flu A&B, Covid) Anterior Nasal Swab     Status: None   Collection Time: 03/19/22  9:28 PM   Specimen: Anterior Nasal Swab  Result Value Ref Range Status   SARS Coronavirus 2 by RT PCR NEGATIVE NEGATIVE Final    Comment: (NOTE) SARS-CoV-2 target nucleic acids are NOT DETECTED.  The SARS-CoV-2 RNA is generally detectable in upper respiratory specimens during the acute phase of infection. The lowest concentration of SARS-CoV-2 viral copies this assay can detect is 138 copies/mL. A negative result does not preclude SARS-Cov-2 infection and should not be used as the sole basis for treatment or other patient management decisions. A negative result may occur with  improper specimen collection/handling, submission of specimen other than nasopharyngeal swab, presence of viral mutation(s) within the areas targeted by this assay, and inadequate number of viral copies(<138 copies/mL). A negative result must be combined with clinical observations, patient history, and epidemiological information. The expected result is Negative.  Fact Sheet for Patients:  EntrepreneurPulse.com.au  Fact  Sheet for Healthcare Providers:  IncredibleEmployment.be  This test is no t yet approved or cleared by the [place] FDA and  has been authorized for detection and/or diagnosis of SARS-CoV-2  by FDA under an Emergency Use Authorization (EUA). This EUA will remain  in effect (meaning this test can be used) for the duration of the COVID-19 declaration under Section 564(b)(1) of the Act, 21 U.S.C.section 360bbb-3(b)(1), unless the authorization is terminated  or revoked sooner.       Influenza A by PCR NEGATIVE NEGATIVE Final   Influenza B by PCR NEGATIVE NEGATIVE Final    Comment: (NOTE) The Xpert Xpress SARS-CoV-2/FLU/RSV plus assay is intended as an aid in the diagnosis of influenza from Nasopharyngeal swab specimens and should not be used as a sole basis for treatment. Nasal washings and aspirates are unacceptable for Xpert Xpress SARS-CoV-2/FLU/RSV testing.  Fact Sheet for Patients: EntrepreneurPulse.com.au  Fact Sheet for Healthcare Providers: IncredibleEmployment.be  This test is not yet approved or cleared by the [place] FDA and has been authorized for detection and/or diagnosis of SARS-CoV-2 by FDA under an Emergency Use Authorization (EUA). This EUA will remain in effect (meaning this test can be used) for the duration of the COVID-19 declaration under Section 564(b)(1) of the Act, 21 U.S.C. section 360bbb-3(b)(1), unless the authorization is terminated or revoked.  Performed at [facility], [address] [place], [place] [address]   Blood Culture (routine x 2)     Status: Abnormal   Collection Time: 03/19/22  9:28 PM   Specimen: BLOOD  Result Value Ref Range Status   Specimen Description   Final    BLOOD RIGHT ANTECUBITAL Performed at [facility], [address] [place], [place] [address]    Special Requests   Final    BOTTLES DRAWN AEROBIC AND ANAEROBIC Blood Culture  adequate volume Performed at [facility], [address] [place], [place] [address]    Culture  Setup Time   Final    GRAM POSITIVE COCCI IN BOTH AEROBIC AND ANAEROBIC BOTTLES CRITICAL RESULT CALLED TO, READ BACK BY AND VERIFIED WITH: CAROLYN CHILDS 03/20/22 1457 AMK Performed at [facility], [address] [place], [place] [address]    Culture STAPHYLOCOCCUS CAPITIS (A)  Final   Report Status 03/23/2022 FINAL  Final   Organism ID, Bacteria STAPHYLOCOCCUS CAPITIS  Final      Susceptibility   Staphylococcus capitis - MIC*    CIPROFLOXACIN <=0.5 SENSITIVE Sensitive     ERYTHROMYCIN <=0.25 SENSITIVE Sensitive     GENTAMICIN <=0.5 SENSITIVE Sensitive     OXACILLIN <=0.25 SENSITIVE Sensitive     TETRACYCLINE <=1 SENSITIVE Sensitive     VANCOMYCIN <=0.5 SENSITIVE Sensitive     TRIMETH/SULFA <=10 SENSITIVE Sensitive     CLINDAMYCIN <=0.25 SENSITIVE Sensitive     RIFAMPIN <=0.5 SENSITIVE Sensitive     Inducible Clindamycin NEGATIVE Sensitive     * STAPHYLOCOCCUS CAPITIS  Blood Culture ID Panel (Reflexed)     Status: Abnormal   Collection Time: 03/19/22  9:28 PM  Result Value Ref Range Status   Enterococcus faecalis NOT DETECTED NOT DETECTED Final   Enterococcus Faecium NOT DETECTED NOT DETECTED Final   Listeria monocytogenes NOT DETECTED NOT DETECTED Final   Staphylococcus species DETECTED (A) NOT DETECTED Final    Comment: CRITICAL RESULT CALLED TO, READ BACK BY AND VERIFIED WITH: CAROLYN CHILDS 03/20/22 1457 AMK    Staphylococcus aureus (BCID) NOT DETECTED NOT DETECTED Final   Staphylococcus epidermidis NOT DETECTED NOT DETECTED Final   Staphylococcus lugdunensis NOT DETECTED NOT DETECTED Final   Streptococcus species NOT DETECTED NOT DETECTED Final   Streptococcus agalactiae NOT DETECTED NOT DETECTED Final  Streptococcus pneumoniae NOT DETECTED NOT DETECTED Final   Streptococcus pyogenes NOT DETECTED NOT DETECTED Final   A.calcoaceticus-baumannii NOT  DETECTED NOT DETECTED Final   Bacteroides fragilis NOT DETECTED NOT DETECTED Final   Enterobacterales NOT DETECTED NOT DETECTED Final   Enterobacter cloacae complex NOT DETECTED NOT DETECTED Final   Escherichia coli NOT DETECTED NOT DETECTED Final   Klebsiella aerogenes NOT DETECTED NOT DETECTED Final   Klebsiella oxytoca NOT DETECTED NOT DETECTED Final   Klebsiella pneumoniae NOT DETECTED NOT DETECTED Final   Proteus species NOT DETECTED NOT DETECTED Final   Salmonella species NOT DETECTED NOT DETECTED Final   Serratia marcescens NOT DETECTED NOT DETECTED Final   Haemophilus influenzae NOT DETECTED NOT DETECTED Final   Neisseria meningitidis NOT DETECTED NOT DETECTED Final   Pseudomonas aeruginosa NOT DETECTED NOT DETECTED Final   Stenotrophomonas maltophilia NOT DETECTED NOT DETECTED Final   Candida albicans NOT DETECTED NOT DETECTED Final   Candida auris NOT DETECTED NOT DETECTED Final   Candida glabrata NOT DETECTED NOT DETECTED Final   Candida krusei NOT DETECTED NOT DETECTED Final   Candida parapsilosis NOT DETECTED NOT DETECTED Final   Candida tropicalis NOT DETECTED NOT DETECTED Final   Cryptococcus neoformans/gattii NOT DETECTED NOT DETECTED Final    Comment: Performed at [facility], [address] [place], [place] [address]  Blood Culture (routine x 2)     Status: Abnormal   Collection Time: 03/19/22 10:23 PM   Specimen: BLOOD  Result Value Ref Range Status   Specimen Description   Final    BLOOD BLOOD RIGHT FOREARM Performed at [facility], [address], [place], [place] [address]    Special Requests   Final    BOTTLES DRAWN AEROBIC AND ANAEROBIC Blood Culture adequate volume Performed at [facility], [address] [place], [place] [address]    Culture  Setup Time   Final    GRAM POSITIVE COCCI IN BOTH AEROBIC AND ANAEROBIC BOTTLES CRITICAL RESULT CALLED TO, READ BACK BY AND VERIFIED WITH: ELVERA MADUEME 03/20/22 1545  AMK Performed at [facility], [address] [place], [place] [address]    Culture (A)  Final    STAPHYLOCOCCUS CAPITIS SUSCEPTIBILITIES PERFORMED ON PREVIOUS CULTURE WITHIN THE LAST 5 DAYS. Performed at [facility], [place] [address], [place], [place] [address]    Report Status 03/22/2022 FINAL  Final  Urine Culture     Status: Abnormal   Collection Time: 03/20/22  1:25 AM   Specimen: In/Out Cath Urine  Result Value Ref Range Status   Specimen Description   Final    IN/OUT CATH URINE Performed at [facility], [address] [place], [place] [address]    Special Requests   Final    NONE Performed at [facility], [place],  [address]    Culture 4,000 COLONIES/mL STAPHYLOCOCCUS EPIDERMIDIS (A)  Final   Report Status 03/22/2022 FINAL  Final   Organism ID, Bacteria STAPHYLOCOCCUS EPIDERMIDIS (A)  Final      Susceptibility   Staphylococcus epidermidis - MIC*    CIPROFLOXACIN <=0.5 SENSITIVE Sensitive     GENTAMICIN <=0.5 SENSITIVE Sensitive     NITROFURANTOIN <=16 SENSITIVE Sensitive     OXACILLIN <=0.25 SENSITIVE Sensitive     TETRACYCLINE <=1 SENSITIVE Sensitive     VANCOMYCIN 2 SENSITIVE Sensitive     TRIMETH/SULFA 80 RESISTANT Resistant     CLINDAMYCIN >=8 RESISTANT Resistant     RIFAMPIN <=0.5 SENSITIVE  Sensitive     Inducible Clindamycin NEGATIVE Sensitive     * 4,000 COLONIES/mL STAPHYLOCOCCUS EPIDERMIDIS  Culture, blood (Routine X 2) w Reflex to ID Panel     Status: None   Collection Time: 03/21/22 11:00 AM   Specimen: BLOOD  Result Value Ref Range Status   Specimen Description BLOOD BLOOD RIGHT FOREARM  Final   Special Requests   Final    BOTTLES DRAWN AEROBIC AND ANAEROBIC Blood Culture adequate volume   Culture   Final    NO GROWTH 5 DAYS Performed at [facility], [address] [place], [place] [address]    Report Status 03/26/2022 FINAL  Final  Culture, blood (Routine X 2) w Reflex to ID  Panel     Status: None (Preliminary result)   Collection Time: 03/22/22 11:53 AM   Specimen: BLOOD RIGHT HAND  Result Value Ref Range Status   Specimen Description BLOOD RIGHT HAND  Final   Special Requests   Final    BOTTLES DRAWN AEROBIC AND ANAEROBIC Blood Culture adequate volume   Culture   Final    NO GROWTH 4 DAYS Performed at [facility], [address] [place], [place] [address]    Report Status PENDING  Incomplete  Aerobic/Anaerobic Culture w Gram Stain (surgical/deep wound)     Status: None (Preliminary result)   Collection Time: 03/25/22 10:30 AM   Specimen: Wound; Blood  Result Value Ref Range Status   Specimen Description WOUND LEFT BREAST  Final   Special Requests BLOOD HEMATOMA  Final   Gram Stain   Final    RARE WBC PRESENT, PREDOMINANTLY MONONUCLEAR NO ORGANISMS SEEN Performed at [facility], [address] [place], [place] [address]    Culture PENDING  Incomplete   Report Status PENDING  Incomplete         Radiology Studies: IR US Guide Bx Asp/Drain  Result Date: 03/25/2022 INDICATION: 71 year old female with complex left breast fluid collection status post surgical intervention. She presents for aspiration and possible drain placement. EXAM: Ultrasound-guided aspiration MEDICATIONS: None ANESTHESIA/SEDATION: None COMPLICATIONS: None immediate. PROCEDURE: Informed written consent was obtained from the patient after a thorough discussion of the procedural risks, benefits and alternatives. All questions were addressed. Maximal Sterile Barrier Technique was utilized including caps, mask, sterile gowns, sterile gloves, sterile drape, hand hygiene and skin antiseptic. A timeout was performed prior to the initiation of the procedure. The left breast was interrogated with ultrasound. There is a large amorphous lace-like complex fluid collection subjacent to the surgical incision site. Sonographically, the collection appears most consistent with  hematoma. A suitable skin entry site was selected and marked. Local anesthesia was attained by infiltration with 1% lidocaine. A small dermatotomy was made. Under real-time ultrasound guidance, a 15 gauge trocar needle was advanced into the fluid collection. Aspiration was then performed yielding approximately 135 mL of old blood. The majority of the liquid component of the collection was successfully aspirated. The remaining collection is too thick/gelatinous for simple aspiration. Samples were sent for Gram stain and culture to exclude superinfection of the hematoma. The patient tolerated the procedure well. IMPRESSION: Successful partial aspiration of large left breast hematoma. The 135 mL liquid component of the hematoma was successfully aspirated. A sample was sent for Gram stain and culture. Electronically Signed   By: Jacqulynn Cadet M.D.   On: 03/25/2022 15:08   CT CHEST W CONTRAST  Result Date: 03/24/2022 CLINICAL DATA:  Breast cancer, status post recent left lumpectomy,  cellulitis and seroma, rule out necrotizing infection * Tracking Code: BO * EXAM: CT CHEST WITH CONTRAST TECHNIQUE: Multidetector CT imaging of the chest was performed during intravenous contrast administration. RADIATION DOSE REDUCTION: This exam was performed according to the departmental dose-optimization program which includes automated exposure control, adjustment of the mA and/or kV according to patient size and/or use of iterative reconstruction technique. CONTRAST:  79m OMNIPAQUE IOHEXOL 300 MG/ML  SOLN COMPARISON:  CT chest angiogram, 03/19/2022, CT chest, 12/18/2013 FINDINGS: Cardiovascular: Right chest port catheter. Aortic atherosclerosis. Cardiomegaly. Three-vessel coronary artery calcifications. No pericardial effusion. Mediastinum/Nodes: Numerous prominent mediastinal and bilateral hilar lymph nodes unchanged compared to recent prior examination and very similar to remote prior examination dated 12/18/2013. Largest  pretracheal node measures 2.3 x 1.7 cm (series 7, image 56). Thyroid gland, trachea, and esophagus demonstrate no significant findings. Lungs/Pleura: Innumerable small pulmonary nodules throughout the lungs, not changed from recent prior examination, although the great majority of which appear new compared to more remote prior examination dated 12/18/2013. Example nodule of the right apex measures 0.6 cm (series 3, image 32). Additional index nodule of the anterior left upper lobe measures 0.7 cm (series 3, image 48). Diffuse bilateral bronchial wall thickening and extensive interlobular septal thickening throughout, including non dependent portions of the lungs. Generally bandlike scarring and or atelectasis of the bilateral lung bases. No pleural effusion or pneumothorax. Upper Abdomen: No acute abnormality. Small, benign left adrenal myelolipoma which no further specific follow-up or characterization required (series 2, image 138). Musculoskeletal: Large, partially imaged fluid collection within the left breast containing scattered internal air loculations with extensive adjacent fat stranding and overlying soft tissue thickening, although incompletely imaged due to large body habitus, measures at least 8.5 cm (series 7, image 92). Additional adjacent collection within the left axilla is fully imaged and measures 11.4 x 5.8 cm (series 7, image 42). No acute osseous findings. IMPRESSION: 1. Large, partially imaged fluid collection within the left breast containing scattered internal air loculations with extensive adjacent fat stranding and overlying soft tissue thickening, although incompletely imaged due to large body habitus, measures at least 8.5 cm. Persistent presence of air within this fluid collection is worrisome for gas-forming infection. 2. Additional adjacent collection within the left axilla is fully imaged and measures 11.4 x 5.8 cm without evidence of gas content and of simple fluid attenuation, most  consistent with seroma. The presence or absence of infection within either of these fluid collections is not strictly established by CT. 3. Innumerable small pulmonary nodules throughout the lungs, not changed from recent prior examination, although the great majority of which appear new compared to more remote prior examination dated 12/18/2013. These are worrisome for pulmonary metastatic disease in the setting of breast cancer although potentially infectious or inflammatory. 4. Diffuse bilateral bronchial wall thickening and interlobular septal thickening throughout, possibly reflecting pulmonary edema, although interlobular septal thickening is worrisome as a potential feature of lymphangitic metastatic disease in the setting of breast cancer. 5. Numerous prominent mediastinal and bilateral hilar lymph nodes unchanged compared to recent prior examination and very similar to remote prior examination dated 12/18/2013, most likely benign and reactive. 6. Cardiomegaly and coronary artery disease. Aortic Atherosclerosis (ICD10-I70.0). Electronically Signed   By: ADelanna AhmadiM.D.   On: 03/24/2022 16:48   [place] UN[place] LEFT [place] AX[place]  Result Date: 03/24/2022 CLINICAL DATA:  Mass of left breast, assess for abscess. Patient is status post left breast lumpectomy August 11th 2023. EXAM: ULTRASOUND OF THE LEFT  BREAST COMPARISON:  Prior films FINDINGS: Targeted ultrasound is performed, showing complicated cystic lesion at the palpable area left breast 1-3 o'clock 12 cm from nipple measuring 14.5 x 6.3 x 9.2 cm. This may represent postsurgical seroma but abscess is not excluded based on ultrasound. IMPRESSION: Benign findings. Large mass at the left breast 1-3 o'clock 12 cm from nipple. This may represent postsurgical seroma but abscess is not excluded based on ultrasound. RECOMMENDATION: Management on clinical basis. I have discussed the findings and recommendations with the patient. If applicable, a reminder  letter will be sent to the patient regarding the next appointment. BI-RADS CATEGORY  2: Benign. Electronically Signed   By: Abelardo Diesel M.D.   On: 03/24/2022 10:15        Scheduled Meds:  sodium chloride   Intravenous Once   atorvastatin  80 mg Oral QPM   calcium acetate  2,001 mg Oral TID WC   Chlorhexidine Gluconate Cloth  6 each Topical Q0600   vitamin D3  1,000 Units Oral Daily   cinacalcet  30 mg Oral Q breakfast   docusate sodium  200 mg Oral BID   epoetin (EPOGEN/PROCRIT) injection  10,000 Units Intravenous Q M,W,F-HD   fluticasone  1 spray Each Nare Daily   gabapentin  100 mg Oral BID   insulin aspart  0-6 Units Subcutaneous TID WC   insulin aspart protamine- aspart  10 Units Subcutaneous BID WC   levETIRAcetam  1,000 mg Oral Daily   levETIRAcetam  250 mg Oral Once per day on Mon Wed Fri   losartan  100 mg Oral QHS   metoprolol succinate  100 mg Oral Q breakfast   pantoprazole  40 mg Oral Daily   polyethylene glycol  17 g Oral Daily   torsemide  100 mg Oral Q breakfast   Continuous Infusions:  sodium chloride 10 mL/hr at 03/25/22 7494    ceFAZolin (ANCEF) IV 1 g (03/25/22 1954)   heparin 1,500 Units/hr (03/25/22 0620)     LOS: 6 days    Time spent: 35 mins     Nolberto Hanlon, MD [facility] Pager [phone]  If 7PM-7AM, please contact night-coverage [email or web address] 03/26/2022, 8:46 AM

## 2022-03-26 NOTE — Progress Notes (Signed)
ANTICOAGULATION CONSULT NOTE  Pharmacy Consult for IV heparin Indication: pulmonary embolus  Allergies  Allergen Reactions   Oxycodone Nausea And Vomiting   Oxycodone-Acetaminophen Other (See Comments)   Wound Dressing Adhesive    Hydrocodone     Intolerant more than allergic   Tape Itching    Skin Dermatitis/itching (tape adhesive) Skin Dermatitis/itching (tape adhesive)   Tapentadol Itching    Skin Dermatitis/itching (tape adhesive) Skin Dermatitis/itching (tape adhesive)     Patient Measurements: Height: '5\' 9"'$  (175.3 cm) Weight: 115 kg (253 lb 8.5 oz) IBW/kg (Calculated) : 66.2 Heparin Dosing Weight: 123 kg  Vital Signs: Temp: 98.4 F (36.9 C) (09/02 0750) Temp Source: Oral (09/01 2356) BP: 145/75 (09/02 0750) Pulse Rate: 64 (09/02 0750)  Labs: Recent Labs    03/24/22 1656 03/25/22 0523 03/25/22 1331 03/26/22 0714  HGB  --  7.2*  --  8.3*  HCT  --  23.3*  --  26.6*  PLT  --  255  --  267  APTT  --  89* 80* 84*  LABPROT 19.6*  --   --   --   INR 1.7*  --   --   --   HEPARINUNFRC  --  >1.10*  --  >1.10*  CREATININE  --  8.43*  --   --      Estimated Creatinine Clearance: 8.3 mL/min (A) (by C-G formula based on SCr of 8.43 mg/dL (H)).   Medications:  Medications Prior to Admission  Medication Sig Dispense Refill Last Dose   acetaminophen (TYLENOL) 500 MG tablet Take 500 mg by mouth every 6 (six) hours as needed.   prn at prn   aspirin EC 81 MG tablet Take 81 mg by mouth daily. Swallow whole.   Past Week   atorvastatin (LIPITOR) 80 MG tablet Take 80 mg by mouth every evening.    Past Week   calcium acetate (PHOSLO) 667 MG capsule Take 3 capsules (2,001 mg total) by mouth 3 (three) times daily with meals.   Past Week   cholecalciferol (VITAMIN D) 25 MCG tablet Take 1 tablet (1,000 Units total) by mouth daily.   Past Week   cinacalcet (SENSIPAR) 30 MG tablet Take 30 mg by mouth daily at 6 (six) AM. Take with largest meal   Past Week   clopidogrel (PLAVIX)  75 MG tablet TAKE ONE TABLET BY MOUTH ONCE DAILY (Patient taking differently: Take 75 mg by mouth daily.) 30 tablet 5 Past Week   fluticasone (FLONASE) 50 MCG/ACT nasal spray Place 1 spray into both nostrils daily.   Past Week   gabapentin (NEURONTIN) 100 MG capsule Take 1 capsule (100 mg total) by mouth at bedtime. (Patient taking differently: Take 100 mg by mouth 2 (two) times daily.)   Past Week   glucose 4 GM chewable tablet Chew 1 tablet by mouth as needed for low blood sugar.   prn at prn   Ipratropium-Albuterol (COMBIVENT) 20-100 MCG/ACT AERS respimat Inhale 1 puff into the lungs every 6 (six) hours. (Patient taking differently: Inhale 1 puff into the lungs 2 (two) times daily.) 4 g 0 Past Week   levETIRAcetam (KEPPRA) 1000 MG tablet Take 1 tablet (1,000 mg total) by mouth daily. 30 tablet 0 Past Week   levETIRAcetam (KEPPRA) 250 MG tablet Take 250 mg by mouth 3 (three) times a week.   Past Week   lidocaine-prilocaine (EMLA) cream Apply 1 application topically as needed (for port access).    Past Week   losartan (COZAAR) 100  MG tablet Take 100 mg by mouth at bedtime.   Past Week   metoprolol succinate (TOPROL-XL) 100 MG 24 hr tablet Take 100 mg by mouth daily. Take with or immediately following a meal.    Past Week   NOVOLIN 70/30 (70-30) 100 UNIT/ML injection SMARTSIG:28 Unit(s) SUB-Q Twice Daily   Past Week   omeprazole (PRILOSEC) 20 MG capsule Take 20 mg by mouth 2 (two) times daily.    Past Week   torsemide (DEMADEX) 100 MG tablet Take 100 mg by mouth daily.   Past Week   diphenoxylate-atropine (LOMOTIL) 2.5-0.025 MG tablet SMARTSIG:1 Tablet(s) By Mouth Every 12 Hours PRN   prn at prn   loperamide (IMODIUM) 2 MG capsule Take 2 mg by mouth 4 (four) times daily as needed.   prn at prn   oxyCODONE (OXY IR/ROXICODONE) 5 MG immediate release tablet Take 5 mg by mouth every 6 (six) hours as needed.   prn at prn   Scheduled:   sodium chloride   Intravenous Once   atorvastatin  80 mg Oral QPM    calcium acetate  2,001 mg Oral TID WC   Chlorhexidine Gluconate Cloth  6 each Topical Q0600   vitamin D3  1,000 Units Oral Daily   cinacalcet  30 mg Oral Q breakfast   docusate sodium  200 mg Oral BID   epoetin (EPOGEN/PROCRIT) injection  10,000 Units Intravenous Q M,W,F-HD   fluticasone  1 spray Each Nare Daily   gabapentin  100 mg Oral BID   insulin aspart  0-6 Units Subcutaneous TID WC   insulin aspart protamine- aspart  10 Units Subcutaneous BID WC   levETIRAcetam  1,000 mg Oral Daily   levETIRAcetam  250 mg Oral Once per day on Mon Wed Fri   losartan  100 mg Oral QHS   metoprolol succinate  100 mg Oral Q breakfast   pantoprazole  40 mg Oral Daily   polyethylene glycol  17 g Oral Daily   torsemide  100 mg Oral Q breakfast   Infusions:   sodium chloride 10 mL/hr at 03/25/22 0620    ceFAZolin (ANCEF) IV 1 g (03/25/22 1954)   heparin 1,500 Units/hr (03/25/22 0620)   PRN: sodium chloride, acetaminophen **OR** acetaminophen, diphenoxylate-atropine, glucose, ipratropium-albuterol, lidocaine-prilocaine, magnesium hydroxide, metoCLOPramide (REGLAN) injection, oxyCODONE, traZODone Anti-infectives (From admission, onward)    Start     Dose/Rate Route Frequency Ordered Stop   03/23/22 1800  ceFAZolin (ANCEF) IVPB 1 g/50 mL premix        1 g 100 mL/hr over 30 Minutes Intravenous Daily-1800 03/23/22 1243     03/20/22 1615  doxycycline (VIBRAMYCIN) 100 mg in sodium chloride 0.9 % 250 mL IVPB  Status:  Discontinued        100 mg 125 mL/hr over 120 Minutes Intravenous Every 12 hours 03/20/22 1604 03/23/22 1242   03/20/22 1000  cefTRIAXone (ROCEPHIN) 2 g in sodium chloride 0.9 % 100 mL IVPB  Status:  Discontinued        2 g 200 mL/hr over 30 Minutes Intravenous Every 24 hours 03/20/22 0148 03/23/22 1242   03/20/22 0200  cefTRIAXone (ROCEPHIN) 2 g in sodium chloride 0.9 % 100 mL IVPB  Status:  Discontinued        2 g 200 mL/hr over 30 Minutes Intravenous Every 24 hours 03/20/22 0147  03/20/22 0148   03/19/22 2200  vancomycin (VANCOCIN) IVPB 1000 mg/200 mL premix        1,000 mg 200 mL/hr over 60  Minutes Intravenous  Once 03/19/22 2159 03/19/22 2329   03/19/22 2200  ceFEPIme (MAXIPIME) 2 g in sodium chloride 0.9 % 100 mL IVPB        2 g 200 mL/hr over 30 Minutes Intravenous  Once 03/19/22 2159 03/19/22 2322       Assessment: 71 year old female admitted with pulmonary embolism. Receiving procedure 9/1 to investigate CT scan.   Hgb low 2/2 CKD related anemia.  Last Eliquis dose 8/31 1000   Goal of Therapy:  Heparin level 0.3-0.7 units/ml Monitor platelets by anticoagulation protocol: Yes  Date Time aPTT/HL Rate/Comment 0901 0523 89 / >1.10 Therapeutic x 1 0901 1331 80 / ----  Therapeutic x 2 0902 0714 84/>1.10 Thera x 3  Plan:  0902 0714 aPTT 84/ HL>1.10   aPTT therapeutic, Continune heparin infusion at 1500 units/hr Check anti-Xa level and aPTT tomorrow morning with AM labs. F/u for correlation. Continue to monitor H&H and platelets daily while on heparin gtt.   Wiley Flicker A Clinical Pharmacist  03/26/2022 9:24 AM

## 2022-03-27 DIAGNOSIS — A419 Sepsis, unspecified organism: Secondary | ICD-10-CM | POA: Diagnosis not present

## 2022-03-27 DIAGNOSIS — I2699 Other pulmonary embolism without acute cor pulmonale: Secondary | ICD-10-CM | POA: Diagnosis not present

## 2022-03-27 DIAGNOSIS — R7881 Bacteremia: Secondary | ICD-10-CM | POA: Diagnosis not present

## 2022-03-27 DIAGNOSIS — D638 Anemia in other chronic diseases classified elsewhere: Secondary | ICD-10-CM | POA: Diagnosis not present

## 2022-03-27 LAB — CBC
HCT: 26.4 % — ABNORMAL LOW (ref 36.0–46.0)
Hemoglobin: 8.2 g/dL — ABNORMAL LOW (ref 12.0–15.0)
MCH: 27.4 pg (ref 26.0–34.0)
MCHC: 31.1 g/dL (ref 30.0–36.0)
MCV: 88.3 fL (ref 80.0–100.0)
Platelets: 277 10*3/uL (ref 150–400)
RBC: 2.99 MIL/uL — ABNORMAL LOW (ref 3.87–5.11)
RDW: 17.3 % — ABNORMAL HIGH (ref 11.5–15.5)
WBC: 11.9 10*3/uL — ABNORMAL HIGH (ref 4.0–10.5)
nRBC: 0 % (ref 0.0–0.2)

## 2022-03-27 LAB — GLUCOSE, CAPILLARY
Glucose-Capillary: 137 mg/dL — ABNORMAL HIGH (ref 70–99)
Glucose-Capillary: 160 mg/dL — ABNORMAL HIGH (ref 70–99)
Glucose-Capillary: 149 mg/dL — ABNORMAL HIGH (ref 70–99)
Glucose-Capillary: 172 mg/dL — ABNORMAL HIGH (ref 70–99)

## 2022-03-27 LAB — CULTURE, BLOOD (ROUTINE X 2)
Culture: NO GROWTH
Special Requests: ADEQUATE

## 2022-03-27 LAB — APTT: aPTT: 90 seconds — ABNORMAL HIGH (ref 24–36)

## 2022-03-27 LAB — HEPARIN LEVEL (UNFRACTIONATED): Heparin Unfractionated: 0.77 IU/mL — ABNORMAL HIGH (ref 0.30–0.70)

## 2022-03-27 MED ORDER — APIXABAN 5 MG PO TABS: 5.0000 mg | ORAL_TABLET | Freq: Two times a day (BID) | ORAL | Status: DC

## 2022-03-27 MED ORDER — APIXABAN 5 MG PO TABS
10.0000 mg | ORAL_TABLET | Freq: Two times a day (BID) | ORAL | Status: DC
  Administered 2022-03-27 – 2022-04-01 (×11): 10 mg via ORAL
  Filled 2022-03-27 (×11): qty 2

## 2022-03-27 MED ORDER — CEFAZOLIN SODIUM-DEXTROSE 1-4 GM/50ML-% IV SOLN
1.0000 g | INTRAVENOUS | Status: AC
  Administered 2022-03-27 – 2022-03-31 (×5): 1 g via INTRAVENOUS
  Filled 2022-03-27 (×7): qty 50

## 2022-03-27 NOTE — Progress Notes (Signed)
PROGRESS NOTE   HPI was taken from Dr. Sidney Ace: Cynthia Dean is a 71 y.o. African-American female with medical history significant for COPD, carotid stenosis, type 2 diabetes mellitus, end-stage renal disease on hemodialysis on MWF, hypertension and dyslipidemia, as well as invasive ductal carcinoma of the left breast status post surgical excision at [facility] recently, who presented to the emergency room with acute onset of generalized weakness and fatigue for the last couple of days.  She missed hemodialysis session yesterday.  She was fairly confused in the ER.  She has been having dyspnea with mild cough and occasional wheezing.  She admitted to fever and chills.  No chest pain or palpitations.  No nausea or vomiting or diarrhea or abdominal pain.  No bleeding diathesis.   ED Course: Upon presentation to the emergency room temperature was 102.9 with BP 139/57 heart rate 50 with respiratory to 23  with pulse oximetry of 87% on room air and 98% on 4 L of O2 by nasal cannula.  Labs revealed leukocytosis 16.5 with neutrophilia and hemoglobin of 8.1 with hematocrit 26.1 with platelets of 214.  Influenza antigens and COVID-19 PCR came back negative.  Blood cultures were drawn. EKG as reviewed by me : EKG showed sinus rhythm with rate of 81 with T wave inversion laterally. Imaging: Portable chest x-ray showed increasing vascular congestion without pulmonary edema.  Chest CTA revealed small pulmonary emboli within both lower lobes and the right upper lobe with no evidence for right heart strain.  It showed cardiomegaly and coronary artery disease with bibasal atelectasis and large masslike area in the left breast with surrounding soft tissue gas that corresponds with recent left lumpectomy.   The patient was given IV cefepime and heparin bolus and drip as well as 500 mL IV normal saline.  She will be admitted to a progressive unit bed for further evaluation and management.  As per Dr. Jimmye Norman  8/27-8/29/23: Pt presented w/ shortness of breath and was found to have acute pulmonary embolism w/o right heart strain. Pt was initially put on IV heparin drip and then changed to po eliquis. Echo is pending. Also, pt was admitted for mild breast cellulitis and has been on IV rocephin. Urine cx was growing staph epidermis and blood cxs growing stap captis for which pt has been on IV doxycycline. The blood cxs likely represent a containment. Repeat blood cxs NGTD. PT recs SNF.   8/31 LT breast US done.  9/1 s/p IR aspiration of Lt breast fluid collection 9/2 WBC up.  Spoke to Dr. Lysle Pearl recommended continuing heparin drip just in case she needs I&D 9/3 no overnight issues. H/h stable.   Cynthia Dean  MMH:[id number] DOB: 07-18-1951 DOA: 03/19/2022 PCP: Lowella Bandy, MD   Assessment & Plan:   Principal Problem:   Acute pulmonary embolism without acute cor pulmonale (HCC) Active Problems:   Sepsis due to cellulitis ([place])   ESRD on hemodialysis ([place])   Essential hypertension   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[place])   Dyslipidemia   Diabetic neuropathy (HCC)   Urinary tract infection   Anemia of chronic disease   Left breast abscess   Seroma of breast   Bacteremia due to coagulase-negative Staphylococcus  Assessment and Plan: Acute pulmonary embolism: without acute cor pulmonale. D/c IV heparin and start eliquis.  8/30 aspirin Plavix on hold  Echo with EF 50 to 55%.  Grade 2 diastolic dysfunction.  RV normal function and size  Holding  home dose of aspirin, plavix (was not on plavix per pharm but will ck again) 2/58 RV systolic function and size normal.  EF 50 to 55% 9/3 spoke to surgery okay to transition to Eliquis from heparin drip  Pharmacy consulted       ACD: likely secondary to ESRD.  W/ possible component of acute blood loss anemia. S/p 1 unit of pRBCs transfused so far.  9/1 hg down to 7.2, spoke to pt and nephrology made aware. Pt  agreeable to prbc transfusion , will give one unit in HD today.  No reports of bleed. 9/3 H&H stable  Status post 1 unit PRBC during dialysis on 9/1   continue to monitor         Sepsis: see Dr. Randel Books note on how pt met sepsis criteria. Likely secondary to left breast cellulitis. 8/31 on Rocephin  9/3 resolved       Mild cellulitis of left breast:  On Rocephin...changed to cefazolin by Id 8/31 Lt breast US with seroma v.s. abscess. 9/1 s/p IR aspiration of hematoma. Cx sent 9/2 gsx following. Wbc up . Gsx rec. Continue heparin gtt incase needs I/d.  9/3 no worsening on exam. Okay to resume oral a/c per surgery.    Staph capitis bacteremia:  Likely from breast  ID following 9/2 repeat cultures negative to date   Possible UTI: urine cx staph epidermidis.  Was treated with iv doxy. Now on iv cefazolin for above    ESRD: on HD MWF.  Nephrology following    Constipation: continue on colace, miralax   Breast cancer: recent dx. S/p surgical excision at [facility] & chemo. High risk of blood clots w/ hx of cancer    Diabetic neuropathy: continue on home dose of gabapentin    HLD: continue on statin    DM2: likely poorly controlled. HbA1c ordered for AM. Continue on 70/30, SSI w/ accuchecks    HTN: continue on metoprolol, losartan           DVT prophylaxis: elqiuis Code Status:  full  Family Communication: None at bedside Disposition Plan: SNF  Level of care: Progressive  Status is: Inpatient Remains inpatient appropriate because: severity of illness, getting transfusion    Consultants:  Nephro   Procedures:  Antimicrobials: rocephin, doxycycline ..>cefazolin   Subjective: No shortness of breath or chest pain or abdominal pain  Objective: Vitals:   03/27/22 0037 03/27/22 0416 03/27/22 0816 03/27/22 1204  BP: (!) 111/42 (!) 121/46 124/62 (!) 98/34  Pulse: 67 62 60 (!) 59  Resp: _0 Temp: 98.8 F (37.1 C) 98.8 F (37.1 C) 98 F  (36.7 C) 97.8 F (36.6 C)  TempSrc: Oral Oral    SpO2: 100% 100% 95% 95%  Weight:      Height:        Intake/Output Summary (Last 24 hours) at 03/27/2022 1333 Last data filed at 03/27/2022 0351 Gross per 24 hour  Intake 656.22 ml  Output --  Net 656.22 ml   Filed Weights   03/25/22 0342 03/25/22 1149 03/25/22 1621  Weight: 118.5 kg 116 kg 115 kg    Examination: Calm, NAD Cta no w/r Lt breast area, less drainage, less erythema. improving Reg s1/s2 no gallop Soft benign +bs No edema Aaoxox3  Mood and affect appropriate in current setting     Data Reviewed: I have personally reviewed following labs and imaging studies  CBC: Recent Labs  Lab 03/22/22 0156 03/23/22 0647 03/25/22 0523 03/26/22 0714 03/27/22  0447  WBC 9.7 10.4 10.9* 13.1* 11.9*  HGB 8.0* 7.5* 7.2* 8.3* 8.2*  HCT 25.3* 24.3* 23.3* 26.6* 26.4*  MCV 86.6 87.7 89.6 87.5 88.3  PLT 210 235 255 267 734   Basic Metabolic Panel: Recent Labs  Lab 03/21/22 0655 03/22/22 0156 03/23/22 0647 03/25/22 0523  NA 135 136 137 137  K 4.6 3.6 4.4 4.2  CL 101 98 101 101  CO2 _0 GLUCOSE 84 91 101* 119*  BUN 60* 32* 38* 27*  CREATININE 12.47* 7.57* 9.31* 8.43*  CALCIUM 8.7* 8.7* 9.5 9.4   GFR: Estimated Creatinine Clearance: 8.3 mL/min (A) (by C-G formula based on SCr of 8.43 mg/dL (H)). Liver Function Tests: Recent Labs  Lab 03/25/22 0523  AST 14*  ALT 8  ALKPHOS 73  BILITOT 0.7  PROT 6.1*  ALBUMIN 2.6*   No results for input(s): "LIPASE", "AMYLASE" in the last 168 hours. No results for input(s): "AMMONIA" in the last 168 hours. Coagulation Profile: Recent Labs  Lab 03/24/22 1656  INR 1.7*   Cardiac Enzymes: No results for input(s): "CKTOTAL", "CKMB", "CKMBINDEX", "TROPONINI" in the last 168 hours. BNP (last 3 results) No results for input(s): "PROBNP" in the last 8760 hours. HbA1C: No results for input(s): "HGBA1C" in the last 72 hours.  CBG: Recent Labs  Lab 03/26/22 1230  03/26/22 1642 03/26/22 2129 03/27/22 0818 03/27/22 1203  GLUCAP 130* 132* 107* 137* 160*   Lipid Profile: No results for input(s): "CHOL", "HDL", "LDLCALC", "TRIG", "CHOLHDL", "LDLDIRECT" in the last 72 hours. Thyroid Function Tests: No results for input(s): "TSH", "T4TOTAL", "FREET4", "T3FREE", "THYROIDAB" in the last 72 hours. Anemia Panel: No results for input(s): "VITAMINB12", "FOLATE", "FERRITIN", "TIBC", "IRON", "RETICCTPCT" in the last 72 hours. Sepsis Labs: No results for input(s): "PROCALCITON", "LATICACIDVEN" in the last 168 hours.   Recent Results (from the past 240 hour(s))  Resp Panel by RT-PCR (Flu A&B, Covid) Anterior Nasal Swab     Status: None   Collection Time: 03/19/22  9:28 PM   Specimen: Anterior Nasal Swab  Result Value Ref Range Status   SARS Coronavirus 2 by RT PCR NEGATIVE NEGATIVE Final    Comment: (NOTE) SARS-CoV-2 target nucleic acids are NOT DETECTED.  The SARS-CoV-2 RNA is generally detectable in upper respiratory specimens during the acute phase of infection. The lowest concentration of SARS-CoV-2 viral copies this assay can detect is 138 copies/mL. A negative result does not preclude SARS-Cov-2 infection and should not be used as the sole basis for treatment or other patient management decisions. A negative result may occur with  improper specimen collection/handling, submission of specimen other than nasopharyngeal swab, presence of viral mutation(s) within the areas targeted by this assay, and inadequate number of viral copies(<138 copies/mL). A negative result must be combined with clinical observations, patient history, and epidemiological information. The expected result is Negative.  Fact Sheet for Patients:  EntrepreneurPulse.com.au  Fact Sheet for Healthcare Providers:  IncredibleEmployment.be  This test is no t yet approved or cleared by the [place] FDA and  has been authorized for  detection and/or diagnosis of SARS-CoV-2 by FDA under an Emergency Use Authorization (EUA). This EUA will remain  in effect (meaning this test can be used) for the duration of the COVID-19 declaration under Section 564(b)(1) of the Act, 21 U.S.C.section 360bbb-3(b)(1), unless the authorization is terminated  or revoked sooner.       Influenza A by PCR NEGATIVE NEGATIVE Final   Influenza B by PCR NEGATIVE NEGATIVE  Final    Comment: (NOTE) The Xpert Xpress SARS-CoV-2/FLU/RSV plus assay is intended as an aid in the diagnosis of influenza from Nasopharyngeal swab specimens and should not be used as a sole basis for treatment. Nasal washings and aspirates are unacceptable for Xpert Xpress SARS-CoV-2/FLU/RSV testing.  Fact Sheet for Patients: EntrepreneurPulse.com.au  Fact Sheet for Healthcare Providers: IncredibleEmployment.be  This test is not yet approved or cleared by the [place] FDA and has been authorized for detection and/or diagnosis of SARS-CoV-2 by FDA under an Emergency Use Authorization (EUA). This EUA will remain in effect (meaning this test can be used) for the duration of the COVID-19 declaration under Section 564(b)(1) of the Act, 21 U.S.C. section 360bbb-3(b)(1), unless the authorization is terminated or revoked.  Performed at [facility], [address] [place], [place] [address]   Blood Culture (routine x 2)     Status: Abnormal   Collection Time: 03/19/22  9:28 PM   Specimen: BLOOD  Result Value Ref Range Status   Specimen Description   Final    BLOOD RIGHT ANTECUBITAL Performed at [facility], [address] [place], [place] [address]    Special Requests   Final    BOTTLES DRAWN AEROBIC AND ANAEROBIC Blood Culture adequate volume Performed at [facility], [place], [place] [address]    Culture  Setup Time   Final    GRAM POSITIVE COCCI IN BOTH AEROBIC AND  ANAEROBIC BOTTLES CRITICAL RESULT CALLED TO, READ BACK BY AND VERIFIED WITH: CAROLYN CHILDS 03/20/22 1457 AMK Performed at [facility], [address] [place], [place] [address]    Culture STAPHYLOCOCCUS CAPITIS (A)  Final   Report Status 03/23/2022 FINAL  Final   Organism ID, Bacteria STAPHYLOCOCCUS CAPITIS  Final      Susceptibility   Staphylococcus capitis - MIC*    CIPROFLOXACIN <=0.5 SENSITIVE Sensitive     ERYTHROMYCIN <=0.25 SENSITIVE Sensitive     GENTAMICIN <=0.5 SENSITIVE Sensitive     OXACILLIN <=0.25 SENSITIVE Sensitive     TETRACYCLINE <=1 SENSITIVE Sensitive     VANCOMYCIN <=0.5 SENSITIVE Sensitive     TRIMETH/SULFA <=10 SENSITIVE Sensitive     CLINDAMYCIN <=0.25 SENSITIVE Sensitive     RIFAMPIN <=0.5 SENSITIVE Sensitive     Inducible Clindamycin NEGATIVE Sensitive     * STAPHYLOCOCCUS CAPITIS  Blood Culture ID Panel (Reflexed)     Status: Abnormal   Collection Time: 03/19/22  9:28 PM  Result Value Ref Range Status   Enterococcus faecalis NOT DETECTED NOT DETECTED Final   Enterococcus Faecium NOT DETECTED NOT DETECTED Final   Listeria monocytogenes NOT DETECTED NOT DETECTED Final   Staphylococcus species DETECTED (A) NOT DETECTED Final    Comment: CRITICAL RESULT CALLED TO, READ BACK BY AND VERIFIED WITH: CAROLYN CHILDS 03/20/22 1457 AMK    Staphylococcus aureus (BCID) NOT DETECTED NOT DETECTED Final   Staphylococcus epidermidis NOT DETECTED NOT DETECTED Final   Staphylococcus lugdunensis NOT DETECTED NOT DETECTED Final   Streptococcus species NOT DETECTED NOT DETECTED Final   Streptococcus agalactiae NOT DETECTED NOT DETECTED Final   Streptococcus pneumoniae NOT DETECTED NOT DETECTED Final   Streptococcus pyogenes NOT DETECTED NOT DETECTED Final   A.calcoaceticus-baumannii NOT DETECTED NOT DETECTED Final   Bacteroides fragilis NOT DETECTED NOT DETECTED Final   Enterobacterales NOT DETECTED NOT DETECTED Final   Enterobacter cloacae complex NOT DETECTED  NOT DETECTED Final   Escherichia coli NOT DETECTED NOT DETECTED Final   Klebsiella aerogenes NOT  DETECTED NOT DETECTED Final   Klebsiella oxytoca NOT DETECTED NOT DETECTED Final   Klebsiella pneumoniae NOT DETECTED NOT DETECTED Final   Proteus species NOT DETECTED NOT DETECTED Final   Salmonella species NOT DETECTED NOT DETECTED Final   Serratia marcescens NOT DETECTED NOT DETECTED Final   Haemophilus influenzae NOT DETECTED NOT DETECTED Final   Neisseria meningitidis NOT DETECTED NOT DETECTED Final   Pseudomonas aeruginosa NOT DETECTED NOT DETECTED Final   Stenotrophomonas maltophilia NOT DETECTED NOT DETECTED Final   Candida albicans NOT DETECTED NOT DETECTED Final   Candida auris NOT DETECTED NOT DETECTED Final   Candida glabrata NOT DETECTED NOT DETECTED Final   Candida krusei NOT DETECTED NOT DETECTED Final   Candida parapsilosis NOT DETECTED NOT DETECTED Final   Candida tropicalis NOT DETECTED NOT DETECTED Final   Cryptococcus neoformans/gattii NOT DETECTED NOT DETECTED Final    Comment: Performed at [facility], [address] [place], [place] [address]  Blood Culture (routine x 2)     Status: Abnormal   Collection Time: 03/19/22 10:23 PM   Specimen: BLOOD  Result Value Ref Range Status   Specimen Description   Final    BLOOD BLOOD RIGHT FOREARM Performed at [facility], [address], [place], [place] [address]    Special Requests   Final    BOTTLES DRAWN AEROBIC AND ANAEROBIC Blood Culture adequate volume Performed at [facility], [address] [place], [place] [address]    Culture  Setup Time   Final    GRAM POSITIVE COCCI IN BOTH AEROBIC AND ANAEROBIC BOTTLES CRITICAL RESULT CALLED TO, READ BACK BY AND VERIFIED WITH: ELVERA MADUEME 03/20/22 1545 AMK Performed at [facility], [address] [place], [place] [address]    Culture (A)  Final    STAPHYLOCOCCUS CAPITIS SUSCEPTIBILITIES PERFORMED ON PREVIOUS CULTURE WITHIN  THE LAST 5 DAYS. Performed at [facility], [place], [place], [place] [address]    Report Status 03/22/2022 FINAL  Final  Urine Culture     Status: Abnormal   Collection Time: 03/20/22  1:25 AM   Specimen: In/Out Cath Urine  Result Value Ref Range Status   Specimen Description   Final    IN/OUT CATH URINE Performed at [facility], [address] [place], [place] [address]    Special Requests   Final    NONE Performed at [facility], [place], [place] [address]    Culture 4,000 COLONIES/mL STAPHYLOCOCCUS EPIDERMIDIS (A)  Final   Report Status 03/22/2022 FINAL  Final   Organism ID, Bacteria STAPHYLOCOCCUS EPIDERMIDIS (A)  Final      Susceptibility   Staphylococcus epidermidis - MIC*    CIPROFLOXACIN <=0.5 SENSITIVE Sensitive     GENTAMICIN <=0.5 SENSITIVE Sensitive     NITROFURANTOIN <=16 SENSITIVE Sensitive     OXACILLIN <=0.25 SENSITIVE Sensitive     TETRACYCLINE <=1 SENSITIVE Sensitive     VANCOMYCIN 2 SENSITIVE Sensitive     TRIMETH/SULFA 80 RESISTANT Resistant     CLINDAMYCIN >=8 RESISTANT Resistant     RIFAMPIN <=0.5 SENSITIVE Sensitive     Inducible Clindamycin NEGATIVE Sensitive     * 4,000 COLONIES/mL STAPHYLOCOCCUS EPIDERMIDIS  Culture, blood (Routine X 2) w Reflex to ID Panel     Status: None   Collection Time: 03/21/22 11:00 AM   Specimen: BLOOD  Result Value Ref Range Status   Specimen Description BLOOD BLOOD RIGHT FOREARM  Final   Special Requests  Final    BOTTLES DRAWN AEROBIC AND ANAEROBIC Blood Culture adequate volume   Culture   Final    NO GROWTH 5 DAYS Performed at [facility], [address] [place], [place] [address]    Report Status 03/26/2022 FINAL  Final  Culture, blood (Routine X 2) w Reflex to ID Panel     Status: None   Collection Time: 03/22/22 11:53 AM   Specimen: BLOOD RIGHT HAND  Result Value Ref Range Status   Specimen Description BLOOD RIGHT HAND  Final   Special Requests    Final    BOTTLES DRAWN AEROBIC AND ANAEROBIC Blood Culture adequate volume   Culture   Final    NO GROWTH 5 DAYS Performed at [facility], [address] [place], [place] [address]    Report Status 03/27/2022 FINAL  Final  Aerobic/Anaerobic Culture w Gram Stain (surgical/deep wound)     Status: None (Preliminary result)   Collection Time: 03/25/22 10:30 AM   Specimen: Wound; Blood  Result Value Ref Range Status   Specimen Description WOUND LEFT BREAST  Final   Special Requests BLOOD HEMATOMA  Final   Gram Stain   Final    RARE WBC PRESENT, PREDOMINANTLY MONONUCLEAR NO ORGANISMS SEEN    Culture   Final    NO GROWTH < 24 HOURS Performed at [facility] [address] [place], [place] [address]    Report Status PENDING  Incomplete         Radiology Studies: No results found.      Scheduled Meds:  sodium chloride   Intravenous Once   atorvastatin  80 mg Oral QPM   calcium acetate  2,001 mg Oral TID WC   ceFAZolin (ANCEF) IM  1 g Intramuscular Q24H   Chlorhexidine Gluconate Cloth  6 each Topical Q0600   vitamin D3  1,000 Units Oral Daily   cinacalcet  30 mg Oral Q breakfast   docusate sodium  200 mg Oral BID   epoetin (EPOGEN/PROCRIT) injection  10,000 Units Intravenous Q M,W,F-HD   fluticasone  1 spray Each Nare Daily   gabapentin  100 mg Oral BID   insulin aspart  0-6 Units Subcutaneous TID WC   insulin aspart protamine- aspart  10 Units Subcutaneous BID WC   levETIRAcetam  1,000 mg Oral Daily   levETIRAcetam  250 mg Oral Once per day on Mon Wed Fri   losartan  100 mg Oral QHS   metoprolol succinate  100 mg Oral Q breakfast   pantoprazole  40 mg Oral Daily   polyethylene glycol  17 g Oral Daily   torsemide  100 mg Oral Q breakfast   Continuous Infusions:  sodium chloride 10 mL/hr at 03/25/22 9244   heparin 1,500 Units/hr (03/27/22 0428)     LOS: 7 days    Time spent: 35 mins     Nolberto Hanlon, MD [facility] Pager [phone]  [phone]  If 7PM-7AM, please contact night-coverage [email or web address] 03/27/2022, 1:33 PM

## 2022-03-27 NOTE — Progress Notes (Signed)
[facility]  ROUNDING NOTE   Subjective:   Cynthia Dean is a [age]-year-old female with past medical history including COPD, diabetes, carotid stenosis, hypertension, and end-stage renal disease on hemodialysis.  Patient presents to the emergency department with complaints of weakness, fatigue and fever.  Patient has been admitted for ESRD on hemodialysis ([place]) [N18.6, Z99.2] Acute pulmonary embolism without acute cor pulmonale ([place]) [I26.99] Sepsis with acute hypoxic respiratory failure and septic shock, due to unspecified organism ([place]) [A41.9, R65.21, J96.01]  Patient is known to our practice and receives outpatient dialysis treatments at [facility] on a TTS schedule, supervised by Dr. Candiss Norse.   Patient seen sitting up in bed, alert and oriented Patient did not have any complaints today   Objective:  Vital signs in last 24 hours:  Temp:  [98 F (36.7 C)-99.4 F (37.4 C)] 98 F (36.7 C) (09/03 0816) Pulse Rate:  [60-67] 60 (09/03 0816) Resp:  [16-19] 18 (09/03 0816) BP: (92-156)/(36-94) 124/62 (09/03 0816) SpO2:  [95 %-100 %] 95 % (09/03 0816)  Weight change:  Filed Weights   03/25/22 0342 03/25/22 1149 03/25/22 1621  Weight: 118.5 kg 116 kg 115 kg    Intake/Output: I/O last 3 completed shifts: In: 896.2 [P.O.:480; I.V.:366.2; IV Piggyback:50] Out: -    Intake/Output this shift:  No intake/output data recorded.  Physical Exam: General: NAD, resting comfortably  Head: Normocephalic, atraumatic. Moist oral mucosal membranes  Eyes: Anicteric  Lungs:  Clear to auscultation, normal effort  Heart: Regular rate and rhythm  Abdomen:  Soft, nontender, obese  Extremities: 1+ peripheral edema.  Neurologic: Alert, oriented to person  Skin: No lesions  Access: Left aVG    Basic Metabolic Panel: Recent Labs  Lab 03/21/22 0655 03/22/22 0156 03/23/22 0647 03/25/22 0523  NA 135 136 137 137  K 4.6 3.6 4.4 4.2  CL 101 98 101 101  CO2 '22  26 26 25  '$ GLUCOSE 84 91 101* 119*  BUN 60* 32* 38* 27*  CREATININE 12.47* 7.57* 9.31* 8.43*  CALCIUM 8.7* 8.7* 9.5 9.4     Liver Function Tests: Recent Labs  Lab 03/25/22 0523  AST 14*  ALT 8  ALKPHOS 73  BILITOT 0.7  PROT 6.1*  ALBUMIN 2.6*    No results for input(s): "LIPASE", "AMYLASE" in the last 168 hours. No results for input(s): "AMMONIA" in the last 168 hours.  CBC: Recent Labs  Lab 03/22/22 0156 03/23/22 0647 03/25/22 0523 03/26/22 0714 03/27/22 0447  WBC 9.7 10.4 10.9* 13.1* 11.9*  HGB 8.0* 7.5* 7.2* 8.3* 8.2*  HCT 25.3* 24.3* 23.3* 26.6* 26.4*  MCV 86.6 87.7 89.6 87.5 88.3  PLT 210 235 255 267 277     Cardiac Enzymes: No results for input(s): "CKTOTAL", "CKMB", "CKMBINDEX", "TROPONINI" in the last 168 hours.  BNP: Invalid input(s): "POCBNP"  CBG: Recent Labs  Lab 03/26/22 0755 03/26/22 1230 03/26/22 1642 03/26/22 2129 03/27/22 0818  GLUCAP 141* 130* 132* 107* 137*     Microbiology: Results for orders placed or performed during the hospital encounter of 03/19/22  Resp Panel by RT-PCR (Flu A&B, Covid) Anterior Nasal Swab     Status: None   Collection Time: 03/19/22  9:28 PM   Specimen: Anterior Nasal Swab  Result Value Ref Range Status   SARS Coronavirus 2 by RT PCR NEGATIVE NEGATIVE Final    Comment: (NOTE) SARS-CoV-2 target nucleic acids are NOT DETECTED.  The SARS-CoV-2 RNA is generally detectable in upper respiratory specimens during the acute  phase of infection. The lowest concentration of SARS-CoV-2 viral copies this assay can detect is 138 copies/mL. A negative result does not preclude SARS-Cov-2 infection and should not be used as the sole basis for treatment or other patient management decisions. A negative result may occur with  improper specimen collection/handling, submission of specimen other than nasopharyngeal swab, presence of viral mutation(s) within the areas targeted by this assay, and inadequate number of  viral copies(<138 copies/mL). A negative result must be combined with clinical observations, patient history, and epidemiological information. The expected result is Negative.  Fact Sheet for Patients:  EntrepreneurPulse.com.au  Fact Sheet for Healthcare Providers:  IncredibleEmployment.be  This test is no t yet approved or cleared by the [place] FDA and  has been authorized for detection and/or diagnosis of SARS-CoV-2 by FDA under an Emergency Use Authorization (EUA). This EUA will remain  in effect (meaning this test can be used) for the duration of the COVID-19 declaration under Section 564(b)(1) of the Act, 21 U.S.C.section 360bbb-3(b)(1), unless the authorization is terminated  or revoked sooner.       Influenza A by PCR NEGATIVE NEGATIVE Final   Influenza B by PCR NEGATIVE NEGATIVE Final    Comment: (NOTE) The Xpert Xpress SARS-CoV-2/FLU/RSV plus assay is intended as an aid in the diagnosis of influenza from Nasopharyngeal swab specimens and should not be used as a sole basis for treatment. Nasal washings and aspirates are unacceptable for Xpert Xpress SARS-CoV-2/FLU/RSV testing.  Fact Sheet for Patients: EntrepreneurPulse.com.au  Fact Sheet for Healthcare Providers: IncredibleEmployment.be  This test is not yet approved or cleared by the [place] FDA and has been authorized for detection and/or diagnosis of SARS-CoV-2 by FDA under an Emergency Use Authorization (EUA). This EUA will remain in effect (meaning this test can be used) for the duration of the COVID-19 declaration under Section 564(b)(1) of the Act, 21 U.S.C. section 360bbb-3(b)(1), unless the authorization is terminated or revoked.  Performed at [facility], [address] [place], [place] [address]   Blood Culture (routine x 2)     Status: Abnormal   Collection Time: 03/19/22  9:28 PM   Specimen: BLOOD   Result Value Ref Range Status   Specimen Description   Final    BLOOD RIGHT ANTECUBITAL Performed at [facility], [address] [place], [place] [address]    Special Requests   Final    BOTTLES DRAWN AEROBIC AND ANAEROBIC Blood Culture adequate volume Performed at [facility], [address] [place], [place] [address]    Culture  Setup Time   Final    GRAM POSITIVE COCCI IN BOTH AEROBIC AND ANAEROBIC BOTTLES CRITICAL RESULT CALLED TO, READ BACK BY AND VERIFIED WITH: CAROLYN CHILDS 03/20/22 1457 AMK Performed at [facility], [address] [place], [place] [address]    Culture STAPHYLOCOCCUS CAPITIS (A)  Final   Report Status 03/23/2022 FINAL  Final   Organism ID, Bacteria STAPHYLOCOCCUS CAPITIS  Final      Susceptibility   Staphylococcus capitis - MIC*    CIPROFLOXACIN <=0.5 SENSITIVE Sensitive     ERYTHROMYCIN <=0.25 SENSITIVE Sensitive     GENTAMICIN <=0.5 SENSITIVE Sensitive     OXACILLIN <=0.25 SENSITIVE Sensitive     TETRACYCLINE <=1 SENSITIVE Sensitive     VANCOMYCIN <=0.5 SENSITIVE Sensitive     TRIMETH/SULFA <=10 SENSITIVE Sensitive     CLINDAMYCIN <=0.25 SENSITIVE Sensitive     RIFAMPIN <=0.5 SENSITIVE Sensitive     Inducible Clindamycin NEGATIVE  Sensitive     * STAPHYLOCOCCUS CAPITIS  Blood Culture ID Panel (Reflexed)     Status: Abnormal   Collection Time: 03/19/22  9:28 PM  Result Value Ref Range Status   Enterococcus faecalis NOT DETECTED NOT DETECTED Final   Enterococcus Faecium NOT DETECTED NOT DETECTED Final   Listeria monocytogenes NOT DETECTED NOT DETECTED Final   Staphylococcus species DETECTED (A) NOT DETECTED Final    Comment: CRITICAL RESULT CALLED TO, READ BACK BY AND VERIFIED WITH: CAROLYN CHILDS 03/20/22 1457 AMK    Staphylococcus aureus (BCID) NOT DETECTED NOT DETECTED Final   Staphylococcus epidermidis NOT DETECTED NOT DETECTED Final   Staphylococcus lugdunensis NOT DETECTED NOT DETECTED Final   Streptococcus  species NOT DETECTED NOT DETECTED Final   Streptococcus agalactiae NOT DETECTED NOT DETECTED Final   Streptococcus pneumoniae NOT DETECTED NOT DETECTED Final   Streptococcus pyogenes NOT DETECTED NOT DETECTED Final   A.calcoaceticus-baumannii NOT DETECTED NOT DETECTED Final   Bacteroides fragilis NOT DETECTED NOT DETECTED Final   Enterobacterales NOT DETECTED NOT DETECTED Final   Enterobacter cloacae complex NOT DETECTED NOT DETECTED Final   Escherichia coli NOT DETECTED NOT DETECTED Final   Klebsiella aerogenes NOT DETECTED NOT DETECTED Final   Klebsiella oxytoca NOT DETECTED NOT DETECTED Final   Klebsiella pneumoniae NOT DETECTED NOT DETECTED Final   Proteus species NOT DETECTED NOT DETECTED Final   Salmonella species NOT DETECTED NOT DETECTED Final   Serratia marcescens NOT DETECTED NOT DETECTED Final   Haemophilus influenzae NOT DETECTED NOT DETECTED Final   Neisseria meningitidis NOT DETECTED NOT DETECTED Final   Pseudomonas aeruginosa NOT DETECTED NOT DETECTED Final   Stenotrophomonas maltophilia NOT DETECTED NOT DETECTED Final   Candida albicans NOT DETECTED NOT DETECTED Final   Candida auris NOT DETECTED NOT DETECTED Final   Candida glabrata NOT DETECTED NOT DETECTED Final   Candida krusei NOT DETECTED NOT DETECTED Final   Candida parapsilosis NOT DETECTED NOT DETECTED Final   Candida tropicalis NOT DETECTED NOT DETECTED Final   Cryptococcus neoformans/gattii NOT DETECTED NOT DETECTED Final    Comment: Performed at [facility], [address] [place], [place] [address]  Blood Culture (routine x 2)     Status: Abnormal   Collection Time: 03/19/22 10:23 PM   Specimen: BLOOD  Result Value Ref Range Status   Specimen Description   Final    BLOOD BLOOD RIGHT FOREARM Performed at [facility], [address], [place], [place] [address]    Special Requests   Final    BOTTLES DRAWN AEROBIC AND ANAEROBIC Blood Culture adequate volume Performed at [facility], [address] [place], [place] [address]    Culture  Setup Time   Final    GRAM POSITIVE COCCI IN BOTH AEROBIC AND ANAEROBIC BOTTLES CRITICAL RESULT CALLED TO, READ BACK BY AND VERIFIED WITH: ELVERA MADUEME 03/20/22 1545 AMK Performed at [facility], [address] [place], [place] [address]    Culture (A)  Final    STAPHYLOCOCCUS CAPITIS SUSCEPTIBILITIES PERFORMED ON PREVIOUS CULTURE WITHIN THE LAST 5 DAYS. Performed at [facility], [address], [place], [place] [address]    Report Status 03/22/2022 FINAL  Final  Urine Culture     Status: Abnormal   Collection Time: 03/20/22  1:25 AM   Specimen: In/Out Cath Urine  Result Value Ref Range Status   Specimen Description   Final    IN/OUT CATH URINE Performed at [facility], [address],  [place], [place] [address]    Special Requests   Final    NONE Performed at [facility], [address] [place], [place] [address]    Culture 4,000 COLONIES/mL STAPHYLOCOCCUS EPIDERMIDIS (A)  Final   Report Status 03/22/2022 FINAL  Final   Organism ID, Bacteria STAPHYLOCOCCUS EPIDERMIDIS (A)  Final      Susceptibility   Staphylococcus epidermidis - MIC*    CIPROFLOXACIN <=0.5 SENSITIVE Sensitive     GENTAMICIN <=0.5 SENSITIVE Sensitive     NITROFURANTOIN <=16 SENSITIVE Sensitive     OXACILLIN <=0.25 SENSITIVE Sensitive     TETRACYCLINE <=1 SENSITIVE Sensitive     VANCOMYCIN 2 SENSITIVE Sensitive     TRIMETH/SULFA 80 RESISTANT Resistant     CLINDAMYCIN >=8 RESISTANT Resistant     RIFAMPIN <=0.5 SENSITIVE Sensitive     Inducible Clindamycin NEGATIVE Sensitive     * 4,000 COLONIES/mL STAPHYLOCOCCUS EPIDERMIDIS  Culture, blood (Routine X 2) w Reflex to ID Panel     Status: None   Collection Time: 03/21/22 11:00 AM   Specimen: BLOOD  Result Value Ref Range Status   Specimen Description BLOOD BLOOD RIGHT FOREARM  Final   Special Requests   Final    BOTTLES DRAWN AEROBIC AND  ANAEROBIC Blood Culture adequate volume   Culture   Final    NO GROWTH 5 DAYS Performed at [facility], [address] [place], [place] [address]    Report Status 03/26/2022 FINAL  Final  Culture, blood (Routine X 2) w Reflex to ID Panel     Status: None   Collection Time: 03/22/22 11:53 AM   Specimen: BLOOD RIGHT HAND  Result Value Ref Range Status   Specimen Description BLOOD RIGHT HAND  Final   Special Requests   Final    BOTTLES DRAWN AEROBIC AND ANAEROBIC Blood Culture adequate volume   Culture   Final    NO GROWTH 5 DAYS Performed at [facility], [address], [place], [place] [address]    Report Status 03/27/2022 FINAL  Final  Aerobic/Anaerobic Culture w Gram Stain (surgical/deep wound)     Status: None (Preliminary result)   Collection Time: 03/25/22 10:30 AM   Specimen: Wound; Blood  Result Value Ref Range Status   Specimen Description WOUND LEFT BREAST  Final   Special Requests BLOOD HEMATOMA  Final   Gram Stain   Final    RARE WBC PRESENT, PREDOMINANTLY MONONUCLEAR NO ORGANISMS SEEN    Culture   Final    NO GROWTH < 24 HOURS Performed at [facility] [address] [place], [place] [address]    Report Status PENDING  Incomplete    Coagulation Studies: Recent Labs    03/24/22 1656  LABPROT 19.6*  INR 1.7*     Urinalysis: No results for input(s): "COLORURINE", "LABSPEC", "PHURINE", "GLUCOSEU", "HGBUR", "BILIRUBINUR", "KETONESUR", "PROTEINUR", "UROBILINOGEN", "NITRITE", "LEUKOCYTESUR" in the last 72 hours.  Invalid input(s): "APPERANCEUR"     Imaging: No results found.   Medications:    sodium chloride 10 mL/hr at 03/25/22 0620   heparin 1,500 Units/hr (03/27/22 0428)    sodium chloride   Intravenous Once   atorvastatin  80 mg Oral QPM   calcium acetate  2,001 mg Oral TID WC   ceFAZolin (ANCEF) IM  1 g Intramuscular Q24H   Chlorhexidine Gluconate Cloth  6 each Topical Q0600   vitamin D3  1,000 Units Oral Daily    cinacalcet  30 mg Oral Q breakfast  docusate sodium  200 mg Oral BID   epoetin (EPOGEN/PROCRIT) injection  10,000 Units Intravenous Q M,W,F-HD   fluticasone  1 spray Each Nare Daily   gabapentin  100 mg Oral BID   insulin aspart  0-6 Units Subcutaneous TID WC   insulin aspart protamine- aspart  10 Units Subcutaneous BID WC   levETIRAcetam  1,000 mg Oral Daily   levETIRAcetam  250 mg Oral Once per day on Mon Wed Fri   losartan  100 mg Oral QHS   metoprolol succinate  100 mg Oral Q breakfast   pantoprazole  40 mg Oral Daily   polyethylene glycol  17 g Oral Daily   torsemide  100 mg Oral Q breakfast   sodium chloride, acetaminophen **OR** acetaminophen, diphenoxylate-atropine, glucose, ipratropium-albuterol, lidocaine-prilocaine, magnesium hydroxide, metoCLOPramide (REGLAN) injection, oxyCODONE, traZODone  Assessment/ Plan:  Ms. Ailany Koren is a 71 y.o.  female ast medical history including COPD, diabetes, carotid stenosis, hypertension, and end-stage renal disease on hemodialysis.  Patient presents to the emergency department with complaints of weakness, fatigue and fever.  Patient has been admitted for ESRD on hemodialysis ([place]) [N18.6, Z99.2] Acute pulmonary embolism without acute cor pulmonale ([place]) [I26.99] Sepsis with acute hypoxic respiratory failure and septic shock, due to unspecified organism ([place]) [A41.9, R65.21, J96.01]  [facility]/MWF/left aVF  End-stage renal disease on hemodialysis.  Will maintain outpatient schedule if possible.  [name]  Next treatment scheduled for tomorrow- orders prepared. No acute indication for HD today.   2. Anemia of chronic kidney disease Lab Results  Component Value Date   HGB 8.2 (L) 03/27/2022    Patient receives [place] outpatient.  Hemoglobin continues to decrease.  Primary team has ordered 1 unit blood transfusion to be given during dialysis.  We will also continue EPO with treatments.  3. Secondary  Hyperparathyroidism: with outpatient labs: PTH 613, phosphorus 4.8, calcium 9.0 on 03/07/2022.   Lab Results  Component Value Date   PTH 131 (H) 03/14/2018   CALCIUM 9.4 03/25/2022   PHOS 3.0 10/25/2019  Bone minerals remain within acceptable range. Continue calcium acetate with meals.  4. Diabetes mellitus type II with chronic kidney disease: insulin dependent. Home regimen includes Novolin. Most recent hemoglobin A1c is 7.5 on 01/29/20.  Glucose appears stable.  Primary team to manage.  5.  Hypertension with chronic kidney disease.  Home regimen includes losartan, metoprolol, and torsemide.  Patient currently receiving these medications.  Blood pressure 124/62   LOS: 7 Rayli Wiederhold P Roann Merk 9/3/202310:47 AM

## 2022-03-27 NOTE — Progress Notes (Signed)
Physical Therapy Treatment Patient Details Name: Prisha Hiley MRN: [id number] DOB: 01/23/1951 Today's Date: 03/27/2022   History of Present Illness Braeley Bentli Llorente is a 71 y.o. 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    PT Comments    Pt making slow progress with functional mobility. She was able to complete bed mobility and transfers with less physical assistance than previous therapy session. In standing and with side steps at EOB, pt with consistent L knee buckling. Pt would continue to benefit from skilled physical therapy services at this time while admitted and after d/c to address the below listed limitations in order to improve overall safety and independence with functional mobility.   Recommendations for follow up therapy are one component of a multi-disciplinary discharge planning process, led by the attending physician.  Recommendations may be updated based on patient status, additional functional criteria and insurance authorization.  Follow Up Recommendations  Skilled nursing-short term rehab (<3 hours/day) Can patient physically be transported by private vehicle: No   Assistance Recommended at Discharge Frequent or constant Supervision/Assistance  Patient can return home with the following A lot of help with walking and/or transfers;A lot of help with bathing/dressing/bathroom;Assistance with cooking/housework;Help with stairs or ramp for entrance;Assist for transportation   Equipment Recommendations  Other (comment) (defer to next venue of care)    Recommendations for Other Services        Precautions / Restrictions Precautions Precautions: Fall Precaution Comments: watch L knee buckling Restrictions Weight Bearing Restrictions: No     Mobility  Bed Mobility Overal bed mobility: Needs Assistance Bed Mobility: Supine to Sit, Sit to Supine     Supine to sit: HOB elevated, Supervision Sit to supine: Supervision   General bed mobility comments: increased time and effort needed    Transfers Overall transfer level: Needs assistance Equipment used: Rolling walker (2 wheels) Transfers: Sit to/from Stand Sit to Stand: Min guard           General transfer comment: with initial stand from sitting EOB, pt with posterior LOB and sat back down at EOB. pt able to stand again without any physical assistance needed, cueing for postural correction for balance    Ambulation/Gait Ambulation/Gait assistance: Min guard, Min assist   Assistive device: Rolling walker (2 wheels)         General Gait Details: pt able to take side steps 3' bilaterally x2 with RW and min A-min guard assist from PT. pt with consistent L knee buckling and bracing her LEs against EOB   Stairs             Wheelchair Mobility    Modified Rankin (Stroke Patients Only)       Balance Overall balance assessment: Needs assistance Sitting-balance support: Feet supported, No upper extremity supported Sitting balance-Leahy Scale: Fair     Standing balance support: Bilateral upper extremity supported, During functional activity Standing balance-Leahy Scale: Poor                              Cognition Arousal/Alertness: Awake/alert Behavior During Therapy: Flat affect Overall Cognitive Status: Impaired/Different from baseline Area of Impairment: Following  commands, Safety/judgement, Problem solving                       Following Commands: Follows one step commands with increased time Safety/Judgement: Decreased awareness of safety, Decreased awareness of  deficits   Problem Solving: Slow processing, Decreased initiation, Difficulty sequencing, Requires verbal cues, Requires tactile cues          Exercises      General Comments        Pertinent Vitals/Pain Pain Assessment Pain Assessment: No/denies pain    Home Living                          Prior Function            PT Goals (current goals can now be found in the care plan section) Acute Rehab PT Goals PT Goal Formulation: With patient Time For Goal Achievement: 04/04/22 Potential to Achieve Goals: Fair Progress towards PT goals: Progressing toward goals    Frequency    Min 2X/week      PT Plan Current plan remains appropriate    Co-evaluation              AM-PAC PT "6 Clicks" Mobility   Outcome Measure  Help needed turning from your back to your side while in a flat bed without using bedrails?: A Little Help needed moving from lying on your back to sitting on the side of a flat bed without using bedrails?: A Little Help needed moving to and from a bed to a chair (including a wheelchair)?: A Lot Help needed standing up from a chair using your arms (e.g., wheelchair or bedside chair)?: A Little Help needed to walk in hospital room?: A Lot Help needed climbing 3-5 steps with a railing? : Total 6 Click Score: 14    End of Session Equipment Utilized During Treatment: Gait belt;Oxygen Activity Tolerance: Patient limited by fatigue Patient left: in bed;with call bell/phone within reach;with bed alarm set Nurse Communication: Mobility status PT Visit Diagnosis: Other abnormalities of gait and mobility (R26.89);Muscle weakness (generalized) (M62.81);Difficulty in walking, not elsewhere classified (R26.2)     Time: 3382-5053 PT Time Calculation (min) (ACUTE ONLY): 20 min  Charges:  $Therapeutic Activity: 8-22 mins                     Anastasio Champion, DPT  Acute Rehabilitation Services Office [place] 03/27/2022,  10:28 AM

## 2022-03-27 NOTE — Progress Notes (Signed)
ANTICOAGULATION CONSULT NOTE  Pharmacy Consult for IV heparin Indication: pulmonary embolus  Allergies  Allergen Reactions   Oxycodone Nausea And Vomiting   Oxycodone-Acetaminophen Other (See Comments)   Wound Dressing Adhesive    Hydrocodone     Intolerant more than allergic   Tape Itching    Skin Dermatitis/itching (tape adhesive) Skin Dermatitis/itching (tape adhesive)   Tapentadol Itching    Skin Dermatitis/itching (tape adhesive) Skin Dermatitis/itching (tape adhesive)     Patient Measurements: Height: '5\' 9"'$  (175.3 cm) Weight: 115 kg (253 lb 8.5 oz) IBW/kg (Calculated) : 66.2 Heparin Dosing Weight: 123 kg  Vital Signs: Temp: 98.8 F (37.1 C) (09/03 0416) Temp Source: Oral (09/03 0416) BP: 121/46 (09/03 0416) Pulse Rate: 62 (09/03 0416)  Labs: Recent Labs    03/24/22 1656 03/25/22 0523 03/25/22 0523 03/25/22 1331 03/26/22 0714 03/27/22 0447  HGB  --  7.2*   < >  --  8.3* 8.2*  HCT  --  23.3*  --   --  26.6* 26.4*  PLT  --  255  --   --  267 277  APTT  --  89*   < > 80* 84* 90*  LABPROT 19.6*  --   --   --   --   --   INR 1.7*  --   --   --   --   --   HEPARINUNFRC  --  >1.10*  --   --  >1.10* 0.77*  CREATININE  --  8.43*  --   --   --   --    < > = values in this interval not displayed.     Estimated Creatinine Clearance: 8.3 mL/min (A) (by C-G formula based on SCr of 8.43 mg/dL (H)).   Medications:  Medications Prior to Admission  Medication Sig Dispense Refill Last Dose   acetaminophen (TYLENOL) 500 MG tablet Take 500 mg by mouth every 6 (six) hours as needed.   prn at prn   aspirin EC 81 MG tablet Take 81 mg by mouth daily. Swallow whole.   Past Week   atorvastatin (LIPITOR) 80 MG tablet Take 80 mg by mouth every evening.    Past Week   calcium acetate (PHOSLO) 667 MG capsule Take 3 capsules (2,001 mg total) by mouth 3 (three) times daily with meals.   Past Week   cholecalciferol (VITAMIN D) 25 MCG tablet Take 1 tablet (1,000 Units total) by  mouth daily.   Past Week   cinacalcet (SENSIPAR) 30 MG tablet Take 30 mg by mouth daily at 6 (six) AM. Take with largest meal   Past Week   clopidogrel (PLAVIX) 75 MG tablet TAKE ONE TABLET BY MOUTH ONCE DAILY (Patient taking differently: Take 75 mg by mouth daily.) 30 tablet 5 Past Week   fluticasone (FLONASE) 50 MCG/ACT nasal spray Place 1 spray into both nostrils daily.   Past Week   gabapentin (NEURONTIN) 100 MG capsule Take 1 capsule (100 mg total) by mouth at bedtime. (Patient taking differently: Take 100 mg by mouth 2 (two) times daily.)   Past Week   glucose 4 GM chewable tablet Chew 1 tablet by mouth as needed for low blood sugar.   prn at prn   Ipratropium-Albuterol (COMBIVENT) 20-100 MCG/ACT AERS respimat Inhale 1 puff into the lungs every 6 (six) hours. (Patient taking differently: Inhale 1 puff into the lungs 2 (two) times daily.) 4 g 0 Past Week   levETIRAcetam (KEPPRA) 1000 MG tablet Take 1 tablet (1,000  mg total) by mouth daily. 30 tablet 0 Past Week   levETIRAcetam (KEPPRA) 250 MG tablet Take 250 mg by mouth 3 (three) times a week.   Past Week   lidocaine-prilocaine (EMLA) cream Apply 1 application topically as needed (for port access).    Past Week   losartan (COZAAR) 100 MG tablet Take 100 mg by mouth at bedtime.   Past Week   metoprolol succinate (TOPROL-XL) 100 MG 24 hr tablet Take 100 mg by mouth daily. Take with or immediately following a meal.    Past Week   NOVOLIN 70/30 (70-30) 100 UNIT/ML injection SMARTSIG:28 Unit(s) SUB-Q Twice Daily   Past Week   omeprazole (PRILOSEC) 20 MG capsule Take 20 mg by mouth 2 (two) times daily.    Past Week   torsemide (DEMADEX) 100 MG tablet Take 100 mg by mouth daily.   Past Week   diphenoxylate-atropine (LOMOTIL) 2.5-0.025 MG tablet SMARTSIG:1 Tablet(s) By Mouth Every 12 Hours PRN   prn at prn   loperamide (IMODIUM) 2 MG capsule Take 2 mg by mouth 4 (four) times daily as needed.   prn at prn   oxyCODONE (OXY IR/ROXICODONE) 5 MG immediate  release tablet Take 5 mg by mouth every 6 (six) hours as needed.   prn at prn   Scheduled:   sodium chloride   Intravenous Once   atorvastatin  80 mg Oral QPM   calcium acetate  2,001 mg Oral TID WC   ceFAZolin (ANCEF) IM  1 g Intramuscular Q24H   Chlorhexidine Gluconate Cloth  6 each Topical Q0600   vitamin D3  1,000 Units Oral Daily   cinacalcet  30 mg Oral Q breakfast   docusate sodium  200 mg Oral BID   epoetin (EPOGEN/PROCRIT) injection  10,000 Units Intravenous Q M,W,F-HD   fluticasone  1 spray Each Nare Daily   gabapentin  100 mg Oral BID   insulin aspart  0-6 Units Subcutaneous TID WC   insulin aspart protamine- aspart  10 Units Subcutaneous BID WC   levETIRAcetam  1,000 mg Oral Daily   levETIRAcetam  250 mg Oral Once per day on Mon Wed Fri   losartan  100 mg Oral QHS   metoprolol succinate  100 mg Oral Q breakfast   pantoprazole  40 mg Oral Daily   polyethylene glycol  17 g Oral Daily   torsemide  100 mg Oral Q breakfast   Infusions:   sodium chloride 10 mL/hr at 03/25/22 0620   heparin 1,500 Units/hr (03/27/22 0428)   PRN: sodium chloride, acetaminophen **OR** acetaminophen, diphenoxylate-atropine, glucose, ipratropium-albuterol, lidocaine-prilocaine, magnesium hydroxide, metoCLOPramide (REGLAN) injection, oxyCODONE, traZODone Anti-infectives (From admission, onward)    Start     Dose/Rate Route Frequency Ordered Stop   03/26/22 1800  ceFAZolin (ANCEF) injection 1 g        1 g Intramuscular Every 24 hours 03/26/22 1429     03/23/22 1800  ceFAZolin (ANCEF) IVPB 1 g/50 mL premix  Status:  Discontinued        1 g 100 mL/hr over 30 Minutes Intravenous Daily-1800 03/23/22 1243 03/26/22 1429   03/20/22 1615  doxycycline (VIBRAMYCIN) 100 mg in sodium chloride 0.9 % 250 mL IVPB  Status:  Discontinued        100 mg 125 mL/hr over 120 Minutes Intravenous Every 12 hours 03/20/22 1604 03/23/22 1242   03/20/22 1000  cefTRIAXone (ROCEPHIN) 2 g in sodium chloride 0.9 % 100 mL IVPB   Status:  Discontinued  2 g 200 mL/hr over 30 Minutes Intravenous Every 24 hours 03/20/22 0148 03/23/22 1242   03/20/22 0200  cefTRIAXone (ROCEPHIN) 2 g in sodium chloride 0.9 % 100 mL IVPB  Status:  Discontinued        2 g 200 mL/hr over 30 Minutes Intravenous Every 24 hours 03/20/22 0147 03/20/22 0148   03/19/22 2200  vancomycin (VANCOCIN) IVPB 1000 mg/200 mL premix        1,000 mg 200 mL/hr over 60 Minutes Intravenous  Once 03/19/22 2159 03/19/22 2329   03/19/22 2200  ceFEPIme (MAXIPIME) 2 g in sodium chloride 0.9 % 100 mL IVPB        2 g 200 mL/hr over 30 Minutes Intravenous  Once 03/19/22 2159 03/19/22 2322       Assessment: 71 year old female admitted with pulmonary embolism. Receiving procedure 9/1 to investigate CT scan.   Hgb low 2/2 CKD related anemia.  Last Eliquis dose 8/31 1000   Goal of Therapy:  Heparin level 0.3-0.7 units/ml Monitor platelets by anticoagulation protocol: Yes  Date Time aPTT/HL Rate/Comment 0901 0523 89 / >1.10 Therapeutic x 1 0901 1331 80 / ----  Therapeutic x 2 0902 1287 84/>1.10 Thera x 3 0903   0447   90/0.77             Therapeutic X 4   Plan:  09/03 @ 8676:  aPTT = 90, HL = 0.77 aPTT is therapeutic X 4,  HL still elevated from Eliquis  - Will continue pt on current rate and recheck HL and aPTT on 9/4 with AM labs.   Deandrea Vanpelt D Clinical Pharmacist  03/27/2022 6:08 AM

## 2022-03-27 NOTE — Progress Notes (Signed)
Subjective:  CC: Cynthia Dean is a 71 y.o. female  Hospital stay day 7,   left breast hematoma  HPI: No issues reported overnight.  No increasing pain or discharge from area.  ROS:  General: Denies weight loss, weight gain, fatigue, fevers, chills, and night sweats. Heart: Denies chest pain, palpitations, racing heart, irregular heartbeat, leg pain or swelling, and decreased activity tolerance. Respiratory: Denies breathing difficulty, shortness of breath, wheezing, cough, and sputum. GI: Denies change in appetite, heartburn, nausea, vomiting, constipation, diarrhea, and blood in stool. GU: Denies difficulty urinating, pain with urinating, urgency, frequency, blood in urine.   Objective:   Temp:  [98 F (36.7 C)-99.4 F (37.4 C)] 98 F (36.7 C) (09/03 0816) Pulse Rate:  [60-67] 60 (09/03 0816) Resp:  [16-19] 18 (09/03 0816) BP: (92-156)/(36-94) 124/62 (09/03 0816) SpO2:  [95 %-100 %] 95 % (09/03 0816)     Height: '5\' 9"'$  (175.3 cm) Weight: 115 kg BMI (Calculated): 37.42   Intake/Output this shift:   Intake/Output Summary (Last 24 hours) at 03/27/2022 1037 Last data filed at 03/27/2022 0351 Gross per 24 hour  Intake 656.22 ml  Output --  Net 656.22 ml    Constitutional :  alert, cooperative, appears stated age, and no distress  Respiratory:  clear to auscultation bilaterally  Cardiovascular:  regular rate and rhythm  Gastrointestinal: soft, non-tender; bowel sounds normal; no masses,  no organomegaly.   Skin: Cool and moist.  Chaperone present for exam.  Left breast dressing intact, soft, no TTP. No obvious erythema or drainage around the dressing sites.  Decreasing induration  Psychiatric: Normal affect, non-agitated, not confused       LABS:     Latest Ref Rng & Units 03/25/2022    5:23 AM 03/23/2022    6:47 AM 03/22/2022    1:56 AM  CMP  Glucose 70 - 99 mg/dL 119  101  91   BUN 8 - 23 mg/dL 27  38  32   Creatinine 0.44 - 1.00 mg/dL 8.43  9.31  7.57   Sodium 135  - 145 mmol/L 137  137  136   Potassium 3.5 - 5.1 mmol/L 4.2  4.4  3.6   Chloride 98 - 111 mmol/L 101  101  98   CO2 22 - 32 mmol/L '25  26  26   '$ Calcium 8.9 - 10.3 mg/dL 9.4  9.5  8.7   Total Protein 6.5 - 8.1 g/dL 6.1     Total Bilirubin 0.3 - 1.2 mg/dL 0.7     Alkaline Phos 38 - 126 U/L 73     AST 15 - 41 U/L 14     ALT 0 - 44 U/L 8         Latest Ref Rng & Units 03/27/2022    4:47 AM 03/26/2022    7:14 AM 03/25/2022    5:23 AM  CBC  WBC 4.0 - 10.5 K/uL 11.9  13.1  10.9   Hemoglobin 12.0 - 15.0 g/dL 8.2  8.3  7.2   Hematocrit 36.0 - 46.0 % 26.4  26.6  23.3   Platelets 150 - 400 K/uL 277  267  255     RADS: N/a Assessment:   Left breast hematoma, s/p IR aspiration.  No concerns for worsening issues on physical exam.  Okay to resume oral anticoagulation.  Band-Aid over the former aspiration site.  Axillary incision seems to be healed.  Further care per primary team  labs/images/medications/previous chart entries reviewed personally and  relevant changes/updates noted above.

## 2022-03-28 DIAGNOSIS — E785 Hyperlipidemia, unspecified: Secondary | ICD-10-CM | POA: Diagnosis not present

## 2022-03-28 DIAGNOSIS — R7881 Bacteremia: Secondary | ICD-10-CM | POA: Diagnosis not present

## 2022-03-28 DIAGNOSIS — D638 Anemia in other chronic diseases classified elsewhere: Secondary | ICD-10-CM | POA: Diagnosis not present

## 2022-03-28 DIAGNOSIS — I2699 Other pulmonary embolism without acute cor pulmonale: Secondary | ICD-10-CM | POA: Diagnosis not present

## 2022-03-28 LAB — HEMOGLOBIN AND HEMATOCRIT, BLOOD
HCT: 25.2 % — ABNORMAL LOW (ref 36.0–46.0)
Hemoglobin: 7.7 g/dL — ABNORMAL LOW (ref 12.0–15.0)

## 2022-03-28 LAB — GLUCOSE, CAPILLARY
Glucose-Capillary: 129 mg/dL — ABNORMAL HIGH (ref 70–99)
Glucose-Capillary: 140 mg/dL — ABNORMAL HIGH (ref 70–99)
Glucose-Capillary: 126 mg/dL — ABNORMAL HIGH (ref 70–99)

## 2022-03-28 MED ORDER — SODIUM CHLORIDE 0.9 % IV BOLUS
250.0000 mL | Freq: Once | INTRAVENOUS | Status: AC
  Administered 2022-03-28: 250 mL via INTRAVENOUS

## 2022-03-28 MED ORDER — ALBUMIN HUMAN 25 % IV SOLN
12.5000 g | Freq: Once | INTRAVENOUS | Status: AC
Start: 2022-03-28 — End: 2022-03-28
  Administered 2022-03-28: 12.5 g via INTRAVENOUS
  Filled 2022-03-28: qty 50

## 2022-03-28 MED ORDER — PENTAFLUOROPROP-TETRAFLUOROETH EX AERO
INHALATION_SPRAY | CUTANEOUS | Status: AC
  Filled 2022-03-28: qty 30

## 2022-03-28 MED ORDER — MIDODRINE HCL 5 MG PO TABS
5.0000 mg | ORAL_TABLET | Freq: Once | ORAL | Status: AC
  Administered 2022-03-28: 5 mg via ORAL
  Filled 2022-03-28: qty 1

## 2022-03-28 NOTE — Progress Notes (Addendum)
Rapid Response Event Note   Reason for Call : hypotensive   Initial Focused Assessment: On my arrival, pt is alert and can answer most orientation questions correctly. She says she feels fine and denies dizziness or chest pain. Pt's BP 86/32 with MAP of 50 in right lower leg. Unable to take BP in either arm d/t limb alerts. Primary RN states that 2 RNs checked manual pressure and got 60/40. Pt received HD today where they removed 1.5L. All other VSS.   Interventions: Neomia Glass, NP notified and arrived at bedside. Pt remains alert and oriented X3. Last BP 101/33 with MAP of 55.   Plan of Care: Neomia Glass, NP to order some albumin and pt will remain on 2A at this time. Rapid Response team will check back and follow up with patient. Primary RN aware to call with any issues or concerns.    Event Summary:   MD Notified: Neomia Glass, NP Call Time: 2019 Arrival Time: 2020 End Time: 2035  Trellis Paganini, RN

## 2022-03-28 NOTE — Progress Notes (Signed)
PROGRESS NOTE   HPI was taken from Dr. Sidney Ace: Cynthia Dean is a 71 y.o. African-American female with medical history significant for COPD, carotid stenosis, type 2 diabetes mellitus, end-stage renal disease on hemodialysis on MWF, hypertension and dyslipidemia, as well as invasive ductal carcinoma of the left breast status post surgical excision at [facility] recently, who presented to the emergency room with acute onset of generalized weakness and fatigue for the last couple of days.  She missed hemodialysis session yesterday.  She was fairly confused in the ER.  She has been having dyspnea with mild cough and occasional wheezing.  She admitted to fever and chills.  No chest pain or palpitations.  No nausea or vomiting or diarrhea or abdominal pain.  No bleeding diathesis.   ED Course: Upon presentation to the emergency room temperature was 102.9 with BP 139/57 heart rate 50 with respiratory to 23  with pulse oximetry of 87% on room air and 98% on 4 L of O2 by nasal cannula.  Labs revealed leukocytosis 16.5 with neutrophilia and hemoglobin of 8.1 with hematocrit 26.1 with platelets of 214.  Influenza antigens and COVID-19 PCR came back negative.  Blood cultures were drawn. EKG as reviewed by me : EKG showed sinus rhythm with rate of 81 with T wave inversion laterally. Imaging: Portable chest x-ray showed increasing vascular congestion without pulmonary edema.  Chest CTA revealed small pulmonary emboli within both lower lobes and the right upper lobe with no evidence for right heart strain.  It showed cardiomegaly and coronary artery disease with bibasal atelectasis and large masslike area in the left breast with surrounding soft tissue gas that corresponds with recent left lumpectomy.   The patient was given IV cefepime and heparin bolus and drip as well as 500 mL IV normal saline.  She will be admitted to a progressive unit bed for further evaluation and management.  As per Dr. Jimmye Norman  8/27-8/29/23: Pt presented w/ shortness of breath and was found to have acute pulmonary embolism w/o right heart strain. Pt was initially put on IV heparin drip and then changed to po eliquis. Echo is pending. Also, pt was admitted for mild breast cellulitis and has been on IV rocephin. Urine cx was growing staph epidermis and blood cxs growing stap captis for which pt has been on IV doxycycline. The blood cxs likely represent a containment. Repeat blood cxs NGTD. PT recs SNF.   8/31 LT breast US done.  9/1 s/p IR aspiration of Lt breast fluid collection 9/2 WBC up.  Spoke to Dr. Lysle Pearl recommended continuing heparin drip just in case she needs I&D 9/3 no overnight issues. H/h stable. 9/4 seen in dialysis today.  Reports being cold but has no other complaints  Roshawnda Pecora  NMM:[id number] DOB: 18-Jul-1951 DOA: 03/19/2022 PCP: Lowella Bandy, MD   Assessment & Plan:   Principal Problem:   Acute pulmonary embolism without acute cor pulmonale (HCC) Active Problems:   Sepsis due to cellulitis ([place])   ESRD on hemodialysis ([place])   Essential hypertension   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[place])   Dyslipidemia   Diabetic neuropathy (HCC)   Urinary tract infection   Anemia of chronic disease   Left breast abscess   Seroma of breast   Bacteremia due to coagulase-negative Staphylococcus  Assessment and Plan: Acute pulmonary embolism: without acute cor pulmonale. D/c IV heparin and start eliquis.  8/30 aspirin Plavix on hold  Echo with EF 50 to 55%.  Grade 2 diastolic dysfunction.  RV normal function and size  Holding home dose of aspirin, plavix (was not on plavix per pharm but will ck again) 1/02 RV systolic function and size normal.  EF 50 to 55% 9/3 spoke to surgery okay to transition to Eliquis from heparin drip  Pharmacy consulted 9/4 on Eliquis now       ACD: likely secondary to ESRD.  W/ possible component of acute blood loss anemia. S/p 1  unit of pRBCs transfused so far.  9/1 hg down to 7.2, spoke to pt and nephrology made aware. Pt agreeable to prbc transfusion , will give one unit in HD today.  No reports of bleed. 9/3 H&H stable  Status post 1 unit PRBC during dialysis on 9/1   continue to monitor  9/4 hemoglobin 7.7 taken during dialysis.  We will check H&H in a.m. if less than 7 will transfuse 1 unit       Sepsis: see Dr. Randel Books note on how pt met sepsis criteria. Likely secondary to left breast cellulitis. 8/31 on Rocephin  /4 resolved      Mild cellulitis of left breast:  On Rocephin...changed to cefazolin by Id 8/31 Lt breast US with seroma v.s. abscess. 9/1 s/p IR aspiration of hematoma. Cx sent 9/2 gsx following. Wbc up . Gsx rec. Continue heparin gtt incase needs I/d.  9/3 no worsening on exam. Okay to resume oral a/c per surgery.  9/4 clinically improving  Staph capitis bacteremia:  Likely from breast  ID following 9/2 repeat cultures negative to date   Possible UTI: urine cx staph epidermidis.  Was treated with iv doxy. Now on iv cefazolin for above    ESRD: on HD MWF.  Nephrology following    Constipation: continue on colace, miralax   Breast cancer: recent dx. S/p surgical excision at [facility] & chemo. High risk of blood clots w/ hx of cancer    Diabetic neuropathy: continue on home dose of gabapentin    HLD: continue on statin    DM2: likely poorly controlled. HbA1c ordered for AM. Continue on 70/30, SSI w/ accuchecks    HTN: continue on metoprolol, losartan           DVT prophylaxis: elqiuis Code Status:  full  Family Communication: None at bedside Disposition Plan: SNF  Level of care: Progressive  Status is: Inpatient Remains inpatient appropriate because: severity of illness, getting transfusion    Consultants:  Nephro   Procedures:  Antimicrobials: rocephin, doxycycline ..>cefazolin   Subjective: Denies shortness of breath or chest  pain  Objective: Vitals:   03/28/22 0010 03/28/22 0051 03/28/22 0437 03/28/22 0755  BP: (!) 83/48 (!) 139/41 112/72 (!) 131/44  Pulse: 63 66 (!) 59 63  Resp: '18 18 18 18  ' Temp: 98.7 F (37.1 C) 98.5 F (36.9 C) 98.3 F (36.8 C) 98 F (36.7 C)  TempSrc:  Oral  Oral  SpO2: 100% 100% 100% 91%  Weight:      Height:        Intake/Output Summary (Last 24 hours) at 03/28/2022 0810 Last data filed at 03/27/2022 1857 Gross per 24 hour  Intake 224.66 ml  Output --  Net 224.66 ml   Filed Weights   03/25/22 0342 03/25/22 1149 03/25/22 1621  Weight: 118.5 kg 116 kg 115 kg    Examination: Calm, NAD Cta no w/r Reg s1/s2 no gallop Soft benign +bs Mild lower extremity edema Awake and alert Mood and affect appropriate in current setting  Data Reviewed: I have personally reviewed following labs and imaging studies  CBC: Recent Labs  Lab 03/22/22 0156 03/23/22 0647 03/25/22 0523 03/26/22 0714 03/27/22 0447  WBC 9.7 10.4 10.9* 13.1* 11.9*  HGB 8.0* 7.5* 7.2* 8.3* 8.2*  HCT 25.3* 24.3* 23.3* 26.6* 26.4*  MCV 86.6 87.7 89.6 87.5 88.3  PLT 210 235 255 267 242   Basic Metabolic Panel: Recent Labs  Lab 03/22/22 0156 03/23/22 0647 03/25/22 0523  NA 136 137 137  K 3.6 4.4 4.2  CL 98 101 101  CO2 '26 26 25  ' GLUCOSE 91 101* 119*  BUN 32* 38* 27*  CREATININE 7.57* 9.31* 8.43*  CALCIUM 8.7* 9.5 9.4   GFR: Estimated Creatinine Clearance: 8.3 mL/min (A) (by C-G formula based on SCr of 8.43 mg/dL (H)). Liver Function Tests: Recent Labs  Lab 03/25/22 0523  AST 14*  ALT 8  ALKPHOS 73  BILITOT 0.7  PROT 6.1*  ALBUMIN 2.6*   No results for input(s): "LIPASE", "AMYLASE" in the last 168 hours. No results for input(s): "AMMONIA" in the last 168 hours. Coagulation Profile: Recent Labs  Lab 03/24/22 1656  INR 1.7*   Cardiac Enzymes: No results for input(s): "CKTOTAL", "CKMB", "CKMBINDEX", "TROPONINI" in the last 168 hours. BNP (last 3 results) No results for  input(s): "PROBNP" in the last 8760 hours. HbA1C: No results for input(s): "HGBA1C" in the last 72 hours.  CBG: Recent Labs  Lab 03/27/22 0818 03/27/22 1203 03/27/22 1728 03/27/22 2035 03/28/22 0755  GLUCAP 137* 160* 149* 172* 129*   Lipid Profile: No results for input(s): "CHOL", "HDL", "LDLCALC", "TRIG", "CHOLHDL", "LDLDIRECT" in the last 72 hours. Thyroid Function Tests: No results for input(s): "TSH", "T4TOTAL", "FREET4", "T3FREE", "THYROIDAB" in the last 72 hours. Anemia Panel: No results for input(s): "VITAMINB12", "FOLATE", "FERRITIN", "TIBC", "IRON", "RETICCTPCT" in the last 72 hours. Sepsis Labs: No results for input(s): "PROCALCITON", "LATICACIDVEN" in the last 168 hours.   Recent Results (from the past 240 hour(s))  Resp Panel by RT-PCR (Flu A&B, Covid) Anterior Nasal Swab     Status: None   Collection Time: 03/19/22  9:28 PM   Specimen: Anterior Nasal Swab  Result Value Ref Range Status   SARS Coronavirus 2 by RT PCR NEGATIVE NEGATIVE Final    Comment: (NOTE) SARS-CoV-2 target nucleic acids are NOT DETECTED.  The SARS-CoV-2 RNA is generally detectable in upper respiratory specimens during the acute phase of infection. The lowest concentration of SARS-CoV-2 viral copies this assay can detect is 138 copies/mL. A negative result does not preclude SARS-Cov-2 infection and should not be used as the sole basis for treatment or other patient management decisions. A negative result may occur with  improper specimen collection/handling, submission of specimen other than nasopharyngeal swab, presence of viral mutation(s) within the areas targeted by this assay, and inadequate number of viral copies(<138 copies/mL). A negative result must be combined with clinical observations, patient history, and epidemiological information. The expected result is Negative.  Fact Sheet for Patients:  EntrepreneurPulse.com.au  Fact Sheet for Healthcare Providers:   IncredibleEmployment.be  This test is no t yet approved or cleared by the [place] FDA and  has been authorized for detection and/or diagnosis of SARS-CoV-2 by FDA under an Emergency Use Authorization (EUA). This EUA will remain  in effect (meaning this test can be used) for the duration of the COVID-19 declaration under Section 564(b)(1) of the Act, 21 U.S.C.section 360bbb-3(b)(1), unless the authorization is terminated  or revoked sooner.  Influenza A by PCR NEGATIVE NEGATIVE Final   Influenza B by PCR NEGATIVE NEGATIVE Final    Comment: (NOTE) The Xpert Xpress SARS-CoV-2/FLU/RSV plus assay is intended as an aid in the diagnosis of influenza from Nasopharyngeal swab specimens and should not be used as a sole basis for treatment. Nasal washings and aspirates are unacceptable for Xpert Xpress SARS-CoV-2/FLU/RSV testing.  Fact Sheet for Patients: EntrepreneurPulse.com.au  Fact Sheet for Healthcare Providers: IncredibleEmployment.be  This test is not yet approved or cleared by the [place] FDA and has been authorized for detection and/or diagnosis of SARS-CoV-2 by FDA under an Emergency Use Authorization (EUA). This EUA will remain in effect (meaning this test can be used) for the duration of the COVID-19 declaration under Section 564(b)(1) of the Act, 21 U.S.C. section 360bbb-3(b)(1), unless the authorization is terminated or revoked.  Performed at [facility], [address] [place], [place] [address]   Blood Culture (routine x 2)     Status: Abnormal   Collection Time: 03/19/22  9:28 PM   Specimen: BLOOD  Result Value Ref Range Status   Specimen Description   Final    BLOOD RIGHT ANTECUBITAL Performed at [facility], [address] [place], [place] [address]    Special Requests   Final    BOTTLES DRAWN AEROBIC AND ANAEROBIC Blood Culture adequate volume Performed at [facility], [place], [place] [address]    Culture  Setup Time   Final    GRAM POSITIVE COCCI IN BOTH AEROBIC AND ANAEROBIC BOTTLES CRITICAL RESULT CALLED TO, READ BACK BY AND VERIFIED WITH: CAROLYN CHILDS 03/20/22 1457 AMK Performed at [facility], [address] [place], [place] [address]    Culture STAPHYLOCOCCUS CAPITIS (A)  Final   Report Status 03/23/2022 FINAL  Final   Organism ID, Bacteria STAPHYLOCOCCUS CAPITIS  Final      Susceptibility   Staphylococcus capitis - MIC*    CIPROFLOXACIN <=0.5 SENSITIVE Sensitive     ERYTHROMYCIN <=0.25 SENSITIVE Sensitive     GENTAMICIN <=0.5 SENSITIVE Sensitive     OXACILLIN <=0.25 SENSITIVE Sensitive     TETRACYCLINE <=1 SENSITIVE Sensitive     VANCOMYCIN <=0.5 SENSITIVE Sensitive     TRIMETH/SULFA <=10 SENSITIVE Sensitive     CLINDAMYCIN <=0.25 SENSITIVE Sensitive     RIFAMPIN <=0.5 SENSITIVE Sensitive     Inducible Clindamycin NEGATIVE Sensitive     * STAPHYLOCOCCUS CAPITIS  Blood Culture ID Panel (Reflexed)     Status: Abnormal   Collection Time: 03/19/22  9:28 PM  Result Value Ref Range Status   Enterococcus faecalis NOT DETECTED NOT DETECTED Final   Enterococcus Faecium NOT DETECTED NOT DETECTED Final   Listeria monocytogenes NOT DETECTED NOT DETECTED Final   Staphylococcus species DETECTED (A) NOT DETECTED Final    Comment: CRITICAL RESULT CALLED TO, READ BACK BY AND VERIFIED WITH: CAROLYN CHILDS 03/20/22 1457 AMK    Staphylococcus aureus (BCID) NOT DETECTED NOT DETECTED Final   Staphylococcus epidermidis NOT DETECTED NOT DETECTED Final   Staphylococcus lugdunensis NOT DETECTED NOT DETECTED Final   Streptococcus species NOT DETECTED NOT DETECTED Final   Streptococcus agalactiae NOT DETECTED NOT DETECTED Final   Streptococcus pneumoniae NOT DETECTED NOT DETECTED Final   Streptococcus pyogenes NOT DETECTED NOT DETECTED Final   A.calcoaceticus-baumannii NOT DETECTED NOT DETECTED Final   Bacteroides  fragilis NOT DETECTED NOT DETECTED Final   Enterobacterales NOT DETECTED NOT DETECTED Final   Enterobacter cloacae complex NOT DETECTED NOT DETECTED  Final   Escherichia coli NOT DETECTED NOT DETECTED Final   Klebsiella aerogenes NOT DETECTED NOT DETECTED Final   Klebsiella oxytoca NOT DETECTED NOT DETECTED Final   Klebsiella pneumoniae NOT DETECTED NOT DETECTED Final   Proteus species NOT DETECTED NOT DETECTED Final   Salmonella species NOT DETECTED NOT DETECTED Final   Serratia marcescens NOT DETECTED NOT DETECTED Final   Haemophilus influenzae NOT DETECTED NOT DETECTED Final   Neisseria meningitidis NOT DETECTED NOT DETECTED Final   Pseudomonas aeruginosa NOT DETECTED NOT DETECTED Final   Stenotrophomonas maltophilia NOT DETECTED NOT DETECTED Final   Candida albicans NOT DETECTED NOT DETECTED Final   Candida auris NOT DETECTED NOT DETECTED Final   Candida glabrata NOT DETECTED NOT DETECTED Final   Candida krusei NOT DETECTED NOT DETECTED Final   Candida parapsilosis NOT DETECTED NOT DETECTED Final   Candida tropicalis NOT DETECTED NOT DETECTED Final   Cryptococcus neoformans/gattii NOT DETECTED NOT DETECTED Final    Comment: Performed at [facility], [address] [place], [place] [address]  Blood Culture (routine x 2)     Status: Abnormal   Collection Time: 03/19/22 10:23 PM   Specimen: BLOOD  Result Value Ref Range Status   Specimen Description   Final    BLOOD BLOOD RIGHT FOREARM Performed at [facility], [address], [place], [place] [address]    Special Requests   Final    BOTTLES DRAWN AEROBIC AND ANAEROBIC Blood Culture adequate volume Performed at [facility], [address] [place], [place] [address]    Culture  Setup Time   Final    GRAM POSITIVE COCCI IN BOTH AEROBIC AND ANAEROBIC BOTTLES CRITICAL RESULT CALLED TO, READ BACK BY AND VERIFIED WITH: ELVERA MADUEME 03/20/22 1545 AMK Performed at [facility], [address] [place], [place] [address]    Culture (A)  Final    STAPHYLOCOCCUS CAPITIS SUSCEPTIBILITIES PERFORMED ON PREVIOUS CULTURE WITHIN THE LAST 5 DAYS. Performed at [facility], [place] [address], [place], [place] [address]    Report Status 03/22/2022 FINAL  Final  Urine Culture     Status: Abnormal   Collection Time: 03/20/22  1:25 AM   Specimen: In/Out Cath Urine  Result Value Ref Range Status   Specimen Description   Final    IN/OUT CATH URINE Performed at [facility], [address], [place], [place] [address]    Special Requests   Final    NONE Performed at [facility], [place], [place] [address]    Culture 4,000 COLONIES/mL STAPHYLOCOCCUS EPIDERMIDIS (A)  Final   Report Status 03/22/2022 FINAL  Final   Organism ID, Bacteria STAPHYLOCOCCUS EPIDERMIDIS (A)  Final      Susceptibility   Staphylococcus epidermidis - MIC*    CIPROFLOXACIN <=0.5 SENSITIVE Sensitive     GENTAMICIN <=0.5 SENSITIVE Sensitive     NITROFURANTOIN <=16 SENSITIVE Sensitive     OXACILLIN <=0.25 SENSITIVE Sensitive     TETRACYCLINE <=1 SENSITIVE Sensitive     VANCOMYCIN 2 SENSITIVE Sensitive     TRIMETH/SULFA 80 RESISTANT Resistant     CLINDAMYCIN >=8 RESISTANT Resistant     RIFAMPIN <=0.5 SENSITIVE Sensitive     Inducible Clindamycin NEGATIVE Sensitive     * 4,000 COLONIES/mL STAPHYLOCOCCUS EPIDERMIDIS  Culture, blood (Routine X 2) w Reflex to ID Panel     Status: None   Collection Time: 03/21/22 11:00 AM   Specimen: BLOOD  Result Value Ref Range  Status   Specimen Description BLOOD BLOOD RIGHT FOREARM  Final   Special Requests   Final    BOTTLES DRAWN AEROBIC AND ANAEROBIC Blood Culture adequate volume   Culture   Final    NO GROWTH 5 DAYS Performed at [facility], [address] [place], [place] [address]    Report Status 03/26/2022 FINAL  Final  Culture, blood (Routine X 2) w Reflex to ID Panel     Status: None   Collection Time: 03/22/22  11:53 AM   Specimen: BLOOD RIGHT HAND  Result Value Ref Range Status   Specimen Description BLOOD RIGHT HAND  Final   Special Requests   Final    BOTTLES DRAWN AEROBIC AND ANAEROBIC Blood Culture adequate volume   Culture   Final    NO GROWTH 5 DAYS Performed at [facility], [address], [place], [place] [address]    Report Status 03/27/2022 FINAL  Final  Aerobic/Anaerobic Culture w Gram Stain (surgical/deep wound)     Status: None (Preliminary result)   Collection Time: 03/25/22 10:30 AM   Specimen: Wound; Blood  Result Value Ref Range Status   Specimen Description WOUND LEFT BREAST  Final   Special Requests BLOOD HEMATOMA  Final   Gram Stain   Final    RARE WBC PRESENT, PREDOMINANTLY MONONUCLEAR NO ORGANISMS SEEN    Culture   Final    NO GROWTH 2 DAYS NO ANAEROBES ISOLATED; CULTURE IN PROGRESS FOR 5 DAYS Performed at [facility], [place] [address] [place], [place] [address]    Report Status PENDING  Incomplete         Radiology Studies: No results found.      Scheduled Meds:  sodium chloride   Intravenous Once   apixaban  10 mg Oral BID   Followed by   Derrill Memo ON 04/03/2022] apixaban  5 mg Oral BID   atorvastatin  80 mg Oral QPM   calcium acetate  2,001 mg Oral TID WC   Chlorhexidine Gluconate Cloth  6 each Topical Q0600   vitamin D3  1,000 Units Oral Daily   cinacalcet  30 mg Oral Q breakfast   docusate sodium  200 mg Oral BID   epoetin (EPOGEN/PROCRIT) injection  10,000 Units Intravenous Q M,W,F-HD   fluticasone  1 spray Each Nare Daily   gabapentin  100 mg Oral BID   insulin aspart  0-6 Units Subcutaneous TID WC   insulin aspart protamine- aspart  10 Units Subcutaneous BID WC   levETIRAcetam  1,000 mg Oral Daily   levETIRAcetam  250 mg Oral Once per day on Mon Wed Fri   losartan  100 mg Oral QHS   metoprolol succinate  100 mg Oral Q breakfast   pantoprazole  40 mg Oral Daily   polyethylene glycol  17 g Oral Daily   torsemide  100 mg  Oral Q breakfast   Continuous Infusions:  sodium chloride 10 mL/hr at 03/25/22 6503    ceFAZolin (ANCEF) IV Stopped (03/27/22 1831)     LOS: 8 days    Time spent: 35 mins     Nolberto Hanlon, MD [facility] Pager [phone]  If 7PM-7AM, please contact night-coverage [email or web address] 03/28/2022, 8:10 AM

## 2022-03-28 NOTE — Progress Notes (Signed)
[facility]  ROUNDING NOTE   Subjective:   Cynthia Dean is a [age]-year-old female with past medical history including COPD, diabetes, carotid stenosis, hypertension, and end-stage renal disease on hemodialysis.  Patient presents to the emergency department with complaints of weakness, fatigue and fever.  Patient has been admitted for ESRD on hemodialysis ([place]) [N18.6, Z99.2] Acute pulmonary embolism without acute cor pulmonale ([place]) [I26.99] Sepsis with acute hypoxic respiratory failure and septic shock, due to unspecified organism ([place]) [A41.9, R65.21, J96.01]  Patient is known to our practice and receives outpatient dialysis treatments at [facility] on a TTS schedule, supervised by Dr. Candiss Norse.   Patient seen and evaluated during HD.   Tolerating well.     Objective:  Vital signs in last 24 hours:  Temp:  [98 F (36.7 C)-98.7 F (37.1 C)] 98.5 F (36.9 C) (09/04 1000) Pulse Rate:  [59-66] 65 (09/04 1200) Resp:  [13-18] 16 (09/04 1200) BP: (83-147)/(41-72) 108/53 (09/04 1200) SpO2:  [91 %-100 %] 100 % (09/04 1200)  Weight change:  Filed Weights   03/25/22 0342 03/25/22 1149 03/25/22 1621  Weight: 118.5 kg 116 kg 115 kg    Intake/Output: I/O last 3 completed shifts: In: 369.5 [I.V.:319.5; IV Piggyback:50] Out: -    Intake/Output this shift:  No intake/output data recorded.  Physical Exam: General: NAD, resting comfortably  Head: Normocephalic, atraumatic. Moist oral mucosal membranes  Eyes: Anicteric  Lungs:  Clear to auscultation, normal effort  Heart: Regular rate and rhythm  Abdomen:  Soft, nontender, obese  Extremities: trace peripheral edema.  Neurologic: Alert, oriented to person  Skin: No lesions  Access: Left aVG    Basic Metabolic Panel: Recent Labs  Lab 03/22/22 0156 03/23/22 0647 03/25/22 0523  NA 136 137 137  K 3.6 4.4 4.2  CL 98 101 101  CO2 '26 26 25  '$ GLUCOSE 91 101* 119*  BUN 32* 38* 27*  CREATININE  7.57* 9.31* 8.43*  CALCIUM 8.7* 9.5 9.4     Liver Function Tests: Recent Labs  Lab 03/25/22 0523  AST 14*  ALT 8  ALKPHOS 73  BILITOT 0.7  PROT 6.1*  ALBUMIN 2.6*    No results for input(s): "LIPASE", "AMYLASE" in the last 168 hours. No results for input(s): "AMMONIA" in the last 168 hours.  CBC: Recent Labs  Lab 03/22/22 0156 03/23/22 0647 03/25/22 0523 03/26/22 0714 03/27/22 0447 03/28/22 1000  WBC 9.7 10.4 10.9* 13.1* 11.9*  --   HGB 8.0* 7.5* 7.2* 8.3* 8.2* 7.7*  HCT 25.3* 24.3* 23.3* 26.6* 26.4* 25.2*  MCV 86.6 87.7 89.6 87.5 88.3  --   PLT 210 235 255 267 277  --      Cardiac Enzymes: No results for input(s): "CKTOTAL", "CKMB", "CKMBINDEX", "TROPONINI" in the last 168 hours.  BNP: Invalid input(s): "POCBNP"  CBG: Recent Labs  Lab 03/27/22 0818 03/27/22 1203 03/27/22 1728 03/27/22 2035 03/28/22 0755  GLUCAP 137* 160* 149* 172* 129*     Microbiology: Results for orders placed or performed during the hospital encounter of 03/19/22  Resp Panel by RT-PCR (Flu A&B, Covid) Anterior Nasal Swab     Status: None   Collection Time: 03/19/22  9:28 PM   Specimen: Anterior Nasal Swab  Result Value Ref Range Status   SARS Coronavirus 2 by RT PCR NEGATIVE NEGATIVE Final    Comment: (NOTE) SARS-CoV-2 target nucleic acids are NOT DETECTED.  The SARS-CoV-2 RNA is generally detectable in upper respiratory specimens during the acute phase of  infection. The lowest concentration of SARS-CoV-2 viral copies this assay can detect is 138 copies/mL. A negative result does not preclude SARS-Cov-2 infection and should not be used as the sole basis for treatment or other patient management decisions. A negative result may occur with  improper specimen collection/handling, submission of specimen other than nasopharyngeal swab, presence of viral mutation(s) within the areas targeted by this assay, and inadequate number of viral copies(<138 copies/mL). A negative result  must be combined with clinical observations, patient history, and epidemiological information. The expected result is Negative.  Fact Sheet for Patients:  EntrepreneurPulse.com.au  Fact Sheet for Healthcare Providers:  IncredibleEmployment.be  This test is no t yet approved or cleared by the [place] FDA and  has been authorized for detection and/or diagnosis of SARS-CoV-2 by FDA under an Emergency Use Authorization (EUA). This EUA will remain  in effect (meaning this test can be used) for the duration of the COVID-19 declaration under Section 564(b)(1) of the Act, 21 U.S.C.section 360bbb-3(b)(1), unless the authorization is terminated  or revoked sooner.       Influenza A by PCR NEGATIVE NEGATIVE Final   Influenza B by PCR NEGATIVE NEGATIVE Final    Comment: (NOTE) The Xpert Xpress SARS-CoV-2/FLU/RSV plus assay is intended as an aid in the diagnosis of influenza from Nasopharyngeal swab specimens and should not be used as a sole basis for treatment. Nasal washings and aspirates are unacceptable for Xpert Xpress SARS-CoV-2/FLU/RSV testing.  Fact Sheet for Patients: EntrepreneurPulse.com.au  Fact Sheet for Healthcare Providers: IncredibleEmployment.be  This test is not yet approved or cleared by the [place] FDA and has been authorized for detection and/or diagnosis of SARS-CoV-2 by FDA under an Emergency Use Authorization (EUA). This EUA will remain in effect (meaning this test can be used) for the duration of the COVID-19 declaration under Section 564(b)(1) of the Act, 21 U.S.C. section 360bbb-3(b)(1), unless the authorization is terminated or revoked.  Performed at [facility], [address] [place], [place] [address]   Blood Culture (routine x 2)     Status: Abnormal   Collection Time: 03/19/22  9:28 PM   Specimen: BLOOD  Result Value Ref Range Status   Specimen  Description   Final    BLOOD RIGHT ANTECUBITAL Performed at [facility], [address] [place], [place] [address]    Special Requests   Final    BOTTLES DRAWN AEROBIC AND ANAEROBIC Blood Culture adequate volume Performed at [facility], [address] [place], [place] [address]    Culture  Setup Time   Final    GRAM POSITIVE COCCI IN BOTH AEROBIC AND ANAEROBIC BOTTLES CRITICAL RESULT CALLED TO, READ BACK BY AND VERIFIED WITH: CAROLYN CHILDS 03/20/22 1457 AMK Performed at [facility], [address] [place],  [address]    Culture STAPHYLOCOCCUS CAPITIS (A)  Final   Report Status 03/23/2022 FINAL  Final   Organism ID, Bacteria STAPHYLOCOCCUS CAPITIS  Final      Susceptibility   Staphylococcus capitis - MIC*    CIPROFLOXACIN <=0.5 SENSITIVE Sensitive     ERYTHROMYCIN <=0.25 SENSITIVE Sensitive     GENTAMICIN <=0.5 SENSITIVE Sensitive     OXACILLIN <=0.25 SENSITIVE Sensitive     TETRACYCLINE <=1 SENSITIVE Sensitive     VANCOMYCIN <=0.5 SENSITIVE Sensitive     TRIMETH/SULFA <=10 SENSITIVE Sensitive     CLINDAMYCIN <=0.25 SENSITIVE Sensitive     RIFAMPIN <=0.5 SENSITIVE Sensitive     Inducible Clindamycin NEGATIVE Sensitive     *  STAPHYLOCOCCUS CAPITIS  Blood Culture ID Panel (Reflexed)     Status: Abnormal   Collection Time: 03/19/22  9:28 PM  Result Value Ref Range Status   Enterococcus faecalis NOT DETECTED NOT DETECTED Final   Enterococcus Faecium NOT DETECTED NOT DETECTED Final   Listeria monocytogenes NOT DETECTED NOT DETECTED Final   Staphylococcus species DETECTED (A) NOT DETECTED Final    Comment: CRITICAL RESULT CALLED TO, READ BACK BY AND VERIFIED WITH: CAROLYN CHILDS 03/20/22 1457 AMK    Staphylococcus aureus (BCID) NOT DETECTED NOT DETECTED Final   Staphylococcus epidermidis NOT DETECTED NOT DETECTED Final   Staphylococcus lugdunensis NOT DETECTED NOT DETECTED Final   Streptococcus species NOT DETECTED NOT DETECTED Final    Streptococcus agalactiae NOT DETECTED NOT DETECTED Final   Streptococcus pneumoniae NOT DETECTED NOT DETECTED Final   Streptococcus pyogenes NOT DETECTED NOT DETECTED Final   A.calcoaceticus-baumannii NOT DETECTED NOT DETECTED Final   Bacteroides fragilis NOT DETECTED NOT DETECTED Final   Enterobacterales NOT DETECTED NOT DETECTED Final   Enterobacter cloacae complex NOT DETECTED NOT DETECTED Final   Escherichia coli NOT DETECTED NOT DETECTED Final   Klebsiella aerogenes NOT DETECTED NOT DETECTED Final   Klebsiella oxytoca NOT DETECTED NOT DETECTED Final   Klebsiella pneumoniae NOT DETECTED NOT DETECTED Final   Proteus species NOT DETECTED NOT DETECTED Final   Salmonella species NOT DETECTED NOT DETECTED Final   Serratia marcescens NOT DETECTED NOT DETECTED Final   Haemophilus influenzae NOT DETECTED NOT DETECTED Final   Neisseria meningitidis NOT DETECTED NOT DETECTED Final   Pseudomonas aeruginosa NOT DETECTED NOT DETECTED Final   Stenotrophomonas maltophilia NOT DETECTED NOT DETECTED Final   Candida albicans NOT DETECTED NOT DETECTED Final   Candida auris NOT DETECTED NOT DETECTED Final   Candida glabrata NOT DETECTED NOT DETECTED Final   Candida krusei NOT DETECTED NOT DETECTED Final   Candida parapsilosis NOT DETECTED NOT DETECTED Final   Candida tropicalis NOT DETECTED NOT DETECTED Final   Cryptococcus neoformans/gattii NOT DETECTED NOT DETECTED Final    Comment: Performed at [facility], [address] [place], [place] [address]  Blood Culture (routine x 2)     Status: Abnormal   Collection Time: 03/19/22 10:23 PM   Specimen: BLOOD  Result Value Ref Range Status   Specimen Description   Final    BLOOD BLOOD RIGHT FOREARM Performed at [facility], [address] [place], [name] [address]    Special Requests   Final    BOTTLES DRAWN AEROBIC AND ANAEROBIC Blood Culture adequate volume Performed at [facility], [address]  [place], [place] [address]    Culture  Setup Time   Final    GRAM POSITIVE COCCI IN BOTH AEROBIC AND ANAEROBIC BOTTLES CRITICAL RESULT CALLED TO, READ BACK BY AND VERIFIED WITH: ELVERA MADUEME 03/20/22 1545 AMK Performed at [facility], [address] [place], [place] [address]    Culture (A)  Final    STAPHYLOCOCCUS CAPITIS SUSCEPTIBILITIES PERFORMED ON PREVIOUS CULTURE WITHIN THE LAST 5 DAYS. Performed at [facility], [place] [address], [place], [place] [address]    Report Status 03/22/2022 FINAL  Final  Urine Culture     Status: Abnormal   Collection Time: 03/20/22  1:25 AM   Specimen: In/Out Cath Urine  Result Value Ref Range Status   Specimen Description   Final    IN/OUT CATH URINE Performed at [facility], [address], [place], [place] [address]  Special Requests   Final    NONE Performed at [facility], [address] [place], [place] [address]    Culture 4,000 COLONIES/mL STAPHYLOCOCCUS EPIDERMIDIS (A)  Final   Report Status 03/22/2022 FINAL  Final   Organism ID, Bacteria STAPHYLOCOCCUS EPIDERMIDIS (A)  Final      Susceptibility   Staphylococcus epidermidis - MIC*    CIPROFLOXACIN <=0.5 SENSITIVE Sensitive     GENTAMICIN <=0.5 SENSITIVE Sensitive     NITROFURANTOIN <=16 SENSITIVE Sensitive     OXACILLIN <=0.25 SENSITIVE Sensitive     TETRACYCLINE <=1 SENSITIVE Sensitive     VANCOMYCIN 2 SENSITIVE Sensitive     TRIMETH/SULFA 80 RESISTANT Resistant     CLINDAMYCIN >=8 RESISTANT Resistant     RIFAMPIN <=0.5 SENSITIVE Sensitive     Inducible Clindamycin NEGATIVE Sensitive     * 4,000 COLONIES/mL STAPHYLOCOCCUS EPIDERMIDIS  Culture, blood (Routine X 2) w Reflex to ID Panel     Status: None   Collection Time: 03/21/22 11:00 AM   Specimen: BLOOD  Result Value Ref Range Status   Specimen Description BLOOD BLOOD RIGHT FOREARM  Final   Special Requests   Final    BOTTLES DRAWN AEROBIC AND ANAEROBIC Blood Culture adequate volume    Culture   Final    NO GROWTH 5 DAYS Performed at [facility], [address] [place], [place] [address]    Report Status 03/26/2022 FINAL  Final  Culture, blood (Routine X 2) w Reflex to ID Panel     Status: None   Collection Time: 03/22/22 11:53 AM   Specimen: BLOOD RIGHT HAND  Result Value Ref Range Status   Specimen Description BLOOD RIGHT HAND  Final   Special Requests   Final    BOTTLES DRAWN AEROBIC AND ANAEROBIC Blood Culture adequate volume   Culture   Final    NO GROWTH 5 DAYS Performed at [facility], [address] [place], [place] [address]    Report Status 03/27/2022 FINAL  Final  Aerobic/Anaerobic Culture w Gram Stain (surgical/deep wound)     Status: None (Preliminary result)   Collection Time: 03/25/22 10:30 AM   Specimen: Wound; Blood  Result Value Ref Range Status   Specimen Description WOUND LEFT BREAST  Final   Special Requests BLOOD HEMATOMA  Final   Gram Stain   Final    RARE WBC PRESENT, PREDOMINANTLY MONONUCLEAR NO ORGANISMS SEEN    Culture   Final    NO GROWTH 3 DAYS NO ANAEROBES ISOLATED; CULTURE IN PROGRESS FOR 5 DAYS Performed at [facility], [place] [address], [place], [place] [address]    Report Status PENDING  Incomplete    Coagulation Studies: No results for input(s): "LABPROT", "INR" in the last 72 hours.   Urinalysis: No results for input(s): "COLORURINE", "LABSPEC", "PHURINE", "GLUCOSEU", "HGBUR", "BILIRUBINUR", "KETONESUR", "PROTEINUR", "UROBILINOGEN", "NITRITE", "LEUKOCYTESUR" in the last 72 hours.  Invalid input(s): "APPERANCEUR"     Imaging: No results found.   Medications:    sodium chloride 10 mL/hr at 03/25/22 0620    ceFAZolin (ANCEF) IV Stopped (03/27/22 1831)    sodium chloride   Intravenous Once   apixaban  10 mg Oral BID   Followed by   Derrill Memo ON 04/03/2022] apixaban  5 mg Oral BID   atorvastatin  80 mg Oral QPM   calcium acetate  2,001 mg Oral TID WC   Chlorhexidine Gluconate Cloth   6 each Topical Q0600   vitamin D3  1,000 Units  Oral Daily   cinacalcet  30 mg Oral Q breakfast   docusate sodium  200 mg Oral BID   epoetin (EPOGEN/PROCRIT) injection  10,000 Units Intravenous Q M,W,F-HD   fluticasone  1 spray Each Nare Daily   gabapentin  100 mg Oral BID   insulin aspart  0-6 Units Subcutaneous TID WC   insulin aspart protamine- aspart  10 Units Subcutaneous BID WC   levETIRAcetam  1,000 mg Oral Daily   levETIRAcetam  250 mg Oral Once per day on Mon Wed Fri   losartan  100 mg Oral QHS   metoprolol succinate  100 mg Oral Q breakfast   pantoprazole  40 mg Oral Daily   pentafluoroprop-tetrafluoroeth       polyethylene glycol  17 g Oral Daily   torsemide  100 mg Oral Q breakfast   sodium chloride, acetaminophen **OR** acetaminophen, diphenoxylate-atropine, glucose, ipratropium-albuterol, lidocaine-prilocaine, magnesium hydroxide, metoCLOPramide (REGLAN) injection, oxyCODONE, pentafluoroprop-tetrafluoroeth, traZODone  Assessment/ Plan:  Cynthia Dean is a 71 y.o.  female ast medical history including COPD, diabetes, carotid stenosis, hypertension, and end-stage renal disease on hemodialysis.  Patient presents to the emergency department with complaints of weakness, fatigue and fever.  Patient has been admitted for ESRD on hemodialysis ([place]) [N18.6, Z99.2] Acute pulmonary embolism without acute cor pulmonale ([place]) [I26.99] Sepsis with acute hypoxic respiratory failure and septic shock, due to unspecified organism ([place]) [A41.9, R65.21, J96.01]  [facility]/MWF/left aVF  End-stage renal disease on hemodialysis.  Patient seen and evaluated during HD.  Tolerating well.    2. Anemia of chronic kidney disease Lab Results  Component Value Date   HGB 7.7 (L) 03/28/2022    Patient receives Martha outpatient.  Hemoglobin still low.  Received blood transfusion previously.  Continue epogen 10000 units IV with HD.   3. Secondary Hyperparathyroidism:  with outpatient labs: PTH 613, phosphorus 4.8, calcium 9.0 on 03/07/2022.   Lab Results  Component Value Date   PTH 131 (H) 03/14/2018   CALCIUM 9.4 03/25/2022   PHOS 3.0 10/25/2019  Bone minerals remain within acceptable range. Continue calcium acetate with meals.  4. Diabetes mellitus type II with chronic kidney disease: insulin dependent. Home regimen includes Novolin. Most recent hemoglobin A1c is 7.5 on 01/29/20.  Glucose management per hospitalist.   5.  Hypertension with chronic kidney disease.  Home regimen includes losartan, metoprolol, and torsemide.  Patient currently receiving these medications.  Blood pressure 108/53   LOS: 8 [place] Dani 9/4/202312:22 PM

## 2022-03-28 NOTE — Progress Notes (Signed)
       CROSS COVER NOTE  NAME: Cynthia Dean MRN: [id number] DOB : 1950/12/20    Date of Service   03/28/2022   HPI/Events of Note   RAPID RESPONSE called for hypotension--> BP 86/32  On arrival to bedside Cynthia Dean is mentating well, warm, well perfused, with +2 radial and DP pulses.Cynthia Dean has ESRD and requires iHD. She was dialyzed today with UF of 1568m.  2215: BP 62/36 MAP 46 patient still asymptomatic  Interventions   Plan: 12.5g Albumin  2556mof NS 5 mg PO Midodrine PM Losartan and AM metoprolol held    0026: Transfer to Stepdown for possible initiation of vasopressors 0115: BP 103/36 on arrival to ICU     This document was prepared using Dragon voice recognition software and may include unintentional dictation errors.  KaNeomia GlassNP, MHA, FNP-BC Nurse Practitioner [facility] ([phone]

## 2022-03-28 NOTE — Progress Notes (Signed)
  Chaplain On-Call responded to Rapid Response notification at 2019 hours.  Learned from Contractor that the patient blood pressure changed suddenly, and that prompted the Rapid Response call.  The patient is being given care by the medical team.  Chaplain is available for additional support if requested.  Chaplain Pollyann Samples M.Div., [facility]

## 2022-03-28 NOTE — Progress Notes (Signed)
Pt did 3.5 hrs of HD Tolerated well, no issues  UF = 1500  VS 115/57 Temp 98.3 Hr 62 O2sat 100 Rr 17  No issue with access. Report given to floor R.N.

## 2022-03-29 DIAGNOSIS — R7881 Bacteremia: Secondary | ICD-10-CM | POA: Diagnosis not present

## 2022-03-29 DIAGNOSIS — B957 Other staphylococcus as the cause of diseases classified elsewhere: Secondary | ICD-10-CM | POA: Diagnosis not present

## 2022-03-29 DIAGNOSIS — D638 Anemia in other chronic diseases classified elsewhere: Secondary | ICD-10-CM | POA: Diagnosis not present

## 2022-03-29 DIAGNOSIS — A419 Sepsis, unspecified organism: Secondary | ICD-10-CM | POA: Diagnosis not present

## 2022-03-29 DIAGNOSIS — I2699 Other pulmonary embolism without acute cor pulmonale: Secondary | ICD-10-CM | POA: Diagnosis not present

## 2022-03-29 LAB — CBC
HCT: 29.7 % — ABNORMAL LOW (ref 36.0–46.0)
Hemoglobin: 9 g/dL — ABNORMAL LOW (ref 12.0–15.0)
MCH: 27.2 pg (ref 26.0–34.0)
MCHC: 30.3 g/dL (ref 30.0–36.0)
MCV: 89.7 fL (ref 80.0–100.0)
Platelets: 255 10*3/uL (ref 150–400)
RBC: 3.31 MIL/uL — ABNORMAL LOW (ref 3.87–5.11)
RDW: 17.4 % — ABNORMAL HIGH (ref 11.5–15.5)
WBC: 12 10*3/uL — ABNORMAL HIGH (ref 4.0–10.5)
nRBC: 0 % (ref 0.0–0.2)

## 2022-03-29 LAB — GLUCOSE, CAPILLARY
Glucose-Capillary: 134 mg/dL — ABNORMAL HIGH (ref 70–99)
Glucose-Capillary: 111 mg/dL — ABNORMAL HIGH (ref 70–99)
Glucose-Capillary: 142 mg/dL — ABNORMAL HIGH (ref 70–99)
Glucose-Capillary: 151 mg/dL — ABNORMAL HIGH (ref 70–99)
Glucose-Capillary: 144 mg/dL — ABNORMAL HIGH (ref 70–99)

## 2022-03-29 MED ORDER — METOPROLOL SUCCINATE ER 50 MG PO TB24: 100.0000 mg | ORAL_TABLET | Freq: Every day | ORAL | Status: DC

## 2022-03-29 MED ORDER — MIDODRINE HCL 5 MG PO TABS
5.0000 mg | ORAL_TABLET | Freq: Once | ORAL | Status: AC
Start: 2022-03-29 — End: 2022-03-29
  Administered 2022-03-29: 5 mg via ORAL
  Filled 2022-03-29: qty 1

## 2022-03-29 MED ORDER — TORSEMIDE 20 MG PO TABS
40.0000 mg | ORAL_TABLET | ORAL | Status: DC
  Administered 2022-03-31: 40 mg via ORAL
  Filled 2022-03-29: qty 2

## 2022-03-29 MED ORDER — METOPROLOL SUCCINATE ER 50 MG PO TB24
50.0000 mg | ORAL_TABLET | Freq: Every day | ORAL | Status: DC
  Administered 2022-03-30 – 2022-04-01 (×3): 50 mg via ORAL
  Filled 2022-03-29 (×2): qty 1

## 2022-03-29 MED ORDER — CALCIUM ACETATE (PHOS BINDER) 667 MG PO CAPS
667.0000 mg | ORAL_CAPSULE | Freq: Three times a day (TID) | ORAL | Status: DC
  Administered 2022-03-29 – 2022-04-01 (×8): 667 mg via ORAL
  Filled 2022-03-29 (×9): qty 1

## 2022-03-29 MED ORDER — TORSEMIDE 20 MG PO TABS
40.0000 mg | ORAL_TABLET | ORAL | Status: DC
Start: 2022-03-30 — End: 2022-03-29

## 2022-03-29 NOTE — Progress Notes (Signed)
Occupational Therapy Re-Evaluation Patient Details Name: Cynthia Dean MRN: [id number] DOB: 19-Sep-1950 Today's Date: 03/29/2022   History of present illness Cera Maurina Fawaz is a 71 y.o. 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   OT comments  Ms Samara was seen for OT re-evaluation with PT on this date. Upon arrival to room pt reclined in bed, family at bed side, agreeable to tx. Pt found with O2 removed - family reports she doffed during meal. SpO2 88-94% on RA at rest, desat mid 30s with mobility. Pt requires SBA bed mobility and don B shoes seated EOB, assist to tie shoes. MIN A + RW sit<>stand. MIN A x2 + RW for step pivot t/f bed>chair, L knee buckling. Goals remain appropriate, will continue to follow POC. Discharge recommendation remains appropriate.     Recommendations for follow up therapy are one component of a multi-disciplinary discharge planning process, led by the attending physician.  Recommendations may be updated based on patient status, additional functional criteria and insurance authorization.    Follow Up Recommendations  Skilled nursing-short term rehab (<3 hours/day)    Assistance Recommended at Discharge Frequent or constant Supervision/Assistance  Patient can return home with the following  Two people to help with walking and/or transfers;Two people to help with bathing/dressing/bathroom   Equipment Recommendations  Other (comment) (defer)    Recommendations for Other Services      Precautions / Restrictions Precautions Precautions: Fall Precaution Comments: watch L knee  buckling Restrictions Weight Bearing Restrictions: No       Mobility Bed Mobility Overal bed mobility: Needs Assistance Bed Mobility: Supine to Sit     Supine to sit: Min guard          Transfers Overall transfer level: Needs assistance Equipment used: Rolling walker (2 wheels) Transfers: Sit to/from Stand, Bed to chair/wheelchair/BSC Sit to Stand: Min assist     Step pivot transfers: Min assist, +2 physical assistance     General transfer comment: L knee buckling, assist to offload for steps     Balance Overall balance assessment: Needs assistance Sitting-balance support: Feet supported, No upper extremity supported Sitting balance-Leahy Scale: Good     Standing balance support: Bilateral upper extremity supported, Reliant on assistive device for balance Standing balance-Leahy Scale: Poor                             ADL either performed or assessed with clinical judgement   ADL Overall ADL's : Needs assistance/impaired                                       General ADL Comments: MIN A x2 + RW for simulated BSC t/f. SBA don B shoes seated EOB, assist to tie.    Extremity/Trunk Assessment Upper Extremity Assessment Upper Extremity Assessment: Generalized weakness   Lower Extremity Assessment Lower Extremity Assessment: Generalized weakness         Cognition Arousal/Alertness: Awake/alert Behavior During Therapy: Flat affect Overall Cognitive Status: Impaired/Different from baseline Area of Impairment: Following commands, Safety/judgement  Following Commands: Follows one step commands with increased time Safety/Judgement: Decreased awareness of safety, Decreased awareness of deficits                    General Comments Pt found with O2 removed - family reports she doffed during meal. SpO2 88-94% on RA at rest, desat mid 22s with mobility.    Pertinent Vitals/ Pain       Pain  Assessment Pain Assessment: No/denies pain   Frequency  Min 2X/week        Progress Toward Goals  OT Goals(current goals can now be found in the care plan section)  Progress towards OT goals: Progressing toward goals  Acute Rehab OT Goals Patient Stated Goal: to walk OT Goal Formulation: With patient Time For Goal Achievement: 04/12/22 Potential to Achieve Goals: Good ADL Goals Pt Will Perform Grooming: with supervision;standing Pt Will Perform Lower Body Dressing: sit to/from stand;with min guard assist Pt Will Transfer to Toilet: with modified independence;ambulating;bedside commode  Plan Discharge plan remains appropriate;Frequency remains appropriate    Co-evaluation                 AM-PAC OT "6 Clicks" Daily Activity     Outcome Measure   Help from another person eating meals?: None Help from another person taking care of personal grooming?: A Lot Help from another person toileting, which includes using toliet, bedpan, or urinal?: A Lot Help from another person bathing (including washing, rinsing, drying)?: A Lot Help from another person to put on and taking off regular upper body clothing?: A Little Help from another person to put on and taking off regular lower body clothing?: A Little 6 Click Score: 16    End of Session    OT Visit Diagnosis: Other abnormalities of gait and mobility (R26.89);Muscle weakness (generalized) (M62.81)   Activity Tolerance Patient tolerated treatment well   Patient Left in chair;with call bell/phone within reach;with chair alarm set;with family/visitor present   Nurse Communication Mobility status        Time: 1660-6301 OT Time Calculation (min): 29 min  Charges: OT General Charges $OT Visit: 1 Visit OT Evaluation $OT Re-eval: 1 Re-eval OT Treatments $Self Care/Home Management : 8-22 mins  Dessie Coma, M.S. OTR/L  03/29/22, 3:48 PM  ascom [phone]

## 2022-03-29 NOTE — Progress Notes (Addendum)
[facility]  ROUNDING NOTE   Subjective:   Cynthia Dean is a 3 71-year-old female with past medical history including COPD, diabetes, carotid stenosis, hypertension, and end-stage renal disease on hemodialysis.  Patient presents to the emergency department with complaints of weakness, fatigue and fever.  Patient has been admitted for ESRD on hemodialysis ([place]) [N18.6, Z99.2] Acute pulmonary embolism without acute cor pulmonale ([place]) [I26.99] Sepsis with acute hypoxic respiratory failure and septic shock, due to unspecified organism ([place]) [A41.9, R65.21, J96.01]  Patient is known to our practice and receives outpatient dialysis treatments at [facility] on a TTS schedule, supervised by Dr. Candiss Norse.   Rapid response called on patient last night due to hypotension and possible need for pressors Patient transferred to ICU Seen and evaluated at bedside in ICU Friend at bedside Blood pressure 115/47 today Patient sitting up in bed receiving assistance with breakfast Denies dizziness. Remains on 2L [place] No pressors Complained of lower abdominal pain.  States she is able to eat okay though.  Objective:  Vital signs in last 24 hours:  Temp:  [97.9 F (36.6 C)-99.3 F (37.4 C)] 97.9 F (36.6 C) (09/05 0728) Pulse Rate:  [62-84] 69 (09/05 0850) Resp:  [11-30] 23 (09/05 0850) BP: (43-130)/(25-66) 101/47 (09/05 0850) SpO2:  [87 %-100 %] 96 % (09/05 0850)  Weight change:  Filed Weights   03/25/22 0342 03/25/22 1149 03/25/22 1621  Weight: 118.5 kg 116 kg 115 kg    Intake/Output: I/O last 3 completed shifts: In: 368.6 [I.V.:113.6; IV Piggyback:255] Out: 1500 [Other:1500]   Intake/Output this shift:  No intake/output data recorded.  Physical Exam: General: NAD, resting comfortably  Head: Normocephalic, atraumatic. Moist oral mucosal membranes  Eyes: Anicteric  Lungs:  Clear to auscultation, normal effort  Heart: Regular rate and rhythm  Abdomen:   Soft, tender in the lower abdominal area, obese  Extremities: trace peripheral edema.  Neurologic: Alert, oriented to person  Skin: No lesions  Access: Left aVG    Basic Metabolic Panel: Recent Labs  Lab 03/23/22 0647 03/25/22 0523  NA 137 137  K 4.4 4.2  CL 101 101  CO2 26 25  GLUCOSE 101* 119*  BUN 38* 27*  CREATININE 9.31* 8.43*  CALCIUM 9.5 9.4     Liver Function Tests: Recent Labs  Lab 03/25/22 0523  AST 14*  ALT 8  ALKPHOS 73  BILITOT 0.7  PROT 6.1*  ALBUMIN 2.6*    No results for input(s): "LIPASE", "AMYLASE" in the last 168 hours. No results for input(s): "AMMONIA" in the last 168 hours.  CBC: Recent Labs  Lab 03/23/22 0647 03/25/22 0523 03/26/22 0714 03/27/22 0447 03/28/22 1000  WBC 10.4 10.9* 13.1* 11.9*  --   HGB 7.5* 7.2* 8.3* 8.2* 7.7*  HCT 24.3* 23.3* 26.6* 26.4* 25.2*  MCV 87.7 89.6 87.5 88.3  --   PLT 235 255 267 277  --      Cardiac Enzymes: No results for input(s): "CKTOTAL", "CKMB", "CKMBINDEX", "TROPONINI" in the last 168 hours.  BNP: Invalid input(s): "POCBNP"  CBG: Recent Labs  Lab 03/28/22 0755 03/28/22 1612 03/28/22 2058 03/29/22 0104 03/29/22 0756  GLUCAP 129* 140* 126* 134* 111*     Microbiology: Results for orders placed or performed during the hospital encounter of 03/19/22  Resp Panel by RT-PCR (Flu A&B, Covid) Anterior Nasal Swab     Status: None   Collection Time: 03/19/22  9:28 PM   Specimen: Anterior Nasal Swab  Result Value Ref Range Status  SARS Coronavirus 2 by RT PCR NEGATIVE NEGATIVE Final    Comment: (NOTE) SARS-CoV-2 target nucleic acids are NOT DETECTED.  The SARS-CoV-2 RNA is generally detectable in upper respiratory specimens during the acute phase of infection. The lowest concentration of SARS-CoV-2 viral copies this assay can detect is 138 copies/mL. A negative result does not preclude SARS-Cov-2 infection and should not be used as the sole basis for treatment or other patient  management decisions. A negative result may occur with  improper specimen collection/handling, submission of specimen other than nasopharyngeal swab, presence of viral mutation(s) within the areas targeted by this assay, and inadequate number of viral copies(<138 copies/mL). A negative result must be combined with clinical observations, patient history, and epidemiological information. The expected result is Negative.  Fact Sheet for Patients:  EntrepreneurPulse.com.au  Fact Sheet for Healthcare Providers:  IncredibleEmployment.be  This test is no t yet approved or cleared by the [place] FDA and  has been authorized for detection and/or diagnosis of SARS-CoV-2 by FDA under an Emergency Use Authorization (EUA). This EUA will remain  in effect (meaning this test can be used) for the duration of the COVID-19 declaration under Section 564(b)(1) of the Act, 21 U.S.C.section 360bbb-3(b)(1), unless the authorization is terminated  or revoked sooner.       Influenza A by PCR NEGATIVE NEGATIVE Final   Influenza B by PCR NEGATIVE NEGATIVE Final    Comment: (NOTE) The Xpert Xpress SARS-CoV-2/FLU/RSV plus assay is intended as an aid in the diagnosis of influenza from Nasopharyngeal swab specimens and should not be used as a sole basis for treatment. Nasal washings and aspirates are unacceptable for Xpert Xpress SARS-CoV-2/FLU/RSV testing.  Fact Sheet for Patients: EntrepreneurPulse.com.au  Fact Sheet for Healthcare Providers: IncredibleEmployment.be  This test is not yet approved or cleared by the [place] FDA and has been authorized for detection and/or diagnosis of SARS-CoV-2 by FDA under an Emergency Use Authorization (EUA). This EUA will remain in effect (meaning this test can be used) for the duration of the COVID-19 declaration under Section 564(b)(1) of the Act, 21 U.S.C. section 360bbb-3(b)(1),  unless the authorization is terminated or revoked.  Performed at [facility], [address] [place], [place] [address]   Blood Culture (routine x 2)     Status: Abnormal   Collection Time: 03/19/22  9:28 PM   Specimen: BLOOD  Result Value Ref Range Status   Specimen Description   Final    BLOOD RIGHT ANTECUBITAL Performed at [facility], [address], [place], [place] [address]    Special Requests   Final    BOTTLES DRAWN AEROBIC AND ANAEROBIC Blood Culture adequate volume Performed at [facility], [address] [place], [place] [address]    Culture  Setup Time   Final    GRAM POSITIVE COCCI IN BOTH AEROBIC AND ANAEROBIC BOTTLES CRITICAL RESULT CALLED TO, READ BACK BY AND VERIFIED WITH: CAROLYN CHILDS 03/20/22 1457 AMK Performed at [facility], [address] [place], [place] [address]    Culture STAPHYLOCOCCUS CAPITIS (A)  Final   Report Status 03/23/2022 FINAL  Final   Organism ID, Bacteria STAPHYLOCOCCUS CAPITIS  Final      Susceptibility   Staphylococcus capitis - MIC*    CIPROFLOXACIN <=0.5 SENSITIVE Sensitive     ERYTHROMYCIN <=0.25 SENSITIVE Sensitive     GENTAMICIN <=0.5 SENSITIVE Sensitive     OXACILLIN <=0.25 SENSITIVE Sensitive     TETRACYCLINE <=1 SENSITIVE Sensitive  VANCOMYCIN <=0.5 SENSITIVE Sensitive     TRIMETH/SULFA <=10 SENSITIVE Sensitive     CLINDAMYCIN <=0.25 SENSITIVE Sensitive     RIFAMPIN <=0.5 SENSITIVE Sensitive     Inducible Clindamycin NEGATIVE Sensitive     * STAPHYLOCOCCUS CAPITIS  Blood Culture ID Panel (Reflexed)     Status: Abnormal   Collection Time: 03/19/22  9:28 PM  Result Value Ref Range Status   Enterococcus faecalis NOT DETECTED NOT DETECTED Final   Enterococcus Faecium NOT DETECTED NOT DETECTED Final   Listeria monocytogenes NOT DETECTED NOT DETECTED Final   Staphylococcus species DETECTED (A) NOT DETECTED Final    Comment: CRITICAL RESULT CALLED TO, READ BACK BY AND VERIFIED  WITH: CAROLYN CHILDS 03/20/22 1457 AMK    Staphylococcus aureus (BCID) NOT DETECTED NOT DETECTED Final   Staphylococcus epidermidis NOT DETECTED NOT DETECTED Final   Staphylococcus lugdunensis NOT DETECTED NOT DETECTED Final   Streptococcus species NOT DETECTED NOT DETECTED Final   Streptococcus agalactiae NOT DETECTED NOT DETECTED Final   Streptococcus pneumoniae NOT DETECTED NOT DETECTED Final   Streptococcus pyogenes NOT DETECTED NOT DETECTED Final   A.calcoaceticus-baumannii NOT DETECTED NOT DETECTED Final   Bacteroides fragilis NOT DETECTED NOT DETECTED Final   Enterobacterales NOT DETECTED NOT DETECTED Final   Enterobacter cloacae complex NOT DETECTED NOT DETECTED Final   Escherichia coli NOT DETECTED NOT DETECTED Final   Klebsiella aerogenes NOT DETECTED NOT DETECTED Final   Klebsiella oxytoca NOT DETECTED NOT DETECTED Final   Klebsiella pneumoniae NOT DETECTED NOT DETECTED Final   Proteus species NOT DETECTED NOT DETECTED Final   Salmonella species NOT DETECTED NOT DETECTED Final   Serratia marcescens NOT DETECTED NOT DETECTED Final   Haemophilus influenzae NOT DETECTED NOT DETECTED Final   Neisseria meningitidis NOT DETECTED NOT DETECTED Final   Pseudomonas aeruginosa NOT DETECTED NOT DETECTED Final   Stenotrophomonas maltophilia NOT DETECTED NOT DETECTED Final   Candida albicans NOT DETECTED NOT DETECTED Final   Candida auris NOT DETECTED NOT DETECTED Final   Candida glabrata NOT DETECTED NOT DETECTED Final   Candida krusei NOT DETECTED NOT DETECTED Final   Candida parapsilosis NOT DETECTED NOT DETECTED Final   Candida tropicalis NOT DETECTED NOT DETECTED Final   Cryptococcus neoformans/gattii NOT DETECTED NOT DETECTED Final    Comment: Performed at [facility], [address] [place], [place] [address]  Blood Culture (routine x 2)     Status: Abnormal   Collection Time: 03/19/22 10:23 PM   Specimen: BLOOD  Result Value Ref Range Status   Specimen Description    Final    BLOOD BLOOD RIGHT FOREARM Performed at [facility], [address], [place], [place] [address]    Special Requests   Final    BOTTLES DRAWN AEROBIC AND ANAEROBIC Blood Culture adequate volume Performed at [facility], [address] [place], [place] [address]    Culture  Setup Time   Final    GRAM POSITIVE COCCI IN BOTH AEROBIC AND ANAEROBIC BOTTLES CRITICAL RESULT CALLED TO, READ BACK BY AND VERIFIED WITH: ELVERA MADUEME 03/20/22 1545 AMK Performed at [facility], [address] [place], [place] [address]    Culture (A)  Final    STAPHYLOCOCCUS CAPITIS SUSCEPTIBILITIES PERFORMED ON PREVIOUS CULTURE WITHIN THE LAST 5 DAYS. Performed at [facility], [place] [address] [place], [place] [address]    Report Status 03/22/2022 FINAL  Final  Urine Culture     Status: Abnormal   Collection Time: 03/20/22  1:25  AM   Specimen: In/Out Cath Urine  Result Value Ref Range Status   Specimen Description   Final    IN/OUT CATH URINE Performed at [facility], [address] [place], [place] [address]    Special Requests   Final    NONE Performed at [facility], [address] [place], [place] [address]    Culture 4,000 COLONIES/mL STAPHYLOCOCCUS EPIDERMIDIS (A)  Final   Report Status 03/22/2022 FINAL  Final   Organism ID, Bacteria STAPHYLOCOCCUS EPIDERMIDIS (A)  Final      Susceptibility   Staphylococcus epidermidis - MIC*    CIPROFLOXACIN <=0.5 SENSITIVE Sensitive     GENTAMICIN <=0.5 SENSITIVE Sensitive     NITROFURANTOIN <=16 SENSITIVE Sensitive     OXACILLIN <=0.25 SENSITIVE Sensitive     TETRACYCLINE <=1 SENSITIVE Sensitive     VANCOMYCIN 2 SENSITIVE Sensitive     TRIMETH/SULFA 80 RESISTANT Resistant     CLINDAMYCIN >=8 RESISTANT Resistant     RIFAMPIN <=0.5 SENSITIVE Sensitive     Inducible Clindamycin NEGATIVE Sensitive     * 4,000 COLONIES/mL STAPHYLOCOCCUS EPIDERMIDIS  Culture, blood (Routine X 2) w  Reflex to ID Panel     Status: None   Collection Time: 03/21/22 11:00 AM   Specimen: BLOOD  Result Value Ref Range Status   Specimen Description BLOOD BLOOD RIGHT FOREARM  Final   Special Requests   Final    BOTTLES DRAWN AEROBIC AND ANAEROBIC Blood Culture adequate volume   Culture   Final    NO GROWTH 5 DAYS Performed at [facility], [address] [place], [place] [address]    Report Status 03/26/2022 FINAL  Final  Culture, blood (Routine X 2) w Reflex to ID Panel     Status: None   Collection Time: 03/22/22 11:53 AM   Specimen: BLOOD RIGHT HAND  Result Value Ref Range Status   Specimen Description BLOOD RIGHT HAND  Final   Special Requests   Final    BOTTLES DRAWN AEROBIC AND ANAEROBIC Blood Culture adequate volume   Culture   Final    NO GROWTH 5 DAYS Performed at [facility], [address], [place], [place] [address]    Report Status 03/27/2022 FINAL  Final  Aerobic/Anaerobic Culture w Gram Stain (surgical/deep wound)     Status: None (Preliminary result)   Collection Time: 03/25/22 10:30 AM   Specimen: Wound; Blood  Result Value Ref Range Status   Specimen Description WOUND LEFT BREAST  Final   Special Requests BLOOD HEMATOMA  Final   Gram Stain   Final    RARE WBC PRESENT, PREDOMINANTLY MONONUCLEAR NO ORGANISMS SEEN    Culture   Final    NO GROWTH 3 DAYS NO ANAEROBES ISOLATED; CULTURE IN PROGRESS FOR 5 DAYS Performed at [facility], [place] [address], [place],  [address]    Report Status PENDING  Incomplete    Coagulation Studies: No results for input(s): "LABPROT", "INR" in the last 72 hours.   Urinalysis: No results for input(s): "COLORURINE", "LABSPEC", "PHURINE", "GLUCOSEU", "HGBUR", "BILIRUBINUR", "KETONESUR", "PROTEINUR", "UROBILINOGEN", "NITRITE", "LEUKOCYTESUR" in the last 72 hours.  Invalid input(s): "APPERANCEUR"     Imaging: No results found.   Medications:    sodium chloride 10 mL/hr at 03/29/22 0100     ceFAZolin (ANCEF) IV Stopped (03/28/22 1833)    sodium chloride   Intravenous Once   apixaban  10 mg Oral BID   Followed by   Derrill Memo ON 04/03/2022]  apixaban  5 mg Oral BID   atorvastatin  80 mg Oral QPM   calcium acetate  2,001 mg Oral TID WC   Chlorhexidine Gluconate Cloth  6 each Topical Q0600   vitamin D3  1,000 Units Oral Daily   cinacalcet  30 mg Oral Q breakfast   docusate sodium  200 mg Oral BID   epoetin (EPOGEN/PROCRIT) injection  10,000 Units Intravenous Q M,W,F-HD   fluticasone  1 spray Each Nare Daily   gabapentin  100 mg Oral BID   insulin aspart  0-6 Units Subcutaneous TID WC   insulin aspart protamine- aspart  10 Units Subcutaneous BID WC   levETIRAcetam  1,000 mg Oral Daily   levETIRAcetam  250 mg Oral Once per day on Mon Wed Fri   [START ON 03/30/2022] metoprolol succinate  50 mg Oral Q breakfast   midodrine  5 mg Oral Once   pantoprazole  40 mg Oral Daily   polyethylene glycol  17 g Oral Daily   torsemide  100 mg Oral Q breakfast   sodium chloride, acetaminophen **OR** acetaminophen, diphenoxylate-atropine, glucose, ipratropium-albuterol, lidocaine-prilocaine, magnesium hydroxide, metoCLOPramide (REGLAN) injection, oxyCODONE, traZODone  Assessment/ Plan:  [name] is a 71 y.o.  female ast medical history including COPD, diabetes, carotid stenosis, hypertension, and end-stage renal disease on hemodialysis.  Patient presents to the emergency department with complaints of weakness, fatigue and fever.  Patient has been admitted for ESRD on hemodialysis ([place]) [N18.6, Z99.2] Acute pulmonary embolism without acute cor pulmonale ([place]) [I26.99] Sepsis with acute hypoxic respiratory failure and septic shock, due to unspecified organism ([place]) [A41.9, R65.21, J96.01]  [facility]/MWF/left aVF  End-stage renal disease on hemodialysis.  Dialysis received yesterday, UF 1.5L achieved. Next treatment scheduled for Wednesday.    2. Anemia of chronic  kidney disease Lab Results  Component Value Date   HGB 7.7 (L) 03/28/2022    Patient receives [facility] outpatient.  Received blood transfusion during this admission.  Continue epogen 10000 units IV with HD.   3. Secondary Hyperparathyroidism: with outpatient labs: PTH 613, phosphorus 4.8, calcium 9.0 on 03/07/2022.   Lab Results  Component Value Date   PTH 131 (H) 03/14/2018   CALCIUM 9.4 03/25/2022   PHOS 3.0 10/25/2019  Calcium and phosphorus remain within acceptable range. Continue calcium acetate with meals.  Reduce the dose to 1 tablet with meals as patient is not eating as much.  4. Diabetes mellitus type II with chronic kidney disease: insulin dependent. Home regimen includes Novolin. Most recent hemoglobin A1c is 7.5 on 01/29/20.  Glucose stable at this time.  Primary team to manage sliding scale insulin.  5.  Hypertension with chronic kidney disease.  Home regimen includes losartan, metoprolol, and torsemide.     Patient hypotensive yesterday, rapid response called and patient sent to ICU for possible pressor use.  May use torsemide on nondialysis days.   LOS: 9 Shantelle Breeze 9/5/202310:02 AM   Patient was examined and evaluated with Colon Flattery, NP.  Plan of care was formulated and discussed with patient as well as NP.  I agree with the note as documented with edits.

## 2022-03-29 NOTE — Progress Notes (Signed)
PROGRESS NOTE   HPI was taken from Dr. Sidney Ace: Cynthia Dean is a 71 y.o. African-American female with medical history significant for COPD, carotid stenosis, type 2 diabetes mellitus, end-stage renal disease on hemodialysis on MWF, hypertension and dyslipidemia, as well as invasive ductal carcinoma of the left breast status post surgical excision at [facility] recently, who presented to the emergency room with acute onset of generalized weakness and fatigue for the last couple of days.  She missed hemodialysis session yesterday.  She was fairly confused in the ER.  She has been having dyspnea with mild cough and occasional wheezing.  She admitted to fever and chills.  No chest pain or palpitations.  No nausea or vomiting or diarrhea or abdominal pain.  No bleeding diathesis.   ED Course: Upon presentation to the emergency room temperature was 102.9 with BP 139/57 heart rate 50 with respiratory to 23  with pulse oximetry of 87% on room air and 98% on 4 L of O2 by nasal cannula.  Labs revealed leukocytosis 16.5 with neutrophilia and hemoglobin of 8.1 with hematocrit 26.1 with platelets of 214.  Influenza antigens and COVID-19 PCR came back negative.  Blood cultures were drawn. EKG as reviewed by me : EKG showed sinus rhythm with rate of 81 with T wave inversion laterally. Imaging: Portable chest x-ray showed increasing vascular congestion without pulmonary edema.  Chest CTA revealed small pulmonary emboli within both lower lobes and the right upper lobe with no evidence for right heart strain.  It showed cardiomegaly and coronary artery disease with bibasal atelectasis and large masslike area in the left breast with surrounding soft tissue gas that corresponds with recent left lumpectomy.   The patient was given IV cefepime and heparin bolus and drip as well as 500 mL IV normal saline.  She will be admitted to a progressive unit bed for further evaluation and management.  As per Dr. Jimmye Norman  8/27-8/29/23: Pt presented w/ shortness of breath and was found to have acute pulmonary embolism w/o right heart strain. Pt was initially put on IV heparin drip and then changed to po eliquis. Echo is pending. Also, pt was admitted for mild breast cellulitis and has been on IV rocephin. Urine cx was growing staph epidermis and blood cxs growing stap captis for which pt has been on IV doxycycline. The blood cxs likely represent a containment. Repeat blood cxs NGTD. PT recs SNF.   This week:  8/31 LT breast US done.  9/1 s/p IR aspiration of Lt breast fluid collection.  Wbc was going up , but started to improve. Surgery did not do any I/D. Transitioned from heparin drip to Eliquis.  Had dialysis yesterday and in the evening blood pressure dropped to 62/36.  She was given albumin in normal sinus and 1 dose of 5 mg midodrine.  Now better. Made changes to her bp meds. Nephrology following. She was transfused x2 prbc due to low h/h. No reports of bleed. PT rec. SNF.        Cynthia Dean  FIE:[id number] DOB: [birth year] DOA: 03/19/2022 PCP: Lowella Bandy, MD   Assessment & Plan:   Principal Problem:   Acute pulmonary embolism without acute cor pulmonale (HCC) Active Problems:   Sepsis due to cellulitis ([place])   ESRD on hemodialysis ([place])   Essential hypertension   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[place])   Dyslipidemia   Diabetic neuropathy (HCC)   Urinary tract infection   Anemia of chronic  disease   Left breast abscess   Seroma of breast   Bacteremia due to coagulase-negative Staphylococcus  Assessment and Plan: Acute pulmonary embolism: without acute cor pulmonale. D/c IV heparin and start eliquis.  8/30 aspirin Plavix on hold  Echo with EF 50 to 55%.  Grade 2 diastolic dysfunction.  RV normal function and size  Holding home dose of aspirin, plavix (was not on plavix per pharm but will ck again) RV systolic function and size normal.  EF 50 to  55% 9/5 continue on Eliquis. Heparin was d/c;d yesterday       ACD: likely secondary to ESRD.  W/ possible component of acute blood loss anemia. S/p 1 unit of pRBCs transfused so far.  Status post 1 unit PRBC during dialysis on 9/1  Bp low last night, will ck h/h , to see if need transfusion again.       Sepsis:  Likely secondary to left breast cellulitis. Resolved with IV antibiotics      Mild cellulitis of left breast:  On Rocephin...changed to cefazolin by Id 8/31 Lt breast US with seroma v.s. abscess. 9/1 s/p IR aspiration of hematoma. Cx sent 9/2 gsx following. Wbc up . Gsx rec. Continue heparin gtt incase needs I/d.  9/3 Okay to resume oral a/c per surgery.  9/5 clinically improving. No need for further I/D  Staph capitis bacteremia:  Likely from breast  ID following 9/5 treating with iv cefazolin Repeat cultures today negative Follow-up ID for any further recommendation   Possible UTI: urine cx staph epidermidis.  Was treated with iv doxy  Now on iv cefazolin for above    ESRD: on HD MWF.  Nephrology following On torsemide we will discuss with nephrology if need to cut back since she was hypotensive    Constipation: continue on colace, miralax   Breast cancer: recent dx. S/p surgical excision at [facility] & chemo. High risk of blood clots w/ hx of cancer    Diabetic neuropathy: continue on home dose of gabapentin    HLD: continue on statin    DM2: likely poorly controlled.  HbA1c 5.8 Continue current management.     HTN:  Bp low . Decrease toprol xl from 100 mg to '50mg'$  with parameters. No ARB D/w nephrology about torsemide.         DVT prophylaxis: elqiuis Code Status:  full  Family Communication: None at bedside Disposition Plan: SNF  Level of care: Stepdown  Status is: Inpatient Remains inpatient appropriate because: severity of illness, getting transfusion    Consultants:  Nephro   Procedures:  Antimicrobials: rocephin,  doxycycline ..>cefazolin   Subjective: No sob, cp. Feeling better  Objective: Vitals:   03/29/22 1121 03/29/22 1200 03/29/22 1300 03/29/22 1400  BP: (!) 116/44 (!) 126/46 (!) 129/50 (!) 147/61  Pulse: 70 69 70 68  Resp: '20 19 14 19  '$ Temp:  98.3 F (36.8 C)    TempSrc:  Oral    SpO2: 90% 97% 99% 99%  Weight:      Height:        Intake/Output Summary (Last 24 hours) at 03/29/2022 1501 Last data filed at 03/29/2022 1200 Gross per 24 hour  Intake 848.61 ml  Output 0 ml  Net 848.61 ml   Filed Weights   03/25/22 0342 03/25/22 1149 03/25/22 1621  Weight: 118.5 kg 116 kg 115 kg    Examination: Calm, NAD Cta no w/r Reg s1/s2 no gallop Soft benign +bs No edema Awake and alert Mood and  affect appropriate in current setting    Data Reviewed: I have personally reviewed following labs and imaging studies  CBC: Recent Labs  Lab 03/23/22 0647 03/25/22 0523 03/26/22 0714 03/27/22 0447 03/28/22 1000  WBC 10.4 10.9* 13.1* 11.9*  --   HGB 7.5* 7.2* 8.3* 8.2* 7.7*  HCT 24.3* 23.3* 26.6* 26.4* 25.2*  MCV 87.7 89.6 87.5 88.3  --   PLT 235 255 267 277  --    Basic Metabolic Panel: Recent Labs  Lab 03/23/22 0647 03/25/22 0523  NA 137 137  K 4.4 4.2  CL 101 101  CO2 26 25  GLUCOSE 101* 119*  BUN 38* 27*  CREATININE 9.31* 8.43*  CALCIUM 9.5 9.4   GFR: Estimated Creatinine Clearance: 8.3 mL/min (A) (by C-G formula based on SCr of 8.43 mg/dL (H)). Liver Function Tests: Recent Labs  Lab 03/25/22 0523  AST 14*  ALT 8  ALKPHOS 73  BILITOT 0.7  PROT 6.1*  ALBUMIN 2.6*   No results for input(s): "LIPASE", "AMYLASE" in the last 168 hours. No results for input(s): "AMMONIA" in the last 168 hours. Coagulation Profile: Recent Labs  Lab 03/24/22 1656  INR 1.7*   Cardiac Enzymes: No results for input(s): "CKTOTAL", "CKMB", "CKMBINDEX", "TROPONINI" in the last 168 hours. BNP (last 3 results) No results for input(s): "PROBNP" in the last 8760 hours. HbA1C: No  results for input(s): "HGBA1C" in the last 72 hours.  CBG: Recent Labs  Lab 03/28/22 1612 03/28/22 2058 03/29/22 0104 03/29/22 0756 03/29/22 1139  GLUCAP 140* 126* 134* 111* 142*   Lipid Profile: No results for input(s): "CHOL", "HDL", "LDLCALC", "TRIG", "CHOLHDL", "LDLDIRECT" in the last 72 hours. Thyroid Function Tests: No results for input(s): "TSH", "T4TOTAL", "FREET4", "T3FREE", "THYROIDAB" in the last 72 hours. Anemia Panel: No results for input(s): "VITAMINB12", "FOLATE", "FERRITIN", "TIBC", "IRON", "RETICCTPCT" in the last 72 hours. Sepsis Labs: No results for input(s): "PROCALCITON", "LATICACIDVEN" in the last 168 hours.   Recent Results (from the past 240 hour(s))  Resp Panel by RT-PCR (Flu A&B, Covid) Anterior Nasal Swab     Status: None   Collection Time: 03/19/22  9:28 PM   Specimen: Anterior Nasal Swab  Result Value Ref Range Status   SARS Coronavirus 2 by RT PCR NEGATIVE NEGATIVE Final    Comment: (NOTE) SARS-CoV-2 target nucleic acids are NOT DETECTED.  The SARS-CoV-2 RNA is generally detectable in upper respiratory specimens during the acute phase of infection. The lowest concentration of SARS-CoV-2 viral copies this assay can detect is 138 copies/mL. A negative result does not preclude SARS-Cov-2 infection and should not be used as the sole basis for treatment or other patient management decisions. A negative result may occur with  improper specimen collection/handling, submission of specimen other than nasopharyngeal swab, presence of viral mutation(s) within the areas targeted by this assay, and inadequate number of viral copies(<138 copies/mL). A negative result must be combined with clinical observations, patient history, and epidemiological information. The expected result is Negative.  Fact Sheet for Patients:  EntrepreneurPulse.com.au  Fact Sheet for Healthcare Providers:  IncredibleEmployment.be  This test  is no t yet approved or cleared by the [place] FDA and  has been authorized for detection and/or diagnosis of SARS-CoV-2 by FDA under an Emergency Use Authorization (EUA). This EUA will remain  in effect (meaning this test can be used) for the duration of the COVID-19 declaration under Section 564(b)(1) of the Act, 21 U.S.C.section 360bbb-3(b)(1), unless the authorization is terminated  or revoked sooner.  Influenza A by PCR NEGATIVE NEGATIVE Final   Influenza B by PCR NEGATIVE NEGATIVE Final    Comment: (NOTE) The Xpert Xpress SARS-CoV-2/FLU/RSV plus assay is intended as an aid in the diagnosis of influenza from Nasopharyngeal swab specimens and should not be used as a sole basis for treatment. Nasal washings and aspirates are unacceptable for Xpert Xpress SARS-CoV-2/FLU/RSV testing.  Fact Sheet for Patients: EntrepreneurPulse.com.au  Fact Sheet for Healthcare Providers: IncredibleEmployment.be  This test is not yet approved or cleared by the [place] FDA and has been authorized for detection and/or diagnosis of SARS-CoV-2 by FDA under an Emergency Use Authorization (EUA). This EUA will remain in effect (meaning this test can be used) for the duration of the COVID-19 declaration under Section 564(b)(1) of the Act, 21 U.S.C. section 360bbb-3(b)(1), unless the authorization is terminated or revoked.  Performed at [facility], [address] [place], [place] [address]   Blood Culture (routine x 2)     Status: Abnormal   Collection Time: 03/19/22  9:28 PM   Specimen: BLOOD  Result Value Ref Range Status   Specimen Description   Final    BLOOD RIGHT ANTECUBITAL Performed at [facility], [address] [place], [place] [address]    Special Requests   Final    BOTTLES DRAWN AEROBIC AND ANAEROBIC Blood Culture adequate volume Performed at [facility], [place], [place]  [address]    Culture  Setup Time   Final    GRAM POSITIVE COCCI IN BOTH AEROBIC AND ANAEROBIC BOTTLES CRITICAL RESULT CALLED TO, READ BACK BY AND VERIFIED WITH: CAROLYN CHILDS 03/20/22 1457 AMK Performed at [facility], [address] [place], [place] [address]    Culture STAPHYLOCOCCUS CAPITIS (A)  Final   Report Status 03/23/2022 FINAL  Final   Organism ID, Bacteria STAPHYLOCOCCUS CAPITIS  Final      Susceptibility   Staphylococcus capitis - MIC*    CIPROFLOXACIN <=0.5 SENSITIVE Sensitive     ERYTHROMYCIN <=0.25 SENSITIVE Sensitive     GENTAMICIN <=0.5 SENSITIVE Sensitive     OXACILLIN <=0.25 SENSITIVE Sensitive     TETRACYCLINE <=1 SENSITIVE Sensitive     VANCOMYCIN <=0.5 SENSITIVE Sensitive     TRIMETH/SULFA <=10 SENSITIVE Sensitive     CLINDAMYCIN <=0.25 SENSITIVE Sensitive     RIFAMPIN <=0.5 SENSITIVE Sensitive     Inducible Clindamycin NEGATIVE Sensitive     * STAPHYLOCOCCUS CAPITIS  Blood Culture ID Panel (Reflexed)     Status: Abnormal   Collection Time: 03/19/22  9:28 PM  Result Value Ref Range Status   Enterococcus faecalis NOT DETECTED NOT DETECTED Final   Enterococcus Faecium NOT DETECTED NOT DETECTED Final   Listeria monocytogenes NOT DETECTED NOT DETECTED Final   Staphylococcus species DETECTED (A) NOT DETECTED Final    Comment: CRITICAL RESULT CALLED TO, READ BACK BY AND VERIFIED WITH: CAROLYN CHILDS 03/20/22 1457 AMK    Staphylococcus aureus (BCID) NOT DETECTED NOT DETECTED Final   Staphylococcus epidermidis NOT DETECTED NOT DETECTED Final   Staphylococcus lugdunensis NOT DETECTED NOT DETECTED Final   Streptococcus species NOT DETECTED NOT DETECTED Final   Streptococcus agalactiae NOT DETECTED NOT DETECTED Final   Streptococcus pneumoniae NOT DETECTED NOT DETECTED Final   Streptococcus pyogenes NOT DETECTED NOT DETECTED Final   A.calcoaceticus-baumannii NOT DETECTED NOT DETECTED Final   Bacteroides fragilis NOT DETECTED NOT DETECTED Final    Enterobacterales NOT DETECTED NOT DETECTED Final   Enterobacter cloacae complex NOT DETECTED NOT DETECTED  Final   Escherichia coli NOT DETECTED NOT DETECTED Final   Klebsiella aerogenes NOT DETECTED NOT DETECTED Final   Klebsiella oxytoca NOT DETECTED NOT DETECTED Final   Klebsiella pneumoniae NOT DETECTED NOT DETECTED Final   Proteus species NOT DETECTED NOT DETECTED Final   Salmonella species NOT DETECTED NOT DETECTED Final   Serratia marcescens NOT DETECTED NOT DETECTED Final   Haemophilus influenzae NOT DETECTED NOT DETECTED Final   Neisseria meningitidis NOT DETECTED NOT DETECTED Final   Pseudomonas aeruginosa NOT DETECTED NOT DETECTED Final   Stenotrophomonas maltophilia NOT DETECTED NOT DETECTED Final   Candida albicans NOT DETECTED NOT DETECTED Final   Candida auris NOT DETECTED NOT DETECTED Final   Candida glabrata NOT DETECTED NOT DETECTED Final   Candida krusei NOT DETECTED NOT DETECTED Final   Candida parapsilosis NOT DETECTED NOT DETECTED Final   Candida tropicalis NOT DETECTED NOT DETECTED Final   Cryptococcus neoformans/gattii NOT DETECTED NOT DETECTED Final    Comment: Performed at [facility], [address] [place], [place] [address]  Blood Culture (routine x 2)     Status: Abnormal   Collection Time: 03/19/22 10:23 PM   Specimen: BLOOD  Result Value Ref Range Status   Specimen Description   Final    BLOOD BLOOD RIGHT FOREARM Performed at [facility], [address], [place], [place] [address]    Special Requests   Final    BOTTLES DRAWN AEROBIC AND ANAEROBIC Blood Culture adequate volume Performed at [facility], [address] [place], [place] [address]    Culture  Setup Time   Final    GRAM POSITIVE COCCI IN BOTH AEROBIC AND ANAEROBIC BOTTLES CRITICAL RESULT CALLED TO, READ BACK BY AND VERIFIED WITH: ELVERA MADUEME 03/20/22 1545 AMK Performed at [facility], [address] [place], [place] [address]    Culture  (A)  Final    STAPHYLOCOCCUS CAPITIS SUSCEPTIBILITIES PERFORMED ON PREVIOUS CULTURE WITHIN THE LAST 5 DAYS. Performed at [facility], [place] [address] [place], [place] [address]    Report Status 03/22/2022 FINAL  Final  Urine Culture     Status: Abnormal   Collection Time: 03/20/22  1:25 AM   Specimen: In/Out Cath Urine  Result Value Ref Range Status   Specimen Description   Final    IN/OUT CATH URINE Performed at [facility], [address] [place], [place] [address]    Special Requests   Final    NONE Performed at [facility], [place], [place] [address]    Culture 4,000 COLONIES/mL STAPHYLOCOCCUS EPIDERMIDIS (A)  Final   Report Status 03/22/2022 FINAL  Final   Organism ID, Bacteria STAPHYLOCOCCUS EPIDERMIDIS (A)  Final      Susceptibility   Staphylococcus epidermidis - MIC*    CIPROFLOXACIN <=0.5 SENSITIVE Sensitive     GENTAMICIN <=0.5 SENSITIVE Sensitive     NITROFURANTOIN <=16 SENSITIVE Sensitive     OXACILLIN <=0.25 SENSITIVE Sensitive     TETRACYCLINE <=1 SENSITIVE Sensitive     VANCOMYCIN 2 SENSITIVE Sensitive     TRIMETH/SULFA 80 RESISTANT Resistant     CLINDAMYCIN >=8 RESISTANT Resistant     RIFAMPIN <=0.5 SENSITIVE Sensitive     Inducible Clindamycin NEGATIVE Sensitive     * 4,000 COLONIES/mL STAPHYLOCOCCUS EPIDERMIDIS  Culture, blood (Routine X 2) w Reflex to ID Panel     Status: None   Collection Time: 03/21/22 11:00 AM   Specimen: BLOOD  Result Value Ref Range  Status   Specimen Description BLOOD BLOOD RIGHT FOREARM  Final   Special Requests   Final    BOTTLES DRAWN AEROBIC AND ANAEROBIC Blood Culture adequate volume   Culture   Final    NO GROWTH 5 DAYS Performed at [facility], [address] [place], [place] [address]    Report Status 03/26/2022 FINAL  Final  Culture, blood (Routine X 2) w Reflex to ID Panel     Status: None   Collection Time: 03/22/22 11:53 AM   Specimen: BLOOD RIGHT HAND   Result Value Ref Range Status   Specimen Description BLOOD RIGHT HAND  Final   Special Requests   Final    BOTTLES DRAWN AEROBIC AND ANAEROBIC Blood Culture adequate volume   Culture   Final    NO GROWTH 5 DAYS Performed at [facility], [address], [place], [place] [address]    Report Status 03/27/2022 FINAL  Final  Aerobic/Anaerobic Culture w Gram Stain (surgical/deep wound)     Status: None (Preliminary result)   Collection Time: 03/25/22 10:30 AM   Specimen: Wound; Blood  Result Value Ref Range Status   Specimen Description WOUND LEFT BREAST  Final   Special Requests BLOOD HEMATOMA  Final   Gram Stain   Final    RARE WBC PRESENT, PREDOMINANTLY MONONUCLEAR NO ORGANISMS SEEN    Culture   Final    NO GROWTH 4 DAYS NO ANAEROBES ISOLATED; CULTURE IN PROGRESS FOR 5 DAYS Performed at [facility], [address] [place], [place] [address]    Report Status PENDING  Incomplete         Radiology Studies: No results found.      Scheduled Meds:  sodium chloride   Intravenous Once   apixaban  10 mg Oral BID   Followed by   Derrill Memo ON 04/03/2022] apixaban  5 mg Oral BID   atorvastatin  80 mg Oral QPM   calcium acetate  2,001 mg Oral TID WC   Chlorhexidine Gluconate Cloth  6 each Topical Q0600   vitamin D3  1,000 Units Oral Daily   cinacalcet  30 mg Oral Q breakfast   docusate sodium  200 mg Oral BID   epoetin (EPOGEN/PROCRIT) injection  10,000 Units Intravenous Q M,W,F-HD   fluticasone  1 spray Each Nare Daily   gabapentin  100 mg Oral BID   insulin aspart  0-6 Units Subcutaneous TID WC   insulin aspart protamine- aspart  10 Units Subcutaneous BID WC   levETIRAcetam  1,000 mg Oral Daily   levETIRAcetam  250 mg Oral Once per day on Mon Wed Fri   [START ON 03/30/2022] metoprolol succinate  50 mg Oral Q breakfast   pantoprazole  40 mg Oral Daily   polyethylene glycol  17 g Oral Daily   torsemide  100 mg Oral Q breakfast   Continuous Infusions:   sodium chloride 10 mL/hr at 03/29/22 0100    ceFAZolin (ANCEF) IV Stopped (03/28/22 1833)     LOS: 9 days    Time spent: 35 mins     Nolberto Hanlon, MD [facility] Pager [phone]  If 7PM-7AM, please contact night-coverage [email or web address] 03/29/2022, 3:01 PM

## 2022-03-29 NOTE — Evaluation (Signed)
Physical Therapy Re-Evaluation Patient Details Name: Cynthia Dean MRN: [id number] DOB: [birth year] Today's Date: 03/29/2022  History of Present Illness  Cynthia Dean is a 71 y.o. African-American female with medical history significant for COPD, carotid stenosis, type 2 diabetes mellitus, end-stage renal disease on hemodialysis on MWF, hypertension and dyslipidemia, as well as invasive ductal carcinoma of the left breast status post surgical excision at [facility] recently, who presented to the emergency room with acute onset of generalized weakness and fatigue for the last couple of days.  Transferred to ICU 9/4 d/t hypotension.  Clinical Impression  PT re-evaluation performed d/t pt transfer to ICU (new PT order received); PT/OT co-session performed.  Prior to hospital admission, pt was ambulatory with walker.  Pt reporting no pain during session.  Currently pt is CGA semi-supine to sitting edge of bed; min assist with sit to stand transfer using RW; and min assist x2 to take steps bed to recliner with RW use (L knee buckling noted limiting ambulation distance).  Pt did not have supplemental O2 donned upon therapy arrival and O2 sats 88-94% on room air at rest but desaturated to mid 80's with mobility/activity (nurse notified).  Pt would benefit from skilled PT to address noted impairments and functional limitations (see below for any additional details).  Upon hospital discharge, pt would benefit from SNF.  PT plan of care reviewed and updated as appropriate.   Recommendations for follow up therapy are one component of a multi-disciplinary discharge planning process, led by the attending physician.  Recommendations may be updated based on patient status, additional functional criteria and insurance authorization.  Follow Up Recommendations Skilled nursing-short term rehab (<3 hours/day) Can patient physically be transported by private vehicle: No    Assistance Recommended at Discharge  Frequent or constant Supervision/Assistance  Patient can return home with the following  A lot of help with walking and/or transfers;A lot of help with bathing/dressing/bathroom;Assistance with cooking/housework;Help with stairs or ramp for entrance;Assist for transportation    Equipment Recommendations Rolling walker (2 wheels);BSC/3in1;Wheelchair (measurements PT);Wheelchair cushion (measurements PT)  Recommendations for Other Services       Functional Status Assessment Patient has had a recent decline in their functional status and demonstrates the ability to make significant improvements in function in a reasonable and predictable amount of time.     Precautions / Restrictions Precautions Precautions: Fall Precaution Comments: watch L knee buckling; L UE AV fistula Restrictions Weight Bearing Restrictions: No      Mobility  Bed Mobility Overal bed mobility: Needs Assistance Bed Mobility: Supine to Sit     Supine to sit: Min guard     General bed mobility comments: increased time/effort to perform on own    Transfers Overall transfer level: Needs assistance Equipment used: Rolling walker (2 wheels) Transfers: Sit to/from Stand, Bed to chair/wheelchair/BSC Sit to Stand: Min assist   Step pivot transfers: Min assist, +2 physical assistance       General transfer comment: vc's for UE/LE placement; assist to initiate stand up to RW and control descent sitting; L knee mildly buckling taking steps bed to recliner with RW use (increased effort to advance R LE d/t this)    Ambulation/Gait Ambulation/Gait assistance:  (not appropriate at this time d/t L knee buckling and pt fatigue)                Stairs            Wheelchair Mobility    Modified Rankin (Stroke Patients Only)  Balance Overall balance assessment: Needs assistance Sitting-balance support: Feet supported, No upper extremity supported Sitting balance-Leahy Scale: Good Sitting  balance - Comments: steady sitting reaching within and outside BOS   Standing balance support: Bilateral upper extremity supported, Reliant on assistive device for balance Standing balance-Leahy Scale: Poor Standing balance comment: assist to steady with RW use                             Pertinent Vitals/Pain Pain Assessment Pain Assessment: No/denies pain Pain Intervention(s): Limited activity within patient's tolerance, Monitored during session, Repositioned HR stable and WFL throughout treatment session.    Home Living Family/patient expects to be discharged to:: Private residence Living Arrangements: Spouse/significant other;Children Available Help at Discharge: Family;Available PRN/intermittently Type of Home: House Home Access: Stairs to enter Entrance Stairs-Rails: Right Entrance Stairs-Number of Steps: 5   Home Layout: One level Home Equipment: Conservation officer, nature (2 wheels) Additional Comments: lives with boyfriend and daughter    Prior Function Prior Level of Function : Needs assist       Physical Assist : ADLs (physical)   ADLs (physical): Grooming;Bathing;Dressing;Toileting Mobility Comments: PRN assist for stairs ADLs Comments: PRN assist for LBD     Hand Dominance        Extremity/Trunk Assessment   Upper Extremity Assessment Upper Extremity Assessment: Generalized weakness    Lower Extremity Assessment Lower Extremity Assessment: Generalized weakness    Cervical / Trunk Assessment Cervical / Trunk Assessment: Normal  Communication   Communication: No difficulties  Cognition Arousal/Alertness: Awake/alert Behavior During Therapy: Flat affect Overall Cognitive Status: Impaired/Different from baseline Area of Impairment: Following commands, Safety/judgement                       Following Commands: Follows one step commands with increased time Safety/Judgement: Decreased awareness of safety, Decreased awareness of  deficits Awareness: Emergent Problem Solving: Slow processing, Decreased initiation, Difficulty sequencing, Requires verbal cues, Requires tactile cues          General Comments General comments (skin integrity, edema, etc.): Pt did not have supplemental O2 donned upon therapy arrival.  Nursing cleared pt for participation in physical therapy.  Pt agreeable to PT session.  Pt's daughter (and also a female visitor) present during session.    Exercises     Assessment/Plan    PT Assessment Patient needs continued PT services  PT Problem List Decreased strength;Decreased activity tolerance;Decreased balance;Decreased mobility;Decreased cognition;Decreased knowledge of use of DME;Decreased safety awareness;Decreased knowledge of precautions;Cardiopulmonary status limiting activity       PT Treatment Interventions DME instruction;Therapeutic exercise;Gait training;Balance training;Stair training;Neuromuscular re-education;Functional mobility training;Therapeutic activities;Patient/family education    PT Goals (Current goals can be found in the Care Plan section)  Acute Rehab PT Goals Patient Stated Goal: improve strength and mobility PT Goal Formulation: With patient Time For Goal Achievement: 04/12/22 Potential to Achieve Goals: Fair    Frequency Min 2X/week     Co-evaluation PT/OT/SLP Co-Evaluation/Treatment: Yes Reason for Co-Treatment: Necessary to address cognition/behavior during functional activity;For patient/therapist safety;To address functional/ADL transfers PT goals addressed during session: Mobility/safety with mobility;Proper use of DME OT goals addressed during session: ADL's and self-care       AM-PAC PT "6 Clicks" Mobility  Outcome Measure Help needed turning from your back to your side while in a flat bed without using bedrails?: A Little Help needed moving from lying on your back to sitting on the side of a flat bed  without using bedrails?: A Little Help needed  moving to and from a bed to a chair (including a wheelchair)?: A Little Help needed standing up from a chair using your arms (e.g., wheelchair or bedside chair)?: A Little Help needed to walk in hospital room?: A Lot Help needed climbing 3-5 steps with a railing? : Total 6 Click Score: 15    End of Session Equipment Utilized During Treatment: Gait belt Activity Tolerance: Patient limited by fatigue Patient left: in chair;with call bell/phone within reach;with chair alarm set;with family/visitor present Nurse Communication: Mobility status;Precautions;Other (comment) (pt's O2 sats and pt did not have any supplemental O2 on upon therapy arrival; also L knee buckling and mobility assist levels) PT Visit Diagnosis: Other abnormalities of gait and mobility (R26.89);Muscle weakness (generalized) (M62.81);Difficulty in walking, not elsewhere classified (R26.2)    Time: 0923-3007 PT Time Calculation (min) (ACUTE ONLY): 27 min   Charges:   PT Evaluation $PT Re-evaluation: 1 Re-eval         Leitha Bleak, PT 03/29/22, 5:48 PM

## 2022-03-29 NOTE — Progress Notes (Signed)
Date of Admission:  03/19/2022    ID: Anshi Jalloh is a 71 y.o. 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)   ESRD on hemodialysis (HCC)   Dyslipidemia   Diabetic neuropathy (HCC)   Sepsis due to cellulitis (HCC)   Urinary tract infection   Anemia of chronic disease   Left breast abscess   Seroma of breast   Bacteremia due to coagulase-negative Staphylococcus    Subjective: Hypotensive yesterday after dialysis , hence in step down stable  Medications:   sodium chloride   Intravenous Once   apixaban  10 mg Oral BID   Followed by   Derrill Memo ON 04/03/2022] apixaban  5 mg Oral BID   atorvastatin  80 mg Oral QPM   calcium acetate  667 mg Oral TID WC   Chlorhexidine Gluconate Cloth  6 each Topical Q0600   vitamin D3  1,000 Units Oral Daily   cinacalcet  30 mg Oral Q breakfast   docusate sodium  200 mg Oral BID   epoetin (EPOGEN/PROCRIT) injection  10,000 Units Intravenous Q M,W,F-HD   fluticasone  1 spray Each Nare Daily   gabapentin  100 mg Oral BID   insulin aspart  0-6 Units Subcutaneous TID WC   insulin aspart protamine- aspart  10 Units Subcutaneous BID WC   levETIRAcetam  1,000 mg Oral Daily   levETIRAcetam  250 mg Oral Once per day on Mon Wed Fri   [START ON 03/30/2022] metoprolol succinate  50 mg Oral Q breakfast   pantoprazole  40 mg Oral Daily   polyethylene glycol  17 g Oral Daily   [START ON 03/31/2022] torsemide  40 mg Oral Once per day on Sun Tue Thu Sat    Objective: Vital signs in last 24 hours: Temp:  [97.9 F (36.6 C)-99.3 F (37.4 C)] 98 F (36.7 C) (09/05 1647) Pulse Rate:  [64-84] 71 (09/05 1700) Resp:  [11-30] 16 (09/05 1700) BP: (43-156)/(25-64) 152/64 (09/05 1700) SpO2:  [87 %-99 %] 97 % (09/05 1700)   PHYSICAL EXAM:  General: awake and alert- no distress Lungs: b/l air  entry Heart: s1s2 Abdomen: Soft, non-tender,not distended. Bowel sounds normal. No masses Extremities: atraumatic, no cyanosis. No edema. No clubbing Skin: No rashes or lesions. Or bruising Lymph: Cervical, supraclavicular normal. Neurologic: Grossly non-focal Left breast -less swollen indurated Left axillary area- No serous fluid oozing Lab Results Recent Labs    03/27/22 0447 03/28/22 1000  WBC 11.9*  --   HGB 8.2* 7.7*  HCT 26.4* 25.2*    Microbiology: BC- staph capitis      Assessment/Plan: 71 yr female with ESRD, left breast ca s/p partial mastectomy and left axillary dissection on 8/11 presenting fever and confusion and weakness   Staph capitis bacteremia- on cefazolin- repeat blood culture neg Left breast collection - aspirated and culture NG Will treat with cefazolin until 04/03/22 during dialysis ( 2 grams on Monday, 2 grams on wed and 3 grams on Friday)  Ca breast left s/p partial mastectomy and left axillary dissection   Left breast and axillary collection- thought to be hematoma- aspiration by IR- 135 cc obtained and sent for culture. No growth    Encephalopathy - resolved   B/l small PE- no hypoxia   ESRD on dialysis  Discussed the management with patient and  her husband ID will sign off-  call if needed

## 2022-03-30 DIAGNOSIS — N61 Mastitis without abscess: Secondary | ICD-10-CM

## 2022-03-30 DIAGNOSIS — R7881 Bacteremia: Secondary | ICD-10-CM | POA: Diagnosis not present

## 2022-03-30 DIAGNOSIS — I2699 Other pulmonary embolism without acute cor pulmonale: Secondary | ICD-10-CM | POA: Diagnosis not present

## 2022-03-30 DIAGNOSIS — B957 Other staphylococcus as the cause of diseases classified elsewhere: Secondary | ICD-10-CM | POA: Diagnosis not present

## 2022-03-30 LAB — CBC WITH DIFFERENTIAL/PLATELET
Abs Immature Granulocytes: 0.25 10*3/uL — ABNORMAL HIGH (ref 0.00–0.07)
Basophils Absolute: 0.1 10*3/uL (ref 0.0–0.1)
Basophils Relative: 1 %
Eosinophils Absolute: 0.2 10*3/uL (ref 0.0–0.5)
Eosinophils Relative: 1 %
HCT: 27.9 % — ABNORMAL LOW (ref 36.0–46.0)
Hemoglobin: 8.6 g/dL — ABNORMAL LOW (ref 12.0–15.0)
Immature Granulocytes: 2 %
Lymphocytes Relative: 22 %
Lymphs Abs: 2.5 10*3/uL (ref 0.7–4.0)
MCH: 27.3 pg (ref 26.0–34.0)
MCHC: 30.8 g/dL (ref 30.0–36.0)
MCV: 88.6 fL (ref 80.0–100.0)
Monocytes Absolute: 1.2 10*3/uL — ABNORMAL HIGH (ref 0.1–1.0)
Monocytes Relative: 11 %
Neutro Abs: 7.1 10*3/uL (ref 1.7–7.7)
Neutrophils Relative %: 63 %
Platelets: 264 10*3/uL (ref 150–400)
RBC: 3.15 MIL/uL — ABNORMAL LOW (ref 3.87–5.11)
RDW: 17.5 % — ABNORMAL HIGH (ref 11.5–15.5)
WBC: 11.3 10*3/uL — ABNORMAL HIGH (ref 4.0–10.5)
nRBC: 0 % (ref 0.0–0.2)

## 2022-03-30 LAB — AEROBIC/ANAEROBIC CULTURE W GRAM STAIN (SURGICAL/DEEP WOUND): Culture: NO GROWTH

## 2022-03-30 LAB — RENAL FUNCTION PANEL
Albumin: 2.8 g/dL — ABNORMAL LOW (ref 3.5–5.0)
Anion gap: 9 (ref 5–15)
BUN: 22 mg/dL (ref 8–23)
CO2: 28 mmol/L (ref 22–32)
Calcium: 9.9 mg/dL (ref 8.9–10.3)
Chloride: 101 mmol/L (ref 98–111)
Creatinine, Ser: 8.22 mg/dL — ABNORMAL HIGH (ref 0.44–1.00)
GFR, Estimated: 5 mL/min — ABNORMAL LOW (ref >60)
Glucose, Bld: 130 mg/dL — ABNORMAL HIGH (ref 70–99)
Phosphorus: 3.5 mg/dL (ref 2.5–4.6)
Potassium: 4.4 mmol/L (ref 3.5–5.1)
Sodium: 138 mmol/L (ref 135–145)

## 2022-03-30 LAB — GLUCOSE, CAPILLARY
Glucose-Capillary: 115 mg/dL — ABNORMAL HIGH (ref 70–99)
Glucose-Capillary: 141 mg/dL — ABNORMAL HIGH (ref 70–99)
Glucose-Capillary: 141 mg/dL — ABNORMAL HIGH (ref 70–99)
Glucose-Capillary: 122 mg/dL — ABNORMAL HIGH (ref 70–99)

## 2022-03-30 NOTE — Progress Notes (Signed)
Telephone report called to Cynthia Greet, RN.  Patient to be moved to room 235.

## 2022-03-30 NOTE — Progress Notes (Signed)
Freddie Breech ,RN secured chatted this nurse.  Patient to be taken to dialysis and then moved to room 235.  Crystal Grogan added to secure chat and is aware.

## 2022-03-30 NOTE — Progress Notes (Addendum)
[facility]  ROUNDING NOTE   Subjective:   Cynthia Dean is a [age]-year-old female with past medical history including COPD, diabetes, carotid stenosis, hypertension, and end-stage renal disease on hemodialysis.  Patient presents to the emergency department with complaints of weakness, fatigue and fever.  Patient has been admitted for ESRD on hemodialysis ([place]) [N18.6, Z99.2] Acute pulmonary embolism without acute cor pulmonale ([place]) [I26.99] Sepsis with acute hypoxic respiratory failure and septic shock, due to unspecified organism ([place]) [A41.9, R65.21, J96.01]  Patient is known to our practice and receives outpatient dialysis treatments at [facility] on a TTS schedule, supervised by Dr. Candiss Norse.   Patient seen sitting in chair, family at bedside Completed breakfast tray on table Denies pain and discomfort Weaned to room air  Objective:  Vital signs in last 24 hours:  Temp:  [98 F (36.7 C)-98.4 F (36.9 C)] 98.2 F (36.8 C) (09/06 0745) Pulse Rate:  [64-73] 64 (09/06 0700) Resp:  [7-20] 16 (09/06 0700) BP: (114-174)/(40-73) 158/66 (09/06 0700) SpO2:  [90 %-99 %] 97 % (09/06 0745)  Weight change:  Filed Weights   03/25/22 0342 03/25/22 1149 03/25/22 1621  Weight: 118.5 kg 116 kg 115 kg    Intake/Output: I/O last 3 completed shifts: In: 898.2 [P.O.:480; I.V.:113.6; IV Piggyback:304.5] Out: 0    Intake/Output this shift:  Total I/O In: 0.4 [IV Piggyback:0.4] Out: -   Physical Exam: General: NAD, resting comfortably  Head: Normocephalic, atraumatic. Moist oral mucosal membranes  Eyes: Anicteric  Lungs:  Clear to auscultation, normal effort  Heart: Regular rate and rhythm  Abdomen:  Soft, tender in the lower abdominal area, obese  Extremities: trace peripheral edema.  Neurologic: Alert, oriented to person  Skin: No lesions  Access: Left aVG    Basic Metabolic Panel: Recent Labs  Lab 03/25/22 0523 03/30/22 0753  NA 137 138  K  4.2 4.4  CL 101 101  CO2 25 28  GLUCOSE 119* 130*  BUN 27* 22  CREATININE 8.43* 8.22*  CALCIUM 9.4 9.9  PHOS  --  3.5     Liver Function Tests: Recent Labs  Lab 03/25/22 0523 03/30/22 0753  AST 14*  --   ALT 8  --   ALKPHOS 73  --   BILITOT 0.7  --   PROT 6.1*  --   ALBUMIN 2.6* 2.8*    No results for input(s): "LIPASE", "AMYLASE" in the last 168 hours. No results for input(s): "AMMONIA" in the last 168 hours.  CBC: Recent Labs  Lab 03/25/22 0523 03/26/22 0714 03/27/22 0447 03/28/22 1000 03/29/22 1916 03/30/22 0753  WBC 10.9* 13.1* 11.9*  --  12.0* 11.3*  NEUTROABS  --   --   --   --   --  7.1  HGB 7.2* 8.3* 8.2* 7.7* 9.0* 8.6*  HCT 23.3* 26.6* 26.4* 25.2* 29.7* 27.9*  MCV 89.6 87.5 88.3  --  89.7 88.6  PLT 255 267 277  --  255 264     Cardiac Enzymes: No results for input(s): "CKTOTAL", "CKMB", "CKMBINDEX", "TROPONINI" in the last 168 hours.  BNP: Invalid input(s): "POCBNP"  CBG: Recent Labs  Lab 03/29/22 0756 03/29/22 1139 03/29/22 1654 03/29/22 2031 03/30/22 0802  GLUCAP 111* 142* 151* 144* 115*     Microbiology: Results for orders placed or performed during the hospital encounter of 03/19/22  Resp Panel by RT-PCR (Flu A&B, Covid) Anterior Nasal Swab     Status: None   Collection Time: 03/19/22  9:28 PM  Specimen: Anterior Nasal Swab  Result Value Ref Range Status   SARS Coronavirus 2 by RT PCR NEGATIVE NEGATIVE Final    Comment: (NOTE) SARS-CoV-2 target nucleic acids are NOT DETECTED.  The SARS-CoV-2 RNA is generally detectable in upper respiratory specimens during the acute phase of infection. The lowest concentration of SARS-CoV-2 viral copies this assay can detect is 138 copies/mL. A negative result does not preclude SARS-Cov-2 infection and should not be used as the sole basis for treatment or other patient management decisions. A negative result may occur with  improper specimen collection/handling, submission of specimen  other than nasopharyngeal swab, presence of viral mutation(s) within the areas targeted by this assay, and inadequate number of viral copies(<138 copies/mL). A negative result must be combined with clinical observations, patient history, and epidemiological information. The expected result is Negative.  Fact Sheet for Patients:  EntrepreneurPulse.com.au  Fact Sheet for Healthcare Providers:  IncredibleEmployment.be  This test is no t yet approved or cleared by the [place] FDA and  has been authorized for detection and/or diagnosis of SARS-CoV-2 by FDA under an Emergency Use Authorization (EUA). This EUA will remain  in effect (meaning this test can be used) for the duration of the COVID-19 declaration under Section 564(b)(1) of the Act, 21 U.S.C.section 360bbb-3(b)(1), unless the authorization is terminated  or revoked sooner.       Influenza A by PCR NEGATIVE NEGATIVE Final   Influenza B by PCR NEGATIVE NEGATIVE Final    Comment: (NOTE) The Xpert Xpress SARS-CoV-2/FLU/RSV plus assay is intended as an aid in the diagnosis of influenza from Nasopharyngeal swab specimens and should not be used as a sole basis for treatment. Nasal washings and aspirates are unacceptable for Xpert Xpress SARS-CoV-2/FLU/RSV testing.  Fact Sheet for Patients: EntrepreneurPulse.com.au  Fact Sheet for Healthcare Providers: IncredibleEmployment.be  This test is not yet approved or cleared by the [place] FDA and has been authorized for detection and/or diagnosis of SARS-CoV-2 by FDA under an Emergency Use Authorization (EUA). This EUA will remain in effect (meaning this test can be used) for the duration of the COVID-19 declaration under Section 564(b)(1) of the Act, 21 U.S.C. section 360bbb-3(b)(1), unless the authorization is terminated or revoked.  Performed at [facility], [address],  [place], [place] [address]   Blood Culture (routine x 2)     Status: Abnormal   Collection Time: 03/19/22  9:28 PM   Specimen: BLOOD  Result Value Ref Range Status   Specimen Description   Final    BLOOD RIGHT ANTECUBITAL Performed at [facility], [address], [place], [place] [address]    Special Requests   Final    BOTTLES DRAWN AEROBIC AND ANAEROBIC Blood Culture adequate volume Performed at [facility], [address], [place], [place] [address]    Culture  Setup Time   Final    GRAM POSITIVE COCCI IN BOTH AEROBIC AND ANAEROBIC BOTTLES CRITICAL RESULT CALLED TO, READ BACK BY AND VERIFIED WITH: CAROLYN CHILDS 03/20/22 1457 AMK Performed at [facility], [address] [place],  [address]    Culture STAPHYLOCOCCUS CAPITIS (A)  Final   Report Status 03/23/2022 FINAL  Final   Organism ID, Bacteria STAPHYLOCOCCUS CAPITIS  Final      Susceptibility   Staphylococcus capitis - MIC*    CIPROFLOXACIN <=0.5 SENSITIVE Sensitive     ERYTHROMYCIN <=0.25 SENSITIVE Sensitive     GENTAMICIN <=0.5 SENSITIVE Sensitive     OXACILLIN <=0.25 SENSITIVE Sensitive  TETRACYCLINE <=1 SENSITIVE Sensitive     VANCOMYCIN <=0.5 SENSITIVE Sensitive     TRIMETH/SULFA <=10 SENSITIVE Sensitive     CLINDAMYCIN <=0.25 SENSITIVE Sensitive     RIFAMPIN <=0.5 SENSITIVE Sensitive     Inducible Clindamycin NEGATIVE Sensitive     * STAPHYLOCOCCUS CAPITIS  Blood Culture ID Panel (Reflexed)     Status: Abnormal   Collection Time: 03/19/22  9:28 PM  Result Value Ref Range Status   Enterococcus faecalis NOT DETECTED NOT DETECTED Final   Enterococcus Faecium NOT DETECTED NOT DETECTED Final   Listeria monocytogenes NOT DETECTED NOT DETECTED Final   Staphylococcus species DETECTED (A) NOT DETECTED Final    Comment: CRITICAL RESULT CALLED TO, READ BACK BY AND VERIFIED WITH: CAROLYN CHILDS 03/20/22 1457 AMK    Staphylococcus aureus (BCID) NOT DETECTED NOT DETECTED Final    Staphylococcus epidermidis NOT DETECTED NOT DETECTED Final   Staphylococcus lugdunensis NOT DETECTED NOT DETECTED Final   Streptococcus species NOT DETECTED NOT DETECTED Final   Streptococcus agalactiae NOT DETECTED NOT DETECTED Final   Streptococcus pneumoniae NOT DETECTED NOT DETECTED Final   Streptococcus pyogenes NOT DETECTED NOT DETECTED Final   A.calcoaceticus-baumannii NOT DETECTED NOT DETECTED Final   Bacteroides fragilis NOT DETECTED NOT DETECTED Final   Enterobacterales NOT DETECTED NOT DETECTED Final   Enterobacter cloacae complex NOT DETECTED NOT DETECTED Final   Escherichia coli NOT DETECTED NOT DETECTED Final   Klebsiella aerogenes NOT DETECTED NOT DETECTED Final   Klebsiella oxytoca NOT DETECTED NOT DETECTED Final   Klebsiella pneumoniae NOT DETECTED NOT DETECTED Final   Proteus species NOT DETECTED NOT DETECTED Final   Salmonella species NOT DETECTED NOT DETECTED Final   Serratia marcescens NOT DETECTED NOT DETECTED Final   Haemophilus influenzae NOT DETECTED NOT DETECTED Final   Neisseria meningitidis NOT DETECTED NOT DETECTED Final   Pseudomonas aeruginosa NOT DETECTED NOT DETECTED Final   Stenotrophomonas maltophilia NOT DETECTED NOT DETECTED Final   Candida albicans NOT DETECTED NOT DETECTED Final   Candida auris NOT DETECTED NOT DETECTED Final   Candida glabrata NOT DETECTED NOT DETECTED Final   Candida krusei NOT DETECTED NOT DETECTED Final   Candida parapsilosis NOT DETECTED NOT DETECTED Final   Candida tropicalis NOT DETECTED NOT DETECTED Final   Cryptococcus neoformans/gattii NOT DETECTED NOT DETECTED Final    Comment: Performed at [facility], [address] [place], [place] [address]  Blood Culture (routine x 2)     Status: Abnormal   Collection Time: 03/19/22 10:23 PM   Specimen: BLOOD  Result Value Ref Range Status   Specimen Description   Final    BLOOD BLOOD RIGHT FOREARM Performed at [facility], [address],  [place], [place] [address]    Special Requests   Final    BOTTLES DRAWN AEROBIC AND ANAEROBIC Blood Culture adequate volume Performed at [facility], [address] [place], [place] [address]    Culture  Setup Time   Final    GRAM POSITIVE COCCI IN BOTH AEROBIC AND ANAEROBIC BOTTLES CRITICAL RESULT CALLED TO, READ BACK BY AND VERIFIED WITH: ELVERA MADUEME 03/20/22 1545 AMK Performed at [facility], [address] [place], [place] [address]    Culture (A)  Final    STAPHYLOCOCCUS CAPITIS SUSCEPTIBILITIES PERFORMED ON PREVIOUS CULTURE WITHIN THE LAST 5 DAYS. Performed at [facility], [place] [address] [place],  [address]    Report Status 03/22/2022 FINAL  Final  Urine Culture     Status:  Abnormal   Collection Time: 03/20/22  1:25 AM   Specimen: In/Out Cath Urine  Result Value Ref Range Status   Specimen Description   Final    IN/OUT CATH URINE Performed at [facility], [address] [place], [place] [address]    Special Requests   Final    NONE Performed at [facility], [address] [place], [place] [address]    Culture 4,000 COLONIES/mL STAPHYLOCOCCUS EPIDERMIDIS (A)  Final   Report Status 03/22/2022 FINAL  Final   Organism ID, Bacteria STAPHYLOCOCCUS EPIDERMIDIS (A)  Final      Susceptibility   Staphylococcus epidermidis - MIC*    CIPROFLOXACIN <=0.5 SENSITIVE Sensitive     GENTAMICIN <=0.5 SENSITIVE Sensitive     NITROFURANTOIN <=16 SENSITIVE Sensitive     OXACILLIN <=0.25 SENSITIVE Sensitive     TETRACYCLINE <=1 SENSITIVE Sensitive     VANCOMYCIN 2 SENSITIVE Sensitive     TRIMETH/SULFA 80 RESISTANT Resistant     CLINDAMYCIN >=8 RESISTANT Resistant     RIFAMPIN <=0.5 SENSITIVE Sensitive     Inducible Clindamycin NEGATIVE Sensitive     * 4,000 COLONIES/mL STAPHYLOCOCCUS EPIDERMIDIS  Culture, blood (Routine X 2) w Reflex to ID Panel     Status: None   Collection Time: 03/21/22 11:00 AM   Specimen: BLOOD  Result  Value Ref Range Status   Specimen Description BLOOD BLOOD RIGHT FOREARM  Final   Special Requests   Final    BOTTLES DRAWN AEROBIC AND ANAEROBIC Blood Culture adequate volume   Culture   Final    NO GROWTH 5 DAYS Performed at [facility], [address] [place], [place] [address]    Report Status 03/26/2022 FINAL  Final  Culture, blood (Routine X 2) w Reflex to ID Panel     Status: None   Collection Time: 03/22/22 11:53 AM   Specimen: BLOOD RIGHT HAND  Result Value Ref Range Status   Specimen Description BLOOD RIGHT HAND  Final   Special Requests   Final    BOTTLES DRAWN AEROBIC AND ANAEROBIC Blood Culture adequate volume   Culture   Final    NO GROWTH 5 DAYS Performed at [facility], [address], [place], [place] [address]    Report Status 03/27/2022 FINAL  Final  Aerobic/Anaerobic Culture w Gram Stain (surgical/deep wound)     Status: None   Collection Time: 03/25/22 10:30 AM   Specimen: Wound; Blood  Result Value Ref Range Status   Specimen Description WOUND LEFT BREAST  Final   Special Requests BLOOD HEMATOMA  Final   Gram Stain   Final    RARE WBC PRESENT, PREDOMINANTLY MONONUCLEAR NO ORGANISMS SEEN    Culture   Final    No growth aerobically or anaerobically. Performed at [facility], [place] [address] [place], [place] [address]    Report Status 03/30/2022 FINAL  Final    Coagulation Studies: No results for input(s): "LABPROT", "INR" in the last 72 hours.   Urinalysis: No results for input(s): "COLORURINE", "LABSPEC", "PHURINE", "GLUCOSEU", "HGBUR", "BILIRUBINUR", "KETONESUR", "PROTEINUR", "UROBILINOGEN", "NITRITE", "LEUKOCYTESUR" in the last 72 hours.  Invalid input(s): "APPERANCEUR"     Imaging: No results found.   Medications:    sodium chloride 10 mL/hr at 03/30/22 0808    ceFAZolin (ANCEF) IV Stopped (03/29/22 2000)    apixaban  10 mg Oral BID   Followed by   Derrill Memo ON 04/03/2022] apixaban  5 mg Oral BID    atorvastatin  80 mg Oral QPM   calcium acetate  667 mg Oral TID WC   Chlorhexidine Gluconate Cloth  6 each Topical Q0600   vitamin D3  1,000 Units Oral Daily   cinacalcet  30 mg Oral Q breakfast   docusate sodium  200 mg Oral BID   epoetin (EPOGEN/PROCRIT) injection  10,000 Units Intravenous Q M,W,F-HD   fluticasone  1 spray Each Nare Daily   gabapentin  100 mg Oral BID   insulin aspart  0-6 Units Subcutaneous TID WC   insulin aspart protamine- aspart  10 Units Subcutaneous BID WC   levETIRAcetam  1,000 mg Oral Daily   levETIRAcetam  250 mg Oral Once per day on Mon Wed Fri   metoprolol succinate  50 mg Oral Q breakfast   pantoprazole  40 mg Oral Daily   polyethylene glycol  17 g Oral Daily   [START ON 03/31/2022] torsemide  40 mg Oral Once per day on Sun Tue Thu Sat   sodium chloride, acetaminophen **OR** acetaminophen, diphenoxylate-atropine, glucose, ipratropium-albuterol, lidocaine-prilocaine, magnesium hydroxide, metoCLOPramide (REGLAN) injection, oxyCODONE, traZODone  Assessment/ Plan:  Cynthia Dean is a 71 y.o.  female ast medical history including COPD, diabetes, carotid stenosis, hypertension, and end-stage renal disease on hemodialysis.  Patient presents to the emergency department with complaints of weakness, fatigue and fever.  Patient has been admitted for ESRD on hemodialysis ([place]) [N18.6, Z99.2] Acute pulmonary embolism without acute cor pulmonale ([place]) [I26.99] Sepsis with acute hypoxic respiratory failure and septic shock, due to unspecified organism ([place]) [A41.9, R65.21, J96.01]  [facility]/MWF/left aVF  End-stage renal disease on hemodialysis.  Scheduled to receive dialysis later today. Next treatment scheduled for Friday.   2. Anemia of chronic kidney disease Lab Results  Component Value Date   HGB 8.6 (L) 03/30/2022    Patient receives [facility] outpatient.  Received blood transfusion during this admission.  Hgb just below desired  target. Continue EPO with treatments.   3. Secondary Hyperparathyroidism: with outpatient labs: PTH 613, phosphorus 4.8, calcium 9.0 on 03/07/2022.   Lab Results  Component Value Date   PTH 131 (H) 03/14/2018   CALCIUM 9.9 03/30/2022   PHOS 3.5 03/30/2022   Calcium acetate reduced yesterday due to lower abdominal pain   4. Diabetes mellitus type II with chronic kidney disease: insulin dependent. Home regimen includes Novolin. Most recent hemoglobin A1c is 7.5 on 01/29/20.  Glucose stable at this time.  Primary team to manage sliding scale insulin.  5.  Hypertension with chronic kidney disease.  Home regimen includes losartan, metoprolol, and torsemide.     Receiving daily metoprolol and Torsemide on nondialysis days only.   6. Left breast cellulitis. Left breast US shows seroma vs abcess. Culture grew staph capitis. Being treated with cefazolin. ID recommending continued IV cefazolin with dialysis, 2g/2g/3g, until 04/03/22.   LOS: [place] 9/6/202310:40 AM

## 2022-03-30 NOTE — Progress Notes (Signed)
Hd tx of 3.5hrs completed. 73.1L total vol processed. No complications. Report given to crystal grogan, rn. Total uf removed: 1563m Post hd wt: 120.3kg Post hd v/s: 98.0 135/58(79) 74 20 100%

## 2022-03-30 NOTE — Progress Notes (Signed)
PROGRESS NOTE    Cynthia Dean  IWP:[id number] DOB: [birth year] DOA: 03/19/2022 PCP: Lowella Bandy, MD    Assessment & Plan:   Principal Problem:   Acute pulmonary embolism without acute cor pulmonale (HCC) Active Problems:   Sepsis due to cellulitis ([place])   ESRD on hemodialysis ([place])   Essential hypertension   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[place])   Dyslipidemia   Diabetic neuropathy (HCC)   Urinary tract infection   Anemia of chronic disease   Left breast abscess   Seroma of breast   Bacteremia due to coagulase-negative Staphylococcus  Assessment and Plan: Acute pulmonary embolism: without acute cor pulmonale. D/c IV heparin and started eliquis.  8/30 aspirin Plavix on hold.    ACD: likely secondary to ESRD.  W/ possible component of acute blood loss anemia. S/p 2 unit of pRBCs transfused so far. Will continue to monitor H&H  Sepsis: likely secondary to left breast cellulitis. Resolved  Mild cellulitis of left breast: continue on IV cefazolin as per ID. S/p IR aspiration of hematoma on 03/25/22. Cx shows NGTD  Staph capitis bacteremia: likely from breast as per ID. Continue on IV cefazolin as per ID. Repeat blood cxs NGTD   Possible UTI: urine cx staph epidermidis. Treated w/ IV doxy.   ESRD: on HD MWF. Continue on torsemide. Nephro following and recs apprec   Constipation: continue on colace, miralax    Breast cancer: recent dx. S/p surgical excision at [facility] & chemo. High risk of blood clots w/ hx of cancer    Diabetic neuropathy: continue on home dose of gabapentin    HLD: continue on statin    DM2: well controlled, HbA1c 5.8. Continue on 70/30, SSI w/ accuchecks    HTN: continue reduced dose of metoprolol & torsemide    Obesity: BMI 39.6. Complicates overall care & prognosis     DVT prophylaxis: eliquis  Code Status: full  Family Communication:  Disposition Plan: possibly d/c to SNF   Level of care:  Stepdown  Status is: Inpatient Remains inpatient appropriate because: severity of illness    Consultants:  Nephro ID Gen surg   Procedures:   Antimicrobials: cefazolin    Subjective: Pt c/o fatigue   Objective: Vitals:   03/30/22 0500 03/30/22 0600 03/30/22 0700 03/30/22 0745  BP: (!) 146/67 (!) 151/64 (!) 158/66   Pulse: 66 65 64   Resp: '15 14 16   '$ Temp:    98.2 F (36.8 C)  TempSrc:    Oral  SpO2: 93% 96% 93% 97%  Weight:      Height:        Intake/Output Summary (Last 24 hours) at 03/30/2022 0935 Last data filed at 03/30/2022 5053 Gross per 24 hour  Intake 289.95 ml  Output --  Net 289.95 ml   Filed Weights   03/25/22 0342 03/25/22 1149 03/25/22 1621  Weight: 118.5 kg 116 kg 115 kg    Examination:  General exam: Appears calm and comfortable  Respiratory system: decreased breath sounds b/l  Cardiovascular system: S1 & S2 +. No rubs, gallops or clicks. Gastrointestinal system: Abdomen is obese, soft and nontender.  Normal bowel sounds heard. Central nervous system: Alert and oriented. Moves all extremities  Psychiatry: Judgement and insight appear normal. Flat mood and affect     Data Reviewed: I have personally reviewed following labs and imaging studies  CBC: Recent Labs  Lab 03/25/22 0523 03/26/22 0714 03/27/22 0447 03/28/22 1000 03/29/22 1916 03/30/22 0753  WBC 10.9* 13.1* 11.9*  --  12.0* 11.3*  NEUTROABS  --   --   --   --   --  7.1  HGB 7.2* 8.3* 8.2* 7.7* 9.0* 8.6*  HCT 23.3* 26.6* 26.4* 25.2* 29.7* 27.9*  MCV 89.6 87.5 88.3  --  89.7 88.6  PLT 255 267 277  --  255 503   Basic Metabolic Panel: Recent Labs  Lab 03/25/22 0523 03/30/22 0753  NA 137 138  K 4.2 4.4  CL 101 101  CO2 25 28  GLUCOSE 119* 130*  BUN 27* 22  CREATININE 8.43* 8.22*  CALCIUM 9.4 9.9  PHOS  --  3.5   GFR: Estimated Creatinine Clearance: 8.5 mL/min (A) (by C-G formula based on SCr of 8.22 mg/dL (H)). Liver Function Tests: Recent Labs  Lab  03/25/22 0523 03/30/22 0753  AST 14*  --   ALT 8  --   ALKPHOS 73  --   BILITOT 0.7  --   PROT 6.1*  --   ALBUMIN 2.6* 2.8*   No results for input(s): "LIPASE", "AMYLASE" in the last 168 hours. No results for input(s): "AMMONIA" in the last 168 hours. Coagulation Profile: Recent Labs  Lab 03/24/22 1656  INR 1.7*   Cardiac Enzymes: No results for input(s): "CKTOTAL", "CKMB", "CKMBINDEX", "TROPONINI" in the last 168 hours. BNP (last 3 results) No results for input(s): "PROBNP" in the last 8760 hours. HbA1C: No results for input(s): "HGBA1C" in the last 72 hours. CBG: Recent Labs  Lab 03/29/22 0756 03/29/22 1139 03/29/22 1654 03/29/22 2031 03/30/22 0802  GLUCAP 111* 142* 151* 144* 115*   Lipid Profile: No results for input(s): "CHOL", "HDL", "LDLCALC", "TRIG", "CHOLHDL", "LDLDIRECT" in the last 72 hours. Thyroid Function Tests: No results for input(s): "TSH", "T4TOTAL", "FREET4", "T3FREE", "THYROIDAB" in the last 72 hours. Anemia Panel: No results for input(s): "VITAMINB12", "FOLATE", "FERRITIN", "TIBC", "IRON", "RETICCTPCT" in the last 72 hours. Sepsis Labs: No results for input(s): "PROCALCITON", "LATICACIDVEN" in the last 168 hours.  Recent Results (from the past 240 hour(s))  Culture, blood (Routine X 2) w Reflex to ID Panel     Status: None   Collection Time: 03/21/22 11:00 AM   Specimen: BLOOD  Result Value Ref Range Status   Specimen Description BLOOD BLOOD RIGHT FOREARM  Final   Special Requests   Final    BOTTLES DRAWN AEROBIC AND ANAEROBIC Blood Culture adequate volume   Culture   Final    NO GROWTH 5 DAYS Performed at [facility], [address] [place], [place] [address]    Report Status 03/26/2022 FINAL  Final  Culture, blood (Routine X 2) w Reflex to ID Panel     Status: None   Collection Time: 03/22/22 11:53 AM   Specimen: BLOOD RIGHT HAND  Result Value Ref Range Status   Specimen Description BLOOD RIGHT HAND  Final   Special  Requests   Final    BOTTLES DRAWN AEROBIC AND ANAEROBIC Blood Culture adequate volume   Culture   Final    NO GROWTH 5 DAYS Performed at [facility], [address], [place], [place] [address]    Report Status 03/27/2022 FINAL  Final  Aerobic/Anaerobic Culture w Gram Stain (surgical/deep wound)     Status: None   Collection Time: 03/25/22 10:30 AM   Specimen: Wound; Blood  Result Value Ref Range Status   Specimen Description WOUND LEFT BREAST  Final   Special Requests BLOOD HEMATOMA  Final   Gram Stain  Final    RARE WBC PRESENT, PREDOMINANTLY MONONUCLEAR NO ORGANISMS SEEN    Culture   Final    No growth aerobically or anaerobically. Performed at [facility], [place] [address], [place], [place] [address]    Report Status 03/30/2022 FINAL  Final         Radiology Studies: No results found.      Scheduled Meds:  apixaban  10 mg Oral BID   Followed by   Derrill Memo ON 04/03/2022] apixaban  5 mg Oral BID   atorvastatin  80 mg Oral QPM   calcium acetate  667 mg Oral TID WC   Chlorhexidine Gluconate Cloth  6 each Topical Q0600   vitamin D3  1,000 Units Oral Daily   cinacalcet  30 mg Oral Q breakfast   docusate sodium  200 mg Oral BID   epoetin (EPOGEN/PROCRIT) injection  10,000 Units Intravenous Q M,W,F-HD   fluticasone  1 spray Each Nare Daily   gabapentin  100 mg Oral BID   insulin aspart  0-6 Units Subcutaneous TID WC   insulin aspart protamine- aspart  10 Units Subcutaneous BID WC   levETIRAcetam  1,000 mg Oral Daily   levETIRAcetam  250 mg Oral Once per day on Mon Wed Fri   metoprolol succinate  50 mg Oral Q breakfast   pantoprazole  40 mg Oral Daily   polyethylene glycol  17 g Oral Daily   [START ON 03/31/2022] torsemide  40 mg Oral Once per day on Sun Tue Thu Sat   Continuous Infusions:  sodium chloride 10 mL/hr at 03/30/22 8329    ceFAZolin (ANCEF) IV Stopped (03/29/22 2000)     LOS: 10 days    Time spent: 30 mins     Wyvonnia Dusky,  MD [facility] Pager [phone]  If 7PM-7AM, please contact night-coverage [email or web address] 03/30/2022, 9:35 AM

## 2022-03-30 NOTE — Progress Notes (Signed)
Patrica Duel called and notified of patient being moved to room 235

## 2022-03-30 NOTE — Progress Notes (Signed)
PT Cancellation Note  Patient Details Name: Cynthia Dean MRN: [id number] DOB: 02-Jan-1951   Cancelled Treatment:     Pt is off floor in HD. PT will continue to follow and progress per current POC. Will attempt to tx/return next date.    Willette Pa 03/30/2022, 3:08 PM

## 2022-03-31 DIAGNOSIS — R7881 Bacteremia: Secondary | ICD-10-CM | POA: Diagnosis not present

## 2022-03-31 DIAGNOSIS — B957 Other staphylococcus as the cause of diseases classified elsewhere: Secondary | ICD-10-CM | POA: Diagnosis not present

## 2022-03-31 DIAGNOSIS — I2699 Other pulmonary embolism without acute cor pulmonale: Secondary | ICD-10-CM | POA: Diagnosis not present

## 2022-03-31 DIAGNOSIS — N61 Mastitis without abscess: Secondary | ICD-10-CM | POA: Diagnosis not present

## 2022-03-31 LAB — CBC
HCT: 27.9 % — ABNORMAL LOW (ref 36.0–46.0)
Hemoglobin: 8.5 g/dL — ABNORMAL LOW (ref 12.0–15.0)
MCH: 27 pg (ref 26.0–34.0)
MCHC: 30.5 g/dL (ref 30.0–36.0)
MCV: 88.6 fL (ref 80.0–100.0)
Platelets: 230 10*3/uL (ref 150–400)
RBC: 3.15 MIL/uL — ABNORMAL LOW (ref 3.87–5.11)
RDW: 17.4 % — ABNORMAL HIGH (ref 11.5–15.5)
WBC: 11.7 10*3/uL — ABNORMAL HIGH (ref 4.0–10.5)
nRBC: 0 % (ref 0.0–0.2)

## 2022-03-31 LAB — GLUCOSE, CAPILLARY
Glucose-Capillary: 132 mg/dL — ABNORMAL HIGH (ref 70–99)
Glucose-Capillary: 172 mg/dL — ABNORMAL HIGH (ref 70–99)
Glucose-Capillary: 121 mg/dL — ABNORMAL HIGH (ref 70–99)
Glucose-Capillary: 132 mg/dL — ABNORMAL HIGH (ref 70–99)

## 2022-03-31 LAB — BASIC METABOLIC PANEL
Anion gap: 9 (ref 5–15)
BUN: 15 mg/dL (ref 8–23)
CO2: 29 mmol/L (ref 22–32)
Calcium: 9 mg/dL (ref 8.9–10.3)
Chloride: 99 mmol/L (ref 98–111)
Creatinine, Ser: 6.01 mg/dL — ABNORMAL HIGH (ref 0.44–1.00)
GFR, Estimated: 7 mL/min — ABNORMAL LOW (ref >60)
Glucose, Bld: 170 mg/dL — ABNORMAL HIGH (ref 70–99)
Potassium: 4.1 mmol/L (ref 3.5–5.1)
Sodium: 137 mmol/L (ref 135–145)

## 2022-03-31 MED ORDER — CEFAZOLIN IN SODIUM CHLORIDE 3-0.9 GM/100ML-% IV SOLN
3.0000 g | INTRAVENOUS | Status: AC
  Administered 2022-04-01: 3 g via INTRAVENOUS
  Filled 2022-03-31: qty 100

## 2022-03-31 NOTE — Progress Notes (Signed)
PROGRESS NOTE    Cynthia Dean  KVQ:[id number] DOB: [birth year] DOA: 03/19/2022 PCP: Lowella Bandy, MD    Assessment & Plan:   Principal Problem:   Acute pulmonary embolism without acute cor pulmonale (HCC) Active Problems:   Sepsis due to cellulitis ([place])   ESRD on hemodialysis ([place])   Essential hypertension   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[place])   Dyslipidemia   Diabetic neuropathy (HCC)   Urinary tract infection   Anemia of chronic disease   Left breast abscess   Seroma of breast   Bacteremia due to coagulase-negative Staphylococcus  Assessment and Plan: Acute pulmonary embolism: without acute cor pulmonale. D/c IV heparin. Continue on eliquis   8/30 aspirin Plavix on hold.    ACD: likely secondary to ESRD.  W/ possible component of acute blood loss anemia. S/p 2 unit of pRBCs transfused so far. Will continue to monitor H&H  Sepsis: likely secondary to left breast cellulitis. Resolved  Mild cellulitis of left breast: continue on IV cefazolin until 04/03/22 as per ID. S/p IR aspiration of hematoma on 03/25/22. Cx shows NGTD  Staph capitis bacteremia: likely from breast as per ID. Continue on IV cefazolin until 04/03/22 as per ID. Repeat blood cxs NGTD  Possible UTI: urine cx staph epidermidis. Treated w/ doxy    ESRD: on HD MWF. Continue on torsemide. Nephro following and recs apprec   Constipation: continue on miralax, colace   Breast cancer: recent dx. S/p surgical excision at [facility] & chemo. High risk of blood clots w/ hx of cancer    Diabetic neuropathy: continue on home dose of gabapentin   HLD: continue on statin   DM2: HbA1c 5.8, well controlled. Continue on 70/30, SSI w/ accuchecks    HTN: continue on reduced dose of metoprolol, torsemide   Obesity: BMI 36.9. Complicates overall care & prognosis     DVT prophylaxis: eliquis  Code Status: full  Family Communication: called pt's daughter, no answer and unable to  leave a voicemail  Disposition Plan: possibly d/c to SNF   Level of care: Progressive  Status is: Inpatient Remains inpatient appropriate because: severity of illness, d/c to SNF tomorrow    Consultants:  Nephro ID Gen surg   Procedures:   Antimicrobials: cefazolin    Subjective: Pt c/o malaise   Objective: Vitals:   03/31/22 0127 03/31/22 0353 03/31/22 0412 03/31/22 0500  BP: (!) 144/71 (!) 141/58    Pulse: 76 73    Resp: 18 17    Temp: 99.1 F (37.3 C) 98.7 F (37.1 C)    TempSrc: Oral     SpO2: 92% 90% 92%   Weight:    113.4 kg  Height:        Intake/Output Summary (Last 24 hours) at 03/31/2022 0749 Last data filed at 03/30/2022 2227 Gross per 24 hour  Intake 288.12 ml  Output 1500 ml  Net -1211.88 ml   Filed Weights   03/30/22 1822 03/30/22 1849 03/31/22 0500  Weight: 120.3 kg 120.3 kg 113.4 kg    Examination:  General exam: Appears comfortable  Respiratory system: diminished breath sounds b/l Cardiovascular system: S1/S2+. No rubs or gallops Gastrointestinal system: Abd is soft, NT, obese & normal bowel sounds  Central nervous system: Alert and oriented. Moves all extremities  Psychiatry: judgement and insight appears normal. Flat mood and affect    Data Reviewed: I have personally reviewed following labs and imaging studies  CBC: Recent Labs  Lab 03/26/22 0714  03/27/22 0447 03/28/22 1000 03/29/22 1916 03/30/22 0753 03/31/22 0329  WBC 13.1* 11.9*  --  12.0* 11.3* 11.7*  NEUTROABS  --   --   --   --  7.1  --   HGB 8.3* 8.2* 7.7* 9.0* 8.6* 8.5*  HCT 26.6* 26.4* 25.2* 29.7* 27.9* 27.9*  MCV 87.5 88.3  --  89.7 88.6 88.6  PLT 267 277  --  255 264 253   Basic Metabolic Panel: Recent Labs  Lab 03/25/22 0523 03/30/22 0753 03/31/22 0329  NA 137 138 137  K 4.2 4.4 4.1  CL 101 101 99  CO2 '25 28 29  '$ GLUCOSE 119* 130* 170*  BUN 27* 22 15  CREATININE 8.43* 8.22* 6.01*  CALCIUM 9.4 9.9 9.0  PHOS  --  3.5  --    GFR: Estimated  Creatinine Clearance: 11.5 mL/min (A) (by C-G formula based on SCr of 6.01 mg/dL (H)). Liver Function Tests: Recent Labs  Lab 03/25/22 0523 03/30/22 0753  AST 14*  --   ALT 8  --   ALKPHOS 73  --   BILITOT 0.7  --   PROT 6.1*  --   ALBUMIN 2.6* 2.8*   No results for input(s): "LIPASE", "AMYLASE" in the last 168 hours. No results for input(s): "AMMONIA" in the last 168 hours. Coagulation Profile: Recent Labs  Lab 03/24/22 1656  INR 1.7*   Cardiac Enzymes: No results for input(s): "CKTOTAL", "CKMB", "CKMBINDEX", "TROPONINI" in the last 168 hours. BNP (last 3 results) No results for input(s): "PROBNP" in the last 8760 hours. HbA1C: No results for input(s): "HGBA1C" in the last 72 hours. CBG: Recent Labs  Lab 03/29/22 2031 03/30/22 0802 03/30/22 1238 03/30/22 1904 03/30/22 2011  GLUCAP 144* 115* 141* 141* 122*   Lipid Profile: No results for input(s): "CHOL", "HDL", "LDLCALC", "TRIG", "CHOLHDL", "LDLDIRECT" in the last 72 hours. Thyroid Function Tests: No results for input(s): "TSH", "T4TOTAL", "FREET4", "T3FREE", "THYROIDAB" in the last 72 hours. Anemia Panel: No results for input(s): "VITAMINB12", "FOLATE", "FERRITIN", "TIBC", "IRON", "RETICCTPCT" in the last 72 hours. Sepsis Labs: No results for input(s): "PROCALCITON", "LATICACIDVEN" in the last 168 hours.  Recent Results (from the past 240 hour(s))  Culture, blood (Routine X 2) w Reflex to ID Panel     Status: None   Collection Time: 03/21/22 11:00 AM   Specimen: BLOOD  Result Value Ref Range Status   Specimen Description BLOOD BLOOD RIGHT FOREARM  Final   Special Requests   Final    BOTTLES DRAWN AEROBIC AND ANAEROBIC Blood Culture adequate volume   Culture   Final    NO GROWTH 5 DAYS Performed at [facility], [address] [place], [place] [address]    Report Status 03/26/2022 FINAL  Final  Culture, blood (Routine X 2) w Reflex to ID Panel     Status: None   Collection Time: 03/22/22 11:53  AM   Specimen: BLOOD RIGHT HAND  Result Value Ref Range Status   Specimen Description BLOOD RIGHT HAND  Final   Special Requests   Final    BOTTLES DRAWN AEROBIC AND ANAEROBIC Blood Culture adequate volume   Culture   Final    NO GROWTH 5 DAYS Performed at [facility], [address], [place], [place] [address]    Report Status 03/27/2022 FINAL  Final  Aerobic/Anaerobic Culture w Gram Stain (surgical/deep wound)     Status: None   Collection Time: 03/25/22 10:30 AM   Specimen: Wound; Blood  Result Value  Ref Range Status   Specimen Description WOUND LEFT BREAST  Final   Special Requests BLOOD HEMATOMA  Final   Gram Stain   Final    RARE WBC PRESENT, PREDOMINANTLY MONONUCLEAR NO ORGANISMS SEEN    Culture   Final    No growth aerobically or anaerobically. Performed at [facility], [place] [address] [place], [place] [address]    Report Status 03/30/2022 FINAL  Final         Radiology Studies: No results found.      Scheduled Meds:  apixaban  10 mg Oral BID   Followed by   Derrill Memo ON 04/03/2022] apixaban  5 mg Oral BID   atorvastatin  80 mg Oral QPM   calcium acetate  667 mg Oral TID WC   Chlorhexidine Gluconate Cloth  6 each Topical Q0600   vitamin D3  1,000 Units Oral Daily   cinacalcet  30 mg Oral Q breakfast   docusate sodium  200 mg Oral BID   epoetin (EPOGEN/PROCRIT) injection  10,000 Units Intravenous Q M,W,F-HD   fluticasone  1 spray Each Nare Daily   gabapentin  100 mg Oral BID   insulin aspart  0-6 Units Subcutaneous TID WC   insulin aspart protamine- aspart  10 Units Subcutaneous BID WC   levETIRAcetam  1,000 mg Oral Daily   levETIRAcetam  250 mg Oral Once per day on Mon Wed Fri   metoprolol succinate  50 mg Oral Q breakfast   pantoprazole  40 mg Oral Daily   polyethylene glycol  17 g Oral Daily   torsemide  40 mg Oral Once per day on Sun Tue Thu Sat   Continuous Infusions:  sodium chloride 10 mL/hr at 03/30/22 0808    ceFAZolin  (ANCEF) IV Stopped (03/30/22 2227)     LOS: 11 days    Time spent: 25 mins     Wyvonnia Dusky, MD [facility] Pager [phone]  If 7PM-7AM, please contact night-coverage [email or web address] 03/31/2022, 7:49 AM

## 2022-03-31 NOTE — Progress Notes (Signed)
Pt refused skin assessment but was educated about its importance. Will  continue to monitor.

## 2022-03-31 NOTE — Plan of Care (Signed)
  Problem: Clinical Measurements: Goal: Signs and symptoms of infection will decrease Outcome: Progressing   Problem: Respiratory: Goal: Ability to maintain adequate ventilation will improve Outcome: Progressing   Problem: Pain Managment: Goal: General experience of comfort will improve Outcome: Progressing

## 2022-03-31 NOTE — Progress Notes (Signed)
Physical Therapy Treatment Patient Details Name: Cynthia Dean MRN: [id number] DOB: 07-28-1950 Today's Date: 03/31/2022   History of Present Illness Cynthia Dean is a 71 y.o. African-American female with medical history significant for COPD, carotid stenosis, type 2 diabetes mellitus, end-stage renal disease on hemodialysis on MWF, hypertension and dyslipidemia, as well as invasive ductal carcinoma of the left breast status post surgical excision at [facility] recently, who presented to the emergency room with acute onset of generalized weakness and fatigue for the last couple of days.  Transferred to ICU 9/4 d/t hypotension.    PT Comments    Pt was sitting EOB with supportive spouse and daughter present. She is A and O and agreeable to session. She was motivated throughout. Required assistance to achieve standing to RW. Ambulated ~ 12 ft prior to needing to turn around due to fatigue. Pt has severe strength deficits and knee buckling once fatigued. Required extensive assistance to safely make it back to EOB. Author had pt removed O2 throughout session with sao2 > 92 % on rm air. RN made aware post session. Recommend DC to SNF to address deficits while assisting pt to PLOF.    Recommendations for follow up therapy are one component of a multi-disciplinary discharge planning process, led by the attending physician.  Recommendations may be updated based on patient status, additional functional criteria and insurance authorization.  Follow Up Recommendations  Skilled nursing-short term rehab (<3 hours/day)     Assistance Recommended at Discharge Frequent or constant Supervision/Assistance  Patient can return home with the following A lot of help with walking and/or transfers;A lot of help with bathing/dressing/bathroom;Assistance with cooking/housework;Help with stairs or ramp for entrance;Assist for transportation   Equipment Recommendations  Wheelchair cushion (measurements  PT);Wheelchair (measurements PT) (pt reports having RW and BSC)       Precautions / Restrictions Precautions Precautions: Fall Precaution Comments: watch L knee buckling; L UE AV fistula Restrictions Weight Bearing Restrictions: No     Mobility  Bed Mobility  General bed mobility comments: pt was seated EOB pre/post session    Transfers Overall transfer level: Needs assistance Equipment used: Rolling walker (2 wheels) Transfers: Sit to/from Stand Sit to Stand: Min assist, Mod assist    General transfer comment: min-mod assist to stand form lowest bed height. Vcs for handplacement and fwd wt shift. increased time to perform. Poor eccentric lowering to sitting from standing    Ambulation/Gait Ambulation/Gait assistance: Min assist, Mod assist Gait Distance (Feet): 25 Feet Assistive device: Rolling walker (2 wheels) Gait Pattern/deviations: Step-to pattern, Trunk flexed Gait velocity: decreased     General Gait Details: pt started ambulation with CGA however she fatigued quickly and by the end required mod assist to prevent knee buckling and to safely achieve EOB sitting.    Balance Overall balance assessment: Needs assistance Sitting-balance support: Feet supported, No upper extremity supported Sitting balance-Leahy Scale: Good Sitting balance - Comments: pt was I'ly sitting EOB with family at bedside   Standing balance support: Bilateral upper extremity supported, Reliant on assistive device for balance Standing balance-Leahy Scale: Poor Standing balance comment: Once fatigued, pt required extensive assistance to prevent falling       Cognition Arousal/Alertness: Awake/alert Behavior During Therapy: WFL for tasks assessed/performed Overall Cognitive Status: Within Functional Limits for tasks assessed      General Comments: Pt is A and O x 4. Supportive spouse and daughter present.           General Comments General comments (skin  integrity, edema, etc.):  Reviewed importance of progressing OOB activity and performing ther ex even when in bed      Pertinent Vitals/Pain Pain Assessment Pain Assessment: No/denies pain Faces Pain Scale: No hurt Pain Intervention(s): Limited activity within patient's tolerance, Monitored during session     PT Goals (current goals can now be found in the care plan section) Acute Rehab PT Goals Patient Stated Goal: " rehab then home." Progress towards PT goals: Progressing toward goals    Frequency    Min 2X/week      PT Plan Current plan remains appropriate    Co-evaluation     PT goals addressed during session: Mobility/safety with mobility;Balance;Proper use of DME;Strengthening/ROM        AM-PAC PT "6 Clicks" Mobility   Outcome Measure  Help needed turning from your back to your side while in a flat bed without using bedrails?: A Little Help needed moving from lying on your back to sitting on the side of a flat bed without using bedrails?: A Little Help needed moving to and from a bed to a chair (including a wheelchair)?: A Little Help needed standing up from a chair using your arms (e.g., wheelchair or bedside chair)?: A Little Help needed to walk in hospital room?: A Lot Help needed climbing 3-5 steps with a railing? : Total 6 Click Score: 15    End of Session Equipment Utilized During Treatment: Gait belt Activity Tolerance: Patient tolerated treatment well Patient left: in chair;with call bell/phone within reach;with chair alarm set;with family/visitor present Nurse Communication: Mobility status;Precautions;Other (comment) (discontinued O2 and pt able to maintain > 92% throughout. RN made aware) PT Visit Diagnosis: Other abnormalities of gait and mobility (R26.89);Muscle weakness (generalized) (M62.81);Difficulty in walking, not elsewhere classified (R26.2)     Time: 3254-9826 PT Time Calculation (min) (ACUTE ONLY): 17 min  Charges:  $Gait Training: 8-22 mins                      Julaine Fusi PTA 03/31/22, 12:11 PM

## 2022-03-31 NOTE — TOC Progression Note (Signed)
Transition of Care [facility]) - Progression Note    Patient Details  Name: Cynthia Dean MRN: [id number] Date of Birth: 04/17/1951  Transition of Care [facility] CM/SW [place], LCSW Phone Number: 03/31/2022, 12:15 PM  Clinical Narrative:  Per MD, likely discharge tomorrow and will get IV abx at HD. [facility] confirmed they will have a bed and HD coordinator is aware of IV abx plan. Patient and family agree with plan. Started Ship broker.  Expected Discharge Plan: [place] Barriers to Discharge: Continued Medical Work up  Expected Discharge Plan and Services Expected Discharge Plan: [place] Choice: [place] arrangements for the past 2 months: Single Family Home                                       Social Determinants of Health (SDOH) Interventions    Readmission Risk Interventions    10/25/2019    8:43 AM 10/24/2019   10:14 AM 08/07/2019    3:25 PM  Readmission Risk Prevention Plan  Transportation Screening Complete  Complete  PCP or Specialist Appt within 3-5 Days Not Complete    HRI or Home Care Consult Complete Complete   Palliative Care Screening Not Applicable    Medication Review (RN Care Manager) Complete  Complete  HRI or [place]   Complete  [place]   Not Applicable

## 2022-03-31 NOTE — Progress Notes (Signed)
[facility]  ROUNDING NOTE   Subjective:   Cynthia Dean is a [age]-year-old female with past medical history including COPD, diabetes, carotid stenosis, hypertension, and end-stage renal disease on hemodialysis.  Patient presents to the emergency department with complaints of weakness, fatigue and fever.  Patient has been admitted for ESRD on hemodialysis ([place]) [N18.6, Z99.2] Acute pulmonary embolism without acute cor pulmonale ([place]) [I26.99] Sepsis with acute hypoxic respiratory failure and septic shock, due to unspecified organism ([place]) [A41.9, R65.21, J96.01]  Patient is known to our practice and receives outpatient dialysis treatments at [facility] on a TTS schedule, supervised by Dr. Candiss Norse.   Patient sitting up in bed, eating breakfast Family at bedside.  States she feels well, ready for discharge Remains on room air  Objective:  Vital signs in last 24 hours:  Temp:  [97.9 F (36.6 C)-99.1 F (37.3 C)] 98.3 F (36.8 C) (09/07 0756) Pulse Rate:  [64-76] 70 (09/07 0756) Resp:  [13-20] 16 (09/07 0756) BP: (113-166)/(56-72) 166/62 (09/07 0756) SpO2:  [89 %-100 %] 98 % (09/07 0756) Weight:  [113.4 kg-121.8 kg] 113.4 kg (09/07 0500)  Weight change:  Filed Weights   03/30/22 1822 03/30/22 1849 03/31/22 0500  Weight: 120.3 kg 120.3 kg 113.4 kg    Intake/Output: I/O last 3 completed shifts: In: 337.7 [P.O.:240; IV Piggyback:97.7] Out: 1500 [Other:1500]   Intake/Output this shift:  Total I/O In: 600 [P.O.:600] Out: -   Physical Exam: General: NAD, resting comfortably  Head: Normocephalic, atraumatic. Moist oral mucosal membranes  Eyes: Anicteric  Lungs:  Clear to auscultation, normal effort  Heart: Regular rate and rhythm  Abdomen:  Soft, tender in the lower abdominal area, obese  Extremities: trace peripheral edema.  Neurologic: Alert, oriented to person  Skin: No lesions  Access: Left aVG    Basic Metabolic Panel: Recent Labs   Lab 03/25/22 0523 03/30/22 0753 03/31/22 0329  NA 137 138 137  K 4.2 4.4 4.1  CL 101 101 99  CO2 '25 28 29  '$ GLUCOSE 119* 130* 170*  BUN 27* 22 15  CREATININE 8.43* 8.22* 6.01*  CALCIUM 9.4 9.9 9.0  PHOS  --  3.5  --      Liver Function Tests: Recent Labs  Lab 03/25/22 0523 03/30/22 0753  AST 14*  --   ALT 8  --   ALKPHOS 73  --   BILITOT 0.7  --   PROT 6.1*  --   ALBUMIN 2.6* 2.8*    No results for input(s): "LIPASE", "AMYLASE" in the last 168 hours. No results for input(s): "AMMONIA" in the last 168 hours.  CBC: Recent Labs  Lab 03/26/22 0714 03/27/22 0447 03/28/22 1000 03/29/22 1916 03/30/22 0753 03/31/22 0329  WBC 13.1* 11.9*  --  12.0* 11.3* 11.7*  NEUTROABS  --   --   --   --  7.1  --   HGB 8.3* 8.2* 7.7* 9.0* 8.6* 8.5*  HCT 26.6* 26.4* 25.2* 29.7* 27.9* 27.9*  MCV 87.5 88.3  --  89.7 88.6 88.6  PLT 267 277  --  255 264 230     Cardiac Enzymes: No results for input(s): "CKTOTAL", "CKMB", "CKMBINDEX", "TROPONINI" in the last 168 hours.  BNP: Invalid input(s): "POCBNP"  CBG: Recent Labs  Lab 03/30/22 1238 03/30/22 1904 03/30/22 2011 03/31/22 0759 03/31/22 1146  GLUCAP 141* 141* 122* 132* 172*     Microbiology: Results for orders placed or performed during the hospital encounter of 03/19/22  Resp Panel by  RT-PCR (Flu A&B, Covid) Anterior Nasal Swab     Status: None   Collection Time: 03/19/22  9:28 PM   Specimen: Anterior Nasal Swab  Result Value Ref Range Status   SARS Coronavirus 2 by RT PCR NEGATIVE NEGATIVE Final    Comment: (NOTE) SARS-CoV-2 target nucleic acids are NOT DETECTED.  The SARS-CoV-2 RNA is generally detectable in upper respiratory specimens during the acute phase of infection. The lowest concentration of SARS-CoV-2 viral copies this assay can detect is 138 copies/mL. A negative result does not preclude SARS-Cov-2 infection and should not be used as the sole basis for treatment or other patient management  decisions. A negative result may occur with  improper specimen collection/handling, submission of specimen other than nasopharyngeal swab, presence of viral mutation(s) within the areas targeted by this assay, and inadequate number of viral copies(<138 copies/mL). A negative result must be combined with clinical observations, patient history, and epidemiological information. The expected result is Negative.  Fact Sheet for Patients:  EntrepreneurPulse.com.au  Fact Sheet for Healthcare Providers:  IncredibleEmployment.be  This test is no t yet approved or cleared by the [place] FDA and  has been authorized for detection and/or diagnosis of SARS-CoV-2 by FDA under an Emergency Use Authorization (EUA). This EUA will remain  in effect (meaning this test can be used) for the duration of the COVID-19 declaration under Section 564(b)(1) of the Act, 21 U.S.C.section 360bbb-3(b)(1), unless the authorization is terminated  or revoked sooner.       Influenza A by PCR NEGATIVE NEGATIVE Final   Influenza B by PCR NEGATIVE NEGATIVE Final    Comment: (NOTE) The Xpert Xpress SARS-CoV-2/FLU/RSV plus assay is intended as an aid in the diagnosis of influenza from Nasopharyngeal swab specimens and should not be used as a sole basis for treatment. Nasal washings and aspirates are unacceptable for Xpert Xpress SARS-CoV-2/FLU/RSV testing.  Fact Sheet for Patients: EntrepreneurPulse.com.au  Fact Sheet for Healthcare Providers: IncredibleEmployment.be  This test is not yet approved or cleared by the [place] FDA and has been authorized for detection and/or diagnosis of SARS-CoV-2 by FDA under an Emergency Use Authorization (EUA). This EUA will remain in effect (meaning this test can be used) for the duration of the COVID-19 declaration under Section 564(b)(1) of the Act, 21 U.S.C. section 360bbb-3(b)(1), unless the  authorization is terminated or revoked.  Performed at [facility], [address] [place], [place] [address]   Blood Culture (routine x 2)     Status: Abnormal   Collection Time: 03/19/22  9:28 PM   Specimen: BLOOD  Result Value Ref Range Status   Specimen Description   Final    BLOOD RIGHT ANTECUBITAL Performed at [facility], [address], [place], [place] [address]    Special Requests   Final    BOTTLES DRAWN AEROBIC AND ANAEROBIC Blood Culture adequate volume Performed at [facility], [address], [place], [place] [address]    Culture  Setup Time   Final    GRAM POSITIVE COCCI IN BOTH AEROBIC AND ANAEROBIC BOTTLES CRITICAL RESULT CALLED TO, READ BACK BY AND VERIFIED WITH: CAROLYN CHILDS 03/20/22 1457 AMK Performed at [facility], [address] [place], [place] [address]    Culture STAPHYLOCOCCUS CAPITIS (A)  Final   Report Status 03/23/2022 FINAL  Final   Organism ID, Bacteria STAPHYLOCOCCUS CAPITIS  Final      Susceptibility   Staphylococcus capitis - MIC*    CIPROFLOXACIN <=0.5 SENSITIVE Sensitive  ERYTHROMYCIN <=0.25 SENSITIVE Sensitive     GENTAMICIN <=0.5 SENSITIVE Sensitive     OXACILLIN <=0.25 SENSITIVE Sensitive     TETRACYCLINE <=1 SENSITIVE Sensitive     VANCOMYCIN <=0.5 SENSITIVE Sensitive     TRIMETH/SULFA <=10 SENSITIVE Sensitive     CLINDAMYCIN <=0.25 SENSITIVE Sensitive     RIFAMPIN <=0.5 SENSITIVE Sensitive     Inducible Clindamycin NEGATIVE Sensitive     * STAPHYLOCOCCUS CAPITIS  Blood Culture ID Panel (Reflexed)     Status: Abnormal   Collection Time: 03/19/22  9:28 PM  Result Value Ref Range Status   Enterococcus faecalis NOT DETECTED NOT DETECTED Final   Enterococcus Faecium NOT DETECTED NOT DETECTED Final   Listeria monocytogenes NOT DETECTED NOT DETECTED Final   Staphylococcus species DETECTED (A) NOT DETECTED Final    Comment: CRITICAL RESULT CALLED TO, READ BACK BY AND VERIFIED  WITH: CAROLYN CHILDS 03/20/22 1457 AMK    Staphylococcus aureus (BCID) NOT DETECTED NOT DETECTED Final   Staphylococcus epidermidis NOT DETECTED NOT DETECTED Final   Staphylococcus lugdunensis NOT DETECTED NOT DETECTED Final   Streptococcus species NOT DETECTED NOT DETECTED Final   Streptococcus agalactiae NOT DETECTED NOT DETECTED Final   Streptococcus pneumoniae NOT DETECTED NOT DETECTED Final   Streptococcus pyogenes NOT DETECTED NOT DETECTED Final   A.calcoaceticus-baumannii NOT DETECTED NOT DETECTED Final   Bacteroides fragilis NOT DETECTED NOT DETECTED Final   Enterobacterales NOT DETECTED NOT DETECTED Final   Enterobacter cloacae complex NOT DETECTED NOT DETECTED Final   Escherichia coli NOT DETECTED NOT DETECTED Final   Klebsiella aerogenes NOT DETECTED NOT DETECTED Final   Klebsiella oxytoca NOT DETECTED NOT DETECTED Final   Klebsiella pneumoniae NOT DETECTED NOT DETECTED Final   Proteus species NOT DETECTED NOT DETECTED Final   Salmonella species NOT DETECTED NOT DETECTED Final   Serratia marcescens NOT DETECTED NOT DETECTED Final   Haemophilus influenzae NOT DETECTED NOT DETECTED Final   Neisseria meningitidis NOT DETECTED NOT DETECTED Final   Pseudomonas aeruginosa NOT DETECTED NOT DETECTED Final   Stenotrophomonas maltophilia NOT DETECTED NOT DETECTED Final   Candida albicans NOT DETECTED NOT DETECTED Final   Candida auris NOT DETECTED NOT DETECTED Final   Candida glabrata NOT DETECTED NOT DETECTED Final   Candida krusei NOT DETECTED NOT DETECTED Final   Candida parapsilosis NOT DETECTED NOT DETECTED Final   Candida tropicalis NOT DETECTED NOT DETECTED Final   Cryptococcus neoformans/gattii NOT DETECTED NOT DETECTED Final    Comment: Performed at [facility], [address] [place], [place] [address]  Blood Culture (routine x 2)     Status: Abnormal   Collection Time: 03/19/22 10:23 PM   Specimen: BLOOD  Result Value Ref Range Status   Specimen Description    Final    BLOOD BLOOD RIGHT FOREARM Performed at [facility], [address], [place], [place] [address]    Special Requests   Final    BOTTLES DRAWN AEROBIC AND ANAEROBIC Blood Culture adequate volume Performed at [facility], [address] [place], [place] [address]    Culture  Setup Time   Final    GRAM POSITIVE COCCI IN BOTH AEROBIC AND ANAEROBIC BOTTLES CRITICAL RESULT CALLED TO, READ BACK BY AND VERIFIED WITH: ELVERA MADUEME 03/20/22 1545 AMK Performed at [facility], [address] [place], [place] [address]    Culture (A)  Final    STAPHYLOCOCCUS CAPITIS SUSCEPTIBILITIES PERFORMED ON PREVIOUS CULTURE WITHIN THE LAST 5 DAYS. Performed at [facility],  [address] [place], [place] [address]    Report Status 03/22/2022 FINAL  Final  Urine Culture     Status: Abnormal   Collection Time: 03/20/22  1:25 AM   Specimen: In/Out Cath Urine  Result Value Ref Range Status   Specimen Description   Final    IN/OUT CATH URINE Performed at [facility], [address] [place], [place] [address]    Special Requests   Final    NONE Performed at [facility], [address] [place], [place] [address]    Culture 4,000 COLONIES/mL STAPHYLOCOCCUS EPIDERMIDIS (A)  Final   Report Status 03/22/2022 FINAL  Final   Organism ID, Bacteria STAPHYLOCOCCUS EPIDERMIDIS (A)  Final      Susceptibility   Staphylococcus epidermidis - MIC*    CIPROFLOXACIN <=0.5 SENSITIVE Sensitive     GENTAMICIN <=0.5 SENSITIVE Sensitive     NITROFURANTOIN <=16 SENSITIVE Sensitive     OXACILLIN <=0.25 SENSITIVE Sensitive     TETRACYCLINE <=1 SENSITIVE Sensitive     VANCOMYCIN 2 SENSITIVE Sensitive     TRIMETH/SULFA 80 RESISTANT Resistant     CLINDAMYCIN >=8 RESISTANT Resistant     RIFAMPIN <=0.5 SENSITIVE Sensitive     Inducible Clindamycin NEGATIVE Sensitive     * 4,000 COLONIES/mL STAPHYLOCOCCUS EPIDERMIDIS  Culture, blood (Routine X 2) w  Reflex to ID Panel     Status: None   Collection Time: 03/21/22 11:00 AM   Specimen: BLOOD  Result Value Ref Range Status   Specimen Description BLOOD BLOOD RIGHT FOREARM  Final   Special Requests   Final    BOTTLES DRAWN AEROBIC AND ANAEROBIC Blood Culture adequate volume   Culture   Final    NO GROWTH 5 DAYS Performed at [facility], [address] [place], [place] [address]    Report Status 03/26/2022 FINAL  Final  Culture, blood (Routine X 2) w Reflex to ID Panel     Status: None   Collection Time: 03/22/22 11:53 AM   Specimen: BLOOD RIGHT HAND  Result Value Ref Range Status   Specimen Description BLOOD RIGHT HAND  Final   Special Requests   Final    BOTTLES DRAWN AEROBIC AND ANAEROBIC Blood Culture adequate volume   Culture   Final    NO GROWTH 5 DAYS Performed at [facility], [address] [place], [place] [address]    Report Status 03/27/2022 FINAL  Final  Aerobic/Anaerobic Culture w Gram Stain (surgical/deep wound)     Status: None   Collection Time: 03/25/22 10:30 AM   Specimen: Wound; Blood  Result Value Ref Range Status   Specimen Description WOUND LEFT BREAST  Final   Special Requests BLOOD HEMATOMA  Final   Gram Stain   Final    RARE WBC PRESENT, PREDOMINANTLY MONONUCLEAR NO ORGANISMS SEEN    Culture   Final    No growth aerobically or anaerobically. Performed at [facility], [place] [address], [place], [place] [address]    Report Status 03/30/2022 FINAL  Final    Coagulation Studies: No results for input(s): "LABPROT", "INR" in the last 72 hours.   Urinalysis: No results for input(s): "COLORURINE", "LABSPEC", "PHURINE", "GLUCOSEU", "HGBUR", "BILIRUBINUR", "KETONESUR", "PROTEINUR", "UROBILINOGEN", "NITRITE", "LEUKOCYTESUR" in the last 72 hours.  Invalid input(s): "APPERANCEUR"     Imaging: No results found.   Medications:    sodium chloride 10 mL/hr at 03/30/22 0808    ceFAZolin (ANCEF) IV Stopped (03/30/22 2227)  apixaban  10 mg Oral BID   Followed by   Derrill Memo ON 04/03/2022] apixaban  5 mg Oral BID   atorvastatin  80 mg Oral QPM   calcium acetate  667 mg Oral TID WC   Chlorhexidine Gluconate Cloth  6 each Topical Q0600   vitamin D3  1,000 Units Oral Daily   cinacalcet  30 mg Oral Q breakfast   docusate sodium  200 mg Oral BID   epoetin (EPOGEN/PROCRIT) injection  10,000 Units Intravenous Q M,W,F-HD   fluticasone  1 spray Each Nare Daily   gabapentin  100 mg Oral BID   insulin aspart  0-6 Units Subcutaneous TID WC   insulin aspart protamine- aspart  10 Units Subcutaneous BID WC   levETIRAcetam  1,000 mg Oral Daily   levETIRAcetam  250 mg Oral Once per day on Mon Wed Fri   metoprolol succinate  50 mg Oral Q breakfast   pantoprazole  40 mg Oral Daily   polyethylene glycol  17 g Oral Daily   torsemide  40 mg Oral Once per day on Sun Tue Thu Sat   sodium chloride, acetaminophen **OR** acetaminophen, diphenoxylate-atropine, glucose, ipratropium-albuterol, lidocaine-prilocaine, magnesium hydroxide, metoCLOPramide (REGLAN) injection, oxyCODONE, traZODone  Assessment/ Plan:  Cynthia Dean is a 71 y.o.  female ast medical history including COPD, diabetes, carotid stenosis, hypertension, and end-stage renal disease on hemodialysis.  Patient presents to the emergency department with complaints of weakness, fatigue and fever.  Patient has been admitted for ESRD on hemodialysis ([place]) [N18.6, Z99.2] Acute pulmonary embolism without acute cor pulmonale ([place]) [I26.99] Sepsis with acute hypoxic respiratory failure and septic shock, due to unspecified organism ([place]) [A41.9, R65.21, J96.01]  [facility]/MWF/left aVF  End-stage renal disease on hemodialysis.  Dialysis received yesterday, UF 1.5L achieved. Next treatment scheduled for Friday.   2. Anemia of chronic kidney disease Lab Results  Component Value Date   HGB 8.5 (L) 03/31/2022    Patient receives [place] outpatient.   Received blood transfusion during this admission.  Continue EPO with treatments.   3. Secondary Hyperparathyroidism: with outpatient labs: PTH 613, phosphorus 4.8, calcium 9.0 on 03/07/2022.   Lab Results  Component Value Date   PTH 131 (H) 03/14/2018   CALCIUM 9.0 03/31/2022   PHOS 3.5 03/30/2022   Will continue to monitor bone minerals during this admission.   4. Diabetes mellitus type II with chronic kidney disease: insulin dependent. Home regimen includes Novolin. Most recent hemoglobin A1c is 7.5 on 01/29/20.  Primary team to manage sliding scale insulin.  5.  Hypertension with chronic kidney disease.  Home regimen includes losartan, metoprolol, and torsemide.     Receiving daily metoprolol and Torsemide on nondialysis days only. BP 166/62  6. Left breast cellulitis. Left breast US shows seroma vs abcess. Culture grew staph capitis. Being treated with cefazolin. ID recommending continued IV cefazolin with dialysis, 2g/2g/3g, until 04/03/22.   LOS: [place] 9/7/20231:02 PM

## 2022-03-31 NOTE — Care Management Important Message (Signed)
Important Message  Patient Details  Name: Cynthia Dean MRN: [id number] Date of Birth: 1951-01-29   Medicare Important Message Given:  Yes  Asleep upon time of visit.  Copy of Medicare IM left on windowsill in room for reference.   [name] 03/31/2022, 1:58 PM

## 2022-04-01 LAB — BASIC METABOLIC PANEL
Anion gap: 11 (ref 5–15)
BUN: 25 mg/dL — ABNORMAL HIGH (ref 8–23)
CO2: 28 mmol/L (ref 22–32)
Calcium: 9.3 mg/dL (ref 8.9–10.3)
Chloride: 101 mmol/L (ref 98–111)
Creatinine, Ser: 8.14 mg/dL — ABNORMAL HIGH (ref 0.44–1.00)
GFR, Estimated: 5 mL/min — ABNORMAL LOW (ref >60)
Glucose, Bld: 135 mg/dL — ABNORMAL HIGH (ref 70–99)
Potassium: 4.3 mmol/L (ref 3.5–5.1)
Sodium: 140 mmol/L (ref 135–145)

## 2022-04-01 LAB — GLUCOSE, CAPILLARY
Glucose-Capillary: 143 mg/dL — ABNORMAL HIGH (ref 70–99)
Glucose-Capillary: 114 mg/dL — ABNORMAL HIGH (ref 70–99)
Glucose-Capillary: 181 mg/dL — ABNORMAL HIGH (ref 70–99)

## 2022-04-01 LAB — CBC
HCT: 25.7 % — ABNORMAL LOW (ref 36.0–46.0)
Hemoglobin: 7.8 g/dL — ABNORMAL LOW (ref 12.0–15.0)
MCH: 27.1 pg (ref 26.0–34.0)
MCHC: 30.4 g/dL (ref 30.0–36.0)
MCV: 89.2 fL (ref 80.0–100.0)
Platelets: 200 10*3/uL (ref 150–400)
RBC: 2.88 MIL/uL — ABNORMAL LOW (ref 3.87–5.11)
RDW: 17.4 % — ABNORMAL HIGH (ref 11.5–15.5)
WBC: 10.7 10*3/uL — ABNORMAL HIGH (ref 4.0–10.5)
nRBC: 0 % (ref 0.0–0.2)

## 2022-04-01 MED ORDER — APIXABAN 5 MG PO TABS: 5.0000 mg | ORAL_TABLET | Freq: Two times a day (BID) | ORAL | 0 refills | Status: DC

## 2022-04-01 MED ORDER — CALCIUM ACETATE (PHOS BINDER) 667 MG PO CAPS: 667.0000 mg | ORAL_CAPSULE | Freq: Three times a day (TID) | ORAL | 0 refills | Status: AC

## 2022-04-01 MED ORDER — OXYCODONE HCL 5 MG PO TABS: 5.0000 mg | ORAL_TABLET | Freq: Four times a day (QID) | ORAL | 0 refills | Status: AC | PRN

## 2022-04-01 MED ORDER — TORSEMIDE 40 MG PO TABS: 40.0000 mg | ORAL_TABLET | Freq: Every day | ORAL | 0 refills | Status: DC

## 2022-04-01 MED ORDER — EPOETIN ALFA 10000 UNIT/ML IJ SOLN
INTRAMUSCULAR | Status: AC
  Filled 2022-04-01: qty 1

## 2022-04-01 MED ORDER — METOPROLOL SUCCINATE ER 50 MG PO TB24: 50.0000 mg | ORAL_TABLET | Freq: Every day | ORAL | 0 refills | Status: DC

## 2022-04-01 MED ORDER — APIXABAN 5 MG PO TABS: 10.0000 mg | ORAL_TABLET | Freq: Two times a day (BID) | ORAL | 0 refills | Status: DC

## 2022-04-01 NOTE — Progress Notes (Signed)
Patient placed on 4L oxygen due to desaturation to 70s during sleep. Upon application of oxygen, saturation increased to 91% on 4L. Will continue to monitor. Has history of COPD, respiratory failure with hypoxia, and PE in bilateral lungs.

## 2022-04-01 NOTE — TOC Transition Note (Signed)
Transition of Care [facility]) - CM/SW Discharge Note   Patient Details  Name: Cynthia Dean MRN: [id number] Date of Birth: 06-15-1951  [facility] CM/SW Contact:  Alberteen Sam, LCSW Phone Number: 04/01/2022, 1:13 PM   Clinical Narrative:     Patient will DC PY:PPJK Anticipated DC date: 04/01/22 Transport DT:[facility]  Per MD patient ready for DC to [facility] . RN, patient, patient's family, and facility notified of DC. Discharge Summary sent to facility. RN given number for report  [phone]. DC packet on chart. Ambulance transport requested for patient.  CSW signing off.  Pricilla Riffle, LCSW    Final next level of care: Skilled Nursing Facility Barriers to Discharge: No Barriers Identified   Patient Goals and CMS Choice Patient states their goals for this hospitalization and ongoing recovery are:: to go home CMS Medicare.gov Compare Post Acute Care list provided to:: Patient Choice offered to / list presented to : Patient  Discharge Placement              Patient chooses bed at: [facility] [place] Patient to be transferred to facility by: [facility]   Patient and family notified of of transfer: 04/01/22  Discharge Plan and Services     Post Acute Care Choice: [place]                               Social Determinants of Health (SDOH) Interventions     Readmission Risk Interventions    10/25/2019    8:43 AM 10/24/2019   10:14 AM 08/07/2019    3:25 PM  Readmission Risk Prevention Plan  Transportation Screening Complete  Complete  PCP or Specialist Appt within 3-5 Days Not Complete    HRI or Home Care Consult Complete Complete   Palliative Care Screening Not Applicable    Medication Review (RN Care Manager) Complete  Complete  [place] or [place]   Complete  [place]   Not Applicable

## 2022-04-01 NOTE — Progress Notes (Signed)
OT Cancellation Note  Patient Details Name: Cynthia Dean MRN: [id number] DOB: 1951/06/20   Cancelled Treatment:    Reason Eval/Treat Not Completed: Patient at procedure or test/ unavailable. Chart reviewed, pt currently off unit for HA, will continue to follow POC as able.   Dessie Coma, M.S. OTR/L  04/01/22, 9:31 AM  ascom [phone]

## 2022-04-01 NOTE — Progress Notes (Signed)
Patient transported by [facility] to [employer] via Biomedical scientist. Patient remains weak and unable to stand and pivot, so staff assisted with bed transfer to stretcher. All belongings packed and sent with patient. Significant other aware of transfer and destination, room, place, phone number, etc. Patient VS stable. No s/s of distress, SOB, CP. A&O x3, forgetful and repetitive at times. IV removed, report called to [facility], and belongings collected and packed by day nurse. Provided report and VS to EMS staff. No further questions.

## 2022-04-01 NOTE — Progress Notes (Signed)
Post HD RN assessment 

## 2022-04-01 NOTE — Discharge Summary (Signed)
Physician Discharge Summary  Cynthia Dean CVE:[id number] DOB: 1951/02/12 DOA: 03/19/2022  PCP: Lowella Bandy, MD  Admit date: 03/19/2022 Discharge date: 04/01/2022  Admitted From: home  Disposition:  SNF  Recommendations for Outpatient Follow-up:  Follow up with PCP in 1-2 weeks F/u w/ nephro in 1-2 weeks   Home Health: no  Equipment/Devices: 4L [place]  Discharge Condition: stable  CODE STATUS: full Diet recommendation: Heart Healthy / Carb Modified   Brief/Interim Summary: HPI was taken from Dr. Sidney Ace: Cynthia Dean is a 71 y.o. African-American female with medical history significant for COPD, carotid stenosis, type 2 diabetes mellitus, end-stage renal disease on hemodialysis on MWF, hypertension and dyslipidemia, as well as invasive ductal carcinoma of the left breast status post surgical excision at [facility] recently, who presented to the emergency room with acute onset of generalized weakness and fatigue for the last couple of days.  She missed hemodialysis session yesterday.  She was fairly confused in the ER.  She has been having dyspnea with mild cough and occasional wheezing.  She admitted to fever and chills.  No chest pain or palpitations.  No nausea or vomiting or diarrhea or abdominal pain.  No bleeding diathesis.   ED Course: Upon presentation to the emergency room temperature was 102.9 with BP 139/57 heart rate 50 with respiratory to 23  with pulse oximetry of 87% on room air and 98% on 4 L of O2 by nasal cannula.  Labs revealed leukocytosis 16.5 with neutrophilia and hemoglobin of 8.1 with hematocrit 26.1 with platelets of 214.  Influenza antigens and COVID-19 PCR came back negative.  Blood cultures were drawn. EKG as reviewed by me : EKG showed sinus rhythm with rate of 81 with T wave inversion laterally. Imaging: Portable chest x-ray showed increasing vascular congestion without pulmonary edema.  Chest CTA revealed small pulmonary emboli within both lower lobes  and the right upper lobe with no evidence for right heart strain.  It showed cardiomegaly and coronary artery disease with bibasal atelectasis and large masslike area in the left breast with surrounding soft tissue gas that corresponds with recent left lumpectomy.   The patient was given IV cefepime and heparin bolus and drip as well as 500 mL IV normal saline.  She will be admitted to a progressive unit bed for further evaluation and management.  As per Dr. Jimmye Norman 8/27-8/29/23: Pt presented w/ shortness of breath and was found to have acute pulmonary embolism w/o right heart strain. Pt was initially put on IV heparin drip and then changed to po eliquis. Echo is pending. Also, pt was admitted for mild breast cellulitis and has been on IV rocephin. Urine cx was growing staph epidermis and blood cxs growing stap captis for which pt has been on IV doxycycline. The blood cxs likely represent a containment. Repeat blood cxs NGTD. PT recs SNF.   As per Dr. Kurtis Bushman: 8/31 LT breast US done.  9/1 s/p IR aspiration of Lt breast fluid collection.  Wbc was going up , but started to improve. Surgery did not do any I/D. Transitioned from heparin drip to Eliquis.  Had dialysis yesterday and in the evening blood pressure dropped to 62/36.  She was given albumin in normal sinus and 1 dose of 5 mg midodrine.  Now better. Made changes to her bp meds. Nephrology following. She was transfused x2 prbc due to low h/h. No reports of bleed. PT rec. SNF.    As per Dr. Jimmye Norman 9/6-04/01/22: Pt completed IV cefazolin  today as per ID. Of note, pt was 1 day more of eliquis '10mg'$  BID and then pt will start eliquis '5mg'$  BID. Pt was medically stable for d/c to SNF. For more information, please see previous progress/consult notes.       Discharge Diagnoses:  Principal Problem:   Acute pulmonary embolism without acute cor pulmonale (HCC) Active Problems:   Sepsis due to cellulitis (HCC)   ESRD on hemodialysis ([place])   Essential  hypertension   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[place])   Dyslipidemia   Diabetic neuropathy (HCC)   Urinary tract infection   Anemia of chronic disease   Left breast abscess   Seroma of breast   Bacteremia due to coagulase-negative Staphylococcus  Acute pulmonary embolism: without acute cor pulmonale. D/c IV heparin. Continue on eliquis and aspirin. Pt was not taking plavix as per Dr. Lupita Dawn noyr as per pharmacy (noted on 03/29/22).    ACD: likely secondary to ESRD.  W/ possible component of acute blood loss anemia. S/p 2 unit of pRBCs transfused so far. Will continue to monitor H&H   Sepsis: likely secondary to left breast cellulitis. Resolved   Mild cellulitis of left breast: completed IV cefazolin today. S/p IR aspiration of hematoma on 03/25/22. Cx shows NGTD   Staph capitis bacteremia: likely from breast as per ID. Completed IV cefazolin course today. Repeat blood cxs NGTD   Possible UTI: urine cx staph epidermidis. Completed abx course     ESRD: on HD MWF. Continue on torsemide. Nephro following and recs apprec   Constipation: continue on miralax, colace   Breast cancer: recent dx. S/p surgical excision at [facility] & chemo. High risk of blood clots w/ hx of cancer    Diabetic neuropathy: continue on home dose of gabapentin   HLD: continue on statin   DM2: HbA1c 5.8, well controlled. Continue on home anti-DM meds at d/c    HTN: continue on reduced dose of metoprolol, torsemide   Obesity: BMI 36.9. Complicates overall care & prognosis    Discharge Instructions  Discharge Instructions     Diet - low sodium heart healthy   Complete by: As directed    Diet Carb Modified   Complete by: As directed    Discharge instructions   Complete by: As directed    F/u w/ PCP in 1-2 weeks. F/u w/ nephro in 1-2 weeks. Continue on IV cefazolin w/ HD until 04/03/22 as per ID   Increase activity slowly   Complete by: As directed    No wound care   Complete by: As  directed       Allergies as of 04/01/2022       Reactions   Oxycodone Nausea And Vomiting   Oxycodone-acetaminophen Other (See Comments)   Wound Dressing Adhesive    Hydrocodone    Intolerant more than allergic   Tape Itching   Skin Dermatitis/itching (tape adhesive) Skin Dermatitis/itching (tape adhesive)   Tapentadol Itching   Skin Dermatitis/itching (tape adhesive) Skin Dermatitis/itching (tape adhesive)        Medication List     STOP taking these medications    clopidogrel 75 MG tablet Commonly known as: PLAVIX       TAKE these medications    acetaminophen 500 MG tablet Commonly known as: TYLENOL Take 500 mg by mouth every 6 (six) hours as needed.   apixaban 5 MG Tabs tablet Commonly known as: ELIQUIS Take 2 tablets (10 mg total) by mouth 2 (two) times  daily for 1 day.   apixaban 5 MG Tabs tablet Commonly known as: ELIQUIS Take 1 tablet (5 mg total) by mouth 2 (two) times daily. Only start this script after you have completed eliquis 10 mg BID x 1 day. Start taking on: April 03, 2022   aspirin EC 81 MG tablet Take 81 mg by mouth daily. Swallow whole.   atorvastatin 80 MG tablet Commonly known as: LIPITOR Take 80 mg by mouth every evening.   calcium acetate 667 MG capsule Commonly known as: PHOSLO Take 1 capsule (667 mg total) by mouth 3 (three) times daily with meals. What changed: how much to take   cinacalcet 30 MG tablet Commonly known as: SENSIPAR Take 30 mg by mouth daily at 6 (six) AM. Take with largest meal   diphenoxylate-atropine 2.5-0.025 MG tablet Commonly known as: LOMOTIL SMARTSIG:1 Tablet(s) By Mouth Every 12 Hours PRN   fluticasone 50 MCG/ACT nasal spray Commonly known as: FLONASE Place 1 spray into both nostrils daily.   gabapentin 100 MG capsule Commonly known as: Neurontin Take 1 capsule (100 mg total) by mouth at bedtime. What changed: when to take this   glucose 4 GM chewable tablet Chew 1 tablet by mouth as  needed for low blood sugar.   Ipratropium-Albuterol 20-100 MCG/ACT Aers respimat Commonly known as: COMBIVENT Inhale 1 puff into the lungs every 6 (six) hours. What changed: when to take this   levETIRAcetam 1000 MG tablet Commonly known as: KEPPRA Take 1 tablet (1,000 mg total) by mouth daily.   levETIRAcetam 250 MG tablet Commonly known as: KEPPRA Take 250 mg by mouth 3 (three) times a week.   lidocaine-prilocaine cream Commonly known as: EMLA Apply 1 application topically as needed (for port access).   loperamide 2 MG capsule Commonly known as: IMODIUM Take 2 mg by mouth 4 (four) times daily as needed.   losartan 100 MG tablet Commonly known as: COZAAR Take 100 mg by mouth at bedtime.   metoprolol succinate 50 MG 24 hr tablet Commonly known as: TOPROL-XL Take 1 tablet (50 mg total) by mouth daily with breakfast. Take with or immediately following a meal. Start taking on: April 02, 2022 What changed:  medication strength how much to take when to take this   NovoLIN 70/30 (70-30) 100 UNIT/ML injection Generic drug: insulin NPH-regular Human SMARTSIG:28 Unit(s) SUB-Q Twice Daily   omeprazole 20 MG capsule Commonly known as: PRILOSEC Take 20 mg by mouth 2 (two) times daily.   oxyCODONE 5 MG immediate release tablet Commonly known as: Oxy IR/ROXICODONE Take 1 tablet (5 mg total) by mouth every 6 (six) hours as needed for up to 1 day for moderate pain or severe pain. What changed: reasons to take this   Torsemide 40 MG Tabs Take 40 mg by mouth daily. What changed:  medication strength how much to take   vitamin D3 25 MCG tablet Commonly known as: CHOLECALCIFEROL Take 1 tablet (1,000 Units total) by mouth daily.        Contact information for after-discharge care     Destination     [facility] Preferred SNF .   Service: Skilled Nursing Contact information: [place] [id number] [phone]                     Allergies  Allergen Reactions   Oxycodone Nausea And Vomiting   Oxycodone-Acetaminophen Other (See Comments)   Wound Dressing Adhesive    Hydrocodone  Intolerant more than allergic   Tape Itching    Skin Dermatitis/itching (tape adhesive) Skin Dermatitis/itching (tape adhesive)   Tapentadol Itching    Skin Dermatitis/itching (tape adhesive) Skin Dermatitis/itching (tape adhesive)     Consultations: Nephro  ID General surg    Procedures/Studies: IR US Guide Bx Asp/Drain  Result Date: 03/25/2022 INDICATION: 71 year old female with complex left breast fluid collection status post surgical intervention. She presents for aspiration and possible drain placement. EXAM: Ultrasound-guided aspiration MEDICATIONS: None ANESTHESIA/SEDATION: None COMPLICATIONS: None immediate. PROCEDURE: Informed written consent was obtained from the patient after a thorough discussion of the procedural risks, benefits and alternatives. All questions were addressed. Maximal Sterile Barrier Technique was utilized including caps, mask, sterile gowns, sterile gloves, sterile drape, hand hygiene and skin antiseptic. A timeout was performed prior to the initiation of the procedure. The left breast was interrogated with ultrasound. There is a large amorphous lace-like complex fluid collection subjacent to the surgical incision site. Sonographically, the collection appears most consistent with hematoma. A suitable skin entry site was selected and marked. Local anesthesia was attained by infiltration with 1% lidocaine. A small dermatotomy was made. Under real-time ultrasound guidance, a 15 gauge trocar needle was advanced into the fluid collection. Aspiration was then performed yielding approximately 135 mL of old blood. The majority of the liquid component of the collection was successfully aspirated. The remaining collection is too thick/gelatinous for simple aspiration. Samples were sent for Gram stain  and culture to exclude superinfection of the hematoma. The patient tolerated the procedure well. IMPRESSION: Successful partial aspiration of large left breast hematoma. The 135 mL liquid component of the hematoma was successfully aspirated. A sample was sent for Gram stain and culture. Electronically Signed   By: Jacqulynn Cadet M.D.   On: 03/25/2022 15:08   CT CHEST W CONTRAST  Result Date: 03/24/2022 CLINICAL DATA:  Breast cancer, status post recent left lumpectomy, cellulitis and seroma, rule out necrotizing infection * Tracking Code: BO * EXAM: CT CHEST WITH CONTRAST TECHNIQUE: Multidetector CT imaging of the chest was performed during intravenous contrast administration. RADIATION DOSE REDUCTION: This exam was performed according to the departmental dose-optimization program which includes automated exposure control, adjustment of the mA and/or kV according to patient size and/or use of iterative reconstruction technique. CONTRAST:  16m OMNIPAQUE IOHEXOL 300 MG/ML  SOLN COMPARISON:  CT chest angiogram, 03/19/2022, CT chest, 12/18/2013 FINDINGS: Cardiovascular: Right chest port catheter. Aortic atherosclerosis. Cardiomegaly. Three-vessel coronary artery calcifications. No pericardial effusion. Mediastinum/Nodes: Numerous prominent mediastinal and bilateral hilar lymph nodes unchanged compared to recent prior examination and very similar to remote prior examination dated 12/18/2013. Largest pretracheal node measures 2.3 x 1.7 cm (series 7, image 56). Thyroid gland, trachea, and esophagus demonstrate no significant findings. Lungs/Pleura: Innumerable small pulmonary nodules throughout the lungs, not changed from recent prior examination, although the great majority of which appear new compared to more remote prior examination dated 12/18/2013. Example nodule of the right apex measures 0.6 cm (series 3, image 32). Additional index nodule of the anterior left upper lobe measures 0.7 cm (series 3, image 48).  Diffuse bilateral bronchial wall thickening and extensive interlobular septal thickening throughout, including non dependent portions of the lungs. Generally bandlike scarring and or atelectasis of the bilateral lung bases. No pleural effusion or pneumothorax. Upper Abdomen: No acute abnormality. Small, benign left adrenal myelolipoma which no further specific follow-up or characterization required (series 2, image 138). Musculoskeletal: Large, partially imaged fluid collection within the left breast containing scattered internal air  loculations with extensive adjacent fat stranding and overlying soft tissue thickening, although incompletely imaged due to large body habitus, measures at least 8.5 cm (series 7, image 92). Additional adjacent collection within the left axilla is fully imaged and measures 11.4 x 5.8 cm (series 7, image 42). No acute osseous findings. IMPRESSION: 1. Large, partially imaged fluid collection within the left breast containing scattered internal air loculations with extensive adjacent fat stranding and overlying soft tissue thickening, although incompletely imaged due to large body habitus, measures at least 8.5 cm. Persistent presence of air within this fluid collection is worrisome for gas-forming infection. 2. Additional adjacent collection within the left axilla is fully imaged and measures 11.4 x 5.8 cm without evidence of gas content and of simple fluid attenuation, most consistent with seroma. The presence or absence of infection within either of these fluid collections is not strictly established by CT. 3. Innumerable small pulmonary nodules throughout the lungs, not changed from recent prior examination, although the great majority of which appear new compared to more remote prior examination dated 12/18/2013. These are worrisome for pulmonary metastatic disease in the setting of breast cancer although potentially infectious or inflammatory. 4. Diffuse bilateral bronchial wall  thickening and interlobular septal thickening throughout, possibly reflecting pulmonary edema, although interlobular septal thickening is worrisome as a potential feature of lymphangitic metastatic disease in the setting of breast cancer. 5. Numerous prominent mediastinal and bilateral hilar lymph nodes unchanged compared to recent prior examination and very similar to remote prior examination dated 12/18/2013, most likely benign and reactive. 6. Cardiomegaly and coronary artery disease. Aortic Atherosclerosis (ICD10-I70.0). Electronically Signed   By: Delanna Ahmadi M.D.   On: 03/24/2022 16:48   US BREAST LTD UNI LEFT INC AXILLA  Result Date: 03/24/2022 CLINICAL DATA:  Mass of left breast, assess for abscess. Patient is status post left breast lumpectomy August 11th 2023. EXAM: ULTRASOUND OF THE LEFT BREAST COMPARISON:  Prior films FINDINGS: Targeted ultrasound is performed, showing complicated cystic lesion at the palpable area left breast 1-3 o'clock 12 cm from nipple measuring 14.5 x 6.3 x 9.2 cm. This may represent postsurgical seroma but abscess is not excluded based on ultrasound. IMPRESSION: Benign findings. Large mass at the left breast 1-3 o'clock 12 cm from nipple. This may represent postsurgical seroma but abscess is not excluded based on ultrasound. RECOMMENDATION: Management on clinical basis. I have discussed the findings and recommendations with the patient. If applicable, a reminder letter will be sent to the patient regarding the next appointment. BI-RADS CATEGORY  2: Benign. Electronically Signed   By: Abelardo Diesel M.D.   On: 03/24/2022 10:15   ECHOCARDIOGRAM COMPLETE  Result Date: 03/23/2022    ECHOCARDIOGRAM REPORT   Patient Name:   NAYLA DIAS Date of Exam: 03/22/2022 Medical Rec #:  [id number]              Height:       69.0 in Accession #:    [id number]             Weight:       256.4 lb Date of Birth:  25-Apr-1951               BSA:          2.296 m Patient Age:    4 years                BP:           131/80 mmHg Patient Gender: F  HR:           67 bpm. Exam Location:  [facility] Procedure: 2D Echo, Cardiac Doppler and Color Doppler Indications:     Pulmonary Embolus I26.09  History:         Patient has prior history of Echocardiogram examinations, most                  recent 01/01/2019. COPD, Signs/Symptoms:Murmur; Risk                  Factors:Diabetes and Hypertension.  Sonographer:     Sherrie Sport Referring Phys:  [id number] JAN A MANSY Diagnosing Phys: Yolonda Kida MD  Sonographer Comments: Suboptimal apical window. IMPRESSIONS  1. TDS.  2. Left ventricular ejection fraction, by estimation, is 50 to 55%. The left ventricle has normal function. The left ventricle has no regional wall motion abnormalities. The left ventricular internal cavity size was mildly dilated. Left ventricular diastolic parameters are consistent with Grade II diastolic dysfunction (pseudonormalization).  3. Right ventricular systolic function is normal. The right ventricular size is normal.  4. Left atrial size was mildly dilated.  5. Right atrial size was mildly dilated.  6. The mitral valve is normal in structure. Trivial mitral valve regurgitation.  7. The aortic valve is normal in structure. Aortic valve regurgitation is not visualized. FINDINGS  Left Ventricle: Left ventricular ejection fraction, by estimation, is 50 to 55%. The left ventricle has normal function. The left ventricle has no regional wall motion abnormalities. The left ventricular internal cavity size was mildly dilated. There is  no left ventricular hypertrophy. Left ventricular diastolic parameters are consistent with Grade II diastolic dysfunction (pseudonormalization). Right Ventricle: The right ventricular size is normal. No increase in right ventricular wall thickness. Right ventricular systolic function is normal. Left Atrium: Left atrial size was mildly dilated. Right Atrium: Right atrial size was mildly dilated.  Pericardium: There is no evidence of pericardial effusion. Mitral Valve: The mitral valve is normal in structure. Trivial mitral valve regurgitation. Tricuspid Valve: The tricuspid valve is normal in structure. Tricuspid valve regurgitation is mild. Aortic Valve: The aortic valve is normal in structure. Aortic valve regurgitation is not visualized. Aortic valve mean gradient measures 4.7 mmHg. Aortic valve peak gradient measures 9.3 mmHg. Aortic valve area, by VTI measures 1.79 cm. Pulmonic Valve: The pulmonic valve was normal in structure. Pulmonic valve regurgitation is not visualized. Aorta: The ascending aorta was not well visualized. IAS/Shunts: No atrial level shunt detected by color flow Doppler. Additional Comments: TDS.  LEFT VENTRICLE PLAX 2D LVIDd:         5.00 cm   Diastology LVIDs:         3.60 cm   LV e' medial:    5.77 cm/s LV PW:         1.10 cm   LV E/e' medial:  24.1 LV IVS:        1.10 cm   LV e' lateral:   6.96 cm/s LVOT diam:     2.00 cm   LV E/e' lateral: 20.0 LV SV:         56 LV SV Index:   24 LVOT Area:     3.14 cm  RIGHT VENTRICLE RV Basal diam:  4.20 cm RV S prime:     13.90 cm/s TAPSE (M-mode): 3.2 cm LEFT ATRIUM              Index        RIGHT ATRIUM  Index LA diam:        4.40 cm  1.92 cm/m   RA Area:     19.70 cm LA Vol (A2C):   117.0 ml 50.96 ml/m  RA Volume:   57.10 ml  24.87 ml/m LA Vol (A4C):   68.9 ml  30.01 ml/m LA Biplane Vol: 97.9 ml  42.64 ml/m  AORTIC VALVE AV Area (Vmax):    1.58 cm AV Area (Vmean):   1.73 cm AV Area (VTI):     1.79 cm AV Vmax:           152.67 cm/s AV Vmean:          98.300 cm/s AV VTI:            0.311 m AV Peak Grad:      9.3 mmHg AV Mean Grad:      4.7 mmHg LVOT Vmax:         77.00 cm/s LVOT Vmean:        54.200 cm/s LVOT VTI:          0.177 m LVOT/AV VTI ratio: 0.57  AORTA Ao Root diam: 2.47 cm MITRAL VALVE                TRICUSPID VALVE MV Area (PHT): 2.63 cm     TR Peak grad:   35.3 mmHg MV Decel Time: 288 msec     TR Vmax:         297.00 cm/s MV E velocity: 139.00 cm/s MV A velocity: 92.50 cm/s   SHUNTS MV E/A ratio:  1.50         Systemic VTI:  0.18 m                             Systemic Diam: 2.00 cm Yolonda Kida MD Electronically signed by Yolonda Kida MD Signature Date/Time: 03/23/2022/8:00:35 AM    Final    US Venous Img Lower Unilateral Left (DVT)  Result Date: 03/20/2022 CLINICAL DATA:  71 year old female with history of pulmonary embolism. EXAM: LEFT LOWER EXTREMITY VENOUS DOPPLER ULTRASOUND TECHNIQUE: Gray-scale sonography with compression, as well as color and duplex ultrasound, were performed to evaluate the deep venous system(s) from the level of the common femoral vein through the popliteal and proximal calf veins. COMPARISON:  No priors. FINDINGS: VENOUS Normal compressibility of the common femoral, superficial femoral, and popliteal veins, as well as the visualized calf veins. Visualized portions of profunda femoral vein and great saphenous vein unremarkable. No filling defects to suggest DVT on grayscale or color Doppler imaging. Doppler waveforms show normal direction of venous flow, normal respiratory plasticity and response to augmentation. Limited views of the contralateral common femoral vein are unremarkable. OTHER None. Limitations: none IMPRESSION: Negative. Electronically Signed   By: Vinnie Langton M.D.   On: 03/20/2022 05:04   US Venous Img Lower Unilateral Right  Result Date: 03/20/2022 CLINICAL DATA:  Leg swelling and calf pain EXAM: RIGHT LOWER EXTREMITY VENOUS DOPPLER ULTRASOUND TECHNIQUE: Gray-scale sonography with compression, as well as color and duplex ultrasound, were performed to evaluate the deep venous system(s) from the level of the common femoral vein through the popliteal and proximal calf veins. COMPARISON:  None Available. FINDINGS: VENOUS Normal compressibility of the common femoral, superficial femoral, and popliteal veins, as well as the visualized calf veins. Visualized  portions of profunda femoral vein and great saphenous vein unremarkable. No filling defects to suggest DVT on grayscale  or color Doppler imaging. Doppler waveforms show normal direction of venous flow, normal respiratory plasticity and response to augmentation. Limited views of the contralateral common femoral vein are unremarkable. OTHER None. Limitations: none IMPRESSION: Negative. Electronically Signed   By: Julian Hy M.D.   On: 03/20/2022 00:19   CT Angio Chest PE W and/or Wo Contrast  Result Date: 03/20/2022 CLINICAL DATA:  Pulmonary embolism (PE) suspected, high prob EXAM: CT ANGIOGRAPHY CHEST WITH CONTRAST TECHNIQUE: Multidetector CT imaging of the chest was performed using the standard protocol during bolus administration of intravenous contrast. Multiplanar CT image reconstructions and MIPs were obtained to evaluate the vascular anatomy. RADIATION DOSE REDUCTION: This exam was performed according to the departmental dose-optimization program which includes automated exposure control, adjustment of the mA and/or kV according to patient size and/or use of iterative reconstruction technique. CONTRAST:  1101m OMNIPAQUE IOHEXOL 350 MG/ML SOLN COMPARISON:  12/18/2013 FINDINGS: Cardiovascular: Filling defects within posterior right lower lobe pulmonary arterial branches compatible with pulmonary emboli. Small pulmonary emboli also seen in the left lower lobe and right upper lobe. Mild cardiomegaly. Diffuse coronary artery disease. Aortic atherosclerosis. No aneurysm. Mediastinum/Nodes: No mediastinal, hilar, or axillary adenopathy. Trachea and esophagus are unremarkable. Thyroid unremarkable. Lungs/Pleura: Bibasilar opacities, likely atelectasis. No effusions. Upper Abdomen: No acute findings Musculoskeletal: Large soft tissue mass partially visualized in the left breast with gas throughout the adjacent soft tissues. Question recent surgical procedure. No acute bony abnormality. Review of the MIP  images confirms the above findings. IMPRESSION: Small pulmonary emboli within both lower lobes and the right upper lobe. No evidence of right heart strain. Cardiomegaly, coronary artery disease. Bibasilar atelectasis. Large masslike area in the left breast with surrounding soft tissue gas. Question recent surgery in the left breast. Recommend correlation with history and mammography. Aortic Atherosclerosis (ICD10-I70.0). Electronically Signed   By: KRolm BaptiseM.D.   On: 03/20/2022 00:05   DG Chest Port 1 View  Result Date: 03/19/2022 CLINICAL DATA:  Possible sepsis EXAM: PORTABLE CHEST 1 VIEW COMPARISON:  01/26/2022 FINDINGS: Cardiac shadow is enlarged in size but stable. Aortic calcifications are seen. Loop recorder and right chest wall port are again seen. Increasing central vascular congestion is noted without significant edema. No focal infiltrate or effusion is noted. No bony abnormality is seen. IMPRESSION: Increasing vascular congestion without significant edema. Electronically Signed   By: MInez CatalinaM.D.   On: 03/19/2022 22:34   (Echo, Carotid, EGD, Colonoscopy, ERCP)    Subjective:   Discharge Exam: Vitals:   04/01/22 1123 04/01/22 1128  BP: (!) 166/128 (!) 151/68  Pulse: 73 72  Resp:    Temp: 97.8 F (36.6 C)   SpO2: 100% 100%   Vitals:   04/01/22 1030 04/01/22 1100 04/01/22 1123 04/01/22 1128  BP: (!) 168/75  (!) 166/128 (!) 151/68  Pulse: 72 72 73 72  Resp:      Temp:   97.8 F (36.6 C)   TempSrc:   Oral   SpO2: 100% 100% 100% 100%  Weight:      Height:        General: Pt is alert, awake, not in acute distress Cardiovascular: S1/S2 +, no rubs, no gallops Respiratory: decreased breath sounds b/l otherwise clear  Abdominal: Soft, NT, obese, bowel sounds + Extremities: no cyanosis    The results of significant diagnostics from this hospitalization (including imaging, microbiology, ancillary and laboratory) are listed below for reference.      Microbiology: Recent Results (from the past 240 hour(s))  Aerobic/Anaerobic Culture w Gram Stain (surgical/deep wound)     Status: None   Collection Time: 03/25/22 10:30 AM   Specimen: Wound; Blood  Result Value Ref Range Status   Specimen Description WOUND LEFT BREAST  Final   Special Requests BLOOD HEMATOMA  Final   Gram Stain   Final    RARE WBC PRESENT, PREDOMINANTLY MONONUCLEAR NO ORGANISMS SEEN    Culture   Final    No growth aerobically or anaerobically. Performed at [facility], [place] [address], [place], [place] [address]    Report Status 03/30/2022 FINAL  Final     Labs: BNP (last 3 results) No results for input(s): "BNP" in the last 8760 hours. Basic Metabolic Panel: Recent Labs  Lab 03/30/22 0753 03/31/22 0329 04/01/22 0537  NA 138 137 140  K 4.4 4.1 4.3  CL 101 99 101  CO2 '28 29 28  '$ GLUCOSE 130* 170* 135*  BUN 22 15 25*  CREATININE 8.22* 6.01* 8.14*  CALCIUM 9.9 9.0 9.3  PHOS 3.5  --   --    Liver Function Tests: Recent Labs  Lab 03/30/22 0753  ALBUMIN 2.8*   No results for input(s): "LIPASE", "AMYLASE" in the last 168 hours. No results for input(s): "AMMONIA" in the last 168 hours. CBC: Recent Labs  Lab 03/27/22 0447 03/28/22 1000 03/29/22 1916 03/30/22 0753 03/31/22 0329 04/01/22 0537  WBC 11.9*  --  12.0* 11.3* 11.7* 10.7*  NEUTROABS  --   --   --  7.1  --   --   HGB 8.2* 7.7* 9.0* 8.6* 8.5* 7.8*  HCT 26.4* 25.2* 29.7* 27.9* 27.9* 25.7*  MCV 88.3  --  89.7 88.6 88.6 89.2  PLT 277  --  255 264 230 200   Cardiac Enzymes: No results for input(s): "CKTOTAL", "CKMB", "CKMBINDEX", "TROPONINI" in the last 168 hours. BNP: Invalid input(s): "POCBNP" CBG: Recent Labs  Lab 03/31/22 1146 03/31/22 1700 03/31/22 2021 04/01/22 0725 04/01/22 1223  GLUCAP 172* 121* 132* 143* 114*   D-Dimer No results for input(s): "DDIMER" in the last 72 hours. Hgb A1c No results for input(s): "HGBA1C" in the last 72 hours. Lipid Profile No  results for input(s): "CHOL", "HDL", "LDLCALC", "TRIG", "CHOLHDL", "LDLDIRECT" in the last 72 hours. Thyroid function studies No results for input(s): "TSH", "T4TOTAL", "T3FREE", "THYROIDAB" in the last 72 hours.  Invalid input(s): "FREET3" Anemia work up No results for input(s): "VITAMINB12", "FOLATE", "FERRITIN", "TIBC", "IRON", "RETICCTPCT" in the last 72 hours. Urinalysis    Component Value Date/Time   COLORURINE YELLOW (A) 03/20/2022 0125   APPEARANCEUR CLEAR (A) 03/20/2022 0125   APPEARANCEUR Clear 06/14/2014 1510   LABSPEC 1.010 03/20/2022 0125   LABSPEC 1.006 06/14/2014 1510   PHURINE 7.0 03/20/2022 0125   GLUCOSEU 50 (A) 03/20/2022 0125   GLUCOSEU >=500 06/14/2014 1510   HGBUR MODERATE (A) 03/20/2022 0125   BILIRUBINUR NEGATIVE 03/20/2022 0125   BILIRUBINUR Negative 06/14/2014 1510   KETONESUR NEGATIVE 03/20/2022 0125   PROTEINUR 100 (A) 03/20/2022 0125   NITRITE NEGATIVE 03/20/2022 0125   LEUKOCYTESUR NEGATIVE 03/20/2022 0125   LEUKOCYTESUR Negative 06/14/2014 1510   Sepsis Labs Recent Labs  Lab 03/29/22 1916 03/30/22 0753 03/31/22 0329 04/01/22 0537  WBC 12.0* 11.3* 11.7* 10.7*   Microbiology Recent Results (from the past 240 hour(s))  Aerobic/Anaerobic Culture w Gram Stain (surgical/deep wound)     Status: None   Collection Time: 03/25/22 10:30 AM   Specimen: Wound; Blood  Result Value Ref Range Status  Specimen Description WOUND LEFT BREAST  Final   Special Requests BLOOD HEMATOMA  Final   Gram Stain   Final    RARE WBC PRESENT, PREDOMINANTLY MONONUCLEAR NO ORGANISMS SEEN    Culture   Final    No growth aerobically or anaerobically. Performed at [facility], [place] [address], [place], [place] [address]    Report Status 03/30/2022 FINAL  Final     Time coordinating discharge: Over 30 minutes  SIGNED:   Wyvonnia Dusky, MD  [facility] 04/01/2022, 12:53 PM Pager   If 7PM-7AM, please contact night-coverage [email or web address]

## 2022-04-01 NOTE — Progress Notes (Signed)
[facility]  ROUNDING NOTE   Subjective:   Lyzbeth Hansika Leaming is a [age]-year-old female with past medical history including COPD, diabetes, carotid stenosis, hypertension, and end-stage renal disease on hemodialysis.  Patient presents to the emergency department with complaints of weakness, fatigue and fever.  Patient has been admitted for ESRD on hemodialysis ([place]) [N18.6, Z99.2] Acute pulmonary embolism without acute cor pulmonale ([place]) [I26.99] Sepsis with acute hypoxic respiratory failure and septic shock, due to unspecified organism ([place]) [A41.9, R65.21, J96.01]  Patient is known to our practice and receives outpatient dialysis treatments at [facility] on a TTS schedule, supervised by Dr. Candiss Norse.   Patient seen and evaluated during dialysis   HEMODIALYSIS FLOWSHEET:  Blood Flow Rate (mL/min): 400 mL/min Arterial Pressure (mmHg): -110 mmHg Venous Pressure (mmHg): 230 mmHg TMP (mmHg): 10 mmHg Ultrafiltration Rate (mL/min): 701 mL/min Dialysate Flow Rate (mL/min): 300 ml/min Dialysis Fluid Bolus: Meds Bolus Amount (mL): 100 mL  Resting well during treatment  Objective:  Vital signs in last 24 hours:  Temp:  [97.8 F (36.6 C)-100.5 F (38.1 C)] 97.8 F (36.6 C) (09/08 1123) Pulse Rate:  [67-84] 72 (09/08 1128) Resp:  [13-22] 16 (09/08 0930) BP: (131-181)/(42-128) 151/68 (09/08 1128) SpO2:  [70 %-100 %] 100 % (09/08 1128) Weight:  [116.4 kg] 116.4 kg (09/08 0740)  Weight change:  Filed Weights   03/30/22 1849 03/31/22 0500 04/01/22 0740  Weight: 120.3 kg 113.4 kg 116.4 kg    Intake/Output: I/O last 3 completed shifts: In: 1127.7 [P.O.:1080; IV Piggyback:47.7] Out: 501 [Urine:500; Stool:1]   Intake/Output this shift:  Total I/O In: -  Out: 1500 [Other:1500]  Physical Exam: General: NAD, resting comfortably  Head: Normocephalic, atraumatic. Moist oral mucosal membranes  Eyes: Anicteric  Lungs:  Clear to auscultation, normal effort   Heart: Regular rate and rhythm  Abdomen:  Soft, tender in the lower abdominal area, obese  Extremities: trace peripheral edema.  Neurologic: Alert, oriented to person  Skin: No lesions  Access: Left aVG    Basic Metabolic Panel: Recent Labs  Lab 03/30/22 0753 03/31/22 0329 04/01/22 0537  NA 138 137 140  K 4.4 4.1 4.3  CL 101 99 101  CO2 '28 29 28  '$ GLUCOSE 130* 170* 135*  BUN 22 15 25*  CREATININE 8.22* 6.01* 8.14*  CALCIUM 9.9 9.0 9.3  PHOS 3.5  --   --      Liver Function Tests: Recent Labs  Lab 03/30/22 0753  ALBUMIN 2.8*    No results for input(s): "LIPASE", "AMYLASE" in the last 168 hours. No results for input(s): "AMMONIA" in the last 168 hours.  CBC: Recent Labs  Lab 03/27/22 0447 03/28/22 1000 03/29/22 1916 03/30/22 0753 03/31/22 0329 04/01/22 0537  WBC 11.9*  --  12.0* 11.3* 11.7* 10.7*  NEUTROABS  --   --   --  7.1  --   --   HGB 8.2* 7.7* 9.0* 8.6* 8.5* 7.8*  HCT 26.4* 25.2* 29.7* 27.9* 27.9* 25.7*  MCV 88.3  --  89.7 88.6 88.6 89.2  PLT 277  --  255 264 230 200     Cardiac Enzymes: No results for input(s): "CKTOTAL", "CKMB", "CKMBINDEX", "TROPONINI" in the last 168 hours.  BNP: Invalid input(s): "POCBNP"  CBG: Recent Labs  Lab 03/31/22 0759 03/31/22 1146 03/31/22 1700 03/31/22 2021 04/01/22 0725  GLUCAP 132* 172* 121* 132* 143*     Microbiology: Results for orders placed or performed during the hospital encounter of 03/19/22  Resp Panel  by RT-PCR (Flu A&B, Covid) Anterior Nasal Swab     Status: None   Collection Time: 03/19/22  9:28 PM   Specimen: Anterior Nasal Swab  Result Value Ref Range Status   SARS Coronavirus 2 by RT PCR NEGATIVE NEGATIVE Final    Comment: (NOTE) SARS-CoV-2 target nucleic acids are NOT DETECTED.  The SARS-CoV-2 RNA is generally detectable in upper respiratory specimens during the acute phase of infection. The lowest concentration of SARS-CoV-2 viral copies this assay can detect is 138 copies/mL. A  negative result does not preclude SARS-Cov-2 infection and should not be used as the sole basis for treatment or other patient management decisions. A negative result may occur with  improper specimen collection/handling, submission of specimen other than nasopharyngeal swab, presence of viral mutation(s) within the areas targeted by this assay, and inadequate number of viral copies(<138 copies/mL). A negative result must be combined with clinical observations, patient history, and epidemiological information. The expected result is Negative.  Fact Sheet for Patients:  EntrepreneurPulse.com.au  Fact Sheet for Healthcare Providers:  IncredibleEmployment.be  This test is no t yet approved or cleared by the [place] FDA and  has been authorized for detection and/or diagnosis of SARS-CoV-2 by FDA under an Emergency Use Authorization (EUA). This EUA will remain  in effect (meaning this test can be used) for the duration of the COVID-19 declaration under Section 564(b)(1) of the Act, 21 U.S.C.section 360bbb-3(b)(1), unless the authorization is terminated  or revoked sooner.       Influenza A by PCR NEGATIVE NEGATIVE Final   Influenza B by PCR NEGATIVE NEGATIVE Final    Comment: (NOTE) The Xpert Xpress SARS-CoV-2/FLU/RSV plus assay is intended as an aid in the diagnosis of influenza from Nasopharyngeal swab specimens and should not be used as a sole basis for treatment. Nasal washings and aspirates are unacceptable for Xpert Xpress SARS-CoV-2/FLU/RSV testing.  Fact Sheet for Patients: EntrepreneurPulse.com.au  Fact Sheet for Healthcare Providers: IncredibleEmployment.be  This test is not yet approved or cleared by the [place] FDA and has been authorized for detection and/or diagnosis of SARS-CoV-2 by FDA under an Emergency Use Authorization (EUA). This EUA will remain in effect (meaning this test can  be used) for the duration of the COVID-19 declaration under Section 564(b)(1) of the Act, 21 U.S.C. section 360bbb-3(b)(1), unless the authorization is terminated or revoked.  Performed at [facility], [address] [place], [place] [address]   Blood Culture (routine x 2)     Status: Abnormal   Collection Time: 03/19/22  9:28 PM   Specimen: BLOOD  Result Value Ref Range Status   Specimen Description   Final    BLOOD RIGHT ANTECUBITAL Performed at [facility], [address], [place], [place] [address]    Special Requests   Final    BOTTLES DRAWN AEROBIC AND ANAEROBIC Blood Culture adequate volume Performed at [facility], [address] [place], [place] [address]    Culture  Setup Time   Final    GRAM POSITIVE COCCI IN BOTH AEROBIC AND ANAEROBIC BOTTLES CRITICAL RESULT CALLED TO, READ BACK BY AND VERIFIED WITH: CAROLYN CHILDS 03/20/22 1457 AMK Performed at [facility], [place] [place],  [address]    Culture STAPHYLOCOCCUS CAPITIS (A)  Final   Report Status 03/23/2022 FINAL  Final   Organism ID, Bacteria STAPHYLOCOCCUS CAPITIS  Final      Susceptibility   Staphylococcus capitis - MIC*    CIPROFLOXACIN <=0.5 SENSITIVE Sensitive  ERYTHROMYCIN <=0.25 SENSITIVE Sensitive     GENTAMICIN <=0.5 SENSITIVE Sensitive     OXACILLIN <=0.25 SENSITIVE Sensitive     TETRACYCLINE <=1 SENSITIVE Sensitive     VANCOMYCIN <=0.5 SENSITIVE Sensitive     TRIMETH/SULFA <=10 SENSITIVE Sensitive     CLINDAMYCIN <=0.25 SENSITIVE Sensitive     RIFAMPIN <=0.5 SENSITIVE Sensitive     Inducible Clindamycin NEGATIVE Sensitive     * STAPHYLOCOCCUS CAPITIS  Blood Culture ID Panel (Reflexed)     Status: Abnormal   Collection Time: 03/19/22  9:28 PM  Result Value Ref Range Status   Enterococcus faecalis NOT DETECTED NOT DETECTED Final   Enterococcus Faecium NOT DETECTED NOT DETECTED Final   Listeria monocytogenes NOT DETECTED NOT DETECTED  Final   Staphylococcus species DETECTED (A) NOT DETECTED Final    Comment: CRITICAL RESULT CALLED TO, READ BACK BY AND VERIFIED WITH: CAROLYN CHILDS 03/20/22 1457 AMK    Staphylococcus aureus (BCID) NOT DETECTED NOT DETECTED Final   Staphylococcus epidermidis NOT DETECTED NOT DETECTED Final   Staphylococcus lugdunensis NOT DETECTED NOT DETECTED Final   Streptococcus species NOT DETECTED NOT DETECTED Final   Streptococcus agalactiae NOT DETECTED NOT DETECTED Final   Streptococcus pneumoniae NOT DETECTED NOT DETECTED Final   Streptococcus pyogenes NOT DETECTED NOT DETECTED Final   A.calcoaceticus-baumannii NOT DETECTED NOT DETECTED Final   Bacteroides fragilis NOT DETECTED NOT DETECTED Final   Enterobacterales NOT DETECTED NOT DETECTED Final   Enterobacter cloacae complex NOT DETECTED NOT DETECTED Final   Escherichia coli NOT DETECTED NOT DETECTED Final   Klebsiella aerogenes NOT DETECTED NOT DETECTED Final   Klebsiella oxytoca NOT DETECTED NOT DETECTED Final   Klebsiella pneumoniae NOT DETECTED NOT DETECTED Final   Proteus species NOT DETECTED NOT DETECTED Final   Salmonella species NOT DETECTED NOT DETECTED Final   Serratia marcescens NOT DETECTED NOT DETECTED Final   Haemophilus influenzae NOT DETECTED NOT DETECTED Final   Neisseria meningitidis NOT DETECTED NOT DETECTED Final   Pseudomonas aeruginosa NOT DETECTED NOT DETECTED Final   Stenotrophomonas maltophilia NOT DETECTED NOT DETECTED Final   Candida albicans NOT DETECTED NOT DETECTED Final   Candida auris NOT DETECTED NOT DETECTED Final   Candida glabrata NOT DETECTED NOT DETECTED Final   Candida krusei NOT DETECTED NOT DETECTED Final   Candida parapsilosis NOT DETECTED NOT DETECTED Final   Candida tropicalis NOT DETECTED NOT DETECTED Final   Cryptococcus neoformans/gattii NOT DETECTED NOT DETECTED Final    Comment: Performed at [facility], [address] [place], [place] [address]  Blood Culture (routine x 2)      Status: Abnormal   Collection Time: 03/19/22 10:23 PM   Specimen: BLOOD  Result Value Ref Range Status   Specimen Description   Final    BLOOD BLOOD RIGHT FOREARM Performed at [facility], [address], [place], [place] [address]    Special Requests   Final    BOTTLES DRAWN AEROBIC AND ANAEROBIC Blood Culture adequate volume Performed at [facility], [address] [place], [place] [address]    Culture  Setup Time   Final    GRAM POSITIVE COCCI IN BOTH AEROBIC AND ANAEROBIC BOTTLES CRITICAL RESULT CALLED TO, READ BACK BY AND VERIFIED WITH: ELVERA MADUEME 03/20/22 1545 AMK Performed at [facility], [address] [place], [place] [address]    Culture (A)  Final    STAPHYLOCOCCUS CAPITIS SUSCEPTIBILITIES PERFORMED ON PREVIOUS CULTURE WITHIN THE LAST 5 DAYS. Performed at [facility],  [address] [place], [place] [address]    Report Status 03/22/2022 FINAL  Final  Urine Culture     Status: Abnormal   Collection Time: 03/20/22  1:25 AM   Specimen: In/Out Cath Urine  Result Value Ref Range Status   Specimen Description   Final    IN/OUT CATH URINE Performed at [facility], [address] [place], [place] [address]    Special Requests   Final    NONE Performed at [facility], [address] [place], [place] [address]    Culture 4,000 COLONIES/mL STAPHYLOCOCCUS EPIDERMIDIS (A)  Final   Report Status 03/22/2022 FINAL  Final   Organism ID, Bacteria STAPHYLOCOCCUS EPIDERMIDIS (A)  Final      Susceptibility   Staphylococcus epidermidis - MIC*    CIPROFLOXACIN <=0.5 SENSITIVE Sensitive     GENTAMICIN <=0.5 SENSITIVE Sensitive     NITROFURANTOIN <=16 SENSITIVE Sensitive     OXACILLIN <=0.25 SENSITIVE Sensitive     TETRACYCLINE <=1 SENSITIVE Sensitive     VANCOMYCIN 2 SENSITIVE Sensitive     TRIMETH/SULFA 80 RESISTANT Resistant     CLINDAMYCIN >=8 RESISTANT Resistant     RIFAMPIN <=0.5 SENSITIVE Sensitive      Inducible Clindamycin NEGATIVE Sensitive     * 4,000 COLONIES/mL STAPHYLOCOCCUS EPIDERMIDIS  Culture, blood (Routine X 2) w Reflex to ID Panel     Status: None   Collection Time: 03/21/22 11:00 AM   Specimen: BLOOD  Result Value Ref Range Status   Specimen Description BLOOD BLOOD RIGHT FOREARM  Final   Special Requests   Final    BOTTLES DRAWN AEROBIC AND ANAEROBIC Blood Culture adequate volume   Culture   Final    NO GROWTH 5 DAYS Performed at [facility], [address] [place], [place] [address]    Report Status 03/26/2022 FINAL  Final  Culture, blood (Routine X 2) w Reflex to ID Panel     Status: None   Collection Time: 03/22/22 11:53 AM   Specimen: BLOOD RIGHT HAND  Result Value Ref Range Status   Specimen Description BLOOD RIGHT HAND  Final   Special Requests   Final    BOTTLES DRAWN AEROBIC AND ANAEROBIC Blood Culture adequate volume   Culture   Final    NO GROWTH 5 DAYS Performed at [facility], [address], [place], [place] [address]    Report Status 03/27/2022 FINAL  Final  Aerobic/Anaerobic Culture w Gram Stain (surgical/deep wound)     Status: None   Collection Time: 03/25/22 10:30 AM   Specimen: Wound; Blood  Result Value Ref Range Status   Specimen Description WOUND LEFT BREAST  Final   Special Requests BLOOD HEMATOMA  Final   Gram Stain   Final    RARE WBC PRESENT, PREDOMINANTLY MONONUCLEAR NO ORGANISMS SEEN    Culture   Final    No growth aerobically or anaerobically. Performed at [facility], [place] [address], [place], [place] [address]    Report Status 03/30/2022 FINAL  Final    Coagulation Studies: No results for input(s): "LABPROT", "INR" in the last 72 hours.   Urinalysis: No results for input(s): "COLORURINE", "LABSPEC", "PHURINE", "GLUCOSEU", "HGBUR", "BILIRUBINUR", "KETONESUR", "PROTEINUR", "UROBILINOGEN", "NITRITE", "LEUKOCYTESUR" in the last 72 hours.  Invalid input(s): "APPERANCEUR"     Imaging: No  results found.   Medications:    sodium chloride 10 mL/hr at 03/30/22 7591    apixaban  10 mg Oral BID   Followed  by   Derrill Memo ON 04/03/2022] apixaban  5 mg Oral BID   atorvastatin  80 mg Oral QPM   calcium acetate  667 mg Oral TID WC   Chlorhexidine Gluconate Cloth  6 each Topical Q0600   vitamin D3  1,000 Units Oral Daily   cinacalcet  30 mg Oral Q breakfast   docusate sodium  200 mg Oral BID   epoetin alfa       epoetin (EPOGEN/PROCRIT) injection  10,000 Units Intravenous Q M,W,F-HD   fluticasone  1 spray Each Nare Daily   gabapentin  100 mg Oral BID   insulin aspart  0-6 Units Subcutaneous TID WC   insulin aspart protamine- aspart  10 Units Subcutaneous BID WC   levETIRAcetam  1,000 mg Oral Daily   levETIRAcetam  250 mg Oral Once per day on Mon Wed Fri   metoprolol succinate  50 mg Oral Q breakfast   pantoprazole  40 mg Oral Daily   polyethylene glycol  17 g Oral Daily   torsemide  40 mg Oral Once per day on Sun Tue Thu Sat   sodium chloride, acetaminophen **OR** acetaminophen, diphenoxylate-atropine, epoetin alfa, glucose, ipratropium-albuterol, lidocaine-prilocaine, magnesium hydroxide, metoCLOPramide (REGLAN) injection, oxyCODONE, traZODone  Assessment/ Plan:  Ms. Jatziry Wechter is a 71 y.o.  female ast medical history including COPD, diabetes, carotid stenosis, hypertension, and end-stage renal disease on hemodialysis.  Patient presents to the emergency department with complaints of weakness, fatigue and fever.  Patient has been admitted for ESRD on hemodialysis ([place]) [N18.6, Z99.2] Acute pulmonary embolism without acute cor pulmonale ([place]) [I26.99] Sepsis with acute hypoxic respiratory failure and septic shock, due to unspecified organism ([place]) [A41.9, R65.21, J96.01]  [facility]/MWF/left aVF  End-stage renal disease on hemodialysis.    Receiving dialysis today, UF goal 1-1.5L as tolerated. Next treatment scheduled for Monday. [place] planned  after dialysis today, no outpatient needs required.   2. Anemia of chronic kidney disease Lab Results  Component Value Date   HGB 7.8 (L) 04/01/2022    Patient receives [place] outpatient.  Received blood transfusion during this admission.   Hgb below target. Continue EPO with treatments.   3. Secondary Hyperparathyroidism: with outpatient labs: PTH 613, phosphorus 4.8, calcium 9.0 on 03/07/2022.   Lab Results  Component Value Date   PTH 131 (H) 03/14/2018   CALCIUM 9.3 04/01/2022   PHOS 3.5 03/30/2022   Calcium and phosphorus within desired target.  4. Diabetes mellitus type II with chronic kidney disease: insulin dependent. Home regimen includes Novolin. Most recent hemoglobin A1c is 7.5 on 01/29/20.  Glucose well controlled.  Primary team to manage sliding scale insulin.  5.  Hypertension with chronic kidney disease.  Home regimen includes losartan, metoprolol, and torsemide.     Receiving daily metoprolol and Torsemide on nondialysis days only.  Blood pressure stable during dialysis, 131/63.  6. Left breast cellulitis. Left breast US shows seroma vs abcess. Culture grew staph capitis. Being treated with cefazolin.  Patient received last dose during dialysis today.   LOS: [place] 9/8/202312:06 PM

## 2022-04-01 NOTE — Progress Notes (Signed)
Pt 3.5 hour HD complete w/ no complications. Report to primary RN. Pt alert, no c/o, vss. Start: 6433 End: 1123 1539m fluid removed 81.1L BVP 115 Kg post bed weight Epogen 10000u and Ancef IV given w/ HD

## 2022-04-01 NOTE — Progress Notes (Signed)
Pre HD RN assessment 

## 2022-04-01 NOTE — Progress Notes (Signed)
Report given to dialysis nurse. Will call prior to coming to retreive patient for dialysis this morning.

## 2022-04-01 NOTE — Progress Notes (Signed)
PT Cancellation Note  Patient Details Name: Cynthia Dean MRN: [id number] DOB: 02/04/1951   Cancelled Treatment:     PT attempt. Pt is off floor in HD. Will continue to follow per current POC.    Willette Pa 04/01/2022, 8:27 AM

## 2022-05-08 DIAGNOSIS — Z888 Allergy status to other drugs, medicaments and biological substances status: Secondary | ICD-10-CM

## 2022-05-08 DIAGNOSIS — Z803 Family history of malignant neoplasm of breast: Secondary | ICD-10-CM

## 2022-05-08 DIAGNOSIS — Z6837 Body mass index (BMI) 37.0-37.9, adult: Secondary | ICD-10-CM

## 2022-05-08 DIAGNOSIS — Z7901 Long term (current) use of anticoagulants: Secondary | ICD-10-CM

## 2022-05-08 DIAGNOSIS — I2510 Atherosclerotic heart disease of native coronary artery without angina pectoris: Secondary | ICD-10-CM | POA: Diagnosis present

## 2022-05-08 DIAGNOSIS — N186 End stage renal disease: Secondary | ICD-10-CM | POA: Diagnosis present

## 2022-05-08 DIAGNOSIS — I5032 Chronic diastolic (congestive) heart failure: Secondary | ICD-10-CM | POA: Diagnosis present

## 2022-05-08 DIAGNOSIS — M5416 Radiculopathy, lumbar region: Secondary | ICD-10-CM | POA: Diagnosis present

## 2022-05-08 DIAGNOSIS — E669 Obesity, unspecified: Secondary | ICD-10-CM | POA: Diagnosis present

## 2022-05-08 DIAGNOSIS — Z8249 Family history of ischemic heart disease and other diseases of the circulatory system: Secondary | ICD-10-CM

## 2022-05-08 DIAGNOSIS — E785 Hyperlipidemia, unspecified: Secondary | ICD-10-CM | POA: Diagnosis present

## 2022-05-08 DIAGNOSIS — M79604 Pain in right leg: Secondary | ICD-10-CM | POA: Diagnosis not present

## 2022-05-08 DIAGNOSIS — Z885 Allergy status to narcotic agent status: Secondary | ICD-10-CM

## 2022-05-08 DIAGNOSIS — Z86711 Personal history of pulmonary embolism: Secondary | ICD-10-CM

## 2022-05-08 DIAGNOSIS — Z992 Dependence on renal dialysis: Secondary | ICD-10-CM

## 2022-05-08 DIAGNOSIS — Z853 Personal history of malignant neoplasm of breast: Secondary | ICD-10-CM

## 2022-05-08 DIAGNOSIS — E1142 Type 2 diabetes mellitus with diabetic polyneuropathy: Secondary | ICD-10-CM | POA: Diagnosis not present

## 2022-05-08 DIAGNOSIS — Z9221 Personal history of antineoplastic chemotherapy: Secondary | ICD-10-CM

## 2022-05-08 DIAGNOSIS — R197 Diarrhea, unspecified: Secondary | ICD-10-CM | POA: Diagnosis present

## 2022-05-08 DIAGNOSIS — F32A Depression, unspecified: Secondary | ICD-10-CM | POA: Diagnosis present

## 2022-05-08 DIAGNOSIS — E1122 Type 2 diabetes mellitus with diabetic chronic kidney disease: Secondary | ICD-10-CM | POA: Diagnosis present

## 2022-05-08 DIAGNOSIS — Z8616 Personal history of COVID-19: Secondary | ICD-10-CM

## 2022-05-08 DIAGNOSIS — Z87891 Personal history of nicotine dependence: Secondary | ICD-10-CM

## 2022-05-08 DIAGNOSIS — Z79899 Other long term (current) drug therapy: Secondary | ICD-10-CM

## 2022-05-08 DIAGNOSIS — N2581 Secondary hyperparathyroidism of renal origin: Secondary | ICD-10-CM | POA: Diagnosis present

## 2022-05-08 DIAGNOSIS — Z794 Long term (current) use of insulin: Secondary | ICD-10-CM

## 2022-05-08 DIAGNOSIS — J449 Chronic obstructive pulmonary disease, unspecified: Secondary | ICD-10-CM | POA: Diagnosis present

## 2022-05-08 DIAGNOSIS — Z7982 Long term (current) use of aspirin: Secondary | ICD-10-CM

## 2022-05-08 DIAGNOSIS — D631 Anemia in chronic kidney disease: Secondary | ICD-10-CM | POA: Diagnosis present

## 2022-05-08 DIAGNOSIS — I132 Hypertensive heart and chronic kidney disease with heart failure and with stage 5 chronic kidney disease, or end stage renal disease: Secondary | ICD-10-CM | POA: Diagnosis present

## 2022-05-08 DIAGNOSIS — Z823 Family history of stroke: Secondary | ICD-10-CM

## 2022-05-08 DIAGNOSIS — R569 Unspecified convulsions: Secondary | ICD-10-CM | POA: Diagnosis present

## 2022-05-08 DIAGNOSIS — Z8673 Personal history of transient ischemic attack (TIA), and cerebral infarction without residual deficits: Secondary | ICD-10-CM
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
## 2022-05-08 NOTE — ED Notes (Signed)
Patient to waiting room from home via EMS.  Per EMS patient recently discharged from rehab facility following PE.  Patient complaint tonight of right leg pain that radiates up towards right hip and unable to bear weight.  Denies any type of injury.  EMS reports right leg larger than left, no discoloration or warmth noted.  EMS interventions temp 98.4, hr 72, bp 168/74, cbg 161.

## 2022-05-09 ENCOUNTER — Inpatient Hospital Stay
Admission: EM | Admit: 2022-05-09 | Discharge: 2022-05-16 | DRG: 073 | Disposition: A | Discharge status: Skilled Nursing Facility | Payer: Medicare Other | Attending: Internal Medicine | Admitting: Internal Medicine

## 2022-05-09 ENCOUNTER — Encounter: Payer: Self-pay | Admitting: Family Medicine

## 2022-05-09 ENCOUNTER — Ambulatory Visit: Payer: Medicare Other

## 2022-05-09 ENCOUNTER — Emergency Department: Payer: Medicare Other

## 2022-05-09 ENCOUNTER — Other Ambulatory Visit: Payer: Self-pay

## 2022-05-09 DIAGNOSIS — I639 Cerebral infarction, unspecified: Secondary | ICD-10-CM | POA: Diagnosis present

## 2022-05-09 DIAGNOSIS — Z6837 Body mass index (BMI) 37.0-37.9, adult: Secondary | ICD-10-CM | POA: Diagnosis not present

## 2022-05-09 DIAGNOSIS — R262 Difficulty in walking, not elsewhere classified: Secondary | ICD-10-CM | POA: Diagnosis not present

## 2022-05-09 DIAGNOSIS — Z992 Dependence on renal dialysis: Secondary | ICD-10-CM | POA: Diagnosis not present

## 2022-05-09 DIAGNOSIS — I2510 Atherosclerotic heart disease of native coronary artery without angina pectoris: Secondary | ICD-10-CM | POA: Diagnosis present

## 2022-05-09 DIAGNOSIS — I5032 Chronic diastolic (congestive) heart failure: Secondary | ICD-10-CM | POA: Diagnosis present

## 2022-05-09 DIAGNOSIS — E669 Obesity, unspecified: Secondary | ICD-10-CM | POA: Diagnosis present

## 2022-05-09 DIAGNOSIS — M79604 Pain in right leg: Secondary | ICD-10-CM | POA: Diagnosis present

## 2022-05-09 DIAGNOSIS — R197 Diarrhea, unspecified: Secondary | ICD-10-CM | POA: Diagnosis present

## 2022-05-09 DIAGNOSIS — I2699 Other pulmonary embolism without acute cor pulmonale: Secondary | ICD-10-CM | POA: Diagnosis present

## 2022-05-09 DIAGNOSIS — J449 Chronic obstructive pulmonary disease, unspecified: Secondary | ICD-10-CM | POA: Diagnosis present

## 2022-05-09 DIAGNOSIS — F32A Depression, unspecified: Secondary | ICD-10-CM | POA: Diagnosis present

## 2022-05-09 DIAGNOSIS — Z87891 Personal history of nicotine dependence: Secondary | ICD-10-CM | POA: Diagnosis not present

## 2022-05-09 DIAGNOSIS — E785 Hyperlipidemia, unspecified: Secondary | ICD-10-CM | POA: Diagnosis present

## 2022-05-09 DIAGNOSIS — N2581 Secondary hyperparathyroidism of renal origin: Secondary | ICD-10-CM | POA: Diagnosis present

## 2022-05-09 DIAGNOSIS — D631 Anemia in chronic kidney disease: Secondary | ICD-10-CM | POA: Diagnosis present

## 2022-05-09 DIAGNOSIS — Z9221 Personal history of antineoplastic chemotherapy: Secondary | ICD-10-CM | POA: Diagnosis not present

## 2022-05-09 DIAGNOSIS — G40909 Epilepsy, unspecified, not intractable, without status epilepticus: Secondary | ICD-10-CM

## 2022-05-09 DIAGNOSIS — Z794 Long term (current) use of insulin: Secondary | ICD-10-CM | POA: Diagnosis not present

## 2022-05-09 DIAGNOSIS — E1142 Type 2 diabetes mellitus with diabetic polyneuropathy: Secondary | ICD-10-CM | POA: Diagnosis present

## 2022-05-09 DIAGNOSIS — Z7901 Long term (current) use of anticoagulants: Secondary | ICD-10-CM | POA: Diagnosis not present

## 2022-05-09 DIAGNOSIS — E1122 Type 2 diabetes mellitus with diabetic chronic kidney disease: Secondary | ICD-10-CM | POA: Diagnosis present

## 2022-05-09 DIAGNOSIS — R531 Weakness: Secondary | ICD-10-CM | POA: Diagnosis not present

## 2022-05-09 DIAGNOSIS — N186 End stage renal disease: Secondary | ICD-10-CM

## 2022-05-09 DIAGNOSIS — R569 Unspecified convulsions: Secondary | ICD-10-CM | POA: Diagnosis present

## 2022-05-09 DIAGNOSIS — M5416 Radiculopathy, lumbar region: Secondary | ICD-10-CM | POA: Diagnosis present

## 2022-05-09 DIAGNOSIS — I132 Hypertensive heart and chronic kidney disease with heart failure and with stage 5 chronic kidney disease, or end stage renal disease: Secondary | ICD-10-CM | POA: Diagnosis present

## 2022-05-09 DIAGNOSIS — Z8616 Personal history of COVID-19: Secondary | ICD-10-CM | POA: Diagnosis not present

## 2022-05-09 DIAGNOSIS — Z79899 Other long term (current) drug therapy: Secondary | ICD-10-CM | POA: Diagnosis not present

## 2022-05-09 DIAGNOSIS — Z853 Personal history of malignant neoplasm of breast: Secondary | ICD-10-CM | POA: Diagnosis not present

## 2022-05-09 DIAGNOSIS — D649 Anemia, unspecified: Secondary | ICD-10-CM | POA: Diagnosis present

## 2022-05-09 DIAGNOSIS — I10 Essential (primary) hypertension: Secondary | ICD-10-CM | POA: Diagnosis present

## 2022-05-09 LAB — BASIC METABOLIC PANEL
Anion gap: 9 (ref 5–15)
BUN: 42 mg/dL — ABNORMAL HIGH (ref 8–23)
CO2: 24 mmol/L (ref 22–32)
Calcium: 8.6 mg/dL — ABNORMAL LOW (ref 8.9–10.3)
Chloride: 106 mmol/L (ref 98–111)
Creatinine, Ser: 9.38 mg/dL — ABNORMAL HIGH (ref 0.44–1.00)
GFR, Estimated: 4 mL/min — ABNORMAL LOW (ref >60)
Glucose, Bld: 164 mg/dL — ABNORMAL HIGH (ref 70–99)
Potassium: 4 mmol/L (ref 3.5–5.1)
Sodium: 139 mmol/L (ref 135–145)

## 2022-05-09 LAB — CBC
HCT: 27.4 % — ABNORMAL LOW (ref 36.0–46.0)
Hemoglobin: 8.4 g/dL — ABNORMAL LOW (ref 12.0–15.0)
MCH: 27.5 pg (ref 26.0–34.0)
MCHC: 30.7 g/dL (ref 30.0–36.0)
MCV: 89.8 fL (ref 80.0–100.0)
Platelets: 254 10*3/uL (ref 150–400)
RBC: 3.05 MIL/uL — ABNORMAL LOW (ref 3.87–5.11)
RDW: 14.6 % (ref 11.5–15.5)
WBC: 10.2 10*3/uL (ref 4.0–10.5)
nRBC: 0 % (ref 0.0–0.2)

## 2022-05-09 LAB — HEPATITIS B SURFACE ANTIGEN: Hepatitis B Surface Ag: NONREACTIVE

## 2022-05-09 LAB — CBG MONITORING, ED: Glucose-Capillary: 158 mg/dL — ABNORMAL HIGH (ref 70–99)

## 2022-05-09 LAB — HEPATITIS B SURFACE ANTIBODY,QUALITATIVE: Hep B S Ab: REACTIVE — AB

## 2022-05-09 LAB — CK: Total CK: 29 U/L — ABNORMAL LOW (ref 38–234)

## 2022-05-09 MED ORDER — PANTOPRAZOLE SODIUM 40 MG PO TBEC
40.0000 mg | DELAYED_RELEASE_TABLET | Freq: Every day | ORAL | Status: DC
  Administered 2022-05-09 – 2022-05-16 (×8): 40 mg via ORAL
  Filled 2022-05-09 (×8): qty 1

## 2022-05-09 MED ORDER — PENTAFLUOROPROP-TETRAFLUOROETH EX AERO: 1.0000 | INHALATION_SPRAY | CUTANEOUS | Status: DC | PRN

## 2022-05-09 MED ORDER — CHLORHEXIDINE GLUCONATE CLOTH 2 % EX PADS
6.0000 | MEDICATED_PAD | Freq: Every day | CUTANEOUS | Status: DC
  Administered 2022-05-09 – 2022-05-16 (×4): 6 via TOPICAL
  Filled 2022-05-09 (×2): qty 6

## 2022-05-09 MED ORDER — LIDOCAINE 5 % EX PTCH
1.0000 | MEDICATED_PATCH | CUTANEOUS | Status: DC
Start: 2022-05-09 — End: 2022-05-17
  Administered 2022-05-09 – 2022-05-16 (×8): 1 via TRANSDERMAL
  Filled 2022-05-09 (×9): qty 1

## 2022-05-09 MED ORDER — CINACALCET HCL 30 MG PO TABS
30.0000 mg | ORAL_TABLET | Freq: Every day | ORAL | Status: DC
  Administered 2022-05-10 – 2022-05-16 (×6): 30 mg via ORAL
  Filled 2022-05-09 (×7): qty 1

## 2022-05-09 MED ORDER — IPRATROPIUM-ALBUTEROL 0.5-2.5 (3) MG/3ML IN SOLN
3.0000 mL | Freq: Two times a day (BID) | RESPIRATORY_TRACT | Status: DC
  Administered 2022-05-09 – 2022-05-10 (×2): 3 mL via RESPIRATORY_TRACT
  Filled 2022-05-09 (×2): qty 3

## 2022-05-09 MED ORDER — DIPHENOXYLATE-ATROPINE 2.5-0.025 MG PO TABS: 1.0000 | ORAL_TABLET | Freq: Four times a day (QID) | ORAL | Status: DC | PRN

## 2022-05-09 MED ORDER — LEVETIRACETAM 250 MG PO TABS
250.0000 mg | ORAL_TABLET | ORAL | Status: DC
  Administered 2022-05-09 – 2022-05-16 (×4): 250 mg via ORAL
  Filled 2022-05-09 (×5): qty 1

## 2022-05-09 MED ORDER — SERTRALINE HCL 50 MG PO TABS
25.0000 mg | ORAL_TABLET | Freq: Every day | ORAL | Status: DC
  Administered 2022-05-09 – 2022-05-16 (×8): 25 mg via ORAL
  Filled 2022-05-09 (×9): qty 1

## 2022-05-09 MED ORDER — HEPARIN SODIUM (PORCINE) 1000 UNIT/ML DIALYSIS: 1000.0000 [IU] | INTRAMUSCULAR | Status: DC | PRN

## 2022-05-09 MED ORDER — FENTANYL CITRATE PF 50 MCG/ML IJ SOSY
12.5000 ug | PREFILLED_SYRINGE | INTRAMUSCULAR | Status: DC | PRN
  Administered 2022-05-09: 12.5 ug via INTRAVENOUS
  Filled 2022-05-09: qty 1

## 2022-05-09 MED ORDER — ONDANSETRON HCL 4 MG/2ML IJ SOLN: 4.0000 mg | Freq: Three times a day (TID) | INTRAMUSCULAR | Status: DC | PRN

## 2022-05-09 MED ORDER — APIXABAN 5 MG PO TABS
5.0000 mg | ORAL_TABLET | Freq: Two times a day (BID) | ORAL | Status: DC
  Administered 2022-05-09 – 2022-05-16 (×15): 5 mg via ORAL
  Filled 2022-05-09 (×15): qty 1

## 2022-05-09 MED ORDER — FLUTICASONE PROPIONATE 50 MCG/ACT NA SUSP: 1.0000 | Freq: Every day | NASAL | Status: DC | PRN

## 2022-05-09 MED ORDER — LIDOCAINE-PRILOCAINE 2.5-2.5 % EX CREA: 1.0000 | TOPICAL_CREAM | CUTANEOUS | Status: DC | PRN

## 2022-05-09 MED ORDER — ACETAMINOPHEN 325 MG PO TABS
650.0000 mg | ORAL_TABLET | Freq: Four times a day (QID) | ORAL | Status: DC | PRN
  Administered 2022-05-10 – 2022-05-14 (×3): 650 mg via ORAL
  Filled 2022-05-09 (×4): qty 2

## 2022-05-09 MED ORDER — GABAPENTIN 600 MG PO TABS
300.0000 mg | ORAL_TABLET | Freq: Two times a day (BID) | ORAL | Status: DC
  Administered 2022-05-09 – 2022-05-16 (×14): 300 mg via ORAL
  Filled 2022-05-09 (×14): qty 1

## 2022-05-09 MED ORDER — HYDRALAZINE HCL 20 MG/ML IJ SOLN: 5.0000 mg | INTRAMUSCULAR | Status: DC | PRN

## 2022-05-09 MED ORDER — DM-GUAIFENESIN ER 30-600 MG PO TB12: 1.0000 | ORAL_TABLET | Freq: Two times a day (BID) | ORAL | Status: DC | PRN

## 2022-05-09 MED ORDER — LIDOCAINE HCL (PF) 1 % IJ SOLN: 5.0000 mL | INTRAMUSCULAR | Status: DC | PRN

## 2022-05-09 MED ORDER — LOSARTAN POTASSIUM 50 MG PO TABS
100.0000 mg | ORAL_TABLET | Freq: Every day | ORAL | Status: DC
  Administered 2022-05-09 – 2022-05-15 (×7): 100 mg via ORAL
  Filled 2022-05-09 (×7): qty 2

## 2022-05-09 MED ORDER — ALTEPLASE 2 MG IJ SOLR: 2.0000 mg | Freq: Once | INTRAMUSCULAR | Status: DC | PRN

## 2022-05-09 MED ORDER — FENTANYL CITRATE PF 50 MCG/ML IJ SOSY
50.0000 ug | PREFILLED_SYRINGE | Freq: Once | INTRAMUSCULAR | Status: AC
  Administered 2022-05-09: 50 ug via INTRAVENOUS
  Filled 2022-05-09: qty 1

## 2022-05-09 MED ORDER — KETOROLAC TROMETHAMINE 30 MG/ML IJ SOLN
15.0000 mg | Freq: Once | INTRAMUSCULAR | Status: AC
Start: 2022-05-09 — End: 2022-05-09
  Administered 2022-05-09: 15 mg via INTRAVENOUS
  Filled 2022-05-09: qty 1

## 2022-05-09 MED ORDER — INSULIN ASPART 100 UNIT/ML IJ SOLN
0.0000 [IU] | Freq: Three times a day (TID) | INTRAMUSCULAR | Status: DC
  Administered 2022-05-09: 2 [IU] via SUBCUTANEOUS
  Administered 2022-05-10 – 2022-05-11 (×4): 1 [IU] via SUBCUTANEOUS
  Administered 2022-05-12: 2 [IU] via SUBCUTANEOUS
  Administered 2022-05-12 (×2): 1 [IU] via SUBCUTANEOUS
  Administered 2022-05-13: 3 [IU] via SUBCUTANEOUS
  Administered 2022-05-13: 1 [IU] via SUBCUTANEOUS
  Administered 2022-05-14: 2 [IU] via SUBCUTANEOUS
  Administered 2022-05-14: 3 [IU] via SUBCUTANEOUS
  Administered 2022-05-14 – 2022-05-15 (×2): 2 [IU] via SUBCUTANEOUS
  Administered 2022-05-15: 3 [IU] via SUBCUTANEOUS
  Administered 2022-05-15: 1 [IU] via SUBCUTANEOUS
  Administered 2022-05-16 (×2): 2 [IU] via SUBCUTANEOUS
  Filled 2022-05-09 (×18): qty 1

## 2022-05-09 MED ORDER — ALBUTEROL SULFATE (2.5 MG/3ML) 0.083% IN NEBU: 3.0000 mL | INHALATION_SOLUTION | RESPIRATORY_TRACT | Status: DC | PRN

## 2022-05-09 MED ORDER — TORSEMIDE 20 MG PO TABS
100.0000 mg | ORAL_TABLET | Freq: Every day | ORAL | Status: DC
  Administered 2022-05-09 – 2022-05-16 (×8): 100 mg via ORAL
  Filled 2022-05-09 (×8): qty 5

## 2022-05-09 MED ORDER — VITAMIN D 25 MCG (1000 UNIT) PO TABS
1000.0000 [IU] | ORAL_TABLET | Freq: Every day | ORAL | Status: DC
  Administered 2022-05-09 – 2022-05-16 (×8): 1000 [IU] via ORAL
  Filled 2022-05-09 (×8): qty 1

## 2022-05-09 MED ORDER — INSULIN ASPART 100 UNIT/ML IJ SOLN
0.0000 [IU] | Freq: Every day | INTRAMUSCULAR | Status: DC
  Administered 2022-05-13: 3 [IU] via SUBCUTANEOUS
  Administered 2022-05-15: 2 [IU] via SUBCUTANEOUS
  Filled 2022-05-09 (×2): qty 1

## 2022-05-09 MED ORDER — TRAZODONE HCL 50 MG PO TABS
25.0000 mg | ORAL_TABLET | Freq: Every day | ORAL | Status: DC
  Administered 2022-05-09 – 2022-05-15 (×7): 25 mg via ORAL
  Filled 2022-05-09 (×7): qty 1

## 2022-05-09 MED ORDER — LORAZEPAM 2 MG/ML IJ SOLN: 1.0000 mg | INTRAMUSCULAR | Status: DC | PRN

## 2022-05-09 MED ORDER — METOPROLOL SUCCINATE ER 100 MG PO TB24
100.0000 mg | ORAL_TABLET | Freq: Every day | ORAL | Status: DC
  Administered 2022-05-09 – 2022-05-16 (×8): 100 mg via ORAL
  Filled 2022-05-09: qty 2
  Filled 2022-05-09 (×6): qty 1
  Filled 2022-05-09: qty 2

## 2022-05-09 MED ORDER — ANTICOAGULANT SODIUM CITRATE 4% (200MG/5ML) IV SOLN: 5.0000 mL | Status: DC | PRN

## 2022-05-09 MED ORDER — ASPIRIN 81 MG PO TBEC
81.0000 mg | DELAYED_RELEASE_TABLET | Freq: Every day | ORAL | Status: DC
  Administered 2022-05-09 – 2022-05-16 (×8): 81 mg via ORAL
  Filled 2022-05-09 (×8): qty 1

## 2022-05-09 NOTE — Assessment & Plan Note (Signed)
-  Seizure precaution -As needed Ativan for seizure -Continue home Keppra to 50 mg 3 times daily

## 2022-05-09 NOTE — Assessment & Plan Note (Signed)
-   Continue Eliquis 

## 2022-05-09 NOTE — Assessment & Plan Note (Signed)
Continue aspirin 

## 2022-05-09 NOTE — Assessment & Plan Note (Addendum)
No chest pain -Hold Lipitor as above -Continue aspirin

## 2022-05-09 NOTE — Progress Notes (Signed)
Post hd rn assessment 

## 2022-05-09 NOTE — Assessment & Plan Note (Signed)
-  consulted Dr. Theador Hawthorne of renal for HD

## 2022-05-09 NOTE — H&P (Signed)
History and Physical    Cynthia Dean MGQ:[id number] DOB: Jun 30, 1951 DOA: 05/09/2022  Referring MD/NP/PA:   PCP: Lowella Bandy, MD   Patient coming from:  The patient is coming from home.  At baseline, pt is independent for most of ADL.        Chief Complaint: right leg pain, weakness, unable to ambulate  HPI: Cynthia Dean is a 71 y.o. female with medical history significant of ESRD-HD (MWF), hypertension, hyperlipidemia, diabetes mellitus, stroke, depression, CAD, PE on Eliquis, seizure, dCHF, anemia, obesity, who presents with right leg pain, weakness, unable to ambulate.   Patient states that she has right lower leg pain for more than 3 months, which has worsened since yesterday.  The pain is constant, 8 out of 10 in severity, sharp, aching, nonradiating.  Denies any injury.  The pain is located in the front of right lower leg, denies tenderness to calf area.  No skin lesion.  Patient states that the pain is so severe that she cannot ambulate. No fever or chills.  Patient does not have chest pain, cough, shortness breath.  Denies nausea vomiting or abdominal pain.  Patient reports diarrhea.  She states that she had 4 times of loose stool bowel movement yesterday. Pt had negative right leg venous duplex for DVT on 10/13.  Data reviewed independently and ED Course: pt was found to have WBC 10.2, potassium 4.0, bicarbonate 24, creatinine 9.38, BUN 42, temperature normal, blood pressure 154/72, heart rate 71, 58, RR 20, oxygen saturation 96% on 1 L oxygen (patient is using as needed 2 L oxygen at home).  Chest x-ray showed cardiomegaly and vascular congestion.  X-ray of her right hip/pelvis is negative for bony fracture. X-ray of right tibia/fibula has no acute findings in the right lower leg but with  extensive arterial vascular calcifications. Has good DP/PT pulses by portable Doppler in right leg. Patient is admitted to telemetry bed as inpatient.   EKG:  Not done in  ED, will get one.     Review of Systems:   General: no fevers, chills, no body weight gain, has fatigue HEENT: no blurry vision, hearing changes or sore throat Respiratory: no dyspnea, coughing, wheezing CV: no chest pain, no palpitations GI: no nausea, vomiting, abdominal pain, has diarrhea, no constipation GU: no dysuria, burning on urination, increased urinary frequency, hematuria  Ext: no leg edema Neuro: no unilateral weakness, numbness, or tingling, no vision change or hearing loss. Skin: no rash, no skin tear. MSK: No muscle spasm, no deformity, no limitation of range of movement in spin. Has pain in the anterior right lower leg. Heme: No easy bruising.  Travel history: No recent long distant travel.   Allergy:  Allergies  Allergen Reactions   Oxycodone Nausea And Vomiting   Oxycodone-Acetaminophen Other (See Comments)   Wound Dressing Adhesive    Hydrocodone     Intolerant more than allergic   Tape Itching    Skin Dermatitis/itching (tape adhesive) Skin Dermatitis/itching (tape adhesive)   Tapentadol Itching    Skin Dermatitis/itching (tape adhesive) Skin Dermatitis/itching (tape adhesive)     Past Medical History:  Diagnosis Date   Acute on chronic respiratory failure with hypoxia ([place]) 07/30/2019   Acute respiratory failure with hypoxia ([place]) 12/31/2018   Carotid arterial disease ([place])    a. 02/2018 < bilat ICA stenosis.   COPD (chronic obstructive pulmonary disease) (HCC)    Coronary artery disease    a. 11/2015 MV: EF 57%, no ischemia/infarct.  Cryptogenic stroke ([place])    a. 10/2017 s/p implantable loop recorder (no Afib to date).   Diabetes mellitus without complication ([place])    Diarrhea    ESRD on dialysis [facility])    GI bleed    a. 01/2019 EGD/Colonoscopy: Mild gastritis. Colitis.   Heart murmur    a. 12/2018 Echo: EF 55-60%, impaired relaxation. Mod dil LA. Mildly dil RA. Mild Ca2+ of AoV.   Hematemesis 06/27/2017   History of 2019 novel coronavirus disease  (COVID-19) 07/30/2019   Hyperkalemia    Hyperlipidemia    Hypertension    Intractable nausea and vomiting 08/08/2015   Nausea & vomiting 05/30/2018   Nausea and vomiting 05/29/2018   Pneumonia due to COVID-19 virus     Past Surgical History:  Procedure Laterality Date   A/V FISTULAGRAM Left 12/20/2016   Procedure: A/V Fistulagram;  Surgeon: Katha Cabal, MD;  Location: [place] [facility];  Service: Cardiovascular;  Laterality: Left;   A/V SHUNT INTERVENTION N/A 12/20/2016   Procedure: A/V Shunt Intervention;  Surgeon: Katha Cabal, MD;  Location: [place] [facility];  Service: Cardiovascular;  Laterality: N/A;   A/V SHUNTOGRAM Left 09/11/2017   Procedure: A/V SHUNTOGRAM;  Surgeon: Algernon Huxley, MD;  Location: [place] [facility];  Service: Cardiovascular;  Laterality: Left;   A/V SHUNTOGRAM Left 11/21/2019   Procedure: A/V SHUNTOGRAM;  Surgeon: Algernon Huxley, MD;  Location: [place] [facility];  Service: Cardiovascular;  Laterality: Left;   BREAST BIOPSY Bilateral 07/19/2000   neg   BREAST BIOPSY Left 11/03/2021   [place] bx mass at 3:00, venus marker, axilla bx-hyrdo marker, path pending   COLONOSCOPY WITH PROPOFOL N/A 02/16/2019   Procedure: COLONOSCOPY WITH PROPOFOL;  Surgeon: Toledo, Benay Pike, MD;  Location: [facility];  Service: Gastroenterology;  Laterality: N/A;   ESOPHAGOGASTRODUODENOSCOPY (EGD) WITH PROPOFOL N/A 02/16/2019   Procedure: ESOPHAGOGASTRODUODENOSCOPY (EGD) WITH PROPOFOL;  Surgeon: Toledo, Benay Pike, MD;  Location: [facility];  Service: Gastroenterology;  Laterality: N/A;   IR US GUIDE BX ASP/DRAIN  03/25/2022   LOOP RECORDER INSERTION N/A 11/16/2017   Procedure: LOOP RECORDER INSERTION;  Surgeon: Deboraha Sprang, MD;  Location: [place] [facility];  Service: Cardiovascular;  Laterality: N/A;   PERIPHERAL VASCULAR CATHETERIZATION Left 02/02/2015   Procedure: A/V Shuntogram/Fistulagram;  Surgeon: Algernon Huxley, MD;  Location: [place] [facility];   Service: Cardiovascular;  Laterality: Left;   PERIPHERAL VASCULAR CATHETERIZATION Left 02/02/2015   Procedure: A/V Shunt Intervention;  Surgeon: Algernon Huxley, MD;  Location: [place] [facility];  Service: Cardiovascular;  Laterality: Left;   PERIPHERAL VASCULAR CATHETERIZATION Left 03/09/2015   Procedure: A/V Shuntogram/Fistulagram;  Surgeon: Algernon Huxley, MD;  Location: [place] [facility];  Service: Cardiovascular;  Laterality: Left;   PERIPHERAL VASCULAR CATHETERIZATION N/A 03/09/2015   Procedure: A/V Shunt Intervention;  Surgeon: Algernon Huxley, MD;  Location: [place] [facility];  Service: Cardiovascular;  Laterality: N/A;   PERIPHERAL VASCULAR CATHETERIZATION Left 04/17/2015   Procedure: Upper Extremity Angiography;  Surgeon: Algernon Huxley, MD;  Location: [place] [facility];  Service: Cardiovascular;  Laterality: Left;   PERIPHERAL VASCULAR CATHETERIZATION  04/17/2015   Procedure: Upper Extremity Intervention;  Surgeon: Algernon Huxley, MD;  Location: [place] [facility];  Service: Cardiovascular;;   PERIPHERAL VASCULAR CATHETERIZATION N/A 08/10/2015   Procedure: A/V Shuntogram/Fistulagram;  Surgeon: Algernon Huxley, MD;  Location: [place] [facility];  Service: Cardiovascular;  Laterality: N/A;   PERIPHERAL VASCULAR CATHETERIZATION  N/A 08/10/2015   Procedure: A/V Shunt Intervention;  Surgeon: Algernon Huxley, MD;  Location: [place] [facility];  Service: Cardiovascular;  Laterality: N/A;   PERIPHERAL VASCULAR CATHETERIZATION Left 04/11/2016   Procedure: A/V Shuntogram/Fistulagram;  Surgeon: Algernon Huxley, MD;  Location: [place] [facility];  Service: Cardiovascular;  Laterality: Left;   TEE WITHOUT CARDIOVERSION N/A 11/16/2017   Procedure: TRANSESOPHAGEAL ECHOCARDIOGRAM (TEE);  Surgeon: Minna Merritts, MD;  Location: [facility];  Service: Cardiovascular;  Laterality: N/A;    Social History:  reports that she has quit smoking. Her smoking use included cigarettes. She has never used smokeless tobacco.  She reports that she does not drink alcohol and does not use drugs.  Family History:  Family History  Problem Relation Age of Onset   Breast cancer Father 60   Stroke Mother    Heart attack Mother    Heart Problems Sister    Breast cancer Cousin 82       1 st cousin. Maternal      Prior to Admission medications   Medication Sig Start Date End Date Taking? Authorizing Provider  apixaban (ELIQUIS) 5 MG TABS tablet Take 1 tablet (5 mg total) by mouth 2 (two) times daily. Only start this script after you have completed eliquis 10 mg BID x 1 day. 04/03/22 05/09/22 Yes Wyvonnia Dusky, MD  aspirin EC 81 MG tablet Take 81 mg by mouth daily. Swallow whole.   Yes [provider]  atorvastatin (LIPITOR) 80 MG tablet Take 80 mg by mouth every evening.    Yes [provider]  cholecalciferol (VITAMIN D) 25 MCG tablet Take 1 tablet (1,000 Units total) by mouth daily. 08/11/19  Yes Ezekiel Slocumb, DO  cinacalcet (SENSIPAR) 30 MG tablet Take 30 mg by mouth daily with breakfast.   Yes [provider]  fluticasone (FLONASE) 50 MCG/ACT nasal spray Place 1 spray into both nostrils daily.   Yes [provider]  Ipratropium-Albuterol (COMBIVENT) 20-100 MCG/ACT AERS respimat Inhale 1 puff into the lungs every 6 (six) hours. Patient taking differently: Inhale 1 puff into the lungs 2 (two) times daily. 08/11/19  Yes Nicole Kindred A, DO  levETIRAcetam (KEPPRA) 1000 MG tablet Take 1 tablet (1,000 mg total) by mouth daily. 03/15/18  Yes Vaughan Basta, MD  levETIRAcetam (KEPPRA) 250 MG tablet Take 250 mg by mouth 3 (three) times a week. 01/16/20  Yes [provider]  losartan (COZAAR) 100 MG tablet Take 100 mg by mouth at bedtime. 02/07/22  Yes [provider]  metoprolol succinate (TOPROL-XL) 100 MG 24 hr tablet Take 100 mg by mouth daily. 05/02/22  Yes [provider]  NOVOLIN 70/30 (70-30) 100 UNIT/ML injection SMARTSIG:28 Unit(s) SUB-Q  Twice Daily 10/12/21  Yes [provider]  omeprazole (PRILOSEC) 20 MG capsule Take 20 mg by mouth 2 (two) times daily.    Yes [provider]  sertraline (ZOLOFT) 25 MG tablet Take 25 mg by mouth daily. 04/20/22  Yes [provider]  torsemide (DEMADEX) 100 MG tablet Take 100 mg by mouth daily. 05/03/22  Yes [provider]  traZODone (DESYREL) 50 MG tablet Take 25 mg by mouth at bedtime. 04/20/22  Yes [provider]  acetaminophen (TYLENOL) 500 MG tablet Take 500 mg by mouth every 6 (six) hours as needed.    [provider]  diphenoxylate-atropine (LOMOTIL) 2.5-0.025 MG tablet SMARTSIG:1 Tablet(s) By Mouth Every 12 Hours PRN 01/06/22   [provider]  glucose 4 GM  chewable tablet Chew 1 tablet by mouth as needed for low blood sugar.    [provider]  lidocaine-prilocaine (EMLA) cream Apply 1 application topically as needed (for port access).     [provider]  loperamide (IMODIUM) 2 MG capsule Take 2 mg by mouth 4 (four) times daily as needed. 03/07/22   [provider]    Physical Exam: Vitals:   05/09/22 1500 05/09/22 1515 05/09/22 1524 05/09/22 1539  BP: (!) 171/79 (!) 153/78 (!) 164/71   Pulse: 69 69 70   Resp: '18 15 20   '$ Temp:  97.7 F (36.5 C)    TempSrc:  Oral    SpO2: 100% 100% 100%   Weight:    113.7 kg  Height:       General: Not in acute distress HEENT:       Eyes: PERRL, EOMI, no scleral icterus.       ENT: No discharge from the ears and nose, no pharynx injection, no tonsillar enlargement.        Neck: No JVD, no bruit, no mass felt. Heme: No neck lymph node enlargement. Cardiac: S1/S2, RRR, No gallops or rubs. Respiratory: No rales, wheezing, rhonchi or rubs. GI: Soft, nondistended, nontender, no rebound pain, no organomegaly, BS present. GU: No hematuria Ext: No pitting leg edema bilaterally. 1+DP/PT pulse bilaterally. Have good DP/PT pulses by portable  Doppler. Musculoskeletal: No joint deformities, No joint redness or warmth. Has tenderness in anterior aspect of right lower leg. Skin: No rashes.  Neuro: Alert, oriented X3, cranial nerves II-XII grossly intact, moves all extremities. Psych: Patient is not psychotic, no suicidal or hemocidal ideation.  Labs on Admission: I have personally reviewed following labs and imaging studies  CBC: Recent Labs  Lab 05/09/22 0054  WBC 10.2  HGB 8.4*  HCT 27.4*  MCV 89.8  PLT 193   Basic Metabolic Panel: Recent Labs  Lab 05/09/22 0054  NA 139  K 4.0  CL 106  CO2 24  GLUCOSE 164*  BUN 42*  CREATININE 9.38*  CALCIUM 8.6*   GFR: Estimated Creatinine Clearance: 7.4 mL/min (A) (by C-G formula based on SCr of 9.38 mg/dL (H)). Liver Function Tests: No results for input(s): "AST", "ALT", "ALKPHOS", "BILITOT", "PROT", "ALBUMIN" in the last 168 hours. No results for input(s): "LIPASE", "AMYLASE" in the last 168 hours. No results for input(s): "AMMONIA" in the last 168 hours. Coagulation Profile: No results for input(s): "INR", "PROTIME" in the last 168 hours. Cardiac Enzymes: Recent Labs  Lab 05/09/22 0054  CKTOTAL 29*   BNP (last 3 results) No results for input(s): "PROBNP" in the last 8760 hours. HbA1C: No results for input(s): "HGBA1C" in the last 72 hours. CBG: No results for input(s): "GLUCAP" in the last 168 hours. Lipid Profile: No results for input(s): "CHOL", "HDL", "LDLCALC", "TRIG", "CHOLHDL", "LDLDIRECT" in the last 72 hours. Thyroid Function Tests: No results for input(s): "TSH", "T4TOTAL", "FREET4", "T3FREE", "THYROIDAB" in the last 72 hours. Anemia Panel: No results for input(s): "VITAMINB12", "FOLATE", "FERRITIN", "TIBC", "IRON", "RETICCTPCT" in the last 72 hours. Urine analysis:    Component Value Date/Time   COLORURINE YELLOW (A) 03/20/2022 0125   APPEARANCEUR CLEAR (A) 03/20/2022 0125   APPEARANCEUR Clear 06/14/2014 1510   LABSPEC 1.010 03/20/2022 0125    LABSPEC 1.006 06/14/2014 1510   PHURINE 7.0 03/20/2022 0125   GLUCOSEU 50 (A) 03/20/2022 0125   GLUCOSEU >=500 06/14/2014 1510   HGBUR MODERATE (A) 03/20/2022 0125   BILIRUBINUR NEGATIVE 03/20/2022 0125  BILIRUBINUR Negative 06/14/2014 1510   KETONESUR NEGATIVE 03/20/2022 0125   PROTEINUR 100 (A) 03/20/2022 0125   NITRITE NEGATIVE 03/20/2022 0125   LEUKOCYTESUR NEGATIVE 03/20/2022 0125   LEUKOCYTESUR Negative 06/14/2014 1510   Sepsis Labs: '@LABRCNTIP'$ (procalcitonin:4,lacticidven:4) )No results found for this or any previous visit (from the past 240 hour(s)).   Radiological Exams on Admission: DG Tibia/Fibula Right  Result Date: 05/09/2022 CLINICAL DATA:  Right lower leg pain. EXAM: RIGHT TIBIA AND FIBULA - 2 VIEW COMPARISON:  None Available. FINDINGS: No fracture, dislocation, bony lesion or bony destruction identified. Extensive popliteal and tibial arterial vascular calcifications noted. IMPRESSION: No acute findings in the right lower leg. Extensive arterial vascular calcifications. Electronically Signed   By: Aletta Edouard M.D.   On: 05/09/2022 16:35   DG Chest 1 View  Result Date: 05/09/2022 CLINICAL DATA:  Weakness EXAM: CHEST  1 VIEW COMPARISON:  03/19/2022 FINDINGS: Right Port-A-Cath remains in place, unchanged. Cardiomegaly, aortic atherosclerosis. Vascular congestion. No confluent opacities, effusions or overt edema. No acute bony abnormality. IMPRESSION: Cardiomegaly, vascular congestion Electronically Signed   By: Rolm Baptise M.D.   On: 05/09/2022 03:35   DG Hip Unilat With Pelvis 2-3 Views Right  Result Date: 05/09/2022 CLINICAL DATA:  Pain for 3 months EXAM: DG HIP (WITH OR WITHOUT PELVIS) 2-3V RIGHT COMPARISON:  None Available. FINDINGS: Hip joints and SI joints symmetric. No acute bony abnormality. Specifically, no fracture, subluxation, or dislocation. IMPRESSION: No acute bony abnormality. Electronically Signed   By: Rolm Baptise M.D.   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  Etiology is not clear.  X-ray of right leg is negative for bony fracture.  Patient had negative venous Doppler for DVT.  I also did portable Doppler, which showed good pulses of DP/PT.  Other differential diagnosis include peripheral neuropathy, side effects of Lipitor, but patient has unilateral leg pain.  -Admitted to telemetry bed as inpatient -PT/OT -Pain control: As needed fentanyl, Tylenol, Lidoderm -will hold lipitor -Start Neurontin 300 mg twice daily -fall precaution   Right leg pain -see above  ESRD on dialysis [facility]) -consulted Dr. Theador Hawthorne of renal for HD  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-stage renal disease) ([place]) Hemoglobin stable 8.4 (7.8 on 04/11/2022) -Follow-up with CBC  CAD (coronary artery disease) No chest pain -Hold Lipitor as above -Continue aspirin  Chronic diastolic CHF (congestive heart failure) ([place]) Patient does not have leg edema.  CHF is compensated.  2D echo 03/22/2022 showed EF of 50 to 55% with grade 2 diastolic dysfunction. 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  8, well controlled.  Patient's not taking 70/30 insulin currently. -Sliding scale insulin  Obesity with body mass index (BMI) of 30.0 to 39.9  BMI= 37.02,   and BW= 113.7Kg -Diet and exercise.   -Encourage to lose weight.   Diarrhea - Check C. difficile PCR          DVT ppx: on Eliquis  Code Status: Full code  Family Communication:   Yes, patient's fianc at bed side.     Disposition Plan:  Anticipate discharge back to previous environment  Consults called: Dr. Theador Hawthorne of nephrology  Admission status and Level of care: Telemetry Medical:   as inpt       Dispo: The patient is from: Home              Anticipated d/c is to: Home              Anticipated d/c date is: 2 days              Patient currently is not medically stable to d/c.    Severity of Illness:  The appropriate patient status for this patient is INPATIENT. Inpatient status is judged to be reasonable and necessary in order to provide the required intensity of service to ensure the patient's safety. The patient's presenting symptoms, physical exam findings, and initial radiographic and laboratory data in the context of their chronic comorbidities is felt to place them at high risk for further clinical deterioration. Furthermore, it is not anticipated that the patient will be medically stable for discharge from the hospital within 2 midnights of admission.   * I certify that at the point of admission it is my clinical judgment that the patient will require inpatient hospital care spanning beyond 2 midnights from the point of admission due to high intensity of service, high risk  for further deterioration and high frequency of surveillance required.*       Date of Service 05/09/2022    Ivor Costa [facility]   If 7PM-7AM, please contact night-coverage [email or web address] 05/09/2022, 7:06 PM

## 2022-05-09 NOTE — Assessment & Plan Note (Signed)
Hemoglobin stable 8.4 (7.8 on 04/11/2022) -Follow-up with CBC

## 2022-05-09 NOTE — Assessment & Plan Note (Signed)
-   Check C. difficile PCR 

## 2022-05-09 NOTE — Assessment & Plan Note (Signed)
  BMI= 37.02,   and BW= 113.7Kg -Diet and exercise.   -Encourage to lose weight.

## 2022-05-09 NOTE — Progress Notes (Addendum)
[facility]  ROUNDING NOTE   Subjective:   Cynthia Dean is a 71 y.o. 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 Patient presents to ED with complaints of right leg pain and weakness. She has been admitted for Unable to ambulate [R26.2]  Patient is known to our practice and receives outpatient dialysis treatments at [facility] on a MWF schedule, supervised by Dr Candiss Norse. Her last dialysis treatment was Friday and she denies missing any recent treatments. Patient reports pain in right leg began last week after receiving an MRI. She states her leg "squeezed" and she has had pain since then. Denies recent illness, nausea, vomiting or diarrhea. Denies shortness of breath or chest pain.   Labs on ED arrival unremarkable for renal patient. CK 29. Chest x-ray shows vascular congestion. Hip X ray negative for fracture.   We have been consulted to manage dialysis needs.    Objective:  Vital signs in last 24 hours:  Temp:  [97.7 F (36.5 C)-98.3 F (36.8 C)] 98.3 F (36.8 C) (10/16 0700) Pulse Rate:  [58-71] 58 (10/16 0700) Resp:  [18-20] 20 (10/16 0700) BP: (154-157)/(65-79) 154/72 (10/16 0700) SpO2:  [96 %-98 %] 96 % (10/16 0700) Weight:  [115.7 kg] 115.7 kg (10/15 2104)  Weight change:  Filed Weights   05/08/22 2104  Weight: 115.7 kg    Intake/Output: No intake/output data recorded.   Intake/Output this shift:  No intake/output data recorded.  Physical Exam: General: NAD  Head: Normocephalic, atraumatic. Moist oral mucosal membranes  Eyes: Anicteric  Lungs:  Diminished in bases, normal effort , [place] O2  Heart: Regular rate and rhythm  Abdomen:  Soft, nontender, obese  Extremities:  No peripheral edema.  Neurologic: Nonfocal, moving all four extremities  Skin: No lesions  Access: Lt upper AVG    Basic Metabolic  Panel: Recent Labs  Lab 05/09/22 0054  NA 139  K 4.0  CL 106  CO2 24  GLUCOSE 164*  BUN 42*  CREATININE 9.38*  CALCIUM 8.6*    Liver Function Tests: No results for input(s): "AST", "ALT", "ALKPHOS", "BILITOT", "PROT", "ALBUMIN" in the last 168 hours. No results for input(s): "LIPASE", "AMYLASE" in the last 168 hours. No results for input(s): "AMMONIA" in the last 168 hours.  CBC: Recent Labs  Lab 05/09/22 0054  WBC 10.2  HGB 8.4*  HCT 27.4*  MCV 89.8  PLT 254    Cardiac Enzymes: Recent Labs  Lab 05/09/22 0054  CKTOTAL 29*    BNP: Invalid input(s): "POCBNP"  CBG: No results for input(s): "GLUCAP" in the last 168 hours.  Microbiology: Results for orders placed or performed during the hospital encounter of 03/19/22  Resp Panel by RT-PCR (Flu A&B, Covid) Anterior Nasal Swab     Status: None   Collection Time: 03/19/22  9:28 PM   Specimen: Anterior Nasal Swab  Result Value Ref Range Status   SARS Coronavirus 2 by RT PCR NEGATIVE NEGATIVE Final    Comment: (NOTE) SARS-CoV-2 target nucleic acids are NOT DETECTED.  The SARS-CoV-2 RNA is generally detectable in upper respiratory specimens during the acute phase of infection. The lowest concentration of SARS-CoV-2 viral copies this assay can detect is 138 copies/mL. A negative result does not preclude SARS-Cov-2 infection and should not be used as the sole basis for treatment or other patient management decisions. A negative result may occur with  improper specimen collection/handling,  submission of specimen other than nasopharyngeal swab, presence of viral mutation(s) within the areas targeted by this assay, and inadequate number of viral copies(<138 copies/mL). A negative result must be combined with clinical observations, patient history, and epidemiological information. The expected result is Negative.  Fact Sheet for Patients:  EntrepreneurPulse.com.au  Fact Sheet for Healthcare  Providers:  IncredibleEmployment.be  This test is no t yet approved or cleared by the [place] FDA and  has been authorized for detection and/or diagnosis of SARS-CoV-2 by FDA under an Emergency Use Authorization (EUA). This EUA will remain  in effect (meaning this test can be used) for the duration of the COVID-19 declaration under Section 564(b)(1) of the Act, 21 U.S.C.section 360bbb-3(b)(1), unless the authorization is terminated  or revoked sooner.       Influenza A by PCR NEGATIVE NEGATIVE Final   Influenza B by PCR NEGATIVE NEGATIVE Final    Comment: (NOTE) The Xpert Xpress SARS-CoV-2/FLU/RSV plus assay is intended as an aid in the diagnosis of influenza from Nasopharyngeal swab specimens and should not be used as a sole basis for treatment. Nasal washings and aspirates are unacceptable for Xpert Xpress SARS-CoV-2/FLU/RSV testing.  Fact Sheet for Patients: EntrepreneurPulse.com.au  Fact Sheet for Healthcare Providers: IncredibleEmployment.be  This test is not yet approved or cleared by the [place] FDA and has been authorized for detection and/or diagnosis of SARS-CoV-2 by FDA under an Emergency Use Authorization (EUA). This EUA will remain in effect (meaning this test can be used) for the duration of the COVID-19 declaration under Section 564(b)(1) of the Act, 21 U.S.C. section 360bbb-3(b)(1), unless the authorization is terminated or revoked.  Performed at [facility], [address] [place], [place] [address]   Blood Culture (routine x 2)     Status: Abnormal   Collection Time: 03/19/22  9:28 PM   Specimen: BLOOD  Result Value Ref Range Status   Specimen Description   Final    BLOOD RIGHT ANTECUBITAL Performed at [facility], [address] [place], [place] [address]    Special Requests   Final    BOTTLES DRAWN AEROBIC AND ANAEROBIC Blood Culture adequate volume Performed  at [facility], [address] [place], [place] [address]    Culture  Setup Time   Final    GRAM POSITIVE COCCI IN BOTH AEROBIC AND ANAEROBIC BOTTLES CRITICAL RESULT CALLED TO, READ BACK BY AND VERIFIED WITH: CAROLYN CHILDS 03/20/22 1457 AMK Performed at [facility], [address] [place], [place] [address]    Culture STAPHYLOCOCCUS CAPITIS (A)  Final   Report Status 03/23/2022 FINAL  Final   Organism ID, Bacteria STAPHYLOCOCCUS CAPITIS  Final      Susceptibility   Staphylococcus capitis - MIC*    CIPROFLOXACIN <=0.5 SENSITIVE Sensitive     ERYTHROMYCIN <=0.25 SENSITIVE Sensitive     GENTAMICIN <=0.5 SENSITIVE Sensitive     OXACILLIN <=0.25 SENSITIVE Sensitive     TETRACYCLINE <=1 SENSITIVE Sensitive     VANCOMYCIN <=0.5 SENSITIVE Sensitive     TRIMETH/SULFA <=10 SENSITIVE Sensitive     CLINDAMYCIN <=0.25 SENSITIVE Sensitive     RIFAMPIN <=0.5 SENSITIVE Sensitive     Inducible Clindamycin NEGATIVE Sensitive     * STAPHYLOCOCCUS CAPITIS  Blood Culture ID Panel (Reflexed)     Status: Abnormal   Collection Time: 03/19/22  9:28 PM  Result Value Ref Range Status   Enterococcus faecalis NOT DETECTED NOT DETECTED Final   Enterococcus Faecium NOT DETECTED NOT DETECTED  Final   Listeria monocytogenes NOT DETECTED NOT DETECTED Final   Staphylococcus species DETECTED (A) NOT DETECTED Final    Comment: CRITICAL RESULT CALLED TO, READ BACK BY AND VERIFIED WITH: CAROLYN CHILDS 03/20/22 1457 AMK    Staphylococcus aureus (BCID) NOT DETECTED NOT DETECTED Final   Staphylococcus epidermidis NOT DETECTED NOT DETECTED Final   Staphylococcus lugdunensis NOT DETECTED NOT DETECTED Final   Streptococcus species NOT DETECTED NOT DETECTED Final   Streptococcus agalactiae NOT DETECTED NOT DETECTED Final   Streptococcus pneumoniae NOT DETECTED NOT DETECTED Final   Streptococcus pyogenes NOT DETECTED NOT DETECTED Final   A.calcoaceticus-baumannii NOT DETECTED NOT DETECTED Final    Bacteroides fragilis NOT DETECTED NOT DETECTED Final   Enterobacterales NOT DETECTED NOT DETECTED Final   Enterobacter cloacae complex NOT DETECTED NOT DETECTED Final   Escherichia coli NOT DETECTED NOT DETECTED Final   Klebsiella aerogenes NOT DETECTED NOT DETECTED Final   Klebsiella oxytoca NOT DETECTED NOT DETECTED Final   Klebsiella pneumoniae NOT DETECTED NOT DETECTED Final   Proteus species NOT DETECTED NOT DETECTED Final   Salmonella species NOT DETECTED NOT DETECTED Final   Serratia marcescens NOT DETECTED NOT DETECTED Final   Haemophilus influenzae NOT DETECTED NOT DETECTED Final   Neisseria meningitidis NOT DETECTED NOT DETECTED Final   Pseudomonas aeruginosa NOT DETECTED NOT DETECTED Final   Stenotrophomonas maltophilia NOT DETECTED NOT DETECTED Final   Candida albicans NOT DETECTED NOT DETECTED Final   Candida auris NOT DETECTED NOT DETECTED Final   Candida glabrata NOT DETECTED NOT DETECTED Final   Candida krusei NOT DETECTED NOT DETECTED Final   Candida parapsilosis NOT DETECTED NOT DETECTED Final   Candida tropicalis NOT DETECTED NOT DETECTED Final   Cryptococcus neoformans/gattii NOT DETECTED NOT DETECTED Final    Comment: Performed at [facility], [address] [place], [place] [address]  Blood Culture (routine x 2)     Status: Abnormal   Collection Time: 03/19/22 10:23 PM   Specimen: BLOOD  Result Value Ref Range Status   Specimen Description   Final    BLOOD BLOOD RIGHT FOREARM Performed at [facility], [address], [place], [place] [address]    Special Requests   Final    BOTTLES DRAWN AEROBIC AND ANAEROBIC Blood Culture adequate volume Performed at [facility], [address] [place], [place] [address]    Culture  Setup Time   Final    GRAM POSITIVE COCCI IN BOTH AEROBIC AND ANAEROBIC BOTTLES CRITICAL RESULT CALLED TO, READ BACK BY AND VERIFIED WITH: ELVERA MADUEME 03/20/22 1545 AMK Performed at [facility],  [address] [place], [place] [address]    Culture (A)  Final    STAPHYLOCOCCUS CAPITIS SUSCEPTIBILITIES PERFORMED ON PREVIOUS CULTURE WITHIN THE LAST 5 DAYS. Performed at [facility], [address], [place], [place] [address]    Report Status 03/22/2022 FINAL  Final  Urine Culture     Status: Abnormal   Collection Time: 03/20/22  1:25 AM   Specimen: In/Out Cath Urine  Result Value Ref Range Status   Specimen Description   Final    IN/OUT CATH URINE Performed at [facility], [address], [place], [place] [address]    Special Requests   Final    NONE Performed at [facility], [place], [place] [address]    Culture 4,000 COLONIES/mL STAPHYLOCOCCUS EPIDERMIDIS (A)  Final   Report Status 03/22/2022 FINAL  Final   Organism ID, Bacteria  STAPHYLOCOCCUS EPIDERMIDIS (A)  Final      Susceptibility   Staphylococcus epidermidis - MIC*    CIPROFLOXACIN <=0.5 SENSITIVE Sensitive     GENTAMICIN <=0.5 SENSITIVE Sensitive     NITROFURANTOIN <=16 SENSITIVE Sensitive     OXACILLIN <=0.25 SENSITIVE Sensitive     TETRACYCLINE <=1 SENSITIVE Sensitive     VANCOMYCIN 2 SENSITIVE Sensitive     TRIMETH/SULFA 80 RESISTANT Resistant     CLINDAMYCIN >=8 RESISTANT Resistant     RIFAMPIN <=0.5 SENSITIVE Sensitive     Inducible Clindamycin NEGATIVE Sensitive     * 4,000 COLONIES/mL STAPHYLOCOCCUS EPIDERMIDIS  Culture, blood (Routine X 2) w Reflex to ID Panel     Status: None   Collection Time: 03/21/22 11:00 AM   Specimen: BLOOD  Result Value Ref Range Status   Specimen Description BLOOD BLOOD RIGHT FOREARM  Final   Special Requests   Final    BOTTLES DRAWN AEROBIC AND ANAEROBIC Blood Culture adequate volume   Culture   Final    NO GROWTH 5 DAYS Performed at [facility], [address] [place], [place] [address]    Report Status 03/26/2022 FINAL  Final  Culture, blood (Routine X 2) w Reflex to ID Panel     Status: None   Collection  Time: 03/22/22 11:53 AM   Specimen: BLOOD RIGHT HAND  Result Value Ref Range Status   Specimen Description BLOOD RIGHT HAND  Final   Special Requests   Final    BOTTLES DRAWN AEROBIC AND ANAEROBIC Blood Culture adequate volume   Culture   Final    NO GROWTH 5 DAYS Performed at [facility], [address], [place], [place] [address]    Report Status 03/27/2022 FINAL  Final  Aerobic/Anaerobic Culture w Gram Stain (surgical/deep wound)     Status: None   Collection Time: 03/25/22 10:30 AM   Specimen: Wound; Blood  Result Value Ref Range Status   Specimen Description WOUND LEFT BREAST  Final   Special Requests BLOOD HEMATOMA  Final   Gram Stain   Final    RARE WBC PRESENT, PREDOMINANTLY MONONUCLEAR NO ORGANISMS SEEN    Culture   Final    No growth aerobically or anaerobically. Performed at [facility], [place] [address] [place],  [address]    Report Status 03/30/2022 FINAL  Final    Coagulation Studies: No results for input(s): "LABPROT", "INR" in the last 72 hours.  Urinalysis: No results for input(s): "COLORURINE", "LABSPEC", "PHURINE", "GLUCOSEU", "HGBUR", "BILIRUBINUR", "KETONESUR", "PROTEINUR", "UROBILINOGEN", "NITRITE", "LEUKOCYTESUR" in the last 72 hours.  Invalid input(s): "APPERANCEUR"    Imaging: DG Chest 1 View  Result Date: 05/09/2022 CLINICAL DATA:  Weakness EXAM: CHEST  1 VIEW COMPARISON:  03/19/2022 FINDINGS: Right Port-A-Cath remains in place, unchanged. Cardiomegaly, aortic atherosclerosis. Vascular congestion. No confluent opacities, effusions or overt edema. No acute bony abnormality. IMPRESSION: Cardiomegaly, vascular congestion Electronically Signed   By: Rolm Baptise M.D.   On: 05/09/2022 03:35   DG Hip Unilat With Pelvis 2-3 Views Right  Result Date: 05/09/2022 CLINICAL DATA:  Pain for 3 months EXAM: DG HIP (WITH OR WITHOUT PELVIS) 2-3V RIGHT COMPARISON:  None Available. FINDINGS: Hip joints and SI joints symmetric. No acute bony  abnormality. Specifically, no fracture, subluxation, or dislocation. IMPRESSION: No acute bony abnormality. Electronically Signed   By: Rolm Baptise M.D.   On: 05/09/2022 03:34     Medications:    anticoagulant sodium citrate      Chlorhexidine  Gluconate Cloth  6 each Topical Q0600   insulin aspart  0-5 Units Subcutaneous QHS   insulin aspart  0-9 Units Subcutaneous TID WC   levETIRAcetam  250 mg Oral Q M,W,F-1800   lidocaine  1 patch Transdermal Q24H   albuterol, alteplase, anticoagulant sodium citrate, dextromethorphan-guaiFENesin, fentaNYL (SUBLIMAZE) injection, heparin, hydrALAZINE, lidocaine (PF), lidocaine-prilocaine, LORazepam, ondansetron (ZOFRAN) IV, pentafluoroprop-tetrafluoroeth  Assessment/ Plan:  Ms. Charlii Yost is a 71 y.o.  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 Patient presents to ED with complaints of right leg pain and weakness. She has been admitted for Unable to ambulate [R26.2]  [facility]/MWF/Lt AVG  End stage renal disease on hemodialysis. Last treatment received on Friday. Will schedule patient for routine dialysis today, UF 2L as tolerated. Next treatment scheduled for Wednesday.  2. Anemia of chronic kidney disease, Normocytic Lab Results  Component Value Date   HGB 8.4 (L) 05/09/2022    Hgb below target. Patient receives [place] outpatient. Will continue to monitor and assess need for ESA.   3. Secondary Hyperparathyroidism: with outpatient labs: PTH 613, phosphorus 4.8, calcium 9.0 on 03/07/22.    Lab Results  Component Value Date   PTH 131 (H) 03/14/2018   CALCIUM 8.6 (L) 05/09/2022   PHOS 3.5 03/30/2022    Cinacalcet ordered outpatient. Calcium and phosphorus within desired range.   4. Hypertension with chronic kidney disease. Home regimen includes losartan, metoprolol, and torsemide. All  held  5. Diabetes mellitus type II with chronic kidney disease: insulin dependent. Home regimen includes Novolin. Most recent hemoglobin A1c is 5.8 on 03/23/22.      LOS: 0 Shantelle Breeze 10/16/202310:25 AM     I saw and evaluated the patient with Colon Flattery, NP.   Meda Coffee personally formulated the plan of care.   I agree with the findings and plan as documented in the note above.

## 2022-05-09 NOTE — ED Notes (Signed)
Assisted pt back into bed. Pt had bowel movement. Pt cleansed- new brief, purewick and chux placed. Bedside commode emptied at this time. No other needs voiced, pts husband and daughter at bedside.

## 2022-05-09 NOTE — Progress Notes (Signed)
Pt completed 3.5 hour HD treatment w/ no complications. Alert, vss, no c/o, report to ED RN. Start: 1145 End: 1515 1069m fluid removed 84L BVP No HD meds ordered 113.7 Kg post ED stretcher weight

## 2022-05-09 NOTE — Assessment & Plan Note (Addendum)
-   IV hydralazine as needed -Cozaar, metoprolol

## 2022-05-09 NOTE — Assessment & Plan Note (Signed)
Stable -Bronchodilators 

## 2022-05-09 NOTE — Assessment & Plan Note (Signed)
-   Continue home medications 

## 2022-05-09 NOTE — Progress Notes (Signed)
Pre HD RN assessment 

## 2022-05-09 NOTE — Assessment & Plan Note (Addendum)
unable to ambulate in due to right leg pain: Patient has severe pain in anterior right lower leg.  Etiology is not clear.  X-ray of right leg is negative for bony fracture.  Patient had negative venous Doppler for DVT.  I also did portable Doppler, which showed good pulses of DP/PT.  Other differential diagnosis include peripheral neuropathy, side effects of Lipitor, but patient has unilateral leg pain.  -Admitted to telemetry bed as inpatient -PT/OT -Pain control: As needed fentanyl, Tylenol, Lidoderm -will hold lipitor -Start Neurontin 300 mg twice daily -fall precaution

## 2022-05-09 NOTE — Assessment & Plan Note (Signed)
Patient does not have leg edema.  CHF is compensated.  2D echo 03/22/2022 showed EF of 50 to 55% with grade 2 diastolic dysfunction. -Volume management per renal by dialysis

## 2022-05-09 NOTE — ED Provider Notes (Signed)
[facility] Provider Note    Event Date/Time   First MD Initiated Contact with Patient 05/09/22 [phone]     (approximate)   History   Right leg pain   HPI  Cynthia Dean is a 71 y.o. female brought to the ED via EMS from home with a chief complaint of right leg pain.  States she has been unable to walk secondary to pain since "they squeeze my leg roll hard when I have the MRI".  Patient states she had edema to her right lower leg which prompted MRI last Friday.  Denies fever, cough, chest pain, shortness of breath, abdominal pain, nausea, vomiting or dizziness.  Denies recent fall or injury.  Reports right leg pain x3 months.  History of ESRD on HD M/W/F.     Past Medical History   Past Medical History:  Diagnosis Date   Acute on chronic respiratory failure with hypoxia ([place]) 07/30/2019   Acute respiratory failure with hypoxia ([place]) 12/31/2018   Carotid arterial disease ([place])    a. 02/2018 < bilat ICA stenosis.   COPD (chronic obstructive pulmonary disease) (HCC)    Coronary artery disease    a. 11/2015 MV: EF 57%, no ischemia/infarct.   Cryptogenic stroke ([place])    a. 10/2017 s/p implantable loop recorder (no Afib to date).   Diabetes mellitus without complication ([place])    Diarrhea    ESRD on dialysis [facility])    GI bleed    a. 01/2019 EGD/Colonoscopy: Mild gastritis. Colitis.   Heart murmur    a. 12/2018 Echo: EF 55-60%, impaired relaxation. Mod dil LA. Mildly dil RA. Mild Ca2+ of AoV.   Hematemesis 06/27/2017   History of 2019 novel coronavirus disease (COVID-19) 07/30/2019   Hyperkalemia    Hyperlipidemia    Hypertension    Intractable nausea and vomiting 08/08/2015   Nausea & vomiting 05/30/2018   Nausea and vomiting 05/29/2018   Pneumonia due to COVID-19 virus      Active Problem List   Patient Active Problem List   Diagnosis Date Noted   Unable to ambulate 05/09/2022   Seroma of breast    Bacteremia due to coagulase-negative  Staphylococcus    Left breast abscess    Urinary tract infection 03/22/2022   Anemia of chronic disease 03/22/2022   Acute pulmonary embolism without acute cor pulmonale ([place]) 03/20/2022   Dyslipidemia 03/20/2022   Diabetic neuropathy ([place]) 03/20/2022   Sepsis due to cellulitis ([place]) 03/20/2022   Delay kidney tx func d/t fluid overload requiring acute dialysis ([place]) 01/29/2020   Delay kidney tx func d/t ATN and fluid overload require acute dialysis ([place]) 01/29/2020   Hypoglycemia 10/21/2019   Unresponsiveness 10/21/2019   GERD (gastroesophageal reflux disease) 10/21/2019   Hypothermia    Sepsis with encephalopathy without septic shock (HCC)    Acute metabolic encephalopathy    Acute on chronic diastolic CHF (congestive heart failure) ([place])    Acute on chronic respiratory failure with hypoxia ([place]) 07/30/2019   Rectal bleed 02/13/2019   Acute respiratory failure with hypoxia ([place]) 12/31/2018   Aphasia as late effect of cerebrovascular accident (CVA) 05/01/2018   Seizure ([place]) 03/14/2018   Stroke ([place]) 03/07/2018   Slurred speech 11/14/2017   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[place]) 11/12/2017   CAD (coronary artery disease) 11/12/2017   COPD (chronic obstructive pulmonary disease) ([place]) 11/12/2017   ESRD on hemodialysis ([place]) 11/12/2017   CVA (cerebral vascular accident) ([place]) 04/14/2016   Essential  hypertension 11/27/2015   Hyperlipidemia 11/27/2015   Prolonged Q-T interval on ECG 98/33/8250   Aphasia complicating stroke 53/97/6734   Cervical spine arthritis 07/27/2015   Diabetic retinopathy without macular edema associated with type 2 diabetes mellitus ([place]) 07/27/2015   Osteoarthritis of spine with radiculopathy, lumbar region 07/27/2015   Obesity, Class III, BMI 40-49.9 (morbid obesity) ([place]) 07/27/2015   Vitamin D deficiency 19/37/9024   Diastolic heart failure ([place]) 10/27/2010     Past Surgical History   Past Surgical History:  Procedure  Laterality Date   A/V FISTULAGRAM Left 12/20/2016   Procedure: A/V Fistulagram;  Surgeon: Katha Cabal, MD;  Location: [place] [facility];  Service: Cardiovascular;  Laterality: Left;   A/V SHUNT INTERVENTION N/A 12/20/2016   Procedure: A/V Shunt Intervention;  Surgeon: Katha Cabal, MD;  Location: [place] [facility];  Service: Cardiovascular;  Laterality: N/A;   A/V SHUNTOGRAM Left 09/11/2017   Procedure: A/V SHUNTOGRAM;  Surgeon: Algernon Huxley, MD;  Location: [place] [facility];  Service: Cardiovascular;  Laterality: Left;   A/V SHUNTOGRAM Left 11/21/2019   Procedure: A/V SHUNTOGRAM;  Surgeon: Algernon Huxley, MD;  Location: [place] [facility];  Service: Cardiovascular;  Laterality: Left;   BREAST BIOPSY Bilateral 07/19/2000   neg   BREAST BIOPSY Left 11/03/2021   [place] bx mass at 3:00, venus marker, axilla bx-hyrdo marker, path pending   COLONOSCOPY WITH PROPOFOL N/A 02/16/2019   Procedure: COLONOSCOPY WITH PROPOFOL;  Surgeon: Toledo, Benay Pike, MD;  Location: [facility];  Service: Gastroenterology;  Laterality: N/A;   ESOPHAGOGASTRODUODENOSCOPY (EGD) WITH PROPOFOL N/A 02/16/2019   Procedure: ESOPHAGOGASTRODUODENOSCOPY (EGD) WITH PROPOFOL;  Surgeon: Toledo, Benay Pike, MD;  Location: [facility];  Service: Gastroenterology;  Laterality: N/A;   IR US GUIDE BX ASP/DRAIN  03/25/2022   LOOP RECORDER INSERTION N/A 11/16/2017   Procedure: LOOP RECORDER INSERTION;  Surgeon: Deboraha Sprang, MD;  Location: [place] [facility];  Service: Cardiovascular;  Laterality: N/A;   PERIPHERAL VASCULAR CATHETERIZATION Left 02/02/2015   Procedure: A/V Shuntogram/Fistulagram;  Surgeon: Algernon Huxley, MD;  Location: [place] [facility];  Service: Cardiovascular;  Laterality: Left;   PERIPHERAL VASCULAR CATHETERIZATION Left 02/02/2015   Procedure: A/V Shunt Intervention;  Surgeon: Algernon Huxley, MD;  Location: [place] [facility];  Service: Cardiovascular;  Laterality: Left;   PERIPHERAL VASCULAR  CATHETERIZATION Left 03/09/2015   Procedure: A/V Shuntogram/Fistulagram;  Surgeon: Algernon Huxley, MD;  Location: [place] [facility];  Service: Cardiovascular;  Laterality: Left;   PERIPHERAL VASCULAR CATHETERIZATION N/A 03/09/2015   Procedure: A/V Shunt Intervention;  Surgeon: Algernon Huxley, MD;  Location: [place] [facility];  Service: Cardiovascular;  Laterality: N/A;   PERIPHERAL VASCULAR CATHETERIZATION Left 04/17/2015   Procedure: Upper Extremity Angiography;  Surgeon: Algernon Huxley, MD;  Location: [place] [facility];  Service: Cardiovascular;  Laterality: Left;   PERIPHERAL VASCULAR CATHETERIZATION  04/17/2015   Procedure: Upper Extremity Intervention;  Surgeon: Algernon Huxley, MD;  Location: [place] [facility];  Service: Cardiovascular;;   PERIPHERAL VASCULAR CATHETERIZATION N/A 08/10/2015   Procedure: A/V Shuntogram/Fistulagram;  Surgeon: Algernon Huxley, MD;  Location: [place] [facility];  Service: Cardiovascular;  Laterality: N/A;   PERIPHERAL VASCULAR CATHETERIZATION N/A 08/10/2015   Procedure: A/V Shunt Intervention;  Surgeon: Algernon Huxley, MD;  Location: [place] [facility];  Service: Cardiovascular;  Laterality: N/A;   PERIPHERAL VASCULAR CATHETERIZATION Left 04/11/2016   Procedure: A/V Shuntogram/Fistulagram;  Surgeon: Algernon Huxley, MD;  Location: [place] [facility];  Service: Cardiovascular;  Laterality: Left;   TEE WITHOUT CARDIOVERSION N/A 11/16/2017   Procedure: TRANSESOPHAGEAL ECHOCARDIOGRAM (TEE);  Surgeon: Minna Merritts, MD;  Location: [facility];  Service: Cardiovascular;  Laterality: N/A;     Home Medications   Prior to Admission medications   Medication Sig Start Date End Date Taking? Authorizing Provider  apixaban (ELIQUIS) 5 MG TABS tablet Take 1 tablet (5 mg total) by mouth 2 (two) times daily. Only start this script after you have completed eliquis 10 mg BID x 1 day. 04/03/22 05/09/22 Yes Wyvonnia Dusky, MD  aspirin EC 81 MG tablet Take 81 mg by mouth daily.  Swallow whole.   Yes [provider]  atorvastatin (LIPITOR) 80 MG tablet Take 80 mg by mouth every evening.    Yes [provider]  cholecalciferol (VITAMIN D) 25 MCG tablet Take 1 tablet (1,000 Units total) by mouth daily. 08/11/19  Yes Ezekiel Slocumb, DO  cinacalcet (SENSIPAR) 30 MG tablet Take 30 mg by mouth daily with breakfast.   Yes [provider]  fluticasone (FLONASE) 50 MCG/ACT nasal spray Place 1 spray into both nostrils daily.   Yes [provider]  Ipratropium-Albuterol (COMBIVENT) 20-100 MCG/ACT AERS respimat Inhale 1 puff into the lungs every 6 (six) hours. Patient taking differently: Inhale 1 puff into the lungs 2 (two) times daily. 08/11/19  Yes Nicole Kindred A, DO  levETIRAcetam (KEPPRA) 1000 MG tablet Take 1 tablet (1,000 mg total) by mouth daily. 03/15/18  Yes Vaughan Basta, MD  levETIRAcetam (KEPPRA) 250 MG tablet Take 250 mg by mouth 3 (three) times a week. 01/16/20  Yes [provider]  losartan (COZAAR) 100 MG tablet Take 100 mg by mouth at bedtime. 02/07/22  Yes [provider]  metoprolol succinate (TOPROL-XL) 100 MG 24 hr tablet Take 100 mg by mouth daily. 05/02/22  Yes [provider]  NOVOLIN 70/30 (70-30) 100 UNIT/ML injection SMARTSIG:28 Unit(s) SUB-Q Twice Daily 10/12/21  Yes [provider]  omeprazole (PRILOSEC) 20 MG capsule Take 20 mg by mouth 2 (two) times daily.    Yes [provider]  sertraline (ZOLOFT) 25 MG tablet Take 25 mg by mouth daily. 04/20/22  Yes [provider]  torsemide (DEMADEX) 100 MG tablet Take 100 mg by mouth daily. 05/03/22  Yes [provider]  traZODone (DESYREL) 50 MG tablet Take 25 mg by mouth at bedtime. 04/20/22  Yes [provider]  acetaminophen (TYLENOL) 500 MG tablet Take 500 mg by mouth every 6 (six) hours as needed.    [provider]  diphenoxylate-atropine (LOMOTIL) 2.5-0.025 MG tablet SMARTSIG:1 Tablet(s)  By Mouth Every 12 Hours PRN 01/06/22   [provider]  glucose 4 GM chewable tablet Chew 1 tablet by mouth as needed for low blood sugar.    [provider]  lidocaine-prilocaine (EMLA) cream Apply 1 application topically as needed (for port access).     [provider]  loperamide (IMODIUM) 2 MG capsule Take 2 mg by mouth 4 (four) times daily as needed. 03/07/22   [provider]     Allergies  Oxycodone, Oxycodone-acetaminophen, Wound dressing adhesive, Hydrocodone, Tape, and Tapentadol   Family History   Family History  Problem Relation Age of Onset   Breast cancer Father 16   Stroke Mother    Heart attack Mother    Heart Problems Sister    Breast cancer Cousin 28       1 st cousin.  Maternal      Physical Exam  Triage Vital Signs: ED Triage Vitals  Enc Vitals Group     BP 05/08/22 2103 (!) 157/65     Pulse Rate 05/08/22 2103 71     Resp 05/08/22 2103 18     Temp 05/08/22 2103 98.2 F (36.8 C)     Temp Source 05/08/22 2103 Oral     SpO2 05/08/22 2103 97 %     Weight 05/08/22 2104 255 lb (115.7 kg)     Height 05/08/22 2104 '5\' 9"'$  (1.753 m)     Head Circumference --      Peak Flow --      Pain Score 05/08/22 2104 10     Pain Loc --      Pain Edu? --      Excl. in [place]? --     Updated Vital Signs: BP (!) 157/79   Pulse 64   Temp 97.7 F (36.5 C) (Oral)   Resp 20   Ht '5\' 9"'$  (1.753 m)   Wt 115.7 kg   SpO2 98%   BMI 37.66 kg/m    General: Awake, no distress.  CV:  RRR.  Good peripheral perfusion.  Resp:  Normal effort.  CTA B. Abd:  Nontender.  No distention.  Other:  RLE: No deformity noted.  Full range of motion hip without pain.  Full range of motion knee without pain.  Reports pain in her upper thigh and calf.  Calf is supple without swelling.  2+ distal pulses.  Brisk, less than 5-second capillary refill.   ED Results / Procedures / Treatments  Labs (all labs ordered are listed, but only abnormal results are  displayed) Labs Reviewed  CBC - Abnormal; Notable for the following components:      Result Value   RBC 3.05 (*)    Hemoglobin 8.4 (*)    HCT 27.4 (*)    All other components within normal limits  BASIC METABOLIC PANEL - Abnormal; Notable for the following components:   Glucose, Bld 164 (*)    BUN 42 (*)    Creatinine, Ser 9.38 (*)    Calcium 8.6 (*)    GFR, Estimated 4 (*)    All other components within normal limits  CK - Abnormal; Notable for the following components:   Total CK 29 (*)    All other components within normal limits     EKG  None   RADIOLOGY I have independently visualized and interpreted patient's chest and hip x-rays as well as noted the radiology interpretation:  Chest x-ray: Cardiomegaly; vascular congestion  Right hip x-ray: No acute osseous abnormality   Official radiology report(s): DG Chest 1 View  Result Date: 05/09/2022 CLINICAL DATA:  Weakness EXAM: CHEST  1 VIEW COMPARISON:  03/19/2022 FINDINGS: Right Port-A-Cath remains in place, unchanged. Cardiomegaly, aortic atherosclerosis. Vascular congestion. No confluent opacities, effusions or overt edema. No acute bony abnormality. IMPRESSION: Cardiomegaly, vascular congestion Electronically Signed   By: Rolm Baptise M.D.   On: 05/09/2022 03:35   DG Hip Unilat With Pelvis 2-3 Views Right  Result Date: 05/09/2022 CLINICAL DATA:  Pain for 3 months EXAM: DG HIP (WITH OR WITHOUT PELVIS) 2-3V RIGHT COMPARISON:  None Available. FINDINGS: Hip joints and SI joints symmetric. No acute bony abnormality. Specifically, no fracture, subluxation, or dislocation. IMPRESSION: No acute bony abnormality. Electronically Signed   By: Rolm Baptise M.D.   On: 05/09/2022 03:34     PROCEDURES:  Critical Care performed: No  Procedures   MEDICATIONS ORDERED IN ED: Medications  ketorolac (TORADOL) 30 MG/ML injection 15 mg (15 mg Intravenous Given 05/09/22 0119)  fentaNYL (SUBLIMAZE) injection 50 mcg (50 mcg  Intravenous Given 05/09/22 0120)  fentaNYL (SUBLIMAZE) injection 50 mcg (50 mcg Intravenous Given 05/09/22 0355)     IMPRESSION / MDM / ASSESSMENT AND PLAN / ED COURSE  I reviewed the triage vital signs and the nursing notes.                             71 year old female presenting with right lower leg pain.  I have personally reviewed patient's records and note that she had a duplex ultrasound done at [facility] on 05/06/2022 which was negative.  Patient's presentation is most consistent with acute complicated illness / injury requiring diagnostic workup.  We will obtain basic lab work, check CK.  Administer IV ketorolac and fentanyl for pain and reassess.  Clinical Course as of 05/09/22 0509  Mon May 09, 2022  0247 Patient unable to ambulate with 3 person assistance; will obtain right hip xrays  [JS]  0344 X-rays demonstrate no bony abnormalities; mild congestion noted on chest x-ray. Will consult hospitalist services for evaluation and admission. [JS]    Clinical Course User Index [JS] Paulette Blanch, MD     FINAL CLINICAL IMPRESSION(S) / ED DIAGNOSES   Final diagnoses:  Right leg pain  ESRD on dialysis [facility])  Generalized weakness     Rx / DC Orders   ED Discharge Orders     None        Note:  This document was prepared using Dragon voice recognition software and may include unintentional dictation errors.   Paulette Blanch, MD 05/09/22 [phone]

## 2022-05-09 NOTE — Assessment & Plan Note (Signed)
-  see above 

## 2022-05-09 NOTE — Assessment & Plan Note (Signed)
Recent A1c 5.  8, well controlled.  Patient's not taking 70/30 insulin currently. -Sliding scale insulin

## 2022-05-09 NOTE — Assessment & Plan Note (Signed)
-   Hold Lipitor as above

## 2022-05-10 LAB — CBG MONITORING, ED
Glucose-Capillary: 144 mg/dL — ABNORMAL HIGH (ref 70–99)
Glucose-Capillary: 133 mg/dL — ABNORMAL HIGH (ref 70–99)
Glucose-Capillary: 133 mg/dL — ABNORMAL HIGH (ref 70–99)

## 2022-05-10 LAB — BASIC METABOLIC PANEL
Anion gap: 12 (ref 5–15)
BUN: 27 mg/dL — ABNORMAL HIGH (ref 8–23)
CO2: 25 mmol/L (ref 22–32)
Calcium: 8.6 mg/dL — ABNORMAL LOW (ref 8.9–10.3)
Chloride: 102 mmol/L (ref 98–111)
Creatinine, Ser: 6.53 mg/dL — ABNORMAL HIGH (ref 0.44–1.00)
GFR, Estimated: 6 mL/min — ABNORMAL LOW (ref >60)
Glucose, Bld: 149 mg/dL — ABNORMAL HIGH (ref 70–99)
Potassium: 3.9 mmol/L (ref 3.5–5.1)
Sodium: 139 mmol/L (ref 135–145)

## 2022-05-10 LAB — GLUCOSE, CAPILLARY: Glucose-Capillary: 97 mg/dL (ref 70–99)

## 2022-05-10 NOTE — Evaluation (Signed)
Occupational Therapy Evaluation Patient Details Name: Cynthia Dean MRN: [id number] DOB: 1951-07-09 Today's Date: 05/10/2022   History of Present Illness Cynthia Dean is a 71 y.o. female with medical history significant of ESRD-HD (MWF), hypertension, hyperlipidemia, diabetes mellitus, stroke, depression, CAD, PE on Eliquis, seizure, dCHF, anemia, obesity, who presents with right leg pain, weakness, unable to ambulate.   Clinical Impression   Patient presenting with decreased independence in self-care, functional mobility, safety, and endurance. Patient reports she lives at home with her daughter and boyfriend who are able to provide assistance as needed. 3 stairs to get into the home, voids in brief , needing assistance for hygiene (does not transfer to toilet), and requires some assistance for LB dressing tasks. Patient reports she has a RW, but uses a WC with public transportation to get to and from appointments. Patient currently functioning at max A +2 for bed mobility, mod-max A for transfers sit <> stand using RW (patient could not come up to full stand or take any steps) , Max A +3 during lateral scoots (x2). Patient with c/o back pain 10/10. Patient left in bed with call bell in reach, bed alarm set, and all needs met.  Patient will benefit from acute OT to increase overall independence in the areas of ADLs, functional mobility, in order to safely discharge to the next venue of care.       Recommendations for follow up therapy are one component of a multi-disciplinary discharge planning process, led by the attending physician.  Recommendations may be updated based on patient status, additional functional criteria and insurance authorization.   Follow Up Recommendations  Skilled nursing-short term rehab (<3 hours/day)    Assistance Recommended at Discharge Frequent or constant Supervision/Assistance  Patient can return home with the following Two people to help with  walking and/or transfers;A lot of help with bathing/dressing/bathroom;Assistance with cooking/housework;Assist for transportation;Help with stairs or ramp for entrance    Functional Status Assessment  Patient has had a recent decline in their functional status and/or demonstrates limited ability to make significant improvements in function in a reasonable and predictable amount of time  Equipment Recommendations  Other (comment) (Defer to next venue of care.)       Precautions / Restrictions Precautions Precautions: Fall Restrictions Weight Bearing Restrictions: No      Mobility Bed Mobility Overal bed mobility: Needs Assistance Bed Mobility: Supine to Sit, Sit to Supine     Supine to sit: Max assist, +2 for physical assistance, +2 for safety/equipment Sit to supine: Max assist, +2 for physical assistance, +2 for safety/equipment        Transfers Overall transfer level: Needs assistance Equipment used: Rolling walker (2 wheels), 2 person hand held assist Transfers: Sit to/from Stand, Bed to chair/wheelchair/BSC Sit to Stand: Mod assist, Max assist, +2 physical assistance, +2 safety/equipment          Lateral/Scoot Transfers: Max assist General transfer comment: 2 lateral scoots to move up to [facility] Max A +3      Balance Overall balance assessment: Needs assistance Sitting-balance support: Feet supported, No upper extremity supported Sitting balance-Leahy Scale: Fair     Standing balance support: During functional activity, Reliant on assistive device for balance, Bilateral upper extremity supported Standing balance-Leahy Scale: Poor                             ADL either performed or assessed with clinical judgement   ADL Overall  ADL's : Needs assistance/impaired                     Lower Body Dressing: Total assistance                       Vision Patient Visual Report: No change from baseline              Pertinent  Vitals/Pain Pain Assessment Pain Assessment: 0-10 Pain Score: 10-Worst pain ever Pain Location: back Pain Descriptors / Indicators: Sharp Pain Intervention(s): Limited activity within patient's tolerance, RN gave pain meds during session, Repositioned, Monitored during session     Hand Dominance Right   Extremity/Trunk Assessment Upper Extremity Assessment Upper Extremity Assessment: Generalized weakness   Lower Extremity Assessment Lower Extremity Assessment: Generalized weakness       Communication Communication Communication: No difficulties   Cognition Arousal/Alertness: Awake/alert Behavior During Therapy: WFL for tasks assessed/performed Overall Cognitive Status: Within Functional Limits for tasks assessed                                                  Home Living Family/patient expects to be discharged to:: Private residence Living Arrangements: Spouse/significant other;Children (boyfriend and daughter) Available Help at Discharge: Family;Available PRN/intermittently Type of Home: House Home Access: Stairs to enter [employer] of Steps: 3 Entrance Stairs-Rails: None Home Layout: One level     Bathroom Shower/Tub: Teacher, early years/pre: Standard (wears brief at baseline; does not transfer to toilet.)     Home Equipment: Conservation officer, nature (2 wheels);Wheelchair - manual          Prior Functioning/Environment Prior Level of Function : Needs assist       Physical Assist : ADLs (physical)   ADLs (physical): Grooming;Bathing;Dressing;Toileting Mobility Comments: PRN assist for stairs ADLs Comments: PRN assist for LBD        OT Problem List: Decreased strength;Decreased activity tolerance;Decreased safety awareness;Impaired balance (sitting and/or standing);Pain      OT Treatment/Interventions: Self-care/ADL training;Therapeutic exercise;Therapeutic activities;Patient/family education    OT Goals(Current  goals can be found in the care plan section) Acute Rehab OT Goals Patient Stated Goal: to return home. OT Goal Formulation: With patient Time For Goal Achievement: 05/24/22 Potential to Achieve Goals: Good ADL Goals Pt Will Perform Lower Body Bathing: with min assist Pt Will Perform Lower Body Dressing: with min assist Pt Will Transfer to Toilet: with min assist Pt Will Perform Toileting - Clothing Manipulation and hygiene: with min assist  OT Frequency: Min 2X/week    Co-evaluation PT/OT/SLP Co-Evaluation/Treatment: Yes Reason for Co-Treatment: For patient/therapist safety;To address functional/ADL transfers PT goals addressed during session: Mobility/safety with mobility;Proper use of DME OT goals addressed during session: Strengthening/ROM;Proper use of Adaptive equipment and DME;ADL's and self-care      AM-PAC OT "6 Clicks" Daily Activity     Outcome Measure Help from another person eating meals?: None Help from another person taking care of personal grooming?: A Little Help from another person toileting, which includes using toliet, bedpan, or urinal?: A Lot Help from another person bathing (including washing, rinsing, drying)?: A Lot Help from another person to put on and taking off regular upper body clothing?: A Little Help from another person to put on and taking off regular lower body clothing?: A Lot 6 Click Score: 16  End of Session Equipment Utilized During Treatment: Rolling walker (2 wheels);Gait belt Nurse Communication: Mobility status  Activity Tolerance: Patient limited by pain Patient left: in bed;with call bell/phone within reach;with bed alarm set  OT Visit Diagnosis: Unsteadiness on feet (R26.81);Muscle weakness (generalized) (M62.81);Pain Pain - part of body:  (back)                Time: 0321-2248 OT Time Calculation (min): 26 min Charges:  OT General Charges $OT Visit: 1 Visit OT Evaluation $OT Eval Moderate Complexity: 1 Mod    [place],  OTS 05/10/2022, 12:04 PM

## 2022-05-10 NOTE — Progress Notes (Addendum)
[facility]  ROUNDING NOTE   Subjective:   Cynthia Dean is a 71 y.o. 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 Patient presents to ED with complaints of right leg pain and weakness. She has been admitted for Right leg pain [M79.604] ESRD on dialysis ([place]) [N18.6, Z99.2] Generalized weakness [R53.1] Unable to ambulate [R26.2]  Patient is known to our practice and receives outpatient dialysis treatments at [facility] on a MWF schedule, supervised by Dr Candiss Norse. Her last dialysis treatment was Friday and she denies missing any recent treatments. Patient reports pain in right leg began last week after receiving an MRI. She states her leg "squeezed" and she has had pain since then. Denies recent illness, nausea, vomiting or diarrhea. Denies shortness of breath or chest pain.   Labs on ED arrival unremarkable for renal patient. CK 29. Chest x-ray shows vascular congestion. Hip X ray negative for fracture.  We have been consulted to manage dialysis needs.   Patient was seen today in ER Patient is still awaiting bed Patient offers no new complaint. Patient concern continues to be pain in the right leg    Objective:  Vital signs in last 24 hours:  Temp:  [97.7 F (36.5 C)-99.5 F (37.5 C)] 99.5 F (37.5 C) (10/17 0513) Pulse Rate:  [59-73] 73 (10/17 0513) Resp:  [15-20] 16 (10/17 0513) BP: (153-196)/(65-98) 158/65 (10/17 0513) SpO2:  [93 %-100 %] 100 % (10/17 0513) Weight:  [113.7 kg-115.6 kg] 113.7 kg (10/16 1539)  Weight change: -0.067 kg Filed Weights   05/08/22 2104 05/09/22 1149 05/09/22 1539  Weight: 115.7 kg 115.6 kg 113.7 kg    Intake/Output: I/O last 3 completed shifts: In: -  Out: 1000 [Other:1000]   Intake/Output this shift:  No intake/output data recorded.  Physical Exam: General: NAD  Head:  Normocephalic, atraumatic. Moist oral mucosal membranes  Eyes: Anicteric  Lungs:  Diminished in bases, normal effort , [place] O2  Heart: Regular rate and rhythm  Abdomen:  Soft, nontender, obese  Extremities:  No peripheral edema.  Neurologic: Nonfocal, moving all four extremities  Skin: No lesions  Access: Lt upper AVG    Basic Metabolic Panel: Recent Labs  Lab 05/09/22 0054 05/10/22 0515  NA 139 139  K 4.0 3.9  CL 106 102  CO2 24 25  GLUCOSE 164* 149*  BUN 42* 27*  CREATININE 9.38* 6.53*  CALCIUM 8.6* 8.6*    Liver Function Tests: No results for input(s): "AST", "ALT", "ALKPHOS", "BILITOT", "PROT", "ALBUMIN" in the last 168 hours. No results for input(s): "LIPASE", "AMYLASE" in the last 168 hours. No results for input(s): "AMMONIA" in the last 168 hours.  CBC: Recent Labs  Lab 05/09/22 0054  WBC 10.2  HGB 8.4*  HCT 27.4*  MCV 89.8  PLT 254    Cardiac Enzymes: Recent Labs  Lab 05/09/22 0054  CKTOTAL 29*    BNP: Invalid input(s): "POCBNP"  CBG: Recent Labs  Lab 05/09/22 2039  GLUCAP 158*    Microbiology: Results for orders placed or performed during the hospital encounter of 03/19/22  Resp Panel by RT-PCR (Flu A&B, Covid) Anterior Nasal Swab     Status: None   Collection Time: 03/19/22  9:28 PM   Specimen: Anterior Nasal Swab  Result Value Ref Range Status   SARS Coronavirus 2 by RT PCR NEGATIVE NEGATIVE Final    Comment: (NOTE) SARS-CoV-2 target nucleic acids  are NOT DETECTED.  The SARS-CoV-2 RNA is generally detectable in upper respiratory specimens during the acute phase of infection. The lowest concentration of SARS-CoV-2 viral copies this assay can detect is 138 copies/mL. A negative result does not preclude SARS-Cov-2 infection and should not be used as the sole basis for treatment or other patient management decisions. A negative result may occur with  improper specimen collection/handling, submission of specimen other than nasopharyngeal  swab, presence of viral mutation(s) within the areas targeted by this assay, and inadequate number of viral copies(<138 copies/mL). A negative result must be combined with clinical observations, patient history, and epidemiological information. The expected result is Negative.  Fact Sheet for Patients:  EntrepreneurPulse.com.au  Fact Sheet for Healthcare Providers:  IncredibleEmployment.be  This test is no t yet approved or cleared by the [place] FDA and  has been authorized for detection and/or diagnosis of SARS-CoV-2 by FDA under an Emergency Use Authorization (EUA). This EUA will remain  in effect (meaning this test can be used) for the duration of the COVID-19 declaration under Section 564(b)(1) of the Act, 21 U.S.C.section 360bbb-3(b)(1), unless the authorization is terminated  or revoked sooner.       Influenza A by PCR NEGATIVE NEGATIVE Final   Influenza B by PCR NEGATIVE NEGATIVE Final    Comment: (NOTE) The Xpert Xpress SARS-CoV-2/FLU/RSV plus assay is intended as an aid in the diagnosis of influenza from Nasopharyngeal swab specimens and should not be used as a sole basis for treatment. Nasal washings and aspirates are unacceptable for Xpert Xpress SARS-CoV-2/FLU/RSV testing.  Fact Sheet for Patients: EntrepreneurPulse.com.au  Fact Sheet for Healthcare Providers: IncredibleEmployment.be  This test is not yet approved or cleared by the [place] FDA and has been authorized for detection and/or diagnosis of SARS-CoV-2 by FDA under an Emergency Use Authorization (EUA). This EUA will remain in effect (meaning this test can be used) for the duration of the COVID-19 declaration under Section 564(b)(1) of the Act, 21 U.S.C. section 360bbb-3(b)(1), unless the authorization is terminated or revoked.  Performed at [facility], [address] [place], [place] [address]   Blood  Culture (routine x 2)     Status: Abnormal   Collection Time: 03/19/22  9:28 PM   Specimen: BLOOD  Result Value Ref Range Status   Specimen Description   Final    BLOOD RIGHT ANTECUBITAL Performed at [facility], [address], [place], [place] [address]    Special Requests   Final    BOTTLES DRAWN AEROBIC AND ANAEROBIC Blood Culture adequate volume Performed at [facility], [address], [place], [place] [address]    Culture  Setup Time   Final    GRAM POSITIVE COCCI IN BOTH AEROBIC AND ANAEROBIC BOTTLES CRITICAL RESULT CALLED TO, READ BACK BY AND VERIFIED WITH: CAROLYN CHILDS 03/20/22 1457 AMK Performed at [facility], [address] [place],  [address]    Culture STAPHYLOCOCCUS CAPITIS (A)  Final   Report Status 03/23/2022 FINAL  Final   Organism ID, Bacteria STAPHYLOCOCCUS CAPITIS  Final      Susceptibility   Staphylococcus capitis - MIC*    CIPROFLOXACIN <=0.5 SENSITIVE Sensitive     ERYTHROMYCIN <=0.25 SENSITIVE Sensitive     GENTAMICIN <=0.5 SENSITIVE Sensitive     OXACILLIN <=0.25 SENSITIVE Sensitive     TETRACYCLINE <=1 SENSITIVE Sensitive     VANCOMYCIN <=0.5 SENSITIVE Sensitive     TRIMETH/SULFA <=10 SENSITIVE Sensitive     CLINDAMYCIN <=0.25  SENSITIVE Sensitive     RIFAMPIN <=0.5 SENSITIVE Sensitive     Inducible Clindamycin NEGATIVE Sensitive     * STAPHYLOCOCCUS CAPITIS  Blood Culture ID Panel (Reflexed)     Status: Abnormal   Collection Time: 03/19/22  9:28 PM  Result Value Ref Range Status   Enterococcus faecalis NOT DETECTED NOT DETECTED Final   Enterococcus Faecium NOT DETECTED NOT DETECTED Final   Listeria monocytogenes NOT DETECTED NOT DETECTED Final   Staphylococcus species DETECTED (A) NOT DETECTED Final    Comment: CRITICAL RESULT CALLED TO, READ BACK BY AND VERIFIED WITH: CAROLYN CHILDS 03/20/22 1457 AMK    Staphylococcus aureus (BCID) NOT DETECTED NOT DETECTED Final   Staphylococcus epidermidis NOT  DETECTED NOT DETECTED Final   Staphylococcus lugdunensis NOT DETECTED NOT DETECTED Final   Streptococcus species NOT DETECTED NOT DETECTED Final   Streptococcus agalactiae NOT DETECTED NOT DETECTED Final   Streptococcus pneumoniae NOT DETECTED NOT DETECTED Final   Streptococcus pyogenes NOT DETECTED NOT DETECTED Final   A.calcoaceticus-baumannii NOT DETECTED NOT DETECTED Final   Bacteroides fragilis NOT DETECTED NOT DETECTED Final   Enterobacterales NOT DETECTED NOT DETECTED Final   Enterobacter cloacae complex NOT DETECTED NOT DETECTED Final   Escherichia coli NOT DETECTED NOT DETECTED Final   Klebsiella aerogenes NOT DETECTED NOT DETECTED Final   Klebsiella oxytoca NOT DETECTED NOT DETECTED Final   Klebsiella pneumoniae NOT DETECTED NOT DETECTED Final   Proteus species NOT DETECTED NOT DETECTED Final   Salmonella species NOT DETECTED NOT DETECTED Final   Serratia marcescens NOT DETECTED NOT DETECTED Final   Haemophilus influenzae NOT DETECTED NOT DETECTED Final   Neisseria meningitidis NOT DETECTED NOT DETECTED Final   Pseudomonas aeruginosa NOT DETECTED NOT DETECTED Final   Stenotrophomonas maltophilia NOT DETECTED NOT DETECTED Final   Candida albicans NOT DETECTED NOT DETECTED Final   Candida auris NOT DETECTED NOT DETECTED Final   Candida glabrata NOT DETECTED NOT DETECTED Final   Candida krusei NOT DETECTED NOT DETECTED Final   Candida parapsilosis NOT DETECTED NOT DETECTED Final   Candida tropicalis NOT DETECTED NOT DETECTED Final   Cryptococcus neoformans/gattii NOT DETECTED NOT DETECTED Final    Comment: Performed at [facility], [address] [place], [place] [address]  Blood Culture (routine x 2)     Status: Abnormal   Collection Time: 03/19/22 10:23 PM   Specimen: BLOOD  Result Value Ref Range Status   Specimen Description   Final    BLOOD BLOOD RIGHT FOREARM Performed at [facility], [address] [place], [place] [address]    Special  Requests   Final    BOTTLES DRAWN AEROBIC AND ANAEROBIC Blood Culture adequate volume Performed at [facility], [address] [place], [place] [address]    Culture  Setup Time   Final    GRAM POSITIVE COCCI IN BOTH AEROBIC AND ANAEROBIC BOTTLES CRITICAL RESULT CALLED TO, READ BACK BY AND VERIFIED WITH: ELVERA MADUEME 03/20/22 1545 AMK Performed at [facility], [address] [place], [place] [address]    Culture (A)  Final    STAPHYLOCOCCUS CAPITIS SUSCEPTIBILITIES PERFORMED ON PREVIOUS CULTURE WITHIN THE LAST 5 DAYS. Performed at [facility], [place] [address], [place], [place] [address]    Report Status 03/22/2022 FINAL  Final  Urine Culture     Status: Abnormal   Collection Time: 03/20/22  1:25 AM   Specimen: In/Out Cath Urine  Result Value Ref Range Status   Specimen Description  Final    IN/OUT CATH URINE Performed at [facility], [address] [place], [place] [address]    Special Requests   Final    NONE Performed at [facility], [place], [place] [address]    Culture 4,000 COLONIES/mL STAPHYLOCOCCUS EPIDERMIDIS (A)  Final   Report Status 03/22/2022 FINAL  Final   Organism ID, Bacteria STAPHYLOCOCCUS EPIDERMIDIS (A)  Final      Susceptibility   Staphylococcus epidermidis - MIC*    CIPROFLOXACIN <=0.5 SENSITIVE Sensitive     GENTAMICIN <=0.5 SENSITIVE Sensitive     NITROFURANTOIN <=16 SENSITIVE Sensitive     OXACILLIN <=0.25 SENSITIVE Sensitive     TETRACYCLINE <=1 SENSITIVE Sensitive     VANCOMYCIN 2 SENSITIVE Sensitive     TRIMETH/SULFA 80 RESISTANT Resistant     CLINDAMYCIN >=8 RESISTANT Resistant     RIFAMPIN <=0.5 SENSITIVE Sensitive     Inducible Clindamycin NEGATIVE Sensitive     * 4,000 COLONIES/mL STAPHYLOCOCCUS EPIDERMIDIS  Culture, blood (Routine X 2) w Reflex to ID Panel     Status: None   Collection Time: 03/21/22 11:00 AM   Specimen: BLOOD  Result Value Ref Range Status   Specimen  Description BLOOD BLOOD RIGHT FOREARM  Final   Special Requests   Final    BOTTLES DRAWN AEROBIC AND ANAEROBIC Blood Culture adequate volume   Culture   Final    NO GROWTH 5 DAYS Performed at [facility], [address] [place], [place] [address]    Report Status 03/26/2022 FINAL  Final  Culture, blood (Routine X 2) w Reflex to ID Panel     Status: None   Collection Time: 03/22/22 11:53 AM   Specimen: BLOOD RIGHT HAND  Result Value Ref Range Status   Specimen Description BLOOD RIGHT HAND  Final   Special Requests   Final    BOTTLES DRAWN AEROBIC AND ANAEROBIC Blood Culture adequate volume   Culture   Final    NO GROWTH 5 DAYS Performed at [facility], [address], [place], [place] [address]    Report Status 03/27/2022 FINAL  Final  Aerobic/Anaerobic Culture w Gram Stain (surgical/deep wound)     Status: None   Collection Time: 03/25/22 10:30 AM   Specimen: Wound; Blood  Result Value Ref Range Status   Specimen Description WOUND LEFT BREAST  Final   Special Requests BLOOD HEMATOMA  Final   Gram Stain   Final    RARE WBC PRESENT, PREDOMINANTLY MONONUCLEAR NO ORGANISMS SEEN    Culture   Final    No growth aerobically or anaerobically. Performed at [facility], [place] [address] [place], [place] [address]    Report Status 03/30/2022 FINAL  Final    Coagulation Studies: No results for input(s): "LABPROT", "INR" in the last 72 hours.  Urinalysis: No results for input(s): "COLORURINE", "LABSPEC", "PHURINE", "GLUCOSEU", "HGBUR", "BILIRUBINUR", "KETONESUR", "PROTEINUR", "UROBILINOGEN", "NITRITE", "LEUKOCYTESUR" in the last 72 hours.  Invalid input(s): "APPERANCEUR"    Imaging: DG Tibia/Fibula Right  Result Date: 05/09/2022 CLINICAL DATA:  Right lower leg pain. EXAM: RIGHT TIBIA AND FIBULA - 2 VIEW COMPARISON:  None Available. FINDINGS: No fracture, dislocation, bony lesion or bony destruction identified. Extensive popliteal and tibial arterial  vascular calcifications noted. IMPRESSION: No acute findings in the right lower leg. Extensive arterial vascular calcifications. Electronically Signed   By: Aletta Edouard M.D.   On: 05/09/2022 16:35   DG Chest 1 View  Result Date: 05/09/2022  CLINICAL DATA:  Weakness EXAM: CHEST  1 VIEW COMPARISON:  03/19/2022 FINDINGS: Right Port-A-Cath remains in place, unchanged. Cardiomegaly, aortic atherosclerosis. Vascular congestion. No confluent opacities, effusions or overt edema. No acute bony abnormality. IMPRESSION: Cardiomegaly, vascular congestion Electronically Signed   By: Rolm Baptise M.D.   On: 05/09/2022 03:35   DG Hip Unilat With Pelvis 2-3 Views Right  Result Date: 05/09/2022 CLINICAL DATA:  Pain for 3 months EXAM: DG HIP (WITH OR WITHOUT PELVIS) 2-3V RIGHT COMPARISON:  None Available. FINDINGS: Hip joints and SI joints symmetric. No acute bony abnormality. Specifically, no fracture, subluxation, or dislocation. IMPRESSION: No acute bony abnormality. Electronically Signed   By: Rolm Baptise M.D.   On: 05/09/2022 03:34     Medications:    anticoagulant sodium citrate      apixaban  5 mg Oral BID   aspirin EC  81 mg Oral Daily   Chlorhexidine Gluconate Cloth  6 each Topical Q0600   vitamin D3  1,000 Units Oral Daily   cinacalcet  30 mg Oral Q breakfast   gabapentin  300 mg Oral BID   insulin aspart  0-5 Units Subcutaneous QHS   insulin aspart  0-9 Units Subcutaneous TID WC   ipratropium-albuterol  3 mL Inhalation BID   levETIRAcetam  250 mg Oral Q M,W,F-1800   lidocaine  1 patch Transdermal Q24H   losartan  100 mg Oral QHS   metoprolol succinate  100 mg Oral Daily   pantoprazole  40 mg Oral Daily   sertraline  25 mg Oral Daily   torsemide  100 mg Oral Daily   traZODone  25 mg Oral QHS   acetaminophen, albuterol, alteplase, anticoagulant sodium citrate, dextromethorphan-guaiFENesin, diphenoxylate-atropine, fentaNYL (SUBLIMAZE) injection, fluticasone, heparin, hydrALAZINE,  lidocaine (PF), lidocaine-prilocaine, LORazepam, ondansetron (ZOFRAN) IV, pentafluoroprop-tetrafluoroeth  Assessment/ Plan:  Ms. Nautika Cressey is a 71 y.o.  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 Patient presents to ED with complaints of right leg pain and weakness. She has been admitted for Right leg pain [M79.604] ESRD on dialysis ([place]) [N18.6, Z99.2] Generalized weakness [R53.1] Unable to ambulate [R26.2]  [facility] [place] [facility]/MWF/Lt AVG  End stage renal disease on hemodialysis.       Patient did get a dialysis treatment yesterday no need for dialysis today.      We will dialyze patient tomorrow   2. Anemia of chronic kidney disease, Normocytic Lab Results  Component Value Date   HGB 8.4 (L) 05/09/2022    Hgb below target. Patient receives [place] outpatient. Will continue to monitor and assess need for ESA.   3. Secondary Hyperparathyroidism: with outpatient labs: PTH 613, phosphorus 4.8, calcium 9.0 on 03/07/22.    Lab Results  Component Value Date   PTH 131 (H) 03/14/2018   CALCIUM 8.6 (L) 05/10/2022   PHOS 3.5 03/30/2022     Calcium and phosphorus within desired range.   4. Hypertension with chronic kidney disease. Home regimen includes losartan, metoprolol, and torsemide. All held  5. Diabetes mellitus type II with chronic kidney disease: insulin dependent.. Patient is being followed by the primary team    6.Inability to ambulate secondary to right leg pain. Primary team is following   LOS: 1 Harriett Azar s Sissy Goetzke 10/17/20237:46 AM

## 2022-05-10 NOTE — Evaluation (Signed)
Physical Therapy Evaluation Patient Details Name: Cynthia Dean MRN: [id number] DOB: May 06, 1951 Today's Date: 05/10/2022  History of Present Illness  Pt is a 71 y.o. female presenting to hospital 10/15 with c/o R leg pain; unable to walk secondary to pain; has had R LE pain for more than 3 months but now worsened.  PMH includes ESRD on HD MWF, acute on chronic respiratory failure with hypoxia, COPD, CAD, cryptogenic stroke, DM, diarrhea, GI bleed, heart murmur, PNA d/t COVID-19 virus, seizure, PE.  Clinical Impression  Prior to hospital admission, pt was ambulatory with RW; PRN O2 use (mostly at night); lives with daughter and boyfriend in 1 level home with 3 STE no railing; uses manual w/c (and bus for transportation) to/from dialysis.  Currently pt is max assist x2 for bed mobility; close SBA sitting balance; x3 trials standing from elevated bed height up to RW (mod to max assist x2 and pt only able to come to 1/2 to 3/4th stand); and x2 lateral scoots to L along bed with 3 assist (bed height elevated).  Pt c/o 9-10/10 R LBP during session (nurse place pain patch on R low back during session)--pt reports having this pain for months and worsens when it gets "cold".  Pt would benefit from skilled PT to address noted impairments and functional limitations (see below for any additional details).  Upon hospital discharge, pt would benefit from SNF.    Recommendations for follow up therapy are one component of a multi-disciplinary discharge planning process, led by the attending physician.  Recommendations may be updated based on patient status, additional functional criteria and insurance authorization.  Follow Up Recommendations Skilled nursing-short term rehab (<3 hours/day) Can patient physically be transported by private vehicle: No    Assistance Recommended at Discharge Frequent or constant Supervision/Assistance  Patient can return home with the following  Two people to help with walking  and/or transfers;Two people to help with bathing/dressing/bathroom;Assistance with cooking/housework;Assist for transportation;Help with stairs or ramp for entrance    Equipment Recommendations Rolling walker (2 wheels);BSC/3in1;Wheelchair (measurements PT);Wheelchair cushion (measurements PT);Hospital bed  Recommendations for Other Services  OT consult    Functional Status Assessment Patient has had a recent decline in their functional status and demonstrates the ability to make significant improvements in function in a reasonable and predictable amount of time.     Precautions / Restrictions Precautions Precautions: Fall Precaution Comments: Seizure; L UE fistula Restrictions Weight Bearing Restrictions: No      Mobility  Bed Mobility Overal bed mobility: Needs Assistance Bed Mobility: Supine to Sit, Sit to Supine     Supine to sit: Max assist, +2 for physical assistance, +2 for safety/equipment Sit to supine: Max assist, +2 for physical assistance, +2 for safety/equipment   General bed mobility comments: assist for trunk and B LE's; via logrolling (to improve comfort d/t R LBP)    Transfers Overall transfer level: Needs assistance Equipment used: Rolling walker (2 wheels), None Transfers: Sit to/from Stand, Bed to chair/wheelchair/BSC Sit to Stand: Mod assist, Max assist, +2 physical assistance, +2 safety/equipment, From elevated surface          Lateral/Scoot Transfers: From elevated surface (+3 physical assistance) General transfer comment: x3 trials standing from elevated bed height up to RW (mod to max assist x2 and pt only able to come to 1/2 to 3/4th stand); x2 lateral scoots to L along bed with 3 assist (bed height elevated)    Ambulation/Gait  General Gait Details: pt unable to take any steps with 2 assist and walker use  Stairs            Wheelchair Mobility    Modified Rankin (Stroke Patients Only)       Balance Overall  balance assessment: Needs assistance Sitting-balance support: No upper extremity supported, Feet supported Sitting balance-Leahy Scale: Fair Sitting balance - Comments: steady static sitting   Standing balance support: During functional activity, Reliant on assistive device for balance, Bilateral upper extremity supported Standing balance-Leahy Scale: Poor Standing balance comment: pt unable to come to upright standing with 2 assist and RW use                             Pertinent Vitals/Pain Pain Assessment Pain Assessment: 0-10 Pain Score: 10-Worst pain ever Pain Location: R low back Pain Descriptors / Indicators: Sharp Pain Intervention(s): Limited activity within patient's tolerance, Monitored during session, Repositioned, RN gave pain meds during session (pain patch applied to R low back) Vitals (HR and O2 on 2 L via nasal cannula) stable and WFL throughout treatment session.    Home Living Family/patient expects to be discharged to:: Private residence Living Arrangements: Spouse/significant other;Children (boyfriend and daughter) Available Help at Discharge: Family;Available PRN/intermittently Type of Home: House Home Access: Stairs to enter Entrance Stairs-Rails: None Entrance Stairs-Number of Steps: 3   Home Layout: One level Home Equipment: Conservation officer, nature (2 wheels);Wheelchair - manual;Other (comment) Additional Comments: PRN O2 use (mostly uses 2L at night)    Prior Function Prior Level of Function : Needs assist       Physical Assist : ADLs (physical)   ADLs (physical): Grooming;Bathing;Dressing;Toileting Mobility Comments: Ambulates with RW short distances at home with assist; uses w/c (takes bus) to dialysis; no recent falls ADLs Comments: PRN assist for LBD     Hand Dominance   Dominant Hand: Right    Extremity/Trunk Assessment   Upper Extremity Assessment Upper Extremity Assessment: Generalized weakness;Defer to OT evaluation    Lower  Extremity Assessment Lower Extremity Assessment: LLE deficits/detail;RLE deficits/detail RLE Deficits / Details: hip flexion 2/5; knee flexion/extension 2+/5; DF 2+/5; R DF grossly to neutral ROM RLE: Unable to fully assess due to pain LLE Deficits / Details: at least 3/5 hip flexion, knee flexion/extension, and DF/PF    Cervical / Trunk Assessment Cervical / Trunk Assessment: Other exceptions Cervical / Trunk Exceptions: forward head/shoulders  Communication   Communication: No difficulties  Cognition Arousal/Alertness: Awake/alert Behavior During Therapy: WFL for tasks assessed/performed Overall Cognitive Status: Within Functional Limits for tasks assessed                                          General Comments  Pt agreeable to PT session.    Exercises     Assessment/Plan    PT Assessment Patient needs continued PT services  PT Problem List Decreased strength;Decreased activity tolerance;Decreased balance;Decreased mobility;Decreased knowledge of use of DME;Pain       PT Treatment Interventions DME instruction;Gait training;Stair training;Functional mobility training;Therapeutic activities;Therapeutic exercise;Balance training;Patient/family education    PT Goals (Current goals can be found in the Care Plan section)  Acute Rehab PT Goals Patient Stated Goal: to improve pain and mobility PT Goal Formulation: With patient Time For Goal Achievement: 05/24/22 Potential to Achieve Goals: Good    Frequency Min 2X/week  Co-evaluation PT/OT/SLP Co-Evaluation/Treatment: Yes Reason for Co-Treatment: For patient/therapist safety;To address functional/ADL transfers PT goals addressed during session: Mobility/safety with mobility;Proper use of DME OT goals addressed during session: Strengthening/ROM;Proper use of [employer] equipment and DME;ADL's and self-care       AM-PAC PT "6 Clicks" Mobility  Outcome Measure Help needed turning from your back to  your side while in a flat bed without using bedrails?: A Little Help needed moving from lying on your back to sitting on the side of a flat bed without using bedrails?: Total Help needed moving to and from a bed to a chair (including a wheelchair)?: Total Help needed standing up from a chair using your arms (e.g., wheelchair or bedside chair)?: Total Help needed to walk in hospital room?: Total Help needed climbing 3-5 steps with a railing? : Total 6 Click Score: 8    End of Session Equipment Utilized During Treatment: Gait belt Activity Tolerance: Patient limited by fatigue Patient left: in bed;with call bell/phone within reach;with bed alarm set;Other (comment) (B heels floating via pillow support) Nurse Communication: Mobility status;Precautions;Patient requests pain meds PT Visit Diagnosis: Other abnormalities of gait and mobility (R26.89);Muscle weakness (generalized) (M62.81);Pain Pain - Right/Left: Right Pain - part of body:  (low back)    Time: 3295-1884 PT Time Calculation (min) (ACUTE ONLY): 27 min   Charges:   PT Evaluation $PT Eval Low Complexity: 1 Low PT Treatments $Therapeutic Activity: 8-22 mins       Leitha Bleak, PT 05/10/22, 12:30 PM

## 2022-05-10 NOTE — Progress Notes (Signed)
Progress Note   [name] UXL:[id number] DOB: May 12, 1951 DOA: 05/09/2022     1 DOS: the patient was seen and examined on 05/10/2022   Brief hospital course: Cynthia Dean is a 71 y.o. female with medical history significant of ESRD-HD (MWF), hypertension, hyperlipidemia, diabetes mellitus, stroke, depression, CAD, PE on Eliquis, seizure, dCHF, anemia, obesity, who presents with right leg pain, weakness, unable to ambulate.     Patient states that she has right lower leg pain for more than 3 months, which has worsened since yesterday.  The pain is constant, 8 out of 10 in severity, sharp, aching, nonradiating.  Denies any injury.  The pain is located in the front of right lower leg, denies tenderness to calf area.  No skin lesion.  Patient states that the pain is so severe that she cannot ambulate. No fever or chills.  Patient does not have chest pain, cough, shortness breath.  Denies nausea vomiting or abdominal pain.  Patient reports diarrhea.  She states that she had 4 times of loose stool bowel movement yesterday. Pt had negative right leg venous duplex for DVT on 10/13.  10/17: Patient still continues to have pain in the right lower extremity.  PT evaluated and recommended placement in a rehab.  Assessment and Plan: * Unable to ambulate unable to ambulate in due to right leg pain: Patient has severe pain in anterior right lower leg.  Etiology is not clear.  X-ray of right leg is negative for bony fracture.  Patient had negative venous Doppler for DVT.  I also did portable Doppler, which showed good pulses of DP/PT.  Other differential diagnosis include peripheral neuropathy, side effects of Lipitor, but patient has unilateral leg pain.  -Admitted to telemetry bed as inpatient -PT/OT Placement in a skilled nursing facility short-term rehab -Pain control: As needed fentanyl, Tylenol, Lidoderm -will hold lipitor -Started Neurontin 300 mg twice daily reassess  effect on pain -fall precaution   Right leg pain -see above  ESRD on dialysis [facility]) -consulted Dr. Theador Hawthorne of renal for HD  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-stage renal disease) ([place]) Hemoglobin stable 8.4 (7.8 on 04/11/2022) -Follow-up with CBC  CAD (coronary artery disease) No chest pain -Hold Lipitor as above -Continue aspirin  Chronic diastolic CHF (congestive heart failure) ([place]) Patient does not have leg edema.  CHF is compensated.  2D echo 03/22/2022 showed EF of 50 to 55% with grade 2 diastolic dysfunction. -Volume management per renal by dialysis  Essential hypertension - IV hydralazine as needed -Cozaar, metoprolol   COPD (chronic obstructive pulmonary disease) (HCC) Stable - Bronchodilators  Depression - Continue home medications  Dyslipidemia - Hold Lipitor as above  Pulmonary emboli (HCC) - Continue Eliquis  Seizure (HCC) -Seizure precaution -As needed Ativan for seizure -Continue home Keppra to 50 mg 3 times daily   Stroke ([place]) - Continue aspirin   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) Recent A1c 5.  8, well controlled.  Patient's not taking 70/30 insulin currently. -Sliding scale insulin  Obesity with body mass index (BMI) of 30.0 to 39.9  BMI= 37.02,   and BW= 113.7Kg -Diet and exercise.   -Encourage to lose weight.   Diarrhea - Check C. difficile PCR        Subjective: Patient was seen and examined at bedside today.  Patient still continues to complain of pain in the right lower extremity in the anterior shin area.  Physical Exam: Vitals:   05/09/22 1539 05/10/22 5366  05/10/22 0929 05/10/22 1320  BP:  (!) 158/65 (!) 156/70 (!) 151/61  Pulse:  73 69 69  Resp:  '16 20 16  '$ Temp:  99.5 F (37.5 C) 98.4 F (36.9 C)   TempSrc:  Oral Oral   SpO2:  100% 100% 97%  Weight: 113.7 kg     Height:       General: Not in acute distress HEENT:       Eyes: PERRL, EOMI, no scleral icterus.       ENT: No discharge  from the ears and nose, no pharynx injection, no tonsillar enlargement.        Neck: No JVD, no bruit, no mass felt. Heme: No neck lymph node enlargement. Cardiac: S1/S2, RRR, No gallops or rubs. Respiratory: No rales, wheezing, rhonchi or rubs. GI: Soft, nondistended, nontender, no rebound pain, no organomegaly, BS present. GU: No hematuria Ext: No pitting leg edema bilaterally. 1+DP/PT pulse bilaterally. Have good DP/PT pulses by portable Doppler. Musculoskeletal: No joint deformities, No joint redness or warmth. Has tenderness in anterior aspect of right lower leg. Skin: No rashes.  Neuro: Alert, oriented X3, cranial nerves II-XII grossly intact, moves all extremities. Psych: Patient is not psychotic, no suicidal or hemocidal ideation.   Data Reviewed:  Reviewed CMP within normal limits except elevated creatinine which is expected in end-stage renal disease  Family Communication: Talked with the patient's daughter present at bedside    Planned Discharge Destination: Home    Time spent: 35 minutes  Author: Carlyle Lipa, MD 05/10/2022 2:11 PM  For on call review [email or web address].

## 2022-05-10 NOTE — TOC Initial Note (Signed)
Transition of Care [facility]) - Initial/Assessment Note    Patient Details  Name: Cynthia Dean MRN: [id number] Date of Birth: Apr 12, 1951  Transition of [facility]) CM/SW Contact:    Alberteen Sam, LCSW Phone Number: 05/10/2022, 10:54 AM  Clinical Narrative:                  [facility] consult received for HH/DME needs. CSW met with patient and family at bedside, they report patient currently sees Dr. Eather Colas as PCP, uses O2 PRN at home through [employer], and has a walker at home. Will need a 3in1 ordered close to dc time frame. Patient has extensive family support at home who cares for her. Reports she was set up with [facility] in the past but no one ever came, also recently discharged from facility. Reports depending on what PT recommends she would be open to [facility] vs SNF at discharge if needed.   TOC will continue to follow for discharge planning needs. Pending PT/OT recs for appropriate dispo.   Expected Discharge Plan:  (TBD) Barriers to Discharge: Continued Medical Work up   Patient Goals and CMS Choice Patient states their goals for this hospitalization and ongoing recovery are:: to go home CMS Medicare.gov Compare Post Acute Care list provided to:: Patient Choice offered to / list presented to : Patient  Expected Discharge Plan and Services Expected Discharge Plan:  (TBD)       Living arrangements for the past 2 months: Single Family Home                                      Prior Living Arrangements/Services Living arrangements for the past 2 months: Single Family Home Lives with:: Relatives                   Activities of Daily Living      Permission Sought/Granted                  Emotional Assessment       Orientation: : Oriented to Self, Oriented to Place, Oriented to  Time, Oriented to Situation Alcohol / Substance Use: Not Applicable Psych Involvement: No (comment)  Admission diagnosis:  Right leg pain [M79.604] ESRD on dialysis ([place]) [N18.6,  Z99.2] Generalized weakness [R53.1] Unable to ambulate [R26.2] Patient Active Problem List   Diagnosis Date Noted   Unable to ambulate 05/09/2022   ESRD on dialysis [facility])    Right leg pain    Depression    Pulmonary emboli (HCC)    Obesity with body mass index (BMI) of 30.0 to 39.9    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-stage renal disease) ([place])    Chronic diastolic CHF (congestive heart failure) (HCC)    Seroma of breast    Bacteremia due to coagulase-negative Staphylococcus    Left breast abscess    Urinary tract infection 03/22/2022   Anemia of chronic disease 03/22/2022   Acute pulmonary embolism without acute cor pulmonale ([place]) 03/20/2022   Dyslipidemia 03/20/2022   Diabetic neuropathy ([place]) 03/20/2022   Sepsis due to cellulitis ([place]) 03/20/2022   Delay kidney tx func d/t fluid overload requiring acute dialysis ([place]) 01/29/2020   Delay kidney tx func d/t ATN and fluid overload require acute dialysis ([place]) 01/29/2020   Hypoglycemia 10/21/2019   Unresponsiveness 10/21/2019   GERD (gastroesophageal reflux disease) 10/21/2019   Hypothermia    Sepsis with encephalopathy without septic shock ([place])  Diarrhea    Acute metabolic encephalopathy    Acute on chronic diastolic CHF (congestive heart failure) ([place])    Acute on chronic respiratory failure with hypoxia ([place]) 07/30/2019   Rectal bleed 02/13/2019   Acute respiratory failure with hypoxia ([place]) 12/31/2018   Aphasia as late effect of cerebrovascular accident (CVA) 05/01/2018   Seizure ([place]) 03/14/2018   Stroke ([place]) 03/07/2018   Slurred speech 11/14/2017   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[place]) 11/12/2017   CAD (coronary artery disease) 11/12/2017   COPD (chronic obstructive pulmonary disease) ([place]) 11/12/2017   ESRD on hemodialysis ([place]) 11/12/2017   CVA (cerebral vascular accident) ([place]) 04/14/2016   Essential hypertension 11/27/2015   Hyperlipidemia 11/27/2015   Prolonged Q-T interval on ECG 52/84/1324    Aphasia complicating stroke 40/04/2724   Cervical spine arthritis 07/27/2015   Diabetic retinopathy without macular edema associated with type 2 diabetes mellitus ([place]) 07/27/2015   Osteoarthritis of spine with radiculopathy, lumbar region 07/27/2015   Obesity, Class III, BMI 40-49.9 (morbid obesity) ([place]) 07/27/2015   Vitamin D deficiency 36/64/4034   Diastolic heart failure ([place]) 10/27/2010   PCP:  Lowella Bandy, MD Pharmacy:   [facility] [address], [place] [address] Phone: [phone] Fax: [phone]     Social Determinants of Health (SDOH) Interventions    Readmission Risk Interventions    10/25/2019    8:43 AM 10/24/2019   10:14 AM  Readmission Risk Prevention Plan  Transportation Screening Complete   PCP or Specialist Appt within 3-5 Days Not Complete   HRI or [place] Complete Complete  Palliative Care Screening Not Applicable   Medication Review (RN Care Manager) Complete

## 2022-05-10 NOTE — Progress Notes (Signed)
Established hemodialysis patient known at [facility] MWF 6:00am. Patient stated no dialysis concerns.

## 2022-05-11 ENCOUNTER — Inpatient Hospital Stay: Payer: Medicare Other

## 2022-05-11 DIAGNOSIS — R262 Difficulty in walking, not elsewhere classified: Secondary | ICD-10-CM | POA: Diagnosis not present

## 2022-05-11 LAB — RENAL FUNCTION PANEL
Albumin: 2.7 g/dL — ABNORMAL LOW (ref 3.5–5.0)
Anion gap: 12 (ref 5–15)
BUN: 50 mg/dL — ABNORMAL HIGH (ref 8–23)
CO2: 25 mmol/L (ref 22–32)
Calcium: 8.6 mg/dL — ABNORMAL LOW (ref 8.9–10.3)
Chloride: 100 mmol/L (ref 98–111)
Creatinine, Ser: 8.81 mg/dL — ABNORMAL HIGH (ref 0.44–1.00)
GFR, Estimated: 4 mL/min — ABNORMAL LOW (ref >60)
Glucose, Bld: 131 mg/dL — ABNORMAL HIGH (ref 70–99)
Phosphorus: 6.5 mg/dL — ABNORMAL HIGH (ref 2.5–4.6)
Potassium: 4.6 mmol/L (ref 3.5–5.1)
Sodium: 137 mmol/L (ref 135–145)

## 2022-05-11 LAB — CBC WITH DIFFERENTIAL/PLATELET
Abs Immature Granulocytes: 0.04 10*3/uL (ref 0.00–0.07)
Basophils Absolute: 0 10*3/uL (ref 0.0–0.1)
Basophils Relative: 0 %
Eosinophils Absolute: 0.1 10*3/uL (ref 0.0–0.5)
Eosinophils Relative: 1 %
HCT: 24.8 % — ABNORMAL LOW (ref 36.0–46.0)
Hemoglobin: 8 g/dL — ABNORMAL LOW (ref 12.0–15.0)
Immature Granulocytes: 1 %
Lymphocytes Relative: 21 %
Lymphs Abs: 1.8 10*3/uL (ref 0.7–4.0)
MCH: 27.7 pg (ref 26.0–34.0)
MCHC: 32.3 g/dL (ref 30.0–36.0)
MCV: 85.8 fL (ref 80.0–100.0)
Monocytes Absolute: 0.8 10*3/uL (ref 0.1–1.0)
Monocytes Relative: 9 %
Neutro Abs: 6 10*3/uL (ref 1.7–7.7)
Neutrophils Relative %: 68 %
Platelets: 221 10*3/uL (ref 150–400)
RBC: 2.89 MIL/uL — ABNORMAL LOW (ref 3.87–5.11)
RDW: 14.4 % (ref 11.5–15.5)
WBC: 8.7 10*3/uL (ref 4.0–10.5)
nRBC: 0 % (ref 0.0–0.2)

## 2022-05-11 LAB — HEPATITIS B SURFACE ANTIBODY, QUANTITATIVE: Hep B S AB Quant (Post): 9.9 m[IU]/mL — ABNORMAL LOW (ref >9.9)

## 2022-05-11 LAB — GLUCOSE, CAPILLARY
Glucose-Capillary: 114 mg/dL — ABNORMAL HIGH (ref 70–99)
Glucose-Capillary: 128 mg/dL — ABNORMAL HIGH (ref 70–99)
Glucose-Capillary: 182 mg/dL — ABNORMAL HIGH (ref 70–99)

## 2022-05-11 MED ORDER — HYDROMORPHONE HCL 2 MG PO TABS
2.0000 mg | ORAL_TABLET | ORAL | Status: DC | PRN
  Administered 2022-05-11 – 2022-05-12 (×2): 4 mg via ORAL
  Administered 2022-05-13: 2 mg via ORAL
  Administered 2022-05-15 – 2022-05-16 (×3): 4 mg via ORAL
  Filled 2022-05-11 (×4): qty 2
  Filled 2022-05-11: qty 1
  Filled 2022-05-11 (×3): qty 2

## 2022-05-11 MED ORDER — TRAZODONE HCL 50 MG PO TABS
50.0000 mg | ORAL_TABLET | Freq: Every evening | ORAL | Status: DC | PRN
  Filled 2022-05-11: qty 1

## 2022-05-11 MED ORDER — METOPROLOL TARTRATE 5 MG/5ML IV SOLN: 5.0000 mg | INTRAVENOUS | Status: DC | PRN

## 2022-05-11 MED ORDER — GUAIFENESIN 100 MG/5ML PO LIQD
5.0000 mL | ORAL | Status: DC | PRN
  Administered 2022-05-11 (×2): 5 mL via ORAL
  Filled 2022-05-11 (×2): qty 10

## 2022-05-11 MED ORDER — HYDRALAZINE HCL 20 MG/ML IJ SOLN: 10.0000 mg | INTRAMUSCULAR | Status: DC | PRN

## 2022-05-11 MED ORDER — SENNOSIDES-DOCUSATE SODIUM 8.6-50 MG PO TABS
1.0000 | ORAL_TABLET | Freq: Every evening | ORAL | Status: DC | PRN
  Administered 2022-05-13 – 2022-05-15 (×2): 1 via ORAL
  Filled 2022-05-11 (×2): qty 1

## 2022-05-11 MED ORDER — IPRATROPIUM-ALBUTEROL 0.5-2.5 (3) MG/3ML IN SOLN: 3.0000 mL | Freq: Four times a day (QID) | RESPIRATORY_TRACT | Status: DC | PRN

## 2022-05-11 NOTE — Progress Notes (Signed)
   05/11/22 1400  Vitals  Temp 98.3 F (36.8 C)  Temp Source Oral  BP (!) 182/80  MAP (mmHg) 111  BP Location Right Arm  BP Method Automatic  Patient Position (if appropriate) Lying  Pulse Rate 72  Pulse Rate Source Monitor  ECG Heart Rate 72  Resp (!) 21  Oxygen Therapy  SpO2 99 %  O2 Device Nasal Cannula  O2 Flow Rate (L/min) 2 L/min  Patient Activity (if Appropriate) In bed  Pulse Oximetry Type Continuous  During Treatment Monitoring  Blood Flow Rate (mL/min) 200 mL/min  HD Safety Checks Performed Yes  Intra-Hemodialysis Comments Tx completed  Post Treatment  Dialyzer Clearance Lightly streaked  Duration of HD Treatment -hour(s) 3.5 hour(s)  Hemodialysis Intake (mL) 0 mL  Liters Processed 84  Fluid Removed 2000 mL  Tolerated HD Treatment Yes  Post-Hemodialysis Comments tx completed. no complications.  AVG/AVF Arterial Site Held (minutes) 10 minutes  AVG/AVF Venous Site Held (minutes) 10 minutes  Fistula / Graft Left Upper arm Arteriovenous vein graft  No placement date or time found.   Placed prior to admission: Yes  Orientation: Left  Access Location: Upper arm  Access Type: Arteriovenous vein graft  Site Condition No complications  Fistula / Graft Assessment Present;Thrill;Bruit  Status Deaccessed  Needle Size 15  Drainage Description None

## 2022-05-11 NOTE — Progress Notes (Signed)
PT Cancellation Note  Patient Details Name: Cynthia Dean MRN: [id number] DOB: 1950-10-02   Cancelled Treatment:    Reason Eval/Treat Not Completed: Patient at procedure or test/unavailable Pt out of room for dialysis, will maintain on caseload.   Kreg Shropshire, DPT 05/11/2022, 1:44 PM

## 2022-05-11 NOTE — Progress Notes (Signed)
Patient [facility]  ROUNDING NOTE   Subjective:   Cynthia Dean is a 71 y.o. 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 Patient presents to ED with complaints of right leg pain and weakness. She has been admitted for Right leg pain [M79.604] ESRD on dialysis ([place]) [N18.6, Z99.2] Generalized weakness [R53.1] Unable to ambulate [R26.2] Diarrhea, unspecified type [R19.7]  Patient is known to our practice and receives outpatient dialysis treatments at [facility] on a MWF schedule, supervised by Dr Candiss Norse. Her last dialysis treatment was Friday and she denies missing any recent treatments. Patient reports pain in right leg began last week after receiving an MRI. She states her leg "squeezed" and she has had pain since then. Denies recent illness, nausea, vomiting or diarrhea. Denies shortness of breath or chest pain.   Labs on ED arrival unremarkable for renal patient. CK 29. Chest x-ray shows vascular congestion. Hip X ray negative for fracture.  We have been consulted to manage dialysis needs.   Patient was seen today on second floor Patient informed me today that her pain in the leg was better than yesterday    Objective:  Vital signs in last 24 hours:  Temp:  [98 F (36.7 C)-99.5 F (37.5 C)] 98 F (36.7 C) (10/18 1621) Pulse Rate:  [68-79] 77 (10/18 1621) Resp:  [16-22] 18 (10/18 1621) BP: (115-192)/(68-97) 115/92 (10/18 1621) SpO2:  [87 %-100 %] 94 % (10/18 1621) Weight:  [109.6 kg-114 kg] 109.6 kg (10/18 1408)  Weight change: -1.6 kg Filed Weights   05/11/22 0429 05/11/22 0953 05/11/22 1408  Weight: 114 kg 111.6 kg 109.6 kg    Intake/Output: I/O last 3 completed shifts: In: 120 [P.O.:120] Out: 2000 [Other:2000]   Intake/Output this shift:  No intake/output data recorded.  Physical Exam: General: NAD  Head:  Normocephalic, atraumatic. Moist oral mucosal membranes  Eyes: Anicteric  Lungs:  Diminished in bases, normal effort , [place] O2  Heart: Regular rate and rhythm  Abdomen:  Soft, nontender, obese  Extremities:  No peripheral edema.  Neurologic: Nonfocal, moving all four extremities  Skin: No lesions  Access: Lt upper AVG    Basic Metabolic Panel: Recent Labs  Lab 05/09/22 0054 05/10/22 0515 05/11/22 0940  NA 139 139 137  K 4.0 3.9 4.6  CL 106 102 100  CO2 '24 25 25  '$ GLUCOSE 164* 149* 131*  BUN 42* 27* 50*  CREATININE 9.38* 6.53* 8.81*  CALCIUM 8.6* 8.6* 8.6*  PHOS  --   --  6.5*    Liver Function Tests: Recent Labs  Lab 05/11/22 0940  ALBUMIN 2.7*   No results for input(s): "LIPASE", "AMYLASE" in the last 168 hours. No results for input(s): "AMMONIA" in the last 168 hours.  CBC: Recent Labs  Lab 05/09/22 0054 05/11/22 0940  WBC 10.2 8.7  NEUTROABS  --  6.0  HGB 8.4* 8.0*  HCT 27.4* 24.8*  MCV 89.8 85.8  PLT 254 221    Cardiac Enzymes: Recent Labs  Lab 05/09/22 0054  CKTOTAL 29*    BNP: Invalid input(s): "POCBNP"  CBG: Recent Labs  Lab 05/10/22 1317 05/10/22 1757 05/10/22 2112 05/11/22 0829 05/11/22 1622  GLUCAP 133* 133* 97 114* 128*    Microbiology: Results for orders placed or performed during the hospital encounter of 03/19/22  Resp Panel by RT-PCR (Flu A&B, Covid) Anterior Nasal Swab     Status: None  Collection Time: 03/19/22  9:28 PM   Specimen: Anterior Nasal Swab  Result Value Ref Range Status   SARS Coronavirus 2 by RT PCR NEGATIVE NEGATIVE Final    Comment: (NOTE) SARS-CoV-2 target nucleic acids are NOT DETECTED.  The SARS-CoV-2 RNA is generally detectable in upper respiratory specimens during the acute phase of infection. The lowest concentration of SARS-CoV-2 viral copies this assay can detect is 138 copies/mL. A negative result does not preclude SARS-Cov-2 infection and should not be used as the sole basis for treatment  or other patient management decisions. A negative result may occur with  improper specimen collection/handling, submission of specimen other than nasopharyngeal swab, presence of viral mutation(s) within the areas targeted by this assay, and inadequate number of viral copies(<138 copies/mL). A negative result must be combined with clinical observations, patient history, and epidemiological information. The expected result is Negative.  Fact Sheet for Patients:  EntrepreneurPulse.com.au  Fact Sheet for Healthcare Providers:  IncredibleEmployment.be  This test is no t yet approved or cleared by the [place] FDA and  has been authorized for detection and/or diagnosis of SARS-CoV-2 by FDA under an Emergency Use Authorization (EUA). This EUA will remain  in effect (meaning this test can be used) for the duration of the COVID-19 declaration under Section 564(b)(1) of the Act, 21 U.S.C.section 360bbb-3(b)(1), unless the authorization is terminated  or revoked sooner.       Influenza A by PCR NEGATIVE NEGATIVE Final   Influenza B by PCR NEGATIVE NEGATIVE Final    Comment: (NOTE) The Xpert Xpress SARS-CoV-2/FLU/RSV plus assay is intended as an aid in the diagnosis of influenza from Nasopharyngeal swab specimens and should not be used as a sole basis for treatment. Nasal washings and aspirates are unacceptable for Xpert Xpress SARS-CoV-2/FLU/RSV testing.  Fact Sheet for Patients: EntrepreneurPulse.com.au  Fact Sheet for Healthcare Providers: IncredibleEmployment.be  This test is not yet approved or cleared by the [place] FDA and has been authorized for detection and/or diagnosis of SARS-CoV-2 by FDA under an Emergency Use Authorization (EUA). This EUA will remain in effect (meaning this test can be used) for the duration of the COVID-19 declaration under Section 564(b)(1) of the Act, 21 U.S.C. section  360bbb-3(b)(1), unless the authorization is terminated or revoked.  Performed at [facility], [address] [place], [place] [address]   Blood Culture (routine x 2)     Status: Abnormal   Collection Time: 03/19/22  9:28 PM   Specimen: BLOOD  Result Value Ref Range Status   Specimen Description   Final    BLOOD RIGHT ANTECUBITAL Performed at [facility], [address] [place], [place] [address]    Special Requests   Final    BOTTLES DRAWN AEROBIC AND ANAEROBIC Blood Culture adequate volume Performed at [facility], [address] [place], [place] [address]    Culture  Setup Time   Final    GRAM POSITIVE COCCI IN BOTH AEROBIC AND ANAEROBIC BOTTLES CRITICAL RESULT CALLED TO, READ BACK BY AND VERIFIED WITH: CAROLYN CHILDS 03/20/22 1457 AMK Performed at [facility], [address] [place], [place] [address]    Culture STAPHYLOCOCCUS CAPITIS (A)  Final   Report Status 03/23/2022 FINAL  Final   Organism ID, Bacteria STAPHYLOCOCCUS CAPITIS  Final      Susceptibility   Staphylococcus capitis - MIC*    CIPROFLOXACIN <=0.5 SENSITIVE Sensitive     ERYTHROMYCIN <=0.25 SENSITIVE Sensitive     GENTAMICIN <=0.5 SENSITIVE Sensitive  OXACILLIN <=0.25 SENSITIVE Sensitive     TETRACYCLINE <=1 SENSITIVE Sensitive     VANCOMYCIN <=0.5 SENSITIVE Sensitive     TRIMETH/SULFA <=10 SENSITIVE Sensitive     CLINDAMYCIN <=0.25 SENSITIVE Sensitive     RIFAMPIN <=0.5 SENSITIVE Sensitive     Inducible Clindamycin NEGATIVE Sensitive     * STAPHYLOCOCCUS CAPITIS  Blood Culture ID Panel (Reflexed)     Status: Abnormal   Collection Time: 03/19/22  9:28 PM  Result Value Ref Range Status   Enterococcus faecalis NOT DETECTED NOT DETECTED Final   Enterococcus Faecium NOT DETECTED NOT DETECTED Final   Listeria monocytogenes NOT DETECTED NOT DETECTED Final   Staphylococcus species DETECTED (A) NOT DETECTED Final    Comment: CRITICAL RESULT CALLED TO, READ BACK  BY AND VERIFIED WITH: CAROLYN CHILDS 03/20/22 1457 AMK    Staphylococcus aureus (BCID) NOT DETECTED NOT DETECTED Final   Staphylococcus epidermidis NOT DETECTED NOT DETECTED Final   Staphylococcus lugdunensis NOT DETECTED NOT DETECTED Final   Streptococcus species NOT DETECTED NOT DETECTED Final   Streptococcus agalactiae NOT DETECTED NOT DETECTED Final   Streptococcus pneumoniae NOT DETECTED NOT DETECTED Final   Streptococcus pyogenes NOT DETECTED NOT DETECTED Final   A.calcoaceticus-baumannii NOT DETECTED NOT DETECTED Final   Bacteroides fragilis NOT DETECTED NOT DETECTED Final   Enterobacterales NOT DETECTED NOT DETECTED Final   Enterobacter cloacae complex NOT DETECTED NOT DETECTED Final   Escherichia coli NOT DETECTED NOT DETECTED Final   Klebsiella aerogenes NOT DETECTED NOT DETECTED Final   Klebsiella oxytoca NOT DETECTED NOT DETECTED Final   Klebsiella pneumoniae NOT DETECTED NOT DETECTED Final   Proteus species NOT DETECTED NOT DETECTED Final   Salmonella species NOT DETECTED NOT DETECTED Final   Serratia marcescens NOT DETECTED NOT DETECTED Final   Haemophilus influenzae NOT DETECTED NOT DETECTED Final   Neisseria meningitidis NOT DETECTED NOT DETECTED Final   Pseudomonas aeruginosa NOT DETECTED NOT DETECTED Final   Stenotrophomonas maltophilia NOT DETECTED NOT DETECTED Final   Candida albicans NOT DETECTED NOT DETECTED Final   Candida auris NOT DETECTED NOT DETECTED Final   Candida glabrata NOT DETECTED NOT DETECTED Final   Candida krusei NOT DETECTED NOT DETECTED Final   Candida parapsilosis NOT DETECTED NOT DETECTED Final   Candida tropicalis NOT DETECTED NOT DETECTED Final   Cryptococcus neoformans/gattii NOT DETECTED NOT DETECTED Final    Comment: Performed at [facility], [address] [place], [place] [address]  Blood Culture (routine x 2)     Status: Abnormal   Collection Time: 03/19/22 10:23 PM   Specimen: BLOOD  Result Value Ref Range Status    Specimen Description   Final    BLOOD BLOOD RIGHT FOREARM Performed at [facility], [address] [place], [place] [address]    Special Requests   Final    BOTTLES DRAWN AEROBIC AND ANAEROBIC Blood Culture adequate volume Performed at [facility], [address] [place], [place] [address]    Culture  Setup Time   Final    GRAM POSITIVE COCCI IN BOTH AEROBIC AND ANAEROBIC BOTTLES CRITICAL RESULT CALLED TO, READ BACK BY AND VERIFIED WITH: ELVERA MADUEME 03/20/22 1545 AMK Performed at [facility], [address] [place], [place] [address]    Culture (A)  Final    STAPHYLOCOCCUS CAPITIS SUSCEPTIBILITIES PERFORMED ON PREVIOUS CULTURE WITHIN THE LAST 5 DAYS. Performed at [facility], [place] [address], [place],  [address]    Report Status 03/22/2022 FINAL  Final  Urine Culture     Status: Abnormal   Collection Time: 03/20/22  1:25 AM   Specimen: In/Out Cath Urine  Result Value Ref Range Status   Specimen Description   Final    IN/OUT CATH URINE Performed at [facility], [address] [place], [place] [address]    Special Requests   Final    NONE Performed at [facility], [place], [place] [address]    Culture 4,000 COLONIES/mL STAPHYLOCOCCUS EPIDERMIDIS (A)  Final   Report Status 03/22/2022 FINAL  Final   Organism ID, Bacteria STAPHYLOCOCCUS EPIDERMIDIS (A)  Final      Susceptibility   Staphylococcus epidermidis - MIC*    CIPROFLOXACIN <=0.5 SENSITIVE Sensitive     GENTAMICIN <=0.5 SENSITIVE Sensitive     NITROFURANTOIN <=16 SENSITIVE Sensitive     OXACILLIN <=0.25 SENSITIVE Sensitive     TETRACYCLINE <=1 SENSITIVE Sensitive     VANCOMYCIN 2 SENSITIVE Sensitive     TRIMETH/SULFA 80 RESISTANT Resistant     CLINDAMYCIN >=8 RESISTANT Resistant     RIFAMPIN <=0.5 SENSITIVE Sensitive     Inducible Clindamycin NEGATIVE Sensitive     * 4,000 COLONIES/mL STAPHYLOCOCCUS EPIDERMIDIS  Culture, blood  (Routine X 2) w Reflex to ID Panel     Status: None   Collection Time: 03/21/22 11:00 AM   Specimen: BLOOD  Result Value Ref Range Status   Specimen Description BLOOD BLOOD RIGHT FOREARM  Final   Special Requests   Final    BOTTLES DRAWN AEROBIC AND ANAEROBIC Blood Culture adequate volume   Culture   Final    NO GROWTH 5 DAYS Performed at [facility], [address] [place], [place] [address]    Report Status 03/26/2022 FINAL  Final  Culture, blood (Routine X 2) w Reflex to ID Panel     Status: None   Collection Time: 03/22/22 11:53 AM   Specimen: BLOOD RIGHT HAND  Result Value Ref Range Status   Specimen Description BLOOD RIGHT HAND  Final   Special Requests   Final    BOTTLES DRAWN AEROBIC AND ANAEROBIC Blood Culture adequate volume   Culture   Final    NO GROWTH 5 DAYS Performed at [facility], [address] [place], [place] [address]    Report Status 03/27/2022 FINAL  Final  Aerobic/Anaerobic Culture w Gram Stain (surgical/deep wound)     Status: None   Collection Time: 03/25/22 10:30 AM   Specimen: Wound; Blood  Result Value Ref Range Status   Specimen Description WOUND LEFT BREAST  Final   Special Requests BLOOD HEMATOMA  Final   Gram Stain   Final    RARE WBC PRESENT, PREDOMINANTLY MONONUCLEAR NO ORGANISMS SEEN    Culture   Final    No growth aerobically or anaerobically. Performed at [facility], [place] [address] [place], [place] [address]    Report Status 03/30/2022 FINAL  Final    Coagulation Studies: No results for input(s): "LABPROT", "INR" in the last 72 hours.  Urinalysis: No results for input(s): "COLORURINE", "LABSPEC", "PHURINE", "GLUCOSEU", "HGBUR", "BILIRUBINUR", "KETONESUR", "PROTEINUR", "UROBILINOGEN", "NITRITE", "LEUKOCYTESUR" in the last 72 hours.  Invalid input(s): "APPERANCEUR"    Imaging: CT TIBIA FIBULA RIGHT WO CONTRAST  Result Date: 05/11/2022 CLINICAL DATA:  Foot pain, persistent posttraumatic. Right  leg pain with weakness and inability to ambulate. EXAM: CT OF THE LOWER RIGHT EXTREMITY WITHOUT CONTRAST TECHNIQUE: Multidetector CT imaging of the right lower leg was performed according  to the standard protocol. RADIATION DOSE REDUCTION: This exam was performed according to the departmental dose-optimization program which includes automated exposure control, adjustment of the mA and/or kV according to patient size and/or use of iterative reconstruction technique. COMPARISON:  Right hip and lower leg radiographs 05/09/2022. Doppler ultrasound 03/20/2022. FINDINGS: Bones/Joint/Cartilage Images extend from just above the right knee through the right ankle. The right foot is incompletely visualized. There is no evidence of acute fracture or dislocation. Moderate degenerative changes are present in the patellofemoral and medial compartments of the right knee. No significant knee joint effusion. No significant ankle joint effusion or arthropathy. Ligaments Suboptimally assessed by CT. Muscles and Tendons Mild generalized muscular atrophy. The patellar tendon is intact. The ankle tendons appear intact, although not all of their distal insertions are included. Soft tissues Prominent diffuse vascular calcifications. No focal fluid collection, foreign body or soft tissue emphysema. Minimal subcutaneous edema anteriorly in the distal lower leg. IMPRESSION: 1. No acute osseous findings identified in the right lower leg. 2. Moderate degenerative changes in the patellofemoral and medial compartments of the right knee. 3. Diffuse vascular calcifications. 4. Mild diffuse muscular atrophy. Electronically Signed   By: Richardean Sale M.D.   On: 05/11/2022 15:19     Medications:    anticoagulant sodium citrate      apixaban  5 mg Oral BID   aspirin EC  81 mg Oral Daily   Chlorhexidine Gluconate Cloth  6 each Topical Q0600   vitamin D3  1,000 Units Oral Daily   cinacalcet  30 mg Oral Q breakfast   gabapentin  300 mg Oral  BID   insulin aspart  0-5 Units Subcutaneous QHS   insulin aspart  0-9 Units Subcutaneous TID WC   levETIRAcetam  250 mg Oral Q M,W,F-1800   lidocaine  1 patch Transdermal Q24H   losartan  100 mg Oral QHS   metoprolol succinate  100 mg Oral Daily   pantoprazole  40 mg Oral Daily   sertraline  25 mg Oral Daily   torsemide  100 mg Oral Daily   traZODone  25 mg Oral QHS   acetaminophen, alteplase, anticoagulant sodium citrate, diphenoxylate-atropine, fentaNYL (SUBLIMAZE) injection, fluticasone, guaiFENesin, heparin, hydrALAZINE, ipratropium-albuterol, lidocaine (PF), lidocaine-prilocaine, LORazepam, metoprolol tartrate, ondansetron (ZOFRAN) IV, pentafluoroprop-tetrafluoroeth, senna-docusate, traZODone  Assessment/ Plan:  Cynthia Dean is a 71 y.o.  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 Patient presents to ED with complaints of right leg pain and weakness. She has been admitted for Right leg pain [M79.604] ESRD on dialysis ([place]) [N18.6, Z99.2] Generalized weakness [R53.1] Unable to ambulate [R26.2] Diarrhea, unspecified type [R19.7]  [facility]/MWF/Lt AVG  End stage renal disease on hemodialysis.        Patient is a Monday Wednesday Friday schedule       Patient will be dialyzed today   2. Anemia of chronic kidney disease, Normocytic Lab Results  Component Value Date   HGB 8.0 (L) 05/11/2022    Hgb below target. Patient receives [place] outpatient. Will continue to monitor and assess need for ESA.   3. Secondary Hyperparathyroidism: with outpatient labs: PTH 613, phosphorus 4.8, calcium 9.0 on 03/07/22.    Lab Results  Component Value Date   PTH 131 (H) 03/14/2018   CALCIUM 8.6 (L) 05/11/2022   PHOS 6.5 (H) 05/11/2022     Calcium and phosphorus within desired range.   4.  Hypertension with chronic kidney disease. Home  regimen includes losartan, metoprolol, and torsemide. All held  5. Diabetes mellitus type II with chronic kidney disease: insulin dependent.. Patient is being followed by the primary team    6.Inability to ambulate secondary to right leg pain. Primary team is following   LOS: 2 [facility] 10/18/20237:02 PM

## 2022-05-11 NOTE — Consult Note (Addendum)
ORTHOPAEDIC CONSULTATION  REQUESTING PHYSICIAN: Damita Lack, MD  Chief Complaint: Right leg pain  HPI: Cynthia Dean is a 71 y.o. female with end-stage renal disease requiring hemodialysis, diabetes, CAD, seizures, dCHF, previous stroke and pulmonary embolism on Eliquis, who complains of progressively worsening right leg pain.  Patient is seen in her hospital room this evening.  She states that he has had right leg pain for 3 months following chemotherapy for breast cancer.  Pain became acutely worse on the day of admission 05/09/22.  Patient states the pain is over the anterior right leg.  She denies pain in the left lower extremity.  Pain extends from approximately the tibial tubercle into the right foot.  The severity of her pain has impaired her ability to ambulate.  She had a duplex ultrasound which was negative for DVT on 05/06/2022.  Patient does have pain in the lower back, which was demonstrated when she raised the head of her bed during my exam.  Past Medical History:  Diagnosis Date   Acute on chronic respiratory failure with hypoxia ([place]) 07/30/2019   Acute respiratory failure with hypoxia ([place]) 12/31/2018   Carotid arterial disease ([place])    a. 02/2018 < bilat ICA stenosis.   COPD (chronic obstructive pulmonary disease) (HCC)    Coronary artery disease    a. 11/2015 MV: EF 57%, no ischemia/infarct.   Cryptogenic stroke ([place])    a. 10/2017 s/p implantable loop recorder (no Afib to date).   Diabetes mellitus without complication ([place])    Diarrhea    ESRD on dialysis [facility])    GI bleed    a. 01/2019 EGD/Colonoscopy: Mild gastritis. Colitis.   Heart murmur    a. 12/2018 Echo: EF 55-60%, impaired relaxation. Mod dil LA. Mildly dil RA. Mild Ca2+ of AoV.   Hematemesis 06/27/2017   History of 2019 novel coronavirus disease (COVID-19) 07/30/2019   Hyperkalemia    Hyperlipidemia    Hypertension    Intractable nausea and vomiting 08/08/2015   Nausea & vomiting 05/30/2018    Nausea and vomiting 05/29/2018   Pneumonia due to COVID-19 virus    Past Surgical History:  Procedure Laterality Date   A/V FISTULAGRAM Left 12/20/2016   Procedure: A/V Fistulagram;  Surgeon: Katha Cabal, MD;  Location: [place] [facility];  Service: Cardiovascular;  Laterality: Left;   A/V SHUNT INTERVENTION N/A 12/20/2016   Procedure: A/V Shunt Intervention;  Surgeon: Katha Cabal, MD;  Location: [place] [facility];  Service: Cardiovascular;  Laterality: N/A;   A/V SHUNTOGRAM Left 09/11/2017   Procedure: A/V SHUNTOGRAM;  Surgeon: Algernon Huxley, MD;  Location: [place] [facility];  Service: Cardiovascular;  Laterality: Left;   A/V SHUNTOGRAM Left 11/21/2019   Procedure: A/V SHUNTOGRAM;  Surgeon: Algernon Huxley, MD;  Location: [place] [facility];  Service: Cardiovascular;  Laterality: Left;   BREAST BIOPSY Bilateral 07/19/2000   neg   BREAST BIOPSY Left 11/03/2021   [place] bx mass at 3:00, venus marker, axilla bx-hyrdo marker, path pending   COLONOSCOPY WITH PROPOFOL N/A 02/16/2019   Procedure: COLONOSCOPY WITH PROPOFOL;  Surgeon: Toledo, Benay Pike, MD;  Location: [facility];  Service: Gastroenterology;  Laterality: N/A;   ESOPHAGOGASTRODUODENOSCOPY (EGD) WITH PROPOFOL N/A 02/16/2019   Procedure: ESOPHAGOGASTRODUODENOSCOPY (EGD) WITH PROPOFOL;  Surgeon: Toledo, Benay Pike, MD;  Location: [facility];  Service: Gastroenterology;  Laterality: N/A;   IR US GUIDE BX ASP/DRAIN  03/25/2022   LOOP RECORDER INSERTION N/A 11/16/2017   Procedure: LOOP RECORDER  INSERTION;  Surgeon: Deboraha Sprang, MD;  Location: [place] [facility];  Service: Cardiovascular;  Laterality: N/A;   PERIPHERAL VASCULAR CATHETERIZATION Left 02/02/2015   Procedure: A/V Shuntogram/Fistulagram;  Surgeon: Algernon Huxley, MD;  Location: [place] [facility];  Service: Cardiovascular;  Laterality: Left;   PERIPHERAL VASCULAR CATHETERIZATION Left 02/02/2015   Procedure: A/V Shunt Intervention;  Surgeon: Algernon Huxley,  MD;  Location: [place] [facility];  Service: Cardiovascular;  Laterality: Left;   PERIPHERAL VASCULAR CATHETERIZATION Left 03/09/2015   Procedure: A/V Shuntogram/Fistulagram;  Surgeon: Algernon Huxley, MD;  Location: [place] [facility];  Service: Cardiovascular;  Laterality: Left;   PERIPHERAL VASCULAR CATHETERIZATION N/A 03/09/2015   Procedure: A/V Shunt Intervention;  Surgeon: Algernon Huxley, MD;  Location: [place] [facility];  Service: Cardiovascular;  Laterality: N/A;   PERIPHERAL VASCULAR CATHETERIZATION Left 04/17/2015   Procedure: Upper Extremity Angiography;  Surgeon: Algernon Huxley, MD;  Location: [place] [facility];  Service: Cardiovascular;  Laterality: Left;   PERIPHERAL VASCULAR CATHETERIZATION  04/17/2015   Procedure: Upper Extremity Intervention;  Surgeon: Algernon Huxley, MD;  Location: [place] [facility];  Service: Cardiovascular;;   PERIPHERAL VASCULAR CATHETERIZATION N/A 08/10/2015   Procedure: A/V Shuntogram/Fistulagram;  Surgeon: Algernon Huxley, MD;  Location: [place] [facility];  Service: Cardiovascular;  Laterality: N/A;   PERIPHERAL VASCULAR CATHETERIZATION N/A 08/10/2015   Procedure: A/V Shunt Intervention;  Surgeon: Algernon Huxley, MD;  Location: [place] [facility];  Service: Cardiovascular;  Laterality: N/A;   PERIPHERAL VASCULAR CATHETERIZATION Left 04/11/2016   Procedure: A/V Shuntogram/Fistulagram;  Surgeon: Algernon Huxley, MD;  Location: [place] [facility];  Service: Cardiovascular;  Laterality: Left;   TEE WITHOUT CARDIOVERSION N/A 11/16/2017   Procedure: TRANSESOPHAGEAL ECHOCARDIOGRAM (TEE);  Surgeon: Minna Merritts, MD;  Location: [facility];  Service: Cardiovascular;  Laterality: N/A;   Social History   Socioeconomic History   Marital status: Single    Spouse name: Not on file   Number of children: 5   Years of education: Not on file   Highest education level: Not on file  Occupational History   Occupation: Disability   Tobacco Use   Smoking status: Former     Packs/day: 0.00    Types: Cigarettes   Smokeless tobacco: Never  Vaping Use   Vaping Use: Never used  Substance and Sexual Activity   Alcohol use: No   Drug use: No   Sexual activity: Not Currently  Other Topics Concern   Not on file  Social History Narrative   Live with Fiance   Social Determinants of Health   Financial Resource Strain: Low Risk  (11/12/2017)   Overall Financial Resource Strain (CARDIA)    Difficulty of Paying Living Expenses: Not hard at all  Food Insecurity: No Food Insecurity (03/31/2022)   Hunger Vital Sign    Worried About Running Out of Food in the Last Year: Never true    [place] in the Last Year: Never true  Transportation Needs: No Transportation Needs (03/31/2022)   PRAPARE - Hydrologist (Medical): No    Lack of Transportation (Non-Medical): No  Physical Activity: Insufficiently Active (11/12/2017)   Exercise Vital Sign    Days of Exercise per Week: 5 days    Minutes of Exercise per Session: 20 min  Stress: No Stress Concern Present (11/12/2017)   [place]    Feeling of  Stress : Not at all  Social Connections: Moderately Integrated (11/12/2017)   Social Connection and Isolation Panel [NHANES]    Frequency of Communication with Friends and Family: Twice a week    Frequency of Social Gatherings with Friends and Family: Twice a week    Attends Religious Services: More than 4 times per year    Active Member of [employer] or Organizations: Yes    Attends Music therapist: More than 4 times per year    Marital Status: Never married   Family History  Problem Relation Age of Onset   Breast cancer Father 74   Stroke Mother    Heart attack Mother    Heart Problems Sister    Breast cancer Cousin 104       1 st cousin. Maternal    Allergies  Allergen Reactions   Oxycodone Nausea And Vomiting   Oxycodone-Acetaminophen Other (See Comments)    Wound Dressing Adhesive    Hydrocodone     Intolerant more than allergic   Tape Itching    Skin Dermatitis/itching (tape adhesive) Skin Dermatitis/itching (tape adhesive)   Tapentadol Itching    Skin Dermatitis/itching (tape adhesive) Skin Dermatitis/itching (tape adhesive)    Prior to Admission medications   Medication Sig Start Date End Date Taking? Authorizing Provider  apixaban (ELIQUIS) 5 MG TABS tablet Take 1 tablet (5 mg total) by mouth 2 (two) times daily. Only start this script after you have completed eliquis 10 mg BID x 1 day. 04/03/22 05/09/22 Yes Wyvonnia Dusky, MD  aspirin EC 81 MG tablet Take 81 mg by mouth daily. Swallow whole.   Yes [provider]  atorvastatin (LIPITOR) 80 MG tablet Take 80 mg by mouth every evening.    Yes [provider]  cholecalciferol (VITAMIN D) 25 MCG tablet Take 1 tablet (1,000 Units total) by mouth daily. 08/11/19  Yes Ezekiel Slocumb, DO  cinacalcet (SENSIPAR) 30 MG tablet Take 30 mg by mouth daily with breakfast.   Yes [provider]  fluticasone (FLONASE) 50 MCG/ACT nasal spray Place 1 spray into both nostrils daily.   Yes [provider]  Ipratropium-Albuterol (COMBIVENT) 20-100 MCG/ACT AERS respimat Inhale 1 puff into the lungs every 6 (six) hours. Patient taking differently: Inhale 1 puff into the lungs 2 (two) times daily. 08/11/19  Yes Nicole Kindred A, DO  levETIRAcetam (KEPPRA) 1000 MG tablet Take 1 tablet (1,000 mg total) by mouth daily. 03/15/18  Yes Vaughan Basta, MD  levETIRAcetam (KEPPRA) 250 MG tablet Take 250 mg by mouth 3 (three) times a week. 01/16/20  Yes [provider]  losartan (COZAAR) 100 MG tablet Take 100 mg by mouth at bedtime. 02/07/22  Yes [provider]  metoprolol succinate (TOPROL-XL) 100 MG 24 hr tablet Take 100 mg by mouth daily. 05/02/22  Yes [provider]  NOVOLIN 70/30 (70-30) 100 UNIT/ML injection SMARTSIG:28 Unit(s) SUB-Q Twice  Daily 10/12/21  Yes [provider]  omeprazole (PRILOSEC) 20 MG capsule Take 20 mg by mouth 2 (two) times daily.    Yes [provider]  sertraline (ZOLOFT) 25 MG tablet Take 25 mg by mouth daily. 04/20/22  Yes [provider]  torsemide (DEMADEX) 100 MG tablet Take 100 mg by mouth daily. 05/03/22  Yes [provider]  traZODone (DESYREL) 50 MG tablet Take 25 mg by mouth at bedtime. 04/20/22  Yes [provider]  acetaminophen (TYLENOL) 500 MG tablet Take 500 mg by mouth every 6 (six) hours as needed.  [provider]  diphenoxylate-atropine (LOMOTIL) 2.5-0.025 MG tablet SMARTSIG:1 Tablet(s) By Mouth Every 12 Hours PRN 01/06/22   [provider]  glucose 4 GM chewable tablet Chew 1 tablet by mouth as needed for low blood sugar.    [provider]  lidocaine-prilocaine (EMLA) cream Apply 1 application topically as needed (for port access).     [provider]  loperamide (IMODIUM) 2 MG capsule Take 2 mg by mouth 4 (four) times daily as needed. 03/07/22   [provider]   CT TIBIA FIBULA RIGHT WO CONTRAST  Result Date: 05/11/2022 CLINICAL DATA:  Foot pain, persistent posttraumatic. Right leg pain with weakness and inability to ambulate. EXAM: CT OF THE LOWER RIGHT EXTREMITY WITHOUT CONTRAST TECHNIQUE: Multidetector CT imaging of the right lower leg was performed according to the standard protocol. RADIATION DOSE REDUCTION: This exam was performed according to the departmental dose-optimization program which includes automated exposure control, adjustment of the mA and/or kV according to patient size and/or use of iterative reconstruction technique. COMPARISON:  Right hip and lower leg radiographs 05/09/2022. Doppler ultrasound 03/20/2022. FINDINGS: Bones/Joint/Cartilage Images extend from just above the right knee through the right ankle. The right foot is incompletely visualized. There is no evidence of acute  fracture or dislocation. Moderate degenerative changes are present in the patellofemoral and medial compartments of the right knee. No significant knee joint effusion. No significant ankle joint effusion or arthropathy. Ligaments Suboptimally assessed by CT. Muscles and Tendons Mild generalized muscular atrophy. The patellar tendon is intact. The ankle tendons appear intact, although not all of their distal insertions are included. Soft tissues Prominent diffuse vascular calcifications. No focal fluid collection, foreign body or soft tissue emphysema. Minimal subcutaneous edema anteriorly in the distal lower leg. IMPRESSION: 1. No acute osseous findings identified in the right lower leg. 2. Moderate degenerative changes in the patellofemoral and medial compartments of the right knee. 3. Diffuse vascular calcifications. 4. Mild diffuse muscular atrophy. Electronically Signed   By: Richardean Sale M.D.   On: 05/11/2022 15:19    Positive ROS: All other systems have been reviewed and were otherwise negative with the exception of those mentioned in the HPI and as above.  Physical Exam: General: Alert, no acute distress  MUSCULOSKELETAL: Right lower extremity: Patient's skin is intact throughout the right lower extremity.  She had tenderness to palpation between the tibial tubercle and right ankle.  She had palpable pedal pulses.  Patient could not flex or extend her toes or dorsiflex and plantarflex her ankle.  She had no trouble with these motions in the left lower extremity.  Her bilateral lower extremity compartments were soft and compressible.  She seemed to have decreased sensation to light touch in the right foot compared to the left.  Assessment: Right lower leg pain likely secondary to lumbar radiculopathy  Plan: Ms. Haye admitted to having lumbar back pain.  This was exhibited when she went to raise the head of her bed during my exam.  She is having pain in the right lower leg but no pain in the  right knee.  She demonstrated the inability to dorsiflex her right ankle.  She seemed to aslo have diminished sensation light touch in the right foot compared to the left on exam as well.  A CT scan of her right leg performed today demonstrated no acute osseous findings.  There was moderate degenerative changes in her patellofemoral and medial compartments of the right knee along with diffuse vascular calcifications and mild  diffuse muscular atrophy on her CT.    I would recommend further evaluation of her lumbar spine for possible nerve root compression with an MRI.  Patient had an implantable loop recorder in April 2019.  Hopeful this will not preclude her ability to get an MRI.  If an MRI is not possible, a CT scan of the lumbar spine would be ordered instead.  Patient has significant pain in her right lower extremity.  She has hydrocodone and oxycodone listed as sensitivities on her allergies.  I am going to order oral Dilaudid for her leg pain.  I will follow up on her MRI results once completed.    [place], MD    05/11/2022 7:26 PM

## 2022-05-11 NOTE — NC FL2 (Signed)
[place] LEVEL OF CARE SCREENING TOOL     IDENTIFICATION  Patient Name: Cynthia Dean Birthdate: September 05, 1950 Sex: female Admission Date (Current Location): 05/09/2022  [facility] and [place] Number:  Engineering geologist and Address:         Provider Number: [id number]  Attending Physician Name and Address:  Damita Lack, MD  Relative Name and Phone Number:       Current Level of Care: Hospital Recommended Level of Care: [place] Prior Approval Number:    Date Approved/Denied:   PASRR Number: [id number]  Discharge Plan: SNF    Current Diagnoses: Patient Active Problem List   Diagnosis Date Noted   Unable to ambulate 05/09/2022   ESRD on dialysis [facility])    Right leg pain    Depression    Pulmonary emboli (HCC)    Obesity with body mass index (BMI) of 30.0 to 39.9    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-stage renal disease) (HCC)    Chronic diastolic CHF (congestive heart failure) (HCC)    Seroma of breast    Bacteremia due to coagulase-negative Staphylococcus    Left breast abscess    Urinary tract infection 03/22/2022   Anemia of chronic disease 03/22/2022   Acute pulmonary embolism without acute cor pulmonale ([place]) 03/20/2022   Dyslipidemia 03/20/2022   Diabetic neuropathy ([place]) 03/20/2022   Sepsis due to cellulitis ([place]) 03/20/2022   Delay kidney tx func d/t fluid overload requiring acute dialysis ([place]) 01/29/2020   Delay kidney tx func d/t ATN and fluid overload require acute dialysis ([place]) 01/29/2020   Hypoglycemia 10/21/2019   Unresponsiveness 10/21/2019   GERD (gastroesophageal reflux disease) 10/21/2019   Hypothermia    Sepsis with encephalopathy without septic shock (HCC)    Diarrhea    Acute metabolic encephalopathy    Acute on chronic diastolic CHF (congestive heart failure) (HCC)    Acute on chronic respiratory failure with hypoxia ([place]) 07/30/2019   Rectal bleed 02/13/2019   Acute respiratory failure with hypoxia  ([place]) 12/31/2018   Aphasia as late effect of cerebrovascular accident (CVA) 05/01/2018   Seizure ([place]) 03/14/2018   Stroke ([place]) 03/07/2018   Slurred speech 11/14/2017   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[place]) 11/12/2017   CAD (coronary artery disease) 11/12/2017   COPD (chronic obstructive pulmonary disease) ([place]) 11/12/2017   ESRD on hemodialysis ([place]) 11/12/2017   CVA (cerebral vascular accident) ([place]) 04/14/2016   Essential hypertension 11/27/2015   Hyperlipidemia 11/27/2015   Prolonged Q-T interval on ECG 64/40/3474   Aphasia complicating stroke 25/95/6387   Cervical spine arthritis 07/27/2015   Diabetic retinopathy without macular edema associated with type 2 diabetes mellitus ([place]) 07/27/2015   Osteoarthritis of spine with radiculopathy, lumbar region 07/27/2015   Obesity, Class III, BMI 40-49.9 (morbid obesity) ([place]) 07/27/2015   Vitamin D deficiency 56/43/3295   Diastolic heart failure ([place]) 10/27/2010    Orientation RESPIRATION BLADDER Height & Weight     Self, Place  O2 (2L) Incontinent Weight: 109.6 kg Height:  '5\' 9"'$  (175.3 cm)  BEHAVIORAL SYMPTOMS/MOOD NEUROLOGICAL BOWEL NUTRITION STATUS      Incontinent Diet (Renal - fluid restriction 1200)  AMBULATORY STATUS COMMUNICATION OF NEEDS Skin   Extensive Assist Verbally Bruising                       Personal Care Assistance Level of Assistance              Functional Limitations  Info             SPECIAL CARE FACTORS FREQUENCY  PT (By licensed PT), OT (By licensed OT)                    Contractures Contractures Info: Not present    Additional Factors Info  Code Status, Allergies, Isolation Precautions Code Status Info: full Allergies Info: Oxycodone, Oxycodone-acetaminophen, Wound Dressing Adhesive, Hydrocodone, Tape, Tapentadol     Isolation Precautions Info: enteric     Current Medications (05/11/2022):  This is the current hospital active medication  list Current Facility-Administered Medications  Medication Dose Route Frequency Provider Last Rate Last Admin   acetaminophen (TYLENOL) tablet 650 mg  650 mg Oral Q6H PRN Ivor Costa, MD   650 mg at 05/10/22 2240   alteplase (CATHFLO ACTIVASE) injection 2 mg  2 mg Intracatheter Once PRN Colon Flattery, NP       anticoagulant sodium citrate solution 5 mL  5 mL Intracatheter PRN Colon Flattery, NP       apixaban (ELIQUIS) tablet 5 mg  5 mg Oral BID Ivor Costa, MD   5 mg at 05/11/22 4081   aspirin EC tablet 81 mg  81 mg Oral Daily Ivor Costa, MD   81 mg at 05/11/22 0855   Chlorhexidine Gluconate Cloth 2 % PADS 6 each  6 each Topical Q0600 Colon Flattery, NP   6 each at 05/11/22 4481   cholecalciferol (VITAMIN D3) 25 MCG (1000 UNIT) tablet 1,000 Units  1,000 Units Oral Daily Ivor Costa, MD   1,000 Units at 05/11/22 0854   cinacalcet (SENSIPAR) tablet 30 mg  30 mg Oral Q breakfast Ivor Costa, MD   30 mg at 05/11/22 0900   diphenoxylate-atropine (LOMOTIL) 2.5-0.025 MG per tablet 1 tablet  1 tablet Oral QID PRN Ivor Costa, MD       fentaNYL (SUBLIMAZE) injection 12.5 mcg  12.5 mcg Intravenous Q2H PRN Ivor Costa, MD   12.5 mcg at 05/09/22 1614   fluticasone (FLONASE) 50 MCG/ACT nasal spray 1 spray  1 spray Each Nare Daily PRN Ivor Costa, MD       gabapentin (NEURONTIN) tablet 300 mg  300 mg Oral BID Ivor Costa, MD   300 mg at 05/11/22 0854   guaiFENesin (ROBITUSSIN) 100 MG/5ML liquid 5 mL  5 mL Oral Q4H PRN Amin, Ankit Chirag, MD   5 mL at 05/11/22 0854   heparin injection 1,000 Units  1,000 Units Intracatheter PRN Colon Flattery, NP       hydrALAZINE (APRESOLINE) injection 10 mg  10 mg Intravenous Q4H PRN Amin, Ankit Chirag, MD       insulin aspart (novoLOG) injection 0-5 Units  0-5 Units Subcutaneous QHS Ivor Costa, MD       insulin aspart (novoLOG) injection 0-9 Units  0-9 Units Subcutaneous TID WC Ivor Costa, MD   1 Units at 05/10/22 1813   ipratropium-albuterol (DUONEB) 0.5-2.5 (3) MG/3ML  nebulizer solution 3 mL  3 mL Inhalation Q6H PRN Emeterio Reeve, DO       levETIRAcetam (KEPPRA) tablet 250 mg  250 mg Oral Q M,W,F-1800 Ivor Costa, MD   250 mg at 05/09/22 2056   lidocaine (LIDODERM) 5 % 1 patch  1 patch Transdermal Q24H Ivor Costa, MD   1 patch at 05/11/22 0853   lidocaine (PF) (XYLOCAINE) 1 % injection 5 mL  5 mL Intradermal PRN Colon Flattery, NP       lidocaine-prilocaine (EMLA) cream 1 Application  1  Application Topical PRN Colon Flattery, NP       LORazepam (ATIVAN) injection 1 mg  1 mg Intravenous Q2H PRN Ivor Costa, MD       losartan (COZAAR) tablet 100 mg  100 mg Oral QHS Ivor Costa, MD   100 mg at 05/10/22 2240   metoprolol succinate (TOPROL-XL) 24 hr tablet 100 mg  100 mg Oral Daily Ivor Costa, MD   100 mg at 05/11/22 0900   metoprolol tartrate (LOPRESSOR) injection 5 mg  5 mg Intravenous Q4H PRN Amin, Ankit Chirag, MD       ondansetron (ZOFRAN) injection 4 mg  4 mg Intravenous Q8H PRN Ivor Costa, MD       pantoprazole (PROTONIX) EC tablet 40 mg  40 mg Oral Daily Ivor Costa, MD   40 mg at 05/11/22 0900   pentafluoroprop-tetrafluoroeth (GEBAUERS) aerosol 1 Application  1 Application Topical PRN Colon Flattery, NP       senna-docusate (Senokot-S) tablet 1 tablet  1 tablet Oral QHS PRN Amin, Ankit Chirag, MD       sertraline (ZOLOFT) tablet 25 mg  25 mg Oral Daily Ivor Costa, MD   25 mg at 05/11/22 0901   torsemide (DEMADEX) tablet 100 mg  100 mg Oral Daily Ivor Costa, MD   100 mg at 05/11/22 0900   traZODone (DESYREL) tablet 25 mg  25 mg Oral QHS Ivor Costa, MD   25 mg at 05/10/22 2241   traZODone (DESYREL) tablet 50 mg  50 mg Oral QHS PRN Damita Lack, MD         Discharge Medications: Please see discharge summary for a list of discharge medications.  Relevant Imaging Results:  Relevant Lab Results:   Additional Information SS#: [id number]. HD MWF [place] 6:00 am.  [name], RN

## 2022-05-11 NOTE — TOC Progression Note (Signed)
Transition of Care [facility]) - Progression Note    Patient Details  Name: Cynthia Dean MRN: [id number] Date of Birth: 10/03/1950  [facility]) CM/SW Contact  [name], RN Phone Number: 05/11/2022, 3:26 PM  Clinical Narrative:      Therapy recommending SNF.  Spoke with daughter [name].  She is in agreement to bed search and states she will update her sister Joelene Millin.   Existing PASSR Fl2 sent for signature Bed Search initiated  [facility]st preference [facility].  Message sent to [facility] at [facility] to request review.  [name] request that bed offers be presented to daughter Joelene Millin   Expected Discharge Plan:  (TBD) Barriers to Discharge: Continued Medical Work up  Expected Discharge Plan and Services Expected Discharge Plan:  (TBD)       Living arrangements for the past 2 months: Single Family Home                                       Social Determinants of Health (SDOH) Interventions    Readmission Risk Interventions    10/25/2019    8:43 AM 10/24/2019   10:14 AM  Readmission Risk Prevention Plan  Transportation Screening Complete   PCP or Specialist Appt within 3-5 Days Not Complete   HRI or Home Care Consult Complete Complete  Palliative Care Screening Not Applicable   Medication Review (RN Care Manager) Complete

## 2022-05-11 NOTE — Progress Notes (Signed)
Patient off the unit via bed for hemodialysis at this time.

## 2022-05-11 NOTE — Progress Notes (Signed)
PROGRESS NOTE    Cynthia Dean  BWG:[id number] DOB: August 21, 1950 DOA: 05/09/2022 PCP: Lowella Bandy, MD   Brief Narrative:  71 year old with history of ESRD on HD MWF, HTN, HLD, DM 2, CVA, depression, CAD, PE on Eliquis, seizure, diastolic CHF, anemia, obesity admitted for right leg pain, weakness and unable to ambulate.  Apparently some of the symptoms have been going on for about 3 months.  Negative right lower extremity Doppler on 10/13.  PT/OT recommending rehab.   Assessment & Plan:  Principal Problem:   Unable to ambulate Active Problems:   Right leg pain   ESRD on dialysis ([place])   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-stage renal disease) (HCC)   CAD (coronary artery disease)   Chronic diastolic CHF (congestive heart failure) (HCC)   Essential hypertension   COPD (chronic obstructive pulmonary disease) (HCC)   Depression   Dyslipidemia   Pulmonary emboli (HCC)   Seizure (HCC)   Stroke (HCC)   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[place])   Obesity with body mass index (BMI) of 30.0 to 39.9   Diarrhea    * Unable to ambulate; Right LE Pain -Secondary to severe pain in the right leg.  X-ray negative, lower extremity Dopplers is also negative for DVT.  Does have good pulses.  PT/OT recommending SNF.  Currently Lipitor is on hold.  Neurontin 20 mg twice daily started. CK - 29 Due to persistence of severe pain, CT scan right lower extremity    ESRD on dialysis [facility]) MWF -consulted Dr. Theador Hawthorne of renal for HD   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-stage renal disease) ([place]) Hemoglobin stable 8.4 (7.8 on 04/11/2022).  Sensipar -Follow-up with CBC   CAD (coronary artery disease) No chest pain, on aspirin and Eliquis.  Statin on hold   Chronic diastolic CHF (congestive heart failure) ([place]) Patient does not have leg edema.  CHF is compensated.  2D echo 03/22/2022 showed EF of 50 to 55% with grade 2 diastolic dysfunction.  Volume status will be managed with HD.  On daily  torsemide   Essential hypertension -Cozaar, Toprol-XL.  Daily torsemide IV as needed ordered     COPD (chronic obstructive pulmonary disease) (HCC) Stable - Bronchodilators   Depression - Continue home medications   Dyslipidemia - Hold Lipitor as above   Pulmonary emboli (HCC) - Continue Eliquis   Seizure (HCC) -Keppra     Stroke ([place]) -On aspirin, Eliquis and statin     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) Recent A1c 5.  8, well controlled.  Patient's not taking 70/30 insulin currently. -Sliding scale insulin   Obesity with body mass index (BMI) of 30.0 to 39.9   BMI= 37.02,   and BW= 113.7Kg -Diet and exercise.   -Encourage to lose weight.     Diarrhea - Check C. difficile PCR- pending collection.     PT/OT= SNF   DVT prophylaxis:  apixaban (ELIQUIS) tablet 5 mg  Code Status: Full Family Communication:    Status is: Inpatient Remains inpatient appropriate because: Maintain hospital stay for CT scan of right lower extremity       Subjective: Seen and examined in hemodialysis.  Patient still reporting of significant right shin and knee pain Denies any recent trauma  Examination:  General exam: Appears calm and comfortable  Respiratory system: Clear to auscultation. Respiratory effort normal. Cardiovascular system: S1 & S2 heard, RRR. No JVD, murmurs, rubs, gallops or clicks. No pedal edema. Gastrointestinal system: Abdomen  is nondistended, soft and nontender. No organomegaly or masses felt. Normal bowel sounds heard. Central nervous system: Alert and oriented. No focal neurological deficits. Extremities: Right lower extremity tenderness especially in the anterior shin/lower extremity.  Also knee slightly tender to palpation but no obvious swelling noted Skin: No rashes, lesions or ulcers Psychiatry: Judgement and insight appear normal. Mood & affect appropriate.     Objective: Vitals:   05/10/22 1730 05/10/22  2053 05/11/22 0429 05/11/22 0452  BP: (!) 175/72 (!) 165/73  (!) 165/71  Pulse: 70 72  70  Resp: '19 18  16  '$ Temp: 98.9 F (37.2 C) 99.5 F (37.5 C)    TempSrc: Oral Oral    SpO2: 98% 98%  99%  Weight:   114 kg   Height:       No intake or output data in the 24 hours ending 05/11/22 0824 Filed Weights   05/09/22 1149 05/09/22 1539 05/11/22 0429  Weight: 115.6 kg 113.7 kg 114 kg     Data Reviewed:   CBC: Recent Labs  Lab 05/09/22 0054  WBC 10.2  HGB 8.4*  HCT 27.4*  MCV 89.8  PLT 979   Basic Metabolic Panel: Recent Labs  Lab 05/09/22 0054 05/10/22 0515  NA 139 139  K 4.0 3.9  CL 106 102  CO2 24 25  GLUCOSE 164* 149*  BUN 42* 27*  CREATININE 9.38* 6.53*  CALCIUM 8.6* 8.6*   GFR: Estimated Creatinine Clearance: 10.6 mL/min (A) (by C-G formula based on SCr of 6.53 mg/dL (H)). Liver Function Tests: No results for input(s): "AST", "ALT", "ALKPHOS", "BILITOT", "PROT", "ALBUMIN" in the last 168 hours. No results for input(s): "LIPASE", "AMYLASE" in the last 168 hours. No results for input(s): "AMMONIA" in the last 168 hours. Coagulation Profile: No results for input(s): "INR", "PROTIME" in the last 168 hours. Cardiac Enzymes: Recent Labs  Lab 05/09/22 0054  CKTOTAL 29*   BNP (last 3 results) No results for input(s): "PROBNP" in the last 8760 hours. HbA1C: No results for input(s): "HGBA1C" in the last 72 hours. CBG: Recent Labs  Lab 05/09/22 2039 05/10/22 0846 05/10/22 1317 05/10/22 1757 05/10/22 2112  GLUCAP 158* 144* 133* 133* 97   Lipid Profile: No results for input(s): "CHOL", "HDL", "LDLCALC", "TRIG", "CHOLHDL", "LDLDIRECT" in the last 72 hours. Thyroid Function Tests: No results for input(s): "TSH", "T4TOTAL", "FREET4", "T3FREE", "THYROIDAB" in the last 72 hours. Anemia Panel: No results for input(s): "VITAMINB12", "FOLATE", "FERRITIN", "TIBC", "IRON", "RETICCTPCT" in the last 72 hours. Sepsis Labs: No results for input(s): "PROCALCITON",  "LATICACIDVEN" in the last 168 hours.  No results found for this or any previous visit (from the past 240 hour(s)).       Radiology Studies: DG Tibia/Fibula Right  Result Date: 05/09/2022 CLINICAL DATA:  Right lower leg pain. EXAM: RIGHT TIBIA AND FIBULA - 2 VIEW COMPARISON:  None Available. FINDINGS: No fracture, dislocation, bony lesion or bony destruction identified. Extensive popliteal and tibial arterial vascular calcifications noted. IMPRESSION: No acute findings in the right lower leg. Extensive arterial vascular calcifications. Electronically Signed   By: Aletta Edouard M.D.   On: 05/09/2022 16:35        Scheduled Meds:  apixaban  5 mg Oral BID   aspirin EC  81 mg Oral Daily   Chlorhexidine Gluconate Cloth  6 each Topical Q0600   vitamin D3  1,000 Units Oral Daily   cinacalcet  30 mg Oral Q breakfast   gabapentin  300 mg Oral BID  insulin aspart  0-5 Units Subcutaneous QHS   insulin aspart  0-9 Units Subcutaneous TID WC   levETIRAcetam  250 mg Oral Q M,W,F-1800   lidocaine  1 patch Transdermal Q24H   losartan  100 mg Oral QHS   metoprolol succinate  100 mg Oral Daily   pantoprazole  40 mg Oral Daily   sertraline  25 mg Oral Daily   torsemide  100 mg Oral Daily   traZODone  25 mg Oral QHS   Continuous Infusions:  anticoagulant sodium citrate       LOS: 2 days   Time spent= 35 mins    Cynthia Whitmoyer Arsenio Loader, MD [facility]  If 7PM-7AM, please contact night-coverage  05/11/2022, 8:24 AM

## 2022-05-12 DIAGNOSIS — R262 Difficulty in walking, not elsewhere classified: Secondary | ICD-10-CM | POA: Diagnosis not present

## 2022-05-12 DIAGNOSIS — M79604 Pain in right leg: Secondary | ICD-10-CM | POA: Diagnosis not present

## 2022-05-12 DIAGNOSIS — R531 Weakness: Secondary | ICD-10-CM | POA: Diagnosis not present

## 2022-05-12 LAB — BASIC METABOLIC PANEL
Anion gap: 12 (ref 5–15)
BUN: 35 mg/dL — ABNORMAL HIGH (ref 8–23)
CO2: 28 mmol/L (ref 22–32)
Calcium: 8.4 mg/dL — ABNORMAL LOW (ref 8.9–10.3)
Chloride: 99 mmol/L (ref 98–111)
Creatinine, Ser: 6.45 mg/dL — ABNORMAL HIGH (ref 0.44–1.00)
GFR, Estimated: 6 mL/min — ABNORMAL LOW (ref >60)
Glucose, Bld: 153 mg/dL — ABNORMAL HIGH (ref 70–99)
Potassium: 4.2 mmol/L (ref 3.5–5.1)
Sodium: 139 mmol/L (ref 135–145)

## 2022-05-12 LAB — CBC
HCT: 25.3 % — ABNORMAL LOW (ref 36.0–46.0)
Hemoglobin: 8 g/dL — ABNORMAL LOW (ref 12.0–15.0)
MCH: 27.4 pg (ref 26.0–34.0)
MCHC: 31.6 g/dL (ref 30.0–36.0)
MCV: 86.6 fL (ref 80.0–100.0)
Platelets: 208 10*3/uL (ref 150–400)
RBC: 2.92 MIL/uL — ABNORMAL LOW (ref 3.87–5.11)
RDW: 14.6 % (ref 11.5–15.5)
WBC: 9 10*3/uL (ref 4.0–10.5)
nRBC: 0 % (ref 0.0–0.2)

## 2022-05-12 LAB — GLUCOSE, CAPILLARY
Glucose-Capillary: 147 mg/dL — ABNORMAL HIGH (ref 70–99)
Glucose-Capillary: 144 mg/dL — ABNORMAL HIGH (ref 70–99)
Glucose-Capillary: 158 mg/dL — ABNORMAL HIGH (ref 70–99)
Glucose-Capillary: 169 mg/dL — ABNORMAL HIGH (ref 70–99)

## 2022-05-12 LAB — MAGNESIUM: Magnesium: 1.8 mg/dL (ref 1.7–2.4)

## 2022-05-12 LAB — TSH: TSH: 0.687 u[IU]/mL (ref 0.350–4.500)

## 2022-05-12 LAB — VITAMIN B12: Vitamin B-12: 271 pg/mL (ref 180–914)

## 2022-05-12 MED ORDER — METHYLPREDNISOLONE 4 MG PO TBPK: 4.0000 mg | ORAL_TABLET | Freq: Four times a day (QID) | ORAL | Status: DC

## 2022-05-12 MED ORDER — METHYLPREDNISOLONE 4 MG PO TABS: 4.0000 mg | ORAL_TABLET | Freq: Every day | ORAL | Status: DC

## 2022-05-12 MED ORDER — LEVETIRACETAM 500 MG PO TABS
1000.0000 mg | ORAL_TABLET | Freq: Every day | ORAL | Status: DC
  Administered 2022-05-12 – 2022-05-16 (×5): 1000 mg via ORAL
  Filled 2022-05-12 (×5): qty 2

## 2022-05-12 MED ORDER — METHYLPREDNISOLONE 4 MG PO TABS
8.0000 mg | ORAL_TABLET | Freq: Every day | ORAL | Status: AC
  Administered 2022-05-16: 8 mg via ORAL
  Filled 2022-05-12: qty 2

## 2022-05-12 MED ORDER — METHYLPREDNISOLONE 4 MG PO TABS
20.0000 mg | ORAL_TABLET | Freq: Every day | ORAL | Status: AC
  Administered 2022-05-13: 20 mg via ORAL
  Filled 2022-05-12: qty 5

## 2022-05-12 MED ORDER — METHYLPREDNISOLONE 4 MG PO TBPK: 8.0000 mg | ORAL_TABLET | Freq: Every evening | ORAL | Status: DC

## 2022-05-12 MED ORDER — METHYLPREDNISOLONE 4 MG PO TABS
24.0000 mg | ORAL_TABLET | Freq: Every day | ORAL | Status: AC
  Administered 2022-05-12: 24 mg via ORAL
  Filled 2022-05-12: qty 6

## 2022-05-12 MED ORDER — METHYLPREDNISOLONE 4 MG PO TABS
16.0000 mg | ORAL_TABLET | Freq: Every day | ORAL | Status: AC
  Administered 2022-05-14: 16 mg via ORAL
  Filled 2022-05-12: qty 4

## 2022-05-12 MED ORDER — METHYLPREDNISOLONE 4 MG PO TABS
12.0000 mg | ORAL_TABLET | Freq: Every day | ORAL | Status: AC
  Administered 2022-05-15: 12 mg via ORAL
  Filled 2022-05-12: qty 3

## 2022-05-12 MED ORDER — METHYLPREDNISOLONE 4 MG PO TBPK: 8.0000 mg | ORAL_TABLET | Freq: Every morning | ORAL | Status: DC

## 2022-05-12 MED ORDER — METHYLPREDNISOLONE 4 MG PO TBPK: 4.0000 mg | ORAL_TABLET | ORAL | Status: DC

## 2022-05-12 MED ORDER — METHYLPREDNISOLONE 4 MG PO TBPK: 4.0000 mg | ORAL_TABLET | Freq: Three times a day (TID) | ORAL | Status: DC

## 2022-05-12 NOTE — TOC Progression Note (Signed)
Transition of Care [facility]) - Progression Note    Patient Details  Name: Cynthia Dean MRN: [id number] Date of Birth: 1951-04-28  Transition of Care [facility]) CM/SW Contact  Laurena Slimmer, RN Phone Number: 05/12/2022, 4:03 PM  Clinical Narrative:    [place] started.    Expected Discharge Plan:  (TBD) Barriers to Discharge: Continued Medical Work up  Expected Discharge Plan and Services Expected Discharge Plan:  (TBD)       Living arrangements for the past 2 months: Single Family Home                                       Social Determinants of Health (SDOH) Interventions    Readmission Risk Interventions    10/25/2019    8:43 AM 10/24/2019   10:14 AM  Readmission Risk Prevention Plan  Transportation Screening Complete   PCP or Specialist Appt within 3-5 Days Not Complete   HRI or Home Care Consult Complete Complete  Palliative Care Screening Not Applicable   Medication Review (RN Care Manager) Complete

## 2022-05-12 NOTE — Plan of Care (Signed)
  Problem: Fluid Volume: Goal: Ability to maintain a balanced intake and output will improve Outcome: Progressing   Problem: Coping: Goal: Ability to adjust to condition or change in health will improve Outcome: Progressing

## 2022-05-12 NOTE — Progress Notes (Signed)
PROGRESS NOTE    Cynthia Dean  GGE:[id number] DOB: 17-Dec-1950 DOA: 05/09/2022 PCP: Lowella Bandy, MD   Brief Narrative:  71 year old with history of ESRD on HD MWF, HTN, HLD, DM 2, CVA, depression, CAD, PE on Eliquis, seizure, diastolic CHF, anemia, obesity admitted for right leg pain, weakness and unable to ambulate.  Apparently some of the symptoms have been going on for about 3 months.  Negative right lower extremity Doppler on 10/13.  PT/OT recommending rehab.  Eventually due to right lower extremity pain and back discomfort CT of the tib-fib and MRI lumbar spine was done which showed chronic changes and radiculopathy/neuropathy.  Neurosurgery and orthopedic recommended trial of steroids, therapy and outpatient follow-up with physiatry.   Assessment & Plan:  Principal Problem:   Unable to ambulate Active Problems:   Right leg pain   ESRD on dialysis ([place])   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-stage renal disease) (HCC)   CAD (coronary artery disease)   Chronic diastolic CHF (congestive heart failure) (HCC)   Essential hypertension   COPD (chronic obstructive pulmonary disease) (HCC)   Depression   Dyslipidemia   Pulmonary emboli (HCC)   Seizure (HCC)   Stroke (HCC)   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[place])   Obesity with body mass index (BMI) of 30.0 to 39.9   Diarrhea   Generalized weakness    * Unable to ambulate; Right LE Pain Chronic lumbar radiculopathy along with peroneal/peripheral neuropathy -Secondary to severe pain in the right leg.  X-ray negative, lower extremity Dopplers is also negative for DVT.  Does have good pulses.  PT/OT recommending SNF.  Currently Lipitor is on hold.  Neurontin 20 mg twice daily started. CK - 29 CT and lumbar spine reviewed showing chronic changes.  Not a good surgical candidate.  Neurosurgery and orthopedic recommendations appreciated.  Now trial of for steroids, PT/OT and outpatient follow-up with physiatry     ESRD on dialysis [facility]) MWF -consulted Dr. Theador Hawthorne of renal for HD   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-stage renal disease) ([place]) Hemoglobin stable 8.4 (7.8 on 04/11/2022).  Sensipar -Follow-up with CBC   CAD (coronary artery disease) No chest pain, on aspirin and Eliquis.  Statin on hold   Chronic diastolic CHF (congestive heart failure) ([place]) Patient does not have leg edema.  CHF is compensated.  2D echo 03/22/2022 showed EF of 50 to 55% with grade 2 diastolic dysfunction.  Volume status will be managed with HD.  On daily torsemide   Essential hypertension -Cozaar, Toprol-XL.  Daily torsemide IV as needed ordered     COPD (chronic obstructive pulmonary disease) (HCC) Stable - Bronchodilators   Depression - Continue home medications   Dyslipidemia - Hold Lipitor as above   Pulmonary emboli (HCC) - Continue Eliquis   Seizure (HCC) -Keppra     Stroke ([place]) -On aspirin, Eliquis and statin     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) Recent A1c 5.  8, well controlled.  Patient's not taking 70/30 insulin currently. -Sliding scale insulin   Obesity with body mass index (BMI) of 30.0 to 39.9   BMI= 37.02,   and BW= 113.7Kg -Diet and exercise.   -Encourage to lose weight.     Diarrhea -Resolved    PT/OT= SNF   DVT prophylaxis:  apixaban (ELIQUIS) tablet 5 mg  Code Status: Full Family Communication:    Status is: Inpatient Remains inpatient appropriate because: Continue to work with physical therapy.  In the  meantime we will have TOC start working on placement   Subjective: Seen and examined at bedside, no new complaints this morning.  Overall feels okay.  Examination:  Constitutional: Not in acute distress Respiratory: Clear to auscultation bilaterally Cardiovascular: Normal sinus rhythm, no rubs Abdomen: Nontender nondistended good bowel sounds Musculoskeletal: Right lower extremity weakness greater than left Skin: No rashes  seen Neurologic: CN 2-12 grossly intact.  And nonfocal Psychiatric: Normal judgment and insight. Alert and oriented x 3. Normal mood.    Objective: Vitals:   05/11/22 2130 05/12/22 0437 05/12/22 0500 05/12/22 0822  BP:  (!) 130/58  (!) 112/57  Pulse:  71  71  Resp:  18  16  Temp:  99.4 F (37.4 C)  98.3 F (36.8 C)  TempSrc:  Oral  Oral  SpO2: 95% 97%  97%  Weight:   110.1 kg   Height:        Intake/Output Summary (Last 24 hours) at 05/12/2022 1055 Last data filed at 05/11/2022 2200 Gross per 24 hour  Intake 120 ml  Output 2000 ml  Net -1880 ml   Filed Weights   05/11/22 0953 05/11/22 1408 05/12/22 0500  Weight: 111.6 kg 109.6 kg 110.1 kg     Data Reviewed:   CBC: Recent Labs  Lab 05/09/22 0054 05/11/22 0940 05/12/22 0652  WBC 10.2 8.7 9.0  NEUTROABS  --  6.0  --   HGB 8.4* 8.0* 8.0*  HCT 27.4* 24.8* 25.3*  MCV 89.8 85.8 86.6  PLT 254 221 676   Basic Metabolic Panel: Recent Labs  Lab 05/09/22 0054 05/10/22 0515 05/11/22 0940 05/12/22 0652  NA 139 139 137 139  K 4.0 3.9 4.6 4.2  CL 106 102 100 99  CO2 '24 25 25 28  '$ GLUCOSE 164* 149* 131* 153*  BUN 42* 27* 50* 35*  CREATININE 9.38* 6.53* 8.81* 6.45*  CALCIUM 8.6* 8.6* 8.6* 8.4*  MG  --   --   --  1.8  PHOS  --   --  6.5*  --    GFR: Estimated Creatinine Clearance: 10.6 mL/min (A) (by C-G formula based on SCr of 6.45 mg/dL (H)). Liver Function Tests: Recent Labs  Lab 05/11/22 0940  ALBUMIN 2.7*   No results for input(s): "LIPASE", "AMYLASE" in the last 168 hours. No results for input(s): "AMMONIA" in the last 168 hours. Coagulation Profile: No results for input(s): "INR", "PROTIME" in the last 168 hours. Cardiac Enzymes: Recent Labs  Lab 05/09/22 0054  CKTOTAL 29*   BNP (last 3 results) No results for input(s): "PROBNP" in the last 8760 hours. HbA1C: No results for input(s): "HGBA1C" in the last 72 hours. CBG: Recent Labs  Lab 05/10/22 2112 05/11/22 0829 05/11/22 1622  05/11/22 2121 05/12/22 0824  GLUCAP 97 114* 128* 182* 147*   Lipid Profile: No results for input(s): "CHOL", "HDL", "LDLCALC", "TRIG", "CHOLHDL", "LDLDIRECT" in the last 72 hours. Thyroid Function Tests: Recent Labs    05/12/22 0652  TSH 0.687   Anemia Panel: No results for input(s): "VITAMINB12", "FOLATE", "FERRITIN", "TIBC", "IRON", "RETICCTPCT" in the last 72 hours. Sepsis Labs: No results for input(s): "PROCALCITON", "LATICACIDVEN" in the last 168 hours.  No results found for this or any previous visit (from the past 240 hour(s)).       Radiology Studies: MR LUMBAR SPINE WO CONTRAST  Result Date: 05/11/2022 CLINICAL DATA:  Initial evaluation for lumbar radiculopathy, progressively worsening right leg pain. History of end-stage renal disease on hemodialysis. EXAM: MRI LUMBAR SPINE WITHOUT  CONTRAST TECHNIQUE: Multiplanar, multisequence MR imaging of the lumbar spine was performed. No intravenous contrast was administered. COMPARISON:  Prior MRI from 01/21/2008. FINDINGS: Segmentation: Standard. Lowest well-formed disc space labeled the L5-S1 level. Alignment: Trace 2 mm facet mediated anterolisthesis of L3 on L4 and L4 on L5, with trace retrolisthesis of L5 on S1. Vertebrae: Vertebral body height maintained without acute or chronic fracture. Bone marrow signal intensity within normal limits. No worrisome osseous lesions. No abnormal marrow edema. Conus medullaris and cauda equina: Conus extends to the L2 level. Conus and cauda equina appear normal. Paraspinal and other soft tissues: Mild edema within the lower posterior paraspinous soft tissues adjacent to the L4-5 and L5-S1 facets, likely reactive in nature due to adjacent facet arthritis. No collections. Atrophic multi-cystic kidneys, consistent with history of end-stage renal disease and dialysis. 1.8 cm simple cyst partially visualize within the left adnexa. This is almost certainly benign given size, with no follow-up imaging  recommended. Note: This recommendation does not apply to premenarchal patients and to those with increased risk (genetic, family history, elevated tumor markers or other high-risk factors) of ovarian cancer. Reference: JACR 2020 Feb; 17(2):248-254 Disc levels: T12-L1: Disc desiccation without significant disc bulge. Bilateral facet hypertrophy. No significant spinal stenosis. Foramina remain patent. L1-2: Disc desiccation without significant disc bulge. Mild facet hypertrophy. No canal or foraminal stenosis. L2-3: Disc desiccation with mild annular disc bulge. Mild to moderate facet hypertrophy. Rounded 6 mm nodular lesion at the anteromedial aspect of the left L2-3 facet favored to reflect a small complex synovial cyst. Resultant mild canal with moderate left lateral recess stenosis. Mild bilateral foraminal narrowing. L3-4: Mild disc bulge with disc desiccation. Moderate facet and ligament flavum hypertrophy with associated trace joint effusions. No significant spinal stenosis. Moderate bilateral L3 foraminal narrowing. L4-5: Trace anterolisthesis. Diffuse disc bulge with disc desiccation. Disc bulging slightly eccentric to the left. Moderate facet and ligament flavum hypertrophy with associated small joint effusions. Resultant mild canal with moderate bilateral subarticular stenosis. Resultant moderate left with mild to moderate right L4 foraminal narrowing. L5-S1: Trace retrolisthesis. Degenerative intervertebral disc space narrowing with diffuse disc bulge and disc desiccation. Associated reactive endplate spurring. Superimposed small central disc protrusion with annular fissure indents the ventral thecal sac. Mild facet hypertrophy. No significant spinal stenosis. Moderate bilateral L5 foraminal narrowing. IMPRESSION: 1. Multifactorial degenerative changes at L4-5 with resultant moderate bilateral subarticular stenosis, with moderate left greater than right L4 foraminal narrowing. 2. Moderate bilateral L3 and  L5 foraminal stenosis related to disc bulge, reactive endplate spurring, and facet degeneration. 3. 6 mm nodular lesion at the anteromedial aspect of the left L2-3 facet, favored to reflect a small complex synovial cyst. Resultant mild canal with moderate left lateral recess stenosis at this level. 4. Lower lumbar facet hypertrophy, most pronounced at L4-5. Finding could contribute to lower back pain. Electronically Signed   By: Jeannine Boga M.D.   On: 05/11/2022 21:04   CT TIBIA FIBULA RIGHT WO CONTRAST  Result Date: 05/11/2022 CLINICAL DATA:  Foot pain, persistent posttraumatic. Right leg pain with weakness and inability to ambulate. EXAM: CT OF THE LOWER RIGHT EXTREMITY WITHOUT CONTRAST TECHNIQUE: Multidetector CT imaging of the right lower leg was performed according to the standard protocol. RADIATION DOSE REDUCTION: This exam was performed according to the departmental dose-optimization program which includes automated exposure control, adjustment of the mA and/or kV according to patient size and/or use of iterative reconstruction technique. COMPARISON:  Right hip and lower leg radiographs 05/09/2022. Doppler ultrasound  03/20/2022. FINDINGS: Bones/Joint/Cartilage Images extend from just above the right knee through the right ankle. The right foot is incompletely visualized. There is no evidence of acute fracture or dislocation. Moderate degenerative changes are present in the patellofemoral and medial compartments of the right knee. No significant knee joint effusion. No significant ankle joint effusion or arthropathy. Ligaments Suboptimally assessed by CT. Muscles and Tendons Mild generalized muscular atrophy. The patellar tendon is intact. The ankle tendons appear intact, although not all of their distal insertions are included. Soft tissues Prominent diffuse vascular calcifications. No focal fluid collection, foreign body or soft tissue emphysema. Minimal subcutaneous edema anteriorly in the  distal lower leg. IMPRESSION: 1. No acute osseous findings identified in the right lower leg. 2. Moderate degenerative changes in the patellofemoral and medial compartments of the right knee. 3. Diffuse vascular calcifications. 4. Mild diffuse muscular atrophy. Electronically Signed   By: Richardean Sale M.D.   On: 05/11/2022 15:19        Scheduled Meds:  apixaban  5 mg Oral BID   aspirin EC  81 mg Oral Daily   Chlorhexidine Gluconate Cloth  6 each Topical Q0600   vitamin D3  1,000 Units Oral Daily   cinacalcet  30 mg Oral Q breakfast   gabapentin  300 mg Oral BID   insulin aspart  0-5 Units Subcutaneous QHS   insulin aspart  0-9 Units Subcutaneous TID WC   levETIRAcetam  1,000 mg Oral Daily   levETIRAcetam  250 mg Oral Q M,W,F-1800   lidocaine  1 patch Transdermal Q24H   losartan  100 mg Oral QHS   methylPREDNISolone  24 mg Oral Daily   Followed by   Derrill Memo ON 05/13/2022] methylPREDNISolone  20 mg Oral Daily   Followed by   Derrill Memo ON 05/14/2022] methylPREDNISolone  16 mg Oral Daily   Followed by   Derrill Memo ON 05/15/2022] methylPREDNISolone  12 mg Oral Daily   Followed by   Derrill Memo ON 05/16/2022] methylPREDNISolone  8 mg Oral Daily   Followed by   Derrill Memo ON 05/17/2022] methylPREDNISolone  4 mg Oral Daily   metoprolol succinate  100 mg Oral Daily   pantoprazole  40 mg Oral Daily   sertraline  25 mg Oral Daily   torsemide  100 mg Oral Daily   traZODone  25 mg Oral QHS   Continuous Infusions:  anticoagulant sodium citrate       LOS: 3 days   Time spent= 35 mins    Tyron Manetta Arsenio Loader, MD [facility]  If 7PM-7AM, please contact night-coverage  05/12/2022, 10:55 AM

## 2022-05-12 NOTE — Progress Notes (Signed)
PT Cancellation Note  Patient Details Name: Cynthia Dean MRN: [id number] DOB: 18-Jun-1951   Cancelled Treatment:    Reason Eval/Treat Not Completed: Other (comment).  Pt resting in bed upon PT arrival.  Initially pt appearing agreeable to therapy but then suddenly pt refusing any physical therapy d/t feeling like she just needed to rest.  Therapist attempted to encourage pt to participate in therapy but pt continued to firmly refuse.  Will re-attempt PT session at a later date/time.  Leitha Bleak, PT 05/12/22, 3:44 PM

## 2022-05-12 NOTE — Progress Notes (Signed)
Subjective:  Cynthia Dean states she is not having significant pain in the right lower extremity today.  Patient refused physical therapy earlier today telling the therapist she needed to rest.  An MRI of the lumbar spine was performed the patient was evaluated by Dr. Izora Ribas from neurosurgery this morning.  Objective:   VITALS:   Vitals:   05/11/22 2130 05/12/22 0437 05/12/22 0500 05/12/22 0822  BP:  (!) 130/58  (!) 112/57  Pulse:  71  71  Resp:  18  16  Temp:  99.4 F (37.4 C)  98.3 F (36.8 C)  TempSrc:  Oral  Oral  SpO2: 95% 97%  97%  Weight:   110.1 kg   Height:        PHYSICAL EXAM: Right lower extremity: Patient skin remains intact throughout the right lower extremity.  She had minimal tenderness to palpation over the anterior tibial crest which was very tender last night on my exam.  She is able to dorsiflex and plantarflex her ankle which she was unable to do last night on my exam.  She still has mild to moderate loss of sensation in the right foot compared to the left.  She has palpable pedal pulses.   LABS  Results for orders placed or performed during the hospital encounter of 05/09/22 (from the past 24 hour(s))  Glucose, capillary     Status: Abnormal   Collection Time: 05/11/22  4:22 PM  Result Value Ref Range   Glucose-Capillary 128 (H) 70 - 99 mg/dL  Glucose, capillary     Status: Abnormal   Collection Time: 05/11/22  9:21 PM  Result Value Ref Range   Glucose-Capillary 182 (H) 70 - 99 mg/dL  Basic metabolic panel     Status: Abnormal   Collection Time: 05/12/22  6:52 AM  Result Value Ref Range   Sodium 139 135 - 145 mmol/L   Potassium 4.2 3.5 - 5.1 mmol/L   Chloride 99 98 - 111 mmol/L   CO2 28 22 - 32 mmol/L   Glucose, Bld 153 (H) 70 - 99 mg/dL   BUN 35 (H) 8 - 23 mg/dL   Creatinine, Ser 6.45 (H) 0.44 - 1.00 mg/dL   Calcium 8.4 (L) 8.9 - 10.3 mg/dL   GFR, Estimated 6 (L) >60 mL/min   Anion gap 12 5 - 15  CBC     Status: Abnormal   Collection Time:  05/12/22  6:52 AM  Result Value Ref Range   WBC 9.0 4.0 - 10.5 K/uL   RBC 2.92 (L) 3.87 - 5.11 MIL/uL   Hemoglobin 8.0 (L) 12.0 - 15.0 g/dL   HCT 25.3 (L) 36.0 - 46.0 %   MCV 86.6 80.0 - 100.0 fL   MCH 27.4 26.0 - 34.0 pg   MCHC 31.6 30.0 - 36.0 g/dL   RDW 14.6 11.5 - 15.5 %   Platelets 208 150 - 400 K/uL   nRBC 0.0 0.0 - 0.2 %  Magnesium     Status: None   Collection Time: 05/12/22  6:52 AM  Result Value Ref Range   Magnesium 1.8 1.7 - 2.4 mg/dL  TSH     Status: None   Collection Time: 05/12/22  6:52 AM  Result Value Ref Range   TSH 0.687 0.350 - 4.500 uIU/mL  Glucose, capillary     Status: Abnormal   Collection Time: 05/12/22  8:24 AM  Result Value Ref Range   Glucose-Capillary 147 (H) 70 - 99 mg/dL   Comment 1 Notify  RN    Comment 2 Document in Chart   Vitamin B12     Status: None   Collection Time: 05/12/22 10:11 AM  Result Value Ref Range   Vitamin B-12 271 180 - 914 pg/mL  Glucose, capillary     Status: Abnormal   Collection Time: 05/12/22 12:13 PM  Result Value Ref Range   Glucose-Capillary 144 (H) 70 - 99 mg/dL   Comment 1 Notify RN    Comment 2 Document in Chart     MR LUMBAR SPINE WO CONTRAST  Result Date: 05/11/2022 CLINICAL DATA:  Initial evaluation for lumbar radiculopathy, progressively worsening right leg pain. History of end-stage renal disease on hemodialysis. EXAM: MRI LUMBAR SPINE WITHOUT CONTRAST TECHNIQUE: Multiplanar, multisequence MR imaging of the lumbar spine was performed. No intravenous contrast was administered. COMPARISON:  Prior MRI from 01/21/2008. FINDINGS: Segmentation: Standard. Lowest well-formed disc space labeled the L5-S1 level. Alignment: Trace 2 mm facet mediated anterolisthesis of L3 on L4 and L4 on L5, with trace retrolisthesis of L5 on S1. Vertebrae: Vertebral body height maintained without acute or chronic fracture. Bone marrow signal intensity within normal limits. No worrisome osseous lesions. No abnormal marrow edema. Conus  medullaris and cauda equina: Conus extends to the L2 level. Conus and cauda equina appear normal. Paraspinal and other soft tissues: Mild edema within the lower posterior paraspinous soft tissues adjacent to the L4-5 and L5-S1 facets, likely reactive in nature due to adjacent facet arthritis. No collections. Atrophic multi-cystic kidneys, consistent with history of end-stage renal disease and dialysis. 1.8 cm simple cyst partially visualize within the left adnexa. This is almost certainly benign given size, with no follow-up imaging recommended. Note: This recommendation does not apply to premenarchal patients and to those with increased risk (genetic, family history, elevated tumor markers or other high-risk factors) of ovarian cancer. Reference: JACR 2020 Feb; 17(2):248-254 Disc levels: T12-L1: Disc desiccation without significant disc bulge. Bilateral facet hypertrophy. No significant spinal stenosis. Foramina remain patent. L1-2: Disc desiccation without significant disc bulge. Mild facet hypertrophy. No canal or foraminal stenosis. L2-3: Disc desiccation with mild annular disc bulge. Mild to moderate facet hypertrophy. Rounded 6 mm nodular lesion at the anteromedial aspect of the left L2-3 facet favored to reflect a small complex synovial cyst. Resultant mild canal with moderate left lateral recess stenosis. Mild bilateral foraminal narrowing. L3-4: Mild disc bulge with disc desiccation. Moderate facet and ligament flavum hypertrophy with associated trace joint effusions. No significant spinal stenosis. Moderate bilateral L3 foraminal narrowing. L4-5: Trace anterolisthesis. Diffuse disc bulge with disc desiccation. Disc bulging slightly eccentric to the left. Moderate facet and ligament flavum hypertrophy with associated small joint effusions. Resultant mild canal with moderate bilateral subarticular stenosis. Resultant moderate left with mild to moderate right L4 foraminal narrowing. L5-S1: Trace  retrolisthesis. Degenerative intervertebral disc space narrowing with diffuse disc bulge and disc desiccation. Associated reactive endplate spurring. Superimposed small central disc protrusion with annular fissure indents the ventral thecal sac. Mild facet hypertrophy. No significant spinal stenosis. Moderate bilateral L5 foraminal narrowing. IMPRESSION: 1. Multifactorial degenerative changes at L4-5 with resultant moderate bilateral subarticular stenosis, with moderate left greater than right L4 foraminal narrowing. 2. Moderate bilateral L3 and L5 foraminal stenosis related to disc bulge, reactive endplate spurring, and facet degeneration. 3. 6 mm nodular lesion at the anteromedial aspect of the left L2-3 facet, favored to reflect a small complex synovial cyst. Resultant mild canal with moderate left lateral recess stenosis at this level. 4. Lower lumbar facet hypertrophy, most  pronounced at L4-5. Finding could contribute to lower back pain. Electronically Signed   By: Jeannine Boga M.D.   On: 05/11/2022 21:04   CT TIBIA FIBULA RIGHT WO CONTRAST  Result Date: 05/11/2022 CLINICAL DATA:  Foot pain, persistent posttraumatic. Right leg pain with weakness and inability to ambulate. EXAM: CT OF THE LOWER RIGHT EXTREMITY WITHOUT CONTRAST TECHNIQUE: Multidetector CT imaging of the right lower leg was performed according to the standard protocol. RADIATION DOSE REDUCTION: This exam was performed according to the departmental dose-optimization program which includes automated exposure control, adjustment of the mA and/or kV according to patient size and/or use of iterative reconstruction technique. COMPARISON:  Right hip and lower leg radiographs 05/09/2022. Doppler ultrasound 03/20/2022. FINDINGS: Bones/Joint/Cartilage Images extend from just above the right knee through the right ankle. The right foot is incompletely visualized. There is no evidence of acute fracture or dislocation. Moderate degenerative  changes are present in the patellofemoral and medial compartments of the right knee. No significant knee joint effusion. No significant ankle joint effusion or arthropathy. Ligaments Suboptimally assessed by CT. Muscles and Tendons Mild generalized muscular atrophy. The patellar tendon is intact. The ankle tendons appear intact, although not all of their distal insertions are included. Soft tissues Prominent diffuse vascular calcifications. No focal fluid collection, foreign body or soft tissue emphysema. Minimal subcutaneous edema anteriorly in the distal lower leg. IMPRESSION: 1. No acute osseous findings identified in the right lower leg. 2. Moderate degenerative changes in the patellofemoral and medial compartments of the right knee. 3. Diffuse vascular calcifications. 4. Mild diffuse muscular atrophy. Electronically Signed   By: Richardean Sale M.D.   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 Izora Ribas assessing the patient after her MRI.  Patient has the degenerative changes throughout the lumbar spine and areas of neuroforaminal narrowing.  No surgical intervention is required at this time for both her right leg and lumbar spine.  Patient will begin an oral steroid taper.  She may weight-bear as tolerated in the right lower extremity.  Recommend continuing physical and occupational therapy.  I will sign off for now.  She  may follow-up with orthopedics on a as needed basis.    Thornton Park , MD 05/12/2022, 4:02 PM

## 2022-05-12 NOTE — Consult Note (Signed)
Referring Physician:  No referring provider defined for this encounter.  Primary Physician:  Lowella Bandy, MD  History of Present Illness: 05/12/2022 Cynthia Dean is admitted with multiple different issues.  She has been suffering from right leg pain previously, but does not report that today.  She reports that her pain is not bothering her currently.  She reported to another consultant that she has had pain in her anterior right thigh that began when she started chemotherapy 3 months ago, and is impairing her ability to walk.  On my exam today, [name] does not report weakness or pain in her right leg.  .   Review of Systems:  A 10 point review of systems is negative, except for the pertinent positives and negatives detailed in the HPI.  Past Medical History: Past Medical History:  Diagnosis Date   Acute on chronic respiratory failure with hypoxia ([place]) 07/30/2019   Acute respiratory failure with hypoxia ([place]) 12/31/2018   Carotid arterial disease ([place])    a. 02/2018 < bilat ICA stenosis.   COPD (chronic obstructive pulmonary disease) (HCC)    Coronary artery disease    a. 11/2015 MV: EF 57%, no ischemia/infarct.   Cryptogenic stroke ([place])    a. 10/2017 s/p implantable loop recorder (no Afib to date).   Diabetes mellitus without complication ([place])    Diarrhea    ESRD on dialysis [facility])    GI bleed    a. 01/2019 EGD/Colonoscopy: Mild gastritis. Colitis.   Heart murmur    a. 12/2018 Echo: EF 55-60%, impaired relaxation. Mod dil LA. Mildly dil RA. Mild Ca2+ of AoV.   Hematemesis 06/27/2017   History of 2019 novel coronavirus disease (COVID-19) 07/30/2019   Hyperkalemia    Hyperlipidemia    Hypertension    Intractable nausea and vomiting 08/08/2015   Nausea & vomiting 05/30/2018   Nausea and vomiting 05/29/2018   Pneumonia due to COVID-19 virus     Past Surgical History: Past Surgical History:  Procedure Laterality Date   A/V FISTULAGRAM Left 12/20/2016    Procedure: A/V Fistulagram;  Surgeon: Katha Cabal, MD;  Location: [place] [facility];  Service: Cardiovascular;  Laterality: Left;   A/V SHUNT INTERVENTION N/A 12/20/2016   Procedure: A/V Shunt Intervention;  Surgeon: Katha Cabal, MD;  Location: [place] [facility];  Service: Cardiovascular;  Laterality: N/A;   A/V SHUNTOGRAM Left 09/11/2017   Procedure: A/V SHUNTOGRAM;  Surgeon: Algernon Huxley, MD;  Location: [place] [facility];  Service: Cardiovascular;  Laterality: Left;   A/V SHUNTOGRAM Left 11/21/2019   Procedure: A/V SHUNTOGRAM;  Surgeon: Algernon Huxley, MD;  Location: [place] [facility];  Service: Cardiovascular;  Laterality: Left;   BREAST BIOPSY Bilateral 07/19/2000   neg   BREAST BIOPSY Left 11/03/2021   [place] bx mass at 3:00, venus marker, axilla bx-hyrdo marker, path pending   COLONOSCOPY WITH PROPOFOL N/A 02/16/2019   Procedure: COLONOSCOPY WITH PROPOFOL;  Surgeon: Toledo, Benay Pike, MD;  Location: [facility];  Service: Gastroenterology;  Laterality: N/A;   ESOPHAGOGASTRODUODENOSCOPY (EGD) WITH PROPOFOL N/A 02/16/2019   Procedure: ESOPHAGOGASTRODUODENOSCOPY (EGD) WITH PROPOFOL;  Surgeon: Toledo, Benay Pike, MD;  Location: [facility];  Service: Gastroenterology;  Laterality: N/A;   IR US GUIDE BX ASP/DRAIN  03/25/2022   LOOP RECORDER INSERTION N/A 11/16/2017   Procedure: LOOP RECORDER INSERTION;  Surgeon: Deboraha Sprang, MD;  Location: [place] [facility];  Service: Cardiovascular;  Laterality: N/A;   PERIPHERAL VASCULAR CATHETERIZATION  Left 02/02/2015   Procedure: A/V Shuntogram/Fistulagram;  Surgeon: Algernon Huxley, MD;  Location: [place] [facility];  Service: Cardiovascular;  Laterality: Left;   PERIPHERAL VASCULAR CATHETERIZATION Left 02/02/2015   Procedure: A/V Shunt Intervention;  Surgeon: Algernon Huxley, MD;  Location: [place] [facility];  Service: Cardiovascular;  Laterality: Left;   PERIPHERAL VASCULAR CATHETERIZATION Left 03/09/2015   Procedure: A/V  Shuntogram/Fistulagram;  Surgeon: Algernon Huxley, MD;  Location: [place] [facility];  Service: Cardiovascular;  Laterality: Left;   PERIPHERAL VASCULAR CATHETERIZATION N/A 03/09/2015   Procedure: A/V Shunt Intervention;  Surgeon: Algernon Huxley, MD;  Location: [place] [facility];  Service: Cardiovascular;  Laterality: N/A;   PERIPHERAL VASCULAR CATHETERIZATION Left 04/17/2015   Procedure: Upper Extremity Angiography;  Surgeon: Algernon Huxley, MD;  Location: [place] [facility];  Service: Cardiovascular;  Laterality: Left;   PERIPHERAL VASCULAR CATHETERIZATION  04/17/2015   Procedure: Upper Extremity Intervention;  Surgeon: Algernon Huxley, MD;  Location: [place] [facility];  Service: Cardiovascular;;   PERIPHERAL VASCULAR CATHETERIZATION N/A 08/10/2015   Procedure: A/V Shuntogram/Fistulagram;  Surgeon: Algernon Huxley, MD;  Location: [place] [facility];  Service: Cardiovascular;  Laterality: N/A;   PERIPHERAL VASCULAR CATHETERIZATION N/A 08/10/2015   Procedure: A/V Shunt Intervention;  Surgeon: Algernon Huxley, MD;  Location: [place] [facility];  Service: Cardiovascular;  Laterality: N/A;   PERIPHERAL VASCULAR CATHETERIZATION Left 04/11/2016   Procedure: A/V Shuntogram/Fistulagram;  Surgeon: Algernon Huxley, MD;  Location: [place] [facility];  Service: Cardiovascular;  Laterality: Left;   TEE WITHOUT CARDIOVERSION N/A 11/16/2017   Procedure: TRANSESOPHAGEAL ECHOCARDIOGRAM (TEE);  Surgeon: Minna Merritts, MD;  Location: [facility];  Service: Cardiovascular;  Laterality: N/A;    Allergies: Allergies as of 05/08/2022 - Review Complete 05/08/2022  Allergen Reaction Noted   Oxycodone Nausea And Vomiting 08/20/2019   Oxycodone-acetaminophen Other (See Comments) 08/22/2021   Wound dressing adhesive  08/22/2021   Hydrocodone  02/13/2019   Tape Itching 09/16/2016   Tapentadol Itching 09/16/2016    Medications: Current Meds  Medication Sig   apixaban (ELIQUIS) 5 MG TABS tablet Take 1 tablet (5 mg total) by  mouth 2 (two) times daily. Only start this script after you have completed eliquis 10 mg BID x 1 day.   aspirin EC 81 MG tablet Take 81 mg by mouth daily. Swallow whole.   atorvastatin (LIPITOR) 80 MG tablet Take 80 mg by mouth every evening.    cholecalciferol (VITAMIN D) 25 MCG tablet Take 1 tablet (1,000 Units total) by mouth daily.   cinacalcet (SENSIPAR) 30 MG tablet Take 30 mg by mouth daily with breakfast.   fluticasone (FLONASE) 50 MCG/ACT nasal spray Place 1 spray into both nostrils daily.   Ipratropium-Albuterol (COMBIVENT) 20-100 MCG/ACT AERS respimat Inhale 1 puff into the lungs every 6 (six) hours. (Patient taking differently: Inhale 1 puff into the lungs 2 (two) times daily.)   levETIRAcetam (KEPPRA) 1000 MG tablet Take 1 tablet (1,000 mg total) by mouth daily.   levETIRAcetam (KEPPRA) 250 MG tablet Take 250 mg by mouth 3 (three) times a week.   losartan (COZAAR) 100 MG tablet Take 100 mg by mouth at bedtime.   metoprolol succinate (TOPROL-XL) 100 MG 24 hr tablet Take 100 mg by mouth daily.   NOVOLIN 70/30 (70-30) 100 UNIT/ML injection SMARTSIG:28 Unit(s) SUB-Q Twice Daily   omeprazole (PRILOSEC) 20 MG capsule Take 20 mg by mouth 2 (two) times daily.    sertraline (ZOLOFT) 25  MG tablet Take 25 mg by mouth daily.   torsemide (DEMADEX) 100 MG tablet Take 100 mg by mouth daily.   traZODone (DESYREL) 50 MG tablet Take 25 mg by mouth at bedtime.    Social History: Social History   Tobacco Use   Smoking status: Former    Packs/day: 0.00    Types: Cigarettes   Smokeless tobacco: Never  Vaping Use   Vaping Use: Never used  Substance Use Topics   Alcohol use: No   Drug use: No    Family Medical History: Family History  Problem Relation Age of Onset   Breast cancer Father 83   Stroke Mother    Heart attack Mother    Heart Problems Sister    Breast cancer Cousin 27       1 st cousin. Maternal     Physical Examination: Vitals:   05/12/22 0437 05/12/22 0822  BP: (!)  130/58 (!) 112/57  Pulse: 71 71  Resp: 18 16  Temp: 99.4 F (37.4 C) 98.3 F (36.8 C)  SpO2: 97% 97%    General: Patient is well developed, well nourished, calm, collected, and in no apparent distress. Attention to examination is appropriate.  Neck:   Supple.  Full range of motion.  Respiratory: Patient is breathing without any difficulty on nasal cannula.   NEUROLOGICAL:     Awake, alert, oriented to person, place, and time.  Speech is clear and fluent. Fund of knowledge is appropriate.   Cranial Nerves: Pupils equal round and reactive to light.  Facial tone is symmetric.  Facial sensation is symmetric. Shoulder shrug is symmetric. Tongue protrusion is midline.  There is no pronator drift.  ROM of spine: full.    Strength: Side Biceps Triceps Deltoid Interossei Grip Wrist Ext. Wrist Flex.  R '5 5 5 5 5 5 5  '$ L '5 5 5 5 5 5 5   '$ Side Iliopsoas Quads Hamstring PF DF EHL  R '2 3 3 '$ 4+ 4+ 4+  L '5 5 5 5 5 5   '$ Reflexes are 1+ and symmetric at the biceps, triceps, brachioradialis, patella and achilles.   Hoffman's is absent.   Bilateral upper and lower extremity sensation is intact to light touch.    No evidence of dysmetria noted.  Gait is untested.     Medical Decision Making  Imaging: MRI L spine 05/11/2022 IMPRESSION: 1. Multifactorial degenerative changes at L4-5 with resultant moderate bilateral subarticular stenosis, with moderate left greater than right L4 foraminal narrowing. 2. Moderate bilateral L3 and L5 foraminal stenosis related to disc bulge, reactive endplate spurring, and facet degeneration. 3. 6 mm nodular lesion at the anteromedial aspect of the left L2-3 facet, favored to reflect a small complex synovial cyst. Resultant mild canal with moderate left lateral recess stenosis at this level. 4. Lower lumbar facet hypertrophy, most pronounced at L4-5. Finding could contribute to lower back pain.     Electronically Signed   By: Jeannine Boga M.D.    On: 05/11/2022 21:04  I have personally reviewed the images and agree with the above interpretation.  Assessment and Plan: Ms. Wisenbaker is a pleasant 71 y.o. female with right lower extremity weakness.  She reported some symptoms concerning for right L4 or L5 radiculopathy versus peroneal neuropathy or chemotherapy-induced neuropathy.  She does not report the symptoms to me today.  I would not recommend surgical intervention at this time.  Would recommend a trial of steroids.  Otherwise, would recommend as needed pain control,  physical therapy, and outpatient referral to physiatry when she is ready for discharge.    Thank you for involving me in the care of this patient.      Benny Deutschman K. Izora Ribas MD, [facility] Neurosurgery

## 2022-05-12 NOTE — Care Management Important Message (Signed)
Important Message  Patient Details  Name: Cynthia Dean MRN: [id number] Date of Birth: 1951-01-08   Medicare Important Message Given:  N/A - LOS <3 / Initial given by admissions     [name] 05/12/2022, 12:16 PM

## 2022-05-12 NOTE — Progress Notes (Signed)
Occupational Therapy Treatment Patient Details Name: Kai Railsback MRN: [id number] DOB: 09/25/1950 Today's Date: 05/12/2022   History of present illness Pt is a 71 y.o. female presenting to hospital 10/15 with c/o R leg pain; unable to walk secondary to pain; has had R LE pain for more than 3 months but now worsened.  PMH includes ESRD on HD MWF, acute on chronic respiratory failure with hypoxia, COPD, CAD, cryptogenic stroke, DM, diarrhea, GI bleed, heart murmur, PNA d/t COVID-19 virus, seizure, PE.   OT comments  Patient in bed upon arrival, agreeable to OT services. Family present in room. Patient required Max A +2 for physical assistance for bed mobility suoine <> EOB. Poor sitting balance observed, posterior lean. Patient required manual facilitation to complete anterior weight shift. While sitting EOB patient completed grooming tasks with set up (oral care and washing face). Functional reaching tasks completed. Requiring VC for anterior weight shift. Patient left in bed with call bell in reach, bed alarm set, family present, and all needs met.    Recommendations for follow up therapy are one component of a multi-disciplinary discharge planning process, led by the attending physician.  Recommendations may be updated based on patient status, additional functional criteria and insurance authorization.    Follow Up Recommendations  Skilled nursing-short term rehab (<3 hours/day)    Assistance Recommended at Discharge Frequent or constant Supervision/Assistance  Patient can return home with the following  Two people to help with walking and/or transfers;A lot of help with bathing/dressing/bathroom;Assistance with cooking/housework;Assist for transportation;Help with stairs or ramp for entrance   Equipment Recommendations  Other (comment) (Defer to next venue of care.)       Precautions / Restrictions Precautions Precautions: Fall Precaution Comments: Seizure; L UE  fistula Restrictions Weight Bearing Restrictions: No       Mobility Bed Mobility Overal bed mobility: Needs Assistance Bed Mobility: Supine to Sit, Sit to Supine     Supine to sit: Max assist, +2 for physical assistance Sit to supine: Max assist, +2 for physical assistance        Transfers                   General transfer comment: not attempted     Balance Overall balance assessment: Needs assistance Sitting-balance support: Feet supported, No upper extremity supported Sitting balance-Leahy Scale: Poor Sitting balance - Comments: Posterior lean observed. Patient required VC for anterior weight shift. Requiring max A for upright position Postural control: Posterior lean                                 ADL either performed or assessed with clinical judgement   ADL       Grooming: Oral care;Wash/dry face;Set up                                      Extremity/Trunk Assessment Upper Extremity Assessment Upper Extremity Assessment: Generalized weakness   Lower Extremity Assessment Lower Extremity Assessment: Generalized weakness         Cognition Arousal/Alertness: Awake/alert Behavior During Therapy: WFL for tasks assessed/performed Overall Cognitive Status: Within Functional Limits for tasks assessed  Pertinent Vitals/ Pain       Pain Assessment Pain Assessment: No/denies pain   Frequency  Min 2X/week        Progress Toward Goals  OT Goals(current goals can now be found in the care plan section)  Progress towards OT goals: Progressing toward goals  Acute Rehab OT Goals Patient Stated Goal: to return home OT Goal Formulation: With patient Time For Goal Achievement: 05/24/22 Potential to Achieve Goals: Good   AM-PAC OT "6 Clicks" Daily Activity     Outcome Measure   Help from another person eating meals?: None Help from another  person taking care of personal grooming?: A Little Help from another person toileting, which includes using toliet, bedpan, or urinal?: A Lot Help from another person bathing (including washing, rinsing, drying)?: A Lot Help from another person to put on and taking off regular upper body clothing?: A Little Help from another person to put on and taking off regular lower body clothing?: A Lot 6 Click Score: 16    End of Session Equipment Utilized During Treatment:  (None)  OT Visit Diagnosis: Unsteadiness on feet (R26.81);Muscle weakness (generalized) (M62.81);Pain   Activity Tolerance Patient tolerated treatment well   Patient Left in bed;with call bell/phone within reach;with bed alarm set;with family/visitor present   Nurse Communication Mobility status        Time: 8828-0034 OT Time Calculation (min): 25 min  Charges: OT General Charges $OT Visit: 1 Visit OT Treatments $Self Care/Home Management : 8-22 mins $Therapeutic Activity: 8-22 mins     Cornellius Kropp, OTS 05/12/2022, 2:00 PM

## 2022-05-12 NOTE — Progress Notes (Signed)
Patient [facility]  ROUNDING NOTE   Subjective:   Cynthia Dean is a 71 y.o. 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 Patient presents to ED with complaints of right leg pain and weakness. She has been admitted for Right leg pain [M79.604] ESRD on dialysis ([place]) [N18.6, Z99.2] Generalized weakness [R53.1] Unable to ambulate [R26.2] Diarrhea, unspecified type [R19.7]  Patient is known to our practice and receives outpatient dialysis treatments at [facility] on a MWF schedule, supervised by Dr Candiss Norse. Her last dialysis treatment was Friday and she denies missing any recent treatments. Patient reports pain in right leg began last week after receiving an MRI. She states her leg "squeezed" and she has had pain since then. Denies recent illness, nausea, vomiting or diarrhea. Denies shortness of breath or chest pain.   Labs on ED arrival unremarkable for renal patient. CK 29. Chest x-ray shows vascular congestion. Hip X ray negative for fracture.  We have been consulted to manage dialysis needs.   Patient was seen today on second floor. Pt resting in bed.     Objective:  Vital signs in last 24 hours:  Temp:  [98 F (36.7 C)-99.4 F (37.4 C)] 99.4 F (37.4 C) (10/19 0437) Pulse Rate:  [68-81] 71 (10/19 0437) Resp:  [16-22] 18 (10/19 0437) BP: (115-192)/(51-97) 130/58 (10/19 0437) SpO2:  [87 %-100 %] 97 % (10/19 0437) Weight:  [109.6 kg-111.6 kg] 110.1 kg (10/19 0500)  Weight change: -2.4 kg Filed Weights   05/11/22 0953 05/11/22 1408 05/12/22 0500  Weight: 111.6 kg 109.6 kg 110.1 kg    Intake/Output: I/O last 3 completed shifts: In: 240 [P.O.:240] Out: 2000 [Other:2000]   Intake/Output this shift:  No intake/output data recorded.  Physical Exam: General: NAD  Head: Normocephalic, atraumatic. Moist oral mucosal  membranes  Eyes: Anicteric  Lungs:  Diminished in bases, normal effort , [place] O2  Heart: Regular rate and rhythm  Abdomen:  Soft, nontender, obese  Extremities:  No peripheral edema.  Neurologic: Nonfocal, moving all four extremities  Skin: No lesions  Access: Lt upper AVG    Basic Metabolic Panel: Recent Labs  Lab 05/09/22 0054 05/10/22 0515 05/11/22 0940 05/12/22 0652  NA 139 139 137 139  K 4.0 3.9 4.6 4.2  CL 106 102 100 99  CO2 '24 25 25 28  '$ GLUCOSE 164* 149* 131* 153*  BUN 42* 27* 50* 35*  CREATININE 9.38* 6.53* 8.81* 6.45*  CALCIUM 8.6* 8.6* 8.6* 8.4*  MG  --   --   --  1.8  PHOS  --   --  6.5*  --     Liver Function Tests: Recent Labs  Lab 05/11/22 0940  ALBUMIN 2.7*   No results for input(s): "LIPASE", "AMYLASE" in the last 168 hours. No results for input(s): "AMMONIA" in the last 168 hours.  CBC: Recent Labs  Lab 05/09/22 0054 05/11/22 0940 05/12/22 0652  WBC 10.2 8.7 9.0  NEUTROABS  --  6.0  --   HGB 8.4* 8.0* 8.0*  HCT 27.4* 24.8* 25.3*  MCV 89.8 85.8 86.6  PLT 254 221 208    Cardiac Enzymes: Recent Labs  Lab 05/09/22 0054  CKTOTAL 29*    BNP: Invalid input(s): "POCBNP"  CBG: Recent Labs  Lab 05/10/22 1757 05/10/22 2112 05/11/22 0829 05/11/22 1622 05/11/22 2121  GLUCAP 133* 97 114* 128* 182*    Microbiology: Results for orders  placed or performed during the hospital encounter of 03/19/22  Resp Panel by RT-PCR (Flu A&B, Covid) Anterior Nasal Swab     Status: None   Collection Time: 03/19/22  9:28 PM   Specimen: Anterior Nasal Swab  Result Value Ref Range Status   SARS Coronavirus 2 by RT PCR NEGATIVE NEGATIVE Final    Comment: (NOTE) SARS-CoV-2 target nucleic acids are NOT DETECTED.  The SARS-CoV-2 RNA is generally detectable in upper respiratory specimens during the acute phase of infection. The lowest concentration of SARS-CoV-2 viral copies this assay can detect is 138 copies/mL. A negative result does not preclude  SARS-Cov-2 infection and should not be used as the sole basis for treatment or other patient management decisions. A negative result may occur with  improper specimen collection/handling, submission of specimen other than nasopharyngeal swab, presence of viral mutation(s) within the areas targeted by this assay, and inadequate number of viral copies(<138 copies/mL). A negative result must be combined with clinical observations, patient history, and epidemiological information. The expected result is Negative.  Fact Sheet for Patients:  EntrepreneurPulse.com.au  Fact Sheet for Healthcare Providers:  IncredibleEmployment.be  This test is no t yet approved or cleared by the [place] FDA and  has been authorized for detection and/or diagnosis of SARS-CoV-2 by FDA under an Emergency Use Authorization (EUA). This EUA will remain  in effect (meaning this test can be used) for the duration of the COVID-19 declaration under Section 564(b)(1) of the Act, 21 U.S.C.section 360bbb-3(b)(1), unless the authorization is terminated  or revoked sooner.       Influenza A by PCR NEGATIVE NEGATIVE Final   Influenza B by PCR NEGATIVE NEGATIVE Final    Comment: (NOTE) The Xpert Xpress SARS-CoV-2/FLU/RSV plus assay is intended as an aid in the diagnosis of influenza from Nasopharyngeal swab specimens and should not be used as a sole basis for treatment. Nasal washings and aspirates are unacceptable for Xpert Xpress SARS-CoV-2/FLU/RSV testing.  Fact Sheet for Patients: EntrepreneurPulse.com.au  Fact Sheet for Healthcare Providers: IncredibleEmployment.be  This test is not yet approved or cleared by the [place] FDA and has been authorized for detection and/or diagnosis of SARS-CoV-2 by FDA under an Emergency Use Authorization (EUA). This EUA will remain in effect (meaning this test can be used) for the duration of  the COVID-19 declaration under Section 564(b)(1) of the Act, 21 U.S.C. section 360bbb-3(b)(1), unless the authorization is terminated or revoked.  Performed at [facility], [address] [place], [place] [address]   Blood Culture (routine x 2)     Status: Abnormal   Collection Time: 03/19/22  9:28 PM   Specimen: BLOOD  Result Value Ref Range Status   Specimen Description   Final    BLOOD RIGHT ANTECUBITAL Performed at [facility], [address] [place], [place] [address]    Special Requests   Final    BOTTLES DRAWN AEROBIC AND ANAEROBIC Blood Culture adequate volume Performed at [facility], [address] [place], [place] [address]    Culture  Setup Time   Final    GRAM POSITIVE COCCI IN BOTH AEROBIC AND ANAEROBIC BOTTLES CRITICAL RESULT CALLED TO, READ BACK BY AND VERIFIED WITH: CAROLYN CHILDS 03/20/22 1457 AMK Performed at [facility], [address] [place],  [address]    Culture STAPHYLOCOCCUS CAPITIS (A)  Final   Report Status 03/23/2022 FINAL  Final   Organism ID, Bacteria STAPHYLOCOCCUS CAPITIS  Final      Susceptibility  Staphylococcus capitis - MIC*    CIPROFLOXACIN <=0.5 SENSITIVE Sensitive     ERYTHROMYCIN <=0.25 SENSITIVE Sensitive     GENTAMICIN <=0.5 SENSITIVE Sensitive     OXACILLIN <=0.25 SENSITIVE Sensitive     TETRACYCLINE <=1 SENSITIVE Sensitive     VANCOMYCIN <=0.5 SENSITIVE Sensitive     TRIMETH/SULFA <=10 SENSITIVE Sensitive     CLINDAMYCIN <=0.25 SENSITIVE Sensitive     RIFAMPIN <=0.5 SENSITIVE Sensitive     Inducible Clindamycin NEGATIVE Sensitive     * STAPHYLOCOCCUS CAPITIS  Blood Culture ID Panel (Reflexed)     Status: Abnormal   Collection Time: 03/19/22  9:28 PM  Result Value Ref Range Status   Enterococcus faecalis NOT DETECTED NOT DETECTED Final   Enterococcus Faecium NOT DETECTED NOT DETECTED Final   Listeria monocytogenes NOT DETECTED NOT DETECTED Final   Staphylococcus species  DETECTED (A) NOT DETECTED Final    Comment: CRITICAL RESULT CALLED TO, READ BACK BY AND VERIFIED WITH: CAROLYN CHILDS 03/20/22 1457 AMK    Staphylococcus aureus (BCID) NOT DETECTED NOT DETECTED Final   Staphylococcus epidermidis NOT DETECTED NOT DETECTED Final   Staphylococcus lugdunensis NOT DETECTED NOT DETECTED Final   Streptococcus species NOT DETECTED NOT DETECTED Final   Streptococcus agalactiae NOT DETECTED NOT DETECTED Final   Streptococcus pneumoniae NOT DETECTED NOT DETECTED Final   Streptococcus pyogenes NOT DETECTED NOT DETECTED Final   A.calcoaceticus-baumannii NOT DETECTED NOT DETECTED Final   Bacteroides fragilis NOT DETECTED NOT DETECTED Final   Enterobacterales NOT DETECTED NOT DETECTED Final   Enterobacter cloacae complex NOT DETECTED NOT DETECTED Final   Escherichia coli NOT DETECTED NOT DETECTED Final   Klebsiella aerogenes NOT DETECTED NOT DETECTED Final   Klebsiella oxytoca NOT DETECTED NOT DETECTED Final   Klebsiella pneumoniae NOT DETECTED NOT DETECTED Final   Proteus species NOT DETECTED NOT DETECTED Final   Salmonella species NOT DETECTED NOT DETECTED Final   Serratia marcescens NOT DETECTED NOT DETECTED Final   Haemophilus influenzae NOT DETECTED NOT DETECTED Final   Neisseria meningitidis NOT DETECTED NOT DETECTED Final   Pseudomonas aeruginosa NOT DETECTED NOT DETECTED Final   Stenotrophomonas maltophilia NOT DETECTED NOT DETECTED Final   Candida albicans NOT DETECTED NOT DETECTED Final   Candida auris NOT DETECTED NOT DETECTED Final   Candida glabrata NOT DETECTED NOT DETECTED Final   Candida krusei NOT DETECTED NOT DETECTED Final   Candida parapsilosis NOT DETECTED NOT DETECTED Final   Candida tropicalis NOT DETECTED NOT DETECTED Final   Cryptococcus neoformans/gattii NOT DETECTED NOT DETECTED Final    Comment: Performed at [facility], [address] [place], [place] [address]  Blood Culture (routine x 2)     Status: Abnormal   Collection  Time: 03/19/22 10:23 PM   Specimen: BLOOD  Result Value Ref Range Status   Specimen Description   Final    BLOOD BLOOD RIGHT FOREARM Performed at [facility], [address], [place], [place] [address]    Special Requests   Final    BOTTLES DRAWN AEROBIC AND ANAEROBIC Blood Culture adequate volume Performed at [facility], [address] [place], [place] [address]    Culture  Setup Time   Final    GRAM POSITIVE COCCI IN BOTH AEROBIC AND ANAEROBIC BOTTLES CRITICAL RESULT CALLED TO, READ BACK BY AND VERIFIED WITH: ELVERA MADUEME 03/20/22 1545 AMK Performed at [facility], [address], [place], [place] [address]    Culture (A)  Final    STAPHYLOCOCCUS CAPITIS SUSCEPTIBILITIES  PERFORMED ON PREVIOUS CULTURE WITHIN THE LAST 5 DAYS. Performed at [facility], [place] [address], [place], [place] [address]    Report Status 03/22/2022 FINAL  Final  Urine Culture     Status: Abnormal   Collection Time: 03/20/22  1:25 AM   Specimen: In/Out Cath Urine  Result Value Ref Range Status   Specimen Description   Final    IN/OUT CATH URINE Performed at [facility], [address] [place], [place] [address]    Special Requests   Final    NONE Performed at [facility], [address] [place], [place] [address]    Culture 4,000 COLONIES/mL STAPHYLOCOCCUS EPIDERMIDIS (A)  Final   Report Status 03/22/2022 FINAL  Final   Organism ID, Bacteria STAPHYLOCOCCUS EPIDERMIDIS (A)  Final      Susceptibility   Staphylococcus epidermidis - MIC*    CIPROFLOXACIN <=0.5 SENSITIVE Sensitive     GENTAMICIN <=0.5 SENSITIVE Sensitive     NITROFURANTOIN <=16 SENSITIVE Sensitive     OXACILLIN <=0.25 SENSITIVE Sensitive     TETRACYCLINE <=1 SENSITIVE Sensitive     VANCOMYCIN 2 SENSITIVE Sensitive     TRIMETH/SULFA 80 RESISTANT Resistant     CLINDAMYCIN >=8 RESISTANT Resistant     RIFAMPIN <=0.5 SENSITIVE Sensitive     Inducible Clindamycin NEGATIVE  Sensitive     * 4,000 COLONIES/mL STAPHYLOCOCCUS EPIDERMIDIS  Culture, blood (Routine X 2) w Reflex to ID Panel     Status: None   Collection Time: 03/21/22 11:00 AM   Specimen: BLOOD  Result Value Ref Range Status   Specimen Description BLOOD BLOOD RIGHT FOREARM  Final   Special Requests   Final    BOTTLES DRAWN AEROBIC AND ANAEROBIC Blood Culture adequate volume   Culture   Final    NO GROWTH 5 DAYS Performed at [facility], [address] [place], [place] [address]    Report Status 03/26/2022 FINAL  Final  Culture, blood (Routine X 2) w Reflex to ID Panel     Status: None   Collection Time: 03/22/22 11:53 AM   Specimen: BLOOD RIGHT HAND  Result Value Ref Range Status   Specimen Description BLOOD RIGHT HAND  Final   Special Requests   Final    BOTTLES DRAWN AEROBIC AND ANAEROBIC Blood Culture adequate volume   Culture   Final    NO GROWTH 5 DAYS Performed at [facility], [address], [place], [place] [address]    Report Status 03/27/2022 FINAL  Final  Aerobic/Anaerobic Culture w Gram Stain (surgical/deep wound)     Status: None   Collection Time: 03/25/22 10:30 AM   Specimen: Wound; Blood  Result Value Ref Range Status   Specimen Description WOUND LEFT BREAST  Final   Special Requests BLOOD HEMATOMA  Final   Gram Stain   Final    RARE WBC PRESENT, PREDOMINANTLY MONONUCLEAR NO ORGANISMS SEEN    Culture   Final    No growth aerobically or anaerobically. Performed at [facility], [place] [address], [place], [place] [address]    Report Status 03/30/2022 FINAL  Final    Coagulation Studies: No results for input(s): "LABPROT", "INR" in the last 72 hours.  Urinalysis: No results for input(s): "COLORURINE", "LABSPEC", "PHURINE", "GLUCOSEU", "HGBUR", "BILIRUBINUR", "KETONESUR", "PROTEINUR", "UROBILINOGEN", "NITRITE", "LEUKOCYTESUR" in the last 72 hours.  Invalid input(s): "APPERANCEUR"    Imaging: MR LUMBAR SPINE WO CONTRAST  Result Date:  05/11/2022 CLINICAL DATA:  Initial evaluation for  lumbar radiculopathy, progressively worsening right leg pain. History of end-stage renal disease on hemodialysis. EXAM: MRI LUMBAR SPINE WITHOUT CONTRAST TECHNIQUE: Multiplanar, multisequence MR imaging of the lumbar spine was performed. No intravenous contrast was administered. COMPARISON:  Prior MRI from 01/21/2008. FINDINGS: Segmentation: Standard. Lowest well-formed disc space labeled the L5-S1 level. Alignment: Trace 2 mm facet mediated anterolisthesis of L3 on L4 and L4 on L5, with trace retrolisthesis of L5 on S1. Vertebrae: Vertebral body height maintained without acute or chronic fracture. Bone marrow signal intensity within normal limits. No worrisome osseous lesions. No abnormal marrow edema. Conus medullaris and cauda equina: Conus extends to the L2 level. Conus and cauda equina appear normal. Paraspinal and other soft tissues: Mild edema within the lower posterior paraspinous soft tissues adjacent to the L4-5 and L5-S1 facets, likely reactive in nature due to adjacent facet arthritis. No collections. Atrophic multi-cystic kidneys, consistent with history of end-stage renal disease and dialysis. 1.8 cm simple cyst partially visualize within the left adnexa. This is almost certainly benign given size, with no follow-up imaging recommended. Note: This recommendation does not apply to premenarchal patients and to those with increased risk (genetic, family history, elevated tumor markers or other high-risk factors) of ovarian cancer. Reference: JACR 2020 Feb; 17(2):248-254 Disc levels: T12-L1: Disc desiccation without significant disc bulge. Bilateral facet hypertrophy. No significant spinal stenosis. Foramina remain patent. L1-2: Disc desiccation without significant disc bulge. Mild facet hypertrophy. No canal or foraminal stenosis. L2-3: Disc desiccation with mild annular disc bulge. Mild to moderate facet hypertrophy. Rounded 6 mm nodular lesion at the  anteromedial aspect of the left L2-3 facet favored to reflect a small complex synovial cyst. Resultant mild canal with moderate left lateral recess stenosis. Mild bilateral foraminal narrowing. L3-4: Mild disc bulge with disc desiccation. Moderate facet and ligament flavum hypertrophy with associated trace joint effusions. No significant spinal stenosis. Moderate bilateral L3 foraminal narrowing. L4-5: Trace anterolisthesis. Diffuse disc bulge with disc desiccation. Disc bulging slightly eccentric to the left. Moderate facet and ligament flavum hypertrophy with associated small joint effusions. Resultant mild canal with moderate bilateral subarticular stenosis. Resultant moderate left with mild to moderate right L4 foraminal narrowing. L5-S1: Trace retrolisthesis. Degenerative intervertebral disc space narrowing with diffuse disc bulge and disc desiccation. Associated reactive endplate spurring. Superimposed small central disc protrusion with annular fissure indents the ventral thecal sac. Mild facet hypertrophy. No significant spinal stenosis. Moderate bilateral L5 foraminal narrowing. IMPRESSION: 1. Multifactorial degenerative changes at L4-5 with resultant moderate bilateral subarticular stenosis, with moderate left greater than right L4 foraminal narrowing. 2. Moderate bilateral L3 and L5 foraminal stenosis related to disc bulge, reactive endplate spurring, and facet degeneration. 3. 6 mm nodular lesion at the anteromedial aspect of the left L2-3 facet, favored to reflect a small complex synovial cyst. Resultant mild canal with moderate left lateral recess stenosis at this level. 4. Lower lumbar facet hypertrophy, most pronounced at L4-5. Finding could contribute to lower back pain. Electronically Signed   By: Jeannine Boga M.D.   On: 05/11/2022 21:04   CT TIBIA FIBULA RIGHT WO CONTRAST  Result Date: 05/11/2022 CLINICAL DATA:  Foot pain, persistent posttraumatic. Right leg pain with weakness and  inability to ambulate. EXAM: CT OF THE LOWER RIGHT EXTREMITY WITHOUT CONTRAST TECHNIQUE: Multidetector CT imaging of the right lower leg was performed according to the standard protocol. RADIATION DOSE REDUCTION: This exam was performed according to the departmental dose-optimization program which includes automated exposure control, adjustment of the mA and/or kV according to  patient size and/or use of iterative reconstruction technique. COMPARISON:  Right hip and lower leg radiographs 05/09/2022. Doppler ultrasound 03/20/2022. FINDINGS: Bones/Joint/Cartilage Images extend from just above the right knee through the right ankle. The right foot is incompletely visualized. There is no evidence of acute fracture or dislocation. Moderate degenerative changes are present in the patellofemoral and medial compartments of the right knee. No significant knee joint effusion. No significant ankle joint effusion or arthropathy. Ligaments Suboptimally assessed by CT. Muscles and Tendons Mild generalized muscular atrophy. The patellar tendon is intact. The ankle tendons appear intact, although not all of their distal insertions are included. Soft tissues Prominent diffuse vascular calcifications. No focal fluid collection, foreign body or soft tissue emphysema. Minimal subcutaneous edema anteriorly in the distal lower leg. IMPRESSION: 1. No acute osseous findings identified in the right lower leg. 2. Moderate degenerative changes in the patellofemoral and medial compartments of the right knee. 3. Diffuse vascular calcifications. 4. Mild diffuse muscular atrophy. Electronically Signed   By: Richardean Sale M.D.   On: 05/11/2022 15:19     Medications:    anticoagulant sodium citrate      apixaban  5 mg Oral BID   aspirin EC  81 mg Oral Daily   Chlorhexidine Gluconate Cloth  6 each Topical Q0600   vitamin D3  1,000 Units Oral Daily   cinacalcet  30 mg Oral Q breakfast   gabapentin  300 mg Oral BID   insulin aspart  0-5  Units Subcutaneous QHS   insulin aspart  0-9 Units Subcutaneous TID WC   levETIRAcetam  250 mg Oral Q M,W,F-1800   lidocaine  1 patch Transdermal Q24H   losartan  100 mg Oral QHS   metoprolol succinate  100 mg Oral Daily   pantoprazole  40 mg Oral Daily   sertraline  25 mg Oral Daily   torsemide  100 mg Oral Daily   traZODone  25 mg Oral QHS   acetaminophen, alteplase, anticoagulant sodium citrate, diphenoxylate-atropine, fentaNYL (SUBLIMAZE) injection, fluticasone, guaiFENesin, heparin, hydrALAZINE, HYDROmorphone, ipratropium-albuterol, lidocaine (PF), lidocaine-prilocaine, LORazepam, metoprolol tartrate, ondansetron (ZOFRAN) IV, pentafluoroprop-tetrafluoroeth, senna-docusate, traZODone  Assessment/ Plan:  Cynthia Dean is a 71 y.o.  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 Patient presents to ED with complaints of right leg pain and weakness. She has been admitted for Right leg pain [M79.604] ESRD on dialysis ([place]) [N18.6, Z99.2] Generalized weakness [R53.1] Unable to ambulate [R26.2] Diarrhea, unspecified type [R19.7]  [facility]/MWF/Lt AVG  End stage renal disease on hemodialysis.        Patient is a Monday Wednesday Friday schedule       Patient was last dialyzed yesterday       No need for renal placement therapy today   2. Anemia of chronic kidney disease, Normocytic Lab Results  Component Value Date   HGB 8.0 (L) 05/12/2022    Hgb below target. Patient receives [place] outpatient. Will continue to monitor and assess need for ESA.   3. Secondary Hyperparathyroidism: with outpatient labs: PTH 613, phosphorus 4.8, calcium 9.0 on 03/07/22.    Lab Results  Component Value Date   PTH 131 (H) 03/14/2018   CALCIUM 8.4 (L) 05/12/2022   PHOS 6.5 (H) 05/11/2022     Calcium and phosphorus within desired range.   4.  Hypertension with chronic kidney disease. Home regimen includes losartan, metoprolol, and torsemide. All held  5. Diabetes mellitus type II with chronic kidney disease: insulin dependent.. Patient is being followed by the primary team    6.Inability to ambulate secondary to right leg pain. Primary team , ortho and Neurosurgery is following   LOS: 3 [place] Ahmar Pickrell 10/19/20237:57 AM

## 2022-05-13 DIAGNOSIS — R262 Difficulty in walking, not elsewhere classified: Secondary | ICD-10-CM | POA: Diagnosis not present

## 2022-05-13 LAB — GLUCOSE, CAPILLARY
Glucose-Capillary: 138 mg/dL — ABNORMAL HIGH (ref 70–99)
Glucose-Capillary: 228 mg/dL — ABNORMAL HIGH (ref 70–99)
Glucose-Capillary: 264 mg/dL — ABNORMAL HIGH (ref 70–99)

## 2022-05-13 LAB — CBC
HCT: 28.3 % — ABNORMAL LOW (ref 36.0–46.0)
Hemoglobin: 9 g/dL — ABNORMAL LOW (ref 12.0–15.0)
MCH: 27.6 pg (ref 26.0–34.0)
MCHC: 31.8 g/dL (ref 30.0–36.0)
MCV: 86.8 fL (ref 80.0–100.0)
Platelets: 253 10*3/uL (ref 150–400)
RBC: 3.26 MIL/uL — ABNORMAL LOW (ref 3.87–5.11)
RDW: 14.4 % (ref 11.5–15.5)
WBC: 8.8 10*3/uL (ref 4.0–10.5)
nRBC: 0 % (ref 0.0–0.2)

## 2022-05-13 LAB — BASIC METABOLIC PANEL
Anion gap: 12 (ref 5–15)
BUN: 55 mg/dL — ABNORMAL HIGH (ref 8–23)
CO2: 28 mmol/L (ref 22–32)
Calcium: 8.7 mg/dL — ABNORMAL LOW (ref 8.9–10.3)
Chloride: 94 mmol/L — ABNORMAL LOW (ref 98–111)
Creatinine, Ser: 8.11 mg/dL — ABNORMAL HIGH (ref 0.44–1.00)
GFR, Estimated: 5 mL/min — ABNORMAL LOW (ref >60)
Glucose, Bld: 191 mg/dL — ABNORMAL HIGH (ref 70–99)
Potassium: 5.4 mmol/L — ABNORMAL HIGH (ref 3.5–5.1)
Sodium: 134 mmol/L — ABNORMAL LOW (ref 135–145)

## 2022-05-13 LAB — FOLATE: Folate: 6 ng/mL (ref >5.9)

## 2022-05-13 LAB — MAGNESIUM: Magnesium: 2.1 mg/dL (ref 1.7–2.4)

## 2022-05-13 NOTE — Progress Notes (Signed)
Hemodialysis Note  Received patient in bed to unit. Alert and oriented. Informed consent signed and in chart.   Treatment initiated:0805 Treatment completed:1159  Patient tolerated treatment well. Transported back to the room alert, without acute distress. Report given to patient's RN.  Access used: LUA AVF  Access issues: None   Total UF removed: 2000 ml Medications given:None Post HD VS: Stable  Post HD weight:106.6 kg   Forrest Moron, RN [facility]

## 2022-05-13 NOTE — TOC Progression Note (Signed)
Transition of Care [facility]) - Progression Note    Patient Details  Name: Cynthia Dean MRN: [id number] Date of Birth: 06/24/1951  Transition of [facility]) CM/SW Contact  Laurena Slimmer, RN Phone Number: 05/13/2022, 3:31 PM  Clinical Narrative:    Retrieved message from [place], [place] at [place] is requesting more physical therapy notes to make a determination. Assigned PT and MD notified.   1:30pm  Spoke with patient at bedside regarding discharge plan and insurance auth. Patient advised more clinical information was needed to make a determination. Patient encouraged.   3:36pm Updated therapy notes uploaded to [employer]. Call placed to [facility]. Spoke with Marissa to confirm notes were received. Receipt of notes confirmed. [place] notified.     Expected Discharge Plan:  (TBD) Barriers to Discharge: Continued Medical Work up  Expected Discharge Plan and Services Expected Discharge Plan:  (TBD)       Living arrangements for the past 2 months: Single Family Home                                       Social Determinants of Health (SDOH) Interventions    Readmission Risk Interventions    10/25/2019    8:43 AM 10/24/2019   10:14 AM  Readmission Risk Prevention Plan  Transportation Screening Complete   PCP or Specialist Appt within 3-5 Days Not Complete   HRI or Home Care Consult Complete Complete  Palliative Care Screening Not Applicable   Medication Review (RN Care Manager) Complete

## 2022-05-13 NOTE — Progress Notes (Signed)
PROGRESS NOTE    Cynthia Dean  ZOX:[id number] DOB: 05/16/1951 DOA: 05/09/2022 PCP: Lowella Bandy, MD   Brief Narrative:  71 year old with history of ESRD on HD MWF, HTN, HLD, DM 2, CVA, depression, CAD, PE on Eliquis, seizure, diastolic CHF, anemia, obesity admitted for right leg pain, weakness and unable to ambulate.  Apparently some of the symptoms have been going on for about 3 months.  Negative right lower extremity Doppler on 10/13.  PT/OT recommending rehab.  Eventually due to right lower extremity pain and back discomfort CT of the tib-fib and MRI lumbar spine was done which showed chronic changes and radiculopathy/neuropathy.  Neurosurgery and orthopedic recommended trial of steroids, therapy and outpatient follow-up with physiatry.   Assessment & Plan:  Principal Problem:   Unable to ambulate Active Problems:   Right leg pain   ESRD on dialysis ([place])   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-stage renal disease) (HCC)   CAD (coronary artery disease)   Chronic diastolic CHF (congestive heart failure) (HCC)   Essential hypertension   COPD (chronic obstructive pulmonary disease) (HCC)   Depression   Dyslipidemia   Pulmonary emboli (HCC)   Seizure (HCC)   Stroke (HCC)   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[place])   Obesity with body mass index (BMI) of 30.0 to 39.9   Diarrhea   Generalized weakness    * Unable to ambulate; Right LE Pain Chronic lumbar radiculopathy along with peroneal/peripheral neuropathy -Secondary to severe pain in the right leg.  X-ray negative, lower extremity Dopplers is also negative for DVT.  Does have good pulses.  PT/OT recommending SNF.  Currently Lipitor is on hold.  Neurontin 20 mg twice daily started. CK - 29 CT and lumbar spine reviewed showing chronic changes.  Not a good surgical candidate.  Seen by neuro and orthopedic-recommending now trial of for steroids, PT/OT and outpatient follow-up with physiatry    ESRD on  dialysis [facility]) MWF -Nephro following for HD   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-stage renal disease) ([place]) Hemoglobin stable 8.4 (7.8 on 04/11/2022).  Sensipar -Follow-up with CBC   CAD (coronary artery disease) No chest pain, on aspirin and Eliquis.  Statin on hold   Chronic diastolic CHF (congestive heart failure) ([place]) Patient does not have leg edema.  CHF is compensated.  2D echo 03/22/2022 showed EF of 50 to 55% with grade 2 diastolic dysfunction.  Volume status will be managed with HD.  On daily torsemide   Essential hypertension -Cozaar, Toprol-XL.  Torsemide     COPD (chronic obstructive pulmonary disease) (HCC) Stable - Bronchodilators   Depression - Continue home medications   Dyslipidemia - Hold Lipitor as above   Pulmonary emboli (HCC) - Continue Eliquis   Seizure (HCC) -Keppra     Stroke ([place]) -On aspirin, Eliquis and statin     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) Recent A1c 5.  8, well controlled.  Patient's not taking 70/30 insulin currently. -Sliding scale insulin   Obesity with body mass index (BMI) of 30.0 to 39.9   BMI= 37.02,   and BW= 113.7Kg -Diet and exercise.   -Encourage to lose weight.     Diarrhea -Resolved    PT/OT= SNF.  TOC consulted   DVT prophylaxis:  apixaban (ELIQUIS) tablet 5 mg  Code Status: Full Family Communication:    Status is: Inpatient Remains inpatient appropriate because: Continue to work with physical therapy.  In the meantime we will have TOC start  working on placement   Subjective: Patient seen in dialysis, no new complaints.  Resting comfortably.  Examination: Constitutional: Not in acute distress Respiratory: Clear to auscultation bilaterally Cardiovascular: Normal sinus rhythm, no rubs Abdomen: Nontender nondistended good bowel sounds Musculoskeletal: No edema noted.  Slight left foot drop Skin: No rashes seen Neurologic: CN 2-12 grossly intact.  And nonfocal Psychiatric: Normal  judgment and insight. Alert and oriented x 3. Normal mood.   Objective: Vitals:   05/12/22 1908 05/13/22 0404 05/13/22 0500 05/13/22 0610  BP:  (!) 155/71    Pulse: 66 (!) 59    Resp:  18    Temp:  98.8 F (37.1 C)    TempSrc:      SpO2: 90% 91%  94%  Weight:   109.6 kg   Height:        Intake/Output Summary (Last 24 hours) at 05/13/2022 0758 Last data filed at 05/12/2022 2040 Gross per 24 hour  Intake 460 ml  Output --  Net 460 ml   Filed Weights   05/11/22 1408 05/12/22 0500 05/13/22 0500  Weight: 109.6 kg 110.1 kg 109.6 kg     Data Reviewed:   CBC: Recent Labs  Lab 05/09/22 0054 05/11/22 0940 05/12/22 0652 05/13/22 0439  WBC 10.2 8.7 9.0 8.8  NEUTROABS  --  6.0  --   --   HGB 8.4* 8.0* 8.0* 9.0*  HCT 27.4* 24.8* 25.3* 28.3*  MCV 89.8 85.8 86.6 86.8  PLT 254 221 208 423   Basic Metabolic Panel: Recent Labs  Lab 05/09/22 0054 05/10/22 0515 05/11/22 0940 05/12/22 0652 05/13/22 0439  NA 139 139 137 139 134*  K 4.0 3.9 4.6 4.2 5.4*  CL 106 102 100 99 94*  CO2 '24 25 25 28 28  '$ GLUCOSE 164* 149* 131* 153* 191*  BUN 42* 27* 50* 35* 55*  CREATININE 9.38* 6.53* 8.81* 6.45* 8.11*  CALCIUM 8.6* 8.6* 8.6* 8.4* 8.7*  MG  --   --   --  1.8 2.1  PHOS  --   --  6.5*  --   --    GFR: Estimated Creatinine Clearance: 8.4 mL/min (A) (by C-G formula based on SCr of 8.11 mg/dL (H)). Liver Function Tests: Recent Labs  Lab 05/11/22 0940  ALBUMIN 2.7*   No results for input(s): "LIPASE", "AMYLASE" in the last 168 hours. No results for input(s): "AMMONIA" in the last 168 hours. Coagulation Profile: No results for input(s): "INR", "PROTIME" in the last 168 hours. Cardiac Enzymes: Recent Labs  Lab 05/09/22 0054  CKTOTAL 29*   BNP (last 3 results) No results for input(s): "PROBNP" in the last 8760 hours. HbA1C: No results for input(s): "HGBA1C" in the last 72 hours. CBG: Recent Labs  Lab 05/11/22 2121 05/12/22 0824 05/12/22 1213 05/12/22 1639  05/12/22 2119  GLUCAP 182* 147* 144* 158* 169*   Lipid Profile: No results for input(s): "CHOL", "HDL", "LDLCALC", "TRIG", "CHOLHDL", "LDLDIRECT" in the last 72 hours. Thyroid Function Tests: Recent Labs    05/12/22 0652  TSH 0.687   Anemia Panel: Recent Labs    05/12/22 1011 05/13/22 0439  VITAMINB12 271  --   FOLATE  --  6.0   Sepsis Labs: No results for input(s): "PROCALCITON", "LATICACIDVEN" in the last 168 hours.  No results found for this or any previous visit (from the past 240 hour(s)).       Radiology Studies: MR LUMBAR SPINE WO CONTRAST  Result Date: 05/11/2022 CLINICAL DATA:  Initial evaluation for lumbar radiculopathy,  progressively worsening right leg pain. History of end-stage renal disease on hemodialysis. EXAM: MRI LUMBAR SPINE WITHOUT CONTRAST TECHNIQUE: Multiplanar, multisequence MR imaging of the lumbar spine was performed. No intravenous contrast was administered. COMPARISON:  Prior MRI from 01/21/2008. FINDINGS: Segmentation: Standard. Lowest well-formed disc space labeled the L5-S1 level. Alignment: Trace 2 mm facet mediated anterolisthesis of L3 on L4 and L4 on L5, with trace retrolisthesis of L5 on S1. Vertebrae: Vertebral body height maintained without acute or chronic fracture. Bone marrow signal intensity within normal limits. No worrisome osseous lesions. No abnormal marrow edema. Conus medullaris and cauda equina: Conus extends to the L2 level. Conus and cauda equina appear normal. Paraspinal and other soft tissues: Mild edema within the lower posterior paraspinous soft tissues adjacent to the L4-5 and L5-S1 facets, likely reactive in nature due to adjacent facet arthritis. No collections. Atrophic multi-cystic kidneys, consistent with history of end-stage renal disease and dialysis. 1.8 cm simple cyst partially visualize within the left adnexa. This is almost certainly benign given size, with no follow-up imaging recommended. Note: This recommendation  does not apply to premenarchal patients and to those with increased risk (genetic, family history, elevated tumor markers or other high-risk factors) of ovarian cancer. Reference: JACR 2020 Feb; 17(2):248-254 Disc levels: T12-L1: Disc desiccation without significant disc bulge. Bilateral facet hypertrophy. No significant spinal stenosis. Foramina remain patent. L1-2: Disc desiccation without significant disc bulge. Mild facet hypertrophy. No canal or foraminal stenosis. L2-3: Disc desiccation with mild annular disc bulge. Mild to moderate facet hypertrophy. Rounded 6 mm nodular lesion at the anteromedial aspect of the left L2-3 facet favored to reflect a small complex synovial cyst. Resultant mild canal with moderate left lateral recess stenosis. Mild bilateral foraminal narrowing. L3-4: Mild disc bulge with disc desiccation. Moderate facet and ligament flavum hypertrophy with associated trace joint effusions. No significant spinal stenosis. Moderate bilateral L3 foraminal narrowing. L4-5: Trace anterolisthesis. Diffuse disc bulge with disc desiccation. Disc bulging slightly eccentric to the left. Moderate facet and ligament flavum hypertrophy with associated small joint effusions. Resultant mild canal with moderate bilateral subarticular stenosis. Resultant moderate left with mild to moderate right L4 foraminal narrowing. L5-S1: Trace retrolisthesis. Degenerative intervertebral disc space narrowing with diffuse disc bulge and disc desiccation. Associated reactive endplate spurring. Superimposed small central disc protrusion with annular fissure indents the ventral thecal sac. Mild facet hypertrophy. No significant spinal stenosis. Moderate bilateral L5 foraminal narrowing. IMPRESSION: 1. Multifactorial degenerative changes at L4-5 with resultant moderate bilateral subarticular stenosis, with moderate left greater than right L4 foraminal narrowing. 2. Moderate bilateral L3 and L5 foraminal stenosis related to disc  bulge, reactive endplate spurring, and facet degeneration. 3. 6 mm nodular lesion at the anteromedial aspect of the left L2-3 facet, favored to reflect a small complex synovial cyst. Resultant mild canal with moderate left lateral recess stenosis at this level. 4. Lower lumbar facet hypertrophy, most pronounced at L4-5. Finding could contribute to lower back pain. Electronically Signed   By: Jeannine Boga M.D.   On: 05/11/2022 21:04   CT TIBIA FIBULA RIGHT WO CONTRAST  Result Date: 05/11/2022 CLINICAL DATA:  Foot pain, persistent posttraumatic. Right leg pain with weakness and inability to ambulate. EXAM: CT OF THE LOWER RIGHT EXTREMITY WITHOUT CONTRAST TECHNIQUE: Multidetector CT imaging of the right lower leg was performed according to the standard protocol. RADIATION DOSE REDUCTION: This exam was performed according to the departmental dose-optimization program which includes automated exposure control, adjustment of the mA and/or kV according to patient size  and/or use of iterative reconstruction technique. COMPARISON:  Right hip and lower leg radiographs 05/09/2022. Doppler ultrasound 03/20/2022. FINDINGS: Bones/Joint/Cartilage Images extend from just above the right knee through the right ankle. The right foot is incompletely visualized. There is no evidence of acute fracture or dislocation. Moderate degenerative changes are present in the patellofemoral and medial compartments of the right knee. No significant knee joint effusion. No significant ankle joint effusion or arthropathy. Ligaments Suboptimally assessed by CT. Muscles and Tendons Mild generalized muscular atrophy. The patellar tendon is intact. The ankle tendons appear intact, although not all of their distal insertions are included. Soft tissues Prominent diffuse vascular calcifications. No focal fluid collection, foreign body or soft tissue emphysema. Minimal subcutaneous edema anteriorly in the distal lower leg. IMPRESSION: 1. No  acute osseous findings identified in the right lower leg. 2. Moderate degenerative changes in the patellofemoral and medial compartments of the right knee. 3. Diffuse vascular calcifications. 4. Mild diffuse muscular atrophy. Electronically Signed   By: Richardean Sale M.D.   On: 05/11/2022 15:19        Scheduled Meds:  apixaban  5 mg Oral BID   aspirin EC  81 mg Oral Daily   Chlorhexidine Gluconate Cloth  6 each Topical Q0600   vitamin D3  1,000 Units Oral Daily   cinacalcet  30 mg Oral Q breakfast   gabapentin  300 mg Oral BID   insulin aspart  0-5 Units Subcutaneous QHS   insulin aspart  0-9 Units Subcutaneous TID WC   levETIRAcetam  1,000 mg Oral Daily   levETIRAcetam  250 mg Oral Q M,W,F-1800   lidocaine  1 patch Transdermal Q24H   losartan  100 mg Oral QHS   methylPREDNISolone  20 mg Oral Daily   Followed by   Derrill Memo ON 05/14/2022] methylPREDNISolone  16 mg Oral Daily   Followed by   Derrill Memo ON 05/15/2022] methylPREDNISolone  12 mg Oral Daily   Followed by   Derrill Memo ON 05/16/2022] methylPREDNISolone  8 mg Oral Daily   Followed by   Derrill Memo ON 05/17/2022] methylPREDNISolone  4 mg Oral Daily   metoprolol succinate  100 mg Oral Daily   pantoprazole  40 mg Oral Daily   sertraline  25 mg Oral Daily   torsemide  100 mg Oral Daily   traZODone  25 mg Oral QHS   Continuous Infusions:  anticoagulant sodium citrate       LOS: 4 days   Time spent= 35 mins    Carlson Belland Arsenio Loader, MD [facility]  If 7PM-7AM, please contact night-coverage  05/13/2022, 7:58 AM

## 2022-05-13 NOTE — Progress Notes (Signed)
PT Cancellation Note  Patient Details Name: Sarahbeth Cashin MRN: [id number] DOB: 1951-04-13   Cancelled Treatment:    Reason Eval/Treat Not Completed: Patient at procedure or test/unavailable.  Pt currently at dialysis.  Will re-attempt PT session at a later date/time.  Leitha Bleak, PT 05/13/22, 9:38 AM

## 2022-05-13 NOTE — Progress Notes (Addendum)
Patient [facility]  ROUNDING NOTE   Subjective:   Cynthia Dean is a 71 y.o. 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 Patient presents to ED with complaints of right leg pain and weakness. She has been admitted for Right leg pain [M79.604] ESRD on dialysis ([place]) [N18.6, Z99.2] Generalized weakness [R53.1] Unable to ambulate [R26.2] Diarrhea, unspecified type [R19.7]  Patient is known to our practice and receives outpatient dialysis treatments at [facility] on a MWF schedule, supervised by Dr Candiss Norse.   Patient seen and evaluated during dialysis   HEMODIALYSIS FLOWSHEET:  Blood Flow Rate (mL/min): 400 mL/min Arterial Pressure (mmHg): -190 mmHg Venous Pressure (mmHg): 180 mmHg TMP (mmHg): 14 mmHg Ultrafiltration Rate (mL/min): 745 mL/min Dialysate Flow Rate (mL/min): 300 ml/min Dialysis Fluid Bolus: Normal Saline Bolus Amount (mL): 300 mL  No complaints at this time   Objective:  Vital signs in last 24 hours:  Temp:  [97.5 F (36.4 C)-98.8 F (37.1 C)] 97.5 F (36.4 C) (10/20 1233) Pulse Rate:  [59-66] 64 (10/20 1233) Resp:  [12-18] 18 (10/20 1233) BP: (124-167)/(56-82) 130/56 (10/20 1233) SpO2:  [90 %-100 %] 96 % (10/20 1233) Weight:  [106.6 kg-109.6 kg] 106.6 kg (10/20 1227)  Weight change: -2 kg Filed Weights   05/13/22 0500 05/13/22 0759 05/13/22 1227  Weight: 109.6 kg 108.4 kg 106.6 kg    Intake/Output: I/O last 3 completed shifts: In: 40 [P.O.:580] Out: -    Intake/Output this shift:  Total I/O In: -  Out: 2 [Other:2]  Physical Exam: General: NAD  Head: Normocephalic, atraumatic. Moist oral mucosal membranes  Eyes: Anicteric  Lungs:  Diminished in bases, normal effort , Meyer O2  Heart: Regular rate and rhythm  Abdomen:  Soft, nontender, obese  Extremities:  No peripheral edema.   Neurologic: Nonfocal, moving all four extremities  Skin: No lesions  Access: Lt upper AVG    Basic Metabolic Panel: Recent Labs  Lab 05/09/22 0054 05/10/22 0515 05/11/22 0940 05/12/22 0652 05/13/22 0439  NA 139 139 137 139 134*  K 4.0 3.9 4.6 4.2 5.4*  CL 106 102 100 99 94*  CO2 '24 25 25 28 28  '$ GLUCOSE 164* 149* 131* 153* 191*  BUN 42* 27* 50* 35* 55*  CREATININE 9.38* 6.53* 8.81* 6.45* 8.11*  CALCIUM 8.6* 8.6* 8.6* 8.4* 8.7*  MG  --   --   --  1.8 2.1  PHOS  --   --  6.5*  --   --      Liver Function Tests: Recent Labs  Lab 05/11/22 0940  ALBUMIN 2.7*    No results for input(s): "LIPASE", "AMYLASE" in the last 168 hours. No results for input(s): "AMMONIA" in the last 168 hours.  CBC: Recent Labs  Lab 05/09/22 0054 05/11/22 0940 05/12/22 0652 05/13/22 0439  WBC 10.2 8.7 9.0 8.8  NEUTROABS  --  6.0  --   --   HGB 8.4* 8.0* 8.0* 9.0*  HCT 27.4* 24.8* 25.3* 28.3*  MCV 89.8 85.8 86.6 86.8  PLT 254 221 208 253     Cardiac Enzymes: Recent Labs  Lab 05/09/22 0054  CKTOTAL 29*     BNP: Invalid input(s): "POCBNP"  CBG: Recent Labs  Lab 05/12/22 0824 05/12/22 1213 05/12/22 1639 05/12/22 2119 05/13/22 1244  GLUCAP 147* 144* 158* 169* 138*     Microbiology: Results for orders placed or performed during the hospital  encounter of 03/19/22  Resp Panel by RT-PCR (Flu A&B, Covid) Anterior Nasal Swab     Status: None   Collection Time: 03/19/22  9:28 PM   Specimen: Anterior Nasal Swab  Result Value Ref Range Status   SARS Coronavirus 2 by RT PCR NEGATIVE NEGATIVE Final    Comment: (NOTE) SARS-CoV-2 target nucleic acids are NOT DETECTED.  The SARS-CoV-2 RNA is generally detectable in upper respiratory specimens during the acute phase of infection. The lowest concentration of SARS-CoV-2 viral copies this assay can detect is 138 copies/mL. A negative result does not preclude SARS-Cov-2 infection and should not be used as the sole basis for  treatment or other patient management decisions. A negative result may occur with  improper specimen collection/handling, submission of specimen other than nasopharyngeal swab, presence of viral mutation(s) within the areas targeted by this assay, and inadequate number of viral copies(<138 copies/mL). A negative result must be combined with clinical observations, patient history, and epidemiological information. The expected result is Negative.  Fact Sheet for Patients:  EntrepreneurPulse.com.au  Fact Sheet for Healthcare Providers:  IncredibleEmployment.be  This test is no t yet approved or cleared by the [place] FDA and  has been authorized for detection and/or diagnosis of SARS-CoV-2 by FDA under an Emergency Use Authorization (EUA). This EUA will remain  in effect (meaning this test can be used) for the duration of the COVID-19 declaration under Section 564(b)(1) of the Act, 21 U.S.C.section 360bbb-3(b)(1), unless the authorization is terminated  or revoked sooner.       Influenza A by PCR NEGATIVE NEGATIVE Final   Influenza B by PCR NEGATIVE NEGATIVE Final    Comment: (NOTE) The Xpert Xpress SARS-CoV-2/FLU/RSV plus assay is intended as an aid in the diagnosis of influenza from Nasopharyngeal swab specimens and should not be used as a sole basis for treatment. Nasal washings and aspirates are unacceptable for Xpert Xpress SARS-CoV-2/FLU/RSV testing.  Fact Sheet for Patients: EntrepreneurPulse.com.au  Fact Sheet for Healthcare Providers: IncredibleEmployment.be  This test is not yet approved or cleared by the [place] FDA and has been authorized for detection and/or diagnosis of SARS-CoV-2 by FDA under an Emergency Use Authorization (EUA). This EUA will remain in effect (meaning this test can be used) for the duration of the COVID-19 declaration under Section 564(b)(1) of the Act, 21  U.S.C. section 360bbb-3(b)(1), unless the authorization is terminated or revoked.  Performed at [facility], [address] [place], [place] [address]   Blood Culture (routine x 2)     Status: Abnormal   Collection Time: 03/19/22  9:28 PM   Specimen: BLOOD  Result Value Ref Range Status   Specimen Description   Final    BLOOD RIGHT ANTECUBITAL Performed at [facility], [address] [place], [place] [address]    Special Requests   Final    BOTTLES DRAWN AEROBIC AND ANAEROBIC Blood Culture adequate volume Performed at [facility], [address], [place], [place] [address]    Culture  Setup Time   Final    GRAM POSITIVE COCCI IN BOTH AEROBIC AND ANAEROBIC BOTTLES CRITICAL RESULT CALLED TO, READ BACK BY AND VERIFIED WITH: CAROLYN CHILDS 03/20/22 1457 AMK Performed at [facility], [address] [place],  [address]    Culture STAPHYLOCOCCUS CAPITIS (A)  Final   Report Status 03/23/2022 FINAL  Final   Organism ID, Bacteria STAPHYLOCOCCUS CAPITIS  Final      Susceptibility   Staphylococcus capitis - MIC*  CIPROFLOXACIN <=0.5 SENSITIVE Sensitive     ERYTHROMYCIN <=0.25 SENSITIVE Sensitive     GENTAMICIN <=0.5 SENSITIVE Sensitive     OXACILLIN <=0.25 SENSITIVE Sensitive     TETRACYCLINE <=1 SENSITIVE Sensitive     VANCOMYCIN <=0.5 SENSITIVE Sensitive     TRIMETH/SULFA <=10 SENSITIVE Sensitive     CLINDAMYCIN <=0.25 SENSITIVE Sensitive     RIFAMPIN <=0.5 SENSITIVE Sensitive     Inducible Clindamycin NEGATIVE Sensitive     * STAPHYLOCOCCUS CAPITIS  Blood Culture ID Panel (Reflexed)     Status: Abnormal   Collection Time: 03/19/22  9:28 PM  Result Value Ref Range Status   Enterococcus faecalis NOT DETECTED NOT DETECTED Final   Enterococcus Faecium NOT DETECTED NOT DETECTED Final   Listeria monocytogenes NOT DETECTED NOT DETECTED Final   Staphylococcus species DETECTED (A) NOT DETECTED Final    Comment: CRITICAL RESULT  CALLED TO, READ BACK BY AND VERIFIED WITH: CAROLYN CHILDS 03/20/22 1457 AMK    Staphylococcus aureus (BCID) NOT DETECTED NOT DETECTED Final   Staphylococcus epidermidis NOT DETECTED NOT DETECTED Final   Staphylococcus lugdunensis NOT DETECTED NOT DETECTED Final   Streptococcus species NOT DETECTED NOT DETECTED Final   Streptococcus agalactiae NOT DETECTED NOT DETECTED Final   Streptococcus pneumoniae NOT DETECTED NOT DETECTED Final   Streptococcus pyogenes NOT DETECTED NOT DETECTED Final   A.calcoaceticus-baumannii NOT DETECTED NOT DETECTED Final   Bacteroides fragilis NOT DETECTED NOT DETECTED Final   Enterobacterales NOT DETECTED NOT DETECTED Final   Enterobacter cloacae complex NOT DETECTED NOT DETECTED Final   Escherichia coli NOT DETECTED NOT DETECTED Final   Klebsiella aerogenes NOT DETECTED NOT DETECTED Final   Klebsiella oxytoca NOT DETECTED NOT DETECTED Final   Klebsiella pneumoniae NOT DETECTED NOT DETECTED Final   Proteus species NOT DETECTED NOT DETECTED Final   Salmonella species NOT DETECTED NOT DETECTED Final   Serratia marcescens NOT DETECTED NOT DETECTED Final   Haemophilus influenzae NOT DETECTED NOT DETECTED Final   Neisseria meningitidis NOT DETECTED NOT DETECTED Final   Pseudomonas aeruginosa NOT DETECTED NOT DETECTED Final   Stenotrophomonas maltophilia NOT DETECTED NOT DETECTED Final   Candida albicans NOT DETECTED NOT DETECTED Final   Candida auris NOT DETECTED NOT DETECTED Final   Candida glabrata NOT DETECTED NOT DETECTED Final   Candida krusei NOT DETECTED NOT DETECTED Final   Candida parapsilosis NOT DETECTED NOT DETECTED Final   Candida tropicalis NOT DETECTED NOT DETECTED Final   Cryptococcus neoformans/gattii NOT DETECTED NOT DETECTED Final    Comment: Performed at [facility], [address] [place], [place] [address]  Blood Culture (routine x 2)     Status: Abnormal   Collection Time: 03/19/22 10:23 PM   Specimen: BLOOD  Result Value Ref  Range Status   Specimen Description   Final    BLOOD BLOOD RIGHT FOREARM Performed at [facility], [address], [place], [place] [address]    Special Requests   Final    BOTTLES DRAWN AEROBIC AND ANAEROBIC Blood Culture adequate volume Performed at [facility], [address] [place], [place] [address]    Culture  Setup Time   Final    GRAM POSITIVE COCCI IN BOTH AEROBIC AND ANAEROBIC BOTTLES CRITICAL RESULT CALLED TO, READ BACK BY AND VERIFIED WITH: ELVERA MADUEME 03/20/22 1545 AMK Performed at [facility], [address] [place], [place] [address]    Culture (A)  Final    STAPHYLOCOCCUS CAPITIS SUSCEPTIBILITIES PERFORMED ON PREVIOUS CULTURE WITHIN THE LAST  5 DAYS. Performed at [facility], [place] [address], [place], [place] [address]    Report Status 03/22/2022 FINAL  Final  Urine Culture     Status: Abnormal   Collection Time: 03/20/22  1:25 AM   Specimen: In/Out Cath Urine  Result Value Ref Range Status   Specimen Description   Final    IN/OUT CATH URINE Performed at [facility], [address] [place], [place] [address]    Special Requests   Final    NONE Performed at [facility], [address] [place], [place] [address]    Culture 4,000 COLONIES/mL STAPHYLOCOCCUS EPIDERMIDIS (A)  Final   Report Status 03/22/2022 FINAL  Final   Organism ID, Bacteria STAPHYLOCOCCUS EPIDERMIDIS (A)  Final      Susceptibility   Staphylococcus epidermidis - MIC*    CIPROFLOXACIN <=0.5 SENSITIVE Sensitive     GENTAMICIN <=0.5 SENSITIVE Sensitive     NITROFURANTOIN <=16 SENSITIVE Sensitive     OXACILLIN <=0.25 SENSITIVE Sensitive     TETRACYCLINE <=1 SENSITIVE Sensitive     VANCOMYCIN 2 SENSITIVE Sensitive     TRIMETH/SULFA 80 RESISTANT Resistant     CLINDAMYCIN >=8 RESISTANT Resistant     RIFAMPIN <=0.5 SENSITIVE Sensitive     Inducible Clindamycin NEGATIVE Sensitive     * 4,000 COLONIES/mL STAPHYLOCOCCUS EPIDERMIDIS   Culture, blood (Routine X 2) w Reflex to ID Panel     Status: None   Collection Time: 03/21/22 11:00 AM   Specimen: BLOOD  Result Value Ref Range Status   Specimen Description BLOOD BLOOD RIGHT FOREARM  Final   Special Requests   Final    BOTTLES DRAWN AEROBIC AND ANAEROBIC Blood Culture adequate volume   Culture   Final    NO GROWTH 5 DAYS Performed at [facility], [address] [place], [place] [address]    Report Status 03/26/2022 FINAL  Final  Culture, blood (Routine X 2) w Reflex to ID Panel     Status: None   Collection Time: 03/22/22 11:53 AM   Specimen: BLOOD RIGHT HAND  Result Value Ref Range Status   Specimen Description BLOOD RIGHT HAND  Final   Special Requests   Final    BOTTLES DRAWN AEROBIC AND ANAEROBIC Blood Culture adequate volume   Culture   Final    NO GROWTH 5 DAYS Performed at [facility], [address] [place], [place] [address]    Report Status 03/27/2022 FINAL  Final  Aerobic/Anaerobic Culture w Gram Stain (surgical/deep wound)     Status: None   Collection Time: 03/25/22 10:30 AM   Specimen: Wound; Blood  Result Value Ref Range Status   Specimen Description WOUND LEFT BREAST  Final   Special Requests BLOOD HEMATOMA  Final   Gram Stain   Final    RARE WBC PRESENT, PREDOMINANTLY MONONUCLEAR NO ORGANISMS SEEN    Culture   Final    No growth aerobically or anaerobically. Performed at [facility], [place] [address], [place], [place] [address]    Report Status 03/30/2022 FINAL  Final    Coagulation Studies: No results for input(s): "LABPROT", "INR" in the last 72 hours.  Urinalysis: No results for input(s): "COLORURINE", "LABSPEC", "PHURINE", "GLUCOSEU", "HGBUR", "BILIRUBINUR", "KETONESUR", "PROTEINUR", "UROBILINOGEN", "NITRITE", "LEUKOCYTESUR" in the last 72 hours.  Invalid input(s): "APPERANCEUR"    Imaging: MR LUMBAR SPINE WO CONTRAST  Result Date: 05/11/2022 CLINICAL DATA:  Initial evaluation for lumbar  radiculopathy, progressively worsening right leg pain.  History of end-stage renal disease on hemodialysis. EXAM: MRI LUMBAR SPINE WITHOUT CONTRAST TECHNIQUE: Multiplanar, multisequence MR imaging of the lumbar spine was performed. No intravenous contrast was administered. COMPARISON:  Prior MRI from 01/21/2008. FINDINGS: Segmentation: Standard. Lowest well-formed disc space labeled the L5-S1 level. Alignment: Trace 2 mm facet mediated anterolisthesis of L3 on L4 and L4 on L5, with trace retrolisthesis of L5 on S1. Vertebrae: Vertebral body height maintained without acute or chronic fracture. Bone marrow signal intensity within normal limits. No worrisome osseous lesions. No abnormal marrow edema. Conus medullaris and cauda equina: Conus extends to the L2 level. Conus and cauda equina appear normal. Paraspinal and other soft tissues: Mild edema within the lower posterior paraspinous soft tissues adjacent to the L4-5 and L5-S1 facets, likely reactive in nature due to adjacent facet arthritis. No collections. Atrophic multi-cystic kidneys, consistent with history of end-stage renal disease and dialysis. 1.8 cm simple cyst partially visualize within the left adnexa. This is almost certainly benign given size, with no follow-up imaging recommended. Note: This recommendation does not apply to premenarchal patients and to those with increased risk (genetic, family history, elevated tumor markers or other high-risk factors) of ovarian cancer. Reference: JACR 2020 Feb; 17(2):248-254 Disc levels: T12-L1: Disc desiccation without significant disc bulge. Bilateral facet hypertrophy. No significant spinal stenosis. Foramina remain patent. L1-2: Disc desiccation without significant disc bulge. Mild facet hypertrophy. No canal or foraminal stenosis. L2-3: Disc desiccation with mild annular disc bulge. Mild to moderate facet hypertrophy. Rounded 6 mm nodular lesion at the anteromedial aspect of the left L2-3 facet favored to  reflect a small complex synovial cyst. Resultant mild canal with moderate left lateral recess stenosis. Mild bilateral foraminal narrowing. L3-4: Mild disc bulge with disc desiccation. Moderate facet and ligament flavum hypertrophy with associated trace joint effusions. No significant spinal stenosis. Moderate bilateral L3 foraminal narrowing. L4-5: Trace anterolisthesis. Diffuse disc bulge with disc desiccation. Disc bulging slightly eccentric to the left. Moderate facet and ligament flavum hypertrophy with associated small joint effusions. Resultant mild canal with moderate bilateral subarticular stenosis. Resultant moderate left with mild to moderate right L4 foraminal narrowing. L5-S1: Trace retrolisthesis. Degenerative intervertebral disc space narrowing with diffuse disc bulge and disc desiccation. Associated reactive endplate spurring. Superimposed small central disc protrusion with annular fissure indents the ventral thecal sac. Mild facet hypertrophy. No significant spinal stenosis. Moderate bilateral L5 foraminal narrowing. IMPRESSION: 1. Multifactorial degenerative changes at L4-5 with resultant moderate bilateral subarticular stenosis, with moderate left greater than right L4 foraminal narrowing. 2. Moderate bilateral L3 and L5 foraminal stenosis related to disc bulge, reactive endplate spurring, and facet degeneration. 3. 6 mm nodular lesion at the anteromedial aspect of the left L2-3 facet, favored to reflect a small complex synovial cyst. Resultant mild canal with moderate left lateral recess stenosis at this level. 4. Lower lumbar facet hypertrophy, most pronounced at L4-5. Finding could contribute to lower back pain. Electronically Signed   By: Jeannine Boga M.D.   On: 05/11/2022 21:04   CT TIBIA FIBULA RIGHT WO CONTRAST  Result Date: 05/11/2022 CLINICAL DATA:  Foot pain, persistent posttraumatic. Right leg pain with weakness and inability to ambulate. EXAM: CT OF THE LOWER RIGHT  EXTREMITY WITHOUT CONTRAST TECHNIQUE: Multidetector CT imaging of the right lower leg was performed according to the standard protocol. RADIATION DOSE REDUCTION: This exam was performed according to the departmental dose-optimization program which includes automated exposure control, adjustment of the mA and/or kV according to patient size and/or use of iterative reconstruction  technique. COMPARISON:  Right hip and lower leg radiographs 05/09/2022. Doppler ultrasound 03/20/2022. FINDINGS: Bones/Joint/Cartilage Images extend from just above the right knee through the right ankle. The right foot is incompletely visualized. There is no evidence of acute fracture or dislocation. Moderate degenerative changes are present in the patellofemoral and medial compartments of the right knee. No significant knee joint effusion. No significant ankle joint effusion or arthropathy. Ligaments Suboptimally assessed by CT. Muscles and Tendons Mild generalized muscular atrophy. The patellar tendon is intact. The ankle tendons appear intact, although not all of their distal insertions are included. Soft tissues Prominent diffuse vascular calcifications. No focal fluid collection, foreign body or soft tissue emphysema. Minimal subcutaneous edema anteriorly in the distal lower leg. IMPRESSION: 1. No acute osseous findings identified in the right lower leg. 2. Moderate degenerative changes in the patellofemoral and medial compartments of the right knee. 3. Diffuse vascular calcifications. 4. Mild diffuse muscular atrophy. Electronically Signed   By: Richardean Sale M.D.   On: 05/11/2022 15:19     Medications:    anticoagulant sodium citrate      apixaban  5 mg Oral BID   aspirin EC  81 mg Oral Daily   Chlorhexidine Gluconate Cloth  6 each Topical Q0600   vitamin D3  1,000 Units Oral Daily   cinacalcet  30 mg Oral Q breakfast   gabapentin  300 mg Oral BID   insulin aspart  0-5 Units Subcutaneous QHS   insulin aspart  0-9 Units  Subcutaneous TID WC   levETIRAcetam  1,000 mg Oral Daily   levETIRAcetam  250 mg Oral Q M,W,F-1800   lidocaine  1 patch Transdermal Q24H   losartan  100 mg Oral QHS   methylPREDNISolone  20 mg Oral Daily   Followed by   Derrill Memo ON 05/14/2022] methylPREDNISolone  16 mg Oral Daily   Followed by   Derrill Memo ON 05/15/2022] methylPREDNISolone  12 mg Oral Daily   Followed by   Derrill Memo ON 05/16/2022] methylPREDNISolone  8 mg Oral Daily   Followed by   Derrill Memo ON 05/17/2022] methylPREDNISolone  4 mg Oral Daily   metoprolol succinate  100 mg Oral Daily   pantoprazole  40 mg Oral Daily   sertraline  25 mg Oral Daily   torsemide  100 mg Oral Daily   traZODone  25 mg Oral QHS   acetaminophen, alteplase, anticoagulant sodium citrate, diphenoxylate-atropine, fentaNYL (SUBLIMAZE) injection, fluticasone, guaiFENesin, heparin, hydrALAZINE, HYDROmorphone, ipratropium-albuterol, lidocaine (PF), lidocaine-prilocaine, LORazepam, metoprolol tartrate, ondansetron (ZOFRAN) IV, pentafluoroprop-tetrafluoroeth, senna-docusate, traZODone  Assessment/ Plan:  [name] is a 71 y.o.  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 Patient presents to ED with complaints of right leg pain and weakness. She has been admitted for Right leg pain [M79.604] ESRD on dialysis ([place]) [N18.6, Z99.2] Generalized weakness [R53.1] Unable to ambulate [R26.2] Diarrhea, unspecified type [R19.7]  [facility]/MWF/Lt AVG  End stage renal disease on hemodialysis.        Patient is a Monday Wednesday Friday schedule       Receiving dialysis today, UF goal 1.5-2L as tolerated. Next treatment scheduled for Monday.   2. Anemia of chronic kidney disease, Normocytic Lab Results  Component Value Date   HGB 9.0 (L) 05/13/2022    Hgb just within target.  Patient receives [place] outpatient.    3. Secondary Hyperparathyroidism: with outpatient labs: PTH 613, phosphorus 4.8, calcium 9.0  on 03/07/22.    Lab Results  Component Value Date   PTH 131 (H) 03/14/2018   CALCIUM 8.7 (L) 05/13/2022   PHOS 6.5 (H) 05/11/2022    Phosphorus elevated, will monitor and assess need for binders.   4. Hypertension with chronic kidney disease. Home regimen includes losartan, metoprolol, and torsemide. Currently receiving these medications.   5. Diabetes mellitus type II with chronic kidney disease: insulin dependent.. Well controlled.  6.Inability to ambulate secondary to right leg pain. Primary team , ortho and Neurosurgery is following. OT recommending SNF for short term rehab.    LOS: 4 Shantelle Breeze 10/20/202312:48 PM  I saw and evaluated the patient with Colon Flattery, NP.  I personally formulated the plan of care.  I agree with the findings and plan as documented in the note except as noted

## 2022-05-13 NOTE — Progress Notes (Signed)
Physical Therapy Treatment Patient Details Name: Cynthia Dean MRN: [id number] DOB: 23-Mar-1951 Today's Date: 05/13/2022   History of Present Illness Pt is a 71 y.o. female presenting to hospital 10/15 with c/o R leg pain; unable to walk secondary to pain; has had R LE pain for more than 3 months but now worsened.  PMH includes ESRD on HD MWF, acute on chronic respiratory failure with hypoxia, COPD, CAD, cryptogenic stroke, DM, diarrhea, GI bleed, heart murmur, PNA d/t COVID-19 virus, seizure, PE.    PT Comments    Pt resting in bed upon PT arrival; agreeable to PT session.  Pain 9/10 R hip beginning/end of session at rest (pt received pain medication prior to PT session).  During session pt 1-2 assist with bed mobility; max assist x1 to attempt standing (x6 trials) from elevated bed height (pt able to come 1/3 to 1/2 stand and had significant difficulty standing upright--pt leaning forward on walker); and mod to max assist lateral scooting to L along bed x4 trials.  Will continue to focus on strengthening and progressive functional mobility during hospitalization.    Recommendations for follow up therapy are one component of a multi-disciplinary discharge planning process, led by the attending physician.  Recommendations may be updated based on patient status, additional functional criteria and insurance authorization.  Follow Up Recommendations  Skilled nursing-short term rehab (<3 hours/day) Can patient physically be transported by private vehicle: No   Assistance Recommended at Discharge Frequent or constant Supervision/Assistance  Patient can return home with the following Two people to help with walking and/or transfers;Two people to help with bathing/dressing/bathroom;Assistance with cooking/housework;Assist for transportation;Help with stairs or ramp for entrance   Equipment Recommendations  Rolling walker (2 wheels);BSC/3in1;Wheelchair (measurements PT);Wheelchair cushion  (measurements PT);Hospital bed (hoyer lift)    Recommendations for Other Services OT consult     Precautions / Restrictions Precautions Precautions: Fall Precaution Comments: Seizure; L UE fistula Restrictions Weight Bearing Restrictions: Yes RLE Weight Bearing: Weight bearing as tolerated     Mobility  Bed Mobility Overal bed mobility: Needs Assistance Bed Mobility: Supine to Sit, Sit to Supine     Supine to sit: Mod assist, Max assist, HOB elevated Sit to supine: +2 for physical assistance   General bed mobility comments: assist for trunk and B LE's; vc's for technique    Transfers Overall transfer level: Needs assistance Equipment used: Rolling walker (2 wheels) Transfers: Sit to/from Stand Sit to Stand: From elevated surface, Max assist          Lateral/Scoot Transfers: Mod assist, Max assist (lateral scooting to L (sitting on bed) x4 trials with vc's for technique) General transfer comment: x6 trials standing; 1st 2 trials pt about to come to 1/3rd stand; pt able to come to 1/2 stand 3rd-5th trial; and 1/3 stand noted 6th trial; pt in flexed trunk posture each time leaning forward onto walker; vc's for overall technique    Ambulation/Gait               General Gait Details: pt unable to take any steps with 1 assist and walker use   Stairs             Wheelchair Mobility    Modified Rankin (Stroke Patients Only)       Balance Overall balance assessment: Needs assistance Sitting-balance support: Feet supported, No upper extremity supported Sitting balance-Leahy Scale: Good Sitting balance - Comments: steady sitting reaching within BOS   Standing balance support: Bilateral upper extremity supported, Reliant on  assistive device for balance   Standing balance comment: pt unable to come to upright standing with 1 assist and RW use                            Cognition Arousal/Alertness: Awake/alert Behavior During Therapy: WFL for  tasks assessed/performed (except increased time to respond at times) Overall Cognitive Status: Within Functional Limits for tasks assessed                                          Exercises      General Comments  Nursing cleared pt for participation in physical therapy.  Pt agreeable to PT session.  Pt's boyfriend present during session (appearing to intermittently fall asleep in recliner).      Pertinent Vitals/Pain Pain Assessment Pain Assessment: 0-10 Pain Score: 9  Pain Location: R hip Pain Descriptors / Indicators: Tender, Sore, Discomfort, Grimacing Pain Intervention(s): Limited activity within patient's tolerance, Monitored during session, Premedicated before session, Repositioned Vitals (HR and O2 on 2 L via nasal cannula) stable and WFL throughout treatment session.    Home Living                          Prior Function            PT Goals (current goals can now be found in the care plan section) Acute Rehab PT Goals Patient Stated Goal: to improve pain and mobility PT Goal Formulation: With patient Time For Goal Achievement: 05/24/22 Potential to Achieve Goals: Good Progress towards PT goals: Progressing toward goals    Frequency    Min 2X/week      PT Plan Current plan remains appropriate    Co-evaluation              AM-PAC PT "6 Clicks" Mobility   Outcome Measure  Help needed turning from your back to your side while in a flat bed without using bedrails?: A Little Help needed moving from lying on your back to sitting on the side of a flat bed without using bedrails?: A Lot Help needed moving to and from a bed to a chair (including a wheelchair)?: Total Help needed standing up from a chair using your arms (e.g., wheelchair or bedside chair)?: Total Help needed to walk in hospital room?: Total Help needed climbing 3-5 steps with a railing? : Total 6 Click Score: 9    End of Session Equipment Utilized During  Treatment: Gait belt Activity Tolerance: Patient limited by fatigue Patient left: in bed;with call bell/phone within reach;with bed alarm set;with family/visitor present;Other (comment) (B heels floating via pillow support) Nurse Communication: Mobility status;Precautions;Other (comment) (pt requesting medication for constipation; pain 9/10 R hip) PT Visit Diagnosis: Other abnormalities of gait and mobility (R26.89);Muscle weakness (generalized) (M62.81);Pain Pain - Right/Left: Right Pain - part of body: Hip     Time: [id number] PT Time Calculation (min) (ACUTE ONLY): 24 min  Charges:  $Therapeutic Activity: 23-37 mins                     Leitha Bleak, PT 05/13/22, 3:11 PM

## 2022-05-14 DIAGNOSIS — R262 Difficulty in walking, not elsewhere classified: Secondary | ICD-10-CM | POA: Diagnosis not present

## 2022-05-14 LAB — BASIC METABOLIC PANEL
Anion gap: 12 (ref 5–15)
BUN: 43 mg/dL — ABNORMAL HIGH (ref 8–23)
CO2: 28 mmol/L (ref 22–32)
Calcium: 9.1 mg/dL (ref 8.9–10.3)
Chloride: 96 mmol/L — ABNORMAL LOW (ref 98–111)
Creatinine, Ser: 6.05 mg/dL — ABNORMAL HIGH (ref 0.44–1.00)
GFR, Estimated: 7 mL/min — ABNORMAL LOW (ref >60)
Glucose, Bld: 210 mg/dL — ABNORMAL HIGH (ref 70–99)
Potassium: 4.8 mmol/L (ref 3.5–5.1)
Sodium: 136 mmol/L (ref 135–145)

## 2022-05-14 LAB — GLUCOSE, CAPILLARY
Glucose-Capillary: 171 mg/dL — ABNORMAL HIGH (ref 70–99)
Glucose-Capillary: 165 mg/dL — ABNORMAL HIGH (ref 70–99)
Glucose-Capillary: 163 mg/dL — ABNORMAL HIGH (ref 70–99)
Glucose-Capillary: 229 mg/dL — ABNORMAL HIGH (ref 70–99)
Glucose-Capillary: 212 mg/dL — ABNORMAL HIGH (ref 70–99)

## 2022-05-14 LAB — CBC
HCT: 26.6 % — ABNORMAL LOW (ref 36.0–46.0)
Hemoglobin: 8.4 g/dL — ABNORMAL LOW (ref 12.0–15.0)
MCH: 27.3 pg (ref 26.0–34.0)
MCHC: 31.6 g/dL (ref 30.0–36.0)
MCV: 86.4 fL (ref 80.0–100.0)
Platelets: 276 10*3/uL (ref 150–400)
RBC: 3.08 MIL/uL — ABNORMAL LOW (ref 3.87–5.11)
RDW: 14.6 % (ref 11.5–15.5)
WBC: 10.1 10*3/uL (ref 4.0–10.5)
nRBC: 0 % (ref 0.0–0.2)

## 2022-05-14 LAB — MAGNESIUM: Magnesium: 1.9 mg/dL (ref 1.7–2.4)

## 2022-05-14 NOTE — Progress Notes (Signed)
PROGRESS NOTE    Cynthia Dean   OVZ:[id number] DOB: 1950-09-24  DOA: 05/09/2022 Date of Service: 05/14/22 PCP: Lowella Bandy, MD     Brief Narrative / Hospital Course:  71 year old with history of ESRD on HD MWF, HTN, HLD, DM 2, CVA, depression, CAD, PE on Eliquis, seizure, diastolic CHF, anemia, obesity admitted for right leg pain, weakness and unable to ambulate.  Apparently some of the symptoms have been going on for about 3 months.  Negative right lower extremity Doppler on 10/13.  PT/OT recommending rehab.  Eventually due to right lower extremity pain and back discomfort CT of the tib-fib and MRI lumbar spine was done which showed chronic changes and radiculopathy/neuropathy.  Neurosurgery and orthopedic recommended trial of steroids, therapy and outpatient follow-up with physiatry.    Consultants:  Neurosurgery Orthopedic surgery  Procedures: none      ASSESSMENT & PLAN:   Principal Problem:   Unable to ambulate Active Problems:   Right leg pain   ESRD on dialysis ([place])   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-stage renal disease) (HCC)   CAD (coronary artery disease)   Chronic diastolic CHF (congestive heart failure) (HCC)   Essential hypertension   COPD (chronic obstructive pulmonary disease) (HCC)   Depression   Dyslipidemia   Pulmonary emboli (HCC)   Seizure (HCC)   Stroke ([place])   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[place])   Obesity with body mass index (BMI) of 30.0 to 39.9   Diarrhea   Generalized weakness   Unable to ambulate; Right LE Pain Chronic lumbar radiculopathy along with peroneal/peripheral neuropathy Secondary to severe pain in the right leg.  X-ray negative, lower extremity Dopplers is also negative for DVT.  Does have good pulses.  PT/OT recommending SNF.   Currently Lipitor is on hold.  Neurontin 20 mg twice daily started. CK - 29 CT and lumbar spine reviewed showing chronic changes.  Not a good surgical candidate.   Seen by neuro and orthopedic-recommending now trial of for steroids, PT/OT and outpatient follow-up with physiatry    ESRD on dialysis [facility]) MWF Nephro following for HD   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-stage renal disease) ([place]) Hemoglobin stable 8.4 (7.8 on 04/11/2022).   Sensipar Follow-up with CBC   CAD (coronary artery disease) No chest pain, on aspirin and Eliquis.   Statin on hold   Chronic diastolic CHF (congestive heart failure) ([place]) Patient does not have leg edema.  CHF is compensated.  2D echo 03/22/2022 showed EF of 50 to 55% with grade 2 diastolic dysfunction.   Volume status will be managed with HD.   On daily torsemide   Essential hypertension Cozaar, Toprol-XL.  Torsemide   COPD (chronic obstructive pulmonary disease) (HCC) Stable Bronchodilators   Depression Continue home medications   Dyslipidemia Hold Lipitor as above   Pulmonary emboli (HCC) Continue Eliquis   Seizure (HCC) Keppra    Stroke (HCC) On aspirin, Eliquis  Holding statin   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) Recent A1c 5.  8, well controlled.  Patient's not taking 70/30 insulin currently. Sliding scale insulin   Obesity with body mass index (BMI) of 30.0 to 39.9 BMI= 37.02,   and BW= 113.7Kg Diet and exercise.   Encourage to lose weight.   Diarrhea Resolved        DVT prophylaxis: Eliquis Pertinent IV fluids/nutrition: no continuous IV fluids Central lines / invasive devices: none  Code Status: Full code Family Communication: spoke w/  daughter Maudie Mercury on the phone at bedside on rounds  Disposition: Inpatient TOC needs: SNF Barriers to discharge / significant pending items: Awaiting SNF placement             Subjective:  Patient reports doing well today other than persistent hip and leg pain - see above re: workup done. No changes. No other complaints.        Objective:  Vitals:   05/13/22 1900 05/14/22 0500 05/14/22 0503 05/14/22  0802  BP: (!) 124/52  (!) 150/69 (!) 156/73  Pulse: 65  60 61  Resp: '18  18 18  '$ Temp: 98.6 F (37 C)  97.9 F (36.6 C) (!) 97.5 F (36.4 C)  TempSrc:   Oral Oral  SpO2: 92%  100% 100%  Weight:  109 kg    Height:        Intake/Output Summary (Last 24 hours) at 05/14/2022 1320 Last data filed at 05/14/2022 1145 Gross per 24 hour  Intake 460 ml  Output --  Net 460 ml   Filed Weights   05/13/22 0759 05/13/22 1227 05/14/22 0500  Weight: 108.4 kg 106.6 kg 109 kg    Examination:  Constitutional:  VS as above General Appearance: alert, well-nourished, NAD Respiratory: Normal respiratory effort No wheeze No rhonchi No rales Cardiovascular: S1/S2 normal No rub/gallop auscultated No lower extremity edema Gastrointestinal: No tenderness Musculoskeletal:  No clubbing/cyanosis of digits Symmetrical movement in all extremities Neurological: No cranial nerve deficit on limited exam Alert Psychiatric: Fair judgment/insight Normal mood and affect       Scheduled Medications:   apixaban  5 mg Oral BID   aspirin EC  81 mg Oral Daily   Chlorhexidine Gluconate Cloth  6 each Topical Q0600   vitamin D3  1,000 Units Oral Daily   cinacalcet  30 mg Oral Q breakfast   gabapentin  300 mg Oral BID   insulin aspart  0-5 Units Subcutaneous QHS   insulin aspart  0-9 Units Subcutaneous TID WC   levETIRAcetam  1,000 mg Oral Daily   levETIRAcetam  250 mg Oral Q M,W,F-1800   lidocaine  1 patch Transdermal Q24H   losartan  100 mg Oral QHS   [START ON 05/15/2022] methylPREDNISolone  12 mg Oral Daily   Followed by   Derrill Memo ON 05/16/2022] methylPREDNISolone  8 mg Oral Daily   Followed by   Derrill Memo ON 05/17/2022] methylPREDNISolone  4 mg Oral Daily   metoprolol succinate  100 mg Oral Daily   pantoprazole  40 mg Oral Daily   sertraline  25 mg Oral Daily   torsemide  100 mg Oral Daily   traZODone  25 mg Oral QHS    Continuous Infusions:  anticoagulant sodium citrate      PRN  Medications:  acetaminophen, alteplase, anticoagulant sodium citrate, diphenoxylate-atropine, fentaNYL (SUBLIMAZE) injection, fluticasone, guaiFENesin, heparin, hydrALAZINE, HYDROmorphone, ipratropium-albuterol, lidocaine (PF), lidocaine-prilocaine, LORazepam, metoprolol tartrate, ondansetron (ZOFRAN) IV, pentafluoroprop-tetrafluoroeth, senna-docusate, traZODone  Antimicrobials:  Anti-infectives (From admission, onward)    None       Data Reviewed: I have personally reviewed following labs and imaging studies  CBC: Recent Labs  Lab 05/09/22 0054 05/11/22 0940 05/12/22 0652 05/13/22 0439 05/14/22 0458  WBC 10.2 8.7 9.0 8.8 10.1  NEUTROABS  --  6.0  --   --   --   HGB 8.4* 8.0* 8.0* 9.0* 8.4*  HCT 27.4* 24.8* 25.3* 28.3* 26.6*  MCV 89.8 85.8 86.6 86.8 86.4  PLT 254 221 208 253 629   Basic Metabolic  Panel: Recent Labs  Lab 05/10/22 0515 05/11/22 0940 05/12/22 0652 05/13/22 0439 05/14/22 0458  NA 139 137 139 134* 136  K 3.9 4.6 4.2 5.4* 4.8  CL 102 100 99 94* 96*  CO2 '25 25 28 28 28  '$ GLUCOSE 149* 131* 153* 191* 210*  BUN 27* 50* 35* 55* 43*  CREATININE 6.53* 8.81* 6.45* 8.11* 6.05*  CALCIUM 8.6* 8.6* 8.4* 8.7* 9.1  MG  --   --  1.8 2.1 1.9  PHOS  --  6.5*  --   --   --    GFR: Estimated Creatinine Clearance: 11.2 mL/min (A) (by C-G formula based on SCr of 6.05 mg/dL (H)). Liver Function Tests: Recent Labs  Lab 05/11/22 0940  ALBUMIN 2.7*   No results for input(s): "LIPASE", "AMYLASE" in the last 168 hours. No results for input(s): "AMMONIA" in the last 168 hours. Coagulation Profile: No results for input(s): "INR", "PROTIME" in the last 168 hours. Cardiac Enzymes: Recent Labs  Lab 05/09/22 0054  CKTOTAL 29*   BNP (last 3 results) No results for input(s): "PROBNP" in the last 8760 hours. HbA1C: No results for input(s): "HGBA1C" in the last 72 hours. CBG: Recent Labs  Lab 05/13/22 1655 05/13/22 2104 05/14/22 0806 05/14/22 0905 05/14/22 1158   GLUCAP 228* 264* 171* 165* 163*   Lipid Profile: No results for input(s): "CHOL", "HDL", "LDLCALC", "TRIG", "CHOLHDL", "LDLDIRECT" in the last 72 hours. Thyroid Function Tests: Recent Labs    05/12/22 0652  TSH 0.687   Anemia Panel: Recent Labs    05/12/22 1011 05/13/22 0439  VITAMINB12 271  --   FOLATE  --  6.0   Urine analysis:    Component Value Date/Time   COLORURINE YELLOW (A) 03/20/2022 0125   APPEARANCEUR CLEAR (A) 03/20/2022 0125   APPEARANCEUR Clear 06/14/2014 1510   LABSPEC 1.010 03/20/2022 0125   LABSPEC 1.006 06/14/2014 1510   PHURINE 7.0 03/20/2022 0125   GLUCOSEU 50 (A) 03/20/2022 0125   GLUCOSEU >=500 06/14/2014 1510   HGBUR MODERATE (A) 03/20/2022 0125   BILIRUBINUR NEGATIVE 03/20/2022 0125   BILIRUBINUR Negative 06/14/2014 1510   KETONESUR NEGATIVE 03/20/2022 0125   PROTEINUR 100 (A) 03/20/2022 0125   NITRITE NEGATIVE 03/20/2022 0125   LEUKOCYTESUR NEGATIVE 03/20/2022 0125   LEUKOCYTESUR Negative 06/14/2014 1510   Sepsis Labs: '@LABRCNTIP'$ (procalcitonin:4,lacticidven:4)  No results found for this or any previous visit (from the past 240 hour(s)).       Radiology Studies: DG Tibia/Fibula Right  Result Date: 05/09/2022 CLINICAL DATA:  Right lower leg pain. EXAM: RIGHT TIBIA AND FIBULA - 2 VIEW COMPARISON:  None Available. FINDINGS: No fracture, dislocation, bony lesion or bony destruction identified. Extensive popliteal and tibial arterial vascular calcifications noted. IMPRESSION: No acute findings in the right lower leg. Extensive arterial vascular calcifications. Electronically Signed   By: Aletta Edouard M.D.   On: 05/09/2022 16:35   DG Chest 1 View  Result Date: 05/09/2022 CLINICAL DATA:  Weakness EXAM: CHEST  1 VIEW COMPARISON:  03/19/2022 FINDINGS: Right Port-A-Cath remains in place, unchanged. Cardiomegaly, aortic atherosclerosis. Vascular congestion. No confluent opacities, effusions or overt edema. No acute bony abnormality.  IMPRESSION: Cardiomegaly, vascular congestion Electronically Signed   By: Rolm Baptise M.D.   On: 05/09/2022 03:35   DG Hip Unilat With Pelvis 2-3 Views Right  Result Date: 05/09/2022 CLINICAL DATA:  Pain for 3 months EXAM: DG HIP (WITH OR WITHOUT PELVIS) 2-3V RIGHT COMPARISON:  None Available. FINDINGS: Hip joints and SI joints symmetric. No acute bony  abnormality. Specifically, no fracture, subluxation, or dislocation. IMPRESSION: No acute bony abnormality. Electronically Signed   By: Rolm Baptise M.D.   On: 05/09/2022 03:34            LOS: 5 days       Emeterio Reeve, DO [facility] 05/14/2022, 1:20 PM   Staff may message me via secure chat in [place]  but this may not receive immediate response,  please page for urgent matters!  If 7PM-7AM, please contact night-coverage [email or web address]  Dictation software was used to generate the above note. Typos may occur and escape review, as with typed/written notes. Please contact Dr Sheppard Coil directly for clarity if needed.

## 2022-05-14 NOTE — Progress Notes (Signed)
[facility]  PROGRESS NOTE   Subjective:   Patient seen at bedside.  Comfortable.  Objective:  Vital signs: Blood pressure (!) 156/73, pulse 61, temperature (!) 97.5 F (36.4 C), temperature source Oral, resp. rate 18, height '5\' 9"'$  (1.753 m), weight 109 kg, SpO2 100 %.  Intake/Output Summary (Last 24 hours) at 05/14/2022 1640 Last data filed at 05/14/2022 1250 Gross per 24 hour  Intake 700 ml  Output --  Net 700 ml   Filed Weights   05/13/22 0759 05/13/22 1227 05/14/22 0500  Weight: 108.4 kg 106.6 kg 109 kg     Physical Exam: General:  No acute distress  Head:  Normocephalic, atraumatic. Moist oral mucosal membranes  Eyes:  Anicteric  Neck:  Supple  Lungs:   Clear to auscultation, normal effort  Heart:  S1S2 no rubs  Abdomen:   Soft, nontender, bowel sounds present  Extremities:  peripheral edema.  Neurologic:  Awake, alert, following commands  Skin:  No lesions  Access:     Basic Metabolic Panel: Recent Labs  Lab 05/10/22 0515 05/11/22 0940 05/12/22 0652 05/13/22 0439 05/14/22 0458  NA 139 137 139 134* 136  K 3.9 4.6 4.2 5.4* 4.8  CL 102 100 99 94* 96*  CO2 '25 25 28 28 28  '$ GLUCOSE 149* 131* 153* 191* 210*  BUN 27* 50* 35* 55* 43*  CREATININE 6.53* 8.81* 6.45* 8.11* 6.05*  CALCIUM 8.6* 8.6* 8.4* 8.7* 9.1  MG  --   --  1.8 2.1 1.9  PHOS  --  6.5*  --   --   --     CBC: Recent Labs  Lab 05/09/22 0054 05/11/22 0940 05/12/22 0652 05/13/22 0439 05/14/22 0458  WBC 10.2 8.7 9.0 8.8 10.1  NEUTROABS  --  6.0  --   --   --   HGB 8.4* 8.0* 8.0* 9.0* 8.4*  HCT 27.4* 24.8* 25.3* 28.3* 26.6*  MCV 89.8 85.8 86.6 86.8 86.4  PLT 254 221 208 253 276     Urinalysis: No results for input(s): "COLORURINE", "LABSPEC", "PHURINE", "GLUCOSEU", "HGBUR", "BILIRUBINUR", "KETONESUR", "PROTEINUR", "UROBILINOGEN", "NITRITE", "LEUKOCYTESUR" in the last 72 hours.  Invalid input(s): "APPERANCEUR"    Imaging: No results found.   Medications:     anticoagulant sodium citrate      apixaban  5 mg Oral BID   aspirin EC  81 mg Oral Daily   Chlorhexidine Gluconate Cloth  6 each Topical Q0600   vitamin D3  1,000 Units Oral Daily   cinacalcet  30 mg Oral Q breakfast   gabapentin  300 mg Oral BID   insulin aspart  0-5 Units Subcutaneous QHS   insulin aspart  0-9 Units Subcutaneous TID WC   levETIRAcetam  1,000 mg Oral Daily   levETIRAcetam  250 mg Oral Q M,W,F-1800   lidocaine  1 patch Transdermal Q24H   losartan  100 mg Oral QHS   [START ON 05/15/2022] methylPREDNISolone  12 mg Oral Daily   Followed by   Derrill Memo ON 05/16/2022] methylPREDNISolone  8 mg Oral Daily   Followed by   Derrill Memo ON 05/17/2022] methylPREDNISolone  4 mg Oral Daily   metoprolol succinate  100 mg Oral Daily   pantoprazole  40 mg Oral Daily   sertraline  25 mg Oral Daily   torsemide  100 mg Oral Daily   traZODone  25 mg Oral QHS    Assessment/ Plan:     Principal Problem:   Unable to ambulate Active Problems:   Essential  hypertension   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)   CAD (coronary artery disease)   COPD (chronic obstructive pulmonary disease) (HCC)   Stroke (HCC)   Seizure (HCC)   Diarrhea   Dyslipidemia   ESRD on dialysis ([place])   Right leg pain   Depression   Pulmonary emboli (HCC)   Obesity with body mass index (BMI) of 30.0 to 39.9   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-stage renal disease) (HCC)   Chronic diastolic CHF (congestive heart failure) (HCC)   Generalized weakness  71 year old with history of ESRD on HD MWF, HTN, HLD, DM 2, CVA, depression, CAD, PE on Eliquis, seizure, diastolic CHF, anemia, obesity admitted for right leg pain, weakness and unable to ambulate  #1: ESRD: Patient is on a Monday Wednesday Friday schedule for dialysis.  Had her last dialysis on Friday.  #2: Anemia: We will continue to monitor hemoglobin levels and follow anemia protocol.  #3: Secondary parathyroidism: Monitor PTH, calcium and  phosphorus levels.  Continue Sensipar.  #4: Hypertension: Continue metoprolol and losartan.  Advised on 2 g salt restricted diet.  #5: Diabetes: Continue insulin as ordered.  #6: Leg pain and difficulty ambulation: Possible skilled nursing facility placement.  Spoke to the patient daughter at bedside in detail.    LOS: [place], MD [facility] 10/21/20234:40 PM

## 2022-05-14 NOTE — Hospital Course (Addendum)
71 year old with history of ESRD on HD MWF, HTN, HLD, DM 2, CVA, depression, CAD, PE on Eliquis, seizure, diastolic CHF, anemia, obesity admitted for right leg pain, weakness and unable to ambulate.  Apparently some of the symptoms have been going on for about 3 months.  Negative right lower extremity Doppler on 10/13.  PT/OT recommending rehab.  Eventually due to right lower extremity pain and back discomfort CT of the tib-fib and MRI lumbar spine was done which showed chronic changes and radiculopathy/neuropathy.  Neurosurgery and orthopedic recommended trial of steroids, therapy and outpatient follow-up with physiatry.    Consultants:  Neurosurgery Orthopedic surgery  Procedures: none      ASSESSMENT & PLAN:   Principal Problem:   Unable to ambulate Active Problems:   Right leg pain   ESRD on dialysis ([place])   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-stage renal disease) (HCC)   CAD (coronary artery disease)   Chronic diastolic CHF (congestive heart failure) (HCC)   Essential hypertension   COPD (chronic obstructive pulmonary disease) (HCC)   Depression   Dyslipidemia   Pulmonary emboli (HCC)   Seizure (HCC)   Stroke ([place])   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[place])   Obesity with body mass index (BMI) of 30.0 to 39.9   Diarrhea   Generalized weakness   Unable to ambulate; Right LE Pain Chronic lumbar radiculopathy along with peroneal/peripheral neuropathy Secondary to severe pain in the right leg.  X-ray negative, lower extremity Dopplers is also negative for DVT.  Does have good pulses.  PT/OT recommending SNF.   Currently Lipitor is on hold.  Neurontin 20 mg twice daily started. CK - 29 CT and lumbar spine reviewed showing chronic changes.  Not a good surgical candidate.  Seen by neuro and orthopedic-recommending now trial of for steroids, PT/OT and outpatient follow-up with physiatry    ESRD on dialysis [facility]) MWF Nephro following for HD   Anemia in ESRD  (end-stage renal disease) ([place]) Hemoglobin stable 8.4 (7.8 on 04/11/2022).   Sensipar Follow-up with CBC   CAD (coronary artery disease) No chest pain, on aspirin and Eliquis.   Statin on hold   Chronic diastolic CHF (congestive heart failure) ([place]) Patient does not have leg edema.  CHF is compensated.  2D echo 03/22/2022 showed EF of 50 to 55% with grade 2 diastolic dysfunction.   Volume status will be managed with HD.   On daily torsemide   Essential hypertension Cozaar, Toprol-XL.  Torsemide   COPD (chronic obstructive pulmonary disease) (HCC) Stable Bronchodilators   Depression Continue home medications   Dyslipidemia Hold Lipitor as above   Pulmonary emboli (HCC) Continue Eliquis   Seizure (HCC) Keppra    Stroke (HCC) On aspirin, Eliquis  Holding statin   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) Recent A1c 5.  8, well controlled.  Patient's not taking 70/30 insulin currently. Sliding scale insulin   Obesity with body mass index (BMI) of 30.0 to 39.9 BMI= 37.02,   and BW= 113.7Kg Diet and exercise.   Encourage to lose weight.   Diarrhea Resolved        DVT prophylaxis: Eliquis Pertinent IV fluids/nutrition: no continuous IV fluids Central lines / invasive devices: none  Code Status: Full code Family Communication: spoke w/ daughter Maudie Mercury on the phone at bedside on rounds  Disposition: Inpatient TOC needs: SNF Barriers to discharge / significant pending items: Awaiting SNF placement

## 2022-05-15 DIAGNOSIS — R262 Difficulty in walking, not elsewhere classified: Secondary | ICD-10-CM | POA: Diagnosis not present

## 2022-05-15 LAB — BASIC METABOLIC PANEL
Anion gap: 14 (ref 5–15)
BUN: 67 mg/dL — ABNORMAL HIGH (ref 8–23)
CO2: 27 mmol/L (ref 22–32)
Calcium: 8.3 mg/dL — ABNORMAL LOW (ref 8.9–10.3)
Chloride: 93 mmol/L — ABNORMAL LOW (ref 98–111)
Creatinine, Ser: 7.51 mg/dL — ABNORMAL HIGH (ref 0.44–1.00)
GFR, Estimated: 5 mL/min — ABNORMAL LOW (ref >60)
Glucose, Bld: 171 mg/dL — ABNORMAL HIGH (ref 70–99)
Potassium: 4.3 mmol/L (ref 3.5–5.1)
Sodium: 134 mmol/L — ABNORMAL LOW (ref 135–145)

## 2022-05-15 LAB — CBC
HCT: 26.3 % — ABNORMAL LOW (ref 36.0–46.0)
Hemoglobin: 8.2 g/dL — ABNORMAL LOW (ref 12.0–15.0)
MCH: 27 pg (ref 26.0–34.0)
MCHC: 31.2 g/dL (ref 30.0–36.0)
MCV: 86.5 fL (ref 80.0–100.0)
Platelets: 275 10*3/uL (ref 150–400)
RBC: 3.04 MIL/uL — ABNORMAL LOW (ref 3.87–5.11)
RDW: 14.7 % (ref 11.5–15.5)
WBC: 10.9 10*3/uL — ABNORMAL HIGH (ref 4.0–10.5)
nRBC: 0 % (ref 0.0–0.2)

## 2022-05-15 LAB — GLUCOSE, CAPILLARY
Glucose-Capillary: 148 mg/dL — ABNORMAL HIGH (ref 70–99)
Glucose-Capillary: 180 mg/dL — ABNORMAL HIGH (ref 70–99)
Glucose-Capillary: 221 mg/dL — ABNORMAL HIGH (ref 70–99)
Glucose-Capillary: 237 mg/dL — ABNORMAL HIGH (ref 70–99)

## 2022-05-15 LAB — MAGNESIUM: Magnesium: 1.9 mg/dL (ref 1.7–2.4)

## 2022-05-15 NOTE — Progress Notes (Signed)
PROGRESS NOTE    Cynthia Dean   ZOX:[id number] DOB: September 06, 1950  DOA: 05/09/2022 Date of Service: 05/15/22 PCP: Lowella Bandy, MD     Brief Narrative / Hospital Course:  71 year old with history of ESRD on HD MWF, HTN, HLD, DM 2, CVA, depression, CAD, PE on Eliquis, seizure, diastolic CHF, anemia, obesity admitted for right leg pain, weakness and unable to ambulate.  Apparently some of the symptoms have been going on for about 3 months.  Negative right lower extremity Doppler on 10/13.  PT/OT recommending rehab.  Eventually due to right lower extremity pain and back discomfort CT of the tib-fib and MRI lumbar spine was done which showed chronic changes and radiculopathy/neuropathy.  Neurosurgery and orthopedic recommended trial of steroids, therapy and outpatient follow-up with physiatry.  SNF placement pending.    Consultants:  Neurosurgery Orthopedic surgery  Procedures: none      ASSESSMENT & PLAN:   Principal Problem:   Unable to ambulate Active Problems:   Right leg pain   ESRD on dialysis ([place])   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-stage renal disease) (HCC)   CAD (coronary artery disease)   Chronic diastolic CHF (congestive heart failure) (HCC)   Essential hypertension   COPD (chronic obstructive pulmonary disease) (HCC)   Depression   Dyslipidemia   Pulmonary emboli (HCC)   Seizure (HCC)   Stroke ([place])   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[place])   Obesity with body mass index (BMI) of 30.0 to 39.9   Diarrhea   Generalized weakness   Unable to ambulate; Right LE Pain Chronic lumbar radiculopathy along with peroneal/peripheral neuropathy Secondary to severe pain in the right leg.  X-ray negative, lower extremity Dopplers is also negative for DVT.  Does have good pulses.  PT/OT recommending SNF.   Currently Lipitor is on hold.  Neurontin 20 mg twice daily started. CK - 29 CT and lumbar spine reviewed showing chronic changes.  Not a  good surgical candidate.   Seen by neuro and orthopedic-recommending now trial of for steroids, PT/OT and outpatient follow-up with physiatry    ESRD on dialysis [facility]) MWF Nephro following for HD   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-stage renal disease) ([place]) Hemoglobin stable 8.4 (7.8 on 04/11/2022).   Sensipar Follow-up CBC   CAD (coronary artery disease) No chest pain, on aspirin and Eliquis.   Statin on hold   Chronic diastolic CHF (congestive heart failure) ([place]) Patient does not have leg edema.  CHF is compensated.  2D echo 03/22/2022 showed EF of 50 to 55% with grade 2 diastolic dysfunction.   Volume status will be managed with HD.   On daily torsemide   Essential hypertension Cozaar, Toprol-XL.  Torsemide   COPD (chronic obstructive pulmonary disease) (HCC) Stable Bronchodilators   Depression Continue home medications   Dyslipidemia Hold Lipitor as above   Pulmonary emboli (HCC) Continue Eliquis   Seizure (HCC) Keppra    Stroke (HCC) On aspirin, Eliquis  Holding statin   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) Recent A1c 5.  8, well controlled.  Patient's not taking 70/30 insulin currently. Sliding scale insulin   Obesity with body mass index (BMI) of 30.0 to 39.9 BMI= 37.02,   and BW= 113.7Kg Diet and exercise.   Encourage to lose weight.   Diarrhea Resolved        DVT prophylaxis: Eliquis Pertinent IV fluids/nutrition: no continuous IV fluids Central lines / invasive devices: none  Code Status: Full code  Family Communication: spoke w/ daughter Maudie Mercury on the phone at bedside on rounds yesterday, no updates today  Disposition: Inpatient TOC needs: SNF Barriers to discharge / significant pending items: Awaiting SNF placement             Subjective:  Patient reports doing well today other than persistent hip and leg pain - see above re: workup done. No changes from yesterday. No other complaints.         Objective:  Vitals:   05/14/22 1716 05/14/22 2018 05/15/22 0408 05/15/22 0857  BP: 135/65 (!) 107/45 (!) 159/66 (!) 147/69  Pulse: 66 74 60 65  Resp: '16 17 17 18  '$ Temp: 97.7 F (36.5 C) 99.4 F (37.4 C) 98.6 F (37 C) 97.9 F (36.6 C)  TempSrc: Oral     SpO2: 96% 96% 98% 96%  Weight:      Height:        Intake/Output Summary (Last 24 hours) at 05/15/2022 1337 Last data filed at 05/14/2022 1922 Gross per 24 hour  Intake 240 ml  Output --  Net 240 ml   Filed Weights   05/13/22 0759 05/13/22 1227 05/14/22 0500  Weight: 108.4 kg 106.6 kg 109 kg    Examination:  Constitutional:  VS as above General Appearance: alert, well-nourished, NAD Respiratory: Normal respiratory effort No wheeze No rhonchi No rales Cardiovascular: S1/S2 normal No rub/gallop auscultated No lower extremity edema Gastrointestinal: No tenderness Musculoskeletal:  No clubbing/cyanosis of digits Symmetrical movement in all extremities Neurological: No cranial nerve deficit on limited exam Alert Psychiatric: Fair judgment/insight Normal mood and affect       Scheduled Medications:   apixaban  5 mg Oral BID   aspirin EC  81 mg Oral Daily   Chlorhexidine Gluconate Cloth  6 each Topical Q0600   vitamin D3  1,000 Units Oral Daily   cinacalcet  30 mg Oral Q breakfast   gabapentin  300 mg Oral BID   insulin aspart  0-5 Units Subcutaneous QHS   insulin aspart  0-9 Units Subcutaneous TID WC   levETIRAcetam  1,000 mg Oral Daily   levETIRAcetam  250 mg Oral Q M,W,F-1800   lidocaine  1 patch Transdermal Q24H   losartan  100 mg Oral QHS   [START ON 05/16/2022] methylPREDNISolone  8 mg Oral Daily   Followed by   Derrill Memo ON 05/17/2022] methylPREDNISolone  4 mg Oral Daily   metoprolol succinate  100 mg Oral Daily   pantoprazole  40 mg Oral Daily   sertraline  25 mg Oral Daily   torsemide  100 mg Oral Daily   traZODone  25 mg Oral QHS    Continuous Infusions:  anticoagulant  sodium citrate      PRN Medications:  acetaminophen, alteplase, anticoagulant sodium citrate, diphenoxylate-atropine, fentaNYL (SUBLIMAZE) injection, fluticasone, guaiFENesin, heparin, hydrALAZINE, HYDROmorphone, ipratropium-albuterol, lidocaine (PF), lidocaine-prilocaine, LORazepam, metoprolol tartrate, ondansetron (ZOFRAN) IV, pentafluoroprop-tetrafluoroeth, senna-docusate, traZODone  Antimicrobials:  Anti-infectives (From admission, onward)    None       Data Reviewed: I have personally reviewed following labs and imaging studies  CBC: Recent Labs  Lab 05/11/22 0940 05/12/22 0652 05/13/22 0439 05/14/22 0458 05/15/22 0443  WBC 8.7 9.0 8.8 10.1 10.9*  NEUTROABS 6.0  --   --   --   --   HGB 8.0* 8.0* 9.0* 8.4* 8.2*  HCT 24.8* 25.3* 28.3* 26.6* 26.3*  MCV 85.8 86.6 86.8 86.4 86.5  PLT 221 208 253 276 671   Basic Metabolic Panel: Recent Labs  Lab 05/11/22 0940 05/12/22 0652 05/13/22 0439 05/14/22 0458 05/15/22 0443  NA 137 139 134* 136 134*  K 4.6 4.2 5.4* 4.8 4.3  CL 100 99 94* 96* 93*  CO2 '25 28 28 28 27  '$ GLUCOSE 131* 153* 191* 210* 171*  BUN 50* 35* 55* 43* 67*  CREATININE 8.81* 6.45* 8.11* 6.05* 7.51*  CALCIUM 8.6* 8.4* 8.7* 9.1 8.3*  MG  --  1.8 2.1 1.9 1.9  PHOS 6.5*  --   --   --   --    GFR: Estimated Creatinine Clearance: 9 mL/min (A) (by C-G formula based on SCr of 7.51 mg/dL (H)). Liver Function Tests: Recent Labs  Lab 05/11/22 0940  ALBUMIN 2.7*   No results for input(s): "LIPASE", "AMYLASE" in the last 168 hours. No results for input(s): "AMMONIA" in the last 168 hours. Coagulation Profile: No results for input(s): "INR", "PROTIME" in the last 168 hours. Cardiac Enzymes: Recent Labs  Lab 05/09/22 0054  CKTOTAL 29*   BNP (last 3 results) No results for input(s): "PROBNP" in the last 8760 hours. HbA1C: No results for input(s): "HGBA1C" in the last 72 hours. CBG: Recent Labs  Lab 05/14/22 1158 05/14/22 1718 05/14/22 2124  05/15/22 0857 05/15/22 1140  GLUCAP 163* 229* 212* 148* 180*   Lipid Profile: No results for input(s): "CHOL", "HDL", "LDLCALC", "TRIG", "CHOLHDL", "LDLDIRECT" in the last 72 hours. Thyroid Function Tests: No results for input(s): "TSH", "T4TOTAL", "FREET4", "T3FREE", "THYROIDAB" in the last 72 hours.  Anemia Panel: Recent Labs    05/13/22 0439  FOLATE 6.0   Urine analysis:    Component Value Date/Time   COLORURINE YELLOW (A) 03/20/2022 0125   APPEARANCEUR CLEAR (A) 03/20/2022 0125   APPEARANCEUR Clear 06/14/2014 1510   LABSPEC 1.010 03/20/2022 0125   LABSPEC 1.006 06/14/2014 1510   PHURINE 7.0 03/20/2022 0125   GLUCOSEU 50 (A) 03/20/2022 0125   GLUCOSEU >=500 06/14/2014 1510   HGBUR MODERATE (A) 03/20/2022 0125   BILIRUBINUR NEGATIVE 03/20/2022 0125   BILIRUBINUR Negative 06/14/2014 1510   KETONESUR NEGATIVE 03/20/2022 0125   PROTEINUR 100 (A) 03/20/2022 0125   NITRITE NEGATIVE 03/20/2022 0125   LEUKOCYTESUR NEGATIVE 03/20/2022 0125   LEUKOCYTESUR Negative 06/14/2014 1510   Sepsis Labs: '@LABRCNTIP'$ (procalcitonin:4,lacticidven:4)  No results found for this or any previous visit (from the past 240 hour(s)).       Radiology Studies: DG Tibia/Fibula Right  Result Date: 05/09/2022 CLINICAL DATA:  Right lower leg pain. EXAM: RIGHT TIBIA AND FIBULA - 2 VIEW COMPARISON:  None Available. FINDINGS: No fracture, dislocation, bony lesion or bony destruction identified. Extensive popliteal and tibial arterial vascular calcifications noted. IMPRESSION: No acute findings in the right lower leg. Extensive arterial vascular calcifications. Electronically Signed   By: Aletta Edouard M.D.   On: 05/09/2022 16:35   DG Chest 1 View  Result Date: 05/09/2022 CLINICAL DATA:  Weakness EXAM: CHEST  1 VIEW COMPARISON:  03/19/2022 FINDINGS: Right Port-A-Cath remains in place, unchanged. Cardiomegaly, aortic atherosclerosis. Vascular congestion. No confluent opacities, effusions or overt  edema. No acute bony abnormality. IMPRESSION: Cardiomegaly, vascular congestion Electronically Signed   By: Rolm Baptise M.D.   On: 05/09/2022 03:35   DG Hip Unilat With Pelvis 2-3 Views Right  Result Date: 05/09/2022 CLINICAL DATA:  Pain for 3 months EXAM: DG HIP (WITH OR WITHOUT PELVIS) 2-3V RIGHT COMPARISON:  None Available. FINDINGS: Hip joints and SI joints symmetric. No acute bony abnormality. Specifically, no fracture, subluxation, or dislocation. IMPRESSION: No acute bony abnormality. Electronically Signed  By: Rolm Baptise M.D.   On: 05/09/2022 03:34            LOS: 6 days       Emeterio Reeve, DO [facility] 05/15/2022, 1:37 PM   Staff may message me via secure chat in [place]  but this may not receive immediate response,  please page for urgent matters!  If 7PM-7AM, please contact night-coverage [email or web address]  Dictation software was used to generate the above note. Typos may occur and escape review, as with typed/written notes. Please contact Dr Sheppard Coil directly for clarity if needed.

## 2022-05-15 NOTE — Progress Notes (Signed)
[facility]  PROGRESS NOTE   Subjective:   Patient is comfortable.  Denies any chest pain or shortness of breath.  Objective:  Vital signs: Blood pressure (!) 147/69, pulse 65, temperature 97.9 F (36.6 C), resp. rate 18, height '5\' 9"'$  (1.753 m), weight 109 kg, SpO2 96 %.  Intake/Output Summary (Last 24 hours) at 05/15/2022 1118 Last data filed at 05/14/2022 1922 Gross per 24 hour  Intake 580 ml  Output --  Net 580 ml   Filed Weights   05/13/22 0759 05/13/22 1227 05/14/22 0500  Weight: 108.4 kg 106.6 kg 109 kg     Physical Exam: General:  No acute distress  Head:  Normocephalic, atraumatic. Moist oral mucosal membranes  Eyes:  Anicteric  Neck:  Supple  Lungs:   Clear to auscultation, normal effort  Heart:  S1S2 no rubs  Abdomen:   Soft, nontender, bowel sounds present  Extremities:  peripheral edema.  Neurologic:  Awake, alert, following commands  Skin:  No lesions  Access:     Basic Metabolic Panel: Recent Labs  Lab 05/11/22 0940 05/12/22 0652 05/13/22 0439 05/14/22 0458 05/15/22 0443  NA 137 139 134* 136 134*  K 4.6 4.2 5.4* 4.8 4.3  CL 100 99 94* 96* 93*  CO2 '25 28 28 28 27  '$ GLUCOSE 131* 153* 191* 210* 171*  BUN 50* 35* 55* 43* 67*  CREATININE 8.81* 6.45* 8.11* 6.05* 7.51*  CALCIUM 8.6* 8.4* 8.7* 9.1 8.3*  MG  --  1.8 2.1 1.9 1.9  PHOS 6.5*  --   --   --   --     CBC: Recent Labs  Lab 05/11/22 0940 05/12/22 0652 05/13/22 0439 05/14/22 0458 05/15/22 0443  WBC 8.7 9.0 8.8 10.1 10.9*  NEUTROABS 6.0  --   --   --   --   HGB 8.0* 8.0* 9.0* 8.4* 8.2*  HCT 24.8* 25.3* 28.3* 26.6* 26.3*  MCV 85.8 86.6 86.8 86.4 86.5  PLT 221 208 253 276 275     Urinalysis: No results for input(s): "COLORURINE", "LABSPEC", "PHURINE", "GLUCOSEU", "HGBUR", "BILIRUBINUR", "KETONESUR", "PROTEINUR", "UROBILINOGEN", "NITRITE", "LEUKOCYTESUR" in the last 72 hours.  Invalid input(s): "APPERANCEUR"    Imaging: No results found.   Medications:     anticoagulant sodium citrate      apixaban  5 mg Oral BID   aspirin EC  81 mg Oral Daily   Chlorhexidine Gluconate Cloth  6 each Topical Q0600   vitamin D3  1,000 Units Oral Daily   cinacalcet  30 mg Oral Q breakfast   gabapentin  300 mg Oral BID   insulin aspart  0-5 Units Subcutaneous QHS   insulin aspart  0-9 Units Subcutaneous TID WC   levETIRAcetam  1,000 mg Oral Daily   levETIRAcetam  250 mg Oral Q M,W,F-1800   lidocaine  1 patch Transdermal Q24H   losartan  100 mg Oral QHS   [START ON 05/16/2022] methylPREDNISolone  8 mg Oral Daily   Followed by   Derrill Memo ON 05/17/2022] methylPREDNISolone  4 mg Oral Daily   metoprolol succinate  100 mg Oral Daily   pantoprazole  40 mg Oral Daily   sertraline  25 mg Oral Daily   torsemide  100 mg Oral Daily   traZODone  25 mg Oral QHS    Assessment/ Plan:     Principal Problem:   Unable to ambulate Active Problems:   Essential hypertension   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[place])  CAD (coronary artery disease)   COPD (chronic obstructive pulmonary disease) (HCC)   Stroke (HCC)   Seizure (HCC)   Diarrhea   Dyslipidemia   ESRD on dialysis ([place])   Right leg pain   Depression   Pulmonary emboli (HCC)   Obesity with body mass index (BMI) of 30.0 to 39.9   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-stage renal disease) (HCC)   Chronic diastolic CHF (congestive heart failure) (HCC)   Generalized weakness  71 year old with history of ESRD on HD MWF, HTN, HLD, DM 2, CVA, depression, CAD, PE on Eliquis, seizure, diastolic CHF, anemia, obesity admitted for right leg pain, weakness and unable to ambulate   #1: ESRD: Patient is on a Monday Wednesday Friday schedule for dialysis.  Had her last dialysis on Friday.   #2: Anemia: We will continue to monitor hemoglobin levels and follow anemia protocol.   #3: Secondary parathyroidism: Monitor PTH, calcium and phosphorus levels.  Continue Sensipar.   #4: Hypertension: Continue metoprolol  and losartan.  Advised on 2 g salt restricted diet.   #5: Diabetes: Continue insulin as ordered.   #6: Leg pain and difficulty ambulation: Possible skilled nursing facility placement.   Spoke to the patient daughter at bedside in detail.  We will follow.   LOS: [place], MD [facility] 10/22/202311:18 AM

## 2022-05-15 NOTE — TOC Progression Note (Signed)
Transition of Care [facility]) - Progression Note    Patient Details  Name: Cynthia Dean MRN: [id number] Date of Birth: 06/24/1951  [facility] CM/SW Contact  Candie Chroman, LCSW Phone Number: 05/15/2022, 9:52 AM  Clinical Narrative:   Josem Kaufmann still pending.  Expected Discharge Plan:  (TBD) Barriers to Discharge: Continued Medical Work up  Expected Discharge Plan and Services Expected Discharge Plan:  (TBD)       Living arrangements for the past 2 months: Single Family Home                                       Social Determinants of Health (SDOH) Interventions    Readmission Risk Interventions    10/25/2019    8:43 AM 10/24/2019   10:14 AM  Readmission Risk Prevention Plan  Transportation Screening Complete   PCP or Specialist Appt within 3-5 Days Not Complete   HRI or Home Care Consult Complete Complete  Palliative Care Screening Not Applicable   Medication Review (RN Care Manager) Complete

## 2022-05-16 DIAGNOSIS — N186 End stage renal disease: Secondary | ICD-10-CM | POA: Diagnosis not present

## 2022-05-16 DIAGNOSIS — I5032 Chronic diastolic (congestive) heart failure: Secondary | ICD-10-CM | POA: Diagnosis not present

## 2022-05-16 DIAGNOSIS — M79604 Pain in right leg: Secondary | ICD-10-CM | POA: Diagnosis not present

## 2022-05-16 DIAGNOSIS — R262 Difficulty in walking, not elsewhere classified: Secondary | ICD-10-CM | POA: Diagnosis not present

## 2022-05-16 LAB — CBC
HCT: 27 % — ABNORMAL LOW (ref 36.0–46.0)
Hemoglobin: 8.3 g/dL — ABNORMAL LOW (ref 12.0–15.0)
MCH: 26.5 pg (ref 26.0–34.0)
MCHC: 30.7 g/dL (ref 30.0–36.0)
MCV: 86.3 fL (ref 80.0–100.0)
Platelets: 293 10*3/uL (ref 150–400)
RBC: 3.13 MIL/uL — ABNORMAL LOW (ref 3.87–5.11)
RDW: 14.8 % (ref 11.5–15.5)
WBC: 10 10*3/uL (ref 4.0–10.5)
nRBC: 0 % (ref 0.0–0.2)

## 2022-05-16 LAB — BASIC METABOLIC PANEL
Anion gap: 14 (ref 5–15)
BUN: 83 mg/dL — ABNORMAL HIGH (ref 8–23)
CO2: 26 mmol/L (ref 22–32)
Calcium: 8.6 mg/dL — ABNORMAL LOW (ref 8.9–10.3)
Chloride: 96 mmol/L — ABNORMAL LOW (ref 98–111)
Creatinine, Ser: 9.78 mg/dL — ABNORMAL HIGH (ref 0.44–1.00)
GFR, Estimated: 4 mL/min — ABNORMAL LOW (ref >60)
Glucose, Bld: 186 mg/dL — ABNORMAL HIGH (ref 70–99)
Potassium: 5.2 mmol/L — ABNORMAL HIGH (ref 3.5–5.1)
Sodium: 136 mmol/L (ref 135–145)

## 2022-05-16 LAB — GLUCOSE, CAPILLARY
Glucose-Capillary: 154 mg/dL — ABNORMAL HIGH (ref 70–99)
Glucose-Capillary: 137 mg/dL — ABNORMAL HIGH (ref 70–99)
Glucose-Capillary: 160 mg/dL — ABNORMAL HIGH (ref 70–99)

## 2022-05-16 LAB — MAGNESIUM: Magnesium: 2 mg/dL (ref 1.7–2.4)

## 2022-05-16 MED ORDER — GABAPENTIN 50 MG PO TABS: 300.0000 mg | ORAL_TABLET | Freq: Two times a day (BID) | ORAL | 0 refills | Status: DC

## 2022-05-16 MED ORDER — LIDOCAINE 5 % EX PTCH: 1.0000 | MEDICATED_PATCH | CUTANEOUS | 0 refills | Status: DC

## 2022-05-16 MED ORDER — TRAZODONE HCL 50 MG PO TABS: 25.0000 mg | ORAL_TABLET | Freq: Every day | ORAL | 0 refills | Status: DC

## 2022-05-16 MED ORDER — METHYLPREDNISOLONE 4 MG PO TABS: 4.0000 mg | ORAL_TABLET | Freq: Every day | ORAL | 0 refills | Status: DC

## 2022-05-16 MED ORDER — TRAZODONE HCL 50 MG PO TABS: 50.0000 mg | ORAL_TABLET | Freq: Every evening | ORAL | 0 refills | Status: DC | PRN

## 2022-05-16 MED ORDER — HYDROMORPHONE HCL 2 MG PO TABS: 2.0000 mg | ORAL_TABLET | Freq: Two times a day (BID) | ORAL | 0 refills | Status: AC | PRN

## 2022-05-16 MED ORDER — APIXABAN 5 MG PO TABS: 5.0000 mg | ORAL_TABLET | Freq: Two times a day (BID) | ORAL | 0 refills | Status: DC

## 2022-05-16 MED ORDER — HYDROMORPHONE HCL 2 MG PO TABS: 2.0000 mg | ORAL_TABLET | Freq: Two times a day (BID) | ORAL | 0 refills | Status: DC | PRN

## 2022-05-16 NOTE — Progress Notes (Signed)
OT Cancellation Note  Patient Details Name: Cynthia Dean MRN: [id number] DOB: 27-Jun-1951   Cancelled Treatment:    Reason Eval/Treat Not Completed: Patient at procedure or test/ unavailable. OT arrived to room to see pt who is currently at HD. OT will re-attempt at next available time.   Darleen Crocker, [place], OTR/L , CBIS ascom [phone]  05/16/22, 9:06 AM

## 2022-05-16 NOTE — Progress Notes (Signed)
PT did 3.5 hrs of tx, tolerated well  UF = 1000 ml  Report given to floor RN.   05/16/22 1248  Vitals  Temp 98.4 F (36.9 C)  Temp Source Oral  BP 136/64  MAP (mmHg) 86  BP Location Right Arm  BP Method Automatic  Patient Position (if appropriate) Lying  Pulse Rate 63  Pulse Rate Source Monitor  ECG Heart Rate 63  Resp 18  Oxygen Therapy  SpO2 100 %  O2 Device Nasal Cannula  O2 Flow Rate (L/min) 2 L/min  Patient Activity (if Appropriate) In bed  Pulse Oximetry Type Continuous  During Treatment Monitoring  Blood Flow Rate (mL/min) 200 mL/min  HD Safety Checks Performed Yes  Intra-Hemodialysis Comments Tx completed  Dialysis Fluid Bolus Normal Saline  Post Treatment  Dialyzer Clearance Clear  Duration of HD Treatment -hour(s) 3.5 hour(s)  Hemodialysis Intake (mL) 600 mL  Liters Processed 84  Fluid Removed 1000 mL  Tolerated HD Treatment Yes  AVG/AVF Arterial Site Held (minutes) 10 minutes  AVG/AVF Venous Site Held (minutes) 10 minutes

## 2022-05-16 NOTE — Care Management Important Message (Signed)
Important Message  Patient Details  Name: Cynthia Dean MRN: [id number] Date of Birth: 11-Jan-1951   Medicare Important Message Given:  Yes     [name] 05/16/2022, 3:41 PM

## 2022-05-16 NOTE — Discharge Summary (Signed)
Physician Discharge Summary   [name] MRN: [id number] DOB: 24-Nov-1950  Admit date:     05/09/2022  Discharge date: 05/16/22  Discharge Physician: Annita Brod   PCP: Lowella Bandy, MD   Recommendations at discharge:   Medication clarification: Patient will continue on Eliquis 5 mg p.o. twice daily New medication: Neurontin 300 mg p.o. twice daily New medication: Dilaudid 2 mg every 12 hours as needed for severe pain New medication: Lidoderm patch on for 12 hours, off for 12 hours Patient going to skilled nursing New medication: Medrol 4 mg p.o. daily on 10/24 only Medication change: Insulin 70/30 discontinued  Discharge Diagnoses: Principal Problem:   Unable to ambulate Active Problems:   Right leg pain   ESRD on dialysis ([place])   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-stage renal disease) (HCC)   CAD (coronary artery disease)   Chronic diastolic CHF (congestive heart failure) ([place])   Essential hypertension   COPD (chronic obstructive pulmonary disease) ([place])   Depression   Dyslipidemia   Pulmonary emboli (HCC)   Seizure ([place])   Stroke ([place])   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[place])   Obesity with body mass index (BMI) of 30.0 to 39.9   Diarrhea   Generalized weakness  Resolved Problems:   * No resolved hospital problems. *  Hospital Course: 71 year old with history of ESRD on HD MWF, HTN, HLD, DM 2, CVA, depression, CAD, PE on Eliquis, seizure, diastolic CHF, anemia, obesity admitted on 10/16 for right leg pain, weakness and unable to ambulate.  Apparently some of the symptoms have been going on for about 3 months.  Negative right lower extremity Doppler on 10/13.  PT/OT recommending rehab.  Patient went work-up with believe that right lower extremity pain and back discomfort CT of the tib-fib and MRI lumbar spine was done which showed chronic changes and radiculopathy/neuropathy.  Patient treated with Neurontin, Lidoderm patch and  opioid medication.  Neurosurgery and orthopedic recommended trial of steroids, therapy and outpatient follow-up with physiatry.  Excepted for skilled nursing on 10/23.    Consultants:  Neurosurgery Orthopedic surgery Nephrology  Procedures: none      ASSESSMENT & PLAN:   Principal Problem:   Unable to ambulate Active Problems:   Right leg pain   ESRD on dialysis ([place])   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-stage renal disease) (HCC)   CAD (coronary artery disease)   Chronic diastolic CHF (congestive heart failure) (HCC)   Essential hypertension   COPD (chronic obstructive pulmonary disease) (HCC)   Depression   Dyslipidemia   Pulmonary emboli (HCC)   Seizure (HCC)   Stroke ([place])   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[place])   Obesity with body mass index (BMI) of 30.0 to 39.9   Diarrhea   Generalized weakness   Unable to ambulate; Right LE Pain Chronic lumbar radiculopathy along with peroneal/peripheral neuropathy Secondary to severe pain in the right leg.  X-ray negative, lower extremity Dopplers is also negative for DVT.  Does have good pulses.  PT/OT recommending SNF.   Initially Lipitor is on hold.  Neurontin 300 mg twice daily started. CK - 29, Lipitor restarted on discharge CT and lumbar spine reviewed showing chronic changes.  Not a good surgical candidate.   Seen by neuro and orthopedic-recommending now trial of for steroids, PT/OT and outpatient follow-up with physiatry    ESRD on dialysis [facility]) MWF Nephro following for HD   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-stage renal disease) (  [place]) Hemoglobin stable 8.4 (7.8 on 04/11/2022).   Sensipar Follow-up CBC   CAD (coronary artery disease) No chest pain, on aspirin and Eliquis.   Statin restarted   Chronic diastolic CHF (congestive heart failure) ([place]) Patient does not have leg edema.  CHF is compensated.  2D echo 03/22/2022 showed EF of 50 to 55% with grade 2 diastolic dysfunction.   Volume status will be managed  with HD.   On daily torsemide   Essential hypertension Cozaar, Toprol-XL.  Torsemide   COPD (chronic obstructive pulmonary disease) (HCC) Stable Bronchodilators   Depression Continue home medications   Dyslipidemia Hold Lipitor as above   Pulmonary emboli (HCC) Continue Eliquis   Seizure (HCC) Keppra    Stroke (HCC) On aspirin, Eliquis  Holding statin   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) Recent A1c 5.  8, well controlled.  Patient's not taking 70/30 insulin currently. Sliding scale insulin   Obesity with body mass index (BMI) of 30.0 to 39.9 BMI= 37.02,   and BW= 113.7Kg Diet and exercise.   Encourage to lose weight.   Diarrhea Resolved     Assessment and Plan:       Pain control - [place] Controlled Substance Reporting System database was reviewed. and patient was instructed, not to drive, operate heavy machinery, perform activities at heights, swimming or participation in water activities or provide baby-sitting services while on Pain, Sleep and Anxiety Medications; until their outpatient Physician has advised to do so again. Also recommended to not to take more than prescribed Pain, Sleep and Anxiety Medications.   Disposition: Skilled nursing facility Diet recommendation:  Discharge Diet Orders (From admission, onward)     Start     Ordered   05/16/22 0000  Diet renal with fluid restriction        05/16/22 1401           Renal diet DISCHARGE MEDICATION: Allergies as of 05/16/2022       Reactions   Oxycodone Nausea And Vomiting   Oxycodone-acetaminophen Other (See Comments)   Wound Dressing Adhesive    Hydrocodone    Intolerant more than allergic   Tape Itching   Skin Dermatitis/itching (tape adhesive) Skin Dermatitis/itching (tape adhesive)   Tapentadol Itching   Skin Dermatitis/itching (tape adhesive) Skin Dermatitis/itching (tape adhesive)        Medication List     STOP taking these  medications    NovoLIN 70/30 (70-30) 100 UNIT/ML injection Generic drug: insulin NPH-regular Human       TAKE these medications    acetaminophen 500 MG tablet Commonly known as: TYLENOL Take 500 mg by mouth every 6 (six) hours as needed.   apixaban 5 MG Tabs tablet Commonly known as: ELIQUIS Take 1 tablet (5 mg total) by mouth 2 (two) times daily. What changed: additional instructions   aspirin EC 81 MG tablet Take 81 mg by mouth daily. Swallow whole.   atorvastatin 80 MG tablet Commonly known as: LIPITOR Take 80 mg by mouth every evening.   cinacalcet 30 MG tablet Commonly known as: SENSIPAR Take 30 mg by mouth daily with breakfast.   diphenoxylate-atropine 2.5-0.025 MG tablet Commonly known as: LOMOTIL SMARTSIG:1 Tablet(s) By Mouth Every 12 Hours PRN   fluticasone 50 MCG/ACT nasal spray Commonly known as: FLONASE Place 1 spray into both nostrils daily.   Gabapentin 50 MG Tabs Take 300 mg by mouth 2 (two) times daily.   glucose 4 GM chewable tablet Chew 1 tablet by  mouth as needed for low blood sugar.   HYDROmorphone 2 MG tablet Commonly known as: Dilaudid Take 1 tablet (2 mg total) by mouth every 12 (twelve) hours as needed for up to 5 days for severe pain.   Ipratropium-Albuterol 20-100 MCG/ACT Aers respimat Commonly known as: COMBIVENT Inhale 1 puff into the lungs every 6 (six) hours. What changed: when to take this   levETIRAcetam 1000 MG tablet Commonly known as: KEPPRA Take 1 tablet (1,000 mg total) by mouth daily.   levETIRAcetam 250 MG tablet Commonly known as: KEPPRA Take 250 mg by mouth 3 (three) times a week.   lidocaine 5 % Commonly known as: LIDODERM Place 1 patch onto the skin daily. Remove & Discard patch within 12 hours or as directed by MD Start taking on: May 17, 2022   lidocaine-prilocaine cream Commonly known as: EMLA Apply 1 application topically as needed (for port access).   loperamide 2 MG capsule Commonly known as:  IMODIUM Take 2 mg by mouth 4 (four) times daily as needed.   losartan 100 MG tablet Commonly known as: COZAAR Take 100 mg by mouth at bedtime.   methylPREDNISolone 4 MG tablet Commonly known as: MEDROL Take 1 tablet (4 mg total) by mouth daily. Start taking on: May 17, 2022   metoprolol succinate 100 MG 24 hr tablet Commonly known as: TOPROL-XL Take 100 mg by mouth daily.   omeprazole 20 MG capsule Commonly known as: PRILOSEC Take 20 mg by mouth 2 (two) times daily.   sertraline 25 MG tablet Commonly known as: ZOLOFT Take 25 mg by mouth daily.   torsemide 100 MG tablet Commonly known as: DEMADEX Take 100 mg by mouth daily.   traZODone 50 MG tablet Commonly known as: DESYREL Take 0.5 tablets (25 mg total) by mouth at bedtime.   vitamin D3 25 MCG tablet Commonly known as: CHOLECALCIFEROL Take 1 tablet (1,000 Units total) by mouth daily.        Contact information for after-discharge care     Destination     [facility] Preferred SNF .   Service: Skilled Nursing Contact information: [address] [place] [phone]                    Discharge Exam: Danley Danker Weights   05/16/22 0417 05/16/22 0859 05/16/22 1310  Weight: 111.3 kg 111 kg 109.7 kg   General: Alert and oriented x3, no acute distress cardiovascular: Regular rate and rhythm, S1-S2 Lungs: Clear to auscultation bilaterally  Condition at discharge: good  The results of significant diagnostics from this hospitalization (including imaging, microbiology, ancillary and laboratory) are listed below for reference.   Imaging Studies: MR LUMBAR SPINE WO CONTRAST  Result Date: 05/11/2022 CLINICAL DATA:  Initial evaluation for lumbar radiculopathy, progressively worsening right leg pain. History of end-stage renal disease on hemodialysis. EXAM: MRI LUMBAR SPINE WITHOUT CONTRAST TECHNIQUE: Multiplanar, multisequence MR imaging of the lumbar spine was  performed. No intravenous contrast was administered. COMPARISON:  Prior MRI from 01/21/2008. FINDINGS: Segmentation: Standard. Lowest well-formed disc space labeled the L5-S1 level. Alignment: Trace 2 mm facet mediated anterolisthesis of L3 on L4 and L4 on L5, with trace retrolisthesis of L5 on S1. Vertebrae: Vertebral body height maintained without acute or chronic fracture. Bone marrow signal intensity within normal limits. No worrisome osseous lesions. No abnormal marrow edema. Conus medullaris and cauda equina: Conus extends to the L2 level. Conus and cauda equina appear normal. Paraspinal and other soft tissues:  Mild edema within the lower posterior paraspinous soft tissues adjacent to the L4-5 and L5-S1 facets, likely reactive in nature due to adjacent facet arthritis. No collections. Atrophic multi-cystic kidneys, consistent with history of end-stage renal disease and dialysis. 1.8 cm simple cyst partially visualize within the left adnexa. This is almost certainly benign given size, with no follow-up imaging recommended. Note: This recommendation does not apply to premenarchal patients and to those with increased risk (genetic, family history, elevated tumor markers or other high-risk factors) of ovarian cancer. Reference: JACR 2020 Feb; 17(2):248-254 Disc levels: T12-L1: Disc desiccation without significant disc bulge. Bilateral facet hypertrophy. No significant spinal stenosis. Foramina remain patent. L1-2: Disc desiccation without significant disc bulge. Mild facet hypertrophy. No canal or foraminal stenosis. L2-3: Disc desiccation with mild annular disc bulge. Mild to moderate facet hypertrophy. Rounded 6 mm nodular lesion at the anteromedial aspect of the left L2-3 facet favored to reflect a small complex synovial cyst. Resultant mild canal with moderate left lateral recess stenosis. Mild bilateral foraminal narrowing. L3-4: Mild disc bulge with disc desiccation. Moderate facet and ligament flavum  hypertrophy with associated trace joint effusions. No significant spinal stenosis. Moderate bilateral L3 foraminal narrowing. L4-5: Trace anterolisthesis. Diffuse disc bulge with disc desiccation. Disc bulging slightly eccentric to the left. Moderate facet and ligament flavum hypertrophy with associated small joint effusions. Resultant mild canal with moderate bilateral subarticular stenosis. Resultant moderate left with mild to moderate right L4 foraminal narrowing. L5-S1: Trace retrolisthesis. Degenerative intervertebral disc space narrowing with diffuse disc bulge and disc desiccation. Associated reactive endplate spurring. Superimposed small central disc protrusion with annular fissure indents the ventral thecal sac. Mild facet hypertrophy. No significant spinal stenosis. Moderate bilateral L5 foraminal narrowing. IMPRESSION: 1. Multifactorial degenerative changes at L4-5 with resultant moderate bilateral subarticular stenosis, with moderate left greater than right L4 foraminal narrowing. 2. Moderate bilateral L3 and L5 foraminal stenosis related to disc bulge, reactive endplate spurring, and facet degeneration. 3. 6 mm nodular lesion at the anteromedial aspect of the left L2-3 facet, favored to reflect a small complex synovial cyst. Resultant mild canal with moderate left lateral recess stenosis at this level. 4. Lower lumbar facet hypertrophy, most pronounced at L4-5. Finding could contribute to lower back pain. Electronically Signed   By: Jeannine Boga M.D.   On: 05/11/2022 21:04   CT TIBIA FIBULA RIGHT WO CONTRAST  Result Date: 05/11/2022 CLINICAL DATA:  Foot pain, persistent posttraumatic. Right leg pain with weakness and inability to ambulate. EXAM: CT OF THE LOWER RIGHT EXTREMITY WITHOUT CONTRAST TECHNIQUE: Multidetector CT imaging of the right lower leg was performed according to the standard protocol. RADIATION DOSE REDUCTION: This exam was performed according to the departmental  dose-optimization program which includes automated exposure control, adjustment of the mA and/or kV according to patient size and/or use of iterative reconstruction technique. COMPARISON:  Right hip and lower leg radiographs 05/09/2022. Doppler ultrasound 03/20/2022. FINDINGS: Bones/Joint/Cartilage Images extend from just above the right knee through the right ankle. The right foot is incompletely visualized. There is no evidence of acute fracture or dislocation. Moderate degenerative changes are present in the patellofemoral and medial compartments of the right knee. No significant knee joint effusion. No significant ankle joint effusion or arthropathy. Ligaments Suboptimally assessed by CT. Muscles and Tendons Mild generalized muscular atrophy. The patellar tendon is intact. The ankle tendons appear intact, although not all of their distal insertions are included. Soft tissues Prominent diffuse vascular calcifications. No focal fluid collection, foreign body or soft tissue  emphysema. Minimal subcutaneous edema anteriorly in the distal lower leg. IMPRESSION: 1. No acute osseous findings identified in the right lower leg. 2. Moderate degenerative changes in the patellofemoral and medial compartments of the right knee. 3. Diffuse vascular calcifications. 4. Mild diffuse muscular atrophy. Electronically Signed   By: Richardean Sale M.D.   On: 05/11/2022 15:19   DG Tibia/Fibula Right  Result Date: 05/09/2022 CLINICAL DATA:  Right lower leg pain. EXAM: RIGHT TIBIA AND FIBULA - 2 VIEW COMPARISON:  None Available. FINDINGS: No fracture, dislocation, bony lesion or bony destruction identified. Extensive popliteal and tibial arterial vascular calcifications noted. IMPRESSION: No acute findings in the right lower leg. Extensive arterial vascular calcifications. Electronically Signed   By: Aletta Edouard M.D.   On: 05/09/2022 16:35   DG Chest 1 View  Result Date: 05/09/2022 CLINICAL DATA:  Weakness EXAM: CHEST  1  VIEW COMPARISON:  03/19/2022 FINDINGS: Right Port-A-Cath remains in place, unchanged. Cardiomegaly, aortic atherosclerosis. Vascular congestion. No confluent opacities, effusions or overt edema. No acute bony abnormality. IMPRESSION: Cardiomegaly, vascular congestion Electronically Signed   By: Rolm Baptise M.D.   On: 05/09/2022 03:35   DG Hip Unilat With Pelvis 2-3 Views Right  Result Date: 05/09/2022 CLINICAL DATA:  Pain for 3 months EXAM: DG HIP (WITH OR WITHOUT PELVIS) 2-3V RIGHT COMPARISON:  None Available. FINDINGS: Hip joints and SI joints symmetric. No acute bony abnormality. Specifically, no fracture, subluxation, or dislocation. IMPRESSION: No acute bony abnormality. Electronically Signed   By: Rolm Baptise M.D.   On: 05/09/2022 03:34    Microbiology: Results for orders placed or performed during the hospital encounter of 03/19/22  Resp Panel by RT-PCR (Flu A&B, Covid) Anterior Nasal Swab     Status: None   Collection Time: 03/19/22  9:28 PM   Specimen: Anterior Nasal Swab  Result Value Ref Range Status   SARS Coronavirus 2 by RT PCR NEGATIVE NEGATIVE Final    Comment: (NOTE) SARS-CoV-2 target nucleic acids are NOT DETECTED.  The SARS-CoV-2 RNA is generally detectable in upper respiratory specimens during the acute phase of infection. The lowest concentration of SARS-CoV-2 viral copies this assay can detect is 138 copies/mL. A negative result does not preclude SARS-Cov-2 infection and should not be used as the sole basis for treatment or other patient management decisions. A negative result may occur with  improper specimen collection/handling, submission of specimen other than nasopharyngeal swab, presence of viral mutation(s) within the areas targeted by this assay, and inadequate number of viral copies(<138 copies/mL). A negative result must be combined with clinical observations, patient history, and epidemiological information. The expected result is Negative.  Fact Sheet  for Patients:  EntrepreneurPulse.com.au  Fact Sheet for Healthcare Providers:  IncredibleEmployment.be  This test is no t yet approved or cleared by the [place] FDA and  has been authorized for detection and/or diagnosis of SARS-CoV-2 by FDA under an Emergency Use Authorization (EUA). This EUA will remain  in effect (meaning this test can be used) for the duration of the COVID-19 declaration under Section 564(b)(1) of the Act, 21 U.S.C.section 360bbb-3(b)(1), unless the authorization is terminated  or revoked sooner.       Influenza A by PCR NEGATIVE NEGATIVE Final   Influenza B by PCR NEGATIVE NEGATIVE Final    Comment: (NOTE) The Xpert Xpress SARS-CoV-2/FLU/RSV plus assay is intended as an aid in the diagnosis of influenza from Nasopharyngeal swab specimens and should not be used as a sole basis for treatment. Nasal washings and  aspirates are unacceptable for Xpert Xpress SARS-CoV-2/FLU/RSV testing.  Fact Sheet for Patients: EntrepreneurPulse.com.au  Fact Sheet for Healthcare Providers: IncredibleEmployment.be  This test is not yet approved or cleared by the [place] FDA and has been authorized for detection and/or diagnosis of SARS-CoV-2 by FDA under an Emergency Use Authorization (EUA). This EUA will remain in effect (meaning this test can be used) for the duration of the COVID-19 declaration under Section 564(b)(1) of the Act, 21 U.S.C. section 360bbb-3(b)(1), unless the authorization is terminated or revoked.  Performed at [facility], [address] [place], [place] [address]   Blood Culture (routine x 2)     Status: Abnormal   Collection Time: 03/19/22  9:28 PM   Specimen: BLOOD  Result Value Ref Range Status   Specimen Description   Final    BLOOD RIGHT ANTECUBITAL Performed at [facility], [address] [place], [place] [address]    Special Requests    Final    BOTTLES DRAWN AEROBIC AND ANAEROBIC Blood Culture adequate volume Performed at [facility], [place], [place] [address]    Culture  Setup Time   Final    GRAM POSITIVE COCCI IN BOTH AEROBIC AND ANAEROBIC BOTTLES CRITICAL RESULT CALLED TO, READ BACK BY AND VERIFIED WITH: CAROLYN CHILDS 03/20/22 1457 AMK Performed at [facility], [address] [place], [place] [address]    Culture STAPHYLOCOCCUS CAPITIS (A)  Final   Report Status 03/23/2022 FINAL  Final   Organism ID, Bacteria STAPHYLOCOCCUS CAPITIS  Final      Susceptibility   Staphylococcus capitis - MIC*    CIPROFLOXACIN <=0.5 SENSITIVE Sensitive     ERYTHROMYCIN <=0.25 SENSITIVE Sensitive     GENTAMICIN <=0.5 SENSITIVE Sensitive     OXACILLIN <=0.25 SENSITIVE Sensitive     TETRACYCLINE <=1 SENSITIVE Sensitive     VANCOMYCIN <=0.5 SENSITIVE Sensitive     TRIMETH/SULFA <=10 SENSITIVE Sensitive     CLINDAMYCIN <=0.25 SENSITIVE Sensitive     RIFAMPIN <=0.5 SENSITIVE Sensitive     Inducible Clindamycin NEGATIVE Sensitive     * STAPHYLOCOCCUS CAPITIS  Blood Culture ID Panel (Reflexed)     Status: Abnormal   Collection Time: 03/19/22  9:28 PM  Result Value Ref Range Status   Enterococcus faecalis NOT DETECTED NOT DETECTED Final   Enterococcus Faecium NOT DETECTED NOT DETECTED Final   Listeria monocytogenes NOT DETECTED NOT DETECTED Final   Staphylococcus species DETECTED (A) NOT DETECTED Final    Comment: CRITICAL RESULT CALLED TO, READ BACK BY AND VERIFIED WITH: CAROLYN CHILDS 03/20/22 1457 AMK    Staphylococcus aureus (BCID) NOT DETECTED NOT DETECTED Final   Staphylococcus epidermidis NOT DETECTED NOT DETECTED Final   Staphylococcus lugdunensis NOT DETECTED NOT DETECTED Final   Streptococcus species NOT DETECTED NOT DETECTED Final   Streptococcus agalactiae NOT DETECTED NOT DETECTED Final   Streptococcus pneumoniae NOT DETECTED NOT DETECTED Final   Streptococcus pyogenes NOT  DETECTED NOT DETECTED Final   A.calcoaceticus-baumannii NOT DETECTED NOT DETECTED Final   Bacteroides fragilis NOT DETECTED NOT DETECTED Final   Enterobacterales NOT DETECTED NOT DETECTED Final   Enterobacter cloacae complex NOT DETECTED NOT DETECTED Final   Escherichia coli NOT DETECTED NOT DETECTED Final   Klebsiella aerogenes NOT DETECTED NOT DETECTED Final   Klebsiella oxytoca NOT DETECTED NOT DETECTED Final   Klebsiella pneumoniae NOT DETECTED NOT DETECTED Final   Proteus species NOT DETECTED NOT DETECTED Final   Salmonella species NOT DETECTED NOT DETECTED Final  Serratia marcescens NOT DETECTED NOT DETECTED Final   Haemophilus influenzae NOT DETECTED NOT DETECTED Final   Neisseria meningitidis NOT DETECTED NOT DETECTED Final   Pseudomonas aeruginosa NOT DETECTED NOT DETECTED Final   Stenotrophomonas maltophilia NOT DETECTED NOT DETECTED Final   Candida albicans NOT DETECTED NOT DETECTED Final   Candida auris NOT DETECTED NOT DETECTED Final   Candida glabrata NOT DETECTED NOT DETECTED Final   Candida krusei NOT DETECTED NOT DETECTED Final   Candida parapsilosis NOT DETECTED NOT DETECTED Final   Candida tropicalis NOT DETECTED NOT DETECTED Final   Cryptococcus neoformans/gattii NOT DETECTED NOT DETECTED Final    Comment: Performed at [facility], [address] [place], [place] [address]  Blood Culture (routine x 2)     Status: Abnormal   Collection Time: 03/19/22 10:23 PM   Specimen: BLOOD  Result Value Ref Range Status   Specimen Description   Final    BLOOD BLOOD RIGHT FOREARM Performed at [facility], [address] [place], [place] [address]    Special Requests   Final    BOTTLES DRAWN AEROBIC AND ANAEROBIC Blood Culture adequate volume Performed at [facility], [address] [place], [place] [address]    Culture  Setup Time   Final    GRAM POSITIVE COCCI IN BOTH AEROBIC AND ANAEROBIC BOTTLES CRITICAL RESULT CALLED TO, READ BACK  BY AND VERIFIED WITH: ELVERA MADUEME 03/20/22 1545 AMK Performed at [facility], [place], [place] [address]    Culture (A)  Final    STAPHYLOCOCCUS CAPITIS SUSCEPTIBILITIES PERFORMED ON PREVIOUS CULTURE WITHIN THE LAST 5 DAYS. Performed at [facility], [place] [address], [place], [place] [address]    Report Status 03/22/2022 FINAL  Final  Urine Culture     Status: Abnormal   Collection Time: 03/20/22  1:25 AM   Specimen: In/Out Cath Urine  Result Value Ref Range Status   Specimen Description   Final    IN/OUT CATH URINE Performed at [facility], [address] [place], [place] [address]    Special Requests   Final    NONE Performed at [facility], [address] [place], [place] [address]    Culture 4,000 COLONIES/mL STAPHYLOCOCCUS EPIDERMIDIS (A)  Final   Report Status 03/22/2022 FINAL  Final   Organism ID, Bacteria STAPHYLOCOCCUS EPIDERMIDIS (A)  Final      Susceptibility   Staphylococcus epidermidis - MIC*    CIPROFLOXACIN <=0.5 SENSITIVE Sensitive     GENTAMICIN <=0.5 SENSITIVE Sensitive     NITROFURANTOIN <=16 SENSITIVE Sensitive     OXACILLIN <=0.25 SENSITIVE Sensitive     TETRACYCLINE <=1 SENSITIVE Sensitive     VANCOMYCIN 2 SENSITIVE Sensitive     TRIMETH/SULFA 80 RESISTANT Resistant     CLINDAMYCIN >=8 RESISTANT Resistant     RIFAMPIN <=0.5 SENSITIVE Sensitive     Inducible Clindamycin NEGATIVE Sensitive     * 4,000 COLONIES/mL STAPHYLOCOCCUS EPIDERMIDIS  Culture, blood (Routine X 2) w Reflex to ID Panel     Status: None   Collection Time: 03/21/22 11:00 AM   Specimen: BLOOD  Result Value Ref Range Status   Specimen Description BLOOD BLOOD RIGHT FOREARM  Final   Special Requests   Final    BOTTLES DRAWN AEROBIC AND ANAEROBIC Blood Culture adequate volume   Culture   Final    NO GROWTH 5 DAYS Performed at [facility], [address] [place], [place] [address]  Report Status 03/26/2022 FINAL   Final  Culture, blood (Routine X 2) w Reflex to ID Panel     Status: None   Collection Time: 03/22/22 11:53 AM   Specimen: BLOOD RIGHT HAND  Result Value Ref Range Status   Specimen Description BLOOD RIGHT HAND  Final   Special Requests   Final    BOTTLES DRAWN AEROBIC AND ANAEROBIC Blood Culture adequate volume   Culture   Final    NO GROWTH 5 DAYS Performed at [facility], [address] [place], [place] [address]    Report Status 03/27/2022 FINAL  Final  Aerobic/Anaerobic Culture w Gram Stain (surgical/deep wound)     Status: None   Collection Time: 03/25/22 10:30 AM   Specimen: Wound; Blood  Result Value Ref Range Status   Specimen Description WOUND LEFT BREAST  Final   Special Requests BLOOD HEMATOMA  Final   Gram Stain   Final    RARE WBC PRESENT, PREDOMINANTLY MONONUCLEAR NO ORGANISMS SEEN    Culture   Final    No growth aerobically or anaerobically. Performed at [facility], [place] [address], [place], [place] [address]    Report Status 03/30/2022 FINAL  Final    Labs: CBC: Recent Labs  Lab 05/11/22 0940 05/12/22 9024 05/13/22 0439 05/14/22 0458 05/15/22 0443 05/16/22 0455  WBC 8.7 9.0 8.8 10.1 10.9* 10.0  NEUTROABS 6.0  --   --   --   --   --   HGB 8.0* 8.0* 9.0* 8.4* 8.2* 8.3*  HCT 24.8* 25.3* 28.3* 26.6* 26.3* 27.0*  MCV 85.8 86.6 86.8 86.4 86.5 86.3  PLT 221 208 253 276 275 097   Basic Metabolic Panel: Recent Labs  Lab 05/11/22 0940 05/12/22 0652 05/13/22 0439 05/14/22 0458 05/15/22 0443 05/16/22 0455  NA 137 139 134* 136 134* 136  K 4.6 4.2 5.4* 4.8 4.3 5.2*  CL 100 99 94* 96* 93* 96*  CO2 '25 28 28 28 27 26  '$ GLUCOSE 131* 153* 191* 210* 171* 186*  BUN 50* 35* 55* 43* 67* 83*  CREATININE 8.81* 6.45* 8.11* 6.05* 7.51* 9.78*  CALCIUM 8.6* 8.4* 8.7* 9.1 8.3* 8.6*  MG  --  1.8 2.1 1.9 1.9 2.0  PHOS 6.5*  --   --   --   --   --    Liver Function Tests: Recent Labs  Lab 05/11/22 0940  ALBUMIN 2.7*   CBG: Recent Labs  Lab  05/15/22 0857 05/15/22 1140 05/15/22 1613 05/15/22 2142 05/16/22 0817  GLUCAP 148* 180* 221* 237* 154*    Discharge time spent: less than 30 minutes.  Signed: Annita Brod, MD [facility] 05/16/2022

## 2022-05-16 NOTE — TOC Transition Note (Addendum)
Transition of Care [facility]) - CM/SW Discharge Note   Patient Details  Name: Brigette Hopfer MRN: [id number] Date of Birth: Dec 20, 1950  [facility] of [facility] CM/SW Contact:  [name], RN Phone Number: 05/16/2022, 3:14 PM   Clinical Narrative:     [facility] Anticipated DC date:05/16/22  Family notified: Daughter [place]  Transport LE:[id number]  Per MD patient ready for DC to . RN, , patient's family, and facility notified of DC. Discharge Summary sent to facility. RN given number for report. DC packet on chart. Ambulance transport requested for patient.   MD to come to unit sign script for controlled substance  Elvera Bicker dialysis liaison notified of discharge   [employer] signing off.  Isaias Cowman [facility] [phone]        Patient Goals and CMS Choice Patient states their goals for this hospitalization and ongoing recovery are:: to go home CMS Medicare.gov Compare Post Acute Care list provided to:: Patient Choice offered to / list presented to : Patient  Discharge Placement                       Discharge Plan and Services                                     Social Determinants of Health (SDOH) Interventions     Readmission Risk Interventions    10/25/2019    8:43 AM 10/24/2019   10:14 AM  Readmission Risk Prevention Plan  Transportation Screening Complete   PCP or Specialist Appt within 3-5 Days Not Complete   HRI or Home Care Consult Complete Complete  Palliative Care Screening Not Applicable   Medication Review (RN Care Manager) Complete

## 2022-05-16 NOTE — Progress Notes (Signed)
Patient [facility]  ROUNDING NOTE   Subjective:   Cynthia Dean is a 71 y.o. 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 Patient presents to ED with complaints of right leg pain and weakness. She has been admitted for Right leg pain [M79.604] ESRD on dialysis ([place]) [N18.6, Z99.2] Generalized weakness [R53.1] Unable to ambulate [R26.2] Diarrhea, unspecified type [R19.7]  Patient is known to our practice and receives outpatient dialysis treatments at [facility] on a MWF schedule,    Patient seen and evaluated during dialysis   HEMODIALYSIS FLOWSHEET:  Blood Flow Rate (mL/min): 400 mL/min Arterial Pressure (mmHg): -180 mmHg Venous Pressure (mmHg): 210 mmHg TMP (mmHg): 5 mmHg Ultrafiltration Rate (mL/min): 518 mL/min Dialysate Flow Rate (mL/min): 300 ml/min Dialysis Fluid Bolus: Normal Saline Bolus Amount (mL): 300 mL  Reports some pain in the right leg and back but it is tolerable.   Objective:  Vital signs in last 24 hours:  Temp:  [97.8 F (36.6 C)-98.4 F (36.9 C)] 98.1 F (36.7 C) (10/23 0855) Pulse Rate:  [56-65] 63 (10/23 1000) Resp:  [14-20] 15 (10/23 1000) BP: (107-146)/(52-68) 107/52 (10/23 1000) SpO2:  [95 %-100 %] 98 % (10/23 1000) Weight:  [111 kg-111.3 kg] 111 kg (10/23 0859)  Weight change:  Filed Weights   05/14/22 0500 05/16/22 0417 05/16/22 0859  Weight: 109 kg 111.3 kg 111 kg    Intake/Output: I/O last 3 completed shifts: In: 80 [P.O.:780] Out: -    Intake/Output this shift:  Total I/O In: 120 [P.O.:120] Out: -   Physical Exam: General: NAD  Head: Normocephalic, atraumatic. Moist oral mucosal membranes  Eyes: Anicteric  Lungs:  Diminished in bases, normal effort , [place] O2  Heart: Regular rate and rhythm  Abdomen:  Soft, nontender, obese  Extremities:  No peripheral edema.   Neurologic: Nonfocal, moving all four extremities  Skin: No lesions  Access: Lt upper AVG    Basic Metabolic Panel: Recent Labs  Lab 05/11/22 0940 05/12/22 0652 05/13/22 0439 05/14/22 0458 05/15/22 0443 05/16/22 0455  NA 137 139 134* 136 134* 136  K 4.6 4.2 5.4* 4.8 4.3 5.2*  CL 100 99 94* 96* 93* 96*  CO2 '25 28 28 28 27 26  '$ GLUCOSE 131* 153* 191* 210* 171* 186*  BUN 50* 35* 55* 43* 67* 83*  CREATININE 8.81* 6.45* 8.11* 6.05* 7.51* 9.78*  CALCIUM 8.6* 8.4* 8.7* 9.1 8.3* 8.6*  MG  --  1.8 2.1 1.9 1.9 2.0  PHOS 6.5*  --   --   --   --   --      Liver Function Tests: Recent Labs  Lab 05/11/22 0940  ALBUMIN 2.7*    No results for input(s): "LIPASE", "AMYLASE" in the last 168 hours. No results for input(s): "AMMONIA" in the last 168 hours.  CBC: Recent Labs  Lab 05/11/22 0940 05/12/22 0652 05/13/22 0439 05/14/22 0458 05/15/22 0443 05/16/22 0455  WBC 8.7 9.0 8.8 10.1 10.9* 10.0  NEUTROABS 6.0  --   --   --   --   --   HGB 8.0* 8.0* 9.0* 8.4* 8.2* 8.3*  HCT 24.8* 25.3* 28.3* 26.6* 26.3* 27.0*  MCV 85.8 86.6 86.8 86.4 86.5 86.3  PLT 221 208 253 276 275 293     Cardiac Enzymes: No results for input(s): "CKTOTAL", "CKMB", "CKMBINDEX", "TROPONINI" in the last 168 hours.   BNP: Invalid input(s): "POCBNP"  CBG: Recent Labs  Lab 05/15/22 0857 05/15/22 1140 05/15/22 1613 05/15/22 2142 05/16/22 0817  GLUCAP 148* 180* 221* 237* 154*     Microbiology: Results for orders placed or performed during the hospital encounter of 03/19/22  Resp Panel by RT-PCR (Flu A&B, Covid) Anterior Nasal Swab     Status: None   Collection Time: 03/19/22  9:28 PM   Specimen: Anterior Nasal Swab  Result Value Ref Range Status   SARS Coronavirus 2 by RT PCR NEGATIVE NEGATIVE Final    Comment: (NOTE) SARS-CoV-2 target nucleic acids are NOT DETECTED.  The SARS-CoV-2 RNA is generally detectable in upper respiratory specimens during the acute phase of infection. The  lowest concentration of SARS-CoV-2 viral copies this assay can detect is 138 copies/mL. A negative result does not preclude SARS-Cov-2 infection and should not be used as the sole basis for treatment or other patient management decisions. A negative result may occur with  improper specimen collection/handling, submission of specimen other than nasopharyngeal swab, presence of viral mutation(s) within the areas targeted by this assay, and inadequate number of viral copies(<138 copies/mL). A negative result must be combined with clinical observations, patient history, and epidemiological information. The expected result is Negative.  Fact Sheet for Patients:  EntrepreneurPulse.com.au  Fact Sheet for Healthcare Providers:  IncredibleEmployment.be  This test is no t yet approved or cleared by the [place] FDA and  has been authorized for detection and/or diagnosis of SARS-CoV-2 by FDA under an Emergency Use Authorization (EUA). This EUA will remain  in effect (meaning this test can be used) for the duration of the COVID-19 declaration under Section 564(b)(1) of the Act, 21 U.S.C.section 360bbb-3(b)(1), unless the authorization is terminated  or revoked sooner.       Influenza A by PCR NEGATIVE NEGATIVE Final   Influenza B by PCR NEGATIVE NEGATIVE Final    Comment: (NOTE) The Xpert Xpress SARS-CoV-2/FLU/RSV plus assay is intended as an aid in the diagnosis of influenza from Nasopharyngeal swab specimens and should not be used as a sole basis for treatment. Nasal washings and aspirates are unacceptable for Xpert Xpress SARS-CoV-2/FLU/RSV testing.  Fact Sheet for Patients: EntrepreneurPulse.com.au  Fact Sheet for Healthcare Providers: IncredibleEmployment.be  This test is not yet approved or cleared by the [place] FDA and has been authorized for detection and/or diagnosis of SARS-CoV-2 by FDA under  an Emergency Use Authorization (EUA). This EUA will remain in effect (meaning this test can be used) for the duration of the COVID-19 declaration under Section 564(b)(1) of the Act, 21 U.S.C. section 360bbb-3(b)(1), unless the authorization is terminated or revoked.  Performed at [facility], [address] [place], [place] [address]   Blood Culture (routine x 2)     Status: Abnormal   Collection Time: 03/19/22  9:28 PM   Specimen: BLOOD  Result Value Ref Range Status   Specimen Description   Final    BLOOD RIGHT ANTECUBITAL Performed at [facility], [address], [place], [place] [address]    Special Requests   Final    BOTTLES DRAWN AEROBIC AND ANAEROBIC Blood Culture adequate volume Performed at [facility], [address] [place], [place] [address]    Culture  Setup Time   Final    GRAM POSITIVE COCCI IN BOTH AEROBIC AND ANAEROBIC BOTTLES CRITICAL RESULT CALLED TO, READ BACK BY AND VERIFIED WITH: CAROLYN CHILDS 03/20/22 1457 AMK Performed at [facility], [address] [place], [place] [address]  Culture STAPHYLOCOCCUS CAPITIS (A)  Final   Report Status 03/23/2022 FINAL  Final   Organism ID, Bacteria STAPHYLOCOCCUS CAPITIS  Final      Susceptibility   Staphylococcus capitis - MIC*    CIPROFLOXACIN <=0.5 SENSITIVE Sensitive     ERYTHROMYCIN <=0.25 SENSITIVE Sensitive     GENTAMICIN <=0.5 SENSITIVE Sensitive     OXACILLIN <=0.25 SENSITIVE Sensitive     TETRACYCLINE <=1 SENSITIVE Sensitive     VANCOMYCIN <=0.5 SENSITIVE Sensitive     TRIMETH/SULFA <=10 SENSITIVE Sensitive     CLINDAMYCIN <=0.25 SENSITIVE Sensitive     RIFAMPIN <=0.5 SENSITIVE Sensitive     Inducible Clindamycin NEGATIVE Sensitive     * STAPHYLOCOCCUS CAPITIS  Blood Culture ID Panel (Reflexed)     Status: Abnormal   Collection Time: 03/19/22  9:28 PM  Result Value Ref Range Status   Enterococcus faecalis NOT DETECTED NOT DETECTED Final   Enterococcus  Faecium NOT DETECTED NOT DETECTED Final   Listeria monocytogenes NOT DETECTED NOT DETECTED Final   Staphylococcus species DETECTED (A) NOT DETECTED Final    Comment: CRITICAL RESULT CALLED TO, READ BACK BY AND VERIFIED WITH: CAROLYN CHILDS 03/20/22 1457 AMK    Staphylococcus aureus (BCID) NOT DETECTED NOT DETECTED Final   Staphylococcus epidermidis NOT DETECTED NOT DETECTED Final   Staphylococcus lugdunensis NOT DETECTED NOT DETECTED Final   Streptococcus species NOT DETECTED NOT DETECTED Final   Streptococcus agalactiae NOT DETECTED NOT DETECTED Final   Streptococcus pneumoniae NOT DETECTED NOT DETECTED Final   Streptococcus pyogenes NOT DETECTED NOT DETECTED Final   A.calcoaceticus-baumannii NOT DETECTED NOT DETECTED Final   Bacteroides fragilis NOT DETECTED NOT DETECTED Final   Enterobacterales NOT DETECTED NOT DETECTED Final   Enterobacter cloacae complex NOT DETECTED NOT DETECTED Final   Escherichia coli NOT DETECTED NOT DETECTED Final   Klebsiella aerogenes NOT DETECTED NOT DETECTED Final   Klebsiella oxytoca NOT DETECTED NOT DETECTED Final   Klebsiella pneumoniae NOT DETECTED NOT DETECTED Final   Proteus species NOT DETECTED NOT DETECTED Final   Salmonella species NOT DETECTED NOT DETECTED Final   Serratia marcescens NOT DETECTED NOT DETECTED Final   Haemophilus influenzae NOT DETECTED NOT DETECTED Final   Neisseria meningitidis NOT DETECTED NOT DETECTED Final   Pseudomonas aeruginosa NOT DETECTED NOT DETECTED Final   Stenotrophomonas maltophilia NOT DETECTED NOT DETECTED Final   Candida albicans NOT DETECTED NOT DETECTED Final   Candida auris NOT DETECTED NOT DETECTED Final   Candida glabrata NOT DETECTED NOT DETECTED Final   Candida krusei NOT DETECTED NOT DETECTED Final   Candida parapsilosis NOT DETECTED NOT DETECTED Final   Candida tropicalis NOT DETECTED NOT DETECTED Final   Cryptococcus neoformans/gattii NOT DETECTED NOT DETECTED Final    Comment: Performed at [facility], [address] [place], [place] [address]  Blood Culture (routine x 2)     Status: Abnormal   Collection Time: 03/19/22 10:23 PM   Specimen: BLOOD  Result Value Ref Range Status   Specimen Description   Final    BLOOD BLOOD RIGHT FOREARM Performed at [facility], [address] [place], [place] [address]    Special Requests   Final    BOTTLES DRAWN AEROBIC AND ANAEROBIC Blood Culture adequate volume Performed at [facility], [address] [place], [place] [address]    Culture  Setup Time   Final    GRAM POSITIVE COCCI IN BOTH AEROBIC AND ANAEROBIC BOTTLES CRITICAL RESULT CALLED TO, READ BACK BY AND VERIFIED  WITH: ELVERA MADUEME 03/20/22 1545 AMK Performed at [facility], [place], [place] [address]    Culture (A)  Final    STAPHYLOCOCCUS CAPITIS SUSCEPTIBILITIES PERFORMED ON PREVIOUS CULTURE WITHIN THE LAST 5 DAYS. Performed at [facility], [place] [address] [place], [place] [address]    Report Status 03/22/2022 FINAL  Final  Urine Culture     Status: Abnormal   Collection Time: 03/20/22  1:25 AM   Specimen: In/Out Cath Urine  Result Value Ref Range Status   Specimen Description   Final    IN/OUT CATH URINE Performed at [facility], [address] [place], [place] [address]    Special Requests   Final    NONE Performed at [facility], [address] [place], [place] [address]    Culture 4,000 COLONIES/mL STAPHYLOCOCCUS EPIDERMIDIS (A)  Final   Report Status 03/22/2022 FINAL  Final   Organism ID, Bacteria STAPHYLOCOCCUS EPIDERMIDIS (A)  Final      Susceptibility   Staphylococcus epidermidis - MIC*    CIPROFLOXACIN <=0.5 SENSITIVE Sensitive     GENTAMICIN <=0.5 SENSITIVE Sensitive     NITROFURANTOIN <=16 SENSITIVE Sensitive     OXACILLIN <=0.25 SENSITIVE Sensitive     TETRACYCLINE <=1 SENSITIVE Sensitive     VANCOMYCIN 2 SENSITIVE Sensitive     TRIMETH/SULFA 80 RESISTANT  Resistant     CLINDAMYCIN >=8 RESISTANT Resistant     RIFAMPIN <=0.5 SENSITIVE Sensitive     Inducible Clindamycin NEGATIVE Sensitive     * 4,000 COLONIES/mL STAPHYLOCOCCUS EPIDERMIDIS  Culture, blood (Routine X 2) w Reflex to ID Panel     Status: None   Collection Time: 03/21/22 11:00 AM   Specimen: BLOOD  Result Value Ref Range Status   Specimen Description BLOOD BLOOD RIGHT FOREARM  Final   Special Requests   Final    BOTTLES DRAWN AEROBIC AND ANAEROBIC Blood Culture adequate volume   Culture   Final    NO GROWTH 5 DAYS Performed at [facility], [address] [place], [place] [address]    Report Status 03/26/2022 FINAL  Final  Culture, blood (Routine X 2) w Reflex to ID Panel     Status: None   Collection Time: 03/22/22 11:53 AM   Specimen: BLOOD RIGHT HAND  Result Value Ref Range Status   Specimen Description BLOOD RIGHT HAND  Final   Special Requests   Final    BOTTLES DRAWN AEROBIC AND ANAEROBIC Blood Culture adequate volume   Culture   Final    NO GROWTH 5 DAYS Performed at [facility], [address] [place], [place] [address]    Report Status 03/27/2022 FINAL  Final  Aerobic/Anaerobic Culture w Gram Stain (surgical/deep wound)     Status: None   Collection Time: 03/25/22 10:30 AM   Specimen: Wound; Blood  Result Value Ref Range Status   Specimen Description WOUND LEFT BREAST  Final   Special Requests BLOOD HEMATOMA  Final   Gram Stain   Final    RARE WBC PRESENT, PREDOMINANTLY MONONUCLEAR NO ORGANISMS SEEN    Culture   Final    No growth aerobically or anaerobically. Performed at [facility], [place] [address] [place], [place] [address]    Report Status 03/30/2022 FINAL  Final    Coagulation Studies: No results for input(s): "LABPROT", "INR" in the last 72 hours.  Urinalysis: No results for input(s): "COLORURINE", "LABSPEC", "PHURINE", "GLUCOSEU", "HGBUR", "BILIRUBINUR", "KETONESUR", "PROTEINUR", "UROBILINOGEN", "  NITRITE",  "LEUKOCYTESUR" in the last 72 hours.  Invalid input(s): "APPERANCEUR"    Imaging: No results found.   Medications:    anticoagulant sodium citrate      apixaban  5 mg Oral BID   aspirin EC  81 mg Oral Daily   Chlorhexidine Gluconate Cloth  6 each Topical Q0600   vitamin D3  1,000 Units Oral Daily   cinacalcet  30 mg Oral Q breakfast   gabapentin  300 mg Oral BID   insulin aspart  0-5 Units Subcutaneous QHS   insulin aspart  0-9 Units Subcutaneous TID WC   levETIRAcetam  1,000 mg Oral Daily   levETIRAcetam  250 mg Oral Q M,W,F-1800   lidocaine  1 patch Transdermal Q24H   losartan  100 mg Oral QHS   methylPREDNISolone  8 mg Oral Daily   Followed by   Derrill Memo ON 05/17/2022] methylPREDNISolone  4 mg Oral Daily   metoprolol succinate  100 mg Oral Daily   pantoprazole  40 mg Oral Daily   sertraline  25 mg Oral Daily   torsemide  100 mg Oral Daily   traZODone  25 mg Oral QHS   acetaminophen, alteplase, anticoagulant sodium citrate, diphenoxylate-atropine, fentaNYL (SUBLIMAZE) injection, fluticasone, guaiFENesin, heparin, hydrALAZINE, HYDROmorphone, ipratropium-albuterol, lidocaine (PF), lidocaine-prilocaine, LORazepam, metoprolol tartrate, ondansetron (ZOFRAN) IV, pentafluoroprop-tetrafluoroeth, senna-docusate, traZODone  Assessment/ Plan:  Cynthia Dean is a 71 y.o.  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 Patient presents to ED with complaints of right leg pain and weakness. She has been admitted for Right leg pain [M79.604] ESRD on dialysis ([place]) [N18.6, Z99.2] Generalized weakness [R53.1] Unable to ambulate [R26.2] Diarrhea, unspecified type [R19.7]  [facility]/MWF/Lt AVG  End stage renal disease on hemodialysis.        Patient is a Monday Wednesday Friday schedule       Receiving dialysis today, UF goal as tolerated.  Next treatment scheduled for Wednesday.  Patient will need to sit in the chair for dialysis prior to discharge.   2. Anemia of chronic kidney disease, Normocytic Lab Results  Component Value Date   HGB 8.3 (L) 05/16/2022    Hgb just within target.  Patient receives [place] outpatient.   3. Secondary Hyperparathyroidism: with outpatient labs: PTH 613, phosphorus 4.8, calcium 9.0 on 03/07/22.    Lab Results  Component Value Date   PTH 131 (H) 03/14/2018   CALCIUM 8.6 (L) 05/16/2022   PHOS 6.5 (H) 05/11/2022    Phosphorus elevated, will monitor and assess need for binders.   4. Hypertension with chronic kidney disease. Home regimen includes losartan, metoprolol, and torsemide. Currently receiving these medications.   5. Diabetes mellitus type II with chronic kidney disease: insulin dependent.. Well controlled.  6.Inability to ambulate secondary to right leg pain. Primary team , ortho and Neurosurgery is following. OT recommending SNF for short term rehab.    LOS: [place] 10/23/202310:37 AM

## 2022-05-16 NOTE — Progress Notes (Signed)
PT Cancellation Note  Patient Details Name: Cynthia Dean MRN: [id number] DOB: 1950/11/18   Cancelled Treatment:    Reason Eval/Treat Not Completed: Patient at procedure or test/unavailable.  Pt currently off floor at dialysis.  Will re-attempt PT session at a later date/time.  Leitha Bleak, PT 05/16/22, 11:14 AM

## 2022-05-16 NOTE — Plan of Care (Signed)
IV removed, report called to [facility], patient will be going to room 807.  Waiting on EMS to transport

## 2022-05-16 NOTE — Inpatient Diabetes Management (Signed)
Inpatient Diabetes Program Recommendations  AACE/ADA: New Consensus Statement on Inpatient Glycemic Control   Target Ranges:  Prepandial:   less than 140 mg/dL      [facility] postprandial:   less than 180 mg/dL (1-2 hours)      Critically ill patients:  140 - 180 mg/dL    Latest Reference Range & Units 05/15/22 08:57 05/15/22 11:40 05/15/22 16:13 05/15/22 21:42 05/16/22 08:17  Glucose-Capillary 70 - 99 mg/dL 148 (H) 180 (H) 221 (H) 237 (H) 154 (H)   Review of Glycemic Control  Diabetes history: DM2 Outpatient Diabetes medications: 70/30 28 units BID Current orders for Inpatient glycemic control: Novolog 0-9 units TID with meals, Novolog 0-5 units QHS; Medrol 8 mg daily (tapering)  Inpatient Diabetes Program Recommendations:    Insulin: If steroids are continued and if post prandial glucose remains consistently over 180 mg/dl, please consider ordering Novolog 3 units TID with meals for meal coverage if patient eats at least 50% of meals.  Thanks, Barnie Alderman, RN, MSN, [place] Diabetes Coordinator Inpatient Diabetes Program [phone] (Team Pager from 8am to [place] )

## 2022-06-27 ENCOUNTER — Emergency Department: Payer: Medicare Other

## 2022-06-27 ENCOUNTER — Emergency Department
Admission: EM | Admit: 2022-06-27 | Discharge: 2022-07-01 | Disposition: A | Discharge status: Home / Self Care | Payer: Medicare Other | Attending: Emergency Medicine | Admitting: Emergency Medicine

## 2022-06-27 ENCOUNTER — Other Ambulatory Visit: Payer: Self-pay

## 2022-06-27 ENCOUNTER — Encounter: Payer: Self-pay | Admitting: Emergency Medicine

## 2022-06-27 DIAGNOSIS — J449 Chronic obstructive pulmonary disease, unspecified: Secondary | ICD-10-CM | POA: Insufficient documentation

## 2022-06-27 DIAGNOSIS — I5030 Unspecified diastolic (congestive) heart failure: Secondary | ICD-10-CM | POA: Insufficient documentation

## 2022-06-27 DIAGNOSIS — E1122 Type 2 diabetes mellitus with diabetic chronic kidney disease: Secondary | ICD-10-CM | POA: Diagnosis not present

## 2022-06-27 DIAGNOSIS — Z86711 Personal history of pulmonary embolism: Secondary | ICD-10-CM | POA: Insufficient documentation

## 2022-06-27 DIAGNOSIS — R1084 Generalized abdominal pain: Secondary | ICD-10-CM | POA: Insufficient documentation

## 2022-06-27 DIAGNOSIS — D649 Anemia, unspecified: Secondary | ICD-10-CM | POA: Insufficient documentation

## 2022-06-27 DIAGNOSIS — Z992 Dependence on renal dialysis: Secondary | ICD-10-CM | POA: Insufficient documentation

## 2022-06-27 DIAGNOSIS — R110 Nausea: Secondary | ICD-10-CM | POA: Insufficient documentation

## 2022-06-27 DIAGNOSIS — Z1152 Encounter for screening for COVID-19: Secondary | ICD-10-CM | POA: Diagnosis not present

## 2022-06-27 DIAGNOSIS — I132 Hypertensive heart and chronic kidney disease with heart failure and with stage 5 chronic kidney disease, or end stage renal disease: Secondary | ICD-10-CM | POA: Insufficient documentation

## 2022-06-27 DIAGNOSIS — N186 End stage renal disease: Secondary | ICD-10-CM | POA: Diagnosis not present

## 2022-06-27 DIAGNOSIS — I2510 Atherosclerotic heart disease of native coronary artery without angina pectoris: Secondary | ICD-10-CM | POA: Diagnosis not present

## 2022-06-27 DIAGNOSIS — R1032 Left lower quadrant pain: Secondary | ICD-10-CM | POA: Diagnosis present

## 2022-06-27 LAB — CBC
HCT: 25.3 % — ABNORMAL LOW (ref 36.0–46.0)
Hemoglobin: 7.6 g/dL — ABNORMAL LOW (ref 12.0–15.0)
MCH: 26.2 pg (ref 26.0–34.0)
MCHC: 30 g/dL (ref 30.0–36.0)
MCV: 87.2 fL (ref 80.0–100.0)
Platelets: 188 10*3/uL (ref 150–400)
RBC: 2.9 MIL/uL — ABNORMAL LOW (ref 3.87–5.11)
RDW: 16.2 % — ABNORMAL HIGH (ref 11.5–15.5)
WBC: 11 10*3/uL — ABNORMAL HIGH (ref 4.0–10.5)
nRBC: 0 % (ref 0.0–0.2)

## 2022-06-27 LAB — RESP PANEL BY RT-PCR (FLU A&B, COVID) ARPGX2
Influenza A by PCR: NEGATIVE
Influenza B by PCR: NEGATIVE
SARS Coronavirus 2 by RT PCR: NEGATIVE

## 2022-06-27 LAB — LIPASE, BLOOD: Lipase: 56 U/L — ABNORMAL HIGH (ref 11–51)

## 2022-06-27 LAB — CBG MONITORING, ED
Glucose-Capillary: 127 mg/dL — ABNORMAL HIGH (ref 70–99)
Glucose-Capillary: 126 mg/dL — ABNORMAL HIGH (ref 70–99)

## 2022-06-27 LAB — COMPREHENSIVE METABOLIC PANEL
ALT: 11 U/L (ref 0–44)
AST: 12 U/L — ABNORMAL LOW (ref 15–41)
Albumin: 3.4 g/dL — ABNORMAL LOW (ref 3.5–5.0)
Alkaline Phosphatase: 107 U/L (ref 38–126)
Anion gap: 16 — ABNORMAL HIGH (ref 5–15)
BUN: 57 mg/dL — ABNORMAL HIGH (ref 8–23)
CO2: 22 mmol/L (ref 22–32)
Calcium: 8.3 mg/dL — ABNORMAL LOW (ref 8.9–10.3)
Chloride: 101 mmol/L (ref 98–111)
Creatinine, Ser: 9.45 mg/dL — ABNORMAL HIGH (ref 0.44–1.00)
GFR, Estimated: 4 mL/min — ABNORMAL LOW (ref >60)
Glucose, Bld: 174 mg/dL — ABNORMAL HIGH (ref 70–99)
Potassium: 4.6 mmol/L (ref 3.5–5.1)
Sodium: 139 mmol/L (ref 135–145)
Total Bilirubin: 0.7 mg/dL (ref 0.3–1.2)
Total Protein: 7.4 g/dL (ref 6.5–8.1)

## 2022-06-27 MED ORDER — GABAPENTIN 300 MG PO CAPS
300.0000 mg | ORAL_CAPSULE | Freq: Two times a day (BID) | ORAL | Status: DC
  Administered 2022-06-27 – 2022-07-01 (×7): 300 mg via ORAL
  Filled 2022-06-27 (×8): qty 1

## 2022-06-27 MED ORDER — ASPIRIN 81 MG PO TBEC
81.0000 mg | DELAYED_RELEASE_TABLET | Freq: Every day | ORAL | Status: DC
  Administered 2022-06-27 – 2022-07-01 (×5): 81 mg via ORAL
  Filled 2022-06-27 (×5): qty 1

## 2022-06-27 MED ORDER — MORPHINE SULFATE (PF) 2 MG/ML IV SOLN
2.0000 mg | Freq: Once | INTRAVENOUS | Status: AC
  Administered 2022-06-27: 2 mg via INTRAVENOUS
  Filled 2022-06-27: qty 1

## 2022-06-27 MED ORDER — IPRATROPIUM-ALBUTEROL 0.5-2.5 (3) MG/3ML IN SOLN: 3.0000 mL | Freq: Four times a day (QID) | RESPIRATORY_TRACT | Status: DC | PRN

## 2022-06-27 MED ORDER — CINACALCET HCL 30 MG PO TABS
30.0000 mg | ORAL_TABLET | Freq: Every day | ORAL | Status: DC
  Administered 2022-06-29 – 2022-07-01 (×3): 30 mg via ORAL
  Filled 2022-06-27 (×4): qty 1

## 2022-06-27 MED ORDER — METOPROLOL SUCCINATE ER 50 MG PO TB24
100.0000 mg | ORAL_TABLET | Freq: Every day | ORAL | Status: DC
  Administered 2022-06-27 – 2022-07-01 (×4): 100 mg via ORAL
  Filled 2022-06-27 (×4): qty 2

## 2022-06-27 MED ORDER — CALCIUM ACETATE (PHOS BINDER) 667 MG PO CAPS
2001.0000 mg | ORAL_CAPSULE | Freq: Three times a day (TID) | ORAL | Status: DC
  Administered 2022-06-28 – 2022-07-01 (×8): 2001 mg via ORAL
  Filled 2022-06-27 (×12): qty 3

## 2022-06-27 MED ORDER — IOHEXOL 300 MG/ML  SOLN
100.0000 mL | Freq: Once | INTRAMUSCULAR | Status: AC | PRN
  Administered 2022-06-27: 100 mL via INTRAVENOUS

## 2022-06-27 MED ORDER — ACETAMINOPHEN 500 MG PO TABS: 500.0000 mg | ORAL_TABLET | Freq: Four times a day (QID) | ORAL | Status: DC | PRN

## 2022-06-27 MED ORDER — TORSEMIDE 20 MG PO TABS
100.0000 mg | ORAL_TABLET | Freq: Every day | ORAL | Status: DC
  Administered 2022-06-27 – 2022-07-01 (×5): 100 mg via ORAL
  Filled 2022-06-27 (×5): qty 5

## 2022-06-27 MED ORDER — APIXABAN 5 MG PO TABS
5.0000 mg | ORAL_TABLET | Freq: Two times a day (BID) | ORAL | Status: DC
  Administered 2022-06-27 – 2022-07-01 (×8): 5 mg via ORAL
  Filled 2022-06-27 (×8): qty 1

## 2022-06-27 MED ORDER — ATORVASTATIN CALCIUM 20 MG PO TABS
80.0000 mg | ORAL_TABLET | Freq: Every evening | ORAL | Status: DC
  Administered 2022-06-27 – 2022-07-01 (×5): 80 mg via ORAL
  Filled 2022-06-27 (×5): qty 4

## 2022-06-27 MED ORDER — LOSARTAN POTASSIUM 50 MG PO TABS
100.0000 mg | ORAL_TABLET | Freq: Every day | ORAL | Status: DC
  Administered 2022-06-27 – 2022-06-30 (×4): 100 mg via ORAL
  Filled 2022-06-27 (×4): qty 2

## 2022-06-27 MED ORDER — PANTOPRAZOLE SODIUM 40 MG PO TBEC
40.0000 mg | DELAYED_RELEASE_TABLET | Freq: Every day | ORAL | Status: DC
  Administered 2022-06-27 – 2022-07-01 (×5): 40 mg via ORAL
  Filled 2022-06-27 (×5): qty 1

## 2022-06-27 MED ORDER — ONDANSETRON HCL 4 MG/2ML IJ SOLN
4.0000 mg | Freq: Once | INTRAMUSCULAR | Status: AC
  Administered 2022-06-27: 4 mg via INTRAVENOUS
  Filled 2022-06-27: qty 2

## 2022-06-27 MED ORDER — LEVETIRACETAM 500 MG PO TABS
1000.0000 mg | ORAL_TABLET | Freq: Every day | ORAL | Status: DC
  Administered 2022-06-29 – 2022-07-01 (×3): 1000 mg via ORAL
  Filled 2022-06-27 (×4): qty 2

## 2022-06-27 MED ORDER — SERTRALINE HCL 50 MG PO TABS
25.0000 mg | ORAL_TABLET | Freq: Every day | ORAL | Status: DC
  Administered 2022-06-27 – 2022-07-01 (×5): 25 mg via ORAL
  Filled 2022-06-27 (×5): qty 1

## 2022-06-27 MED ORDER — CHLORHEXIDINE GLUCONATE CLOTH 2 % EX PADS
6.0000 | MEDICATED_PAD | Freq: Every day | CUTANEOUS | Status: DC
  Filled 2022-06-27 (×4): qty 6

## 2022-06-27 MED ORDER — INSULIN ASPART 100 UNIT/ML IJ SOLN
2.0000 [IU] | Freq: Three times a day (TID) | INTRAMUSCULAR | Status: DC
  Administered 2022-06-28 – 2022-07-01 (×6): 2 [IU] via SUBCUTANEOUS
  Filled 2022-06-27 (×5): qty 1

## 2022-06-27 MED ORDER — INSULIN ASPART 100 UNIT/ML IJ SOLN
0.0000 [IU] | Freq: Three times a day (TID) | INTRAMUSCULAR | Status: DC
  Administered 2022-06-28: 1 [IU] via SUBCUTANEOUS
  Filled 2022-06-27 (×2): qty 1

## 2022-06-27 MED ORDER — TRAZODONE HCL 50 MG PO TABS
50.0000 mg | ORAL_TABLET | Freq: Every day | ORAL | Status: DC
  Administered 2022-06-27 – 2022-06-30 (×3): 50 mg via ORAL
  Filled 2022-06-27 (×4): qty 1

## 2022-06-27 MED ORDER — LEVETIRACETAM 250 MG PO TABS
250.0000 mg | ORAL_TABLET | ORAL | Status: DC
  Administered 2022-07-01: 250 mg via ORAL
  Filled 2022-06-27 (×2): qty 1

## 2022-06-27 MED ORDER — VITAMIN D 25 MCG (1000 UNIT) PO TABS
1000.0000 [IU] | ORAL_TABLET | Freq: Every day | ORAL | Status: DC
  Administered 2022-06-27 – 2022-07-01 (×5): 1000 [IU] via ORAL
  Filled 2022-06-27 (×5): qty 1

## 2022-06-27 MED ORDER — FLUTICASONE PROPIONATE 50 MCG/ACT NA SUSP
1.0000 | Freq: Every day | NASAL | Status: DC
  Filled 2022-06-27 (×3): qty 16

## 2022-06-27 NOTE — ED Provider Notes (Addendum)
This is a patient who was signed out to me pending CT scan of her abdomen pelvis for abdominal pain.  He is a hemodialysis patient Monday Wednesday and Friday and missed her dialysis session today to come to the emergency department for evaluation.  When I evaluate her she states that her abdominal pain is much improved and fortunately her CT scan shows no emergent pathologies within the abdomen.  Her labs were reviewed and it appears she does not require emergent dialysis at this time, she does not appear fluid overloaded and has no respiratory distress.  I spoke with dialysis coordinators to arrange for dialysis tomorrow as a make-up session.  However, the patient and her family state that her mobility at home is very poor and she [facility] but has been unable to get around her house and has no [place] lift or assistance.  Her family is having much difficulty getting her out of bed to perform activities of daily living due to her very poor mobility.  She would like to be reassessed for physical therapy/rehab placement.  She was placed in transitions of care.  I confirmed with dialysis coordinator Colon Flattery that patient will have dialysis while in ED tomorrow (12/5) morning while awaiting TOC placement.    Lucillie Garfinkel, MD 06/27/22 1707    Lucillie Garfinkel, MD 06/27/22 2029

## 2022-06-27 NOTE — ED Notes (Signed)
Bed alarm turned on for patient.

## 2022-06-27 NOTE — ED Triage Notes (Signed)
Pt to ED via [facility] from home with c/o generalized abd pain. Pt states generalized abd pain that wraps around her belly. Pt denies N/V. Pt also states pain worse when she bends over. Pt also with new onset immobility that started at approx 0300 today. Pt is a MWF dialysis patient and needs dialysis today.

## 2022-06-27 NOTE — ED Notes (Signed)
Dr. Jacelyn Grip notified of patient's new confusion.

## 2022-06-27 NOTE — ED Provider Triage Note (Signed)
Emergency Medicine Provider Triage Evaluation Note  Cynthia Dean , a 71 y.o. female  was evaluated in triage.  Pt complains of generalized weakness and abdominal pain, worse when bending over.  ESRD on HD M/W/F.  Review of Systems  Positive: Abdominal pain Negative: Chest pain  Physical Exam  BP (!) 164/72 (BP Location: Right Arm)   Pulse 68   Temp 98.6 F (37 C) (Oral)   Resp 20   Ht '5\' 9"'$  (1.753 m)   Wt 109.7 kg   SpO2 100%   BMI 35.71 kg/m  Gen:   Awake, no distress   Resp:  Normal effort  MSK:   Moves extremities without difficulty  Other:    Medical Decision Making  Medically screening exam initiated at 6:24 AM.  Appropriate orders placed.  Cynthia Dean was informed that the remainder of the evaluation will be completed by another provider, this initial triage assessment does not replace that evaluation, and the importance of remaining in the ED until their evaluation is complete.  71 year old dialysis patient presenting with generalized weakness and abdominal pain.  Will start with lab work, UA.   Paulette Blanch, MD 06/27/22 [phone]

## 2022-06-27 NOTE — ED Provider Notes (Signed)
[facility] Provider Note    Event Date/Time   First MD Initiated Contact with Patient 06/27/22 1235     (approximate)   History   Chief Complaint Abdominal Pain   HPI  Cynthia Dean is a 71 y.o. female with past medical history of hypertension, hyperlipidemia, diabetes, CAD, PE on Eliquis, diastolic CHF, ESRD on HD (MWF), stroke, seizure, and anemia who presents to the ED complaining of abdominal pain.  Patient reports that she has had 2 days of constant pain primarily in the left lower quadrant of her abdomen.  She describes it as a dull ache that is not exacerbated or alleviated by anything in particular.  She has felt nauseous with this pain and has had decreased oral intake, but denies any vomiting.  She reports chronic diarrhea that has been worse since the onset of pain.  She denies any fevers and has not had any cough, chest pain, or shortness of breath.  She still makes urine but denies any dysuria.  She was scheduled for dialysis earlier today but was unable to go due to her symptoms, last had a run of dialysis 3 days ago.     Physical Exam   Triage Vital Signs: ED Triage Vitals  Enc Vitals Group     BP 06/27/22 0612 (!) 164/72     Pulse Rate 06/27/22 0612 68     Resp 06/27/22 0612 20     Temp 06/27/22 0612 98.6 F (37 C)     Temp Source 06/27/22 0612 Oral     SpO2 06/27/22 0612 100 %     Weight 06/27/22 0610 241 lb 13.5 oz (109.7 kg)     Height 06/27/22 0610 '5\' 9"'$  (1.753 m)     Head Circumference --      Peak Flow --      Pain Score 06/27/22 0610 10     Pain Loc --      Pain Edu? --      Excl. in [place]? --     Most recent vital signs: Vitals:   06/27/22 1500 06/27/22 1722  BP: (!) 137/55 (!) 154/68  Pulse: (!) 58 (!) 55  Resp:  15  Temp:  97.8 F (36.6 C)  SpO2:  98%    Constitutional: Alert and oriented. Eyes: Conjunctivae are normal. Head: Atraumatic. Nose: No congestion/rhinnorhea. Mouth/Throat: Mucous membranes  are moist.  Cardiovascular: Normal rate, regular rhythm. Grossly normal heart sounds.  2+ radial pulses bilaterally.  Left upper extremity AV fistula with palpable thrill. Respiratory: Normal respiratory effort.  No retractions. Lungs CTAB. Gastrointestinal: Soft and tender to palpation in the bilateral lower quadrants, left greater than right. No distention. Musculoskeletal: No lower extremity tenderness nor edema.  Neurologic:  Normal speech and language. No gross focal neurologic deficits are appreciated.    ED Results / Procedures / Treatments   Labs (all labs ordered are listed, but only abnormal results are displayed) Labs Reviewed  LIPASE, BLOOD - Abnormal; Notable for the following components:      Result Value   Lipase 56 (*)    All other components within normal limits  COMPREHENSIVE METABOLIC PANEL - Abnormal; Notable for the following components:   Glucose, Bld 174 (*)    BUN 57 (*)    Creatinine, Ser 9.45 (*)    Calcium 8.3 (*)    Albumin 3.4 (*)    AST 12 (*)    GFR, Estimated 4 (*)    Anion gap 16 (*)  All other components within normal limits  CBC - Abnormal; Notable for the following components:   WBC 11.0 (*)    RBC 2.90 (*)    Hemoglobin 7.6 (*)    HCT 25.3 (*)    RDW 16.2 (*)    All other components within normal limits  RESP PANEL BY RT-PCR (FLU A&B, COVID) ARPGX2  URINALYSIS, ROUTINE W REFLEX MICROSCOPIC  HEPATITIS B E ANTIBODY  HEPATITIS B SURFACE ANTIBODY, QUANTITATIVE  HEPATITIS B SURFACE ANTIGEN  CBG MONITORING, ED  CBG MONITORING, ED   ED ECG REPORT I, Blake Divine, the attending physician, personally viewed and interpreted this ECG.   Date: 06/27/2022  EKG Time: 6:15  Rate: 67  Rhythm: normal sinus rhythm  Axis: Normal  Intervals:none  ST&T Change: None   PROCEDURES:  Critical Care performed: No  Procedures   MEDICATIONS ORDERED IN ED: Medications  Chlorhexidine Gluconate Cloth 2 % PADS 6 each (has no administration in  time range)  morphine (PF) 2 MG/ML injection 2 mg (2 mg Intravenous Given 06/27/22 1412)  ondansetron (ZOFRAN) injection 4 mg (4 mg Intravenous Given 06/27/22 1410)  iohexol (OMNIPAQUE) 300 MG/ML solution 100 mL (100 mLs Intravenous Contrast Given 06/27/22 1535)     IMPRESSION / MDM / ASSESSMENT AND PLAN / ED COURSE  I reviewed the triage vital signs and the nursing notes.                              71 y.o. female with past medical history of hypertension, hyperlipidemia, diabetes, CAD, PE on Eliquis, stroke, seizure, diastolic CHF, and ESRD on HD (MWF) who presents to the ED complaining of 2 days of constant bilateral lower quadrant abdominal pain causing her to miss dialysis earlier today.  Patient's presentation is most consistent with acute presentation with potential threat to life or bodily function.  Differential diagnosis includes, but is not limited to, diverticulitis, appendicitis, UTI, kidney stone, bowel obstruction, electrolyte abnormality.  Patient chronically ill but nontoxic-appearing and in no acute distress, vital signs are unremarkable, EKG shows no evidence of arrhythmia or ischemia.  She has tenderness to palpation in her bilateral lower quadrants, left greater than right.  We will further assess with CT imaging, treat symptomatically with IV morphine and Zofran.  Labs consistent with known ESRD, no significant electrolyte abnormality noted and potassium within normal limits.  She is not in any respiratory distress and maintaining oxygen saturations at 98% on room air.  LFTs are unremarkable, lipase mildly elevated but doubt pancreatitis given no upper abdominal tenderness.  She does have chronic anemia that is very slightly worse than previous, but adamantly denies any bleeding recently.  Patient turned over to oncoming provider pending CT imaging results of her abdomen/pelvis.      FINAL CLINICAL IMPRESSION(S) / ED DIAGNOSES   Final diagnoses:  Generalized abdominal  pain  ESRD on hemodialysis ([place])     Rx / DC Orders   ED Discharge Orders     None        Note:  This document was prepared using Dragon voice recognition software and may include unintentional dictation errors.   Blake Divine, MD 06/27/22 [phone]

## 2022-06-27 NOTE — ED Notes (Signed)
Pt given dinner tray.

## 2022-06-28 LAB — CBG MONITORING, ED
Glucose-Capillary: 96 mg/dL (ref 70–99)
Glucose-Capillary: 158 mg/dL — ABNORMAL HIGH (ref 70–99)

## 2022-06-28 LAB — HEPATITIS B SURFACE ANTIGEN: Hepatitis B Surface Ag: NONREACTIVE

## 2022-06-28 MED ORDER — NON FORMULARY: 28.0000 [IU] | Freq: Two times a day (BID) | Status: DC

## 2022-06-28 MED ORDER — PENTAFLUOROPROP-TETRAFLUOROETH EX AERO: 1.0000 | INHALATION_SPRAY | CUTANEOUS | Status: DC | PRN

## 2022-06-28 MED ORDER — LIDOCAINE HCL (PF) 1 % IJ SOLN: 5.0000 mL | INTRAMUSCULAR | Status: DC | PRN

## 2022-06-28 MED ORDER — ANTICOAGULANT SODIUM CITRATE 4% (200MG/5ML) IV SOLN: 5.0000 mL | Status: DC | PRN

## 2022-06-28 MED ORDER — LIDOCAINE-PRILOCAINE 2.5-2.5 % EX CREA: 1.0000 | TOPICAL_CREAM | CUTANEOUS | Status: DC | PRN

## 2022-06-28 MED ORDER — ALTEPLASE 2 MG IJ SOLR: 2.0000 mg | Freq: Once | INTRAMUSCULAR | Status: DC | PRN

## 2022-06-28 MED ORDER — HEPARIN SODIUM (PORCINE) 1000 UNIT/ML DIALYSIS: 1000.0000 [IU] | INTRAMUSCULAR | Status: DC | PRN

## 2022-06-28 NOTE — NC FL2 (Signed)
[place] LEVEL OF CARE FORM     IDENTIFICATION  Patient Name: Cynthia Dean Birthdate: Jan 28, 1951 Sex: female Admission Date (Current Location): 06/27/2022  [facility] and [place] Number:  Engineering geologist and Address:  [facility], [address] [place], [place] [address]      Provider Number: [id number]  Attending Physician Name and Address:  No att. providers found  Relative Name and Phone Number:  Mario Voong- daughter [phone]    Current Level of Care: Hospital Recommended Level of Care: [place] Prior Approval Number:    Date Approved/Denied:   PASRR Number: [id number]  Discharge Plan: SNF    Current Diagnoses: Patient Active Problem List   Diagnosis Date Noted   Generalized weakness    Unable to ambulate 05/09/2022   ESRD on dialysis [facility])    Right leg pain    Depression    Pulmonary emboli (HCC)    Obesity with body mass index (BMI) of 30.0 to 39.9    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-stage renal disease) (HCC)    Chronic diastolic CHF (congestive heart failure) (HCC)    Seroma of breast    Bacteremia due to coagulase-negative Staphylococcus    Left breast abscess    Urinary tract infection 03/22/2022   Anemia of chronic disease 03/22/2022   Acute pulmonary embolism without acute cor pulmonale ([place]) 03/20/2022   Dyslipidemia 03/20/2022   Diabetic neuropathy ([place]) 03/20/2022   Sepsis due to cellulitis ([place]) 03/20/2022   Delay kidney tx func d/t fluid overload requiring acute dialysis ([place]) 01/29/2020   Delay kidney tx func d/t ATN and fluid overload require acute dialysis ([place]) 01/29/2020   Hypoglycemia 10/21/2019   Unresponsiveness 10/21/2019   GERD (gastroesophageal reflux disease) 10/21/2019   Hypothermia    Sepsis with encephalopathy without septic shock (HCC)    Diarrhea    Acute metabolic encephalopathy    Acute on chronic diastolic CHF (congestive heart failure) (HCC)    Acute on  chronic respiratory failure with hypoxia ([place]) 07/30/2019   Rectal bleed 02/13/2019   Acute respiratory failure with hypoxia ([place]) 12/31/2018   Aphasia as late effect of cerebrovascular accident (CVA) 05/01/2018   Seizure ([place]) 03/14/2018   Stroke ([place]) 03/07/2018   Slurred speech 11/14/2017   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[place]) 11/12/2017   CAD (coronary artery disease) 11/12/2017   COPD (chronic obstructive pulmonary disease) ([place]) 11/12/2017   ESRD on hemodialysis ([place]) 11/12/2017   CVA (cerebral vascular accident) ([place]) 04/14/2016   Essential hypertension 11/27/2015   Hyperlipidemia 11/27/2015   Prolonged Q-T interval on ECG 35/45/6256   Aphasia complicating stroke 38/93/7342   Cervical spine arthritis 07/27/2015   Diabetic retinopathy without macular edema associated with type 2 diabetes mellitus ([place]) 07/27/2015   Osteoarthritis of spine with radiculopathy, lumbar region 07/27/2015   Obesity, Class III, BMI 40-49.9 (morbid obesity) ([place]) 07/27/2015   Vitamin D deficiency 87/68/1157   Diastolic heart failure ([place]) 10/27/2010    Orientation RESPIRATION BLADDER Height & Weight     Self, Place, Situation, Time  O2 ([place] 2L) Incontinent Weight: 109.7 kg Height:  '5\' 9"'$  (175.3 cm)  BEHAVIORAL SYMPTOMS/MOOD NEUROLOGICAL BOWEL NUTRITION STATUS      Incontinent Diet (Renal diet 1200 ml fluid restriction)  AMBULATORY STATUS COMMUNICATION OF NEEDS Skin   Extensive Assist Verbally Normal                       Personal Care  Assistance Level of Assistance  Bathing, Feeding, Dressing Bathing Assistance: Limited assistance Feeding assistance: Limited assistance Dressing Assistance: Limited assistance     Functional Limitations [place]  PT (By licensed PT), OT (By licensed OT)     PT Frequency: 5 times per week OT Frequency: 5 times per week            Contractures Contractures Info: Not present     Additional Factors Info  Code Status, Allergies Code Status Info: Full Allergies Info: Oxycodone, oxycodone-acetaminophen, wound dressing adhesive, hydrocodone, tape, tapentadol           Current Medications (06/28/2022):  This is the current hospital active medication list Current Facility-Administered Medications  Medication Dose Route Frequency Provider Last Rate Last Admin   acetaminophen (TYLENOL) tablet 500 mg  500 mg Oral Q6H PRN Lucillie Garfinkel, MD       alteplase (CATHFLO ACTIVASE) injection 2 mg  2 mg Intracatheter Once PRN Colon Flattery, NP       anticoagulant sodium citrate solution 5 mL  5 mL Intracatheter PRN Colon Flattery, NP       apixaban (ELIQUIS) tablet 5 mg  5 mg Oral BID Lucillie Garfinkel, MD   5 mg at 06/28/22 1233   aspirin EC tablet 81 mg  81 mg Oral Daily Lucillie Garfinkel, MD   81 mg at 06/28/22 1233   atorvastatin (LIPITOR) tablet 80 mg  80 mg Oral QPM Lucillie Garfinkel, MD   80 mg at 06/27/22 2255   calcium acetate (PHOSLO) capsule 2,001 mg  2,001 mg Oral TID WC Lucillie Garfinkel, MD       Chlorhexidine Gluconate Cloth 2 % PADS 6 each  6 each Topical Q0600 Colon Flattery, NP       cholecalciferol (VITAMIN D3) 25 MCG (1000 UNIT) tablet 1,000 Units  1,000 Units Oral Daily Lucillie Garfinkel, MD   1,000 Units at 06/28/22 1233   cinacalcet (SENSIPAR) tablet 30 mg  30 mg Oral Q breakfast Lucillie Garfinkel, MD       fluticasone Asencion Islam) 50 MCG/ACT nasal spray 1 spray  1 spray Each Nare Daily Lucillie Garfinkel, MD       gabapentin (NEURONTIN) capsule 300 mg  300 mg Oral BID Lucillie Garfinkel, MD   300 mg at 06/27/22 2254   heparin injection 1,000 Units  1,000 Units Intracatheter PRN Colon Flattery, NP       insulin aspart (novoLOG) injection 0-6 Units  0-6 Units Subcutaneous TID WC Lucillie Garfinkel, MD       insulin aspart (novoLOG) injection 2 Units  2 Units Subcutaneous TID WC Lucillie Garfinkel, MD       ipratropium-albuterol (DUONEB) 0.5-2.5 (3) MG/3ML nebulizer solution 3 mL  3 mL Inhalation Q6H PRN Lucillie Garfinkel, MD       levETIRAcetam (KEPPRA) tablet 1,000 mg  1,000 mg Oral Daily Lucillie Garfinkel, MD       Derrill Memo ON 06/29/2022] levETIRAcetam (KEPPRA) tablet 250 mg  250 mg Oral Once per day on Mon Wed Fri Wong, Silas, MD       lidocaine (PF) (XYLOCAINE) 1 % injection 5 mL  5 mL Intradermal PRN Colon Flattery, NP       lidocaine-prilocaine (EMLA) cream 1 Application  1 Application Topical PRN Colon Flattery, NP       losartan (COZAAR) tablet 100 mg  100 mg Oral QHS Lucillie Garfinkel, MD   100 mg at 06/27/22  2252   metoprolol succinate (TOPROL-XL) 24 hr tablet 100 mg  100 mg Oral Daily Lucillie Garfinkel, MD   100 mg at 06/28/22 1233   pantoprazole (PROTONIX) EC tablet 40 mg  40 mg Oral Daily Lucillie Garfinkel, MD   40 mg at 06/28/22 1233   pentafluoroprop-tetrafluoroeth (GEBAUERS) aerosol 1 Application  1 Application Topical PRN Colon Flattery, NP       sertraline (ZOLOFT) tablet 25 mg  25 mg Oral Daily Lucillie Garfinkel, MD   25 mg at 06/28/22 1232   torsemide (DEMADEX) tablet 100 mg  100 mg Oral Daily Lucillie Garfinkel, MD   100 mg at 06/28/22 1232   traZODone (DESYREL) tablet 50 mg  50 mg Oral Lillette Boxer, MD   50 mg at 06/27/22 2254   Current Outpatient Medications  Medication Sig Dispense Refill   apixaban (ELIQUIS) 5 MG TABS tablet Take 1 tablet (5 mg total) by mouth 2 (two) times daily. 60 tablet 0   aspirin EC 81 MG tablet Take 81 mg by mouth daily. Swallow whole.     atorvastatin (LIPITOR) 80 MG tablet Take 80 mg by mouth every evening.      calcium acetate (PHOSLO) 667 MG capsule Take 2,001 mg by mouth 3 (three) times daily with meals.     cholecalciferol (VITAMIN D) 25 MCG tablet Take 1 tablet (1,000 Units total) by mouth daily.     cinacalcet (SENSIPAR) 30 MG tablet Take 30 mg by mouth daily with breakfast.     fluticasone (FLONASE) 50 MCG/ACT nasal spray Place 1 spray into both nostrils daily.     gabapentin (NEURONTIN) 300 MG capsule Take 300 mg by mouth 2 (two) times daily.     insulin NPH-regular Human  (NOVOLIN 70/30) (70-30) 100 UNIT/ML injection Inject 28 Units into the skin 2 (two) times daily with a meal.     Ipratropium-Albuterol (COMBIVENT) 20-100 MCG/ACT AERS respimat Inhale 1 puff into the lungs every 6 (six) hours. (Patient taking differently: Inhale 1 puff into the lungs 2 (two) times daily.) 4 g 0   levETIRAcetam (KEPPRA) 1000 MG tablet Take 1 tablet (1,000 mg total) by mouth daily. 30 tablet 0   losartan (COZAAR) 100 MG tablet Take 100 mg by mouth at bedtime.     metoprolol succinate (TOPROL-XL) 100 MG 24 hr tablet Take 100 mg by mouth daily.     omeprazole (PRILOSEC) 20 MG capsule Take 20 mg by mouth 2 (two) times daily.      sertraline (ZOLOFT) 25 MG tablet Take 25 mg by mouth daily.     torsemide (DEMADEX) 100 MG tablet Take 100 mg by mouth daily.     traZODone (DESYREL) 50 MG tablet Take 0.5 tablets (25 mg total) by mouth at bedtime. (Patient taking differently: Take 50 mg by mouth at bedtime.) 10 tablet 0   acetaminophen (TYLENOL) 500 MG tablet Take 500 mg by mouth every 6 (six) hours as needed.     diphenoxylate-atropine (LOMOTIL) 2.5-0.025 MG tablet SMARTSIG:1 Tablet(s) By Mouth Every 12 Hours PRN     glucose 4 GM chewable tablet Chew 1 tablet by mouth as needed for low blood sugar.     levETIRAcetam (KEPPRA) 250 MG tablet Take 250 mg by mouth 3 (three) times a week.     lidocaine (LIDODERM) 5 % Place 1 patch onto the skin daily. Remove & Discard patch within 12 hours or as directed by MD 30 patch 0   lidocaine-prilocaine (EMLA) cream Apply 1 application topically as  needed (for port access).      loperamide (IMODIUM) 2 MG capsule Take 2 mg by mouth 4 (four) times daily as needed.     methylPREDNISolone (MEDROL) 4 MG tablet Take 1 tablet (4 mg total) by mouth daily. (Patient not taking: Reported on 06/27/2022) 1 tablet 0     Discharge Medications: Please see discharge summary for a list of discharge medications.  Relevant Imaging Results:  Relevant Lab  Results:   Additional Information SS#: [id number]. HD MWF [place] 6:00 am.  Shelbie Hutching, RN

## 2022-06-28 NOTE — Progress Notes (Signed)
PT Cancellation Note  Patient Details Name: Cynthia Dean MRN: [id number] DOB: 1950/11/22   Cancelled Treatment:     Chart reviewed, RN consulted. Pt being dialyzed at this time. Will attempt PT evaluation at later date/time.    3:51 PM, 06/28/22 Etta Grandchild, PT, DPT Physical Therapist - [facility]  [phone] ([place])    Rye Dorado C 06/28/2022, 3:50 PM

## 2022-06-28 NOTE — Discharge Planning (Signed)
[facility]  [address], [place] [address] [phone]  Scheduled Days Monday Wednesday and Friday  Treatment Time: 6:00am

## 2022-06-28 NOTE — ED Notes (Signed)
Writer rounded on patient. Pt's had a BM in brief. Writer performed peri care and changed brief.

## 2022-06-28 NOTE — ED Notes (Signed)
Pt refusing some medications due to not wanting to swallow anymore.

## 2022-06-28 NOTE — Progress Notes (Signed)
[facility]  ROUNDING NOTE   Subjective:   Cynthia Dean is a 71 y.o. 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 Patient presents to ED with complaints of bilateral lower extremity weakness and abdominal pain. She has been admitted for Abdominal Pain  Patient is known to our practice and receives outpatient dialysis treatments at [facility] on a MWF schedule, supervised by Dr. Holley Raring.  Last treatment completed on Friday.  Patient states she was preparing to go to dialysis on Monday.  She states she was  preparing but found it difficult with left lower quadrant pain.  Also complained of associated nausea.  Labs appear stable for renal patient.  Hemoglobin slightly decreased at 7.6.  Respiratory panel negative for influenza and COVID-19.  CT abdomen pelvis with no acute findings.  We have been consulted to manage dialysis needs.  Objective:  Vital signs in last 24 hours:  Temp:  [97.8 F (36.6 C)-98.3 F (36.8 C)] 97.8 F (36.6 C) (12/05 1512) Pulse Rate:  [53-68] 60 (12/05 1512) Resp:  [13-20] 20 (12/05 1512) BP: (95-166)/(32-110) 123/55 (12/05 1512) SpO2:  [91 %-100 %] 100 % (12/05 1512)  Weight change:  Filed Weights   06/27/22 0610  Weight: 109.7 kg    Intake/Output: No intake/output data recorded.   Intake/Output this shift:  Total I/O In: -  Out: 1300 [Other:1300]  Physical Exam: General: NAD  Head: Normocephalic, atraumatic. Moist oral mucosal membranes  Eyes: Anicteric  Lungs:  Clear to auscultation  Heart: Regular rate and rhythm  Abdomen:  Soft, nontender,   Extremities: No peripheral edema.  Neurologic: Nonfocal, moving all four extremities  Skin: No lesions  Access: Lt pErmcath    Basic Metabolic Panel: Recent Labs  Lab 06/27/22 0611  NA 139  K 4.6  CL 101  CO2 22  GLUCOSE 174*  BUN 57*   CREATININE 9.45*  CALCIUM 8.3*    Liver Function Tests: Recent Labs  Lab 06/27/22 0611  AST 12*  ALT 11  ALKPHOS 107  BILITOT 0.7  PROT 7.4  ALBUMIN 3.4*   Recent Labs  Lab 06/27/22 0611  LIPASE 56*   No results for input(s): "AMMONIA" in the last 168 hours.  CBC: Recent Labs  Lab 06/27/22 0611  WBC 11.0*  HGB 7.6*  HCT 25.3*  MCV 87.2  PLT 188    Cardiac Enzymes: No results for input(s): "CKTOTAL", "CKMB", "CKMBINDEX", "TROPONINI" in the last 168 hours.  BNP: Invalid input(s): "POCBNP"  CBG: Recent Labs  Lab 06/27/22 1851 06/27/22 2221 06/28/22 1201  GLUCAP 127* 126* 45    Microbiology: Results for orders placed or performed during the hospital encounter of 06/27/22  Resp Panel by RT-PCR (Flu A&B, Covid) Anterior Nasal Swab     Status: None   Collection Time: 06/27/22  6:52 PM   Specimen: Anterior Nasal Swab  Result Value Ref Range Status   SARS Coronavirus 2 by RT PCR NEGATIVE NEGATIVE Final    Comment: (NOTE) SARS-CoV-2 target nucleic acids are NOT DETECTED.  The SARS-CoV-2 RNA is generally detectable in upper respiratory specimens during the acute phase of infection. The lowest concentration of SARS-CoV-2 viral copies this assay can detect is 138 copies/mL. A negative result does not preclude SARS-Cov-2 infection and should not be used as the sole basis for treatment or other patient management decisions. A negative result may occur with  improper specimen collection/handling, submission  of specimen other than nasopharyngeal swab, presence of viral mutation(s) within the areas targeted by this assay, and inadequate number of viral copies(<138 copies/mL). A negative result must be combined with clinical observations, patient history, and epidemiological information. The expected result is Negative.  Fact Sheet for Patients:  EntrepreneurPulse.com.au  Fact Sheet for Healthcare Providers:   IncredibleEmployment.be  This test is no t yet approved or cleared by the [place] FDA and  has been authorized for detection and/or diagnosis of SARS-CoV-2 by FDA under an Emergency Use Authorization (EUA). This EUA will remain  in effect (meaning this test can be used) for the duration of the COVID-19 declaration under Section 564(b)(1) of the Act, 21 U.S.C.section 360bbb-3(b)(1), unless the authorization is terminated  or revoked sooner.       Influenza A by PCR NEGATIVE NEGATIVE Final   Influenza B by PCR NEGATIVE NEGATIVE Final    Comment: (NOTE) The Xpert Xpress SARS-CoV-2/FLU/RSV plus assay is intended as an aid in the diagnosis of influenza from Nasopharyngeal swab specimens and should not be used as a sole basis for treatment. Nasal washings and aspirates are unacceptable for Xpert Xpress SARS-CoV-2/FLU/RSV testing.  Fact Sheet for Patients: EntrepreneurPulse.com.au  Fact Sheet for Healthcare Providers: IncredibleEmployment.be  This test is not yet approved or cleared by the [place] FDA and has been authorized for detection and/or diagnosis of SARS-CoV-2 by FDA under an Emergency Use Authorization (EUA). This EUA will remain in effect (meaning this test can be used) for the duration of the COVID-19 declaration under Section 564(b)(1) of the Act, 21 U.S.C. section 360bbb-3(b)(1), unless the authorization is terminated or revoked.  Performed at [facility], [address] [place], [place] [address]     Coagulation Studies: No results for input(s): "LABPROT", "INR" in the last 72 hours.  Urinalysis: No results for input(s): "COLORURINE", "LABSPEC", "PHURINE", "GLUCOSEU", "HGBUR", "BILIRUBINUR", "KETONESUR", "PROTEINUR", "UROBILINOGEN", "NITRITE", "LEUKOCYTESUR" in the last 72 hours.  Invalid input(s): "APPERANCEUR"    Imaging: CT Abdomen Pelvis W Contrast  Result Date:  06/27/2022 CLINICAL DATA:  Abdominal pain. EXAM: CT ABDOMEN AND PELVIS WITH CONTRAST TECHNIQUE: Multidetector CT imaging of the abdomen and pelvis was performed using the standard protocol following bolus administration of intravenous contrast. RADIATION DOSE REDUCTION: This exam was performed according to the departmental dose-optimization program which includes automated exposure control, adjustment of the mA and/or kV according to patient size and/or use of iterative reconstruction technique. CONTRAST:  160m OMNIPAQUE IOHEXOL 300 MG/ML  SOLN COMPARISON:  CT chest 03/24/22, CT AP 01/26/22 FINDINGS: Lower chest: Bibasilar atelectasis. Hepatobiliary: Limited normal contour. No new focal liver lesions are visualized. There is a subtle blush of contrast enhancement along the medial aspect of the right hepatic lobe, unchanged compared prior exam. Gallbladder is distended without evidence of cholelithiasis or other findings to suggest acute cholecystitis. Pancreas: Unremarkable. No pancreatic ductal dilatation or surrounding inflammatory changes. Spleen: No splenic injury or perisplenic hematoma. Spleen is normal in size. There is a indeterminate hypodense lesion along the left lateral margin of the spleen as well as the splenic hilum, unchanged from prior exam. 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 The right adrenal glands poorly visualized. There is no evidence of hydronephrosis. There are bilateral renal stones. Bilateral kidneys are small in size and demonstrate slightly heterogeneous contrast enhancement. There are multiple hypodense lesions within bilateral kidneys which appear grossly unchanged compared to prior exam, likely represent  small renal cysts. Some lesions are too small to accurately characterize, for example at the upper pole of the right kidney. Stomach/B1owel: The stomach, small bowel,  large bowel are normal in caliber. No focal wall thickening. No evidence of bowel obstruction. Diverticulosis without evidence of diverticulitis. Appendix is normal in appearance. There is severe atherosclerotic plaque involving the mesenteric vasculature. No convincing evidence of stenosis or occlusion. Vascular/Lymphatic: There is extensive atherosclerotic plaque of the abdominal and pelvic vasculature. Reproductive: Uterus and bilateral adnexa are unremarkable. Other: No abdominal wall hernia or abnormality. No abdominopelvic ascites. There is persistent masslike thickening in the left breast measuring 4.2 x 3.2 cm (series 2, image 1). There is also overlying skin thickening. There are focal subcutaneous rounded regions of soft tissue thickening, image are favored to be injection related. Musculoskeletal: Moderate spinal canal narrowing at L4-L5. Severe bilateral neural foraminal narrowing at L4-L5 and L5-S1. IMPRESSION: 1. No acute abnormality in the abdomen or pelvis. 2. Persistent masslike thickening in the left breast measuring 4.2 x 3.2 cm with new overlying skin thickening. Findings remain worrisome for malignancy. 3. Severe bilateral neural foraminal narrowing at L4-L5 and L5-S1. Moderate spinal canal narrowing at L4-L5. Electronically Signed   By: Marin Roberts M.D.   On: 06/27/2022 16:11     Medications:    anticoagulant sodium citrate      apixaban  5 mg Oral BID   aspirin EC  81 mg Oral Daily   atorvastatin  80 mg Oral QPM   calcium acetate  2,001 mg Oral TID WC   Chlorhexidine Gluconate Cloth  6 each Topical Q0600   vitamin D3  1,000 Units Oral Daily   cinacalcet  30 mg Oral Q breakfast   fluticasone  1 spray Each Nare Daily   gabapentin  300 mg Oral BID   insulin aspart  0-6 Units Subcutaneous TID WC   insulin aspart  2 Units Subcutaneous TID WC   levETIRAcetam  1,000 mg Oral Daily   [START ON 06/29/2022] levETIRAcetam  250 mg Oral Once per day on Mon Wed Fri   losartan  100 mg  Oral QHS   metoprolol succinate  100 mg Oral Daily   pantoprazole  40 mg Oral Daily   sertraline  25 mg Oral Daily   torsemide  100 mg Oral Daily   traZODone  50 mg Oral QHS   acetaminophen, alteplase, anticoagulant sodium citrate, heparin, ipratropium-albuterol, lidocaine (PF), lidocaine-prilocaine, pentafluoroprop-tetrafluoroeth  Assessment/ Plan:  Cynthia Dean is a 71 y.o.  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 Patient presents to ED with complaints of bilateral lower extremity weakness and abdominal pain. She has been admitted for Abdominal Pain  [facility]/MWF/Lt AVG   Anemia of chronic kidney disease Lab Results  Component Value Date   HGB 7.6 (L) 06/27/2022   Hgb below target. Will order EPO with dialysis.   2. End stage renal disease on hemodialysis. Missed treatment yesterday due to ED visit. Received treatment today, UF 1.5L achieved. Next treatment scheduled for Wednesday, to maintain outpatient schedule.   3. Secondary Hyperparathyroidism: with outpatient labs: PTH 613, phosphorus 4.8, calcium 9.0 on 03/07/22.   Lab Results  Component Value Date   PTH 131 (H) 03/14/2018   CALCIUM 8.3 (L) 06/27/2022   PHOS 6.5 (H) 05/11/2022    Calcium acceptable at this time.   4. Hypertension with chronic kidney disease.  Home regimen includes losartan, metoprolol, and torsemide. Currently receiving these medications.      LOS: 0   12/5/20234:42 PM

## 2022-06-28 NOTE — ED Notes (Signed)
Report received from dialysis nurse. Pt returning to ED 19

## 2022-06-28 NOTE — ED Notes (Signed)
Pt had large loose BM. This RN and EMT student cleansed perineal area and replaced brief. Pt has right lower quadrant cramping.

## 2022-06-28 NOTE — ED Notes (Signed)
Resumed care from natalie rn.  Pt alert  pt on 2 liters oxygen [place].  Pt watching tv.

## 2022-06-28 NOTE — Progress Notes (Signed)
Pt completed 3.5 hour HD treatment w/ no complications. Alert, stable, no c/o, report to ED RN.  Start: 0810 End: 1125 1340m fluid removed 67L BVP UTA weight on ED stretcher. Not accurate.  No HD meds ordered.

## 2022-06-28 NOTE — TOC Initial Note (Signed)
Transition of Care [facility]) - Initial/Assessment Note    Patient Details  Name: Cynthia Dean MRN: [id number] Date of Birth: [birth year]  [facility] CM/SW Contact:    Shelbie Hutching, RN Phone Number: 06/28/2022, 3:36 PM  Clinical Narrative:                 RNCM spoke with patient's daughter Joelene Millin who reports she is the POA.  She would like to see about the patient going back to [facility], patient was previously at [facility] for about 3 weeks and released at the beginning of November.  Patient has been very weak at home and not getting around very well.  Therapy eval has been ordered.  RNCM will start SNF workup.  Patient did get dialysis today.   Expected Discharge Plan: Skilled Nursing Facility Barriers to Discharge: SNF Pending bed offer   Patient Goals and CMS Choice Patient states their goals for this hospitalization and ongoing recovery are:: daughter hoping patient can go to [facility] CMS Medicare.gov Compare Post Acute Care list provided to:: Patient Represenative (must comment) Choice offered to / list presented to : Adult Children  Expected Discharge Plan and Services Expected Discharge Plan: [place]   Discharge Planning Services: CM Consult Post Acute Care Choice: [place] Living arrangements for the past 2 months: Single Family Home                 DME Arranged: N/A DME Agency: NA       HH Arranged: NA HH Agency: NA        Prior Living Arrangements/Services Living arrangements for the past 2 months: Single Family Home Lives with:: Significant Other, Adult Children Patient language and need for interpreter reviewed:: Yes Do you feel safe going back to the place where you live?: Yes      Need for Family Participation in Patient Care: Yes (Comment) Care giver support system in place?: Yes (comment) Current home services: DME Criminal Activity/Legal Involvement Pertinent to Current Situation/Hospitalization: No - Comment  as needed  Activities of Daily Living      Permission Sought/Granted Permission sought to share information with : Case Manager, Family Supports Permission granted to share information with : Yes, Verbal Permission Granted  Share Information with NAME: Jaxie Racanelli     Permission granted to share info w Relationship: daughter  Permission granted to share info w Contact Information: [phone]  Emotional Assessment         Alcohol / Substance Use: Not Applicable Psych Involvement: No (comment)  Admission diagnosis:  Abdominal Pain Patient Active Problem List   Diagnosis Date Noted   Generalized weakness    Unable to ambulate 05/09/2022   ESRD on dialysis [facility])    Right leg pain    Depression    Pulmonary emboli ([place])    Obesity with body mass index (BMI) of 30.0 to 39.9    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-stage renal disease) ([place])    Chronic diastolic CHF (congestive heart failure) (HCC)    Seroma of breast    Bacteremia due to coagulase-negative Staphylococcus    Left breast abscess    Urinary tract infection 03/22/2022   Anemia of chronic disease 03/22/2022   Acute pulmonary embolism without acute cor pulmonale ([place]) 03/20/2022   Dyslipidemia 03/20/2022   Diabetic neuropathy ([place]) 03/20/2022   Sepsis due to cellulitis ([place]) 03/20/2022   Delay kidney tx func d/t fluid overload requiring acute dialysis ([place]) 01/29/2020   Delay kidney tx  func d/t ATN and fluid overload require acute dialysis ([place]) 01/29/2020   Hypoglycemia 10/21/2019   Unresponsiveness 10/21/2019   GERD (gastroesophageal reflux disease) 10/21/2019   Hypothermia    Sepsis with encephalopathy without septic shock (HCC)    Diarrhea    Acute metabolic encephalopathy    Acute on chronic diastolic CHF (congestive heart failure) ([place])    Acute on chronic respiratory failure with hypoxia ([place]) 07/30/2019   Rectal bleed 02/13/2019   Acute respiratory failure with hypoxia ([place]) 12/31/2018   Aphasia as late  effect of cerebrovascular accident (CVA) 05/01/2018   Seizure ([place]) 03/14/2018   Stroke ([place]) 03/07/2018   Slurred speech 11/14/2017   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[place]) 11/12/2017   CAD (coronary artery disease) 11/12/2017   COPD (chronic obstructive pulmonary disease) ([place]) 11/12/2017   ESRD on hemodialysis ([place]) 11/12/2017   CVA (cerebral vascular accident) ([place]) 04/14/2016   Essential hypertension 11/27/2015   Hyperlipidemia 11/27/2015   Prolonged Q-T interval on ECG 57/07/7791   Aphasia complicating stroke 90/30/0923   Cervical spine arthritis 07/27/2015   Diabetic retinopathy without macular edema associated with type 2 diabetes mellitus ([place]) 07/27/2015   Osteoarthritis of spine with radiculopathy, lumbar region 07/27/2015   Obesity, Class III, BMI 40-49.9 (morbid obesity) ([place]) 07/27/2015   Vitamin D deficiency 30/01/6225   Diastolic heart failure ([place]) 10/27/2010   PCP:  Lowella Bandy, MD Pharmacy:   [facility] [address] [place] [address] Phone: [phone] Fax: [phone]     Social Determinants of Health (SDOH) Interventions    Readmission Risk Interventions    10/25/2019    8:43 AM 10/24/2019   10:14 AM  Readmission Risk Prevention Plan  Transportation Screening Complete   PCP or Specialist Appt within 3-5 Days Not Complete   HRI or [place] Complete Complete  Palliative Care Screening Not Applicable   Medication Review (RN Care Manager) Complete

## 2022-06-28 NOTE — Progress Notes (Signed)
Per MD request, patient HD being ended 20 minutes early to bring another ED patient.

## 2022-06-28 NOTE — ED Notes (Signed)
Meds given  family with pt.  Pt alert watching tv.  Oxygen 2 liters [place] in place.

## 2022-06-28 NOTE — Progress Notes (Signed)
Pre hd rn assessment 

## 2022-06-28 NOTE — ED Provider Notes (Signed)
Emergency Medicine Observation Re-evaluation Note  Cynthia Dean is a 71 y.o. female, seen on rounds today.    Physical Exam  BP (!) 123/55 (BP Location: Right Arm)   Pulse 60   Temp 97.8 F (36.6 C) (Oral)   Resp 20   Ht '5\' 9"'$  (1.753 m)   Wt 109.7 kg   SpO2 100%   BMI 35.71 kg/m  Physical Exam General: Patient resting comfortably in bed Lungs: Patient not in respiratory distress Psych: Patient not combative  ED Course / MDM  EKG:  Plan  Current plan is for social work placement.    Nena Polio, MD 06/28/22 [phone]

## 2022-06-28 NOTE — ED Notes (Signed)
Pt had 2x loose stools. This RN cleansed perineal area, replaced brief. Transport came to take pt to dialysis

## 2022-06-28 NOTE — ED Notes (Signed)
Nurse, mental health of patient's upcoming HD treatment and inquired about how whether HD is done in the ED or in HD area of the hospital. Charge RN states she will look into the matter

## 2022-06-28 NOTE — Progress Notes (Signed)
Post hd rn assessment 

## 2022-06-28 NOTE — ED Notes (Signed)
Jacelyn Grip, MD assessing patient for AMS. Pt A&Ox4 during assessment and back to baseline.

## 2022-06-28 NOTE — ED Notes (Signed)
Son, Barbaraann Rondo, updated per pt request

## 2022-06-28 NOTE — ED Notes (Signed)
Fsbs 158

## 2022-06-28 NOTE — ED Provider Notes (Signed)
-----------------------------------------   5:52 AM on 06/28/2022 -----------------------------------------   Blood pressure (!) 163/75, pulse 67, temperature 97.8 F (36.6 C), temperature source Oral, resp. rate 15, height '5\' 9"'$  (1.753 m), weight 109.7 kg, SpO2 97 %.  The patient is calm and cooperative at this time.  There have been no acute events since the last update.  Awaiting disposition plan from case management/social work.    Jermon Chalfant, Delice Bison, DO 06/28/22 [phone]

## 2022-06-29 LAB — CBG MONITORING, ED
Glucose-Capillary: 168 mg/dL — ABNORMAL HIGH (ref 70–99)
Glucose-Capillary: 131 mg/dL — ABNORMAL HIGH (ref 70–99)
Glucose-Capillary: 148 mg/dL — ABNORMAL HIGH (ref 70–99)

## 2022-06-29 LAB — HEPATITIS B SURFACE ANTIBODY, QUANTITATIVE: Hep B S AB Quant (Post): 4.2 m[IU]/mL — ABNORMAL LOW (ref >9.9)

## 2022-06-29 LAB — HEPATITIS B E ANTIBODY: Hep B E Ab: NEGATIVE

## 2022-06-29 MED ORDER — LORAZEPAM 2 MG/ML IJ SOLN
0.5000 mg | Freq: Once | INTRAMUSCULAR | Status: AC
  Administered 2022-06-29: 0.5 mg via INTRAMUSCULAR
  Filled 2022-06-29: qty 1

## 2022-06-29 MED ORDER — PENTAFLUOROPROP-TETRAFLUOROETH EX AERO
INHALATION_SPRAY | CUTANEOUS | Status: AC
  Filled 2022-06-29: qty 30

## 2022-06-29 MED ORDER — EPOETIN ALFA 10000 UNIT/ML IJ SOLN
10000.0000 [IU] | INTRAMUSCULAR | Status: DC
  Filled 2022-06-29: qty 1

## 2022-06-29 NOTE — Progress Notes (Signed)
Pt completed 3 hour HD treatment w/ no complications. Pt alert, no c/o, stable and report to ED RN.  Start: 0900 End: 1209 10105m fluid removed 71.9L BVP UTA weight d/t no scale on ED stretcher and pt unable to stand.  No HD meds ordered.

## 2022-06-29 NOTE — Progress Notes (Signed)
PT Cancellation Note  Patient Details Name: Cynthia Dean MRN: [id number] DOB: 06/21/1951   Cancelled Treatment:    Reason Eval/Treat Not Completed: Patient at procedure or test/unavailable (Attempted to see pt early. Pt already OTF for HD. WIll conitnue to follow and attempt evaluation again at later date/time.)   8:32 AM, 06/29/22 Etta Grandchild, PT, DPT Physical Therapist - [facility]  [phone] ([place])     Prajwal Fellner C 06/29/2022, 8:32 AM

## 2022-06-29 NOTE — Progress Notes (Signed)
PT Cancellation Note  Patient Details Name: Cynthia Dean MRN: [id number] DOB: 02/22/1951   Cancelled Treatment:    Reason Eval/Treat Not Completed: Fatigue/lethargy limiting ability to participate;Other (comment) (Pt back from HD, now in ED 18H. Pt sleeping, but awakenes to voice. Pt reports she did not received a lunch tray. Author visuallizes her breakfast tray in hallway, untouched from this morning. Author calls dietary and asks for tray to be re-sent.) Will prioritize pt getting a meal to optimize her ability to partake in PT evaluation and demonstrate an accurate demonstration of activity tolerance.    1:28 PM, 06/29/22 Etta Grandchild, PT, DPT Physical Therapist - [facility]  [phone] ([place])    [name] 06/29/2022, 1:27 PM

## 2022-06-29 NOTE — ED Notes (Signed)
Pt attempting to climb out of bed, putting shoes on, stating she's leaving. Attempts to educate and redirect pt unsuccessful. Attempts to reposition pt in bed. Pt swatting at staff with shoe and pinching staff. Multiple attempts again made to redirect without success. Bed alarm placed and activated. Bed in lowest position.

## 2022-06-29 NOTE — ED Provider Notes (Signed)
-----------------------------------------   6:57 AM on 06/29/2022 -----------------------------------------   Blood pressure (!) 184/76, pulse 64, temperature 97.8 F (36.6 C), temperature source Oral, resp. rate 20, height '5\' 9"'$  (1.753 m), weight 109.7 kg, SpO2 96 %.  The patient is calm and cooperative at this time.  There have been no acute events since the last update.  Awaiting disposition plan from Social Work team.   Paulette Blanch, MD 06/29/22 [phone]

## 2022-06-29 NOTE — Progress Notes (Signed)
Post hd rn assessment 

## 2022-06-29 NOTE — ED Provider Notes (Signed)
Emergency Medicine Observation Re-evaluation Note  Cynthia Dean is a 71 y.o. female, seen on rounds today.  Physical Exam  BP (!) 163/68 (BP Location: Right Arm)   Pulse 65   Temp 98.5 F (36.9 C) (Oral)   Resp 16   Ht '5\' 9"'$  (1.753 m)   Wt 109.7 kg   SpO2 100%   BMI 35.71 kg/m  Physical Exam General: Patient resting comfortably in the hallway bed Lungs: Patient not in respiratory distress Psych: Patient not combative  ED Course / MDM  EKG  Plan  Current plan is for work placement.    Nena Polio, MD 06/29/22 1556

## 2022-06-29 NOTE — Progress Notes (Signed)
[facility]  ROUNDING NOTE   Subjective:   Cynthia Dean is a 71 y.o. 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 Patient presents to ED with complaints of bilateral lower extremity weakness and abdominal pain. She has been admitted for Abdominal Pain  Patient is known to our practice and receives outpatient dialysis treatments at [facility] on a MWF schedule, supervised by Dr. Holley Raring.    Patient seen and evaluated during dialysis   HEMODIALYSIS FLOWSHEET:  Blood Flow Rate (mL/min): 400 mL/min Arterial Pressure (mmHg): -200 mmHg Venous Pressure (mmHg): 250 mmHg TMP (mmHg): 13 mmHg Ultrafiltration Rate (mL/min): 600 mL/min Dialysate Flow Rate (mL/min): 300 ml/min Dialysis Fluid Bolus: Normal Saline  Tolerating treatment well Continues to have difficulty ambulating.   Objective:  Vital signs in last 24 hours:  Temp:  [97.8 F (36.6 C)-98.5 F (36.9 C)] 98.5 F (36.9 C) (12/06 1209) Pulse Rate:  [60-94] 65 (12/06 1230) Resp:  [12-20] 16 (12/06 1230) BP: (123-189)/(53-85) 163/68 (12/06 1230) SpO2:  [95 %-100 %] 100 % (12/06 1230)  Weight change:  Filed Weights   06/27/22 0610  Weight: 109.7 kg    Intake/Output: I/O last 3 completed shifts: In: -  Out: 1300 [Other:1300]   Intake/Output this shift:  Total I/O In: -  Out: 1000 [Other:1000]  Physical Exam: General: NAD  Head: Normocephalic, atraumatic. Moist oral mucosal membranes  Eyes: Anicteric  Lungs:  Clear to auscultation  Heart: Regular rate and rhythm  Abdomen:  Soft, nontender,   Extremities: No peripheral edema.  Neurologic: Nonfocal, moving all four extremities  Skin: No lesions  Access: Lt pErmcath    Basic Metabolic Panel: Recent Labs  Lab 06/27/22 0611  NA 139  K 4.6  CL 101  CO2 22  GLUCOSE 174*  BUN 57*  CREATININE 9.45*   CALCIUM 8.3*     Liver Function Tests: Recent Labs  Lab 06/27/22 0611  AST 12*  ALT 11  ALKPHOS 107  BILITOT 0.7  PROT 7.4  ALBUMIN 3.4*    Recent Labs  Lab 06/27/22 0611  LIPASE 56*    No results for input(s): "AMMONIA" in the last 168 hours.  CBC: Recent Labs  Lab 06/27/22 0611  WBC 11.0*  HGB 7.6*  HCT 25.3*  MCV 87.2  PLT 188     Cardiac Enzymes: No results for input(s): "CKTOTAL", "CKMB", "CKMBINDEX", "TROPONINI" in the last 168 hours.  BNP: Invalid input(s): "POCBNP"  CBG: Recent Labs  Lab 06/27/22 2221 06/28/22 1201 06/28/22 1830 06/29/22 0613 06/29/22 1339  GLUCAP 126* 96 158* 168* 131*     Microbiology: Results for orders placed or performed during the hospital encounter of 06/27/22  Resp Panel by RT-PCR (Flu A&B, Covid) Anterior Nasal Swab     Status: None   Collection Time: 06/27/22  6:52 PM   Specimen: Anterior Nasal Swab  Result Value Ref Range Status   SARS Coronavirus 2 by RT PCR NEGATIVE NEGATIVE Final    Comment: (NOTE) SARS-CoV-2 target nucleic acids are NOT DETECTED.  The SARS-CoV-2 RNA is generally detectable in upper respiratory specimens during the acute phase of infection. The lowest concentration of SARS-CoV-2 viral copies this assay can detect is 138 copies/mL. A negative result does not preclude SARS-Cov-2 infection and should not be used as the sole basis for treatment or other patient management decisions. A negative result may occur with  improper specimen collection/handling, submission  of specimen other than nasopharyngeal swab, presence of viral mutation(s) within the areas targeted by this assay, and inadequate number of viral copies(<138 copies/mL). A negative result must be combined with clinical observations, patient history, and epidemiological information. The expected result is Negative.  Fact Sheet for Patients:  EntrepreneurPulse.com.au  Fact Sheet for Healthcare Providers:   IncredibleEmployment.be  This test is no t yet approved or cleared by the [place] FDA and  has been authorized for detection and/or diagnosis of SARS-CoV-2 by FDA under an Emergency Use Authorization (EUA). This EUA will remain  in effect (meaning this test can be used) for the duration of the COVID-19 declaration under Section 564(b)(1) of the Act, 21 U.S.C.section 360bbb-3(b)(1), unless the authorization is terminated  or revoked sooner.       Influenza A by PCR NEGATIVE NEGATIVE Final   Influenza B by PCR NEGATIVE NEGATIVE Final    Comment: (NOTE) The Xpert Xpress SARS-CoV-2/FLU/RSV plus assay is intended as an aid in the diagnosis of influenza from Nasopharyngeal swab specimens and should not be used as a sole basis for treatment. Nasal washings and aspirates are unacceptable for Xpert Xpress SARS-CoV-2/FLU/RSV testing.  Fact Sheet for Patients: EntrepreneurPulse.com.au  Fact Sheet for Healthcare Providers: IncredibleEmployment.be  This test is not yet approved or cleared by the [place] FDA and has been authorized for detection and/or diagnosis of SARS-CoV-2 by FDA under an Emergency Use Authorization (EUA). This EUA will remain in effect (meaning this test can be used) for the duration of the COVID-19 declaration under Section 564(b)(1) of the Act, 21 U.S.C. section 360bbb-3(b)(1), unless the authorization is terminated or revoked.  Performed at [facility], [address] [place], [place] [address]     Coagulation Studies: No results for input(s): "LABPROT", "INR" in the last 72 hours.  Urinalysis: No results for input(s): "COLORURINE", "LABSPEC", "PHURINE", "GLUCOSEU", "HGBUR", "BILIRUBINUR", "KETONESUR", "PROTEINUR", "UROBILINOGEN", "NITRITE", "LEUKOCYTESUR" in the last 72 hours.  Invalid input(s): "APPERANCEUR"    Imaging: CT Abdomen Pelvis W Contrast  Result Date:  06/27/2022 CLINICAL DATA:  Abdominal pain. EXAM: CT ABDOMEN AND PELVIS WITH CONTRAST TECHNIQUE: Multidetector CT imaging of the abdomen and pelvis was performed using the standard protocol following bolus administration of intravenous contrast. RADIATION DOSE REDUCTION: This exam was performed according to the departmental dose-optimization program which includes automated exposure control, adjustment of the mA and/or kV according to patient size and/or use of iterative reconstruction technique. CONTRAST:  173m OMNIPAQUE IOHEXOL 300 MG/ML  SOLN COMPARISON:  CT chest 03/24/22, CT AP 01/26/22 FINDINGS: Lower chest: Bibasilar atelectasis. Hepatobiliary: Limited normal contour. No new focal liver lesions are visualized. There is a subtle blush of contrast enhancement along the medial aspect of the right hepatic lobe, unchanged compared prior exam. Gallbladder is distended without evidence of cholelithiasis or other findings to suggest acute cholecystitis. Pancreas: Unremarkable. No pancreatic ductal dilatation or surrounding inflammatory changes. Spleen: No splenic injury or perisplenic hematoma. Spleen is normal in size. There is a indeterminate hypodense lesion along the left lateral margin of the spleen as well as the splenic hilum, unchanged from prior exam. 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 The right adrenal glands poorly visualized. There is no evidence of hydronephrosis. There are bilateral renal stones. Bilateral kidneys are small in size and demonstrate slightly heterogeneous contrast enhancement. There are multiple hypodense lesions within bilateral kidneys which appear grossly unchanged compared to prior exam, likely represent  small renal cysts. Some lesions are too small to accurately characterize, for example at the upper pole of the right kidney. Stomach/B1owel: The stomach, small bowel,  large bowel are normal in caliber. No focal wall thickening. No evidence of bowel obstruction. Diverticulosis without evidence of diverticulitis. Appendix is normal in appearance. There is severe atherosclerotic plaque involving the mesenteric vasculature. No convincing evidence of stenosis or occlusion. Vascular/Lymphatic: There is extensive atherosclerotic plaque of the abdominal and pelvic vasculature. Reproductive: Uterus and bilateral adnexa are unremarkable. Other: No abdominal wall hernia or abnormality. No abdominopelvic ascites. There is persistent masslike thickening in the left breast measuring 4.2 x 3.2 cm (series 2, image 1). There is also overlying skin thickening. There are focal subcutaneous rounded regions of soft tissue thickening, image are favored to be injection related. Musculoskeletal: Moderate spinal canal narrowing at L4-L5. Severe bilateral neural foraminal narrowing at L4-L5 and L5-S1. IMPRESSION: 1. No acute abnormality in the abdomen or pelvis. 2. Persistent masslike thickening in the left breast measuring 4.2 x 3.2 cm with new overlying skin thickening. Findings remain worrisome for malignancy. 3. Severe bilateral neural foraminal narrowing at L4-L5 and L5-S1. Moderate spinal canal narrowing at L4-L5. Electronically Signed   By: Marin Roberts M.D.   On: 06/27/2022 16:11     Medications:    anticoagulant sodium citrate      apixaban  5 mg Oral BID   aspirin EC  81 mg Oral Daily   atorvastatin  80 mg Oral QPM   calcium acetate  2,001 mg Oral TID WC   Chlorhexidine Gluconate Cloth  6 each Topical Q0600   vitamin D3  1,000 Units Oral Daily   cinacalcet  30 mg Oral Q breakfast   fluticasone  1 spray Each Nare Daily   gabapentin  300 mg Oral BID   insulin aspart  0-6 Units Subcutaneous TID WC   insulin aspart  2 Units Subcutaneous TID WC   levETIRAcetam  1,000 mg Oral Daily   levETIRAcetam  250 mg Oral Once per day on Mon Wed Fri   losartan  100 mg Oral QHS   metoprolol  succinate  100 mg Oral Daily   pantoprazole  40 mg Oral Daily   pentafluoroprop-tetrafluoroeth       sertraline  25 mg Oral Daily   torsemide  100 mg Oral Daily   traZODone  50 mg Oral QHS   acetaminophen, alteplase, anticoagulant sodium citrate, heparin, ipratropium-albuterol, lidocaine (PF), lidocaine-prilocaine, pentafluoroprop-tetrafluoroeth, pentafluoroprop-tetrafluoroeth  Assessment/ Plan:  Cynthia Dean is a 71 y.o.  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 Patient presents to ED with complaints of bilateral lower extremity weakness and abdominal pain. She has been admitted for Abdominal Pain  [facility]/MWF/Lt AVG   Anemia of chronic kidney disease Lab Results  Component Value Date   HGB 7.6 (L) 06/27/2022   Hgb not at goal. Continue EPO with dialysis.   2. End stage renal disease on hemodialysis. Scheduled for dialysis today to maintain outpatient schedule.  Uf goal 1-1.5L as tolerated. Next treatment scheduled for Friday. Renal navigator monitoring discharge plan to assess outpatient needs.   3. Secondary Hyperparathyroidism: with outpatient labs: PTH 613, phosphorus 4.8, calcium 9.0 on 03/07/22.   Lab Results  Component Value Date   PTH 131 (H) 03/14/2018   CALCIUM 8.3 (L) 06/27/2022   PHOS 6.5 (H) 05/11/2022    Calcium stable.  Will continue to monitor.  4. Hypertension with chronic kidney disease. Home regimen includes losartan, metoprolol, and torsemide. Currently receiving these medications. Blood pressure during dialysis 171/67.     LOS: 0   12/6/20232:43 PM

## 2022-06-29 NOTE — Progress Notes (Signed)
Pre hd rn assessment 

## 2022-06-29 NOTE — ED Notes (Signed)
New oxygen tank provided. Pt remains on her chronic 2 L of oxygen via nasal cannula

## 2022-06-29 NOTE — ED Notes (Signed)
Pt placed on IP bed by [facility], RN and positioned for comfort. Meal tray delivered, pt not eating at this time.

## 2022-06-29 NOTE — ED Notes (Signed)
Pt transported to dialysis

## 2022-06-29 NOTE — ED Notes (Signed)
Pt sleeping. 

## 2022-06-29 NOTE — TOC Progression Note (Signed)
Transition of Care [facility]) - Progression Note    Patient Details  Name: Cynthia Dean MRN: [id number] Date of Birth: 12/14/1950  Transition of Care [facility]) CM/SW Contact  Shelbie Hutching, RN Phone Number: 06/29/2022, 3:37 PM  Clinical Narrative:    [facility] says that they will accept the patient back for rehab.  Need PT evaluation notes for insurance authorization, waiting for PT evaluation.    Expected Discharge Plan: Skilled Nursing Facility Barriers to Discharge: SNF Pending bed offer  Expected Discharge Plan and Services Expected Discharge Plan: [place]   Discharge Planning Services: CM Consult Post Acute Care Choice: [place] Living arrangements for the past 2 months: Single Family Home                 DME Arranged: N/A DME Agency: NA       HH Arranged: NA HH Agency: NA         Social Determinants of Health (SDOH) Interventions    Readmission Risk Interventions    10/25/2019    8:43 AM 10/24/2019   10:14 AM  Readmission Risk Prevention Plan  Transportation Screening Complete   PCP or Specialist Appt within 3-5 Days Not Complete   HRI or Home Care Consult Complete Complete  Palliative Care Screening Not Applicable   Medication Review (RN Care Manager) Complete

## 2022-06-29 NOTE — ED Notes (Signed)
Pt placed in hospital bed with no issue, pt able to stand and turn to sit on bed. Pt appreciative.

## 2022-06-29 NOTE — ED Notes (Signed)
Medication administered with assistance from [place], [place]. Pt moved to 1H. Verbal report given to receiving nurse.

## 2022-06-30 LAB — CBG MONITORING, ED
Glucose-Capillary: 145 mg/dL — ABNORMAL HIGH (ref 70–99)
Glucose-Capillary: 168 mg/dL — ABNORMAL HIGH (ref 70–99)
Glucose-Capillary: 134 mg/dL — ABNORMAL HIGH (ref 70–99)
Glucose-Capillary: 116 mg/dL — ABNORMAL HIGH (ref 70–99)

## 2022-06-30 NOTE — ED Notes (Signed)
OT at Pt bedside

## 2022-06-30 NOTE — ED Notes (Signed)
Pt is alert and pleasant. Was able to take night time medications. Prefers to take medications with applesauce. Pt remains on 2 L oxygen. Was repositioned in bed. Denies any additional needs. Pt visible from nurses station.

## 2022-06-30 NOTE — TOC Progression Note (Signed)
Transition of Care [facility]) - Progression Note    Patient Details  Name: Jewelia Bocchino MRN: [id number] Date of Birth: 1950/11/21  Transition of Care [facility]) CM/SW Contact  Shelbie Hutching, RN Phone Number: 06/30/2022, 12:39 PM  Clinical Narrative:    [facility] can accept patient.  PT and OT have worked with patient and recommending rehab.  Insurance authorization started.     Expected Discharge Plan: Skilled Nursing Facility Barriers to Discharge: SNF Pending bed offer  Expected Discharge Plan and Services Expected Discharge Plan: [place]   Discharge Planning Services: CM Consult Post Acute Care Choice: [place] Living arrangements for the past 2 months: Single Family Home                 DME Arranged: N/A DME Agency: NA       HH Arranged: NA HH Agency: NA         Social Determinants of Health (SDOH) Interventions    Readmission Risk Interventions    10/25/2019    8:43 AM 10/24/2019   10:14 AM  Readmission Risk Prevention Plan  Transportation Screening Complete   PCP or Specialist Appt within 3-5 Days Not Complete   HRI or Home Care Consult Complete Complete  Palliative Care Screening Not Applicable   Medication Review (RN Care Manager) Complete

## 2022-06-30 NOTE — Evaluation (Signed)
Physical Therapy Evaluation Patient Details Name: Cynthia Dean MRN: [id number] DOB: Jun 04, 1951 Today's Date: 06/30/2022  History of Present Illness  Charlet Kouns is a 47yoF who comes to [facility] on 06/27/22 c ABD pain. Pt recent returned to home from STR at [facility] and per family is very limited in mobility, family struggling to assist her 52. Per chart review, Pt has had multiple admissions here in past 2 years, frequently struggling to AMB meaningful distances when here. PMH: ESRD on HD MWF, HTN, HLD, DM, CAD, PE on eliquis, CHF, CVA c RLE weakness/foot drop, seizure, anemia.  Clinical Impression  Pt admitted with above Dx. Pt has functional limitations due to deficits below (see "PT Problem List"). Pt not able to provide much detail on baseline functional status, but does confirm RLE weakness and Rt foot drop are chronic conditions. Pt awake, but a little disoriented, generally very pleasant, speaking at times about her distress and crying last night. Today pt requires considerable physical assistance to perform bed mobility and transfers, Max +2, is really appropriate for mechanical lift at this time. Unclear how much progress pt made at [facility] after last admission, attempted to call facility and waiting for return call. Pt sat at EOB for 15 minutes in session, partakes in various exercises of legs, struggles to offer much force in coming to standing, but follows cues for safety. [facility] would have let pt eat her meal at EOB, but her ED gurney is not especially visible to staff and the combination of drowsiness and weakness, I decided to return to bed in 'chair position,' HOB >50 degrees with tray set up. Patient's performance this date reveals decreased ability, independence, and tolerance in performing all basic mobility required for performance of activities of daily living. Pt requires additional DME, close physical assistance, and cues for safe participate in mobility. Pt will benefit  from skilled PT intervention to increase independence and safety with basic mobility in preparation for discharge to the venue listed below.     Recommendations for follow up therapy are one component of a multi-disciplinary discharge planning process, led by the attending physician.  Recommendations may be updated based on patient status, additional functional criteria and insurance authorization.  Follow Up Recommendations Skilled nursing-short term rehab (<3 hours/day) Can patient physically be transported by private vehicle: No    Assistance Recommended at Discharge Intermittent Supervision/Assistance  Patient can return home with the following  Two people to help with walking and/or transfers;Two people to help with bathing/dressing/bathroom;Direct supervision/assist for medications management;Assist for transportation;Help with stairs or ramp for entrance;Assistance with feeding;A little help with bathing/dressing/bathroom;Assistance with cooking/housework    Equipment Recommendations None recommended by PT  Recommendations for Other Services       Functional Status Assessment Patient has had a recent decline in their functional status and demonstrates the ability to make significant improvements in function in a reasonable and predictable amount of time.     Precautions / Restrictions Precautions Precautions: Fall Restrictions Weight Bearing Restrictions: No      Mobility  Bed Mobility Overal bed mobility: Needs Assistance Bed Mobility: Supine to Sit, Sit to Supine     Supine to sit: +2 for physical assistance, Max assist, HOB elevated Sit to supine: +2 for physical assistance, Max assist        Transfers Overall transfer level: Needs assistance Equipment used: None Transfers: Sit to/from Stand Sit to Stand: Max assist, +2 physical assistance, From elevated surface  General transfer comment: maxA hug dependent transfer, pt attempts to follow commands  but has chronic RLE hemi weakness/foot drop, poor awareness of performance    Ambulation/Gait                  Stairs            Wheelchair Mobility    Modified Rankin (Stroke Patients Only)       Balance Overall balance assessment: No apparent balance deficits (not formally assessed)                                           Pertinent Vitals/Pain      Home Living                          Prior Function                       Hand Dominance        Extremity/Trunk Assessment                Communication      Cognition Arousal/Alertness:  (drowsy and pleasant) Behavior During Therapy: WFL for tasks assessed/performed Overall Cognitive Status: Impaired/Different from baseline                                 General Comments: generally disoriented, difficulty answering questions regarding HPI        General Comments      Exercises Total Joint Exercises Long Arc Quad: AROM, AAROM, Left, Right, 10 reps, Seated, Limitations Long Arc Quad Limitations: notably weaker RLE, trequires active assistance of limb Marching in Standing: Seated, AAROM, Both, 5 reps, Limitations Marching in Standing Limitations: severe trunk compensation when attempted on Rt due to hip weakness, acut edifficulty with trunk control   Assessment/Plan    PT Assessment Patient needs continued PT services  PT Problem List Decreased strength;Decreased range of motion;Decreased activity tolerance;Decreased balance;Decreased mobility;Decreased cognition;Decreased safety awareness;Decreased knowledge of precautions;Obesity       PT Treatment Interventions DME instruction;Gait training;Stair training;Functional mobility training;Therapeutic activities;Therapeutic exercise;Balance training;Cognitive remediation;Patient/family education    PT Goals (Current goals can be found in the Care Plan section)  Acute Rehab PT Goals Patient  Stated Goal: regain strength so family can assist her at home PT Goal Formulation: With patient Time For Goal Achievement: 07/14/22 Potential to Achieve Goals: Good    Frequency Min 2X/week     Co-evaluation               AM-PAC PT "6 Clicks" Mobility  Outcome Measure Help needed turning from your back to your side while in a flat bed without using bedrails?: Total Help needed moving from lying on your back to sitting on the side of a flat bed without using bedrails?: Total Help needed moving to and from a bed to a chair (including a wheelchair)?: Total Help needed standing up from a chair using your arms (e.g., wheelchair or bedside chair)?: Total Help needed to walk in hospital room?: Total Help needed climbing 3-5 steps with a railing? : Total 6 Click Score: 6    End of Session Equipment Utilized During Treatment: Oxygen Activity Tolerance: Patient tolerated treatment well;No increased pain;Patient limited by lethargy Patient left: in bed;with call bell/phone within  reach;with bed alarm set (in ED hall) Nurse Communication: Mobility status PT Visit Diagnosis: Muscle weakness (generalized) (M62.81);Other abnormalities of gait and mobility (R26.89)    Time: 2979-8921 PT Time Calculation (min) (ACUTE ONLY): 32 min   Charges:   PT Evaluation $PT Eval Moderate Complexity: 1 Mod PT Treatments $Therapeutic Activity: 8-22 mins       9:39 AM, 06/30/22 Etta Grandchild, PT, DPT Physical Therapist - [facility]  [phone] ([place])    Hanya Guerin C 06/30/2022, 9:32 AM

## 2022-06-30 NOTE — TOC Progression Note (Signed)
Transition of Care [facility]) - Progression Note    Patient Details  Name: [name] MRN: [id number] Date of Birth: 1950-12-14  Transition of Care [facility]) CM/SW Contact  Shelbie Hutching, RN Phone Number: 06/30/2022, 4:35 PM  Clinical Narrative:    Insurance authorization still pending.  Updated daughter, Joelene Millin.  [facility] said that [facility] could accept the patient when auth came back.    Expected Discharge Plan: Skilled Nursing Facility Barriers to Discharge: SNF Pending bed offer  Expected Discharge Plan and Services Expected Discharge Plan: [place]   Discharge Planning Services: CM Consult Post Acute Care Choice: [place] Living arrangements for the past 2 months: Single Family Home                 DME Arranged: N/A DME Agency: NA       HH Arranged: NA HH Agency: NA         Social Determinants of Health (SDOH) Interventions    Readmission Risk Interventions    10/25/2019    8:43 AM 10/24/2019   10:14 AM  Readmission Risk Prevention Plan  Transportation Screening Complete   PCP or Specialist Appt within 3-5 Days Not Complete   HRI or Home Care Consult Complete Complete  Palliative Care Screening Not Applicable   Medication Review (RN Care Manager) Complete

## 2022-06-30 NOTE — ED Notes (Signed)
Pt at bedside

## 2022-06-30 NOTE — ED Notes (Signed)
New oxygen tank provided. Pt continues to sleep.

## 2022-06-30 NOTE — ED Notes (Signed)
Spoke with pt's daughter, to give an update on pt's plan of care.

## 2022-06-30 NOTE — ED Provider Notes (Signed)
-----------------------------------------   4:11 AM on 06/30/2022 -----------------------------------------   Blood pressure (!) 163/68, pulse 65, temperature 98.5 F (36.9 C), temperature source Oral, resp. rate 16, height '5\' 9"'$  (1.753 m), weight 109.7 kg, SpO2 100 %.  The patient is calm and cooperative at this time.  There have been no acute events since the last update.  Awaiting disposition plan from case management/social work.    Abigail Marsiglia, Delice Bison, DO 06/30/22 [phone]

## 2022-06-30 NOTE — Evaluation (Signed)
Occupational Therapy Evaluation Patient Details Name: Cynthia Dean MRN: [id number] DOB: [birth year] Today's Date: 06/30/2022   History of Present Illness Pt is a 71 year old female presenting to the ED with  2 days of constant bilateral lower quadrant abdominal pain causing her to miss dialysis; PMH significant for hypertension, hyperlipidemia, diabetes, CAD, PE on Eliquis, diastolic CHF, ESRD on HD (MWF), stroke, seizure, and anemia   Clinical Impression   Chart reviewed, pt greeted in bed in ED hallway agreeable to OT evaluation. Pt is oriented to self only, unaware she is in the hospital. Pt falling asleep seated on edge of bed during evaluation. Per chart, recent discharge from rehab home, difficulties managing ADL/IADL/mobility at home. Pt presents with deficits in strength, endurance, activity tolerance, balance cognition affecting safe and optimal ADL completion. MAX A required for bed mobility, Fair static sitting balance at edge of bed with L lateral lean. Anticipate MAX A for all ADL tasks. unable to lift off bed with +1 with RW with MAX A despite bed raised and multi modal cueing. Recommend discharge to STR to address functional deficits. OT will continue to follow acutely.      Recommendations for follow up therapy are one component of a multi-disciplinary discharge planning process, led by the attending physician.  Recommendations may be updated based on patient status, additional functional criteria and insurance authorization.   Follow Up Recommendations  Skilled nursing-short term rehab (<3 hours/day)     Assistance Recommended at Discharge Frequent or constant Supervision/Assistance  Patient can return home with the following Two people to help with walking and/or transfers;Two people to help with bathing/dressing/bathroom    Functional Status Assessment  Patient has had a recent decline in their functional status and demonstrates the ability to make significant  improvements in function in a reasonable and predictable amount of time.  Equipment Recommendations  Other (comment) (per next venue of care)    Recommendations for Other Services       Precautions / Restrictions Precautions Precautions: Fall Restrictions Weight Bearing Restrictions: No      Mobility Bed Mobility Overal bed mobility: Needs Assistance Bed Mobility: Supine to Sit, Sit to Supine     Supine to sit: Max assist, HOB elevated Sit to supine: Max assist        Transfers Overall transfer level: Needs assistance Equipment used: Rolling walker (2 wheels) Transfers: Sit to/from Stand Sit to Stand: Max assist, Total assist           General transfer comment: pt able to lift less than 3 inches off bed with +1 assist      Balance Overall balance assessment: Needs assistance Sitting-balance support: Feet supported Sitting balance-Leahy Scale: Fair   Postural control: Left lateral lean Standing balance support: Reliant on assistive device for balance, During functional activity Standing balance-Leahy Scale: Zero                             ADL either performed or assessed with clinical judgement   ADL Overall ADL's : Needs assistance/impaired     Grooming: Wash/dry face;Moderate assistance;Sitting Grooming Details (indicate cue type and reason): anticipated             Lower Body Dressing: Maximal assistance;Sitting/lateral leans       Toileting- Clothing Manipulation and Hygiene: Maximal assistance;Sitting/lateral lean               Vision Patient Visual Report: No change from  baseline       Perception     Praxis      Pertinent Vitals/Pain Pain Assessment Pain Assessment: No/denies pain     Hand Dominance     Extremity/Trunk Assessment Upper Extremity Assessment Upper Extremity Assessment: Generalized weakness   Lower Extremity Assessment Lower Extremity Assessment: Generalized weakness       Communication  Communication Communication: No difficulties   Cognition Arousal/Alertness: Lethargic Behavior During Therapy: Flat affect Overall Cognitive Status: No family/caregiver present to determine baseline cognitive functioning Area of Impairment: Orientation, Attention, Memory, Following commands, Safety/judgement, Awareness, Problem solving                 Orientation Level: Disoriented to, Place, Time, Situation ("I'm at home") Current Attention Level: Focused Memory: Decreased short-term memory Following Commands: Follows one step commands with increased time Safety/Judgement: Decreased awareness of safety, Decreased awareness of deficits Awareness: Intellectual Problem Solving: Slow processing, Decreased initiation, Difficulty sequencing, Requires verbal cues, Requires tactile cues General Comments: falling asleep during evaluation     General Comments  vital signs monitored, stable throughout; Pt on 2 L via [place] throughout    Exercises     Shoulder Instructions      Home Living Family/patient expects to be discharged to:: Skilled nursing facility                                 Additional Comments: per chart pt was at home from recent rehab discharge, open to returning to rehab;      Prior Functioning/Environment               Mobility Comments: pt reports amb with RW short distances ADLs Comments: assist for ADL/IADL        OT Problem List: Decreased strength;Decreased activity tolerance;Impaired balance (sitting and/or standing);Decreased cognition;Decreased safety awareness;Decreased knowledge of use of DME or AE      OT Treatment/Interventions: Self-care/ADL training;Patient/family education;Therapeutic exercise;Balance training;Energy conservation;Therapeutic activities;DME and/or AE instruction    OT Goals(Current goals can be found in the care plan section) Acute Rehab OT Goals Patient Stated Goal: go to rehab OT Goal Formulation: With  patient Time For Goal Achievement: 07/14/22 Potential to Achieve Goals: Good ADL Goals Pt Will Perform Grooming: with min assist;sitting Pt Will Perform Lower Body Dressing: with mod assist;sitting/lateral leans;sit to/from stand Pt Will Transfer to Toilet: with mod assist;stand pivot transfer Pt Will Perform Toileting - Clothing Manipulation and hygiene: with mod assist;sitting/lateral leans  OT Frequency: Min 2X/week    Co-evaluation              AM-PAC OT "6 Clicks" Daily Activity     Outcome Measure Help from another person eating meals?: A Little Help from another person taking care of personal grooming?: A Little Help from another person toileting, which includes using toliet, bedpan, or urinal?: A Lot Help from another person bathing (including washing, rinsing, drying)?: A Lot Help from another person to put on and taking off regular upper body clothing?: A Little Help from another person to put on and taking off regular lower body clothing?: A Lot 6 Click Score: 15   End of Session Equipment Utilized During Treatment: Rolling walker (2 wheels);Oxygen Nurse Communication: Mobility status  Activity Tolerance: Patient tolerated treatment well Patient left: in bed;with call bell/phone within reach;with bed alarm set  OT Visit Diagnosis: Unsteadiness on feet (R26.81);Muscle weakness (generalized) (M62.81)  Time: 1033-1050 OT Time Calculation (min): 17 min Charges:  OT General Charges $OT Visit: 1 Visit OT Evaluation $OT Eval Low Complexity: 1 Low  Shanon Payor, OTD OTR/L  06/30/22, 11:47 AM

## 2022-07-01 LAB — CBG MONITORING, ED
Glucose-Capillary: 116 mg/dL — ABNORMAL HIGH (ref 70–99)
Glucose-Capillary: 143 mg/dL — ABNORMAL HIGH (ref 70–99)
Glucose-Capillary: 117 mg/dL — ABNORMAL HIGH (ref 70–99)

## 2022-07-01 MED ORDER — GABAPENTIN 300 MG PO CAPS: 300.0000 mg | ORAL_CAPSULE | Freq: Two times a day (BID) | ORAL | 0 refills | Status: DC

## 2022-07-01 MED ORDER — NOVOLIN 70/30 (70-30) 100 UNIT/ML ~~LOC~~ SUSP: 28.0000 [IU] | Freq: Two times a day (BID) | SUBCUTANEOUS | 1 refills | Status: DC

## 2022-07-01 MED ORDER — ACETAMINOPHEN 500 MG PO TABS: 500.0000 mg | ORAL_TABLET | Freq: Four times a day (QID) | ORAL | 1 refills | Status: DC | PRN

## 2022-07-01 MED ORDER — IPRATROPIUM-ALBUTEROL 20-100 MCG/ACT IN AERS: 1.0000 | INHALATION_SPRAY | Freq: Four times a day (QID) | RESPIRATORY_TRACT | 0 refills | Status: DC

## 2022-07-01 MED ORDER — LEVETIRACETAM 250 MG PO TABS: 250.0000 mg | ORAL_TABLET | ORAL | 0 refills | Status: DC

## 2022-07-01 MED ORDER — TRAZODONE HCL 50 MG PO TABS: 50.0000 mg | ORAL_TABLET | Freq: Every day | ORAL | 0 refills | Status: DC

## 2022-07-01 MED ORDER — OMEPRAZOLE 20 MG PO CPDR: 20.0000 mg | DELAYED_RELEASE_CAPSULE | Freq: Two times a day (BID) | ORAL | 0 refills | Status: DC

## 2022-07-01 MED ORDER — LOPERAMIDE HCL 2 MG PO CAPS: 2.0000 mg | ORAL_CAPSULE | Freq: Four times a day (QID) | ORAL | 0 refills | Status: AC | PRN

## 2022-07-01 MED ORDER — LIDOCAINE 5 % EX PTCH: 1.0000 | MEDICATED_PATCH | CUTANEOUS | 0 refills | Status: DC

## 2022-07-01 MED ORDER — TORSEMIDE 100 MG PO TABS: 100.0000 mg | ORAL_TABLET | Freq: Every day | ORAL | 0 refills | Status: DC

## 2022-07-01 MED ORDER — FLUTICASONE PROPIONATE 50 MCG/ACT NA SUSP: 1.0000 | Freq: Every day | NASAL | 0 refills | Status: DC

## 2022-07-01 MED ORDER — LIDOCAINE-PRILOCAINE 2.5-2.5 % EX CREA: 1.0000 | TOPICAL_CREAM | CUTANEOUS | 0 refills | Status: AC | PRN

## 2022-07-01 MED ORDER — METHYLPREDNISOLONE 4 MG PO TABS: 4.0000 mg | ORAL_TABLET | Freq: Every day | ORAL | 0 refills | Status: DC

## 2022-07-01 MED ORDER — DIPHENOXYLATE-ATROPINE 2.5-0.025 MG PO TABS: ORAL_TABLET | ORAL | 1 refills | Status: DC

## 2022-07-01 MED ORDER — PENTAFLUOROPROP-TETRAFLUOROETH EX AERO
INHALATION_SPRAY | CUTANEOUS | Status: AC
  Filled 2022-07-01: qty 30

## 2022-07-01 MED ORDER — CINACALCET HCL 30 MG PO TABS: 30.0000 mg | ORAL_TABLET | Freq: Every day | ORAL | 1 refills | Status: DC

## 2022-07-01 MED ORDER — SERTRALINE HCL 25 MG PO TABS: 25.0000 mg | ORAL_TABLET | Freq: Every day | ORAL | 0 refills | Status: DC

## 2022-07-01 MED ORDER — METOPROLOL SUCCINATE ER 100 MG PO TB24: 100.0000 mg | ORAL_TABLET | Freq: Every day | ORAL | 0 refills | Status: DC

## 2022-07-01 MED ORDER — VITAMIN D3 25 MCG PO TABS: 1000.0000 [IU] | ORAL_TABLET | Freq: Every day | ORAL | 1 refills | Status: DC

## 2022-07-01 MED ORDER — ASPIRIN 81 MG PO TBEC: 81.0000 mg | DELAYED_RELEASE_TABLET | Freq: Every day | ORAL | 1 refills | Status: DC

## 2022-07-01 MED ORDER — CALCIUM ACETATE (PHOS BINDER) 667 MG PO CAPS: 2001.0000 mg | ORAL_CAPSULE | Freq: Three times a day (TID) | ORAL | 0 refills | Status: AC

## 2022-07-01 MED ORDER — APIXABAN 5 MG PO TABS: 5.0000 mg | ORAL_TABLET | Freq: Two times a day (BID) | ORAL | 0 refills | Status: DC

## 2022-07-01 MED ORDER — LOSARTAN POTASSIUM 100 MG PO TABS: 100.0000 mg | ORAL_TABLET | Freq: Every day | ORAL | 0 refills | Status: DC

## 2022-07-01 MED ORDER — GLUCOSE 4 G PO CHEW: 1.0000 | CHEWABLE_TABLET | ORAL | 1 refills | Status: DC | PRN

## 2022-07-01 MED ORDER — ATORVASTATIN CALCIUM 80 MG PO TABS: 80.0000 mg | ORAL_TABLET | Freq: Every evening | ORAL | 0 refills | Status: DC

## 2022-07-01 MED ORDER — LEVETIRACETAM 1000 MG PO TABS: 1000.0000 mg | ORAL_TABLET | Freq: Every day | ORAL | 0 refills | Status: DC

## 2022-07-01 NOTE — ED Provider Notes (Signed)
-----------------------------------------   2:28 AM on 07/01/2022 -----------------------------------------   Blood pressure (!) 155/76, pulse 69, temperature 98.5 F (36.9 C), temperature source Oral, resp. rate 16, height 1.753 m ('5\' 9"'$ ), weight 109.7 kg, SpO2 100 %.  The patient is calm and cooperative at this time.  There have been no acute events since the last update.  Awaiting disposition plan from [facility] team.   Hinda Kehr, MD 07/01/22 [phone]

## 2022-07-01 NOTE — Progress Notes (Signed)
Patient will go to facility after HD, Nurse has DC information.

## 2022-07-01 NOTE — ED Notes (Signed)
[facility]  CALLED  TO  TRANSPORT  PT  TO  [facility]  RM [phone]

## 2022-07-01 NOTE — ED Notes (Signed)
While receiving report from off going nurse, pt observed attempting to get out of bed. Pt easily redirected and assisted back into bed and positioned for comfort. Pt now resting in bed free from sign of distress. Breathing unlabored speaking in full sentences with symmetric chest rise and fall. Bed is low and locked with side rails raised and in direct view of nurses station. Bed alarm is in use. Pt denies pain or other needs at this time.

## 2022-07-01 NOTE — ED Notes (Signed)
Report and paperwork given to [facility]

## 2022-07-01 NOTE — ED Notes (Signed)
Patient resting in bed free from sign of distress. Breathing unlabored  with symmetric chest rise and fall. Bed low and locked with side rails raised x2. Call bell in reach.

## 2022-07-01 NOTE — ED Provider Notes (Signed)
Emergency Medicine Observation Re-evaluation Note  Cynthia Dean is a 71 y.o. female, seen on rounds today.  Pt initially presented to the ED for complaints of Abdominal Pain Currently, the patient is resting comfortably.  Physical Exam  BP 132/70 (BP Location: Right Arm)   Pulse 65   Temp 98.8 F (37.1 C) (Oral)   Resp 15   Ht '5\' 9"'$  (1.753 m)   Wt 109.5 kg   SpO2 95%   BMI 35.65 kg/m  Physical Exam General: No acute distress Cardiac: Well-perfused extremities Lungs: No respiratory distress Psych: Appropriate mood and affect  ED Course / MDM  EKG:EKG Interpretation  Date/Time:  Monday June 27 2022 06:15:35 EST Ventricular Rate:  67 PR Interval:  182 QRS Duration: 86 QT Interval:  450 QTC Calculation: 475 R Axis:   28 Text Interpretation: Normal sinus rhythm Nonspecific T wave abnormality Prolonged QT Abnormal ECG When compared with ECG of 19-Mar-2022 22:06, PREVIOUS ECG IS PRESENT Confirmed by UNCONFIRMED, DOCTOR ([id number]), editor Dwaine Deter [phone]) on 06/27/2022 12:47:47 PM  I have reviewed the labs performed to date as well as medications administered while in observation.  Recent changes in the last 24 hours include none.  Plan  Current plan is for outpatient skilled nursing facility.    Naaman Plummer, MD 07/01/22 [phone]

## 2022-07-01 NOTE — ED Notes (Signed)
Report given to [place] at [employer].

## 2022-07-01 NOTE — Progress Notes (Signed)
[facility]  ROUNDING NOTE   Subjective:   Cynthia Dean is a 71 y.o. 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 Patient presents to ED with complaints of bilateral lower extremity weakness and abdominal pain. She has been admitted for Abdominal Pain  Patient is known to our practice and receives outpatient dialysis treatments at [facility] on a MWF schedule, supervised by Dr. Holley Raring.    Patient seen sitting up in bed, currently eating breakfast Alert and oriented No complaints to offer Remains on room air No lower extremity edema   Objective:  Vital signs in last 24 hours:  Temp:  [98.5 F (36.9 C)-98.8 F (37.1 C)] 98.8 F (37.1 C) (12/08 1405) Pulse Rate:  [64-85] 66 (12/08 1500) Resp:  [14-16] 14 (12/08 1500) BP: (132-155)/(63-76) 134/65 (12/08 1500) SpO2:  [95 %-100 %] 99 % (12/08 1500) Weight:  [109.5 kg] 109.5 kg (12/08 1416)  Weight change:  Filed Weights   06/27/22 0610 07/01/22 1416  Weight: 109.7 kg 109.5 kg    Intake/Output: No intake/output data recorded.   Intake/Output this shift:  No intake/output data recorded.  Physical Exam: General: NAD  Head: Normocephalic, atraumatic. Moist oral mucosal membranes  Eyes: Anicteric  Lungs:  Clear to auscultation  Heart: Regular rate and rhythm  Abdomen:  Soft, nontender,   Extremities: No peripheral edema.  Neurologic: Nonfocal, moving all four extremities  Skin: No lesions  Access: Lt pErmcath    Basic Metabolic Panel: Recent Labs  Lab 06/27/22 0611  NA 139  K 4.6  CL 101  CO2 22  GLUCOSE 174*  BUN 57*  CREATININE 9.45*  CALCIUM 8.3*     Liver Function Tests: Recent Labs  Lab 06/27/22 0611  AST 12*  ALT 11  ALKPHOS 107  BILITOT 0.7  PROT 7.4  ALBUMIN 3.4*    Recent Labs  Lab 06/27/22 0611  LIPASE 56*    No results for  input(s): "AMMONIA" in the last 168 hours.  CBC: Recent Labs  Lab 06/27/22 0611  WBC 11.0*  HGB 7.6*  HCT 25.3*  MCV 87.2  PLT 188     Cardiac Enzymes: No results for input(s): "CKTOTAL", "CKMB", "CKMBINDEX", "TROPONINI" in the last 168 hours.  BNP: Invalid input(s): "POCBNP"  CBG: Recent Labs  Lab 06/30/22 1202 06/30/22 1644 06/30/22 2100 07/01/22 0831 07/01/22 1213  GLUCAP 168* 134* 116* 116* 143*     Microbiology: Results for orders placed or performed during the hospital encounter of 06/27/22  Resp Panel by RT-PCR (Flu A&B, Covid) Anterior Nasal Swab     Status: None   Collection Time: 06/27/22  6:52 PM   Specimen: Anterior Nasal Swab  Result Value Ref Range Status   SARS Coronavirus 2 by RT PCR NEGATIVE NEGATIVE Final    Comment: (NOTE) SARS-CoV-2 target nucleic acids are NOT DETECTED.  The SARS-CoV-2 RNA is generally detectable in upper respiratory specimens during the acute phase of infection. The lowest concentration of SARS-CoV-2 viral copies this assay can detect is 138 copies/mL. A negative result does not preclude SARS-Cov-2 infection and should not be used as the sole basis for treatment or other patient management decisions. A negative result may occur with  improper specimen collection/handling, submission of specimen other than nasopharyngeal swab, presence of viral mutation(s) within the areas targeted by this assay, and inadequate number of viral copies(<138 copies/mL). A negative result must be combined with  clinical observations, patient history, and epidemiological information. The expected result is Negative.  Fact Sheet for Patients:  EntrepreneurPulse.com.au  Fact Sheet for Healthcare Providers:  IncredibleEmployment.be  This test is no t yet approved or cleared by the [place] FDA and  has been authorized for detection and/or diagnosis of SARS-CoV-2 by FDA under an Emergency Use  Authorization (EUA). This EUA will remain  in effect (meaning this test can be used) for the duration of the COVID-19 declaration under Section 564(b)(1) of the Act, 21 U.S.C.section 360bbb-3(b)(1), unless the authorization is terminated  or revoked sooner.       Influenza A by PCR NEGATIVE NEGATIVE Final   Influenza B by PCR NEGATIVE NEGATIVE Final    Comment: (NOTE) The Xpert Xpress SARS-CoV-2/FLU/RSV plus assay is intended as an aid in the diagnosis of influenza from Nasopharyngeal swab specimens and should not be used as a sole basis for treatment. Nasal washings and aspirates are unacceptable for Xpert Xpress SARS-CoV-2/FLU/RSV testing.  Fact Sheet for Patients: EntrepreneurPulse.com.au  Fact Sheet for Healthcare Providers: IncredibleEmployment.be  This test is not yet approved or cleared by the [place] FDA and has been authorized for detection and/or diagnosis of SARS-CoV-2 by FDA under an Emergency Use Authorization (EUA). This EUA will remain in effect (meaning this test can be used) for the duration of the COVID-19 declaration under Section 564(b)(1) of the Act, 21 U.S.C. section 360bbb-3(b)(1), unless the authorization is terminated or revoked.  Performed at [facility], [address] [place], [place] [address]     Coagulation Studies: No results for input(s): "LABPROT", "INR" in the last 72 hours.  Urinalysis: No results for input(s): "COLORURINE", "LABSPEC", "PHURINE", "GLUCOSEU", "HGBUR", "BILIRUBINUR", "KETONESUR", "PROTEINUR", "UROBILINOGEN", "NITRITE", "LEUKOCYTESUR" in the last 72 hours.  Invalid input(s): "APPERANCEUR"    Imaging: No results found.   Medications:    anticoagulant sodium citrate      apixaban  5 mg Oral BID   aspirin EC  81 mg Oral Daily   atorvastatin  80 mg Oral QPM   calcium acetate  2,001 mg Oral TID WC   Chlorhexidine Gluconate Cloth  6 each Topical Q0600   vitamin D3   1,000 Units Oral Daily   cinacalcet  30 mg Oral Q breakfast   epoetin (EPOGEN/PROCRIT) injection  10,000 Units Intravenous Q M,W,F-HD   fluticasone  1 spray Each Nare Daily   gabapentin  300 mg Oral BID   insulin aspart  0-6 Units Subcutaneous TID WC   insulin aspart  2 Units Subcutaneous TID WC   levETIRAcetam  1,000 mg Oral Daily   levETIRAcetam  250 mg Oral Once per day on Mon Wed Fri   losartan  100 mg Oral QHS   metoprolol succinate  100 mg Oral Daily   pantoprazole  40 mg Oral Daily   pentafluoroprop-tetrafluoroeth       sertraline  25 mg Oral Daily   torsemide  100 mg Oral Daily   traZODone  50 mg Oral QHS   acetaminophen, alteplase, anticoagulant sodium citrate, heparin, ipratropium-albuterol, lidocaine (PF), lidocaine-prilocaine, pentafluoroprop-tetrafluoroeth, pentafluoroprop-tetrafluoroeth  Assessment/ Plan:  Ms. Alzora Ha is a 71 y.o.  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 Patient presents to ED with complaints of bilateral lower extremity weakness and abdominal pain. She has been admitted for Abdominal Pain  [facility]/MWF/Lt AVG   Anemia of chronic kidney disease Lab  Results  Component Value Date   HGB 7.6 (L) 06/27/2022   Hemoglobin remains below target.  Continue EPO with dialysis.   2. End stage renal disease on hemodialysis. Scheduled to receive dialysis today, UF goal 1 L as tolerated.  Next treatment scheduled for Monday.  3. Secondary Hyperparathyroidism: with outpatient labs: PTH 613, phosphorus 4.8, calcium 9.0 on 03/07/22.   Lab Results  Component Value Date   PTH 131 (H) 03/14/2018   CALCIUM 8.3 (L) 06/27/2022   PHOS 6.5 (H) 05/11/2022    Calcium within acceptable range.  4. Hypertension with chronic kidney disease. Home regimen includes losartan, metoprolol, and torsemide. Currently  receiving these medications.  Blood pressure acceptable for this patient.     LOS: 0   12/8/20233:26 PM

## 2022-07-01 NOTE — Progress Notes (Signed)
   07/01/22 1801  Vitals  Temp 98.5 F (36.9 C)  Temp Source Oral  BP (!) 140/65  MAP (mmHg) 86  BP Location Right Arm  BP Method Automatic  Patient Position (if appropriate) Lying  Pulse Rate 65  Pulse Rate Source Monitor  ECG Heart Rate 65  Resp 16  Oxygen Therapy  SpO2 99 %  O2 Device Nasal Cannula  O2 Flow Rate (L/min) 2 L/min  Patient Activity (if Appropriate) In bed  Pulse Oximetry Type Continuous  During Treatment Monitoring  Blood Flow Rate (mL/min) 200 mL/min  HD Safety Checks Performed Yes  Intra-Hemodialysis Comments Tx completed  Post Treatment  Dialyzer Clearance Lightly streaked  Duration of HD Treatment -hour(s) 3.5 hour(s)  Hemodialysis Intake (mL) 0 mL  Liters Processed 64  Fluid Removed (mL) 1500 mL  Tolerated HD Treatment Yes  Post-Hemodialysis Comments hd tx completed. no complications.  AVG/AVF Arterial Site Held (minutes) 10 minutes  AVG/AVF Venous Site Held (minutes) 10 minutes  Fistula / Graft Left Upper arm Arteriovenous vein graft  No placement date or time found.   Placed prior to admission: Yes  Orientation: Left  Access Location: Upper arm  Access Type: Arteriovenous vein graft  Site Condition No complications  Fistula / Graft Assessment Present;Thrill;Bruit  Status Deaccessed  Needle Size 15  Drainage Description None

## 2022-07-01 NOTE — Progress Notes (Signed)
CSW has reached out to the nurse about the [facility]'s DC [facility]. The bed number is 607 A and the report number is [phone]. The facility can accept the patient at 2pm today. TOC will follow DC needs.

## 2022-07-01 NOTE — ED Notes (Addendum)
Pt brief changed, provided peri care with this RN and RN Doroteo Bradford.  Cleaned face and hands, provided new gown and linens.  Pt tolerated but became irritable towards end of grooming.  Bed locked in lowest position, NAD.

## 2022-08-26 ENCOUNTER — Other Ambulatory Visit
Admission: RE | Admit: 2022-08-26 | Discharge: 2022-08-26 | Disposition: A | Discharge status: Home / Self Care | Payer: 59 | Source: Ambulatory Visit | Attending: Internal Medicine | Admitting: Internal Medicine

## 2022-08-26 ENCOUNTER — Encounter: Payer: Self-pay | Admitting: Emergency Medicine

## 2022-08-26 ENCOUNTER — Other Ambulatory Visit: Payer: Self-pay

## 2022-08-26 ENCOUNTER — Inpatient Hospital Stay
Admission: EM | Admit: 2022-08-26 | Discharge: 2022-09-09 | DRG: 377 | Disposition: A | Discharge status: Skilled Nursing Facility | Payer: 59 | Attending: Internal Medicine | Admitting: Internal Medicine

## 2022-08-26 DIAGNOSIS — D500 Iron deficiency anemia secondary to blood loss (chronic): Secondary | ICD-10-CM | POA: Diagnosis not present

## 2022-08-26 DIAGNOSIS — K640 First degree hemorrhoids: Secondary | ICD-10-CM | POA: Diagnosis present

## 2022-08-26 DIAGNOSIS — N186 End stage renal disease: Secondary | ICD-10-CM | POA: Diagnosis present

## 2022-08-26 DIAGNOSIS — F32A Depression, unspecified: Secondary | ICD-10-CM | POA: Diagnosis present

## 2022-08-26 DIAGNOSIS — E1140 Type 2 diabetes mellitus with diabetic neuropathy, unspecified: Secondary | ICD-10-CM | POA: Diagnosis present

## 2022-08-26 DIAGNOSIS — Z992 Dependence on renal dialysis: Secondary | ICD-10-CM

## 2022-08-26 DIAGNOSIS — Z823 Family history of stroke: Secondary | ICD-10-CM

## 2022-08-26 DIAGNOSIS — Z8673 Personal history of transient ischemic attack (TIA), and cerebral infarction without residual deficits: Secondary | ICD-10-CM

## 2022-08-26 DIAGNOSIS — J449 Chronic obstructive pulmonary disease, unspecified: Secondary | ICD-10-CM | POA: Diagnosis present

## 2022-08-26 DIAGNOSIS — I69320 Aphasia following cerebral infarction: Secondary | ICD-10-CM

## 2022-08-26 DIAGNOSIS — K219 Gastro-esophageal reflux disease without esophagitis: Secondary | ICD-10-CM | POA: Diagnosis present

## 2022-08-26 DIAGNOSIS — Z9981 Dependence on supplemental oxygen: Secondary | ICD-10-CM

## 2022-08-26 DIAGNOSIS — D631 Anemia in chronic kidney disease: Secondary | ICD-10-CM | POA: Insufficient documentation

## 2022-08-26 DIAGNOSIS — Z6834 Body mass index (BMI) 34.0-34.9, adult: Secondary | ICD-10-CM

## 2022-08-26 DIAGNOSIS — E119 Type 2 diabetes mellitus without complications: Secondary | ICD-10-CM

## 2022-08-26 DIAGNOSIS — Z8719 Personal history of other diseases of the digestive system: Secondary | ICD-10-CM

## 2022-08-26 DIAGNOSIS — Z7901 Long term (current) use of anticoagulants: Secondary | ICD-10-CM

## 2022-08-26 DIAGNOSIS — Z7951 Long term (current) use of inhaled steroids: Secondary | ICD-10-CM

## 2022-08-26 DIAGNOSIS — I132 Hypertensive heart and chronic kidney disease with heart failure and with stage 5 chronic kidney disease, or end stage renal disease: Secondary | ICD-10-CM | POA: Diagnosis present

## 2022-08-26 DIAGNOSIS — R710 Precipitous drop in hematocrit: Secondary | ICD-10-CM | POA: Diagnosis present

## 2022-08-26 DIAGNOSIS — Z87891 Personal history of nicotine dependence: Secondary | ICD-10-CM

## 2022-08-26 DIAGNOSIS — N2581 Secondary hyperparathyroidism of renal origin: Secondary | ICD-10-CM | POA: Diagnosis present

## 2022-08-26 DIAGNOSIS — Z9012 Acquired absence of left breast and nipple: Secondary | ICD-10-CM

## 2022-08-26 DIAGNOSIS — G8191 Hemiplegia, unspecified affecting right dominant side: Secondary | ICD-10-CM | POA: Diagnosis not present

## 2022-08-26 DIAGNOSIS — Z853 Personal history of malignant neoplasm of breast: Secondary | ICD-10-CM

## 2022-08-26 DIAGNOSIS — Z8249 Family history of ischemic heart disease and other diseases of the circulatory system: Secondary | ICD-10-CM

## 2022-08-26 DIAGNOSIS — I10 Essential (primary) hypertension: Secondary | ICD-10-CM | POA: Diagnosis present

## 2022-08-26 DIAGNOSIS — Z888 Allergy status to other drugs, medicaments and biological substances status: Secondary | ICD-10-CM

## 2022-08-26 DIAGNOSIS — Z803 Family history of malignant neoplasm of breast: Secondary | ICD-10-CM

## 2022-08-26 DIAGNOSIS — K5731 Diverticulosis of large intestine without perforation or abscess with bleeding: Secondary | ICD-10-CM | POA: Diagnosis not present

## 2022-08-26 DIAGNOSIS — Z79899 Other long term (current) drug therapy: Secondary | ICD-10-CM

## 2022-08-26 DIAGNOSIS — K922 Gastrointestinal hemorrhage, unspecified: Secondary | ICD-10-CM

## 2022-08-26 DIAGNOSIS — D62 Acute posthemorrhagic anemia: Secondary | ICD-10-CM | POA: Diagnosis not present

## 2022-08-26 DIAGNOSIS — R4189 Other symptoms and signs involving cognitive functions and awareness: Secondary | ICD-10-CM | POA: Diagnosis present

## 2022-08-26 DIAGNOSIS — I2510 Atherosclerotic heart disease of native coronary artery without angina pectoris: Secondary | ICD-10-CM | POA: Diagnosis present

## 2022-08-26 DIAGNOSIS — Z7982 Long term (current) use of aspirin: Secondary | ICD-10-CM

## 2022-08-26 DIAGNOSIS — Z86711 Personal history of pulmonary embolism: Secondary | ICD-10-CM

## 2022-08-26 DIAGNOSIS — E1122 Type 2 diabetes mellitus with diabetic chronic kidney disease: Secondary | ICD-10-CM | POA: Diagnosis present

## 2022-08-26 DIAGNOSIS — I5032 Chronic diastolic (congestive) heart failure: Secondary | ICD-10-CM | POA: Diagnosis present

## 2022-08-26 DIAGNOSIS — E11319 Type 2 diabetes mellitus with unspecified diabetic retinopathy without macular edema: Secondary | ICD-10-CM | POA: Diagnosis present

## 2022-08-26 DIAGNOSIS — G40909 Epilepsy, unspecified, not intractable, without status epilepticus: Secondary | ICD-10-CM | POA: Diagnosis present

## 2022-08-26 DIAGNOSIS — Z794 Long term (current) use of insulin: Secondary | ICD-10-CM

## 2022-08-26 DIAGNOSIS — Z751 Person awaiting admission to adequate facility elsewhere: Secondary | ICD-10-CM

## 2022-08-26 DIAGNOSIS — J9611 Chronic respiratory failure with hypoxia: Secondary | ICD-10-CM | POA: Diagnosis present

## 2022-08-26 DIAGNOSIS — F0393 Unspecified dementia, unspecified severity, with mood disturbance: Secondary | ICD-10-CM | POA: Diagnosis present

## 2022-08-26 DIAGNOSIS — E785 Hyperlipidemia, unspecified: Secondary | ICD-10-CM | POA: Diagnosis present

## 2022-08-26 DIAGNOSIS — D649 Anemia, unspecified: Principal | ICD-10-CM

## 2022-08-26 DIAGNOSIS — Z8616 Personal history of COVID-19: Secondary | ICD-10-CM

## 2022-08-26 DIAGNOSIS — Z885 Allergy status to narcotic agent status: Secondary | ICD-10-CM

## 2022-08-26 LAB — COMPREHENSIVE METABOLIC PANEL
ALT: 7 U/L (ref 0–44)
AST: 13 U/L — ABNORMAL LOW (ref 15–41)
Albumin: 3.1 g/dL — ABNORMAL LOW (ref 3.5–5.0)
Alkaline Phosphatase: 89 U/L (ref 38–126)
Anion gap: 9 (ref 5–15)
BUN: 27 mg/dL — ABNORMAL HIGH (ref 8–23)
CO2: 27 mmol/L (ref 22–32)
Calcium: 8.5 mg/dL — ABNORMAL LOW (ref 8.9–10.3)
Chloride: 103 mmol/L (ref 98–111)
Creatinine, Ser: 5.48 mg/dL — ABNORMAL HIGH (ref 0.44–1.00)
GFR, Estimated: 8 mL/min — ABNORMAL LOW (ref >60)
Glucose, Bld: 149 mg/dL — ABNORMAL HIGH (ref 70–99)
Potassium: 3.9 mmol/L (ref 3.5–5.1)
Sodium: 139 mmol/L (ref 135–145)
Total Bilirubin: 0.6 mg/dL (ref 0.3–1.2)
Total Protein: 7.1 g/dL (ref 6.5–8.1)

## 2022-08-26 LAB — CBC
HCT: 20.8 % — ABNORMAL LOW (ref 36.0–46.0)
Hemoglobin: 6.2 g/dL — ABNORMAL LOW (ref 12.0–15.0)
MCH: 25.4 pg — ABNORMAL LOW (ref 26.0–34.0)
MCHC: 29.8 g/dL — ABNORMAL LOW (ref 30.0–36.0)
MCV: 85.2 fL (ref 80.0–100.0)
Platelets: 220 10*3/uL (ref 150–400)
RBC: 2.44 MIL/uL — ABNORMAL LOW (ref 3.87–5.11)
RDW: 18.2 % — ABNORMAL HIGH (ref 11.5–15.5)
WBC: 8 10*3/uL (ref 4.0–10.5)
nRBC: 0 % (ref 0.0–0.2)

## 2022-08-26 LAB — CBG MONITORING, ED: Glucose-Capillary: 135 mg/dL — ABNORMAL HIGH (ref 70–99)

## 2022-08-26 LAB — PREPARE RBC (CROSSMATCH)

## 2022-08-26 LAB — HEMOGLOBIN AND HEMATOCRIT, BLOOD
HCT: 19 % — ABNORMAL LOW (ref 36.0–46.0)
Hemoglobin: 5.7 g/dL — ABNORMAL LOW (ref 12.0–15.0)

## 2022-08-26 MED ORDER — INSULIN ASPART 100 UNIT/ML IJ SOLN
0.0000 [IU] | INTRAMUSCULAR | Status: DC
  Administered 2022-08-26 – 2022-08-27 (×3): 2 [IU] via SUBCUTANEOUS
  Administered 2022-08-28: 3 [IU] via SUBCUTANEOUS
  Filled 2022-08-26 (×4): qty 1

## 2022-08-26 MED ORDER — PANTOPRAZOLE 80MG IVPB - SIMPLE MED
80.0000 mg | Freq: Once | INTRAVENOUS | Status: AC
  Administered 2022-08-26: 80 mg via INTRAVENOUS
  Filled 2022-08-26: qty 100

## 2022-08-26 MED ORDER — ATORVASTATIN CALCIUM 20 MG PO TABS
80.0000 mg | ORAL_TABLET | Freq: Every evening | ORAL | Status: DC
  Administered 2022-08-27 – 2022-09-08 (×13): 80 mg via ORAL
  Filled 2022-08-26 (×13): qty 4

## 2022-08-26 MED ORDER — LEVETIRACETAM 250 MG PO TABS
250.0000 mg | ORAL_TABLET | ORAL | Status: DC
  Administered 2022-08-29 – 2022-09-09 (×6): 250 mg via ORAL
  Filled 2022-08-26 (×6): qty 1

## 2022-08-26 MED ORDER — ACETAMINOPHEN 650 MG RE SUPP: 650.0000 mg | Freq: Four times a day (QID) | RECTAL | Status: DC | PRN

## 2022-08-26 MED ORDER — HYDROCODONE-ACETAMINOPHEN 5-325 MG PO TABS
1.0000 | ORAL_TABLET | ORAL | Status: DC | PRN
  Administered 2022-08-29: 1 via ORAL
  Administered 2022-08-30: 2 via ORAL
  Administered 2022-08-31: 1 via ORAL
  Administered 2022-09-02 – 2022-09-04 (×2): 2 via ORAL
  Filled 2022-08-26: qty 2
  Filled 2022-08-26 (×2): qty 1
  Filled 2022-08-26 (×2): qty 2

## 2022-08-26 MED ORDER — GABAPENTIN 300 MG PO CAPS
300.0000 mg | ORAL_CAPSULE | Freq: Two times a day (BID) | ORAL | Status: DC
  Administered 2022-08-26 – 2022-09-02 (×13): 300 mg via ORAL
  Filled 2022-08-26 (×13): qty 1

## 2022-08-26 MED ORDER — PANTOPRAZOLE SODIUM 40 MG IV SOLR: 40.0000 mg | Freq: Two times a day (BID) | INTRAVENOUS | Status: DC

## 2022-08-26 MED ORDER — PANTOPRAZOLE SODIUM 40 MG PO TBEC
40.0000 mg | DELAYED_RELEASE_TABLET | Freq: Every day | ORAL | Status: DC
  Administered 2022-08-28 – 2022-09-09 (×13): 40 mg via ORAL
  Filled 2022-08-26 (×15): qty 1

## 2022-08-26 MED ORDER — ASPIRIN 81 MG PO TBEC
81.0000 mg | DELAYED_RELEASE_TABLET | Freq: Every day | ORAL | Status: DC
  Administered 2022-08-27 – 2022-09-06 (×10): 81 mg via ORAL
  Filled 2022-08-26 (×12): qty 1

## 2022-08-26 MED ORDER — TRAZODONE HCL 50 MG PO TABS
50.0000 mg | ORAL_TABLET | Freq: Every day | ORAL | Status: DC
  Administered 2022-08-26 – 2022-09-08 (×14): 50 mg via ORAL
  Filled 2022-08-26 (×14): qty 1

## 2022-08-26 MED ORDER — SODIUM CHLORIDE 0.9 % IV SOLN: 10.0000 mL/h | Freq: Once | INTRAVENOUS | Status: AC

## 2022-08-26 MED ORDER — TORSEMIDE 100 MG PO TABS
100.0000 mg | ORAL_TABLET | Freq: Every day | ORAL | Status: DC
  Administered 2022-08-26 – 2022-09-09 (×13): 100 mg via ORAL
  Filled 2022-08-26 (×3): qty 5
  Filled 2022-08-26: qty 1
  Filled 2022-08-26: qty 5
  Filled 2022-08-26: qty 1
  Filled 2022-08-26: qty 5
  Filled 2022-08-26: qty 1
  Filled 2022-08-26 (×6): qty 5

## 2022-08-26 MED ORDER — IPRATROPIUM-ALBUTEROL 20-100 MCG/ACT IN AERS
1.0000 | INHALATION_SPRAY | Freq: Four times a day (QID) | RESPIRATORY_TRACT | Status: DC
  Administered 2022-08-26 – 2022-09-09 (×47): 1 via RESPIRATORY_TRACT
  Filled 2022-08-26 (×2): qty 4

## 2022-08-26 MED ORDER — ALBUTEROL SULFATE (2.5 MG/3ML) 0.083% IN NEBU: 2.5000 mg | INHALATION_SOLUTION | RESPIRATORY_TRACT | Status: DC | PRN

## 2022-08-26 MED ORDER — PANTOPRAZOLE INFUSION (NEW) - SIMPLE MED
8.0000 mg/h | INTRAVENOUS | Status: DC
  Administered 2022-08-26 – 2022-08-27 (×4): 8 mg/h via INTRAVENOUS
  Filled 2022-08-26 (×3): qty 100

## 2022-08-26 MED ORDER — METOPROLOL SUCCINATE ER 50 MG PO TB24
100.0000 mg | ORAL_TABLET | Freq: Every day | ORAL | Status: DC
  Administered 2022-08-26 – 2022-09-09 (×14): 100 mg via ORAL
  Filled 2022-08-26 (×14): qty 2

## 2022-08-26 MED ORDER — LEVETIRACETAM 500 MG PO TABS
1000.0000 mg | ORAL_TABLET | Freq: Every day | ORAL | Status: DC
  Administered 2022-08-26 – 2022-09-09 (×15): 1000 mg via ORAL
  Filled 2022-08-26 (×15): qty 2

## 2022-08-26 MED ORDER — SERTRALINE HCL 50 MG PO TABS: 25.0000 mg | ORAL_TABLET | Freq: Every day | ORAL | Status: DC

## 2022-08-26 MED ORDER — ACETAMINOPHEN 325 MG PO TABS
650.0000 mg | ORAL_TABLET | Freq: Four times a day (QID) | ORAL | Status: DC | PRN
  Administered 2022-08-30 – 2022-09-09 (×3): 650 mg via ORAL
  Filled 2022-08-26 (×3): qty 2

## 2022-08-26 MED ORDER — CINACALCET HCL 30 MG PO TABS
30.0000 mg | ORAL_TABLET | Freq: Every day | ORAL | Status: DC
  Administered 2022-08-27 – 2022-09-09 (×12): 30 mg via ORAL
  Filled 2022-08-26 (×14): qty 1

## 2022-08-26 MED ORDER — LOSARTAN POTASSIUM 50 MG PO TABS: 100.0000 mg | ORAL_TABLET | Freq: Every day | ORAL | Status: DC

## 2022-08-26 MED ORDER — PANTOPRAZOLE SODIUM 40 MG IV SOLR: 40.0000 mg | INTRAVENOUS | Status: DC

## 2022-08-26 NOTE — ED Provider Notes (Signed)
[facility] Provider Note    Event Date/Time   First MD Initiated Contact with Patient 08/26/22 1726     (approximate)   History   Low blood level   HPI  Cynthia Dean is a 72 y.o. female patient presented to the emergency department today because of concerns for low hemoglobin found on blood work performed after dialysis today.  The patient states that she has history of low blood levels.  She has had transfusions in the past.  She has not noticed any new or worsening weakness or shortness of breath.  Family member states they have noticed some black stool recently.     Physical Exam   Triage Vital Signs: ED Triage Vitals  Enc Vitals Group     BP 08/26/22 1705 126/61     Pulse Rate 08/26/22 1705 65     Resp 08/26/22 1705 18     Temp 08/26/22 1705 98 F (36.7 C)     Temp Source 08/26/22 1705 Oral     SpO2 08/26/22 1708 95 %     Weight --      Height --      Head Circumference --      Peak Flow --      Pain Score 08/26/22 1705 0     Pain Loc --      Pain Edu? --      Excl. in [place]? --     Most recent vital signs: Vitals:   08/26/22 1705 08/26/22 1708  BP: 126/61   Pulse: 65   Resp: 18   Temp: 98 F (36.7 C)   SpO2:  95%   General: Awake, alert, oriented. CV:  Good peripheral perfusion. Regular rate and rhythm. Resp:  Normal effort. Lungs clear. Abd:  No distention.  Other:  Brown stool on glove. GUIAC positive.    ED Results / Procedures / Treatments   Labs (all labs ordered are listed, but only abnormal results are displayed) Labs Reviewed  COMPREHENSIVE METABOLIC PANEL - Abnormal; Notable for the following components:      Result Value   Glucose, Bld 149 (*)    BUN 27 (*)    Creatinine, Ser 5.48 (*)    Calcium 8.5 (*)    Albumin 3.1 (*)    AST 13 (*)    GFR, Estimated 8 (*)    All other components within normal limits  CBC - Abnormal; Notable for the following components:   RBC 2.44 (*)    Hemoglobin 6.2 (*)     HCT 20.8 (*)    MCH 25.4 (*)    MCHC 29.8 (*)    RDW 18.2 (*)    All other components within normal limits  TYPE AND SCREEN  PREPARE RBC (CROSSMATCH)     EKG  None   RADIOLOGY None   PROCEDURES:  Critical Care performed: Yes, see critical care procedure note(s)  Procedures  CRITICAL CARE Performed by: Nance Pear   Total critical care time: 30 minutes  Critical care time was exclusive of separately billable procedures and treating other patients.  Critical care was necessary to treat or prevent imminent or life-threatening deterioration.  Critical care was time spent personally by me on the following activities: development of treatment plan with patient and/or surrogate as well as nursing, discussions with consultants, evaluation of patient's response to treatment, examination of patient, obtaining history from patient or surrogate, ordering and performing treatments and interventions, ordering and review of laboratory  studies, ordering and review of radiographic studies, pulse oximetry and re-evaluation of patient's condition.   MEDICATIONS ORDERED IN ED: Medications - No data to display   IMPRESSION / MDM / [place] / ED COURSE  I reviewed the triage vital signs and the nursing notes.                              Differential diagnosis includes, but is not limited to, anemia of chronic disease, gi bleed, bone marrow disorder.  Patient's presentation is most consistent with acute presentation with potential threat to life or bodily function.  Patient presented to the emergency department today after blood work at dialysis showed her to be anemic.  On exam patient is neither hypotensive nor tachycardic.  Hemoglobin is low here at 6.2.  Rectal exam with brown stool on glove however guaiac was positive.  This time I do have concern for GI bleed.  Discussed and consented patient for blood transfusion.  Will start IV Protonix.  Discussed with Dr.  Damita Dunnings with the hospitalist service who will plan on admission.   FINAL CLINICAL IMPRESSION(S) / ED DIAGNOSES   Final diagnoses:  Anemia, unspecified type  Gastrointestinal hemorrhage, unspecified gastrointestinal hemorrhage type       Note:  This document was prepared using Dragon voice recognition software and may include unintentional dictation errors.    Nance Pear, MD 08/26/22 [phone]

## 2022-08-26 NOTE — Assessment & Plan Note (Addendum)
History of PE 03/20/2022 S/p partial mastectomy for left breast cancer 03/04/2022 Patient on chronic anticoagulation with Eliquis for PE developed following her partial mastectomy for breast cancer Hold Eliquis in the setting of suspected GI bleeding

## 2022-08-26 NOTE — Assessment & Plan Note (Signed)
Chronic respiratory failure Not currently exacerbated Continue home inhalers and baseline oxygen

## 2022-08-26 NOTE — Assessment & Plan Note (Addendum)
No complaints of chest pain Continue aspirin, metoprolol, losartan and atorvastatin

## 2022-08-26 NOTE — ED Notes (Signed)
Medications not given by prior primary RN. This RN administering prior to transferring pt to floor.

## 2022-08-26 NOTE — H&P (Signed)
History and Physical    [name] TIR:[id number] DOB: 11/18/1950 DOA: 08/26/2022 DOS: the patient was seen and examined on 08/26/2022 PCP: Lowella Bandy, MD  Patient coming from: Home  Chief Complaint: No chief complaint on file.   HPI: Cynthia Dean is a 73 y.o. female with medical history significant for ESRD on HD MWF, insulin requiring T2DM, COPD on home 4 L home O2, CAD, carotid stenosis, seizure disorder, HTN, , prior CVA, GERD, GI bleeding 2020 with unremarkable EGD and colonoscopy,, left breast ca s/p partial mastectomy and left axillary dissection on 6/76/1950 complicated by left breast abscess with coag negative staph bacteremia as well as small bilateral PE 03/20/2022, currently on Eliquis, who was sent from dialysis to the ED for evaluation of hemoglobin of 5.7, down from baseline of around 7.6-8.3.  Patient relative reported to ED provider that she occasionally has dark stool but no frank blood in the stool.  Patient has baseline fatigue but denies any recent worsening of fatigue and denies shortness of breath, palpitations or lightheadedness and feels for the most part at her baseline. ED course and data review: Vitals within normal limits.  Hemoglobin 6.2.  Other labs in keeping with ESRD. Rectal exam done in the ED showed brown stool that was guaiac positive.  Patient was started on IV Protonix infusion and started on transfusion of 1 unit PRBCs.  Hospitalist consulted for admission.   Review of Systems: {ROS_Text:26778}  Past Medical History:  Diagnosis Date   Acute on chronic respiratory failure with hypoxia ([place]) 07/30/2019   Acute respiratory failure with hypoxia ([place]) 12/31/2018   Carotid arterial disease ([place])    a. 02/2018 < bilat ICA stenosis.   COPD (chronic obstructive pulmonary disease) (HCC)    Coronary artery disease    a. 11/2015 MV: EF 57%, no ischemia/infarct.   Cryptogenic stroke ([place])    a. 10/2017 s/p implantable loop recorder (no  Afib to date).   Diabetes mellitus without complication ([place])    Diarrhea    ESRD on dialysis [facility])    GI bleed    a. 01/2019 EGD/Colonoscopy: Mild gastritis. Colitis.   Heart murmur    a. 12/2018 Echo: EF 55-60%, impaired relaxation. Mod dil LA. Mildly dil RA. Mild Ca2+ of AoV.   Hematemesis 06/27/2017   History of 2019 novel coronavirus disease (COVID-19) 07/30/2019   Hyperkalemia    Hyperlipidemia    Hypertension    Intractable nausea and vomiting 08/08/2015   Nausea & vomiting 05/30/2018   Nausea and vomiting 05/29/2018   Pneumonia due to COVID-19 virus    Past Surgical History:  Procedure Laterality Date   A/V FISTULAGRAM Left 12/20/2016   Procedure: A/V Fistulagram;  Surgeon: Katha Cabal, MD;  Location: [place] [facility];  Service: Cardiovascular;  Laterality: Left;   A/V SHUNT INTERVENTION N/A 12/20/2016   Procedure: A/V Shunt Intervention;  Surgeon: Katha Cabal, MD;  Location: [place] [facility] LAB;  Service: Cardiovascular;  Laterality: N/A;   A/V SHUNTOGRAM Left 09/11/2017   Procedure: A/V SHUNTOGRAM;  Surgeon: Algernon Huxley, MD;  Location: [place] [facility];  Service: Cardiovascular;  Laterality: Left;   A/V SHUNTOGRAM Left 11/21/2019   Procedure: A/V SHUNTOGRAM;  Surgeon: Algernon Huxley, MD;  Location: [place] [facility];  Service: Cardiovascular;  Laterality: Left;   BREAST BIOPSY Bilateral 07/19/2000   neg   BREAST BIOPSY Left 11/03/2021   [place] bx mass at 3:00, venus marker, axilla bx-hyrdo marker, path pending  COLONOSCOPY WITH PROPOFOL N/A 02/16/2019   Procedure: COLONOSCOPY WITH PROPOFOL;  Surgeon: [place], Benay Pike, MD;  Location: [facility];  Service: Gastroenterology;  Laterality: N/A;   ESOPHAGOGASTRODUODENOSCOPY (EGD) WITH PROPOFOL N/A 02/16/2019   Procedure: ESOPHAGOGASTRODUODENOSCOPY (EGD) WITH PROPOFOL;  Surgeon: [place], Benay Pike, MD;  Location: [facility];  Service: Gastroenterology;  Laterality: N/A;   IR US GUIDE BX ASP/DRAIN   03/25/2022   LOOP RECORDER INSERTION N/A 11/16/2017   Procedure: LOOP RECORDER INSERTION;  Surgeon: Deboraha Sprang, MD;  Location: [place] [facility];  Service: Cardiovascular;  Laterality: N/A;   PERIPHERAL VASCULAR CATHETERIZATION Left 02/02/2015   Procedure: A/V Shuntogram/Fistulagram;  Surgeon: Algernon Huxley, MD;  Location: [place] [facility];  Service: Cardiovascular;  Laterality: Left;   PERIPHERAL VASCULAR CATHETERIZATION Left 02/02/2015   Procedure: A/V Shunt Intervention;  Surgeon: Algernon Huxley, MD;  Location: [place] [facility];  Service: Cardiovascular;  Laterality: Left;   PERIPHERAL VASCULAR CATHETERIZATION Left 03/09/2015   Procedure: A/V Shuntogram/Fistulagram;  Surgeon: Algernon Huxley, MD;  Location: [place] [facility];  Service: Cardiovascular;  Laterality: Left;   PERIPHERAL VASCULAR CATHETERIZATION N/A 03/09/2015   Procedure: A/V Shunt Intervention;  Surgeon: Algernon Huxley, MD;  Location: [place] [facility];  Service: Cardiovascular;  Laterality: N/A;   PERIPHERAL VASCULAR CATHETERIZATION Left 04/17/2015   Procedure: Upper Extremity Angiography;  Surgeon: Algernon Huxley, MD;  Location: [place] [facility];  Service: Cardiovascular;  Laterality: Left;   PERIPHERAL VASCULAR CATHETERIZATION  04/17/2015   Procedure: Upper Extremity Intervention;  Surgeon: Algernon Huxley, MD;  Location: [place] [facility];  Service: Cardiovascular;;   PERIPHERAL VASCULAR CATHETERIZATION N/A 08/10/2015   Procedure: A/V Shuntogram/Fistulagram;  Surgeon: Algernon Huxley, MD;  Location: [place] CV LAB;  Service: Cardiovascular;  Laterality: N/A;   PERIPHERAL VASCULAR CATHETERIZATION N/A 08/10/2015   Procedure: A/V Shunt Intervention;  Surgeon: Algernon Huxley, MD;  Location: [place] [facility];  Service: Cardiovascular;  Laterality: N/A;   PERIPHERAL VASCULAR CATHETERIZATION Left 04/11/2016   Procedure: A/V Shuntogram/Fistulagram;  Surgeon: Algernon Huxley, MD;  Location: [place] [facility];  Service:  Cardiovascular;  Laterality: Left;   TEE WITHOUT CARDIOVERSION N/A 11/16/2017   Procedure: TRANSESOPHAGEAL ECHOCARDIOGRAM (TEE);  Surgeon: Minna Merritts, MD;  Location: [facility];  Service: Cardiovascular;  Laterality: N/A;   Social History:  reports that she has quit smoking. Her smoking use included cigarettes. She has never used smokeless tobacco. She reports that she does not drink alcohol and does not use drugs.  Allergies  Allergen Reactions   Oxycodone Nausea And Vomiting   Oxycodone-Acetaminophen Other (See Comments)   Wound Dressing Adhesive    Hydrocodone     Intolerant more than allergic   Tape Itching    Skin Dermatitis/itching (tape adhesive) Skin Dermatitis/itching (tape adhesive)   Tapentadol Itching    Skin Dermatitis/itching (tape adhesive) Skin Dermatitis/itching (tape adhesive)     Family History  Problem Relation Age of Onset   Breast cancer Father 85   Stroke Mother    Heart attack Mother    Heart Problems Sister    Breast cancer Cousin 14       1 st cousin. Maternal     Prior to Admission medications   Medication Sig Start Date End Date Taking? Authorizing Provider  acetaminophen (TYLENOL) 500 MG tablet Take 1 tablet (500 mg total) by mouth every 6 (six) hours as needed. 07/01/22   Naaman Plummer, MD  apixaban [place])  5 MG TABS tablet Take 1 tablet (5 mg total) by mouth 2 (two) times daily. 07/01/22 07/31/22  Naaman Plummer, MD  aspirin EC 81 MG tablet Take 1 tablet (81 mg total) by mouth daily. Swallow whole. 07/01/22   Naaman Plummer, MD  atorvastatin (LIPITOR) 80 MG tablet Take 1 tablet (80 mg total) by mouth every evening. 07/01/22 07/31/22  Naaman Plummer, MD  cinacalcet (SENSIPAR) 30 MG tablet Take 1 tablet (30 mg total) by mouth daily with breakfast. 07/01/22   Naaman Plummer, MD  diphenoxylate-atropine (LOMOTIL) 2.5-0.025 MG tablet SMARTSIG:1 Tablet(s) By Mouth Every 12 Hours PRN 07/01/22   Naaman Plummer, MD  fluticasone (FLONASE) 50 MCG/ACT  nasal spray Place 1 spray into both nostrils daily. 07/01/22 07/31/22  Naaman Plummer, MD  gabapentin (NEURONTIN) 300 MG capsule Take 1 capsule (300 mg total) by mouth 2 (two) times daily. 07/01/22 07/31/22  Naaman Plummer, MD  glucose 4 GM chewable tablet Chew 1 tablet (4 g total) by mouth as needed for low blood sugar. 07/01/22   Naaman Plummer, MD  insulin NPH-regular Human (NOVOLIN 70/30) (70-30) 100 UNIT/ML injection Inject 28 Units into the skin 2 (two) times daily with a meal. 07/01/22   Bradler, Vista Lawman, MD  Ipratropium-Albuterol (COMBIVENT) 20-100 MCG/ACT AERS respimat Inhale 1 puff into the lungs every 6 (six) hours. 07/01/22   Naaman Plummer, MD  levETIRAcetam (KEPPRA) 1000 MG tablet Take 1 tablet (1,000 mg total) by mouth daily. 07/01/22   Naaman Plummer, MD  levETIRAcetam (KEPPRA) 250 MG tablet Take 1 tablet (250 mg total) by mouth 3 (three) times a week. 07/01/22 07/31/22  Naaman Plummer, MD  lidocaine (LIDODERM) 5 % Place 1 patch onto the skin daily. Remove & Discard patch within 12 hours or as directed by MD 07/01/22   Naaman Plummer, MD  losartan (COZAAR) 100 MG tablet Take 1 tablet (100 mg total) by mouth at bedtime. 07/01/22 07/31/22  Naaman Plummer, MD  methylPREDNISolone (MEDROL) 4 MG tablet Take 1 tablet (4 mg total) by mouth daily. 07/01/22   Naaman Plummer, MD  metoprolol succinate (TOPROL-XL) 100 MG 24 hr tablet Take 1 tablet (100 mg total) by mouth daily. 07/01/22 07/31/22  Naaman Plummer, MD  omeprazole (PRILOSEC) 20 MG capsule Take 1 capsule (20 mg total) by mouth 2 (two) times daily. 07/01/22 07/31/22  Naaman Plummer, MD  sertraline (ZOLOFT) 25 MG tablet Take 1 tablet (25 mg total) by mouth daily. 07/01/22 07/31/22  Naaman Plummer, MD  torsemide (DEMADEX) 100 MG tablet Take 1 tablet (100 mg total) by mouth daily. 07/01/22 07/31/22  Naaman Plummer, MD  traZODone (DESYREL) 50 MG tablet Take 1 tablet (50 mg total) by mouth at bedtime. 07/01/22 07/31/22  Naaman Plummer, MD  vitamin D3  (CHOLECALCIFEROL) 25 MCG tablet Take 1 tablet (1,000 Units total) by mouth daily. 07/01/22   Naaman Plummer, MD    Physical Exam: Vitals:   08/26/22 1705 08/26/22 1708  BP: 126/61   Pulse: 65   Resp: 18   Temp: 98 F (36.7 C)   TempSrc: Oral   SpO2:  95%   Physical Exam  Labs on Admission: I have personally reviewed following labs and imaging studies  CBC: Recent Labs  Lab 08/26/22 0930 08/26/22 1758  WBC  --  8.0  HGB 5.7* 6.2*  HCT 19.0* 20.8*  MCV  --  85.2  PLT  --  220  Basic Metabolic Panel: Recent Labs  Lab 08/26/22 1758  NA 139  K 3.9  CL 103  CO2 27  GLUCOSE 149*  BUN 27*  CREATININE 5.48*  CALCIUM 8.5*   GFR: CrCl cannot be calculated (Unknown ideal weight.). Liver Function Tests: Recent Labs  Lab 08/26/22 1758  AST 13*  ALT 7  ALKPHOS 89  BILITOT 0.6  PROT 7.1  ALBUMIN 3.1*   No results for input(s): "LIPASE", "AMYLASE" in the last 168 hours. No results for input(s): "AMMONIA" in the last 168 hours. Coagulation Profile: No results for input(s): "INR", "PROTIME" in the last 168 hours. Cardiac Enzymes: No results for input(s): "CKTOTAL", "CKMB", "CKMBINDEX", "TROPONINI" in the last 168 hours. BNP (last 3 results) No results for input(s): "PROBNP" in the last 8760 hours. HbA1C: No results for input(s): "HGBA1C" in the last 72 hours. CBG: No results for input(s): "GLUCAP" in the last 168 hours. Lipid Profile: No results for input(s): "CHOL", "HDL", "LDLCALC", "TRIG", "CHOLHDL", "LDLDIRECT" in the last 72 hours. Thyroid Function Tests: No results for input(s): "TSH", "T4TOTAL", "FREET4", "T3FREE", "THYROIDAB" in the last 72 hours. Anemia Panel: No results for input(s): "VITAMINB12", "FOLATE", "FERRITIN", "TIBC", "IRON", "RETICCTPCT" in the last 72 hours. Urine analysis:    Component Value Date/Time   COLORURINE YELLOW (A) 03/20/2022 0125   APPEARANCEUR CLEAR (A) 03/20/2022 0125   APPEARANCEUR Clear 06/14/2014 1510   LABSPEC 1.010  03/20/2022 0125   LABSPEC 1.006 06/14/2014 1510   PHURINE 7.0 03/20/2022 0125   GLUCOSEU 50 (A) 03/20/2022 0125   GLUCOSEU >=500 06/14/2014 1510   HGBUR MODERATE (A) 03/20/2022 0125   BILIRUBINUR NEGATIVE 03/20/2022 0125   BILIRUBINUR Negative 06/14/2014 1510   KETONESUR NEGATIVE 03/20/2022 0125   PROTEINUR 100 (A) 03/20/2022 0125   NITRITE NEGATIVE 03/20/2022 0125   LEUKOCYTESUR NEGATIVE 03/20/2022 0125   LEUKOCYTESUR Negative 06/14/2014 1510    Radiological Exams on Admission: No results found.   Data Reviewed: Relevant notes from primary care and specialist visits, past discharge summaries as available in EHR, including Care Everywhere. 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 Reports of occasional dark stool stool and symptomatic for fatigue Being transfused 1 unit PRBCs started in the ED Continue to monitor H&H and transfuse as needed Holding Eliquis (taking for PE since August 2023) Continue Protonix infusion Will keep n.p.o. tonight in case of procedure Contacted GI, Dr. Virgina Jock for consult for consideration of endoscopy  Chronic anticoagulation History of PE 03/20/2022 S/p partial mastectomy for left breast cancer 03/04/2022 Patient on chronic anticoagulation with Eliquis for PE developed following her partial mastectomy for breast cancer Hold Eliquis in the setting of suspected GI bleeding  Chronic diastolic CHF  (congestive heart failure) (HCC) Clinically euvolemic Continue losartan, metoprolol and torsemide Monitor for fluid overload in view of transfusion of PRBC  ESRD on hemodialysis [facility]) Patient had dialysis on Friday 2/2 Nephrology consult for continuation of dialysis  CAD (coronary artery disease) No complaints of chest pain Continue aspirin, metoprolol, losartan and atorvastatin  Essential hypertension Continue  metoprolol and losartan  COPD (chronic obstructive pulmonary disease) (HCC) Chronic respiratory failure Not currently exacerbated Continue home inhalers and baseline oxygen  Depression Continue sertraline and trazodone  Seizure disorder ([place]) Continue Keppra  Insulin-requiring or dependent type II diabetes mellitus (HCC) Sliding scale insulin coverage  Aphasia as late effect of cerebrovascular accident (CVA) Continue aspirin and atorvastatin        DVT prophylaxis: SCD  Consults: GI, Dr. Virgina Jock and nephrology, Dr. Candiss Norse  Advance Care Planning:   Code Status: Prior   Family Communication: none***  Disposition Plan: Back to previous home environment  Severity of Illness: The appropriate patient status for this patient is OBSERVATION. Observation status is judged to be reasonable and necessary in order to provide the required intensity of service to ensure the patient's safety. The patient's presenting symptoms, physical exam findings, and initial radiographic and laboratory data in the context of their medical condition is felt to place them at decreased risk for further clinical deterioration. Furthermore, it is anticipated that the patient will be medically stable for discharge from the hospital within 2 midnights of admission.   Author: Athena Masse, MD 08/26/2022 7:54 PM  For on call review [email or web address].

## 2022-08-26 NOTE — ED Notes (Signed)
Multiple IV attempts made. RN to call phlebotomy and place IV team consult.

## 2022-08-26 NOTE — Assessment & Plan Note (Signed)
Patient had dialysis on Friday 2/2 Nephrology consult for continuation of dialysis

## 2022-08-26 NOTE — Assessment & Plan Note (Signed)
Clinically euvolemic Continue losartan, metoprolol and torsemide Monitor for fluid overload in view of transfusion of PRBC

## 2022-08-26 NOTE — Assessment & Plan Note (Signed)
Continue aspirin and atorvastatin. ?

## 2022-08-26 NOTE — Assessment & Plan Note (Signed)
Continue Keppra.

## 2022-08-26 NOTE — ED Triage Notes (Signed)
Patient to ED for abnormal labs- low hemoglobin (possibly 5.0 but family not sure.) Blood drawn at [facility] after dialysis today. Normal dialysis session today. Denies dizziness. Has had a blood transfusion previously.

## 2022-08-26 NOTE — Assessment & Plan Note (Signed)
Continue metoprolol and losartan 

## 2022-08-26 NOTE — Assessment & Plan Note (Signed)
Not followed by oncology.  Per PCP note "patient feels her treatment for breast cancer has led to too many complications and she is not interested in further treatment. No oncology follow up scheduled currently - her oncology team reached out for follow up but family declined" Patient's lumpectomy was complicated by abscess and staph bacteremia Patient says she does not think she wants to pursue any further treatment

## 2022-08-26 NOTE — Assessment & Plan Note (Signed)
Continue sertraline and trazodone

## 2022-08-26 NOTE — Assessment & Plan Note (Addendum)
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 Reports of occasional dark stool stool and symptomatic for fatigue Being transfused 1 unit PRBCs started in the ED Continue to monitor H&H and transfuse as needed Holding Eliquis (taking for PE since August 2023) Continue Protonix infusion Will keep n.p.o. tonight in case of procedure Contacted GI, Dr. Virgina Jock for consult for consideration of endoscopy

## 2022-08-26 NOTE — Assessment & Plan Note (Signed)
Sliding scale insulin coverage 

## 2022-08-27 DIAGNOSIS — D62 Acute posthemorrhagic anemia: Secondary | ICD-10-CM | POA: Diagnosis not present

## 2022-08-27 LAB — PREPARE RBC (CROSSMATCH)

## 2022-08-27 LAB — HEMOGLOBIN
Hemoglobin: 6.2 g/dL — ABNORMAL LOW (ref 12.0–15.0)
Hemoglobin: 7.4 g/dL — ABNORMAL LOW (ref 12.0–15.0)

## 2022-08-27 LAB — GLUCOSE, CAPILLARY: Glucose-Capillary: 114 mg/dL — ABNORMAL HIGH (ref 70–99)

## 2022-08-27 MED ORDER — SODIUM CHLORIDE 0.9% IV SOLUTION
Freq: Once | INTRAVENOUS | Status: AC
  Administered 2022-08-31: 10 mL/h via INTRAVENOUS

## 2022-08-27 MED ORDER — SODIUM CHLORIDE 0.9% IV SOLUTION: Freq: Once | INTRAVENOUS | Status: DC

## 2022-08-27 MED ORDER — PEG 3350-KCL-NA BICARB-NACL 420 G PO SOLR
4000.0000 mL | Freq: Once | ORAL | Status: DC
  Filled 2022-08-27: qty 4000

## 2022-08-27 MED ORDER — FUROSEMIDE 10 MG/ML IJ SOLN
20.0000 mg | Freq: Once | INTRAMUSCULAR | Status: AC
  Administered 2022-08-27: 20 mg via INTRAVENOUS
  Filled 2022-08-27: qty 2

## 2022-08-27 NOTE — Assessment & Plan Note (Signed)
Continue aspirin and atorvastatin. ?

## 2022-08-27 NOTE — Consult Note (Addendum)
GI Inpatient Consult Note  Reason for Consult: Symptomatic anemia, heme positive stool   Attending Requesting Consult: Dr. Fritzi Mandes, MD  History of Present Illness: Cynthia Dean is a 72 y.o. female seen for evaluation of symptomatic anemia, heme positive stool at the request of hospitalist - Dr. Fritzi Mandes. Patient has a PMH of HTN, HLD, obesity, ESRD on HD MWF, IDDM, COPD on chronic O2 supplementation, CAD, dCHF, seizure disorder, hx of CVA, GERD, hx of breast cancer, hx of bilateral PE 02/2022. She presented to the [facility] ED yesterday from her dialysis center for chief complaint of anemia with hemoglobin 5.7. Upon presentation to the ED, all vital signs were stable. Labs significant for hemoglobin 6.2, MCV 85, BUN 27, serum creatinine 5.48, and albumin 3.1. Per ED physician, she had brown stool on rectal exam but was guaiac positive. She was transfused 2 units pRBCs with improvement of hemoglobin to 7.4. GI consulted for further evaluation and management of anemia and heme positive stool.   Patient seen and examined this morning resting comfortably in hospital bed. No acute events overnight. There have been no overt signs of gastrointestinal bleeding. Patient denies any hematochezia or melena. She reports hx of baseline chronic constipation and uses Miralax to help her move her bowels. She reports she doesn't really look at the color of her stools. Per chart review, family has commented on black stools on and off for the past month. There have been no reports of frank hematochezia. Her last dose of Eliquis was Thursday per her report. Baseline hemoglobin has been ~8.0. She had EGD and colonoscopy performed July 2020 during hospital admission for GI bleed which showed no signs of bleeding.    Summary of GI Procedures:  CSY 02/16/2019 - normal appearing terminal ileum, erythematous mucosa in proximal ascending colon, otherwise normal examined colon  EGD 02/16/2019 - widely patent  Schatzki ring, normal mucosa found in entire esophagus, 1 cm hiatal hernia, gastritis, duodenitis   Past Medical History:  Past Medical History:  Diagnosis Date   Acute on chronic respiratory failure with hypoxia ([place]) 07/30/2019   Acute respiratory failure with hypoxia ([place]) 12/31/2018   Carotid arterial disease ([place])    a. 02/2018 < bilat ICA stenosis.   COPD (chronic obstructive pulmonary disease) (HCC)    Coronary artery disease    a. 11/2015 MV: EF 57%, no ischemia/infarct.   Cryptogenic stroke ([place])    a. 10/2017 s/p implantable loop recorder (no Afib to date).   Diabetes mellitus without complication ([place])    Diarrhea    ESRD on dialysis [facility])    GI bleed    a. 01/2019 EGD/Colonoscopy: Mild gastritis. Colitis.   Heart murmur    a. 12/2018 Echo: EF 55-60%, impaired relaxation. Mod dil LA. Mildly dil RA. Mild Ca2+ of AoV.   Hematemesis 06/27/2017   History of 2019 novel coronavirus disease (COVID-19) 07/30/2019   Hyperkalemia    Hyperlipidemia    Hypertension    Intractable nausea and vomiting 08/08/2015   Nausea & vomiting 05/30/2018   Nausea and vomiting 05/29/2018   Pneumonia due to COVID-19 virus     Problem List: Patient Active Problem List   Diagnosis Date Noted   Chronic respiratory failure with hypoxia ([place]) 08/26/2022   Acute on chronic blood loss anemia 08/26/2022   History of lower GI bleeding 08/26/2022   Insulin-requiring or dependent type II diabetes mellitus ([place]) 08/26/2022   Chronic anticoagulation 08/26/2022   History of pulmonary embolism 03/20/2022 08/26/2022  Breast cancer, left S/P partial mastectomy,03/04/2022 08/26/2022   Generalized weakness    Unable to ambulate 05/09/2022   ESRD on dialysis [facility])    Right leg pain    Depression    Pulmonary emboli (HCC)    Obesity with body mass index (BMI) of 30.0 to 39.9    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-stage renal disease) (HCC)    Chronic diastolic CHF (congestive heart failure) (HCC)    Seroma of breast    Bacteremia  due to coagulase-negative Staphylococcus    Left breast abscess    Urinary tract infection 03/22/2022   Anemia of chronic disease 03/22/2022   Acute pulmonary embolism without acute cor pulmonale ([place]) 03/20/2022   Dyslipidemia 03/20/2022   Diabetic neuropathy ([place]) 03/20/2022   Sepsis due to cellulitis ([place]) 03/20/2022   Delay kidney tx func d/t fluid overload requiring acute dialysis ([place]) 01/29/2020   Delay kidney tx func d/t ATN and fluid overload require acute dialysis ([place]) 01/29/2020   Hypoglycemia 10/21/2019   Unresponsiveness 10/21/2019   GERD (gastroesophageal reflux disease) 10/21/2019   Hypothermia    Sepsis with encephalopathy without septic shock (HCC)    Diarrhea    Acute metabolic encephalopathy    Acute on chronic diastolic CHF (congestive heart failure) ([place])    Acute on chronic respiratory failure with hypoxia ([place]) 07/30/2019   Rectal bleed 02/13/2019   Acute respiratory failure with hypoxia ([place]) 12/31/2018   History of CVA (cerebrovascular accident) 05/29/2018   Aphasia as late effect of cerebrovascular accident (CVA) 05/01/2018   Seizure disorder ([place]) 03/14/2018   Stroke ([place]) 03/07/2018   Slurred speech 11/14/2017   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[place]) 11/12/2017   CAD (coronary artery disease) 11/12/2017   COPD (chronic obstructive pulmonary disease) ([place]) 11/12/2017   ESRD on hemodialysis ([place]) 11/12/2017   CVA (cerebral vascular accident) ([place]) 04/14/2016   Essential hypertension 11/27/2015   Hyperlipidemia 11/27/2015   Prolonged Q-T interval on ECG 84/13/2440   Aphasia complicating stroke 05/21/2535   Cervical spine arthritis 07/27/2015   Diabetic retinopathy without macular edema associated with type 2 diabetes mellitus ([place]) 07/27/2015   Osteoarthritis of spine with radiculopathy, lumbar region 07/27/2015   Obesity, Class III, BMI 40-49.9 (morbid obesity) ([place]) 07/27/2015   Vitamin D deficiency 64/40/3474   Diastolic  heart failure ([place]) 10/27/2010    Past Surgical History: Past Surgical History:  Procedure Laterality Date   A/V FISTULAGRAM Left 12/20/2016   Procedure: A/V Fistulagram;  Surgeon: Katha Cabal, MD;  Location: [place] [facility];  Service: Cardiovascular;  Laterality: Left;   A/V SHUNT INTERVENTION N/A 12/20/2016   Procedure: A/V Shunt Intervention;  Surgeon: Katha Cabal, MD;  Location: [place] [facility];  Service: Cardiovascular;  Laterality: N/A;   A/V SHUNTOGRAM Left 09/11/2017   Procedure: A/V SHUNTOGRAM;  Surgeon: Algernon Huxley, MD;  Location: [place] [facility];  Service: Cardiovascular;  Laterality: Left;   A/V SHUNTOGRAM Left 11/21/2019   Procedure: A/V SHUNTOGRAM;  Surgeon: Algernon Huxley, MD;  Location: [place] [facility];  Service: Cardiovascular;  Laterality: Left;   BREAST BIOPSY Bilateral 07/19/2000   neg   BREAST BIOPSY Left 11/03/2021   [place] bx mass at 3:00, venus marker, axilla bx-hyrdo marker, path pending   COLONOSCOPY WITH PROPOFOL N/A 02/16/2019   Procedure: COLONOSCOPY WITH PROPOFOL;  Surgeon: Toledo, Benay Pike, MD;  Location: [facility];  Service: Gastroenterology;  Laterality: N/A;   ESOPHAGOGASTRODUODENOSCOPY (EGD) WITH PROPOFOL N/A 02/16/2019  Procedure: ESOPHAGOGASTRODUODENOSCOPY (EGD) WITH PROPOFOL;  Surgeon: Toledo, Benay Pike, MD;  Location: [facility];  Service: Gastroenterology;  Laterality: N/A;   IR US GUIDE BX ASP/DRAIN  03/25/2022   LOOP RECORDER INSERTION N/A 11/16/2017   Procedure: LOOP RECORDER INSERTION;  Surgeon: Deboraha Sprang, MD;  Location: [place] [facility];  Service: Cardiovascular;  Laterality: N/A;   PERIPHERAL VASCULAR CATHETERIZATION Left 02/02/2015   Procedure: A/V Shuntogram/Fistulagram;  Surgeon: Algernon Huxley, MD;  Location: [place] [facility];  Service: Cardiovascular;  Laterality: Left;   PERIPHERAL VASCULAR CATHETERIZATION Left 02/02/2015   Procedure: A/V Shunt Intervention;  Surgeon: Algernon Huxley, MD;   Location: [place] [facility];  Service: Cardiovascular;  Laterality: Left;   PERIPHERAL VASCULAR CATHETERIZATION Left 03/09/2015   Procedure: A/V Shuntogram/Fistulagram;  Surgeon: Algernon Huxley, MD;  Location: [place] [facility];  Service: Cardiovascular;  Laterality: Left;   PERIPHERAL VASCULAR CATHETERIZATION N/A 03/09/2015   Procedure: A/V Shunt Intervention;  Surgeon: Algernon Huxley, MD;  Location: [place] [facility];  Service: Cardiovascular;  Laterality: N/A;   PERIPHERAL VASCULAR CATHETERIZATION Left 04/17/2015   Procedure: Upper Extremity Angiography;  Surgeon: Algernon Huxley, MD;  Location: [place] [facility];  Service: Cardiovascular;  Laterality: Left;   PERIPHERAL VASCULAR CATHETERIZATION  04/17/2015   Procedure: Upper Extremity Intervention;  Surgeon: Algernon Huxley, MD;  Location: [place] [facility];  Service: Cardiovascular;;   PERIPHERAL VASCULAR CATHETERIZATION N/A 08/10/2015   Procedure: A/V Shuntogram/Fistulagram;  Surgeon: Algernon Huxley, MD;  Location: [place] [facility];  Service: Cardiovascular;  Laterality: N/A;   PERIPHERAL VASCULAR CATHETERIZATION N/A 08/10/2015   Procedure: A/V Shunt Intervention;  Surgeon: Algernon Huxley, MD;  Location: [place] [facility];  Service: Cardiovascular;  Laterality: N/A;   PERIPHERAL VASCULAR CATHETERIZATION Left 04/11/2016   Procedure: A/V Shuntogram/Fistulagram;  Surgeon: Algernon Huxley, MD;  Location: [place] [facility];  Service: Cardiovascular;  Laterality: Left;   TEE WITHOUT CARDIOVERSION N/A 11/16/2017   Procedure: TRANSESOPHAGEAL ECHOCARDIOGRAM (TEE);  Surgeon: Minna Merritts, MD;  Location: [facility];  Service: Cardiovascular;  Laterality: N/A;    Allergies: Allergies  Allergen Reactions   Oxycodone Nausea And Vomiting   Oxycodone-Acetaminophen Other (See Comments)   Wound Dressing Adhesive    Hydrocodone     Intolerant more than allergic   Tape Itching    Skin Dermatitis/itching (tape adhesive) Skin Dermatitis/itching (tape  adhesive)   Tapentadol Itching    Skin Dermatitis/itching (tape adhesive) Skin Dermatitis/itching (tape adhesive)     Home Medications: Medications Prior to Admission  Medication Sig Dispense Refill Last Dose   acetaminophen (TYLENOL) 500 MG tablet Take 1 tablet (500 mg total) by mouth every 6 (six) hours as needed. 30 tablet 1 unknown   apixaban (ELIQUIS) 5 MG TABS tablet Take 1 tablet (5 mg total) by mouth 2 (two) times daily. 60 tablet 0 08/25/2022 at 2000   aspirin EC 81 MG tablet Take 1 tablet (81 mg total) by mouth daily. Swallow whole. 30 tablet 1 08/25/2022   atorvastatin (LIPITOR) 80 MG tablet Take 1 tablet (80 mg total) by mouth every evening. 30 tablet 0 08/25/2022   cinacalcet (SENSIPAR) 30 MG tablet Take 1 tablet (30 mg total) by mouth daily with breakfast. 60 tablet 1 08/26/2022   diphenoxylate-atropine (LOMOTIL) 2.5-0.025 MG tablet SMARTSIG:1 Tablet(s) By Mouth Every 12 Hours PRN 30 tablet 1 unknown   fluticasone (FLONASE) 50 MCG/ACT nasal spray Place 1 spray into both nostrils daily. 1 g  0 unknown   gabapentin (NEURONTIN) 300 MG capsule Take 1 capsule (300 mg total) by mouth 2 (two) times daily. 60 capsule 0 08/25/2022   glucose 4 GM chewable tablet Chew 1 tablet (4 g total) by mouth as needed for low blood sugar. 50 tablet 1 unknown   insulin NPH-regular Human (NOVOLIN 70/30) (70-30) 100 UNIT/ML injection Inject 28 Units into the skin 2 (two) times daily with a meal. 10 mL 1 08/25/2022   Ipratropium-Albuterol (COMBIVENT) 20-100 MCG/ACT AERS respimat Inhale 1 puff into the lungs every 6 (six) hours. 4 g 0 08/25/2022   levETIRAcetam (KEPPRA) 1000 MG tablet Take 1 tablet (1,000 mg total) by mouth daily. 30 tablet 0 08/25/2022   levETIRAcetam (KEPPRA) 250 MG tablet Take 1 tablet (250 mg total) by mouth 3 (three) times a week. 12 tablet 0 08/26/2022   lidocaine (LIDODERM) 5 % Place 1 patch onto the skin daily. Remove & Discard patch within 12 hours or as directed by MD 30 patch 0 unknown    metoprolol succinate (TOPROL-XL) 100 MG 24 hr tablet Take 1 tablet (100 mg total) by mouth daily. 30 tablet 0 08/25/2022   omeprazole (PRILOSEC) 20 MG capsule Take 1 capsule (20 mg total) by mouth 2 (two) times daily. 60 capsule 0 08/25/2022   torsemide (DEMADEX) 100 MG tablet Take 1 tablet (100 mg total) by mouth daily. 30 tablet 0 08/25/2022   traZODone (DESYREL) 50 MG tablet Take 1 tablet (50 mg total) by mouth at bedtime. 30 tablet 0 08/25/2022   vitamin D3 (CHOLECALCIFEROL) 25 MCG tablet Take 1 tablet (1,000 Units total) by mouth daily. 60 tablet 1 08/25/2022   losartan (COZAAR) 100 MG tablet Take 1 tablet (100 mg total) by mouth at bedtime. 30 tablet 0    methylPREDNISolone (MEDROL) 4 MG tablet Take 1 tablet (4 mg total) by mouth daily. (Patient not taking: Reported on 08/26/2022) 1 tablet 0 Not Taking   sertraline (ZOLOFT) 25 MG tablet Take 1 tablet (25 mg total) by mouth daily. 30 tablet 0    Home medication reconciliation was completed with the patient.   Scheduled Inpatient Medications:    sodium chloride   Intravenous Once   sodium chloride   Intravenous Once   aspirin EC  81 mg Oral Daily   atorvastatin  80 mg Oral QPM   cinacalcet  30 mg Oral Q breakfast   gabapentin  300 mg Oral BID   insulin aspart  0-15 Units Subcutaneous Q4H   Ipratropium-Albuterol  1 puff Inhalation Q6H   levETIRAcetam  1,000 mg Oral Daily   [START ON 08/29/2022] levETIRAcetam  250 mg Oral Once per day on Mon Wed Fri   metoprolol succinate  100 mg Oral Daily   pantoprazole  40 mg Oral Daily   [START ON 08/30/2022] pantoprazole  40 mg Intravenous Q12H   torsemide  100 mg Oral Daily   traZODone  50 mg Oral QHS    Continuous Inpatient Infusions:    sodium chloride     pantoprazole 8 mg/hr (08/27/22 0504)    PRN Inpatient Medications:  acetaminophen **OR** acetaminophen, albuterol, HYDROcodone-acetaminophen  Family History: family history includes Breast cancer (age of onset: 23) in her cousin; Breast cancer  (age of onset: 46) in her father; Heart Problems in her sister; Heart attack in her mother; Stroke in her mother.  The patient's family history is negative for inflammatory bowel disorders, GI malignancy, or solid organ transplantation.  Social History:   reports that she has quit  smoking. Her smoking use included cigarettes. She has never used smokeless tobacco. She reports that she does not drink alcohol and does not use drugs. The patient denies ETOH, tobacco, or drug use.   Review of Systems: Constitutional: Weight is stable.  Eyes: No changes in vision. ENT: No oral lesions, sore throat.  GI: see HPI.  Heme/Lymph: No easy bruising.  CV: No chest pain.  GU: No hematuria.  Integumentary: No rashes.  Neuro: No headaches.  Psych: No depression/anxiety.  Endocrine: No heat/cold intolerance.  Allergic/Immunologic: No urticaria.  Resp: No cough, SOB.  Musculoskeletal: No joint swelling.    Physical Examination: BP (!) 156/64 (BP Location: Right Arm)   Pulse 63   Temp 98.6 F (37 C)   Resp 18   Ht '5\' 9"'$  (1.753 m)   Wt 104.1 kg   SpO2 93%   BMI 33.89 kg/m  Gen: NAD, alert and oriented x 4 HEENT: PEERLA, EOMI, Neck: supple, no JVD or thyromegaly Chest: CTA bilaterally, no wheezes, crackles, or other adventitious sounds CV: RRR, no m/g/c/r Abd: soft, NT, ND, +BS in all four quadrants; no HSM, guarding, ridigity, or rebound tenderness Ext: no edema, well perfused with 2+ pulses, Skin: no rash or lesions noted on areas not covered by clothing/blankets   Data: Lab Results  Component Value Date   WBC 8.0 08/26/2022   HGB 7.4 (L) 08/27/2022   HCT 20.8 (L) 08/26/2022   MCV 85.2 08/26/2022   PLT 220 08/26/2022   Recent Labs  Lab 08/26/22 1758 08/27/22 0031 08/27/22 0804  HGB 6.2* 6.2* 7.4*   Lab Results  Component Value Date   NA 139 08/26/2022   K 3.9 08/26/2022   CL 103 08/26/2022   CO2 27 08/26/2022   BUN 27 (H) 08/26/2022   CREATININE 5.48 (H) 08/26/2022    Lab Results  Component Value Date   ALT 7 08/26/2022   AST 13 (L) 08/26/2022   ALKPHOS 89 08/26/2022   BILITOT 0.6 08/26/2022   No results for input(s): "APTT", "INR", "PTT" in the last 168 hours.  Assessment/Plan:  72 y/o AA female with a PMH of HTN, HLD, obesity, ESRD on HD MWF, IDDM, COPD on chronic O2 supplementation, CAD, dCHF, seizure disorder, hx of CVA, GERD, hx of breast cancer, hx of bilateral PE 02/2022 presented to the [facility] ED from dialysis center for chief complaint of anemia  Anemia/Black stool - potential chronic GI blood loss versus anemia of chronic disease, DDx includes erosive esophagitis, PUD, gastritis +/- H pylori, duodenitis, polyp, neoplasm, AVM, etc  Heme positive stool  Anemia of chronic disease    ESRD on HD - MWF schedule  Hx of bilateral PE on chronic anticoagulation 02/2022  Chronic dCHF  Hx of CVA  COPD  Seizure disorder  IDDM  Recommendations:  - Maintain 2 large bore IVs for access - Continue to monitor serial H&H. Transfuse for Hgb <7.0.  - No signs of overt gastrointestinal bleeding - Continue to monitor for signs of GI bleeding - Continue Protonix for gastric protection - Advise EGD tomorrow for endoluminal evaluation. Defer colonoscopy for now, but consider if EGD is negative.  - Continue management or comorbidities per primary team - Appreciate nephrology assistance to continue hemodialysis  - Hold Eliquis (tonight will be 48 hours off Eliquis) - GI following along with you   I reviewed the risks (including bleeding, perforation, infection, anesthesia complications, cardiac/respiratory complications), benefits and alternatives of EGD. Patient consents to proceed.    Thank you  for the consult. Please call with questions or concerns.  Geanie Kenning, PA-C [facility] [phone]

## 2022-08-27 NOTE — Progress Notes (Signed)
       CROSS COVER NOTE  NAME: Cynthia Dean MRN: [id number] DOB : Dec 08, 1950    HPI/Events of Note   Hb 6.2 after first unit, unchanged from prior to transfusion  Assessment and  Interventions   Assessment:  Plan: Transfuse 2nd unit PRBC, Give lasix '20mg'$  IV prior to second unit Monitor for fluid overload in view of dialysis status Patient might need dialysis ahead of MWF schedule given transfusions

## 2022-08-27 NOTE — H&P (View-Only) (Signed)
GI Inpatient Consult Note  Reason for Consult: Symptomatic anemia, heme positive stool   Attending Requesting Consult: Dr. Fritzi Mandes, MD  History of Present Illness: Cynthia Dean is a 72 y.o. female seen for evaluation of symptomatic anemia, heme positive stool at the request of hospitalist - Dr. Fritzi Mandes. Patient has a PMH of HTN, HLD, obesity, ESRD on HD MWF, IDDM, COPD on chronic O2 supplementation, CAD, dCHF, seizure disorder, hx of CVA, GERD, hx of breast cancer, hx of bilateral PE 02/2022. She presented to the [facility] ED yesterday from her dialysis center for chief complaint of anemia with hemoglobin 5.7. Upon presentation to the ED, all vital signs were stable. Labs significant for hemoglobin 6.2, MCV 85, BUN 27, serum creatinine 5.48, and albumin 3.1. Per ED physician, she had brown stool on rectal exam but was guaiac positive. She was transfused 2 units pRBCs with improvement of hemoglobin to 7.4. GI consulted for further evaluation and management of anemia and heme positive stool.   Patient seen and examined this morning resting comfortably in hospital bed. No acute events overnight. There have been no overt signs of gastrointestinal bleeding. Patient denies any hematochezia or melena. She reports hx of baseline chronic constipation and uses Miralax to help her move her bowels. She reports she doesn't really look at the color of her stools. Per chart review, family has commented on black stools on and off for the past month. There have been no reports of frank hematochezia. Her last dose of Eliquis was Thursday per her report. Baseline hemoglobin has been ~8.0. She had EGD and colonoscopy performed July 2020 during hospital admission for GI bleed which showed no signs of bleeding.    Summary of GI Procedures:  CSY 02/16/2019 - normal appearing terminal ileum, erythematous mucosa in proximal ascending colon, otherwise normal examined colon  EGD 02/16/2019 - widely patent  Schatzki ring, normal mucosa found in entire esophagus, 1 cm hiatal hernia, gastritis, duodenitis   Past Medical History:  Past Medical History:  Diagnosis Date   Acute on chronic respiratory failure with hypoxia ([place]) 07/30/2019   Acute respiratory failure with hypoxia ([place]) 12/31/2018   Carotid arterial disease ([place])    a. 02/2018 < bilat ICA stenosis.   COPD (chronic obstructive pulmonary disease) (HCC)    Coronary artery disease    a. 11/2015 MV: EF 57%, no ischemia/infarct.   Cryptogenic stroke ([place])    a. 10/2017 s/p implantable loop recorder (no Afib to date).   Diabetes mellitus without complication ([place])    Diarrhea    ESRD on dialysis [facility])    GI bleed    a. 01/2019 EGD/Colonoscopy: Mild gastritis. Colitis.   Heart murmur    a. 12/2018 Echo: EF 55-60%, impaired relaxation. Mod dil LA. Mildly dil RA. Mild Ca2+ of AoV.   Hematemesis 06/27/2017   History of 2019 novel coronavirus disease (COVID-19) 07/30/2019   Hyperkalemia    Hyperlipidemia    Hypertension    Intractable nausea and vomiting 08/08/2015   Nausea & vomiting 05/30/2018   Nausea and vomiting 05/29/2018   Pneumonia due to COVID-19 virus     Problem List: Patient Active Problem List   Diagnosis Date Noted   Chronic respiratory failure with hypoxia ([place]) 08/26/2022   Acute on chronic blood loss anemia 08/26/2022   History of lower GI bleeding 08/26/2022   Insulin-requiring or dependent type II diabetes mellitus ([place]) 08/26/2022   Chronic anticoagulation 08/26/2022   History of pulmonary embolism 03/20/2022 08/26/2022  Breast cancer, left S/P partial mastectomy,03/04/2022 08/26/2022   Generalized weakness    Unable to ambulate 05/09/2022   ESRD on dialysis [facility])    Right leg pain    Depression    Pulmonary emboli (HCC)    Obesity with body mass index (BMI) of 30.0 to 39.9    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-stage renal disease) (HCC)    Chronic diastolic CHF (congestive heart failure) (HCC)    Seroma of breast    Bacteremia  due to coagulase-negative Staphylococcus    Left breast abscess    Urinary tract infection 03/22/2022   Anemia of chronic disease 03/22/2022   Acute pulmonary embolism without acute cor pulmonale ([place]) 03/20/2022   Dyslipidemia 03/20/2022   Diabetic neuropathy ([place]) 03/20/2022   Sepsis due to cellulitis ([place]) 03/20/2022   Delay kidney tx func d/t fluid overload requiring acute dialysis ([place]) 01/29/2020   Delay kidney tx func d/t ATN and fluid overload require acute dialysis ([place]) 01/29/2020   Hypoglycemia 10/21/2019   Unresponsiveness 10/21/2019   GERD (gastroesophageal reflux disease) 10/21/2019   Hypothermia    Sepsis with encephalopathy without septic shock (HCC)    Diarrhea    Acute metabolic encephalopathy    Acute on chronic diastolic CHF (congestive heart failure) ([place])    Acute on chronic respiratory failure with hypoxia ([place]) 07/30/2019   Rectal bleed 02/13/2019   Acute respiratory failure with hypoxia ([place]) 12/31/2018   History of CVA (cerebrovascular accident) 05/29/2018   Aphasia as late effect of cerebrovascular accident (CVA) 05/01/2018   Seizure disorder ([place]) 03/14/2018   Stroke ([place]) 03/07/2018   Slurred speech 11/14/2017   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[place]) 11/12/2017   CAD (coronary artery disease) 11/12/2017   COPD (chronic obstructive pulmonary disease) ([place]) 11/12/2017   ESRD on hemodialysis ([place]) 11/12/2017   CVA (cerebral vascular accident) ([place]) 04/14/2016   Essential hypertension 11/27/2015   Hyperlipidemia 11/27/2015   Prolonged Q-T interval on ECG 35/46/5681   Aphasia complicating stroke 27/51/7001   Cervical spine arthritis 07/27/2015   Diabetic retinopathy without macular edema associated with type 2 diabetes mellitus ([place]) 07/27/2015   Osteoarthritis of spine with radiculopathy, lumbar region 07/27/2015   Obesity, Class III, BMI 40-49.9 (morbid obesity) ([place]) 07/27/2015   Vitamin D deficiency 74/94/4967   Diastolic  heart failure ([place]) 10/27/2010    Past Surgical History: Past Surgical History:  Procedure Laterality Date   A/V FISTULAGRAM Left 12/20/2016   Procedure: A/V Fistulagram;  Surgeon: Katha Cabal, MD;  Location: [place] [facility];  Service: Cardiovascular;  Laterality: Left;   A/V SHUNT INTERVENTION N/A 12/20/2016   Procedure: A/V Shunt Intervention;  Surgeon: Katha Cabal, MD;  Location: [place] [facility];  Service: Cardiovascular;  Laterality: N/A;   A/V SHUNTOGRAM Left 09/11/2017   Procedure: A/V SHUNTOGRAM;  Surgeon: Algernon Huxley, MD;  Location: [place] [facility];  Service: Cardiovascular;  Laterality: Left;   A/V SHUNTOGRAM Left 11/21/2019   Procedure: A/V SHUNTOGRAM;  Surgeon: Algernon Huxley, MD;  Location: [place] [facility];  Service: Cardiovascular;  Laterality: Left;   BREAST BIOPSY Bilateral 07/19/2000   neg   BREAST BIOPSY Left 11/03/2021   [place] bx mass at 3:00, venus marker, axilla bx-hyrdo marker, path pending   COLONOSCOPY WITH PROPOFOL N/A 02/16/2019   Procedure: COLONOSCOPY WITH PROPOFOL;  Surgeon: Toledo, Benay Pike, MD;  Location: [facility];  Service: Gastroenterology;  Laterality: N/A;   ESOPHAGOGASTRODUODENOSCOPY (EGD) WITH PROPOFOL N/A 02/16/2019  Procedure: ESOPHAGOGASTRODUODENOSCOPY (EGD) WITH PROPOFOL;  Surgeon: Toledo, Benay Pike, MD;  Location: [facility];  Service: Gastroenterology;  Laterality: N/A;   IR US GUIDE BX ASP/DRAIN  03/25/2022   LOOP RECORDER INSERTION N/A 11/16/2017   Procedure: LOOP RECORDER INSERTION;  Surgeon: Deboraha Sprang, MD;  Location: [place] [facility];  Service: Cardiovascular;  Laterality: N/A;   PERIPHERAL VASCULAR CATHETERIZATION Left 02/02/2015   Procedure: A/V Shuntogram/Fistulagram;  Surgeon: Algernon Huxley, MD;  Location: [place] [facility];  Service: Cardiovascular;  Laterality: Left;   PERIPHERAL VASCULAR CATHETERIZATION Left 02/02/2015   Procedure: A/V Shunt Intervention;  Surgeon: Algernon Huxley, MD;   Location: [place] [facility];  Service: Cardiovascular;  Laterality: Left;   PERIPHERAL VASCULAR CATHETERIZATION Left 03/09/2015   Procedure: A/V Shuntogram/Fistulagram;  Surgeon: Algernon Huxley, MD;  Location: [place] [facility];  Service: Cardiovascular;  Laterality: Left;   PERIPHERAL VASCULAR CATHETERIZATION N/A 03/09/2015   Procedure: A/V Shunt Intervention;  Surgeon: Algernon Huxley, MD;  Location: [place] [facility];  Service: Cardiovascular;  Laterality: N/A;   PERIPHERAL VASCULAR CATHETERIZATION Left 04/17/2015   Procedure: Upper Extremity Angiography;  Surgeon: Algernon Huxley, MD;  Location: [place] [facility];  Service: Cardiovascular;  Laterality: Left;   PERIPHERAL VASCULAR CATHETERIZATION  04/17/2015   Procedure: Upper Extremity Intervention;  Surgeon: Algernon Huxley, MD;  Location: [place] [facility];  Service: Cardiovascular;;   PERIPHERAL VASCULAR CATHETERIZATION N/A 08/10/2015   Procedure: A/V Shuntogram/Fistulagram;  Surgeon: Algernon Huxley, MD;  Location: [place] [facility];  Service: Cardiovascular;  Laterality: N/A;   PERIPHERAL VASCULAR CATHETERIZATION N/A 08/10/2015   Procedure: A/V Shunt Intervention;  Surgeon: Algernon Huxley, MD;  Location: [place] [facility];  Service: Cardiovascular;  Laterality: N/A;   PERIPHERAL VASCULAR CATHETERIZATION Left 04/11/2016   Procedure: A/V Shuntogram/Fistulagram;  Surgeon: Algernon Huxley, MD;  Location: [place] [facility];  Service: Cardiovascular;  Laterality: Left;   TEE WITHOUT CARDIOVERSION N/A 11/16/2017   Procedure: TRANSESOPHAGEAL ECHOCARDIOGRAM (TEE);  Surgeon: Minna Merritts, MD;  Location: [facility];  Service: Cardiovascular;  Laterality: N/A;    Allergies: Allergies  Allergen Reactions   Oxycodone Nausea And Vomiting   Oxycodone-Acetaminophen Other (See Comments)   Wound Dressing Adhesive    Hydrocodone     Intolerant more than allergic   Tape Itching    Skin Dermatitis/itching (tape adhesive) Skin Dermatitis/itching (tape  adhesive)   Tapentadol Itching    Skin Dermatitis/itching (tape adhesive) Skin Dermatitis/itching (tape adhesive)     Home Medications: Medications Prior to Admission  Medication Sig Dispense Refill Last Dose   acetaminophen (TYLENOL) 500 MG tablet Take 1 tablet (500 mg total) by mouth every 6 (six) hours as needed. 30 tablet 1 unknown   apixaban (ELIQUIS) 5 MG TABS tablet Take 1 tablet (5 mg total) by mouth 2 (two) times daily. 60 tablet 0 08/25/2022 at 2000   aspirin EC 81 MG tablet Take 1 tablet (81 mg total) by mouth daily. Swallow whole. 30 tablet 1 08/25/2022   atorvastatin (LIPITOR) 80 MG tablet Take 1 tablet (80 mg total) by mouth every evening. 30 tablet 0 08/25/2022   cinacalcet (SENSIPAR) 30 MG tablet Take 1 tablet (30 mg total) by mouth daily with breakfast. 60 tablet 1 08/26/2022   diphenoxylate-atropine (LOMOTIL) 2.5-0.025 MG tablet SMARTSIG:1 Tablet(s) By Mouth Every 12 Hours PRN 30 tablet 1 unknown   fluticasone (FLONASE) 50 MCG/ACT nasal spray Place 1 spray into both nostrils daily. 1 g  0 unknown   gabapentin (NEURONTIN) 300 MG capsule Take 1 capsule (300 mg total) by mouth 2 (two) times daily. 60 capsule 0 08/25/2022   glucose 4 GM chewable tablet Chew 1 tablet (4 g total) by mouth as needed for low blood sugar. 50 tablet 1 unknown   insulin NPH-regular Human (NOVOLIN 70/30) (70-30) 100 UNIT/ML injection Inject 28 Units into the skin 2 (two) times daily with a meal. 10 mL 1 08/25/2022   Ipratropium-Albuterol (COMBIVENT) 20-100 MCG/ACT AERS respimat Inhale 1 puff into the lungs every 6 (six) hours. 4 g 0 08/25/2022   levETIRAcetam (KEPPRA) 1000 MG tablet Take 1 tablet (1,000 mg total) by mouth daily. 30 tablet 0 08/25/2022   levETIRAcetam (KEPPRA) 250 MG tablet Take 1 tablet (250 mg total) by mouth 3 (three) times a week. 12 tablet 0 08/26/2022   lidocaine (LIDODERM) 5 % Place 1 patch onto the skin daily. Remove & Discard patch within 12 hours or as directed by MD 30 patch 0 unknown    metoprolol succinate (TOPROL-XL) 100 MG 24 hr tablet Take 1 tablet (100 mg total) by mouth daily. 30 tablet 0 08/25/2022   omeprazole (PRILOSEC) 20 MG capsule Take 1 capsule (20 mg total) by mouth 2 (two) times daily. 60 capsule 0 08/25/2022   torsemide (DEMADEX) 100 MG tablet Take 1 tablet (100 mg total) by mouth daily. 30 tablet 0 08/25/2022   traZODone (DESYREL) 50 MG tablet Take 1 tablet (50 mg total) by mouth at bedtime. 30 tablet 0 08/25/2022   vitamin D3 (CHOLECALCIFEROL) 25 MCG tablet Take 1 tablet (1,000 Units total) by mouth daily. 60 tablet 1 08/25/2022   losartan (COZAAR) 100 MG tablet Take 1 tablet (100 mg total) by mouth at bedtime. 30 tablet 0    methylPREDNISolone (MEDROL) 4 MG tablet Take 1 tablet (4 mg total) by mouth daily. (Patient not taking: Reported on 08/26/2022) 1 tablet 0 Not Taking   sertraline (ZOLOFT) 25 MG tablet Take 1 tablet (25 mg total) by mouth daily. 30 tablet 0    Home medication reconciliation was completed with the patient.   Scheduled Inpatient Medications:    sodium chloride   Intravenous Once   sodium chloride   Intravenous Once   aspirin EC  81 mg Oral Daily   atorvastatin  80 mg Oral QPM   cinacalcet  30 mg Oral Q breakfast   gabapentin  300 mg Oral BID   insulin aspart  0-15 Units Subcutaneous Q4H   Ipratropium-Albuterol  1 puff Inhalation Q6H   levETIRAcetam  1,000 mg Oral Daily   [START ON 08/29/2022] levETIRAcetam  250 mg Oral Once per day on Mon Wed Fri   metoprolol succinate  100 mg Oral Daily   pantoprazole  40 mg Oral Daily   [START ON 08/30/2022] pantoprazole  40 mg Intravenous Q12H   torsemide  100 mg Oral Daily   traZODone  50 mg Oral QHS    Continuous Inpatient Infusions:    sodium chloride     pantoprazole 8 mg/hr (08/27/22 0504)    PRN Inpatient Medications:  acetaminophen **OR** acetaminophen, albuterol, HYDROcodone-acetaminophen  Family History: family history includes Breast cancer (age of onset: 73) in her cousin; Breast cancer  (age of onset: 52) in her father; Heart Problems in her sister; Heart attack in her mother; Stroke in her mother.  The patient's family history is negative for inflammatory bowel disorders, GI malignancy, or solid organ transplantation.  Social History:   reports that she has quit  smoking. Her smoking use included cigarettes. She has never used smokeless tobacco. She reports that she does not drink alcohol and does not use drugs. The patient denies ETOH, tobacco, or drug use.   Review of Systems: Constitutional: Weight is stable.  Eyes: No changes in vision. ENT: No oral lesions, sore throat.  GI: see HPI.  Heme/Lymph: No easy bruising.  CV: No chest pain.  GU: No hematuria.  Integumentary: No rashes.  Neuro: No headaches.  Psych: No depression/anxiety.  Endocrine: No heat/cold intolerance.  Allergic/Immunologic: No urticaria.  Resp: No cough, SOB.  Musculoskeletal: No joint swelling.    Physical Examination: BP (!) 156/64 (BP Location: Right Arm)   Pulse 63   Temp 98.6 F (37 C)   Resp 18   Ht '5\' 9"'$  (1.753 m)   Wt 104.1 kg   SpO2 93%   BMI 33.89 kg/m  Gen: NAD, alert and oriented x 4 HEENT: PEERLA, EOMI, Neck: supple, no JVD or thyromegaly Chest: CTA bilaterally, no wheezes, crackles, or other adventitious sounds CV: RRR, no m/g/c/r Abd: soft, NT, ND, +BS in all four quadrants; no HSM, guarding, ridigity, or rebound tenderness Ext: no edema, well perfused with 2+ pulses, Skin: no rash or lesions noted on areas not covered by clothing/blankets   Data: Lab Results  Component Value Date   WBC 8.0 08/26/2022   HGB 7.4 (L) 08/27/2022   HCT 20.8 (L) 08/26/2022   MCV 85.2 08/26/2022   PLT 220 08/26/2022   Recent Labs  Lab 08/26/22 1758 08/27/22 0031 08/27/22 0804  HGB 6.2* 6.2* 7.4*   Lab Results  Component Value Date   NA 139 08/26/2022   K 3.9 08/26/2022   CL 103 08/26/2022   CO2 27 08/26/2022   BUN 27 (H) 08/26/2022   CREATININE 5.48 (H) 08/26/2022    Lab Results  Component Value Date   ALT 7 08/26/2022   AST 13 (L) 08/26/2022   ALKPHOS 89 08/26/2022   BILITOT 0.6 08/26/2022   No results for input(s): "APTT", "INR", "PTT" in the last 168 hours.  Assessment/Plan:  72 y/o AA female with a PMH of HTN, HLD, obesity, ESRD on HD MWF, IDDM, COPD on chronic O2 supplementation, CAD, dCHF, seizure disorder, hx of CVA, GERD, hx of breast cancer, hx of bilateral PE 02/2022 presented to the [facility] ED from dialysis center for chief complaint of anemia  Anemia/Black stool - potential chronic GI blood loss versus anemia of chronic disease, DDx includes erosive esophagitis, PUD, gastritis +/- H pylori, duodenitis, polyp, neoplasm, AVM, etc  Heme positive stool  Anemia of chronic disease    ESRD on HD - MWF schedule  Hx of bilateral PE on chronic anticoagulation 02/2022  Chronic dCHF  Hx of CVA  COPD  Seizure disorder  IDDM  Recommendations:  - Maintain 2 large bore IVs for access - Continue to monitor serial H&H. Transfuse for Hgb <7.0.  - No signs of overt gastrointestinal bleeding - Continue to monitor for signs of GI bleeding - Continue Protonix for gastric protection - Advise EGD tomorrow for endoluminal evaluation. Defer colonoscopy for now, but consider if EGD is negative.  - Continue management or comorbidities per primary team - Appreciate nephrology assistance to continue hemodialysis  - Hold Eliquis (tonight will be 48 hours off Eliquis) - GI following along with you   I reviewed the risks (including bleeding, perforation, infection, anesthesia complications, cardiac/respiratory complications), benefits and alternatives of EGD. Patient consents to proceed.    Thank you  for the consult. Please call with questions or concerns.  Geanie Kenning, PA-C [facility] [phone]

## 2022-08-27 NOTE — Progress Notes (Signed)
Cynthia Dean at [place] NAME: Cynthia Dean    MR#:  [id number]  [place]:  Apr 18, 1951  SUBJECTIVE:  No family earlier at bedside when seen by me patient came in since she was found to have drop in hemoglobin to 5.7. Patient denied any black stools however was reported by patient's boyfriend to G.I. PA that patient had black colored stools. She is on PO eliquis for pulmonary embolism. He hemodialysis Monday Wednesday Friday.    VITALS:  Blood pressure (!) 156/64, pulse 63, temperature 98.6 F (37 C), resp. rate 18, height '5\' 9"'$  (1.753 m), weight 104.1 kg, SpO2 93 %.  PHYSICAL EXAMINATION:   GENERAL:  72 y.o.-year-old patient with no acute distress. Morbid Obesity, Pallor+ LUNGS: Normal breath sounds bilaterally, no wheezing CARDIOVASCULAR: S1, S2 normal. No murmur   ABDOMEN: Soft, nontender, nondistended. Bowel sounds present.  EXTREMITIES: No  edema b/l.   HD Access+ NEUROLOGIC: nonfocal  patient is alert and awake SKIN: per RN  LABORATORY PANEL:  CBC Recent Labs  Lab 08/26/22 1758 08/27/22 0031 08/27/22 0804  WBC 8.0  --   --   HGB 6.2*   < > 7.4*  HCT 20.8*  --   --   PLT 220  --   --    < > = values in this interval not displayed.    Chemistries  Recent Labs  Lab 08/26/22 1758  NA 139  K 3.9  CL 103  CO2 27  GLUCOSE 149*  BUN 27*  CREATININE 5.48*  CALCIUM 8.5*  AST 13*  ALT 7  ALKPHOS 32  BILITOT 0.6    Assessment and Plan   Cynthia Dean is a 72 y.o. female with medical history significant for ESRD on HD MWF, insulin requiring T2DM, COPD on home 4 L home O2, CAD, carotid stenosis, seizure disorder, HTN, , prior CVA, GERD, GI bleeding 2020 with unremarkable EGD and colonoscopy,, left breast ca s/p partial mastectomy and left axillary dissection on 0/99/8338 complicated by left breast abscess with coag negative staph bacteremia as well as small bilateral PE 03/20/2022, currently on Eliquis, who was  sent from dialysis to the ED for evaluation of hemoglobin of 5.7, down from baseline of around 7.6-8.3  Rectal exam done in the ED showed brown stool that was guaiac positive.    Acute on chronic blood loss anemia Chronic anticoagulation with Eliquis History of rectal bleeding 01/2019 with unremarkable EGD and colonoscopy Anemia of ESRD with a baseline hemoglobin around 8.0 --Patient with hemoglobin of 5.7 and dialysis and positive guaiac in the ED. --Reports of occasional dark stool stool and symptomatic for fatigue -- 5.7--2 units BT--7.2 --Holding Eliquis (taking for PE since August 2023) --Continue Protonix infusion -- GI Dr. Virgina Jock for consult plans for endoscopy in am   Chronic anticoagulation History of PE 03/20/2022 S/p partial mastectomy for left breast cancer 03/04/2022 --Patient on chronic anticoagulation with Eliquis for PE developed following her partial mastectomy for breast cancer --Hold Eliquis in the setting of suspected GI bleeding   Chronic diastolic CHF (congestive heart failure) (HCC) --Clinically euvolemic --Continue losartan, metoprolol and torsemide   ESRD on hemodialysis [facility]) --Patient had dialysis on Friday 2/2 --Nephrology consult for continuation of dialysis--Dr Candiss Norse aware   CAD (coronary artery disease) --Continue aspirin, metoprolol, losartan and atorvastatin   Essential hypertension --Continue metoprolol and losartan   COPD (chronic obstructive pulmonary disease) ([place]) Chronic respiratory failure --Not currently exacerbated --Continue home inhalers and  baseline oxygen   Depression Continue sertraline and trazodone   Seizure disorder (HCC) --Continue Keppra   Breast cancer, left S/P partial mastectomy,03/04/2022 --Not followed by oncology.  No oncology follow up scheduled currently - her oncology team reached out for follow up but family declined" Patient's lumpectomy was complicated by abscess and staph bacteremia Patient says she does not  think she wants to pursue any further treatment   Insulin-requiring or dependent type II diabetes mellitus ([place]) --Sliding scale insulin coverage   History of CVA (cerebrovascular accident) --Continue aspirin and atorvastatin       DVT prophylaxis: SCD   Consults: GI, Dr. Virgina Jock and nephrology, Dr. Candiss Norse   Advance Care Planning:   FULL   Family Communication: none today. GI PA spoke with boyfriend   Disposition Plan: Back to previous home environment Level of care: Telemetry Medical Status is: Observation The patient remains OBS appropriate and will d/c before 2 midnights.    TOTAL TIME TAKING CARE OF THIS PATIENT: 35 minutes.  >50% time spent on counselling and coordination of care  Note: This dictation was prepared with Dragon dictation along with smaller phrase technology. Any transcriptional errors that result from this process are unintentional.  Fritzi Mandes M.D    [facility]   CC: Primary care physician; Lowella Bandy, MD

## 2022-08-27 NOTE — Plan of Care (Signed)
  Problem: Clinical Measurements: Goal: Will remain free from infection Outcome: Progressing   Problem: Activity: Goal: Risk for activity intolerance will decrease Outcome: Progressing   Problem: Nutrition: Goal: Adequate nutrition will be maintained Outcome: Progressing   Problem: Coping: Goal: Level of anxiety will decrease Outcome: Progressing   Problem: Pain Managment: Goal: General experience of comfort will improve Outcome: Progressing   Problem: Safety: Goal: Ability to remain free from injury will improve Outcome: Progressing   Problem: Skin Integrity: Goal: Risk for impaired skin integrity will decrease Outcome: Progressing   

## 2022-08-27 NOTE — Progress Notes (Addendum)
Daughter allowed this RN in presence of another RN to get consent from pt s fiance on ph because he is present at bedside.

## 2022-08-27 NOTE — Progress Notes (Signed)
Patient brought to the floor by RN on stretcher, alert and orientated. No distress Noted. IV intact to rt arm, restrictive left arm noted, dressing dry and intact to left fistula. Patient orientated to room, made comfortable in bed. Daughter crystal called stating her mom have dementia and cannot get up on her own, uses a wheelchair at home with assistance to pivot. Right de-access ported noted.

## 2022-08-28 ENCOUNTER — Encounter: Payer: Self-pay | Admitting: Internal Medicine

## 2022-08-28 ENCOUNTER — Observation Stay: Payer: 59 | Admitting: General Practice

## 2022-08-28 ENCOUNTER — Encounter
Admission: EM | Disposition: A | Discharge status: Skilled Nursing Facility | Payer: Self-pay | Source: Home / Self Care | Attending: Osteopathic Medicine

## 2022-08-28 DIAGNOSIS — K5731 Diverticulosis of large intestine without perforation or abscess with bleeding: Secondary | ICD-10-CM | POA: Diagnosis present

## 2022-08-28 DIAGNOSIS — G8191 Hemiplegia, unspecified affecting right dominant side: Secondary | ICD-10-CM | POA: Diagnosis not present

## 2022-08-28 DIAGNOSIS — N186 End stage renal disease: Secondary | ICD-10-CM | POA: Diagnosis present

## 2022-08-28 DIAGNOSIS — Z794 Long term (current) use of insulin: Secondary | ICD-10-CM | POA: Diagnosis not present

## 2022-08-28 DIAGNOSIS — N2581 Secondary hyperparathyroidism of renal origin: Secondary | ICD-10-CM | POA: Diagnosis present

## 2022-08-28 DIAGNOSIS — I132 Hypertensive heart and chronic kidney disease with heart failure and with stage 5 chronic kidney disease, or end stage renal disease: Secondary | ICD-10-CM | POA: Diagnosis present

## 2022-08-28 DIAGNOSIS — D62 Acute posthemorrhagic anemia: Secondary | ICD-10-CM | POA: Diagnosis present

## 2022-08-28 DIAGNOSIS — J9611 Chronic respiratory failure with hypoxia: Secondary | ICD-10-CM | POA: Diagnosis present

## 2022-08-28 DIAGNOSIS — D631 Anemia in chronic kidney disease: Secondary | ICD-10-CM | POA: Diagnosis present

## 2022-08-28 DIAGNOSIS — R710 Precipitous drop in hematocrit: Secondary | ICD-10-CM | POA: Diagnosis present

## 2022-08-28 DIAGNOSIS — E1122 Type 2 diabetes mellitus with diabetic chronic kidney disease: Secondary | ICD-10-CM | POA: Diagnosis present

## 2022-08-28 DIAGNOSIS — F0393 Unspecified dementia, unspecified severity, with mood disturbance: Secondary | ICD-10-CM | POA: Diagnosis present

## 2022-08-28 DIAGNOSIS — Z8616 Personal history of COVID-19: Secondary | ICD-10-CM | POA: Diagnosis not present

## 2022-08-28 DIAGNOSIS — F32A Depression, unspecified: Secondary | ICD-10-CM | POA: Diagnosis present

## 2022-08-28 DIAGNOSIS — Z7901 Long term (current) use of anticoagulants: Secondary | ICD-10-CM | POA: Diagnosis not present

## 2022-08-28 DIAGNOSIS — D649 Anemia, unspecified: Secondary | ICD-10-CM | POA: Diagnosis not present

## 2022-08-28 DIAGNOSIS — E1140 Type 2 diabetes mellitus with diabetic neuropathy, unspecified: Secondary | ICD-10-CM | POA: Diagnosis present

## 2022-08-28 DIAGNOSIS — E785 Hyperlipidemia, unspecified: Secondary | ICD-10-CM | POA: Diagnosis present

## 2022-08-28 DIAGNOSIS — I5032 Chronic diastolic (congestive) heart failure: Secondary | ICD-10-CM | POA: Diagnosis present

## 2022-08-28 DIAGNOSIS — K640 First degree hemorrhoids: Secondary | ICD-10-CM | POA: Diagnosis present

## 2022-08-28 DIAGNOSIS — E11319 Type 2 diabetes mellitus with unspecified diabetic retinopathy without macular edema: Secondary | ICD-10-CM | POA: Diagnosis present

## 2022-08-28 DIAGNOSIS — G40909 Epilepsy, unspecified, not intractable, without status epilepticus: Secondary | ICD-10-CM | POA: Diagnosis present

## 2022-08-28 DIAGNOSIS — J449 Chronic obstructive pulmonary disease, unspecified: Secondary | ICD-10-CM | POA: Diagnosis present

## 2022-08-28 DIAGNOSIS — D500 Iron deficiency anemia secondary to blood loss (chronic): Secondary | ICD-10-CM | POA: Diagnosis present

## 2022-08-28 DIAGNOSIS — I2510 Atherosclerotic heart disease of native coronary artery without angina pectoris: Secondary | ICD-10-CM | POA: Diagnosis present

## 2022-08-28 DIAGNOSIS — Z992 Dependence on renal dialysis: Secondary | ICD-10-CM | POA: Diagnosis not present

## 2022-08-28 HISTORY — PX: ESOPHAGOGASTRODUODENOSCOPY (EGD) WITH PROPOFOL: SHX5813

## 2022-08-28 LAB — POCT I-STAT, CHEM 8
BUN: 38 mg/dL — ABNORMAL HIGH (ref 8–23)
Calcium, Ion: 1.09 mmol/L — ABNORMAL LOW (ref 1.15–1.40)
Chloride: 103 mmol/L (ref 98–111)
Creatinine, Ser: 9.1 mg/dL — ABNORMAL HIGH (ref 0.44–1.00)
Glucose, Bld: 103 mg/dL — ABNORMAL HIGH (ref 70–99)
HCT: 23 % — ABNORMAL LOW (ref 36.0–46.0)
Hemoglobin: 7.8 g/dL — ABNORMAL LOW (ref 12.0–15.0)
Potassium: 4.6 mmol/L (ref 3.5–5.1)
Sodium: 138 mmol/L (ref 135–145)
TCO2: 23 mmol/L (ref 22–32)

## 2022-08-28 LAB — GLUCOSE, CAPILLARY
Glucose-Capillary: 99 mg/dL (ref 70–99)
Glucose-Capillary: 111 mg/dL — ABNORMAL HIGH (ref 70–99)
Glucose-Capillary: 159 mg/dL — ABNORMAL HIGH (ref 70–99)
Glucose-Capillary: 117 mg/dL — ABNORMAL HIGH (ref 70–99)

## 2022-08-28 LAB — IRON AND TIBC
Iron: 39 ug/dL (ref 28–170)
Saturation Ratios: 15 % (ref 10.4–31.8)
TIBC: 255 ug/dL (ref 250–450)
UIBC: 216 ug/dL

## 2022-08-28 LAB — FOLATE: Folate: 7.8 ng/mL (ref >5.9)

## 2022-08-28 LAB — VITAMIN B12: Vitamin B-12: 302 pg/mL (ref 180–914)

## 2022-08-28 LAB — HEMOGLOBIN AND HEMATOCRIT, BLOOD
HCT: 23.3 % — ABNORMAL LOW (ref 36.0–46.0)
Hemoglobin: 7.3 g/dL — ABNORMAL LOW (ref 12.0–15.0)

## 2022-08-28 LAB — HEPATITIS B SURFACE ANTIGEN: Hepatitis B Surface Ag: NONREACTIVE

## 2022-08-28 LAB — FERRITIN: Ferritin: 658 ng/mL — ABNORMAL HIGH (ref 11–307)

## 2022-08-28 LAB — MRSA NEXT GEN BY PCR, NASAL: MRSA by PCR Next Gen: NOT DETECTED

## 2022-08-28 SURGERY — ESOPHAGOGASTRODUODENOSCOPY (EGD) WITH PROPOFOL
Anesthesia: General

## 2022-08-28 MED ORDER — LOSARTAN POTASSIUM 50 MG PO TABS
100.0000 mg | ORAL_TABLET | Freq: Every day | ORAL | Status: DC
  Administered 2022-08-28 – 2022-09-08 (×12): 100 mg via ORAL
  Filled 2022-08-28 (×12): qty 2

## 2022-08-28 MED ORDER — SODIUM CHLORIDE 0.9 % IV SOLN: INTRAVENOUS | Status: DC

## 2022-08-28 MED ORDER — LIDOCAINE HCL (CARDIAC) PF 100 MG/5ML IV SOSY
PREFILLED_SYRINGE | INTRAVENOUS | Status: DC | PRN
  Administered 2022-08-28: 40 mg via INTRAVENOUS

## 2022-08-28 MED ORDER — ALTEPLASE 2 MG IJ SOLR: 2.0000 mg | Freq: Once | INTRAMUSCULAR | Status: DC | PRN

## 2022-08-28 MED ORDER — PHENYLEPHRINE 80 MCG/ML (10ML) SYRINGE FOR IV PUSH (FOR BLOOD PRESSURE SUPPORT)
PREFILLED_SYRINGE | INTRAVENOUS | Status: AC
  Filled 2022-08-28: qty 10

## 2022-08-28 MED ORDER — PROPOFOL 500 MG/50ML IV EMUL
INTRAVENOUS | Status: DC | PRN
  Administered 2022-08-28: 75 ug/kg/min via INTRAVENOUS

## 2022-08-28 MED ORDER — BISACODYL 5 MG PO TBEC
10.0000 mg | DELAYED_RELEASE_TABLET | Freq: Once | ORAL | Status: AC
  Administered 2022-08-28: 10 mg via ORAL
  Filled 2022-08-28: qty 2

## 2022-08-28 MED ORDER — PENTAFLUOROPROP-TETRAFLUOROETH EX AERO: 1.0000 | INHALATION_SPRAY | CUTANEOUS | Status: DC | PRN

## 2022-08-28 MED ORDER — LIDOCAINE HCL (PF) 1 % IJ SOLN: 5.0000 mL | INTRAMUSCULAR | Status: DC | PRN

## 2022-08-28 MED ORDER — CHLORHEXIDINE GLUCONATE CLOTH 2 % EX PADS
6.0000 | MEDICATED_PAD | Freq: Every day | CUTANEOUS | Status: DC
  Administered 2022-08-30 – 2022-09-09 (×11): 6 via TOPICAL

## 2022-08-28 MED ORDER — LIDOCAINE HCL (PF) 2 % IJ SOLN
INTRAMUSCULAR | Status: AC
  Filled 2022-08-28: qty 5

## 2022-08-28 MED ORDER — ANTICOAGULANT SODIUM CITRATE 4% (200MG/5ML) IV SOLN: 5.0000 mL | Status: DC | PRN

## 2022-08-28 MED ORDER — PROPOFOL 10 MG/ML IV BOLUS
INTRAVENOUS | Status: DC | PRN
  Administered 2022-08-28: 30 mg via INTRAVENOUS

## 2022-08-28 MED ORDER — HEPARIN SODIUM (PORCINE) 1000 UNIT/ML DIALYSIS: 1000.0000 [IU] | INTRAMUSCULAR | Status: DC | PRN

## 2022-08-28 MED ORDER — SERTRALINE HCL 50 MG PO TABS
25.0000 mg | ORAL_TABLET | Freq: Every day | ORAL | Status: DC
  Administered 2022-08-29 – 2022-09-09 (×11): 25 mg via ORAL
  Filled 2022-08-28 (×12): qty 1

## 2022-08-28 MED ORDER — LIDOCAINE-PRILOCAINE 2.5-2.5 % EX CREA: 1.0000 | TOPICAL_CREAM | CUTANEOUS | Status: DC | PRN

## 2022-08-28 MED ORDER — PROPOFOL 10 MG/ML IV BOLUS
INTRAVENOUS | Status: AC
  Filled 2022-08-28: qty 80

## 2022-08-28 MED ORDER — EPOETIN ALFA 4000 UNIT/ML IJ SOLN
4000.0000 [IU] | INTRAMUSCULAR | Status: DC
  Administered 2022-08-31 – 2022-09-02 (×2): 4000 [IU] via INTRAVENOUS
  Filled 2022-08-28 (×4): qty 1

## 2022-08-28 NOTE — Anesthesia Preprocedure Evaluation (Signed)
Anesthesia Evaluation  Patient identified by MRN, date of birth, ID band Patient awake    Reviewed: Allergy & Precautions, H&P , NPO status , Patient's Chart, lab work & pertinent test results  History of Anesthesia Complications Negative for: history of anesthetic complications  Airway Mallampati: III  TM Distance: >3 FB Neck ROM: limited    Dental  (+) Poor Dentition, Chipped, Missing, Partial Lower   Pulmonary COPD,  COPD inhaler and oxygen dependent, former smoker          Cardiovascular Exercise Tolerance: Good hypertension, (-) angina + CAD and +CHF  (-) Past MI and (-) DOE      Neuro/Psych Seizures -,  PSYCHIATRIC DISORDERS  Depression    CVA, Residual Symptoms    GI/Hepatic negative GI ROS, Neg liver ROS,neg GERD  ,,  Endo/Other  diabetes, Type 2, Insulin Dependent    Renal/GU DialysisRenal disease     Musculoskeletal   Abdominal   Peds  Hematology negative hematology ROS (+)   Anesthesia Other Findings Past Medical History: No date: COPD (chronic obstructive pulmonary disease) (HCC) No date: Coronary artery disease No date: Diabetes mellitus without complication (HCC) No date: ESRD on dialysis ([place]) No date: Heart murmur No date: Hyperlipidemia No date: Hypertension No date: Stroke [facility])  Past Surgical History: 12/20/2016: A/V FISTULAGRAM; Left     Comment:  Procedure: A/V Fistulagram;  Surgeon: Katha Cabal, MD;  Location: [place] [facility];  Service:               Cardiovascular;  Laterality: Left; 12/20/2016: A/V SHUNT INTERVENTION; N/A     Comment:  Procedure: A/V Shunt Intervention;  Surgeon: Katha Cabal, MD;  Location: [place] [facility];  Service:              Cardiovascular;  Laterality: N/A; 09/11/2017: A/V SHUNTOGRAM; Left     Comment:  Procedure: A/V SHUNTOGRAM;  Surgeon: Algernon Huxley, MD;                Location: [place] [facility];   Service: Cardiovascular;              Laterality: Left; 07/19/00: BREAST BIOPSY; Bilateral     Comment:  neg 11/16/2017: LOOP RECORDER INSERTION; N/A     Comment:  Procedure: LOOP RECORDER INSERTION;  Surgeon: Deboraha Sprang, MD;  Location: [place] [facility];  Service:               Cardiovascular;  Laterality: N/A; 02/02/2015: PERIPHERAL VASCULAR CATHETERIZATION; Left     Comment:  Procedure: A/V Shuntogram/Fistulagram;  Surgeon: Algernon Huxley, MD;  Location: [place] [facility];  Service:               Cardiovascular;  Laterality: Left; 02/02/2015: PERIPHERAL VASCULAR CATHETERIZATION; Left     Comment:  Procedure: A/V Shunt Intervention;  Surgeon: Algernon Huxley, MD;  Location: [place] [facility];  Service:               Cardiovascular;  Laterality: Left; 03/09/2015: PERIPHERAL VASCULAR CATHETERIZATION; Left  Comment:  Procedure: A/V Shuntogram/Fistulagram;  Surgeon: Algernon Huxley, MD;  Location: [place] [facility];  Service:               Cardiovascular;  Laterality: Left; 03/09/2015: PERIPHERAL VASCULAR CATHETERIZATION; N/A     Comment:  Procedure: A/V Shunt Intervention;  Surgeon: Algernon Huxley, MD;  Location: [place] [facility];  Service:               Cardiovascular;  Laterality: N/A; 04/17/2015: PERIPHERAL VASCULAR CATHETERIZATION; Left     Comment:  Procedure: Upper Extremity Angiography;  Surgeon: Algernon Huxley, MD;  Location: [place] [facility];  Service:               Cardiovascular;  Laterality: Left; 04/17/2015: PERIPHERAL VASCULAR CATHETERIZATION     Comment:  Procedure: Upper Extremity Intervention;  Surgeon: Algernon Huxley, MD;  Location: [place] [facility];  Service:               Cardiovascular;; 08/10/2015: PERIPHERAL VASCULAR CATHETERIZATION; N/A     Comment:  Procedure: A/V Shuntogram/Fistulagram;  Surgeon: Algernon Huxley, MD;  Location: [place] [facility];   Service:               Cardiovascular;  Laterality: N/A; 08/10/2015: PERIPHERAL VASCULAR CATHETERIZATION; N/A     Comment:  Procedure: A/V Shunt Intervention;  Surgeon: Algernon Huxley, MD;  Location: [place] [facility];  Service:               Cardiovascular;  Laterality: N/A; 04/11/2016: PERIPHERAL VASCULAR CATHETERIZATION; Left     Comment:  Procedure: A/V Shuntogram/Fistulagram;  Surgeon: Algernon Huxley, MD;  Location: [place] [facility];  Service:               Cardiovascular;  Laterality: Left; 11/16/2017: TEE WITHOUT CARDIOVERSION; N/A     Comment:  Procedure: TRANSESOPHAGEAL ECHOCARDIOGRAM (TEE);                Surgeon: Minna Merritts, MD;  Location: [facility];                Service: Cardiovascular;  Laterality: N/A;  BMI    Body Mass Index: 39.87 kg/m      Reproductive/Obstetrics negative OB ROS                             Anesthesia Physical Anesthesia Plan  ASA: 3  Anesthesia Plan: General   Post-op Pain Management:    Induction: Intravenous  PONV Risk Score and Plan: Ondansetron, Propofol infusion and TIVA  Airway Management Planned: Natural Airway and Nasal Cannula  Additional Equipment:   Intra-op Plan:   Post-operative Plan:   Informed Consent: I have reviewed the patients History and Physical, chart, labs and discussed the procedure including the risks, benefits and alternatives for the proposed anesthesia with the patient or authorized representative  who has indicated his/her understanding and acceptance.     Dental Advisory Given  Plan Discussed with: Anesthesiologist, CRNA and Surgeon  Anesthesia Plan Comments: (Patient consented for risks of anesthesia including but not limited to:  - adverse reactions to medications - risk of intubation if required - damage to teeth, lips or other oral mucosa - sore throat or hoarseness - Damage to heart, brain, lungs or loss of life  Patient voiced  understanding.)        Anesthesia Quick Evaluation

## 2022-08-28 NOTE — Progress Notes (Signed)
PROGRESS NOTE    Cynthia Dean  FGH:[id number] DOB: 1950/10/27 DOA: 08/26/2022 PCP: Lowella Bandy, MD  114A/114A-AA  LOS: 0 days   Brief hospital course:   Assessment & Plan:  Cynthia Dean is a 72 y.o. female with medical history significant for ESRD on HD MWF, insulin requiring T2DM, COPD on home 4 L home O2, CAD, carotid stenosis, seizure disorder, HTN, , prior CVA, GERD, GI bleeding 2020 with unremarkable EGD and colonoscopy,, left breast ca s/p partial mastectomy and left axillary dissection on 6/78/9381 complicated by left breast abscess with coag negative staph bacteremia as well as small bilateral PE 03/20/2022, currently on Eliquis, who was sent from dialysis to the ED for evaluation of hemoglobin of 5.7, down from baseline of around 7.6-8.3  Rectal exam done in the ED showed brown stool that was guaiac positive.     Acute on chronic blood loss anemia Anemia of chronic kidney disease Chronic anticoagulation with Eliquis History of rectal bleeding 01/2019 with unremarkable EGD and colonoscopy --baseline hemoglobin around 8.0 --Patient with hemoglobin of 5.7 and dialysis and positive guaiac in the ED. --Reports of occasional dark stool stool and symptomatic for fatigue -- s/p 2u pRBC Plan: --EGD today, unremarkable --d/c protonix gtt --cont oral protonix daily --Holding Eliquis (taking for PE since August 2023) --plan for colonoscopy on Tuesday (can not perform on Monday due to scheduled dialysis on Mon)  Chronic anticoagulation History of PE 03/20/2022 S/p partial mastectomy for left breast cancer 03/04/2022 --Patient on chronic anticoagulation with Eliquis for PE developed following her partial mastectomy for breast cancer --Hold Eliquis in the setting of suspected GI bleeding   Chronic diastolic CHF (congestive heart failure) (HCC) --Clinically euvolemic --cont home torsemide   ESRD on hemodialysis ([place]) --iHD per home schedule   CAD (coronary  artery disease) --Continue aspirin and atorvastatin   Essential hypertension --Continue metoprolol and torsemide --resume home losartan nightly   COPD (chronic obstructive pulmonary disease) ([place]) Chronic respiratory failure --Not currently exacerbated --Continue home inhalers and baseline oxygen   Depression Continue sertraline and trazodone   Seizure disorder ([place]) --Continue Keppra   Breast cancer, left S/P partial mastectomy 03/04/2022 --Not followed by oncology.  No oncology follow up scheduled currently - her oncology team reached out for follow up but family declined. Patient's lumpectomy was complicated by abscess and staph bacteremia Patient says she does not think she wants to pursue any further treatment   Insulin-requiring or dependent type II diabetes mellitus ([place]) --recent A1c 7.0 --d/c BG checks and SSI   History of CVA (cerebrovascular accident) --Continue aspirin and atorvastatin    DVT prophylaxis: SCD/Compression stockings Code Status: Full code  Family Communication: husband updated at bedside today Level of care: Med-Surg Dispo:   The patient is from: home Anticipated d/c is to: home Anticipated d/c date is: Tuesday   Subjective and Interval History:  No N/V abdominal pain.  Had diarrhea x2 episodes yesterday and x2 today.    EGD unremarkable.   Objective: Vitals:   08/28/22 0930 08/28/22 1018 08/28/22 1600 08/28/22 1602  BP: (!) 157/69 (!) 157/69 (!) 147/77 (!) 157/69  Pulse:  61 65 66  Resp:  '20 17 17  '$ Temp:  98 F (36.7 C) 98.1 F (36.7 C) 99.1 F (37.3 C)  TempSrc:  Oral  Oral  SpO2:  92% 100% 100%  Weight:      Height:        Intake/Output Summary (Last 24 hours) at 08/28/2022 1812 Last data  filed at 08/28/2022 1754 Gross per 24 hour  Intake 531.77 ml  Output --  Net 531.77 ml   Filed Weights   08/26/22 2344  Weight: 104.1 kg    Examination:   Constitutional: NAD, AAOx3 HEENT: conjunctivae and lids normal, EOMI CV:  No cyanosis.   RESP: normal respiratory effort, on RA Neuro: II - XII grossly intact.   Psych: Normal mood and affect.  Appropriate judgement and reason   Data Reviewed: I have personally reviewed labs and imaging studies  Time spent: 50 minutes  Enzo Bi, MD [facility] If 7PM-7AM, please contact night-coverage 08/28/2022, 6:12 PM

## 2022-08-28 NOTE — Op Note (Signed)
[facility] Patient Name: Cynthia Dean Procedure Date: 08/28/2022 8:20 AM MRN: [id number] Account #: [id number] Date of Birth: 1951-01-30 Admit Type: Inpatient Age: 72 Room: [facility] ENDO ROOM 4 Gender: Female Note Status: Finalized Instrument Name: Upper Endoscope [id number] Procedure:             Upper GI endoscopy Indications:           Iron deficiency anemia Providers:             Annamaria Helling DO, DO Referring MD:          Lowella Bandy MD, MD (Referring MD) Medicines:             Monitored Anesthesia Care Complications:         No immediate complications. Estimated blood loss: None. Procedure:             Pre-Anesthesia Assessment:                        - Prior to the procedure, a History and Physical was                         performed, and patient medications and allergies were                         reviewed. The patient is competent. The risks and                         benefits of the procedure and the sedation options and                         risks were discussed with the patient. All questions                         were answered and informed consent was obtained.                         Patient identification and proposed procedure were                         verified by the physician, the nurse, the anesthetist                         and the technician in the endoscopy suite. Mental                         Status Examination: alert and oriented. Airway                         Examination: normal oropharyngeal airway and neck                         mobility. Respiratory Examination: clear to                         auscultation. CV Examination: RRR, no murmurs, no S3                         or S4. Prophylactic Antibiotics: The patient does not  require prophylactic antibiotics. Prior                         Anticoagulants: The patient has taken Eliquis                         (apixaban), last  dose was 2 days prior to procedure.                         ASA Grade Assessment: III - A patient with severe                         systemic disease. After reviewing the risks and                         benefits, the patient was deemed in satisfactory                         condition to undergo the procedure. The anesthesia                         plan was to use monitored anesthesia care (MAC).                         Immediately prior to administration of medications,                         the patient was re-assessed for adequacy to receive                         sedatives. The heart rate, respiratory rate, oxygen                         saturations, blood pressure, adequacy of pulmonary                         ventilation, and response to care were monitored                         throughout the procedure. The physical status of the                         patient was re-assessed after the procedure.                        After obtaining informed consent, the endoscope was                         passed under direct vision. Throughout the procedure,                         the patient's blood pressure, pulse, and oxygen                         saturations were monitored continuously. The Endoscope                         was introduced through the mouth, and advanced to the  second part of duodenum. The upper GI endoscopy was                         accomplished without difficulty. The patient tolerated                         the procedure well. Findings:      The duodenal bulb, first portion of the duodenum and second portion of       the duodenum were normal. Estimated blood loss: none.      A medium amount of food (residue) was found on the greater curvature of       the stomach. To thick to suction. interfered with some visualization.      The exam of the stomach was otherwise normal.      The Z-line was regular. Estimated blood loss: none.       Esophagogastric landmarks were identified: the gastroesophageal junction       was found at 40 cm from the incisors.      The exam of the esophagus was otherwise normal.      No fresh or old blood appreciated on this exam Impression:            - Normal duodenal bulb, first portion of the duodenum                         and second portion of the duodenum.                        - A medium amount of food (residue) in the stomach.                        - Z-line regular.                        - Esophagogastric landmarks identified.                        - No specimens collected. Recommendation:        - Return patient to hospital ward for ongoing care.                        - Clear liquid diet.                        - Continue present medications.                        - Use Protonix (pantoprazole) 40 mg PO daily.                        - Will discuss possible colonoscopy with the patient                        - The findings and recommendations were discussed with                         the patient.                        - The findings and recommendations were discussed with  the patient's family.                        - The findings and recommendations were discussed with                         the referring physician. Procedure Code(s):     --- Professional ---                        [phone], Esophagogastroduodenoscopy, flexible,                         transoral; diagnostic, including collection of                         specimen(s) by brushing or washing, when performed                         (separate procedure) Diagnosis Code(s):     --- Professional ---                        D50.9, Iron deficiency anemia, unspecified CPT copyright 2022 American Medical Association. All rights reserved. The codes documented in this report are preliminary and upon coder review may  be revised to meet current compliance requirements. Attending Participation:      I  personally performed the entire procedure. Volney American, DO Annamaria Helling DO, DO 08/28/2022 8:48:06 AM This report has been signed electronically. Number of Addenda: 0 Note Initiated On: 08/28/2022 8:20 AM Estimated Blood Loss:  Estimated blood loss: none.      [facility]

## 2022-08-28 NOTE — Anesthesia Postprocedure Evaluation (Signed)
Anesthesia Post Note  [name]  Procedure(s) Performed: ESOPHAGOGASTRODUODENOSCOPY (EGD) WITH PROPOFOL  Patient location during evaluation: Endoscopy Anesthesia Type: General Level of consciousness: awake and alert Pain management: pain level controlled Vital Signs Assessment: post-procedure vital signs reviewed and stable Respiratory status: spontaneous breathing, nonlabored ventilation, respiratory function stable and patient connected to nasal cannula oxygen Cardiovascular status: blood pressure returned to baseline and stable Postop Assessment: no apparent nausea or vomiting Anesthetic complications: no  No notable events documented.   Last Vitals:  Vitals:   08/28/22 0857 08/28/22 0912  BP: 138/69 (!) 161/67  Pulse: 64   Resp: 16   Temp: 36.8 C   SpO2: 95%     Last Pain:  Vitals:   08/28/22 0759  TempSrc: Temporal  PainSc: 0-No pain                 Dimas Millin

## 2022-08-28 NOTE — Interval H&P Note (Signed)
History and Physical Interval Note:   Consult note H&P from 08/27/22 was reviewed. Pt with black bm yesterday. Responded appropriately to two units prbc (now 7.4). Vital signs have remained stable. She has been off [place] for 48 hours at this point. Will proceed with upper endoscopy today. Please see op report for further details.  Esophagogastroduodenoscopy with possible biopsy, control of bleeding, polypectomy, and interventions as necessary has been discussed with the patient/patient representative. Informed consent was obtained from the patient/patient representative after explaining the indication, nature, and risks of the procedure including but not limited to death, bleeding, perforation, missed neoplasm/lesions, cardiorespiratory compromise, and reaction to medications. Opportunity for questions was given and appropriate answers were provided. Patient/patient representative has verbalized understanding is amenable to undergoing the procedure.  Written consent was obtained from the patient after discussion of risks, benefits, and alternatives. Patient has consented to proceed with Esophagogastroduodenoscopy with possible intervention    08/28/2022 8:05 AM  Cynthia Dean  has presented today for surgery, with the diagnosis of Symptomatic anemia, iron-deficiency anemia, melena.  The various methods of treatment have been discussed with the patient and family. After consideration of risks, benefits and other options for treatment, the patient has consented to  Procedure(s): ESOPHAGOGASTRODUODENOSCOPY (EGD) WITH PROPOFOL (N/A) as a surgical intervention.  The patient's history has been reviewed, patient examined, no change in status, stable for surgery.  I have reviewed the patient's chart and labs.  Questions were answered to the patient's satisfaction.     Annamaria Helling

## 2022-08-28 NOTE — Progress Notes (Signed)
[facility]  ROUNDING NOTE   Subjective:   Cynthia Dean s is a 72 y.o. 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 Patient presents to ED from dialysis with abnormal labs, hgb 5.7. She has been admitted under observation for Chronic blood loss anemia [D50.0] Gastrointestinal hemorrhage, unspecified gastrointestinal hemorrhage type [K92.2] Anemia, unspecified type [D64.9]  Patient is known to our practice and receives outpatient dialysis treatments at [facility] on a MWF schedule, supervised by Dr. Holley Raring.  She received a full treatment prior to ED arrival. Patient denies increased fatigue and shortness of breath. Denies any signs of bleeding.   Brown stool positive for blood in ED. Hgb 5.7 on ED arrival and patient received 2 unit blood transfusion. Hgb now 7.3. GI consulted   Objective:  Vital signs in last 24 hours:  Temp:  [96.9 F (36.1 C)-98.6 F (37 C)] 98 F (36.7 C) (02/04 1018) Pulse Rate:  [61-66] 61 (02/04 1018) Resp:  [16-20] 20 (02/04 1018) BP: (138-161)/(65-77) 157/69 (02/04 1018) SpO2:  [92 %-97 %] 92 % (02/04 1018)  Weight change:  Filed Weights   08/26/22 2344  Weight: 104.1 kg    Intake/Output: I/O last 3 completed shifts: In: 1001.6 [P.O.:200; I.V.:211.6; Blood:590] Out: 200 [Urine:200]   Intake/Output this shift:  Total I/O In: 200 [I.V.:200] Out: -   Physical Exam: General: NAD  Head: Normocephalic, atraumatic. Moist oral mucosal membranes  Eyes: Anicteric  Lungs:  Clear to auscultation, normal effort  Heart: Regular rate and rhythm  Abdomen:  Soft, nontender  Extremities:  Trace peripheral edema.  Neurologic: Nonfocal, moving all four extremities  Skin: No lesions  Access: Lt AVG    Basic Metabolic Panel: Recent Labs  Lab 08/26/22 1758 08/28/22 0827  NA 139 138  K 3.9 4.6  CL  103 103  CO2 27  --   GLUCOSE 149* 103*  BUN 27* 38*  CREATININE 5.48* 9.10*  CALCIUM 8.5*  --     Liver Function Tests: Recent Labs  Lab 08/26/22 1758  AST 13*  ALT 7  ALKPHOS 89  BILITOT 0.6  PROT 7.1  ALBUMIN 3.1*   No results for input(s): "LIPASE", "AMYLASE" in the last 168 hours. No results for input(s): "AMMONIA" in the last 168 hours.  CBC: Recent Labs  Lab 08/26/22 0930 08/26/22 1758 08/27/22 0031 08/27/22 0804 08/28/22 0827 08/28/22 1103  WBC  --  8.0  --   --   --   --   HGB 5.7* 6.2* 6.2* 7.4* 7.8* 7.3*  HCT 19.0* 20.8*  --   --  23.0* 23.3*  MCV  --  85.2  --   --   --   --   PLT  --  220  --   --   --   --     Cardiac Enzymes: No results for input(s): "CKTOTAL", "CKMB", "CKMBINDEX", "TROPONINI" in the last 168 hours.  BNP: Invalid input(s): "POCBNP"  CBG: Recent Labs  Lab 08/26/22 2251 08/27/22 0003 08/28/22 1014 08/28/22 1213  GLUCAP 135* 114* 99 111*    Microbiology: Results for orders placed or performed during the hospital encounter of 06/27/22  Resp Panel by RT-PCR (Flu A&B, Covid) Anterior Nasal Swab     Status: None   Collection Time: 06/27/22  6:52 PM   Specimen: Anterior Nasal Swab  Result Value Ref Range Status   SARS Coronavirus 2 by  RT PCR NEGATIVE NEGATIVE Final    Comment: (NOTE) SARS-CoV-2 target nucleic acids are NOT DETECTED.  The SARS-CoV-2 RNA is generally detectable in upper respiratory specimens during the acute phase of infection. The lowest concentration of SARS-CoV-2 viral copies this assay can detect is 138 copies/mL. A negative result does not preclude SARS-Cov-2 infection and should not be used as the sole basis for treatment or other patient management decisions. A negative result may occur with  improper specimen collection/handling, submission of specimen other than nasopharyngeal swab, presence of viral mutation(s) within the areas targeted by this assay, and inadequate number of viral copies(<138  copies/mL). A negative result must be combined with clinical observations, patient history, and epidemiological information. The expected result is Negative.  Fact Sheet for Patients:  EntrepreneurPulse.com.au  Fact Sheet for Healthcare Providers:  IncredibleEmployment.be  This test is no t yet approved or cleared by the [place] FDA and  has been authorized for detection and/or diagnosis of SARS-CoV-2 by FDA under an Emergency Use Authorization (EUA). This EUA will remain  in effect (meaning this test can be used) for the duration of the COVID-19 declaration under Section 564(b)(1) of the Act, 21 U.S.C.section 360bbb-3(b)(1), unless the authorization is terminated  or revoked sooner.       Influenza A by PCR NEGATIVE NEGATIVE Final   Influenza B by PCR NEGATIVE NEGATIVE Final    Comment: (NOTE) The Xpert Xpress SARS-CoV-2/FLU/RSV plus assay is intended as an aid in the diagnosis of influenza from Nasopharyngeal swab specimens and should not be used as a sole basis for treatment. Nasal washings and aspirates are unacceptable for Xpert Xpress SARS-CoV-2/FLU/RSV testing.  Fact Sheet for Patients: EntrepreneurPulse.com.au  Fact Sheet for Healthcare Providers: IncredibleEmployment.be  This test is not yet approved or cleared by the [place] FDA and has been authorized for detection and/or diagnosis of SARS-CoV-2 by FDA under an Emergency Use Authorization (EUA). This EUA will remain in effect (meaning this test can be used) for the duration of the COVID-19 declaration under Section 564(b)(1) of the Act, 21 U.S.C. section 360bbb-3(b)(1), unless the authorization is terminated or revoked.  Performed at [facility], [address] [place], [place] [address]     Coagulation Studies: No results for input(s): "LABPROT", "INR" in the last 72 hours.  Urinalysis: No results for input(s):  "COLORURINE", "LABSPEC", "PHURINE", "GLUCOSEU", "HGBUR", "BILIRUBINUR", "KETONESUR", "PROTEINUR", "UROBILINOGEN", "NITRITE", "LEUKOCYTESUR" in the last 72 hours.  Invalid input(s): "APPERANCEUR"    Imaging: No results found.   Medications:    sodium chloride      sodium chloride   Intravenous Once   sodium chloride   Intravenous Once   aspirin EC  81 mg Oral Daily   atorvastatin  80 mg Oral QPM   cinacalcet  30 mg Oral Q breakfast   gabapentin  300 mg Oral BID   insulin aspart  0-15 Units Subcutaneous Q4H   Ipratropium-Albuterol  1 puff Inhalation Q6H   levETIRAcetam  1,000 mg Oral Daily   [START ON 08/29/2022] levETIRAcetam  250 mg Oral Once per day on Mon Wed Fri   metoprolol succinate  100 mg Oral Daily   pantoprazole  40 mg Oral Daily   torsemide  100 mg Oral Daily   traZODone  50 mg Oral QHS   acetaminophen **OR** acetaminophen, albuterol, HYDROcodone-acetaminophen  Assessment/ Plan:  [name] is a 72 y.o.  female with past medical conditions including CAD, COPD with intermittent home O2, hypertension, hyperlipidemia, stroke, diabetes  and end stage renal disease on hemodialysis. Patient presents to ED from dialysis with abnormal labs, hgb 5.7. She has been admitted under observation for Chronic blood loss anemia [D50.0] Gastrointestinal hemorrhage, unspecified gastrointestinal hemorrhage type [K92.2] Anemia, unspecified type [D64.9]  [facility]/MWF/Lt AVG   Anemia of chronic kidney disease with acute blood loss Lab Results  Component Value Date   HGB 7.3 (L) 08/28/2022   Hgb on admission 5.7 with positive guaiac. Patient has received 2 unit blood transfusion. GI consulted and EGD completed today, no signs of active bleeding. Will schedule colonoscopy for Tuesday.   2. End stage renal disease on hemodialysis. Next treatment scheduled for Monday. Hepatitis B labs ordered per hospital protocol.   3. Hypertension with chronic kidney disease.  Home regimen includes losartan, metoprolol, and torsemide. Currently receiving these medications.  Blood pressure acceptable for this patient.   4. Secondary Hyperparathyroidism: with outpatient labs: PTH 613, phosphorus 4.8, calcium 9.0 on 03/07/22.    :  Lab Results  Component Value Date   PTH 131 (H) 03/14/2018   CALCIUM 8.5 (L) 08/26/2022   CAION 1.09 (L) 08/28/2022   PHOS 6.5 (H) 05/11/2022    Will continue to monitor bone minerals during this admission   LOS: 0 Whitney 2/4/202412:14 PM

## 2022-08-28 NOTE — Care Plan (Signed)
GI Post op update  No signs of bleeding on egd today Hgb stable at 7.8 today post transfusion (baseline ~8) Dark bm yesterday per nursing Recommend iv iron infusions given deficiency (ferritin 4) Can transition to once daily po protonix 40 mg for gastric protection Continue to hold eliquis at this time She receives HD on MWF. Colonoscopy will be planned to be performed on Tuesday Monitor H&H.  Transfusion and resuscitation as per primary team Avoid frequent lab draws to prevent lab induced anemia Supportive care and antiemetics as per primary team Maintain two sites IV access Avoid nsaids Monitor for GIB. Full liquids today. Clears tomorrow.  Patient's significant other, Cynthia Dean, updated by phone per her request Discussed case with hospitalist  Will continue to follow  Ronne Binning, [facility]

## 2022-08-28 NOTE — Transfer of Care (Signed)
Immediate Anesthesia Transfer of Care Note  [name]  Procedure(s) Performed: ESOPHAGOGASTRODUODENOSCOPY (EGD) WITH PROPOFOL  Patient Location: PACU  Anesthesia Type:General  Level of Consciousness: drowsy  Airway & Oxygen Therapy: Patient connected to nasal cannula oxygen  Post-op Assessment: Report given to RN  Post vital signs: stable  Last Vitals:  Vitals Value Taken Time  BP 112/83   Temp 98.2   Pulse 68   Resp 22   SpO2 91     Last Pain:  Vitals:   08/28/22 0759  TempSrc: Temporal  PainSc: 0-No pain         Complications: No notable events documented.

## 2022-08-28 NOTE — Plan of Care (Signed)

## 2022-08-29 ENCOUNTER — Encounter: Payer: Self-pay | Admitting: Gastroenterology

## 2022-08-29 DIAGNOSIS — D62 Acute posthemorrhagic anemia: Secondary | ICD-10-CM | POA: Diagnosis not present

## 2022-08-29 LAB — CBC
HCT: 22.4 % — ABNORMAL LOW (ref 36.0–46.0)
Hemoglobin: 7 g/dL — ABNORMAL LOW (ref 12.0–15.0)
MCH: 26.3 pg (ref 26.0–34.0)
MCHC: 31.3 g/dL (ref 30.0–36.0)
MCV: 84.2 fL (ref 80.0–100.0)
Platelets: 159 10*3/uL (ref 150–400)
RBC: 2.66 MIL/uL — ABNORMAL LOW (ref 3.87–5.11)
RDW: 17.2 % — ABNORMAL HIGH (ref 11.5–15.5)
WBC: 7.7 10*3/uL (ref 4.0–10.5)
nRBC: 0 % (ref 0.0–0.2)

## 2022-08-29 LAB — PREPARE RBC (CROSSMATCH)

## 2022-08-29 LAB — BASIC METABOLIC PANEL
Anion gap: 14 (ref 5–15)
BUN: 62 mg/dL — ABNORMAL HIGH (ref 8–23)
CO2: 21 mmol/L — ABNORMAL LOW (ref 22–32)
Calcium: 8.2 mg/dL — ABNORMAL LOW (ref 8.9–10.3)
Chloride: 102 mmol/L (ref 98–111)
Creatinine, Ser: 9.05 mg/dL — ABNORMAL HIGH (ref 0.44–1.00)
GFR, Estimated: 4 mL/min — ABNORMAL LOW (ref >60)
Glucose, Bld: 110 mg/dL — ABNORMAL HIGH (ref 70–99)
Potassium: 4.8 mmol/L (ref 3.5–5.1)
Sodium: 137 mmol/L (ref 135–145)

## 2022-08-29 LAB — GLUCOSE, CAPILLARY
Glucose-Capillary: 101 mg/dL — ABNORMAL HIGH (ref 70–99)
Glucose-Capillary: 101 mg/dL — ABNORMAL HIGH (ref 70–99)
Glucose-Capillary: 108 mg/dL — ABNORMAL HIGH (ref 70–99)
Glucose-Capillary: 113 mg/dL — ABNORMAL HIGH (ref 70–99)
Glucose-Capillary: 130 mg/dL — ABNORMAL HIGH (ref 70–99)
Glucose-Capillary: 145 mg/dL — ABNORMAL HIGH (ref 70–99)
Glucose-Capillary: 92 mg/dL (ref 70–99)
Glucose-Capillary: 97 mg/dL (ref 70–99)
Glucose-Capillary: 98 mg/dL (ref 70–99)
Glucose-Capillary: 99 mg/dL (ref 70–99)
Glucose-Capillary: 131 mg/dL — ABNORMAL HIGH (ref 70–99)

## 2022-08-29 LAB — HEPATITIS B SURFACE ANTIBODY, QUANTITATIVE: Hep B S AB Quant (Post): 4.1 m[IU]/mL — ABNORMAL LOW (ref >9.9)

## 2022-08-29 LAB — MAGNESIUM: Magnesium: 1.8 mg/dL (ref 1.7–2.4)

## 2022-08-29 LAB — PHOSPHORUS: Phosphorus: 5.5 mg/dL — ABNORMAL HIGH (ref 2.5–4.6)

## 2022-08-29 MED ORDER — SODIUM CHLORIDE 0.9% IV SOLUTION: Freq: Once | INTRAVENOUS | Status: DC

## 2022-08-29 MED ORDER — EPOETIN ALFA 4000 UNIT/ML IJ SOLN
INTRAMUSCULAR | Status: AC
  Administered 2022-08-29: 4000 [IU] via INTRAVENOUS
  Filled 2022-08-29: qty 1

## 2022-08-29 MED ORDER — PEG 3350-KCL-NA BICARB-NACL 420 G PO SOLR
4000.0000 mL | Freq: Once | ORAL | Status: AC
  Administered 2022-08-29: 4000 mL via ORAL
  Filled 2022-08-29: qty 4000

## 2022-08-29 MED ORDER — PEG 3350-KCL-NA BICARB-NACL 420 G PO SOLR: 2000.0000 mL | Freq: Once | ORAL | Status: DC | PRN

## 2022-08-29 NOTE — Progress Notes (Signed)
Received patient in bed to unit.  Alert and oriented.  Informed consent signed and in chart.   TX duration: 3.5 hours  Patient tolerated well.  Transported back to the room  Alert, without acute distress.  Hand-off given to patient's nurse.   Access used:  LeftAVG Access issues: none  Total UF removed: 800 ml Medication(s) given: Epogen 4000 units.     [facility]

## 2022-08-29 NOTE — Progress Notes (Addendum)
1532: Started 1 Unit of RBC as ordered. Monitored for Blood Transfusion reactions.  1611: blood transfusion ended. No signs and symptoms of transfusion reactions.

## 2022-08-29 NOTE — Progress Notes (Signed)
Inpatient progress note Patient ID: Cynthia Dean is a 72 y.o. female.  Interval hx Pt with diarrhea. Dark stools per nursing. Infectious w/u pending Pt seen on HD. No abdominal pain/n/v. Plan for colonoscopy tmrw No other acute gi complaints.  Past Medical History:  Diagnosis Date   Acute on chronic respiratory failure with hypoxia ([place]) 07/30/2019   Acute respiratory failure with hypoxia ([place]) 12/31/2018   Carotid arterial disease ([place])    a. 02/2018 < bilat ICA stenosis.   COPD (chronic obstructive pulmonary disease) ([facility])    Coronary artery disease    a. 11/2015 MV: EF 57%, no ischemia/infarct.   Cryptogenic stroke ([place])    a. 10/2017 s/p implantable loop recorder (no Afib to date).   Diabetes mellitus without complication ([place])    Diarrhea    ESRD on dialysis [facility])    GI bleed    a. 01/2019 EGD/Colonoscopy: Mild gastritis. Colitis.   Heart murmur    a. 12/2018 Echo: EF 55-60%, impaired relaxation. Mod dil LA. Mildly dil RA. Mild Ca2+ of AoV.   Hematemesis 06/27/2017   History of 2019 novel coronavirus disease (COVID-19) 07/30/2019   Hyperkalemia    Hyperlipidemia    Hypertension    Intractable nausea and vomiting 08/08/2015   Nausea & vomiting 05/30/2018   Nausea and vomiting 05/29/2018   Pneumonia due to COVID-19 virus     Past Surgical History:  Procedure Laterality Date   A/V FISTULAGRAM Left 12/20/2016   Procedure: A/V Fistulagram;  Surgeon: Katha Cabal, MD;  Location: [place] [facility];  Service: Cardiovascular;  Laterality: Left;   A/V SHUNT INTERVENTION N/A 12/20/2016   Procedure: A/V Shunt Intervention;  Surgeon: Katha Cabal, MD;  Location: [place] [facility];  Service: Cardiovascular;  Laterality: N/A;   A/V SHUNTOGRAM Left 09/11/2017   Procedure: A/V SHUNTOGRAM;  Surgeon: Algernon Huxley, MD;  Location: [place] [facility];  Service: Cardiovascular;  Laterality: Left;   A/V SHUNTOGRAM Left 11/21/2019   Procedure: A/V SHUNTOGRAM;  Surgeon:  Algernon Huxley, MD;  Location: [place] [facility];  Service: Cardiovascular;  Laterality: Left;   BREAST BIOPSY Bilateral 07/19/2000   neg   BREAST BIOPSY Left 11/03/2021   [place] bx mass at 3:00, venus marker, axilla bx-hyrdo marker, path pending   COLONOSCOPY WITH PROPOFOL N/A 02/16/2019   Procedure: COLONOSCOPY WITH PROPOFOL;  Surgeon: Toledo, Benay Pike, MD;  Location: [facility];  Service: Gastroenterology;  Laterality: N/A;   ESOPHAGOGASTRODUODENOSCOPY (EGD) WITH PROPOFOL N/A 02/16/2019   Procedure: ESOPHAGOGASTRODUODENOSCOPY (EGD) WITH PROPOFOL;  Surgeon: Toledo, Benay Pike, MD;  Location: [facility];  Service: Gastroenterology;  Laterality: N/A;   ESOPHAGOGASTRODUODENOSCOPY (EGD) WITH PROPOFOL N/A 08/28/2022   Procedure: ESOPHAGOGASTRODUODENOSCOPY (EGD) WITH PROPOFOL;  Surgeon: Annamaria Helling, DO;  Location: [place];  Service: Gastroenterology;  Laterality: N/A;   IR US GUIDE BX ASP/DRAIN  03/25/2022   LOOP RECORDER INSERTION N/A 11/16/2017   Procedure: LOOP RECORDER INSERTION;  Surgeon: Deboraha Sprang, MD;  Location: [place] [facility];  Service: Cardiovascular;  Laterality: N/A;   PERIPHERAL VASCULAR CATHETERIZATION Left 02/02/2015   Procedure: A/V Shuntogram/Fistulagram;  Surgeon: Algernon Huxley, MD;  Location: [place] [facility];  Service: Cardiovascular;  Laterality: Left;   PERIPHERAL VASCULAR CATHETERIZATION Left 02/02/2015   Procedure: A/V Shunt Intervention;  Surgeon: Algernon Huxley, MD;  Location: [place] [facility];  Service: Cardiovascular;  Laterality: Left;   PERIPHERAL VASCULAR CATHETERIZATION Left 03/09/2015   Procedure: A/V Shuntogram/Fistulagram;  Surgeon: Algernon Huxley, MD;  Location: [place] [facility];  Service: Cardiovascular;  Laterality: Left;   PERIPHERAL VASCULAR CATHETERIZATION N/A 03/09/2015   Procedure: A/V Shunt Intervention;  Surgeon: Algernon Huxley, MD;  Location: [place] [facility];  Service: Cardiovascular;  Laterality: N/A;   PERIPHERAL  VASCULAR CATHETERIZATION Left 04/17/2015   Procedure: Upper Extremity Angiography;  Surgeon: Algernon Huxley, MD;  Location: [place] [facility];  Service: Cardiovascular;  Laterality: Left;   PERIPHERAL VASCULAR CATHETERIZATION  04/17/2015   Procedure: Upper Extremity Intervention;  Surgeon: Algernon Huxley, MD;  Location: [place] [facility];  Service: Cardiovascular;;   PERIPHERAL VASCULAR CATHETERIZATION N/A 08/10/2015   Procedure: A/V Shuntogram/Fistulagram;  Surgeon: Algernon Huxley, MD;  Location: [place] [facility];  Service: Cardiovascular;  Laterality: N/A;   PERIPHERAL VASCULAR CATHETERIZATION N/A 08/10/2015   Procedure: A/V Shunt Intervention;  Surgeon: Algernon Huxley, MD;  Location: [place] [facility];  Service: Cardiovascular;  Laterality: N/A;   PERIPHERAL VASCULAR CATHETERIZATION Left 04/11/2016   Procedure: A/V Shuntogram/Fistulagram;  Surgeon: Algernon Huxley, MD;  Location: [place] [facility];  Service: Cardiovascular;  Laterality: Left;   TEE WITHOUT CARDIOVERSION N/A 11/16/2017   Procedure: TRANSESOPHAGEAL ECHOCARDIOGRAM (TEE);  Surgeon: Minna Merritts, MD;  Location: [facility];  Service: Cardiovascular;  Laterality: N/A;    Allergies  Allergen Reactions   Oxycodone Nausea And Vomiting   Oxycodone-Acetaminophen Other (See Comments)   Wound Dressing Adhesive    Hydrocodone     Intolerant more than allergic   Tape Itching    Skin Dermatitis/itching (tape adhesive) Skin Dermatitis/itching (tape adhesive)   Tapentadol Itching    Skin Dermatitis/itching (tape adhesive) Skin Dermatitis/itching (tape adhesive)     Family History  Problem Relation Age of Onset   Breast cancer Father 30   Stroke Mother    Heart attack Mother    Heart Problems Sister    Breast cancer Cousin 7       1 st cousin. Maternal     Social History   Tobacco Use   Smoking status: Former    Packs/day: 0.00    Types: Cigarettes   Smokeless tobacco: Never  Vaping Use   Vaping Use: Never used   Substance Use Topics   Alcohol use: No   Drug use: No     Pertinent GI related history and allergies were reviewed with the patient  Review of Systems  Constitutional:  Negative for activity change, appetite change, chills, diaphoresis, fatigue, fever and unexpected weight change.  HENT:  Negative for trouble swallowing and voice change.   Respiratory:  Negative for shortness of breath and wheezing.   Cardiovascular:  Negative for chest pain, palpitations and leg swelling.  Gastrointestinal:  Positive for blood in stool (dark stools) and diarrhea. Negative for abdominal distention, abdominal pain, anal bleeding, constipation, nausea, rectal pain and vomiting.  Skin:  Negative for color change and pallor.  Neurological:  Negative for dizziness, syncope and weakness.  Psychiatric/Behavioral:  Negative for confusion.   All other systems reviewed and are negative.    Medications Home Medications No current facility-administered medications on file prior to encounter.   Current Outpatient Medications on File Prior to Encounter  Medication Sig Dispense Refill   acetaminophen (TYLENOL) 500 MG tablet Take 1 tablet (500 mg total) by mouth every 6 (six) hours as needed. 30 tablet 1   apixaban (ELIQUIS) 5 MG TABS tablet Take 1 tablet (5 mg total) by mouth 2 (two) times  daily. 60 tablet 0   aspirin EC 81 MG tablet Take 1 tablet (81 mg total) by mouth daily. Swallow whole. 30 tablet 1   atorvastatin (LIPITOR) 80 MG tablet Take 1 tablet (80 mg total) by mouth every evening. 30 tablet 0   cinacalcet (SENSIPAR) 30 MG tablet Take 1 tablet (30 mg total) by mouth daily with breakfast. 60 tablet 1   diphenoxylate-atropine (LOMOTIL) 2.5-0.025 MG tablet SMARTSIG:1 Tablet(s) By Mouth Every 12 Hours PRN 30 tablet 1   fluticasone (FLONASE) 50 MCG/ACT nasal spray Place 1 spray into both nostrils daily. 1 g 0   gabapentin (NEURONTIN) 300 MG capsule Take 1 capsule (300 mg total) by mouth 2 (two) times daily.  60 capsule 0   glucose 4 GM chewable tablet Chew 1 tablet (4 g total) by mouth as needed for low blood sugar. 50 tablet 1   insulin NPH-regular Human (NOVOLIN 70/30) (70-30) 100 UNIT/ML injection Inject 28 Units into the skin 2 (two) times daily with a meal. 10 mL 1   Ipratropium-Albuterol (COMBIVENT) 20-100 MCG/ACT AERS respimat Inhale 1 puff into the lungs every 6 (six) hours. 4 g 0   levETIRAcetam (KEPPRA) 1000 MG tablet Take 1 tablet (1,000 mg total) by mouth daily. 30 tablet 0   levETIRAcetam (KEPPRA) 250 MG tablet Take 1 tablet (250 mg total) by mouth 3 (three) times a week. 12 tablet 0   lidocaine (LIDODERM) 5 % Place 1 patch onto the skin daily. Remove & Discard patch within 12 hours or as directed by MD 30 patch 0   metoprolol succinate (TOPROL-XL) 100 MG 24 hr tablet Take 1 tablet (100 mg total) by mouth daily. 30 tablet 0   omeprazole (PRILOSEC) 20 MG capsule Take 1 capsule (20 mg total) by mouth 2 (two) times daily. 60 capsule 0   torsemide (DEMADEX) 100 MG tablet Take 1 tablet (100 mg total) by mouth daily. 30 tablet 0   traZODone (DESYREL) 50 MG tablet Take 1 tablet (50 mg total) by mouth at bedtime. 30 tablet 0   vitamin D3 (CHOLECALCIFEROL) 25 MCG tablet Take 1 tablet (1,000 Units total) by mouth daily. 60 tablet 1   losartan (COZAAR) 100 MG tablet Take 1 tablet (100 mg total) by mouth at bedtime. 30 tablet 0   methylPREDNISolone (MEDROL) 4 MG tablet Take 1 tablet (4 mg total) by mouth daily. (Patient not taking: Reported on 08/26/2022) 1 tablet 0   sertraline (ZOLOFT) 25 MG tablet Take 1 tablet (25 mg total) by mouth daily. 30 tablet 0   Pertinent GI related medications were reviewed with the patient  Inpatient Medications  Current Facility-Administered Medications:    0.9 %  sodium chloride infusion (Manually program via Guardrails IV Fluids), , Intravenous, Once, Athena Masse, MD, Stopped at 08/28/22 1149   0.9 %  sodium chloride infusion (Manually program via Guardrails IV  Fluids), , Intravenous, Once, Athena Masse, MD   0.9 %  sodium chloride infusion (Manually program via Guardrails IV Fluids), , Intravenous, Once, Enzo Bi, MD   0.9 %  sodium chloride infusion, 10 mL/hr, Intravenous, Once, Nance Pear, MD   acetaminophen (TYLENOL) tablet 650 mg, 650 mg, Oral, Q6H PRN **OR** acetaminophen (TYLENOL) suppository 650 mg, 650 mg, Rectal, Q6H PRN, Athena Masse, MD   albuterol (PROVENTIL) (2.5 MG/3ML) 0.083% nebulizer solution 2.5 mg, 2.5 mg, Nebulization, Q2H PRN, Judd Gaudier V, MD   alteplase (CATHFLO ACTIVASE) injection 2 mg, 2 mg, Intracatheter, Once PRN, Colon Flattery, NP  anticoagulant sodium citrate solution 5 mL, 5 mL, Intracatheter, PRN, Colon Flattery, NP   aspirin EC tablet 81 mg, 81 mg, Oral, Daily, Judd Gaudier V, MD, 81 mg at 08/29/22 0858   atorvastatin (LIPITOR) tablet 80 mg, 80 mg, Oral, QPM, Athena Masse, MD, 80 mg at 08/28/22 1727   Chlorhexidine Gluconate Cloth 2 % PADS 6 each, 6 each, Topical, Q0600, Breeze, Shantelle, NP   cinacalcet (SENSIPAR) tablet 30 mg, 30 mg, Oral, Q breakfast, Judd Gaudier V, MD, 30 mg at 08/29/22 0859   epoetin alfa (EPOGEN) injection 4,000 Units, 4,000 Units, Intravenous, Q M,W,F-HD, Candiss Norse, [place], MD   gabapentin (NEURONTIN) capsule 300 mg, 300 mg, Oral, BID, Judd Gaudier V, MD, 300 mg at 08/29/22 0857   heparin injection 1,000 Units, 1,000 Units, Intracatheter, PRN, Colon Flattery, NP   HYDROcodone-acetaminophen (NORCO/VICODIN) 5-325 MG per tablet 1-2 tablet, 1-2 tablet, Oral, Q4H PRN, Athena Masse, MD   Ipratropium-Albuterol (COMBIVENT) respimat 1 puff, 1 puff, Inhalation, Q6H, Athena Masse, MD, 1 puff at 08/29/22 0858   levETIRAcetam (KEPPRA) tablet 1,000 mg, 1,000 mg, Oral, Daily, Judd Gaudier V, MD, 1,000 mg at 08/29/22 0857   levETIRAcetam (KEPPRA) tablet 250 mg, 250 mg, Oral, Once per day on Mon Wed Fri, Duncan, Hazel V, MD, 250 mg at 08/29/22 0859   lidocaine (PF) (XYLOCAINE)  1 % injection 5 mL, 5 mL, Intradermal, PRN, Breeze, Shantelle, NP   lidocaine-prilocaine (EMLA) cream 1 Application, 1 Application, Topical, PRN, Colon Flattery, NP   losartan (COZAAR) tablet 100 mg, 100 mg, Oral, QHS, Enzo Bi, MD, 100 mg at 08/28/22 2126   metoprolol succinate (TOPROL-XL) 24 hr tablet 100 mg, 100 mg, Oral, Daily, Judd Gaudier V, MD, 100 mg at 08/28/22 1032   pantoprazole (PROTONIX) EC tablet 40 mg, 40 mg, Oral, Daily, Judd Gaudier V, MD, 40 mg at 08/29/22 0857   pentafluoroprop-tetrafluoroeth (GEBAUERS) aerosol 1 Application, 1 Application, Topical, PRN, Colon Flattery, NP   polyethylene glycol-electrolytes (NuLYTELY) solution 2,000 mL, 2,000 mL, Oral, Once PRN, Annamaria Helling, DO   polyethylene glycol-electrolytes (NuLYTELY) solution 4,000 mL, 4,000 mL, Oral, Once, Annamaria Helling, DO   sertraline (ZOLOFT) tablet 25 mg, 25 mg, Oral, Daily, Enzo Bi, MD, 25 mg at 08/29/22 1349   torsemide (DEMADEX) tablet 100 mg, 100 mg, Oral, Daily, Judd Gaudier V, MD, 100 mg at 08/28/22 1033   traZODone (DESYREL) tablet 50 mg, 50 mg, Oral, QHS, Athena Masse, MD, 50 mg at 08/28/22 2126  sodium chloride     anticoagulant sodium citrate      acetaminophen **OR** acetaminophen, albuterol, alteplase, anticoagulant sodium citrate, heparin, HYDROcodone-acetaminophen, lidocaine (PF), lidocaine-prilocaine, pentafluoroprop-tetrafluoroeth, polyethylene glycol-electrolytes   Objective   Vitals:   08/29/22 1542 08/29/22 1547 08/29/22 1600 08/29/22 1602  BP: 139/64 (!) 151/67 (!) 153/80 (!) 150/68  Pulse: 64 64 65 64  Resp: (!) '23 20 15 16  '$ Temp:  97.7 F (36.5 C)  97.7 F (36.5 C)  TempSrc:  Oral  Oral  SpO2: 98% 100% 100% 100%  Weight:      Height:         Physical Exam Vitals and nursing note reviewed.  Constitutional:      General: She is not in acute distress.    Appearance: She is ill-appearing. She is not toxic-appearing or diaphoretic.  HENT:     Head:  Normocephalic and atraumatic.     Nose: Nose normal.     Mouth/Throat:     Mouth: Mucous membranes are  moist.     Pharynx: Oropharynx is clear.  Eyes:     General: No scleral icterus.    Extraocular Movements: Extraocular movements intact.  Cardiovascular:     Rate and Rhythm: Normal rate.  Pulmonary:     Effort: Pulmonary effort is normal. No respiratory distress.     Breath sounds: Normal breath sounds. No wheezing, rhonchi or rales.  Abdominal:     General: Bowel sounds are normal. There is no distension.     Palpations: Abdomen is soft.     Tenderness: There is no abdominal tenderness. There is no guarding or rebound.  Musculoskeletal:     Cervical back: Neck supple.  Skin:    General: Skin is warm and dry.     Coloration: Skin is not jaundiced or pale.  Neurological:     General: No focal deficit present.     Mental Status: She is alert and oriented to person, place, and time. Mental status is at baseline.  Psychiatric:        Mood and Affect: Mood normal.        Behavior: Behavior normal.        Thought Content: Thought content normal.        Judgment: Judgment normal.     Laboratory Data Recent Labs  Lab 08/26/22 1758 08/27/22 0031 08/28/22 0827 08/28/22 1103 08/29/22 0506  WBC 8.0  --   --   --  7.7  HGB 6.2*   < > 7.8* 7.3* 7.0*  HCT 20.8*  --  23.0* 23.3* 22.4*  PLT 220  --   --   --  159   < > = values in this interval not displayed.   Recent Labs  Lab 08/26/22 1758 08/28/22 0827 08/29/22 0506  NA 139 138 137  K 3.9 4.6 4.8  CL 103 103 102  CO2 27  --  21*  BUN 27* 38* 62*  CALCIUM 8.5*  --  8.2*  PROT 7.1  --   --   BILITOT 0.6  --   --   ALKPHOS 89  --   --   ALT 7  --   --   AST 13*  --   --   GLUCOSE 149* 103* 110*   No results for input(s): "INR" in the last 168 hours.  No results for input(s): "LIPASE" in the last 72 hours.      Imaging Studies: No results found.  Assessment:   Anemia/Black stool - suspect AOCD; no upper  source on egd. 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 Ok to continue after npo order Clear liquids today. Npo at midnight Holding eliquis Monitor H&H.  Transfusion and resuscitation as per primary team Avoid frequent lab draws to prevent lab induced anemia Supportive care and antiemetics as per primary team Maintain two sites IV access Avoid nsaids Monitor for GIB. Her significant other was updated in the room in person  Colonoscopy with  possible biopsy, control of bleeding, polypectomy, and interventions as necessary has been discussed with the patient/patient representative. Informed consent was obtained from the patient/patient representative after explaining the indication, nature, and risks of the procedure including but not limited to death, bleeding, perforation, missed neoplasm/lesions, cardiorespiratory compromise, and reaction to medications. Opportunity for questions was given and appropriate answers were provided. Patient/patient representative has verbalized understanding is amenable to undergoing the procedure.   I personally performed the service.  Management of other medical comorbidities as per primary team  Thank you for allowing [place] to participate in this patient's care. Please don't hesitate to call if any questions or concerns arise.   Annamaria Helling, DO [facility]  Portions of the record may have been created with voice recognition software. Occasional  wrong-word or 'sound-a-like' substitutions may have occurred due to the inherent limitations of voice recognition software.  Read the chart carefully and recognize, using context, where substitutions may have occurred.

## 2022-08-29 NOTE — Progress Notes (Signed)
[facility]  ROUNDING NOTE   Subjective:   Cynthia Dean s is a 72 y.o. 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 Patient presents to ED from dialysis with abnormal labs, hgb 5.7. She has been admitted under observation for Chronic blood loss anemia [D50.0] Gastrointestinal hemorrhage, unspecified gastrointestinal hemorrhage type [K92.2] Anemia, unspecified type [D64.9] Hemoglobin drop [R71.0]  Patient is known to our practice and receives outpatient dialysis treatments at [facility] on a MWF schedule, supervised by Dr. Holley Raring.    Patient seen laying in bed Alert and oriented Complains of stomach upset Remains on room air    Objective:  Vital signs in last 24 hours:  Temp:  [98.1 F (36.7 C)-99.1 F (37.3 C)] 98.7 F (37.1 C) (02/05 0750) Pulse Rate:  [64-70] 64 (02/05 0750) Resp:  [16-18] 18 (02/05 0750) BP: (147-158)/(68-77) 152/68 (02/05 0750) SpO2:  [93 %-100 %] 94 % (02/05 0750)  Weight change:  Filed Weights   08/26/22 2344  Weight: 104.1 kg    Intake/Output: I/O last 3 completed shifts: In: 320.1 [I.V.:320.1] Out: -    Intake/Output this shift:  No intake/output data recorded.  Physical Exam: General: NAD  Head: Normocephalic, atraumatic. Moist oral mucosal membranes  Eyes: Anicteric  Lungs:  Clear to auscultation, normal effort  Heart: Regular rate and rhythm  Abdomen:  Soft, nontender  Extremities:  Trace peripheral edema.  Neurologic: Nonfocal, moving all four extremities  Skin: No lesions  Access: Lt AVG    Basic Metabolic Panel: Recent Labs  Lab 08/26/22 1758 08/28/22 0827 08/29/22 0506  NA 139 138 137  K 3.9 4.6 4.8  CL 103 103 102  CO2 27  --  21*  GLUCOSE 149* 103* 110*  BUN 27* 38* 62*  CREATININE 5.48* 9.10* 9.05*  CALCIUM 8.5*  --  8.2*  MG  --   --  1.8     Liver  Function Tests: Recent Labs  Lab 08/26/22 1758  AST 13*  ALT 7  ALKPHOS 89  BILITOT 0.6  PROT 7.1  ALBUMIN 3.1*    No results for input(s): "LIPASE", "AMYLASE" in the last 168 hours. No results for input(s): "AMMONIA" in the last 168 hours.  CBC: Recent Labs  Lab 08/26/22 0930 08/26/22 1758 08/27/22 0031 08/27/22 0804 08/28/22 0827 08/28/22 1103 08/29/22 0506  WBC  --  8.0  --   --   --   --  7.7  HGB 5.7* 6.2* 6.2* 7.4* 7.8* 7.3* 7.0*  HCT 19.0* 20.8*  --   --  23.0* 23.3* 22.4*  MCV  --  85.2  --   --   --   --  84.2  PLT  --  220  --   --   --   --  159     Cardiac Enzymes: No results for input(s): "CKTOTAL", "CKMB", "CKMBINDEX", "TROPONINI" in the last 168 hours.  BNP: Invalid input(s): "POCBNP"  CBG: Recent Labs  Lab 08/28/22 1014 08/28/22 1213 08/28/22 1601 08/28/22 2035 08/29/22 0803  GLUCAP 99 111* 159* 117* 101*     Microbiology: Results for orders placed or performed during the hospital encounter of 08/26/22  MRSA Next Gen by PCR, Nasal     Status: None   Collection Time: 08/28/22  4:20 PM   Specimen: Nasal Mucosa; Nasal Swab  Result Value Ref Range Status   MRSA by PCR Next Gen NOT  DETECTED NOT DETECTED Final    Comment: (NOTE) The GeneXpert MRSA Assay (FDA approved for NASAL specimens only), is one component of a comprehensive MRSA colonization surveillance program. It is not intended to diagnose MRSA infection nor to guide or monitor treatment for MRSA infections. Test performance is not FDA approved in patients less than 17 years old. Performed at [facility], [address] [place], [place] [address]     Coagulation Studies: No results for input(s): "LABPROT", "INR" in the last 72 hours.  Urinalysis: No results for input(s): "COLORURINE", "LABSPEC", "PHURINE", "GLUCOSEU", "HGBUR", "BILIRUBINUR", "KETONESUR", "PROTEINUR", "UROBILINOGEN", "NITRITE", "LEUKOCYTESUR" in the last 72 hours.  Invalid input(s): "APPERANCEUR"     Imaging: No results found.   Medications:    sodium chloride     anticoagulant sodium citrate      sodium chloride   Intravenous Once   sodium chloride   Intravenous Once   sodium chloride   Intravenous Once   aspirin EC  81 mg Oral Daily   atorvastatin  80 mg Oral QPM   Chlorhexidine Gluconate Cloth  6 each Topical Q0600   cinacalcet  30 mg Oral Q breakfast   epoetin (EPOGEN/PROCRIT) injection  4,000 Units Intravenous Q M,W,F-HD   gabapentin  300 mg Oral BID   Ipratropium-Albuterol  1 puff Inhalation Q6H   levETIRAcetam  1,000 mg Oral Daily   levETIRAcetam  250 mg Oral Once per day on Mon Wed Fri   losartan  100 mg Oral QHS   metoprolol succinate  100 mg Oral Daily   pantoprazole  40 mg Oral Daily   sertraline  25 mg Oral Daily   torsemide  100 mg Oral Daily   traZODone  50 mg Oral QHS   acetaminophen **OR** acetaminophen, albuterol, alteplase, anticoagulant sodium citrate, heparin, HYDROcodone-acetaminophen, lidocaine (PF), lidocaine-prilocaine, pentafluoroprop-tetrafluoroeth  Assessment/ Plan:  [name] is a 72 y.o.  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 Patient presents to ED from dialysis with abnormal labs, hgb 5.7. She has been admitted under observation for Chronic blood loss anemia [D50.0] Gastrointestinal hemorrhage, unspecified gastrointestinal hemorrhage type [K92.2] Anemia, unspecified type [D64.9] Hemoglobin drop [R71.0]  [facility] [place]/MWF/Lt AVG   1. End stage renal disease on hemodialysis. Scheduled for dialysis later today, low UF 0.5L as tolerated. Next treatment scheduled for Wednesday.    2. Anemia of chronic kidney disease with acute blood loss Lab Results  Component Value Date   HGB 7.0 (L) 08/29/2022   Hgb on admission 5.7 with positive guaiac. Patient has received 2 unit blood  transfusion. GI consulted and EGD on 08/28/22, no signs of active bleeding. Plans to proceed with colonoscopy.    3. Hypertension with chronic kidney disease. Home regimen includes losartan, metoprolol, and torsemide. Currently receiving these medications.  Blood pressure 152/68, acceptable.   4. Secondary Hyperparathyroidism: with outpatient labs: PTH 613, phosphorus 4.8, calcium 9.0 on 03/07/22.    :  Lab Results  Component Value Date   PTH 131 (H) 03/14/2018   CALCIUM 8.2 (L) 08/29/2022   CAION 1.09 (L) 08/28/2022   PHOS 6.5 (H) 05/11/2022    Calcium acceptable at this time. Awaiting up dated phosphorus. Continue cinacalcet.    LOS: 1   2/5/20242:23 PM

## 2022-08-29 NOTE — Progress Notes (Signed)
1945- patient brought back from dialysis by transport on bed. Alert and oriented, dressing to fistula intact. Made comfortable, v/s done

## 2022-08-29 NOTE — Progress Notes (Incomplete)
PROGRESS NOTE    Kanise Glace  ZOX:[id number] DOB: 30-Jan-1951 DOA: 08/26/2022 PCP: Loistine Chance, MD  114A/114A-AA  LOS: 2 days   Brief hospital course:   Assessment & Plan: Cynthia Dean is a 72 y.o. female with medical history significant for ESRD on HD MWF, insulin requiring T2DM, COPD on home 4 L home O2, CAD, carotid stenosis, seizure disorder, HTN, prior CVA, GERD, GI bleeding 2020 with unremarkable EGD and colonoscopy, left breast ca s/p partial mastectomy and left axillary dissection on 03/04/2022 complicated by left breast abscess with coag negative staph bacteremia as well as small bilateral PE 03/20/2022, currently on Eliquis, who was sent from dialysis to the ED for evaluation of hemoglobin of 5.7, down from baseline of around 7.6-8.3. Rectal exam done in the ED showed brown stool that was guaiac positive.    Acute on chronic blood loss anemia Anemia of chronic kidney disease Chronic anticoagulation with Eliquis History of rectal bleeding 01/2019 with unremarkable EGD and colonoscopy --baseline hemoglobin around 8.0 --Patient with hemoglobin of 5.7 and dialysis and positive guaiac in the ED. --Reports of occasional dark stool stool and symptomatic for fatigue -- s/p 2u pRBC --EGD, unremarkable Plan: --Holding Eliquis (taking for PE since August 2023) --1u pRBC ordered to be given with dialysis for Hgb 7.0 this morning. --plan for colonoscopy on Tuesday (can not perform on Monday due to scheduled dialysis on Mon)  Chronic anticoagulation History of PE 03/20/2022 S/p partial mastectomy for left breast cancer 03/04/2022 --Patient on chronic anticoagulation with Eliquis for PE developed following her partial mastectomy for breast cancer --Hold Eliquis in the setting of suspected GI bleeding   Chronic diastolic CHF (congestive heart failure) (HCC) --Clinically euvolemic --cont home torsemide   ESRD on hemodialysis (HCC) --iHD per home schedule   CAD  (coronary artery disease) --Continue aspirin and atorvastatin   Essential hypertension --Continue metoprolol and torsemide --cont home losartan nightly   COPD (chronic obstructive pulmonary disease) (HCC) Chronic respiratory failure --Not currently exacerbated --Continue home inhalers and baseline oxygen   Depression Continue sertraline and trazodone   Seizure disorder (HCC) --Continue Keppra   Breast cancer, left S/P partial mastectomy 03/04/2022 --Not followed by oncology.  No oncology follow up scheduled currently - her oncology team reached out for follow up but family declined. Patient's lumpectomy was complicated by abscess and staph bacteremia Patient says she does not think she wants to pursue any further treatment   Insulin-requiring or dependent type II diabetes mellitus (HCC) --recent A1c 7.0 --BG checks without SSI since BG have all been <150 and pt undergoing GI procedure and preps.   History of CVA (cerebrovascular accident) --Continue aspirin and atorvastatin    DVT prophylaxis: SCD/Compression stockings Code Status: Full code  Family Communication:  Level of care: Med-Surg Dispo:   The patient is from: home Anticipated d/c is to: home Anticipated d/c date is: Tuesday   Subjective and Interval History:  Pt reported multiple episodes of diarrhea, however, per nursing, pt just had little amount of stool, more like a smear.  C diff test was not collected.  Hgb dropped to 7 this morning.  1u pRBC ordered to be given with dialysis.   Objective: Vitals:   08/30/22 0443 08/30/22 0804 08/30/22 1607 08/30/22 2133  BP: (!) 156/63 (!) 131/57 (!) 153/67 (!) 159/71  Pulse: 72 69 67 70  Resp: 20 16  19   Temp: 98.9 F (37.2 C) 99.1 F (37.3 C) 98.9 F (37.2 C) 99.3 F (37.4 C)  TempSrc:      SpO2: 90% 91% 98% 96%  Weight:      Height:        Intake/Output Summary (Last 24 hours) at 08/30/2022 2140 Last data filed at 08/29/2022 2200 Gross per 24 hour   Intake 200 ml  Output --  Net 200 ml   Filed Weights   08/26/22 2344 08/29/22 1510 08/29/22 1928  Weight: 104.1 kg 106.5 kg 105.1 kg    Examination:   Constitutional: NAD, AAOx3 HEENT: conjunctivae and lids normal, EOMI CV: No cyanosis.   RESP: normal respiratory effort, on RA Neuro: II - XII grossly intact.   Psych: Normal mood and affect.  Appropriate judgement and reason   Data Reviewed: I have personally reviewed labs and imaging studies  Time spent: 35 minutes  Darlin Priestly, MD [facility] If 7PM-7AM, please contact night-coverage 08/30/2022, 9:40 PM

## 2022-08-30 ENCOUNTER — Encounter
Admission: EM | Disposition: A | Discharge status: Skilled Nursing Facility | Payer: Self-pay | Source: Home / Self Care | Attending: Osteopathic Medicine

## 2022-08-30 DIAGNOSIS — D62 Acute posthemorrhagic anemia: Secondary | ICD-10-CM | POA: Diagnosis not present

## 2022-08-30 LAB — TYPE AND SCREEN
ABO/RH(D): A POS
Antibody Screen: NEGATIVE
Unit division: 0
Unit division: 0
Unit division: 0

## 2022-08-30 LAB — BASIC METABOLIC PANEL
Anion gap: 12 (ref 5–15)
BUN: 35 mg/dL — ABNORMAL HIGH (ref 8–23)
CO2: 27 mmol/L (ref 22–32)
Calcium: 7.8 mg/dL — ABNORMAL LOW (ref 8.9–10.3)
Chloride: 98 mmol/L (ref 98–111)
Creatinine, Ser: 6.03 mg/dL — ABNORMAL HIGH (ref 0.44–1.00)
GFR, Estimated: 7 mL/min — ABNORMAL LOW (ref >60)
Glucose, Bld: 149 mg/dL — ABNORMAL HIGH (ref 70–99)
Potassium: 3.8 mmol/L (ref 3.5–5.1)
Sodium: 137 mmol/L (ref 135–145)

## 2022-08-30 LAB — GASTROINTESTINAL PANEL BY PCR, STOOL (REPLACES STOOL CULTURE)

## 2022-08-30 LAB — CBC
HCT: 24.1 % — ABNORMAL LOW (ref 36.0–46.0)
Hemoglobin: 7.7 g/dL — ABNORMAL LOW (ref 12.0–15.0)
MCH: 26.9 pg (ref 26.0–34.0)
MCHC: 32 g/dL (ref 30.0–36.0)
MCV: 84.3 fL (ref 80.0–100.0)
Platelets: 156 10*3/uL (ref 150–400)
RBC: 2.86 MIL/uL — ABNORMAL LOW (ref 3.87–5.11)
RDW: 16.7 % — ABNORMAL HIGH (ref 11.5–15.5)
WBC: 9.4 10*3/uL (ref 4.0–10.5)
nRBC: 0 % (ref 0.0–0.2)

## 2022-08-30 LAB — BPAM RBC
Blood Product Expiration Date: 202403062359
Blood Product Expiration Date: 202403062359
Blood Product Expiration Date: 202403062359
ISSUE DATE / TIME: 202402022014
ISSUE DATE / TIME: 202402030312
ISSUE DATE / TIME: 202402051501
Unit Type and Rh: 6200
Unit Type and Rh: 6200
Unit Type and Rh: 6200

## 2022-08-30 LAB — GLUCOSE, CAPILLARY
Glucose-Capillary: 141 mg/dL — ABNORMAL HIGH (ref 70–99)
Glucose-Capillary: 134 mg/dL — ABNORMAL HIGH (ref 70–99)
Glucose-Capillary: 139 mg/dL — ABNORMAL HIGH (ref 70–99)
Glucose-Capillary: 146 mg/dL — ABNORMAL HIGH (ref 70–99)

## 2022-08-30 LAB — MAGNESIUM: Magnesium: 1.8 mg/dL (ref 1.7–2.4)

## 2022-08-30 LAB — HEPATITIS B E ANTIBODY: Hep B E Ab: NEGATIVE

## 2022-08-30 SURGERY — COLONOSCOPY WITH PROPOFOL
Anesthesia: General

## 2022-08-30 MED ORDER — POLYETHYLENE GLYCOL 3350 17 G PO PACK
34.0000 g | PACK | ORAL | Status: AC
  Administered 2022-08-30 (×5): 34 g via ORAL
  Filled 2022-08-30 (×5): qty 2

## 2022-08-30 NOTE — Progress Notes (Signed)
Inpatient progress note Patient ID: Cynthia Dean is a 72 y.o. female.  Interval hx Pt seen in her room. Only drank 1/2 cup of the prep o/n of the total gallon. Colonoscopy canceled today. She is quite sleepy, but easily arousable and interactive  Pt with diarrhea. Dark stools per nursing. Infectious w/u pending Pt seen on HD. No abdominal pain/n/v. Plan for colonoscopy tmrw No other acute gi complaints.  Past Medical History:  Diagnosis Date   Acute on chronic respiratory failure with hypoxia ([place]) 07/30/2019   Acute respiratory failure with hypoxia ([place]) 12/31/2018   Carotid arterial disease ([place])    a. 02/2018 < bilat ICA stenosis.   COPD (chronic obstructive pulmonary disease) (HCC)    Coronary artery disease    a. 11/2015 MV: EF 57%, no ischemia/infarct.   Cryptogenic stroke ([place])    a. 10/2017 s/p implantable loop recorder (no Afib to date).   Diabetes mellitus without complication ([place])    Diarrhea    ESRD on dialysis [facility])    GI bleed    a. 01/2019 EGD/Colonoscopy: Mild gastritis. Colitis.   Heart murmur    a. 12/2018 Echo: EF 55-60%, impaired relaxation. Mod dil LA. Mildly dil RA. Mild Ca2+ of AoV.   Hematemesis 06/27/2017   History of 2019 novel coronavirus disease (COVID-19) 07/30/2019   Hyperkalemia    Hyperlipidemia    Hypertension    Intractable nausea and vomiting 08/08/2015   Nausea & vomiting 05/30/2018   Nausea and vomiting 05/29/2018   Pneumonia due to COVID-19 virus     Past Surgical History:  Procedure Laterality Date   A/V FISTULAGRAM Left 12/20/2016   Procedure: A/V Fistulagram;  Surgeon: Katha Cabal, MD;  Location: [place] [facility];  Service: Cardiovascular;  Laterality: Left;   A/V SHUNT INTERVENTION N/A 12/20/2016   Procedure: A/V Shunt Intervention;  Surgeon: Katha Cabal, MD;  Location: [place] [facility];  Service: Cardiovascular;  Laterality: N/A;   A/V SHUNTOGRAM Left 09/11/2017   Procedure: A/V SHUNTOGRAM;  Surgeon:  Algernon Huxley, MD;  Location: [place] [facility];  Service: Cardiovascular;  Laterality: Left;   A/V SHUNTOGRAM Left 11/21/2019   Procedure: A/V SHUNTOGRAM;  Surgeon: Algernon Huxley, MD;  Location: [place] [facility];  Service: Cardiovascular;  Laterality: Left;   BREAST BIOPSY Bilateral 07/19/2000   neg   BREAST BIOPSY Left 11/03/2021   [place] bx mass at 3:00, venus marker, axilla bx-hyrdo marker, path pending   COLONOSCOPY WITH PROPOFOL N/A 02/16/2019   Procedure: COLONOSCOPY WITH PROPOFOL;  Surgeon: Toledo, Benay Pike, MD;  Location: [facility];  Service: Gastroenterology;  Laterality: N/A;   ESOPHAGOGASTRODUODENOSCOPY (EGD) WITH PROPOFOL N/A 02/16/2019   Procedure: ESOPHAGOGASTRODUODENOSCOPY (EGD) WITH PROPOFOL;  Surgeon: Toledo, Benay Pike, MD;  Location: [facility];  Service: Gastroenterology;  Laterality: N/A;   ESOPHAGOGASTRODUODENOSCOPY (EGD) WITH PROPOFOL N/A 08/28/2022   Procedure: ESOPHAGOGASTRODUODENOSCOPY (EGD) WITH PROPOFOL;  Surgeon: Annamaria Helling, DO;  Location: [place];  Service: Gastroenterology;  Laterality: N/A;   IR US GUIDE BX ASP/DRAIN  03/25/2022   LOOP RECORDER INSERTION N/A 11/16/2017   Procedure: LOOP RECORDER INSERTION;  Surgeon: Deboraha Sprang, MD;  Location: [place] [facility];  Service: Cardiovascular;  Laterality: N/A;   PERIPHERAL VASCULAR CATHETERIZATION Left 02/02/2015   Procedure: A/V Shuntogram/Fistulagram;  Surgeon: Algernon Huxley, MD;  Location: [place] [facility];  Service: Cardiovascular;  Laterality: Left;   PERIPHERAL VASCULAR CATHETERIZATION Left 02/02/2015   Procedure: A/V Shunt Intervention;  Surgeon: Algernon Huxley, MD;  Location: [place] [facility];  Service: Cardiovascular;  Laterality: Left;   PERIPHERAL VASCULAR CATHETERIZATION Left 03/09/2015   Procedure: A/V Shuntogram/Fistulagram;  Surgeon: Algernon Huxley, MD;  Location: [place] [facility];  Service: Cardiovascular;  Laterality: Left;   PERIPHERAL VASCULAR CATHETERIZATION N/A  03/09/2015   Procedure: A/V Shunt Intervention;  Surgeon: Algernon Huxley, MD;  Location: [place] [facility];  Service: Cardiovascular;  Laterality: N/A;   PERIPHERAL VASCULAR CATHETERIZATION Left 04/17/2015   Procedure: Upper Extremity Angiography;  Surgeon: Algernon Huxley, MD;  Location: [place] [facility];  Service: Cardiovascular;  Laterality: Left;   PERIPHERAL VASCULAR CATHETERIZATION  04/17/2015   Procedure: Upper Extremity Intervention;  Surgeon: Algernon Huxley, MD;  Location: [place] [facility];  Service: Cardiovascular;;   PERIPHERAL VASCULAR CATHETERIZATION N/A 08/10/2015   Procedure: A/V Shuntogram/Fistulagram;  Surgeon: Algernon Huxley, MD;  Location: [place] [facility];  Service: Cardiovascular;  Laterality: N/A;   PERIPHERAL VASCULAR CATHETERIZATION N/A 08/10/2015   Procedure: A/V Shunt Intervention;  Surgeon: Algernon Huxley, MD;  Location: [place] [facility];  Service: Cardiovascular;  Laterality: N/A;   PERIPHERAL VASCULAR CATHETERIZATION Left 04/11/2016   Procedure: A/V Shuntogram/Fistulagram;  Surgeon: Algernon Huxley, MD;  Location: [place] [facility];  Service: Cardiovascular;  Laterality: Left;   TEE WITHOUT CARDIOVERSION N/A 11/16/2017   Procedure: TRANSESOPHAGEAL ECHOCARDIOGRAM (TEE);  Surgeon: Minna Merritts, MD;  Location: [facility];  Service: Cardiovascular;  Laterality: N/A;    Allergies  Allergen Reactions   Oxycodone Nausea And Vomiting   Oxycodone-Acetaminophen Other (See Comments)   Wound Dressing Adhesive    Hydrocodone     Intolerant more than allergic   Tape Itching    Skin Dermatitis/itching (tape adhesive) Skin Dermatitis/itching (tape adhesive)   Tapentadol Itching    Skin Dermatitis/itching (tape adhesive) Skin Dermatitis/itching (tape adhesive)     Family History  Problem Relation Age of Onset   Breast cancer Father 63   Stroke Mother    Heart attack Mother    Heart Problems Sister    Breast cancer Cousin 30       1 st cousin. Maternal      Social History   Tobacco Use   Smoking status: Former    Packs/day: 0.00    Types: Cigarettes   Smokeless tobacco: Never  Vaping Use   Vaping Use: Never used  Substance Use Topics   Alcohol use: No   Drug use: No     Pertinent GI related history and allergies were reviewed with the patient  Review of Systems  Constitutional:  Negative for activity change, appetite change, chills, diaphoresis, fatigue, fever and unexpected weight change.  HENT:  Negative for trouble swallowing and voice change.   Respiratory:  Negative for shortness of breath and wheezing.   Cardiovascular:  Negative for chest pain, palpitations and leg swelling.  Gastrointestinal:  Positive for blood in stool (dark stools) and diarrhea. Negative for abdominal distention, abdominal pain, anal bleeding, constipation, nausea, rectal pain and vomiting.  Skin:  Negative for color change and pallor.  Neurological:  Negative for dizziness, syncope and weakness.  Psychiatric/Behavioral:  Negative for confusion.   All other systems reviewed and are negative.    Medications Home Medications No current facility-administered medications on file prior to encounter.   Current Outpatient Medications on File Prior to Encounter  Medication Sig Dispense Refill   acetaminophen (TYLENOL) 500 MG tablet Take 1 tablet (500 mg total)  by mouth every 6 (six) hours as needed. 30 tablet 1   apixaban (ELIQUIS) 5 MG TABS tablet Take 1 tablet (5 mg total) by mouth 2 (two) times daily. 60 tablet 0   aspirin EC 81 MG tablet Take 1 tablet (81 mg total) by mouth daily. Swallow whole. 30 tablet 1   atorvastatin (LIPITOR) 80 MG tablet Take 1 tablet (80 mg total) by mouth every evening. 30 tablet 0   cinacalcet (SENSIPAR) 30 MG tablet Take 1 tablet (30 mg total) by mouth daily with breakfast. 60 tablet 1   diphenoxylate-atropine (LOMOTIL) 2.5-0.025 MG tablet SMARTSIG:1 Tablet(s) By Mouth Every 12 Hours PRN 30 tablet 1   fluticasone (FLONASE)  50 MCG/ACT nasal spray Place 1 spray into both nostrils daily. 1 g 0   gabapentin (NEURONTIN) 300 MG capsule Take 1 capsule (300 mg total) by mouth 2 (two) times daily. 60 capsule 0   glucose 4 GM chewable tablet Chew 1 tablet (4 g total) by mouth as needed for low blood sugar. 50 tablet 1   insulin NPH-regular Human (NOVOLIN 70/30) (70-30) 100 UNIT/ML injection Inject 28 Units into the skin 2 (two) times daily with a meal. 10 mL 1   Ipratropium-Albuterol (COMBIVENT) 20-100 MCG/ACT AERS respimat Inhale 1 puff into the lungs every 6 (six) hours. 4 g 0   levETIRAcetam (KEPPRA) 1000 MG tablet Take 1 tablet (1,000 mg total) by mouth daily. 30 tablet 0   levETIRAcetam (KEPPRA) 250 MG tablet Take 1 tablet (250 mg total) by mouth 3 (three) times a week. 12 tablet 0   lidocaine (LIDODERM) 5 % Place 1 patch onto the skin daily. Remove & Discard patch within 12 hours or as directed by MD 30 patch 0   metoprolol succinate (TOPROL-XL) 100 MG 24 hr tablet Take 1 tablet (100 mg total) by mouth daily. 30 tablet 0   omeprazole (PRILOSEC) 20 MG capsule Take 1 capsule (20 mg total) by mouth 2 (two) times daily. 60 capsule 0   torsemide (DEMADEX) 100 MG tablet Take 1 tablet (100 mg total) by mouth daily. 30 tablet 0   traZODone (DESYREL) 50 MG tablet Take 1 tablet (50 mg total) by mouth at bedtime. 30 tablet 0   vitamin D3 (CHOLECALCIFEROL) 25 MCG tablet Take 1 tablet (1,000 Units total) by mouth daily. 60 tablet 1   losartan (COZAAR) 100 MG tablet Take 1 tablet (100 mg total) by mouth at bedtime. 30 tablet 0   methylPREDNISolone (MEDROL) 4 MG tablet Take 1 tablet (4 mg total) by mouth daily. (Patient not taking: Reported on 08/26/2022) 1 tablet 0   sertraline (ZOLOFT) 25 MG tablet Take 1 tablet (25 mg total) by mouth daily. 30 tablet 0   Pertinent GI related medications were reviewed with the patient  Inpatient Medications  Current Facility-Administered Medications:    0.9 %  sodium chloride infusion (Manually  program via Guardrails IV Fluids), , Intravenous, Once, Athena Masse, MD, Stopped at 08/28/22 1149   0.9 %  sodium chloride infusion (Manually program via Guardrails IV Fluids), , Intravenous, Once, Judd Gaudier V, MD   0.9 %  sodium chloride infusion (Manually program via Guardrails IV Fluids), , Intravenous, Once, Enzo Bi, MD   0.9 %  sodium chloride infusion, 10 mL/hr, Intravenous, Once, Nance Pear, MD   acetaminophen (TYLENOL) tablet 650 mg, 650 mg, Oral, Q6H PRN, 650 mg at 08/30/22 0447 **OR** acetaminophen (TYLENOL) suppository 650 mg, 650 mg, Rectal, Q6H PRN, Athena Masse, MD   albuterol (  PROVENTIL) (2.5 MG/3ML) 0.083% nebulizer solution 2.5 mg, 2.5 mg, Nebulization, Q2H PRN, Athena Masse, MD   alteplase (CATHFLO ACTIVASE) injection 2 mg, 2 mg, Intracatheter, Once PRN, Colon Flattery, NP   anticoagulant sodium citrate solution 5 mL, 5 mL, Intracatheter, PRN, Colon Flattery, NP   aspirin EC tablet 81 mg, 81 mg, Oral, Daily, Judd Gaudier V, MD, 81 mg at 08/30/22 1004   atorvastatin (LIPITOR) tablet 80 mg, 80 mg, Oral, QPM, Athena Masse, MD, 80 mg at 08/29/22 2022   Chlorhexidine Gluconate Cloth 2 % PADS 6 each, 6 each, Topical, Q0600, Colon Flattery, NP, 6 each at 08/30/22 0603   cinacalcet (SENSIPAR) tablet 30 mg, 30 mg, Oral, Q breakfast, Judd Gaudier V, MD, 30 mg at 08/30/22 1006   epoetin alfa (EPOGEN) injection 4,000 Units, 4,000 Units, Intravenous, Q M,W,F-HD, Murlean Iba, MD, 4,000 Units at 08/29/22 1905   gabapentin (NEURONTIN) capsule 300 mg, 300 mg, Oral, BID, Judd Gaudier V, MD, 300 mg at 08/30/22 1004   heparin injection 1,000 Units, 1,000 Units, Intracatheter, PRN, Colon Flattery, NP   HYDROcodone-acetaminophen (NORCO/VICODIN) 5-325 MG per tablet 1-2 tablet, 1-2 tablet, Oral, Q4H PRN, Athena Masse, MD, 1 tablet at 08/29/22 2022   Ipratropium-Albuterol (COMBIVENT) respimat 1 puff, 1 puff, Inhalation, Q6H, Athena Masse, MD, 1 puff at  08/30/22 1005   levETIRAcetam (KEPPRA) tablet 1,000 mg, 1,000 mg, Oral, Daily, Judd Gaudier V, MD, 1,000 mg at 08/30/22 1004   levETIRAcetam (KEPPRA) tablet 250 mg, 250 mg, Oral, Once per day on Mon Wed Fri, Duncan, Hazel V, MD, 250 mg at 08/29/22 0859   lidocaine (PF) (XYLOCAINE) 1 % injection 5 mL, 5 mL, Intradermal, PRN, Gwyneth Revels, Shantelle, NP   lidocaine-prilocaine (EMLA) cream 1 Application, 1 Application, Topical, PRN, Colon Flattery, NP   losartan (COZAAR) tablet 100 mg, 100 mg, Oral, QHS, Enzo Bi, MD, 100 mg at 08/29/22 2246   metoprolol succinate (TOPROL-XL) 24 hr tablet 100 mg, 100 mg, Oral, Daily, Judd Gaudier V, MD, 100 mg at 08/30/22 1005   pantoprazole (PROTONIX) EC tablet 40 mg, 40 mg, Oral, Daily, Judd Gaudier V, MD, 40 mg at 08/30/22 1004   pentafluoroprop-tetrafluoroeth (GEBAUERS) aerosol 1 Application, 1 Application, Topical, PRN, Breeze, Benancio Deeds, NP   polyethylene glycol (MIRALAX / GLYCOLAX) packet 34 g, 34 g, Oral, Q2H, Enzo Bi, MD, 34 g at 08/30/22 1208   polyethylene glycol-electrolytes (NuLYTELY) solution 2,000 mL, 2,000 mL, Oral, Once PRN, Annamaria Helling, DO   sertraline (ZOLOFT) tablet 25 mg, 25 mg, Oral, Daily, Enzo Bi, MD, 25 mg at 08/30/22 1005   torsemide (DEMADEX) tablet 100 mg, 100 mg, Oral, Daily, Judd Gaudier V, MD, 100 mg at 08/30/22 1004   traZODone (DESYREL) tablet 50 mg, 50 mg, Oral, QHS, Athena Masse, MD, 50 mg at 08/29/22 2246  sodium chloride     anticoagulant sodium citrate      acetaminophen **OR** acetaminophen, albuterol, alteplase, anticoagulant sodium citrate, heparin, HYDROcodone-acetaminophen, lidocaine (PF), lidocaine-prilocaine, pentafluoroprop-tetrafluoroeth, polyethylene glycol-electrolytes   Objective   Vitals:   08/29/22 1928 08/29/22 2000 08/30/22 0443 08/30/22 0804  BP: (!) 175/66 (!) 168/66 (!) 156/63 (!) 131/57  Pulse: 65 64 72 69  Resp: '20 18 20 16  '$ Temp: 98.4 F (36.9 C) 98.4 F (36.9 C) 98.9 F (37.2 C)  99.1 F (37.3 C)  TempSrc: Oral Oral    SpO2: 93% 98% 90% 91%  Weight: 105.1 kg     Height:         Physical  Exam Vitals and nursing note reviewed.  Constitutional:      General: She is not in acute distress.    Appearance: She is not ill-appearing, toxic-appearing or diaphoretic.     Comments: Lethargic today  HENT:     Head: Normocephalic and atraumatic.     Nose: Nose normal.     Mouth/Throat:     Mouth: Mucous membranes are moist.     Pharynx: Oropharynx is clear.  Eyes:     General: No scleral icterus.    Extraocular Movements: Extraocular movements intact.  Cardiovascular:     Rate and Rhythm: Normal rate.  Pulmonary:     Effort: Pulmonary effort is normal. No respiratory distress.     Breath sounds: Normal breath sounds. No wheezing, rhonchi or rales.  Abdominal:     General: Bowel sounds are normal. There is no distension.     Palpations: Abdomen is soft.     Tenderness: There is no abdominal tenderness. There is no guarding or rebound.  Musculoskeletal:     Cervical back: Neck supple.  Skin:    General: Skin is warm and dry.     Coloration: Skin is not jaundiced or pale.  Neurological:     General: No focal deficit present.     Mental Status: She is alert and oriented to person, place, and time. Mental status is at baseline.  Psychiatric:        Mood and Affect: Mood normal.        Behavior: Behavior normal.        Thought Content: Thought content normal.        Judgment: Judgment normal.     Laboratory Data Recent Labs  Lab 08/26/22 1758 08/27/22 0031 08/28/22 1103 08/29/22 0506 08/30/22 0456  WBC 8.0  --   --  7.7 9.4  HGB 6.2*   < > 7.3* 7.0* 7.7*  HCT 20.8*   < > 23.3* 22.4* 24.1*  PLT 220  --   --  159 156   < > = values in this interval not displayed.    Recent Labs  Lab 08/26/22 1758 08/28/22 0827 08/29/22 0506 08/30/22 0456  NA 139 138 137 137  K 3.9 4.6 4.8 3.8  CL 103 103 102 98  CO2 27  --  21* 27  BUN 27* 38* 62* 35*   CALCIUM 8.5*  --  8.2* 7.8*  PROT 7.1  --   --   --   BILITOT 0.6  --   --   --   ALKPHOS 89  --   --   --   ALT 7  --   --   --   AST 13*  --   --   --   GLUCOSE 149* 103* 110* 149*    No results for input(s): "INR" in the last 168 hours.  No results for input(s): "LIPASE" in the last 72 hours.      Imaging Studies: No results found.  Assessment:   Anemia/Black stool - suspect AOCD; no upper source on egd. 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 CSY canceled today Plan for  colonoscopy 08/31/22 after prep completed Hgb improved to 7.6 with 1 u prbc (from 7) Pt with dark stools per nursing infectious stool w/u pending  Clear liquids today. Npo at midnight Ok to continue golytely/miralax after npo order  Holding eliquis Monitor H&H.  Transfusion and resuscitation as per primary team Avoid frequent lab draws to prevent lab induced anemia Supportive care and antiemetics as per primary team Maintain two sites IV access Avoid nsaids Monitor for GIB.  Colonoscopy with possible biopsy, control of bleeding, polypectomy, and interventions as necessary has been discussed with the patient/patient representative. Informed consent was obtained from the patient/patient representative after explaining the indication, nature, and risks of the procedure including but not limited to death, bleeding, perforation, missed neoplasm/lesions, cardiorespiratory compromise, and reaction to medications. Opportunity for questions was given and appropriate answers were provided. Patient/patient representative has verbalized understanding is amenable to undergoing the procedure.   I personally performed the  service.  Management of other medical comorbidities as per primary team  Thank you for allowing [place] to participate in this patient's care. Please don't hesitate to call if any questions or concerns arise.   Annamaria Helling, DO [facility]  Portions of the record may have been created with voice recognition software. Occasional wrong-word or 'sound-a-like' substitutions may have occurred due to the inherent limitations of voice recognition software.  Read the chart carefully and recognize, using context, where substitutions may have occurred.

## 2022-08-30 NOTE — H&P (View-Only) (Signed)
Inpatient progress note Patient ID: Cynthia Dean is a 72 y.o. female.  Interval hx Pt seen in her room. Only drank 1/2 cup of the prep o/n of the total gallon. Colonoscopy canceled today. She is quite sleepy, but easily arousable and interactive  Pt with diarrhea. Dark stools per nursing. Infectious w/u pending Pt seen on HD. No abdominal pain/n/v. Plan for colonoscopy tmrw No other acute gi complaints.  Past Medical History:  Diagnosis Date   Acute on chronic respiratory failure with hypoxia ([place]) 07/30/2019   Acute respiratory failure with hypoxia ([place]) 12/31/2018   Carotid arterial disease ([place])    a. 02/2018 < bilat ICA stenosis.   COPD (chronic obstructive pulmonary disease) (HCC)    Coronary artery disease    a. 11/2015 MV: EF 57%, no ischemia/infarct.   Cryptogenic stroke ([place])    a. 10/2017 s/p implantable loop recorder (no Afib to date).   Diabetes mellitus without complication ([place])    Diarrhea    ESRD on dialysis [facility])    GI bleed    a. 01/2019 EGD/Colonoscopy: Mild gastritis. Colitis.   Heart murmur    a. 12/2018 Echo: EF 55-60%, impaired relaxation. Mod dil LA. Mildly dil RA. Mild Ca2+ of AoV.   Hematemesis 06/27/2017   History of 2019 novel coronavirus disease (COVID-19) 07/30/2019   Hyperkalemia    Hyperlipidemia    Hypertension    Intractable nausea and vomiting 08/08/2015   Nausea & vomiting 05/30/2018   Nausea and vomiting 05/29/2018   Pneumonia due to COVID-19 virus     Past Surgical History:  Procedure Laterality Date   A/V FISTULAGRAM Left 12/20/2016   Procedure: A/V Fistulagram;  Surgeon: Katha Cabal, MD;  Location: [place] [facility];  Service: Cardiovascular;  Laterality: Left;   A/V SHUNT INTERVENTION N/A 12/20/2016   Procedure: A/V Shunt Intervention;  Surgeon: Katha Cabal, MD;  Location: [place] [facility];  Service: Cardiovascular;  Laterality: N/A;   A/V SHUNTOGRAM Left 09/11/2017   Procedure: A/V SHUNTOGRAM;  Surgeon:  Algernon Huxley, MD;  Location: [place] [facility];  Service: Cardiovascular;  Laterality: Left;   A/V SHUNTOGRAM Left 11/21/2019   Procedure: A/V SHUNTOGRAM;  Surgeon: Algernon Huxley, MD;  Location: [place] [facility];  Service: Cardiovascular;  Laterality: Left;   BREAST BIOPSY Bilateral 07/19/2000   neg   BREAST BIOPSY Left 11/03/2021   [place] bx mass at 3:00, venus marker, axilla bx-hyrdo marker, path pending   COLONOSCOPY WITH PROPOFOL N/A 02/16/2019   Procedure: COLONOSCOPY WITH PROPOFOL;  Surgeon: Toledo, Benay Pike, MD;  Location: [facility];  Service: Gastroenterology;  Laterality: N/A;   ESOPHAGOGASTRODUODENOSCOPY (EGD) WITH PROPOFOL N/A 02/16/2019   Procedure: ESOPHAGOGASTRODUODENOSCOPY (EGD) WITH PROPOFOL;  Surgeon: Toledo, Benay Pike, MD;  Location: [facility];  Service: Gastroenterology;  Laterality: N/A;   ESOPHAGOGASTRODUODENOSCOPY (EGD) WITH PROPOFOL N/A 08/28/2022   Procedure: ESOPHAGOGASTRODUODENOSCOPY (EGD) WITH PROPOFOL;  Surgeon: Annamaria Helling, DO;  Location: [place];  Service: Gastroenterology;  Laterality: N/A;   IR US GUIDE BX ASP/DRAIN  03/25/2022   LOOP RECORDER INSERTION N/A 11/16/2017   Procedure: LOOP RECORDER INSERTION;  Surgeon: Deboraha Sprang, MD;  Location: [place] [facility];  Service: Cardiovascular;  Laterality: N/A;   PERIPHERAL VASCULAR CATHETERIZATION Left 02/02/2015   Procedure: A/V Shuntogram/Fistulagram;  Surgeon: Algernon Huxley, MD;  Location: [place] [facility];  Service: Cardiovascular;  Laterality: Left;   PERIPHERAL VASCULAR CATHETERIZATION Left 02/02/2015   Procedure: A/V Shunt Intervention;  Surgeon: Algernon Huxley, MD;  Location: [place] [facility];  Service: Cardiovascular;  Laterality: Left;   PERIPHERAL VASCULAR CATHETERIZATION Left 03/09/2015   Procedure: A/V Shuntogram/Fistulagram;  Surgeon: Algernon Huxley, MD;  Location: [place] [facility];  Service: Cardiovascular;  Laterality: Left;   PERIPHERAL VASCULAR CATHETERIZATION N/A  03/09/2015   Procedure: A/V Shunt Intervention;  Surgeon: Algernon Huxley, MD;  Location: [place] [facility];  Service: Cardiovascular;  Laterality: N/A;   PERIPHERAL VASCULAR CATHETERIZATION Left 04/17/2015   Procedure: Upper Extremity Angiography;  Surgeon: Algernon Huxley, MD;  Location: [place] [facility];  Service: Cardiovascular;  Laterality: Left;   PERIPHERAL VASCULAR CATHETERIZATION  04/17/2015   Procedure: Upper Extremity Intervention;  Surgeon: Algernon Huxley, MD;  Location: [place] [facility];  Service: Cardiovascular;;   PERIPHERAL VASCULAR CATHETERIZATION N/A 08/10/2015   Procedure: A/V Shuntogram/Fistulagram;  Surgeon: Algernon Huxley, MD;  Location: [place] [facility];  Service: Cardiovascular;  Laterality: N/A;   PERIPHERAL VASCULAR CATHETERIZATION N/A 08/10/2015   Procedure: A/V Shunt Intervention;  Surgeon: Algernon Huxley, MD;  Location: [place] [facility];  Service: Cardiovascular;  Laterality: N/A;   PERIPHERAL VASCULAR CATHETERIZATION Left 04/11/2016   Procedure: A/V Shuntogram/Fistulagram;  Surgeon: Algernon Huxley, MD;  Location: [place] [facility];  Service: Cardiovascular;  Laterality: Left;   TEE WITHOUT CARDIOVERSION N/A 11/16/2017   Procedure: TRANSESOPHAGEAL ECHOCARDIOGRAM (TEE);  Surgeon: Minna Merritts, MD;  Location: [facility];  Service: Cardiovascular;  Laterality: N/A;    Allergies  Allergen Reactions   Oxycodone Nausea And Vomiting   Oxycodone-Acetaminophen Other (See Comments)   Wound Dressing Adhesive    Hydrocodone     Intolerant more than allergic   Tape Itching    Skin Dermatitis/itching (tape adhesive) Skin Dermatitis/itching (tape adhesive)   Tapentadol Itching    Skin Dermatitis/itching (tape adhesive) Skin Dermatitis/itching (tape adhesive)     Family History  Problem Relation Age of Onset   Breast cancer Father 57   Stroke Mother    Heart attack Mother    Heart Problems Sister    Breast cancer Cousin 70       1 st cousin. Maternal      Social History   Tobacco Use   Smoking status: Former    Packs/day: 0.00    Types: Cigarettes   Smokeless tobacco: Never  Vaping Use   Vaping Use: Never used  Substance Use Topics   Alcohol use: No   Drug use: No     Pertinent GI related history and allergies were reviewed with the patient  Review of Systems  Constitutional:  Negative for activity change, appetite change, chills, diaphoresis, fatigue, fever and unexpected weight change.  HENT:  Negative for trouble swallowing and voice change.   Respiratory:  Negative for shortness of breath and wheezing.   Cardiovascular:  Negative for chest pain, palpitations and leg swelling.  Gastrointestinal:  Positive for blood in stool (dark stools) and diarrhea. Negative for abdominal distention, abdominal pain, anal bleeding, constipation, nausea, rectal pain and vomiting.  Skin:  Negative for color change and pallor.  Neurological:  Negative for dizziness, syncope and weakness.  Psychiatric/Behavioral:  Negative for confusion.   All other systems reviewed and are negative.    Medications Home Medications No current facility-administered medications on file prior to encounter.   Current Outpatient Medications on File Prior to Encounter  Medication Sig Dispense Refill   acetaminophen (TYLENOL) 500 MG tablet Take 1 tablet (500 mg total)  by mouth every 6 (six) hours as needed. 30 tablet 1   apixaban (ELIQUIS) 5 MG TABS tablet Take 1 tablet (5 mg total) by mouth 2 (two) times daily. 60 tablet 0   aspirin EC 81 MG tablet Take 1 tablet (81 mg total) by mouth daily. Swallow whole. 30 tablet 1   atorvastatin (LIPITOR) 80 MG tablet Take 1 tablet (80 mg total) by mouth every evening. 30 tablet 0   cinacalcet (SENSIPAR) 30 MG tablet Take 1 tablet (30 mg total) by mouth daily with breakfast. 60 tablet 1   diphenoxylate-atropine (LOMOTIL) 2.5-0.025 MG tablet SMARTSIG:1 Tablet(s) By Mouth Every 12 Hours PRN 30 tablet 1   fluticasone (FLONASE)  50 MCG/ACT nasal spray Place 1 spray into both nostrils daily. 1 g 0   gabapentin (NEURONTIN) 300 MG capsule Take 1 capsule (300 mg total) by mouth 2 (two) times daily. 60 capsule 0   glucose 4 GM chewable tablet Chew 1 tablet (4 g total) by mouth as needed for low blood sugar. 50 tablet 1   insulin NPH-regular Human (NOVOLIN 70/30) (70-30) 100 UNIT/ML injection Inject 28 Units into the skin 2 (two) times daily with a meal. 10 mL 1   Ipratropium-Albuterol (COMBIVENT) 20-100 MCG/ACT AERS respimat Inhale 1 puff into the lungs every 6 (six) hours. 4 g 0   levETIRAcetam (KEPPRA) 1000 MG tablet Take 1 tablet (1,000 mg total) by mouth daily. 30 tablet 0   levETIRAcetam (KEPPRA) 250 MG tablet Take 1 tablet (250 mg total) by mouth 3 (three) times a week. 12 tablet 0   lidocaine (LIDODERM) 5 % Place 1 patch onto the skin daily. Remove & Discard patch within 12 hours or as directed by MD 30 patch 0   metoprolol succinate (TOPROL-XL) 100 MG 24 hr tablet Take 1 tablet (100 mg total) by mouth daily. 30 tablet 0   omeprazole (PRILOSEC) 20 MG capsule Take 1 capsule (20 mg total) by mouth 2 (two) times daily. 60 capsule 0   torsemide (DEMADEX) 100 MG tablet Take 1 tablet (100 mg total) by mouth daily. 30 tablet 0   traZODone (DESYREL) 50 MG tablet Take 1 tablet (50 mg total) by mouth at bedtime. 30 tablet 0   vitamin D3 (CHOLECALCIFEROL) 25 MCG tablet Take 1 tablet (1,000 Units total) by mouth daily. 60 tablet 1   losartan (COZAAR) 100 MG tablet Take 1 tablet (100 mg total) by mouth at bedtime. 30 tablet 0   methylPREDNISolone (MEDROL) 4 MG tablet Take 1 tablet (4 mg total) by mouth daily. (Patient not taking: Reported on 08/26/2022) 1 tablet 0   sertraline (ZOLOFT) 25 MG tablet Take 1 tablet (25 mg total) by mouth daily. 30 tablet 0   Pertinent GI related medications were reviewed with the patient  Inpatient Medications  Current Facility-Administered Medications:    0.9 %  sodium chloride infusion (Manually  program via Guardrails IV Fluids), , Intravenous, Once, Athena Masse, MD, Stopped at 08/28/22 1149   0.9 %  sodium chloride infusion (Manually program via Guardrails IV Fluids), , Intravenous, Once, Judd Gaudier V, MD   0.9 %  sodium chloride infusion (Manually program via Guardrails IV Fluids), , Intravenous, Once, Enzo Bi, MD   0.9 %  sodium chloride infusion, 10 mL/hr, Intravenous, Once, Nance Pear, MD   acetaminophen (TYLENOL) tablet 650 mg, 650 mg, Oral, Q6H PRN, 650 mg at 08/30/22 0447 **OR** acetaminophen (TYLENOL) suppository 650 mg, 650 mg, Rectal, Q6H PRN, Athena Masse, MD   albuterol (  PROVENTIL) (2.5 MG/3ML) 0.083% nebulizer solution 2.5 mg, 2.5 mg, Nebulization, Q2H PRN, Athena Masse, MD   alteplase (CATHFLO ACTIVASE) injection 2 mg, 2 mg, Intracatheter, Once PRN, Colon Flattery, NP   anticoagulant sodium citrate solution 5 mL, 5 mL, Intracatheter, PRN, Colon Flattery, NP   aspirin EC tablet 81 mg, 81 mg, Oral, Daily, Judd Gaudier V, MD, 81 mg at 08/30/22 1004   atorvastatin (LIPITOR) tablet 80 mg, 80 mg, Oral, QPM, Athena Masse, MD, 80 mg at 08/29/22 2022   Chlorhexidine Gluconate Cloth 2 % PADS 6 each, 6 each, Topical, Q0600, Colon Flattery, NP, 6 each at 08/30/22 0603   cinacalcet (SENSIPAR) tablet 30 mg, 30 mg, Oral, Q breakfast, Judd Gaudier V, MD, 30 mg at 08/30/22 1006   epoetin alfa (EPOGEN) injection 4,000 Units, 4,000 Units, Intravenous, Q M,W,F-HD, Murlean Iba, MD, 4,000 Units at 08/29/22 1905   gabapentin (NEURONTIN) capsule 300 mg, 300 mg, Oral, BID, Judd Gaudier V, MD, 300 mg at 08/30/22 1004   heparin injection 1,000 Units, 1,000 Units, Intracatheter, PRN, Colon Flattery, NP   HYDROcodone-acetaminophen (NORCO/VICODIN) 5-325 MG per tablet 1-2 tablet, 1-2 tablet, Oral, Q4H PRN, Athena Masse, MD, 1 tablet at 08/29/22 2022   Ipratropium-Albuterol (COMBIVENT) respimat 1 puff, 1 puff, Inhalation, Q6H, Athena Masse, MD, 1 puff at  08/30/22 1005   levETIRAcetam (KEPPRA) tablet 1,000 mg, 1,000 mg, Oral, Daily, Judd Gaudier V, MD, 1,000 mg at 08/30/22 1004   levETIRAcetam (KEPPRA) tablet 250 mg, 250 mg, Oral, Once per day on Mon Wed Fri, Duncan, Hazel V, MD, 250 mg at 08/29/22 0859   lidocaine (PF) (XYLOCAINE) 1 % injection 5 mL, 5 mL, Intradermal, PRN, Gwyneth Revels, Shantelle, NP   lidocaine-prilocaine (EMLA) cream 1 Application, 1 Application, Topical, PRN, Colon Flattery, NP   losartan (COZAAR) tablet 100 mg, 100 mg, Oral, QHS, Enzo Bi, MD, 100 mg at 08/29/22 2246   metoprolol succinate (TOPROL-XL) 24 hr tablet 100 mg, 100 mg, Oral, Daily, Judd Gaudier V, MD, 100 mg at 08/30/22 1005   pantoprazole (PROTONIX) EC tablet 40 mg, 40 mg, Oral, Daily, Judd Gaudier V, MD, 40 mg at 08/30/22 1004   pentafluoroprop-tetrafluoroeth (GEBAUERS) aerosol 1 Application, 1 Application, Topical, PRN, Breeze, Benancio Deeds, NP   polyethylene glycol (MIRALAX / GLYCOLAX) packet 34 g, 34 g, Oral, Q2H, Enzo Bi, MD, 34 g at 08/30/22 1208   polyethylene glycol-electrolytes (NuLYTELY) solution 2,000 mL, 2,000 mL, Oral, Once PRN, Annamaria Helling, DO   sertraline (ZOLOFT) tablet 25 mg, 25 mg, Oral, Daily, Enzo Bi, MD, 25 mg at 08/30/22 1005   torsemide (DEMADEX) tablet 100 mg, 100 mg, Oral, Daily, Judd Gaudier V, MD, 100 mg at 08/30/22 1004   traZODone (DESYREL) tablet 50 mg, 50 mg, Oral, QHS, Athena Masse, MD, 50 mg at 08/29/22 2246  sodium chloride     anticoagulant sodium citrate      acetaminophen **OR** acetaminophen, albuterol, alteplase, anticoagulant sodium citrate, heparin, HYDROcodone-acetaminophen, lidocaine (PF), lidocaine-prilocaine, pentafluoroprop-tetrafluoroeth, polyethylene glycol-electrolytes   Objective   Vitals:   08/29/22 1928 08/29/22 2000 08/30/22 0443 08/30/22 0804  BP: (!) 175/66 (!) 168/66 (!) 156/63 (!) 131/57  Pulse: 65 64 72 69  Resp: '20 18 20 16  '$ Temp: 98.4 F (36.9 C) 98.4 F (36.9 C) 98.9 F (37.2 C)  99.1 F (37.3 C)  TempSrc: Oral Oral    SpO2: 93% 98% 90% 91%  Weight: 105.1 kg     Height:         Physical  Exam Vitals and nursing note reviewed.  Constitutional:      General: She is not in acute distress.    Appearance: She is not ill-appearing, toxic-appearing or diaphoretic.     Comments: Lethargic today  HENT:     Head: Normocephalic and atraumatic.     Nose: Nose normal.     Mouth/Throat:     Mouth: Mucous membranes are moist.     Pharynx: Oropharynx is clear.  Eyes:     General: No scleral icterus.    Extraocular Movements: Extraocular movements intact.  Cardiovascular:     Rate and Rhythm: Normal rate.  Pulmonary:     Effort: Pulmonary effort is normal. No respiratory distress.     Breath sounds: Normal breath sounds. No wheezing, rhonchi or rales.  Abdominal:     General: Bowel sounds are normal. There is no distension.     Palpations: Abdomen is soft.     Tenderness: There is no abdominal tenderness. There is no guarding or rebound.  Musculoskeletal:     Cervical back: Neck supple.  Skin:    General: Skin is warm and dry.     Coloration: Skin is not jaundiced or pale.  Neurological:     General: No focal deficit present.     Mental Status: She is alert and oriented to person, place, and time. Mental status is at baseline.  Psychiatric:        Mood and Affect: Mood normal.        Behavior: Behavior normal.        Thought Content: Thought content normal.        Judgment: Judgment normal.     Laboratory Data Recent Labs  Lab 08/26/22 1758 08/27/22 0031 08/28/22 1103 08/29/22 0506 08/30/22 0456  WBC 8.0  --   --  7.7 9.4  HGB 6.2*   < > 7.3* 7.0* 7.7*  HCT 20.8*   < > 23.3* 22.4* 24.1*  PLT 220  --   --  159 156   < > = values in this interval not displayed.    Recent Labs  Lab 08/26/22 1758 08/28/22 0827 08/29/22 0506 08/30/22 0456  NA 139 138 137 137  K 3.9 4.6 4.8 3.8  CL 103 103 102 98  CO2 27  --  21* 27  BUN 27* 38* 62* 35*   CALCIUM 8.5*  --  8.2* 7.8*  PROT 7.1  --   --   --   BILITOT 0.6  --   --   --   ALKPHOS 89  --   --   --   ALT 7  --   --   --   AST 13*  --   --   --   GLUCOSE 149* 103* 110* 149*    No results for input(s): "INR" in the last 168 hours.  No results for input(s): "LIPASE" in the last 72 hours.      Imaging Studies: No results found.  Assessment:   Anemia/Black stool - suspect AOCD; no upper source on egd. 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 CSY canceled today Plan for  colonoscopy 08/31/22 after prep completed Hgb improved to 7.6 with 1 u prbc (from 7) Pt with dark stools per nursing infectious stool w/u pending  Clear liquids today. Npo at midnight Ok to continue golytely/miralax after npo order  Holding eliquis Monitor H&H.  Transfusion and resuscitation as per primary team Avoid frequent lab draws to prevent lab induced anemia Supportive care and antiemetics as per primary team Maintain two sites IV access Avoid nsaids Monitor for GIB.  Colonoscopy with possible biopsy, control of bleeding, polypectomy, and interventions as necessary has been discussed with the patient/patient representative. Informed consent was obtained from the patient/patient representative after explaining the indication, nature, and risks of the procedure including but not limited to death, bleeding, perforation, missed neoplasm/lesions, cardiorespiratory compromise, and reaction to medications. Opportunity for questions was given and appropriate answers were provided. Patient/patient representative has verbalized understanding is amenable to undergoing the procedure.   I personally performed the  service.  Management of other medical comorbidities as per primary team  Thank you for allowing [place] to participate in this patient's care. Please don't hesitate to call if any questions or concerns arise.   Annamaria Helling, DO [facility]  Portions of the record may have been created with voice recognition software. Occasional wrong-word or 'sound-a-like' substitutions may have occurred due to the inherent limitations of voice recognition software.  Read the chart carefully and recognize, using context, where substitutions may have occurred.

## 2022-08-30 NOTE — Progress Notes (Signed)
Patient is not prep for procedure, only had 1 cup of bowel prep liquid.

## 2022-08-30 NOTE — Progress Notes (Signed)
PROGRESS NOTE    Cynthia Dean  HCW:[id number] DOB: [birth year] DOA: 08/26/2022 PCP: Lowella Bandy, MD  114A/114A-AA  LOS: 2 days   Brief hospital course:   Assessment & Plan: Cynthia Dean is a 72 y.o. female with medical history significant for ESRD on HD MWF, insulin requiring T2DM, COPD on home 4 L home O2, CAD, carotid stenosis, seizure disorder, HTN, prior CVA, GERD, GI bleeding 2020 with unremarkable EGD and colonoscopy, left breast ca s/p partial mastectomy and left axillary dissection on 1/76/1607 complicated by left breast abscess with coag negative staph bacteremia as well as small bilateral PE 03/20/2022, currently on Eliquis, who was sent from dialysis to the ED for evaluation of hemoglobin of 5.7, down from baseline of around 7.6-8.3. Rectal exam done in the ED showed brown stool that was guaiac positive.    Acute on chronic blood loss anemia Anemia of chronic kidney disease Chronic anticoagulation with Eliquis History of rectal bleeding 01/2019 with unremarkable EGD and colonoscopy --baseline hemoglobin around 8.0 --Patient with hemoglobin of 5.7 and dialysis and positive guaiac in the ED. --Reports of occasional dark stool stool and symptomatic for fatigue -- s/p 3u pRBC --EGD, unremarkable Plan: --Holding Eliquis (taking for PE since August 2023) --re-attempt bowel prep today, Miralax powder 34 g q2h x 5 doses to be added to all her clear liquid today as her bowel prep.  Will try for colonoscopy tomorrow (Wed)  Chronic anticoagulation History of PE 03/20/2022 S/p partial mastectomy for left breast cancer 03/04/2022 --Patient on chronic anticoagulation with Eliquis for PE developed following her partial mastectomy for breast cancer --Hold Eliquis in the setting of suspected GI bleeding   Chronic diastolic CHF (congestive heart failure) (HCC) --Clinically euvolemic --cont home torsemide   ESRD on hemodialysis ([place]) --discussed with nephrology,  will schedule her dialysis around the colonoscopy (since both can't be done on the same day).   CAD (coronary artery disease) --Continue aspirin and atorvastatin   Essential hypertension --Continue metoprolol and torsemide --cont home losartan nightly   COPD (chronic obstructive pulmonary disease) (HCC) Chronic respiratory failure --Not currently exacerbated --Continue home inhalers and baseline oxygen   Depression Continue sertraline and trazodone   Seizure disorder (HCC) --Continue Keppra   Breast cancer, left S/P partial mastectomy 03/04/2022 --Not followed by oncology.  No oncology follow up scheduled currently - her oncology team reached out for follow up but family declined. Patient's lumpectomy was complicated by abscess and staph bacteremia Patient says she does not think she wants to pursue any further treatment   Insulin-requiring or dependent type II diabetes mellitus ([place]) --recent A1c 7.0 --BG checks without SSI since BG have all been <150 and pt undergoing GI procedure and preps.   History of CVA (cerebrovascular accident) --Continue aspirin and atorvastatin    DVT prophylaxis: SCD/Compression stockings Code Status: Full code  Family Communication: husband updated at bedside today Level of care: Med-Surg Dispo:   The patient is from: home Anticipated d/c is to: home Anticipated d/c date is: possible Thursday   Subjective and Interval History:  Pt could not tolerate her bowel prep, so could not undergo colonoscopy today.  Pt said the prep solution made her feel ill.  Pt was ordered Miralax powder 34 g q2h x 5 doses to be added to all her clear liquid today as her bowel prep.  Will try for colonoscopy tomorrow (Wed), and schedule her dialysis for Thursday.   Objective: Vitals:   08/30/22 0443 08/30/22 0804 08/30/22 1607 08/30/22  2133  BP: (!) 156/63 (!) 131/57 (!) 153/67 (!) 159/71  Pulse: 72 69 67 70  Resp: '20 16  19  '$ Temp: 98.9 F (37.2 C) 99.1 F  (37.3 C) 98.9 F (37.2 C) 99.3 F (37.4 C)  TempSrc:      SpO2: 90% 91% 98% 96%  Weight:      Height:        Intake/Output Summary (Last 24 hours) at 08/30/2022 2145 Last data filed at 08/29/2022 2200 Gross per 24 hour  Intake 200 ml  Output --  Net 200 ml   Filed Weights   08/26/22 2344 08/29/22 1510 08/29/22 1928  Weight: 104.1 kg 106.5 kg 105.1 kg    Examination:   Constitutional: NAD, AAOx3 HEENT: conjunctivae and lids normal, EOMI CV: No cyanosis.   RESP: normal respiratory effort, on RA Neuro: II - XII grossly intact.   Psych: subdued mood and affect.     Data Reviewed: I have personally reviewed labs and imaging studies  Time spent: 35 minutes  Enzo Bi, MD [facility] If 7PM-7AM, please contact night-coverage 08/30/2022, 9:45 PM

## 2022-08-30 NOTE — Progress Notes (Signed)
Patient not doing well with drinking bowel prep, frequently encourage and reminded, states she is trying, explained the importance of drinking the prep; states she know  and is trying her best. At this point  patient had only drank half of a 8 oz cup.

## 2022-08-30 NOTE — Discharge Planning (Signed)
[place]  [address], [place] [address] [phone]  Scheduled Days Monday Wednesday and Friday  Treatment Time: 6:00am

## 2022-08-30 NOTE — Plan of Care (Signed)
  Problem: Education: Goal: Knowledge of General Education information will improve Description: Including pain rating scale, medication(s)/side effects and non-pharmacologic comfort measures Outcome: Progressing   Problem: Health Behavior/Discharge Planning: Goal: Ability to manage health-related needs will improve Outcome: Progressing   Problem: Activity: Goal: Risk for activity intolerance will decrease Outcome: Progressing   Problem: Nutrition: Goal: Adequate nutrition will be maintained Outcome: Progressing   Problem: Coping: Goal: Level of anxiety will decrease Outcome: Progressing   Problem: Pain Managment: Goal: General experience of comfort will improve Outcome: Progressing   Problem: Safety: Goal: Ability to remain free from injury will improve Outcome: Progressing   Problem: Skin Integrity: Goal: Risk for impaired skin integrity will decrease Outcome: Progressing   

## 2022-08-30 NOTE — Progress Notes (Signed)
[facility]  ROUNDING NOTE   Subjective:   Cynthia Dean s is a 72 y.o. 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 Patient presents to ED from dialysis with abnormal labs, hgb 5.7. She has been admitted under observation for Chronic blood loss anemia [D50.0] Gastrointestinal hemorrhage, unspecified gastrointestinal hemorrhage type [K92.2] Anemia, unspecified type [D64.9] Hemoglobin drop [R71.0]  Patient is known to our practice and receives outpatient dialysis treatments at [facility] on a MWF schedule, supervised by Cynthia Dean.    Patient seen resting in bed, ill appearing Unable to tolerate GI prep Somnolent but arousable Mild lower extremity edema   Objective:  Vital signs in last 24 hours:  Temp:  [97.7 F (36.5 C)-99.1 F (37.3 C)] 99.1 F (37.3 C) (02/06 0804) Pulse Rate:  [62-72] 69 (02/06 0804) Resp:  [14-35] 16 (02/06 0804) BP: (127-187)/(57-82) 131/57 (02/06 0804) SpO2:  [88 %-100 %] 91 % (02/06 0804) Weight:  [105.1 kg-106.5 kg] 105.1 kg (02/05 1928)  Weight change:  Filed Weights   08/26/22 2344 08/29/22 1510 08/29/22 1928  Weight: 104.1 kg 106.5 kg 105.1 kg    Intake/Output: I/O last 3 completed shifts: In: 58 [P.O.:200; I.V.:30; Blood:300] Out: 800 [Other:800]   Intake/Output this shift:  No intake/output data recorded.  Physical Exam: General: NAD  Head: Normocephalic, atraumatic. Moist oral mucosal membranes  Eyes: Anicteric  Lungs:  Clear to auscultation, normal effort  Heart: Regular rate and rhythm  Abdomen:  Soft, nontender  Extremities:  Trace-1+ peripheral edema.  Neurologic: Nonfocal, moving all four extremities  Skin: No lesions  Access: Lt AVG    Basic Metabolic Panel: Recent Labs  Lab 08/26/22 1758 08/28/22 0827 08/29/22 0506 08/30/22 0456  NA 139 138 137 137  K 3.9  4.6 4.8 3.8  CL 103 103 102 98  CO2 27  --  21* 27  GLUCOSE 149* 103* 110* 149*  BUN 27* 38* 62* 35*  CREATININE 5.48* 9.10* 9.05* 6.03*  CALCIUM 8.5*  --  8.2* 7.8*  MG  --   --  1.8 1.8  PHOS  --   --  5.5*  --      Liver Function Tests: Recent Labs  Lab 08/26/22 1758  AST 13*  ALT 7  ALKPHOS 89  BILITOT 0.6  PROT 7.1  ALBUMIN 3.1*    No results for input(s): "LIPASE", "AMYLASE" in the last 168 hours. No results for input(s): "AMMONIA" in the last 168 hours.  CBC: Recent Labs  Lab 08/26/22 1758 08/27/22 0031 08/27/22 0804 08/28/22 0827 08/28/22 1103 08/29/22 0506 08/30/22 0456  WBC 8.0  --   --   --   --  7.7 9.4  HGB 6.2*   < > 7.4* 7.8* 7.3* 7.0* 7.7*  HCT 20.8*  --   --  23.0* 23.3* 22.4* 24.1*  MCV 85.2  --   --   --   --  84.2 84.3  PLT 220  --   --   --   --  159 156   < > = values in this interval not displayed.     Cardiac Enzymes: No results for input(s): "CKTOTAL", "CKMB", "CKMBINDEX", "TROPONINI" in the last 168 hours.  BNP: Invalid input(s): "POCBNP"  CBG: Recent Labs  Lab 08/28/22 2035 08/29/22 0803 08/29/22 2253 08/30/22 0757 08/30/22 1220  GLUCAP 117* 101* 131* 141* 134*     Microbiology: Results for  orders placed or performed during the hospital encounter of 08/26/22  MRSA Next Gen by PCR, Nasal     Status: None   Collection Time: 08/28/22  4:20 PM   Specimen: Nasal Mucosa; Nasal Swab  Result Value Ref Range Status   MRSA by PCR Next Gen NOT DETECTED NOT DETECTED Final    Comment: (NOTE) The GeneXpert MRSA Assay (FDA approved for NASAL specimens only), is one component of a comprehensive MRSA colonization surveillance program. It is not intended to diagnose MRSA infection nor to guide or monitor treatment for MRSA infections. Test performance is not FDA approved in patients less than 82 years old. Performed at [facility], [address] [place], [place] [address]     Coagulation Studies: No results for  input(s): "LABPROT", "INR" in the last 72 hours.  Urinalysis: No results for input(s): "COLORURINE", "LABSPEC", "PHURINE", "GLUCOSEU", "HGBUR", "BILIRUBINUR", "KETONESUR", "PROTEINUR", "UROBILINOGEN", "NITRITE", "LEUKOCYTESUR" in the last 72 hours.  Invalid input(s): "APPERANCEUR"    Imaging: No results found.   Medications:    sodium chloride     anticoagulant sodium citrate      sodium chloride   Intravenous Once   sodium chloride   Intravenous Once   sodium chloride   Intravenous Once   aspirin EC  81 mg Oral Daily   atorvastatin  80 mg Oral QPM   Chlorhexidine Gluconate Cloth  6 each Topical Q0600   cinacalcet  30 mg Oral Q breakfast   epoetin (EPOGEN/PROCRIT) injection  4,000 Units Intravenous Q M,W,F-HD   gabapentin  300 mg Oral BID   Ipratropium-Albuterol  1 puff Inhalation Q6H   levETIRAcetam  1,000 mg Oral Daily   levETIRAcetam  250 mg Oral Once per day on Mon Wed Fri   losartan  100 mg Oral QHS   metoprolol succinate  100 mg Oral Daily   pantoprazole  40 mg Oral Daily   polyethylene glycol  34 g Oral Q2H   sertraline  25 mg Oral Daily   torsemide  100 mg Oral Daily   traZODone  50 mg Oral QHS   acetaminophen **OR** acetaminophen, albuterol, alteplase, anticoagulant sodium citrate, heparin, HYDROcodone-acetaminophen, lidocaine (PF), lidocaine-prilocaine, pentafluoroprop-tetrafluoroeth, polyethylene glycol-electrolytes  Assessment/ Plan:  Cynthia Dean is a 72 y.o.  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 Patient presents to ED from dialysis with abnormal labs, hgb 5.7. She has been admitted under observation for Chronic blood loss anemia [D50.0] Gastrointestinal hemorrhage, unspecified gastrointestinal hemorrhage type [K92.2] Anemia, unspecified type [D64.9] Hemoglobin drop [R71.0]  [facility] [place]/MWF/Lt AVG    1. End stage renal disease on hemodialysis. Dialysis received yesterday, UF 837m achieved. Next treatment scheduled for Thursday due to scheduled procedure.   2. Anemia of chronic kidney disease with acute blood loss Lab Results  Component Value Date   HGB 7.7 (L) 08/30/2022   Hgb on admission 5.7 with positive guaiac. Patient has received 3 unit blood transfusion during this admission. GI consulted and EGD on 08/28/22, no signs of active bleeding. Patient unable to tolerate GI prep overnight, Colonoscopy rescheduled for Wednesday.   3. Hypertension with chronic kidney disease. Home regimen includes losartan, metoprolol, and torsemide. Currently receiving these medications.  Blood pressure acceptable.   4. Secondary Hyperparathyroidism: with outpatient labs: PTH 613, phosphorus 4.8, calcium 9.0 on 03/07/22.    :  Lab Results  Component Value Date   PTH 131 (  H) 03/14/2018   CALCIUM 7.8 (L) 08/30/2022   CAION 1.09 (L) 08/28/2022   PHOS 5.5 (H) 08/29/2022    Calcium acceptable at this time. Phosphorus slowly improving. Continue cinacalcet.    LOS: 2 Colon [place] 2/6/202412:44 PM

## 2022-08-31 ENCOUNTER — Inpatient Hospital Stay: Payer: 59 | Admitting: Anesthesiology

## 2022-08-31 ENCOUNTER — Encounter
Admission: EM | Disposition: A | Discharge status: Skilled Nursing Facility | Payer: Self-pay | Source: Home / Self Care | Attending: Osteopathic Medicine

## 2022-08-31 ENCOUNTER — Encounter: Payer: Self-pay | Admitting: Hospitalist

## 2022-08-31 DIAGNOSIS — D62 Acute posthemorrhagic anemia: Secondary | ICD-10-CM | POA: Diagnosis not present

## 2022-08-31 HISTORY — PX: COLONOSCOPY WITH PROPOFOL: SHX5780

## 2022-08-31 LAB — BASIC METABOLIC PANEL
Anion gap: 10 (ref 5–15)
BUN: 43 mg/dL — ABNORMAL HIGH (ref 8–23)
CO2: 26 mmol/L (ref 22–32)
Calcium: 8.3 mg/dL — ABNORMAL LOW (ref 8.9–10.3)
Chloride: 102 mmol/L (ref 98–111)
Creatinine, Ser: 7.53 mg/dL — ABNORMAL HIGH (ref 0.44–1.00)
GFR, Estimated: 5 mL/min — ABNORMAL LOW (ref >60)
Glucose, Bld: 104 mg/dL — ABNORMAL HIGH (ref 70–99)
Potassium: 4.3 mmol/L (ref 3.5–5.1)
Sodium: 138 mmol/L (ref 135–145)

## 2022-08-31 LAB — MAGNESIUM: Magnesium: 1.8 mg/dL (ref 1.7–2.4)

## 2022-08-31 LAB — GLUCOSE, CAPILLARY
Glucose-Capillary: 113 mg/dL — ABNORMAL HIGH (ref 70–99)
Glucose-Capillary: 125 mg/dL — ABNORMAL HIGH (ref 70–99)
Glucose-Capillary: 96 mg/dL (ref 70–99)
Glucose-Capillary: 101 mg/dL — ABNORMAL HIGH (ref 70–99)

## 2022-08-31 LAB — CBC
HCT: 24 % — ABNORMAL LOW (ref 36.0–46.0)
Hemoglobin: 7.5 g/dL — ABNORMAL LOW (ref 12.0–15.0)
MCH: 26.7 pg (ref 26.0–34.0)
MCHC: 31.3 g/dL (ref 30.0–36.0)
MCV: 85.4 fL (ref 80.0–100.0)
Platelets: 175 10*3/uL (ref 150–400)
RBC: 2.81 MIL/uL — ABNORMAL LOW (ref 3.87–5.11)
RDW: 16.9 % — ABNORMAL HIGH (ref 11.5–15.5)
WBC: 8.5 10*3/uL (ref 4.0–10.5)
nRBC: 0 % (ref 0.0–0.2)

## 2022-08-31 SURGERY — COLONOSCOPY WITH PROPOFOL
Anesthesia: General

## 2022-08-31 MED ORDER — POLYETHYLENE GLYCOL 3350 17 G PO PACK
34.0000 g | PACK | ORAL | Status: AC
  Administered 2022-08-31 (×3): 34 g via ORAL
  Filled 2022-08-31 (×3): qty 2

## 2022-08-31 MED ORDER — LIDOCAINE HCL (CARDIAC) PF 100 MG/5ML IV SOSY
PREFILLED_SYRINGE | INTRAVENOUS | Status: DC | PRN
  Administered 2022-08-31: 60 mg via INTRAVENOUS

## 2022-08-31 MED ORDER — PROPOFOL 500 MG/50ML IV EMUL
INTRAVENOUS | Status: DC | PRN
  Administered 2022-08-31: 120 ug/kg/min via INTRAVENOUS

## 2022-08-31 MED ORDER — PROPOFOL 10 MG/ML IV BOLUS
INTRAVENOUS | Status: DC | PRN
  Administered 2022-08-31: 20 mg via INTRAVENOUS

## 2022-08-31 NOTE — Care Management Important Message (Signed)
Important Message  Patient Details  Name: Cynthia Dean MRN: [id number] Date of Birth: May 30, 1951   Medicare Important Message Given:  Yes     [name] 08/31/2022, 10:46 AM

## 2022-08-31 NOTE — Hospital Course (Addendum)
Cynthia Dean is a 72 y.o. female with medical history significant for ESRD on HD MWF, insulin requiring T2DM, COPD on home 4 L home O2, CAD, carotid stenosis, seizure disorder, HTN, prior CVA, GERD, GI bleeding 2020 with unremarkable EGD and colonoscopy, left breast ca s/p partial mastectomy and left axillary dissection on 0000000 complicated by left breast abscess with coag negative staph bacteremia as well as small bilateral PE 03/20/2022, currently on Eliquis, who was sent from dialysis to the ED 08/26/2022 for evaluation of hemoglobin of 5.7, down from baseline of around 7.6-8.3.  02/02 (+)guaiac stool in ED.  Transfused 3 units PRBC.  EGD on 02/04 no concerns. Continued on po protonix.  colonoscopy 02/07 (had been delayed d/t weekend and to dialysis schedule) (+)BRB, diverticular disease, no active bleed noted. GI suspect she had diverticular bleed. 09/01/22 Hgb 7.0, receiving another unit PRBC at HD.  Repeat HD morning, 02/09, CBC shows improved Hgb. Spoke w/ daughter and patient, all questions answered, confirmed stopping Eliquis - pt was set to discharge but then unable to stand up to get dressed, d/c cancelled and PT/OT to see if able to qualify for HH/SNF 02/10 PT recs for SNF, TOC consulted. 02/11 (Sunday) pending placement. 02/13: [facility] pending   Consultants:  Nephrology Gastroenterology   Procedures: EGD 08/28/22 Colonoscopy 08/31/22      ASSESSMENT & PLAN:   Principal Problem:   Acute on chronic blood loss anemia Active Problems:   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-stage renal disease) (HCC)   History of lower GI bleeding   Chronic anticoagulation   History of pulmonary embolism 03/20/2022   ESRD on hemodialysis (HCC)   Chronic diastolic CHF (congestive heart failure) (HCC)   CAD (coronary artery disease)   Essential hypertension   COPD (chronic obstructive pulmonary disease) (HCC)   Chronic respiratory failure with hypoxia (HCC)   Depression   Seizure disorder (HCC)    History of CVA (cerebrovascular accident)   Insulin-requiring or dependent type II diabetes mellitus ([place])   Breast cancer, left S/P partial mastectomy,03/04/2022   Hemoglobin drop   Cough, sinus congestion - improved CXR on personal review appears more pulmonary vascular congestion but ddx would include PNA vs viral respiratory illness +/- COPD - less likely given improvement in symptoms this morning, neg Flu/COVID/viral panel  Albuterol prn  Cognitive impairment / dementia Limits decision making capacity Stable  Acute on chronic blood loss anemia Anemia of chronic kidney disease Chronic anticoagulation with Eliquis - discontinued in shared decision making w/ her daughter  History of rectal bleeding 01/2019 with unremarkable EGD and colonoscopy baseline hemoglobin around 8.0 s/p total 4u pRBC this admission EGD, unremarkable Colonoscopy, diverticular disease, BRB, likely diverticular bleed  Discontinuing Eliquis (taking for PE since August 2023) Hgb will need frequent monitoring outpatient    Chronic anticoagulation History of PE 03/20/2022 S/p partial mastectomy for left breast cancer 03/04/2022 Discontinuing Eliquis in the setting of GI bleeding and adeuqate treatment for primary VTE   Chronic diastolic CHF (congestive heart failure) (HCC) Clinically euvolemic cont home torsemide   ESRD on hemodialysis [facility]) Nephrology following Dialysis as scheduled    CAD (coronary artery disease) Continue aspirin and atorvastatin   Essential hypertension Continue metoprolol and torsemide cont home losartan nightly   COPD (chronic obstructive pulmonary disease) ([place]) Chronic respiratory failure on 4L O2 Not currently exacerbated Continue home inhalers and baseline oxygen   Depression Continue sertraline and trazodone   Seizure disorder ([place]) Continue Keppra   Breast cancer, left S/P partial mastectomy  03/04/2022 Her oncology team reached out for follow up but family  declined. Patient's lumpectomy was complicated by abscess and staph bacteremia Patient says she does not think she wants to pursue any further treatment   Insulin-requiring or dependent type II diabetes mellitus ([place]) recent A1c 7.0 SSI achs   History of CVA (cerebrovascular accident) Continue aspirin and atorvastatin    DVT prophylaxis: SCD/compression hose  Pertinent IV fluids/nutrition: none Central lines / invasive devices: L chest port    Code Status: FULL CODE  Current Admission Status: inpatient  Med/Surg  TOC needs / Dispo plan: SNF Barriers to discharge / significant pending items: placement SNF auth pending

## 2022-08-31 NOTE — Progress Notes (Addendum)
[facility]  ROUNDING NOTE   Subjective:   Cynthia Dean s is a 72 y.o. 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 Patient presents to ED from dialysis with abnormal labs, hgb 5.7. She has been admitted under observation for Chronic blood loss anemia [D50.0] Gastrointestinal hemorrhage, unspecified gastrointestinal hemorrhage type [K92.2] Anemia, unspecified type [D64.9] Hemoglobin drop [R71.0]  Patient is known to our practice and receives outpatient dialysis treatments at [facility] on a MWF schedule, supervised by Dr. Holley Raring.    Patient laying in bed, alert and oriented Complains of abdominal discomfort.  NPO for GI procedure Continues to drink prep for procedure   Objective:  Vital signs in last 24 hours:  Temp:  [98.4 F (36.9 C)-99.3 F (37.4 C)] 98.4 F (36.9 C) (02/07 1202) Pulse Rate:  [67-72] 72 (02/07 1202) Resp:  [16-20] 17 (02/07 1202) BP: (149-159)/(59-71) 158/62 (02/07 1202) SpO2:  [90 %-98 %] 91 % (02/07 1202)  Weight change:  Filed Weights   08/26/22 2344 08/29/22 1510 08/29/22 1928  Weight: 104.1 kg 106.5 kg 105.1 kg    Intake/Output: I/O last 3 completed shifts: In: 200 [P.O.:200] Out: 800 [Other:800]   Intake/Output this shift:  No intake/output data recorded.  Physical Exam: General: NAD  Head: Normocephalic, atraumatic. Moist oral mucosal membranes  Eyes: Anicteric  Lungs:  Basilar wheeze, normal effort  Heart: Regular rate and rhythm  Abdomen:  Soft, nontender  Extremities:  No peripheral edema.  Neurologic: Alert and oriented, moving all four extremities  Skin: No lesions  Access: Lt AVG    Basic Metabolic Panel: Recent Labs  Lab 08/26/22 1758 08/28/22 0827 08/29/22 0506 08/30/22 0456 08/31/22 0413  NA 139 138 137 137 138  K 3.9 4.6 4.8 3.8 4.3  CL 103 103 102 98 102   CO2 27  --  21* 27 26  GLUCOSE 149* 103* 110* 149* 104*  BUN 27* 38* 62* 35* 43*  CREATININE 5.48* 9.10* 9.05* 6.03* 7.53*  CALCIUM 8.5*  --  8.2* 7.8* 8.3*  MG  --   --  1.8 1.8 1.8  PHOS  --   --  5.5*  --   --      Liver Function Tests: Recent Labs  Lab 08/26/22 1758  AST 13*  ALT 7  ALKPHOS 89  BILITOT 0.6  PROT 7.1  ALBUMIN 3.1*    No results for input(s): "LIPASE", "AMYLASE" in the last 168 hours. No results for input(s): "AMMONIA" in the last 168 hours.  CBC: Recent Labs  Lab 08/26/22 1758 08/27/22 0031 08/28/22 0827 08/28/22 1103 08/29/22 0506 08/30/22 0456 08/31/22 0413  WBC 8.0  --   --   --  7.7 9.4 8.5  HGB 6.2*   < > 7.8* 7.3* 7.0* 7.7* 7.5*  HCT 20.8*  --  23.0* 23.3* 22.4* 24.1* 24.0*  MCV 85.2  --   --   --  84.2 84.3 85.4  PLT 220  --   --   --  159 156 175   < > = values in this interval not displayed.     Cardiac Enzymes: No results for input(s): "CKTOTAL", "CKMB", "CKMBINDEX", "TROPONINI" in the last 168 hours.  BNP: Invalid input(s): "POCBNP"  CBG: Recent Labs  Lab 08/30/22 1220 08/30/22 1640 08/30/22 2135 08/31/22 0748 08/31/22 1159  GLUCAP 134* 139* 146* 113* 125*     Microbiology: Results for orders  placed or performed during the hospital encounter of 08/26/22  MRSA Next Gen by PCR, Nasal     Status: None   Collection Time: 08/28/22  4:20 PM   Specimen: Nasal Mucosa; Nasal Swab  Result Value Ref Range Status   MRSA by PCR Next Gen NOT DETECTED NOT DETECTED Final    Comment: (NOTE) The GeneXpert MRSA Assay (FDA approved for NASAL specimens only), is one component of a comprehensive MRSA colonization surveillance program. It is not intended to diagnose MRSA infection nor to guide or monitor treatment for MRSA infections. Test performance is not FDA approved in patients less than 55 years old. Performed at [facility], [address] [place], [place] [address]   Gastrointestinal Panel by PCR , Stool      Status: None   Collection Time: 08/29/22  3:44 PM   Specimen: Stool  Result Value Ref Range Status   Campylobacter species NOT DETECTED NOT DETECTED Final   Plesimonas shigelloides NOT DETECTED NOT DETECTED Final   Salmonella species NOT DETECTED NOT DETECTED Final   Yersinia enterocolitica NOT DETECTED NOT DETECTED Final   Vibrio species NOT DETECTED NOT DETECTED Final   Vibrio cholerae NOT DETECTED NOT DETECTED Final   Enteroaggregative E coli (EAEC) NOT DETECTED NOT DETECTED Final   Enteropathogenic E coli (EPEC) NOT DETECTED NOT DETECTED Final   Enterotoxigenic E coli (ETEC) NOT DETECTED NOT DETECTED Final   Shiga like toxin producing E coli (STEC) NOT DETECTED NOT DETECTED Final   Shigella/Enteroinvasive E coli (EIEC) NOT DETECTED NOT DETECTED Final   Cryptosporidium NOT DETECTED NOT DETECTED Final   Cyclospora cayetanensis NOT DETECTED NOT DETECTED Final   Entamoeba histolytica NOT DETECTED NOT DETECTED Final   Giardia lamblia NOT DETECTED NOT DETECTED Final   Adenovirus F40/41 NOT DETECTED NOT DETECTED Final   Astrovirus NOT DETECTED NOT DETECTED Final   Norovirus GI/GII NOT DETECTED NOT DETECTED Final   Rotavirus A NOT DETECTED NOT DETECTED Final   Sapovirus (I, II, IV, and V) NOT DETECTED NOT DETECTED Final    Comment: Performed at [facility], [address] [place], [place] [address]    Coagulation Studies: No results for input(s): "LABPROT", "INR" in the last 72 hours.  Urinalysis: No results for input(s): "COLORURINE", "LABSPEC", "PHURINE", "GLUCOSEU", "HGBUR", "BILIRUBINUR", "KETONESUR", "PROTEINUR", "UROBILINOGEN", "NITRITE", "LEUKOCYTESUR" in the last 72 hours.  Invalid input(s): "APPERANCEUR"    Imaging: No results found.   Medications:    sodium chloride     anticoagulant sodium citrate      sodium chloride   Intravenous Once   sodium chloride   Intravenous Once   sodium chloride   Intravenous Once   aspirin EC  81 mg Oral Daily    atorvastatin  80 mg Oral QPM   Chlorhexidine Gluconate Cloth  6 each Topical Q0600   cinacalcet  30 mg Oral Q breakfast   epoetin (EPOGEN/PROCRIT) injection  4,000 Units Intravenous Q M,W,F-HD   gabapentin  300 mg Oral BID   Ipratropium-Albuterol  1 puff Inhalation Q6H   levETIRAcetam  1,000 mg Oral Daily   levETIRAcetam  250 mg Oral Once per day on Mon Wed Fri   losartan  100 mg Oral QHS   metoprolol succinate  100 mg Oral Daily   pantoprazole  40 mg Oral Daily   polyethylene glycol  34 g Oral Q2H   sertraline  25 mg Oral Daily   torsemide  100 mg Oral Daily   traZODone  50 mg Oral  QHS   acetaminophen **OR** acetaminophen, albuterol, alteplase, anticoagulant sodium citrate, heparin, HYDROcodone-acetaminophen, lidocaine (PF), lidocaine-prilocaine, pentafluoroprop-tetrafluoroeth, polyethylene glycol-electrolytes  Assessment/ Plan:  Ms. Trellis Guirguis is a 72 y.o.  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 Patient presents to ED from dialysis with abnormal labs, hgb 5.7. She has been admitted under observation for Chronic blood loss anemia [D50.0] Gastrointestinal hemorrhage, unspecified gastrointestinal hemorrhage type [K92.2] Anemia, unspecified type [D64.9] Hemoglobin drop [R71.0]  [facility] [place]/MWF/Lt AVG   1. End stage renal disease on hemodialysis. Due to scheduled procedure, will reschedule dialysis to tomorrow. Evaluation of labs and respiratory status, patient stable at this time for delay in treatment.   2. Anemia of chronic kidney disease with acute blood loss Lab Results  Component Value Date   HGB 7.5 (L) 08/31/2022   Hgb on admission 5.7 with positive guaiac. Patient has received 3 unit blood transfusion during this admission. GI consulted and EGD on 08/28/22, no signs of active bleeding. Colonoscopy scheduled for later today.    3. Hypertension with chronic kidney disease. Home regimen includes losartan, metoprolol, and torsemide. Currently receiving these medications.  Blood pressure 158/62, acceptable for this patient.   4. Secondary Hyperparathyroidism: with outpatient labs: PTH 613, phosphorus 4.8, calcium 9.0 on 03/07/22.    :  Lab Results  Component Value Date   PTH 131 (H) 03/14/2018   CALCIUM 8.3 (L) 08/31/2022   CAION 1.09 (L) 08/28/2022   PHOS 5.5 (H) 08/29/2022    Monitoring bone minerals during this admission. Continue cinacalcet.    LOS: 3   2/7/20241:27 PM

## 2022-08-31 NOTE — Anesthesia Postprocedure Evaluation (Signed)
Anesthesia Post Note  [name]  Procedure(s) Performed: COLONOSCOPY WITH PROPOFOL  Patient location during evaluation: PACU Anesthesia Type: General Level of consciousness: awake and alert Pain management: pain level controlled Vital Signs Assessment: post-procedure vital signs reviewed and stable Respiratory status: spontaneous breathing, nonlabored ventilation, respiratory function stable and patient connected to nasal cannula oxygen Cardiovascular status: blood pressure returned to baseline and stable Postop Assessment: no apparent nausea or vomiting Anesthetic complications: no   No notable events documented.   Last Vitals:  Vitals:   08/31/22 1611 08/31/22 1656  BP: 131/62 137/61  Pulse:  66  Resp: 17 20  Temp:  36.8 C  SpO2: 100% 98%    Last Pain:  Vitals:   08/31/22 1603  TempSrc: Temporal  PainSc:                  Precious Haws Saurav Crumble

## 2022-08-31 NOTE — Care Plan (Signed)
Brief GI Post OP note  See op report for further details Before starting procedure blood and food residue noted on patient's sheet DRE revealed blood and clot.  Colonoscopy was performed and majority of the blood and clot were in the rectum.  This dissipated as you moved more proximal in the colon likely secondary to backwash.  The TI was intubated with no signs of melena or blood.  Suspect diverticular bleeding versus less likely hemorrhoidal. Continue to monitor H&H.  Transfusion resuscitation as per primary team  No active source of bleeding was appreciated despite red blood and clot most prominent in the distal colon. Suspect diverticular bleeding over hemorrhoidal. Should this continue or hemoglobin continue to fall- recommend tagged rbc gi bleed scan or CTA (if hemodynamically unstable) for localization and potential Vascular Surgery/Interventional Radiology intervention. This was discussed with the hospitalist team.  These findings were communicated to the patient's significant other via telephone  GI to sign off, but available as needed.  Ronne Binning, [facility]

## 2022-08-31 NOTE — Interval H&P Note (Signed)
History and Physical Interval Note: Progress note from 08/30/2022.  Patient is completing more of her prep.  Hemoglobin stable at 7.5 today.  Vital signs stable.  N.p.o. since midnight.  No further GI complaints.  Infectious workup of stool was negative. No other interval change after seeing and examining the patient.  Written consent was obtained from the patient after discussion of risks, benefits, and alternatives. Patient has consented to proceed with Colonoscopy with possible intervention  Colonoscopy with possible biopsy, control of bleeding, polypectomy, and interventions as necessary has been discussed with the patient/patient representative. Informed consent was obtained from the patient/patient representative after explaining the indication, nature, and risks of the procedure including but not limited to death, bleeding, perforation, missed neoplasm/lesions, cardiorespiratory compromise, and reaction to medications. Opportunity for questions was given and appropriate answers were provided. Patient/patient representative has verbalized understanding is amenable to undergoing the procedure.    08/31/2022 2:42 PM  Cynthia Dean  has presented today for surgery, with the diagnosis of anemia, melena.  The various methods of treatment have been discussed with the patient and family. After consideration of risks, benefits and other options for treatment, the patient has consented to  Procedure(s): COLONOSCOPY WITH PROPOFOL (N/A) as a surgical intervention.  The patient's history has been reviewed, patient examined, no change in status, stable for surgery.  I have reviewed the patient's chart and labs.  Questions were answered to the patient's satisfaction.     Annamaria Helling

## 2022-08-31 NOTE — Op Note (Addendum)
[facility] Gastroenterology Patient Name: Cynthia Dean Procedure Date: 08/31/2022 3:08 PM MRN: [id number] Account #: [id number] Date of Birth: 17-Apr-1951 Admit Type: Inpatient Age: 72 Room: [facility] ENDO ROOM 2 Gender: Female Note Status: Supervisor Override Instrument Name: Jasper Riling [id number] Procedure:             Colonoscopy Indications:           Anemia Providers:             Annamaria Helling DO, DO Medicines:             Monitored Anesthesia Care Complications:         No immediate complications. Estimated blood loss: None. Procedure:             Pre-Anesthesia Assessment:                        - Prior to the procedure, a History and Physical was                         performed, and patient medications and allergies were                         reviewed. The patient is competent. The risks and                         benefits of the procedure and the sedation options and                         risks were discussed with the patient. All questions                         were answered and informed consent was obtained.                         Patient identification and proposed procedure were                         verified by the physician, the nurse, the anesthetist                         and the technician in the endoscopy suite. Mental                         Status Examination: alert and oriented. Airway                         Examination: normal oropharyngeal airway and neck                         mobility. Respiratory Examination: clear to                         auscultation. CV Examination: RRR, no murmurs, no S3                         or S4. Prophylactic Antibiotics: The patient does not                         require prophylactic antibiotics.  Prior                         Anticoagulants: The patient has taken Eliquis                         (apixaban), last dose was 4 days prior to procedure.                         ASA Grade Assessment:  III - A patient with severe                         systemic disease. After reviewing the risks and                         benefits, the patient was deemed in satisfactory                         condition to undergo the procedure. The anesthesia                         plan was to use monitored anesthesia care (MAC).                         Immediately prior to administration of medications,                         the patient was re-assessed for adequacy to receive                         sedatives. The heart rate, respiratory rate, oxygen                         saturations, blood pressure, adequacy of pulmonary                         ventilation, and response to care were monitored                         throughout the procedure. The physical status of the                         patient was re-assessed after the procedure.                        After obtaining informed consent, the colonoscope was                         passed under direct vision. Throughout the procedure,                         the patient's blood pressure, pulse, and oxygen                         saturations were monitored continuously. The                         Colonoscope was introduced through the anus and  advanced to the the terminal ileum, with                         identification of the appendiceal orifice and IC                         valve. The colonoscopy was performed without                         difficulty. The patient tolerated the procedure well.                         The quality of the bowel preparation was evaluated                         using the [facility] Bowel Preparation Scale) with                         scores of: Right Colon = 2 (minor amount of residual                         staining, small fragments of stool and/or opaque                         liquid, but mucosa seen well), Transverse Colon = 2                         (minor amount of residual  staining, small fragments of                         stool and/or opaque liquid, but mucosa seen well) and                         Left Colon = 1 (portion of mucosa seen, but other                         areas not well seen due to staining, residual stool                         and/or opaque liquid). The total BBPS score equals 5.                         Fair prep. Food/clot residue and debris in the rectum                         interfering with visual. The terminal ileum, ileocecal                         valve, appendiceal orifice, and rectum were                         photographed. Findings:      The digital rectal exam findings include blood, clot, food residue. also       in the bed. Pertinent negatives include normal sphincter tone.      The terminal ileum appeared normal. no blood - fresh or old noted in TI  Clotted blood was found in the entire colon. Blood/clot back wash       through the colon. diminished as moving more proximally in the colon.       Most of the blood/clot/food residue was appreciated in the rectum. No       active bleeding source was appreciated. Estimated blood loss: none.      Multiple small-mouthed diverticula were found in the entire colon.       Estimated blood loss: none.      Non-bleeding internal hemorrhoids were found during retroflexion. The       hemorrhoids were Grade I (internal hemorrhoids that do not prolapse).       food residue did interfere with this visualization      The exam was otherwise without abnormality on direct and retroflexion       views. Impression:            - blood, clot, food residue. also in the bed found on                         digital rectal exam.                        - The examined portion of the ileum was normal.                        - Blood in the entire examined colon.                        - Diverticulosis in the entire examined colon.                        - Non-bleeding internal hemorrhoids.                         - The examination was otherwise normal on direct and                         retroflexion views.                        - No specimens collected. Recommendation:        - Return patient to hospital ward for ongoing care.                        - Advance diet as tolerated.                        - Continue present medications.                        - The findings and recommendations were discussed with                         the patient.                        - The findings and recommendations were discussed with                         the patient's family.                        -  The findings and recommendations were discussed with                         the referring physician.                        - No active source of bleeding was appreciated despite                         red blood and clot most prominent in the distal colon.                         Suspect diverticular bleeding over hemorrhoidal.                         Should this continue or hemoglobin continue to fall-                         recommend tagged rbc gi bleed scan or CTA (if                         hemodynamically unstable) for localization and                         potential Vascular Surgery/Interventional Radiology                         intervention. This was discussed with the hospitalist                         team.                        GI to sign off, but available as needed. Procedure Code(s):     --- Professional ---                        [phone], Colonoscopy, flexible; diagnostic, including                         collection of specimen(s) by brushing or washing, when                         performed (separate procedure) Diagnosis Code(s):     --- Professional ---                        K64.0, First degree hemorrhoids                        K92.2, Gastrointestinal hemorrhage, unspecified                        D64.9, Anemia, unspecified                        K57.30,  Diverticulosis of large intestine without                         perforation or abscess without bleeding CPT copyright 2022 [employer]. All rights reserved. The codes documented in this report are preliminary and  upon coder review may  be revised to meet current compliance requirements. Attending Participation:      I personally performed the entire procedure. Volney American, DO Annamaria Helling DO, DO 08/31/2022 4:02:38 PM This report has been signed electronically. Number of Addenda: 0 Note Initiated On: 08/31/2022 3:08 PM Scope Withdrawal Time: 0 hours 10 minutes 57 seconds  Total Procedure Duration: 0 hours 14 minutes 48 seconds  Estimated Blood Loss:  Estimated blood loss: none.      [facility]

## 2022-08-31 NOTE — Anesthesia Preprocedure Evaluation (Addendum)
Anesthesia Evaluation  Patient identified by MRN, date of birth, ID band Patient awake    Reviewed: Allergy & Precautions, NPO status , Patient's Chart, lab work & pertinent test results  Airway Mallampati: II  TM Distance: >3 FB Neck ROM: full    Dental  (+) Poor Dentition, Missing   Pulmonary COPD,  COPD inhaler, Patient abstained from smoking., former smoker   Pulmonary exam normal  + decreased breath sounds      Cardiovascular Exercise Tolerance: Poor hypertension, Pt. on medications + CAD, +CHF and + DOE  Normal cardiovascular exam Rhythm:Regular     Neuro/Psych Seizures -,    Depression    CVA negative neurological ROS  negative psych ROS   GI/Hepatic negative GI ROS, Neg liver ROS,GERD  ,,  Endo/Other  diabetes  Morbid obesity  Renal/GU DialysisRenal diseasenegative Renal ROS  negative genitourinary   Musculoskeletal  (+) Arthritis ,    Abdominal  (+) + obese  Peds negative pediatric ROS (+)  Hematology negative hematology ROS (+) Blood dyscrasia, anemia   Anesthesia Other Findings Past Medical History: 07/30/2019: Acute on chronic respiratory failure with hypoxia ([place]) 12/31/2018: Acute respiratory failure with hypoxia (H[facility]) No date: Carotid arterial disease ([place])     Comment:  a. 02/2018 < bilat ICA stenosis. No date: COPD (chronic obstructive pulmonary disease) (HCC) No date: Coronary artery disease     Comment:  a. 11/2015 MV: EF 57%, no ischemia/infarct. No date: Cryptogenic stroke [facility])     Comment:  a. 10/2017 s/p implantable loop recorder (no Afib to               date). No date: Diabetes mellitus without complication (HCC) No date: Diarrhea No date: ESRD on dialysis [facility]) No date: GI bleed     Comment:  a. 01/2019 EGD/Colonoscopy: Mild gastritis. Colitis. No date: Heart murmur     Comment:  a. 12/2018 Echo: EF 55-60%, impaired relaxation. Mod dil               LA. Mildly dil RA. Mild Ca2+ of  AoV. 06/27/2017: Hematemesis 07/30/2019: History of 2019 novel coronavirus disease (COVID-19) No date: Hyperkalemia No date: Hyperlipidemia No date: Hypertension 08/08/2015: Intractable nausea and vomiting 05/30/2018: Nausea & vomiting 05/29/2018: Nausea and vomiting No date: Pneumonia due to COVID-19 virus  Past Surgical History: 12/20/2016: A/V FISTULAGRAM; Left     Comment:  Procedure: A/V Fistulagram;  Surgeon: Katha Cabal, MD;  Location: [place] [facility];  Service:               Cardiovascular;  Laterality: Left; 12/20/2016: A/V SHUNT INTERVENTION; N/A     Comment:  Procedure: A/V Shunt Intervention;  Surgeon: Katha Cabal, MD;  Location: [place] [facility];  Service:              Cardiovascular;  Laterality: N/A; 09/11/2017: A/V SHUNTOGRAM; Left     Comment:  Procedure: A/V SHUNTOGRAM;  Surgeon: Algernon Huxley, MD;                Location: [place] [facility];  Service: Cardiovascular;              Laterality: Left; 11/21/2019: A/V SHUNTOGRAM; Left     Comment:  Procedure: A/V SHUNTOGRAM;  Surgeon: Algernon Huxley, MD;  Location: [place] [facility];  Service: Cardiovascular;              Laterality: Left; 07/19/2000: BREAST BIOPSY; Bilateral     Comment:  neg 11/03/2021: BREAST BIOPSY; Left     Comment:  [place] bx mass at 3:00, venus marker, axilla bx-hyrdo               marker, path pending 02/16/2019: COLONOSCOPY WITH PROPOFOL; N/A     Comment:  Procedure: COLONOSCOPY WITH PROPOFOL;  Surgeon: [place],               Benay Pike, MD;  Location: [facility] ENDOSCOPY;  Service:               Gastroenterology;  Laterality: N/A; 02/16/2019: ESOPHAGOGASTRODUODENOSCOPY (EGD) WITH PROPOFOL; N/A     Comment:  Procedure: ESOPHAGOGASTRODUODENOSCOPY (EGD) WITH               PROPOFOL;  Surgeon: Toledo, Benay Pike, MD;  Location:               [facility] ENDOSCOPY;  Service: Gastroenterology;  Laterality:               N/A; 08/28/2022:  ESOPHAGOGASTRODUODENOSCOPY (EGD) WITH PROPOFOL; N/A     Comment:  Procedure: ESOPHAGOGASTRODUODENOSCOPY (EGD) WITH               PROPOFOL;  Surgeon: Annamaria Helling, DO;  Location:              [facility] ENDOSCOPY;  Service: Gastroenterology;  Laterality:               N/A; 03/25/2022: IR US GUIDE BX ASP/DRAIN 11/16/2017: LOOP RECORDER INSERTION; N/A     Comment:  Procedure: LOOP RECORDER INSERTION;  Surgeon: Deboraha Sprang, MD;  Location: [place] [facility];  Service:               Cardiovascular;  Laterality: N/A; 02/02/2015: PERIPHERAL VASCULAR CATHETERIZATION; Left     Comment:  Procedure: A/V Shuntogram/Fistulagram;  Surgeon: Algernon Huxley, MD;  Location: [place] [facility];  Service:               Cardiovascular;  Laterality: Left; 02/02/2015: PERIPHERAL VASCULAR CATHETERIZATION; Left     Comment:  Procedure: A/V Shunt Intervention;  Surgeon: Algernon Huxley, MD;  Location: [place] [facility];  Service:               Cardiovascular;  Laterality: Left; 03/09/2015: PERIPHERAL VASCULAR CATHETERIZATION; Left     Comment:  Procedure: A/V Shuntogram/Fistulagram;  Surgeon: Algernon Huxley, MD;  Location: [place] [facility];  Service:               Cardiovascular;  Laterality: Left; 03/09/2015: PERIPHERAL VASCULAR CATHETERIZATION; N/A     Comment:  Procedure: A/V Shunt Intervention;  Surgeon: Algernon Huxley, MD;  Location: [place] [facility];  Service:               Cardiovascular;  Laterality: N/A; 04/17/2015: PERIPHERAL VASCULAR CATHETERIZATION; Left     Comment:  Procedure: Upper Extremity  Angiography;  Surgeon: Algernon Huxley, MD;  Location: [place] [facility];  Service:               Cardiovascular;  Laterality: Left; 04/17/2015: PERIPHERAL VASCULAR CATHETERIZATION     Comment:  Procedure: Upper Extremity Intervention;  Surgeon: Algernon Huxley, MD;  Location: [place] [facility];  Service:                Cardiovascular;; 08/10/2015: PERIPHERAL VASCULAR CATHETERIZATION; N/A     Comment:  Procedure: A/V Shuntogram/Fistulagram;  Surgeon: Algernon Huxley, MD;  Location: [place] [facility];  Service:               Cardiovascular;  Laterality: N/A; 08/10/2015: PERIPHERAL VASCULAR CATHETERIZATION; N/A     Comment:  Procedure: A/V Shunt Intervention;  Surgeon: Algernon Huxley, MD;  Location: [place] [facility];  Service:               Cardiovascular;  Laterality: N/A; 04/11/2016: PERIPHERAL VASCULAR CATHETERIZATION; Left     Comment:  Procedure: A/V Shuntogram/Fistulagram;  Surgeon: Algernon Huxley, MD;  Location: [place] [facility];  Service:               Cardiovascular;  Laterality: Left; 11/16/2017: TEE WITHOUT CARDIOVERSION; N/A     Comment:  Procedure: TRANSESOPHAGEAL ECHOCARDIOGRAM (TEE);                Surgeon: Minna Merritts, MD;  Location: [facility] ORS;                Service: Cardiovascular;  Laterality: N/A;  BMI    Body Mass Index: 34.22 kg/m      Reproductive/Obstetrics negative OB ROS                              Anesthesia Physical Anesthesia Plan  ASA: 3  Anesthesia Plan: General   Post-op Pain Management:    Induction: Intravenous  PONV Risk Score and Plan: Propofol infusion and TIVA  Airway Management Planned: Natural Airway and Nasal Cannula  Additional Equipment:   Intra-op Plan:   Post-operative Plan:   Informed Consent: I have reviewed the patients History and Physical, chart, labs and discussed the procedure including the risks, benefits and alternatives for the proposed anesthesia with the patient or authorized representative who has indicated his/her understanding and acceptance.     Dental Advisory Given  Plan Discussed with: CRNA and Surgeon  Anesthesia Plan Comments:         Anesthesia Quick Evaluation

## 2022-08-31 NOTE — Transfer of Care (Signed)
Immediate Anesthesia Transfer of Care Note  [name]  Procedure(s) Performed: COLONOSCOPY WITH PROPOFOL  Patient Location: Endoscopy Unit  Anesthesia Type:General  Level of Consciousness: drowsy  Airway & Oxygen Therapy: Patient Spontanous Breathing  Post-op Assessment: Report given to RN  Post vital signs: stable  Last Vitals:  Vitals Value Taken Time  BP    Temp    Pulse    Resp    SpO2      Last Pain:  Vitals:   08/31/22 1426  TempSrc: Temporal  PainSc: 0-No pain      Patients Stated Pain Goal: 0 (97/74/14 2395)  Complications: No notable events documented.

## 2022-08-31 NOTE — Progress Notes (Signed)
PROGRESS NOTE    Cynthia Dean   TIW:[id number] DOB: Nov 19, 1950  DOA: 08/26/2022 Date of Service: 08/31/22 PCP: Lowella Bandy, MD     Brief Narrative / Hospital Course:  Cynthia Dean is a 72 y.o. female with medical history significant for ESRD on HD MWF, insulin requiring T2DM, COPD on home 4 L home O2, CAD, carotid stenosis, seizure disorder, HTN, prior CVA, GERD, GI bleeding 2020 with unremarkable EGD and colonoscopy, left breast ca s/p partial mastectomy and left axillary dissection on 2/50/5397 complicated by left breast abscess with coag negative staph bacteremia as well as small bilateral PE 03/20/2022, currently on Eliquis, who was sent from dialysis to the ED 08/26/2022 for evaluation of hemoglobin of 5.7, down from baseline of around 7.6-8.3. (+)guaiac stool in ED. Transfused 3 units PRBC. EGD on 02/04 no concerns. Continued on po protonix. Planned colonoscopy 02/07 which had been delayed d/t weekend and to dialysis schedule.   Consultants:  Nephrology Gastroenterology   Procedures: EGD 08/28/22 [Planned colonoscopy]      ASSESSMENT & PLAN:   Principal Problem:   Acute on chronic blood loss anemia Active Problems:   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-stage renal disease) (HCC)   History of lower GI bleeding   Chronic anticoagulation   History of pulmonary embolism 03/20/2022   ESRD on hemodialysis (HCC)   Chronic diastolic CHF (congestive heart failure) (HCC)   CAD (coronary artery disease)   Essential hypertension   COPD (chronic obstructive pulmonary disease) (HCC)   Chronic respiratory failure with hypoxia (HCC)   Depression   Seizure disorder (HCC)   History of CVA (cerebrovascular accident)   Insulin-requiring or dependent type II diabetes mellitus ([place])   Breast cancer, left S/P partial mastectomy,03/04/2022   Hemoglobin drop   Acute on chronic blood loss anemia Anemia of chronic kidney disease Chronic anticoagulation with Eliquis History of  rectal bleeding 01/2019 with unremarkable EGD and colonoscopy baseline hemoglobin around 8.0 s/p 3u pRBC EGD, unremarkable Plan: Holding Eliquis (taking for PE since August 2023) re-attempt bowel prep yesterday and today, Miralax powder 34 g q2h x 5 doses to be added to all her clear liquid today as her bowel prep.   Pending colonoscopy today w/ Dr Virgina Jock   Chronic anticoagulation History of PE 03/20/2022 S/p partial mastectomy for left breast cancer 03/04/2022 Hold Eliquis in the setting of suspected GI bleeding   Chronic diastolic CHF (congestive heart failure) ([place]) Clinically euvolemic cont home torsemide   ESRD on hemodialysis [facility]) Nephrology following Plan schedule her dialysis around the colonoscopy (since both can't be done on the same day).   CAD (coronary artery disease) Continue aspirin and atorvastatin   Essential hypertension Continue metoprolol and torsemide cont home losartan nightly   COPD (chronic obstructive pulmonary disease) (HCC) Chronic respiratory failure on 4L O2 Not currently exacerbated Continue home inhalers and baseline oxygen   Depression Continue sertraline and trazodone   Seizure disorder (HCC) Continue Keppra   Breast cancer, left S/P partial mastectomy 03/04/2022 Not followed by oncology.  No oncology follow up scheduled currently - her oncology team reached out for follow up but family declined. Patient's lumpectomy was complicated by abscess and staph bacteremia Patient says she does not think she wants to pursue any further treatment   Insulin-requiring or dependent type II diabetes mellitus ([place]) recent A1c 7.0 BG checks without SSI since BG have all been <150 and pt undergoing GI procedure and preps.   History of CVA (cerebrovascular accident) Continue aspirin and atorvastatin  DVT prophylaxis: SCD/compression hose  Pertinent IV fluids/nutrition: none Central lines / invasive devices: none  Code Status: FULL CODE  Current  Admission Status: inpatient  Med/Surg  TOC needs / Dispo plan: anticipate d/c home to previous environment Barriers to discharge / significant pending items: pending colonoscopy              Subjective / Brief ROS:  Patient reports abdomen hurts in the middle, cramping type pain,  Denies CP/SOB.  Denies new weakness.  Reports no concerns w/ urination/defecation.   Family Communication: none at this time     Objective Findings:  Vitals:   08/30/22 2133 08/31/22 0554 08/31/22 0752 08/31/22 1202  BP: (!) 159/71 (!) 153/63 (!) 149/59 (!) 158/62  Pulse: 70 67 68 72  Resp: '19 20 16 17  '$ Temp: 99.3 F (37.4 C) 98.4 F (36.9 C) 98.6 F (37 C) 98.4 F (36.9 C)  TempSrc:      SpO2: 96% 90% 90% 91%  Weight:      Height:       No intake or output data in the 24 hours ending 08/31/22 1311 Filed Weights   08/26/22 2344 08/29/22 1510 08/29/22 1928  Weight: 104.1 kg 106.5 kg 105.1 kg    Examination:  Physical Exam Constitutional:      General: She is not in acute distress.    Appearance: Normal appearance. She is obese. She is not ill-appearing.  Cardiovascular:     Rate and Rhythm: Normal rate and regular rhythm.     Heart sounds: Normal heart sounds.  Pulmonary:     Effort: Pulmonary effort is normal.     Breath sounds: Normal breath sounds.  Abdominal:     General: There is no distension.     Palpations: Abdomen is soft.     Tenderness: There is abdominal tenderness (periumbilical area). There is no guarding or rebound.  Musculoskeletal:     Right lower leg: No edema.     Left lower leg: No edema.  Skin:    General: Skin is warm and dry.  Neurological:     General: No focal deficit present.     Mental Status: She is alert. Mental status is at baseline.  Psychiatric:        Mood and Affect: Mood normal.        Behavior: Behavior normal.          Scheduled Medications:   sodium chloride   Intravenous Once   sodium chloride   Intravenous Once    sodium chloride   Intravenous Once   aspirin EC  81 mg Oral Daily   atorvastatin  80 mg Oral QPM   Chlorhexidine Gluconate Cloth  6 each Topical Q0600   cinacalcet  30 mg Oral Q breakfast   epoetin (EPOGEN/PROCRIT) injection  4,000 Units Intravenous Q M,W,F-HD   gabapentin  300 mg Oral BID   Ipratropium-Albuterol  1 puff Inhalation Q6H   levETIRAcetam  1,000 mg Oral Daily   levETIRAcetam  250 mg Oral Once per day on Mon Wed Fri   losartan  100 mg Oral QHS   metoprolol succinate  100 mg Oral Daily   pantoprazole  40 mg Oral Daily   polyethylene glycol  34 g Oral Q2H   sertraline  25 mg Oral Daily   torsemide  100 mg Oral Daily   traZODone  50 mg Oral QHS    Continuous Infusions:  sodium chloride     anticoagulant sodium citrate  PRN Medications:  acetaminophen **OR** acetaminophen, albuterol, alteplase, anticoagulant sodium citrate, heparin, HYDROcodone-acetaminophen, lidocaine (PF), lidocaine-prilocaine, pentafluoroprop-tetrafluoroeth, polyethylene glycol-electrolytes  Antimicrobials from admission:  Anti-infectives (From admission, onward)    None           Data Reviewed:  I have personally reviewed the following...  CBC: Recent Labs  Lab 08/26/22 1758 08/27/22 0031 08/28/22 0827 08/28/22 1103 08/29/22 0506 08/30/22 0456 08/31/22 0413  WBC 8.0  --   --   --  7.7 9.4 8.5  HGB 6.2*   < > 7.8* 7.3* 7.0* 7.7* 7.5*  HCT 20.8*  --  23.0* 23.3* 22.4* 24.1* 24.0*  MCV 85.2  --   --   --  84.2 84.3 85.4  PLT 220  --   --   --  159 156 175   < > = values in this interval not displayed.   Basic Metabolic Panel: Recent Labs  Lab 08/26/22 1758 08/28/22 0827 08/29/22 0506 08/30/22 0456 08/31/22 0413  NA 139 138 137 137 138  K 3.9 4.6 4.8 3.8 4.3  CL 103 103 102 98 102  CO2 27  --  21* 27 26  GLUCOSE 149* 103* 110* 149* 104*  BUN 27* 38* 62* 35* 43*  CREATININE 5.48* 9.10* 9.05* 6.03* 7.53*  CALCIUM 8.5*  --  8.2* 7.8* 8.3*  MG  --   --  1.8 1.8 1.8   PHOS  --   --  5.5*  --   --    GFR: Estimated Creatinine Clearance: 8.8 mL/min (A) (by C-G formula based on SCr of 7.53 mg/dL (H)). Liver Function Tests: Recent Labs  Lab 08/26/22 1758  AST 13*  ALT 7  ALKPHOS 89  BILITOT 0.6  PROT 7.1  ALBUMIN 3.1*   No results for input(s): "LIPASE", "AMYLASE" in the last 168 hours. No results for input(s): "AMMONIA" in the last 168 hours. Coagulation Profile: No results for input(s): "INR", "PROTIME" in the last 168 hours. Cardiac Enzymes: No results for input(s): "CKTOTAL", "CKMB", "CKMBINDEX", "TROPONINI" in the last 168 hours. BNP (last 3 results) No results for input(s): "PROBNP" in the last 8760 hours. HbA1C: No results for input(s): "HGBA1C" in the last 72 hours. CBG: Recent Labs  Lab 08/30/22 1220 08/30/22 1640 08/30/22 2135 08/31/22 0748 08/31/22 1159  GLUCAP 134* 139* 146* 113* 125*   Lipid Profile: No results for input(s): "CHOL", "HDL", "LDLCALC", "TRIG", "CHOLHDL", "LDLDIRECT" in the last 72 hours. Thyroid Function Tests: No results for input(s): "TSH", "T4TOTAL", "FREET4", "T3FREE", "THYROIDAB" in the last 72 hours. Anemia Panel: No results for input(s): "VITAMINB12", "FOLATE", "FERRITIN", "TIBC", "IRON", "RETICCTPCT" in the last 72 hours.  Most Recent Urinalysis On File:     Component Value Date/Time   COLORURINE YELLOW (A) 03/20/2022 0125   APPEARANCEUR CLEAR (A) 03/20/2022 0125   APPEARANCEUR Clear 06/14/2014 1510   LABSPEC 1.010 03/20/2022 0125   LABSPEC 1.006 06/14/2014 1510   PHURINE 7.0 03/20/2022 0125   GLUCOSEU 50 (A) 03/20/2022 0125   GLUCOSEU >=500 06/14/2014 1510   HGBUR MODERATE (A) 03/20/2022 0125   BILIRUBINUR NEGATIVE 03/20/2022 0125   BILIRUBINUR Negative 06/14/2014 1510   KETONESUR NEGATIVE 03/20/2022 0125   PROTEINUR 100 (A) 03/20/2022 0125   NITRITE NEGATIVE 03/20/2022 0125   LEUKOCYTESUR NEGATIVE 03/20/2022 0125   LEUKOCYTESUR Negative 06/14/2014 1510   Sepsis  Labs: '@LABRCNTIP'$ (procalcitonin:4,lacticidven:4) Microbiology: Recent Results (from the past 240 hour(s))  MRSA Next Gen by PCR, Nasal     Status: None   Collection Time: 08/28/22  4:20 PM   Specimen: Nasal Mucosa; Nasal Swab  Result Value Ref Range Status   MRSA by PCR Next Gen NOT DETECTED NOT DETECTED Final    Comment: (NOTE) The GeneXpert MRSA Assay (FDA approved for NASAL specimens only), is one component of a comprehensive MRSA colonization surveillance program. It is not intended to diagnose MRSA infection nor to guide or monitor treatment for MRSA infections. Test performance is not FDA approved in patients less than 35 years old. Performed at [facility], [address] [place], [place] [address]   Gastrointestinal Panel by PCR , Stool     Status: None   Collection Time: 08/29/22  3:44 PM   Specimen: Stool  Result Value Ref Range Status   Campylobacter species NOT DETECTED NOT DETECTED Final   Plesimonas shigelloides NOT DETECTED NOT DETECTED Final   Salmonella species NOT DETECTED NOT DETECTED Final   Yersinia enterocolitica NOT DETECTED NOT DETECTED Final   Vibrio species NOT DETECTED NOT DETECTED Final   Vibrio cholerae NOT DETECTED NOT DETECTED Final   Enteroaggregative E coli (EAEC) NOT DETECTED NOT DETECTED Final   Enteropathogenic E coli (EPEC) NOT DETECTED NOT DETECTED Final   Enterotoxigenic E coli (ETEC) NOT DETECTED NOT DETECTED Final   Shiga like toxin producing E coli (STEC) NOT DETECTED NOT DETECTED Final   Shigella/Enteroinvasive E coli (EIEC) NOT DETECTED NOT DETECTED Final   Cryptosporidium NOT DETECTED NOT DETECTED Final   Cyclospora cayetanensis NOT DETECTED NOT DETECTED Final   Entamoeba histolytica NOT DETECTED NOT DETECTED Final   Giardia lamblia NOT DETECTED NOT DETECTED Final   Adenovirus F40/41 NOT DETECTED NOT DETECTED Final   Astrovirus NOT DETECTED NOT DETECTED Final   Norovirus GI/GII NOT DETECTED NOT DETECTED Final   Rotavirus  A NOT DETECTED NOT DETECTED Final   Sapovirus (I, II, IV, and V) NOT DETECTED NOT DETECTED Final    Comment: Performed at [facility], [address], [place], [place] [address]      Radiology Studies last 3 days: No results found.           LOS: 3 days      Emeterio Reeve, DO [facility] 08/31/2022, 1:11 PM    Dictation software may have been used to generate the above note. Typos may occur and escape review in typed/dictated notes. Please contact Dr Sheppard Coil directly for clarity if needed.  Staff may message me via secure chat in [place]  but this may not receive an immediate response,  please page me for urgent matters!  If 7PM-7AM, please contact night coverage [email or web address]

## 2022-09-01 ENCOUNTER — Encounter: Payer: Self-pay | Admitting: Gastroenterology

## 2022-09-01 DIAGNOSIS — D62 Acute posthemorrhagic anemia: Secondary | ICD-10-CM | POA: Diagnosis not present

## 2022-09-01 LAB — GLUCOSE, CAPILLARY
Glucose-Capillary: 90 mg/dL (ref 70–99)
Glucose-Capillary: 108 mg/dL — ABNORMAL HIGH (ref 70–99)
Glucose-Capillary: 147 mg/dL — ABNORMAL HIGH (ref 70–99)

## 2022-09-01 LAB — BASIC METABOLIC PANEL
Anion gap: 10 (ref 5–15)
BUN: 53 mg/dL — ABNORMAL HIGH (ref 8–23)
CO2: 26 mmol/L (ref 22–32)
Calcium: 8.2 mg/dL — ABNORMAL LOW (ref 8.9–10.3)
Chloride: 99 mmol/L (ref 98–111)
Creatinine, Ser: 9.13 mg/dL — ABNORMAL HIGH (ref 0.44–1.00)
GFR, Estimated: 4 mL/min — ABNORMAL LOW (ref >60)
Glucose, Bld: 104 mg/dL — ABNORMAL HIGH (ref 70–99)
Potassium: 4.8 mmol/L (ref 3.5–5.1)
Sodium: 135 mmol/L (ref 135–145)

## 2022-09-01 LAB — CBC
HCT: 22.4 % — ABNORMAL LOW (ref 36.0–46.0)
Hemoglobin: 7 g/dL — ABNORMAL LOW (ref 12.0–15.0)
MCH: 26.7 pg (ref 26.0–34.0)
MCHC: 31.3 g/dL (ref 30.0–36.0)
MCV: 85.5 fL (ref 80.0–100.0)
Platelets: 167 10*3/uL (ref 150–400)
RBC: 2.62 MIL/uL — ABNORMAL LOW (ref 3.87–5.11)
RDW: 16.6 % — ABNORMAL HIGH (ref 11.5–15.5)
WBC: 7.9 10*3/uL (ref 4.0–10.5)
nRBC: 0 % (ref 0.0–0.2)

## 2022-09-01 LAB — PREPARE RBC (CROSSMATCH)

## 2022-09-01 LAB — MAGNESIUM: Magnesium: 1.7 mg/dL (ref 1.7–2.4)

## 2022-09-01 MED ORDER — SODIUM CHLORIDE 0.9% IV SOLUTION: Freq: Once | INTRAVENOUS | Status: DC

## 2022-09-01 NOTE — Progress Notes (Signed)
PROGRESS NOTE    Marnette Perkins   ZES:[id number] DOB: [birth year]  DOA: 08/26/2022 Date of Service: 09/01/22 PCP: Lowella Bandy, MD     Brief Narrative / Hospital Course:  Austin Belenda Alviar is a 72 y.o. female with medical history significant for ESRD on HD MWF, insulin requiring T2DM, COPD on home 4 L home O2, CAD, carotid stenosis, seizure disorder, HTN, prior CVA, GERD, GI bleeding 2020 with unremarkable EGD and colonoscopy, left breast ca s/p partial mastectomy and left axillary dissection on 2/63/3354 complicated by left breast abscess with coag negative staph bacteremia as well as small bilateral PE 03/20/2022, currently on Eliquis, who was sent from dialysis to the ED 08/26/2022 for evaluation of hemoglobin of 5.7, down from baseline of around 7.6-8.3.  02/02 (+)guaiac stool in ED.  Transfused 3 units PRBC.  EGD on 02/04 no concerns. Continued on po protonix.  colonoscopy 02/07 (had been delayed d/t weekend and to dialysis schedule) (+)BRB, diverticular disease, no active bleed noted. GI suspect she had diverticular bleed. 09/01/22 Hgb 7.0, receiving another unit PRBC at HD.   Consultants:  Nephrology Gastroenterology   Procedures: EGD 08/28/22 Colonoscopy 08/31/22      ASSESSMENT & PLAN:   Principal Problem:   Acute on chronic blood loss anemia Active Problems:   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-stage renal disease) (HCC)   History of lower GI bleeding   Chronic anticoagulation   History of pulmonary embolism 03/20/2022   ESRD on hemodialysis (HCC)   Chronic diastolic CHF (congestive heart failure) (HCC)   CAD (coronary artery disease)   Essential hypertension   COPD (chronic obstructive pulmonary disease) (HCC)   Chronic respiratory failure with hypoxia (HCC)   Depression   Seizure disorder (HCC)   History of CVA (cerebrovascular accident)   Insulin-requiring or dependent type II diabetes mellitus ([place])   Breast cancer, left S/P partial  mastectomy,03/04/2022   Hemoglobin drop   Acute on chronic blood loss anemia Anemia of chronic kidney disease Chronic anticoagulation with Eliquis History of rectal bleeding 01/2019 with unremarkable EGD and colonoscopy baseline hemoglobin around 8.0 s/p 3u pRBC EGD, unremarkable Colonoscopy, diverticular disease, BRB, likely diverticular bleed  Plan: Holding Eliquis (taking for PE since August 2023) 1 unit PRBC today at HD [facility] AM, if CBC stable goal for dc   Chronic anticoagulation History of PE 03/20/2022 S/p partial mastectomy for left breast cancer 03/04/2022 Hold Eliquis in the setting of GI bleeding   Chronic diastolic CHF (congestive heart failure) (HCC) Clinically euvolemic cont home torsemide   ESRD on hemodialysis [facility]) Nephrology following Dialysis    CAD (coronary artery disease) Continue aspirin and atorvastatin   Essential hypertension Continue metoprolol and torsemide cont home losartan nightly   COPD (chronic obstructive pulmonary disease) ([place]) Chronic respiratory failure on 4L O2 Not currently exacerbated Continue home inhalers and baseline oxygen   Depression Continue sertraline and trazodone   Seizure disorder ([place]) Continue Keppra   Breast cancer, left S/P partial mastectomy 03/04/2022 Not followed by oncology.  No oncology follow up scheduled currently - her oncology team reached out for follow up but family declined. Patient's lumpectomy was complicated by abscess and staph bacteremia Patient says she does not think she wants to pursue any further treatment   Insulin-requiring or dependent type II diabetes mellitus ([place]) recent A1c 7.0 BG checks without SSI since BG have all been <150 and pt undergoing GI procedure and preps.   History of CVA (cerebrovascular accident) Continue aspirin and atorvastatin  DVT prophylaxis: SCD/compression hose  Pertinent IV fluids/nutrition: none Central lines / invasive devices: none  Code Status:  FULL CODE  Current Admission Status: inpatient  Med/Surg  TOC needs / Dispo plan: anticipate d/c home to previous environment Barriers to discharge / significant pending items: pending colonoscopy              Subjective / Brief ROS:  Patient reports abdomen hurts in the middle, cramping type pain, about same as yesterday  Denies CP/SOB.  Denies new weakness.  Reports no concerns w/ urination/defecation.   Family Communication: spoke w/ daughter, we discussed holding eliquis long term unless new clot, she consented for PRBC as well     Objective Findings:  Vitals:   09/01/22 1400 09/01/22 1417 09/01/22 1420 09/01/22 1447  BP: (!) 187/72 (!) 182/73  (!) 183/68  Pulse: 66 68  69  Resp: '17 16  16  '$ Temp:  98.3 F (36.8 C)  98.9 F (37.2 C)  TempSrc:  Oral  Oral  SpO2: 99% 99%  100%  Weight:   105.7 kg   Height:        Intake/Output Summary (Last 24 hours) at 09/01/2022 1528 Last data filed at 09/01/2022 1417 Gross per 24 hour  Intake 1240 ml  Output 0 ml  Net 1240 ml   Filed Weights   08/29/22 1510 08/29/22 1928 09/01/22 1420  Weight: 106.5 kg 105.1 kg 105.7 kg    Examination:  Physical Exam Constitutional:      General: She is not in acute distress.    Appearance: Normal appearance. She is obese. She is not ill-appearing.  Cardiovascular:     Rate and Rhythm: Normal rate and regular rhythm.     Heart sounds: Normal heart sounds.  Pulmonary:     Effort: Pulmonary effort is normal.     Breath sounds: Normal breath sounds.  Abdominal:     General: There is no distension.     Palpations: Abdomen is soft.     Tenderness: There is abdominal tenderness (periumbilical area). There is no guarding or rebound.  Musculoskeletal:     Right lower leg: No edema.     Left lower leg: No edema.  Skin:    General: Skin is warm and dry.  Neurological:     General: No focal deficit present.     Mental Status: She is alert. Mental status is at baseline.  Psychiatric:         Mood and Affect: Mood normal.        Behavior: Behavior normal.          Scheduled Medications:   sodium chloride   Intravenous Once   sodium chloride   Intravenous Once   sodium chloride   Intravenous Once   aspirin EC  81 mg Oral Daily   atorvastatin  80 mg Oral QPM   Chlorhexidine Gluconate Cloth  6 each Topical Q0600   cinacalcet  30 mg Oral Q breakfast   epoetin (EPOGEN/PROCRIT) injection  4,000 Units Intravenous Q M,W,F-HD   gabapentin  300 mg Oral BID   Ipratropium-Albuterol  1 puff Inhalation Q6H   levETIRAcetam  1,000 mg Oral Daily   levETIRAcetam  250 mg Oral Once per day on Mon Wed Fri   losartan  100 mg Oral QHS   metoprolol succinate  100 mg Oral Daily   pantoprazole  40 mg Oral Daily   sertraline  25 mg Oral Daily   torsemide  100 mg Oral Daily  traZODone  50 mg Oral QHS    Continuous Infusions:  anticoagulant sodium citrate      PRN Medications:  acetaminophen **OR** acetaminophen, albuterol, alteplase, anticoagulant sodium citrate, heparin, HYDROcodone-acetaminophen, lidocaine (PF), lidocaine-prilocaine, pentafluoroprop-tetrafluoroeth, polyethylene glycol-electrolytes  Antimicrobials from admission:  Anti-infectives (From admission, onward)    None           Data Reviewed:  I have personally reviewed the following...  CBC: Recent Labs  Lab 08/26/22 1758 08/27/22 0031 08/28/22 1103 08/29/22 0506 08/30/22 0456 08/31/22 0413 09/01/22 0353  WBC 8.0  --   --  7.7 9.4 8.5 7.9  HGB 6.2*   < > 7.3* 7.0* 7.7* 7.5* 7.0*  HCT 20.8*   < > 23.3* 22.4* 24.1* 24.0* 22.4*  MCV 85.2  --   --  84.2 84.3 85.4 85.5  PLT 220  --   --  159 156 175 167   < > = values in this interval not displayed.   Basic Metabolic Panel: Recent Labs  Lab 08/26/22 1758 08/28/22 0827 08/29/22 0506 08/30/22 0456 08/31/22 0413 09/01/22 0353  NA 139 138 137 137 138 135  K 3.9 4.6 4.8 3.8 4.3 4.8  CL 103 103 102 98 102 99  CO2 27  --  21* '27 26 26   '$ GLUCOSE 149* 103* 110* 149* 104* 104*  BUN 27* 38* 62* 35* 43* 53*  CREATININE 5.48* 9.10* 9.05* 6.03* 7.53* 9.13*  CALCIUM 8.5*  --  8.2* 7.8* 8.3* 8.2*  MG  --   --  1.8 1.8 1.8 1.7  PHOS  --   --  5.5*  --   --   --    GFR: Estimated Creatinine Clearance: 7.3 mL/min (A) (by C-G formula based on SCr of 9.13 mg/dL (H)). Liver Function Tests: Recent Labs  Lab 08/26/22 1758  AST 13*  ALT 7  ALKPHOS 89  BILITOT 0.6  PROT 7.1  ALBUMIN 3.1*   No results for input(s): "LIPASE", "AMYLASE" in the last 168 hours. No results for input(s): "AMMONIA" in the last 168 hours. Coagulation Profile: No results for input(s): "INR", "PROTIME" in the last 168 hours. Cardiac Enzymes: No results for input(s): "CKTOTAL", "CKMB", "CKMBINDEX", "TROPONINI" in the last 168 hours. BNP (last 3 results) No results for input(s): "PROBNP" in the last 8760 hours. HbA1C: No results for input(s): "HGBA1C" in the last 72 hours. CBG: Recent Labs  Lab 08/31/22 0748 08/31/22 1159 08/31/22 1732 08/31/22 2101 09/01/22 0831  GLUCAP 113* 125* 96 101* 90   Lipid Profile: No results for input(s): "CHOL", "HDL", "LDLCALC", "TRIG", "CHOLHDL", "LDLDIRECT" in the last 72 hours. Thyroid Function Tests: No results for input(s): "TSH", "T4TOTAL", "FREET4", "T3FREE", "THYROIDAB" in the last 72 hours. Anemia Panel: No results for input(s): "VITAMINB12", "FOLATE", "FERRITIN", "TIBC", "IRON", "RETICCTPCT" in the last 72 hours.  Most Recent Urinalysis On File:     Component Value Date/Time   COLORURINE YELLOW (A) 03/20/2022 0125   APPEARANCEUR CLEAR (A) 03/20/2022 0125   APPEARANCEUR Clear 06/14/2014 1510   LABSPEC 1.010 03/20/2022 0125   LABSPEC 1.006 06/14/2014 1510   PHURINE 7.0 03/20/2022 0125   GLUCOSEU 50 (A) 03/20/2022 0125   GLUCOSEU >=500 06/14/2014 1510   HGBUR MODERATE (A) 03/20/2022 0125   BILIRUBINUR NEGATIVE 03/20/2022 0125   BILIRUBINUR Negative 06/14/2014 1510   KETONESUR NEGATIVE 03/20/2022  0125   PROTEINUR 100 (A) 03/20/2022 0125   NITRITE NEGATIVE 03/20/2022 0125   LEUKOCYTESUR NEGATIVE 03/20/2022 0125   LEUKOCYTESUR Negative 06/14/2014 1510   Sepsis  Labs: '@LABRCNTIP'$ (procalcitonin:4,lacticidven:4) Microbiology: Recent Results (from the past 240 hour(s))  MRSA Next Gen by PCR, Nasal     Status: None   Collection Time: 08/28/22  4:20 PM   Specimen: Nasal Mucosa; Nasal Swab  Result Value Ref Range Status   MRSA by PCR Next Gen NOT DETECTED NOT DETECTED Final    Comment: (NOTE) The GeneXpert MRSA Assay (FDA approved for NASAL specimens only), is one component of a comprehensive MRSA colonization surveillance program. It is not intended to diagnose MRSA infection nor to guide or monitor treatment for MRSA infections. Test performance is not FDA approved in patients less than 62 years old. Performed at [facility], [address] [place], [place] [address]   Gastrointestinal Panel by PCR , Stool     Status: None   Collection Time: 08/29/22  3:44 PM   Specimen: Stool  Result Value Ref Range Status   Campylobacter species NOT DETECTED NOT DETECTED Final   Plesimonas shigelloides NOT DETECTED NOT DETECTED Final   Salmonella species NOT DETECTED NOT DETECTED Final   Yersinia enterocolitica NOT DETECTED NOT DETECTED Final   Vibrio species NOT DETECTED NOT DETECTED Final   Vibrio cholerae NOT DETECTED NOT DETECTED Final   Enteroaggregative E coli (EAEC) NOT DETECTED NOT DETECTED Final   Enteropathogenic E coli (EPEC) NOT DETECTED NOT DETECTED Final   Enterotoxigenic E coli (ETEC) NOT DETECTED NOT DETECTED Final   Shiga like toxin producing E coli (STEC) NOT DETECTED NOT DETECTED Final   Shigella/Enteroinvasive E coli (EIEC) NOT DETECTED NOT DETECTED Final   Cryptosporidium NOT DETECTED NOT DETECTED Final   Cyclospora cayetanensis NOT DETECTED NOT DETECTED Final   Entamoeba histolytica NOT DETECTED NOT DETECTED Final   Giardia lamblia NOT DETECTED NOT DETECTED  Final   Adenovirus F40/41 NOT DETECTED NOT DETECTED Final   Astrovirus NOT DETECTED NOT DETECTED Final   Norovirus GI/GII NOT DETECTED NOT DETECTED Final   Rotavirus A NOT DETECTED NOT DETECTED Final   Sapovirus (I, II, IV, and V) NOT DETECTED NOT DETECTED Final    Comment: Performed at [facility], [address], [place], [place] [address]      Radiology Studies last 3 days: No results found.           LOS: 4 days      Emeterio Reeve, DO [facility] 09/01/2022, 3:28 PM    Dictation software may have been used to generate the above note. Typos may occur and escape review in typed/dictated notes. Please contact Dr Sheppard Coil directly for clarity if needed.  Staff may message me via secure chat in [place]  but this may not receive an immediate response,  please page me for urgent matters!  If 7PM-7AM, please contact night coverage [email or web address]

## 2022-09-01 NOTE — Progress Notes (Signed)
PT did 3.5 hrs of HD, tolerated well with no signs of distress 1 unit of blood given.  UF = 0     09/01/22 1417  Vitals  Temp 98.3 F (36.8 C)  Temp Source Oral  BP (!) 182/73  MAP (mmHg) 106  BP Location Right Arm  BP Method Automatic  Patient Position (if appropriate) Lying  Pulse Rate 68  Pulse Rate Source Monitor  ECG Heart Rate 69  Resp 16  Oxygen Therapy  SpO2 99 %  O2 Device Nasal Cannula  O2 Flow Rate (L/min) 2 L/min  Patient Activity (if Appropriate) In bed  Pulse Oximetry Type Continuous  Post Treatment  Dialyzer Clearance Lightly streaked  Duration of HD Treatment -hour(s) 3.5 hour(s)  Hemodialysis Intake (mL) 0 mL  Liters Processed 84  Fluid Removed (mL) 0 mL  Tolerated HD Treatment Yes  Post-Hemodialysis Comments tolerated well  AVG/AVF Arterial Site Held (minutes) 15 minutes  AVG/AVF Venous Site Held (minutes) 10 minutes  Fistula / Graft Left Forearm Arteriovenous fistula  No Placement Date or Time found.   Orientation: Left  Access Location: Forearm  Access Type: Arteriovenous fistula  Site Condition No complications  Fistula / Graft Assessment Present;Thrill;Bruit  Status Deaccessed  Drainage Description None

## 2022-09-01 NOTE — Progress Notes (Signed)
[facility]  ROUNDING NOTE   Subjective:   Cynthia Dean s is a 72 y.o. 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 Patient presents to ED from dialysis with abnormal labs, hgb 5.7. She has been admitted under observation for Chronic blood loss anemia [D50.0] Gastrointestinal hemorrhage, unspecified gastrointestinal hemorrhage type [K92.2] Anemia, unspecified type [D64.9] Hemoglobin drop [R71.0]  Patient is known to our practice and receives outpatient dialysis treatments at [facility] on a MWF schedule, supervised by Dr. Holley Raring.    Patient seen resting in bed Daughter at bedside Continues to complain of RLQ pain Cough remains present   Objective:  Vital signs in last 24 hours:  Temp:  [97.3 F (36.3 C)-98.6 F (37 C)] 98.2 F (36.8 C) (02/08 1022) Pulse Rate:  [65-74] 66 (02/08 1100) Resp:  [14-22] 16 (02/08 1100) BP: (97-165)/(43-68) 153/65 (02/08 1100) SpO2:  [84 %-100 %] 97 % (02/08 1100)  Weight change:  Filed Weights   08/26/22 2344 08/29/22 1510 08/29/22 1928  Weight: 104.1 kg 106.5 kg 105.1 kg    Intake/Output: I/O last 3 completed shifts: In: 1884 [P.O.:1420; I.V.:150] Out: -    Intake/Output this shift:  No intake/output data recorded.  Physical Exam: General: NAD  Head: Normocephalic, atraumatic. Moist oral mucosal membranes  Eyes: Anicteric  Lungs:  Clear to auscultation, normal effort  Heart: Regular rate and rhythm  Abdomen:  Soft, nontender  Extremities:  No peripheral edema.  Neurologic: Alert and oriented, moving all four extremities  Skin: No lesions  Access: Lt AVG    Basic Metabolic Panel: Recent Labs  Lab 08/26/22 1758 08/28/22 0827 08/29/22 0506 08/30/22 0456 08/31/22 0413 09/01/22 0353  NA 139 138 137 137 138 135  K 3.9 4.6 4.8 3.8 4.3 4.8  CL 103 103 102 98 102 99   CO2 27  --  21* '27 26 26  '$ GLUCOSE 149* 103* 110* 149* 104* 104*  BUN 27* 38* 62* 35* 43* 53*  CREATININE 5.48* 9.10* 9.05* 6.03* 7.53* 9.13*  CALCIUM 8.5*  --  8.2* 7.8* 8.3* 8.2*  MG  --   --  1.8 1.8 1.8 1.7  PHOS  --   --  5.5*  --   --   --      Liver Function Tests: Recent Labs  Lab 08/26/22 1758  AST 13*  ALT 7  ALKPHOS 89  BILITOT 0.6  PROT 7.1  ALBUMIN 3.1*    No results for input(s): "LIPASE", "AMYLASE" in the last 168 hours. No results for input(s): "AMMONIA" in the last 168 hours.  CBC: Recent Labs  Lab 08/26/22 1758 08/27/22 0031 08/28/22 1103 08/29/22 0506 08/30/22 0456 08/31/22 0413 09/01/22 0353  WBC 8.0  --   --  7.7 9.4 8.5 7.9  HGB 6.2*   < > 7.3* 7.0* 7.7* 7.5* 7.0*  HCT 20.8*   < > 23.3* 22.4* 24.1* 24.0* 22.4*  MCV 85.2  --   --  84.2 84.3 85.4 85.5  PLT 220  --   --  159 156 175 167   < > = values in this interval not displayed.     Cardiac Enzymes: No results for input(s): "CKTOTAL", "CKMB", "CKMBINDEX", "TROPONINI" in the last 168 hours.  BNP: Invalid input(s): "POCBNP"  CBG: Recent Labs  Lab 08/31/22 0748 08/31/22 1159 08/31/22 1732 08/31/22 2101 09/01/22 0831  GLUCAP 113* 125* 96 101* 90  Microbiology: Results for orders placed or performed during the hospital encounter of 08/26/22  MRSA Next Gen by PCR, Nasal     Status: None   Collection Time: 08/28/22  4:20 PM   Specimen: Nasal Mucosa; Nasal Swab  Result Value Ref Range Status   MRSA by PCR Next Gen NOT DETECTED NOT DETECTED Final    Comment: (NOTE) The GeneXpert MRSA Assay (FDA approved for NASAL specimens only), is one component of a comprehensive MRSA colonization surveillance program. It is not intended to diagnose MRSA infection nor to guide or monitor treatment for MRSA infections. Test performance is not FDA approved in patients less than 23 years old. Performed at [facility], [address] [place], [place] [address]   Gastrointestinal  Panel by PCR , Stool     Status: None   Collection Time: 08/29/22  3:44 PM   Specimen: Stool  Result Value Ref Range Status   Campylobacter species NOT DETECTED NOT DETECTED Final   Plesimonas shigelloides NOT DETECTED NOT DETECTED Final   Salmonella species NOT DETECTED NOT DETECTED Final   Yersinia enterocolitica NOT DETECTED NOT DETECTED Final   Vibrio species NOT DETECTED NOT DETECTED Final   Vibrio cholerae NOT DETECTED NOT DETECTED Final   Enteroaggregative E coli (EAEC) NOT DETECTED NOT DETECTED Final   Enteropathogenic E coli (EPEC) NOT DETECTED NOT DETECTED Final   Enterotoxigenic E coli (ETEC) NOT DETECTED NOT DETECTED Final   Shiga like toxin producing E coli (STEC) NOT DETECTED NOT DETECTED Final   Shigella/Enteroinvasive E coli (EIEC) NOT DETECTED NOT DETECTED Final   Cryptosporidium NOT DETECTED NOT DETECTED Final   Cyclospora cayetanensis NOT DETECTED NOT DETECTED Final   Entamoeba histolytica NOT DETECTED NOT DETECTED Final   Giardia lamblia NOT DETECTED NOT DETECTED Final   Adenovirus F40/41 NOT DETECTED NOT DETECTED Final   Astrovirus NOT DETECTED NOT DETECTED Final   Norovirus GI/GII NOT DETECTED NOT DETECTED Final   Rotavirus A NOT DETECTED NOT DETECTED Final   Sapovirus (I, II, IV, and V) NOT DETECTED NOT DETECTED Final    Comment: Performed at [facility], [address] [place], [place] [address]    Coagulation Studies: No results for input(s): "LABPROT", "INR" in the last 72 hours.  Urinalysis: No results for input(s): "COLORURINE", "LABSPEC", "PHURINE", "GLUCOSEU", "HGBUR", "BILIRUBINUR", "KETONESUR", "PROTEINUR", "UROBILINOGEN", "NITRITE", "LEUKOCYTESUR" in the last 72 hours.  Invalid input(s): "APPERANCEUR"    Imaging: No results found.   Medications:    anticoagulant sodium citrate      sodium chloride   Intravenous Once   sodium chloride   Intravenous Once   sodium chloride   Intravenous Once   aspirin EC  81 mg Oral Daily    atorvastatin  80 mg Oral QPM   Chlorhexidine Gluconate Cloth  6 each Topical Q0600   cinacalcet  30 mg Oral Q breakfast   epoetin (EPOGEN/PROCRIT) injection  4,000 Units Intravenous Q M,W,F-HD   gabapentin  300 mg Oral BID   Ipratropium-Albuterol  1 puff Inhalation Q6H   levETIRAcetam  1,000 mg Oral Daily   levETIRAcetam  250 mg Oral Once per day on Mon Wed Fri   losartan  100 mg Oral QHS   metoprolol succinate  100 mg Oral Daily   pantoprazole  40 mg Oral Daily   sertraline  25 mg Oral Daily   torsemide  100 mg Oral Daily   traZODone  50 mg Oral QHS   acetaminophen **OR** acetaminophen, albuterol, alteplase, anticoagulant sodium citrate,  heparin, HYDROcodone-acetaminophen, lidocaine (PF), lidocaine-prilocaine, pentafluoroprop-tetrafluoroeth, polyethylene glycol-electrolytes  Assessment/ Plan:  Ms. Nitara Szczerba is a 72 y.o.  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 Patient presents to ED from dialysis with abnormal labs, hgb 5.7. She has been admitted under observation for Chronic blood loss anemia [D50.0] Gastrointestinal hemorrhage, unspecified gastrointestinal hemorrhage type [K92.2] Anemia, unspecified type [D64.9] Hemoglobin drop [R71.0]  [facility] [place]/MWF/Lt AVG   1. End stage renal disease on hemodialysis. Patient receiving dialysis today due to procedure yesterday. Will need treatment again tomorrow to maintain outpatient schedule.   2. Anemia of chronic kidney disease with acute blood loss Lab Results  Component Value Date   HGB 7.0 (L) 09/01/2022   Hgb on admission 5.7 with positive guaiac. Patient has received 3 unit blood transfusion during this admission. GI consulted and EGD on 08/28/22, no signs of active bleeding. Colonoscopy on 08/31/22 negative for active bleeding, blood clots and pooling noted in colon, believed to be  diverticular in nature. Will receive 1 unit of blood with dialysis today.   3. Hypertension with chronic kidney disease. Home regimen includes losartan, metoprolol, and torsemide. Currently receiving these medications.  Blood pressure  stable for this patient.   4. Secondary Hyperparathyroidism: with outpatient labs: PTH 613, phosphorus 4.8, calcium 9.0 on 03/07/22.    :  Lab Results  Component Value Date   PTH 131 (H) 03/14/2018   CALCIUM 8.2 (L) 09/01/2022   CAION 1.09 (L) 08/28/2022   PHOS 5.5 (H) 08/29/2022    Calcium and phosphorus within desired goal.  Continue daily cinacalcet.    LOS: [place] 2/8/202411:33 AM

## 2022-09-02 DIAGNOSIS — D62 Acute posthemorrhagic anemia: Secondary | ICD-10-CM | POA: Diagnosis not present

## 2022-09-02 LAB — BASIC METABOLIC PANEL
Anion gap: 8 (ref 5–15)
BUN: 44 mg/dL — ABNORMAL HIGH (ref 8–23)
CO2: 27 mmol/L (ref 22–32)
Calcium: 8 mg/dL — ABNORMAL LOW (ref 8.9–10.3)
Chloride: 98 mmol/L (ref 98–111)
Creatinine, Ser: 7.43 mg/dL — ABNORMAL HIGH (ref 0.44–1.00)
GFR, Estimated: 5 mL/min — ABNORMAL LOW (ref >60)
Glucose, Bld: 151 mg/dL — ABNORMAL HIGH (ref 70–99)
Potassium: 4.3 mmol/L (ref 3.5–5.1)
Sodium: 133 mmol/L — ABNORMAL LOW (ref 135–145)

## 2022-09-02 LAB — TYPE AND SCREEN
ABO/RH(D): A POS
Antibody Screen: NEGATIVE
Unit division: 0

## 2022-09-02 LAB — CBC
HCT: 24.6 % — ABNORMAL LOW (ref 36.0–46.0)
Hemoglobin: 7.9 g/dL — ABNORMAL LOW (ref 12.0–15.0)
MCH: 27.1 pg (ref 26.0–34.0)
MCHC: 32.1 g/dL (ref 30.0–36.0)
MCV: 84.5 fL (ref 80.0–100.0)
Platelets: 165 10*3/uL (ref 150–400)
RBC: 2.91 MIL/uL — ABNORMAL LOW (ref 3.87–5.11)
RDW: 16.1 % — ABNORMAL HIGH (ref 11.5–15.5)
WBC: 9.3 10*3/uL (ref 4.0–10.5)
nRBC: 0 % (ref 0.0–0.2)

## 2022-09-02 LAB — GLUCOSE, CAPILLARY
Glucose-Capillary: 142 mg/dL — ABNORMAL HIGH (ref 70–99)
Glucose-Capillary: 110 mg/dL — ABNORMAL HIGH (ref 70–99)
Glucose-Capillary: 157 mg/dL — ABNORMAL HIGH (ref 70–99)
Glucose-Capillary: 177 mg/dL — ABNORMAL HIGH (ref 70–99)

## 2022-09-02 LAB — MAGNESIUM: Magnesium: 1.7 mg/dL (ref 1.7–2.4)

## 2022-09-02 LAB — BPAM RBC
Blood Product Expiration Date: 202403092359
ISSUE DATE / TIME: 202402081119
Unit Type and Rh: 6200

## 2022-09-02 MED ORDER — EPOETIN ALFA 4000 UNIT/ML IJ SOLN
INTRAMUSCULAR | Status: AC
  Filled 2022-09-02: qty 1

## 2022-09-02 MED ORDER — MAGNESIUM SULFATE IN D5W 1-5 GM/100ML-% IV SOLN
1.0000 g | Freq: Once | INTRAVENOUS | Status: AC
  Administered 2022-09-02: 1 g via INTRAVENOUS
  Filled 2022-09-02: qty 100

## 2022-09-02 MED ORDER — ONDANSETRON 4 MG PO TBDP
8.0000 mg | ORAL_TABLET | Freq: Once | ORAL | Status: AC
  Administered 2022-09-02: 8 mg via ORAL
  Filled 2022-09-02: qty 2

## 2022-09-02 NOTE — TOC Transition Note (Signed)
Transition of Care [facility]) - CM/SW Discharge Note   Patient Details  Name: Cynthia Dean MRN: [id number] Date of Birth: [birth year]  [facility]) CM/SW Contact:  Gerilyn Pilgrim, LCSW Phone Number: 09/02/2022, 1:25 PM   Clinical Narrative:  Pt has orders to discharge. MD messaged to resume [place] orders for PT/OT/RN with [facility]. CSW signing off.           Patient Goals and CMS Choice      Discharge Placement                         Discharge Plan and Services Additional resources added to the After Visit Summary for                                       Social Determinants of Health (SDOH) Interventions SDOH Screenings   Food Insecurity: No Food Insecurity (08/27/2022)  Housing: Low Risk  (08/27/2022)  Transportation Needs: No Transportation Needs (08/27/2022)  Utilities: Not At Risk (08/27/2022)  Financial Resource Strain: Low Risk  (11/12/2017)  Physical Activity: Insufficiently Active (11/12/2017)  Social Connections: Moderately Integrated (11/12/2017)  Stress: No Stress Concern Present (11/12/2017)  Tobacco Use: Medium Risk (09/01/2022)     Readmission Risk Interventions     No data to display

## 2022-09-02 NOTE — Progress Notes (Signed)
[facility]  ROUNDING NOTE   Subjective:   Cynthia Dean s is a 72 y.o. 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 Patient presents to ED from dialysis with abnormal labs, hgb 5.7. She has been admitted under observation for Chronic blood loss anemia [D50.0] Gastrointestinal hemorrhage, unspecified gastrointestinal hemorrhage type [K92.2] Anemia, unspecified type [D64.9] Hemoglobin drop [R71.0]  Patient is known to our practice and receives outpatient dialysis treatments at [facility] on a MWF schedule, supervised by Dr. Holley Raring.    Patient seen and evaluated during dialysis   HEMODIALYSIS FLOWSHEET:  Blood Flow Rate (mL/min): 400 mL/min Arterial Pressure (mmHg): -180 mmHg Venous Pressure (mmHg): 200 mmHg TMP (mmHg): 5 mmHg Ultrafiltration Rate (mL/min): 344 mL/min Dialysate Flow Rate (mL/min): 300 ml/min Dialysis Fluid Bolus: Normal Saline Bolus Amount (mL): 300 mL  Resting comfortably during treatment No complaints at offer at this time   Objective:  Vital signs in last 24 hours:  Temp:  [97.8 F (36.6 C)-99.7 F (37.6 C)] 98.7 F (37.1 C) (02/09 0816) Pulse Rate:  [66-78] 71 (02/09 1100) Resp:  [15-23] 18 (02/09 1100) BP: (147-187)/(62-73) 165/62 (02/09 1100) SpO2:  [92 %-100 %] 99 % (02/09 1100) Weight:  [105 kg-105.7 kg] 105 kg (02/09 0816)  Weight change:  Filed Weights   08/29/22 1928 09/01/22 1420 09/02/22 0816  Weight: 105.1 kg 105.7 kg 105 kg    Intake/Output: I/O last 3 completed shifts: In: 670 [P.O.:280; I.V.:30; Blood:360] Out: 200 [Urine:200]   Intake/Output this shift:  No intake/output data recorded.  Physical Exam: General: NAD  Head: Normocephalic, atraumatic. Moist oral mucosal membranes  Eyes: Anicteric  Lungs:  Clear to auscultation, normal effort  Heart: Regular rate and  rhythm  Abdomen:  Soft, nontender  Extremities:  No peripheral edema.  Neurologic: Alert and oriented, moving all four extremities  Skin: No lesions  Access: Lt AVG    Basic Metabolic Panel: Recent Labs  Lab 08/29/22 0506 08/30/22 0456 08/31/22 0413 09/01/22 0353 09/02/22 0529  NA 137 137 138 135 133*  K 4.8 3.8 4.3 4.8 4.3  CL 102 98 102 99 98  CO2 21* 27 26 26 27  $ GLUCOSE 110* 149* 104* 104* 151*  BUN 62* 35* 43* 53* 44*  CREATININE 9.05* 6.03* 7.53* 9.13* 7.43*  CALCIUM 8.2* 7.8* 8.3* 8.2* 8.0*  MG 1.8 1.8 1.8 1.7 1.7  PHOS 5.5*  --   --   --   --      Liver Function Tests: Recent Labs  Lab 08/26/22 1758  AST 13*  ALT 7  ALKPHOS 89  BILITOT 0.6  PROT 7.1  ALBUMIN 3.1*    No results for input(s): "LIPASE", "AMYLASE" in the last 168 hours. No results for input(s): "AMMONIA" in the last 168 hours.  CBC: Recent Labs  Lab 08/29/22 0506 08/30/22 0456 08/31/22 0413 09/01/22 0353 09/02/22 0529  WBC 7.7 9.4 8.5 7.9 9.3  HGB 7.0* 7.7* 7.5* 7.0* 7.9*  HCT 22.4* 24.1* 24.0* 22.4* 24.6*  MCV 84.2 84.3 85.4 85.5 84.5  PLT 159 156 175 167 165     Cardiac Enzymes: No results for input(s): "CKTOTAL", "CKMB", "CKMBINDEX", "TROPONINI" in the last 168 hours.  BNP: Invalid input(s): "POCBNP"  CBG: Recent Labs  Lab 08/31/22 2101 09/01/22 0831 09/01/22 1611 09/01/22 2059 09/02/22 0741  GLUCAP 101* 90 108* 147* 142*     Microbiology: Results for orders placed or  performed during the hospital encounter of 08/26/22  MRSA Next Gen by PCR, Nasal     Status: None   Collection Time: 08/28/22  4:20 PM   Specimen: Nasal Mucosa; Nasal Swab  Result Value Ref Range Status   MRSA by PCR Next Gen NOT DETECTED NOT DETECTED Final    Comment: (NOTE) The GeneXpert MRSA Assay (FDA approved for NASAL specimens only), is one component of a comprehensive MRSA colonization surveillance program. It is not intended to diagnose MRSA infection nor to guide or monitor  treatment for MRSA infections. Test performance is not FDA approved in patients less than 70 years old. Performed at [facility], [address] [place], [place] [address]   Gastrointestinal Panel by PCR , Stool     Status: None   Collection Time: 08/29/22  3:44 PM   Specimen: Stool  Result Value Ref Range Status   Campylobacter species NOT DETECTED NOT DETECTED Final   Plesimonas shigelloides NOT DETECTED NOT DETECTED Final   Salmonella species NOT DETECTED NOT DETECTED Final   Yersinia enterocolitica NOT DETECTED NOT DETECTED Final   Vibrio species NOT DETECTED NOT DETECTED Final   Vibrio cholerae NOT DETECTED NOT DETECTED Final   Enteroaggregative E coli (EAEC) NOT DETECTED NOT DETECTED Final   Enteropathogenic E coli (EPEC) NOT DETECTED NOT DETECTED Final   Enterotoxigenic E coli (ETEC) NOT DETECTED NOT DETECTED Final   Shiga like toxin producing E coli (STEC) NOT DETECTED NOT DETECTED Final   Shigella/Enteroinvasive E coli (EIEC) NOT DETECTED NOT DETECTED Final   Cryptosporidium NOT DETECTED NOT DETECTED Final   Cyclospora cayetanensis NOT DETECTED NOT DETECTED Final   Entamoeba histolytica NOT DETECTED NOT DETECTED Final   Giardia lamblia NOT DETECTED NOT DETECTED Final   Adenovirus F40/41 NOT DETECTED NOT DETECTED Final   Astrovirus NOT DETECTED NOT DETECTED Final   Norovirus GI/GII NOT DETECTED NOT DETECTED Final   Rotavirus A NOT DETECTED NOT DETECTED Final   Sapovirus (I, II, IV, and V) NOT DETECTED NOT DETECTED Final    Comment: Performed at [facility], [address] [place], [place] [address]    Coagulation Studies: No results for input(s): "LABPROT", "INR" in the last 72 hours.  Urinalysis: No results for input(s): "COLORURINE", "LABSPEC", "PHURINE", "GLUCOSEU", "HGBUR", "BILIRUBINUR", "KETONESUR", "PROTEINUR", "UROBILINOGEN", "NITRITE", "LEUKOCYTESUR" in the last 72 hours.  Invalid input(s): "APPERANCEUR"    Imaging: No results  found.   Medications:    anticoagulant sodium citrate      sodium chloride   Intravenous Once   sodium chloride   Intravenous Once   sodium chloride   Intravenous Once   aspirin EC  81 mg Oral Daily   atorvastatin  80 mg Oral QPM   Chlorhexidine Gluconate Cloth  6 each Topical Q0600   cinacalcet  30 mg Oral Q breakfast   epoetin (EPOGEN/PROCRIT) injection  4,000 Units Intravenous Q M,W,F-HD   gabapentin  300 mg Oral BID   Ipratropium-Albuterol  1 puff Inhalation Q6H   levETIRAcetam  1,000 mg Oral Daily   levETIRAcetam  250 mg Oral Once per day on Mon Wed Fri   losartan  100 mg Oral QHS   metoprolol succinate  100 mg Oral Daily   pantoprazole  40 mg Oral Daily   sertraline  25 mg Oral Daily   torsemide  100 mg Oral Daily   traZODone  50 mg Oral QHS   acetaminophen **OR** acetaminophen, albuterol, alteplase, anticoagulant sodium citrate, heparin, HYDROcodone-acetaminophen, lidocaine (PF), lidocaine-prilocaine, pentafluoroprop-tetrafluoroeth,  polyethylene glycol-electrolytes  Assessment/ Plan:  [name] is a 72 y.o.  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 Patient presents to ED from dialysis with abnormal labs, hgb 5.7. She has been admitted under observation for Chronic blood loss anemia [D50.0] Gastrointestinal hemorrhage, unspecified gastrointestinal hemorrhage type [K92.2] Anemia, unspecified type [D64.9] Hemoglobin drop [R71.0]  [facility]/MWF/Lt AVG   1. End stage renal disease on hemodialysis. Receiving dialysis now, UF 0. Next treatment scheduled for Monday.   2. Anemia of chronic kidney disease with acute blood loss Lab Results  Component Value Date   HGB 7.9 (L) 09/02/2022   Hgb on admission 5.7 with positive guaiac. Patient has received 3 unit blood transfusion during this admission. GI consulted and  EGD on 08/28/22, no signs of active bleeding. Colonoscopy on 08/31/22 negative for active bleeding, blood clots and pooling noted in colon, believed to be diverticular in nature. Hgb remains low but improved with blood transfusion yesterday.   3. Hypertension with chronic kidney disease. Home regimen includes losartan, metoprolol, and torsemide. Currently receiving these medications.  Blood pressure  stable for this patient.   4. Secondary Hyperparathyroidism: with outpatient labs: PTH 613, phosphorus 4.8, calcium 9.0 on 03/07/22.    :  Lab Results  Component Value Date   PTH 131 (H) 03/14/2018   CALCIUM 8.0 (L) 09/02/2022   CAION 1.09 (L) 08/28/2022   PHOS 5.5 (H) 08/29/2022    Will continue to monitor bone minerals during this admission. Continue daily cinacalcet.    LOS: [place] 2/9/202411:46 AM

## 2022-09-02 NOTE — Care Management Important Message (Signed)
Important Message  Patient Details  Name: Cynthia Dean MRN: [id number] Date of Birth: 01-Jul-1951   Medicare Important Message Given:  Yes  Patient out of room upon time of visit, no family available.  Copy of Medicare IM left on counter in room for reference.   [name] 09/02/2022, 11:35 AM

## 2022-09-02 NOTE — Progress Notes (Signed)
PT did 3.5 hrs of HD, tolerated well with no signs of distress  UF = 500 ml    09/02/22 1156  Vitals  Temp 98.9 F (37.2 C)  Temp Source Oral  BP (!) 163/63  MAP (mmHg) 93  BP Location Right Arm  BP Method Automatic  Patient Position (if appropriate) Lying  Pulse Rate 71  Pulse Rate Source Monitor  ECG Heart Rate 71  Resp 18  Oxygen Therapy  SpO2 99 %  O2 Device Nasal Cannula  O2 Flow Rate (L/min) 2 L/min  Patient Activity (if Appropriate) In bed  Pulse Oximetry Type Continuous  During Treatment Monitoring  HD Safety Checks Performed Yes  Intra-Hemodialysis Comments Tx completed;Tolerated well  Post Treatment  Dialyzer Clearance Lightly streaked  Duration of HD Treatment -hour(s) 3.5 hour(s)  Hemodialysis Intake (mL) 0 mL  Liters Processed 84  Fluid Removed (mL) 500 mL  Tolerated HD Treatment Yes  Post-Hemodialysis Comments Tx complete. Tolerated well  AVG/AVF Arterial Site Held (minutes) 10 minutes  AVG/AVF Venous Site Held (minutes) 10 minutes  Note  Observations Tx ended  Fistula / Graft Left Forearm Arteriovenous fistula  No Placement Date or Time found.   Orientation: Left  Access Location: Forearm  Access Type: Arteriovenous fistula  Site Condition No complications  Fistula / Graft Assessment Present;Thrill;Bruit  Status Deaccessed  Drainage Description None

## 2022-09-02 NOTE — Discharge Summary (Deleted)
Physician Discharge Summary   [name] MRN: [id number]  DOB: 1951/01/06   Admit:     Date of Admission: 08/26/2022 Admitted from: home   Discharge: Date of discharge: 09/02/22 Disposition: Home Condition at discharge: fair  CODE STATUS: FULL CODE     Discharge Physician: Emeterio Reeve, DO [facility]     PCP: Lowella Bandy, MD  Recommendations for Outpatient Follow-up:  Follow up with PCP Marilynn Rail Mliss Sax, MD in 2-4 weeks Follow up with Oncology in 1-2 weeks - will need CBC checked there or at dialysis  Please obtain labs/tests: CBC in 1-2 weeks Please follow up on the following pending results: none PCP AND OTHER OUTPATIENT PROVIDERS: SEE BELOW FOR SPECIFIC [facility] PATIENT INFO    Discharge Instructions     Diet - low sodium heart healthy   Complete by: As directed    Increase activity slowly   Complete by: As directed    No wound care   Complete by: As directed          Discharge Diagnoses: Principal Problem:   Acute on chronic blood loss anemia Active Problems:   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-stage renal disease) ([place])   History of lower GI bleeding   Chronic anticoagulation   History of pulmonary embolism 03/20/2022   ESRD on hemodialysis (HCC)   Chronic diastolic CHF (congestive heart failure) ([place])   CAD (coronary artery disease)   Essential hypertension   COPD (chronic obstructive pulmonary disease) ([place])   Chronic respiratory failure with hypoxia (HCC)   Depression   Seizure disorder ([place])   History of CVA (cerebrovascular accident)   Insulin-requiring or dependent type II diabetes mellitus ([place])   Breast cancer, left S/P partial mastectomy,03/04/2022   Hemoglobin drop       Hospital Course: Cynthia Dean is a 72 y.o. female with medical history significant for ESRD on HD MWF, insulin requiring T2DM, COPD on home 4 L home O2, CAD,  carotid stenosis, seizure disorder, HTN, prior CVA, GERD, GI bleeding 2020 with unremarkable EGD and colonoscopy, left breast ca s/p partial mastectomy and left axillary dissection on 0000000 complicated by left breast abscess with coag negative staph bacteremia as well as small bilateral PE 03/20/2022, currently on Eliquis, who was sent from dialysis to the ED 08/26/2022 for evaluation of hemoglobin of 5.7, down from baseline of around 7.6-8.3.  02/02 (+)guaiac stool in ED.  Transfused 3 units PRBC.  EGD on 02/04 no concerns. Continued on po protonix.  colonoscopy 02/07 (had been delayed d/t weekend and to dialysis schedule) (+)BRB, diverticular disease, no active bleed noted. GI suspect she had diverticular bleed. 09/01/22 Hgb 7.0, receiving another unit PRBC at HD 02/08 Repeat HD this morning, 02/09, stable for discharge, CBC shows improved Hgb. Spoke w/ daughter and patient, all questions answered, ED precautions reviewed, confirmed stopping Eliquis   Consultants:  Nephrology Gastroenterology   Procedures: EGD 08/28/22 Colonoscopy 08/31/22      ASSESSMENT & PLAN:   Principal Problem:   Acute on chronic blood loss anemia Active Problems:   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-stage renal disease) ([place])   History of lower GI bleeding   Chronic anticoagulation   History of pulmonary embolism 03/20/2022   ESRD on hemodialysis (HCC)   Chronic diastolic CHF (congestive heart failure) (HCC)   CAD (coronary artery disease)   Essential hypertension   COPD (chronic obstructive pulmonary disease) (HCC)   Chronic respiratory  failure with hypoxia (HCC)   Depression   Seizure disorder ([place])   History of CVA (cerebrovascular accident)   Insulin-requiring or dependent type II diabetes mellitus ([place])   Breast cancer, left S/P partial mastectomy,03/04/2022   Hemoglobin drop   Acute on chronic blood loss anemia Anemia of chronic kidney disease Chronic anticoagulation with Eliquis - discontinued in shared  decision making w/ her daughter  History of rectal bleeding 01/2019 with unremarkable EGD and colonoscopy baseline hemoglobin around 8.0 s/p total 4u pRBC this admission EGD, unremarkable Colonoscopy, diverticular disease, BRB, likely diverticular bleed  Discontinuing Eliquis (taking for PE since August 2023) Monitor CBC outpatient    Chronic anticoagulation History of PE 03/20/2022 S/p partial mastectomy for left breast cancer 03/04/2022 Discontinuing Eliquis in the setting of GI bleeding and adeuqate treatment for primary VTE   Chronic diastolic CHF (congestive heart failure) ([place]) Clinically euvolemic cont home torsemide   ESRD on hemodialysis [facility]) Nephrology following Dialysis    CAD (coronary artery disease) Continue aspirin and atorvastatin   Essential hypertension Continue metoprolol and torsemide cont home losartan nightly   COPD (chronic obstructive pulmonary disease) ([place]) Chronic respiratory failure on 4L O2 Not currently exacerbated Continue home inhalers and baseline oxygen   Depression Continue sertraline and trazodone   Seizure disorder ([place]) Continue Keppra   Breast cancer, left S/P partial mastectomy 03/04/2022 Not followed by oncology.  No oncology follow up scheduled currently - her oncology team reached out for follow up but family declined. Patient's lumpectomy was complicated by abscess and staph bacteremia Patient says she does not think she wants to pursue any further treatment   Insulin-requiring or dependent type II diabetes mellitus ([place]) recent A1c 7.0 BG checks without SSI since BG have all been <150 and pt undergoing GI procedure and preps.   History of CVA (cerebrovascular accident) Continue aspirin and atorvastatin           Discharge Instructions  Allergies as of 09/02/2022       Reactions   Oxycodone Nausea And Vomiting   Oxycodone-acetaminophen Other (See Comments)   Wound Dressing Adhesive    Hydrocodone     Intolerant more than allergic   Tape Itching   Skin Dermatitis/itching (tape adhesive) Skin Dermatitis/itching (tape adhesive)   Tapentadol Itching   Skin Dermatitis/itching (tape adhesive) Skin Dermatitis/itching (tape adhesive)        Medication List     STOP taking these medications    apixaban 5 MG Tabs tablet Commonly known as: ELIQUIS   methylPREDNISolone 4 MG tablet Commonly known as: MEDROL       TAKE these medications    acetaminophen 500 MG tablet Commonly known as: TYLENOL Take 1 tablet (500 mg total) by mouth every 6 (six) hours as needed.   aspirin EC 81 MG tablet Take 1 tablet (81 mg total) by mouth daily. Swallow whole.   atorvastatin 80 MG tablet Commonly known as: LIPITOR Take 1 tablet (80 mg total) by mouth every evening.   cinacalcet 30 MG tablet Commonly known as: SENSIPAR Take 1 tablet (30 mg total) by mouth daily with breakfast.   diphenoxylate-atropine 2.5-0.025 MG tablet Commonly known as: LOMOTIL SMARTSIG:1 Tablet(s) By Mouth Every 12 Hours PRN   fluticasone 50 MCG/ACT nasal spray Commonly known as: FLONASE Place 1 spray into both nostrils daily.   gabapentin 300 MG capsule Commonly known as: NEURONTIN Take 1 capsule (300 mg total) by mouth 2 (two) times daily.   glucose 4 GM chewable tablet Chew 1 tablet (  4 g total) by mouth as needed for low blood sugar.   Ipratropium-Albuterol 20-100 MCG/ACT Aers respimat Commonly known as: COMBIVENT Inhale 1 puff into the lungs every 6 (six) hours.   levETIRAcetam 1000 MG tablet Commonly known as: KEPPRA Take 1 tablet (1,000 mg total) by mouth daily.   levETIRAcetam 250 MG tablet Commonly known as: KEPPRA Take 1 tablet (250 mg total) by mouth 3 (three) times a week.   lidocaine 5 % Commonly known as: LIDODERM Place 1 patch onto the skin daily. Remove & Discard patch within 12 hours or as directed by MD   losartan 100 MG tablet Commonly known as: COZAAR Take 1 tablet (100 mg total)  by mouth at bedtime.   metoprolol succinate 100 MG 24 hr tablet Commonly known as: TOPROL-XL Take 1 tablet (100 mg total) by mouth daily.   NovoLIN 70/30 (70-30) 100 UNIT/ML injection Generic drug: insulin NPH-regular Human Inject 28 Units into the skin 2 (two) times daily with a meal.   omeprazole 20 MG capsule Commonly known as: PRILOSEC Take 1 capsule (20 mg total) by mouth 2 (two) times daily.   sertraline 25 MG tablet Commonly known as: ZOLOFT Take 1 tablet (25 mg total) by mouth daily.   torsemide 100 MG tablet Commonly known as: DEMADEX Take 1 tablet (100 mg total) by mouth daily.   traZODone 50 MG tablet Commonly known as: DESYREL Take 1 tablet (50 mg total) by mouth at bedtime.   vitamin D3 25 MCG tablet Commonly known as: CHOLECALCIFEROL Take 1 tablet (1,000 Units total) by mouth daily.         Follow-up Information     [name], Mliss Sax, MD Follow up.   Specialty: Pediatrics Contact information: [place] [address] [phone]         Theodore Demark, RN. Call.                  Allergies  Allergen Reactions   Oxycodone Nausea And Vomiting   Oxycodone-Acetaminophen Other (See Comments)   Wound Dressing Adhesive    Hydrocodone     Intolerant more than allergic   Tape Itching    Skin Dermatitis/itching (tape adhesive) Skin Dermatitis/itching (tape adhesive)   Tapentadol Itching    Skin Dermatitis/itching (tape adhesive) Skin Dermatitis/itching (tape adhesive)      Subjective: pt feeling well this morning, no concerns    Discharge Exam: BP (!) 168/62 (BP Location: Right Arm)   Pulse 70   Temp 98.1 F (36.7 C)   Resp 18   Ht 5' 9"$  (1.753 m)   Wt 104.3 kg   SpO2 99%   BMI 33.96 kg/m  General: Pt is alert, awake, not in acute distress Cardiovascular: RRR, S1/S2 +, no rubs, no gallops Respiratory: CTA bilaterally, no wheezing, no rhonchi Abdominal: Soft, NT, ND, bowel sounds  + Extremities: no edema, no cyanosis     The results of significant diagnostics from this hospitalization (including imaging, microbiology, ancillary and laboratory) are listed below for reference.     Microbiology: Recent Results (from the past 240 hour(s))  MRSA Next Gen by PCR, Nasal     Status: None   Collection Time: 08/28/22  4:20 PM   Specimen: Nasal Mucosa; Nasal Swab  Result Value Ref Range Status   MRSA by PCR Next Gen NOT DETECTED NOT DETECTED Final    Comment: (NOTE) The GeneXpert MRSA Assay (FDA approved for NASAL specimens only), is one component  of a comprehensive MRSA colonization surveillance program. It is not intended to diagnose MRSA infection nor to guide or monitor treatment for MRSA infections. Test performance is not FDA approved in patients less than 60 years old. Performed at [facility], [address] [place], [place] [address]   Gastrointestinal Panel by PCR , Stool     Status: None   Collection Time: 08/29/22  3:44 PM   Specimen: Stool  Result Value Ref Range Status   Campylobacter species NOT DETECTED NOT DETECTED Final   Plesimonas shigelloides NOT DETECTED NOT DETECTED Final   Salmonella species NOT DETECTED NOT DETECTED Final   Yersinia enterocolitica NOT DETECTED NOT DETECTED Final   Vibrio species NOT DETECTED NOT DETECTED Final   Vibrio cholerae NOT DETECTED NOT DETECTED Final   Enteroaggregative E coli (EAEC) NOT DETECTED NOT DETECTED Final   Enteropathogenic E coli (EPEC) NOT DETECTED NOT DETECTED Final   Enterotoxigenic E coli (ETEC) NOT DETECTED NOT DETECTED Final   Shiga like toxin producing E coli (STEC) NOT DETECTED NOT DETECTED Final   Shigella/Enteroinvasive E coli (EIEC) NOT DETECTED NOT DETECTED Final   Cryptosporidium NOT DETECTED NOT DETECTED Final   Cyclospora cayetanensis NOT DETECTED NOT DETECTED Final   Entamoeba histolytica NOT DETECTED NOT DETECTED Final   Giardia lamblia NOT DETECTED NOT DETECTED Final    Adenovirus F40/41 NOT DETECTED NOT DETECTED Final   Astrovirus NOT DETECTED NOT DETECTED Final   Norovirus GI/GII NOT DETECTED NOT DETECTED Final   Rotavirus A NOT DETECTED NOT DETECTED Final   Sapovirus (I, II, IV, and V) NOT DETECTED NOT DETECTED Final    Comment: Performed at [facility], [address] [place], [place] [address]     Labs: BNP (last 3 results) No results for input(s): "BNP" in the last 8760 hours. Basic Metabolic Panel: Recent Labs  Lab 08/29/22 0506 08/30/22 0456 08/31/22 0413 09/01/22 0353 09/02/22 0529  NA 137 137 138 135 133*  K 4.8 3.8 4.3 4.8 4.3  CL 102 98 102 99 98  CO2 21* 27 26 26 27  $ GLUCOSE 110* 149* 104* 104* 151*  BUN 62* 35* 43* 53* 44*  CREATININE 9.05* 6.03* 7.53* 9.13* 7.43*  CALCIUM 8.2* 7.8* 8.3* 8.2* 8.0*  MG 1.8 1.8 1.8 1.7 1.7  PHOS 5.5*  --   --   --   --    Liver Function Tests: Recent Labs  Lab 08/26/22 1758  AST 13*  ALT 7  ALKPHOS 89  BILITOT 0.6  PROT 7.1  ALBUMIN 3.1*   No results for input(s): "LIPASE", "AMYLASE" in the last 168 hours. No results for input(s): "AMMONIA" in the last 168 hours. CBC: Recent Labs  Lab 08/29/22 0506 08/30/22 0456 08/31/22 0413 09/01/22 0353 09/02/22 0529  WBC 7.7 9.4 8.5 7.9 9.3  HGB 7.0* 7.7* 7.5* 7.0* 7.9*  HCT 22.4* 24.1* 24.0* 22.4* 24.6*  MCV 84.2 84.3 85.4 85.5 84.5  PLT 159 156 175 167 165   Cardiac Enzymes: No results for input(s): "CKTOTAL", "CKMB", "CKMBINDEX", "TROPONINI" in the last 168 hours. BNP: Invalid input(s): "POCBNP" CBG: Recent Labs  Lab 09/01/22 0831 09/01/22 1611 09/01/22 2059 09/02/22 0741 09/02/22 1248  GLUCAP 90 108* 147* 142* 110*   D-Dimer No results for input(s): "DDIMER" in the last 72 hours. Hgb A1c No results for input(s): "HGBA1C" in the last 72 hours. Lipid Profile No results for input(s): "CHOL", "HDL", "LDLCALC", "TRIG", "CHOLHDL", "LDLDIRECT" in the last 72 hours. Thyroid function studies No results for  input(s):  "TSH", "T4TOTAL", "T3FREE", "THYROIDAB" in the last 72 hours.  Invalid input(s): "FREET3" Anemia work up No results for input(s): "VITAMINB12", "FOLATE", "FERRITIN", "TIBC", "IRON", "RETICCTPCT" in the last 72 hours. Urinalysis    Component Value Date/Time   COLORURINE YELLOW (A) 03/20/2022 0125   APPEARANCEUR CLEAR (A) 03/20/2022 0125   APPEARANCEUR Clear 06/14/2014 1510   LABSPEC 1.010 03/20/2022 0125   LABSPEC 1.006 06/14/2014 1510   PHURINE 7.0 03/20/2022 0125   GLUCOSEU 50 (A) 03/20/2022 0125   GLUCOSEU >=500 06/14/2014 1510   HGBUR MODERATE (A) 03/20/2022 0125   BILIRUBINUR NEGATIVE 03/20/2022 0125   BILIRUBINUR Negative 06/14/2014 1510   KETONESUR NEGATIVE 03/20/2022 0125   PROTEINUR 100 (A) 03/20/2022 0125   NITRITE NEGATIVE 03/20/2022 0125   LEUKOCYTESUR NEGATIVE 03/20/2022 0125   LEUKOCYTESUR Negative 06/14/2014 1510   Sepsis Labs Recent Labs  Lab 08/30/22 0456 08/31/22 0413 09/01/22 0353 09/02/22 0529  WBC 9.4 8.5 7.9 9.3   Microbiology Recent Results (from the past 240 hour(s))  MRSA Next Gen by PCR, Nasal     Status: None   Collection Time: 08/28/22  4:20 PM   Specimen: Nasal Mucosa; Nasal Swab  Result Value Ref Range Status   MRSA by PCR Next Gen NOT DETECTED NOT DETECTED Final    Comment: (NOTE) The GeneXpert MRSA Assay (FDA approved for NASAL specimens only), is one component of a comprehensive MRSA colonization surveillance program. It is not intended to diagnose MRSA infection nor to guide or monitor treatment for MRSA infections. Test performance is not FDA approved in patients less than 39 years old. Performed at [facility], [address] [place], [place] [address]   Gastrointestinal Panel by PCR , Stool     Status: None   Collection Time: 08/29/22  3:44 PM   Specimen: Stool  Result Value Ref Range Status   Campylobacter species NOT DETECTED NOT DETECTED Final   Plesimonas shigelloides NOT DETECTED NOT DETECTED Final    Salmonella species NOT DETECTED NOT DETECTED Final   Yersinia enterocolitica NOT DETECTED NOT DETECTED Final   Vibrio species NOT DETECTED NOT DETECTED Final   Vibrio cholerae NOT DETECTED NOT DETECTED Final   Enteroaggregative E coli (EAEC) NOT DETECTED NOT DETECTED Final   Enteropathogenic E coli (EPEC) NOT DETECTED NOT DETECTED Final   Enterotoxigenic E coli (ETEC) NOT DETECTED NOT DETECTED Final   Shiga like toxin producing E coli (STEC) NOT DETECTED NOT DETECTED Final   Shigella/Enteroinvasive E coli (EIEC) NOT DETECTED NOT DETECTED Final   Cryptosporidium NOT DETECTED NOT DETECTED Final   Cyclospora cayetanensis NOT DETECTED NOT DETECTED Final   Entamoeba histolytica NOT DETECTED NOT DETECTED Final   Giardia lamblia NOT DETECTED NOT DETECTED Final   Adenovirus F40/41 NOT DETECTED NOT DETECTED Final   Astrovirus NOT DETECTED NOT DETECTED Final   Norovirus GI/GII NOT DETECTED NOT DETECTED Final   Rotavirus A NOT DETECTED NOT DETECTED Final   Sapovirus (I, II, IV, and V) NOT DETECTED NOT DETECTED Final    Comment: Performed at [facility], [address] [place], [place] [address]   Imaging No results found.    Time coordinating discharge: over 30 minutes  SIGNED:  Emeterio Reeve DO [facility]

## 2022-09-02 NOTE — Progress Notes (Addendum)
PIV consult: R chest power port site unremarkable (not accessed). Discussed port access with pt, she seemed hesitant to use port since "it hasn't been used since chemo." Implanted port teaching done, pt remained hesitant. PIV started, informed pt we will revisit port access if PIV fails.

## 2022-09-02 NOTE — Progress Notes (Signed)
PROGRESS NOTE    Cynthia Dean   [id number] DOB: [birth year]  DOA: 08/26/2022 Date of Service: 09/02/22 PCP: Lowella Bandy, MD     Brief Narrative / Hospital Course:  Cynthia Dean is a 72 y.o. female with medical history significant for ESRD on HD MWF, insulin requiring T2DM, COPD on home 4 L home O2, CAD, carotid stenosis, seizure disorder, HTN, prior CVA, GERD, GI bleeding 2020 with unremarkable EGD and colonoscopy, left breast ca s/p partial mastectomy and left axillary dissection on 0000000 complicated by left breast abscess with coag negative staph bacteremia as well as small bilateral PE 03/20/2022, currently on Eliquis, who was sent from dialysis to the ED 08/26/2022 for evaluation of hemoglobin of 5.7, down from baseline of around 7.6-8.3.  02/02 (+)guaiac stool in ED.  Transfused 3 units PRBC.  EGD on 02/04 no concerns. Continued on po protonix.  colonoscopy 02/07 (had been delayed d/t weekend and to dialysis schedule) (+)BRB, diverticular disease, no active bleed noted. GI suspect she had diverticular bleed. 09/01/22 Hgb 7.0, receiving another unit PRBC at HD.  Repeat HD morning, 02/09, CBC shows improved Hgb. Spoke w/ daughter and patient, all questions answered, confirmed stopping Eliquis - pt was set to discharge but then unable to stand up to get dressed, d/c cancelled and PT/OT to see if able to qualify for [facility]   Consultants:  Nephrology Gastroenterology   Procedures: EGD 08/28/22 Colonoscopy 08/31/22      ASSESSMENT & PLAN:   Principal Problem:   Acute on chronic blood loss anemia Active Problems:   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-stage renal disease) ([place])   History of lower GI bleeding   Chronic anticoagulation   History of pulmonary embolism 03/20/2022   ESRD on hemodialysis (HCC)   Chronic diastolic CHF (congestive heart failure) ([place])   CAD (coronary artery disease)   Essential hypertension   COPD (chronic obstructive pulmonary disease)  ([place])   Chronic respiratory failure with hypoxia (HCC)   Depression   Seizure disorder ([place])   History of CVA (cerebrovascular accident)   Insulin-requiring or dependent type II diabetes mellitus ([place])   Breast cancer, left S/P partial mastectomy,03/04/2022   Hemoglobin drop   Acute on chronic blood loss anemia Anemia of chronic kidney disease Chronic anticoagulation with Eliquis - discontinued in shared decision making w/ her daughter  History of rectal bleeding 01/2019 with unremarkable EGD and colonoscopy baseline hemoglobin around 8.0 s/p total 4u pRBC this admission EGD, unremarkable Colonoscopy, diverticular disease, BRB, likely diverticular bleed  Discontinuing Eliquis (taking for PE since August 2023) Monitor CBC outpatient    Chronic anticoagulation History of PE 03/20/2022 S/p partial mastectomy for left breast cancer 03/04/2022 Discontinuing Eliquis in the setting of GI bleeding and adeuqate treatment for primary VTE   Chronic diastolic CHF (congestive heart failure) (HCC) Clinically euvolemic cont home torsemide   ESRD on hemodialysis [facility]) Nephrology following Dialysis    CAD (coronary artery disease) Continue aspirin and atorvastatin   Essential hypertension Continue metoprolol and torsemide cont home losartan nightly   COPD (chronic obstructive pulmonary disease) ([place]) Chronic respiratory failure on 4L O2 Not currently exacerbated Continue home inhalers and baseline oxygen   Depression Continue sertraline and trazodone   Seizure disorder ([place]) Continue Keppra   Breast cancer, left S/P partial mastectomy 03/04/2022 Not followed by oncology.  No oncology follow up scheduled currently - her oncology team reached out for follow up but family declined. Patient's lumpectomy was complicated by abscess and staph bacteremia Patient says  she does not think she wants to pursue any further treatment   Insulin-requiring or dependent type II diabetes mellitus  ([place]) recent A1c 7.0 BG checks without SSI since BG have all been <150 and pt undergoing GI procedure and preps.   History of CVA (cerebrovascular accident) Continue aspirin and atorvastatin    DVT prophylaxis: SCD/compression hose  Pertinent IV fluids/nutrition: none Central lines / invasive devices: none  Code Status: FULL CODE  Current Admission Status: inpatient  Med/Surg  TOC needs / Dispo plan: anticipate d/c home to previous environment Barriers to discharge / significant pending items: pending colonoscopy              Subjective / Brief ROS:  Patient reports abdominal pain has resolved Denies CP/SOB.  Pain controlled.  Denies new weakness.  Tolerating diet.  Reports no concerns w/ urination/defecation.   Family Communication: as above spoke today w/ daughter     Objective Findings:  Vitals:   09/02/22 1130 09/02/22 1156 09/02/22 1211 09/02/22 1247  BP: (!) 174/64 (!) 163/63  (!) 168/62  Pulse: 70 71  70  Resp: (!) 22 18  18  $ Temp: 98.9 F (37.2 C) 98.9 F (37.2 C)  98.1 F (36.7 C)  TempSrc: Oral Oral    SpO2: 100% 99%  99%  Weight:   104.3 kg   Height:        Intake/Output Summary (Last 24 hours) at 09/02/2022 1548 Last data filed at 09/02/2022 1156 Gross per 24 hour  Intake 180 ml  Output 700 ml  Net -520 ml   Filed Weights   09/01/22 1420 09/02/22 0816 09/02/22 1211  Weight: 105.7 kg 105 kg 104.3 kg    Examination:  Physical Exam Constitutional:      General: She is not in acute distress.    Appearance: Normal appearance. She is obese.  Cardiovascular:     Rate and Rhythm: Normal rate and regular rhythm.  Pulmonary:     Effort: Pulmonary effort is normal.     Breath sounds: Normal breath sounds.  Abdominal:     Palpations: Abdomen is soft.  Neurological:     General: No focal deficit present.     Mental Status: She is alert. Mental status is at baseline.  Psychiatric:        Mood and Affect: Mood normal.        Behavior:  Behavior normal.          Scheduled Medications:   sodium chloride   Intravenous Once   sodium chloride   Intravenous Once   sodium chloride   Intravenous Once   aspirin EC  81 mg Oral Daily   atorvastatin  80 mg Oral QPM   Chlorhexidine Gluconate Cloth  6 each Topical Q0600   cinacalcet  30 mg Oral Q breakfast   epoetin alfa       epoetin (EPOGEN/PROCRIT) injection  4,000 Units Intravenous Q M,W,F-HD   gabapentin  300 mg Oral BID   Ipratropium-Albuterol  1 puff Inhalation Q6H   levETIRAcetam  1,000 mg Oral Daily   levETIRAcetam  250 mg Oral Once per day on Mon Wed Fri   losartan  100 mg Oral QHS   metoprolol succinate  100 mg Oral Daily   pantoprazole  40 mg Oral Daily   sertraline  25 mg Oral Daily   torsemide  100 mg Oral Daily   traZODone  50 mg Oral QHS    Continuous Infusions:  anticoagulant sodium citrate  PRN Medications:  acetaminophen **OR** acetaminophen, albuterol, alteplase, anticoagulant sodium citrate, epoetin alfa, heparin, HYDROcodone-acetaminophen, lidocaine (PF), lidocaine-prilocaine, pentafluoroprop-tetrafluoroeth, polyethylene glycol-electrolytes  Antimicrobials from admission:  Anti-infectives (From admission, onward)    None           Data Reviewed:  I have personally reviewed the following...  CBC: Recent Labs  Lab 08/29/22 0506 08/30/22 0456 08/31/22 0413 09/01/22 0353 09/02/22 0529  WBC 7.7 9.4 8.5 7.9 9.3  HGB 7.0* 7.7* 7.5* 7.0* 7.9*  HCT 22.4* 24.1* 24.0* 22.4* 24.6*  MCV 84.2 84.3 85.4 85.5 84.5  PLT 159 156 175 167 [id number]   Basic Metabolic Panel: Recent Labs  Lab 08/29/22 0506 08/30/22 0456 08/31/22 0413 09/01/22 0353 09/02/22 0529  NA 137 137 138 135 133*  K 4.8 3.8 4.3 4.8 4.3  CL 102 98 102 99 98  CO2 21* 27 26 26 27  $ GLUCOSE 110* 149* 104* 104* 151*  BUN 62* 35* 43* 53* 44*  CREATININE 9.05* 6.03* 7.53* 9.13* 7.43*  CALCIUM 8.2* 7.8* 8.3* 8.2* 8.0*  MG 1.8 1.8 1.8 1.7 1.7  PHOS 5.5*  --   --   --    --    GFR: Estimated Creatinine Clearance: 8.9 mL/min (A) (by C-G formula based on SCr of 7.43 mg/dL (H)). Liver Function Tests: Recent Labs  Lab 08/26/22 1758  AST 13*  ALT 7  ALKPHOS 89  BILITOT 0.6  PROT 7.1  ALBUMIN 3.1*   No results for input(s): "LIPASE", "AMYLASE" in the last 168 hours. No results for input(s): "AMMONIA" in the last 168 hours. Coagulation Profile: No results for input(s): "INR", "PROTIME" in the last 168 hours. Cardiac Enzymes: No results for input(s): "CKTOTAL", "CKMB", "CKMBINDEX", "TROPONINI" in the last 168 hours. BNP (last 3 results) No results for input(s): "PROBNP" in the last 8760 hours. HbA1C: No results for input(s): "HGBA1C" in the last 72 hours. CBG: Recent Labs  Lab 09/01/22 0831 09/01/22 1611 09/01/22 2059 09/02/22 0741 09/02/22 1248  GLUCAP 90 108* 147* 142* 110*   Lipid Profile: No results for input(s): "CHOL", "HDL", "LDLCALC", "TRIG", "CHOLHDL", "LDLDIRECT" in the last 72 hours. Thyroid Function Tests: No results for input(s): "TSH", "T4TOTAL", "FREET4", "T3FREE", "THYROIDAB" in the last 72 hours. Anemia Panel: No results for input(s): "VITAMINB12", "FOLATE", "FERRITIN", "TIBC", "IRON", "RETICCTPCT" in the last 72 hours. Most Recent Urinalysis On File:     Component Value Date/Time   COLORURINE YELLOW (A) 03/20/2022 0125   APPEARANCEUR CLEAR (A) 03/20/2022 0125   APPEARANCEUR Clear 06/14/2014 1510   LABSPEC 1.010 03/20/2022 0125   LABSPEC 1.006 06/14/2014 1510   PHURINE 7.0 03/20/2022 0125   GLUCOSEU 50 (A) 03/20/2022 0125   GLUCOSEU >=500 06/14/2014 1510   HGBUR MODERATE (A) 03/20/2022 0125   BILIRUBINUR NEGATIVE 03/20/2022 0125   BILIRUBINUR Negative 06/14/2014 1510   KETONESUR NEGATIVE 03/20/2022 0125   PROTEINUR 100 (A) 03/20/2022 0125   NITRITE NEGATIVE 03/20/2022 0125   LEUKOCYTESUR NEGATIVE 03/20/2022 0125   LEUKOCYTESUR Negative 06/14/2014 1510   Sepsis  Labs: @LABRCNTIP$ (procalcitonin:4,lacticidven:4) Microbiology: Recent Results (from the past 240 hour(s))  MRSA Next Gen by PCR, Nasal     Status: None   Collection Time: 08/28/22  4:20 PM   Specimen: Nasal Mucosa; Nasal Swab  Result Value Ref Range Status   MRSA by PCR Next Gen NOT DETECTED NOT DETECTED Final    Comment: (NOTE) The GeneXpert MRSA Assay (FDA approved for NASAL specimens only), is one component of a comprehensive MRSA colonization surveillance program. It is not  intended to diagnose MRSA infection nor to guide or monitor treatment for MRSA infections. Test performance is not FDA approved in patients less than 68 years old. Performed at [facility], [address] [place], [place] [address]   Gastrointestinal Panel by PCR , Stool     Status: None   Collection Time: 08/29/22  3:44 PM   Specimen: Stool  Result Value Ref Range Status   Campylobacter species NOT DETECTED NOT DETECTED Final   Plesimonas shigelloides NOT DETECTED NOT DETECTED Final   Salmonella species NOT DETECTED NOT DETECTED Final   Yersinia enterocolitica NOT DETECTED NOT DETECTED Final   Vibrio species NOT DETECTED NOT DETECTED Final   Vibrio cholerae NOT DETECTED NOT DETECTED Final   Enteroaggregative E coli (EAEC) NOT DETECTED NOT DETECTED Final   Enteropathogenic E coli (EPEC) NOT DETECTED NOT DETECTED Final   Enterotoxigenic E coli (ETEC) NOT DETECTED NOT DETECTED Final   Shiga like toxin producing E coli (STEC) NOT DETECTED NOT DETECTED Final   Shigella/Enteroinvasive E coli (EIEC) NOT DETECTED NOT DETECTED Final   Cryptosporidium NOT DETECTED NOT DETECTED Final   Cyclospora cayetanensis NOT DETECTED NOT DETECTED Final   Entamoeba histolytica NOT DETECTED NOT DETECTED Final   Giardia lamblia NOT DETECTED NOT DETECTED Final   Adenovirus F40/41 NOT DETECTED NOT DETECTED Final   Astrovirus NOT DETECTED NOT DETECTED Final   Norovirus GI/GII NOT DETECTED NOT DETECTED Final   Rotavirus  A NOT DETECTED NOT DETECTED Final   Sapovirus (I, II, IV, and V) NOT DETECTED NOT DETECTED Final    Comment: Performed at [facility], [address], [place], [place] [address]      Radiology Studies last 3 days: No results found.           LOS: 5 days      Emeterio Reeve, DO [facility] 09/02/2022, 3:48 PM    Dictation software may have been used to generate the above note. Typos may occur and escape review in typed/dictated notes. Please contact Dr Sheppard Coil directly for clarity if needed.  Staff may message me via secure chat in [place]  but this may not receive an immediate response,  please page me for urgent matters!  If 7PM-7AM, please contact night coverage [email or web address]

## 2022-09-03 DIAGNOSIS — D62 Acute posthemorrhagic anemia: Secondary | ICD-10-CM | POA: Diagnosis not present

## 2022-09-03 LAB — GLUCOSE, CAPILLARY
Glucose-Capillary: 156 mg/dL — ABNORMAL HIGH (ref 70–99)
Glucose-Capillary: 150 mg/dL — ABNORMAL HIGH (ref 70–99)
Glucose-Capillary: 147 mg/dL — ABNORMAL HIGH (ref 70–99)
Glucose-Capillary: 130 mg/dL — ABNORMAL HIGH (ref 70–99)
Glucose-Capillary: 139 mg/dL — ABNORMAL HIGH (ref 70–99)

## 2022-09-03 MED ORDER — GABAPENTIN 100 MG PO CAPS: 100.0000 mg | ORAL_CAPSULE | Freq: Two times a day (BID) | ORAL | 0 refills | Status: DC

## 2022-09-03 MED ORDER — GABAPENTIN 100 MG PO CAPS
100.0000 mg | ORAL_CAPSULE | Freq: Two times a day (BID) | ORAL | Status: DC
  Administered 2022-09-03 – 2022-09-09 (×12): 100 mg via ORAL
  Filled 2022-09-03 (×12): qty 1

## 2022-09-03 MED ORDER — INSULIN ASPART 100 UNIT/ML IJ SOLN
0.0000 [IU] | Freq: Three times a day (TID) | INTRAMUSCULAR | Status: DC
  Administered 2022-09-06 – 2022-09-08 (×3): 1 [IU] via SUBCUTANEOUS
  Filled 2022-09-03 (×6): qty 1

## 2022-09-03 NOTE — Evaluation (Signed)
Physical Therapy Evaluation Patient Details Name: Cynthia Dean MRN: [id number] DOB: August 03, 1950 Today's Date: 09/03/2022  History of Present Illness  Pt is a 72 y.o. female with medical history significant for ESRD on HD MWF, insulin requiring T2DM, COPD on 4LO2, CAD, carotid stenosis, seizure disorder, HTN, prior CVA, GERD, GI bleeding, left breast CA s/p partial mastectomy and left axillary dissection, small bilateral PE currently on Eliquis, who was sent from dialysis to the ED 08/26/2022 for evaluation of hemoglobin of 5.7.  MD assessment includes: Acute on chronic blood loss anemia and anemia of chronic disease.   Clinical Impression  Pt lethargic with flat affect during the session but able to answer most simple yes/no type questions and follow 1-step commands with extra time and cuing.  Pt was an unreliable historian with below history taken partly from the patient and from chart reviewing prior admissions.  Pt presented with significant functional weakness and required total assist with bed mobility tasks and was unable to come to standing even with +3 assist.  Pt was able to sit at the EOB with fair static sitting balance and put forth fair effort with below exercises.  Pt will benefit from PT services in a SNF setting upon discharge to safely address deficits listed in patient problem list for decreased caregiver assistance and eventual return to PLOF.          Recommendations for follow up therapy are one component of a multi-disciplinary discharge planning process, led by the attending physician.  Recommendations may be updated based on patient status, additional functional criteria and insurance authorization.  Follow Up Recommendations Skilled nursing-short term rehab (<3 hours/day) Can patient physically be transported by private vehicle: No    Assistance Recommended at Discharge Frequent or constant Supervision/Assistance  Patient can return home with the following  Two  people to help with walking and/or transfers;Two people to help with bathing/dressing/bathroom;Assistance with cooking/housework;Direct supervision/assist for medications management;Assist for transportation;Help with stairs or ramp for entrance    Equipment Recommendations Other (comment) (TBD at next venue of care)  Recommendations for Other Services       Functional Status Assessment Patient has had a recent decline in their functional status and/or demonstrates limited ability to make significant improvements in function in a reasonable and predictable amount of time     Precautions / Restrictions Precautions Precautions: Fall Restrictions Weight Bearing Restrictions: No Other Position/Activity Restrictions: h/o seizures      Mobility  Bed Mobility Overal bed mobility: Needs Assistance Bed Mobility: Supine to Sit, Sit to Supine     Supine to sit: Total assist, +2 for physical assistance Sit to supine: Total assist, +2 for physical assistance   General bed mobility comments: +2 to 3 for BLE and trunk control    Transfers                   General transfer comment: +3 assist during multiple attempts to stand from an elevated EOB with pt unable to come to standing    Ambulation/Gait                  Stairs            Wheelchair Mobility    Modified Rankin (Stroke Patients Only)       Balance Overall balance assessment: Needs assistance Sitting-balance support: Bilateral upper extremity supported Sitting balance-Leahy Scale: Fair         Standing balance comment: Unable to come to standing  Pertinent Vitals/Pain Pain Assessment Pain Assessment: No/denies pain    Home Living Family/patient expects to be discharged to:: Private residence Living Arrangements: Non-relatives/Friends;Children Available Help at Discharge: Family;Available PRN/intermittently Type of Home: House Home Access: Stairs to  enter Entrance Stairs-Rails: None Entrance Stairs-Number of Steps: 3   Home Layout: One level Home Equipment: Conservation officer, nature (2 wheels);Wheelchair - manual      Prior Function Prior Level of Function : Needs assist             Mobility Comments: Able to amb short household distances with a RW at baseline ADLs Comments: Needs assist for ADL/IADL     Hand Dominance   Dominant Hand: Right    Extremity/Trunk Assessment   Upper Extremity Assessment Upper Extremity Assessment: Generalized weakness    Lower Extremity Assessment Lower Extremity Assessment: Generalized weakness       Communication   Communication: Other (comment) (Slow to respond to questions, very limited verbal communication)  Cognition Arousal/Alertness: Lethargic Behavior During Therapy: Flat affect Overall Cognitive Status: No family/caregiver present to determine baseline cognitive functioning                                          General Comments      Exercises Total Joint Exercises Ankle Circles/Pumps: AROM, AAROM, Both, 5 reps, 10 reps (h/o R foot drop) Quad Sets: Strengthening, Both, 5 reps, 10 reps Heel Slides: AAROM, Strengthening, Both, 10 reps Hip ABduction/ADduction: AAROM, Strengthening, Both, 10 reps Straight Leg Raises: AAROM, Strengthening, Both, 10 reps Other Exercises Other Exercises: Static sitting at EOB x 5 min for improved activity tolerance and static sitting balance   Assessment/Plan    PT Assessment Patient needs continued PT services  PT Problem List Decreased strength;Decreased activity tolerance;Decreased balance;Decreased mobility;Decreased knowledge of use of DME       PT Treatment Interventions DME instruction;Gait training;Stair training;Functional mobility training;Patient/family education;Therapeutic activities;Therapeutic exercise;Balance training    PT Goals (Current goals can be found in the Care Plan section)  Acute Rehab PT  Goals Patient Stated Goal: To get stronger PT Goal Formulation: With patient Time For Goal Achievement: 09/16/22 Potential to Achieve Goals: Fair    Frequency Min 2X/week     Co-evaluation               AM-PAC PT "6 Clicks" Mobility  Outcome Measure Help needed turning from your back to your side while in a flat bed without using bedrails?: Total Help needed moving from lying on your back to sitting on the side of a flat bed without using bedrails?: Total Help needed moving to and from a bed to a chair (including a wheelchair)?: Total Help needed standing up from a chair using your arms (e.g., wheelchair or bedside chair)?: Total Help needed to walk in hospital room?: Total Help needed climbing 3-5 steps with a railing? : Total 6 Click Score: 6    End of Session Equipment Utilized During Treatment: Gait belt;Oxygen Activity Tolerance: Patient tolerated treatment well Patient left: in bed;with call bell/phone within reach;with bed alarm set;with nursing/sitter in room Nurse Communication: Mobility status PT Visit Diagnosis: Difficulty in walking, not elsewhere classified (R26.2);Muscle weakness (generalized) (M62.81)    Time: 1010-1031 PT Time Calculation (min) (ACUTE ONLY): 21 min   Charges:   PT Evaluation $PT Eval Moderate Complexity: 1 Mod PT Treatments $Therapeutic Exercise: 8-22 mins  Linus Salmons PT, DPT 09/03/22, 10:57 AM

## 2022-09-03 NOTE — Evaluation (Signed)
Occupational Therapy Evaluation Patient Details Name: Cynthia Dean MRN: [id number] DOB: 05/30/1951 Today's Date: 09/03/2022   History of Present Illness Pt is a 72 y.o. female with medical history significant for ESRD on HD MWF, insulin requiring T2DM, COPD on 4LO2, CAD, carotid stenosis, seizure disorder, HTN, prior CVA, GERD, GI bleeding, left breast CA s/p partial mastectomy and left axillary dissection, small bilateral PE currently on Eliquis, who was sent from dialysis to the ED 08/26/2022 for evaluation of hemoglobin of 5.7.  MD assessment includes: Acute on chronic blood loss anemia and anemia of chronic disease.   Clinical Impression   Pt seen for OT evaluation this date.  Husband present during session to provide additional details.  Pt lives at home with her spouse and has a daughter who comes to help with bathing, dressing and medication organization.  Husband reports he assists with transfers, meals, dressing on a daily basis and gives her medication.  He reports she has required increased assistance more recently.  He fed her lunch this date but reports at home she was able to sit and feed herself.  She is now requiring total assist for transfers, mobility and self care tasks.  She would benefit from skilled OT services to maximize safety and independence in ADL and IADL tasks and reduce caregiver burden at home.  She would likely require short term rehab prior to returning home based on her current level of function.       Recommendations for follow up therapy are one component of a multi-disciplinary discharge planning process, led by the attending physician.  Recommendations may be updated based on patient status, additional functional criteria and insurance authorization.   Follow Up Recommendations  Skilled nursing-short term rehab (<3 hours/day)     Assistance Recommended at Discharge Frequent or constant Supervision/Assistance  Patient can return home with the  following Two people to help with walking and/or transfers;A lot of help with bathing/dressing/bathroom;Assistance with cooking/housework;Direct supervision/assist for medications management;Assist for transportation;A lot of help with walking and/or transfers;Two people to help with bathing/dressing/bathroom;Assistance with feeding    Functional Status Assessment  Patient has had a recent decline in their functional status and demonstrates the ability to make significant improvements in function in a reasonable and predictable amount of time.  Equipment Recommendations  None recommended by OT    Recommendations for Other Services       Precautions / Restrictions Precautions Precautions: Fall Restrictions Weight Bearing Restrictions: No      Mobility Bed Mobility               General bed mobility comments: +2-3 people this date for bed mobility    Transfers                   General transfer comment: unable to stand this date      Balance                                           ADL either performed or assessed with clinical judgement   ADL Overall ADL's : Needs assistance/impaired Eating/Feeding: Maximal assistance   Grooming: Maximal assistance   Upper Body Bathing: Total assistance   Lower Body Bathing: Total assistance   Upper Body Dressing : Total assistance   Lower Body Dressing: Total assistance   Toilet Transfer: Total assistance Toilet Transfer Details (indicate cue type and reason):  PT reports +3 this date for OOB activities Toileting- Water quality scientist and Hygiene: Total assistance               Vision         Perception     Praxis      Pertinent Vitals/Pain Pain Assessment Pain Assessment: No/denies pain     Hand Dominance Right   Extremity/Trunk Assessment Upper Extremity Assessment Upper Extremity Assessment: Generalized weakness;RUE deficits/detail;LUE deficits/detail RUE Deficits /  Details: Shoulder flexion to 90 degrees of motion, decreased strength bilaterally, able to make a fist, perform opposition to ring finger.  Slow to respond to requests, strength 2/5 overall UE LUE Deficits / Details: Shoulder flexion to 90 degrees of motion, decreased strength bilaterally, able to make a fist, perform opposition to small finger. 2/5 overall   Lower Extremity Assessment Lower Extremity Assessment: Defer to PT evaluation       Communication Communication Communication: Other (comment) (pt is slow to respond, requires redirection to questions.)   Cognition Arousal/Alertness: Lethargic Behavior During Therapy: Flat affect Overall Cognitive Status: Impaired/Different from baseline Area of Impairment: Memory, Attention, Safety/judgement, Awareness                     Memory: Decreased short-term memory   Safety/Judgement: Decreased awareness of deficits, Decreased awareness of safety     General Comments: Pt slow to respond to 1 step commands, appears lethargic at times, poor historian, her husband was present to provide addtional details.     General Comments       Exercises     Shoulder Instructions      Home Living Family/patient expects to be discharged to:: Private residence Living Arrangements: Non-relatives/Friends;Children;Spouse/significant other Available Help at Discharge: Family;Available PRN/intermittently Type of Home: House Home Access: Stairs to enter [employer] of Steps: 3 Entrance Stairs-Rails: None Home Layout: One level     Bathroom Shower/Tub: Tub/shower unit;Curtain   Biochemist, clinical: Standard Bathroom Accessibility: Yes   Home Equipment: Conservation officer, nature (2 wheels);Wheelchair - manual          Prior Functioning/Environment Prior Level of Function : Needs assist       Physical Assist : ADLs (physical);Mobility (physical)   ADLs (physical): Grooming;Bathing;Dressing;Toileting Mobility Comments: Able to amb  short household distances with a RW at baseline but more recently she transferred from bed to wheelchair or bed to [facility] ADLs Comments: Husband reports pt's daughter was helping her with bathing and sorting/organizing meds, husband would help with dressing, meals, transfers and meds.  He reports at home she was feeding herself and here at the hospital he has been feeding her.  He reports they assist more than 50% during self care.        OT Problem List: Decreased strength;Decreased activity tolerance;Decreased cognition;Decreased knowledge of use of DME or AE;Impaired UE functional use;Decreased range of motion;Impaired balance (sitting and/or standing);Decreased safety awareness      OT Treatment/Interventions: Self-care/ADL training;DME and/or AE instruction;Therapeutic activities;Balance training;Therapeutic exercise;Patient/family education;Cognitive remediation/compensation    OT Goals(Current goals can be found in the care plan section) Acute Rehab OT Goals Patient Stated Goal: Pt would like to be as independent as possible, be able to feed herself OT Goal Formulation: With patient/family Time For Goal Achievement: 09/17/22 Potential to Achieve Goals: Fair ADL Goals Pt Will Perform Eating: with modified independence Pt Will Perform Upper Body Bathing: with mod assist Pt Will Perform Upper Body Dressing: with min assist Pt Will Perform Lower Body Dressing: with mod assist  Pt Will Transfer to Toilet: with mod assist  OT Frequency: Min 2X/week    Co-evaluation              AM-PAC OT "6 Clicks" Daily Activity     Outcome Measure Help from another person eating meals?: A Lot Help from another person taking care of personal grooming?: A Lot Help from another person toileting, which includes using toliet, bedpan, or urinal?: Total Help from another person bathing (including washing, rinsing, drying)?: Total Help from another person to put on and taking off regular upper body  clothing?: Total Help from another person to put on and taking off regular lower body clothing?: Total 6 Click Score: 8   End of Session    Activity Tolerance: Patient limited by lethargy Patient left: in bed;with call bell/phone within reach;with bed alarm set  OT Visit Diagnosis: Muscle weakness (generalized) (M62.81);Cognitive communication deficit (R41.841);History of falling (Z91.81)                Time: 1450-1510 OT Time Calculation (min): 20 min Charges:  OT General Charges $OT Visit: 1 Visit OT Evaluation $OT Eval Moderate Complexity: 1 Mod  Bryce Kimble T Almarie Kurdziel, OTR/L, CLT Algernon Mundie 09/03/2022, 3:42 PM

## 2022-09-03 NOTE — TOC Progression Note (Signed)
Transition of Care [facility]) - Progression Note    Patient Details  Name: Cynthia Dean MRN: [id number] Date of Birth: 30-May-1951  [facility]) CM/SW Contact  Valente David, RN Phone Number: 09/03/2022, 4:02 PM  Clinical Narrative:     Daughter made aware that recommendations are for SNF for short term rehab, agrees to placement.  Preferred facility is [facility], agrees to extend search to other facilities near [place].  FL2 completed, bed search initiated.        Expected Discharge Plan and Services         Expected Discharge Date: 09/02/22                                     Social Determinants of Health (SDOH) Interventions SDOH Screenings   Food Insecurity: No Food Insecurity (08/27/2022)  Housing: Low Risk  (08/27/2022)  Transportation Needs: No Transportation Needs (08/27/2022)  Utilities: Not At Risk (08/27/2022)  Financial Resource Strain: Low Risk  (11/12/2017)  Physical Activity: Insufficiently Active (11/12/2017)  Social Connections: Moderately Integrated (11/12/2017)  Stress: No Stress Concern Present (11/12/2017)  Tobacco Use: Medium Risk (09/01/2022)    Readmission Risk Interventions     No data to display

## 2022-09-03 NOTE — Progress Notes (Signed)
PROGRESS NOTE    Cynthia Dean   [id number] DOB: 09-Aug-1950  DOA: 08/26/2022 Date of Service: 09/03/22 PCP: Lowella Bandy, MD     Brief Narrative / Hospital Course:  Cynthia Dean is a 72 y.o. female with medical history significant for ESRD on HD MWF, insulin requiring T2DM, COPD on home 4 L home O2, CAD, carotid stenosis, seizure disorder, HTN, prior CVA, GERD, GI bleeding 2020 with unremarkable EGD and colonoscopy, left breast ca s/p partial mastectomy and left axillary dissection on 0000000 complicated by left breast abscess with coag negative staph bacteremia as well as small bilateral PE 03/20/2022, currently on Eliquis, who was sent from dialysis to the ED 08/26/2022 for evaluation of hemoglobin of 5.7, down from baseline of around 7.6-8.3.  02/02 (+)guaiac stool in ED.  Transfused 3 units PRBC.  EGD on 02/04 no concerns. Continued on po protonix.  colonoscopy 02/07 (had been delayed d/t weekend and to dialysis schedule) (+)BRB, diverticular disease, no active bleed noted. GI suspect she had diverticular bleed. 09/01/22 Hgb 7.0, receiving another unit PRBC at HD.  Repeat HD morning, 02/09, CBC shows improved Hgb. Spoke w/ daughter and patient, all questions answered, confirmed stopping Eliquis - pt was set to discharge but then unable to stand up to get dressed, d/c cancelled and PT/OT to see if able to qualify for HH/SNF 02/10 PT recs for SNF, [facility] consulted   Consultants:  Nephrology Gastroenterology   Procedures: EGD 08/28/22 Colonoscopy 08/31/22      ASSESSMENT & PLAN:   Principal Problem:   Acute on chronic blood loss anemia Active Problems:   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-stage renal disease) ([place])   History of lower GI bleeding   Chronic anticoagulation   History of pulmonary embolism 03/20/2022   ESRD on hemodialysis (HCC)   Chronic diastolic CHF (congestive heart failure) ([place])   CAD (coronary artery disease)   Essential hypertension   COPD  (chronic obstructive pulmonary disease) ([place])   Chronic respiratory failure with hypoxia (HCC)   Depression   Seizure disorder ([place])   History of CVA (cerebrovascular accident)   Insulin-requiring or dependent type II diabetes mellitus ([place])   Breast cancer, left S/P partial mastectomy,03/04/2022   Hemoglobin drop   Acute on chronic blood loss anemia Anemia of chronic kidney disease Chronic anticoagulation with Eliquis - discontinued in shared decision making w/ her daughter  History of rectal bleeding 01/2019 with unremarkable EGD and colonoscopy baseline hemoglobin around 8.0 s/p total 4u pRBC this admission EGD, unremarkable Colonoscopy, diverticular disease, BRB, likely diverticular bleed  Discontinuing Eliquis (taking for PE since August 2023) Monitor CBC    Chronic anticoagulation History of PE 03/20/2022 S/p partial mastectomy for left breast cancer 03/04/2022 Discontinuing Eliquis in the setting of GI bleeding and adeuqate treatment for primary VTE   Chronic diastolic CHF (congestive heart failure) ([place]) Clinically euvolemic cont home torsemide   ESRD on hemodialysis [facility]) Nephrology following Dialysis as scheduled    CAD (coronary artery disease) Continue aspirin and atorvastatin   Essential hypertension Continue metoprolol and torsemide cont home losartan nightly   COPD (chronic obstructive pulmonary disease) ([place]) Chronic respiratory failure on 4L O2 Not currently exacerbated Continue home inhalers and baseline oxygen   Depression Continue sertraline and trazodone   Seizure disorder ([place]) Continue Keppra   Breast cancer, left S/P partial mastectomy 03/04/2022 Not followed by oncology.  No oncology follow up scheduled currently - her oncology team reached out for follow up but family declined. Patient's lumpectomy was  complicated by abscess and staph bacteremia Patient says she does not think she wants to pursue any further treatment   Insulin-requiring  or dependent type II diabetes mellitus ([place]) recent A1c 7.0 SSI achs   History of CVA (cerebrovascular accident) Continue aspirin and atorvastatin    DVT prophylaxis: SCD/compression hose  Pertinent IV fluids/nutrition: none Central lines / invasive devices: none  Code Status: FULL CODE  Current Admission Status: inpatient  Med/Surg  TOC needs / Dispo plan: SNF Barriers to discharge / significant pending items: placement              Subjective / Brief ROS:  Patient reports abdominal pain has resolved Denies CP/SOB.  Pain controlled.  Denies new weakness.  Tolerating diet.  Reports no concerns w/ urination/defecation.   Family Communication: as above spoke today w/ daughter     Objective Findings:  Vitals:   09/02/22 2137 09/02/22 2156 09/03/22 0448 09/03/22 0754  BP: (!) 133/58 (!) 121/55 (!) 150/63 (!) 151/64  Pulse: 76 76 73 70  Resp: 17 18 15   $ Temp: 99.3 F (37.4 C) 99.6 F (37.6 C) 98.9 F (37.2 C) 97.7 F (36.5 C)  TempSrc: Oral Oral Oral   SpO2: 96% 98% 91% 99%  Weight:      Height:        Intake/Output Summary (Last 24 hours) at 09/03/2022 1340 Last data filed at 09/02/2022 2142 Gross per 24 hour  Intake 196.92 ml  Output 200 ml  Net -3.08 ml   Filed Weights   09/01/22 1420 09/02/22 0816 09/02/22 1211  Weight: 105.7 kg 105 kg 104.3 kg    Examination:  Physical Exam Constitutional:      General: She is not in acute distress.    Appearance: Normal appearance. She is obese.  Cardiovascular:     Rate and Rhythm: Normal rate and regular rhythm.  Pulmonary:     Effort: Pulmonary effort is normal.     Breath sounds: Normal breath sounds.  Abdominal:     Palpations: Abdomen is soft.  Neurological:     General: No focal deficit present.     Mental Status: She is alert. Mental status is at baseline.  Psychiatric:        Mood and Affect: Mood normal.        Behavior: Behavior normal.          Scheduled Medications:   sodium  chloride   Intravenous Once   sodium chloride   Intravenous Once   sodium chloride   Intravenous Once   aspirin EC  81 mg Oral Daily   atorvastatin  80 mg Oral QPM   Chlorhexidine Gluconate Cloth  6 each Topical Q0600   cinacalcet  30 mg Oral Q breakfast   epoetin (EPOGEN/PROCRIT) injection  4,000 Units Intravenous Q M,W,F-HD   gabapentin  100 mg Oral BID   insulin aspart  0-6 Units Subcutaneous TID WC   Ipratropium-Albuterol  1 puff Inhalation Q6H   levETIRAcetam  1,000 mg Oral Daily   levETIRAcetam  250 mg Oral Once per day on Mon Wed Fri   losartan  100 mg Oral QHS   metoprolol succinate  100 mg Oral Daily   pantoprazole  40 mg Oral Daily   sertraline  25 mg Oral Daily   torsemide  100 mg Oral Daily   traZODone  50 mg Oral QHS    Continuous Infusions:  anticoagulant sodium citrate      PRN Medications:  acetaminophen **OR** acetaminophen,  albuterol, alteplase, anticoagulant sodium citrate, heparin, HYDROcodone-acetaminophen, lidocaine (PF), lidocaine-prilocaine, pentafluoroprop-tetrafluoroeth, polyethylene glycol-electrolytes  Antimicrobials from admission:  Anti-infectives (From admission, onward)    None           Data Reviewed:  I have personally reviewed the following...  CBC: Recent Labs  Lab 08/29/22 0506 08/30/22 0456 08/31/22 0413 09/01/22 0353 09/02/22 0529  WBC 7.7 9.4 8.5 7.9 9.3  HGB 7.0* 7.7* 7.5* 7.0* 7.9*  HCT 22.4* 24.1* 24.0* 22.4* 24.6*  MCV 84.2 84.3 85.4 85.5 84.5  PLT 159 156 175 167 [id number]   Basic Metabolic Panel: Recent Labs  Lab 08/29/22 0506 08/30/22 0456 08/31/22 0413 09/01/22 0353 09/02/22 0529  NA 137 137 138 135 133*  K 4.8 3.8 4.3 4.8 4.3  CL 102 98 102 99 98  CO2 21* 27 26 26 27  $ GLUCOSE 110* 149* 104* 104* 151*  BUN 62* 35* 43* 53* 44*  CREATININE 9.05* 6.03* 7.53* 9.13* 7.43*  CALCIUM 8.2* 7.8* 8.3* 8.2* 8.0*  MG 1.8 1.8 1.8 1.7 1.7  PHOS 5.5*  --   --   --   --    GFR: Estimated Creatinine Clearance: 8.9  mL/min (A) (by C-G formula based on SCr of 7.43 mg/dL (H)). Liver Function Tests: No results for input(s): "AST", "ALT", "ALKPHOS", "BILITOT", "PROT", "ALBUMIN" in the last 168 hours.  No results for input(s): "LIPASE", "AMYLASE" in the last 168 hours. No results for input(s): "AMMONIA" in the last 168 hours. Coagulation Profile: No results for input(s): "INR", "PROTIME" in the last 168 hours. Cardiac Enzymes: No results for input(s): "CKTOTAL", "CKMB", "CKMBINDEX", "TROPONINI" in the last 168 hours. BNP (last 3 results) No results for input(s): "PROBNP" in the last 8760 hours. HbA1C: No results for input(s): "HGBA1C" in the last 72 hours. CBG: Recent Labs  Lab 09/02/22 1708 09/02/22 2030 09/03/22 0451 09/03/22 0756 09/03/22 1214  GLUCAP 157* 177* 156* 150* 147*   Lipid Profile: No results for input(s): "CHOL", "HDL", "LDLCALC", "TRIG", "CHOLHDL", "LDLDIRECT" in the last 72 hours. Thyroid Function Tests: No results for input(s): "TSH", "T4TOTAL", "FREET4", "T3FREE", "THYROIDAB" in the last 72 hours. Anemia Panel: No results for input(s): "VITAMINB12", "FOLATE", "FERRITIN", "TIBC", "IRON", "RETICCTPCT" in the last 72 hours. Most Recent Urinalysis On File:     Component Value Date/Time   COLORURINE YELLOW (A) 03/20/2022 0125   APPEARANCEUR CLEAR (A) 03/20/2022 0125   APPEARANCEUR Clear 06/14/2014 1510   LABSPEC 1.010 03/20/2022 0125   LABSPEC 1.006 06/14/2014 1510   PHURINE 7.0 03/20/2022 0125   GLUCOSEU 50 (A) 03/20/2022 0125   GLUCOSEU >=500 06/14/2014 1510   HGBUR MODERATE (A) 03/20/2022 0125   BILIRUBINUR NEGATIVE 03/20/2022 0125   BILIRUBINUR Negative 06/14/2014 1510   KETONESUR NEGATIVE 03/20/2022 0125   PROTEINUR 100 (A) 03/20/2022 0125   NITRITE NEGATIVE 03/20/2022 0125   LEUKOCYTESUR NEGATIVE 03/20/2022 0125   LEUKOCYTESUR Negative 06/14/2014 1510   Sepsis Labs: @LABRCNTIP$ (procalcitonin:4,lacticidven:4) Microbiology: Recent Results (from the past 240  hour(s))  MRSA Next Gen by PCR, Nasal     Status: None   Collection Time: 08/28/22  4:20 PM   Specimen: Nasal Mucosa; Nasal Swab  Result Value Ref Range Status   MRSA by PCR Next Gen NOT DETECTED NOT DETECTED Final    Comment: (NOTE) The GeneXpert MRSA Assay (FDA approved for NASAL specimens only), is one component of a comprehensive MRSA colonization surveillance program. It is not intended to diagnose MRSA infection nor to guide or monitor treatment for MRSA infections. Test performance is not  FDA approved in patients less than 3 years old. Performed at [facility], [address] [place], [place] [address]   Gastrointestinal Panel by PCR , Stool     Status: None   Collection Time: 08/29/22  3:44 PM   Specimen: Stool  Result Value Ref Range Status   Campylobacter species NOT DETECTED NOT DETECTED Final   Plesimonas shigelloides NOT DETECTED NOT DETECTED Final   Salmonella species NOT DETECTED NOT DETECTED Final   Yersinia enterocolitica NOT DETECTED NOT DETECTED Final   Vibrio species NOT DETECTED NOT DETECTED Final   Vibrio cholerae NOT DETECTED NOT DETECTED Final   Enteroaggregative E coli (EAEC) NOT DETECTED NOT DETECTED Final   Enteropathogenic E coli (EPEC) NOT DETECTED NOT DETECTED Final   Enterotoxigenic E coli (ETEC) NOT DETECTED NOT DETECTED Final   Shiga like toxin producing E coli (STEC) NOT DETECTED NOT DETECTED Final   Shigella/Enteroinvasive E coli (EIEC) NOT DETECTED NOT DETECTED Final   Cryptosporidium NOT DETECTED NOT DETECTED Final   Cyclospora cayetanensis NOT DETECTED NOT DETECTED Final   Entamoeba histolytica NOT DETECTED NOT DETECTED Final   Giardia lamblia NOT DETECTED NOT DETECTED Final   Adenovirus F40/41 NOT DETECTED NOT DETECTED Final   Astrovirus NOT DETECTED NOT DETECTED Final   Norovirus GI/GII NOT DETECTED NOT DETECTED Final   Rotavirus A NOT DETECTED NOT DETECTED Final   Sapovirus (I, II, IV, and V) NOT DETECTED NOT DETECTED Final     Comment: Performed at [facility], [address] [place],  [address]      Radiology Studies last 3 days: No results found.           LOS: 6 days      Emeterio Reeve, DO [facility] 09/03/2022, 1:40 PM    Dictation software may have been used to generate the above note. Typos may occur and escape review in typed/dictated notes. Please contact Dr Sheppard Coil directly for clarity if needed.  Staff may message me via secure chat in [place]  but this may not receive an immediate response,  please page me for urgent matters!  If 7PM-7AM, please contact night coverage [email or web address]

## 2022-09-03 NOTE — NC FL2 (Signed)
[employer] LEVEL OF CARE FORM     IDENTIFICATION  Patient Name: Cynthia Dean Birthdate: 04/13/1951 Sex: female Admission Date (Current Location): 08/26/2022  [facility] and [place] Number:  Engineering geologist and Address:  [facility], [address], [place], [place] [address]      Provider Number: [id number]  Attending Physician Name and Address:  Emeterio Reeve, DO  Relative Name and Phone Number:  Chauncy Dondlinger [phone]    Current Level of Care: Hospital Recommended Level of Care: [place] Prior Approval Number:    Date Approved/Denied:   PASRR Number: [id number]  Discharge Plan: SNF    Current Diagnoses: Patient Active Problem List   Diagnosis Date Noted   Hemoglobin drop 08/28/2022   Chronic respiratory failure with hypoxia ([place]) 08/26/2022   Acute on chronic blood loss anemia 08/26/2022   History of lower GI bleeding 08/26/2022   Insulin-requiring or dependent type II diabetes mellitus ([place]) 08/26/2022   Chronic anticoagulation 08/26/2022   History of pulmonary embolism 03/20/2022 08/26/2022   Breast cancer, left S/P partial mastectomy,03/04/2022 08/26/2022   Generalized weakness    Unable to ambulate 05/09/2022   ESRD on dialysis [facility])    Right leg pain    Depression    Pulmonary emboli (HCC)    Obesity with body mass index (BMI) of 30.0 to 39.9    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-stage renal disease) (Cynthia Dean)    Chronic diastolic CHF (congestive heart failure) (HCC)    Seroma of breast    Bacteremia due to coagulase-negative Staphylococcus    Left breast abscess    Urinary tract infection 03/22/2022   Anemia of chronic disease 03/22/2022   Acute pulmonary embolism without acute cor pulmonale (Cynthia Dean) 03/20/2022   Dyslipidemia 03/20/2022   Diabetic neuropathy ([place]) 03/20/2022   Sepsis due to cellulitis (Cynthia Dean) 03/20/2022   Delay kidney tx func d/t fluid overload requiring acute dialysis (Cynthia Dean)  01/29/2020   Delay kidney tx func d/t ATN and fluid overload require acute dialysis (Cynthia Dean) 01/29/2020   Hypoglycemia 10/21/2019   Unresponsiveness 10/21/2019   GERD (gastroesophageal reflux disease) 10/21/2019   Hypothermia    Sepsis with encephalopathy without septic shock (HCC)    Diarrhea    Acute metabolic encephalopathy    Acute on chronic diastolic CHF (congestive heart failure) (Cynthia Dean)    Acute on chronic respiratory failure with hypoxia (Cynthia Dean) 07/30/2019   Rectal bleed 02/13/2019   Acute respiratory failure with hypoxia ([place]) 12/31/2018   History of CVA (cerebrovascular accident) 05/29/2018   Aphasia as late effect of cerebrovascular accident (CVA) 05/01/2018   Seizure disorder ([place]) 03/14/2018   Stroke ([place]) 03/07/2018   Slurred speech 11/14/2017   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Cynthia Dean) 11/12/2017   CAD (coronary artery disease) 11/12/2017   COPD (chronic obstructive pulmonary disease) (Cynthia Dean) 11/12/2017   ESRD on hemodialysis (Cynthia Dean) 11/12/2017   CVA (cerebral vascular accident) (Cynthia Dean) 04/14/2016   Essential hypertension 11/27/2015   Hyperlipidemia 11/27/2015   Prolonged Q-T interval on ECG [id number]   Aphasia complicating stroke [id number]   Cervical spine arthritis 07/27/2015   Diabetic retinopathy without macular edema associated with type 2 diabetes mellitus (Cynthia Dean) 07/27/2015   Osteoarthritis of spine with radiculopathy, lumbar region 07/27/2015   Obesity, Class III, BMI 40-49.9 (morbid obesity) (Cynthia Dean) 07/27/2015   Vitamin D deficiency [id number]   Diastolic heart failure (Cynthia Dean) 10/27/2010    Orientation RESPIRATION BLADDER Height & Weight     Self, Place  Normal  Incontinent Weight: 104.3 kg Height:  5' 9"$  (175.3 cm)  BEHAVIORAL SYMPTOMS/MOOD NEUROLOGICAL BOWEL NUTRITION STATUS      Continent Diet  AMBULATORY STATUS COMMUNICATION OF NEEDS Skin   Limited Assist Verbally Normal                       Personal Care Assistance Level of  Assistance  Bathing, Dressing Bathing Assistance: Limited assistance   Dressing Assistance: Limited assistance     Functional Limitations Info             SPECIAL CARE FACTORS FREQUENCY  PT (By licensed PT), OT (By licensed OT)     PT Frequency: 5 times a week OT Frequency: 5 times a week            Contractures Contractures Info: Not present    Additional Factors Info  Code Status, Allergies Code Status Info: Full Allergies Info: Oxycodone Not Specified  Nausea And Vomiting  Oxycodone-acetaminophen Not Specified  Other (See Comments)  Wound Dressing Adhesive Not Specified    Hydrocodone Low   Intolerant more than allergic Tape Low  Itching Skin Dermatitis/itching (tape adhesive) Skin Dermatitis/itching (tape adhesive) Tapentadol           Current Medications (09/03/2022):  This is the current hospital active medication list Current Facility-Administered Medications  Medication Dose Route Frequency Provider Last Rate Last Admin   0.9 %  sodium chloride infusion (Manually program via Guardrails IV Fluids)   Intravenous Once Athena Masse, MD       0.9 %  sodium chloride infusion (Manually program via Guardrails IV Fluids)   Intravenous Once Enzo Bi, MD       0.9 %  sodium chloride infusion (Manually program via Guardrails IV Fluids)   Intravenous Once Emeterio Reeve, DO       acetaminophen (TYLENOL) tablet 650 mg  650 mg Oral Q6H PRN Athena Masse, MD   650 mg at 08/30/22 0447   Or   acetaminophen (TYLENOL) suppository 650 mg  650 mg Rectal Q6H PRN Athena Masse, MD       albuterol (PROVENTIL) (2.5 MG/3ML) 0.083% nebulizer solution 2.5 mg  2.5 mg Nebulization Q2H PRN Athena Masse, MD       alteplase (CATHFLO ACTIVASE) injection 2 mg  2 mg Intracatheter Once PRN Colon Flattery, NP       anticoagulant sodium citrate solution 5 mL  5 mL Intracatheter PRN Colon Flattery, NP       aspirin EC tablet 81 mg  81 mg Oral Daily Judd Gaudier V, MD   81 mg at  09/03/22 1029   atorvastatin (LIPITOR) tablet 80 mg  80 mg Oral QPM Athena Masse, MD   80 mg at 09/02/22 1710   Chlorhexidine Gluconate Cloth 2 % PADS 6 each  6 each Topical Q0600 Colon Flattery, NP   6 each at 09/03/22 0516   cinacalcet (SENSIPAR) tablet 30 mg  30 mg Oral Q breakfast Judd Gaudier V, MD   30 mg at 09/03/22 1029   epoetin alfa (EPOGEN) injection 4,000 Units  4,000 Units Intravenous Q M,W,F-HD Murlean Iba, MD   4,000 Units at 09/02/22 1157   gabapentin (NEURONTIN) capsule 100 mg  100 mg Oral BID Emeterio Reeve, DO   100 mg at 09/03/22 1029   heparin injection 1,000 Units  1,000 Units Intracatheter PRN Colon Flattery, NP       HYDROcodone-acetaminophen (NORCO/VICODIN) 5-325 MG per tablet 1-2 tablet  1-2 tablet Oral Q4H PRN Athena Masse, MD   2 tablet at 09/02/22 1423   insulin aspart (novoLOG) injection 0-6 Units  0-6 Units Subcutaneous TID WC Emeterio Reeve, DO       Ipratropium-Albuterol (COMBIVENT) respimat 1 puff  1 puff Inhalation Q6H Athena Masse, MD   1 puff at 09/03/22 1029   levETIRAcetam (KEPPRA) tablet 1,000 mg  1,000 mg Oral Daily Judd Gaudier V, MD   1,000 mg at 09/03/22 1029   levETIRAcetam (KEPPRA) tablet 250 mg  250 mg Oral Once per day on Mon Wed Fri Duncan, Hazel V, MD   250 mg at 09/02/22 1424   lidocaine (PF) (XYLOCAINE) 1 % injection 5 mL  5 mL Intradermal PRN Colon Flattery, NP       lidocaine-prilocaine (EMLA) cream 1 Application  1 Application Topical PRN Colon Flattery, NP       losartan (COZAAR) tablet 100 mg  100 mg Oral QHS Enzo Bi, MD   100 mg at 09/02/22 2142   metoprolol succinate (TOPROL-XL) 24 hr tablet 100 mg  100 mg Oral Daily Judd Gaudier V, MD   100 mg at 09/03/22 1029   pantoprazole (PROTONIX) EC tablet 40 mg  40 mg Oral Daily Athena Masse, MD   40 mg at 09/03/22 1029   pentafluoroprop-tetrafluoroeth (GEBAUERS) aerosol 1 Application  1 Application Topical PRN Colon Flattery, NP       polyethylene  glycol-electrolytes (NuLYTELY) solution 2,000 mL  2,000 mL Oral Once PRN Annamaria Helling, DO       sertraline (ZOLOFT) tablet 25 mg  25 mg Oral Daily Enzo Bi, MD   25 mg at 09/03/22 1029   torsemide (DEMADEX) tablet 100 mg  100 mg Oral Daily Judd Gaudier V, MD   100 mg at 09/03/22 1028   traZODone (DESYREL) tablet 50 mg  50 mg Oral QHS Athena Masse, MD   50 mg at 09/02/22 2142     Discharge Medications: Please see discharge summary for a list of discharge medications.  Relevant Imaging Results:  Relevant Lab Results:   Additional Information SS#: [id number]. HD MWF [place] 6:00 am.  Valente David, RN

## 2022-09-03 NOTE — Plan of Care (Signed)

## 2022-09-03 NOTE — Progress Notes (Signed)
[facility]  PROGRESS NOTE   Subjective:   Patient had stable dialysis yesterday.  Feels much better today.  Objective:  Vital signs: Blood pressure (!) 151/64, pulse 70, temperature 97.7 F (36.5 C), resp. rate 15, height 5' 9"$  (1.753 m), weight 104.3 kg, SpO2 99 %.  Intake/Output Summary (Last 24 hours) at 09/03/2022 1309 Last data filed at 09/02/2022 2142 Gross per 24 hour  Intake 196.92 ml  Output 200 ml  Net -3.08 ml   Filed Weights   09/01/22 1420 09/02/22 0816 09/02/22 1211  Weight: 105.7 kg 105 kg 104.3 kg     Physical Exam: General:  No acute distress  Head:  Normocephalic, atraumatic. Moist oral mucosal membranes  Eyes:  Anicteric  Neck:  Supple  Lungs:   Clear to auscultation, normal effort  Heart:  S1S2 no rubs  Abdomen:   Soft, nontender, bowel sounds present  Extremities:  peripheral edema.  Neurologic:  Awake, alert, following commands  Skin:  No lesions  Access:     Basic Metabolic Panel: Recent Labs  Lab 08/29/22 0506 08/30/22 0456 08/31/22 0413 09/01/22 0353 09/02/22 0529  NA 137 137 138 135 133*  K 4.8 3.8 4.3 4.8 4.3  CL 102 98 102 99 98  CO2 21* 27 26 26 27  $ GLUCOSE 110* 149* 104* 104* 151*  BUN 62* 35* 43* 53* 44*  CREATININE 9.05* 6.03* 7.53* 9.13* 7.43*  CALCIUM 8.2* 7.8* 8.3* 8.2* 8.0*  MG 1.8 1.8 1.8 1.7 1.7  PHOS 5.5*  --   --   --   --    GFR: Estimated Creatinine Clearance: 8.9 mL/min (A) (by C-G formula based on SCr of 7.43 mg/dL (H)).  Liver Function Tests: No results for input(s): "AST", "ALT", "ALKPHOS", "BILITOT", "PROT", "ALBUMIN" in the last 168 hours. No results for input(s): "LIPASE", "AMYLASE" in the last 168 hours. No results for input(s): "AMMONIA" in the last 168 hours.  CBC: Recent Labs  Lab 08/29/22 0506 08/30/22 0456 08/31/22 0413 09/01/22 0353 09/02/22 0529  WBC 7.7 9.4 8.5 7.9 9.3  HGB 7.0* 7.7* 7.5* 7.0* 7.9*  HCT 22.4* 24.1* 24.0* 22.4* 24.6*  MCV 84.2 84.3 85.4 85.5 84.5  PLT 159  156 175 167 165     HbA1C: Hemoglobin A1C  Date/Time Value Ref Range Status  06/15/2014 12:58 AM 7.9 (H) 4.2 - 6.3 % Final    Comment:    The American Diabetes Association recommends that a primary goal of therapy should be <7% and that physicians should reevaluate the treatment regimen in patients with HbA1c values consistently >8%.   12/24/2013 11:18 AM 8.2 (H) 4.2 - 6.3 % Final    Comment:    The American Diabetes Association recommends that a primary goal of therapy should be <7% and that physicians should reevaluate the treatment regimen in patients with HbA1c values consistently >8%.    Hgb A1c MFr Bld  Date/Time Value Ref Range Status  03/23/2022 06:47 AM 5.8 (H) 4.8 - 5.6 % Final    Comment:    (NOTE) Pre diabetes:          5.7%-6.4%  Diabetes:              >6.4%  Glycemic control for   <7.0% adults with diabetes   01/29/2020 12:28 PM 7.5 (H) 4.8 - 5.6 % Final    Comment:    (NOTE)         Prediabetes: 5.7 - 6.4  Diabetes: >6.4         Glycemic control for adults with diabetes: <7.0     Urinalysis: No results for input(s): "COLORURINE", "LABSPEC", "PHURINE", "GLUCOSEU", "HGBUR", "BILIRUBINUR", "KETONESUR", "PROTEINUR", "UROBILINOGEN", "NITRITE", "LEUKOCYTESUR" in the last 72 hours.  Invalid input(s): "APPERANCEUR"    Imaging: No results found.   Medications:    anticoagulant sodium citrate      sodium chloride   Intravenous Once   sodium chloride   Intravenous Once   sodium chloride   Intravenous Once   aspirin EC  81 mg Oral Daily   atorvastatin  80 mg Oral QPM   Chlorhexidine Gluconate Cloth  6 each Topical Q0600   cinacalcet  30 mg Oral Q breakfast   epoetin (EPOGEN/PROCRIT) injection  4,000 Units Intravenous Q M,W,F-HD   gabapentin  100 mg Oral BID   Ipratropium-Albuterol  1 puff Inhalation Q6H   levETIRAcetam  1,000 mg Oral Daily   levETIRAcetam  250 mg Oral Once per day on Mon Wed Fri   losartan  100 mg Oral QHS   metoprolol  succinate  100 mg Oral Daily   pantoprazole  40 mg Oral Daily   sertraline  25 mg Oral Daily   torsemide  100 mg Oral Daily   traZODone  50 mg Oral QHS    Assessment/ Plan:     72 y.o. female with medical history significant for ESRD on HD MWF, insulin requiring T2DM, COPD on home 4 L home O2, CAD, carotid stenosis, seizure disorder, HTN, prior CVA, GERD, GI bleeding 2020 with unremarkable EGD and colonoscopy, left breast ca s/p partial mastectomy and left axillary dissection on 0000000 complicated by left breast abscess with coag negative staph bacteremia as well as small bilateral PE 03/20/2022, currently on Eliquis, who was sent from dialysis to the ED 08/26/2022 for evaluation of hemoglobin of 5.7, down from baseline of around 7.6-8.3.   #1: ESRD on hemodialysis: Had stable dialysis on Friday.  AV fistula worked well.  Did not have any fluid removed.  #2: Anemia: Present hemoglobin is 7.9.  GI workup is negative so far.  Continue anemia protocols with dialysis. Patient had blood transfusion.  #3: Secondary hyperparathyroidism: We will continue the Cinacalcet.  #4: Hypertension: Blood pressure is better now.  Will continue the metoprolol and losartan.  Will continue to follow.   LOS: [place], [facility] 2/10/20241:09 PM

## 2022-09-04 DIAGNOSIS — D62 Acute posthemorrhagic anemia: Secondary | ICD-10-CM | POA: Diagnosis not present

## 2022-09-04 LAB — GLUCOSE, CAPILLARY
Glucose-Capillary: 150 mg/dL — ABNORMAL HIGH (ref 70–99)
Glucose-Capillary: 140 mg/dL — ABNORMAL HIGH (ref 70–99)
Glucose-Capillary: 156 mg/dL — ABNORMAL HIGH (ref 70–99)
Glucose-Capillary: 182 mg/dL — ABNORMAL HIGH (ref 70–99)

## 2022-09-04 LAB — CBC
HCT: 24.1 % — ABNORMAL LOW (ref 36.0–46.0)
Hemoglobin: 7.5 g/dL — ABNORMAL LOW (ref 12.0–15.0)
MCH: 26.6 pg (ref 26.0–34.0)
MCHC: 31.1 g/dL (ref 30.0–36.0)
MCV: 85.5 fL (ref 80.0–100.0)
Platelets: 183 10*3/uL (ref 150–400)
RBC: 2.82 MIL/uL — ABNORMAL LOW (ref 3.87–5.11)
RDW: 15.9 % — ABNORMAL HIGH (ref 11.5–15.5)
WBC: 10.2 10*3/uL (ref 4.0–10.5)
nRBC: 0 % (ref 0.0–0.2)

## 2022-09-04 NOTE — Progress Notes (Signed)
[facility]  PROGRESS NOTE   Subjective:   Patient denies any chest pain, shortness of breath.  Slept well last night.  Objective:  Vital signs: Blood pressure 134/62, pulse 70, temperature 98 F (36.7 C), resp. rate 18, height 5' 9"$  (1.753 m), weight 104.3 kg, SpO2 96 %.  Intake/Output Summary (Last 24 hours) at 09/04/2022 1208 Last data filed at 09/03/2022 2213 Gross per 24 hour  Intake 120 ml  Output --  Net 120 ml   Filed Weights   09/01/22 1420 09/02/22 0816 09/02/22 1211  Weight: 105.7 kg 105 kg 104.3 kg     Physical Exam: General:  No acute distress  Head:  Normocephalic, atraumatic. Moist oral mucosal membranes  Eyes:  Anicteric  Neck:  Supple  Lungs:   Clear to auscultation, normal effort  Heart:  S1S2 no rubs  Abdomen:   Soft, nontender, bowel sounds present  Extremities:  peripheral edema.  Neurologic:  Awake, alert, following commands  Skin:  No lesions  Access:     Basic Metabolic Panel: Recent Labs  Lab 08/29/22 0506 08/30/22 0456 08/31/22 0413 09/01/22 0353 09/02/22 0529  NA 137 137 138 135 133*  K 4.8 3.8 4.3 4.8 4.3  CL 102 98 102 99 98  CO2 21* 27 26 26 27  $ GLUCOSE 110* 149* 104* 104* 151*  BUN 62* 35* 43* 53* 44*  CREATININE 9.05* 6.03* 7.53* 9.13* 7.43*  CALCIUM 8.2* 7.8* 8.3* 8.2* 8.0*  MG 1.8 1.8 1.8 1.7 1.7  PHOS 5.5*  --   --   --   --    GFR: Estimated Creatinine Clearance: 8.9 mL/min (A) (by C-G formula based on SCr of 7.43 mg/dL (H)).  Liver Function Tests: No results for input(s): "AST", "ALT", "ALKPHOS", "BILITOT", "PROT", "ALBUMIN" in the last 168 hours. No results for input(s): "LIPASE", "AMYLASE" in the last 168 hours. No results for input(s): "AMMONIA" in the last 168 hours.  CBC: Recent Labs  Lab 08/30/22 0456 08/31/22 0413 09/01/22 0353 09/02/22 0529 09/04/22 0436  WBC 9.4 8.5 7.9 9.3 10.2  HGB 7.7* 7.5* 7.0* 7.9* 7.5*  HCT 24.1* 24.0* 22.4* 24.6* 24.1*  MCV 84.3 85.4 85.5 84.5 85.5  PLT 156  175 167 165 183     HbA1C: Hemoglobin A1C  Date/Time Value Ref Range Status  06/15/2014 12:58 AM 7.9 (H) 4.2 - 6.3 % Final    Comment:    The American Diabetes Association recommends that a primary goal of therapy should be <7% and that physicians should reevaluate the treatment regimen in patients with HbA1c values consistently >8%.   12/24/2013 11:18 AM 8.2 (H) 4.2 - 6.3 % Final    Comment:    The American Diabetes Association recommends that a primary goal of therapy should be <7% and that physicians should reevaluate the treatment regimen in patients with HbA1c values consistently >8%.    Hgb A1c MFr Bld  Date/Time Value Ref Range Status  03/23/2022 06:47 AM 5.8 (H) 4.8 - 5.6 % Final    Comment:    (NOTE) Pre diabetes:          5.7%-6.4%  Diabetes:              >6.4%  Glycemic control for   <7.0% adults with diabetes   01/29/2020 12:28 PM 7.5 (H) 4.8 - 5.6 % Final    Comment:    (NOTE)         Prediabetes: 5.7 - 6.4  Diabetes: >6.4         Glycemic control for adults with diabetes: <7.0     Urinalysis: No results for input(s): "COLORURINE", "LABSPEC", "PHURINE", "GLUCOSEU", "HGBUR", "BILIRUBINUR", "KETONESUR", "PROTEINUR", "UROBILINOGEN", "NITRITE", "LEUKOCYTESUR" in the last 72 hours.  Invalid input(s): "APPERANCEUR"    Imaging: No results found.   Medications:    anticoagulant sodium citrate      aspirin EC  81 mg Oral Daily   atorvastatin  80 mg Oral QPM   Chlorhexidine Gluconate Cloth  6 each Topical Q0600   cinacalcet  30 mg Oral Q breakfast   epoetin (EPOGEN/PROCRIT) injection  4,000 Units Intravenous Q M,W,F-HD   gabapentin  100 mg Oral BID   insulin aspart  0-6 Units Subcutaneous TID WC   Ipratropium-Albuterol  1 puff Inhalation Q6H   levETIRAcetam  1,000 mg Oral Daily   levETIRAcetam  250 mg Oral Once per day on Mon Wed Fri   losartan  100 mg Oral QHS   metoprolol succinate  100 mg Oral Daily   pantoprazole  40 mg Oral Daily    sertraline  25 mg Oral Daily   torsemide  100 mg Oral Daily   traZODone  50 mg Oral QHS    Assessment/ Plan:     72 y.o. female with medical history significant for ESRD on HD MWF, insulin requiring T2DM, COPD on home 4 L home O2, CAD, carotid stenosis, seizure disorder, HTN, prior CVA, GERD, GI bleeding 2020 with unremarkable EGD and colonoscopy, left breast ca s/p partial mastectomy and left axillary dissection on 0000000 complicated by left breast abscess with coag negative staph bacteremia as well as small bilateral PE 03/20/2022, currently on Eliquis, who was sent from dialysis to the ED 08/26/2022 for evaluation of hemoglobin of 5.7, down from baseline of around 7.6-8.3.    #1: ESRD on hemodialysis: Had stable dialysis on Friday.  AV fistula worked well.  Did not have any fluid removed.  Will schedule for dialysis tomorrow.   #2: Anemia: Present hemoglobin is 7.9.  GI workup is negative so far.  Continue anemia protocols with dialysis. Patient had blood transfusion.   #3: Secondary hyperparathyroidism: We will continue the Cinacalcet.   #4: Hypertension: Blood pressure is better now.  Will continue the metoprolol and losartan.   Will continue to follow.  Discharge planning in progress.    LOS: [place], [facility] 2/11/202412:08 PM

## 2022-09-04 NOTE — Plan of Care (Signed)

## 2022-09-04 NOTE — Progress Notes (Signed)
PROGRESS NOTE    Cynthia Dean   [id number] DOB: 1951-02-26  DOA: 08/26/2022 Date of Service: 09/04/22 PCP: Lowella Bandy, MD     Brief Narrative / Hospital Course:  Cynthia Dean is a 72 y.o. female with medical history significant for ESRD on HD MWF, insulin requiring T2DM, COPD on home 4 L home O2, CAD, carotid stenosis, seizure disorder, HTN, prior CVA, GERD, GI bleeding 2020 with unremarkable EGD and colonoscopy, left breast ca s/p partial mastectomy and left axillary dissection on 0000000 complicated by left breast abscess with coag negative staph bacteremia as well as small bilateral PE 03/20/2022, currently on Eliquis, who was sent from dialysis to the ED 08/26/2022 for evaluation of hemoglobin of 5.7, down from baseline of around 7.6-8.3.  02/02 (+)guaiac stool in ED.  Transfused 3 units PRBC.  EGD on 02/04 no concerns. Continued on po protonix.  colonoscopy 02/07 (had been delayed d/t weekend and to dialysis schedule) (+)BRB, diverticular disease, no active bleed noted. GI suspect she had diverticular bleed. 09/01/22 Hgb 7.0, receiving another unit PRBC at HD.  Repeat HD morning, 02/09, CBC shows improved Hgb. Spoke w/ daughter and patient, all questions answered, confirmed stopping Eliquis - pt was set to discharge but then unable to stand up to get dressed, d/c cancelled and PT/OT to see if able to qualify for HH/SNF 02/10 PT recs for SNF, TOC consulted. 02/11 (Sunday) pending placement   Consultants:  Nephrology Gastroenterology   Procedures: EGD 08/28/22 Colonoscopy 08/31/22      ASSESSMENT & PLAN:   Principal Problem:   Acute on chronic blood loss anemia Active Problems:   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-stage renal disease) ([place])   History of lower GI bleeding   Chronic anticoagulation   History of pulmonary embolism 03/20/2022   ESRD on hemodialysis (HCC)   Chronic diastolic CHF (congestive heart failure) (HCC)   CAD (coronary artery  disease)   Essential hypertension   COPD (chronic obstructive pulmonary disease) (HCC)   Chronic respiratory failure with hypoxia (HCC)   Depression   Seizure disorder (HCC)   History of CVA (cerebrovascular accident)   Insulin-requiring or dependent type II diabetes mellitus ([place])   Breast cancer, left S/P partial mastectomy,03/04/2022   Hemoglobin drop   Acute on chronic blood loss anemia Anemia of chronic kidney disease Chronic anticoagulation with Eliquis - discontinued in shared decision making w/ her daughter  History of rectal bleeding 01/2019 with unremarkable EGD and colonoscopy baseline hemoglobin around 8.0 s/p total 4u pRBC this admission EGD, unremarkable Colonoscopy, diverticular disease, BRB, likely diverticular bleed  Discontinuing Eliquis (taking for PE since August 2023) Monitor CBC    Chronic anticoagulation History of PE 03/20/2022 S/p partial mastectomy for left breast cancer 03/04/2022 Discontinuing Eliquis in the setting of GI bleeding and adeuqate treatment for primary VTE   Chronic diastolic CHF (congestive heart failure) (HCC) Clinically euvolemic cont home torsemide   ESRD on hemodialysis [facility]) Nephrology following Dialysis as scheduled    CAD (coronary artery disease) Continue aspirin and atorvastatin   Essential hypertension Continue metoprolol and torsemide cont home losartan nightly   COPD (chronic obstructive pulmonary disease) ([place]) Chronic respiratory failure on 4L O2 Not currently exacerbated Continue home inhalers and baseline oxygen   Depression Continue sertraline and trazodone   Seizure disorder ([place]) Continue Keppra   Breast cancer, left S/P partial mastectomy 03/04/2022 Not followed by oncology.  No oncology follow up scheduled currently - her oncology team reached out for follow up but family  declined. Patient's lumpectomy was complicated by abscess and staph bacteremia Patient says she does not think she wants to pursue  any further treatment   Insulin-requiring or dependent type II diabetes mellitus ([place]) recent A1c 7.0 SSI achs   History of CVA (cerebrovascular accident) Continue aspirin and atorvastatin    DVT prophylaxis: SCD/compression hose  Pertinent IV fluids/nutrition: none Central lines / invasive devices: none  Code Status: FULL CODE  Current Admission Status: inpatient  Med/Surg  TOC needs / Dispo plan: SNF Barriers to discharge / significant pending items: placement              Subjective / Brief ROS:  Patient reports no concerns today  Denies CP/SOB.  Pain controlled. Tolerating diet.    Family Communication: none at this time     Objective Findings:  Vitals:   09/03/22 1709 09/03/22 2134 09/04/22 0559 09/04/22 0829  BP: (!) 146/67 139/61 (!) 111/58 134/62  Pulse: 68 70 77 70  Resp:  16 18 18  $ Temp: 97.7 F (36.5 C) 98.6 F (37 C) 99.1 F (37.3 C) 98 F (36.7 C)  TempSrc:  Oral Oral   SpO2: 98% 96% 91% 96%  Weight:      Height:        Intake/Output Summary (Last 24 hours) at 09/04/2022 1114 Last data filed at 09/03/2022 2213 Gross per 24 hour  Intake 120 ml  Output --  Net 120 ml   Filed Weights   09/01/22 1420 09/02/22 0816 09/02/22 1211  Weight: 105.7 kg 105 kg 104.3 kg    Examination:  Physical Exam Constitutional:      General: She is not in acute distress.    Appearance: Normal appearance. She is obese.  Cardiovascular:     Rate and Rhythm: Normal rate and regular rhythm.  Pulmonary:     Effort: Pulmonary effort is normal.     Breath sounds: Normal breath sounds.  Abdominal:     Palpations: Abdomen is soft.  Neurological:     General: No focal deficit present.     Mental Status: She is alert. Mental status is at baseline.  Psychiatric:        Mood and Affect: Mood normal.        Behavior: Behavior normal.          Scheduled Medications:   aspirin EC  81 mg Oral Daily   atorvastatin  80 mg Oral QPM   Chlorhexidine  Gluconate Cloth  6 each Topical Q0600   cinacalcet  30 mg Oral Q breakfast   epoetin (EPOGEN/PROCRIT) injection  4,000 Units Intravenous Q M,W,F-HD   gabapentin  100 mg Oral BID   insulin aspart  0-6 Units Subcutaneous TID WC   Ipratropium-Albuterol  1 puff Inhalation Q6H   levETIRAcetam  1,000 mg Oral Daily   levETIRAcetam  250 mg Oral Once per day on Mon Wed Fri   losartan  100 mg Oral QHS   metoprolol succinate  100 mg Oral Daily   pantoprazole  40 mg Oral Daily   sertraline  25 mg Oral Daily   torsemide  100 mg Oral Daily   traZODone  50 mg Oral QHS    Continuous Infusions:  anticoagulant sodium citrate      PRN Medications:  acetaminophen **OR** acetaminophen, albuterol, alteplase, anticoagulant sodium citrate, heparin, HYDROcodone-acetaminophen, lidocaine (PF), lidocaine-prilocaine, pentafluoroprop-tetrafluoroeth, polyethylene glycol-electrolytes  Antimicrobials from admission:  Anti-infectives (From admission, onward)    None  Data Reviewed:  I have personally reviewed the following...  CBC: Recent Labs  Lab 08/30/22 0456 08/31/22 0413 09/01/22 0353 09/02/22 0529 09/04/22 0436  WBC 9.4 8.5 7.9 9.3 10.2  HGB 7.7* 7.5* 7.0* 7.9* 7.5*  HCT 24.1* 24.0* 22.4* 24.6* 24.1*  MCV 84.3 85.4 85.5 84.5 85.5  PLT 156 175 167 165 [id number]   Basic Metabolic Panel: Recent Labs  Lab 08/29/22 0506 08/30/22 0456 08/31/22 0413 09/01/22 0353 09/02/22 0529  NA 137 137 138 135 133*  K 4.8 3.8 4.3 4.8 4.3  CL 102 98 102 99 98  CO2 21* 27 26 26 27  $ GLUCOSE 110* 149* 104* 104* 151*  BUN 62* 35* 43* 53* 44*  CREATININE 9.05* 6.03* 7.53* 9.13* 7.43*  CALCIUM 8.2* 7.8* 8.3* 8.2* 8.0*  MG 1.8 1.8 1.8 1.7 1.7  PHOS 5.5*  --   --   --   --    GFR: Estimated Creatinine Clearance: 8.9 mL/min (A) (by C-G formula based on SCr of 7.43 mg/dL (H)). Liver Function Tests: No results for input(s): "AST", "ALT", "ALKPHOS", "BILITOT", "PROT", "ALBUMIN" in the last 168  hours.  No results for input(s): "LIPASE", "AMYLASE" in the last 168 hours. No results for input(s): "AMMONIA" in the last 168 hours. Coagulation Profile: No results for input(s): "INR", "PROTIME" in the last 168 hours. Cardiac Enzymes: No results for input(s): "CKTOTAL", "CKMB", "CKMBINDEX", "TROPONINI" in the last 168 hours. BNP (last 3 results) No results for input(s): "PROBNP" in the last 8760 hours. HbA1C: No results for input(s): "HGBA1C" in the last 72 hours. CBG: Recent Labs  Lab 09/03/22 0756 09/03/22 1214 09/03/22 1711 09/03/22 2131 09/04/22 0818  GLUCAP 150* 147* 130* 139* 150*   Lipid Profile: No results for input(s): "CHOL", "HDL", "LDLCALC", "TRIG", "CHOLHDL", "LDLDIRECT" in the last 72 hours. Thyroid Function Tests: No results for input(s): "TSH", "T4TOTAL", "FREET4", "T3FREE", "THYROIDAB" in the last 72 hours. Anemia Panel: No results for input(s): "VITAMINB12", "FOLATE", "FERRITIN", "TIBC", "IRON", "RETICCTPCT" in the last 72 hours. Most Recent Urinalysis On File:     Component Value Date/Time   COLORURINE YELLOW (A) 03/20/2022 0125   APPEARANCEUR CLEAR (A) 03/20/2022 0125   APPEARANCEUR Clear 06/14/2014 1510   LABSPEC 1.010 03/20/2022 0125   LABSPEC 1.006 06/14/2014 1510   PHURINE 7.0 03/20/2022 0125   GLUCOSEU 50 (A) 03/20/2022 0125   GLUCOSEU >=500 06/14/2014 1510   HGBUR MODERATE (A) 03/20/2022 0125   BILIRUBINUR NEGATIVE 03/20/2022 0125   BILIRUBINUR Negative 06/14/2014 1510   KETONESUR NEGATIVE 03/20/2022 0125   PROTEINUR 100 (A) 03/20/2022 0125   NITRITE NEGATIVE 03/20/2022 0125   LEUKOCYTESUR NEGATIVE 03/20/2022 0125   LEUKOCYTESUR Negative 06/14/2014 1510   Sepsis Labs: @LABRCNTIP$ (procalcitonin:4,lacticidven:4) Microbiology: Recent Results (from the past 240 hour(s))  MRSA Next Gen by PCR, Nasal     Status: None   Collection Time: 08/28/22  4:20 PM   Specimen: Nasal Mucosa; Nasal Swab  Result Value Ref Range Status   MRSA by PCR Next  Gen NOT DETECTED NOT DETECTED Final    Comment: (NOTE) The GeneXpert MRSA Assay (FDA approved for NASAL specimens only), is one component of a comprehensive MRSA colonization surveillance program. It is not intended to diagnose MRSA infection nor to guide or monitor treatment for MRSA infections. Test performance is not FDA approved in patients less than 22 years old. Performed at [facility], [address], [place], [place] [address]   Gastrointestinal Panel by PCR , Stool     Status: None  Collection Time: 08/29/22  3:44 PM   Specimen: Stool  Result Value Ref Range Status   Campylobacter species NOT DETECTED NOT DETECTED Final   Plesimonas shigelloides NOT DETECTED NOT DETECTED Final   Salmonella species NOT DETECTED NOT DETECTED Final   Yersinia enterocolitica NOT DETECTED NOT DETECTED Final   Vibrio species NOT DETECTED NOT DETECTED Final   Vibrio cholerae NOT DETECTED NOT DETECTED Final   Enteroaggregative E coli (EAEC) NOT DETECTED NOT DETECTED Final   Enteropathogenic E coli (EPEC) NOT DETECTED NOT DETECTED Final   Enterotoxigenic E coli (ETEC) NOT DETECTED NOT DETECTED Final   Shiga like toxin producing E coli (STEC) NOT DETECTED NOT DETECTED Final   Shigella/Enteroinvasive E coli (EIEC) NOT DETECTED NOT DETECTED Final   Cryptosporidium NOT DETECTED NOT DETECTED Final   Cyclospora cayetanensis NOT DETECTED NOT DETECTED Final   Entamoeba histolytica NOT DETECTED NOT DETECTED Final   Giardia lamblia NOT DETECTED NOT DETECTED Final   Adenovirus F40/41 NOT DETECTED NOT DETECTED Final   Astrovirus NOT DETECTED NOT DETECTED Final   Norovirus GI/GII NOT DETECTED NOT DETECTED Final   Rotavirus A NOT DETECTED NOT DETECTED Final   Sapovirus (I, II, IV, and V) NOT DETECTED NOT DETECTED Final    Comment: Performed at [facility], [address] [place], [place] [address]      Radiology Studies last 3 days: No results found.           LOS: 7 days       Emeterio Reeve, DO [facility] 09/04/2022, 11:14 AM    Dictation software may have been used to generate the above note. Typos may occur and escape review in typed/dictated notes. Please contact Dr Sheppard Coil directly for clarity if needed.  Staff may message me via secure chat in [place]  but this may not receive an immediate response,  please page me for urgent matters!  If 7PM-7AM, please contact night coverage [email or web address]

## 2022-09-05 DIAGNOSIS — D62 Acute posthemorrhagic anemia: Secondary | ICD-10-CM | POA: Diagnosis not present

## 2022-09-05 LAB — RESPIRATORY PANEL BY PCR

## 2022-09-05 LAB — GLUCOSE, CAPILLARY
Glucose-Capillary: 121 mg/dL — ABNORMAL HIGH (ref 70–99)
Glucose-Capillary: 120 mg/dL — ABNORMAL HIGH (ref 70–99)
Glucose-Capillary: 150 mg/dL — ABNORMAL HIGH (ref 70–99)
Glucose-Capillary: 145 mg/dL — ABNORMAL HIGH (ref 70–99)

## 2022-09-05 LAB — RENAL FUNCTION PANEL
Albumin: 2.5 g/dL — ABNORMAL LOW (ref 3.5–5.0)
Anion gap: 12 (ref 5–15)
BUN: 72 mg/dL — ABNORMAL HIGH (ref 8–23)
CO2: 26 mmol/L (ref 22–32)
Calcium: 8.6 mg/dL — ABNORMAL LOW (ref 8.9–10.3)
Chloride: 96 mmol/L — ABNORMAL LOW (ref 98–111)
Creatinine, Ser: 9.24 mg/dL — ABNORMAL HIGH (ref 0.44–1.00)
GFR, Estimated: 4 mL/min — ABNORMAL LOW (ref >60)
Glucose, Bld: 129 mg/dL — ABNORMAL HIGH (ref 70–99)
Phosphorus: 6.2 mg/dL — ABNORMAL HIGH (ref 2.5–4.6)
Potassium: 5 mmol/L (ref 3.5–5.1)
Sodium: 134 mmol/L — ABNORMAL LOW (ref 135–145)

## 2022-09-05 LAB — CBC
HCT: 21.6 % — ABNORMAL LOW (ref 36.0–46.0)
Hemoglobin: 6.8 g/dL — ABNORMAL LOW (ref 12.0–15.0)
MCH: 26.6 pg (ref 26.0–34.0)
MCHC: 31.5 g/dL (ref 30.0–36.0)
MCV: 84.4 fL (ref 80.0–100.0)
Platelets: 206 10*3/uL (ref 150–400)
RBC: 2.56 MIL/uL — ABNORMAL LOW (ref 3.87–5.11)
RDW: 16.2 % — ABNORMAL HIGH (ref 11.5–15.5)
WBC: 9.1 10*3/uL (ref 4.0–10.5)
nRBC: 0 % (ref 0.0–0.2)

## 2022-09-05 LAB — RESP PANEL BY RT-PCR (RSV, FLU A&B, COVID)  RVPGX2
Influenza A by PCR: NEGATIVE
Influenza B by PCR: NEGATIVE
Resp Syncytial Virus by PCR: NEGATIVE
SARS Coronavirus 2 by RT PCR: NEGATIVE

## 2022-09-05 LAB — HEMOGLOBIN AND HEMATOCRIT, BLOOD
HCT: 25.5 % — ABNORMAL LOW (ref 36.0–46.0)
Hemoglobin: 8.2 g/dL — ABNORMAL LOW (ref 12.0–15.0)

## 2022-09-05 LAB — PREPARE RBC (CROSSMATCH)

## 2022-09-05 MED ORDER — SODIUM CHLORIDE 0.9% IV SOLUTION: Freq: Once | INTRAVENOUS | Status: AC

## 2022-09-05 MED ORDER — POLYETHYLENE GLYCOL 3350 17 G PO PACK
17.0000 g | PACK | Freq: Two times a day (BID) | ORAL | Status: AC
  Administered 2022-09-05 – 2022-09-06 (×3): 17 g via ORAL
  Filled 2022-09-05 (×3): qty 1

## 2022-09-05 MED ORDER — SENNOSIDES-DOCUSATE SODIUM 8.6-50 MG PO TABS
2.0000 | ORAL_TABLET | Freq: Two times a day (BID) | ORAL | Status: AC
  Administered 2022-09-05 – 2022-09-06 (×3): 2 via ORAL
  Filled 2022-09-05 (×3): qty 2

## 2022-09-05 MED ORDER — EPOETIN ALFA 10000 UNIT/ML IJ SOLN
INTRAMUSCULAR | Status: AC
  Filled 2022-09-05: qty 1

## 2022-09-05 MED ORDER — EPOETIN ALFA 10000 UNIT/ML IJ SOLN
10000.0000 [IU] | INTRAMUSCULAR | Status: DC
  Administered 2022-09-05 – 2022-09-09 (×3): 10000 [IU] via INTRAVENOUS
  Filled 2022-09-05 (×2): qty 1

## 2022-09-05 NOTE — Progress Notes (Signed)
  Received patient in bed to unit.   Informed consent signed and in chart.    TX duration:3.30     Transported by  Hand-off given to patient's nurse.    Access used: LAVF Access issues: none   Total UF removed: 783m Medication(s) given: epogen 10,000u Post HD VS: 120/52 hr 71 Post HD weight: 103.7kg     [facility]

## 2022-09-05 NOTE — Care Management Important Message (Signed)
Important Message  Patient Details  Name: Cynthia Dean MRN: [id number] Date of Birth: 02-09-1951   Medicare Important Message Given:  Yes  Patient out of room upon time of visit, no family available.  Copy of Medicare IM left on counter in room for reference.   [name] 09/05/2022, 11:38 AM

## 2022-09-05 NOTE — Progress Notes (Signed)
PROGRESS NOTE    Cynthia Dean   [id number] DOB: April 13, 1951  DOA: 08/26/2022 Date of Service: 09/05/22 PCP: Lowella Bandy, MD     Brief Narrative / Hospital Course:  Cynthia Dean is a 72 y.o. female with medical history significant for ESRD on HD MWF, insulin requiring T2DM, COPD on home 4 L home O2, CAD, carotid stenosis, seizure disorder, HTN, prior CVA, GERD, GI bleeding 2020 with unremarkable EGD and colonoscopy, left breast ca s/p partial mastectomy and left axillary dissection on 0000000 complicated by left breast abscess with coag negative staph bacteremia as well as small bilateral PE 03/20/2022, currently on Eliquis, who was sent from dialysis to the ED 08/26/2022 for evaluation of hemoglobin of 5.7, down from baseline of around 7.6-8.3.  02/02 (+)guaiac stool in ED.  Transfused 3 units PRBC.  EGD on 02/04 no concerns. Continued on po protonix.  colonoscopy 02/07 (had been delayed d/t weekend and to dialysis schedule) (+)BRB, diverticular disease, no active bleed noted. GI suspect she had diverticular bleed. 09/01/22 Hgb 7.0, receiving another unit PRBC at HD.  Repeat HD morning, 02/09, CBC shows improved Hgb. Spoke w/ daughter and patient, all questions answered, confirmed stopping Eliquis - pt was set to discharge but then unable to stand up to get dressed, d/c cancelled and PT/OT to see if able to qualify for HH/SNF 02/10 PT recs for SNF, TOC consulted. 02/11 (Sunday) pending placement   Consultants:  Nephrology Gastroenterology   Procedures: EGD 08/28/22 Colonoscopy 08/31/22      ASSESSMENT & PLAN:   Principal Problem:   Acute on chronic blood loss anemia Active Problems:   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-stage renal disease) ([place])   History of lower GI bleeding   Chronic anticoagulation   History of pulmonary embolism 03/20/2022   ESRD on hemodialysis (HCC)   Chronic diastolic CHF (congestive heart failure) (HCC)   CAD (coronary artery  disease)   Essential hypertension   COPD (chronic obstructive pulmonary disease) (HCC)   Chronic respiratory failure with hypoxia (HCC)   Depression   Seizure disorder (HCC)   History of CVA (cerebrovascular accident)   Insulin-requiring or dependent type II diabetes mellitus ([place])   Breast cancer, left S/P partial mastectomy,03/04/2022   Hemoglobin drop   Cough, sinus congestion Concern for PNA vs viral respiratory illness +/- COPD exacerbation  Onset today Resp PCR w/ Flu/COVID as well CXR pending  Albuterol prn  Acute on chronic blood loss anemia Anemia of chronic kidney disease Chronic anticoagulation with Eliquis - discontinued in shared decision making w/ her daughter  History of rectal bleeding 01/2019 with unremarkable EGD and colonoscopy baseline hemoglobin around 8.0 s/p total 4u pRBC this admission EGD, unremarkable Colonoscopy, diverticular disease, BRB, likely diverticular bleed  Discontinuing Eliquis (taking for PE since August 2023) Hgb down again today, will order 1 unit PRBC    Chronic anticoagulation History of PE 03/20/2022 S/p partial mastectomy for left breast cancer 03/04/2022 Discontinuing Eliquis in the setting of GI bleeding and adeuqate treatment for primary VTE   Chronic diastolic CHF (congestive heart failure) (HCC) Clinically euvolemic cont home torsemide   ESRD on hemodialysis [facility]) Nephrology following Dialysis as scheduled    CAD (coronary artery disease) Continue aspirin and atorvastatin   Essential hypertension Continue metoprolol and torsemide cont home losartan nightly   COPD (chronic obstructive pulmonary disease) ([place]) Chronic respiratory failure on 4L O2 Not currently exacerbated Continue home inhalers and baseline oxygen   Depression Continue sertraline and trazodone   Seizure  disorder ([place]) Continue Keppra   Breast cancer, left S/P partial mastectomy 03/04/2022 Not followed by oncology.  No oncology follow up scheduled  currently - her oncology team reached out for follow up but family declined. Patient's lumpectomy was complicated by abscess and staph bacteremia Patient says she does not think she wants to pursue any further treatment   Insulin-requiring or dependent type II diabetes mellitus ([place]) recent A1c 7.0 SSI achs   History of CVA (cerebrovascular accident) Continue aspirin and atorvastatin    DVT prophylaxis: SCD/compression hose  Pertinent IV fluids/nutrition: none Central lines / invasive devices: none  Code Status: FULL CODE  Current Admission Status: inpatient  Med/Surg  TOC needs / Dispo plan: SNF Barriers to discharge / significant pending items: placement, now respiratory illness working up as above              Subjective / Brief ROS:  Patient reports no concerns today  Denies CP/SOB.  Pain controlled. Tolerating diet.    Family Communication: none at this time     Objective Findings:  Vitals:   09/05/22 1200 09/05/22 1230 09/05/22 1237 09/05/22 1250  BP: (!) 121/54 (!) 137/57 (!) 131/56   Pulse: 79 72 72   Resp: 20 18 17   $ Temp:   98 F (36.7 C)   TempSrc:   Oral   SpO2: 95% 100% 100%   Weight:    103.7 kg  Height:        Intake/Output Summary (Last 24 hours) at 09/05/2022 1304 Last data filed at 09/05/2022 1237 Gross per 24 hour  Intake --  Output 700 ml  Net -700 ml   Filed Weights   09/02/22 1211 09/05/22 0831 09/05/22 1250  Weight: 104.3 kg 105.9 kg 103.7 kg    Examination:  Physical Exam Constitutional:      General: She is not in acute distress.    Appearance: Normal appearance. She is obese.  Cardiovascular:     Rate and Rhythm: Normal rate and regular rhythm.     Heart sounds: Murmur heard.  Pulmonary:     Effort: Pulmonary effort is normal. No respiratory distress.     Breath sounds: Wheezing present.  Abdominal:     Palpations: Abdomen is soft.  Neurological:     General: No focal deficit present.     Mental Status: She  is alert. Mental status is at baseline.  Psychiatric:        Mood and Affect: Mood normal.        Behavior: Behavior normal.          Scheduled Medications:   aspirin EC  81 mg Oral Daily   atorvastatin  80 mg Oral QPM   Chlorhexidine Gluconate Cloth  6 each Topical Q0600   cinacalcet  30 mg Oral Q breakfast   epoetin (EPOGEN/PROCRIT) injection  10,000 Units Intravenous Q M,W,F-HD   gabapentin  100 mg Oral BID   insulin aspart  0-6 Units Subcutaneous TID WC   Ipratropium-Albuterol  1 puff Inhalation Q6H   levETIRAcetam  1,000 mg Oral Daily   levETIRAcetam  250 mg Oral Once per day on Mon Wed Fri   losartan  100 mg Oral QHS   metoprolol succinate  100 mg Oral Daily   pantoprazole  40 mg Oral Daily   sertraline  25 mg Oral Daily   torsemide  100 mg Oral Daily   traZODone  50 mg Oral QHS    Continuous Infusions:  anticoagulant sodium citrate  PRN Medications:  acetaminophen **OR** acetaminophen, albuterol, alteplase, anticoagulant sodium citrate, heparin, HYDROcodone-acetaminophen, lidocaine (PF), lidocaine-prilocaine, pentafluoroprop-tetrafluoroeth, polyethylene glycol-electrolytes  Antimicrobials from admission:  Anti-infectives (From admission, onward)    None           Data Reviewed:  I have personally reviewed the following...  CBC: Recent Labs  Lab 08/31/22 0413 09/01/22 0353 09/02/22 0529 09/04/22 0436 09/05/22 0847  WBC 8.5 7.9 9.3 10.2 9.1  HGB 7.5* 7.0* 7.9* 7.5* 6.8*  HCT 24.0* 22.4* 24.6* 24.1* 21.6*  MCV 85.4 85.5 84.5 85.5 84.4  PLT 175 167 165 183 [id number]   Basic Metabolic Panel: Recent Labs  Lab 08/30/22 0456 08/31/22 0413 09/01/22 0353 09/02/22 0529 09/05/22 0847  NA 137 138 135 133* 134*  K 3.8 4.3 4.8 4.3 5.0  CL 98 102 99 98 96*  CO2 27 26 26 27 26  $ GLUCOSE 149* 104* 104* 151* 129*  BUN 35* 43* 53* 44* 72*  CREATININE 6.03* 7.53* 9.13* 7.43* 9.24*  CALCIUM 7.8* 8.3* 8.2* 8.0* 8.6*  MG 1.8 1.8 1.7 1.7  --   PHOS  --    --   --   --  6.2*   GFR: Estimated Creatinine Clearance: 7.2 mL/min (A) (by C-G formula based on SCr of 9.24 mg/dL (H)). Liver Function Tests: Recent Labs  Lab 09/05/22 0847  ALBUMIN 2.5*    No results for input(s): "LIPASE", "AMYLASE" in the last 168 hours. No results for input(s): "AMMONIA" in the last 168 hours. Coagulation Profile: No results for input(s): "INR", "PROTIME" in the last 168 hours. Cardiac Enzymes: No results for input(s): "CKTOTAL", "CKMB", "CKMBINDEX", "TROPONINI" in the last 168 hours. BNP (last 3 results) No results for input(s): "PROBNP" in the last 8760 hours. HbA1C: No results for input(s): "HGBA1C" in the last 72 hours. CBG: Recent Labs  Lab 09/04/22 0818 09/04/22 1228 09/04/22 1647 09/04/22 2011 09/05/22 0754  GLUCAP 150* 140* 156* 182* 121*   Lipid Profile: No results for input(s): "CHOL", "HDL", "LDLCALC", "TRIG", "CHOLHDL", "LDLDIRECT" in the last 72 hours. Thyroid Function Tests: No results for input(s): "TSH", "T4TOTAL", "FREET4", "T3FREE", "THYROIDAB" in the last 72 hours. Anemia Panel: No results for input(s): "VITAMINB12", "FOLATE", "FERRITIN", "TIBC", "IRON", "RETICCTPCT" in the last 72 hours. Most Recent Urinalysis On File:     Component Value Date/Time   COLORURINE YELLOW (A) 03/20/2022 0125   APPEARANCEUR CLEAR (A) 03/20/2022 0125   APPEARANCEUR Clear 06/14/2014 1510   LABSPEC 1.010 03/20/2022 0125   LABSPEC 1.006 06/14/2014 1510   PHURINE 7.0 03/20/2022 0125   GLUCOSEU 50 (A) 03/20/2022 0125   GLUCOSEU >=500 06/14/2014 1510   HGBUR MODERATE (A) 03/20/2022 0125   BILIRUBINUR NEGATIVE 03/20/2022 0125   BILIRUBINUR Negative 06/14/2014 1510   KETONESUR NEGATIVE 03/20/2022 0125   PROTEINUR 100 (A) 03/20/2022 0125   NITRITE NEGATIVE 03/20/2022 0125   LEUKOCYTESUR NEGATIVE 03/20/2022 0125   LEUKOCYTESUR Negative 06/14/2014 1510   Sepsis Labs: @LABRCNTIP$ (procalcitonin:4,lacticidven:4) Microbiology: Recent Results (from the  past 240 hour(s))  MRSA Next Gen by PCR, Nasal     Status: None   Collection Time: 08/28/22  4:20 PM   Specimen: Nasal Mucosa; Nasal Swab  Result Value Ref Range Status   MRSA by PCR Next Gen NOT DETECTED NOT DETECTED Final    Comment: (NOTE) The GeneXpert MRSA Assay (FDA approved for NASAL specimens only), is one component of a comprehensive MRSA colonization surveillance program. It is not intended to diagnose MRSA infection nor to guide or monitor treatment for MRSA infections.  Test performance is not FDA approved in patients less than 20 years old. Performed at [facility], [address] [place], [place] [address]   Gastrointestinal Panel by PCR , Stool     Status: None   Collection Time: 08/29/22  3:44 PM   Specimen: Stool  Result Value Ref Range Status   Campylobacter species NOT DETECTED NOT DETECTED Final   Plesimonas shigelloides NOT DETECTED NOT DETECTED Final   Salmonella species NOT DETECTED NOT DETECTED Final   Yersinia enterocolitica NOT DETECTED NOT DETECTED Final   Vibrio species NOT DETECTED NOT DETECTED Final   Vibrio cholerae NOT DETECTED NOT DETECTED Final   Enteroaggregative E coli (EAEC) NOT DETECTED NOT DETECTED Final   Enteropathogenic E coli (EPEC) NOT DETECTED NOT DETECTED Final   Enterotoxigenic E coli (ETEC) NOT DETECTED NOT DETECTED Final   Shiga like toxin producing E coli (STEC) NOT DETECTED NOT DETECTED Final   Shigella/Enteroinvasive E coli (EIEC) NOT DETECTED NOT DETECTED Final   Cryptosporidium NOT DETECTED NOT DETECTED Final   Cyclospora cayetanensis NOT DETECTED NOT DETECTED Final   Entamoeba histolytica NOT DETECTED NOT DETECTED Final   Giardia lamblia NOT DETECTED NOT DETECTED Final   Adenovirus F40/41 NOT DETECTED NOT DETECTED Final   Astrovirus NOT DETECTED NOT DETECTED Final   Norovirus GI/GII NOT DETECTED NOT DETECTED Final   Rotavirus A NOT DETECTED NOT DETECTED Final   Sapovirus (I, II, IV, and V) NOT DETECTED NOT  DETECTED Final    Comment: Performed at [facility], [address], [place], [place] [address]      Radiology Studies last 3 days: No results found.           LOS: 8 days      Emeterio Reeve, DO [facility] 09/05/2022, 1:04 PM    Dictation software may have been used to generate the above note. Typos may occur and escape review in typed/dictated notes. Please contact Dr Sheppard Coil directly for clarity if needed.  Staff may message me via secure chat in [place]  but this may not receive an immediate response,  please page me for urgent matters!  If 7PM-7AM, please contact night coverage [email or web address]

## 2022-09-05 NOTE — Progress Notes (Signed)
1054: Hemodialysis NP ordered to turn off UF for the rest of today's treatment.

## 2022-09-05 NOTE — Progress Notes (Signed)
[facility]  ROUNDING NOTE   Subjective:   Cynthia Dean s is a 72 y.o. 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 Patient presents to ED from dialysis with abnormal labs, hgb 5.7. She has been admitted under observation for Chronic blood loss anemia [D50.0] Gastrointestinal hemorrhage, unspecified gastrointestinal hemorrhage type [K92.2] Anemia, unspecified type [D64.9] Hemoglobin drop [R71.0]  Patient is known to our practice and receives outpatient dialysis treatments at [facility] on a MWF schedule, supervised by Dr. Holley Raring.    Patient seen and evaluated during dialysis   HEMODIALYSIS FLOWSHEET:  Blood Flow Rate (mL/min): 400 mL/min Arterial Pressure (mmHg): -240 mmHg Venous Pressure (mmHg): 200 mmHg TMP (mmHg): 14 mmHg Ultrafiltration Rate (mL/min): 829 mL/min Dialysate Flow Rate (mL/min): 300 ml/min Dialysis Fluid Bolus: Normal Saline Bolus Amount (mL): 300 mL  Appears well today, smiling Denis pain and discomfort   Objective:  Vital signs in last 24 hours:  Temp:  [97.9 F (36.6 C)-98.6 F (37 C)] 98 F (36.7 C) (02/12 0831) Pulse Rate:  [67-73] 72 (02/12 1030) Resp:  [16-21] 17 (02/12 1030) BP: (120-138)/(55-66) 122/58 (02/12 1030) SpO2:  [97 %-100 %] 100 % (02/12 1030) Weight:  [105.9 kg] 105.9 kg (02/12 0831)  Weight change:  Filed Weights   09/02/22 0816 09/02/22 1211 09/05/22 0831  Weight: 105 kg 104.3 kg 105.9 kg    Intake/Output: I/O last 3 completed shifts: In: 120 [P.O.:120] Out: -    Intake/Output this shift:  No intake/output data recorded.  Physical Exam: General: NAD  Head: Normocephalic, atraumatic. Moist oral mucosal membranes  Eyes: Anicteric  Lungs:  Clear to auscultation, normal effort  Heart: Regular rate and rhythm  Abdomen:  Soft, nontender  Extremities:  No peripheral  edema.  Neurologic: Alert and oriented, moving all four extremities  Skin: No lesions  Access: Lt AVG    Basic Metabolic Panel: Recent Labs  Lab 08/30/22 0456 08/31/22 0413 09/01/22 0353 09/02/22 0529 09/05/22 0847  NA 137 138 135 133* 134*  K 3.8 4.3 4.8 4.3 5.0  CL 98 102 99 98 96*  CO2 27 26 26 27 26  $ GLUCOSE 149* 104* 104* 151* 129*  BUN 35* 43* 53* 44* 72*  CREATININE 6.03* 7.53* 9.13* 7.43* 9.24*  CALCIUM 7.8* 8.3* 8.2* 8.0* 8.6*  MG 1.8 1.8 1.7 1.7  --   PHOS  --   --   --   --  6.2*     Liver Function Tests: Recent Labs  Lab 09/05/22 0847  ALBUMIN 2.5*    No results for input(s): "LIPASE", "AMYLASE" in the last 168 hours. No results for input(s): "AMMONIA" in the last 168 hours.  CBC: Recent Labs  Lab 08/31/22 0413 09/01/22 0353 09/02/22 0529 09/04/22 0436 09/05/22 0847  WBC 8.5 7.9 9.3 10.2 9.1  HGB 7.5* 7.0* 7.9* 7.5* 6.8*  HCT 24.0* 22.4* 24.6* 24.1* 21.6*  MCV 85.4 85.5 84.5 85.5 84.4  PLT 175 167 165 183 206     Cardiac Enzymes: No results for input(s): "CKTOTAL", "CKMB", "CKMBINDEX", "TROPONINI" in the last 168 hours.  BNP: Invalid input(s): "POCBNP"  CBG: Recent Labs  Lab 09/04/22 0818 09/04/22 1228 09/04/22 1647 09/04/22 2011 09/05/22 0754  GLUCAP 150* 140* 156* 182* 30*     Microbiology: Results for orders placed or performed during the hospital encounter of 08/26/22  MRSA Next Gen by PCR, Nasal     Status:  None   Collection Time: 08/28/22  4:20 PM   Specimen: Nasal Mucosa; Nasal Swab  Result Value Ref Range Status   MRSA by PCR Next Gen NOT DETECTED NOT DETECTED Final    Comment: (NOTE) The GeneXpert MRSA Assay (FDA approved for NASAL specimens only), is one component of a comprehensive MRSA colonization surveillance program. It is not intended to diagnose MRSA infection nor to guide or monitor treatment for MRSA infections. Test performance is not FDA approved in patients less than 64 years old. Performed at  [facility], [address] [place], [place] [address]   Gastrointestinal Panel by PCR , Stool     Status: None   Collection Time: 08/29/22  3:44 PM   Specimen: Stool  Result Value Ref Range Status   Campylobacter species NOT DETECTED NOT DETECTED Final   Plesimonas shigelloides NOT DETECTED NOT DETECTED Final   Salmonella species NOT DETECTED NOT DETECTED Final   Yersinia enterocolitica NOT DETECTED NOT DETECTED Final   Vibrio species NOT DETECTED NOT DETECTED Final   Vibrio cholerae NOT DETECTED NOT DETECTED Final   Enteroaggregative E coli (EAEC) NOT DETECTED NOT DETECTED Final   Enteropathogenic E coli (EPEC) NOT DETECTED NOT DETECTED Final   Enterotoxigenic E coli (ETEC) NOT DETECTED NOT DETECTED Final   Shiga like toxin producing E coli (STEC) NOT DETECTED NOT DETECTED Final   Shigella/Enteroinvasive E coli (EIEC) NOT DETECTED NOT DETECTED Final   Cryptosporidium NOT DETECTED NOT DETECTED Final   Cyclospora cayetanensis NOT DETECTED NOT DETECTED Final   Entamoeba histolytica NOT DETECTED NOT DETECTED Final   Giardia lamblia NOT DETECTED NOT DETECTED Final   Adenovirus F40/41 NOT DETECTED NOT DETECTED Final   Astrovirus NOT DETECTED NOT DETECTED Final   Norovirus GI/GII NOT DETECTED NOT DETECTED Final   Rotavirus A NOT DETECTED NOT DETECTED Final   Sapovirus (I, II, IV, and V) NOT DETECTED NOT DETECTED Final    Comment: Performed at [facility], [address] [place], [place] [address]    Coagulation Studies: No results for input(s): "LABPROT", "INR" in the last 72 hours.  Urinalysis: No results for input(s): "COLORURINE", "LABSPEC", "PHURINE", "GLUCOSEU", "HGBUR", "BILIRUBINUR", "KETONESUR", "PROTEINUR", "UROBILINOGEN", "NITRITE", "LEUKOCYTESUR" in the last 72 hours.  Invalid input(s): "APPERANCEUR"    Imaging: No results found.   Medications:    anticoagulant sodium citrate      aspirin EC  81 mg Oral Daily   atorvastatin  80 mg Oral QPM    Chlorhexidine Gluconate Cloth  6 each Topical Q0600   cinacalcet  30 mg Oral Q breakfast   epoetin (EPOGEN/PROCRIT) injection  10,000 Units Intravenous Q M,W,F-HD   gabapentin  100 mg Oral BID   insulin aspart  0-6 Units Subcutaneous TID WC   Ipratropium-Albuterol  1 puff Inhalation Q6H   levETIRAcetam  1,000 mg Oral Daily   levETIRAcetam  250 mg Oral Once per day on Mon Wed Fri   losartan  100 mg Oral QHS   metoprolol succinate  100 mg Oral Daily   pantoprazole  40 mg Oral Daily   sertraline  25 mg Oral Daily   torsemide  100 mg Oral Daily   traZODone  50 mg Oral QHS   acetaminophen **OR** acetaminophen, albuterol, alteplase, anticoagulant sodium citrate, heparin, HYDROcodone-acetaminophen, lidocaine (PF), lidocaine-prilocaine, pentafluoroprop-tetrafluoroeth, polyethylene glycol-electrolytes  Assessment/ Plan:  Cynthia Dean is a 71 y.o.  female with past medical conditions including CAD, COPD with intermittent home O2, hypertension, hyperlipidemia, stroke, diabetes and end  stage renal disease on hemodialysis. Patient presents to ED from dialysis with abnormal labs, hgb 5.7. She has been admitted under observation for Chronic blood loss anemia [D50.0] Gastrointestinal hemorrhage, unspecified gastrointestinal hemorrhage type [K92.2] Anemia, unspecified type [D64.9] Hemoglobin drop [R71.0]  [facility]/MWF/Lt AVG   1. End stage renal disease on hemodialysis. Receiving dialysis today, UF 2L reduced to 0 due to decreased Hgb. Tolerating treatment well. Next treatment scheduled for Wednesday.   2. Anemia of chronic kidney disease with acute blood loss Lab Results  Component Value Date   HGB 6.8 (L) 09/05/2022   Hgb on admission 5.7 with positive guaiac. Patient has received 3 unit blood transfusion during this admission. GI consulted and EGD on 08/28/22, no signs of active bleeding. Colonoscopy on 08/31/22 negative for active bleeding, blood clots and pooling noted  in colon, believed to be diverticular in nature.   Hgb reduced today to 6.8. Primary team notified and will defer need for blood transfusion to them. Will increase to EPO 10000 units with dialysis.   3. Hypertension with chronic kidney disease. Home regimen includes losartan, metoprolol, and torsemide. Currently receiving these medications.  Blood pressure stable during dialysis, 120/58  4. Secondary Hyperparathyroidism: with outpatient labs: PTH 613, phosphorus 4.8, calcium 9.0 on 03/07/22.    :  Lab Results  Component Value Date   PTH 131 (H) 03/14/2018   CALCIUM 8.6 (L) 09/05/2022   CAION 1.09 (L) 08/28/2022   PHOS 6.2 (H) 09/05/2022    Calcium acceptable, phosphorus elevated. Will monitor for now.  Continue daily cinacalcet.    LOS: 8   2/12/202410:51 AM

## 2022-09-05 NOTE — Plan of Care (Signed)

## 2022-09-06 ENCOUNTER — Inpatient Hospital Stay: Payer: 59

## 2022-09-06 DIAGNOSIS — D62 Acute posthemorrhagic anemia: Secondary | ICD-10-CM | POA: Diagnosis not present

## 2022-09-06 LAB — GLUCOSE, CAPILLARY
Glucose-Capillary: 128 mg/dL — ABNORMAL HIGH (ref 70–99)
Glucose-Capillary: 176 mg/dL — ABNORMAL HIGH (ref 70–99)
Glucose-Capillary: 132 mg/dL — ABNORMAL HIGH (ref 70–99)
Glucose-Capillary: 128 mg/dL — ABNORMAL HIGH (ref 70–99)

## 2022-09-06 LAB — TYPE AND SCREEN
ABO/RH(D): A POS
Antibody Screen: NEGATIVE
Unit division: 0

## 2022-09-06 LAB — BPAM RBC
Blood Product Expiration Date: 202403092359
ISSUE DATE / TIME: 202402121649
Unit Type and Rh: 6200

## 2022-09-06 MED ORDER — UMECLIDINIUM-VILANTEROL 62.5-25 MCG/ACT IN AEPB: 1.0000 | INHALATION_SPRAY | Freq: Every day | RESPIRATORY_TRACT | 0 refills | Status: DC

## 2022-09-06 MED ORDER — EPOETIN ALFA 10000 UNIT/ML IJ SOLN: 10000.0000 [IU] | INTRAMUSCULAR | Status: DC

## 2022-09-06 MED ORDER — SENNOSIDES-DOCUSATE SODIUM 8.6-50 MG PO TABS: 2.0000 | ORAL_TABLET | Freq: Every evening | ORAL | Status: DC | PRN

## 2022-09-06 MED ORDER — POLYETHYLENE GLYCOL 3350 17 G PO PACK: 17.0000 g | PACK | Freq: Every day | ORAL | 0 refills | Status: DC

## 2022-09-06 NOTE — Progress Notes (Signed)
Brief note -  PT noted some concern for R sided weakness Went to examine pt - she states she is generally weak but denies feeling any focal weakness. On exam, her coordination is limited but is symmetrical, strength in bilateral upper extremities 4/5 (she can push me away and pull me toward her but is weak), lower extremities also weak 4/5 on leg raise, symmetrical.  Continue to monitor. Given CVA Hx will initiate neuro checks q4h x2

## 2022-09-06 NOTE — Progress Notes (Signed)
Occupational Therapy Treatment Patient Details Name: Cynthia Dean MRN: [id number] DOB: 09/19/1950 Today's Date: 09/06/2022   History of present illness Pt is a 72 y.o. female with medical history significant for ESRD on HD MWF, insulin requiring T2DM, COPD on 4LO2, CAD, carotid stenosis, seizure disorder, HTN, prior CVA, GERD, GI bleeding, left breast CA s/p partial mastectomy and left axillary dissection, small bilateral PE currently on Eliquis, who was sent from dialysis to the ED 08/26/2022 for evaluation of hemoglobin of 5.7.  MD assessment includes: Acute on chronic blood loss anemia and anemia of chronic disease.   OT comments  Ms Bott was seen for OT treatment on this date. Upon arrival to room pt reclined in bed, agreeable to tx. Pt requires TOTAL A don B socks at bed level. TOTAL A x2 toileting/pericare bed level. TOTAL A x2 sup<>sit and sit<>stand. Pt making good progress toward goals, will continue to follow POC. Discharge recommendation remains appropriate.     Recommendations for follow up therapy are one component of a multi-disciplinary discharge planning process, led by the attending physician.  Recommendations may be updated based on patient status, additional functional criteria and insurance authorization.    Follow Up Recommendations  Skilled nursing-short term rehab (<3 hours/day)     Assistance Recommended at Discharge Frequent or constant Supervision/Assistance  Patient can return home with the following  Two people to help with walking and/or transfers;A lot of help with bathing/dressing/bathroom;Assistance with cooking/housework;Direct supervision/assist for medications management;Assist for transportation;A lot of help with walking and/or transfers;Two people to help with bathing/dressing/bathroom;Assistance with feeding   Equipment Recommendations  None recommended by OT    Recommendations for Other Services      Precautions / Restrictions  Precautions Precautions: Fall Restrictions Weight Bearing Restrictions: No Other Position/Activity Restrictions: h/o seizures       Mobility Bed Mobility Overal bed mobility: Needs Assistance Bed Mobility: Supine to Sit, Sit to Supine     Supine to sit: Total assist, +2 for physical assistance Sit to supine: Total assist, +2 for physical assistance   General bed mobility comments: Total assist for BLE and trunk control    Transfers Overall transfer level: Needs assistance   Transfers: Sit to/from Stand Sit to Stand: +2 physical assistance, Max assist           General transfer comment: Pt able to come to partial stand with +2 max A with pt pulling up from sink counter; max standing tolerance around 30 sec but only with extensive +2 assist     Balance Overall balance assessment: Needs assistance Sitting-balance support: Bilateral upper extremity supported Sitting balance-Leahy Scale: Poor Sitting balance - Comments: L lateral lean that improved with weight shifting to the R followed by anterior lean; frequent min to mod A to achieve neutral sitting position Postural control: Left lateral lean Standing balance support: Bilateral upper extremity supported Standing balance-Leahy Scale: Poor                             ADL either performed or assessed with clinical judgement   ADL Overall ADL's : Needs assistance/impaired                                       General ADL Comments: TOTAL A don B socks at bed level. TOTAL A x2 toileting/pericare bed level  Cognition Arousal/Alertness: Lethargic Behavior During Therapy: Flat affect Overall Cognitive Status: Impaired/Different from baseline                                 General Comments: increased time for responses        Exercises Other Exercises Other Exercises: R lateral and posterior weight shifting activities in sitting to address poor static sitting  balance            Pertinent Vitals/ Pain       Pain Assessment Pain Assessment: No/denies pain   Frequency  Min 2X/week        Progress Toward Goals  OT Goals(current goals can now be found in the care plan section)  Progress towards OT goals: Progressing toward goals  Acute Rehab OT Goals Patient Stated Goal: go home OT Goal Formulation: With patient/family Time For Goal Achievement: 09/17/22 Potential to Achieve Goals: Fair ADL Goals Pt Will Perform Eating: with modified independence Pt Will Perform Upper Body Bathing: with mod assist Pt Will Perform Upper Body Dressing: with min assist Pt Will Perform Lower Body Dressing: with mod assist Pt Will Transfer to Toilet: with mod assist  Plan Discharge plan remains appropriate;Frequency remains appropriate    Co-evaluation    PT/OT/SLP Co-Evaluation/Treatment: Yes Reason for Co-Treatment: For patient/therapist safety;To address functional/ADL transfers PT goals addressed during session: Mobility/safety with mobility OT goals addressed during session: ADL's and self-care      AM-PAC OT "6 Clicks" Daily Activity     Outcome Measure   Help from another person eating meals?: A Lot Help from another person taking care of personal grooming?: A Lot Help from another person toileting, which includes using toliet, bedpan, or urinal?: Total Help from another person bathing (including washing, rinsing, drying)?: Total Help from another person to put on and taking off regular upper body clothing?: Total Help from another person to put on and taking off regular lower body clothing?: Total 6 Click Score: 8    End of Session    OT Visit Diagnosis: Muscle weakness (generalized) (M62.81);Cognitive communication deficit (R41.841);History of falling (Z91.81)   Activity Tolerance Patient limited by lethargy   Patient Left in bed;with call bell/phone within reach;with bed alarm set;with family/visitor present   Nurse  Communication          Time: [id number] OT Time Calculation (min): 31 min  Charges: OT General Charges $OT Visit: 1 Visit OT Treatments $Self Care/Home Management : 8-22 mins  Dessie Coma, M.S. OTR/L  09/06/22, 4:14 PM  ascom [phone]

## 2022-09-06 NOTE — Progress Notes (Addendum)
PROGRESS NOTE    Cynthia Dean   [id number] DOB: [birth year]  DOA: 08/26/2022 Date of Service: 09/06/22 PCP: Lowella Bandy, MD     Brief Narrative / Hospital Course:  Cynthia Dean is a 72 y.o. female with medical history significant for ESRD on HD MWF, insulin requiring T2DM, COPD on home 4 L home O2, CAD, carotid stenosis, seizure disorder, HTN, prior CVA, GERD, GI bleeding 2020 with unremarkable EGD and colonoscopy, left breast ca s/p partial mastectomy and left axillary dissection on 0000000 complicated by left breast abscess with coag negative staph bacteremia as well as small bilateral PE 03/20/2022, currently on Eliquis, who was sent from dialysis to the ED 08/26/2022 for evaluation of hemoglobin of 5.7, down from baseline of around 7.6-8.3.  02/02 (+)guaiac stool in ED.  Transfused 3 units PRBC.  EGD on 02/04 no concerns. Continued on po protonix.  colonoscopy 02/07 (had been delayed d/t weekend and to dialysis schedule) (+)BRB, diverticular disease, no active bleed noted. GI suspect she had diverticular bleed. 09/01/22 Hgb 7.0, receiving another unit PRBC at HD.  Repeat HD morning, 02/09, CBC shows improved Hgb. Spoke w/ daughter and patient, all questions answered, confirmed stopping Eliquis - pt was set to discharge but then unable to stand up to get dressed, d/c cancelled and PT/OT to see if able to qualify for HH/SNF 02/10 PT recs for SNF, TOC consulted. 02/11 (Sunday) pending placement. 02/13: [facility] pending   Consultants:  Nephrology Gastroenterology   Procedures: EGD 08/28/22 Colonoscopy 08/31/22      ASSESSMENT & PLAN:   Principal Problem:   Acute on chronic blood loss anemia Active Problems:   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-stage renal disease) (HCC)   History of lower GI bleeding   Chronic anticoagulation   History of pulmonary embolism 03/20/2022   ESRD on hemodialysis (HCC)   Chronic diastolic CHF (congestive heart failure) (HCC)   CAD  (coronary artery disease)   Essential hypertension   COPD (chronic obstructive pulmonary disease) (HCC)   Chronic respiratory failure with hypoxia (HCC)   Depression   Seizure disorder (HCC)   History of CVA (cerebrovascular accident)   Insulin-requiring or dependent type II diabetes mellitus ([place])   Breast cancer, left S/P partial mastectomy,03/04/2022   Hemoglobin drop   Cough, sinus congestion - improved CXR on personal review appears more pulmonary vascular congestion but ddx would include PNA vs viral respiratory illness +/- COPD - less likely given improvement in symptoms this morning, neg Flu/COVID/viral panel  Albuterol prn  Cognitive impairment / dementia Limits decision making capacity Stable  Acute on chronic blood loss anemia Anemia of chronic kidney disease Chronic anticoagulation with Eliquis - discontinued in shared decision making w/ her daughter  History of rectal bleeding 01/2019 with unremarkable EGD and colonoscopy baseline hemoglobin around 8.0 s/p total 4u pRBC this admission EGD, unremarkable Colonoscopy, diverticular disease, BRB, likely diverticular bleed  Discontinuing Eliquis (taking for PE since August 2023) Hgb will need frequent monitoring outpatient    Chronic anticoagulation History of PE 03/20/2022 S/p partial mastectomy for left breast cancer 03/04/2022 Discontinuing Eliquis in the setting of GI bleeding and adeuqate treatment for primary VTE   Chronic diastolic CHF (congestive heart failure) ([place]) Clinically euvolemic cont home torsemide   ESRD on hemodialysis [facility]) Nephrology following Dialysis as scheduled    CAD (coronary artery disease) Continue aspirin and atorvastatin   Essential hypertension Continue metoprolol and torsemide cont home losartan nightly   COPD (chronic obstructive pulmonary disease) ([place]) Chronic respiratory  failure on 4L O2 Not currently exacerbated Continue home inhalers and baseline oxygen    Depression Continue sertraline and trazodone   Seizure disorder (HCC) Continue Keppra   Breast cancer, left S/P partial mastectomy 03/04/2022 Her oncology team reached out for follow up but family declined. Patient's lumpectomy was complicated by abscess and staph bacteremia Patient says she does not think she wants to pursue any further treatment   Insulin-requiring or dependent type II diabetes mellitus ([place]) recent A1c 7.0 SSI achs   History of CVA (cerebrovascular accident) Continue aspirin and atorvastatin    DVT prophylaxis: SCD/compression hose  Pertinent IV fluids/nutrition: none Central lines / invasive devices: L chest port    Code Status: FULL CODE  Current Admission Status: inpatient  Med/Surg  TOC needs / Dispo plan: SNF Barriers to discharge / significant pending items: placement SNF auth pending              Subjective / Brief ROS:  Patient reports no concerns today - alert and pleasant  Denies CP/SOB. Cough / sinus pressure have resolved  Pain controlled. Tolerating diet.    Family Communication: spoke w/ daughter on the phone 09/06/22 3:00 PM     Objective Findings:  Vitals:   09/05/22 1707 09/05/22 2103 09/06/22 0438 09/06/22 0752  BP: (!) 131/41 (!) 150/64 (!) 129/57 124/66  Pulse: 80 81 75 72  Resp: 18 16 18 20  $ Temp: 98 F (36.7 C) 100 F (37.8 C) 99.1 F (37.3 C) 98 F (36.7 C)  TempSrc: Oral Oral Oral Oral  SpO2:  97% 91% 99%  Weight:      Height:        Intake/Output Summary (Last 24 hours) at 09/06/2022 1148 Last data filed at 09/06/2022 [id number] Gross per 24 hour  Intake 442.5 ml  Output 1100 ml  Net -657.5 ml   Filed Weights   09/02/22 1211 09/05/22 0831 09/05/22 1250  Weight: 104.3 kg 105.9 kg 103.7 kg    Examination:  Physical Exam Constitutional:      General: She is not in acute distress.    Appearance: Normal appearance. She is obese.  Cardiovascular:     Rate and Rhythm: Normal rate and regular rhythm.      Heart sounds: Murmur heard.  Pulmonary:     Effort: Pulmonary effort is normal. No respiratory distress.     Breath sounds: No wheezing or rales.  Abdominal:     Palpations: Abdomen is soft.  Neurological:     General: No focal deficit present.     Mental Status: She is alert. Mental status is at baseline.  Psychiatric:        Mood and Affect: Mood normal.        Behavior: Behavior normal.          Scheduled Medications:   aspirin EC  81 mg Oral Daily   atorvastatin  80 mg Oral QPM   Chlorhexidine Gluconate Cloth  6 each Topical Q0600   cinacalcet  30 mg Oral Q breakfast   epoetin (EPOGEN/PROCRIT) injection  10,000 Units Intravenous Q M,W,F-HD   gabapentin  100 mg Oral BID   insulin aspart  0-6 Units Subcutaneous TID WC   Ipratropium-Albuterol  1 puff Inhalation Q6H   levETIRAcetam  1,000 mg Oral Daily   levETIRAcetam  250 mg Oral Once per day on Mon Wed Fri   losartan  100 mg Oral QHS   metoprolol succinate  100 mg Oral Daily   pantoprazole  40  mg Oral Daily   polyethylene glycol  17 g Oral BID   senna-docusate  2 tablet Oral BID   sertraline  25 mg Oral Daily   torsemide  100 mg Oral Daily   traZODone  50 mg Oral QHS    Continuous Infusions:  anticoagulant sodium citrate      PRN Medications:  acetaminophen **OR** acetaminophen, albuterol, alteplase, anticoagulant sodium citrate, heparin, HYDROcodone-acetaminophen, lidocaine (PF), lidocaine-prilocaine, pentafluoroprop-tetrafluoroeth  Antimicrobials from admission:  Anti-infectives (From admission, onward)    None           Data Reviewed:  I have personally reviewed the following...  CBC: Recent Labs  Lab 08/31/22 0413 09/01/22 0353 09/02/22 0529 09/04/22 0436 09/05/22 0847 09/05/22 2214  WBC 8.5 7.9 9.3 10.2 9.1  --   HGB 7.5* 7.0* 7.9* 7.5* 6.8* 8.2*  HCT 24.0* 22.4* 24.6* 24.1* 21.6* 25.5*  MCV 85.4 85.5 84.5 85.5 84.4  --   PLT 175 167 165 183 206  --    Basic Metabolic  Panel: Recent Labs  Lab 08/31/22 0413 09/01/22 0353 09/02/22 0529 09/05/22 0847  NA 138 135 133* 134*  K 4.3 4.8 4.3 5.0  CL 102 99 98 96*  CO2 26 26 27 26  $ GLUCOSE 104* 104* 151* 129*  BUN 43* 53* 44* 72*  CREATININE 7.53* 9.13* 7.43* 9.24*  CALCIUM 8.3* 8.2* 8.0* 8.6*  MG 1.8 1.7 1.7  --   PHOS  --   --   --  6.2*   GFR: Estimated Creatinine Clearance: 7.2 mL/min (A) (by C-G formula based on SCr of 9.24 mg/dL (H)). Liver Function Tests: Recent Labs  Lab 09/05/22 0847  ALBUMIN 2.5*    No results for input(s): "LIPASE", "AMYLASE" in the last 168 hours. No results for input(s): "AMMONIA" in the last 168 hours. Coagulation Profile: No results for input(s): "INR", "PROTIME" in the last 168 hours. Cardiac Enzymes: No results for input(s): "CKTOTAL", "CKMB", "CKMBINDEX", "TROPONINI" in the last 168 hours. BNP (last 3 results) No results for input(s): "PROBNP" in the last 8760 hours. HbA1C: No results for input(s): "HGBA1C" in the last 72 hours. CBG: Recent Labs  Lab 09/05/22 1343 09/05/22 1711 09/05/22 2105 09/06/22 0727 09/06/22 1144  GLUCAP 120* 150* 145* 128* 176*   Lipid Profile: No results for input(s): "CHOL", "HDL", "LDLCALC", "TRIG", "CHOLHDL", "LDLDIRECT" in the last 72 hours. Thyroid Function Tests: No results for input(s): "TSH", "T4TOTAL", "FREET4", "T3FREE", "THYROIDAB" in the last 72 hours. Anemia Panel: No results for input(s): "VITAMINB12", "FOLATE", "FERRITIN", "TIBC", "IRON", "RETICCTPCT" in the last 72 hours. Most Recent Urinalysis On File:     Component Value Date/Time   COLORURINE YELLOW (A) 03/20/2022 0125   APPEARANCEUR CLEAR (A) 03/20/2022 0125   APPEARANCEUR Clear 06/14/2014 1510   LABSPEC 1.010 03/20/2022 0125   LABSPEC 1.006 06/14/2014 1510   PHURINE 7.0 03/20/2022 0125   GLUCOSEU 50 (A) 03/20/2022 0125   GLUCOSEU >=500 06/14/2014 1510   HGBUR MODERATE (A) 03/20/2022 0125   BILIRUBINUR NEGATIVE 03/20/2022 0125   BILIRUBINUR  Negative 06/14/2014 1510   KETONESUR NEGATIVE 03/20/2022 0125   PROTEINUR 100 (A) 03/20/2022 0125   NITRITE NEGATIVE 03/20/2022 0125   LEUKOCYTESUR NEGATIVE 03/20/2022 0125   LEUKOCYTESUR Negative 06/14/2014 1510   Sepsis Labs: @LABRCNTIP$ (procalcitonin:4,lacticidven:4) Microbiology: Recent Results (from the past 240 hour(s))  MRSA Next Gen by PCR, Nasal     Status: None   Collection Time: 08/28/22  4:20 PM   Specimen: Nasal Mucosa; Nasal Swab  Result Value Ref Range  Status   MRSA by PCR Next Gen NOT DETECTED NOT DETECTED Final    Comment: (NOTE) The GeneXpert MRSA Assay (FDA approved for NASAL specimens only), is one component of a comprehensive MRSA colonization surveillance program. It is not intended to diagnose MRSA infection nor to guide or monitor treatment for MRSA infections. Test performance is not FDA approved in patients less than 69 years old. Performed at [facility], [address] [place], [place] [address]   Gastrointestinal Panel by PCR , Stool     Status: None   Collection Time: 08/29/22  3:44 PM   Specimen: Stool  Result Value Ref Range Status   Campylobacter species NOT DETECTED NOT DETECTED Final   Plesimonas shigelloides NOT DETECTED NOT DETECTED Final   Salmonella species NOT DETECTED NOT DETECTED Final   Yersinia enterocolitica NOT DETECTED NOT DETECTED Final   Vibrio species NOT DETECTED NOT DETECTED Final   Vibrio cholerae NOT DETECTED NOT DETECTED Final   Enteroaggregative E coli (EAEC) NOT DETECTED NOT DETECTED Final   Enteropathogenic E coli (EPEC) NOT DETECTED NOT DETECTED Final   Enterotoxigenic E coli (ETEC) NOT DETECTED NOT DETECTED Final   Shiga like toxin producing E coli (STEC) NOT DETECTED NOT DETECTED Final   Shigella/Enteroinvasive E coli (EIEC) NOT DETECTED NOT DETECTED Final   Cryptosporidium NOT DETECTED NOT DETECTED Final   Cyclospora cayetanensis NOT DETECTED NOT DETECTED Final   Entamoeba histolytica NOT DETECTED NOT  DETECTED Final   Giardia lamblia NOT DETECTED NOT DETECTED Final   Adenovirus F40/41 NOT DETECTED NOT DETECTED Final   Astrovirus NOT DETECTED NOT DETECTED Final   Norovirus GI/GII NOT DETECTED NOT DETECTED Final   Rotavirus A NOT DETECTED NOT DETECTED Final   Sapovirus (I, II, IV, and V) NOT DETECTED NOT DETECTED Final    Comment: Performed at [facility], [address] [place], [place] [address]  Resp panel by RT-PCR (RSV, Flu A&B, Covid) Anterior Nasal Swab     Status: None   Collection Time: 09/05/22  1:52 PM   Specimen: Anterior Nasal Swab  Result Value Ref Range Status   SARS Coronavirus 2 by RT PCR NEGATIVE NEGATIVE Final    Comment: (NOTE) SARS-CoV-2 target nucleic acids are NOT DETECTED.  The SARS-CoV-2 RNA is generally detectable in upper respiratory specimens during the acute phase of infection. The lowest concentration of SARS-CoV-2 viral copies this assay can detect is 138 copies/mL. A negative result does not preclude SARS-Cov-2 infection and should not be used as the sole basis for treatment or other patient management decisions. A negative result may occur with  improper specimen collection/handling, submission of specimen other than nasopharyngeal swab, presence of viral mutation(s) within the areas targeted by this assay, and inadequate number of viral copies(<138 copies/mL). A negative result must be combined with clinical observations, patient history, and epidemiological information. The expected result is Negative.  Fact Sheet for Patients:  EntrepreneurPulse.com.au  Fact Sheet for Healthcare Providers:  IncredibleEmployment.be  This test is no t yet approved or cleared by the [place] FDA and  has been authorized for detection and/or diagnosis of SARS-CoV-2 by FDA under an Emergency Use Authorization (EUA). This EUA will remain  in effect (meaning this test can be used) for the duration of the COVID-19  declaration under Section 564(b)(1) of the Act, 21 U.S.C.section 360bbb-3(b)(1), unless the authorization is terminated  or revoked sooner.       Influenza A by PCR NEGATIVE NEGATIVE Final   Influenza B by  PCR NEGATIVE NEGATIVE Final    Comment: (NOTE) The Xpert Xpress SARS-CoV-2/FLU/RSV plus assay is intended as an aid in the diagnosis of influenza from Nasopharyngeal swab specimens and should not be used as a sole basis for treatment. Nasal washings and aspirates are unacceptable for Xpert Xpress SARS-CoV-2/FLU/RSV testing.  Fact Sheet for Patients: EntrepreneurPulse.com.au  Fact Sheet for Healthcare Providers: IncredibleEmployment.be  This test is not yet approved or cleared by the [place] FDA and has been authorized for detection and/or diagnosis of SARS-CoV-2 by FDA under an Emergency Use Authorization (EUA). This EUA will remain in effect (meaning this test can be used) for the duration of the COVID-19 declaration under Section 564(b)(1) of the Act, 21 U.S.C. section 360bbb-3(b)(1), unless the authorization is terminated or revoked.     Resp Syncytial Virus by PCR NEGATIVE NEGATIVE Final    Comment: (NOTE) Fact Sheet for Patients: EntrepreneurPulse.com.au  Fact Sheet for Healthcare Providers: IncredibleEmployment.be  This test is not yet approved or cleared by the [place] FDA and has been authorized for detection and/or diagnosis of SARS-CoV-2 by FDA under an Emergency Use Authorization (EUA). This EUA will remain in effect (meaning this test can be used) for the duration of the COVID-19 declaration under Section 564(b)(1) of the Act, 21 U.S.C. section 360bbb-3(b)(1), unless the authorization is terminated or revoked.  Performed at [facility], [place], [place] [address]   Respiratory (~20 pathogens) panel by PCR     Status: None   Collection Time:  09/05/22  2:50 PM  Result Value Ref Range Status   Adenovirus NOT DETECTED NOT DETECTED Final   Coronavirus 229E NOT DETECTED NOT DETECTED Final    Comment: (NOTE) The Coronavirus on the Respiratory Panel, DOES NOT test for the novel  Coronavirus (2019 nCoV)    Coronavirus HKU1 NOT DETECTED NOT DETECTED Final   Coronavirus NL63 NOT DETECTED NOT DETECTED Final   Coronavirus OC43 NOT DETECTED NOT DETECTED Final   Metapneumovirus NOT DETECTED NOT DETECTED Final   Rhinovirus / Enterovirus NOT DETECTED NOT DETECTED Final   Influenza A NOT DETECTED NOT DETECTED Final   Influenza B NOT DETECTED NOT DETECTED Final   Parainfluenza Virus 1 NOT DETECTED NOT DETECTED Final   Parainfluenza Virus 2 NOT DETECTED NOT DETECTED Final   Parainfluenza Virus 3 NOT DETECTED NOT DETECTED Final   Parainfluenza Virus 4 NOT DETECTED NOT DETECTED Final   Respiratory Syncytial Virus NOT DETECTED NOT DETECTED Final   Bordetella pertussis NOT DETECTED NOT DETECTED Final   Bordetella Parapertussis NOT DETECTED NOT DETECTED Final   Chlamydophila pneumoniae NOT DETECTED NOT DETECTED Final   Mycoplasma pneumoniae NOT DETECTED NOT DETECTED Final    Comment: Performed at [facility], [address], [place], [place] [address]      Radiology Studies last 3 days: [facility] Chest Port 1 View  Result Date: 09/06/2022 CLINICAL DATA:  Abnormal lung sounds EXAM: PORTABLE CHEST 1 VIEW COMPARISON:  Chest x-ray dated April 29, 2022 FINDINGS: Unchanged cardiomegaly. Loop recorder device. Right chest wall port. New diffuse interstitial opacities. No large pleural effusion. No evidence of pneumothorax. IMPRESSION: New diffuse reticulonodular opacities, likely due to edema or infection. Electronically Signed   By: Yetta Glassman M.D.   On: 09/06/2022 08:16             LOS: 9 days      Emeterio Reeve, DO [facility] 09/06/2022, 11:48 AM    Dictation software may have been used to generate  the above  note. Typos may occur and escape review in typed/dictated notes. Please contact Dr Sheppard Coil directly for clarity if needed.  Staff may message me via secure chat in [place]  but this may not receive an immediate response,  please page me for urgent matters!  If 7PM-7AM, please contact night coverage [email or web address]

## 2022-09-06 NOTE — Progress Notes (Signed)
[facility]  ROUNDING NOTE   Subjective:   Cynthia Dean s is a 72 y.o. 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 Patient presents to ED from dialysis with abnormal labs, hgb 5.7. She has been admitted under observation for Chronic blood loss anemia [D50.0] Gastrointestinal hemorrhage, unspecified gastrointestinal hemorrhage type [K92.2] Anemia, unspecified type [D64.9] Hemoglobin drop [R71.0]  Patient is known to our practice and receives outpatient dialysis treatments at [facility] on a MWF schedule, supervised by Dr. Holley Raring.    Patient sitting up in bed Appears in good spirits, smiling Denies pain or discomfort Tolerating small meals  Objective:  Vital signs in last 24 hours:  Temp:  [98 F (36.7 C)-100 F (37.8 C)] 98 F (36.7 C) (02/13 0752) Pulse Rate:  [72-81] 72 (02/13 0752) Resp:  [16-20] 20 (02/13 0752) BP: (124-150)/(36-66) 124/66 (02/13 0752) SpO2:  [91 %-99 %] 99 % (02/13 0752)  Weight change:  Filed Weights   09/02/22 1211 09/05/22 0831 09/05/22 1250  Weight: 104.3 kg 105.9 kg 103.7 kg    Intake/Output: I/O last 3 completed shifts: In: 442.5 [Blood:442.5] Out: 850 [Urine:150; Other:700]   Intake/Output this shift:  Total I/O In: -  Out: 250 [Urine:250]  Physical Exam: General: NAD  Head: Normocephalic, atraumatic. Moist oral mucosal membranes  Eyes: Anicteric  Lungs:  Clear to auscultation, normal effort  Heart: Regular rate and rhythm  Abdomen:  Soft, nontender  Extremities:  No peripheral edema.  Neurologic: Alert and oriented, moving all four extremities  Skin: No lesions  Access: Lt AVG    Basic Metabolic Panel: Recent Labs  Lab 08/31/22 0413 09/01/22 0353 09/02/22 0529 09/05/22 0847  NA 138 135 133* 134*  K 4.3 4.8 4.3 5.0  CL 102 99 98 96*  CO2 26 26 27 26  $ GLUCOSE 104*  104* 151* 129*  BUN 43* 53* 44* 72*  CREATININE 7.53* 9.13* 7.43* 9.24*  CALCIUM 8.3* 8.2* 8.0* 8.6*  MG 1.8 1.7 1.7  --   PHOS  --   --   --  6.2*     Liver Function Tests: Recent Labs  Lab 09/05/22 0847  ALBUMIN 2.5*    No results for input(s): "LIPASE", "AMYLASE" in the last 168 hours. No results for input(s): "AMMONIA" in the last 168 hours.  CBC: Recent Labs  Lab 08/31/22 0413 09/01/22 0353 09/02/22 0529 09/04/22 0436 09/05/22 0847 09/05/22 2214  WBC 8.5 7.9 9.3 10.2 9.1  --   HGB 7.5* 7.0* 7.9* 7.5* 6.8* 8.2*  HCT 24.0* 22.4* 24.6* 24.1* 21.6* 25.5*  MCV 85.4 85.5 84.5 85.5 84.4  --   PLT 175 167 165 183 206  --      Cardiac Enzymes: No results for input(s): "CKTOTAL", "CKMB", "CKMBINDEX", "TROPONINI" in the last 168 hours.  BNP: Invalid input(s): "POCBNP"  CBG: Recent Labs  Lab 09/05/22 1343 09/05/22 1711 09/05/22 2105 09/06/22 0727 09/06/22 1144  GLUCAP 120* 150* 145* 128* 176*     Microbiology: Results for orders placed or performed during the hospital encounter of 08/26/22  MRSA Next Gen by PCR, Nasal     Status: None   Collection Time: 08/28/22  4:20 PM   Specimen: Nasal Mucosa; Nasal Swab  Result Value Ref Range Status   MRSA by PCR Next Gen NOT DETECTED NOT DETECTED Final    Comment: (NOTE) The GeneXpert MRSA Assay (FDA approved for NASAL specimens only),  is one component of a comprehensive MRSA colonization surveillance program. It is not intended to diagnose MRSA infection nor to guide or monitor treatment for MRSA infections. Test performance is not FDA approved in patients less than 38 years old. Performed at [facility], [address] [place], [place] [address]   Gastrointestinal Panel by PCR , Stool     Status: None   Collection Time: 08/29/22  3:44 PM   Specimen: Stool  Result Value Ref Range Status   Campylobacter species NOT DETECTED NOT DETECTED Final   Plesimonas shigelloides NOT DETECTED NOT DETECTED  Final   Salmonella species NOT DETECTED NOT DETECTED Final   Yersinia enterocolitica NOT DETECTED NOT DETECTED Final   Vibrio species NOT DETECTED NOT DETECTED Final   Vibrio cholerae NOT DETECTED NOT DETECTED Final   Enteroaggregative E coli (EAEC) NOT DETECTED NOT DETECTED Final   Enteropathogenic E coli (EPEC) NOT DETECTED NOT DETECTED Final   Enterotoxigenic E coli (ETEC) NOT DETECTED NOT DETECTED Final   Shiga like toxin producing E coli (STEC) NOT DETECTED NOT DETECTED Final   Shigella/Enteroinvasive E coli (EIEC) NOT DETECTED NOT DETECTED Final   Cryptosporidium NOT DETECTED NOT DETECTED Final   Cyclospora cayetanensis NOT DETECTED NOT DETECTED Final   Entamoeba histolytica NOT DETECTED NOT DETECTED Final   Giardia lamblia NOT DETECTED NOT DETECTED Final   Adenovirus F40/41 NOT DETECTED NOT DETECTED Final   Astrovirus NOT DETECTED NOT DETECTED Final   Norovirus GI/GII NOT DETECTED NOT DETECTED Final   Rotavirus A NOT DETECTED NOT DETECTED Final   Sapovirus (I, II, IV, and V) NOT DETECTED NOT DETECTED Final    Comment: Performed at [facility], [address] [place], [place] [address]  Resp panel by RT-PCR (RSV, Flu A&B, Covid) Anterior Nasal Swab     Status: None   Collection Time: 09/05/22  1:52 PM   Specimen: Anterior Nasal Swab  Result Value Ref Range Status   SARS Coronavirus 2 by RT PCR NEGATIVE NEGATIVE Final    Comment: (NOTE) SARS-CoV-2 target nucleic acids are NOT DETECTED.  The SARS-CoV-2 RNA is generally detectable in upper respiratory specimens during the acute phase of infection. The lowest concentration of SARS-CoV-2 viral copies this assay can detect is 138 copies/mL. A negative result does not preclude SARS-Cov-2 infection and should not be used as the sole basis for treatment or other patient management decisions. A negative result may occur with  improper specimen collection/handling, submission of specimen other than nasopharyngeal swab,  presence of viral mutation(s) within the areas targeted by this assay, and inadequate number of viral copies(<138 copies/mL). A negative result must be combined with clinical observations, patient history, and epidemiological information. The expected result is Negative.  Fact Sheet for Patients:  EntrepreneurPulse.com.au  Fact Sheet for Healthcare Providers:  IncredibleEmployment.be  This test is no t yet approved or cleared by the [place] FDA and  has been authorized for detection and/or diagnosis of SARS-CoV-2 by FDA under an Emergency Use Authorization (EUA). This EUA will remain  in effect (meaning this test can be used) for the duration of the COVID-19 declaration under Section 564(b)(1) of the Act, 21 U.S.C.section 360bbb-3(b)(1), unless the authorization is terminated  or revoked sooner.       Influenza A by PCR NEGATIVE NEGATIVE Final   Influenza B by PCR NEGATIVE NEGATIVE Final    Comment: (NOTE) The Xpert Xpress SARS-CoV-2/FLU/RSV plus assay is intended as an aid in the diagnosis of influenza from Nasopharyngeal swab  specimens and should not be used as a sole basis for treatment. Nasal washings and aspirates are unacceptable for Xpert Xpress SARS-CoV-2/FLU/RSV testing.  Fact Sheet for Patients: EntrepreneurPulse.com.au  Fact Sheet for Healthcare Providers: IncredibleEmployment.be  This test is not yet approved or cleared by the [place] FDA and has been authorized for detection and/or diagnosis of SARS-CoV-2 by FDA under an Emergency Use Authorization (EUA). This EUA will remain in effect (meaning this test can be used) for the duration of the COVID-19 declaration under Section 564(b)(1) of the Act, 21 U.S.C. section 360bbb-3(b)(1), unless the authorization is terminated or revoked.     Resp Syncytial Virus by PCR NEGATIVE NEGATIVE Final    Comment: (NOTE) Fact Sheet for  Patients: EntrepreneurPulse.com.au  Fact Sheet for Healthcare Providers: IncredibleEmployment.be  This test is not yet approved or cleared by the [place] FDA and has been authorized for detection and/or diagnosis of SARS-CoV-2 by FDA under an Emergency Use Authorization (EUA). This EUA will remain in effect (meaning this test can be used) for the duration of the COVID-19 declaration under Section 564(b)(1) of the Act, 21 U.S.C. section 360bbb-3(b)(1), unless the authorization is terminated or revoked.  Performed at [facility] [place], [place] [address]   Respiratory (~20 pathogens) panel by PCR     Status: None   Collection Time: 09/05/22  2:50 PM  Result Value Ref Range Status   Adenovirus NOT DETECTED NOT DETECTED Final   Coronavirus 229E NOT DETECTED NOT DETECTED Final    Comment: (NOTE) The Coronavirus on the Respiratory Panel, DOES NOT test for the novel  Coronavirus (2019 nCoV)    Coronavirus HKU1 NOT DETECTED NOT DETECTED Final   Coronavirus NL63 NOT DETECTED NOT DETECTED Final   Coronavirus OC43 NOT DETECTED NOT DETECTED Final   Metapneumovirus NOT DETECTED NOT DETECTED Final   Rhinovirus / Enterovirus NOT DETECTED NOT DETECTED Final   Influenza A NOT DETECTED NOT DETECTED Final   Influenza B NOT DETECTED NOT DETECTED Final   Parainfluenza Virus 1 NOT DETECTED NOT DETECTED Final   Parainfluenza Virus 2 NOT DETECTED NOT DETECTED Final   Parainfluenza Virus 3 NOT DETECTED NOT DETECTED Final   Parainfluenza Virus 4 NOT DETECTED NOT DETECTED Final   Respiratory Syncytial Virus NOT DETECTED NOT DETECTED Final   Bordetella pertussis NOT DETECTED NOT DETECTED Final   Bordetella Parapertussis NOT DETECTED NOT DETECTED Final   Chlamydophila pneumoniae NOT DETECTED NOT DETECTED Final   Mycoplasma pneumoniae NOT DETECTED NOT DETECTED Final    Comment: Performed at [facility], [address]  [place], [place] [address]    Coagulation Studies: No results for input(s): "LABPROT", "INR" in the last 72 hours.  Urinalysis: No results for input(s): "COLORURINE", "LABSPEC", "PHURINE", "GLUCOSEU", "HGBUR", "BILIRUBINUR", "KETONESUR", "PROTEINUR", "UROBILINOGEN", "NITRITE", "LEUKOCYTESUR" in the last 72 hours.  Invalid input(s): "APPERANCEUR"    Imaging: DG Chest Port 1 View  Result Date: 09/06/2022 CLINICAL DATA:  Abnormal lung sounds EXAM: PORTABLE CHEST 1 VIEW COMPARISON:  Chest x-ray dated April 29, 2022 FINDINGS: Unchanged cardiomegaly. Loop recorder device. Right chest wall port. New diffuse interstitial opacities. No large pleural effusion. No evidence of pneumothorax. IMPRESSION: New diffuse reticulonodular opacities, likely due to edema or infection. Electronically Signed   By: Yetta Glassman M.D.   On: 09/06/2022 08:16     Medications:    anticoagulant sodium citrate      aspirin EC  81 mg Oral Daily   atorvastatin  80 mg Oral  QPM   Chlorhexidine Gluconate Cloth  6 each Topical Q0600   cinacalcet  30 mg Oral Q breakfast   epoetin (EPOGEN/PROCRIT) injection  10,000 Units Intravenous Q M,W,F-HD   gabapentin  100 mg Oral BID   insulin aspart  0-6 Units Subcutaneous TID WC   Ipratropium-Albuterol  1 puff Inhalation Q6H   levETIRAcetam  1,000 mg Oral Daily   levETIRAcetam  250 mg Oral Once per day on Mon Wed Fri   losartan  100 mg Oral QHS   metoprolol succinate  100 mg Oral Daily   pantoprazole  40 mg Oral Daily   polyethylene glycol  17 g Oral BID   senna-docusate  2 tablet Oral BID   sertraline  25 mg Oral Daily   torsemide  100 mg Oral Daily   traZODone  50 mg Oral QHS   acetaminophen **OR** acetaminophen, albuterol, alteplase, anticoagulant sodium citrate, heparin, HYDROcodone-acetaminophen, lidocaine (PF), lidocaine-prilocaine, pentafluoroprop-tetrafluoroeth  Assessment/ Plan:  Ms. Xariyah Chao is a 72 y.o.  female with past medical conditions  including CAD, COPD with intermittent home O2, hypertension, hyperlipidemia, stroke, diabetes and end stage renal disease on hemodialysis. Patient presents to ED from dialysis with abnormal labs, hgb 5.7. She has been admitted under observation for Chronic blood loss anemia [D50.0] Gastrointestinal hemorrhage, unspecified gastrointestinal hemorrhage type [K92.2] Anemia, unspecified type [D64.9] Hemoglobin drop [R71.0]  [facility]/MWF/Lt AVG   1. End stage renal disease on hemodialysis. Dialysis received yesterday, UF 733m achieved. UF turned off during treatment due to decreased hemoglobin. Next treatment scheduled for Wednesday.  2. Anemia of chronic kidney disease with acute blood loss Lab Results  Component Value Date   HGB 8.2 (L) 09/05/2022   Hgb on admission 5.7 with positive guaiac. Patient has received 3 unit blood transfusion during this admission. GI consulted and EGD on 08/28/22, no signs of active bleeding. Colonoscopy on 08/31/22 negative for active bleeding, blood clots and pooling noted in colon, believed to be diverticular in nature.   Hgb improved with blood transfusion. Continue EPO with dialysis treatments.   3. Hypertension with chronic kidney disease. Home regimen includes losartan, metoprolol, and torsemide. Currently receiving these medications.  Blood pressure acceptable for this patient.   4. Secondary Hyperparathyroidism: with outpatient labs: PTH 613, phosphorus 4.8, calcium 9.0 on 03/07/22.    :  Lab Results  Component Value Date   PTH 131 (H) 03/14/2018   CALCIUM 8.6 (L) 09/05/2022   CAION 1.09 (L) 08/28/2022   PHOS 6.2 (H) 09/05/2022    Will monitor bone minerals during this admission for now. Phosphorus remains elevated.  Continue daily cinacalcet.    LOS: 9[place]2/13/20241:11 PM

## 2022-09-06 NOTE — Progress Notes (Signed)
Physical Therapy Treatment Patient Details Name: Cynthia Dean MRN: [id number] DOB: Nov 24, 1950 Today's Date: 09/06/2022   History of Present Illness Pt is a 72 y.o. female with medical history significant for ESRD on HD MWF, insulin requiring T2DM, COPD on 4LO2, CAD, carotid stenosis, seizure disorder, HTN, prior CVA, GERD, GI bleeding, left breast CA s/p partial mastectomy and left axillary dissection, small bilateral PE currently on Eliquis, who was sent from dialysis to the ED 08/26/2022 for evaluation of hemoglobin of 5.7.  MD assessment includes: Acute on chronic blood loss anemia and anemia of chronic disease.    PT Comments    Pt with mild lethargy and very limited verbal communication during the session.  Pt required extensive cuing and +2 physical assist with all functional tasks.  Once in sitting at the EOB pt presented with significant L lateral lean that improved with R lateral weight shifting activities but instability then progressed to anterior instability that did not resolve during the session.  Pt was able to come to standing from the EOB x 3 with extensive +2 assist and with pt holding and pulling up from the sink counter with max standing time 15-30 sec during each stand.  Some R sided neglect noted at the end of the session with MD notified.  Pt will benefit from PT services in a SNF setting upon discharge to safely address deficits listed in patient problem list for decreased caregiver assistance and eventual return to PLOF.    Recommendations for follow up therapy are one component of a multi-disciplinary discharge planning process, led by the attending physician.  Recommendations may be updated based on patient status, additional functional criteria and insurance authorization.  Follow Up Recommendations  Skilled nursing-short term rehab (<3 hours/day) Can patient physically be transported by private vehicle: No   Assistance Recommended at Discharge Frequent or  constant Supervision/Assistance  Patient can return home with the following Two people to help with walking and/or transfers;Two people to help with bathing/dressing/bathroom;Assistance with cooking/housework;Direct supervision/assist for medications management;Assist for transportation;Help with stairs or ramp for entrance   Equipment Recommendations  Other (comment) (TBD at next venue of care)    Recommendations for Other Services       Precautions / Restrictions Precautions Precautions: Fall Restrictions Weight Bearing Restrictions: No Other Position/Activity Restrictions: h/o seizures     Mobility  Bed Mobility Overal bed mobility: Needs Assistance Bed Mobility: Supine to Sit, Sit to Supine     Supine to sit: Total assist, +2 for physical assistance Sit to supine: Total assist, +2 for physical assistance   General bed mobility comments: Total assist for BLE and trunk control    Transfers Overall transfer level: Needs assistance   Transfers: Sit to/from Stand Sit to Stand: +2 physical assistance, Max assist           General transfer comment: Pt able to come to partial stand with +2 max A with pt pulling up from sink counter; max standing tolerance around 30 sec but only with extensive +2 assist    Ambulation/Gait               General Gait Details: unable   Stairs             Wheelchair Mobility    Modified Rankin (Stroke Patients Only)       Balance Overall balance assessment: Needs assistance Sitting-balance support: Bilateral upper extremity supported Sitting balance-Leahy Scale: Poor Sitting balance - Comments: L lateral lean that improved with weight shifting  to the R followed by anterior lean; frequent min to mod A to achieve neutral sitting position Postural control: Left lateral lean Standing balance support: Bilateral upper extremity supported Standing balance-Leahy Scale: Poor                               Cognition Arousal/Alertness: Lethargic Behavior During Therapy: Flat affect                                            Exercises Other Exercises Other Exercises: R lateral and posterior weight shifting activities in sitting to address poor static sitting balance    General Comments        Pertinent Vitals/Pain Pain Assessment Pain Assessment: No/denies pain    Home Living                          Prior Function            PT Goals (current goals can now be found in the care plan section) Progress towards PT goals: PT to reassess next treatment    Frequency    Min 2X/week      PT Plan Current plan remains appropriate    Co-evaluation PT/OT/SLP Co-Evaluation/Treatment: Yes Reason for Co-Treatment: For patient/therapist safety;To address functional/ADL transfers PT goals addressed during session: Mobility/safety with mobility OT goals addressed during session: ADL's and self-care      AM-PAC PT "6 Clicks" Mobility   Outcome Measure  Help needed turning from your back to your side while in a flat bed without using bedrails?: Total Help needed moving from lying on your back to sitting on the side of a flat bed without using bedrails?: Total Help needed moving to and from a bed to a chair (including a wheelchair)?: Total Help needed standing up from a chair using your arms (e.g., wheelchair or bedside chair)?: Total Help needed to walk in hospital room?: Total Help needed climbing 3-5 steps with a railing? : Total 6 Click Score: 6    End of Session Equipment Utilized During Treatment: Gait belt;Oxygen Activity Tolerance: Patient tolerated treatment well Patient left: in bed;with call bell/phone within reach;Other (comment) (Pt left with OT and CNA for hygiene) Nurse Communication: Mobility status;Other (comment) (MD notified of decline in status including poor static sitting balance and some R-sided neglect) PT Visit Diagnosis:  Difficulty in walking, not elsewhere classified (R26.2);Muscle weakness (generalized) (M62.81)     Time: [id number] PT Time Calculation (min) (ACUTE ONLY): 23 min  Charges:  $Therapeutic Activity: 8-22 mins                    D. Scott Anthoni Geerts PT, DPT 09/06/22, 4:16 PM

## 2022-09-07 DIAGNOSIS — D62 Acute posthemorrhagic anemia: Secondary | ICD-10-CM | POA: Diagnosis not present

## 2022-09-07 LAB — CBC
HCT: 25.4 % — ABNORMAL LOW (ref 36.0–46.0)
Hemoglobin: 8 g/dL — ABNORMAL LOW (ref 12.0–15.0)
MCH: 26.5 pg (ref 26.0–34.0)
MCHC: 31.5 g/dL (ref 30.0–36.0)
MCV: 84.1 fL (ref 80.0–100.0)
Platelets: 235 10*3/uL (ref 150–400)
RBC: 3.02 MIL/uL — ABNORMAL LOW (ref 3.87–5.11)
RDW: 15.9 % — ABNORMAL HIGH (ref 11.5–15.5)
WBC: 11.8 10*3/uL — ABNORMAL HIGH (ref 4.0–10.5)
nRBC: 0 % (ref 0.0–0.2)

## 2022-09-07 LAB — BASIC METABOLIC PANEL
Anion gap: 13 (ref 5–15)
BUN: 65 mg/dL — ABNORMAL HIGH (ref 8–23)
CO2: 26 mmol/L (ref 22–32)
Calcium: 9.1 mg/dL (ref 8.9–10.3)
Chloride: 96 mmol/L — ABNORMAL LOW (ref 98–111)
Creatinine, Ser: 8.58 mg/dL — ABNORMAL HIGH (ref 0.44–1.00)
GFR, Estimated: 5 mL/min — ABNORMAL LOW (ref >60)
Glucose, Bld: 135 mg/dL — ABNORMAL HIGH (ref 70–99)
Potassium: 4.9 mmol/L (ref 3.5–5.1)
Sodium: 135 mmol/L (ref 135–145)

## 2022-09-07 LAB — HEMOGLOBIN AND HEMATOCRIT, BLOOD
HCT: 28 % — ABNORMAL LOW (ref 36.0–46.0)
Hemoglobin: 8.9 g/dL — ABNORMAL LOW (ref 12.0–15.0)

## 2022-09-07 LAB — GLUCOSE, CAPILLARY
Glucose-Capillary: 116 mg/dL — ABNORMAL HIGH (ref 70–99)
Glucose-Capillary: 128 mg/dL — ABNORMAL HIGH (ref 70–99)
Glucose-Capillary: 124 mg/dL — ABNORMAL HIGH (ref 70–99)

## 2022-09-07 MED ORDER — EPOETIN ALFA 10000 UNIT/ML IJ SOLN
INTRAMUSCULAR | Status: AC
  Filled 2022-09-07: qty 1

## 2022-09-07 NOTE — Progress Notes (Signed)
Pt was brought down to dialysis unit with bm on her. Dialysis staff had to clean her up before getting her treatment started.

## 2022-09-07 NOTE — Progress Notes (Signed)
During pre assessment, patient has a large bowel movement and is brownish red in color. Floor RN and Hemodialysis NP were notified.

## 2022-09-07 NOTE — Plan of Care (Signed)

## 2022-09-07 NOTE — TOC Progression Note (Signed)
Transition of Care [facility]) - Progression Note    Patient Details  Name: Cynthia Dean MRN: [id number] Date of Birth: 1950-12-07  [facility]) CM/SW Contact  Gerilyn Pilgrim, LCSW Phone Number: 09/07/2022, 9:29 AM  Clinical Narrative:   Josem Kaufmann started for [facility].          Expected Discharge Plan and Services         Expected Discharge Date: 09/02/22                                     Social Determinants of Health (SDOH) Interventions SDOH Screenings   Food Insecurity: No Food Insecurity (08/27/2022)  Housing: Low Risk  (08/27/2022)  Transportation Needs: No Transportation Needs (08/27/2022)  Utilities: Not At Risk (08/27/2022)  Financial Resource Strain: Low Risk  (11/12/2017)  Physical Activity: Insufficiently Active (11/12/2017)  Social Connections: Moderately Integrated (11/12/2017)  Stress: No Stress Concern Present (11/12/2017)  Tobacco Use: Medium Risk (09/01/2022)    Readmission Risk Interventions     No data to display

## 2022-09-07 NOTE — TOC Progression Note (Signed)
Transition of Care [facility]) - Progression Note    Patient Details  Name: Cynthia Dean MRN: [id number] Date of Birth: 02-Jul-1951  Transition of Care [facility]) CM/SW Contact  Gerilyn Pilgrim, LCSW Phone Number: 09/07/2022, 2:01 PM  Clinical Narrative:   Josem Kaufmann obtained for [facility]. Per MD pt is not medically ready. CSW to follow.          Expected Discharge Plan and Services         Expected Discharge Date: 09/02/22                                     Social Determinants of Health (SDOH) Interventions SDOH Screenings   Food Insecurity: No Food Insecurity (08/27/2022)  Housing: Low Risk  (08/27/2022)  Transportation Needs: No Transportation Needs (08/27/2022)  Utilities: Not At Risk (08/27/2022)  Financial Resource Strain: Low Risk  (11/12/2017)  Physical Activity: Insufficiently Active (11/12/2017)  Social Connections: Moderately Integrated (11/12/2017)  Stress: No Stress Concern Present (11/12/2017)  Tobacco Use: Medium Risk (09/01/2022)    Readmission Risk Interventions     No data to display

## 2022-09-07 NOTE — Progress Notes (Signed)
[facility]  ROUNDING NOTE   Subjective:   Cynthia Dean s is a 72 y.o. 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 Patient presents to ED from dialysis with abnormal labs, hgb 5.7. She has been admitted under observation for Chronic blood loss anemia [D50.0] Gastrointestinal hemorrhage, unspecified gastrointestinal hemorrhage type [K92.2] Anemia, unspecified type [D64.9] Hemoglobin drop [R71.0]  Patient is known to our practice and receives outpatient dialysis treatments at [facility] on a MWF schedule, supervised by Dr. Holley Raring.    Patient seen and evaluated during dialysis   HEMODIALYSIS FLOWSHEET:  Blood Flow Rate (mL/min): 400 mL/min Arterial Pressure (mmHg): -180 mmHg Venous Pressure (mmHg): 200 mmHg TMP (mmHg): 4 mmHg Ultrafiltration Rate (mL/min): 543 mL/min Dialysate Flow Rate (mL/min): 300 ml/min Dialysis Fluid Bolus: Normal Saline Bolus Amount (mL): 300 mL  Tolerating treatment well HD RN reported bloody BM prior to initiation of treatment.  Objective:  Vital signs in last 24 hours:  Temp:  [98.3 F (36.8 C)-98.7 F (37.1 C)] 98.3 F (36.8 C) (02/14 0821) Pulse Rate:  [67-71] 70 (02/14 1000) Resp:  [15-20] 18 (02/14 1000) BP: (142-181)/(64-75) 181/73 (02/14 1000) SpO2:  [97 %-100 %] 97 % (02/14 1000) Weight:  [106.4 kg] 106.4 kg (02/14 0821)  Weight change:  Filed Weights   09/05/22 0831 09/05/22 1250 09/07/22 0821  Weight: 105.9 kg 103.7 kg 106.4 kg    Intake/Output: I/O last 3 completed shifts: In: 1159.5 [P.O.:717; Blood:442.5] Out: 400 [Urine:400]   Intake/Output this shift:  No intake/output data recorded.  Physical Exam: General: NAD  Head: Normocephalic, atraumatic. Moist oral mucosal membranes  Eyes: Anicteric  Lungs:  Clear to auscultation, normal effort  Heart: Regular rate and  rhythm  Abdomen:  Soft, nontender  Extremities:  No peripheral edema.  Neurologic: Alert and oriented, moving all four extremities  Skin: No lesions  Access: Lt AVG    Basic Metabolic Panel: Recent Labs  Lab 09/01/22 0353 09/02/22 0529 09/05/22 0847 09/07/22 0853  NA 135 133* 134* 135  K 4.8 4.3 5.0 4.9  CL 99 98 96* 96*  CO2 26 27 26 26  $ GLUCOSE 104* 151* 129* 135*  BUN 53* 44* 72* 65*  CREATININE 9.13* 7.43* 9.24* 8.58*  CALCIUM 8.2* 8.0* 8.6* 9.1  MG 1.7 1.7  --   --   PHOS  --   --  6.2*  --      Liver Function Tests: Recent Labs  Lab 09/05/22 0847  ALBUMIN 2.5*    No results for input(s): "LIPASE", "AMYLASE" in the last 168 hours. No results for input(s): "AMMONIA" in the last 168 hours.  CBC: Recent Labs  Lab 09/01/22 0353 09/02/22 0529 09/04/22 0436 09/05/22 0847 09/05/22 2214 09/07/22 0504  WBC 7.9 9.3 10.2 9.1  --  11.8*  HGB 7.0* 7.9* 7.5* 6.8* 8.2* 8.0*  HCT 22.4* 24.6* 24.1* 21.6* 25.5* 25.4*  MCV 85.5 84.5 85.5 84.4  --  84.1  PLT 167 165 183 206  --  235     Cardiac Enzymes: No results for input(s): "CKTOTAL", "CKMB", "CKMBINDEX", "TROPONINI" in the last 168 hours.  BNP: Invalid input(s): "POCBNP"  CBG: Recent Labs  Lab 09/05/22 2105 09/06/22 0727 09/06/22 1144 09/06/22 1656 09/06/22 2120  GLUCAP 145* 128* 176* 132* 29*     Microbiology: Results for orders placed or performed during the hospital encounter of 08/26/22  MRSA Next Gen by PCR,  Nasal     Status: None   Collection Time: 08/28/22  4:20 PM   Specimen: Nasal Mucosa; Nasal Swab  Result Value Ref Range Status   MRSA by PCR Next Gen NOT DETECTED NOT DETECTED Final    Comment: (NOTE) The GeneXpert MRSA Assay (FDA approved for NASAL specimens only), is one component of a comprehensive MRSA colonization surveillance program. It is not intended to diagnose MRSA infection nor to guide or monitor treatment for MRSA infections. Test performance is not FDA approved in  patients less than 24 years old. Performed at [facility], [address] [place], [place] [address]   Gastrointestinal Panel by PCR , Stool     Status: None   Collection Time: 08/29/22  3:44 PM   Specimen: Stool  Result Value Ref Range Status   Campylobacter species NOT DETECTED NOT DETECTED Final   Plesimonas shigelloides NOT DETECTED NOT DETECTED Final   Salmonella species NOT DETECTED NOT DETECTED Final   Yersinia enterocolitica NOT DETECTED NOT DETECTED Final   Vibrio species NOT DETECTED NOT DETECTED Final   Vibrio cholerae NOT DETECTED NOT DETECTED Final   Enteroaggregative E coli (EAEC) NOT DETECTED NOT DETECTED Final   Enteropathogenic E coli (EPEC) NOT DETECTED NOT DETECTED Final   Enterotoxigenic E coli (ETEC) NOT DETECTED NOT DETECTED Final   Shiga like toxin producing E coli (STEC) NOT DETECTED NOT DETECTED Final   Shigella/Enteroinvasive E coli (EIEC) NOT DETECTED NOT DETECTED Final   Cryptosporidium NOT DETECTED NOT DETECTED Final   Cyclospora cayetanensis NOT DETECTED NOT DETECTED Final   Entamoeba histolytica NOT DETECTED NOT DETECTED Final   Giardia lamblia NOT DETECTED NOT DETECTED Final   Adenovirus F40/41 NOT DETECTED NOT DETECTED Final   Astrovirus NOT DETECTED NOT DETECTED Final   Norovirus GI/GII NOT DETECTED NOT DETECTED Final   Rotavirus A NOT DETECTED NOT DETECTED Final   Sapovirus (I, II, IV, and V) NOT DETECTED NOT DETECTED Final    Comment: Performed at [facility], [address] [place], [place] [address]  Resp panel by RT-PCR (RSV, Flu A&B, Covid) Anterior Nasal Swab     Status: None   Collection Time: 09/05/22  1:52 PM   Specimen: Anterior Nasal Swab  Result Value Ref Range Status   SARS Coronavirus 2 by RT PCR NEGATIVE NEGATIVE Final    Comment: (NOTE) SARS-CoV-2 target nucleic acids are NOT DETECTED.  The SARS-CoV-2 RNA is generally detectable in upper respiratory specimens during the acute phase of infection. The  lowest concentration of SARS-CoV-2 viral copies this assay can detect is 138 copies/mL. A negative result does not preclude SARS-Cov-2 infection and should not be used as the sole basis for treatment or other patient management decisions. A negative result may occur with  improper specimen collection/handling, submission of specimen other than nasopharyngeal swab, presence of viral mutation(s) within the areas targeted by this assay, and inadequate number of viral copies(<138 copies/mL). A negative result must be combined with clinical observations, patient history, and epidemiological information. The expected result is Negative.  Fact Sheet for Patients:  EntrepreneurPulse.com.au  Fact Sheet for Healthcare Providers:  IncredibleEmployment.be  This test is no t yet approved or cleared by the [place] FDA and  has been authorized for detection and/or diagnosis of SARS-CoV-2 by FDA under an Emergency Use Authorization (EUA). This EUA will remain  in effect (meaning this test can be used) for the duration of the COVID-19 declaration under Section 564(b)(1) of the Act, 21 U.S.C.section 360bbb-3(b)(1),  unless the authorization is terminated  or revoked sooner.       Influenza A by PCR NEGATIVE NEGATIVE Final   Influenza B by PCR NEGATIVE NEGATIVE Final    Comment: (NOTE) The Xpert Xpress SARS-CoV-2/FLU/RSV plus assay is intended as an aid in the diagnosis of influenza from Nasopharyngeal swab specimens and should not be used as a sole basis for treatment. Nasal washings and aspirates are unacceptable for Xpert Xpress SARS-CoV-2/FLU/RSV testing.  Fact Sheet for Patients: EntrepreneurPulse.com.au  Fact Sheet for Healthcare Providers: IncredibleEmployment.be  This test is not yet approved or cleared by the [place] FDA and has been authorized for detection and/or diagnosis of SARS-CoV-2 by FDA under  an Emergency Use Authorization (EUA). This EUA will remain in effect (meaning this test can be used) for the duration of the COVID-19 declaration under Section 564(b)(1) of the Act, 21 U.S.C. section 360bbb-3(b)(1), unless the authorization is terminated or revoked.     Resp Syncytial Virus by PCR NEGATIVE NEGATIVE Final    Comment: (NOTE) Fact Sheet for Patients: EntrepreneurPulse.com.au  Fact Sheet for Healthcare Providers: IncredibleEmployment.be  This test is not yet approved or cleared by the [place] FDA and has been authorized for detection and/or diagnosis of SARS-CoV-2 by FDA under an Emergency Use Authorization (EUA). This EUA will remain in effect (meaning this test can be used) for the duration of the COVID-19 declaration under Section 564(b)(1) of the Act, 21 U.S.C. section 360bbb-3(b)(1), unless the authorization is terminated or revoked.  Performed at [facility], [place], [place] [address]   Respiratory (~20 pathogens) panel by PCR     Status: None   Collection Time: 09/05/22  2:50 PM  Result Value Ref Range Status   Adenovirus NOT DETECTED NOT DETECTED Final   Coronavirus 229E NOT DETECTED NOT DETECTED Final    Comment: (NOTE) The Coronavirus on the Respiratory Panel, DOES NOT test for the novel  Coronavirus (2019 nCoV)    Coronavirus HKU1 NOT DETECTED NOT DETECTED Final   Coronavirus NL63 NOT DETECTED NOT DETECTED Final   Coronavirus OC43 NOT DETECTED NOT DETECTED Final   Metapneumovirus NOT DETECTED NOT DETECTED Final   Rhinovirus / Enterovirus NOT DETECTED NOT DETECTED Final   Influenza A NOT DETECTED NOT DETECTED Final   Influenza B NOT DETECTED NOT DETECTED Final   Parainfluenza Virus 1 NOT DETECTED NOT DETECTED Final   Parainfluenza Virus 2 NOT DETECTED NOT DETECTED Final   Parainfluenza Virus 3 NOT DETECTED NOT DETECTED Final   Parainfluenza Virus 4 NOT DETECTED NOT DETECTED Final    Respiratory Syncytial Virus NOT DETECTED NOT DETECTED Final   Bordetella pertussis NOT DETECTED NOT DETECTED Final   Bordetella Parapertussis NOT DETECTED NOT DETECTED Final   Chlamydophila pneumoniae NOT DETECTED NOT DETECTED Final   Mycoplasma pneumoniae NOT DETECTED NOT DETECTED Final    Comment: Performed at [facility], [address], [place], [place] [address]    Coagulation Studies: No results for input(s): "LABPROT", "INR" in the last 72 hours.  Urinalysis: No results for input(s): "COLORURINE", "LABSPEC", "PHURINE", "GLUCOSEU", "HGBUR", "BILIRUBINUR", "KETONESUR", "PROTEINUR", "UROBILINOGEN", "NITRITE", "LEUKOCYTESUR" in the last 72 hours.  Invalid input(s): "APPERANCEUR"    Imaging: DG Chest Port 1 View  Result Date: 09/06/2022 CLINICAL DATA:  Abnormal lung sounds EXAM: PORTABLE CHEST 1 VIEW COMPARISON:  Chest x-ray dated April 29, 2022 FINDINGS: Unchanged cardiomegaly. Loop recorder device. Right chest wall port. New diffuse interstitial opacities. No large pleural effusion. No evidence of pneumothorax.  IMPRESSION: New diffuse reticulonodular opacities, likely due to edema or infection. Electronically Signed   By: Yetta Glassman M.D.   On: 09/06/2022 08:16     Medications:    anticoagulant sodium citrate      aspirin EC  81 mg Oral Daily   atorvastatin  80 mg Oral QPM   Chlorhexidine Gluconate Cloth  6 each Topical Q0600   cinacalcet  30 mg Oral Q breakfast   epoetin (EPOGEN/PROCRIT) injection  10,000 Units Intravenous Q M,W,F-HD   gabapentin  100 mg Oral BID   insulin aspart  0-6 Units Subcutaneous TID WC   Ipratropium-Albuterol  1 puff Inhalation Q6H   levETIRAcetam  1,000 mg Oral Daily   levETIRAcetam  250 mg Oral Once per day on Mon Wed Fri   losartan  100 mg Oral QHS   metoprolol succinate  100 mg Oral Daily   pantoprazole  40 mg Oral Daily   sertraline  25 mg Oral Daily   torsemide  100 mg Oral Daily   traZODone  50 mg Oral QHS    acetaminophen **OR** acetaminophen, albuterol, alteplase, anticoagulant sodium citrate, heparin, HYDROcodone-acetaminophen, lidocaine (PF), lidocaine-prilocaine, pentafluoroprop-tetrafluoroeth  Assessment/ Plan:  [name] is a 72 y.o.  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 Patient presents to ED from dialysis with abnormal labs, hgb 5.7. She has been admitted under observation for Chronic blood loss anemia [D50.0] Gastrointestinal hemorrhage, unspecified gastrointestinal hemorrhage type [K92.2] Anemia, unspecified type [D64.9] Hemoglobin drop [R71.0]  [facility] N /MWF/Lt AVG   1. End stage renal disease on hemodialysis. Receiving dialysis today, UF goal 0.5L as tolerated. Next treatment scheduled for Friday.   2. Anemia of chronic kidney disease with acute blood loss Lab Results  Component Value Date   HGB 8.0 (L) 09/07/2022   Hgb on admission 5.7 with positive guaiac. Patient has received 3 unit blood transfusion during this admission. GI consulted and EGD on 08/28/22, no signs of active bleeding. Colonoscopy on 08/31/22 negative for active bleeding, blood clots and pooling noted in colon, believed to be diverticular in nature.   Hgb stable at 8.0. Suspected bloody BM at dialysis. Continue EPO with treatments.   3. Hypertension with chronic kidney disease. Home regimen includes losartan, metoprolol, and torsemide. Currently receiving these medications.  Blood pressure 174/68 during dialysis.  4. Secondary Hyperparathyroidism: with outpatient labs: PTH 613, phosphorus 4.8, calcium 9.0 on 03/07/22.    :  Lab Results  Component Value Date   PTH 131 (H) 03/14/2018   CALCIUM 9.1 09/07/2022   CAION 1.09 (L) 08/28/2022   PHOS 6.2 (H) 09/05/2022    Will monitor bone minerals during this admission for now. Phosphorus remains  elevated.  Continue daily cinacalcet.    LOS: [place] 2/14/202410:33 AM

## 2022-09-07 NOTE — Progress Notes (Signed)
Received patient in bed to unit.  Alert and oriented x2 Informed consent signed and in chart.   TX duration: 3.5 hours  Patient tolerated well.  Transported back to the room  Alert, without acute distress.  Hand-off given to patient's nurse.   Access used:  Left arm AVF Access issues: none  Total UF removed: 1L Medication(s) given: Epogen 10 000 units  Post HD weight: 103.6 kg   [facility]

## 2022-09-07 NOTE — Progress Notes (Signed)
Progress Note   [name] DOB: 1950/12/08 DOA: 08/26/2022     10 DOS: the patient was seen and examined on 09/07/2022   Brief hospital course: Cynthia Dean is a 72 y.o. female with medical history significant for ESRD on HD MWF, insulin requiring T2DM, COPD on home 4 L home O2, CAD, carotid stenosis, seizure disorder, HTN, prior CVA, GERD, GI bleeding 2020 with unremarkable EGD and colonoscopy, left breast ca s/p partial mastectomy and left axillary dissection on 0000000 complicated by left breast abscess with coag negative staph bacteremia as well as small bilateral PE 03/20/2022, currently on Eliquis, who was sent from dialysis to the ED 08/26/2022 for evaluation of hemoglobin of 5.7, down from baseline of around 7.6-8.3.  02/02 (+)guaiac stool in ED.  Transfused 3 units PRBC.  EGD on 02/04 no concerns. Continued on po protonix.  colonoscopy 02/07 (had been delayed d/t weekend and to dialysis schedule) (+)BRB, diverticular disease, no active bleed noted. GI suspect she had diverticular bleed. 09/01/22 Hgb 7.0, receiving another unit PRBC at HD.  Repeat HD morning, 02/09, CBC shows improved Hgb. Spoke w/ daughter and patient, all questions answered, confirmed stopping Eliquis - pt was set to discharge but then unable to stand up to get dressed, d/c cancelled and PT/OT to see if able to qualify for HH/SNF 02/10 PT recs for SNF, TOC consulted. 02/11 (Sunday) pending placement. 02/13: Cynthia Dean pending  02/14: Patient seen and examined in dialysis.  Appears stable and in no distress.  Was said to have had a bloody bowel movement during dialysis. Hemoglobin morning was 8      ASSESSMENT & PLAN:   Principal Problem:   Acute on chronic blood loss anemia Active Problems:   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-stage renal disease) (HCC)   History of lower GI bleeding   Chronic anticoagulation   History of pulmonary embolism 03/20/2022   ESRD on hemodialysis (HCC)   Chronic  diastolic CHF (congestive heart failure) (HCC)   CAD (coronary artery disease)   Essential hypertension   COPD (chronic obstructive pulmonary disease) (HCC)   Chronic respiratory failure with hypoxia (HCC)   Depression   Seizure disorder (HCC)   History of CVA (cerebrovascular accident)   Insulin-requiring or dependent type II diabetes mellitus ([place])   Breast cancer, left S/P partial mastectomy,03/04/2022      Cough, sinus congestion - improved CXR on personal review appears more pulmonary vascular congestion but ddx would include PNA vs viral respiratory illness +/- COPD - less likely given improvement in symptoms this morning, neg Flu/COVID/viral panel  Albuterol prn Improved with dialysis   Cognitive impairment / dementia Limits decision making capacity Stable   Acute on chronic blood loss anemia Anemia of chronic kidney disease Chronic anticoagulation with Eliquis - discontinued in shared decision making w/ her daughter  History of rectal bleeding 01/2019 with unremarkable EGD and colonoscopy baseline hemoglobin around 8.0 s/p total 4u pRBC this admission EGD, unremarkable Colonoscopy, diverticular disease, BRB, likely diverticular bleed  Continues to have bloody stools Discontinuing Eliquis (taking for PE since August 2023) Will discontinue aspirin Check serial H&H and will consider surgical consult if patient continues to require blood transfusion   Chronic anticoagulation History of PE 03/20/2022 S/p partial mastectomy for left breast cancer 03/04/2022 Discontinued Eliquis and aspirin in the setting of GI bleeding and adeuqate treatment for primary VTE   Chronic diastolic CHF (congestive heart failure) ([place]) Clinically euvolemic cont home torsemide   ESRD on hemodialysis [facility]) Nephrology  following Dialysis days are M/W/F   CAD (coronary artery disease) Continue  atorvastatin   Essential hypertension Continue metoprolol and torsemide cont home losartan nightly    COPD (chronic obstructive pulmonary disease) (HCC) Chronic respiratory failure on 4L O2 Not currently exacerbated Continue home inhalers and baseline oxygen   Depression Continue sertraline and trazodone   Seizure disorder (HCC) Continue Keppra   Breast cancer, left S/P partial mastectomy 03/04/2022 Her oncology team reached out for follow up but family declined. Patient's lumpectomy was complicated by abscess and staph bacteremia Patient says she does not think she wants to pursue any further treatment   Insulin-requiring or dependent type II diabetes mellitus ([place]) recent A1c 7.0 SSI achs   History of CVA (cerebrovascular accident) Continue atorvastatin Aspirin on hold due to GI bleed           Physical Exam: Vitals:   09/06/22 0752 09/06/22 1655 09/06/22 2116 09/07/22 0400  BP: 124/66 (!) 142/64 (!) 162/67 (!) 159/64  Pulse: 72 71 71 67  Resp: 20 17 20 19  $ Temp: 98 F (36.7 C) 98.5 F (36.9 C) 98.7 F (37.1 C) 98.6 F (37 C)  TempSrc: Oral   Axillary  SpO2: 99% 97%  100%  Weight:      Height:       Physical Exam Vitals and nursing note reviewed.  Constitutional:      Appearance: Normal appearance.  HENT:     Nose: Nose normal.     Mouth/Throat:     Mouth: Mucous membranes are moist.  Eyes:     Comments: Pale conjunctiva  Cardiovascular:     Rate and Rhythm: Normal rate and regular rhythm.  Pulmonary:     Effort: Pulmonary effort is normal.     Breath sounds: Normal breath sounds.  Abdominal:     General: Bowel sounds are normal.     Palpations: Abdomen is soft.     Comments: Central adiposity  Musculoskeletal:        General: Normal range of motion.     Cervical back: Normal range of motion and neck supple.  Skin:    General: Skin is warm and dry.  Neurological:     Mental Status: She is alert.     Motor: Weakness present.  Psychiatric:        Mood and Affect: Mood normal.        Behavior: Behavior normal.      Data Reviewed: Labs  reviewed.  Hemoglobin 8.0, white count 11.8, platelet count 235, creatinine 8.58, BUN 65 There are no new results to review at this time.  Family Communication: Called and spoke to patient's daughter Cynthia Dean over the phone.  Explained to her in detail to him what his discharge plans will be held for today due to continued bloody stools.  Will check H&H and transfuse as needed  Disposition: Status is: Inpatient Remains inpatient appropriate because: She continues to have bloody stools requiring blood transfusion  Planned Discharge Destination: Skilled nursing facility    Time spent: 35 minutes  Author: Collier Bullock, MD 09/07/2022 8:11 AM  For on call review [email or web address].

## 2022-09-08 DIAGNOSIS — D649 Anemia, unspecified: Secondary | ICD-10-CM

## 2022-09-08 LAB — GLUCOSE, CAPILLARY
Glucose-Capillary: 140 mg/dL — ABNORMAL HIGH (ref 70–99)
Glucose-Capillary: 170 mg/dL — ABNORMAL HIGH (ref 70–99)
Glucose-Capillary: 151 mg/dL — ABNORMAL HIGH (ref 70–99)
Glucose-Capillary: 152 mg/dL — ABNORMAL HIGH (ref 70–99)

## 2022-09-08 LAB — HEMOGLOBIN AND HEMATOCRIT, BLOOD
HCT: 24.2 % — ABNORMAL LOW (ref 36.0–46.0)
HCT: 25.8 % — ABNORMAL LOW (ref 36.0–46.0)
Hemoglobin: 7.6 g/dL — ABNORMAL LOW (ref 12.0–15.0)
Hemoglobin: 8 g/dL — ABNORMAL LOW (ref 12.0–15.0)

## 2022-09-08 NOTE — Progress Notes (Signed)
Physical Therapy Treatment Patient Details Name: Cynthia Dean MRN: [id number] DOB: Mar 13, 1951 Today's Date: 09/08/2022   History of Present Illness Pt is a 72 y.o. female with medical history significant for ESRD on HD MWF, insulin requiring T2DM, COPD on 4LO2, CAD, carotid stenosis, seizure disorder, HTN, prior CVA, GERD, GI bleeding, left breast CA s/p partial mastectomy and left axillary dissection, small bilateral PE currently on Eliquis, who was sent from dialysis to the ED 08/26/2022 for evaluation of hemoglobin of 5.7.  MD assessment includes: Acute on chronic blood loss anemia and anemia of chronic disease.    PT Comments    Pt generally fatigued and relatively flat affect but did show some effort with most requested acts, even if minimal or delayed.  She is profoundly weak in all extremities, especially R and struggled with mobility tasks needing +2 max/total assist for bed mobility and attempts at standing.  She did show good effort with supine exercises but needed essentially constant cuing and encouragement along with AAROM for most acts.  Pt will need continued PT to address functional limitations, POC remains appropriate.    Recommendations for follow up therapy are one component of a multi-disciplinary discharge planning process, led by the attending physician.  Recommendations may be updated based on patient status, additional functional criteria and insurance authorization.  Follow Up Recommendations  Skilled nursing-short term rehab (<3 hours/day) Can patient physically be transported by private vehicle: No   Assistance Recommended at Discharge Frequent or constant Supervision/Assistance  Patient can return home with the following Two people to help with walking and/or transfers;Two people to help with bathing/dressing/bathroom;Assistance with cooking/housework;Direct supervision/assist for medications management;Assist for transportation;Help with stairs or ramp for  entrance   Equipment Recommendations  Other (comment)    Recommendations for Other Services       Precautions / Restrictions Precautions Precautions: Fall Restrictions Weight Bearing Restrictions: No Other Position/Activity Restrictions: h/o seizures     Mobility  Bed Mobility Overal bed mobility: Needs Assistance Bed Mobility: Supine to Sit, Sit to Supine     Supine to sit: Total assist, +2 for physical assistance Sit to supine: Total assist, +2 for physical assistance   General bed mobility comments: Total assist for BLE and trunk control    Transfers Overall transfer level: Needs assistance Equipment used:  (pulling up at sink) Transfers: Sit to/from Stand Sit to Stand: +2 physical assistance, Total assist           General transfer comment: Took two attempts with bed more elevated on second attempt at standing, OT and PT blocking feet and knees, pt pulling on sink. Pt able to stand partial stand for approx 10 seconds - however initiation and maintainance of upward motion was with excessively heavy assist from therpists - struggled to really extend hips/knees/etc in a functional way    Ambulation/Gait                   Stairs             Wheelchair Mobility    Modified Rankin (Stroke Patients Only)       Balance Overall balance assessment: Needs assistance Sitting-balance support: Bilateral upper extremity supported, Feet supported Sitting balance-Leahy Scale: Fair Sitting balance - Comments: Pt initially with L lateral lean and posterior lean; needing lots of cues to motivate to lean forward and to R; pt stating she has back pain. Improved sitting balance with encouragement and about 1 minute of sitting EOB. Able to sit EOB  for approx 10 minues.   Standing balance support: Bilateral upper extremity supported Standing balance-Leahy Scale: Poor Standing balance comment: Unable to come to standing                             Cognition Arousal/Alertness: Awake/alert Behavior During Therapy: Flat affect Overall Cognitive Status: Impaired/Different from baseline                                 General Comments: slow to respond; one step commands; at times not responding to questions        Exercises Total Joint Exercises Ankle Circles/Pumps: AAROM, AROM, 10 reps, Strengthening (able to get some partial range bursts of R ankle PF/DF but poor control, lightly resisted on L) Quad Sets: Strengthening, Both, 5 reps, 10 reps Heel Slides: AAROM, Strengthening, Both, 10 reps, PROM (poor hip ADd control needing guidance to maintain neutral hip b/l, very little R hip flx/ext but able to elicit some minimal AROM effort with heavy cuing (verbal and tactile)) Hip ABduction/ADduction: AAROM, 10 reps    General Comments General comments (skin integrity, edema, etc.): pt reports "I'm too tired" and starts to try and lay back into bed after second standing attempt, could not convince her to try more standing efforts      Pertinent Vitals/Pain Pain Assessment Pain Assessment: Faces Faces Pain Scale: Hurts even more Pain Location: back pain with activity    Home Living                          Prior Function            PT Goals (current goals can now be found in the care plan section) Progress towards PT goals: Progressing toward goals    Frequency    Min 2X/week      PT Plan Current plan remains appropriate    Co-evaluation   Reason for Co-Treatment: For patient/therapist safety;To address functional/ADL transfers PT goals addressed during session: Mobility/safety with mobility OT goals addressed during session: Strengthening/ROM      AM-PAC PT "6 Clicks" Mobility   Outcome Measure  Help needed turning from your back to your side while in a flat bed without using bedrails?: Total Help needed moving from lying on your back to sitting on the side of a flat bed without using  bedrails?: Total Help needed moving to and from a bed to a chair (including a wheelchair)?: Total Help needed standing up from a chair using your arms (e.g., wheelchair or bedside chair)?: Total Help needed to walk in hospital room?: Total Help needed climbing 3-5 steps with a railing? : Total 6 Click Score: 6    End of Session Equipment Utilized During Treatment: Gait belt;Oxygen Activity Tolerance: Patient tolerated treatment well Patient left: in bed;with call bell/phone within reach;Other (comment) Nurse Communication: Mobility status;Other (comment) PT Visit Diagnosis: Difficulty in walking, not elsewhere classified (R26.2);Muscle weakness (generalized) (M62.81)     Time: [id number] PT Time Calculation (min) (ACUTE ONLY): 47 min  Charges:  $Therapeutic Exercise: 8-22 mins $Therapeutic Activity: 8-22 mins                     Kreg Shropshire, DPT 09/08/2022, 4:01 PM

## 2022-09-08 NOTE — Progress Notes (Signed)
Occupational Therapy Treatment Patient Details Name: Cynthia Dean MRN: [id number] DOB: [birth year] Today's Date: 09/08/2022   History of present illness Pt is a 72 y.o. female with medical history significant for ESRD on HD MWF, insulin requiring T2DM, COPD on 4LO2, CAD, carotid stenosis, seizure disorder, HTN, prior CVA, GERD, GI bleeding, left breast CA s/p partial mastectomy and left axillary dissection, small bilateral PE currently on Eliquis, who was sent from dialysis to the ED 08/26/2022 for evaluation of hemoglobin of 5.7.  MD assessment includes: Acute on chronic blood loss anemia and anemia of chronic disease.   OT comments  Pt received semi-reclined. Appearing lethargic, but awake; willing to work with OT on transfers. Max-total assist x2 for bed mobility and t/f See flowsheet below for further details of session. Left semi-reclined in bed with all needs in reach.  Patient will benefit from continued OT while in acute care.    Recommendations for follow up therapy are one component of a multi-disciplinary discharge planning process, led by the attending physician.  Recommendations may be updated based on patient status, additional functional criteria and insurance authorization.    Follow Up Recommendations  Skilled nursing-short term rehab (<3 hours/day)     Assistance Recommended at Discharge Frequent or constant Supervision/Assistance  Patient can return home with the following  Two people to help with walking and/or transfers;A lot of help with bathing/dressing/bathroom;Assistance with cooking/housework;Direct supervision/assist for medications management;Assist for transportation;A lot of help with walking and/or transfers;Two people to help with bathing/dressing/bathroom;Assistance with feeding   Equipment Recommendations  None recommended by OT    Recommendations for Other Services      Precautions / Restrictions Precautions Precautions:  Fall Restrictions Weight Bearing Restrictions: No Other Position/Activity Restrictions: h/o seizures       Mobility Bed Mobility Overal bed mobility: Needs Assistance Bed Mobility: Supine to Sit, Sit to Supine     Supine to sit: Total assist, +2 for physical assistance Sit to supine: Total assist, +2 for physical assistance        Transfers Overall transfer level: Needs assistance Equipment used:  (bed close to sink so that pt can pull up on sink/countertop) Transfers: Sit to/from Stand Sit to Stand: +2 physical assistance, Max assist           General transfer comment: Took two attempts with bed more elevated on second attempt at standing, OT and PT blocking feet and knees, pt pulling on sink. Pt able to stand partial stand for approx 10 seconds.     Balance   Sitting-balance support: Bilateral upper extremity supported, Feet supported Sitting balance-Leahy Scale: Poor Sitting balance - Comments: Pt initially with L lateral lean and posterior lean; needing lots of cues to motivate to lean forward; pt stating she has back pain. Improved sitting balance with encouragement and about 1 minute of sitting EOB. Able to sit EOB for approx 10 minues.                                   ADL either performed or assessed with clinical judgement   ADL                       Lower Body Dressing: Total assistance;Bed level Lower Body Dressing Details (indicate cue type and reason): dependent to don BIL socks and shoes  Extremity/Trunk Assessment Upper Extremity Assessment Upper Extremity Assessment: Generalized weakness   Lower Extremity Assessment Lower Extremity Assessment: Defer to PT evaluation;Generalized weakness        Vision       Perception     Praxis      Cognition Arousal/Alertness: Awake/alert Behavior During Therapy: Flat affect Overall Cognitive Status: Impaired/Different from baseline Area of Impairment:  Memory, Attention, Safety/judgement, Awareness                     Memory: Decreased short-term memory   Safety/Judgement: Decreased awareness of deficits, Decreased awareness of safety     General Comments: slow to respond; one step commands; at times not responding to questions        Exercises      Shoulder Instructions       General Comments After second stand, pt leaning L and not able to attempt again.    Pertinent Vitals/ Pain       Pain Assessment Pain Assessment: 0-10 Pain Score:  (unrated) Pain Location: back pain with activity Pain Descriptors / Indicators: Aching Pain Intervention(s): Limited activity within patient's tolerance  Home Living                                          Prior Functioning/Environment              Frequency  Min 2X/week        Progress Toward Goals  OT Goals(current goals can now be found in the care plan section)  Progress towards OT goals: Progressing toward goals (continue goals; pt lethargic)  Acute Rehab OT Goals Patient Stated Goal: get better OT Goal Formulation: With patient/family Time For Goal Achievement: 09/17/22 Potential to Achieve Goals: Fair ADL Goals Pt Will Perform Eating: with modified independence Pt Will Perform Upper Body Bathing: with mod assist Pt Will Perform Upper Body Dressing: with min assist Pt Will Perform Lower Body Dressing: with mod assist Pt Will Transfer to Toilet: with mod assist  Plan Discharge plan remains appropriate;Frequency remains appropriate    Co-evaluation      Reason for Co-Treatment: For patient/therapist safety;To address functional/ADL transfers   OT goals addressed during session: Strengthening/ROM      AM-PAC OT "6 Clicks" Daily Activity     Outcome Measure   Help from another person eating meals?: A Lot Help from another person taking care of personal grooming?: A Lot Help from another person toileting, which includes using  toliet, bedpan, or urinal?: Total Help from another person bathing (including washing, rinsing, drying)?: Total Help from another person to put on and taking off regular upper body clothing?: Total Help from another person to put on and taking off regular lower body clothing?: Total 6 Click Score: 8    End of Session Equipment Utilized During Treatment: Gait belt  OT Visit Diagnosis: Muscle weakness (generalized) (M62.81);Cognitive communication deficit (R41.841);History of falling (Z91.81)   Activity Tolerance Patient limited by lethargy   Patient Left in bed;with call bell/phone within reach;with bed alarm set;with family/visitor present   Nurse Communication Mobility status        Time: 1431-1455 OT Time Calculation (min): 24 min  Charges: OT General Charges $OT Visit: 1 Visit OT Treatments $Therapeutic Activity: 8-22 mins  Waymon Amato, MS, OTR/L   Vania Rea 09/08/2022, 3:48 PM

## 2022-09-08 NOTE — Progress Notes (Signed)
. Progress Note   [name] [id number] DOB: May 27, 1951 DOA: 08/26/2022     11 DOS: the patient was seen and examined on 09/08/2022   Brief hospital course: Cynthia Dean is a 72 y.o. female with medical history significant for ESRD on HD MWF, insulin requiring T2DM, COPD on home 4 L home O2, CAD, carotid stenosis, seizure disorder, HTN, prior CVA, GERD, GI bleeding 2020 with unremarkable EGD and colonoscopy, left breast ca s/p partial mastectomy and left axillary dissection on 0000000 complicated by left breast abscess with coag negative staph bacteremia as well as small bilateral PE 03/20/2022, currently on Eliquis, who was sent from dialysis to the ED 08/26/2022 for evaluation of hemoglobin of 5.7, down from baseline of around 7.6-8.3.  02/02 (+)guaiac stool in ED.  Transfused 3 units PRBC.  EGD on 02/04 no concerns. Continued on po protonix.  colonoscopy 02/07 (had been delayed d/t weekend and to dialysis schedule) (+)BRB, diverticular disease, no active bleed noted. GI suspect she had diverticular bleed. 09/01/22 Hgb 7.0, receiving another unit PRBC at HD.  Repeat HD morning, 02/09, CBC shows improved Hgb. Spoke w/ daughter and patient, all questions answered, confirmed stopping Eliquis - pt was set to discharge but then unable to stand up to get dressed, d/c cancelled and PT/OT to see if able to qualify for HH/SNF 02/10 PT recs for SNF, TOC consulted. 02/11 (Sunday) pending placement. 02/13: Cynthia Dean pending  02/14: Patient seen and examined in dialysis.  Appears stable and in no distress.  Was said to have had a bloody bowel movement during dialysis. Hemoglobin morning was 8 2/15 patient seen and examined this morning.  No overnight events.  Hemoglobin 7.6 trended down from 8.9.  No more bloody bowel movement.  ASSESSMENT & PLAN:   Principal Problem:   Acute on chronic blood loss anemia Active Problems:   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-stage renal disease) (HCC)   History  of lower GI bleeding   Chronic anticoagulation   History of pulmonary embolism 03/20/2022   ESRD on hemodialysis (HCC)   Chronic diastolic CHF (congestive heart failure) (HCC)   CAD (coronary artery disease)   Essential hypertension   COPD (chronic obstructive pulmonary disease) (HCC)   Chronic respiratory failure with hypoxia (HCC)   Depression   Seizure disorder (HCC)   History of CVA (cerebrovascular accident)   Insulin-requiring or dependent type II diabetes mellitus ([place])   Breast cancer, left S/P partial mastectomy,03/04/2022    Cough, sinus congestion - improved CXR on personal review appears more pulmonary vascular congestion but ddx would include PNA vs viral respiratory illness +/- COPD - less likely given improvement in symptoms this morning, neg Flu/COVID/viral panel  Albuterol prn Improved with dialysis   Cognitive impairment / dementia Limits decision making capacity Stable   Acute on chronic blood loss anemia Anemia of chronic kidney disease Chronic anticoagulation with Eliquis - discontinued in shared decision making w/ her daughter  History of rectal bleeding 01/2019 with unremarkable EGD and colonoscopy baseline hemoglobin around 8.0 s/p total 4u pRBC this admission EGD, unremarkable Colonoscopy, diverticular disease, BRB, likely diverticular bleed  Continues to have bloody stools Discontinuing Eliquis (taking for PE since August 2023) Will discontinue aspirin Check serial H&H and will consider surgical consult if patient continues to require blood transfusion   Chronic anticoagulation History of PE 03/20/2022 S/p partial mastectomy for left breast cancer 03/04/2022 Discontinued Eliquis and aspirin in the setting of GI bleeding and adeuqate treatment for primary VTE  Chronic diastolic CHF (congestive heart failure) (HCC) Clinically euvolemic cont home torsemide   ESRD on hemodialysis [facility]) Nephrology following Dialysis days are M/W/F   CAD (coronary  artery disease) Continue  atorvastatin   Essential hypertension Continue metoprolol and torsemide cont home losartan nightly   COPD (chronic obstructive pulmonary disease) ([place]) Chronic respiratory failure on 4L O2 Not currently exacerbated Continue home inhalers and baseline oxygen   Depression Continue sertraline and trazodone   Seizure disorder ([place]) Continue Keppra   Breast cancer, left S/P partial mastectomy 03/04/2022 Her oncology team reached out for follow up but family declined. Patient's lumpectomy was complicated by abscess and staph bacteremia Patient says she does not think she wants to pursue any further treatment   Insulin-requiring or dependent type II diabetes mellitus ([place]) recent A1c 7.0 SSI achs   History of CVA (cerebrovascular accident) Continue atorvastatin Aspirin on hold due to GI bleed           Physical Exam: Vitals:   09/07/22 1245 09/07/22 2048 09/08/22 0448 09/08/22 0847  BP: (!) 170/64 121/71 (!) 155/69 (!) 146/62  Pulse: 72 92 77 73  Resp: (!) 21 19 18 18  $ Temp: 97.8 F (36.6 C) 99.9 F (37.7 C) 99.9 F (37.7 C) 98.1 F (36.7 C)  TempSrc: Oral Oral Oral Oral  SpO2: 97% 94% 99% 100%  Weight: 103.6 kg     Height:       Physical Exam Vitals and nursing note reviewed.  Constitutional:      Appearance: Normal appearance.  HENT:     Nose: Nose normal.     Mouth/Throat:     Mouth: Mucous membranes are moist.  Eyes:     Comments: Pale conjunctiva  Cardiovascular:     Rate and Rhythm: Normal rate and regular rhythm.  Pulmonary:     Effort: Pulmonary effort is normal.     Breath sounds: Normal breath sounds.  Abdominal:     General: Bowel sounds are normal.     Palpations: Abdomen is soft.     Comments: Central adiposity  Musculoskeletal:        General: Normal range of motion.     Cervical back: Normal range of motion and neck supple.  Skin:    General: Skin is warm and dry.  Neurological:     Mental Status: She is  alert.     Motor: Weakness present.  Psychiatric:        Mood and Affect: Mood normal.        Behavior: Behavior normal.      Data Reviewed: Labs reviewed.  Hemoglobin 8.0, white count 11.8, platelet count 235, creatinine 8.58, BUN 65 There are no new results to review at this time.  Family Communication: Called and spoke to patient's daughter Cynthia Dean over the phone.  Explained to her in detail to him what his discharge plans will be held for today due to continued bloody stools.  Will check H&H and transfuse as needed  Disposition: Status is: Inpatient Remains inpatient appropriate because: She continues to have bloody stools requiring blood transfusion  Planned Discharge Destination: Skilled nursing facility    Time spent: 35 minutes  Author: Oran Rein, MD 09/08/2022 2:11 PM  For on call review [email or web address].

## 2022-09-08 NOTE — Progress Notes (Signed)
[facility]  ROUNDING NOTE   Subjective:   Cynthia Dean s is a 72 y.o. 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 Patient presents to ED from dialysis with abnormal labs, hgb 5.7. She has been admitted under observation for Chronic blood loss anemia [D50.0] Gastrointestinal hemorrhage, unspecified gastrointestinal hemorrhage type [K92.2] Anemia, unspecified type [D64.9] Hemoglobin drop [R71.0]  Patient is known to our practice and receives outpatient dialysis treatments at [facility] on a MWF schedule, supervised by Dr. Holley Raring.    Patient seen sitting up in bed Eating breakfast Denies pain or discomfort Room air  Objective:  Vital signs in last 24 hours:  Temp:  [97.8 F (36.6 C)-99.9 F (37.7 C)] 98.1 F (36.7 C) (02/15 0847) Pulse Rate:  [70-92] 73 (02/15 0847) Resp:  [17-21] 18 (02/15 0847) BP: (121-185)/(62-75) 146/62 (02/15 0847) SpO2:  [94 %-100 %] 100 % (02/15 0847) Weight:  [103.6 kg] 103.6 kg (02/14 1245)  Weight change:  Filed Weights   09/05/22 1250 09/07/22 0821 09/07/22 1245  Weight: 103.7 kg 104.2 kg 103.6 kg    Intake/Output: I/O last 3 completed shifts: In: 477 [P.O.:477] Out: 1000 [Other:1000]   Intake/Output this shift:  No intake/output data recorded.  Physical Exam: General: NAD  Head: Normocephalic, atraumatic. Moist oral mucosal membranes  Eyes: Anicteric  Lungs:  Clear to auscultation, normal effort  Heart: Regular rate and rhythm  Abdomen:  Soft, nontender  Extremities:  No peripheral edema.  Neurologic: Alert and oriented, moving all four extremities  Skin: No lesions  Access: Lt AVG    Basic Metabolic Panel: Recent Labs  Lab 09/02/22 0529 09/05/22 0847 09/07/22 0853  NA 133* 134* 135  K 4.3 5.0 4.9  CL 98 96* 96*  CO2 27 26 26  $ GLUCOSE 151* 129* 135*  BUN 44* 72*  65*  CREATININE 7.43* 9.24* 8.58*  CALCIUM 8.0* 8.6* 9.1  MG 1.7  --   --   PHOS  --  6.2*  --      Liver Function Tests: Recent Labs  Lab 09/05/22 0847  ALBUMIN 2.5*    No results for input(s): "LIPASE", "AMYLASE" in the last 168 hours. No results for input(s): "AMMONIA" in the last 168 hours.  CBC: Recent Labs  Lab 09/02/22 0529 09/04/22 0436 09/05/22 0847 09/05/22 2214 09/07/22 0504 09/07/22 1458 09/08/22 0219  WBC 9.3 10.2 9.1  --  11.8*  --   --   HGB 7.9* 7.5* 6.8* 8.2* 8.0* 8.9* 7.6*  HCT 24.6* 24.1* 21.6* 25.5* 25.4* 28.0* 24.2*  MCV 84.5 85.5 84.4  --  84.1  --   --   PLT 165 183 206  --  235  --   --      Cardiac Enzymes: No results for input(s): "CKTOTAL", "CKMB", "CKMBINDEX", "TROPONINI" in the last 168 hours.  BNP: Invalid input(s): "POCBNP"  CBG: Recent Labs  Lab 09/06/22 2120 09/07/22 1345 09/07/22 1716 09/07/22 2107 09/08/22 0848  GLUCAP 128* 116* 128* 71* 140*     Microbiology: Results for orders placed or performed during the hospital encounter of 08/26/22  MRSA Next Gen by PCR, Nasal     Status: None   Collection Time: 08/28/22  4:20 PM   Specimen: Nasal Mucosa; Nasal Swab  Result Value Ref Range Status   MRSA by PCR Next Gen NOT DETECTED NOT DETECTED Final    Comment: (NOTE) The GeneXpert MRSA  Assay (FDA approved for NASAL specimens only), is one component of a comprehensive MRSA colonization surveillance program. It is not intended to diagnose MRSA infection nor to guide or monitor treatment for MRSA infections. Test performance is not FDA approved in patients less than 72 years old. Performed at [facility], [address] [place], [place] [address]   Gastrointestinal Panel by PCR , Stool     Status: None   Collection Time: 08/29/22  3:44 PM   Specimen: Stool  Result Value Ref Range Status   Campylobacter species NOT DETECTED NOT DETECTED Final   Plesimonas shigelloides NOT DETECTED NOT DETECTED Final    Salmonella species NOT DETECTED NOT DETECTED Final   Yersinia enterocolitica NOT DETECTED NOT DETECTED Final   Vibrio species NOT DETECTED NOT DETECTED Final   Vibrio cholerae NOT DETECTED NOT DETECTED Final   Enteroaggregative E coli (EAEC) NOT DETECTED NOT DETECTED Final   Enteropathogenic E coli (EPEC) NOT DETECTED NOT DETECTED Final   Enterotoxigenic E coli (ETEC) NOT DETECTED NOT DETECTED Final   Shiga like toxin producing E coli (STEC) NOT DETECTED NOT DETECTED Final   Shigella/Enteroinvasive E coli (EIEC) NOT DETECTED NOT DETECTED Final   Cryptosporidium NOT DETECTED NOT DETECTED Final   Cyclospora cayetanensis NOT DETECTED NOT DETECTED Final   Entamoeba histolytica NOT DETECTED NOT DETECTED Final   Giardia lamblia NOT DETECTED NOT DETECTED Final   Adenovirus F40/41 NOT DETECTED NOT DETECTED Final   Astrovirus NOT DETECTED NOT DETECTED Final   Norovirus GI/GII NOT DETECTED NOT DETECTED Final   Rotavirus A NOT DETECTED NOT DETECTED Final   Sapovirus (I, II, IV, and V) NOT DETECTED NOT DETECTED Final    Comment: Performed at [facility], [address] [place],  [address]  Resp panel by RT-PCR (RSV, Flu A&B, Covid) Anterior Nasal Swab     Status: None   Collection Time: 09/05/22  1:52 PM   Specimen: Anterior Nasal Swab  Result Value Ref Range Status   SARS Coronavirus 2 by RT PCR NEGATIVE NEGATIVE Final    Comment: (NOTE) SARS-CoV-2 target nucleic acids are NOT DETECTED.  The SARS-CoV-2 RNA is generally detectable in upper respiratory specimens during the acute phase of infection. The lowest concentration of SARS-CoV-2 viral copies this assay can detect is 138 copies/mL. A negative result does not preclude SARS-Cov-2 infection and should not be used as the sole basis for treatment or other patient management decisions. A negative result may occur with  improper specimen collection/handling, submission of specimen other than nasopharyngeal swab, presence of  viral mutation(s) within the areas targeted by this assay, and inadequate number of viral copies(<138 copies/mL). A negative result must be combined with clinical observations, patient history, and epidemiological information. The expected result is Negative.  Fact Sheet for Patients:  EntrepreneurPulse.com.au  Fact Sheet for Healthcare Providers:  IncredibleEmployment.be  This test is no t yet approved or cleared by the [place] FDA and  has been authorized for detection and/or diagnosis of SARS-CoV-2 by FDA under an Emergency Use Authorization (EUA). This EUA will remain  in effect (meaning this test can be used) for the duration of the COVID-19 declaration under Section 564(b)(1) of the Act, 21 U.S.C.section 360bbb-3(b)(1), unless the authorization is terminated  or revoked sooner.       Influenza A by PCR NEGATIVE NEGATIVE Final   Influenza B by PCR NEGATIVE NEGATIVE Final    Comment: (NOTE) The Xpert Xpress SARS-CoV-2/FLU/RSV plus assay is intended as an aid in  the diagnosis of influenza from Nasopharyngeal swab specimens and should not be used as a sole basis for treatment. Nasal washings and aspirates are unacceptable for Xpert Xpress SARS-CoV-2/FLU/RSV testing.  Fact Sheet for Patients: EntrepreneurPulse.com.au  Fact Sheet for Healthcare Providers: IncredibleEmployment.be  This test is not yet approved or cleared by the [place] FDA and has been authorized for detection and/or diagnosis of SARS-CoV-2 by FDA under an Emergency Use Authorization (EUA). This EUA will remain in effect (meaning this test can be used) for the duration of the COVID-19 declaration under Section 564(b)(1) of the Act, 21 U.S.C. section 360bbb-3(b)(1), unless the authorization is terminated or revoked.     Resp Syncytial Virus by PCR NEGATIVE NEGATIVE Final    Comment: (NOTE) Fact Sheet for  Patients: EntrepreneurPulse.com.au  Fact Sheet for Healthcare Providers: IncredibleEmployment.be  This test is not yet approved or cleared by the [place] FDA and has been authorized for detection and/or diagnosis of SARS-CoV-2 by FDA under an Emergency Use Authorization (EUA). This EUA will remain in effect (meaning this test can be used) for the duration of the COVID-19 declaration under Section 564(b)(1) of the Act, 21 U.S.C. section 360bbb-3(b)(1), unless the authorization is terminated or revoked.  Performed at [facility], [place], [place] [address]   Respiratory (~20 pathogens) panel by PCR     Status: None   Collection Time: 09/05/22  2:50 PM  Result Value Ref Range Status   Adenovirus NOT DETECTED NOT DETECTED Final   Coronavirus 229E NOT DETECTED NOT DETECTED Final    Comment: (NOTE) The Coronavirus on the Respiratory Panel, DOES NOT test for the novel  Coronavirus (2019 nCoV)    Coronavirus HKU1 NOT DETECTED NOT DETECTED Final   Coronavirus NL63 NOT DETECTED NOT DETECTED Final   Coronavirus OC43 NOT DETECTED NOT DETECTED Final   Metapneumovirus NOT DETECTED NOT DETECTED Final   Rhinovirus / Enterovirus NOT DETECTED NOT DETECTED Final   Influenza A NOT DETECTED NOT DETECTED Final   Influenza B NOT DETECTED NOT DETECTED Final   Parainfluenza Virus 1 NOT DETECTED NOT DETECTED Final   Parainfluenza Virus 2 NOT DETECTED NOT DETECTED Final   Parainfluenza Virus 3 NOT DETECTED NOT DETECTED Final   Parainfluenza Virus 4 NOT DETECTED NOT DETECTED Final   Respiratory Syncytial Virus NOT DETECTED NOT DETECTED Final   Bordetella pertussis NOT DETECTED NOT DETECTED Final   Bordetella Parapertussis NOT DETECTED NOT DETECTED Final   Chlamydophila pneumoniae NOT DETECTED NOT DETECTED Final   Mycoplasma pneumoniae NOT DETECTED NOT DETECTED Final    Comment: Performed at [facility], [address]  [place], [place] [address]    Coagulation Studies: No results for input(s): "LABPROT", "INR" in the last 72 hours.  Urinalysis: No results for input(s): "COLORURINE", "LABSPEC", "PHURINE", "GLUCOSEU", "HGBUR", "BILIRUBINUR", "KETONESUR", "PROTEINUR", "UROBILINOGEN", "NITRITE", "LEUKOCYTESUR" in the last 72 hours.  Invalid input(s): "APPERANCEUR"    Imaging: No results found.   Medications:    anticoagulant sodium citrate      atorvastatin  80 mg Oral QPM   Chlorhexidine Gluconate Cloth  6 each Topical Q0600   cinacalcet  30 mg Oral Q breakfast   epoetin (EPOGEN/PROCRIT) injection  10,000 Units Intravenous Q M,W,F-HD   gabapentin  100 mg Oral BID   insulin aspart  0-6 Units Subcutaneous TID WC   Ipratropium-Albuterol  1 puff Inhalation Q6H   levETIRAcetam  1,000 mg Oral Daily   levETIRAcetam  250 mg Oral Once per day on  Mon Wed Fri   losartan  100 mg Oral QHS   metoprolol succinate  100 mg Oral Daily   pantoprazole  40 mg Oral Daily   sertraline  25 mg Oral Daily   torsemide  100 mg Oral Daily   traZODone  50 mg Oral QHS   acetaminophen **OR** acetaminophen, albuterol, alteplase, anticoagulant sodium citrate, heparin, HYDROcodone-acetaminophen, lidocaine (PF), lidocaine-prilocaine, pentafluoroprop-tetrafluoroeth  Assessment/ Plan:  [name] is a 72 y.o.  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 Patient presents to ED from dialysis with abnormal labs, hgb 5.7. She has been admitted under observation for Chronic blood loss anemia [D50.0] Gastrointestinal hemorrhage, unspecified gastrointestinal hemorrhage type [K92.2] Anemia, unspecified type [D64.9] Hemoglobin drop [R71.0]  [facility]/MWF/Lt AVG   1. End stage renal disease on hemodialysis. Dialysis received yesterday, 1L UF achieved. Next treatment scheduled for  Friday.   2. Anemia of chronic kidney disease with acute blood loss Lab Results  Component Value Date   HGB 7.6 (L) 09/08/2022   Hgb on admission 5.7 with positive guaiac. Patient has received 3 unit blood transfusion during this admission. GI consulted and EGD on 08/28/22, no signs of active bleeding. Colonoscopy on 08/31/22 negative for active bleeding, blood clots and pooling noted in colon, believed to be diverticular in nature.  Hgb decreased to 7.6, continue Epo with treatment.   3. Hypertension with chronic kidney disease. Home regimen includes losartan, metoprolol, and torsemide. Currently receiving these medications.  Blood pressure 146/62, acceptable for this patient.  4. Secondary Hyperparathyroidism: with outpatient labs: PTH 613, phosphorus 4.8, calcium 9.0 on 03/07/22.    :  Lab Results  Component Value Date   PTH 131 (H) 03/14/2018   CALCIUM 9.1 09/07/2022   CAION 1.09 (L) 08/28/2022   PHOS 6.2 (H) 09/05/2022    Will monitor bone minerals during this admission for now. Continue daily cinacalcet. Will consider binders outpatient.    LOS: [place] [place] 2/15/202410:35 AM

## 2022-09-09 LAB — GLUCOSE, CAPILLARY
Glucose-Capillary: 144 mg/dL — ABNORMAL HIGH (ref 70–99)
Glucose-Capillary: 116 mg/dL — ABNORMAL HIGH (ref 70–99)

## 2022-09-09 LAB — CBC
HCT: 24.7 % — ABNORMAL LOW (ref 36.0–46.0)
HCT: 27.7 % — ABNORMAL LOW (ref 36.0–46.0)
Hemoglobin: 7.8 g/dL — ABNORMAL LOW (ref 12.0–15.0)
Hemoglobin: 8.7 g/dL — ABNORMAL LOW (ref 12.0–15.0)
MCH: 26.6 pg (ref 26.0–34.0)
MCH: 26.7 pg (ref 26.0–34.0)
MCHC: 31.6 g/dL (ref 30.0–36.0)
MCHC: 31.4 g/dL (ref 30.0–36.0)
MCV: 84.3 fL (ref 80.0–100.0)
MCV: 85 fL (ref 80.0–100.0)
Platelets: 268 10*3/uL (ref 150–400)
Platelets: 286 10*3/uL (ref 150–400)
RBC: 2.93 MIL/uL — ABNORMAL LOW (ref 3.87–5.11)
RBC: 3.26 MIL/uL — ABNORMAL LOW (ref 3.87–5.11)
RDW: 15.8 % — ABNORMAL HIGH (ref 11.5–15.5)
RDW: 15.7 % — ABNORMAL HIGH (ref 11.5–15.5)
WBC: 11.9 10*3/uL — ABNORMAL HIGH (ref 4.0–10.5)
WBC: 10.8 10*3/uL — ABNORMAL HIGH (ref 4.0–10.5)
nRBC: 0 % (ref 0.0–0.2)
nRBC: 0 % (ref 0.0–0.2)

## 2022-09-09 LAB — RENAL FUNCTION PANEL
Albumin: 2.5 g/dL — ABNORMAL LOW (ref 3.5–5.0)
Anion gap: 9 (ref 5–15)
BUN: 50 mg/dL — ABNORMAL HIGH (ref 8–23)
CO2: 27 mmol/L (ref 22–32)
Calcium: 8.9 mg/dL (ref 8.9–10.3)
Chloride: 97 mmol/L — ABNORMAL LOW (ref 98–111)
Creatinine, Ser: 8.44 mg/dL — ABNORMAL HIGH (ref 0.44–1.00)
GFR, Estimated: 5 mL/min — ABNORMAL LOW (ref >60)
Glucose, Bld: 144 mg/dL — ABNORMAL HIGH (ref 70–99)
Phosphorus: 5.4 mg/dL — ABNORMAL HIGH (ref 2.5–4.6)
Potassium: 4.2 mmol/L (ref 3.5–5.1)
Sodium: 133 mmol/L — ABNORMAL LOW (ref 135–145)

## 2022-09-09 LAB — HEMOGLOBIN AND HEMATOCRIT, BLOOD
HCT: 25.6 % — ABNORMAL LOW (ref 36.0–46.0)
Hemoglobin: 8.1 g/dL — ABNORMAL LOW (ref 12.0–15.0)

## 2022-09-09 MED ORDER — EPOETIN ALFA 10000 UNIT/ML IJ SOLN
INTRAMUSCULAR | Status: AC
  Filled 2022-09-09: qty 1

## 2022-09-09 NOTE — Progress Notes (Signed)
[facility]  ROUNDING NOTE   Subjective:   Cynthia Dean s is a 72 y.o. 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 Patient presents to ED from dialysis with abnormal labs, hgb 5.7. She has been admitted under observation for Chronic blood loss anemia [D50.0] Gastrointestinal hemorrhage, unspecified gastrointestinal hemorrhage type [K92.2] Anemia, unspecified type [D64.9] Hemoglobin drop [R71.0]  Patient is known to our practice and receives outpatient dialysis treatments at [facility] on a MWF schedule, supervised by Dr. Holley Raring.    Patient seen and evaluated during dialysis   HEMODIALYSIS FLOWSHEET:  Blood Flow Rate (mL/min): 400 mL/min Arterial Pressure (mmHg): -200 mmHg Venous Pressure (mmHg): 200 mmHg TMP (mmHg): 1 mmHg Ultrafiltration Rate (mL/min): 436 mL/min Dialysate Flow Rate (mL/min): 300 ml/min Dialysis Fluid Bolus: Normal Saline Bolus Amount (mL): 300 mL  No complaints to offer at this time States she feels well is ready for discharge.  Objective:  Vital signs in last 24 hours:  Temp:  [97.8 F (36.6 C)-99.7 F (37.6 C)] 97.8 F (36.6 C) (02/16 0853) Pulse Rate:  [64-76] 65 (02/16 1230) Resp:  [15-20] 19 (02/16 1230) BP: (127-165)/(49-73) 165/71 (02/16 1230) SpO2:  [94 %-100 %] 98 % (02/16 1230) Weight:  [102.1 kg] 102.1 kg (02/16 0853)  Weight change:  Filed Weights   09/07/22 0821 09/07/22 1245 09/09/22 0853  Weight: 104.2 kg 103.6 kg 102.1 kg    Intake/Output: I/O last 3 completed shifts: In: 240 [P.O.:240] Out: -    Intake/Output this shift:  No intake/output data recorded.  Physical Exam: General: NAD  Head: Normocephalic, atraumatic. Moist oral mucosal membranes  Eyes: Anicteric  Lungs:  Clear to auscultation, normal effort  Heart: Regular rate and rhythm  Abdomen:  Soft, nontender   Extremities:  No peripheral edema.  Neurologic: Alert and oriented, moving all four extremities  Skin: No lesions  Access: Lt AVG    Basic Metabolic Panel: Recent Labs  Lab 09/05/22 0847 09/07/22 0853 09/09/22 0720  NA 134* 135 133*  K 5.0 4.9 4.2  CL 96* 96* 97*  CO2 26 26 27  $ GLUCOSE 129* 135* 144*  BUN 72* 65* 50*  CREATININE 9.24* 8.58* 8.44*  CALCIUM 8.6* 9.1 8.9  PHOS 6.2*  --  5.4*     Liver Function Tests: Recent Labs  Lab 09/05/22 0847 09/09/22 0720  ALBUMIN 2.5* 2.5*    No results for input(s): "LIPASE", "AMYLASE" in the last 168 hours. No results for input(s): "AMMONIA" in the last 168 hours.  CBC: Recent Labs  Lab 09/04/22 0436 09/05/22 0847 09/05/22 2214 09/07/22 0504 09/07/22 1458 09/08/22 0219 09/08/22 1646 09/09/22 0150 09/09/22 0434  WBC 10.2 9.1  --  11.8*  --   --   --   --  11.9*  HGB 7.5* 6.8*   < > 8.0* 8.9* 7.6* 8.0* 8.1* 7.8*  HCT 24.1* 21.6*   < > 25.4* 28.0* 24.2* 25.8* 25.6* 24.7*  MCV 85.5 84.4  --  84.1  --   --   --   --  84.3  PLT 183 206  --  235  --   --   --   --  268   < > = values in this interval not displayed.     Cardiac Enzymes: No results for input(s): "CKTOTAL", "CKMB", "CKMBINDEX", "TROPONINI" in the last 168 hours.  BNP: Invalid input(s): "POCBNP"  CBG: Recent Labs  Lab 09/08/22 0848 09/08/22 1200 09/08/22 1611 09/08/22 2031 09/09/22 0750  GLUCAP 140* 170* 151* 152* 144*     Microbiology: Results for orders placed or performed during the hospital encounter of 08/26/22  MRSA Next Gen by PCR, Nasal     Status: None   Collection Time: 08/28/22  4:20 PM   Specimen: Nasal Mucosa; Nasal Swab  Result Value Ref Range Status   MRSA by PCR Next Gen NOT DETECTED NOT DETECTED Final    Comment: (NOTE) The GeneXpert MRSA Assay (FDA approved for NASAL specimens only), is one component of a comprehensive MRSA colonization surveillance program. It is not intended to diagnose MRSA infection nor to  guide or monitor treatment for MRSA infections. Test performance is not FDA approved in patients less than 96 years old. Performed at [facility], [address] [place], [place] [address]   Gastrointestinal Panel by PCR , Stool     Status: None   Collection Time: 08/29/22  3:44 PM   Specimen: Stool  Result Value Ref Range Status   Campylobacter species NOT DETECTED NOT DETECTED Final   Plesimonas shigelloides NOT DETECTED NOT DETECTED Final   Salmonella species NOT DETECTED NOT DETECTED Final   Yersinia enterocolitica NOT DETECTED NOT DETECTED Final   Vibrio species NOT DETECTED NOT DETECTED Final   Vibrio cholerae NOT DETECTED NOT DETECTED Final   Enteroaggregative E coli (EAEC) NOT DETECTED NOT DETECTED Final   Enteropathogenic E coli (EPEC) NOT DETECTED NOT DETECTED Final   Enterotoxigenic E coli (ETEC) NOT DETECTED NOT DETECTED Final   Shiga like toxin producing E coli (STEC) NOT DETECTED NOT DETECTED Final   Shigella/Enteroinvasive E coli (EIEC) NOT DETECTED NOT DETECTED Final   Cryptosporidium NOT DETECTED NOT DETECTED Final   Cyclospora cayetanensis NOT DETECTED NOT DETECTED Final   Entamoeba histolytica NOT DETECTED NOT DETECTED Final   Giardia lamblia NOT DETECTED NOT DETECTED Final   Adenovirus F40/41 NOT DETECTED NOT DETECTED Final   Astrovirus NOT DETECTED NOT DETECTED Final   Norovirus GI/GII NOT DETECTED NOT DETECTED Final   Rotavirus A NOT DETECTED NOT DETECTED Final   Sapovirus (I, II, IV, and V) NOT DETECTED NOT DETECTED Final    Comment: Performed at [facility], [address] [place], [place] [address]  Resp panel by RT-PCR (RSV, Flu A&B, Covid) Anterior Nasal Swab     Status: None   Collection Time: 09/05/22  1:52 PM   Specimen: Anterior Nasal Swab  Result Value Ref Range Status   SARS Coronavirus 2 by RT PCR NEGATIVE NEGATIVE Final    Comment: (NOTE) SARS-CoV-2 target nucleic acids are NOT DETECTED.  The SARS-CoV-2 RNA is generally  detectable in upper respiratory specimens during the acute phase of infection. The lowest concentration of SARS-CoV-2 viral copies this assay can detect is 138 copies/mL. A negative result does not preclude SARS-Cov-2 infection and should not be used as the sole basis for treatment or other patient management decisions. A negative result may occur with  improper specimen collection/handling, submission of specimen other than nasopharyngeal swab, presence of viral mutation(s) within the areas targeted by this assay, and inadequate number of viral copies(<138 copies/mL). A negative result must be combined with clinical observations, patient history, and epidemiological information. The expected result is Negative.  Fact Sheet for Patients:  EntrepreneurPulse.com.au  Fact Sheet for Healthcare Providers:  IncredibleEmployment.be  This test is no t yet approved or cleared by the [place] FDA and  has been authorized for detection  and/or diagnosis of SARS-CoV-2 by FDA under an Emergency Use Authorization (EUA). This EUA will remain  in effect (meaning this test can be used) for the duration of the COVID-19 declaration under Section 564(b)(1) of the Act, 21 U.S.C.section 360bbb-3(b)(1), unless the authorization is terminated  or revoked sooner.       Influenza A by PCR NEGATIVE NEGATIVE Final   Influenza B by PCR NEGATIVE NEGATIVE Final    Comment: (NOTE) The Xpert Xpress SARS-CoV-2/FLU/RSV plus assay is intended as an aid in the diagnosis of influenza from Nasopharyngeal swab specimens and should not be used as a sole basis for treatment. Nasal washings and aspirates are unacceptable for Xpert Xpress SARS-CoV-2/FLU/RSV testing.  Fact Sheet for Patients: EntrepreneurPulse.com.au  Fact Sheet for Healthcare Providers: IncredibleEmployment.be  This test is not yet approved or cleared by the [place] FDA  and has been authorized for detection and/or diagnosis of SARS-CoV-2 by FDA under an Emergency Use Authorization (EUA). This EUA will remain in effect (meaning this test can be used) for the duration of the COVID-19 declaration under Section 564(b)(1) of the Act, 21 U.S.C. section 360bbb-3(b)(1), unless the authorization is terminated or revoked.     Resp Syncytial Virus by PCR NEGATIVE NEGATIVE Final    Comment: (NOTE) Fact Sheet for Patients: EntrepreneurPulse.com.au  Fact Sheet for Healthcare Providers: IncredibleEmployment.be  This test is not yet approved or cleared by the [place] FDA and has been authorized for detection and/or diagnosis of SARS-CoV-2 by FDA under an Emergency Use Authorization (EUA). This EUA will remain in effect (meaning this test can be used) for the duration of the COVID-19 declaration under Section 564(b)(1) of the Act, 21 U.S.C. section 360bbb-3(b)(1), unless the authorization is terminated or revoked.  Performed at [facility], [place], [place] [address]   Respiratory (~20 pathogens) panel by PCR     Status: None   Collection Time: 09/05/22  2:50 PM  Result Value Ref Range Status   Adenovirus NOT DETECTED NOT DETECTED Final   Coronavirus 229E NOT DETECTED NOT DETECTED Final    Comment: (NOTE) The Coronavirus on the Respiratory Panel, DOES NOT test for the novel  Coronavirus (2019 nCoV)    Coronavirus HKU1 NOT DETECTED NOT DETECTED Final   Coronavirus NL63 NOT DETECTED NOT DETECTED Final   Coronavirus OC43 NOT DETECTED NOT DETECTED Final   Metapneumovirus NOT DETECTED NOT DETECTED Final   Rhinovirus / Enterovirus NOT DETECTED NOT DETECTED Final   Influenza A NOT DETECTED NOT DETECTED Final   Influenza B NOT DETECTED NOT DETECTED Final   Parainfluenza Virus 1 NOT DETECTED NOT DETECTED Final   Parainfluenza Virus 2 NOT DETECTED NOT DETECTED Final   Parainfluenza Virus 3 NOT  DETECTED NOT DETECTED Final   Parainfluenza Virus 4 NOT DETECTED NOT DETECTED Final   Respiratory Syncytial Virus NOT DETECTED NOT DETECTED Final   Bordetella pertussis NOT DETECTED NOT DETECTED Final   Bordetella Parapertussis NOT DETECTED NOT DETECTED Final   Chlamydophila pneumoniae NOT DETECTED NOT DETECTED Final   Mycoplasma pneumoniae NOT DETECTED NOT DETECTED Final    Comment: Performed at [facility], [address] [place], [place] [address]    Coagulation Studies: No results for input(s): "LABPROT", "INR" in the last 72 hours.  Urinalysis: No results for input(s): "COLORURINE", "LABSPEC", "PHURINE", "GLUCOSEU", "HGBUR", "BILIRUBINUR", "KETONESUR", "PROTEINUR", "UROBILINOGEN", "NITRITE", "LEUKOCYTESUR" in the last 72 hours.  Invalid input(s): "APPERANCEUR"    Imaging: No results found.   Medications:  anticoagulant sodium citrate      atorvastatin  80 mg Oral QPM   Chlorhexidine Gluconate Cloth  6 each Topical Q0600   cinacalcet  30 mg Oral Q breakfast   epoetin alfa       epoetin (EPOGEN/PROCRIT) injection  10,000 Units Intravenous Q M,W,F-HD   gabapentin  100 mg Oral BID   insulin aspart  0-6 Units Subcutaneous TID WC   Ipratropium-Albuterol  1 puff Inhalation Q6H   levETIRAcetam  1,000 mg Oral Daily   levETIRAcetam  250 mg Oral Once per day on Mon Wed Fri   losartan  100 mg Oral QHS   metoprolol succinate  100 mg Oral Daily   pantoprazole  40 mg Oral Daily   sertraline  25 mg Oral Daily   torsemide  100 mg Oral Daily   traZODone  50 mg Oral QHS   acetaminophen **OR** acetaminophen, albuterol, alteplase, anticoagulant sodium citrate, epoetin alfa, heparin, HYDROcodone-acetaminophen, lidocaine (PF), lidocaine-prilocaine, pentafluoroprop-tetrafluoroeth  Assessment/ Plan:  [name] is a 72 y.o.  female with past medical conditions including CAD, COPD with intermittent home O2, hypertension, hyperlipidemia, stroke, diabetes and end  stage renal disease on hemodialysis. Patient presents to ED from dialysis with abnormal labs, hgb 5.7. She has been admitted under observation for Chronic blood loss anemia [D50.0] Gastrointestinal hemorrhage, unspecified gastrointestinal hemorrhage type [K92.2] Anemia, unspecified type [D64.9] Hemoglobin drop [R71.0]  [facility]/MWF/Lt AVG   1. End stage renal disease on hemodialysis. Receiving dialysis today, UF 0.5-1L as tolerated, Next treatment scheduled for Monday.   2. Anemia of chronic kidney disease with acute blood loss Lab Results  Component Value Date   HGB 7.8 (L) 09/09/2022   Hgb on admission 5.7 with positive guaiac. Patient has received 3 unit blood transfusion during this admission. GI consulted and EGD on 08/28/22, no signs of active bleeding. Colonoscopy on 08/31/22 negative for active bleeding, blood clots and pooling noted in colon, believed to be diverticular in nature.  Hgb slightly improved today, 7.8. Continue EPO with dialysis.   3. Hypertension with chronic kidney disease. Home regimen includes losartan, metoprolol, and torsemide. Currently receiving these medications.  Blood pressure 165/71 during dialysis   4. Secondary Hyperparathyroidism: with outpatient labs: PTH 613, phosphorus 4.8, calcium 9.0 on 03/07/22.    :  Lab Results  Component Value Date   PTH 131 (H) 03/14/2018   CALCIUM 8.9 09/09/2022   CAION 1.09 (L) 08/28/2022   PHOS 5.4 (H) 09/09/2022    Calcium within desired range, phosphorus mildly elevated but improving. Continue daily cinacalcet.     LOS: [place] 2/16/202412:37 PM

## 2022-09-09 NOTE — Care Management Important Message (Signed)
Important Message  Patient Details  Name: Cynthia Dean MRN: [id number] Date of Birth: 09-15-1950   Medicare Important Message Given:  Yes     Juliann Pulse A Noemie Devivo 09/09/2022, 11:23 AM

## 2022-09-09 NOTE — Discharge Summary (Addendum)
Physician Discharge Summary   [name] MRN: [id number] DOB: [birth year]  Admit date:     08/26/2022  Discharge date: 09/09/22  Discharge Physician: Oran Rein   PCP: Lowella Bandy, MD   Recommendations at discharge:    Nephrology for dialysis GI in 2-4 weeks PCP in 2 weeks  Discharge Diagnoses: Principal Problem:   Acute on chronic blood loss anemia Active Problems:   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-stage renal disease) ([place])   History of lower GI bleeding   Chronic anticoagulation   History of pulmonary embolism 03/20/2022   ESRD on hemodialysis (HCC)   Chronic diastolic CHF (congestive heart failure) (HCC)   CAD (coronary artery disease)   Essential hypertension   COPD (chronic obstructive pulmonary disease) (HCC)   Chronic respiratory failure with hypoxia (HCC)   Depression   Seizure disorder ([place])   History of CVA (cerebrovascular accident)   Insulin-requiring or dependent type II diabetes mellitus ([place])   Breast cancer, left S/P partial mastectomy,03/04/2022   Hemoglobin drop  Resolved Problems:   * No resolved hospital problems. *  Hospital Course: Cynthia Dean is a 72 y.o. female with medical history significant for ESRD on HD MWF, insulin requiring T2DM, COPD on home 4 L home O2, CAD, carotid stenosis, seizure disorder, HTN, prior CVA, GERD, GI bleeding 2020 with unremarkable EGD and colonoscopy, left breast ca s/p partial mastectomy and left axillary dissection on 0000000 complicated by left breast abscess with coag negative staph bacteremia as well as small bilateral PE 03/20/2022, currently on Eliquis, who was sent from dialysis to the ED 08/26/2022 for evaluation of hemoglobin of 5.7, down from baseline of around 7.6-8.3.  02/02 (+)guaiac stool in ED.  Transfused 3 units PRBC.  EGD on 02/04 no concerns. Continued on po protonix.  colonoscopy 02/07 (had been delayed d/t weekend and to dialysis schedule) (+)BRB, diverticular disease, no  active bleed noted. GI suspect she had diverticular bleed. 09/01/22 Hgb 7.0, receiving another unit PRBC at HD.  Repeat HD morning, 02/09, CBC shows improved Hgb. Spoke w/ daughter and patient, all questions answered, confirmed stopping Eliquis - pt was set to discharge but then unable to stand up to get dressed, d/c cancelled and PT/OT to see if able to qualify for HH/SNF 02/10 PT recs for SNF, TOC consulted. 02/11 (Sunday) pending placement. 02/13: [facility] pending  2/16 patient stayed hemodynamically stable tolerated dialysis without any complications.  No more GI bleeding.  Stable H&H.    Consultants:  Nephrology Gastroenterology   Procedures: EGD 08/28/22 Colonoscopy 08/31/22   ASSESSMENT & PLAN:   Principal Problem:   Acute on chronic blood loss anemia Active Problems:   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-stage renal disease) (HCC)   History of lower GI bleeding   Chronic anticoagulation   History of pulmonary embolism 03/20/2022   ESRD on hemodialysis (HCC)   Chronic diastolic CHF (congestive heart failure) (HCC)   CAD (coronary artery disease)   Essential hypertension   COPD (chronic obstructive pulmonary disease) (HCC)   Chronic respiratory failure with hypoxia (HCC)   Depression   Seizure disorder (HCC)   History of CVA (cerebrovascular accident)   Insulin-requiring or dependent type II diabetes mellitus (HCC)   Breast cancer, left S/P partial mastectomy,03/04/2022   Hemoglobin drop   Cough, sinus congestion - improved CXR on personal review appears more pulmonary vascular congestion but ddx would include PNA vs viral respiratory illness +/- COPD - less likely given improvement in symptoms this morning, neg Flu/COVID/viral panel  Albuterol prn  Cognitive impairment / dementia Limits decision making capacity Stable  Acute on chronic blood loss anemia Anemia of chronic kidney disease Chronic anticoagulation with Eliquis - discontinued in shared decision making w/ her daughter   History of rectal bleeding 01/2019 with unremarkable EGD and colonoscopy baseline hemoglobin around 8.0 s/p total 4u pRBC this admission EGD, unremarkable Colonoscopy, diverticular disease, BRB, likely diverticular bleed  Discontinuing Eliquis (taking for PE since August 2023) Hgb will need frequent monitoring outpatient    Chronic anticoagulation History of PE 03/20/2022 S/p partial mastectomy for left breast cancer 03/04/2022 Discontinuing Eliquis in the setting of GI bleeding and adeuqate treatment for primary VTE   Chronic diastolic CHF (congestive heart failure) (HCC) Clinically euvolemic cont home torsemide   ESRD on hemodialysis [facility]) Nephrology following Dialysis as scheduled    CAD (coronary artery disease) Continue aspirin and atorvastatin   Essential hypertension Continue metoprolol and torsemide cont home losartan nightly   COPD (chronic obstructive pulmonary disease) ([place]) Chronic respiratory failure on 4L O2 Not currently exacerbated Continue home inhalers and baseline oxygen   Depression Continue sertraline and trazodone   Seizure disorder ([place]) Continue Keppra   Breast cancer, left S/P partial mastectomy 03/04/2022 Her oncology team reached out for follow up but family declined. Patient's lumpectomy was complicated by abscess and staph bacteremia Patient says she does not think she wants to pursue any further treatment   Insulin-requiring or dependent type II diabetes mellitus ([place]) recent A1c 7.0 SSI achs   History of CVA (cerebrovascular accident) Continue aspirin and atorvastatin  DVT prophylaxis: SCD/compression hose  Pertinent IV fluids/nutrition: none Central lines / invasive devices: L chest port    Code Status: FULL CODE  Current Admission Status: inpatient  Med/Surg  TOC needs / Dispo plan: SNF Barriers to discharge / significant pending items: placement SNF auth pending       Pain control - [place] Controlled Substance  Reporting System database was reviewed. and patient was instructed, not to drive, operate heavy machinery, perform activities at heights, swimming or participation in water activities or provide baby-sitting services while on Pain, Sleep and Anxiety Medications; until their outpatient Physician has advised to do so again. Also recommended to not to take more than prescribed Pain, Sleep and Anxiety Medications.  Consultants:Gi and Nephrolgy  Procedures performed: EGD and C scope  Disposition: Nursing home Diet recommendation:  Discharge Diet Orders (From admission, onward)     Start     Ordered   09/09/22 0000  Diet - low sodium heart healthy        09/09/22 1434   09/02/22 0000  Diet - low sodium heart healthy        09/02/22 1256           Renal diet DISCHARGE MEDICATION: Allergies as of 09/09/2022       Reactions   Oxycodone Nausea And Vomiting   Oxycodone-acetaminophen Other (See Comments)   Wound Dressing Adhesive    Hydrocodone    Intolerant more than allergic   Tape Itching   Skin Dermatitis/itching (tape adhesive) Skin Dermatitis/itching (tape adhesive)   Tapentadol Itching   Skin Dermatitis/itching (tape adhesive) Skin Dermatitis/itching (tape adhesive)        Medication List     STOP taking these medications    apixaban 5 MG Tabs tablet Commonly known as: ELIQUIS   Ipratropium-Albuterol 20-100 MCG/ACT Aers respimat Commonly known as: COMBIVENT   methylPREDNISolone 4 MG tablet Commonly known as: MEDROL  TAKE these medications    acetaminophen 500 MG tablet Commonly known as: TYLENOL Take 1 tablet (500 mg total) by mouth every 6 (six) hours as needed.   aspirin EC 81 MG tablet Take 1 tablet (81 mg total) by mouth daily. Swallow whole.   atorvastatin 80 MG tablet Commonly known as: LIPITOR Take 1 tablet (80 mg total) by mouth every evening.   cinacalcet 30 MG tablet Commonly known as: SENSIPAR Take 1 tablet (30 mg total) by mouth daily  with breakfast.   diphenoxylate-atropine 2.5-0.025 MG tablet Commonly known as: LOMOTIL SMARTSIG:1 Tablet(s) By Mouth Every 12 Hours PRN   epoetin alfa 10000 UNIT/ML injection Commonly known as: EPOGEN Inject 1 mL (10,000 Units total) into the vein every Monday, Wednesday, and Friday with hemodialysis.   fluticasone 50 MCG/ACT nasal spray Commonly known as: FLONASE Place 1 spray into both nostrils daily.   gabapentin 100 MG capsule Commonly known as: NEURONTIN Take 1 capsule (100 mg total) by mouth 2 (two) times daily. Additional capsule as needed once daily for pain, total maximum daily dose 3 capsules (300 mg) What changed:  medication strength how much to take additional instructions   glucose 4 GM chewable tablet Chew 1 tablet (4 g total) by mouth as needed for low blood sugar.   levETIRAcetam 1000 MG tablet Commonly known as: KEPPRA Take 1 tablet (1,000 mg total) by mouth daily.   levETIRAcetam 250 MG tablet Commonly known as: KEPPRA Take 1 tablet (250 mg total) by mouth 3 (three) times a week.   lidocaine 5 % Commonly known as: LIDODERM Place 1 patch onto the skin daily. Remove & Discard patch within 12 hours or as directed by MD   losartan 100 MG tablet Commonly known as: COZAAR Take 1 tablet (100 mg total) by mouth at bedtime.   metoprolol succinate 100 MG 24 hr tablet Commonly known as: TOPROL-XL Take 1 tablet (100 mg total) by mouth daily.   NovoLIN 70/30 (70-30) 100 UNIT/ML injection Generic drug: insulin NPH-regular Human Inject 28 Units into the skin 2 (two) times daily with a meal.   omeprazole 20 MG capsule Commonly known as: PRILOSEC Take 1 capsule (20 mg total) by mouth 2 (two) times daily.   polyethylene glycol 17 g packet Commonly known as: MIRALAX / GLYCOLAX Take 17 g by mouth daily.   senna-docusate 8.6-50 MG tablet Commonly known as: Senokot-S Take 2 tablets by mouth at bedtime as needed for mild constipation or moderate constipation.    sertraline 25 MG tablet Commonly known as: ZOLOFT Take 1 tablet (25 mg total) by mouth daily.   torsemide 100 MG tablet Commonly known as: DEMADEX Take 1 tablet (100 mg total) by mouth daily.   traZODone 50 MG tablet Commonly known as: DESYREL Take 1 tablet (50 mg total) by mouth at bedtime.   umeclidinium-vilanterol 62.5-25 MCG/ACT Aepb Commonly known as: ANORO ELLIPTA Inhale 1 puff into the lungs daily.   vitamin D3 25 MCG (1000 UT) tablet Generic drug: Cholecalciferol Take 1 tablet (1,000 Units total) by mouth daily.        Contact information for follow-up providers     [name], Mliss Sax, MD Follow up.   Specialty: Pediatrics Contact information: [place] [address] [phone]         Theodore Demark, RN. Call.               Contact information for after-discharge care     Destination  [facility] [place] [facility] .   Service: Skilled Nursing Contact information: [address] [place] [phone]                    Discharge Exam: Danley Danker Weights   09/07/22 1245 09/09/22 0853 09/09/22 1332  Weight: 103.6 kg 102.1 kg 102.8 kg     Condition at discharge: fair  The results of significant diagnostics from this hospitalization (including imaging, microbiology, ancillary and laboratory) are listed below for reference.   Imaging Studies: DG Chest Port 1 View  Result Date: 09/06/2022 CLINICAL DATA:  Abnormal lung sounds EXAM: PORTABLE CHEST 1 VIEW COMPARISON:  Chest x-ray dated April 29, 2022 FINDINGS: Unchanged cardiomegaly. Loop recorder device. Right chest wall port. New diffuse interstitial opacities. No large pleural effusion. No evidence of pneumothorax. IMPRESSION: New diffuse reticulonodular opacities, likely due to edema or infection. Electronically Signed   By: Yetta Glassman M.D.   On: 09/06/2022 08:16    Microbiology: Results for orders  placed or performed during the hospital encounter of 08/26/22  MRSA Next Gen by PCR, Nasal     Status: None   Collection Time: 08/28/22  4:20 PM   Specimen: Nasal Mucosa; Nasal Swab  Result Value Ref Range Status   MRSA by PCR Next Gen NOT DETECTED NOT DETECTED Final    Comment: (NOTE) The GeneXpert MRSA Assay (FDA approved for NASAL specimens only), is one component of a comprehensive MRSA colonization surveillance program. It is not intended to diagnose MRSA infection nor to guide or monitor treatment for MRSA infections. Test performance is not FDA approved in patients less than 61 years old. Performed at [facility], [address] [place], [place] [address]   Gastrointestinal Panel by PCR , Stool     Status: None   Collection Time: 08/29/22  3:44 PM   Specimen: Stool  Result Value Ref Range Status   Campylobacter species NOT DETECTED NOT DETECTED Final   Plesimonas shigelloides NOT DETECTED NOT DETECTED Final   Salmonella species NOT DETECTED NOT DETECTED Final   Yersinia enterocolitica NOT DETECTED NOT DETECTED Final   Vibrio species NOT DETECTED NOT DETECTED Final   Vibrio cholerae NOT DETECTED NOT DETECTED Final   Enteroaggregative E coli (EAEC) NOT DETECTED NOT DETECTED Final   Enteropathogenic E coli (EPEC) NOT DETECTED NOT DETECTED Final   Enterotoxigenic E coli (ETEC) NOT DETECTED NOT DETECTED Final   Shiga like toxin producing E coli (STEC) NOT DETECTED NOT DETECTED Final   Shigella/Enteroinvasive E coli (EIEC) NOT DETECTED NOT DETECTED Final   Cryptosporidium NOT DETECTED NOT DETECTED Final   Cyclospora cayetanensis NOT DETECTED NOT DETECTED Final   Entamoeba histolytica NOT DETECTED NOT DETECTED Final   Giardia lamblia NOT DETECTED NOT DETECTED Final   Adenovirus F40/41 NOT DETECTED NOT DETECTED Final   Astrovirus NOT DETECTED NOT DETECTED Final   Norovirus GI/GII NOT DETECTED NOT DETECTED Final   Rotavirus A NOT DETECTED NOT DETECTED Final   Sapovirus  (I, II, IV, and V) NOT DETECTED NOT DETECTED Final    Comment: Performed at [facility], [address] [place], [place] [address]  Resp panel by RT-PCR (RSV, Flu A&B, Covid) Anterior Nasal Swab     Status: None   Collection Time: 09/05/22  1:52 PM   Specimen: Anterior Nasal Swab  Result Value Ref Range Status   SARS Coronavirus 2 by RT PCR NEGATIVE NEGATIVE Final    Comment: (NOTE) SARS-CoV-2 target nucleic acids  are NOT DETECTED.  The SARS-CoV-2 RNA is generally detectable in upper respiratory specimens during the acute phase of infection. The lowest concentration of SARS-CoV-2 viral copies this assay can detect is 138 copies/mL. A negative result does not preclude SARS-Cov-2 infection and should not be used as the sole basis for treatment or other patient management decisions. A negative result may occur with  improper specimen collection/handling, submission of specimen other than nasopharyngeal swab, presence of viral mutation(s) within the areas targeted by this assay, and inadequate number of viral copies(<138 copies/mL). A negative result must be combined with clinical observations, patient history, and epidemiological information. The expected result is Negative.  Fact Sheet for Patients:  EntrepreneurPulse.com.au  Fact Sheet for Healthcare Providers:  IncredibleEmployment.be  This test is no t yet approved or cleared by the [place] FDA and  has been authorized for detection and/or diagnosis of SARS-CoV-2 by FDA under an Emergency Use Authorization (EUA). This EUA will remain  in effect (meaning this test can be used) for the duration of the COVID-19 declaration under Section 564(b)(1) of the Act, 21 U.S.C.section 360bbb-3(b)(1), unless the authorization is terminated  or revoked sooner.       Influenza A by PCR NEGATIVE NEGATIVE Final   Influenza B by PCR NEGATIVE NEGATIVE Final    Comment: (NOTE) The Xpert  Xpress SARS-CoV-2/FLU/RSV plus assay is intended as an aid in the diagnosis of influenza from Nasopharyngeal swab specimens and should not be used as a sole basis for treatment. Nasal washings and aspirates are unacceptable for Xpert Xpress SARS-CoV-2/FLU/RSV testing.  Fact Sheet for Patients: EntrepreneurPulse.com.au  Fact Sheet for Healthcare Providers: IncredibleEmployment.be  This test is not yet approved or cleared by the [place] FDA and has been authorized for detection and/or diagnosis of SARS-CoV-2 by FDA under an Emergency Use Authorization (EUA). This EUA will remain in effect (meaning this test can be used) for the duration of the COVID-19 declaration under Section 564(b)(1) of the Act, 21 U.S.C. section 360bbb-3(b)(1), unless the authorization is terminated or revoked.     Resp Syncytial Virus by PCR NEGATIVE NEGATIVE Final    Comment: (NOTE) Fact Sheet for Patients: EntrepreneurPulse.com.au  Fact Sheet for Healthcare Providers: IncredibleEmployment.be  This test is not yet approved or cleared by the [place] FDA and has been authorized for detection and/or diagnosis of SARS-CoV-2 by FDA under an Emergency Use Authorization (EUA). This EUA will remain in effect (meaning this test can be used) for the duration of the COVID-19 declaration under Section 564(b)(1) of the Act, 21 U.S.C. section 360bbb-3(b)(1), unless the authorization is terminated or revoked.  Performed at [facility], [place], [place] [address]   Respiratory (~20 pathogens) panel by PCR     Status: None   Collection Time: 09/05/22  2:50 PM  Result Value Ref Range Status   Adenovirus NOT DETECTED NOT DETECTED Final   Coronavirus 229E NOT DETECTED NOT DETECTED Final    Comment: (NOTE) The Coronavirus on the Respiratory Panel, DOES NOT test for the novel  Coronavirus (2019 nCoV)     Coronavirus HKU1 NOT DETECTED NOT DETECTED Final   Coronavirus NL63 NOT DETECTED NOT DETECTED Final   Coronavirus OC43 NOT DETECTED NOT DETECTED Final   Metapneumovirus NOT DETECTED NOT DETECTED Final   Rhinovirus / Enterovirus NOT DETECTED NOT DETECTED Final   Influenza A NOT DETECTED NOT DETECTED Final   Influenza B NOT DETECTED NOT DETECTED Final   Parainfluenza Virus 1 NOT DETECTED NOT DETECTED  Final   Parainfluenza Virus 2 NOT DETECTED NOT DETECTED Final   Parainfluenza Virus 3 NOT DETECTED NOT DETECTED Final   Parainfluenza Virus 4 NOT DETECTED NOT DETECTED Final   Respiratory Syncytial Virus NOT DETECTED NOT DETECTED Final   Bordetella pertussis NOT DETECTED NOT DETECTED Final   Bordetella Parapertussis NOT DETECTED NOT DETECTED Final   Chlamydophila pneumoniae NOT DETECTED NOT DETECTED Final   Mycoplasma pneumoniae NOT DETECTED NOT DETECTED Final    Comment: Performed at [facility], [place] [address], [place], [place] [address]    Labs: CBC: Recent Labs  Lab 09/04/22 [phone] 09/05/22 0847 09/05/22 2214 09/07/22 0504 09/07/22 1458 09/08/22 0219 09/08/22 1646 09/09/22 0150 09/09/22 0434  WBC 10.2 9.1  --  11.8*  --   --   --   --  11.9*  HGB 7.5* 6.8*   < > 8.0* 8.9* 7.6* 8.0* 8.1* 7.8*  HCT 24.1* 21.6*   < > 25.4* 28.0* 24.2* 25.8* 25.6* 24.7*  MCV 85.5 84.4  --  84.1  --   --   --   --  84.3  PLT 183 206  --  235  --   --   --   --  268   < > = values in this interval not displayed.   Basic Metabolic Panel: Recent Labs  Lab 09/05/22 0847 09/07/22 0853 09/09/22 0720  NA 134* 135 133*  K 5.0 4.9 4.2  CL 96* 96* 97*  CO2 26 26 27  $ GLUCOSE 129* 135* 144*  BUN 72* 65* 50*  CREATININE 9.24* 8.58* 8.44*  CALCIUM 8.6* 9.1 8.9  PHOS 6.2*  --  5.4*   Liver Function Tests: Recent Labs  Lab 09/05/22 0847 09/09/22 0720  ALBUMIN 2.5* 2.5*   CBG: Recent Labs  Lab 09/08/22 1200 09/08/22 1611 09/08/22 2031 09/09/22 0750 09/09/22 1352  GLUCAP 170* 151*  152* 144* 116*    Discharge time spent: greater than 30 minutes.  Signed: Oran Rein, MD [facility] 09/09/2022

## 2022-09-09 NOTE — Progress Notes (Signed)
OT Cancellation Note  Patient Details Name: Cynthia Dean MRN: [id number] DOB: 1950/11/05   Cancelled Treatment:    Reason Eval/Treat Not Completed: Patient at procedure or test/ unavailable; Upon entering room, pt was getting ready to leave room for dialysis.    [name], MS, OTR/L  [name] 09/09/2022, 8:46 AM

## 2022-09-09 NOTE — Progress Notes (Signed)
Received patient in bed to unit.  Alert and oriented.  Informed consent signed and in chart.   [employer] duration:3.42  Patient tolerated well.  Transported back to the room  Alert, without acute distress.  Hand-off given to patient's nurse.   Access used: avf Access issues: none  Total UF removed: 853m Medication(s) given: epogen 10,000 units 1110mPost HD VS: see table below Post HD weight: 102.8kg   09/09/22 1323  Vitals  BP (!) 159/59  MAP (mmHg) 89  BP Location Right Arm  BP Method Automatic  Patient Position (if appropriate) Lying  Pulse Rate 69  Pulse Rate Source Monitor  ECG Heart Rate 69  Resp 17  Oxygen Therapy  SpO2 98 %  O2 Device Nasal Cannula  O2 Flow Rate (L/min) 2 L/min  Patient Activity (if Appropriate) In bed  Pulse Oximetry Type Continuous  During Treatment Monitoring  HD Safety Checks Performed Yes  Intra-Hemodialysis Comments Tolerated well  Post Treatment  Dialyzer Clearance Heavily streaked  Duration of HD Treatment -hour(s) 3.42 hour(s)  Hemodialysis Intake (mL) 0 mL  Liters Processed 86.7  Fluid Removed (mL) 800 mL  Tolerated HD Treatment Yes  Post-Hemodialysis Comments  (tx ended 18 minutes early, tmp raised up, cloptted was seen in the chamber. np notified.)  AVG/AVF Arterial Site Held (minutes) 10 minutes  AVG/AVF Venous Site Held (minutes) 10 minutes  Fistula / Graft Left Forearm Arteriovenous fistula  No Placement Date or Time found.   Orientation: Left  Access Location: Forearm  Access Type: Arteriovenous fistula  Site Condition No complications  Fistula / Graft Assessment Present;Thrill;Bruit  Status [facility]ed;[facility]

## 2022-09-09 NOTE — Progress Notes (Signed)
Report called to LPN Evetta at [facility], awaiting EMS for transport

## 2022-09-09 NOTE — TOC Transition Note (Signed)
Transition of Care [facility]) - CM/SW Discharge Note   Patient Details  Name: Cynthia Dean MRN: [id number] Date of Birth: 05/10/1951  [facility] CM/SW Contact:  Gerilyn Pilgrim, LCSW Phone Number: 09/09/2022, 2:37 PM   Clinical Narrative:   Pt discharging to [facility].  CSW will send DC summary and call EMS once DC summary is in. Medical necessity form printed to unit.           Patient Goals and CMS Choice      Discharge Placement                         Discharge Plan and Services Additional resources added to the After Visit Summary for                                       Social Determinants of Health (SDOH) Interventions SDOH Screenings   Food Insecurity: No Food Insecurity (08/27/2022)  Housing: Low Risk  (08/27/2022)  Transportation Needs: No Transportation Needs (08/27/2022)  Utilities: Not At Risk (08/27/2022)  Financial Resource Strain: Low Risk  (11/12/2017)  Physical Activity: Insufficiently Active (11/12/2017)  Social Connections: Moderately Integrated (11/12/2017)  Stress: No Stress Concern Present (11/12/2017)  Tobacco Use: Medium Risk (09/01/2022)     Readmission Risk Interventions     No data to display

## 2022-09-21 ENCOUNTER — Telehealth (INDEPENDENT_AMBULATORY_CARE_PROVIDER_SITE_OTHER): Payer: Self-pay

## 2022-09-21 NOTE — Telephone Encounter (Signed)
Lenise Herald called from [facility] and left a voicemail asking for you to return his call about getting Kailyn on the schedule for a fistulagram. Can you please advise?  Call back number [phone]

## 2022-09-22 ENCOUNTER — Encounter: Payer: Self-pay | Admitting: Vascular Surgery

## 2022-09-22 ENCOUNTER — Telehealth (INDEPENDENT_AMBULATORY_CARE_PROVIDER_SITE_OTHER): Payer: Self-pay

## 2022-09-22 ENCOUNTER — Encounter
Admission: AD | Disposition: A | Discharge status: Home / Self Care | Payer: Self-pay | Source: Ambulatory Visit | Attending: Vascular Surgery

## 2022-09-22 ENCOUNTER — Ambulatory Visit
Admission: AD | Admit: 2022-09-22 | Discharge: 2022-09-22 | Disposition: A | Discharge status: Home / Self Care | Payer: 59 | Source: Ambulatory Visit | Attending: Vascular Surgery | Admitting: Vascular Surgery

## 2022-09-22 ENCOUNTER — Other Ambulatory Visit: Payer: Self-pay

## 2022-09-22 DIAGNOSIS — I120 Hypertensive chronic kidney disease with stage 5 chronic kidney disease or end stage renal disease: Secondary | ICD-10-CM

## 2022-09-22 DIAGNOSIS — I2510 Atherosclerotic heart disease of native coronary artery without angina pectoris: Secondary | ICD-10-CM | POA: Diagnosis not present

## 2022-09-22 DIAGNOSIS — N186 End stage renal disease: Secondary | ICD-10-CM | POA: Insufficient documentation

## 2022-09-22 DIAGNOSIS — E1122 Type 2 diabetes mellitus with diabetic chronic kidney disease: Secondary | ICD-10-CM | POA: Insufficient documentation

## 2022-09-22 DIAGNOSIS — T82898A Other specified complication of vascular prosthetic devices, implants and grafts, initial encounter: Secondary | ICD-10-CM

## 2022-09-22 DIAGNOSIS — Z992 Dependence on renal dialysis: Secondary | ICD-10-CM | POA: Diagnosis not present

## 2022-09-22 DIAGNOSIS — T82868A Thrombosis of vascular prosthetic devices, implants and grafts, initial encounter: Secondary | ICD-10-CM

## 2022-09-22 DIAGNOSIS — Z87891 Personal history of nicotine dependence: Secondary | ICD-10-CM | POA: Insufficient documentation

## 2022-09-22 DIAGNOSIS — Y841 Kidney dialysis as the cause of abnormal reaction of the patient, or of later complication, without mention of misadventure at the time of the procedure: Secondary | ICD-10-CM | POA: Insufficient documentation

## 2022-09-22 DIAGNOSIS — T82858A Stenosis of vascular prosthetic devices, implants and grafts, initial encounter: Secondary | ICD-10-CM | POA: Diagnosis present

## 2022-09-22 HISTORY — PX: A/V FISTULAGRAM: CATH118298

## 2022-09-22 LAB — POTASSIUM (ARMC VASCULAR LAB ONLY): Potassium (ARMC vascular lab): 4 mmol/L (ref 3.5–5.1)

## 2022-09-22 LAB — GLUCOSE, CAPILLARY: Glucose-Capillary: 122 mg/dL — ABNORMAL HIGH (ref 70–99)

## 2022-09-22 SURGERY — A/V FISTULAGRAM
Anesthesia: Moderate Sedation | Laterality: Left

## 2022-09-22 MED ORDER — DIPHENHYDRAMINE HCL 50 MG/ML IJ SOLN: 50.0000 mg | Freq: Once | INTRAMUSCULAR | Status: DC | PRN

## 2022-09-22 MED ORDER — HEPARIN SODIUM (PORCINE) 1000 UNIT/ML IJ SOLN
INTRAMUSCULAR | Status: AC
  Filled 2022-09-22: qty 10

## 2022-09-22 MED ORDER — HEPARIN SOD (PORK) LOCK FLUSH 100 UNIT/ML IV SOLN
INTRAVENOUS | Status: AC
  Filled 2022-09-22: qty 5

## 2022-09-22 MED ORDER — FENTANYL CITRATE PF 50 MCG/ML IJ SOSY
PREFILLED_SYRINGE | INTRAMUSCULAR | Status: AC
  Filled 2022-09-22: qty 1

## 2022-09-22 MED ORDER — METHYLPREDNISOLONE SODIUM SUCC 125 MG IJ SOLR: 125.0000 mg | Freq: Once | INTRAMUSCULAR | Status: DC | PRN

## 2022-09-22 MED ORDER — MIDAZOLAM HCL 2 MG/2ML IJ SOLN
INTRAMUSCULAR | Status: DC | PRN
  Administered 2022-09-22: 1 mg via INTRAVENOUS

## 2022-09-22 MED ORDER — MIDAZOLAM HCL 2 MG/ML PO SYRP: 8.0000 mg | ORAL_SOLUTION | Freq: Once | ORAL | Status: DC | PRN

## 2022-09-22 MED ORDER — MIDAZOLAM HCL 2 MG/2ML IJ SOLN
INTRAMUSCULAR | Status: AC
  Filled 2022-09-22: qty 2

## 2022-09-22 MED ORDER — CEFAZOLIN SODIUM-DEXTROSE 1-4 GM/50ML-% IV SOLN
INTRAVENOUS | Status: AC
  Filled 2022-09-22: qty 50

## 2022-09-22 MED ORDER — IODIXANOL 320 MG/ML IV SOLN
INTRAVENOUS | Status: DC | PRN
  Administered 2022-09-22: 30 mL via INTRA_ARTERIAL

## 2022-09-22 MED ORDER — CEFAZOLIN SODIUM-DEXTROSE 1-4 GM/50ML-% IV SOLN: 1.0000 g | INTRAVENOUS | Status: DC

## 2022-09-22 MED ORDER — HYDROMORPHONE HCL 1 MG/ML IJ SOLN: 1.0000 mg | Freq: Once | INTRAMUSCULAR | Status: DC | PRN

## 2022-09-22 MED ORDER — HEPARIN SODIUM (PORCINE) 1000 UNIT/ML IJ SOLN
INTRAMUSCULAR | Status: DC | PRN
  Administered 2022-09-22: 3000 [IU] via INTRAVENOUS

## 2022-09-22 MED ORDER — FAMOTIDINE 20 MG PO TABS: 40.0000 mg | ORAL_TABLET | Freq: Once | ORAL | Status: DC | PRN

## 2022-09-22 MED ORDER — SODIUM CHLORIDE 0.9 % IV SOLN: INTRAVENOUS | Status: DC

## 2022-09-22 MED ORDER — FENTANYL CITRATE (PF) 100 MCG/2ML IJ SOLN
INTRAMUSCULAR | Status: DC | PRN
  Administered 2022-09-22: 25 ug via INTRAVENOUS

## 2022-09-22 MED ORDER — CEFAZOLIN SODIUM-DEXTROSE 1-4 GM/50ML-% IV SOLN
INTRAVENOUS | Status: DC | PRN
  Administered 2022-09-22: 1 g via INTRAVENOUS

## 2022-09-22 SURGICAL SUPPLY — 20 items
BALLN DORADO 8X100X80 (BALLOONS) ×1
BALLN LUTONIX 5X150X130 (BALLOONS) ×1
BALLN LUTONIX 7X100X130 (BALLOONS) ×1
BALLOON DORADO 8X100X80 (BALLOONS) IMPLANT
BALLOON LUTONIX 5X150X130 (BALLOONS) IMPLANT
BALLOON LUTONIX 7X100X130 (BALLOONS) IMPLANT
CANNULA 5F STIFF (CANNULA) IMPLANT
CATH BEACON 5 .035 40 KMP TP (CATHETERS) IMPLANT
CATH BEACON 5 .038 40 KMP TP (CATHETERS) ×1
DRAPE BRACHIAL (DRAPES) IMPLANT
GLIDESHEATH SLENDER 7FR .021G (SHEATH) IMPLANT
GLIDEWIRE ADV .035X180CM (WIRE) IMPLANT
KIT ENCORE 26 ADVANTAGE (KITS) IMPLANT
PACK ANGIOGRAPHY (CUSTOM PROCEDURE TRAY) ×1 IMPLANT
SHEATH BRITE TIP 6FRX5.5 (SHEATH) IMPLANT
SHEATH HALO 035 6FRX10 (SHEATH) IMPLANT
STENT VIABAHN 8X150X120 (Permanent Stent) ×1 IMPLANT
STENT VIABAHN 8X15X120 7FR (Permanent Stent) IMPLANT
SUT MNCRL AB 4-0 PS2 18 (SUTURE) IMPLANT
WIRE G V18X300CM (WIRE) IMPLANT

## 2022-09-22 NOTE — H&P (Signed)
[facility] Admission History & Physical  MRN : [id number]  Cynthia Dean is a 72 y.o. (05-Apr-1951) female who presents with chief complaint of No chief complaint on file. [name]  History of Present Illness: I am asked to evaluate the patient by the dialysis center. The patient was sent here because they were unable to achieve adequate dialysis this morning. Furthermore the Center states there is very poor thrill and bruit. The patient states there there have been increasing problems with the access, such as "pulling clots" during dialysis and prolonged bleeding after decannulation. The patient estimates these problems have been going on for several weeks. The patient is unaware of any other change.   Patient denies pain or tenderness overlying the access.  There is no pain with dialysis.  The patient denies hand pain or finger pain consistent with steal syndrome.    There have been past interventions or declots of this access.  The patient is not chronically hypotensive on dialysis.  Current Facility-Administered Medications  Medication Dose Route Frequency Provider Last Rate Last Admin   0.9 %  sodium chloride infusion   Intravenous Continuous Kris Hartmann, NP       ceFAZolin (ANCEF) 1-4 GM/50ML-% IVPB            ceFAZolin (ANCEF) IVPB 1 g/50 mL premix  1 g Intravenous 30 min Pre-Op Kris Hartmann, NP       diphenhydrAMINE (BENADRYL) injection 50 mg  50 mg Intravenous Once PRN Kris Hartmann, NP       famotidine (PEPCID) tablet 40 mg  40 mg Oral Once PRN Kris Hartmann, NP       HYDROmorphone (DILAUDID) injection 1 mg  1 mg Intravenous Once PRN Kris Hartmann, NP       methylPREDNISolone sodium succinate (SOLU-MEDROL) 125 mg/2 mL injection 125 mg  125 mg Intravenous Once PRN Kris Hartmann, NP       midazolam (VERSED) 2 MG/ML syrup 8 mg  8 mg Oral Once PRN Kris Hartmann, NP        Past Medical History:  Diagnosis Date   Acute on chronic respiratory  failure with hypoxia ([place]) 07/30/2019   Acute respiratory failure with hypoxia ([place]) 12/31/2018   Carotid arterial disease ([place])    a. 02/2018 < bilat ICA stenosis.   COPD (chronic obstructive pulmonary disease) ([facility])    Coronary artery disease    a. 11/2015 MV: EF 57%, no ischemia/infarct.   Cryptogenic stroke ([place])    a. 10/2017 s/p implantable loop recorder (no Afib to date).   Diabetes mellitus without complication ([place])    Diarrhea    ESRD on dialysis [facility])    GI bleed    a. 01/2019 EGD/Colonoscopy: Mild gastritis. Colitis.   Heart murmur    a. 12/2018 Echo: EF 55-60%, impaired relaxation. Mod dil LA. Mildly dil RA. Mild Ca2+ of AoV.   Hematemesis 06/27/2017   History of 2019 novel coronavirus disease (COVID-19) 07/30/2019   Hyperkalemia    Hyperlipidemia    Hypertension    Intractable nausea and vomiting 08/08/2015   Nausea & vomiting 05/30/2018   Nausea and vomiting 05/29/2018   Pneumonia due to COVID-19 virus     Past Surgical History:  Procedure Laterality Date   A/V FISTULAGRAM Left 12/20/2016   Procedure: A/V Fistulagram;  Surgeon: Katha Cabal, MD;  Location: [place] [facility];  Service: Cardiovascular;  Laterality: Left;   A/V SHUNT INTERVENTION N/A 12/20/2016  Procedure: A/V Shunt Intervention;  Surgeon: Katha Cabal, MD;  Location: [place] [facility];  Service: Cardiovascular;  Laterality: N/A;   A/V SHUNTOGRAM Left 09/11/2017   Procedure: A/V SHUNTOGRAM;  Surgeon: Algernon Huxley, MD;  Location: [place] [facility];  Service: Cardiovascular;  Laterality: Left;   A/V SHUNTOGRAM Left 11/21/2019   Procedure: A/V SHUNTOGRAM;  Surgeon: Algernon Huxley, MD;  Location: [place] [facility];  Service: Cardiovascular;  Laterality: Left;   BREAST BIOPSY Bilateral 07/19/2000   neg   BREAST BIOPSY Left 11/03/2021   [place] bx mass at 3:00, venus marker, axilla bx-hyrdo marker, path pending   COLONOSCOPY WITH PROPOFOL N/A 02/16/2019   Procedure: COLONOSCOPY WITH PROPOFOL;   Surgeon: Toledo, Benay Pike, MD;  Location: [facility];  Service: Gastroenterology;  Laterality: N/A;   COLONOSCOPY WITH PROPOFOL N/A 08/31/2022   Procedure: COLONOSCOPY WITH PROPOFOL;  Surgeon: Annamaria Helling, DO;  Location: [facility];  Service: Gastroenterology;  Laterality: N/A;   ESOPHAGOGASTRODUODENOSCOPY (EGD) WITH PROPOFOL N/A 02/16/2019   Procedure: ESOPHAGOGASTRODUODENOSCOPY (EGD) WITH PROPOFOL;  Surgeon: Toledo, Benay Pike, MD;  Location: [facility];  Service: Gastroenterology;  Laterality: N/A;   ESOPHAGOGASTRODUODENOSCOPY (EGD) WITH PROPOFOL N/A 08/28/2022   Procedure: ESOPHAGOGASTRODUODENOSCOPY (EGD) WITH PROPOFOL;  Surgeon: Annamaria Helling, DO;  Location: [facility] [place];  Service: Gastroenterology;  Laterality: N/A;   IR US GUIDE BX ASP/DRAIN  03/25/2022   LOOP RECORDER INSERTION N/A 11/16/2017   Procedure: LOOP RECORDER INSERTION;  Surgeon: Deboraha Sprang, MD;  Location: [place] [facility];  Service: Cardiovascular;  Laterality: N/A;   PERIPHERAL VASCULAR CATHETERIZATION Left 02/02/2015   Procedure: A/V Shuntogram/Fistulagram;  Surgeon: Algernon Huxley, MD;  Location: [place] [facility];  Service: Cardiovascular;  Laterality: Left;   PERIPHERAL VASCULAR CATHETERIZATION Left 02/02/2015   Procedure: A/V Shunt Intervention;  Surgeon: Algernon Huxley, MD;  Location: [place] [facility];  Service: Cardiovascular;  Laterality: Left;   PERIPHERAL VASCULAR CATHETERIZATION Left 03/09/2015   Procedure: A/V Shuntogram/Fistulagram;  Surgeon: Algernon Huxley, MD;  Location: [place] [facility];  Service: Cardiovascular;  Laterality: Left;   PERIPHERAL VASCULAR CATHETERIZATION N/A 03/09/2015   Procedure: A/V Shunt Intervention;  Surgeon: Algernon Huxley, MD;  Location: [place] [facility];  Service: Cardiovascular;  Laterality: N/A;   PERIPHERAL VASCULAR CATHETERIZATION Left 04/17/2015   Procedure: Upper Extremity Angiography;  Surgeon: Algernon Huxley, MD;  Location: [place] [facility];   Service: Cardiovascular;  Laterality: Left;   PERIPHERAL VASCULAR CATHETERIZATION  04/17/2015   Procedure: Upper Extremity Intervention;  Surgeon: Algernon Huxley, MD;  Location: [place] [facility];  Service: Cardiovascular;;   PERIPHERAL VASCULAR CATHETERIZATION N/A 08/10/2015   Procedure: A/V Shuntogram/Fistulagram;  Surgeon: Algernon Huxley, MD;  Location: [place] [facility];  Service: Cardiovascular;  Laterality: N/A;   PERIPHERAL VASCULAR CATHETERIZATION N/A 08/10/2015   Procedure: A/V Shunt Intervention;  Surgeon: Algernon Huxley, MD;  Location: [place] [facility];  Service: Cardiovascular;  Laterality: N/A;   PERIPHERAL VASCULAR CATHETERIZATION Left 04/11/2016   Procedure: A/V Shuntogram/Fistulagram;  Surgeon: Algernon Huxley, MD;  Location: [place] [facility];  Service: Cardiovascular;  Laterality: Left;   TEE WITHOUT CARDIOVERSION N/A 11/16/2017   Procedure: TRANSESOPHAGEAL ECHOCARDIOGRAM (TEE);  Surgeon: Minna Merritts, MD;  Location: [facility];  Service: Cardiovascular;  Laterality: N/A;    Social History   Tobacco Use   Smoking status: Former    Packs/day: 0.00    Types: Cigarettes   Smokeless tobacco:  Never  Vaping Use   Vaping Use: Never used  Substance Use Topics   Alcohol use: No   Drug use: No    Family History  Problem Relation Age of Onset   Breast cancer Father 84   Stroke Mother    Heart attack Mother    Heart Problems Sister    Breast cancer Cousin 62       1 st cousin. Maternal     No family history of bleeding or clotting disorders, autoimmune disease or porphyria  Allergies  Allergen Reactions   Oxycodone Nausea And Vomiting   Oxycodone-Acetaminophen Other (See Comments)   Wound Dressing Adhesive    Hydrocodone     Intolerant more than allergic   Tape Itching    Skin Dermatitis/itching (tape adhesive) Skin Dermatitis/itching (tape adhesive)   Tapentadol Itching    Skin Dermatitis/itching (tape adhesive) Skin Dermatitis/itching (tape adhesive)       REVIEW OF SYSTEMS (Negative unless checked)  Constitutional: '[]'$ Weight loss  '[]'$ Fever  '[]'$ Chills Cardiac: '[]'$ Chest pain   '[]'$ Chest pressure   '[]'$ Palpitations   '[]'$ Shortness of breath when laying flat   '[]'$ Shortness of breath at rest   '[x]'$ Shortness of breath with exertion. Vascular:  '[]'$ Pain in legs with walking   '[]'$ Pain in legs at rest   '[]'$ Pain in legs when laying flat   '[]'$ Claudication   '[]'$ Pain in feet when walking  '[]'$ Pain in feet at rest  '[]'$ Pain in feet when laying flat   '[]'$ History of DVT   '[]'$ Phlebitis   '[]'$ Swelling in legs   '[]'$ Varicose veins   '[]'$ Non-healing ulcers Pulmonary:   '[x]'$ Uses home oxygen   '[]'$ Productive cough   '[]'$ Hemoptysis   '[]'$ Wheeze  '[x]'$ COPD   '[]'$ Asthma Neurologic:  '[]'$ Dizziness  '[]'$ Blackouts   '[]'$ Seizures   '[]'$ History of stroke   '[]'$ History of TIA  '[]'$ Aphasia   '[]'$ Temporary blindness   '[]'$ Dysphagia   '[]'$ Weakness or numbness in arms   '[]'$ Weakness or numbness in legs Musculoskeletal:  '[x]'$ Arthritis   '[]'$ Joint swelling   '[x]'$ Joint pain   '[]'$ Low back pain Hematologic:  '[]'$ Easy bruising  '[]'$ Easy bleeding   '[]'$ Hypercoagulable state   '[x]'$ Anemic  '[]'$ Hepatitis Gastrointestinal:  '[]'$ Blood in stool   '[]'$ Vomiting blood  '[]'$ Gastroesophageal reflux/heartburn   '[]'$ Difficulty swallowing. Genitourinary:  '[x]'$ Chronic kidney disease   '[]'$ Difficult urination  '[]'$ Frequent urination  '[]'$ Burning with urination   '[]'$ Blood in urine Skin:  '[]'$ Rashes   '[]'$ Ulcers   '[]'$ Wounds Psychological:  '[]'$ History of anxiety   '[]'$  History of major depression.  Physical Examination  There were no vitals filed for this visit. There is no height or weight on file to calculate BMI. Gen: WD/WN, NAD Head: [place]/AT, No temporalis wasting.  Ear/Nose/Throat: Hearing grossly intact, nares w/o erythema or drainage, oropharynx w/o Erythema/Exudate,  Eyes: Conjunctiva clear, sclera non-icteric Neck: Trachea midline.  No JVD.  Pulmonary:  Good air movement, respirations not labored, no use of accessory muscles.  Cardiac: RRR, normal S1, S2. Vascular:  Vessel Right Left   Radial Palpable Palpable   Musculoskeletal: M/S 5/5 throughout.  Extremities without ischemic changes.  No deformity or atrophy.  Neurologic: Sensation grossly intact in extremities.  Symmetrical.  Speech is fluent. Motor exam as listed above. Psychiatric: Judgment intact, Mood & affect appropriate for pt's clinical situation. Dermatologic: No rashes or ulcers noted.  No cellulitis or open wounds.    CBC Lab Results  Component Value Date   WBC 10.8 (H) 09/09/2022   HGB 8.7 (L) 09/09/2022   HCT 27.7 (L) 09/09/2022   MCV 85.0 09/09/2022  PLT 286 09/09/2022    BMET    Component Value Date/Time   NA 133 (L) 09/09/2022 0720   NA 135 (L) 06/16/2014 0915   K 4.2 09/09/2022 0720   K 4.8 06/16/2014 0915   CL 97 (L) 09/09/2022 0720   CL 100 06/16/2014 0915   CO2 27 09/09/2022 0720   CO2 22 06/16/2014 0915   GLUCOSE 144 (H) 09/09/2022 0720   GLUCOSE 226 (H) 06/16/2014 0915   BUN 50 (H) 09/09/2022 0720   BUN 47 (H) 06/16/2014 0915   CREATININE 8.44 (H) 09/09/2022 0720   CREATININE 7.60 (H) 06/16/2014 0915   CALCIUM 8.9 09/09/2022 0720   CALCIUM 7.2 (L) 06/16/2014 0915   GFRNONAA 5 (L) 09/09/2022 0720   GFRNONAA 6 (L) 06/16/2014 0915   GFRNONAA 6 (L) 04/07/2014 0918   GFRAA 5 (L) 01/30/2020 0500   GFRAA 7 (L) 06/16/2014 0915   GFRAA 7 (L) 04/07/2014 0918   Estimated Creatinine Clearance: 7.8 mL/min (A) (by C-G formula based on SCr of 8.44 mg/dL (H)).  COAG Lab Results  Component Value Date   INR 1.7 (H) 03/24/2022   INR 1.2 03/19/2022   INR 1.1 07/30/2019    Radiology DG Chest Port 1 View  Result Date: 09/06/2022 CLINICAL DATA:  Abnormal lung sounds EXAM: PORTABLE CHEST 1 VIEW COMPARISON:  Chest x-ray dated April 29, 2022 FINDINGS: Unchanged cardiomegaly. Loop recorder device. Right chest wall port. New diffuse interstitial opacities. No large pleural effusion. No evidence of pneumothorax. IMPRESSION: New diffuse reticulonodular opacities, likely due to edema or  infection. Electronically Signed   By: Yetta Glassman M.D.   On: 09/06/2022 08:16    Assessment/Plan 1.  Complication dialysis device:  Patient's dialysis access is malfunctioning. The patient will undergo angiography and correction of any problems using interventional techniques with the hope of restoring function to the access.  The risks and benefits were described to the patient.  All questions were answered.  The patient agrees to proceed with angiography and intervention. Potassium will be drawn to ensure that it is an appropriate level prior to performing intervention. 2.  End-stage renal disease requiring hemodialysis:  Patient will continue dialysis therapy without further interruption if a successful intervention is not achieved then a tunneled catheter will be placed. Dialysis has already been arranged. 3.  Hypertension:  Patient will continue medical management; nephrology is following no changes in oral medications. 4. Diabetes mellitus:  Glucose will be monitored and oral medications been held this morning once the patient has undergone the patient's procedure po intake will be reinitiated and again Accu-Cheks will be used to assess the blood glucose level and treat as needed. The patient will be restarted on the patient's usual hypoglycemic regime 5.  Coronary artery disease:  EKG will be monitored. Nitrates will be used if needed. The patient's oral cardiac medications will be continued.    Leotis Pain, MD  09/22/2022 2:29 PM

## 2022-09-22 NOTE — Telephone Encounter (Signed)
Spoke with Arna Medici at [facility] and discussed the patient having her left arm fistulagram today with Dr. Lucky Cowboy with a 2:15 pm arrival time to [facility]. Pre-procedure instructions were discussed and I was told by Arna Medici that the patient has been NPO since midnight.

## 2022-09-22 NOTE — Op Note (Signed)
[facility]    OPERATIVE NOTE   PROCEDURE: 1.  Left brachial artery to axillary vein arteriovenous graft cannulation under ultrasound guidance 2.  Left arm shuntogram 3.  Percutaneous transluminal angioplasty of the venous anastomosis and graft with a 5 mm diameter by 15 cm length Lutonix drug-coated angioplasty balloon 4.  Stent placement to the AV graft with 8 mm diameter by 15 cm length Viabahn stent  PRE-OPERATIVE DIAGNOSIS: 1. ESRD 2. Malfunctioning left brachial artery to axillary vein arteriovenous graft  POST-OPERATIVE DIAGNOSIS: same as above   SURGEON: Leotis Pain, MD  ANESTHESIA: local with MCS  ESTIMATED BLOOD LOSS: 10 cc  FINDING(S): Mangled stents within the AV graft with near occlusive stenosis and chronic thrombus.  There is also about a 75 to 80% stenosis of the venous anastomosis and multiple veins draining the outflow at the axillary vein  SPECIMEN(S):  None  CONTRAST: 30 cc  FLUORO TIME: 7.1 minutes  MODERATE CONSCIOUS SEDATION TIME:  Approximately 40 minutes using 1 mg of Versed and 25 mcg of Fentanyl  INDICATIONS: Cynthia Dean is a 72 y.o. female who presents with malfunctioning left brachial artery to axillary vein arteriovenous graft.  The patient is scheduled for left arm shuntogram.  The patient is aware the risks include but are not limited to: bleeding, infection, thrombosis of the cannulated access, and possible anaphylactic reaction to the contrast.  The patient is aware of the risks of the procedure and elects to proceed forward.  DESCRIPTION: After full informed written consent was obtained, the patient was brought back to the angiography suite and placed supine upon the angiography table.  The patient was connected to monitoring equipment. Moderate conscious sedation was administered during a face to face encounter throughout the procedure with my supervision of the RN administering medicines and monitoring the  patient's vital signs, pulse oximetry, telemetry and mental status throughout from the start of the procedure until the patient was taken to the recovery room The left arm was prepped and draped in the standard fashion for a percutaneous access intervention.  Under ultrasound guidance, the portion of the left brachial artery to axillary vein arteriovenous graft nearest the arterial anastomosis was cannulated with a micropuncture needle under direct ultrasound guidance were it was patent and a permanent image was performed.  The microwire was advanced into the graft and the needle was exchanged for the a microsheath.  I then upsized to a 6 Fr Sheath and later a 7 French sheath and imaging was performed.  Hand injections were completed to image the access including the central venous system. This demonstrated mangled stents within the AV graft with near occlusive stenosis and chronic thrombus.  There is also about a 75 to 80% stenosis of the venous anastomosis and multiple veins draining the outflow at the axillary vein.  Based on the images, this patient will need extensive treatment to salvage the graft. I then gave the patient 3000 units of intravenous heparin.  I then crossed the stenosis with a Terumo advantage wire.  I then exchanged for a V18 wire.  A 5 mm diameter by 15 cm length Lutonix drug-coated angioplasty balloon was inflated twice first across the venous anastomosis and the axillary vein and then into the graft and the second inflation was entirely within the AV graft.  This was inflated to 12 atm but there remained high-grade residual narrowing within the mangled stents and clearly covered stents were going to be required within the graft.  An 8 mm diameter by 15 cm length Viabahn stent was then deployed within the graft to cover the old stents and encompassed all the areas of stenosis including the area closer to the arterial anastomosis that was stenotic outside of the previous stent placement.  An  8 mm diameter by 10 cm length [place] angioplasty balloon was used to post dilate within the graft and then a 7 mm diameter by 10 cm length Lutonix drug-coated angioplasty balloon was used to post dilate the most proximal portion of the graft near the venous anastomosis as well as the venous anastomosis.  I did not want to stent across the venous anastomosis and impair some of the additional outflow from the branch veins in the area.  On completion imaging, a less than 20% residual stenosis was present and there was brisk flow through the graft.     Based on the completion imaging, no further intervention is necessary.  The wire and balloon were removed from the sheath.  A 4-0 Monocryl purse-string suture was sewn around the sheath.  The sheath was removed while tying down the suture.  A sterile bandage was applied to the puncture site.  COMPLICATIONS: None  CONDITION: Stable   Leotis Pain  09/22/2022 3:51 PM    This note was created with Dragon Medical transcription system. Any errors in dictation are purely unintentional.

## 2022-09-23 ENCOUNTER — Encounter: Payer: Self-pay | Admitting: Vascular Surgery

## 2022-09-26 ENCOUNTER — Encounter: Payer: Self-pay | Admitting: Vascular Surgery

## 2022-10-05 ENCOUNTER — Other Ambulatory Visit: Payer: Self-pay

## 2022-10-05 ENCOUNTER — Emergency Department
Admission: EM | Admit: 2022-10-05 | Discharge: 2022-10-06 | Disposition: A | Discharge status: Home / Self Care | Payer: 59 | Attending: Emergency Medicine | Admitting: Emergency Medicine

## 2022-10-05 DIAGNOSIS — Z992 Dependence on renal dialysis: Secondary | ICD-10-CM | POA: Insufficient documentation

## 2022-10-05 DIAGNOSIS — R799 Abnormal finding of blood chemistry, unspecified: Secondary | ICD-10-CM | POA: Diagnosis present

## 2022-10-05 DIAGNOSIS — D649 Anemia, unspecified: Secondary | ICD-10-CM

## 2022-10-05 DIAGNOSIS — D638 Anemia in other chronic diseases classified elsewhere: Secondary | ICD-10-CM | POA: Insufficient documentation

## 2022-10-05 DIAGNOSIS — N186 End stage renal disease: Secondary | ICD-10-CM | POA: Insufficient documentation

## 2022-10-05 LAB — CBC
HCT: 23.4 % — ABNORMAL LOW (ref 36.0–46.0)
Hemoglobin: 6.8 g/dL — ABNORMAL LOW (ref 12.0–15.0)
MCH: 24.7 pg — ABNORMAL LOW (ref 26.0–34.0)
MCHC: 29.1 g/dL — ABNORMAL LOW (ref 30.0–36.0)
MCV: 85.1 fL (ref 80.0–100.0)
Platelets: 241 10*3/uL (ref 150–400)
RBC: 2.75 MIL/uL — ABNORMAL LOW (ref 3.87–5.11)
RDW: 19.4 % — ABNORMAL HIGH (ref 11.5–15.5)
WBC: 10.4 10*3/uL (ref 4.0–10.5)
nRBC: 0 % (ref 0.0–0.2)

## 2022-10-05 LAB — COMPREHENSIVE METABOLIC PANEL
ALT: 11 U/L (ref 0–44)
AST: 13 U/L — ABNORMAL LOW (ref 15–41)
Albumin: 2.8 g/dL — ABNORMAL LOW (ref 3.5–5.0)
Alkaline Phosphatase: 80 U/L (ref 38–126)
Anion gap: 7 (ref 5–15)
BUN: 7 mg/dL — ABNORMAL LOW (ref 8–23)
CO2: 27 mmol/L (ref 22–32)
Calcium: 8.5 mg/dL — ABNORMAL LOW (ref 8.9–10.3)
Chloride: 102 mmol/L (ref 98–111)
Creatinine, Ser: 2.81 mg/dL — ABNORMAL HIGH (ref 0.44–1.00)
GFR, Estimated: 17 mL/min — ABNORMAL LOW (ref >60)
Glucose, Bld: 112 mg/dL — ABNORMAL HIGH (ref 70–99)
Potassium: 2.9 mmol/L — ABNORMAL LOW (ref 3.5–5.1)
Sodium: 136 mmol/L (ref 135–145)
Total Bilirubin: 0.9 mg/dL (ref 0.3–1.2)
Total Protein: 7.5 g/dL (ref 6.5–8.1)

## 2022-10-05 LAB — PREPARE RBC (CROSSMATCH)

## 2022-10-05 MED ORDER — ACETAMINOPHEN 325 MG PO TABS
650.0000 mg | ORAL_TABLET | Freq: Once | ORAL | Status: AC
  Administered 2022-10-05: 650 mg via ORAL
  Filled 2022-10-05: qty 2

## 2022-10-05 MED ORDER — SODIUM CHLORIDE 0.9 % IV SOLN
10.0000 mL/h | Freq: Once | INTRAVENOUS | Status: AC
  Administered 2022-10-06: 10 mL/h via INTRAVENOUS

## 2022-10-05 NOTE — ED Notes (Addendum)
Muscle relaxers sent to [facility] -daughter called to request

## 2022-10-05 NOTE — ED Provider Notes (Signed)
[facility] Provider Note   Event Date/Time   First MD Initiated Contact with Patient 10/05/22 1457     (approximate) History  Abnormal Labs   HPI Cynthia Dean is a 72 y.o. female with a past medical history of ESRD on dialysis who presents after routine blood work showed hemoglobin of 6.5.  Patient does state that she has had increased exercise intolerance as well as orthostatic lightheadedness over the past few weeks.  Patient states that she has the symptoms frequently when she needs to get blood transfusions and states that she is comfortable receiving a transfusion and going home today. ROS: Patient currently denies any vision changes, tinnitus, difficulty speaking, facial droop, sore throat, chest pain, shortness of breath, abdominal pain, nausea/vomiting/diarrhea, dysuria, or numbness/paresthesias in any extremity   Physical Exam  Triage Vital Signs: ED Triage Vitals  Enc Vitals Group     BP 10/05/22 1229 (!) 175/77     Pulse Rate 10/05/22 1229 (!) 101     Resp 10/05/22 1229 15     Temp 10/05/22 1229 98.3 F (36.8 C)     Temp Source 10/05/22 1229 Oral     SpO2 10/05/22 1229 92 %     Weight --      Height 10/05/22 1229 '5\' 8"'$  (1.727 m)     Head Circumference --      Peak Flow --      Pain Score 10/05/22 1211 0     Pain Loc --      Pain Edu? --      Excl. in [place]? --    Most recent vital signs: Vitals:   10/05/22 1229  BP: (!) 175/77  Pulse: (!) 101  Resp: 15  Temp: 98.3 F (36.8 C)  SpO2: 92%   General: Awake, oriented x4. CV:  Good peripheral perfusion.  Resp:  Normal effort.  Abd:  No distention.  Other:  Elderly the obese African-American female laying in bed in no acute distress ED Results / Procedures / Treatments  Labs (all labs ordered are listed, but only abnormal results are displayed) Labs Reviewed  CBC - Abnormal; Notable for the following components:      Result Value   RBC 2.75 (*)    Hemoglobin 6.8 (*)    HCT  23.4 (*)    MCH 24.7 (*)    MCHC 29.1 (*)    RDW 19.4 (*)    All other components within normal limits  COMPREHENSIVE METABOLIC PANEL - Abnormal; Notable for the following components:   Potassium 2.9 (*)    Glucose, Bld 112 (*)    BUN 7 (*)    Creatinine, Ser 2.81 (*)    Calcium 8.5 (*)    Albumin 2.8 (*)    AST 13 (*)    GFR, Estimated 17 (*)    All other components within normal limits  TYPE AND SCREEN  PREPARE RBC (CROSSMATCH)   EKG ED ECG REPORT I, Naaman Plummer, the attending physician, personally viewed and interpreted this ECG. Date: 10/05/2022 EKG Time: 1225 Rate: 57 Rhythm: Bradycardic sinus rhythm QRS Axis: normal Intervals: normal ST/T Wave abnormalities: normal Narrative Interpretation: Bradycardic sinus rhythm.  No evidence of acute ischemia PROCEDURES: Critical Care performed: No .1-3 Lead EKG Interpretation  Performed by: Naaman Plummer, MD Authorized by: Naaman Plummer, MD     Interpretation: normal     ECG rate:  71   ECG rate assessment: normal  Rhythm: sinus rhythm     Ectopy: none     Conduction: normal    MEDICATIONS ORDERED IN ED: Medications  0.9 %  sodium chloride infusion (has no administration in time range)   IMPRESSION / MDM / ASSESSMENT AND PLAN / ED COURSE  I reviewed the triage vital signs and the nursing notes.                             The patient is on the cardiac monitor to evaluate for evidence of arrhythmia and/or significant heart rate changes. Patient's presentation is most consistent with acute presentation with potential threat to life or bodily function. Patient presents for symptomatic anemia secondary to chronic renal disease. Patient with known cause of bleeding and follow up scheduled. Given 1 units of blood with resolution of symptoms afterwards. Patient had no reaction to blood transfusion. Patient feels well on discharge with plan to follow up with PMD.   FINAL CLINICAL IMPRESSION(S) / ED DIAGNOSES    Final diagnoses:  Symptomatic anemia  Chronic anemia   Rx / DC Orders   ED Discharge Orders     None      Note:  This document was prepared using Dragon voice recognition software and may include unintentional dictation errors.   Naaman Plummer, MD 10/05/22 [phone]

## 2022-10-05 NOTE — ED Notes (Signed)
Pt placed on bedpan at this time.

## 2022-10-05 NOTE — ED Notes (Signed)
Pt given warm blanket, and boyfriend given warm blanket. Pt informed that the blood is here and will be started as soon as possible.

## 2022-10-05 NOTE — ED Notes (Signed)
Pt given meal tray and drink. Family at bedside. Pt updated on plan of care.

## 2022-10-05 NOTE — ED Notes (Signed)
Per Dr. Cheri Fowler pt infusion rate for blood transfusion does not need to be decreased due to being a dialysis pt. Pt transfusion increased to 120 mL/hour

## 2022-10-05 NOTE — ED Notes (Signed)
Pt had large loose BM. Pt cleaned up and clean chux and brief placed. Pt given warm blankets and pulled up in bed. Also placed external cath.

## 2022-10-05 NOTE — ED Notes (Addendum)
Blood bank called to check on status of patient blood unit, blood bank stated that it had to be ordered and is not yet ready. Pt made aware. Pt states that this RN is "lying through my eyes" This RN explained to pt that I understand that she wants to go home and this is not an ideal situation, and asked pt what I can do to make her comfortable. Pt stated to "open the door and she will yell as loud as she can". This RN reoriented pt that I am here for her to make her comfortable. Pt agreed that she would like a snack, RN asking ERP for diet order.

## 2022-10-05 NOTE — ED Notes (Signed)
Pt asking about update on blood, this RN informed pt that the blood will be started as soon as possible. Pt agreeable at this time

## 2022-10-05 NOTE — ED Notes (Signed)
Report given to [facility]

## 2022-10-05 NOTE — ED Notes (Signed)
Pt daughter Lurene Mulnix (states she is DOPA) gave verbal consent for pt to receive blood transfusion. This RN and Development worker, international aid, RN heard verbal consent from [place] via phone

## 2022-10-05 NOTE — ED Notes (Signed)
Pt stuck multiple times with no success with IV. IV team order placed at this time.

## 2022-10-05 NOTE — ED Notes (Signed)
IV team at bedside 

## 2022-10-05 NOTE — ED Notes (Signed)
Pt daughter called to inform her pt wants to leave, and is unable to make choices for herself right now due to altered mental status. Pt daughter on the phone with pt Cynthia Dean).

## 2022-10-05 NOTE — ED Triage Notes (Signed)
Pt to ED via [facility] from dialysis. Pt received full dialysis tx and blood work showed that HGB was 6.5. Pt is asymptomatic.   Ems VS: BP 193/82 HR 80 97% RA CBG 130

## 2022-10-06 LAB — TYPE AND SCREEN
ABO/RH(D): A POS
Antibody Screen: POSITIVE
Unit division: 0
Unit division: 0
Unit division: 0

## 2022-10-06 LAB — BPAM RBC
Blood Product Expiration Date: 202403292359
Blood Product Expiration Date: 202404112359
Blood Product Expiration Date: 202404162359
ISSUE DATE / TIME: 202403132213
Unit Type and Rh: 6200
Unit Type and Rh: 6200
Unit Type and Rh: 6200

## 2022-10-06 MED ORDER — CYCLOBENZAPRINE HCL 10 MG PO TABS
5.0000 mg | ORAL_TABLET | Freq: Once | ORAL | Status: AC
  Administered 2022-10-06: 5 mg via ORAL
  Filled 2022-10-06: qty 1

## 2022-10-06 MED ORDER — CYCLOBENZAPRINE HCL 10 MG PO TABS: 10.0000 mg | ORAL_TABLET | Freq: Three times a day (TID) | ORAL | 0 refills | Status: DC | PRN

## 2022-10-12 ENCOUNTER — Emergency Department: Payer: 59

## 2022-10-12 ENCOUNTER — Encounter: Payer: Self-pay | Admitting: Internal Medicine

## 2022-10-12 ENCOUNTER — Inpatient Hospital Stay
Admission: EM | Admit: 2022-10-12 | Discharge: 2023-01-13 | DRG: 371 | Disposition: A | Discharge status: Hospice | Payer: 59 | Attending: Obstetrics and Gynecology | Admitting: Obstetrics and Gynecology

## 2022-10-12 ENCOUNTER — Other Ambulatory Visit: Payer: Self-pay

## 2022-10-12 DIAGNOSIS — Z515 Encounter for palliative care: Secondary | ICD-10-CM

## 2022-10-12 DIAGNOSIS — M4626 Osteomyelitis of vertebra, lumbar region: Secondary | ICD-10-CM | POA: Diagnosis present

## 2022-10-12 DIAGNOSIS — E1142 Type 2 diabetes mellitus with diabetic polyneuropathy: Secondary | ICD-10-CM | POA: Diagnosis present

## 2022-10-12 DIAGNOSIS — E440 Moderate protein-calorie malnutrition: Secondary | ICD-10-CM | POA: Diagnosis present

## 2022-10-12 DIAGNOSIS — E876 Hypokalemia: Secondary | ICD-10-CM | POA: Diagnosis not present

## 2022-10-12 DIAGNOSIS — G40909 Epilepsy, unspecified, not intractable, without status epilepticus: Secondary | ICD-10-CM | POA: Diagnosis present

## 2022-10-12 DIAGNOSIS — K6812 Psoas muscle abscess: Secondary | ICD-10-CM

## 2022-10-12 DIAGNOSIS — G9349 Other encephalopathy: Secondary | ICD-10-CM | POA: Diagnosis present

## 2022-10-12 DIAGNOSIS — Z79899 Other long term (current) drug therapy: Secondary | ICD-10-CM

## 2022-10-12 DIAGNOSIS — I120 Hypertensive chronic kidney disease with stage 5 chronic kidney disease or end stage renal disease: Secondary | ICD-10-CM | POA: Diagnosis present

## 2022-10-12 DIAGNOSIS — Z8249 Family history of ischemic heart disease and other diseases of the circulatory system: Secondary | ICD-10-CM

## 2022-10-12 DIAGNOSIS — M79605 Pain in left leg: Secondary | ICD-10-CM | POA: Diagnosis present

## 2022-10-12 DIAGNOSIS — G062 Extradural and subdural abscess, unspecified: Secondary | ICD-10-CM

## 2022-10-12 DIAGNOSIS — K219 Gastro-esophageal reflux disease without esophagitis: Secondary | ICD-10-CM | POA: Diagnosis not present

## 2022-10-12 DIAGNOSIS — R112 Nausea with vomiting, unspecified: Secondary | ICD-10-CM | POA: Diagnosis present

## 2022-10-12 DIAGNOSIS — G8929 Other chronic pain: Secondary | ICD-10-CM | POA: Diagnosis present

## 2022-10-12 DIAGNOSIS — D649 Anemia, unspecified: Secondary | ICD-10-CM | POA: Diagnosis present

## 2022-10-12 DIAGNOSIS — Z7982 Long term (current) use of aspirin: Secondary | ICD-10-CM

## 2022-10-12 DIAGNOSIS — I10 Essential (primary) hypertension: Secondary | ICD-10-CM | POA: Diagnosis not present

## 2022-10-12 DIAGNOSIS — M5136 Other intervertebral disc degeneration, lumbar region: Secondary | ICD-10-CM | POA: Diagnosis present

## 2022-10-12 DIAGNOSIS — E1122 Type 2 diabetes mellitus with diabetic chronic kidney disease: Secondary | ICD-10-CM | POA: Diagnosis present

## 2022-10-12 DIAGNOSIS — M609 Myositis, unspecified: Secondary | ICD-10-CM | POA: Insufficient documentation

## 2022-10-12 DIAGNOSIS — Z87891 Personal history of nicotine dependence: Secondary | ICD-10-CM

## 2022-10-12 DIAGNOSIS — N186 End stage renal disease: Secondary | ICD-10-CM | POA: Diagnosis present

## 2022-10-12 DIAGNOSIS — I63512 Cerebral infarction due to unspecified occlusion or stenosis of left middle cerebral artery: Secondary | ICD-10-CM | POA: Diagnosis not present

## 2022-10-12 DIAGNOSIS — M79604 Pain in right leg: Principal | ICD-10-CM | POA: Diagnosis present

## 2022-10-12 DIAGNOSIS — F0393 Unspecified dementia, unspecified severity, with mood disturbance: Secondary | ICD-10-CM | POA: Diagnosis present

## 2022-10-12 DIAGNOSIS — R4701 Aphasia: Secondary | ICD-10-CM | POA: Diagnosis not present

## 2022-10-12 DIAGNOSIS — F0394 Unspecified dementia, unspecified severity, with anxiety: Secondary | ICD-10-CM | POA: Diagnosis present

## 2022-10-12 DIAGNOSIS — R9431 Abnormal electrocardiogram [ECG] [EKG]: Secondary | ICD-10-CM | POA: Diagnosis present

## 2022-10-12 DIAGNOSIS — B957 Other staphylococcus as the cause of diseases classified elsewhere: Secondary | ICD-10-CM | POA: Diagnosis present

## 2022-10-12 DIAGNOSIS — D631 Anemia in chronic kidney disease: Secondary | ICD-10-CM | POA: Diagnosis present

## 2022-10-12 DIAGNOSIS — J449 Chronic obstructive pulmonary disease, unspecified: Secondary | ICD-10-CM | POA: Diagnosis present

## 2022-10-12 DIAGNOSIS — R410 Disorientation, unspecified: Secondary | ICD-10-CM | POA: Diagnosis not present

## 2022-10-12 DIAGNOSIS — Z86711 Personal history of pulmonary embolism: Secondary | ICD-10-CM

## 2022-10-12 DIAGNOSIS — Z66 Do not resuscitate: Secondary | ICD-10-CM | POA: Diagnosis present

## 2022-10-12 DIAGNOSIS — E119 Type 2 diabetes mellitus without complications: Secondary | ICD-10-CM

## 2022-10-12 DIAGNOSIS — Z539 Procedure and treatment not carried out, unspecified reason: Secondary | ICD-10-CM | POA: Diagnosis not present

## 2022-10-12 DIAGNOSIS — F05 Delirium due to known physiological condition: Secondary | ICD-10-CM | POA: Diagnosis not present

## 2022-10-12 DIAGNOSIS — M6008 Infective myositis, other site: Secondary | ICD-10-CM | POA: Diagnosis not present

## 2022-10-12 DIAGNOSIS — M4640 Discitis, unspecified, site unspecified: Secondary | ICD-10-CM | POA: Insufficient documentation

## 2022-10-12 DIAGNOSIS — I6389 Other cerebral infarction: Secondary | ICD-10-CM | POA: Diagnosis not present

## 2022-10-12 DIAGNOSIS — M5441 Lumbago with sciatica, right side: Secondary | ICD-10-CM | POA: Diagnosis not present

## 2022-10-12 DIAGNOSIS — Z8616 Personal history of COVID-19: Secondary | ICD-10-CM | POA: Diagnosis not present

## 2022-10-12 DIAGNOSIS — F32A Depression, unspecified: Secondary | ICD-10-CM | POA: Diagnosis present

## 2022-10-12 DIAGNOSIS — E039 Hypothyroidism, unspecified: Secondary | ICD-10-CM | POA: Diagnosis present

## 2022-10-12 DIAGNOSIS — Z91158 Patient's noncompliance with renal dialysis for other reason: Secondary | ICD-10-CM

## 2022-10-12 DIAGNOSIS — M549 Dorsalgia, unspecified: Secondary | ICD-10-CM | POA: Diagnosis present

## 2022-10-12 DIAGNOSIS — C50912 Malignant neoplasm of unspecified site of left female breast: Secondary | ICD-10-CM | POA: Diagnosis present

## 2022-10-12 DIAGNOSIS — E1169 Type 2 diabetes mellitus with other specified complication: Secondary | ICD-10-CM | POA: Diagnosis not present

## 2022-10-12 DIAGNOSIS — E11319 Type 2 diabetes mellitus with unspecified diabetic retinopathy without macular edema: Secondary | ICD-10-CM | POA: Diagnosis present

## 2022-10-12 DIAGNOSIS — N2581 Secondary hyperparathyroidism of renal origin: Secondary | ICD-10-CM | POA: Diagnosis present

## 2022-10-12 DIAGNOSIS — R52 Pain, unspecified: Secondary | ICD-10-CM | POA: Diagnosis not present

## 2022-10-12 DIAGNOSIS — I953 Hypotension of hemodialysis: Secondary | ICD-10-CM | POA: Diagnosis not present

## 2022-10-12 DIAGNOSIS — I38 Endocarditis, valve unspecified: Secondary | ICD-10-CM | POA: Diagnosis not present

## 2022-10-12 DIAGNOSIS — Z992 Dependence on renal dialysis: Secondary | ICD-10-CM | POA: Diagnosis not present

## 2022-10-12 DIAGNOSIS — Z794 Long term (current) use of insulin: Secondary | ICD-10-CM | POA: Diagnosis not present

## 2022-10-12 DIAGNOSIS — M4646 Discitis, unspecified, lumbar region: Secondary | ICD-10-CM | POA: Insufficient documentation

## 2022-10-12 DIAGNOSIS — M1711 Unilateral primary osteoarthritis, right knee: Secondary | ICD-10-CM | POA: Diagnosis present

## 2022-10-12 DIAGNOSIS — Z7951 Long term (current) use of inhaled steroids: Secondary | ICD-10-CM

## 2022-10-12 DIAGNOSIS — Z885 Allergy status to narcotic agent status: Secondary | ICD-10-CM

## 2022-10-12 DIAGNOSIS — Z803 Family history of malignant neoplasm of breast: Secondary | ICD-10-CM

## 2022-10-12 DIAGNOSIS — R29717 NIHSS score 17: Secondary | ICD-10-CM | POA: Diagnosis not present

## 2022-10-12 DIAGNOSIS — T82868A Thrombosis of vascular prosthetic devices, implants and grafts, initial encounter: Secondary | ICD-10-CM | POA: Diagnosis not present

## 2022-10-12 DIAGNOSIS — E663 Overweight: Secondary | ICD-10-CM | POA: Diagnosis present

## 2022-10-12 DIAGNOSIS — E785 Hyperlipidemia, unspecified: Secondary | ICD-10-CM | POA: Diagnosis not present

## 2022-10-12 DIAGNOSIS — Z7401 Bed confinement status: Secondary | ICD-10-CM

## 2022-10-12 DIAGNOSIS — Z823 Family history of stroke: Secondary | ICD-10-CM

## 2022-10-12 DIAGNOSIS — M869 Osteomyelitis, unspecified: Secondary | ICD-10-CM | POA: Diagnosis not present

## 2022-10-12 DIAGNOSIS — Z8673 Personal history of transient ischemic attack (TIA), and cerebral infarction without residual deficits: Secondary | ICD-10-CM

## 2022-10-12 DIAGNOSIS — I639 Cerebral infarction, unspecified: Secondary | ICD-10-CM | POA: Diagnosis not present

## 2022-10-12 DIAGNOSIS — M545 Low back pain, unspecified: Secondary | ICD-10-CM | POA: Diagnosis not present

## 2022-10-12 DIAGNOSIS — Z888 Allergy status to other drugs, medicaments and biological substances status: Secondary | ICD-10-CM

## 2022-10-12 DIAGNOSIS — D638 Anemia in other chronic diseases classified elsewhere: Secondary | ICD-10-CM | POA: Diagnosis not present

## 2022-10-12 DIAGNOSIS — Z7189 Other specified counseling: Secondary | ICD-10-CM | POA: Diagnosis not present

## 2022-10-12 DIAGNOSIS — Z9889 Other specified postprocedural states: Secondary | ICD-10-CM | POA: Diagnosis not present

## 2022-10-12 DIAGNOSIS — M4620 Osteomyelitis of vertebra, site unspecified: Secondary | ICD-10-CM | POA: Diagnosis not present

## 2022-10-12 DIAGNOSIS — Z6828 Body mass index (BMI) 28.0-28.9, adult: Secondary | ICD-10-CM

## 2022-10-12 DIAGNOSIS — Z91048 Other nonmedicinal substance allergy status: Secondary | ICD-10-CM

## 2022-10-12 DIAGNOSIS — I2510 Atherosclerotic heart disease of native coronary artery without angina pectoris: Secondary | ICD-10-CM | POA: Diagnosis present

## 2022-10-12 DIAGNOSIS — M5442 Lumbago with sciatica, left side: Secondary | ICD-10-CM | POA: Diagnosis not present

## 2022-10-12 DIAGNOSIS — E1140 Type 2 diabetes mellitus with diabetic neuropathy, unspecified: Secondary | ICD-10-CM | POA: Diagnosis present

## 2022-10-12 LAB — BASIC METABOLIC PANEL
Anion gap: 9 (ref 5–15)
BUN: 24 mg/dL — ABNORMAL HIGH (ref 8–23)
CO2: 24 mmol/L (ref 22–32)
Calcium: 9.1 mg/dL (ref 8.9–10.3)
Chloride: 105 mmol/L (ref 98–111)
Creatinine, Ser: 6.68 mg/dL — ABNORMAL HIGH (ref 0.44–1.00)
GFR, Estimated: 6 mL/min — ABNORMAL LOW (ref >60)
Glucose, Bld: 109 mg/dL — ABNORMAL HIGH (ref 70–99)
Potassium: 3.1 mmol/L — ABNORMAL LOW (ref 3.5–5.1)
Sodium: 138 mmol/L (ref 135–145)

## 2022-10-12 LAB — CBC WITH DIFFERENTIAL/PLATELET
Abs Immature Granulocytes: 0.09 10*3/uL — ABNORMAL HIGH (ref 0.00–0.07)
Basophils Absolute: 0.1 10*3/uL (ref 0.0–0.1)
Basophils Relative: 1 %
Eosinophils Absolute: 0.1 10*3/uL (ref 0.0–0.5)
Eosinophils Relative: 1 %
HCT: 35 % — ABNORMAL LOW (ref 36.0–46.0)
Hemoglobin: 9.6 g/dL — ABNORMAL LOW (ref 12.0–15.0)
Immature Granulocytes: 1 %
Lymphocytes Relative: 13 %
Lymphs Abs: 1.4 10*3/uL (ref 0.7–4.0)
MCH: 23.6 pg — ABNORMAL LOW (ref 26.0–34.0)
MCHC: 27.4 g/dL — ABNORMAL LOW (ref 30.0–36.0)
MCV: 86 fL (ref 80.0–100.0)
Monocytes Absolute: 0.9 10*3/uL (ref 0.1–1.0)
Monocytes Relative: 9 %
Neutro Abs: 7.6 10*3/uL (ref 1.7–7.7)
Neutrophils Relative %: 75 %
Platelets: 199 10*3/uL (ref 150–400)
RBC: 4.07 MIL/uL (ref 3.87–5.11)
RDW: 21.2 % — ABNORMAL HIGH (ref 11.5–15.5)
Smear Review: NORMAL
WBC: 10.2 10*3/uL (ref 4.0–10.5)
nRBC: 0 % (ref 0.0–0.2)

## 2022-10-12 LAB — LACTIC ACID, PLASMA: Lactic Acid, Venous: 1.5 mmol/L (ref 0.5–1.9)

## 2022-10-12 LAB — C-REACTIVE PROTEIN: CRP: 5.5 mg/dL — ABNORMAL HIGH (ref <1.0)

## 2022-10-12 LAB — HEPATITIS B SURFACE ANTIGEN: Hepatitis B Surface Ag: NONREACTIVE

## 2022-10-12 LAB — GLUCOSE, CAPILLARY: Glucose-Capillary: 92 mg/dL (ref 70–99)

## 2022-10-12 MED ORDER — LIDOCAINE HCL (PF) 1 % IJ SOLN: 5.0000 mL | INTRAMUSCULAR | Status: DC | PRN

## 2022-10-12 MED ORDER — GABAPENTIN 100 MG PO CAPS
100.0000 mg | ORAL_CAPSULE | Freq: Two times a day (BID) | ORAL | Status: DC
  Administered 2022-10-12 – 2022-10-20 (×13): 100 mg via ORAL
  Filled 2022-10-12 (×15): qty 1

## 2022-10-12 MED ORDER — MORPHINE SULFATE (PF) 2 MG/ML IV SOLN
2.0000 mg | INTRAVENOUS | Status: DC | PRN
  Administered 2022-10-12 – 2022-10-16 (×6): 2 mg via INTRAVENOUS
  Filled 2022-10-12 (×7): qty 1

## 2022-10-12 MED ORDER — SODIUM CHLORIDE 0.9 % IV SOLN: 1.0000 g | Freq: Once | INTRAVENOUS | Status: DC

## 2022-10-12 MED ORDER — ACETAMINOPHEN 500 MG PO TABS
1000.0000 mg | ORAL_TABLET | Freq: Once | ORAL | Status: AC
  Administered 2022-10-12: 1000 mg via ORAL
  Filled 2022-10-12: qty 2

## 2022-10-12 MED ORDER — VANCOMYCIN HCL IN DEXTROSE 1-5 GM/200ML-% IV SOLN: 1000.0000 mg | Freq: Once | INTRAVENOUS | Status: DC

## 2022-10-12 MED ORDER — METOPROLOL SUCCINATE ER 50 MG PO TB24
100.0000 mg | ORAL_TABLET | Freq: Every day | ORAL | Status: DC
  Administered 2022-10-12 – 2022-10-19 (×6): 100 mg via ORAL
  Filled 2022-10-12 (×6): qty 2

## 2022-10-12 MED ORDER — PENTAFLUOROPROP-TETRAFLUOROETH EX AERO: 1.0000 | INHALATION_SPRAY | CUTANEOUS | Status: DC | PRN

## 2022-10-12 MED ORDER — CHLORHEXIDINE GLUCONATE CLOTH 2 % EX PADS
6.0000 | MEDICATED_PAD | Freq: Every day | CUTANEOUS | Status: DC
  Administered 2022-10-12 – 2022-10-18 (×3): 6 via TOPICAL
  Filled 2022-10-12: qty 6

## 2022-10-12 MED ORDER — LEVETIRACETAM 250 MG PO TABS
250.0000 mg | ORAL_TABLET | ORAL | Status: DC
  Administered 2022-10-12 – 2023-01-09 (×35): 250 mg via ORAL
  Filled 2022-10-12 (×42): qty 1

## 2022-10-12 MED ORDER — ACETAMINOPHEN 650 MG RE SUPP: 650.0000 mg | Freq: Four times a day (QID) | RECTAL | Status: DC | PRN

## 2022-10-12 MED ORDER — VANCOMYCIN HCL 1500 MG/300ML IV SOLN: 1500.0000 mg | Freq: Once | INTRAVENOUS | Status: DC

## 2022-10-12 MED ORDER — LIDOCAINE-PRILOCAINE 2.5-2.5 % EX CREA: 1.0000 | TOPICAL_CREAM | CUTANEOUS | Status: DC | PRN

## 2022-10-12 MED ORDER — SENNOSIDES-DOCUSATE SODIUM 8.6-50 MG PO TABS
1.0000 | ORAL_TABLET | Freq: Every evening | ORAL | Status: DC | PRN
  Administered 2022-11-30 – 2022-12-13 (×3): 1 via ORAL
  Filled 2022-10-12 (×3): qty 1

## 2022-10-12 MED ORDER — ACETAMINOPHEN 325 MG PO TABS
650.0000 mg | ORAL_TABLET | Freq: Four times a day (QID) | ORAL | Status: DC | PRN
  Administered 2022-10-12 – 2022-10-19 (×2): 650 mg via ORAL
  Filled 2022-10-12 (×4): qty 2

## 2022-10-12 MED ORDER — PENTAFLUOROPROP-TETRAFLUOROETH EX AERO
INHALATION_SPRAY | CUTANEOUS | Status: AC
  Filled 2022-10-12: qty 30

## 2022-10-12 MED ORDER — PANTOPRAZOLE SODIUM 20 MG PO TBEC
20.0000 mg | DELAYED_RELEASE_TABLET | Freq: Two times a day (BID) | ORAL | Status: DC
  Administered 2022-10-12 – 2022-12-24 (×133): 20 mg via ORAL
  Filled 2022-10-12 (×148): qty 1

## 2022-10-12 MED ORDER — UMECLIDINIUM-VILANTEROL 62.5-25 MCG/ACT IN AEPB
1.0000 | INHALATION_SPRAY | Freq: Every day | RESPIRATORY_TRACT | Status: DC
  Administered 2022-10-13 – 2023-01-08 (×55): 1 via RESPIRATORY_TRACT
  Filled 2022-10-12 (×7): qty 14

## 2022-10-12 MED ORDER — SERTRALINE HCL 50 MG PO TABS
25.0000 mg | ORAL_TABLET | Freq: Every day | ORAL | Status: DC
  Administered 2022-10-12 – 2023-01-12 (×78): 25 mg via ORAL
  Filled 2022-10-12 (×87): qty 1

## 2022-10-12 MED ORDER — ATORVASTATIN CALCIUM 20 MG PO TABS
80.0000 mg | ORAL_TABLET | Freq: Every day | ORAL | Status: DC
  Administered 2022-10-12 – 2022-10-31 (×18): 80 mg via ORAL
  Filled 2022-10-12 (×20): qty 4

## 2022-10-12 MED ORDER — MORPHINE SULFATE (PF) 2 MG/ML IV SOLN
INTRAVENOUS | Status: AC
  Administered 2022-10-12: 2 mg via INTRAVENOUS
  Filled 2022-10-12: qty 1

## 2022-10-12 MED ORDER — INSULIN ASPART 100 UNIT/ML IJ SOLN
0.0000 [IU] | Freq: Three times a day (TID) | INTRAMUSCULAR | Status: DC
  Administered 2022-10-15: 1 [IU] via SUBCUTANEOUS
  Administered 2022-10-15 – 2022-10-16 (×2): 2 [IU] via SUBCUTANEOUS
  Administered 2022-10-16 – 2022-10-19 (×7): 1 [IU] via SUBCUTANEOUS
  Filled 2022-10-12 (×11): qty 1

## 2022-10-12 MED ORDER — OXYCODONE HCL 5 MG PO TABS
5.0000 mg | ORAL_TABLET | Freq: Once | ORAL | Status: AC
  Administered 2022-10-12: 5 mg via ORAL
  Filled 2022-10-12: qty 1

## 2022-10-12 MED ORDER — SODIUM CHLORIDE 0.9% FLUSH
3.0000 mL | Freq: Two times a day (BID) | INTRAVENOUS | Status: DC
  Administered 2022-10-12: 3 mL via INTRAVENOUS

## 2022-10-12 MED ORDER — SODIUM CHLORIDE 0.9 % IV SOLN
1.0000 g | INTRAVENOUS | Status: DC
  Filled 2022-10-12: qty 10

## 2022-10-12 MED ORDER — ALTEPLASE 2 MG IJ SOLR
2.0000 mg | Freq: Once | INTRAMUSCULAR | Status: AC | PRN
  Administered 2022-12-12: 2 mg

## 2022-10-12 MED ORDER — HYDRALAZINE HCL 20 MG/ML IJ SOLN: 20.0000 mg | Freq: Four times a day (QID) | INTRAMUSCULAR | Status: DC | PRN

## 2022-10-12 MED ORDER — GADOBUTROL 1 MMOL/ML IV SOLN
10.0000 mL | Freq: Once | INTRAVENOUS | Status: AC | PRN
  Administered 2022-10-12: 10 mL via INTRAVENOUS

## 2022-10-12 MED ORDER — HEPARIN SODIUM (PORCINE) 1000 UNIT/ML DIALYSIS: 1000.0000 [IU] | INTRAMUSCULAR | Status: DC | PRN

## 2022-10-12 MED ORDER — TRAMADOL HCL 50 MG PO TABS
50.0000 mg | ORAL_TABLET | Freq: Four times a day (QID) | ORAL | Status: DC | PRN
  Administered 2022-10-12 – 2022-10-15 (×4): 50 mg via ORAL
  Filled 2022-10-12 (×4): qty 1

## 2022-10-12 MED ORDER — LOSARTAN POTASSIUM 50 MG PO TABS
100.0000 mg | ORAL_TABLET | Freq: Every day | ORAL | Status: DC
  Administered 2022-10-12 – 2022-10-19 (×6): 100 mg via ORAL
  Filled 2022-10-12 (×6): qty 2

## 2022-10-12 MED ORDER — LEVETIRACETAM 500 MG PO TABS
1000.0000 mg | ORAL_TABLET | Freq: Every day | ORAL | Status: DC
  Administered 2022-10-12 – 2023-01-12 (×83): 1000 mg via ORAL
  Filled 2022-10-12 (×93): qty 2

## 2022-10-12 MED ORDER — ANTICOAGULANT SODIUM CITRATE 4% (200MG/5ML) IV SOLN: 5.0000 mL | Status: DC | PRN

## 2022-10-12 NOTE — Progress Notes (Signed)
Spoke to Dr. Juleen China and orders were placed for HD today. ED RN also called to verify that patient is coming to [facility] today for her hemodialysis.

## 2022-10-12 NOTE — H&P (Addendum)
History and Physical    Cynthia Dean [id number] DOB: 30-Sep-1950 DOA: 10/12/2022  PCP: Lowella Bandy, MD  Patient coming from: home     Chief Complaint: back pain  HPI:  72 y/o F w/ PMH of ESRD on HD, HLD, COPD, DM2, HTN, depression, CVA who presents w/ back pain x 3 weeks. Hx was obtained from pt and pt's daughter. Pt is a poor historian. The back pain has become progressively worse. The pain is located in the lower back that is stabbing, intermittent w/ radiation down both legs. Pain meds helps with the pain and nothing makes the pain worse. The severity is currently 7/10. Pt denies any recents falls and/or trauma. Pt uses a walker.  Pt also c/o subjective fever. Pt denies any sweating, chest pain, cough, shortness of breath, nausea, vomiting, abd pain, urinary urgency, urinary frequency, diarrhea or constipation.    Review of Systems: As per HPI otherwise 14 point review of systems negative.    Past Medical History:  Diagnosis Date   Acute on chronic respiratory failure with hypoxia ([place]) 07/30/2019   Acute respiratory failure with hypoxia ([place]) 12/31/2018   Carotid arterial disease ([place])    a. 02/2018 < bilat ICA stenosis.   COPD (chronic obstructive pulmonary disease) (HCC)    Coronary artery disease    a. 11/2015 MV: EF 57%, no ischemia/infarct.   Cryptogenic stroke ([place])    a. 10/2017 s/p implantable loop recorder (no Afib to date).   Diabetes mellitus without complication ([place])    Diarrhea    ESRD on dialysis [facility])    GI bleed    a. 01/2019 EGD/Colonoscopy: Mild gastritis. Colitis.   Heart murmur    a. 12/2018 Echo: EF 55-60%, impaired relaxation. Mod dil LA. Mildly dil RA. Mild Ca2+ of AoV.   Hematemesis 06/27/2017   History of 2019 novel coronavirus disease (COVID-19) 07/30/2019   Hyperkalemia    Hyperlipidemia    Hypertension    Intractable nausea and vomiting 08/08/2015   Nausea & vomiting 05/30/2018   Nausea and vomiting 05/29/2018   Pneumonia due to  COVID-19 virus     Past Surgical History:  Procedure Laterality Date   A/V FISTULAGRAM Left 12/20/2016   Procedure: A/V Fistulagram;  Surgeon: Katha Cabal, MD;  Location: [place] [facility];  Service: Cardiovascular;  Laterality: Left;   A/V FISTULAGRAM Left 09/22/2022   Procedure: A/V Fistulagram;  Surgeon: Algernon Huxley, MD;  Location: [place] [facility];  Service: Cardiovascular;  Laterality: Left;   A/V SHUNT INTERVENTION N/A 12/20/2016   Procedure: A/V Shunt Intervention;  Surgeon: Katha Cabal, MD;  Location: [place] [facility];  Service: Cardiovascular;  Laterality: N/A;   A/V SHUNTOGRAM Left 09/11/2017   Procedure: A/V SHUNTOGRAM;  Surgeon: Algernon Huxley, MD;  Location: [place] [facility];  Service: Cardiovascular;  Laterality: Left;   A/V SHUNTOGRAM Left 11/21/2019   Procedure: A/V SHUNTOGRAM;  Surgeon: Algernon Huxley, MD;  Location: [place] [facility];  Service: Cardiovascular;  Laterality: Left;   BREAST BIOPSY Bilateral 07/19/2000   neg   BREAST BIOPSY Left 11/03/2021   [place] bx mass at 3:00, venus marker, axilla bx-hyrdo marker, path pending   COLONOSCOPY WITH PROPOFOL N/A 02/16/2019   Procedure: COLONOSCOPY WITH PROPOFOL;  Surgeon: Toledo, Benay Pike, MD;  Location: [facility] ENDOSCOPY;  Service: Gastroenterology;  Laterality: N/A;   COLONOSCOPY WITH PROPOFOL N/A 08/31/2022   Procedure: COLONOSCOPY WITH PROPOFOL;  Surgeon: Annamaria Helling, DO;  Location: [facility]  ENDOSCOPY;  Service: Gastroenterology;  Laterality: N/A;   ESOPHAGOGASTRODUODENOSCOPY (EGD) WITH PROPOFOL N/A 02/16/2019   Procedure: ESOPHAGOGASTRODUODENOSCOPY (EGD) WITH PROPOFOL;  Surgeon: Toledo, Benay Pike, MD;  Location: [facility] ENDOSCOPY;  Service: Gastroenterology;  Laterality: N/A;   ESOPHAGOGASTRODUODENOSCOPY (EGD) WITH PROPOFOL N/A 08/28/2022   Procedure: ESOPHAGOGASTRODUODENOSCOPY (EGD) WITH PROPOFOL;  Surgeon: Annamaria Helling, DO;  Location: [place];  Service: Gastroenterology;   Laterality: N/A;   IR US GUIDE BX ASP/DRAIN  03/25/2022   LOOP RECORDER INSERTION N/A 11/16/2017   Procedure: LOOP RECORDER INSERTION;  Surgeon: Deboraha Sprang, MD;  Location: [place] [facility];  Service: Cardiovascular;  Laterality: N/A;   PERIPHERAL VASCULAR CATHETERIZATION Left 02/02/2015   Procedure: A/V Shuntogram/Fistulagram;  Surgeon: Algernon Huxley, MD;  Location: [place] [facility];  Service: Cardiovascular;  Laterality: Left;   PERIPHERAL VASCULAR CATHETERIZATION Left 02/02/2015   Procedure: A/V Shunt Intervention;  Surgeon: Algernon Huxley, MD;  Location: [place] [facility];  Service: Cardiovascular;  Laterality: Left;   PERIPHERAL VASCULAR CATHETERIZATION Left 03/09/2015   Procedure: A/V Shuntogram/Fistulagram;  Surgeon: Algernon Huxley, MD;  Location: [place] [facility];  Service: Cardiovascular;  Laterality: Left;   PERIPHERAL VASCULAR CATHETERIZATION N/A 03/09/2015   Procedure: A/V Shunt Intervention;  Surgeon: Algernon Huxley, MD;  Location: [place] [facility];  Service: Cardiovascular;  Laterality: N/A;   PERIPHERAL VASCULAR CATHETERIZATION Left 04/17/2015   Procedure: Upper Extremity Angiography;  Surgeon: Algernon Huxley, MD;  Location: [place] [facility];  Service: Cardiovascular;  Laterality: Left;   PERIPHERAL VASCULAR CATHETERIZATION  04/17/2015   Procedure: Upper Extremity Intervention;  Surgeon: Algernon Huxley, MD;  Location: [place] [facility];  Service: Cardiovascular;;   PERIPHERAL VASCULAR CATHETERIZATION N/A 08/10/2015   Procedure: A/V Shuntogram/Fistulagram;  Surgeon: Algernon Huxley, MD;  Location: [place] [facility];  Service: Cardiovascular;  Laterality: N/A;   PERIPHERAL VASCULAR CATHETERIZATION N/A 08/10/2015   Procedure: A/V Shunt Intervention;  Surgeon: Algernon Huxley, MD;  Location: [place] [facility];  Service: Cardiovascular;  Laterality: N/A;   PERIPHERAL VASCULAR CATHETERIZATION Left 04/11/2016   Procedure: A/V Shuntogram/Fistulagram;  Surgeon: Algernon Huxley, MD;   Location: [place] [facility];  Service: Cardiovascular;  Laterality: Left;   TEE WITHOUT CARDIOVERSION N/A 11/16/2017   Procedure: TRANSESOPHAGEAL ECHOCARDIOGRAM (TEE);  Surgeon: Minna Merritts, MD;  Location: [facility];  Service: Cardiovascular;  Laterality: N/A;     reports that she has quit smoking. Her smoking use included cigarettes. She has never used smokeless tobacco. She reports that she does not drink alcohol and does not use drugs.  Allergies  Allergen Reactions   Oxycodone Nausea And Vomiting   Oxycodone-Acetaminophen Other (See Comments)   Wound Dressing Adhesive    Hydrocodone     Intolerant more than allergic   Tape Itching    Skin Dermatitis/itching (tape adhesive) Skin Dermatitis/itching (tape adhesive)   Tapentadol Itching    Skin Dermatitis/itching (tape adhesive) Skin Dermatitis/itching (tape adhesive)     Family History  Problem Relation Age of Onset   Breast cancer Father 30   Stroke Mother    Heart attack Mother    Heart Problems Sister    Breast cancer Cousin 40       1 st cousin. Maternal      Prior to Admission medications   Medication Sig Start Date End Date Taking? Authorizing Provider  acetaminophen (TYLENOL) 500 MG tablet Take 1 tablet (500 mg total) by mouth every 6 (six) hours as  needed. 07/01/22   Naaman Plummer, MD  aspirin EC 81 MG tablet Take 1 tablet (81 mg total) by mouth daily. Swallow whole. 07/01/22   Naaman Plummer, MD  atorvastatin (LIPITOR) 80 MG tablet Take 1 tablet (80 mg total) by mouth every evening. 07/01/22 08/26/22  Naaman Plummer, MD  cinacalcet (SENSIPAR) 30 MG tablet Take 1 tablet (30 mg total) by mouth daily with breakfast. 07/01/22   Naaman Plummer, MD  cyclobenzaprine (FLEXERIL) 10 MG tablet Take 1 tablet (10 mg total) by mouth 3 (three) times daily as needed for muscle spasms. 10/06/22   Naaman Plummer, MD  diphenoxylate-atropine (LOMOTIL) 2.5-0.025 MG tablet SMARTSIG:1 Tablet(s) By Mouth Every 12 Hours PRN 07/01/22    Naaman Plummer, MD  epoetin alfa (EPOGEN) 10000 UNIT/ML injection Inject 1 mL (10,000 Units total) into the vein every Monday, Wednesday, and Friday with hemodialysis. 09/07/22   Emeterio Reeve, DO  fluticasone [facility]) 50 MCG/ACT nasal spray Place 1 spray into both nostrils daily. 07/01/22 08/26/22  Naaman Plummer, MD  gabapentin (NEURONTIN) 100 MG capsule Take 1 capsule (100 mg total) by mouth 2 (two) times daily. Additional capsule as needed once daily for pain, total maximum daily dose 3 capsules (300 mg) 09/03/22   Emeterio Reeve, DO  glucose 4 GM chewable tablet Chew 1 tablet (4 g total) by mouth as needed for low blood sugar. 07/01/22   Naaman Plummer, MD  insulin NPH-regular Human (NOVOLIN 70/30) (70-30) 100 UNIT/ML injection Inject 28 Units into the skin 2 (two) times daily with a meal. 07/01/22   Bradler, Vista Lawman, MD  levETIRAcetam (KEPPRA) 1000 MG tablet Take 1 tablet (1,000 mg total) by mouth daily. 07/01/22   Naaman Plummer, MD  levETIRAcetam (KEPPRA) 250 MG tablet Take 1 tablet (250 mg total) by mouth 3 (three) times a week. 07/01/22 08/26/22  Naaman Plummer, MD  lidocaine (LIDODERM) 5 % Place 1 patch onto the skin daily. Remove & Discard patch within 12 hours or as directed by MD 07/01/22   Naaman Plummer, MD  losartan (COZAAR) 100 MG tablet Take 1 tablet (100 mg total) by mouth at bedtime. 07/01/22 07/31/22  Naaman Plummer, MD  metoprolol succinate (TOPROL-XL) 100 MG 24 hr tablet Take 1 tablet (100 mg total) by mouth daily. 07/01/22 08/26/22  Naaman Plummer, MD  omeprazole (PRILOSEC) 20 MG capsule Take 1 capsule (20 mg total) by mouth 2 (two) times daily. 07/01/22 08/26/22  Naaman Plummer, MD  polyethylene glycol (MIRALAX / GLYCOLAX) 17 g packet Take 17 g by mouth daily. 09/06/22   Emeterio Reeve, DO  senna-docusate (SENOKOT-S) 8.6-50 MG tablet Take 2 tablets by mouth at bedtime as needed for mild constipation or moderate constipation. 09/06/22   Emeterio Reeve, DO  sertraline  (ZOLOFT) 25 MG tablet Take 1 tablet (25 mg total) by mouth daily. 07/01/22 07/31/22  Naaman Plummer, MD  torsemide (DEMADEX) 100 MG tablet Take 1 tablet (100 mg total) by mouth daily. 07/01/22 08/26/22  Naaman Plummer, MD  traZODone (DESYREL) 50 MG tablet Take 1 tablet (50 mg total) by mouth at bedtime. 07/01/22 08/26/22  Naaman Plummer, MD  umeclidinium-vilanterol (ANORO ELLIPTA) 62.5-25 MCG/ACT AEPB Inhale 1 puff into the lungs daily. 09/06/22   Emeterio Reeve, DO  vitamin D3 (CHOLECALCIFEROL) 25 MCG tablet Take 1 tablet (1,000 Units total) by mouth daily. 07/01/22   Naaman Plummer, MD    Physical Exam: Vitals:   10/12/22 1430 10/12/22  1440 10/12/22 1500 10/12/22 1700  BP: (!) 160/74 (!) 166/73 (!) 157/65 (!) 165/65  Pulse: 66 64 66 65  Resp: 16 14 14 14   Temp:  98.3 F (36.8 C)  98.1 F (36.7 C)  TempSrc:  Oral  Oral  SpO2: 98% 99% 100% 98%  Weight:  94.4 kg    Height:        Constitutional: NAD, calm but uncomfortable. Lethargic  Vitals:   10/12/22 1430 10/12/22 1440 10/12/22 1500 10/12/22 1700  BP: (!) 160/74 (!) 166/73 (!) 157/65 (!) 165/65  Pulse: 66 64 66 65  Resp: 16 14 14 14   Temp:  98.3 F (36.8 C)  98.1 F (36.7 C)  TempSrc:  Oral  Oral  SpO2: 98% 99% 100% 98%  Weight:  94.4 kg    Height:       Eyes: PERRL, lids and conjunctivae normal ENMT: Mucous membranes are moist  Neck: normal, supple Respiratory: decreased breath sounds b/l. No accessory muscle use.  Cardiovascular: Regular rate and rhythm, no rubs / gallops.  Abdomen: soft, obese, no tenderness. Hypoactive bowel sounds  Musculoskeletal: no clubbing / cyanosis. No joint deformity upper and lower extremities. Normal muscle tone.  Skin: no rashes, lesions. Neurologic: CN 2-12 grossly intact. Decreased strength of b/l LE Psychiatric: Oriented to person, place only. Flat mood and affect    Labs on Admission: I have personally reviewed following labs and imaging studies  CBC: Recent Labs  Lab  10/12/22 0605  WBC 10.2  NEUTROABS 7.6  HGB 9.6*  HCT 35.0*  MCV 86.0  PLT [id number]   Basic Metabolic Panel: Recent Labs  Lab 10/12/22 0605  NA 138  K 3.1*  CL 105  CO2 24  GLUCOSE 109*  BUN 24*  CREATININE 6.68*  CALCIUM 9.1   GFR: Estimated Creatinine Clearance: 10 mL/min (A) (by C-G formula based on SCr of 6.68 mg/dL (H)). Liver Function Tests: No results for input(s): "AST", "ALT", "ALKPHOS", "BILITOT", "PROT", "ALBUMIN" in the last 168 hours.  No results for input(s): "LIPASE", "AMYLASE" in the last 168 hours. No results for input(s): "AMMONIA" in the last 168 hours. Coagulation Profile: No results for input(s): "INR", "PROTIME" in the last 168 hours. Cardiac Enzymes: No results for input(s): "CKTOTAL", "CKMB", "CKMBINDEX", "TROPONINI" in the last 168 hours. BNP (last 3 results) No results for input(s): "PROBNP" in the last 8760 hours. HbA1C: No results for input(s): "HGBA1C" in the last 72 hours. CBG: No results for input(s): "GLUCAP" in the last 168 hours. Lipid Profile: No results for input(s): "CHOL", "HDL", "LDLCALC", "TRIG", "CHOLHDL", "LDLDIRECT" in the last 72 hours. Thyroid Function Tests: No results for input(s): "TSH", "T4TOTAL", "FREET4", "T3FREE", "THYROIDAB" in the last 72 hours. Anemia Panel: No results for input(s): "VITAMINB12", "FOLATE", "FERRITIN", "TIBC", "IRON", "RETICCTPCT" in the last 72 hours. Urine analysis:    Component Value Date/Time   COLORURINE YELLOW (A) 03/20/2022 0125   APPEARANCEUR CLEAR (A) 03/20/2022 0125   APPEARANCEUR Clear 06/14/2014 1510   LABSPEC 1.010 03/20/2022 0125   LABSPEC 1.006 06/14/2014 1510   PHURINE 7.0 03/20/2022 0125   GLUCOSEU 50 (A) 03/20/2022 0125   GLUCOSEU >=500 06/14/2014 1510   HGBUR MODERATE (A) 03/20/2022 0125   BILIRUBINUR NEGATIVE 03/20/2022 0125   BILIRUBINUR Negative 06/14/2014 1510   KETONESUR NEGATIVE 03/20/2022 0125   PROTEINUR 100 (A) 03/20/2022 0125   NITRITE NEGATIVE 03/20/2022 0125    LEUKOCYTESUR NEGATIVE 03/20/2022 0125   LEUKOCYTESUR Negative 06/14/2014 1510    Radiological Exams on Admission: MR Lumbar  Spine W Wo Contrast  Result Date: 10/12/2022 CLINICAL DATA:  Bilateral leg pain for 3 weeks. EXAM: MRI LUMBAR SPINE WITHOUT AND WITH CONTRAST TECHNIQUE: Multiplanar and multiecho pulse sequences of the lumbar spine were obtained without and with intravenous contrast. CONTRAST:  44mL GADAVIST GADOBUTROL 1 MMOL/ML IV SOLN COMPARISON:  CT abdomen/pelvis 06/27/2022, lumbar spine MRI 05/11/2022 FINDINGS: Segmentation: Standard; the lowest formed disc space is designated L5-S1. Alignment:  Normal. Vertebrae: Vertebral body heights are preserved. Background marrow signal is normal There is abnormal T1 hypointensity with edema and postcontrast enhancement of the L4 and L5 vertebral bodies along the disc space. There is fluid within the disc space with abnormal enhancement at the periphery of the disc bilaterally suspicious for intradiscal abscess. Findings concerning for discitis/osteomyelitis. There is circumferential enhancing material within the spinal canal at L4 extending into the sacrum likely reflecting epidural phlegmon, thickest along the L5 vertebral body where it measures up to 5 mm there is no defined abscess. There is STIR signal abnormality and enhancement about the bilateral facet joints at this level suspicious for septic arthritis, more convincing on the left (13-28, 29, 12-13, 7). There is no other abnormal marrow edema or enhancement. Conus medullaris and cauda equina: Conus extends to the L1-L2 level. Conus and cauda equina appear normal. There is no abnormal enhancement of the conus or cauda equina nerve roots. Paraspinal and other soft tissues: There is a edema and enhancement in the bilateral psoas muscles consistent with infectious myositis. There is a multiloculated abscess in the right psoas muscle measuring up to approximately 1.7 cm AP x 1.4 cm TV by 2.5 cm cc  (13-28). On the left, there is smaller abscess measuring 0.9 cm x 0.8 cm by 1.0 cm (13-26, 12-15). Bilateral renal cysts are noted. Disc levels: There is disc desiccation and narrowing most advanced at L5-S1 with overall mild disc degeneration at the other levels. T12-L1: No significant spinal canal or neural foraminal stenosis. L1-L2: No significant spinal canal or neural foraminal stenosis. L2-L3: There is a diffuse disc bulge and left worse than right facet arthropathy with 6 mm nodular T2 hypointense lesion projecting medially from the left facet joint favored to reflect a complex synovial cyst. Findings again resulting in mild spinal canal stenosis with left subarticular zone narrowing and mild bilateral neural foraminal stenosis, unchanged. L3-L4: There is a mild disc bulge and moderate bilateral facet arthropathy with ligamentum flavum thickening resulting in moderate bilateral neural foraminal stenosis without significant spinal canal stenosis, unchanged. L4-L5: There is a disc bulge and moderate bilateral facet arthropathy superimposed on acute infectious findings detailed above resulting in mild-to-moderate spinal canal stenosis with bilateral subarticular zone narrowing and moderate bilateral neural foraminal stenosis. L5-S1: There is a diffuse disc bulge with a central annular fissure and mild bilateral facet arthropathy resulting in bilateral subarticular zone narrowing and moderate bilateral neural foraminal stenosis. IMPRESSION: 1. Findings concerning for discitis/osteomyelitis at L4-L5 with suspected intradiscal abscess. 2. 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 3. Circumferential epidural phlegmon within the spinal canal at L4-L5 extending into the sacrum, thickest along the L5 vertebral body where it measures up to 5 mm. No defined epidural abscess and no high grade spinal canal stenosis 4. Enhancement and edema about the bilateral  facet joints at L4-L5 is suspicious for septic arthritis, more convincing on the left. 5. Multilevel degenerative changes as above. Electronically Signed   By: Valetta Mole M.D.   On: 10/12/2022 10:21  US Venous Img Lower Bilateral  Result Date: 10/12/2022 CLINICAL DATA:  72 year old female with severe bilateral leg pain. EXAM: BILATERAL LOWER EXTREMITY VENOUS DOPPLER ULTRASOUND TECHNIQUE: Gray-scale sonography with compression, as well as color and duplex ultrasound, were performed to evaluate the deep venous system(s) from the level of the common femoral vein through the popliteal and proximal calf veins. COMPARISON:  03/20/2022 and earlier. FINDINGS: VENOUS Normal compressibility of the bilateral common femoral, superficial femoral, and popliteal veins, as well as the visualized calf veins. Visualized portions of the bilateral profunda femoral vein and great saphenous vein unremarkable. No filling defects to suggest DVT on grayscale or color Doppler imaging. Doppler waveforms show normal direction of venous flow, normal respiratory plasticity and response to augmentation. OTHER None. Limitations: none IMPRESSION: No evidence of bilateral lower extremity deep venous thrombosis. Electronically Signed   By: Genevie Ann M.D.   On: 10/12/2022 07:10    EKG: Independently reviewed.   Assessment/Plan Principal Problem:   Back pain Active Problems:   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-stage renal disease) (HCC)   ESRD on dialysis ([place])   Essential hypertension   Dyslipidemia   Seizure disorder (HCC)   Diabetic retinopathy without macular edema associated with type 2 diabetes mellitus (HCC)   GERD (gastroesophageal reflux disease)   Diabetic neuropathy (HCC)   Anemia of chronic disease   Insulin-requiring or dependent type II diabetes mellitus ([place])   Discitis   Myositis  Possible Discitis/osteomyelitis at L4-L5: w/ suspected intradiscal abscess as per MRI. IV cefepime, vanco ordered but will hold off on abxs until  cultures are done as per neuro surg & ID. NPO after midnight for procedure to obtain spinal culture w/ IR on 10/13/22. Neuro surg & ID consulted. Blood cxs ordered as well. ESR, lactic acid ordered. CRP is elevated  Possible Infectious myositis: of the adjacent psoas muscles with bilateral psoas abscesses, worse on the right as per MRI. Management as stated above  ESRD: on HD. HD today. Nephro following and recs apprec  ACD: likely secondary to ESRD. No need for a transfusion currently   Hypokalemia: will be managed w/ HD  Hx of seizures: continue on home dose of keppra   HLD: will continue on statin  Hx of COPD: w/o exacerbation. Continue on bronchodilators  DM2: likely poorly controlled. Repeat HbA1c ordered. Continue on SSI w/ accuchecks    Peripheral neuropathy: likely secondary to DM2. Continue on home dose of gabapentin   HTN: will continue on home dose of metoprolol   GERD: continue on PPI   Depression: severity unknown. Continue on home dose of sertraline   Overweight: BMI 28.2. Would benefit from weight loss    DVT prophylaxis: SCDs Code Status: full  Family Communication: discussed pt's care w/ pt's daughter, Maudie Mercury, and answered her questions  Disposition Plan: depends on PT/OT recs (not consulted yet) Consults called: Neuro surg (Dr. Izora Ribas). ID (Dr. Delaine Lame). Nephro (Dr. Juleen China). IR  Admission status: inpatient    Wyvonnia Dusky MD [facility]   If 7PM-7AM, please contact night-coverage [email or web address]   10/12/2022, 5:35 PM

## 2022-10-12 NOTE — ED Notes (Signed)
[place] at [place]

## 2022-10-12 NOTE — Progress Notes (Signed)
[facility]  ROUNDING NOTE   Subjective:   Cynthia Dean is a 72 y.o. 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 She presents to the ED with worsening lower extremity pain. Patient will be admitted for Bilateral leg pain [M79.604, M79.605] Back pain [M54.9]  Patient is known to our practice and receives outpatient dialysis treatments at [facility] on a MWF schedule, supervised by Dr Candiss Norse. Last treatment received on Monday. Patient is seen and evaluated at bedside with daughter present. Daughter states she was assisting patient with dressing for scheduled dialysis. Patient has chronic leg pain, but daughter states this pain was worse than usual. Patient is alert and oriented to self. Remains on room air. Daughter denies nausea, vomiting or poor oral intake. No lower extremity edema. Reports no recent missed dialysis treatments.   Labs on ED arrival significant for potassium 3.1, glucose 109, BUN 24, creatinine 6.68 with GFR 6, and hemoglobin 9.6.  Lower extremity venous Doppler negative for DVTs.  Lumbar spine MRI with and without contrast shows multiple concerns at L4-L5.  Some concerns including discitis/osteomyelitis, infectious myositis with bilateral abscesses and bilateral facet joint suspicious for septic arthritis on the left.  We have been consulted to manage dialysis needs during this admission.   Objective:  Vital signs in last 24 hours:  Temp:  [97.9 F (36.6 C)-98.3 F (36.8 C)] 97.9 F (36.6 C) (03/20 1052) Pulse Rate:  [62-67] 66 (03/20 1230) Resp:  [10-21] 14 (03/20 1230) BP: (161-184)/(67-87) 167/75 (03/20 1230) SpO2:  [94 %-100 %] 98 % (03/20 1230) Weight:  [96 kg-113.4 kg] 96 kg (03/20 1052)  Weight change:  Filed Weights   10/12/22 0555 10/12/22 1052  Weight: 113.4 kg 96 kg    Intake/Output: No  intake/output data recorded.   Intake/Output this shift:  No intake/output data recorded.  Physical Exam: General: NAD, resting comfortably at this time  Head: Normocephalic, atraumatic. Moist oral mucosal membranes  Eyes: Anicteric  Lungs:  Clear to auscultation, normal effort  Heart: Regular rate and rhythm  Abdomen:  Soft, nontender  Extremities: No peripheral edema.  Neurologic: Alert and oriented to self, moving all four extremities  Skin: No lesions  Access: Left AVG    Basic Metabolic Panel: Recent Labs  Lab 10/12/22 0605  NA 138  K 3.1*  CL 105  CO2 24  GLUCOSE 109*  BUN 24*  CREATININE 6.68*  CALCIUM 9.1    Liver Function Tests: No results for input(s): "AST", "ALT", "ALKPHOS", "BILITOT", "PROT", "ALBUMIN" in the last 168 hours. No results for input(s): "LIPASE", "AMYLASE" in the last 168 hours. No results for input(s): "AMMONIA" in the last 168 hours.  CBC: Recent Labs  Lab 10/12/22 0605  WBC 10.2  NEUTROABS 7.6  HGB 9.6*  HCT 35.0*  MCV 86.0  PLT 199    Cardiac Enzymes: No results for input(s): "CKTOTAL", "CKMB", "CKMBINDEX", "TROPONINI" in the last 168 hours.  BNP: Invalid input(s): "POCBNP"  CBG: No results for input(s): "GLUCAP" in the last 168 hours.  Microbiology: Results for orders placed or performed during the hospital encounter of 08/26/22  MRSA Next Gen by PCR, Nasal     Status: None   Collection Time: 08/28/22  4:20 PM   Specimen: Nasal Mucosa; Nasal Swab  Result Value Ref Range Status   MRSA by PCR Next Gen NOT DETECTED NOT DETECTED Final    Comment: (NOTE)  The GeneXpert MRSA Assay (FDA approved for NASAL specimens only), is one component of a comprehensive MRSA colonization surveillance program. It is not intended to diagnose MRSA infection nor to guide or monitor treatment for MRSA infections. Test performance is not FDA approved in patients less than 89 years old. Performed at [facility], [address] [place], [place] [address]   Gastrointestinal Panel by PCR , Stool     Status: None   Collection Time: 08/29/22  3:44 PM   Specimen: Stool  Result Value Ref Range Status   Campylobacter species NOT DETECTED NOT DETECTED Final   Plesimonas shigelloides NOT DETECTED NOT DETECTED Final   Salmonella species NOT DETECTED NOT DETECTED Final   Yersinia enterocolitica NOT DETECTED NOT DETECTED Final   Vibrio species NOT DETECTED NOT DETECTED Final   Vibrio cholerae NOT DETECTED NOT DETECTED Final   Enteroaggregative E coli (EAEC) NOT DETECTED NOT DETECTED Final   Enteropathogenic E coli (EPEC) NOT DETECTED NOT DETECTED Final   Enterotoxigenic E coli (ETEC) NOT DETECTED NOT DETECTED Final   Shiga like toxin producing E coli (STEC) NOT DETECTED NOT DETECTED Final   Shigella/Enteroinvasive E coli (EIEC) NOT DETECTED NOT DETECTED Final   Cryptosporidium NOT DETECTED NOT DETECTED Final   Cyclospora cayetanensis NOT DETECTED NOT DETECTED Final   Entamoeba histolytica NOT DETECTED NOT DETECTED Final   Giardia lamblia NOT DETECTED NOT DETECTED Final   Adenovirus F40/41 NOT DETECTED NOT DETECTED Final   Astrovirus NOT DETECTED NOT DETECTED Final   Norovirus GI/GII NOT DETECTED NOT DETECTED Final   Rotavirus A NOT DETECTED NOT DETECTED Final   Sapovirus (I, II, IV, and V) NOT DETECTED NOT DETECTED Final    Comment: Performed at [facility], [address] [place], [place] [address]  Resp panel by RT-PCR (RSV, Flu A&B, Covid) Anterior Nasal Swab     Status: None   Collection Time: 09/05/22  1:52 PM   Specimen: Anterior Nasal Swab  Result Value Ref Range Status   SARS Coronavirus 2 by RT PCR NEGATIVE NEGATIVE Final    Comment: (NOTE) SARS-CoV-2 target nucleic acids are NOT DETECTED.  The SARS-CoV-2 RNA is generally detectable in upper respiratory specimens during the acute phase of infection. The lowest concentration of SARS-CoV-2 viral copies this assay can detect is 138 copies/mL. A  negative result does not preclude SARS-Cov-2 infection and should not be used as the sole basis for treatment or other patient management decisions. A negative result may occur with  improper specimen collection/handling, submission of specimen other than nasopharyngeal swab, presence of viral mutation(s) within the areas targeted by this assay, and inadequate number of viral copies(<138 copies/mL). A negative result must be combined with clinical observations, patient history, and epidemiological information. The expected result is Negative.  Fact Sheet for Patients:  EntrepreneurPulse.com.au  Fact Sheet for Healthcare Providers:  IncredibleEmployment.be  This test is no t yet approved or cleared by the [place] FDA and  has been authorized for detection and/or diagnosis of SARS-CoV-2 by FDA under an Emergency Use Authorization (EUA). This EUA will remain  in effect (meaning this test can be used) for the duration of the COVID-19 declaration under Section 564(b)(1) of the Act, 21 U.S.C.section 360bbb-3(b)(1), unless the authorization is terminated  or revoked sooner.       Influenza A by PCR NEGATIVE NEGATIVE Final   Influenza B by PCR NEGATIVE NEGATIVE Final    Comment: (NOTE) The Xpert Xpress SARS-CoV-2/FLU/RSV plus assay is intended as  an aid in the diagnosis of influenza from Nasopharyngeal swab specimens and should not be used as a sole basis for treatment. Nasal washings and aspirates are unacceptable for Xpert Xpress SARS-CoV-2/FLU/RSV testing.  Fact Sheet for Patients: EntrepreneurPulse.com.au  Fact Sheet for Healthcare Providers: IncredibleEmployment.be  This test is not yet approved or cleared by the [place] FDA and has been authorized for detection and/or diagnosis of SARS-CoV-2 by FDA under an Emergency Use Authorization (EUA). This EUA will remain in effect (meaning this test can  be used) for the duration of the COVID-19 declaration under Section 564(b)(1) of the Act, 21 U.S.C. section 360bbb-3(b)(1), unless the authorization is terminated or revoked.     Resp Syncytial Virus by PCR NEGATIVE NEGATIVE Final    Comment: (NOTE) Fact Sheet for Patients: EntrepreneurPulse.com.au  Fact Sheet for Healthcare Providers: IncredibleEmployment.be  This test is not yet approved or cleared by the [place] FDA and has been authorized for detection and/or diagnosis of SARS-CoV-2 by FDA under an Emergency Use Authorization (EUA). This EUA will remain in effect (meaning this test can be used) for the duration of the COVID-19 declaration under Section 564(b)(1) of the Act, 21 U.S.C. section 360bbb-3(b)(1), unless the authorization is terminated or revoked.  Performed at [facility], [place], [place] [address]   Respiratory (~20 pathogens) panel by PCR     Status: None   Collection Time: 09/05/22  2:50 PM  Result Value Ref Range Status   Adenovirus NOT DETECTED NOT DETECTED Final   Coronavirus 229E NOT DETECTED NOT DETECTED Final    Comment: (NOTE) The Coronavirus on the Respiratory Panel, DOES NOT test for the novel  Coronavirus (2019 nCoV)    Coronavirus HKU1 NOT DETECTED NOT DETECTED Final   Coronavirus NL63 NOT DETECTED NOT DETECTED Final   Coronavirus OC43 NOT DETECTED NOT DETECTED Final   Metapneumovirus NOT DETECTED NOT DETECTED Final   Rhinovirus / Enterovirus NOT DETECTED NOT DETECTED Final   Influenza A NOT DETECTED NOT DETECTED Final   Influenza B NOT DETECTED NOT DETECTED Final   Parainfluenza Virus 1 NOT DETECTED NOT DETECTED Final   Parainfluenza Virus 2 NOT DETECTED NOT DETECTED Final   Parainfluenza Virus 3 NOT DETECTED NOT DETECTED Final   Parainfluenza Virus 4 NOT DETECTED NOT DETECTED Final   Respiratory Syncytial Virus NOT DETECTED NOT DETECTED Final   Bordetella pertussis NOT  DETECTED NOT DETECTED Final   Bordetella Parapertussis NOT DETECTED NOT DETECTED Final   Chlamydophila pneumoniae NOT DETECTED NOT DETECTED Final   Mycoplasma pneumoniae NOT DETECTED NOT DETECTED Final    Comment: Performed at [facility], [address], [place], [place] [address]    Coagulation Studies: No results for input(s): "LABPROT", "INR" in the last 72 hours.  Urinalysis: No results for input(s): "COLORURINE", "LABSPEC", "PHURINE", "GLUCOSEU", "HGBUR", "BILIRUBINUR", "KETONESUR", "PROTEINUR", "UROBILINOGEN", "NITRITE", "LEUKOCYTESUR" in the last 72 hours.  Invalid input(s): "APPERANCEUR"    Imaging: MR Lumbar Spine W Wo Contrast  Result Date: 10/12/2022 CLINICAL DATA:  Bilateral leg pain for 3 weeks. EXAM: MRI LUMBAR SPINE WITHOUT AND WITH CONTRAST TECHNIQUE: Multiplanar and multiecho pulse sequences of the lumbar spine were obtained without and with intravenous contrast. CONTRAST:  36mL GADAVIST GADOBUTROL 1 MMOL/ML IV SOLN COMPARISON:  CT abdomen/pelvis 06/27/2022, lumbar spine MRI 05/11/2022 FINDINGS: Segmentation: Standard; the lowest formed disc space is designated L5-S1. Alignment:  Normal. Vertebrae: Vertebral body heights are preserved. Background marrow signal is normal There is abnormal T1 hypointensity with edema and  postcontrast enhancement of the L4 and L5 vertebral bodies along the disc space. There is fluid within the disc space with abnormal enhancement at the periphery of the disc bilaterally suspicious for intradiscal abscess. Findings concerning for discitis/osteomyelitis. There is circumferential enhancing material within the spinal canal at L4 extending into the sacrum likely reflecting epidural phlegmon, thickest along the L5 vertebral body where it measures up to 5 mm there is no defined abscess. There is STIR signal abnormality and enhancement about the bilateral facet joints at this level suspicious for septic arthritis, more convincing on the left (13-28,  29, 12-13, 7). There is no other abnormal marrow edema or enhancement. Conus medullaris and cauda equina: Conus extends to the L1-L2 level. Conus and cauda equina appear normal. There is no abnormal enhancement of the conus or cauda equina nerve roots. Paraspinal and other soft tissues: There is a edema and enhancement in the bilateral psoas muscles consistent with infectious myositis. There is a multiloculated abscess in the right psoas muscle measuring up to approximately 1.7 cm AP x 1.4 cm TV by 2.5 cm cc (13-28). On the left, there is smaller abscess measuring 0.9 cm x 0.8 cm by 1.0 cm (13-26, 12-15). Bilateral renal cysts are noted. Disc levels: There is disc desiccation and narrowing most advanced at L5-S1 with overall mild disc degeneration at the other levels. T12-L1: No significant spinal canal or neural foraminal stenosis. L1-L2: No significant spinal canal or neural foraminal stenosis. L2-L3: There is a diffuse disc bulge and left worse than right facet arthropathy with 6 mm nodular T2 hypointense lesion projecting medially from the left facet joint favored to reflect a complex synovial cyst. Findings again resulting in mild spinal canal stenosis with left subarticular zone narrowing and mild bilateral neural foraminal stenosis, unchanged. L3-L4: There is a mild disc bulge and moderate bilateral facet arthropathy with ligamentum flavum thickening resulting in moderate bilateral neural foraminal stenosis without significant spinal canal stenosis, unchanged. L4-L5: There is a disc bulge and moderate bilateral facet arthropathy superimposed on acute infectious findings detailed above resulting in mild-to-moderate spinal canal stenosis with bilateral subarticular zone narrowing and moderate bilateral neural foraminal stenosis. L5-S1: There is a diffuse disc bulge with a central annular fissure and mild bilateral facet arthropathy resulting in bilateral subarticular zone narrowing and moderate bilateral neural  foraminal stenosis. IMPRESSION: 1. Findings concerning for discitis/osteomyelitis at L4-L5 with suspected intradiscal abscess. 2. 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 3. Circumferential epidural phlegmon within the spinal canal at L4-L5 extending into the sacrum, thickest along the L5 vertebral body where it measures up to 5 mm. No defined epidural abscess and no high grade spinal canal stenosis 4. Enhancement and edema about the bilateral facet joints at L4-L5 is suspicious for septic arthritis, more convincing on the left. 5. Multilevel degenerative changes as above. Electronically Signed   By: Valetta Mole M.D.   On: 10/12/2022 10:21   US Venous Img Lower Bilateral  Result Date: 10/12/2022 CLINICAL DATA:  72 year old female with severe bilateral leg pain. EXAM: BILATERAL LOWER EXTREMITY VENOUS DOPPLER ULTRASOUND TECHNIQUE: Gray-scale sonography with compression, as well as color and duplex ultrasound, were performed to evaluate the deep venous system(s) from the level of the common femoral vein through the popliteal and proximal calf veins. COMPARISON:  03/20/2022 and earlier. FINDINGS: VENOUS Normal compressibility of the bilateral common femoral, superficial femoral, and popliteal veins, as well as the visualized calf veins. Visualized portions of the  bilateral profunda femoral vein and great saphenous vein unremarkable. No filling defects to suggest DVT on grayscale or color Doppler imaging. Doppler waveforms show normal direction of venous flow, normal respiratory plasticity and response to augmentation. OTHER None. Limitations: none IMPRESSION: No evidence of bilateral lower extremity deep venous thrombosis. Electronically Signed   By: Genevie Ann M.D.   On: 10/12/2022 07:10     Medications:    anticoagulant sodium citrate     ceFEPime (MAXIPIME) IV     vancomycin     Followed by   vancomycin      Chlorhexidine Gluconate Cloth  6  each Topical Q0600   sodium chloride flush  3 mL Intravenous Q12H   acetaminophen **OR** acetaminophen, alteplase, anticoagulant sodium citrate, heparin, hydrALAZINE, lidocaine (PF), lidocaine-prilocaine, morphine injection, pentafluoroprop-tetrafluoroeth, senna-docusate  Assessment/ Plan:  Ms. Armonni Skogen is a 72 y.o.  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 She presents to the ED with worsening lower extremity pain. Patient will be admitted for Bilateral leg pain [M79.604, M79.605] Back pain [M54.9]  [facility]/MWF/Lt AVG /102.5kg  End-stage renal disease on hemodialysis.  Will maintain outpatient schedule if possible.  Patient scheduled for dialysis today, UF goal 1.5 to 2 L as tolerated.  Next treatment scheduled for Friday.  Hepatitis B panel ordered per hospital protocol.  2. Anemia of chronic kidney disease Normocytic Lab Results  Component Value Date   HGB 9.6 (L) 10/12/2022  Patient receives Mircera at outpatient clinic.  Hemoglobin within desired range at this time.  3. Secondary Hyperparathyroidism: with outpatient labs: PTH 180, phosphorus 4.5, calcium 9.1 on 10/03/22.   Lab Results  Component Value Date   PTH 131 (H) 03/14/2018   CALCIUM 9.1 10/12/2022   CAION 1.09 (L) 08/28/2022   PHOS 5.4 (H) 09/09/2022    Will continue to monitor bone minerals during this admission  4.  Hypertension with chronic kidney disease.  Home regimen includes losartan, metoprolol, and torsemide.  All currently held.  5. Diabetes mellitus type II with chronic kidney disease/renal manifestations: insulin dependent. Home regimen includes Novolin 70/30. Most recent hemoglobin A1c is 5.8 on 03/23/22.     LOS: 0   3/20/202412:34 PM

## 2022-10-12 NOTE — ED Provider Notes (Signed)
[facility] Provider Note    Event Date/Time   First MD Initiated Contact with Patient 10/12/22 0543     (approximate)   History   Leg Pain (BIB medic.  Pt rpting bilateral leg pain x 3weeks.  Per medic, pt is non-ambulatory at baseline.  Medic advises pt is A&O x2, pt disoriented to self and place )   HPI  Cynthia Dean is a 72 y.o. female who presents to the ED for evaluation of Leg Pain (BIB medic.  Pt rpting bilateral leg pain x 3weeks.  Per medic, pt is non-ambulatory at baseline.  Medic advises pt is A&O x2, pt disoriented to self and place )   I reviewed 2/16 medical DC summary.  History of ESRD on MWF iHD, DM, COPD on 4 L, CAD, seizure disorder.  Anticoagulation was discontinued during this visit due to recurrent needs for blood transfusions and GI losses.  Patient presents to the ED via EMS from home for evaluation of bilateral leg pain, acute on chronic.  Majority of history is provided by the daughter over the phone.   Reportedly the chronic leg pain was acutely worse this morning such that patient would not and could not get up to get dressed and go to dialysis.  No reported trauma, falls or injuries.   Physical Exam   Triage Vital Signs: ED Triage Vitals  Enc Vitals Group     BP      Pulse      Resp      Temp      Temp src      SpO2      Weight      Height      Head Circumference      Peak Flow      Pain Score      Pain Loc      Pain Edu?      Excl. in [place]?     Most recent vital signs: Vitals:   10/12/22 0555 10/12/22 0604  BP: (!) 183/72   Pulse:    Resp:  (!) 21  Temp:    SpO2:      General: Awake, no distress.  CV:  Good peripheral perfusion.  Resp:  Normal effort.  Abd:  No distention.  MSK:  No deformity noted.  Neuro:  No focal deficits appreciated. Other:  No apparent deformity, traumatic pathology or any tenderness to her bilateral legs.   ED Results / Procedures / Treatments   Labs (all labs  ordered are listed, but only abnormal results are displayed) Labs Reviewed  CBC WITH DIFFERENTIAL/PLATELET - Abnormal; Notable for the following components:      Result Value   Hemoglobin 9.6 (*)    HCT 35.0 (*)    MCH 23.6 (*)    MCHC 27.4 (*)    RDW 21.2 (*)    All other components within normal limits  BASIC METABOLIC PANEL    EKG   RADIOLOGY   Official radiology report(s): US Venous Img Lower Bilateral  Result Date: 10/12/2022 CLINICAL DATA:  72 year old female with severe bilateral leg pain. EXAM: BILATERAL LOWER EXTREMITY VENOUS DOPPLER ULTRASOUND TECHNIQUE: Gray-scale sonography with compression, as well as color and duplex ultrasound, were performed to evaluate the deep venous system(s) from the level of the common femoral vein through the popliteal and proximal calf veins. COMPARISON:  03/20/2022 and earlier. FINDINGS: VENOUS Normal compressibility of the bilateral common femoral, superficial femoral, and popliteal veins, as well  as the visualized calf veins. Visualized portions of the bilateral profunda femoral vein and great saphenous vein unremarkable. No filling defects to suggest DVT on grayscale or color Doppler imaging. Doppler waveforms show normal direction of venous flow, normal respiratory plasticity and response to augmentation. OTHER None. Limitations: none IMPRESSION: No evidence of bilateral lower extremity deep venous thrombosis. Electronically Signed   By: Genevie Ann M.D.   On: 10/12/2022 07:10    PROCEDURES and INTERVENTIONS:  Procedures  Medications  acetaminophen (TYLENOL) tablet 1,000 mg (1,000 mg Oral Given 10/12/22 0610)  oxyCODONE (Oxy IR/ROXICODONE) immediate release tablet 5 mg (5 mg Oral Given 10/12/22 0609)     IMPRESSION / MDM / ASSESSMENT AND PLAN / ED COURSE  I reviewed the triage vital signs and the nursing notes.  Differential diagnosis includes, but is not limited to, muscle spasm, restless leg, DVT, fracture  {Patient presents with  symptoms of an acute illness or injury that is potentially life-threatening.  Bedbound dialysis patient presents with acute on chronic leg pain.  No apparent pathology on exam, no tenderness.  No reported falls or injuries and no signs of trauma externally.  Will obtain a DVT ultrasound and screening basic labs.  Clinical Course as of 10/12/22 0742  Wed Oct 12, 2022  0554 Tried calling Clarene Duke. Straight to VM [DS]  [phone] I call Burnis Medin, daughter. She was getting pt ready for dialysis this AM. She's been having a lot of leg pain recently. Couldn't get patient up this AM because of this pain. Both legs, been going on for about a month. Was dialyzed on Monday. Crying all night, not sleeping.  [DS]  0703 ESRD and presents with bilateral leg pain.  No trauma. Difficult to get up to go to HD this morning.  No longer on AC so working up for DVT.  Labs and [place] and likely dc home.  [SM]    Clinical Course User Index [DS] Vladimir Crofts, MD [SM] Nathaniel Man, MD     FINAL CLINICAL IMPRESSION(S) / ED DIAGNOSES   Final diagnoses:  None     Rx / DC Orders   ED Discharge Orders     None        Note:  This document was prepared using Dragon voice recognition software and may include unintentional dictation errors.   Vladimir Crofts, MD 10/12/22 [phone]

## 2022-10-12 NOTE — Consult Note (Signed)
PHARMACY -  BRIEF ANTIBIOTIC NOTE   Pharmacy has received consult(s) for vancomycin and cefepime from an ED provider.  The patient's profile has been reviewed for ht/wt/allergies/indication/available labs.    One time order(s) placed for  Cefepime 1 gram Vancomycin 2500 mg   Further antibiotics/pharmacy consults should be ordered by admitting physician if indicated.                       Thank you, Dorothe Pea, PharmD, BCPS Clinical Pharmacist   10/12/2022  11:03 AM

## 2022-10-12 NOTE — ED Notes (Signed)
Pt unable to appropriately respond to assessment questions during triage.  When this RN asked about any allergies to medications, pt sts "NO".  RN observed allergy list in pt chart

## 2022-10-12 NOTE — ED Triage Notes (Signed)
BIB medic.  Medic advised pt reported bilateral leg pain x1 week.  Rpts pt is non-ambulatory at baseline.  Advised pt had been combative, pulling at emergency personnel.  Medic advised pt lived at home with her husband, and upon their assessment, pt was A&O x2, with disorientation to self and place

## 2022-10-12 NOTE — Discharge Planning (Signed)
[facility]  [address], [place] [address] [phone]  Scheduled Days Monday Wednesday and Friday  Treatment Time: 6:00am  Contacted for an ED diversion. Had patient rescheduled tomorrow at [place] with Daughter, [name], regarding schedule, however patient is now being admitted. Additionally, it was discussed about a possible need for a hospice consult. [name] expressed concerns that patient is in a lot of pain and has stated a few times that she would like to stop dialysis. [employer] NP is aware of this concern.

## 2022-10-12 NOTE — Progress Notes (Signed)
Hemodialysis note  Received patient in bed to unit. Alert and oriented.  Informed consent signed and in chart.  Treatment initiated: 1105 Treatment completed: 1440  Patient tolerated well. Transported back to room, alert without acute distress.  Report given to patient's RN.   Access used: LUA AVG Access issues: none  Total UF removed: 1.5L Medication(s) given:  Morphine 2 mg IV  Post HD weight: 94.4 kg   [facility]

## 2022-10-12 NOTE — ED Notes (Signed)
Pt transported to Dialysis ?

## 2022-10-12 NOTE — ED Provider Notes (Addendum)
Care assumed of patient from outgoing provider.  See their note for initial history, exam and plan.  0703 ESRD and presents with bilateral leg pain.  No trauma. Difficult to get up to go to HD this morning.  No longer on AC so working up for DVT.  Labs and [place] and likely dc home.  [SM]     Lab work does not meet criteria for emergent dialysis.  Ultrasound with no signs of DVT.  On reevaluation patient continues to have severe lower back pain with movement.  After further discussion with the patient and the daughter she has breast cancer that is HER2/neu positive and did not complete chemotherapy since she was unable to tolerate.  Concern for possible metastasis, given her ongoing symptoms will obtain MRI.  MRI with contrast after discussion with the MRI tech.  Will arrange for hemodialysis after contrast given that she does still make urine.  MRI concerning for possible discitis/osteomyelitis at L4-L5 with concern for intra disc abscess, infectious myositis of adjacent psoas muscles with bilateral psoas abscesses that is worse on the right, circumferential epidural phlegmon without a defined abscess.  Edema to bilateral hip joints concerning for possible septic arthritis.  Consulted neurosurgery Dr. Cari Caraway, currently scrubbed into a surgery, sent a secure message and will review MRI imaging after the case.  Will admit to the hospitalist for further management.  Started on IV vancomycin and cefepime, blood cultures and lactic acid added on.  Patient received hemodialysis from the emergency department.  .Critical Care  Performed by: Nathaniel Man, MD Authorized by: Nathaniel Man, MD   Critical care provider statement:    Critical care time (minutes):  30   Critical care time was exclusive of:  Separately billable procedures and treating other patients   Critical care was necessary to treat or prevent imminent or life-threatening deterioration of the following conditions: epidural  abscess/phelgmon.   Critical care was time spent personally by me on the following activities:  Development of treatment plan with patient or surrogate, discussions with consultants, evaluation of patient's response to treatment, examination of patient, ordering and review of laboratory studies, ordering and review of radiographic studies, ordering and performing treatments and interventions, pulse oximetry, re-evaluation of patient's condition and review of old charts     Nathaniel Man, MD 10/12/22 [place]    Nathaniel Man, MD 10/12/22 1101

## 2022-10-12 NOTE — Consult Note (Signed)
NAME: Cynthia Dean  DOB: 19-Jan-1951  MRN: [id number]  Date/Time: 10/12/2022 5:29 PM  REQUESTING PROVIDER:Dr.Williams Subjective:  REASON FOR CONSULT: psoas abscess ? Cynthia Dean is a 72 y.o. with a history of ESRD on dialysis, left ca breast s/p lumpecomy and LN resection unable to tolerate Rx radiation presented with 3 week h/o pain legs and inability to walk Pt was in [facility] in Feb 2024 for Hb< 6 anemia and underwent GI work up upper endoscopy and colonoscopy. There was diverticulosis but no obvious bleeding- She was discharged to SNF. She went home 3 weeks ago On 2/29 she had Avfistulogram for a malfunctioning left brachial artery to axillary vein Avgraft. Had balloon angioplasty and stent placement to AV graft She lives with her daughter and has been c/o low back ache and pain legs rt > left and inability to walk . No fever, feels weak She came to the ED  10/12/22  BP 153/68 !  Temp 98 F (36.7 C)  Pulse Rate 66  Resp 18  SpO2 91 %  O2 Flow Rate (L/min) 2 L/min  Weight 208 lb 1.8 oz  Height 6' (1.829 m)    Latest Reference Range & Units 10/12/22  WBC 4.0 - 10.5 K/uL 10.2  Hemoglobin 12.0 - 15.0 g/dL 9.6 (L)  HCT 36.0 - 46.0 % 35.0 (L)  Platelets 150 - 400 K/uL 199  Creatinine 0.44 - 1.00 mg/dL 6.68 (H)  Hepatitis B Surface Ag NON REACTIVE  NON REACTIVE [1]  And on Daughters insistence and she talking to her PCP , MRI was done and it revealed findings concerning for L4-L5 discitis , epidural phlegmon extending to sacrum and b/l psoas muscle abscess rt > left IR and neurosurgery were consulted, blood culture sent  I am asked to see patient for the same  Past Medical History:  Diagnosis Date   Acute on chronic respiratory failure with hypoxia ([place]) 07/30/2019   Acute respiratory failure with hypoxia ([place]) 12/31/2018   Carotid arterial disease ([place])    a. 02/2018 < bilat ICA stenosis.   COPD (chronic obstructive pulmonary disease) (H[facility])    Coronary artery disease     a. 11/2015 MV: EF 57%, no ischemia/infarct.   Cryptogenic stroke ([place])    a. 10/2017 s/p implantable loop recorder (no Afib to date).   Diabetes mellitus without complication ([place])    Diarrhea    ESRD on dialysis [facility])    GI bleed    a. 01/2019 EGD/Colonoscopy: Mild gastritis. Colitis.   Heart murmur    a. 12/2018 Echo: EF 55-60%, impaired relaxation. Mod dil LA. Mildly dil RA. Mild Ca2+ of AoV.   Hematemesis 06/27/2017   History of 2019 novel coronavirus disease (COVID-19) 07/30/2019   Hyperkalemia    Hyperlipidemia    Hypertension    Intractable nausea and vomiting 08/08/2015   Nausea & vomiting 05/30/2018   Nausea and vomiting 05/29/2018   Pneumonia due to COVID-19 virus     Past Surgical History:  Procedure Laterality Date   A/V FISTULAGRAM Left 12/20/2016   Procedure: A/V Fistulagram;  Surgeon: Katha Cabal, MD;  Location: [place] [facility];  Service: Cardiovascular;  Laterality: Left;   A/V FISTULAGRAM Left 09/22/2022   Procedure: A/V Fistulagram;  Surgeon: Algernon Huxley, MD;  Location: [place] [facility];  Service: Cardiovascular;  Laterality: Left;   A/V SHUNT INTERVENTION N/A 12/20/2016   Procedure: A/V Shunt Intervention;  Surgeon: Katha Cabal, MD;  Location: [place] [facility];  Service: Cardiovascular;  Laterality: N/A;   A/V SHUNTOGRAM Left 09/11/2017   Procedure: A/V SHUNTOGRAM;  Surgeon: Algernon Huxley, MD;  Location: [place] [facility];  Service: Cardiovascular;  Laterality: Left;   A/V SHUNTOGRAM Left 11/21/2019   Procedure: A/V SHUNTOGRAM;  Surgeon: Algernon Huxley, MD;  Location: [place] [facility];  Service: Cardiovascular;  Laterality: Left;   BREAST BIOPSY Bilateral 07/19/2000   neg   BREAST BIOPSY Left 11/03/2021   [place] bx mass at 3:00, venus marker, axilla bx-hyrdo marker, path pending   COLONOSCOPY WITH PROPOFOL N/A 02/16/2019   Procedure: COLONOSCOPY WITH PROPOFOL;  Surgeon: Toledo, Benay Pike, MD;  Location: [facility] ENDOSCOPY;  Service:  Gastroenterology;  Laterality: N/A;   COLONOSCOPY WITH PROPOFOL N/A 08/31/2022   Procedure: COLONOSCOPY WITH PROPOFOL;  Surgeon: Annamaria Helling, DO;  Location: [facility] ENDOSCOPY;  Service: Gastroenterology;  Laterality: N/A;   ESOPHAGOGASTRODUODENOSCOPY (EGD) WITH PROPOFOL N/A 02/16/2019   Procedure: ESOPHAGOGASTRODUODENOSCOPY (EGD) WITH PROPOFOL;  Surgeon: Toledo, Benay Pike, MD;  Location: [facility] ENDOSCOPY;  Service: Gastroenterology;  Laterality: N/A;   ESOPHAGOGASTRODUODENOSCOPY (EGD) WITH PROPOFOL N/A 08/28/2022   Procedure: ESOPHAGOGASTRODUODENOSCOPY (EGD) WITH PROPOFOL;  Surgeon: Annamaria Helling, DO;  Location: [place];  Service: Gastroenterology;  Laterality: N/A;   IR US GUIDE BX ASP/DRAIN  03/25/2022   LOOP RECORDER INSERTION N/A 11/16/2017   Procedure: LOOP RECORDER INSERTION;  Surgeon: Deboraha Sprang, MD;  Location: [place] [facility];  Service: Cardiovascular;  Laterality: N/A;   PERIPHERAL VASCULAR CATHETERIZATION Left 02/02/2015   Procedure: A/V Shuntogram/Fistulagram;  Surgeon: Algernon Huxley, MD;  Location: [place] [facility];  Service: Cardiovascular;  Laterality: Left;   PERIPHERAL VASCULAR CATHETERIZATION Left 02/02/2015   Procedure: A/V Shunt Intervention;  Surgeon: Algernon Huxley, MD;  Location: [place] [facility];  Service: Cardiovascular;  Laterality: Left;   PERIPHERAL VASCULAR CATHETERIZATION Left 03/09/2015   Procedure: A/V Shuntogram/Fistulagram;  Surgeon: Algernon Huxley, MD;  Location: [place] [facility];  Service: Cardiovascular;  Laterality: Left;   PERIPHERAL VASCULAR CATHETERIZATION N/A 03/09/2015   Procedure: A/V Shunt Intervention;  Surgeon: Algernon Huxley, MD;  Location: [place] [facility];  Service: Cardiovascular;  Laterality: N/A;   PERIPHERAL VASCULAR CATHETERIZATION Left 04/17/2015   Procedure: Upper Extremity Angiography;  Surgeon: Algernon Huxley, MD;  Location: [place] [facility];  Service: Cardiovascular;  Laterality: Left;   PERIPHERAL VASCULAR  CATHETERIZATION  04/17/2015   Procedure: Upper Extremity Intervention;  Surgeon: Algernon Huxley, MD;  Location: [place] [facility];  Service: Cardiovascular;;   PERIPHERAL VASCULAR CATHETERIZATION N/A 08/10/2015   Procedure: A/V Shuntogram/Fistulagram;  Surgeon: Algernon Huxley, MD;  Location: [place] [facility];  Service: Cardiovascular;  Laterality: N/A;   PERIPHERAL VASCULAR CATHETERIZATION N/A 08/10/2015   Procedure: A/V Shunt Intervention;  Surgeon: Algernon Huxley, MD;  Location: [place] [facility];  Service: Cardiovascular;  Laterality: N/A;   PERIPHERAL VASCULAR CATHETERIZATION Left 04/11/2016   Procedure: A/V Shuntogram/Fistulagram;  Surgeon: Algernon Huxley, MD;  Location: [place] [facility];  Service: Cardiovascular;  Laterality: Left;   TEE WITHOUT CARDIOVERSION N/A 11/16/2017   Procedure: TRANSESOPHAGEAL ECHOCARDIOGRAM (TEE);  Surgeon: Minna Merritts, MD;  Location: [facility];  Service: Cardiovascular;  Laterality: N/A;    Social History   Socioeconomic History   Marital status: Single    Spouse name: Not on file   Number of children: 5   Years of education: Not on file   Highest education level: Not on file  Occupational History   Occupation: Disability   Tobacco Use   Smoking status: Former    Packs/day: 0    Types: Cigarettes   Smokeless tobacco: Never  Vaping Use   Vaping Use: Never used  Substance and Sexual Activity   Alcohol use: No   Drug use: No   Sexual activity: Not Currently  Other Topics Concern   Not on file  Social History Narrative   Live with Fiance   Social Determinants of Health   Financial Resource Strain: Low Risk  (11/12/2017)   Overall Financial Resource Strain (CARDIA)    Difficulty of Paying Living Expenses: Not hard at all  Food Insecurity: No Food Insecurity (08/27/2022)   Hunger Vital Sign    Worried About Running Out of Food in the Last Year: Never true    [place] in the Last Year: Never true  Transportation Needs: No  Transportation Needs (08/27/2022)   PRAPARE - Hydrologist (Medical): No    Lack of Transportation (Non-Medical): No  Physical Activity: Insufficiently Active (11/12/2017)   Exercise Vital Sign    Days of Exercise per Week: 5 days    Minutes of Exercise per Session: 20 min  Stress: No Stress Concern Present (11/12/2017)   [place]    Feeling of Stress : Not at all  Social Connections: Moderately Integrated (11/12/2017)   Social Connection and Isolation Panel [NHANES]    Frequency of Communication with Friends and Family: Twice a week    Frequency of Social Gatherings with Friends and Family: Twice a week    Attends Religious Services: More than 4 times per year    Active Member of [employer] or Organizations: Yes    Attends Music therapist: More than 4 times per year    Marital Status: Never married  Intimate Partner Violence: Not At Risk (08/27/2022)   Humiliation, Afraid, Rape, and Kick questionnaire    Fear of Current or Ex-Partner: No    Emotionally Abused: No    Physically Abused: No    Sexually Abused: No    Family History  Problem Relation Age of Onset   Breast cancer Father 11   Stroke Mother    Heart attack Mother    Heart Problems Sister    Breast cancer Cousin 24       1 st cousin. Maternal    Allergies  Allergen Reactions   Oxycodone Nausea And Vomiting   Oxycodone-Acetaminophen Other (See Comments)   Wound Dressing Adhesive    Hydrocodone     Intolerant more than allergic   Tape Itching    Skin Dermatitis/itching (tape adhesive) Skin Dermatitis/itching (tape adhesive)   Tapentadol Itching    Skin Dermatitis/itching (tape adhesive) Skin Dermatitis/itching (tape adhesive)    I? Current Facility-Administered Medications  Medication Dose Route Frequency Provider Last Rate Last Admin   acetaminophen (TYLENOL) tablet 650 mg  650 mg Oral Q6H PRN Wyvonnia Dusky, MD       Or   acetaminophen (TYLENOL) suppository 650 mg  650 mg Rectal Q6H PRN Wyvonnia Dusky, MD       alteplase (CATHFLO ACTIVASE) injection 2 mg  2 mg Intracatheter Once PRN Colon Flattery, NP       anticoagulant sodium citrate solution 5 mL  5 mL Intracatheter PRN Breeze, Shantelle, NP       ceFEPIme (MAXIPIME) 1 g in sodium chloride 0.9 % 100 mL  IVPB  1 g Intravenous Q24H Dorothe Pea, RPH       Chlorhexidine Gluconate Cloth 2 % PADS 6 each  6 each Topical Q0600 Breeze, Benancio Deeds, NP       heparin injection 1,000 Units  1,000 Units Intracatheter PRN Colon Flattery, NP       hydrALAZINE (APRESOLINE) injection 20 mg  20 mg Intravenous Q6H PRN Wyvonnia Dusky, MD       lidocaine (PF) (XYLOCAINE) 1 % injection 5 mL  5 mL Intradermal PRN Colon Flattery, NP       lidocaine-prilocaine (EMLA) cream 1 Application  1 Application Topical PRN Colon Flattery, NP       morphine (PF) 2 MG/ML injection 2 mg  2 mg Intravenous Q2H PRN Wyvonnia Dusky, MD   2 mg at 10/12/22 1308   pentafluoroprop-tetrafluoroeth (GEBAUERS) aerosol 1 Application  1 Application Topical PRN Colon Flattery, NP       senna-docusate (Senokot-S) tablet 1 tablet  1 tablet Oral QHS PRN Wyvonnia Dusky, MD       sodium chloride flush (NS) 0.9 % injection 3 mL  3 mL Intravenous Q12H Wyvonnia Dusky, MD       vancomycin (VANCOCIN) IVPB 1000 mg/200 mL premix  1,000 mg Intravenous Once Dorothe Pea, RPH       Followed by   vancomycin (VANCOREADY) IVPB 1500 mg/300 mL  1,500 mg Intravenous Once Dorothe Pea, [facility]       Current Outpatient Medications  Medication Sig Dispense Refill   diclofenac Sodium (VOLTAREN) 1 % GEL Apply 2 g topically 4 (four) times daily.     diphenhydrAMINE (BENADRYL) 25 MG tablet Take 25 mg by mouth at bedtime as needed.     traMADol (ULTRAM) 50 MG tablet Take 50 mg by mouth 2 (two) times daily.     acetaminophen (TYLENOL) 500 MG tablet Take 1 tablet (500 mg  total) by mouth every 6 (six) hours as needed. 30 tablet 1   aspirin EC 81 MG tablet Take 1 tablet (81 mg total) by mouth daily. Swallow whole. 30 tablet 1   atorvastatin (LIPITOR) 80 MG tablet Take 1 tablet (80 mg total) by mouth every evening. 30 tablet 0   BREO ELLIPTA 100-25 MCG/ACT AEPB Inhale 1 puff into the lungs daily.     cinacalcet (SENSIPAR) 30 MG tablet Take 1 tablet (30 mg total) by mouth daily with breakfast. 60 tablet 1   cyclobenzaprine (FLEXERIL) 10 MG tablet Take 1 tablet (10 mg total) by mouth 3 (three) times daily as needed for muscle spasms. 30 tablet 0   diphenoxylate-atropine (LOMOTIL) 2.5-0.025 MG tablet SMARTSIG:1 Tablet(s) By Mouth Every 12 Hours PRN 30 tablet 1   epoetin alfa (EPOGEN) 10000 UNIT/ML injection Inject 1 mL (10,000 Units total) into the vein every Monday, Wednesday, and Friday with hemodialysis. 1 mL    fluticasone (FLONASE) 50 MCG/ACT nasal spray Place 1 spray into both nostrils daily. 1 g 0   gabapentin (NEURONTIN) 100 MG capsule Take 1 capsule (100 mg total) by mouth 2 (two) times daily. Additional capsule as needed once daily for pain, total maximum daily dose 3 capsules (300 mg) 90 capsule 0   glucose 4 GM chewable tablet Chew 1 tablet (4 g total) by mouth as needed for low blood sugar. 50 tablet 1   insulin NPH-regular Human (NOVOLIN 70/30) (70-30) 100 UNIT/ML injection Inject 28 Units into the skin 2 (two) times daily with a meal. 10 mL 1  levETIRAcetam (KEPPRA) 1000 MG tablet Take 1 tablet (1,000 mg total) by mouth daily. 30 tablet 0   levETIRAcetam (KEPPRA) 250 MG tablet Take 1 tablet (250 mg total) by mouth 3 (three) times a week. 12 tablet 0   lidocaine (LIDODERM) 5 % Place 1 patch onto the skin daily. Remove & Discard patch within 12 hours or as directed by MD 30 patch 0   losartan (COZAAR) 100 MG tablet Take 1 tablet (100 mg total) by mouth at bedtime. 30 tablet 0   metoprolol succinate (TOPROL-XL) 100 MG 24 hr tablet Take 1 tablet (100 mg total)  by mouth daily. 30 tablet 0   omeprazole (PRILOSEC) 20 MG capsule Take 1 capsule (20 mg total) by mouth 2 (two) times daily. 60 capsule 0   polyethylene glycol (MIRALAX / GLYCOLAX) 17 g packet Take 17 g by mouth daily. 14 each 0   senna-docusate (SENOKOT-S) 8.6-50 MG tablet Take 2 tablets by mouth at bedtime as needed for mild constipation or moderate constipation.     sertraline (ZOLOFT) 25 MG tablet Take 1 tablet (25 mg total) by mouth daily. 30 tablet 0   torsemide (DEMADEX) 100 MG tablet Take 1 tablet (100 mg total) by mouth daily. 30 tablet 0   traZODone (DESYREL) 50 MG tablet Take 1 tablet (50 mg total) by mouth at bedtime. 30 tablet 0   umeclidinium-vilanterol (ANORO ELLIPTA) 62.5-25 MCG/ACT AEPB Inhale 1 puff into the lungs daily. 30 each 0   vitamin D3 (CHOLECALCIFEROL) 25 MCG tablet Take 1 tablet (1,000 Units total) by mouth daily. 60 tablet 1     Abtx:  Anti-infectives (From admission, onward)    Start     Dose/Rate Route Frequency Ordered Stop   10/12/22 1645  vancomycin (VANCOREADY) IVPB 1500 mg/300 mL       See Hyperspace for full Linked Orders Report.   1,500 mg 150 mL/hr over 120 Minutes Intravenous  Once 10/12/22 1110     10/12/22 1600  ceFEPIme (MAXIPIME) 1 g in sodium chloride 0.9 % 100 mL IVPB        1 g 200 mL/hr over 30 Minutes Intravenous Every 24 hours 10/12/22 1112     10/12/22 1500  vancomycin (VANCOCIN) IVPB 1000 mg/200 mL premix       See Hyperspace for full Linked Orders Report.   1,000 mg 200 mL/hr over 60 Minutes Intravenous  Once 10/12/22 1110     10/12/22 1115  ceFEPIme (MAXIPIME) 1 g in sodium chloride 0.9 % 100 mL IVPB  Status:  Discontinued        1 g 200 mL/hr over 30 Minutes Intravenous  Once 10/12/22 1110 10/12/22 1112       REVIEW OF SYSTEMS:  Const: negative fever, negative chills, negative weight loss Eyes: negative diplopia or visual changes, negative eye pain ENT: negative coryza, negative sore throat Resp: negative cough, hemoptysis,  dyspnea Cards: negative for chest pain, palpitations, lower extremity edema GU: negative for frequency, dysuria and hematuria GI: rt lower quadrant abdominal pain  Skin: negative for rash and pruritus Heme: negative for easy bruising and gum/nose bleeding MS: weakness, not able to walk Neurolo:weakness legs rt > letf Psych: negative for feelings of anxiety, depression  Endocrine: negative for thyroid, diabetes Allergy/Immunology- as above Objective:  VITALS:  BP (!) 165/65 (BP Location: Right Arm)   Pulse 65   Temp 98.1 F (36.7 C) (Oral)   Resp 14   Ht 6' (1.829 m)   Wt 94.4 kg  SpO2 98%   BMI 28.23 kg/m   PHYSICAL EXAM:  General: Alert, cooperative, no distress at rest , pale  Head: Normocephalic, without obvious abnormality, atraumatic. Eyes: Conjunctivae clear, anicteric sclerae. Pupils are equal ENT Nares normal. No drainage or sinus tenderness. Lips, mucosa,N  and tongue whitish coating  Neck:  symmetrical, no adenopathy, thyroid: non tender no carotid bruit and no JVD. Back: did not examine Lungs: Clear to auscultation bilaterally. No Wheezing or Rhonchi. No rales. Heart: Regular rate and rhythm, no murmur, rub or gallop. Abdomen: Soft, non-tender,not distended. Bowel sounds normal. No masses Extremities: left upper extremity AVF Skin: No rashes or lesions. Or bruising Lymph: Cervical, supraclavicular normal. Neurologic: weakness legs Pertinent Labs Lab Results CBC    Component Value Date/Time   WBC 10.2 10/12/2022 0605   RBC 4.07 10/12/2022 0605   HGB 9.6 (L) 10/12/2022 0605   HGB 9.5 (L) 06/17/2014 0538   HCT 35.0 (L) 10/12/2022 0605   HCT 29.6 (L) 06/17/2014 0538   PLT 199 10/12/2022 0605   PLT 173 06/17/2014 0538   MCV 86.0 10/12/2022 0605   MCV 92 06/17/2014 0538   MCH 23.6 (L) 10/12/2022 0605   MCHC 27.4 (L) 10/12/2022 0605   RDW 21.2 (H) 10/12/2022 0605   RDW 16.2 (H) 06/17/2014 0538   LYMPHSABS 1.4 10/12/2022 0605   LYMPHSABS 2.5 06/17/2014  0538   MONOABS 0.9 10/12/2022 0605   MONOABS 1.0 (H) 06/17/2014 0538   EOSABS 0.1 10/12/2022 0605   EOSABS 0.2 06/17/2014 0538   BASOSABS 0.1 10/12/2022 0605   BASOSABS 0.1 06/17/2014 0538       Latest Ref Rng & Units 10/12/2022    6:05 AM 10/05/2022   12:33 PM 09/09/2022    7:20 AM  CMP  Glucose 70 - 99 mg/dL 109  112  144   BUN 8 - 23 mg/dL 24  7  50   Creatinine 0.44 - 1.00 mg/dL 6.68  2.81  8.44   Sodium 135 - 145 mmol/L 138  136  133   Potassium 3.5 - 5.1 mmol/L 3.1  2.9  4.2   Chloride 98 - 111 mmol/L 105  102  97   CO2 22 - 32 mmol/L 24  27  27    Calcium 8.9 - 10.3 mg/dL 9.1  8.5  8.9   Total Protein 6.5 - 8.1 g/dL  7.5    Total Bilirubin 0.3 - 1.2 mg/dL  0.9    Alkaline Phos 38 - 126 U/L  80    AST 15 - 41 U/L  13    ALT 0 - 44 U/L  11        Microbiology: [facility] sent  IMAGING RESULTS: MRI L/S   L4-L5 discitis B/l psoas abscess Epidural phlegmon  I have personally reviewed the films ? Impression/Recommendation Low back pain and leg pain and weakness Lumbar discitis,L4-L5  epidural phlegmon and b/l psoas abscess on MRI Patient will have IR aspiration of psoas abscess tomorrow Will hold antibiotics till then as she is hemodynamically and neurologically stable and normal wbc  ?ESRD on dialysis Recent malfunctioning AVG and underwent fistulogram, angioplasty and stent placement on 09/22/22 Source could be this   Left breast C- s/p lumpectomy and axillary node dissection She could not tolerate chemo  Anemia  ?DM -   HTN on metoprolol ? ___________________________________________________ Discussed with patient, and her daughter in detail requesting provider Note:  This document was prepared using Dragon voice recognition software and may include unintentional dictation errors.

## 2022-10-13 ENCOUNTER — Inpatient Hospital Stay: Payer: 59

## 2022-10-13 DIAGNOSIS — E1169 Type 2 diabetes mellitus with other specified complication: Secondary | ICD-10-CM | POA: Diagnosis not present

## 2022-10-13 DIAGNOSIS — M4620 Osteomyelitis of vertebra, site unspecified: Secondary | ICD-10-CM | POA: Diagnosis not present

## 2022-10-13 DIAGNOSIS — Z794 Long term (current) use of insulin: Secondary | ICD-10-CM

## 2022-10-13 DIAGNOSIS — M4646 Discitis, unspecified, lumbar region: Secondary | ICD-10-CM

## 2022-10-13 LAB — CBC
HCT: 31.3 % — ABNORMAL LOW (ref 36.0–46.0)
Hemoglobin: 9.2 g/dL — ABNORMAL LOW (ref 12.0–15.0)
MCH: 24.2 pg — ABNORMAL LOW (ref 26.0–34.0)
MCHC: 29.4 g/dL — ABNORMAL LOW (ref 30.0–36.0)
MCV: 82.4 fL (ref 80.0–100.0)
Platelets: 221 10*3/uL (ref 150–400)
RBC: 3.8 MIL/uL — ABNORMAL LOW (ref 3.87–5.11)
RDW: 20.9 % — ABNORMAL HIGH (ref 11.5–15.5)
WBC: 7.2 10*3/uL (ref 4.0–10.5)
nRBC: 0 % (ref 0.0–0.2)

## 2022-10-13 LAB — GLUCOSE, CAPILLARY
Glucose-Capillary: 92 mg/dL (ref 70–99)
Glucose-Capillary: 90 mg/dL (ref 70–99)
Glucose-Capillary: 88 mg/dL (ref 70–99)
Glucose-Capillary: 99 mg/dL (ref 70–99)

## 2022-10-13 LAB — BASIC METABOLIC PANEL
Anion gap: 13 (ref 5–15)
BUN: 18 mg/dL (ref 8–23)
CO2: 27 mmol/L (ref 22–32)
Calcium: 8.8 mg/dL — ABNORMAL LOW (ref 8.9–10.3)
Chloride: 95 mmol/L — ABNORMAL LOW (ref 98–111)
Creatinine, Ser: 5.09 mg/dL — ABNORMAL HIGH (ref 0.44–1.00)
GFR, Estimated: 9 mL/min — ABNORMAL LOW (ref >60)
Glucose, Bld: 100 mg/dL — ABNORMAL HIGH (ref 70–99)
Potassium: 3.2 mmol/L — ABNORMAL LOW (ref 3.5–5.1)
Sodium: 135 mmol/L (ref 135–145)

## 2022-10-13 LAB — SEDIMENTATION RATE: Sed Rate: 70 mm/hr — ABNORMAL HIGH (ref 0–30)

## 2022-10-13 LAB — HEPATITIS B SURFACE ANTIBODY, QUANTITATIVE: Hep B S AB Quant (Post): 5.2 m[IU]/mL — ABNORMAL LOW (ref >9.9)

## 2022-10-13 LAB — LACTIC ACID, PLASMA: Lactic Acid, Venous: 0.8 mmol/L (ref 0.5–1.9)

## 2022-10-13 MED ORDER — SODIUM CHLORIDE 0.9 % IV SOLN
1.0000 g | INTRAVENOUS | Status: DC
  Administered 2022-10-13 – 2022-10-16 (×4): 1 g via INTRAVENOUS
  Filled 2022-10-13: qty 10
  Filled 2022-10-13: qty 1
  Filled 2022-10-13: qty 10
  Filled 2022-10-13 (×2): qty 1

## 2022-10-13 MED ORDER — SODIUM CHLORIDE 0.9% FLUSH
10.0000 mL | INTRAVENOUS | Status: DC | PRN
  Administered 2022-10-24 (×2): 10 mL

## 2022-10-13 MED ORDER — HYDROMORPHONE HCL 1 MG/ML IJ SOLN
1.0000 mg | Freq: Once | INTRAMUSCULAR | Status: AC
  Administered 2022-10-13: 1 mg via INTRAVENOUS
  Filled 2022-10-13: qty 1

## 2022-10-13 MED ORDER — LIDOCAINE HCL (PF) 1 % IJ SOLN
10.0000 mL | Freq: Once | INTRAMUSCULAR | Status: AC
  Administered 2022-10-13: 10 mL

## 2022-10-13 MED ORDER — NYSTATIN 100000 UNIT/ML MT SUSP
5.0000 mL | Freq: Four times a day (QID) | OROMUCOSAL | Status: DC
  Administered 2022-10-13 – 2022-11-08 (×74): 500000 [IU] via ORAL
  Filled 2022-10-13 (×78): qty 5

## 2022-10-13 MED ORDER — VANCOMYCIN HCL IN DEXTROSE 1-5 GM/200ML-% IV SOLN
1000.0000 mg | INTRAVENOUS | Status: DC
  Administered 2022-10-14: 1000 mg via INTRAVENOUS
  Filled 2022-10-13 (×4): qty 200

## 2022-10-13 MED ORDER — SODIUM CHLORIDE 0.9% FLUSH
10.0000 mL | Freq: Two times a day (BID) | INTRAVENOUS | Status: DC
  Administered 2022-10-13: 20 mL
  Administered 2022-10-13: 10 mL
  Administered 2022-10-14: 20 mL
  Administered 2022-10-14 – 2022-10-31 (×29): 10 mL
  Administered 2022-10-31: 20 mL
  Administered 2022-11-01: 10 mL
  Administered 2022-11-01: 20 mL
  Administered 2022-11-02: 10 mL
  Administered 2022-11-02: 20 mL
  Administered 2022-11-03 – 2022-11-14 (×22): 10 mL
  Administered 2022-11-14: 20 mL
  Administered 2022-11-15 – 2022-11-16 (×2): 10 mL
  Administered 2022-11-16 – 2022-11-17 (×2): 20 mL
  Administered 2022-11-17 – 2022-11-18 (×2): 10 mL
  Administered 2022-11-18: 20 mL
  Administered 2022-11-19 (×2): 10 mL
  Administered 2022-11-20: 20 mL
  Administered 2022-11-20: 10 mL
  Administered 2022-11-21: 20 mL
  Administered 2022-11-21: 10 mL
  Administered 2022-11-22: 20 mL
  Administered 2022-11-22 – 2022-12-04 (×23): 10 mL
  Administered 2022-12-05: 20 mL
  Administered 2022-12-05: 10 mL
  Administered 2022-12-06: 20 mL
  Administered 2022-12-06 – 2022-12-07 (×2): 10 mL
  Administered 2022-12-07: 20 mL
  Administered 2022-12-08: 10 mL
  Administered 2022-12-08: 20 mL
  Administered 2022-12-09 – 2022-12-12 (×8): 10 mL
  Administered 2022-12-12 – 2022-12-13 (×2): 20 mL
  Administered 2022-12-13 – 2022-12-15 (×5): 10 mL
  Administered 2022-12-16: 30 mL
  Administered 2022-12-16 – 2022-12-19 (×6): 10 mL

## 2022-10-13 MED ORDER — VANCOMYCIN HCL 2000 MG/400ML IV SOLN
2000.0000 mg | Freq: Once | INTRAVENOUS | Status: AC
  Administered 2022-10-13: 2000 mg via INTRAVENOUS
  Filled 2022-10-13: qty 400

## 2022-10-13 NOTE — Consult Note (Signed)
Pharmacy Antibiotic Note  Cynthia Dean is a 72 y.o. female admitted on 10/12/2022 with  discitis and possible osteomyelitis .  Pharmacy has been consulted for Vancomycin and Cefepime dosing. Of note, patient receives hemodialysis MWF outpatient. Plan is to continue regimen while inpatient.  Plan: Vancomycin 2g IV x 1 as loading dose Will order Vancomycin 1g IV qHD session(currently set as MWF) Will order levels when appropriate  Cefepime 1g Q24 hours, per  Antibiotic Protocol for hemodialysis  Height: 6' (182.9 cm) Weight: 94.4 kg (208 lb 1.8 oz) IBW/kg (Calculated) : 73.1  Temp (24hrs), Avg:98.2 F (36.8 C), Min:98 F (36.7 C), Max:98.4 F (36.9 C)  Recent Labs  Lab 10/12/22 0605 10/12/22 1740 10/13/22 0006 10/13/22 1146  WBC 10.2  --   --  7.2  CREATININE 6.68*  --   --  5.09*  LATICACIDVEN  --  1.5 0.8  --     Estimated Creatinine Clearance: 13.1 mL/min (A) (by C-G formula based on SCr of 5.09 mg/dL (H)).    Allergies  Allergen Reactions   Oxycodone Nausea And Vomiting   Oxycodone-Acetaminophen Other (See Comments)   Wound Dressing Adhesive    Hydrocodone     Intolerant more than allergic   Tape Itching    Skin Dermatitis/itching (tape adhesive) Skin Dermatitis/itching (tape adhesive)   Tapentadol Itching    Skin Dermatitis/itching (tape adhesive) Skin Dermatitis/itching (tape adhesive)     Antimicrobials this admission: Vancomycin 3/21 >>  Cefepime 3/21 >>   Dose adjustments this admission: N/A  Microbiology results: 3/20 BCx: collected 3/20 anaerobic cx: collected 3/21 MRSA PCR: pending  Thank you for allowing pharmacy to be a part of this patient's care.  Pearla Dubonnet 10/13/2022 12:39 PM

## 2022-10-13 NOTE — Consult Note (Signed)
Consult requested by:  Dr. Jori Moll  Consult requested for:  discitis  Primary Physician:  Lowella Bandy, MD  History of Present Illness: 10/13/2022 Cynthia Dean is here today with a chief complaint of discitis. she has a complex medical history including diabetes mellitus, COPD, end-stage renal disease on dialysis.  Her daughter reported that she has been having worsening back pain over the past several weeks.  She has been having trouble walking for longer than that and has been essentially nonambulatory for weeks to months.  She is able to take single steps at times.  She is having significant pain down her legs.  She is able to feel her perineum.  She denies numbness in the legs.  The symptoms are causing a significant impact on the patient's life.   I have utilized the care everywhere function in epic to review the outside records available from external health systems.  She is a poor historian so her daughter provides most of the history.  Review of Systems:  A 10 point review of systems is negative, except for the pertinent positives and negatives detailed in the HPI.  Past Medical History: Past Medical History:  Diagnosis Date   Acute on chronic respiratory failure with hypoxia ([place]) 07/30/2019   Acute respiratory failure with hypoxia ([place]) 12/31/2018   Carotid arterial disease ([place])    a. 02/2018 < bilat ICA stenosis.   COPD (chronic obstructive pulmonary disease) ([facility])    Coronary artery disease    a. 11/2015 MV: EF 57%, no ischemia/infarct.   Cryptogenic stroke ([place])    a. 10/2017 s/p implantable loop recorder (no Afib to date).   Diabetes mellitus without complication ([place])    Diarrhea    ESRD on dialysis [facility])    GI bleed    a. 01/2019 EGD/Colonoscopy: Mild gastritis. Colitis.   Heart murmur    a. 12/2018 Echo: EF 55-60%, impaired relaxation. Mod dil LA. Mildly dil RA. Mild Ca2+ of AoV.   Hematemesis 06/27/2017   History of 2019 novel coronavirus disease  (COVID-19) 07/30/2019   Hyperkalemia    Hyperlipidemia    Hypertension    Intractable nausea and vomiting 08/08/2015   Nausea & vomiting 05/30/2018   Nausea and vomiting 05/29/2018   Pneumonia due to COVID-19 virus     Past Surgical History: Past Surgical History:  Procedure Laterality Date   A/V FISTULAGRAM Left 12/20/2016   Procedure: A/V Fistulagram;  Surgeon: Katha Cabal, MD;  Location: [place] [facility];  Service: Cardiovascular;  Laterality: Left;   A/V FISTULAGRAM Left 09/22/2022   Procedure: A/V Fistulagram;  Surgeon: Algernon Huxley, MD;  Location: [place] [facility];  Service: Cardiovascular;  Laterality: Left;   A/V SHUNT INTERVENTION N/A 12/20/2016   Procedure: A/V Shunt Intervention;  Surgeon: Katha Cabal, MD;  Location: [place] [facility];  Service: Cardiovascular;  Laterality: N/A;   A/V SHUNTOGRAM Left 09/11/2017   Procedure: A/V SHUNTOGRAM;  Surgeon: Algernon Huxley, MD;  Location: [place] [facility];  Service: Cardiovascular;  Laterality: Left;   A/V SHUNTOGRAM Left 11/21/2019   Procedure: A/V SHUNTOGRAM;  Surgeon: Algernon Huxley, MD;  Location: [place] [facility];  Service: Cardiovascular;  Laterality: Left;   BREAST BIOPSY Bilateral 07/19/2000   neg   BREAST BIOPSY Left 11/03/2021   [place] bx mass at 3:00, venus marker, axilla bx-hyrdo marker, path pending   COLONOSCOPY WITH PROPOFOL N/A 02/16/2019   Procedure: COLONOSCOPY WITH PROPOFOL;  Surgeon: [place], Mecca  K, MD;  Location: [facility];  Service: Gastroenterology;  Laterality: N/A;   COLONOSCOPY WITH PROPOFOL N/A 08/31/2022   Procedure: COLONOSCOPY WITH PROPOFOL;  Surgeon: Annamaria Helling, DO;  Location: [facility];  Service: Gastroenterology;  Laterality: N/A;   ESOPHAGOGASTRODUODENOSCOPY (EGD) WITH PROPOFOL N/A 02/16/2019   Procedure: ESOPHAGOGASTRODUODENOSCOPY (EGD) WITH PROPOFOL;  Surgeon: [place], Benay Pike, MD;  Location: [facility];  Service: Gastroenterology;  Laterality: N/A;    ESOPHAGOGASTRODUODENOSCOPY (EGD) WITH PROPOFOL N/A 08/28/2022   Procedure: ESOPHAGOGASTRODUODENOSCOPY (EGD) WITH PROPOFOL;  Surgeon: Annamaria Helling, DO;  Location: [place];  Service: Gastroenterology;  Laterality: N/A;   IR US GUIDE BX ASP/DRAIN  03/25/2022   LOOP RECORDER INSERTION N/A 11/16/2017   Procedure: LOOP RECORDER INSERTION;  Surgeon: Deboraha Sprang, MD;  Location: [place] [facility];  Service: Cardiovascular;  Laterality: N/A;   PERIPHERAL VASCULAR CATHETERIZATION Left 02/02/2015   Procedure: A/V Shuntogram/Fistulagram;  Surgeon: Algernon Huxley, MD;  Location: [place] [facility];  Service: Cardiovascular;  Laterality: Left;   PERIPHERAL VASCULAR CATHETERIZATION Left 02/02/2015   Procedure: A/V Shunt Intervention;  Surgeon: Algernon Huxley, MD;  Location: [place] [facility];  Service: Cardiovascular;  Laterality: Left;   PERIPHERAL VASCULAR CATHETERIZATION Left 03/09/2015   Procedure: A/V Shuntogram/Fistulagram;  Surgeon: Algernon Huxley, MD;  Location: [place] [facility];  Service: Cardiovascular;  Laterality: Left;   PERIPHERAL VASCULAR CATHETERIZATION N/A 03/09/2015   Procedure: A/V Shunt Intervention;  Surgeon: Algernon Huxley, MD;  Location: [place] [facility];  Service: Cardiovascular;  Laterality: N/A;   PERIPHERAL VASCULAR CATHETERIZATION Left 04/17/2015   Procedure: Upper Extremity Angiography;  Surgeon: Algernon Huxley, MD;  Location: [place] [facility];  Service: Cardiovascular;  Laterality: Left;   PERIPHERAL VASCULAR CATHETERIZATION  04/17/2015   Procedure: Upper Extremity Intervention;  Surgeon: Algernon Huxley, MD;  Location: [place] [facility];  Service: Cardiovascular;;   PERIPHERAL VASCULAR CATHETERIZATION N/A 08/10/2015   Procedure: A/V Shuntogram/Fistulagram;  Surgeon: Algernon Huxley, MD;  Location: [place] [facility];  Service: Cardiovascular;  Laterality: N/A;   PERIPHERAL VASCULAR CATHETERIZATION N/A 08/10/2015   Procedure: A/V Shunt Intervention;  Surgeon:  Algernon Huxley, MD;  Location: [place] [facility];  Service: Cardiovascular;  Laterality: N/A;   PERIPHERAL VASCULAR CATHETERIZATION Left 04/11/2016   Procedure: A/V Shuntogram/Fistulagram;  Surgeon: Algernon Huxley, MD;  Location: [place] [facility];  Service: Cardiovascular;  Laterality: Left;   TEE WITHOUT CARDIOVERSION N/A 11/16/2017   Procedure: TRANSESOPHAGEAL ECHOCARDIOGRAM (TEE);  Surgeon: Minna Merritts, MD;  Location: [facility];  Service: Cardiovascular;  Laterality: N/A;    Allergies: Allergies as of 10/12/2022 - Review Complete 10/12/2022  Allergen Reaction Noted   Oxycodone Nausea And Vomiting 08/20/2019   Oxycodone-acetaminophen Other (See Comments) 08/22/2021   Wound dressing adhesive  08/22/2021   Hydrocodone  02/13/2019   Tape Itching 09/16/2016   Tapentadol Itching 09/16/2016    Medications: Current Meds  Medication Sig   acetaminophen (TYLENOL) 500 MG tablet Take 1 tablet (500 mg total) by mouth every 6 (six) hours as needed.   aspirin EC 81 MG tablet Take 1 tablet (81 mg total) by mouth daily. Swallow whole.   atorvastatin (LIPITOR) 80 MG tablet Take 1 tablet (80 mg total) by mouth every evening.   BREO ELLIPTA 100-25 MCG/ACT AEPB Inhale 1 puff into the lungs daily.   cinacalcet (SENSIPAR) 30 MG tablet Take 1 tablet (30 mg total) by mouth daily with breakfast.   cyclobenzaprine (FLEXERIL) 10  MG tablet Take 1 tablet (10 mg total) by mouth 3 (three) times daily as needed for muscle spasms.   diclofenac Sodium (VOLTAREN) 1 % GEL Apply 2 g topically 4 (four) times daily.   diphenhydrAMINE (BENADRYL) 25 MG tablet Take 25 mg by mouth at bedtime as needed.   diphenoxylate-atropine (LOMOTIL) 2.5-0.025 MG tablet SMARTSIG:1 Tablet(s) By Mouth Every 12 Hours PRN   epoetin alfa (EPOGEN) 10000 UNIT/ML injection Inject 1 mL (10,000 Units total) into the vein every Monday, Wednesday, and Friday with hemodialysis.   fluticasone (FLONASE) 50 MCG/ACT nasal spray Place 1 spray into  both nostrils daily.   gabapentin (NEURONTIN) 100 MG capsule Take 1 capsule (100 mg total) by mouth 2 (two) times daily. Additional capsule as needed once daily for pain, total maximum daily dose 3 capsules (300 mg)   glucose 4 GM chewable tablet Chew 1 tablet (4 g total) by mouth as needed for low blood sugar.   insulin NPH-regular Human (NOVOLIN 70/30) (70-30) 100 UNIT/ML injection Inject 28 Units into the skin 2 (two) times daily with a meal.   levETIRAcetam (KEPPRA) 1000 MG tablet Take 1 tablet (1,000 mg total) by mouth daily.   lidocaine (LIDODERM) 5 % Place 1 patch onto the skin daily. Remove & Discard patch within 12 hours or as directed by MD   losartan (COZAAR) 100 MG tablet Take 1 tablet (100 mg total) by mouth at bedtime.   metoprolol succinate (TOPROL-XL) 100 MG 24 hr tablet Take 1 tablet (100 mg total) by mouth daily.   omeprazole (PRILOSEC) 20 MG capsule Take 1 capsule (20 mg total) by mouth 2 (two) times daily.   polyethylene glycol (MIRALAX / GLYCOLAX) 17 g packet Take 17 g by mouth daily.   senna-docusate (SENOKOT-S) 8.6-50 MG tablet Take 2 tablets by mouth at bedtime as needed for mild constipation or moderate constipation.   sertraline (ZOLOFT) 25 MG tablet Take 1 tablet (25 mg total) by mouth daily.   torsemide (DEMADEX) 100 MG tablet Take 1 tablet (100 mg total) by mouth daily.   traMADol (ULTRAM) 50 MG tablet Take 50 mg by mouth 2 (two) times daily.   traZODone (DESYREL) 50 MG tablet Take 1 tablet (50 mg total) by mouth at bedtime.   umeclidinium-vilanterol (ANORO ELLIPTA) 62.5-25 MCG/ACT AEPB Inhale 1 puff into the lungs daily.   vitamin D3 (CHOLECALCIFEROL) 25 MCG tablet Take 1 tablet (1,000 Units total) by mouth daily.    Social History: Social History   Tobacco Use   Smoking status: Former    Packs/day: 0    Types: Cigarettes   Smokeless tobacco: Never  Vaping Use   Vaping Use: Never used  Substance Use Topics   Alcohol use: No   Drug use: No    Family  Medical History: Family History  Problem Relation Age of Onset   Breast cancer Father 54   Stroke Mother    Heart attack Mother    Heart Problems Sister    Breast cancer Cousin 18       1 st cousin. Maternal     Physical Examination: Vitals:   10/12/22 1959 10/13/22 0407  BP: (!) 153/68 (!) 146/62  Pulse: 66 66  Resp: 18 18  Temp: 98 F (36.7 C) 98 F (36.7 C)  SpO2: 91% 98%    General: Patient is well developed, well nourished, calm, collected, and in no apparent distress. Attention to examination is appropriate.  Neck:   Supple.  Full range of motion.  Respiratory:  Patient is breathing without any difficulty.  She is on nasal cannula.   NEUROLOGICAL:     Awake, alert, oriented to person, place, and "2023".  Speech is slow but fluent.  Cranial Nerves: Pupils equal round and reactive to light.  Facial tone is symmetric.  Facial sensation is symmetric. Shoulder shrug is symmetric. Tongue protrusion is midline.   Strength: Side Biceps Triceps Deltoid Interossei Grip Wrist Ext. Wrist Flex.  R 5 5 5 5 5 5 5   L 5 5 5 5 5 5 5    Side Iliopsoas Quads Hamstring PF DF EHL  R 1 2 2 3 3 3   L 1 3 3  4- 4- 4-    Bilateral upper and lower extremity sensation is intact to light touch.    No evidence of dysmetria noted.  Gait is untested.     Medical Decision Making  Imaging: MRI L spine 10/12/2022 IMPRESSION: 1. Findings concerning for discitis/osteomyelitis at L4-L5 with suspected intradiscal abscess. 2. 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 3. Circumferential epidural phlegmon within the spinal canal at L4-L5 extending into the sacrum, thickest along the L5 vertebral body where it measures up to 5 mm. No defined epidural abscess and no high grade spinal canal stenosis 4. Enhancement and edema about the bilateral facet joints at L4-L5 is suspicious for septic arthritis, more convincing on the left. 5.  Multilevel degenerative changes as above.     Electronically Signed   By: Valetta Mole M.D.   On: 10/12/2022 10:21  I have personally reviewed the images and agree with the above interpretation.  Assessment and Plan: Ms. Londo is a pleasant 72 y.o. 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  This is consistent with her exam, which shows worsening ability to move her right leg versus her left.  She has not walked in at least 3 weeks but possibly longer.  She does not have cauda equina dysfunction at this time.  She is a poor surgical candidate given her multiple medical problems.  Additionally, she has not walked in at least 3 weeks per her daughter.  As such, recovery of ambulation may not be achievable with surgical intervention.  I do not think surgery is likely to improve her outcome in this situation.  - Antibiotics per ID - IR for aspiration - PTOT when able    I have communicated my recommendations to the requesting physician and coordinated care to facilitate these recommendations.     Quinten Allerton K. Izora Ribas MD, [facility] Neurosurgery

## 2022-10-13 NOTE — Progress Notes (Signed)
PROGRESS NOTE    Cynthia Dean  [id number] DOB: 09/14/1950 DOA: 10/12/2022 PCP: Lowella Bandy, MD    Assessment & Plan:   Principal Problem:   Back pain Active Problems:   Anemia   ESRD on dialysis [facility])   Essential hypertension   Dyslipidemia   Seizure disorder ([place])   Diabetic retinopathy without macular edema associated with type 2 diabetes mellitus (HCC)   GERD (gastroesophageal reflux disease)   Diabetic neuropathy (HCC)   Anemia of chronic disease   Insulin-requiring or dependent type II diabetes mellitus ([place])   Discitis   Myositis  Assessment and Plan: No notes have been filed under this hospital service. Service: Hospitalist  Possible Discitis/osteomyelitis at L4-L5: w/ suspected intradiscal abscess as per MRI. S/p CT aspiration of right psoas muscle abscess yielding 51mL of purulent material & cx is pending. Started on IV vanco, cefepime. Blood cultures are pending. ID consulted. Neuro surg following and recs apprec    Possible Infectious myositis: of the adjacent psoas muscles with bilateral psoas abscesses, worse on the right as per MRI. Started on IV vanco, cefepime. ID consulted    ESRD: on HD MWF. Nephro following and recs apprec  Generalized weakness: will consult PT/OT when clinically appropriate   ACD: likely secondary to ESRD. H&H are stable    Hypokalemia: managed w/ HD.    Hx of seizures: continue on home dose of keppra   HLD: continue on statin  Hx of COPD: w/o exacerbation. Continue on bronchodilators    DM2: likely poorly controlled. Continue on SSI w/ accuchecks. HbA1c ordered    Peripheral neuropathy: likely secondary to DM2. Continue on home dose of gabapentin    HTN: will restart home dose of metoprolol    GERD: continue on PPI   Depression: severity unknown. Continue on home dose of sertraline    Overweight: BMI 28.2. Would benefit from weight loss         DVT prophylaxis: SCDs Code Status: full  Family  Communication: discussed pt's care w/ pt's daughter at bedside and answered her questions  Disposition Plan: depends on PT/OT recs (not consulted yet)  Level of care: Med-Surg Status is: Inpatient Remains inpatient appropriate because: severity of illness    Consultants:  ID IR Neuro surg   Procedures:   Antimicrobials:   Subjective: Pt c/o back pain   Objective: Vitals:   10/12/22 1811 10/12/22 1959 10/13/22 0407 10/13/22 0812  BP: (!) 169/58 (!) 153/68 (!) 146/62 (!) 156/67  Pulse: 65 66 66 68  Resp: 16 18 18 18   Temp: 98.3 F (36.8 C) 98 F (36.7 C) 98 F (36.7 C) 98.4 F (36.9 C)  TempSrc:      SpO2: 100% 91% 98%   Weight:      Height:        Intake/Output Summary (Last 24 hours) at 10/13/2022 1328 Last data filed at 10/13/2022 1323 Gross per 24 hour  Intake 153.01 ml  Output 1.5 ml  Net 151.51 ml   Filed Weights   10/12/22 0555 10/12/22 1052 10/12/22 1440  Weight: 113.4 kg 96 kg 94.4 kg    Examination:  General exam: Appears uncomfortable  Respiratory system: decreased breath sounds b/l Cardiovascular system: S1 & S2 +. No rubs, gallops or clicks.  Gastrointestinal system: Abdomen is obese, soft and nontender. . Normal bowel sounds heard. Central nervous system: Alert and awake. Moves all extremities  Psychiatry: Judgement and insight appears poor. Flat mood and affect    Data Reviewed:  I have personally reviewed following labs and imaging studies  CBC: Recent Labs  Lab 10/12/22 0605 10/13/22 1146  WBC 10.2 7.2  NEUTROABS 7.6  --   HGB 9.6* 9.2*  HCT 35.0* 31.3*  MCV 86.0 82.4  PLT 199 [id number]   Basic Metabolic Panel: Recent Labs  Lab 10/12/22 0605 10/13/22 1146  NA 138 135  K 3.1* 3.2*  CL 105 95*  CO2 24 27  GLUCOSE 109* 100*  BUN 24* 18  CREATININE 6.68* 5.09*  CALCIUM 9.1 8.8*   GFR: Estimated Creatinine Clearance: 13.1 mL/min (A) (by C-G formula based on SCr of 5.09 mg/dL (H)). Liver Function Tests: No results for  input(s): "AST", "ALT", "ALKPHOS", "BILITOT", "PROT", "ALBUMIN" in the last 168 hours. No results for input(s): "LIPASE", "AMYLASE" in the last 168 hours. No results for input(s): "AMMONIA" in the last 168 hours. Coagulation Profile: No results for input(s): "INR", "PROTIME" in the last 168 hours. Cardiac Enzymes: No results for input(s): "CKTOTAL", "CKMB", "CKMBINDEX", "TROPONINI" in the last 168 hours. BNP (last 3 results) No results for input(s): "PROBNP" in the last 8760 hours. HbA1C: No results for input(s): "HGBA1C" in the last 72 hours. CBG: Recent Labs  Lab 10/12/22 2001 10/13/22 0811 10/13/22 1205  GLUCAP 92 92 90   Lipid Profile: No results for input(s): "CHOL", "HDL", "LDLCALC", "TRIG", "CHOLHDL", "LDLDIRECT" in the last 72 hours. Thyroid Function Tests: No results for input(s): "TSH", "T4TOTAL", "FREET4", "T3FREE", "THYROIDAB" in the last 72 hours. Anemia Panel: No results for input(s): "VITAMINB12", "FOLATE", "FERRITIN", "TIBC", "IRON", "RETICCTPCT" in the last 72 hours. Sepsis Labs: Recent Labs  Lab 10/12/22 1740 10/13/22 0006  LATICACIDVEN 1.5 0.8    No results found for this or any previous visit (from the past 240 hour(s)).       Radiology Studies: CT ASPIRATION N/S  Result Date: 10/13/2022 INDICATION: Right psoas abscess EXAM: CT-guided aspiration of right psoas abscess MEDICATIONS: Documented in the EMR ANESTHESIA/SEDATION: Local analgesia COMPLICATIONS: None immediate. PROCEDURE: Informed written consent was obtained from the patient after a thorough discussion of the procedural risks, benefits and alternatives. All questions were addressed. Maximal Sterile Barrier Technique was utilized including caps, mask, sterile gowns, sterile gloves, sterile drape, hand hygiene and skin antiseptic. A timeout was performed prior to the initiation of the procedure. The patient was placed prone on the exam table. Limited CT of the abdomen and pelvis was performed for  planning purposes. This demonstrated small fluid collection in the right psoas muscle. Skin entry site was marked, and the overlying skin was prepped and draped in the standard sterile fashion. Local analgesia was obtained with 1% lidocaine. Using intermittent CT fluoroscopy, a 19 gauge Yueh catheter was advanced into the identified small fluid collection in the right psoas muscle. Approximately 2 mL of purulent fluid was aspirated. This was sent to the lab for microbiology analysis. Needle was removed, and a clean dressing was placed. The patient tolerated the procedure well without immediate complication. IMPRESSION: Successful CT-guided aspiration of right psoas abscess, yielding 2 mL of purulent fluid. Sample sent for microbiology analysis. Electronically Signed   By: Albin Felling M.D.   On: 10/13/2022 11:56   MR Lumbar Spine W Wo Contrast  Result Date: 10/12/2022 CLINICAL DATA:  Bilateral leg pain for 3 weeks. EXAM: MRI LUMBAR SPINE WITHOUT AND WITH CONTRAST TECHNIQUE: Multiplanar and multiecho pulse sequences of the lumbar spine were obtained without and with intravenous contrast. CONTRAST:  41mL GADAVIST GADOBUTROL 1 MMOL/ML IV SOLN COMPARISON:  CT abdomen/pelvis  06/27/2022, lumbar spine MRI 05/11/2022 FINDINGS: Segmentation: Standard; the lowest formed disc space is designated L5-S1. Alignment:  Normal. Vertebrae: Vertebral body heights are preserved. Background marrow signal is normal There is abnormal T1 hypointensity with edema and postcontrast enhancement of the L4 and L5 vertebral bodies along the disc space. There is fluid within the disc space with abnormal enhancement at the periphery of the disc bilaterally suspicious for intradiscal abscess. Findings concerning for discitis/osteomyelitis. There is circumferential enhancing material within the spinal canal at L4 extending into the sacrum likely reflecting epidural phlegmon, thickest along the L5 vertebral body where it measures up to 5 mm  there is no defined abscess. There is STIR signal abnormality and enhancement about the bilateral facet joints at this level suspicious for septic arthritis, more convincing on the left (13-28, 29, 12-13, 7). There is no other abnormal marrow edema or enhancement. Conus medullaris and cauda equina: Conus extends to the L1-L2 level. Conus and cauda equina appear normal. There is no abnormal enhancement of the conus or cauda equina nerve roots. Paraspinal and other soft tissues: There is a edema and enhancement in the bilateral psoas muscles consistent with infectious myositis. There is a multiloculated abscess in the right psoas muscle measuring up to approximately 1.7 cm AP x 1.4 cm TV by 2.5 cm cc (13-28). On the left, there is smaller abscess measuring 0.9 cm x 0.8 cm by 1.0 cm (13-26, 12-15). Bilateral renal cysts are noted. Disc levels: There is disc desiccation and narrowing most advanced at L5-S1 with overall mild disc degeneration at the other levels. T12-L1: No significant spinal canal or neural foraminal stenosis. L1-L2: No significant spinal canal or neural foraminal stenosis. L2-L3: There is a diffuse disc bulge and left worse than right facet arthropathy with 6 mm nodular T2 hypointense lesion projecting medially from the left facet joint favored to reflect a complex synovial cyst. Findings again resulting in mild spinal canal stenosis with left subarticular zone narrowing and mild bilateral neural foraminal stenosis, unchanged. L3-L4: There is a mild disc bulge and moderate bilateral facet arthropathy with ligamentum flavum thickening resulting in moderate bilateral neural foraminal stenosis without significant spinal canal stenosis, unchanged. L4-L5: There is a disc bulge and moderate bilateral facet arthropathy superimposed on acute infectious findings detailed above resulting in mild-to-moderate spinal canal stenosis with bilateral subarticular zone narrowing and moderate bilateral neural foraminal  stenosis. L5-S1: There is a diffuse disc bulge with a central annular fissure and mild bilateral facet arthropathy resulting in bilateral subarticular zone narrowing and moderate bilateral neural foraminal stenosis. IMPRESSION: 1. Findings concerning for discitis/osteomyelitis at L4-L5 with suspected intradiscal abscess. 2. 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 3. Circumferential epidural phlegmon within the spinal canal at L4-L5 extending into the sacrum, thickest along the L5 vertebral body where it measures up to 5 mm. No defined epidural abscess and no high grade spinal canal stenosis 4. Enhancement and edema about the bilateral facet joints at L4-L5 is suspicious for septic arthritis, more convincing on the left. 5. Multilevel degenerative changes as above. Electronically Signed   By: Valetta Mole M.D.   On: 10/12/2022 10:21   US Venous Img Lower Bilateral  Result Date: 10/12/2022 CLINICAL DATA:  72 year old female with severe bilateral leg pain. EXAM: BILATERAL LOWER EXTREMITY VENOUS DOPPLER ULTRASOUND TECHNIQUE: Gray-scale sonography with compression, as well as color and duplex ultrasound, were performed to evaluate the deep venous system(s) from the level of the common femoral  vein through the popliteal and proximal calf veins. COMPARISON:  03/20/2022 and earlier. FINDINGS: VENOUS Normal compressibility of the bilateral common femoral, superficial femoral, and popliteal veins, as well as the visualized calf veins. Visualized portions of the bilateral profunda femoral vein and great saphenous vein unremarkable. No filling defects to suggest DVT on grayscale or color Doppler imaging. Doppler waveforms show normal direction of venous flow, normal respiratory plasticity and response to augmentation. OTHER None. Limitations: none IMPRESSION: No evidence of bilateral lower extremity deep venous thrombosis. Electronically Signed   By: Genevie Ann M.D.    On: 10/12/2022 07:10        Scheduled Meds:  atorvastatin  80 mg Oral QHS   Chlorhexidine Gluconate Cloth  6 each Topical Q0600   gabapentin  100 mg Oral BID   insulin aspart  0-9 Units Subcutaneous TID WC   levETIRAcetam  1,000 mg Oral Daily   levETIRAcetam  250 mg Oral Q M,W,F   losartan  100 mg Oral Daily   metoprolol succinate  100 mg Oral Daily   pantoprazole  20 mg Oral BID   sertraline  25 mg Oral Daily   sodium chloride flush  10-40 mL Intracatheter Q12H   sodium chloride flush  3 mL Intravenous Q12H   umeclidinium-vilanterol  1 puff Inhalation Daily   Continuous Infusions:  anticoagulant sodium citrate     ceFEPime (MAXIPIME) IV     vancomycin 200 mL/hr at 10/13/22 1323     LOS: 1 day    Time spent: 35 mins     Wyvonnia Dusky, MD [facility] Pager [phone]  If 7PM-7AM, please contact night-coverage [email or web address] 10/13/2022, 1:28 PM

## 2022-10-13 NOTE — Progress Notes (Signed)
IR received request for right psoas abscess aspiration.   The case and imaging was reviewed today with my attending, Dr. Denna Haggard who feels this collection is too small for drain placement but amendable to aspiration, labs and vitals have been reviewed.  Risks and benefits of CT guided psoas abscess aspiration was discussed with the patient and her daughter including, but not limited to bleeding, infection, damage to adjacent structures or low yield requiring additional tests.  All of the questions were answered and there is agreement to proceed.  Consent signed and in chart.  Tsosie Billing D, PA-C 10/13/2022, 9:30 AM

## 2022-10-13 NOTE — Progress Notes (Signed)
[facility]  ROUNDING NOTE   Subjective:   Cynthia Dean is a 72 y.o. 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 She presents to the ED with worsening lower extremity pain. Patient will be admitted for Back pain [M54.9] Bilateral leg pain [M79.604, M79.605]  Patient is known to our practice and receives outpatient dialysis treatments at [facility] on a MWF schedule, supervised by Dr Candiss Norse.   Patient seen resting in bed Daughter at bedside Reports some pain in lower extremities  Patient seen later in morning with attending, after IR procedure Reports increased pain in lower extremities   Objective:  Vital signs in last 24 hours:  Temp:  [98 F (36.7 C)-98.4 F (36.9 C)] 98.4 F (36.9 C) (03/21 0812) Pulse Rate:  [64-68] 68 (03/21 0812) Resp:  [12-18] 18 (03/21 0812) BP: (146-171)/(58-74) 156/67 (03/21 0812) SpO2:  [91 %-100 %] 98 % (03/21 0407) Weight:  [94.4 kg] 94.4 kg (03/20 1440)  Weight change: -17.4 kg Filed Weights   10/12/22 0555 10/12/22 1052 10/12/22 1440  Weight: 113.4 kg 96 kg 94.4 kg    Intake/Output: I/O last 3 completed shifts: In: -  Out: 1.5 [Other:1.5]   Intake/Output this shift:  No intake/output data recorded.  Physical Exam: General: NAD, resting comfortably  Head: Normocephalic, atraumatic. Moist oral mucosal membranes  Eyes: Anicteric  Lungs:  Clear to auscultation, normal effort  Heart: Regular rate and rhythm  Abdomen:  Soft, nontender  Extremities: No peripheral edema.  Neurologic: Alert and oriented to self, moving all four extremities  Skin: No lesions  Access: Left AVG    Basic Metabolic Panel: Recent Labs  Lab 10/12/22 0605 10/13/22 1146  NA 138 135  K 3.1* 3.2*  CL 105 95*  CO2 24 27  GLUCOSE 109* 100*  BUN 24* 18  CREATININE 6.68* 5.09*  CALCIUM 9.1 8.8*      Liver Function Tests: No results for input(s): "AST", "ALT", "ALKPHOS", "BILITOT", "PROT", "ALBUMIN" in the last 168 hours. No results for input(s): "LIPASE", "AMYLASE" in the last 168 hours. No results for input(s): "AMMONIA" in the last 168 hours.  CBC: Recent Labs  Lab 10/12/22 0605 10/13/22 1146  WBC 10.2 7.2  NEUTROABS 7.6  --   HGB 9.6* 9.2*  HCT 35.0* 31.3*  MCV 86.0 82.4  PLT 199 221     Cardiac Enzymes: No results for input(s): "CKTOTAL", "CKMB", "CKMBINDEX", "TROPONINI" in the last 168 hours.  BNP: Invalid input(s): "POCBNP"  CBG: Recent Labs  Lab 10/12/22 2001 10/13/22 0811 10/13/22 1205  GLUCAP 92 92 90    Microbiology: Results for orders placed or performed during the hospital encounter of 08/26/22  MRSA Next Gen by PCR, Nasal     Status: None   Collection Time: 08/28/22  4:20 PM   Specimen: Nasal Mucosa; Nasal Swab  Result Value Ref Range Status   MRSA by PCR Next Gen NOT DETECTED NOT DETECTED Final    Comment: (NOTE) The GeneXpert MRSA Assay (FDA approved for NASAL specimens only), is one component of a comprehensive MRSA colonization surveillance program. It is not intended to diagnose MRSA infection nor to guide or monitor treatment for MRSA infections. Test performance is not FDA approved in patients less than 106 years old. Performed at [facility], [address], [place], [place] [address]   Gastrointestinal Panel by PCR , Stool     Status:  None   Collection Time: 08/29/22  3:44 PM   Specimen: Stool  Result Value Ref Range Status   Campylobacter species NOT DETECTED NOT DETECTED Final   Plesimonas shigelloides NOT DETECTED NOT DETECTED Final   Salmonella species NOT DETECTED NOT DETECTED Final   Yersinia enterocolitica NOT DETECTED NOT DETECTED Final   Vibrio species NOT DETECTED NOT DETECTED Final   Vibrio cholerae NOT DETECTED NOT DETECTED Final   Enteroaggregative E coli (EAEC) NOT DETECTED NOT DETECTED Final    Enteropathogenic E coli (EPEC) NOT DETECTED NOT DETECTED Final   Enterotoxigenic E coli (ETEC) NOT DETECTED NOT DETECTED Final   Shiga like toxin producing E coli (STEC) NOT DETECTED NOT DETECTED Final   Shigella/Enteroinvasive E coli (EIEC) NOT DETECTED NOT DETECTED Final   Cryptosporidium NOT DETECTED NOT DETECTED Final   Cyclospora cayetanensis NOT DETECTED NOT DETECTED Final   Entamoeba histolytica NOT DETECTED NOT DETECTED Final   Giardia lamblia NOT DETECTED NOT DETECTED Final   Adenovirus F40/41 NOT DETECTED NOT DETECTED Final   Astrovirus NOT DETECTED NOT DETECTED Final   Norovirus GI/GII NOT DETECTED NOT DETECTED Final   Rotavirus A NOT DETECTED NOT DETECTED Final   Sapovirus (I, II, IV, and V) NOT DETECTED NOT DETECTED Final    Comment: Performed at [facility], [address] [place], [place] [address]  Resp panel by RT-PCR (RSV, Flu A&B, Covid) Anterior Nasal Swab     Status: None   Collection Time: 09/05/22  1:52 PM   Specimen: Anterior Nasal Swab  Result Value Ref Range Status   SARS Coronavirus 2 by RT PCR NEGATIVE NEGATIVE Final    Comment: (NOTE) SARS-CoV-2 target nucleic acids are NOT DETECTED.  The SARS-CoV-2 RNA is generally detectable in upper respiratory specimens during the acute phase of infection. The lowest concentration of SARS-CoV-2 viral copies this assay can detect is 138 copies/mL. A negative result does not preclude SARS-Cov-2 infection and should not be used as the sole basis for treatment or other patient management decisions. A negative result may occur with  improper specimen collection/handling, submission of specimen other than nasopharyngeal swab, presence of viral mutation(s) within the areas targeted by this assay, and inadequate number of viral copies(<138 copies/mL). A negative result must be combined with clinical observations, patient history, and epidemiological information. The expected result is Negative.  Fact Sheet for  Patients:  EntrepreneurPulse.com.au  Fact Sheet for Healthcare Providers:  IncredibleEmployment.be  This test is no t yet approved or cleared by the [place] FDA and  has been authorized for detection and/or diagnosis of SARS-CoV-2 by FDA under an Emergency Use Authorization (EUA). This EUA will remain  in effect (meaning this test can be used) for the duration of the COVID-19 declaration under Section 564(b)(1) of the Act, 21 U.S.C.section 360bbb-3(b)(1), unless the authorization is terminated  or revoked sooner.       Influenza A by PCR NEGATIVE NEGATIVE Final   Influenza B by PCR NEGATIVE NEGATIVE Final    Comment: (NOTE) The Xpert Xpress SARS-CoV-2/FLU/RSV plus assay is intended as an aid in the diagnosis of influenza from Nasopharyngeal swab specimens and should not be used as a sole basis for treatment. Nasal washings and aspirates are unacceptable for Xpert Xpress SARS-CoV-2/FLU/RSV testing.  Fact Sheet for Patients: EntrepreneurPulse.com.au  Fact Sheet for Healthcare Providers: IncredibleEmployment.be  This test is not yet approved or cleared by the [place] FDA and has been authorized for detection and/or diagnosis of SARS-CoV-2 by FDA under an Emergency  Use Authorization (EUA). This EUA will remain in effect (meaning this test can be used) for the duration of the COVID-19 declaration under Section 564(b)(1) of the Act, 21 U.S.C. section 360bbb-3(b)(1), unless the authorization is terminated or revoked.     Resp Syncytial Virus by PCR NEGATIVE NEGATIVE Final    Comment: (NOTE) Fact Sheet for Patients: EntrepreneurPulse.com.au  Fact Sheet for Healthcare Providers: IncredibleEmployment.be  This test is not yet approved or cleared by the [place] FDA and has been authorized for detection and/or diagnosis of SARS-CoV-2 by FDA under an Emergency  Use Authorization (EUA). This EUA will remain in effect (meaning this test can be used) for the duration of the COVID-19 declaration under Section 564(b)(1) of the Act, 21 U.S.C. section 360bbb-3(b)(1), unless the authorization is terminated or revoked.  Performed at [facility], [place], [place] [address]   Respiratory (~20 pathogens) panel by PCR     Status: None   Collection Time: 09/05/22  2:50 PM  Result Value Ref Range Status   Adenovirus NOT DETECTED NOT DETECTED Final   Coronavirus 229E NOT DETECTED NOT DETECTED Final    Comment: (NOTE) The Coronavirus on the Respiratory Panel, DOES NOT test for the novel  Coronavirus (2019 nCoV)    Coronavirus HKU1 NOT DETECTED NOT DETECTED Final   Coronavirus NL63 NOT DETECTED NOT DETECTED Final   Coronavirus OC43 NOT DETECTED NOT DETECTED Final   Metapneumovirus NOT DETECTED NOT DETECTED Final   Rhinovirus / Enterovirus NOT DETECTED NOT DETECTED Final   Influenza A NOT DETECTED NOT DETECTED Final   Influenza B NOT DETECTED NOT DETECTED Final   Parainfluenza Virus 1 NOT DETECTED NOT DETECTED Final   Parainfluenza Virus 2 NOT DETECTED NOT DETECTED Final   Parainfluenza Virus 3 NOT DETECTED NOT DETECTED Final   Parainfluenza Virus 4 NOT DETECTED NOT DETECTED Final   Respiratory Syncytial Virus NOT DETECTED NOT DETECTED Final   Bordetella pertussis NOT DETECTED NOT DETECTED Final   Bordetella Parapertussis NOT DETECTED NOT DETECTED Final   Chlamydophila pneumoniae NOT DETECTED NOT DETECTED Final   Mycoplasma pneumoniae NOT DETECTED NOT DETECTED Final    Comment: Performed at [facility], [address], [place], [place] [address]    Coagulation Studies: No results for input(s): "LABPROT", "INR" in the last 72 hours.  Urinalysis: No results for input(s): "COLORURINE", "LABSPEC", "PHURINE", "GLUCOSEU", "HGBUR", "BILIRUBINUR", "KETONESUR", "PROTEINUR", "UROBILINOGEN", "NITRITE", "LEUKOCYTESUR" in the last  72 hours.  Invalid input(s): "APPERANCEUR"    Imaging: CT ASPIRATION N/S  Result Date: 10/13/2022 INDICATION: Right psoas abscess EXAM: CT-guided aspiration of right psoas abscess MEDICATIONS: Documented in the EMR ANESTHESIA/SEDATION: Local analgesia COMPLICATIONS: None immediate. PROCEDURE: Informed written consent was obtained from the patient after a thorough discussion of the procedural risks, benefits and alternatives. All questions were addressed. Maximal Sterile Barrier Technique was utilized including caps, mask, sterile gowns, sterile gloves, sterile drape, hand hygiene and skin antiseptic. A timeout was performed prior to the initiation of the procedure. The patient was placed prone on the exam table. Limited CT of the abdomen and pelvis was performed for planning purposes. This demonstrated small fluid collection in the right psoas muscle. Skin entry site was marked, and the overlying skin was prepped and draped in the standard sterile fashion. Local analgesia was obtained with 1% lidocaine. Using intermittent CT fluoroscopy, a 19 gauge Yueh catheter was advanced into the identified small fluid collection in the right psoas muscle. Approximately 2 mL of purulent fluid was aspirated.  This was sent to the lab for microbiology analysis. Needle was removed, and a clean dressing was placed. The patient tolerated the procedure well without immediate complication. IMPRESSION: Successful CT-guided aspiration of right psoas abscess, yielding 2 mL of purulent fluid. Sample sent for microbiology analysis. Electronically Signed   By: Albin Felling M.D.   On: 10/13/2022 11:56   MR Lumbar Spine W Wo Contrast  Result Date: 10/12/2022 CLINICAL DATA:  Bilateral leg pain for 3 weeks. EXAM: MRI LUMBAR SPINE WITHOUT AND WITH CONTRAST TECHNIQUE: Multiplanar and multiecho pulse sequences of the lumbar spine were obtained without and with intravenous contrast. CONTRAST:  57mL GADAVIST GADOBUTROL 1 MMOL/ML IV SOLN  COMPARISON:  CT abdomen/pelvis 06/27/2022, lumbar spine MRI 05/11/2022 FINDINGS: Segmentation: Standard; the lowest formed disc space is designated L5-S1. Alignment:  Normal. Vertebrae: Vertebral body heights are preserved. Background marrow signal is normal There is abnormal T1 hypointensity with edema and postcontrast enhancement of the L4 and L5 vertebral bodies along the disc space. There is fluid within the disc space with abnormal enhancement at the periphery of the disc bilaterally suspicious for intradiscal abscess. Findings concerning for discitis/osteomyelitis. There is circumferential enhancing material within the spinal canal at L4 extending into the sacrum likely reflecting epidural phlegmon, thickest along the L5 vertebral body where it measures up to 5 mm there is no defined abscess. There is STIR signal abnormality and enhancement about the bilateral facet joints at this level suspicious for septic arthritis, more convincing on the left (13-28, 29, 12-13, 7). There is no other abnormal marrow edema or enhancement. Conus medullaris and cauda equina: Conus extends to the L1-L2 level. Conus and cauda equina appear normal. There is no abnormal enhancement of the conus or cauda equina nerve roots. Paraspinal and other soft tissues: There is a edema and enhancement in the bilateral psoas muscles consistent with infectious myositis. There is a multiloculated abscess in the right psoas muscle measuring up to approximately 1.7 cm AP x 1.4 cm TV by 2.5 cm cc (13-28). On the left, there is smaller abscess measuring 0.9 cm x 0.8 cm by 1.0 cm (13-26, 12-15). Bilateral renal cysts are noted. Disc levels: There is disc desiccation and narrowing most advanced at L5-S1 with overall mild disc degeneration at the other levels. T12-L1: No significant spinal canal or neural foraminal stenosis. L1-L2: No significant spinal canal or neural foraminal stenosis. L2-L3: There is a diffuse disc bulge and left worse than right  facet arthropathy with 6 mm nodular T2 hypointense lesion projecting medially from the left facet joint favored to reflect a complex synovial cyst. Findings again resulting in mild spinal canal stenosis with left subarticular zone narrowing and mild bilateral neural foraminal stenosis, unchanged. L3-L4: There is a mild disc bulge and moderate bilateral facet arthropathy with ligamentum flavum thickening resulting in moderate bilateral neural foraminal stenosis without significant spinal canal stenosis, unchanged. L4-L5: There is a disc bulge and moderate bilateral facet arthropathy superimposed on acute infectious findings detailed above resulting in mild-to-moderate spinal canal stenosis with bilateral subarticular zone narrowing and moderate bilateral neural foraminal stenosis. L5-S1: There is a diffuse disc bulge with a central annular fissure and mild bilateral facet arthropathy resulting in bilateral subarticular zone narrowing and moderate bilateral neural foraminal stenosis. IMPRESSION: 1. Findings concerning for discitis/osteomyelitis at L4-L5 with suspected intradiscal abscess. 2. 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 3. Circumferential epidural phlegmon within the spinal canal at L4-L5 extending into the sacrum, thickest  along the L5 vertebral body where it measures up to 5 mm. No defined epidural abscess and no high grade spinal canal stenosis 4. Enhancement and edema about the bilateral facet joints at L4-L5 is suspicious for septic arthritis, more convincing on the left. 5. Multilevel degenerative changes as above. Electronically Signed   By: Valetta Mole M.D.   On: 10/12/2022 10:21   US Venous Img Lower Bilateral  Result Date: 10/12/2022 CLINICAL DATA:  72 year old female with severe bilateral leg pain. EXAM: BILATERAL LOWER EXTREMITY VENOUS DOPPLER ULTRASOUND TECHNIQUE: Gray-scale sonography with compression, as well as color and  duplex ultrasound, were performed to evaluate the deep venous system(s) from the level of the common femoral vein through the popliteal and proximal calf veins. COMPARISON:  03/20/2022 and earlier. FINDINGS: VENOUS Normal compressibility of the bilateral common femoral, superficial femoral, and popliteal veins, as well as the visualized calf veins. Visualized portions of the bilateral profunda femoral vein and great saphenous vein unremarkable. No filling defects to suggest DVT on grayscale or color Doppler imaging. Doppler waveforms show normal direction of venous flow, normal respiratory plasticity and response to augmentation. OTHER None. Limitations: none IMPRESSION: No evidence of bilateral lower extremity deep venous thrombosis. Electronically Signed   By: Genevie Ann M.D.   On: 10/12/2022 07:10     Medications:    anticoagulant sodium citrate     ceFEPime (MAXIPIME) IV     vancomycin 2,000 mg (10/13/22 1237)    atorvastatin  80 mg Oral QHS   Chlorhexidine Gluconate Cloth  6 each Topical Q0600   gabapentin  100 mg Oral BID   insulin aspart  0-9 Units Subcutaneous TID WC   levETIRAcetam  1,000 mg Oral Daily   levETIRAcetam  250 mg Oral Q M,W,F   losartan  100 mg Oral Daily   metoprolol succinate  100 mg Oral Daily   pantoprazole  20 mg Oral BID   sertraline  25 mg Oral Daily   sodium chloride flush  10-40 mL Intracatheter Q12H   sodium chloride flush  3 mL Intravenous Q12H   umeclidinium-vilanterol  1 puff Inhalation Daily   acetaminophen **OR** acetaminophen, alteplase, anticoagulant sodium citrate, heparin, hydrALAZINE, lidocaine (PF), lidocaine-prilocaine, morphine injection, pentafluoroprop-tetrafluoroeth, senna-docusate, sodium chloride flush, traMADol  Assessment/ Plan:  Cynthia Dean is a 72 y.o.  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  She presents to the ED with worsening lower extremity pain. Patient will be admitted for Back pain [M54.9] Bilateral leg pain [M79.604, M79.605]  [facility]/MWF/Lt AVG /102.5kg  End-stage renal disease on hemodialysis.  Will maintain outpatient schedule if possible.  Dialysis yesterday with UF 1.5L achieved. Next treatment scheduled for Friday.   2. Anemia of chronic kidney disease Normocytic Lab Results  Component Value Date   HGB 9.2 (L) 10/13/2022  Patient receives Mircera at outpatient clinic.  Hemoglobin stable.   3. Secondary Hyperparathyroidism: with outpatient labs: PTH 180, phosphorus 4.5, calcium 9.1 on 10/03/22.   Lab Results  Component Value Date   PTH 131 (H) 03/14/2018   CALCIUM 8.8 (L) 10/13/2022   CAION 1.09 (L) 08/28/2022   PHOS 5.4 (H) 09/09/2022   Bone minerals acceptable Will continue to monitor during this admission  4.  Hypertension with chronic kidney disease.  Home regimen includes losartan, metoprolol, and torsemide.  Prescribed losartan and metoprolol.   5. Diabetes mellitus type II with chronic kidney  disease/renal manifestations: insulin dependent. Home regimen includes Novolin 70/30. Most recent hemoglobin A1c is 5.8 on 03/23/22.   SSI managed by primary team  6. Vertebral osteomyelitis, seen on CT lumber spine. Neurosurgery feels patient is not a surgical candidate Recommend antibiotics. ID following. CT guided aspiration of psoas abscess on 10/13/22.     LOS: [place] 3/21/202412:54 PM

## 2022-10-13 NOTE — Progress Notes (Signed)
Spoke with patient's daughter Cynthia Dean, Had been notified she had some concerns about her mother's care.She stated she felt like the ED MD was not being careful with her mom when checking her mobility due to pain. Also concerned had family had suggested an MRI and were told was not indicated. Per the daughter, the patient's PCP spoke with the EDP Dr. Jori Moll and expressed felt like there was a need for a MRI. MRI was ordered and I was told by the daughter is showed the patient had spinal abscesses. The EDP suggested sending the patient home, prior to ordering the MRI with some percocet and followup with PCP. Daughter states percocet is one of her  mother's listed allergies. Cynthia Dean stated she asked for transfer to [facility] for her mother and was told NO. When [place] came to visit her mother this morning, she noted a bandage on her mother's left arm. She stated there was a pink band indicating not to draw from that arm. She then talked to the Charge RN and got a pink sleeve for her mother's arm. I talked with Cynthia Dean for about 7 minutes and gave her the [facility] number to call if other concerns. Told her I would make the leader of the EDPs aware of her concerns and the lab leader.

## 2022-10-13 NOTE — Procedures (Signed)
Interventional Radiology Procedure Note  Date of Procedure: 10/13/2022  Procedure: CT aspiration   Findings:  1. CT aspiration of right psoas muscle abscess yielding 2 ml purulent material    Complications: No immediate complications noted.   Estimated Blood Loss: minimal  Follow-up and Recommendations: 1. F/u cultures    Albin Felling, MD  Vascular & Interventional Radiology  10/13/2022 11:03 AM

## 2022-10-14 DIAGNOSIS — M4646 Discitis, unspecified, lumbar region: Secondary | ICD-10-CM | POA: Diagnosis not present

## 2022-10-14 DIAGNOSIS — K6812 Psoas muscle abscess: Secondary | ICD-10-CM | POA: Diagnosis not present

## 2022-10-14 DIAGNOSIS — G062 Extradural and subdural abscess, unspecified: Secondary | ICD-10-CM | POA: Diagnosis not present

## 2022-10-14 LAB — CBC
HCT: 31.7 % — ABNORMAL LOW (ref 36.0–46.0)
Hemoglobin: 9.4 g/dL — ABNORMAL LOW (ref 12.0–15.0)
MCH: 24.5 pg — ABNORMAL LOW (ref 26.0–34.0)
MCHC: 29.7 g/dL — ABNORMAL LOW (ref 30.0–36.0)
MCV: 82.8 fL (ref 80.0–100.0)
Platelets: 224 10*3/uL (ref 150–400)
RBC: 3.83 MIL/uL — ABNORMAL LOW (ref 3.87–5.11)
RDW: 21.2 % — ABNORMAL HIGH (ref 11.5–15.5)
WBC: 7.7 10*3/uL (ref 4.0–10.5)
nRBC: 0 % (ref 0.0–0.2)

## 2022-10-14 LAB — BASIC METABOLIC PANEL
Anion gap: 12 (ref 5–15)
BUN: 25 mg/dL — ABNORMAL HIGH (ref 8–23)
CO2: 26 mmol/L (ref 22–32)
Calcium: 9.2 mg/dL (ref 8.9–10.3)
Chloride: 99 mmol/L (ref 98–111)
Creatinine, Ser: 6.3 mg/dL — ABNORMAL HIGH (ref 0.44–1.00)
GFR, Estimated: 7 mL/min — ABNORMAL LOW (ref >60)
Glucose, Bld: 91 mg/dL (ref 70–99)
Potassium: 3.2 mmol/L — ABNORMAL LOW (ref 3.5–5.1)
Sodium: 137 mmol/L (ref 135–145)

## 2022-10-14 LAB — GLUCOSE, CAPILLARY
Glucose-Capillary: 90 mg/dL (ref 70–99)
Glucose-Capillary: 114 mg/dL — ABNORMAL HIGH (ref 70–99)
Glucose-Capillary: 136 mg/dL — ABNORMAL HIGH (ref 70–99)

## 2022-10-14 LAB — HEMOGLOBIN A1C
Hgb A1c MFr Bld: 5.2 % (ref 4.8–5.6)
Mean Plasma Glucose: 103 mg/dL

## 2022-10-14 MED ORDER — NEPRO/CARBSTEADY PO LIQD
237.0000 mL | Freq: Three times a day (TID) | ORAL | Status: DC
  Administered 2022-10-14 – 2023-01-11 (×172): 237 mL via ORAL

## 2022-10-14 MED ORDER — RENA-VITE PO TABS
1.0000 | ORAL_TABLET | Freq: Every day | ORAL | Status: DC
  Administered 2022-10-14 – 2022-10-31 (×15): 1 via ORAL
  Filled 2022-10-14 (×20): qty 1

## 2022-10-14 MED ORDER — PROSOURCE PLUS PO LIQD
30.0000 mL | Freq: Two times a day (BID) | ORAL | Status: DC
  Administered 2022-10-14 – 2022-12-28 (×92): 30 mL via ORAL
  Filled 2022-10-14 (×152): qty 30

## 2022-10-14 MED ORDER — MORPHINE SULFATE (PF) 2 MG/ML IV SOLN
INTRAVENOUS | Status: AC
  Filled 2022-10-14: qty 1

## 2022-10-14 NOTE — Consult Note (Signed)
Pharmacy Antibiotic Note  Cynthia Dean is a 72 y.o. female admitted on 10/12/2022 with  discitis and possible osteomyelitis .  Pharmacy has been consulted for Vancomycin and Cefepime dosing. Of note, patient receives hemodialysis MWF outpatient. Plan is to continue regimen while inpatient.  Antibiotic Day 2. Infectious diease as been consulted  Plan: Continue Vancomycin 1g IV qHD session(currently set as MWF) Will order levels when appropriate  Continue Cefepime 1g Q24 hours  Doses ordered per [place] Antibiotic Protocol for hemodialysis  Height: 6' (182.9 cm) Weight: 96.5 kg (212 lb 11.9 oz) IBW/kg (Calculated) : 73.1  Temp (24hrs), Avg:98.2 F (36.8 C), Min:98.1 F (36.7 C), Max:98.3 F (36.8 C)  Recent Labs  Lab 10/12/22 0605 10/12/22 1740 10/13/22 0006 10/13/22 1146 10/14/22 0810  WBC 10.2  --   --  7.2 7.7  CREATININE 6.68*  --   --  5.09* 6.30*  LATICACIDVEN  --  1.5 0.8  --   --      Estimated Creatinine Clearance: 10.7 mL/min (A) (by C-G formula based on SCr of 6.3 mg/dL (H)).    Allergies  Allergen Reactions   Oxycodone Nausea And Vomiting   Oxycodone-Acetaminophen Other (See Comments)   Wound Dressing Adhesive    Hydrocodone     Intolerant more than allergic   Tape Itching    Skin Dermatitis/itching (tape adhesive) Skin Dermatitis/itching (tape adhesive)   Tapentadol Itching    Skin Dermatitis/itching (tape adhesive) Skin Dermatitis/itching (tape adhesive)     Antimicrobials this admission: Vancomycin 3/21 >>  Cefepime 3/21 >>   Dose adjustments this admission: N/A  Microbiology results: 3/20 BCx: NGTD 3/20 anaerobic cx: pending 3/21 MRSA PCR: pending  Thank you for allowing pharmacy to be a part of this patient's care.  Pearla Dubonnet 10/14/2022 9:41 AM

## 2022-10-14 NOTE — Progress Notes (Signed)
Initial Nutrition Assessment  DOCUMENTATION CODES:   Not applicable  INTERVENTION:   -Liberalize diet to regular for widest variety of meal selections -Renal MVI daily -30 ml Prosource Plus BID, each supplement provides 100 kcals and 15 grams protein -Nepro Shake po TID, each supplement provides 425 kcal and 19 grams protein   NUTRITION DIAGNOSIS:   Increased nutrient needs related to chronic illness (ESRD on HD) as evidenced by estimated needs.  GOAL:   Patient will meet greater than or equal to 90% of their needs  MONITOR:   PO intake, Supplement acceptance  REASON FOR ASSESSMENT:   Malnutrition Screening Tool    ASSESSMENT:   Pt with PMH of ESRD on HD, HLD, COPD, DM2, HTN, depression, CVA who presents with back pain x 3 weeks PTA.  Pt admitted with possible discitis/ osteomyelitis at L4-L5 and possible infectious myositis.   3/21- s/p CT aspiration of rt psoas muscle (2 ml purulent material removed)  Reviewed I/O's: +446 ml x 24 hours  Per notes, pt refusing care, including lab draws and CBG checks.    Per neurosurgery notes, recommending antibiotics per ID. Pt is a poor surgical candidate due to poor ambulation status (pt has not walked in at least 3 weeks PTA).  Pt unavailable at time of visit; currently down in HD suite. RD unable to obtain further nutrition-related history or complete nutrition-focused physical exam at this time.    Pt currently on a carb modified diet. No meal completion data available at this time.   Per nephrology notes, EDW 102.5 kg. Pt is under EDW. Reviewed wt hx; pt has experienced a 6.1% wt loss over the past month, which is significant for time frame. Pt also with edema, which may be masking further weight loss as well as fat and muscle depletions.   Pt is at high risk for malnutrition due to significant wt loss as well as multiple co-morbidities. However, unable to assess at this time. Pt would greatly benefit from addition of oral  nutrition supplements.   Medications reviewed and include keppra.   Lab Results  Component Value Date   HGBA1C 5.8 (H) 03/23/2022   PTA DM medications are 28 units insulin NPH-regular BID.   Labs reviewed: K: 3.2, Phos: 5.4, CBGS: 88-99 (inpatient orders for glycemic control are 0-9 units insulin aspart TID with meals ).    Diet Order:   Diet Order             Diet Carb Modified Fluid consistency: Thin; Room service appropriate? Yes  Diet effective now                   EDUCATION NEEDS:   No education needs have been identified at this time  Skin:  Skin Assessment: Skin Integrity Issues: Skin Integrity Issues:: Other (Comment) Other: IAD to lt buttocks  Last BM:  10/12/22  Height:   Ht Readings from Last 1 Encounters:  10/12/22 6' (1.829 m)    Weight:   Wt Readings from Last 1 Encounters:  10/14/22 96.5 kg    Ideal Body Weight:  72.7 kg  BMI:  Body mass index is 28.85 kg/m.  Estimated Nutritional Needs:   Kcal:  2150-2350  Protein:  105-120 grams  Fluid:  1000 ml + UOP    Loistine Chance, RD, LDN, [place] Registered Dietitian II Certified Diabetes Care and Education Specialist Please refer to [facility] for RD and/or RD on-call/weekend/after hours pager

## 2022-10-14 NOTE — Progress Notes (Signed)
Date of Admission:  10/12/2022      ID: Cynthia Dean is a 72 y.o. female  Principal Problem:   Back pain Active Problems:   Essential hypertension   Seizure disorder (HCC)   Diabetic retinopathy without macular edema associated with type 2 diabetes mellitus (HCC)   GERD (gastroesophageal reflux disease)   Dyslipidemia   Diabetic neuropathy (HCC)   Anemia of chronic disease   ESRD on dialysis ([place])   Anemia   Insulin-requiring or dependent type II diabetes mellitus ([place])   Discitis   Myositis   Partner at bed side Subjective: Pt is c/o pain down her legs   Medications:   (feeding supplement) PROSource Plus  30 mL Oral BID BM   atorvastatin  80 mg Oral QHS   Chlorhexidine Gluconate Cloth  6 each Topical Q0600   feeding supplement (NEPRO CARB STEADY)  237 mL Oral TID BM   gabapentin  100 mg Oral BID   insulin aspart  0-9 Units Subcutaneous TID WC   levETIRAcetam  1,000 mg Oral Daily   levETIRAcetam  250 mg Oral Q M,W,F   losartan  100 mg Oral Daily   metoprolol succinate  100 mg Oral Daily   multivitamin  1 tablet Oral QHS   nystatin  5 mL Oral QID   pantoprazole  20 mg Oral BID   sertraline  25 mg Oral Daily   sodium chloride flush  10-40 mL Intracatheter Q12H   umeclidinium-vilanterol  1 puff Inhalation Daily    Objective: Vital signs in last 24 hours: Patient Vitals for the past 24 hrs:  BP Temp Temp src Pulse Resp SpO2 Weight  10/14/22 1240 (!) 141/59 98.4 F (36.9 C) -- 81 18 100 % --  10/14/22 1159 -- -- -- -- -- -- 95.4 kg  10/14/22 1152 (!) 171/67 98.4 F (36.9 C) Oral 77 (!) 25 100 % --  10/14/22 1130 (!) 159/70 -- -- 78 18 100 % --  10/14/22 1100 (!) 151/74 -- -- 78 17 100 % --  10/14/22 1030 (!) 153/73 -- -- 77 15 99 % --  10/14/22 1000 (!) 164/72 -- -- 74 13 100 % --  10/14/22 0930 (!) 154/73 -- -- 74 14 100 % --  10/14/22 0900 (!) 165/75 -- -- 72 17 100 % --  10/14/22 0830 (!) 172/75 -- -- 70 13 100 % --  10/14/22 0802 (!) 169/74 --  -- 69 13 100 % --  10/14/22 0759 -- -- -- -- -- -- 96.5 kg  10/14/22 0755 (!) 175/74 98.2 F (36.8 C) Oral 70 13 99 % --  10/13/22 2032 (!) 147/64 98.3 F (36.8 C) Oral 73 17 90 % --  10/13/22 1631 (!) 151/61 98.1 F (36.7 C) -- 67 18 94 % --       PHYSICAL EXAM:  General: awake, tearful because of pain  Lungs: Clear to auscultation bilaterally. No Wheezing or Rhonchi. No rales. Heart: Regular rate and rhythm, no murmur, rub or gallop. Abdomen: Soft, non-tender,not distended. Bowel sounds normal. No masses Extremities: atraumatic, no cyanosis. No edema. No clubbing Skin: No rashes or lesions. Or bruising Lymph: Cervical, supraclavicular did not examine in detail CNS Grossly non-focal  Lab Results    Latest Ref Rng & Units 10/14/2022    8:10 AM 10/13/2022   11:46 AM 10/12/2022    6:05 AM  CBC  WBC 4.0 - 10.5 K/uL 7.7  7.2  10.2   Hemoglobin 12.0 - 15.0 g/dL  9.4  9.2  9.6   Hematocrit 36.0 - 46.0 % 31.7  31.3  35.0   Platelets 150 - 400 K/uL 224  221  199        Latest Ref Rng & Units 10/14/2022    8:10 AM 10/13/2022   11:46 AM 10/12/2022    6:05 AM  CMP  Glucose 70 - 99 mg/dL 91  100  109   BUN 8 - 23 mg/dL 25  18  24    Creatinine 0.44 - 1.00 mg/dL 6.30  5.09  6.68   Sodium 135 - 145 mmol/L 137  135  138   Potassium 3.5 - 5.1 mmol/L 3.2  3.2  3.1   Chloride 98 - 111 mmol/L 99  95  105   CO2 22 - 32 mmol/L 26  27  24    Calcium 8.9 - 10.3 mg/dL 9.2  8.8  9.1     Erythrocyte Sedimentation Rate     Component Value Date/Time   ESRSEDRATE 70 (H) 10/13/2022 0006      Microbiology: 10/12/22 BC- NG so far 3/21 psoas abscess   Studies/Results: CT ASPIRATION N/S  Result Date: 10/13/2022 INDICATION: Right psoas abscess EXAM: CT-guided aspiration of right psoas abscess MEDICATIONS: Documented in the EMR ANESTHESIA/SEDATION: Local analgesia COMPLICATIONS: None immediate. PROCEDURE: Informed written consent was obtained from the patient after a thorough discussion of the  procedural risks, benefits and alternatives. All questions were addressed. Maximal Sterile Barrier Technique was utilized including caps, mask, sterile gowns, sterile gloves, sterile drape, hand hygiene and skin antiseptic. A timeout was performed prior to the initiation of the procedure. The patient was placed prone on the exam table. Limited CT of the abdomen and pelvis was performed for planning purposes. This demonstrated small fluid collection in the right psoas muscle. Skin entry site was marked, and the overlying skin was prepped and draped in the standard sterile fashion. Local analgesia was obtained with 1% lidocaine. Using intermittent CT fluoroscopy, a 19 gauge Yueh catheter was advanced into the identified small fluid collection in the right psoas muscle. Approximately 2 mL of purulent fluid was aspirated. This was sent to the lab for microbiology analysis. Needle was removed, and a clean dressing was placed. The patient tolerated the procedure well without immediate complication. IMPRESSION: Successful CT-guided aspiration of right psoas abscess, yielding 2 mL of purulent fluid. Sample sent for microbiology analysis. Electronically Signed   By: Albin Felling M.D.   On: 10/13/2022 11:56     Assessment/Plan:  Lumbar discitis,L4-L5  epidural phlegmon and b/l psoas abscess on MRI Underwent IR aspiration of psoas abscess on 10/13/22 Vanco and cefepime stated on 10/13/22 after procedure Gram stain numerous wbc no organism Culture pending Blood culture ng Continue antibiotics   ESRD on dialysis Recent malfunctioning AVG and underwent fistulogram, angioplasty and stent placement on 09/22/22      Left breast Carcinoma- s/p lumpectomy and axillary node dissection She could not tolerate chemo   Anemia   ?DM -    HTN on metoprolol  Discussed the management with patient and her partner

## 2022-10-14 NOTE — Progress Notes (Signed)
[facility]  ROUNDING NOTE   Subjective:   Cynthia Dean is a 72 y.o. 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 She presents to the ED with worsening lower extremity pain. Patient will be admitted for Back pain [M54.9] Bilateral leg pain [M79.604, M79.605]  Patient is known to our practice and receives outpatient dialysis treatments at [facility] on a MWF schedule, supervised by Dr Candiss Norse.   Patient seen and evaluated during dialysis   HEMODIALYSIS FLOWSHEET:  Blood Flow Rate (mL/min): 350 mL/min Arterial Pressure (mmHg): -190 mmHg Venous Pressure (mmHg): 160 mmHg TMP (mmHg): 7 mmHg Ultrafiltration Rate (mL/min): 597 mL/min Dialysate Flow Rate (mL/min): 300 ml/min Dialysis Fluid Bolus: Normal Saline  Initially tolerating treatment well Patient coughed and began having back and leg pain   Objective:  Vital signs in last 24 hours:  Temp:  [98.1 F (36.7 C)-98.3 F (36.8 C)] 98.2 F (36.8 C) (03/22 0755) Pulse Rate:  [67-77] 77 (03/22 1030) Resp:  [13-18] 15 (03/22 1030) BP: (147-175)/(61-75) 153/73 (03/22 1030) SpO2:  [90 %-100 %] 99 % (03/22 1030) Weight:  [96.5 kg] 96.5 kg (03/22 0759)  Weight change:  Filed Weights   10/12/22 1052 10/12/22 1440 10/14/22 0759  Weight: 96 kg 94.4 kg 96.5 kg    Intake/Output: I/O last 3 completed shifts: In: 446.4 [IV Piggyback:446.4] Out: -    Intake/Output this shift:  No intake/output data recorded.  Physical Exam: General: NAD, resting comfortably  Head: Normocephalic, atraumatic. Moist oral mucosal membranes  Eyes: Anicteric  Lungs:  Clear to auscultation, normal effort  Heart: Regular rate and rhythm  Abdomen:  Soft, nontender  Extremities: No peripheral edema.  Neurologic: Alert and oriented to self, moving all four extremities  Skin: No lesions  Access:  Left AVG    Basic Metabolic Panel: Recent Labs  Lab 10/12/22 0605 10/13/22 1146 10/14/22 0810  NA 138 135 137  K 3.1* 3.2* 3.2*  CL 105 95* 99  CO2 24 27 26   GLUCOSE 109* 100* 91  BUN 24* 18 25*  CREATININE 6.68* 5.09* 6.30*  CALCIUM 9.1 8.8* 9.2     Liver Function Tests: No results for input(s): "AST", "ALT", "ALKPHOS", "BILITOT", "PROT", "ALBUMIN" in the last 168 hours. No results for input(s): "LIPASE", "AMYLASE" in the last 168 hours. No results for input(s): "AMMONIA" in the last 168 hours.  CBC: Recent Labs  Lab 10/12/22 0605 10/13/22 1146 10/14/22 0810  WBC 10.2 7.2 7.7  NEUTROABS 7.6  --   --   HGB 9.6* 9.2* 9.4*  HCT 35.0* 31.3* 31.7*  MCV 86.0 82.4 82.8  PLT 199 221 224     Cardiac Enzymes: No results for input(s): "CKTOTAL", "CKMB", "CKMBINDEX", "TROPONINI" in the last 168 hours.  BNP: Invalid input(s): "POCBNP"  CBG: Recent Labs  Lab 10/12/22 2001 10/13/22 0811 10/13/22 1205 10/13/22 1630 10/13/22 2033  GLUCAP 92 92 90 88 99     Microbiology: Results for orders placed or performed during the hospital encounter of 10/12/22  Blood culture (routine x 2)     Status: None (Preliminary result)   Collection Time: 10/12/22  5:40 PM   Specimen: BLOOD  Result Value Ref Range Status   Specimen Description BLOOD BLOOD RIGHT HAND [facility]  Final   Special Requests   Final    BOTTLES DRAWN AEROBIC AND ANAEROBIC Blood Culture results may not be optimal due to an inadequate  volume of blood received in culture bottles   Culture   Final    NO GROWTH 2 DAYS Performed at [facility], [address] [place], [place] [address]    Report Status PENDING  Incomplete  Blood culture (routine x 2)     Status: None (Preliminary result)   Collection Time: 10/12/22  6:56 PM   Specimen: BLOOD  Result Value Ref Range Status   Specimen Description BLOOD BLOOD RIGHT FOREARM  Final   Special Requests   Final    BOTTLES DRAWN AEROBIC ONLY Blood Culture  results may not be optimal due to an inadequate volume of blood received in culture bottles   Culture   Final    NO GROWTH 2 DAYS Performed at [facility], [address], [place], [place] [address]    Report Status PENDING  Incomplete  Aerobic/Anaerobic Culture w Gram Stain (surgical/deep wound)     Status: None (Preliminary result)   Collection Time: 10/13/22 11:44 AM   Specimen: Abscess  Result Value Ref Range Status   Specimen Description   Final    ABSCESS Performed at [facility], [address], [place], [place] [address]    Special Requests   Final    MUSCLE ABSCESS Performed at [facility], [address] [place], [place] [address]    Gram Stain   Final    ABUNDANT WBC PRESENT, PREDOMINANTLY PMN NO ORGANISMS SEEN    Culture   Final    CULTURE REINCUBATED FOR BETTER GROWTH Performed at [facility], [place] [address], [place], [place] [address]    Report Status PENDING  Incomplete    Coagulation Studies: No results for input(s): "LABPROT", "INR" in the last 72 hours.  Urinalysis: No results for input(s): "COLORURINE", "LABSPEC", "PHURINE", "GLUCOSEU", "HGBUR", "BILIRUBINUR", "KETONESUR", "PROTEINUR", "UROBILINOGEN", "NITRITE", "LEUKOCYTESUR" in the last 72 hours.  Invalid input(s): "APPERANCEUR"    Imaging: CT ASPIRATION N/S  Result Date: 10/13/2022 INDICATION: Right psoas abscess EXAM: CT-guided aspiration of right psoas abscess MEDICATIONS: Documented in the EMR ANESTHESIA/SEDATION: Local analgesia COMPLICATIONS: None immediate. PROCEDURE: Informed written consent was obtained from the patient after a thorough discussion of the procedural risks, benefits and alternatives. All questions were addressed. Maximal Sterile Barrier Technique was utilized including caps, mask, sterile gowns, sterile gloves, sterile drape, hand hygiene and skin antiseptic. A timeout was performed prior to the initiation of the procedure. The patient was  placed prone on the exam table. Limited CT of the abdomen and pelvis was performed for planning purposes. This demonstrated small fluid collection in the right psoas muscle. Skin entry site was marked, and the overlying skin was prepped and draped in the standard sterile fashion. Local analgesia was obtained with 1% lidocaine. Using intermittent CT fluoroscopy, a 19 gauge Yueh catheter was advanced into the identified small fluid collection in the right psoas muscle. Approximately 2 mL of purulent fluid was aspirated. This was sent to the lab for microbiology analysis. Needle was removed, and a clean dressing was placed. The patient tolerated the procedure well without immediate complication. IMPRESSION: Successful CT-guided aspiration of right psoas abscess, yielding 2 mL of purulent fluid. Sample sent for microbiology analysis. Electronically Signed   By: Albin Felling M.D.   On: 10/13/2022 11:56     Medications:    anticoagulant sodium citrate     ceFEPime (MAXIPIME) IV 200 mL/hr at 10/13/22 1451   vancomycin 1,000 mg (10/14/22 1058)    (feeding supplement) PROSource Plus  30  mL Oral BID BM   atorvastatin  80 mg Oral QHS   Chlorhexidine Gluconate Cloth  6 each Topical Q0600   feeding supplement (NEPRO CARB STEADY)  237 mL Oral TID BM   gabapentin  100 mg Oral BID   insulin aspart  0-9 Units Subcutaneous TID WC   levETIRAcetam  1,000 mg Oral Daily   levETIRAcetam  250 mg Oral Q M,W,F   losartan  100 mg Oral Daily   metoprolol succinate  100 mg Oral Daily   multivitamin  1 tablet Oral QHS   nystatin  5 mL Oral QID   pantoprazole  20 mg Oral BID   sertraline  25 mg Oral Daily   sodium chloride flush  10-40 mL Intracatheter Q12H   sodium chloride flush  3 mL Intravenous Q12H   umeclidinium-vilanterol  1 puff Inhalation Daily   acetaminophen **OR** acetaminophen, alteplase, anticoagulant sodium citrate, heparin, hydrALAZINE, lidocaine (PF), lidocaine-prilocaine, morphine injection,  pentafluoroprop-tetrafluoroeth, senna-docusate, sodium chloride flush, traMADol  Assessment/ Plan:  Ms. Azalyn Mole is a 72 y.o.  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 She presents to the ED with worsening lower extremity pain. Patient will be admitted for Back pain [M54.9] Bilateral leg pain [M79.604, M79.605]  [facility] [place]/MWF/Lt AVG /102.5kg  End-stage renal disease on hemodialysis.  Will maintain outpatient schedule if possible.  Receiving treatment today with UF goal 1.5L as tolerated.  Next treatment scheduled for Monday  2. Anemia of chronic kidney disease Normocytic Lab Results  Component Value Date   HGB 9.4 (L) 10/14/2022  Patient receives Mircera at outpatient clinic.  Hemoglobin within optimal range.  3. Secondary Hyperparathyroidism: with outpatient labs: PTH 180, phosphorus 4.5, calcium 9.1 on 10/03/22.   Lab Results  Component Value Date   PTH 131 (H) 03/14/2018   CALCIUM 9.2 10/14/2022   CAION 1.09 (L) 08/28/2022   PHOS 5.4 (H) 09/09/2022   Bone minerals acceptable Will continue to monitor during this admission  4.  Hypertension with chronic kidney disease.  Home regimen includes losartan, metoprolol, and torsemide.  Prescribed losartan and metoprolol.   Blood pressure 151/74 during dialysis  5. Diabetes mellitus type II with chronic kidney disease/renal manifestations: insulin dependent. Home regimen includes Novolin 70/30. Most recent hemoglobin A1c is 5.8 on 03/23/22.   SSI managed by primary team  6. Vertebral osteomyelitis, seen on CT lumber spine. Neurosurgery feels patient is not a surgical candidate Recommend antibiotics. ID following. CT guided aspiration of psoas abscess on 10/13/22.   Receiving Cefepime and vancomycin     LOS: 2   3/22/202411:04 AM

## 2022-10-14 NOTE — Progress Notes (Signed)
Patient refuse CHG and v/s by CNA, I waited some time and re attempted to get it doen, patietn does not wan anybody to touch her. Swinging her hand to be left alone and annoyed at [place] waking her up at this time.

## 2022-10-14 NOTE — Progress Notes (Signed)
PROGRESS NOTE    Cynthia Dean  [id number] DOB: January 01, 1951 DOA: 10/12/2022 PCP: Lowella Bandy, MD    Assessment & Plan:   Principal Problem:   Back pain Active Problems:   Anemia   ESRD on dialysis [facility])   Essential hypertension   Dyslipidemia   Seizure disorder ([place])   Diabetic retinopathy without macular edema associated with type 2 diabetes mellitus (HCC)   GERD (gastroesophageal reflux disease)   Diabetic neuropathy (HCC)   Anemia of chronic disease   Insulin-requiring or dependent type II diabetes mellitus ([employer])   Discitis   Myositis  Assessment and Plan: No notes have been filed under this hospital service. Service: Hospitalist  Possible Discitis/osteomyelitis at L4-L5: w/ suspected intradiscal abscess as per MRI. S/p CT aspiration of right psoas muscle abscess yielding 20mL of purulent material & cx is pending still. Continue on IV vanco, cefepime. Blood cxs NGTD. ID following and recs apprec    Possible Infectious myositis: of the adjacent psoas muscles with bilateral psoas abscesses, worse on the right as per MRI. Continue on IV vanco, cefepime as per ID. Cx is pending   ESRD: on HD MWF. Nephro following and recs apprec   Generalized weakness: will consult PT/OT when clinically appropriate   ACD: likely secondary to ESRD. H&H are stable  Hypokalemia: will be managed w/ HD   Hx of seizures: continue on home dose of keppra   HLD: continue on statin   Hx of COPD: w/o exacerbation. Continue on bronchodilators    DM2: well controlled, HbA1c 5.2. Continue on SSI w/ accuchecks   Peripheral neuropathy: likely secondary to DM2. Continue on home dose of gabapentin    HTN: continue on metoprolol    GERD: continue on PPI   Depression: severity unknown. Continue on home dose of sertraline    Overweight: BMI 28.2. Would benefit from weight loss         DVT prophylaxis: SCDs Code Status: full  Family Communication: discussed pt's care w/  pt's daughter at bedside and answered her questions  Disposition Plan: depends on PT/OT recs (not consulted yet)  Level of care: Med-Surg Status is: Inpatient Remains inpatient appropriate because: severity of illness    Consultants:  ID IR Neuro surg   Procedures:   Antimicrobials:   Subjective: Pt c/o back pain   Objective: Vitals:   10/13/22 0407 10/13/22 0812 10/13/22 1631 10/13/22 2032  BP: (!) 146/62 (!) 156/67 (!) 151/61 (!) 147/64  Pulse: 66 68 67 73  Resp: 18 18 18 17   Temp: 98 F (36.7 C) 98.4 F (36.9 C) 98.1 F (36.7 C) 98.3 F (36.8 C)  TempSrc:    Oral  SpO2: 98%  94% 90%  Weight:      Height:        Intake/Output Summary (Last 24 hours) at 10/14/2022 0744 Last data filed at 10/13/2022 1451 Gross per 24 hour  Intake 446.42 ml  Output --  Net 446.42 ml   Filed Weights   10/12/22 0555 10/12/22 1052 10/12/22 1440  Weight: 113.4 kg 96 kg 94.4 kg    Examination:  General exam: Appears uncomfortable  Respiratory system: diminished breath sounds b/l  Cardiovascular system: S1/S2+. No rubs or clicks  Gastrointestinal system: Abd is soft, NT, ND & normal bowel sounds Central nervous system: alert and awake. Moves all extremities  Psychiatry: judgement and insight appears poor. Flat mood and affect    Data Reviewed: I have personally reviewed following labs and imaging studies  CBC: Recent Labs  Lab 10/12/22 0605 10/13/22 1146  WBC 10.2 7.2  NEUTROABS 7.6  --   HGB 9.6* 9.2*  HCT 35.0* 31.3*  MCV 86.0 82.4  PLT 199 [id number]   Basic Metabolic Panel: Recent Labs  Lab 10/12/22 0605 10/13/22 1146  NA 138 135  K 3.1* 3.2*  CL 105 95*  CO2 24 27  GLUCOSE 109* 100*  BUN 24* 18  CREATININE 6.68* 5.09*  CALCIUM 9.1 8.8*   GFR: Estimated Creatinine Clearance: 13.1 mL/min (A) (by C-G formula based on SCr of 5.09 mg/dL (H)). Liver Function Tests: No results for input(s): "AST", "ALT", "ALKPHOS", "BILITOT", "PROT", "ALBUMIN" in the last 168  hours. No results for input(s): "LIPASE", "AMYLASE" in the last 168 hours. No results for input(s): "AMMONIA" in the last 168 hours. Coagulation Profile: No results for input(s): "INR", "PROTIME" in the last 168 hours. Cardiac Enzymes: No results for input(s): "CKTOTAL", "CKMB", "CKMBINDEX", "TROPONINI" in the last 168 hours. BNP (last 3 results) No results for input(s): "PROBNP" in the last 8760 hours. HbA1C: No results for input(s): "HGBA1C" in the last 72 hours. CBG: Recent Labs  Lab 10/12/22 2001 10/13/22 0811 10/13/22 1205 10/13/22 1630 10/13/22 2033  GLUCAP 92 92 90 88 99   Lipid Profile: No results for input(s): "CHOL", "HDL", "LDLCALC", "TRIG", "CHOLHDL", "LDLDIRECT" in the last 72 hours. Thyroid Function Tests: No results for input(s): "TSH", "T4TOTAL", "FREET4", "T3FREE", "THYROIDAB" in the last 72 hours. Anemia Panel: No results for input(s): "VITAMINB12", "FOLATE", "FERRITIN", "TIBC", "IRON", "RETICCTPCT" in the last 72 hours. Sepsis Labs: Recent Labs  Lab 10/12/22 1740 10/13/22 0006  LATICACIDVEN 1.5 0.8    Recent Results (from the past 240 hour(s))  Blood culture (routine x 2)     Status: None (Preliminary result)   Collection Time: 10/12/22  5:40 PM   Specimen: BLOOD  Result Value Ref Range Status   Specimen Description BLOOD BLOOD RIGHT HAND [facility]  Final   Special Requests   Final    BOTTLES DRAWN AEROBIC AND ANAEROBIC Blood Culture results may not be optimal due to an inadequate volume of blood received in culture bottles   Culture   Final    NO GROWTH 2 DAYS Performed at [facility], [address], [place], [place] [address]    Report Status PENDING  Incomplete  Blood culture (routine x 2)     Status: None (Preliminary result)   Collection Time: 10/12/22  6:56 PM   Specimen: BLOOD  Result Value Ref Range Status   Specimen Description BLOOD BLOOD RIGHT FOREARM  Final   Special Requests   Final    BOTTLES DRAWN AEROBIC ONLY Blood Culture  results may not be optimal due to an inadequate volume of blood received in culture bottles   Culture   Final    NO GROWTH 2 DAYS Performed at [facility], [address] [place], [place] [address]    Report Status PENDING  Incomplete  Aerobic/Anaerobic Culture w Gram Stain (surgical/deep wound)     Status: None (Preliminary result)   Collection Time: 10/13/22 11:44 AM   Specimen: Abscess  Result Value Ref Range Status   Specimen Description   Final    ABSCESS Performed at [facility], [address] [place], [place] [address]    Special Requests   Final    MUSCLE ABSCESS Performed at [facility], [address] [place], [place] [address]    Gram Stain   Final  ABUNDANT WBC PRESENT, PREDOMINANTLY PMN NO ORGANISMS SEEN Performed at [facility] [address] [place], [place] [address]    Culture PENDING  Incomplete   Report Status PENDING  Incomplete         Radiology Studies: CT ASPIRATION N/S  Result Date: 10/13/2022 INDICATION: Right psoas abscess EXAM: CT-guided aspiration of right psoas abscess MEDICATIONS: Documented in the EMR ANESTHESIA/SEDATION: Local analgesia COMPLICATIONS: None immediate. PROCEDURE: Informed written consent was obtained from the patient after a thorough discussion of the procedural risks, benefits and alternatives. All questions were addressed. Maximal Sterile Barrier Technique was utilized including caps, mask, sterile gowns, sterile gloves, sterile drape, hand hygiene and skin antiseptic. A timeout was performed prior to the initiation of the procedure. The patient was placed prone on the exam table. Limited CT of the abdomen and pelvis was performed for planning purposes. This demonstrated small fluid collection in the right psoas muscle. Skin entry site was marked, and the overlying skin was prepped and draped in the standard sterile fashion. Local analgesia was obtained with 1% lidocaine. Using  intermittent CT fluoroscopy, a 19 gauge Yueh catheter was advanced into the identified small fluid collection in the right psoas muscle. Approximately 2 mL of purulent fluid was aspirated. This was sent to the lab for microbiology analysis. Needle was removed, and a clean dressing was placed. The patient tolerated the procedure well without immediate complication. IMPRESSION: Successful CT-guided aspiration of right psoas abscess, yielding 2 mL of purulent fluid. Sample sent for microbiology analysis. Electronically Signed   By: Albin Felling M.D.   On: 10/13/2022 11:56   MR Lumbar Spine W Wo Contrast  Result Date: 10/12/2022 CLINICAL DATA:  Bilateral leg pain for 3 weeks. EXAM: MRI LUMBAR SPINE WITHOUT AND WITH CONTRAST TECHNIQUE: Multiplanar and multiecho pulse sequences of the lumbar spine were obtained without and with intravenous contrast. CONTRAST:  65mL GADAVIST GADOBUTROL 1 MMOL/ML IV SOLN COMPARISON:  CT abdomen/pelvis 06/27/2022, lumbar spine MRI 05/11/2022 FINDINGS: Segmentation: Standard; the lowest formed disc space is designated L5-S1. Alignment:  Normal. Vertebrae: Vertebral body heights are preserved. Background marrow signal is normal There is abnormal T1 hypointensity with edema and postcontrast enhancement of the L4 and L5 vertebral bodies along the disc space. There is fluid within the disc space with abnormal enhancement at the periphery of the disc bilaterally suspicious for intradiscal abscess. Findings concerning for discitis/osteomyelitis. There is circumferential enhancing material within the spinal canal at L4 extending into the sacrum likely reflecting epidural phlegmon, thickest along the L5 vertebral body where it measures up to 5 mm there is no defined abscess. There is STIR signal abnormality and enhancement about the bilateral facet joints at this level suspicious for septic arthritis, more convincing on the left (13-28, 29, 12-13, 7). There is no other abnormal marrow edema or  enhancement. Conus medullaris and cauda equina: Conus extends to the L1-L2 level. Conus and cauda equina appear normal. There is no abnormal enhancement of the conus or cauda equina nerve roots. Paraspinal and other soft tissues: There is a edema and enhancement in the bilateral psoas muscles consistent with infectious myositis. There is a multiloculated abscess in the right psoas muscle measuring up to approximately 1.7 cm AP x 1.4 cm TV by 2.5 cm cc (13-28). On the left, there is smaller abscess measuring 0.9 cm x 0.8 cm by 1.0 cm (13-26, 12-15). Bilateral renal cysts are noted. Disc levels: There is disc desiccation and narrowing most advanced at L5-S1 with overall mild  disc degeneration at the other levels. T12-L1: No significant spinal canal or neural foraminal stenosis. L1-L2: No significant spinal canal or neural foraminal stenosis. L2-L3: There is a diffuse disc bulge and left worse than right facet arthropathy with 6 mm nodular T2 hypointense lesion projecting medially from the left facet joint favored to reflect a complex synovial cyst. Findings again resulting in mild spinal canal stenosis with left subarticular zone narrowing and mild bilateral neural foraminal stenosis, unchanged. L3-L4: There is a mild disc bulge and moderate bilateral facet arthropathy with ligamentum flavum thickening resulting in moderate bilateral neural foraminal stenosis without significant spinal canal stenosis, unchanged. L4-L5: There is a disc bulge and moderate bilateral facet arthropathy superimposed on acute infectious findings detailed above resulting in mild-to-moderate spinal canal stenosis with bilateral subarticular zone narrowing and moderate bilateral neural foraminal stenosis. L5-S1: There is a diffuse disc bulge with a central annular fissure and mild bilateral facet arthropathy resulting in bilateral subarticular zone narrowing and moderate bilateral neural foraminal stenosis. IMPRESSION: 1. Findings concerning  for discitis/osteomyelitis at L4-L5 with suspected intradiscal abscess. 2. 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 3. Circumferential epidural phlegmon within the spinal canal at L4-L5 extending into the sacrum, thickest along the L5 vertebral body where it measures up to 5 mm. No defined epidural abscess and no high grade spinal canal stenosis 4. Enhancement and edema about the bilateral facet joints at L4-L5 is suspicious for septic arthritis, more convincing on the left. 5. Multilevel degenerative changes as above. Electronically Signed   By: Valetta Mole M.D.   On: 10/12/2022 10:21        Scheduled Meds:  atorvastatin  80 mg Oral QHS   Chlorhexidine Gluconate Cloth  6 each Topical Q0600   gabapentin  100 mg Oral BID   insulin aspart  0-9 Units Subcutaneous TID WC   levETIRAcetam  1,000 mg Oral Daily   levETIRAcetam  250 mg Oral Q M,W,F   losartan  100 mg Oral Daily   metoprolol succinate  100 mg Oral Daily   nystatin  5 mL Oral QID   pantoprazole  20 mg Oral BID   sertraline  25 mg Oral Daily   sodium chloride flush  10-40 mL Intracatheter Q12H   sodium chloride flush  3 mL Intravenous Q12H   umeclidinium-vilanterol  1 puff Inhalation Daily   Continuous Infusions:  anticoagulant sodium citrate     ceFEPime (MAXIPIME) IV 200 mL/hr at 10/13/22 1451   vancomycin       LOS: 2 days    Time spent: 30 mins     Wyvonnia Dusky, MD [facility] Pager [phone]  If 7PM-7AM, please contact night-coverage [email or web address] 10/14/2022, 7:44 AM

## 2022-10-14 NOTE — Progress Notes (Signed)
Patient refuse lab draw from her port. Chased me out the room. Just want to be left alone.

## 2022-10-14 NOTE — TOC CM/SW Note (Signed)
CSW acknowledges consult for home health/DME needs. Patient is active with [facility] for PT and OT. They feel like she may need hospice services due to inability to keep her out of the hospital because of her pain. MD is aware. Please consult TOC if any other needs arise.  Dayton Scrape, [place]

## 2022-10-14 NOTE — Progress Notes (Signed)
  Received patient in bed to unit.   Informed consent signed and in chart.    TX duration:3.5hrs     Transported back to floor  no c/o  no distress noted Hand-off given to patient's nurse.    Access used: LAVF Access issues: none   Total UF removed: 1.5kg Medication(s) given: Vancomycin 1000mg , morphine 2mg  Post HD VS: stable Post HD weight: 95.4kg     Darrol Jump  [facility]

## 2022-10-14 NOTE — Progress Notes (Signed)
Pt refusing CBG check. Stating she will hit the NT if they touch her.

## 2022-10-15 DIAGNOSIS — M5442 Lumbago with sciatica, left side: Secondary | ICD-10-CM

## 2022-10-15 DIAGNOSIS — M5441 Lumbago with sciatica, right side: Secondary | ICD-10-CM | POA: Diagnosis not present

## 2022-10-15 DIAGNOSIS — N186 End stage renal disease: Secondary | ICD-10-CM | POA: Diagnosis not present

## 2022-10-15 DIAGNOSIS — M4620 Osteomyelitis of vertebra, site unspecified: Secondary | ICD-10-CM | POA: Diagnosis not present

## 2022-10-15 LAB — BASIC METABOLIC PANEL
Anion gap: 8 (ref 5–15)
BUN: 18 mg/dL (ref 8–23)
CO2: 27 mmol/L (ref 22–32)
Calcium: 9.3 mg/dL (ref 8.9–10.3)
Chloride: 99 mmol/L (ref 98–111)
Creatinine, Ser: 4.83 mg/dL — ABNORMAL HIGH (ref 0.44–1.00)
GFR, Estimated: 9 mL/min — ABNORMAL LOW (ref >60)
Glucose, Bld: 179 mg/dL — ABNORMAL HIGH (ref 70–99)
Potassium: 3 mmol/L — ABNORMAL LOW (ref 3.5–5.1)
Sodium: 134 mmol/L — ABNORMAL LOW (ref 135–145)

## 2022-10-15 LAB — GLUCOSE, CAPILLARY
Glucose-Capillary: 159 mg/dL — ABNORMAL HIGH (ref 70–99)
Glucose-Capillary: 170 mg/dL — ABNORMAL HIGH (ref 70–99)
Glucose-Capillary: 130 mg/dL — ABNORMAL HIGH (ref 70–99)
Glucose-Capillary: 134 mg/dL — ABNORMAL HIGH (ref 70–99)

## 2022-10-15 LAB — CBC
HCT: 32.9 % — ABNORMAL LOW (ref 36.0–46.0)
Hemoglobin: 9.6 g/dL — ABNORMAL LOW (ref 12.0–15.0)
MCH: 23.8 pg — ABNORMAL LOW (ref 26.0–34.0)
MCHC: 29.2 g/dL — ABNORMAL LOW (ref 30.0–36.0)
MCV: 81.6 fL (ref 80.0–100.0)
Platelets: 226 10*3/uL (ref 150–400)
RBC: 4.03 MIL/uL (ref 3.87–5.11)
RDW: 21.1 % — ABNORMAL HIGH (ref 11.5–15.5)
WBC: 8.8 10*3/uL (ref 4.0–10.5)
nRBC: 0.2 % (ref 0.0–0.2)

## 2022-10-15 MED ORDER — HEPARIN SODIUM (PORCINE) 5000 UNIT/ML IJ SOLN
5000.0000 [IU] | Freq: Three times a day (TID) | INTRAMUSCULAR | Status: DC
  Administered 2022-10-15 – 2022-11-01 (×46): 5000 [IU] via SUBCUTANEOUS
  Filled 2022-10-15 (×50): qty 1

## 2022-10-15 NOTE — Progress Notes (Signed)
PROGRESS NOTE    Cynthia Dean  [id number] DOB: 1951-03-11 DOA: 10/12/2022 PCP: Lowella Bandy, MD    Assessment & Plan:   Principal Problem:   Back pain Active Problems:   Anemia   ESRD on dialysis [facility])   Essential hypertension   Dyslipidemia   Seizure disorder ([place])   Diabetic retinopathy without macular edema associated with type 2 diabetes mellitus (HCC)   GERD (gastroesophageal reflux disease)   Diabetic neuropathy (HCC)   Anemia of chronic disease   Insulin-requiring or dependent type II diabetes mellitus ([place])   Discitis   Myositis  Assessment and Plan: No notes have been filed under this hospital service. Service: Hospitalist  Possible Discitis/osteomyelitis at L4-L5: w/ suspected intradiscal abscess as per MRI. S/p CT aspiration of right psoas muscle abscess yielding 15mL of purulent material. Cx shows NGTD. Continue on IV cefepime, vanco. Blood cxs NGTD. ID following and recs apprec   Possible Infectious myositis: of the adjacent psoas muscles with bilateral psoas abscesses, worse on the right as per MRI. Continue on IV cefepime, vanco as per ID. Cx is NGTD. ID following and recs apprec    ESRD: on HD MWF. Nephro following and recs apprec   Generalized weakness: PT/OT consulted   ACD: likely secondary to ESRD. H&H are stable   Hypokalemia: will be managed w/ HD    Hx of seizures: continue on home dose of keppra    HLD: continue on statin   Hx of COPD: w/o exacerbation. Continue on bronchodilators    DM2: HbA1c 5.2, well controlled. Continue on SSI w/ accuchecks   Peripheral neuropathy: likely secondary to DM2. Continue on home dose of gabapentin    HTN: continue on metoprolol    GERD: continue on PPI    Depression: severity unknown. Continue on home dose of sertraline   Overweight: BMI 28.2. Would benefit from weight loss        DVT prophylaxis: heparin SQ Code Status: full  Family Communication:  Disposition Plan: PT/OT  consulted   Level of care: Med-Surg Status is: Inpatient Remains inpatient appropriate because: severity of illness    Consultants:  ID IR Neuro surg   Procedures:   Antimicrobials:   Subjective: Pt c/o malaise  Objective: Vitals:   10/14/22 1240 10/14/22 1520 10/14/22 2040 10/15/22 0413  BP: (!) 141/59 (!) 128/44 (!) 125/53 128/64  Pulse: 81 85 89 85  Resp: 18 19 19 20   Temp: 98.4 F (36.9 C) 98.5 F (36.9 C) 98 F (36.7 C) 98.6 F (37 C)  TempSrc:  Oral Oral   SpO2: 100% (!) 89% 100% 91%  Weight:      Height:        Intake/Output Summary (Last 24 hours) at 10/15/2022 0732 Last data filed at 10/14/2022 2255 Gross per 24 hour  Intake 333.33 ml  Output 2300 ml  Net -1966.67 ml   Filed Weights   10/12/22 1440 10/14/22 0759 10/14/22 1159  Weight: 94.4 kg 96.5 kg 95.4 kg    Examination:  General exam: Appears uncomfortable Respiratory system: decreased breath sounds b/l Cardiovascular system: S1 & S2+. No rubs or clicks Gastrointestinal system: Abd is soft, NT, ND & hypoactive bowel sounds Central nervous system: Alert and awake. Moves all extremities  Psychiatry: judgement and insight appears poor. Flat mood and affect     Data Reviewed: I have personally reviewed following labs and imaging studies  CBC: Recent Labs  Lab 10/12/22 0605 10/13/22 1146 10/14/22 0810 10/15/22 0615  WBC  10.2 7.2 7.7 8.8  NEUTROABS 7.6  --   --   --   HGB 9.6* 9.2* 9.4* 9.6*  HCT 35.0* 31.3* 31.7* 32.9*  MCV 86.0 82.4 82.8 81.6  PLT 199 221 224 [id number]   Basic Metabolic Panel: Recent Labs  Lab 10/12/22 0605 10/13/22 1146 10/14/22 0810 10/15/22 0615  NA 138 135 137 134*  K 3.1* 3.2* 3.2* 3.0*  CL 105 95* 99 99  CO2 24 27 26 27   GLUCOSE 109* 100* 91 179*  BUN 24* 18 25* 18  CREATININE 6.68* 5.09* 6.30* 4.83*  CALCIUM 9.1 8.8* 9.2 9.3   GFR: Estimated Creatinine Clearance: 13.8 mL/min (A) (by C-G formula based on SCr of 4.83 mg/dL (H)). Liver Function  Tests: No results for input(s): "AST", "ALT", "ALKPHOS", "BILITOT", "PROT", "ALBUMIN" in the last 168 hours. No results for input(s): "LIPASE", "AMYLASE" in the last 168 hours. No results for input(s): "AMMONIA" in the last 168 hours. Coagulation Profile: No results for input(s): "INR", "PROTIME" in the last 168 hours. Cardiac Enzymes: No results for input(s): "CKTOTAL", "CKMB", "CKMBINDEX", "TROPONINI" in the last 168 hours. BNP (last 3 results) No results for input(s): "PROBNP" in the last 8760 hours. HbA1C: Recent Labs    10/13/22 1146  HGBA1C 5.2   CBG: Recent Labs  Lab 10/13/22 1630 10/13/22 2033 10/14/22 1243 10/14/22 1704 10/14/22 2042  GLUCAP 88 99 90 114* 136*   Lipid Profile: No results for input(s): "CHOL", "HDL", "LDLCALC", "TRIG", "CHOLHDL", "LDLDIRECT" in the last 72 hours. Thyroid Function Tests: No results for input(s): "TSH", "T4TOTAL", "FREET4", "T3FREE", "THYROIDAB" in the last 72 hours. Anemia Panel: No results for input(s): "VITAMINB12", "FOLATE", "FERRITIN", "TIBC", "IRON", "RETICCTPCT" in the last 72 hours. Sepsis Labs: Recent Labs  Lab 10/12/22 1740 10/13/22 0006  LATICACIDVEN 1.5 0.8    Recent Results (from the past 240 hour(s))  Blood culture (routine x 2)     Status: None (Preliminary result)   Collection Time: 10/12/22  5:40 PM   Specimen: BLOOD  Result Value Ref Range Status   Specimen Description BLOOD BLOOD RIGHT HAND [facility]  Final   Special Requests   Final    BOTTLES DRAWN AEROBIC AND ANAEROBIC Blood Culture results may not be optimal due to an inadequate volume of blood received in culture bottles   Culture   Final    NO GROWTH 3 DAYS Performed at [facility], [address] [place], [place] [address]    Report Status PENDING  Incomplete  Blood culture (routine x 2)     Status: None (Preliminary result)   Collection Time: 10/12/22  6:56 PM   Specimen: BLOOD  Result Value Ref Range Status   Specimen Description BLOOD  BLOOD RIGHT FOREARM  Final   Special Requests   Final    BOTTLES DRAWN AEROBIC ONLY Blood Culture results may not be optimal due to an inadequate volume of blood received in culture bottles   Culture   Final    NO GROWTH 3 DAYS Performed at [facility], [address] [place], [place] [address]    Report Status PENDING  Incomplete  Aerobic/Anaerobic Culture w Gram Stain (surgical/deep wound)     Status: None (Preliminary result)   Collection Time: 10/13/22 11:44 AM   Specimen: Abscess  Result Value Ref Range Status   Specimen Description   Final    ABSCESS Performed at [facility], [address] [place], [place] [address]    Special Requests   Final  MUSCLE ABSCESS Performed at [facility], [address] [place], [place] [address]    Gram Stain   Final    ABUNDANT WBC PRESENT, PREDOMINANTLY PMN NO ORGANISMS SEEN    Culture   Final    CULTURE REINCUBATED FOR BETTER GROWTH Performed at [facility], [place] [address] [place], [place] [address]    Report Status PENDING  Incomplete         Radiology Studies: CT ASPIRATION N/S  Result Date: 10/13/2022 INDICATION: Right psoas abscess EXAM: CT-guided aspiration of right psoas abscess MEDICATIONS: Documented in the EMR ANESTHESIA/SEDATION: Local analgesia COMPLICATIONS: None immediate. PROCEDURE: Informed written consent was obtained from the patient after a thorough discussion of the procedural risks, benefits and alternatives. All questions were addressed. Maximal Sterile Barrier Technique was utilized including caps, mask, sterile gowns, sterile gloves, sterile drape, hand hygiene and skin antiseptic. A timeout was performed prior to the initiation of the procedure. The patient was placed prone on the exam table. Limited CT of the abdomen and pelvis was performed for planning purposes. This demonstrated small fluid collection in the right psoas muscle. Skin entry site was marked, and the  overlying skin was prepped and draped in the standard sterile fashion. Local analgesia was obtained with 1% lidocaine. Using intermittent CT fluoroscopy, a 19 gauge Yueh catheter was advanced into the identified small fluid collection in the right psoas muscle. Approximately 2 mL of purulent fluid was aspirated. This was sent to the lab for microbiology analysis. Needle was removed, and a clean dressing was placed. The patient tolerated the procedure well without immediate complication. IMPRESSION: Successful CT-guided aspiration of right psoas abscess, yielding 2 mL of purulent fluid. Sample sent for microbiology analysis. Electronically Signed   By: Albin Felling M.D.   On: 10/13/2022 11:56        Scheduled Meds:  (feeding supplement) PROSource Plus  30 mL Oral BID BM   atorvastatin  80 mg Oral QHS   Chlorhexidine Gluconate Cloth  6 each Topical Q0600   feeding supplement (NEPRO CARB STEADY)  237 mL Oral TID BM   gabapentin  100 mg Oral BID   insulin aspart  0-9 Units Subcutaneous TID WC   levETIRAcetam  1,000 mg Oral Daily   levETIRAcetam  250 mg Oral Q M,W,F   losartan  100 mg Oral Daily   metoprolol succinate  100 mg Oral Daily   multivitamin  1 tablet Oral QHS   nystatin  5 mL Oral QID   pantoprazole  20 mg Oral BID   sertraline  25 mg Oral Daily   sodium chloride flush  10-40 mL Intracatheter Q12H   umeclidinium-vilanterol  1 puff Inhalation Daily   Continuous Infusions:  anticoagulant sodium citrate     ceFEPime (MAXIPIME) IV Stopped (10/14/22 1347)   vancomycin Stopped (10/14/22 1155)     LOS: 3 days    Time spent: 25 mins     Wyvonnia Dusky, MD [facility] Pager [phone]  If 7PM-7AM, please contact night-coverage [email or web address] 10/15/2022, 7:32 AM

## 2022-10-15 NOTE — Plan of Care (Signed)

## 2022-10-15 NOTE — Progress Notes (Signed)
[facility]  ROUNDING NOTE   Subjective:   Cynthia Dean is a 72 y.o. 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 She presents to the ED with worsening lower extremity pain. Patient will be admitted for Back pain [M54.9] Bilateral leg pain [M79.604, M79.605]  Patient is known to our practice and receives outpatient dialysis treatments at [facility] on a MWF schedule, supervised by Dr Candiss Norse.   Patient seen lying in bed with head elevated with nurse assistant feeding her breakfast. Patient calling out  "someone help me" . Dr. Candiss Norse provided emotional support.  Objective:  Vital signs in last 24 hours:  Temp:  [97.9 F (36.6 C)-98.6 F (37 C)] 97.9 F (36.6 C) (03/23 0733) Pulse Rate:  [77-89] 86 (03/23 0733) Resp:  [15-25] 15 (03/23 0733) BP: (125-171)/(44-67) 128/55 (03/23 0733) SpO2:  [89 %-100 %] 89 % (03/23 0733) Weight:  [95.4 kg] 95.4 kg (03/22 1159)  Weight change:  Filed Weights   10/12/22 1440 10/14/22 0759 10/14/22 1159  Weight: 94.4 kg 96.5 kg 95.4 kg    Intake/Output: I/O last 3 completed shifts: In: 333.3 [IV Piggyback:333.3] Out: 2300 [Urine:800; Other:1500]   Intake/Output this shift:  No intake/output data recorded.  Physical Exam: General: Calling out, lying in bed   Head: Normocephalic, atraumatic. Moist oral mucosal membranes  Eyes: Anicteric  Lungs:  Clear to auscultation, normal effort  Heart: Regular rate and rhythm  Abdomen:  Soft, nontender  Extremities: No peripheral edema.  Neurologic: Alert and oriented to self, moving all four extremities  Skin: No lesions  Access: Left AVG    Basic Metabolic Panel: Recent Labs  Lab 10/12/22 0605 10/13/22 1146 10/14/22 0810 10/15/22 0615  NA 138 135 137 134*  K 3.1* 3.2* 3.2* 3.0*  CL 105 95* 99 99  CO2 24 27 26 27   GLUCOSE 109* 100* 91  179*  BUN 24* 18 25* 18  CREATININE 6.68* 5.09* 6.30* 4.83*  CALCIUM 9.1 8.8* 9.2 9.3     Liver Function Tests: No results for input(s): "AST", "ALT", "ALKPHOS", "BILITOT", "PROT", "ALBUMIN" in the last 168 hours. No results for input(s): "LIPASE", "AMYLASE" in the last 168 hours. No results for input(s): "AMMONIA" in the last 168 hours.  CBC: Recent Labs  Lab 10/12/22 0605 10/13/22 1146 10/14/22 0810 10/15/22 0615  WBC 10.2 7.2 7.7 8.8  NEUTROABS 7.6  --   --   --   HGB 9.6* 9.2* 9.4* 9.6*  HCT 35.0* 31.3* 31.7* 32.9*  MCV 86.0 82.4 82.8 81.6  PLT 199 221 224 226     Cardiac Enzymes: No results for input(s): "CKTOTAL", "CKMB", "CKMBINDEX", "TROPONINI" in the last 168 hours.  BNP: Invalid input(s): "POCBNP"  CBG: Recent Labs  Lab 10/13/22 2033 10/14/22 1243 10/14/22 1704 10/14/22 2042 10/15/22 0742  GLUCAP 99 90 114* 136* 159*     Microbiology: Results for orders placed or performed during the hospital encounter of 10/12/22  Blood culture (routine x 2)     Status: None (Preliminary result)   Collection Time: 10/12/22  5:40 PM   Specimen: BLOOD  Result Value Ref Range Status   Specimen Description BLOOD BLOOD RIGHT HAND [facility]  Final   Special Requests   Final    BOTTLES DRAWN AEROBIC AND ANAEROBIC Blood Culture results may not be optimal due to an inadequate volume of blood received in culture bottles   Culture  Final    NO GROWTH 3 DAYS Performed at [facility], [address] [place], [place] [address]    Report Status PENDING  Incomplete  Blood culture (routine x 2)     Status: None (Preliminary result)   Collection Time: 10/12/22  6:56 PM   Specimen: BLOOD  Result Value Ref Range Status   Specimen Description BLOOD BLOOD RIGHT FOREARM  Final   Special Requests   Final    BOTTLES DRAWN AEROBIC ONLY Blood Culture results may not be optimal due to an inadequate volume of blood received in culture bottles   Culture   Final    NO GROWTH 3  DAYS Performed at [facility], [address], [place], [place] [address]    Report Status PENDING  Incomplete  Aerobic/Anaerobic Culture w Gram Stain (surgical/deep wound)     Status: None (Preliminary result)   Collection Time: 10/13/22 11:44 AM   Specimen: Abscess  Result Value Ref Range Status   Specimen Description   Final    ABSCESS Performed at [facility], [address] [place], [place] [address]    Special Requests   Final    MUSCLE ABSCESS Performed at [facility], [address] [place], [place] [address]    Gram Stain   Final    ABUNDANT WBC PRESENT, PREDOMINANTLY PMN NO ORGANISMS SEEN    Culture   Final    CULTURE REINCUBATED FOR BETTER GROWTH Performed at [facility], [place] [address], [place], [place] [address]    Report Status PENDING  Incomplete    Coagulation Studies: No results for input(s): "LABPROT", "INR" in the last 72 hours.  Urinalysis: No results for input(s): "COLORURINE", "LABSPEC", "PHURINE", "GLUCOSEU", "HGBUR", "BILIRUBINUR", "KETONESUR", "PROTEINUR", "UROBILINOGEN", "NITRITE", "LEUKOCYTESUR" in the last 72 hours.  Invalid input(s): "APPERANCEUR"    Imaging: No results found.   Medications:    anticoagulant sodium citrate     ceFEPime (MAXIPIME) IV Stopped (10/14/22 1347)   vancomycin Stopped (10/14/22 1155)    (feeding supplement) PROSource Plus  30 mL Oral BID BM   atorvastatin  80 mg Oral QHS   Chlorhexidine Gluconate Cloth  6 each Topical Q0600   feeding supplement (NEPRO CARB STEADY)  237 mL Oral TID BM   gabapentin  100 mg Oral BID   heparin injection (subcutaneous)  5,000 Units Subcutaneous Q8H   insulin aspart  0-9 Units Subcutaneous TID WC   levETIRAcetam  1,000 mg Oral Daily   levETIRAcetam  250 mg Oral Q M,W,F   losartan  100 mg Oral Daily   metoprolol succinate  100 mg Oral Daily   multivitamin  1 tablet Oral QHS   nystatin  5 mL Oral QID   pantoprazole  20 mg Oral BID    sertraline  25 mg Oral Daily   sodium chloride flush  10-40 mL Intracatheter Q12H   umeclidinium-vilanterol  1 puff Inhalation Daily   acetaminophen **OR** acetaminophen, alteplase, anticoagulant sodium citrate, heparin, hydrALAZINE, lidocaine (PF), lidocaine-prilocaine, morphine injection, pentafluoroprop-tetrafluoroeth, senna-docusate, sodium chloride flush, traMADol  Assessment/ Plan:  Cynthia Dean is a 72 y.o.  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 She presents to the ED with worsening lower extremity pain. Patient will be admitted for Back pain [M54.9] Bilateral leg pain [M79.604, M79.605]  [facility]/MWF/Lt AVG /102.5kg  End-stage renal disease on hemodialysis.  Will maintain  outpatient schedule if possible.  Next treatment scheduled for Monday  2. Anemia of chronic kidney disease Normocytic Lab Results  Component Value Date   HGB 9.6 (L) 10/15/2022  Patient receives Mircera at outpatient clinic.  Hemoglobin within optimal range.  3. Secondary Hyperparathyroidism: with outpatient labs: PTH 180 (3/11), phosphorus 5.4, calcium 9.3   Lab Results  Component Value Date   PTH 131 (H) 03/14/2018   CALCIUM 9.3 10/15/2022   CAION 1.09 (L) 08/28/2022   PHOS 5.4 (H) 09/09/2022   Bone minerals acceptable Will continue to monitor during this admission  4.  Hypertension with chronic kidney disease.  Home regimen includes losartan, metoprolol, and torsemide.  Prescribed losartan and metoprolol.   Blood pressure 128/55  5. Diabetes mellitus type II with chronic kidney disease/renal manifestations: insulin dependent. Home regimen includes Novolin 70/30. Most recent hemoglobin A1c is 5.8 on 03/23/22.   SSI managed by primary team  6. Vertebral osteomyelitis, seen on CT lumber spine. Neurosurgery feels patient is not a surgical  candidate Recommend antibiotics. ID following. CT guided aspiration of psoas abscess on 10/13/22.   Receiving Cefepime and vancomycin     LOS: 3 Marcelle Bebout P [place] Depaz 3/23/202411:50 AM

## 2022-10-16 DIAGNOSIS — M5441 Lumbago with sciatica, right side: Secondary | ICD-10-CM | POA: Diagnosis not present

## 2022-10-16 DIAGNOSIS — M4620 Osteomyelitis of vertebra, site unspecified: Secondary | ICD-10-CM | POA: Diagnosis not present

## 2022-10-16 DIAGNOSIS — N186 End stage renal disease: Secondary | ICD-10-CM | POA: Diagnosis not present

## 2022-10-16 DIAGNOSIS — M5442 Lumbago with sciatica, left side: Secondary | ICD-10-CM | POA: Diagnosis not present

## 2022-10-16 LAB — CBC
HCT: 33 % — ABNORMAL LOW (ref 36.0–46.0)
Hemoglobin: 9.5 g/dL — ABNORMAL LOW (ref 12.0–15.0)
MCH: 23.8 pg — ABNORMAL LOW (ref 26.0–34.0)
MCHC: 28.8 g/dL — ABNORMAL LOW (ref 30.0–36.0)
MCV: 82.5 fL (ref 80.0–100.0)
Platelets: 200 10*3/uL (ref 150–400)
RBC: 4 MIL/uL (ref 3.87–5.11)
RDW: 21.3 % — ABNORMAL HIGH (ref 11.5–15.5)
WBC: 9.4 10*3/uL (ref 4.0–10.5)
nRBC: 0.2 % (ref 0.0–0.2)

## 2022-10-16 LAB — BASIC METABOLIC PANEL
Anion gap: 6 (ref 5–15)
BUN: 26 mg/dL — ABNORMAL HIGH (ref 8–23)
CO2: 27 mmol/L (ref 22–32)
Calcium: 9.9 mg/dL (ref 8.9–10.3)
Chloride: 103 mmol/L (ref 98–111)
Creatinine, Ser: 6.31 mg/dL — ABNORMAL HIGH (ref 0.44–1.00)
GFR, Estimated: 7 mL/min — ABNORMAL LOW (ref >60)
Glucose, Bld: 172 mg/dL — ABNORMAL HIGH (ref 70–99)
Potassium: 3.3 mmol/L — ABNORMAL LOW (ref 3.5–5.1)
Sodium: 136 mmol/L (ref 135–145)

## 2022-10-16 LAB — GLUCOSE, CAPILLARY
Glucose-Capillary: 157 mg/dL — ABNORMAL HIGH (ref 70–99)
Glucose-Capillary: 121 mg/dL — ABNORMAL HIGH (ref 70–99)
Glucose-Capillary: 125 mg/dL — ABNORMAL HIGH (ref 70–99)
Glucose-Capillary: 112 mg/dL — ABNORMAL HIGH (ref 70–99)
Glucose-Capillary: 118 mg/dL — ABNORMAL HIGH (ref 70–99)

## 2022-10-16 NOTE — Progress Notes (Signed)
Patient confused and refusing medication. Does not allow me to touch her. Fiance tried talking to her,did not respond to him just kept talking. I gave her some time and went back to try administer med. Same scenario 0ccured

## 2022-10-16 NOTE — Progress Notes (Signed)
PROGRESS NOTE    Cynthia Dean  [id number] DOB: 1951-01-04 DOA: 10/12/2022 PCP: Lowella Bandy, MD    Assessment & Plan:   Principal Problem:   Back pain Active Problems:   Anemia   ESRD on dialysis [facility])   Essential hypertension   Dyslipidemia   Seizure disorder ([place])   Diabetic retinopathy without macular edema associated with type 2 diabetes mellitus (HCC)   GERD (gastroesophageal reflux disease)   Diabetic neuropathy (HCC)   Anemia of chronic disease   Insulin-requiring or dependent type II diabetes mellitus ([place])   Discitis   Myositis  Assessment and Plan:  Possible Discitis/osteomyelitis at L4-L5: w/ suspected intradiscal abscess as per MRI. S/p CT aspiration of right psoas muscle abscess yielding 59mL of purulent material. Cx growing staph capitis, sens pending. Continue on IV vanco, cefepime. Blood cxs NGTD. ID following and recs apprec    Possible Infectious myositis: of the adjacent psoas muscles with bilateral psoas abscesses, worse on the right as per MRI. Continue on IV vanco, cefepime as per ID. Cx growing staph capitis. ID following and recs apprec    ESRD: on HD MWF. Nephro following and recs apprec   Generalized weakness: PT/OT consulted  ACD: likely secondary ESRD. H&H are stable   Hypokalemia: management w/ HD    Hx of seizures: continue on home dose of keppra    HLD: continue on statin   Hx of COPD: w/o exacerbation. Continue on bronchodilators    DM2: well controlled, HbA1c 5.2. Continue on SSI w/ accuchecks   Peripheral neuropathy: likely secondary to DM2. Continue on home dose of gabapentin    HTN: continue on BB    GERD: continue on PPI    Depression: severity unknown. Continue on home dose of sertraline    Overweight: BMI 28.2. Would benefit from weight loss        DVT prophylaxis: heparin SQ Code Status: full  Family Communication:  Disposition Plan: PT/OT consulted   Level of care: Med-Surg Status is:  Inpatient Remains inpatient appropriate because: severity of illness    Consultants:  ID IR Neuro surg   Procedures:   Antimicrobials:   Subjective: Pt c/o back pain   Objective: Vitals:   10/15/22 0733 10/15/22 1610 10/15/22 2206 10/16/22 0617  BP: (!) 128/55 (!) 141/72 (!) 143/71 (!) 155/68  Pulse: 86 84 92 91  Resp: 15  16   Temp: 97.9 F (36.6 C) 98.6 F (37 C) 97.9 F (36.6 C)   TempSrc:   Oral   SpO2: (!) 89% 100% 94%   Weight:      Height:        Intake/Output Summary (Last 24 hours) at 10/16/2022 0725 Last data filed at 10/16/2022 0043 Gross per 24 hour  Intake 100 ml  Output --  Net 100 ml   Filed Weights   10/12/22 1440 10/14/22 0759 10/14/22 1159  Weight: 94.4 kg 96.5 kg 95.4 kg    Examination:  General exam: Appears comfortable  Respiratory system: clear breath sounds b/l Cardiovascular system: S1/S2+. No rubs or clicks Gastrointestinal system: Abd is soft, NT, obese & hypoactive bowel sounds  Central nervous system: Alert and awake. Moves all extremities   Psychiatry: judgement and insight appears poor. Flat mood and affect    Data Reviewed: I have personally reviewed following labs and imaging studies  CBC: Recent Labs  Lab 10/12/22 0605 10/13/22 1146 10/14/22 0810 10/15/22 0615 10/16/22 0545  WBC 10.2 7.2 7.7 8.8 9.4  NEUTROABS 7.6  --   --   --   --  HGB 9.6* 9.2* 9.4* 9.6* 9.5*  HCT 35.0* 31.3* 31.7* 32.9* 33.0*  MCV 86.0 82.4 82.8 81.6 82.5  PLT 199 221 224 226 [id number]   Basic Metabolic Panel: Recent Labs  Lab 10/12/22 0605 10/13/22 1146 10/14/22 0810 10/15/22 0615 10/16/22 0545  NA 138 135 137 134* 136  K 3.1* 3.2* 3.2* 3.0* 3.3*  CL 105 95* 99 99 103  CO2 24 27 26 27 27   GLUCOSE 109* 100* 91 179* 172*  BUN 24* 18 25* 18 26*  CREATININE 6.68* 5.09* 6.30* 4.83* 6.31*  CALCIUM 9.1 8.8* 9.2 9.3 9.9   GFR: Estimated Creatinine Clearance: 10.6 mL/min (A) (by C-G formula based on SCr of 6.31 mg/dL (H)). Liver Function  Tests: No results for input(s): "AST", "ALT", "ALKPHOS", "BILITOT", "PROT", "ALBUMIN" in the last 168 hours. No results for input(s): "LIPASE", "AMYLASE" in the last 168 hours. No results for input(s): "AMMONIA" in the last 168 hours. Coagulation Profile: No results for input(s): "INR", "PROTIME" in the last 168 hours. Cardiac Enzymes: No results for input(s): "CKTOTAL", "CKMB", "CKMBINDEX", "TROPONINI" in the last 168 hours. BNP (last 3 results) No results for input(s): "PROBNP" in the last 8760 hours. HbA1C: Recent Labs    10/13/22 1146  HGBA1C 5.2   CBG: Recent Labs  Lab 10/14/22 2042 10/15/22 0742 10/15/22 1153 10/15/22 1625 10/15/22 2158  GLUCAP 136* 159* 170* 130* 134*   Lipid Profile: No results for input(s): "CHOL", "HDL", "LDLCALC", "TRIG", "CHOLHDL", "LDLDIRECT" in the last 72 hours. Thyroid Function Tests: No results for input(s): "TSH", "T4TOTAL", "FREET4", "T3FREE", "THYROIDAB" in the last 72 hours. Anemia Panel: No results for input(s): "VITAMINB12", "FOLATE", "FERRITIN", "TIBC", "IRON", "RETICCTPCT" in the last 72 hours. Sepsis Labs: Recent Labs  Lab 10/12/22 1740 10/13/22 0006  LATICACIDVEN 1.5 0.8    Recent Results (from the past 240 hour(s))  Blood culture (routine x 2)     Status: None (Preliminary result)   Collection Time: 10/12/22  5:40 PM   Specimen: BLOOD  Result Value Ref Range Status   Specimen Description BLOOD BLOOD RIGHT HAND [facility]  Final   Special Requests   Final    BOTTLES DRAWN AEROBIC AND ANAEROBIC Blood Culture results may not be optimal due to an inadequate volume of blood received in culture bottles   Culture   Final    NO GROWTH 4 DAYS Performed at [facility], [address], [place], [place] [address]    Report Status PENDING  Incomplete  Blood culture (routine x 2)     Status: None (Preliminary result)   Collection Time: 10/12/22  6:56 PM   Specimen: BLOOD  Result Value Ref Range Status   Specimen Description  BLOOD BLOOD RIGHT FOREARM  Final   Special Requests   Final    BOTTLES DRAWN AEROBIC ONLY Blood Culture results may not be optimal due to an inadequate volume of blood received in culture bottles   Culture   Final    NO GROWTH 4 DAYS Performed at [facility], [address] [place], [place] [address]    Report Status PENDING  Incomplete  Aerobic/Anaerobic Culture w Gram Stain (surgical/deep wound)     Status: None (Preliminary result)   Collection Time: 10/13/22 11:44 AM   Specimen: Abscess  Result Value Ref Range Status   Specimen Description   Final    ABSCESS Performed at [facility], [address] [place], [place] [address]    Special Requests   Final    MUSCLE  ABSCESS Performed at [facility], [address] [place], [place] [address]    Gram Stain   Final    ABUNDANT WBC PRESENT, PREDOMINANTLY PMN NO ORGANISMS SEEN Performed at [facility], [place] [address] [place], [place] [address]    Culture   Final    FEW STAPHYLOCOCCUS CAPITIS SUSCEPTIBILITIES TO FOLLOW NO ANAEROBES ISOLATED; CULTURE IN PROGRESS FOR 5 DAYS    Report Status PENDING  Incomplete         Radiology Studies: No results found.      Scheduled Meds:  (feeding supplement) PROSource Plus  30 mL Oral BID BM   atorvastatin  80 mg Oral QHS   Chlorhexidine Gluconate Cloth  6 each Topical Q0600   feeding supplement (NEPRO CARB STEADY)  237 mL Oral TID BM   gabapentin  100 mg Oral BID   heparin injection (subcutaneous)  5,000 Units Subcutaneous Q8H   insulin aspart  0-9 Units Subcutaneous TID WC   levETIRAcetam  1,000 mg Oral Daily   levETIRAcetam  250 mg Oral Q M,W,F   losartan  100 mg Oral Daily   metoprolol succinate  100 mg Oral Daily   multivitamin  1 tablet Oral QHS   nystatin  5 mL Oral QID   pantoprazole  20 mg Oral BID   sertraline  25 mg Oral Daily   sodium chloride flush  10-40 mL Intracatheter Q12H   umeclidinium-vilanterol  1 puff  Inhalation Daily   Continuous Infusions:  anticoagulant sodium citrate     ceFEPime (MAXIPIME) IV Stopped (10/15/22 1450)   vancomycin Stopped (10/14/22 1155)     LOS: 4 days    Time spent: 25 mins     Wyvonnia Dusky, MD [facility] Pager [phone]  If 7PM-7AM, please contact night-coverage [email or web address] 10/16/2022, 7:25 AM

## 2022-10-16 NOTE — Progress Notes (Signed)
[facility]  ROUNDING NOTE   Subjective:   Cynthia Dean is a 72 y.o. 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 She presents to the ED with worsening lower extremity pain. Patient will be admitted for Back pain [M54.9] Bilateral leg pain [M79.604, M79.605]  Patient is known to our practice and receives outpatient dialysis treatments at [facility] on a MWF schedule, supervised by Dr Candiss Norse.   Patient seen lying in bed with head elevated. Patient requesting the bed rails be removed. Patient frequently repeating statements.   Objective:  Vital signs in last 24 hours:  Temp:  [97.9 F (36.6 C)-98.6 F (37 C)] 98.6 F (37 C) (03/24 0810) Pulse Rate:  [84-92] 85 (03/24 0810) Resp:  [16-20] 20 (03/24 0810) BP: (113-155)/(42-72) 113/42 (03/24 0810) SpO2:  [94 %-100 %] 96 % (03/24 0810)  Weight change:  Filed Weights   10/12/22 1440 10/14/22 0759 10/14/22 1159  Weight: 94.4 kg 96.5 kg 95.4 kg    Intake/Output: I/O last 3 completed shifts: In: 100 [IV Piggyback:100] Out: 800 [Urine:800]   Intake/Output this shift:  No intake/output data recorded.  Physical Exam: General: Lying in bed.   Head: Normocephalic, atraumatic. Moist oral mucosal membranes  Eyes: Anicteric  Lungs:  Clear to auscultation, normal effort  Heart: Regular rate and rhythm  Abdomen:  Soft, nontender  Extremities: Trace peripheral edema.  Neurologic: Alert and oriented to self, moving all four extremities  Skin: No lesions  Access: Left AVG    Basic Metabolic Panel: Recent Labs  Lab 10/12/22 0605 10/13/22 1146 10/14/22 0810 10/15/22 0615 10/16/22 0545  NA 138 135 137 134* 136  K 3.1* 3.2* 3.2* 3.0* 3.3*  CL 105 95* 99 99 103  CO2 24 27 26 27 27   GLUCOSE 109* 100* 91 179* 172*  BUN 24* 18 25* 18 26*  CREATININE 6.68* 5.09* 6.30* 4.83*  6.31*  CALCIUM 9.1 8.8* 9.2 9.3 9.9     Liver Function Tests: No results for input(s): "AST", "ALT", "ALKPHOS", "BILITOT", "PROT", "ALBUMIN" in the last 168 hours. No results for input(s): "LIPASE", "AMYLASE" in the last 168 hours. No results for input(s): "AMMONIA" in the last 168 hours.  CBC: Recent Labs  Lab 10/12/22 0605 10/13/22 1146 10/14/22 0810 10/15/22 0615 10/16/22 0545  WBC 10.2 7.2 7.7 8.8 9.4  NEUTROABS 7.6  --   --   --   --   HGB 9.6* 9.2* 9.4* 9.6* 9.5*  HCT 35.0* 31.3* 31.7* 32.9* 33.0*  MCV 86.0 82.4 82.8 81.6 82.5  PLT 199 221 224 226 200     Cardiac Enzymes: No results for input(s): "CKTOTAL", "CKMB", "CKMBINDEX", "TROPONINI" in the last 168 hours.  BNP: Invalid input(s): "POCBNP"  CBG: Recent Labs  Lab 10/15/22 0742 10/15/22 1153 10/15/22 1625 10/15/22 2158 10/16/22 0808  GLUCAP 159* 170* 130* 134* 157*     Microbiology: Results for orders placed or performed during the hospital encounter of 10/12/22  Blood culture (routine x 2)     Status: None (Preliminary result)   Collection Time: 10/12/22  5:40 PM   Specimen: BLOOD  Result Value Ref Range Status   Specimen Description BLOOD BLOOD RIGHT HAND [facility]  Final   Special Requests   Final    BOTTLES DRAWN AEROBIC AND ANAEROBIC Blood Culture results may not be optimal due to an inadequate volume of blood received in culture bottles  Culture   Final    NO GROWTH 4 DAYS Performed at [facility], [address] [place], [place] [address]    Report Status PENDING  Incomplete  Blood culture (routine x 2)     Status: None (Preliminary result)   Collection Time: 10/12/22  6:56 PM   Specimen: BLOOD  Result Value Ref Range Status   Specimen Description BLOOD BLOOD RIGHT FOREARM  Final   Special Requests   Final    BOTTLES DRAWN AEROBIC ONLY Blood Culture results may not be optimal due to an inadequate volume of blood received in culture bottles   Culture   Final    NO GROWTH 4  DAYS Performed at [facility], [address] [place], [place] [address]    Report Status PENDING  Incomplete  Aerobic/Anaerobic Culture w Gram Stain (surgical/deep wound)     Status: None (Preliminary result)   Collection Time: 10/13/22 11:44 AM   Specimen: Abscess  Result Value Ref Range Status   Specimen Description   Final    ABSCESS Performed at [facility], [address] [place], [place] [address]    Special Requests   Final    MUSCLE ABSCESS Performed at [facility], [address] [place], [place] [address]    Gram Stain   Final    ABUNDANT WBC PRESENT, PREDOMINANTLY PMN NO ORGANISMS SEEN Performed at [facility], [place] [address] [place], [place] [address]    Culture   Final    FEW STAPHYLOCOCCUS CAPITIS SUSCEPTIBILITIES TO FOLLOW NO ANAEROBES ISOLATED; CULTURE IN PROGRESS FOR 5 DAYS    Report Status PENDING  Incomplete    Coagulation Studies: No results for input(s): "LABPROT", "INR" in the last 72 hours.  Urinalysis: No results for input(s): "COLORURINE", "LABSPEC", "PHURINE", "GLUCOSEU", "HGBUR", "BILIRUBINUR", "KETONESUR", "PROTEINUR", "UROBILINOGEN", "NITRITE", "LEUKOCYTESUR" in the last 72 hours.  Invalid input(s): "APPERANCEUR"    Imaging: No results found.   Medications:    anticoagulant sodium citrate     ceFEPime (MAXIPIME) IV Stopped (10/15/22 1450)   vancomycin Stopped (10/14/22 1155)    (feeding supplement) PROSource Plus  30 mL Oral BID BM   atorvastatin  80 mg Oral QHS   Chlorhexidine Gluconate Cloth  6 each Topical Q0600   feeding supplement (NEPRO CARB STEADY)  237 mL Oral TID BM   gabapentin  100 mg Oral BID   heparin injection (subcutaneous)  5,000 Units Subcutaneous Q8H   insulin aspart  0-9 Units Subcutaneous TID WC   levETIRAcetam  1,000 mg Oral Daily   levETIRAcetam  250 mg Oral Q M,W,F   losartan  100 mg Oral Daily   metoprolol succinate  100 mg Oral Daily   multivitamin  1 tablet  Oral QHS   nystatin  5 mL Oral QID   pantoprazole  20 mg Oral BID   sertraline  25 mg Oral Daily   sodium chloride flush  10-40 mL Intracatheter Q12H   umeclidinium-vilanterol  1 puff Inhalation Daily   acetaminophen **OR** acetaminophen, alteplase, anticoagulant sodium citrate, heparin, hydrALAZINE, lidocaine (PF), lidocaine-prilocaine, morphine injection, pentafluoroprop-tetrafluoroeth, senna-docusate, sodium chloride flush, traMADol  Assessment/ Plan:  [name] is a 72 y.o.  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 She presents to the ED with worsening lower extremity pain. Patient will be admitted for Back pain [M54.9] Bilateral leg pain [M79.604, M79.605]  [facility] [place]/MWF/Lt AVG /102.5kg  End-stage renal disease on hemodialysis.  Will maintain outpatient schedule if possible.  Next treatment scheduled for Monday. Orders prepared. No indication for urgent HD today.   2. Anemia of chronic kidney disease Normocytic Lab Results  Component Value Date   HGB 9.5 (L) 10/16/2022  Patient receives Mircera at outpatient clinic.  Hemoglobin within optimal range.  3. Secondary Hyperparathyroidism: with outpatient labs: PTH 180 (3/11), phosphorus 5.4, calcium 9.3   Lab Results  Component Value Date   PTH 131 (H) 03/14/2018   CALCIUM 9.9 10/16/2022   CAION 1.09 (L) 08/28/2022   PHOS 5.4 (H) 09/09/2022   Bone minerals acceptable Will continue to monitor during this admission  4.  Hypertension with chronic kidney disease.  Home regimen includes losartan, metoprolol, and torsemide.  Prescribed losartan and metoprolol.   Blood pressure 113/42  5. Diabetes mellitus type II with chronic kidney disease/renal manifestations: insulin dependent. Home regimen includes Novolin 70/30. Most recent hemoglobin A1c is 5.8 on 03/23/22.   SSI  managed by primary team  6. Vertebral osteomyelitis, seen on CT lumber spine. Neurosurgery feels patient is not a surgical candidate Recommend antibiotics. ID following. CT guided aspiration of psoas abscess on 10/13/22.   Receiving Cefepime and vancomycin     LOS: 4 Branton Einstein P Avey Mcmanamon 3/24/202410:26 AM

## 2022-10-16 NOTE — Evaluation (Signed)
Physical Therapy Evaluation Patient Details Name: Cynthia Dean MRN: [id number] DOB: [birth year] Today's Date: 10/16/2022  History of Present Illness  Cynthia Dean is a 72 y.o. 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 She presents to the ED with worsening lower extremity pain.    Clinical Impression  Pt received in supine position and agreeable to therapy.  Pt without pillow and lying flat on her back.  Pt communicated effectively initially, but unable to tell therapist about prior living situation.  Family not in room either, as they had gone to church.  Pt needed minA for LE navigation when coming into sitting position.  Pt perseverated on "lower it down".  Unable to comprehend what that was until the very end of the session when therapist believes it was the bedrails that was causing her to scream about it.  Pt became agitated at the end of the session and threw herself back in bed.  Pt left with all needs met and in a much calmer manner than during the session.  Will re-assess when family members are present.      Recommendations for follow up therapy are one component of a multi-disciplinary discharge planning process, led by the attending physician.  Recommendations may be updated based on patient status, additional functional criteria and insurance authorization.  Follow Up Recommendations Skilled nursing-short term rehab (<3 hours/day) Can patient physically be transported by private vehicle: No    Assistance Recommended at Discharge Frequent or constant Supervision/Assistance  Patient can return home with the following  A lot of help with walking and/or transfers;A lot of help with bathing/dressing/bathroom;Assistance with cooking/housework;Assist for transportation;Help with stairs or ramp for entrance    Equipment Recommendations  (To  be determined at next venue of care.)  Recommendations for Other Services       Functional Status Assessment Patient has had a recent decline in their functional status and demonstrates the ability to make significant improvements in function in a reasonable and predictable amount of time.     Precautions / Restrictions Precautions Precautions: Fall Restrictions Weight Bearing Restrictions: No      Mobility  Bed Mobility Overal bed mobility: Needs Assistance Bed Mobility: Supine to Sit     Supine to sit: Min assist     General bed mobility comments: minA for LE navigation.    Transfers                   General transfer comment: Unable to perform because pt became very anxious and threw herself back in bed.    Ambulation/Gait               General Gait Details: deferred  Stairs            Wheelchair Mobility    Modified Rankin (Stroke Patients Only)       Balance Overall balance assessment: Mild deficits observed, not formally tested                                           Pertinent Vitals/Pain Pain Assessment Pain Assessment: No/denies pain    Home Living Family/patient expects to be discharged to:: Private residence Living Arrangements: Children;Other (Comment) (Friend) Available Help at Discharge: Family;Available PRN/intermittently Type of Home: House Home Access: Stairs to enter Entrance Stairs-Rails:  None Entrance Stairs-Number of Steps: 3   Home Layout: One level Home Equipment: Conservation officer, nature (2 wheels);Wheelchair - manual Additional Comments: all information in section pulled from previous visit as pt was unable to verbalize this information and family not in room upon arrival.    Prior Function Prior Level of Function : Needs assist       Physical Assist : ADLs (physical);Mobility (physical)   ADLs (physical): Grooming;Bathing;Dressing;Toileting Mobility Comments: Able to amb short household  distances with a RW at baseline but more recently she transferred from bed to wheelchair or bed to [facility] ADLs Comments: Husband reports pt's daughter was helping her with bathing and sorting/organizing meds, husband would help with dressing, meals, transfers and meds.  He reports at home she was feeding herself and here at the hospital he has been feeding her.  He reports they assist more than 50% during self care.     Hand Dominance   Dominant Hand: Right    Extremity/Trunk Assessment   Upper Extremity Assessment Upper Extremity Assessment: Generalized weakness    Lower Extremity Assessment Lower Extremity Assessment: Generalized weakness       Communication   Communication: Other (comment) (pt unable to verbally communicate prior living situation.)  Cognition Arousal/Alertness: Awake/alert Behavior During Therapy: Anxious, Agitated, Impulsive Overall Cognitive Status: No family/caregiver present to determine baseline cognitive functioning                                 General Comments: pt perseverating on "put it down" and becomes fearful and at times swatting at therapist when assisting with gown.        General Comments      Exercises     Assessment/Plan    PT Assessment Patient needs continued PT services  PT Problem List Decreased strength;Decreased activity tolerance;Decreased balance;Decreased mobility;Decreased cognition;Decreased knowledge of use of DME;Decreased safety awareness;Obesity       PT Treatment Interventions DME instruction;Gait training;Stair training;Functional mobility training;Therapeutic activities;Therapeutic exercise;Balance training;Neuromuscular re-education;Patient/family education    PT Goals (Current goals can be found in the Care Plan section)  Acute Rehab PT Goals PT Goal Formulation: Patient unable to participate in goal setting    Frequency Min 2X/week     Co-evaluation               AM-PAC PT "6 Clicks"  Mobility  Outcome Measure Help needed turning from your back to your side while in a flat bed without using bedrails?: A Little Help needed moving from lying on your back to sitting on the side of a flat bed without using bedrails?: A Little Help needed moving to and from a bed to a chair (including a wheelchair)?: A Lot Help needed standing up from a chair using your arms (e.g., wheelchair or bedside chair)?: Total Help needed to walk in hospital room?: Total Help needed climbing 3-5 steps with a railing? : Total 6 Click Score: 11    End of Session Equipment Utilized During Treatment: Gait belt Activity Tolerance:  (Treatment limited secondary to anxiety) Patient left: in bed;with call bell/phone within reach;with bed alarm set Nurse Communication: Mobility status PT Visit Diagnosis: Unsteadiness on feet (R26.81);Other abnormalities of gait and mobility (R26.89);Muscle weakness (generalized) (M62.81);Difficulty in walking, not elsewhere classified (R26.2)    Time: [id number] PT Time Calculation (min) (ACUTE ONLY): 16 min   Charges:   PT Evaluation $PT Eval Moderate Complexity: 1 Mod  Gwenlyn Saran, PT, DPT Physical Therapist - [facility]  10/16/22, 3:08 PM

## 2022-10-17 DIAGNOSIS — K6812 Psoas muscle abscess: Secondary | ICD-10-CM | POA: Diagnosis not present

## 2022-10-17 DIAGNOSIS — M5441 Lumbago with sciatica, right side: Secondary | ICD-10-CM | POA: Diagnosis not present

## 2022-10-17 DIAGNOSIS — D649 Anemia, unspecified: Secondary | ICD-10-CM

## 2022-10-17 DIAGNOSIS — M4620 Osteomyelitis of vertebra, site unspecified: Secondary | ICD-10-CM | POA: Diagnosis not present

## 2022-10-17 DIAGNOSIS — M5442 Lumbago with sciatica, left side: Secondary | ICD-10-CM | POA: Diagnosis not present

## 2022-10-17 DIAGNOSIS — M4646 Discitis, unspecified, lumbar region: Secondary | ICD-10-CM | POA: Diagnosis not present

## 2022-10-17 DIAGNOSIS — N186 End stage renal disease: Secondary | ICD-10-CM | POA: Diagnosis not present

## 2022-10-17 LAB — BASIC METABOLIC PANEL
Anion gap: 13 (ref 5–15)
BUN: 35 mg/dL — ABNORMAL HIGH (ref 8–23)
CO2: 27 mmol/L (ref 22–32)
Calcium: 10.2 mg/dL (ref 8.9–10.3)
Chloride: 100 mmol/L (ref 98–111)
Creatinine, Ser: 8.18 mg/dL — ABNORMAL HIGH (ref 0.44–1.00)
GFR, Estimated: 5 mL/min — ABNORMAL LOW (ref >60)
Glucose, Bld: 148 mg/dL — ABNORMAL HIGH (ref 70–99)
Potassium: 3.4 mmol/L — ABNORMAL LOW (ref 3.5–5.1)
Sodium: 140 mmol/L (ref 135–145)

## 2022-10-17 LAB — CBC
HCT: 32.2 % — ABNORMAL LOW (ref 36.0–46.0)
Hemoglobin: 9.3 g/dL — ABNORMAL LOW (ref 12.0–15.0)
MCH: 23.9 pg — ABNORMAL LOW (ref 26.0–34.0)
MCHC: 28.9 g/dL — ABNORMAL LOW (ref 30.0–36.0)
MCV: 82.8 fL (ref 80.0–100.0)
Platelets: 225 10*3/uL (ref 150–400)
RBC: 3.89 MIL/uL (ref 3.87–5.11)
RDW: 21.6 % — ABNORMAL HIGH (ref 11.5–15.5)
WBC: 8.9 10*3/uL (ref 4.0–10.5)
nRBC: 0 % (ref 0.0–0.2)

## 2022-10-17 LAB — CULTURE, BLOOD (ROUTINE X 2)
Culture: NO GROWTH
Culture: NO GROWTH

## 2022-10-17 LAB — GLUCOSE, CAPILLARY
Glucose-Capillary: 135 mg/dL — ABNORMAL HIGH (ref 70–99)
Glucose-Capillary: 140 mg/dL — ABNORMAL HIGH (ref 70–99)
Glucose-Capillary: 110 mg/dL — ABNORMAL HIGH (ref 70–99)
Glucose-Capillary: 119 mg/dL — ABNORMAL HIGH (ref 70–99)

## 2022-10-17 MED ORDER — LORAZEPAM 2 MG/ML PO CONC
0.5000 mg | Freq: Once | ORAL | Status: AC
  Administered 2022-10-17: 0.5 mg via ORAL
  Filled 2022-10-17: qty 1

## 2022-10-17 MED ORDER — SODIUM CHLORIDE 0.9 % IV SOLN
1.0000 g | INTRAVENOUS | Status: DC
  Administered 2022-10-17: 1 g via INTRAVENOUS
  Filled 2022-10-17: qty 1

## 2022-10-17 NOTE — Progress Notes (Signed)
[facility]  ROUNDING NOTE   Subjective:   Cynthia Dean is a 72 y.o. 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 She presents to the ED with worsening lower extremity pain. Patient will be admitted for Back pain [M54.9] Bilateral leg pain [M79.604, M79.605]  Patient is known to our practice and receives outpatient dialysis treatments at [facility] on a MWF schedule, supervised by Dr Candiss Norse.   Patient ill appearing Complains of pain in lower extremities Alert and oriented to self  HD RN reports patient arrived for scheduled dialysis treatment. Patient was confused and combative on arrival. Unable to follow commands. Patient returned to room and treatment aborted due to safety concerns.   Objective:  Vital signs in last 24 hours:  Temp:  [98.4 F (36.9 C)-99.4 F (37.4 C)] 98.4 F (36.9 C) (03/25 1150) Pulse Rate:  [88-94] 88 (03/25 1150) Resp:  [16-18] 16 (03/25 1150) BP: (113-117)/(39-64) 113/39 (03/25 1150) SpO2:  [90 %-100 %] 92 % (03/25 1150)  Weight change:  Filed Weights   10/12/22 1440 10/14/22 0759 10/14/22 1159  Weight: 94.4 kg 96.5 kg 95.4 kg    Intake/Output: I/O last 3 completed shifts: In: 100 [IV Piggyback:100] Out: -    Intake/Output this shift:  No intake/output data recorded.  Physical Exam: General: agitated  Head: Normocephalic, atraumatic. Moist oral mucosal membranes  Eyes: Anicteric  Lungs:  Clear to auscultation, normal effort  Heart: Regular rate and rhythm  Abdomen:  Soft, nontender  Extremities: Trace peripheral edema.  Neurologic: Alert and oriented to self, moving all four extremities  Skin: No lesions  Access: Left AVG    Basic Metabolic Panel: Recent Labs  Lab 10/12/22 0605 10/13/22 1146 10/14/22 0810 10/15/22 0615 10/16/22 0545  NA 138 135 137 134* 136  K 3.1* 3.2*  3.2* 3.0* 3.3*  CL 105 95* 99 99 103  CO2 24 27 26 27 27   GLUCOSE 109* 100* 91 179* 172*  BUN 24* 18 25* 18 26*  CREATININE 6.68* 5.09* 6.30* 4.83* 6.31*  CALCIUM 9.1 8.8* 9.2 9.3 9.9     Liver Function Tests: No results for input(s): "AST", "ALT", "ALKPHOS", "BILITOT", "PROT", "ALBUMIN" in the last 168 hours. No results for input(s): "LIPASE", "AMYLASE" in the last 168 hours. No results for input(s): "AMMONIA" in the last 168 hours.  CBC: Recent Labs  Lab 10/12/22 0605 10/13/22 1146 10/14/22 0810 10/15/22 0615 10/16/22 0545  WBC 10.2 7.2 7.7 8.8 9.4  NEUTROABS 7.6  --   --   --   --   HGB 9.6* 9.2* 9.4* 9.6* 9.5*  HCT 35.0* 31.3* 31.7* 32.9* 33.0*  MCV 86.0 82.4 82.8 81.6 82.5  PLT 199 221 224 226 200     Cardiac Enzymes: No results for input(s): "CKTOTAL", "CKMB", "CKMBINDEX", "TROPONINI" in the last 168 hours.  BNP: Invalid input(s): "POCBNP"  CBG: Recent Labs  Lab 10/16/22 1626 10/16/22 2001 10/16/22 2022 10/17/22 0935 10/17/22 1146  GLUCAP 125* 112* 118* 135* 140*     Microbiology: Results for orders placed or performed during the hospital encounter of 10/12/22  Blood culture (routine x 2)     Status: None   Collection Time: 10/12/22  5:40 PM   Specimen: BLOOD  Result Value Ref Range Status   Specimen Description BLOOD BLOOD RIGHT HAND [facility]  Final   Special Requests   Final    BOTTLES DRAWN AEROBIC  AND ANAEROBIC Blood Culture results may not be optimal due to an inadequate volume of blood received in culture bottles   Culture   Final    NO GROWTH 5 DAYS Performed at [facility], [address] [place], [place] [address]    Report Status 10/17/2022 FINAL  Final  Blood culture (routine x 2)     Status: None   Collection Time: 10/12/22  6:56 PM   Specimen: BLOOD  Result Value Ref Range Status   Specimen Description BLOOD BLOOD RIGHT FOREARM  Final   Special Requests   Final    BOTTLES DRAWN AEROBIC ONLY Blood Culture results may not  be optimal due to an inadequate volume of blood received in culture bottles   Culture   Final    NO GROWTH 5 DAYS Performed at [facility], [address] [place], [place] [address]    Report Status 10/17/2022 FINAL  Final  Aerobic/Anaerobic Culture w Gram Stain (surgical/deep wound)     Status: None (Preliminary result)   Collection Time: 10/13/22 11:44 AM   Specimen: Abscess  Result Value Ref Range Status   Specimen Description   Final    ABSCESS Performed at [facility], [address] [place], [place] [address]    Special Requests   Final    MUSCLE ABSCESS Performed at [facility], [address] [place], [place] [address]    Gram Stain   Final    ABUNDANT WBC PRESENT, PREDOMINANTLY PMN NO ORGANISMS SEEN Performed at [facility], [place] [address] [place], [place] [address]    Culture   Final    FEW STAPHYLOCOCCUS CAPITIS NO ANAEROBES ISOLATED; CULTURE IN PROGRESS FOR 5 DAYS    Report Status PENDING  Incomplete   Organism ID, Bacteria STAPHYLOCOCCUS CAPITIS  Final      Susceptibility   Staphylococcus capitis - MIC*    CIPROFLOXACIN <=0.5 SENSITIVE Sensitive     ERYTHROMYCIN >=8 RESISTANT Resistant     GENTAMICIN <=0.5 SENSITIVE Sensitive     OXACILLIN <=0.25 SENSITIVE Sensitive     TETRACYCLINE <=1 SENSITIVE Sensitive     VANCOMYCIN 1 SENSITIVE Sensitive     TRIMETH/SULFA <=10 SENSITIVE Sensitive     CLINDAMYCIN INTERMEDIATE Intermediate     RIFAMPIN <=0.5 SENSITIVE Sensitive     Inducible Clindamycin NEGATIVE Sensitive     * FEW STAPHYLOCOCCUS CAPITIS    Coagulation Studies: No results for input(s): "LABPROT", "INR" in the last 72 hours.  Urinalysis: No results for input(s): "COLORURINE", "LABSPEC", "PHURINE", "GLUCOSEU", "HGBUR", "BILIRUBINUR", "KETONESUR", "PROTEINUR", "UROBILINOGEN", "NITRITE", "LEUKOCYTESUR" in the last 72 hours.  Invalid input(s): "APPERANCEUR"    Imaging: No results found.   Medications:     anticoagulant sodium citrate     ceFEPime (MAXIPIME) IV     vancomycin Stopped (10/14/22 1155)    (feeding supplement) PROSource Plus  30 mL Oral BID BM   atorvastatin  80 mg Oral QHS   Chlorhexidine Gluconate Cloth  6 each Topical Q0600   feeding supplement (NEPRO CARB STEADY)  237 mL Oral TID BM   gabapentin  100 mg Oral BID   heparin injection (subcutaneous)  5,000 Units Subcutaneous Q8H   insulin aspart  0-9 Units Subcutaneous TID WC   levETIRAcetam  1,000 mg Oral Daily   levETIRAcetam  250 mg Oral Q M,W,F   losartan  100 mg Oral Daily   metoprolol succinate  100 mg Oral Daily   multivitamin  1 tablet Oral QHS  nystatin  5 mL Oral QID   pantoprazole  20 mg Oral BID   sertraline  25 mg Oral Daily   sodium chloride flush  10-40 mL Intracatheter Q12H   umeclidinium-vilanterol  1 puff Inhalation Daily   acetaminophen **OR** acetaminophen, alteplase, anticoagulant sodium citrate, heparin, hydrALAZINE, lidocaine (PF), lidocaine-prilocaine, morphine injection, pentafluoroprop-tetrafluoroeth, senna-docusate, sodium chloride flush, traMADol  Assessment/ Plan:  Ms. Salama Stuver is a 72 y.o.  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 She presents to the ED with worsening lower extremity pain. Patient will be admitted for Back pain [M54.9] Bilateral leg pain [M79.604, M79.605]  [facility] [place]/MWF/Lt AVG /102.5kg  End-stage renal disease on hemodialysis.  Will maintain outpatient schedule if possible.  Attempted dialysis this morning however patient behavior deemed unsafe and treatment aborted. Will attempt treatment again this afternoon. Due to history of dementia, would recommend palliative care consult to establish goals of care.   2. Anemia of chronic kidney disease Normocytic Lab Results  Component Value Date   HGB 9.5 (L) 10/16/2022   Patient receives Mircera at outpatient clinic.  Hemoglobin at goal  3. Secondary Hyperparathyroidism: with outpatient labs: PTH 180 (3/11), phosphorus 5.4, calcium 9.3   Lab Results  Component Value Date   PTH 131 (H) 03/14/2018   CALCIUM 9.9 10/16/2022   CAION 1.09 (L) 08/28/2022   PHOS 5.4 (H) 09/09/2022  Calcium and phosphorus with desired range.  4.  Hypertension with chronic kidney disease.  Home regimen includes losartan, metoprolol, and torsemide.  Prescribed losartan and metoprolol.   Blood pressure remained stable for this patient.  5. Diabetes mellitus type II with chronic kidney disease/renal manifestations: insulin dependent. Home regimen includes Novolin 70/30. Most recent hemoglobin A1c is 5.8 on 03/23/22.   Primary team to manage sliding scale insulin.  6. Vertebral osteomyelitis, seen on CT lumber spine. Neurosurgery feels patient is not a surgical candidate Recommend antibiotics. ID following. CT guided aspiration of psoas abscess on 10/13/22.   Receiving daily cefepime and vancomycin with dialysis treatments    LOS: 5   3/25/20241:01 PM

## 2022-10-17 NOTE — Progress Notes (Signed)
Patient refused all her night/morning meds and vitals this morning. She was very combative last night trying to kick and punch nursing staff. All attempts to calm her down resulted in verbal  abuse

## 2022-10-17 NOTE — Progress Notes (Signed)
PROGRESS NOTE    Cynthia Dean  [id number] DOB: May 05, 1951 DOA: 10/12/2022 PCP: Lowella Bandy, MD    Assessment & Plan:   Principal Problem:   Back pain Active Problems:   Anemia   ESRD on dialysis [facility])   Essential hypertension   Dyslipidemia   Seizure disorder ([place])   Diabetic retinopathy without macular edema associated with type 2 diabetes mellitus (HCC)   GERD (gastroesophageal reflux disease)   Diabetic neuropathy (HCC)   Anemia of chronic disease   Insulin-requiring or dependent type II diabetes mellitus ([place])   Discitis   Myositis  Assessment and Plan:  Possible Discitis/osteomyelitis at L4-L5: w/ suspected intradiscal abscess as per MRI. S/p CT aspiration of right psoas muscle abscess yielding 81mL of purulent material. Cx growing staph capitis. Continue on IV cefepime, vanco as per ID. 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 Continue on IV vanco, cefepime as per ID. Cx growing staph capitis. ID following and recs apprec    ESRD: on HD MWF. Pt refused HD today & pt is confused. Nephro recs apprec   Generalized weakness: PT recs SNF   ACD: likely secondary ESRD. H&H are stable   Hypokalemia: management w/ HD    Hx of seizures: continue on home dose of keppra  HLD: continue on statin   Hx of COPD: w/o exacerbation. Continue on bronchodilators    DM2: well controlled, HbA1c 5.2. Continue on SSI w/ accuchecks    Peripheral neuropathy: likely secondary to DM2. Continue on home dose of gabapentin    HTN: continue on metoprolol     GERD: continue on PPI   Depression: severity unknown. Continue on home dose of sertraline    Overweight: BMI 28.2. Would benefit from weight loss         DVT prophylaxis: heparin SQ Code Status: full  Family Communication:  Disposition Plan: PT recs SNF  Level of care: Telemetry Medical Status is:  Inpatient Remains inpatient appropriate because: severity of illness    Consultants:  ID IR Neuro surg   Procedures:   Antimicrobials:   Subjective: Pt c/o back pain   Objective: Vitals:   10/16/22 0810 10/16/22 1030 10/16/22 1633 10/16/22 2017  BP: (!) 113/42 (!) 141/68 113/64 (!) 117/55  Pulse: 85 90 94 90  Resp: 20  16 18   Temp: 98.6 F (37 C)  99.4 F (37.4 C) 98.8 F (37.1 C)  TempSrc:    Oral  SpO2: 96%  90% 100%  Weight:      Height:       No intake or output data in the 24 hours ending 10/17/22 0738  Filed Weights   10/12/22 1440 10/14/22 0759 10/14/22 1159  Weight: 94.4 kg 96.5 kg 95.4 kg    Examination:  General exam: Appears calm but confused  Respiratory system: diminished breath sounds b/l  Cardiovascular system: S1 & S2+. No rubs or clicks  Gastrointestinal system: Abd is soft, NT, obese & hypoactive bowel sounds   Central nervous system: Awake but confused. Moves all extremities  Psychiatry: judgement and insight appears poor. Flat mood and affect    Data Reviewed: I have personally reviewed following labs and imaging studies  CBC: Recent Labs  Lab 10/12/22 0605 10/13/22 1146 10/14/22 0810 10/15/22 0615 10/16/22 0545  WBC 10.2 7.2 7.7 8.8 9.4  NEUTROABS 7.6  --   --   --   --  HGB 9.6* 9.2* 9.4* 9.6* 9.5*  HCT 35.0* 31.3* 31.7* 32.9* 33.0*  MCV 86.0 82.4 82.8 81.6 82.5  PLT 199 221 224 226 [id number]   Basic Metabolic Panel: Recent Labs  Lab 10/12/22 0605 10/13/22 1146 10/14/22 0810 10/15/22 0615 10/16/22 0545  NA 138 135 137 134* 136  K 3.1* 3.2* 3.2* 3.0* 3.3*  CL 105 95* 99 99 103  CO2 24 27 26 27 27   GLUCOSE 109* 100* 91 179* 172*  BUN 24* 18 25* 18 26*  CREATININE 6.68* 5.09* 6.30* 4.83* 6.31*  CALCIUM 9.1 8.8* 9.2 9.3 9.9   GFR: Estimated Creatinine Clearance: 10.6 mL/min (A) (by C-G formula based on SCr of 6.31 mg/dL (H)). Liver Function Tests: No results for input(s): "AST", "ALT", "ALKPHOS", "BILITOT", "PROT",  "ALBUMIN" in the last 168 hours. No results for input(s): "LIPASE", "AMYLASE" in the last 168 hours. No results for input(s): "AMMONIA" in the last 168 hours. Coagulation Profile: No results for input(s): "INR", "PROTIME" in the last 168 hours. Cardiac Enzymes: No results for input(s): "CKTOTAL", "CKMB", "CKMBINDEX", "TROPONINI" in the last 168 hours. BNP (last 3 results) No results for input(s): "PROBNP" in the last 8760 hours. HbA1C: No results for input(s): "HGBA1C" in the last 72 hours.  CBG: Recent Labs  Lab 10/16/22 0808 10/16/22 1142 10/16/22 1626 10/16/22 2001 10/16/22 2022  GLUCAP 157* 121* 125* 112* 118*   Lipid Profile: No results for input(s): "CHOL", "HDL", "LDLCALC", "TRIG", "CHOLHDL", "LDLDIRECT" in the last 72 hours. Thyroid Function Tests: No results for input(s): "TSH", "T4TOTAL", "FREET4", "T3FREE", "THYROIDAB" in the last 72 hours. Anemia Panel: No results for input(s): "VITAMINB12", "FOLATE", "FERRITIN", "TIBC", "IRON", "RETICCTPCT" in the last 72 hours. Sepsis Labs: Recent Labs  Lab 10/12/22 1740 10/13/22 0006  LATICACIDVEN 1.5 0.8    Recent Results (from the past 240 hour(s))  Blood culture (routine x 2)     Status: None   Collection Time: 10/12/22  5:40 PM   Specimen: BLOOD  Result Value Ref Range Status   Specimen Description BLOOD BLOOD RIGHT HAND [facility]  Final   Special Requests   Final    BOTTLES DRAWN AEROBIC AND ANAEROBIC Blood Culture results may not be optimal due to an inadequate volume of blood received in culture bottles   Culture   Final    NO GROWTH 5 DAYS Performed at [facility], [address] [place], [place] [address]    Report Status 10/17/2022 FINAL  Final  Blood culture (routine x 2)     Status: None   Collection Time: 10/12/22  6:56 PM   Specimen: BLOOD  Result Value Ref Range Status   Specimen Description BLOOD BLOOD RIGHT FOREARM  Final   Special Requests   Final    BOTTLES DRAWN AEROBIC ONLY Blood Culture  results may not be optimal due to an inadequate volume of blood received in culture bottles   Culture   Final    NO GROWTH 5 DAYS Performed at [facility], [address], [place], [place] [address]    Report Status 10/17/2022 FINAL  Final  Aerobic/Anaerobic Culture w Gram Stain (surgical/deep wound)     Status: None (Preliminary result)   Collection Time: 10/13/22 11:44 AM   Specimen: Abscess  Result Value Ref Range Status   Specimen Description   Final    ABSCESS Performed at [facility], [address], [place],  [address]    Special Requests   Final    MUSCLE ABSCESS Performed at  [place], [place] [address]    Gram Stain   Final    ABUNDANT WBC PRESENT, PREDOMINANTLY PMN NO ORGANISMS SEEN Performed at [facility], [place] [address], [place], [place] [address]    Culture   Final    FEW STAPHYLOCOCCUS CAPITIS NO ANAEROBES ISOLATED; CULTURE IN PROGRESS FOR 5 DAYS    Report Status PENDING  Incomplete   Organism ID, Bacteria STAPHYLOCOCCUS CAPITIS  Final      Susceptibility   Staphylococcus capitis - MIC*    CIPROFLOXACIN <=0.5 SENSITIVE Sensitive     ERYTHROMYCIN >=8 RESISTANT Resistant     GENTAMICIN <=0.5 SENSITIVE Sensitive     OXACILLIN <=0.25 SENSITIVE Sensitive     TETRACYCLINE <=1 SENSITIVE Sensitive     VANCOMYCIN 1 SENSITIVE Sensitive     TRIMETH/SULFA <=10 SENSITIVE Sensitive     CLINDAMYCIN INTERMEDIATE Intermediate     RIFAMPIN <=0.5 SENSITIVE Sensitive     Inducible Clindamycin NEGATIVE Sensitive     * FEW STAPHYLOCOCCUS CAPITIS         Radiology Studies: No results found.      Scheduled Meds:  (feeding supplement) PROSource Plus  30 mL Oral BID BM   atorvastatin  80 mg Oral QHS   Chlorhexidine Gluconate Cloth  6 each Topical Q0600   feeding supplement (NEPRO CARB STEADY)  237 mL Oral TID BM   gabapentin  100 mg Oral BID   heparin injection (subcutaneous)  5,000 Units  Subcutaneous Q8H   insulin aspart  0-9 Units Subcutaneous TID WC   levETIRAcetam  1,000 mg Oral Daily   levETIRAcetam  250 mg Oral Q M,W,F   losartan  100 mg Oral Daily   metoprolol succinate  100 mg Oral Daily   multivitamin  1 tablet Oral QHS   nystatin  5 mL Oral QID   pantoprazole  20 mg Oral BID   sertraline  25 mg Oral Daily   sodium chloride flush  10-40 mL Intracatheter Q12H   umeclidinium-vilanterol  1 puff Inhalation Daily   Continuous Infusions:  anticoagulant sodium citrate     ceFEPime (MAXIPIME) IV 1 g (10/16/22 1353)   vancomycin Stopped (10/14/22 1155)     LOS: 5 days    Time spent: 25 mins     Wyvonnia Dusky, MD [facility] Pager [phone]  If 7PM-7AM, please contact night-coverage [email or web address] 10/17/2022, 7:38 AM

## 2022-10-17 NOTE — Progress Notes (Signed)
Pt refuses to turn for posterior assessment. Pt becomes combative hitting at nurse and yelling "no, leave me alone" when attempting to move/turn pt in bed.

## 2022-10-17 NOTE — Consult Note (Signed)
Pharmacy Antibiotic Note  Cynthia Dean is a 72 y.o. female admitted on 10/12/2022 with  discitis and possible osteomyelitis .  Pharmacy has been consulted for Vancomycin and Cefepime dosing. Of note, patient receives hemodialysis MWF outpatient. Plan is to continue regimen while inpatient.  *Infectious diease as been consulted. Patient remains on Vancomycin and cefepime. Noted patient is combative today (3/35), hitting nurse, and refusing meds. No HD or Vancomycin IV given*  Plan Continue Vancomycin 1g IV qHD session(currently set as MWF). Level ordered for this AM but unable to obtained. Will order for tomorrow AM instead.  2. Continue Cefepime 1g Q24 hours  Doses ordered per [place] Antibiotic Protocol for hemodialysis  Height: 6' (182.9 cm) Weight: 95.4 kg (210 lb 5.1 oz) IBW/kg (Calculated) : 73.1  Temp (24hrs), Avg:99.1 F (37.3 C), Min:98.8 F (37.1 C), Max:99.4 F (37.4 C)  Recent Labs  Lab 10/12/22 0605 10/12/22 1740 10/13/22 0006 10/13/22 1146 10/14/22 0810 10/15/22 0615 10/16/22 0545  WBC 10.2  --   --  7.2 7.7 8.8 9.4  CREATININE 6.68*  --   --  5.09* 6.30* 4.83* 6.31*  LATICACIDVEN  --  1.5 0.8  --   --   --   --      Estimated Creatinine Clearance: 10.6 mL/min (A) (by C-G formula based on SCr of 6.31 mg/dL (H)).    Allergies  Allergen Reactions   Oxycodone Nausea And Vomiting   Oxycodone-Acetaminophen Other (See Comments)   Wound Dressing Adhesive    Hydrocodone     Intolerant more than allergic   Tape Itching    Skin Dermatitis/itching (tape adhesive) Skin Dermatitis/itching (tape adhesive)   Tapentadol Itching    Skin Dermatitis/itching (tape adhesive) Skin Dermatitis/itching (tape adhesive)     Antimicrobials this admission: Vancomycin 3/21 >>  Cefepime 3/21 >>   Dose adjustments this admission: ESRD  Microbiology results: 3/20 BCx: NGTD 3/21 Abscess cx: Staph capitis 3/21 MRSA PCR: pending  Thank you for allowing pharmacy  to be a part of this patient's care. Donnita Farina Rodriguez-Guzman PharmD, BCPS 10/17/2022 10:30 AM

## 2022-10-17 NOTE — Progress Notes (Addendum)
Received patient on bed from her room. Patient is fully awake but is confused and is combative. HD Rns tried to redirect and and reorient. Explained to her what was going on but she keeps on repeating "Can I see?". Attempted to cannulate patient but she keeps on moving. Blood glucose: 135. Dr. Juleen China notified and asked to transfer patient back to her room.   Will re attempt HD again this afternoon.

## 2022-10-17 NOTE — Progress Notes (Addendum)
Date of Admission:  10/12/2022      ID: Cynthia Dean is a 72 y.o. female  Principal Problem:   Back pain Active Problems:   Essential hypertension   Seizure disorder ([place])   Diabetic retinopathy without macular edema associated with type 2 diabetes mellitus (HCC)   GERD (gastroesophageal reflux disease)   Dyslipidemia   Diabetic neuropathy (HCC)   Anemia of chronic disease   ESRD on dialysis ([place])   Anemia   Insulin-requiring or dependent type II diabetes mellitus ([place])   Discitis   Myositis Medical history 03/04/22 Ca breast lumpectomy, TAD 8/26-9/8 Hospitalization for fatigue weakness- diagnosed with pulmonary embolism, Staph capitis bacteremia and left breast fluid collection which initially  was thought to be an abscess but on aspiration was old blood and likely a seroma with negative fluid culture She was treated with 2 weeks of appropriate Iv antibiotic for the staph capitis until 04/03/22.  She had been to the ED and admitted many times after that Oct 2023- Back pain and rt sciatica- MRI lumbar spine done on 05/11/22 Degenerative disc disease.  Pt did not want to pursue chemo or radiation for the breast cancer ( ER-, PR 20%, HER 2 positive)  She was hospitalized in Feb 2024 for severe anemia -Hb 5.4- GI work up negative except for diverticulosis ( not bleeding on the day of the scope)  Back again with back pain and b/l radiation of pain down the leg  MRI this time showed b/l lumbar abscess, L4-L5 discitis, b/l psoas abscess  Aspiration of the rt psoas abscess on 10/13/22 is positive for staph capitis   Partner at bed side Subjective: Pt appears to be confused, some agitation Saying the same thing  They could not do dialysis because of agitation  Medications:   (feeding supplement) PROSource Plus  30 mL Oral BID BM   atorvastatin  80 mg Oral QHS   Chlorhexidine Gluconate Cloth  6 each Topical Q0600   feeding supplement (NEPRO CARB STEADY)  237 mL Oral  TID BM   gabapentin  100 mg Oral BID   heparin injection (subcutaneous)  5,000 Units Subcutaneous Q8H   insulin aspart  0-9 Units Subcutaneous TID WC   levETIRAcetam  1,000 mg Oral Daily   levETIRAcetam  250 mg Oral Q M,W,F   losartan  100 mg Oral Daily   metoprolol succinate  100 mg Oral Daily   multivitamin  1 tablet Oral QHS   nystatin  5 mL Oral QID   pantoprazole  20 mg Oral BID   sertraline  25 mg Oral Daily   sodium chloride flush  10-40 mL Intracatheter Q12H   umeclidinium-vilanterol  1 puff Inhalation Daily    Objective: Vital signs in last 24 hours: Patient Vitals for the past 24 hrs:  BP Temp Temp src Pulse Resp SpO2  10/17/22 1150 (!) 113/39 98.4 F (36.9 C) -- 88 16 92 %  10/16/22 2017 (!) 117/55 98.8 F (37.1 C) Oral 90 18 100 %  10/16/22 1633 113/64 99.4 F (37.4 C) -- 94 16 90 %       PHYSICAL EXAM:  General: awake, some confusion- saying no--no  Grabbing my arm repeatedly. Her partner is able to get her focused for a minute or so Lungs: b/l air entry. Heart: s1s2 Abdomen: Soft, non-tender,not distended. Bowel sounds normal. No masses Extremities: atraumatic, no cyanosis. No edema. No clubbing Skin: No rashes or lesions. Or bruising Left AVG CNS rt lower extremity movement  less compared to the rest  Lab Results    Latest Ref Rng & Units 10/16/2022    5:45 AM 10/15/2022    6:15 AM 10/14/2022    8:10 AM  CBC  WBC 4.0 - 10.5 K/uL 9.4  8.8  7.7   Hemoglobin 12.0 - 15.0 g/dL 9.5  9.6  9.4   Hematocrit 36.0 - 46.0 % 33.0  32.9  31.7   Platelets 150 - 400 K/uL 200  226  224        Latest Ref Rng & Units 10/16/2022    5:45 AM 10/15/2022    6:15 AM 10/14/2022    8:10 AM  CMP  Glucose 70 - 99 mg/dL 172  179  91   BUN 8 - 23 mg/dL 26  18  25    Creatinine 0.44 - 1.00 mg/dL 6.31  4.83  6.30   Sodium 135 - 145 mmol/L 136  134  137   Potassium 3.5 - 5.1 mmol/L 3.3  3.0  3.2   Chloride 98 - 111 mmol/L 103  99  99   CO2 22 - 32 mmol/L 27  27  26    Calcium  8.9 - 10.3 mg/dL 9.9  9.3  9.2     Erythrocyte Sedimentation Rate     Component Value Date/Time   ESRSEDRATE 70 (H) 10/13/2022 0006      Microbiology: 10/12/22 BC- NG so far 3/21 psoas abscess   Studies/Results: No results found.   Assessment/Plan:  Lumbar discitis,L4-L5  epidural phlegmon and b/l psoas abscess on MRI Underwent IR aspiration of psoas abscess on 10/13/22 Vanco and cefepime stated on 10/13/22 after procedure Gram stain numerous wbc no organism Culture positive for staph capitis a coag neg bacteria Blood culture ng On 03/19/22 she had 4/4 blood culture positive for staph capitis At that time she had left breast swelling , induration initially thought to be an abscess post lumpectomy on 03/04/22 for malignancy. US done showed complicated cystic lesion measuring 14.5 x 6.3 x 9.2 cm. Left breast. IR aspirated 135 ml of old blood from the left breast and culture was negative She was then treated with a total of 2 weeks of appropriate antibiotic for the pan sensitive staph capitis ( Iv ceftriaxone, vanco and then cefazolin) The 2 staph capitis have slightly different susceptibility pattern- the one in Aug from blood was pan sensitive and the one from the abscess has resistance to clinda and erythromycin  Is the lumbar infection indolent from Aug bacteremia, or is it from a new infection because she is at risk for one due to ESRD, being on dialysis and having DDD Or did she have endocarditis and seeded the spine. Last echo from 03/22/22 did not show any valve vegetations. Blood culture this time negative Will repeat 2 d echo   Will now need 6 weeks- 8 weeks of IV antibiotic On vancomycin and cefepime Dc cefepime Will change vanco to cefazolin  Confusion/agitation-  Could be medication- Opioids/cefepime Today she missed dialysis and watch closely for worsening of symptoms Speech echolalia/slurred May consider CT imaging if no improvement    ESRD on dialysis Recent  malfunctioning AVG and underwent fistulogram, angioplasty and stent placement on 09/22/22 Recommend tagged WBC     Left breast Carcinoma- s/p  lumpectomy and targeted axillary dissection She did not want to take chemo/radiation    Anemia   DM -    HTN on metoprolol  H/o Pulmonary embolism H/o CVA  Discussed the management with  her partner

## 2022-10-17 NOTE — Care Management Important Message (Signed)
Important Message  Patient Details  Name: Cynthia Dean MRN: [id number] Date of Birth: 1951-03-04   Medicare Important Message Given:  Yes     [name] 10/17/2022, 11:32 AM

## 2022-10-17 NOTE — Evaluation (Signed)
Occupational Therapy Evaluation Patient Details Name: Cynthia Dean MRN: [id number] DOB: June 10, 1951 Today's Date: 10/17/2022   History of Present Illness Cynthia Dean is a 72 y.o. 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 She presents to the ED with worsening lower extremity pain.   Clinical Impression   Cynthia Dean was seen for OT evaluation this date. Pt is poor historian, increasing agitation t/o session, unable to answer home setup questions. Pt currently requires SETUP for self-feeding at bed level, decreasing to MAX A as pt agitation increases. Pt initiating frequent movement of LLE at bed in chair position, increased agitation with OOB mobility attempts. Repeatedly stating "no this" and pointing to various objects, pt repeatedly denies pain. Pt would benefit from trial of OT while in hospital. Upon hospital discharge, recommend +2 assistance and follow up therapy.    Recommendations for follow up therapy are one component of a multi-disciplinary discharge planning process, led by the attending physician.  Recommendations may be updated based on patient status, additional functional criteria and insurance authorization.   Assistance Recommended at Discharge Frequent or constant Supervision/Assistance  Patient can return home with the following Two people to help with walking and/or transfers;Two people to help with bathing/dressing/bathroom;Help with stairs or ramp for entrance    Functional Status Assessment  Patient has had a recent decline in their functional status and demonstrates the ability to make significant improvements in function in a reasonable and predictable amount of time.  Equipment Recommendations  None recommended by OT    Recommendations for Other Services       Precautions / Restrictions Precautions Precautions:  Fall Restrictions Weight Bearing Restrictions: No      Mobility Bed Mobility Overal bed mobility: Needs Assistance             General bed mobility comments: MAX A sup<>long sit    Transfers                   General transfer comment: limited by pt agitation and pt leaving for HD treatment          ADL either performed or assessed with clinical judgement   ADL Overall ADL's : Needs assistance/impaired                                       General ADL Comments: SETUP self-feeding at bed level, decreasing to MAX A as pt agitation increases. MOD A for LB access at bed level.      Pertinent Vitals/Pain Pain Assessment Pain Assessment: No/denies pain (moaning/yelling intermittenly however when asked about pain repeatedly states "no")     Hand Dominance     Extremity/Trunk Assessment Upper Extremity Assessment Upper Extremity Assessment: Generalized weakness   Lower Extremity Assessment Lower Extremity Assessment: Generalized weakness       Communication Communication Communication: Other (comment) (repeating phrases, moaning t/o session)   Cognition Arousal/Alertness: Awake/alert Behavior During Therapy: Anxious, Agitated, Restless Overall Cognitive Status: No family/caregiver present to determine baseline cognitive functioning                                                  Home Living Family/patient expects to  be discharged to:: Private residence Living Arrangements: Children Available Help at Discharge: Family;Available PRN/intermittently Type of Home: House Home Access: Stairs to enter [employer] of Steps: 3 Entrance Stairs-Rails: None Home Layout: One level     Bathroom Shower/Tub: Tub/shower unit;Curtain   Biochemist, clinical: Standard     Home Equipment: Conservation officer, nature (2 wheels);Wheelchair - manual   Additional Comments: per chart      Prior Functioning/Environment Prior Level of  Function : Patient poor historian/Family not available               ADLs Comments: per chart pt previously required assist for all ADLs        OT Problem List: Decreased strength;Decreased activity tolerance;Decreased range of motion;Impaired balance (sitting and/or standing);Decreased safety awareness;Decreased cognition      OT Treatment/Interventions: Therapeutic exercise;Self-care/ADL training;Energy conservation;DME and/or AE instruction;Therapeutic activities;Balance training;Patient/family education    OT Goals(Current goals can be found in the care plan section) Acute Rehab OT Goals Patient Stated Goal: to lie down OT Goal Formulation: Patient unable to participate in goal setting Time For Goal Achievement: 10/31/22 Potential to Achieve Goals: Fair ADL Goals Pt Will Perform Grooming: with supervision;bed level;with set-up Pt Will Perform Lower Body Dressing: with mod assist;bed level Pt Will Transfer to Toilet: with min assist (rolling bed level)  OT Frequency: Min 1X/week    Co-evaluation              AM-PAC OT "6 Clicks" Daily Activity     Outcome Measure Help from another person eating meals?: A Little Help from another person taking care of personal grooming?: A Lot Help from another person toileting, which includes using toliet, bedpan, or urinal?: A Lot Help from another person bathing (including washing, rinsing, drying)?: A Lot Help from another person to put on and taking off regular upper body clothing?: A Lot Help from another person to put on and taking off regular lower body clothing?: A Lot 6 Click Score: 13   End of Session Nurse Communication: Mobility status  Activity Tolerance: Treatment limited secondary to agitation Patient left: in bed;with call bell/phone within reach;with bed alarm set  OT Visit Diagnosis: Other abnormalities of gait and mobility (R26.89);Muscle weakness (generalized) (M62.81)                Time: [id number] OT Time  Calculation (min): 17 min Charges:  OT General Charges $OT Visit: 1 Visit OT Evaluation $OT Eval Moderate Complexity: 1 Mod  Cynthia Dean, M.S. OTR/L  10/17/22, 2:54 PM  ascom [phone]

## 2022-10-17 NOTE — Progress Notes (Addendum)
1430: Received patient alert on bed from her room. Patient is disoriented x 4 and is combative when placed on monitor. Tried to calm her down for a few mins. Attempted cannulation for HD but patient became combative again. Notified Dr. Juleen China and he ordered Ativan.    1450: Attempted cannulation again for HD. Patient woke up and started to pull out pulse ox and monitor. Dr. Juleen China is present and is aware. He ordered to bring the patient back to her room. Vital signs are stable. Hand off given to floor RN.

## 2022-10-18 DIAGNOSIS — Z992 Dependence on renal dialysis: Secondary | ICD-10-CM | POA: Diagnosis not present

## 2022-10-18 DIAGNOSIS — M5442 Lumbago with sciatica, left side: Secondary | ICD-10-CM | POA: Diagnosis not present

## 2022-10-18 DIAGNOSIS — K6812 Psoas muscle abscess: Secondary | ICD-10-CM | POA: Diagnosis not present

## 2022-10-18 DIAGNOSIS — N186 End stage renal disease: Secondary | ICD-10-CM | POA: Diagnosis not present

## 2022-10-18 DIAGNOSIS — M4646 Discitis, unspecified, lumbar region: Secondary | ICD-10-CM | POA: Diagnosis not present

## 2022-10-18 DIAGNOSIS — M5441 Lumbago with sciatica, right side: Secondary | ICD-10-CM | POA: Diagnosis not present

## 2022-10-18 DIAGNOSIS — M4620 Osteomyelitis of vertebra, site unspecified: Secondary | ICD-10-CM | POA: Diagnosis not present

## 2022-10-18 LAB — CBC
HCT: 33.9 % — ABNORMAL LOW (ref 36.0–46.0)
Hemoglobin: 9.7 g/dL — ABNORMAL LOW (ref 12.0–15.0)
MCH: 23.7 pg — ABNORMAL LOW (ref 26.0–34.0)
MCHC: 28.6 g/dL — ABNORMAL LOW (ref 30.0–36.0)
MCV: 82.7 fL (ref 80.0–100.0)
Platelets: 215 10*3/uL (ref 150–400)
RBC: 4.1 MIL/uL (ref 3.87–5.11)
RDW: 21.3 % — ABNORMAL HIGH (ref 11.5–15.5)
WBC: 8.1 10*3/uL (ref 4.0–10.5)
nRBC: 0 % (ref 0.0–0.2)

## 2022-10-18 LAB — BASIC METABOLIC PANEL
Anion gap: 12 (ref 5–15)
BUN: 39 mg/dL — ABNORMAL HIGH (ref 8–23)
CO2: 25 mmol/L (ref 22–32)
Calcium: 9.9 mg/dL (ref 8.9–10.3)
Chloride: 103 mmol/L (ref 98–111)
Creatinine, Ser: 9.48 mg/dL — ABNORMAL HIGH (ref 0.44–1.00)
GFR, Estimated: 4 mL/min — ABNORMAL LOW (ref >60)
Glucose, Bld: 124 mg/dL — ABNORMAL HIGH (ref 70–99)
Potassium: 3.4 mmol/L — ABNORMAL LOW (ref 3.5–5.1)
Sodium: 140 mmol/L (ref 135–145)

## 2022-10-18 LAB — AEROBIC/ANAEROBIC CULTURE W GRAM STAIN (SURGICAL/DEEP WOUND)

## 2022-10-18 LAB — GLUCOSE, CAPILLARY
Glucose-Capillary: 128 mg/dL — ABNORMAL HIGH (ref 70–99)
Glucose-Capillary: 115 mg/dL — ABNORMAL HIGH (ref 70–99)
Glucose-Capillary: 126 mg/dL — ABNORMAL HIGH (ref 70–99)
Glucose-Capillary: 119 mg/dL — ABNORMAL HIGH (ref 70–99)

## 2022-10-18 MED ORDER — CEFAZOLIN SODIUM-DEXTROSE 1-4 GM/50ML-% IV SOLN
1.0000 g | INTRAVENOUS | Status: DC
  Administered 2022-10-20 – 2022-10-27 (×9): 1 g via INTRAVENOUS
  Filled 2022-10-18 (×10): qty 50

## 2022-10-18 MED ORDER — HALOPERIDOL LACTATE 5 MG/ML IJ SOLN
5.0000 mg | Freq: Once | INTRAMUSCULAR | Status: AC
  Administered 2022-10-18: 5 mg via INTRAMUSCULAR
  Filled 2022-10-18: qty 1

## 2022-10-18 MED ORDER — CEFAZOLIN SODIUM-DEXTROSE 2-4 GM/100ML-% IV SOLN
2.0000 g | Freq: Once | INTRAVENOUS | Status: AC
  Administered 2022-10-18: 2 g via INTRAVENOUS
  Filled 2022-10-18: qty 100

## 2022-10-18 NOTE — Progress Notes (Signed)
Date of Admission:  10/12/2022      ID: Cynthia Dean is a 72 y.o. 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  She had been to the ED and admitted many times after that Oct 2023- Back pain and rt sciatica- MRI lumbar spine done on 05/11/22 Degenerative disc disease.  Pt did not want to pursue chemo or radiation for the breast cancer ( ER-, PR 20%, HER 2 positive)  She was hospitalized in Feb 2024 for severe anemia -Hb 5.4- GI work up negative except for diverticulosis ( not bleeding on the day of the scope)  Back again with back pain and b/l radiation of pain down the leg  MRI this time showed b/l lumbar abscess, L4-L5 discitis, b/l psoas abscess  Aspiration of the rt psoas abscess on 10/13/22 is positive for staph capitis   Partner at bed side Subjective: Agitated and confused Refused dialysis  Medications:   (feeding supplement) PROSource Plus  30 mL Oral BID BM   atorvastatin  80 mg Oral QHS   Chlorhexidine Gluconate Cloth  6 each Topical Q0600   feeding supplement (NEPRO CARB STEADY)  237 mL Oral TID BM   gabapentin  100 mg Oral BID   heparin  injection (subcutaneous)  5,000 Units Subcutaneous Q8H   insulin aspart  0-9 Units Subcutaneous TID WC   levETIRAcetam  1,000 mg Oral Daily   levETIRAcetam  250 mg Oral Q M,W,F   losartan  100 mg Oral Daily   metoprolol succinate  100 mg Oral Daily   multivitamin  1 tablet Oral QHS   nystatin  5 mL Oral QID   pantoprazole  20 mg Oral BID   sertraline  25 mg Oral Daily   sodium chloride flush  10-40 mL Intracatheter Q12H   umeclidinium-vilanterol  1 puff Inhalation Daily    Objective: Vital signs in last 24 hours: Patient Vitals for the past 24 hrs:  BP Temp Pulse Resp SpO2  10/18/22 0829 (!) 123/52 98.2 F (36.8 C) 85 18 97 %  10/18/22 0456 (!) 110/53 98.8 F (37.1 C) 88 18 100 %  10/17/22 2112 -- -- 81 -- 96 %  10/17/22 2047 (!) 121/56 99.4 F (37.4 C) 92 18 (!) 87 %       PHYSICAL EXAM:  General: awake, confused, agitated Lungs: b/l air entry. Heart: s1s2 Abdomen: Soft, Extremities: left AVG site -okay Skin: No rashes or lesions. Or bruising  CNS rt lower extremity movement less compared to the rest  Lab Results    Latest Ref Rng & Units 10/18/2022    4:53 AM 10/17/2022   12:17 PM 10/16/2022    5:45 AM  CBC  WBC 4.0 - 10.5 K/uL 8.1  8.9  9.4   Hemoglobin 12.0 - 15.0 g/dL 9.7  9.3  9.5   Hematocrit 36.0 - 46.0 % 33.9  32.2  33.0   Platelets 150 - 400 K/uL 215  225  200        Latest Ref Rng & Units 10/18/2022    4:53 AM 10/17/2022   12:17 PM 10/16/2022    5:45 AM  CMP  Glucose 70 - 99 mg/dL 124  148  172   BUN 8 - 23 mg/dL 39  35  26   Creatinine 0.44 - 1.00 mg/dL 9.48  8.18  6.31   Sodium 135 - 145 mmol/L 140  140  136   Potassium 3.5 - 5.1 mmol/L 3.4  3.4  3.3   Chloride 98 - 111 mmol/L 103  100  103   CO2 22 - 32 mmol/L 25  27  27    Calcium 8.9 - 10.3 mg/dL 9.9  10.2  9.9     Erythrocyte Sedimentation Rate     Component Value Date/Time   ESRSEDRATE 70 (H) 10/13/2022 0006      Microbiology: 10/12/22 BC- NG so far 3/21 psoas abscess    Studies/Results: No results found.   Assessment/Plan:  Lumbar discitis,L4-L5  epidural phlegmon and b/l psoas abscess on MRI Underwent IR aspiration of psoas abscess on 10/13/22 Vanco and cefepime stated on 10/13/22 after procedure Gram stain numerous wbc no organism Culture positive for staph capitis a coag neg bacteria Blood culture ng On 03/19/22 she had 4/4 blood culture positive for staph capitis At that time she had left breast swelling , induration initially thought to be an abscess post lumpectomy on 03/04/22 for malignancy. US done showed complicated cystic lesion measuring 14.5 x 6.3 x 9.2 cm. Left breast. IR aspirated 135 ml of old blood from the left breast and culture was negative She was then treated with a total of 2 weeks of appropriate antibiotic for the pan sensitive staph capitis ( Iv ceftriaxone, vanco and then cefazolin) The 2 staph capitis have slightly different susceptibility pattern- the one in Aug from blood was pan sensitive and the one from the abscess has resistance to clinda and erythromycin  Is the lumbar infection indolent from Aug bacteremia, or is it from a new infection because she is at risk for one due to ESRD, being on dialysis and having DDD Or did she have endocarditis and seeded the spine. Last echo from 03/22/22 did not show any valve vegetations. Blood culture this time negative Will repeat 2 d echo   Will now need 6 weeks- 8 weeks of IV antibiotic On vancomycin and cefepime Dc cefepime Will change vanco to cefazolin  Confusion/agitation-  Could be medication- Opioids/cefepime Sh refused  dialysis again today ? MRI brain   ESRD on dialysis Recent malfunctioning AVG and underwent fistulogram, angioplasty and stent placement on 09/22/22 Recommend tagged WBC but patient is agitated and may not be able to lay still for the test     Left breast Carcinoma- s/p  lumpectomy and targeted axillary dissection She did not want to take chemo/radiation     Anemia   DM -    HTN on metoprolol  H/o Pulmonary embolism H/o CVA  Discussed the management with care team

## 2022-10-18 NOTE — Progress Notes (Signed)
Physical Therapy Treatment Patient Details Name: Cynthia Dean MRN: [id number] DOB: Dec 25, 1950 Today's Date: 10/18/2022   History of Present Illness Cynthia Dean is a 72 y.o. 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 She presents to the ED with worsening lower extremity pain.    PT Comments    Pt received upright in bed. Generally fidgety with her Ue's grabbing for things. Pt is alert and pleasant. Does not appear agitated or anxious this date, mildly restless. Pt reliant on heavy physical support just to sit EOB. Reliant on multimodal cuing and max hand over hand direction to attempt to maintain static sitting balance. Frequent post LoB in sitting despite hand and LE placements.  After ~ 3 minutes pt becomes mildly anxious but unable to articulate her needs and appears to be having some pain thus maxA+1 to return to supine. MaxA+2 with NT to boost up in bed. Left in care of NT. Continue to recommend additional rehab services at discharge.    Recommendations for follow up therapy are one component of a multi-disciplinary discharge planning process, led by the attending physician.  Recommendations may be updated based on patient status, additional functional criteria and insurance authorization.  Follow Up Recommendations  Can patient physically be transported by private vehicle: No    Assistance Recommended at Discharge Frequent or constant Supervision/Assistance  Patient can return home with the following A lot of help with walking and/or transfers;A lot of help with bathing/dressing/bathroom;Assistance with cooking/housework;Assist for transportation;Help with stairs or ramp for entrance   Equipment Recommendations       Recommendations for Other Services       Precautions / Restrictions Precautions Precautions:  Fall Restrictions Weight Bearing Restrictions: No     Mobility  Bed Mobility Overal bed mobility: Needs Assistance Bed Mobility: Supine to Sit, Sit to Supine     Supine to sit: Min assist, HOB elevated Sit to supine: Max assist   General bed mobility comments: maxA+2 for sliding up towards [facility] Patient Response: Cooperative, Flat affect  Transfers                   General transfer comment: unable due to poor sitting tolerance    Ambulation/Gait               General Gait Details: deferred   Stairs             Wheelchair Mobility    Modified Rankin (Stroke Patients Only)       Balance Overall balance assessment: Needs assistance Sitting-balance support: Bilateral upper extremity supported, Feet supported Sitting balance-Leahy Scale: Poor Sitting balance - Comments: Heavy posterior lean, reliant on max HHA and/or support at trunk to maintain upright sitting. Postural control: Posterior lean                                  Cognition Arousal/Alertness: Awake/alert Behavior During Therapy: Restless, Flat affect Overall Cognitive Status: No family/caregiver present to determine baseline cognitive functioning                                          Exercises      General Comments        Pertinent Vitals/Pain Pain Assessment Pain  Assessment: Faces Faces Pain Scale: No hurt    Home Living                          Prior Function            PT Goals (current goals can now be found in the care plan section) Acute Rehab PT Goals PT Goal Formulation: Patient unable to participate in goal setting    Frequency    Min 2X/week      PT Plan Current plan remains appropriate    Co-evaluation              AM-PAC PT "6 Clicks" Mobility   Outcome Measure  Help needed turning from your back to your side while in a flat bed without using bedrails?: A Little Help needed moving from  lying on your back to sitting on the side of a flat bed without using bedrails?: A Little Help needed moving to and from a bed to a chair (including a wheelchair)?: Total Help needed standing up from a chair using your arms (e.g., wheelchair or bedside chair)?: Total Help needed to walk in hospital room?: Total Help needed climbing 3-5 steps with a railing? : Total 6 Click Score: 10    End of Session Equipment Utilized During Treatment: Other (comment) Activity Tolerance: Patient tolerated treatment well Patient left: in bed;with call bell/phone within reach;with bed alarm set;with nursing/sitter in room Nurse Communication: Mobility status PT Visit Diagnosis: Unsteadiness on feet (R26.81);Other abnormalities of gait and mobility (R26.89);Muscle weakness (generalized) (M62.81);Difficulty in walking, not elsewhere classified (R26.2)     Time: [id number] PT Time Calculation (min) (ACUTE ONLY): 14 min  Charges:  $Therapeutic Activity: 8-22 mins                     Salem Caster. Fairly IV, PT, DPT Physical Therapist- [facility]  10/18/2022, 12:14 PM

## 2022-10-18 NOTE — Progress Notes (Addendum)
PROGRESS NOTE   HPI:   72 y/o F w/ PMH of ESRD on HD, HLD, COPD, DM2, HTN, depression, CVA who presents w/ back pain x 3 weeks. Hx was obtained from pt and pt's daughter. Pt is a poor historian. The back pain has become progressively worse. The pain is located in the lower back that is stabbing, intermittent w/ radiation down both legs. Pain meds helps with the pain and nothing makes the pain worse. The severity is currently 7/10. Pt denies any recents falls and/or trauma. Pt uses a walker.  Pt also c/o subjective fever. Pt denies any sweating, chest pain, cough, shortness of breath, nausea, vomiting, abd pain, urinary urgency, urinary frequency, diarrhea or constipation.   Pt presented w/ back pain and was found to intradiscal abscess/lumbar discitis/b/l psoas abscess on MRI. Pt was initially put on IV vanco & cefepime. Abxs changed to IV cefazolin & vanco was continued as per ID. Wound cx growing staph capitis. Will need 6-8 weeks of IV abxs as per ID.  Not a good surgical candidate as per neuro surg. Of note, pt was been very confused/agitated and pt even refused HD on 10/17/22 & 10/17/32.    Cynthia Dean  [id number] DOB: [birth year] DOA: 10/12/2022 PCP: Lowella Bandy, MD    Assessment & Plan:   Principal Problem:   Back pain Active Problems:   Anemia   ESRD on dialysis [facility])   Essential hypertension   Dyslipidemia   Seizure disorder ([place])   Diabetic retinopathy without macular edema associated with type 2 diabetes mellitus (HCC)   GERD (gastroesophageal reflux disease)   Diabetic neuropathy (HCC)   Anemia of chronic disease   Insulin-requiring or dependent type II diabetes mellitus ([place])   Discitis   Myositis   Psoas abscess (HCC)  Assessment and Plan:  Possible Discitis/osteomyelitis at L4-L5: w/ suspected intradiscal abscess as per MRI. Source unknown. S/p CT aspiration of right psoas muscle abscess yielding 14mL of purulent material. Cx growing staph capitis.   Continue on IV vanco, d/c IV cefepime & start IV cefazolin. Repeat blood cxs NGTD. Echo ordered    Possible Infectious myositis: of the adjacent psoas muscles with bilateral psoas abscesses, worse on the right as per MRI. Source unknown. Continue on IV vanco, d/c IV cefepime & start IV cefazolin as per ID. Cx growing staph capitis. Echo ordered. ID following and recs apprec    ESRD: on HD MWF. Pt refused HD on 10/17/22 & again refused on 10/18/22. Nephro following and recs apprec  Encephalopathy: etiology unclear, possibly secondary to above infection vs delirium vs dementia. Re-orient prn. Continue on IV abxs   Generalized weakness: PT recs SNF   ACD: likely secondary ESRD. H&H are stable   Hypokalemia: managed w/ HD    Hx of seizures: continue on home dose of keppra  HLD: continue on statin   Hx of COPD: w/o exacerbation. Continue on bronchodilators    DM2: well controlled, HbA1c 5.2. Continue on SSI w/ accuchecks    Peripheral neuropathy: likely secondary to DM2. Continue on home dose of gabapentin    HTN: Continue on BB     GERD: continue on PPI   Depression: severity unknown. Continue on home dose of sertraline     Overweight: BMI 28.2. Would benefit from weight loss         DVT prophylaxis: heparin SQ Code Status: full  Family Communication: no family at bedside this morning  Disposition Plan: PT recs SNF  Level of  care: Telemetry Medical Status is: Inpatient Remains inpatient appropriate because: severity of illness    Consultants:  ID IR Neuro surg   Procedures:   Antimicrobials:   Subjective: Pt is confused.   Objective: Vitals:   10/17/22 1545 10/17/22 2047 10/17/22 2112 10/18/22 0456  BP: (!) 141/40 (!) 121/56  (!) 110/53  Pulse: 89 92 81 88  Resp: 16 18  18   Temp:  99.4 F (37.4 C)  98.8 F (37.1 C)  TempSrc:      SpO2: 94% (!) 87% 96% 100%  Weight:      Height:        Intake/Output Summary (Last 24 hours) at 10/18/2022 0809 Last  data filed at 10/17/2022 1528 Gross per 24 hour  Intake 200 ml  Output --  Net 200 ml    Filed Weights   10/14/22 0759 10/14/22 1159 10/17/22 1444  Weight: 96.5 kg 95.4 kg 95.5 kg    Examination:  General exam: Appears confused but calm  Respiratory system: decreased breath sounds b/l  Cardiovascular system: S1/S2+. No rubs or clicks  Gastrointestinal system: Abd is soft, NT, obese & hypoactive bowel sounds    Central nervous system: Awake and confused.  Psychiatry: judgement and insight appears poor. Flat mood and affect    Data Reviewed: I have personally reviewed following labs and imaging studies  CBC: Recent Labs  Lab 10/12/22 0605 10/13/22 1146 10/14/22 0810 10/15/22 0615 10/16/22 0545 10/17/22 1217 10/18/22 0453  WBC 10.2   < > 7.7 8.8 9.4 8.9 8.1  NEUTROABS 7.6  --   --   --   --   --   --   HGB 9.6*   < > 9.4* 9.6* 9.5* 9.3* 9.7*  HCT 35.0*   < > 31.7* 32.9* 33.0* 32.2* 33.9*  MCV 86.0   < > 82.8 81.6 82.5 82.8 82.7  PLT 199   < > 224 226 200 225 215   < > = values in this interval not displayed.   Basic Metabolic Panel: Recent Labs  Lab 10/14/22 0810 10/15/22 0615 10/16/22 0545 10/17/22 1217 10/18/22 0453  NA 137 134* 136 140 140  K 3.2* 3.0* 3.3* 3.4* 3.4*  CL 99 99 103 100 103  CO2 26 27 27 27 25   GLUCOSE 91 179* 172* 148* 124*  BUN 25* 18 26* 35* 39*  CREATININE 6.30* 4.83* 6.31* 8.18* 9.48*  CALCIUM 9.2 9.3 9.9 10.2 9.9   GFR: Estimated Creatinine Clearance: 7.1 mL/min (A) (by C-G formula based on SCr of 9.48 mg/dL (H)). Liver Function Tests: No results for input(s): "AST", "ALT", "ALKPHOS", "BILITOT", "PROT", "ALBUMIN" in the last 168 hours. No results for input(s): "LIPASE", "AMYLASE" in the last 168 hours. No results for input(s): "AMMONIA" in the last 168 hours. Coagulation Profile: No results for input(s): "INR", "PROTIME" in the last 168 hours. Cardiac Enzymes: No results for input(s): "CKTOTAL", "CKMB", "CKMBINDEX", "TROPONINI"  in the last 168 hours. BNP (last 3 results) No results for input(s): "PROBNP" in the last 8760 hours. HbA1C: No results for input(s): "HGBA1C" in the last 72 hours.  CBG: Recent Labs  Lab 10/16/22 2022 10/17/22 0935 10/17/22 1146 10/17/22 1738 10/17/22 2049  GLUCAP 118* 135* 140* 110* 119*   Lipid Profile: No results for input(s): "CHOL", "HDL", "LDLCALC", "TRIG", "CHOLHDL", "LDLDIRECT" in the last 72 hours. Thyroid Function Tests: No results for input(s): "TSH", "T4TOTAL", "FREET4", "T3FREE", "THYROIDAB" in the last 72 hours. Anemia Panel: No results for input(s): "VITAMINB12", "FOLATE", "FERRITIN", "TIBC", "  IRON", "RETICCTPCT" in the last 72 hours. Sepsis Labs: Recent Labs  Lab 10/12/22 1740 10/13/22 0006  LATICACIDVEN 1.5 0.8    Recent Results (from the past 240 hour(s))  Blood culture (routine x 2)     Status: None   Collection Time: 10/12/22  5:40 PM   Specimen: BLOOD  Result Value Ref Range Status   Specimen Description BLOOD BLOOD RIGHT HAND [facility]  Final   Special Requests   Final    BOTTLES DRAWN AEROBIC AND ANAEROBIC Blood Culture results may not be optimal due to an inadequate volume of blood received in culture bottles   Culture   Final    NO GROWTH 5 DAYS Performed at [facility], [address] [place], [place] [address]    Report Status 10/17/2022 FINAL  Final  Blood culture (routine x 2)     Status: None   Collection Time: 10/12/22  6:56 PM   Specimen: BLOOD  Result Value Ref Range Status   Specimen Description BLOOD BLOOD RIGHT FOREARM  Final   Special Requests   Final    BOTTLES DRAWN AEROBIC ONLY Blood Culture results may not be optimal due to an inadequate volume of blood received in culture bottles   Culture   Final    NO GROWTH 5 DAYS Performed at [facility], [address] [place], [place] [address]    Report Status 10/17/2022 FINAL  Final  Aerobic/Anaerobic Culture w Gram Stain (surgical/deep wound)     Status: None  (Preliminary result)   Collection Time: 10/13/22 11:44 AM   Specimen: Abscess  Result Value Ref Range Status   Specimen Description   Final    ABSCESS Performed at [facility], [address] [place], [place] [address]    Special Requests   Final    MUSCLE ABSCESS Performed at [facility], [address] [place], [place] [address]    Gram Stain   Final    ABUNDANT WBC PRESENT, PREDOMINANTLY PMN NO ORGANISMS SEEN Performed at [facility], [place] [address] [place], [place] [address]    Culture   Final    FEW STAPHYLOCOCCUS CAPITIS NO ANAEROBES ISOLATED; CULTURE IN PROGRESS FOR 5 DAYS    Report Status PENDING  Incomplete   Organism ID, Bacteria STAPHYLOCOCCUS CAPITIS  Final      Susceptibility   Staphylococcus capitis - MIC*    CIPROFLOXACIN <=0.5 SENSITIVE Sensitive     ERYTHROMYCIN >=8 RESISTANT Resistant     GENTAMICIN <=0.5 SENSITIVE Sensitive     OXACILLIN <=0.25 SENSITIVE Sensitive     TETRACYCLINE <=1 SENSITIVE Sensitive     VANCOMYCIN 1 SENSITIVE Sensitive     TRIMETH/SULFA <=10 SENSITIVE Sensitive     CLINDAMYCIN INTERMEDIATE Intermediate     RIFAMPIN <=0.5 SENSITIVE Sensitive     Inducible Clindamycin NEGATIVE Sensitive     * FEW STAPHYLOCOCCUS CAPITIS         Radiology Studies: No results found.      Scheduled Meds:  (feeding supplement) PROSource Plus  30 mL Oral BID BM   atorvastatin  80 mg Oral QHS   Chlorhexidine Gluconate Cloth  6 each Topical Q0600   feeding supplement (NEPRO CARB STEADY)  237 mL Oral TID BM   gabapentin  100 mg Oral BID   heparin injection (subcutaneous)  5,000 Units Subcutaneous Q8H   insulin aspart  0-9 Units Subcutaneous TID WC   levETIRAcetam  1,000 mg Oral Daily   levETIRAcetam  250 mg  Oral Q M,W,F   losartan  100 mg Oral Daily   metoprolol succinate  100 mg Oral Daily   multivitamin  1 tablet Oral QHS   nystatin  5 mL Oral QID   pantoprazole  20 mg Oral BID   sertraline  25 mg Oral  Daily   sodium chloride flush  10-40 mL Intracatheter Q12H   umeclidinium-vilanterol  1 puff Inhalation Daily   Continuous Infusions:  anticoagulant sodium citrate     vancomycin Stopped (10/14/22 1155)     LOS: 6 days    Time spent: 25 mins     Wyvonnia Dusky, MD [facility] Pager [phone]  If 7PM-7AM, please contact night-coverage [email or web address] 10/18/2022, 8:09 AM

## 2022-10-18 NOTE — Progress Notes (Signed)
[facility]  ROUNDING NOTE   Subjective:   Cynthia Dean is a 72 y.o. 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 She presents to the ED with worsening lower extremity pain. Patient will be admitted for Back pain [M54.9] Bilateral leg pain [M79.604, M79.605]  Patient is known to our practice and receives outpatient dialysis treatments at [facility] on a MWF schedule, supervised by Dr Candiss Norse.   Patient seen resting quietly in bed Alert, drowsy When asked if she would like dialysis today, she clearly states no   Objective:  Vital signs in last 24 hours:  Temp:  [97.8 F (36.6 C)-99.4 F (37.4 C)] 98.2 F (36.8 C) (03/26 0829) Pulse Rate:  [81-96] 85 (03/26 0829) Resp:  [16-19] 18 (03/26 0829) BP: (99-141)/(40-85) 123/52 (03/26 0829) SpO2:  [87 %-100 %] 97 % (03/26 0829) Weight:  [95.5 kg] 95.5 kg (03/25 1444)  Weight change:  Filed Weights   10/14/22 0759 10/14/22 1159 10/17/22 1444  Weight: 96.5 kg 95.4 kg 95.5 kg    Intake/Output: I/O last 3 completed shifts: In: 200 [IV Piggyback:200] Out: -    Intake/Output this shift:  No intake/output data recorded.  Physical Exam: General: NAD,   Head: Normocephalic, atraumatic. Moist oral mucosal membranes  Eyes: Anicteric  Lungs:  Clear to auscultation, normal effort  Heart: Regular rate and rhythm  Abdomen:  Soft, nontender  Extremities: Trace peripheral edema.  Neurologic: Somnolent, moving all four extremities  Skin: No lesions  Access: Left AVG    Basic Metabolic Panel: Recent Labs  Lab 10/14/22 0810 10/15/22 0615 10/16/22 0545 10/17/22 1217 10/18/22 0453  NA 137 134* 136 140 140  K 3.2* 3.0* 3.3* 3.4* 3.4*  CL 99 99 103 100 103  CO2 26 27 27 27 25   GLUCOSE 91 179* 172* 148* 124*  BUN 25* 18 26* 35* 39*  CREATININE 6.30* 4.83* 6.31* 8.18* 9.48*   CALCIUM 9.2 9.3 9.9 10.2 9.9     Liver Function Tests: No results for input(s): "AST", "ALT", "ALKPHOS", "BILITOT", "PROT", "ALBUMIN" in the last 168 hours. No results for input(s): "LIPASE", "AMYLASE" in the last 168 hours. No results for input(s): "AMMONIA" in the last 168 hours.  CBC: Recent Labs  Lab 10/12/22 0605 10/13/22 1146 10/14/22 0810 10/15/22 0615 10/16/22 0545 10/17/22 1217 10/18/22 0453  WBC 10.2   < > 7.7 8.8 9.4 8.9 8.1  NEUTROABS 7.6  --   --   --   --   --   --   HGB 9.6*   < > 9.4* 9.6* 9.5* 9.3* 9.7*  HCT 35.0*   < > 31.7* 32.9* 33.0* 32.2* 33.9*  MCV 86.0   < > 82.8 81.6 82.5 82.8 82.7  PLT 199   < > 224 226 200 225 215   < > = values in this interval not displayed.     Cardiac Enzymes: No results for input(s): "CKTOTAL", "CKMB", "CKMBINDEX", "TROPONINI" in the last 168 hours.  BNP: Invalid input(s): "POCBNP"  CBG: Recent Labs  Lab 10/17/22 1146 10/17/22 1738 10/17/22 2049 10/18/22 0831 10/18/22 1132  GLUCAP 140* 110* 119* 128* 115*     Microbiology: Results for orders placed or performed during the hospital encounter of 10/12/22  Blood culture (routine x 2)     Status: None   Collection Time: 10/12/22  5:40 PM   Specimen: BLOOD  Result Value Ref Range  Status   Specimen Description BLOOD BLOOD RIGHT HAND [facility]  Final   Special Requests   Final    BOTTLES DRAWN AEROBIC AND ANAEROBIC Blood Culture results may not be optimal due to an inadequate volume of blood received in culture bottles   Culture   Final    NO GROWTH 5 DAYS Performed at [facility], [address] [place], [place] [address]    Report Status 10/17/2022 FINAL  Final  Blood culture (routine x 2)     Status: None   Collection Time: 10/12/22  6:56 PM   Specimen: BLOOD  Result Value Ref Range Status   Specimen Description BLOOD BLOOD RIGHT FOREARM  Final   Special Requests   Final    BOTTLES DRAWN AEROBIC ONLY Blood Culture results may not be optimal due to  an inadequate volume of blood received in culture bottles   Culture   Final    NO GROWTH 5 DAYS Performed at [facility], [address], [place], [place] [address]    Report Status 10/17/2022 FINAL  Final  Aerobic/Anaerobic Culture w Gram Stain (surgical/deep wound)     Status: None   Collection Time: 10/13/22 11:44 AM   Specimen: Abscess  Result Value Ref Range Status   Specimen Description   Final    ABSCESS Performed at [facility], [address], [place], [place] [address]    Special Requests   Final    MUSCLE ABSCESS Performed at [facility], [address] [place], [place] [address]    Gram Stain   Final    ABUNDANT WBC PRESENT, PREDOMINANTLY PMN NO ORGANISMS SEEN    Culture   Final    FEW STAPHYLOCOCCUS CAPITIS NO ANAEROBES ISOLATED Performed at [facility], [address] [place], [place] [address]    Report Status 10/18/2022 FINAL  Final   Organism ID, Bacteria STAPHYLOCOCCUS CAPITIS  Final      Susceptibility   Staphylococcus capitis - MIC*    CIPROFLOXACIN <=0.5 SENSITIVE Sensitive     ERYTHROMYCIN >=8 RESISTANT Resistant     GENTAMICIN <=0.5 SENSITIVE Sensitive     OXACILLIN <=0.25 SENSITIVE Sensitive     TETRACYCLINE <=1 SENSITIVE Sensitive     VANCOMYCIN 1 SENSITIVE Sensitive     TRIMETH/SULFA <=10 SENSITIVE Sensitive     CLINDAMYCIN INTERMEDIATE Intermediate     RIFAMPIN <=0.5 SENSITIVE Sensitive     Inducible Clindamycin NEGATIVE Sensitive     * FEW STAPHYLOCOCCUS CAPITIS    Coagulation Studies: No results for input(s): "LABPROT", "INR" in the last 72 hours.  Urinalysis: No results for input(s): "COLORURINE", "LABSPEC", "PHURINE", "GLUCOSEU", "HGBUR", "BILIRUBINUR", "KETONESUR", "PROTEINUR", "UROBILINOGEN", "NITRITE", "LEUKOCYTESUR" in the last 72 hours.  Invalid input(s): "APPERANCEUR"    Imaging: No results found.   Medications:    anticoagulant sodium citrate     [START ON 10/19/2022]  ceFAZolin  (ANCEF) IV     vancomycin Stopped (10/14/22 1155)    (feeding supplement) PROSource Plus  30 mL Oral BID BM   atorvastatin  80 mg Oral QHS   Chlorhexidine Gluconate Cloth  6 each Topical Q0600   feeding supplement (NEPRO CARB STEADY)  237 mL Oral TID BM   gabapentin  100 mg Oral BID   heparin injection (subcutaneous)  5,000 Units Subcutaneous Q8H   insulin aspart  0-9 Units Subcutaneous TID WC   levETIRAcetam  1,000 mg Oral Daily   levETIRAcetam  250 mg Oral Q M,W,F   losartan  100 mg Oral Daily   metoprolol succinate  100 mg Oral Daily   multivitamin  1 tablet Oral QHS   nystatin  5 mL Oral QID   pantoprazole  20 mg Oral BID   sertraline  25 mg Oral Daily   sodium chloride flush  10-40 mL Intracatheter Q12H   umeclidinium-vilanterol  1 puff Inhalation Daily   acetaminophen **OR** acetaminophen, alteplase, anticoagulant sodium citrate, heparin, hydrALAZINE, lidocaine (PF), lidocaine-prilocaine, morphine injection, pentafluoroprop-tetrafluoroeth, senna-docusate, sodium chloride flush, traMADol  Assessment/ Plan:  Ms. Czeslawa Weister is a 72 y.o.  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 She presents to the ED with worsening lower extremity pain. Patient will be admitted for Back pain [M54.9] Bilateral leg pain [M79.604, M79.605]  [facility]/MWF/Lt AVG /102.5kg  End-stage renal disease on hemodialysis.  Will maintain outpatient schedule if possible.  Offered patient makeup dialysis treatment today, she clearly said no. Due to concern for safety, will not proceed with treatment today. Will attempt scheduled treatment tomorrow. Due to patient's confusion, would recommend palliative care consult to establish goals of care with family.   2. Anemia of chronic kidney disease Normocytic Lab Results  Component Value Date   HGB 9.7 (L)  10/18/2022  Patient receives Mircera at outpatient clinic.  Hemoglobin remains within desired range  3. Secondary Hyperparathyroidism: with outpatient labs: PTH 180 (3/11), phosphorus 5.4, calcium 9.3   Lab Results  Component Value Date   PTH 131 (H) 03/14/2018   CALCIUM 9.9 10/18/2022   CAION 1.09 (L) 08/28/2022   PHOS 5.4 (H) 09/09/2022  Will continue to monitor bone minerals.  4.  Hypertension with chronic kidney disease.  Home regimen includes losartan, metoprolol, and torsemide.  Prescribed losartan and metoprolol.     5. Diabetes mellitus type II with chronic kidney disease/renal manifestations: insulin dependent. Home regimen includes Novolin 70/30. Most recent hemoglobin A1c is 5.8 on 03/23/22.   Glucose well controlled  6. Vertebral osteomyelitis, seen on CT lumber spine. Neurosurgery feels patient is not a surgical candidate Recommend antibiotics. ID following. CT guided aspiration of psoas abscess on 10/13/22.   Receiving daily cefepime and vancomycin with dialysis treatments    LOS: 6   3/26/20241:42 PM

## 2022-10-18 NOTE — Consult Note (Signed)
Pharmacy Antibiotic Note  Cynthia Dean is a 72 y.o. female admitted on 10/12/2022 with  discitis and bilateral psoas abscess .  Pharmacy has been consulted for cefazolin dosing. Of note, patient receives hemodialysis MWF outpatient. Plan is to continue regimen while inpatient. IR aspiration of abscess on 3/21 with culture growing Methicillin-susceptible S. Capitis.   Today, 10/18/2022 Day #6 antibiotics Renal: ESRD on HD MWF Missed HD 3/25 d/t agitation/combativeness Stopped cefepime 3/25 in case contributing WBC WNL Afebrile Psoas abscess cx: Methicillin-susc S capitis  Plan Based on cultures,  change vancomycin to cefazolin 2gm IV x 1 today then 1gm IV q24h (after HD) Follow-up plan to dose cefazolin with HD as outpatient - will adjust regimen/dose  Height: 6' (182.9 cm) Weight: 95.5 kg (210 lb 8.6 oz) IBW/kg (Calculated) : 73.1  Temp (24hrs), Avg:98.6 F (37 C), Min:97.8 F (36.6 C), Max:99.4 F (37.4 C)  Recent Labs  Lab 10/12/22 1740 10/13/22 0006 10/13/22 1146 10/14/22 0810 10/15/22 0615 10/16/22 0545 10/17/22 1217 10/18/22 0453  WBC  --   --    < > 7.7 8.8 9.4 8.9 8.1  CREATININE  --   --    < > 6.30* 4.83* 6.31* 8.18* 9.48*  LATICACIDVEN 1.5 0.8  --   --   --   --   --   --    < > = values in this interval not displayed.     Estimated Creatinine Clearance: 7.1 mL/min (A) (by C-G formula based on SCr of 9.48 mg/dL (H)).    Allergies  Allergen Reactions   Oxycodone Nausea And Vomiting   Oxycodone-Acetaminophen Other (See Comments)   Wound Dressing Adhesive    Hydrocodone     Intolerant more than allergic   Tape Itching    Skin Dermatitis/itching (tape adhesive) Skin Dermatitis/itching (tape adhesive)   Tapentadol Itching    Skin Dermatitis/itching (tape adhesive) Skin Dermatitis/itching (tape adhesive)     Antimicrobials this admission: Vancomycin 3/21 >> 3/26 Cefepime 3/21 >> 3/25 Cefazolin 3/26 >>  Dose adjustments this admission:  ESRD  Microbiology results: 3/20 BCx: NGTD 3/21 Abscess cx: meth-susc Staph capitis 3/21 MRSA PCR: pending  Thank you for allowing pharmacy to be a part of this patient's care.  Doreene Eland, PharmD, BCPS, BCIDP Work Cell: [phone] 10/18/2022 8:32 AM

## 2022-10-19 ENCOUNTER — Inpatient Hospital Stay (HOSPITAL_COMMUNITY)
Admit: 2022-10-19 | Discharge: 2022-10-19 | Disposition: A | Discharge status: Home / Self Care | Payer: 59 | Attending: Infectious Diseases | Admitting: Infectious Diseases

## 2022-10-19 DIAGNOSIS — M5442 Lumbago with sciatica, left side: Secondary | ICD-10-CM | POA: Diagnosis not present

## 2022-10-19 DIAGNOSIS — R410 Disorientation, unspecified: Secondary | ICD-10-CM | POA: Diagnosis not present

## 2022-10-19 DIAGNOSIS — I38 Endocarditis, valve unspecified: Secondary | ICD-10-CM

## 2022-10-19 DIAGNOSIS — K6812 Psoas muscle abscess: Secondary | ICD-10-CM | POA: Diagnosis not present

## 2022-10-19 DIAGNOSIS — N186 End stage renal disease: Secondary | ICD-10-CM | POA: Diagnosis not present

## 2022-10-19 DIAGNOSIS — M5441 Lumbago with sciatica, right side: Secondary | ICD-10-CM | POA: Diagnosis not present

## 2022-10-19 DIAGNOSIS — M4646 Discitis, unspecified, lumbar region: Secondary | ICD-10-CM | POA: Diagnosis not present

## 2022-10-19 DIAGNOSIS — Z515 Encounter for palliative care: Secondary | ICD-10-CM | POA: Diagnosis not present

## 2022-10-19 DIAGNOSIS — Z992 Dependence on renal dialysis: Secondary | ICD-10-CM | POA: Diagnosis not present

## 2022-10-19 DIAGNOSIS — M545 Low back pain, unspecified: Secondary | ICD-10-CM | POA: Diagnosis not present

## 2022-10-19 LAB — GLUCOSE, CAPILLARY
Glucose-Capillary: 123 mg/dL — ABNORMAL HIGH (ref 70–99)
Glucose-Capillary: 124 mg/dL — ABNORMAL HIGH (ref 70–99)
Glucose-Capillary: 100 mg/dL — ABNORMAL HIGH (ref 70–99)
Glucose-Capillary: 98 mg/dL (ref 70–99)

## 2022-10-19 LAB — BASIC METABOLIC PANEL
Anion gap: 14 (ref 5–15)
BUN: 47 mg/dL — ABNORMAL HIGH (ref 8–23)
CO2: 24 mmol/L (ref 22–32)
Calcium: 10.2 mg/dL (ref 8.9–10.3)
Chloride: 104 mmol/L (ref 98–111)
Creatinine, Ser: 10.85 mg/dL — ABNORMAL HIGH (ref 0.44–1.00)
GFR, Estimated: 3 mL/min — ABNORMAL LOW (ref >60)
Glucose, Bld: 128 mg/dL — ABNORMAL HIGH (ref 70–99)
Potassium: 3.6 mmol/L (ref 3.5–5.1)
Sodium: 142 mmol/L (ref 135–145)

## 2022-10-19 LAB — CBC
HCT: 33.4 % — ABNORMAL LOW (ref 36.0–46.0)
Hemoglobin: 9.6 g/dL — ABNORMAL LOW (ref 12.0–15.0)
MCH: 23.8 pg — ABNORMAL LOW (ref 26.0–34.0)
MCHC: 28.7 g/dL — ABNORMAL LOW (ref 30.0–36.0)
MCV: 82.7 fL (ref 80.0–100.0)
Platelets: 186 10*3/uL (ref 150–400)
RBC: 4.04 MIL/uL (ref 3.87–5.11)
RDW: 21.2 % — ABNORMAL HIGH (ref 11.5–15.5)
WBC: 7.5 10*3/uL (ref 4.0–10.5)
nRBC: 0 % (ref 0.0–0.2)

## 2022-10-19 LAB — ECHOCARDIOGRAM COMPLETE
Height: 72 in
S' Lateral: 3.7 cm
Weight: 3368.63 oz

## 2022-10-19 MED ORDER — LORAZEPAM 1 MG PO TABS: 1.0000 mg | ORAL_TABLET | Freq: Once | ORAL | Status: DC

## 2022-10-19 MED ORDER — ACETAMINOPHEN 500 MG PO TABS
1000.0000 mg | ORAL_TABLET | Freq: Three times a day (TID) | ORAL | Status: DC
  Administered 2022-10-19 – 2022-10-31 (×29): 1000 mg via ORAL
  Filled 2022-10-19 (×31): qty 2

## 2022-10-19 MED ORDER — LORAZEPAM 1 MG PO TABS
1.0000 mg | ORAL_TABLET | Freq: Four times a day (QID) | ORAL | Status: DC | PRN
  Administered 2022-10-19 – 2023-01-04 (×29): 1 mg via ORAL
  Filled 2022-10-19 (×30): qty 1

## 2022-10-19 MED ORDER — HYDROMORPHONE HCL 2 MG PO TABS
1.0000 mg | ORAL_TABLET | Freq: Four times a day (QID) | ORAL | Status: DC | PRN
  Administered 2022-10-24 – 2022-10-30 (×7): 1 mg via ORAL
  Filled 2022-10-19 (×8): qty 1

## 2022-10-19 MED ORDER — LORAZEPAM 1 MG PO TABS
1.0000 mg | ORAL_TABLET | ORAL | Status: DC
  Administered 2022-10-21: 1 mg via ORAL
  Filled 2022-10-19 (×2): qty 1

## 2022-10-19 MED ORDER — CHLORHEXIDINE GLUCONATE CLOTH 2 % EX PADS
6.0000 | MEDICATED_PAD | Freq: Every day | CUTANEOUS | Status: DC
  Administered 2022-10-19 – 2022-10-21 (×3): 6 via TOPICAL

## 2022-10-19 MED ORDER — LORAZEPAM 1 MG PO TABS
1.0000 mg | ORAL_TABLET | Freq: Once | ORAL | Status: AC
  Administered 2022-10-20: 1 mg via ORAL
  Filled 2022-10-19: qty 1

## 2022-10-19 NOTE — Progress Notes (Signed)
*  PRELIMINARY RESULTS* Echocardiogram 2D Echocardiogram has been performed.  Sherrie Sport 10/19/2022, 7:44 AM

## 2022-10-19 NOTE — Progress Notes (Signed)
Nutrition Follow-up  DOCUMENTATION CODES:   Not applicable  INTERVENTION:   -Continue liberalized diet of regular -Continue renal MVI daily -Continue 30 ml Prosource Plus BID, each supplement provides 100 kcals and 15 grams protein -Continue Nepro Shake po TID, each supplement provides 425 kcal and 19 grams protein  -Palliative care consult pending; if pt continues to desire aggressive care, may need to consider enteral nutrition support:  Initiate Osmolite 1.5 @ 20 ml/hr and increase by 10 ml every 12 hours to goal rate of 60 ml/hr.   60 ml Prosource TF daily  30 ml free water flush every 4 hours  Tube feeding regimen provides 2240 kcal (100% of needs), 110 grams of protein, and 1097 ml of H2O. Total free water: 1277 ml daily    -If feedings are pursued, recommend monitoring Mg, K, and Phos daily and replete as needed due to high refeeding risk  NUTRITION DIAGNOSIS:   Increased nutrient needs related to chronic illness (ESRD on HD) as evidenced by estimated needs.  Ongoing  GOAL:   Patient will meet greater than or equal to 90% of their needs  Unmet  MONITOR:   PO intake, Supplement acceptance  REASON FOR ASSESSMENT:   Malnutrition Screening Tool    ASSESSMENT:   Pt with PMH of ESRD on HD, HLD, COPD, DM2, HTN, depression, CVA who presents with back pain x 3 weeks PTA.  3/21- s/p CT aspiration of rt psoas muscle (2 ml purulent material removed)   Reviewed I/O's: 0 ml x 24 hours and -1.2 L since admission  Per nephrology notes, last HD on 10/14/22. Pt either uncooperative in HD suite or refusing HD. Palliative care consult has been placed.   Pt lying in bed at time of visit. She did not engage with this RD and withdrew when RD attempted exam. Pt would not answer most questions and refused off of tray set-up. Also noted pt refused breakfast when nursing staff tried to encourage her.   Noted minimal intake and refusal of supplements.   If goals of care are to  remains aggressive, may need to consider alternative means of nutrition/ hydration (ex PEG).   Reviewed wt hx; pt has experienced a 1.9% wt loss over the past week, which is not significant for time frame.   Medications reviewed and include keppra.    Labs reviewed: CBGS: 123-124 (inpatient orders for glycemic control are 0-9 units insulin aspart TID with meals).    NUTRITION - FOCUSED PHYSICAL EXAM:  Flowsheet Row Most Recent Value  Orbital Region No depletion  Upper Arm Region No depletion  Thoracic and Lumbar Region No depletion  Buccal Region No depletion  Temple Region Mild depletion  Clavicle Bone Region Mild depletion  Clavicle and Acromion Bone Region Mild depletion  Scapular Bone Region Mild depletion  Dorsal Hand No depletion  Patellar Region No depletion  Anterior Thigh Region No depletion  Posterior Calf Region No depletion  Edema (RD Assessment) Mild  Hair Reviewed  Eyes Reviewed  Mouth Reviewed  Skin Reviewed  Nails Reviewed       Diet Order:   Diet Order             Diet regular Room service appropriate? Yes; Fluid consistency: Thin  Diet effective now                   EDUCATION NEEDS:   No education needs have been identified at this time  Skin:  Skin Assessment: Skin Integrity Issues: Skin  Integrity Issues:: Other (Comment) Other: IAD to lt buttocks  Last BM:  10/17/22  Height:   Ht Readings from Last 1 Encounters:  10/12/22 6' (1.829 m)    Weight:   Wt Readings from Last 1 Encounters:  10/19/22 94.7 kg    Ideal Body Weight:  72.7 kg  BMI:  Body mass index is 28.32 kg/m.  Estimated Nutritional Needs:   Kcal:  2150-2350  Protein:  105-120 grams  Fluid:  1000 ml + UOP    Loistine Chance, RD, LDN, [place] Registered Dietitian II Certified Diabetes Care and Education Specialist Please refer to [facility] for RD and/or RD on-call/weekend/after hours pager

## 2022-10-19 NOTE — Progress Notes (Signed)
Received patient on bed from her room. Patient looks calm but is disoriented. Hooked patient to cardiac monitor and continuous pulse ox. Consents verified for Hemodialysis. Patient started to get agitated and pulling contraptions. Attempted to cannulate patient but she started to become combative. Dr. Juleen China notified and saw the patient. Will transfer her back to her room. Floor RN notified.

## 2022-10-19 NOTE — Progress Notes (Signed)
[facility]  ROUNDING NOTE   Subjective:   Cynthia Dean is a 72 y.o. 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 She presents to the ED with worsening lower extremity pain. Patient will be admitted for Back pain [M54.9] Bilateral leg pain [M79.604, M79.605]  Patient is known to our practice and receives outpatient dialysis treatments at [facility] on a MWF schedule, supervised by Dr Candiss Norse.   Patient seen and evaluated in dialysis suite Patient alert but remains confused HD RN reports patient agitated with staff when performing pretreatment routines, placing blood pressure cuff, telemetry, assessing HD access Patient unable to verbalize if she wishes to have dialysis treatment today  Objective:  Vital signs in last 24 hours:  Temp:  [98.4 F (36.9 C)-98.8 F (37.1 C)] 98.6 F (37 C) (03/27 0943) Pulse Rate:  [65-85] 82 (03/27 0943) Resp:  [15-18] 15 (03/27 0943) BP: (113-133)/(49-90) 113/49 (03/27 0943) SpO2:  [92 %-98 %] 98 % (03/27 0943) Weight:  [94.7 kg] 94.7 kg (03/27 0943)  Weight change:  Filed Weights   10/14/22 1159 10/17/22 1444 10/19/22 0943  Weight: 95.4 kg 95.5 kg 94.7 kg    Intake/Output: No intake/output data recorded.   Intake/Output this shift:  No intake/output data recorded.  Physical Exam: General: NAD  Head: Normocephalic, atraumatic.  Dry oral mucosal membranes  Eyes: Anicteric  Lungs:  Clear to auscultation, normal effort  Heart: Regular rate and rhythm  Abdomen:  Soft, nontender  Extremities: Trace peripheral edema.  Neurologic: , moving all four extremities  Skin: No lesions  Access: Left AVG    Basic Metabolic Panel: Recent Labs  Lab 10/15/22 0615 10/16/22 0545 10/17/22 1217 10/18/22 0453 10/19/22 0600  NA 134* 136 140 140 142  K 3.0* 3.3* 3.4* 3.4* 3.6  CL 99 103 100  103 104  CO2 27 27 27 25 24   GLUCOSE 179* 172* 148* 124* 128*  BUN 18 26* 35* 39* 47*  CREATININE 4.83* 6.31* 8.18* 9.48* 10.85*  CALCIUM 9.3 9.9 10.2 9.9 10.2     Liver Function Tests: No results for input(s): "AST", "ALT", "ALKPHOS", "BILITOT", "PROT", "ALBUMIN" in the last 168 hours. No results for input(s): "LIPASE", "AMYLASE" in the last 168 hours. No results for input(s): "AMMONIA" in the last 168 hours.  CBC: Recent Labs  Lab 10/15/22 0615 10/16/22 0545 10/17/22 1217 10/18/22 0453 10/19/22 0600  WBC 8.8 9.4 8.9 8.1 7.5  HGB 9.6* 9.5* 9.3* 9.7* 9.6*  HCT 32.9* 33.0* 32.2* 33.9* 33.4*  MCV 81.6 82.5 82.8 82.7 82.7  PLT 226 200 225 215 186     Cardiac Enzymes: No results for input(s): "CKTOTAL", "CKMB", "CKMBINDEX", "TROPONINI" in the last 168 hours.  BNP: Invalid input(s): "POCBNP"  CBG: Recent Labs  Lab 10/18/22 0831 10/18/22 1132 10/18/22 1716 10/18/22 2137 10/19/22 0751  GLUCAP 128* 115* 126* 119* 74*     Microbiology: Results for orders placed or performed during the hospital encounter of 10/12/22  Blood culture (routine x 2)     Status: None   Collection Time: 10/12/22  5:40 PM   Specimen: BLOOD  Result Value Ref Range Status   Specimen Description BLOOD BLOOD RIGHT HAND [facility]  Final   Special Requests   Final    BOTTLES DRAWN AEROBIC AND ANAEROBIC Blood Culture results may not be optimal due to an inadequate volume of blood received in culture bottles  Culture   Final    NO GROWTH 5 DAYS Performed at [facility], [address] [place], [place] [address]    Report Status 10/17/2022 FINAL  Final  Blood culture (routine x 2)     Status: None   Collection Time: 10/12/22  6:56 PM   Specimen: BLOOD  Result Value Ref Range Status   Specimen Description BLOOD BLOOD RIGHT FOREARM  Final   Special Requests   Final    BOTTLES DRAWN AEROBIC ONLY Blood Culture results may not be optimal due to an inadequate volume of blood received in  culture bottles   Culture   Final    NO GROWTH 5 DAYS Performed at [facility], [address], [place], [place] [address]    Report Status 10/17/2022 FINAL  Final  Aerobic/Anaerobic Culture w Gram Stain (surgical/deep wound)     Status: None   Collection Time: 10/13/22 11:44 AM   Specimen: Abscess  Result Value Ref Range Status   Specimen Description   Final    ABSCESS Performed at [facility], [address], [place], [place] [address]    Special Requests   Final    MUSCLE ABSCESS Performed at [facility], [address] [place], [place] [address]    Gram Stain   Final    ABUNDANT WBC PRESENT, PREDOMINANTLY PMN NO ORGANISMS SEEN    Culture   Final    FEW STAPHYLOCOCCUS CAPITIS NO ANAEROBES ISOLATED Performed at [facility], [place] [address], [place], [place] [address]    Report Status 10/18/2022 FINAL  Final   Organism ID, Bacteria STAPHYLOCOCCUS CAPITIS  Final      Susceptibility   Staphylococcus capitis - MIC*    CIPROFLOXACIN <=0.5 SENSITIVE Sensitive     ERYTHROMYCIN >=8 RESISTANT Resistant     GENTAMICIN <=0.5 SENSITIVE Sensitive     OXACILLIN <=0.25 SENSITIVE Sensitive     TETRACYCLINE <=1 SENSITIVE Sensitive     VANCOMYCIN 1 SENSITIVE Sensitive     TRIMETH/SULFA <=10 SENSITIVE Sensitive     CLINDAMYCIN INTERMEDIATE Intermediate     RIFAMPIN <=0.5 SENSITIVE Sensitive     Inducible Clindamycin NEGATIVE Sensitive     * FEW STAPHYLOCOCCUS CAPITIS    Coagulation Studies: No results for input(s): "LABPROT", "INR" in the last 72 hours.  Urinalysis: No results for input(s): "COLORURINE", "LABSPEC", "PHURINE", "GLUCOSEU", "HGBUR", "BILIRUBINUR", "KETONESUR", "PROTEINUR", "UROBILINOGEN", "NITRITE", "LEUKOCYTESUR" in the last 72 hours.  Invalid input(s): "APPERANCEUR"    Imaging: ECHOCARDIOGRAM COMPLETE  Result Date: 10/19/2022    ECHOCARDIOGRAM REPORT   Patient Name:   Cynthia Dean Date of Exam: 10/19/2022 Medical  Rec #:  [id number]              Height:       72.0 in Accession #:    [id number]             Weight:       210.5 lb Date of Birth:  01/15/1951               BSA:          2.178 m Patient Age:    61 years               BP:           123/90 mmHg Patient Gender: F                      HR:  79 bpm. Exam Location:  [facility] Procedure: 2D Echo, Cardiac Doppler and Color Doppler Indications:     Endocarditis I38  History:         Patient has prior history of Echocardiogram examinations, most                  recent 03/23/2022. COPD; Risk Factors:Hypertension.  Sonographer:     Sherrie Sport Referring Phys:  [id number] Tsosie Billing Diagnosing Phys: Kate Sable MD  Sonographer Comments: Technically challenging study due to limited acoustic windows and no apical window. Image acquisition challenging due to patient behavioral factors. IMPRESSIONS  1. Left ventricular ejection fraction, by estimation, is 50 to 55%. The left ventricle has low normal function. The left ventricle has no regional wall motion abnormalities. There is mild left ventricular hypertrophy. Left ventricular diastolic function  could not be evaluated.  2. Right ventricular systolic function is normal. The right ventricular size is not well visualized.  3. The mitral valve is degenerative. No evidence of mitral valve regurgitation.  4. The aortic valve is tricuspid. Aortic valve regurgitation is not visualized. Aortic valve sclerosis is present, with no evidence of aortic valve stenosis. Conclusion(s)/Recommendation(s): No evidence of valvular vegetations on this transthoracic echocardiogram. Consider a transesophageal echocardiogram to exclude infective endocarditis if clinically indicated. FINDINGS  Left Ventricle: Left ventricular ejection fraction, by estimation, is 50 to 55%. The left ventricle has low normal function. The left ventricle has no regional wall motion abnormalities. The left ventricular internal cavity size was normal in size.  There is mild left ventricular hypertrophy. Left ventricular diastolic function could not be evaluated. Right Ventricle: The right ventricular size is not well visualized. No increase in right ventricular wall thickness. Right ventricular systolic function is normal. Left Atrium: Left atrial size was normal in size. Right Atrium: Right atrial size was not well visualized. Pericardium: There is no evidence of pericardial effusion. Mitral Valve: The mitral valve is degenerative in appearance. There is mild thickening of the mitral valve leaflet(s). Mild to moderate mitral annular calcification. No evidence of mitral valve regurgitation. Tricuspid Valve: The tricuspid valve is grossly normal. Tricuspid valve regurgitation is not demonstrated. Aortic Valve: The aortic valve is tricuspid. Aortic valve regurgitation is not visualized. Aortic valve sclerosis is present, with no evidence of aortic valve stenosis. Pulmonic Valve: The pulmonic valve was not well visualized. Pulmonic valve regurgitation is not visualized. Aorta: The aortic root is normal in size and structure. Venous: The inferior vena cava was not well visualized. IAS/Shunts: The interatrial septum was not well visualized.  LEFT VENTRICLE PLAX 2D LVIDd:         5.10 cm LVIDs:         3.70 cm LV PW:         1.40 cm LV IVS:        1.20 cm LVOT diam:     2.00 cm LVOT Area:     3.14 cm  LEFT ATRIUM         Index LA diam:    3.40 cm 1.56 cm/m   AORTA Ao Root diam: 2.90 cm TRICUSPID VALVE TR Peak grad:   11.6 mmHg TR Vmax:        170.00 cm/s  SHUNTS Systemic Diam: 2.00 cm Kate Sable MD Electronically signed by Kate Sable MD Signature Date/Time: 10/19/2022/10:37:33 AM    Final      Medications:    anticoagulant sodium citrate      ceFAZolin (ANCEF) IV      (  feeding supplement) PROSource Plus  30 mL Oral BID BM   atorvastatin  80 mg Oral QHS   Chlorhexidine Gluconate Cloth  6 each Topical Q0600   Chlorhexidine Gluconate Cloth  6 each Topical  Q0600   feeding supplement (NEPRO CARB STEADY)  237 mL Oral TID BM   gabapentin  100 mg Oral BID   heparin injection (subcutaneous)  5,000 Units Subcutaneous Q8H   insulin aspart  0-9 Units Subcutaneous TID WC   levETIRAcetam  1,000 mg Oral Daily   levETIRAcetam  250 mg Oral Q M,W,F   losartan  100 mg Oral Daily   metoprolol succinate  100 mg Oral Daily   multivitamin  1 tablet Oral QHS   nystatin  5 mL Oral QID   pantoprazole  20 mg Oral BID   sertraline  25 mg Oral Daily   sodium chloride flush  10-40 mL Intracatheter Q12H   umeclidinium-vilanterol  1 puff Inhalation Daily   acetaminophen **OR** acetaminophen, alteplase, anticoagulant sodium citrate, heparin, hydrALAZINE, lidocaine (PF), lidocaine-prilocaine, pentafluoroprop-tetrafluoroeth, senna-docusate, sodium chloride flush, traMADol  Assessment/ Plan:  Ms. Taneya Valois is a 72 y.o.  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 She presents to the ED with worsening lower extremity pain. Patient will be admitted for Back pain [M54.9] Bilateral leg pain [M79.604, M79.605]  [facility]/MWF/Lt AVG /102.5kg  End-stage renal disease on hemodialysis.  Will maintain outpatient schedule if possible.   Attempted dialysis today however patient continues to have agitation.  Treatment terminated prior to initiation due to safety concerns. Continue to recommend palliative care consult to determine goals of care based on patient's overall health picture and inability to receive scheduled dialysis. Patient's last dialysis treatment was on Friday.  2. Anemia of chronic kidney disease Normocytic Lab Results  Component Value Date   HGB 9.6 (L) 10/19/2022  Patient receives Mircera at outpatient clinic.  Hemoglobin appears stable.  3. Secondary Hyperparathyroidism: with outpatient labs: PTH 180  (3/11), phosphorus 5.4, calcium 9.3   Lab Results  Component Value Date   PTH 131 (H) 03/14/2018   CALCIUM 10.2 10/19/2022   CAION 1.09 (L) 08/28/2022   PHOS 5.4 (H) 09/09/2022  Calcium and phosphorus within desired range.  4.  Hypertension with chronic kidney disease.  Home regimen includes losartan, metoprolol, and torsemide.  Prescribed losartan and metoprolol.   Blood pressure acceptable for this patient, 113/49.  5. Diabetes mellitus type II with chronic kidney disease/renal manifestations: insulin dependent. Home regimen includes Novolin 70/30. Most recent hemoglobin A1c is 5.8 on 03/23/22.   Sliding scale insulin managed by primary team.  6. Vertebral osteomyelitis, seen on CT lumber spine. Neurosurgery feels patient is not a surgical candidate Recommend antibiotics. ID following. CT guided aspiration of psoas abscess on 10/13/22.   Cefepime and vancomycin discontinued due to mentation concerns.  Patient currently receiving cefazolin only.    LOS: 7   3/27/202411:19 AM

## 2022-10-19 NOTE — Progress Notes (Signed)
Date of Admission:  10/12/2022      ID: Cynthia Dean is a 72 y.o. 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  She had been to the ED and admitted many times after that Oct 2023- Back pain and rt sciatica- MRI lumbar spine done on 05/11/22 Degenerative disc disease.  Pt did not want to pursue chemo or radiation for the breast cancer ( ER-, PR 20%, HER 2 positive)  She was hospitalized in Feb 2024 for severe anemia -Hb 5.4- GI work up negative except for diverticulosis ( not bleeding on the day of the scope)  Back again with back pain and b/l radiation of pain down the leg  MRI this time showed b/l lumbar abscess, L4-L5 discitis, b/l psoas abscess  Aspiration of the rt psoas abscess on 10/13/22 is positive for staph capitis   Partner at bed side Subjective: More calm But confused  Medications:   (feeding supplement) PROSource Plus  30 mL Oral BID BM   acetaminophen  1,000 mg Oral Q8H   atorvastatin  80 mg Oral QHS   Chlorhexidine Gluconate Cloth  6 each Topical Q0600   Chlorhexidine Gluconate Cloth  6 each Topical Q0600   feeding supplement (NEPRO  CARB STEADY)  237 mL Oral TID BM   gabapentin  100 mg Oral BID   heparin injection (subcutaneous)  5,000 Units Subcutaneous Q8H   insulin aspart  0-9 Units Subcutaneous TID WC   levETIRAcetam  1,000 mg Oral Daily   levETIRAcetam  250 mg Oral Q M,W,F   [START ON 10/20/2022] LORazepam  1 mg Oral Once per day on Mon Wed Fri   [START ON 10/20/2022] LORazepam  1 mg Oral Once   losartan  100 mg Oral Daily   metoprolol succinate  100 mg Oral Daily   multivitamin  1 tablet Oral QHS   nystatin  5 mL Oral QID   pantoprazole  20 mg Oral BID   sertraline  25 mg Oral Daily   sodium chloride flush  10-40 mL Intracatheter Q12H   umeclidinium-vilanterol  1 puff Inhalation Daily    Objective: Vital signs in last 24 hours: Patient Vitals for the past 24 hrs:  BP Temp Temp src Pulse Resp SpO2 Weight  10/19/22 0943 (!) 113/49 98.6 F (37 C) Oral 82 15 98 % 94.7 kg  10/19/22 0754 133/65 98.8 F (37.1 C) -- 65 16 98 % --  10/19/22 0430 -- -- -- 79 16 97 % --  10/18/22 2139 (!) 123/90 98.4 F (36.9 C) -- 85 18 92 % --       PHYSICAL EXAM:  General: awake, calm Lungs: b/l air entry. Heart: s1s2 Abdomen:  Soft, Extremities: left AVG site -okay Skin: No rashes or lesions. Or bruising  CNS rt lower extremity movement less compared to the rest  Lab Results    Latest Ref Rng & Units 10/19/2022    6:00 AM 10/18/2022    4:53 AM 10/17/2022   12:17 PM  CBC  WBC 4.0 - 10.5 K/uL 7.5  8.1  8.9   Hemoglobin 12.0 - 15.0 g/dL 9.6  9.7  9.3   Hematocrit 36.0 - 46.0 % 33.4  33.9  32.2   Platelets 150 - 400 K/uL 186  215  225        Latest Ref Rng & Units 10/19/2022    6:00 AM 10/18/2022    4:53 AM 10/17/2022   12:17 PM  CMP  Glucose 70 - 99 mg/dL 128  124  148   BUN 8 - 23 mg/dL 47  39  35   Creatinine 0.44 - 1.00 mg/dL 10.85  9.48  8.18   Sodium 135 - 145 mmol/L 142  140  140   Potassium 3.5 - 5.1 mmol/L 3.6  3.4  3.4   Chloride 98 - 111 mmol/L 104  103  100   CO2 22 - 32 mmol/L 24  25  27     Calcium 8.9 - 10.3 mg/dL 10.2  9.9  10.2     Erythrocyte Sedimentation Rate     Component Value Date/Time   ESRSEDRATE 70 (H) 10/13/2022 0006      Microbiology: 10/12/22 BC- NG so far 3/21 psoas abscess   Studies/Results: ECHOCARDIOGRAM COMPLETE  Result Date: 10/19/2022    ECHOCARDIOGRAM REPORT   Patient Name:   Cynthia Dean Date of Exam: 10/19/2022 Medical Rec #:  [id number]              Height:       72.0 in Accession #:    [id number]             Weight:       210.5 lb Date of Birth:  11-21-1950               BSA:          2.178 m Patient Age:    59 years               BP:           123/90 mmHg Patient Gender: F                      HR:           79 bpm. Exam Location:  [facility] Procedure: 2D Echo, Cardiac Doppler and Color Doppler Indications:     Endocarditis I38  History:         Patient has prior history of Echocardiogram examinations, most                  recent 03/23/2022. COPD; Risk Factors:Hypertension.  Sonographer:     Sherrie Sport Referring Phys:  [id number] Tsosie Billing Diagnosing Phys: Kate Sable MD  Sonographer Comments: Technically challenging study due to limited acoustic windows and no apical window. Image acquisition challenging due to patient behavioral factors. IMPRESSIONS  1. Left ventricular ejection fraction, by estimation, is 50 to 55%. The left ventricle has low normal function. The left ventricle has no regional wall motion abnormalities. There is mild left ventricular hypertrophy. Left ventricular diastolic function  could not be evaluated.  2. Right ventricular systolic function is normal. The  right ventricular size is not well visualized.  3. The mitral valve is degenerative. No evidence of mitral valve regurgitation.  4. The aortic valve is tricuspid. Aortic valve regurgitation is not visualized. Aortic valve sclerosis is present, with no evidence of aortic valve stenosis. Conclusion(s)/Recommendation(s): No evidence of valvular vegetations on this  transthoracic echocardiogram. Consider a transesophageal echocardiogram to exclude infective endocarditis if clinically indicated. FINDINGS  Left Ventricle: Left ventricular ejection fraction, by estimation, is 50 to 55%. The left ventricle has low normal function. The left ventricle has no regional wall motion abnormalities. The left ventricular internal cavity size was normal in size. There is mild left ventricular hypertrophy. Left ventricular diastolic function could not be evaluated. Right Ventricle: The right ventricular size is not well visualized. No increase in right ventricular wall thickness. Right ventricular systolic function is normal. Left Atrium: Left atrial size was normal in size. Right Atrium: Right atrial size was not well visualized. Pericardium: There is no evidence of pericardial effusion. Mitral Valve: The mitral valve is degenerative in appearance. There is mild thickening of the mitral valve leaflet(s). Mild to moderate mitral annular calcification. No evidence of mitral valve regurgitation. Tricuspid Valve: The tricuspid valve is grossly normal. Tricuspid valve regurgitation is not demonstrated. Aortic Valve: The aortic valve is tricuspid. Aortic valve regurgitation is not visualized. Aortic valve sclerosis is present, with no evidence of aortic valve stenosis. Pulmonic Valve: The pulmonic valve was not well visualized. Pulmonic valve regurgitation is not visualized. Aorta: The aortic root is normal in size and structure. Venous: The inferior vena cava was not well visualized. IAS/Shunts: The interatrial septum was not well visualized.  LEFT VENTRICLE PLAX 2D LVIDd:         5.10 cm LVIDs:         3.70 cm LV PW:         1.40 cm LV IVS:        1.20 cm LVOT diam:     2.00 cm LVOT Area:     3.14 cm  LEFT ATRIUM         Index LA diam:    3.40 cm 1.56 cm/m   AORTA Ao Root diam: 2.90 cm TRICUSPID VALVE TR Peak grad:   11.6 mmHg TR Vmax:        170.00 cm/s  SHUNTS Systemic Diam: 2.00 cm Kate Sable MD Electronically signed by Kate Sable MD Signature Date/Time: 10/19/2022/10:37:33 AM    Final      Assessment/Plan:  Lumbar discitis,L4-L5  epidural phlegmon and b/l psoas abscess on MRI Underwent IR aspiration of psoas abscess on 10/13/22 Vanco and cefepime stated on 10/13/22 after procedure Gram stain numerous wbc no organism Culture positive for staph capitis a coag neg bacteria Blood culture ng On 03/19/22 she had 4/4 blood culture positive for staph capitis At that time she had left breast swelling , induration initially thought to be an abscess post lumpectomy on 03/04/22 for malignancy. US done showed complicated cystic lesion measuring 14.5 x 6.3 x 9.2 cm. Left breast. IR aspirated 135 ml of old blood from the left breast and culture was negative She was then treated with a total of 2 weeks of appropriate antibiotic for the pan sensitive staph capitis ( Iv ceftriaxone, vanco and then cefazolin) The 2 staph capitis have slightly different susceptibility pattern- the one in Aug from blood was pan sensitive and the one from the abscess has resistance to clinda and erythromycin  Is the lumbar infection indolent from Aug bacteremia,  or is it from a new infection because she is at risk for one due to ESRD, being on dialysis and having DDD Or did she have endocarditis and seeded the spine. Last echo from 03/22/22 did not show any valve vegetations. Blood culture this time negative Will repeat 2 d echo   Will now need 6 weeks- 8 weeks of IV antibiotic On cefazolin  Confusion/agitation-  Could be medication- Opioids/cefepime Sh refused  dialysis again today ? MRI brain   ESRD on dialysis Recent malfunctioning AVG and underwent fistulogram, angioplasty and stent placement on 09/22/22 Recommend tagged WBC but patient is agitated and may not be able to lay still for the test     Left breast Carcinoma- s/p  lumpectomy and targeted axillary dissection She did not want to  take chemo/radiation    Anemia   DM -    HTN on metoprolol  H/o Pulmonary embolism H/o CVA  Discussed the management with care team

## 2022-10-19 NOTE — Progress Notes (Signed)
Physical Therapy Treatment Patient Details Name: Cynthia Dean MRN: [id number] DOB: August 24, 1950 Today's Date: 10/19/2022   History of Present Illness Cynthia Dean is a 72 y.o. 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 She presents to the ED with worsening lower extremity pain.    PT Comments    Pt received in Semi-Fowler's position and with safety mittens donned on B UEs.   Pt very Cynthia initially upon arrival, and attempted to participate in therex, for which she was able to do a few bouts of exercises before requesting the mittens to come off.  Family in room with pt and stated she had been active with hands and requesting them to come off frequently.  Therapist attempted to guide pt through more exercises, however pt deferred and unable to focus for long enough to perform the tasks.  Pt also deferred getting out of bed as well.  Pt left in care with family members and call bell within reach.     Recommendations for follow up therapy are one component of a multi-disciplinary discharge planning process, led by the attending physician.  Recommendations may be updated based on patient status, additional functional criteria and insurance authorization.  Follow Up Recommendations  Can patient physically be transported by private vehicle: No    Assistance Recommended at Discharge Frequent or constant Supervision/Assistance  Patient can return home with the following A lot of help with walking and/or transfers;A lot of help with bathing/dressing/bathroom;Assistance with cooking/housework;Assist for transportation;Help with stairs or ramp for entrance   Equipment Recommendations       Recommendations for Other Services       Precautions / Restrictions Precautions Precautions: Fall Restrictions Weight Bearing Restrictions: No      Mobility  Bed Mobility               General bed mobility comments: deferred due to pt's cognitive ability to assist with transfer.    Transfers                        Ambulation/Gait                   Stairs             Wheelchair Mobility    Modified Rankin (Stroke Patients Only)       Balance       Sitting balance - Comments: not assessed due to pt's cognitive status this PM.                                    Cognition                                                Exercises Total Joint Exercises Ankle Circles/Pumps: AROM, Strengthening, Both, 10 reps, Supine Quad Sets: AROM, Strengthening, Both, 10 reps, Supine    General Comments        Pertinent Vitals/Pain Pain Assessment Pain Assessment: Faces Faces Pain Scale: No hurt Pain Intervention(s): Limited activity within patient's tolerance, Monitored during session    Home Living  Prior Function            PT Goals (current goals can now be found in the care plan section) Acute Rehab PT Goals PT Goal Formulation: Patient unable to participate in goal setting Progress towards PT goals: Not progressing toward goals - comment    Frequency    Min 2X/week      PT Plan Current plan remains appropriate    Co-evaluation              AM-PAC PT "6 Clicks" Mobility   Outcome Measure  Help needed turning from your back to your side while in a flat bed without using bedrails?: A Little Help needed moving from lying on your back to sitting on the side of a flat bed without using bedrails?: A Little Help needed moving to and from a bed to a chair (including a wheelchair)?: Total Help needed standing up from a chair using your arms (e.g., wheelchair or bedside chair)?: Total Help needed to walk in hospital room?: Total Help needed climbing 3-5 steps with a railing? : Total 6 Click Score:  10    End of Session   Activity Tolerance: Patient tolerated treatment well Patient left: in bed;with call bell/phone within reach;with bed alarm set;with family/visitor present Nurse Communication: Mobility status PT Visit Diagnosis: Unsteadiness on feet (R26.81);Other abnormalities of gait and mobility (R26.89);Muscle weakness (generalized) (M62.81);Difficulty in walking, not elsewhere classified (R26.2)     Time: [id number] PT Time Calculation (min) (ACUTE ONLY): 17 min  Charges:  $Therapeutic Exercise: 8-22 mins                     Gwenlyn Saran, PT, DPT Physical Therapist - [facility]  10/19/22, 4:27 PM

## 2022-10-19 NOTE — Progress Notes (Signed)
  PROGRESS NOTE    Cynthia Dean  [id number] DOB: [birth year] DOA: 10/12/2022 PCP: Lowella Bandy, MD  [id number]  LOS: 7 days   Brief hospital course:   Assessment & Plan: 72 y/o F w/ PMH of ESRD on HD, HLD, COPD, DM2, HTN, depression, CVA who presents w/ back pain x 3 weeks.   Pt presented w/ back pain and was found to intradiscal abscess/lumbar discitis/b/l psoas abscess on MRI. Pt was initially put on IV vanco & cefepime. Abxs changed to IV cefazolin & vanco was continued as per ID. Wound cx growing staph capitis. Will need 6-8 weeks of IV abxs as per ID.  Not a good surgical candidate as per neuro surg. Of note, pt was been very confused/agitated and pt even refused HD on 10/17/22 & 10/17/32.   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  ID consulted.   --cont cefepime --change IV vanc to cefazolin --Will now need 6 weeks- 8 weeks of IV antibiotic    Confusion and agitation Hospital delirium   ESRD: on HD MWF.  Pt refused HD on 10/17/22, 10/18/22 and 3/27. --palliative consult today --pre-medicate prior to HD   Generalized weakness: PT recs SNF    ACD: likely secondary ESRD. H&H are stable    Hypokalemia: managed w/ HD    Hx of seizures: continue on home dose of keppra   HLD: continue on statin    Hx of COPD: w/o exacerbation. Continue on bronchodilators    DM2: well controlled, HbA1c 5.2.  --d/c BG checks   Peripheral neuropathy: likely secondary to DM2. Continue on home dose of gabapentin    HTN:  --cont losartan and Toprol   GERD: continue on PPI    Depression: severity unknown. Continue on home dose of sertraline     Overweight: BMI 28.2. Would benefit from weight loss    DVT prophylaxis: Heparin SQ Code Status: Full code  Family Communication:  Level of care: Telemetry Medical Dispo:   The patient is  from: home Anticipated d/c is to: undetermined Anticipated d/c date is: undetermined   Subjective and Interval History:  Pt was not answering questions.  RN reported pt confused.  Pt again refused dialysis.  Palliative consult today.   Objective: Vitals:   10/19/22 0430 10/19/22 0754 10/19/22 0943 10/19/22 2019  BP:  133/65 (!) 113/49 118/60  Pulse: 79 65 82 79  Resp: 16 16 15 18   Temp:  98.8 F (37.1 C) 98.6 F (37 C) 98.2 F (36.8 C)  TempSrc:   Oral Oral  SpO2: 97% 98% 98% 100%  Weight:   94.7 kg   Height:       No intake or output data in the 24 hours ending 10/19/22 2054 Filed Weights   10/14/22 1159 10/17/22 1444 10/19/22 0943  Weight: 95.4 kg 95.5 kg 94.7 kg    Examination:   Constitutional: NAD, alert, not oriented HEENT: conjunctivae and lids normal, EOMI CV: No cyanosis.   RESP: normal respiratory effort, on 2L SKIN: warm, dry   Data Reviewed: I have personally reviewed labs and imaging studies  Time spent: 50 minutes  Enzo Bi, MD [facility] If 7PM-7AM, please contact night-coverage 10/19/2022, 8:54 PM

## 2022-10-19 NOTE — Consult Note (Signed)
Consultation Note Date: 10/19/2022   Patient Name: Cynthia Dean  DOB: August 05, 1950  MRN: [id number]  Age / Sex: 72 y.o., female  PCP: Cynthia Bandy, MD Referring Physician: Enzo Bi, MD  Reason for Consultation: Establishing goals of care  HPI/Patient Profile: 72 y.o. female   admitted on 3/20/2024with c/o back pain.    Past  medical history significant for  diabetes mellitus, COPD, end-stage renal disease on dialysis.  According to admission notes daughter reported that she has been having worsening back pain over the past several weeks. She has been having trouble walking for longer than that and has been essentially nonambulatory for weeks to months. She is able to take single steps at times. She is having significant pain down her legs. She is able to feel her perineum. She denies numbness in the legs.   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  This is consistent with her exam, which shows worsening ability to move her right leg versus her left.  She has not walked in at least 3 weeks but possibly longer.  She does not have cauda equina dysfunction at this time.   She is a poor surgical candidate given her multiple medical problems.  Additionally, she has not walked in at least 3 weeks per her daughter.  As such, recovery of ambulation may not be achievable with surgical intervention.  I do not think surgery is likely to improve her outcome in this situation.   - Antibiotics per ID - IR for aspiration - PTOT when able  Currently patient is without medical capacity.  Family face treatment option decision, advanced directive decisions and  anticipatory care needs.         Clinical Assessment and Goals of Care:  This NP Cynthia Dean reviewed medical records, received report from team, assessed the patient and then meet at the patient's bedside along with her daughter Cynthia Dean to discuss diagnosis, prognosis, [place], EOL wishes disposition and options.   Concept of Palliative Care was introduced as specialized medical care for people and their families living with serious illness.  If focuses on providing relief from the symptoms and stress of a serious illness.  The goal is to improve quality of life for both the patient and the family.  Created space and opportunity for patient  and family to explore thoughts and feelings regarding current medical situation     A  discussion was had today regarding advanced directives.  Concepts specific to code status, artifical feeding and hydration, continued IV antibiotics and rehospitalization was had.  The difference between a aggressive medical intervention path  and a palliative comfort care path for this patient at this time was had.  Values and goals of care important to patient and family were attempted to be elicited.  MOST form    Natural trajectory and expectations at EOL were discussed.  Questions and concerns addressed.  Patient  encouraged to call with questions or concerns.     PMT will continue to support holistically.          No documented   {Primary Decision WM:[id number]    SUMMARY OF RECOMMENDATIONS    Code Status/Advance Care Planning: {Palliative Code status:23503}   Symptom Management:  ***  Palliative Prophylaxis:  {Palliative Prophylaxis:21015}  Additional Recommendations (Limitations, Scope, Preferences): {Recommended Scope and Preferences:21019}  Psycho-social/Spiritual:  Desire for further Chaplaincy support:{YES NO:22349} Additional Recommendations: {PAL SOCIAL:21064}  Prognosis:  {Palliative Care Prognosis:23504}  Discharge  Planning: {Palliative dispostion:23505}      Primary Diagnoses: Present on Admission:  Back pain  Anemia  Dyslipidemia  Anemia of chronic disease  Diabetic neuropathy (HCC)  Diabetic retinopathy without macular edema associated with type 2 diabetes mellitus ([place])  Essential hypertension  GERD (gastroesophageal reflux disease)   I have reviewed the medical record, interviewed the patient and family, and examined the patient. The following aspects are pertinent.  Past Medical History:  Diagnosis Date   Acute on chronic respiratory failure with hypoxia ([place]) 07/30/2019   Acute respiratory failure with hypoxia ([place]) 12/31/2018   Carotid arterial disease ([place])    a. 02/2018 < bilat ICA stenosis.   COPD (chronic obstructive pulmonary disease) (HCC)    Coronary artery disease    a. 11/2015 MV: EF 57%, no ischemia/infarct.   Cryptogenic stroke ([place])    a. 10/2017 s/p implantable loop recorder (no Afib to date).   Diabetes mellitus without complication ([place])    Diarrhea    ESRD on dialysis [facility])    GI bleed    a. 01/2019 EGD/Colonoscopy: Mild gastritis. Colitis.   Heart murmur    a. 12/2018 Echo: EF 55-60%, impaired relaxation. Mod dil LA. Mildly dil RA. Mild Ca2+ of AoV.   Hematemesis 06/27/2017   History of 2019 novel coronavirus disease (COVID-19) 07/30/2019   Hyperkalemia    Hyperlipidemia    Hypertension    Intractable nausea and vomiting 08/08/2015   Nausea & vomiting 05/30/2018   Nausea and vomiting 05/29/2018   Pneumonia due to COVID-19 virus    Social History   Socioeconomic History   Marital status: Single    Spouse name: Not on file   Number of children: 5   Years of education: Not on file   Highest education level: Not on file  Occupational History   Occupation: Disability   Tobacco Use   Smoking status: Former    Packs/day: 0    Types: Cigarettes   Smokeless tobacco: Never  Vaping Use   Vaping Use: Never used  Substance and Sexual Activity   Alcohol use: No    Drug use: No   Sexual activity: Not Currently  Other Topics Concern   Not on file  Social History Narrative   Live with Fiance   Social Determinants of Health   Financial Resource Strain: Low Risk  (11/12/2017)   Overall Financial Resource Strain (CARDIA)    Difficulty of Paying Living Expenses: Not hard at all  Food Insecurity: No Food Insecurity (10/12/2022)   Hunger Vital Sign    Worried About Running Out of Food in the Last Year: Never true    Ran Out of Food in the Last Year: Never true  Transportation Needs: No Transportation Needs (10/12/2022)   PRAPARE - Transportation    Lack of Transportation (Medical): No    Lack  of Transportation (Non-Medical): No  Physical Activity: Insufficiently Active (11/12/2017)   Exercise Vital Sign    Days of Exercise per Week: 5 days    Minutes of Exercise per Session: 20 min  Stress: No Stress Concern Present (11/12/2017)   [place]    Feeling of Stress : Not at all  Social Connections: Moderately Integrated (11/12/2017)   Social Connection and Isolation Panel [NHANES]    Frequency of Communication with Friends and Family: Twice a week    Frequency of Social Gatherings with Friends and Family: Twice a week    Attends Religious Services: More than 4 times per year    Active Member of [employer] or Organizations: Yes    Attends Music therapist: More than 4 times per year    Marital Status: Never married   Family History  Problem Relation Age of Onset   Breast cancer Father 84   Stroke Mother    Heart attack Mother    Heart Problems Sister    Breast cancer Cousin 68       1 st cousin. Maternal    Scheduled Meds:  (feeding supplement) PROSource Plus  30 mL Oral BID BM   atorvastatin  80 mg Oral QHS   Chlorhexidine Gluconate Cloth  6 each Topical Q0600   Chlorhexidine Gluconate Cloth  6 each Topical Q0600   feeding supplement (NEPRO CARB STEADY)  237 mL Oral TID  BM   gabapentin  100 mg Oral BID   heparin injection (subcutaneous)  5,000 Units Subcutaneous Q8H   insulin aspart  0-9 Units Subcutaneous TID WC   levETIRAcetam  1,000 mg Oral Daily   levETIRAcetam  250 mg Oral Q M,W,F   losartan  100 mg Oral Daily   metoprolol succinate  100 mg Oral Daily   multivitamin  1 tablet Oral QHS   nystatin  5 mL Oral QID   pantoprazole  20 mg Oral BID   sertraline  25 mg Oral Daily   sodium chloride flush  10-40 mL Intracatheter Q12H   umeclidinium-vilanterol  1 puff Inhalation Daily   Continuous Infusions:  anticoagulant sodium citrate      ceFAZolin (ANCEF) IV     PRN Meds:.acetaminophen **OR** acetaminophen, alteplase, anticoagulant sodium citrate, heparin, hydrALAZINE, lidocaine (PF), lidocaine-prilocaine, pentafluoroprop-tetrafluoroeth, senna-docusate, sodium chloride flush, traMADol Medications Prior to Admission:  Prior to Admission medications   Medication Sig Start Date End Date Taking? Authorizing Provider  acetaminophen (TYLENOL) 500 MG tablet Take 1 tablet (500 mg total) by mouth every 6 (six) hours as needed. 07/01/22  Yes Naaman Plummer, MD  aspirin EC 81 MG tablet Take 1 tablet (81 mg total) by mouth daily. Swallow whole. 07/01/22  Yes Naaman Plummer, MD  atorvastatin (LIPITOR) 80 MG tablet Take 1 tablet (80 mg total) by mouth every evening. 07/01/22 10/12/22 Yes Bradler, Vista Lawman, MD  BREO ELLIPTA 100-25 MCG/ACT AEPB Inhale 1 puff into the lungs daily.   Yes [provider]  cinacalcet (SENSIPAR) 30 MG tablet Take 1 tablet (30 mg total) by mouth daily with breakfast. 07/01/22  Yes Bradler, Vista Lawman, MD  cyclobenzaprine (FLEXERIL) 10 MG tablet Take 1 tablet (10 mg total) by mouth 3 (three) times daily as needed for muscle spasms. 10/06/22  Yes Naaman Plummer, MD  diclofenac Sodium (VOLTAREN) 1 % GEL Apply 2 g topically 4 (four) times daily. 09/27/22  Yes [provider]  diphenhydrAMINE (BENADRYL) 25 MG tablet Take  25 mg by mouth at  bedtime as needed. 09/28/22  Yes [provider]  diphenoxylate-atropine (LOMOTIL) 2.5-0.025 MG tablet SMARTSIG:1 Tablet(s) By Mouth Every 12 Hours PRN 07/01/22  Yes Naaman Plummer, MD  epoetin alfa (EPOGEN) 10000 UNIT/ML injection Inject 1 mL (10,000 Units total) into the vein every Monday, Wednesday, and Friday with hemodialysis. 09/07/22  Yes Emeterio Reeve, DO  fluticasone (FLONASE) 50 MCG/ACT nasal spray Place 1 spray into both nostrils daily. 07/01/22 10/12/22 Yes Naaman Plummer, MD  gabapentin (NEURONTIN) 100 MG capsule Take 1 capsule (100 mg total) by mouth 2 (two) times daily. Additional capsule as needed once daily for pain, total maximum daily dose 3 capsules (300 mg) 09/03/22  Yes Emeterio Reeve, DO  glucose 4 GM chewable tablet Chew 1 tablet (4 g total) by mouth as needed for low blood sugar. 07/01/22  Yes Naaman Plummer, MD  insulin NPH-regular Human (NOVOLIN 70/30) (70-30) 100 UNIT/ML injection Inject 28 Units into the skin 2 (two) times daily with a meal. 07/01/22  Yes Bradler, Vista Lawman, MD  levETIRAcetam (KEPPRA) 1000 MG tablet Take 1 tablet (1,000 mg total) by mouth daily. 07/01/22  Yes Naaman Plummer, MD  lidocaine (LIDODERM) 5 % Place 1 patch onto the skin daily. Remove & Discard patch within 12 hours or as directed by MD 07/01/22  Yes Naaman Plummer, MD  losartan (COZAAR) 100 MG tablet Take 1 tablet (100 mg total) by mouth at bedtime. 07/01/22 10/12/22 Yes Naaman Plummer, MD  metoprolol succinate (TOPROL-XL) 100 MG 24 hr tablet Take 1 tablet (100 mg total) by mouth daily. 07/01/22 10/12/22 Yes Naaman Plummer, MD  omeprazole (PRILOSEC) 20 MG capsule Take 1 capsule (20 mg total) by mouth 2 (two) times daily. 07/01/22 10/12/22 Yes Bradler, Vista Lawman, MD  polyethylene glycol (MIRALAX / GLYCOLAX) 17 g packet Take 17 g by mouth daily. 09/06/22  Yes Emeterio Reeve, DO  senna-docusate (SENOKOT-S) 8.6-50 MG tablet Take 2 tablets by mouth at bedtime as needed for mild constipation or  moderate constipation. 09/06/22  Yes Emeterio Reeve, DO  sertraline (ZOLOFT) 25 MG tablet Take 1 tablet (25 mg total) by mouth daily. 07/01/22 10/12/22 Yes Naaman Plummer, MD  torsemide (DEMADEX) 100 MG tablet Take 1 tablet (100 mg total) by mouth daily. 07/01/22 10/12/22 Yes Bradler, Vista Lawman, MD  traMADol (ULTRAM) 50 MG tablet Take 50 mg by mouth 2 (two) times daily. 09/28/22  Yes [provider]  traZODone (DESYREL) 50 MG tablet Take 1 tablet (50 mg total) by mouth at bedtime. 07/01/22 10/12/22 Yes Naaman Plummer, MD  vitamin D3 (CHOLECALCIFEROL) 25 MCG tablet Take 1 tablet (1,000 Units total) by mouth daily. 07/01/22  Yes Naaman Plummer, MD  levETIRAcetam (KEPPRA) 250 MG tablet Take 1 tablet (250 mg total) by mouth 3 (three) times a week. 07/01/22 08/26/22  Naaman Plummer, MD  umeclidinium-vilanterol (ANORO ELLIPTA) 62.5-25 MCG/ACT AEPB Inhale 1 puff into the lungs daily. Patient not taking: Reported on 10/13/2022 09/06/22   Emeterio Reeve, DO   Allergies  Allergen Reactions   Oxycodone Nausea And Vomiting   Oxycodone-Acetaminophen Other (See Comments)   Wound Dressing Adhesive    Hydrocodone     Intolerant more than allergic   Tape Itching    Skin Dermatitis/itching (tape adhesive) Skin Dermatitis/itching (tape adhesive)   Tapentadol Itching    Skin Dermatitis/itching (tape adhesive) Skin Dermatitis/itching (tape adhesive)    Review of Systems  Unable to perform ROS: Mental status change  Physical Exam  Vital Signs: BP (!) 113/49 (BP Location: Right Arm)   Pulse 82   Temp 98.6 F (37 C) (Oral)   Resp 15   Ht 6' (1.829 m)   Wt 94.7 kg   SpO2 98%   BMI 28.32 kg/m  Pain Scale: PAINAD   Pain Score: 2    SpO2: SpO2: 98 % O2 Device:SpO2: 98 % O2 Flow Rate: .O2 Flow Rate (L/min): 2 L/min  IO: Intake/output summary:  Intake/Output Summary (Last 24 hours) at 10/19/2022 1311 Last data filed at 10/18/2022 1845 Gross per 24 hour  Intake 0 ml  Output --  Net 0 ml     LBM: Last BM Date : 10/17/22 Baseline Weight: Weight: 113.4 kg Most recent weight: Weight: 94.7 kg     Palliative Assessment/Data:   Time:    Signed by: Cynthia Lessen, NP   Please contact Palliative Medicine Team phone at [phone] for questions and concerns.  For individual provider: See Shea Evans

## 2022-10-20 DIAGNOSIS — R410 Disorientation, unspecified: Secondary | ICD-10-CM | POA: Diagnosis not present

## 2022-10-20 DIAGNOSIS — Z992 Dependence on renal dialysis: Secondary | ICD-10-CM | POA: Diagnosis not present

## 2022-10-20 DIAGNOSIS — M4646 Discitis, unspecified, lumbar region: Secondary | ICD-10-CM | POA: Diagnosis not present

## 2022-10-20 DIAGNOSIS — M5442 Lumbago with sciatica, left side: Secondary | ICD-10-CM | POA: Diagnosis not present

## 2022-10-20 DIAGNOSIS — N186 End stage renal disease: Secondary | ICD-10-CM | POA: Diagnosis not present

## 2022-10-20 DIAGNOSIS — M5441 Lumbago with sciatica, right side: Secondary | ICD-10-CM | POA: Diagnosis not present

## 2022-10-20 LAB — CBC
HCT: 33.1 % — ABNORMAL LOW (ref 36.0–46.0)
Hemoglobin: 9.5 g/dL — ABNORMAL LOW (ref 12.0–15.0)
MCH: 23.8 pg — ABNORMAL LOW (ref 26.0–34.0)
MCHC: 28.7 g/dL — ABNORMAL LOW (ref 30.0–36.0)
MCV: 83 fL (ref 80.0–100.0)
Platelets: 177 10*3/uL (ref 150–400)
RBC: 3.99 MIL/uL (ref 3.87–5.11)
RDW: 21 % — ABNORMAL HIGH (ref 11.5–15.5)
WBC: 6.9 10*3/uL (ref 4.0–10.5)
nRBC: 0 % (ref 0.0–0.2)

## 2022-10-20 LAB — BASIC METABOLIC PANEL
Anion gap: 16 — ABNORMAL HIGH (ref 5–15)
BUN: 53 mg/dL — ABNORMAL HIGH (ref 8–23)
CO2: 23 mmol/L (ref 22–32)
Calcium: 9.9 mg/dL (ref 8.9–10.3)
Chloride: 103 mmol/L (ref 98–111)
Creatinine, Ser: 12.21 mg/dL — ABNORMAL HIGH (ref 0.44–1.00)
GFR, Estimated: 3 mL/min — ABNORMAL LOW (ref >60)
Glucose, Bld: 104 mg/dL — ABNORMAL HIGH (ref 70–99)
Potassium: 4.8 mmol/L (ref 3.5–5.1)
Sodium: 142 mmol/L (ref 135–145)

## 2022-10-20 LAB — GLUCOSE, CAPILLARY
Glucose-Capillary: 118 mg/dL — ABNORMAL HIGH (ref 70–99)
Glucose-Capillary: 154 mg/dL — ABNORMAL HIGH (ref 70–99)

## 2022-10-20 LAB — MAGNESIUM: Magnesium: 2.2 mg/dL (ref 1.7–2.4)

## 2022-10-20 MED ORDER — ACETAMINOPHEN 500 MG PO TABS: 1000.0000 mg | ORAL_TABLET | Freq: Three times a day (TID) | ORAL | Status: DC | PRN

## 2022-10-20 MED ORDER — METHOCARBAMOL 1000 MG/10ML IJ SOLN
500.0000 mg | Freq: Three times a day (TID) | INTRAVENOUS | Status: DC | PRN
  Administered 2022-10-20 – 2022-11-08 (×4): 500 mg via INTRAVENOUS
  Filled 2022-10-20 (×4): qty 500

## 2022-10-20 MED ORDER — LEVETIRACETAM 250 MG PO TABS
250.0000 mg | ORAL_TABLET | Freq: Once | ORAL | Status: AC
  Administered 2022-10-20: 250 mg via ORAL
  Filled 2022-10-20: qty 1

## 2022-10-20 MED ORDER — QUETIAPINE FUMARATE 25 MG PO TABS
50.0000 mg | ORAL_TABLET | Freq: Every day | ORAL | Status: DC
  Administered 2022-10-20 – 2022-11-10 (×22): 50 mg via ORAL
  Filled 2022-10-20 (×22): qty 2

## 2022-10-20 NOTE — Care Management Important Message (Signed)
Important Message  Patient Details  Name: Saylee Lord MRN: [id number] Date of Birth: January 14, 1951   Medicare Important Message Given:  Yes  Patient out of room upon time of visit for dialysis. Confirmed copy of Medicare IM in room for reference.   [name] 10/20/2022, 12:16 PM

## 2022-10-20 NOTE — Progress Notes (Signed)
Patient ID: Cynthia Dean, female   DOB: 02-10-1951, 72 y.o.   MRN: [id number]    Progress Note from the Palliative Medicine Team at [facility]   Patient Name: Cynthia Dean        Date: 10/20/2022 DOB: [birth year]  Age: 72 y.o. MRN#: [id number] Attending Physician: Enzo Bi, MD Primary Care Physician: Lowella Bandy, MD Admit Date: 10/12/2022   Medical records reviewed, assessed patient   72 y.o. female   admitted on 3/20/2024with c/o back pain.     Past  medical history significant for  diabetes mellitus, COPD, end-stage renal disease on dialysis.  According to admission notes daughter reported that she has been having worsening back pain over the past several weeks. She has been having trouble walking for longer than that and has been essentially nonambulatory for weeks to months. She is able to take single steps at times. She is having significant pain down her legs. She is able to feel her perineum. She denies numbness in the legs.    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  This is consistent with her exam, which shows worsening ability to move her right leg versus her left.  She has not walked in at least 3 weeks but possibly longer.  She does not have cauda equina dysfunction at this time.   She is a poor surgical candidate given her multiple medical problems.  Additionally, she has not walked in at least 3 weeks per her daughter.  As such, recovery of ambulation may not be achievable with surgical intervention.  I do not think surgery is likely to improve her outcome in this situation.   - Antibiotics per ID - IR for aspiration - PTOT when able   Currently patient is without medical capacity, confused and  agitated  Family face treatment option decision, advanced directive decisions and anticipatory care needs.   Today is day 8 of this hospitalization.     This NP assessed patient at the bedside as a follow up to  yesterday's GOCs meeting and to meet with daughter/Kim for continued conversation regarding current medical situation; treatment options decisions, advanced directive decisions and anticipatory care needs.  Kim stayed the night with her mother.  She tells me she rested better, less agitated and taking more offered foods and fluids.  Hope  today is that patient will tolerate her dialysis treatment, will try low dose of Ativan to decrease anxiety and agitation.  I verbalized my concern and education offered on the seriousness of patient's current medical situation and her high risk for decompensation.   Family understand and at this point they remain hopeful for improvement and return to baseline.  They are open to all offered and available medical  interventions to prolong life.  Education offered today regarding  the importance of continued conversation with family and their  medical providers regarding overall plan of care and treatment options,  ensuring decisions are within the context of the patients values and GOCs.  Questions and concerns addressed   Discussed with Dr Billie Ruddy and Dr Juleen China   PMT will continue to support holistically    Time:  50 minutes  Detailed review of medical records, medically appropriate exam,   discussed with treatment team, counseling  and education to patient, family, staff, documenting clinical information, medication management, coordination of care    Wadie Lessen NP  Palliative Medicine Team Team Phone # [phone] Pager [phone]

## 2022-10-20 NOTE — Progress Notes (Signed)
[facility]  ROUNDING NOTE   Subjective:   Cynthia Dean is a 72 y.o. 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 She presents to the ED with worsening lower extremity pain. Patient will be admitted for Back pain [M54.9] Bilateral leg pain [M79.604, M79.605]  Patient is known to our practice and receives outpatient dialysis treatments at [facility] on a MWF schedule, supervised by Dr Candiss Norse.   Patient seen during dialysis Drowsy but arousable to name  Objective:  Vital signs in last 24 hours:  Temp:  [97.6 F (36.4 C)-98.3 F (36.8 C)] 98.3 F (36.8 C) (03/28 0949) Pulse Rate:  [72-80] 77 (03/28 1216) Resp:  [12-22] 13 (03/28 1216) BP: (91-118)/(42-67) 107/51 (03/28 1216) SpO2:  [92 %-100 %] 100 % (03/28 1216) Weight:  [94.6 kg] 94.6 kg (03/28 0958)  Weight change:  Filed Weights   10/17/22 1444 10/19/22 0943 10/20/22 0958  Weight: 95.5 kg 94.7 kg 94.6 kg    Intake/Output: No intake/output data recorded.   Intake/Output this shift:  Total I/O In: 50 [IV Piggyback:50] Out: -   Physical Exam: General: NAD  Head: Normocephalic, atraumatic.  Dry oral mucosal membranes  Eyes: Anicteric  Lungs:  Clear to auscultation, normal effort  Heart: Regular rate and rhythm  Abdomen:  Soft, nontender  Extremities: Trace peripheral edema.  Neurologic: Somnolent, moving all four extremities  Skin: No lesions  Access: Left AVG    Basic Metabolic Panel: Recent Labs  Lab 10/16/22 0545 10/17/22 1217 10/18/22 0453 10/19/22 0600 10/20/22 0637  NA 136 140 140 142 142  K 3.3* 3.4* 3.4* 3.6 4.8  CL 103 100 103 104 103  CO2 27 27 25 24 23   GLUCOSE 172* 148* 124* 128* 104*  BUN 26* 35* 39* 47* 53*  CREATININE 6.31* 8.18* 9.48* 10.85* 12.21*  CALCIUM 9.9 10.2 9.9 10.2 9.9  MG  --   --   --   --  2.2     Liver  Function Tests: No results for input(s): "AST", "ALT", "ALKPHOS", "BILITOT", "PROT", "ALBUMIN" in the last 168 hours. No results for input(s): "LIPASE", "AMYLASE" in the last 168 hours. No results for input(s): "AMMONIA" in the last 168 hours.  CBC: Recent Labs  Lab 10/16/22 0545 10/17/22 1217 10/18/22 0453 10/19/22 0600 10/20/22 0637  WBC 9.4 8.9 8.1 7.5 6.9  HGB 9.5* 9.3* 9.7* 9.6* 9.5*  HCT 33.0* 32.2* 33.9* 33.4* 33.1*  MCV 82.5 82.8 82.7 82.7 83.0  PLT 200 225 215 186 177     Cardiac Enzymes: No results for input(s): "CKTOTAL", "CKMB", "CKMBINDEX", "TROPONINI" in the last 168 hours.  BNP: Invalid input(s): "POCBNP"  CBG: Recent Labs  Lab 10/19/22 0751 10/19/22 1140 10/19/22 1626 10/19/22 2120 10/20/22 0751  GLUCAP 123* 124* 100* 98 118*     Microbiology: Results for orders placed or performed during the hospital encounter of 10/12/22  Blood culture (routine x 2)     Status: None   Collection Time: 10/12/22  5:40 PM   Specimen: BLOOD  Result Value Ref Range Status   Specimen Description BLOOD BLOOD RIGHT HAND [facility]  Final   Special Requests   Final    BOTTLES DRAWN AEROBIC AND ANAEROBIC Blood Culture results may not be optimal due to an inadequate volume of blood received in culture bottles   Culture   Final    NO GROWTH 5 DAYS Performed at [facility], [address] [place], [place] [address]    Report Status 10/17/2022 FINAL  Final  Blood culture (routine x 2)     Status: None   Collection Time: 10/12/22  6:56 PM   Specimen: BLOOD  Result Value Ref Range Status   Specimen Description BLOOD BLOOD RIGHT FOREARM  Final   Special Requests   Final    BOTTLES DRAWN AEROBIC ONLY Blood Culture results may not be optimal due to an inadequate volume of blood received in culture bottles   Culture   Final    NO GROWTH 5 DAYS Performed at [facility], [address] [place], [place] [address]    Report Status 10/17/2022 FINAL  Final   Aerobic/Anaerobic Culture w Gram Stain (surgical/deep wound)     Status: None   Collection Time: 10/13/22 11:44 AM   Specimen: Abscess  Result Value Ref Range Status   Specimen Description   Final    ABSCESS Performed at [facility], [address] [place], [place] [address]    Special Requests   Final    MUSCLE ABSCESS Performed at [facility], [address] [place], [place] [address]    Gram Stain   Final    ABUNDANT WBC PRESENT, PREDOMINANTLY PMN NO ORGANISMS SEEN    Culture   Final    FEW STAPHYLOCOCCUS CAPITIS NO ANAEROBES ISOLATED Performed at [facility], [place] [address], [place],  [address]    Report Status 10/18/2022 FINAL  Final   Organism ID, Bacteria STAPHYLOCOCCUS CAPITIS  Final      Susceptibility   Staphylococcus capitis - MIC*    CIPROFLOXACIN <=0.5 SENSITIVE Sensitive     ERYTHROMYCIN >=8 RESISTANT Resistant     GENTAMICIN <=0.5 SENSITIVE Sensitive     OXACILLIN <=0.25 SENSITIVE Sensitive     TETRACYCLINE <=1 SENSITIVE Sensitive     VANCOMYCIN 1 SENSITIVE Sensitive     TRIMETH/SULFA <=10 SENSITIVE Sensitive     CLINDAMYCIN INTERMEDIATE Intermediate     RIFAMPIN <=0.5 SENSITIVE Sensitive     Inducible Clindamycin NEGATIVE Sensitive     * FEW STAPHYLOCOCCUS CAPITIS    Coagulation Studies: No results for input(s): "LABPROT", "INR" in the last 72 hours.  Urinalysis: No results for input(s): "COLORURINE", "LABSPEC", "PHURINE", "GLUCOSEU", "HGBUR", "BILIRUBINUR", "KETONESUR", "PROTEINUR", "UROBILINOGEN", "NITRITE", "LEUKOCYTESUR" in the last 72 hours.  Invalid input(s): "APPERANCEUR"    Imaging: ECHOCARDIOGRAM COMPLETE  Result Date: 10/19/2022    ECHOCARDIOGRAM REPORT   Patient Name:   Cynthia Dean Date of Exam: 10/19/2022 Medical Rec #:  [id number]              Height:       72.0 in Accession #:    [id number]             Weight:       210.5 lb Date of Birth:  [birth year]               BSA:          2.178 m  Patient Age:    81 years               BP:           123/90 mmHg Patient Gender: F                      HR:           79 bpm. Exam Location:  [facility] Procedure:  2D Echo, Cardiac Doppler and Color Doppler Indications:     Endocarditis I38  History:         Patient has prior history of Echocardiogram examinations, most                  recent 03/23/2022. COPD; Risk Factors:Hypertension.  Sonographer:     Sherrie Sport Referring Phys:  [id number] Tsosie Billing Diagnosing Phys: Kate Sable MD  Sonographer Comments: Technically challenging study due to limited acoustic windows and no apical window. Image acquisition challenging due to patient behavioral factors. IMPRESSIONS  1. Left ventricular ejection fraction, by estimation, is 50 to 55%. The left ventricle has low normal function. The left ventricle has no regional wall motion abnormalities. There is mild left ventricular hypertrophy. Left ventricular diastolic function  could not be evaluated.  2. Right ventricular systolic function is normal. The right ventricular size is not well visualized.  3. The mitral valve is degenerative. No evidence of mitral valve regurgitation.  4. The aortic valve is tricuspid. Aortic valve regurgitation is not visualized. Aortic valve sclerosis is present, with no evidence of aortic valve stenosis. Conclusion(s)/Recommendation(s): No evidence of valvular vegetations on this transthoracic echocardiogram. Consider a transesophageal echocardiogram to exclude infective endocarditis if clinically indicated. FINDINGS  Left Ventricle: Left ventricular ejection fraction, by estimation, is 50 to 55%. The left ventricle has low normal function. The left ventricle has no regional wall motion abnormalities. The left ventricular internal cavity size was normal in size. There is mild left ventricular hypertrophy. Left ventricular diastolic function could not be evaluated. Right Ventricle: The right ventricular size is not well visualized. No  increase in right ventricular wall thickness. Right ventricular systolic function is normal. Left Atrium: Left atrial size was normal in size. Right Atrium: Right atrial size was not well visualized. Pericardium: There is no evidence of pericardial effusion. Mitral Valve: The mitral valve is degenerative in appearance. There is mild thickening of the mitral valve leaflet(s). Mild to moderate mitral annular calcification. No evidence of mitral valve regurgitation. Tricuspid Valve: The tricuspid valve is grossly normal. Tricuspid valve regurgitation is not demonstrated. Aortic Valve: The aortic valve is tricuspid. Aortic valve regurgitation is not visualized. Aortic valve sclerosis is present, with no evidence of aortic valve stenosis. Pulmonic Valve: The pulmonic valve was not well visualized. Pulmonic valve regurgitation is not visualized. Aorta: The aortic root is normal in size and structure. Venous: The inferior vena cava was not well visualized. IAS/Shunts: The interatrial septum was not well visualized.  LEFT VENTRICLE PLAX 2D LVIDd:         5.10 cm LVIDs:         3.70 cm LV PW:         1.40 cm LV IVS:        1.20 cm LVOT diam:     2.00 cm LVOT Area:     3.14 cm  LEFT ATRIUM         Index LA diam:    3.40 cm 1.56 cm/m   AORTA Ao Root diam: 2.90 cm TRICUSPID VALVE TR Peak grad:   11.6 mmHg TR Vmax:        170.00 cm/s  SHUNTS Systemic Diam: 2.00 cm Kate Sable MD Electronically signed by Kate Sable MD Signature Date/Time: 10/19/2022/10:37:33 AM    Final      Medications:    anticoagulant sodium citrate      ceFAZolin (ANCEF) IV Stopped (10/20/22 0043)    (feeding supplement) PROSource Plus  30  mL Oral BID BM   acetaminophen  1,000 mg Oral Q8H   atorvastatin  80 mg Oral QHS   Chlorhexidine Gluconate Cloth  6 each Topical Q0600   Chlorhexidine Gluconate Cloth  6 each Topical Q0600   feeding supplement (NEPRO CARB STEADY)  237 mL Oral TID BM   gabapentin  100 mg Oral BID   heparin  injection (subcutaneous)  5,000 Units Subcutaneous Q8H   levETIRAcetam  1,000 mg Oral Daily   levETIRAcetam  250 mg Oral Q M,W,F   LORazepam  1 mg Oral Once per day on Mon Wed Fri   losartan  100 mg Oral Daily   metoprolol succinate  100 mg Oral Daily   multivitamin  1 tablet Oral QHS   nystatin  5 mL Oral QID   pantoprazole  20 mg Oral BID   sertraline  25 mg Oral Daily   sodium chloride flush  10-40 mL Intracatheter Q12H   umeclidinium-vilanterol  1 puff Inhalation Daily   alteplase, anticoagulant sodium citrate, heparin, hydrALAZINE, HYDROmorphone, lidocaine (PF), lidocaine-prilocaine, LORazepam, pentafluoroprop-tetrafluoroeth, senna-docusate, sodium chloride flush  Assessment/ Plan:  Cynthia Dean is a 72 y.o.  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 She presents to the ED with worsening lower extremity pain. Patient will be admitted for Back pain [M54.9] Bilateral leg pain [M79.604, M79.605]  [facility]/MWF/Lt AVG /102.5kg  End-stage renal disease on hemodialysis.  Will maintain outpatient schedule if possible.   Family meeting yesterday resulted in offering sedation prior to dialysis treatments.  This is highly unusual and not offered due to prolonged clearance of medications and risk for oversedation. It's also not recommended due to the hardship of finding a provider willing to maintain this prescription outpatient. The outpatient clinic has protocols and patient is too somnolent at initiation or during treatment, treatment may be aborted. This is not a sustainable plan that nephrology agrees with.  Receiving treatment today after receiving oral Ativan 1mg , tolerating treatment well. One hypotensive episode noted.  Next treatment scheduled for Friday to maintain outpatient schedule. Would recommend holding oral ativan prior to  treatment and allow assessment of mentation. Trying to avoid oversedation.  Also patient must be able to tolerate treatment sitting in a recliner prior to discharge.   2. Anemia of chronic kidney disease Normocytic Lab Results  Component Value Date   HGB 9.5 (L) 10/20/2022  Patient receives Mircera at outpatient clinic.  Hemoglobin remains acceptable.   3. Secondary Hyperparathyroidism: with outpatient labs: PTH 180 (3/11), phosphorus 5.4, calcium 9.3   Lab Results  Component Value Date   PTH 131 (H) 03/14/2018   CALCIUM 9.9 10/20/2022   CAION 1.09 (L) 08/28/2022   PHOS 5.4 (H) 09/09/2022  Calcium and phosphorus within desired range.  4.  Hypertension with chronic kidney disease.  Home regimen includes losartan, metoprolol, and torsemide.  Prescribed losartan and metoprolol.   One episode of hypotension during treatment, UF temporarily turned off. Blood pressure 106/47 during dialysis  5. Diabetes mellitus type II with chronic kidney disease/renal manifestations: insulin dependent. Home regimen includes Novolin 70/30. Most recent hemoglobin A1c is 5.8 on 03/23/22.   Sliding scale insulin managed by primary team.  6. Vertebral osteomyelitis, seen on CT lumber spine. Neurosurgery feels patient is not a surgical candidate Recommend antibiotics. ID following. CT guided aspiration of psoas abscess on 10/13/22.   Cefepime and vancomycin discontinued due to  mentation concerns.  Remains on cefazolin only.    LOS: 8   3/28/202412:18 PM

## 2022-10-20 NOTE — Progress Notes (Signed)
PT Cancellation Note  Patient Details Name: Cynthia Dean MRN: [id number] DOB: 1951/06/10   Cancelled Treatment:    Reason Eval/Treat Not Completed: Patient at procedure or test/unavailable.  Pt currently out of the room for dialysis. PT to follow up when pt is next available for PT intervention.    Gwenlyn Saran, PT, DPT Physical Therapist - [facility]  10/20/22, 10:03 AM

## 2022-10-20 NOTE — Progress Notes (Signed)
PROGRESS NOTE    Messiah Buccieri  [id number] DOB: 1950-09-01 DOA: 10/12/2022 PCP: Lowella Bandy, MD  123A/[id number]-AA  LOS: 8 days   Brief hospital course:   Assessment & Plan: 72 y/o F w/ PMH of ESRD on HD, HLD, COPD, DM2, HTN, depression, CVA who presents w/ back pain x 3 weeks.   Pt presented w/ back pain and was found to intradiscal abscess/lumbar discitis/b/l psoas abscess on MRI. Pt was initially put on IV vanco & cefepime. Abxs changed to IV cefazolin & vanco was continued as per ID. Wound cx growing staph capitis. Will need 6-8 weeks of IV abxs as per ID.  Not a good surgical candidate as per neuro surg. Of note, pt was been very confused/agitated and pt even refused HD on 10/17/22 & 10/17/32.   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  ID consulted.  Abx de-escalated to cefazolin. --cont cefazolin --Will now need 6 weeks- 8 weeks of IV antibiotic    Confusion and agitation Hospital delirium --start seroquel 50 mg nightly  ESRD: on HD MWF.  Pt refused HD on 10/17/22, 10/18/22 and 3/27. --palliative consulted, currently family requests full scope of treatment --pre-medicate with ativan prior to HD   Generalized weakness: PT recs SNF    ACD: likely secondary ESRD. H&H are stable    Hypokalemia: managed w/ HD    Hx of seizures: continue on home dose of keppra   HLD: continue on statin    Hx of COPD: w/o exacerbation. Continue on bronchodilators    DM2: well controlled, HbA1c 5.2.  --d/c'ed BG checks   Peripheral neuropathy: likely secondary to DM2. Continue on home dose of gabapentin    HTN:  --cont losartan and Toprol   GERD: continue on PPI    Depression: severity unknown. Continue on home dose of sertraline     Overweight: BMI 28.2. Would benefit from weight loss    DVT prophylaxis: Heparin SQ Code Status:  Full code  Family Communication:  Level of care: Telemetry Medical Dispo:   The patient is from: home Anticipated d/c is to: undetermined Anticipated d/c date is: undetermined   Subjective and Interval History:  Pt went to dialysis today after receiving ativan, and accompanied by daughter.  After HD, pt complained of leg pain.   Objective: Vitals:   10/20/22 1335 10/20/22 1358 10/20/22 1413 10/20/22 1447  BP: (!) 96/47 (!) 134/43  (!) 90/54  Pulse: 79 82  86  Resp: 14 18  18   Temp:  97.9 F (36.6 C)    TempSrc:  Oral    SpO2: 100% 100%  100%  Weight:   93.6 kg   Height:        Intake/Output Summary (Last 24 hours) at 10/20/2022 1909 Last data filed at 10/20/2022 1650 Gross per 24 hour  Intake 75.95 ml  Output 800 ml  Net -724.05 ml   Filed Weights   10/19/22 0943 10/20/22 0958 10/20/22 1413  Weight: 94.7 kg 94.6 kg 93.6 kg    Examination:   Constitutional: NAD, alert, not oriented, not responding to questions or commands HEENT: conjunctivae and lids normal, EOMI CV: No cyanosis.   RESP: normal respiratory effort, on 2L   Data Reviewed: I have personally reviewed labs and imaging studies  Time spent: 35 minutes  Enzo Bi, MD [facility] If 7PM-7AM, please contact night-coverage 10/20/2022, 7:09 PM

## 2022-10-20 NOTE — Progress Notes (Signed)
  Received patient in bed to unit.   Informed consent signed and in chart.    TX duration:3.5hrs     Transported back to floor with transport and daughter at side  Hand-off given to patient's nurse.    Access used: LAVG Access issues: none   Total UF removed: 0.8kg Medication(s) given: none Post HD VS: stable Post HD weight: 73.6kg     Darrol Jump LPN [facility]

## 2022-10-21 DIAGNOSIS — M4646 Discitis, unspecified, lumbar region: Secondary | ICD-10-CM | POA: Diagnosis not present

## 2022-10-21 DIAGNOSIS — M5442 Lumbago with sciatica, left side: Secondary | ICD-10-CM | POA: Diagnosis not present

## 2022-10-21 DIAGNOSIS — M5441 Lumbago with sciatica, right side: Secondary | ICD-10-CM | POA: Diagnosis not present

## 2022-10-21 DIAGNOSIS — K6812 Psoas muscle abscess: Secondary | ICD-10-CM | POA: Diagnosis not present

## 2022-10-21 LAB — BASIC METABOLIC PANEL
Anion gap: 10 (ref 5–15)
BUN: 28 mg/dL — ABNORMAL HIGH (ref 8–23)
CO2: 28 mmol/L (ref 22–32)
Calcium: 9.2 mg/dL (ref 8.9–10.3)
Chloride: 99 mmol/L (ref 98–111)
Creatinine, Ser: 7.05 mg/dL — ABNORMAL HIGH (ref 0.44–1.00)
GFR, Estimated: 6 mL/min — ABNORMAL LOW (ref >60)
Glucose, Bld: 106 mg/dL — ABNORMAL HIGH (ref 70–99)
Potassium: 2.8 mmol/L — ABNORMAL LOW (ref 3.5–5.1)
Sodium: 137 mmol/L (ref 135–145)

## 2022-10-21 LAB — CBC
HCT: 32.2 % — ABNORMAL LOW (ref 36.0–46.0)
Hemoglobin: 9.4 g/dL — ABNORMAL LOW (ref 12.0–15.0)
MCH: 23.9 pg — ABNORMAL LOW (ref 26.0–34.0)
MCHC: 29.2 g/dL — ABNORMAL LOW (ref 30.0–36.0)
MCV: 81.7 fL (ref 80.0–100.0)
Platelets: 154 10*3/uL (ref 150–400)
RBC: 3.94 MIL/uL (ref 3.87–5.11)
RDW: 20.7 % — ABNORMAL HIGH (ref 11.5–15.5)
WBC: 7.3 10*3/uL (ref 4.0–10.5)
nRBC: 0 % (ref 0.0–0.2)

## 2022-10-21 LAB — MAGNESIUM: Magnesium: 1.9 mg/dL (ref 1.7–2.4)

## 2022-10-21 LAB — GLUCOSE, CAPILLARY: Glucose-Capillary: 90 mg/dL (ref 70–99)

## 2022-10-21 MED ORDER — EPOETIN ALFA 10000 UNIT/ML IJ SOLN
10000.0000 [IU] | INTRAMUSCULAR | Status: DC
  Administered 2022-10-21 – 2022-10-28 (×3): 10000 [IU] via INTRAVENOUS
  Filled 2022-10-21 (×4): qty 1

## 2022-10-21 MED ORDER — CHLORHEXIDINE GLUCONATE CLOTH 2 % EX PADS
6.0000 | MEDICATED_PAD | Freq: Every day | CUTANEOUS | Status: DC
  Administered 2022-10-22 – 2022-12-05 (×45): 6 via TOPICAL

## 2022-10-21 MED ORDER — EPOETIN ALFA 10000 UNIT/ML IJ SOLN
INTRAMUSCULAR | Status: AC
  Filled 2022-10-21: qty 1

## 2022-10-21 MED ORDER — PENTAFLUOROPROP-TETRAFLUOROETH EX AERO
INHALATION_SPRAY | CUTANEOUS | Status: AC
  Filled 2022-10-21: qty 30

## 2022-10-21 MED ORDER — EPOETIN ALFA 10000 UNIT/ML IJ SOLN: 10000.0000 [IU] | INTRAMUSCULAR | Status: DC

## 2022-10-21 NOTE — Progress Notes (Signed)
PT Cancellation Note  Patient Details Name: Cynthia Dean MRN: [id number] DOB: [birth year]   Cancelled Treatment:     Pt is currently off floor in HD. Acute PT will continue to follow and progress as able per current POC. Will return at a time pt is available to participate.    Willette Pa 10/21/2022, 1:04 PM

## 2022-10-21 NOTE — Progress Notes (Signed)
  Received patient in bed to unit.   Informed consent signed and in chart.    TX duration:3.5hrs     Transported back to floor  Hand-off given to patient's nurse.    Access used: LAVG Access issues: none   Total UF removed: gained 1.0kg  through boluses throughout tx d/t low b/p and low MAP Medication(s) given: epogen 10,000u Post HD VS: stable Post HD weight: 95.4kg     Darrol Jump  [facility]

## 2022-10-21 NOTE — Progress Notes (Signed)
[facility]  ROUNDING NOTE   Subjective:   Cynthia Dean is a 72 y.o. 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 She presents to the ED with worsening lower extremity pain. Patient will be admitted for Back pain [M54.9] Bilateral leg pain [M79.604, M79.605]  Patient is known to our practice and receives outpatient dialysis treatments at [facility] on a MWF schedule, supervised by Dr Candiss Norse.   Patient seen and examined on hemodialysis treatment. This is her second hemodialysis treatment in two days.   Patient is more awake and able to answer simple questions.   Objective:  Vital signs in last 24 hours:  Temp:  [96.8 F (36 C)-98.7 F (37.1 C)] 96.8 F (36 C) (03/29 0818) Pulse Rate:  [75-90] 83 (03/29 1130) Resp:  [12-23] 16 (03/29 1130) BP: (66-134)/(24-82) 122/47 (03/29 1130) SpO2:  [97 %-100 %] 100 % (03/29 1130) Weight:  [93.6 kg-94.2 kg] 94.2 kg (03/29 0820)  Weight change: -0.1 kg Filed Weights   10/20/22 0958 10/20/22 1413 10/21/22 0820  Weight: 94.6 kg 93.6 kg 94.2 kg    Intake/Output: I/O last 3 completed shifts: In: 155.2 [IV Piggyback:155.2] Out: 800 [Other:800]   Intake/Output this shift:  No intake/output data recorded.  Physical Exam: General: NAD, laying in bed  Head: Normocephalic, atraumatic.  Dry oral mucosal membranes  Eyes: Anicteric  Lungs:  Clear to auscultation, normal effort  Heart: Regular rate and rhythm  Abdomen:  Soft, nontender  Extremities: no peripheral edema.  Neurologic: Alert to name and place, moving all four extremities  Skin: No lesions  Access: Left AVG    Basic Metabolic Panel: Recent Labs  Lab 10/17/22 1217 10/18/22 0453 10/19/22 0600 10/20/22 0637 10/21/22 0358  NA 140 140 142 142 137  K 3.4* 3.4* 3.6 4.8 2.8*  CL 100 103 104 103 99  CO2 27 25 24 23 28    GLUCOSE 148* 124* 128* 104* 106*  BUN 35* 39* 47* 53* 28*  CREATININE 8.18* 9.48* 10.85* 12.21* 7.05*  CALCIUM 10.2 9.9 10.2 9.9 9.2  MG  --   --   --  2.2 1.9     Liver Function Tests: No results for input(s): "AST", "ALT", "ALKPHOS", "BILITOT", "PROT", "ALBUMIN" in the last 168 hours. No results for input(s): "LIPASE", "AMYLASE" in the last 168 hours. No results for input(s): "AMMONIA" in the last 168 hours.  CBC: Recent Labs  Lab 10/17/22 1217 10/18/22 0453 10/19/22 0600 10/20/22 0637 10/21/22 0358  WBC 8.9 8.1 7.5 6.9 7.3  HGB 9.3* 9.7* 9.6* 9.5* 9.4*  HCT 32.2* 33.9* 33.4* 33.1* 32.2*  MCV 82.8 82.7 82.7 83.0 81.7  PLT 225 215 186 177 154     Cardiac Enzymes: No results for input(s): "CKTOTAL", "CKMB", "CKMBINDEX", "TROPONINI" in the last 168 hours.  BNP: Invalid input(s): "POCBNP"  CBG: Recent Labs  Lab 10/19/22 1140 10/19/22 1626 10/19/22 2120 10/20/22 0751 10/20/22 2113  GLUCAP 124* 100* 43 118* 154*     Microbiology: Results for orders placed or performed during the hospital encounter of 10/12/22  Blood culture (routine x 2)     Status: None   Collection Time: 10/12/22  5:40 PM   Specimen: BLOOD  Result Value Ref Range Status   Specimen Description BLOOD BLOOD RIGHT HAND [facility]  Final   Special Requests   Final    BOTTLES DRAWN AEROBIC AND ANAEROBIC Blood Culture results may  not be optimal due to an inadequate volume of blood received in culture bottles   Culture   Final    NO GROWTH 5 DAYS Performed at [facility], [address] [place], [place] [address]    Report Status 10/17/2022 FINAL  Final  Blood culture (routine x 2)     Status: None   Collection Time: 10/12/22  6:56 PM   Specimen: BLOOD  Result Value Ref Range Status   Specimen Description BLOOD BLOOD RIGHT FOREARM  Final   Special Requests   Final    BOTTLES DRAWN AEROBIC ONLY Blood Culture results may not be optimal due to an inadequate volume of blood received in  culture bottles   Culture   Final    NO GROWTH 5 DAYS Performed at [facility], [address] [place], [place] [address]    Report Status 10/17/2022 FINAL  Final  Aerobic/Anaerobic Culture w Gram Stain (surgical/deep wound)     Status: None   Collection Time: 10/13/22 11:44 AM   Specimen: Abscess  Result Value Ref Range Status   Specimen Description   Final    ABSCESS Performed at [facility], [address] [place], [place] [address]    Special Requests   Final    MUSCLE ABSCESS Performed at [facility], [address] [place], [place] [address]    Gram Stain   Final    ABUNDANT WBC PRESENT, PREDOMINANTLY PMN NO ORGANISMS SEEN    Culture   Final    FEW STAPHYLOCOCCUS CAPITIS NO ANAEROBES ISOLATED Performed at [facility], [place] [address] [place], [place] [address]    Report Status 10/18/2022 FINAL  Final   Organism ID, Bacteria STAPHYLOCOCCUS CAPITIS  Final      Susceptibility   Staphylococcus capitis - MIC*    CIPROFLOXACIN <=0.5 SENSITIVE Sensitive     ERYTHROMYCIN >=8 RESISTANT Resistant     GENTAMICIN <=0.5 SENSITIVE Sensitive     OXACILLIN <=0.25 SENSITIVE Sensitive     TETRACYCLINE <=1 SENSITIVE Sensitive     VANCOMYCIN 1 SENSITIVE Sensitive     TRIMETH/SULFA <=10 SENSITIVE Sensitive     CLINDAMYCIN INTERMEDIATE Intermediate     RIFAMPIN <=0.5 SENSITIVE Sensitive     Inducible Clindamycin NEGATIVE Sensitive     * FEW STAPHYLOCOCCUS CAPITIS    Coagulation Studies: No results for input(s): "LABPROT", "INR" in the last 72 hours.  Urinalysis: No results for input(s): "COLORURINE", "LABSPEC", "PHURINE", "GLUCOSEU", "HGBUR", "BILIRUBINUR", "KETONESUR", "PROTEINUR", "UROBILINOGEN", "NITRITE", "LEUKOCYTESUR" in the last 72 hours.  Invalid input(s): "APPERANCEUR"    Imaging: No results found.   Medications:    anticoagulant sodium citrate      ceFAZolin (ANCEF) IV Stopped (10/20/22 2121)   methocarbamol (ROBAXIN)  IV Stopped (10/20/22 1705)    (feeding supplement) PROSource Plus  30 mL Oral BID BM   acetaminophen  1,000 mg Oral Q8H   atorvastatin  80 mg Oral QHS   [START ON 10/22/2022] Chlorhexidine Gluconate Cloth  6 each Topical Q0600   feeding supplement (NEPRO CARB STEADY)  237 mL Oral TID BM   gabapentin  100 mg Oral BID   heparin injection (subcutaneous)  5,000 Units Subcutaneous Q8H   levETIRAcetam  1,000 mg Oral Daily   levETIRAcetam  250 mg Oral Q M,W,F   LORazepam  1 mg Oral Once per day on Mon Wed Fri   losartan  100 mg Oral Daily   metoprolol succinate  100 mg Oral Daily   multivitamin  1 tablet Oral QHS   nystatin  5 mL Oral QID   pantoprazole  20 mg Oral BID   pentafluoroprop-tetrafluoroeth       QUEtiapine  50 mg Oral QHS   sertraline  25 mg Oral Daily   sodium chloride flush  10-40 mL Intracatheter Q12H   umeclidinium-vilanterol  1 puff Inhalation Daily   alteplase, anticoagulant sodium citrate, heparin, hydrALAZINE, HYDROmorphone, lidocaine (PF), lidocaine-prilocaine, LORazepam, methocarbamol (ROBAXIN) IV, pentafluoroprop-tetrafluoroeth, pentafluoroprop-tetrafluoroeth, senna-docusate, sodium chloride flush  Assessment/ Plan:  Ms. Melainie Alpaugh is a 72 y.o.  female with end stage renal disease on hemodialysis, coronary artery disease, COPD with intermittent home O2, hypertension, hyperlipidemia, stroke, diabetes mellitus type II, and breast cancer who was admitted to [facility] on 10/12/2022 for Back pain [M54.9] Bilateral leg pain [M79.604, M79.605]  Patient was found to have vertebral osteomyelitis and psoas abscess. Cultures positive for staph capitis  [facility] MWF Left AVG 102.5kg  End-stage renal disease on hemodialysis. Resumed on MWF schedule. Patient previously with altered mental status and was not cooperative with dialysis. However now her mental status is improving and allowing for hemodialysis treatment. However patient did get lorazepam 1mg  PO  prior to dialysis.  - Continue MWF schedule. Tolerating hemodialysis treatment today.  - Plan on hemodialysis treatment for Monday, plan on no sedatives including lorazepam before dialysis treatment.  - Discontinue gapapentin.   2. Anemia of chronic kidney disease Normocytic Lab Results  Component Value Date   HGB 9.4 (L) 10/21/2022  Patient receives Mircera at outpatient clinic.  EPO ordered.   3. Secondary Hyperparathyroidism: with outpatient labs: PTH 180 (3/11), phosphorus 5.4, calcium 9.3   Lab Results  Component Value Date   PTH 131 (H) 03/14/2018   CALCIUM 9.2 10/21/2022   CAION 1.09 (L) 08/28/2022   PHOS 5.4 (H) 09/09/2022  Calcium and phosphorus within desired range. Not currently on binders or vitamin D agent.   4.  Hypotension on hemodialysis treatment. Holding home regimen of losartan, metoprolol and torsemide.   5. Diabetes mellitus type II with chronic kidney disease/renal manifestations: insulin dependent. Home regimen includes Novolin 70/30. Most recent hemoglobin A1c is 5.2% on admission.   6. Vertebral osteomyelitis, seen on CT lumber spine. Neurosurgery feels patient is not a surgical candidate CT guided aspiration of psoas abscess on 10/13/22. Cultures positive for Staph Capitis.   - Cefazolin with hemodialysis     LOS: 9 Alivea Gladson 3/29/202411:58 AM

## 2022-10-21 NOTE — Progress Notes (Signed)
Date of Admission:  10/12/2022      ID: Cynthia Dean is a 72 y.o. 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  She had been to the ED and admitted many times after that Oct 2023- Back pain and rt sciatica- MRI lumbar spine done on 05/11/22 Degenerative disc disease.  Pt did not want to pursue chemo or radiation for the breast cancer ( ER-, PR 20%, HER 2 positive)  She was hospitalized in Feb 2024 for severe anemia -Hb 5.4- GI work up negative except for diverticulosis ( not bleeding on the day of the scope)  Back again with back pain and b/l radiation of pain down the leg  MRI this time showed b/l lumbar abscess, L4-L5 discitis, b/l psoas abscess  Aspiration of the rt psoas abscess on 10/13/22 is positive for staph capitis   Partner at bed side Subjective: Calm Responding to questions appropriately  Medications:   (feeding supplement) PROSource Plus  30 mL Oral BID BM   acetaminophen  1,000 mg Oral Q8H   atorvastatin  80 mg Oral QHS   [START ON 10/22/2022] Chlorhexidine Gluconate Cloth  6 each Topical Q0600   epoetin alfa       epoetin  (EPOGEN/PROCRIT) injection  10,000 Units Intravenous Q M,W,F-HD   feeding supplement (NEPRO CARB STEADY)  237 mL Oral TID BM   heparin injection (subcutaneous)  5,000 Units Subcutaneous Q8H   levETIRAcetam  1,000 mg Oral Daily   levETIRAcetam  250 mg Oral Q M,W,F   LORazepam  1 mg Oral Once per day on Mon Wed Fri   multivitamin  1 tablet Oral QHS   nystatin  5 mL Oral QID   pantoprazole  20 mg Oral BID   QUEtiapine  50 mg Oral QHS   sertraline  25 mg Oral Daily   sodium chloride flush  10-40 mL Intracatheter Q12H   umeclidinium-vilanterol  1 puff Inhalation Daily    Objective: Vital signs in last 24 hours: Patient Vitals for the past 24 hrs:  BP Temp Temp src Pulse Resp SpO2 Weight  10/21/22 2023 126/85 98.6 F (37 C) Oral 74 19 100 % --  10/21/22 1633 (!) 107/47 98.5 F (36.9 C) -- (!) 49 18 93 % --  10/21/22 1223 -- -- -- -- -- -- 95.4 kg  10/21/22 1200 (!) 106/58 97.9 F (36.6 C) Oral 74 18 100 % --  10/21/22 1130 (!) 122/47 -- -- 83 16 100 % --  10/21/22 1100 (!) 104/47 -- -- 81 15 99 % --  10/21/22 1030 (!) 110/31 -- -- 84 17 100 % --  10/21/22 1000 (!) 96/44 -- -- 89 (!) 23  97 % --  10/21/22 0951 (!) 66/41 -- -- -- -- -- --  10/21/22 0930 (!) 104/37 -- -- 79 15 100 % --  10/21/22 0927 (!) 102/24 -- -- -- -- -- --  10/21/22 0910 (!) 100/51 -- -- -- -- -- --  10/21/22 0900 (!) 72/62 -- -- 80 15 97 % --  10/21/22 0839 (!) 117/41 -- -- 75 12 99 % --  10/21/22 0820 -- -- -- -- -- -- 94.2 kg  10/21/22 0818 94/82 (!) 96.8 F (36 C) Oral 83 14 100 % --  10/21/22 0745 (!) 123/41 98.5 F (36.9 C) -- 88 17 99 % --  10/21/22 0624 122/63 98.7 F (37.1 C) -- 85 20 97 % --  10/20/22 2113 (!) 116/54 98.7 F (37.1 C) Oral 90 18 100 % --       PHYSICAL EXAM:  General: awake, calm Some dysphasia expressive Lungs: b/l air entry. Heart: s1s2 Abdomen: Soft, Extremities: left AVG site -okay Skin: No rashes or lesions. Or bruising  CNS rt lower extremity movement less compared  to the rest  Lab Results    Latest Ref Rng & Units 10/21/2022    3:58 AM 10/20/2022    6:37 AM 10/19/2022    6:00 AM  CBC  WBC 4.0 - 10.5 K/uL 7.3  6.9  7.5   Hemoglobin 12.0 - 15.0 g/dL 9.4  9.5  9.6   Hematocrit 36.0 - 46.0 % 32.2  33.1  33.4   Platelets 150 - 400 K/uL 154  177  186        Latest Ref Rng & Units 10/21/2022    3:58 AM 10/20/2022    6:37 AM 10/19/2022    6:00 AM  CMP  Glucose 70 - 99 mg/dL 106  104  128   BUN 8 - 23 mg/dL 28  53  47   Creatinine 0.44 - 1.00 mg/dL 7.05  12.21  10.85   Sodium 135 - 145 mmol/L 137  142  142   Potassium 3.5 - 5.1 mmol/L 2.8  4.8  3.6   Chloride 98 - 111 mmol/L 99  103  104   CO2 22 - 32 mmol/L 28  23  24    Calcium 8.9 - 10.3 mg/dL 9.2  9.9  10.2     Erythrocyte Sedimentation Rate     Component Value Date/Time   ESRSEDRATE 70 (H) 10/13/2022 0006      Microbiology: 10/12/22 BC- NG so far 3/21 psoas abscess   Studies/Results: No results found.   Assessment/Plan:  Lumbar discitis,L4-L5  epidural phlegmon and b/l psoas abscess on MRI Underwent IR aspiration of psoas abscess on 10/13/22 Vanco and cefepime stated on 10/13/22 after procedure Gram stain numerous wbc no organism Culture positive for staph capitis a coag neg bacteria Blood culture ng On 03/19/22 she had 4/4 blood culture positive for staph capitis At that time she had left breast swelling , induration initially thought to be an abscess post lumpectomy on 03/04/22 for malignancy. US done showed complicated cystic lesion measuring 14.5 x 6.3 x 9.2 cm. Left breast. IR aspirated 135 ml of old blood from the left breast and culture was negative She was then treated with a total of 2 weeks of appropriate antibiotic for the pan sensitive staph capitis ( Iv ceftriaxone, vanco and then cefazolin) The 2 staph capitis have slightly different susceptibility pattern- the one in Aug from blood was pan sensitive and the one from the abscess has resistance to  clinda and  erythromycin  Is the lumbar infection indolent from Aug bacteremia, or is it from a new infection because she is at risk for one due to ESRD, being on dialysis and having DDD Or did she have endocarditis and seeded the spine. Last echo from 03/22/22 did not show any valve vegetations. It was repeated on 3/27 and shows no vegetation Blood culture this time negative   Will now need  cefazolin6 weeks- 8 weeks IV given during dilaysis- OPAT order placed   Confusion/agitation- resolved Could be medication- Opioids/cefepime   ESRD on dialysis Recent malfunctioning AVG and underwent fistulogram, angioplasty and stent placement on 09/22/22     Left breast Carcinoma- s/p  lumpectomy and targeted axillary dissection She did not want to take chemo/radiation    Anemia   DM -    HTN on metoprolol  H/o Pulmonary embolism H/o CVA  Discussed the management with patient and her partner at bed side ID will follow her peripherally this weekend- l [facility] on call if needed

## 2022-10-21 NOTE — Progress Notes (Signed)
PHARMACY CONSULT NOTE FOR:  OUTPATIENT  PARENTERAL ANTIBIOTIC THERAPY (OPAT)  Indication: Lumbar discitis and psoas abscess Regimen: Cefazolin during HD on MWF. 2g-2g-3g End date: 12/02/2022  IV antibiotic - Nephrology team aware and will coordinate with outpatient HD clinic    Thank you for allowing pharmacy to be a part of this patient's care.  Hajer Dwyer Rodriguez-Guzman PharmD, BCPS 10/21/2022 3:12 PM

## 2022-10-21 NOTE — Progress Notes (Signed)
OT Cancellation Note  Patient Details Name: Cynthia Dean MRN: [id number] DOB: February 12, 1951   Cancelled Treatment:    Reason Eval/Treat Not Completed: Patient at procedure or test/ unavailable. Chart reviewed, pt off unit for HD, will re-attempt as available.   Dessie Coma, M.S. OTR/L  10/21/22, 10:47 AM  ascom [phone]

## 2022-10-21 NOTE — Treatment Plan (Signed)
Diagnosis: Staph capitis psoas abscess, lumbar diskitis ESRD on dialysis     Allergies  Allergen Reactions   Oxycodone Nausea And Vomiting   Oxycodone-Acetaminophen Other (See Comments)   Wound Dressing Adhesive    Hydrocodone     Intolerant more than allergic   Tape Itching    Skin Dermatitis/itching (tape adhesive) Skin Dermatitis/itching (tape adhesive)   Tapentadol Itching    Skin Dermatitis/itching (tape adhesive) Skin Dermatitis/itching (tape adhesive)     OPAT Orders Discharge antibiotics: Cefazolin given during dialysis 2 grams- 2 grams - 3 grams For 6 weeks End Date: 12/02/22  Labs weekly while on IV antibiotics: _X_ CBC with differential __ BMP _X_ CMP _X_ CRP _X_ ESR   Fax weekly lab results  promptly to [phone]  Clinic Follow Up Appt: 12/01/22 at 10.30Am   Call [phone] with any questions or concerns

## 2022-10-21 NOTE — Progress Notes (Signed)
B/p ranging from [id number] systolic and  Map dec below 60. Dr Juleen China made aware.  Ordered 557ml bolus saline and pt placed UF off.  MD will be here to see pt.  Pt's K+ 2.8 and placed on 4K+ bath.  MD also made aware.

## 2022-10-21 NOTE — Progress Notes (Signed)
PROGRESS NOTE    Cynthia Dean  [id number] DOB: January 17, 1951 DOA: 10/12/2022 PCP: Lowella Bandy, MD  [id number]  LOS: 9 days   Brief hospital course:   Assessment & Plan: 72 y/o F w/ PMH of ESRD on HD, HLD, COPD, DM2, HTN, depression, CVA who presents w/ back pain x 3 weeks.   Pt presented w/ back pain and was found to intradiscal abscess/lumbar discitis/b/l psoas abscess on MRI. Pt was initially put on IV vanco & cefepime. Abxs changed to IV cefazolin & vanco was continued as per ID. Wound cx growing staph capitis. Will need 6-8 weeks of IV abxs as per ID.  Not a good surgical candidate as per neuro surg. Of note, pt was been very confused/agitated and pt even refused HD on 10/17/22 & 10/17/32.   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  ID consulted.  Abx de-escalated to cefazolin. --cont cefazolin, for 6 weeks, End Date: 12/02/22   Confusion and agitation Hospital delirium --mental status started to improve today --cont seroquel 50 mg nightly (new)  ESRD: on HD MWF.  Pt refused HD on 10/17/22, 10/18/22 and 3/27. --palliative consulted, currently family requests full scope of treatment.   --pre-medicate with ativan prior to HD   Generalized weakness:  PT recs SNF    ACD:  likely secondary ESRD. H&H are stable    Hypokalemia:  managed w/ HD    Hx of seizures:  continue on home dose of keppra   HLD:  continue on statin    Hx of COPD:  w/o exacerbation.  Continue on bronchodilators    DM2: well controlled HbA1c 5.2.  --d/c'ed BG checks   Peripheral neuropathy:  likely secondary to DM2.  Continue on home dose of gabapentin    HTN:  --hold losartan and Toprol 2/2 hypotension   GERD:  continue on PPI    Depression: severity unknown.  Continue on home dose of sertraline     Overweight: BMI 28.2.  Would benefit from  weight loss    DVT prophylaxis: Heparin SQ Code Status: Full code  Family Communication: fiance updated at bedside today Level of care: Telemetry Medical Dispo:   The patient is from: home Anticipated d/c is to: SNF Anticipated d/c date is: undetermined   Subjective and Interval History:  Pt was more lucid today, able to respond to questions.   Objective: Vitals:   10/21/22 1130 10/21/22 1200 10/21/22 1223 10/21/22 1633  BP: (!) 122/47 (!) 106/58  (!) 107/47  Pulse: 83 74  (!) 49  Resp: 16 18  18   Temp:  97.9 F (36.6 C)  98.5 F (36.9 C)  TempSrc:  Oral    SpO2: 100% 100%  93%  Weight:   95.4 kg   Height:        Intake/Output Summary (Last 24 hours) at 10/21/2022 1742 Last data filed at 10/21/2022 1200 Gross per 24 hour  Intake 79.22 ml  Output 0 ml  Net 79.22 ml   Filed Weights   10/20/22 1413 10/21/22 0820 10/21/22 1223  Weight: 93.6 kg 94.2 kg 95.4 kg    Examination:   Constitutional: NAD, alert, oriented to self only HEENT: conjunctivae and lids normal, EOMI CV: No cyanosis.   RESP: normal respiratory effort Neuro: II - XII grossly intact.     Data Reviewed: I have personally reviewed labs and imaging studies  Time spent: 35 minutes  Enzo Bi, MD [facility] If 7PM-7AM, please contact night-coverage 10/21/2022, 5:42 PM

## 2022-10-22 DIAGNOSIS — M5442 Lumbago with sciatica, left side: Secondary | ICD-10-CM | POA: Diagnosis not present

## 2022-10-22 DIAGNOSIS — M5441 Lumbago with sciatica, right side: Secondary | ICD-10-CM | POA: Diagnosis not present

## 2022-10-22 LAB — CBC
HCT: 32.5 % — ABNORMAL LOW (ref 36.0–46.0)
Hemoglobin: 9.6 g/dL — ABNORMAL LOW (ref 12.0–15.0)
MCH: 23.8 pg — ABNORMAL LOW (ref 26.0–34.0)
MCHC: 29.5 g/dL — ABNORMAL LOW (ref 30.0–36.0)
MCV: 80.4 fL (ref 80.0–100.0)
Platelets: 142 10*3/uL — ABNORMAL LOW (ref 150–400)
RBC: 4.04 MIL/uL (ref 3.87–5.11)
RDW: 20.5 % — ABNORMAL HIGH (ref 11.5–15.5)
WBC: 6.7 10*3/uL (ref 4.0–10.5)
nRBC: 0 % (ref 0.0–0.2)

## 2022-10-22 LAB — BASIC METABOLIC PANEL
Anion gap: 10 (ref 5–15)
BUN: 19 mg/dL (ref 8–23)
CO2: 27 mmol/L (ref 22–32)
Calcium: 9.9 mg/dL (ref 8.9–10.3)
Chloride: 100 mmol/L (ref 98–111)
Creatinine, Ser: 5.14 mg/dL — ABNORMAL HIGH (ref 0.44–1.00)
GFR, Estimated: 8 mL/min — ABNORMAL LOW (ref >60)
Glucose, Bld: 123 mg/dL — ABNORMAL HIGH (ref 70–99)
Potassium: 3.4 mmol/L — ABNORMAL LOW (ref 3.5–5.1)
Sodium: 137 mmol/L (ref 135–145)

## 2022-10-22 LAB — MAGNESIUM: Magnesium: 1.9 mg/dL (ref 1.7–2.4)

## 2022-10-22 NOTE — Progress Notes (Signed)
[facility]  ROUNDING NOTE   Subjective:   Cynthia Dean is a 72 y.o. 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 She presents to the ED with worsening lower extremity pain. Patient will be admitted for Back pain [M54.9] Bilateral leg pain [M79.604, M79.605]  Patient is known to our practice and receives outpatient dialysis treatments at [facility] on a MWF schedule, supervised by Dr Candiss Norse.   Hemodialysis treatment yesterday. Tolerated treatment well. UF of even. Patient was given lorazepam prior to treatment.   Patient is more awake and able to answer simple questions.   Objective:  Vital signs in last 24 hours:  Temp:  [97.9 F (36.6 C)-98.7 F (37.1 C)] 98.7 F (37.1 C) (03/30 0559) Pulse Rate:  [49-84] 81 (03/30 0823) Resp:  [15-20] 16 (03/30 0823) BP: (103-135)/(47-85) 135/51 (03/30 0823) SpO2:  [93 %-100 %] 100 % (03/30 0823) Weight:  [95.4 kg] 95.4 kg (03/29 1223)  Weight change: -0.4 kg Filed Weights   10/20/22 1413 10/21/22 0820 10/21/22 1223  Weight: 93.6 kg 94.2 kg 95.4 kg    Intake/Output: I/O last 3 completed shifts: In: 79.2 [IV Piggyback:79.2] Out: 0    Intake/Output this shift:  No intake/output data recorded.  Physical Exam: General: NAD, laying in bed  Head: Normocephalic, atraumatic.  Moist oral mucosal membranes  Eyes: Anicteric  Lungs:  Clear to auscultation, normal effort  Heart: Regular rate and rhythm  Abdomen:  Soft, nontender  Extremities: no peripheral edema.  Neurologic: Alert to name only, moving all four extremities  Skin: No lesions  Access: Left AVG    Basic Metabolic Panel: Recent Labs  Lab 10/18/22 0453 10/19/22 0600 10/20/22 0637 10/21/22 0358 10/22/22 0530  NA 140 142 142 137 137  K 3.4* 3.6 4.8 2.8* 3.4*  CL 103 104 103 99 100  CO2 25 24 23 28 27   GLUCOSE  124* 128* 104* 106* 123*  BUN 39* 47* 53* 28* 19  CREATININE 9.48* 10.85* 12.21* 7.05* 5.14*  CALCIUM 9.9 10.2 9.9 9.2 9.9  MG  --   --  2.2 1.9 1.9     Liver Function Tests: No results for input(s): "AST", "ALT", "ALKPHOS", "BILITOT", "PROT", "ALBUMIN" in the last 168 hours. No results for input(s): "LIPASE", "AMYLASE" in the last 168 hours. No results for input(s): "AMMONIA" in the last 168 hours.  CBC: Recent Labs  Lab 10/18/22 0453 10/19/22 0600 10/20/22 0637 10/21/22 0358 10/22/22 0530  WBC 8.1 7.5 6.9 7.3 6.7  HGB 9.7* 9.6* 9.5* 9.4* 9.6*  HCT 33.9* 33.4* 33.1* 32.2* 32.5*  MCV 82.7 82.7 83.0 81.7 80.4  PLT 215 186 177 154 142*     Cardiac Enzymes: No results for input(s): "CKTOTAL", "CKMB", "CKMBINDEX", "TROPONINI" in the last 168 hours.  BNP: Invalid input(s): "POCBNP"  CBG: Recent Labs  Lab 10/19/22 1626 10/19/22 2120 10/20/22 0751 10/20/22 2113 10/21/22 1633  GLUCAP 100* 98 118* 154* 64     Microbiology: Results for orders placed or performed during the hospital encounter of 10/12/22  Blood culture (routine x 2)     Status: None   Collection Time: 10/12/22  5:40 PM   Specimen: BLOOD  Result Value Ref Range Status   Specimen Description BLOOD BLOOD RIGHT HAND [facility]  Final   Special Requests   Final    BOTTLES DRAWN AEROBIC AND ANAEROBIC Blood Culture results may not be optimal due  to an inadequate volume of blood received in culture bottles   Culture   Final    NO GROWTH 5 DAYS Performed at [facility], [address] [place], [place] [address]    Report Status 10/17/2022 FINAL  Final  Blood culture (routine x 2)     Status: None   Collection Time: 10/12/22  6:56 PM   Specimen: BLOOD  Result Value Ref Range Status   Specimen Description BLOOD BLOOD RIGHT FOREARM  Final   Special Requests   Final    BOTTLES DRAWN AEROBIC ONLY Blood Culture results may not be optimal due to an inadequate volume of blood received in culture bottles    Culture   Final    NO GROWTH 5 DAYS Performed at [facility], [address], [place], [place] [address]    Report Status 10/17/2022 FINAL  Final  Aerobic/Anaerobic Culture w Gram Stain (surgical/deep wound)     Status: None   Collection Time: 10/13/22 11:44 AM   Specimen: Abscess  Result Value Ref Range Status   Specimen Description   Final    ABSCESS Performed at [facility], [address], [place], [place] [address]    Special Requests   Final    MUSCLE ABSCESS Performed at [facility], [address] [place], [place] [address]    Gram Stain   Final    ABUNDANT WBC PRESENT, PREDOMINANTLY PMN NO ORGANISMS SEEN    Culture   Final    FEW STAPHYLOCOCCUS CAPITIS NO ANAEROBES ISOLATED Performed at [facility], [place] [address], [place], [place] [address]    Report Status 10/18/2022 FINAL  Final   Organism ID, Bacteria STAPHYLOCOCCUS CAPITIS  Final      Susceptibility   Staphylococcus capitis - MIC*    CIPROFLOXACIN <=0.5 SENSITIVE Sensitive     ERYTHROMYCIN >=8 RESISTANT Resistant     GENTAMICIN <=0.5 SENSITIVE Sensitive     OXACILLIN <=0.25 SENSITIVE Sensitive     TETRACYCLINE <=1 SENSITIVE Sensitive     VANCOMYCIN 1 SENSITIVE Sensitive     TRIMETH/SULFA <=10 SENSITIVE Sensitive     CLINDAMYCIN INTERMEDIATE Intermediate     RIFAMPIN <=0.5 SENSITIVE Sensitive     Inducible Clindamycin NEGATIVE Sensitive     * FEW STAPHYLOCOCCUS CAPITIS    Coagulation Studies: No results for input(s): "LABPROT", "INR" in the last 72 hours.  Urinalysis: No results for input(s): "COLORURINE", "LABSPEC", "PHURINE", "GLUCOSEU", "HGBUR", "BILIRUBINUR", "KETONESUR", "PROTEINUR", "UROBILINOGEN", "NITRITE", "LEUKOCYTESUR" in the last 72 hours.  Invalid input(s): "APPERANCEUR"    Imaging: No results found.   Medications:    anticoagulant sodium citrate      ceFAZolin (ANCEF) IV 1 g (10/21/22 2055)   methocarbamol (ROBAXIN) IV Stopped (10/20/22  1705)    (feeding supplement) PROSource Plus  30 mL Oral BID BM   acetaminophen  1,000 mg Oral Q8H   atorvastatin  80 mg Oral QHS   Chlorhexidine Gluconate Cloth  6 each Topical Q0600   epoetin (EPOGEN/PROCRIT) injection  10,000 Units Intravenous Q M,W,F-HD   feeding supplement (NEPRO CARB STEADY)  237 mL Oral TID BM   heparin injection (subcutaneous)  5,000 Units Subcutaneous Q8H   levETIRAcetam  1,000 mg Oral Daily   levETIRAcetam  250 mg Oral Q M,W,F   LORazepam  1 mg Oral Once per day on Mon Wed Fri   multivitamin  1 tablet Oral QHS   nystatin  5 mL Oral QID   pantoprazole  20 mg Oral  BID   QUEtiapine  50 mg Oral QHS   sertraline  25 mg Oral Daily   sodium chloride flush  10-40 mL Intracatheter Q12H   umeclidinium-vilanterol  1 puff Inhalation Daily   alteplase, anticoagulant sodium citrate, heparin, hydrALAZINE, HYDROmorphone, lidocaine (PF), lidocaine-prilocaine, LORazepam, methocarbamol (ROBAXIN) IV, pentafluoroprop-tetrafluoroeth, senna-docusate, sodium chloride flush  Assessment/ Plan:  [name] is a 72 y.o.  female with end stage renal disease on hemodialysis, coronary artery disease, COPD with intermittent home O2, hypertension, hyperlipidemia, stroke, diabetes mellitus type II, and breast cancer who was admitted to [facility] on 10/12/2022 for Back pain [M54.9] Bilateral leg pain [M79.604, M79.605]  Patient was found to have vertebral osteomyelitis and psoas abscess. Cultures positive for staph capitis  [facility] MWF Left AVG 102.5kg  End-stage renal disease on hemodialysis. Resumed on MWF schedule. Patient previously with altered mental status and was not cooperative with dialysis. However now her mental status is improving and allowing for hemodialysis treatment.  - Continue MWF schedule.   - Plan on hemodialysis treatment for Monday, plan on no sedatives including lorazepam before dialysis treatment.   2. Anemia of chronic kidney  disease Normocytic Lab Results  Component Value Date   HGB 9.6 (L) 10/22/2022  Patient receives Mircera at outpatient clinic.  EPO ordered.   3. Secondary Hyperparathyroidism: with outpatient labs: PTH 180 (3/11), phosphorus 5.4, calcium 9.3   Lab Results  Component Value Date   PTH 131 (H) 03/14/2018   CALCIUM 9.9 10/22/2022   CAION 1.09 (L) 08/28/2022   PHOS 5.4 (H) 09/09/2022  Calcium and phosphorus within desired range. Not currently on binders or vitamin D agent.   4.  Hypotension on hemodialysis treatment. Holding home regimen of losartan, metoprolol and torsemide.   5. Diabetes mellitus type II with chronic kidney disease/renal manifestations: insulin dependent. Home regimen includes Novolin 70/30. Most recent hemoglobin A1c is 5.2% on admission.   6. Vertebral osteomyelitis, seen on CT lumber spine. Neurosurgery feels patient is not a surgical candidate CT guided aspiration of psoas abscess on 10/13/22. Cultures positive for Staph Capitis.   - Cefazolin with hemodialysis for 8 weeks.     LOS: 10 Cynthia Dean 3/30/202410:52 AM

## 2022-10-22 NOTE — Progress Notes (Signed)
OT Cancellation Note  Patient Details Name: Cynthia Dean MRN: [id number] DOB: 17-Oct-1950   Cancelled Treatment:    Reason Eval/Treat Not Completed: Patient declined, no reason specified. Chart reviewed, upon arrival pt refusing mobility attempts. Family at bed side offers encouragement however pt states she does not want anyone to hurt her. Will re-attempt as available and pt willing to participate.   Dessie Coma, M.S. OTR/L  10/22/22, 3:35 PM  ascom [phone]

## 2022-10-22 NOTE — Progress Notes (Signed)
PROGRESS NOTE    Cynthia Dean  [id number] DOB: [birth year] DOA: 10/12/2022 PCP: Lowella Bandy, MD  [id number]  LOS: 10 days   Brief hospital course:   Assessment & Plan: 72 y/o F w/ PMH of ESRD on HD, HLD, COPD, DM2, HTN, depression, CVA who presents w/ back pain x 3 weeks.   Pt presented w/ back pain and was found to intradiscal abscess/lumbar discitis/b/l psoas abscess on MRI. Pt was initially put on IV vanco & cefepime. Abxs changed to IV cefazolin & vanco was continued as per ID. Wound cx growing staph capitis. Will need 6-8 weeks of IV abxs as per ID.  Not a good surgical candidate as per neuro surg. Of note, pt was been very confused/agitated and pt even refused HD on 10/17/22 & 10/17/32.   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  ID consulted.  Abx de-escalated to cefazolin. --cont cefazolin, for 6 weeks, End Date: 12/02/22   Confusion and agitation Hospital delirium --mental status started to improve today --cont seroquel 50 mg nightly (new)  ESRD: on HD MWF.  Pt refused HD on 10/17/22, 10/18/22 and 3/27. --palliative consulted, currently family requests full scope of treatment.   --will skip ativan for next HD   Generalized weakness:  PT recs SNF    ACD:  likely secondary ESRD. H&H are stable    Hypokalemia:  managed w/ HD    Hx of seizures:  continue on home dose of keppra   HLD:  continue on statin    Hx of COPD:  w/o exacerbation.  Continue on bronchodilators    DM2: well controlled HbA1c 5.2.  --d/c'ed BG checks   Peripheral neuropathy:  likely secondary to DM2.  Continue on home dose of gabapentin    HTN:  --hold losartan and Toprol 2/2 hypotension   GERD:  continue on PPI    Depression: severity unknown.  Continue on home dose of sertraline     Overweight: BMI 28.2.  Would benefit from weight  loss    DVT prophylaxis: Heparin SQ Code Status: Full code  Family Communication:  Level of care: Telemetry Medical Dispo:   The patient is from: home Anticipated d/c is to: SNF Anticipated d/c date is: whenever bed available   Subjective and Interval History:  Pt was calm, tracking, but still not oriented and became annoyed with questioning.     Objective: Vitals:   10/21/22 2023 10/22/22 0559 10/22/22 0823 10/22/22 1658  BP: 126/85 103/66 (!) 135/51 (!) 145/113  Pulse: 74 84 81 (!) 108  Resp: 19 20 16 18   Temp: 98.6 F (37 C) 98.7 F (37.1 C)    TempSrc: Oral     SpO2: 100% 100% 100% 97%  Weight:      Height:        Intake/Output Summary (Last 24 hours) at 10/22/2022 1951 Last data filed at 10/22/2022 1054 Gross per 24 hour  Intake 0 ml  Output --  Net 0 ml   Filed Weights   10/20/22 1413 10/21/22 0820 10/21/22 1223  Weight: 93.6 kg 94.2 kg 95.4 kg    Examination:   Constitutional: NAD, alert, oriented to self only HEENT: conjunctivae and lids normal, EOMI CV: No cyanosis.   RESP: normal respiratory effort, on RA Neuro: II - XII grossly intact.     Data Reviewed: I have personally reviewed labs and imaging  studies  Time spent: 35 minutes  Enzo Bi, MD [facility] If 7PM-7AM, please contact night-coverage 10/22/2022, 7:51 PM

## 2022-10-23 DIAGNOSIS — M5441 Lumbago with sciatica, right side: Secondary | ICD-10-CM | POA: Diagnosis not present

## 2022-10-23 DIAGNOSIS — M5442 Lumbago with sciatica, left side: Secondary | ICD-10-CM | POA: Diagnosis not present

## 2022-10-23 LAB — GLUCOSE, CAPILLARY
Glucose-Capillary: 118 mg/dL — ABNORMAL HIGH (ref 70–99)
Glucose-Capillary: 114 mg/dL — ABNORMAL HIGH (ref 70–99)
Glucose-Capillary: 161 mg/dL — ABNORMAL HIGH (ref 70–99)

## 2022-10-23 NOTE — Progress Notes (Signed)
[facility]  ROUNDING NOTE   Subjective:   Cynthia Dean is a 72 y.o. 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 She presents to the ED with worsening lower extremity pain. Patient will be admitted for Back pain [M54.9] Bilateral leg pain [M79.604, M79.605]  Patient is known to our practice and receives outpatient dialysis treatments at [facility] on a MWF schedule, supervised by Dr Candiss Norse.   More awake and alert today. Mental status is closer to patient's baseline. She is able to tell jokes and smile today   Objective:  Vital signs in last 24 hours:  Temp:  [97.3 F (36.3 C)-98.2 F (36.8 C)] 98.2 F (36.8 C) (03/31 1158) Pulse Rate:  [89-108] 89 (03/31 1158) Resp:  [16-18] 16 (03/31 1158) BP: (124-151)/(50-113) 148/53 (03/31 1158) SpO2:  [92 %-97 %] 93 % (03/31 1158)  Weight change:  Filed Weights   10/20/22 1413 10/21/22 0820 10/21/22 1223  Weight: 93.6 kg 94.2 kg 95.4 kg    Intake/Output: No intake/output data recorded.   Intake/Output this shift:  Total I/O In: 155 [IV Piggyback:155] Out: -   Physical Exam: General: NAD, laying in bed  Head: Normocephalic, atraumatic.  Moist oral mucosal membranes  Eyes: Anicteric  Lungs:  Clear to auscultation, normal effort  Heart: Regular rate and rhythm  Abdomen:  Soft, nontender  Extremities: no peripheral edema.  Neurologic: Alert and oriented. Slow to respond  Skin: No lesions  Access: Left AVG    Basic Metabolic Panel: Recent Labs  Lab 10/18/22 0453 10/19/22 0600 10/20/22 0637 10/21/22 0358 10/22/22 0530  NA 140 142 142 137 137  K 3.4* 3.6 4.8 2.8* 3.4*  CL 103 104 103 99 100  CO2 25 24 23 28 27   GLUCOSE 124* 128* 104* 106* 123*  BUN 39* 47* 53* 28* 19  CREATININE 9.48* 10.85* 12.21* 7.05* 5.14*  CALCIUM 9.9 10.2 9.9 9.2 9.9  MG  --   --  2.2  1.9 1.9     Liver Function Tests: No results for input(s): "AST", "ALT", "ALKPHOS", "BILITOT", "PROT", "ALBUMIN" in the last 168 hours. No results for input(s): "LIPASE", "AMYLASE" in the last 168 hours. No results for input(s): "AMMONIA" in the last 168 hours.  CBC: Recent Labs  Lab 10/18/22 0453 10/19/22 0600 10/20/22 0637 10/21/22 0358 10/22/22 0530  WBC 8.1 7.5 6.9 7.3 6.7  HGB 9.7* 9.6* 9.5* 9.4* 9.6*  HCT 33.9* 33.4* 33.1* 32.2* 32.5*  MCV 82.7 82.7 83.0 81.7 80.4  PLT 215 186 177 154 142*     Cardiac Enzymes: No results for input(s): "CKTOTAL", "CKMB", "CKMBINDEX", "TROPONINI" in the last 168 hours.  BNP: Invalid input(s): "POCBNP"  CBG: Recent Labs  Lab 10/20/22 0751 10/20/22 2113 10/21/22 1633 10/23/22 0831 10/23/22 1156  GLUCAP 118* 154* 90 118* 114*     Microbiology: Results for orders placed or performed during the hospital encounter of 10/12/22  Blood culture (routine x 2)     Status: None   Collection Time: 10/12/22  5:40 PM   Specimen: BLOOD  Result Value Ref Range Status   Specimen Description BLOOD BLOOD RIGHT HAND [facility]  Final   Special Requests   Final    BOTTLES DRAWN AEROBIC AND ANAEROBIC Blood Culture results may not be optimal due to an inadequate volume of blood received in culture bottles   Culture   Final    NO GROWTH  5 DAYS Performed at [facility], [address] [place], [place] [address]    Report Status 10/17/2022 FINAL  Final  Blood culture (routine x 2)     Status: None   Collection Time: 10/12/22  6:56 PM   Specimen: BLOOD  Result Value Ref Range Status   Specimen Description BLOOD BLOOD RIGHT FOREARM  Final   Special Requests   Final    BOTTLES DRAWN AEROBIC ONLY Blood Culture results may not be optimal due to an inadequate volume of blood received in culture bottles   Culture   Final    NO GROWTH 5 DAYS Performed at [facility], [address] [place], [place] [address]    Report Status  10/17/2022 FINAL  Final  Aerobic/Anaerobic Culture w Gram Stain (surgical/deep wound)     Status: None   Collection Time: 10/13/22 11:44 AM   Specimen: Abscess  Result Value Ref Range Status   Specimen Description   Final    ABSCESS Performed at [facility], [address], [place], [place] [address]    Special Requests   Final    MUSCLE ABSCESS Performed at [facility], [address] [place], [place] [address]    Gram Stain   Final    ABUNDANT WBC PRESENT, PREDOMINANTLY PMN NO ORGANISMS SEEN    Culture   Final    FEW STAPHYLOCOCCUS CAPITIS NO ANAEROBES ISOLATED Performed at [facility], [place] [address], [place],  [address]    Report Status 10/18/2022 FINAL  Final   Organism ID, Bacteria STAPHYLOCOCCUS CAPITIS  Final      Susceptibility   Staphylococcus capitis - MIC*    CIPROFLOXACIN <=0.5 SENSITIVE Sensitive     ERYTHROMYCIN >=8 RESISTANT Resistant     GENTAMICIN <=0.5 SENSITIVE Sensitive     OXACILLIN <=0.25 SENSITIVE Sensitive     TETRACYCLINE <=1 SENSITIVE Sensitive     VANCOMYCIN 1 SENSITIVE Sensitive     TRIMETH/SULFA <=10 SENSITIVE Sensitive     CLINDAMYCIN INTERMEDIATE Intermediate     RIFAMPIN <=0.5 SENSITIVE Sensitive     Inducible Clindamycin NEGATIVE Sensitive     * FEW STAPHYLOCOCCUS CAPITIS    Coagulation Studies: No results for input(s): "LABPROT", "INR" in the last 72 hours.  Urinalysis: No results for input(s): "COLORURINE", "LABSPEC", "PHURINE", "GLUCOSEU", "HGBUR", "BILIRUBINUR", "KETONESUR", "PROTEINUR", "UROBILINOGEN", "NITRITE", "LEUKOCYTESUR" in the last 72 hours.  Invalid input(s): "APPERANCEUR"    Imaging: No results found.   Medications:    anticoagulant sodium citrate      ceFAZolin (ANCEF) IV Stopped (10/22/22 2147)   methocarbamol (ROBAXIN) IV Stopped (10/22/22 2055)    (feeding supplement) PROSource Plus  30 mL Oral BID BM   acetaminophen  1,000 mg Oral Q8H   atorvastatin  80 mg Oral QHS    Chlorhexidine Gluconate Cloth  6 each Topical Q0600   epoetin (EPOGEN/PROCRIT) injection  10,000 Units Intravenous Q M,W,F-HD   feeding supplement (NEPRO CARB STEADY)  237 mL Oral TID BM   heparin injection (subcutaneous)  5,000 Units Subcutaneous Q8H   levETIRAcetam  1,000 mg Oral Daily   levETIRAcetam  250 mg Oral Q M,W,F   LORazepam  1 mg Oral Once per day on Mon Wed Fri   multivitamin  1 tablet Oral QHS   nystatin  5 mL Oral QID   pantoprazole  20 mg Oral BID   QUEtiapine  50 mg Oral QHS   sertraline  25 mg Oral Daily   sodium chloride flush  10-40 mL Intracatheter Q12H   umeclidinium-vilanterol  1 puff Inhalation Daily   alteplase, anticoagulant sodium citrate, heparin, hydrALAZINE, HYDROmorphone, lidocaine (PF), lidocaine-prilocaine, LORazepam, methocarbamol (ROBAXIN) IV, pentafluoroprop-tetrafluoroeth, senna-docusate, sodium chloride flush  Assessment/ Plan:  [name] is a 72 y.o.  female with end stage renal disease on hemodialysis, coronary artery disease, COPD with intermittent home O2, hypertension, hyperlipidemia, stroke, diabetes mellitus type II, and breast cancer who was admitted to [facility] on 10/12/2022 for Back pain [M54.9] Bilateral leg pain [M79.604, M79.605]  Patient was found to have vertebral osteomyelitis and psoas abscess. Cultures positive for staph capitis  [facility] MWF Left AVG 102.5kg  End-stage renal disease on hemodialysis. Resumed on MWF schedule. Next treatment for Monday - Plan on no sedatives including lorazepam before dialysis treatment.   2. Anemia of chronic kidney disease Normocytic Lab Results  Component Value Date   HGB 9.6 (L) 10/22/2022  Patient receives Mircera at outpatient clinic.  EPO ordered.   3. Secondary Hyperparathyroidism: with outpatient labs: PTH 180 (3/11), phosphorus 5.4, calcium 9.3   Lab Results  Component Value Date   PTH 131 (H) 03/14/2018   CALCIUM 9.9 10/22/2022   CAION 1.09 (L)  08/28/2022   PHOS 5.4 (H) 09/09/2022  Calcium and phosphorus within desired range. Not currently on binders or vitamin D agent.   4.  Hypotension on hemodialysis treatment. Holding home regimen of losartan, metoprolol and torsemide.   5. Diabetes mellitus type II with chronic kidney disease/renal manifestations: insulin dependent. Home regimen includes Novolin 70/30. Most recent hemoglobin A1c is 5.2% on admission.   6. Vertebral osteomyelitis, seen on CT lumber spine. Neurosurgery feels patient is not a surgical candidate CT guided aspiration of psoas abscess on 10/13/22. Cultures positive for Staph Capitis.   - Cefazolin with hemodialysis for 8 weeks. Appreciate Infectious Disease input.    LOS: 11 Keyan Folson 3/31/202412:16 PM

## 2022-10-23 NOTE — Progress Notes (Signed)
Physical Therapy Treatment Patient Details Name: Cynthia Dean MRN: [id number] DOB: 02-Dec-1950 Today's Date: 10/23/2022   History of Present Illness Cynthia Dean is a 72 y.o. 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 She presents to the ED with worsening lower extremity pain.    PT Comments    Pt received in bed alert and pleasant agreeable to PT/OT co-interventions. Pt c/o 9/10 pain in L/S and RLE>LLE. Impaired sensation and mild to mod foot drop noted in RLE. MMT in RLE is 1+/5 and LLE 3-/5. Pt performed rolling x 2 with Min assist, Supine to sit with Mod of 2 and sit to supine with max of 1 to BLE and Mod of 1 to trunk. Pt sat on the EOB for 2 mins with L sided lean and min assist to of 1 to maintain balance. Pt made comfortable in bed with max of 2. PT provided AAROM, isometrics to RLE and AROM to LLE. PT provided written instructions on white board for the pt to perf. Exs every 2 hours. Pt able to read and recall the exs. Continue with Current POC and it remains appropriate.    Recommendations for follow up therapy are one component of a multi-disciplinary discharge planning process, led by the attending physician.  Recommendations may be updated based on patient status, additional functional criteria and insurance authorization.  Follow Up Recommendations  Can patient physically be transported by private vehicle: No    Assistance Recommended at Discharge Intermittent Supervision/Assistance  Patient can return home with the following Two people to help with walking and/or transfers;A lot of help with bathing/dressing/bathroom;Assistance with cooking/housework;Direct supervision/assist for medications management;Direct supervision/assist for financial management;Assist for transportation;Help with stairs or ramp for entrance   Equipment  Recommendations  None recommended by PT    Recommendations for Other Services       Precautions / Restrictions Precautions Precautions: Fall Restrictions Weight Bearing Restrictions: No     Mobility  Bed Mobility Overal bed mobility: Needs Assistance Bed Mobility: Supine to Sit, Sit to Supine     Supine to sit: Mod assist, +2 for physical assistance Sit to supine: Mod assist, +2 for physical assistance (max to BLE.)   General bed mobility comments: Pt is weak in RLE and increased pain in L/S with movement  increases need for assitacne    Transfers Overall transfer level: Needs assistance                 General transfer comment: deferred 2/2 pain an dsever wekaness in RLE.    Ambulation/Gait               General Gait Details: deferres pt not appropriate.   Stairs             Wheelchair Mobility    Modified Rankin (Stroke Patients Only)       Balance Overall balance assessment: Needs assistance Sitting-balance support: Bilateral upper extremity supported, Feet supported Sitting balance-Leahy Scale: Poor Sitting balance - Comments: Pt unable to tol sitting 2/2 topain in RLE <LLE Postural control: Left lateral lean     Standing balance comment: deferred.                            Cognition Arousal/Alertness: Awake/alert Behavior During Therapy: WFL for tasks assessed/performed Overall Cognitive Status: Impaired/Different from baseline Area of Impairment: Memory, Problem solving  Problem Solving: Slow processing General Comments: aware of self and place. slow porcessing. and able ot follow one step commands.        Exercises General Exercises - Lower Extremity Ankle Circles/Pumps: AROM, 10 reps, Both, Supine Quad Sets: Right, 10 reps, Supine Gluteal Sets: AROM, Both, 10 reps, Supine Heel Slides: AROM, AAROM, Both, 10 reps, Supine Other Exercises Other Exercises: Bridges 1 x 10  reps in supine.    General Comments        Pertinent Vitals/Pain Pain Assessment Pain Assessment: 0-10 Pain Score: 9  Pain Location: BLE R>L    Home Living                          Prior Function            PT Goals (current goals can now be found in the care plan section) Acute Rehab PT Goals PT Goal Formulation: Patient unable to participate in goal setting Time For Goal Achievement: 11/06/22 Progress towards PT goals: Progressing toward goals    Frequency    Min 2X/week      PT Plan Current plan remains appropriate    Co-evaluation PT/OT/SLP Co-Evaluation/Treatment: Yes Reason for Co-Treatment: Complexity of the patient's impairments (multi-system involvement);For patient/therapist safety PT goals addressed during session: Mobility/safety with mobility;Strengthening/ROM        AM-PAC PT "6 Clicks" Mobility   Outcome Measure  Help needed turning from your back to your side while in a flat bed without using bedrails?: A Little Help needed moving from lying on your back to sitting on the side of a flat bed without using bedrails?: A Lot Help needed moving to and from a bed to a chair (including a wheelchair)?: A Lot Help needed standing up from a chair using your arms (e.g., wheelchair or bedside chair)?: Total Help needed to walk in hospital room?: Total Help needed climbing 3-5 steps with a railing? : Total 6 Click Score: 10    End of Session Equipment Utilized During Treatment: Gait belt Activity Tolerance: Patient tolerated treatment well;Patient limited by pain Patient left: in bed;with call bell/phone within reach;with bed alarm set Nurse Communication: Mobility status PT Visit Diagnosis: Unsteadiness on feet (R26.81);Other abnormalities of gait and mobility (R26.89);Muscle weakness (generalized) (M62.81);Difficulty in walking, not elsewhere classified (R26.2)     Time: [id number] PT Time Calculation (min) (ACUTE ONLY): 24 min  Charges:   $Therapeutic Activity: 8-22 mins                     Joaquin Music PT DPT 1:57 PM,10/23/22

## 2022-10-23 NOTE — Progress Notes (Signed)
Occupational Therapy Treatment Patient Details Name: Cynthia Dean MRN: [id number] DOB: 04/10/1951 Today's Date: 10/23/2022   History of present illness Cynthia Dean is a 72 y.o. 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 She presents to the ED with worsening lower extremity pain.   OT comments  Patient agreeable to OT/PT co-treatment to maximize safety and participation. Tx session targeted improving functional balance during ADL tasks in preparation for improved functional transfers and ADL task completion. Pt required Mod A +2 to come to EOB this date. Once sitting EOB, pt engaged in seated grooming tasks. Pt fatigued quickly. OT noted L lateral lean with pt attempting to off-weight RLE due to pain (9/10). Min A and multimodal cues required to correct posture. Pt deferred further self-care tasks and requested to lay back down. Pt left supine in bed with PT present. Pt is making progress toward goal completion. D/C recommendation remains appropriate. OT will continue to follow acutely.    Recommendations for follow up therapy are one component of a multi-disciplinary discharge planning process, led by the attending physician.  Recommendations may be updated based on patient status, additional functional criteria and insurance authorization.    Assistance Recommended at Discharge Frequent or constant Supervision/Assistance  Patient can return home with the following  Two people to help with walking and/or transfers;Two people to help with bathing/dressing/bathroom;Help with stairs or ramp for entrance   Equipment Recommendations  None recommended by OT    Recommendations for Other Services      Precautions / Restrictions Precautions Precautions: Fall Restrictions Weight Bearing Restrictions: No       Mobility Bed Mobility Overal bed  mobility: Needs Assistance Bed Mobility: Supine to Sit, Sit to Supine     Supine to sit: Mod assist, +2 for physical assistance Sit to supine: Mod assist, +2 for physical assistance        Transfers                   General transfer comment: deferred 2/2 pain an severe wekaness in RLE     Balance Overall balance assessment: Needs assistance Sitting-balance support: Feet supported, Single extremity supported Sitting balance-Leahy Scale: Poor Sitting balance - Comments: Pt leaning to L side in order to take weight off R leg, unable to tolerate 2/2 RLE pain Postural control: Left lateral lean         ADL either performed or assessed with clinical judgement   ADL Overall ADL's : Needs assistance/impaired     Grooming: Set up;Min guard;Sitting;Wash/dry face         General ADL Comments: Activity tolerance limited due to 8/10 RLE pain this date. Pt requesting to lay back down after completing face washing.    Extremity/Trunk Assessment Upper Extremity Assessment Upper Extremity Assessment: Generalized weakness   Lower Extremity Assessment Lower Extremity Assessment: Generalized weakness        Vision       Perception     Praxis      Cognition Arousal/Alertness: Awake/alert Behavior During Therapy: WFL for tasks assessed/performed Overall Cognitive Status: No family/caregiver present to determine baseline cognitive functioning       General Comments: Oriented to self and "hospital". Slow processing, overall decreased general/safety awareness, able to follow single step commands        Exercises General Exercises - Upper Extremity Shoulder Flexion: AROM, Both, 10 reps, Supine Elbow Flexion: AROM, Both,  10 reps, Supine Elbow Extension: AROM, Both, 10 reps, Supine    Shoulder Instructions       General Comments      Pertinent Vitals/ Pain       Pain Assessment Pain Assessment: 0-10 Pain Score: 9  Pain Location: BLE R>L Pain Descriptors /  Indicators: Grimacing, Guarding Pain Intervention(s): Limited activity within patient's tolerance, Monitored during session  Home Living              Prior Functioning/Environment              Frequency  Min 1X/week        Progress Toward Goals  OT Goals(current goals can now be found in the care plan section)  Progress towards OT goals: OT to reassess next treatment   Acute Rehab OT Goals Patient Stated Goal: reduce pain OT Goal Formulation: Patient unable to participate in goal setting Time For Goal Achievement: 10/31/22 Potential to Achieve Goals: [facility] Discharge plan remains appropriate;Frequency remains appropriate    Co-evaluation    PT/OT/SLP Co-Evaluation/Treatment: Yes Reason for Co-Treatment: Complexity of the patient's impairments (multi-system involvement);For patient/therapist safety PT goals addressed during session: Mobility/safety with mobility;Strengthening/ROM OT goals addressed during session: ADL's and self-care;Strengthening/ROM      AM-PAC OT "6 Clicks" Daily Activity     Outcome Measure   Help from another person eating meals?: A Little Help from another person taking care of personal grooming?: A Little Help from another person toileting, which includes using toliet, bedpan, or urinal?: A Lot Help from another person bathing (including washing, rinsing, drying)?: A Lot Help from another person to put on and taking off regular upper body clothing?: A Lot Help from another person to put on and taking off regular lower body clothing?: A Lot 6 Click Score: 14    End of Session    OT Visit Diagnosis: Other abnormalities of gait and mobility (R26.89);Muscle weakness (generalized) (M62.81)   Activity Tolerance Patient limited by pain   Patient Left in bed;with call bell/phone within reach;with bed alarm set;Other (comment) (left supine in bed with PT present)   Nurse Communication Mobility status        Time: 1126-1140 OT  Time Calculation (min): 14 min  Charges: OT General Charges $OT Visit: 1 Visit OT Treatments $Self Care/Home Management : 8-22 mins  [facility] MS, OTR/L ascom [phone]  10/23/22, 2:45 PM

## 2022-10-23 NOTE — Progress Notes (Signed)
PROGRESS NOTE    Cynthia Dean  [id number] DOB: 07/10/1951 DOA: 10/12/2022 PCP: Lowella Bandy, MD  [id number]  LOS: 11 days   Brief hospital course:   Assessment & Plan: 72 y/o F w/ PMH of ESRD on HD, HLD, COPD, DM2, HTN, depression, CVA who presents w/ back pain x 3 weeks.   Pt presented w/ back pain and was found to intradiscal abscess/lumbar discitis/b/l psoas abscess on MRI. Pt was initially put on IV vanco & cefepime. Abxs changed to IV cefazolin & vanco was continued as per ID. Wound cx growing staph capitis. Will need 6-8 weeks of IV abxs as per ID.  Not a good surgical candidate as per neuro surg. Of note, pt was been very confused/agitated and pt even refused HD on 10/17/22 & 10/17/32.   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  ID consulted.  Abx de-escalated to cefazolin. --cont cefazolin, for 6 weeks, End Date: 12/02/22   Confusion and agitation Hospital delirium --mental status started to improve today --cont seroquel 50 mg nightly (new)  ESRD: on HD MWF.  Pt refused HD on 10/17/22, 10/18/22 and 3/27. --palliative consulted, currently family requests full scope of treatment.   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 H&H are stable    Hypokalemia:  managed w/ HD    Hx of seizures:  continue on home dose of keppra   HLD:  continue on statin    Hx of COPD:  w/o exacerbation.  Continue on bronchodilators    DM2: well controlled HbA1c 5.2.  --d/c'ed BG checks   Peripheral neuropathy:  likely secondary to DM2.  Continue on home dose of gabapentin    HTN:  --hold losartan and Toprol 2/2 hypotension   GERD:  continue on PPI    Depression: severity unknown.  Continue on home dose of sertraline     Overweight: BMI 28.2.  Would benefit from  weight loss    DVT prophylaxis: Heparin SQ Code Status: Full code  Family Communication:  Level of care: Telemetry Medical Dispo:   The patient is from: home Anticipated d/c is to: SNF Anticipated d/c date is: whenever bed available   Subjective and Interval History:  Mental status improving.  Complained of leg pain during therapy session.   Objective: Vitals:   10/23/22 0502 10/23/22 0833 10/23/22 1158 10/23/22 1742  BP: (!) 143/80 (!) 124/50 (!) 148/53 136/78  Pulse: 100 90 89 (!) 101  Resp: 18 16 16 17   Temp: (!) 97.3 F (36.3 C) 97.7 F (36.5 C) 98.2 F (36.8 C) 98.2 F (36.8 C)  TempSrc: Oral     SpO2: 97% 93% 93% 96%  Weight:      Height:        Intake/Output Summary (Last 24 hours) at 10/23/2022 1806 Last data filed at 10/23/2022 [id number] Gross per 24 hour  Intake 155 ml  Output --  Net 155 ml   Filed Weights   10/20/22 1413 10/21/22 0820 10/21/22 1223  Weight: 93.6 kg 94.2 kg 95.4 kg    Examination:   Constitutional: NAD, alert, oriented to person HEENT: conjunctivae and lids normal, EOMI CV: No cyanosis.   RESP: normal respiratory effort, on RA   Data Reviewed: I have personally reviewed labs and imaging studies  Time spent:  25 minutes  Enzo Bi, MD [facility] If 7PM-7AM, please contact night-coverage 10/23/2022, 6:06 PM

## 2022-10-24 DIAGNOSIS — M4646 Discitis, unspecified, lumbar region: Secondary | ICD-10-CM | POA: Diagnosis not present

## 2022-10-24 DIAGNOSIS — M5441 Lumbago with sciatica, right side: Secondary | ICD-10-CM | POA: Diagnosis not present

## 2022-10-24 DIAGNOSIS — K6812 Psoas muscle abscess: Secondary | ICD-10-CM | POA: Diagnosis not present

## 2022-10-24 DIAGNOSIS — D649 Anemia, unspecified: Secondary | ICD-10-CM | POA: Diagnosis not present

## 2022-10-24 DIAGNOSIS — M5442 Lumbago with sciatica, left side: Secondary | ICD-10-CM | POA: Diagnosis not present

## 2022-10-24 DIAGNOSIS — M79604 Pain in right leg: Secondary | ICD-10-CM

## 2022-10-24 DIAGNOSIS — M79605 Pain in left leg: Secondary | ICD-10-CM

## 2022-10-24 LAB — RENAL FUNCTION PANEL
Albumin: 2.4 g/dL — ABNORMAL LOW (ref 3.5–5.0)
Anion gap: 12 (ref 5–15)
BUN: 36 mg/dL — ABNORMAL HIGH (ref 8–23)
CO2: 25 mmol/L (ref 22–32)
Calcium: 10.1 mg/dL (ref 8.9–10.3)
Chloride: 100 mmol/L (ref 98–111)
Creatinine, Ser: 8.27 mg/dL — ABNORMAL HIGH (ref 0.44–1.00)
GFR, Estimated: 5 mL/min — ABNORMAL LOW (ref >60)
Glucose, Bld: 172 mg/dL — ABNORMAL HIGH (ref 70–99)
Phosphorus: 4.2 mg/dL (ref 2.5–4.6)
Potassium: 3.1 mmol/L — ABNORMAL LOW (ref 3.5–5.1)
Sodium: 137 mmol/L (ref 135–145)

## 2022-10-24 LAB — CBC
HCT: 32.4 % — ABNORMAL LOW (ref 36.0–46.0)
Hemoglobin: 9.6 g/dL — ABNORMAL LOW (ref 12.0–15.0)
MCH: 24 pg — ABNORMAL LOW (ref 26.0–34.0)
MCHC: 29.6 g/dL — ABNORMAL LOW (ref 30.0–36.0)
MCV: 81 fL (ref 80.0–100.0)
Platelets: 192 10*3/uL (ref 150–400)
RBC: 4 MIL/uL (ref 3.87–5.11)
RDW: 21 % — ABNORMAL HIGH (ref 11.5–15.5)
WBC: 8.3 10*3/uL (ref 4.0–10.5)
nRBC: 0 % (ref 0.0–0.2)

## 2022-10-24 LAB — MAGNESIUM: Magnesium: 2 mg/dL (ref 1.7–2.4)

## 2022-10-24 LAB — GLUCOSE, CAPILLARY
Glucose-Capillary: 148 mg/dL — ABNORMAL HIGH (ref 70–99)
Glucose-Capillary: 130 mg/dL — ABNORMAL HIGH (ref 70–99)

## 2022-10-24 MED ORDER — POTASSIUM CHLORIDE CRYS ER 20 MEQ PO TBCR
40.0000 meq | EXTENDED_RELEASE_TABLET | Freq: Once | ORAL | Status: AC
  Administered 2022-10-24: 40 meq via ORAL
  Filled 2022-10-24: qty 2

## 2022-10-24 NOTE — Progress Notes (Signed)
PROGRESS NOTE    Cynthia Dean  [id number] DOB: 1950/12/12 DOA: 10/12/2022 PCP: Lowella Bandy, MD  [id number]  LOS: 12 days   Brief hospital course:   Assessment & Plan: 72 y/o F w/ PMH of ESRD on HD, HLD, COPD, DM2, HTN, depression, CVA who presents w/ back pain x 3 weeks.   Pt presented w/ back pain and was found to intradiscal abscess/lumbar discitis/b/l psoas abscess on MRI. Pt was initially put on IV vanco & cefepime. Abxs changed to IV cefazolin & vanco was continued as per ID. Wound cx growing staph capitis. Will need 6-8 weeks of IV abxs as per ID.  Not a good surgical candidate as per neuro surg. Of note, pt was been very confused/agitated and pt even refused HD on 10/17/22 & 10/17/32.   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  ID consulted.  Abx de-escalated to cefazolin. --cont cefazolin, for 6 weeks, End Date: 12/02/22   Confusion and agitation Hospital delirium --mental status started to improve  --cont seroquel 50 mg nightly (new)  ESRD: on HD MWF.  Pt refused HD on 10/17/22, 10/18/22 and 3/27, 4/1 --palliative consulted, currently family requests full scope of treatment.   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 H&H are stable    Hypokalemia:  --monitor and replete PRN with oral   Hx of seizures:  continue on home dose of keppra   HLD:  continue on statin    Hx of COPD:  w/o exacerbation.  Continue on bronchodilators    DM2: well controlled HbA1c 5.2.  --d/c'ed BG checks   Peripheral neuropathy:  likely secondary to DM2.  Continue on home dose of gabapentin    HTN:  --hold losartan and Toprol 2/2 hypotension   GERD:  continue on PPI    Depression: severity unknown.  Continue on home dose of sertraline      Overweight: BMI 28.2.  Would benefit from weight loss    DVT prophylaxis: Heparin SQ Code Status: Full code  Family Communication: boyfriend updated at bedside today Level of care: Telemetry Medical Dispo:   The patient is from: home Anticipated d/c is to: SNF Anticipated d/c date is: undetermined   Subjective and Interval History:  Pt reported severe right foot pain not relieved with oral dilaudid, however, palpating, extension and flexion of right foot did not elicit much pain reaction.  Pt refused dialysis again today.     Objective: Vitals:   10/24/22 0700 10/24/22 1200 10/24/22 1649 10/24/22 1943  BP: 138/88 134/70 (!) 119/58 132/73  Pulse: 96 97 96 98  Resp: 18 18 17 18   Temp: 97.9 F (36.6 C) 98.6 F (37 C) 98.6 F (37 C) 98.3 F (36.8 C)  TempSrc: Oral Oral  Oral  SpO2: 95% 90% 92% 94%  Weight:      Height:        Intake/Output Summary (Last 24 hours) at 10/24/2022 2201 Last data filed at 10/24/2022 [id number] Gross per 24 hour  Intake 50 ml  Output --  Net 50 ml   Filed Weights   10/20/22 1413 10/21/22 0820 10/21/22 1223  Weight: 93.6 kg 94.2 kg 95.4 kg    Examination:   Constitutional: NAD, alert, oriented to person and hospital HEENT: conjunctivae and lids normal,  EOMI CV: No cyanosis.   RESP: normal respiratory effort, on RA SKIN: warm, dry   Data Reviewed: I have personally reviewed labs and imaging studies  Time spent: 35 minutes  Enzo Bi, MD [facility] If 7PM-7AM, please contact night-coverage 10/24/2022, 10:01 PM

## 2022-10-24 NOTE — Progress Notes (Signed)
Date of Admission:  10/12/2022      ID: Cynthia Dean is a 72 y.o. female  Principal Problem:   Back pain Active Problems:   Essential hypertension   Seizure disorder   Diabetic retinopathy without macular edema associated with type 2 diabetes mellitus   GERD (gastroesophageal reflux disease)   Dyslipidemia   Diabetic neuropathy   Anemia of chronic disease   ESRD on dialysis   Anemia   Insulin-requiring or dependent type II diabetes mellitus   Lumbar discitis   Myositis   Psoas abscess Medical history 03/04/22 Ca breast lumpectomy, TAD 8/26-9/8 Hospitalization for fatigue weakness- diagnosed with pulmonary embolism, Staph capitis bacteremia and left breast fluid collection which initially  was thought to be an abscess but on aspiration was old blood and likely a seroma with negative fluid culture She was treated with 2 weeks of appropriate Iv antibiotic for the staph capitis until 04/03/22.  She had been to the ED and admitted many times after that Oct 2023- Back pain and rt sciatica- MRI lumbar spine done on 05/11/22 Degenerative disc disease.  Pt did not want to pursue chemo or radiation for the breast cancer ( ER-, PR 20%, HER 2 positive)  She was hospitalized in Feb 2024 for severe anemia -Hb 5.4- GI work up negative except for diverticulosis ( not bleeding on the day of the scope)  Back again with back pain and b/l radiation of pain down the leg  MRI this time showed b/l lumbar abscess, L4-L5 discitis, b/l psoas abscess  Aspiration of the rt psoas abscess on 10/13/22 is positive for staph capitis   Partner at bed side Subjective: Patient states she is feeling better Back pain is better She is talking to her friends at bedside. She refused dialysis again today.  Medications:   (feeding supplement) PROSource Plus  30 mL Oral BID BM   acetaminophen  1,000 mg Oral Q8H   atorvastatin  80 mg Oral QHS   Chlorhexidine Gluconate Cloth  6 each Topical Q0600    epoetin (EPOGEN/PROCRIT) injection  10,000 Units Intravenous Q M,W,F-HD   feeding supplement (NEPRO CARB STEADY)  237 mL Oral TID BM   heparin injection (subcutaneous)  5,000 Units Subcutaneous Q8H   levETIRAcetam  1,000 mg Oral Daily   levETIRAcetam  250 mg Oral Q M,W,F   LORazepam  1 mg Oral Once per day on Mon Wed Fri   multivitamin  1 tablet Oral QHS   nystatin  5 mL Oral QID   pantoprazole  20 mg Oral BID   QUEtiapine  50 mg Oral QHS   sertraline  25 mg Oral Daily   sodium chloride flush  10-40 mL Intracatheter Q12H   umeclidinium-vilanterol  1 puff Inhalation Daily    Objective: Vital signs in last 24 hours: Patient Vitals for the past 24 hrs:  BP Temp Temp src Pulse Resp SpO2  10/24/22 1943 132/73 98.3 F (36.8 C) Oral 98 18 94 %  10/24/22 1649 (!) 119/58 98.6 F (37 C) -- 96 17 92 %  10/24/22 1200 134/70 98.6 F (37 C) Oral 97 18 90 %  10/24/22 0700 138/88 97.9 F (36.6 C) Oral 96 18 95 %  10/24/22 0558 (!) 102/38 97.9 F (36.6 C) Oral 91 19 92 %       PHYSICAL EXAM:  General: awake, calm Some expressive dysphasia  Lungs: b/l air entry. Heart: s1s2 Abdomen: Soft, Extremities: left AVG site -okay Skin: No rashes or lesions. Or  bruising  CNS rt lower extremity movement less compared to the rest  Lab Results    Latest Ref Rng & Units 10/24/2022    4:45 AM 10/22/2022    5:30 AM 10/21/2022    3:58 AM  CBC  WBC 4.0 - 10.5 K/uL 8.3  6.7  7.3   Hemoglobin 12.0 - 15.0 g/dL 9.6  9.6  9.4   Hematocrit 36.0 - 46.0 % 32.4  32.5  32.2   Platelets 150 - 400 K/uL 192  142  154        Latest Ref Rng & Units 10/24/2022    4:45 AM 10/22/2022    5:30 AM 10/21/2022    3:58 AM  CMP  Glucose 70 - 99 mg/dL 172  123  106   BUN 8 - 23 mg/dL 36  19  28   Creatinine 0.44 - 1.00 mg/dL 8.27  5.14  7.05   Sodium 135 - 145 mmol/L 137  137  137   Potassium 3.5 - 5.1 mmol/L 3.1  3.4  2.8   Chloride 98 - 111 mmol/L 100  100  99   CO2 22 - 32 mmol/L 25  27  28    Calcium 8.9 - 10.3  mg/dL 10.1  9.9  9.2     Erythrocyte Sedimentation Rate     Component Value Date/Time   ESRSEDRATE 70 (H) 10/13/2022 0006      Microbiology: 10/12/22 BC- NG so far 3/21 psoas abscess- staph capitis    Assessment/Plan:  Lumbar discitis,L4-L5  epidural phlegmon and b/l psoas abscess on MRI Underwent IR aspiration of psoas abscess on 10/13/22 Vanco and cefepime stated on 10/13/22 after procedure Gram stain numerous wbc no organism Culture positive for staph capitis a coag neg bacteria Blood culture ng On 03/19/22 she had 4/4 blood culture positive for staph capitis At that time she had left breast swelling , induration initially thought to be an abscess post lumpectomy on 03/04/22 for malignancy. US done showed complicated cystic lesion measuring 14.5 x 6.3 x 9.2 cm. Left breast. IR aspirated 135 ml of old blood from the left breast and culture was negative She was then treated with a total of 2 weeks of appropriate antibiotic for the pan sensitive staph capitis ( Iv ceftriaxone, vanco and then cefazolin) The 2 staph capitis have slightly different susceptibility pattern- the one in Aug from blood was pan sensitive and the one from the abscess has resistance to clinda and erythromycin  Is the lumbar infection indolent from Aug bacteremia, or is it from a new infection because she is at risk for one due to ESRD, being on dialysis and having DDD Or did she have endocarditis and seeded the spine. Last echo from 03/22/22 did not show any valve vegetations. It was repeated on 3/27 and shows no vegetation Blood culture this time negative   DC vanco and started cefazolin Will now need  cefazolin6 weeks- IV given during dilaysis- OPAT order placed   Confusion/agitation- resolved Could be medication- Opioids/cefepime   ESRD on dialysis Recent malfunctioning AVG and underwent fistulogram, angioplasty and stent placement on 09/22/22 She refuses dialysis intermittently     Left breast  Carcinoma- s/p  lumpectomy and targeted axillary dissection She did not want to take chemo/radiation    Anemia   DM -    HTN on metoprolol  H/o Pulmonary embolism H/o CVA  Discussed the management with patient and her partner at bed side

## 2022-10-24 NOTE — Progress Notes (Signed)
Palliative-  Chart reviewed including labs, progress notes, imaging from this and previous encounters.  Patient was not present when I attempted to see her.  Will attempt follow-up again tomorrow.   Mariana Kaufman, AGNP-C Palliative Medicine  No charge

## 2022-10-24 NOTE — Progress Notes (Signed)
1020: Patient came in for HD today. She looks calm. As soon as we explained that she is getting her dialysis today, she cried and told the staff "I don't want dialysis today" four times. She is oriented to self and situation. HD NP notified. Hand off given to her floor RN.

## 2022-10-24 NOTE — Progress Notes (Signed)
[facility]  ROUNDING NOTE   Subjective:   Cynthia Dean is a 72 y.o. 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 She presents to the ED with worsening lower extremity pain. Patient will be admitted for Back pain [M54.9] Bilateral leg pain [M79.604, M79.605]  Patient is known to our practice and receives outpatient dialysis treatments at [facility] on a MWF schedule, supervised by Dr Candiss Norse.   Received a message from HD RN that patient is refusing dialysis This provider spoke with patient.  Patient states she is tired and would like to discontinue dialysis.  Patient voices concerns of family and boyfriend excepting her decision.  Patient states she has been on dialysis for many years.  Continually states she is tired.  Objective:  Vital signs in last 24 hours:  Temp:  [97.5 F (36.4 C)-98.2 F (36.8 C)] 97.9 F (36.6 C) (04/01 0700) Pulse Rate:  [89-101] 96 (04/01 0700) Resp:  [16-19] 18 (04/01 0700) BP: (102-148)/(38-88) 138/88 (04/01 0700) SpO2:  [90 %-96 %] 95 % (04/01 0700)  Weight change:  Filed Weights   10/20/22 1413 10/21/22 0820 10/21/22 1223  Weight: 93.6 kg 94.2 kg 95.4 kg    Intake/Output: I/O last 3 completed shifts: In: 205 [IV Piggyback:205] Out: -    Intake/Output this shift:  No intake/output data recorded.  Physical Exam: General: NAD, laying in bed, tearful  Head: Normocephalic, atraumatic.  Moist oral mucosal membranes  Eyes: Anicteric  Lungs:  Clear to auscultation, normal effort  Heart: Regular rate and rhythm  Abdomen:  Soft, nontender  Extremities: no peripheral edema.  Neurologic: Alert and oriented. Slow to respond  Skin: No lesions  Access: Left AVG    Basic Metabolic Panel: Recent Labs  Lab 10/19/22 0600 10/20/22 0637 10/21/22 0358 10/22/22 0530 10/24/22 0445  NA 142 142  137 137 137  K 3.6 4.8 2.8* 3.4* 3.1*  CL 104 103 99 100 100  CO2 24 23 28 27 25   GLUCOSE 128* 104* 106* 123* 172*  BUN 47* 53* 28* 19 36*  CREATININE 10.85* 12.21* 7.05* 5.14* 8.27*  CALCIUM 10.2 9.9 9.2 9.9 10.1  MG  --  2.2 1.9 1.9 2.0  PHOS  --   --   --   --  4.2     Liver Function Tests: Recent Labs  Lab 10/24/22 0445  ALBUMIN 2.4*   No results for input(s): "LIPASE", "AMYLASE" in the last 168 hours. No results for input(s): "AMMONIA" in the last 168 hours.  CBC: Recent Labs  Lab 10/19/22 0600 10/20/22 0637 10/21/22 0358 10/22/22 0530 10/24/22 0445  WBC 7.5 6.9 7.3 6.7 8.3  HGB 9.6* 9.5* 9.4* 9.6* 9.6*  HCT 33.4* 33.1* 32.2* 32.5* 32.4*  MCV 82.7 83.0 81.7 80.4 81.0  PLT 186 177 154 142* 192     Cardiac Enzymes: No results for input(s): "CKTOTAL", "CKMB", "CKMBINDEX", "TROPONINI" in the last 168 hours.  BNP: Invalid input(s): "POCBNP"  CBG: Recent Labs  Lab 10/20/22 2113 10/21/22 1633 10/23/22 0831 10/23/22 1156 10/23/22 1740  GLUCAP 154* 90 118* 114* 161*     Microbiology: Results for orders placed or performed during the hospital encounter of 10/12/22  Blood culture (routine x 2)     Status: None   Collection Time: 10/12/22  5:40 PM   Specimen: BLOOD  Result Value Ref Range Status   Specimen Description BLOOD BLOOD RIGHT HAND  [facility]  Final   Special Requests   Final    BOTTLES DRAWN AEROBIC AND ANAEROBIC Blood Culture results may not be optimal due to an inadequate volume of blood received in culture bottles   Culture   Final    NO GROWTH 5 DAYS Performed at [facility], [address] [place], [place] [address]    Report Status 10/17/2022 FINAL  Final  Blood culture (routine x 2)     Status: None   Collection Time: 10/12/22  6:56 PM   Specimen: BLOOD  Result Value Ref Range Status   Specimen Description BLOOD BLOOD RIGHT FOREARM  Final   Special Requests   Final    BOTTLES DRAWN AEROBIC ONLY Blood Culture results may not be  optimal due to an inadequate volume of blood received in culture bottles   Culture   Final    NO GROWTH 5 DAYS Performed at [facility], [address], [place], [place] [address]    Report Status 10/17/2022 FINAL  Final  Aerobic/Anaerobic Culture w Gram Stain (surgical/deep wound)     Status: None   Collection Time: 10/13/22 11:44 AM   Specimen: Abscess  Result Value Ref Range Status   Specimen Description   Final    ABSCESS Performed at [facility], [address] [place], [place] [address]    Special Requests   Final    MUSCLE ABSCESS Performed at [facility], [address] [place], [place] [address]    Gram Stain   Final    ABUNDANT WBC PRESENT, PREDOMINANTLY PMN NO ORGANISMS SEEN    Culture   Final    FEW STAPHYLOCOCCUS CAPITIS NO ANAEROBES ISOLATED Performed at [facility], [place] [address] [place], [place] [address]    Report Status 10/18/2022 FINAL  Final   Organism ID, Bacteria STAPHYLOCOCCUS CAPITIS  Final      Susceptibility   Staphylococcus capitis - MIC*    CIPROFLOXACIN <=0.5 SENSITIVE Sensitive     ERYTHROMYCIN >=8 RESISTANT Resistant     GENTAMICIN <=0.5 SENSITIVE Sensitive     OXACILLIN <=0.25 SENSITIVE Sensitive     TETRACYCLINE <=1 SENSITIVE Sensitive     VANCOMYCIN 1 SENSITIVE Sensitive     TRIMETH/SULFA <=10 SENSITIVE Sensitive     CLINDAMYCIN INTERMEDIATE Intermediate     RIFAMPIN <=0.5 SENSITIVE Sensitive     Inducible Clindamycin NEGATIVE Sensitive     * FEW STAPHYLOCOCCUS CAPITIS    Coagulation Studies: No results for input(s): "LABPROT", "INR" in the last 72 hours.  Urinalysis: No results for input(s): "COLORURINE", "LABSPEC", "PHURINE", "GLUCOSEU", "HGBUR", "BILIRUBINUR", "KETONESUR", "PROTEINUR", "UROBILINOGEN", "NITRITE", "LEUKOCYTESUR" in the last 72 hours.  Invalid input(s): "APPERANCEUR"    Imaging: No results found.   Medications:    anticoagulant sodium citrate      ceFAZolin  (ANCEF) IV Stopped (10/23/22 2216)   methocarbamol (ROBAXIN) IV Stopped (10/22/22 2055)    (feeding supplement) PROSource Plus  30 mL Oral BID BM   acetaminophen  1,000 mg Oral Q8H   atorvastatin  80 mg Oral QHS   Chlorhexidine Gluconate Cloth  6 each Topical Q0600   epoetin (EPOGEN/PROCRIT) injection  10,000 Units Intravenous Q M,W,F-HD   feeding supplement (NEPRO CARB STEADY)  237 mL Oral TID BM   heparin injection (subcutaneous)  5,000 Units Subcutaneous Q8H   levETIRAcetam  1,000 mg Oral Daily   levETIRAcetam  250 mg Oral Q M,W,F   LORazepam  1 mg Oral Once per day on Mon  Wed Fri   multivitamin  1 tablet Oral QHS   nystatin  5 mL Oral QID   pantoprazole  20 mg Oral BID   QUEtiapine  50 mg Oral QHS   sertraline  25 mg Oral Daily   sodium chloride flush  10-40 mL Intracatheter Q12H   umeclidinium-vilanterol  1 puff Inhalation Daily   alteplase, anticoagulant sodium citrate, heparin, hydrALAZINE, HYDROmorphone, lidocaine (PF), lidocaine-prilocaine, LORazepam, methocarbamol (ROBAXIN) IV, pentafluoroprop-tetrafluoroeth, senna-docusate, sodium chloride flush  Assessment/ Plan:  Ms. Pamelia Mcgurl is a 72 y.o.  female with end stage renal disease on hemodialysis, coronary artery disease, COPD with intermittent home O2, hypertension, hyperlipidemia, stroke, diabetes mellitus type II, and breast cancer who was admitted to [facility] on 10/12/2022 for Back pain [M54.9] Bilateral leg pain [M79.604, M79.605]  Patient was found to have vertebral osteomyelitis and psoas abscess. Cultures positive for staph capitis  [facility] MWF Left AVG 102.5kg  End-stage renal disease on hemodialysis. Resumed on MWF schedule. Next treatment for Monday -Patient currently refusing scheduled dialysis treatment.  Patient alert and oriented and stated she is tired and would like to discontinue dialysis. - Would recommend palliative care to follow-up goals of care and offer family support.  2.  Anemia of chronic kidney disease Normocytic Lab Results  Component Value Date   HGB 9.6 (L) 10/24/2022  Patient receives Mircera at outpatient clinic.  EPO ordered with dialysis treatments.   3. Secondary Hyperparathyroidism: with outpatient labs: PTH 180 (3/11), phosphorus 5.4, calcium 9.3   Lab Results  Component Value Date   PTH 131 (H) 03/14/2018   CALCIUM 10.1 10/24/2022   CAION 1.09 (L) 08/28/2022   PHOS 4.2 10/24/2022  Calcium and phosphorus within desired range. Not currently on binders or vitamin D agent.   4.  Hypotension on hemodialysis treatment. Holding home regimen of losartan, metoprolol and torsemide. Blood pressure 102/38.   5. Diabetes mellitus type II with chronic kidney disease/renal manifestations: insulin dependent. Home regimen includes Novolin 70/30. Most recent hemoglobin A1c is 5.2% on admission.   Glucose acceptable   6. Vertebral osteomyelitis, seen on CT lumber spine. Neurosurgery feels patient is not a surgical candidate CT guided aspiration of psoas abscess on 10/13/22. Cultures positive for Staph Capitis.   - Cefazolin with hemodialysis for 8 weeks. Appreciate Infectious Disease input.    LOS: 12   4/1/202411:02 AM

## 2022-10-24 NOTE — Progress Notes (Addendum)
Patient c/o pain to right foot and right lower leg. Dilaudid given with no improvement. No erythema noted to right LE or foot. Possibly mild swelling to right Lower leg. Patient requested pain medication. Dr. Billie Ruddy notified of the above. No new orders.

## 2022-10-24 NOTE — Progress Notes (Signed)
Physical Therapy Treatment Patient Details Name: Cynthia Dean MRN: [id number] DOB: 07-09-1951 Today's Date: 10/24/2022   History of Present Illness Zachary Brehan Mcgivney is a 72 y.o. 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 She presents to the ED with worsening lower extremity pain.    PT Comments    Pt received upright in bed agreeable to PT services. Pt alert and more vocal compared to last time author saw pt but remains with slowed processing needing multimodal cuing for LE therex. Pt mostly relying on AAROM for RLE compared to LLE. Manual resistance provided to RLE as able for strength gains. Pt deferring further mobility as pt requesting to eat breakfast. Upon maxA to scoot up in bed with pt in trendelenburg, pt begins to cry and becomes anxious with worsening RLE pain. PT providing bed positions and pillows to improve discomfort. Pt reports improved RLE pain but still is anxious and squirming in bed due to her pain. RN student aware and plans to update RN on worsening pain post PT session. Pt with all needs in reach. Continue to heavily recommend f/u PT services (< 3 hours/day) at discharge.    Recommendations for follow up therapy are one component of a multi-disciplinary discharge planning process, led by the attending physician.  Recommendations may be updated based on patient status, additional functional criteria and insurance authorization.  Follow Up Recommendations  Can patient physically be transported by private vehicle: No    Assistance Recommended at Discharge Intermittent Supervision/Assistance  Patient can return home with the following Two people to help with walking and/or transfers;A lot of help with bathing/dressing/bathroom;Assistance with cooking/housework;Direct supervision/assist for medications management;Direct  supervision/assist for financial management;Assist for transportation;Help with stairs or ramp for entrance   Equipment Recommendations  None recommended by PT    Recommendations for Other Services       Precautions / Restrictions Precautions Precautions: Fall Restrictions Weight Bearing Restrictions: No     Mobility  Bed Mobility Overal bed mobility: Needs Assistance             General bed mobility comments: maxA with bed in trendelenburg to scoot  upright in bed. Patient Response: Cooperative, Flat affect, Anxious  Transfers                   General transfer comment: deferred due to pain, also pt wanting to eat breakfast    Ambulation/Gait                   Stairs             Wheelchair Mobility    Modified Rankin (Stroke Patients Only)       Balance                                            Cognition Arousal/Alertness: Awake/alert Behavior During Therapy: Anxious, Flat affect Overall Cognitive Status: No family/caregiver present to determine baseline cognitive functioning                                 General Comments: Pleasant and cooperative but pain exacerbated with bed mobility leading to crying and pt becoming increasinlgy anxious and agitated.        Exercises  General Exercises - Lower Extremity Ankle Circles/Pumps: AROM, 10 reps, Both, Supine Quad Sets: Right, 10 reps, Supine Short Arc Quad: AROM, Strengthening, Left, 10 reps, AAROM, Right, Supine Heel Slides: AROM, AAROM, Both, 10 reps, Supine, Strengthening Hip ABduction/ADduction: AAROM, Strengthening, Right, 10 reps, Supine    General Comments        Pertinent Vitals/Pain Pain Assessment Pain Assessment: Faces Faces Pain Scale: Hurts whole lot Pain Location: RLE Pain Descriptors / Indicators: Grimacing, Guarding, Crying Pain Intervention(s): Limited activity within patient's tolerance, Monitored during session,  Repositioned, Patient requesting pain meds-RN notified    Home Living                          Prior Function            PT Goals (current goals can now be found in the care plan section) Acute Rehab PT Goals PT Goal Formulation: Patient unable to participate in goal setting    Frequency    Min 2X/week      PT Plan Current plan remains appropriate    Co-evaluation              AM-PAC PT "6 Clicks" Mobility   Outcome Measure  Help needed turning from your back to your side while in a flat bed without using bedrails?: A Lot Help needed moving from lying on your back to sitting on the side of a flat bed without using bedrails?: A Lot Help needed moving to and from a bed to a chair (including a wheelchair)?: A Lot Help needed standing up from a chair using your arms (e.g., wheelchair or bedside chair)?: Total Help needed to walk in hospital room?: Total Help needed climbing 3-5 steps with a railing? : Total 6 Click Score: 9    End of Session Equipment Utilized During Treatment: Gait belt Activity Tolerance: Patient limited by pain Patient left: in bed;with call bell/phone within reach;with bed alarm set Nurse Communication: Mobility status PT Visit Diagnosis: Unsteadiness on feet (R26.81);Other abnormalities of gait and mobility (R26.89);Muscle weakness (generalized) (M62.81);Difficulty in walking, not elsewhere classified (R26.2)     Time: [id number] PT Time Calculation (min) (ACUTE ONLY): 19 min  Charges:  $Therapeutic Exercise: 8-22 mins                    Salem Caster. Fairly IV, PT, DPT Physical Therapist- [facility]  10/24/2022, 9:56 AM

## 2022-10-24 NOTE — Discharge Planning (Addendum)
Update: Spoke with patient this morning while down for dialysis. Patient and I discussed how long she has been on dialysis and how tired she is and ready to stop. Patient expressed concerns of making her boyfriend (Mr. Gilford Rile) mad because she is ready to stop dialysis. We dicussed a conversation between me and her daughter [name]. [name] stated wanting whatever her mother wanted, and she was at peace with her stopping dialysis. I informed patient I would call [name] to inform her of this discussion and patient stated she would like that very much.  It appears patient is ready to sign off of dialysis, however is concerned of families acceptance of her decision.   Continuing to follow patient for safe discharge to outpatient dialysis. Due to status this hospitalization, patient will need to tolerate sitting in a recliner for dialysis prior to discharge.

## 2022-10-25 DIAGNOSIS — M5441 Lumbago with sciatica, right side: Secondary | ICD-10-CM | POA: Diagnosis not present

## 2022-10-25 DIAGNOSIS — M4646 Discitis, unspecified, lumbar region: Secondary | ICD-10-CM | POA: Diagnosis not present

## 2022-10-25 DIAGNOSIS — N186 End stage renal disease: Secondary | ICD-10-CM | POA: Diagnosis not present

## 2022-10-25 DIAGNOSIS — Z7189 Other specified counseling: Secondary | ICD-10-CM

## 2022-10-25 DIAGNOSIS — Z992 Dependence on renal dialysis: Secondary | ICD-10-CM | POA: Diagnosis not present

## 2022-10-25 DIAGNOSIS — M5442 Lumbago with sciatica, left side: Secondary | ICD-10-CM | POA: Diagnosis not present

## 2022-10-25 NOTE — Progress Notes (Addendum)
Daily Progress Note   Patient Name: Cynthia Dean       Date: 10/25/2022 DOB: 1951/05/06  Age: 72 y.o. MRN#: [id number] Attending Physician: Enzo Bi, MD Primary Care Physician: Lowella Bandy, MD Admit Date: 10/12/2022  Reason for Consultation/Follow-up: Establishing goals of care  Patient Profile/HPI:   72 y.o. female with history of ESRD on HD admitted on 3/20/2024with c/o back pain. Workup reveals lumbar discitis L4-L5. She has been unable to walk for approximately 3 weeks. Being treated with antibiotics. Palliative medicine consulted for [place].   Subjective:  Chart reviewed including labs, progress notes, imaging from this and previous encounters.  Initial Palliative consult completed on 10/19/22 by Wadie Lessen, NP.  In the interim Ms. Klose has expressed her desire to multiple providers to stop dialysis.  There is some question of her capacity as well.  There is also conflict expressed that patient desires to stop dialysis, but family directs her to continue.   I saw patient to establish capacity and further discuss goals of care if she is able.   Met with patient, her boyfriend [name] was at the bedside.    The main determinant of capacity is cognition. Capacity describes a person's ability to make a decision.  Medical capacity refers to the ability to utilize information regarding a  proposed treatment option to make a choice that is congruent with one's own values and preferences.    Capacity can wax and wane from one moment to another and can vary based on the decision being made.   Mental Capacity Assessment: I have evaluated the following areas to assess the Anna-Marie Baumbach Graves's mental capacity regarding medical decision-making ability which pertains to  competency to accept or refuse medical treatment.   The specific treatment or service in question is: dialysis treatment    Communication: The patient was unable to clearly state preferred treatment options for her medical care at this time. Factors that could compromise this communication process include: (hospital delirium, uremia due to missing HD, other electrolyte imbalance).  When asked "Do you wish to continue dialysis?" She could not answer yes or no. She would begin a sentence stating, "When I started dialysis......" and then would stop speaking, even with repeated cueing.   Understanding: The patient was unable to recall information, link causal relationships, and process  general probabilities regarding life situations and medical treatment scenarios. She was unable to paraphrase her view of the current situation and her thoughts about it including future-oriented scenarios. The patient did present with impairments in memory, attention span, or intelligence. She would start a sentence and then drift off without completing the sentence.   Appreciation: The patient was unable to identify and describe their various illnesses and treatment options with potential outcomes. She was able to tell me she is in the hospital due to her legs hurting. She was unable to tell me what would happen if she stopped dialysis, even with education and request to return communication.   Rationalization: The patient was unable to weigh risks and benefits and come to a conclusion congruent with patient's perceived goals. She could not answer the question, "Why do you want to stop dialysis?" She did not respond to the question.   Conclusion: At this time, there is sufficient evidence to warrant removal of the patient's rights for medical decision-making regarding dialysis treatment. However, capacity can wax and wane during hospitalization and even throughout the course of the day. On my eval she did not have capacity  to make decision regarding continuing dialysis, however, per chart review she has expressed this to other hospital providers with rationalization, per documentation last night she verbalized, "I'm tired of living like this. I've had to go through so much this year."   Due to her not having capacity her surrogate decision maker would be her HCPOA- Tammylynn Shanahan who is her daughter.   Her significant other was at bedside and expressed that family would want all medical interventions that were offered to be continued. I discussed with him the need to continue to discuss medical interventions within the context of a patient's desired goals of care.    Vital Signs: BP 127/72   Pulse 84   Temp 98.2 F (36.8 C)   Resp 17   Ht 6' (1.829 m)   Wt 95.4 kg   SpO2 99%   BMI 28.52 kg/m  SpO2: SpO2: 99 % O2 Device: O2 Device: Room Air O2 Flow Rate: O2 Flow Rate (L/min): 3 L/min  Intake/output summary:  Intake/Output Summary (Last 24 hours) at 10/25/2022 1345 Last data filed at 10/25/2022 1021 Gross per 24 hour  Intake 270 ml  Output --  Net 270 ml   LBM: Last BM Date : 10/25/22 Baseline Weight: Weight: 113.4 kg Most recent weight: Weight: 95.4 kg       Palliative Assessment/Data: PPS: 30%      Patient Active Problem List   Diagnosis Date Noted   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/17/2022   Back pain 10/12/2022   Discitis of lumbar region 10/12/2022   Myositis 10/12/2022   Hemoglobin drop 08/28/2022   Chronic respiratory failure with hypoxia 08/26/2022   Acute on chronic blood loss anemia 08/26/2022   History of lower GI bleeding 08/26/2022   Insulin-requiring or dependent type II diabetes mellitus 08/26/2022   Chronic anticoagulation 08/26/2022   History of pulmonary embolism 03/20/2022 08/26/2022   Breast cancer, left S/P partial mastectomy,03/04/2022 08/26/2022   Generalized weakness    Unable to ambulate 05/09/2022   ESRD on dialysis    Bilateral leg pain    Depression    Pulmonary emboli     Obesity with body mass index (BMI) of 30.0 to 39.9    Anemia    Chronic diastolic CHF (congestive heart failure)    Seroma of breast    Bacteremia due to coagulase-negative  Staphylococcus    Left breast abscess    Urinary tract infection 03/22/2022   Anemia of chronic disease 03/22/2022   Acute pulmonary embolism without acute cor pulmonale 03/20/2022   Dyslipidemia 03/20/2022   Diabetic neuropathy 03/20/2022   Sepsis due to cellulitis 03/20/2022   Delay kidney tx func d/t fluid overload requiring acute dialysis 01/29/2020   Delay kidney tx func d/t ATN and fluid overload require acute dialysis 01/29/2020   Hypoglycemia 10/21/2019   Unresponsiveness 10/21/2019   GERD (gastroesophageal reflux disease) 10/21/2019   Hypothermia    Sepsis with encephalopathy without septic shock    Diarrhea    Acute metabolic encephalopathy    Acute on chronic diastolic CHF (congestive heart failure)    Acute on chronic respiratory failure with hypoxia 07/30/2019   Rectal bleed 02/13/2019   Acute respiratory failure with hypoxia 12/31/2018   History of CVA (cerebrovascular accident) 05/29/2018   Aphasia as late effect of cerebrovascular accident (CVA) 05/01/2018   Seizure disorder 03/14/2018   Stroke 03/07/2018   Slurred speech 11/14/2017   Type 2 diabetes mellitus with ESRD (end-stage renal disease) 11/12/2017   CAD (coronary artery disease) 11/12/2017   COPD (chronic obstructive pulmonary disease) 11/12/2017   ESRD on hemodialysis 11/12/2017   CVA (cerebral vascular accident) 04/14/2016   Essential hypertension 11/27/2015   Hyperlipidemia 11/27/2015   Prolonged Q-T interval on ECG [id number]   Aphasia complicating stroke [id number]   Cervical spine arthritis 07/27/2015   Diabetic retinopathy without macular edema associated with type 2 diabetes mellitus 07/27/2015   Osteoarthritis of spine with radiculopathy, lumbar region 07/27/2015   Obesity, Class III, BMI 40-49.9 (morbid obesity)  07/27/2015   Vitamin D deficiency Q000111Q   Diastolic heart failure Q000111Q    Palliative Care Assessment & Plan    Assessment/Recommendations/Plan  Patient did not have capacity on my evaluation today to make decision to continue vs stop dialysis. There are some concerns that she is not speaking her wishes due to the presence of her boyfriend at the bedside. I will visit her again tomorrow to re-eval and try to ascertain her wishes again.  Per discussion with team- family is not likely to set limits to patient care. If patient does not have capacity, and patient is unable to complete or tolerate dialysis- then medical team can set limits to interventions. Noted in discussion with nephrologist this morning patient stated she did wish to continue dialysis.    Code Status: Full code  Prognosis:  Unable to determine  Discharge Planning: To Be Determined  Care plan was discussed with patient, family, and care team.   Thank you for allowing the Palliative Medicine Team to assist in the care of this patient.  Total time:  75 minutes  Greater than 50%  of this time was spent counseling and coordinating care related to the above assessment and plan.  Mariana Kaufman, AGNP-C Palliative Medicine   Please contact Palliative Medicine Team phone at [phone] for questions and concerns.

## 2022-10-25 NOTE — Progress Notes (Signed)
Inquired as to how patient felt since previous admission. States "I feel much better now than I did then. But I'm ready for things to change." Discussed  plan of care & medications ordered. Patient states "I'm tired of living like this. I've had to go through so much this year."

## 2022-10-25 NOTE — Progress Notes (Signed)
Physical Therapy Treatment Patient Details Name: Cynthia Dean MRN: [id number] DOB: 1951/01/15 Today's Date: 10/25/2022   History of Present Illness Cynthia Dean is a 72 y.o. 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 She presents to the ED with worsening lower extremity pain.    PT Comments    Pt received sitting upright in bed. Significant other at bedside. Pt is agreeable to PT/OT co-treat to maximize mobility efforts due to known, poor pain tolerance with mobility during this admission. Significant other very supportive throughout session. Pt is quick to become anxious and emotionally labile with mobility attempts. Ultimately reliant on maxA+2 fr sitting EOB with frequent education and encouragement to pt. Pt requires frequent minA at torso to maintain upright. After sitting ~ 2 minutes pt does become less fearful/anxious and able to maintain static sitting at supervision level with anterior truncal lean with BUE support on LE's. Pt resistive to ADL participation with OT in general but is able to sit at EOB ~10 minutes with frequent bouts of encouragement and TC's to prevent pt returning to supine. Pt resistive to further mobility despite attempting STS efforts relying on maxA+2 to return to supine. Pt tolerating RLE therex well as listed below. Pt has more activation in RLE than previous documented, anticipate cognition limiting pt's command following abilities but remains pretty weak with quad activation (2/5 MMT). Pt with all needs in reach in care of spouse at bedside. Continue to heavily recommend PT f/u recs as pt remains below her baseline level of mobility.    Recommendations for follow up therapy are one component of a multi-disciplinary discharge planning process, led by the attending physician.  Recommendations may be updated based on patient  status, additional functional criteria and insurance authorization.  Follow Up Recommendations  Can patient physically be transported by private vehicle: No    Assistance Recommended at Discharge Intermittent Supervision/Assistance  Patient can return home with the following Two people to help with walking and/or transfers;A lot of help with bathing/dressing/bathroom;Assistance with cooking/housework;Direct supervision/assist for medications management;Direct supervision/assist for financial management;Assist for transportation;Help with stairs or ramp for entrance   Equipment Recommendations  None recommended by PT    Recommendations for Other Services       Precautions / Restrictions Precautions Precautions: Fall Restrictions Weight Bearing Restrictions: No     Mobility  Bed Mobility Overal bed mobility: Needs Assistance Bed Mobility: Supine to Sit, Sit to Supine     Supine to sit: Max assist, +2 for physical assistance, HOB elevated Sit to supine: Mod assist, +2 for physical assistance     Patient Response: Flat affect, Anxious  Transfers                   General transfer comment: Pt resistant to standing    Ambulation/Gait                   Stairs             Wheelchair Mobility    Modified Rankin (Stroke Patients Only)       Balance Overall balance assessment: Needs assistance Sitting-balance support: Feet supported, Single extremity supported Sitting balance-Leahy Scale: Fair Sitting balance - Comments: improved ability to maintain static sitting. Generally torso anterior to pelvis but maintains at supervision.  Cognition Arousal/Alertness: Awake/alert Behavior During Therapy: Anxious, Flat affect                                            Exercises General Exercises - Lower Extremity Ankle Circles/Pumps: AROM, 10 reps, Both, Supine Short Arc Quad: AROM,  Strengthening, Left, 10 reps, AAROM, Right, Supine Heel Slides: AROM, AAROM, Both, 10 reps, Supine, Strengthening Hip ABduction/ADduction: AAROM, Strengthening, Right, 10 reps, Supine Hip Flexion/Marching: AAROM, Strengthening, Right, 10 reps, Supine Other Exercises Other Exercises: manually resisted RLE hip/knee extension x10    General Comments        Pertinent Vitals/Pain Pain Assessment Pain Assessment: Faces Faces Pain Scale: Hurts little more Pain Location: RLE Pain Descriptors / Indicators: Grimacing, Guarding, Crying Pain Intervention(s): Limited activity within patient's tolerance, Monitored during session, Repositioned, Premedicated before session    Home Living                          Prior Function            PT Goals (current goals can now be found in the care plan section) Acute Rehab PT Goals PT Goal Formulation: Patient unable to participate in goal setting    Frequency    Min 2X/week      PT Plan Current plan remains appropriate    Co-evaluation PT/OT/SLP Co-Evaluation/Treatment: Yes Reason for Co-Treatment: Complexity of the patient's impairments (multi-system involvement);For patient/therapist safety PT goals addressed during session: Mobility/safety with mobility;Strengthening/ROM OT goals addressed during session: ADL's and self-care;Strengthening/ROM      AM-PAC PT "6 Clicks" Mobility   Outcome Measure  Help needed turning from your back to your side while in a flat bed without using bedrails?: A Lot Help needed moving from lying on your back to sitting on the side of a flat bed without using bedrails?: A Lot Help needed moving to and from a bed to a chair (including a wheelchair)?: A Lot Help needed standing up from a chair using your arms (e.g., wheelchair or bedside chair)?: Total Help needed to walk in hospital room?: Total Help needed climbing 3-5 steps with a railing? : Total 6 Click Score: 9    End of Session  Equipment Utilized During Treatment: Gait belt Activity Tolerance: Patient limited by pain;Other (comment) (pt very anxious/nervous and emotionally labile)   Nurse Communication: Mobility status PT Visit Diagnosis: Unsteadiness on feet (R26.81);Other abnormalities of gait and mobility (R26.89);Muscle weakness (generalized) (M62.81);Difficulty in walking, not elsewhere classified (R26.2)     Time: [id number] PT Time Calculation (min) (ACUTE ONLY): 28 min  Charges:  $Therapeutic Exercise: 8-22 mins           Salem Caster. Fairly IV, PT, DPT Physical Therapist- [facility]  10/25/2022, 10:33 AM

## 2022-10-25 NOTE — Plan of Care (Signed)
  Problem: Education: Goal: Ability to describe self-care measures that may prevent or decrease complications (Diabetes Survival Skills Education) will improve Outcome: Progressing   Problem: Education: Goal: Knowledge of General Education information will improve Description: Including pain rating scale, medication(s)/side effects and non-pharmacologic comfort measures Outcome: Progressing   

## 2022-10-25 NOTE — Progress Notes (Signed)
Occupational Therapy Treatment Patient Details Name: Cynthia Dean MRN: [id number] DOB: 07/12/1951 Today's Date: 10/25/2022   History of present illness Cynthia Dean is a 72 y.o. 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 She presents to the ED with worsening lower extremity pain.   OT comments  Patient agreeable to OT/PT co-treatment to maximize safety and participation. Pt received in bed with significant other present. Pt alert and cooperative at beginning of session, however, quickly became emotionally labile with therapists' attempts to encourage EOB mobility. Pt's activity tolerance limited by pain although she was pre-medicated prior to session. She ultimately required Max A +2 to come to EOB. Relied on max encouragement from SO to maintain sitting EOB for ~10 min. Pt slightly resistant to engage in seated grooming tasks. She required Mod A +2 to return to supine and was left with PT present in room. D/C recommendation remains appropriate. OT will continue to follow acutely.    Recommendations for follow up therapy are one component of a multi-disciplinary discharge planning process, led by the attending physician.  Recommendations may be updated based on patient status, additional functional criteria and insurance authorization.    Assistance Recommended at Discharge Frequent or constant Supervision/Assistance  Patient can return home with the following  Two people to help with walking and/or transfers;Help with stairs or ramp for entrance;A lot of help with bathing/dressing/bathroom;Assistance with cooking/housework;Assist for transportation;Direct supervision/assist for financial management;Direct supervision/assist for medications management   Equipment Recommendations  None recommended by OT    Recommendations for Other Services       Precautions / Restrictions Precautions Precautions: Fall Restrictions Weight Bearing Restrictions: No       Mobility Bed Mobility Overal bed mobility: Needs Assistance Bed Mobility: Supine to Sit, Sit to Supine     Supine to sit: Max assist, +2 for physical assistance, HOB elevated Sit to supine: Mod assist, +2 for physical assistance        Transfers         General transfer comment: Pt resistant to standing 2/2 RLE pain     Balance Overall balance assessment: Needs assistance Sitting-balance support: Feet supported, Single extremity supported Sitting balance-Leahy Scale: Fair Sitting balance - Comments: Min guard at times for safety but overall close supervision           ADL either performed or assessed with clinical judgement   ADL Overall ADL's : Needs assistance/impaired     Grooming: Sitting;Wash/dry face;Brushing hair Grooming Details (indicate cue type and reason): pt completed face washing with VC and set up A, Max A for combing hair             Lower Body Dressing: Maximal assistance;Bed level Lower Body Dressing Details (indicate cue type and reason): socks                    Extremity/Trunk Assessment Upper Extremity Assessment Upper Extremity Assessment: Generalized weakness   Lower Extremity Assessment Lower Extremity Assessment: Generalized weakness        Vision Patient Visual Report: No change from baseline     Perception     Praxis      Cognition Arousal/Alertness: Awake/alert Behavior During Therapy: Anxious, Flat affect           General Comments: Pt emotionally labile this date and required Max encouragement from SO to participate. Overall anxious with mobility due to RLE pain.  Exercises      Shoulder Instructions       General Comments      Pertinent Vitals/ Pain       Pain Assessment Pain Assessment: Faces Faces Pain Scale: Hurts little more Pain Location: RLE Pain Descriptors /  Indicators: Grimacing, Guarding, Crying Pain Intervention(s): Limited activity within patient's tolerance, Monitored during session, Premedicated before session, Repositioned  Home Living            Prior Functioning/Environment              Frequency  Min 1X/week        Progress Toward Goals  OT Goals(current goals can now be found in the care plan section)  Progress towards OT goals: OT to reassess next treatment  Acute Rehab OT Goals Patient Stated Goal: reduce pain OT Goal Formulation: Patient unable to participate in goal setting Time For Goal Achievement: 10/31/22 Potential to Achieve Goals: [place] Discharge plan remains appropriate;Frequency remains appropriate    Co-evaluation    PT/OT/SLP Co-Evaluation/Treatment: Yes Reason for Co-Treatment: Complexity of the patient's impairments (multi-system involvement);For patient/therapist safety PT goals addressed during session: Mobility/safety with mobility;Strengthening/ROM OT goals addressed during session: ADL's and self-care;Strengthening/ROM      AM-PAC OT "6 Clicks" Daily Activity     Outcome Measure   Help from another person eating meals?: None Help from another person taking care of personal grooming?: A Lot Help from another person toileting, which includes using toliet, bedpan, or urinal?: A Lot Help from another person bathing (including washing, rinsing, drying)?: A Lot Help from another person to put on and taking off regular upper body clothing?: A Little Help from another person to put on and taking off regular lower body clothing?: A Lot 6 Click Score: 15    End of Session    OT Visit Diagnosis: Other abnormalities of gait and mobility (R26.89);Muscle weakness (generalized) (M62.81)   Activity Tolerance Patient limited by pain;Other (comment) (very anxious/emotionally labile with activity)   Patient Left in bed;with call bell/phone within reach;with bed alarm set;Other (comment);with  family/visitor present (left supine in bed with PT present)   Nurse Communication Mobility status        Time: [id number] OT Time Calculation (min): 19 min  Charges: OT General Charges $OT Visit: 1 Visit OT Treatments $Self Care/Home Management : 8-22 mins  [facility] MS, OTR/L ascom [phone]  10/25/22, 2:31 PM

## 2022-10-25 NOTE — Progress Notes (Signed)
[facility]  ROUNDING NOTE   Subjective:   Cynthia Dean is a 72 y.o. 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 She presents to the ED with worsening lower extremity pain. Patient will be admitted for Back pain [M54.9] Bilateral leg pain [M79.604, M79.605]  Patient is known to our practice and receives outpatient dialysis treatments at [facility] on a MWF schedule, supervised by Dr Candiss Norse.   Patient seen resting in bed Alert and oriented to self and place Boyfriend at bedside, states patient was confused yesterday and requested dialysis today.  Patient states she would like to continue dialysis.  Note placed by night shift stating patient said she was tired of living like this.   Objective:  Vital signs in last 24 hours:  Temp:  [98.2 F (36.8 C)-98.6 F (37 C)] 98.2 F (36.8 C) (04/02 0733) Pulse Rate:  [84-98] 84 (04/02 0733) Resp:  [17-20] 17 (04/02 0733) BP: (119-134)/(58-73) 127/72 (04/02 0733) SpO2:  [90 %-100 %] 99 % (04/02 0733)  Weight change:  Filed Weights   10/20/22 1413 10/21/22 0820 10/21/22 1223  Weight: 93.6 kg 94.2 kg 95.4 kg    Intake/Output: I/O last 3 completed shifts: In: 200 [P.O.:100; IV Piggyback:100] Out: -    Intake/Output this shift:  Total I/O In: 120 [P.O.:120] Out: -   Physical Exam: General: NAD, laying in bed  Head: Normocephalic, atraumatic.  Moist oral mucosal membranes  Eyes: Anicteric  Lungs:  Clear to auscultation, normal effort  Heart: Regular rate and rhythm  Abdomen:  Soft, nontender  Extremities: no peripheral edema.  Neurologic: Alert and oriented. Slow to respond  Skin: No lesions  Access: Left AVG    Basic Metabolic Panel: Recent Labs  Lab 10/19/22 0600 10/20/22 0637 10/21/22 0358 10/22/22 0530 10/24/22 0445  NA 142 142 137 137 137  K 3.6 4.8 2.8*  3.4* 3.1*  CL 104 103 99 100 100  CO2 24 23 28 27 25   GLUCOSE 128* 104* 106* 123* 172*  BUN 47* 53* 28* 19 36*  CREATININE 10.85* 12.21* 7.05* 5.14* 8.27*  CALCIUM 10.2 9.9 9.2 9.9 10.1  MG  --  2.2 1.9 1.9 2.0  PHOS  --   --   --   --  4.2     Liver Function Tests: Recent Labs  Lab 10/24/22 0445  ALBUMIN 2.4*    No results for input(s): "LIPASE", "AMYLASE" in the last 168 hours. No results for input(s): "AMMONIA" in the last 168 hours.  CBC: Recent Labs  Lab 10/19/22 0600 10/20/22 0637 10/21/22 0358 10/22/22 0530 10/24/22 0445  WBC 7.5 6.9 7.3 6.7 8.3  HGB 9.6* 9.5* 9.4* 9.6* 9.6*  HCT 33.4* 33.1* 32.2* 32.5* 32.4*  MCV 82.7 83.0 81.7 80.4 81.0  PLT 186 177 154 142* 192     Cardiac Enzymes: No results for input(s): "CKTOTAL", "CKMB", "CKMBINDEX", "TROPONINI" in the last 168 hours.  BNP: Invalid input(s): "POCBNP"  CBG: Recent Labs  Lab 10/23/22 0831 10/23/22 1156 10/23/22 1740 10/24/22 1651 10/24/22 1947  GLUCAP 118* 114* 161* 148* 130*     Microbiology: Results for orders placed or performed during the hospital encounter of 10/12/22  Blood culture (routine x 2)     Status: None   Collection Time: 10/12/22  5:40 PM   Specimen: BLOOD  Result Value Ref Range Status   Specimen Description BLOOD BLOOD RIGHT HAND [facility]  Final   Special Requests   Final    BOTTLES DRAWN AEROBIC AND ANAEROBIC Blood Culture results may not be optimal due to an inadequate volume of blood received in culture bottles   Culture   Final    NO GROWTH 5 DAYS Performed at [facility], [address] [place], [place] [address]    Report Status 10/17/2022 FINAL  Final  Blood culture (routine x 2)     Status: None   Collection Time: 10/12/22  6:56 PM   Specimen: BLOOD  Result Value Ref Range Status   Specimen Description BLOOD BLOOD RIGHT FOREARM  Final   Special Requests   Final    BOTTLES DRAWN AEROBIC ONLY Blood Culture results may not be optimal due to an  inadequate volume of blood received in culture bottles   Culture   Final    NO GROWTH 5 DAYS Performed at [facility], [address] [place], [place] [address]    Report Status 10/17/2022 FINAL  Final  Aerobic/Anaerobic Culture w Gram Stain (surgical/deep wound)     Status: None   Collection Time: 10/13/22 11:44 AM   Specimen: Abscess  Result Value Ref Range Status   Specimen Description   Final    ABSCESS Performed at [facility], [address] [place], [place] [address]    Special Requests   Final    MUSCLE ABSCESS Performed at [facility], [address] [place], [place] [address]    Gram Stain   Final    ABUNDANT WBC PRESENT, PREDOMINANTLY PMN NO ORGANISMS SEEN    Culture   Final    FEW STAPHYLOCOCCUS CAPITIS NO ANAEROBES ISOLATED Performed at [facility], [place] [address], [place], [place] [address]    Report Status 10/18/2022 FINAL  Final   Organism ID, Bacteria STAPHYLOCOCCUS CAPITIS  Final      Susceptibility   Staphylococcus capitis - MIC*    CIPROFLOXACIN <=0.5 SENSITIVE Sensitive     ERYTHROMYCIN >=8 RESISTANT Resistant     GENTAMICIN <=0.5 SENSITIVE Sensitive     OXACILLIN <=0.25 SENSITIVE Sensitive     TETRACYCLINE <=1 SENSITIVE Sensitive     VANCOMYCIN 1 SENSITIVE Sensitive     TRIMETH/SULFA <=10 SENSITIVE Sensitive     CLINDAMYCIN INTERMEDIATE Intermediate     RIFAMPIN <=0.5 SENSITIVE Sensitive     Inducible Clindamycin NEGATIVE Sensitive     * FEW STAPHYLOCOCCUS CAPITIS    Coagulation Studies: No results for input(s): "LABPROT", "INR" in the last 72 hours.  Urinalysis: No results for input(s): "COLORURINE", "LABSPEC", "PHURINE", "GLUCOSEU", "HGBUR", "BILIRUBINUR", "KETONESUR", "PROTEINUR", "UROBILINOGEN", "NITRITE", "LEUKOCYTESUR" in the last 72 hours.  Invalid input(s): "APPERANCEUR"    Imaging: No results found.   Medications:    anticoagulant sodium citrate      ceFAZolin (ANCEF) IV 1 g (10/24/22  2317)   methocarbamol (ROBAXIN) IV Stopped (10/22/22 2055)    (feeding supplement) PROSource Plus  30 mL Oral BID BM   acetaminophen  1,000 mg Oral Q8H   atorvastatin  80 mg Oral QHS   Chlorhexidine Gluconate Cloth  6 each Topical Q0600   epoetin (EPOGEN/PROCRIT) injection  10,000 Units Intravenous Q M,W,F-HD   feeding supplement (NEPRO CARB STEADY)  237 mL Oral TID BM   heparin injection (subcutaneous)  5,000 Units Subcutaneous Q8H   levETIRAcetam  1,000 mg Oral Daily   levETIRAcetam  250 mg Oral Q M,W,F   LORazepam  1 mg Oral Once per day on Mon Wed  Fri   multivitamin  1 tablet Oral QHS   nystatin  5 mL Oral QID   pantoprazole  20 mg Oral BID   QUEtiapine  50 mg Oral QHS   sertraline  25 mg Oral Daily   sodium chloride flush  10-40 mL Intracatheter Q12H   umeclidinium-vilanterol  1 puff Inhalation Daily   alteplase, anticoagulant sodium citrate, heparin, hydrALAZINE, HYDROmorphone, lidocaine (PF), lidocaine-prilocaine, LORazepam, methocarbamol (ROBAXIN) IV, pentafluoroprop-tetrafluoroeth, senna-docusate, sodium chloride flush  Assessment/ Plan:  Ms. Coralei Tyrell is a 72 y.o.  female with end stage renal disease on hemodialysis, coronary artery disease, COPD with intermittent home O2, hypertension, hyperlipidemia, stroke, diabetes mellitus type II, and breast cancer who was admitted to [facility] on 10/12/2022 for Back pain [M54.9] Bilateral leg pain [M79.604, M79.605]  Patient was found to have vertebral osteomyelitis and psoas abscess. Cultures positive for staph capitis  [facility] MWF Left AVG 102.5kg  End-stage renal disease on hemodialysis. Resumed on MWF schedule. Next treatment for Monday -Patient refused dialysis yesterday. Stated she did not want to continue dialysis, that she was tired of doing this.  -Agreeable to proceed with dialysis at this time.  -Labs are stable, next treatment scheduled for Wednesday.  -Would encourage palliative care to follow  and offer patient and family support.   2. Anemia of chronic kidney disease Normocytic Lab Results  Component Value Date   HGB 9.6 (L) 10/24/2022  Patient receives Mircera at outpatient clinic.  EPO with dialysis treatments.   3. Secondary Hyperparathyroidism: with outpatient labs: PTH 180 (3/11), phosphorus 5.4, calcium 9.3   Lab Results  Component Value Date   PTH 131 (H) 03/14/2018   CALCIUM 10.1 10/24/2022   CAION 1.09 (L) 08/28/2022   PHOS 4.2 10/24/2022  Calcium and phosphorus within desired range. Not currently on binders or vitamin D agent.   4.  Hypotension on hemodialysis treatment. Holding home regimen of losartan, metoprolol and torsemide. Blood pressure acceptable for this patient.   5. Diabetes mellitus type II with chronic kidney disease/renal manifestations: insulin dependent. Home regimen includes Novolin 70/30. Most recent hemoglobin A1c is 5.2% on admission.   SSI managed by primary team  6. Vertebral osteomyelitis, seen on CT lumber spine. Neurosurgery feels patient is not a surgical candidate CT guided aspiration of psoas abscess on 10/13/22. Cultures positive for Staph Capitis.   - Will continue Cefazolin with hemodialysis for 8 weeks. Appreciate Infectious Disease input.    LOS: [place] 4/2/202411:58 AM

## 2022-10-25 NOTE — Progress Notes (Signed)
PROGRESS NOTE    Cynthia Dean  [id number] DOB: 11/20/1950 DOA: 10/12/2022 PCP: Lowella Bandy, MD  [id number]  LOS: 13 days   Brief hospital course:   Assessment & Plan: Cynthia Dean is a 72 y/o F w/ PMH of ESRD on HD, COPD, DM2, HTN, depression, CVA who presented w/ back pain x 3 weeks.   Pt presented w/ back pain and was found to intradiscal abscess/lumbar discitis/b/l psoas abscess on MRI.   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  Wound cx growing staph capitis.  ID consulted.  Abx de-escalated to cefazolin.  Not a good surgical candidate as per neuro surg.   --cont cefazolin, for 6 weeks, End Date: 12/02/22   Confusion and agitation Hospital delirium --Pt was agitated, and also not responding to questions or commands during early hospitalization.  Mental status did start to improve, and now pt is calm and interactive, however, still seems confused and not totally coherent.  Pt reportedly was confused at baseline in recent months, and daughter was her Media planner.  Pt did not appear to have decision-making capacity. --cont seroquel 50 mg nightly (new) to help with delirium and sleep.  ESRD: on HD MWF.  Pt refused HD on 10/17/22, 10/18/22 and 3/27, 4/1. --palliative consulted, currently family requests full scope of treatment.   --pt accepted dialysis twice when pre-medicated with ativan, but pt will not accept dialysis when she is alert and awake. --Pt does not seem to have decision-making capacity, however, she does clearly refuse dialysis and we can't force her to accept dialysis.  Family however is firm in wanting dialysis. --consult ethic committee today.   Generalized weakness:  PT recs SNF    ACD:  likely secondary ESRD. H&H are stable    Hypokalemia:  --monitor and replete PRN with oral   Hx of seizures:   continue on home dose of keppra   HLD:  continue on statin    Hx of COPD:  w/o exacerbation.  Continue on bronchodilators    DM2: well controlled HbA1c 5.2.  --d/c'ed BG checks    HTN:  --hold losartan and Toprol 2/2 hypotension   GERD:  continue on PPI    Depression: severity unknown.  Continue on home dose of sertraline     Overweight: BMI 28.2.  Would benefit from weight loss   Right foot pain --pt reported severity of pain not matching her exam. --oral dilaudid 1 mg q6h PRN ordered by palliative care provider   DVT prophylaxis: Heparin SQ Code Status: Full code  Family Communication: boyfriend updated at bedside today Level of care: Telemetry Medical Dispo:   The patient is from: home Anticipated d/c is to: SNF rehab Anticipated d/c date is: undetermined, since pt is refusing dialysis currently   Subjective and Interval History:  Did not complain much about right foot pain today.   Objective: Vitals:   10/25/22 0418 10/25/22 0733 10/25/22 1628 10/25/22 2124  BP: 129/68 127/72 (!) 151/82 127/62  Pulse: 84 84 89 90  Resp: 20 17 16 16   Temp: 98.6 F (37 C) 98.2 F (36.8 C)  97.8 F (36.6 C)  TempSrc:    Oral  SpO2: 100% 99% 94% 97%  Weight:      Height:        Intake/Output Summary (Last 24 hours) at 10/25/2022 2146 Last data filed at 10/25/2022 [phone]  Gross per 24 hour  Intake 270 ml  Output --  Net 270 ml   Filed Weights   10/20/22 1413 10/21/22 0820 10/21/22 1223  Weight: 93.6 kg 94.2 kg 95.4 kg    Examination:   Constitutional: NAD, alert, oriented to self HEENT: conjunctivae and lids normal, EOMI CV: No cyanosis.   RESP: normal respiratory effort, on RA SKIN: warm, dry Neuro: II - XII grossly intact.     Data Reviewed: I have personally reviewed labs and imaging studies  Time spent: 35 minutes  Enzo Bi, MD [facility] If 7PM-7AM, please contact night-coverage 10/25/2022, 9:46 PM

## 2022-10-26 DIAGNOSIS — M4646 Discitis, unspecified, lumbar region: Secondary | ICD-10-CM | POA: Diagnosis not present

## 2022-10-26 DIAGNOSIS — D638 Anemia in other chronic diseases classified elsewhere: Secondary | ICD-10-CM | POA: Diagnosis not present

## 2022-10-26 DIAGNOSIS — Z7189 Other specified counseling: Secondary | ICD-10-CM | POA: Diagnosis not present

## 2022-10-26 DIAGNOSIS — K6812 Psoas muscle abscess: Secondary | ICD-10-CM | POA: Diagnosis not present

## 2022-10-26 DIAGNOSIS — N186 End stage renal disease: Secondary | ICD-10-CM | POA: Diagnosis not present

## 2022-10-26 LAB — CBC
HCT: 31.5 % — ABNORMAL LOW (ref 36.0–46.0)
Hemoglobin: 9.3 g/dL — ABNORMAL LOW (ref 12.0–15.0)
MCH: 24 pg — ABNORMAL LOW (ref 26.0–34.0)
MCHC: 29.5 g/dL — ABNORMAL LOW (ref 30.0–36.0)
MCV: 81.4 fL (ref 80.0–100.0)
Platelets: 178 10*3/uL (ref 150–400)
RBC: 3.87 MIL/uL (ref 3.87–5.11)
RDW: 21.1 % — ABNORMAL HIGH (ref 11.5–15.5)
WBC: 7.9 10*3/uL (ref 4.0–10.5)
nRBC: 0 % (ref 0.0–0.2)

## 2022-10-26 LAB — BASIC METABOLIC PANEL
Anion gap: 12 (ref 5–15)
BUN: 52 mg/dL — ABNORMAL HIGH (ref 8–23)
CO2: 23 mmol/L (ref 22–32)
Calcium: 10.3 mg/dL (ref 8.9–10.3)
Chloride: 104 mmol/L (ref 98–111)
Creatinine, Ser: 10.68 mg/dL — ABNORMAL HIGH (ref 0.44–1.00)
GFR, Estimated: 4 mL/min — ABNORMAL LOW (ref >60)
Glucose, Bld: 156 mg/dL — ABNORMAL HIGH (ref 70–99)
Potassium: 3.6 mmol/L (ref 3.5–5.1)
Sodium: 139 mmol/L (ref 135–145)

## 2022-10-26 LAB — MAGNESIUM: Magnesium: 2.2 mg/dL (ref 1.7–2.4)

## 2022-10-26 LAB — GLUCOSE, CAPILLARY: Glucose-Capillary: 132 mg/dL — ABNORMAL HIGH (ref 70–99)

## 2022-10-26 MED ORDER — EPOETIN ALFA 10000 UNIT/ML IJ SOLN
INTRAMUSCULAR | Status: AC
  Filled 2022-10-26: qty 1

## 2022-10-26 NOTE — Progress Notes (Addendum)
Hemodialysis note  Received patient in bed to unit. Alert and oriented to self  Informed consent signed and in chart.  Treatment initiated: 1202 Treatment completed: 1620  Patient tolerated well. Transported back to room, alert without acute distress.  Report given to patient's RN.   Access used: LUA AVG Access issues: none  Total UF removed: 1.5L Medication(s) given:  Epogen Weight: 93.4 kg  [place] Constable RN [facility]

## 2022-10-26 NOTE — Progress Notes (Signed)
Patient transferred to dialysis via bed in stable condition.

## 2022-10-26 NOTE — Progress Notes (Signed)
Spoke with dialysis unit. Patient scheduled for second shift (around 1300).

## 2022-10-26 NOTE — Progress Notes (Signed)
Patient received from HD in stable condition via bed.

## 2022-10-26 NOTE — Progress Notes (Signed)
Nutrition Follow-up  DOCUMENTATION CODES:   Not applicable  INTERVENTION:   -Continue liberalized diet of regular -Continue renal MVI daily -Continue 30 ml Prosource Plus BID, each supplement provides 100 kcals and 15 grams protein -Continue Nepro Shake po TID, each supplement provides 425 kcal and 19 grams protein   NUTRITION DIAGNOSIS:   Increased nutrient needs related to chronic illness (ESRD on HD) as evidenced by estimated needs.  Ongoing  GOAL:   Patient will meet greater than or equal to 90% of their needs  Progressing   MONITOR:   PO intake, Supplement acceptance  REASON FOR ASSESSMENT:   Malnutrition Screening Tool    ASSESSMENT:   Pt with PMH of ESRD on HD, HLD, COPD, DM2, HTN, depression, CVA who presents with back pain x 3 weeks PTA.  3/21- s/p CT aspiration of rt psoas muscle (2 ml purulent material removed)   Reviewed I/O's: +120 ml x 24 hours and -1.4 L since admission  Spoke with pt at bedside. Pt is more awake and alert in comparison to previous interactions with this RD. Pt smiling and states she is feeling much better today. Pt reports she is trying to eat and is eating snacks brought in by pt boyfriend. Pt also drank some Nepro, which she said she likes and drinks. Noted bottle opened with a few sips- pt consumed a few more sips for this RD during visit.   Pt reports "I want to get better. I want to do whatever I need to do to feel better". RD reviewed nutrition care plan with pt and discussed how this will meet pt goals. Pt has HD scheduled later today; pt states she is looking forward to going, as this will make her better. Pt very appreciative of visit ("you are very nice and I appreciate you taking care of me").   Palliative care following for goals of care. Ethics consult has also been placed for decision making capacity.   Wt has been stable over the past week.   Medications reviewed and include keppra.   Labs reviewed: CBGS: 130-148  (inpatient orders for glycemic control are none).    Diet Order:   Diet Order             Diet regular Room service appropriate? Yes; Fluid consistency: Thin  Diet effective now                   EDUCATION NEEDS:   No education needs have been identified at this time  Skin:  Skin Assessment: Skin Integrity Issues: Skin Integrity Issues:: Other (Comment) Other: IAD to lt buttocks  Last BM:  10/26/22 (type 6)  Height:   Ht Readings from Last 1 Encounters:  10/12/22 6' (1.829 m)    Weight:   Wt Readings from Last 1 Encounters:  10/26/22 94.6 kg    Ideal Body Weight:  72.7 kg  BMI:  Body mass index is 28.29 kg/m.  Estimated Nutritional Needs:   Kcal:  2150-2350  Protein:  105-120 grams  Fluid:  1000 ml + UOP    Loistine Chance, RD, LDN, [place] Registered Dietitian II Certified Diabetes Care and Education Specialist Please refer to [facility] for RD and/or RD on-call/weekend/after hours pager

## 2022-10-26 NOTE — Progress Notes (Addendum)
Daily Progress Note   Patient Name: Cynthia Dean       Date: 10/26/2022 DOB: 09-Mar-1951  Age: 72 y.o. MRN#: [id number] Attending Physician: Ezekiel Slocumb, DO Primary Care Physician: Lowella Bandy, MD Admit Date: 10/12/2022  Reason for Consultation/Follow-up: Establishing goals of care  Patient Profile/HPI:   72 y.o. female with history of ESRD on HD admitted on 3/20/2024with c/o back pain. Workup reveals lumbar discitis L4-L5. She has been unable to walk for approximately 3 weeks. Being treated with antibiotics. Palliative medicine consulted for [place].   Subjective:  Chart reviewed including labs, progress notes, imaging from this and previous encounters.  Initial Palliative consult completed on 10/19/22 by Wadie Lessen, NP.  In the interim Ms. Fejes has expressed her desire to multiple providers to stop dialysis.  There is some question of her capacity as well.  There is also conflict expressed that patient desires to stop dialysis, but family directs her to continue.   I saw patient to establish capacity and further discuss goals of care if she is able.   Yesterday 4/2 patient did not have capacity for decision making. I am seeing her on 4/3 to re-evaluate and have further discussion.   Met with patient- no family at bedside.    The main determinant of capacity is cognition. Capacity describes a person's ability to make a decision.  Medical capacity refers to the ability to utilize information regarding a  proposed treatment option to make a choice that is congruent with one's own values and preferences.    Capacity can wax and wane from one moment to another and can vary based on the decision being made.   Mental Capacity Assessment: I have evaluated the following  areas to assess the Brittoni Aland Difranco's mental capacity regarding medical decision-making ability which pertains to competency to accept or refuse medical treatment.   The specific treatment or service in question is: dialysis treatment    Communication: The patient was able to clearly state preferred treatment options for her medical care at this time. Factors that could compromise this communication process include: (hospital delirium, uremia due to missing HD, other electrolyte imbalance).  When asked "Do you wish to continue dialysis?" She stated "yes, I want to continue dialysis"   Understanding: The patient was able to recall information, link  causal relationships, and process general probabilities regarding life situations and medical treatment scenarios. She was able to paraphrase her view of the current situation and her thoughts about it including future-oriented scenarios. The patient did present with impairments in memory, attention span, or intelligence. She has slow speech, but is oriented and answered questions completely and competently. When discussing dialysis and that she had previously said did not want to continue dialysis, she shared that she had considered stopping dialysis due to the great amount of pain she was in. However, now that her pain is better controlled, and after having talked with her boyfriend and daughter about it, she isn't ready to die, and she does want to continue to have dialysis.   Appreciation: The patient was able to identify and describe their various illnesses and treatment options with potential outcomes. She was able to tell me she is in the hospital due to her legs hurting. She was able to tell me that she wants to continue dialysis, that the alternative is comfort and dying, and she is not yet ready for end of life.   Rationalization: The patient was able to weigh risks and benefits and come to a conclusion congruent with patient's perceived goals.  She was able to tell me that she wishes to continue dialysis so that she can continue to spend time with her daughter and her boyfriend.   Conclusion: At this time, there is not sufficient evidence to warrant removal of the patient's rights for medical decision-making regarding dialysis treatment. However, capacity can wax and wane during hospitalization and even throughout the course of the day. On my eval she did have capacity to make decision regarding continuing dialysis.   Magalie shared that having her pain decreased has made a big difference in her feelings about continuing dialysis. She agrees to go to dialysis this afternoon.   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/2022   Back pain 10/12/2022   Discitis of lumbar region 10/12/2022   Myositis 10/12/2022   Hemoglobin drop 08/28/2022   Chronic respiratory failure with hypoxia 08/26/2022   Acute on chronic blood loss anemia 08/26/2022   History of lower GI bleeding 08/26/2022   Insulin-requiring or dependent type II diabetes mellitus 08/26/2022   Chronic anticoagulation 08/26/2022   History of pulmonary embolism 03/20/2022 08/26/2022   Breast cancer, left S/P partial mastectomy,03/04/2022 08/26/2022   Generalized weakness    Unable to ambulate 05/09/2022   ESRD on dialysis    Bilateral leg pain    Depression    Pulmonary emboli    Obesity with  body mass index (BMI) of 30.0 to 39.9    Anemia    Chronic diastolic CHF (congestive heart failure)    Seroma  of breast    Bacteremia due to coagulase-negative Staphylococcus    Left breast abscess    Urinary tract infection 03/22/2022   Anemia of chronic disease 03/22/2022   Acute pulmonary embolism without acute cor pulmonale 03/20/2022   Dyslipidemia 03/20/2022   Diabetic neuropathy 03/20/2022   Sepsis due to cellulitis 03/20/2022   Delay kidney tx func d/t fluid overload requiring acute dialysis 01/29/2020   Delay kidney tx func d/t ATN and fluid overload require acute dialysis 01/29/2020   Hypoglycemia 10/21/2019   Unresponsiveness 10/21/2019   GERD (gastroesophageal reflux disease) 10/21/2019   Hypothermia    Sepsis with encephalopathy without septic shock    Diarrhea    Acute metabolic encephalopathy    Acute on chronic diastolic CHF (congestive heart failure)    Acute on chronic respiratory failure with hypoxia 07/30/2019   Rectal bleed 02/13/2019   Acute respiratory failure with hypoxia 12/31/2018   History of CVA (cerebrovascular accident) 05/29/2018   Aphasia as late effect of cerebrovascular accident (CVA) 05/01/2018   Seizure disorder 03/14/2018   Stroke 03/07/2018   Slurred speech 11/14/2017   Type 2 diabetes mellitus with ESRD (end-stage renal disease) 11/12/2017   CAD (coronary artery disease) 11/12/2017   COPD (chronic obstructive pulmonary disease) 11/12/2017   ESRD on hemodialysis 11/12/2017   CVA (cerebral vascular accident) 04/14/2016   Essential hypertension 11/27/2015   Hyperlipidemia 11/27/2015   Prolonged Q-T interval on ECG [id number]   Aphasia complicating stroke [id number]   Cervical spine arthritis 07/27/2015   Diabetic retinopathy without macular edema associated with type 2 diabetes mellitus 07/27/2015   Osteoarthritis of spine with radiculopathy, lumbar region 07/27/2015   Obesity, Class III, BMI 40-49.9 (morbid obesity) 07/27/2015   Vitamin  D deficiency Q000111Q   Diastolic heart failure Q000111Q    Palliative Care Assessment & Plan    Assessment/Recommendations/Plan  Patient did have capacity on my evaluation today to make decision to continue vs stop dialysis. I spoke with her alone today to avoid concerns about family influencing her communication with providers Continue fulls cope including dialysis treatment   Code Status: Full code  Prognosis:  Unable to determine  Discharge Planning: To Be Determined  Care plan was discussed with patient, family, and care team.   Thank you for allowing the Palliative Medicine Team to assist in the care of this patient.  Total time:  55 minutes  Greater than 50%  of this time was spent counseling and coordinating care related to the above assessment and plan.  Mariana Kaufman, AGNP-C Palliative Medicine   Please contact Palliative Medicine Team phone at [phone] for questions and concerns.

## 2022-10-26 NOTE — Progress Notes (Signed)
Multiple alarms for High venous pressures; Flushed venous line. Pushing and pulling well.  1530: Multiple alarms for high arterial pressure. Line is not pulling well. Repositioned needle.

## 2022-10-26 NOTE — Progress Notes (Signed)
[facility]  ROUNDING NOTE   Subjective:   Cynthia Dean is a 72 y.o. 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 She presents to the ED with worsening lower extremity pain. Patient will be admitted for Back pain [M54.9] Bilateral leg pain [M79.604, M79.605]  Patient is known to our practice and receives outpatient dialysis treatments at [facility] on a MWF schedule, supervised by Dr Candiss Norse.   Patient seen laying in bed, nurse aide at bedside assisting with ADLs Patient alert and oriented to self Currently agreeable to proceed with dialysis later today  Objective:  Vital signs in last 24 hours:  Temp:  [97.4 F (36.3 C)-97.8 F (36.6 C)] 97.6 F (36.4 C) (04/03 0757) Pulse Rate:  [75-90] 75 (04/03 0757) Resp:  [16-18] 18 (04/03 0757) BP: (127-154)/(62-82) 133/73 (04/03 0757) SpO2:  [94 %-100 %] 100 % (04/03 0757)  Weight change:  Filed Weights   10/20/22 1413 10/21/22 0820 10/21/22 1223  Weight: 93.6 kg 94.2 kg 95.4 kg    Intake/Output: I/O last 3 completed shifts: In: 270 [P.O.:220; IV Piggyback:50] Out: -    Intake/Output this shift:  No intake/output data recorded.  Physical Exam: General: NAD, laying in bed  Head: Normocephalic, atraumatic.  Moist oral mucosal membranes  Eyes: Anicteric  Lungs:  Clear to auscultation, normal effort  Heart: Regular rate and rhythm  Abdomen:  Soft, nontender  Extremities: no peripheral edema.  Neurologic: Alert and oriented to self  Skin: No lesions  Access: Left AVG    Basic Metabolic Panel: Recent Labs  Lab 10/20/22 0637 10/21/22 0358 10/22/22 0530 10/24/22 0445 10/26/22 0528  NA 142 137 137 137 139  K 4.8 2.8* 3.4* 3.1* 3.6  CL 103 99 100 100 104  CO2 23 28 27 25 23   GLUCOSE 104* 106* 123* 172* 156*  BUN 53* 28* 19 36* 52*  CREATININE 12.21* 7.05* 5.14*  8.27* 10.68*  CALCIUM 9.9 9.2 9.9 10.1 10.3  MG 2.2 1.9 1.9 2.0 2.2  PHOS  --   --   --  4.2  --      Liver Function Tests: Recent Labs  Lab 10/24/22 0445  ALBUMIN 2.4*    No results for input(s): "LIPASE", "AMYLASE" in the last 168 hours. No results for input(s): "AMMONIA" in the last 168 hours.  CBC: Recent Labs  Lab 10/20/22 0637 10/21/22 0358 10/22/22 0530 10/24/22 0445 10/26/22 0528  WBC 6.9 7.3 6.7 8.3 7.9  HGB 9.5* 9.4* 9.6* 9.6* 9.3*  HCT 33.1* 32.2* 32.5* 32.4* 31.5*  MCV 83.0 81.7 80.4 81.0 81.4  PLT 177 154 142* 192 178     Cardiac Enzymes: No results for input(s): "CKTOTAL", "CKMB", "CKMBINDEX", "TROPONINI" in the last 168 hours.  BNP: Invalid input(s): "POCBNP"  CBG: Recent Labs  Lab 10/23/22 0831 10/23/22 1156 10/23/22 1740 10/24/22 1651 10/24/22 1947  GLUCAP 118* 114* 161* 148* 130*     Microbiology: Results for orders placed or performed during the hospital encounter of 10/12/22  Blood culture (routine x 2)     Status: None   Collection Time: 10/12/22  5:40 PM   Specimen: BLOOD  Result Value Ref Range Status   Specimen Description BLOOD BLOOD RIGHT HAND [employer]  Final   Special Requests   Final    BOTTLES DRAWN AEROBIC AND ANAEROBIC Blood Culture results may not be optimal due to an inadequate volume of blood  received in culture bottles   Culture   Final    NO GROWTH 5 DAYS Performed at [facility], [address] [place], [place] [address]    Report Status 10/17/2022 FINAL  Final  Blood culture (routine x 2)     Status: None   Collection Time: 10/12/22  6:56 PM   Specimen: BLOOD  Result Value Ref Range Status   Specimen Description BLOOD BLOOD RIGHT FOREARM  Final   Special Requests   Final    BOTTLES DRAWN AEROBIC ONLY Blood Culture results may not be optimal due to an inadequate volume of blood received in culture bottles   Culture   Final    NO GROWTH 5 DAYS Performed at [facility], [address]  [place], [place] [address]    Report Status 10/17/2022 FINAL  Final  Aerobic/Anaerobic Culture w Gram Stain (surgical/deep wound)     Status: None   Collection Time: 10/13/22 11:44 AM   Specimen: Abscess  Result Value Ref Range Status   Specimen Description   Final    ABSCESS Performed at [facility], [address], [place], [place] [address]    Special Requests   Final    MUSCLE ABSCESS Performed at [facility], [address] [place], [place] [address]    Gram Stain   Final    ABUNDANT WBC PRESENT, PREDOMINANTLY PMN NO ORGANISMS SEEN    Culture   Final    FEW STAPHYLOCOCCUS CAPITIS NO ANAEROBES ISOLATED Performed at [facility], [place] [address] [place], [place] [address]    Report Status 10/18/2022 FINAL  Final   Organism ID, Bacteria STAPHYLOCOCCUS CAPITIS  Final      Susceptibility   Staphylococcus capitis - MIC*    CIPROFLOXACIN <=0.5 SENSITIVE Sensitive     ERYTHROMYCIN >=8 RESISTANT Resistant     GENTAMICIN <=0.5 SENSITIVE Sensitive     OXACILLIN <=0.25 SENSITIVE Sensitive     TETRACYCLINE <=1 SENSITIVE Sensitive     VANCOMYCIN 1 SENSITIVE Sensitive     TRIMETH/SULFA <=10 SENSITIVE Sensitive     CLINDAMYCIN INTERMEDIATE Intermediate     RIFAMPIN <=0.5 SENSITIVE Sensitive     Inducible Clindamycin NEGATIVE Sensitive     * FEW STAPHYLOCOCCUS CAPITIS    Coagulation Studies: No results for input(s): "LABPROT", "INR" in the last 72 hours.  Urinalysis: No results for input(s): "COLORURINE", "LABSPEC", "PHURINE", "GLUCOSEU", "HGBUR", "BILIRUBINUR", "KETONESUR", "PROTEINUR", "UROBILINOGEN", "NITRITE", "LEUKOCYTESUR" in the last 72 hours.  Invalid input(s): "APPERANCEUR"    Imaging: No results found.   Medications:    anticoagulant sodium citrate      ceFAZolin (ANCEF) IV 1 g (10/25/22 2140)   methocarbamol (ROBAXIN) IV 500 mg (10/25/22 2043)    (feeding supplement) PROSource Plus  30 mL Oral BID BM   acetaminophen  1,000 mg Oral Q8H    atorvastatin  80 mg Oral QHS   Chlorhexidine Gluconate Cloth  6 each Topical Q0600   epoetin (EPOGEN/PROCRIT) injection  10,000 Units Intravenous Q M,W,F-HD   feeding supplement (NEPRO CARB STEADY)  237 mL Oral TID BM   heparin injection (subcutaneous)  5,000 Units Subcutaneous Q8H   levETIRAcetam  1,000 mg Oral Daily   levETIRAcetam  250 mg Oral Q M,W,F   multivitamin  1 tablet Oral QHS   nystatin  5 mL Oral QID   pantoprazole  20 mg Oral BID   QUEtiapine  50 mg Oral QHS   sertraline  25 mg Oral Daily  sodium chloride flush  10-40 mL Intracatheter Q12H   umeclidinium-vilanterol  1 puff Inhalation Daily   alteplase, anticoagulant sodium citrate, heparin, hydrALAZINE, HYDROmorphone, lidocaine (PF), lidocaine-prilocaine, LORazepam, methocarbamol (ROBAXIN) IV, pentafluoroprop-tetrafluoroeth, senna-docusate, sodium chloride flush  Assessment/ Plan:  Ms. Kathrynne Ekblad is a 72 y.o.  female with end stage renal disease on hemodialysis, coronary artery disease, COPD with intermittent home O2, hypertension, hyperlipidemia, stroke, diabetes mellitus type II, and breast cancer who was admitted to [facility] on 10/12/2022 for Back pain [M54.9] Bilateral leg pain [M79.604, M79.605]  Patient was found to have vertebral osteomyelitis and psoas abscess. Cultures positive for staph capitis  [facility] MWF Left AVG 102.5kg  End-stage renal disease on hemodialysis. Resumed on MWF schedule.  Scheduled to receive dialysis today, UF goal 1 L as tolerated.  Patient was supposed to receive dialysis seated in recliner, this was lost in translation. Will encourage nursing staff to sit patient in chair for at least 4 consecutive hours as is required for hemodialysis.  Next treatment scheduled for Friday.  2. Anemia of chronic kidney disease Normocytic Lab Results  Component Value Date   HGB 9.3 (L) 10/26/2022  Patient receives Mircera at outpatient clinic.  Continue EPO with dialysis  treatments.   3. Secondary Hyperparathyroidism: with outpatient labs: PTH 180 (3/11), phosphorus 5.4, calcium 9.3   Lab Results  Component Value Date   PTH 131 (H) 03/14/2018   CALCIUM 10.3 10/26/2022   CAION 1.09 (L) 08/28/2022   PHOS 4.2 10/24/2022  Will continue to monitor bone minerals during this admission.  Not currently on binders or vitamin D agent.   4.  Hypotension on hemodialysis treatment. Holding home regimen of losartan, metoprolol and torsemide. Blood pressure stable.  5. Diabetes mellitus type II with chronic kidney disease/renal manifestations: insulin dependent. Home regimen includes Novolin 70/30. Most recent hemoglobin A1c is 5.2% on admission.   Glucose well-controlled.  Primary team to manage sliding scale insulin.  6. Vertebral osteomyelitis, seen on CT lumber spine. Neurosurgery feels patient is not a surgical candidate CT guided aspiration of psoas abscess on 10/13/22. Cultures positive for Staph Capitis.   - Will continue Cefazolin with hemodialysis for 8 weeks. Appreciate Infectious Disease input.    LOS: [place] 4/3/202411:36 AM

## 2022-10-26 NOTE — Progress Notes (Signed)
PROGRESS NOTE    Cynthia Dean  [id number] DOB: September 03, 1950 DOA: 10/12/2022 PCP: Lowella Bandy, MD  [id number]  LOS: 14 days   Brief hospital course:   Assessment & Plan: Dakyra Washam is a 72 y/o F w/ PMH of ESRD on HD, COPD, DM2, HTN, depression, CVA who presented w/ back pain x 3 weeks.   Pt presented w/ back pain and was found to intradiscal abscess/lumbar discitis/b/l psoas abscess on MRI.   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  Wound cx growing staph capitis.  ID consulted.  Abx de-escalated to cefazolin.  Not a good surgical candidate as per neuro surg.   --cont cefazolin, for 6 weeks, End Date: 12/02/22   Confusion and agitation Hospital delirium --Pt was agitated, and also not responding to questions or commands during early hospitalization.  Mental status did start to improve, and now pt is calm and interactive, however, still seems confused and not totally coherent.  Pt reportedly was confused at baseline in recent months, and daughter was her Media planner.  Pt did not appear to have decision-making capacity. --cont seroquel 50 mg nightly (new) to help with delirium and sleep.  ESRD: on HD MWF.  Pt refused HD on 10/17/22, 10/18/22 and 3/27, 4/1. --palliative consulted, currently family requests full scope of treatment.   --Pt agreeable to dialysis today (4/3) --Pt HAS capacity to make her decisions.     She has does however have waxing/waning changes in mentation and orientation likely related to hospital delirium and metabolic derangements of renal failure. --Dr. Billie Ruddy consulted ethic committee 4/2 --[employer] is following -- see their detailed notes related to capacity   Generalized weakness:  PT recs SNF    ACD:  likely secondary ESRD. H&H are stable    Hypokalemia:  --monitor and replete PRN with  oral   Hx of seizures:  continue on home dose of keppra   HLD:  continue on statin    Hx of COPD:  w/o exacerbation.  Continue on bronchodilators    DM2: well controlled HbA1c 5.2.  --d/c'ed BG checks    HTN:  --hold losartan and Toprol 2/2 hypotension   GERD:  continue on PPI    Depression: severity unknown.  Continue on home dose of sertraline     Overweight: BMI 28.2.  Would benefit from weight loss   Right foot pain --pt reported severity of pain not matching her exam. --oral dilaudid 1 mg q6h PRN ordered by palliative care provider   DVT prophylaxis: Heparin SQ Code Status: Full code  Family Communication: boyfriend updated at bedside by Dr. Billie Ruddy yesterday.  None at bedside but will attempt to call this afternoon  Level of care: Telemetry Medical  Dispo:   The patient is from: home Anticipated d/c is to: SNF rehab Anticipated d/c date is: undetermined, will need outpatient dialysis chair time   Subjective and Interval History:  Pt was seen resting in bed this AM.  We discussed about dialysis and patient was able to tell me she does not want to die, is not ready, and understands need for dialysis.  She is currently agreeable to dialysis.  She states she "wants to do whatever you tell me", and "don't want to make any one mad".  She knows her family want her to survive and she expressed will and desire to live even having  to do dialysis.   Objective: Vitals:   10/26/22 0524 10/26/22 0757 10/26/22 1151 10/26/22 1202  BP: (!) 154/75 133/73  (!) 163/71  Pulse: 77 75  73  Resp: 18 18  12   Temp: (!) 97.4 F (36.3 C) 97.6 F (36.4 C)    TempSrc: Oral     SpO2: 100% 100%  93%  Weight:   94.6 kg   Height:        Intake/Output Summary (Last 24 hours) at 10/26/2022 1227 Last data filed at 10/25/2022 1507 Gross per 24 hour  Intake 0 ml  Output --  Net 0 ml   Filed Weights   10/21/22 0820 10/21/22 1223 10/26/22 1151  Weight: 94.2 kg 95.4 kg 94.6 kg     Examination:   General exam: awake, alert, no acute distress HEENT: moist mucus membranes, hearing grossly normal  Respiratory system: CTAB diminished bases, no wheezes, rales or rhonchi, normal respiratory effort. Cardiovascular system: normal S1/S2, RRR, no pedal edema, port noted right upper chest wall Gastrointestinal system: soft, NT, ND  Central nervous system: A&O x3. no gross focal neurologic deficits, normal speech Extremities: LUE fistula noted, no edema, normal tone Skin: dry, intact, normal temperature Psychiatry: normal mood, congruent affect, judgement and insight appear normal    Data Reviewed: I have personally reviewed labs and imaging studies  Time spent: 35 minutes  Ezekiel Slocumb, DO [facility] If 7PM-7AM, please contact night-coverage 10/26/2022, 12:27 PM

## 2022-10-27 DIAGNOSIS — Z992 Dependence on renal dialysis: Secondary | ICD-10-CM | POA: Diagnosis not present

## 2022-10-27 DIAGNOSIS — N186 End stage renal disease: Secondary | ICD-10-CM | POA: Diagnosis not present

## 2022-10-27 DIAGNOSIS — M4646 Discitis, unspecified, lumbar region: Secondary | ICD-10-CM | POA: Diagnosis not present

## 2022-10-27 DIAGNOSIS — D638 Anemia in other chronic diseases classified elsewhere: Secondary | ICD-10-CM | POA: Diagnosis not present

## 2022-10-27 DIAGNOSIS — K6812 Psoas muscle abscess: Secondary | ICD-10-CM | POA: Diagnosis not present

## 2022-10-27 LAB — GLUCOSE, CAPILLARY: Glucose-Capillary: 123 mg/dL — ABNORMAL HIGH (ref 70–99)

## 2022-10-27 MED ORDER — METOPROLOL SUCCINATE ER 50 MG PO TB24
100.0000 mg | ORAL_TABLET | Freq: Every day | ORAL | Status: DC
  Administered 2022-10-27 – 2022-11-27 (×23): 100 mg via ORAL
  Filled 2022-10-27 (×26): qty 2

## 2022-10-27 MED ORDER — GABAPENTIN 100 MG PO CAPS
100.0000 mg | ORAL_CAPSULE | Freq: Every day | ORAL | Status: DC
  Administered 2022-10-27 – 2022-11-09 (×13): 100 mg via ORAL
  Filled 2022-10-27 (×14): qty 1

## 2022-10-27 NOTE — NC FL2 (Signed)
[place] LEVEL OF CARE FORM     IDENTIFICATION  Patient Name: Cynthia Dean Birthdate: [birth year] Sex: female Admission Date (Current Location): 10/12/2022  [place] and [place] Number:  Engineering geologist and Address:  [facility], [address] [place],  [address]      Provider Number: [id number]  Attending Physician Name and Address:  Ezekiel Slocumb, DO  Relative Name and Phone Number:  Terree, Halsell (Daughter) [phone]    Current Level of Care: Hospital Recommended Level of Care: [facility] Prior Approval Number:    Date Approved/Denied:   PASRR Number: [id number]  Discharge Plan: SNF    Current Diagnoses: Patient Active Problem List   Diagnosis Date Noted   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/17/2022   Back pain 10/12/2022   Discitis of lumbar region 10/12/2022   Myositis 10/12/2022   Hemoglobin drop 08/28/2022   Chronic respiratory failure with hypoxia 08/26/2022   Acute on chronic blood loss anemia 08/26/2022   History of lower GI bleeding 08/26/2022   Insulin-requiring or dependent type II diabetes mellitus 08/26/2022   Chronic anticoagulation 08/26/2022   History of pulmonary embolism 03/20/2022 08/26/2022   Breast cancer, left S/P partial mastectomy,03/04/2022 08/26/2022   Generalized weakness    Unable to ambulate 05/09/2022   ESRD on dialysis    Bilateral leg pain    Depression    Pulmonary emboli    Obesity with body mass index (BMI) of 30.0 to 39.9    Anemia    Chronic diastolic CHF (congestive heart failure)    Seroma of breast    Bacteremia due to coagulase-negative Staphylococcus    Left breast abscess    Urinary tract infection 03/22/2022   Anemia of chronic disease 03/22/2022   Acute pulmonary embolism without acute cor pulmonale 03/20/2022   Dyslipidemia 03/20/2022   Diabetic neuropathy 03/20/2022   Sepsis due to cellulitis 03/20/2022   Delay kidney tx func d/t fluid  overload requiring acute dialysis 01/29/2020   Delay kidney tx func d/t ATN and fluid overload require acute dialysis 01/29/2020   Hypoglycemia 10/21/2019   Unresponsiveness 10/21/2019   GERD (gastroesophageal reflux disease) 10/21/2019   Hypothermia    Sepsis with encephalopathy without septic shock    Diarrhea    Acute metabolic encephalopathy    Acute on chronic diastolic CHF (congestive heart failure)    Acute on chronic respiratory failure with hypoxia 07/30/2019   Rectal bleed 02/13/2019   Acute respiratory failure with hypoxia 12/31/2018   History of CVA (cerebrovascular accident) 05/29/2018   Aphasia as late effect of cerebrovascular accident (CVA) 05/01/2018   Seizure disorder 03/14/2018   Stroke 03/07/2018   Slurred speech 11/14/2017   Type 2 diabetes mellitus with ESRD (end-stage renal disease) 11/12/2017   CAD (coronary artery disease) 11/12/2017   COPD (chronic obstructive pulmonary disease) 11/12/2017   ESRD on hemodialysis 11/12/2017   CVA (cerebral vascular accident) 04/14/2016   Essential hypertension 11/27/2015   Hyperlipidemia 11/27/2015   Prolonged Q-T interval on ECG [id number]   Aphasia complicating stroke [id number]   Cervical spine arthritis 07/27/2015   Diabetic retinopathy without macular edema associated with type 2 diabetes mellitus 07/27/2015   Osteoarthritis of spine with radiculopathy, lumbar region 07/27/2015   Obesity, Class III, BMI 40-49.9 (morbid obesity) 07/27/2015   Vitamin D deficiency Q000111Q   Diastolic heart failure Q000111Q    Orientation RESPIRATION BLADDER Height & Weight     Self, Place  O2 Incontinent Weight: 205 lb 14.6 oz (93.4  kg) Height:  6' (182.9 cm)  BEHAVIORAL SYMPTOMS/MOOD NEUROLOGICAL BOWEL NUTRITION STATUS    Convulsions/Seizures Incontinent Diet (regular)  AMBULATORY STATUS COMMUNICATION OF NEEDS Skin   Extensive Assist Verbally  (Dermatitis L buttocks foam dressing.)                       Personal  Care Assistance Level of Assistance  Bathing, Dressing, Feeding Bathing Assistance: Maximum assistance Feeding assistance: Maximum assistance Dressing Assistance: Maximum assistance     Functional Limitations Info  Sight, Hearing, Speech Sight Info: Adequate Hearing Info: Adequate Speech Info: Adequate    SPECIAL CARE FACTORS FREQUENCY  OT (By licensed OT), PT (By licensed PT)     PT Frequency: 5 times a week OT Frequency: 5 times a week            Contractures Contractures Info: Not present    Additional Factors Info  Code Status, Allergies Code Status Info: FULL Allergies Info: Oxycodone  Oxycodone-acetaminophen  Wound Dressing Adhesive  Hydrocodone  Tape  Tapentadol           Current Medications (10/27/2022):  This is the current hospital active medication list Current Facility-Administered Medications  Medication Dose Route Frequency Provider Last Rate Last Admin   (feeding supplement) PROSource Plus liquid 30 mL  30 mL Oral BID BM Wyvonnia Dusky, MD   30 mL at 10/27/22 1011   acetaminophen (TYLENOL) tablet 1,000 mg  1,000 mg Oral Q8H Knox Royalty, NP   1,000 mg at 10/27/22 1010   alteplase (CATHFLO ACTIVASE) injection 2 mg  2 mg Intracatheter Once PRN Colon Flattery, NP       anticoagulant sodium citrate solution 5 mL  5 mL Intracatheter PRN Colon Flattery, NP       atorvastatin (LIPITOR) tablet 80 mg  80 mg Oral QHS Wyvonnia Dusky, MD   80 mg at 10/26/22 2151   ceFAZolin (ANCEF) IVPB 1 g/50 mL premix  1 g Intravenous Q24H Berton Mount, RPH 100 mL/hr at 10/26/22 2151 1 g at 10/26/22 2151   Chlorhexidine Gluconate Cloth 2 % PADS 6 each  6 each Topical Q0600 Enzo Bi, MD   6 each at 10/27/22 0534   epoetin alfa (EPOGEN) injection 10,000 Units  10,000 Units Intravenous Q M,W,F-HD Kolluru, Sarath, MD   10,000 Units at 10/26/22 1206   feeding supplement (NEPRO CARB STEADY) liquid 237 mL  237 mL Oral TID BM Wyvonnia Dusky, MD   237 mL at 10/27/22  1011   heparin injection 1,000 Units  1,000 Units Intracatheter PRN Colon Flattery, NP       heparin injection 5,000 Units  5,000 Units Subcutaneous Q8H Wyvonnia Dusky, MD   5,000 Units at 10/27/22 1258   hydrALAZINE (APRESOLINE) injection 20 mg  20 mg Intravenous Q6H PRN Wyvonnia Dusky, MD       HYDROmorphone (DILAUDID) tablet 1 mg  1 mg Oral Q6H PRN Knox Royalty, NP   1 mg at 10/27/22 1021   levETIRAcetam (KEPPRA) tablet 1,000 mg  1,000 mg Oral Daily Wyvonnia Dusky, MD   1,000 mg at 10/27/22 1010   levETIRAcetam (KEPPRA) tablet 250 mg  250 mg Oral Q M,W,F Wyvonnia Dusky, MD   250 mg at 10/26/22 0759   lidocaine (PF) (XYLOCAINE) 1 % injection 5 mL  5 mL Intradermal PRN Colon Flattery, NP       lidocaine-prilocaine (EMLA) cream 1 Application  1 Application Topical PRN Breeze,  [place], NP       LORazepam (ATIVAN) tablet 1 mg  1 mg Oral Q6H PRN Knox Royalty, NP   1 mg at 10/27/22 1258   methocarbamol (ROBAXIN) 500 mg in dextrose 5 % 50 mL IVPB  500 mg Intravenous Q8H PRN Enzo Bi, MD 110 mL/hr at 10/25/22 2043 500 mg at 10/25/22 2043   metoprolol succinate (TOPROL-XL) 24 hr tablet 100 mg  100 mg Oral Daily Kolluru, Sarath, MD       multivitamin (RENA-VIT) tablet 1 tablet  1 tablet Oral QHS Wyvonnia Dusky, MD   1 tablet at 10/26/22 2152   nystatin (MYCOSTATIN) 100000 UNIT/ML suspension 500,000 Units  5 mL Oral QID Tsosie Billing, MD   500,000 Units at 10/27/22 1011   pantoprazole (PROTONIX) EC tablet 20 mg  20 mg Oral BID Wyvonnia Dusky, MD   20 mg at 10/27/22 1010   pentafluoroprop-tetrafluoroeth (GEBAUERS) aerosol 1 Application  1 Application Topical PRN Colon Flattery, NP       QUEtiapine (SEROQUEL) tablet 50 mg  50 mg Oral QHS Enzo Bi, MD   50 mg at 10/26/22 2153   senna-docusate (Senokot-S) tablet 1 tablet  1 tablet Oral QHS PRN Wyvonnia Dusky, MD       sertraline (ZOLOFT) tablet 25 mg  25 mg Oral Daily Wyvonnia Dusky, MD   25 mg  at 10/27/22 1010   sodium chloride flush (NS) 0.9 % injection 10-40 mL  10-40 mL Intracatheter Q12H Wyvonnia Dusky, MD   10 mL at 10/27/22 1013   sodium chloride flush (NS) 0.9 % injection 10-40 mL  10-40 mL Intracatheter PRN Wyvonnia Dusky, MD   10 mL at 10/24/22 2102   umeclidinium-vilanterol (ANORO ELLIPTA) 62.5-25 MCG/ACT 1 puff  1 puff Inhalation Daily Wyvonnia Dusky, MD   1 puff at 10/27/22 1012     Discharge Medications: Please see discharge summary for a list of discharge medications.  Relevant Imaging Results:  Relevant Lab Results:   Additional Information SS- [id number]  Gerilyn Pilgrim, LCSW

## 2022-10-27 NOTE — Progress Notes (Signed)
[facility]  ROUNDING NOTE   Subjective:   Cynthia Dean is a 72 y.o. 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 She presents to the ED with worsening lower extremity pain. Patient will be admitted for Back pain [M54.9] Bilateral leg pain [M79.604, M79.605]  Patient is known to our practice and receives outpatient dialysis treatments at [facility] on a MWF schedule, supervised by Dr Candiss Norse.   Patient seen resting in bed, no family at bedside Alert and oriented to self and place Continues to complain of lower extremity pain States right greater than left however pain in both legs  Dialysis tolerated well yesterday  Objective:  Vital signs in last 24 hours:  Temp:  [98 F (36.7 C)-98.4 F (36.9 C)] 98.4 F (36.9 C) (04/04 0800) Pulse Rate:  [74-98] 98 (04/04 0800) Resp:  [6-20] 18 (04/04 0800) BP: (114-159)/(60-83) 156/76 (04/04 0800) SpO2:  [94 %-100 %] 96 % (04/04 0800) Weight:  [93.4 kg] 93.4 kg (04/03 1627)  Weight change:  Filed Weights   10/21/22 1223 10/26/22 1151 10/26/22 1627  Weight: 95.4 kg 94.6 kg 93.4 kg    Intake/Output: I/O last 3 completed shifts: In: -  Out: 1500 [Other:1500]   Intake/Output this shift:  No intake/output data recorded.  Physical Exam: General: NAD, laying in bed  Head: Normocephalic, atraumatic.  Moist oral mucosal membranes  Eyes: Anicteric  Lungs:  Clear to auscultation, normal effort  Heart: Regular rate and rhythm  Abdomen:  Soft, nontender  Extremities: no peripheral edema.  Neurologic: Alert and oriented to self  Skin: No lesions  Access: Left AVG    Basic Metabolic Panel: Recent Labs  Lab 10/21/22 0358 10/22/22 0530 10/24/22 0445 10/26/22 0528  NA 137 137 137 139  K 2.8* 3.4* 3.1* 3.6  CL 99 100 100 104  CO2 28 27 25 23   GLUCOSE 106* 123* 172* 156*   BUN 28* 19 36* 52*  CREATININE 7.05* 5.14* 8.27* 10.68*  CALCIUM 9.2 9.9 10.1 10.3  MG 1.9 1.9 2.0 2.2  PHOS  --   --  4.2  --      Liver Function Tests: Recent Labs  Lab 10/24/22 0445  ALBUMIN 2.4*    No results for input(s): "LIPASE", "AMYLASE" in the last 168 hours. No results for input(s): "AMMONIA" in the last 168 hours.  CBC: Recent Labs  Lab 10/21/22 0358 10/22/22 0530 10/24/22 0445 10/26/22 0528  WBC 7.3 6.7 8.3 7.9  HGB 9.4* 9.6* 9.6* 9.3*  HCT 32.2* 32.5* 32.4* 31.5*  MCV 81.7 80.4 81.0 81.4  PLT 154 142* 192 178     Cardiac Enzymes: No results for input(s): "CKTOTAL", "CKMB", "CKMBINDEX", "TROPONINI" in the last 168 hours.  BNP: Invalid input(s): "POCBNP"  CBG: Recent Labs  Lab 10/23/22 1740 10/24/22 1651 10/24/22 1947 10/26/22 2030 10/27/22 0758  GLUCAP 161* 148* 130* 132* 39*     Microbiology: Results for orders placed or performed during the hospital encounter of 10/12/22  Blood culture (routine x 2)     Status: None   Collection Time: 10/12/22  5:40 PM   Specimen: BLOOD  Result Value Ref Range Status   Specimen Description BLOOD BLOOD RIGHT HAND [facility]  Final   Special Requests   Final    BOTTLES DRAWN AEROBIC AND ANAEROBIC Blood Culture results may not be optimal due to an inadequate volume of blood received in culture  bottles   Culture   Final    NO GROWTH 5 DAYS Performed at [facility], [address] [place], [place] [address]    Report Status 10/17/2022 FINAL  Final  Blood culture (routine x 2)     Status: None   Collection Time: 10/12/22  6:56 PM   Specimen: BLOOD  Result Value Ref Range Status   Specimen Description BLOOD BLOOD RIGHT FOREARM  Final   Special Requests   Final    BOTTLES DRAWN AEROBIC ONLY Blood Culture results may not be optimal due to an inadequate volume of blood received in culture bottles   Culture   Final    NO GROWTH 5 DAYS Performed at [facility], [address]  [place], [place] [address]    Report Status 10/17/2022 FINAL  Final  Aerobic/Anaerobic Culture w Gram Stain (surgical/deep wound)     Status: None   Collection Time: 10/13/22 11:44 AM   Specimen: Abscess  Result Value Ref Range Status   Specimen Description   Final    ABSCESS Performed at [facility], [address] [place], [place] [address]    Special Requests   Final    MUSCLE ABSCESS Performed at [facility], [address] [place], [place] [address]    Gram Stain   Final    ABUNDANT WBC PRESENT, PREDOMINANTLY PMN NO ORGANISMS SEEN    Culture   Final    FEW STAPHYLOCOCCUS CAPITIS NO ANAEROBES ISOLATED Performed at [facility], [place] [address], [place], [place] [address]    Report Status 10/18/2022 FINAL  Final   Organism ID, Bacteria STAPHYLOCOCCUS CAPITIS  Final      Susceptibility   Staphylococcus capitis - MIC*    CIPROFLOXACIN <=0.5 SENSITIVE Sensitive     ERYTHROMYCIN >=8 RESISTANT Resistant     GENTAMICIN <=0.5 SENSITIVE Sensitive     OXACILLIN <=0.25 SENSITIVE Sensitive     TETRACYCLINE <=1 SENSITIVE Sensitive     VANCOMYCIN 1 SENSITIVE Sensitive     TRIMETH/SULFA <=10 SENSITIVE Sensitive     CLINDAMYCIN INTERMEDIATE Intermediate     RIFAMPIN <=0.5 SENSITIVE Sensitive     Inducible Clindamycin NEGATIVE Sensitive     * FEW STAPHYLOCOCCUS CAPITIS    Coagulation Studies: No results for input(s): "LABPROT", "INR" in the last 72 hours.  Urinalysis: No results for input(s): "COLORURINE", "LABSPEC", "PHURINE", "GLUCOSEU", "HGBUR", "BILIRUBINUR", "KETONESUR", "PROTEINUR", "UROBILINOGEN", "NITRITE", "LEUKOCYTESUR" in the last 72 hours.  Invalid input(s): "APPERANCEUR"    Imaging: No results found.   Medications:    anticoagulant sodium citrate      ceFAZolin (ANCEF) IV 1 g (10/26/22 2151)   methocarbamol (ROBAXIN) IV 500 mg (10/25/22 2043)    (feeding supplement) PROSource Plus  30 mL Oral BID BM   acetaminophen  1,000 mg Oral Q8H    atorvastatin  80 mg Oral QHS   Chlorhexidine Gluconate Cloth  6 each Topical Q0600   epoetin (EPOGEN/PROCRIT) injection  10,000 Units Intravenous Q M,W,F-HD   feeding supplement (NEPRO CARB STEADY)  237 mL Oral TID BM   heparin injection (subcutaneous)  5,000 Units Subcutaneous Q8H   levETIRAcetam  1,000 mg Oral Daily   levETIRAcetam  250 mg Oral Q M,W,F   multivitamin  1 tablet Oral QHS   nystatin  5 mL Oral QID   pantoprazole  20 mg Oral BID   QUEtiapine  50 mg Oral QHS   sertraline  25 mg Oral Daily   sodium chloride flush  10-40 mL Intracatheter Q12H   umeclidinium-vilanterol  1 puff Inhalation Daily   alteplase, anticoagulant sodium citrate, heparin, hydrALAZINE, HYDROmorphone, lidocaine (PF), lidocaine-prilocaine, LORazepam, methocarbamol (ROBAXIN) IV, pentafluoroprop-tetrafluoroeth, senna-docusate, sodium chloride flush  Assessment/ Plan:  [name] is a 72 y.o.  female with end stage renal disease on hemodialysis, coronary artery disease, COPD with intermittent home O2, hypertension, hyperlipidemia, stroke, diabetes mellitus type II, and breast cancer who was admitted to [facility] on 10/12/2022 for Back pain [M54.9] Bilateral leg pain [M79.604, M79.605]  Patient was found to have vertebral osteomyelitis and psoas abscess. Cultures positive for staph capitis  [facility] MWF Left AVG 102.5kg  End-stage renal disease on hemodialysis. Resumed on MWF schedule.  Patient received dialysis yesterday, UF 1 L achieved.  Next treatment scheduled for Friday. Patient will be placed in recliner for this treatment.  2. Anemia of chronic kidney disease Normocytic Lab Results  Component Value Date   HGB 9.3 (L) 10/26/2022  Patient receives Mircera at outpatient clinic.  Hemoglobin within optimal range.  Continue EPO with dialysis treatments.   3. Secondary Hyperparathyroidism: with outpatient labs: PTH 180 (3/11), phosphorus 5.4, calcium 9.3   Lab Results   Component Value Date   PTH 131 (H) 03/14/2018   CALCIUM 10.3 10/26/2022   CAION 1.09 (L) 08/28/2022   PHOS 4.2 10/24/2022  Calcium and phosphorus within optimal range.  4.  Hypotension on hemodialysis treatment. Holding home regimen of losartan, metoprolol and torsemide. Blood pressure 156/76, acceptable for this patient.  5. Diabetes mellitus type II with chronic kidney disease/renal manifestations: insulin dependent. Home regimen includes Novolin 70/30. Most recent hemoglobin A1c is 5.2% on admission.   Sliding scale managed by primary team.  6. Vertebral osteomyelitis, seen on CT lumber spine. Neurosurgery feels patient is not a surgical candidate CT guided aspiration of psoas abscess on 10/13/22. Cultures positive for Staph Capitis.   - Continue Cefazolin with hemodialysis for 8 weeks. Appreciate Infectious Disease input.    LOS: [place] 4/4/202412:11 PM

## 2022-10-27 NOTE — Progress Notes (Addendum)
PROGRESS NOTE    Cynthia Dean  [id number] DOB: 1950/09/09 DOA: 10/12/2022 PCP: Lowella Bandy, MD  [id number]/[id number]  LOS: 15 days   Brief hospital course:   Assessment & Plan: Cynthia Dean is a 72 y/o F w/ PMH of ESRD on HD, COPD, DM2, HTN, depression, CVA who presented w/ back pain x 3 weeks.   Pt presented w/ back pain and was found to intradiscal abscess/lumbar discitis/b/l psoas abscess on MRI.   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  Wound cx growing staph capitis.  ID consulted.  Abx de-escalated to cefazolin.  Not a good surgical candidate as per neuro surg.   --cont cefazolin, for 6 weeks, End Date: 12/02/22   Confusion and agitation Hospital delirium --Pt was agitated, and also not responding to questions or commands during early hospitalization.  Mental status did start to improve, and now pt is calm and interactive, however, still seems confused and not totally coherent.  Pt reportedly was confused at baseline in recent months, and daughter was her Media planner.  Pt did not appear to have decision-making capacity. --cont seroquel 50 mg nightly (new) to help with delirium and sleep.  ESRD: on HD MWF.  Pt refused HD on 10/17/22, 10/18/22 and 3/27, 4/1. --palliative consulted, currently family requests full scope of treatment.   --Pt agreeable and underwent dialysis on 4/3 --Palliative Care is following -- see their detailed notes related to capacity --Pt HAS capacity to make her decisions.     She has does however have waxing/waning changes in mentation and orientation likely related to hospital delirium and metabolic derangements of renal failure.    Generalized weakness:  PT recs SNF    ACD:  likely secondary ESRD. H&H are stable    Hypokalemia:  --monitor and replete PRN with oral  Peripheral neuropathy --   Cautious trial of gabapentin 100 mg daily (ESRD - monitor closely for side effects)   Hx of seizures:  continue on home dose of keppra   HLD:  continue on statin    Hx of COPD:  w/o exacerbation.  Continue on bronchodilators    DM2: well controlled HbA1c 5.2.  --d/c'ed BG checks    HTN:  --hold losartan and Toprol 2/2 hypotension   GERD:  continue on PPI    Depression: severity unknown.  Continue on home dose of sertraline     Overweight: BMI 28.2.  Would benefit from weight loss   Right foot pain --pt reported severity of pain not matching her exam. --oral dilaudid 1 mg q6h PRN ordered by palliative care provider   DVT prophylaxis: Heparin SQ Code Status: Full code  Family Communication: boyfriend updated at bedside by Dr. Billie Ruddy yesterday.  None at bedside but will attempt to call this afternoon   Consultants - Nephrology, Infectious Disease, Palliative Care  Level of care: Telemetry Medical  Dispo:   The patient is from: home Anticipated d/c is to: SNF rehab Anticipated d/c date is: undetermined, will need outpatient dialysis chair time   Subjective and Interval History:  Pt was seen resting in bed having lunch today.  She complains of severe pain in both feet that feels like burning.  Otherwise reports doing okay, says dialysis went well yesterday and remains agreeable to continue it.   Objective: Vitals:   10/26/22 1635 10/26/22 1700 10/26/22 2026 10/27/22 0800  BP:  [name])  140/60 135/65 (!) 156/76  Pulse: 88 88 95 98  Resp: 14 18  18   Temp:  98 F (36.7 C) 98.4 F (36.9 C) 98.4 F (36.9 C)  TempSrc:  Oral Oral   SpO2: 98% 100% 100% 96%  Weight:      Height:        Intake/Output Summary (Last 24 hours) at 10/27/2022 1422 Last data filed at 10/26/2022 1627 Gross per 24 hour  Intake --  Output 1500 ml  Net -1500 ml   Filed Weights   10/21/22 1223 10/26/22 1151 10/26/22 1627  Weight: 95.4 kg 94.6 kg 93.4 kg    Examination:   General exam:  awake, alert, no acute distress HEENT: moist mucus membranes, hearing grossly normal  Respiratory system: on room air, normal respiratory effort. Cardiovascular system: normal S1/S2, RRR, no pedal edema, port noted right upper chest wall Gastrointestinal system: soft, NT, ND  Central nervous system: A&O x3. no gross focal neurologic deficits, normal speech Extremities:  bilateral plantar aspect of feet tender on palpation, LUE fistula noted, no edema, normal tone Skin: dry, intact, normal temperature Psychiatry: depressed mood, congruent affect, judgement and insight appear normal    Data Reviewed: I have personally reviewed labs and imaging studies  Time spent: 35 minutes  Ezekiel Slocumb, DO [facility] If 7PM-7AM, please contact night-coverage 10/27/2022, 2:22 PM

## 2022-10-27 NOTE — Progress Notes (Signed)
Physical Therapy Treatment Patient Details Name: Cynthia Dean MRN: [id number] DOB: [birth year] Today's Date: 10/27/2022   History of Present Illness Cynthia Dean is a 72 y.o. 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 She presents to the ED with worsening lower extremity pain.    PT Comments    Pt received in bed. Transferred supine to sit with HOB raised and MaxA. Pt able to maintain sitting balance x 5 minutes with B UE and LE's supported, able to correct L lateral lean due to fatigue. Several attempts to stand from raised bed with RW and MaxA x 1, Pt unable to raise buttocks of bed surface. Appears to be self limiting due to anxiety and acting overly emotional throughout session. Pt assisted back to supine and placed in upright seated position with all needs met. Continue to recommend SNF once cleared for d/c.    Recommendations for follow up therapy are one component of a multi-disciplinary discharge planning process, led by the attending physician.  Recommendations may be updated based on patient status, additional functional criteria and insurance authorization.  Follow Up Recommendations  Can patient physically be transported by private vehicle: No    Assistance Recommended at Discharge Intermittent Supervision/Assistance  Patient can return home with the following Two people to help with walking and/or transfers;A lot of help with bathing/dressing/bathroom;Assistance with cooking/housework;Direct supervision/assist for medications management;Direct supervision/assist for financial management;Assist for transportation;Help with stairs or ramp for entrance   Equipment Recommendations  None recommended by PT (TBD at next facility)    Recommendations for Other Services       Precautions / Restrictions Precautions Precautions:  Fall Restrictions Weight Bearing Restrictions: No     Mobility  Bed Mobility Overal bed mobility: Needs Assistance Bed Mobility: Supine to Sit, Sit to Supine     Supine to sit: Max assist, HOB elevated Sit to supine: Max assist   General bed mobility comments: ModA with bed in trendelenburg to scoot  upright in bed.    Transfers Overall transfer level: Needs assistance Equipment used: Rolling walker (2 wheels) Transfers: Sit to/from Stand Sit to Stand: Total assist (Unable after several attempts, recommend hoyer OOB)           General transfer comment:  (Pt unable to raise buttocks off bed)    Ambulation/Gait               General Gait Details: deferres pt not appropriate.   Stairs             Wheelchair Mobility    Modified Rankin (Stroke Patients Only)       Balance Overall balance assessment: Needs assistance Sitting-balance support: Feet supported, Single extremity supported Sitting balance-Leahy Scale: Fair Sitting balance - Comments:  (Pt sat EOB with supervision x 5 minutes with vc's to self correct L lateral lean)       Standing balance comment: deferred.                            Cognition Arousal/Alertness: Awake/alert Behavior During Therapy: Agitated (very emotional) Overall Cognitive Status: No family/caregiver present to determine baseline cognitive functioning Area of Impairment: Memory, Problem solving                     Memory: Decreased short-term memory       Problem Solving: Slow processing (Calling  out after PT left and explained how to call for help and within reach) General Comments: Pt emotionally labile this date and required Max encouragement from SO to participate. Overall anxious with mobility due to RLE pain.        Exercises Total Joint Exercises Ankle Circles/Pumps: AAROM, Both, 5 reps General Exercises - Lower Extremity Heel Slides: AAROM, Both, 5 reps Hip ABduction/ADduction:  AAROM, Both, 5 reps, Supine Other Exercises Other Exercises: B LE hip IR/ER x 10    General Comments General comments (skin integrity, edema, etc.):  (B lower legs appear equal in color and size. Pt extremely emotional and self limiting)      Pertinent Vitals/Pain Pain Assessment Pain Assessment: Faces Faces Pain Scale: Hurts little more Pain Location: RLE Pain Intervention(s): Limited activity within patient's tolerance, Repositioned, Premedicated before session    Home Living                          Prior Function            PT Goals (current goals can now be found in the care plan section) Acute Rehab PT Goals Patient Stated Goal: get better    Frequency    Min 2X/week      PT Plan Current plan remains appropriate    Co-evaluation              AM-PAC PT "6 Clicks" Mobility   Outcome Measure  Help needed turning from your back to your side while in a flat bed without using bedrails?: A Lot Help needed moving from lying on your back to sitting on the side of a flat bed without using bedrails?: A Lot Help needed moving to and from a bed to a chair (including a wheelchair)?: A Lot Help needed standing up from a chair using your arms (e.g., wheelchair or bedside chair)?: Total Help needed to walk in hospital room?: Total Help needed climbing 3-5 steps with a railing? : Total 6 Click Score: 9    End of Session Equipment Utilized During Treatment: Gait belt Activity Tolerance: Patient limited by pain;Other (comment) (very anxious, emotional, and self limiting) Patient left: in bed;with call bell/phone within reach;with bed alarm set Nurse Communication: Mobility status PT Visit Diagnosis: Unsteadiness on feet (R26.81);Other abnormalities of gait and mobility (R26.89);Muscle weakness (generalized) (M62.81);Difficulty in walking, not elsewhere classified (R26.2)     Time: [id number] PT Time Calculation (min) (ACUTE ONLY): 32 min  Charges:   $Therapeutic Exercise: 8-22 mins $Therapeutic Activity: 8-22 mins                    Mikel Cella, PTA  Josie Dixon 10/27/2022, 1:31 PM

## 2022-10-27 NOTE — TOC Progression Note (Signed)
Transition of Care [facility]) - Progression Note    Patient Details  Name: Cynthia Dean MRN: [id number] Date of Birth: 01-11-1951  Transition of Care [facility]) CM/SW Contact  Gerilyn Pilgrim, LCSW Phone Number: 10/27/2022, 2:10 PM  Clinical Narrative:   CSW spoke with patients daughter who is agreeable to a bed search. Daughter has no preference and would like to review bed offers as they are accepted.          Expected Discharge Plan and Services                                               Social Determinants of Health (SDOH) Interventions SDOH Screenings   Food Insecurity: No Food Insecurity (10/12/2022)  Housing: Low Risk  (10/12/2022)  Transportation Needs: No Transportation Needs (10/12/2022)  Utilities: Not At Risk (10/12/2022)  Financial Resource Strain: Low Risk  (11/12/2017)  Physical Activity: Insufficiently Active (11/12/2017)  Social Connections: Moderately Integrated (11/12/2017)  Stress: No Stress Concern Present (11/12/2017)  Tobacco Use: Medium Risk (10/12/2022)    Readmission Risk Interventions     No data to display

## 2022-10-27 NOTE — Progress Notes (Signed)
Occupational Therapy Treatment Patient Details Name: Sindel Ley MRN: [id number] DOB: May 04, 1951 Today's Date: 10/27/2022   History of present illness Miamarie Armine Jaen is a 72 y.o. 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 She presents to the ED with worsening lower extremity pain.   OT comments  Pt received semi-reclined in bed. Appearing alert; willing to work with OT on t/f to EOB and eating some lunch. T/f MAX A; only able to eat a bite of brownie, then very emotional; appears to be in pain. See flowsheet below for further details of session. Left semi-reclined in bed, family in room, NT's in room, with all needs in reach.     Recommendations for follow up therapy are one component of a multi-disciplinary discharge planning process, led by the attending physician.  Recommendations may be updated based on patient status, additional functional criteria and insurance authorization.    Assistance Recommended at Discharge Frequent or constant Supervision/Assistance  Patient can return home with the following  Two people to help with walking and/or transfers;Help with stairs or ramp for entrance;A lot of help with bathing/dressing/bathroom;Assistance with cooking/housework;Assist for transportation;Direct supervision/assist for financial management;Direct supervision/assist for medications management   Equipment Recommendations  None recommended by OT    Recommendations for Other Services      Precautions / Restrictions Precautions Precautions: Fall Restrictions Weight Bearing Restrictions: No       Mobility Bed Mobility Overal bed mobility: Needs Assistance Bed Mobility: Supine to Sit, Sit to Supine     Supine to sit: Max assist, HOB elevated Sit to supine: Max assist        Transfers                   General transfer  comment: Did not attempted to transfer to standing today. Pt able to scoot at EOB with MAX A and use of chuck pad by OT.     Balance Overall balance assessment: Needs assistance Sitting-balance support: Feet supported, Single extremity supported Sitting balance-Leahy Scale: Fair Sitting balance - Comments: Pt requiring cues at times due to her trying to lay down too soon; appears to be volitional rather than a balance deficit                                   ADL either performed or assessed with clinical judgement   ADL                                         General ADL Comments: Pt able to eat some brownie while seated at EOB, although did try to lay down while eating; needing max encouragement/cues and physical assist to remain upright as not to aspirate on food.    Extremity/Trunk Assessment Upper Extremity Assessment Upper Extremity Assessment: Generalized weakness   Lower Extremity Assessment Lower Extremity Assessment: Generalized weakness        Vision       Perception     Praxis      Cognition Arousal/Alertness: Awake/alert Behavior During Therapy:  (crying, self-limiting, fearing that her family is mad at her) Overall Cognitive Status: Impaired/Different from baseline  General Comments: Emotional today; appeared to be more emotional when family arrived in room halfway through session; that's when she began crying and saying "they're going to be mad if I don't do it" although family at bedside was very encouraging and kind.        Exercises      Shoulder Instructions       General Comments Pt pleasant towards OT throughout session, stating "you're so kind", and "you're so pretty"; needing lots of cues and support to participate. Only able to sit EOB for approx 4 minutes.    Pertinent Vitals/ Pain       Pain Assessment Pain Assessment: PAINAD Breathing: normal Negative  Vocalization: occasional moan/groan, low speech, negative/disapproving quality Facial Expression: sad, frightened, frown Body Language: relaxed Consolability: distracted or reassured by voice/touch PAINAD Score: 3  Home Living                                          Prior Functioning/Environment              Frequency  Min 1X/week        Progress Toward Goals  OT Goals(current goals can now be found in the care plan section)  Progress towards OT goals: Progressing toward goals  Acute Rehab OT Goals Patient Stated Goal: pain to stop OT Goal Formulation: Patient unable to participate in goal setting Time For Goal Achievement: 10/31/22 Potential to Achieve Goals: Fair ADL Goals Pt Will Perform Grooming: with supervision;bed level;with set-up Pt Will Perform Lower Body Dressing: with mod assist;bed level Pt Will Transfer to Toilet: with min assist  Plan Discharge plan remains appropriate;Frequency remains appropriate    Co-evaluation                 AM-PAC OT "6 Clicks" Daily Activity     Outcome Measure   Help from another person eating meals?: None Help from another person taking care of personal grooming?: A Lot Help from another person toileting, which includes using toliet, bedpan, or urinal?: A Lot Help from another person bathing (including washing, rinsing, drying)?: A Lot Help from another person to put on and taking off regular upper body clothing?: A Little Help from another person to put on and taking off regular lower body clothing?: A Lot 6 Click Score: 15    End of Session Equipment Utilized During Treatment: Other (comment) (heavy use of chuck pads for bed mobility)  OT Visit Diagnosis: Other abnormalities of gait and mobility (R26.89);Muscle weakness (generalized) (M62.81)   Activity Tolerance No increased pain;Other (comment) (limited by cognition)   Patient Left in bed;with bed alarm set;with family/visitor present  (two NT's in room)   Nurse Communication Mobility status        Time: 1353-1406 OT Time Calculation (min): 13 min  Charges: OT General Charges $OT Visit: 1 Visit OT Treatments $Self Care/Home Management : 8-22 mins  Waymon Amato, MS, OTR/L   Vania Rea 10/27/2022, 2:19 PM

## 2022-10-27 NOTE — Progress Notes (Signed)
Date of Admission:  10/12/2022      ID: Cynthia Dean is a 72 y.o. female  Principal Problem:   Back pain Active Problems:   Essential hypertension   Seizure disorder   Diabetic retinopathy without macular edema associated with type 2 diabetes mellitus   GERD (gastroesophageal reflux disease)   Dyslipidemia   Diabetic neuropathy   Anemia of chronic disease   ESRD on dialysis   Bilateral leg pain   Anemia   Insulin-requiring or dependent type II diabetes mellitus   Discitis of lumbar region   Myositis   Psoas abscess Medical history 03/04/22 Ca breast lumpectomy, TAD 8/26-9/8 Hospitalization for fatigue weakness- diagnosed with pulmonary embolism, Staph capitis bacteremia and left breast fluid collection which initially  was thought to be an abscess but on aspiration was old blood and likely a seroma with negative fluid culture She was treated with 2 weeks of appropriate Iv antibiotic for the staph capitis until 04/03/22.  She had been to the ED and admitted many times after that Oct 2023- Back pain and rt sciatica- MRI lumbar spine done on 05/11/22 Degenerative disc disease.  Pt did not want to pursue chemo or radiation for the breast cancer ( ER-, PR 20%, HER 2 positive)  She was hospitalized in Feb 2024 for severe anemia -Hb 5.4- GI work up negative except for diverticulosis ( not bleeding on the day of the scope)  Back again with back pain and b/l radiation of pain down the leg  MRI this time showed b/l lumbar abscess, L4-L5 discitis, b/l psoas abscess  Aspiration of the rt psoas abscess on 10/13/22 is positive for staph capitis   Partner at bed side Subjective: Patient states she is feeling better Back pain is better   Medications:   (feeding supplement) PROSource Plus  30 mL Oral BID BM   acetaminophen  1,000 mg Oral Q8H   atorvastatin  80 mg Oral QHS   Chlorhexidine Gluconate Cloth  6 each Topical Q0600   epoetin (EPOGEN/PROCRIT) injection  10,000  Units Intravenous Q M,W,F-HD   feeding supplement (NEPRO CARB STEADY)  237 mL Oral TID BM   heparin injection (subcutaneous)  5,000 Units Subcutaneous Q8H   levETIRAcetam  1,000 mg Oral Daily   levETIRAcetam  250 mg Oral Q M,W,F   multivitamin  1 tablet Oral QHS   nystatin  5 mL Oral QID   pantoprazole  20 mg Oral BID   QUEtiapine  50 mg Oral QHS   sertraline  25 mg Oral Daily   sodium chloride flush  10-40 mL Intracatheter Q12H   umeclidinium-vilanterol  1 puff Inhalation Daily    Objective: Vital signs in last 24 hours: Patient Vitals for the past 24 hrs:  BP Temp Temp src Pulse Resp SpO2 Weight  10/27/22 0800 (!) 156/76 98.4 F (36.9 C) -- 98 18 96 % --  10/26/22 2026 135/65 98.4 F (36.9 C) Oral 95 -- 100 % --  10/26/22 1700 (!) 140/60 98 F (36.7 C) Oral 88 18 100 % --  10/26/22 1635 -- -- -- 88 14 98 % --  10/26/22 1630 (!) 140/65 -- -- 89 15 98 % --  10/26/22 1627 -- 98.2 F (36.8 C) Oral 88 15 97 % 93.4 kg  10/26/22 1625 -- -- -- 88 17 98 % --  10/26/22 1620 (!) 148/75 -- -- 96 14 100 % --  10/26/22 1615 114/71 -- -- 90 16 96 % --  10/26/22 1610 -- -- --  89 (!) 6 100 % --  10/26/22 1605 -- -- -- 89 15 100 % --  10/26/22 1600 139/77 -- -- 87 15 99 % --  10/26/22 1555 -- -- -- 88 11 94 % --  10/26/22 1550 -- -- -- 90 16 100 % --  10/26/22 1545 139/80 -- -- 89 12 96 % --  10/26/22 1540 -- -- -- 89 20 100 % --  10/26/22 1535 -- -- -- 88 13 99 % --  10/26/22 1530 131/63 -- -- 88 15 100 % --  10/26/22 1525 -- -- -- 87 16 95 % --  10/26/22 1520 -- -- -- 85 13 96 % --  10/26/22 1515 124/70 -- -- 87 12 99 % --  10/26/22 1510 -- -- -- 87 12 97 % --  10/26/22 1505 -- -- -- 86 14 98 % --  10/26/22 1500 (!) 140/71 -- -- 87 17 98 % --  10/26/22 1455 -- -- -- 87 12 99 % --  10/26/22 1450 -- -- -- 87 14 97 % --  10/26/22 1445 (!) 141/77 -- -- 89 17 100 % --  10/26/22 1440 -- -- -- 87 13 96 % --  10/26/22 1435 -- -- -- 88 15 95 % --  10/26/22 1430 133/73 -- -- 91 14 95 % --   10/26/22 1425 -- -- -- 91 13 98 % --  10/26/22 1420 -- -- -- 89 12 97 % --  10/26/22 1415 128/83 -- -- 88 16 98 % --  10/26/22 1410 -- -- -- 74 18 96 % --  10/26/22 1405 -- -- -- 91 20 99 % --  10/26/22 1400 (!) 143/75 -- -- 86 13 96 % --  10/26/22 1355 -- -- -- 88 18 99 % --  10/26/22 1350 -- -- -- 89 18 100 % --  10/26/22 1345 136/79 -- -- 87 13 99 % --  10/26/22 1340 -- -- -- 85 13 97 % --  10/26/22 1335 -- -- -- 85 12 98 % --  10/26/22 1330 (!) 157/76 -- -- 84 13 96 % --  10/26/22 1325 -- -- -- 85 14 97 % --  10/26/22 1320 -- -- -- 85 12 96 % --  10/26/22 1315 (!) 141/79 -- -- 85 15 99 % --       PHYSICAL EXAM:  General: awake, calm Some expressive dysphasia  Lungs: b/l air entry. Heart: s1s2 Abdomen: Soft, Extremities: left AVG site -okay Skin: No rashes or lesions. Or bruising   Lab Results    Latest Ref Rng & Units 10/26/2022    5:28 AM 10/24/2022    4:45 AM 10/22/2022    5:30 AM  CBC  WBC 4.0 - 10.5 K/uL 7.9  8.3  6.7   Hemoglobin 12.0 - 15.0 g/dL 9.3  9.6  9.6   Hematocrit 36.0 - 46.0 % 31.5  32.4  32.5   Platelets 150 - 400 K/uL 178  192  142        Latest Ref Rng & Units 10/26/2022    5:28 AM 10/24/2022    4:45 AM 10/22/2022    5:30 AM  CMP  Glucose 70 - 99 mg/dL 156  172  123   BUN 8 - 23 mg/dL 52  36  19   Creatinine 0.44 - 1.00 mg/dL 10.68  8.27  5.14   Sodium 135 - 145 mmol/L 139  137  137   Potassium 3.5 - 5.1 mmol/L 3.6  3.1  3.4   Chloride 98 - 111 mmol/L 104  100  100   CO2 22 - 32 mmol/L 23  25  27    Calcium 8.9 - 10.3 mg/dL 10.3  10.1  9.9     Erythrocyte Sedimentation Rate     Component Value Date/Time   ESRSEDRATE 70 (H) 10/13/2022 0006      Microbiology: 10/12/22 BC- NG so far 3/21 psoas abscess- staph capitis    Assessment/Plan:  Lumbar discitis,L4-L5  epidural phlegmon and b/l psoas abscess on MRI Underwent IR aspiration of psoas abscess on 10/13/22 Vanco and cefepime stated on 10/13/22 after procedure Gram stain numerous wbc no  organism Culture positive for staph capitis a coag neg bacteria Blood culture ng On 03/19/22 she had 4/4 blood culture positive for staph capitis At that time she had left breast swelling , induration initially thought to be an abscess post lumpectomy on 03/04/22 for malignancy. US done showed complicated cystic lesion measuring 14.5 x 6.3 x 9.2 cm. Left breast. IR aspirated 135 ml of old blood from the left breast and culture was negative She was then treated with a total of 2 weeks of appropriate antibiotic for the pan sensitive staph capitis ( Iv ceftriaxone, vanco and then cefazolin) The 2 staph capitis have slightly different susceptibility pattern- the one in Aug from blood was pan sensitive and the one from the abscess has resistance to clinda and erythromycin  lumbar infection could be an indolent process from Aug bacteremia, vs a new infection because she is at risk  due to being on dialysis and having DDD Or did she have endocarditis and seeded the spine. Last echo from 03/22/22 did not show any valve vegetations. It was repeated on 3/27 and shows no vegetation Blood culture this time negative   On cefazolin Will now need  cefazolin 6 weeks- IV given during dilaysis-until 12/02/22  OPAT order placed   Confusion/agitation- resolved Could be medication- Opioids/cefepime   ESRD on dialysis Recent malfunctioning AVG and underwent fistulogram, angioplasty and stent placement on 09/22/22 She refuses dialysis intermittently     Left breast Carcinoma- s/p  lumpectomy and targeted axillary dissection She did not want to take chemo/radiation    Anemia   DM -    HTN on metoprolol  H/o Pulmonary embolism H/o CVA  Discussed the management with patient and her partner at bed side  Discharge antibiotics: Cefazolin given during dialysis 2 grams- 2 grams - 3 grams For 6 weeks End Date: 12/02/22   Labs weekly while on IV antibiotics: _X_ CBC with differential __ BMP _X_ CMP _X_  CRP _X_ ESR     Fax weekly lab results  promptly to [phone]   Clinic Follow Up Appt: 12/01/22 at 10.30Am     Call [phone] with any questions or concerns  ID will sign off- call if needed

## 2022-10-28 DIAGNOSIS — D638 Anemia in other chronic diseases classified elsewhere: Secondary | ICD-10-CM | POA: Diagnosis not present

## 2022-10-28 DIAGNOSIS — M4646 Discitis, unspecified, lumbar region: Secondary | ICD-10-CM | POA: Diagnosis not present

## 2022-10-28 DIAGNOSIS — K6812 Psoas muscle abscess: Secondary | ICD-10-CM | POA: Diagnosis not present

## 2022-10-28 DIAGNOSIS — N186 End stage renal disease: Secondary | ICD-10-CM | POA: Diagnosis not present

## 2022-10-28 LAB — CBC
HCT: 32.9 % — ABNORMAL LOW (ref 36.0–46.0)
Hemoglobin: 10 g/dL — ABNORMAL LOW (ref 12.0–15.0)
MCH: 24.2 pg — ABNORMAL LOW (ref 26.0–34.0)
MCHC: 30.4 g/dL (ref 30.0–36.0)
MCV: 79.7 fL — ABNORMAL LOW (ref 80.0–100.0)
Platelets: 186 10*3/uL (ref 150–400)
RBC: 4.13 MIL/uL (ref 3.87–5.11)
RDW: 21.2 % — ABNORMAL HIGH (ref 11.5–15.5)
WBC: 12.3 10*3/uL — ABNORMAL HIGH (ref 4.0–10.5)
nRBC: 0 % (ref 0.0–0.2)

## 2022-10-28 LAB — MAGNESIUM: Magnesium: 1.9 mg/dL (ref 1.7–2.4)

## 2022-10-28 LAB — C-REACTIVE PROTEIN: CRP: 2.3 mg/dL — ABNORMAL HIGH (ref <1.0)

## 2022-10-28 LAB — BASIC METABOLIC PANEL
Anion gap: 12 (ref 5–15)
BUN: 41 mg/dL — ABNORMAL HIGH (ref 8–23)
CO2: 26 mmol/L (ref 22–32)
Calcium: 10.7 mg/dL — ABNORMAL HIGH (ref 8.9–10.3)
Chloride: 101 mmol/L (ref 98–111)
Creatinine, Ser: 8.55 mg/dL — ABNORMAL HIGH (ref 0.44–1.00)
GFR, Estimated: 5 mL/min — ABNORMAL LOW (ref >60)
Glucose, Bld: 188 mg/dL — ABNORMAL HIGH (ref 70–99)
Potassium: 3.4 mmol/L — ABNORMAL LOW (ref 3.5–5.1)
Sodium: 139 mmol/L (ref 135–145)

## 2022-10-28 LAB — SEDIMENTATION RATE: Sed Rate: 57 mm/hr — ABNORMAL HIGH (ref 0–30)

## 2022-10-28 LAB — GLUCOSE, CAPILLARY: Glucose-Capillary: 174 mg/dL — ABNORMAL HIGH (ref 70–99)

## 2022-10-28 MED ORDER — CEFAZOLIN SODIUM-DEXTROSE 2-4 GM/100ML-% IV SOLN: 2.0000 g | INTRAVENOUS | Status: DC

## 2022-10-28 MED ORDER — EPOETIN ALFA 10000 UNIT/ML IJ SOLN
INTRAMUSCULAR | Status: AC
  Filled 2022-10-28: qty 1

## 2022-10-28 MED ORDER — CEFAZOLIN SODIUM-DEXTROSE 2-4 GM/100ML-% IV SOLN
2.0000 g | INTRAVENOUS | Status: DC
  Administered 2022-10-31: 2 g via INTRAVENOUS
  Filled 2022-10-28: qty 100

## 2022-10-28 MED ORDER — CINACALCET HCL 30 MG PO TABS
30.0000 mg | ORAL_TABLET | Freq: Every day | ORAL | Status: DC
  Administered 2022-10-28 – 2022-12-15 (×48): 30 mg via ORAL
  Filled 2022-10-28 (×50): qty 1

## 2022-10-28 MED ORDER — CEFAZOLIN IN SODIUM CHLORIDE 3-0.9 GM/100ML-% IV SOLN
3.0000 g | INTRAVENOUS | Status: DC
  Administered 2022-10-28: 3 g via INTRAVENOUS
  Filled 2022-10-28: qty 100

## 2022-10-28 NOTE — Progress Notes (Signed)
Mobility Specialist - Progress Note    10/28/22 1608  Mobility  Activity Transferred from bed to chair;Repositioned in chair  Level of Assistance +2 (takes two people)  Programmer, systems / Overhead Lift  Activity Response Tolerated fair  Mobility Referral Yes  $Mobility charge 1 Mobility   Pt resting in bed on RA upon entry. Pt transferred to recliner via hoyer lift for dialysis transport. +2 assist. Pt given pain peds due to pain. Pt left with transport team with needs in reach.   Johnathan Hausen Mobility Specialist 10/28/22, 4:29 PM

## 2022-10-28 NOTE — Progress Notes (Signed)
[facility]  ROUNDING NOTE   Subjective:   Cynthia Dean is a 72 y.o. 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 She presents to the ED with worsening lower extremity pain. Patient will be admitted for Back pain [M54.9] Bilateral leg pain [M79.604, M79.605]  Given lorazepam 1mg  at 1258 yesterday afternoon.   Transported down to dialysis unit in recliner.   Seen and examined on hemodialysis treatment.     HEMODIALYSIS FLOWSHEET:  Blood Flow Rate (mL/min): 400 mL/min Arterial Pressure (mmHg): -220 mmHg Venous Pressure (mmHg): 310 mmHg TMP (mmHg): 21 mmHg Ultrafiltration Rate (mL/min): 841 mL/min Dialysate Flow Rate (mL/min): 300 ml/min Dialysis Fluid Bolus: Normal Saline Bolus Amount (mL): 100 mL (uf still off)   Objective:  Vital signs in last 24 hours:  Temp:  [97.8 F (36.6 C)-98.1 F (36.7 C)] 97.8 F (36.6 C) (04/05 0804) Pulse Rate:  [82-89] 87 (04/05 0928) Resp:  [16-18] 18 (04/05 0804) BP: (141-159)/(68-78) 159/77 (04/05 0928) SpO2:  [93 %-100 %] 99 % (04/05 0804)  Weight change:  Filed Weights   10/21/22 1223 10/26/22 1151 10/26/22 1627  Weight: 95.4 kg 94.6 kg 93.4 kg    Intake/Output: I/O last 3 completed shifts: In: 120 [P.O.:120] Out: -    Intake/Output this shift:  Total I/O In: 205 [IV Piggyback:205] Out: -   Physical Exam: General: NAD, laying in recliner  Head: Normocephalic, atraumatic.  Moist oral mucosal membranes  Eyes: Anicteric  Lungs:  Clear to auscultation, normal effort  Heart: Regular rate and rhythm  Abdomen:  Soft, nontender  Extremities: no peripheral edema.  Neurologic: Alert and oriented to self  Skin: No lesions  Access: Left AVG    Basic Metabolic Panel: Recent Labs  Lab 10/22/22 0530 10/24/22 0445 10/26/22 0528 10/28/22 0546  NA 137 137 139 139  K 3.4* 3.1*  3.6 3.4*  CL 100 100 104 101  CO2 27 25 23 26   GLUCOSE 123* 172* 156* 188*  BUN 19 36* 52* 41*  CREATININE 5.14* 8.27* 10.68* 8.55*  CALCIUM 9.9 10.1 10.3 10.7*  MG 1.9 2.0 2.2 1.9  PHOS  --  4.2  --   --      Liver Function Tests: Recent Labs  Lab 10/24/22 0445  ALBUMIN 2.4*    No results for input(s): "LIPASE", "AMYLASE" in the last 168 hours. No results for input(s): "AMMONIA" in the last 168 hours.  CBC: Recent Labs  Lab 10/22/22 0530 10/24/22 0445 10/26/22 0528 10/28/22 0546  WBC 6.7 8.3 7.9 12.3*  HGB 9.6* 9.6* 9.3* 10.0*  HCT 32.5* 32.4* 31.5* 32.9*  MCV 80.4 81.0 81.4 79.7*  PLT 142* 192 178 186     Cardiac Enzymes: No results for input(s): "CKTOTAL", "CKMB", "CKMBINDEX", "TROPONINI" in the last 168 hours.  BNP: Invalid input(s): "POCBNP"  CBG: Recent Labs  Lab 10/23/22 1740 10/24/22 1651 10/24/22 1947 10/26/22 2030 10/27/22 0758  GLUCAP 161* 148* 130* 132* 123*     Microbiology: Results for orders placed or performed during the hospital encounter of 10/12/22  Blood culture (routine x 2)     Status: None   Collection Time: 10/12/22  5:40 PM   Specimen: BLOOD  Result Value Ref Range Status   Specimen Description BLOOD BLOOD RIGHT HAND [facility]  Final   Special Requests   Final    BOTTLES DRAWN AEROBIC AND ANAEROBIC Blood Culture results may not be optimal  due to an inadequate volume of blood received in culture bottles   Culture   Final    NO GROWTH 5 DAYS Performed at [facility], [address] [place], [place] [address]    Report Status 10/17/2022 FINAL  Final  Blood culture (routine x 2)     Status: None   Collection Time: 10/12/22  6:56 PM   Specimen: BLOOD  Result Value Ref Range Status   Specimen Description BLOOD BLOOD RIGHT FOREARM  Final   Special Requests   Final    BOTTLES DRAWN AEROBIC ONLY Blood Culture results may not be optimal due to an inadequate volume of blood received in culture bottles   Culture   Final     NO GROWTH 5 DAYS Performed at [facility], [address] [place], [place] [address]    Report Status 10/17/2022 FINAL  Final  Aerobic/Anaerobic Culture w Gram Stain (surgical/deep wound)     Status: None   Collection Time: 10/13/22 11:44 AM   Specimen: Abscess  Result Value Ref Range Status   Specimen Description   Final    ABSCESS Performed at [facility], [address], [place], [place] [address]    Special Requests   Final    MUSCLE ABSCESS Performed at [facility], [address] [place], [place] [address]    Gram Stain   Final    ABUNDANT WBC PRESENT, PREDOMINANTLY PMN NO ORGANISMS SEEN    Culture   Final    FEW STAPHYLOCOCCUS CAPITIS NO ANAEROBES ISOLATED Performed at [facility], [address] [place], [place] [address]    Report Status 10/18/2022 FINAL  Final   Organism ID, Bacteria STAPHYLOCOCCUS CAPITIS  Final      Susceptibility   Staphylococcus capitis - MIC*    CIPROFLOXACIN <=0.5 SENSITIVE Sensitive     ERYTHROMYCIN >=8 RESISTANT Resistant     GENTAMICIN <=0.5 SENSITIVE Sensitive     OXACILLIN <=0.25 SENSITIVE Sensitive     TETRACYCLINE <=1 SENSITIVE Sensitive     VANCOMYCIN 1 SENSITIVE Sensitive     TRIMETH/SULFA <=10 SENSITIVE Sensitive     CLINDAMYCIN INTERMEDIATE Intermediate     RIFAMPIN <=0.5 SENSITIVE Sensitive     Inducible Clindamycin NEGATIVE Sensitive     * FEW STAPHYLOCOCCUS CAPITIS    Coagulation Studies: No results for input(s): "LABPROT", "INR" in the last 72 hours.  Urinalysis: No results for input(s): "COLORURINE", "LABSPEC", "PHURINE", "GLUCOSEU", "HGBUR", "BILIRUBINUR", "KETONESUR", "PROTEINUR", "UROBILINOGEN", "NITRITE", "LEUKOCYTESUR" in the last 72 hours.  Invalid input(s): "APPERANCEUR"    Imaging: No results found.   Medications:    anticoagulant sodium citrate     [START ON 10/31/2022]  ceFAZolin (ANCEF) IV     [START ON 11/02/2022]  ceFAZolin (ANCEF) IV      ceFAZolin  (ANCEF) IV     methocarbamol (ROBAXIN) IV 500 mg (10/25/22 2043)    (feeding supplement) PROSource Plus  30 mL Oral BID BM   acetaminophen  1,000 mg Oral Q8H   atorvastatin  80 mg Oral QHS   Chlorhexidine Gluconate Cloth  6 each Topical Q0600   cinacalcet  30 mg Oral Q supper   epoetin (EPOGEN/PROCRIT) injection  10,000 Units Intravenous Q M,W,F-HD   feeding supplement (NEPRO CARB STEADY)  237 mL Oral TID BM   gabapentin  100 mg Oral Daily   heparin injection (subcutaneous)  5,000 Units Subcutaneous Q8H   levETIRAcetam  1,000 mg Oral Daily   levETIRAcetam  250 mg Oral  Q M,W,F   metoprolol succinate  100 mg Oral Daily   multivitamin  1 tablet Oral QHS   nystatin  5 mL Oral QID   pantoprazole  20 mg Oral BID   QUEtiapine  50 mg Oral QHS   sertraline  25 mg Oral Daily   sodium chloride flush  10-40 mL Intracatheter Q12H   umeclidinium-vilanterol  1 puff Inhalation Daily   alteplase, anticoagulant sodium citrate, heparin, hydrALAZINE, HYDROmorphone, lidocaine (PF), lidocaine-prilocaine, LORazepam, methocarbamol (ROBAXIN) IV, pentafluoroprop-tetrafluoroeth, senna-docusate, sodium chloride flush  Assessment/ Plan:  Ms. Cara Ossa is a 72 y.o.  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 Cultures positive for staph capitis  [facility] MWF Left AVG 102.5kg  End-stage renal disease on hemodialysis. Resumed on MWF schedule. Seen and examined on hemodialysis treatment.    2. Anemia of chronic kidney disease Normocytic Lab Results  Component Value Date   HGB 10.0 (L) 10/28/2022  Patient receives Mircera at outpatient clinic.  Continue EPO with dialysis  treatments.   3. Secondary Hyperparathyroidism: with outpatient labs: PTH 180 (3/11), phosphorus 5.4, calcium 9.3  Today with hypercalcemia.   Lab Results  Component Value Date   PTH 131 (H) 03/14/2018   CALCIUM 10.7 (H) 10/28/2022   CAION 1.09 (L) 08/28/2022   PHOS 4.2 10/24/2022   - Cinacalcet started today.  - Avoid calcium or vitamin D agents.   4.  Hypotension on hemodialysis treatment. Holding home regimen of losartan, metoprolol and torsemide.   5. Diabetes mellitus type II with chronic kidney disease/renal manifestations: insulin dependent. Continue glucose control.   6. Vertebral osteomyelitis, seen on CT lumber spine. Neurosurgery feels patient is not a surgical candidate CT guided aspiration of psoas abscess on 10/13/22. Cultures positive for Staph Capitis.   - Continue Cefazolin with hemodialysis for 8 weeks. Appreciate Infectious Disease input.    LOS: 16 Kamaiya Antilla 4/5/20244:07 PM

## 2022-10-28 NOTE — Progress Notes (Signed)
PROGRESS NOTE    Cynthia Dean  HMC:[id number] DOB: Aug 23, 1950 DOA: 10/12/2022 PCP: Loistine Chance, MD  [id number]  LOS: 16 days   Brief hospital course:   Assessment & Plan: Cynthia Dean is a 72 y/o F w/ PMH of ESRD on HD, COPD, DM2, HTN, depression, CVA who presented w/ back pain x 3 weeks.   Pt presented w/ back pain and was found to intradiscal abscess/lumbar discitis/b/l psoas abscess on MRI.   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  Wound cx growing staph capitis.  ID consulted.  Abx de-escalated to cefazolin.  Not a good surgical candidate as per neuro surg.   --cont cefazolin, for 6 weeks, End Date: 12/02/22   Confusion and agitation Hospital delirium --Pt was agitated, and also not responding to questions or commands during early hospitalization.  Mental status did start to improve, and now pt is calm and interactive, however, still seems confused and not totally coherent.  Pt reportedly was confused at baseline in recent months, and daughter was her Management consultant.  Pt did not appear to have decision-making capacity. --cont seroquel 50 mg nightly (new) to help with delirium and sleep.  ESRD: on HD MWF.  Pt refused HD on 10/17/22, 10/18/22 and 3/27, 4/1. --palliative consulted, currently family requests full scope of treatment.   --Pt agreeable and underwent dialysis on 4/3 --Palliative Care is following -- see their detailed notes related to capacity --Pt HAS capacity to make her decisions.     She has does however have waxing/waning changes in mentation and orientation likely related to hospital delirium and metabolic derangements of renal failure.    Generalized weakness:  PT recs SNF    ACD:  likely secondary ESRD. H&H are stable    Hypokalemia:  --monitor and replete PRN with oral  Peripheral neuropathy --   4/4 - Cautious trial of gabapentin 100 mg daily (ESRD - monitor closely for side effects) 4% - Pt notes significant improvement and no side effects so far --Continue low dose gabapentin and monitor   Hx of seizures:  continue on home dose of keppra   HLD:  continue on statin    Hx of COPD:  w/o exacerbation.  Continue on bronchodilators    DM2: well controlled HbA1c 5.2.  --d/c'ed BG checks    HTN:  --hold losartan and Toprol 2/2 hypotension   GERD:  continue on PPI    Depression: severity unknown.  Continue on home dose of sertraline     Overweight: BMI 28.2.  Would benefit from weight loss   Right foot pain --pt reported severity of pain not matching her exam. --oral dilaudid 1 mg q6h PRN ordered by palliative care provider   DVT prophylaxis: Heparin SQ Code Status: Full code  Family Communication: boyfriend updated at bedside by Dr. Fran Lowes yesterday.  None at bedside but will attempt to call this afternoon   Consultants - Nephrology, Infectious Disease, Palliative Care  Level of care: Telemetry Medical  Dispo:   The patient is from: home Anticipated d/c is to: SNF rehab Anticipated d/c date is: undetermined, will need outpatient dialysis chair time   Subjective and Interval History:  Pt was seen resting in bed this AM.  She reports the pain in her feet is much better today, and grateful the medication started yesterday is helping significantly. She denies feeling more fatigued, dizzy  lightheaded or other side effects.  She remains agreeable to dialysis, which should be today. We discussed her needing to sit up in chair for 4 hours in order to go to dialysis outpatient. She is agreeable to attempt and work on it.   Objective: Vitals:   10/27/22 2047 10/28/22 0437 10/28/22 0804 10/28/22 0928  BP: (!) 148/78 (!) 141/68 (!) 157/78 (!) 159/77  Pulse: 89 82 86 87  Resp: 18 16 18    Temp: 98.1 F (36.7 C) 97.9 F (36.6 C) 97.8 F (36.6 C)   TempSrc: Oral      SpO2: 100% 93% 99%   Weight:      Height:        Intake/Output Summary (Last 24 hours) at 10/28/2022 1236 Last data filed at 10/28/2022 1200 Gross per 24 hour  Intake 325 ml  Output --  Net 325 ml   Filed Weights   10/21/22 1223 10/26/22 1151 10/26/22 1627  Weight: 95.4 kg 94.6 kg 93.4 kg    Examination:   General exam: awake, alert, no acute distress HEENT: moist mucus membranes, hearing grossly normal  Respiratory system: on room air, normal respiratory effort. Cardiovascular system: normal S1/S2, RRR, no pedal edema, port noted right upper chest wall Gastrointestinal system: soft, NT, ND  Central nervous system: A&O x3. no gross focal neurologic deficits, normal speech Extremities:  feet non-tender today (improved), LUE fistula noted, no edema, normal tone Skin: dry, intact, normal temperature Psychiatry: normal mood, congruent affect, judgement and insight appear normal    Data Reviewed: I have personally reviewed labs and imaging studies  Time spent: 35 minutes  Pennie BanterKelly A Dayshon Roback, DO [facility] If 7PM-7AM, please contact night-coverage 10/28/2022, 12:36 PM

## 2022-10-28 NOTE — Progress Notes (Signed)
Received patient in recli     Transported By hospital transport back to room in recliner from hemo unit Hand-off given to patient's nurse.    Access used: L AVG Access issues: None   Total UF removed: 1.5 Medication(s) given:Epogen 10,000 units, Cefazolin 3gm Post HD VS: Stable Post HD weight: Unable to weigh patient , in recliner  [facility]

## 2022-10-29 DIAGNOSIS — N186 End stage renal disease: Secondary | ICD-10-CM | POA: Diagnosis not present

## 2022-10-29 DIAGNOSIS — M4646 Discitis, unspecified, lumbar region: Secondary | ICD-10-CM | POA: Diagnosis not present

## 2022-10-29 DIAGNOSIS — Z992 Dependence on renal dialysis: Secondary | ICD-10-CM | POA: Diagnosis not present

## 2022-10-29 DIAGNOSIS — K6812 Psoas muscle abscess: Secondary | ICD-10-CM | POA: Diagnosis not present

## 2022-10-29 LAB — BASIC METABOLIC PANEL
Anion gap: 15 (ref 5–15)
BUN: 26 mg/dL — ABNORMAL HIGH (ref 8–23)
CO2: 28 mmol/L (ref 22–32)
Calcium: 9.8 mg/dL (ref 8.9–10.3)
Chloride: 95 mmol/L — ABNORMAL LOW (ref 98–111)
Creatinine, Ser: 5.84 mg/dL — ABNORMAL HIGH (ref 0.44–1.00)
GFR, Estimated: 7 mL/min — ABNORMAL LOW (ref >60)
Glucose, Bld: 154 mg/dL — ABNORMAL HIGH (ref 70–99)
Potassium: 3.1 mmol/L — ABNORMAL LOW (ref 3.5–5.1)
Sodium: 138 mmol/L (ref 135–145)

## 2022-10-29 LAB — CBC
HCT: 33.4 % — ABNORMAL LOW (ref 36.0–46.0)
Hemoglobin: 10.1 g/dL — ABNORMAL LOW (ref 12.0–15.0)
MCH: 24.3 pg — ABNORMAL LOW (ref 26.0–34.0)
MCHC: 30.2 g/dL (ref 30.0–36.0)
MCV: 80.3 fL (ref 80.0–100.0)
Platelets: 202 10*3/uL (ref 150–400)
RBC: 4.16 MIL/uL (ref 3.87–5.11)
RDW: 21.5 % — ABNORMAL HIGH (ref 11.5–15.5)
WBC: 11.8 10*3/uL — ABNORMAL HIGH (ref 4.0–10.5)
nRBC: 0 % (ref 0.0–0.2)

## 2022-10-29 MED ORDER — PSYLLIUM 95 % PO PACK
1.0000 | PACK | Freq: Every day | ORAL | Status: DC
  Administered 2022-10-30 – 2023-01-05 (×59): 1 via ORAL
  Filled 2022-10-29 (×68): qty 1

## 2022-10-29 MED ORDER — LOPERAMIDE HCL 2 MG PO CAPS
2.0000 mg | ORAL_CAPSULE | Freq: Once | ORAL | Status: AC
  Administered 2022-10-29: 2 mg via ORAL
  Filled 2022-10-29: qty 1

## 2022-10-29 MED ORDER — POTASSIUM CHLORIDE CRYS ER 20 MEQ PO TBCR
40.0000 meq | EXTENDED_RELEASE_TABLET | ORAL | Status: AC
  Administered 2022-10-29 (×2): 40 meq via ORAL
  Filled 2022-10-29 (×2): qty 2

## 2022-10-29 MED ORDER — PSYLLIUM 95 % PO PACK: 1.0000 | PACK | Freq: Every day | ORAL | Status: DC

## 2022-10-29 MED ORDER — GERHARDT'S BUTT CREAM
TOPICAL_CREAM | Freq: Four times a day (QID) | CUTANEOUS | Status: DC
  Administered 2022-11-13 – 2022-12-04 (×4): 1 via TOPICAL
  Filled 2022-10-29 (×4): qty 1

## 2022-10-29 NOTE — Progress Notes (Signed)
[facility]  PROGRESS NOTE   Subjective:   Had stable dialysis yesterday.  Still has severe back pain.  Objective:  Vital signs: Blood pressure (!) 129/57, pulse 79, temperature 97.9 F (36.6 C), resp. rate 17, height 6' (1.829 m), weight 93.4 kg, SpO2 97 %.  Intake/Output Summary (Last 24 hours) at 10/29/2022 1439 Last data filed at 10/28/2022 2050 Gross per 24 hour  Intake --  Output 1500 ml  Net -1500 ml   Filed Weights   10/26/22 1151 10/26/22 1627  Weight: 94.6 kg 93.4 kg     Physical Exam: General:  No acute distress  Head:  Normocephalic, atraumatic. Moist oral mucosal membranes  Eyes:  Anicteric  Neck:  Supple  Lungs:   Clear to auscultation, normal effort  Heart:  S1S2 no rubs  Abdomen:   Soft, nontender, bowel sounds present  Extremities:  peripheral edema.  Neurologic:  Awake, alert, following commands  Skin:  No lesions  Access:     Basic Metabolic Panel: Recent Labs  Lab 10/24/22 0445 10/26/22 0528 10/28/22 0546 10/29/22 0814  NA 137 139 139 138  K 3.1* 3.6 3.4* 3.1*  CL 100 104 101 95*  CO2 25 23 26 28   GLUCOSE 172* 156* 188* 154*  BUN 36* 52* 41* 26*  CREATININE 8.27* 10.68* 8.55* 5.84*  CALCIUM 10.1 10.3 10.7* 9.8  MG 2.0 2.2 1.9  --   PHOS 4.2  --   --   --    GFR: Estimated Creatinine Clearance: 11.3 mL/min (A) (by C-G formula based on SCr of 5.84 mg/dL (H)).  Liver Function Tests: Recent Labs  Lab 10/24/22 0445  ALBUMIN 2.4*   No results for input(s): "LIPASE", "AMYLASE" in the last 168 hours. No results for input(s): "AMMONIA" in the last 168 hours.  CBC: Recent Labs  Lab 10/24/22 0445 10/26/22 0528 10/28/22 0546 10/29/22 0814  WBC 8.3 7.9 12.3* 11.8*  HGB 9.6* 9.3* 10.0* 10.1*  HCT 32.4* 31.5* 32.9* 33.4*  MCV 81.0 81.4 79.7* 80.3  PLT 192 178 186 202     HbA1C: Hemoglobin A1C  Date/Time Value Ref Range Status  06/15/2014 12:58 AM 7.9 (H) 4.2 - 6.3 % Final    Comment:    The American Diabetes  Association recommends that a primary goal of therapy should be <7% and that physicians should reevaluate the treatment regimen in patients with HbA1c values consistently >8%.   12/24/2013 11:18 AM 8.2 (H) 4.2 - 6.3 % Final    Comment:    The American Diabetes Association recommends that a primary goal of therapy should be <7% and that physicians should reevaluate the treatment regimen in patients with HbA1c values consistently >8%.    Hgb A1c MFr Bld  Date/Time Value Ref Range Status  10/13/2022 11:46 AM 5.2 4.8 - 5.6 % Final    Comment:    (NOTE)         Prediabetes: 5.7 - 6.4         Diabetes: >6.4         Glycemic control for adults with diabetes: <7.0   03/23/2022 06:47 AM 5.8 (H) 4.8 - 5.6 % Final    Comment:    (NOTE) Pre diabetes:          5.7%-6.4%  Diabetes:              >6.4%  Glycemic control for   <7.0% adults with diabetes     Urinalysis: No results for input(s): "COLORURINE", "LABSPEC", "PHURINE", "  GLUCOSEU", "HGBUR", "BILIRUBINUR", "KETONESUR", "PROTEINUR", "UROBILINOGEN", "NITRITE", "LEUKOCYTESUR" in the last 72 hours.  Invalid input(s): "APPERANCEUR"    Imaging: No results found.   Medications:    anticoagulant sodium citrate     [START ON 10/31/2022]  ceFAZolin (ANCEF) IV     [START ON 11/02/2022]  ceFAZolin (ANCEF) IV      ceFAZolin (ANCEF) IV Stopped (10/28/22 2248)   methocarbamol (ROBAXIN) IV 500 mg (10/25/22 2043)    (feeding supplement) PROSource Plus  30 mL Oral BID BM   acetaminophen  1,000 mg Oral Q8H   atorvastatin  80 mg Oral QHS   Chlorhexidine Gluconate Cloth  6 each Topical Q0600   cinacalcet  30 mg Oral Q supper   epoetin (EPOGEN/PROCRIT) injection  10,000 Units Intravenous Q M,W,F-HD   feeding supplement (NEPRO CARB STEADY)  237 mL Oral TID BM   gabapentin  100 mg Oral Daily   heparin injection (subcutaneous)  5,000 Units Subcutaneous Q8H   levETIRAcetam  1,000 mg Oral Daily   levETIRAcetam  250 mg Oral Q M,W,F   metoprolol  succinate  100 mg Oral Daily   multivitamin  1 tablet Oral QHS   nystatin  5 mL Oral QID   pantoprazole  20 mg Oral BID   potassium chloride  40 mEq Oral Q4H   QUEtiapine  50 mg Oral QHS   sertraline  25 mg Oral Daily   sodium chloride flush  10-40 mL Intracatheter Q12H   umeclidinium-vilanterol  1 puff Inhalation Daily    Assessment/ Plan:     72 y.o.  female with end stage renal disease on hemodialysis, coronary artery disease, COPD with intermittent home O2, hypertension, hyperlipidemia, stroke, diabetes mellitus type II, and breast cancer who was admitted to [facility] on 10/12/2022 for Back pain [M54.9] Bilateral leg pain [M79.604, M79.605]   Patient was found to have vertebral osteomyelitis and psoas abscess. Cultures positive for staph capitis  #1: Sepsis/vertebral osteomyelitis: Patient is presently on cefazolin.  #2: End-stage renal disease: Had stable dialysis yesterday.  #3: Anemia: Anemia secondary to chronic kidney disease.  Will continue anemia protocols.  #4: Secondary parathyroidism: We will continue Cinacalcet as ordered.  Will follow closely.      LOS: 17 Lorain Childes, MD [facility] 4/6/20242:39 PM

## 2022-10-29 NOTE — Progress Notes (Addendum)
PROGRESS NOTE    Cynthia Dean  BWG:[id number] DOB: [birth year] DOA: 10/12/2022 PCP: Loistine Chance, MD  [id number]  LOS: 17 days   Brief hospital course:   Assessment & Plan: Cynthia Dean is a 72 y/o F w/ PMH of ESRD on HD, COPD, DM2, HTN, depression, CVA who presented w/ back pain x 3 weeks.   Pt presented w/ back pain and was found to intradiscal abscess/lumbar discitis/b/l psoas abscess on MRI.   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  Wound cx growing staph capitis.  ID consulted.  Abx de-escalated to cefazolin.  Not a good surgical candidate as per neuro surg.   --cont cefazolin, for 6 weeks, End Date: 12/02/22   Confusion and agitation Hospital delirium --Pt was agitated, and also not responding to questions or commands during early hospitalization.  Mental status did start to improve, and now pt is calm and interactive, however, still seems confused and not totally coherent.  Pt reportedly was confused at baseline in recent months, and daughter was her Management consultant.  Pt did not appear to have decision-making capacity. --cont seroquel 50 mg nightly (new) to help with delirium and sleep.  ESRD: on HD MWF.  Pt refused HD on 10/17/22, 10/18/22 and 3/27, 4/1. --palliative consulted, currently family requests full scope of treatment.   --Pt agreeable and underwent dialysis on 4/3 --Palliative Care is following -- see their detailed notes related to capacity --Pt HAS capacity to make her decisions.     She has does however have waxing/waning changes in mentation and orientation likely related to hospital delirium and metabolic derangements of renal failure.    Generalized weakness:  PT recs SNF    ACD:  likely secondary ESRD. H&H are stable    Hypokalemia:  4/6: K 3.1 - replacing orally --monitor and replete PRN    Peripheral neuropathy --  4/4 - Cautious trial of gabapentin 100 mg daily (ESRD - monitor closely for side effects) 4% - Pt notes significant improvement and no side effects so far --Continue low dose gabapentin and monitor   Hx of seizures:  continue on home dose of keppra   HLD:  continue on statin    Hx of COPD:  w/o exacerbation.  Continue on bronchodilators    DM2: well controlled HbA1c 5.2.  --d/c'ed BG checks    HTN:  --hold losartan and Toprol 2/2 hypotension   GERD:  continue on PPI    Depression: severity unknown.  Continue on home dose of sertraline     Overweight: BMI 28.2.  Would benefit from weight loss   Right foot pain --pt reported severity of pain not matching her exam. --oral dilaudid 1 mg q6h PRN ordered by palliative care provider   DVT prophylaxis: Heparin SQ Code Status: Full code  Family Communication: none at bedside, will attempt to call   Consultants - Nephrology, Infectious Disease, Palliative Care  Level of care: Telemetry Medical  Dispo:   The patient is from: home Anticipated d/c is to: SNF rehab Anticipated d/c date is: undetermined, will need outpatient dialysis chair time   Subjective and Interval History:  Pt was seen resting in bed this AM.  Reports her feet continue to feel improved.  She states dialysis went well yesterday.  Remains agreeable to continue.  She reports feeling overall well, has no acute complaints.    Objective:  Vitals:   10/28/22 2050 10/28/22 2203 10/29/22 0405 10/29/22 0717  BP: (!) 113/58 91/61 (!) 103/48 (!) 129/57  Pulse: 81 79 81 79  Resp: 18 16 16 17   Temp: 98.2 F (36.8 C) (!) 97.5 F (36.4 C) 97.7 F (36.5 C) 97.9 F (36.6 C)  TempSrc: Oral     SpO2: 100% 99% 95% 97%  Weight:      Height:        Intake/Output Summary (Last 24 hours) at 10/29/2022 1209 Last data filed at 10/28/2022 2050 Gross per 24 hour  Intake --  Output 1500 ml  Net -1500 ml   Filed Weights   10/26/22 1151  10/26/22 1627  Weight: 94.6 kg 93.4 kg    Examination:   General exam: awake, alert, no acute distress HEENT: moist mucus membranes, hearing grossly normal  Respiratory system: CTAB no wheezes or rhonchi, on room air, normal respiratory effort. Cardiovascular system: normal S1/S2, RRR, no pedal edema, port noted right upper chest wall Gastrointestinal system: soft, NT, ND  Central nervous system: A&O x3. no gross focal neurologic deficits, normal speech Extremities:  LUE fistula noted, no edema, normal tone Skin: dry, intact, normal temperature Psychiatry: normal mood, congruent affect, judgement and insight appear normal    Data Reviewed: I have personally reviewed labs and imaging studies  Time spent: 35 minutes  Pennie Banter, DO [facility] If 7PM-7AM, please contact night-coverage 10/29/2022, 12:09 PM

## 2022-10-30 DIAGNOSIS — Z992 Dependence on renal dialysis: Secondary | ICD-10-CM | POA: Diagnosis not present

## 2022-10-30 DIAGNOSIS — K6812 Psoas muscle abscess: Secondary | ICD-10-CM | POA: Diagnosis not present

## 2022-10-30 DIAGNOSIS — M4646 Discitis, unspecified, lumbar region: Secondary | ICD-10-CM | POA: Diagnosis not present

## 2022-10-30 DIAGNOSIS — N186 End stage renal disease: Secondary | ICD-10-CM | POA: Diagnosis not present

## 2022-10-30 LAB — BASIC METABOLIC PANEL
Anion gap: 12 (ref 5–15)
BUN: 38 mg/dL — ABNORMAL HIGH (ref 8–23)
CO2: 26 mmol/L (ref 22–32)
Calcium: 10 mg/dL (ref 8.9–10.3)
Chloride: 100 mmol/L (ref 98–111)
Creatinine, Ser: 7.37 mg/dL — ABNORMAL HIGH (ref 0.44–1.00)
GFR, Estimated: 5 mL/min — ABNORMAL LOW (ref >60)
Glucose, Bld: 161 mg/dL — ABNORMAL HIGH (ref 70–99)
Potassium: 4 mmol/L (ref 3.5–5.1)
Sodium: 138 mmol/L (ref 135–145)

## 2022-10-30 LAB — CBC
HCT: 33.6 % — ABNORMAL LOW (ref 36.0–46.0)
Hemoglobin: 9.8 g/dL — ABNORMAL LOW (ref 12.0–15.0)
MCH: 23.7 pg — ABNORMAL LOW (ref 26.0–34.0)
MCHC: 29.2 g/dL — ABNORMAL LOW (ref 30.0–36.0)
MCV: 81.4 fL (ref 80.0–100.0)
Platelets: 215 10*3/uL (ref 150–400)
RBC: 4.13 MIL/uL (ref 3.87–5.11)
RDW: 21.8 % — ABNORMAL HIGH (ref 11.5–15.5)
WBC: 10.5 10*3/uL (ref 4.0–10.5)
nRBC: 0 % (ref 0.0–0.2)

## 2022-10-30 NOTE — Progress Notes (Signed)
[facility]  PROGRESS NOTE   Subjective:   Awake and alert.  Feeding herself this morning.  Does not complain of back pain today.  Objective:  Vital signs: Blood pressure 138/70, pulse 71, temperature 97.6 F (36.4 C), resp. rate 18, height 6' (1.829 m), weight 93.4 kg, SpO2 97 %.  Intake/Output Summary (Last 24 hours) at 10/30/2022 1131 Last data filed at 10/29/2022 2125 Gross per 24 hour  Intake 180 ml  Output --  Net 180 ml   Filed Weights   10/26/22 1151 10/26/22 1627  Weight: 94.6 kg 93.4 kg     Physical Exam: General:  No acute distress  Head:  Normocephalic, atraumatic. Moist oral mucosal membranes  Eyes:  Anicteric  Neck:  Supple  Lungs:   Clear to auscultation, normal effort  Heart:  S1S2 no rubs  Abdomen:   Soft, nontender, bowel sounds present  Extremities:  peripheral edema.  Neurologic:  Awake, alert, following commands  Skin:  No lesions  Access:     Basic Metabolic Panel: Recent Labs  Lab 10/24/22 0445 10/26/22 0528 10/28/22 0546 10/29/22 0814 10/30/22 0921  NA 137 139 139 138 138  K 3.1* 3.6 3.4* 3.1* 4.0  CL 100 104 101 95* 100  CO2 25 23 26 28 26   GLUCOSE 172* 156* 188* 154* 161*  BUN 36* 52* 41* 26* 38*  CREATININE 8.27* 10.68* 8.55* 5.84* 7.37*  CALCIUM 10.1 10.3 10.7* 9.8 10.0  MG 2.0 2.2 1.9  --   --   PHOS 4.2  --   --   --   --    GFR: Estimated Creatinine Clearance: 9 mL/min (A) (by C-G formula based on SCr of 7.37 mg/dL (H)).  Liver Function Tests: Recent Labs  Lab 10/24/22 0445  ALBUMIN 2.4*   No results for input(s): "LIPASE", "AMYLASE" in the last 168 hours. No results for input(s): "AMMONIA" in the last 168 hours.  CBC: Recent Labs  Lab 10/24/22 0445 10/26/22 0528 10/28/22 0546 10/29/22 0814 10/30/22 0921  WBC 8.3 7.9 12.3* 11.8* 10.5  HGB 9.6* 9.3* 10.0* 10.1* 9.8*  HCT 32.4* 31.5* 32.9* 33.4* 33.6*  MCV 81.0 81.4 79.7* 80.3 81.4  PLT 192 178 186 202 215     HbA1C: Hemoglobin A1C   Date/Time Value Ref Range Status  06/15/2014 12:58 AM 7.9 (H) 4.2 - 6.3 % Final    Comment:    The American Diabetes Association recommends that a primary goal of therapy should be <7% and that physicians should reevaluate the treatment regimen in patients with HbA1c values consistently >8%.   12/24/2013 11:18 AM 8.2 (H) 4.2 - 6.3 % Final    Comment:    The American Diabetes Association recommends that a primary goal of therapy should be <7% and that physicians should reevaluate the treatment regimen in patients with HbA1c values consistently >8%.    Hgb A1c MFr Bld  Date/Time Value Ref Range Status  10/13/2022 11:46 AM 5.2 4.8 - 5.6 % Final    Comment:    (NOTE)         Prediabetes: 5.7 - 6.4         Diabetes: >6.4         Glycemic control for adults with diabetes: <7.0   03/23/2022 06:47 AM 5.8 (H) 4.8 - 5.6 % Final    Comment:    (NOTE) Pre diabetes:          5.7%-6.4%  Diabetes:              >  6.4%  Glycemic control for   <7.0% adults with diabetes     Urinalysis: No results for input(s): "COLORURINE", "LABSPEC", "PHURINE", "GLUCOSEU", "HGBUR", "BILIRUBINUR", "KETONESUR", "PROTEINUR", "UROBILINOGEN", "NITRITE", "LEUKOCYTESUR" in the last 72 hours.  Invalid input(s): "APPERANCEUR"    Imaging: No results found.   Medications:    anticoagulant sodium citrate     [START ON 10/31/2022]  ceFAZolin (ANCEF) IV     [START ON 11/02/2022]  ceFAZolin (ANCEF) IV      ceFAZolin (ANCEF) IV Stopped (10/28/22 2248)   methocarbamol (ROBAXIN) IV 500 mg (10/25/22 2043)    (feeding supplement) PROSource Plus  30 mL Oral BID BM   acetaminophen  1,000 mg Oral Q8H   atorvastatin  80 mg Oral QHS   Chlorhexidine Gluconate Cloth  6 each Topical Q0600   cinacalcet  30 mg Oral Q supper   epoetin (EPOGEN/PROCRIT) injection  10,000 Units Intravenous Q M,W,F-HD   feeding supplement (NEPRO CARB STEADY)  237 mL Oral TID BM   gabapentin  100 mg Oral Daily   Gerhardt's butt cream   Topical  QID   heparin injection (subcutaneous)  5,000 Units Subcutaneous Q8H   levETIRAcetam  1,000 mg Oral Daily   levETIRAcetam  250 mg Oral Q M,W,F   metoprolol succinate  100 mg Oral Daily   multivitamin  1 tablet Oral QHS   nystatin  5 mL Oral QID   pantoprazole  20 mg Oral BID   psyllium  1 packet Oral Daily   QUEtiapine  50 mg Oral QHS   sertraline  25 mg Oral Daily   sodium chloride flush  10-40 mL Intracatheter Q12H   umeclidinium-vilanterol  1 puff Inhalation Daily    Assessment/ Plan:     72 y.o.  female with end stage renal disease on hemodialysis, coronary artery disease, COPD with intermittent home O2, hypertension, hyperlipidemia, stroke, diabetes mellitus type II, and breast cancer who was admitted to [facility] on 10/12/2022 for Back pain [M54.9] Bilateral leg pain [M79.604, M79.605]   Patient was found to have vertebral osteomyelitis and psoas abscess. Cultures positive for staph capitis   #1: Sepsis/vertebral osteomyelitis: Patient is presently on cefazolin.   #2: End-stage renal disease: Had stable dialysis on Friday.  Will schedule for dialysis tomorrow.     #3: Anemia: Anemia secondary to chronic kidney disease.  Will continue anemia protocols.   #4: Secondary parathyroidism: We will continue Cinacalcet as ordered.   Will follow closely.    LOS: 18 Lorain Childes, MD [facility] 4/7/202411:31 AM

## 2022-10-30 NOTE — TOC Progression Note (Addendum)
  Transition of Care [facility]) - Progression Note    Patient Details  Name: Cynthia Dean MRN: [id number] Date of Birth: 23-Oct-1950  [facility]) CM/SW Contact  Carmina Miller, LCSWA Phone Number: 10/30/2022, 11:02 AM  Clinical Narrative:     Spoke with pt's daughter, provided bed offers, pt's daughter requested time to consider offers. TOC will continue to follow.         Expected Discharge Plan and Services                                               Social Determinants of Health (SDOH) Interventions SDOH Screenings   Food Insecurity: No Food Insecurity (10/12/2022)  Housing: Low Risk  (10/12/2022)  Transportation Needs: No Transportation Needs (10/12/2022)  Utilities: Not At Risk (10/12/2022)  Financial Resource Strain: Low Risk  (11/12/2017)  Physical Activity: Insufficiently Active (11/12/2017)  Social Connections: Moderately Integrated (11/12/2017)  Stress: No Stress Concern Present (11/12/2017)  Tobacco Use: Medium Risk (10/12/2022)    Readmission Risk Interventions     No data to display

## 2022-10-30 NOTE — Progress Notes (Signed)
PROGRESS NOTE    Cynthia Dean  SFK:[id number] DOB: [birth year] DOA: 10/12/2022 PCP: Loistine Chance, MD  123A/123A-[id number]  LOS: 18 days   Brief hospital course:   Assessment & Plan: Cynthia Dean is a 72 y/o F w/ PMH of ESRD on HD, COPD, DM2, HTN, depression, CVA who presented w/ back pain x 3 weeks.   Pt presented w/ back pain and was found to intradiscal abscess/lumbar discitis/b/l psoas abscess on MRI.   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  Wound cx growing staph capitis.  ID consulted.  Abx de-escalated to cefazolin.  Not a good surgical candidate as per neuro surg.   --cont cefazolin, for 6 weeks, End Date: 12/02/22   Confusion and agitation Hospital delirium --Pt was agitated, and also not responding to questions or commands during early hospitalization.  Mental status did start to improve, and now pt is calm and interactive, however, still seems confused and not totally coherent.  Pt reportedly was confused at baseline in recent months, and daughter was her Management consultant.  Pt did not appear to have decision-making capacity. --cont seroquel 50 mg nightly (new) to help with delirium and sleep.  ESRD: on HD MWF.  Pt refused HD on 10/17/22, 10/18/22 and 3/27, 4/1.   Since then, has been agreeable to dialysis without further issues.     --Palliative Care is following -- see their detailed notes related to capacity --Pt HAS capacity to make her decisions.   She has does however have waxing/waning changes in mentation and orientation likely related to hospital delirium and metabolic derangements of renal failure. --Needs to tolerate 4 hours in chair for outpatient dialysis    Generalized weakness:  PT recs SNF. TOC following for placement.    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 H&H are stable    Hypokalemia:  4/6: K 3.1 - replacing  orally --monitor and replete PRN   Peripheral neuropathy --  4/4 - Cautious trial of gabapentin 100 mg daily (ESRD - monitor closely for side effects) 4% - Pt notes significant improvement and no side effects so far --Continue low dose gabapentin and monitor   Hx of seizures:  continue on home dose of keppra   HLD:  continue on statin    Hx of COPD:  w/o exacerbation.  Continue on bronchodilators    DM2: well controlled HbA1c 5.2.  --d/c'ed BG checks    HTN:  --hold losartan and Toprol 2/2 hypotension   GERD:  continue on PPI    Depression: severity unknown.  Continue on home dose of sertraline     Overweight: BMI 28.2.  Would benefit from weight loss   Right foot pain --pt reported severity of pain not matching her exam. --oral dilaudid 1 mg q6h PRN ordered by palliative care provider   DVT prophylaxis: Heparin SQ Code Status: Full code  Family Communication: none at bedside, will attempt to call   Consultants - Nephrology, Infectious Disease, Palliative Care  Level of care: Telemetry Medical  Dispo:   The patient is from: home Anticipated d/c is to: SNF rehab Anticipated d/c date is: undetermined, will need outpatient dialysis chair time   Subjective and Interval History:  Pt was seen resting in bed this AM, RN at bedside with AM meds.  Pt reports her legs are hurting, is due for pain medicine.  She perseverates on this and  not able to express any other complaints.  She reports dialysis going fine.   Objective: Vitals:   10/29/22 1651 10/29/22 2035 10/30/22 0432 10/30/22 0833  BP: (!) 105/52 107/76 (!) 114/58 138/70  Pulse: 85 82 71 71  Resp: 18 19 18 18   Temp: 97.9 F (36.6 C) 98.6 F (37 C) 98 F (36.7 C) 97.6 F (36.4 C)  TempSrc:   Oral   SpO2: 96% 98% 95% 97%  Weight:      Height:        Intake/Output Summary (Last 24 hours) at 10/30/2022 1059 Last data filed at 10/29/2022 2125 Gross per 24 hour  Intake 180 ml  Output --  Net 180 ml    Filed Weights   10/26/22 1151 10/26/22 1627  Weight: 94.6 kg 93.4 kg    Examination:   General exam: awake, alert, no acute distress Respiratory system: on room air, normal respiratory effort. Cardiovascular system: RRR, no pedal edema, port noted right upper chest wall Gastrointestinal system: soft, NT, ND  Central nervous system: A&O x3. no gross focal neurologic deficits Extremities:  LUE fistula noted, no edema, normal tone Skin: dry, intact, normal temperature Psychiatry: depressed mood, congruent affect    Data Reviewed: I have personally reviewed labs and imaging studies  Time spent: 35 minutes  Pennie BanterKelly A Ekansh Sherk, DO [facility] If 7PM-7AM, please contact night-coverage 10/30/2022, 10:59 AM

## 2022-10-31 DIAGNOSIS — N186 End stage renal disease: Secondary | ICD-10-CM | POA: Diagnosis not present

## 2022-10-31 DIAGNOSIS — M4646 Discitis, unspecified, lumbar region: Secondary | ICD-10-CM | POA: Diagnosis not present

## 2022-10-31 DIAGNOSIS — K6812 Psoas muscle abscess: Secondary | ICD-10-CM | POA: Diagnosis not present

## 2022-10-31 DIAGNOSIS — M79604 Pain in right leg: Secondary | ICD-10-CM | POA: Diagnosis not present

## 2022-10-31 LAB — BASIC METABOLIC PANEL
Anion gap: 12 (ref 5–15)
BUN: 45 mg/dL — ABNORMAL HIGH (ref 8–23)
CO2: 24 mmol/L (ref 22–32)
Calcium: 9.5 mg/dL (ref 8.9–10.3)
Chloride: 102 mmol/L (ref 98–111)
Creatinine, Ser: 8.57 mg/dL — ABNORMAL HIGH (ref 0.44–1.00)
GFR, Estimated: 5 mL/min — ABNORMAL LOW (ref >60)
Glucose, Bld: 178 mg/dL — ABNORMAL HIGH (ref 70–99)
Potassium: 3.6 mmol/L (ref 3.5–5.1)
Sodium: 138 mmol/L (ref 135–145)

## 2022-10-31 LAB — GLUCOSE, CAPILLARY
Glucose-Capillary: 149 mg/dL — ABNORMAL HIGH (ref 70–99)
Glucose-Capillary: 154 mg/dL — ABNORMAL HIGH (ref 70–99)

## 2022-10-31 LAB — CBC
HCT: 31.7 % — ABNORMAL LOW (ref 36.0–46.0)
HCT: 33.1 % — ABNORMAL LOW (ref 36.0–46.0)
Hemoglobin: 9.3 g/dL — ABNORMAL LOW (ref 12.0–15.0)
Hemoglobin: 9.8 g/dL — ABNORMAL LOW (ref 12.0–15.0)
MCH: 24 pg — ABNORMAL LOW (ref 26.0–34.0)
MCH: 24.3 pg — ABNORMAL LOW (ref 26.0–34.0)
MCHC: 29.3 g/dL — ABNORMAL LOW (ref 30.0–36.0)
MCHC: 29.6 g/dL — ABNORMAL LOW (ref 30.0–36.0)
MCV: 81.9 fL (ref 80.0–100.0)
MCV: 81.9 fL (ref 80.0–100.0)
Platelets: 177 10*3/uL (ref 150–400)
Platelets: 214 10*3/uL (ref 150–400)
RBC: 3.87 MIL/uL (ref 3.87–5.11)
RBC: 4.04 MIL/uL (ref 3.87–5.11)
RDW: 21.6 % — ABNORMAL HIGH (ref 11.5–15.5)
RDW: 21.6 % — ABNORMAL HIGH (ref 11.5–15.5)
WBC: 9.2 10*3/uL (ref 4.0–10.5)
WBC: 8.7 10*3/uL (ref 4.0–10.5)
nRBC: 0 % (ref 0.0–0.2)
nRBC: 0 % (ref 0.0–0.2)

## 2022-10-31 LAB — MAGNESIUM: Magnesium: 1.7 mg/dL (ref 1.7–2.4)

## 2022-10-31 MED ORDER — LIDOCAINE-PRILOCAINE 2.5-2.5 % EX CREA: 1.0000 | TOPICAL_CREAM | CUTANEOUS | Status: DC | PRN

## 2022-10-31 MED ORDER — PENTAFLUOROPROP-TETRAFLUOROETH EX AERO: 1.0000 | INHALATION_SPRAY | CUTANEOUS | Status: DC | PRN

## 2022-10-31 MED ORDER — LIDOCAINE HCL (PF) 1 % IJ SOLN: 5.0000 mL | INTRAMUSCULAR | Status: DC | PRN

## 2022-10-31 MED ORDER — ANTICOAGULANT SODIUM CITRATE 4% (200MG/5ML) IV SOLN: 5.0000 mL | Status: DC | PRN

## 2022-10-31 MED ORDER — ALTEPLASE 2 MG IJ SOLR: 2.0000 mg | Freq: Once | INTRAMUSCULAR | Status: DC | PRN

## 2022-10-31 MED ORDER — HEPARIN SODIUM (PORCINE) 1000 UNIT/ML DIALYSIS: 1000.0000 [IU] | INTRAMUSCULAR | Status: DC | PRN

## 2022-10-31 MED ORDER — HYDROCODONE-ACETAMINOPHEN 5-325 MG PO TABS
1.0000 | ORAL_TABLET | Freq: Four times a day (QID) | ORAL | Status: DC | PRN
  Administered 2022-10-31 – 2022-11-01 (×2): 2 via ORAL
  Filled 2022-10-31 (×2): qty 2

## 2022-10-31 MED ORDER — FENTANYL CITRATE PF 50 MCG/ML IJ SOSY
25.0000 ug | PREFILLED_SYRINGE | INTRAMUSCULAR | Status: DC | PRN
  Administered 2022-11-01: 25 ug via INTRAVENOUS
  Filled 2022-10-31: qty 1

## 2022-10-31 MED ORDER — ONDANSETRON 4 MG PO TBDP
4.0000 mg | ORAL_TABLET | Freq: Four times a day (QID) | ORAL | Status: DC | PRN
  Administered 2022-11-17 – 2023-01-05 (×5): 4 mg via ORAL
  Filled 2022-10-31 (×5): qty 1

## 2022-10-31 NOTE — Progress Notes (Addendum)
PROGRESS NOTE    Cynthia Dean  TRZ:[id number] DOB: April 21, 1951 DOA: 10/12/2022 PCP: Loistine Chance, MD  123A/123A-AA  LOS: 19 days   Brief hospital course:  Cynthia Dean is a 72 y/o F w/ PMH of ESRD on HD, COPD, DM2, HTN, depression, CVA who presented w/ back pain x 3 weeks.   Pt presented w/ back pain and was found to intradiscal abscess/lumbar discitis/b/l psoas abscess on MRI.   Hospital course prolonged due to patient intermittently refusing dialysis (see below). Further course and management as outlined below.   Assessment & Plan:  Lumbar discitis, L4-L5 epidural phlegmon and b/l psoas abscess on MRI S/p IR aspiration of psoas abscess on 10/13/22 Low back pain, Bilateral Leg Pain - due to above Vanco and cefepime stated on 10/13/22 after procedure.  Wound cx growing staph capitis.  ID consulted.  Abx de-escalated to cefazolin.  Not a good surgical candidate as per neuro surg.   Pt having severe low back and bilateral leg pain --cont cefazolin, for 6 weeks, End Date: 12/02/22 --pain management -- trial Norco, given ESRD will use IV fentanyl PRN and before dialysis     Confusion and agitation Hospital delirium --Pt was agitated, and also not responding to questions or commands during early hospitalization.  Mental status did start to improve, and now pt is calm and interactive, however, still seems confused and not totally coherent.  Pt reportedly was confused at baseline in recent months, and daughter was her Management consultant.  Pt did not appear to have decision-making capacity. --cont seroquel 50 mg nightly (new) to help with delirium and sleep.  ESRD: on HD MWF.  Pt refused HD on 10/17/22, 10/18/22 and 3/27, 4/1.   Since then, has been agreeable to dialysis without further issues.     4/8: pt again refusing dialysis, seemingly due to uncontrolled pain, but stating not willing even if we get pain controlled --Goals of care discussions ongoing.    Daughter/POA updated today (4/8), encourage family come visit with patient, understand what she is going through with suffering and honor patient's wishes --Palliative Care is following -- see their detailed notes related to capacity --Pt HAS capacity to make her decisions.   She has does however have waxing/waning changes in mentation and orientation likely related to hospital delirium and metabolic derangements of renal failure. --Needs to tolerate 4 hours in chair for outpatient dialysis     Generalized weakness:  PT recs SNF. TOC following for placement.    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 H&H are stable    Hypokalemia:  4/8: K 3.6 --monitor and replete PRN   Peripheral neuropathy --  4/4 - Cautious trial of gabapentin 100 mg daily (ESRD - monitor closely for side effects) 4% - Pt notes significant improvement and no side effects so far --Continue low dose gabapentin and monitor   Hx of seizures:  continue on home dose of Keppra   HLD:  continue on statin    Hx of COPD: w/o exacerbation.  Continue on bronchodilators    DM2: well controlled HbA1c 5.2.  --d/c'ed BG checks    HTN:  --Toprol XL resumed --Continue holding losartan    GERD:  continue on PPI    Depression: severity unknown.  Continue on home dose of sertraline     Overweight: BMI 28.2.  Would benefit from weight loss   Right foot pain - see peripheral neuropathy   DVT prophylaxis: Heparin SQ Code Status: Full code   Family  Communication: Updated daughter/POA, [name], by phone this afternoon. Encourages her and family to come see patient, talk with her, understand her suffering and consider proceeding with focus on pain control and quality of life over duration of survival, since she is going through suffering that we anticipate will be long term.     Consultants - Nephrology, Infectious Disease, Palliative Care  Level of care: Telemetry Medical  Dispo:   The patient is from:  home Anticipated d/c is to: SNF rehab Anticipated d/c date is: undetermined, will need outpatient dialysis chair time   Subjective and Interval History:  Pt was seen resting in bed this AM.  Dialysis was attempted in chair earlier, pt did not tolerate due to pain in both legs and back.  She is now stating that she does not want to continue with dialysis any more.  When asked if we could control her pain, would she want to try again.  She stated no.   Objective: Vitals:   10/30/22 1609 10/30/22 2000 10/31/22 0449 10/31/22 0700  BP: (!) 102/52 114/62 (!) 104/56 (!) 147/67  Pulse: 74 70 69 69  Resp: 18 18 18 16   Temp:  98.8 F (37.1 C) 98 F (36.7 C) 98.1 F (36.7 C)  TempSrc:  Oral Oral Oral  SpO2: 96% 99% 94% 99%  Weight:      Height:        Intake/Output Summary (Last 24 hours) at 10/31/2022 1234 Last data filed at 10/30/2022 2048 Gross per 24 hour  Intake 250 ml  Output --  Net 250 ml   Filed Weights   10/26/22 1151 10/26/22 1627  Weight: 94.6 kg 93.4 kg    Examination:   General exam: sleeping, woke to voice, drowsy appearing, no acute distress Respiratory system: CTAB, no wheezes,on room air, normal respiratory effort. Cardiovascular system: RRR, no pedal edema, port noted right upper chest wall Gastrointestinal system: soft, NT, ND  Central nervous system: A&O x3. no gross focal neurologic deficits Extremities:  LUE fistula noted, no edema, normal tone Skin: dry, intact, normal temperature Psychiatry: depressed mood, congruent affect, judgment and insight appear intact    Data Reviewed: I have personally reviewed labs and imaging studies  Time spent: 35 minutes  Pennie Banter, DO [facility] If 7PM-7AM, please contact night-coverage 10/31/2022, 12:34 PM

## 2022-10-31 NOTE — TOC Progression Note (Signed)
Transition of Care [facility]) - Progression Note    Patient Details  Name: Cynthia Dean MRN: [id number] Date of Birth: 1950/07/28  Transition of [facility]) CM/SW Contact  Allena Katz, LCSW Phone Number: 10/31/2022, 9:10 AM  Clinical Narrative:   Pt still unable to tolerate dialysis in a chair due to pain. CSW follow for updates and will start insurance auth for SNF once pt is medically stable to discharge.          Expected Discharge Plan and Services                                               Social Determinants of Health (SDOH) Interventions SDOH Screenings   Food Insecurity: No Food Insecurity (10/12/2022)  Housing: Low Risk  (10/12/2022)  Transportation Needs: No Transportation Needs (10/12/2022)  Utilities: Not At Risk (10/12/2022)  Financial Resource Strain: Low Risk  (11/12/2017)  Physical Activity: Insufficiently Active (11/12/2017)  Social Connections: Moderately Integrated (11/12/2017)  Stress: No Stress Concern Present (11/12/2017)  Tobacco Use: Medium Risk (10/12/2022)    Readmission Risk Interventions     No data to display

## 2022-10-31 NOTE — Progress Notes (Signed)
[facility]  ROUNDING NOTE   Subjective:   Cynthia Dean is a 72 y.o. 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 She presents to the ED with worsening lower extremity pain. Patient will be admitted for Back pain [M54.9] Bilateral leg pain [M79.604, M79.605]  Patient arrived to dialysis suite seated in recliner Patient visibly uncomfortable, intermittently crying out stating that she is in pain, her back hurts/legs hurt. HD staff attempted to adjust positioning for better comfort Patient tearful and requesting to return to her room due to pain Patient refusing dialysis.  Patient seen later in morning resting in bed Appears to be resting comfortably Patient states it was painful sitting in dialysis chair States she does not want to reattempt dialysis later today as planned. Patient is hesitant to agree to receive dialysis tomorrow.  Objective:  Vital signs in last 24 hours:  Temp:  [98 F (36.7 C)-98.8 F (37.1 C)] 98.1 F (36.7 C) (04/08 0700) Pulse Rate:  [69-74] 69 (04/08 0700) Resp:  [16-18] 16 (04/08 0700) BP: (102-147)/(52-67) 147/67 (04/08 0700) SpO2:  [94 %-99 %] 99 % (04/08 0700)  Weight change:  Filed Weights   10/26/22 1151 10/26/22 1627  Weight: 94.6 kg 93.4 kg    Intake/Output: I/O last 3 completed shifts: In: 430 [P.O.:420; I.V.:10] Out: -    Intake/Output this shift:  No intake/output data recorded.  Physical Exam: General: Tearful, restless, seated in chair  Head: Normocephalic, atraumatic.  Moist oral mucosal membranes  Eyes: Anicteric  Lungs:  Clear to auscultation, normal effort  Heart: Regular rate and rhythm  Abdomen:  Soft, nontender  Extremities: no peripheral edema.  Neurologic: Alert and oriented to self, place, situation  Skin: No lesions  Access: Left AVG    Basic Metabolic  Panel: Recent Labs  Lab 10/26/22 0528 10/28/22 0546 10/29/22 0814 10/30/22 0921 10/31/22 0450  NA 139 139 138 138 138  K 3.6 3.4* 3.1* 4.0 3.6  CL 104 101 95* 100 102  CO2 23 26 28 26 24   GLUCOSE 156* 188* 154* 161* 178*  BUN 52* 41* 26* 38* 45*  CREATININE 10.68* 8.55* 5.84* 7.37* 8.57*  CALCIUM 10.3 10.7* 9.8 10.0 9.5  MG 2.2 1.9  --   --  1.7     Liver Function Tests: No results for input(s): "AST", "ALT", "ALKPHOS", "BILITOT", "PROT", "ALBUMIN" in the last 168 hours.  No results for input(s): "LIPASE", "AMYLASE" in the last 168 hours. No results for input(s): "AMMONIA" in the last 168 hours.  CBC: Recent Labs  Lab 10/26/22 0528 10/28/22 0546 10/29/22 0814 10/30/22 0921 10/31/22 0450  WBC 7.9 12.3* 11.8* 10.5 9.2  HGB 9.3* 10.0* 10.1* 9.8* 9.3*  HCT 31.5* 32.9* 33.4* 33.6* 31.7*  MCV 81.4 79.7* 80.3 81.4 81.9  PLT 178 186 202 215 177     Cardiac Enzymes: No results for input(s): "CKTOTAL", "CKMB", "CKMBINDEX", "TROPONINI" in the last 168 hours.  BNP: Invalid input(s): "POCBNP"  CBG: Recent Labs  Lab 10/26/22 2030 10/27/22 0758 10/28/22 2207 10/31/22 0829 10/31/22 1152  GLUCAP 132* 123* 174* 149* 154*     Microbiology: Results for orders placed or performed during the hospital encounter of 10/12/22  Blood culture (routine x 2)     Status: None   Collection Time: 10/12/22  5:40 PM   Specimen: BLOOD  Result Value Ref Range Status   Specimen Description BLOOD BLOOD RIGHT HAND  [facility]  Final   Special Requests   Final    BOTTLES DRAWN AEROBIC AND ANAEROBIC Blood Culture results may not be optimal due to an inadequate volume of blood received in culture bottles   Culture   Final    NO GROWTH 5 DAYS Performed at [facility], [address] [place], [place] [address]    Report Status 10/17/2022 FINAL  Final  Blood culture (routine x 2)     Status: None   Collection Time: 10/12/22  6:56 PM   Specimen: BLOOD  Result Value Ref Range Status    Specimen Description BLOOD BLOOD RIGHT FOREARM  Final   Special Requests   Final    BOTTLES DRAWN AEROBIC ONLY Blood Culture results may not be optimal due to an inadequate volume of blood received in culture bottles   Culture   Final    NO GROWTH 5 DAYS Performed at [facility], [address], [place], [place] [address]    Report Status 10/17/2022 FINAL  Final  Aerobic/Anaerobic Culture w Gram Stain (surgical/deep wound)     Status: None   Collection Time: 10/13/22 11:44 AM   Specimen: Abscess  Result Value Ref Range Status   Specimen Description   Final    ABSCESS Performed at [facility], [address] [place], [place] [address]    Special Requests   Final    MUSCLE ABSCESS Performed at [facility], [address] [place], [place] [address]    Gram Stain   Final    ABUNDANT WBC PRESENT, PREDOMINANTLY PMN NO ORGANISMS SEEN    Culture   Final    FEW STAPHYLOCOCCUS CAPITIS NO ANAEROBES ISOLATED Performed at [facility], [address] [place], [place] [address]    Report Status 10/18/2022 FINAL  Final   Organism ID, Bacteria STAPHYLOCOCCUS CAPITIS  Final      Susceptibility   Staphylococcus capitis - MIC*    CIPROFLOXACIN <=0.5 SENSITIVE Sensitive     ERYTHROMYCIN >=8 RESISTANT Resistant     GENTAMICIN <=0.5 SENSITIVE Sensitive     OXACILLIN <=0.25 SENSITIVE Sensitive     TETRACYCLINE <=1 SENSITIVE Sensitive     VANCOMYCIN 1 SENSITIVE Sensitive     TRIMETH/SULFA <=10 SENSITIVE Sensitive     CLINDAMYCIN INTERMEDIATE Intermediate     RIFAMPIN <=0.5 SENSITIVE Sensitive     Inducible Clindamycin NEGATIVE Sensitive     * FEW STAPHYLOCOCCUS CAPITIS    Coagulation Studies: No results for input(s): "LABPROT", "INR" in the last 72 hours.  Urinalysis: No results for input(s): "COLORURINE", "LABSPEC", "PHURINE", "GLUCOSEU", "HGBUR", "BILIRUBINUR", "KETONESUR", "PROTEINUR", "UROBILINOGEN", "NITRITE", "LEUKOCYTESUR" in the last 72  hours.  Invalid input(s): "APPERANCEUR"    Imaging: No results found.   Medications:    anticoagulant sodium citrate      ceFAZolin (ANCEF) IV     [START ON 11/02/2022]  ceFAZolin (ANCEF) IV      ceFAZolin (ANCEF) IV Stopped (10/28/22 2248)   methocarbamol (ROBAXIN) IV 500 mg (10/25/22 2043)    (feeding supplement) PROSource Plus  30 mL Oral BID BM   acetaminophen  1,000 mg Oral Q8H   atorvastatin  80 mg Oral QHS   Chlorhexidine Gluconate Cloth  6 each Topical Q0600   cinacalcet  30 mg Oral Q supper   epoetin (EPOGEN/PROCRIT) injection  10,000 Units Intravenous Q M,W,F-HD   feeding supplement (NEPRO CARB STEADY)  237 mL Oral TID BM   gabapentin  100 mg Oral Daily  Gerhardt's butt cream   Topical QID   heparin injection (subcutaneous)  5,000 Units Subcutaneous Q8H   levETIRAcetam  1,000 mg Oral Daily   levETIRAcetam  250 mg Oral Q M,W,F   metoprolol succinate  100 mg Oral Daily   multivitamin  1 tablet Oral QHS   nystatin  5 mL Oral QID   pantoprazole  20 mg Oral BID   psyllium  1 packet Oral Daily   QUEtiapine  50 mg Oral QHS   sertraline  25 mg Oral Daily   sodium chloride flush  10-40 mL Intracatheter Q12H   umeclidinium-vilanterol  1 puff Inhalation Daily   alteplase, anticoagulant sodium citrate, fentaNYL (SUBLIMAZE) injection, heparin, hydrALAZINE, HYDROcodone-acetaminophen, lidocaine (PF), lidocaine-prilocaine, LORazepam, methocarbamol (ROBAXIN) IV, ondansetron, pentafluoroprop-tetrafluoroeth, senna-docusate, sodium chloride flush  Assessment/ Plan:  Ms. Velda ShellSophia Vanhook Dearing is a 72 y.o.  female with end stage renal disease on hemodialysis, coronary artery disease, COPD with intermittent home O2, hypertension, hyperlipidemia, stroke, diabetes mellitus type II, and breast cancer who was [facility] 10[facility]12[facility]2022 for Back pain [M54.9] Bilateral leg pain [M79.604, M79.605]  Patient was found to have vertebral osteomyelitis and psoas abscess. Cultures positive for  staph capitis  [facility] MWF Left AVG 102.5kg  End-stage renal disease on hemodialysis. Resumed on MWF schedule.  Patient seated in chair to receive dialysis however very uncomfortable, refusing dialysis and requesting return to her room.  According to HD staff, patient very uncomfortable during treatment on Friday.  They report she moaned and cried throughout her treatment.  Discussed with primary team who will attempt more aggressive pain management however limited due to bilateral medications and outpatient availability.  Will reattempt dialysis seated in chair tomorrow with pain med premedication.  Patient must be able to sit in a chair for at least 4 consecutive hours to receive outpatient dialysis.  This does not include being placed in a wheelchair to transport to and from clinic.  If patient is unable to safely do this, continuing dialysis will be challenging.  Patient also has to maintain a level of alertness during treatment at outpatient clinic, which will make pain medication dosing crucial.  2. Anemia of chronic kidney disease Normocytic Lab Results  Component Value Date   HGB 9.3 (L) 10/31/2022  Patient receives Mircera at outpatient clinic.  Currently receiving EPO with dialysis treatments.  Will continue this for now.  3. Secondary Hyperparathyroidism: with outpatient labs: PTH 180 (3/11), phosphorus 5.4, calcium 9.3  Today with hypercalcemia.   Lab Results  Component Value Date   PTH 131 (H) 03/14/2018   CALCIUM 9.5 10/31/2022   CAION 1.09 (L) 08/28/2022   PHOS 4.2 10/24/2022   -Continue Cinacalcet. - Avoid calcium or vitamin D agents.   4.  Hypotension on hemodialysis treatment. Holding home regimen of losartan, metoprolol and torsemide.  Blood pressure elevated today, 147/67.  Likely due to increased pain.  5. Diabetes mellitus type II with chronic kidney disease/renal manifestations: insulin dependent. Continue glucose control.   6. Vertebral  osteomyelitis, seen on CT lumber spine. Neurosurgery feels patient is not a surgical candidate CT guided aspiration of psoas abscess on 10/13/22. Cultures positive for Staph Capitis.   - Continue Cefazolin with hemodialysis for 8 weeks. Appreciate Infectious Disease input.    LOS: 19   4/8/20241:00 PM

## 2022-10-31 NOTE — Progress Notes (Signed)
Pt was brought down to dialysis via recliner. Upon entering tx area pt crying out that she is in pain.  Attempted to comfort her and to try an reposition her In recliner.  Pt yelling she doesn't want to do this  "I just want to go home" pt still crying out in pain and grabbing this nurses arm and asking for me to help her.  NP made aware of situation and pt put back in transport to go back to floor.  When I told pt that I would not be putting her on dialysis, she became much calmer and said "thank you, I do not want to be put on the machine"

## 2022-10-31 NOTE — Progress Notes (Signed)
PT Cancellation Note  Patient Details Name: Latangela Penate MRN: [id number] DOB: 05-16-1951   Cancelled Treatment:     Therapist in this pm to continue attempts at progressing mobility. Pt declined due to c/o not feeling well and unable to tolerate any activity. Continue PT per POC next available date/time.   Jannet Askew 10/31/2022, 3:19 PM

## 2022-11-01 DIAGNOSIS — N186 End stage renal disease: Secondary | ICD-10-CM | POA: Diagnosis not present

## 2022-11-01 DIAGNOSIS — M79604 Pain in right leg: Secondary | ICD-10-CM | POA: Diagnosis not present

## 2022-11-01 DIAGNOSIS — M4646 Discitis, unspecified, lumbar region: Secondary | ICD-10-CM | POA: Diagnosis not present

## 2022-11-01 DIAGNOSIS — K6812 Psoas muscle abscess: Secondary | ICD-10-CM | POA: Diagnosis not present

## 2022-11-01 DIAGNOSIS — Z7189 Other specified counseling: Secondary | ICD-10-CM | POA: Diagnosis not present

## 2022-11-01 LAB — GLUCOSE, CAPILLARY: Glucose-Capillary: 148 mg/dL — ABNORMAL HIGH (ref 70–99)

## 2022-11-01 MED ORDER — FENTANYL CITRATE PF 50 MCG/ML IJ SOSY: 12.5000 ug | PREFILLED_SYRINGE | INTRAMUSCULAR | Status: DC | PRN

## 2022-11-01 MED ORDER — FENTANYL CITRATE PF 50 MCG/ML IJ SOSY
12.5000 ug | PREFILLED_SYRINGE | INTRAMUSCULAR | Status: DC | PRN
  Administered 2022-11-01: 12.5 ug via INTRAVENOUS
  Administered 2022-11-01 – 2022-11-03 (×3): 25 ug via INTRAVENOUS
  Administered 2022-11-04 – 2022-11-06 (×2): 12.5 ug via INTRAVENOUS
  Administered 2022-11-07: 25 ug via INTRAVENOUS
  Filled 2022-11-01 (×7): qty 1

## 2022-11-01 MED ORDER — CEFAZOLIN SODIUM-DEXTROSE 1-4 GM/50ML-% IV SOLN: 1.0000 g | Freq: Every day | INTRAVENOUS | Status: DC

## 2022-11-01 MED ORDER — FENTANYL CITRATE PF 50 MCG/ML IJ SOSY: 25.0000 ug | PREFILLED_SYRINGE | INTRAMUSCULAR | Status: DC | PRN

## 2022-11-01 MED ORDER — EPOETIN ALFA 4000 UNIT/ML IJ SOLN: 4000.0000 [IU] | INTRAMUSCULAR | Status: DC

## 2022-11-01 NOTE — Plan of Care (Signed)
  Problem: Coping: Goal: Ability to adjust to condition or change in health will improve Outcome: Progressing   Problem: Fluid Volume: Goal: Ability to maintain a balanced intake and output will improve Outcome: Progressing   Problem: Skin Integrity: Goal: Risk for impaired skin integrity will decrease Outcome: Progressing   Problem: Clinical Measurements: Goal: Respiratory complications will improve Outcome: Progressing Goal: Cardiovascular complication will be avoided Outcome: Progressing   Problem: Activity: Goal: Risk for activity intolerance will decrease Outcome: Progressing   Problem: Pain Managment: Goal: General experience of comfort will improve Outcome: Progressing   Problem: Safety: Goal: Ability to remain free from injury will improve Outcome: Progressing   Problem: Skin Integrity: Goal: Risk for impaired skin integrity will decrease Outcome: Progressing

## 2022-11-01 NOTE — Progress Notes (Signed)
Pt brought down to dialysis in recliner.  Pt came in crying out in pain. Trying to comfort pt she remains crying out for help.  When asked if she wanted dialysis she yelled out twice "no, I do not want dialysis"  NP made aware.  While pt was still crying out.  Told to put pt back in transport to be taken back to floor.  Pt also kept calling out for her daughter.  Transport took pt back to floor

## 2022-11-01 NOTE — Progress Notes (Signed)
Insurance claims handler Liaison Note  Received request from [employer], [facility], that family is interested in Inpatient Unit [facility]). Spoke with Alinda Money, patient's son, who is at the bedside, to initiate education related to hospice philosophy, services, and team approach to care. He informs me that his sister, Tambre Tippens, is HCPOA and will need to be present for any conversation.  I called and spoke with [name].  She has taken the day off tomorrow and is able to be at bedside tomorrow to discuss hospice services and IPU criteria.  Hospital liaison team will follow up tomorrow.  Continued collaboration with patient/family and [facility] staff is ongoing through final disposition.  Please call with any hospice related questions or concerns. Thank you for the opportunity to participate in this patient's care.  Norris Cross, RN Nurse Liaison [phone]

## 2022-11-01 NOTE — Progress Notes (Signed)
[facility]  ROUNDING NOTE   Subjective:   Cynthia Dean is a 72 y.o. 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 She presents to the ED with worsening lower extremity pain. Patient will be admitted for Back pain [M54.9] Bilateral leg pain [M79.604, M79.605]  Patient seen and evaluated in dialysis suite Patient seated in recliner Crying, moaning, refusing dialysis Continues to have pain, despite pain medications given prior to arrival   Objective:  Vital signs in last 24 hours:  Temp:  [97.6 F (36.4 C)-98.2 F (36.8 C)] 97.6 F (36.4 C) (04/09 0731) Pulse Rate:  [64-72] 64 (04/09 0731) Resp:  [16-18] 16 (04/09 0731) BP: (107-135)/(39-74) 107/39 (04/09 0731) SpO2:  [94 %-100 %] 100 % (04/09 0731)  Weight change:  Filed Weights   10/26/22 1151 10/26/22 1627  Weight: 94.6 kg 93.4 kg    Intake/Output: I/O last 3 completed shifts: In: 840 [P.O.:620; I.V.:20; IV Piggyback:200] Out: -    Intake/Output this shift:  No intake/output data recorded.  Physical Exam: General: Tearful, seated in chair  Head: Normocephalic, atraumatic.  Moist oral mucosal membranes  Eyes: Anicteric  Lungs:  Clear to auscultation  Heart: Regular rate and rhythm  Abdomen:  Soft, nontender  Extremities: 1+ peripheral edema.  Neurologic: Alert and oriented to self  Skin: No lesions  Access: Left AVG    Basic Metabolic Panel: Recent Labs  Lab 10/26/22 0528 10/28/22 0546 10/29/22 0814 10/30/22 0921 10/31/22 0450  NA 139 139 138 138 138  K 3.6 3.4* 3.1* 4.0 3.6  CL 104 101 95* 100 102  CO2 23 26 28 26 24   GLUCOSE 156* 188* 154* 161* 178*  BUN 52* 41* 26* 38* 45*  CREATININE 10.68* 8.55* 5.84* 7.37* 8.57*  CALCIUM 10.3 10.7* 9.8 10.0 9.5  MG 2.2 1.9  --   --  1.7     Liver Function Tests: No results for input(s): "AST", "ALT",  "ALKPHOS", "BILITOT", "PROT", "ALBUMIN" in the last 168 hours.  No results for input(s): "LIPASE", "AMYLASE" in the last 168 hours. No results for input(s): "AMMONIA" in the last 168 hours.  CBC: Recent Labs  Lab 10/28/22 0546 10/29/22 0814 10/30/22 0921 10/31/22 0450 10/31/22 1446  WBC 12.3* 11.8* 10.5 9.2 8.7  HGB 10.0* 10.1* 9.8* 9.3* 9.8*  HCT 32.9* 33.4* 33.6* 31.7* 33.1*  MCV 79.7* 80.3 81.4 81.9 81.9  PLT 186 202 215 177 214     Cardiac Enzymes: No results for input(s): "CKTOTAL", "CKMB", "CKMBINDEX", "TROPONINI" in the last 168 hours.  BNP: Invalid input(s): "POCBNP"  CBG: Recent Labs  Lab 10/27/22 0758 10/28/22 2207 10/31/22 0829 10/31/22 1152 11/01/22 0734  GLUCAP 123* 174* 149* 154* 148*     Microbiology: Results for orders placed or performed during the hospital encounter of 10/12/22  Blood culture (routine x 2)     Status: None   Collection Time: 10/12/22  5:40 PM   Specimen: BLOOD  Result Value Ref Range Status   Specimen Description BLOOD BLOOD RIGHT HAND [facility]  Final   Special Requests   Final    BOTTLES DRAWN AEROBIC AND ANAEROBIC Blood Culture results may not be optimal due to an inadequate volume of blood received in culture bottles   Culture   Final    NO GROWTH 5 DAYS Performed at [facility], [address] [place], [place] [address]    Report Status 10/17/2022 FINAL  Final  Blood culture (routine x 2)     Status: None   Collection Time: 10/12/22  6:56 PM   Specimen: BLOOD  Result Value Ref Range Status   Specimen Description BLOOD BLOOD RIGHT FOREARM  Final   Special Requests   Final    BOTTLES DRAWN AEROBIC ONLY Blood Culture results may not be optimal due to an inadequate volume of blood received in culture bottles   Culture   Final    NO GROWTH 5 DAYS Performed at [facility], [address] [place], [place] [address]    Report Status 10/17/2022 FINAL  Final  Aerobic/Anaerobic Culture w Gram Stain  (surgical/deep wound)     Status: None   Collection Time: 10/13/22 11:44 AM   Specimen: Abscess  Result Value Ref Range Status   Specimen Description   Final    ABSCESS Performed at [facility], [address] [place], [place] [address]    Special Requests   Final    MUSCLE ABSCESS Performed at [facility], [address] [place], [place] [address]    Gram Stain   Final    ABUNDANT WBC PRESENT, PREDOMINANTLY PMN NO ORGANISMS SEEN    Culture   Final    FEW STAPHYLOCOCCUS CAPITIS NO ANAEROBES ISOLATED Performed at [facility], [address] [place], [place] [address]    Report Status 10/18/2022 FINAL  Final   Organism ID, Bacteria STAPHYLOCOCCUS CAPITIS  Final      Susceptibility   Staphylococcus capitis - MIC*    CIPROFLOXACIN <=0.5 SENSITIVE Sensitive     ERYTHROMYCIN >=8 RESISTANT Resistant     GENTAMICIN <=0.5 SENSITIVE Sensitive     OXACILLIN <=0.25 SENSITIVE Sensitive     TETRACYCLINE <=1 SENSITIVE Sensitive     VANCOMYCIN 1 SENSITIVE Sensitive     TRIMETH/SULFA <=10 SENSITIVE Sensitive     CLINDAMYCIN INTERMEDIATE Intermediate     RIFAMPIN <=0.5 SENSITIVE Sensitive     Inducible Clindamycin NEGATIVE Sensitive     * FEW STAPHYLOCOCCUS CAPITIS    Coagulation Studies: No results for input(s): "LABPROT", "INR" in the last 72 hours.  Urinalysis: No results for input(s): "COLORURINE", "LABSPEC", "PHURINE", "GLUCOSEU", "HGBUR", "BILIRUBINUR", "KETONESUR", "PROTEINUR", "UROBILINOGEN", "NITRITE", "LEUKOCYTESUR" in the last 72 hours.  Invalid input(s): "APPERANCEUR"    Imaging: No results found.   Medications:    anticoagulant sodium citrate      ceFAZolin (ANCEF) IV 2 g (10/31/22 1445)   [START ON 11/02/2022]  ceFAZolin (ANCEF) IV      ceFAZolin (ANCEF) IV Stopped (10/28/22 2248)   methocarbamol (ROBAXIN) IV 500 mg (10/25/22 2043)    (feeding supplement) PROSource Plus  30 mL Oral BID BM   atorvastatin  80 mg Oral QHS    Chlorhexidine Gluconate Cloth  6 each Topical Q0600   cinacalcet  30 mg Oral Q supper   epoetin (EPOGEN/PROCRIT) injection  10,000 Units Intravenous Q M,W,F-HD   feeding supplement (NEPRO CARB STEADY)  237 mL Oral TID BM   gabapentin  100 mg Oral Daily   Gerhardt's butt cream   Topical QID   heparin injection (subcutaneous)  5,000 Units Subcutaneous Q8H   levETIRAcetam  1,000 mg Oral Daily   levETIRAcetam  250 mg Oral Q M,W,F   metoprolol succinate  100 mg Oral Daily   multivitamin  1 tablet Oral QHS   nystatin  5 mL Oral QID   pantoprazole  20 mg Oral BID   psyllium  1 packet Oral Daily  QUEtiapine  50 mg Oral QHS   sertraline  25 mg Oral Daily   sodium chloride flush  10-40 mL Intracatheter Q12H   umeclidinium-vilanterol  1 puff Inhalation Daily   alteplase, anticoagulant sodium citrate, fentaNYL (SUBLIMAZE) injection, heparin, hydrALAZINE, HYDROcodone-acetaminophen, lidocaine (PF), lidocaine-prilocaine, LORazepam, methocarbamol (ROBAXIN) IV, ondansetron, pentafluoroprop-tetrafluoroeth, senna-docusate, sodium chloride flush  Assessment/ Plan:  Cynthia Dean is a 72 y.o.  female with end stage renal disease on hemodialysis, coronary artery disease, COPD with intermittent home O2, hypertension, hyperlipidemia, stroke, diabetes mellitus type II, and breast cancer who was admitted to [facility] on 10/12/2022 for Back pain [M54.9] Bilateral leg pain [M79.604, M79.605]  Patient was found to have vertebral osteomyelitis and psoas abscess. Cultures positive for staph capitis  [facility] MWF Left AVG 102.5kg  End-stage renal disease on hemodialysis. Resumed on MWF schedule.    Patient seated in recliner, continues to complain of pain despite pre-medication. Adamantly refusing dialysis. Palliative care consulted to assist with goal of care.   Patient must be able to sit in recliner to continue outpatient dialysis treatments.  Pain management increased yesterday.   2.  Anemia of chronic kidney disease Normocytic Lab Results  Component Value Date   HGB 9.8 (L) 10/31/2022  Patient receives Mircera at outpatient clinic.  Will reduce to low dose EPO.   3. Secondary Hyperparathyroidism: with outpatient labs: PTH 180 (3/11), phosphorus 5.4, calcium 9.3   Lab Results  Component Value Date   PTH 131 (H) 03/14/2018   CALCIUM 9.5 10/31/2022   CAION 1.09 (L) 08/28/2022   PHOS 4.2 10/24/2022   -Continue Cinacalcet. -Will continue to monitor bone minerals during this admission.  4.  Hypotension on hemodialysis treatment. Holding home regimen of losartan, metoprolol and torsemide.    5. Diabetes mellitus type II with chronic kidney disease/renal manifestations: insulin dependent. Sliding scale insulin to be managed by primary team.  6. Vertebral osteomyelitis, seen on CT lumber spine. Neurosurgery feels patient is not a surgical candidate CT guided aspiration of psoas abscess on 10/13/22. Cultures positive for Staph Capitis.   - Continue Cefazolin with hemodialysis for 8 weeks. Appreciate Infectious Disease input.    LOS: 20   4/9/202410:44 AM

## 2022-11-01 NOTE — Progress Notes (Addendum)
Daily Progress Note   Patient Name: Cynthia Dean       Date: 11/01/2022 DOB: 08/08/1950  Age: [age] y.o. MRN#: [id number] Attending Physician: Pennie BanterGriffith, Kelly A, DO Primary Care Physician: Loistine ChanceBergdolt, Barbara A, MD Admit Date: 10/12/2022  Reason for Consultation/Follow-up: Establishing goals of care  Subjective: Notes and labs reviewed. In to see patient. She is sitting in bedside chair. She states she does not want to do dialysis due to pain. Attempted to discuss pain management and she waivered in her response.   Attempted to call daughter who is HPOA x2 and went directly to VM.   Could consider using IV Fentanyl 25mcg hourly if able, to duplicate a Fentanyl patch and determine if this would help with decision making.   ADDENDUM: Advised by nephrology that daughter is at bedside.  Arrived to bedside and patient's significant other is present but daughter is not.  He called daughter on his cell phone, on speaker phone.  Patient was sleeping soundly.  They discussed their conversation with nephrology, and daughter states that she has decided to allow her mother to shift to comfort care and die with peace and dignity.  She states she does not want her mother to suffer any longer.  She states she would want her mother to go to the hospice facility.  I completed a MOST form today electronically through [employer]. The patient and family outlined their wishes for the following treatment decisions:  Cardiopulmonary Resuscitation: Do Not Attempt Resuscitation (DNR/No CPR)  Medical Interventions: Comfort Measures: Keep clean, warm, and dry. Use medication by any route, positioning, wound care, and other measures to relieve pain and suffering. Use oxygen, suction and manual treatment of airway obstruction  as needed for comfort. Do not transfer to the hospital unless comfort needs cannot be met in current location.  Antibiotics: No antibiotics (use other measures to relieve symptoms)  IV Fluids: No IV fluids (provide other measures to ensure comfort)  Feeding Tube: No feeding tube     Length of Stay: 20  Current Medications: Scheduled Meds:   (feeding supplement) PROSource Plus  30 mL Oral BID BM   atorvastatin  80 mg Oral QHS   Chlorhexidine Gluconate Cloth  6 each Topical Q0600   cinacalcet  30 mg Oral Q supper   epoetin (EPOGEN/PROCRIT) injection  10,000  Units Intravenous Q M,W,F-HD   feeding supplement (NEPRO CARB STEADY)  237 mL Oral TID BM   gabapentin  100 mg Oral Daily   Gerhardt's butt cream   Topical QID   heparin injection (subcutaneous)  5,000 Units Subcutaneous Q8H   levETIRAcetam  1,000 mg Oral Daily   levETIRAcetam  250 mg Oral Q M,W,F   metoprolol succinate  100 mg Oral Daily   multivitamin  1 tablet Oral QHS   nystatin  5 mL Oral QID   pantoprazole  20 mg Oral BID   psyllium  1 packet Oral Daily   QUEtiapine  50 mg Oral QHS   sertraline  25 mg Oral Daily   sodium chloride flush  10-40 mL Intracatheter Q12H   umeclidinium-vilanterol  1 puff Inhalation Daily    Continuous Infusions:  anticoagulant sodium citrate      ceFAZolin (ANCEF) IV 2 g (10/31/22 1445)   [START ON 11/02/2022]  ceFAZolin (ANCEF) IV      ceFAZolin (ANCEF) IV Stopped (10/28/22 2248)   methocarbamol (ROBAXIN) IV 500 mg (10/25/22 2043)    PRN Meds: alteplase, anticoagulant sodium citrate, fentaNYL (SUBLIMAZE) injection, heparin, hydrALAZINE, HYDROcodone-acetaminophen, lidocaine (PF), lidocaine-prilocaine, LORazepam, methocarbamol (ROBAXIN) IV, ondansetron, pentafluoroprop-tetrafluoroeth, senna-docusate, sodium chloride flush  Physical Exam Pulmonary:     Effort: Pulmonary effort is normal.  Neurological:     Mental Status: She is alert.             Vital Signs: BP (!) 107/39 (BP  Location: Right Arm)   Pulse 64   Temp 97.6 F (36.4 C) (Oral)   Resp 16   Ht 6' (1.829 m)   Wt 93.4 kg   SpO2 100%   BMI 27.93 kg/m  SpO2: SpO2: 100 % O2 Device: O2 Device: Room Air O2 Flow Rate: O2 Flow Rate (L/min): 3 L/min  Intake/output summary:  Intake/Output Summary (Last 24 hours) at 11/01/2022 1109 Last data filed at 11/01/2022 0300 Gross per 24 hour  Intake 590 ml  Output --  Net 590 ml   LBM: Last BM Date : 10/31/22 Baseline Weight: Weight: 113.4 kg Most recent weight: Weight:  (Unable to weigh, Pt in recliner, cannot stand)   Patient Active Problem List   Diagnosis Date Noted   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/17/2022   Back pain 10/12/2022   Discitis of lumbar region 10/12/2022   Myositis 10/12/2022   Hemoglobin drop 08/28/2022   Chronic respiratory failure with hypoxia 08/26/2022   Acute on chronic blood loss anemia 08/26/2022   History of lower GI bleeding 08/26/2022   Insulin-requiring or dependent type II diabetes mellitus 08/26/2022   Chronic anticoagulation 08/26/2022   History of pulmonary embolism 03/20/2022 08/26/2022   Breast cancer, left S/P partial mastectomy,03/04/2022 08/26/2022   Generalized weakness    Unable to ambulate 05/09/2022   ESRD on dialysis    Bilateral leg pain    Depression    Pulmonary emboli    Obesity with body mass index (BMI) of 30.0 to 39.9    Anemia    Chronic diastolic CHF (congestive heart failure)    Seroma of breast    Bacteremia due to coagulase-negative Staphylococcus    Left breast abscess    Urinary tract infection 03/22/2022   Anemia of chronic disease 03/22/2022   Acute pulmonary embolism without acute cor pulmonale 03/20/2022   Dyslipidemia 03/20/2022   Diabetic neuropathy 03/20/2022   Sepsis due to cellulitis 03/20/2022   Delay kidney tx func d/t fluid overload requiring acute dialysis 01/29/2020  Delay kidney tx func d/t ATN and fluid overload require acute dialysis 01/29/2020   Hypoglycemia 10/21/2019    Unresponsiveness 10/21/2019   GERD (gastroesophageal reflux disease) 10/21/2019   Hypothermia    Sepsis with encephalopathy without septic shock    Diarrhea    Acute metabolic encephalopathy    Acute on chronic diastolic CHF (congestive heart failure)    Acute on chronic respiratory failure with hypoxia 07/30/2019   Rectal bleed 02/13/2019   Acute respiratory failure with hypoxia 12/31/2018   History of CVA (cerebrovascular accident) 05/29/2018   Aphasia as late effect of cerebrovascular accident (CVA) 05/01/2018   Seizure disorder 03/14/2018   Stroke 03/07/2018   Slurred speech 11/14/2017   Type 2 diabetes mellitus with ESRD (end-stage renal disease) 11/12/2017   CAD (coronary artery disease) 11/12/2017   COPD (chronic obstructive pulmonary disease) 11/12/2017   ESRD on hemodialysis 11/12/2017   CVA (cerebral vascular accident) 04/14/2016   Essential hypertension 11/27/2015   Hyperlipidemia 11/27/2015   Prolonged Q-T interval on ECG 11/26/2015   Aphasia complicating stroke 11/05/2015   Cervical spine arthritis 07/27/2015   Diabetic retinopathy without macular edema associated with type 2 diabetes mellitus 07/27/2015   Osteoarthritis of spine with radiculopathy, lumbar region 07/27/2015   Obesity, Class III, BMI 40-49.9 (morbid obesity) 07/27/2015   Vitamin D deficiency 01/21/2011   Diastolic heart failure 10/27/2010    Palliative Care Assessment & Plan    Recommendations/Plan: Attempted to call daughter who is HPOA x2 and went directly to VM.   Could consider using IV Fentanyl hourly if able, to duplicate a Fentanyl patch and determine if this would help with decision making.   ADDENDUM: Daughter is amenable to comfort care and hospice facility placement.  She was educated by nephrology on what end-of-life would look like, and was able to articulate this accurately.   Order in place for 12.5 mcg fentanyl for moderate pain, 25 mcg fentanyl for severe pain every hour as  needed.  Would consider starting a fentanyl patch depending on patient's needs prior to discharge.  Code Status:    Code Status Orders  (From admission, onward)           Start     Ordered   10/12/22 1100  Full code  Continuous       Question:  By:  Answer:  Consent: discussion documented in EHR   10/12/22 1101           Code Status History     Date Active Date Inactive Code Status Order ID Comments User Context   08/26/2022 2018 09/09/2022 2244 Full Code [id number]  Andris Baumann, MD ED   05/09/2022 0919 05/17/2022 0109 Full Code [id number]  Lorretta Harp, MD ED   03/20/2022 0121 04/02/2022 0126 Full Code [id number]  Mansy, Vernetta Honey, MD ED   01/29/2020 2038 01/30/2020 2215 Full Code [id number]  Lucile Shutters, MD Inpatient   01/29/2020 1210 01/29/2020 2038 Full Code [id number]  Lucile Shutters, MD Inpatient   10/21/2019 0902 10/25/2019 2211 Full Code [id number]  Lorretta Harp, MD ED   07/30/2019 2224 08/11/2019 2130 Full Code [id number]  Mansy, Vernetta Honey, MD ED   02/13/2019 1329 02/16/2019 1849 Full Code [id number]  Jimmye Norman, NP ED   12/31/2018 1159 01/02/2019 1330 Full Code [id number]  Jimmye Norman, NP ED   05/29/2018 1541 05/30/2018 2041 Full Code [id number]  Alford Highland, MD ED   03/14/2018 0141 03/15/2018 0336 Full Code [id number]  Cammy Copa, MD Inpatient  03/08/2018 0005 03/09/2018 2108 Full Code [id number]  Altamese Dilling, MD Inpatient   11/15/2017 0017 11/17/2017 1809 Full Code [id number]  Oralia Manis, MD ED   11/12/2017 2219 11/13/2017 1935 Full Code [id number]  Oralia Manis, MD ED   04/14/2016 1330 04/14/2016 1700 Full Code [id number]  Adrian Saran, MD ED   08/08/2015 2257 08/09/2015 2054 Full Code [id number]  Hower, Cletis Athens, MD ED   04/17/2015 1334 04/17/2015 1836 Full Code [id number]  Dew, Marlow Baars, MD Inpatient      Advance Directive Documentation    Flowsheet Row Most Recent Value  Type of Advance Directive Healthcare Power of Attorney  Pre-existing out of facility DNR order  (yellow form or pink MOST form) --  "MOST" Form in Place? --       Prognosis:  Poor overall  Care plan was discussed with medical team via epic chat.   Thank you for allowing the Palliative Medicine Team to assist in the care of this patient.    Morton Stall, NP  Please contact Palliative Medicine Team phone at [phone] for questions and concerns.

## 2022-11-01 NOTE — Progress Notes (Signed)
PROGRESS NOTE    Cynthia Dean  VFI:[id number] DOB: Oct 28, 1950 DOA: 10/12/2022 PCP: Loistine Chance, MD  [id number]  LOS: 19 days   Brief hospital course:  Cynthia Dean is a 72 y/o F w/ PMH of ESRD on HD, COPD, DM2, HTN, depression, CVA who presented w/ back pain x 3 weeks.   Pt presented w/ back pain and was found to intradiscal abscess/lumbar discitis/b/l psoas abscess on MRI.   Hospital course prolonged due to patient intermittently refusing dialysis (see below). Further course and management as outlined below.   Assessment & Plan:  Lumbar discitis, L4-L5 epidural phlegmon and b/l psoas abscess on MRI S/p IR aspiration of psoas abscess on 10/13/22 Low back pain, Bilateral Leg Pain - due to above Vanco and cefepime stated on 10/13/22 after procedure.  Wound cx growing staph capitis.  ID consulted.  Abx de-escalated to cefazolin.  Not a good surgical candidate as per neuro surg.   Pt having severe low back and bilateral leg pain --cont cefazolin, for 6 weeks, End Date: 12/02/22 --pain management -- trial Norco, given ESRD will use IV fentanyl PRN and before dialysis     Confusion and agitation Hospital delirium --Pt was agitated, and also not responding to questions or commands during early hospitalization.  Mental status did start to improve, and now pt is calm and interactive, however, still seems confused and not totally coherent.  Pt reportedly was confused at baseline in recent months, and daughter was her Management consultant.  Pt did not appear to have decision-making capacity. --cont seroquel 50 mg nightly (new) to help with delirium and sleep.   ESRD: on HD MWF.  Pt refused HD on 10/17/22, 10/18/22 and 3/27, 4/1.   Since then, has been agreeable to dialysis without further issues, getting dialysis in hospital bed. When attempts made to do HD in chair, she did not tolerate      Goals of Care  4/8: pt again refusing dialysis, due to uncontrolled pain, but  stating not willing even if we get pain controlled 4/9: pt continues to decline dialysis, pain uncontrolled, pain mgmt escalation limited by hypotension if continuing full scope of care, suffering immensely.   --Family discussions underway.   --Anticipate they will decide on hospice --Palliative Care is following -- see their detailed notes related to capacity --Pt HAS capacity to make her decisions.   She has does however have waxing/waning changes in mentation and orientation likely related to hospital delirium and metabolic derangements of renal failure. --Needs to tolerate 4 hours in chair for outpatient dialysis, cannot tolerate due to back and leg pain    Generalized weakness:  PT recs SNF. TOC following.  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 H&H are stable    Hypokalemia:  4/8: K 3.6 --monitor and replete PRN   Peripheral neuropathy --  4/4 - Cautious trial of gabapentin 100 mg daily (ESRD - monitor closely for side effects) 4% - Pt notes significant improvement and no side effects so far --Continue low dose gabapentin and monitor   Hx of seizures:  continue on home dose of Keppra   HLD:  continue on statin    Hx of COPD: w/o exacerbation.  Continue on bronchodilators    DM2: well controlled HbA1c 5.2.  --d/c'ed BG checks    HTN:  --Toprol XL resumed --Continue holding losartan    GERD:  continue on PPI    Depression: severity unknown.  Continue on home dose of sertraline  Overweight: BMI 28.2.  Wt loss efforts recommended  Right foot pain - see peripheral neuropathy   DVT prophylaxis: Heparin SQ Code Status: Full code   Family Communication: Updated daughter/POA, [name], and other present family members at bedside on rounds today   Consultants - Nephrology, Infectious Disease, Palliative Care  Level of care: Telemetry Medical  Dispo:   The patient is from: home Anticipated d/c is to: SNF vs hospice (at home if feasible) Anticipated d/c date is:  TBD pending GOC decisions   Subjective and Interval History:  Pt was seen with family and nephrology team at bedside today, having talks about her goals of care and wishes to no longer have dialysis due to her unrelenting pain with attempts at sitting in chair.  Family seem willing to honor her wishes and considering hospice.  We left the room to allow family time in private to visit and talk about it.  Pt was quite tearful throughout, daughter as well. Daughter told patient multiple times she wants to do what the patient wants, and does not want her to suffer.   Objective: Vitals:   10/30/22 1609 10/30/22 2000 10/31/22 0449 10/31/22 0700  BP: (!) 102/52 114/62 (!) 104/56 (!) 147/67  Pulse: 74 70 69 69  Resp: 18 18 18 16   Temp:  98.8 F (37.1 C) 98 F (36.7 C) 98.1 F (36.7 C)  TempSrc:  Oral Oral Oral  SpO2: 96% 99% 94% 99%  Weight:      Height:        Intake/Output Summary (Last 24 hours) at 10/31/2022 1234 Last data filed at 10/30/2022 2048 Gross per 24 hour  Intake 250 ml  Output --  Net 250 ml   Filed Weights   10/26/22 1151 10/26/22 1627  Weight: 94.6 kg 93.4 kg    Examination:   General exam: awake alert, no acute distress but tearful  Respiratory system: on room air, normal respiratory effort. Cardiovascular system: RRR, no pedal edema Gastrointestinal system: soft, NT, ND  Central nervous system: A&O x3. no gross focal neurologic deficits Extremities:  LUE fistula noted, no edema, normal tone Skin: dry, intact, normal temperature Psychiatry: depressed mood, congruent affect, judgment and insight intact    Data Reviewed: I have personally reviewed labs and imaging studies  Time spent: 35 minutes  Pennie Banter, DO [facility] If 7PM-7AM, please contact night-coverage 10/31/2022, 12:34 PM

## 2022-11-01 NOTE — Consult Note (Signed)
Pharmacy Antibiotic Note  Cynthia Dean is a 72 y.o. female admitted on 10/12/2022 with  discitis and bilateral psoas abscess .  Pharmacy has been consulted for cefazolin dosing. Of note, patient receives hemodialysis MWF outpatient. Plan is to continue regimen while inpatient. IR aspiration of abscess on 3/21 with culture growing Methicillin-susceptible S. Capitis.   Today, 11/01/2022 Day #20 antibiotics Renal: ESRD on HD MWF Refused HD 4/8 and 4/9 WBC WNL Afebrile Psoas abscess cx: Methicillin-susc S capitis  Plan Currently on cefazolin 2g IV with HD on Mon/Wed and 3gm IV with HD on Fri. Since refusing HD, will likely change cefazolin back to 1gm IV q24h OPAT completed for 2g-2g-3g - needs to f/u plans for HD  Height: 6' (182.9 cm) Weight:  (Unable to weigh, Pt in recliner, cannot stand) IBW/kg (Calculated) : 73.1  Temp (24hrs), Avg:97.8 F (36.6 C), Min:97.6 F (36.4 C), Max:98.2 F (36.8 C)  Recent Labs  Lab 10/26/22 0528 10/28/22 0546 10/29/22 0814 10/30/22 0921 10/31/22 0450 10/31/22 1446  WBC 7.9 12.3* 11.8* 10.5 9.2 8.7  CREATININE 10.68* 8.55* 5.84* 7.37* 8.57*  --      Estimated Creatinine Clearance: 7.7 mL/min (A) (by C-G formula based on SCr of 8.57 mg/dL (H)).    Allergies  Allergen Reactions   Oxycodone Nausea And Vomiting   Oxycodone-Acetaminophen Other (See Comments)   Wound Dressing Adhesive    Hydrocodone     Intolerant more than allergic   Tape Itching    Skin Dermatitis/itching (tape adhesive) Skin Dermatitis/itching (tape adhesive)   Tapentadol Itching    Skin Dermatitis/itching (tape adhesive) Skin Dermatitis/itching (tape adhesive)     Antimicrobials this admission: Vancomycin 3/21 >> 3/26 Cefepime 3/21 >> 3/25 Cefazolin 3/26 >>  Dose adjustments this admission: ESRD  Microbiology results: 3/20 BCx: NGTD 3/21 Abscess cx: meth-susc Staph capitis 3/21 MRSA PCR: pending  Thank you for allowing pharmacy to be a part of this  patient's care.  Juliette Alcide, PharmD, BCPS, BCIDP Work Cell: [phone] 11/01/2022 12:52 PM

## 2022-11-02 DIAGNOSIS — M4646 Discitis, unspecified, lumbar region: Secondary | ICD-10-CM | POA: Diagnosis not present

## 2022-11-02 LAB — RENAL FUNCTION PANEL
Albumin: 2.3 g/dL — ABNORMAL LOW (ref 3.5–5.0)
Anion gap: 14 (ref 5–15)
BUN: 59 mg/dL — ABNORMAL HIGH (ref 8–23)
CO2: 21 mmol/L — ABNORMAL LOW (ref 22–32)
Calcium: 9.1 mg/dL (ref 8.9–10.3)
Chloride: 106 mmol/L (ref 98–111)
Creatinine, Ser: 10.74 mg/dL — ABNORMAL HIGH (ref 0.44–1.00)
GFR, Estimated: 3 mL/min — ABNORMAL LOW (ref >60)
Glucose, Bld: 174 mg/dL — ABNORMAL HIGH (ref 70–99)
Phosphorus: 6 mg/dL — ABNORMAL HIGH (ref 2.5–4.6)
Potassium: 3.4 mmol/L — ABNORMAL LOW (ref 3.5–5.1)
Sodium: 141 mmol/L (ref 135–145)

## 2022-11-02 LAB — CBC
HCT: 32.3 % — ABNORMAL LOW (ref 36.0–46.0)
Hemoglobin: 9.3 g/dL — ABNORMAL LOW (ref 12.0–15.0)
MCH: 24.2 pg — ABNORMAL LOW (ref 26.0–34.0)
MCHC: 28.8 g/dL — ABNORMAL LOW (ref 30.0–36.0)
MCV: 84.1 fL (ref 80.0–100.0)
Platelets: 210 10*3/uL (ref 150–400)
RBC: 3.84 MIL/uL — ABNORMAL LOW (ref 3.87–5.11)
RDW: 21.8 % — ABNORMAL HIGH (ref 11.5–15.5)
WBC: 7 10*3/uL (ref 4.0–10.5)
nRBC: 0 % (ref 0.0–0.2)

## 2022-11-02 MED ORDER — PENTAFLUOROPROP-TETRAFLUOROETH EX AERO
INHALATION_SPRAY | CUTANEOUS | Status: AC
  Filled 2022-11-02: qty 30

## 2022-11-02 MED ORDER — CEFAZOLIN IN SODIUM CHLORIDE 3-0.9 GM/100ML-% IV SOLN
3.0000 g | INTRAVENOUS | Status: DC
  Administered 2022-11-04: 3 g via INTRAVENOUS
  Filled 2022-11-02 (×2): qty 100

## 2022-11-02 MED ORDER — FENTANYL 12 MCG/HR TD PT72
1.0000 | MEDICATED_PATCH | TRANSDERMAL | Status: DC
  Administered 2022-11-02 – 2022-11-08 (×3): 1 via TRANSDERMAL
  Filled 2022-11-02 (×3): qty 1

## 2022-11-02 MED ORDER — CEFAZOLIN SODIUM-DEXTROSE 2-4 GM/100ML-% IV SOLN
2.0000 g | INTRAVENOUS | Status: DC
  Filled 2022-11-02: qty 100

## 2022-11-02 MED ORDER — CEFAZOLIN SODIUM-DEXTROSE 2-4 GM/100ML-% IV SOLN
2.0000 g | INTRAVENOUS | Status: DC
  Filled 2022-11-02 (×2): qty 100

## 2022-11-02 MED ORDER — CEFAZOLIN SODIUM-DEXTROSE 2-4 GM/100ML-% IV SOLN
2.0000 g | INTRAVENOUS | Status: DC
  Administered 2022-11-02 – 2022-11-09 (×2): 2 g via INTRAVENOUS
  Filled 2022-11-02 (×3): qty 100

## 2022-11-02 NOTE — Progress Notes (Signed)
Progress Note   [name] XOV:[id number] DOB: 1951/07/24 DOA: 10/12/2022     21 DOS: the patient was seen and examined on 11/02/2022   Subjective:  Patient seen and examined at bedside this morning Had extensive goals of care meeting with patient as well as patient's family and case management. She denied nausea vomiting abdominal pain or worsening respiratory function.   Brief hospital course:   Nicolasa Jerilyn Scire is a 72 y/o F w/ PMH of ESRD on HD, COPD, DM2, HTN, depression, CVA who presented w/ back pain x 3 weeks.  Pt presented w/ back pain and was found to intradiscal abscess/lumbar discitis/b/l psoas abscess on MRI status post IR guided drainage. Patient has had waxing and waning mental status with intermittently refusing dialysis. Patient's family have decided to continue hemodialysis after speaking with their PCP.     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  Wound cx growing staph capitis.  ID consulted.  Abx de-escalated to cefazolin.  Not a good surgical candidate as per neuro surg.   Pt having severe low back and bilateral leg pain --cont cefazolin, for 6 weeks, End Date: 12/02/22 --pain management -- trial Norco, given ESRD will use IV fentanyl PRN and before dialysis      Confusion and agitation Hospital delirium --Pt was agitated, and also not responding to questions or commands during early hospitalization.  Mental status did start to improve, and now pt is calm and interactive, however, at this time she has been having waxing and waning mental status --cont seroquel 50 mg nightly (new) to help with delirium and sleep.     ESRD: on HD MWF.  Pt refused HD on 10/17/22, 10/18/22 and 3/27, 4/1.   Since then, has been agreeable to dialysis without  further issues, getting dialysis in hospital bed. When attempts made to do HD in chair, she did not tolerate on account of severe back pain   Goals of Care  4/10: I understand goals of care were discussed on 11/01/2022 with patient as well as patient's family at bedside and they had understanding that there was no other option for hemodialysis without being able to sit in the recliner and so they went with the option of hospice.  Patient's family had discussion with patient's PCP who made him aware that since her low back pain is due to acute findings of discitis of L4-L5 vertebra and have not had a chance of adequate treatment of this acute discitis to determine whether her back pain will improve to give her a chance of continuation of hemodialysis, they believe decision for hospice was premature.  I personally spoke with the family as well as patient's PCP.  We are looking at options of getting patient accepted to a hemodialysis capable facility since patient is unable to go to any facility outside those with dialysis capability.  I spoke with patient today and both patient and the family expressed interest to continue hemodialysis.     Generalized weakness:  PT recs SNF. TOC following.   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 H&H are stable    Hypokalemia:  4/8: K 3.6 --monitor and replete PRN    Peripheral neuropathy --  4/4 - Cautious trial of gabapentin 100 mg daily (ESRD - monitor closely for side effects)  4% - Pt notes significant improvement and no side effects so far --Continue low dose gabapentin and monitor   Hx of seizures:  continue on home dose of Keppra   HLD:  continue on statin    Hx of COPD: w/o exacerbation.  Continue on bronchodilators    DM2: well controlled HbA1c 5.2.  --d/c'ed BG checks    HTN:  --Toprol XL resumed --Continue holding losartan    GERD:  continue on PPI    Depression: severity unknown.  Continue on home dose of sertraline     Overweight: BMI  28.2.  Wt loss efforts recommended   Right foot pain - see peripheral neuropathy     DVT prophylaxis: Heparin SQ Code Status: Full code    Family Communication: Updated daughter/POA, [name], and other present family members at bedside on rounds today     Consultants - Nephrology, Infectious Disease, Palliative Care   Level of care: Telemetry Medical   Dispo:   The patient is from: home Anticipated d/c is to: SNF  Anticipated d/c date is: TBD pending GOC decisions        Physical Exam:   General exam: awake alert, no acute distress  Respiratory system: on room air, normal respiratory effort. Cardiovascular system: RRR, no pedal edema Gastrointestinal system: soft, NT, ND  Central nervous system: A&O x3. no gross focal neurologic deficits Extremities:  LUE fistula noted, no edema, normal tone Skin: dry, intact, normal temperature Psychiatry: Good mood, congruent affect, judgment and insight intact    Vitals:   11/02/22 1100 11/02/22 1130 11/02/22 1200 11/02/22 1230  BP: (!) 173/73 (!) 146/79 133/74 120/68  Pulse: 61 66 66 68  Resp: (!) 21 (!) 23 (!) 22 (!) 21  Temp:      TempSrc:      SpO2: 97% 98% 98% 97%  Weight:      Height:         Time spent: 50 minutes  Author: Loyce Dys, MD 11/02/2022 12:57 PM  For on call review [email or web address].

## 2022-11-02 NOTE — Progress Notes (Addendum)
Nutrition Follow-up  DOCUMENTATION CODES:   Not applicable  INTERVENTION:   -Continue liberalized diet of regular -Continue renal MVI daily -Continue 30 ml Prosource Plus BID, each supplement provides 100 kcals and 15 grams protein -Continue Nepro Shake po TID, each supplement provides 425 kcal and 19 grams protein   NUTRITION DIAGNOSIS:   Increased nutrient needs related to chronic illness (ESRD on HD) as evidenced by estimated needs.  Ongoing  GOAL:   Patient will meet greater than or equal to 90% of their needs  Progressing   MONITOR:   PO intake, Supplement acceptance  REASON FOR ASSESSMENT:   Malnutrition Screening Tool    ASSESSMENT:   Pt with PMH of ESRD on HD, HLD, COPD, DM2, HTN, depression, CVA who presents with back pain x 3 weeks PTA.  3/21- s/p CT aspiration of rt psoas muscle (2 ml purulent material removed)    Reviewed I/O's: -1.5 L x 24 hours and -1.8 L since 10/19/22   Pt out of room in HD suite at time of visit.   Pt was initially going to pursue hospice services, however, mental status is more intact and pt and family would like to continue to pursue HD. Plan to find a facility that will accept pt with HD.   Pt with improved oral intake. Noted meal completions 50%. Pt is accepting of supplements.   Reviewed wt hx; pt has experienced a 4.3% wt loss over the past week, which is significant for time frame. Suspect some wt loss may be related to fluid losses from HD.   Medications reviewed and include keppra and metamucil.   Labs reviewed: K: 3.4. Phos: 6.0.    NUTRITION - FOCUSED PHYSICAL EXAM:  Flowsheet Row Most Recent Value  Orbital Region No depletion  Upper Arm Region No depletion  Thoracic and Lumbar Region No depletion  Buccal Region No depletion  Temple Region Mild depletion  Clavicle Bone Region Mild depletion  Clavicle and Acromion Bone Region Mild depletion  Scapular Bone Region Mild depletion  Dorsal Hand No depletion   Patellar Region No depletion  Anterior Thigh Region No depletion  Posterior Calf Region No depletion  Edema (RD Assessment) Mild  Hair Reviewed  Eyes Reviewed  Mouth Reviewed  Skin Reviewed  Nails Reviewed       Diet Order:   Diet Order             Diet regular Room service appropriate? Yes; Fluid consistency: Thin  Diet effective now                   EDUCATION NEEDS:   No education needs have been identified at this time  Skin:  Skin Assessment: Skin Integrity Issues: Skin Integrity Issues:: Other (Comment) Other: IAD to lt buttocks  Last BM:  10/26/22 (type 6)  Height:   Ht Readings from Last 1 Encounters:  10/12/22 6' (1.829 m)    Weight:   Wt Readings from Last 1 Encounters:  11/02/22 90.5 kg    Ideal Body Weight:  72.7 kg  BMI:  Body mass index is 27.06 kg/m.  Estimated Nutritional Needs:   Kcal:  2150-2350  Protein:  105-120 grams  Fluid:  1000 ml + UOP    Levada Schilling, RD, LDN, CDCES Registered Dietitian II Certified Diabetes Care and Education Specialist Please refer to [facility] for RD and/or RD on-call/weekend/after hours pager

## 2022-11-02 NOTE — Progress Notes (Signed)
[facility]  ROUNDING NOTE   Subjective:   Cynthia Dean is a 72 y.o. 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 She presents to the ED with worsening lower extremity pain. Patient will be admitted for Back pain [M54.9] Bilateral leg pain [M79.604, M79.605]  Patient seen and evaluated during dialysis   HEMODIALYSIS FLOWSHEET:  Blood Flow Rate (mL/min): 350 mL/min Arterial Pressure (mmHg): -230 mmHg Venous Pressure (mmHg): 180 mmHg TMP (mmHg): 14 mmHg Ultrafiltration Rate (mL/min): 640 mL/min Dialysate Flow Rate (mL/min): 300 ml/min Dialysis Fluid Bolus: Normal Saline Bolus Amount (mL): 100 mL (uf still off)  Tolerating treatment well in bed No complaints at this time  Family has decided to continue with dialysis.    Objective:  Vital signs in last 24 hours:  Temp:  [97.3 F (36.3 C)-98.4 F (36.9 C)] 97.5 F (36.4 C) (04/10 1044) Pulse Rate:  [58-69] 69 (04/10 1300) Resp:  [14-23] 14 (04/10 1300) BP: (103-173)/(35-79) 129/76 (04/10 1300) SpO2:  [93 %-99 %] 99 % (04/10 1300) Weight:  [93.4 kg] 93.4 kg (04/10 1043)  Weight change:  Filed Weights   10/26/22 1151 10/26/22 1627 11/02/22 1043  Weight: 94.6 kg 93.4 kg 93.4 kg    Intake/Output: I/O last 3 completed shifts: In: 590 [P.O.:380; I.V.:10; IV Piggyback:200] Out: -    Intake/Output this shift:  No intake/output data recorded.  Physical Exam: General: NAD  Head: Normocephalic, atraumatic.  Moist oral mucosal membranes  Eyes: Anicteric  Lungs:  Clear to auscultation  Heart: Regular rate and rhythm  Abdomen:  Soft, nontender  Extremities: 1+ peripheral edema.  Neurologic: Alert and oriented to self  Skin: No lesions  Access: Left AVG    Basic Metabolic Panel: Recent Labs  Lab 10/28/22 0546 10/29/22 0814 10/30/22 0921 10/31/22 0450  11/02/22 1052  NA 139 138 138 138 141  K 3.4* 3.1* 4.0 3.6 3.4*  CL 101 95* 100 102 106  CO2 26 28 26 24  21*  GLUCOSE 188* 154* 161* 178* 174*  BUN 41* 26* 38* 45* 59*  CREATININE 8.55* 5.84* 7.37* 8.57* 10.74*  CALCIUM 10.7* 9.8 10.0 9.5 9.1  MG 1.9  --   --  1.7  --   PHOS  --   --   --   --  6.0*     Liver Function Tests: Recent Labs  Lab 11/02/22 1052  ALBUMIN 2.3*    No results for input(s): "LIPASE", "AMYLASE" in the last 168 hours. No results for input(s): "AMMONIA" in the last 168 hours.  CBC: Recent Labs  Lab 10/29/22 0814 10/30/22 0921 10/31/22 0450 10/31/22 1446 11/02/22 1052  WBC 11.8* 10.5 9.2 8.7 7.0  HGB 10.1* 9.8* 9.3* 9.8* 9.3*  HCT 33.4* 33.6* 31.7* 33.1* 32.3*  MCV 80.3 81.4 81.9 81.9 84.1  PLT 202 215 177 214 210     Cardiac Enzymes: No results for input(s): "CKTOTAL", "CKMB", "CKMBINDEX", "TROPONINI" in the last 168 hours.  BNP: Invalid input(s): "POCBNP"  CBG: Recent Labs  Lab 10/27/22 0758 10/28/22 2207 10/31/22 0829 10/31/22 1152 11/01/22 0734  GLUCAP 123* 174* 149* 154* 148*     Microbiology: Results for orders placed or performed during the hospital encounter of 10/12/22  Blood culture (routine x 2)     Status: None   Collection Time: 10/12/22  5:40 PM   Specimen: BLOOD  Result Value Ref Range Status   Specimen Description BLOOD  BLOOD RIGHT HAND [facility]  Final   Special Requests   Final    BOTTLES DRAWN AEROBIC AND ANAEROBIC Blood Culture results may not be optimal due to an inadequate volume of blood received in culture bottles   Culture   Final    NO GROWTH 5 DAYS Performed at [facility], [address] [place], [place] [address]    Report Status 10/17/2022 FINAL  Final  Blood culture (routine x 2)     Status: None   Collection Time: 10/12/22  6:56 PM   Specimen: BLOOD  Result Value Ref Range Status   Specimen Description BLOOD BLOOD RIGHT FOREARM  Final   Special Requests   Final    BOTTLES DRAWN  AEROBIC ONLY Blood Culture results may not be optimal due to an inadequate volume of blood received in culture bottles   Culture   Final    NO GROWTH 5 DAYS Performed at [facility], [address], [place], [place] [address]    Report Status 10/17/2022 FINAL  Final  Aerobic/Anaerobic Culture w Gram Stain (surgical/deep wound)     Status: None   Collection Time: 10/13/22 11:44 AM   Specimen: Abscess  Result Value Ref Range Status   Specimen Description   Final    ABSCESS Performed at [facility], [address] [place], [place] [address]    Special Requests   Final    MUSCLE ABSCESS Performed at [facility], [address] [place], [place] [address]    Gram Stain   Final    ABUNDANT WBC PRESENT, PREDOMINANTLY PMN NO ORGANISMS SEEN    Culture   Final    FEW STAPHYLOCOCCUS CAPITIS NO ANAEROBES ISOLATED Performed at [facility], [address], [place], [place] [address]    Report Status 10/18/2022 FINAL  Final   Organism ID, Bacteria STAPHYLOCOCCUS CAPITIS  Final      Susceptibility   Staphylococcus capitis - MIC*    CIPROFLOXACIN <=0.5 SENSITIVE Sensitive     ERYTHROMYCIN >=8 RESISTANT Resistant     GENTAMICIN <=0.5 SENSITIVE Sensitive     OXACILLIN <=0.25 SENSITIVE Sensitive     TETRACYCLINE <=1 SENSITIVE Sensitive     VANCOMYCIN 1 SENSITIVE Sensitive     TRIMETH/SULFA <=10 SENSITIVE Sensitive     CLINDAMYCIN INTERMEDIATE Intermediate     RIFAMPIN <=0.5 SENSITIVE Sensitive     Inducible Clindamycin NEGATIVE Sensitive     * FEW STAPHYLOCOCCUS CAPITIS    Coagulation Studies: No results for input(s): "LABPROT", "INR" in the last 72 hours.  Urinalysis: No results for input(s): "COLORURINE", "LABSPEC", "PHURINE", "GLUCOSEU", "HGBUR", "BILIRUBINUR", "KETONESUR", "PROTEINUR", "UROBILINOGEN", "NITRITE", "LEUKOCYTESUR" in the last 72 hours.  Invalid input(s): "APPERANCEUR"    Imaging: No results found.   Medications:     anticoagulant sodium citrate     [START ON 11/09/2022]  ceFAZolin (ANCEF) IV     [START ON 11/07/2022]  ceFAZolin (ANCEF) IV      ceFAZolin (ANCEF) IV 2 g (11/02/22 1322)   [START ON 11/04/2022]  ceFAZolin (ANCEF) IV     methocarbamol (ROBAXIN) IV 500 mg (10/25/22 2043)    (feeding supplement) PROSource Plus  30 mL Oral BID BM   Chlorhexidine Gluconate Cloth  6 each Topical Q0600   cinacalcet  30 mg Oral Q supper   feeding supplement (NEPRO CARB STEADY)  237 mL Oral TID BM   fentaNYL  1 patch Transdermal Q72H   gabapentin  100 mg Oral Daily   Gerhardt's  butt cream   Topical QID   levETIRAcetam  1,000 mg Oral Daily   levETIRAcetam  250 mg Oral Q M,W,F   metoprolol succinate  100 mg Oral Daily   nystatin  5 mL Oral QID   pantoprazole  20 mg Oral BID   psyllium  1 packet Oral Daily   QUEtiapine  50 mg Oral QHS   sertraline  25 mg Oral Daily   sodium chloride flush  10-40 mL Intracatheter Q12H   umeclidinium-vilanterol  1 puff Inhalation Daily   alteplase, anticoagulant sodium citrate, fentaNYL (SUBLIMAZE) injection, hydrALAZINE, lidocaine (PF), lidocaine-prilocaine, LORazepam, methocarbamol (ROBAXIN) IV, ondansetron, senna-docusate, sodium chloride flush  Assessment/ Plan:  Cynthia Dean is a 72 y.o.  female with end stage renal disease on hemodialysis, coronary artery disease, COPD with intermittent home O2, hypertension, hyperlipidemia, stroke, diabetes mellitus type II, and breast cancer who was admitted to [facility] on 10/12/2022 for Back pain [M54.9] Bilateral leg pain [M79.604, M79.605]  Patient was found to have vertebral osteomyelitis and psoas abscess. Cultures positive for staph capitis  [facility] MWF Left AVG 102.5kg  End-stage renal disease on hemodialysis. Resumed on MWF schedule.    Patient receiving dialysis today in bed. Per interdisciplinary  team, we will continue dialysis in the bed. Primary team will manage pain regimen. Informed all parties  that patient will need to  sit in recliner for outpatient dialysis prior to discharge. Next treatment scheduled for Friday.   2. Anemia of chronic kidney disease Normocytic Lab Results  Component Value Date   HGB 9.3 (L) 11/02/2022  Patient receives Mircera at outpatient clinic.  Will reduce to low dose EPO.   3. Secondary Hyperparathyroidism: with outpatient labs: PTH 180 (3/11), phosphorus 5.4, calcium 9.3   Lab Results  Component Value Date   PTH 131 (H) 03/14/2018   CALCIUM 9.1 11/02/2022   CAION 1.09 (L) 08/28/2022   PHOS 6.0 (H) 11/02/2022   -Continue Cinacalcet. -Will continue to monitor bone minerals during this admission.  4.  Hypotension on hemodialysis treatment. Holding home regimen of losartan, metoprolol and torsemide. Blood pressure 118/76 during dialysis.   5. Diabetes mellitus type II with chronic kidney disease/renal manifestations: insulin dependent. Sliding scale insulin to be managed by primary team.  6. Vertebral osteomyelitis, seen on CT lumber spine. Neurosurgery feels patient is not a surgical candidate CT guided aspiration of psoas abscess on 10/13/22. Cultures positive for Staph Capitis.   - Continue Cefazolin with hemodialysis for 8 weeks. Appreciate Infectious Disease input.    LOS: 21   4/10/20241:42 PM

## 2022-11-02 NOTE — Progress Notes (Signed)
Patient alert and oriented x2, complained of back pain, PRN IV fenranyl given with improvement. Pain in patients back increases while in an upright position per pt.

## 2022-11-02 NOTE — Progress Notes (Signed)
OT Cancellation Note  Patient Details Name: Cynthia Dean MRN: [id number] DOB: September 29, 1950   Cancelled Treatment:    Reason Eval/Treat Not Completed: Patient at procedure or test/ unavailable. Chart reviewed, pt off the unit for HD. Will continue to follow POC as pt available.   Kathie Dike, M.S. OTR/L  11/02/22, 1:59 PM  ascom [phone]

## 2022-11-02 NOTE — Progress Notes (Signed)
PT Cancellation Note  Patient Details Name: Cynthia Dean MRN: [id number] DOB: 1951-05-13   Cancelled Treatment:     Pt off floor at dialysis. Will re-attempt next available date/time per POC.   Jannet Askew 11/02/2022, 2:16 PM

## 2022-11-02 NOTE — Progress Notes (Signed)
[facility])  Plan was for patient to be evaluated for Hospice Home transfer. MSW spoke to HCPOA/Kimberly and services declined at this time. [facility] to sign off but are available if needs arise.   Patient is not an active patient with [facility] services. Please call with any questions/concerns.    Thank you for the opportunity to participate in this patient's care.   Eugenie Birks, MSW [facility] Hospital Liaison  [phone]

## 2022-11-02 NOTE — Progress Notes (Signed)
  Received patient in bed to unit.   Informed consent signed and in chart.    TX duration: 3.25     Transported back to floor  Hand-off given to patient's nurse. No c/o and no distress noted    Access used: LAVG Access issues: none   Total UF removed: 1.5kg Medication(s) given: Ancef 2gm Post HD VS: stable Post HD weight: 90.5kg     Lynann Beaver  K[facility] Dialysis Unit

## 2022-11-03 DIAGNOSIS — M4646 Discitis, unspecified, lumbar region: Secondary | ICD-10-CM | POA: Diagnosis not present

## 2022-11-03 DIAGNOSIS — Z7189 Other specified counseling: Secondary | ICD-10-CM | POA: Diagnosis not present

## 2022-11-03 LAB — BASIC METABOLIC PANEL
Anion gap: 12 (ref 5–15)
BUN: 43 mg/dL — ABNORMAL HIGH (ref 8–23)
CO2: 26 mmol/L (ref 22–32)
Calcium: 9.6 mg/dL (ref 8.9–10.3)
Chloride: 102 mmol/L (ref 98–111)
Creatinine, Ser: 8.23 mg/dL — ABNORMAL HIGH (ref 0.44–1.00)
GFR, Estimated: 5 mL/min — ABNORMAL LOW (ref >60)
Glucose, Bld: 167 mg/dL — ABNORMAL HIGH (ref 70–99)
Potassium: 3.5 mmol/L (ref 3.5–5.1)
Sodium: 140 mmol/L (ref 135–145)

## 2022-11-03 LAB — CBC WITH DIFFERENTIAL/PLATELET
Abs Immature Granulocytes: 0.06 10*3/uL (ref 0.00–0.07)
Basophils Absolute: 0.1 10*3/uL (ref 0.0–0.1)
Basophils Relative: 1 %
Eosinophils Absolute: 0.2 10*3/uL (ref 0.0–0.5)
Eosinophils Relative: 3 %
HCT: 35 % — ABNORMAL LOW (ref 36.0–46.0)
Hemoglobin: 10.3 g/dL — ABNORMAL LOW (ref 12.0–15.0)
Immature Granulocytes: 1 %
Lymphocytes Relative: 29 %
Lymphs Abs: 2.3 10*3/uL (ref 0.7–4.0)
MCH: 24 pg — ABNORMAL LOW (ref 26.0–34.0)
MCHC: 29.4 g/dL — ABNORMAL LOW (ref 30.0–36.0)
MCV: 81.4 fL (ref 80.0–100.0)
Monocytes Absolute: 0.9 10*3/uL (ref 0.1–1.0)
Monocytes Relative: 11 %
Neutro Abs: 4.6 10*3/uL (ref 1.7–7.7)
Neutrophils Relative %: 55 %
Platelets: 204 10*3/uL (ref 150–400)
RBC: 4.3 MIL/uL (ref 3.87–5.11)
RDW: 21.8 % — ABNORMAL HIGH (ref 11.5–15.5)
Smear Review: NORMAL
WBC: 8.1 10*3/uL (ref 4.0–10.5)
nRBC: 0 % (ref 0.0–0.2)

## 2022-11-03 NOTE — Progress Notes (Signed)
Daily Progress Note   Patient Name: Cynthia Dean       Date: 11/03/2022 DOB: 1951/04/14  Age: 72 y.o. MRN#: [id number] Attending Physician: Loyce Dys, MD Primary Care Physician: Loistine Chance, MD Admit Date: 10/12/2022  Reason for Consultation/Follow-up: Establishing goals of care  Subjective: Notes and labs reviewed.  In to see patient.  She is resting in bed and complaining of pain in her buttock.  Today she tells me that she would like to continue dialysis if this can be done with her pain managed.  Discussed requesting pain medication as needed for pain.  She confirms DNR/DNI status, and states she is just looking to continue dialysis.  Called to speak to daughter.  Daughter discusses her conversation with PCP in depth, and recommendations made by PCP.  She discusses concerns with her current care.  We discussed various scenarios and care needs.  If it is possible, daughter would be amenable to switching patient to PD, and bring her in her home with home health to follow there.  Care team including nephrology made aware.  I completed a MOST form today and the signed original was placed in the chart. The form was scanned and sent to medical records for it to be uploaded under ACP tab in Epic. A photocopy was also placed in the chart to be scanned into EMR. The patient outlined their wishes for the following treatment decisions:  Cardiopulmonary Resuscitation: Do Not Attempt Resuscitation (DNR/No CPR)  Medical Interventions: Limited Additional Interventions: Use medical treatment, IV fluids and cardiac monitoring as indicated, DO NOT USE intubation or mechanical ventilation. May consider use of less invasive airway support such as BiPAP or CPAP. Also provide comfort measures.  Transfer to the hospital if indicated. Avoid intensive care.   Antibiotics: Antibiotics if indicated  IV Fluids: IV fluids for a defined trial period  Feeding Tube: No feeding tube       Length of Stay: 22  Current Medications: Scheduled Meds:   (feeding supplement) PROSource Plus  30 mL Oral BID BM   Chlorhexidine Gluconate Cloth  6 each Topical Q0600   cinacalcet  30 mg Oral Q supper   feeding supplement (NEPRO CARB STEADY)  237 mL Oral TID BM   fentaNYL  1 patch Transdermal Q72H   gabapentin  100 mg  Oral Daily   Gerhardt's butt cream   Topical QID   levETIRAcetam  1,000 mg Oral Daily   levETIRAcetam  250 mg Oral Q M,W,F   metoprolol succinate  100 mg Oral Daily   nystatin  5 mL Oral QID   pantoprazole  20 mg Oral BID   psyllium  1 packet Oral Daily   QUEtiapine  50 mg Oral QHS   sertraline  25 mg Oral Daily   sodium chloride flush  10-40 mL Intracatheter Q12H   umeclidinium-vilanterol  1 puff Inhalation Daily    Continuous Infusions:  anticoagulant sodium citrate     [START ON 11/07/2022]  ceFAZolin (ANCEF) IV      ceFAZolin (ANCEF) IV Stopped (11/02/22 1430)   [START ON 11/04/2022]  ceFAZolin (ANCEF) IV     methocarbamol (ROBAXIN) IV 500 mg (10/25/22 2043)    PRN Meds: alteplase, anticoagulant sodium citrate, fentaNYL (SUBLIMAZE) injection, hydrALAZINE, lidocaine (PF), lidocaine-prilocaine, LORazepam, methocarbamol (ROBAXIN) IV, ondansetron, senna-docusate, sodium chloride flush  Physical Exam Pulmonary:     Effort: Pulmonary effort is normal.  Neurological:     Mental Status: She is alert.             Vital Signs: BP (!) 159/67 (BP Location: Right Arm)   Pulse 76   Temp 98.5 F (36.9 C)   Resp 18   Ht 6' (1.829 m)   Wt 90.5 kg   SpO2 95%   BMI 27.06 kg/m  SpO2: SpO2: 95 % O2 Device: O2 Device: Room Air O2 Flow Rate: O2 Flow Rate (L/min): 3 L/min  Intake/output summary:  Intake/Output Summary (Last 24 hours) at 11/03/2022 1237 Last data filed at  11/03/2022 1043 Gross per 24 hour  Intake 220 ml  Output 1500 ml  Net -1280 ml   LBM: Last BM Date : 11/02/22 Baseline Weight: Weight: 113.4 kg Most recent weight: Weight: 90.5 kg        Patient Active Problem List   Diagnosis Date Noted   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/17/2022   Back pain 10/12/2022   Discitis of lumbar region 10/12/2022   Myositis 10/12/2022   Hemoglobin drop 08/28/2022   Chronic respiratory failure with hypoxia 08/26/2022   Acute on chronic blood loss anemia 08/26/2022   History of lower GI bleeding 08/26/2022   Insulin-requiring or dependent type II diabetes mellitus 08/26/2022   Chronic anticoagulation 08/26/2022   History of pulmonary embolism 03/20/2022 08/26/2022   Breast cancer, left S/P partial mastectomy,03/04/2022 08/26/2022   Generalized weakness    Unable to ambulate 05/09/2022   ESRD on dialysis    Bilateral leg pain    Depression    Pulmonary emboli    Obesity with body mass index (BMI) of 30.0 to 39.9    Anemia    Chronic diastolic CHF (congestive heart failure)    Seroma of breast    Bacteremia due to coagulase-negative Staphylococcus    Left breast abscess    Urinary tract infection 03/22/2022   Anemia of chronic disease 03/22/2022   Acute pulmonary embolism without acute cor pulmonale 03/20/2022   Dyslipidemia 03/20/2022   Diabetic neuropathy 03/20/2022   Sepsis due to cellulitis 03/20/2022   Delay kidney tx func d/t fluid overload requiring acute dialysis 01/29/2020   Delay kidney tx func d/t ATN and fluid overload require acute dialysis 01/29/2020   Hypoglycemia 10/21/2019   Unresponsiveness 10/21/2019   GERD (gastroesophageal reflux disease) 10/21/2019   Hypothermia    Sepsis with encephalopathy without septic shock    Diarrhea  Acute metabolic encephalopathy    Acute on chronic diastolic CHF (congestive heart failure)    Acute on chronic respiratory failure with hypoxia 07/30/2019   Rectal bleed 02/13/2019   Acute respiratory failure  with hypoxia 12/31/2018   History of CVA (cerebrovascular accident) 05/29/2018   Aphasia as late effect of cerebrovascular accident (CVA) 05/01/2018   Seizure disorder 03/14/2018   Stroke 03/07/2018   Slurred speech 11/14/2017   Type 2 diabetes mellitus with ESRD (end-stage renal disease) 11/12/2017   CAD (coronary artery disease) 11/12/2017   COPD (chronic obstructive pulmonary disease) 11/12/2017   ESRD on hemodialysis 11/12/2017   CVA (cerebral vascular accident) 04/14/2016   Essential hypertension 11/27/2015   Hyperlipidemia 11/27/2015   Prolonged Q-T interval on ECG 11/26/2015   Aphasia complicating stroke 11/05/2015   Cervical spine arthritis 07/27/2015   Diabetic retinopathy without macular edema associated with type 2 diabetes mellitus 07/27/2015   Osteoarthritis of spine with radiculopathy, lumbar region 07/27/2015   Obesity, Class III, BMI 40-49.9 (morbid obesity) 07/27/2015   Vitamin D deficiency 01/21/2011   Diastolic heart failure 10/27/2010    Palliative Care Assessment & Plan    Recommendations/Plan:  Agree with fentanyl patch at this time.  Have encouraged patient to use IV fentanyl as needed to help determine symptom management needs.  Daughter voices interest in PD if patient would be a candidate for this.  Nephrology made aware.  New MOST form completed, patient remains DNR/DNI.     Code Status:    Code Status Orders  (From admission, onward)           Start     Ordered   11/01/22 1535  Do not attempt resuscitation (DNR)  Continuous       Question Answer Comment  If patient has no pulse and is not breathing Do Not Attempt Resuscitation   If patient has a pulse and/or is breathing: Medical Treatment Goals COMFORT MEASURES: Keep clean/warm/dry, use medication by any route; positioning, wound care and other measures to relieve pain/suffering; use oxygen, suction/manual treatment of airway obstruction for comfort; do not transfer unless for comfort  needs.   Consent: Discussion documented in EHR or advanced directives reviewed      11/01/22 1534           Code Status History     Date Active Date Inactive Code Status Order ID Comments User Context   10/12/2022 1101 11/01/2022 1534 Full Code [id number]  Charise Killian, MD Inpatient   08/26/2022 2018 09/09/2022 2244 Full Code [id number]  Andris Baumann, MD ED   05/09/2022 0919 05/17/2022 0109 Full Code [id number]  Lorretta Harp, MD ED   03/20/2022 0121 04/02/2022 0126 Full Code [id number]  Mansy, Vernetta Honey, MD ED   01/29/2020 2038 01/30/2020 2215 Full Code [id number]  Lucile Shutters, MD Inpatient   01/29/2020 1210 01/29/2020 2038 Full Code [id number]  Lucile Shutters, MD Inpatient   10/21/2019 0902 10/25/2019 2211 Full Code [id number]  Lorretta Harp, MD ED   07/30/2019 2224 08/11/2019 2130 Full Code [id number]  Mansy, Vernetta Honey, MD ED   02/13/2019 1329 02/16/2019 1849 Full Code [id number]  Jimmye Norman, NP ED   12/31/2018 1159 01/02/2019 1330 Full Code [id number]  Jimmye Norman, NP ED   05/29/2018 1541 05/30/2018 2041 Full Code [id number]  Alford Highland, MD ED   03/14/2018 0141 03/15/2018 0336 Full Code [id number]  Cammy Copa, MD Inpatient   03/08/2018 0005 03/09/2018 2108 Full Code [id number]  Altamese Dilling, MD Inpatient  11/15/2017 0017 11/17/2017 1809 Full Code [id number]  Oralia Manis, MD ED   11/12/2017 2219 11/13/2017 1935 Full Code [id number]  Oralia Manis, MD ED   04/14/2016 1330 04/14/2016 1700 Full Code [id number]  Adrian Saran, MD ED   08/08/2015 2257 08/09/2015 2054 Full Code [id number]  Wyatt Haste, MD ED   04/17/2015 1334 04/17/2015 1836 Full Code [id number]  Dew, Marlow Baars, MD Inpatient      Advance Directive Documentation    Flowsheet Row Most Recent Value  Type of Advance Directive Healthcare Power of Attorney  Pre-existing out of facility DNR order (yellow form or pink MOST form) --  "MOST" Form in Place? --       Prognosis: Poor overall    Care plan was discussed with  team  Thank you for allowing the Palliative Medicine Team to assist in the care of this patient.    Morton Stall, NP  Please contact Palliative Medicine Team phone at [phone] for questions and concerns.

## 2022-11-03 NOTE — Progress Notes (Signed)
Occupational Therapy Re-Evaluation Patient Details Name: Cynthia Dean MRN: [id number] DOB: [birth year] Today's Date: 11/03/2022   History of present illness Cynthia Dean is a 72 y.o. 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 She presents to the ED with worsening lower extremity pain.   OT comments  Cynthia Dean was seen for OT/PT co-treatment on this date. Upon arrival to room pt supine in bed, agreeable to tx. Pt requires MAX A x2 sup<>sit and standing trial, clears rear. MAX A don B socks seated. Fluctuating from CGA - MOD A dynamic sitting balance. Pt emotionally labile and focused on avoiding pain. Goals updated, will continue to follow POC. Discharge recommendation remains appropriate.     Recommendations for follow up therapy are one component of a multi-disciplinary discharge planning process, led by the attending physician.  Recommendations may be updated based on patient status, additional functional criteria and insurance authorization.    Assistance Recommended at Discharge Frequent or constant Supervision/Assistance  Patient can return home with the following  Two people to help with walking and/or transfers;Help with stairs or ramp for entrance;A lot of help with bathing/dressing/bathroom;Assistance with cooking/housework;Assist for transportation;Direct supervision/assist for financial management;Direct supervision/assist for medications management   Equipment Recommendations  None recommended by OT    Recommendations for Other Services      Precautions / Restrictions Precautions Precautions: Fall Precaution Comments: L chest port; L AV fistula Restrictions Weight Bearing Restrictions: No       Mobility Bed Mobility Overal bed mobility: Needs Assistance Bed Mobility: Supine to Sit, Sit to Supine     Supine to sit: Max  assist, +2 for physical assistance, HOB elevated Sit to supine: Max assist, +2 for physical assistance, HOB elevated   General bed mobility comments: assist for trunk and B LE's    Transfers Overall transfer level: Needs assistance Equipment used: 2 person hand held assist Transfers: Sit to/from Stand Sit to Stand: Max assist, +2 physical assistance           General transfer comment: max assist x2 to attempt standing (just clearing pt's bottom from bed)     Balance Overall balance assessment: Needs assistance Sitting-balance support: Bilateral upper extremity supported, Feet supported Sitting balance-Leahy Scale: Poor Sitting balance - Comments: pt fluctuating between CGA to mod assist for sitting balance                                   ADL either performed or assessed with clinical judgement   ADL Overall ADL's : Needs assistance/impaired                                       General ADL Comments: MAX A don B socks seated. Fluctuating from CGA - MOD A dynamic sitting balance    Extremity/Trunk Assessment Upper Extremity Assessment Upper Extremity Assessment: Generalized weakness   Lower Extremity Assessment Lower Extremity Assessment: Generalized weakness         Cognition Arousal/Alertness: Awake/alert Behavior During Therapy: Anxious, Impulsive Overall Cognitive Status: No family/caregiver present to determine baseline cognitive functioning Area of Impairment: Orientation                 Orientation Level: Disoriented to, Time, Situation  General Comments: emotionally labile                   Pertinent Vitals/ Pain       Pain Assessment Pain Assessment: Faces Faces Pain Scale: Hurts little more Pain Location: toes B feet and then c/o R LE pain Pain Descriptors / Indicators: Grimacing, Guarding, Crying Pain Intervention(s): Limited activity within patient's tolerance, Repositioned  Home  Living Family/patient expects to be discharged to:: Private residence Living Arrangements: Children Available Help at Discharge: Family;Available PRN/intermittently Type of Home: House Home Access: Stairs to enter [employer] of Steps: 3 Entrance Stairs-Rails: None Home Layout: One level     Bathroom Shower/Tub: Tub/shower unit;Curtain   Firefighter: Standard Bathroom Accessibility: Yes   Home Equipment: Agricultural consultant (2 wheels);Wheelchair - manual   Additional Comments: Above information per chart.      Prior Functioning/Environment              Frequency  Min 1X/week        Progress Toward Goals  OT Goals(current goals can now be found in the care plan section)  Progress towards OT goals: Progressing toward goals  Acute Rehab OT Goals Patient Stated Goal: to improve pain OT Goal Formulation: With patient Time For Goal Achievement: 11/17/22 Potential to Achieve Goals: Fair ADL Goals Pt Will Perform Grooming: with supervision;sitting Pt Will Perform Lower Body Dressing: with min assist;sitting/lateral leans Pt Will Transfer to Toilet: with max assist;stand pivot transfer;bedside commode  Plan Discharge plan remains appropriate;Frequency remains appropriate    Co-evaluation    PT/OT/SLP Co-Evaluation/Treatment: Yes Reason for Co-Treatment: Complexity of the patient's impairments (multi-system involvement);For patient/therapist safety;Necessary to address cognition/behavior during functional activity;To address functional/ADL transfers PT goals addressed during session: Mobility/safety with mobility OT goals addressed during session: ADL's and self-care      AM-PAC OT "6 Clicks" Daily Activity     Outcome Measure   Help from another person eating meals?: None Help from another person taking care of personal grooming?: A Lot Help from another person toileting, which includes using toliet, bedpan, or urinal?: A Lot Help from another person  bathing (including washing, rinsing, drying)?: A Lot Help from another person to put on and taking off regular upper body clothing?: A Little Help from another person to put on and taking off regular lower body clothing?: A Lot 6 Click Score: 15    End of Session    OT Visit Diagnosis: Other abnormalities of gait and mobility (R26.89);Muscle weakness (generalized) (M62.81)   Activity Tolerance Patient tolerated treatment well   Patient Left in bed;with call bell/phone within reach;with bed alarm set   Nurse Communication          Time: 6222-9798 OT Time Calculation (min): 17 min  Charges: OT General Charges $OT Visit: 1 Visit OT Evaluation $OT Re-eval: 1 Re-eval  Kathie Dike, M.S. OTR/L  11/03/22, 12:56 PM  ascom [phone]

## 2022-11-03 NOTE — Progress Notes (Signed)
Progress Note   [name] YYQ:[id number] DOB: 01-09-1951 DOA: 10/12/2022     22 DOS: the patient was seen and examined on 11/03/2022   Brief hospital course: Subjective:  Patient seen and examined at bedside this morning Back pain improving Denied nausea vomiting chest pain or cough   Brief hospital course:   Cynthia Dean is a 72 y/o F w/ PMH of ESRD on HD, COPD, DM2, HTN, depression, CVA who presented w/ back pain x 3 weeks.  Pt presented w/ back pain and was found to intradiscal abscess/lumbar discitis/b/l psoas abscess on MRI status post IR guided drainage. Patient has had waxing and waning mental status with intermittently refusing dialysis. Patient's family have decided to continue hemodialysis after speaking with their PCP.     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  Wound cx growing staph capitis.  ID consulted.  Abx de-escalated to cefazolin.  Not a good surgical candidate as per neuro surg.   Pt having severe low back and bilateral leg pain --cont cefazolin, for 6 weeks, End Date: 12/02/22 --pain management -- trial Norco, given ESRD will use IV fentanyl PRN and before dialysis  Fentanyl patch have been added to improve patient's back pain   Confusion and agitation Hospital delirium --Pt was agitated, and also not responding to questions or commands during early hospitalization.  Mental status did start to improve, and now pt is calm and interactive, however, at this time she has been having waxing and waning mental status --cont seroquel 50 mg nightly (new) to help with delirium and sleep.     ESRD: on HD MWF.  Pt refused HD on 10/17/22, 10/18/22 and 3/27, 4/1.   Since then, has been agreeable to dialysis without further issues, getting dialysis  in hospital bed. When attempts made to do HD in chair, she did not tolerate on account of severe back pain Fentanyl patch have been added to improve patient's back pain   Generalized weakness:  PT recs SNF. TOC following.   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 H&H are stable    Hypokalemia:-Improved --monitor and replete PRN    Peripheral neuropathy --  4/4 - Cautious trial of gabapentin 100 mg daily (ESRD - monitor closely for side effects) 4% - Pt notes significant improvement and no side effects so far --Continue low dose gabapentin and monitor   Hx of seizures:  continue on home dose of Keppra   HLD:  continue on statin    Hx of COPD: w/o exacerbation.  Continue on bronchodilators    DM2: well controlled HbA1c 5.2.  --d/c'ed BG checks    HTN:  --Toprol XL resumed --Continue holding losartan    GERD:  continue on PPI    Depression: severity unknown.  Continue on home dose of sertraline     Overweight: BMI 28.2.  Wt loss efforts recommended   Right foot pain - see peripheral neuropathy     DVT prophylaxis: Heparin SQ Code Status: Full code    Family Communication: Updated daughter/POA, [name], and other present family members at bedside on rounds today     Data Reviewed: I have reviewed patient's laboratory results showing sodium 140 potassium 3.5 and creatinine 10.2   Disposition: Status is: Inpatient Patient still meets criteria for inpatient need given  IV antibiotics As well as unsafe discharge without dialysis  Time spent: 40 minutes Consultants - Nephrology, Infectious Disease, Palliative Care   Level of care: Telemetry Medical      Physical Exam:   General exam: awake alert, no acute distress  Respiratory system: on room air, normal respiratory effort. Cardiovascular system: RRR, no pedal edema Gastrointestinal system: soft, NT, ND  Central nervous system: A&O x3. no gross focal neurologic deficits Extremities:  LUE fistula noted, no edema,  normal tone Skin: dry, intact, normal temperature Psychiatry: Good mood, congruent affect, judgment and insight intact           Physical Exam: Vitals:   11/02/22 1509 11/02/22 2035 11/03/22 0540 11/03/22 0919  BP: 118/87 126/68 (!) 155/62 (!) 159/67  Pulse: 71 77 71 76  Resp: 16 18 16 18   Temp: 98.2 F (36.8 C) 98.1 F (36.7 C) 97.7 F (36.5 C) 98.5 F (36.9 C)  TempSrc:  Oral Oral   SpO2: 100% 95% 97% 95%  Weight:      Height:         Author: Loyce Dys, MD 11/03/2022 2:44 PM  For on call review [email or web address].

## 2022-11-03 NOTE — Progress Notes (Addendum)
[facility]  ROUNDING NOTE   Subjective:   Cynthia Dean is a 72 y.o. 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 She presents to the ED with worsening lower extremity pain. Patient will be admitted for Back pain [M54.9] Bilateral leg pain [M79.604, M79.605]  Patient seen resting in bed, no family at bedside Alert and oriented to self, place, and situation Patient states pain managed at this time Currently awaiting breakfast Dialysis tolerated well yesterday  Objective:  Vital signs in last 24 hours:  Temp:  [97.5 F (36.4 C)-98.5 F (36.9 C)] 98.5 F (36.9 C) (04/11 0919) Pulse Rate:  [58-77] 76 (04/11 0919) Resp:  [14-23] 18 (04/11 0919) BP: (106-173)/(61-87) 159/67 (04/11 0919) SpO2:  [95 %-100 %] 95 % (04/11 0919) Weight:  [90.5 kg-93.4 kg] 90.5 kg (04/10 1457)  Weight change:  Filed Weights   11/02/22 1043 11/02/22 1457  Weight: 93.4 kg 90.5 kg    Intake/Output: I/O last 3 completed shifts: In: 220 [P.O.:220] Out: 1500 [Other:1500]   Intake/Output this shift:  No intake/output data recorded.  Physical Exam: General: NAD  Head: Normocephalic, atraumatic.  Moist oral mucosal membranes  Eyes: Anicteric  Lungs:  Clear to auscultation  Heart: Regular rate and rhythm  Abdomen:  Soft, nontender  Extremities: Trace peripheral edema.  Neurologic: Alert and oriented x 3, moving all 4 extremities  Skin: No lesions  Access: Left AVG    Basic Metabolic Panel: Recent Labs  Lab 10/28/22 0546 10/29/22 0814 10/30/22 0921 10/31/22 0450 11/02/22 1052 11/03/22 0514  NA 139 138 138 138 141 140  K 3.4* 3.1* 4.0 3.6 3.4* 3.5  CL 101 95* 100 102 106 102  CO2 26 28 26 24  21* 26  GLUCOSE 188* 154* 161* 178* 174* 167*  BUN 41* 26* 38* 45* 59* 43*  CREATININE 8.55* 5.84* 7.37* 8.57* 10.74* 8.23*  CALCIUM 10.7* 9.8 10.0  9.5 9.1 9.6  MG 1.9  --   --  1.7  --   --   PHOS  --   --   --   --  6.0*  --      Liver Function Tests: Recent Labs  Lab 11/02/22 1052  ALBUMIN 2.3*    No results for input(s): "LIPASE", "AMYLASE" in the last 168 hours. No results for input(s): "AMMONIA" in the last 168 hours.  CBC: Recent Labs  Lab 10/30/22 0921 10/31/22 0450 10/31/22 1446 11/02/22 1052 11/03/22 0514  WBC 10.5 9.2 8.7 7.0 8.1  NEUTROABS  --   --   --   --  4.6  HGB 9.8* 9.3* 9.8* 9.3* 10.3*  HCT 33.6* 31.7* 33.1* 32.3* 35.0*  MCV 81.4 81.9 81.9 84.1 81.4  PLT 215 177 214 210 204     Cardiac Enzymes: No results for input(s): "CKTOTAL", "CKMB", "CKMBINDEX", "TROPONINI" in the last 168 hours.  BNP: Invalid input(s): "POCBNP"  CBG: Recent Labs  Lab 10/28/22 2207 10/31/22 0829 10/31/22 1152 11/01/22 0734  GLUCAP 174* 149* 154* 148*     Microbiology: Results for orders placed or performed during the hospital encounter of 10/12/22  Blood culture (routine x 2)     Status: None   Collection Time: 10/12/22  5:40 PM   Specimen: BLOOD  Result Value Ref Range Status   Specimen Description BLOOD BLOOD RIGHT HAND [facility]  Final   Special Requests   Final    BOTTLES DRAWN AEROBIC  AND ANAEROBIC Blood Culture results may not be optimal due to an inadequate volume of blood received in culture bottles   Culture   Final    NO GROWTH 5 DAYS Performed at [facility], [address] [place], [place] [address]    Report Status 10/17/2022 FINAL  Final  Blood culture (routine x 2)     Status: None   Collection Time: 10/12/22  6:56 PM   Specimen: BLOOD  Result Value Ref Range Status   Specimen Description BLOOD BLOOD RIGHT FOREARM  Final   Special Requests   Final    BOTTLES DRAWN AEROBIC ONLY Blood Culture results may not be optimal due to an inadequate volume of blood received in culture bottles   Culture   Final    NO GROWTH 5 DAYS Performed at [facility], [address]  [place], [place] [address]    Report Status 10/17/2022 FINAL  Final  Aerobic/Anaerobic Culture w Gram Stain (surgical/deep wound)     Status: None   Collection Time: 10/13/22 11:44 AM   Specimen: Abscess  Result Value Ref Range Status   Specimen Description   Final    ABSCESS Performed at [facility], [address] [place], [place] [address]    Special Requests   Final    MUSCLE ABSCESS Performed at [facility], [address] [place], [place] [address]    Gram Stain   Final    ABUNDANT WBC PRESENT, PREDOMINANTLY PMN NO ORGANISMS SEEN    Culture   Final    FEW STAPHYLOCOCCUS CAPITIS NO ANAEROBES ISOLATED Performed at [facility], [address] [place], [place] [address]    Report Status 10/18/2022 FINAL  Final   Organism ID, Bacteria STAPHYLOCOCCUS CAPITIS  Final      Susceptibility   Staphylococcus capitis - MIC*    CIPROFLOXACIN <=0.5 SENSITIVE Sensitive     ERYTHROMYCIN >=8 RESISTANT Resistant     GENTAMICIN <=0.5 SENSITIVE Sensitive     OXACILLIN <=0.25 SENSITIVE Sensitive     TETRACYCLINE <=1 SENSITIVE Sensitive     VANCOMYCIN 1 SENSITIVE Sensitive     TRIMETH/SULFA <=10 SENSITIVE Sensitive     CLINDAMYCIN INTERMEDIATE Intermediate     RIFAMPIN <=0.5 SENSITIVE Sensitive     Inducible Clindamycin NEGATIVE Sensitive     * FEW STAPHYLOCOCCUS CAPITIS    Coagulation Studies: No results for input(s): "LABPROT", "INR" in the last 72 hours.  Urinalysis: No results for input(s): "COLORURINE", "LABSPEC", "PHURINE", "GLUCOSEU", "HGBUR", "BILIRUBINUR", "KETONESUR", "PROTEINUR", "UROBILINOGEN", "NITRITE", "LEUKOCYTESUR" in the last 72 hours.  Invalid input(s): "APPERANCEUR"    Imaging: No results found.   Medications:    anticoagulant sodium citrate     [START ON 11/09/2022]  ceFAZolin (ANCEF) IV     [START ON 11/07/2022]  ceFAZolin (ANCEF) IV      ceFAZolin (ANCEF) IV Stopped (11/02/22 1430)   [START ON 11/04/2022]  ceFAZolin (ANCEF) IV      methocarbamol (ROBAXIN) IV 500 mg (10/25/22 2043)    (feeding supplement) PROSource Plus  30 mL Oral BID BM   Chlorhexidine Gluconate Cloth  6 each Topical Q0600   cinacalcet  30 mg Oral Q supper   feeding supplement (NEPRO CARB STEADY)  237 mL Oral TID BM   fentaNYL  1 patch Transdermal Q72H   gabapentin  100 mg Oral Daily   Gerhardt's butt cream   Topical QID   levETIRAcetam  1,000 mg Oral Daily   levETIRAcetam  250 mg  Oral Q M,W,F   metoprolol succinate  100 mg Oral Daily   nystatin  5 mL Oral QID   pantoprazole  20 mg Oral BID   psyllium  1 packet Oral Daily   QUEtiapine  50 mg Oral QHS   sertraline  25 mg Oral Daily   sodium chloride flush  10-40 mL Intracatheter Q12H   umeclidinium-vilanterol  1 puff Inhalation Daily   alteplase, anticoagulant sodium citrate, fentaNYL (SUBLIMAZE) injection, hydrALAZINE, lidocaine (PF), lidocaine-prilocaine, LORazepam, methocarbamol (ROBAXIN) IV, ondansetron, senna-docusate, sodium chloride flush  Assessment/ Plan:  Ms. Kaylyn Milkey is a 72 y.o.  female with end stage renal disease on hemodialysis, coronary artery disease, COPD with intermittent home O2, hypertension, hyperlipidemia, stroke, diabetes mellitus type II, and breast cancer who was admitted to [facility] on 10/12/2022 for Back pain [M54.9] Bilateral leg pain [M79.604, M79.605]  Patient was found to have vertebral osteomyelitis and psoas abscess. Cultures positive for staph capitis  [facility] MWF Left AVG 102.5kg  End-stage renal disease on hemodialysis. Resumed on MWF schedule.    Patient received dialysis yesterday, UF 1.5 L achieved.  Will perform next dialysis treatment on Friday with patient in bed.  Will assess effectiveness of recent pain control measures this weekend.  Will attempt dialysis in a recliner next week.    Recommended team place patient in chair for short periods of time to prepare for dialysis.   Will order updated Hep B antigen and Hep B  total core. Will also order chest xray. Patient will be up for outpatient dialysis discharge in 8-9 days and these test will be needed for readmission, if needed.   2. Anemia of chronic kidney disease Normocytic Lab Results  Component Value Date   HGB 10.3 (L) 11/03/2022  Patient receives Mircera at outpatient clinic.  Hemoglobin within optimal range.  Will continue low-dose EPO.  3. Secondary Hyperparathyroidism: with outpatient labs: PTH 180 (3/11), phosphorus 5.4, calcium 9.3   Lab Results  Component Value Date   PTH 131 (H) 03/14/2018   CALCIUM 9.6 11/03/2022   CAION 1.09 (L) 08/28/2022   PHOS 6.0 (H) 11/02/2022   -Continue Cinacalcet. -Calcium acceptable with slightly elevated phosphorus.  Will continue to monitor for now.  4.  Hypotension on hemodialysis treatment. Holding home regimen of losartan, metoprolol and torsemide. Blood pressure elevated from baseline today, 155/62.  5. Diabetes mellitus type II with chronic kidney disease/renal manifestations: insulin dependent.  Glucose well-controlled.  Sliding scale insulin to be managed by primary team.  6. Vertebral osteomyelitis, seen on CT lumber spine. Neurosurgery feels patient is not a surgical candidate CT guided aspiration of psoas abscess on 10/13/22. Cultures positive for Staph Capitis.   - Cefazolin with hemodialysis for 8 weeks.  Per infectious Disease recommendations.    LOS: 22   4/11/202410:13 AM

## 2022-11-03 NOTE — Evaluation (Signed)
Physical Therapy Re-Evaluation Patient Details Name: Cynthia Dean MRN: [id number] DOB: 02/03/1951 Today's Date: 11/03/2022  History of Present Illness  Cynthia Dean is a 72 y.o. 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 She presents to the ED with worsening lower extremity pain.  Clinical Impression  PT/OT co-session performed.  Pt appearing calm resting in bed beginning/end of session but became emotional sitting on edge of bed.  Pt reporting no pain laying in bed beginning/end of session but did c/o pain in B toes when initially sitting up and eventually c/o pain in R LE (pain resolved once laying down in bed).  R LE appearing weaker than L LE with movement but anticipate this related to pt's pain in R LE.  During session pt max assist x2 with bed mobility; CGA to mod assist for sitting balance; and max assist x2 to attempt standing from bed (pt's bottom just clearing bed--unable to stand fully upright).  Will continue to focus on strengthening, balance, and progressive functional mobility during hospitalization.  PT POC reviewed and updated.    Recommendations for follow up therapy are one component of a multi-disciplinary discharge planning process, led by the attending physician.  Recommendations may be updated based on patient status, additional functional criteria and insurance authorization.  Follow Up Recommendations Can patient physically be transported by private vehicle: No     Assistance Recommended at Discharge Frequent or constant Supervision/Assistance  Patient can return home with the following  Two people to help with walking and/or transfers;A lot of help with bathing/dressing/bathroom;Assistance with cooking/housework;Direct supervision/assist for medications management;Direct supervision/assist for financial management;Assist for  transportation;Help with stairs or ramp for entrance    Equipment Recommendations None recommended by PT (TBD at next facility)  Recommendations for Other Services       Functional Status Assessment Patient has had a recent decline in their functional status and demonstrates the ability to make significant improvements in function in a reasonable and predictable amount of time.     Precautions / Restrictions Precautions Precautions: Fall Precaution Comments: L chest port; L AV fistula Restrictions Weight Bearing Restrictions: No      Mobility  Bed Mobility Overal bed mobility: Needs Assistance Bed Mobility: Supine to Sit, Sit to Supine     Supine to sit: Max assist, +2 for physical assistance, HOB elevated Sit to supine: Max assist, +2 for physical assistance, HOB elevated   General bed mobility comments: assist for trunk and B LE's    Transfers Overall transfer level: Needs assistance Equipment used: 2 person hand held assist Transfers: Sit to/from Stand Sit to Stand: Max assist, +2 physical assistance           General transfer comment: max assist x2 to attempt standing (just clearing pt's bottom from bed)    Ambulation/Gait               General Gait Details: unable to stand to attempt  Stairs            Wheelchair Mobility    Modified Rankin (Stroke Patients Only)       Balance Overall balance assessment: Needs assistance Sitting-balance support: Bilateral upper extremity supported, Feet supported Sitting balance-Leahy Scale: Poor Sitting balance - Comments: pt fluctuating between CGA to mod assist for sitting balance (complicated d/t pt appearing emotional when sitting up on edge of bed)  Pertinent Vitals/Pain Pain Assessment Pain Assessment: Faces Pain Location: toes B feet and then c/o R LE pain Pain Descriptors / Indicators: Grimacing, Guarding, Crying Pain Intervention(s):  Limited activity within patient's tolerance, Monitored during session, Repositioned (nurse reports pt did not have any pain so did not give pt pain meds)    Home Living Family/patient expects to be discharged to:: Private residence Living Arrangements: Children Available Help at Discharge: Family;Available PRN/intermittently Type of Home: House Home Access: Stairs to enter Entrance Stairs-Rails: None Entrance Stairs-Number of Steps: 3   Home Layout: One level Home Equipment: Agricultural consultant (2 wheels);Wheelchair - manual Additional Comments: Above information per chart.    Prior Function Prior Level of Function : Patient poor historian/Family not available       Physical Assist : ADLs (physical);Mobility (physical)   ADLs (physical): Grooming;Bathing;Dressing;Toileting Mobility Comments: Per chart review "Able to amb short household distances with a RW at baseline but more recently she transferred from bed to wheelchair or bed to [facility]". ADLs Comments: Per chart pt previously required assist for all ADLs     Hand Dominance        Extremity/Trunk Assessment   Upper Extremity Assessment Upper Extremity Assessment: Defer to OT evaluation;Generalized weakness    Lower Extremity Assessment Lower Extremity Assessment: Generalized weakness (R LE appearing weaker than L LE but appeared to be limited d/t R LE pain)       Communication   Communication: No difficulties  Cognition Arousal/Alertness: Awake/alert Behavior During Therapy: Anxious, Impulsive Overall Cognitive Status: No family/caregiver present to determine baseline cognitive functioning                                 General Comments: Pt appearing emotional during session but calm beginning/end of session.  Oriented to person and place; pt reported it was July.        General Comments  Nursing cleared pt for participation in physical therapy.  Pt agreeable to therapy session.    Exercises      Assessment/Plan    PT Assessment Patient needs continued PT services  PT Problem List Decreased strength;Decreased activity tolerance;Decreased balance;Decreased mobility;Decreased cognition;Decreased knowledge of use of DME;Decreased safety awareness;Obesity;Decreased knowledge of precautions;Pain       PT Treatment Interventions DME instruction;Gait training;Stair training;Functional mobility training;Therapeutic activities;Therapeutic exercise;Balance training;Neuromuscular re-education;Patient/family education    PT Goals (Current goals can be found in the Care Plan section)  Acute Rehab PT Goals Patient Stated Goal: get better PT Goal Formulation: Patient unable to participate in goal setting Time For Goal Achievement: 11/17/22 Potential to Achieve Goals: Fair    Frequency Min 2X/week     Co-evaluation PT/OT/SLP Co-Evaluation/Treatment: Yes Reason for Co-Treatment: Complexity of the patient's impairments (multi-system involvement);For patient/therapist safety;Necessary to address cognition/behavior during functional activity;To address functional/ADL transfers PT goals addressed during session: Mobility/safety with mobility OT goals addressed during session: ADL's and self-care       AM-PAC PT "6 Clicks" Mobility  Outcome Measure Help needed turning from your back to your side while in a flat bed without using bedrails?: A Lot Help needed moving from lying on your back to sitting on the side of a flat bed without using bedrails?: Total Help needed moving to and from a bed to a chair (including a wheelchair)?: Total Help needed standing up from a chair using your arms (e.g., wheelchair or bedside chair)?: Total Help needed to walk in hospital room?: Total Help  needed climbing 3-5 steps with a railing? : Total 6 Click Score: 7    End of Session Equipment Utilized During Treatment: Gait belt Activity Tolerance: Patient limited by pain;Other (comment) (pt very  emotional) Patient left: in bed;with call bell/phone within reach;with bed alarm set Nurse Communication: Mobility status;Precautions PT Visit Diagnosis: Unsteadiness on feet (R26.81);Other abnormalities of gait and mobility (R26.89);Muscle weakness (generalized) (M62.81);Difficulty in walking, not elsewhere classified (R26.2)    Time: 1024-1040 PT Time Calculation (min) (ACUTE ONLY): 16 min   Charges:   PT Evaluation $PT Re-evaluation: 1 Re-eval         Hendricks Limes, PT 11/03/22, 12:19 PM

## 2022-11-03 NOTE — Discharge Planning (Signed)
Continuing to follow for outpatient hemodialysis. Per care team: plan at the moment is to manage pain and move towards sitting in recliner for outpatient hemodialysis. Requested HepB total core, antigen and CXR to be ordered for reactivation at clinic. Patient will become inactive on 4/20, clinic stated they will continue to hold her chair even after deactivation, however patient will need to be referred back to clinic as a new start.

## 2022-11-04 DIAGNOSIS — M4646 Discitis, unspecified, lumbar region: Secondary | ICD-10-CM | POA: Diagnosis not present

## 2022-11-04 DIAGNOSIS — Z7189 Other specified counseling: Secondary | ICD-10-CM | POA: Diagnosis not present

## 2022-11-04 LAB — BASIC METABOLIC PANEL
Anion gap: 12 (ref 5–15)
BUN: 55 mg/dL — ABNORMAL HIGH (ref 8–23)
CO2: 23 mmol/L (ref 22–32)
Calcium: 9.4 mg/dL (ref 8.9–10.3)
Chloride: 100 mmol/L (ref 98–111)
Creatinine, Ser: 9.68 mg/dL — ABNORMAL HIGH (ref 0.44–1.00)
GFR, Estimated: 4 mL/min — ABNORMAL LOW (ref >60)
Glucose, Bld: 158 mg/dL — ABNORMAL HIGH (ref 70–99)
Potassium: 3.4 mmol/L — ABNORMAL LOW (ref 3.5–5.1)
Sodium: 135 mmol/L (ref 135–145)

## 2022-11-04 LAB — CBC WITH DIFFERENTIAL/PLATELET
Abs Immature Granulocytes: 0.05 10*3/uL (ref 0.00–0.07)
Basophils Absolute: 0.1 10*3/uL (ref 0.0–0.1)
Basophils Relative: 1 %
Eosinophils Absolute: 0.2 10*3/uL (ref 0.0–0.5)
Eosinophils Relative: 3 %
HCT: 33.5 % — ABNORMAL LOW (ref 36.0–46.0)
Hemoglobin: 9.9 g/dL — ABNORMAL LOW (ref 12.0–15.0)
Immature Granulocytes: 1 %
Lymphocytes Relative: 35 %
Lymphs Abs: 2.5 10*3/uL (ref 0.7–4.0)
MCH: 23.9 pg — ABNORMAL LOW (ref 26.0–34.0)
MCHC: 29.6 g/dL — ABNORMAL LOW (ref 30.0–36.0)
MCV: 80.7 fL (ref 80.0–100.0)
Monocytes Absolute: 0.8 10*3/uL (ref 0.1–1.0)
Monocytes Relative: 10 %
Neutro Abs: 3.7 10*3/uL (ref 1.7–7.7)
Neutrophils Relative %: 50 %
Platelets: 202 10*3/uL (ref 150–400)
RBC: 4.15 MIL/uL (ref 3.87–5.11)
RDW: 21.6 % — ABNORMAL HIGH (ref 11.5–15.5)
WBC: 7.4 10*3/uL (ref 4.0–10.5)
nRBC: 0 % (ref 0.0–0.2)

## 2022-11-04 LAB — HEPATITIS B CORE ANTIBODY, TOTAL: Hep B Core Total Ab: NONREACTIVE

## 2022-11-04 MED ORDER — HEPARIN SODIUM (PORCINE) 1000 UNIT/ML IJ SOLN
INTRAMUSCULAR | Status: AC
  Filled 2022-11-04: qty 10

## 2022-11-04 MED ORDER — POTASSIUM CHLORIDE CRYS ER 20 MEQ PO TBCR
40.0000 meq | EXTENDED_RELEASE_TABLET | Freq: Once | ORAL | Status: AC
  Administered 2022-11-04: 40 meq via ORAL
  Filled 2022-11-04: qty 2

## 2022-11-04 MED ORDER — HEPARIN SODIUM (PORCINE) 1000 UNIT/ML IJ SOLN
INTRAMUSCULAR | Status: AC
  Filled 2022-11-04: qty 2

## 2022-11-04 MED ORDER — HEPARIN SODIUM (PORCINE) 1000 UNIT/ML DIALYSIS
1000.0000 [IU] | INTRAMUSCULAR | Status: DC | PRN
  Administered 2022-11-04 (×3): 1000 [IU]

## 2022-11-04 NOTE — Progress Notes (Signed)
PT Cancellation Note  Patient Details Name: Cynthia Dean MRN: [id number] DOB: 1950-11-16   Cancelled Treatment:    Reason Eval/Treat Not Completed: Other (comment).  Pt currently off floor at dialysis.  Will re-attempt PT session at a later date/time.  Hendricks Limes, PT 11/04/22, 11:14 AM

## 2022-11-04 NOTE — Progress Notes (Signed)
Progress Note   [name] JAS:[id number] DOB: February 01, 1951 DOA: 10/12/2022     23 DOS: the patient was seen and examined on 11/04/2022     Subjective:  Patient seen and examined at bedside this morning while she was undergoing hemodialysis She tells me her back pain is improving and she was able to sit yesterday for some time Denied nausea vomiting chest pain or cough   Brief hospital course:   Cynthia Dean is a 72 y/o F w/ PMH of ESRD on HD, COPD, DM2, HTN, depression, CVA who presented w/ back pain x 3 weeks.  Pt presented w/ back pain and was found to intradiscal abscess/lumbar discitis/b/l psoas abscess on MRI status post IR guided drainage. Patient has had waxing and waning mental status with intermittently refusing dialysis. Patient's family have decided to continue hemodialysis after speaking with their PCP.     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  Wound cx growing staph capitis.  ID consulted.  Abx de-escalated to cefazolin.  Not a good surgical candidate as per neuro surg.   Pt having severe low back and bilateral leg pain --cont cefazolin, for 6 weeks, End Date: 12/02/22 --pain management -- trial Norco, given ESRD will use IV fentanyl PRN and before dialysis  Fentanyl patch have been added to improve patient's back pain   Confusion and agitation Hospital delirium --Pt was agitated, and also not responding to questions or commands during early hospitalization.  Mental status did start to improve, and now pt is calm and interactive, however, at this time she has been having waxing and waning mental status --cont seroquel 50 mg nightly to help with delirium and sleep.     ESRD: on HD MWF.  Pt refused HD on 10/17/22, 10/18/22 and 3/27, 4/1.    Since then, has been agreeable to dialysis without further issues, getting dialysis in hospital bed. When attempts made to do HD in chair, she did not tolerate on account of severe back pain Fentanyl patch have been added to improve patient's back pain This is helping with back pain  Generalized weakness:  PT recs SNF. TOC following.   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 H&H are stable    Hypokalemia:-Improved --monitor and replete PRN    Peripheral neuropathy --  4/4 - Cautious trial of gabapentin 100 mg daily (ESRD - monitor closely for side effects) 4% - Pt notes significant improvement and no side effects so far --Continue low dose gabapentin and monitor   Hx of seizures:  continue on home dose of Keppra   HLD:  continue on statin    Hx of COPD: w/o exacerbation.  Continue on bronchodilators    DM2: well controlled HbA1c 5.2.  --d/c'ed BG checks    HTN:  --Toprol XL resumed --Continue holding losartan    GERD:  continue on PPI    Depression: severity unknown.  Continue on home dose of sertraline     Overweight: BMI 28.2.  Wt loss efforts recommended   Right foot pain - see peripheral neuropathy     DVT prophylaxis: Heparin SQ Code Status: Full code    Family Communication: Updated daughter/POA, [name], and other present family members at bedside on rounds today     Data Reviewed: I have reviewed patient's BMP  and CBC results showing potassium 3.4 which is being repleted     Disposition: Status is: Inpatient Patient still meets criteria for inpatient need given IV antibiotics As well as unsafe discharge without dialysis   Time spent: 45 minutes Consultants - Nephrology, Infectious Disease, Palliative Care   Level of care: Telemetry Medical      Physical Exam:   General exam: awake alert, no acute distress  Respiratory system: on room air, normal respiratory effort. Cardiovascular system: RRR, no pedal edema Gastrointestinal system: soft, NT, ND   Central nervous system: A&O x3. no gross focal neurologic deficits Extremities:  LUE fistula noted, no edema, normal tone Skin: dry, intact, normal temperature Psychiatry: Good mood, congruent affect, judgment and insight intact       Vitals:   11/04/22 1300 11/04/22 1307 11/04/22 1320 11/04/22 1406  BP: (!) 100/58 118/69 (!) 98/59 125/61  Pulse: 77 74 73 73  Resp: (!) 23 20 (!) 49 18  Temp:   99 F (37.2 C) 98.3 F (36.8 C)  TempSrc:   Oral   SpO2: 97% 97% 97% 93%  Weight:   89.9 kg   Height:         Author: Loyce Dys, MD 11/04/2022 2:57 PM  For on call review [email or web address].

## 2022-11-04 NOTE — Progress Notes (Addendum)
[facility]  ROUNDING NOTE   Subjective:   Cynthia Dean is a 72 y.o. 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 She presents to the ED with worsening lower extremity pain. Patient will be admitted for Back pain [M54.9] Bilateral leg pain [M79.604, M79.605]  Patient seen and evaluated during dialysis   HEMODIALYSIS FLOWSHEET:  Blood Flow Rate (mL/min): 400 mL/min Arterial Pressure (mmHg): -210 mmHg Venous Pressure (mmHg): 200 mmHg TMP (mmHg): 4 mmHg Ultrafiltration Rate (mL/min): 686 mL/min Dialysate Flow Rate (mL/min): 300 ml/min Dialysis Fluid Bolus: Normal Saline Bolus Amount (mL): 100 mL (uf still off)  Tolerating treatment well Denies pain at this time  Objective:  Vital signs in last 24 hours:  Temp:  [97.6 F (36.4 C)-98.6 F (37 C)] 97.6 F (36.4 C) (04/12 0800) Pulse Rate:  [67-76] 71 (04/12 0830) Resp:  [12-19] 12 (04/12 0830) BP: (118-159)/(55-67) 120/61 (04/12 0830) SpO2:  [93 %-99 %] 98 % (04/12 0830)  Weight change:  Filed Weights   11/02/22 1043 11/02/22 1457  Weight: 93.4 kg 90.5 kg    Intake/Output: I/O last 3 completed shifts: In: 340 [P.O.:340] Out: -    Intake/Output this shift:  No intake/output data recorded.  Physical Exam: General: NAD  Head: Normocephalic, atraumatic.  Moist oral mucosal membranes  Eyes: Anicteric  Lungs:  Clear to auscultation  Heart: Regular rate and rhythm  Abdomen:  Soft, nontender  Extremities: Trace peripheral edema.  Neurologic: Alert and oriented x 2, moving all 4 extremities  Skin: No lesions  Access: Left AVG    Basic Metabolic Panel: Recent Labs  Lab 10/30/22 0921 10/31/22 0450 11/02/22 1052 11/03/22 0514 11/04/22 0302  NA 138 138 141 140 135  K 4.0 3.6 3.4* 3.5 3.4*  CL 100 102 106 102 100  CO2 26 24 21* 26 23  GLUCOSE 161* 178* 174* 167*  158*  BUN 38* 45* 59* 43* 55*  CREATININE 7.37* 8.57* 10.74* 8.23* 9.68*  CALCIUM 10.0 9.5 9.1 9.6 9.4  MG  --  1.7  --   --   --   PHOS  --   --  6.0*  --   --      Liver Function Tests: Recent Labs  Lab 11/02/22 1052  ALBUMIN 2.3*    No results for input(s): "LIPASE", "AMYLASE" in the last 168 hours. No results for input(s): "AMMONIA" in the last 168 hours.  CBC: Recent Labs  Lab 10/31/22 0450 10/31/22 1446 11/02/22 1052 11/03/22 0514 11/04/22 0302  WBC 9.2 8.7 7.0 8.1 7.4  NEUTROABS  --   --   --  4.6 3.7  HGB 9.3* 9.8* 9.3* 10.3* 9.9*  HCT 31.7* 33.1* 32.3* 35.0* 33.5*  MCV 81.9 81.9 84.1 81.4 80.7  PLT 177 214 210 204 202     Cardiac Enzymes: No results for input(s): "CKTOTAL", "CKMB", "CKMBINDEX", "TROPONINI" in the last 168 hours.  BNP: Invalid input(s): "POCBNP"  CBG: Recent Labs  Lab 10/28/22 2207 10/31/22 0829 10/31/22 1152 11/01/22 0734  GLUCAP 174* 149* 154* 148*     Microbiology: Results for orders placed or performed during the hospital encounter of 10/12/22  Blood culture (routine x 2)     Status: None   Collection Time: 10/12/22  5:40 PM   Specimen: BLOOD  Result Value Ref Range Status   Specimen Description BLOOD BLOOD RIGHT HAND [facility]  Final   Special Requests  Final    BOTTLES DRAWN AEROBIC AND ANAEROBIC Blood Culture results may not be optimal due to an inadequate volume of blood received in culture bottles   Culture   Final    NO GROWTH 5 DAYS Performed at [facility], [address] [place], [place] [address]    Report Status 10/17/2022 FINAL  Final  Blood culture (routine x 2)     Status: None   Collection Time: 10/12/22  6:56 PM   Specimen: BLOOD  Result Value Ref Range Status   Specimen Description BLOOD BLOOD RIGHT FOREARM  Final   Special Requests   Final    BOTTLES DRAWN AEROBIC ONLY Blood Culture results may not be optimal due to an inadequate volume of blood received in culture bottles   Culture   Final     NO GROWTH 5 DAYS Performed at [facility], [address] [place], [place] [address]    Report Status 10/17/2022 FINAL  Final  Aerobic/Anaerobic Culture w Gram Stain (surgical/deep wound)     Status: None   Collection Time: 10/13/22 11:44 AM   Specimen: Abscess  Result Value Ref Range Status   Specimen Description   Final    ABSCESS Performed at [facility], [address] [place], [place] [address]    Special Requests   Final    MUSCLE ABSCESS Performed at [facility], [address] [place], [place] [address]    Gram Stain   Final    ABUNDANT WBC PRESENT, PREDOMINANTLY PMN NO ORGANISMS SEEN    Culture   Final    FEW STAPHYLOCOCCUS CAPITIS NO ANAEROBES ISOLATED Performed at [facility], [address] [place], [place] [address]    Report Status 10/18/2022 FINAL  Final   Organism ID, Bacteria STAPHYLOCOCCUS CAPITIS  Final      Susceptibility   Staphylococcus capitis - MIC*    CIPROFLOXACIN <=0.5 SENSITIVE Sensitive     ERYTHROMYCIN >=8 RESISTANT Resistant     GENTAMICIN <=0.5 SENSITIVE Sensitive     OXACILLIN <=0.25 SENSITIVE Sensitive     TETRACYCLINE <=1 SENSITIVE Sensitive     VANCOMYCIN 1 SENSITIVE Sensitive     TRIMETH/SULFA <=10 SENSITIVE Sensitive     CLINDAMYCIN INTERMEDIATE Intermediate     RIFAMPIN <=0.5 SENSITIVE Sensitive     Inducible Clindamycin NEGATIVE Sensitive     * FEW STAPHYLOCOCCUS CAPITIS    Coagulation Studies: No results for input(s): "LABPROT", "INR" in the last 72 hours.  Urinalysis: No results for input(s): "COLORURINE", "LABSPEC", "PHURINE", "GLUCOSEU", "HGBUR", "BILIRUBINUR", "KETONESUR", "PROTEINUR", "UROBILINOGEN", "NITRITE", "LEUKOCYTESUR" in the last 72 hours.  Invalid input(s): "APPERANCEUR"    Imaging: No results found.   Medications:    anticoagulant sodium citrate     [START ON 11/07/2022]  ceFAZolin (ANCEF) IV      ceFAZolin (ANCEF) IV Stopped (11/02/22 1430)    ceFAZolin  (ANCEF) IV     methocarbamol (ROBAXIN) IV 500 mg (10/25/22 2043)    (feeding supplement) PROSource Plus  30 mL Oral BID BM   Chlorhexidine Gluconate Cloth  6 each Topical Q0600   cinacalcet  30 mg Oral Q supper   feeding supplement (NEPRO CARB STEADY)  237 mL Oral TID BM   fentaNYL  1 patch Transdermal Q72H   gabapentin  100 mg Oral Daily   Gerhardt's butt cream   Topical QID   levETIRAcetam  1,000 mg Oral Daily   levETIRAcetam  250 mg Oral Q M,W,F   metoprolol succinate  100 mg Oral Daily   nystatin  5 mL Oral QID   pantoprazole  20 mg Oral BID   psyllium  1 packet Oral Daily   QUEtiapine  50 mg Oral QHS   sertraline  25 mg Oral Daily   sodium chloride flush  10-40 mL Intracatheter Q12H   umeclidinium-vilanterol  1 puff Inhalation Daily   alteplase, anticoagulant sodium citrate, fentaNYL (SUBLIMAZE) injection, hydrALAZINE, lidocaine (PF), lidocaine-prilocaine, LORazepam, methocarbamol (ROBAXIN) IV, ondansetron, senna-docusate, sodium chloride flush  Assessment/ Plan:  Ms. Adabelle Owensby is a 72 y.o.  female with end stage renal disease on hemodialysis, coronary artery disease, COPD with intermittent home O2, hypertension, hyperlipidemia, stroke, diabetes mellitus type II, and breast cancer who was admitted to [facility] on 10/12/2022 for Back pain [M54.9] Bilateral leg pain [M79.604, M79.605]  Patient was found to have vertebral osteomyelitis and psoas abscess. Cultures positive for staph capitis  [facility] MWF Left AVG 102.5kg  End-stage renal disease on hemodialysis. Resumed on MWF schedule.    Patient receiving dialysis treatment today, UF goal 1.5 L as tolerated.  Next treatment scheduled for Monday.    Due to HD system clotting, 2000 unit heparin bolus given with system prime. PRN heparin ordered to manage clotting in the future.   Will plan to sit patient in recliner for dialysis next week based on pain tolerance.  2. Anemia of chronic kidney  disease Normocytic Lab Results  Component Value Date   HGB 9.9 (L) 11/04/2022  Patient receives Mircera at outpatient clinic.  Will continue low-dose EPO.  3. Secondary Hyperparathyroidism: with outpatient labs: PTH 180 (3/11), phosphorus 5.4, calcium 9.3   Lab Results  Component Value Date   PTH 131 (H) 03/14/2018   CALCIUM 9.4 11/04/2022   CAION 1.09 (L) 08/28/2022   PHOS 6.0 (H) 11/02/2022   -Continue Cinacalcet. -Will continue to monitor bone minerals during this admission.  4.  Hypotension on hemodialysis treatment. Holding home regimen of losartan, metoprolol and torsemide. Blood pressure 95/58 during dialysis.  Will continue to monitor.  5. Diabetes mellitus type II with chronic kidney disease/renal manifestations: insulin dependent.  Primary team to manage sliding scale insulin.  6. Vertebral osteomyelitis, seen on CT lumber spine. Neurosurgery feels patient is not a surgical candidate CT guided aspiration of psoas abscess on 10/13/22. Cultures positive for Staph Capitis.   - Cefazolin with dialysis for 8 weeks.  Per infectious Disease recommendations.    LOS: 23   4/12/20248:45 AM

## 2022-11-04 NOTE — Progress Notes (Addendum)
0900: Patient's set up clotted, multiple alarms for high TMP. Returned back patient's blood. Awaiting new set up to restart treatment. HD ND notified.  2423: Heparin 2000 units bolus given as ordered and restarted HD.  1020: Patient system clotted. Rinsed back patient's blood and notified HD NP. 500 units of Heparin administered as ordered to prevent clotting. Tx restarted at 1059. Monitored patient closely.

## 2022-11-04 NOTE — Progress Notes (Signed)
Hemodialysis note  Received patient in bed to unit. Alert and oriented to self.  Informed consent signed and in chart.  Treatment initiated: 0813 Treatment completed: 1307  Patient tolerated well. Transported back to room, alert without acute distress.  Report given to patient's RN.   Access used: Left Upper arm AVG Access issues: none  Total UF removed: 1.3L Medication(s) given:  Ancef 3grams IV  Post HD weight: 89.9 kg   Ralene Muskrat RN Kidney Dialysis Unit

## 2022-11-04 NOTE — Plan of Care (Signed)

## 2022-11-04 NOTE — Progress Notes (Signed)
Daily Progress Note   Patient Name: Cynthia Dean       Date: 11/04/2022 DOB: [birth year]  Age: 72 y.o. MRN#: [id number] Attending Physician: Loyce Dys, MD Primary Care Physician: Loistine Chance, MD Admit Date: 10/12/2022  Reason for Consultation/Follow-up: Establishing goals of care  Subjective: Notes and labs reviewed.  Saw patient while on dialysis.  She is resting in bed, and is smiling and happy.  She states her pain is much improved from what it has been.    Recommend continuing current care. Continue PRN  IV fentanyl.  Would recommend adjusting fentanyl patch depending on patient's PRN IV needs.  Daughter updated on patient's status.  Discussed that I would follow-up Tuesday on return to service.  Patient will need time for outcomes to see if she will be able to sit for dialysis with fentanyl in place.  Length of Stay: 23  Current Medications: Scheduled Meds:   (feeding supplement) PROSource Plus  30 mL Oral BID BM   Chlorhexidine Gluconate Cloth  6 each Topical Q0600   cinacalcet  30 mg Oral Q supper   feeding supplement (NEPRO CARB STEADY)  237 mL Oral TID BM   fentaNYL  1 patch Transdermal Q72H   gabapentin  100 mg Oral Daily   Gerhardt's butt cream   Topical QID   levETIRAcetam  1,000 mg Oral Daily   levETIRAcetam  250 mg Oral Q M,W,F   metoprolol succinate  100 mg Oral Daily   nystatin  5 mL Oral QID   pantoprazole  20 mg Oral BID   potassium chloride  40 mEq Oral Once   psyllium  1 packet Oral Daily   QUEtiapine  50 mg Oral QHS   sertraline  25 mg Oral Daily   sodium chloride flush  10-40 mL Intracatheter Q12H   umeclidinium-vilanterol  1 puff Inhalation Daily    Continuous Infusions:  anticoagulant sodium citrate     [START ON 11/07/2022]   ceFAZolin (ANCEF) IV      ceFAZolin (ANCEF) IV Stopped (11/02/22 1430)    ceFAZolin (ANCEF) IV Stopped (11/04/22 1244)   methocarbamol (ROBAXIN) IV 500 mg (10/25/22 2043)    PRN Meds: alteplase, anticoagulant sodium citrate, fentaNYL (SUBLIMAZE) injection, heparin, hydrALAZINE, lidocaine (PF), lidocaine-prilocaine, LORazepam, methocarbamol (ROBAXIN) IV, ondansetron, senna-docusate, sodium chloride flush  Physical  Exam Pulmonary:     Effort: Pulmonary effort is normal.  Neurological:     Mental Status: She is alert.             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/17/2022   Back pain 10/12/2022   Discitis of lumbar region 10/12/2022   Myositis 10/12/2022   Hemoglobin drop 08/28/2022   Chronic respiratory failure with hypoxia 08/26/2022   Acute on chronic blood loss anemia 08/26/2022   History of lower GI bleeding 08/26/2022   Insulin-requiring or dependent type II diabetes mellitus 08/26/2022   Chronic anticoagulation 08/26/2022   History of pulmonary embolism 03/20/2022 08/26/2022   Breast cancer, left S/P partial mastectomy,03/04/2022 08/26/2022   Generalized weakness    Unable to ambulate 05/09/2022   ESRD on dialysis    Bilateral leg pain    Depression    Pulmonary emboli    Obesity with body mass index (BMI) of 30.0 to 39.9    Anemia    Chronic diastolic CHF (congestive heart failure)    Seroma of  breast    Bacteremia due to coagulase-negative Staphylococcus    Left breast abscess    Urinary tract infection 03/22/2022   Anemia of chronic disease 03/22/2022   Acute pulmonary embolism without acute cor pulmonale 03/20/2022   Dyslipidemia 03/20/2022   Diabetic neuropathy 03/20/2022   Sepsis due to cellulitis 03/20/2022   Delay kidney tx func d/t fluid overload requiring acute dialysis 01/29/2020   Delay kidney tx func d/t ATN and fluid overload require acute dialysis 01/29/2020   Hypoglycemia 10/21/2019   Unresponsiveness 10/21/2019   GERD (gastroesophageal reflux disease) 10/21/2019   Hypothermia    Sepsis with encephalopathy without septic shock    Diarrhea    Acute metabolic encephalopathy    Acute on chronic diastolic CHF (congestive heart failure)    Acute on chronic respiratory failure with hypoxia 07/30/2019   Rectal bleed 02/13/2019   Acute respiratory failure with hypoxia 12/31/2018   History of CVA (cerebrovascular accident) 05/29/2018   Aphasia as late effect of cerebrovascular accident (CVA) 05/01/2018   Seizure disorder 03/14/2018   Stroke 03/07/2018   Slurred speech 11/14/2017   Type 2 diabetes mellitus with ESRD (end-stage renal disease) 11/12/2017   CAD (coronary artery disease) 11/12/2017   COPD (chronic obstructive pulmonary disease) 11/12/2017   ESRD on hemodialysis 11/12/2017   CVA (cerebral vascular accident) 04/14/2016   Essential hypertension 11/27/2015   Hyperlipidemia 11/27/2015   Prolonged Q-T interval on ECG 11/26/2015   Aphasia complicating stroke 11/05/2015   Cervical spine arthritis 07/27/2015   Diabetic retinopathy without macular edema associated with type 2 diabetes mellitus 07/27/2015   Osteoarthritis of spine with radiculopathy, lumbar region 07/27/2015   Obesity, Class III, BMI 40-49.9 (morbid obesity) 07/27/2015  Vitamin D deficiency 01/21/2011   Diastolic heart failure 10/27/2010    Palliative Care Assessment & Plan     Recommendations/Plan:  Recommend continuing current care. Continue PRN  IV fentanyl.  Would recommend adjusting fentanyl patch depending on patient's PRN IV needs.   Code Status:    Code Status Orders  (From admission, onward)           Start     Ordered   11/01/22 1535  Do not attempt resuscitation (DNR)  Continuous       Question Answer Comment  If patient has no pulse and is not breathing Do Not Attempt Resuscitation   If patient has a pulse and/or is breathing: Medical Treatment Goals COMFORT MEASURES: Keep clean/warm/dry, use medication by any route; positioning, wound care and other measures to relieve pain/suffering; use oxygen, suction/manual treatment of airway obstruction for comfort; do not transfer unless for comfort needs.   Consent: Discussion documented in EHR or advanced directives reviewed      11/01/22 1534           Code Status History     Date Active Date Inactive Code Status Order ID Comments User Context   10/12/2022 1101 11/01/2022 1534 Full Code [id number]  Charise Killian, MD Inpatient   08/26/2022 2018 09/09/2022 2244 Full Code [id number]  Andris Baumann, MD ED   05/09/2022 0919 05/17/2022 0109 Full Code [id number]  Lorretta Harp, MD ED   03/20/2022 0121 04/02/2022 0126 Full Code [id number]  Mansy, Vernetta Honey, MD ED   01/29/2020 2038 01/30/2020 2215 Full Code [id number]  Lucile Shutters, MD Inpatient   01/29/2020 1210 01/29/2020 2038 Full Code [id number]  Lucile Shutters, MD Inpatient   10/21/2019 0902 10/25/2019 2211 Full Code [id number]  Lorretta Harp, MD ED   07/30/2019 2224 08/11/2019 2130 Full Code [id number]  Mansy, Vernetta Honey, MD ED   02/13/2019 1329 02/16/2019 1849 Full Code [id number]  Jimmye Norman, NP ED   12/31/2018 1159 01/02/2019 1330 Full Code [id number]  Jimmye Norman, NP ED   05/29/2018 1541 05/30/2018 2041 Full Code [id number]  Alford Highland, MD ED   03/14/2018 0141 03/15/2018 0336 Full Code [id number]  Cammy Copa, MD Inpatient   03/08/2018 0005  03/09/2018 2108 Full Code [id number]  Altamese Dilling, MD Inpatient   11/15/2017 0017 11/17/2017 1809 Full Code [id number]  Oralia Manis, MD ED   11/12/2017 2219 11/13/2017 1935 Full Code [id number]  Oralia Manis, MD ED   04/14/2016 1330 04/14/2016 1700 Full Code [id number]  Adrian Saran, MD ED   08/08/2015 2257 08/09/2015 2054 Full Code [id number]  Wyatt Haste, MD ED   04/17/2015 1334 04/17/2015 1836 Full Code [id number]  Annice Needy, MD Inpatient      Advance Directive Documentation    Flowsheet Row Most Recent Value  Type of Advance Directive Healthcare Power of Attorney  Pre-existing out of facility DNR order (yellow form or pink MOST form) --  "MOST" Form in Place? --       Prognosis:  Unable to determine   Thank you for allowing the Palliative Medicine Team to assist in the care of this patient.  Morton Stall, NP  Please contact Palliative Medicine Team phone at [phone] for questions and concerns.

## 2022-11-05 DIAGNOSIS — M4646 Discitis, unspecified, lumbar region: Secondary | ICD-10-CM | POA: Diagnosis not present

## 2022-11-05 LAB — CBC WITH DIFFERENTIAL/PLATELET
Abs Immature Granulocytes: 0.06 10*3/uL (ref 0.00–0.07)
Basophils Absolute: 0.1 10*3/uL (ref 0.0–0.1)
Basophils Relative: 1 %
Eosinophils Absolute: 0.2 10*3/uL (ref 0.0–0.5)
Eosinophils Relative: 2 %
HCT: 33.8 % — ABNORMAL LOW (ref 36.0–46.0)
Hemoglobin: 10.1 g/dL — ABNORMAL LOW (ref 12.0–15.0)
Immature Granulocytes: 1 %
Lymphocytes Relative: 25 %
Lymphs Abs: 2 10*3/uL (ref 0.7–4.0)
MCH: 24.3 pg — ABNORMAL LOW (ref 26.0–34.0)
MCHC: 29.9 g/dL — ABNORMAL LOW (ref 30.0–36.0)
MCV: 81.3 fL (ref 80.0–100.0)
Monocytes Absolute: 0.9 10*3/uL (ref 0.1–1.0)
Monocytes Relative: 11 %
Neutro Abs: 5 10*3/uL (ref 1.7–7.7)
Neutrophils Relative %: 60 %
Platelets: 194 10*3/uL (ref 150–400)
RBC: 4.16 MIL/uL (ref 3.87–5.11)
RDW: 21.7 % — ABNORMAL HIGH (ref 11.5–15.5)
WBC: 8.3 10*3/uL (ref 4.0–10.5)
nRBC: 0 % (ref 0.0–0.2)

## 2022-11-05 LAB — BASIC METABOLIC PANEL
Anion gap: 12 (ref 5–15)
BUN: 27 mg/dL — ABNORMAL HIGH (ref 8–23)
CO2: 28 mmol/L (ref 22–32)
Calcium: 9.3 mg/dL (ref 8.9–10.3)
Chloride: 99 mmol/L (ref 98–111)
Creatinine, Ser: 6.09 mg/dL — ABNORMAL HIGH (ref 0.44–1.00)
GFR, Estimated: 7 mL/min — ABNORMAL LOW (ref >60)
Glucose, Bld: 182 mg/dL — ABNORMAL HIGH (ref 70–99)
Potassium: 3.6 mmol/L (ref 3.5–5.1)
Sodium: 139 mmol/L (ref 135–145)

## 2022-11-05 LAB — HEPATITIS B E ANTIGEN: Hep B E Ag: NEGATIVE

## 2022-11-05 NOTE — Progress Notes (Signed)
Progress Note   [name] YHT:[id number] DOB: 09-Jul-1951 DOA: 10/12/2022     24 DOS: the patient was seen and examined on 11/05/2022    Subjective:  Patient seen and examined at bedside this morning  She tells me her back pain is improving  Nursing staff making patient try to sit in the bed as much as she is able Nephrology team planning to have patient sit in the recliner next week for dialysis Denied nausea vomiting chest pain or cough   Brief hospital course:   Cynthia Dean is a 72 y/o F w/ PMH of ESRD on HD, COPD, DM2, HTN, depression, CVA who presented w/ back pain x 3 weeks.  Pt presented w/ back pain and was found to intradiscal abscess/lumbar discitis/b/l psoas abscess on MRI status post IR guided drainage. Patient has had waxing and waning mental status with intermittently refusing dialysis. Patient's family have decided to continue hemodialysis after speaking with their PCP.     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  Wound cx growing staph capitis.  ID consulted.  Abx de-escalated to cefazolin.  Not a good surgical candidate as per neuro surg.   Pt having severe low back and bilateral leg pain --cont cefazolin, for 6 weeks, End Date: 12/02/22 --pain management -- trial Norco, given ESRD will use IV fentanyl PRN and before dialysis  Fentanyl patch have been added to improve patient's back pain  Nephrology team planning to have patient sit in the recliner next week for dialysis  Confusion and agitation Hospital delirium --Pt was agitated, and also not responding to questions or commands during early hospitalization.  Mental status did start to improve, and now pt is calm and interactive, however, at this time she has been having waxing and waning  mental status --cont seroquel 50 mg nightly to help with delirium and sleep.     ESRD: on HD MWF.  Pt refused HD on 10/17/22, 10/18/22 and 3/27, 4/1.   Since then, has been agreeable to dialysis without further issues, getting dialysis in hospital bed. When attempts made to do HD in chair, she did not tolerate on account of severe back pain Fentanyl patch have been added to improve patient's back pain This is helping with back pain   Generalized weakness:  PT recs SNF. TOC following.   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 H&H are stable    Hypokalemia:-Improved --monitor and replete PRN    Peripheral neuropathy --  4/4 - Cautious trial of gabapentin 100 mg daily (ESRD - monitor closely for side effects) 4% - Pt notes significant improvement and no side effects so far --Continue low dose gabapentin and monitor   Hx of seizures:  continue on home dose of Keppra   HLD:  continue on statin    Hx of COPD: w/o exacerbation.  Continue on bronchodilators    DM2: well controlled HbA1c 5.2.  --d/c'ed BG checks    HTN:  --Toprol XL resumed --Continue holding losartan    GERD:  continue on PPI    Depression: severity unknown.  Continue on home dose of sertraline     Overweight: BMI 28.2.  Wt loss efforts recommended   Right foot pain - see peripheral neuropathy     DVT prophylaxis: Heparin SQ  Code  Status: DNR this has been confirmed by me from patient's daughter [name]   Family Communication: Updated daughter/POA, [name], over the phone today   Data Reviewed: I have reviewed patient's BMP and CBC results showing 2139 potassium 3.6     Disposition: Status is: Inpatient Patient still meets criteria for inpatient need given IV antibiotics As well as unsafe discharge without dialysis   Time spent: 40 minutes Consultants - Nephrology, Infectious Disease, Palliative Care   Level of care: Telemetry Medical      Physical Exam:   General exam: awake alert, no acute  distress  Respiratory system: on room air, normal respiratory effort. Cardiovascular system: RRR, no pedal edema Gastrointestinal system: soft, NT, ND  Central nervous system: A&O x3. no gross focal neurologic deficits Extremities:  LUE fistula noted, no edema, normal tone Skin: dry, intact, normal temperature Psychiatry: Good mood, congruent affect, judgment and insight intact       Vitals:   11/04/22 1955 11/05/22 0638 11/05/22 0816 11/05/22 0929  BP: (!) 113/57 (!) 156/82 (!) 146/68 (!) 160/71  Pulse: 84 79 71 73  Resp: 16 20 20    Temp: 98.9 F (37.2 C) (!) 97.5 F (36.4 C) 97.8 F (36.6 C)   TempSrc: Oral Oral Oral   SpO2: 100% 96% 97%   Weight:      Height:        Author: Loyce Dys, MD 11/05/2022 11:49 AM  For on call review [email or web address].

## 2022-11-05 NOTE — Progress Notes (Signed)
[facility]  ROUNDING NOTE   Subjective:   Cynthia Dean is a 72 y.o. 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 She presents to the ED with worsening lower extremity pain. Patient will be admitted for Back pain [M54.9] Bilateral leg pain [M79.604, M79.605]  Update Patient resting quietly No family at bedside Room air No lower extremity edema Denies pain or discomfort at this time  Dialysis received yesterday, tolerated well  Objective:  Vital signs in last 24 hours:  Temp:  [97.5 F (36.4 C)-99 F (37.2 C)] 97.8 F (36.6 C) (04/13 0816) Pulse Rate:  [71-84] 73 (04/13 0929) Resp:  [14-49] 20 (04/13 0816) BP: (88-160)/(55-82) 160/71 (04/13 0929) SpO2:  [93 %-100 %] 97 % (04/13 0816) Weight:  [89.9 kg] 89.9 kg (04/12 1320)  Weight change:  Filed Weights   11/02/22 1457 11/04/22 0845 11/04/22 1320  Weight: 90.5 kg 90.5 kg 89.9 kg    Intake/Output: I/O last 3 completed shifts: In: 200 [IV Piggyback:200] Out: 1300 [Other:1300]   Intake/Output this shift:  No intake/output data recorded.  Physical Exam: General: NAD  Head: Normocephalic, atraumatic.  Moist oral mucosal membranes  Eyes: Anicteric  Lungs:  Clear to auscultation  Heart: Regular rate and rhythm  Abdomen:  Soft, nontender  Extremities: No peripheral edema.  Neurologic: Alert and oriented x 2, moving all 4 extremities  Skin: No lesions  Access: Left AVG    Basic Metabolic Panel: Recent Labs  Lab 10/31/22 0450 11/02/22 1052 11/03/22 0514 11/04/22 0302 11/05/22 0600  NA 138 141 140 135 139  K 3.6 3.4* 3.5 3.4* 3.6  CL 102 106 102 100 99  CO2 24 21* GLUCOSE 178* 174* 167* 158* 182*  BUN 45* 59* 43* 55* 27*  CREATININE 8.57* 10.74* 8.23* 9.68* 6.09*  CALCIUM 9.5 9.1 9.6 9.4 9.3  MG 1.7  --   --   --   --   PHOS  --  6.0*  --   --    --      Liver Function Tests: Recent Labs  Lab 11/02/22 1052  ALBUMIN 2.3*    No results for input(s): "LIPASE", "AMYLASE" in the last 168 hours. No results for input(s): "AMMONIA" in the last 168 hours.  CBC: Recent Labs  Lab 10/31/22 1446 11/02/22 1052 11/03/22 0514 11/04/22 0302 11/05/22 0600  WBC 8.7 7.0 8.1 7.4 8.3  NEUTROABS  --   --  4.6 3.7 5.0  HGB 9.8* 9.3* 10.3* 9.9* 10.1*  HCT 33.1* 32.3* 35.0* 33.5* 33.8*  MCV 81.9 84.1 81.4 80.7 81.3  PLT 214 210 204 202 194     Cardiac Enzymes: No results for input(s): "CKTOTAL", "CKMB", "CKMBINDEX", "TROPONINI" in the last 168 hours.  BNP: Invalid input(s): "POCBNP"  CBG: Recent Labs  Lab 10/31/22 0829 10/31/22 1152 11/01/22 0734  GLUCAP 149* 154* 148*     Microbiology: Results for orders placed or performed during the hospital encounter of 10/12/22  Blood culture (routine x 2)     Status: None   Collection Time: 10/12/22  5:40 PM   Specimen: BLOOD  Result Value Ref Range Status   Specimen Description BLOOD BLOOD RIGHT HAND [facility]  Final   Special Requests   Final    BOTTLES DRAWN AEROBIC AND ANAEROBIC Blood Culture results may not be optimal due to an inadequate volume of blood received in culture bottles  Culture   Final    NO GROWTH 5 DAYS Performed at [facility], [address] [place], [place] [address]    Report Status 10/17/2022 FINAL  Final  Blood culture (routine x 2)     Status: None   Collection Time: 10/12/22  6:56 PM   Specimen: BLOOD  Result Value Ref Range Status   Specimen Description BLOOD BLOOD RIGHT FOREARM  Final   Special Requests   Final    BOTTLES DRAWN AEROBIC ONLY Blood Culture results may not be optimal due to an inadequate volume of blood received in culture bottles   Culture   Final    NO GROWTH 5 DAYS Performed at [facility], [address] [place], [place] [address]    Report Status 10/17/2022 FINAL  Final  Aerobic/Anaerobic Culture w Gram  Stain (surgical/deep wound)     Status: None   Collection Time: 10/13/22 11:44 AM   Specimen: Abscess  Result Value Ref Range Status   Specimen Description   Final    ABSCESS Performed at [facility], [address], [place], [place] [address]    Special Requests   Final    MUSCLE ABSCESS Performed at [facility], [address] [place], [place] [address]    Gram Stain   Final    ABUNDANT WBC PRESENT, PREDOMINANTLY PMN NO ORGANISMS SEEN    Culture   Final    FEW STAPHYLOCOCCUS CAPITIS NO ANAEROBES ISOLATED Performed at [facility], [address], [place], [place] [address]    Report Status 10/18/2022 FINAL  Final   Organism ID, Bacteria STAPHYLOCOCCUS CAPITIS  Final      Susceptibility   Staphylococcus capitis - MIC*    CIPROFLOXACIN <=0.5 SENSITIVE Sensitive     ERYTHROMYCIN >=8 RESISTANT Resistant     GENTAMICIN <=0.5 SENSITIVE Sensitive     OXACILLIN <=0.25 SENSITIVE Sensitive     TETRACYCLINE <=1 SENSITIVE Sensitive     VANCOMYCIN 1 SENSITIVE Sensitive     TRIMETH/SULFA <=10 SENSITIVE Sensitive     CLINDAMYCIN INTERMEDIATE Intermediate     RIFAMPIN <=0.5 SENSITIVE Sensitive     Inducible Clindamycin NEGATIVE Sensitive     * FEW STAPHYLOCOCCUS CAPITIS    Coagulation Studies: No results for input(s): "LABPROT", "INR" in the last 72 hours.  Urinalysis: No results for input(s): "COLORURINE", "LABSPEC", "PHURINE", "GLUCOSEU", "HGBUR", "BILIRUBINUR", "KETONESUR", "PROTEINUR", "UROBILINOGEN", "NITRITE", "LEUKOCYTESUR" in the last 72 hours.  Invalid input(s): "APPERANCEUR"    Imaging: No results found.   Medications:    anticoagulant sodium citrate     [START ON 11/07/2022]  ceFAZolin (ANCEF) IV      ceFAZolin (ANCEF) IV Stopped (11/02/22 1430)    ceFAZolin (ANCEF) IV Stopped (11/04/22 1244)   methocarbamol (ROBAXIN) IV 110 mL/hr at 11/04/22 1530    (feeding supplement) PROSource Plus  30 mL Oral BID BM   Chlorhexidine Gluconate  Cloth  6 each Topical Q0600   cinacalcet  30 mg Oral Q supper   feeding supplement (NEPRO CARB STEADY)  237 mL Oral TID BM   fentaNYL  1 patch Transdermal Q72H   gabapentin  100 mg Oral Daily   Gerhardt's butt cream   Topical QID   levETIRAcetam  1,000 mg Oral Daily   levETIRAcetam  250 mg Oral Q M,W,F   metoprolol succinate  100 mg Oral Daily   nystatin  5 mL Oral QID   pantoprazole  20 mg Oral BID   psyllium  1 packet  Oral Daily   QUEtiapine  50 mg Oral QHS   sertraline  25 mg Oral Daily   sodium chloride flush  10-40 mL Intracatheter Q12H   umeclidinium-vilanterol  1 puff Inhalation Daily   alteplase, anticoagulant sodium citrate, fentaNYL (SUBLIMAZE) injection, hydrALAZINE, lidocaine (PF), lidocaine-prilocaine, LORazepam, methocarbamol (ROBAXIN) IV, ondansetron, senna-docusate, sodium chloride flush  Assessment/ Plan:  [name] is a 72 y.o.  female with end stage renal disease on hemodialysis, coronary artery disease, COPD with intermittent home O2, hypertension, hyperlipidemia, stroke, diabetes mellitus type II, and breast cancer who was admitted to [facility] on 10/12/2022 for Back pain [M54.9] Bilateral leg pain [M79.604, M79.605]  Patient was found to have vertebral osteomyelitis and psoas abscess. Cultures positive for staph capitis  [facility] MWF Left AVG 102.5kg  End-stage renal disease on hemodialysis. Resumed on MWF schedule.    Completed dialysis yesterday, UF 1.3 L achieved.  Next treatment scheduled for Monday.  Will plan to sit patient in recliner for dialysis next week based on pain tolerance.  2. Anemia of chronic kidney disease Normocytic Lab Results  Component Value Date   HGB 10.1 (L) 11/05/2022  Patient receives Mircera at outpatient clinic.  Hemoglobin within optimal range.  Will continue low-dose EPO.  3. Secondary Hyperparathyroidism: with outpatient labs: PTH 180 (3/11), phosphorus 5.4, calcium 9.3   Lab Results  Component  Value Date   PTH 131 (H) 03/14/2018   CALCIUM 9.3 11/05/2022   CAION 1.09 (L) 08/28/2022   PHOS 6.0 (H) 11/02/2022   -Continue Cinacalcet. -Calcium acceptable.   4.  Hypotension on hemodialysis treatment. Holding home regimen of losartan, metoprolol and torsemide. Blood pressure slightly elevated this morning, 160/71.  5. Diabetes mellitus type II with chronic kidney disease/renal manifestations: insulin dependent.  Sliding scale insulin managed by primary team.  Glucose well-controlled at this time.  6. Vertebral osteomyelitis, seen on CT lumber spine. Neurosurgery feels patient is not a surgical candidate CT guided aspiration of psoas abscess on 10/13/22. Cultures positive for Staph Capitis.   -Infectious disease recommending cefazolin with dialysis treatments for 8-week course. - Will continue    LOS: 24   4/13/202410:08 AM

## 2022-11-06 DIAGNOSIS — M4646 Discitis, unspecified, lumbar region: Secondary | ICD-10-CM | POA: Diagnosis not present

## 2022-11-06 LAB — CBC WITH DIFFERENTIAL/PLATELET
Abs Immature Granulocytes: 0.07 10*3/uL (ref 0.00–0.07)
Basophils Absolute: 0.1 10*3/uL (ref 0.0–0.1)
Basophils Relative: 1 %
Eosinophils Absolute: 0.2 10*3/uL (ref 0.0–0.5)
Eosinophils Relative: 3 %
HCT: 33.4 % — ABNORMAL LOW (ref 36.0–46.0)
Hemoglobin: 9.7 g/dL — ABNORMAL LOW (ref 12.0–15.0)
Immature Granulocytes: 1 %
Lymphocytes Relative: 32 %
Lymphs Abs: 2.3 10*3/uL (ref 0.7–4.0)
MCH: 23.8 pg — ABNORMAL LOW (ref 26.0–34.0)
MCHC: 29 g/dL — ABNORMAL LOW (ref 30.0–36.0)
MCV: 82.1 fL (ref 80.0–100.0)
Monocytes Absolute: 0.9 10*3/uL (ref 0.1–1.0)
Monocytes Relative: 12 %
Neutro Abs: 3.7 10*3/uL (ref 1.7–7.7)
Neutrophils Relative %: 51 %
Platelets: 189 10*3/uL (ref 150–400)
RBC: 4.07 MIL/uL (ref 3.87–5.11)
RDW: 21.4 % — ABNORMAL HIGH (ref 11.5–15.5)
Smear Review: NORMAL
WBC: 7.2 10*3/uL (ref 4.0–10.5)
nRBC: 0 % (ref 0.0–0.2)

## 2022-11-06 LAB — BASIC METABOLIC PANEL
Anion gap: 11 (ref 5–15)
BUN: 34 mg/dL — ABNORMAL HIGH (ref 8–23)
CO2: 25 mmol/L (ref 22–32)
Calcium: 8.7 mg/dL — ABNORMAL LOW (ref 8.9–10.3)
Chloride: 104 mmol/L (ref 98–111)
Creatinine, Ser: 6.97 mg/dL — ABNORMAL HIGH (ref 0.44–1.00)
GFR, Estimated: 6 mL/min — ABNORMAL LOW (ref >60)
Glucose, Bld: 158 mg/dL — ABNORMAL HIGH (ref 70–99)
Potassium: 3.2 mmol/L — ABNORMAL LOW (ref 3.5–5.1)
Sodium: 140 mmol/L (ref 135–145)

## 2022-11-06 MED ORDER — POTASSIUM CHLORIDE CRYS ER 20 MEQ PO TBCR
40.0000 meq | EXTENDED_RELEASE_TABLET | Freq: Two times a day (BID) | ORAL | Status: AC
  Administered 2022-11-06 (×2): 40 meq via ORAL
  Filled 2022-11-06 (×2): qty 2

## 2022-11-06 NOTE — Progress Notes (Signed)
Progress Note   [name] DGL:[id number] DOB: 09-12-1950 DOA: 10/12/2022     25 DOS: the patient was seen and examined on 11/06/2022    Subjective:  Patient seen and examined at bedside this morning  Does not seem interested in sitting up in bed According to nursing staff patient will try to sit up in bed today in the presence of family when nephrology team comes to speak to her. Nephrology team planning to have patient sit in the recliner next week for dialysis Denied nausea vomiting chest pain or cough   Brief hospital course:   Cynthia Dean is a 72 y/o F w/ PMH of ESRD on HD, COPD, DM2, HTN, depression, CVA who presented w/ back pain x 3 weeks.  Pt presented w/ back pain and was found to intradiscal abscess/lumbar discitis/b/l psoas abscess on MRI status post IR guided drainage. Patient has had waxing and waning mental status with intermittently refusing dialysis. Patient's family have decided to continue hemodialysis after speaking with their PCP.     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  Wound cx growing staph capitis.  ID consulted.  Abx de-escalated to cefazolin.  Not a good surgical candidate as per neuro surg.   Pt having severe low back and bilateral leg pain but improving --cont cefazolin, for 6 weeks, End Date: 12/02/22 --pain management -- trial Norco, given ESRD will use IV fentanyl PRN and before dialysis  Fentanyl patch have been added to improve patient's back pain  Nephrology team planning to have patient sit in the recliner next week for dialysis   Confusion and agitation Hospital delirium --Pt was agitated, and also not responding to questions or commands during early hospitalization.  Mental status did start to improve, and now pt is  calm and interactive, however, at this time she has been having waxing and waning mental status --cont seroquel 50 mg nightly to help with delirium and sleep.     ESRD: on HD MWF.  Pt refused HD on 10/17/22, 10/18/22 and 3/27, 4/1.   Since then, has been agreeable to dialysis without further issues, getting dialysis in hospital bed. When attempts made to do HD in chair, she did not tolerate on account of severe back pain Fentanyl patch have been added to improve patient's back pain This is helping with back pain   Generalized weakness:  PT recs SNF. TOC following.   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 H&H are stable    Hypokalemia:-Improved --monitor and replete PRN    Peripheral neuropathy --  4/4 - Cautious trial of gabapentin 100 mg daily (ESRD - monitor closely for side effects) 4% - Pt notes significant improvement and no side effects so far --Continue low dose gabapentin and monitor   Hx of seizures:  continue on home dose of Keppra   HLD:  continue on statin    Hx of COPD: w/o exacerbation.  Continue on bronchodilators    DM2: well controlled HbA1c 5.2.  --d/c'ed BG checks    HTN:  --Toprol XL resumed --Continue holding losartan    GERD:  continue on PPI    Depression: severity unknown.  Continue on home dose of sertraline     Overweight: BMI 28.2.  Wt loss efforts recommended   Right foot pain -  see peripheral neuropathy     DVT prophylaxis: Heparin SQ   Code Status: DNR this has been confirmed by me from patient's daughter Cynthia Dean   Family Communication: Updated daughter/POA, Cynthia Dean, over the phone today   Data Reviewed: I have reviewed patient's BMP and CBC sodium level 148 potassium 3.2     Disposition: Status is: Inpatient Patient still meets criteria for inpatient need given IV antibiotics As well as unsafe discharge without dialysis   Time spent: 38 minutes Consultants - Nephrology, Infectious Disease, Palliative Care   Level of care:  Telemetry Medical      Physical Exam:   General exam: awake alert, no acute distress  Respiratory system: on room air, normal respiratory effort. Cardiovascular system: RRR, no pedal edema Gastrointestinal system: soft, NT, ND  Central nervous system: A&O x3. no gross focal neurologic deficits Extremities:  LUE fistula noted, no edema, normal tone Skin: dry, intact, normal temperature Psychiatry: Good mood, congruent affect, judgment and insight intact    Vitals:   11/05/22 0929 11/05/22 1708 11/05/22 2059 11/06/22 0622  BP: (!) 160/71 (!) 113/55 (!) 107/59 (!) 133/101  Pulse: 73 72 72 68  Resp:  18 19 20   Temp:  98 F (36.7 C) 98.5 F (36.9 C) 98.3 F (36.8 C)  TempSrc:    Oral  SpO2:  100% 93% 95%  Weight:      Height:         Author: Loyce Dys, MD 11/06/2022 12:17 PM  For on call review [email or web address].

## 2022-11-06 NOTE — Progress Notes (Signed)
Upon Romelle Starcher (listed as pt's friend) arrival to the pt's room; verbally advised him that the pt has not been cooperative when attempting to put her in a chair for hemodialysis; advised him that I wanted to put her hospital bed in the chair position with him in the room in order for him to see how she responded to being in a chair position; pt repositioned in the bed with the assistance of A. Toni Arthurs NT; the hospital bed was placed in the chair position; pt spoke to [name] indicating she loved him; as this nurse was leaving the pt's room the pt started crying out and grabbing her right leg indicating "my leg hurts, move me down"; [name] indicated that he had this and was going to get her to eat lunch

## 2022-11-06 NOTE — Progress Notes (Signed)
Physical Therapy Treatment Patient Details Name: Cynthia Dean MRN: [id number] DOB: [birth year] Today's Date: 11/06/2022   History of Present Illness Cynthia Dean is a 72 y.o. 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 She presents to the ED with worsening lower extremity pain.    PT Comments    Pt in bed resting calmly.  Initially declined therapy stating she has been OOB for 30 minutes today and is tired.  Discussed with RN as hoyer pad in under her at this time and clearly has not been used yet for transfer.  RN stated they will attempt to get pt up when family arrives and is prepositioned as they do not typically stay long to visit so they can see pt's response to attempts to get her up.  Pt does tolerate LLE AAROM and does assist some with movements.  When RLE is attempted, after a few reps with limited ROM she begins screaming out in pain "IT HURTS!" And yells for me to help her.  She grabs my wrist and pulls me close begging me to help her.  I tell her that we can stop and I will talk with her nurse.  She quickly calms and when room is set up she thanks me and seems comfortable.  Discussed with RN.  OOB deferred and nurses plan to use Michiel Sites to get pt to chair when family does arrive.    Recommendations for follow up therapy are one component of a multi-disciplinary discharge planning process, led by the attending physician.  Recommendations may be updated based on patient status, additional functional criteria and insurance authorization.  Follow Up Recommendations       Assistance Recommended at Discharge Frequent or constant Supervision/Assistance  Patient can return home with the following Two people to help with walking and/or transfers;A lot of help with bathing/dressing/bathroom;Assistance with cooking/housework;Direct supervision/assist  for medications management;Direct supervision/assist for financial management;Assist for transportation;Help with stairs or ramp for entrance   Equipment Recommendations  None recommended by PT    Recommendations for Other Services       Precautions / Restrictions Precautions Precautions: Fall Precaution Comments: L chest port; L AV fistula Restrictions Weight Bearing Restrictions: No     Mobility  Bed Mobility                    Transfers                        Ambulation/Gait                   Stairs             Wheelchair Mobility    Modified Rankin (Stroke Patients Only)       Balance                                            Cognition Arousal/Alertness: Awake/alert Behavior During Therapy: Anxious, Impulsive Overall Cognitive Status: No family/caregiver present to determine baseline cognitive functioning                                          Exercises Other Exercises Other Exercises:  BLE AAROM - tolerates well L but screams in pain even with gentle ROM RLE with minimal ranges.    General Comments        Pertinent Vitals/Pain Pain Assessment Pain Assessment: 0-10 Faces Pain Scale: Hurts worst Pain Location: comfort at rest but begins screaming and crying with minimal  gentle ROM then once stopped and told her we would stop she seems calm and at peace again. Pain Descriptors / Indicators: Grimacing, Guarding, Crying Pain Intervention(s): Limited activity within patient's tolerance, Repositioned    Home Living                          Prior Function            PT Goals (current goals can now be found in the care plan section) Progress towards PT goals: Not progressing toward goals - comment    Frequency    Min 2X/week      PT Plan Current plan remains appropriate    Co-evaluation              AM-PAC PT "6 Clicks" Mobility   Outcome Measure   Help needed turning from your back to your side while in a flat bed without using bedrails?: A Lot Help needed moving from lying on your back to sitting on the side of a flat bed without using bedrails?: Total Help needed moving to and from a bed to a chair (including a wheelchair)?: Total Help needed standing up from a chair using your arms (e.g., wheelchair or bedside chair)?: Total Help needed to walk in hospital room?: Total Help needed climbing 3-5 steps with a railing? : Total 6 Click Score: 7    End of Session   Activity Tolerance: Patient limited by pain;Other (comment) Patient left: in bed;with call bell/phone within reach;with bed alarm set Nurse Communication: Mobility status;Precautions PT Visit Diagnosis: Unsteadiness on feet (R26.81);Other abnormalities of gait and mobility (R26.89);Muscle weakness (generalized) (M62.81);Difficulty in walking, not elsewhere classified (R26.2)     Time: 1324-4010 PT Time Calculation (min) (ACUTE ONLY): 8 min  Charges:  $Therapeutic Exercise: 8-22 mins                   Danielle Dess, PTA 11/06/22, 2:17 PM

## 2022-11-06 NOTE — Progress Notes (Signed)
[facility]  ROUNDING NOTE   Subjective:   Cynthia Dean is a 72 y.o. 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 She presents to the ED with worsening lower extremity pain. Patient will be admitted for Back pain [M54.9] Bilateral leg pain [M79.604, M79.605]  Update Patient seen laying in bed No family present Nursing at bedside preparing a.m. medications Per nursing, staff have attempted to place bed in chair position and, using lift, placed patient in room [place].  Patient complains of excessive pain during these trials, to the point where patient remains in lying position in bed.  Objective:  Vital signs in last 24 hours:  Temp:  [98 F (36.7 C)-98.5 F (36.9 C)] 98.3 F (36.8 C) (04/14 0622) Pulse Rate:  [68-72] 68 (04/14 0622) Resp:  [18-20] 20 (04/14 0622) BP: (107-133)/(55-101) 133/101 (04/14 0622) SpO2:  [93 %-100 %] 95 % (04/14 0622)  Weight change:  Filed Weights   11/02/22 1457 11/04/22 0845 11/04/22 1320  Weight: 90.5 kg 90.5 kg 89.9 kg    Intake/Output: I/O last 3 completed shifts: In: 240 [P.O.:240] Out: -    Intake/Output this shift:  No intake/output data recorded.  Physical Exam: General: NAD  Head: Normocephalic, atraumatic.  Moist oral mucosal membranes  Eyes: Anicteric  Lungs:  Clear to auscultation  Heart: Regular rate and rhythm  Abdomen:  Soft, nontender  Extremities: No peripheral edema.  Neurologic: Alert and oriented x 2, moving all 4 extremities  Skin: No lesions  Access: Left AVG    Basic Metabolic Panel: Recent Labs  Lab 10/31/22 0450 11/02/22 1052 11/03/22 0514 11/04/22 0302 11/05/22 0600 11/06/22 0541  NA 138 141 140 135 139 140  K 3.6 3.4* 3.5 3.4* 3.6 3.2*  CL 102 106 102 100 99 104  CO2 24 21* GLUCOSE 178* 174* 167* 158* 182* 158*  BUN 45* 59* 43* 55*  27* 34*  CREATININE 8.57* 10.74* 8.23* 9.68* 6.09* 6.97*  CALCIUM 9.5 9.1 9.6 9.4 9.3 8.7*  MG 1.7  --   --   --   --   --   PHOS  --  6.0*  --   --   --   --      Liver Function Tests: Recent Labs  Lab 11/02/22 1052  ALBUMIN 2.3*    No results for input(s): "LIPASE", "AMYLASE" in the last 168 hours. No results for input(s): "AMMONIA" in the last 168 hours.  CBC: Recent Labs  Lab 11/02/22 1052 11/03/22 0514 11/04/22 0302 11/05/22 0600 11/06/22 0541  WBC 7.0 8.1 7.4 8.3 7.2  NEUTROABS  --  4.6 3.7 5.0 3.7  HGB 9.3* 10.3* 9.9* 10.1* 9.7*  HCT 32.3* 35.0* 33.5* 33.8* 33.4*  MCV 84.1 81.4 80.7 81.3 82.1  PLT 210 204 202 194 189     Cardiac Enzymes: No results for input(s): "CKTOTAL", "CKMB", "CKMBINDEX", "TROPONINI" in the last 168 hours.  BNP: Invalid input(s): "POCBNP"  CBG: Recent Labs  Lab 10/31/22 0829 10/31/22 1152 11/01/22 0734  GLUCAP 149* 154* 148*     Microbiology: Results for orders placed or performed during the hospital encounter of 10/12/22  Blood culture (routine x 2)     Status: None   Collection Time: 10/12/22  5:40 PM   Specimen: BLOOD  Result Value Ref Range Status   Specimen Description BLOOD BLOOD RIGHT HAND [facility]  Final  Special Requests   Final    BOTTLES DRAWN AEROBIC AND ANAEROBIC Blood Culture results may not be optimal due to an inadequate volume of blood received in culture bottles   Culture   Final    NO GROWTH 5 DAYS Performed at [facility], [address] [place], [place] [address]    Report Status 10/17/2022 FINAL  Final  Blood culture (routine x 2)     Status: None   Collection Time: 10/12/22  6:56 PM   Specimen: BLOOD  Result Value Ref Range Status   Specimen Description BLOOD BLOOD RIGHT FOREARM  Final   Special Requests   Final    BOTTLES DRAWN AEROBIC ONLY Blood Culture results may not be optimal due to an inadequate volume of blood received in culture bottles   Culture   Final    NO GROWTH 5  DAYS Performed at [facility], [address], [place], [place] [address]    Report Status 10/17/2022 FINAL  Final  Aerobic/Anaerobic Culture w Gram Stain (surgical/deep wound)     Status: None   Collection Time: 10/13/22 11:44 AM   Specimen: Abscess  Result Value Ref Range Status   Specimen Description   Final    ABSCESS Performed at [facility], [address] [place], [place] [address]    Special Requests   Final    MUSCLE ABSCESS Performed at [facility], [address] [place], [place] [address]    Gram Stain   Final    ABUNDANT WBC PRESENT, PREDOMINANTLY PMN NO ORGANISMS SEEN    Culture   Final    FEW STAPHYLOCOCCUS CAPITIS NO ANAEROBES ISOLATED Performed at [facility], [address], [place], [place] [address]    Report Status 10/18/2022 FINAL  Final   Organism ID, Bacteria STAPHYLOCOCCUS CAPITIS  Final      Susceptibility   Staphylococcus capitis - MIC*    CIPROFLOXACIN <=0.5 SENSITIVE Sensitive     ERYTHROMYCIN >=8 RESISTANT Resistant     GENTAMICIN <=0.5 SENSITIVE Sensitive     OXACILLIN <=0.25 SENSITIVE Sensitive     TETRACYCLINE <=1 SENSITIVE Sensitive     VANCOMYCIN 1 SENSITIVE Sensitive     TRIMETH/SULFA <=10 SENSITIVE Sensitive     CLINDAMYCIN INTERMEDIATE Intermediate     RIFAMPIN <=0.5 SENSITIVE Sensitive     Inducible Clindamycin NEGATIVE Sensitive     * FEW STAPHYLOCOCCUS CAPITIS    Coagulation Studies: No results for input(s): "LABPROT", "INR" in the last 72 hours.  Urinalysis: No results for input(s): "COLORURINE", "LABSPEC", "PHURINE", "GLUCOSEU", "HGBUR", "BILIRUBINUR", "KETONESUR", "PROTEINUR", "UROBILINOGEN", "NITRITE", "LEUKOCYTESUR" in the last 72 hours.  Invalid input(s): "APPERANCEUR"    Imaging: No results found.   Medications:    anticoagulant sodium citrate     [START ON 11/07/2022]  ceFAZolin (ANCEF) IV      ceFAZolin (ANCEF) IV Stopped (11/02/22 1430)    ceFAZolin (ANCEF) IV  Stopped (11/04/22 1244)   methocarbamol (ROBAXIN) IV 110 mL/hr at 11/04/22 1530    (feeding supplement) PROSource Plus  30 mL Oral BID BM   Chlorhexidine Gluconate Cloth  6 each Topical Q0600   cinacalcet  30 mg Oral Q supper   feeding supplement (NEPRO CARB STEADY)  237 mL Oral TID BM   fentaNYL  1 patch Transdermal Q72H   gabapentin  100 mg Oral Daily   Gerhardt's butt cream   Topical QID   levETIRAcetam  1,000 mg Oral Daily   levETIRAcetam  250 mg Oral  Q M,W,F   metoprolol succinate  100 mg Oral Daily   nystatin  5 mL Oral QID   pantoprazole  20 mg Oral BID   potassium chloride  40 mEq Oral BID   psyllium  1 packet Oral Daily   QUEtiapine  50 mg Oral QHS   sertraline  25 mg Oral Daily   sodium chloride flush  10-40 mL Intracatheter Q12H   umeclidinium-vilanterol  1 puff Inhalation Daily   alteplase, anticoagulant sodium citrate, fentaNYL (SUBLIMAZE) injection, hydrALAZINE, lidocaine (PF), lidocaine-prilocaine, LORazepam, methocarbamol (ROBAXIN) IV, ondansetron, senna-docusate, sodium chloride flush  Assessment/ Plan:  [name] is a 72 y.o.  female with end stage renal disease on hemodialysis, coronary artery disease, COPD with intermittent home O2, hypertension, hyperlipidemia, stroke, diabetes mellitus type II, and breast cancer who was admitted to [facility] on 10/12/2022 for Back pain [M54.9] Bilateral leg pain [M79.604, M79.605]  Patient was found to have vertebral osteomyelitis and psoas abscess. Cultures positive for staph capitis  [facility] MWF Left AVG 102.5kg  End-stage renal disease on hemodialysis. Resumed on MWF schedule.    Next treatment scheduled for Monday.  Plan to sit patient in chair later this week for dialysis.  Currently optimistic of patient's tolerance of this.  2. Anemia of chronic kidney disease Normocytic Lab Results  Component Value Date   HGB 9.7 (L) 11/06/2022  Patient receives Mircera at outpatient clinic.   Hemoglobin at goal.  Will continue low-dose EPO.  3. Secondary Hyperparathyroidism: with outpatient labs: PTH 180 (3/11), phosphorus 5.4, calcium 9.3   Lab Results  Component Value Date   PTH 131 (H) 03/14/2018   CALCIUM 8.7 (L) 11/06/2022   CAION 1.09 (L) 08/28/2022   PHOS 6.0 (H) 11/02/2022   -Continue Cinacalcet. -Bone minerals acceptable at this time.  Will obtain updated phosphorus with labs tomorrow.  4.  Hypotension on hemodialysis treatment. Holding home regimen of losartan, metoprolol and torsemide. Blood pressure elevated this a.m.  Will continue to monitor.  5. Diabetes mellitus type II with chronic kidney disease/renal manifestations: insulin dependent.  Primary team will manage sliding scale insulin.  6. Vertebral osteomyelitis, seen on CT lumber spine. Neurosurgery feels patient is not a surgical candidate CT guided aspiration of psoas abscess on 10/13/22. Cultures positive for Staph Capitis.   -Infectious disease recommending cefazolin with dialysis treatments for 8-week course. - Will continue    LOS: 25   4/14/202410:14 AM

## 2022-11-06 NOTE — Progress Notes (Signed)
Assunta Gambles NT advised that the pt's position in the bed had changed for the head of the bed to be lowered; pt's friend, Unknown Jim assisting her with a late lunch

## 2022-11-07 DIAGNOSIS — M4646 Discitis, unspecified, lumbar region: Secondary | ICD-10-CM | POA: Diagnosis not present

## 2022-11-07 LAB — BASIC METABOLIC PANEL
Anion gap: 13 (ref 5–15)
BUN: 49 mg/dL — ABNORMAL HIGH (ref 8–23)
CO2: 26 mmol/L (ref 22–32)
Calcium: 9.7 mg/dL (ref 8.9–10.3)
Chloride: 103 mmol/L (ref 98–111)
Creatinine, Ser: 9.04 mg/dL — ABNORMAL HIGH (ref 0.44–1.00)
GFR, Estimated: 4 mL/min — ABNORMAL LOW (ref >60)
Glucose, Bld: 162 mg/dL — ABNORMAL HIGH (ref 70–99)
Potassium: 4.4 mmol/L (ref 3.5–5.1)
Sodium: 142 mmol/L (ref 135–145)

## 2022-11-07 LAB — CBC WITH DIFFERENTIAL/PLATELET
Abs Immature Granulocytes: 0.05 10*3/uL (ref 0.00–0.07)
Basophils Absolute: 0.1 10*3/uL (ref 0.0–0.1)
Basophils Relative: 1 %
Eosinophils Absolute: 0.2 10*3/uL (ref 0.0–0.5)
Eosinophils Relative: 4 %
HCT: 37.1 % (ref 36.0–46.0)
Hemoglobin: 10.8 g/dL — ABNORMAL LOW (ref 12.0–15.0)
Immature Granulocytes: 1 %
Lymphocytes Relative: 31 %
Lymphs Abs: 2 10*3/uL (ref 0.7–4.0)
MCH: 23.9 pg — ABNORMAL LOW (ref 26.0–34.0)
MCHC: 29.1 g/dL — ABNORMAL LOW (ref 30.0–36.0)
MCV: 82.1 fL (ref 80.0–100.0)
Monocytes Absolute: 0.7 10*3/uL (ref 0.1–1.0)
Monocytes Relative: 11 %
Neutro Abs: 3.5 10*3/uL (ref 1.7–7.7)
Neutrophils Relative %: 52 %
Platelets: 180 10*3/uL (ref 150–400)
RBC: 4.52 MIL/uL (ref 3.87–5.11)
RDW: 21.4 % — ABNORMAL HIGH (ref 11.5–15.5)
WBC: 6.6 10*3/uL (ref 4.0–10.5)
nRBC: 0 % (ref 0.0–0.2)

## 2022-11-07 LAB — PHOSPHORUS: Phosphorus: 6.4 mg/dL — ABNORMAL HIGH (ref 2.5–4.6)

## 2022-11-07 LAB — MAGNESIUM: Magnesium: 1.8 mg/dL (ref 1.7–2.4)

## 2022-11-07 MED ORDER — CEFAZOLIN SODIUM-DEXTROSE 2-4 GM/100ML-% IV SOLN
2.0000 g | Freq: Once | INTRAVENOUS | Status: AC
  Administered 2022-11-07: 2 g via INTRAVENOUS
  Filled 2022-11-07: qty 100

## 2022-11-07 NOTE — Progress Notes (Signed)
arterial pressure high between 250-300. WIll change the HD Circuit as AVF access was patent upon checking. Rinseback done successfully.

## 2022-11-07 NOTE — Progress Notes (Signed)
Progress Note   [name] KZS:[id number] DOB: Jun 20, 1951 DOA: 10/12/2022     26 DOS: the patient was seen and examined on 11/07/2022    Subjective:  Patient seen and examined at bedside this morning   Nephrology team planning to have patient sit in the recliner for dialysis this week   Denied nausea vomiting chest pain or cough   Brief hospital course:   Tynia Allise Ariel is a 72 y/o F w/ PMH of ESRD on HD, COPD, DM2, HTN, depression, CVA who presented w/ back pain x 3 weeks.  Pt presented w/ back pain and was found to intradiscal abscess/lumbar discitis/b/l psoas abscess on MRI status post IR guided drainage. Patient has had waxing and waning mental status with intermittently refusing dialysis. Patient's family have decided to continue hemodialysis after speaking with their PCP.     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  Wound cx growing staph capitis.  ID consulted.  Abx de-escalated to cefazolin.  Not a good surgical candidate as per neuro surg.   Pt having severe low back and bilateral leg pain but improving --cont cefazolin, for 6 weeks, End Date: 12/02/22 --pain management -- trial Norco, given ESRD will use IV fentanyl PRN and before dialysis  Fentanyl patch have been added to improve patient's back pain  Nephrology team planning to have patient sit in the recliner for dialysis this week   Confusion and agitation Hospital delirium --Pt was agitated, and also not responding to questions or commands during early hospitalization.  Mental status did start to improve, and now pt is calm and interactive, however, at this time she has been having waxing and waning mental status --cont seroquel 50 mg nightly to help with delirium and sleep.     ESRD: on HD  MWF.  Pt refused HD on 10/17/22, 10/18/22 and 3/27, 4/1.   Since then, has been agreeable to dialysis without further issues, getting dialysis in hospital bed. When attempts made to do HD in chair, she did not tolerate on account of severe back pain Fentanyl patch have been added to improve patient's back pain This is helping with back pain   Generalized weakness:  PT recs SNF. TOC following.   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 H&H are stable    Hypokalemia:-Improved --monitor and replete PRN    Peripheral neuropathy --  4/4 - Cautious trial of gabapentin 100 mg daily (ESRD - monitor closely for side effects) 4% - Pt notes significant improvement and no side effects so far --Continue low dose gabapentin and monitor   Hx of seizures:  continue on home dose of Keppra   HLD:  continue on statin    Hx of COPD: w/o exacerbation.  Continue on bronchodilators    DM2: well controlled HbA1c 5.2.  --d/c'ed BG checks    HTN:  --Toprol XL resumed --Continue holding losartan    GERD:  continue on PPI    Depression: severity unknown.  Continue on home dose of sertraline     Overweight: BMI 28.2.  Wt loss efforts recommended   Right foot pain - see peripheral neuropathy     DVT prophylaxis: Heparin SQ   Code Status: DNR this has been confirmed by me from patient's daughter [name]   Family Communication: Updated daughter/POA,  [name], over the phone today   Data Reviewed: I have reviewed patient's BMP and CBC sodium level 148 potassium 3.2     Disposition: Status is: Inpatient Patient still meets criteria for inpatient need given IV antibiotics As well as unsafe discharge without dialysis   Time spent: 38 minutes Consultants - Nephrology, Infectious Disease, Palliative Care   Level of care: Telemetry Medical      Physical Exam:   General exam: awake alert, no acute distress  Respiratory system: on room air, normal respiratory effort. Cardiovascular system: RRR,  no pedal edema Gastrointestinal system: soft, NT, ND  Central nervous system: A&O x3. no gross focal neurologic deficits Extremities:  LUE fistula noted, no edema, normal tone Skin: dry, intact, normal temperature Psychiatry: Good mood, congruent affect, judgment and insight intact       Vitals:   11/07/22 1000 11/07/22 1058 11/07/22 1131 11/07/22 1134  BP: 114/64 131/62 118/64   Pulse: 71 71 73   Resp: 12 12 11    Temp:   98.2 F (36.8 C)   TempSrc:   Oral   SpO2: 98% 99% 96%   Weight:    90.3 kg  Height:         Author: Loyce Dys, MD 11/07/2022 2:28 PM  For on call review [email or web address].

## 2022-11-07 NOTE — Progress Notes (Signed)
[facility]  ROUNDING NOTE   Subjective:   Cynthia Dean is a 72 y.o. 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 She presents to the ED with worsening lower extremity pain. Patient will be admitted for Back pain [M54.9] Bilateral leg pain [M79.604, M79.605]  Update Patient seen and evaluated during dialysis   HEMODIALYSIS FLOWSHEET:  Blood Flow Rate (mL/min): 350 mL/min Arterial Pressure (mmHg): -200 mmHg Venous Pressure (mmHg): 250 mmHg TMP (mmHg): 15 mmHg Ultrafiltration Rate (mL/min): 779 mL/min Dialysate Flow Rate (mL/min): 300 ml/min Dialysis Fluid Bolus: Normal Saline Bolus Amount (mL): 100 mL (uf still off)  Tolerating treatment well in bed Denies pain   Objective:  Vital signs in last 24 hours:  Temp:  [97.9 F (36.6 C)-98.3 F (36.8 C)] 98.3 F (36.8 C) (04/15 0744) Pulse Rate:  [66-73] 71 (04/15 1000) Resp:  [12-18] 12 (04/15 1000) BP: (101-148)/(58-69) 114/64 (04/15 1000) SpO2:  [93 %-100 %] 98 % (04/15 1000) Weight:  [90.4 kg] 90.4 kg (04/15 0753)  Weight change:  Filed Weights   11/04/22 0845 11/04/22 1320 11/07/22 0753  Weight: 90.5 kg 89.9 kg 90.4 kg    Intake/Output: I/O last 3 completed shifts: In: 480 [P.O.:480] Out: -    Intake/Output this shift:  No intake/output data recorded.  Physical Exam: General: NAD  Head: Normocephalic, atraumatic.  Moist oral mucosal membranes  Eyes: Anicteric  Lungs:  Clear to auscultation  Heart: Regular rate and rhythm  Abdomen:  Soft, nontender  Extremities: No peripheral edema.  Neurologic: Alert and oriented x 2, moving all 4 extremities  Skin: No lesions  Access: Left AVG    Basic Metabolic Panel: Recent Labs  Lab 11/02/22 1052 11/03/22 0514 11/04/22 0302 11/05/22 0600 11/06/22 0541 11/07/22 0610  NA 141 140 135 139 140 142  K 3.4* 3.5  3.4* 3.6 3.2* 4.4  CL 106 102 100 99 104 103  CO2 21* GLUCOSE 174* 167* 158* 182* 158* 162*  BUN 59* 43* 55* 27* 34* 49*  CREATININE 10.74* 8.23* 9.68* 6.09* 6.97* 9.04*  CALCIUM 9.1 9.6 9.4 9.3 8.7* 9.7  MG  --   --   --   --   --  1.8  PHOS 6.0*  --   --   --   --   --      Liver Function Tests: Recent Labs  Lab 11/02/22 1052  ALBUMIN 2.3*    No results for input(s): "LIPASE", "AMYLASE" in the last 168 hours. No results for input(s): "AMMONIA" in the last 168 hours.  CBC: Recent Labs  Lab 11/03/22 0514 11/04/22 0302 11/05/22 0600 11/06/22 0541 11/07/22 0610  WBC 8.1 7.4 8.3 7.2 6.6  NEUTROABS 4.6 3.7 5.0 3.7 3.5  HGB 10.3* 9.9* 10.1* 9.7* 10.8*  HCT 35.0* 33.5* 33.8* 33.4* 37.1  MCV 81.4 80.7 81.3 82.1 82.1  PLT 204 202 194 189 180     Cardiac Enzymes: No results for input(s): "CKTOTAL", "CKMB", "CKMBINDEX", "TROPONINI" in the last 168 hours.  BNP: Invalid input(s): "POCBNP"  CBG: Recent Labs  Lab 10/31/22 1152 11/01/22 0734  GLUCAP 154* 148*     Microbiology: Results for orders placed or performed during the hospital encounter of 10/12/22  Blood culture (routine x 2)     Status: None   Collection Time: 10/12/22  5:40 PM   Specimen: BLOOD  Result Value Ref Range  Status   Specimen Description BLOOD BLOOD RIGHT HAND [facility]  Final   Special Requests   Final    BOTTLES DRAWN AEROBIC AND ANAEROBIC Blood Culture results may not be optimal due to an inadequate volume of blood received in culture bottles   Culture   Final    NO GROWTH 5 DAYS Performed at [facility], [address] [place], [place] [address]    Report Status 10/17/2022 FINAL  Final  Blood culture (routine x 2)     Status: None   Collection Time: 10/12/22  6:56 PM   Specimen: BLOOD  Result Value Ref Range Status   Specimen Description BLOOD BLOOD RIGHT FOREARM  Final   Special Requests   Final    BOTTLES DRAWN AEROBIC ONLY Blood Culture results may not be  optimal due to an inadequate volume of blood received in culture bottles   Culture   Final    NO GROWTH 5 DAYS Performed at [facility], [address] [place], [place] [address]    Report Status 10/17/2022 FINAL  Final  Aerobic/Anaerobic Culture w Gram Stain (surgical/deep wound)     Status: None   Collection Time: 10/13/22 11:44 AM   Specimen: Abscess  Result Value Ref Range Status   Specimen Description   Final    ABSCESS Performed at [facility], [address] [place], [place] [address]    Special Requests   Final    MUSCLE ABSCESS Performed at [facility], [address] [place], [place] [address]    Gram Stain   Final    ABUNDANT WBC PRESENT, PREDOMINANTLY PMN NO ORGANISMS SEEN    Culture   Final    FEW STAPHYLOCOCCUS CAPITIS NO ANAEROBES ISOLATED Performed at [facility], [address], [place], [place] [address]    Report Status 10/18/2022 FINAL  Final   Organism ID, Bacteria STAPHYLOCOCCUS CAPITIS  Final      Susceptibility   Staphylococcus capitis - MIC*    CIPROFLOXACIN <=0.5 SENSITIVE Sensitive     ERYTHROMYCIN >=8 RESISTANT Resistant     GENTAMICIN <=0.5 SENSITIVE Sensitive     OXACILLIN <=0.25 SENSITIVE Sensitive     TETRACYCLINE <=1 SENSITIVE Sensitive     VANCOMYCIN 1 SENSITIVE Sensitive     TRIMETH/SULFA <=10 SENSITIVE Sensitive     CLINDAMYCIN INTERMEDIATE Intermediate     RIFAMPIN <=0.5 SENSITIVE Sensitive     Inducible Clindamycin NEGATIVE Sensitive     * FEW STAPHYLOCOCCUS CAPITIS    Coagulation Studies: No results for input(s): "LABPROT", "INR" in the last 72 hours.  Urinalysis: No results for input(s): "COLORURINE", "LABSPEC", "PHURINE", "GLUCOSEU", "HGBUR", "BILIRUBINUR", "KETONESUR", "PROTEINUR", "UROBILINOGEN", "NITRITE", "LEUKOCYTESUR" in the last 72 hours.  Invalid input(s): "APPERANCEUR"    Imaging: No results found.   Medications:    anticoagulant sodium citrate      ceFAZolin  (ANCEF) IV      ceFAZolin (ANCEF) IV Stopped (11/02/22 1430)    ceFAZolin (ANCEF) IV Stopped (11/04/22 1244)   methocarbamol (ROBAXIN) IV 110 mL/hr at 11/04/22 1530    (feeding supplement) PROSource Plus  30 mL Oral BID BM   Chlorhexidine Gluconate Cloth  6 each Topical Q0600   cinacalcet  30 mg Oral Q supper   feeding supplement (NEPRO CARB STEADY)  237 mL Oral TID BM   fentaNYL  1 patch Transdermal Q72H   gabapentin  100 mg Oral Daily   Gerhardt's butt cream   Topical QID   levETIRAcetam  1,000 mg Oral Daily   levETIRAcetam  250 mg Oral Q M,W,F   metoprolol succinate  100 mg Oral Daily   nystatin  5 mL Oral QID   pantoprazole  20 mg Oral BID   psyllium  1 packet Oral Daily   QUEtiapine  50 mg Oral QHS   sertraline  25 mg Oral Daily   sodium chloride flush  10-40 mL Intracatheter Q12H   umeclidinium-vilanterol  1 puff Inhalation Daily   alteplase, anticoagulant sodium citrate, fentaNYL (SUBLIMAZE) injection, hydrALAZINE, lidocaine (PF), lidocaine-prilocaine, LORazepam, methocarbamol (ROBAXIN) IV, ondansetron, senna-docusate, sodium chloride flush  Assessment/ Plan:  Ms. Niesa Derstine is a 72 y.o.  female with end stage renal disease on hemodialysis, coronary artery disease, COPD with intermittent home O2, hypertension, hyperlipidemia, stroke, diabetes mellitus type II, and breast cancer who was admitted to [facility] on 10/12/2022 for Back pain [M54.9] Bilateral leg pain [M79.604, M79.605]  Patient was found to have vertebral osteomyelitis and psoas abscess. Cultures positive for staph capitis  [facility] MWF Left AVG 102.5kg  End-stage renal disease on hemodialysis. Resumed on MWF schedule.    Receiving dialysis today, UF goal 1.5L as tolerated. Next treatment scheduled for Wednesday.  Plans to attempt to sit patient in recliner later this week for dialysis. Appears attempts to sit patient in room have failed, due to pain.   2. Anemia of chronic kidney  disease Normocytic Lab Results  Component Value Date   HGB 10.8 (L) 11/07/2022  Patient receives Mircera at outpatient clinic.  Hemoglobin within target  Will continue low-dose EPO.  3. Secondary Hyperparathyroidism: with outpatient labs: PTH 180 (3/11), phosphorus 5.4, calcium 9.3   Lab Results  Component Value Date   PTH 131 (H) 03/14/2018   CALCIUM 9.7 11/07/2022   CAION 1.09 (L) 08/28/2022   PHOS 6.0 (H) 11/02/2022   -Continue Cinacalcet. -Bone minerals acceptable at this time.  Awaiting phosphorus   4.  Hypotension on hemodialysis treatment. Holding home regimen of losartan, metoprolol and torsemide. Blood pressure 101/63 during dialysis.   5. Diabetes mellitus type II with chronic kidney disease/renal manifestations: insulin dependent.  Primary team will manage sliding scale insulin.  6. Vertebral osteomyelitis, seen on CT lumber spine. Neurosurgery feels patient is not a surgical candidate CT guided aspiration of psoas abscess on 10/13/22. Cultures positive for Staph Capitis.   -Infectious disease recommending cefazolin with dialysis treatments for 8-week course.     LOS: 26   4/15/202410:33 AM

## 2022-11-07 NOTE — Plan of Care (Signed)

## 2022-11-07 NOTE — Procedures (Signed)
Cynthia Beavers, NP made aware that patient's access is not working well. BFR was  200 and Venous pressure was at 300-400. Patient's circuit was already changed. Repositioned the arm and needle several times but did not work. Check venous access and thrill and bruit were barely noted in the area. Malachi Carl, NP ordered to rinseback.

## 2022-11-07 NOTE — Progress Notes (Signed)
Received patient in bed to unit.  Alert and oriented.  Informed consent signed and in chart.   TX duration: 2  Patient tolerated well.  Transported back to the room  Alert, without acute distress.  Hand-off given to patient's nurse.   Access used: Left upper arm AVG. Access issues: Venous pressure high despite interventions. Shantelle Breeze,NP was made aware.   Total UF removed: 300 ml. Medication(s) given: NONE    [facility]

## 2022-11-08 DIAGNOSIS — M4646 Discitis, unspecified, lumbar region: Secondary | ICD-10-CM | POA: Diagnosis not present

## 2022-11-08 DIAGNOSIS — Z7189 Other specified counseling: Secondary | ICD-10-CM | POA: Diagnosis not present

## 2022-11-08 LAB — CBC WITH DIFFERENTIAL/PLATELET
Abs Immature Granulocytes: 0.05 10*3/uL (ref 0.00–0.07)
Basophils Absolute: 0.1 10*3/uL (ref 0.0–0.1)
Basophils Relative: 1 %
Eosinophils Absolute: 0.2 10*3/uL (ref 0.0–0.5)
Eosinophils Relative: 3 %
HCT: 32.4 % — ABNORMAL LOW (ref 36.0–46.0)
Hemoglobin: 9.6 g/dL — ABNORMAL LOW (ref 12.0–15.0)
Immature Granulocytes: 1 %
Lymphocytes Relative: 37 %
Lymphs Abs: 2.6 10*3/uL (ref 0.7–4.0)
MCH: 24.2 pg — ABNORMAL LOW (ref 26.0–34.0)
MCHC: 29.6 g/dL — ABNORMAL LOW (ref 30.0–36.0)
MCV: 81.6 fL (ref 80.0–100.0)
Monocytes Absolute: 0.7 10*3/uL (ref 0.1–1.0)
Monocytes Relative: 10 %
Neutro Abs: 3.5 10*3/uL (ref 1.7–7.7)
Neutrophils Relative %: 48 %
Platelets: 143 10*3/uL — ABNORMAL LOW (ref 150–400)
RBC: 3.97 MIL/uL (ref 3.87–5.11)
RDW: 21.2 % — ABNORMAL HIGH (ref 11.5–15.5)
Smear Review: NORMAL
WBC: 7.1 10*3/uL (ref 4.0–10.5)
nRBC: 0 % (ref 0.0–0.2)

## 2022-11-08 LAB — BASIC METABOLIC PANEL WITH GFR
Anion gap: 11 (ref 5–15)
BUN: 40 mg/dL — ABNORMAL HIGH (ref 8–23)
CO2: 29 mmol/L (ref 22–32)
Calcium: 9 mg/dL (ref 8.9–10.3)
Chloride: 101 mmol/L (ref 98–111)
Creatinine, Ser: 7.53 mg/dL — ABNORMAL HIGH (ref 0.44–1.00)
GFR, Estimated: 5 mL/min — ABNORMAL LOW
Glucose, Bld: 152 mg/dL — ABNORMAL HIGH (ref 70–99)
Potassium: 3.8 mmol/L (ref 3.5–5.1)
Sodium: 141 mmol/L (ref 135–145)

## 2022-11-08 NOTE — Progress Notes (Signed)
PT Cancellation Note  Patient Details Name: Cynthia Dean MRN: [id number] DOB: Jul 22, 1951   Cancelled Treatment:    Reason Eval/Treat Not Completed: Other (comment)  Session attempted this am.  Daughter [name] in room.  Pt refusing session "I want you both to leave me alone."  Attempted to remove covers for BLE ex but she continues to refuse.  Ignoring questions at times.  Daughter reports she is "usually not like this"  When asked daughter, requests I leave her be for now and will try to attempt again later this am.     Danielle Dess 11/08/2022, 8:34 AM

## 2022-11-08 NOTE — Progress Notes (Signed)
Nutrition Follow-up  DOCUMENTATION CODES:   Not applicable  INTERVENTION:   -Continue liberalized diet of regular -Continue renal MVI daily -Continue 30 ml Prosource Plus BID, each supplement provides 100 kcals and 15 grams protein -Continue Nepro Shake po TID, each supplement provides 425 kcal and 19 grams protein   NUTRITION DIAGNOSIS:   Increased nutrient needs related to chronic illness (ESRD on HD) as evidenced by estimated needs.  Ongoing  GOAL:   Patient will meet greater than or equal to 90% of their needs  Progressing  MONITOR:   PO intake, Supplement acceptance  REASON FOR ASSESSMENT:   Malnutrition Screening Tool    ASSESSMENT:   Pt with PMH of ESRD on HD, HLD, COPD, DM2, HTN, depression, CVA who presents with back pain x 3 weeks PTA.  Reviewed I/O's: -215 ml x 24 hours and -3.3 L since 10/25/22   Last HD treatment on 11/07/22. Fentanyl patches have been helping with pt's back pain.   Pt sleeping soundly at time of visit. No family at bedside. RD did not disturb due to pt's resistant to treatments today.   Pt with improved oral intake. Noted meal completions 50-100%. Pt with variable acceptance of supplements.   Wt has been stable over the past 5 days.   Palliative care following for goals of care. Plan for full care and continues HD.   Medications reviewed and include sensipar, keppra, and metamucil.  Labs reviewed: CBGS: 148. Phos: 6.4.   Diet Order:   Diet Order             Diet regular Room service appropriate? Yes; Fluid consistency: Thin  Diet effective now                   EDUCATION NEEDS:   No education needs have been identified at this time  Skin:  Skin Assessment: Skin Integrity Issues: Skin Integrity Issues:: Other (Comment) Other: IAD to lt buttocks  Last BM:  11/07/22  Height:   Ht Readings from Last 1 Encounters:  10/12/22 6' (1.829 m)    Weight:   Wt Readings from Last 1 Encounters:  11/07/22 90.3 kg     Ideal Body Weight:  72.7 kg  BMI:  Body mass index is 27 kg/m.  Estimated Nutritional Needs:   Kcal:  2150-2350  Protein:  105-120 grams  Fluid:  1000 ml + UOP    Levada Schilling, RD, LDN, CDCES Registered Dietitian II Certified Diabetes Care and Education Specialist Please refer to [facility] for RD and/or RD on-call/weekend/after hours pager

## 2022-11-08 NOTE — Progress Notes (Signed)
OT Cancellation Note  Patient Details Name: Cynthia Dean MRN: [id number] DOB: [birth year]   Cancelled Treatment:    Reason Eval/Treat Not Completed: Patient declined, no reason specified. Upon attempt with PT for co-tx, pt declining despite max encouragement and education in benefits of movement. Will re-attempt at later date/time.  Arman Filter., MPH, MS, OTR/L ascom [phone] 11/08/22, 11:32 AM

## 2022-11-08 NOTE — Progress Notes (Signed)
Daily Progress Note   Patient Name: Cynthia Dean       Date: 11/08/2022 DOB: 1950/12/16  Age: 72 y.o. MRN#: [id number] Attending Physician: Loyce Dys, MD Primary Care Physician: Loistine Chance, MD Admit Date: 10/12/2022  Reason for Consultation/Follow-up: Establishing goals of care  Subjective: Notes and labs reviewed.  In to see patient.  Patient is resting flat in bed with covers pulled up to her neck, sleeping.  Patient awakened to voice.  Patient looked at me, and said "okay" when asked how she was doing.  She continued to look at me as I spoke, but did not say anything further.  PMT will follow-up tomorrow to see if she is more alert.  Length of Stay: 27  Current Medications: Scheduled Meds:   (feeding supplement) PROSource Plus  30 mL Oral BID BM   Chlorhexidine Gluconate Cloth  6 each Topical Q0600   cinacalcet  30 mg Oral Q supper   feeding supplement (NEPRO CARB STEADY)  237 mL Oral TID BM   fentaNYL  1 patch Transdermal Q72H   gabapentin  100 mg Oral Daily   Gerhardt's butt cream   Topical QID   levETIRAcetam  1,000 mg Oral Daily   levETIRAcetam  250 mg Oral Q M,W,F   metoprolol succinate  100 mg Oral Daily   nystatin  5 mL Oral QID   pantoprazole  20 mg Oral BID   psyllium  1 packet Oral Daily   QUEtiapine  50 mg Oral QHS   sertraline  25 mg Oral Daily   sodium chloride flush  10-40 mL Intracatheter Q12H   umeclidinium-vilanterol  1 puff Inhalation Daily    Continuous Infusions:  anticoagulant sodium citrate      ceFAZolin (ANCEF) IV      ceFAZolin (ANCEF) IV Stopped (11/02/22 1430)    ceFAZolin (ANCEF) IV Stopped (11/04/22 1244)   methocarbamol (ROBAXIN) IV 110 mL/hr at 11/08/22 0447    PRN Meds: alteplase, anticoagulant sodium citrate,  fentaNYL (SUBLIMAZE) injection, hydrALAZINE, lidocaine (PF), lidocaine-prilocaine, LORazepam, methocarbamol (ROBAXIN) IV, ondansetron, senna-docusate, sodium chloride flush  Physical Exam Constitutional:      Comments: Opens eyes to voice  Pulmonary:     Effort: Pulmonary effort is normal.             Vital Signs: BP [name])  102/44 (BP Location: Right Arm)   Pulse 66   Temp 98.7 F (37.1 C)   Resp 20   Ht 6' (1.829 m)   Wt 90.3 kg   SpO2 91%   BMI 27.00 kg/m  SpO2: SpO2: 91 % O2 Device: O2 Device: Room Air O2 Flow Rate: O2 Flow Rate (L/min): 3 L/min  Intake/output summary:  Intake/Output Summary (Last 24 hours) at 11/08/2022 1154 Last data filed at 11/08/2022 1030 Gross per 24 hour  Intake 84.78 ml  Output --  Net 84.78 ml   LBM: Last BM Date : 11/07/22 Baseline Weight: Weight: 113.4 kg Most recent weight: Weight: 90.3 kg        Patient Active Problem List   Diagnosis Date Noted   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/17/2022   Back pain 10/12/2022   Discitis of lumbar region 10/12/2022   Myositis 10/12/2022   Hemoglobin drop 08/28/2022   Chronic respiratory failure with hypoxia 08/26/2022   Acute on chronic blood loss anemia 08/26/2022   History of lower GI bleeding 08/26/2022   Insulin-requiring or dependent type II diabetes mellitus 08/26/2022   Chronic anticoagulation 08/26/2022   History of pulmonary embolism 03/20/2022 08/26/2022   Breast cancer, left S/P partial mastectomy,03/04/2022 08/26/2022   Generalized weakness    Unable to ambulate 05/09/2022   ESRD on dialysis    Bilateral leg pain    Depression    Pulmonary emboli    Obesity with body mass index (BMI) of 30.0 to 39.9    Anemia    Chronic diastolic CHF (congestive heart failure)    Seroma of breast    Bacteremia due to coagulase-negative Staphylococcus    Left breast abscess    Urinary tract infection 03/22/2022   Anemia of chronic disease 03/22/2022   Acute pulmonary embolism without acute cor pulmonale  03/20/2022   Dyslipidemia 03/20/2022   Diabetic neuropathy 03/20/2022   Sepsis due to cellulitis 03/20/2022   Delay kidney tx func d/t fluid overload requiring acute dialysis 01/29/2020   Delay kidney tx func d/t ATN and fluid overload require acute dialysis 01/29/2020   Hypoglycemia 10/21/2019   Unresponsiveness 10/21/2019   GERD (gastroesophageal reflux disease) 10/21/2019   Hypothermia    Sepsis with encephalopathy without septic shock    Diarrhea    Acute metabolic encephalopathy    Acute on chronic diastolic CHF (congestive heart failure)    Acute on chronic respiratory failure with hypoxia 07/30/2019   Rectal bleed 02/13/2019   Acute respiratory failure with hypoxia 12/31/2018   History of CVA (cerebrovascular accident) 05/29/2018   Aphasia as late effect of cerebrovascular accident (CVA) 05/01/2018   Seizure disorder 03/14/2018   Stroke 03/07/2018   Slurred speech 11/14/2017   Type 2 diabetes mellitus with ESRD (end-stage renal disease) 11/12/2017   CAD (coronary artery disease) 11/12/2017   COPD (chronic obstructive pulmonary disease) 11/12/2017   ESRD on hemodialysis 11/12/2017   CVA (cerebral vascular accident) 04/14/2016   Essential hypertension 11/27/2015   Hyperlipidemia 11/27/2015   Prolonged Q-T interval on ECG 11/26/2015   Aphasia complicating stroke 11/05/2015   Cervical spine arthritis 07/27/2015   Diabetic retinopathy without macular edema associated with type 2 diabetes mellitus 07/27/2015   Osteoarthritis of spine with radiculopathy, lumbar region 07/27/2015   Obesity, Class III, BMI 40-49.9 (morbid obesity) 07/27/2015   Vitamin D deficiency 01/21/2011   Diastolic heart failure 10/27/2010    Palliative Care Assessment & Plan    Recommendations/Plan: PMT will follow.   Code Status:    Code  Status Orders  (From admission, onward)           Start     Ordered   11/01/22 1535  Do not attempt resuscitation (DNR)  Continuous       Question  Answer Comment  If patient has no pulse and is not breathing Do Not Attempt Resuscitation   If patient has a pulse and/or is breathing: Medical Treatment Goals COMFORT MEASURES: Keep clean/warm/dry, use medication by any route; positioning, wound care and other measures to relieve pain/suffering; use oxygen, suction/manual treatment of airway obstruction for comfort; do not transfer unless for comfort needs.   Consent: Discussion documented in EHR or advanced directives reviewed      11/01/22 1534           Code Status History     Date Active Date Inactive Code Status Order ID Comments User Context   10/12/2022 1101 11/01/2022 1534 Full Code [id number]  Charise Killian, MD Inpatient   08/26/2022 2018 09/09/2022 2244 Full Code [id number]  Andris Baumann, MD ED   05/09/2022 0919 05/17/2022 0109 Full Code [id number]  Lorretta Harp, MD ED   03/20/2022 0121 04/02/2022 0126 Full Code [id number]  Mansy, Vernetta Honey, MD ED   01/29/2020 2038 01/30/2020 2215 Full Code [id number]  Lucile Shutters, MD Inpatient   01/29/2020 1210 01/29/2020 2038 Full Code [id number]  Lucile Shutters, MD Inpatient   10/21/2019 0902 10/25/2019 2211 Full Code [id number]  Lorretta Harp, MD ED   07/30/2019 2224 08/11/2019 2130 Full Code [id number]  Mansy, Vernetta Honey, MD ED   02/13/2019 1329 02/16/2019 1849 Full Code [id number]  Jimmye Norman, NP ED   12/31/2018 1159 01/02/2019 1330 Full Code [id number]  Jimmye Norman, NP ED   05/29/2018 1541 05/30/2018 2041 Full Code [id number]  Alford Highland, MD ED   03/14/2018 0141 03/15/2018 0336 Full Code [id number]  Cammy Copa, MD Inpatient   03/08/2018 0005 03/09/2018 2108 Full Code [id number]  Altamese Dilling, MD Inpatient   11/15/2017 0017 11/17/2017 1809 Full Code [id number]  Oralia Manis, MD ED   11/12/2017 2219 11/13/2017 1935 Full Code [id number]  Oralia Manis, MD ED   04/14/2016 1330 04/14/2016 1700 Full Code [id number]  Adrian Saran, MD ED   08/08/2015 2257 08/09/2015 2054 Full Code [id number]  Wyatt Haste, MD ED   04/17/2015 1334 04/17/2015 1836 Full Code [id number]  Annice Needy, MD Inpatient      Advance Directive Documentation    Flowsheet Row Most Recent Value  Type of Advance Directive Healthcare Power of Attorney  Pre-existing out of facility DNR order (yellow form or pink MOST form) --  "MOST" Form in Place? --    Thank you for allowing the Palliative Medicine Team to assist in the care of this patient.    Morton Stall, NP  Please contact Palliative Medicine Team phone at [phone] for questions and concerns.

## 2022-11-08 NOTE — Consult Note (Signed)
Pharmacy Antibiotic Note  Cynthia Dean is a 72 y.o. female admitted on 10/12/2022 with  discitis and bilateral psoas abscess .  Pharmacy has been consulted for cefazolin dosing. Of note, patient receives hemodialysis MWF outpatient. Plan is to continue regimen while inpatient. IR aspiration of abscess on 3/21 with culture growing Methicillin-susceptible S. Capitis.   Today, 11/08/2022 Day #27 antibiotics Renal: ESRD on HD MWF WBC WNL Afebrile Psoas abscess cx: Methicillin-susc S capitis  Plan Currently on cefazolin 2g IV with HD on Mon/Wed and 3gm IV with HD on Fri. Patient not able to complete full HD 4/15 due to increased pressures and access not working well.  Cefazolin was no given in HD clinic - sent bag to floor and given by floor RN 4/15 OPAT completed for 2g-2g-3g - needs to f/u plans for HD  Height: 6' (182.9 cm) Weight: 90.3 kg (199 lb 1.2 oz) IBW/kg (Calculated) : 73.1  Temp (24hrs), Avg:98.5 F (36.9 C), Min:98 F (36.7 C), Max:98.7 F (37.1 C)  Recent Labs  Lab 11/04/22 0302 11/05/22 0600 11/06/22 0541 11/07/22 0610 11/08/22 0430  WBC 7.4 8.3 7.2 6.6 7.1  CREATININE 9.68* 6.09* 6.97* 9.04* 7.53*     Estimated Creatinine Clearance: 8.7 mL/min (A) (by C-G formula based on SCr of 7.53 mg/dL (H)).    Allergies  Allergen Reactions   Oxycodone Nausea And Vomiting   Oxycodone-Acetaminophen Other (See Comments)   Wound Dressing Adhesive    Hydrocodone     Intolerant more than allergic   Tape Itching    Skin Dermatitis/itching (tape adhesive) Skin Dermatitis/itching (tape adhesive)   Tapentadol Itching    Skin Dermatitis/itching (tape adhesive) Skin Dermatitis/itching (tape adhesive)     Antimicrobials this admission: Vancomycin 3/21 >> 3/26 Cefepime 3/21 >> 3/25 Cefazolin 3/26 >>  Dose adjustments this admission: ESRD  Microbiology results: 3/20 BCx: NGTD 3/21 Abscess cx: meth-susc Staph capitis  Thank you for allowing pharmacy to be a part  of this patient's care.  Cynthia Dean, PharmD, BCPS, BCIDP Work Cell: [phone] 11/08/2022 1:18 PM

## 2022-11-08 NOTE — TOC Progression Note (Addendum)
Transition of Care [facility]) - Progression Note    Patient Details  Name: Cynthia Dean MRN: [id number] Date of Birth: [birth year]  [facility] CM/SW Contact  Allena Katz, LCSW Phone Number: 11/08/2022, 9:33 AM  Clinical Narrative:   CSW following patient only oriented to self at this time. Pt attempted dialysis but unable to complete on 4/15.          Expected Discharge Plan and Services                                               Social Determinants of Health (SDOH) Interventions SDOH Screenings   Food Insecurity: No Food Insecurity (10/12/2022)  Housing: Low Risk  (10/12/2022)  Transportation Needs: No Transportation Needs (10/12/2022)  Utilities: Not At Risk (10/12/2022)  Financial Resource Strain: Low Risk  (11/12/2017)  Physical Activity: Insufficiently Active (11/12/2017)  Social Connections: Moderately Integrated (11/12/2017)  Stress: No Stress Concern Present (11/12/2017)  Tobacco Use: Medium Risk (10/12/2022)    Readmission Risk Interventions     No data to display

## 2022-11-08 NOTE — Progress Notes (Signed)
[facility]  ROUNDING NOTE   Subjective:   Cynthia Dean is a 72 y.o. 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 She presents to the ED with worsening lower extremity pain. Patient will be admitted for Back pain [M54.9] Bilateral leg pain [M79.604, M79.605]  Update Patient seen resting in bed Daughter at bedside States patient has been irritable since yesterday    Objective:  Vital signs in last 24 hours:  Temp:  [98 F (36.7 C)-98.7 F (37.1 C)] 98.7 F (37.1 C) (04/16 0518) Pulse Rate:  [66-80] 66 (04/16 0518) Resp:  [18-20] 20 (04/16 0518) BP: (98-113)/(44-56) 102/44 (04/16 0518) SpO2:  [91 %-100 %] 91 % (04/16 0518) Weight:  [90.3 kg] 90.3 kg (04/15 1134)  Weight change:  Filed Weights   11/04/22 1320 11/07/22 0753 11/07/22 1134  Weight: 89.9 kg 90.4 kg 90.3 kg    Intake/Output: I/O last 3 completed shifts: In: 564.8 [P.O.:540; IV Piggyback:24.8] Out: 300 [Other:300]   Intake/Output this shift:  No intake/output data recorded.  Physical Exam: General: NAD  Head: Normocephalic, atraumatic.  Moist oral mucosal membranes  Eyes: Anicteric  Lungs:  Clear to auscultation  Heart: Regular rate and rhythm  Abdomen:  Soft, nontender  Extremities: No peripheral edema.  Neurologic: Somnolent   Skin: No lesions  Access: Left AVG    Basic Metabolic Panel: Recent Labs  Lab 11/02/22 1052 11/03/22 0514 11/04/22 0302 11/05/22 0600 11/06/22 0541 11/07/22 0610 11/08/22 0430  NA 141   < > 135 139 140 142 141  K 3.4*   < > 3.4* 3.6 3.2* 4.4 3.8  CL 106   < > 100 99 104 103 101  CO2 21*   < > GLUCOSE 174*   < > 158* 182* 158* 162* 152*  BUN 59*   < > 55* 27* 34* 49* 40*  CREATININE 10.74*   < > 9.68* 6.09* 6.97* 9.04* 7.53*  CALCIUM 9.1   < > 9.4 9.3 8.7* 9.7 9.0  MG  --   --   --   --   --  1.8   --   PHOS 6.0*  --   --   --   --  6.4*  --    < > = values in this interval not displayed.     Liver Function Tests: Recent Labs  Lab 11/02/22 1052  ALBUMIN 2.3*    No results for input(s): "LIPASE", "AMYLASE" in the last 168 hours. No results for input(s): "AMMONIA" in the last 168 hours.  CBC: Recent Labs  Lab 11/04/22 0302 11/05/22 0600 11/06/22 0541 11/07/22 0610 11/08/22 0430  WBC 7.4 8.3 7.2 6.6 7.1  NEUTROABS 3.7 5.0 3.7 3.5 3.5  HGB 9.9* 10.1* 9.7* 10.8* 9.6*  HCT 33.5* 33.8* 33.4* 37.1 32.4*  MCV 80.7 81.3 82.1 82.1 81.6  PLT 202 194 189 180 143*     Cardiac Enzymes: No results for input(s): "CKTOTAL", "CKMB", "CKMBINDEX", "TROPONINI" in the last 168 hours.  BNP: Invalid input(s): "POCBNP"  CBG: No results for input(s): "GLUCAP" in the last 168 hours.   Microbiology: Results for orders placed or performed during the hospital encounter of 10/12/22  Blood culture (routine x 2)     Status: None   Collection Time: 10/12/22  5:40 PM   Specimen: BLOOD  Result Value Ref Range Status   Specimen Description BLOOD BLOOD  RIGHT HAND [facility]  Final   Special Requests   Final    BOTTLES DRAWN AEROBIC AND ANAEROBIC Blood Culture results may not be optimal due to an inadequate volume of blood received in culture bottles   Culture   Final    NO GROWTH 5 DAYS Performed at [facility], [address] [place], [place] [address]    Report Status 10/17/2022 FINAL  Final  Blood culture (routine x 2)     Status: None   Collection Time: 10/12/22  6:56 PM   Specimen: BLOOD  Result Value Ref Range Status   Specimen Description BLOOD BLOOD RIGHT FOREARM  Final   Special Requests   Final    BOTTLES DRAWN AEROBIC ONLY Blood Culture results may not be optimal due to an inadequate volume of blood received in culture bottles   Culture   Final    NO GROWTH 5 DAYS Performed at [facility], [address] [place], [place] [address]    Report Status  10/17/2022 FINAL  Final  Aerobic/Anaerobic Culture w Gram Stain (surgical/deep wound)     Status: None   Collection Time: 10/13/22 11:44 AM   Specimen: Abscess  Result Value Ref Range Status   Specimen Description   Final    ABSCESS Performed at [facility], [address], [place], [place] [address]    Special Requests   Final    MUSCLE ABSCESS Performed at [facility], [address] [place], [place] [address]    Gram Stain   Final    ABUNDANT WBC PRESENT, PREDOMINANTLY PMN NO ORGANISMS SEEN    Culture   Final    FEW STAPHYLOCOCCUS CAPITIS NO ANAEROBES ISOLATED Performed at [facility], [address] [place], [place] [address]    Report Status 10/18/2022 FINAL  Final   Organism ID, Bacteria STAPHYLOCOCCUS CAPITIS  Final      Susceptibility   Staphylococcus capitis - MIC*    CIPROFLOXACIN <=0.5 SENSITIVE Sensitive     ERYTHROMYCIN >=8 RESISTANT Resistant     GENTAMICIN <=0.5 SENSITIVE Sensitive     OXACILLIN <=0.25 SENSITIVE Sensitive     TETRACYCLINE <=1 SENSITIVE Sensitive     VANCOMYCIN 1 SENSITIVE Sensitive     TRIMETH/SULFA <=10 SENSITIVE Sensitive     CLINDAMYCIN INTERMEDIATE Intermediate     RIFAMPIN <=0.5 SENSITIVE Sensitive     Inducible Clindamycin NEGATIVE Sensitive     * FEW STAPHYLOCOCCUS CAPITIS    Coagulation Studies: No results for input(s): "LABPROT", "INR" in the last 72 hours.  Urinalysis: No results for input(s): "COLORURINE", "LABSPEC", "PHURINE", "GLUCOSEU", "HGBUR", "BILIRUBINUR", "KETONESUR", "PROTEINUR", "UROBILINOGEN", "NITRITE", "LEUKOCYTESUR" in the last 72 hours.  Invalid input(s): "APPERANCEUR"    Imaging: No results found.   Medications:    anticoagulant sodium citrate      ceFAZolin (ANCEF) IV      ceFAZolin (ANCEF) IV Stopped (11/02/22 1430)    ceFAZolin (ANCEF) IV Stopped (11/04/22 1244)   methocarbamol (ROBAXIN) IV 110 mL/hr at 11/08/22 0447    (feeding supplement) PROSource Plus  30 mL  Oral BID BM   Chlorhexidine Gluconate Cloth  6 each Topical Q0600   cinacalcet  30 mg Oral Q supper   feeding supplement (NEPRO CARB STEADY)  237 mL Oral TID BM   fentaNYL  1 patch Transdermal Q72H   gabapentin  100 mg Oral Daily   Gerhardt's butt cream   Topical QID   levETIRAcetam  1,000 mg Oral Daily   levETIRAcetam  250 mg Oral Q M,W,F   metoprolol succinate  100 mg Oral Daily   nystatin  5 mL Oral QID   pantoprazole  20 mg Oral BID   psyllium  1 packet Oral Daily   QUEtiapine  50 mg Oral QHS   sertraline  25 mg Oral Daily   sodium chloride flush  10-40 mL Intracatheter Q12H   umeclidinium-vilanterol  1 puff Inhalation Daily   alteplase, anticoagulant sodium citrate, fentaNYL (SUBLIMAZE) injection, hydrALAZINE, lidocaine (PF), lidocaine-prilocaine, LORazepam, methocarbamol (ROBAXIN) IV, ondansetron, senna-docusate, sodium chloride flush  Assessment/ Plan:  [name] is a 72 y.o.  female with end stage renal disease on hemodialysis, coronary artery disease, COPD with intermittent home O2, hypertension, hyperlipidemia, stroke, diabetes mellitus type II, and breast cancer who was admitted to [facility] on 10/12/2022 for Back pain [M54.9] Bilateral leg pain [M79.604, M79.605]  Patient was found to have vertebral osteomyelitis and psoas abscess. Cultures positive for staph capitis  [facility] MWF Left AVG 102.5kg  End-stage renal disease on hemodialysis. Resumed on MWF schedule.    Dialysis received yesterday, UF achieved. Next treatment scheduled for Wednesday.   Discussed with daughter the effects of pain medication with renal patients how mental status changes may occur. Also discussed the pain management is needed for patient to tolerate sitting in chair for dialysis. Patient has been unable to do this with current treatment. If pain medication is increased, the risk of negative side effects increases also, altered mental status, somnolence, etc.   2.  Anemia of chronic kidney disease Normocytic Lab Results  Component Value Date   HGB 9.6 (L) 11/08/2022  Patient receives Mircera at outpatient clinic.  Hemoglobin within target  Will continue low-dose EPO.  3. Secondary Hyperparathyroidism: with outpatient labs: PTH 180 (3/11), phosphorus 5.4, calcium 9.3   Lab Results  Component Value Date   PTH 131 (H) 03/14/2018   CALCIUM 9.0 11/08/2022   CAION 1.09 (L) 08/28/2022   PHOS 6.4 (H) 11/07/2022   -Continue Cinacalcet. -Calcium acceptable, phosphorus elevated.   4.  Hypotension on hemodialysis treatment. Holding home regimen of losartan, metoprolol and torsemide. Blood pressure 102/44  5. Diabetes mellitus type II with chronic kidney disease/renal manifestations: insulin dependent. Glucose well controlled.  Primary team will manage sliding scale insulin.  6. Vertebral osteomyelitis, seen on CT lumber spine. Neurosurgery feels patient is not a surgical candidate CT guided aspiration of psoas abscess on 10/13/22. Cultures positive for Staph Capitis.   -Infectious disease recommending cefazolin with dialysis treatments for 8-week course.     LOS: 27   4/16/202411:33 AM

## 2022-11-08 NOTE — Progress Notes (Signed)
PT Cancellation Note  Patient Details Name: Cynthia Dean MRN: [id number] DOB: 1951-01-16   Cancelled Treatment:    Reason Eval/Treat Not Completed: Other (comment)  Attempted again with co-tx but she remains resistant to therapy session.  Does not want blankets removed.  Minimal engagement today but she is appropriate when she does answer questions.   Danielle Dess 11/08/2022, 12:38 PM

## 2022-11-08 NOTE — Progress Notes (Signed)
Progress Note   [name] JYN:[id number] DOB: 1950-09-18 DOA: 10/12/2022     27 DOS: the patient was seen and examined on 11/08/2022     Subjective:  Patient seen and examined at bedside this morning  Patient still not tolerating sitting up in a recliner for dialysis Goals of care have been discussed with patient's daughter as well as patient We appreciate the input of palliative care Denied nausea vomiting chest pain or cough   Brief hospital course:   Cynthia Dean is a 72 y/o F w/ PMH of ESRD on HD, COPD, DM2, HTN, depression, CVA who presented w/ back pain x 3 weeks.  Pt presented w/ back pain and was found to intradiscal abscess/lumbar discitis/b/l psoas abscess on MRI status post IR guided drainage. Patient has had waxing and waning mental status with intermittently refusing dialysis. Patient's family have decided to continue hemodialysis after speaking with their PCP.     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  Wound cx growing staph capitis.  ID consulted.  Abx de-escalated to cefazolin.  Not a good surgical candidate as per neuro surg.   Pt having severe low back and bilateral leg pain but improving --cont cefazolin with hemodialysis, for 6 weeks, End Date: 12/02/22 --pain management -- trial Norco, given ESRD will use IV fentanyl PRN and before dialysis  Fentanyl patch have been added to improve patient's back pain  Nephrology team planning to have patient sit in the recliner for dialysis this week if able to tolerate   Confusion and agitation Hospital delirium --Pt was agitated, and also not responding to questions or commands during early hospitalization.  Mental status did start to improve, and now pt is calm and interactive, however, at this  time she has been having waxing and waning mental status --cont seroquel 50 mg nightly to help with delirium and sleep.     ESRD: on HD MWF.  Pt refused HD on 10/17/22, 10/18/22 and 3/27, 4/1.   Since then, has been agreeable to dialysis without further issues, getting dialysis in hospital bed. When attempts made to do HD in chair, she did not tolerate on account of severe back pain Fentanyl patch have been added to improve patient's back pain This is helping with back pain   Generalized weakness:  PT recs SNF. TOC following.   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 H&H are stable    Hypokalemia:-Improved --monitor and replete PRN    Peripheral neuropathy --  4/4 - Cautious trial of gabapentin 100 mg daily (ESRD - monitor closely for side effects) 4% - Pt notes significant improvement and no side effects so far --Continue low dose gabapentin and monitor   Hx of seizures:  continue on home dose of Keppra   HLD:  continue on statin    Hx of COPD: w/o exacerbation.  Continue on bronchodilators    DM2: well controlled HbA1c 5.2.  --d/c'ed BG checks    HTN:  --Toprol XL resumed --Continue holding losartan    GERD:  continue on PPI    Depression: severity unknown.  Continue on home dose of sertraline     Overweight: BMI 28.2.  Wt loss efforts recommended   Right foot pain - see peripheral neuropathy     DVT prophylaxis: Heparin SQ  Code Status: DNR this has been confirmed by me from patient's daughter [name]   Family Communication: Updated daughter/POA, [name], over the phone today   Data Reviewed: I have reviewed patient's BMP and CBC showing sodium 141 potassium 3.8 glucose 152   Disposition: Status is: Inpatient Patient still meets criteria for inpatient need given IV antibiotics As well as unsafe discharge without dialysis   Time spent:  Consultants - Nephrology, Infectious Disease, Palliative Care   Level of care: Telemetry Medical       Physical Exam:   General exam: awake alert, no acute distress  Respiratory system: on room air, normal respiratory effort. Cardiovascular system: RRR, no pedal edema Gastrointestinal system: soft, NT, ND  Central nervous system: A&O x3. no gross focal neurologic deficits Extremities:  LUE fistula noted, no edema, normal tone Skin: dry, intact, normal temperature Psychiatry: Good mood, congruent affect, judgment and insight intact         Vitals:   11/07/22 1709 11/07/22 2153 11/08/22 0518 11/08/22 1616  BP: (!) 113/56 (!) 98/55 (!) 102/44 (!) 145/68  Pulse: 75 80 66 66  Resp: Temp: 98 F (36.7 C) 98.7 F (37.1 C) 98.7 F (37.1 C) 98.5 F (36.9 C)  TempSrc:      SpO2: 100% 99% 91% 94%  Weight:      Height:         Author: Loyce Dys, MD 11/08/2022 4:39 PM  For on call review [email or web address].

## 2022-11-09 DIAGNOSIS — T82868A Thrombosis of vascular prosthetic devices, implants and grafts, initial encounter: Secondary | ICD-10-CM

## 2022-11-09 DIAGNOSIS — R410 Disorientation, unspecified: Secondary | ICD-10-CM | POA: Diagnosis not present

## 2022-11-09 DIAGNOSIS — Z992 Dependence on renal dialysis: Secondary | ICD-10-CM | POA: Diagnosis not present

## 2022-11-09 DIAGNOSIS — K6812 Psoas muscle abscess: Secondary | ICD-10-CM | POA: Diagnosis not present

## 2022-11-09 DIAGNOSIS — R52 Pain, unspecified: Secondary | ICD-10-CM

## 2022-11-09 DIAGNOSIS — Z9889 Other specified postprocedural states: Secondary | ICD-10-CM

## 2022-11-09 DIAGNOSIS — Z7189 Other specified counseling: Secondary | ICD-10-CM | POA: Diagnosis not present

## 2022-11-09 DIAGNOSIS — Z87891 Personal history of nicotine dependence: Secondary | ICD-10-CM

## 2022-11-09 DIAGNOSIS — M4646 Discitis, unspecified, lumbar region: Secondary | ICD-10-CM | POA: Diagnosis not present

## 2022-11-09 DIAGNOSIS — N186 End stage renal disease: Secondary | ICD-10-CM | POA: Diagnosis not present

## 2022-11-09 DIAGNOSIS — M79604 Pain in right leg: Secondary | ICD-10-CM | POA: Diagnosis not present

## 2022-11-09 LAB — HEPATITIS B SURFACE ANTIGEN: Hepatitis B Surface Ag: NONREACTIVE

## 2022-11-09 LAB — CBC WITH DIFFERENTIAL/PLATELET
Abs Immature Granulocytes: 0.04 10*3/uL (ref 0.00–0.07)
Basophils Absolute: 0.1 10*3/uL (ref 0.0–0.1)
Basophils Relative: 1 %
Eosinophils Absolute: 0.2 10*3/uL (ref 0.0–0.5)
Eosinophils Relative: 3 %
HCT: 34.5 % — ABNORMAL LOW (ref 36.0–46.0)
Hemoglobin: 10.2 g/dL — ABNORMAL LOW (ref 12.0–15.0)
Immature Granulocytes: 1 %
Lymphocytes Relative: 29 %
Lymphs Abs: 2.2 10*3/uL (ref 0.7–4.0)
MCH: 24.1 pg — ABNORMAL LOW (ref 26.0–34.0)
MCHC: 29.6 g/dL — ABNORMAL LOW (ref 30.0–36.0)
MCV: 81.6 fL (ref 80.0–100.0)
Monocytes Absolute: 0.8 10*3/uL (ref 0.1–1.0)
Monocytes Relative: 11 %
Neutro Abs: 4.3 10*3/uL (ref 1.7–7.7)
Neutrophils Relative %: 55 %
Platelets: 148 10*3/uL — ABNORMAL LOW (ref 150–400)
RBC: 4.23 MIL/uL (ref 3.87–5.11)
RDW: 21 % — ABNORMAL HIGH (ref 11.5–15.5)
WBC: 7.6 10*3/uL (ref 4.0–10.5)
nRBC: 0 % (ref 0.0–0.2)

## 2022-11-09 LAB — BASIC METABOLIC PANEL
Anion gap: 13 (ref 5–15)
BUN: 49 mg/dL — ABNORMAL HIGH (ref 8–23)
CO2: 25 mmol/L (ref 22–32)
Calcium: 9.8 mg/dL (ref 8.9–10.3)
Chloride: 101 mmol/L (ref 98–111)
Creatinine, Ser: 9.52 mg/dL — ABNORMAL HIGH (ref 0.44–1.00)
GFR, Estimated: 4 mL/min — ABNORMAL LOW (ref >60)
Glucose, Bld: 184 mg/dL — ABNORMAL HIGH (ref 70–99)
Potassium: 4.1 mmol/L (ref 3.5–5.1)
Sodium: 139 mmol/L (ref 135–145)

## 2022-11-09 LAB — PHOSPHORUS: Phosphorus: 6.3 mg/dL — ABNORMAL HIGH (ref 2.5–4.6)

## 2022-11-09 MED ORDER — HYDROCODONE-ACETAMINOPHEN 10-325 MG PO TABS: 2.0000 | ORAL_TABLET | Freq: Three times a day (TID) | ORAL | Status: DC

## 2022-11-09 MED ORDER — GABAPENTIN 100 MG PO CAPS
100.0000 mg | ORAL_CAPSULE | ORAL | Status: DC
  Administered 2022-11-09 – 2023-01-09 (×27): 100 mg via ORAL
  Filled 2022-11-09 (×30): qty 1

## 2022-11-09 MED ORDER — FENTANYL CITRATE PF 50 MCG/ML IJ SOSY
12.5000 ug | PREFILLED_SYRINGE | Freq: Three times a day (TID) | INTRAMUSCULAR | Status: DC | PRN
  Administered 2022-11-10 – 2022-11-25 (×18): 12.5 ug via INTRAVENOUS
  Filled 2022-11-09 (×18): qty 1

## 2022-11-09 MED ORDER — HYDROCODONE-ACETAMINOPHEN 10-325 MG PO TABS
1.0000 | ORAL_TABLET | Freq: Three times a day (TID) | ORAL | Status: DC
  Administered 2022-11-09 (×2): 1 via ORAL
  Filled 2022-11-09 (×3): qty 1

## 2022-11-09 NOTE — Progress Notes (Signed)
PT Cancellation Note  Patient Details Name: Cynthia Dean MRN: [id number] DOB: 09/25/1950   Cancelled Treatment:     Pt remains off floor in HD. Will return for treatment whebn pt is available to participate.    Rushie Chestnut 11/09/2022, 11:21 AM

## 2022-11-09 NOTE — H&P (View-Only) (Signed)
Hospital Consult    Reason for Consult:  Non Operating Left A/V Fistula.  Requesting Physician:  Malachi Carl NP  MRN #:  [id number]  History of Present Illness: This is a 72 y.o. female w/ PMH of ESRD on HD, COPD, DM2, HTN, depression, CVA who presented w/ back pain x 3 weeks.  Patient was last seen by vascular surgery on 09/22/2022 for an AV fistulogram.  She continues to have recurrent issues with AV fistula while on hemodialysis.  Vascular surgery was consulted to assess.  Patient is resting comfortably in bed in the hemodialysis unit.  She denies any pain.  Denies any chest pain or shortness of breath.  Patient's mental status is questionable.  She has periods of dementia.  This morning the patient appears somber post hemodialysis.  Not answering many questions.  Not following my commands.  Patient will need an AV fistulogram for further evaluation of hemodialysis access.  I will call the patient's power of attorney daughter Selena Batten and speak with her about the procedure.  Past Medical History:  Diagnosis Date   Acute on chronic respiratory failure with hypoxia (HCC) 07/30/2019   Acute respiratory failure with hypoxia (HCC) 12/31/2018   Carotid arterial disease (HCC)    a. 02/2018 < bilat ICA stenosis.   COPD (chronic obstructive pulmonary disease) (HCC)    Coronary artery disease    a. 11/2015 MV: EF 57%, no ischemia/infarct.   Cryptogenic stroke (HCC)    a. 10/2017 s/p implantable loop recorder (no Afib to date).   Diabetes mellitus without complication (HCC)    Diarrhea    ESRD on dialysis [facility])    GI bleed    a. 01/2019 EGD/Colonoscopy: Mild gastritis. Colitis.   Heart murmur    a. 12/2018 Echo: EF 55-60%, impaired relaxation. Mod dil LA. Mildly dil RA. Mild Ca2+ of AoV.   Hematemesis 06/27/2017   History of 2019 novel coronavirus disease (COVID-19) 07/30/2019   Hyperkalemia    Hyperlipidemia    Hypertension    Intractable nausea and vomiting 08/08/2015   Nausea & vomiting 05/30/2018    Nausea and vomiting 05/29/2018   Pneumonia due to COVID-19 virus     Past Surgical History:  Procedure Laterality Date   A/V FISTULAGRAM Left 12/20/2016   Procedure: A/V Fistulagram;  Surgeon: Renford Dills, MD;  Location: [facility];  Service: Cardiovascular;  Laterality: Left;   A/V FISTULAGRAM Left 09/22/2022   Procedure: A/V Fistulagram;  Surgeon: Annice Needy, MD;  Location: [facility];  Service: Cardiovascular;  Laterality: Left;   A/V SHUNT INTERVENTION N/A 12/20/2016   Procedure: A/V Shunt Intervention;  Surgeon: Renford Dills, MD;  Location: [facility];  Service: Cardiovascular;  Laterality: N/A;   A/V SHUNTOGRAM Left 09/11/2017   Procedure: A/V SHUNTOGRAM;  Surgeon: Annice Needy, MD;  Location: [facility];  Service: Cardiovascular;  Laterality: Left;   A/V SHUNTOGRAM Left 11/21/2019   Procedure: A/V SHUNTOGRAM;  Surgeon: Annice Needy, MD;  Location: [facility];  Service: Cardiovascular;  Laterality: Left;   BREAST BIOPSY Bilateral 07/19/2000   neg   BREAST BIOPSY Left 11/03/2021   [place] bx mass at 3:00, venus marker, axilla bx-hyrdo marker, path pending   COLONOSCOPY WITH PROPOFOL N/A 02/16/2019   Procedure: COLONOSCOPY WITH PROPOFOL;  Surgeon: Toledo, Boykin Nearing, MD;  Location: [facility];  Service: Gastroenterology;  Laterality: N/A;   COLONOSCOPY WITH PROPOFOL N/A 08/31/2022   Procedure: COLONOSCOPY WITH PROPOFOL;  Surgeon: Jaynie Collins, DO;  Location: [facility] ENDOSCOPY;  Service: Gastroenterology;  Laterality: N/A;   ESOPHAGOGASTRODUODENOSCOPY (EGD) WITH PROPOFOL N/A 02/16/2019   Procedure: ESOPHAGOGASTRODUODENOSCOPY (EGD) WITH PROPOFOL;  Surgeon: Toledo, Boykin Nearing, MD;  Location: [facility]OS[facility];  Service: Gastroenterology;  Laterality: N/A;   ESOPHAGOGASTRODUODENOSCOPY (EGD) WITH PROPOFOL N/A 08/28/2022   Procedure: ESOPHAGOGASTRODUODENOSCOPY (EGD) WITH PROPOFOL;  Surgeon: Jaynie Collins, DO;  Location:  [facility] ENDOSCOPY;  Service: Gastroenterology;  Laterality: N/A;   IR US GUIDE BX ASP/DRAIN  03/25/2022   LOOP RECORDER INSERTION N/A 11/16/2017   Procedure: LOOP RECORDER INSERTION;  Surgeon: Duke Salvia, MD;  Location: [facility];  Service: Cardiovascular;  Laterality: N/A;   PERIPHERAL VASCULAR CATHETERIZATION Left 02/02/2015   Procedure: A/V Shuntogram/Fistulagram;  Surgeon: Annice Needy, MD;  Location: [facility];  Service: Cardiovascular;  Laterality: Left;   PERIPHERAL VASCULAR CATHETERIZATION Left 02/02/2015   Procedure: A/V Shunt Intervention;  Surgeon: Annice Needy, MD;  Location: [facility];  Service: Cardiovascular;  Laterality: Left;   PERIPHERAL VASCULAR CATHETERIZATION Left 03/09/2015   Procedure: A/V Shuntogram/Fistulagram;  Surgeon: Annice Needy, MD;  Location: [facility];  Service: Cardiovascular;  Laterality: Left;   PERIPHERAL VASCULAR CATHETERIZATION N/A 03/09/2015   Procedure: A/V Shunt Intervention;  Surgeon: Annice Needy, MD;  Location: [facility];  Service: Cardiovascular;  Laterality: N/A;   PERIPHERAL VASCULAR CATHETERIZATION Left 04/17/2015   Procedure: Upper Extremity Angiography;  Surgeon: Annice Needy, MD;  Location: [facility];  Service: Cardiovascular;  Laterality: Left;   PERIPHERAL VASCULAR CATHETERIZATION  04/17/2015   Procedure: Upper Extremity Intervention;  Surgeon: Annice Needy, MD;  Location: [facility];  Service: Cardiovascular;;   PERIPHERAL VASCULAR CATHETERIZATION N/A 08/10/2015   Procedure: A/V Shuntogram/Fistulagram;  Surgeon: Annice Needy, MD;  Location: [facility];  Service: Cardiovascular;  Laterality: N/A;   PERIPHERAL VASCULAR CATHETERIZATION N/A 08/10/2015   Procedure: A/V Shunt Intervention;  Surgeon: Annice Needy, MD;  Location: [facility];  Service: Cardiovascular;  Laterality: N/A;   PERIPHERAL VASCULAR CATHETERIZATION Left 04/11/2016   Procedure: A/V  Shuntogram/Fistulagram;  Surgeon: Annice Needy, MD;  Location: [facility];  Service: Cardiovascular;  Laterality: Left;   TEE WITHOUT CARDIOVERSION N/A 11/16/2017   Procedure: TRANSESOPHAGEAL ECHOCARDIOGRAM (TEE);  Surgeon: Antonieta Iba, MD;  Location: [facility];  Service: Cardiovascular;  Laterality: N/A;    Allergies  Allergen Reactions   Oxycodone Nausea And Vomiting   Oxycodone-Acetaminophen Other (See Comments)   Wound Dressing Adhesive    Hydrocodone     Intolerant more than allergic   Tape Itching    Skin Dermatitis/itching (tape adhesive) Skin Dermatitis/itching (tape adhesive)   Tapentadol Itching    Skin Dermatitis/itching (tape adhesive) Skin Dermatitis/itching (tape adhesive)     Prior to Admission medications   Medication Sig Start Date End Date Taking? Authorizing Provider  acetaminophen (TYLENOL) 500 MG tablet Take 1 tablet (500 mg total) by mouth every 6 (six) hours as needed. 07/01/22  Yes Merwyn Katos, MD  aspirin EC 81 MG tablet Take 1 tablet (81 mg total) by mouth daily. Swallow whole. 07/01/22  Yes Merwyn Katos, MD  atorvastatin (LIPITOR) 80 MG tablet Take 1 tablet (80 mg total) by mouth every evening. 07/01/22 10/12/22 Yes Bradler, Clent Jacks, MD  BREO ELLIPTA 100-25 MCG/ACT AEPB Inhale 1 puff into the lungs daily.   Yes [provider]  cinacalcet (  SENSIPAR) 30 MG tablet Take 1 tablet (30 mg total) by mouth daily with breakfast. 07/01/22  Yes Bradler, Clent Jacks, MD  cyclobenzaprine (FLEXERIL) 10 MG tablet Take 1 tablet (10 mg total) by mouth 3 (three) times daily as needed for muscle spasms. 10/06/22  Yes Merwyn Katos, MD  diclofenac Sodium (VOLTAREN) 1 % GEL Apply 2 g topically 4 (four) times daily. 09/27/22  Yes [provider]  diphenhydrAMINE (BENADRYL) 25 MG tablet Take 25 mg by mouth at bedtime as needed. 09/28/22  Yes [provider]  diphenoxylate-atropine (LOMOTIL) 2.5-0.025 MG tablet SMARTSIG:1 Tablet(s) By Mouth Every 12  Hours PRN 07/01/22  Yes Merwyn Katos, MD  epoetin alfa (EPOGEN) 10000 UNIT/ML injection Inject 1 mL (10,000 Units total) into the vein every Monday, Wednesday, and Friday with hemodialysis. 09/07/22  Yes Sunnie Nielsen, DO  fluticasone (FLONASE) 50 MCG/ACT nasal spray Place 1 spray into both nostrils daily. 07/01/22 10/12/22 Yes Merwyn Katos, MD  gabapentin (NEURONTIN) 100 MG capsule Take 1 capsule (100 mg total) by mouth 2 (two) times daily. Additional capsule as needed once daily for pain, total maximum daily dose 3 capsules (300 mg) 09/03/22  Yes Sunnie Nielsen, DO  glucose 4 GM chewable tablet Chew 1 tablet (4 g total) by mouth as needed for low blood sugar. 07/01/22  Yes Merwyn Katos, MD  insulin NPH-regular Human (NOVOLIN 70/30) (70-30) 100 UNIT/ML injection Inject 28 Units into the skin 2 (two) times daily with a meal. 07/01/22  Yes Bradler, Clent Jacks, MD  levETIRAcetam (KEPPRA) 1000 MG tablet Take 1 tablet (1,000 mg total) by mouth daily. 07/01/22  Yes Merwyn Katos, MD  lidocaine (LIDODERM) 5 % Place 1 patch onto the skin daily. Remove & Discard patch within 12 hours or as directed by MD 07/01/22  Yes Merwyn Katos, MD  losartan (COZAAR) 100 MG tablet Take 1 tablet (100 mg total) by mouth at bedtime. 07/01/22 10/12/22 Yes Merwyn Katos, MD  metoprolol succinate (TOPROL-XL) 100 MG 24 hr tablet Take 1 tablet (100 mg total) by mouth daily. 07/01/22 10/12/22 Yes Merwyn Katos, MD  omeprazole (PRILOSEC) 20 MG capsule Take 1 capsule (20 mg total) by mouth 2 (two) times daily. 07/01/22 10/12/22 Yes Bradler, Clent Jacks, MD  polyethylene glycol (MIRALAX / GLYCOLAX) 17 g packet Take 17 g by mouth daily. 09/06/22  Yes Sunnie Nielsen, DO  senna-docusate (SENOKOT-S) 8.6-50 MG tablet Take 2 tablets by mouth at bedtime as needed for mild constipation or moderate constipation. 09/06/22  Yes Sunnie Nielsen, DO  sertraline (ZOLOFT) 25 MG tablet Take 1 tablet (25 mg total) by mouth daily. 07/01/22 10/12/22  Yes Merwyn Katos, MD  torsemide (DEMADEX) 100 MG tablet Take 1 tablet (100 mg total) by mouth daily. 07/01/22 10/12/22 Yes Bradler, Clent Jacks, MD  traMADol (ULTRAM) 50 MG tablet Take 50 mg by mouth 2 (two) times daily. 09/28/22  Yes [provider]  traZODone (DESYREL) 50 MG tablet Take 1 tablet (50 mg total) by mouth at bedtime. 07/01/22 10/12/22 Yes Merwyn Katos, MD  vitamin D3 (CHOLECALCIFEROL) 25 MCG tablet Take 1 tablet (1,000 Units total) by mouth daily. 07/01/22  Yes Merwyn Katos, MD  levETIRAcetam (KEPPRA) 250 MG tablet Take 1 tablet (250 mg total) by mouth 3 (three) times a week. 07/01/22 08/26/22  Merwyn Katos, MD  umeclidinium-vilanterol (ANORO ELLIPTA) 62.5-25 MCG/ACT AEPB Inhale 1 puff into the lungs daily. Patient not taking: Reported on 10/13/2022 09/06/22   Sunnie Nielsen,  DO    Social History   Socioeconomic History   Marital status: Single    Spouse name: Not on file   Number of children: 5   Years of education: Not on file   Highest education level: Not on file  Occupational History   Occupation: Disability   Tobacco Use   Smoking status: Former    Packs/day: 0    Types: Cigarettes   Smokeless tobacco: Never  Vaping Use   Vaping Use: Never used  Substance and Sexual Activity   Alcohol use: No   Drug use: No   Sexual activity: Not Currently  Other Topics Concern   Not on file  Social History Narrative   Live with Fiance   Social Determinants of Health   Financial Resource Strain: Low Risk  (11/12/2017)   Overall Financial Resource Strain (CARDIA)    Difficulty of Paying Living Expenses: Not hard at all  Food Insecurity: No Food Insecurity (10/12/2022)   Hunger Vital Sign    Worried About Running Out of Food in the Last Year: Never true    Ran Out of Food in the Last Year: Never true  Transportation Needs: No Transportation Needs (10/12/2022)   PRAPARE - Administrator, Civil Service (Medical): No    Lack of Transportation  (Non-Medical): No  Physical Activity: Insufficiently Active (11/12/2017)   Exercise Vital Sign    Days of Exercise per Week: 5 days    Minutes of Exercise per Session: 20 min  Stress: No Stress Concern Present (11/12/2017)   [employer] of Occupational Health - Occupational Stress Questionnaire    Feeling of Stress : Not at all  Social Connections: Moderately Integrated (11/12/2017)   Social Connection and Isolation Panel [NHANES]    Frequency of Communication with Friends and Family: Twice a week    Frequency of Social Gatherings with Friends and Family: Twice a week    Attends Religious Services: More than 4 times per year    Active Member of [employer] or Organizations: Yes    Attends Engineer, structural: More than 4 times per year    Marital Status: Never married  Intimate Partner Violence: Not At Risk (10/12/2022)   Humiliation, Afraid, Rape, and Kick questionnaire    Fear of Current or Ex-Partner: No    Emotionally Abused: No    Physically Abused: No    Sexually Abused: No     Family History  Problem Relation Age of Onset   Breast cancer Father 65   Stroke Mother    Heart attack Mother    Heart Problems Sister    Breast cancer Cousin 72       1 st cousin. Maternal     ROS: Otherwise negative unless mentioned in HPI  Physical Examination  Vitals:   11/09/22 1005 11/09/22 1010  BP:  (!) 105/58  Pulse: 82 80  Resp: 17 17  Temp:  97.8 F (36.6 C)  SpO2: 95% 95%   Body mass index is 26.94 kg/m.  General:  WDWN in NAD Gait: Not observed HENT: WNL, normocephalic Pulmonary: normal non-labored breathing, without Rales, rhonchi,  wheezing Cardiac: regular, without  Murmurs, rubs or gallops; without carotid bruits Abdomen: Positive bowel sounds, soft, NT/ND, no masses Skin: without rashes Vascular Exam/Pulses: Palpable throughout  Extremities: without ischemic changes, without Gangrene , without cellulitis; without open wounds;  Musculoskeletal: no muscle  wasting or atrophy  Neurologic: A&O X 3;  No focal weakness or paresthesias are detected; speech  is fluent/normal. Noted dementia from time to time. Daughter called to get consent Psychiatric:  The pt has Normal affect. Lymph:  Unremarkable  CBC    Component Value Date/Time   WBC 7.6 11/09/2022 0618   RBC 4.23 11/09/2022 0618   HGB 10.2 (L) 11/09/2022 0618   HGB 9.5 (L) 06/17/2014 0538   HCT 34.5 (L) 11/09/2022 0618   HCT 29.6 (L) 06/17/2014 0538   PLT 148 (L) 11/09/2022 0618   PLT 173 06/17/2014 0538   MCV 81.6 11/09/2022 0618   MCV 92 06/17/2014 0538   MCH 24.1 (L) 11/09/2022 0618   MCHC 29.6 (L) 11/09/2022 0618   RDW 21.0 (H) 11/09/2022 0618   RDW 16.2 (H) 06/17/2014 0538   LYMPHSABS 2.2 11/09/2022 0618   LYMPHSABS 2.5 06/17/2014 0538   MONOABS 0.8 11/09/2022 0618   MONOABS 1.0 (H) 06/17/2014 0538   EOSABS 0.2 11/09/2022 0618   EOSABS 0.2 06/17/2014 0538   BASOSABS 0.1 11/09/2022 0618   BASOSABS 0.1 06/17/2014 0538    BMET    Component Value Date/Time   NA 139 11/09/2022 0618   NA 135 (L) 06/16/2014 0915   K 4.1 11/09/2022 0618   K 4.8 06/16/2014 0915   CL 101 11/09/2022 0618   CL 100 06/16/2014 0915   CO2 25 11/09/2022 0618   CO2 22 06/16/2014 0915   GLUCOSE 184 (H) 11/09/2022 0618   GLUCOSE 226 (H) 06/16/2014 0915   BUN 49 (H) 11/09/2022 0618   BUN 47 (H) 06/16/2014 0915   CREATININE 9.52 (H) 11/09/2022 0618   CREATININE 7.60 (H) 06/16/2014 0915   CALCIUM 9.8 11/09/2022 0618   CALCIUM 7.2 (L) 06/16/2014 0915   GFRNONAA 4 (L) 11/09/2022 0618   GFRNONAA 6 (L) 06/16/2014 0915   GFRNONAA 6 (L) 04/07/2014 0918   GFRAA 5 (L) 01/30/2020 0500   GFRAA 7 (L) 06/16/2014 0915   GFRAA 7 (L) 04/07/2014 0918    COAGS: Lab Results  Component Value Date   INR 1.7 (H) 03/24/2022   INR 1.2 03/19/2022   INR 1.1 07/30/2019     Non-Invasive Vascular Imaging:    OPERATIVE NOTE     PROCEDURE: 1.  Left brachial artery to axillary vein arteriovenous graft  cannulation under ultrasound guidance 2.  Left arm shuntogram 3.  Percutaneous transluminal angioplasty of the venous anastomosis and graft with a 5 mm diameter by 15 cm length Lutonix drug-coated angioplasty balloon 4.  Stent placement to the AV graft with 8 mm diameter by 15 cm length Viabahn stent   PRE-OPERATIVE DIAGNOSIS: 1. ESRD 2. Malfunctioning left brachial artery to axillary vein arteriovenous graft   POST-OPERATIVE DIAGNOSIS: same as above    SURGEON: Festus Barren, MD   ANESTHESIA: local with MCS   ESTIMATED BLOOD LOSS: 10 cc   FINDING(S): Mangled stents within the AV graft with near occlusive stenosis and chronic thrombus.  There is also about a 75 to 80% stenosis of the venous anastomosis and multiple veins draining the outflow at the axillary vein  Statin:  Yes.   Beta Blocker:  Yes.   Aspirin:  No. ACEI:  No. ARB:  No. CCB use:  Yes Other antiplatelets/anticoagulants:  No.    ASSESSMENT/PLAN: This is a 72 y.o. female with a nonoperating AV fistula.  On 09/22/2022 she underwent a left arm shuntogram.  Transluminal angioplasty of the venous anastomosis and graft were completed.  A stent was placed to the AV graft at that time.  The findings upon the fistulogram were  mangled stents within the AV graft near the occlusive stenosis with a chronic thrombus.  He may be the cause of her nonoperative AV shunt again.  I discussed in detail today with the patient's power of attorney, daughter Seona Clemenson the procedure, benefits, risks, and complications.  She understands completely and verbalizes her understanding.  She reiterates that her mother just had this procedure done back in February.  She gives her consent today to proceed with the procedure.  Patient was made n.p.o. after midnight.   -I discussed the plan in detail with Dr. Festus Barren MD and he agrees with the plan.   Marcie Bal [facility] 11/09/2022 11:10 AM

## 2022-11-09 NOTE — Progress Notes (Signed)
Daily Progress Note   Patient Name: Cynthia Dean       Date: 11/09/2022 DOB: 07-May-1951  Age: 72 y.o. MRN#: [id number] Attending Physician: Pennie Banter, DO Primary Care Physician: Loistine Chance, MD Admit Date: 10/12/2022  Reason for Consultation/Follow-up: Establishing goals of care  Subjective: Notes and labs reviewed. In to dialysis unit to see patient. She is resting in hospital bed with eyes closed. Per nursing current concern with trying to convert bed into chair feature as she is 2 hours into session, and if she becomes uncomfortable with chair position, it is difficult to re-settle her.   PMT consulted for GOC, and has been assisting/working with primary team on pain management. Discussed premedication with IV Fentanyl, and RN advises there have been previous concerns with sleepiness prior to coming down to dialysis, or concerns medication could affect BP. Thus far, Fentanyl has been used due to it's lack of clearance by HD. As one other option, could try hydrocodone, as there is clearance with HD, but minimal clearance.  If hydrocodone is used, will need to DC fentanyl patch.  PMT will continue to follow for GOC.     Length of Stay: 28  Current Medications: Scheduled Meds:   (feeding supplement) PROSource Plus  30 mL Oral BID BM   Chlorhexidine Gluconate Cloth  6 each Topical Q0600   cinacalcet  30 mg Oral Q supper   feeding supplement (NEPRO CARB STEADY)  237 mL Oral TID BM   fentaNYL  1 patch Transdermal Q72H   gabapentin  100 mg Oral Daily   Gerhardt's butt cream   Topical QID   levETIRAcetam  1,000 mg Oral Daily   levETIRAcetam  250 mg Oral Q M,W,F   metoprolol succinate  100 mg Oral Daily   pantoprazole  20 mg Oral BID   psyllium  1 packet Oral Daily    QUEtiapine  50 mg Oral QHS   sertraline  25 mg Oral Daily   sodium chloride flush  10-40 mL Intracatheter Q12H   umeclidinium-vilanterol  1 puff Inhalation Daily    Continuous Infusions:  anticoagulant sodium citrate      ceFAZolin (ANCEF) IV      ceFAZolin (ANCEF) IV Stopped (11/02/22 1430)    ceFAZolin (ANCEF) IV Stopped (11/04/22 1244)   methocarbamol (ROBAXIN) IV Stopped (  11/08/22 1455)    PRN Meds: alteplase, anticoagulant sodium citrate, fentaNYL (SUBLIMAZE) injection, hydrALAZINE, lidocaine (PF), lidocaine-prilocaine, LORazepam, methocarbamol (ROBAXIN) IV, ondansetron, senna-docusate, sodium chloride flush  Physical Exam Pulmonary:     Effort: Pulmonary effort is normal.  Neurological:     Mental Status: She is alert.             Vital Signs: BP 90/62 (BP Location: Right Arm)   Pulse 85   Temp 97.9 F (36.6 C) (Oral)   Resp (!) 21   Ht 6' (1.829 m)   Wt 90.8 kg   SpO2 98%   BMI 27.15 kg/m  SpO2: SpO2: 98 % O2 Device: O2 Device: Room Air O2 Flow Rate: O2 Flow Rate (L/min): 3 L/min  Intake/output summary:  Intake/Output Summary (Last 24 hours) at 11/09/2022 0959 Last data filed at 11/08/2022 1915 Gross per 24 hour  Intake 474.83 ml  Output --  Net 474.83 ml   LBM: Last BM Date : 11/08/22 Baseline Weight: Weight: 113.4 kg Most recent weight: Weight: 90.8 kg   Patient Active Problem List   Diagnosis Date Noted   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/17/2022   Back pain 10/12/2022   Discitis of lumbar region 10/12/2022   Myositis 10/12/2022   Hemoglobin drop 08/28/2022   Chronic respiratory failure with hypoxia 08/26/2022   Acute on chronic blood loss anemia 08/26/2022   History of lower GI bleeding 08/26/2022   Insulin-requiring or dependent type II diabetes mellitus 08/26/2022   Chronic anticoagulation 08/26/2022   History of pulmonary embolism 03/20/2022 08/26/2022   Breast cancer, left S/P partial mastectomy,03/04/2022 08/26/2022   Generalized weakness    Unable to  ambulate 05/09/2022   ESRD on dialysis    Bilateral leg pain    Depression    Pulmonary emboli    Obesity with body mass index (BMI) of 30.0 to 39.9    Anemia    Chronic diastolic CHF (congestive heart failure)    Seroma of breast    Bacteremia due to coagulase-negative Staphylococcus    Left breast abscess    Urinary tract infection 03/22/2022   Anemia of chronic disease 03/22/2022   Acute pulmonary embolism without acute cor pulmonale 03/20/2022   Dyslipidemia 03/20/2022   Diabetic neuropathy 03/20/2022   Sepsis due to cellulitis 03/20/2022   Delay kidney tx func d/t fluid overload requiring acute dialysis 01/29/2020   Delay kidney tx func d/t ATN and fluid overload require acute dialysis 01/29/2020   Hypoglycemia 10/21/2019   Unresponsiveness 10/21/2019   GERD (gastroesophageal reflux disease) 10/21/2019   Hypothermia    Sepsis with encephalopathy without septic shock    Diarrhea    Acute metabolic encephalopathy    Acute on chronic diastolic CHF (congestive heart failure)    Acute on chronic respiratory failure with hypoxia 07/30/2019   Rectal bleed 02/13/2019   Acute respiratory failure with hypoxia 12/31/2018   History of CVA (cerebrovascular accident) 05/29/2018   Aphasia as late effect of cerebrovascular accident (CVA) 05/01/2018   Seizure disorder 03/14/2018   Stroke 03/07/2018   Slurred speech 11/14/2017   Type 2 diabetes mellitus with ESRD (end-stage renal disease) 11/12/2017   CAD (coronary artery disease) 11/12/2017   COPD (chronic obstructive pulmonary disease) 11/12/2017   ESRD on hemodialysis 11/12/2017   CVA (cerebral vascular accident) 04/14/2016   Essential hypertension 11/27/2015   Hyperlipidemia 11/27/2015   Prolonged Q-T interval on ECG 11/26/2015   Aphasia complicating stroke 11/05/2015   Cervical spine arthritis 07/27/2015   Diabetic retinopathy without macular  edema associated with type 2 diabetes mellitus 07/27/2015   Osteoarthritis of spine  with radiculopathy, lumbar region 07/27/2015   Obesity, Class III, BMI 40-49.9 (morbid obesity) 07/27/2015   Vitamin D deficiency 01/21/2011   Diastolic heart failure 10/27/2010    Palliative Care Assessment & Plan   Recommendations/Plan: Consider changing fentanyl patch to Norco for pain.  Consider leaving rescue dose of IV fentanyl for breakthrough pain.  This was discussed in depth with primary team attending, and medication was changed following conversation with pharmacist Dareen Piano.  Code Status:    Code Status Orders  (From admission, onward)           Start     Ordered   11/01/22 1535  Do not attempt resuscitation (DNR)  Continuous       Question Answer Comment  If patient has no pulse and is not breathing Do Not Attempt Resuscitation   If patient has a pulse and/or is breathing: Medical Treatment Goals COMFORT MEASURES: Keep clean/warm/dry, use medication by any route; positioning, wound care and other measures to relieve pain/suffering; use oxygen, suction/manual treatment of airway obstruction for comfort; do not transfer unless for comfort needs.   Consent: Discussion documented in EHR or advanced directives reviewed      11/01/22 1534           Code Status History     Date Active Date Inactive Code Status Order ID Comments User Context   10/12/2022 1101 11/01/2022 1534 Full Code [id number]  Charise Killian, MD Inpatient   08/26/2022 2018 09/09/2022 2244 Full Code [id number]  Andris Baumann, MD ED   05/09/2022 0919 05/17/2022 0109 Full Code [id number]  Lorretta Harp, MD ED   03/20/2022 0121 04/02/2022 0126 Full Code [id number]  Mansy, Vernetta Honey, MD ED   01/29/2020 2038 01/30/2020 2215 Full Code [id number]  Lucile Shutters, MD Inpatient   01/29/2020 1210 01/29/2020 2038 Full Code [id number]  Lucile Shutters, MD Inpatient   10/21/2019 0902 10/25/2019 2211 Full Code [id number]  Lorretta Harp, MD ED   07/30/2019 2224 08/11/2019 2130 Full Code [id number]  Mansy, Vernetta Honey, MD ED   02/13/2019 1329  02/16/2019 1849 Full Code [id number]  Jimmye Norman, NP ED   12/31/2018 1159 01/02/2019 1330 Full Code [id number]  Jimmye Norman, NP ED   05/29/2018 1541 05/30/2018 2041 Full Code [id number]  Alford Highland, MD ED   03/14/2018 0141 03/15/2018 0336 Full Code [id number]  Cammy Copa, MD Inpatient   03/08/2018 0005 03/09/2018 2108 Full Code [id number]  Altamese Dilling, MD Inpatient   11/15/2017 0017 11/17/2017 1809 Full Code [id number]  Oralia Manis, MD ED   11/12/2017 2219 11/13/2017 1935 Full Code [id number]  Oralia Manis, MD ED   04/14/2016 1330 04/14/2016 1700 Full Code [id number]  Adrian Saran, MD ED   08/08/2015 2257 08/09/2015 2054 Full Code [id number]  Wyatt Haste, MD ED   04/17/2015 1334 04/17/2015 1836 Full Code [id number]  Annice Needy, MD Inpatient      Advance Directive Documentation    Flowsheet Row Most Recent Value  Type of Advance Directive Healthcare Power of Attorney  Pre-existing out of facility DNR order (yellow form or pink MOST form) --  "MOST" Form in Place? --       Care plan was discussed with primary MD  Thank you for allowing the Palliative Medicine Team to assist in the care of this patient.    Morton Stall, NP  Please contact Palliative Medicine Team phone at [phone] for questions  and concerns.

## 2022-11-09 NOTE — Progress Notes (Signed)
Date of Admission:  10/12/2022      ID: Cynthia Dean is a 72 y.o. female  Principal Problem:   Back pain Active Problems:   Essential hypertension   Seizure disorder   Diabetic retinopathy without macular edema associated with type 2 diabetes mellitus   GERD (gastroesophageal reflux disease)   Dyslipidemia   Diabetic neuropathy   Anemia of chronic disease   ESRD on dialysis   Bilateral leg pain   Anemia   Insulin-requiring or dependent type II diabetes mellitus   Discitis of lumbar region   Myositis   Psoas abscess Medical history 03/04/22 Ca breast lumpectomy, TAD 8/26-9/8 Hospitalization for fatigue weakness- diagnosed with pulmonary embolism, Staph capitis bacteremia and left breast fluid collection which initially  was thought to be an abscess but on aspiration was old blood and likely a seroma with negative fluid culture She was treated with 2 weeks of appropriate Iv antibiotic for the staph capitis until 04/03/22.  She had been to the ED and admitted many times after that Oct 2023- Back pain and rt sciatica- MRI lumbar spine done on 05/11/22 Degenerative disc disease.  Pt did not want to pursue chemo or radiation for the breast cancer ( ER-, PR 20%, HER 2 positive)  She was hospitalized in Feb 2024 for severe anemia -Hb 5.4- GI work up negative except for diverticulosis ( not bleeding on the day of the scope)  Back again with back pain and b/l radiation of pain down the leg  MRI this time showed b/l lumbar abscess, L4-L5 discitis, b/l psoas abscess  Aspiration of the rt psoas abscess on 10/13/22 is positive for staph capitis    Some dialysis sessions are being missed due to malfunctioning graft or patient refusing  Subjective: As per patient back pain is still there   Medications:   (feeding supplement) PROSource Plus  30 mL Oral BID BM   Chlorhexidine Gluconate Cloth  6 each Topical Q0600   cinacalcet  30 mg Oral Q supper   feeding supplement  (NEPRO CARB STEADY)  237 mL Oral TID BM   fentaNYL  1 patch Transdermal Q72H   gabapentin  100 mg Oral Daily   Gerhardt's butt cream   Topical QID   levETIRAcetam  1,000 mg Oral Daily   levETIRAcetam  250 mg Oral Q M,W,F   metoprolol succinate  100 mg Oral Daily   pantoprazole  20 mg Oral BID   psyllium  1 packet Oral Daily   QUEtiapine  50 mg Oral QHS   sertraline  25 mg Oral Daily   sodium chloride flush  10-40 mL Intracatheter Q12H   umeclidinium-vilanterol  1 puff Inhalation Daily    Objective: Vital signs in last 24 hours: Patient Vitals for the past 24 hrs:  BP Temp Temp src Pulse Resp SpO2 Weight  11/09/22 1043 -- -- -- -- -- -- 90.1 kg  11/09/22 1010 (!) 105/58 97.8 F (36.6 C) Oral 80 17 95 % --  11/09/22 1005 -- -- -- 82 17 95 % --  11/09/22 1000 (!) 92/56 -- -- 78 18 99 % --  11/09/22 0930 90/62 -- -- 85 (!) 21 98 % --  11/09/22 0900 102/60 -- -- 85 14 95 % --  11/09/22 0830 119/66 -- -- 79 (!) 22 98 % --  11/09/22 0804 134/71 -- -- 77 12 96 % --  11/09/22 0801 -- -- -- -- -- -- 90.8 kg  11/09/22 0745 128/60 97.9 F (36.6  C) Oral 85 18 93 % --  11/09/22 0736 123/61 98.1 F (36.7 C) Oral 78 12 95 % --  11/09/22 0443 100/88 98.3 F (36.8 C) Oral 74 20 95 % --  11/08/22 1951 (!) 106/54 98.5 F (36.9 C) Oral 76 18 94 % --  11/08/22 1616 (!) 145/68 98.5 F (36.9 C) -- 66 16 94 % --       PHYSICAL EXAM:  General: awake, , no distress , in bed Some expressive dysphasia  Lungs: b/l air entry. Heart: s1s2 Abdomen: Soft, Extremities: left AVG site -okay Skin: No rashes or lesions. Or bruising   Lab Results    Latest Ref Rng & Units 11/09/2022    6:18 AM 11/08/2022    4:30 AM 11/07/2022    6:10 AM  CBC  WBC 4.0 - 10.5 K/uL 7.6  7.1  6.6   Hemoglobin 12.0 - 15.0 g/dL 82.9  9.6  56.2   Hematocrit 36.0 - 46.0 % 34.5  32.4  37.1   Platelets 150 - 400 K/uL 148  143  180        Latest Ref Rng & Units 11/09/2022    6:18 AM 11/08/2022    4:30 AM 11/07/2022     6:10 AM  CMP  Glucose 70 - 99 mg/dL 130  865  784   BUN 8 - 23 mg/dL 49  40  49   Creatinine 0.44 - 1.00 mg/dL 6.96  2.95  2.84   Sodium 135 - 145 mmol/L 139  141  142   Potassium 3.5 - 5.1 mmol/L 4.1  3.8  4.4   Chloride 98 - 111 mmol/L 101  101  103   CO2 22 - 32 mmol/L Calcium 8.9 - 10.3 mg/dL 9.8  9.0  9.7     Erythrocyte Sedimentation Rate     Component Value Date/Time   ESRSEDRATE 57 (H) 10/28/2022 0756      Microbiology: 10/12/22 BC- NG so far 3/21 psoas abscess- staph capitis    Assessment/Plan:  Lumbar discitis,L4-L5  epidural phlegmon and b/l psoas abscess on MRI Underwent IR aspiration of psoas abscess on 10/13/22 Vanco and cefepime started on 10/13/22 after procedure Gram stain numerous wbc no organism Culture positive for staph capitis a coag neg staph bacteria Blood culture ng On 03/19/22 she had 4/4 blood culture positive for staph capitis At that time she had left breast swelling , induration initially thought to be an abscess post lumpectomy on 03/04/22 for malignancy. US done showed complicated cystic lesion measuring 14.5 x 6.3 x 9.2 cm. Left breast. IR aspirated 135 ml of old blood from the left breast and culture was negative She was then treated with a total of 2 weeks of appropriate antibiotic for the pan sensitive staph capitis ( Iv ceftriaxone, vanco and then cefazolin) The 2 staph capitis have slightly different susceptibility pattern- the one in Aug from blood was pan sensitive and the one from the abscess has resistance to clinda and erythromycin  lumbar infection could be an indolent process from Aug bacteremia, vs a new infection because she is at risk  due to being on dialysis and having DDD Or did she have endocarditis and seed the spine?. Last echo from 03/22/22 did not show any valve vegetations. It was repeated on 3/27 and shows no vegetation Blood culture this time negative  Will check Esr/CRP tomorrow   On cefazolin Will now need   cefazolin 6 weeks- IV  given during dilaysis-until 12/02/22  OPAT order placed To make sure that she does not miss any dose even if dilaysis is missed- it owuld be safe to switch her to daily regimen if she is expected to be in the hospital for a longer period   Confusion/agitation- resolved    ESRD on dialysis Recent malfunctioning AVG and underwent fistulogram, angioplasty and stent placement on 09/22/22 She refuses dialysis intermittently     Left breast Carcinoma- s/p  lumpectomy and targeted axillary dissection She did not want to take chemo/radiation    Anemia   DM -    HTN on metoprolol  H/o Pulmonary embolism H/o CVA  Discussed the management with patient and her partner at bed side  Discharge antibiotics: Cefazolin given during dialysis 2 grams- 2 grams - 3 grams For 6 weeks End Date: 12/02/22   Labs weekly while on IV antibiotics: _X_ CBC with differential __ BMP _X_ CMP _X_ CRP _X_ ESR     Fax weekly lab results  promptly to [phone]   Clinic Follow Up Appt: 12/01/22 at 10.30Am     Call [phone] with any questions or concerns  ID will sign off- call if needed

## 2022-11-09 NOTE — Consult Note (Signed)
Hospital Consult    Reason for Consult:  Non Operating Left A/V Fistula.  Requesting Physician:  Malachi Carl NP  MRN #:  [id number]  History of Present Illness: This is a 72 y.o. female w/ PMH of ESRD on HD, COPD, DM2, HTN, depression, CVA who presented w/ back pain x 3 weeks.  Patient was last seen by vascular surgery on 09/22/2022 for an AV fistulogram.  She continues to have recurrent issues with AV fistula while on hemodialysis.  Vascular surgery was consulted to assess.  Patient is resting comfortably in bed in the hemodialysis unit.  She denies any pain.  Denies any chest pain or shortness of breath.  Patient's mental status is questionable.  She has periods of dementia.  This morning the patient appears somber post hemodialysis.  Not answering many questions.  Not following my commands.  Patient will need an AV fistulogram for further evaluation of hemodialysis access.  I will call the patient's power of attorney daughter Selena Batten and speak with her about the procedure.  Past Medical History:  Diagnosis Date   Acute on chronic respiratory failure with hypoxia (HCC) 07/30/2019   Acute respiratory failure with hypoxia (HCC) 12/31/2018   Carotid arterial disease (HCC)    a. 02/2018 < bilat ICA stenosis.   COPD (chronic obstructive pulmonary disease) (HCC)    Coronary artery disease    a. 11/2015 MV: EF 57%, no ischemia/infarct.   Cryptogenic stroke (HCC)    a. 10/2017 s/p implantable loop recorder (no Afib to date).   Diabetes mellitus without complication (HCC)    Diarrhea    ESRD on dialysis [facility])    GI bleed    a. 01/2019 EGD/Colonoscopy: Mild gastritis. Colitis.   Heart murmur    a. 12/2018 Echo: EF 55-60%, impaired relaxation. Mod dil LA. Mildly dil RA. Mild Ca2+ of AoV.   Hematemesis 06/27/2017   History of 2019 novel coronavirus disease (COVID-19) 07/30/2019   Hyperkalemia    Hyperlipidemia    Hypertension    Intractable nausea and vomiting 08/08/2015   Nausea & vomiting 05/30/2018    Nausea and vomiting 05/29/2018   Pneumonia due to COVID-19 virus     Past Surgical History:  Procedure Laterality Date   A/V FISTULAGRAM Left 12/20/2016   Procedure: A/V Fistulagram;  Surgeon: Renford Dills, MD;  Location: [facility];  Service: Cardiovascular;  Laterality: Left;   A/V FISTULAGRAM Left 09/22/2022   Procedure: A/V Fistulagram;  Surgeon: Annice Needy, MD;  Location: [facility];  Service: Cardiovascular;  Laterality: Left;   A/V SHUNT INTERVENTION N/A 12/20/2016   Procedure: A/V Shunt Intervention;  Surgeon: Renford Dills, MD;  Location: [facility];  Service: Cardiovascular;  Laterality: N/A;   A/V SHUNTOGRAM Left 09/11/2017   Procedure: A/V SHUNTOGRAM;  Surgeon: Annice Needy, MD;  Location: [facility];  Service: Cardiovascular;  Laterality: Left;   A/V SHUNTOGRAM Left 11/21/2019   Procedure: A/V SHUNTOGRAM;  Surgeon: Annice Needy, MD;  Location: [facility];  Service: Cardiovascular;  Laterality: Left;   BREAST BIOPSY Bilateral 07/19/2000   neg   BREAST BIOPSY Left 11/03/2021   [place] bx mass at 3:00, venus marker, axilla bx-hyrdo marker, path pending   COLONOSCOPY WITH PROPOFOL N/A 02/16/2019   Procedure: COLONOSCOPY WITH PROPOFOL;  Surgeon: Toledo, Boykin Nearing, MD;  Location: [facility]OS[facility];  Service: Gastroenterology;  Laterality: N/A;   COLONOSCOPY WITH PROPOFOL N/A 08/31/2022   Procedure: COLONOSCOPY WITH PROPOFOL;  Surgeon: Jaynie Collins, DO;  Location: [facility] ENDOSCOPY;  Service: Gastroenterology;  Laterality: N/A;   ESOPHAGOGASTRODUODENOSCOPY (EGD) WITH PROPOFOL N/A 02/16/2019   Procedure: ESOPHAGOGASTRODUODENOSCOPY (EGD) WITH PROPOFOL;  Surgeon: Toledo, Boykin Nearing, MD;  Location: [facility]OS[facility];  Service: Gastroenterology;  Laterality: N/A;   ESOPHAGOGASTRODUODENOSCOPY (EGD) WITH PROPOFOL N/A 08/28/2022   Procedure: ESOPHAGOGASTRODUODENOSCOPY (EGD) WITH PROPOFOL;  Surgeon: Jaynie Collins, DO;  Location:  [facility] ENDOSCOPY;  Service: Gastroenterology;  Laterality: N/A;   IR US GUIDE BX ASP/DRAIN  03/25/2022   LOOP RECORDER INSERTION N/A 11/16/2017   Procedure: LOOP RECORDER INSERTION;  Surgeon: Duke Salvia, MD;  Location: [facility];  Service: Cardiovascular;  Laterality: N/A;   PERIPHERAL VASCULAR CATHETERIZATION Left 02/02/2015   Procedure: A/V Shuntogram/Fistulagram;  Surgeon: Annice Needy, MD;  Location: [facility];  Service: Cardiovascular;  Laterality: Left;   PERIPHERAL VASCULAR CATHETERIZATION Left 02/02/2015   Procedure: A/V Shunt Intervention;  Surgeon: Annice Needy, MD;  Location: [facility];  Service: Cardiovascular;  Laterality: Left;   PERIPHERAL VASCULAR CATHETERIZATION Left 03/09/2015   Procedure: A/V Shuntogram/Fistulagram;  Surgeon: Annice Needy, MD;  Location: [facility];  Service: Cardiovascular;  Laterality: Left;   PERIPHERAL VASCULAR CATHETERIZATION N/A 03/09/2015   Procedure: A/V Shunt Intervention;  Surgeon: Annice Needy, MD;  Location: [facility];  Service: Cardiovascular;  Laterality: N/A;   PERIPHERAL VASCULAR CATHETERIZATION Left 04/17/2015   Procedure: Upper Extremity Angiography;  Surgeon: Annice Needy, MD;  Location: [facility];  Service: Cardiovascular;  Laterality: Left;   PERIPHERAL VASCULAR CATHETERIZATION  04/17/2015   Procedure: Upper Extremity Intervention;  Surgeon: Annice Needy, MD;  Location: [facility];  Service: Cardiovascular;;   PERIPHERAL VASCULAR CATHETERIZATION N/A 08/10/2015   Procedure: A/V Shuntogram/Fistulagram;  Surgeon: Annice Needy, MD;  Location: [facility];  Service: Cardiovascular;  Laterality: N/A;   PERIPHERAL VASCULAR CATHETERIZATION N/A 08/10/2015   Procedure: A/V Shunt Intervention;  Surgeon: Annice Needy, MD;  Location: [facility];  Service: Cardiovascular;  Laterality: N/A;   PERIPHERAL VASCULAR CATHETERIZATION Left 04/11/2016   Procedure: A/V  Shuntogram/Fistulagram;  Surgeon: Annice Needy, MD;  Location: [facility];  Service: Cardiovascular;  Laterality: Left;   TEE WITHOUT CARDIOVERSION N/A 11/16/2017   Procedure: TRANSESOPHAGEAL ECHOCARDIOGRAM (TEE);  Surgeon: Antonieta Iba, MD;  Location: [facility];  Service: Cardiovascular;  Laterality: N/A;    Allergies  Allergen Reactions   Oxycodone Nausea And Vomiting   Oxycodone-Acetaminophen Other (See Comments)   Wound Dressing Adhesive    Hydrocodone     Intolerant more than allergic   Tape Itching    Skin Dermatitis/itching (tape adhesive) Skin Dermatitis/itching (tape adhesive)   Tapentadol Itching    Skin Dermatitis/itching (tape adhesive) Skin Dermatitis/itching (tape adhesive)     Prior to Admission medications   Medication Sig Start Date End Date Taking? Authorizing Provider  acetaminophen (TYLENOL) 500 MG tablet Take 1 tablet (500 mg total) by mouth every 6 (six) hours as needed. 07/01/22  Yes Merwyn Katos, MD  aspirin EC 81 MG tablet Take 1 tablet (81 mg total) by mouth daily. Swallow whole. 07/01/22  Yes Merwyn Katos, MD  atorvastatin (LIPITOR) 80 MG tablet Take 1 tablet (80 mg total) by mouth every evening. 07/01/22 10/12/22 Yes Bradler, Clent Jacks, MD  BREO ELLIPTA 100-25 MCG/ACT AEPB Inhale 1 puff into the lungs daily.   Yes [provider]  cinacalcet (  SENSIPAR) 30 MG tablet Take 1 tablet (30 mg total) by mouth daily with breakfast. 07/01/22  Yes Bradler, Clent Jacks, MD  cyclobenzaprine (FLEXERIL) 10 MG tablet Take 1 tablet (10 mg total) by mouth 3 (three) times daily as needed for muscle spasms. 10/06/22  Yes Merwyn Katos, MD  diclofenac Sodium (VOLTAREN) 1 % GEL Apply 2 g topically 4 (four) times daily. 09/27/22  Yes [provider]  diphenhydrAMINE (BENADRYL) 25 MG tablet Take 25 mg by mouth at bedtime as needed. 09/28/22  Yes [provider]  diphenoxylate-atropine (LOMOTIL) 2.5-0.025 MG tablet SMARTSIG:1 Tablet(s) By Mouth Every 12  Hours PRN 07/01/22  Yes Merwyn Katos, MD  epoetin alfa (EPOGEN) 10000 UNIT/ML injection Inject 1 mL (10,000 Units total) into the vein every Monday, Wednesday, and Friday with hemodialysis. 09/07/22  Yes Sunnie Nielsen, DO  fluticasone (FLONASE) 50 MCG/ACT nasal spray Place 1 spray into both nostrils daily. 07/01/22 10/12/22 Yes Merwyn Katos, MD  gabapentin (NEURONTIN) 100 MG capsule Take 1 capsule (100 mg total) by mouth 2 (two) times daily. Additional capsule as needed once daily for pain, total maximum daily dose 3 capsules (300 mg) 09/03/22  Yes Sunnie Nielsen, DO  glucose 4 GM chewable tablet Chew 1 tablet (4 g total) by mouth as needed for low blood sugar. 07/01/22  Yes Merwyn Katos, MD  insulin NPH-regular Human (NOVOLIN 70/30) (70-30) 100 UNIT/ML injection Inject 28 Units into the skin 2 (two) times daily with a meal. 07/01/22  Yes Bradler, Clent Jacks, MD  levETIRAcetam (KEPPRA) 1000 MG tablet Take 1 tablet (1,000 mg total) by mouth daily. 07/01/22  Yes Merwyn Katos, MD  lidocaine (LIDODERM) 5 % Place 1 patch onto the skin daily. Remove & Discard patch within 12 hours or as directed by MD 07/01/22  Yes Merwyn Katos, MD  losartan (COZAAR) 100 MG tablet Take 1 tablet (100 mg total) by mouth at bedtime. 07/01/22 10/12/22 Yes Merwyn Katos, MD  metoprolol succinate (TOPROL-XL) 100 MG 24 hr tablet Take 1 tablet (100 mg total) by mouth daily. 07/01/22 10/12/22 Yes Merwyn Katos, MD  omeprazole (PRILOSEC) 20 MG capsule Take 1 capsule (20 mg total) by mouth 2 (two) times daily. 07/01/22 10/12/22 Yes Bradler, Clent Jacks, MD  polyethylene glycol (MIRALAX / GLYCOLAX) 17 g packet Take 17 g by mouth daily. 09/06/22  Yes Sunnie Nielsen, DO  senna-docusate (SENOKOT-S) 8.6-50 MG tablet Take 2 tablets by mouth at bedtime as needed for mild constipation or moderate constipation. 09/06/22  Yes Sunnie Nielsen, DO  sertraline (ZOLOFT) 25 MG tablet Take 1 tablet (25 mg total) by mouth daily. 07/01/22 10/12/22  Yes Merwyn Katos, MD  torsemide (DEMADEX) 100 MG tablet Take 1 tablet (100 mg total) by mouth daily. 07/01/22 10/12/22 Yes Bradler, Clent Jacks, MD  traMADol (ULTRAM) 50 MG tablet Take 50 mg by mouth 2 (two) times daily. 09/28/22  Yes [provider]  traZODone (DESYREL) 50 MG tablet Take 1 tablet (50 mg total) by mouth at bedtime. 07/01/22 10/12/22 Yes Merwyn Katos, MD  vitamin D3 (CHOLECALCIFEROL) 25 MCG tablet Take 1 tablet (1,000 Units total) by mouth daily. 07/01/22  Yes Merwyn Katos, MD  levETIRAcetam (KEPPRA) 250 MG tablet Take 1 tablet (250 mg total) by mouth 3 (three) times a week. 07/01/22 08/26/22  Merwyn Katos, MD  umeclidinium-vilanterol (ANORO ELLIPTA) 62.5-25 MCG/ACT AEPB Inhale 1 puff into the lungs daily. Patient not taking: Reported on 10/13/2022 09/06/22   Sunnie Nielsen,  DO    Social History   Socioeconomic History   Marital status: Single    Spouse name: Not on file   Number of children: 5   Years of education: Not on file   Highest education level: Not on file  Occupational History   Occupation: Disability   Tobacco Use   Smoking status: Former    Packs/day: 0    Types: Cigarettes   Smokeless tobacco: Never  Vaping Use   Vaping Use: Never used  Substance and Sexual Activity   Alcohol use: No   Drug use: No   Sexual activity: Not Currently  Other Topics Concern   Not on file  Social History Narrative   Live with Fiance   Social Determinants of Health   Financial Resource Strain: Low Risk  (11/12/2017)   Overall Financial Resource Strain (CARDIA)    Difficulty of Paying Living Expenses: Not hard at all  Food Insecurity: No Food Insecurity (10/12/2022)   Hunger Vital Sign    Worried About Running Out of Food in the Last Year: Never true    Ran Out of Food in the Last Year: Never true  Transportation Needs: No Transportation Needs (10/12/2022)   PRAPARE - Administrator, Civil Service (Medical): No    Lack of Transportation  (Non-Medical): No  Physical Activity: Insufficiently Active (11/12/2017)   Exercise Vital Sign    Days of Exercise per Week: 5 days    Minutes of Exercise per Session: 20 min  Stress: No Stress Concern Present (11/12/2017)   [employer] of Occupational Health - Occupational Stress Questionnaire    Feeling of Stress : Not at all  Social Connections: Moderately Integrated (11/12/2017)   Social Connection and Isolation Panel [NHANES]    Frequency of Communication with Friends and Family: Twice a week    Frequency of Social Gatherings with Friends and Family: Twice a week    Attends Religious Services: More than 4 times per year    Active Member of [employer] or Organizations: Yes    Attends Engineer, structural: More than 4 times per year    Marital Status: Never married  Intimate Partner Violence: Not At Risk (10/12/2022)   Humiliation, Afraid, Rape, and Kick questionnaire    Fear of Current or Ex-Partner: No    Emotionally Abused: No    Physically Abused: No    Sexually Abused: No     Family History  Problem Relation Age of Onset   Breast cancer Father 15   Stroke Mother    Heart attack Mother    Heart Problems Sister    Breast cancer Cousin 67       1 st cousin. Maternal     ROS: Otherwise negative unless mentioned in HPI  Physical Examination  Vitals:   11/09/22 1005 11/09/22 1010  BP:  (!) 105/58  Pulse: 82 80  Resp: 17 17  Temp:  97.8 F (36.6 C)  SpO2: 95% 95%   Body mass index is 26.94 kg/m.  General:  WDWN in NAD Gait: Not observed HENT: WNL, normocephalic Pulmonary: normal non-labored breathing, without Rales, rhonchi,  wheezing Cardiac: regular, without  Murmurs, rubs or gallops; without carotid bruits Abdomen: Positive bowel sounds, soft, NT/ND, no masses Skin: without rashes Vascular Exam/Pulses: Palpable throughout  Extremities: without ischemic changes, without Gangrene , without cellulitis; without open wounds;  Musculoskeletal: no muscle  wasting or atrophy  Neurologic: A&O X 3;  No focal weakness or paresthesias are detected; speech  is fluent/normal. Noted dementia from time to time. Daughter called to get consent Psychiatric:  The pt has Normal affect. Lymph:  Unremarkable  CBC    Component Value Date/Time   WBC 7.6 11/09/2022 0618   RBC 4.23 11/09/2022 0618   HGB 10.2 (L) 11/09/2022 0618   HGB 9.5 (L) 06/17/2014 0538   HCT 34.5 (L) 11/09/2022 0618   HCT 29.6 (L) 06/17/2014 0538   PLT 148 (L) 11/09/2022 0618   PLT 173 06/17/2014 0538   MCV 81.6 11/09/2022 0618   MCV 92 06/17/2014 0538   MCH 24.1 (L) 11/09/2022 0618   MCHC 29.6 (L) 11/09/2022 0618   RDW 21.0 (H) 11/09/2022 0618   RDW 16.2 (H) 06/17/2014 0538   LYMPHSABS 2.2 11/09/2022 0618   LYMPHSABS 2.5 06/17/2014 0538   MONOABS 0.8 11/09/2022 0618   MONOABS 1.0 (H) 06/17/2014 0538   EOSABS 0.2 11/09/2022 0618   EOSABS 0.2 06/17/2014 0538   BASOSABS 0.1 11/09/2022 0618   BASOSABS 0.1 06/17/2014 0538    BMET    Component Value Date/Time   NA 139 11/09/2022 0618   NA 135 (L) 06/16/2014 0915   K 4.1 11/09/2022 0618   K 4.8 06/16/2014 0915   CL 101 11/09/2022 0618   CL 100 06/16/2014 0915   CO2 25 11/09/2022 0618   CO2 22 06/16/2014 0915   GLUCOSE 184 (H) 11/09/2022 0618   GLUCOSE 226 (H) 06/16/2014 0915   BUN 49 (H) 11/09/2022 0618   BUN 47 (H) 06/16/2014 0915   CREATININE 9.52 (H) 11/09/2022 0618   CREATININE 7.60 (H) 06/16/2014 0915   CALCIUM 9.8 11/09/2022 0618   CALCIUM 7.2 (L) 06/16/2014 0915   GFRNONAA 4 (L) 11/09/2022 0618   GFRNONAA 6 (L) 06/16/2014 0915   GFRNONAA 6 (L) 04/07/2014 0918   GFRAA 5 (L) 01/30/2020 0500   GFRAA 7 (L) 06/16/2014 0915   GFRAA 7 (L) 04/07/2014 0918    COAGS: Lab Results  Component Value Date   INR 1.7 (H) 03/24/2022   INR 1.2 03/19/2022   INR 1.1 07/30/2019     Non-Invasive Vascular Imaging:    OPERATIVE NOTE     PROCEDURE: 1.  Left brachial artery to axillary vein arteriovenous graft  cannulation under ultrasound guidance 2.  Left arm shuntogram 3.  Percutaneous transluminal angioplasty of the venous anastomosis and graft with a 5 mm diameter by 15 cm length Lutonix drug-coated angioplasty balloon 4.  Stent placement to the AV graft with 8 mm diameter by 15 cm length Viabahn stent   PRE-OPERATIVE DIAGNOSIS: 1. ESRD 2. Malfunctioning left brachial artery to axillary vein arteriovenous graft   POST-OPERATIVE DIAGNOSIS: same as above    SURGEON: Festus Barren, MD   ANESTHESIA: local with MCS   ESTIMATED BLOOD LOSS: 10 cc   FINDING(S): Mangled stents within the AV graft with near occlusive stenosis and chronic thrombus.  There is also about a 75 to 80% stenosis of the venous anastomosis and multiple veins draining the outflow at the axillary vein  Statin:  Yes.   Beta Blocker:  Yes.   Aspirin:  No. ACEI:  No. ARB:  No. CCB use:  Yes Other antiplatelets/anticoagulants:  No.    ASSESSMENT/PLAN: This is a 72 y.o. female with a nonoperating AV fistula.  On 09/22/2022 she underwent a left arm shuntogram.  Transluminal angioplasty of the venous anastomosis and graft were completed.  A stent was placed to the AV graft at that time.  The findings upon the fistulogram were  mangled stents within the AV graft near the occlusive stenosis with a chronic thrombus.  He may be the cause of her nonoperative AV shunt again.  I discussed in detail today with the patient's power of attorney, daughter Seona Clemenson the procedure, benefits, risks, and complications.  She understands completely and verbalizes her understanding.  She reiterates that her mother just had this procedure done back in February.  She gives her consent today to proceed with the procedure.  Patient was made n.p.o. after midnight.   -I discussed the plan in detail with Dr. Festus Barren MD and he agrees with the plan.   Marcie Bal [facility] 11/09/2022 11:10 AM

## 2022-11-09 NOTE — Progress Notes (Signed)
Physical Therapy Treatment Patient Details Name: Melora Prawdzik MRN: [id number] DOB: 02/12/1951 Today's Date: 11/09/2022   History of Present Illness Issabella Lakshana Drumwright is a 72 y.o. 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 She presents to the ED with worsening lower extremity pain.    PT Comments    Pt was long sitting in bed upon arrival. She is A and agreeable to session however author questions pt's effort throughout. Pt was able to achieve EOB short sit with max assist of one however upon sitting up EOB, starts yelling out "help me, I can't do it, leave me alone!" Pt was total assisted back into long sitting. PT is extremely anxious and fearful with any/all mobility. Discussed with pt needing to increase activity to promote return in abilities while improving confidence. She will require continued skilled PT to maximize her independence while decreasing caregiver burden.    Recommendations for follow up therapy are one component of a multi-disciplinary discharge planning process, led by the attending physician.  Recommendations may be updated based on patient status, additional functional criteria and insurance authorization.     Assistance Recommended at Discharge Frequent or constant Supervision/Assistance  Patient can return home with the following Two people to help with walking and/or transfers;A lot of help with bathing/dressing/bathroom;Assistance with cooking/housework;Direct supervision/assist for medications management;Direct supervision/assist for financial management;Assist for transportation;Help with stairs or ramp for entrance   Equipment Recommendations  None recommended by PT       Precautions / Restrictions Precautions Precautions: Fall Precaution Comments: R chest port; Restrictions Weight Bearing Restrictions: No      Mobility  Bed Mobility Overal bed mobility: Needs Assistance Bed Mobility: Supine to Sit, Sit to Supine  Supine to sit: Max assist, HOB elevated Sit to supine: Total assist General bed mobility comments: Pt was able to progress BLEs towards EOB but still ended up requiring Max assist to achieve EOB short sit. Upon sitting up EOB, pt yells out," Help me, let me go, I can't do it."    Transfers  General transfer comment: Currently unable/unsafe to attempt     Balance Overall balance assessment: Needs assistance Sitting-balance support: Bilateral upper extremity supported, Feet supported Sitting balance-Leahy Scale: Poor Sitting balance - Comments: pt has poor sitting balance however author questions if fear/anxiety limiting her moreso than physical deficits  Standing balance comment: Unable       Cognition Arousal/Alertness: Awake/alert Behavior During Therapy: Anxious Overall Cognitive Status: No family/caregiver present to determine baseline cognitive functioning Area of Impairment: Orientation, Safety/judgement, Awareness, Problem solving, Following commands    Orientation Level: Disoriented to, Place, Time, Situation    General Comments: Pt is A and agreeable to session however severely limited by anxiety/fear." I'm scared."               Pertinent Vitals/Pain Pain Assessment Pain Assessment: No/denies pain Pain Score: 0-No pain     PT Goals (current goals can now be found in the care plan section) Acute Rehab PT Goals Patient Stated Goal: none stated Progress towards PT goals: Progressing toward goals    Frequency    Min 2X/week      PT Plan Current plan remains appropriate       AM-PAC PT "6 Clicks" Mobility   Outcome Measure  Help needed turning from your back to your side while in a flat bed without using bedrails?: A Lot Help needed moving  from lying on your back to sitting on the side of a flat bed without using bedrails?: A Lot Help needed  moving to and from a bed to a chair (including a wheelchair)?: Total Help needed standing up from a chair using your arms (e.g., wheelchair or bedside chair)?: Total Help needed to walk in hospital room?: Total Help needed climbing 3-5 steps with a railing? : Total 6 Click Score: 8    End of Session   Activity Tolerance: Other (comment) (limited by fear and anxiety) Patient left: in bed;with call bell/phone within reach;with bed alarm set Nurse Communication: Mobility status;Precautions PT Visit Diagnosis: Unsteadiness on feet (R26.81);Other abnormalities of gait and mobility (R26.89);Muscle weakness (generalized) (M62.81);Difficulty in walking, not elsewhere classified (R26.2)     Time: 1191-4782 PT Time Calculation (min) (ACUTE ONLY): 12 min  Charges:  $Therapeutic Activity: 8-22 mins                    Jetta Lout PTA 11/09/22, 3:09 PM

## 2022-11-09 NOTE — Progress Notes (Addendum)
Received patient in bed to unit., awake, calm and coopertive.   Informed consent signed and in chart.    TX duration:3.5 ordered,  Actual time  1hr , Pressure alarms and TMP indicated  clotting,    Able to rinse back very little blood.    Transported By hospital transported back to room  Hand-off given to patient's nurse, Grier Mitts RN   Access used: L AVG Access issues: Arterial and Venous sites clotted, with clots pulled from both lines when disconnected.No Bruit or thrill post tx. NP S. Breeze aware and @ bedside, needles pulled and pt returned to room.   Total UF removed: 500 ml Medication(s) given  None Post HD VS: Stable,B/P soft at times Post HD weight: 90.1kg    [facility]

## 2022-11-09 NOTE — Progress Notes (Signed)
[facility]  ROUNDING NOTE   Subjective:   Cynthia Dean is a 72 y.o. 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 She presents to the ED with worsening lower extremity pain. Patient will be admitted for Back pain [M54.9] Bilateral leg pain [M79.604, M79.605]  Update Patient seen and evaluated during dialysis   HEMODIALYSIS FLOWSHEET:  Blood Flow Rate (mL/min): 400 mL/min Arterial Pressure (mmHg): -160 mmHg Venous Pressure (mmHg): 150 mmHg TMP (mmHg): 11 mmHg Ultrafiltration Rate (mL/min): 686 mL/min Dialysate Flow Rate (mL/min): 300 ml/min Dialysis Fluid Bolus: Normal Saline Bolus Amount (mL): 100 mL (uf still off)  Tolerating treatment lying in bed No complaints to offer  HD RN reports patient provided dialysis system after receiving 2 hours of treatment.  When attempting to continue dialysis treatment, patient access negative for bruit or thrill.  Objective:  Vital signs in last 24 hours:  Temp:  [97.8 F (36.6 C)-98.5 F (36.9 C)] 97.8 F (36.6 C) (04/17 1010) Pulse Rate:  [66-85] 80 (04/17 1010) Resp:  [12-22] 17 (04/17 1010) BP: (90-145)/(54-88) 105/58 (04/17 1010) SpO2:  [93 %-99 %] 95 % (04/17 1010) Weight:  [90.1 kg-90.8 kg] 90.1 kg (04/17 1043)  Weight change:  Filed Weights   11/07/22 1134 11/09/22 0801 11/09/22 1043  Weight: 90.3 kg 90.8 kg 90.1 kg    Intake/Output: I/O last 3 completed shifts: In: 534.8 [P.O.:480; IV Piggyback:54.8] Out: -    Intake/Output this shift:  No intake/output data recorded.  Physical Exam: General: NAD  Head: Normocephalic, atraumatic.  Moist oral mucosal membranes  Eyes: Anicteric  Lungs:  Clear to auscultation  Heart: Regular rate and rhythm  Abdomen:  Soft, nontender  Extremities: No peripheral edema.  Neurologic: Somnolent   Skin: No lesions  Access: Left AVG  (no bruit/thrill))    Basic Metabolic Panel: Recent Labs  Lab 11/05/22 0600 11/06/22 0541 11/07/22 0610 11/08/22 0430 11/09/22 0618  NA 139 140 142 141 139  K 3.6 3.2* 4.4 3.8 4.1  CL 99 104 103 101 101  CO2 GLUCOSE 182* 158* 162* 152* 184*  BUN 27* 34* 49* 40* 49*  CREATININE 6.09* 6.97* 9.04* 7.53* 9.52*  CALCIUM 9.3 8.7* 9.7 9.0 9.8  MG  --   --  1.8  --   --   PHOS  --   --  6.4*  --   --      Liver Function Tests: No results for input(s): "AST", "ALT", "ALKPHOS", "BILITOT", "PROT", "ALBUMIN" in the last 168 hours.  No results for input(s): "LIPASE", "AMYLASE" in the last 168 hours. No results for input(s): "AMMONIA" in the last 168 hours.  CBC: Recent Labs  Lab 11/05/22 0600 11/06/22 0541 11/07/22 0610 11/08/22 0430 11/09/22 0618  WBC 8.3 7.2 6.6 7.1 7.6  NEUTROABS 5.0 3.7 3.5 3.5 4.3  HGB 10.1* 9.7* 10.8* 9.6* 10.2*  HCT 33.8* 33.4* 37.1 32.4* 34.5*  MCV 81.3 82.1 82.1 81.6 81.6  PLT 194 189 180 143* 148*     Cardiac Enzymes: No results for input(s): "CKTOTAL", "CKMB", "CKMBINDEX", "TROPONINI" in the last 168 hours.  BNP: Invalid input(s): "POCBNP"  CBG: No results for input(s): "GLUCAP" in the last 168 hours.   Microbiology: Results for orders placed or performed during the hospital encounter of 10/12/22  Blood culture (routine x 2)     Status: None   Collection Time: 10/12/22  5:40 PM   Specimen: BLOOD  Result Value Ref Range Status   Specimen Description BLOOD BLOOD RIGHT HAND [facility]  Final   Special Requests   Final    BOTTLES DRAWN AEROBIC AND ANAEROBIC Blood Culture results may not be optimal due to an inadequate volume of blood received in culture bottles   Culture   Final    NO GROWTH 5 DAYS Performed at [facility], [address] [place], [place] [address]    Report Status 10/17/2022 FINAL  Final  Blood culture (routine x 2)     Status: None   Collection Time: 10/12/22  6:56 PM   Specimen: BLOOD  Result  Value Ref Range Status   Specimen Description BLOOD BLOOD RIGHT FOREARM  Final   Special Requests   Final    BOTTLES DRAWN AEROBIC ONLY Blood Culture results may not be optimal due to an inadequate volume of blood received in culture bottles   Culture   Final    NO GROWTH 5 DAYS Performed at [facility], [address], [place], [place] [address]    Report Status 10/17/2022 FINAL  Final  Aerobic/Anaerobic Culture w Gram Stain (surgical/deep wound)     Status: None   Collection Time: 10/13/22 11:44 AM   Specimen: Abscess  Result Value Ref Range Status   Specimen Description   Final    ABSCESS Performed at [facility], [address] [place], [place] [address]    Special Requests   Final    MUSCLE ABSCESS Performed at [facility], [address] [place], [place] [address]    Gram Stain   Final    ABUNDANT WBC PRESENT, PREDOMINANTLY PMN NO ORGANISMS SEEN    Culture   Final    FEW STAPHYLOCOCCUS CAPITIS NO ANAEROBES ISOLATED Performed at [facility], [address], [place], [place] [address]    Report Status 10/18/2022 FINAL  Final   Organism ID, Bacteria STAPHYLOCOCCUS CAPITIS  Final      Susceptibility   Staphylococcus capitis - MIC*    CIPROFLOXACIN <=0.5 SENSITIVE Sensitive     ERYTHROMYCIN >=8 RESISTANT Resistant     GENTAMICIN <=0.5 SENSITIVE Sensitive     OXACILLIN <=0.25 SENSITIVE Sensitive     TETRACYCLINE <=1 SENSITIVE Sensitive     VANCOMYCIN 1 SENSITIVE Sensitive     TRIMETH/SULFA <=10 SENSITIVE Sensitive     CLINDAMYCIN INTERMEDIATE Intermediate     RIFAMPIN <=0.5 SENSITIVE Sensitive     Inducible Clindamycin NEGATIVE Sensitive     * FEW STAPHYLOCOCCUS CAPITIS    Coagulation Studies: No results for input(s): "LABPROT", "INR" in the last 72 hours.  Urinalysis: No results for input(s): "COLORURINE", "LABSPEC", "PHURINE", "GLUCOSEU", "HGBUR", "BILIRUBINUR", "KETONESUR", "PROTEINUR", "UROBILINOGEN", "NITRITE",  "LEUKOCYTESUR" in the last 72 hours.  Invalid input(s): "APPERANCEUR"    Imaging: No results found.   Medications:    anticoagulant sodium citrate      ceFAZolin (ANCEF) IV      ceFAZolin (ANCEF) IV Stopped (11/02/22 1430)    ceFAZolin (ANCEF) IV Stopped (11/04/22 1244)   methocarbamol (ROBAXIN) IV Stopped (11/08/22 1455)    (feeding supplement) PROSource Plus  30 mL Oral BID BM   Chlorhexidine Gluconate Cloth  6 each Topical Q0600   cinacalcet  30 mg Oral Q supper   feeding supplement (NEPRO CARB STEADY)  237 mL Oral TID BM   fentaNYL  1 patch Transdermal Q72H   gabapentin  100 mg Oral Daily   Gerhardt's butt  cream   Topical QID   levETIRAcetam  1,000 mg Oral Daily   levETIRAcetam  250 mg Oral Q M,W,F   metoprolol succinate  100 mg Oral Daily   pantoprazole  20 mg Oral BID   psyllium  1 packet Oral Daily   QUEtiapine  50 mg Oral QHS   sertraline  25 mg Oral Daily   sodium chloride flush  10-40 mL Intracatheter Q12H   umeclidinium-vilanterol  1 puff Inhalation Daily   alteplase, anticoagulant sodium citrate, fentaNYL (SUBLIMAZE) injection, hydrALAZINE, lidocaine (PF), lidocaine-prilocaine, LORazepam, methocarbamol (ROBAXIN) IV, ondansetron, senna-docusate, sodium chloride flush  Assessment/ Plan:  Ms. Kainani Leal is a 72 y.o.  female with end stage renal disease on hemodialysis, coronary artery disease, COPD with intermittent home O2, hypertension, hyperlipidemia, stroke, diabetes mellitus type II, and breast cancer who was admitted to [facility] on 10/12/2022 for Back pain [M54.9] Bilateral leg pain [M79.604, M79.605]  Patient was found to have vertebral osteomyelitis and psoas abscess. Cultures positive for staph capitis  [facility] MWF Left AVG 102.5kg  End-stage renal disease on hemodialysis. Resumed on MWF schedule.    Patient received 2 hours of treatment today prior to clotting system.  When attempting to restart treatment, access noted to have no  bruit or thrill.  Vascular surgery consulted to evaluate access.  Next treatment scheduled for Friday.  2. Anemia of chronic kidney disease Normocytic Lab Results  Component Value Date   HGB 10.2 (L) 11/09/2022  Patient receives Mircera at outpatient clinic.  Hemoglobin within desired range.  Will hold low-dose EPO.  3. Secondary Hyperparathyroidism: with outpatient labs: PTH 180 (3/11), phosphorus 5.4, calcium 9.3   Lab Results  Component Value Date   PTH 131 (H) 03/14/2018   CALCIUM 9.8 11/09/2022   CAION 1.09 (L) 08/28/2022   PHOS 6.4 (H) 11/07/2022   -Continue Cinacalcet. -Will continue to monitor bone minerals - Awaiting updated phosphorus level.  4.  Hypotension on hemodialysis treatment. Holding home regimen of losartan, metoprolol and torsemide. Blood pressure soft during dialysis, 90/62.  5. Diabetes mellitus type II with chronic kidney disease/renal manifestations: insulin dependent.  Diet controlled at this time.  6. Vertebral osteomyelitis, seen on CT lumber spine. Neurosurgery feels patient is not a surgical candidate CT guided aspiration of psoas abscess on 10/13/22. Cultures positive for Staph Capitis.   -Infectious disease recommending cefazolin with dialysis treatments. 8-week course in progress.     LOS: 28   4/17/202411:11 AM

## 2022-11-09 NOTE — Progress Notes (Signed)
Progress Note   [name] WUJ:[id number] DOB: 07-08-1951 DOA: 10/12/2022     28 DOS: the patient was seen and examined on 11/09/2022    Brief hospital course:   Cynthia Dean is a 72 y/o F w/ PMH of ESRD on HD, COPD, DM2, HTN, depression, CVA who presented w/ back pain x 3 weeks.  Pt presented w/ back pain and was found to intradiscal abscess/lumbar discitis/b/l psoas abscess on MRI status post IR guided drainage. Patient has had waxing and waning mental status with intermittently refusing dialysis. Patient's family have decided to continue hemodialysis after speaking with their PCP.     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  Wound cx growing staph capitis.  ID consulted.  Abx de-escalated to cefazolin.  Not a good surgical candidate as per neuro surg.   Pt having severe low back and bilateral leg pain --cont cefazolin, for 6 weeks, End Date: 12/02/22 --pain management -- trial Norco, given ESRD will use IV fentanyl PRN and before dialysis      Confusion and agitation Hospital delirium --Pt was agitated, and also not responding to questions or commands during early hospitalization.  Mental status did start to improve, and now pt is calm and interactive, however, at this time she has been having waxing and waning mental status --Delirium precautions --cont seroquel 50 mg nightly (new) to help with delirium and sleep.     ESRD: on HD MWF.  Pt refused HD on 10/17/22, 10/18/22 and 3/27, 4/1.   Since then, has been agreeable to dialysis without further issues, getting dialysis in hospital bed. When attempts made to do HD in chair, she did not tolerate on account of severe back pain --Recommend consideration for home hemodialysis if no facility capable to do HD  in the bed    Goals of Care  4/10 per Dr. Meriam Sprague: I understand goals of care were discussed on 11/01/2022 with patient as well as patient's family at bedside and they had understanding that there was no other option for hemodialysis without being able to sit in the recliner and so they went with the option of hospice.  Patient's family had discussion with patient's PCP who made him aware that since her low back pain is due to acute findings of discitis of L4-L5 vertebra and have not had a chance of adequate treatment of this acute discitis to determine whether her back pain will improve to give her a chance of continuation of hemodialysis, they believe decision for hospice was premature.  I personally spoke with the family as well as patient's PCP.  We are looking at options of getting patient accepted to a hemodialysis capable facility since patient is unable to go to any facility outside those with dialysis capability.  I spoke with patient today and both patient and the family expressed interest to continue hemodialysis.     Generalized weakness:  PT recs SNF. TOC following.   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 H&H are stable    Hypokalemia:  4/8: K 3.6 --monitor and replete PRN    Peripheral neuropathy --  4/4 - Cautious trial of gabapentin 100 mg daily (ESRD - monitor closely for side effects) 4% - Pt notes significant improvement and no side effects so far --Continue low dose gabapentin  and monitor   Hx of seizures:  continue on home dose of Keppra   HLD:  continue on statin    Hx of COPD: w/o exacerbation.  Continue on bronchodilators    DM2: well controlled HbA1c 5.2.  --d/c'ed BG checks    HTN:  --Toprol XL resumed --Continue holding losartan    GERD:  continue on PPI    Depression: severity unknown.  Continue on home dose of sertraline     Overweight: BMI 28.2.  Wt loss efforts recommended   Right foot pain - see peripheral neuropathy     DVT prophylaxis: Heparin  SQ Code Status: Full code    Subjective / Interval history:  Patient seen and examined at bedside this morning She was awake, reported pain control improved with fentanyl patch and expresses gratitude for helping her pain.  She denies other acute complaints.  Case discussed with Nephrology and Palliative Care today.    Family Communication: None present on rounds, will attempt to call.   Nephrology and Palliative Care also in communications with daughter.     Consultants - Nephrology, Infectious Disease, Palliative Care   Level of care: Telemetry Medical   Dispo:   The patient is from: home Anticipated d/c is to: SNF  Anticipated d/c date is: TBD pending GOC decisions        Physical Exam:   General exam: awake alert, no acute distress  Respiratory system: CTAB, no wheezes or rhonchi, on room air, normal respiratory effort. Cardiovascular system: RRR, no peripheral LE edema Gastrointestinal system: soft, NT, ND  Central nervous system: A&O x3. no gross focal neurologic deficits Extremities:  LUE fistula noted, no edema, normal tone Skin: dry, intact, normal temperature Psychiatry: Good mood, congruent affect, judgment and insight intact    Vitals:   11/09/22 1000 11/09/22 1005 11/09/22 1010 11/09/22 1043  BP: (!) 92/56  (!) 105/58   Pulse: 78 82 80   Resp: 18 17 17    Temp:   97.8 F (36.6 C)   TempSrc:   Oral   SpO2: 99% 95% 95%   Weight:    90.1 kg  Height:          Time spent: 35 minutes  Author: Pennie Banter, DO 11/09/2022 1:42 PM  For on call review [email or web address].

## 2022-11-10 ENCOUNTER — Encounter
Admission: EM | Disposition: A | Discharge status: Hospice | Payer: Self-pay | Source: Home / Self Care | Attending: Internal Medicine

## 2022-11-10 ENCOUNTER — Ambulatory Visit (INDEPENDENT_AMBULATORY_CARE_PROVIDER_SITE_OTHER): Payer: Self-pay | Admitting: Nurse Practitioner

## 2022-11-10 ENCOUNTER — Encounter (INDEPENDENT_AMBULATORY_CARE_PROVIDER_SITE_OTHER): Payer: 59

## 2022-11-10 DIAGNOSIS — K6812 Psoas muscle abscess: Secondary | ICD-10-CM | POA: Diagnosis not present

## 2022-11-10 DIAGNOSIS — Z7189 Other specified counseling: Secondary | ICD-10-CM | POA: Diagnosis not present

## 2022-11-10 DIAGNOSIS — N186 End stage renal disease: Secondary | ICD-10-CM | POA: Diagnosis not present

## 2022-11-10 DIAGNOSIS — M79604 Pain in right leg: Secondary | ICD-10-CM | POA: Diagnosis not present

## 2022-11-10 DIAGNOSIS — M4646 Discitis, unspecified, lumbar region: Secondary | ICD-10-CM | POA: Diagnosis not present

## 2022-11-10 LAB — CBC WITH DIFFERENTIAL/PLATELET
Abs Immature Granulocytes: 0.07 10*3/uL (ref 0.00–0.07)
Basophils Absolute: 0.1 10*3/uL (ref 0.0–0.1)
Basophils Relative: 1 %
Eosinophils Absolute: 0.2 10*3/uL (ref 0.0–0.5)
Eosinophils Relative: 3 %
HCT: 33.4 % — ABNORMAL LOW (ref 36.0–46.0)
Hemoglobin: 10 g/dL — ABNORMAL LOW (ref 12.0–15.0)
Immature Granulocytes: 1 %
Lymphocytes Relative: 32 %
Lymphs Abs: 2.3 10*3/uL (ref 0.7–4.0)
MCH: 24.6 pg — ABNORMAL LOW (ref 26.0–34.0)
MCHC: 29.9 g/dL — ABNORMAL LOW (ref 30.0–36.0)
MCV: 82.1 fL (ref 80.0–100.0)
Monocytes Absolute: 0.8 10*3/uL (ref 0.1–1.0)
Monocytes Relative: 11 %
Neutro Abs: 3.7 10*3/uL (ref 1.7–7.7)
Neutrophils Relative %: 52 %
Platelets: 136 10*3/uL — ABNORMAL LOW (ref 150–400)
RBC: 4.07 MIL/uL (ref 3.87–5.11)
RDW: 21.1 % — ABNORMAL HIGH (ref 11.5–15.5)
Smear Review: NORMAL
WBC: 7.1 10*3/uL (ref 4.0–10.5)
nRBC: 0 % (ref 0.0–0.2)

## 2022-11-10 LAB — RENAL FUNCTION PANEL
Albumin: 2.6 g/dL — ABNORMAL LOW (ref 3.5–5.0)
Anion gap: 12 (ref 5–15)
BUN: 51 mg/dL — ABNORMAL HIGH (ref 8–23)
CO2: 25 mmol/L (ref 22–32)
Calcium: 9.5 mg/dL (ref 8.9–10.3)
Chloride: 98 mmol/L (ref 98–111)
Creatinine, Ser: 9.28 mg/dL — ABNORMAL HIGH (ref 0.44–1.00)
GFR, Estimated: 4 mL/min — ABNORMAL LOW (ref >60)
Glucose, Bld: 177 mg/dL — ABNORMAL HIGH (ref 70–99)
Phosphorus: 6 mg/dL — ABNORMAL HIGH (ref 2.5–4.6)
Potassium: 4.4 mmol/L (ref 3.5–5.1)
Sodium: 135 mmol/L (ref 135–145)

## 2022-11-10 LAB — SEDIMENTATION RATE: Sed Rate: 59 mm/hr — ABNORMAL HIGH (ref 0–30)

## 2022-11-10 LAB — C-REACTIVE PROTEIN: CRP: 0.8 mg/dL (ref <1.0)

## 2022-11-10 SURGERY — A/V FISTULAGRAM
Anesthesia: Moderate Sedation

## 2022-11-10 MED ORDER — FENTANYL 12 MCG/HR TD PT72
1.0000 | MEDICATED_PATCH | TRANSDERMAL | Status: DC
  Administered 2022-11-10: 1 via TRANSDERMAL
  Filled 2022-11-10: qty 1

## 2022-11-10 MED ORDER — CEFAZOLIN SODIUM-DEXTROSE 1-4 GM/50ML-% IV SOLN
1.0000 g | Freq: Every day | INTRAVENOUS | Status: AC
  Administered 2022-11-11 – 2022-11-13 (×3): 1 g via INTRAVENOUS
  Filled 2022-11-10 (×3): qty 50

## 2022-11-10 MED ORDER — CEFAZOLIN SODIUM-DEXTROSE 1-4 GM/50ML-% IV SOLN
INTRAVENOUS | Status: AC
  Filled 2022-11-10: qty 50

## 2022-11-10 MED ORDER — SODIUM CHLORIDE 0.9 % IV SOLN: INTRAVENOUS | Status: DC

## 2022-11-10 MED ORDER — CHLORHEXIDINE GLUCONATE CLOTH 2 % EX PADS
6.0000 | MEDICATED_PAD | Freq: Every day | CUTANEOUS | Status: DC
  Administered 2022-11-11 – 2022-11-12 (×2): 6 via TOPICAL

## 2022-11-10 NOTE — Progress Notes (Addendum)
Daily Progress Note   Patient Name: Cynthia Dean       Date: 11/10/2022 DOB: [birth year]  Age: 72 y.o. MRN#: [id number] Attending Physician: Pennie Banter, DO Primary Care Physician: Loistine Chance, MD Admit Date: 10/12/2022  Reason for Consultation/Follow-up: Establishing goals of care  Subjective: Notes and labs reviewed.  In to see patient.  She has covers pulled over her head.  Eyes open immediately to voice.  Attempted to speak with her, and inquire about how she is doing, and about her pain, and what she wanted to do moving forward.  She did not answer any of these questions.  She finally spoke to say "go on and leave me alone".     Attempted to call daughter unsuccessfully.  Epic chat in place that patient refused to allow procedure with vascular surgery.  We attempted to call daughter unsuccessfully.  Epic chat with attending.  Hydrocodone changed back to fentanyl patch.  ADDENDUM: Spoke with daughter to discuss status and provide updates.  She is appreciative for this and states she will talk to her aunts about care moving forward.  Discussed that I would call her again tomorrow.  Length of Stay: 29  Current Medications: Scheduled Meds:   (feeding supplement) PROSource Plus  30 mL Oral BID BM   Chlorhexidine Gluconate Cloth  6 each Topical Q0600   cinacalcet  30 mg Oral Q supper   feeding supplement (NEPRO CARB STEADY)  237 mL Oral TID BM   fentaNYL  1 patch Transdermal Q72H   gabapentin  100 mg Oral Once per day on Mon Wed Fri   Gerhardt's butt cream   Topical QID   levETIRAcetam  1,000 mg Oral Daily   levETIRAcetam  250 mg Oral Q M,W,F   metoprolol succinate  100 mg Oral Daily   pantoprazole  20 mg Oral BID   psyllium  1 packet Oral Daily   QUEtiapine   50 mg Oral QHS   sertraline  25 mg Oral Daily   sodium chloride flush  10-40 mL Intracatheter Q12H   umeclidinium-vilanterol  1 puff Inhalation Daily    Continuous Infusions:  anticoagulant sodium citrate      ceFAZolin (ANCEF) IV      ceFAZolin (ANCEF) IV Stopped (11/09/22 1203)    ceFAZolin (ANCEF) IV Stopped (11/04/22  1244)   methocarbamol (ROBAXIN) IV Stopped (11/08/22 1455)    PRN Meds: alteplase, anticoagulant sodium citrate, fentaNYL (SUBLIMAZE) injection, hydrALAZINE, lidocaine (PF), lidocaine-prilocaine, LORazepam, methocarbamol (ROBAXIN) IV, ondansetron, senna-docusate, sodium chloride flush  Physical Exam Pulmonary:     Effort: Pulmonary effort is normal.  Neurological:     Mental Status: She is alert.             Vital Signs: BP 99/60 (BP Location: Right Arm)   Pulse 71   Temp 97.8 F (36.6 C)   Resp 20   Ht 6' (1.829 m)   Wt 90.1 kg   SpO2 95%   BMI 26.94 kg/m  SpO2: SpO2: 95 % O2 Device: O2 Device: Room Air O2 Flow Rate: O2 Flow Rate (L/min): 3 L/min  Intake/output summary:  Intake/Output Summary (Last 24 hours) at 11/10/2022 1243 Last data filed at 11/09/2022 2118 Gross per 24 hour  Intake 120.06 ml  Output --  Net 120.06 ml   LBM: Last BM Date : 11/09/22 Baseline Weight: Weight: 113.4 kg Most recent weight: Weight: 90.1 kg       Patient Active Problem List   Diagnosis Date Noted   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/17/2022   Back pain 10/12/2022   Discitis of lumbar region 10/12/2022   Myositis 10/12/2022   Hemoglobin drop 08/28/2022   Chronic respiratory failure with hypoxia 08/26/2022   Acute on chronic blood loss anemia 08/26/2022   History of lower GI bleeding 08/26/2022   Insulin-requiring or dependent type II diabetes mellitus 08/26/2022   Chronic anticoagulation 08/26/2022   History of pulmonary embolism 03/20/2022 08/26/2022   Breast cancer, left S/P partial mastectomy,03/04/2022 08/26/2022   Generalized weakness    Unable to ambulate 05/09/2022    ESRD on dialysis    Bilateral leg pain    Depression    Pulmonary emboli    Obesity with body mass index (BMI) of 30.0 to 39.9    Anemia    Chronic diastolic CHF (congestive heart failure)    Seroma of breast    Bacteremia due to coagulase-negative Staphylococcus    Left breast abscess    Urinary tract infection 03/22/2022   Anemia of chronic disease 03/22/2022   Acute pulmonary embolism without acute cor pulmonale 03/20/2022   Dyslipidemia 03/20/2022   Diabetic neuropathy 03/20/2022   Sepsis due to cellulitis 03/20/2022   Delay kidney tx func d/t fluid overload requiring acute dialysis 01/29/2020   Delay kidney tx func d/t ATN and fluid overload require acute dialysis 01/29/2020   Hypoglycemia 10/21/2019   Unresponsiveness 10/21/2019   GERD (gastroesophageal reflux disease) 10/21/2019   Hypothermia    Sepsis with encephalopathy without septic shock    Diarrhea    Acute metabolic encephalopathy    Acute on chronic diastolic CHF (congestive heart failure)    Acute on chronic respiratory failure with hypoxia 07/30/2019   Rectal bleed 02/13/2019   Acute respiratory failure with hypoxia 12/31/2018   History of CVA (cerebrovascular accident) 05/29/2018   Aphasia as late effect of cerebrovascular accident (CVA) 05/01/2018   Seizure disorder 03/14/2018   Stroke 03/07/2018   Slurred speech 11/14/2017   Type 2 diabetes mellitus with ESRD (end-stage renal disease) 11/12/2017   CAD (coronary artery disease) 11/12/2017   COPD (chronic obstructive pulmonary disease) 11/12/2017   ESRD on hemodialysis 11/12/2017   CVA (cerebral vascular accident) 04/14/2016   Essential hypertension 11/27/2015   Hyperlipidemia 11/27/2015   Prolonged Q-T interval on ECG 11/26/2015   Aphasia complicating stroke 11/05/2015   Cervical  spine arthritis 07/27/2015   Diabetic retinopathy without macular edema associated with type 2 diabetes mellitus 07/27/2015   Osteoarthritis of spine with radiculopathy,  lumbar region 07/27/2015   Obesity, Class III, BMI 40-49.9 (morbid obesity) 07/27/2015   Vitamin D deficiency 01/21/2011   Diastolic heart failure 10/27/2010    Palliative Care Assessment & Plan   Recommendations/Plan:  Attempted to reach daughter x 2 unsuccessfully.  PMT will follow  ADDENDUM: Updated daughter, she will speak with her aunts about care moving forward.   Code Status:    Code Status Orders  (From admission, onward)           Start     Ordered   11/01/22 1535  Do not attempt resuscitation (DNR)  Continuous       Question Answer Comment  If patient has no pulse and is not breathing Do Not Attempt Resuscitation   If patient has a pulse and/or is breathing: Medical Treatment Goals COMFORT MEASURES: Keep clean/warm/dry, use medication by any route; positioning, wound care and other measures to relieve pain/suffering; use oxygen, suction/manual treatment of airway obstruction for comfort; do not transfer unless for comfort needs.   Consent: Discussion documented in EHR or advanced directives reviewed      11/01/22 1534           Code Status History     Date Active Date Inactive Code Status Order ID Comments User Context   10/12/2022 1101 11/01/2022 1534 Full Code [id number]  Charise Killian, MD Inpatient   08/26/2022 2018 09/09/2022 2244 Full Code [id number]  Andris Baumann, MD ED   05/09/2022 0919 05/17/2022 0109 Full Code [id number]  Lorretta Harp, MD ED   03/20/2022 0121 04/02/2022 0126 Full Code [id number]  Mansy, Vernetta Honey, MD ED   01/29/2020 2038 01/30/2020 2215 Full Code [id number]  Lucile Shutters, MD Inpatient   01/29/2020 1210 01/29/2020 2038 Full Code [id number]  Lucile Shutters, MD Inpatient   10/21/2019 0902 10/25/2019 2211 Full Code [id number]  Lorretta Harp, MD ED   07/30/2019 2224 08/11/2019 2130 Full Code [id number]  Mansy, Vernetta Honey, MD ED   02/13/2019 1329 02/16/2019 1849 Full Code [id number]  Jimmye Norman, NP ED   12/31/2018 1159 01/02/2019 1330 Full Code [id number]   Jimmye Norman, NP ED   05/29/2018 1541 05/30/2018 2041 Full Code [id number]  Alford Highland, MD ED   03/14/2018 0141 03/15/2018 0336 Full Code [id number]  Cammy Copa, MD Inpatient   03/08/2018 0005 03/09/2018 2108 Full Code [id number]  Altamese Dilling, MD Inpatient   11/15/2017 0017 11/17/2017 1809 Full Code [id number]  Oralia Manis, MD ED   11/12/2017 2219 11/13/2017 1935 Full Code [id number]  Oralia Manis, MD ED   04/14/2016 1330 04/14/2016 1700 Full Code [id number]  Adrian Saran, MD ED   08/08/2015 2257 08/09/2015 2054 Full Code [id number]  Wyatt Haste, MD ED   04/17/2015 1334 04/17/2015 1836 Full Code [id number]  Annice Needy, MD Inpatient      Advance Directive Documentation    Flowsheet Row Most Recent Value  Type of Advance Directive Healthcare Power of Attorney  Pre-existing out of facility DNR order (yellow form or pink MOST form) --  "MOST" Form in Place? --       Prognosis: Poor overall  Care plan was discussed with care team including vascular surgery and attending team.  Thank you for allowing the Palliative Medicine Team to assist in the care of this patient.   Morton Stall, NP  Please contact Palliative  Medicine Team phone at [phone] for questions and concerns.

## 2022-11-10 NOTE — Progress Notes (Signed)
Patient became very agitated and combative in Pre-op prior to procedure. Patient was unwilling to let nurses start prep work for the procedure. Therefore procedure was cancelled for today. Pre-op nurse called and left message with patients daughter to make her aware of the patients actions and the cancellation of the procedure. Currently the patient does not have adequate dialysis access. Daughter made aware.

## 2022-11-10 NOTE — Progress Notes (Signed)
Refused AM vitals.

## 2022-11-10 NOTE — Consult Note (Addendum)
Pharmacy Antibiotic Note  Cynthia Dean is a 72 y.o. female admitted on 10/12/2022 with  discitis and bilateral psoas abscess .  Pharmacy has been consulted for cefazolin dosing. Of note, patient receives hemodialysis MWF outpatient. Plan is to continue regimen while inpatient. IR aspiration of abscess on 3/21 with culture growing Methicillin-susceptible S. Capitis.   Today, 11/10/2022 Day #29 antibiotics Renal: ESRD on HD MWF WBC WNL Afebrile Psoas abscess cx: Methicillin-susc S capitis Unable to complete HD on 4/15 and 4/17 d/t access issues, plan for fistulogram 4/18 but cancelled d/t patient agitation.    Plan Ongoing issues with receiving routine HD, change cefazolin to 1gm IV qhs This will start 4/19 at 2200 as received cefazolin 2gm on 4/17 so this is when next dose would be due OPAT completed for 2g-2g-3g - needs to f/u plans for HD  Height: 6' (182.9 cm) Weight: 90.1 kg (198 lb 10.2 oz) IBW/kg (Calculated) : 73.1  Temp (24hrs), Avg:97.9 F (36.6 C), Min:97.4 F (36.3 C), Max:98.4 F (36.9 C)  Recent Labs  Lab 11/05/22 0600 11/06/22 0541 11/07/22 0610 11/08/22 0430 11/09/22 0618 11/10/22 0453  WBC 8.3 7.2 6.6 7.1 7.6 7.1  CREATININE 6.09* 6.97* 9.04* 7.53* 9.52*  --      Estimated Creatinine Clearance: 6.8 mL/min (A) (by C-G formula based on SCr of 9.52 mg/dL (H)).    Allergies  Allergen Reactions   Oxycodone Nausea And Vomiting   Oxycodone-Acetaminophen Other (See Comments)   Wound Dressing Adhesive    Hydrocodone     Intolerant more than allergic   Tape Itching    Skin Dermatitis/itching (tape adhesive) Skin Dermatitis/itching (tape adhesive)   Tapentadol Itching    Skin Dermatitis/itching (tape adhesive) Skin Dermatitis/itching (tape adhesive)     Antimicrobials this admission: Vancomycin 3/21 >> 3/26 Cefepime 3/21 >> 3/25 Cefazolin 3/26 >>  Dose adjustments this admission: ESRD  Microbiology results: 3/20 BCx: NGTD 3/21 Abscess cx:  meth-susc Staph capitis  Thank you for allowing pharmacy to be a part of this patient's care.  Juliette Alcide, PharmD, BCPS, BCIDP Work Cell: [phone] 11/10/2022 4:55 PM

## 2022-11-10 NOTE — Progress Notes (Signed)
OT Cancellation Note  Patient Details Name: Cynthia Dean MRN: [id number] DOB: 26-Aug-1950   Cancelled Treatment:    Reason Eval/Treat Not Completed: Patient declined, stating she was too tired at present. Will attempt OT at a later time/date, as pt is available and willing to participate.  [name] 11/10/2022, 10:30 AM

## 2022-11-10 NOTE — Interval H&P Note (Signed)
History and Physical Interval Note:  11/10/2022 10:52 AM  Yona Charletta Cousin  has presented today for surgery, with the diagnosis of ESRD.  The various methods of treatment have been discussed with the patient and family. After consideration of risks, benefits and other options for treatment, the patient has consented to  Procedure(s): A/V Fistulagram (N/A) DIALYSIS/PERMA CATHETER INSERTION (N/A) as a surgical intervention.  The patient's history has been reviewed, patient examined, no change in status, stable for surgery.  I have reviewed the patient's chart and labs.  Questions were answered to the patient's satisfaction.     Festus Barren

## 2022-11-10 NOTE — Care Management Important Message (Signed)
Important Message  Patient Details  Name: Cynthia Dean MRN: [id number] Date of Birth: June 19, 1951   Medicare Important Message Given:  Yes     Olegario Messier A Sheryl Saintil 11/10/2022, 11:29 AM

## 2022-11-10 NOTE — Progress Notes (Signed)
[facility]  ROUNDING NOTE   Subjective:   Cynthia Dean is a 72 y.o. 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 She presents to the ED with worsening lower extremity pain. Patient will be admitted for Back pain [M54.9] Bilateral leg pain [M79.604, M79.605]  Update Patient laying in bed, no family at bedside Somnolent  Room air No lower extremity edema noted  Objective:  Vital signs in last 24 hours:  Temp:  [97.8 F (36.6 C)-98.4 F (36.9 C)] 97.8 F (36.6 C) (04/18 0438) Pulse Rate:  [71-85] 71 (04/18 0438) Resp:  [16-20] 20 (04/18 0438) BP: (99-120)/(60-62) 99/60 (04/18 0438) SpO2:  [93 %-98 %] 95 % (04/18 0438)  Weight change:  Filed Weights   11/07/22 1134 11/09/22 0801 11/09/22 1043  Weight: 90.3 kg 90.8 kg 90.1 kg    Intake/Output: I/O last 3 completed shifts: In: 540.1 [P.O.:540; IV Piggyback:0.1] Out: -    Intake/Output this shift:  No intake/output data recorded.  Physical Exam: General: NAD  Head: Normocephalic, atraumatic.  Moist oral mucosal membranes  Eyes: Anicteric  Lungs:  Clear to auscultation  Heart: Regular rate and rhythm  Abdomen:  Soft, nontender  Extremities: No peripheral edema.  Neurologic: Somnolent   Skin: No lesions  Access: Left AVG (no bruit/thrill))    Basic Metabolic Panel: Recent Labs  Lab 11/05/22 0600 11/06/22 0541 11/07/22 0610 11/08/22 0430 11/09/22 0618  NA 139 140 142 141 139  K 3.6 3.2* 4.4 3.8 4.1  CL 99 104 103 101 101  CO2 28 25 26 29 25   GLUCOSE 182* 158* 162* 152* 184*  BUN 27* 34* 49* 40* 49*  CREATININE 6.09* 6.97* 9.04* 7.53* 9.52*  CALCIUM 9.3 8.7* 9.7 9.0 9.8  MG  --   --  1.8  --   --   PHOS  --   --  6.4*  --  6.3*     Liver Function Tests: No results for input(s): "AST", "ALT", "ALKPHOS", "BILITOT", "PROT", "ALBUMIN" in the last 168  hours.  No results for input(s): "LIPASE", "AMYLASE" in the last 168 hours. No results for input(s): "AMMONIA" in the last 168 hours.  CBC: Recent Labs  Lab 11/06/22 0541 11/07/22 0610 11/08/22 0430 11/09/22 0618 11/10/22 0453  WBC 7.2 6.6 7.1 7.6 7.1  NEUTROABS 3.7 3.5 3.5 4.3 3.7  HGB 9.7* 10.8* 9.6* 10.2* 10.0*  HCT 33.4* 37.1 32.4* 34.5* 33.4*  MCV 82.1 82.1 81.6 81.6 82.1  PLT 189 180 143* 148* 136*     Cardiac Enzymes: No results for input(s): "CKTOTAL", "CKMB", "CKMBINDEX", "TROPONINI" in the last 168 hours.  BNP: Invalid input(s): "POCBNP"  CBG: No results for input(s): "GLUCAP" in the last 168 hours.   Microbiology: Results for orders placed or performed during the hospital encounter of 10/12/22  Blood culture (routine x 2)     Status: None   Collection Time: 10/12/22  5:40 PM   Specimen: BLOOD  Result Value Ref Range Status   Specimen Description BLOOD BLOOD RIGHT HAND [facility]  Final   Special Requests   Final    BOTTLES DRAWN AEROBIC AND ANAEROBIC Blood Culture results may not be optimal due to an inadequate volume of blood received in culture bottles   Culture   Final    NO GROWTH 5 DAYS Performed at [facility], [address], [place], [place] [address]    Report Status 10/17/2022  FINAL  Final  Blood culture (routine x 2)     Status: None   Collection Time: 10/12/22  6:56 PM   Specimen: BLOOD  Result Value Ref Range Status   Specimen Description BLOOD BLOOD RIGHT FOREARM  Final   Special Requests   Final    BOTTLES DRAWN AEROBIC ONLY Blood Culture results may not be optimal due to an inadequate volume of blood received in culture bottles   Culture   Final    NO GROWTH 5 DAYS Performed at [facility], [address] [place], [place] [address]    Report Status 10/17/2022 FINAL  Final  Aerobic/Anaerobic Culture w Gram Stain (surgical/deep wound)     Status: None   Collection Time: 10/13/22 11:44 AM   Specimen: Abscess  Result  Value Ref Range Status   Specimen Description   Final    ABSCESS Performed at [facility], [address] [place], [place] [address]    Special Requests   Final    MUSCLE ABSCESS Performed at [facility], [address], [place], [place] [address]    Gram Stain   Final    ABUNDANT WBC PRESENT, PREDOMINANTLY PMN NO ORGANISMS SEEN    Culture   Final    FEW STAPHYLOCOCCUS CAPITIS NO ANAEROBES ISOLATED Performed at [facility], [address], [place], [place] [address]    Report Status 10/18/2022 FINAL  Final   Organism ID, Bacteria STAPHYLOCOCCUS CAPITIS  Final      Susceptibility   Staphylococcus capitis - MIC*    CIPROFLOXACIN <=0.5 SENSITIVE Sensitive     ERYTHROMYCIN >=8 RESISTANT Resistant     GENTAMICIN <=0.5 SENSITIVE Sensitive     OXACILLIN <=0.25 SENSITIVE Sensitive     TETRACYCLINE <=1 SENSITIVE Sensitive     VANCOMYCIN 1 SENSITIVE Sensitive     TRIMETH/SULFA <=10 SENSITIVE Sensitive     CLINDAMYCIN INTERMEDIATE Intermediate     RIFAMPIN <=0.5 SENSITIVE Sensitive     Inducible Clindamycin NEGATIVE Sensitive     * FEW STAPHYLOCOCCUS CAPITIS    Coagulation Studies: No results for input(s): "LABPROT", "INR" in the last 72 hours.  Urinalysis: No results for input(s): "COLORURINE", "LABSPEC", "PHURINE", "GLUCOSEU", "HGBUR", "BILIRUBINUR", "KETONESUR", "PROTEINUR", "UROBILINOGEN", "NITRITE", "LEUKOCYTESUR" in the last 72 hours.  Invalid input(s): "APPERANCEUR"    Imaging: No results found.   Medications:    anticoagulant sodium citrate      ceFAZolin (ANCEF) IV      ceFAZolin (ANCEF) IV Stopped (11/09/22 1203)    ceFAZolin (ANCEF) IV Stopped (11/04/22 1244)   methocarbamol (ROBAXIN) IV Stopped (11/08/22 1455)    (feeding supplement) PROSource Plus  30 mL Oral BID BM   Chlorhexidine Gluconate Cloth  6 each Topical Q0600   cinacalcet  30 mg Oral Q supper   feeding supplement (NEPRO CARB STEADY)  237 mL Oral TID BM   gabapentin   100 mg Oral Once per day on Mon Wed Fri   Gerhardt's butt cream   Topical QID   levETIRAcetam  1,000 mg Oral Daily   levETIRAcetam  250 mg Oral Q M,W,F   metoprolol succinate  100 mg Oral Daily   pantoprazole  20 mg Oral BID   psyllium  1 packet Oral Daily   QUEtiapine  50 mg Oral QHS   sertraline  25 mg Oral Daily   sodium chloride flush  10-40 mL Intracatheter Q12H   umeclidinium-vilanterol  1 puff Inhalation Daily   alteplase, anticoagulant sodium citrate, fentaNYL (SUBLIMAZE)  injection, hydrALAZINE, lidocaine (PF), lidocaine-prilocaine, LORazepam, methocarbamol (ROBAXIN) IV, ondansetron, senna-docusate, sodium chloride flush  Assessment/ Plan:  [name] is a 72 y.o.  female with end stage renal disease on hemodialysis, coronary artery disease, COPD with intermittent home O2, hypertension, hyperlipidemia, stroke, diabetes mellitus type II, and breast cancer who was admitted to [facility] on 10/12/2022 for Back pain [M54.9] Bilateral leg pain [M79.604, M79.605]  Patient was found to have vertebral osteomyelitis and psoas abscess. Cultures positive for staph capitis  [facility] MWF Left AVG 102.5kg  End-stage renal disease on hemodialysis. Resumed on MWF schedule.    Treatment terminated after 2 hours yesterday due to HD access malfunctions.  Vascular plan to perform fistulogram today however patient uncooperative.  Even with attempts to place PermCath, patient reported to be agitated and combative.  Next dialysis treatment was scheduled for Friday, this will depend if functioning HD access available.  Labs remained stable at this time.  2. Anemia of chronic kidney disease Normocytic Lab Results  Component Value Date   HGB 10.0 (L) 11/10/2022  Patient receives Mircera at outpatient clinic.  Hemoglobin remains acceptable.  No need for ESA at this time.  3. Secondary Hyperparathyroidism: with outpatient labs: PTH 180 (3/11), phosphorus 5.4, calcium 9.3   Lab  Results  Component Value Date   PTH 131 (H) 03/14/2018   CALCIUM 9.8 11/09/2022   CAION 1.09 (L) 08/28/2022   PHOS 6.3 (H) 11/09/2022   -Continue Cinacalcet. -Calcium acceptable, phosphorus remains elevated.  4.  Hypotension on hemodialysis treatment. Holding home regimen of losartan, metoprolol and torsemide. Blood pressure soft during dialysis, blood pressure 99/60 during the night.  5. Diabetes mellitus type II with chronic kidney disease/renal manifestations: insulin dependent.  Diet controlled at this time.  6. Vertebral osteomyelitis, seen on CT lumber spine. Neurosurgery feels patient is not a surgical candidate CT guided aspiration of psoas abscess on 10/13/22. Cultures positive for Staph Capitis.   -Infectious disease recommending cefazolin with dialysis treatments. 8-week course in progress.  -Due to malfunctioning HD access, will notify pharmacy to dose antibiotics at bedside.     LOS: 29   4/18/202412:28 PM

## 2022-11-10 NOTE — Progress Notes (Signed)
Patient came down for procedure. When attempts were made to get her prepped, she refused and tried to hit staff. POA and vascular surgeon notified. Procedure will be rescheduled.

## 2022-11-10 NOTE — Progress Notes (Signed)
Progress Note   [name]:[id number] DOB: 11-23-1950 DOA: 10/12/2022     29 DOS: the patient was seen and examined on 11/10/2022    Brief hospital course:   Cynthia Dean is a 72 y/o F w/ PMH of ESRD on HD, COPD, DM2, HTN, depression, CVA who presented w/ back pain x 3 weeks.  Pt presented w/ back pain and was found to intradiscal abscess/lumbar discitis/b/l psoas abscess on MRI status post IR guided drainage. Patient has had waxing and waning mental status with intermittently refusing dialysis. Patient's family have decided to continue hemodialysis after speaking with their PCP.   Hospital course prolonged, complicated by patient's inability to tolerate sitting in recliner chair for dialysis.  She only agrees to dialysis in the bed.  This is a barrier to discharge as no outpatient dialysis centers can accommodate.          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  Wound cx growing staph capitis.  ID consulted.  Abx de-escalated to cefazolin.  Not a good surgical candidate as per neuro surg.   Pt having severe low back and bilateral leg pain --cont cefazolin, for 6 weeks, End Date: 12/02/22 --pain management per orders -- palliative care following --transitioned from fentanyl patch + IV PRN >>> scheduled + PRN Norco on 4/17, since staff have not been using PRN fentanyl doses apparently for concern of side effects and/or hypotension.  Pain has remained uncontrolled, patient cannot tolerate any position except laying nearly flat in the bed     Confusion and agitation Hospital delirium --Pt was agitated, and also not responding to questions or commands during early hospitalization.  Mental status did start to improve, and now pt is calm and interactive,  however, at this time she has been having waxing and waning mental status --Delirium precautions --cont seroquel 50 mg nightly (new) to help with delirium and sleep.     ESRD: on HD MWF.  Pt refused HD on 10/17/22, 10/18/22 and 3/27, 4/1.   Since then, has been agreeable to dialysis without further issues, getting dialysis in hospital bed. When attempts made to do HD in chair, she did not tolerate on account of severe back pain --Recommend consideration for home hemodialysis if no facility capable to do HD in the bed    Goals of Care  4/10 per Dr. Meriam Sprague: I understand goals of care were discussed on 11/01/2022 with patient as well as patient's family at bedside and they had understanding that there was no other option for hemodialysis without being able to sit in the recliner and so they went with the option of hospice.  Patient's family had discussion with patient's PCP who made him aware that since her low back pain is due to acute findings of discitis of L4-L5 vertebra and have not had a chance of adequate treatment of this acute discitis to determine whether her back pain will improve to give her a chance of continuation of hemodialysis, they believe decision for hospice was premature.  I personally spoke with the family as well as patient's PCP.  We are looking at options of getting patient accepted to a hemodialysis capable facility since patient is unable to go to any facility outside those with dialysis capability.  I spoke with patient  today and both patient and the family expressed interest to continue hemodialysis.     Generalized weakness / Physical Debility:  PT recs SNF. TOC following. Pt is bedridden and very high risk for developing pressure injuries of skin -- reposition frequently.   Monitor pressures areas on skin and offload as possible.   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 H&H are stable    Hypokalemia:  4/8: K 3.6 --monitor and replete PRN    Peripheral neuropathy --  4/4 -  Cautious trial of gabapentin 100 mg daily (ESRD - monitor closely for side effects) 4% - Pt notes significant improvement and no side effects so far --Continue low dose gabapentin and monitor   Hx of seizures:  continue on home dose of Keppra   HLD:  continue on statin    Hx of COPD: w/o exacerbation.  Continue on bronchodilators    DM2: well controlled HbA1c 5.2.  --d/c'ed BG checks    HTN:  --Toprol XL resumed --Continue holding losartan    GERD:  continue on PPI    Depression: severity unknown.  Continue on home dose of sertraline     Overweight: BMI 28.2.  Wt loss efforts recommended   Right foot pain - see peripheral neuropathy     DVT prophylaxis: Heparin SQ Code Status: Full code    Subjective / Interval history:  Patient seen and examined at bedside this morning She was about to be transported for shuntogram procedure since her fistula clotted off again during dialysis yesterday.  Her pain regimen was adjusted yesterday.. RN at bedside reports patient declined all her oral meds including pain medication this AM.   When I ask patient if her pain is controlled, she says "let me go.  Just let me go". But she does not answer how much pain she has.      Family Communication: None present on rounds, will attempt to call.   Nephrology and Palliative Care also in communications with daughter.     Consultants - Nephrology, Infectious Disease, Palliative Care   Level of care: Telemetry Medical   Dispo:   The patient is from: home Anticipated d/c is to: SNF  Anticipated d/c date is: TBD pending GOC decisions        Physical Exam:   General exam: awake alert, no acute distress, minimally interactive today Respiratory system: on room air, normal respiratory effort. Cardiovascular system: RRR, no peripheral LE edema Gastrointestinal system: soft, NT, ND  Central nervous system: alert, oriented to self and place at least. no gross focal neurologic  deficits Extremities:  LUE fistula noted, no edema, normal tone Skin: dry, intact, normal temperature Psychiatry: depressed mood, congruent affect    Vitals:   11/09/22 1043 11/09/22 1551 11/09/22 2009 11/10/22 0438  BP:  120/62 115/61 99/60  Pulse:  85 84 71  Resp:  Temp:  98.2 F (36.8 C) 98.4 F (36.9 C) 97.8 F (36.6 C)  TempSrc:  Oral Oral   SpO2:  98% 93% 95%  Weight: 90.1 kg     Height:          Time spent: 25 minutes  Author: Pennie Banter, DO 11/10/2022 11:34 AM  For on call review [email or web address].

## 2022-11-10 NOTE — Plan of Care (Signed)
  Problem: Education: Goal: Individualized Educational Video(s) Outcome: Progressing   Problem: Coping: Goal: Ability to adjust to condition or change in health will improve Outcome: Progressing   Problem: Fluid Volume: Goal: Ability to maintain a balanced intake and output will improve Outcome: Progressing   Problem: Metabolic: Goal: Ability to maintain appropriate glucose levels will improve Outcome: Progressing   Problem: Nutritional: Goal: Maintenance of adequate nutrition will improve Outcome: Progressing Goal: Progress toward achieving an optimal weight will improve Outcome: Progressing   Problem: Skin Integrity: Goal: Risk for impaired skin integrity will decrease Outcome: Progressing   Problem: Tissue Perfusion: Goal: Adequacy of tissue perfusion will improve Outcome: Progressing   Problem: Clinical Measurements: Goal: Ability to maintain clinical measurements within normal limits will improve Outcome: Progressing Goal: Will remain free from infection Outcome: Progressing Goal: Diagnostic test results will improve Outcome: Progressing Goal: Respiratory complications will improve Outcome: Progressing Goal: Cardiovascular complication will be avoided Outcome: Progressing   Problem: Activity: Goal: Risk for activity intolerance will decrease Outcome: Progressing   Problem: Nutrition: Goal: Adequate nutrition will be maintained Outcome: Progressing   Problem: Coping: Goal: Level of anxiety will decrease Outcome: Progressing   Problem: Elimination: Goal: Will not experience complications related to bowel motility Outcome: Progressing Goal: Will not experience complications related to urinary retention Outcome: Progressing   Problem: Pain Managment: Goal: General experience of comfort will improve Outcome: Progressing   Problem: Safety: Goal: Ability to remain free from injury will improve Outcome: Progressing   Problem: Skin Integrity: Goal:  Risk for impaired skin integrity will decrease Outcome: Progressing   Problem: Education: Goal: Ability to describe self-care measures that may prevent or decrease complications (Diabetes Survival Skills Education) will improve Outcome: Not Progressing Note: Patient experiencing confusion at this time. Will continue to reorient and educate as needed.   Problem: Health Behavior/Discharge Planning: Goal: Ability to identify and utilize available resources and services will improve Outcome: Not Progressing Note: Patient experiencing confusion at this time. Will continue to reorient and educate as needed. Goal: Ability to manage health-related needs will improve Outcome: Not Progressing Note: Patient experiencing confusion at this time. Will continue to reorient and educate as needed.   Problem: Education: Goal: Knowledge of General Education information will improve Description: Including pain rating scale, medication(s)/side effects and non-pharmacologic comfort measures Outcome: Not Progressing Note: Patient experiencing confusion at this time. Will continue to reorient and educate as needed.   Problem: Health Behavior/Discharge Planning: Goal: Ability to manage health-related needs will improve Outcome: Not Progressing Note: Patient experiencing confusion at this time. Will continue to reorient and educate as needed.

## 2022-11-11 ENCOUNTER — Encounter
Admission: EM | Disposition: A | Discharge status: Hospice | Payer: Self-pay | Source: Home / Self Care | Attending: Internal Medicine

## 2022-11-11 DIAGNOSIS — R410 Disorientation, unspecified: Secondary | ICD-10-CM | POA: Diagnosis not present

## 2022-11-11 DIAGNOSIS — K6812 Psoas muscle abscess: Secondary | ICD-10-CM | POA: Diagnosis not present

## 2022-11-11 DIAGNOSIS — Z7189 Other specified counseling: Secondary | ICD-10-CM | POA: Diagnosis not present

## 2022-11-11 DIAGNOSIS — N186 End stage renal disease: Secondary | ICD-10-CM | POA: Diagnosis not present

## 2022-11-11 DIAGNOSIS — M4646 Discitis, unspecified, lumbar region: Secondary | ICD-10-CM | POA: Diagnosis not present

## 2022-11-11 DIAGNOSIS — Z992 Dependence on renal dialysis: Secondary | ICD-10-CM | POA: Diagnosis not present

## 2022-11-11 DIAGNOSIS — T82868A Thrombosis of vascular prosthetic devices, implants and grafts, initial encounter: Secondary | ICD-10-CM | POA: Diagnosis not present

## 2022-11-11 HISTORY — PX: TEMPORARY DIALYSIS CATHETER: CATH118312

## 2022-11-11 LAB — CBC WITH DIFFERENTIAL/PLATELET
Abs Immature Granulocytes: 0.04 10*3/uL (ref 0.00–0.07)
Basophils Absolute: 0.1 10*3/uL (ref 0.0–0.1)
Basophils Relative: 1 %
Eosinophils Absolute: 0.3 10*3/uL (ref 0.0–0.5)
Eosinophils Relative: 3 %
HCT: 30.5 % — ABNORMAL LOW (ref 36.0–46.0)
Hemoglobin: 9.1 g/dL — ABNORMAL LOW (ref 12.0–15.0)
Immature Granulocytes: 1 %
Lymphocytes Relative: 30 %
Lymphs Abs: 2.5 10*3/uL (ref 0.7–4.0)
MCH: 24.5 pg — ABNORMAL LOW (ref 26.0–34.0)
MCHC: 29.8 g/dL — ABNORMAL LOW (ref 30.0–36.0)
MCV: 82 fL (ref 80.0–100.0)
Monocytes Absolute: 0.9 10*3/uL (ref 0.1–1.0)
Monocytes Relative: 10 %
Neutro Abs: 4.6 10*3/uL (ref 1.7–7.7)
Neutrophils Relative %: 55 %
Platelets: 129 10*3/uL — ABNORMAL LOW (ref 150–400)
RBC: 3.72 MIL/uL — ABNORMAL LOW (ref 3.87–5.11)
RDW: 21 % — ABNORMAL HIGH (ref 11.5–15.5)
WBC: 8.3 10*3/uL (ref 4.0–10.5)
nRBC: 0 % (ref 0.0–0.2)

## 2022-11-11 SURGERY — A/V FISTULAGRAM
Anesthesia: Moderate Sedation

## 2022-11-11 SURGERY — TEMPORARY DIALYSIS CATHETER
Anesthesia: Moderate Sedation

## 2022-11-11 MED ORDER — LIDOCAINE HCL (PF) 1 % IJ SOLN
INTRAMUSCULAR | Status: DC | PRN
  Administered 2022-11-11: 20 mL

## 2022-11-11 MED ORDER — QUETIAPINE FUMARATE 25 MG PO TABS
25.0000 mg | ORAL_TABLET | Freq: Every day | ORAL | Status: DC
  Administered 2022-11-11 – 2023-01-09 (×61): 25 mg via ORAL
  Filled 2022-11-11 (×61): qty 1

## 2022-11-11 MED ORDER — FENTANYL 12 MCG/HR TD PT72
1.0000 | MEDICATED_PATCH | TRANSDERMAL | Status: DC
  Administered 2022-11-13 – 2022-11-25 (×5): 1 via TRANSDERMAL
  Filled 2022-11-11 (×5): qty 1

## 2022-11-11 SURGICAL SUPPLY — 1 items: KIT DIALYSIS CATH TRI 30X13 (CATHETERS) IMPLANT

## 2022-11-11 NOTE — Progress Notes (Signed)
PT Cancellation Note  Patient Details Name: Cynthia Dean MRN: [id number] DOB: [birth year]   Cancelled Treatment:    Reason Eval/Treat Not Completed: Patient at procedure or test/unavailable, will attempt to see pt at a future date/time as medically appropriate.     Ovidio Hanger PT, DPT 11/11/22, 3:54 PM

## 2022-11-11 NOTE — Progress Notes (Signed)
Occupational Therapy Treatment Patient Details Name: Cynthia Dean MRN: [id number] DOB: January 04, 1951 Today's Date: 11/11/2022   History of present illness Cynthia Dean is a 72 y.o. 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 She presents to the ED with worsening lower extremity pain.   OT comments  Cynthia Dean was seen for OT treatment on this date. Upon arrival to room, pt awake, semi-supine in bed leaning to L side. Pt adamantly declines offers for OOB/EOB functional activity during session. Agreeable to basic, bed level ADL management. Requires SET UP assist to perform face washing with bed in chair position. She then declines any other ADL tasks during session despite education on importance of functional activity and discussion of activity modifications to maximize comfort. Session concluded at that time. Pt returned to semi-supine position. Declines offer to assist with reposition after x1 attempt with max assist to shift shoulders more upright. Pt making limited progress toward OT goals, however, continues to benefit from skilled OT services to maximize return to PLOF and minimize risk of future falls, injury, caregiver burden, and readmission. Will continue to follow POC. Continue to anticipate the need for follow up OT services upon hospital DC.     Recommendations for follow up therapy are one component of a multi-disciplinary discharge planning process, led by the attending physician.  Recommendations may be updated based on patient status, additional functional criteria and insurance authorization.    Assistance Recommended at Discharge Frequent or constant Supervision/Assistance  Patient can return home with the following  Two people to help with walking and/or transfers;Help with stairs or ramp for entrance;Assistance with  cooking/housework;Assist for transportation;Direct supervision/assist for financial management;Direct supervision/assist for medications management;Two people to help with bathing/dressing/bathroom   Equipment Recommendations  None recommended by OT    Recommendations for Other Services      Precautions / Restrictions Precautions Precautions: Fall Restrictions Weight Bearing Restrictions: No       Mobility Bed Mobility               General bed mobility comments: NT, pt adamantly declines offers for OOB/EOB activity.    Transfers                         Balance Overall balance assessment: Needs assistance                                         ADL either performed or assessed with clinical judgement   ADL Overall ADL's : Needs assistance/impaired     Grooming: Bed level;Wash/dry face Grooming Details (indicate cue type and reason): Pt performs face washing with bed in chair position. Limited tolerance to upright positioning. States "put me back" after ~ 5 min upright. Refuses offers to re-position in bed for improved comfort.                               General ADL Comments: Continues to be significantly self limiting. Declines all but basic ADL management offered. Declines OOB/EOB activity. Requires MAX A to shift shoulders to midline position this date.    Extremity/Trunk Assessment Upper Extremity Assessment Upper Extremity Assessment: Generalized weakness   Lower Extremity Assessment Lower Extremity Assessment: Generalized weakness  Vision Patient Visual Report: No change from baseline     Perception     Praxis      Cognition Arousal/Alertness: Awake/alert Behavior During Therapy: Flat affect Overall Cognitive Status: No family/caregiver present to determine baseline cognitive functioning                                 General Comments: Pt oriented to self, but has difficulty follow  VCs during session.        Exercises Other Exercises Other Exercises: Pt educated on importance of functional activity during hospital stay, OT facilitated UB adl management as described with education on safety and compensatory strategies t/o session. Pt significantly self-limiting with ADL attempts.    Shoulder Instructions       General Comments      Pertinent Vitals/ Pain       Pain Assessment Faces Pain Scale: Hurts whole lot Pain Location: Denies pain at rest but c/o of abdominal pain with bed in chair position. Limited tolerance to upright positioning. Pain Descriptors / Indicators: Guarding, Grimacing Pain Intervention(s): Limited activity within patient's tolerance, Monitored during session, Repositioned  Home Living                                          Prior Functioning/Environment              Frequency  Min 1X/week        Progress Toward Goals  OT Goals(current goals can now be found in the care plan section)  Progress towards OT goals: Progressing toward goals  Acute Rehab OT Goals Patient Stated Goal: to improve pain OT Goal Formulation: With patient Time For Goal Achievement: 11/17/22 Potential to Achieve Goals: Fair  Plan Discharge plan remains appropriate;Frequency remains appropriate    Co-evaluation                 AM-PAC OT "6 Clicks" Daily Activity     Outcome Measure   Help from another person eating meals?: A Little (based on function. NPO at time of OT session.) Help from another person taking care of personal grooming?: A Little Help from another person toileting, which includes using toliet, bedpan, or urinal?: Total Help from another person bathing (including washing, rinsing, drying)?: Total Help from another person to put on and taking off regular upper body clothing?: A Lot Help from another person to put on and taking off regular lower body clothing?: Total 6 Click Score: 11    End of Session     OT Visit Diagnosis: Other abnormalities of gait and mobility (R26.89);Muscle weakness (generalized) (M62.81)   Activity Tolerance Patient tolerated treatment well   Patient Left in bed;with call bell/phone within reach;with bed alarm set   Nurse Communication Mobility status        Time: 1610-9604 OT Time Calculation (min): 11 min  Charges: OT General Charges $OT Visit: 1 Visit OT Treatments $Self Care/Home Management : 8-22 mins  Rockney Ghee, M.S., OTR/L 11/11/22, 12:35 PM

## 2022-11-11 NOTE — Progress Notes (Addendum)
Progress Note   [name]:[id number] DOB: 1950/12/17 DOA: 10/12/2022     30 DOS: the patient was seen and examined on 11/11/2022    Brief hospital course:   Cynthia Dean is a 72 y/o F w/ PMH of ESRD on HD, COPD, DM2, HTN, depression, CVA who presented w/ back pain x 3 weeks.  Pt presented w/ back pain and was found to intradiscal abscess/lumbar discitis/b/l psoas abscess on MRI status post IR guided drainage. Patient has had waxing and waning mental status with intermittently refusing dialysis. Patient's family have decided to continue hemodialysis after speaking with their PCP.   Hospital course prolonged, complicated by patient's inability to tolerate sitting in recliner chair for dialysis.  She only agrees to dialysis in the bed.  This is a barrier to discharge as no outpatient dialysis centers can accommodate.          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  Wound cx growing staph capitis.  ID consulted.  Abx de-escalated to cefazolin.  Not a good surgical candidate as per neuro surg.   Pt having severe low back and bilateral leg pain --cont cefazolin, for 6 weeks, End Date: 12/02/22 --pain management per orders -- palliative care following --transitioned from fentanyl patch + IV PRN >>> scheduled + PRN Norco on 4/17, since staff have not been using PRN fentanyl doses apparently for concern of side effects and/or hypotension.  Pain has remained uncontrolled, patient cannot tolerate any position except laying nearly flat in the bed     Confusion and agitation Hospital delirium --Pt was agitated, and also not responding to questions or commands during early hospitalization.  Mental status did start to improve, and now pt is calm and interactive,  however, at this time she has been having waxing and waning mental status --Delirium precautions --cont seroquel 50 mg nightly (new) to help with delirium and sleep.     ESRD: on HD MWF.  Pt refused HD on 10/17/22, 10/18/22 and 3/27, 4/1.   Since then, has been agreeable to dialysis without further issues, getting dialysis in hospital bed. When attempts made to do HD in chair, she did not tolerate on account of severe back pain --Recommend consideration for home hemodialysis if no facility capable to do HD in the bed 4/18 --HD access / fistula clotted off during dialysis 4/19 --pt taken for procedure to address fistula malfunction, but was agitated and combative, it was cancelled 4/20 --due to dialysis, no access at this time.  Vascular plans to attempt temp HD cath placement today if pt is amenable.    Goals of Care  4/10 per Dr. Meriam Sprague: I understand goals of care were discussed on 11/01/2022 with patient as well as patient's family at bedside and they had understanding that there was no other option for hemodialysis without being able to sit in the recliner and so they went with the option of hospice.  Patient's family had discussion with patient's PCP who made him aware that since her low back pain is due to acute findings of discitis of L4-L5 vertebra and have not had a chance of adequate treatment of this acute discitis to determine whether her back pain will improve to give her a chance of continuation of hemodialysis, they believe decision for  hospice was premature.  I personally spoke with the family as well as patient's PCP.  We are looking at options of getting patient accepted to a hemodialysis capable facility since patient is unable to go to any facility outside those with dialysis capability.  I spoke with patient today and both patient and the family expressed interest to continue hemodialysis.     Generalized weakness / Physical Debility:  PT recs SNF. TOC following. Pt is bedridden  and very high risk for developing pressure injuries of skin -- reposition frequently.   Monitor pressures areas on skin and offload as possible.   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 H&H are stable    Hypokalemia:  4/8: K 3.6 --monitor and replete PRN    Peripheral neuropathy --  4/4 - Cautious trial of gabapentin 100 mg daily (ESRD - monitor closely for side effects) 4% - Pt notes significant improvement and no side effects so far --Continue low dose gabapentin and monitor   Hx of seizures:  continue on home dose of Keppra   HLD:  continue on statin    Hx of COPD: w/o exacerbation.  Continue on bronchodilators    DM2: well controlled HbA1c 5.2.  --d/c'ed BG checks    HTN:  --Toprol XL resumed --Continue holding losartan    GERD:  continue on PPI    Depression: severity unknown.  Continue on home dose of sertraline     Overweight: BMI 28.2.  Wt loss efforts recommended   Right foot pain - see peripheral neuropathy     DVT prophylaxis: Heparin SQ Code Status: Full code    Subjective / Interval history:  Patient seen and examined at bedside this morning She does not remember getting agitated when taken down for procedure yesterday, and is upset that she cannot recall those events.  She otherwise denies complaints including uncontrolled pain at this time.    Family Communication: None present on rounds, will attempt to call.   Nephrology and Palliative Care also in communications with daughter.     Consultants - Nephrology, Infectious Disease, Palliative Care   Level of care: Telemetry Medical   Dispo:   The patient is from: home Anticipated d/c is to: SNF  Anticipated d/c date is: TBD pending GOC decisions        Physical Exam:   General exam: awake alert, no acute distress, minimally interactive today Respiratory system: on room air, normal respiratory effort. Cardiovascular system: RRR, no peripheral LE edema Gastrointestinal system: soft, NT, ND   Central nervous system: alert, oriented to self and place at least. no gross focal neurologic deficits Extremities:  LUE fistula noted, no edema, normal tone Skin: dry, intact, normal temperature Psychiatry: depressed mood, congruent affect    Vitals:   11/10/22 1553 11/10/22 2052 11/11/22 0526 11/11/22 0812  BP: (!) 111/50 (!) 94/52 (!) 110/55 95/73  Pulse: 78 83 71 70  Resp: Temp: (!) 97.4 F (36.3 C) 98.5 F (36.9 C) 98.8 F (37.1 C) 98.4 F (36.9 C)  TempSrc: Oral     SpO2: 92% 93% 95% 98%  Weight:      Height:          Time spent: 25 minutes  Author: Pennie Banter, DO 11/11/2022 1:14 PM  For on call review [email or web address].

## 2022-11-11 NOTE — Progress Notes (Addendum)
Daily Progress Note   Patient Name: Cynthia Dean       Date: 11/11/2022 DOB: 1951/05/14  Age: 72 y.o. MRN#: [id number] Attending Physician: Pennie Banter, DO Primary Care Physician: Loistine Chance, MD Admit Date: 10/12/2022  Reason for Consultation/Follow-up: Establishing goals of care  Subjective: Notes and labs reviewed.  Patient is currently resting in bed.  She states she is doing okay today and denies pain in her back or sacral area.  Vascular surgery will reattempt taking patient for procedure.  Attempted to call daughter unsuccessfully.   This PMT member will follow-up Tuesday on return to service.  If urgent needs arise please reach out to weekend coverage.  Length of Stay: 30  Current Medications: Scheduled Meds:   (feeding supplement) PROSource Plus  30 mL Oral BID BM   Chlorhexidine Gluconate Cloth  6 each Topical Q0600   Chlorhexidine Gluconate Cloth  6 each Topical Q0600   cinacalcet  30 mg Oral Q supper   feeding supplement (NEPRO CARB STEADY)  237 mL Oral TID BM   [START ON 11/13/2022] fentaNYL  1 patch Transdermal Q72H   gabapentin  100 mg Oral Once per day on Mon Wed Fri   Gerhardt's butt cream   Topical QID   levETIRAcetam  1,000 mg Oral Daily   levETIRAcetam  250 mg Oral Q M,W,F   metoprolol succinate  100 mg Oral Daily   pantoprazole  20 mg Oral BID   psyllium  1 packet Oral Daily   QUEtiapine  50 mg Oral QHS   sertraline  25 mg Oral Daily   sodium chloride flush  10-40 mL Intracatheter Q12H   umeclidinium-vilanterol  1 puff Inhalation Daily    Continuous Infusions:  anticoagulant sodium citrate      ceFAZolin (ANCEF) IV     methocarbamol (ROBAXIN) IV Stopped (11/08/22 1455)    PRN Meds: alteplase, anticoagulant sodium citrate,  fentaNYL (SUBLIMAZE) injection, hydrALAZINE, lidocaine (PF), lidocaine-prilocaine, LORazepam, methocarbamol (ROBAXIN) IV, ondansetron, senna-docusate, sodium chloride flush  Physical Exam Pulmonary:     Effort: Pulmonary effort is normal.  Neurological:     Mental Status: She is alert.             Vital Signs: BP 95/73 (BP Location: Right Arm)   Pulse 70   Temp 98.4 F (36.9  C)   Resp 18   Ht 6' (1.829 m)   Wt 90.1 kg   SpO2 98%   BMI 26.94 kg/m  SpO2: SpO2: 98 % O2 Device: O2 Device: Room Air O2 Flow Rate: O2 Flow Rate (L/min): 3 L/min  Intake/output summary:  Intake/Output Summary (Last 24 hours) at 11/11/2022 1326 Last data filed at 11/11/2022 0955 Gross per 24 hour  Intake 120 ml  Output --  Net 120 ml   LBM: Last BM Date : 11/10/22 Baseline Weight: Weight: 113.4 kg Most recent weight: Weight: 90.1 kg     Patient Active Problem List   Diagnosis Date Noted   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/17/2022   Back pain 10/12/2022   Discitis of lumbar region 10/12/2022   Myositis 10/12/2022   Hemoglobin drop 08/28/2022   Chronic respiratory failure with hypoxia 08/26/2022   Acute on chronic blood loss anemia 08/26/2022   History of lower GI bleeding 08/26/2022   Insulin-requiring or dependent type II diabetes mellitus 08/26/2022   Chronic anticoagulation 08/26/2022   History of pulmonary embolism 03/20/2022 08/26/2022   Breast cancer, left S/P partial mastectomy,03/04/2022 08/26/2022   Generalized weakness    Unable to ambulate 05/09/2022   ESRD on dialysis    Bilateral leg pain    Depression    Pulmonary emboli    Obesity with body mass index (BMI) of 30.0 to 39.9    Anemia    Chronic diastolic CHF (congestive heart failure)    Seroma of breast    Bacteremia due to coagulase-negative Staphylococcus    Left breast abscess    Urinary tract infection 03/22/2022   Anemia of chronic disease 03/22/2022   Acute pulmonary embolism without acute cor pulmonale 03/20/2022    Dyslipidemia 03/20/2022   Diabetic neuropathy 03/20/2022   Sepsis due to cellulitis 03/20/2022   Delay kidney tx func d/t fluid overload requiring acute dialysis 01/29/2020   Delay kidney tx func d/t ATN and fluid overload require acute dialysis 01/29/2020   Hypoglycemia 10/21/2019   Unresponsiveness 10/21/2019   GERD (gastroesophageal reflux disease) 10/21/2019   Hypothermia    Sepsis with encephalopathy without septic shock    Diarrhea    Acute metabolic encephalopathy    Acute on chronic diastolic CHF (congestive heart failure)    Acute on chronic respiratory failure with hypoxia 07/30/2019   Rectal bleed 02/13/2019   Acute respiratory failure with hypoxia 12/31/2018   History of CVA (cerebrovascular accident) 05/29/2018   Aphasia as late effect of cerebrovascular accident (CVA) 05/01/2018   Seizure disorder 03/14/2018   Stroke 03/07/2018   Slurred speech 11/14/2017   Type 2 diabetes mellitus with ESRD (end-stage renal disease) 11/12/2017   CAD (coronary artery disease) 11/12/2017   COPD (chronic obstructive pulmonary disease) 11/12/2017   ESRD on hemodialysis 11/12/2017   CVA (cerebral vascular accident) 04/14/2016   Essential hypertension 11/27/2015   Hyperlipidemia 11/27/2015   Prolonged Q-T interval on ECG 11/26/2015   Aphasia complicating stroke 11/05/2015   Cervical spine arthritis 07/27/2015   Diabetic retinopathy without macular edema associated with type 2 diabetes mellitus 07/27/2015   Osteoarthritis of spine with radiculopathy, lumbar region 07/27/2015   Obesity, Class III, BMI 40-49.9 (morbid obesity) 07/27/2015   Vitamin D deficiency 01/21/2011   Diastolic heart failure 10/27/2010    Palliative Care Assessment & Plan    Recommendations/Plan:  Current plans in place to reattempt dialysis catheter placement.  If the catheter can be placed, patient will resume dialysis, with goal of chair dialysis.  Spoke with  daughter yesterday regarding attempting to provide  care if patient will not allow it, and she is aware of limited options if her mother will not allow staff to provide care for her.  This PMT member will follow-up Tuesday on return to service.  If urgent needs arise please reach out to weekend coverage.    Code Status:    Code Status Orders  (From admission, onward)           Start     Ordered   11/01/22 1535  Do not attempt resuscitation (DNR)  Continuous       Question Answer Comment  If patient has no pulse and is not breathing Do Not Attempt Resuscitation   If patient has a pulse and/or is breathing: Medical Treatment Goals COMFORT MEASURES: Keep clean/warm/dry, use medication by any route; positioning, wound care and other measures to relieve pain/suffering; use oxygen, suction/manual treatment of airway obstruction for comfort; do not transfer unless for comfort needs.   Consent: Discussion documented in EHR or advanced directives reviewed      11/01/22 1534           Code Status History     Date Active Date Inactive Code Status Order ID Comments User Context   10/12/2022 1101 11/01/2022 1534 Full Code [id number]  Charise Killian, MD Inpatient   08/26/2022 2018 09/09/2022 2244 Full Code [id number]  Andris Baumann, MD ED   05/09/2022 0919 05/17/2022 0109 Full Code [id number]  Lorretta Harp, MD ED   03/20/2022 0121 04/02/2022 0126 Full Code [id number]  Mansy, Vernetta Honey, MD ED   01/29/2020 2038 01/30/2020 2215 Full Code [id number]  Lucile Shutters, MD Inpatient   01/29/2020 1210 01/29/2020 2038 Full Code [id number]  Lucile Shutters, MD Inpatient   10/21/2019 0902 10/25/2019 2211 Full Code [id number]  Lorretta Harp, MD ED   07/30/2019 2224 08/11/2019 2130 Full Code [id number]  Mansy, Vernetta Honey, MD ED   02/13/2019 1329 02/16/2019 1849 Full Code [id number]  Jimmye Norman, NP ED   12/31/2018 1159 01/02/2019 1330 Full Code [id number]  Jimmye Norman, NP ED   05/29/2018 1541 05/30/2018 2041 Full Code [id number]  Alford Highland, MD ED   03/14/2018 0141  03/15/2018 0336 Full Code [id number]  Cammy Copa, MD Inpatient   03/08/2018 0005 03/09/2018 2108 Full Code [id number]  Altamese Dilling, MD Inpatient   11/15/2017 0017 11/17/2017 1809 Full Code [id number]  Oralia Manis, MD ED   11/12/2017 2219 11/13/2017 1935 Full Code [id number]  Oralia Manis, MD ED   04/14/2016 1330 04/14/2016 1700 Full Code [id number]  Adrian Saran, MD ED   08/08/2015 2257 08/09/2015 2054 Full Code [id number]  Wyatt Haste, MD ED   04/17/2015 1334 04/17/2015 1836 Full Code [id number]  Annice Needy, MD Inpatient      Advance Directive Documentation    Flowsheet Row Most Recent Value  Type of Advance Directive Healthcare Power of Attorney  Pre-existing out of facility DNR order (yellow form or pink MOST form) --  "MOST" Form in Place? --       Prognosis: Poor overall    Thank you for allowing the Palliative Medicine Team to assist in the care of this patient.   Morton Stall, NP  Please contact Palliative Medicine Team phone at [phone] for questions and concerns.

## 2022-11-11 NOTE — Op Note (Signed)
  OPERATIVE NOTE   PROCEDURE: Ultrasound guidance for vascular access right femoral vein Placement of a 30 cm triple lumen dialysis catheter right femoral vein  PRE-OPERATIVE DIAGNOSIS: 1. ESRD, clotted AVG  POST-OPERATIVE DIAGNOSIS: Same  SURGEON: Festus Barren, MD  ASSISTANT(S): None  ANESTHESIA: local  ESTIMATED BLOOD LOSS: Minimal   FINDING(S): 1.  None  SPECIMEN(S):  None  INDICATIONS:    Patient is a 72 y.o.female who presents with renal failure and a clotted AVG.  She needs a catheter for immediate dialysis.  Risks and benefits were discussed, and informed consent was obtained..  DESCRIPTION: After obtaining full informed written consent, the patient was laid flat in the bed.  The right groin was sterilely prepped and draped in a sterile surgical field was created. The right femoral vein was visualized with ultrasound and found to be widely patent. It was then accessed under direct guidance without difficulty with a Seldinger needle and a permanent image was recorded. A J-wire was then placed. After skin nick and dilatation, a 30 cm triple lumen dialysis catheter was placed over the wire and the wire was removed. The lumens withdrew dark red nonpulsatile blood and flushed easily with sterile saline. The catheter was secured to the skin with 3 nylon sutures. Sterile dressing was placed.  COMPLICATIONS: None  CONDITION: Stable  Festus Barren 11/11/2022 3:29 PM  This note was created with Dragon Medical transcription system. Any errors in dictation are purely unintentional.

## 2022-11-11 NOTE — Progress Notes (Addendum)
[facility]  ROUNDING NOTE   Subjective:   Cynthia Dean is a 72 y.o. 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 She presents to the ED with worsening lower extremity pain. Patient will be admitted for Back pain [M54.9] Bilateral leg pain [M79.604, M79.605]  Update Patient seen laying in bed No family present Alert and oriented to self and place Patient has no memory of refusing vascular procedure or becoming combative with staff.   Objective:  Vital signs in last 24 hours:  Temp:  [97.4 F (36.3 C)-98.8 F (37.1 C)] 98.4 F (36.9 C) (04/19 0812) Pulse Rate:  [70-86] 70 (04/19 0812) Resp:  [16-20] 18 (04/19 0812) BP: (94-111)/(50-73) 95/73 (04/19 0812) SpO2:  [92 %-100 %] 98 % (04/19 0812)  Weight change:  Filed Weights   11/07/22 1134 11/09/22 0801 11/09/22 1043  Weight: 90.3 kg 90.8 kg 90.1 kg    Intake/Output: I/O last 3 completed shifts: In: 120.1 [P.O.:120; IV Piggyback:0.1] Out: -    Intake/Output this shift:  No intake/output data recorded.  Physical Exam: General: NAD  Head: Normocephalic, atraumatic.  Moist oral mucosal membranes  Eyes: Anicteric  Lungs:  Clear to auscultation  Heart: Regular rate and rhythm  Abdomen:  Soft, nontender  Extremities: No peripheral edema.  Neurologic: Alert and oriented x 2  Skin: No lesions  Access: Left AVG (no bruit/thrill))    Basic Metabolic Panel: Recent Labs  Lab 11/06/22 0541 11/07/22 0610 11/08/22 0430 11/09/22 0618 11/10/22 1830  NA 140 142 141 139 135  K 3.2* 4.4 3.8 4.1 4.4  CL 104 103 101 101 98  CO2 GLUCOSE 158* 162* 152* 184* 177*  BUN 34* 49* 40* 49* 51*  CREATININE 6.97* 9.04* 7.53* 9.52* 9.28*  CALCIUM 8.7* 9.7 9.0 9.8 9.5  MG  --  1.8  --   --   --   PHOS  --  6.4*  --  6.3* 6.0*     Liver Function Tests: Recent Labs   Lab 11/10/22 1830  ALBUMIN 2.6*    No results for input(s): "LIPASE", "AMYLASE" in the last 168 hours. No results for input(s): "AMMONIA" in the last 168 hours.  CBC: Recent Labs  Lab 11/07/22 0610 11/08/22 0430 11/09/22 0618 11/10/22 0453 11/11/22 0500  WBC 6.6 7.1 7.6 7.1 8.3  NEUTROABS 3.5 3.5 4.3 3.7 4.6  HGB 10.8* 9.6* 10.2* 10.0* 9.1*  HCT 37.1 32.4* 34.5* 33.4* 30.5*  MCV 82.1 81.6 81.6 82.1 82.0  PLT 180 143* 148* 136* 129*     Cardiac Enzymes: No results for input(s): "CKTOTAL", "CKMB", "CKMBINDEX", "TROPONINI" in the last 168 hours.  BNP: Invalid input(s): "POCBNP"  CBG: No results for input(s): "GLUCAP" in the last 168 hours.   Microbiology: Results for orders placed or performed during the hospital encounter of 10/12/22  Blood culture (routine x 2)     Status: None   Collection Time: 10/12/22  5:40 PM   Specimen: BLOOD  Result Value Ref Range Status   Specimen Description BLOOD BLOOD RIGHT HAND [facility]  Final   Special Requests   Final    BOTTLES DRAWN AEROBIC AND ANAEROBIC Blood Culture results may not be optimal due to an inadequate volume of blood received in culture bottles   Culture   Final    NO GROWTH 5 DAYS Performed at [facility], [address]  [place], [place] [address]    Report Status 10/17/2022 FINAL  Final  Blood culture (routine x 2)     Status: None   Collection Time: 10/12/22  6:56 PM   Specimen: BLOOD  Result Value Ref Range Status   Specimen Description BLOOD BLOOD RIGHT FOREARM  Final   Special Requests   Final    BOTTLES DRAWN AEROBIC ONLY Blood Culture results may not be optimal due to an inadequate volume of blood received in culture bottles   Culture   Final    NO GROWTH 5 DAYS Performed at [facility], [address] [place], [place] [address]    Report Status 10/17/2022 FINAL  Final  Aerobic/Anaerobic Culture w Gram Stain (surgical/deep wound)     Status: None   Collection Time: 10/13/22 11:44  AM   Specimen: Abscess  Result Value Ref Range Status   Specimen Description   Final    ABSCESS Performed at [facility], [address], [place], [place] [address]    Special Requests   Final    MUSCLE ABSCESS Performed at [facility], [address], [place], [place] [address]    Gram Stain   Final    ABUNDANT WBC PRESENT, PREDOMINANTLY PMN NO ORGANISMS SEEN    Culture   Final    FEW STAPHYLOCOCCUS CAPITIS NO ANAEROBES ISOLATED Performed at [facility], [address], [place], [place] [address]    Report Status 10/18/2022 FINAL  Final   Organism ID, Bacteria STAPHYLOCOCCUS CAPITIS  Final      Susceptibility   Staphylococcus capitis - MIC*    CIPROFLOXACIN <=0.5 SENSITIVE Sensitive     ERYTHROMYCIN >=8 RESISTANT Resistant     GENTAMICIN <=0.5 SENSITIVE Sensitive     OXACILLIN <=0.25 SENSITIVE Sensitive     TETRACYCLINE <=1 SENSITIVE Sensitive     VANCOMYCIN 1 SENSITIVE Sensitive     TRIMETH/SULFA <=10 SENSITIVE Sensitive     CLINDAMYCIN INTERMEDIATE Intermediate     RIFAMPIN <=0.5 SENSITIVE Sensitive     Inducible Clindamycin NEGATIVE Sensitive     * FEW STAPHYLOCOCCUS CAPITIS    Coagulation Studies: No results for input(s): "LABPROT", "INR" in the last 72 hours.  Urinalysis: No results for input(s): "COLORURINE", "LABSPEC", "PHURINE", "GLUCOSEU", "HGBUR", "BILIRUBINUR", "KETONESUR", "PROTEINUR", "UROBILINOGEN", "NITRITE", "LEUKOCYTESUR" in the last 72 hours.  Invalid input(s): "APPERANCEUR"    Imaging: No results found.   Medications:    anticoagulant sodium citrate      ceFAZolin (ANCEF) IV     methocarbamol (ROBAXIN) IV Stopped (11/08/22 1455)    (feeding supplement) PROSource Plus  30 mL Oral BID BM   Chlorhexidine Gluconate Cloth  6 each Topical Q0600   Chlorhexidine Gluconate Cloth  6 each Topical Q0600   cinacalcet  30 mg Oral Q supper   feeding supplement (NEPRO CARB STEADY)  237 mL Oral TID BM   fentaNYL  1 patch  Transdermal Q72H   gabapentin  100 mg Oral Once per day on Mon Wed Fri   Gerhardt's butt cream   Topical QID   levETIRAcetam  1,000 mg Oral Daily   levETIRAcetam  250 mg Oral Q M,W,F   metoprolol succinate  100 mg Oral Daily   pantoprazole  20 mg Oral BID   psyllium  1 packet Oral Daily   QUEtiapine  50 mg Oral QHS   sertraline  25 mg Oral Daily   sodium chloride flush  10-40 mL Intracatheter Q12H   umeclidinium-vilanterol  1 puff Inhalation  Daily   alteplase, anticoagulant sodium citrate, fentaNYL (SUBLIMAZE) injection, hydrALAZINE, lidocaine (PF), lidocaine-prilocaine, LORazepam, methocarbamol (ROBAXIN) IV, ondansetron, senna-docusate, sodium chloride flush  Assessment/ Plan:  [name] is a 72 y.o.  female with end stage renal disease on hemodialysis, coronary artery disease, COPD with intermittent home O2, hypertension, hyperlipidemia, stroke, diabetes mellitus type II, and breast cancer who was admitted to [facility] on 10/12/2022 for Back pain [M54.9] Bilateral leg pain [M79.604, M79.605]  Patient was found to have vertebral osteomyelitis and psoas abscess. Cultures positive for staph capitis  [facility] MWF Left AVG 102.5kg  End-stage renal disease on hemodialysis. Resumed on MWF schedule.    Last dialysis treatment on Wednesday terminated after 2 hours due to access concerns. Vascular surgery attempted fisutagram and permcath placement yesterday, however patient very agitated and combative. Patient without functioning access for treatment today. Labs stable. HCPOA notified of events and discussing goals of care with family. Palliative will follow up today. Will continue to monitor labs.  *Addendum- vascular to place HD temp cath later today. Will dialyze patient tomorrow.   2. Anemia of chronic kidney disease Normocytic Lab Results  Component Value Date   HGB 9.1 (L) 11/11/2022  Patient receives Mircera at outpatient clinic.  Hemoglobin at goal.   3.  Secondary Hyperparathyroidism: with outpatient labs: PTH 180 (3/11), phosphorus 5.4, calcium 9.3   Lab Results  Component Value Date   PTH 131 (H) 03/14/2018   CALCIUM 9.5 11/10/2022   CAION 1.09 (L) 08/28/2022   PHOS 6.0 (H) 11/10/2022   -Continue Cinacalcet. -Will monitor bone minerals during admission   4.  Hypotension on hemodialysis treatment. Holding home regimen of losartan, metoprolol and torsemide. Blood pressure 110/55  5. Diabetes mellitus type II with chronic kidney disease/renal manifestations: insulin dependent.  Controlled with diet  6. Vertebral osteomyelitis, seen on CT lumber spine. Neurosurgery feels patient is not a surgical candidate CT guided aspiration of psoas abscess on 10/13/22. Cultures positive for Staph Capitis.   -Infectious disease recommending cefazolin with dialysis treatments. 8-week course in progress.  -Pharmacy notified if bedside administration due to malfunctioning HD access.      LOS: 30   4/19/20249:59 AM

## 2022-11-11 NOTE — Progress Notes (Signed)
Progress Note    11/11/2022 12:57 PM Day of Surgery  Subjective:  Cynthia Dean is a 72 y.o. 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 She presents to the ED with worsening lower extremity pain. Patient will be admitted for Back pain Bilateral leg pain [  Nephrology consults [place] again today for a temporary dialysis catheter placement for hemodialysis. Patient remains confused. Consent obtained through her daughter Kearie Mennen.    Vitals:   11/11/22 0526 11/11/22 0812  BP: (!) 110/55 95/73  Pulse: 71 70  Resp: 20 18  Temp: 98.8 F (37.1 C) 98.4 F (36.9 C)  SpO2: 95% 98%   Physical Exam: Cardiac:  RRR Lungs:  Clear on auscultation Incisions:  None Extremities:  Palpable pulses throughout. Abdomen:  Positive bowel sounds, soft, non-tender, non-distended Neurologic: Alert and oriented x 2   CBC    Component Value Date/Time   WBC 8.3 11/11/2022 0500   RBC 3.72 (L) 11/11/2022 0500   HGB 9.1 (L) 11/11/2022 0500   HGB 9.5 (L) 06/17/2014 0538   HCT 30.5 (L) 11/11/2022 0500   HCT 29.6 (L) 06/17/2014 0538   PLT 129 (L) 11/11/2022 0500   PLT 173 06/17/2014 0538   MCV 82.0 11/11/2022 0500   MCV 92 06/17/2014 0538   MCH 24.5 (L) 11/11/2022 0500   MCHC 29.8 (L) 11/11/2022 0500   RDW 21.0 (H) 11/11/2022 0500   RDW 16.2 (H) 06/17/2014 0538   LYMPHSABS 2.5 11/11/2022 0500   LYMPHSABS 2.5 06/17/2014 0538   MONOABS 0.9 11/11/2022 0500   MONOABS 1.0 (H) 06/17/2014 0538   EOSABS 0.3 11/11/2022 0500   EOSABS 0.2 06/17/2014 0538   BASOSABS 0.1 11/11/2022 0500   BASOSABS 0.1 06/17/2014 0538    BMET    Component Value Date/Time   NA 135 11/10/2022 1830   NA 135 (L) 06/16/2014 0915   K 4.4 11/10/2022 1830   K 4.8 06/16/2014 0915   CL 98 11/10/2022 1830   CL 100 06/16/2014 0915   CO2 25 11/10/2022 1830   CO2 22 06/16/2014 0915    GLUCOSE 177 (H) 11/10/2022 1830   GLUCOSE 226 (H) 06/16/2014 0915   BUN 51 (H) 11/10/2022 1830   BUN 47 (H) 06/16/2014 0915   CREATININE 9.28 (H) 11/10/2022 1830   CREATININE 7.60 (H) 06/16/2014 0915   CALCIUM 9.5 11/10/2022 1830   CALCIUM 7.2 (L) 06/16/2014 0915   GFRNONAA 4 (L) 11/10/2022 1830   GFRNONAA 6 (L) 06/16/2014 0915   GFRNONAA 6 (L) 04/07/2014 0918   GFRAA 5 (L) 01/30/2020 0500   GFRAA 7 (L) 06/16/2014 0915   GFRAA 7 (L) 04/07/2014 0918    INR    Component Value Date/Time   INR 1.7 (H) 03/24/2022 1656   INR 1.0 06/17/2014 0538     Intake/Output Summary (Last 24 hours) at 11/11/2022 1257 Last data filed at 11/11/2022 0955 Gross per 24 hour  Intake 120 ml  Output --  Net 120 ml     Assessment/Plan:  72 y.o. female  admitted for back pain and leg pain.  Day of Surgery   PLAN: Patient will be taken to the vascular lab for temporary dialysis catheter placement later today. Patient had eaten breakfast this morning so no sedation will be given. Patients daughter is aware of the procedure.   DVT prophylaxis:  NONE.   Marcie [facility] 11/11/2022 12:57  PM   

## 2022-11-12 DIAGNOSIS — K6812 Psoas muscle abscess: Secondary | ICD-10-CM | POA: Diagnosis not present

## 2022-11-12 DIAGNOSIS — T82868A Thrombosis of vascular prosthetic devices, implants and grafts, initial encounter: Secondary | ICD-10-CM | POA: Diagnosis not present

## 2022-11-12 DIAGNOSIS — R410 Disorientation, unspecified: Secondary | ICD-10-CM | POA: Diagnosis not present

## 2022-11-12 DIAGNOSIS — M79604 Pain in right leg: Secondary | ICD-10-CM | POA: Diagnosis not present

## 2022-11-12 DIAGNOSIS — M4646 Discitis, unspecified, lumbar region: Secondary | ICD-10-CM | POA: Diagnosis not present

## 2022-11-12 DIAGNOSIS — N186 End stage renal disease: Secondary | ICD-10-CM | POA: Diagnosis not present

## 2022-11-12 DIAGNOSIS — Z992 Dependence on renal dialysis: Secondary | ICD-10-CM | POA: Diagnosis not present

## 2022-11-12 LAB — CBC
HCT: 30.8 % — ABNORMAL LOW (ref 36.0–46.0)
Hemoglobin: 9 g/dL — ABNORMAL LOW (ref 12.0–15.0)
MCH: 24.1 pg — ABNORMAL LOW (ref 26.0–34.0)
MCHC: 29.2 g/dL — ABNORMAL LOW (ref 30.0–36.0)
MCV: 82.6 fL (ref 80.0–100.0)
Platelets: 156 10*3/uL (ref 150–400)
RBC: 3.73 MIL/uL — ABNORMAL LOW (ref 3.87–5.11)
RDW: 20.7 % — ABNORMAL HIGH (ref 11.5–15.5)
WBC: 7.5 10*3/uL (ref 4.0–10.5)
nRBC: 0 % (ref 0.0–0.2)

## 2022-11-12 LAB — RENAL FUNCTION PANEL
Albumin: 2.4 g/dL — ABNORMAL LOW (ref 3.5–5.0)
Anion gap: 14 (ref 5–15)
BUN: 65 mg/dL — ABNORMAL HIGH (ref 8–23)
CO2: 23 mmol/L (ref 22–32)
Calcium: 9.5 mg/dL (ref 8.9–10.3)
Chloride: 101 mmol/L (ref 98–111)
Creatinine, Ser: 11.16 mg/dL — ABNORMAL HIGH (ref 0.44–1.00)
GFR, Estimated: 3 mL/min — ABNORMAL LOW (ref >60)
Glucose, Bld: 199 mg/dL — ABNORMAL HIGH (ref 70–99)
Phosphorus: 7.1 mg/dL — ABNORMAL HIGH (ref 2.5–4.6)
Potassium: 3.9 mmol/L (ref 3.5–5.1)
Sodium: 138 mmol/L (ref 135–145)

## 2022-11-12 MED ORDER — HEPARIN SODIUM (PORCINE) 1000 UNIT/ML IJ SOLN
INTRAMUSCULAR | Status: AC
  Filled 2022-11-12: qty 10

## 2022-11-12 NOTE — Progress Notes (Signed)
[facility]  ROUNDING NOTE   Subjective:   Cynthia Dean is a 72 y.o. 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 She presents to the ED with worsening lower extremity pain. Patient will be admitted for Back pain [M54.9] Bilateral leg pain [M79.604, M79.605]  Patient seen and evaluated during dialysis   HEMODIALYSIS FLOWSHEET:  Blood Flow Rate (mL/min): 350 mL/min Arterial Pressure (mmHg): -220 mmHg Venous Pressure (mmHg): 150 mmHg TMP (mmHg): 8 mmHg Ultrafiltration Rate (mL/min): 387 mL/min Dialysate Flow Rate (mL/min): 300 ml/min Dialysis Fluid Bolus: Normal Saline Bolus Amount (mL): 100 mL (uf still off)  Currently resting quietly  Objective:  Vital signs in last 24 hours:  Temp:  [97.4 F (36.3 C)-98.3 F (36.8 C)] 97.4 F (36.3 C) (04/20 1240) Pulse Rate:  [67-81] 77 (04/20 1240) Resp:  [11-18] 13 (04/20 1240) BP: (86-127)/(54-77) 91/72 (04/20 1230) SpO2:  [91 %-100 %] 100 % (04/20 1240) Weight:  [90.8 kg] 90.8 kg (04/20 0919)  Weight change:  Filed Weights   11/09/22 0801 11/09/22 1043 11/12/22 0919  Weight: 90.8 kg 90.1 kg 90.8 kg    Intake/Output: I/O last 3 completed shifts: In: 360 [P.O.:360] Out: -    Intake/Output this shift:  No intake/output data recorded.  Physical Exam: General: NAD  Head: Normocephalic, atraumatic.  Moist oral mucosal membranes  Eyes: Anicteric  Lungs:  Clear to auscultation  Heart: Regular rate and rhythm  Abdomen:  Soft, nontender  Extremities: No peripheral edema.  Neurologic: Somnolent  Skin: No lesions  Access: Left AVG (no bruit/thrill)), right femoral temp cath placed on 11/11/2022    Basic Metabolic Panel: Recent Labs  Lab 11/07/22 0610 11/08/22 0430 11/09/22 0618 11/10/22 1830 11/12/22 0912  NA 142 141 139 135 138  K 4.4 3.8 4.1 4.4 3.9  CL 103 101 101  98 101  CO2 26 29 25 25 23   GLUCOSE 162* 152* 184* 177* 199*  BUN 49* 40* 49* 51* 65*  CREATININE 9.04* 7.53* 9.52* 9.28* 11.16*  CALCIUM 9.7 9.0 9.8 9.5 9.5  MG 1.8  --   --   --   --   PHOS 6.4*  --  6.3* 6.0* 7.1*     Liver Function Tests: Recent Labs  Lab 11/10/22 1830 11/12/22 0912  ALBUMIN 2.6* 2.4*    No results for input(s): "LIPASE", "AMYLASE" in the last 168 hours. No results for input(s): "AMMONIA" in the last 168 hours.  CBC: Recent Labs  Lab 11/07/22 0610 11/08/22 0430 11/09/22 0618 11/10/22 0453 11/11/22 0500 11/12/22 0912  WBC 6.6 7.1 7.6 7.1 8.3 7.5  NEUTROABS 3.5 3.5 4.3 3.7 4.6  --   HGB 10.8* 9.6* 10.2* 10.0* 9.1* 9.0*  HCT 37.1 32.4* 34.5* 33.4* 30.5* 30.8*  MCV 82.1 81.6 81.6 82.1 82.0 82.6  PLT 180 143* 148* 136* 129* 156     Cardiac Enzymes: No results for input(s): "CKTOTAL", "CKMB", "CKMBINDEX", "TROPONINI" in the last 168 hours.  BNP: Invalid input(s): "POCBNP"  CBG: No results for input(s): "GLUCAP" in the last 168 hours.   Microbiology: Results for orders placed or performed during the hospital encounter of 10/12/22  Blood culture (routine x 2)     Status: None   Collection Time: 10/12/22  5:40 PM   Specimen: BLOOD  Result Value Ref Range Status   Specimen Description BLOOD BLOOD RIGHT HAND [facility]  Final   Special Requests  Final    BOTTLES DRAWN AEROBIC AND ANAEROBIC Blood Culture results may not be optimal due to an inadequate volume of blood received in culture bottles   Culture   Final    NO GROWTH 5 DAYS Performed at [facility], [address] [place], [place] [address]    Report Status 10/17/2022 FINAL  Final  Blood culture (routine x 2)     Status: None   Collection Time: 10/12/22  6:56 PM   Specimen: BLOOD  Result Value Ref Range Status   Specimen Description BLOOD BLOOD RIGHT FOREARM  Final   Special Requests   Final    BOTTLES DRAWN AEROBIC ONLY Blood Culture results may not be optimal due to an  inadequate volume of blood received in culture bottles   Culture   Final    NO GROWTH 5 DAYS Performed at [facility], [address] [place], [place] [address]    Report Status 10/17/2022 FINAL  Final  Aerobic/Anaerobic Culture w Gram Stain (surgical/deep wound)     Status: None   Collection Time: 10/13/22 11:44 AM   Specimen: Abscess  Result Value Ref Range Status   Specimen Description   Final    ABSCESS Performed at [facility], [address] [place], [place] [address]    Special Requests   Final    MUSCLE ABSCESS Performed at [facility], [address] [place], [place] [address]    Gram Stain   Final    ABUNDANT WBC PRESENT, PREDOMINANTLY PMN NO ORGANISMS SEEN    Culture   Final    FEW STAPHYLOCOCCUS CAPITIS NO ANAEROBES ISOLATED Performed at [facility], [address] [place], [place] [address]    Report Status 10/18/2022 FINAL  Final   Organism ID, Bacteria STAPHYLOCOCCUS CAPITIS  Final      Susceptibility   Staphylococcus capitis - MIC*    CIPROFLOXACIN <=0.5 SENSITIVE Sensitive     ERYTHROMYCIN >=8 RESISTANT Resistant     GENTAMICIN <=0.5 SENSITIVE Sensitive     OXACILLIN <=0.25 SENSITIVE Sensitive     TETRACYCLINE <=1 SENSITIVE Sensitive     VANCOMYCIN 1 SENSITIVE Sensitive     TRIMETH/SULFA <=10 SENSITIVE Sensitive     CLINDAMYCIN INTERMEDIATE Intermediate     RIFAMPIN <=0.5 SENSITIVE Sensitive     Inducible Clindamycin NEGATIVE Sensitive     * FEW STAPHYLOCOCCUS CAPITIS    Coagulation Studies: No results for input(s): "LABPROT", "INR" in the last 72 hours.  Urinalysis: No results for input(s): "COLORURINE", "LABSPEC", "PHURINE", "GLUCOSEU", "HGBUR", "BILIRUBINUR", "KETONESUR", "PROTEINUR", "UROBILINOGEN", "NITRITE", "LEUKOCYTESUR" in the last 72 hours.  Invalid input(s): "APPERANCEUR"    Imaging: PERIPHERAL VASCULAR CATHETERIZATION  Result Date: 11/11/2022 See surgical note for result.     Medications:    anticoagulant sodium citrate      ceFAZolin (ANCEF) IV 1 g (11/11/22 2127)   methocarbamol (ROBAXIN) IV Stopped (11/08/22 1455)    (feeding supplement) PROSource Plus  30 mL Oral BID BM   Chlorhexidine Gluconate Cloth  6 each Topical Q0600   cinacalcet  30 mg Oral Q supper   feeding supplement (NEPRO CARB STEADY)  237 mL Oral TID BM   [START ON 11/13/2022] fentaNYL  1 patch Transdermal Q72H   gabapentin  100 mg Oral Once per day on Mon Wed Fri   Gerhardt's butt cream   Topical QID   levETIRAcetam  1,000 mg Oral Daily   levETIRAcetam  250 mg Oral Q M,W,F   metoprolol succinate  100 mg Oral Daily   pantoprazole  20 mg Oral BID   psyllium  1 packet Oral Daily   QUEtiapine  25 mg Oral QHS   sertraline  25 mg Oral Daily   sodium chloride flush  10-40 mL Intracatheter Q12H   umeclidinium-vilanterol  1 puff Inhalation Daily   alteplase, anticoagulant sodium citrate, fentaNYL (SUBLIMAZE) injection, hydrALAZINE, lidocaine (PF), lidocaine-prilocaine, LORazepam, methocarbamol (ROBAXIN) IV, ondansetron, senna-docusate, sodium chloride flush  Assessment/ Plan:  Cynthia Dean is a 72 y.o.  female with end stage renal disease on hemodialysis, coronary artery disease, COPD with intermittent home O2, hypertension, hyperlipidemia, stroke, diabetes mellitus type II, and breast cancer who was admitted to [facility] on 10/12/2022 for Back pain [M54.9] Bilateral leg pain [M79.604, M79.605]  Patient was found to have vertebral osteomyelitis and psoas abscess. Cultures positive for staph capitis  [facility] MWF Left AVG 102.5kg  End-stage renal disease on hemodialysis. Resumed on MWF schedule.    Appreciate vascular placing right femoral temp cath on 11/11/2022.  Patient receiving dialysis today, UF goal originally 1 L however patient experienced hypotension.  UF goal adjusted to 0.5 L as tolerated.  Next treatment scheduled for Monday.  Patient will need to  follow-up with vascular next week for fistulogram and possible PermCath placement.  2. Anemia of chronic kidney disease Normocytic Lab Results  Component Value Date   HGB 9.0 (L) 11/12/2022  Patient receives Mircera at outpatient clinic.  Hemoglobin stable  3. Secondary Hyperparathyroidism: with outpatient labs: PTH 180 (3/11), phosphorus 5.4, calcium 9.3   Lab Results  Component Value Date   PTH 131 (H) 03/14/2018   CALCIUM 9.5 11/12/2022   CAION 1.09 (L) 08/28/2022   PHOS 7.1 (H) 11/12/2022   -Continue Cinacalcet. -Phosphorus remains slightly elevated.  4.  Hypotension on hemodialysis treatment. Holding home regimen of losartan, metoprolol and torsemide. Blood pressure 116/58 during dialysis  5. Diabetes mellitus type II with chronic kidney disease/renal manifestations: insulin dependent.  Controlled with diet  6. Vertebral osteomyelitis, seen on CT lumber spine. Neurosurgery feels patient is not a surgical candidate CT guided aspiration of psoas abscess on 10/13/22. Cultures positive for Staph Capitis.   -Infectious disease recommending cefazolin with dialysis treatments. 8-week course in progress.  -Pharmacy notified if bedside administration due to malfunctioning HD access.      LOS: 31   4/20/202412:46 PM

## 2022-11-12 NOTE — Progress Notes (Signed)
  Received patient in bed to unit.   Informed consent signed and in chart.    TX duration:3 hrs     Transported back to floor  Hand-off given to patient's nurse. No c/o and no distress noted    Access used: R femoral temp cath  Access issues: none   Total UF removed: 0.5kg Medication(s) given: none Post HD VS: 127/64 Post HD weight: 90.3kg     Lynann Beaver  [facility]

## 2022-11-12 NOTE — Progress Notes (Signed)
Pt transferred via hospital bed in the supine position by the orderly to the hemodialysis unit for hemodialysis

## 2022-11-12 NOTE — Progress Notes (Signed)
Pt transferred via bed in the supine position from Hemodialysis to Room 123 by the orderly

## 2022-11-12 NOTE — Progress Notes (Signed)
Progress Note   Patient: Alden Feagan ZOX:[id number] DOB: 1951/04/13 DOA: 10/12/2022     31 DOS: the patient was seen and examined on 11/12/2022    Brief hospital course:   Angelica Sui Kasparek is a 72 y/o F w/ PMH of ESRD on HD, COPD, DM2, HTN, depression, CVA who presented w/ back pain x 3 weeks.  Pt presented w/ back pain and was found to intradiscal abscess/lumbar discitis/b/l psoas abscess on MRI status post IR guided drainage. Patient has had waxing and waning mental status with intermittently refusing dialysis. Patient's family have decided to continue hemodialysis after speaking with their PCP.   Hospital course prolonged, complicated by patient's inability to tolerate sitting in recliner chair for dialysis.  She only agrees to dialysis in the bed.  This is a barrier to discharge as no outpatient dialysis centers can accommodate.          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  Wound cx growing staph capitis.  ID consulted.  Abx de-escalated to cefazolin.  Not a good surgical candidate as per neuro surg.   Pt having severe low back and bilateral leg pain --cont cefazolin, for 6 weeks, End Date: 12/02/22 --pain management per orders -- palliative care following --transitioned from fentanyl patch + IV PRN >>> scheduled + PRN Norco on 4/17, since staff have not been using PRN fentanyl doses apparently for concern of side effects and/or hypotension.  Pain has remained uncontrolled, patient cannot tolerate any position except laying nearly flat in the bed     Confusion and agitation Hospital delirium --Pt was agitated, and also not responding to questions or commands during early hospitalization.  Mental status did start to improve, and now pt is calm and interactive,  however, at this time she has been having waxing and waning mental status --Delirium precautions --cont seroquel 50 mg nightly (new) to help with delirium and sleep.     ESRD: on HD MWF.  Pt refused HD on 10/17/22, 10/18/22 and 3/27, 4/1.   Since then, has been agreeable to dialysis without further issues, getting dialysis in hospital bed. When attempts made to do HD in chair, she did not tolerate on account of severe back pain --Recommend consideration for home hemodialysis if no facility capable to do HD in the bed 4/18 --HD access / fistula clotted off during dialysis 4/19 --pt taken for procedure to address fistula malfunction, but was agitated and combative, it was cancelled 4/20 --due to dialysis, no access at this time.  Vascular plans to attempt temp HD cath placement today if pt is amenable.  Afternoon - R femoral temp HD cath placed 4/21 --dialyzed in bed    Goals of Care  4/10 per Dr. Meriam Sprague: I understand goals of care were discussed on 11/01/2022 with patient as well as patient's family at bedside and they had understanding that there was no other option for hemodialysis without being able to sit in the recliner and so they went with the option of hospice.  Patient's family had discussion with patient's PCP who made him aware that since her low back pain is due to acute findings of discitis of L4-L5 vertebra and have not had a chance of adequate treatment of this acute discitis to determine whether her back pain will improve  to give her a chance of continuation of hemodialysis, they believe decision for hospice was premature.  I personally spoke with the family as well as patient's PCP.  We are looking at options of getting patient accepted to a hemodialysis capable facility since patient is unable to go to any facility outside those with dialysis capability.  I spoke with patient today and both patient and the family expressed interest to continue hemodialysis.     Generalized weakness /  Physical Debility:  PT recs SNF. TOC following. Pt is bedridden and very high risk for developing pressure injuries of skin -- reposition frequently.   Monitor pressures areas on skin and offload as possible.   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 H&H are stable    Hypokalemia:  4/8: K 3.6 --monitor and replete PRN    Peripheral neuropathy --  4/4 - Cautious trial of gabapentin 100 mg daily (ESRD - monitor closely for side effects) 4% - Pt notes significant improvement and no side effects so far --Continue low dose gabapentin and monitor   Hx of seizures:  continue on home dose of Keppra   HLD:  continue on statin    Hx of COPD: w/o exacerbation.  Continue on bronchodilators    DM2: well controlled HbA1c 5.2.  --d/c'ed BG checks    HTN:  --Toprol XL resumed --Continue holding losartan    GERD:  continue on PPI    Depression: severity unknown.  Continue on home dose of sertraline     Overweight: BMI 28.2.  Wt loss efforts recommended   Right foot pain - see peripheral neuropathy     DVT prophylaxis: Heparin SQ Code Status: Full code    Subjective / Interval history:  Patient reports feeling well today. Denies uncontrolled pain, laying in the bed. Dialysis today in the bed, still has not tolerated being in chair and declines attempts with therapy when they see her.     Family Communication: None present on rounds, will attempt to call.   Nephrology and Palliative Care also in communications with daughter.     Consultants - Nephrology, Infectious Disease, Palliative Care   Level of care: Telemetry Medical   Dispo:   The patient is from: home Anticipated d/c is to: SNF  Anticipated d/c date is: TBD pending GOC decisions        Physical Exam:   General exam: awake alert, no acute distress Respiratory system: on room air, normal respiratory effort. Cardiovascular system: RRR, no peripheral LE edema Gastrointestinal system: soft, NT, ND  Central  nervous system: alert, oriented to self and place at least. no gross focal neurologic deficits Extremities:  LUE fistula noted, no edema, normal tone Skin: dry, intact, normal temperature Psychiatry: normal mood, congruent affect    Vitals:   11/12/22 0946 11/12/22 1000 11/12/22 1030 11/12/22 1100  BP: 113/77 118/68 (!) 108/58 98/60  Pulse:  76 76 74  Resp:  Temp:      TempSrc:      SpO2:  98% 99% 100%  Weight:      Height:          Time spent: 25 minutes  Author: Pennie Banter, DO 11/12/2022 11:36 AM  For on call review [email or web address].

## 2022-11-13 DIAGNOSIS — M79604 Pain in right leg: Secondary | ICD-10-CM | POA: Diagnosis not present

## 2022-11-13 DIAGNOSIS — K6812 Psoas muscle abscess: Secondary | ICD-10-CM | POA: Diagnosis not present

## 2022-11-13 DIAGNOSIS — M4646 Discitis, unspecified, lumbar region: Secondary | ICD-10-CM | POA: Diagnosis not present

## 2022-11-13 DIAGNOSIS — N186 End stage renal disease: Secondary | ICD-10-CM | POA: Diagnosis not present

## 2022-11-13 MED ORDER — CEFAZOLIN SODIUM-DEXTROSE 2-4 GM/100ML-% IV SOLN
2.0000 g | INTRAVENOUS | Status: AC
  Administered 2022-11-14 – 2022-11-28 (×3): 2 g via INTRAVENOUS
  Filled 2022-11-13 (×4): qty 100

## 2022-11-13 MED ORDER — CEFAZOLIN IN SODIUM CHLORIDE 3-0.9 GM/100ML-% IV SOLN
3.0000 g | INTRAVENOUS | Status: AC
  Administered 2022-11-18 – 2022-12-02 (×3): 3 g via INTRAVENOUS
  Filled 2022-11-13 (×3): qty 100

## 2022-11-13 MED ORDER — CEFAZOLIN SODIUM-DEXTROSE 2-4 GM/100ML-% IV SOLN
2.0000 g | INTRAVENOUS | Status: AC
  Administered 2022-11-16 – 2022-11-30 (×3): 2 g via INTRAVENOUS
  Filled 2022-11-13 (×3): qty 100

## 2022-11-13 NOTE — Progress Notes (Signed)
Progress Note   [name] DOB: 1951-03-16 DOA: 10/12/2022     32 DOS: the patient was seen and examined on 11/13/2022    Brief hospital course:   Cynthia Dean is a 72 y/o F w/ PMH of ESRD on HD, COPD, DM2, HTN, depression, CVA who presented w/ back pain x 3 weeks.  Pt presented w/ back pain and was found to intradiscal abscess/lumbar discitis/b/l psoas abscess on MRI status post IR guided drainage. Patient has had waxing and waning mental status with intermittently refusing dialysis. Patient's family have decided to continue hemodialysis after speaking with their PCP.   Hospital course prolonged, complicated by patient's inability to tolerate sitting in recliner chair for dialysis.  She only agrees to dialysis in the bed.  This is a barrier to discharge as no outpatient dialysis centers can accommodate.          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  Wound cx growing staph capitis.  ID consulted.  Abx de-escalated to cefazolin.  Not a good surgical candidate as per neuro surg.   Pt having severe low back and bilateral leg pain --cont cefazolin, for 6 weeks, End Date: 12/02/22 --pain management per orders -- palliative care following --transitioned from fentanyl patch + IV PRN >>> scheduled + PRN Norco on 4/17, since staff have not been using PRN fentanyl doses apparently for concern of side effects and/or hypotension.  Pain has remained uncontrolled, patient cannot tolerate any position except laying nearly flat in the bed     Confusion and agitation Hospital delirium --Pt was agitated, and also not responding to questions or commands during early hospitalization.  Mental status did start to improve, and now pt is calm and interactive,  however, at this time she has been having waxing and waning mental status --Delirium precautions --cont seroquel 50 mg nightly (new) to help with delirium and sleep.     ESRD: on HD MWF.  Pt refused HD on 10/17/22, 10/18/22 and 3/27, 4/1.   Since then, has been agreeable to dialysis without further issues, getting dialysis in hospital bed. When attempts made to do HD in chair, she did not tolerate on account of severe back pain --Recommend consideration for home hemodialysis if no facility capable to do HD in the bed 4/18 --HD access / fistula clotted off during dialysis 4/19 --pt taken for procedure to address fistula malfunction, but was agitated and combative, it was cancelled 4/20 --due to dialysis, no access at this time.  Vascular plans to attempt temp HD cath placement today if pt is amenable.  Afternoon - R femoral temp HD cath placed 4/21 --dialyzed in bed    Goals of Care  4/10 per Dr. Meriam Sprague: I understand goals of care were discussed on 11/01/2022 with patient as well as patient's family at bedside and they had understanding that there was no other option for hemodialysis without being able to sit in the recliner and so they went with the option of hospice.  Patient's family had discussion with patient's PCP who made him aware that since her low back pain is due to acute findings of discitis of L4-L5 vertebra and have not had a chance of adequate treatment of this acute discitis to determine whether her back pain will improve  to give her a chance of continuation of hemodialysis, they believe decision for hospice was premature.  I personally spoke with the family as well as patient's PCP.  We are looking at options of getting patient accepted to a hemodialysis capable facility since patient is unable to go to any facility outside those with dialysis capability.  I spoke with patient today and both patient and the family expressed interest to continue hemodialysis.     Generalized weakness /  Physical Debility:  PT recs SNF. TOC following. Pt is bedridden and very high risk for developing pressure injuries of skin -- reposition frequently.   Monitor pressures areas on skin and offload as possible.   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 H&H are stable    Hypokalemia:  4/8: K 3.6 --monitor and replete PRN    Peripheral neuropathy --  4/4 - Cautious trial of gabapentin 100 mg daily (ESRD - monitor closely for side effects) 4% - Pt notes significant improvement and no side effects so far --Continue low dose gabapentin and monitor   Hx of seizures:  continue on home dose of Keppra   HLD:  continue on statin    Hx of COPD: w/o exacerbation.  Continue on bronchodilators    DM2: well controlled HbA1c 5.2.  --d/c'ed BG checks    HTN:  --Toprol XL resumed --Continue holding losartan    GERD:  continue on PPI    Depression: severity unknown.  Continue on home dose of sertraline     Overweight: BMI 28.2.  Wt loss efforts recommended   Right foot pain - see peripheral neuropathy     DVT prophylaxis: Heparin SQ Code Status: Full code    Subjective / Interval history:  Patient was sleeping but woke to voice this AM, laying flat in bed.  She reports pain currently controlled.  Does not recall how she did with dialysis yesterday.  No current acute complaints.      Family Communication: None present on rounds, will attempt to call.   Nephrology and Palliative Care also in communications with daughter.     Consultants - Nephrology, Infectious Disease, Palliative Care   Level of care: Telemetry Medical   Dispo:   The patient is from: home Anticipated d/c is to: SNF  Anticipated d/c date is: TBD pending GOC decisions        Physical Exam:   General exam: awake alert, no acute distress Respiratory system: on room air, normal respiratory effort. Cardiovascular system: RRR, no peripheral LE edema Gastrointestinal system: soft, NT, ND  Central nervous system:  alert, oriented to self and place at least. no gross focal neurologic deficits Extremities:  LUE fistula noted, no edema, normal tone Skin: dry, intact, normal temperature Psychiatry: depressed mood, congruent affect    Vitals:   11/12/22 1559 11/12/22 2154 11/13/22 0430 11/13/22 0740  BP: 113/62 (!) 115/56 116/61 122/63  Pulse: 76 81 75 74  Resp: Temp: 98.5 F (36.9 C) 98.3 F (36.8 C) 98.2 F (36.8 C) 98.5 F (36.9 C)  TempSrc: Oral     SpO2: 100% 92% 95% 96%  Weight:      Height:          Time spent: 25 minutes  Author: Pennie Banter, DO 11/13/2022 12:21 PM  For on call review [email or web address].

## 2022-11-13 NOTE — Progress Notes (Signed)
[facility]  ROUNDING NOTE   Subjective:   Cynthia Dean is a 72 y.o. 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 She presents to the ED with worsening lower extremity pain. Patient will be admitted for Back pain [M54.9] Bilateral leg pain [M79.604, M79.605]  Patient seen laying in bed No family at bedside Alert, appropriate conversation Remains on room air No lower extremity edema  Objective:  Vital signs in last 24 hours:  Temp:  [97.4 F (36.3 C)-98.5 F (36.9 C)] 98.5 F (36.9 C) (04/21 0740) Pulse Rate:  [74-81] 74 (04/21 0740) Resp:  [11-18] 18 (04/21 0740) BP: (91-127)/(56-76) 122/63 (04/21 0740) SpO2:  [92 %-100 %] 96 % (04/21 0740) Weight:  [90.3 kg] 90.3 kg (04/20 1323)  Weight change:  Filed Weights   11/09/22 1043 11/12/22 0919 11/12/22 1323  Weight: 90.1 kg 90.8 kg 90.3 kg    Intake/Output: I/O last 3 completed shifts: In: -  Out: 500 [Other:500]   Intake/Output this shift:  No intake/output data recorded.  Physical Exam: General: NAD  Head: Normocephalic, atraumatic.  Moist oral mucosal membranes  Eyes: Anicteric  Lungs:  Clear to auscultation  Heart: Regular rate and rhythm  Abdomen:  Soft, nontender  Extremities: No peripheral edema.  Neurologic: Alert  Skin: No lesions  Access: Left AVG (no bruit/thrill)), right femoral temp cath placed on 11/11/2022    Basic Metabolic Panel: Recent Labs  Lab 11/07/22 0610 11/08/22 0430 11/09/22 0618 11/10/22 1830 11/12/22 0912  NA 142 141 139 135 138  K 4.4 3.8 4.1 4.4 3.9  CL 103 101 101 98 101  CO2 26 29 25 25 23   GLUCOSE 162* 152* 184* 177* 199*  BUN 49* 40* 49* 51* 65*  CREATININE 9.04* 7.53* 9.52* 9.28* 11.16*  CALCIUM 9.7 9.0 9.8 9.5 9.5  MG 1.8  --   --   --   --   PHOS 6.4*  --  6.3* 6.0* 7.1*     Liver Function Tests: Recent Labs   Lab 11/10/22 1830 11/12/22 0912  ALBUMIN 2.6* 2.4*    No results for input(s): "LIPASE", "AMYLASE" in the last 168 hours. No results for input(s): "AMMONIA" in the last 168 hours.  CBC: Recent Labs  Lab 11/07/22 0610 11/08/22 0430 11/09/22 0618 11/10/22 0453 11/11/22 0500 11/12/22 0912  WBC 6.6 7.1 7.6 7.1 8.3 7.5  NEUTROABS 3.5 3.5 4.3 3.7 4.6  --   HGB 10.8* 9.6* 10.2* 10.0* 9.1* 9.0*  HCT 37.1 32.4* 34.5* 33.4* 30.5* 30.8*  MCV 82.1 81.6 81.6 82.1 82.0 82.6  PLT 180 143* 148* 136* 129* 156     Cardiac Enzymes: No results for input(s): "CKTOTAL", "CKMB", "CKMBINDEX", "TROPONINI" in the last 168 hours.  BNP: Invalid input(s): "POCBNP"  CBG: No results for input(s): "GLUCAP" in the last 168 hours.   Microbiology: Results for orders placed or performed during the hospital encounter of 10/12/22  Blood culture (routine x 2)     Status: None   Collection Time: 10/12/22  5:40 PM   Specimen: BLOOD  Result Value Ref Range Status   Specimen Description BLOOD BLOOD RIGHT HAND [facility]  Final   Special Requests   Final    BOTTLES DRAWN AEROBIC AND ANAEROBIC Blood Culture results may not be optimal due to an inadequate volume of blood received in culture bottles   Culture   Final    NO GROWTH 5  DAYS Performed at [facility], [address] [place], [place] [address]    Report Status 10/17/2022 FINAL  Final  Blood culture (routine x 2)     Status: None   Collection Time: 10/12/22  6:56 PM   Specimen: BLOOD  Result Value Ref Range Status   Specimen Description BLOOD BLOOD RIGHT FOREARM  Final   Special Requests   Final    BOTTLES DRAWN AEROBIC ONLY Blood Culture results may not be optimal due to an inadequate volume of blood received in culture bottles   Culture   Final    NO GROWTH 5 DAYS Performed at [facility], [address], [place], [place] [address]    Report Status 10/17/2022 FINAL  Final  Aerobic/Anaerobic Culture w Gram Stain  (surgical/deep wound)     Status: None   Collection Time: 10/13/22 11:44 AM   Specimen: Abscess  Result Value Ref Range Status   Specimen Description   Final    ABSCESS Performed at [facility], [address], [place], [place] [address]    Special Requests   Final    MUSCLE ABSCESS Performed at [facility], [address] [place], [place] [address]    Gram Stain   Final    ABUNDANT WBC PRESENT, PREDOMINANTLY PMN NO ORGANISMS SEEN    Culture   Final    FEW STAPHYLOCOCCUS CAPITIS NO ANAEROBES ISOLATED Performed at [facility], [address] [place], [place] [address]    Report Status 10/18/2022 FINAL  Final   Organism ID, Bacteria STAPHYLOCOCCUS CAPITIS  Final      Susceptibility   Staphylococcus capitis - MIC*    CIPROFLOXACIN <=0.5 SENSITIVE Sensitive     ERYTHROMYCIN >=8 RESISTANT Resistant     GENTAMICIN <=0.5 SENSITIVE Sensitive     OXACILLIN <=0.25 SENSITIVE Sensitive     TETRACYCLINE <=1 SENSITIVE Sensitive     VANCOMYCIN 1 SENSITIVE Sensitive     TRIMETH/SULFA <=10 SENSITIVE Sensitive     CLINDAMYCIN INTERMEDIATE Intermediate     RIFAMPIN <=0.5 SENSITIVE Sensitive     Inducible Clindamycin NEGATIVE Sensitive     * FEW STAPHYLOCOCCUS CAPITIS    Coagulation Studies: No results for input(s): "LABPROT", "INR" in the last 72 hours.  Urinalysis: No results for input(s): "COLORURINE", "LABSPEC", "PHURINE", "GLUCOSEU", "HGBUR", "BILIRUBINUR", "KETONESUR", "PROTEINUR", "UROBILINOGEN", "NITRITE", "LEUKOCYTESUR" in the last 72 hours.  Invalid input(s): "APPERANCEUR"    Imaging: PERIPHERAL VASCULAR CATHETERIZATION  Result Date: 11/11/2022 See surgical note for result.    Medications:    anticoagulant sodium citrate      ceFAZolin (ANCEF) IV 1 g (11/12/22 2148)   methocarbamol (ROBAXIN) IV Stopped (11/08/22 1455)    (feeding supplement) PROSource Plus  30 mL Oral BID BM   Chlorhexidine Gluconate Cloth  6 each Topical Q0600    cinacalcet  30 mg Oral Q supper   feeding supplement (NEPRO CARB STEADY)  237 mL Oral TID BM   fentaNYL  1 patch Transdermal Q72H   gabapentin  100 mg Oral Once per day on Mon Wed Fri   Gerhardt's butt cream   Topical QID   levETIRAcetam  1,000 mg Oral Daily   levETIRAcetam  250 mg Oral Q M,W,F   metoprolol succinate  100 mg Oral Daily   pantoprazole  20 mg Oral BID   psyllium  1 packet Oral Daily   QUEtiapine  25 mg Oral QHS   sertraline  25 mg Oral Daily   sodium chloride flush  10-40 mL Intracatheter Q12H   umeclidinium-vilanterol  1 puff Inhalation Daily   alteplase, anticoagulant sodium citrate, fentaNYL (SUBLIMAZE) injection, hydrALAZINE, lidocaine (PF), lidocaine-prilocaine, LORazepam, methocarbamol (ROBAXIN) IV, ondansetron, senna-docusate, sodium chloride flush  Assessment/ Plan:  [name] is a 72 y.o.  female with end stage renal disease on hemodialysis, coronary artery disease, COPD with intermittent home O2, hypertension, hyperlipidemia, stroke, diabetes mellitus type II, and breast cancer who was admitted to [facility] on 10/12/2022 for Back pain [M54.9] Bilateral leg pain [M79.604, M79.605]  Patient was found to have vertebral osteomyelitis and psoas abscess. Cultures positive for staph capitis  [facility] MWF Left AVG 102.5kg  End-stage renal disease on hemodialysis. Resumed on MWF schedule.    Next treatment scheduled for Monday.  Patient will need to follow-up with vascular next week for fistulogram and possible PermCath placement.  2. Anemia of chronic kidney disease Normocytic Lab Results  Component Value Date   HGB 9.0 (L) 11/12/2022  Patient receives Mircera at outpatient clinic.  Hemoglobin at goal  3. Secondary Hyperparathyroidism: with outpatient labs: PTH 180 (3/11), phosphorus 5.4, calcium 9.3   Lab Results  Component Value Date   PTH 131 (H) 03/14/2018   CALCIUM 9.5 11/12/2022   CAION 1.09 (L) 08/28/2022   PHOS 7.1 (H)  11/12/2022   -Continue Cinacalcet. -Will continue to monitor bone minerals during this admission.   4.  Hypotension on hemodialysis treatment. Holding home regimen of losartan, metoprolol and torsemide. Blood pressure stable for this patient  5. Diabetes mellitus type II with chronic kidney disease/renal manifestations: insulin dependent, NPH.  Managed by primary team  6. Vertebral osteomyelitis, seen on CT lumber spine. Neurosurgery feels patient is not a surgical candidate CT guided aspiration of psoas abscess on 10/13/22. Cultures positive for Staph Capitis.   -Infectious disease recommending cefazolin with dialysis treatments. 8-week course in progress.  -Will notify pharmacy of continued dialysis.      LOS: 32   4/21/202410:16 AM

## 2022-11-13 NOTE — Consult Note (Signed)
Pharmacy Antibiotic Note  Cynthia Dean is a 72 y.o. female admitted on 10/12/2022 with discitis and bilateral psoas abscess. Patient receives hemodialysis MWF outpatient. Plan is to continue regimen while inpatient. IR aspiration of abscess on 3/21 with culture growing Methicillin-susceptible S. capitis. Patient was on cefazolin 2g/2g/3g QMWF with HD, but had issues receiving routine HD week of 4/15 and was switched to cefazolin 1g Q24H. Per discussion with nephrology, patient to return to QMWF HD on 4/22. Pharmacy has been consulted for cefazolin dosing.    Plan Day 32 of antibiotics Discontinue cefazolin 1g Q24H after tonight's dose Resume cefazolin 2g/2g/3d QHD starting 4/22 as patient will continue HD per nephrology OPAT completed for 2g/2g/3g Continue to monitor plans per nephrology and follow culture results  Height: 6' (182.9 cm) Weight: 90.3 kg (199 lb 1.2 oz) IBW/kg (Calculated) : 73.1  Temp (24hrs), Avg:98.1 F (36.7 C), Min:97.4 F (36.3 C), Max:98.5 F (36.9 C)  Recent Labs  Lab 11/07/22 0610 11/08/22 0430 11/09/22 0618 11/10/22 0453 11/10/22 1830 11/11/22 0500 11/12/22 0912  WBC 6.6 7.1 7.6 7.1  --  8.3 7.5  CREATININE 9.04* 7.53* 9.52*  --  9.28*  --  11.16*     Estimated Creatinine Clearance: 5.8 mL/min (A) (by C-G formula based on SCr of 11.16 mg/dL (H)).    Allergies  Allergen Reactions   Oxycodone Nausea And Vomiting   Oxycodone-Acetaminophen Other (See Comments)   Wound Dressing Adhesive    Hydrocodone     Intolerant more than allergic   Tape Itching    Skin Dermatitis/itching (tape adhesive) Skin Dermatitis/itching (tape adhesive)   Tapentadol Itching    Skin Dermatitis/itching (tape adhesive) Skin Dermatitis/itching (tape adhesive)     Antimicrobials this admission: 3/21Vancomycin >> 3/26 3/21 Cefepime >> 3/25 3/26 Cefazolin  >>  Dose adjustments this admission: ESRD  Microbiology results: 3/20 BCx: NG final 3/21 Abscess cx:  Methicillin-susceptible Staph capitis   Thank you for allowing pharmacy to be a part of this patient's care.  Celene Squibb, PharmD PGY1 Pharmacy Resident 11/13/2022 10:32 AM

## 2022-11-14 ENCOUNTER — Encounter: Payer: Self-pay | Admitting: Vascular Surgery

## 2022-11-14 DIAGNOSIS — M79604 Pain in right leg: Secondary | ICD-10-CM | POA: Diagnosis not present

## 2022-11-14 DIAGNOSIS — M4646 Discitis, unspecified, lumbar region: Secondary | ICD-10-CM | POA: Diagnosis not present

## 2022-11-14 DIAGNOSIS — N186 End stage renal disease: Secondary | ICD-10-CM | POA: Diagnosis not present

## 2022-11-14 DIAGNOSIS — K6812 Psoas muscle abscess: Secondary | ICD-10-CM | POA: Diagnosis not present

## 2022-11-14 MED ORDER — EPOETIN ALFA 4000 UNIT/ML IJ SOLN
4000.0000 [IU] | INTRAMUSCULAR | Status: DC
  Administered 2022-11-14 – 2022-12-05 (×9): 4000 [IU] via INTRAVENOUS
  Filled 2022-11-14 (×9): qty 1

## 2022-11-14 MED ORDER — EPOETIN ALFA 4000 UNIT/ML IJ SOLN
INTRAMUSCULAR | Status: AC
  Filled 2022-11-14: qty 1

## 2022-11-14 MED ORDER — HEPARIN SODIUM (PORCINE) 1000 UNIT/ML IJ SOLN
INTRAMUSCULAR | Status: AC
  Filled 2022-11-14: qty 10

## 2022-11-14 NOTE — Progress Notes (Signed)
Progress Note   [name] ZOX:[id number] DOB: [birth year] DOA: 10/12/2022     33 DOS: the patient was seen and examined on 11/14/2022    Brief hospital course:   Cynthia Dean is a 72 y/o F w/ PMH of ESRD on HD, COPD, DM2, HTN, depression, CVA who presented w/ back pain x 3 weeks.  Pt presented w/ back pain and was found to intradiscal abscess/lumbar discitis/b/l psoas abscess on MRI status post IR guided drainage. Patient has had waxing and waning mental status with intermittently refusing dialysis. Patient's family have decided to continue hemodialysis after speaking with their PCP.   Hospital course prolonged, complicated by patient's inability to tolerate sitting in recliner chair for dialysis.  She only agrees to dialysis in the bed.  This is a barrier to discharge as no outpatient dialysis centers can accommodate.          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  Wound cx growing staph capitis.  ID consulted.  Abx de-escalated to cefazolin.  Not a good surgical candidate as per neuro surg.   Pt having severe low back and bilateral leg pain --cont cefazolin, for 6 weeks, End Date: 12/02/22 --pain management per orders -- palliative care following --transitioned from fentanyl patch + IV PRN >>> scheduled + PRN Norco on 4/17, since staff have not been using PRN fentanyl doses apparently for concern of side effects and/or hypotension.  Pain has remained uncontrolled, patient cannot tolerate any position except laying nearly flat in the bed     Confusion and agitation Hospital delirium --Pt was agitated, and also not responding to questions or commands during early hospitalization.  Mental status did start to improve, and now pt is calm and interactive,  however, at this time she has been having waxing and waning mental status --Delirium precautions --cont seroquel 50 mg nightly (new) to help with delirium and sleep.     ESRD: on HD MWF.  Pt refused HD on 10/17/22, 10/18/22 and 3/27, 4/1.   Since then, has been agreeable to dialysis without further issues, getting dialysis in hospital bed. When attempts made to do HD in chair, she did not tolerate on account of severe back pain --Recommend consideration for home hemodialysis if no facility capable to do HD in the bed 4/18 --HD access / fistula clotted off during dialysis 4/19 --pt taken for procedure to address fistula malfunction, but was agitated and combative, it was cancelled 4/20 --due to dialysis, no access at this time.  Vascular plans to attempt temp HD cath placement today if pt is amenable.  Afternoon - R femoral temp HD cath placed 4/21 --dialyzed in bed    Goals of Care  4/10 per Dr. Meriam Sprague: I understand goals of care were discussed on 11/01/2022 with patient as well as patient's family at bedside and they had understanding that there was no other option for hemodialysis without being able to sit in the recliner and so they went with the option of hospice.  Patient's family had discussion with patient's PCP who made him aware that since her low back pain is due to acute findings of discitis of L4-L5 vertebra and have not had a chance of adequate treatment of this acute discitis to determine whether her back pain will improve  to give her a chance of continuation of hemodialysis, they believe decision for hospice was premature.  I personally spoke with the family as well as patient's PCP.  We are looking at options of getting patient accepted to a hemodialysis capable facility since patient is unable to go to any facility outside those with dialysis capability.  I spoke with patient today and both patient and the family expressed interest to continue hemodialysis.     Generalized weakness /  Physical Debility:  PT recs SNF. TOC following. Pt is bedridden and very high risk for developing pressure injuries of skin -- reposition frequently.   Monitor pressures areas on skin and offload as possible.   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 H&H are stable    Hypokalemia:  4/8: K 3.6 --monitor and replete PRN    Peripheral neuropathy --  4/4 - Cautious trial of gabapentin 100 mg daily (ESRD - monitor closely for side effects) 4% - Pt notes significant improvement and no side effects so far --Continue low dose gabapentin and monitor   Hx of seizures:  continue on home dose of Keppra   HLD:  continue on statin    Hx of COPD: w/o exacerbation.  Continue on bronchodilators    DM2: well controlled HbA1c 5.2.  --d/c'ed BG checks    HTN:  --Toprol XL resumed --Continue holding losartan    GERD:  continue on PPI    Depression: severity unknown.  Continue on home dose of sertraline     Overweight: BMI 28.2.  Wt loss efforts recommended   Right foot pain - see peripheral neuropathy     DVT prophylaxis: Heparin SQ Code Status: Full code    Subjective / Interval history:  Patient reports feeling well today. Tolerated dialysis this AM.  Pain overall controlled when she's laying in bed.        Family Communication: None present on rounds, will attempt to call.   Nephrology and Palliative Care also in communications with daughter.     Consultants - Nephrology, Infectious Disease, Palliative Care   Level of care: Telemetry Medical   Dispo:   The patient is from: home Anticipated d/c is to: SNF  Anticipated d/c date is: TBD pending GOC decisions        Physical Exam:   General exam: awake alert, no acute distress Respiratory system: on room air, normal respiratory effort. Cardiovascular system: RRR, no peripheral LE edema Gastrointestinal system: soft, NT, ND  Central nervous system: normal speech no gross focal neurologic deficits Extremities:  LUE  fistula noted, R femoral temp dialysis catheter in place Skin: dry, intact, normal temperature Psychiatry: normal mood, congruent affect    Vitals:   11/14/22 0930 11/14/22 1000 11/14/22 1030 11/14/22 1100  BP: 107/79 111/69 (!) 97/55 110/66  Pulse: 74 77 78 76  Resp:  16 14   Temp:      TempSrc:      SpO2: 100% 100% 100% 100%  Weight:      Height:          Time spent: 25 minutes  Author: Pennie Banter, DO 11/14/2022 11:18 AM  For on call review [email or web address].

## 2022-11-14 NOTE — Progress Notes (Signed)
Received patient in bed to unit. Awake, calm   Informed consent signed and in chart.    TX duration:3.25     Transported By hospital transport back to room 123 Hand-off given to patient's nurse, Alfonso Patten RN    Access used: R Fem VC Access issues: Ports reversed for better delivery   Total UF removed: 1.9kg Medication(s) given:Epogen, Heparin dwells Post HD VS: Stable Post HD weight: 87.9kg    [facility]

## 2022-11-14 NOTE — Progress Notes (Signed)
[facility]  ROUNDING NOTE   Subjective:   Cynthia Dean is a 72 y.o. 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 She presents to the ED with worsening lower extremity pain. Patient will be admitted for Back pain [M54.9] Bilateral leg pain [M79.604, M79.605]  Patient seen and evaluated during dialysis   HEMODIALYSIS FLOWSHEET:  Blood Flow Rate (mL/min): 400 mL/min Arterial Pressure (mmHg): -230 mmHg Venous Pressure (mmHg): 190 mmHg TMP (mmHg): 8 mmHg Ultrafiltration Rate (mL/min): 554 mL/min Dialysate Flow Rate (mL/min): 300 ml/min Dialysis Fluid Bolus: Normal Saline Bolus Amount (mL): 100 mL (uf still off)  Appears agitated at times.   Objective:  Vital signs in last 24 hours:  Temp:  [98 F (36.7 C)-98.7 F (37.1 C)] 98 F (36.7 C) (04/22 0845) Pulse Rate:  [70-77] 77 (04/22 1000) Resp:  [10-19] 16 (04/22 1000) BP: (107-164)/(65-110) 111/69 (04/22 1000) SpO2:  [95 %-100 %] 100 % (04/22 1000) Weight:  [89.8 kg] 89.8 kg (04/22 0926)  Weight change:  Filed Weights   11/12/22 0919 11/12/22 1323 11/14/22 0926  Weight: 90.8 kg 90.3 kg 89.8 kg    Intake/Output: I/O last 3 completed shifts: In: 237 [NG/GT:237] Out: -    Intake/Output this shift:  No intake/output data recorded.  Physical Exam: General: NAD  Head: Normocephalic, atraumatic.  Moist oral mucosal membranes  Eyes: Anicteric  Lungs:  Clear to auscultation  Heart: Regular rate and rhythm  Abdomen:  Soft, nontender  Extremities: No peripheral edema.  Neurologic: Alert  Skin: No lesions  Access: Left AVG (no bruit/thrill)), right femoral temp cath placed on 11/11/2022    Basic Metabolic Panel: Recent Labs  Lab 11/08/22 0430 11/09/22 0618 11/10/22 1830 11/12/22 0912  NA 141 139 135 138  K 3.8 4.1 4.4 3.9  CL 101 101 98 101  CO2 GLUCOSE 152* 184* 177* 199*  BUN 40* 49* 51* 65*  CREATININE 7.53* 9.52* 9.28* 11.16*  CALCIUM 9.0 9.8 9.5 9.5  PHOS  --  6.3* 6.0* 7.1*     Liver Function Tests: Recent Labs  Lab 11/10/22 1830 11/12/22 0912  ALBUMIN 2.6* 2.4*    No results for input(s): "LIPASE", "AMYLASE" in the last 168 hours. No results for input(s): "AMMONIA" in the last 168 hours.  CBC: Recent Labs  Lab 11/08/22 0430 11/09/22 0618 11/10/22 0453 11/11/22 0500 11/12/22 0912  WBC 7.1 7.6 7.1 8.3 7.5  NEUTROABS 3.5 4.3 3.7 4.6  --   HGB 9.6* 10.2* 10.0* 9.1* 9.0*  HCT 32.4* 34.5* 33.4* 30.5* 30.8*  MCV 81.6 81.6 82.1 82.0 82.6  PLT 143* 148* 136* 129* 156     Cardiac Enzymes: No results for input(s): "CKTOTAL", "CKMB", "CKMBINDEX", "TROPONINI" in the last 168 hours.  BNP: Invalid input(s): "POCBNP"  CBG: No results for input(s): "GLUCAP" in the last 168 hours.   Microbiology: Results for orders placed or performed during the hospital encounter of 10/12/22  Blood culture (routine x 2)     Status: None   Collection Time: 10/12/22  5:40 PM   Specimen: BLOOD  Result Value Ref Range Status   Specimen Description BLOOD BLOOD RIGHT HAND [facility]  Final   Special Requests   Final    BOTTLES DRAWN AEROBIC AND ANAEROBIC Blood Culture results may not be optimal due to an inadequate volume of blood received in culture bottles   Culture  Final    NO GROWTH 5 DAYS Performed at [facility], [address] [place], [place] [address]    Report Status 10/17/2022 FINAL  Final  Blood culture (routine x 2)     Status: None   Collection Time: 10/12/22  6:56 PM   Specimen: BLOOD  Result Value Ref Range Status   Specimen Description BLOOD BLOOD RIGHT FOREARM  Final   Special Requests   Final    BOTTLES DRAWN AEROBIC ONLY Blood Culture results may not be optimal due to an inadequate volume of blood received in culture bottles   Culture   Final    NO GROWTH 5 DAYS Performed at [facility], [address] [place], [place] [address]    Report Status 10/17/2022 FINAL  Final  Aerobic/Anaerobic Culture w Gram Stain (surgical/deep wound)     Status: None   Collection Time: 10/13/22 11:44 AM   Specimen: Abscess  Result Value Ref Range Status   Specimen Description   Final    ABSCESS Performed at [facility], [address], [place], [place] [address]    Special Requests   Final    MUSCLE ABSCESS Performed at [facility], [address] [place], [place] [address]    Gram Stain   Final    ABUNDANT WBC PRESENT, PREDOMINANTLY PMN NO ORGANISMS SEEN    Culture   Final    FEW STAPHYLOCOCCUS CAPITIS NO ANAEROBES ISOLATED Performed at [facility], [address] [place], [place] [address]    Report Status 10/18/2022 FINAL  Final   Organism ID, Bacteria STAPHYLOCOCCUS CAPITIS  Final      Susceptibility   Staphylococcus capitis - MIC*    CIPROFLOXACIN <=0.5 SENSITIVE Sensitive     ERYTHROMYCIN >=8 RESISTANT Resistant     GENTAMICIN <=0.5 SENSITIVE Sensitive     OXACILLIN <=0.25 SENSITIVE Sensitive     TETRACYCLINE <=1 SENSITIVE Sensitive     VANCOMYCIN 1 SENSITIVE Sensitive     TRIMETH/SULFA <=10 SENSITIVE Sensitive     CLINDAMYCIN INTERMEDIATE Intermediate     RIFAMPIN <=0.5 SENSITIVE Sensitive     Inducible Clindamycin NEGATIVE Sensitive     * FEW STAPHYLOCOCCUS CAPITIS    Coagulation Studies: No results for input(s): "LABPROT", "INR" in the last 72 hours.  Urinalysis: No results for input(s): "COLORURINE", "LABSPEC", "PHURINE", "GLUCOSEU", "HGBUR", "BILIRUBINUR", "KETONESUR", "PROTEINUR", "UROBILINOGEN", "NITRITE", "LEUKOCYTESUR" in the last 72 hours.  Invalid input(s): "APPERANCEUR"    Imaging: No results found.   Medications:    anticoagulant sodium citrate      ceFAZolin (ANCEF) IV     [START ON 11/16/2022]  ceFAZolin (ANCEF) IV     [START ON 11/18/2022]  ceFAZolin (ANCEF) IV     methocarbamol (ROBAXIN) IV Stopped  (11/08/22 1455)    (feeding supplement) PROSource Plus  30 mL Oral BID BM   Chlorhexidine Gluconate Cloth  6 each Topical Q0600   cinacalcet  30 mg Oral Q supper   feeding supplement (NEPRO CARB STEADY)  237 mL Oral TID BM   fentaNYL  1 patch Transdermal Q72H   gabapentin  100 mg Oral Once per day on Mon Wed Fri   Gerhardt's butt cream   Topical QID   levETIRAcetam  1,000 mg Oral Daily   levETIRAcetam  250 mg Oral Q M,W,F   metoprolol succinate  100 mg Oral Daily   pantoprazole  20 mg Oral BID   psyllium  1 packet Oral Daily   QUEtiapine  25 mg Oral QHS   sertraline  25 mg Oral Daily   sodium chloride flush  10-40 mL Intracatheter Q12H   umeclidinium-vilanterol  1 puff Inhalation Daily   alteplase, anticoagulant sodium citrate, fentaNYL (SUBLIMAZE) injection, hydrALAZINE, lidocaine (PF), lidocaine-prilocaine, LORazepam, methocarbamol (ROBAXIN) IV, ondansetron, senna-docusate, sodium chloride flush  Assessment/ Plan:  Cynthia Dean is a 72 y.o.  female with end stage renal disease on hemodialysis, coronary artery disease, COPD with intermittent home O2, hypertension, hyperlipidemia, stroke, diabetes mellitus type II, and breast cancer who was admitted to [facility] on 10/12/2022 for Back pain [M54.9] Bilateral leg pain [M79.604, M79.605]  Patient was found to have vertebral osteomyelitis and psoas abscess. Cultures positive for staph capitis  [facility] MWF Left AVG 102.5kg  End-stage renal disease on hemodialysis. Resumed on MWF schedule.    Receiving treatment today, UF goal 0.5-1L as tolerated. Next treatment scheduled for Wednesday.   Vascular surgery will follow up for fistulogram or permcath placement.    2. Anemia of chronic kidney disease Normocytic Lab Results  Component Value Date   HGB 9.0 (L) 11/12/2022  Patient receives Mircera at outpatient clinic.  Will order low dose EPO.   3. Secondary Hyperparathyroidism: with outpatient labs: PTH 180  (3/11), phosphorus 5.4, calcium 9.3   Lab Results  Component Value Date   PTH 131 (H) 03/14/2018   CALCIUM 9.5 11/12/2022   CAION 1.09 (L) 08/28/2022   PHOS 7.1 (H) 11/12/2022   -Continue Cinacalcet.  4.  Hypotension on hemodialysis treatment. Holding home regimen of losartan, metoprolol and torsemide. Blood pressure 97/55 during dialysis.   5. Diabetes mellitus type II with chronic kidney disease/renal manifestations: insulin dependent, NPH.  Managed by primary team  6. Vertebral osteomyelitis, seen on CT lumber spine. Neurosurgery feels patient is not a surgical candidate CT guided aspiration of psoas abscess on 10/13/22. Cultures positive for Staph Capitis.   -Infectious disease recommending cefazolin with dialysis treatments. 8-week course in progress.    LOS: 33   4/22/202410:36 AM

## 2022-11-14 NOTE — Progress Notes (Signed)
Progress Note    11/14/2022 12:28 PM 3 Days Post-Op  Subjective:  Cynthia Dean is a 72 y.o. 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 She presents to the ED with worsening lower extremity pain. Patient will be admitted for Back pain Bilateral leg pain [   Nephrology consults [place] again today for a Perma Catheter dialysis catheter placement for hemodialysis with A/V fistulagram. Patient remains confused. Consent obtained through her daughter Shanyia Stines.    Vitals:   11/14/22 1130 11/14/22 1200  BP: 116/84 111/72  Pulse: 76 82  Resp:  18  Temp:    SpO2: 100% 100%   Physical Exam: Cardiac:  RRR Lungs: Clear on auscultation, no rales, rhonchi or wheezing Incisions:  Left groin with temporary dialysis catheter placement. Extremities:  Palpable pulses throughout Abdomen:   Positive bowel sounds, soft, non-tender, non-distended  Neurologic: Alert and oriented x 2   CBC    Component Value Date/Time   WBC 7.5 11/12/2022 0912   RBC 3.73 (L) 11/12/2022 0912   HGB 9.0 (L) 11/12/2022 0912   HGB 9.5 (L) 06/17/2014 0538   HCT 30.8 (L) 11/12/2022 0912   HCT 29.6 (L) 06/17/2014 0538   PLT 156 11/12/2022 0912   PLT 173 06/17/2014 0538   MCV 82.6 11/12/2022 0912   MCV 92 06/17/2014 0538   MCH 24.1 (L) 11/12/2022 0912   MCHC 29.2 (L) 11/12/2022 0912   RDW 20.7 (H) 11/12/2022 0912   RDW 16.2 (H) 06/17/2014 0538   LYMPHSABS 2.5 11/11/2022 0500   LYMPHSABS 2.5 06/17/2014 0538   MONOABS 0.9 11/11/2022 0500   MONOABS 1.0 (H) 06/17/2014 0538   EOSABS 0.3 11/11/2022 0500   EOSABS 0.2 06/17/2014 0538   BASOSABS 0.1 11/11/2022 0500   BASOSABS 0.1 06/17/2014 0538    BMET    Component Value Date/Time   NA 138 11/12/2022 0912   NA 135 (L) 06/16/2014 0915   K 3.9 11/12/2022 0912   K 4.8 06/16/2014 0915   CL 101 11/12/2022 0912   CL 100  06/16/2014 0915   CO2 23 11/12/2022 0912   CO2 22 06/16/2014 0915   GLUCOSE 199 (H) 11/12/2022 0912   GLUCOSE 226 (H) 06/16/2014 0915   BUN 65 (H) 11/12/2022 0912   BUN 47 (H) 06/16/2014 0915   CREATININE 11.16 (H) 11/12/2022 0912   CREATININE 7.60 (H) 06/16/2014 0915   CALCIUM 9.5 11/12/2022 0912   CALCIUM 7.2 (L) 06/16/2014 0915   GFRNONAA 3 (L) 11/12/2022 0912   GFRNONAA 6 (L) 06/16/2014 0915   GFRNONAA 6 (L) 04/07/2014 0918   GFRAA 5 (L) 01/30/2020 0500   GFRAA 7 (L) 06/16/2014 0915   GFRAA 7 (L) 04/07/2014 0918    INR    Component Value Date/Time   INR 1.7 (H) 03/24/2022 1656   INR 1.0 06/17/2014 0538     Intake/Output Summary (Last 24 hours) at 11/14/2022 1228 Last data filed at 11/14/2022 0600 Gross per 24 hour  Intake 237 ml  Output --  Net 237 ml     Assessment/Plan:  73 y.o. female is s/p left groin temporary dialysis catheter and admitted for back pain and leg pain  3 Days Post-Op   PLAN: Skin surgery plans on taking the patient to the vascular lab for an AV/V fistulogram and permacatheter dialysis catheter placement on Wednesday, 11/16/2022.  Patient was made aware but she is confused.  I called  the patient's daughter Cynthia Dean and discussed the procedure, benefits, risks, complications.  She gives me consent over the phone to proceed with the procedure on Wednesday.  She also verbalizes her understanding.  I answered all her questions again today.  Will be made n.p.o. after midnight on Wednesday.  Discussed the plan in detail with Dr. Festus Barren MD and he agrees with the plan.  DVT prophylaxis:  NONE   Matti Killingsworth R Annasophia Crocker [facility] 11/14/2022 12:28 PM

## 2022-11-14 NOTE — Progress Notes (Signed)
Physical Therapy Treatment Patient Details Name: Cynthia Dean MRN: [id number] DOB: [birth year] Today's Date: 11/14/2022   History of Present Illness Cynthia Dean is a 72 y.o. 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 She presents to the ED with worsening lower extremity pain.    PT Comments    Pt alert, initially speaks to PT, family at bedside. With attempted mobility and bed repositioning pt cries out in pain but will not tell PT where her pain is located, and does reach to grab therapist. Initially able to perform AAROM on bilateral LE but refuses any further attempts at mobility or conversation. Bed placed in chair position. PT to attempt one more session to assess pt's mobility and ability/willingness to participate with therapy.      Recommendations for follow up therapy are one component of a multi-disciplinary discharge planning process, led by the attending physician.  Recommendations may be updated based on patient status, additional functional criteria and insurance authorization.  Follow Up Recommendations  Can patient physically be transported by private vehicle: No    Assistance Recommended at Discharge Frequent or constant Supervision/Assistance  Patient can return home with the following Two people to help with walking and/or transfers;A lot of help with bathing/dressing/bathroom;Assistance with cooking/housework;Direct supervision/assist for medications management;Direct supervision/assist for financial management;Assist for transportation;Help with stairs or ramp for entrance   Equipment Recommendations  Rolling walker (2 wheels)    Recommendations for Other Services       Precautions / Restrictions Precautions Precautions: Fall Restrictions Weight Bearing Restrictions: No     Mobility  Bed Mobility                General bed mobility comments: unable this date 2+ to reposition in bed. bed put in chair position    Transfers                        Ambulation/Gait                   Stairs             Wheelchair Mobility    Modified Rankin (Stroke Patients Only)       Balance                                            Cognition Arousal/Alertness: Awake/alert Behavior During Therapy: Flat affect                                   General Comments: pt agitated with any mobility attempts        Exercises General Exercises - Lower Extremity Heel Slides: AAROM, Both, 5 reps    General Comments        Pertinent Vitals/Pain Pain Assessment Faces Pain Scale: Hurts whole lot Pain Location: yelling in pain with repositioning in bed Pain Descriptors / Indicators: Guarding, Grimacing, Moaning Pain Intervention(s): Limited activity within patient's tolerance, Monitored during session, Repositioned    Home Living                          Prior Function  PT Goals (current goals can now be found in the care plan section) Progress towards PT goals: Not progressing toward goals - comment (pt limited by pain and agitation)    Frequency    Min 2X/week      PT Plan Current plan remains appropriate    Co-evaluation              AM-PAC PT "6 Clicks" Mobility   Outcome Measure  Help needed turning from your back to your side while in a flat bed without using bedrails?: Total Help needed moving from lying on your back to sitting on the side of a flat bed without using bedrails?: Total Help needed moving to and from a bed to a chair (including a wheelchair)?: Total Help needed standing up from a chair using your arms (e.g., wheelchair or bedside chair)?: Total Help needed to walk in hospital room?: Total Help needed climbing 3-5 steps with a railing? : Total 6 Click Score: 6    End of  Session   Activity Tolerance: Patient limited by pain;Treatment limited secondary to agitation Patient left: in bed;with call bell/phone within reach;with bed alarm set Nurse Communication: Mobility status PT Visit Diagnosis: Unsteadiness on feet (R26.81);Other abnormalities of gait and mobility (R26.89);Muscle weakness (generalized) (M62.81);Difficulty in walking, not elsewhere classified (R26.2)     Time: 7829-5621 PT Time Calculation (min) (ACUTE ONLY): 14 min  Charges:  $Therapeutic Activity: 8-22 mins                     Olga Coaster PT, DPT 3:50 PM,11/14/22

## 2022-11-14 NOTE — Progress Notes (Signed)
OT Cancellation Note  Patient Details Name: Cynthia Dean MRN: [id number] DOB: 05/18/1951   Cancelled Treatment:    Reason Eval/Treat Not Completed: Patient at procedure or test/ unavailable;Other (comment).  1130: Pt currently off the floor at HD. Will re-attempt as able.   1550: Notified by PT that pt agitated/combative during their session. Will re-attempt at later date/time.  Gerrie Nordmann 11/14/2022, 3:49 PM

## 2022-11-15 ENCOUNTER — Encounter (INDEPENDENT_AMBULATORY_CARE_PROVIDER_SITE_OTHER): Payer: 59

## 2022-11-15 ENCOUNTER — Ambulatory Visit (INDEPENDENT_AMBULATORY_CARE_PROVIDER_SITE_OTHER): Payer: 59 | Admitting: Nurse Practitioner

## 2022-11-15 DIAGNOSIS — Z7189 Other specified counseling: Secondary | ICD-10-CM | POA: Diagnosis not present

## 2022-11-15 DIAGNOSIS — M79604 Pain in right leg: Secondary | ICD-10-CM | POA: Diagnosis not present

## 2022-11-15 DIAGNOSIS — M4646 Discitis, unspecified, lumbar region: Secondary | ICD-10-CM | POA: Diagnosis not present

## 2022-11-15 DIAGNOSIS — N186 End stage renal disease: Secondary | ICD-10-CM | POA: Diagnosis not present

## 2022-11-15 DIAGNOSIS — K6812 Psoas muscle abscess: Secondary | ICD-10-CM | POA: Diagnosis not present

## 2022-11-15 MED ORDER — FENTANYL CITRATE PF 50 MCG/ML IJ SOSY
12.5000 ug | PREFILLED_SYRINGE | Freq: Once | INTRAMUSCULAR | Status: AC
  Administered 2022-11-15: 12.5 ug via INTRAVENOUS

## 2022-11-15 NOTE — Plan of Care (Signed)
PMT following. Patient allowed vascular surgery to place temporary access for dialysis and has completed dialysis over the weekend. Vascular surgery to return patient to OR tomorrow for perm cath placement. PMT will continue to follow and assist as able.

## 2022-11-15 NOTE — Progress Notes (Signed)
Occupational Therapy Treatment Patient Details Name: Cynthia Dean MRN: [id number] DOB: Dec 23, 1950 Today's Date: 11/15/2022   History of present illness Cynthia Dean is a 72 y.o. 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 She presents to the ED with worsening lower extremity pain.   OT comments  Pt seen for final attempt for OT. Co-tx with PT to optimize participation and attempts for EOB. Pt wakes to voices, found with torso and head against far L bed rail. Pt required increased time for processing, short simple commands with repetition to follow simple commands, and ultimately very fearful of any movement, yelling out with attempts for sup>EOB despite max encouragement, emotional support, verbal cues leading up to attempt, and actively pushes against therapists to prevent attempts to sit EOB. Required TOTAL ASSIST x2 for attempt. Returned to midline in bed, repositioned for comfort and pressure relief. Family in roo at end of session. Pt unable to meaningfully participate in acute OT at this time. Will sign off at this time. Please re-consult if additional acute OT needs arise, pending progress/change in pt status.    Recommendations for follow up therapy are one component of a multi-disciplinary discharge planning process, led by the attending physician.  Recommendations may be updated based on patient status, additional functional criteria and insurance authorization.    Assistance Recommended at Discharge Frequent or constant Supervision/Assistance  Patient can return home with the following  Two people to help with walking and/or transfers;Help with stairs or ramp for entrance;Assistance with cooking/housework;Assist for transportation;Direct supervision/assist for financial management;Direct supervision/assist for medications management;Two people  to help with bathing/dressing/bathroom   Equipment Recommendations  None recommended by OT    Recommendations for Other Services      Precautions / Restrictions Precautions Precautions: Fall Precaution Comments: R chest port; R temp fem cath Restrictions Weight Bearing Restrictions: No       Mobility Bed Mobility Overal bed mobility: Needs Assistance       Supine to sit: Total assist, +2 for physical assistance Sit to supine: Total assist, +2 for physical assistance   General bed mobility comments: TOTAL A +2 for attempt but unable to fully get upright seated EOB; pt very fearful    Transfers                   General transfer comment: Currently unable/unsafe to attempt     Balance Overall balance assessment: Needs assistance   Sitting balance-Leahy Scale: Zero                                     ADL either performed or assessed with clinical judgement   ADL                                         General ADL Comments: Continues to be significantly self limiting, declining to participate in ADL despite encouragement and assist available.    Extremity/Trunk Assessment              Vision       Perception     Praxis      Cognition Arousal/Alertness: Awake/alert Behavior During Therapy: Flat affect Overall Cognitive Status: No family/caregiver present to determine baseline cognitive functioning  General Comments: Flat, increased processing time, cues, requires max cues/encouragement to follow simple commands, very fearful with any movement        Exercises Other Exercises Other Exercises: Pt educated on importance of bed mobility and repositioning for comfort, pressure relief, injury prevention, and improving strength/tolerance for regaining any mobility. Continues to self limit citing fear and actively resists attempts for movement despite max encouragement and  emotional support.    Shoulder Instructions       General Comments      Pertinent Vitals/ Pain       Pain Assessment Pain Assessment: Faces Faces Pain Scale: Hurts whole lot Pain Location: screams out with attempts to sit EOB, states "I'm scared, what are you doing?" Pain Descriptors / Indicators: Guarding, Grimacing, Moaning Pain Intervention(s): Limited activity within patient's tolerance, Repositioned  Home Living                                          Prior Functioning/Environment              Frequency           Progress Toward Goals  OT Goals(current goals can now be found in the care plan section)  Progress towards OT goals: Not progressing toward goals - comment (pt unable to tolerate participation)  Acute Rehab OT Goals Patient Stated Goal: to improve pain OT Goal Formulation: With patient Time For Goal Achievement: 11/17/22 Potential to Achieve Goals: Poor  Plan Discharge plan remains appropriate;Other (comment) (unable to participate in therapy at this time)    Co-evaluation    PT/OT/SLP Co-Evaluation/Treatment: Yes Reason for Co-Treatment: Complexity of the patient's impairments (multi-system involvement);Necessary to address cognition/behavior during functional activity;For patient/therapist safety;To address functional/ADL transfers PT goals addressed during session: Mobility/safety with mobility OT goals addressed during session: ADL's and self-care      AM-PAC OT "6 Clicks" Daily Activity     Outcome Measure   Help from another person eating meals?: A Little Help from another person taking care of personal grooming?: A Little Help from another person toileting, which includes using toliet, bedpan, or urinal?: Total Help from another person bathing (including washing, rinsing, drying)?: Total Help from another person to put on and taking off regular upper body clothing?: A Lot Help from another person to put on and  taking off regular lower body clothing?: Total 6 Click Score: 11    End of Session    OT Visit Diagnosis: Other abnormalities of gait and mobility (R26.89);Muscle weakness (generalized) (M62.81)   Activity Tolerance Patient limited by pain;Other (comment) (fear)   Patient Left in bed;with call bell/phone within reach;with bed alarm set;with family/visitor present   Nurse Communication          Time: 4098-1191 OT Time Calculation (min): 24 min  Charges: OT General Charges $OT Visit: 1 Visit OT Treatments $Therapeutic Activity: 8-22 mins  Arman Filter., MPH, MS, OTR/L ascom [phone] 11/15/22, 2:38 PM

## 2022-11-15 NOTE — Progress Notes (Signed)
Nutrition Follow-up  DOCUMENTATION CODES:   Not applicable  INTERVENTION:   -Continue liberalized diet of regular -Continue renal MVI daily -Continue 30 ml Prosource Plus BID, each supplement provides 100 kcals and 15 grams protein -Continue Nepro Shake po TID, each supplement provides 425 kcal and 19 grams protein   NUTRITION DIAGNOSIS:   Increased nutrient needs related to chronic illness (ESRD on HD) as evidenced by estimated needs.  Ongoing  GOAL:   Patient will meet greater than or equal to 90% of their needs  Progressing   MONITOR:   PO intake, Supplement acceptance  REASON FOR ASSESSMENT:   Malnutrition Screening Tool    ASSESSMENT:   Pt with PMH of ESRD on HD, HLD, COPD, DM2, HTN, depression, CVA who presents with back pain x 3 weeks PTA.  4/19- triple lumen HD cath placed secondary to clotted AVG  Reviewed I/O's: -1 L x 24 hours and -2.1 L since 11/01/22  Pt receiving nursing care at time of visit. Per chart review, pt has been confused.   Pt with variable oral intake. Noted meal completions 0-100%. Pt has been very inconsistent with oral nutrition supplements.    Reviewed wt hx; pt has experienced a 2.8% wt loss over the past week, which is significant for time frame. Suspect fluid losses may also be contributing (-2.1 L since 11/01/22).   Medications reviewed and include sensipar, epogen, keppra, and metamucil.    Per palliative care following for goals of care; pt daughter leaning towards comfort care as pt has spoken about stopping dialysis.   Labs reviewed: Phos: 7.1, CBGS: 148 (inpatient orders for glycemic control are none).    Diet Order:   Diet Order             Diet regular Room service appropriate? Yes; Fluid consistency: Thin  Diet effective now                   EDUCATION NEEDS:   No education needs have been identified at this time  Skin:  Skin Assessment: Skin Integrity Issues: Skin Integrity Issues:: Other (Comment) Other:  IAD to lt buttocks  Last BM:  11/07/22  Height:   Ht Readings from Last 1 Encounters:  10/12/22 6' (1.829 m)    Weight:   Wt Readings from Last 1 Encounters:  11/14/22 87.9 kg    Ideal Body Weight:  72.7 kg  BMI:  Body mass index is 26.28 kg/m.  Estimated Nutritional Needs:   Kcal:  2150-2350  Protein:  105-120 grams  Fluid:  1000 ml + UOP    Levada Schilling, RD, LDN, CDCES Registered Dietitian II Certified Diabetes Care and Education Specialist Please refer to [facility] for RD and/or RD on-call/weekend/after hours pager

## 2022-11-15 NOTE — Progress Notes (Signed)
Physical Therapy Treatment Patient Details Name: Cynthia Dean MRN: [id number] DOB: [birth year] Today's Date: 11/15/2022   History of Present Illness Cynthia Dean is a 72 y.o. 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 She presents to the ED with worsening lower extremity pain.    PT Comments    Pt wakes to voice, seen with OT to maximize pt/therapist safety and function. Extended time spent during session to educate and encourage patient to participate with therapy; pt referenced pain and anxiety/fear as a barrier for mobility. Despite max multimodal cueing, extended time, and step by step sequencing, pt totalAx2 to attempt to sit EOB. Pt very resistant to mobility, yells "I can't, stop" with any mobility attempts. PT at this time to sign off due to limited progression towards goals with many session attempts as well as pt's limited ability to participate meaningfully with therapy. Please re-consult as appropriate.     Recommendations for follow up therapy are one component of a multi-disciplinary discharge planning process, led by the attending physician.  Recommendations may be updated based on patient status, additional functional criteria and insurance authorization.  Follow Up Recommendations  Can patient physically be transported by private vehicle: No    Assistance Recommended at Discharge Frequent or constant Supervision/Assistance  Patient can return home with the following Two people to help with walking and/or transfers;A lot of help with bathing/dressing/bathroom;Assistance with cooking/housework;Direct supervision/assist for medications management;Direct supervision/assist for financial management;Assist for transportation;Help with stairs or ramp for entrance   Equipment Recommendations  None recommended by PT    Recommendations  for Other Services       Precautions / Restrictions Precautions Precautions: Fall Precaution Comments: R chest port; R temp fem cath Restrictions Weight Bearing Restrictions: No     Mobility  Bed Mobility Overal bed mobility: Needs Assistance Bed Mobility: Supine to Sit, Sit to Supine     Supine to sit: Total assist, +2 for physical assistance Sit to supine: Total assist, +2 for physical assistance   General bed mobility comments: TOTAL A +2 for attempt but unable to fully get upright seated EOB; pt very fearful and resistant to movement    Transfers                        Ambulation/Gait                   Stairs             Wheelchair Mobility    Modified Rankin (Stroke Patients Only)       Balance Overall balance assessment: Needs assistance                                          Cognition Arousal/Alertness: Awake/alert Behavior During Therapy: Flat affect Overall Cognitive Status: No family/caregiver present to determine baseline cognitive functioning                                 General Comments: Flat, increased processing time, cues, requires max cues/encouragement to follow simple commands, very fearful with any movement        Exercises      General Comments        Pertinent Vitals/Pain  Pain Assessment Pain Assessment: Faces Faces Pain Scale: Hurts whole lot Pain Location: screams out with attempts to sit EOB, states "I'm scared, what are you doing?" Pain Descriptors / Indicators: Guarding, Grimacing, Moaning Pain Intervention(s): Limited activity within patient's tolerance, Monitored during session, Repositioned    Home Living                          Prior Function            PT Goals (current goals can now be found in the care plan section) Progress towards PT goals: Not progressing toward goals - comment (limited by fear, cognition)    Frequency    Other  (Comment) (discharge from therapy services)      PT Plan Frequency needs to be updated    Co-evaluation PT/OT/SLP Co-Evaluation/Treatment: Yes Reason for Co-Treatment: Complexity of the patient's impairments (multi-system involvement);Necessary to address cognition/behavior during functional activity;For patient/therapist safety;To address functional/ADL transfers PT goals addressed during session: Mobility/safety with mobility OT goals addressed during session: ADL's and self-care      AM-PAC PT "6 Clicks" Mobility   Outcome Measure  Help needed turning from your back to your side while in a flat bed without using bedrails?: Total Help needed moving from lying on your back to sitting on the side of a flat bed without using bedrails?: Total Help needed moving to and from a bed to a chair (including a wheelchair)?: Total Help needed standing up from a chair using your arms (e.g., wheelchair or bedside chair)?: Total Help needed to walk in hospital room?: Total Help needed climbing 3-5 steps with a railing? : Total 6 Click Score: 6    End of Session   Activity Tolerance: Patient limited by pain;Other (comment) (cognition, fear) Patient left: in bed;with call bell/phone within reach;with bed alarm set;with family/visitor present Nurse Communication: Mobility status PT Visit Diagnosis: Unsteadiness on feet (R26.81);Other abnormalities of gait and mobility (R26.89);Muscle weakness (generalized) (M62.81);Difficulty in walking, not elsewhere classified (R26.2)     Time: 8295-6213 PT Time Calculation (min) (ACUTE ONLY): 23 min  Charges:  $Therapeutic Activity: 8-22 mins                     Olga Coaster PT, DPT 2:59 PM,11/15/22

## 2022-11-15 NOTE — Progress Notes (Signed)
[facility]  ROUNDING NOTE   Subjective:   Cynthia Dean is a 72 y.o. 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 She presents to the ED with worsening lower extremity pain. Patient will be admitted for Back pain [M54.9] Bilateral leg pain [M79.604, M79.605]  Patient seen laying quietly in bed No family present Awaiting breakfast Denies pain or discomfort  Patient seen later with PT/OT at bedside, attempted to dangle patient Patient encouraged to cooperate with therapy to regain strength.   Objective:  Vital signs in last 24 hours:  Temp:  [97.7 F (36.5 C)-98.7 F (37.1 C)] 97.7 F (36.5 C) (04/23 0831) Pulse Rate:  [76-84] 79 (04/23 0831) Resp:  [16-20] 20 (04/23 0831) BP: (106-127)/(32-84) 114/32 (04/23 0831) SpO2:  [92 %-100 %] 99 % (04/23 0831) Weight:  [87.9 kg] 87.9 kg (04/22 1300)  Weight change:  Filed Weights   11/12/22 1323 11/14/22 0926 11/14/22 1300  Weight: 90.3 kg 89.8 kg 87.9 kg    Intake/Output: I/O last 3 completed shifts: In: 237 [NG/GT:237] Out: 1000 [Other:1000]   Intake/Output this shift:  No intake/output data recorded.  Physical Exam: General: NAD  Head: Normocephalic, atraumatic.  Moist oral mucosal membranes  Eyes: Anicteric  Lungs:  Clear to auscultation  Heart: Regular rate and rhythm  Abdomen:  Soft, nontender  Extremities: No peripheral edema.  Neurologic: Alert  Skin: No lesions  Access: Left AVG (no bruit/thrill)), right femoral temp cath placed on 11/11/2022    Basic Metabolic Panel: Recent Labs  Lab 11/09/22 0618 11/10/22 1830 11/12/22 0912  NA 139 135 138  K 4.1 4.4 3.9  CL 101 98 101  CO2 GLUCOSE 184* 177* 199*  BUN 49* 51* 65*  CREATININE 9.52* 9.28* 11.16*  CALCIUM 9.8 9.5 9.5  PHOS 6.3* 6.0* 7.1*     Liver Function Tests: Recent Labs  Lab  11/10/22 1830 11/12/22 0912  ALBUMIN 2.6* 2.4*    No results for input(s): "LIPASE", "AMYLASE" in the last 168 hours. No results for input(s): "AMMONIA" in the last 168 hours.  CBC: Recent Labs  Lab 11/09/22 0618 11/10/22 0453 11/11/22 0500 11/12/22 0912  WBC 7.6 7.1 8.3 7.5  NEUTROABS 4.3 3.7 4.6  --   HGB 10.2* 10.0* 9.1* 9.0*  HCT 34.5* 33.4* 30.5* 30.8*  MCV 81.6 82.1 82.0 82.6  PLT 148* 136* 129* 156     Cardiac Enzymes: No results for input(s): "CKTOTAL", "CKMB", "CKMBINDEX", "TROPONINI" in the last 168 hours.  BNP: Invalid input(s): "POCBNP"  CBG: No results for input(s): "GLUCAP" in the last 168 hours.   Microbiology: Results for orders placed or performed during the hospital encounter of 10/12/22  Blood culture (routine x 2)     Status: None   Collection Time: 10/12/22  5:40 PM   Specimen: BLOOD  Result Value Ref Range Status   Specimen Description BLOOD BLOOD RIGHT HAND [facility]  Final   Special Requests   Final    BOTTLES DRAWN AEROBIC AND ANAEROBIC Blood Culture results may not be optimal due to an inadequate volume of blood received in culture bottles   Culture   Final    NO GROWTH 5 DAYS Performed at [facility], [address] [place], [place] [address]    Report Status 10/17/2022 FINAL  Final  Blood culture (routine x 2)     Status: None   Collection Time:  10/12/22  6:56 PM   Specimen: BLOOD  Result Value Ref Range Status   Specimen Description BLOOD BLOOD RIGHT FOREARM  Final   Special Requests   Final    BOTTLES DRAWN AEROBIC ONLY Blood Culture results may not be optimal due to an inadequate volume of blood received in culture bottles   Culture   Final    NO GROWTH 5 DAYS Performed at [facility], [address] [place], [place] [address]    Report Status 10/17/2022 FINAL  Final  Aerobic/Anaerobic Culture w Gram Stain (surgical/deep wound)     Status: None   Collection Time: 10/13/22 11:44 AM   Specimen: Abscess   Result Value Ref Range Status   Specimen Description   Final    ABSCESS Performed at [facility], [address] [place], [place] [address]    Special Requests   Final    MUSCLE ABSCESS Performed at [facility], [address] [place], [place] [address]    Gram Stain   Final    ABUNDANT WBC PRESENT, PREDOMINANTLY PMN NO ORGANISMS SEEN    Culture   Final    FEW STAPHYLOCOCCUS CAPITIS NO ANAEROBES ISOLATED Performed at [facility], [address] [place], [place] [address]    Report Status 10/18/2022 FINAL  Final   Organism ID, Bacteria STAPHYLOCOCCUS CAPITIS  Final      Susceptibility   Staphylococcus capitis - MIC*    CIPROFLOXACIN <=0.5 SENSITIVE Sensitive     ERYTHROMYCIN >=8 RESISTANT Resistant     GENTAMICIN <=0.5 SENSITIVE Sensitive     OXACILLIN <=0.25 SENSITIVE Sensitive     TETRACYCLINE <=1 SENSITIVE Sensitive     VANCOMYCIN 1 SENSITIVE Sensitive     TRIMETH/SULFA <=10 SENSITIVE Sensitive     CLINDAMYCIN INTERMEDIATE Intermediate     RIFAMPIN <=0.5 SENSITIVE Sensitive     Inducible Clindamycin NEGATIVE Sensitive     * FEW STAPHYLOCOCCUS CAPITIS    Coagulation Studies: No results for input(s): "LABPROT", "INR" in the last 72 hours.  Urinalysis: No results for input(s): "COLORURINE", "LABSPEC", "PHURINE", "GLUCOSEU", "HGBUR", "BILIRUBINUR", "KETONESUR", "PROTEINUR", "UROBILINOGEN", "NITRITE", "LEUKOCYTESUR" in the last 72 hours.  Invalid input(s): "APPERANCEUR"    Imaging: No results found.   Medications:    anticoagulant sodium citrate      ceFAZolin (ANCEF) IV 2 g (11/14/22 1845)   [START ON 11/16/2022]  ceFAZolin (ANCEF) IV     [START ON 11/18/2022]  ceFAZolin (ANCEF) IV     methocarbamol (ROBAXIN) IV Stopped (11/08/22 1455)    (feeding supplement) PROSource Plus  30 mL Oral BID BM   Chlorhexidine Gluconate Cloth  6 each Topical Q0600   cinacalcet  30 mg Oral Q supper   epoetin (EPOGEN/PROCRIT) injection  4,000 Units  Intravenous Q M,W,F-HD   feeding supplement (NEPRO CARB STEADY)  237 mL Oral TID BM   fentaNYL  1 patch Transdermal Q72H   gabapentin  100 mg Oral Once per day on Mon Wed Fri   Gerhardt's butt cream   Topical QID   levETIRAcetam  1,000 mg Oral Daily   levETIRAcetam  250 mg Oral Q M,W,F   metoprolol succinate  100 mg Oral Daily   pantoprazole  20 mg Oral BID   psyllium  1 packet Oral Daily   QUEtiapine  25 mg Oral QHS   sertraline  25 mg Oral Daily   sodium chloride flush  10-40 mL Intracatheter Q12H   umeclidinium-vilanterol  1 puff Inhalation Daily  alteplase, anticoagulant sodium citrate, fentaNYL (SUBLIMAZE) injection, hydrALAZINE, lidocaine (PF), lidocaine-prilocaine, LORazepam, methocarbamol (ROBAXIN) IV, ondansetron, senna-docusate, sodium chloride flush  Assessment/ Plan:  Ms. Reaghan Kawa is a 72 y.o.  female with end stage renal disease on hemodialysis, coronary artery disease, COPD with intermittent home O2, hypertension, hyperlipidemia, stroke, diabetes mellitus type II, and breast cancer who was admitted to [facility] on 10/12/2022 for Back pain [M54.9] Bilateral leg pain [M79.604, M79.605]  Patient was found to have vertebral osteomyelitis and psoas abscess. Cultures positive for staph capitis  [facility] MWF Left AVG 102.5kg  End-stage renal disease on hemodialysis. Resumed on MWF schedule.    Patient received dialysis yesterday, UF 1L achieved. Vascular plans to place permcath in am. Will perform scheduled dialysis after placement.  2. Anemia of chronic kidney disease Normocytic Lab Results  Component Value Date   HGB 9.0 (L) 11/12/2022  Patient receives Mircera at outpatient clinic.  Continue low dose EPO.   3. Secondary Hyperparathyroidism: with outpatient labs: PTH 180 (3/11), phosphorus 5.4, calcium 9.3   Lab Results  Component Value Date   PTH 131 (H) 03/14/2018   CALCIUM 9.5 11/12/2022   CAION 1.09 (L) 08/28/2022   PHOS 7.1 (H)  11/12/2022   -Continue Cinacalcet. -Will continue to monitor bone minerals during this admission.  4.  Hypotension on hemodialysis treatment. Holding home regimen of losartan, metoprolol and torsemide. Blood pressure 114/32  5. Diabetes mellitus type II with chronic kidney disease/renal manifestations: insulin dependent, NPH.  Managed by primary team  6. Vertebral osteomyelitis, seen on CT lumber spine. Neurosurgery feels patient is not a surgical candidate CT guided aspiration of psoas abscess on 10/13/22. Cultures positive for Staph Capitis.   -Infectious disease recommending cefazolin with dialysis treatments. 8-week course in progress.    LOS: 34   4/23/202411:29 AM

## 2022-11-15 NOTE — Progress Notes (Signed)
Daily Progress Note   Patient Name: Cynthia Dean       Date: 11/15/2022 DOB: January 15, 1951  Age: 72 y.o. MRN#: [id number] Attending Physician: Pennie Banter, DO Primary Care Physician: Loistine Chance, MD Admit Date: 10/12/2022  Reason for Consultation/Follow-up: Establishing goals of care  Subjective: Called to bedside as family has questions about patient's care.  In to bedside.  Patient's daughter [name] was at bedside with 2 of her daughters.  Daughter is able to articulate patient's condition and situation, and concerns for wanting to honor her mother in care moving forward.  Primary MD entered conversation.  Patient states that she no longer wants to continue dialysis, and understands that she would die if she stops dialysis.  She does acknowledge wanting to make decisions that her family members  are accepting of.  Daughter discusses wanting to make sure that her mother is comfortable and is not suffering; she favors stopping dialysis as this is what the patient states that she wants.  She acknowledges that ultimately [name] is healthcare power of attorney.    Stepped out and called to speak with [name], with attending MD in room on speaker phone.  We discussed the patient's status, imaging, and her stating that she wants to stop dialysis.  [name] states that this is because [name] was at the bedside.  We discussed the importance of finding out what the patient's wishes are and then honoring her wishes what ever they are.  We discussed the overall big picture and care moving forward.  [name] states that if her mother truly wants to stop dialysis that she will be amenable to this but wants to come and speak with her first.   Of note, [name] states that because of a  conversation with family this morning, she has changed her mind regarding family updates on patient's information; she states she no longer wants any other family members to receive information on the patient's care besides herself, or patient's boyfriend [name].  Discussed that I would note this change in my documentation.  Attending is aware as she was on speaker phone during conversation.   Discussed that I would follow-up with her mother tomorrow to try to discern what the patient truly wants to do.  Length of Stay: 34  Current Medications: Scheduled Meds:   (feeding  supplement) PROSource Plus  30 mL Oral BID BM   Chlorhexidine Gluconate Cloth  6 each Topical Q0600   cinacalcet  30 mg Oral Q supper   epoetin (EPOGEN/PROCRIT) injection  4,000 Units Intravenous Q M,W,F-HD   feeding supplement (NEPRO CARB STEADY)  237 mL Oral TID BM   fentaNYL  1 patch Transdermal Q72H   gabapentin  100 mg Oral Once per day on Mon Wed Fri   Gerhardt's butt cream   Topical QID   levETIRAcetam  1,000 mg Oral Daily   levETIRAcetam  250 mg Oral Q M,W,F   metoprolol succinate  100 mg Oral Daily   pantoprazole  20 mg Oral BID   psyllium  1 packet Oral Daily   QUEtiapine  25 mg Oral QHS   sertraline  25 mg Oral Daily   sodium chloride flush  10-40 mL Intracatheter Q12H   umeclidinium-vilanterol  1 puff Inhalation Daily    Continuous Infusions:  anticoagulant sodium citrate      ceFAZolin (ANCEF) IV 2 g (11/14/22 1845)   [START ON 11/16/2022]  ceFAZolin (ANCEF) IV     [START ON 11/18/2022]  ceFAZolin (ANCEF) IV     methocarbamol (ROBAXIN) IV Stopped (11/08/22 1455)    PRN Meds: alteplase, anticoagulant sodium citrate, fentaNYL (SUBLIMAZE) injection, hydrALAZINE, lidocaine (PF), lidocaine-prilocaine, LORazepam, methocarbamol (ROBAXIN) IV, ondansetron, senna-docusate, sodium chloride flush  Physical Exam Pulmonary:     Effort: Pulmonary effort is normal.  Neurological:     Mental Status: She is alert.              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/17/2022   Back pain 10/12/2022   Discitis of lumbar region 10/12/2022   Myositis 10/12/2022   Hemoglobin drop 08/28/2022   Chronic respiratory failure with hypoxia 08/26/2022   Acute on chronic blood loss anemia 08/26/2022   History of lower GI bleeding 08/26/2022   Insulin-requiring or dependent type II diabetes mellitus 08/26/2022   Chronic anticoagulation 08/26/2022   History of pulmonary embolism 03/20/2022 08/26/2022   Breast cancer, left S/P partial mastectomy,03/04/2022 08/26/2022   Generalized weakness    Unable to ambulate 05/09/2022   ESRD on dialysis    Bilateral leg pain    Depression    Pulmonary emboli    Obesity with body mass index (BMI) of 30.0 to 39.9    Anemia    Chronic diastolic CHF (congestive heart failure)    Seroma of breast    Bacteremia due to coagulase-negative Staphylococcus    Left breast abscess    Urinary tract infection 03/22/2022   Anemia of chronic disease 03/22/2022   Acute pulmonary embolism without acute cor pulmonale 03/20/2022   Dyslipidemia 03/20/2022   Diabetic neuropathy 03/20/2022   Sepsis due to cellulitis 03/20/2022  Delay kidney tx func d/t fluid overload requiring acute dialysis 01/29/2020   Delay kidney tx func d/t ATN and fluid overload require acute dialysis 01/29/2020   Hypoglycemia 10/21/2019   Unresponsiveness  10/21/2019   GERD (gastroesophageal reflux disease) 10/21/2019   Hypothermia    Sepsis with encephalopathy without septic shock    Diarrhea    Acute metabolic encephalopathy    Acute on chronic diastolic CHF (congestive heart failure)    Acute on chronic respiratory failure with hypoxia 07/30/2019   Rectal bleed 02/13/2019   Acute respiratory failure with hypoxia 12/31/2018   History of CVA (cerebrovascular accident) 05/29/2018   Aphasia as late effect of cerebrovascular accident (CVA) 05/01/2018   Seizure disorder 03/14/2018   Stroke 03/07/2018   Slurred speech 11/14/2017   Type 2 diabetes mellitus with ESRD (end-stage renal disease) 11/12/2017   CAD (coronary artery disease) 11/12/2017   COPD (chronic obstructive pulmonary disease) 11/12/2017   ESRD on hemodialysis 11/12/2017   CVA (cerebral vascular accident) 04/14/2016   Essential hypertension 11/27/2015   Hyperlipidemia 11/27/2015   Prolonged Q-T interval on ECG 11/26/2015   Aphasia complicating stroke 11/05/2015   Cervical spine arthritis 07/27/2015   Diabetic retinopathy without macular edema associated with type 2 diabetes mellitus 07/27/2015   Osteoarthritis of spine with radiculopathy, lumbar region 07/27/2015   Obesity, Class III, BMI 40-49.9 (morbid obesity) 07/27/2015   Vitamin D deficiency 01/21/2011   Diastolic heart failure 10/27/2010    Palliative Care Assessment & Plan   Recommendations/Plan: [name] considering care moving forward. Moving forward, [name] states she has changed her mind on family updates and does not want patient information shared with any other family members besides herself, or patient's boyfriend [name].  Code Status:    Code Status Orders  (From admission, onward)           Start     Ordered   11/01/22 1535  Do not attempt resuscitation (DNR)  Continuous       Question Answer Comment  If patient has no pulse and is not breathing Do Not Attempt Resuscitation   If patient has  a pulse and/or is breathing: Medical Treatment Goals COMFORT MEASURES: Keep clean/warm/dry, use medication by any route; positioning, wound care and other measures to relieve pain/suffering; use oxygen, suction/manual treatment of airway obstruction for comfort; do not transfer unless for comfort needs.   Consent: Discussion documented in EHR or advanced directives reviewed      11/01/22 1534           Code Status History     Date Active Date Inactive Code Status Order ID Comments User Context   10/12/2022 1101 11/01/2022 1534 Full Code [id number]  Charise Killian, MD Inpatient   08/26/2022 2018 09/09/2022 2244 Full Code [id number]  Andris Baumann, MD ED   05/09/2022 0919 05/17/2022 0109 Full Code [id number]  Lorretta Harp, MD ED   03/20/2022 0121 04/02/2022 0126 Full Code [id number]  Mansy, Vernetta Honey, MD ED   01/29/2020 2038 01/30/2020 2215 Full Code [id number]  Lucile Shutters, MD Inpatient   01/29/2020 1210 01/29/2020 2038 Full Code [id number]  Lucile Shutters, MD Inpatient   10/21/2019 0902 10/25/2019 2211 Full Code [id number]  Lorretta Harp, MD ED   07/30/2019 2224 08/11/2019 2130 Full Code [id number]  Mansy, Vernetta Honey, MD ED   02/13/2019 1329 02/16/2019 1849 Full Code [id number]  Jimmye Norman, NP ED   12/31/2018 1159 01/02/2019 1330 Full Code [id number]  Jimmye Norman, NP ED   05/29/2018  1541 05/30/2018 2041 Full Code [id number]  Alford Highland, MD ED   03/14/2018 0141 03/15/2018 0336 Full Code [id number]  Cammy Copa, MD Inpatient   03/08/2018 0005 03/09/2018 2108 Full Code [id number]  Altamese Dilling, MD Inpatient   11/15/2017 0017 11/17/2017 1809 Full Code [id number]  Oralia Manis, MD ED   11/12/2017 2219 11/13/2017 1935 Full Code [id number]  Oralia Manis, MD ED   04/14/2016 1330 04/14/2016 1700 Full Code [id number]  Adrian Saran, MD ED   08/08/2015 2257 08/09/2015 2054 Full Code [id number]  Hower, Cletis Athens, MD ED   04/17/2015 1334 04/17/2015 1836 Full Code [id number]  Dew, Marlow Baars, MD Inpatient      Advance  Directive Documentation    Flowsheet Row Most Recent Value  Type of Advance Directive Healthcare Power of Attorney  Pre-existing out of facility DNR order (yellow form or pink MOST form) --  "MOST" Form in Place? --       Prognosis: Poor overall   Care plan was discussed with attending who was present in meeting and  Thank you for allowing the Palliative Medicine Team to assist in the care of this patient.    Morton Stall, NP  Please contact Palliative Medicine Team phone at [phone] for questions and concerns.

## 2022-11-15 NOTE — Progress Notes (Signed)
Progress Note   [name] ZOX:[id number] DOB: 1950/08/22 DOA: 10/12/2022     34 DOS: the patient was seen and examined on 11/15/2022    Brief hospital course:   Cynthia Dean is a 72 y/o F w/ PMH of ESRD on HD, COPD, DM2, HTN, depression, CVA who presented w/ back pain x 3 weeks.  Pt presented w/ back pain and was found to intradiscal abscess/lumbar discitis/b/l psoas abscess on MRI status post IR guided drainage. Patient has had waxing and waning mental status with intermittently refusing dialysis. Patient's family have decided to continue hemodialysis after speaking with their PCP.  Hospital course prolonged, complicated by patient's inability to tolerate sitting in recliner chair for dialysis.  She only agrees to dialysis in the bed.  This is a barrier to discharge as no outpatient dialysis centers can accommodate.          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  Wound cx growing staph capitis.  ID consulted.  Abx de-escalated to cefazolin.  Not a good surgical candidate as per neuro surg.   Pt having severe low back and bilateral leg pain --cont cefazolin, for 6 weeks, End Date: 12/02/22 --pain management per orders -- palliative care following --transitioned from fentanyl patch + IV PRN >>> scheduled + PRN Norco on 4/17, since staff have not been using PRN fentanyl doses apparently for concern of side effects and/or hypotension.  Pain has remained uncontrolled, patient cannot tolerate any position except laying nearly flat in the bed     Confusion and agitation Hospital delirium --Pt was agitated, and also not responding to questions or commands during early hospitalization.  Mental status did start to improve, and now pt is calm and interactive, however,  at this time she has been having waxing and waning mental status --Delirium precautions --cont seroquel 50 mg nightly (new) to help with delirium and sleep.     ESRD: on HD MWF.  Pt refused HD on 10/17/22, 10/18/22 and 3/27, 4/1.   Since then, has been agreeable to dialysis without further issues, getting dialysis in hospital bed. When attempts made to do HD in chair, she did not tolerate on account of severe back pain --Recommend consideration for home hemodialysis if no facility capable to do HD in the bed 4/18 --HD access / fistula clotted off during dialysis 4/19 --pt taken for procedure to address fistula malfunction, but was agitated and combative, resfused procedure.   4/20 --due to dialysis, no access at this time.  Afternoon - vascular placed R femoral temp HD cath placed  Dialysis resumed.  Vascular plans for PermCath placement tomorrow 4/24 if pt is agreeable.    Goals of Care -- remain full scope 11/15/22 -- I met at bedside with Palliative Care NP and patient's daughter Cynthia Dean and two granddaughters.  Pt is again stating she does not want to continue with dialysis any longer.  She understands she would pass away if dialysis were stopped.  Family present are understanding of the complexity of the situation and are supportive of patient's wishes.  Will attempt to reach her daughter/POA Cynthia Dean this afternoon to discuss current status and patient's wishes.     4/10 per Dr. Meriam Sprague: I understand goals of care were discussed on 11/01/2022 with patient as well  as patient's family at bedside and they had understanding that there was no other option for hemodialysis without being able to sit in the recliner and so they went with the option of hospice.  Patient's family had discussion with patient's PCP who made him aware that since her low back pain is due to acute findings of discitis of L4-L5 vertebra and have not had a chance of adequate treatment of this acute discitis to determine whether  her back pain will improve to give her a chance of continuation of hemodialysis, they believe decision for hospice was premature.  I personally spoke with the family as well as patient's PCP.  We are looking at options of getting patient accepted to a hemodialysis capable facility since patient is unable to go to any facility outside those with dialysis capability.  I spoke with patient today and both patient and the family expressed interest to continue hemodialysis.     Generalized weakness / Physical Debility:  PT recs SNF. TOC following. Pt is bedridden and very high risk for developing pressure injuries of skin -- reposition frequently.   Monitor pressures areas on skin and offload as possible.   Severe back and bilateral leg pain due to severe multi-level lumbar degenerative disc disease. Reviewed Lumbar MRI findings with family.  Pt has bulging discs at L2-3, L3-4, L4-5 and L5-S1. Pt will continue to have back and leg pain once antibiotics are complete. --Pain mgmt per orders  --Pain control complicated by AMS and hypotension with escalation  ACD:  likely secondary ESRD. H&H are stable    Hypokalemia:  4/8: K 3.6 --monitor and replete PRN    Peripheral neuropathy --  4/4 - Cautious trial of gabapentin 100 mg daily (ESRD - monitor closely for side effects) 4% - Pt notes significant improvement and no side effects so far --Continue low dose gabapentin and monitor   Hx of seizures:  continue on home dose of Keppra   HLD:  continue on statin    Hx of COPD: w/o exacerbation.  Continue on bronchodilators    DM2: well controlled HbA1c 5.2.  --d/c'ed BG checks    HTN:  --Toprol XL resumed --Continue holding losartan    GERD:  continue on PPI    Depression: severity unknown.  Continue on home dose of sertraline     Overweight: BMI 28.2.  Wt loss efforts recommended   Right foot pain - see peripheral neuropathy     DVT prophylaxis: Heparin SQ Code Status: Full  code    Subjective / Interval history:  Patient was sleeping when seen this AM.  I returned in the afternoon, pt awake visiting with one of her daughters and two granddaughters.  Pt was telling them and Palliative NP at bedside that she does not want to continue dialysis.          Family Communication: Daughter Cynthia Dean and two granddaughters at bedside this afternoon. Palliative Care also in communications with daughter/HPOA, Cynthia Dean.  11/15/22 - POA daughter Cynthia Dean states no other family member besides herself and patient's s/o [name] should be given any updates on status or plan of care going forward     Consultants - Nephrology, Infectious Disease, Palliative Care   Level of care: Telemetry Medical   Dispo:   The patient is from: home Anticipated d/c is to: SNF  Anticipated d/c date is: TBD pending GOC decisions        Physical Exam:   General exam: awake alert, no acute distress Respiratory  system: on room air, normal respiratory effort. Cardiovascular system: RRR, no peripheral LE edema Gastrointestinal system: soft, NT, ND  Central nervous system: normal speech no gross focal neurologic deficits Extremities:  LUE fistula noted, R femoral temp dialysis catheter in place Skin: dry, intact, normal temperature Psychiatry: normal mood, congruent affect    Vitals:   11/14/22 1647 11/14/22 1941 11/15/22 0438 11/15/22 0831  BP: 106/63 121/61 127/71 (!) 114/32  Pulse: 84 83 82 79  Resp: 16 19 18 20   Temp: 98.4 F (36.9 C) 98.7 F (37.1 C) 98.7 F (37.1 C) 97.7 F (36.5 C)  TempSrc:  Oral    SpO2: 99% 92% 93% 99%  Weight:      Height:          Time spent: 55 minutes including time at bedside and in coordination of care.  Author: Pennie Banter, DO 11/15/2022 12:13 PM  For on call review [email or web address].

## 2022-11-16 ENCOUNTER — Encounter: Payer: Self-pay | Admitting: Vascular Surgery

## 2022-11-16 ENCOUNTER — Encounter
Admission: EM | Disposition: A | Discharge status: Hospice | Payer: Self-pay | Source: Home / Self Care | Attending: Internal Medicine

## 2022-11-16 DIAGNOSIS — M4646 Discitis, unspecified, lumbar region: Secondary | ICD-10-CM | POA: Diagnosis not present

## 2022-11-16 DIAGNOSIS — N186 End stage renal disease: Secondary | ICD-10-CM | POA: Diagnosis not present

## 2022-11-16 DIAGNOSIS — Z7189 Other specified counseling: Secondary | ICD-10-CM | POA: Diagnosis not present

## 2022-11-16 DIAGNOSIS — Z992 Dependence on renal dialysis: Secondary | ICD-10-CM | POA: Diagnosis not present

## 2022-11-16 DIAGNOSIS — T82868A Thrombosis of vascular prosthetic devices, implants and grafts, initial encounter: Secondary | ICD-10-CM | POA: Diagnosis not present

## 2022-11-16 HISTORY — PX: DIALYSIS/PERMA CATHETER INSERTION: CATH118288

## 2022-11-16 LAB — RENAL FUNCTION PANEL
Albumin: 2.4 g/dL — ABNORMAL LOW (ref 3.5–5.0)
Anion gap: 12 (ref 5–15)
BUN: 40 mg/dL — ABNORMAL HIGH (ref 8–23)
CO2: 27 mmol/L (ref 22–32)
Calcium: 9.9 mg/dL (ref 8.9–10.3)
Chloride: 97 mmol/L — ABNORMAL LOW (ref 98–111)
Creatinine, Ser: 7.49 mg/dL — ABNORMAL HIGH (ref 0.44–1.00)
GFR, Estimated: 5 mL/min — ABNORMAL LOW (ref >60)
Glucose, Bld: 161 mg/dL — ABNORMAL HIGH (ref 70–99)
Phosphorus: 5.9 mg/dL — ABNORMAL HIGH (ref 2.5–4.6)
Potassium: 4.3 mmol/L (ref 3.5–5.1)
Sodium: 136 mmol/L (ref 135–145)

## 2022-11-16 LAB — CBC
HCT: 31.5 % — ABNORMAL LOW (ref 36.0–46.0)
Hemoglobin: 9.6 g/dL — ABNORMAL LOW (ref 12.0–15.0)
MCH: 25.1 pg — ABNORMAL LOW (ref 26.0–34.0)
MCHC: 30.5 g/dL (ref 30.0–36.0)
MCV: 82.5 fL (ref 80.0–100.0)
Platelets: 164 10*3/uL (ref 150–400)
RBC: 3.82 MIL/uL — ABNORMAL LOW (ref 3.87–5.11)
RDW: 20.2 % — ABNORMAL HIGH (ref 11.5–15.5)
WBC: 6.4 10*3/uL (ref 4.0–10.5)
nRBC: 0 % (ref 0.0–0.2)

## 2022-11-16 SURGERY — DIALYSIS/PERMA CATHETER INSERTION
Anesthesia: Moderate Sedation

## 2022-11-16 SURGERY — A/V FISTULAGRAM
Anesthesia: Moderate Sedation

## 2022-11-16 MED ORDER — MIDAZOLAM HCL 2 MG/2ML IJ SOLN
INTRAMUSCULAR | Status: AC
  Filled 2022-11-16: qty 2

## 2022-11-16 MED ORDER — HEPARIN SODIUM (PORCINE) 10000 UNIT/ML IJ SOLN
INTRAMUSCULAR | Status: AC
  Filled 2022-11-16: qty 1

## 2022-11-16 MED ORDER — SODIUM CHLORIDE 0.9 % IV SOLN: INTRAVENOUS | Status: DC

## 2022-11-16 MED ORDER — MIDAZOLAM HCL 2 MG/2ML IJ SOLN
INTRAMUSCULAR | Status: DC | PRN
  Administered 2022-11-16 (×2): 1 mg via INTRAVENOUS

## 2022-11-16 MED ORDER — CEFAZOLIN SODIUM-DEXTROSE 1-4 GM/50ML-% IV SOLN
1.0000 g | INTRAVENOUS | Status: AC
  Administered 2022-11-16: 1 g via INTRAVENOUS

## 2022-11-16 MED ORDER — CEFAZOLIN SODIUM-DEXTROSE 1-4 GM/50ML-% IV SOLN
INTRAVENOUS | Status: AC
  Filled 2022-11-16: qty 50

## 2022-11-16 MED ORDER — HEPARIN SODIUM (PORCINE) 1000 UNIT/ML IJ SOLN
INTRAMUSCULAR | Status: AC
  Filled 2022-11-16: qty 10

## 2022-11-16 MED ORDER — HEPARIN SOD (PORK) LOCK FLUSH 100 UNIT/ML IV SOLN
500.0000 [IU] | Freq: Once | INTRAVENOUS | Status: AC
  Administered 2022-11-16: 500 [IU] via INTRAVENOUS
  Filled 2022-11-16: qty 50

## 2022-11-16 MED ORDER — FENTANYL CITRATE (PF) 100 MCG/2ML IJ SOLN
INTRAMUSCULAR | Status: DC | PRN
  Administered 2022-11-16 (×2): 25 ug via INTRAVENOUS

## 2022-11-16 MED ORDER — FENTANYL CITRATE PF 50 MCG/ML IJ SOSY
PREFILLED_SYRINGE | INTRAMUSCULAR | Status: AC
  Filled 2022-11-16: qty 1

## 2022-11-16 MED ORDER — HEPARIN SOD (PORK) LOCK FLUSH 100 UNIT/ML IV SOLN
INTRAVENOUS | Status: AC
  Administered 2022-11-16: 500 [IU]
  Filled 2022-11-16: qty 5

## 2022-11-16 SURGICAL SUPPLY — 6 items
ADH SKN CLS APL DERMABOND .7 (GAUZE/BANDAGES/DRESSINGS) ×1
CATH CANNON HEMO 15FR 23CM (HEMODIALYSIS SUPPLIES) IMPLANT
DERMABOND ADVANCED .7 DNX12 (GAUZE/BANDAGES/DRESSINGS) IMPLANT
PACK ANGIOGRAPHY (CUSTOM PROCEDURE TRAY) IMPLANT
SUT MNCRL AB 4-0 PS2 18 (SUTURE) IMPLANT
SUT PROLENE 0 CT 1 30 (SUTURE) IMPLANT

## 2022-11-16 NOTE — Progress Notes (Signed)
I spoke with Cynthia Dean the patients daughter this morning and Health Care Power of Attorney and she informs me and endorses patient will need to be cared for regarding all health care needs and this includes dialysis. Cynthia Dean has given Verbal consent again today to proceed with her mother , Cynthia Dean the patient in having an A/V Fistula gram with dialysis Perma Catheter placed for continued dialysis.

## 2022-11-16 NOTE — H&P (View-Only) (Signed)
I spoke with [name] the patients daughter this morning and Health Care Power of Attorney and she informs me and endorses patient will need to be cared for regarding all health care needs and this includes dialysis. Kimberly has given Verbal consent again today to proceed with her mother , Cynthia Dean the patient in having an A/V Fistula gram with dialysis Perma Catheter placed for continued dialysis.   

## 2022-11-16 NOTE — Op Note (Signed)
OPERATIVE NOTE    PRE-OPERATIVE DIAGNOSIS: 1. ESRD   POST-OPERATIVE DIAGNOSIS: same as above  PROCEDURE: Ultrasound guidance for vascular access to the left internal jugular vein Fluoroscopic guidance for placement of catheter Placement of a 3 cm tip to cuff tunneled hemodialysis catheter via the left internal jugular vein  SURGEON: Festus Barren, MD  ANESTHESIA:  Local with Moderate conscious sedation for approximately 30 minutes using 2 mg of Versed and 50 mcg of Fentanyl  ESTIMATED BLOOD LOSS: 5 cc  FLUORO TIME: less than one minute  CONTRAST: none  FINDING(S): 1.  Patent left internal jugular vein  SPECIMEN(S):  None  INDICATIONS:   Cynthia Dean is a 72 y.o. female who presents with renal failure and a clotted AVG.  The patient needs long term dialysis access for their ESRD, and a Permcath is necessary.  Risks and benefits are discussed and informed consent is obtained.    DESCRIPTION: After obtaining full informed written consent, the patient was brought back to the vascular suited. The patient's left neck and chest were sterilely prepped and draped in a sterile surgical field was created. Moderate conscious sedation was administered during a face to face encounter with the patient throughout the procedure with my supervision of the RN administering medicines and monitoring the patient's vital signs, pulse oximetry, telemetry and mental status throughout from the start of the procedure until the patient was taken to the recovery room.  The left internal jugular vein was visualized with ultrasound and found to be patent. It was then accessed under direct ultrasound guidance and a permanent image was recorded. A wire was placed. After skin nick and dilatation, the peel-away sheath was placed over the wire. I then turned my attention to an area under the clavicle. Approximately 1-2 fingerbreadths below the clavicle a small counterincision was created and tunneled from the  subclavicular incision to the access site. Using fluoroscopic guidance, a 23 centimeter tip to cuff tunneled hemodialysis catheter was selected, and tunneled from the subclavicular incision to the access site. It was then placed through the peel-away sheath and the peel-away sheath was removed. Using fluoroscopic guidance the catheter tips were parked in the right atrium. The appropriate distal connectors were placed. It withdrew blood well and flushed easily with heparinized saline and a concentrated heparin solution was then placed. It was secured to the chest wall with 2 Prolene sutures. The access incision was closed single 4-0 Monocryl. A 4-0 Monocryl pursestring suture was placed around the exit site. Sterile dressings were placed. The patient tolerated the procedure well and was taken to the recovery room in stable condition.  COMPLICATIONS: None  CONDITION: Stable  Festus Barren  11/16/2022, 12:35 PM   This note was created with Dragon Medical transcription system. Any errors in dictation are purely unintentional.

## 2022-11-16 NOTE — Progress Notes (Addendum)
Hemodialysis note  Received patient in bed to unit. Alert and oriented.  Informed consent signed and in chart.  Treatment initiated: 1508 Treatment completed: 1854  Patient tolerated well. Transported back to room, alert without acute distress.  Report given to patient's RN.   Access used: Left Chest HD PermCath Access issues: Multiple venous and arterial alarms. Had to switch to her  Right Femoral HD catheter after two hours.  Total UF removed: 500 ml Medication(s) given:  Ancef IV Post HD weight: 89.4 kg   Wolfgang Phoenix [facility]

## 2022-11-16 NOTE — Interval H&P Note (Signed)
History and Physical Interval Note:  11/16/2022 11:47 AM  Cynthia Dean  has presented today for surgery, with the diagnosis of ESRD.  The various methods of treatment have been discussed with the patient and family. After consideration of risks, benefits and other options for treatment, the patient has consented to  Procedure(s): DIALYSIS/PERMA CATHETER INSERTION (N/A) as a surgical intervention.  The patient's history has been reviewed, patient examined, no change in status, stable for surgery.  I have reviewed the patient's chart and labs.  Questions were answered to the patient's satisfaction.     Festus Barren

## 2022-11-16 NOTE — Progress Notes (Signed)
[facility]  ROUNDING NOTE   Subjective:   Cynthia Dean is a 72 y.o. 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 She presents to the ED with worsening lower extremity pain. Patient will be admitted for Back pain [M54.9] Bilateral leg pain [M79.604, M79.605]  Patient seen resting quietly Female friend at bedside Somnolent today No edema noted Room air Npo for vascular procedure   Objective:  Vital signs in last 24 hours:  Temp:  [97.7 F (36.5 C)-98.3 F (36.8 C)] 97.9 F (36.6 C) (04/24 0738) Pulse Rate:  [78-86] 80 (04/24 0738) Resp:  [16-19] 16 (04/24 0738) BP: (114-132)/(41-64) 121/41 (04/24 0738) SpO2:  [91 %-96 %] 96 % (04/24 0738)  Weight change:  Filed Weights   11/12/22 1323 11/14/22 0926 11/14/22 1300  Weight: 90.3 kg 89.8 kg 87.9 kg    Intake/Output: I/O last 3 completed shifts: In: 240 [P.O.:240] Out: -    Intake/Output this shift:  No intake/output data recorded.  Physical Exam: General: NAD  Head: Normocephalic, atraumatic.  Moist oral mucosal membranes  Eyes: Anicteric  Lungs:  Clear to auscultation  Heart: Regular rate and rhythm  Abdomen:  Soft, nontender  Extremities: No peripheral edema.  Neurologic: Alert  Skin: No lesions  Access: Left AVG (no bruit/thrill)), right femoral temp cath placed on 11/11/2022    Basic Metabolic Panel: Recent Labs  Lab 11/10/22 1830 11/12/22 0912  NA 135 138  K 4.4 3.9  CL 98 101  CO2 25 23  GLUCOSE 177* 199*  BUN 51* 65*  CREATININE 9.28* 11.16*  CALCIUM 9.5 9.5  PHOS 6.0* 7.1*     Liver Function Tests: Recent Labs  Lab 11/10/22 1830 11/12/22 0912  ALBUMIN 2.6* 2.4*    No results for input(s): "LIPASE", "AMYLASE" in the last 168 hours. No results for input(s): "AMMONIA" in the last 168 hours.  CBC: Recent Labs  Lab 11/10/22 0453  11/11/22 0500 11/12/22 0912  WBC 7.1 8.3 7.5  NEUTROABS 3.7 4.6  --   HGB 10.0* 9.1* 9.0*  HCT 33.4* 30.5* 30.8*  MCV 82.1 82.0 82.6  PLT 136* 129* 156     Cardiac Enzymes: No results for input(s): "CKTOTAL", "CKMB", "CKMBINDEX", "TROPONINI" in the last 168 hours.  BNP: Invalid input(s): "POCBNP"  CBG: No results for input(s): "GLUCAP" in the last 168 hours.   Microbiology: Results for orders placed or performed during the hospital encounter of 10/12/22  Blood culture (routine x 2)     Status: None   Collection Time: 10/12/22  5:40 PM   Specimen: BLOOD  Result Value Ref Range Status   Specimen Description BLOOD BLOOD RIGHT HAND [facility]  Final   Special Requests   Final    BOTTLES DRAWN AEROBIC AND ANAEROBIC Blood Culture results may not be optimal due to an inadequate volume of blood received in culture bottles   Culture   Final    NO GROWTH 5 DAYS Performed at [facility], [address] [place], [place] [address]    Report Status 10/17/2022 FINAL  Final  Blood culture (routine x 2)     Status: None   Collection Time: 10/12/22  6:56 PM   Specimen: BLOOD  Result Value Ref Range Status   Specimen Description BLOOD BLOOD RIGHT FOREARM  Final   Special Requests   Final    BOTTLES DRAWN AEROBIC ONLY Blood Culture results may not be  optimal due to an inadequate volume of blood received in culture bottles   Culture   Final    NO GROWTH 5 DAYS Performed at [facility], [address] [place] [place], [place] [address]    Report Status 10/17/2022 FINAL  Final  Aerobic/Anaerobic Culture w Gram Stain (surgical/deep wound)     Status: None   Collection Time: 10/13/22 11:44 AM   Specimen: Abscess  Result Value Ref Range Status   Specimen Description   Final    ABSCESS Performed at [facility], [address] [place], [place] [address]    Special Requests   Final    MUSCLE ABSCESS Performed at [facility], [address]  [place], [place] [address]    Gram Stain   Final    ABUNDANT WBC PRESENT, PREDOMINANTLY PMN NO ORGANISMS SEEN    Culture   Final    FEW STAPHYLOCOCCUS CAPITIS NO ANAEROBES ISOLATED Performed at [facility], [address], [place], [place] [address]    Report Status 10/18/2022 FINAL  Final   Organism ID, Bacteria STAPHYLOCOCCUS CAPITIS  Final      Susceptibility   Staphylococcus capitis - MIC*    CIPROFLOXACIN <=0.5 SENSITIVE Sensitive     ERYTHROMYCIN >=8 RESISTANT Resistant     GENTAMICIN <=0.5 SENSITIVE Sensitive     OXACILLIN <=0.25 SENSITIVE Sensitive     TETRACYCLINE <=1 SENSITIVE Sensitive     VANCOMYCIN 1 SENSITIVE Sensitive     TRIMETH/SULFA <=10 SENSITIVE Sensitive     CLINDAMYCIN INTERMEDIATE Intermediate     RIFAMPIN <=0.5 SENSITIVE Sensitive     Inducible Clindamycin NEGATIVE Sensitive     * FEW STAPHYLOCOCCUS CAPITIS    Coagulation Studies: No results for input(s): "LABPROT", "INR" in the last 72 hours.  Urinalysis: No results for input(s): "COLORURINE", "LABSPEC", "PHURINE", "GLUCOSEU", "HGBUR", "BILIRUBINUR", "KETONESUR", "PROTEINUR", "UROBILINOGEN", "NITRITE", "LEUKOCYTESUR" in the last 72 hours.  Invalid input(s): "APPERANCEUR"    Imaging: No results found.   Medications:    anticoagulant sodium citrate      ceFAZolin (ANCEF) IV 2 g (11/14/22 1845)    ceFAZolin (ANCEF) IV     [START ON 11/18/2022]  ceFAZolin (ANCEF) IV     methocarbamol (ROBAXIN) IV Stopped (11/08/22 1455)    (feeding supplement) PROSource Plus  30 mL Oral BID BM   Chlorhexidine Gluconate Cloth  6 each Topical Q0600   cinacalcet  30 mg Oral Q supper   epoetin (EPOGEN/PROCRIT) injection  4,000 Units Intravenous Q M,W,F-HD   feeding supplement (NEPRO CARB STEADY)  237 mL Oral TID BM   fentaNYL  1 patch Transdermal Q72H   gabapentin  100 mg Oral Once per day on Mon Wed Fri   Gerhardt's butt cream   Topical QID   levETIRAcetam  1,000 mg Oral Daily   levETIRAcetam  250 mg Oral Q  M,W,F   metoprolol succinate  100 mg Oral Daily   pantoprazole  20 mg Oral BID   psyllium  1 packet Oral Daily   QUEtiapine  25 mg Oral QHS   sertraline  25 mg Oral Daily   sodium chloride flush  10-40 mL Intracatheter Q12H   umeclidinium-vilanterol  1 puff Inhalation Daily   alteplase, anticoagulant sodium citrate, fentaNYL (SUBLIMAZE) injection, hydrALAZINE, lidocaine (PF), lidocaine-prilocaine, LORazepam, methocarbamol (ROBAXIN) IV, ondansetron, senna-docusate, sodium chloride flush  Assessment/ Plan:  [name] is a 72 y.o.  female with end stage renal disease on hemodialysis, coronary artery disease, COPD with intermittent  home O2, hypertension, hyperlipidemia, stroke, diabetes mellitus type II, and breast cancer who was admitted to [facility] on 10/12/2022 for Back pain [M54.9] Bilateral leg pain [M79.604, M79.605]  Patient was found to have vertebral osteomyelitis and psoas abscess. Cultures positive for staph capitis  [facility]  MWF Left AVG 102.5kg  End-stage renal disease on hemodialysis. Resumed on MWF schedule.    Dialysis scheduled for later today after placement of permcath. Will attempt UF 1L as tolerated. Next treatment scheduled for Friday.   2. Anemia of chronic kidney disease Normocytic Lab Results  Component Value Date   HGB 9.0 (L) 11/12/2022  Patient receives Mircera at outpatient clinic.  Continue low dose EPO.   3. Secondary Hyperparathyroidism: with outpatient labs: PTH 180 (3/11), phosphorus 5.4, calcium 9.3   Lab Results  Component Value Date   PTH 131 (H) 03/14/2018   CALCIUM 9.5 11/12/2022   CAION 1.09 (L) 08/28/2022   PHOS 7.1 (H) 11/12/2022   -Continue Cinacalcet. -Will continue to monitor bone minerals   4.  Hypotension on hemodialysis treatment. Holding home regimen of losartan, metoprolol and torsemide. Blood pressure stable  5. Diabetes mellitus type II with chronic kidney disease/renal manifestations: insulin  dependent, NPH.  Managed by primary team  6. Vertebral osteomyelitis, seen on CT lumber spine. Neurosurgery feels patient is not a surgical candidate CT guided aspiration of psoas abscess on 10/13/22. Cultures positive for Staph Capitis.   -Infectious disease recommending cefazolin with dialysis treatments. 8-week course in progress.    LOS: 35   4/24/202410:40 AM

## 2022-11-16 NOTE — Progress Notes (Signed)
1615: BP: 63/48. UF off for BP support. 200 NS bolus given. Decreased BFR to 300. Notified Dr. Wynelle Link.   1700: Switched HD on Right Femoral HD Catheter due to high pressures. Notified Dr. Wynelle Link.

## 2022-11-16 NOTE — Progress Notes (Addendum)
Daily Progress Note   Patient Name: Cynthia Dean       Date: 11/16/2022 DOB: [birth year]  Age: 72 y.o. MRN#: [id number] Attending Physician: Loyce Dys, MD Primary Care Physician: Loistine Chance, MD Admit Date: 10/12/2022  Reason for Consultation/Follow-up: Establishing goals of care  Subjective: Notes and labs reviewed. Epic chat with vascular surgery, and family has decided on pursuing dialysis access.   Patient is resting in bed. S/O [name] was at bedside. As per conversation yesterday with HPOA [name], she only wants patient's care discussed with herself or patient's significant other [name].   [name] states doctors have came in to speak with her and she said she wanted perm cath this morning for dialysis. Asked patient if she wanted to have the procedure and she said she was not sure and looked at [facility]. [name] states she needs to do what is needed to live longer. He states he does not want to hear negativity, that God will take her when he is ready, and until then, she has to keep going. He states it is his feeling that no family should be coming that is giving her doubts about continuing dialysis. Discussed that it is important to find out what the patient herself wants and then move care based on this. He discusses that she needs to keep moving forward.   [name] states he is here daily to help motivate her, as they have been together for 27 years.    Length of Stay: 35  Current Medications: Scheduled Meds:   (feeding supplement) PROSource Plus  30 mL Oral BID BM   Chlorhexidine Gluconate Cloth  6 each Topical Q0600   cinacalcet  30 mg Oral Q supper   epoetin (EPOGEN/PROCRIT) injection  4,000 Units Intravenous Q M,W,F-HD   feeding supplement (NEPRO CARB STEADY)  237 mL  Oral TID BM   fentaNYL  1 patch Transdermal Q72H   gabapentin  100 mg Oral Once per day on Mon Wed Fri   Gerhardt's butt cream   Topical QID   levETIRAcetam  1,000 mg Oral Daily   levETIRAcetam  250 mg Oral Q M,W,F   metoprolol succinate  100 mg Oral Daily   pantoprazole  20 mg Oral BID   psyllium  1 packet Oral Daily   QUEtiapine  25 mg Oral  QHS   sertraline  25 mg Oral Daily   sodium chloride flush  10-40 mL Intracatheter Q12H   umeclidinium-vilanterol  1 puff Inhalation Daily    Continuous Infusions:  anticoagulant sodium citrate      ceFAZolin (ANCEF) IV 2 g (11/14/22 1845)    ceFAZolin (ANCEF) IV     [START ON 11/18/2022]  ceFAZolin (ANCEF) IV     methocarbamol (ROBAXIN) IV Stopped (11/08/22 1455)    PRN Meds: alteplase, anticoagulant sodium citrate, fentaNYL (SUBLIMAZE) injection, hydrALAZINE, lidocaine (PF), lidocaine-prilocaine, LORazepam, methocarbamol (ROBAXIN) IV, ondansetron, senna-docusate, sodium chloride flush  Physical Exam Pulmonary:     Effort: Pulmonary effort is normal.  Neurological:     Mental Status: She is alert.             Vital Signs: BP (!) 121/41 (BP Location: Right Arm)   Pulse 80   Temp 97.9 F (36.6 C)   Resp 16   Ht 6' (1.829 m)   Wt 87.9 kg   SpO2 96%   BMI 26.28 kg/m  SpO2: SpO2: 96 % O2 Device: O2 Device: Room Air O2 Flow Rate: O2 Flow Rate (L/min): 3 L/min  Intake/output summary:  Intake/Output Summary (Last 24 hours) at 11/16/2022 1028 Last data filed at 11/15/2022 1845 Gross per 24 hour  Intake 240 ml  Output --  Net 240 ml   LBM: Last BM Date : 11/16/22 Baseline Weight: Weight: 113.4 kg Most recent weight: Weight: 87.9 kg  Patient Active Problem List   Diagnosis Date Noted   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/17/2022   Back pain 10/12/2022   Discitis of lumbar region 10/12/2022   Myositis 10/12/2022   Hemoglobin drop 08/28/2022   Chronic respiratory failure with hypoxia 08/26/2022   Acute on chronic blood loss anemia 08/26/2022    History of lower GI bleeding 08/26/2022   Insulin-requiring or dependent type II diabetes mellitus 08/26/2022   Chronic anticoagulation 08/26/2022   History of pulmonary embolism 03/20/2022 08/26/2022   Breast cancer, left S/P partial mastectomy,03/04/2022 08/26/2022   Generalized weakness    Unable to ambulate 05/09/2022   ESRD on dialysis    Bilateral leg pain    Depression    Pulmonary emboli    Obesity with body mass index (BMI) of 30.0 to 39.9    Anemia    Chronic diastolic CHF (congestive heart failure)    Seroma of breast    Bacteremia due to coagulase-negative Staphylococcus    Left breast abscess    Urinary tract infection 03/22/2022   Anemia of chronic disease 03/22/2022   Acute pulmonary embolism without acute cor pulmonale 03/20/2022   Dyslipidemia 03/20/2022   Diabetic neuropathy 03/20/2022   Sepsis due to cellulitis 03/20/2022   Delay kidney tx func d/t fluid overload requiring acute dialysis 01/29/2020   Delay kidney tx func d/t ATN and fluid overload require acute dialysis 01/29/2020   Hypoglycemia 10/21/2019   Unresponsiveness 10/21/2019   GERD (gastroesophageal reflux disease) 10/21/2019   Hypothermia    Sepsis with encephalopathy without septic shock    Diarrhea    Acute metabolic encephalopathy    Acute on chronic diastolic CHF (congestive heart failure)    Acute on chronic respiratory failure with hypoxia 07/30/2019   Rectal bleed 02/13/2019   Acute respiratory failure with hypoxia 12/31/2018   History of CVA (cerebrovascular accident) 05/29/2018   Aphasia as late effect of cerebrovascular accident (CVA) 05/01/2018   Seizure disorder 03/14/2018   Stroke 03/07/2018   Slurred speech 11/14/2017   Type 2  diabetes mellitus with ESRD (end-stage renal disease) 11/12/2017   CAD (coronary artery disease) 11/12/2017   COPD (chronic obstructive pulmonary disease) 11/12/2017   ESRD on hemodialysis 11/12/2017   CVA (cerebral vascular accident) 04/14/2016    Essential hypertension 11/27/2015   Hyperlipidemia 11/27/2015   Prolonged Q-T interval on ECG 11/26/2015   Aphasia complicating stroke 11/05/2015   Cervical spine arthritis 07/27/2015   Diabetic retinopathy without macular edema associated with type 2 diabetes mellitus 07/27/2015   Osteoarthritis of spine with radiculopathy, lumbar region 07/27/2015   Obesity, Class III, BMI 40-49.9 (morbid obesity) 07/27/2015   Vitamin D deficiency 01/21/2011   Diastolic heart failure 10/27/2010    Palliative Care Assessment & Plan   Recommendations/Plan: Continue care As per HPOA [name], as of yesterday, [name] has revised her wishes regarding family communication moving forward to only speak about patient's healthcare to [place] herself or [name] only.   Code Status:    Code Status Orders  (From admission, onward)           Start     Ordered   11/01/22 1535  Do not attempt resuscitation (DNR)  Continuous       Question Answer Comment  If patient has no pulse and is not breathing Do Not Attempt Resuscitation   If patient has a pulse and/or is breathing: Medical Treatment Goals COMFORT MEASURES: Keep clean/warm/dry, use medication by any route; positioning, wound care and other measures to relieve pain/suffering; use oxygen, suction/manual treatment of airway obstruction for comfort; do not transfer unless for comfort needs.   Consent: Discussion documented in EHR or advanced directives reviewed      11/01/22 1534           Code Status History     Date Active Date Inactive Code Status Order ID Comments User Context   10/12/2022 1101 11/01/2022 1534 Full Code [id number]  Charise Killian, MD Inpatient   08/26/2022 2018 09/09/2022 2244 Full Code [id number]  Andris Baumann, MD ED   05/09/2022 0919 05/17/2022 0109 Full Code [id number]  Lorretta Harp, MD ED   03/20/2022 0121 04/02/2022 0126 Full Code [id number]  Mansy, Vernetta Honey, MD ED   01/29/2020 2038 01/30/2020 2215 Full Code [id number]  Lucile Shutters, MD Inpatient   01/29/2020 1210 01/29/2020 2038 Full Code [id number]  Lucile Shutters, MD Inpatient   10/21/2019 0902 10/25/2019 2211 Full Code [id number]  Lorretta Harp, MD ED   07/30/2019 2224 08/11/2019 2130 Full Code [id number]  Mansy, Vernetta Honey, MD ED   02/13/2019 1329 02/16/2019 1849 Full Code [id number]  Jimmye Norman, NP ED   12/31/2018 1159 01/02/2019 1330 Full Code [id number]  Jimmye Norman, NP ED   05/29/2018 1541 05/30/2018 2041 Full Code [id number]  Alford Highland, MD ED   03/14/2018 0141 03/15/2018 0336 Full Code [id number]  Cammy Copa, MD Inpatient   03/08/2018 0005 03/09/2018 2108 Full Code [id number]  Altamese Dilling, MD Inpatient   11/15/2017 0017 11/17/2017 1809 Full Code [id number]  Oralia Manis, MD ED   11/12/2017 2219 11/13/2017 1935 Full Code [id number]  Oralia Manis, MD ED   04/14/2016 1330 04/14/2016 1700 Full Code [id number]  Adrian Saran, MD ED   08/08/2015 2257 08/09/2015 2054 Full Code [id number]  Wyatt Haste, MD ED   04/17/2015 1334 04/17/2015 1836 Full Code [id number]  Dew, Marlow Baars, MD Inpatient      Advance Directive Documentation    Flowsheet Row Most Recent Value  Type of Advance Directive Healthcare Power of Attorney  Pre-existing  out of facility DNR order (yellow form or pink MOST form) --  "MOST" Form in Place? --       Prognosis: Poor overall   Care plan was discussed with vasc surgery  Thank you for allowing the Palliative Medicine Team to assist in the care of this patient.    Morton Stall, NP  Please contact Palliative Medicine Team phone at [phone] for questions and concerns.

## 2022-11-16 NOTE — Progress Notes (Signed)
Progress Note   [name] ZOX:[id number] DOB: 14-Aug-1950 DOA: 10/12/2022     35 DOS: the patient was seen and examined on 11/16/2022    Subjective: Patient seen and examined at bedside this morning in the presence of Cynthia Dean her boyfriend boyfriend. Patient at this point tells me she is not sure whether she wants dialysis or not Cynthia Dean tells me she keeps having alternating of her mental status and had earlier today mentioned to nephrologist that she would like to continue with dialysis  Brief hospital course:   Cynthia Dean is a 72 y/o F w/ PMH of ESRD on HD, COPD, DM2, HTN, depression, CVA who presented w/ back pain x 3 weeks.  Pt presented w/ back pain and was found to intradiscal abscess/lumbar discitis/b/l psoas abscess on MRI status post IR guided drainage. Patient has had waxing and waning mental status with intermittently refusing dialysis. Patient's family have decided to continue hemodialysis after speaking with their PCP. Hospital course prolonged, complicated by patient's inability to tolerate sitting in recliner chair for dialysis.  She only agrees to dialysis in the bed.  This is a barrier to discharge as no outpatient dialysis centers can accommodate.        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  Wound cx growing staph capitis.  ID consulted.  Abx de-escalated to cefazolin.  Not a good surgical candidate as per neuro surg.   Pt having severe low back and bilateral leg pain --cont cefazolin, for 6 weeks, End Date: 12/02/22 --pain management per orders -- palliative care following --transitioned from fentanyl patch + IV PRN >>> scheduled + PRN Norco on 4/17, since staff have not been using PRN fentanyl doses apparently for concern of side effects and/or  hypotension.  Pain has remained uncontrolled, patient cannot tolerate any position except laying nearly flat in the bed     Confusion and agitation Hospital delirium --Pt was agitated, and also not responding to questions or commands during early hospitalization.  Mental status did start to improve, and now pt is calm and interactive, however, at this time she has been having waxing and waning mental status --Delirium precautions --cont seroquel 50 mg nightly to help with delirium and sleep.     ESRD: on HD MWF.  Pt refused HD on 10/17/22, 10/18/22 and 3/27, 4/1.   Since then, has been agreeable to dialysis without further issues, getting dialysis in hospital bed. When attempts made to do HD in chair, she did not tolerate on account of severe back pain --Recommend consideration for home hemodialysis if no facility capable to do HD in the bed 4/18 --HD access / fistula clotted off during dialysis 4/19 --pt taken for procedure to address fistula malfunction, but was agitated and combative, resfused procedure.   4/20 --due to dialysis, no access at this time.  Afternoon - vascular placed R femoral temp HD cath placed   Dialysis resumed.  Vascular plans for PermCath placement 4/24 if pt is agreeable.     Generalized weakness / Physical Debility:  PT recs SNF. TOC following. Pt is bedridden and very high risk for developing pressure injuries of skin -- reposition frequently.   Monitor pressures areas on skin and offload as possible.   Severe back and bilateral leg pain due to  severe multi-level lumbar degenerative disc disease. Reviewed Lumbar MRI findings with family.  Pt has bulging discs at L2-3, L3-4, L4-5 and L5-S1. Pt will continue to have back and leg pain once antibiotics are complete. --Pain mgmt per orders  --Pain control complicated by AMS and hypotension with escalation   ACD:  likely secondary ESRD. H&H are stable    Hypokalemia:  4/8: K 3.6 --monitor and replete PRN     Peripheral neuropathy --  4/4 - Cautious trial of gabapentin 100 mg daily (ESRD - monitor closely for side effects) 4% - Pt notes significant improvement and no side effects so far --Continue low dose gabapentin and monitor   Hx of seizures:  continue on home dose of Keppra   HLD:  continue on statin    Hx of COPD: w/o exacerbation.  Continue on bronchodilators    DM2: well controlled HbA1c 5.2.  --d/c'ed BG checks    HTN:  --Toprol XL resumed --Continue holding losartan    GERD:  continue on PPI    Depression: severity unknown.  Continue on home dose of sertraline     Overweight: BMI 28.2.  Wt loss efforts recommended   Right foot pain - see peripheral neuropathy     DVT prophylaxis: Heparin SQ Code Status: Full code        Family Communication: Patient's boyfriend present at bedside   11/15/22 - POA daughter Cynthia Dean states no other family member besides herself and patient's Cynthia Dean should be given any updates on status or plan of care going forward     Consultants - Nephrology, Infectious Disease, Palliative Care   Level of care: Telemetry Medical   Dispo:   The patient is from: home Anticipated d/c is to: SNF  Anticipated d/c date is: TBD pending GOC decisions      Physical Exam:   General exam: awake alert, no acute distress Respiratory system: on room air, normal respiratory effort. Cardiovascular system: RRR, no peripheral LE edema Gastrointestinal system: soft, NT, ND  Central nervous system: normal speech no gross focal neurologic deficits Extremities:  LUE fistula noted, R femoral temp dialysis catheter in place Skin: dry, intact, normal temperature Psychiatry: normal mood, congruent affect    Vitals:   11/16/22 1250 11/16/22 1300 11/16/22 1315 11/16/22 1333  BP:  (!) 142/68 (!) 146/73 (!) 145/70  Pulse: 81 79 76 77  Resp: (!) Temp:    97.8 F (36.6 C)  TempSrc:      SpO2: 98% 96% 94% 94%  Weight:      Height:          Time spent: 45 minutes  Author: Loyce Dys, MD 11/16/2022 1:35 PM  For on call review [email or web address].

## 2022-11-16 NOTE — Progress Notes (Signed)
Patient clinically stable post perm cath placement per Dr Wyn Quaker, vitals stable report given to Alvino Chapel RN/123 post procedure.

## 2022-11-17 DIAGNOSIS — M4646 Discitis, unspecified, lumbar region: Secondary | ICD-10-CM | POA: Diagnosis not present

## 2022-11-17 DIAGNOSIS — Z7189 Other specified counseling: Secondary | ICD-10-CM | POA: Diagnosis not present

## 2022-11-17 LAB — BASIC METABOLIC PANEL
Anion gap: 9 (ref 5–15)
BUN: 22 mg/dL (ref 8–23)
CO2: 27 mmol/L (ref 22–32)
Calcium: 9.3 mg/dL (ref 8.9–10.3)
Chloride: 100 mmol/L (ref 98–111)
Creatinine, Ser: 4.63 mg/dL — ABNORMAL HIGH (ref 0.44–1.00)
GFR, Estimated: 10 mL/min — ABNORMAL LOW (ref >60)
Glucose, Bld: 107 mg/dL — ABNORMAL HIGH (ref 70–99)
Potassium: 3.8 mmol/L (ref 3.5–5.1)
Sodium: 136 mmol/L (ref 135–145)

## 2022-11-17 LAB — CBC WITH DIFFERENTIAL/PLATELET
Abs Immature Granulocytes: 0.04 10*3/uL (ref 0.00–0.07)
Basophils Absolute: 0.1 10*3/uL (ref 0.0–0.1)
Basophils Relative: 1 %
Eosinophils Absolute: 0.1 10*3/uL (ref 0.0–0.5)
Eosinophils Relative: 2 %
HCT: 30.3 % — ABNORMAL LOW (ref 36.0–46.0)
Hemoglobin: 9 g/dL — ABNORMAL LOW (ref 12.0–15.0)
Immature Granulocytes: 1 %
Lymphocytes Relative: 35 %
Lymphs Abs: 2.4 10*3/uL (ref 0.7–4.0)
MCH: 24.9 pg — ABNORMAL LOW (ref 26.0–34.0)
MCHC: 29.7 g/dL — ABNORMAL LOW (ref 30.0–36.0)
MCV: 83.7 fL (ref 80.0–100.0)
Monocytes Absolute: 0.9 10*3/uL (ref 0.1–1.0)
Monocytes Relative: 14 %
Neutro Abs: 3.2 10*3/uL (ref 1.7–7.7)
Neutrophils Relative %: 47 %
Platelets: 155 10*3/uL (ref 150–400)
RBC: 3.62 MIL/uL — ABNORMAL LOW (ref 3.87–5.11)
RDW: 20.2 % — ABNORMAL HIGH (ref 11.5–15.5)
WBC: 6.8 10*3/uL (ref 4.0–10.5)
nRBC: 0 % (ref 0.0–0.2)

## 2022-11-17 MED ORDER — HEPARIN SODIUM (PORCINE) 1000 UNIT/ML DIALYSIS: 1000.0000 [IU] | INTRAMUSCULAR | Status: DC | PRN

## 2022-11-17 MED ORDER — HEPARIN SODIUM (PORCINE) 1000 UNIT/ML DIALYSIS
2000.0000 [IU] | INTRAMUSCULAR | Status: DC | PRN
  Administered 2022-11-18: 2000 [IU] via INTRAVENOUS_CENTRAL

## 2022-11-17 NOTE — Progress Notes (Addendum)
Daily Progress Note   Patient Name: Cynthia Dean       Date: 11/17/2022 DOB: 1951-05-22  Age: 72 y.o. MRN#: [id number] Attending Physician: Loyce Dys, MD Primary Care Physician: Loistine Chance, MD Admit Date: 10/12/2022  Reason for Consultation/Follow-up: Establishing goals of care  Subjective: Notes and labs reviewed. Perm cath placed and dialysis completed yesterday.   In to see patient, no family at bedside. Patient opens her eyes to voice. Questions asked of patient to attempt to assess her and engage her in conversation, including how she is doing this morning, and about her pain, or if she is hungry. She maintains eye contact but does not speak.   Length of Stay: 36  Current Medications: Scheduled Meds:   (feeding supplement) PROSource Plus  30 mL Oral BID BM   Chlorhexidine Gluconate Cloth  6 each Topical Q0600   cinacalcet  30 mg Oral Q supper   epoetin (EPOGEN/PROCRIT) injection  4,000 Units Intravenous Q M,W,F-HD   feeding supplement (NEPRO CARB STEADY)  237 mL Oral TID BM   fentaNYL  1 patch Transdermal Q72H   gabapentin  100 mg Oral Once per day on Mon Wed Fri   Gerhardt's butt cream   Topical QID   levETIRAcetam  1,000 mg Oral Daily   levETIRAcetam  250 mg Oral Q M,W,F   metoprolol succinate  100 mg Oral Daily   pantoprazole  20 mg Oral BID   psyllium  1 packet Oral Daily   QUEtiapine  25 mg Oral QHS   sertraline  25 mg Oral Daily   sodium chloride flush  10-40 mL Intracatheter Q12H   umeclidinium-vilanterol  1 puff Inhalation Daily    Continuous Infusions:  sodium chloride Stopped (11/16/22 1155)   anticoagulant sodium citrate      ceFAZolin (ANCEF) IV 2 g (11/14/22 1845)    ceFAZolin (ANCEF) IV Stopped (11/16/22 2000)   [START ON 11/18/2022]   ceFAZolin (ANCEF) IV     methocarbamol (ROBAXIN) IV Stopped (11/08/22 1455)    PRN Meds: alteplase, anticoagulant sodium citrate, fentaNYL (SUBLIMAZE) injection, heparin, heparin, hydrALAZINE, lidocaine (PF), lidocaine-prilocaine, LORazepam, methocarbamol (ROBAXIN) IV, ondansetron, senna-docusate, sodium chloride flush  Physical Exam Pulmonary:     Effort: Pulmonary effort is normal.  Neurological:     Mental Status: She is alert.  Vital Signs: BP (!) 88/42 (BP Location: Right Leg)   Pulse 78   Temp 98.2 F (36.8 C)   Resp 16   Ht 6' (1.829 m)   Wt 89.4 kg   SpO2 97%   BMI 26.73 kg/m  SpO2: SpO2: 97 % O2 Device: O2 Device: Room Air O2 Flow Rate: O2 Flow Rate (L/min): 2 L/min  Intake/output summary:  Intake/Output Summary (Last 24 hours) at 11/17/2022 1050 Last data filed at 11/16/2022 1950 Gross per 24 hour  Intake 180 ml  Output 500 ml  Net -320 ml   LBM: Last BM Date : 11/16/22 Baseline Weight: Weight: 113.4 kg Most recent weight: Weight: 89.4 kg        Patient Active Problem List   Diagnosis Date Noted   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/17/2022   Back pain 10/12/2022   Discitis of lumbar region 10/12/2022   Myositis 10/12/2022   Hemoglobin drop 08/28/2022   Chronic respiratory failure with hypoxia 08/26/2022   Acute on chronic blood loss anemia 08/26/2022   History of lower GI bleeding 08/26/2022   Insulin-requiring or dependent type II diabetes mellitus 08/26/2022   Chronic anticoagulation 08/26/2022   History of pulmonary embolism 03/20/2022 08/26/2022   Breast cancer, left S/P partial mastectomy,03/04/2022 08/26/2022   Generalized weakness    Unable to ambulate 05/09/2022   ESRD on dialysis    Bilateral leg pain    Depression    Pulmonary emboli    Obesity with body mass index (BMI) of 30.0 to 39.9    Anemia    Chronic diastolic CHF (congestive heart failure)    Seroma of breast    Bacteremia due to coagulase-negative Staphylococcus    Left breast  abscess    Urinary tract infection 03/22/2022   Anemia of chronic disease 03/22/2022   Acute pulmonary embolism without acute cor pulmonale 03/20/2022   Dyslipidemia 03/20/2022   Diabetic neuropathy 03/20/2022   Sepsis due to cellulitis 03/20/2022   Delay kidney tx func d/t fluid overload requiring acute dialysis 01/29/2020   Delay kidney tx func d/t ATN and fluid overload require acute dialysis 01/29/2020   Hypoglycemia 10/21/2019   Unresponsiveness 10/21/2019   GERD (gastroesophageal reflux disease) 10/21/2019   Hypothermia    Sepsis with encephalopathy without septic shock    Diarrhea    Acute metabolic encephalopathy    Acute on chronic diastolic CHF (congestive heart failure)    Acute on chronic respiratory failure with hypoxia 07/30/2019   Rectal bleed 02/13/2019   Acute respiratory failure with hypoxia 12/31/2018   History of CVA (cerebrovascular accident) 05/29/2018   Aphasia as late effect of cerebrovascular accident (CVA) 05/01/2018   Seizure disorder 03/14/2018   Stroke 03/07/2018   Slurred speech 11/14/2017   Type 2 diabetes mellitus with ESRD (end-stage renal disease) 11/12/2017   CAD (coronary artery disease) 11/12/2017   COPD (chronic obstructive pulmonary disease) 11/12/2017   ESRD on hemodialysis 11/12/2017   CVA (cerebral vascular accident) 04/14/2016   Essential hypertension 11/27/2015   Hyperlipidemia 11/27/2015   Prolonged Q-T interval on ECG 11/26/2015   Aphasia complicating stroke 11/05/2015   Cervical spine arthritis 07/27/2015   Diabetic retinopathy without macular edema associated with type 2 diabetes mellitus 07/27/2015   Osteoarthritis of spine with radiculopathy, lumbar region 07/27/2015   Obesity, Class III, BMI 40-49.9 (morbid obesity) 07/27/2015   Vitamin D deficiency 01/21/2011   Diastolic heart failure 10/27/2010    Palliative Care Assessment & Plan     Recommendations/Plan: Continue current care.  Code Status:    Code Status  Orders  (From admission, onward)           Start     Ordered   11/01/22 1535  Do not attempt resuscitation (DNR)  Continuous       Question Answer Comment  If patient has no pulse and is not breathing Do Not Attempt Resuscitation   If patient has a pulse and/or is breathing: Medical Treatment Goals COMFORT MEASURES: Keep clean/warm/dry, use medication by any route; positioning, wound care and other measures to relieve pain/suffering; use oxygen, suction/manual treatment of airway obstruction for comfort; do not transfer unless for comfort needs.   Consent: Discussion documented in EHR or advanced directives reviewed      11/01/22 1534           Code Status History     Date Active Date Inactive Code Status Order ID Comments User Context   10/12/2022 1101 11/01/2022 1534 Full Code [id number]  Charise Killian, MD Inpatient   08/26/2022 2018 09/09/2022 2244 Full Code [id number]  Andris Baumann, MD ED   05/09/2022 0919 05/17/2022 0109 Full Code [id number]  Lorretta Harp, MD ED   03/20/2022 0121 04/02/2022 0126 Full Code [id number]  Mansy, Vernetta Honey, MD ED   01/29/2020 2038 01/30/2020 2215 Full Code [id number]  Lucile Shutters, MD Inpatient   01/29/2020 1210 01/29/2020 2038 Full Code [id number]  Lucile Shutters, MD Inpatient   10/21/2019 0902 10/25/2019 2211 Full Code [id number]  Lorretta Harp, MD ED   07/30/2019 2224 08/11/2019 2130 Full Code [id number]  Mansy, Vernetta Honey, MD ED   02/13/2019 1329 02/16/2019 1849 Full Code [id number]  Jimmye Norman, NP ED   12/31/2018 1159 01/02/2019 1330 Full Code [id number]  Jimmye Norman, NP ED   05/29/2018 1541 05/30/2018 2041 Full Code [id number]  Alford Highland, MD ED   03/14/2018 0141 03/15/2018 0336 Full Code [id number]  Cammy Copa, MD Inpatient   03/08/2018 0005 03/09/2018 2108 Full Code [id number]  Altamese Dilling, MD Inpatient   11/15/2017 0017 11/17/2017 1809 Full Code [id number]  Oralia Manis, MD ED   11/12/2017 2219 11/13/2017 1935 Full Code [id number]  Oralia Manis, MD ED   04/14/2016 1330 04/14/2016 1700 Full Code [id number]  Adrian Saran, MD ED   08/08/2015 2257 08/09/2015 2054 Full Code [id number]  Wyatt Haste, MD ED   04/17/2015 1334 04/17/2015 1836 Full Code [id number]  Annice Needy, MD Inpatient      Advance Directive Documentation    Flowsheet Row Most Recent Value  Type of Advance Directive Healthcare Power of Attorney  Pre-existing out of facility DNR order (yellow form or pink MOST form) --  "MOST" Form in Place? --       Prognosis:  Poor overall   Thank you for allowing the Palliative Medicine Team to assist in the care of this patient.    Morton Stall, NP  Please contact Palliative Medicine Team phone at [phone] for questions and concerns.

## 2022-11-17 NOTE — Progress Notes (Addendum)
Mobility Specialist - Progress Note  Pre-mobility: BP 129/61 (83)   11/17/22 1200  Mobility  Activity Dangled on edge of bed (partially sat up in bed, supine leg raises and abductors)  Level of Assistance +2 (takes two people)  Assistive Device None  Distance Ambulated (ft) 0 ft  Range of Motion/Exercises Right leg;Left leg (active-assistive)  Activity Response Tolerated poorly  $Mobility charge 1 Mobility   Pt supine upon entry, utilizing RA. Pt initially agreeable to transfer EOB-- delayed response when asked. Pt required active-assistive manual movement of BLE when completing one bilat leg raise-- RLE requiring more assistance. Pt completed bed mob MaxA +2-- for trunk and BLE, to transfer EOB. Upon transferring to EOB Pt began to yell out "stop that hurts", when asked to expressed site of pain Pt stated both legs and back. Pt sat EOB for under one min before returning supine. Once supine, MS attempted to sit Pt up in bed-- required max support of trunk to partially sit up due to pain in abd area, pt returned supine. Pt educated multiple times of the importance of mobility and movement throughout the session. Pt left supine with alarm set and needs within reach. Cousin present a bedside.   Zetta Bills Mobility Specialist 11/17/22 12:20 PM

## 2022-11-17 NOTE — Progress Notes (Signed)
Progress Note   [name] WUJ:[id number] DOB: Dec 14, 1950 DOA: 10/12/2022     36 DOS: the patient was seen and examined on 11/17/2022    Subjective: Patient seen and examined at bedside this morning in the presence of her cousin  Denied worsening abdominal pain chest pain nausea vomiting or cough   brief hospital course:   Cynthia Dean is a 72 y/o F w/ PMH of ESRD on HD, COPD, DM2, HTN, depression, CVA who presented w/ back pain x 3 weeks.  Pt presented w/ back pain and was found to intradiscal abscess/lumbar discitis/b/l psoas abscess on MRI status post IR guided drainage. Patient has had waxing and waning mental status with intermittently refusing dialysis. Patient's family have decided to continue hemodialysis after speaking with their PCP. Hospital course prolonged, complicated by patient's inability to tolerate sitting in recliner chair for dialysis.  She only agrees to dialysis in the bed.  This is a barrier to discharge as no outpatient dialysis centers can accommodate.         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  Wound cx growing staph capitis.  ID consulted.  Abx de-escalated to cefazolin.  Not a good surgical candidate as per neuro surg.   Pt having severe low back and bilateral leg pain --cont cefazolin, for 6 weeks, End Date: 12/02/22 --pain management per orders -- palliative care following --transitioned from fentanyl patch + IV PRN >>> scheduled + PRN Norco on 4/17, since staff have not been using PRN fentanyl doses apparently for concern of side effects and/or hypotension.  Pain has remained uncontrolled, patient cannot tolerate any position except laying nearly flat in the bed     Confusion and agitation Hospital delirium --Pt was  agitated, and also not responding to questions or commands during early hospitalization.  Mental status did start to improve, and now pt is calm and interactive, however, at this time she has been having waxing and waning mental status --Delirium precautions --cont seroquel 50 mg nightly to help with delirium and sleep.     ESRD: on HD MWF.  Pt refused HD on 10/17/22, 10/18/22 and 3/27, 4/1.   Since then, has been agreeable to dialysis without further issues, getting dialysis in hospital bed. When attempts made to do HD in chair, she did not tolerate on account of severe back pain --Recommend consideration for home hemodialysis if no facility capable to do HD in the bed 4/18 --HD access / fistula clotted off during dialysis 4/19 --pt taken for procedure to address fistula malfunction, but was agitated and combative, resfused procedure.   4/20 --due to dialysis, no access at this time.  Afternoon - vascular placed R femoral temp HD cath placed   Dialysis resumed.  Patient underwent PermCath placement 4/24      Generalized weakness / Physical Debility:  PT recs SNF. TOC following. Pt is bedridden and very high risk for developing pressure injuries of skin -- reposition frequently.   Monitor pressures areas on skin and offload as possible.   Severe back and bilateral leg pain due to severe multi-level lumbar degenerative disc disease. Reviewed Lumbar MRI findings with family.  Pt has bulging discs at L2-3, L3-4, L4-5 and L5-S1. Pt will continue to have back and leg pain once antibiotics are  complete. --Pain mgmt per orders  --Pain control complicated by AMS and hypotension with escalation   ACD:  likely secondary ESRD. H&H are stable    Hypokalemia:  4/8: K 3.6 --monitor and replete PRN    Peripheral neuropathy --  4/4 - Cautious trial of gabapentin 100 mg daily (ESRD - monitor closely for side effects) 4% - Pt notes significant improvement and no side effects so far --Continue low  dose gabapentin and monitor   Hx of seizures:  continue on home dose of Keppra   HLD:  continue on statin    Hx of COPD: w/o exacerbation.  Continue on bronchodilators    DM2: well controlled HbA1c 5.2.  --d/c'ed BG checks    HTN:  --Toprol XL resumed --Continue holding losartan    GERD:  continue on PPI    Depression: severity unknown.  Continue on home dose of sertraline     Overweight: BMI 28.2.  Wt loss efforts recommended   Right foot pain - see peripheral neuropathy     DVT prophylaxis: Heparin SQ Code Status: Full code      Family Communication: Patient's cousin present at bedside     11/15/22 - POA daughter Cynthia Dean states no other family member besides herself and patient's Cynthia Dean should be given any updates on status or plan of care going forward     Consultants - Nephrology, Infectious Disease, Palliative Care   Level of care: Telemetry Medical   Dispo:   The patient is from: home Anticipated d/c is to: SNF  Anticipated d/c date is: TBD pending GOC decisions   Physical Exam:   General exam: awake alert, no acute distress Respiratory system: on room air, normal respiratory effort. Cardiovascular system: RRR, no peripheral LE edema Gastrointestinal system: soft, NT, ND  Central nervous system: normal speech no gross focal neurologic deficits Extremities:  LUE fistula noted, R femoral temp dialysis catheter in place Skin: dry, intact, normal temperature Psychiatry: normal mood, congruent affect   Vitals:   11/16/22 1914 11/16/22 1945 11/17/22 0456 11/17/22 0807  BP:  110/66 (!) 108/57 (!) 88/42  Pulse:  80 79 78  Resp:  Temp:  97.6 F (36.4 C) 98.1 F (36.7 C) 98.2 F (36.8 C)  TempSrc:   Oral   SpO2:  98% 96% 97%  Weight: 89.4 kg     Height:        Author: Loyce Dys, MD 11/17/2022 1:31 PM  For on call review [email or web address].

## 2022-11-17 NOTE — Progress Notes (Signed)
[facility]  ROUNDING NOTE   Subjective:   Cynthia Dean is a 72 y.o. 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 She presents to the ED with worsening lower extremity pain. Patient will be admitted for Back pain [M54.9] Bilateral leg pain [M79.604, M79.605]  Patient more alert today No family present Denies pain or discomfort  Dialysis yesterday, tolerated well    Objective:  Vital signs in last 24 hours:  Temp:  [97.5 F (36.4 C)-98.2 F (36.8 C)] 98.2 F (36.8 C) (04/25 0807) Pulse Rate:  [0-86] 78 (04/25 0807) Resp:  [9-24] 16 (04/25 0807) BP: (63-154)/(28-79) 88/42 (04/25 0807) SpO2:  [88 %-100 %] 97 % (04/25 0807) Weight:  [89.4 kg-89.7 kg] 89.4 kg (04/24 1914)  Weight change:  Filed Weights   11/14/22 1300 11/16/22 1514 11/16/22 1914  Weight: 87.9 kg 89.7 kg 89.4 kg    Intake/Output: I/O last 3 completed shifts: In: 180 [P.O.:180] Out: 500 [Other:500]   Intake/Output this shift:  No intake/output data recorded.  Physical Exam: General: NAD  Head: Normocephalic, atraumatic.  Moist oral mucosal membranes  Eyes: Anicteric  Lungs:  Clear to auscultation  Heart: Regular rate and rhythm  Abdomen:  Soft, nontender  Extremities: No peripheral edema.  Neurologic: Alert  Skin: No lesions  Access: Left AVG (no bruit/thrill)), right femoral temp cath placed on 11/11/2022    Basic Metabolic Panel: Recent Labs  Lab 11/10/22 1830 11/12/22 0912 11/16/22 1508 11/17/22 0650  NA 135 138 136 136  K 4.4 3.9 4.3 3.8  CL 98 101 97* 100  CO2 GLUCOSE 177* 199* 161* 107*  BUN 51* 65* 40* 22  CREATININE 9.28* 11.16* 7.49* 4.63*  CALCIUM 9.5 9.5 9.9 9.3  PHOS 6.0* 7.1* 5.9*  --      Liver Function Tests: Recent Labs  Lab 11/10/22 1830 11/12/22 0912 11/16/22 1508  ALBUMIN 2.6* 2.4* 2.4*    No  results for input(s): "LIPASE", "AMYLASE" in the last 168 hours. No results for input(s): "AMMONIA" in the last 168 hours.  CBC: Recent Labs  Lab 11/11/22 0500 11/12/22 0912 11/16/22 1508 11/17/22 0650  WBC 8.3 7.5 6.4 6.8  NEUTROABS 4.6  --   --  3.2  HGB 9.1* 9.0* 9.6* 9.0*  HCT 30.5* 30.8* 31.5* 30.3*  MCV 82.0 82.6 82.5 83.7  PLT 129* 156 164 155     Cardiac Enzymes: No results for input(s): "CKTOTAL", "CKMB", "CKMBINDEX", "TROPONINI" in the last 168 hours.  BNP: Invalid input(s): "POCBNP"  CBG: No results for input(s): "GLUCAP" in the last 168 hours.   Microbiology: Results for orders placed or performed during the hospital encounter of 10/12/22  Blood culture (routine x 2)     Status: None   Collection Time: 10/12/22  5:40 PM   Specimen: BLOOD  Result Value Ref Range Status   Specimen Description BLOOD BLOOD RIGHT HAND [facility]  Final   Special Requests   Final    BOTTLES DRAWN AEROBIC AND ANAEROBIC Blood Culture results may not be optimal due to an inadequate volume of blood received in culture bottles   Culture   Final    NO GROWTH 5 DAYS Performed at [facility], [address], [place], [place] [address]    Report Status 10/17/2022 FINAL  Final  Blood culture (routine x 2)     Status: None   Collection Time:  10/12/22  6:56 PM   Specimen: BLOOD  Result Value Ref Range Status   Specimen Description BLOOD BLOOD RIGHT FOREARM  Final   Special Requests   Final    BOTTLES DRAWN AEROBIC ONLY Blood Culture results may not be optimal due to an inadequate volume of blood received in culture bottles   Culture   Final    NO GROWTH 5 DAYS Performed at [facility], [address], [place], [place] [address]    Report Status 10/17/2022 FINAL  Final  Aerobic/Anaerobic Culture w Gram Stain (surgical/deep wound)     Status: None   Collection Time: 10/13/22 11:44 AM   Specimen: Abscess  Result Value Ref Range Status   Specimen Description   Final     ABSCESS Performed at [facility], [address] [place], [place] [address]    Special Requests   Final    MUSCLE ABSCESS Performed at [facility], [address] [place], [place] [address]    Gram Stain   Final    ABUNDANT WBC PRESENT, PREDOMINANTLY PMN NO ORGANISMS SEEN    Culture   Final    FEW STAPHYLOCOCCUS CAPITIS NO ANAEROBES ISOLATED Performed at [facility], [address] [place], [place] [address]    Report Status 10/18/2022 FINAL  Final   Organism ID, Bacteria STAPHYLOCOCCUS CAPITIS  Final      Susceptibility   Staphylococcus capitis - MIC*    CIPROFLOXACIN <=0.5 SENSITIVE Sensitive     ERYTHROMYCIN >=8 RESISTANT Resistant     GENTAMICIN <=0.5 SENSITIVE Sensitive     OXACILLIN <=0.25 SENSITIVE Sensitive     TETRACYCLINE <=1 SENSITIVE Sensitive     VANCOMYCIN 1 SENSITIVE Sensitive     TRIMETH/SULFA <=10 SENSITIVE Sensitive     CLINDAMYCIN INTERMEDIATE Intermediate     RIFAMPIN <=0.5 SENSITIVE Sensitive     Inducible Clindamycin NEGATIVE Sensitive     * FEW STAPHYLOCOCCUS CAPITIS    Coagulation Studies: No results for input(s): "LABPROT", "INR" in the last 72 hours.  Urinalysis: No results for input(s): "COLORURINE", "LABSPEC", "PHURINE", "GLUCOSEU", "HGBUR", "BILIRUBINUR", "KETONESUR", "PROTEINUR", "UROBILINOGEN", "NITRITE", "LEUKOCYTESUR" in the last 72 hours.  Invalid input(s): "APPERANCEUR"    Imaging: PERIPHERAL VASCULAR CATHETERIZATION  Result Date: 11/16/2022 See surgical note for result.    Medications:    sodium chloride Stopped (11/16/22 1155)   anticoagulant sodium citrate      ceFAZolin (ANCEF) IV 2 g (11/14/22 1845)    ceFAZolin (ANCEF) IV Stopped (11/16/22 2000)   [START ON 11/18/2022]  ceFAZolin (ANCEF) IV     methocarbamol (ROBAXIN) IV Stopped (11/08/22 1455)    (feeding supplement) PROSource Plus  30 mL Oral BID BM   Chlorhexidine Gluconate Cloth  6 each Topical Q0600   cinacalcet  30 mg Oral Q supper    epoetin (EPOGEN/PROCRIT) injection  4,000 Units Intravenous Q M,W,F-HD   feeding supplement (NEPRO CARB STEADY)  237 mL Oral TID BM   fentaNYL  1 patch Transdermal Q72H   gabapentin  100 mg Oral Once per day on Mon Wed Fri   Gerhardt's butt cream   Topical QID   levETIRAcetam  1,000 mg Oral Daily   levETIRAcetam  250 mg Oral Q M,W,F   metoprolol succinate  100 mg Oral Daily   pantoprazole  20 mg Oral BID   psyllium  1 packet Oral Daily   QUEtiapine  25 mg Oral QHS   sertraline  25 mg Oral Daily   sodium chloride flush  10-40 mL Intracatheter Q12H   umeclidinium-vilanterol  1 puff Inhalation Daily   alteplase, anticoagulant sodium citrate, fentaNYL (SUBLIMAZE) injection, heparin, heparin, hydrALAZINE, lidocaine (PF), lidocaine-prilocaine, LORazepam, methocarbamol (ROBAXIN) IV, ondansetron, senna-docusate, sodium chloride flush  Assessment/ Plan:  [name] is a 72 y.o.  female with end stage renal disease on hemodialysis, coronary artery disease, COPD with intermittent home O2, hypertension, hyperlipidemia, stroke, diabetes mellitus type II, and breast cancer who was admitted to [facility] on 10/12/2022 for Back pain [M54.9] Bilateral leg pain [M79.604, M79.605]  Patient was found to have vertebral osteomyelitis and psoas abscess. Cultures positive for staph capitis  [facility] MWF Left AVG 102.5kg  End-stage renal disease on hemodialysis. Resumed on MWF schedule.    Dialysis yesterday, used HD temp cath due to multiple increased pressures with newly placed HD permcath. Dialysis scheduled tomorrow, will attempt to use permcath.   Concerned that PT has signed off due to lack of patient participation. Patient still needs to sit in chair for outpatient dialysis.   2. Anemia of chronic kidney disease Normocytic Lab Results  Component Value Date   HGB 9.0 (L) 11/17/2022  Patient receives Mircera at outpatient clinic.  Continue low dose EPO.   3. Secondary  Hyperparathyroidism: with outpatient labs: PTH 180 (3/11), phosphorus 5.4, calcium 9.3   Lab Results  Component Value Date   PTH 131 (H) 03/14/2018   CALCIUM 9.3 11/17/2022   CAION 1.09 (L) 08/28/2022   PHOS 5.9 (H) 11/16/2022   -Continue Cinacalcet. -Will continue to monitor bone minerals   4.  Hypotension on hemodialysis treatment. Holding home regimen of losartan, metoprolol and torsemide. Receiving metoprolol  daily. Blood pressure soft this morning, 88/42  5. Diabetes mellitus type II with chronic kidney disease/renal manifestations: insulin dependent, NPH.  Managed by primary team  6. Vertebral osteomyelitis, seen on CT lumber spine. Neurosurgery feels patient is not a surgical candidate CT guided aspiration of psoas abscess on 10/13/22. Cultures positive for Staph Capitis.   -Infectious disease recommending cefazolin with dialysis treatments. 8-week course in progress.    LOS: 36   4/25/202411:36 AM

## 2022-11-18 DIAGNOSIS — M4646 Discitis, unspecified, lumbar region: Secondary | ICD-10-CM | POA: Diagnosis not present

## 2022-11-18 LAB — CBC WITH DIFFERENTIAL/PLATELET
Abs Immature Granulocytes: 0.01 10*3/uL (ref 0.00–0.07)
Basophils Absolute: 0 10*3/uL (ref 0.0–0.1)
Basophils Relative: 1 %
Eosinophils Absolute: 0.1 10*3/uL (ref 0.0–0.5)
Eosinophils Relative: 2 %
HCT: 29 % — ABNORMAL LOW (ref 36.0–46.0)
Hemoglobin: 8.7 g/dL — ABNORMAL LOW (ref 12.0–15.0)
Immature Granulocytes: 0 %
Lymphocytes Relative: 33 %
Lymphs Abs: 2.1 10*3/uL (ref 0.7–4.0)
MCH: 24.5 pg — ABNORMAL LOW (ref 26.0–34.0)
MCHC: 30 g/dL (ref 30.0–36.0)
MCV: 81.7 fL (ref 80.0–100.0)
Monocytes Absolute: 0.8 10*3/uL (ref 0.1–1.0)
Monocytes Relative: 13 %
Neutro Abs: 3.2 10*3/uL (ref 1.7–7.7)
Neutrophils Relative %: 51 %
Platelets: 161 10*3/uL (ref 150–400)
RBC: 3.55 MIL/uL — ABNORMAL LOW (ref 3.87–5.11)
RDW: 20.2 % — ABNORMAL HIGH (ref 11.5–15.5)
WBC: 6.3 10*3/uL (ref 4.0–10.5)
nRBC: 0 % (ref 0.0–0.2)

## 2022-11-18 LAB — BASIC METABOLIC PANEL
Anion gap: 8 (ref 5–15)
BUN: 35 mg/dL — ABNORMAL HIGH (ref 8–23)
CO2: 28 mmol/L (ref 22–32)
Calcium: 9.3 mg/dL (ref 8.9–10.3)
Chloride: 99 mmol/L (ref 98–111)
Creatinine, Ser: 6.09 mg/dL — ABNORMAL HIGH (ref 0.44–1.00)
GFR, Estimated: 7 mL/min — ABNORMAL LOW (ref >60)
Glucose, Bld: 134 mg/dL — ABNORMAL HIGH (ref 70–99)
Potassium: 4 mmol/L (ref 3.5–5.1)
Sodium: 135 mmol/L (ref 135–145)

## 2022-11-18 MED ORDER — EPOETIN ALFA 4000 UNIT/ML IJ SOLN
INTRAMUSCULAR | Status: AC
  Filled 2022-11-18: qty 1

## 2022-11-18 MED ORDER — HEPARIN SODIUM (PORCINE) 1000 UNIT/ML IJ SOLN
INTRAMUSCULAR | Status: AC
  Filled 2022-11-18: qty 1

## 2022-11-18 NOTE — Progress Notes (Signed)
Received patient in bed to unit. Awake and no complaints   Informed consent signed and in chart.    TX duration:3.25     Transported By hospital transport back to room Hand-off given to patient's nurse.    Access used: L chest PC Access issues: Ports reversed for better delivery   Total UF removed: 1L Medication(s) given:Epogen,Ancef Post HD ZO:[id number]  Post HD weight: 88.3kg    [facility]

## 2022-11-18 NOTE — Progress Notes (Signed)
[facility]  ROUNDING NOTE   Subjective:   Cynthia Dean is a 72 y.o. 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 She presents to the ED with worsening lower extremity pain. Patient will be admitted for Back pain [M54.9] Bilateral leg pain [M79.604, M79.605]  Patient seen and evaluated during dialysis    HEMODIALYSIS FLOWSHEET:  Blood Flow Rate (mL/min): 350 mL/min (BFR decreased by RN) Arterial Pressure (mmHg): -170 mmHg Venous Pressure (mmHg): 170 mmHg TMP (mmHg): 5 mmHg Ultrafiltration Rate (mL/min): 0 mL/min (Uf paused due to low bp. Pt received 100 cc NS per RN request.) Dialysate Flow Rate (mL/min): 300 ml/min Dialysis Fluid Bolus: Normal Saline Bolus Amount (mL): 100 mL  HD RN reports patient complaining of pain on arrival. Appears patient was given as needed Ativan prior to HD arrival.  Objective:  Vital signs in last 24 hours:  Temp:  [98 F (36.7 C)-98.9 F (37.2 C)] 98.3 F (36.8 C) (04/26 0830) Pulse Rate:  [79-96] 85 (04/26 1000) Resp:  [12-22] 14 (04/26 1000) BP: (95-157)/(55-70) 95/55 (04/26 1000) SpO2:  [94 %-100 %] 100 % (04/26 1000) Weight:  [89.3 kg] 89.3 kg (04/26 0831)  Weight change:  Filed Weights   11/16/22 1514 11/16/22 1914 11/18/22 0831  Weight: 89.7 kg 89.4 kg 89.3 kg    Intake/Output: I/O last 3 completed shifts: In: 270 [P.O.:270] Out: -    Intake/Output this shift:  No intake/output data recorded.  Physical Exam: General: NAD  Head: Normocephalic, atraumatic.  Moist oral mucosal membranes  Eyes: Anicteric  Lungs:  Clear to auscultation  Heart: Regular rate and rhythm  Abdomen:  Soft, nontender  Extremities: No peripheral edema.  Neurologic: Alert  Skin: No lesions  Access: Left AVG (no bruit/thrill)), left chest PermCath    Basic Metabolic Panel: Recent Labs  Lab  11/12/22 0912 11/16/22 1508 11/17/22 0650 11/18/22 0445  NA 138 136 136 135  K 3.9 4.3 3.8 4.0  CL 101 97* 100 99  CO2 23 27 27 28   GLUCOSE 199* 161* 107* 134*  BUN 65* 40* 22 35*  CREATININE 11.16* 7.49* 4.63* 6.09*  CALCIUM 9.5 9.9 9.3 9.3  PHOS 7.1* 5.9*  --   --      Liver Function Tests: Recent Labs  Lab 11/12/22 0912 11/16/22 1508  ALBUMIN 2.4* 2.4*    No results for input(s): "LIPASE", "AMYLASE" in the last 168 hours. No results for input(s): "AMMONIA" in the last 168 hours.  CBC: Recent Labs  Lab 11/12/22 0912 11/16/22 1508 11/17/22 0650 11/18/22 0445  WBC 7.5 6.4 6.8 6.3  NEUTROABS  --   --  3.2 3.2  HGB 9.0* 9.6* 9.0* 8.7*  HCT 30.8* 31.5* 30.3* 29.0*  MCV 82.6 82.5 83.7 81.7  PLT 156 164 155 161     Cardiac Enzymes: No results for input(s): "CKTOTAL", "CKMB", "CKMBINDEX", "TROPONINI" in the last 168 hours.  BNP: Invalid input(s): "POCBNP"  CBG: No results for input(s): "GLUCAP" in the last 168 hours.   Microbiology: Results for orders placed or performed during the hospital encounter of 10/12/22  Blood culture (routine x 2)     Status: None   Collection Time: 10/12/22  5:40 PM   Specimen: BLOOD  Result Value Ref Range Status   Specimen Description BLOOD BLOOD RIGHT HAND [facility]  Final   Special Requests   Final    BOTTLES DRAWN AEROBIC AND ANAEROBIC  Blood Culture results may not be optimal due to an inadequate volume of blood received in culture bottles   Culture   Final    NO GROWTH 5 DAYS Performed at [facility], [address] [place], [place] [address]    Report Status 10/17/2022 FINAL  Final  Blood culture (routine x 2)     Status: None   Collection Time: 10/12/22  6:56 PM   Specimen: BLOOD  Result Value Ref Range Status   Specimen Description BLOOD BLOOD RIGHT FOREARM  Final   Special Requests   Final    BOTTLES DRAWN AEROBIC ONLY Blood Culture results may not be optimal due to an inadequate volume of blood received  in culture bottles   Culture   Final    NO GROWTH 5 DAYS Performed at [facility], [address] [place], [place] [address]    Report Status 10/17/2022 FINAL  Final  Aerobic/Anaerobic Culture w Gram Stain (surgical/deep wound)     Status: None   Collection Time: 10/13/22 11:44 AM   Specimen: Abscess  Result Value Ref Range Status   Specimen Description   Final    ABSCESS Performed at [facility], [address], [place], [place] [address]    Special Requests   Final    MUSCLE ABSCESS Performed at [facility], [address] [place], [place] [address]    Gram Stain   Final    ABUNDANT WBC PRESENT, PREDOMINANTLY PMN NO ORGANISMS SEEN    Culture   Final    FEW STAPHYLOCOCCUS CAPITIS NO ANAEROBES ISOLATED Performed at [facility], [address] [place], [place] [address]    Report Status 10/18/2022 FINAL  Final   Organism ID, Bacteria STAPHYLOCOCCUS CAPITIS  Final      Susceptibility   Staphylococcus capitis - MIC*    CIPROFLOXACIN <=0.5 SENSITIVE Sensitive     ERYTHROMYCIN >=8 RESISTANT Resistant     GENTAMICIN <=0.5 SENSITIVE Sensitive     OXACILLIN <=0.25 SENSITIVE Sensitive     TETRACYCLINE <=1 SENSITIVE Sensitive     VANCOMYCIN 1 SENSITIVE Sensitive     TRIMETH/SULFA <=10 SENSITIVE Sensitive     CLINDAMYCIN INTERMEDIATE Intermediate     RIFAMPIN <=0.5 SENSITIVE Sensitive     Inducible Clindamycin NEGATIVE Sensitive     * FEW STAPHYLOCOCCUS CAPITIS    Coagulation Studies: No results for input(s): "LABPROT", "INR" in the last 72 hours.  Urinalysis: No results for input(s): "COLORURINE", "LABSPEC", "PHURINE", "GLUCOSEU", "HGBUR", "BILIRUBINUR", "KETONESUR", "PROTEINUR", "UROBILINOGEN", "NITRITE", "LEUKOCYTESUR" in the last 72 hours.  Invalid input(s): "APPERANCEUR"    Imaging: PERIPHERAL VASCULAR CATHETERIZATION  Result Date: 11/16/2022 See surgical note for result.    Medications:    sodium chloride Stopped  (11/16/22 1155)   anticoagulant sodium citrate      ceFAZolin (ANCEF) IV 2 g (11/14/22 1845)    ceFAZolin (ANCEF) IV Stopped (11/16/22 2000)    ceFAZolin (ANCEF) IV     methocarbamol (ROBAXIN) IV Stopped (11/08/22 1455)    (feeding supplement) PROSource Plus  30 mL Oral BID BM   Chlorhexidine Gluconate Cloth  6 each Topical Q0600   cinacalcet  30 mg Oral Q supper   epoetin (EPOGEN/PROCRIT) injection  4,000 Units Intravenous Q M,W,F-HD   feeding supplement (NEPRO CARB STEADY)  237 mL Oral TID BM   fentaNYL  1 patch Transdermal Q72H   gabapentin  100 mg Oral Once per day on Mon Wed Fri   Gerhardt's butt cream  Topical QID   levETIRAcetam  1,000 mg Oral Daily   levETIRAcetam  250 mg Oral Q M,W,F   metoprolol succinate  100 mg Oral Daily   pantoprazole  20 mg Oral BID   psyllium  1 packet Oral Daily   QUEtiapine  25 mg Oral QHS   sertraline  25 mg Oral Daily   sodium chloride flush  10-40 mL Intracatheter Q12H   umeclidinium-vilanterol  1 puff Inhalation Daily   alteplase, anticoagulant sodium citrate, fentaNYL (SUBLIMAZE) injection, heparin, heparin, hydrALAZINE, lidocaine (PF), lidocaine-prilocaine, LORazepam, methocarbamol (ROBAXIN) IV, ondansetron, senna-docusate, sodium chloride flush  Assessment/ Plan:  [name] is a 71 y.o.  female with end stage renal disease on hemodialysis, coronary artery disease, COPD with intermittent home O2, hypertension, hyperlipidemia, stroke, diabetes mellitus type II, and breast cancer who was admitted to [facility] on 10/12/2022 for Back pain [M54.9] Bilateral leg pain [M79.604, M79.605]  Patient was found to have vertebral osteomyelitis and psoas abscess. Cultures positive for staph capitis  [facility] MWF Left AVG 102.5kg  End-stage renal disease on hemodialysis. Resumed on MWF schedule.    Patient receiving dialysis today, left chest PermCath functioning well.  Next treatment scheduled for Monday.  Will place order for  nursing staff to remove right femoral HD temp cath.  Concerned that PT has signed off due to lack of patient participation. Patient still needs to sit in chair for outpatient dialysis.   2. Anemia of chronic kidney disease Normocytic Lab Results  Component Value Date   HGB 8.7 (L) 11/18/2022  Patient receives Mircera at outpatient clinic.  Continue low dose EPO with dialysis treatments..   3. Secondary Hyperparathyroidism: with outpatient labs: PTH 180 (3/11), phosphorus 5.4, calcium 9.3   Lab Results  Component Value Date   PTH 131 (H) 03/14/2018   CALCIUM 9.3 11/18/2022   CAION 1.09 (L) 08/28/2022   PHOS 5.9 (H) 11/16/2022   -Continue Cinacalcet. -Phosphorus remains slightly elevated however calcium acceptable.  4.  Hypotension on hemodialysis treatment. Holding home regimen of losartan, metoprolol and torsemide. Receiving metoprolol 100mg  daily. Blood pressure 106/66 during dialysis.  5. Diabetes mellitus type II with chronic kidney disease/renal manifestations: insulin dependent, NPH.    6. Vertebral osteomyelitis, seen on CT lumber spine. Neurosurgery feels patient is not a surgical candidate CT guided aspiration of psoas abscess on 10/13/22. Cultures positive for Staph Capitis.   -Infectious disease recommending cefazolin with dialysis treatments. 8-week course in progress.    LOS: 37   4/26/202410:45 AM

## 2022-11-18 NOTE — Progress Notes (Signed)
Progress Note   [name] ZOX:[id number] DOB: [birth year] DOA: 10/12/2022     37 DOS: the patient was seen and examined on 11/18/2022     Subjective: Patient seen and examined at bedside this morning  Denied worsening abdominal pain chest pain nausea vomiting or cough Patient apparently developed hypotension during hemodialysis today   brief hospital course:   Cynthia Dean is a 72 y/o F w/ PMH of ESRD on HD, COPD, DM2, HTN, depression, CVA who presented w/ back pain x 3 weeks.  Pt presented w/ back pain and was found to intradiscal abscess/lumbar discitis/b/l psoas abscess on MRI status post IR guided drainage. Patient has had waxing and waning mental status with intermittently refusing dialysis. Patient's family have decided to continue hemodialysis after speaking with their PCP. Hospital course prolonged, complicated by patient's inability to tolerate sitting in recliner chair for dialysis.  She only agrees to dialysis in the bed.  This is a barrier to discharge as no outpatient dialysis centers can accommodate.         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   Wound cx growing staph capitis.   ID consulted.  Abx de-escalated to cefazolin.   Not a good surgical candidate as per neuro surg.   Pt having severe low back and bilateral leg pain --cont cefazolin, for 6 weeks, End Date: 12/02/22 --pain management per orders -- palliative care following --transitioned from fentanyl patch + IV PRN >>> scheduled + PRN Norco on 4/17, since staff have not been using PRN fentanyl doses apparently for concern of side effects and/or hypotension.  patient cannot tolerate any position except laying nearly flat in the bed     Confusion and agitation Hospital  delirium --Pt was agitated, and also not responding to questions or commands during early hospitalization.  Mental status did start to improve, and now pt is calm and interactive, however, at this time she has been having waxing and waning mental status --Delirium precautions --cont seroquel 50 mg nightly to help with delirium and sleep.     ESRD: on HD MWF.  Pt refused HD on 10/17/22, 10/18/22 and 3/27, 4/1.   Since then, has been agreeable to dialysis without further issues, getting dialysis in hospital bed. When attempts made to do HD in chair, she did not tolerate on account of severe back pain --Recommend consideration for home hemodialysis if no facility capable to do HD in the bed 4/18 --HD access / fistula clotted off during dialysis 4/19 --pt taken for procedure to address fistula malfunction, but was agitated and combative, resfused procedure.   4/20 --due to dialysis, no access at this time.  Afternoon - vascular placed R femoral temp HD cath placed   Dialysis resumed.  Patient underwent PermCath placement 4/24      Generalized weakness / Physical Debility:  PT recs SNF. TOC following. Pt is bedridden and very high risk for developing pressure injuries of skin -- reposition frequently.   Monitor pressures areas on skin and offload as possible.   Severe back and bilateral leg pain due to severe multi-level lumbar degenerative disc disease. Reviewed Lumbar MRI findings with family.  Pt has bulging discs at L2-3, L3-4, L4-5 and L5-S1. Pt will continue to have back and leg pain once antibiotics  are complete. --Pain mgmt per orders  --Pain control complicated by AMS and hypotension with escalation   ACD:  likely secondary ESRD. H&H are stable    Hypokalemia:  4/8: K 3.6 --monitor and replete PRN    Peripheral neuropathy --  4/4 - Cautious trial of gabapentin 100 mg daily (ESRD - monitor closely for side effects) 4% - Pt notes significant improvement and no side effects so  far --Continue low dose gabapentin and monitor   Hx of seizures:  continue on home dose of Keppra   HLD:  continue on statin    Hx of COPD: w/o exacerbation.  Continue on bronchodilators    DM2: well controlled HbA1c 5.2.  --d/c'ed BG checks    HTN:  Antihypertensives as blood pressure allows   GERD:  continue on PPI    Depression: severity unknown.  Continue on home dose of sertraline     Overweight: BMI 28.2.  Wt loss efforts recommended   Right foot pain - see peripheral neuropathy     DVT prophylaxis: Heparin SQ Code Status: Full code      Family Communication: Patient's cousin present at bedside     11/15/22 - POA daughter Cynthia Dean states no other family member besides herself and patient's Cynthia Dean should be given any updates on status or plan of care going forward   Laboratory data reviewed: I personally reviewed laboratory data showing sodium 135 potassium 4.0 glucose 134 with creatinine of 6.09 today   Consultants - Nephrology, Infectious Disease, Palliative Care   Level of care: Telemetry Medical   Dispo:   The patient is from: home Anticipated d/c is to: SNF  Anticipated d/c date is: TBD pending GOC decisions   Physical Exam:   General exam: awake alert, no acute distress Respiratory system: on room air, normal respiratory effort. Cardiovascular system: RRR, no peripheral LE edema Gastrointestinal system: soft, NT, ND  Central nervous system: normal speech no gross focal neurologic deficits Extremities:  LUE fistula noted, R femoral temp dialysis catheter in place Skin: dry, intact, normal temperature Psychiatry: normal mood, congruent affect    Time spent: 35 minutes  Vitals:   11/18/22 1000 11/18/22 1030 11/18/22 1100 11/18/22 1130  BP: (!) 95/55 114/63 125/82 124/69  Pulse: 85 86 85 86  Resp: 14 17 20 18   Temp:      TempSrc:      SpO2: 100% 98% 98% 97%  Weight:      Height:          Author: Loyce Dys, MD 11/18/2022 3:14  PM  For on call review [email or web address].

## 2022-11-18 NOTE — Progress Notes (Signed)
Progress Note    11/18/2022 9:15 AM 2 Days Post-Op  Subjective: Cynthia Dean is now postop day 2 from [facility] dialysis catheter placement.  No complications from the procedure to note.  No hematoma seroma or infection noted.  Patient's vitals also remained stable.  Patient remains pleasantly confused this morning.  She endorses she is fine.   Vitals:   11/18/22 0830 11/18/22 0845  BP: 118/67 (!) 157/70  Pulse: 79 86  Resp: 14 20  Temp: 98.3 F (36.8 C)   SpO2: 97% 100%   Physical Exam: Cardiac:  RRR, no peripheral LE edema no rubs, or murmurs Lungs: On auscultation all lung fields are clear, patient remains on room air, normal respiratory effort. Incisions: Left chest dialysis catheter placement.  Clean dry and intact with dressing in place. Extremities: Lateral lower extremities +1 with edema.  Left upper extremity fistula noted.  No bruits or thrills to note. Abdomen: Positive bowel sounds all 4 quadrants, soft, nontender, nondistended. Neurologic: Alert and oriented x 1.  Follows commands.  CBC    Component Value Date/Time   WBC 6.3 11/18/2022 0445   RBC 3.55 (L) 11/18/2022 0445   HGB 8.7 (L) 11/18/2022 0445   HGB 9.5 (L) 06/17/2014 0538   HCT 29.0 (L) 11/18/2022 0445   HCT 29.6 (L) 06/17/2014 0538   PLT 161 11/18/2022 0445   PLT 173 06/17/2014 0538   MCV 81.7 11/18/2022 0445   MCV 92 06/17/2014 0538   MCH 24.5 (L) 11/18/2022 0445   MCHC 30.0 11/18/2022 0445   RDW 20.2 (H) 11/18/2022 0445   RDW 16.2 (H) 06/17/2014 0538   LYMPHSABS 2.1 11/18/2022 0445   LYMPHSABS 2.5 06/17/2014 0538   MONOABS 0.8 11/18/2022 0445   MONOABS 1.0 (H) 06/17/2014 0538   EOSABS 0.1 11/18/2022 0445   EOSABS 0.2 06/17/2014 0538   BASOSABS 0.0 11/18/2022 0445   BASOSABS 0.1 06/17/2014 0538    BMET    Component Value Date/Time   NA 135 11/18/2022 0445   NA 135 (L) 06/16/2014 0915   K 4.0 11/18/2022 0445   K 4.8 06/16/2014 0915   CL 99 11/18/2022 0445   CL 100 06/16/2014 0915    CO2 28 11/18/2022 0445   CO2 22 06/16/2014 0915   GLUCOSE 134 (H) 11/18/2022 0445   GLUCOSE 226 (H) 06/16/2014 0915   BUN 35 (H) 11/18/2022 0445   BUN 47 (H) 06/16/2014 0915   CREATININE 6.09 (H) 11/18/2022 0445   CREATININE 7.60 (H) 06/16/2014 0915   CALCIUM 9.3 11/18/2022 0445   CALCIUM 7.2 (L) 06/16/2014 0915   GFRNONAA 7 (L) 11/18/2022 0445   GFRNONAA 6 (L) 06/16/2014 0915   GFRNONAA 6 (L) 04/07/2014 0918   GFRAA 5 (L) 01/30/2020 0500   GFRAA 7 (L) 06/16/2014 0915   GFRAA 7 (L) 04/07/2014 0918    INR    Component Value Date/Time   INR 1.7 (H) 03/24/2022 1656   INR 1.0 06/17/2014 0538     Intake/Output Summary (Last 24 hours) at 11/18/2022 0915 Last data filed at 11/17/2022 1411 Gross per 24 hour  Intake 90 ml  Output --  Net 90 ml     Assessment/Plan:  72 y.o. female is s/p dialysis permacath placement to left chest.  2 Days Post-Op   Plan: Successful placement of dialysis permacath placement to the left chest.  Patient now may undergo hemodialysis.  Vascular surgery will sign off this case at this time.  If needed again please reconsult.  Thank you  DVT prophylaxis:  Heparin with dialysis   Marcie [facility] 11/18/2022 9:15 AM

## 2022-11-19 DIAGNOSIS — M4646 Discitis, unspecified, lumbar region: Secondary | ICD-10-CM | POA: Diagnosis not present

## 2022-11-19 LAB — CBC WITH DIFFERENTIAL/PLATELET
Abs Immature Granulocytes: 0.03 10*3/uL (ref 0.00–0.07)
Basophils Absolute: 0 10*3/uL (ref 0.0–0.1)
Basophils Relative: 1 %
Eosinophils Absolute: 0.2 10*3/uL (ref 0.0–0.5)
Eosinophils Relative: 3 %
HCT: 30.2 % — ABNORMAL LOW (ref 36.0–46.0)
Hemoglobin: 9 g/dL — ABNORMAL LOW (ref 12.0–15.0)
Immature Granulocytes: 1 %
Lymphocytes Relative: 28 %
Lymphs Abs: 1.8 10*3/uL (ref 0.7–4.0)
MCH: 24.7 pg — ABNORMAL LOW (ref 26.0–34.0)
MCHC: 29.8 g/dL — ABNORMAL LOW (ref 30.0–36.0)
MCV: 82.7 fL (ref 80.0–100.0)
Monocytes Absolute: 0.8 10*3/uL (ref 0.1–1.0)
Monocytes Relative: 13 %
Neutro Abs: 3.4 10*3/uL (ref 1.7–7.7)
Neutrophils Relative %: 54 %
Platelets: 144 10*3/uL — ABNORMAL LOW (ref 150–400)
RBC: 3.65 MIL/uL — ABNORMAL LOW (ref 3.87–5.11)
RDW: 20.1 % — ABNORMAL HIGH (ref 11.5–15.5)
WBC: 6.2 10*3/uL (ref 4.0–10.5)
nRBC: 0 % (ref 0.0–0.2)

## 2022-11-19 LAB — BASIC METABOLIC PANEL
Anion gap: 9 (ref 5–15)
BUN: 22 mg/dL (ref 8–23)
CO2: 29 mmol/L (ref 22–32)
Calcium: 9.5 mg/dL (ref 8.9–10.3)
Chloride: 99 mmol/L (ref 98–111)
Creatinine, Ser: 4.51 mg/dL — ABNORMAL HIGH (ref 0.44–1.00)
GFR, Estimated: 10 mL/min — ABNORMAL LOW (ref >60)
Glucose, Bld: 145 mg/dL — ABNORMAL HIGH (ref 70–99)
Potassium: 3.8 mmol/L (ref 3.5–5.1)
Sodium: 137 mmol/L (ref 135–145)

## 2022-11-19 LAB — GLUCOSE, CAPILLARY: Glucose-Capillary: 131 mg/dL — ABNORMAL HIGH (ref 70–99)

## 2022-11-19 NOTE — Progress Notes (Signed)
[facility]  ROUNDING NOTE   Subjective:   Cynthia Dean is a 72 y.o. 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 She presents to the ED with worsening lower extremity pain. Patient will be admitted for Back pain [M54.9] Bilateral leg pain [M79.604, M79.605]  No complaints this morning. Appears cheerful.  Objective:  Vital signs in last 24 hours:  Temp:  [98.3 F (36.8 C)-98.7 F (37.1 C)] 98.6 F (37 C) (04/27 0852) Pulse Rate:  [79-97] 97 (04/27 0852) Resp:  [12-22] 18 (04/27 0852) BP: (95-132)/(55-82) 117/57 (04/27 0852) SpO2:  [94 %-100 %] 96 % (04/27 0852) Weight:  [88.3 kg] 88.3 kg (04/26 1213)  Weight change:  Filed Weights   11/16/22 1914 11/18/22 0831 11/18/22 1213  Weight: 89.4 kg 89.3 kg 88.3 kg    Intake/Output: I/O last 3 completed shifts: In: -  Out: 1000 [Other:1000]   Intake/Output this shift:  No intake/output data recorded.  Physical Exam: General: NAD  Head: Normocephalic, atraumatic.  Moist oral mucosal membranes  Eyes: Anicteric  Lungs:  Clear to auscultation  Heart: Regular rate and rhythm  Abdomen:  Soft, nontender  Extremities: No peripheral edema.  Neurologic: Alert  Skin: No lesions  Access: Left AVG (no bruit/thrill)), left chest PermCath    Basic Metabolic Panel: Recent Labs  Lab 11/12/22 0912 11/16/22 1508 11/17/22 0650 11/18/22 0445 11/19/22 0439  NA 138 136 136 135 137  K 3.9 4.3 3.8 4.0 3.8  CL 101 97* 100 99 99  CO2 23 27 27 28 29   GLUCOSE 199* 161* 107* 134* 145*  BUN 65* 40* 22 35* 22  CREATININE 11.16* 7.49* 4.63* 6.09* 4.51*  CALCIUM 9.5 9.9 9.3 9.3 9.5  PHOS 7.1* 5.9*  --   --   --      Liver Function Tests: Recent Labs  Lab 11/12/22 0912 11/16/22 1508  ALBUMIN 2.4* 2.4*    No results for input(s): "LIPASE", "AMYLASE" in the last 168 hours. No  results for input(s): "AMMONIA" in the last 168 hours.  CBC: Recent Labs  Lab 11/12/22 0912 11/16/22 1508 11/17/22 0650 11/18/22 0445 11/19/22 0439  WBC 7.5 6.4 6.8 6.3 6.2  NEUTROABS  --   --  3.2 3.2 3.4  HGB 9.0* 9.6* 9.0* 8.7* 9.0*  HCT 30.8* 31.5* 30.3* 29.0* 30.2*  MCV 82.6 82.5 83.7 81.7 82.7  PLT 156 164 155 161 144*     Cardiac Enzymes: No results for input(s): "CKTOTAL", "CKMB", "CKMBINDEX", "TROPONINI" in the last 168 hours.  BNP: Invalid input(s): "POCBNP"  CBG: No results for input(s): "GLUCAP" in the last 168 hours.   Microbiology: Results for orders placed or performed during the hospital encounter of 10/12/22  Blood culture (routine x 2)     Status: None   Collection Time: 10/12/22  5:40 PM   Specimen: BLOOD  Result Value Ref Range Status   Specimen Description BLOOD BLOOD RIGHT HAND [facility]  Final   Special Requests   Final    BOTTLES DRAWN AEROBIC AND ANAEROBIC Blood Culture results may not be optimal due to an inadequate volume of blood received in culture bottles   Culture   Final    NO GROWTH 5 DAYS Performed at [facility], [address], [place], [place] [address]    Report Status 10/17/2022 FINAL  Final  Blood culture (routine x 2)     Status: None  Collection Time: 10/12/22  6:56 PM   Specimen: BLOOD  Result Value Ref Range Status   Specimen Description BLOOD BLOOD RIGHT FOREARM  Final   Special Requests   Final    BOTTLES DRAWN AEROBIC ONLY Blood Culture results may not be optimal due to an inadequate volume of blood received in culture bottles   Culture   Final    NO GROWTH 5 DAYS Performed at [facility], [address] [place], [place] [address]    Report Status 10/17/2022 FINAL  Final  Aerobic/Anaerobic Culture w Gram Stain (surgical/deep wound)     Status: None   Collection Time: 10/13/22 11:44 AM   Specimen: Abscess  Result Value Ref Range Status   Specimen Description   Final    ABSCESS Performed at  [facility], [address], [place], [place] [address]    Special Requests   Final    MUSCLE ABSCESS Performed at [facility], [address] [place], [place] [address]    Gram Stain   Final    ABUNDANT WBC PRESENT, PREDOMINANTLY PMN NO ORGANISMS SEEN    Culture   Final    FEW STAPHYLOCOCCUS CAPITIS NO ANAEROBES ISOLATED Performed at [facility], [address] [place], [place] [address]    Report Status 10/18/2022 FINAL  Final   Organism ID, Bacteria STAPHYLOCOCCUS CAPITIS  Final      Susceptibility   Staphylococcus capitis - MIC*    CIPROFLOXACIN <=0.5 SENSITIVE Sensitive     ERYTHROMYCIN >=8 RESISTANT Resistant     GENTAMICIN <=0.5 SENSITIVE Sensitive     OXACILLIN <=0.25 SENSITIVE Sensitive     TETRACYCLINE <=1 SENSITIVE Sensitive     VANCOMYCIN 1 SENSITIVE Sensitive     TRIMETH/SULFA <=10 SENSITIVE Sensitive     CLINDAMYCIN INTERMEDIATE Intermediate     RIFAMPIN <=0.5 SENSITIVE Sensitive     Inducible Clindamycin NEGATIVE Sensitive     * FEW STAPHYLOCOCCUS CAPITIS    Coagulation Studies: No results for input(s): "LABPROT", "INR" in the last 72 hours.  Urinalysis: No results for input(s): "COLORURINE", "LABSPEC", "PHURINE", "GLUCOSEU", "HGBUR", "BILIRUBINUR", "KETONESUR", "PROTEINUR", "UROBILINOGEN", "NITRITE", "LEUKOCYTESUR" in the last 72 hours.  Invalid input(s): "APPERANCEUR"    Imaging: No results found.   Medications:    sodium chloride Stopped (11/16/22 1155)   anticoagulant sodium citrate      ceFAZolin (ANCEF) IV 2 g (11/14/22 1845)    ceFAZolin (ANCEF) IV Stopped (11/16/22 2000)    ceFAZolin (ANCEF) IV Stopped (11/18/22 1204)   methocarbamol (ROBAXIN) IV Stopped (11/08/22 1455)    (feeding supplement) PROSource Plus  30 mL Oral BID BM   Chlorhexidine Gluconate Cloth  6 each Topical Q0600   cinacalcet  30 mg Oral Q supper   epoetin (EPOGEN/PROCRIT) injection  4,000 Units Intravenous Q M,W,F-HD   feeding supplement  (NEPRO CARB STEADY)  237 mL Oral TID BM   fentaNYL  1 patch Transdermal Q72H   gabapentin  100 mg Oral Once per day on Mon Wed Fri   Gerhardt's butt cream   Topical QID   levETIRAcetam  1,000 mg Oral Daily   levETIRAcetam  250 mg Oral Q M,W,F   metoprolol succinate  100 mg Oral Daily   pantoprazole  20 mg Oral BID   psyllium  1 packet Oral Daily   QUEtiapine  25 mg Oral QHS   sertraline  25 mg Oral Daily   sodium chloride flush  10-40 mL Intracatheter Q12H   umeclidinium-vilanterol  1  puff Inhalation Daily   alteplase, anticoagulant sodium citrate, fentaNYL (SUBLIMAZE) injection, hydrALAZINE, lidocaine (PF), lidocaine-prilocaine, LORazepam, methocarbamol (ROBAXIN) IV, ondansetron, senna-docusate, sodium chloride flush  Assessment/ Plan:  Cynthia Dean is a 72 y.o.  female with end stage renal disease on hemodialysis, coronary artery disease, COPD with intermittent home O2, hypertension, hyperlipidemia, stroke, diabetes mellitus type II, and breast cancer who was admitted to [facility] on 10/12/2022 for Back pain [M54.9] Bilateral leg pain [M79.604, M79.605]  Patient was found to have vertebral osteomyelitis and psoas abscess. Cultures positive for staph capitis  [name] MWF Left AVG 102.5kg  End-stage renal disease on hemodialysis. on MWF schedule.    Patient receiving dialysis today, left chest PermCath functioning well.  Next treatment scheduled for Monday.  Concerned that PT has signed off due to lack of patient participation. Patient still needs to sit in chair for outpatient dialysis.   Anemia of chronic kidney disease Normocytic Lab Results  Component Value Date   HGB 9.0 (L) 11/19/2022  Patient receives Mircera at outpatient clinic.  Continue low dose EPO with dialysis treatments..   Secondary Hyperparathyroidism: with outpatient labs: PTH 180 (3/11), phosphorus 5.4, calcium 9.3   Lab Results  Component Value Date   PTH 131 (H) 03/14/2018    CALCIUM 9.5 11/19/2022   CAION 1.09 (L) 08/28/2022   PHOS 5.9 (H) 11/16/2022   -Continue Cinacalcet. -Phosphorus remains slightly elevated however calcium acceptable.  Hypotension during hemodialysis treatment.  Will monitor.  If remains hypotensive, may need to reduce dose of metoprolol to 75 mg.  Diabetes mellitus type II with chronic kidney disease/renal manifestations: insulin dependent, NPH.    Vertebral osteomyelitis, seen on CT lumber spine. Neurosurgery feels patient is not a surgical candidate CT guided aspiration of psoas abscess on 10/13/22. Cultures positive for Staph Capitis.   -Infectious disease recommending cefazolin with dialysis treatments. 8-week course in progress.    LOS: 38 Sebastiana Wuest 4/27/20249:01 AM

## 2022-11-19 NOTE — Progress Notes (Signed)
Progress Note   [name] DOB: 06-27-1951 DOA: 10/12/2022     38 DOS: the patient was seen and examined on 11/19/2022    Subjective: Patient seen and examined at bedside this morning  She tells me she wants to continue with dialysis Denies chest pain nausea vomiting abdominal pain I tried to sit her up in the bed by raising the head end of her bed however she told me it makes her feel sick.  brief hospital course:   Cynthia Dean is a 72 y/o F w/ PMH of ESRD on HD, COPD, DM2, HTN, depression, CVA who presented w/ back pain x 3 weeks.  Pt presented w/ back pain and was found to intradiscal abscess/lumbar discitis/b/l psoas abscess on MRI status post IR guided drainage. Patient has had waxing and waning mental status with intermittently refusing dialysis. Patient's family have decided to continue hemodialysis after speaking with their PCP. Hospital course prolonged, complicated by patient's inability to tolerate sitting in recliner chair for dialysis.  She only agrees to dialysis in the bed.  This is a barrier to discharge as no outpatient dialysis centers can accommodate.         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   Wound cx growing staph capitis.   ID consulted.  Abx de-escalated to cefazolin.   Not a good surgical candidate as per neuro surg.   Pt having severe low back and bilateral leg pain --cont cefazolin, for 6 weeks, End Date: 12/02/22 --pain management per orders -- palliative care following --transitioned from fentanyl patch + IV PRN >>> scheduled + PRN Norco on 4/17, since staff have not been using PRN fentanyl doses apparently for concern of side effects and/or hypotension.  patient cannot tolerate any position except laying  nearly flat in the bed     Confusion and agitation Hospital delirium --Pt was agitated, and also not responding to questions or commands during early hospitalization.  Mental status did start to improve, and now pt is calm and interactive, however, at this time she has been having waxing and waning mental status --Delirium precautions --cont seroquel 50 mg nightly to help with delirium and sleep.     ESRD: on HD MWF.  Pt refused HD on 10/17/22, 10/18/22 and 3/27, 4/1.   Since then, has been agreeable to dialysis without further issues, getting dialysis in hospital bed. When attempts made to do HD in chair, she did not tolerate on account of severe back pain --Recommend consideration for home hemodialysis if no facility capable to do HD in the bed 4/18 --HD access / fistula clotted off during dialysis 4/19 --pt taken for procedure to address fistula malfunction, but was agitated and combative, resfused procedure.   4/20 --due to dialysis, no access at this time.  Afternoon - vascular placed R femoral temp HD cath placed   Dialysis resumed.  Patient underwent PermCath placement 4/24      Generalized weakness / Physical Debility:  PT recs SNF. TOC following. Pt is bedridden and very high risk for developing pressure injuries of skin -- reposition frequently.   Monitor pressures areas on skin and offload as possible.   Severe back and bilateral leg pain due to severe multi-level lumbar degenerative disc disease. Reviewed Lumbar MRI findings with  family.  Pt has bulging discs at L2-3, L3-4, L4-5 and L5-S1. Pt will continue to have back and leg pain once antibiotics are complete. --Pain mgmt per orders  --Pain control complicated by AMS and hypotension with escalation   ACD:  likely secondary ESRD. H&H are stable    Hypokalemia:  4/8: K 3.6 --monitor and replete PRN    Peripheral neuropathy --  4/4 - Cautious trial of gabapentin 100 mg daily (ESRD - monitor closely for side  effects) 4% - Pt notes significant improvement and no side effects so far --Continue low dose gabapentin and monitor   Hx of seizures:  continue on home dose of Keppra   HLD:  continue on statin    Hx of COPD: w/o exacerbation.  Continue on bronchodilators    DM2: well controlled HbA1c 5.2.  --d/c'ed BG checks    HTN:  Antihypertensives as blood pressure allows   GERD:  continue on PPI    Depression: severity unknown.  Continue on home dose of sertraline     Overweight: BMI 28.2.  Wt loss efforts recommended   Right foot pain - see peripheral neuropathy     DVT prophylaxis: Heparin SQ Code Status: Full code      Family Communication: Patient's cousin present at bedside     11/15/22 - POA daughter [name] states no other family member besides herself and patient's [name] should be given any updates on status or plan of care going forward   Laboratory data reviewed: I personally reviewed laboratory data showing  Sodium 127 potassium 3.8 creatinine 4.5  Consultants - Nephrology, Infectious Disease, Palliative Care   Level of care: Telemetry Medical   Dispo:   The patient is from: home Anticipated d/c is to: SNF  Anticipated d/c date is: TBD pending GOC decisions   Physical Exam:   General exam: awake alert, no acute distress Respiratory system: on room air, normal respiratory effort. Cardiovascular system: RRR, no peripheral LE edema Gastrointestinal system: soft, NT, ND  Central nervous system: normal speech no gross focal neurologic deficits Extremities:  LUE fistula noted, R femoral temp dialysis catheter in place Skin: dry, intact, normal temperature Psychiatry: normal mood, congruent affect     Time spent: 35 minutes    Vitals:   11/19/22 0504 11/19/22 0852 11/19/22 0938 11/19/22 1220  BP: 132/62 (!) 117/57 138/71 (!) 122/53  Pulse: 89 97 91 85  Resp: 20 18    Temp: 98.7 F (37.1 C) 98.6 F (37 C)  98.2 F (36.8 C)  TempSrc:  Oral  Oral   SpO2: 96% 96%  100%  Weight:      Height:        Author: Loyce Dys, MD 11/19/2022 2:08 PM  For on call review [email or web address].

## 2022-11-20 DIAGNOSIS — M4646 Discitis, unspecified, lumbar region: Secondary | ICD-10-CM | POA: Diagnosis not present

## 2022-11-20 LAB — BASIC METABOLIC PANEL
Anion gap: 10 (ref 5–15)
BUN: 35 mg/dL — ABNORMAL HIGH (ref 8–23)
CO2: 27 mmol/L (ref 22–32)
Calcium: 10.1 mg/dL (ref 8.9–10.3)
Chloride: 99 mmol/L (ref 98–111)
Creatinine, Ser: 6.25 mg/dL — ABNORMAL HIGH (ref 0.44–1.00)
GFR, Estimated: 7 mL/min — ABNORMAL LOW (ref >60)
Glucose, Bld: 148 mg/dL — ABNORMAL HIGH (ref 70–99)
Potassium: 3.8 mmol/L (ref 3.5–5.1)
Sodium: 136 mmol/L (ref 135–145)

## 2022-11-20 NOTE — Progress Notes (Addendum)
Progress Note   [name] ZOX:[id number] DOB: [birth year] DOA: 10/12/2022     39 DOS: the patient was seen and examined on 11/20/2022     Subjective: Patient seen and examined at bedside this morning  She appears confused about dialysis wishes Although she tells me she is waiting to have dialysis tomorrow Denies chest pain nausea vomiting abdominal pain    brief hospital course:   Cynthia Dean is a 72 y/o F w/ PMH of ESRD on HD, COPD, DM2, HTN, depression, CVA who presented w/ back pain x 3 weeks.  Pt presented w/ back pain and was found to intradiscal abscess/lumbar discitis/b/l psoas abscess on MRI status post IR guided drainage. Patient has had waxing and waning mental status with intermittently refusing dialysis. Patient's family have decided to continue hemodialysis after speaking with their PCP. Hospital course prolonged, complicated by patient's inability to tolerate sitting in recliner chair for dialysis.  She only agrees to dialysis in the bed.  This is a barrier to discharge as no outpatient dialysis centers can accommodate.         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   Wound cx growing staph capitis.   ID consulted.  Abx de-escalated to cefazolin.   Not a good surgical candidate as per neuro surg.   Pt having severe low back and bilateral leg pain --cont cefazolin, for 6 weeks, End Date: 12/02/22 --pain management per orders -- palliative care following --transitioned from fentanyl patch + IV PRN >>> scheduled + PRN Norco on 4/17, since staff have not been using PRN fentanyl doses apparently for concern of side effects and/or hypotension.  patient cannot tolerate any position except laying nearly flat in the bed     Confusion and  agitation Hospital delirium --Pt was agitated, and also not responding to questions or commands during early hospitalization.  Mental status did start to improve, and now pt is calm and interactive, however, at this time she has been having waxing and waning mental status --Delirium precautions --cont seroquel 50 mg nightly to help with delirium and sleep.     ESRD: on HD MWF.  Pt refused HD on 10/17/22, 10/18/22 and 3/27, 4/1.   Since then, has been agreeable to dialysis without further issues, getting dialysis in hospital bed. When attempts made to do HD in chair, she did not tolerate on account of severe back pain --Recommend consideration for home hemodialysis if no facility capable to do HD in the bed 4/18 --HD access / fistula clotted off during dialysis 4/19 --pt taken for procedure to address fistula malfunction, but was agitated and combative, resfused procedure.   4/20 --due to dialysis, no access at this time.  Afternoon - vascular placed R femoral temp HD cath placed   Dialysis resumed.  Patient underwent PermCath placement 4/24      Generalized weakness / Physical Debility:  PT recs SNF. TOC following. Pt is bedridden and very high risk for developing pressure injuries of skin -- reposition frequently.   Monitor pressures areas on skin and offload as possible.   Severe back and bilateral leg pain due to severe multi-level lumbar degenerative disc disease. Reviewed Lumbar MRI findings with family.  Pt has bulging discs at L2-3, L3-4, L4-5 and L5-S1. Pt will continue  to have back and leg pain once antibiotics are complete. --Pain mgmt per orders  --Pain control complicated by AMS and hypotension with escalation   ACD:  likely secondary ESRD. H&H are stable    Hypokalemia:  4/8: K 3.6 --monitor and replete PRN    Peripheral neuropathy --  4/4 - Cautious trial of gabapentin 100 mg daily (ESRD - monitor closely for side effects) 4% - Pt notes significant improvement and  no side effects so far --Continue low dose gabapentin and monitor   Hx of seizures:  continue on home dose of Keppra   HLD:  continue on statin    Hx of COPD: w/o exacerbation.  Continue on bronchodilators    DM2: well controlled HbA1c 5.2.  --d/c'ed BG checks    HTN:  Antihypertensives as blood pressure allows   GERD:  continue on PPI    Depression: severity unknown.  Continue on home dose of sertraline     Overweight: BMI 28.2.  Wt loss efforts recommended   Right foot pain - see peripheral neuropathy     DVT prophylaxis: Heparin SQ Code Status: Full code      Family Communication: I discussed with patient's daughter Cynthia Dean over the phone and according to her she is still having discussion with the rest of the family concerning goals of care.     11/15/22 - POA daughter Cynthia Dean states no other family member besides herself and patient's Cynthia Dean should be given any updates on status or plan of care going forward   Laboratory data reviewed: I personally reviewed laboratory data showing  Sodium 136 potassium 3.8   Consultants - Nephrology, Infectious Disease, Palliative Care   Level of care: Telemetry Medical   Dispo:   The patient is from: home Anticipated d/c is to: SNF  Anticipated d/c date is: TBD pending GOC decisions   Physical Exam:   General exam: awake alert, no acute distress Respiratory system: on room air, normal respiratory effort. Cardiovascular system: RRR, no peripheral LE edema Gastrointestinal system: soft, NT, ND  Central nervous system: normal speech no gross focal neurologic deficits Extremities:  LUE fistula noted, R femoral temp dialysis catheter in place Skin: dry, intact, normal temperature Psychiatry: normal mood, congruent affect     Time spent: 38 minutes       Vitals:   11/19/22 1637 11/19/22 2058 11/20/22 0453 11/20/22 0759  BP: (!) 143/71 125/67 122/68 (!) 142/65  Pulse: 78 75 74 72  Resp: 16 16 16 16   Temp: 98.1  F (36.7 C) 98.3 F (36.8 C) 97.9 F (36.6 C) (!) 97.5 F (36.4 C)  TempSrc: Oral     SpO2: 90% 100% 97% 100%  Weight:      Height:        Author: Loyce Dys, MD 11/20/2022 1:36 PM  For on call review [email or web address].

## 2022-11-20 NOTE — Progress Notes (Signed)
[facility]  ROUNDING NOTE   Subjective:   Cynthia Dean is a 72 y.o. 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 She presents to the ED with worsening lower extremity pain. Patient will be admitted for Back pain [M54.9] Bilateral leg pain [M79.604, M79.605]  No complaints this morning. No SOB or edema  Objective:  Vital signs in last 24 hours:  Temp:  [97.5 F (36.4 C)-98.3 F (36.8 C)] 97.5 F (36.4 C) (04/28 0759) Pulse Rate:  [72-85] 72 (04/28 0759) Resp:  [16] 16 (04/28 0759) BP: (122-143)/(53-71) 142/65 (04/28 0759) SpO2:  [90 %-100 %] 100 % (04/28 0759)  Weight change:  Filed Weights   11/16/22 1914 11/18/22 0831 11/18/22 1213  Weight: 89.4 kg 89.3 kg 88.3 kg    Intake/Output: I/O last 3 completed shifts: In: 640 [P.O.:240; IV Piggyback:400] Out: -    Intake/Output this shift:  No intake/output data recorded.  Physical Exam: General: NAD  Head: Normocephalic, atraumatic.  Moist oral mucosal membranes  Eyes: Anicteric  Lungs:  Clear to auscultation  Heart: Regular rate and rhythm  Abdomen:  Soft, nontender  Extremities: No peripheral edema.  Neurologic: Alert  Skin: No lesions  Access: Left AVG (no bruit/thrill)), left chest PermCath    Basic Metabolic Panel: Recent Labs  Lab 11/16/22 1508 11/17/22 0650 11/18/22 0445 11/19/22 0439 11/20/22 0502  NA 136 136 135 137 136  K 4.3 3.8 4.0 3.8 3.8  CL 97* 100 99 99 99  CO2 27 27 28 29 27   GLUCOSE 161* 107* 134* 145* 148*  BUN 40* 22 35* 22 35*  CREATININE 7.49* 4.63* 6.09* 4.51* 6.25*  CALCIUM 9.9 9.3 9.3 9.5 10.1  PHOS 5.9*  --   --   --   --      Liver Function Tests: Recent Labs  Lab 11/16/22 1508  ALBUMIN 2.4*    No results for input(s): "LIPASE", "AMYLASE" in the last 168 hours. No results for input(s): "AMMONIA" in the last 168  hours.  CBC: Recent Labs  Lab 11/16/22 1508 11/17/22 0650 11/18/22 0445 11/19/22 0439  WBC 6.4 6.8 6.3 6.2  NEUTROABS  --  3.2 3.2 3.4  HGB 9.6* 9.0* 8.7* 9.0*  HCT 31.5* 30.3* 29.0* 30.2*  MCV 82.5 83.7 81.7 82.7  PLT 164 155 161 144*     Cardiac Enzymes: No results for input(s): "CKTOTAL", "CKMB", "CKMBINDEX", "TROPONINI" in the last 168 hours.  BNP: Invalid input(s): "POCBNP"  CBG: Recent Labs  Lab 11/19/22 2057  GLUCAP 131*     Microbiology: Results for orders placed or performed during the hospital encounter of 10/12/22  Blood culture (routine x 2)     Status: None   Collection Time: 10/12/22  5:40 PM   Specimen: BLOOD  Result Value Ref Range Status   Specimen Description BLOOD BLOOD RIGHT HAND [facility]  Final   Special Requests   Final    BOTTLES DRAWN AEROBIC AND ANAEROBIC Blood Culture results may not be optimal due to an inadequate volume of blood received in culture bottles   Culture   Final    NO GROWTH 5 DAYS Performed at [facility], [address] [place], [place] [address]    Report Status 10/17/2022 FINAL  Final  Blood culture (routine x 2)     Status: None   Collection Time: 10/12/22  6:56 PM   Specimen: BLOOD  Result Value  Ref Range Status   Specimen Description BLOOD BLOOD RIGHT FOREARM  Final   Special Requests   Final    BOTTLES DRAWN AEROBIC ONLY Blood Culture results may not be optimal due to an inadequate volume of blood received in culture bottles   Culture   Final    NO GROWTH 5 DAYS Performed at [facility], [address], [place], [place] [address]    Report Status 10/17/2022 FINAL  Final  Aerobic/Anaerobic Culture w Gram Stain (surgical/deep wound)     Status: None   Collection Time: 10/13/22 11:44 AM   Specimen: Abscess  Result Value Ref Range Status   Specimen Description   Final    ABSCESS Performed at [facility], [address], [place], [place] [address]    Special Requests   Final     MUSCLE ABSCESS Performed at [facility], [address] [place], [place] [address]    Gram Stain   Final    ABUNDANT WBC PRESENT, PREDOMINANTLY PMN NO ORGANISMS SEEN    Culture   Final    FEW STAPHYLOCOCCUS CAPITIS NO ANAEROBES ISOLATED Performed at [facility], [address], [place], [place] [address]    Report Status 10/18/2022 FINAL  Final   Organism ID, Bacteria STAPHYLOCOCCUS CAPITIS  Final      Susceptibility   Staphylococcus capitis - MIC*    CIPROFLOXACIN <=0.5 SENSITIVE Sensitive     ERYTHROMYCIN >=8 RESISTANT Resistant     GENTAMICIN <=0.5 SENSITIVE Sensitive     OXACILLIN <=0.25 SENSITIVE Sensitive     TETRACYCLINE <=1 SENSITIVE Sensitive     VANCOMYCIN 1 SENSITIVE Sensitive     TRIMETH/SULFA <=10 SENSITIVE Sensitive     CLINDAMYCIN INTERMEDIATE Intermediate     RIFAMPIN <=0.5 SENSITIVE Sensitive     Inducible Clindamycin NEGATIVE Sensitive     * FEW STAPHYLOCOCCUS CAPITIS    Coagulation Studies: No results for input(s): "LABPROT", "INR" in the last 72 hours.  Urinalysis: No results for input(s): "COLORURINE", "LABSPEC", "PHURINE", "GLUCOSEU", "HGBUR", "BILIRUBINUR", "KETONESUR", "PROTEINUR", "UROBILINOGEN", "NITRITE", "LEUKOCYTESUR" in the last 72 hours.  Invalid input(s): "APPERANCEUR"    Imaging: No results found.   Medications:    sodium chloride Stopped (11/16/22 1155)   anticoagulant sodium citrate      ceFAZolin (ANCEF) IV 200 mL/hr at 11/19/22 1502    ceFAZolin (ANCEF) IV Stopped (11/16/22 2000)    ceFAZolin (ANCEF) IV Stopped (11/18/22 1204)   methocarbamol (ROBAXIN) IV Stopped (11/08/22 1455)    (feeding supplement) PROSource Plus  30 mL Oral BID BM   Chlorhexidine Gluconate Cloth  6 each Topical Q0600   cinacalcet  30 mg Oral Q supper   epoetin (EPOGEN/PROCRIT) injection  4,000 Units Intravenous Q M,W,F-HD   feeding supplement (NEPRO CARB STEADY)  237 mL Oral TID BM   fentaNYL  1 patch Transdermal Q72H   gabapentin   100 mg Oral Once per day on Mon Wed Fri   Gerhardt's butt cream   Topical QID   levETIRAcetam  1,000 mg Oral Daily   levETIRAcetam  250 mg Oral Q M,W,F   metoprolol succinate  100 mg Oral Daily   pantoprazole  20 mg Oral BID   psyllium  1 packet Oral Daily   QUEtiapine  25 mg Oral QHS   sertraline  25 mg Oral Daily   sodium chloride flush  10-40 mL Intracatheter Q12H   umeclidinium-vilanterol  1 puff Inhalation Daily   alteplase, anticoagulant sodium citrate, fentaNYL (SUBLIMAZE) injection,  hydrALAZINE, lidocaine (PF), lidocaine-prilocaine, LORazepam, methocarbamol (ROBAXIN) IV, ondansetron, senna-docusate, sodium chloride flush  Assessment/ Plan:  Cynthia Dean is a 72 y.o.  female with end stage renal disease on hemodialysis, coronary artery disease, COPD with intermittent home O2, hypertension, hyperlipidemia, stroke, diabetes mellitus type II, and breast cancer who was admitted to [facility] on 10/12/2022 for Back pain [M54.9] Bilateral leg pain [M79.604, M79.605]  Patient was found to have vertebral osteomyelitis and psoas abscess. Cultures positive for staph capitis  [facility] MWF Left AVG 102.5kg  End-stage renal disease on hemodialysis. on MWF schedule.    Next HD planned for Monday.   Patient will need to sit in chair for duration of dialysis for outpatient transition.   Anemia of chronic kidney disease Normocytic Lab Results  Component Value Date   HGB 9.0 (L) 11/19/2022  Patient receives Mircera at outpatient clinic.  Continue low dose EPO with dialysis treatments..   Secondary Hyperparathyroidism: with outpatient labs: PTH 180 (3/11), phosphorus 5.4, calcium 9.3   Lab Results  Component Value Date   PTH 131 (H) 03/14/2018   CALCIUM 10.1 11/20/2022   CAION 1.09 (L) 08/28/2022   PHOS 5.9 (H) 11/16/2022   -Continue Cinacalcet. -Phosphorus remains slightly elevated however calcium acceptable.  Hypotension during hemodialysis treatment.  Will  monitor.  If remains hypotensive, may need to reduce dose of metoprolol to 75 mg.  Diabetes mellitus type II with chronic kidney disease/renal manifestations: insulin dependent, NPH.    Vertebral osteomyelitis, seen on CT lumber spine. Neurosurgery feels patient is not a surgical candidate CT guided aspiration of psoas abscess on 10/13/22. Cultures positive for Staph Capitis.   -Infectious disease recommending cefazolin with dialysis treatments. 8-week course in progress.    LOS: 39 Miaya Lafontant 4/28/202411:29 AM

## 2022-11-21 DIAGNOSIS — M4646 Discitis, unspecified, lumbar region: Secondary | ICD-10-CM | POA: Diagnosis not present

## 2022-11-21 LAB — CBC WITH DIFFERENTIAL/PLATELET
Abs Immature Granulocytes: 0.04 10*3/uL (ref 0.00–0.07)
Basophils Absolute: 0 10*3/uL (ref 0.0–0.1)
Basophils Relative: 1 %
Eosinophils Absolute: 0.2 10*3/uL (ref 0.0–0.5)
Eosinophils Relative: 3 %
HCT: 29.5 % — ABNORMAL LOW (ref 36.0–46.0)
Hemoglobin: 8.9 g/dL — ABNORMAL LOW (ref 12.0–15.0)
Immature Granulocytes: 1 %
Lymphocytes Relative: 26 %
Lymphs Abs: 2 10*3/uL (ref 0.7–4.0)
MCH: 24.9 pg — ABNORMAL LOW (ref 26.0–34.0)
MCHC: 30.2 g/dL (ref 30.0–36.0)
MCV: 82.4 fL (ref 80.0–100.0)
Monocytes Absolute: 0.9 10*3/uL (ref 0.1–1.0)
Monocytes Relative: 11 %
Neutro Abs: 4.6 10*3/uL (ref 1.7–7.7)
Neutrophils Relative %: 58 %
Platelets: 168 10*3/uL (ref 150–400)
RBC: 3.58 MIL/uL — ABNORMAL LOW (ref 3.87–5.11)
RDW: 19.9 % — ABNORMAL HIGH (ref 11.5–15.5)
WBC: 7.7 10*3/uL (ref 4.0–10.5)
nRBC: 0 % (ref 0.0–0.2)

## 2022-11-21 LAB — BASIC METABOLIC PANEL
Anion gap: 12 (ref 5–15)
BUN: 45 mg/dL — ABNORMAL HIGH (ref 8–23)
CO2: 26 mmol/L (ref 22–32)
Calcium: 10.2 mg/dL (ref 8.9–10.3)
Chloride: 99 mmol/L (ref 98–111)
Creatinine, Ser: 7.58 mg/dL — ABNORMAL HIGH (ref 0.44–1.00)
GFR, Estimated: 5 mL/min — ABNORMAL LOW (ref >60)
Glucose, Bld: 141 mg/dL — ABNORMAL HIGH (ref 70–99)
Potassium: 4 mmol/L (ref 3.5–5.1)
Sodium: 137 mmol/L (ref 135–145)

## 2022-11-21 MED ORDER — HEPARIN SODIUM (PORCINE) 1000 UNIT/ML IJ SOLN
INTRAMUSCULAR | Status: AC
  Filled 2022-11-21: qty 10

## 2022-11-21 MED ORDER — EPOETIN ALFA 4000 UNIT/ML IJ SOLN
INTRAMUSCULAR | Status: AC
  Filled 2022-11-21: qty 1

## 2022-11-21 NOTE — Progress Notes (Signed)
Progress Note   [name] ZOX:[id number] DOB: 01-04-1951 DOA: 10/12/2022     40 DOS: the patient was seen and examined on 11/21/2022     Subjective: Patient was seen while undergoing hemodialysis today I had extensive discussion with the hemodialysis nurses about possible need to make patient sitting in bed for dialysis as tolerated Denies chest pain nausea vomiting abdominal pain     brief hospital course:   Cynthia Dean is a 72 y/o F w/ PMH of ESRD on HD, COPD, DM2, HTN, depression, CVA who presented w/ back pain x 3 weeks.  Pt presented w/ back pain and was found to intradiscal abscess/lumbar discitis/b/l psoas abscess on MRI status post IR guided drainage. Patient has had waxing and waning mental status with intermittently refusing dialysis. Patient's family have decided to continue hemodialysis after speaking with their PCP. Hospital course prolonged, complicated by patient's inability to tolerate sitting in recliner chair for dialysis.  She only agrees to dialysis in the bed.  This is a barrier to discharge as no outpatient dialysis centers can accommodate.         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   Wound cx growing staph capitis.   ID consulted.  Abx de-escalated to cefazolin.   Not a good surgical candidate as per neuro surg.   Pt having severe low back and bilateral leg pain --cont cefazolin, for 6 weeks, End Date: 12/02/22 --pain management per orders -- palliative care following --transitioned from fentanyl patch + IV PRN >>> scheduled + PRN Norco on 4/17, since staff have not been using PRN fentanyl doses apparently for concern of side effects and/or hypotension.  patient cannot tolerate any position except laying nearly flat in the bed      Confusion and agitation Hospital delirium --Pt was agitated, and also not responding to questions or commands during early hospitalization.  Mental status did start to improve, and now pt is calm and interactive, however, at this time she has been having waxing and waning mental status --Delirium precautions --cont seroquel 50 mg nightly to help with delirium and sleep.     ESRD: on HD MWF.  Pt refused HD on 10/17/22, 10/18/22 and 3/27, 4/1.   Since then, has been agreeable to dialysis without further issues, getting dialysis in hospital bed. When attempts made to do HD in chair, she did not tolerate on account of severe back pain --Recommend consideration for home hemodialysis if no facility capable to do HD in the bed 4/18 --HD access / fistula clotted off during dialysis 4/19 --pt taken for procedure to address fistula malfunction, but was agitated and combative, resfused procedure.   4/20 --due to dialysis, no access at this time.  Afternoon - vascular placed R femoral temp HD cath placed   Dialysis resumed.  Patient underwent PermCath placement 4/24      Generalized weakness / Physical Debility:  PT recs SNF. TOC following. Pt is bedridden and very high risk for developing pressure injuries of skin -- reposition frequently.   Monitor pressures areas on skin and offload as possible.   Severe back and bilateral leg pain due to severe multi-level lumbar degenerative disc disease. Reviewed Lumbar MRI findings with family.  Pt has bulging discs at L2-3, L3-4, L4-5 and L5-S1.  Pt will continue to have back and leg pain once antibiotics are complete. --Pain mgmt per orders  --Pain control complicated by AMS and hypotension with escalation   ACD:  likely secondary ESRD. H&H are stable    Hypokalemia:  4/8: K 3.6 --monitor and replete PRN    Peripheral neuropathy --  4/4 - Cautious trial of gabapentin 100 mg daily (ESRD - monitor closely for side effects) 4% - Pt notes  significant improvement and no side effects so far --Continue low dose gabapentin and monitor   Hx of seizures:  continue on home dose of Keppra   HLD:  continue on statin    Hx of COPD: w/o exacerbation.  Continue on bronchodilators    DM2: well controlled HbA1c 5.2.  --d/c'ed BG checks    HTN:  Antihypertensives as blood pressure allows   GERD:  continue on PPI    Depression: severity unknown.  Continue on home dose of sertraline     Overweight: BMI 28.2.  Wt loss efforts recommended   Right foot pain - see peripheral neuropathy     DVT prophylaxis: Heparin SQ Code Status: Full code      Family Communication: I discussed with patient's daughter [name] over the phone.  He continues to have discussing with family and will let me know what to the rest of the family decides.     11/15/22 - POA daughter [name] states no other family member besides herself and patient's [name] should be given any updates on status or plan of care going forward   Laboratory data reviewed: I personally reviewed laboratory data showing  \Sodium 137 potassium 4.0 creatinine 7.58   Consultants - Nephrology, Infectious Disease, Palliative Care   Level of care: Telemetry Medical   Dispo:   The patient is from: home Anticipated d/c is to: SNF  Anticipated d/c date is: TBD pending GOC decisions   Physical Exam:   General exam: awake alert, no acute distress Respiratory system: on room air, normal respiratory effort. Cardiovascular system: RRR, no peripheral LE edema Gastrointestinal system: soft, NT, ND  Central nervous system: normal speech no gross focal neurologic deficits Extremities:  LUE fistula noted, R femoral temp dialysis catheter in place Skin: dry, intact, normal temperature Psychiatry: normal mood, congruent affect     Time spent: 45 minutes       Vitals:   11/21/22 1100 11/21/22 1121 11/21/22 1133 11/21/22 1613  BP: 127/74 128/62  (!) 108/57  Pulse: 77 77  77   Resp: 19 18  16   Temp:  97.8 F (36.6 C)  98.1 F (36.7 C)  TempSrc:  Oral  Oral  SpO2: 96% 95%  93%  Weight:   90.1 kg   Height:        Author: Loyce Dys, MD 11/21/2022 4:55 PM  For on call review [email or web address].

## 2022-11-21 NOTE — Progress Notes (Signed)
  Received patient in bed to unit.   Informed consent signed and in chart.    TX duration:3.15hrs      Transported back to floor  Hand-off given to patient's nurse. No c/o and no distress noted    Access used: L HD permcath Access issues: none   Total UF removed: 0.5kg Medication(s) given: 2gm Ancef  Post HD VS: 128/62 Post HD weight: 90.1kg     Lynann Beaver  [facility]

## 2022-11-21 NOTE — Progress Notes (Signed)
[facility]  ROUNDING NOTE   Subjective:   Cynthia Dean is a 72 y.o. 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 She presents to the ED with worsening lower extremity pain. Patient will be admitted for Back pain [M54.9] Bilateral leg pain [M79.604, M79.605]  Patient seen and evaluated during dialysis   HEMODIALYSIS FLOWSHEET:  Blood Flow Rate (mL/min): 350 mL/min Arterial Pressure (mmHg): -180 mmHg Venous Pressure (mmHg): 150 mmHg TMP (mmHg): 6 mmHg Ultrafiltration Rate (mL/min): 398 mL/min Dialysate Flow Rate (mL/min): 300 ml/min Dialysis Fluid Bolus: Normal Saline Bolus Amount (mL): 100 mL  No complaints at this time  Objective:  Vital signs in last 24 hours:  Temp:  [97.8 F (36.6 C)-98.9 F (37.2 C)] 97.8 F (36.6 C) (04/29 1121) Pulse Rate:  [68-77] 77 (04/29 1121) Resp:  [11-20] 18 (04/29 1121) BP: (112-159)/(57-76) 128/62 (04/29 1121) SpO2:  [91 %-100 %] 95 % (04/29 1121) Weight:  [90.1 kg-90.6 kg] 90.1 kg (04/29 1133)  Weight change:  Filed Weights   11/18/22 1213 11/21/22 0755 11/21/22 1133  Weight: 88.3 kg 90.6 kg 90.1 kg    Intake/Output: No intake/output data recorded.   Intake/Output this shift:  Total I/O In: -  Out: 500 [Other:500]  Physical Exam: General: NAD  Head: Normocephalic, atraumatic.  Moist oral mucosal membranes  Eyes: Anicteric  Lungs:  Clear to auscultation  Heart: Regular rate and rhythm  Abdomen:  Soft, nontender  Extremities: No peripheral edema.  Neurologic: Alert  Skin: No lesions  Access: Left AVG (no bruit/thrill)), left chest PermCath    Basic Metabolic Panel: Recent Labs  Lab 11/16/22 1508 11/17/22 0650 11/18/22 0445 11/19/22 0439 11/20/22 0502 11/21/22 0530  NA 136 136 135 137 136 137  K 4.3 3.8 4.0 3.8 3.8 4.0  CL 97* 100 99 99 99 99  CO2 27 27 28 29 27  26   GLUCOSE 161* 107* 134* 145* 148* 141*  BUN 40* 22 35* 22 35* 45*  CREATININE 7.49* 4.63* 6.09* 4.51* 6.25* 7.58*  CALCIUM 9.9 9.3 9.3 9.5 10.1 10.2  PHOS 5.9*  --   --   --   --   --      Liver Function Tests: Recent Labs  Lab 11/16/22 1508  ALBUMIN 2.4*    No results for input(s): "LIPASE", "AMYLASE" in the last 168 hours. No results for input(s): "AMMONIA" in the last 168 hours.  CBC: Recent Labs  Lab 11/16/22 1508 11/17/22 0650 11/18/22 0445 11/19/22 0439 11/21/22 0530  WBC 6.4 6.8 6.3 6.2 7.7  NEUTROABS  --  3.2 3.2 3.4 4.6  HGB 9.6* 9.0* 8.7* 9.0* 8.9*  HCT 31.5* 30.3* 29.0* 30.2* 29.5*  MCV 82.5 83.7 81.7 82.7 82.4  PLT 164 155 161 144* 168     Cardiac Enzymes: No results for input(s): "CKTOTAL", "CKMB", "CKMBINDEX", "TROPONINI" in the last 168 hours.  BNP: Invalid input(s): "POCBNP"  CBG: Recent Labs  Lab 11/19/22 2057  GLUCAP 131*     Microbiology: Results for orders placed or performed during the hospital encounter of 10/12/22  Blood culture (routine x 2)     Status: None   Collection Time: 10/12/22  5:40 PM   Specimen: BLOOD  Result Value Ref Range Status   Specimen Description BLOOD BLOOD RIGHT HAND [facility]  Final   Special Requests   Final    BOTTLES DRAWN AEROBIC AND ANAEROBIC Blood Culture results may  not be optimal due to an inadequate volume of blood received in culture bottles   Culture   Final    NO GROWTH 5 DAYS Performed at [facility], [address] [place], [place] [address]    Report Status 10/17/2022 FINAL  Final  Blood culture (routine x 2)     Status: None   Collection Time: 10/12/22  6:56 PM   Specimen: BLOOD  Result Value Ref Range Status   Specimen Description BLOOD BLOOD RIGHT FOREARM  Final   Special Requests   Final    BOTTLES DRAWN AEROBIC ONLY Blood Culture results may not be optimal due to an inadequate volume of blood received in culture bottles   Culture   Final    NO GROWTH 5 DAYS Performed at  [facility], [address] [place], [place] [address]    Report Status 10/17/2022 FINAL  Final  Aerobic/Anaerobic Culture w Gram Stain (surgical/deep wound)     Status: None   Collection Time: 10/13/22 11:44 AM   Specimen: Abscess  Result Value Ref Range Status   Specimen Description   Final    ABSCESS Performed at [facility], [address] [place], [place] [address]    Special Requests   Final    MUSCLE ABSCESS Performed at [facility], [address] [place], [place] [address]    Gram Stain   Final    ABUNDANT WBC PRESENT, PREDOMINANTLY PMN NO ORGANISMS SEEN    Culture   Final    FEW STAPHYLOCOCCUS CAPITIS NO ANAEROBES ISOLATED Performed at [facility], [address], [place], [place] [address]    Report Status 10/18/2022 FINAL  Final   Organism ID, Bacteria STAPHYLOCOCCUS CAPITIS  Final      Susceptibility   Staphylococcus capitis - MIC*    CIPROFLOXACIN <=0.5 SENSITIVE Sensitive     ERYTHROMYCIN >=8 RESISTANT Resistant     GENTAMICIN <=0.5 SENSITIVE Sensitive     OXACILLIN <=0.25 SENSITIVE Sensitive     TETRACYCLINE <=1 SENSITIVE Sensitive     VANCOMYCIN 1 SENSITIVE Sensitive     TRIMETH/SULFA <=10 SENSITIVE Sensitive     CLINDAMYCIN INTERMEDIATE Intermediate     RIFAMPIN <=0.5 SENSITIVE Sensitive     Inducible Clindamycin NEGATIVE Sensitive     * FEW STAPHYLOCOCCUS CAPITIS    Coagulation Studies: No results for input(s): "LABPROT", "INR" in the last 72 hours.  Urinalysis: No results for input(s): "COLORURINE", "LABSPEC", "PHURINE", "GLUCOSEU", "HGBUR", "BILIRUBINUR", "KETONESUR", "PROTEINUR", "UROBILINOGEN", "NITRITE", "LEUKOCYTESUR" in the last 72 hours.  Invalid input(s): "APPERANCEUR"    Imaging: No results found.   Medications:    sodium chloride Stopped (11/16/22 1155)   anticoagulant sodium citrate      ceFAZolin (ANCEF) IV 2 g (11/21/22 1012)    ceFAZolin (ANCEF) IV Stopped (11/16/22 2000)     ceFAZolin (ANCEF) IV Stopped (11/18/22 1204)   methocarbamol (ROBAXIN) IV Stopped (11/08/22 1455)    (feeding supplement) PROSource Plus  30 mL Oral BID BM   Chlorhexidine Gluconate Cloth  6 each Topical Q0600   cinacalcet  30 mg Oral Q supper   epoetin (EPOGEN/PROCRIT) injection  4,000 Units Intravenous Q M,W,F-HD   feeding supplement (NEPRO CARB STEADY)  237 mL Oral TID BM   fentaNYL  1 patch Transdermal Q72H   gabapentin  100 mg Oral Once per day on Mon Wed Fri   Gerhardt's butt cream   Topical QID   levETIRAcetam  1,000 mg Oral Daily   levETIRAcetam  250 mg Oral Q M,W,F   metoprolol succinate  100 mg Oral Daily   pantoprazole  20 mg Oral BID   psyllium  1 packet Oral Daily   QUEtiapine  25 mg Oral QHS   sertraline  25 mg Oral Daily   sodium chloride flush  10-40 mL Intracatheter Q12H   umeclidinium-vilanterol  1 puff Inhalation Daily   alteplase, anticoagulant sodium citrate, fentaNYL (SUBLIMAZE) injection, hydrALAZINE, lidocaine (PF), lidocaine-prilocaine, LORazepam, methocarbamol (ROBAXIN) IV, ondansetron, senna-docusate, sodium chloride flush  Assessment/ Plan:  [name] is a 72 y.o.  female with end stage renal disease on hemodialysis, coronary artery disease, COPD with intermittent home O2, hypertension, hyperlipidemia, stroke, diabetes mellitus type II, and breast cancer who was admitted to [facility] on 10/12/2022 for Back pain [M54.9] Bilateral leg pain [M79.604, M79.605]  Patient was found to have vertebral osteomyelitis and psoas abscess. Cultures positive for staph capitis  [facility] MWF Left AVG 102.5kg  End-stage renal disease on hemodialysis. on MWF schedule.    Receiving dialysis, UF goal 0.5L as tolerated. Next treatment scheduled for Wednesday.   Patient will need to sit in chair for outpatient dialysis. Would encourage staff to attempt short trials in recliner to prepare for this.   Anemia of chronic kidney disease Normocytic Lab  Results  Component Value Date   HGB 8.9 (L) 11/21/2022  Patient receives Mircera at outpatient clinic.  Continue EPO with dialysis treatments..   Secondary Hyperparathyroidism: with outpatient labs: PTH 180 (3/11), phosphorus 5.4, calcium 9.3   Lab Results  Component Value Date   PTH 131 (H) 03/14/2018   CALCIUM 10.2 11/21/2022   CAION 1.09 (L) 08/28/2022   PHOS 5.9 (H) 11/16/2022   -Continue Cinacalcet. -Bone minerals acceptable during this admission.   Hypotension during hemodialysis treatment.  Blood pressure 128/62 today.   Diabetes mellitus type II with chronic kidney disease/renal manifestations: insulin dependent, NPH.    Vertebral osteomyelitis, seen on CT lumber spine. Neurosurgery feels patient is not a surgical candidate CT guided aspiration of psoas abscess on 10/13/22. Cultures positive for Staph Capitis.   -Infectious disease recommending cefazolin with dialysis treatments. 8-week course in progress.    LOS: 40   4/29/20242:13 PM

## 2022-11-22 DIAGNOSIS — Z7189 Other specified counseling: Secondary | ICD-10-CM | POA: Diagnosis not present

## 2022-11-22 DIAGNOSIS — M4646 Discitis, unspecified, lumbar region: Secondary | ICD-10-CM | POA: Diagnosis not present

## 2022-11-22 LAB — CBC WITH DIFFERENTIAL/PLATELET
Abs Immature Granulocytes: 0.02 10*3/uL (ref 0.00–0.07)
Basophils Absolute: 0 10*3/uL (ref 0.0–0.1)
Basophils Relative: 1 %
Eosinophils Absolute: 0.1 10*3/uL (ref 0.0–0.5)
Eosinophils Relative: 2 %
HCT: 21.7 % — ABNORMAL LOW (ref 36.0–46.0)
Hemoglobin: 6.4 g/dL — ABNORMAL LOW (ref 12.0–15.0)
Immature Granulocytes: 0 %
Lymphocytes Relative: 32 %
Lymphs Abs: 1.6 10*3/uL (ref 0.7–4.0)
MCH: 24.9 pg — ABNORMAL LOW (ref 26.0–34.0)
MCHC: 29.5 g/dL — ABNORMAL LOW (ref 30.0–36.0)
MCV: 84.4 fL (ref 80.0–100.0)
Monocytes Absolute: 0.7 10*3/uL (ref 0.1–1.0)
Monocytes Relative: 13 %
Neutro Abs: 2.6 10*3/uL (ref 1.7–7.7)
Neutrophils Relative %: 52 %
Platelets: 127 10*3/uL — ABNORMAL LOW (ref 150–400)
RBC: 2.57 MIL/uL — ABNORMAL LOW (ref 3.87–5.11)
RDW: 19.9 % — ABNORMAL HIGH (ref 11.5–15.5)
WBC: 5 10*3/uL (ref 4.0–10.5)
nRBC: 0 % (ref 0.0–0.2)

## 2022-11-22 LAB — BASIC METABOLIC PANEL
Anion gap: 12 (ref 5–15)
BUN: 25 mg/dL — ABNORMAL HIGH (ref 8–23)
CO2: 24 mmol/L (ref 22–32)
Calcium: 9.5 mg/dL (ref 8.9–10.3)
Chloride: 97 mmol/L — ABNORMAL LOW (ref 98–111)
Creatinine, Ser: 5.03 mg/dL — ABNORMAL HIGH (ref 0.44–1.00)
GFR, Estimated: 9 mL/min — ABNORMAL LOW (ref >60)
Glucose, Bld: 101 mg/dL — ABNORMAL HIGH (ref 70–99)
Potassium: 3.7 mmol/L (ref 3.5–5.1)
Sodium: 133 mmol/L — ABNORMAL LOW (ref 135–145)

## 2022-11-22 NOTE — Progress Notes (Signed)
[facility]  ROUNDING NOTE   Subjective:   Cynthia Dean is a 72 y.o. 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 She presents to the ED with worsening lower extremity pain. Patient will be admitted for Back pain [M54.9] Bilateral leg pain [M79.604, M79.605]  Patient seen laying in bed Somnolent No family present No lower extremity edema   Objective:  Vital signs in last 24 hours:  Temp:  [97.8 F (36.6 C)-98.6 F (37 C)] 97.9 F (36.6 C) (04/30 0444) Pulse Rate:  [77-93] 78 (04/30 0444) Resp:  [16-19] 18 (04/30 0444) BP: (108-160)/(57-74) 160/70 (04/30 0444) SpO2:  [93 %-100 %] 96 % (04/30 0444) Weight:  [90.1 kg] 90.1 kg (04/29 1133)  Weight change:  Filed Weights   11/18/22 1213 11/21/22 0755 11/21/22 1133  Weight: 88.3 kg 90.6 kg 90.1 kg    Intake/Output: I/O last 3 completed shifts: In: 320 [P.O.:320] Out: 500 [Other:500]   Intake/Output this shift:  No intake/output data recorded.  Physical Exam: General: NAD  Head: Normocephalic, atraumatic.  Moist oral mucosal membranes  Eyes: Anicteric  Lungs:  Clear to auscultation  Heart: Regular rate and rhythm  Abdomen:  Soft, nontender  Extremities: No peripheral edema.  Neurologic: Somnolent  Skin: No lesions  Access: Left AVG (no bruit/thrill)), left chest PermCath    Basic Metabolic Panel: Recent Labs  Lab 11/16/22 1508 11/17/22 0650 11/18/22 0445 11/19/22 0439 11/20/22 0502 11/21/22 0530 11/22/22 0610  NA 136   < > 135 137 136 137 133*  K 4.3   < > 4.0 3.8 3.8 4.0 3.7  CL 97*   < > 99 99 99 99 97*  CO2 27   < > 28 29 27 26 24   GLUCOSE 161*   < > 134* 145* 148* 141* 101*  BUN 40*   < > 35* 22 35* 45* 25*  CREATININE 7.49*   < > 6.09* 4.51* 6.25* 7.58* 5.03*  CALCIUM 9.9   < > 9.3 9.5 10.1 10.2 9.5  PHOS 5.9*  --   --   --   --   --   --    < >  = values in this interval not displayed.     Liver Function Tests: Recent Labs  Lab 11/16/22 1508  ALBUMIN 2.4*    No results for input(s): "LIPASE", "AMYLASE" in the last 168 hours. No results for input(s): "AMMONIA" in the last 168 hours.  CBC: Recent Labs  Lab 11/17/22 0650 11/18/22 0445 11/19/22 0439 11/21/22 0530 11/22/22 0610  WBC 6.8 6.3 6.2 7.7 5.0  NEUTROABS 3.2 3.2 3.4 4.6 2.6  HGB 9.0* 8.7* 9.0* 8.9* 6.4*  HCT 30.3* 29.0* 30.2* 29.5* 21.7*  MCV 83.7 81.7 82.7 82.4 84.4  PLT 155 161 144* 168 127*     Cardiac Enzymes: No results for input(s): "CKTOTAL", "CKMB", "CKMBINDEX", "TROPONINI" in the last 168 hours.  BNP: Invalid input(s): "POCBNP"  CBG: Recent Labs  Lab 11/19/22 2057  GLUCAP 131*     Microbiology: Results for orders placed or performed during the hospital encounter of 10/12/22  Blood culture (routine x 2)     Status: None   Collection Time: 10/12/22  5:40 PM   Specimen: BLOOD  Result Value Ref Range Status   Specimen Description BLOOD BLOOD RIGHT HAND [facility]  Final   Special Requests   Final    BOTTLES DRAWN AEROBIC AND ANAEROBIC  Blood Culture results may not be optimal due to an inadequate volume of blood received in culture bottles   Culture   Final    NO GROWTH 5 DAYS Performed at [facility], [address] [place], [place] [address]    Report Status 10/17/2022 FINAL  Final  Blood culture (routine x 2)     Status: None   Collection Time: 10/12/22  6:56 PM   Specimen: BLOOD  Result Value Ref Range Status   Specimen Description BLOOD BLOOD RIGHT FOREARM  Final   Special Requests   Final    BOTTLES DRAWN AEROBIC ONLY Blood Culture results may not be optimal due to an inadequate volume of blood received in culture bottles   Culture   Final    NO GROWTH 5 DAYS Performed at [facility], [address] [place], [place] [address]    Report Status 10/17/2022 FINAL  Final  Aerobic/Anaerobic Culture w Gram Stain  (surgical/deep wound)     Status: None   Collection Time: 10/13/22 11:44 AM   Specimen: Abscess  Result Value Ref Range Status   Specimen Description   Final    ABSCESS Performed at [facility], [address] [place], [place] [address]    Special Requests   Final    MUSCLE ABSCESS Performed at [facility], [address] [place], [place] [address]    Gram Stain   Final    ABUNDANT WBC PRESENT, PREDOMINANTLY PMN NO ORGANISMS SEEN    Culture   Final    FEW STAPHYLOCOCCUS CAPITIS NO ANAEROBES ISOLATED Performed at [facility], [address] [place], [place] [address]    Report Status 10/18/2022 FINAL  Final   Organism ID, Bacteria STAPHYLOCOCCUS CAPITIS  Final      Susceptibility   Staphylococcus capitis - MIC*    CIPROFLOXACIN <=0.5 SENSITIVE Sensitive     ERYTHROMYCIN >=8 RESISTANT Resistant     GENTAMICIN <=0.5 SENSITIVE Sensitive     OXACILLIN <=0.25 SENSITIVE Sensitive     TETRACYCLINE <=1 SENSITIVE Sensitive     VANCOMYCIN 1 SENSITIVE Sensitive     TRIMETH/SULFA <=10 SENSITIVE Sensitive     CLINDAMYCIN INTERMEDIATE Intermediate     RIFAMPIN <=0.5 SENSITIVE Sensitive     Inducible Clindamycin NEGATIVE Sensitive     * FEW STAPHYLOCOCCUS CAPITIS    Coagulation Studies: No results for input(s): "LABPROT", "INR" in the last 72 hours.  Urinalysis: No results for input(s): "COLORURINE", "LABSPEC", "PHURINE", "GLUCOSEU", "HGBUR", "BILIRUBINUR", "KETONESUR", "PROTEINUR", "UROBILINOGEN", "NITRITE", "LEUKOCYTESUR" in the last 72 hours.  Invalid input(s): "APPERANCEUR"    Imaging: No results found.   Medications:    sodium chloride Stopped (11/16/22 1155)   anticoagulant sodium citrate      ceFAZolin (ANCEF) IV Stopped (11/21/22 1200)    ceFAZolin (ANCEF) IV Stopped (11/16/22 2000)    ceFAZolin (ANCEF) IV Stopped (11/18/22 1204)   methocarbamol (ROBAXIN) IV Stopped (11/08/22 1455)    (feeding supplement) PROSource Plus  30 mL Oral  BID BM   Chlorhexidine Gluconate Cloth  6 each Topical Q0600   cinacalcet  30 mg Oral Q supper   epoetin (EPOGEN/PROCRIT) injection  4,000 Units Intravenous Q M,W,F-HD   feeding supplement (NEPRO CARB STEADY)  237 mL Oral TID BM   fentaNYL  1 patch Transdermal Q72H   gabapentin  100 mg Oral Once per day on Mon Wed Fri   Gerhardt's butt cream   Topical QID   levETIRAcetam  1,000 mg Oral Daily  levETIRAcetam  250 mg Oral Q M,W,F   metoprolol succinate  100 mg Oral Daily   pantoprazole  20 mg Oral BID   psyllium  1 packet Oral Daily   QUEtiapine  25 mg Oral QHS   sertraline  25 mg Oral Daily   sodium chloride flush  10-40 mL Intracatheter Q12H   umeclidinium-vilanterol  1 puff Inhalation Daily   alteplase, anticoagulant sodium citrate, fentaNYL (SUBLIMAZE) injection, hydrALAZINE, lidocaine (PF), lidocaine-prilocaine, LORazepam, methocarbamol (ROBAXIN) IV, ondansetron, senna-docusate, sodium chloride flush  Assessment/ Plan:  [name] is a 72 y.o.  female with end stage renal disease on hemodialysis, coronary artery disease, COPD with intermittent home O2, hypertension, hyperlipidemia, stroke, diabetes mellitus type II, and breast cancer who was admitted to [facility] on 10/12/2022 for Back pain [M54.9] Bilateral leg pain [M79.604, M79.605]  Patient was found to have vertebral osteomyelitis and psoas abscess. Cultures positive for staph capitis  [facility]  MWF Left AVG 102.5kg  End-stage renal disease on hemodialysis. on MWF schedule.    Next treatment scheduled for Wednesday.   Patient will need to sit in chair for outpatient dialysis. Would encourage staff to attempt short trials in recliner to prepare for this.   Anemia of chronic kidney disease Normocytic Lab Results  Component Value Date   HGB 6.4 (L) 11/22/2022  Patient receives Mircera at outpatient clinic.  Hgb decreased.  Patient receives EPO with dialysis. Will defer need for blood transfusion to  primary team.   Secondary Hyperparathyroidism: with outpatient labs: PTH 180 (3/11), phosphorus 5.4, calcium 9.3   Lab Results  Component Value Date   PTH 131 (H) 03/14/2018   CALCIUM 9.5 11/22/2022   CAION 1.09 (L) 08/28/2022   PHOS 5.9 (H) 11/16/2022   -Continue Cinacalcet. -Bone minerals acceptable during this admission.   Hypotension during hemodialysis treatment.  Blood pressure elevated today, 160/70  Diabetes mellitus type II with chronic kidney disease/renal manifestations: insulin dependent, NPH.    Vertebral osteomyelitis, seen on CT lumber spine. Neurosurgery feels patient is not a surgical candidate CT guided aspiration of psoas abscess on 10/13/22. Cultures positive for Staph Capitis.   -Infectious disease recommending cefazolin with dialysis treatments in progress. With end date of 12/02/22.    LOS: 41   4/30/202411:12 AM

## 2022-11-22 NOTE — Progress Notes (Signed)
Progress Note   [name] WUJ:[id number] DOB: 03/16/1951 DOA: 10/12/2022     41 DOS: the patient was seen and examined on 11/22/2022   Subjective: Patient was seen on rounds this morning I asked her wishes for dialysis and she tells me to talk to Cynthia Dean her daughter Denies chest pain nausea vomiting abdominal pain     brief hospital course:   Cynthia Dean is a 72 y/o F w/ PMH of ESRD on HD, COPD, DM2, HTN, depression, CVA who presented w/ back pain x 3 weeks.  Pt presented w/ back pain and was found to intradiscal abscess/lumbar discitis/b/l psoas abscess on MRI status post IR guided drainage. Patient has had waxing and waning mental status with intermittently refusing dialysis. Patient's family have decided to continue hemodialysis after speaking with their PCP. Hospital course prolonged, complicated by patient's inability to tolerate sitting in recliner chair for dialysis.  She only agrees to dialysis in the bed.  This is a barrier to discharge as no outpatient dialysis centers can accommodate.         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   Wound cx growing staph capitis.   ID consulted.  Abx de-escalated to cefazolin.   Not a good surgical candidate as per neuro surg.   Pt having severe low back and bilateral leg pain --cont cefazolin, for 6 weeks, End Date: 12/02/22 --pain management per orders -- palliative care following --transitioned from fentanyl patch + IV PRN >>> scheduled + PRN Norco on 4/17, since staff have not been using PRN fentanyl doses apparently for concern of side effects and/or hypotension.  patient cannot tolerate any position except laying nearly flat in the bed     Confusion and agitation Hospital delirium --Pt was agitated,  and also not responding to questions or commands during early hospitalization.  Mental status did start to improve, and now pt is calm and interactive, however, at this time she has been having waxing and waning mental status --Delirium precautions --cont seroquel 50 mg nightly to help with delirium and sleep.     ESRD: on HD MWF.  Pt refused HD on 10/17/22, 10/18/22 and 3/27, 4/1.   Since then, has been agreeable to dialysis without further issues, getting dialysis in hospital bed. When attempts made to do HD in chair, she did not tolerate on account of severe back pain --Recommend consideration for home hemodialysis if no facility capable to do HD in the bed 4/18 --HD access / fistula clotted off during dialysis 4/19 --pt taken for procedure to address fistula malfunction, but was agitated and combative, resfused procedure.   4/20 --due to dialysis, no access at this time.  Afternoon - vascular placed R femoral temp HD cath placed   Dialysis resumed.  Patient underwent PermCath placement 4/24      Generalized weakness / Physical Debility:  PT recs SNF. TOC following. Pt is bedridden and very high risk for developing pressure injuries of skin -- reposition frequently.   Monitor pressures areas on skin and offload as possible.   Severe back and bilateral leg pain due to severe multi-level lumbar degenerative disc disease. Reviewed Lumbar MRI findings with family.  Pt has bulging discs at L2-3, L3-4, L4-5 and L5-S1. Pt will continue to have back and  leg pain once antibiotics are complete. --Pain mgmt per orders  --Pain control complicated by AMS and hypotension with escalation   ACD:  likely secondary ESRD. H&H are stable    Hypokalemia:  4/8: K 3.6 --monitor and replete PRN    Peripheral neuropathy --  4/4 - Cautious trial of gabapentin 100 mg daily (ESRD - monitor closely for side effects) 4% - Pt notes significant improvement and no side effects so far --Continue low dose  gabapentin and monitor   Hx of seizures:  continue on home dose of Keppra   HLD:  continue on statin    Hx of COPD: w/o exacerbation.  Continue on bronchodilators    DM2: well controlled HbA1c 5.2.  --d/c'ed BG checks    HTN:  Antihypertensives as blood pressure allows   GERD:  continue on PPI    Depression: severity unknown.  Continue on home dose of sertraline     Overweight: BMI 28.2.  Wt loss efforts recommended   Right foot pain - see peripheral neuropathy     DVT prophylaxis: Heparin SQ  Code Status: Full code      Family Communication: I discussed with patient's daughter Cynthia Dean over the phone yesterday and she continues to have discussing with family and will let me know what the rest of the family decides.  I tried again today however I was unable to get her and so I left a voice message.     11/15/22 - POA daughter Cynthia Dean states no other family member besides herself and patient's Cynthia Dean should be given any updates on status or plan of care going forward   Laboratory data reviewed: I personally reviewed laboratory data showing  Sodium 133 potassium 3.7 creatinine 5.03   Consultants - Nephrology, Infectious Disease, Palliative Care   Level of care: Telemetry Medical   Dispo:   The patient is from: home Anticipated d/c is to: SNF  Anticipated d/c date is: TBD pending GOC decisions   Physical Exam:   General exam: awake alert, no acute distress Respiratory system: on room air, normal respiratory effort. Cardiovascular system: RRR, no peripheral LE edema Gastrointestinal system: soft, NT, ND  Central nervous system: normal speech no gross focal neurologic deficits Extremities:  LUE fistula noted, R femoral temp dialysis catheter in place Skin: dry, intact, normal temperature Psychiatry: normal mood, congruent affect     Time spent: 41 minutes      Vitals:   11/21/22 1613 11/21/22 1837 11/21/22 1934 11/22/22 0444  BP: (!) 108/57 136/68 (!)  144/74 (!) 160/70  Pulse: 77 81 93 78  Resp: 16 16 19 18   Temp: 98.1 F (36.7 C) 98.3 F (36.8 C) 98.6 F (37 C) 97.9 F (36.6 C)  TempSrc: Oral     SpO2: 93% 95% 100% 96%  Weight:      Height:       Author: Loyce Dys, MD 11/22/2022 2:44 PM  For on call review [email or web address].

## 2022-11-22 NOTE — TOC Progression Note (Signed)
Transition of Care [facility]) - Progression Note    Patient Details  Name: Cynthia Dean MRN: [id number] Date of Birth: 1950-09-15  Transition of Care [facility]) CM/SW Contact  Allena Katz, LCSW Phone Number: 11/22/2022, 10:17 AM  Clinical Narrative:   TOC continuing to follow. Pt still unable to tolerate dialysis unless in a reclined position. Pt unable to tolerate seated dialysis and will need to complete this to discharge successfully.           Expected Discharge Plan and Services                                               Social Determinants of Health (SDOH) Interventions SDOH Screenings   Food Insecurity: No Food Insecurity (10/12/2022)  Housing: Low Risk  (10/12/2022)  Transportation Needs: No Transportation Needs (10/12/2022)  Utilities: Not At Risk (10/12/2022)  Financial Resource Strain: Low Risk  (11/12/2017)  Physical Activity: Insufficiently Active (11/12/2017)  Social Connections: Moderately Integrated (11/12/2017)  Stress: No Stress Concern Present (11/12/2017)  Tobacco Use: Medium Risk (11/16/2022)    Readmission Risk Interventions     No data to display

## 2022-11-22 NOTE — Progress Notes (Signed)
Daily Progress Note   Patient Name: Cynthia Dean       Date: 11/22/2022 DOB: Mar 12, 1951  Age: 72 y.o. MRN#: [id number] Attending Physician: Loyce Dys, MD Primary Care Physician: Loistine Chance, MD Admit Date: 10/12/2022  Reason for Consultation/Follow-up: Establishing goals of care  Subjective: Notes and labs reviewed.  In to see patient.  No family at bedside.  Patient does not answer any questions.   Patient answers "mmhmm" without opening her mouth when asked if she is doing okay, and if she wants to continue dialysis.   Attempted to call [name] unsuccessfully.  Previous conversations have been had regarding various scenarios and the future.  Conversations have also included discussion on quality of life, pain and suffering.  Length of Stay: 41  Current Medications: Scheduled Meds:   (feeding supplement) PROSource Plus  30 mL Oral BID BM   Chlorhexidine Gluconate Cloth  6 each Topical Q0600   cinacalcet  30 mg Oral Q supper   epoetin (EPOGEN/PROCRIT) injection  4,000 Units Intravenous Q M,W,F-HD   feeding supplement (NEPRO CARB STEADY)  237 mL Oral TID BM   fentaNYL  1 patch Transdermal Q72H   gabapentin  100 mg Oral Once per day on Mon Wed Fri   Gerhardt's butt cream   Topical QID   levETIRAcetam  1,000 mg Oral Daily   levETIRAcetam  250 mg Oral Q M,W,F   metoprolol succinate  100 mg Oral Daily   pantoprazole  20 mg Oral BID   psyllium  1 packet Oral Daily   QUEtiapine  25 mg Oral QHS   sertraline  25 mg Oral Daily   sodium chloride flush  10-40 mL Intracatheter Q12H   umeclidinium-vilanterol  1 puff Inhalation Daily    Continuous Infusions:  sodium chloride Stopped (11/16/22 1155)   anticoagulant sodium citrate      ceFAZolin (ANCEF) IV Stopped  (11/21/22 1200)    ceFAZolin (ANCEF) IV Stopped (11/16/22 2000)    ceFAZolin (ANCEF) IV Stopped (11/18/22 1204)   methocarbamol (ROBAXIN) IV Stopped (11/08/22 1455)    PRN Meds: alteplase, anticoagulant sodium citrate, fentaNYL (SUBLIMAZE) injection, hydrALAZINE, lidocaine (PF), lidocaine-prilocaine, LORazepam, methocarbamol (ROBAXIN) IV, ondansetron, senna-docusate, sodium chloride flush  Physical Exam Pulmonary:     Effort: Pulmonary effort is normal.  Neurological:     Mental  Status: She is alert.             Vital Signs: BP (!) 160/70 (BP Location: Right Arm)   Pulse 78   Temp 97.9 F (36.6 C)   Resp 18   Ht 6' (1.829 m)   Wt 90.1 kg   SpO2 96%   BMI 26.94 kg/m  SpO2: SpO2: 96 % O2 Device: O2 Device: Room Air O2 Flow Rate: O2 Flow Rate (L/min): 2 L/min  Intake/output summary:  Intake/Output Summary (Last 24 hours) at 11/22/2022 1233 Last data filed at 11/22/2022 1035 Gross per 24 hour  Intake 320 ml  Output --  Net 320 ml   LBM: Last BM Date : 11/17/22 Baseline Weight: Weight: 113.4 kg Most recent weight: Weight: 90.1 kg   Patient Active Problem List   Diagnosis Date Noted   Psoas abscess (HCC) 10/17/2022   Back pain 10/12/2022   Discitis of lumbar region 10/12/2022   Myositis 10/12/2022   Hemoglobin drop 08/28/2022   Chronic respiratory failure with hypoxia (HCC) 08/26/2022   Acute on chronic blood loss anemia 08/26/2022   History of lower GI bleeding 08/26/2022   Insulin-requiring or dependent type II diabetes mellitus (HCC) 08/26/2022   Chronic anticoagulation 08/26/2022   History of pulmonary embolism 03/20/2022 08/26/2022   Breast cancer, left S/P partial mastectomy,03/04/2022 08/26/2022   Generalized weakness    Unable to ambulate 05/09/2022   ESRD on dialysis [facility])    Bilateral leg pain    Depression    Pulmonary emboli (HCC)    Obesity with body mass index (BMI) of 30.0 to 39.9    Anemia    Chronic diastolic CHF (congestive heart failure)  (HCC)    Seroma of breast    Bacteremia due to coagulase-negative Staphylococcus    Left breast abscess    Urinary tract infection 03/22/2022   Anemia of chronic disease 03/22/2022   Acute pulmonary embolism without acute cor pulmonale (HCC) 03/20/2022   Dyslipidemia 03/20/2022   Diabetic neuropathy (HCC) 03/20/2022   Sepsis due to cellulitis (HCC) 03/20/2022   Delay kidney tx func d/t fluid overload requiring acute dialysis (HCC) 01/29/2020   Delay kidney tx func d/t ATN and fluid overload require acute dialysis (HCC) 01/29/2020   Hypoglycemia 10/21/2019   Unresponsiveness 10/21/2019   GERD (gastroesophageal reflux disease) 10/21/2019   Hypothermia    Sepsis with encephalopathy without septic shock (HCC)    Diarrhea    Acute metabolic encephalopathy    Acute on chronic diastolic CHF (congestive heart failure) (HCC)    Acute on chronic respiratory failure with hypoxia (HCC) 07/30/2019   Rectal bleed 02/13/2019   Acute respiratory failure with hypoxia (HCC) 12/31/2018   History of CVA (cerebrovascular accident) 05/29/2018   Aphasia as late effect of cerebrovascular accident (CVA) 05/01/2018   Seizure disorder (HCC) 03/14/2018   Stroke (HCC) 03/07/2018   Slurred speech 11/14/2017   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) 11/12/2017   CAD (coronary artery disease) 11/12/2017   COPD (chronic obstructive pulmonary disease) (HCC) 11/12/2017   ESRD on hemodialysis (HCC) 11/12/2017   CVA (cerebral vascular accident) (HCC) 04/14/2016   Essential hypertension 11/27/2015   Hyperlipidemia 11/27/2015   Prolonged Q-T interval on ECG 11/26/2015   Aphasia complicating stroke 11/05/2015   Cervical spine arthritis 07/27/2015   Diabetic retinopathy without macular edema associated with type 2 diabetes mellitus (HCC) 07/27/2015   Osteoarthritis of spine with radiculopathy, lumbar region 07/27/2015   Obesity, Class III, BMI 40-49.9 (morbid  obesity) (HCC) 07/27/2015    Vitamin D deficiency 01/21/2011   Diastolic heart failure (HCC) 10/27/2010    Palliative Care Assessment & Plan    Recommendations/Plan:  Continue care.  Patient no longer converses with this Clinical research associate.    Unable to reach daughter [name], who has been working with primary team to make decisions.    PMT will shadow as patient does not engage in conversation, and family's desire is to continue dialysis outpatient.   Code Status:    Code Status Orders  (From admission, onward)           Start     Ordered   11/01/22 1535  Do not attempt resuscitation (DNR)  Continuous       Question Answer Comment  If patient has no pulse and is not breathing Do Not Attempt Resuscitation   If patient has a pulse and/or is breathing: Medical Treatment Goals COMFORT MEASURES: Keep clean/warm/dry, use medication by any route; positioning, wound care and other measures to relieve pain/suffering; use oxygen, suction/manual treatment of airway obstruction for comfort; do not transfer unless for comfort needs.   Consent: Discussion documented in EHR or advanced directives reviewed      11/01/22 1534           Code Status History     Date Active Date Inactive Code Status Order ID Comments User Context   10/12/2022 1101 11/01/2022 1534 Full Code [id number]  Charise Killian, MD Inpatient   08/26/2022 2018 09/09/2022 2244 Full Code [id number]  Andris Baumann, MD ED   05/09/2022 0919 05/17/2022 0109 Full Code [id number]  Lorretta Harp, MD ED   03/20/2022 0121 04/02/2022 0126 Full Code [id number]  Mansy, Vernetta Honey, MD ED   01/29/2020 2038 01/30/2020 2215 Full Code [id number]  Lucile Shutters, MD Inpatient   01/29/2020 1210 01/29/2020 2038 Full Code [id number]  Lucile Shutters, MD Inpatient   10/21/2019 0902 10/25/2019 2211 Full Code [id number]  Lorretta Harp, MD ED   07/30/2019 2224 08/11/2019 2130 Full Code [id number]  Mansy, Vernetta Honey, MD ED   02/13/2019 1329 02/16/2019 1849 Full Code [id number]  Jimmye Norman, NP ED   12/31/2018  1159 01/02/2019 1330 Full Code [id number]  Jimmye Norman, NP ED   05/29/2018 1541 05/30/2018 2041 Full Code [id number]  Alford Highland, MD ED   03/14/2018 0141 03/15/2018 0336 Full Code [id number]  Cammy Copa, MD Inpatient   03/08/2018 0005 03/09/2018 2108 Full Code [id number]  Altamese Dilling, MD Inpatient   11/15/2017 0017 11/17/2017 1809 Full Code [id number]  Oralia Manis, MD ED   11/12/2017 2219 11/13/2017 1935 Full Code [id number]  Oralia Manis, MD ED   04/14/2016 1330 04/14/2016 1700 Full Code [id number]  Adrian Saran, MD ED   08/08/2015 2257 08/09/2015 2054 Full Code [id number]  Wyatt Haste, MD ED   04/17/2015 1334 04/17/2015 1836 Full Code [id number]  Annice Needy, MD Inpatient      Advance Directive Documentation    Flowsheet Row Most Recent Value  Type of Advance Directive Healthcare Power of Attorney  Pre-existing out of facility DNR order (yellow form or pink MOST form) --  "MOST" Form in Place? --       Prognosis: Poor overall   Thank you for allowing the Palliative Medicine Team to assist in the care of this patient.    Morton Stall, NP  Please contact Palliative Medicine Team phone at [phone] for questions and concerns.

## 2022-11-23 DIAGNOSIS — M79604 Pain in right leg: Secondary | ICD-10-CM | POA: Diagnosis not present

## 2022-11-23 DIAGNOSIS — E1142 Type 2 diabetes mellitus with diabetic polyneuropathy: Secondary | ICD-10-CM

## 2022-11-23 DIAGNOSIS — G40909 Epilepsy, unspecified, not intractable, without status epilepticus: Secondary | ICD-10-CM

## 2022-11-23 DIAGNOSIS — E119 Type 2 diabetes mellitus without complications: Secondary | ICD-10-CM

## 2022-11-23 DIAGNOSIS — I10 Essential (primary) hypertension: Secondary | ICD-10-CM

## 2022-11-23 DIAGNOSIS — K219 Gastro-esophageal reflux disease without esophagitis: Secondary | ICD-10-CM

## 2022-11-23 DIAGNOSIS — M6008 Infective myositis, other site: Secondary | ICD-10-CM

## 2022-11-23 DIAGNOSIS — M5442 Lumbago with sciatica, left side: Secondary | ICD-10-CM | POA: Diagnosis not present

## 2022-11-23 DIAGNOSIS — D649 Anemia, unspecified: Secondary | ICD-10-CM | POA: Diagnosis not present

## 2022-11-23 DIAGNOSIS — E785 Hyperlipidemia, unspecified: Secondary | ICD-10-CM

## 2022-11-23 DIAGNOSIS — E11319 Type 2 diabetes mellitus with unspecified diabetic retinopathy without macular edema: Secondary | ICD-10-CM

## 2022-11-23 LAB — CBC WITH DIFFERENTIAL/PLATELET
Abs Immature Granulocytes: 0.04 10*3/uL (ref 0.00–0.07)
Basophils Absolute: 0.1 10*3/uL (ref 0.0–0.1)
Basophils Relative: 1 %
Eosinophils Absolute: 0.2 10*3/uL (ref 0.0–0.5)
Eosinophils Relative: 2 %
HCT: 31.4 % — ABNORMAL LOW (ref 36.0–46.0)
Hemoglobin: 9.3 g/dL — ABNORMAL LOW (ref 12.0–15.0)
Immature Granulocytes: 1 %
Lymphocytes Relative: 39 %
Lymphs Abs: 2.7 10*3/uL (ref 0.7–4.0)
MCH: 24.4 pg — ABNORMAL LOW (ref 26.0–34.0)
MCHC: 29.6 g/dL — ABNORMAL LOW (ref 30.0–36.0)
MCV: 82.4 fL (ref 80.0–100.0)
Monocytes Absolute: 0.9 10*3/uL (ref 0.1–1.0)
Monocytes Relative: 13 %
Neutro Abs: 3.1 10*3/uL (ref 1.7–7.7)
Neutrophils Relative %: 44 %
Platelets: 189 10*3/uL (ref 150–400)
RBC: 3.81 MIL/uL — ABNORMAL LOW (ref 3.87–5.11)
RDW: 19.6 % — ABNORMAL HIGH (ref 11.5–15.5)
WBC: 7 10*3/uL (ref 4.0–10.5)
nRBC: 0 % (ref 0.0–0.2)

## 2022-11-23 LAB — BASIC METABOLIC PANEL
Anion gap: 11 (ref 5–15)
BUN: 37 mg/dL — ABNORMAL HIGH (ref 8–23)
CO2: 25 mmol/L (ref 22–32)
Calcium: 10 mg/dL (ref 8.9–10.3)
Chloride: 97 mmol/L — ABNORMAL LOW (ref 98–111)
Creatinine, Ser: 6.63 mg/dL — ABNORMAL HIGH (ref 0.44–1.00)
GFR, Estimated: 6 mL/min — ABNORMAL LOW (ref >60)
Glucose, Bld: 133 mg/dL — ABNORMAL HIGH (ref 70–99)
Potassium: 4.5 mmol/L (ref 3.5–5.1)
Sodium: 133 mmol/L — ABNORMAL LOW (ref 135–145)

## 2022-11-23 MED ORDER — EPOETIN ALFA 4000 UNIT/ML IJ SOLN
INTRAMUSCULAR | Status: AC
  Filled 2022-11-23: qty 1

## 2022-11-23 NOTE — Progress Notes (Signed)
PROGRESS NOTE    Cynthia Dean   ZOX:[id number] DOB: 1951/01/21  DOA: 10/12/2022 Date of Service: 11/23/22 PCP: Loistine Chance, MD     Brief Narrative / Hospital Course:  Cynthia Dean is a 72 y.o. 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 She presents to the ED with worsening lower extremity pain. Admitted for possible Discitis/osteomyelitis at L4-L5: w/ suspected intradiscal abscess as per MRI. 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   4/18, HD access / fistula clotted off during dialysis. 4/19 pt taken for procedure to address fistula malfunction, but was agitated and combative, resfused procedure. 4/19 vascular placed R femoral temp HD cath placed, resumed HD and pemcath placed 4/24  Patient has had waxing and waning mental status with intermittently refusing dialysis. Patient's family have decided to continue hemodialysis after speaking with their PCP. Hospital course prolonged, complicated by patient's inability to tolerate sitting in recliner chair for dialysis.  She only agrees to dialysis in the bed.  This is a barrier to discharge as no outpatient dialysis centers can accommodate.      Consultants:  Neurosurgery Infectious Disease Vascular surgery  Nephrology   Procedures: 09/22/22: Stent placement to the AV graft  10/13/22: CT aspiration of right psoas muscle abscess yielding 2 ml purulent material  11/11/22: R Femoral V catheter placement 11/16/22: Permcath placement to L IJ       ASSESSMENT & PLAN:   Principal Problem:   Back pain Active Problems:   Anemia   Bilateral leg pain   ESRD on dialysis [facility])   Essential hypertension   Dyslipidemia   Seizure disorder (HCC)   Diabetic retinopathy without macular edema associated with type 2 diabetes mellitus  (HCC)   GERD (gastroesophageal reflux disease)   Diabetic neuropathy (HCC)   Anemia of chronic disease   Insulin-requiring or dependent type II diabetes mellitus (HCC)   Discitis of lumbar region   Myositis   Psoas abscess (HCC)   Lumbar discitis, 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   Wound cx growing staph capitis.   ID consulted.  Abx de-escalated to cefazolin.   Not a good surgical candidate as per neuro surg.   Pt having severe low back and bilateral leg pain cont cefazolin, for 6 weeks, End Date: 12/02/22 pain management per orders -- palliative care following transitioned from fentanyl patch + IV PRN >>> scheduled + PRN Norco on 4/17, since staff have not been using PRN fentanyl doses apparently for concern of side effects and/or hypotension.  patient cannot tolerate any position except laying nearly flat in the bed     Confusion and agitation Hospital delirium Pt was agitated, and also not responding to questions or commands during early hospitalization.  Mental status did start to improve, and now pt is calm and interactive, however, at this time she has been having waxing and waning mental status Delirium precautions cont seroquel 50 mg nightly to help with delirium and sleep.     ESRD: on HD MWF.  Pt refused HD on 10/17/22, 10/18/22 and 3/27, 4/1.   Since then, has been agreeable to dialysis without further issues, getting dialysis in hospital bed. When attempts made to do HD in chair, she  did not tolerate on account of severe back pain Recommend consideration for home hemodialysis if no facility capable to do HD in the bed   Generalized weakness / Physical Debility:  PT recs SNF. TOC following. Pt is bedridden and very high risk for developing pressure injuries of skin -- reposition frequently.   Monitor pressures  areas on skin and offload as possible.   Severe back and bilateral leg pain due to severe multi-level lumbar degenerative disc disease. Pt has bulging discs at L2-3, L3-4, L4-5 and L5-S1. Pt will continue to have back and leg pain once antibiotics are complete. Pain mgmt per orders  Pain control complicated by AMS and hypotension with escalation   ACD:  likely secondary ESRD. H&H are stable  Monitor periodic CBC   Hypokalemia Replace as needed Monitor BMP   Peripheral neuropathy --  Continue low dose gabapentin and monitor   Hx of seizures:  continue on home dose of Keppra   HLD:  continue on statin    Hx of COPD: w/o exacerbation.  Continue on bronchodilators    DM2: well controlled HbA1c 5.2.  d/c'ed BG checks    HTN:  Antihypertensives as blood pressure allows   GERD:  continue on PPI    Depression: severity unknown.  Continue on home dose of sertraline     Overweight: BMI 28.2.  Wt loss efforts recommended   Right foot pain  see peripheral neuropathy   DVT prophylaxis: heparin sq Pertinent IV fluids/nutrition: no continuous IV fluids  Central lines / invasive devices: L IJ HD cath, port R chest   Code Status: DNR ACP documentation reviewed: 05/01 - MOST and advanced directive reviewed, [name] is HCPOA  Current Admission Status: inpatient  TOC needs / Dispo plan: SNF Barriers to discharge / significant pending items: unable to sit to toelrate outpatient HD, working w/ PT              Subjective / Brief ROS:  Patient reports no concerns but is not particularly communicative, can relay her stomache aches, thinks maybe gas.  Denies CP/SOB.  Tolerating diet - support person is at bedside hand feeding her.     Family Communication: support person at bedside on rounds     Objective Findings:  Vitals:   11/23/22 1100 11/23/22 1130 11/23/22 1157 11/23/22 1249  BP: 99/60 112/61  (!) 142/97  Pulse: 82 80  85  Resp: 18 17  16    Temp:  98.8 F (37.1 C)  98.8 F (37.1 C)  TempSrc:  Oral    SpO2: 99% 99%  100%  Weight:   88.8 kg   Height:        Intake/Output Summary (Last 24 hours) at 11/23/2022 1452 Last data filed at 11/23/2022 1432 Gross per 24 hour  Intake 360 ml  Output 0 ml  Net 360 ml   Filed Weights   11/21/22 1133 11/23/22 0752 11/23/22 1157  Weight: 90.1 kg 89.2 kg 88.8 kg    Examination:  Physical Exam Constitutional:      General: She is not in acute distress.    Appearance: She is obese.  Cardiovascular:     Rate and Rhythm: Normal rate and regular rhythm.     Heart sounds: Normal heart sounds.  Pulmonary:     Effort: Pulmonary effort is normal. No respiratory distress.     Breath sounds: Normal breath sounds.  Abdominal:     General: Bowel sounds are normal. There is no distension.  Palpations: Abdomen is soft.     Tenderness: There is no abdominal tenderness.  Musculoskeletal:     Right lower leg: No edema.     Left lower leg: No edema.  Skin:    General: Skin is dry.  Neurological:     General: No focal deficit present.     Mental Status: She is alert. Mental status is at baseline.  Psychiatric:        Behavior: Behavior normal.          Scheduled Medications:   (feeding supplement) PROSource Plus  30 mL Oral BID BM   Chlorhexidine Gluconate Cloth  6 each Topical Q0600   cinacalcet  30 mg Oral Q supper   epoetin (EPOGEN/PROCRIT) injection  4,000 Units Intravenous Q M,W,F-HD   feeding supplement (NEPRO CARB STEADY)  237 mL Oral TID BM   fentaNYL  1 patch Transdermal Q72H   gabapentin  100 mg Oral Once per day on Mon Wed Fri   Gerhardt's butt cream   Topical QID   levETIRAcetam  1,000 mg Oral Daily   levETIRAcetam  250 mg Oral Q M,W,F   metoprolol succinate  100 mg Oral Daily   pantoprazole  20 mg Oral BID   psyllium  1 packet Oral Daily   QUEtiapine  25 mg Oral QHS   sertraline  25 mg Oral Daily   sodium chloride flush  10-40 mL Intracatheter Q12H    umeclidinium-vilanterol  1 puff Inhalation Daily    Continuous Infusions:  sodium chloride Stopped (11/16/22 1155)   anticoagulant sodium citrate      ceFAZolin (ANCEF) IV Stopped (11/21/22 1200)    ceFAZolin (ANCEF) IV Stopped (11/23/22 1121)    ceFAZolin (ANCEF) IV Stopped (11/18/22 1204)   methocarbamol (ROBAXIN) IV Stopped (11/08/22 1455)    PRN Medications:  alteplase, anticoagulant sodium citrate, fentaNYL (SUBLIMAZE) injection, hydrALAZINE, lidocaine (PF), lidocaine-prilocaine, LORazepam, methocarbamol (ROBAXIN) IV, ondansetron, senna-docusate, sodium chloride flush  Antimicrobials from admission:  Anti-infectives (From admission, onward)    Start     Dose/Rate Route Frequency Ordered Stop   11/18/22 1200  ceFAZolin (ANCEF) IVPB 3g/100 mL premix        3 g 200 mL/hr over 30 Minutes Intravenous Every Fri (Hemodialysis) 11/13/22 1039     11/17/22 0000  ceFAZolin (ANCEF) IVPB 1 g/50 mL premix       Note to Pharmacy: Send with pt to OR   1 g 100 mL/hr over 30 Minutes Intravenous On call 11/16/22 1154 11/16/22 1500   11/16/22 1200  ceFAZolin (ANCEF) IVPB 2g/100 mL premix        2 g 200 mL/hr over 30 Minutes Intravenous Every Wed (Hemodialysis) 11/13/22 1039     11/14/22 1200  ceFAZolin (ANCEF) IVPB 2g/100 mL premix        2 g 200 mL/hr over 30 Minutes Intravenous Every Mon (Hemodialysis) 11/13/22 1039     11/11/22 2200  ceFAZolin (ANCEF) IVPB 1 g/50 mL premix        1 g 100 mL/hr over 30 Minutes Intravenous Daily at 10 pm 11/10/22 1655 11/13/22 2320   11/09/22 1200  ceFAZolin (ANCEF) IVPB 2g/100 mL premix  Status:  Discontinued        2 g 200 mL/hr over 30 Minutes Intravenous Every Wed (Hemodialysis) 11/02/22 1246 11/03/22 1111   11/07/22 1700  ceFAZolin (ANCEF) IVPB 2g/100 mL premix        2 g 200 mL/hr over 30 Minutes Intravenous  Once 11/07/22 1610 11/07/22 1827  11/07/22 1200  ceFAZolin (ANCEF) IVPB 2g/100 mL premix  Status:  Discontinued        2 g 200 mL/hr over 30  Minutes Intravenous Every Mon (Hemodialysis) 11/02/22 1246 11/10/22 1655   11/04/22 1200  ceFAZolin (ANCEF) IVPB 3g/100 mL premix  Status:  Discontinued        3 g 200 mL/hr over 30 Minutes Intravenous Every Fri (Hemodialysis) 11/02/22 1246 11/10/22 1655   11/02/22 2200  ceFAZolin (ANCEF) IVPB 1 g/50 mL premix  Status:  Discontinued        1 g 100 mL/hr over 30 Minutes Intravenous Daily at bedtime 11/01/22 1452 11/02/22 1246   11/02/22 1330  ceFAZolin (ANCEF) IVPB 2g/100 mL premix  Status:  Discontinued        2 g 200 mL/hr over 30 Minutes Intravenous Every Wed (Hemodialysis) 11/02/22 1320 11/10/22 1655   11/02/22 1200  ceFAZolin (ANCEF) IVPB 2g/100 mL premix  Status:  Discontinued        2 g 200 mL/hr over 30 Minutes Intravenous Every Wed (Hemodialysis) 10/28/22 1004 11/01/22 1452   10/31/22 1200  ceFAZolin (ANCEF) IVPB 2g/100 mL premix  Status:  Discontinued        2 g 200 mL/hr over 30 Minutes Intravenous Every Mon (Hemodialysis) 10/28/22 1004 11/01/22 1452   10/28/22 1200  ceFAZolin (ANCEF) IVPB 3g/100 mL premix  Status:  Discontinued        3 g 200 mL/hr over 30 Minutes Intravenous Every Fri (Hemodialysis) 10/28/22 1004 11/01/22 1452   10/19/22 2200  ceFAZolin (ANCEF) IVPB 1 g/50 mL premix  Status:  Discontinued        1 g 100 mL/hr over 30 Minutes Intravenous Every 24 hours 10/18/22 0834 10/28/22 1004   10/18/22 1000  ceFAZolin (ANCEF) IVPB 2g/100 mL premix        2 g 200 mL/hr over 30 Minutes Intravenous  Once 10/18/22 0834 10/18/22 0946   10/17/22 1400  ceFEPIme (MAXIPIME) 1 g in sodium chloride 0.9 % 100 mL IVPB  Status:  Discontinued        1 g 200 mL/hr over 30 Minutes Intravenous Every 24 hours 10/17/22 1252 10/17/22 1520   10/14/22 1200  vancomycin (VANCOCIN) IVPB 1000 mg/200 mL premix  Status:  Discontinued        1,000 mg 200 mL/hr over 60 Minutes Intravenous Every M-W-F (Hemodialysis) 10/13/22 1356 10/19/22 0809   10/13/22 1400  ceFEPIme (MAXIPIME) 1 g in sodium  chloride 0.9 % 100 mL IVPB  Status:  Discontinued        1 g 200 mL/hr over 30 Minutes Intravenous Every 24 hours 10/13/22 1140 10/17/22 1223   10/13/22 1230  vancomycin (VANCOREADY) IVPB 2000 mg/400 mL        2,000 mg 200 mL/hr over 120 Minutes Intravenous  Once 10/13/22 1140 10/13/22 1437   10/12/22 1645  vancomycin (VANCOREADY) IVPB 1500 mg/300 mL  Status:  Discontinued       See Hyperspace for full Linked Orders Report.   1,500 mg 150 mL/hr over 120 Minutes Intravenous  Once 10/12/22 1110 10/12/22 1753   10/12/22 1600  ceFEPIme (MAXIPIME) 1 g in sodium chloride 0.9 % 100 mL IVPB  Status:  Discontinued        1 g 200 mL/hr over 30 Minutes Intravenous Every 24 hours 10/12/22 1112 10/12/22 1753   10/12/22 1500  vancomycin (VANCOCIN) IVPB 1000 mg/200 mL premix  Status:  Discontinued       See Hyperspace for full Linked  Orders Report.   1,000 mg 200 mL/hr over 60 Minutes Intravenous  Once 10/12/22 1110 10/12/22 1753   10/12/22 1115  ceFEPIme (MAXIPIME) 1 g in sodium chloride 0.9 % 100 mL IVPB  Status:  Discontinued        1 g 200 mL/hr over 30 Minutes Intravenous  Once 10/12/22 1110 10/12/22 1112           Data Reviewed:  I have personally reviewed the following...  CBC: Recent Labs  Lab 11/18/22 0445 11/19/22 0439 11/21/22 0530 11/22/22 0610 11/23/22 0446  WBC 6.3 6.2 7.7 5.0 7.0  NEUTROABS 3.2 3.4 4.6 2.6 3.1  HGB 8.7* 9.0* 8.9* 6.4* 9.3*  HCT 29.0* 30.2* 29.5* 21.7* 31.4*  MCV 81.7 82.7 82.4 84.4 82.4  PLT 161 144* 168 127* 189   Basic Metabolic Panel: Recent Labs  Lab 11/16/22 1508 11/17/22 0650 11/19/22 0439 11/20/22 0502 11/21/22 0530 11/22/22 0610 11/23/22 0446  NA 136   < > 137 136 137 133* 133*  K 4.3   < > 3.8 3.8 4.0 3.7 4.5  CL 97*   < > 99 99 99 97* 97*  CO2 27   < > 29 27 26 24 25   GLUCOSE 161*   < > 145* 148* 141* 101* 133*  BUN 40*   < > 22 35* 45* 25* 37*  CREATININE 7.49*   < > 4.51* 6.25* 7.58* 5.03* 6.63*  CALCIUM 9.9   < > 9.5 10.1  10.2 9.5 10.0  PHOS 5.9*  --   --   --   --   --   --    < > = values in this interval not displayed.   GFR: Estimated Creatinine Clearance: 9.8 mL/min (A) (by C-G formula based on SCr of 6.63 mg/dL (H)). Liver Function Tests: Recent Labs  Lab 11/16/22 1508  ALBUMIN 2.4*   No results for input(s): "LIPASE", "AMYLASE" in the last 168 hours. No results for input(s): "AMMONIA" in the last 168 hours. Coagulation Profile: No results for input(s): "INR", "PROTIME" in the last 168 hours. Cardiac Enzymes: No results for input(s): "CKTOTAL", "CKMB", "CKMBINDEX", "TROPONINI" in the last 168 hours. BNP (last 3 results) No results for input(s): "PROBNP" in the last 8760 hours. HbA1C: No results for input(s): "HGBA1C" in the last 72 hours. CBG: Recent Labs  Lab 11/19/22 2057  GLUCAP 131*   Lipid Profile: No results for input(s): "CHOL", "HDL", "LDLCALC", "TRIG", "CHOLHDL", "LDLDIRECT" in the last 72 hours. Thyroid Function Tests: No results for input(s): "TSH", "T4TOTAL", "FREET4", "T3FREE", "THYROIDAB" in the last 72 hours. Anemia Panel: No results for input(s): "VITAMINB12", "FOLATE", "FERRITIN", "TIBC", "IRON", "RETICCTPCT" in the last 72 hours. Most Recent Urinalysis On File:     Component Value Date/Time   COLORURINE YELLOW (A) 03/20/2022 0125   APPEARANCEUR CLEAR (A) 03/20/2022 0125   APPEARANCEUR Clear 06/14/2014 1510   LABSPEC 1.010 03/20/2022 0125   LABSPEC 1.006 06/14/2014 1510   PHURINE 7.0 03/20/2022 0125   GLUCOSEU 50 (A) 03/20/2022 0125   GLUCOSEU >=500 06/14/2014 1510   HGBUR MODERATE (A) 03/20/2022 0125   BILIRUBINUR NEGATIVE 03/20/2022 0125   BILIRUBINUR Negative 06/14/2014 1510   KETONESUR NEGATIVE 03/20/2022 0125   PROTEINUR 100 (A) 03/20/2022 0125   NITRITE NEGATIVE 03/20/2022 0125   LEUKOCYTESUR NEGATIVE 03/20/2022 0125   LEUKOCYTESUR Negative 06/14/2014 1510   Sepsis Labs: @LABRCNTIP (procalcitonin:4,lacticidven:4) Microbiology: No results found for  this or any previous visit (from the past 240 hour(s)).    Radiology Studies last 3  days: No results found.           LOS: 42 days      Sunnie Nielsen, DO [facility] 11/23/2022, 2:52 PM    Dictation software may have been used to generate the above note. Typos may occur and escape review in typed/dictated notes. Please contact Dr Lyn Hollingshead directly for clarity if needed.  Staff may message me via secure chat in Epic  but this may not receive an immediate response,  please page me for urgent matters!  If 7PM-7AM, please contact night coverage [email or web address]

## 2022-11-23 NOTE — Progress Notes (Signed)
Nutrition Follow-up  DOCUMENTATION CODES:   Not applicable  INTERVENTION:   -Continue renal MVI daily -Continue 30 ml Prosource Plus BID, each supplement provides 100 kcals and 15 grams protein -Continue Nepro Shake po TID, each supplement provides 425 kcal and 19 grams protein  -Liberalize diet to 2 gram sodium for wider variety of meal selections  NUTRITION DIAGNOSIS:   Increased nutrient needs related to chronic illness (ESRD on HD) as evidenced by estimated needs.  Ongoing  GOAL:   Patient will meet greater than or equal to 90% of their needs  Progressing   MONITOR:   PO intake, Supplement acceptance  REASON FOR ASSESSMENT:   Malnutrition Screening Tool    ASSESSMENT:   Pt with PMH of ESRD on HD, HLD, COPD, DM2, HTN, depression, CVA who presents with back pain x 3 weeks PTA.  4/19- triple lumen HD cath placed secondary to clotted AVG  4/24- tunneled HD cath placed via lt internal jugular vein  Reviewed I/O's: +220 ml x 24 hours and -1.1 L since 11/09/22   Pt unavailable at time of visit. Down in HD suite at time of visit.   Pt with improved oral intake. Noted meal completions 50-75%. Pt is also accepting supplements, which is an improvement since last week.   Palliative care following for goals of care; trying to engage family to firm up decisions. Per TOC notes, placement is difficulty, as pt unable to sit in chair for HD currently.   Reviewed wt hx; wt has been stable over the past week.   Medications reviewed and include sensipar, epogen, and keppra.   Labs reviewed: Na: 133, Phos: 5.9, K WDL. CBGS: 131.  Diet Order:   Diet Order             Diet renal/carb modified with fluid restriction Diet-HS Snack? Nothing; Fluid restriction: 1200 mL Fluid; Room service appropriate? Yes; Fluid consistency: Thin  Diet effective now                   EDUCATION NEEDS:   No education needs have been identified at this time  Skin:  Skin Assessment: Skin  Integrity Issues: Skin Integrity Issues:: Other (Comment) Other: IAD to lt buttocks  Last BM:  11/22/22  Height:   Ht Readings from Last 1 Encounters:  10/12/22 6' (1.829 m)    Weight:   Wt Readings from Last 1 Encounters:  11/23/22 89.2 kg    Ideal Body Weight:  72.7 kg  BMI:  Body mass index is 26.67 kg/m.  Estimated Nutritional Needs:   Kcal:  2150-2350  Protein:  105-120 grams  Fluid:  1000 ml + UOP    Levada Schilling, RD, LDN, CDCES Registered Dietitian II Certified Diabetes Care and Education Specialist Please refer to [facility] for RD and/or RD on-call/weekend/after hours pager

## 2022-11-23 NOTE — Hospital Course (Addendum)
Cynthia Dean is a 72 y.o. 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 She presents to the ED with worsening lower extremity pain. Admitted for possible Discitis/osteomyelitis at L4-L5: w/ suspected intradiscal abscess as per MRI. 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   4/18, HD access / fistula clotted off during dialysis.  4/19 vascular placed R femoral temp HD cath placed, resumed HD and pemcath placed 4/24.  5/14- back pain seems to be well-controlled.  Caseworkers have discussed the case, involved ethics committee/Hospital administration. Looking into home dialysis.  Lacks capacity to make informed decision.   6/17: New expressive aphasia since yesterday per nursing, MRI brain  6/18: New stroke confirmed on MRI, neuro consult, stroke workup, palliative care reeval, stop fentanyl

## 2022-11-23 NOTE — Progress Notes (Signed)
[facility]  ROUNDING NOTE   Subjective:   Cynthia Dean is a 72 y.o. 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 She presents to the ED with worsening lower extremity pain. Patient will be admitted for Back pain [M54.9] Bilateral leg pain [M79.604, M79.605]  Patient seen and evaluated at dialysis   HEMODIALYSIS FLOWSHEET:  Blood Flow Rate (mL/min): 400 mL/min Arterial Pressure (mmHg): -190 mmHg Venous Pressure (mmHg): 180 mmHg TMP (mmHg): 0 mmHg Ultrafiltration Rate (mL/min): 0 mL/min Dialysate Flow Rate (mL/min): 300 ml/min Dialysis Fluid Bolus: Normal Saline Bolus Amount (mL): 200 mL  Alert and oriented to self and place Denies pain and discomfort   Objective:  Vital signs in last 24 hours:  Temp:  [98.1 F (36.7 C)-98.8 F (37.1 C)] 98.8 F (37.1 C) (05/01 1249) Pulse Rate:  [78-88] 85 (05/01 1249) Resp:  [14-21] 16 (05/01 1249) BP: (94-150)/(53-97) 142/97 (05/01 1249) SpO2:  [94 %-100 %] 100 % (05/01 1249) Weight:  [88.8 kg-89.2 kg] 88.8 kg (05/01 1157)  Weight change:  Filed Weights   11/21/22 1133 11/23/22 0752 11/23/22 1157  Weight: 90.1 kg 89.2 kg 88.8 kg    Intake/Output: I/O last 3 completed shifts: In: 540 [P.O.:540] Out: -    Intake/Output this shift:  No intake/output data recorded.  Physical Exam: General: NAD  Head: Normocephalic, atraumatic.  Moist oral mucosal membranes  Eyes: Anicteric  Lungs:  Clear to auscultation  Heart: Regular rate and rhythm  Abdomen:  Soft, nontender  Extremities: No peripheral edema.  Neurologic: Alert, moving all extremities   Skin: No lesions  Access: Left AVG (no bruit/thrill)), left chest PermCath    Basic Metabolic Panel: Recent Labs  Lab 11/16/22 1508 11/17/22 0650 11/19/22 0439 11/20/22 0502 11/21/22 0530 11/22/22 0610 11/23/22 0446  NA 136    < > 137 136 137 133* 133*  K 4.3   < > 3.8 3.8 4.0 3.7 4.5  CL 97*   < > 99 99 99 97* 97*  CO2 27   < > 29 27 26 24 25   GLUCOSE 161*   < > 145* 148* 141* 101* 133*  BUN 40*   < > 22 35* 45* 25* 37*  CREATININE 7.49*   < > 4.51* 6.25* 7.58* 5.03* 6.63*  CALCIUM 9.9   < > 9.5 10.1 10.2 9.5 10.0  PHOS 5.9*  --   --   --   --   --   --    < > = values in this interval not displayed.     Liver Function Tests: Recent Labs  Lab 11/16/22 1508  ALBUMIN 2.4*    No results for input(s): "LIPASE", "AMYLASE" in the last 168 hours. No results for input(s): "AMMONIA" in the last 168 hours.  CBC: Recent Labs  Lab 11/18/22 0445 11/19/22 0439 11/21/22 0530 11/22/22 0610 11/23/22 0446  WBC 6.3 6.2 7.7 5.0 7.0  NEUTROABS 3.2 3.4 4.6 2.6 3.1  HGB 8.7* 9.0* 8.9* 6.4* 9.3*  HCT 29.0* 30.2* 29.5* 21.7* 31.4*  MCV 81.7 82.7 82.4 84.4 82.4  PLT 161 144* 168 127* 189     Cardiac Enzymes: No results for input(s): "CKTOTAL", "CKMB", "CKMBINDEX", "TROPONINI" in the last 168 hours.  BNP: Invalid input(s): "POCBNP"  CBG: Recent Labs  Lab 11/19/22 2057  GLUCAP 131*     Microbiology: Results for orders placed or performed during the hospital encounter of 10/12/22  Blood culture (routine x 2)     Status: None   Collection Time: 10/12/22  5:40 PM   Specimen: BLOOD  Result Value Ref Range Status   Specimen Description BLOOD BLOOD RIGHT HAND [facility]  Final   Special Requests   Final    BOTTLES DRAWN AEROBIC AND ANAEROBIC Blood Culture results may not be optimal due to an inadequate volume of blood received in culture bottles   Culture   Final    NO GROWTH 5 DAYS Performed at [facility], [address], [place], [place] [address]    Report Status 10/17/2022 FINAL  Final  Blood culture (routine x 2)     Status: None   Collection Time: 10/12/22  6:56 PM   Specimen: BLOOD  Result Value Ref Range Status   Specimen Description BLOOD BLOOD RIGHT FOREARM  Final   Special Requests    Final    BOTTLES DRAWN AEROBIC ONLY Blood Culture results may not be optimal due to an inadequate volume of blood received in culture bottles   Culture   Final    NO GROWTH 5 DAYS Performed at [facility], [address] [place], [place] [address]    Report Status 10/17/2022 FINAL  Final  Aerobic/Anaerobic Culture w Gram Stain (surgical/deep wound)     Status: None   Collection Time: 10/13/22 11:44 AM   Specimen: Abscess  Result Value Ref Range Status   Specimen Description   Final    ABSCESS Performed at [facility], [address] [place], [place] [address]    Special Requests   Final    MUSCLE ABSCESS Performed at [facility], [address] [place], [place] [address]    Gram Stain   Final    ABUNDANT WBC PRESENT, PREDOMINANTLY PMN NO ORGANISMS SEEN    Culture   Final    FEW STAPHYLOCOCCUS CAPITIS NO ANAEROBES ISOLATED Performed at [facility], [address] [place], [place] [address]    Report Status 10/18/2022 FINAL  Final   Organism ID, Bacteria STAPHYLOCOCCUS CAPITIS  Final      Susceptibility   Staphylococcus capitis - MIC*    CIPROFLOXACIN <=0.5 SENSITIVE Sensitive     ERYTHROMYCIN >=8 RESISTANT Resistant     GENTAMICIN <=0.5 SENSITIVE Sensitive     OXACILLIN <=0.25 SENSITIVE Sensitive     TETRACYCLINE <=1 SENSITIVE Sensitive     VANCOMYCIN 1 SENSITIVE Sensitive     TRIMETH/SULFA <=10 SENSITIVE Sensitive     CLINDAMYCIN INTERMEDIATE Intermediate     RIFAMPIN <=0.5 SENSITIVE Sensitive     Inducible Clindamycin NEGATIVE Sensitive     * FEW STAPHYLOCOCCUS CAPITIS    Coagulation Studies: No results for input(s): "LABPROT", "INR" in the last 72 hours.  Urinalysis: No results for input(s): "COLORURINE", "LABSPEC", "PHURINE", "GLUCOSEU", "HGBUR", "BILIRUBINUR", "KETONESUR", "PROTEINUR", "UROBILINOGEN", "NITRITE", "LEUKOCYTESUR" in the last 72 hours.  Invalid input(s): "APPERANCEUR"    Imaging: No results  found.   Medications:    sodium chloride Stopped (11/16/22 1155)   anticoagulant sodium citrate      ceFAZolin (ANCEF) IV Stopped (11/21/22 1200)    ceFAZolin (ANCEF) IV Stopped (11/23/22 1121)    ceFAZolin (ANCEF) IV Stopped (11/18/22 1204)   methocarbamol (ROBAXIN) IV Stopped (11/08/22 1455)    (feeding supplement) PROSource Plus  30 mL Oral BID BM   Chlorhexidine Gluconate Cloth  6 each Topical Q0600   cinacalcet  30 mg Oral Q supper   epoetin (EPOGEN/PROCRIT) injection  4,000 Units  Intravenous Q M,W,F-HD   feeding supplement (NEPRO CARB STEADY)  237 mL Oral TID BM   fentaNYL  1 patch Transdermal Q72H   gabapentin  100 mg Oral Once per day on Mon Wed Fri   Gerhardt's butt cream   Topical QID   levETIRAcetam  1,000 mg Oral Daily   levETIRAcetam  250 mg Oral Q M,W,F   metoprolol succinate  100 mg Oral Daily   pantoprazole  20 mg Oral BID   psyllium  1 packet Oral Daily   QUEtiapine  25 mg Oral QHS   sertraline  25 mg Oral Daily   sodium chloride flush  10-40 mL Intracatheter Q12H   umeclidinium-vilanterol  1 puff Inhalation Daily   alteplase, anticoagulant sodium citrate, fentaNYL (SUBLIMAZE) injection, hydrALAZINE, lidocaine (PF), lidocaine-prilocaine, LORazepam, methocarbamol (ROBAXIN) IV, ondansetron, senna-docusate, sodium chloride flush  Assessment/ Plan:  Cynthia Dean is a 72 y.o.  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 on MWF schedule.    Receiving dialysis, UF goal 500cc. UF reduced to 0 due to hypotension. Given  NS 200cc bolus with recovery.   Next treatment scheduled for Friday  Patient encouraged to work with staff to sit in chair for short periods. Staff encouraged to document these attempts.   Anemia of chronic kidney disease Normocytic Lab Results  Component Value Date   HGB 9.3 (L) 11/23/2022  Patient receives Mircera at outpatient clinic.  Hgb at goal.  Continue low dose EPO with dialysis.   Secondary Hyperparathyroidism: with outpatient labs: PTH 180 (3/11), phosphorus 5.4, calcium 9.3   Lab Results  Component Value Date   PTH 131 (H) 03/14/2018   CALCIUM 10.0 11/23/2022   CAION 1.09 (L) 08/28/2022   PHOS 5.9 (H) 11/16/2022   -Continue Cinacalcet. -Calcium within desired target, phosphorus elevated, will monitor.   Hypotension during hemodialysis treatment.  Blood pressure 112/61 during dialysis  Diabetes mellitus type II with chronic kidney disease/renal manifestations: insulin dependent, NPH.    Vertebral osteomyelitis, seen on CT lumber spine. Neurosurgery feels patient is not a surgical candidate CT guided aspiration of psoas abscess on 10/13/22. Cultures positive for Staph Capitis.   -Infectious disease recommending cefazolin with dialysis treatments in progress. With end date of 12/02/22.    LOS: 42   5/1/20241:29 PM

## 2022-11-23 NOTE — Progress Notes (Signed)
Family at bedside, even with minimal repositioning in bed pt screams out and cries out in pain, doesn't want to help or cooperate with mobility, doesn't attempt to participate

## 2022-11-23 NOTE — Discharge Planning (Signed)
Continuing to follow this patient for outpatient hemodialysis placement. Per notes, patient has not tolerated sitting in a recliner for dialysis to date. Patient is currently inactive and will need to be reestablished at [facility], clinic is willing to receive patient back when medically stable.

## 2022-11-23 NOTE — Progress Notes (Signed)
Received patient in bed to unit.    Informed consent signed and in chart.    TX duration:3.25     Hospital transported pt back to room Hand-off given to patient's nurse.    Access used: L Chest PC Access issues: None   Total UF removed: 0 Medication(s) given:Epogen, Ancef Post HD VS: Stable Post HD weight: 88.8kg    [facility]

## 2022-11-24 DIAGNOSIS — M79604 Pain in right leg: Secondary | ICD-10-CM | POA: Diagnosis not present

## 2022-11-24 DIAGNOSIS — D649 Anemia, unspecified: Secondary | ICD-10-CM | POA: Diagnosis not present

## 2022-11-24 DIAGNOSIS — M5442 Lumbago with sciatica, left side: Secondary | ICD-10-CM | POA: Diagnosis not present

## 2022-11-24 DIAGNOSIS — E1142 Type 2 diabetes mellitus with diabetic polyneuropathy: Secondary | ICD-10-CM | POA: Diagnosis not present

## 2022-11-24 MED ORDER — PHENOL 1.4 % MT LIQD
1.0000 | OROMUCOSAL | Status: DC | PRN
  Administered 2022-12-18 – 2022-12-19 (×2): 1 via OROMUCOSAL
  Filled 2022-11-24: qty 177

## 2022-11-24 NOTE — Progress Notes (Signed)
Mobility Specialist - Progress Note   11/24/22 1530  Mobility  Activity  (bed level exercises)  Level of Assistance Minimal assist, patient does 75% or more  Assistive Device None  Distance Ambulated (ft) 0 ft  Range of Motion/Exercises Right leg;Left leg;Left arm;Right arm (Active- assistive)  Activity Response Tolerated fair  $Mobility charge 1 Mobility   Pt supine upon entry, utilizing RA. Pt agreeable to bed level exercises this date. All exercises performed supine. Pt required active assistance when completing BLE leg raises-- MS opted to utilize bed sheet to assist with movement. Pt completed 3 BLE leg raises, refused to do anymore. Pt completed 4 bilat quad sets with min assistance-- expressed pain in the right knee (10/10). Pt denied any further activity, left supine with alarm set and needs within reach.   Zetta Bills Mobility Specialist 11/24/22 3:44 PM

## 2022-11-24 NOTE — Progress Notes (Signed)
PROGRESS NOTE    Cynthia Dean   ZOX:[id number] DOB: Sep 26, 1950  DOA: 10/12/2022 Date of Service: 11/24/22 PCP: Loistine Chance, MD     Brief Narrative / Hospital Course:  Cynthia Dean is a 72 y.o. 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 She presents to the ED with worsening lower extremity pain. Admitted for possible Discitis/osteomyelitis at L4-L5: w/ suspected intradiscal abscess as per MRI. 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   4/18, HD access / fistula clotted off during dialysis. 4/19 pt taken for procedure to address fistula malfunction, but was agitated and combative, resfused procedure. 4/19 vascular placed R femoral temp HD cath placed, resumed HD and pemcath placed 4/24  Patient has had waxing and waning mental status with intermittently refusing dialysis. Patient's family have decided to continue hemodialysis after speaking with their PCP. Hospital course prolonged, complicated by patient's inability to tolerate sitting in recliner chair for dialysis.  She only agrees to dialysis in the bed.  This is a barrier to discharge as no outpatient dialysis centers can accommodate.      Consultants:  Neurosurgery Infectious Disease Vascular surgery  Nephrology   Procedures: 09/22/22: Stent placement to the AV graft  10/13/22: CT aspiration of right psoas muscle abscess yielding 2 ml purulent material  11/11/22: R Femoral V catheter placement 11/16/22: Permcath placement to L IJ       ASSESSMENT & PLAN:   Principal Problem:   Back pain Active Problems:   Anemia   Bilateral leg pain   ESRD on dialysis [facility])   Essential hypertension   Dyslipidemia   Seizure disorder (HCC)   Diabetic retinopathy without macular edema associated with type 2 diabetes mellitus  (HCC)   GERD (gastroesophageal reflux disease)   Diabetic neuropathy (HCC)   Anemia of chronic disease   Insulin-requiring or dependent type II diabetes mellitus (HCC)   Discitis of lumbar region   Myositis   Psoas abscess (HCC)   Lumbar discitis, 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   Wound cx growing staph capitis.   ID consulted.  Abx de-escalated to cefazolin.   Not a good surgical candidate as per neuro surg.   Pt having severe low back and bilateral leg pain cont cefazolin, for 6 weeks, End Date: 12/02/22 pain management per orders -- palliative care following transitioned from fentanyl patch + IV PRN >>> scheduled + PRN Norco on 4/17, since staff have not been using PRN fentanyl doses apparently for concern of side effects and/or hypotension.  patient cannot tolerate any position except laying nearly flat in the bed     Confusion and agitation Hospital delirium Pt was agitated, and also not responding to questions or commands during early hospitalization.  Mental status did start to improve, and now pt is calm and interactive, however, at this time she has been having waxing and waning mental status Delirium precautions cont seroquel 50 mg nightly to help with delirium and sleep.     ESRD: on HD MWF.  Pt refused HD on 10/17/22, 10/18/22 and 3/27, 4/1.   Since then, has been agreeable to dialysis without further issues, getting dialysis in hospital bed. When attempts made to do HD in chair, she  did not tolerate on account of severe back pain Recommend consideration for home hemodialysis if no facility capable to do HD in the bed   Generalized weakness / Physical Debility:  PT recs SNF. TOC following. Pt is bedridden and very high risk for developing pressure injuries of skin -- reposition frequently.   Monitor pressures  areas on skin and offload as possible.   Severe back and bilateral leg pain due to severe multi-level lumbar degenerative disc disease. Pt has bulging discs at L2-3, L3-4, L4-5 and L5-S1. Pt will continue to have back and leg pain once antibiotics are complete. Pain mgmt per orders  Pain control complicated by AMS and hypotension with escalation   ACD:  likely secondary ESRD. H&H are stable  Monitor periodic CBC   Hypokalemia Replace as needed Monitor BMP   Peripheral neuropathy --  Continue low dose gabapentin and monitor   Hx of seizures:  continue on home dose of Keppra   HLD:  continue on statin    Hx of COPD: w/o exacerbation.  Continue on bronchodilators    DM2: well controlled HbA1c 5.2.  d/c'ed BG checks    HTN:  Antihypertensives as blood pressure allows   GERD:  continue on PPI    Depression: severity unknown.  Continue on home dose of sertraline     Overweight: BMI 28.2.  Wt loss efforts recommended   Right foot pain  see peripheral neuropathy   DVT prophylaxis: heparin sq Pertinent IV fluids/nutrition: no continuous IV fluids  Central lines / invasive devices: L IJ HD cath, port R chest   Code Status: DNR ACP documentation reviewed: 05/01 - MOST and advanced directive reviewed, [name] is HCPOA  Current Admission Status: inpatient  TOC needs / Dispo plan: SNF Barriers to discharge / significant pending items: unable to sit to toelrate outpatient HD, working w/ PT              Subjective / Brief ROS:  Patient reports no concerns  Denies CP/SOB.      Family Communication: none at this time    Objective Findings:  Vitals:   11/23/22 1610 11/23/22 1959 11/24/22 0531 11/24/22 0802  BP: (!) 103/54 (!) 117/49 (!) 108/51 130/67  Pulse: 88 85 85 85  Resp: 18 19 20 19   Temp:  99.4 F (37.4 C) 98.5 F (36.9 C) 98.3 F (36.8 C)  TempSrc:  Oral Oral   SpO2: 95% (!) 87% 91% 95%  Weight:      Height:         Intake/Output Summary (Last 24 hours) at 11/24/2022 1354 Last data filed at 11/24/2022 1010 Gross per 24 hour  Intake 600 ml  Output --  Net 600 ml    Filed Weights   11/21/22 1133 11/23/22 0752 11/23/22 1157  Weight: 90.1 kg 89.2 kg 88.8 kg    Examination:  Physical Exam Constitutional:      General: She is not in acute distress.    Appearance: She is obese.  Cardiovascular:     Rate and Rhythm: Normal rate and regular rhythm.  Pulmonary:     Effort: Pulmonary effort is normal. No respiratory distress.     Breath sounds: Normal breath sounds.  Neurological:     Mental Status: Mental status is at baseline.          Scheduled Medications:   (feeding supplement) PROSource Plus  30 mL Oral BID BM   Chlorhexidine Gluconate Cloth  6 each Topical Q0600  cinacalcet  30 mg Oral Q supper   epoetin (EPOGEN/PROCRIT) injection  4,000 Units Intravenous Q M,W,F-HD   feeding supplement (NEPRO CARB STEADY)  237 mL Oral TID BM   fentaNYL  1 patch Transdermal Q72H   gabapentin  100 mg Oral Once per day on Mon Wed Fri   Gerhardt's butt cream   Topical QID   levETIRAcetam  1,000 mg Oral Daily   levETIRAcetam  250 mg Oral Q M,W,F   metoprolol succinate  100 mg Oral Daily   pantoprazole  20 mg Oral BID   psyllium  1 packet Oral Daily   QUEtiapine  25 mg Oral QHS   sertraline  25 mg Oral Daily   sodium chloride flush  10-40 mL Intracatheter Q12H   umeclidinium-vilanterol  1 puff Inhalation Daily    Continuous Infusions:  sodium chloride Stopped (11/16/22 1155)   anticoagulant sodium citrate      ceFAZolin (ANCEF) IV Stopped (11/21/22 1200)    ceFAZolin (ANCEF) IV Stopped (11/23/22 1121)    ceFAZolin (ANCEF) IV Stopped (11/18/22 1204)   methocarbamol (ROBAXIN) IV Stopped (11/08/22 1455)    PRN Medications:  alteplase, anticoagulant sodium citrate, fentaNYL (SUBLIMAZE) injection, hydrALAZINE, lidocaine (PF), lidocaine-prilocaine, LORazepam, methocarbamol (ROBAXIN) IV,  ondansetron, senna-docusate, sodium chloride flush  Antimicrobials from admission:  Anti-infectives (From admission, onward)    Start     Dose/Rate Route Frequency Ordered Stop   11/18/22 1200  ceFAZolin (ANCEF) IVPB 3g/100 mL premix        3 g 200 mL/hr over 30 Minutes Intravenous Every Fri (Hemodialysis) 11/13/22 1039     11/17/22 0000  ceFAZolin (ANCEF) IVPB 1 g/50 mL premix       Note to Pharmacy: Send with pt to OR   1 g 100 mL/hr over 30 Minutes Intravenous On call 11/16/22 1154 11/16/22 1500   11/16/22 1200  ceFAZolin (ANCEF) IVPB 2g/100 mL premix        2 g 200 mL/hr over 30 Minutes Intravenous Every Wed (Hemodialysis) 11/13/22 1039     11/14/22 1200  ceFAZolin (ANCEF) IVPB 2g/100 mL premix        2 g 200 mL/hr over 30 Minutes Intravenous Every Mon (Hemodialysis) 11/13/22 1039     11/11/22 2200  ceFAZolin (ANCEF) IVPB 1 g/50 mL premix        1 g 100 mL/hr over 30 Minutes Intravenous Daily at 10 pm 11/10/22 1655 11/13/22 2320   11/09/22 1200  ceFAZolin (ANCEF) IVPB 2g/100 mL premix  Status:  Discontinued        2 g 200 mL/hr over 30 Minutes Intravenous Every Wed (Hemodialysis) 11/02/22 1246 11/03/22 1111   11/07/22 1700  ceFAZolin (ANCEF) IVPB 2g/100 mL premix        2 g 200 mL/hr over 30 Minutes Intravenous  Once 11/07/22 1610 11/07/22 1827   11/07/22 1200  ceFAZolin (ANCEF) IVPB 2g/100 mL premix  Status:  Discontinued        2 g 200 mL/hr over 30 Minutes Intravenous Every Mon (Hemodialysis) 11/02/22 1246 11/10/22 1655   11/04/22 1200  ceFAZolin (ANCEF) IVPB 3g/100 mL premix  Status:  Discontinued        3 g 200 mL/hr over 30 Minutes Intravenous Every Fri (Hemodialysis) 11/02/22 1246 11/10/22 1655   11/02/22 2200  ceFAZolin (ANCEF) IVPB 1 g/50 mL premix  Status:  Discontinued        1 g 100 mL/hr over 30 Minutes Intravenous Daily at bedtime 11/01/22 1452 11/02/22 1246   11/02/22  1330  ceFAZolin (ANCEF) IVPB 2g/100 mL premix  Status:  Discontinued        2 g 200 mL/hr  over 30 Minutes Intravenous Every Wed (Hemodialysis) 11/02/22 1320 11/10/22 1655   11/02/22 1200  ceFAZolin (ANCEF) IVPB 2g/100 mL premix  Status:  Discontinued        2 g 200 mL/hr over 30 Minutes Intravenous Every Wed (Hemodialysis) 10/28/22 1004 11/01/22 1452   10/31/22 1200  ceFAZolin (ANCEF) IVPB 2g/100 mL premix  Status:  Discontinued        2 g 200 mL/hr over 30 Minutes Intravenous Every Mon (Hemodialysis) 10/28/22 1004 11/01/22 1452   10/28/22 1200  ceFAZolin (ANCEF) IVPB 3g/100 mL premix  Status:  Discontinued        3 g 200 mL/hr over 30 Minutes Intravenous Every Fri (Hemodialysis) 10/28/22 1004 11/01/22 1452   10/19/22 2200  ceFAZolin (ANCEF) IVPB 1 g/50 mL premix  Status:  Discontinued        1 g 100 mL/hr over 30 Minutes Intravenous Every 24 hours 10/18/22 0834 10/28/22 1004   10/18/22 1000  ceFAZolin (ANCEF) IVPB 2g/100 mL premix        2 g 200 mL/hr over 30 Minutes Intravenous  Once 10/18/22 0834 10/18/22 0946   10/17/22 1400  ceFEPIme (MAXIPIME) 1 g in sodium chloride 0.9 % 100 mL IVPB  Status:  Discontinued        1 g 200 mL/hr over 30 Minutes Intravenous Every 24 hours 10/17/22 1252 10/17/22 1520   10/14/22 1200  vancomycin (VANCOCIN) IVPB 1000 mg/200 mL premix  Status:  Discontinued        1,000 mg 200 mL/hr over 60 Minutes Intravenous Every M-W-F (Hemodialysis) 10/13/22 1356 10/19/22 0809   10/13/22 1400  ceFEPIme (MAXIPIME) 1 g in sodium chloride 0.9 % 100 mL IVPB  Status:  Discontinued        1 g 200 mL/hr over 30 Minutes Intravenous Every 24 hours 10/13/22 1140 10/17/22 1223   10/13/22 1230  vancomycin (VANCOREADY) IVPB 2000 mg/400 mL        2,000 mg 200 mL/hr over 120 Minutes Intravenous  Once 10/13/22 1140 10/13/22 1437   10/12/22 1645  vancomycin (VANCOREADY) IVPB 1500 mg/300 mL  Status:  Discontinued       See Hyperspace for full Linked Orders Report.   1,500 mg 150 mL/hr over 120 Minutes Intravenous  Once 10/12/22 1110 10/12/22 1753   10/12/22 1600   ceFEPIme (MAXIPIME) 1 g in sodium chloride 0.9 % 100 mL IVPB  Status:  Discontinued        1 g 200 mL/hr over 30 Minutes Intravenous Every 24 hours 10/12/22 1112 10/12/22 1753   10/12/22 1500  vancomycin (VANCOCIN) IVPB 1000 mg/200 mL premix  Status:  Discontinued       See Hyperspace for full Linked Orders Report.   1,000 mg 200 mL/hr over 60 Minutes Intravenous  Once 10/12/22 1110 10/12/22 1753   10/12/22 1115  ceFEPIme (MAXIPIME) 1 g in sodium chloride 0.9 % 100 mL IVPB  Status:  Discontinued        1 g 200 mL/hr over 30 Minutes Intravenous  Once 10/12/22 1110 10/12/22 1112           Data Reviewed:  I have personally reviewed the following...  CBC: Recent Labs  Lab 11/18/22 0445 11/19/22 0439 11/21/22 0530 11/22/22 0610 11/23/22 0446  WBC 6.3 6.2 7.7 5.0 7.0  NEUTROABS 3.2 3.4 4.6 2.6 3.1  HGB 8.7* 9.0*  8.9* 6.4* 9.3*  HCT 29.0* 30.2* 29.5* 21.7* 31.4*  MCV 81.7 82.7 82.4 84.4 82.4  PLT 161 144* 168 127* 189    Basic Metabolic Panel: Recent Labs  Lab 11/19/22 0439 11/20/22 0502 11/21/22 0530 11/22/22 0610 11/23/22 0446  NA 137 136 137 133* 133*  K 3.8 3.8 4.0 3.7 4.5  CL 99 99 99 97* 97*  CO2 29 27 26 24 25   GLUCOSE 145* 148* 141* 101* 133*  BUN 22 35* 45* 25* 37*  CREATININE 4.51* 6.25* 7.58* 5.03* 6.63*  CALCIUM 9.5 10.1 10.2 9.5 10.0    GFR: Estimated Creatinine Clearance: 9.8 mL/min (A) (by C-G formula based on SCr of 6.63 mg/dL (H)). Liver Function Tests: No results for input(s): "AST", "ALT", "ALKPHOS", "BILITOT", "PROT", "ALBUMIN" in the last 168 hours.  No results for input(s): "LIPASE", "AMYLASE" in the last 168 hours. No results for input(s): "AMMONIA" in the last 168 hours. Coagulation Profile: No results for input(s): "INR", "PROTIME" in the last 168 hours. Cardiac Enzymes: No results for input(s): "CKTOTAL", "CKMB", "CKMBINDEX", "TROPONINI" in the last 168 hours. BNP (last 3 results) No results for input(s): "PROBNP" in the last 8760  hours. HbA1C: No results for input(s): "HGBA1C" in the last 72 hours. CBG: Recent Labs  Lab 11/19/22 2057  GLUCAP 131*    Lipid Profile: No results for input(s): "CHOL", "HDL", "LDLCALC", "TRIG", "CHOLHDL", "LDLDIRECT" in the last 72 hours. Thyroid Function Tests: No results for input(s): "TSH", "T4TOTAL", "FREET4", "T3FREE", "THYROIDAB" in the last 72 hours. Anemia Panel: No results for input(s): "VITAMINB12", "FOLATE", "FERRITIN", "TIBC", "IRON", "RETICCTPCT" in the last 72 hours. Most Recent Urinalysis On File:     Component Value Date/Time   COLORURINE YELLOW (A) 03/20/2022 0125   APPEARANCEUR CLEAR (A) 03/20/2022 0125   APPEARANCEUR Clear 06/14/2014 1510   LABSPEC 1.010 03/20/2022 0125   LABSPEC 1.006 06/14/2014 1510   PHURINE 7.0 03/20/2022 0125   GLUCOSEU 50 (A) 03/20/2022 0125   GLUCOSEU >=500 06/14/2014 1510   HGBUR MODERATE (A) 03/20/2022 0125   BILIRUBINUR NEGATIVE 03/20/2022 0125   BILIRUBINUR Negative 06/14/2014 1510   KETONESUR NEGATIVE 03/20/2022 0125   PROTEINUR 100 (A) 03/20/2022 0125   NITRITE NEGATIVE 03/20/2022 0125   LEUKOCYTESUR NEGATIVE 03/20/2022 0125   LEUKOCYTESUR Negative 06/14/2014 1510   Sepsis Labs: @LABRCNTIP (procalcitonin:4,lacticidven:4) Microbiology: No results found for this or any previous visit (from the past 240 hour(s)).    Radiology Studies last 3 days: No results found.           LOS: 43 days      Sunnie Nielsen, DO [facility] 11/24/2022, 1:54 PM    Dictation software may have been used to generate the above note. Typos may occur and escape review in typed/dictated notes. Please contact Dr Lyn Hollingshead directly for clarity if needed.  Staff may message me via secure chat in Epic  but this may not receive an immediate response,  please page me for urgent matters!  If 7PM-7AM, please contact night coverage [email or web address]

## 2022-11-24 NOTE — Progress Notes (Signed)
[facility]  ROUNDING NOTE   Subjective:   Cynthia Dean is a 72 y.o. 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 She presents to the ED with worsening lower extremity pain. Patient will be admitted for Back pain [M54.9] Bilateral leg pain [M79.604, M79.605]  Patient resting quietly No family at bedside Minimal response from patient, opens eyes briefly, then closes   Objective:  Vital signs in last 24 hours:  Temp:  [98.3 F (36.8 C)-99.4 F (37.4 C)] 98.3 F (36.8 C) (05/02 0802) Pulse Rate:  [85-88] 85 (05/02 0802) Resp:  [16-20] 19 (05/02 0802) BP: (103-142)/(49-97) 130/67 (05/02 0802) SpO2:  [87 %-100 %] 95 % (05/02 0802) Weight:  [88.8 kg] 88.8 kg (05/01 1157)  Weight change:  Filed Weights   11/21/22 1133 11/23/22 0752 11/23/22 1157  Weight: 90.1 kg 89.2 kg 88.8 kg    Intake/Output: I/O last 3 completed shifts: In: 720 [P.O.:720] Out: 0    Intake/Output this shift:  No intake/output data recorded.  Physical Exam: General: NAD  Head: Normocephalic, atraumatic.  Moist oral mucosal membranes  Eyes: Anicteric  Lungs:  Clear to auscultation  Heart: Regular rate and rhythm  Abdomen:  Soft, nontender  Extremities: No peripheral edema.  Neurologic: moving all extremities   Skin: No lesions  Access: Left AVG (no bruit/thrill)), left chest PermCath    Basic Metabolic Panel: Recent Labs  Lab 11/19/22 0439 11/20/22 0502 11/21/22 0530 11/22/22 0610 11/23/22 0446  NA 137 136 137 133* 133*  K 3.8 3.8 4.0 3.7 4.5  CL 99 99 99 97* 97*  CO2 29 27 26 24 25   GLUCOSE 145* 148* 141* 101* 133*  BUN 22 35* 45* 25* 37*  CREATININE 4.51* 6.25* 7.58* 5.03* 6.63*  CALCIUM 9.5 10.1 10.2 9.5 10.0     Liver Function Tests: No results for input(s): "AST", "ALT", "ALKPHOS", "BILITOT", "PROT", "ALBUMIN" in the last 168  hours.  No results for input(s): "LIPASE", "AMYLASE" in the last 168 hours. No results for input(s): "AMMONIA" in the last 168 hours.  CBC: Recent Labs  Lab 11/18/22 0445 11/19/22 0439 11/21/22 0530 11/22/22 0610 11/23/22 0446  WBC 6.3 6.2 7.7 5.0 7.0  NEUTROABS 3.2 3.4 4.6 2.6 3.1  HGB 8.7* 9.0* 8.9* 6.4* 9.3*  HCT 29.0* 30.2* 29.5* 21.7* 31.4*  MCV 81.7 82.7 82.4 84.4 82.4  PLT 161 144* 168 127* 189     Cardiac Enzymes: No results for input(s): "CKTOTAL", "CKMB", "CKMBINDEX", "TROPONINI" in the last 168 hours.  BNP: Invalid input(s): "POCBNP"  CBG: Recent Labs  Lab 11/19/22 2057  GLUCAP 131*     Microbiology: Results for orders placed or performed during the hospital encounter of 10/12/22  Blood culture (routine x 2)     Status: None   Collection Time: 10/12/22  5:40 PM   Specimen: BLOOD  Result Value Ref Range Status   Specimen Description BLOOD BLOOD RIGHT HAND [facility]  Final   Special Requests   Final    BOTTLES DRAWN AEROBIC AND ANAEROBIC Blood Culture results may not be optimal due to an inadequate volume of blood received in culture bottles   Culture   Final    NO GROWTH 5 DAYS Performed at [facility], [address], [place], [place] [address]    Report Status 10/17/2022 FINAL  Final  Blood culture (routine x 2)     Status: None  Collection Time: 10/12/22  6:56 PM   Specimen: BLOOD  Result Value Ref Range Status   Specimen Description BLOOD BLOOD RIGHT FOREARM  Final   Special Requests   Final    BOTTLES DRAWN AEROBIC ONLY Blood Culture results may not be optimal due to an inadequate volume of blood received in culture bottles   Culture   Final    NO GROWTH 5 DAYS Performed at [facility], [address], [place], [place] [address]    Report Status 10/17/2022 FINAL  Final  Aerobic/Anaerobic Culture w Gram Stain (surgical/deep wound)     Status: None   Collection Time: 10/13/22 11:44 AM   Specimen: Abscess  Result Value Ref  Range Status   Specimen Description   Final    ABSCESS Performed at [facility], [address] [place], [place] [address]    Special Requests   Final    MUSCLE ABSCESS Performed at [facility], [address] [place], [place] [address]    Gram Stain   Final    ABUNDANT WBC PRESENT, PREDOMINANTLY PMN NO ORGANISMS SEEN    Culture   Final    FEW STAPHYLOCOCCUS CAPITIS NO ANAEROBES ISOLATED Performed at [facility], [address], [place], [place] [address]    Report Status 10/18/2022 FINAL  Final   Organism ID, Bacteria STAPHYLOCOCCUS CAPITIS  Final      Susceptibility   Staphylococcus capitis - MIC*    CIPROFLOXACIN <=0.5 SENSITIVE Sensitive     ERYTHROMYCIN >=8 RESISTANT Resistant     GENTAMICIN <=0.5 SENSITIVE Sensitive     OXACILLIN <=0.25 SENSITIVE Sensitive     TETRACYCLINE <=1 SENSITIVE Sensitive     VANCOMYCIN 1 SENSITIVE Sensitive     TRIMETH/SULFA <=10 SENSITIVE Sensitive     CLINDAMYCIN INTERMEDIATE Intermediate     RIFAMPIN <=0.5 SENSITIVE Sensitive     Inducible Clindamycin NEGATIVE Sensitive     * FEW STAPHYLOCOCCUS CAPITIS    Coagulation Studies: No results for input(s): "LABPROT", "INR" in the last 72 hours.  Urinalysis: No results for input(s): "COLORURINE", "LABSPEC", "PHURINE", "GLUCOSEU", "HGBUR", "BILIRUBINUR", "KETONESUR", "PROTEINUR", "UROBILINOGEN", "NITRITE", "LEUKOCYTESUR" in the last 72 hours.  Invalid input(s): "APPERANCEUR"    Imaging: No results found.   Medications:    sodium chloride Stopped (11/16/22 1155)   anticoagulant sodium citrate      ceFAZolin (ANCEF) IV Stopped (11/21/22 1200)    ceFAZolin (ANCEF) IV Stopped (11/23/22 1121)    ceFAZolin (ANCEF) IV Stopped (11/18/22 1204)   methocarbamol (ROBAXIN) IV Stopped (11/08/22 1455)    (feeding supplement) PROSource Plus  30 mL Oral BID BM   Chlorhexidine Gluconate Cloth  6 each Topical Q0600   cinacalcet  30 mg Oral Q supper   epoetin  (EPOGEN/PROCRIT) injection  4,000 Units Intravenous Q M,W,F-HD   feeding supplement (NEPRO CARB STEADY)  237 mL Oral TID BM   fentaNYL  1 patch Transdermal Q72H   gabapentin  100 mg Oral Once per day on Mon Wed Fri   Gerhardt's butt cream   Topical QID   levETIRAcetam  1,000 mg Oral Daily   levETIRAcetam  250 mg Oral Q M,W,F   metoprolol succinate  100 mg Oral Daily   pantoprazole  20 mg Oral BID   psyllium  1 packet Oral Daily   QUEtiapine  25 mg Oral QHS   sertraline  25 mg Oral Daily   sodium chloride flush  10-40 mL Intracatheter Q12H   umeclidinium-vilanterol  1 puff  Inhalation Daily   alteplase, anticoagulant sodium citrate, fentaNYL (SUBLIMAZE) injection, hydrALAZINE, lidocaine (PF), lidocaine-prilocaine, LORazepam, methocarbamol (ROBAXIN) IV, ondansetron, senna-docusate, sodium chloride flush  Assessment/ Plan:  Ms. Wynona Duhamel is a 72 y.o.  female with end stage renal disease on hemodialysis, coronary artery disease, COPD with intermittent home O2, hypertension, hyperlipidemia, stroke, diabetes mellitus type II, and breast cancer who was admitted to [facility] on 10/12/2022 for Back pain [M54.9] Bilateral leg pain [M79.604, M79.605]  Patient was found to have vertebral osteomyelitis and psoas abscess. Cultures positive for staph capitis  [facility] MWF Left AVG 102.5kg  End-stage renal disease on hemodialysis. on MWF schedule.     Next treatment scheduled for Friday  Appreciate staff documentation, patient very uncomfortable when repositioned in bed. Reportedly screaming and grabbing at staff. Patient may not be able to tolerate dialysis sitting in recliner.   Anemia of chronic kidney disease Normocytic Lab Results  Component Value Date   HGB 9.3 (L) 11/23/2022  Patient receives Mircera at outpatient clinic.  Hgb at goal.  Continue low dose EPO with dialysis.   Secondary Hyperparathyroidism: with outpatient labs: PTH 180 (3/11), phosphorus 5.4, calcium  9.3   Lab Results  Component Value Date   PTH 131 (H) 03/14/2018   CALCIUM 10.0 11/23/2022   CAION 1.09 (L) 08/28/2022   PHOS 5.9 (H) 11/16/2022   -Continue Cinacalcet. -Will continue to monitor bone minerals during this admission.  Hypotension during hemodialysis treatment.  Blood pressure stable for this patient, 130/67  Diabetes mellitus type II with chronic kidney disease/renal manifestations: insulin dependent, NPH.    Vertebral osteomyelitis, seen on CT lumber spine. Neurosurgery feels patient is not a surgical candidate CT guided aspiration of psoas abscess on 10/13/22. Cultures positive for Staph Capitis.   -Infectious disease recommending cefazolin with dialysis treatments in progress. With end date of 12/02/22.    LOS: 43   5/2/202411:42 AM

## 2022-11-25 DIAGNOSIS — M5442 Lumbago with sciatica, left side: Secondary | ICD-10-CM | POA: Diagnosis not present

## 2022-11-25 DIAGNOSIS — D649 Anemia, unspecified: Secondary | ICD-10-CM | POA: Diagnosis not present

## 2022-11-25 DIAGNOSIS — M79604 Pain in right leg: Secondary | ICD-10-CM | POA: Diagnosis not present

## 2022-11-25 DIAGNOSIS — E1142 Type 2 diabetes mellitus with diabetic polyneuropathy: Secondary | ICD-10-CM | POA: Diagnosis not present

## 2022-11-25 LAB — RENAL FUNCTION PANEL
Albumin: 2.5 g/dL — ABNORMAL LOW (ref 3.5–5.0)
Anion gap: 11 (ref 5–15)
BUN: 34 mg/dL — ABNORMAL HIGH (ref 8–23)
CO2: 27 mmol/L (ref 22–32)
Calcium: 10.1 mg/dL (ref 8.9–10.3)
Chloride: 98 mmol/L (ref 98–111)
Creatinine, Ser: 6.75 mg/dL — ABNORMAL HIGH (ref 0.44–1.00)
GFR, Estimated: 6 mL/min — ABNORMAL LOW (ref >60)
Glucose, Bld: 145 mg/dL — ABNORMAL HIGH (ref 70–99)
Phosphorus: 5.7 mg/dL — ABNORMAL HIGH (ref 2.5–4.6)
Potassium: 3.8 mmol/L (ref 3.5–5.1)
Sodium: 136 mmol/L (ref 135–145)

## 2022-11-25 LAB — CBC
HCT: 32.2 % — ABNORMAL LOW (ref 36.0–46.0)
Hemoglobin: 9.6 g/dL — ABNORMAL LOW (ref 12.0–15.0)
MCH: 25.2 pg — ABNORMAL LOW (ref 26.0–34.0)
MCHC: 29.8 g/dL — ABNORMAL LOW (ref 30.0–36.0)
MCV: 84.5 fL (ref 80.0–100.0)
Platelets: 230 10*3/uL (ref 150–400)
RBC: 3.81 MIL/uL — ABNORMAL LOW (ref 3.87–5.11)
RDW: 19.7 % — ABNORMAL HIGH (ref 11.5–15.5)
WBC: 7.4 10*3/uL (ref 4.0–10.5)
nRBC: 0 % (ref 0.0–0.2)

## 2022-11-25 MED ORDER — FENTANYL CITRATE PF 50 MCG/ML IJ SOSY
12.5000 ug | PREFILLED_SYRINGE | INTRAMUSCULAR | Status: DC | PRN
  Administered 2022-11-28 – 2023-01-05 (×25): 12.5 ug via INTRAVENOUS
  Filled 2022-11-25 (×21): qty 1

## 2022-11-25 MED ORDER — EPOETIN ALFA 4000 UNIT/ML IJ SOLN
INTRAMUSCULAR | Status: AC
  Filled 2022-11-25: qty 1

## 2022-11-25 MED ORDER — HEPARIN SODIUM (PORCINE) 1000 UNIT/ML IJ SOLN
INTRAMUSCULAR | Status: AC
  Filled 2022-11-25: qty 10

## 2022-11-25 MED ORDER — FENTANYL CITRATE PF 50 MCG/ML IJ SOSY
PREFILLED_SYRINGE | INTRAMUSCULAR | Status: AC
  Filled 2022-11-25: qty 1

## 2022-11-25 MED ORDER — FENTANYL 25 MCG/HR TD PT72
1.0000 | MEDICATED_PATCH | TRANSDERMAL | Status: DC
  Administered 2022-11-25 – 2023-01-09 (×16): 1 via TRANSDERMAL
  Filled 2022-11-25 (×17): qty 1

## 2022-11-25 NOTE — Progress Notes (Signed)
Hemodialysis note  Received patient in bed to unit. Alert and oriented to self.  Informed consent signed and in chart.  Treatment initiated: 1223 Treatment completed: 1545  Patient tolerated well. Transported back to room, alert without acute distress.  Report given to patient's RN.   Access used: Left chest HD PermCatheter Access issues: none  Total UF removed: 500 ml Medication(s) given:  Fentanyl 12.5 mcg IV, Cefazolin 3g IV, Epogen 4000 units  Post HD weight: 87 kg   Cynthia Dean Cynthia [facility]

## 2022-11-25 NOTE — Progress Notes (Signed)
[facility]  ROUNDING NOTE   Subjective:   Cynthia Dean is a 72 y.o. 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 She presents to the ED with worsening lower extremity pain. Patient will be admitted for Back pain [M54.9] Bilateral leg pain [M79.604, M79.605]  Patient seen laying in bed No family present Alert today Does not appear to be in pain.  Dialysis scheduled later today   Objective:  Vital signs in last 24 hours:  Temp:  [97.7 F (36.5 C)-98.3 F (36.8 C)] 98.3 F (36.8 C) (05/03 1215) Pulse Rate:  [70-81] 81 (05/03 1400) Resp:  [13-17] 17 (05/03 1400) BP: (103-150)/(43-79) 120/71 (05/03 1400) SpO2:  [95 %-100 %] 96 % (05/03 1400) Weight:  [87.4 kg] 87.4 kg (05/03 1215)  Weight change:  Filed Weights   11/23/22 0752 11/23/22 1157 11/25/22 1215  Weight: 89.2 kg 88.8 kg 87.4 kg    Intake/Output: I/O last 3 completed shifts: In: 360 [P.O.:360] Out: -    Intake/Output this shift:  Total I/O In: 100 [P.O.:100] Out: -   Physical Exam: General: NAD  Head: Normocephalic, atraumatic.  Moist oral mucosal membranes  Eyes: Anicteric  Lungs:  Clear to auscultation  Heart: Regular rate and rhythm  Abdomen:  Soft, nontender  Extremities: No peripheral edema.  Neurologic: moving all extremities   Skin: No lesions  Access: Left AVG (no bruit/thrill)), left chest PermCath    Basic Metabolic Panel: Recent Labs  Lab 11/20/22 0502 11/21/22 0530 11/22/22 0610 11/23/22 0446 11/25/22 1224  NA 136 137 133* 133* 136  K 3.8 4.0 3.7 4.5 3.8  CL 99 99 97* 97* 98  CO2 27 26 24 25 27   GLUCOSE 148* 141* 101* 133* 145*  BUN 35* 45* 25* 37* 34*  CREATININE 6.25* 7.58* 5.03* 6.63* 6.75*  CALCIUM 10.1 10.2 9.5 10.0 10.1  PHOS  --   --   --   --  5.7*     Liver Function Tests: Recent Labs  Lab 11/25/22 1224   ALBUMIN 2.5*    No results for input(s): "LIPASE", "AMYLASE" in the last 168 hours. No results for input(s): "AMMONIA" in the last 168 hours.  CBC: Recent Labs  Lab 11/19/22 0439 11/21/22 0530 11/22/22 0610 11/23/22 0446 11/25/22 1224  WBC 6.2 7.7 5.0 7.0 7.4  NEUTROABS 3.4 4.6 2.6 3.1  --   HGB 9.0* 8.9* 6.4* 9.3* 9.6*  HCT 30.2* 29.5* 21.7* 31.4* 32.2*  MCV 82.7 82.4 84.4 82.4 84.5  PLT 144* 168 127* 189 230     Cardiac Enzymes: No results for input(s): "CKTOTAL", "CKMB", "CKMBINDEX", "TROPONINI" in the last 168 hours.  BNP: Invalid input(s): "POCBNP"  CBG: Recent Labs  Lab 11/19/22 2057  GLUCAP 131*     Microbiology: Results for orders placed or performed during the hospital encounter of 10/12/22  Blood culture (routine x 2)     Status: None   Collection Time: 10/12/22  5:40 PM   Specimen: BLOOD  Result Value Ref Range Status   Specimen Description BLOOD BLOOD RIGHT HAND [facility]  Final   Special Requests   Final    BOTTLES DRAWN AEROBIC AND ANAEROBIC Blood Culture results may not be optimal due to an inadequate volume of blood received in culture bottles   Culture   Final    NO GROWTH 5 DAYS Performed at [facility], [address], [place], [place] [address]  Report Status 10/17/2022 FINAL  Final  Blood culture (routine x 2)     Status: None   Collection Time: 10/12/22  6:56 PM   Specimen: BLOOD  Result Value Ref Range Status   Specimen Description BLOOD BLOOD RIGHT FOREARM  Final   Special Requests   Final    BOTTLES DRAWN AEROBIC ONLY Blood Culture results may not be optimal due to an inadequate volume of blood received in culture bottles   Culture   Final    NO GROWTH 5 DAYS Performed at [facility], [address] [place], [place] [address]    Report Status 10/17/2022 FINAL  Final  Aerobic/Anaerobic Culture w Gram Stain (surgical/deep wound)     Status: None   Collection Time: 10/13/22 11:44 AM   Specimen: Abscess   Result Value Ref Range Status   Specimen Description   Final    ABSCESS Performed at [facility], [address] [place], [place] [address]    Special Requests   Final    MUSCLE ABSCESS Performed at [facility], [address] [place], [place] [address]    Gram Stain   Final    ABUNDANT WBC PRESENT, PREDOMINANTLY PMN NO ORGANISMS SEEN    Culture   Final    FEW STAPHYLOCOCCUS CAPITIS NO ANAEROBES ISOLATED Performed at [facility], [address], [place], [place] [address]    Report Status 10/18/2022 FINAL  Final   Organism ID, Bacteria STAPHYLOCOCCUS CAPITIS  Final      Susceptibility   Staphylococcus capitis - MIC*    CIPROFLOXACIN <=0.5 SENSITIVE Sensitive     ERYTHROMYCIN >=8 RESISTANT Resistant     GENTAMICIN <=0.5 SENSITIVE Sensitive     OXACILLIN <=0.25 SENSITIVE Sensitive     TETRACYCLINE <=1 SENSITIVE Sensitive     VANCOMYCIN 1 SENSITIVE Sensitive     TRIMETH/SULFA <=10 SENSITIVE Sensitive     CLINDAMYCIN INTERMEDIATE Intermediate     RIFAMPIN <=0.5 SENSITIVE Sensitive     Inducible Clindamycin NEGATIVE Sensitive     * FEW STAPHYLOCOCCUS CAPITIS    Coagulation Studies: No results for input(s): "LABPROT", "INR" in the last 72 hours.  Urinalysis: No results for input(s): "COLORURINE", "LABSPEC", "PHURINE", "GLUCOSEU", "HGBUR", "BILIRUBINUR", "KETONESUR", "PROTEINUR", "UROBILINOGEN", "NITRITE", "LEUKOCYTESUR" in the last 72 hours.  Invalid input(s): "APPERANCEUR"    Imaging: No results found.   Medications:    sodium chloride Stopped (11/16/22 1155)   anticoagulant sodium citrate      ceFAZolin (ANCEF) IV Stopped (11/21/22 1200)    ceFAZolin (ANCEF) IV Stopped (11/23/22 1121)    ceFAZolin (ANCEF) IV Stopped (11/18/22 1204)   methocarbamol (ROBAXIN) IV Stopped (11/08/22 1455)    (feeding supplement) PROSource Plus  30 mL Oral BID BM   Chlorhexidine Gluconate Cloth  6 each Topical Q0600   cinacalcet  30 mg Oral Q supper    epoetin (EPOGEN/PROCRIT) injection  4,000 Units Intravenous Q M,W,F-HD   feeding supplement (NEPRO CARB STEADY)  237 mL Oral TID BM   fentaNYL  1 patch Transdermal Q72H   gabapentin  100 mg Oral Once per day on Mon Wed Fri   Gerhardt's butt cream   Topical QID   levETIRAcetam  1,000 mg Oral Daily   levETIRAcetam  250 mg Oral Q M,W,F   metoprolol succinate  100 mg Oral Daily   pantoprazole  20 mg Oral BID   psyllium  1 packet Oral Daily   QUEtiapine  25 mg Oral QHS   sertraline  25 mg Oral Daily   sodium chloride flush  10-40 mL Intracatheter Q12H   umeclidinium-vilanterol  1 puff Inhalation Daily   alteplase, anticoagulant sodium citrate, fentaNYL (SUBLIMAZE) injection, hydrALAZINE, lidocaine (PF), lidocaine-prilocaine, LORazepam, methocarbamol (ROBAXIN) IV, ondansetron, phenol, senna-docusate, sodium chloride flush  Assessment/ Plan:  [name] is a 71 y.o.  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 Cultures positive for staph capitis  [facility] MWF Left AVG 102.5kg  End-stage renal disease on hemodialysis. on MWF schedule.     Will receive treatment later today, UF 0.5L as tolerated. Next treatment scheduled for Monday.   Anemia of chronic kidney disease Normocytic Lab Results  Component Value Date   HGB 9.6 (L) 11/25/2022  Patient receives Mircera at outpatient clinic.  Hgb at goal.  Continue low dose EPO with dialysis.   Secondary Hyperparathyroidism: with outpatient labs: PTH 180 (3/11), phosphorus 5.4, calcium 9.3   Lab Results  Component Value Date   PTH 131 (H) 03/14/2018   CALCIUM 10.1 11/25/2022   CAION 1.09 (L)  08/28/2022   PHOS 5.7 (H) 11/25/2022   -Continue Cinacalcet. -Bone minerals acceptable.   Hypotension during hemodialysis treatment.  Blood pressure 130/72  Diabetes mellitus type II with chronic kidney disease/renal manifestations: insulin dependent, NPH.    Vertebral osteomyelitis, seen on CT lumber spine. Neurosurgery feels patient is not a surgical candidate CT guided aspiration of psoas abscess on 10/13/22. Cultures positive for Staph Capitis.   -Infectious disease recommending cefazolin with dialysis treatments in progress. With end date of 12/02/22.    LOS: 44   5/3/20242:01 PM

## 2022-11-25 NOTE — Progress Notes (Signed)
Mobility Specialist - Progress Note   11/25/22 1045  Mobility  Activity Moved into chair position in bed (bed exercises)  Level of Assistance Minimal assist, patient does 75% or more  Assistive Device None  Range of Motion/Exercises Right arm;Left arm;Right leg;Left leg;Active (& active-assistive)  Activity Response Tolerated well  $Mobility charge 1 Mobility   Pt supine upon entry, utilizing RA. Pt agreeable to some activity this date. Pt expressed having continued back and RLE pain. MS placed Pt in chair position before starting activity -- HOB raised between 60-90 degrees, knees lowered. Pt tolerated chair position for ~2 mins before requesting the HOB be lowered. While in chair position, Pt completed Bilat toe wiggles, ankle pumps/circles with MinA for RLE. Once MS lowered the HOB, Pt completed 4 Bilat leg raises and supine heel slides -- active assistive. Pt left supine with alarm set and needs within reach.   Zetta Bills Mobility Specialist 11/25/22 10:56 AM

## 2022-11-25 NOTE — Progress Notes (Signed)
PROGRESS NOTE    Cynthia Dean   ZOX:[id number] DOB: 05-06-1951  DOA: 10/12/2022 Date of Service: 11/25/22 PCP: Loistine Chance, MD     Brief Narrative / Hospital Course:  Cynthia Dean is a 72 y.o. 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 She presents to the ED with worsening lower extremity pain. Admitted for possible Discitis/osteomyelitis at L4-L5: w/ suspected intradiscal abscess as per MRI. 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   4/18, HD access / fistula clotted off during dialysis. 4/19 pt taken for procedure to address fistula malfunction, but was agitated and combative, resfused procedure. 4/19 vascular placed R femoral temp HD cath placed, resumed HD and pemcath placed 4/24  Patient has had waxing and waning mental status with intermittently refusing dialysis. Patient's family have decided to continue hemodialysis after speaking with their PCP. Hospital course prolonged, complicated by patient's inability to tolerate sitting in recliner chair for dialysis.  She only agrees to dialysis in the bed.  This is a barrier to discharge as no outpatient dialysis centers can accommodate.      Consultants:  Neurosurgery Infectious Disease Vascular surgery  Nephrology   Procedures: 09/22/22: Stent placement to the AV graft  10/13/22: CT aspiration of right psoas muscle abscess yielding 2 ml purulent material  11/11/22: R Femoral V catheter placement 11/16/22: Permcath placement to L IJ       ASSESSMENT & PLAN:   Principal Problem:   Back pain Active Problems:   Anemia   Bilateral leg pain   ESRD on dialysis [facility])   Essential hypertension   Dyslipidemia   Seizure disorder (HCC)   Diabetic retinopathy without macular edema associated with type 2 diabetes mellitus  (HCC)   GERD (gastroesophageal reflux disease)   Diabetic neuropathy (HCC)   Anemia of chronic disease   Insulin-requiring or dependent type II diabetes mellitus (HCC)   Discitis of lumbar region   Myositis   Psoas abscess (HCC)   Lumbar discitis, 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   Wound cx growing staph capitis.   ID consulted.  Abx de-escalated to cefazolin.   Not a good surgical candidate as per neuro surg.   Pt having severe low back and bilateral leg pain cont cefazolin, for 6 weeks, End Date: 12/02/22 pain management per orders -- palliative care following transitioned from fentanyl patch + IV PRN >>> scheduled + PRN Norco on 4/17, since staff have not been using PRN fentanyl doses apparently for concern of side effects and/or hypotension.  patient cannot tolerate any position except laying nearly flat in the bed     Confusion and agitation Hospital delirium Pt was agitated, and also not responding to questions or commands during early hospitalization.  Mental status did start to improve, and now pt is calm and interactive, however, at this time she has been having waxing and waning mental status Delirium precautions cont seroquel 50 mg nightly to help with delirium and sleep.     ESRD: on HD MWF.  Pt refused HD on 10/17/22, 10/18/22 and 3/27, 4/1.   Since then, has been agreeable to dialysis without further issues, getting dialysis in hospital bed. When attempts made to do HD in chair, she  did not tolerate on account of severe back pain Recommend consideration for home hemodialysis if no facility capable to do HD in the bed   Generalized weakness / Physical Debility:  PT recs SNF. TOC following. Pt is bedridden and very high risk for developing pressure injuries of skin -- reposition frequently.   Monitor pressures  areas on skin and offload as possible.   Severe back and bilateral leg pain due to severe multi-level lumbar degenerative disc disease. Pt has bulging discs at L2-3, L3-4, L4-5 and L5-S1. Pt will continue to have back and leg pain once antibiotics are complete. Pain mgmt per orders  Pain control complicated by AMS and hypotension with escalation   ACD:  likely secondary ESRD. H&H are stable  Monitor periodic CBC   Hypokalemia Replace as needed Monitor BMP   Peripheral neuropathy --  Continue low dose gabapentin and monitor   Hx of seizures:  continue on home dose of Keppra   HLD:  continue on statin    Hx of COPD: w/o exacerbation.  Continue on bronchodilators    DM2: well controlled HbA1c 5.2.  d/c'ed BG checks    HTN:  Antihypertensives as blood pressure allows   GERD:  continue on PPI    Depression: severity unknown.  Continue on home dose of sertraline     Overweight: BMI 28.2.  Wt loss efforts recommended   Right foot pain  see peripheral neuropathy   DVT prophylaxis: heparin sq Pertinent IV fluids/nutrition: no continuous IV fluids  Central lines / invasive devices: L IJ HD cath, port R chest   Code Status: DNR ACP documentation reviewed: 05/01 - MOST and advanced directive reviewed, [name] is HCPOA  Current Admission Status: inpatient  TOC needs / Dispo plan: SNF Barriers to discharge / significant pending items: unable to sit to toelrate outpatient HD, working w/ PT              Subjective / Brief ROS:  Patient reports no concerns  Denies CP/SOB.      Family Communication: none at this time    Objective Findings:  Vitals:   11/25/22 1530 11/25/22 1545 11/25/22 1600 11/25/22 1627  BP: 110/64 112/86 (!) 127/57 105/60  Pulse: 81 84 89 80  Resp: 15   16  Temp:   98.4 F (36.9 C) 98.5 F (36.9 C)  TempSrc:   Oral   SpO2: 96% 100% 100% 100%  Weight:   87 kg   Height:        Intake/Output Summary (Last 24 hours)  at 11/25/2022 1632 Last data filed at 11/25/2022 1600 Gross per 24 hour  Intake 100 ml  Output 500 ml  Net -400 ml   Filed Weights   11/23/22 1157 11/25/22 1215 11/25/22 1600  Weight: 88.8 kg 87.4 kg 87 kg    Examination:  Physical Exam Constitutional:      General: She is not in acute distress.    Appearance: She is obese.  Cardiovascular:     Rate and Rhythm: Normal rate and regular rhythm.  Pulmonary:     Effort: Pulmonary effort is normal. No respiratory distress.     Breath sounds: Normal breath sounds.  Neurological:     Mental Status: Mental status is at baseline.          Scheduled Medications:   (feeding supplement) PROSource Plus  30 mL Oral BID BM   Chlorhexidine Gluconate Cloth  6 each Topical Q0600   cinacalcet  30 mg Oral  Q supper   epoetin (EPOGEN/PROCRIT) injection  4,000 Units Intravenous Q M,W,F-HD   feeding supplement (NEPRO CARB STEADY)  237 mL Oral TID BM   fentaNYL  1 patch Transdermal Q72H   gabapentin  100 mg Oral Once per day on Mon Wed Fri   Gerhardt's butt cream   Topical QID   levETIRAcetam  1,000 mg Oral Daily   levETIRAcetam  250 mg Oral Q M,W,F   metoprolol succinate  100 mg Oral Daily   pantoprazole  20 mg Oral BID   psyllium  1 packet Oral Daily   QUEtiapine  25 mg Oral QHS   sertraline  25 mg Oral Daily   sodium chloride flush  10-40 mL Intracatheter Q12H   umeclidinium-vilanterol  1 puff Inhalation Daily    Continuous Infusions:  sodium chloride Stopped (11/16/22 1155)   anticoagulant sodium citrate      ceFAZolin (ANCEF) IV Stopped (11/21/22 1200)    ceFAZolin (ANCEF) IV Stopped (11/23/22 1121)    ceFAZolin (ANCEF) IV Stopped (11/25/22 1534)    PRN Medications:  alteplase, anticoagulant sodium citrate, hydrALAZINE, lidocaine (PF), lidocaine-prilocaine, LORazepam, ondansetron, phenol, senna-docusate, sodium chloride flush  Antimicrobials from admission:  Anti-infectives (From admission, onward)    Start     Dose/Rate  Route Frequency Ordered Stop   11/18/22 1200  ceFAZolin (ANCEF) IVPB 3g/100 mL premix        3 g 200 mL/hr over 30 Minutes Intravenous Every Fri (Hemodialysis) 11/13/22 1039     11/17/22 0000  ceFAZolin (ANCEF) IVPB 1 g/50 mL premix       Note to Pharmacy: Send with pt to OR   1 g 100 mL/hr over 30 Minutes Intravenous On call 11/16/22 1154 11/16/22 1500   11/16/22 1200  ceFAZolin (ANCEF) IVPB 2g/100 mL premix        2 g 200 mL/hr over 30 Minutes Intravenous Every Wed (Hemodialysis) 11/13/22 1039     11/14/22 1200  ceFAZolin (ANCEF) IVPB 2g/100 mL premix        2 g 200 mL/hr over 30 Minutes Intravenous Every Mon (Hemodialysis) 11/13/22 1039     11/11/22 2200  ceFAZolin (ANCEF) IVPB 1 g/50 mL premix        1 g 100 mL/hr over 30 Minutes Intravenous Daily at 10 pm 11/10/22 1655 11/13/22 2320   11/09/22 1200  ceFAZolin (ANCEF) IVPB 2g/100 mL premix  Status:  Discontinued        2 g 200 mL/hr over 30 Minutes Intravenous Every Wed (Hemodialysis) 11/02/22 1246 11/03/22 1111   11/07/22 1700  ceFAZolin (ANCEF) IVPB 2g/100 mL premix        2 g 200 mL/hr over 30 Minutes Intravenous  Once 11/07/22 1610 11/07/22 1827   11/07/22 1200  ceFAZolin (ANCEF) IVPB 2g/100 mL premix  Status:  Discontinued        2 g 200 mL/hr over 30 Minutes Intravenous Every Mon (Hemodialysis) 11/02/22 1246 11/10/22 1655   11/04/22 1200  ceFAZolin (ANCEF) IVPB 3g/100 mL premix  Status:  Discontinued        3 g 200 mL/hr over 30 Minutes Intravenous Every Fri (Hemodialysis) 11/02/22 1246 11/10/22 1655   11/02/22 2200  ceFAZolin (ANCEF) IVPB 1 g/50 mL premix  Status:  Discontinued        1 g 100 mL/hr over 30 Minutes Intravenous Daily at bedtime 11/01/22 1452 11/02/22 1246   11/02/22 1330  ceFAZolin (ANCEF) IVPB 2g/100 mL premix  Status:  Discontinued  2 g 200 mL/hr over 30 Minutes Intravenous Every Wed (Hemodialysis) 11/02/22 1320 11/10/22 1655   11/02/22 1200  ceFAZolin (ANCEF) IVPB 2g/100 mL premix  Status:   Discontinued        2 g 200 mL/hr over 30 Minutes Intravenous Every Wed (Hemodialysis) 10/28/22 1004 11/01/22 1452   10/31/22 1200  ceFAZolin (ANCEF) IVPB 2g/100 mL premix  Status:  Discontinued        2 g 200 mL/hr over 30 Minutes Intravenous Every Mon (Hemodialysis) 10/28/22 1004 11/01/22 1452   10/28/22 1200  ceFAZolin (ANCEF) IVPB 3g/100 mL premix  Status:  Discontinued        3 g 200 mL/hr over 30 Minutes Intravenous Every Fri (Hemodialysis) 10/28/22 1004 11/01/22 1452   10/19/22 2200  ceFAZolin (ANCEF) IVPB 1 g/50 mL premix  Status:  Discontinued        1 g 100 mL/hr over 30 Minutes Intravenous Every 24 hours 10/18/22 0834 10/28/22 1004   10/18/22 1000  ceFAZolin (ANCEF) IVPB 2g/100 mL premix        2 g 200 mL/hr over 30 Minutes Intravenous  Once 10/18/22 0834 10/18/22 0946   10/17/22 1400  ceFEPIme (MAXIPIME) 1 g in sodium chloride 0.9 % 100 mL IVPB  Status:  Discontinued        1 g 200 mL/hr over 30 Minutes Intravenous Every 24 hours 10/17/22 1252 10/17/22 1520   10/14/22 1200  vancomycin (VANCOCIN) IVPB 1000 mg/200 mL premix  Status:  Discontinued        1,000 mg 200 mL/hr over 60 Minutes Intravenous Every M-W-F (Hemodialysis) 10/13/22 1356 10/19/22 0809   10/13/22 1400  ceFEPIme (MAXIPIME) 1 g in sodium chloride 0.9 % 100 mL IVPB  Status:  Discontinued        1 g 200 mL/hr over 30 Minutes Intravenous Every 24 hours 10/13/22 1140 10/17/22 1223   10/13/22 1230  vancomycin (VANCOREADY) IVPB 2000 mg/400 mL        2,000 mg 200 mL/hr over 120 Minutes Intravenous  Once 10/13/22 1140 10/13/22 1437   10/12/22 1645  vancomycin (VANCOREADY) IVPB 1500 mg/300 mL  Status:  Discontinued       See Hyperspace for full Linked Orders Report.   1,500 mg 150 mL/hr over 120 Minutes Intravenous  Once 10/12/22 1110 10/12/22 1753   10/12/22 1600  ceFEPIme (MAXIPIME) 1 g in sodium chloride 0.9 % 100 mL IVPB  Status:  Discontinued        1 g 200 mL/hr over 30 Minutes Intravenous Every 24 hours  10/12/22 1112 10/12/22 1753   10/12/22 1500  vancomycin (VANCOCIN) IVPB 1000 mg/200 mL premix  Status:  Discontinued       See Hyperspace for full Linked Orders Report.   1,000 mg 200 mL/hr over 60 Minutes Intravenous  Once 10/12/22 1110 10/12/22 1753   10/12/22 1115  ceFEPIme (MAXIPIME) 1 g in sodium chloride 0.9 % 100 mL IVPB  Status:  Discontinued        1 g 200 mL/hr over 30 Minutes Intravenous  Once 10/12/22 1110 10/12/22 1112           Data Reviewed:  I have personally reviewed the following...  CBC: Recent Labs  Lab 11/19/22 0439 11/21/22 0530 11/22/22 0610 11/23/22 0446 11/25/22 1224  WBC 6.2 7.7 5.0 7.0 7.4  NEUTROABS 3.4 4.6 2.6 3.1  --   HGB 9.0* 8.9* 6.4* 9.3* 9.6*  HCT 30.2* 29.5* 21.7* 31.4* 32.2*  MCV 82.7 82.4 84.4 82.4 84.5  PLT 144* 168 127* 189 230   Basic Metabolic Panel: Recent Labs  Lab 11/20/22 0502 11/21/22 0530 11/22/22 0610 11/23/22 0446 11/25/22 1224  NA 136 137 133* 133* 136  K 3.8 4.0 3.7 4.5 3.8  CL 99 99 97* 97* 98  CO2 27 26 24 25 27   GLUCOSE 148* 141* 101* 133* 145*  BUN 35* 45* 25* 37* 34*  CREATININE 6.25* 7.58* 5.03* 6.63* 6.75*  CALCIUM 10.1 10.2 9.5 10.0 10.1  PHOS  --   --   --   --  5.7*   GFR: Estimated Creatinine Clearance: 8.8 mL/min (A) (by C-G formula based on SCr of 6.75 mg/dL (H)). Liver Function Tests: Recent Labs  Lab 11/25/22 1224  ALBUMIN 2.5*   No results for input(s): "LIPASE", "AMYLASE" in the last 168 hours. No results for input(s): "AMMONIA" in the last 168 hours. Coagulation Profile: No results for input(s): "INR", "PROTIME" in the last 168 hours. Cardiac Enzymes: No results for input(s): "CKTOTAL", "CKMB", "CKMBINDEX", "TROPONINI" in the last 168 hours. BNP (last 3 results) No results for input(s): "PROBNP" in the last 8760 hours. HbA1C: No results for input(s): "HGBA1C" in the last 72 hours. CBG: Recent Labs  Lab 11/19/22 2057  GLUCAP 131*   Lipid Profile: No results for input(s):  "CHOL", "HDL", "LDLCALC", "TRIG", "CHOLHDL", "LDLDIRECT" in the last 72 hours. Thyroid Function Tests: No results for input(s): "TSH", "T4TOTAL", "FREET4", "T3FREE", "THYROIDAB" in the last 72 hours. Anemia Panel: No results for input(s): "VITAMINB12", "FOLATE", "FERRITIN", "TIBC", "IRON", "RETICCTPCT" in the last 72 hours. Most Recent Urinalysis On File:     Component Value Date/Time   COLORURINE YELLOW (A) 03/20/2022 0125   APPEARANCEUR CLEAR (A) 03/20/2022 0125   APPEARANCEUR Clear 06/14/2014 1510   LABSPEC 1.010 03/20/2022 0125   LABSPEC 1.006 06/14/2014 1510   PHURINE 7.0 03/20/2022 0125   GLUCOSEU 50 (A) 03/20/2022 0125   GLUCOSEU >=500 06/14/2014 1510   HGBUR MODERATE (A) 03/20/2022 0125   BILIRUBINUR NEGATIVE 03/20/2022 0125   BILIRUBINUR Negative 06/14/2014 1510   KETONESUR NEGATIVE 03/20/2022 0125   PROTEINUR 100 (A) 03/20/2022 0125   NITRITE NEGATIVE 03/20/2022 0125   LEUKOCYTESUR NEGATIVE 03/20/2022 0125   LEUKOCYTESUR Negative 06/14/2014 1510   Sepsis Labs: @LABRCNTIP (procalcitonin:4,lacticidven:4) Microbiology: No results found for this or any previous visit (from the past 240 hour(s)).    Radiology Studies last 3 days: No results found.           LOS: 44 days      Sunnie Nielsen, DO [facility] 11/25/2022, 4:32 PM    Dictation software may have been used to generate the above note. Typos may occur and escape review in typed/dictated notes. Please contact Dr Lyn Hollingshead directly for clarity if needed.  Staff may message me via secure chat in Epic  but this may not receive an immediate response,  please page me for urgent matters!  If 7PM-7AM, please contact night coverage [email or web address]

## 2022-11-26 DIAGNOSIS — M79604 Pain in right leg: Secondary | ICD-10-CM | POA: Diagnosis not present

## 2022-11-26 DIAGNOSIS — D649 Anemia, unspecified: Secondary | ICD-10-CM | POA: Diagnosis not present

## 2022-11-26 DIAGNOSIS — M5442 Lumbago with sciatica, left side: Secondary | ICD-10-CM | POA: Diagnosis not present

## 2022-11-26 DIAGNOSIS — E1142 Type 2 diabetes mellitus with diabetic polyneuropathy: Secondary | ICD-10-CM | POA: Diagnosis not present

## 2022-11-26 NOTE — Progress Notes (Signed)
[facility]  ROUNDING NOTE   Subjective:   Cynthia Dean is a 72 y.o. 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 She presents to the ED with worsening lower extremity pain. Patient will be admitted for Back pain [M54.9] Bilateral leg pain [M79.604, M79.605]  Patient seen laying in bed No family at bedside Denies pain   Objective:  Vital signs in last 24 hours:  Temp:  [97.9 F (36.6 C)-98.6 F (37 C)] 97.9 F (36.6 C) (05/04 0357) Pulse Rate:  [70-89] 81 (05/04 0357) Resp:  [13-20] 16 (05/04 0357) BP: (96-150)/(57-86) 140/72 (05/04 0357) SpO2:  [94 %-100 %] 100 % (05/04 0357) Weight:  [87 kg-87.4 kg] 87 kg (05/03 1600)  Weight change:  Filed Weights   11/23/22 1157 11/25/22 1215 11/25/22 1600  Weight: 88.8 kg 87.4 kg 87 kg    Intake/Output: I/O last 3 completed shifts: In: 100 [P.O.:100] Out: 500 [Other:500]   Intake/Output this shift:  No intake/output data recorded.  Physical Exam: General: NAD  Head: Normocephalic, atraumatic.  Moist oral mucosal membranes  Eyes: Anicteric  Lungs:  Clear to auscultation  Heart: Regular rate and rhythm  Abdomen:  Soft, nontender  Extremities: No peripheral edema.  Neurologic: moving all extremities   Skin: No lesions  Access: Left AVG (no bruit/thrill)), left chest PermCath    Basic Metabolic Panel: Recent Labs  Lab 11/20/22 0502 11/21/22 0530 11/22/22 0610 11/23/22 0446 11/25/22 1224  NA 136 137 133* 133* 136  K 3.8 4.0 3.7 4.5 3.8  CL 99 99 97* 97* 98  CO2 27 26 24 25 27   GLUCOSE 148* 141* 101* 133* 145*  BUN 35* 45* 25* 37* 34*  CREATININE 6.25* 7.58* 5.03* 6.63* 6.75*  CALCIUM 10.1 10.2 9.5 10.0 10.1  PHOS  --   --   --   --  5.7*     Liver Function Tests: Recent Labs  Lab 11/25/22 1224  ALBUMIN 2.5*    No results for input(s): "LIPASE", "AMYLASE"  in the last 168 hours. No results for input(s): "AMMONIA" in the last 168 hours.  CBC: Recent Labs  Lab 11/21/22 0530 11/22/22 0610 11/23/22 0446 11/25/22 1224  WBC 7.7 5.0 7.0 7.4  NEUTROABS 4.6 2.6 3.1  --   HGB 8.9* 6.4* 9.3* 9.6*  HCT 29.5* 21.7* 31.4* 32.2*  MCV 82.4 84.4 82.4 84.5  PLT 168 127* 189 230     Cardiac Enzymes: No results for input(s): "CKTOTAL", "CKMB", "CKMBINDEX", "TROPONINI" in the last 168 hours.  BNP: Invalid input(s): "POCBNP"  CBG: Recent Labs  Lab 11/19/22 2057  GLUCAP 131*     Microbiology: Results for orders placed or performed during the hospital encounter of 10/12/22  Blood culture (routine x 2)     Status: None   Collection Time: 10/12/22  5:40 PM   Specimen: BLOOD  Result Value Ref Range Status   Specimen Description BLOOD BLOOD RIGHT HAND [facility]  Final   Special Requests   Final    BOTTLES DRAWN AEROBIC AND ANAEROBIC Blood Culture results may not be optimal due to an inadequate volume of blood received in culture bottles   Culture   Final    NO GROWTH 5 DAYS Performed at [facility], [address], [place], [place] [address]    Report Status 10/17/2022 FINAL  Final  Blood culture (routine x 2)     Status: None  Collection Time: 10/12/22  6:56 PM   Specimen: BLOOD  Result Value Ref Range Status   Specimen Description BLOOD BLOOD RIGHT FOREARM  Final   Special Requests   Final    BOTTLES DRAWN AEROBIC ONLY Blood Culture results may not be optimal due to an inadequate volume of blood received in culture bottles   Culture   Final    NO GROWTH 5 DAYS Performed at [facility], [address], [place], [place] [address]    Report Status 10/17/2022 FINAL  Final  Aerobic/Anaerobic Culture w Gram Stain (surgical/deep wound)     Status: None   Collection Time: 10/13/22 11:44 AM   Specimen: Abscess  Result Value Ref Range Status   Specimen Description   Final    ABSCESS Performed at [facility], [address], [place], [place] [address]    Special Requests   Final    MUSCLE ABSCESS Performed at [facility], [address] [place], [place] [address]    Gram Stain   Final    ABUNDANT WBC PRESENT, PREDOMINANTLY PMN NO ORGANISMS SEEN    Culture   Final    FEW STAPHYLOCOCCUS CAPITIS NO ANAEROBES ISOLATED Performed at [facility], [address], [place], [place] [address]    Report Status 10/18/2022 FINAL  Final   Organism ID, Bacteria STAPHYLOCOCCUS CAPITIS  Final      Susceptibility   Staphylococcus capitis - MIC*    CIPROFLOXACIN <=0.5 SENSITIVE Sensitive     ERYTHROMYCIN >=8 RESISTANT Resistant     GENTAMICIN <=0.5 SENSITIVE Sensitive     OXACILLIN <=0.25 SENSITIVE Sensitive     TETRACYCLINE <=1 SENSITIVE Sensitive     VANCOMYCIN 1 SENSITIVE Sensitive     TRIMETH/SULFA <=10 SENSITIVE Sensitive     CLINDAMYCIN INTERMEDIATE Intermediate     RIFAMPIN <=0.5 SENSITIVE Sensitive     Inducible Clindamycin NEGATIVE Sensitive     * FEW STAPHYLOCOCCUS CAPITIS    Coagulation Studies: No results for input(s): "LABPROT", "INR" in the last 72 hours.  Urinalysis: No results for input(s): "COLORURINE", "LABSPEC", "PHURINE", "GLUCOSEU", "HGBUR", "BILIRUBINUR", "KETONESUR", "PROTEINUR", "UROBILINOGEN", "NITRITE", "LEUKOCYTESUR" in the last 72 hours.  Invalid input(s): "APPERANCEUR"    Imaging: No results found.   Medications:    sodium chloride Stopped (11/16/22 1155)   anticoagulant sodium citrate      ceFAZolin (ANCEF) IV Stopped (11/21/22 1200)    ceFAZolin (ANCEF) IV Stopped (11/23/22 1121)    ceFAZolin (ANCEF) IV Stopped (11/25/22 1534)    (feeding supplement) PROSource Plus  30 mL Oral BID BM   Chlorhexidine Gluconate Cloth  6 each Topical Q0600   cinacalcet  30 mg Oral Q supper   epoetin (EPOGEN/PROCRIT) injection  4,000 Units Intravenous Q M,W,F-HD   feeding supplement (NEPRO CARB STEADY)  237 mL Oral TID BM   fentaNYL  1 patch Transdermal Q72H    gabapentin  100 mg Oral Once per day on Mon Wed Fri   Gerhardt's butt cream   Topical QID   levETIRAcetam  1,000 mg Oral Daily   levETIRAcetam  250 mg Oral Q M,W,F   metoprolol succinate  100 mg Oral Daily   pantoprazole  20 mg Oral BID   psyllium  1 packet Oral Daily   QUEtiapine  25 mg Oral QHS   sertraline  25 mg Oral Daily   sodium chloride flush  10-40 mL Intracatheter Q12H   umeclidinium-vilanterol  1 puff Inhalation Daily   alteplase, anticoagulant sodium citrate,  fentaNYL (SUBLIMAZE) injection, hydrALAZINE, lidocaine (PF), lidocaine-prilocaine, LORazepam, ondansetron, phenol, senna-docusate, sodium chloride flush  Assessment/ Plan:  [name] is a 72 y.o.  female with end stage renal disease on hemodialysis, coronary artery disease, COPD with intermittent home O2, hypertension, hyperlipidemia, stroke, diabetes mellitus type II, and breast cancer who was admitted to [facility] on 10/12/2022 for Back pain [M54.9] Bilateral leg pain [M79.604, M79.605]  Patient was found to have vertebral osteomyelitis and psoas abscess. Cultures positive for staph capitis  [facility] MWF Left AVG 102.5kg  End-stage renal disease on hemodialysis. on MWF schedule.     Received dialysis yesterday, UF 0.5L achieved. Next treatment scheduled for Monday.   Will consider appropriateness of HHD for this patient.   Anemia of chronic kidney disease Normocytic Lab Results  Component Value Date   HGB 9.6 (L) 11/25/2022  Patient receives Mircera at outpatient clinic.  Continue low dose EPO with dialysis.   Secondary Hyperparathyroidism: with outpatient labs: PTH 180 (3/11), phosphorus 5.4, calcium 9.3   Lab Results  Component Value Date   PTH 131 (H) 03/14/2018   CALCIUM 10.1 11/25/2022   CAION 1.09 (L) 08/28/2022   PHOS 5.7 (H) 11/25/2022   -Continue Cinacalcet. -Phosphorus slightly elevated, will continue to monitor.   Hypotension during hemodialysis treatment.   Blood pressure 140/72  Diabetes mellitus type II with chronic kidney disease/renal manifestations: insulin dependent, NPH.    Vertebral osteomyelitis, seen on CT lumber spine. Neurosurgery feels patient is not a surgical candidate CT guided aspiration of psoas abscess on 10/13/22. Cultures positive for Staph Capitis.   -Infectious disease recommending cefazolin with dialysis treatments, until 12/02/22.    LOS: 45   5/4/20249:39 AM

## 2022-11-26 NOTE — Progress Notes (Signed)
PROGRESS NOTE    Cynthia Dean   ZOX:[id number] DOB: 08-22-1950  DOA: 10/12/2022 Date of Service: 11/26/22 PCP: Loistine Chance, MD     Brief Narrative / Hospital Course:  Cynthia Dean is a 72 y.o. 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 She presents to the ED with worsening lower extremity pain. Admitted for possible Discitis/osteomyelitis at L4-L5: w/ suspected intradiscal abscess as per MRI. 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   4/18, HD access / fistula clotted off during dialysis. 4/19 pt taken for procedure to address fistula malfunction, but was agitated and combative, resfused procedure. 4/19 vascular placed R femoral temp HD cath placed, resumed HD and pemcath placed 4/24  Patient has had waxing and waning mental status with intermittently refusing dialysis. Patient's family have decided to continue hemodialysis after speaking with their PCP. Hospital course prolonged, complicated by patient's inability to tolerate sitting in recliner chair for dialysis.  She only agrees to dialysis in the bed.  This is a barrier to discharge as no outpatient dialysis centers can accommodate.      5/3: titrating up on fentanyl patch 12.5 --> 25 mcg    Consultants:  Neurosurgery Infectious Disease Vascular surgery  Nephrology   Procedures: 09/22/22: Stent placement to the AV graft  10/13/22: CT aspiration of right psoas muscle abscess yielding 2 ml purulent material  11/11/22: R Femoral V catheter placement 11/16/22: Permcath placement to L IJ       ASSESSMENT & PLAN:   Principal Problem:   Back pain Active Problems:   Anemia   Bilateral leg pain   ESRD on dialysis [facility])   Essential hypertension   Dyslipidemia   Seizure disorder (HCC)   Diabetic retinopathy  without macular edema associated with type 2 diabetes mellitus (HCC)   GERD (gastroesophageal reflux disease)   Diabetic neuropathy (HCC)   Anemia of chronic disease   Insulin-requiring or dependent type II diabetes mellitus (HCC)   Discitis of lumbar region   Myositis   Psoas abscess (HCC)   Lumbar discitis, 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   Wound cx growing staph capitis.   ID consulted.  Abx de-escalated to cefazolin.   Not a good surgical candidate as per neuro surg.   Pt having severe low back and bilateral leg pain cont cefazolin, for 6 weeks, End Date: 12/02/22 pain management per orders -- palliative care following patient cannot tolerate any position except laying nearly flat in the bed     Confusion and agitation Hospital delirium Pt was agitated, and also not responding to questions or commands during early hospitalization.  Mental status did start to improve, and now pt is calm and interactive, however, at this time she has been having waxing and waning mental status Delirium precautions cont seroquel 50 mg nightly to help with delirium and sleep.     ESRD: on HD MWF.  Pt refused HD on 10/17/22, 10/18/22 and 3/27, 4/1.   Since then, has been agreeable to dialysis without further issues, getting dialysis in hospital bed. When attempts made to do HD in chair, she did not tolerate on account of severe back pain Recommend consideration for home hemodialysis if no facility capable to  do HD in the bed   Generalized weakness / Physical Debility:  PT recs SNF. TOC following. Pt is bedridden and very high risk for developing pressure injuries of skin -- reposition frequently.   Monitor pressures areas on skin and offload as possible.   Severe back and bilateral leg pain due to severe multi-level lumbar degenerative  disc disease. Pt has bulging discs at L2-3, L3-4, L4-5 and L5-S1. Pt will continue to have back and leg pain once antibiotics are complete. Pain mgmt per orders  Pain control complicated by AMS and hypotension with escalation - titrating up on fentanyl patch, cautious use of IV fentanyl for breakthrough pain    ACD:  likely secondary ESRD. H&H are stable  Monitor periodic CBC   Hypokalemia Replace as needed Monitor BMP   Peripheral neuropathy --  Continue low dose gabapentin and monitor   Hx of seizures:  continue on home dose of Keppra   HLD:  continue on statin    Hx of COPD: w/o exacerbation.  Continue on bronchodilators    DM2: well controlled HbA1c 5.2.  d/c'ed BG checks    HTN:  Antihypertensives as blood pressure allows   GERD:  continue on PPI    Depression: severity unknown.  Continue on home dose of sertraline     Overweight: BMI 28.2.  Wt loss efforts recommended   Right foot pain  see peripheral neuropathy   DVT prophylaxis: heparin sq Pertinent IV fluids/nutrition: no continuous IV fluids  Central lines / invasive devices: L IJ HD cath, port R chest   Code Status: DNR ACP documentation reviewed: 05/01 - MOST and advanced directive reviewed, [name] is HCPOA  Current Admission Status: inpatient  TOC needs / Dispo plan: SNF Barriers to discharge / significant pending items: unable to sit to toelrate outpatient HD, working w/ PT              Subjective / Brief ROS:  Patient reports no concerns states she's not feeling great but will not give any details about why Denies CP/SOB.      Family Communication: none at this time    Objective Findings:  Vitals:   11/25/22 1627 11/25/22 2035 11/26/22 0357 11/26/22 0913  BP: 105/60 (!) 105/58 (!) 140/72 131/68  Pulse: 80 84 81 93  Resp: 16 20 16 17   Temp: 98.5 F (36.9 C) 98.6 F (37 C) 97.9 F (36.6 C) 97.9 F (36.6 C)  TempSrc:      SpO2: 100% 94% 100% 100%  Weight:       Height:        Intake/Output Summary (Last 24 hours) at 11/26/2022 1148 Last data filed at 11/25/2022 1600 Gross per 24 hour  Intake --  Output 500 ml  Net -500 ml   Filed Weights   11/23/22 1157 11/25/22 1215 11/25/22 1600  Weight: 88.8 kg 87.4 kg 87 kg    Examination:  Physical Exam Constitutional:      General: She is not in acute distress.    Appearance: She is obese.  Cardiovascular:     Rate and Rhythm: Normal rate and regular rhythm.  Pulmonary:     Effort: Pulmonary effort is normal. No respiratory distress.     Breath sounds: Normal breath sounds.  Neurological:     General: No focal deficit present.     Mental Status: She is alert. Mental status is at baseline.          Scheduled Medications:   (feeding supplement)  PROSource Plus  30 mL Oral BID BM   Chlorhexidine Gluconate Cloth  6 each Topical Q0600   cinacalcet  30 mg Oral Q supper   epoetin (EPOGEN/PROCRIT) injection  4,000 Units Intravenous Q M,W,F-HD   feeding supplement (NEPRO CARB STEADY)  237 mL Oral TID BM   fentaNYL  1 patch Transdermal Q72H   gabapentin  100 mg Oral Once per day on Mon Wed Fri   Gerhardt's butt cream   Topical QID   levETIRAcetam  1,000 mg Oral Daily   levETIRAcetam  250 mg Oral Q M,W,F   metoprolol succinate  100 mg Oral Daily   pantoprazole  20 mg Oral BID   psyllium  1 packet Oral Daily   QUEtiapine  25 mg Oral QHS   sertraline  25 mg Oral Daily   sodium chloride flush  10-40 mL Intracatheter Q12H   umeclidinium-vilanterol  1 puff Inhalation Daily    Continuous Infusions:  sodium chloride Stopped (11/16/22 1155)   anticoagulant sodium citrate      ceFAZolin (ANCEF) IV Stopped (11/21/22 1200)    ceFAZolin (ANCEF) IV Stopped (11/23/22 1121)    ceFAZolin (ANCEF) IV Stopped (11/25/22 1534)    PRN Medications:  alteplase, anticoagulant sodium citrate, fentaNYL (SUBLIMAZE) injection, hydrALAZINE, lidocaine (PF), lidocaine-prilocaine, LORazepam, ondansetron, phenol,  senna-docusate, sodium chloride flush  Antimicrobials from admission:  Anti-infectives (From admission, onward)    Start     Dose/Rate Route Frequency Ordered Stop   11/18/22 1200  ceFAZolin (ANCEF) IVPB 3g/100 mL premix        3 g 200 mL/hr over 30 Minutes Intravenous Every Fri (Hemodialysis) 11/13/22 1039     11/17/22 0000  ceFAZolin (ANCEF) IVPB 1 g/50 mL premix       Note to Pharmacy: Send with pt to OR   1 g 100 mL/hr over 30 Minutes Intravenous On call 11/16/22 1154 11/16/22 1500   11/16/22 1200  ceFAZolin (ANCEF) IVPB 2g/100 mL premix        2 g 200 mL/hr over 30 Minutes Intravenous Every Wed (Hemodialysis) 11/13/22 1039     11/14/22 1200  ceFAZolin (ANCEF) IVPB 2g/100 mL premix        2 g 200 mL/hr over 30 Minutes Intravenous Every Mon (Hemodialysis) 11/13/22 1039     11/11/22 2200  ceFAZolin (ANCEF) IVPB 1 g/50 mL premix        1 g 100 mL/hr over 30 Minutes Intravenous Daily at 10 pm 11/10/22 1655 11/13/22 2320   11/09/22 1200  ceFAZolin (ANCEF) IVPB 2g/100 mL premix  Status:  Discontinued        2 g 200 mL/hr over 30 Minutes Intravenous Every Wed (Hemodialysis) 11/02/22 1246 11/03/22 1111   11/07/22 1700  ceFAZolin (ANCEF) IVPB 2g/100 mL premix        2 g 200 mL/hr over 30 Minutes Intravenous  Once 11/07/22 1610 11/07/22 1827   11/07/22 1200  ceFAZolin (ANCEF) IVPB 2g/100 mL premix  Status:  Discontinued        2 g 200 mL/hr over 30 Minutes Intravenous Every Mon (Hemodialysis) 11/02/22 1246 11/10/22 1655   11/04/22 1200  ceFAZolin (ANCEF) IVPB 3g/100 mL premix  Status:  Discontinued        3 g 200 mL/hr over 30 Minutes Intravenous Every Fri (Hemodialysis) 11/02/22 1246 11/10/22 1655   11/02/22 2200  ceFAZolin (ANCEF) IVPB 1 g/50 mL premix  Status:  Discontinued        1 g 100 mL/hr over 30 Minutes Intravenous  Daily at bedtime 11/01/22 1452 11/02/22 1246   11/02/22 1330  ceFAZolin (ANCEF) IVPB 2g/100 mL premix  Status:  Discontinued        2 g 200 mL/hr over 30 Minutes  Intravenous Every Wed (Hemodialysis) 11/02/22 1320 11/10/22 1655   11/02/22 1200  ceFAZolin (ANCEF) IVPB 2g/100 mL premix  Status:  Discontinued        2 g 200 mL/hr over 30 Minutes Intravenous Every Wed (Hemodialysis) 10/28/22 1004 11/01/22 1452   10/31/22 1200  ceFAZolin (ANCEF) IVPB 2g/100 mL premix  Status:  Discontinued        2 g 200 mL/hr over 30 Minutes Intravenous Every Mon (Hemodialysis) 10/28/22 1004 11/01/22 1452   10/28/22 1200  ceFAZolin (ANCEF) IVPB 3g/100 mL premix  Status:  Discontinued        3 g 200 mL/hr over 30 Minutes Intravenous Every Fri (Hemodialysis) 10/28/22 1004 11/01/22 1452   10/19/22 2200  ceFAZolin (ANCEF) IVPB 1 g/50 mL premix  Status:  Discontinued        1 g 100 mL/hr over 30 Minutes Intravenous Every 24 hours 10/18/22 0834 10/28/22 1004   10/18/22 1000  ceFAZolin (ANCEF) IVPB 2g/100 mL premix        2 g 200 mL/hr over 30 Minutes Intravenous  Once 10/18/22 0834 10/18/22 0946   10/17/22 1400  ceFEPIme (MAXIPIME) 1 g in sodium chloride 0.9 % 100 mL IVPB  Status:  Discontinued        1 g 200 mL/hr over 30 Minutes Intravenous Every 24 hours 10/17/22 1252 10/17/22 1520   10/14/22 1200  vancomycin (VANCOCIN) IVPB 1000 mg/200 mL premix  Status:  Discontinued        1,000 mg 200 mL/hr over 60 Minutes Intravenous Every M-W-F (Hemodialysis) 10/13/22 1356 10/19/22 0809   10/13/22 1400  ceFEPIme (MAXIPIME) 1 g in sodium chloride 0.9 % 100 mL IVPB  Status:  Discontinued        1 g 200 mL/hr over 30 Minutes Intravenous Every 24 hours 10/13/22 1140 10/17/22 1223   10/13/22 1230  vancomycin (VANCOREADY) IVPB 2000 mg/400 mL        2,000 mg 200 mL/hr over 120 Minutes Intravenous  Once 10/13/22 1140 10/13/22 1437   10/12/22 1645  vancomycin (VANCOREADY) IVPB 1500 mg/300 mL  Status:  Discontinued       See Hyperspace for full Linked Orders Report.   1,500 mg 150 mL/hr over 120 Minutes Intravenous  Once 10/12/22 1110 10/12/22 1753   10/12/22 1600  ceFEPIme (MAXIPIME) 1 g  in sodium chloride 0.9 % 100 mL IVPB  Status:  Discontinued        1 g 200 mL/hr over 30 Minutes Intravenous Every 24 hours 10/12/22 1112 10/12/22 1753   10/12/22 1500  vancomycin (VANCOCIN) IVPB 1000 mg/200 mL premix  Status:  Discontinued       See Hyperspace for full Linked Orders Report.   1,000 mg 200 mL/hr over 60 Minutes Intravenous  Once 10/12/22 1110 10/12/22 1753   10/12/22 1115  ceFEPIme (MAXIPIME) 1 g in sodium chloride 0.9 % 100 mL IVPB  Status:  Discontinued        1 g 200 mL/hr over 30 Minutes Intravenous  Once 10/12/22 1110 10/12/22 1112           Data Reviewed:  I have personally reviewed the following...  CBC: Recent Labs  Lab 11/21/22 0530 11/22/22 0610 11/23/22 0446 11/25/22 1224  WBC 7.7 5.0 7.0 7.4  NEUTROABS 4.6 2.6  3.1  --   HGB 8.9* 6.4* 9.3* 9.6*  HCT 29.5* 21.7* 31.4* 32.2*  MCV 82.4 84.4 82.4 84.5  PLT 168 127* 189 230   Basic Metabolic Panel: Recent Labs  Lab 11/20/22 0502 11/21/22 0530 11/22/22 0610 11/23/22 0446 11/25/22 1224  NA 136 137 133* 133* 136  K 3.8 4.0 3.7 4.5 3.8  CL 99 99 97* 97* 98  CO2 27 26 24 25 27   GLUCOSE 148* 141* 101* 133* 145*  BUN 35* 45* 25* 37* 34*  CREATININE 6.25* 7.58* 5.03* 6.63* 6.75*  CALCIUM 10.1 10.2 9.5 10.0 10.1  PHOS  --   --   --   --  5.7*   GFR: Estimated Creatinine Clearance: 8.8 mL/min (A) (by C-G formula based on SCr of 6.75 mg/dL (H)). Liver Function Tests: Recent Labs  Lab 11/25/22 1224  ALBUMIN 2.5*   No results for input(s): "LIPASE", "AMYLASE" in the last 168 hours. No results for input(s): "AMMONIA" in the last 168 hours. Coagulation Profile: No results for input(s): "INR", "PROTIME" in the last 168 hours. Cardiac Enzymes: No results for input(s): "CKTOTAL", "CKMB", "CKMBINDEX", "TROPONINI" in the last 168 hours. BNP (last 3 results) No results for input(s): "PROBNP" in the last 8760 hours. HbA1C: No results for input(s): "HGBA1C" in the last 72 hours. CBG: Recent Labs   Lab 11/19/22 2057  GLUCAP 131*   Lipid Profile: No results for input(s): "CHOL", "HDL", "LDLCALC", "TRIG", "CHOLHDL", "LDLDIRECT" in the last 72 hours. Thyroid Function Tests: No results for input(s): "TSH", "T4TOTAL", "FREET4", "T3FREE", "THYROIDAB" in the last 72 hours. Anemia Panel: No results for input(s): "VITAMINB12", "FOLATE", "FERRITIN", "TIBC", "IRON", "RETICCTPCT" in the last 72 hours. Most Recent Urinalysis On File:     Component Value Date/Time   COLORURINE YELLOW (A) 03/20/2022 0125   APPEARANCEUR CLEAR (A) 03/20/2022 0125   APPEARANCEUR Clear 06/14/2014 1510   LABSPEC 1.010 03/20/2022 0125   LABSPEC 1.006 06/14/2014 1510   PHURINE 7.0 03/20/2022 0125   GLUCOSEU 50 (A) 03/20/2022 0125   GLUCOSEU >=500 06/14/2014 1510   HGBUR MODERATE (A) 03/20/2022 0125   BILIRUBINUR NEGATIVE 03/20/2022 0125   BILIRUBINUR Negative 06/14/2014 1510   KETONESUR NEGATIVE 03/20/2022 0125   PROTEINUR 100 (A) 03/20/2022 0125   NITRITE NEGATIVE 03/20/2022 0125   LEUKOCYTESUR NEGATIVE 03/20/2022 0125   LEUKOCYTESUR Negative 06/14/2014 1510   Sepsis Labs: @LABRCNTIP (procalcitonin:4,lacticidven:4) Microbiology: No results found for this or any previous visit (from the past 240 hour(s)).    Radiology Studies last 3 days: No results found.           LOS: 45 days      Sunnie Nielsen, DO [facility] 11/26/2022, 11:48 AM    Dictation software may have been used to generate the above note. Typos may occur and escape review in typed/dictated notes. Please contact Dr Lyn Hollingshead directly for clarity if needed.  Staff may message me via secure chat in Epic  but this may not receive an immediate response,  please page me for urgent matters!  If 7PM-7AM, please contact night coverage [email or web address]

## 2022-11-27 DIAGNOSIS — M79604 Pain in right leg: Secondary | ICD-10-CM | POA: Diagnosis not present

## 2022-11-27 DIAGNOSIS — D649 Anemia, unspecified: Secondary | ICD-10-CM | POA: Diagnosis not present

## 2022-11-27 DIAGNOSIS — M5442 Lumbago with sciatica, left side: Secondary | ICD-10-CM | POA: Diagnosis not present

## 2022-11-27 DIAGNOSIS — E1142 Type 2 diabetes mellitus with diabetic polyneuropathy: Secondary | ICD-10-CM | POA: Diagnosis not present

## 2022-11-27 MED ORDER — GUAIFENESIN 100 MG/5ML PO LIQD
5.0000 mL | ORAL | Status: DC | PRN
  Administered 2022-11-27 – 2022-12-22 (×3): 5 mL via ORAL
  Filled 2022-11-27 (×3): qty 10

## 2022-11-27 NOTE — Progress Notes (Signed)
PROGRESS NOTE    Cynthia Dean   WJX:[id number] DOB: 06/01/1951  DOA: 10/12/2022 Date of Service: 11/27/22 PCP: Loistine Chance, MD     Brief Narrative / Hospital Course:  Cynthia Dean is a 72 y.o. 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 She presents to the ED with worsening lower extremity pain. Admitted for possible Discitis/osteomyelitis at L4-L5: w/ suspected intradiscal abscess as per MRI. 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   4/18, HD access / fistula clotted off during dialysis. 4/19 pt taken for procedure to address fistula malfunction, but was agitated and combative, resfused procedure. 4/19 vascular placed R femoral temp HD cath placed, resumed HD and pemcath placed 4/24  Patient has had waxing and waning mental status with intermittently refusing dialysis. Patient's family have decided to continue hemodialysis after speaking with their PCP. Hospital course prolonged, complicated by patient's inability to tolerate sitting in recliner chair for dialysis.  She only agrees to dialysis in the bed.  This is a barrier to discharge as no outpatient dialysis centers can accommodate.      5/3: titrating up on fentanyl patch 12.5 --> 25 mcg    Consultants:  Neurosurgery Infectious Disease Vascular surgery  Nephrology   Procedures: 09/22/22: Stent placement to the AV graft  10/13/22: CT aspiration of right psoas muscle abscess yielding 2 ml purulent material  11/11/22: R Femoral V catheter placement 11/16/22: Permcath placement to L IJ       ASSESSMENT & PLAN:   Principal Problem:   Back pain Active Problems:   Anemia   Bilateral leg pain   ESRD on dialysis [facility])   Essential hypertension   Dyslipidemia   Seizure disorder (HCC)   Diabetic retinopathy  without macular edema associated with type 2 diabetes mellitus (HCC)   GERD (gastroesophageal reflux disease)   Diabetic neuropathy (HCC)   Anemia of chronic disease   Insulin-requiring or dependent type II diabetes mellitus (HCC)   Discitis of lumbar region   Myositis   Psoas abscess (HCC)   Lumbar discitis, 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   Wound cx growing staph capitis.   ID consulted.  Abx de-escalated to cefazolin.   Not a good surgical candidate as per neuro surg.   Pt having severe low back and bilateral leg pain cont cefazolin, for 6 weeks, End Date: 12/02/22 pain management per orders -- palliative care following patient cannot tolerate any position except laying nearly flat in the bed     Confusion and agitation Hospital delirium Pt was agitated, and also not responding to questions or commands during early hospitalization.  Mental status did start to improve, and now pt is calm and interactive, however, at this time she has been having waxing and waning mental status Delirium precautions cont seroquel 50 mg nightly to help with delirium and sleep.     ESRD: on HD MWF.  Pt refused HD on 10/17/22, 10/18/22 and 3/27, 4/1.   Since then, has been agreeable to dialysis without further issues, getting dialysis in hospital bed. When attempts made to do HD in chair, she did not tolerate on account of severe back pain Recommend consideration for home hemodialysis if no facility capable to  do HD in the bed   Generalized weakness / Physical Debility:  PT recs SNF. TOC following. Pt is bedridden and very high risk for developing pressure injuries of skin -- reposition frequently.   Monitor pressures areas on skin and offload as possible.   Severe back and bilateral leg pain due to severe multi-level lumbar degenerative  disc disease. Pt has bulging discs at L2-3, L3-4, L4-5 and L5-S1. Pt will continue to have back and leg pain once antibiotics are complete. Pain mgmt per orders  Pain control complicated by AMS and hypotension with escalation - titrating up on fentanyl patch, cautious use of IV fentanyl for breakthrough pain    ACD:  likely secondary ESRD. H&H are stable  Monitor periodic CBC   Hypokalemia Replace as needed Monitor BMP   Peripheral neuropathy --  Continue low dose gabapentin and monitor   Hx of seizures:  continue on home dose of Keppra   HLD:  continue on statin    Hx of COPD: w/o exacerbation.  Continue on bronchodilators    DM2: well controlled HbA1c 5.2.  d/c'ed BG checks    HTN:  Antihypertensives as blood pressure allows   GERD:  continue on PPI    Depression: severity unknown.  Continue on home dose of sertraline     Overweight: BMI 28.2.  Wt loss efforts recommended   Right foot pain  see peripheral neuropathy   DVT prophylaxis: heparin sq Pertinent IV fluids/nutrition: no continuous IV fluids  Central lines / invasive devices: L IJ HD cath, port R chest   Code Status: DNR ACP documentation reviewed: 05/01 - MOST and advanced directive reviewed, [name] is HCPOA  Current Admission Status: inpatient  TOC needs / Dispo plan: SNF Barriers to discharge / significant pending items: unable to sit to toelrate outpatient HD, working w/ PT              Subjective / Brief ROS:  Patient reports no concerns  States pain is about the same  Denies CP/SOB.      Family Communication: none at this time    Objective Findings:  Vitals:   11/26/22 1617 11/26/22 2101 11/27/22 0449 11/27/22 0900  BP: (!) 109/31 114/60 120/61 117/62  Pulse: 88 86 84 89  Resp: 16 16 20 18   Temp: 98.4 F (36.9 C) 98.1 F (36.7 C) 97.9 F (36.6 C) 98 F (36.7 C)  TempSrc: Oral  Oral Oral  SpO2: 100% 93% 100%   Weight:      Height:       No intake  or output data in the 24 hours ending 11/27/22 1212  Filed Weights   11/23/22 1157 11/25/22 1215 11/25/22 1600  Weight: 88.8 kg 87.4 kg 87 kg    Examination:  Physical Exam Constitutional:      General: She is not in acute distress.    Appearance: She is obese.  Cardiovascular:     Rate and Rhythm: Normal rate and regular rhythm.  Pulmonary:     Effort: Pulmonary effort is normal. No respiratory distress.     Breath sounds: Normal breath sounds.  Neurological:     General: No focal deficit present.     Mental Status: She is alert. Mental status is at baseline.          Scheduled Medications:   (feeding supplement) PROSource Plus  30 mL Oral BID BM   Chlorhexidine Gluconate Cloth  6 each Topical Q0600   cinacalcet  30 mg Oral  Q supper   epoetin (EPOGEN/PROCRIT) injection  4,000 Units Intravenous Q M,W,F-HD   feeding supplement (NEPRO CARB STEADY)  237 mL Oral TID BM   fentaNYL  1 patch Transdermal Q72H   gabapentin  100 mg Oral Once per day on Mon Wed Fri   Gerhardt's butt cream   Topical QID   levETIRAcetam  1,000 mg Oral Daily   levETIRAcetam  250 mg Oral Q M,W,F   metoprolol succinate  100 mg Oral Daily   pantoprazole  20 mg Oral BID   psyllium  1 packet Oral Daily   QUEtiapine  25 mg Oral QHS   sertraline  25 mg Oral Daily   sodium chloride flush  10-40 mL Intracatheter Q12H   umeclidinium-vilanterol  1 puff Inhalation Daily    Continuous Infusions:  sodium chloride Stopped (11/16/22 1155)   anticoagulant sodium citrate      ceFAZolin (ANCEF) IV Stopped (11/21/22 1200)    ceFAZolin (ANCEF) IV Stopped (11/23/22 1121)    ceFAZolin (ANCEF) IV Stopped (11/25/22 1534)    PRN Medications:  alteplase, anticoagulant sodium citrate, fentaNYL (SUBLIMAZE) injection, hydrALAZINE, lidocaine (PF), lidocaine-prilocaine, LORazepam, ondansetron, phenol, senna-docusate, sodium chloride flush  Antimicrobials from admission:  Anti-infectives (From admission, onward)     Start     Dose/Rate Route Frequency Ordered Stop   11/18/22 1200  ceFAZolin (ANCEF) IVPB 3g/100 mL premix        3 g 200 mL/hr over 30 Minutes Intravenous Every Fri (Hemodialysis) 11/13/22 1039     11/17/22 0000  ceFAZolin (ANCEF) IVPB 1 g/50 mL premix       Note to Pharmacy: Send with pt to OR   1 g 100 mL/hr over 30 Minutes Intravenous On call 11/16/22 1154 11/16/22 1500   11/16/22 1200  ceFAZolin (ANCEF) IVPB 2g/100 mL premix        2 g 200 mL/hr over 30 Minutes Intravenous Every Wed (Hemodialysis) 11/13/22 1039     11/14/22 1200  ceFAZolin (ANCEF) IVPB 2g/100 mL premix        2 g 200 mL/hr over 30 Minutes Intravenous Every Mon (Hemodialysis) 11/13/22 1039     11/11/22 2200  ceFAZolin (ANCEF) IVPB 1 g/50 mL premix        1 g 100 mL/hr over 30 Minutes Intravenous Daily at 10 pm 11/10/22 1655 11/13/22 2320   11/09/22 1200  ceFAZolin (ANCEF) IVPB 2g/100 mL premix  Status:  Discontinued        2 g 200 mL/hr over 30 Minutes Intravenous Every Wed (Hemodialysis) 11/02/22 1246 11/03/22 1111   11/07/22 1700  ceFAZolin (ANCEF) IVPB 2g/100 mL premix        2 g 200 mL/hr over 30 Minutes Intravenous  Once 11/07/22 1610 11/07/22 1827   11/07/22 1200  ceFAZolin (ANCEF) IVPB 2g/100 mL premix  Status:  Discontinued        2 g 200 mL/hr over 30 Minutes Intravenous Every Mon (Hemodialysis) 11/02/22 1246 11/10/22 1655   11/04/22 1200  ceFAZolin (ANCEF) IVPB 3g/100 mL premix  Status:  Discontinued        3 g 200 mL/hr over 30 Minutes Intravenous Every Fri (Hemodialysis) 11/02/22 1246 11/10/22 1655   11/02/22 2200  ceFAZolin (ANCEF) IVPB 1 g/50 mL premix  Status:  Discontinued        1 g 100 mL/hr over 30 Minutes Intravenous Daily at bedtime 11/01/22 1452 11/02/22 1246   11/02/22 1330  ceFAZolin (ANCEF) IVPB 2g/100 mL premix  Status:  Discontinued  2 g 200 mL/hr over 30 Minutes Intravenous Every Wed (Hemodialysis) 11/02/22 1320 11/10/22 1655   11/02/22 1200  ceFAZolin (ANCEF) IVPB 2g/100 mL  premix  Status:  Discontinued        2 g 200 mL/hr over 30 Minutes Intravenous Every Wed (Hemodialysis) 10/28/22 1004 11/01/22 1452   10/31/22 1200  ceFAZolin (ANCEF) IVPB 2g/100 mL premix  Status:  Discontinued        2 g 200 mL/hr over 30 Minutes Intravenous Every Mon (Hemodialysis) 10/28/22 1004 11/01/22 1452   10/28/22 1200  ceFAZolin (ANCEF) IVPB 3g/100 mL premix  Status:  Discontinued        3 g 200 mL/hr over 30 Minutes Intravenous Every Fri (Hemodialysis) 10/28/22 1004 11/01/22 1452   10/19/22 2200  ceFAZolin (ANCEF) IVPB 1 g/50 mL premix  Status:  Discontinued        1 g 100 mL/hr over 30 Minutes Intravenous Every 24 hours 10/18/22 0834 10/28/22 1004   10/18/22 1000  ceFAZolin (ANCEF) IVPB 2g/100 mL premix        2 g 200 mL/hr over 30 Minutes Intravenous  Once 10/18/22 0834 10/18/22 0946   10/17/22 1400  ceFEPIme (MAXIPIME) 1 g in sodium chloride 0.9 % 100 mL IVPB  Status:  Discontinued        1 g 200 mL/hr over 30 Minutes Intravenous Every 24 hours 10/17/22 1252 10/17/22 1520   10/14/22 1200  vancomycin (VANCOCIN) IVPB 1000 mg/200 mL premix  Status:  Discontinued        1,000 mg 200 mL/hr over 60 Minutes Intravenous Every M-W-F (Hemodialysis) 10/13/22 1356 10/19/22 0809   10/13/22 1400  ceFEPIme (MAXIPIME) 1 g in sodium chloride 0.9 % 100 mL IVPB  Status:  Discontinued        1 g 200 mL/hr over 30 Minutes Intravenous Every 24 hours 10/13/22 1140 10/17/22 1223   10/13/22 1230  vancomycin (VANCOREADY) IVPB 2000 mg/400 mL        2,000 mg 200 mL/hr over 120 Minutes Intravenous  Once 10/13/22 1140 10/13/22 1437   10/12/22 1645  vancomycin (VANCOREADY) IVPB 1500 mg/300 mL  Status:  Discontinued       See Hyperspace for full Linked Orders Report.   1,500 mg 150 mL/hr over 120 Minutes Intravenous  Once 10/12/22 1110 10/12/22 1753   10/12/22 1600  ceFEPIme (MAXIPIME) 1 g in sodium chloride 0.9 % 100 mL IVPB  Status:  Discontinued        1 g 200 mL/hr over 30 Minutes Intravenous  Every 24 hours 10/12/22 1112 10/12/22 1753   10/12/22 1500  vancomycin (VANCOCIN) IVPB 1000 mg/200 mL premix  Status:  Discontinued       See Hyperspace for full Linked Orders Report.   1,000 mg 200 mL/hr over 60 Minutes Intravenous  Once 10/12/22 1110 10/12/22 1753   10/12/22 1115  ceFEPIme (MAXIPIME) 1 g in sodium chloride 0.9 % 100 mL IVPB  Status:  Discontinued        1 g 200 mL/hr over 30 Minutes Intravenous  Once 10/12/22 1110 10/12/22 1112           Data Reviewed:  I have personally reviewed the following...  CBC: Recent Labs  Lab 11/21/22 0530 11/22/22 0610 11/23/22 0446 11/25/22 1224  WBC 7.7 5.0 7.0 7.4  NEUTROABS 4.6 2.6 3.1  --   HGB 8.9* 6.4* 9.3* 9.6*  HCT 29.5* 21.7* 31.4* 32.2*  MCV 82.4 84.4 82.4 84.5  PLT 168 127* 189 230  Basic Metabolic Panel: Recent Labs  Lab 11/21/22 0530 11/22/22 0610 11/23/22 0446 11/25/22 1224  NA 137 133* 133* 136  K 4.0 3.7 4.5 3.8  CL 99 97* 97* 98  CO2 26 24 25 27   GLUCOSE 141* 101* 133* 145*  BUN 45* 25* 37* 34*  CREATININE 7.58* 5.03* 6.63* 6.75*  CALCIUM 10.2 9.5 10.0 10.1  PHOS  --   --   --  5.7*    GFR: Estimated Creatinine Clearance: 8.8 mL/min (A) (by C-G formula based on SCr of 6.75 mg/dL (H)). Liver Function Tests: Recent Labs  Lab 11/25/22 1224  ALBUMIN 2.5*    No results for input(s): "LIPASE", "AMYLASE" in the last 168 hours. No results for input(s): "AMMONIA" in the last 168 hours. Coagulation Profile: No results for input(s): "INR", "PROTIME" in the last 168 hours. Cardiac Enzymes: No results for input(s): "CKTOTAL", "CKMB", "CKMBINDEX", "TROPONINI" in the last 168 hours. BNP (last 3 results) No results for input(s): "PROBNP" in the last 8760 hours. HbA1C: No results for input(s): "HGBA1C" in the last 72 hours. CBG: No results for input(s): "GLUCAP" in the last 168 hours.  Lipid Profile: No results for input(s): "CHOL", "HDL", "LDLCALC", "TRIG", "CHOLHDL", "LDLDIRECT" in the last  72 hours. Thyroid Function Tests: No results for input(s): "TSH", "T4TOTAL", "FREET4", "T3FREE", "THYROIDAB" in the last 72 hours. Anemia Panel: No results for input(s): "VITAMINB12", "FOLATE", "FERRITIN", "TIBC", "IRON", "RETICCTPCT" in the last 72 hours. Most Recent Urinalysis On File:     Component Value Date/Time   COLORURINE YELLOW (A) 03/20/2022 0125   APPEARANCEUR CLEAR (A) 03/20/2022 0125   APPEARANCEUR Clear 06/14/2014 1510   LABSPEC 1.010 03/20/2022 0125   LABSPEC 1.006 06/14/2014 1510   PHURINE 7.0 03/20/2022 0125   GLUCOSEU 50 (A) 03/20/2022 0125   GLUCOSEU >=500 06/14/2014 1510   HGBUR MODERATE (A) 03/20/2022 0125   BILIRUBINUR NEGATIVE 03/20/2022 0125   BILIRUBINUR Negative 06/14/2014 1510   KETONESUR NEGATIVE 03/20/2022 0125   PROTEINUR 100 (A) 03/20/2022 0125   NITRITE NEGATIVE 03/20/2022 0125   LEUKOCYTESUR NEGATIVE 03/20/2022 0125   LEUKOCYTESUR Negative 06/14/2014 1510   Sepsis Labs: @LABRCNTIP (procalcitonin:4,lacticidven:4) Microbiology: No results found for this or any previous visit (from the past 240 hour(s)).    Radiology Studies last 3 days: No results found.           LOS: 46 days      Sunnie Nielsen, DO [facility] 11/27/2022, 12:12 PM    Dictation software may have been used to generate the above note. Typos may occur and escape review in typed/dictated notes. Please contact Dr Lyn Hollingshead directly for clarity if needed.  Staff may message me via secure chat in Epic  but this may not receive an immediate response,  please page me for urgent matters!  If 7PM-7AM, please contact night coverage [email or web address]

## 2022-11-28 DIAGNOSIS — M79604 Pain in right leg: Secondary | ICD-10-CM | POA: Diagnosis not present

## 2022-11-28 DIAGNOSIS — M5442 Lumbago with sciatica, left side: Secondary | ICD-10-CM | POA: Diagnosis not present

## 2022-11-28 DIAGNOSIS — E1142 Type 2 diabetes mellitus with diabetic polyneuropathy: Secondary | ICD-10-CM | POA: Diagnosis not present

## 2022-11-28 DIAGNOSIS — D649 Anemia, unspecified: Secondary | ICD-10-CM | POA: Diagnosis not present

## 2022-11-28 LAB — CBC
HCT: 31 % — ABNORMAL LOW (ref 36.0–46.0)
Hemoglobin: 9 g/dL — ABNORMAL LOW (ref 12.0–15.0)
MCH: 24.5 pg — ABNORMAL LOW (ref 26.0–34.0)
MCHC: 29 g/dL — ABNORMAL LOW (ref 30.0–36.0)
MCV: 84.5 fL (ref 80.0–100.0)
Platelets: 222 10*3/uL (ref 150–400)
RBC: 3.67 MIL/uL — ABNORMAL LOW (ref 3.87–5.11)
RDW: 19.8 % — ABNORMAL HIGH (ref 11.5–15.5)
WBC: 7.7 10*3/uL (ref 4.0–10.5)
nRBC: 0 % (ref 0.0–0.2)

## 2022-11-28 LAB — RENAL FUNCTION PANEL
Albumin: 2.6 g/dL — ABNORMAL LOW (ref 3.5–5.0)
Anion gap: 14 (ref 5–15)
BUN: 41 mg/dL — ABNORMAL HIGH (ref 8–23)
CO2: 26 mmol/L (ref 22–32)
Calcium: 10.5 mg/dL — ABNORMAL HIGH (ref 8.9–10.3)
Chloride: 98 mmol/L (ref 98–111)
Creatinine, Ser: 7.67 mg/dL — ABNORMAL HIGH (ref 0.44–1.00)
GFR, Estimated: 5 mL/min — ABNORMAL LOW (ref >60)
Glucose, Bld: 122 mg/dL — ABNORMAL HIGH (ref 70–99)
Phosphorus: 5.7 mg/dL — ABNORMAL HIGH (ref 2.5–4.6)
Potassium: 3.7 mmol/L (ref 3.5–5.1)
Sodium: 138 mmol/L (ref 135–145)

## 2022-11-28 MED ORDER — HEPARIN SODIUM (PORCINE) 1000 UNIT/ML IJ SOLN
INTRAMUSCULAR | Status: AC
  Filled 2022-11-28: qty 10

## 2022-11-28 MED ORDER — METOPROLOL SUCCINATE ER 50 MG PO TB24
50.0000 mg | ORAL_TABLET | Freq: Every day | ORAL | Status: DC
  Administered 2022-11-29 – 2022-12-06 (×6): 50 mg via ORAL
  Filled 2022-11-28 (×6): qty 1

## 2022-11-28 MED ORDER — EPOETIN ALFA 4000 UNIT/ML IJ SOLN
INTRAMUSCULAR | Status: AC
  Filled 2022-11-28: qty 1

## 2022-11-28 NOTE — Progress Notes (Signed)
PROGRESS NOTE    Cynthia Dean   ZOX:[id number] DOB: 10/22/1950  DOA: 10/12/2022 Date of Service: 11/28/22 PCP: Loistine Chance, MD     Brief Narrative / Hospital Course:  Cynthia Dean is a 72 y.o. 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 She presents to the ED with worsening lower extremity pain. Admitted for possible Discitis/osteomyelitis at L4-L5: w/ suspected intradiscal abscess as per MRI. 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   4/18, HD access / fistula clotted off during dialysis. 4/19 pt taken for procedure to address fistula malfunction, but was agitated and combative, resfused procedure. 4/19 vascular placed R femoral temp HD cath placed, resumed HD and pemcath placed 4/24  Patient has had waxing and waning mental status with intermittently refusing dialysis. Patient's family have decided to continue hemodialysis after speaking with their PCP. Hospital course prolonged, complicated by patient's inability to tolerate sitting in recliner chair for dialysis.  She only agrees to dialysis in the bed.  This is a barrier to discharge as no outpatient dialysis centers can accommodate.      5/3: titrating up on fentanyl patch 12.5 --> 25 mcg    Consultants:  Neurosurgery Infectious Disease Vascular surgery  Nephrology   Procedures: 09/22/22: Stent placement to the AV graft  10/13/22: CT aspiration of right psoas muscle abscess yielding 2 ml purulent material  11/11/22: R Femoral V catheter placement 11/16/22: Permcath placement to L IJ       ASSESSMENT & PLAN:   Principal Problem:   Back pain Active Problems:   Anemia   Bilateral leg pain   ESRD on dialysis [facility])   Essential hypertension   Dyslipidemia   Seizure disorder (HCC)   Diabetic retinopathy  without macular edema associated with type 2 diabetes mellitus (HCC)   GERD (gastroesophageal reflux disease)   Diabetic neuropathy (HCC)   Anemia of chronic disease   Insulin-requiring or dependent type II diabetes mellitus (HCC)   Discitis of lumbar region   Myositis   Psoas abscess (HCC)   Lumbar discitis, 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   Wound cx growing staph capitis.   ID consulted.  Abx de-escalated to cefazolin.   Not a good surgical candidate as per neuro surg.   Pt having severe low back and bilateral leg pain cont cefazolin, for 6 weeks, End Date: 12/02/22 pain management per orders -- palliative care following 5/3: titrating up on fentanyl patch 12.5 --> 25 mcg - pt reports pain is improved today, will trial up in chair today/tomorrow  patient cannot tolerate any position except laying nearly flat in the bed     Confusion and agitation Hospital delirium Pt was agitated, and also not responding to questions or commands during early hospitalization.  Mental status did start to improve, and now pt is calm and interactive, however, at this time she has been having waxing and waning mental status Delirium precautions cont seroquel 50 mg nightly to help with delirium and sleep.     ESRD: on HD MWF.  Pt refused HD on 10/17/22, 10/18/22 and 3/27, 4/1.   Since then, has been agreeable to dialysis without further issues, getting dialysis in hospital bed. When attempts made to  do HD in chair, she did not tolerate on account of severe back pain Recommend consideration for home hemodialysis if no facility capable to do HD in the bed   Generalized weakness / Physical Debility:  PT recs SNF. TOC following. Pt is bedridden and very high risk for developing pressure injuries of skin -- reposition frequently.   Monitor  pressures areas on skin and offload as possible.   Severe back and bilateral leg pain due to severe multi-level lumbar degenerative disc disease. Pt has bulging discs at L2-3, L3-4, L4-5 and L5-S1. Pt will continue to have back and leg pain once antibiotics are complete. Pain mgmt per orders  Pain control complicated by AMS and hypotension with escalation - titrating up on fentanyl patch, cautious use of IV fentanyl for breakthrough pain    ACD:  likely secondary ESRD. H&H are stable  Monitor periodic CBC   Hypokalemia Replace as needed Monitor BMP   Peripheral neuropathy --  Continue low dose gabapentin and monitor   Hx of seizures:  continue on home dose of Keppra   HLD:  continue on statin    Hx of COPD: w/o exacerbation.  Continue on bronchodilators    DM2: well controlled HbA1c 5.2.  d/c'ed BG checks    HTN:  Antihypertensives as blood pressure allows   GERD:  continue on PPI    Depression: severity unknown.  Continue on home dose of sertraline     Overweight: BMI 28.2.  Wt loss efforts recommended   Right foot pain  see peripheral neuropathy   DVT prophylaxis: heparin sq Pertinent IV fluids/nutrition: no continuous IV fluids  Central lines / invasive devices: L IJ HD cath, port R chest   Code Status: DNR ACP documentation reviewed: 05/01 - MOST and advanced directive reviewed, [name] is HCPOA  Current Admission Status: inpatient  TOC needs / Dispo plan: SNF Barriers to discharge / significant pending items: unable to sit to toelrate outpatient HD, working w/ PT              Subjective / Brief ROS:  Patient reports no concerns  States pain is a bit better today  Denies CP/SOB.      Family Communication: none at this time    Objective Findings:  Vitals:   11/28/22 1139 11/28/22 1146 11/28/22 1147 11/28/22 1212  BP: (!) 99/26  (!) 103/25 (!) 105/23  Pulse: 78   82  Resp: 18   17  Temp: 98.2 F (36.8 C)   98.6 F (37  C)  TempSrc: Oral     SpO2: 95%   95%  Weight:  89 kg    Height:        Intake/Output Summary (Last 24 hours) at 11/28/2022 1247 Last data filed at 11/28/2022 1139 Gross per 24 hour  Intake --  Output 0 ml  Net 0 ml   Filed Weights   11/25/22 1600 11/28/22 0758 11/28/22 1146  Weight: 87 kg 88.2 kg 89 kg    Examination:  Physical Exam Constitutional:      General: She is not in acute distress.    Appearance: She is obese.  Cardiovascular:     Rate and Rhythm: Normal rate and regular rhythm.  Pulmonary:     Effort: Pulmonary effort is normal. No respiratory distress.     Breath sounds: Normal breath sounds.  Neurological:     General: No focal deficit present.     Mental Status: She is alert. Mental status is at  baseline.          Scheduled Medications:   (feeding supplement) PROSource Plus  30 mL Oral BID BM   Chlorhexidine Gluconate Cloth  6 each Topical Q0600   cinacalcet  30 mg Oral Q supper   epoetin (EPOGEN/PROCRIT) injection  4,000 Units Intravenous Q M,W,F-HD   feeding supplement (NEPRO CARB STEADY)  237 mL Oral TID BM   fentaNYL  1 patch Transdermal Q72H   gabapentin  100 mg Oral Once per day on Mon Wed Fri   Gerhardt's butt cream   Topical QID   levETIRAcetam  1,000 mg Oral Daily   levETIRAcetam  250 mg Oral Q M,W,F   metoprolol succinate  100 mg Oral Daily   pantoprazole  20 mg Oral BID   psyllium  1 packet Oral Daily   QUEtiapine  25 mg Oral QHS   sertraline  25 mg Oral Daily   sodium chloride flush  10-40 mL Intracatheter Q12H   umeclidinium-vilanterol  1 puff Inhalation Daily    Continuous Infusions:  sodium chloride Stopped (11/16/22 1155)   anticoagulant sodium citrate      ceFAZolin (ANCEF) IV Stopped (11/23/22 1121)    ceFAZolin (ANCEF) IV Stopped (11/25/22 1534)    PRN Medications:  alteplase, anticoagulant sodium citrate, fentaNYL (SUBLIMAZE) injection, guaiFENesin, hydrALAZINE, lidocaine (PF), lidocaine-prilocaine, LORazepam,  ondansetron, phenol, senna-docusate, sodium chloride flush  Antimicrobials from admission:  Anti-infectives (From admission, onward)    Start     Dose/Rate Route Frequency Ordered Stop   11/18/22 1200  ceFAZolin (ANCEF) IVPB 3g/100 mL premix        3 g 200 mL/hr over 30 Minutes Intravenous Every Fri (Hemodialysis) 11/13/22 1039 12/02/22 2359   11/17/22 0000  ceFAZolin (ANCEF) IVPB 1 g/50 mL premix       Note to Pharmacy: Send with pt to OR   1 g 100 mL/hr over 30 Minutes Intravenous On call 11/16/22 1154 11/16/22 1500   11/16/22 1200  ceFAZolin (ANCEF) IVPB 2g/100 mL premix        2 g 200 mL/hr over 30 Minutes Intravenous Every Wed (Hemodialysis) 11/13/22 1039 12/02/22 2359   11/14/22 1200  ceFAZolin (ANCEF) IVPB 2g/100 mL premix        2 g 200 mL/hr over 30 Minutes Intravenous Every Mon (Hemodialysis) 11/13/22 1039 11/28/22 1151   11/11/22 2200  ceFAZolin (ANCEF) IVPB 1 g/50 mL premix        1 g 100 mL/hr over 30 Minutes Intravenous Daily at 10 pm 11/10/22 1655 11/13/22 2320   11/09/22 1200  ceFAZolin (ANCEF) IVPB 2g/100 mL premix  Status:  Discontinued        2 g 200 mL/hr over 30 Minutes Intravenous Every Wed (Hemodialysis) 11/02/22 1246 11/03/22 1111   11/07/22 1700  ceFAZolin (ANCEF) IVPB 2g/100 mL premix        2 g 200 mL/hr over 30 Minutes Intravenous  Once 11/07/22 1610 11/07/22 1827   11/07/22 1200  ceFAZolin (ANCEF) IVPB 2g/100 mL premix  Status:  Discontinued        2 g 200 mL/hr over 30 Minutes Intravenous Every Mon (Hemodialysis) 11/02/22 1246 11/10/22 1655   11/04/22 1200  ceFAZolin (ANCEF) IVPB 3g/100 mL premix  Status:  Discontinued        3 g 200 mL/hr over 30 Minutes Intravenous Every Fri (Hemodialysis) 11/02/22 1246 11/10/22 1655   11/02/22 2200  ceFAZolin (ANCEF) IVPB 1 g/50 mL premix  Status:  Discontinued  1 g 100 mL/hr over 30 Minutes Intravenous Daily at bedtime 11/01/22 1452 11/02/22 1246   11/02/22 1330  ceFAZolin (ANCEF) IVPB 2g/100 mL premix   Status:  Discontinued        2 g 200 mL/hr over 30 Minutes Intravenous Every Wed (Hemodialysis) 11/02/22 1320 11/10/22 1655   11/02/22 1200  ceFAZolin (ANCEF) IVPB 2g/100 mL premix  Status:  Discontinued        2 g 200 mL/hr over 30 Minutes Intravenous Every Wed (Hemodialysis) 10/28/22 1004 11/01/22 1452   10/31/22 1200  ceFAZolin (ANCEF) IVPB 2g/100 mL premix  Status:  Discontinued        2 g 200 mL/hr over 30 Minutes Intravenous Every Mon (Hemodialysis) 10/28/22 1004 11/01/22 1452   10/28/22 1200  ceFAZolin (ANCEF) IVPB 3g/100 mL premix  Status:  Discontinued        3 g 200 mL/hr over 30 Minutes Intravenous Every Fri (Hemodialysis) 10/28/22 1004 11/01/22 1452   10/19/22 2200  ceFAZolin (ANCEF) IVPB 1 g/50 mL premix  Status:  Discontinued        1 g 100 mL/hr over 30 Minutes Intravenous Every 24 hours 10/18/22 0834 10/28/22 1004   10/18/22 1000  ceFAZolin (ANCEF) IVPB 2g/100 mL premix        2 g 200 mL/hr over 30 Minutes Intravenous  Once 10/18/22 0834 10/18/22 0946   10/17/22 1400  ceFEPIme (MAXIPIME) 1 g in sodium chloride 0.9 % 100 mL IVPB  Status:  Discontinued        1 g 200 mL/hr over 30 Minutes Intravenous Every 24 hours 10/17/22 1252 10/17/22 1520   10/14/22 1200  vancomycin (VANCOCIN) IVPB 1000 mg/200 mL premix  Status:  Discontinued        1,000 mg 200 mL/hr over 60 Minutes Intravenous Every M-W-F (Hemodialysis) 10/13/22 1356 10/19/22 0809   10/13/22 1400  ceFEPIme (MAXIPIME) 1 g in sodium chloride 0.9 % 100 mL IVPB  Status:  Discontinued        1 g 200 mL/hr over 30 Minutes Intravenous Every 24 hours 10/13/22 1140 10/17/22 1223   10/13/22 1230  vancomycin (VANCOREADY) IVPB 2000 mg/400 mL        2,000 mg 200 mL/hr over 120 Minutes Intravenous  Once 10/13/22 1140 10/13/22 1437   10/12/22 1645  vancomycin (VANCOREADY) IVPB 1500 mg/300 mL  Status:  Discontinued       See Hyperspace for full Linked Orders Report.   1,500 mg 150 mL/hr over 120 Minutes Intravenous  Once 10/12/22  1110 10/12/22 1753   10/12/22 1600  ceFEPIme (MAXIPIME) 1 g in sodium chloride 0.9 % 100 mL IVPB  Status:  Discontinued        1 g 200 mL/hr over 30 Minutes Intravenous Every 24 hours 10/12/22 1112 10/12/22 1753   10/12/22 1500  vancomycin (VANCOCIN) IVPB 1000 mg/200 mL premix  Status:  Discontinued       See Hyperspace for full Linked Orders Report.   1,000 mg 200 mL/hr over 60 Minutes Intravenous  Once 10/12/22 1110 10/12/22 1753   10/12/22 1115  ceFEPIme (MAXIPIME) 1 g in sodium chloride 0.9 % 100 mL IVPB  Status:  Discontinued        1 g 200 mL/hr over 30 Minutes Intravenous  Once 10/12/22 1110 10/12/22 1112           Data Reviewed:  I have personally reviewed the following...  CBC: Recent Labs  Lab 11/22/22 0610 11/23/22 0446 11/25/22 1224 11/28/22 0759  WBC  5.0 7.0 7.4 7.7  NEUTROABS 2.6 3.1  --   --   HGB 6.4* 9.3* 9.6* 9.0*  HCT 21.7* 31.4* 32.2* 31.0*  MCV 84.4 82.4 84.5 84.5  PLT 127* 189 230 222   Basic Metabolic Panel: Recent Labs  Lab 11/22/22 0610 11/23/22 0446 11/25/22 1224 11/28/22 0759  NA 133* 133* 136 138  K 3.7 4.5 3.8 3.7  CL 97* 97* 98 98  CO2 24 25 27 26   GLUCOSE 101* 133* 145* 122*  BUN 25* 37* 34* 41*  CREATININE 5.03* 6.63* 6.75* 7.67*  CALCIUM 9.5 10.0 10.1 10.5*  PHOS  --   --  5.7* 5.7*   GFR: Estimated Creatinine Clearance: 8.4 mL/min (A) (by C-G formula based on SCr of 7.67 mg/dL (H)). Liver Function Tests: Recent Labs  Lab 11/25/22 1224 11/28/22 0759  ALBUMIN 2.5* 2.6*   No results for input(s): "LIPASE", "AMYLASE" in the last 168 hours. No results for input(s): "AMMONIA" in the last 168 hours. Coagulation Profile: No results for input(s): "INR", "PROTIME" in the last 168 hours. Cardiac Enzymes: No results for input(s): "CKTOTAL", "CKMB", "CKMBINDEX", "TROPONINI" in the last 168 hours. BNP (last 3 results) No results for input(s): "PROBNP" in the last 8760 hours. HbA1C: No results for input(s): "HGBA1C" in the last  72 hours. CBG: No results for input(s): "GLUCAP" in the last 168 hours.  Lipid Profile: No results for input(s): "CHOL", "HDL", "LDLCALC", "TRIG", "CHOLHDL", "LDLDIRECT" in the last 72 hours. Thyroid Function Tests: No results for input(s): "TSH", "T4TOTAL", "FREET4", "T3FREE", "THYROIDAB" in the last 72 hours. Anemia Panel: No results for input(s): "VITAMINB12", "FOLATE", "FERRITIN", "TIBC", "IRON", "RETICCTPCT" in the last 72 hours. Most Recent Urinalysis On File:     Component Value Date/Time   COLORURINE YELLOW (A) 03/20/2022 0125   APPEARANCEUR CLEAR (A) 03/20/2022 0125   APPEARANCEUR Clear 06/14/2014 1510   LABSPEC 1.010 03/20/2022 0125   LABSPEC 1.006 06/14/2014 1510   PHURINE 7.0 03/20/2022 0125   GLUCOSEU 50 (A) 03/20/2022 0125   GLUCOSEU >=500 06/14/2014 1510   HGBUR MODERATE (A) 03/20/2022 0125   BILIRUBINUR NEGATIVE 03/20/2022 0125   BILIRUBINUR Negative 06/14/2014 1510   KETONESUR NEGATIVE 03/20/2022 0125   PROTEINUR 100 (A) 03/20/2022 0125   NITRITE NEGATIVE 03/20/2022 0125   LEUKOCYTESUR NEGATIVE 03/20/2022 0125   LEUKOCYTESUR Negative 06/14/2014 1510   Sepsis Labs: @LABRCNTIP (procalcitonin:4,lacticidven:4) Microbiology: No results found for this or any previous visit (from the past 240 hour(s)).    Radiology Studies last 3 days: No results found.           LOS: 47 days      Sunnie Nielsen, DO [facility] 11/28/2022, 12:47 PM    Dictation software may have been used to generate the above note. Typos may occur and escape review in typed/dictated notes. Please contact Dr Lyn Hollingshead directly for clarity if needed.  Staff may message me via secure chat in Epic  but this may not receive an immediate response,  please page me for urgent matters!  If 7PM-7AM, please contact night coverage [email or web address]

## 2022-11-28 NOTE — Progress Notes (Signed)
[facility]  ROUNDING NOTE   Subjective:   Cynthia Dean is a 72 y.o. 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 She presents to the ED with worsening lower extremity pain. Patient will be admitted for Back pain [M54.9] Bilateral leg pain [M79.604, M79.605]  Patient seen and evaluated during dialysis   HEMODIALYSIS FLOWSHEET:  Blood Flow Rate (mL/min): 350 mL/min Arterial Pressure (mmHg): -170 mmHg Venous Pressure (mmHg): 180 mmHg TMP (mmHg): 1 mmHg Ultrafiltration Rate (mL/min): 0 mL/min Dialysate Flow Rate (mL/min): 300 ml/min Dialysis Fluid Bolus: Normal Saline Bolus Amount (mL): 300 mL (per NP)  HD staff report patient irritable and initially refusing treatment. They were able to speak with patient and convince her to proceed with treatment.    Objective:  Vital signs in last 24 hours:  Temp:  [97.7 F (36.5 C)-98.3 F (36.8 C)] 97.9 F (36.6 C) (05/06 0750) Pulse Rate:  [74-90] 75 (05/06 1130) Resp:  [12-29] 29 (05/06 1130) BP: (82-122)/(30-82) 121/82 (05/06 1130) SpO2:  [91 %-100 %] 99 % (05/06 1130) Weight:  [88.2 kg] 88.2 kg (05/06 0758)  Weight change:  Filed Weights   11/25/22 1215 11/25/22 1600 11/28/22 0758  Weight: 87.4 kg 87 kg 88.2 kg    Intake/Output: No intake/output data recorded.   Intake/Output this shift:  No intake/output data recorded.  Physical Exam: General: NAD  Head: Normocephalic, atraumatic.  Moist oral mucosal membranes  Eyes: Anicteric  Lungs:  Clear to auscultation  Heart: Regular rate and rhythm  Abdomen:  Soft, nontender  Extremities: No peripheral edema.  Neurologic: moving all extremities   Skin: No lesions  Access: Left AVG (no bruit/thrill)), left chest PermCath    Basic Metabolic Panel: Recent Labs  Lab 11/22/22 0610 11/23/22 0446 11/25/22 1224 11/28/22 0759   NA 133* 133* 136 138  K 3.7 4.5 3.8 3.7  CL 97* 97* 98 98  CO2 24 25 27 26   GLUCOSE 101* 133* 145* 122*  BUN 25* 37* 34* 41*  CREATININE 5.03* 6.63* 6.75* 7.67*  CALCIUM 9.5 10.0 10.1 10.5*  PHOS  --   --  5.7* 5.7*     Liver Function Tests: Recent Labs  Lab 11/25/22 1224 11/28/22 0759  ALBUMIN 2.5* 2.6*    No results for input(s): "LIPASE", "AMYLASE" in the last 168 hours. No results for input(s): "AMMONIA" in the last 168 hours.  CBC: Recent Labs  Lab 11/22/22 0610 11/23/22 0446 11/25/22 1224 11/28/22 0759  WBC 5.0 7.0 7.4 7.7  NEUTROABS 2.6 3.1  --   --   HGB 6.4* 9.3* 9.6* 9.0*  HCT 21.7* 31.4* 32.2* 31.0*  MCV 84.4 82.4 84.5 84.5  PLT 127* 189 230 222     Cardiac Enzymes: No results for input(s): "CKTOTAL", "CKMB", "CKMBINDEX", "TROPONINI" in the last 168 hours.  BNP: Invalid input(s): "POCBNP"  CBG: No results for input(s): "GLUCAP" in the last 168 hours.   Microbiology: Results for orders placed or performed during the hospital encounter of 10/12/22  Blood culture (routine x 2)     Status: None   Collection Time: 10/12/22  5:40 PM   Specimen: BLOOD  Result Value Ref Range Status   Specimen Description BLOOD BLOOD RIGHT HAND [facility]  Final   Special Requests   Final    BOTTLES DRAWN AEROBIC AND ANAEROBIC Blood Culture results may not be optimal due to an inadequate volume of blood received in  culture bottles   Culture   Final    NO GROWTH 5 DAYS Performed at [facility], [address] [place], [place] [address]    Report Status 10/17/2022 FINAL  Final  Blood culture (routine x 2)     Status: None   Collection Time: 10/12/22  6:56 PM   Specimen: BLOOD  Result Value Ref Range Status   Specimen Description BLOOD BLOOD RIGHT FOREARM  Final   Special Requests   Final    BOTTLES DRAWN AEROBIC ONLY Blood Culture results may not be optimal due to an inadequate volume of blood received in culture bottles   Culture   Final    NO GROWTH 5  DAYS Performed at [facility], [address], [place], [place] [address]    Report Status 10/17/2022 FINAL  Final  Aerobic/Anaerobic Culture w Gram Stain (surgical/deep wound)     Status: None   Collection Time: 10/13/22 11:44 AM   Specimen: Abscess  Result Value Ref Range Status   Specimen Description   Final    ABSCESS Performed at [facility], [address], [place], [place] [address]    Special Requests   Final    MUSCLE ABSCESS Performed at [facility], [address] [place], [place] [address]    Gram Stain   Final    ABUNDANT WBC PRESENT, PREDOMINANTLY PMN NO ORGANISMS SEEN    Culture   Final    FEW STAPHYLOCOCCUS CAPITIS NO ANAEROBES ISOLATED Performed at [facility], [address] [place], [place] [address]    Report Status 10/18/2022 FINAL  Final   Organism ID, Bacteria STAPHYLOCOCCUS CAPITIS  Final      Susceptibility   Staphylococcus capitis - MIC*    CIPROFLOXACIN <=0.5 SENSITIVE Sensitive     ERYTHROMYCIN >=8 RESISTANT Resistant     GENTAMICIN <=0.5 SENSITIVE Sensitive     OXACILLIN <=0.25 SENSITIVE Sensitive     TETRACYCLINE <=1 SENSITIVE Sensitive     VANCOMYCIN 1 SENSITIVE Sensitive     TRIMETH/SULFA <=10 SENSITIVE Sensitive     CLINDAMYCIN INTERMEDIATE Intermediate     RIFAMPIN <=0.5 SENSITIVE Sensitive     Inducible Clindamycin NEGATIVE Sensitive     * FEW STAPHYLOCOCCUS CAPITIS    Coagulation Studies: No results for input(s): "LABPROT", "INR" in the last 72 hours.  Urinalysis: No results for input(s): "COLORURINE", "LABSPEC", "PHURINE", "GLUCOSEU", "HGBUR", "BILIRUBINUR", "KETONESUR", "PROTEINUR", "UROBILINOGEN", "NITRITE", "LEUKOCYTESUR" in the last 72 hours.  Invalid input(s): "APPERANCEUR"    Imaging: No results found.   Medications:    sodium chloride Stopped (11/16/22 1155)   anticoagulant sodium citrate      ceFAZolin (ANCEF) IV Stopped (11/23/22 1121)    ceFAZolin (ANCEF) IV Stopped  (11/25/22 1534)    (feeding supplement) PROSource Plus  30 mL Oral BID BM   Chlorhexidine Gluconate Cloth  6 each Topical Q0600   cinacalcet  30 mg Oral Q supper   epoetin (EPOGEN/PROCRIT) injection  4,000 Units Intravenous Q M,W,F-HD   feeding supplement (NEPRO CARB STEADY)  237 mL Oral TID BM   fentaNYL  1 patch Transdermal Q72H   gabapentin  100 mg Oral Once per day on Mon Wed Fri   Gerhardt's butt cream   Topical QID   levETIRAcetam  1,000 mg Oral Daily   levETIRAcetam  250 mg Oral Q M,W,F   metoprolol succinate  100 mg Oral Daily   pantoprazole  20 mg Oral BID   psyllium  1 packet Oral  Daily   QUEtiapine  25 mg Oral QHS   sertraline  25 mg Oral Daily   sodium chloride flush  10-40 mL Intracatheter Q12H   umeclidinium-vilanterol  1 puff Inhalation Daily   alteplase, anticoagulant sodium citrate, fentaNYL (SUBLIMAZE) injection, guaiFENesin, hydrALAZINE, lidocaine (PF), lidocaine-prilocaine, LORazepam, ondansetron, phenol, senna-docusate, sodium chloride flush  Assessment/ Plan:  [name] is a 72 y.o.  female with end stage renal disease on hemodialysis, coronary artery disease, COPD with intermittent home O2, hypertension, hyperlipidemia, stroke, diabetes mellitus type II, and breast cancer who was admitted to [facility] on 10/12/2022 for Back pain [M54.9] Bilateral leg pain [M79.604, M79.605]  Patient was found to have vertebral osteomyelitis and psoas abscess. Cultures positive for staph capitis  [facility] MWF Left AVG 102.5kg  End-stage renal disease on hemodialysis. on MWF schedule.     Receiving dialysis today, UF reduced to 0 due to hypotension. Patient received total of NS bolus during treatment with an additional at end of treatment.   Anemia of chronic kidney disease Normocytic Lab Results  Component Value Date   HGB 9.0 (L) 11/28/2022  Patient receives Mircera at outpatient clinic.  Continue low dose EPO with dialysis.    Secondary Hyperparathyroidism: with outpatient labs: PTH 180 (3/11), phosphorus 5.4, calcium 9.3   Lab Results  Component Value Date   PTH 131 (H) 03/14/2018   CALCIUM 10.5 (H) 11/28/2022   CAION 1.09 (L) 08/28/2022   PHOS 5.7 (H) 11/28/2022   -Continue Cinacalcet. -Will continue to monitor bone minerals  Hypotension during hemodialysis treatment.  Blood pressure 103/25 at end of dialysis treatment. Will consider Midodrine or adjustments with metoprolol.   Diabetes mellitus type II with chronic kidney disease/renal manifestations: insulin dependent, NPH.    Vertebral osteomyelitis, seen on CT lumber spine. Neurosurgery feels patient is not a surgical candidate CT guided aspiration of psoas abscess on 10/13/22. Cultures positive for Staph Capitis.   -Infectious disease recommending cefazolin with dialysis, until 12/02/22.    LOS: 47   5/6/202411:40 AM

## 2022-11-28 NOTE — Progress Notes (Signed)
Mobility Specialist - Progress Note   11/28/22 1253  Mobility  Activity Turned to right side;Turned to left side (bed level exercise)  Level of Assistance Minimal assist, patient does 75% or more  Assistive Device None  Range of Motion/Exercises Active Assistive  Activity Response Tolerated well  $Mobility charge 1 Mobility  Mobility Specialist Start Time (ACUTE ONLY) 1225  Mobility Specialist Stop Time (ACUTE ONLY) 1238  Mobility Specialist Time Calculation (min) (ACUTE ONLY) 13 min   Pt in fowler position upon entry, utilizing RA. Pt expressed pain in right knee and back prior to activity. Pt require active assistance to complete all exercise this date. Pt completed 4 BLE leg raises-- requiring more assistance for LLE compared to the RLE. Pt completed 3 Bilat arm curls-- unable to redirect attention to continue session. Pt left in fowler position with alarm set and needs within reach.   Zetta Bills Mobility Specialist 11/28/22 12:59 PM

## 2022-11-28 NOTE — Progress Notes (Signed)
  Received patient in bed to unit.   Informed consent signed and in chart.    TX duration:3.5hrs     Transported back to room  Hand-off given to patient's nurse. Pt with no c/o an no distress noted    Access used: L HD catheter Access issues: no pull from arterial port.  Lines reversed for tx    Total UF removed: added throughout tx Medication(s) given: 4000u epogen, 2g Ancef  Post HD VS: 103/25 Post HD weight: 89.0kg     Lynann Beaver  [facility]

## 2022-11-29 DIAGNOSIS — M79604 Pain in right leg: Secondary | ICD-10-CM | POA: Diagnosis not present

## 2022-11-29 DIAGNOSIS — D649 Anemia, unspecified: Secondary | ICD-10-CM | POA: Diagnosis not present

## 2022-11-29 DIAGNOSIS — M5442 Lumbago with sciatica, left side: Secondary | ICD-10-CM | POA: Diagnosis not present

## 2022-11-29 DIAGNOSIS — E1142 Type 2 diabetes mellitus with diabetic polyneuropathy: Secondary | ICD-10-CM | POA: Diagnosis not present

## 2022-11-29 MED ORDER — HEPARIN SODIUM (PORCINE) 5000 UNIT/ML IJ SOLN
5000.0000 [IU] | Freq: Three times a day (TID) | INTRAMUSCULAR | Status: DC
  Administered 2022-11-29 – 2023-01-12 (×124): 5000 [IU] via SUBCUTANEOUS
  Filled 2022-11-29 (×123): qty 1

## 2022-11-29 NOTE — Progress Notes (Signed)
PROGRESS NOTE    Cynthia Dean   ZOX:[id number] DOB: December 29, 1950  DOA: 10/12/2022 Date of Service: 11/29/22 PCP: Loistine Chance, MD     Brief Narrative / Hospital Course:  Cynthia Dean is a 72 y.o. 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 She presents to the ED with worsening lower extremity pain. Admitted for possible Discitis/osteomyelitis at L4-L5: w/ suspected intradiscal abscess as per MRI. 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   4/18, HD access / fistula clotted off during dialysis. 4/19 pt taken for procedure to address fistula malfunction, but was agitated and combative, resfused procedure. 4/19 vascular placed R femoral temp HD cath placed, resumed HD and pemcath placed 4/24  Patient has had waxing and waning mental status with intermittently refusing dialysis. Patient's family have decided to continue hemodialysis after speaking with their PCP. Hospital course prolonged, complicated by patient's inability to tolerate sitting in recliner chair for dialysis.  She only agrees to dialysis in the bed.  This is a barrier to discharge as no outpatient dialysis centers can accommodate.      5/3: titrating up on fentanyl patch 12.5 --> 25 mcg, seems to be helping, if still unable to sit up in chair may consider increasing dose further     Consultants:  Neurosurgery Infectious Disease Vascular surgery  Nephrology   Procedures: 09/22/22: Stent placement to the AV graft  10/13/22: CT aspiration of right psoas muscle abscess yielding 2 ml purulent material  11/11/22: R Femoral V catheter placement 11/16/22: Permcath placement to L IJ       ASSESSMENT & PLAN:   Principal Problem:   Back pain Active Problems:   Anemia   Bilateral leg pain   ESRD on dialysis [facility])    Essential hypertension   Dyslipidemia   Seizure disorder (HCC)   Diabetic retinopathy without macular edema associated with type 2 diabetes mellitus (HCC)   GERD (gastroesophageal reflux disease)   Diabetic neuropathy (HCC)   Anemia of chronic disease   Insulin-requiring or dependent type II diabetes mellitus (HCC)   Discitis of lumbar region   Myositis   Psoas abscess (HCC)   Lumbar discitis, 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   Wound cx growing staph capitis.   ID consulted.  Abx de-escalated to cefazolin.   Not a good surgical candidate as per neuro surg.   Pt having severe low back and bilateral leg pain and has not been able to tolerate any position except laying nearly flat in the bed cont cefazolin, for 6 weeks, End Date: 12/02/22 pain management per orders -- palliative care following peripherally 5/3: titrating up on fentanyl patch 12.5 --> 25 mcg - pt reports pain is improved 5/6, will trial up in chair    Confusion and agitation - improved Hospital delirium  Pt was agitated, and also not responding to questions or commands during early hospitalization.  Mental status did start to improve, and now pt is calm and interactive, however, at this time she has been having waxing and waning mental status Delirium precautions cont seroquel 50 mg nightly to help with delirium and sleep.   ESRD: on HD MWF.  Pt refused HD on 10/17/22, 10/18/22 and 3/27, 4/1.  When attempts made to do HD in chair, she did not tolerate on account of severe back pain Recommend consideration for home hemodialysis if no facility capable to do HD in the bed   Generalized weakness / Physical Debility:  PT recs SNF. TOC following. Pt is bedridden and very high risk for developing pressure injuries of skin -- reposition frequently.   Monitor  pressures areas on skin and offload as possible.   Severe back and bilateral leg pain due to severe multi-level lumbar degenerative disc disease. Pt has bulging discs at L2-3, L3-4, L4-5 and L5-S1. Pt will continue to have back and leg pain once antibiotics are complete. Pain mgmt per orders  Pain control complicated by AMS and hypotension with escalation - titrating up on fentanyl patch slowly, cautious use of IV fentanyl for breakthrough pain    ACD:  likely secondary ESRD. H&H are stable  Monitor periodic CBC   Hypokalemia Replace as needed Monitor BMP   Peripheral neuropathy --  Continue low dose gabapentin and monitor   Hx of seizures:  continue on home dose of Keppra   HLD:  continue on statin    Hx of COPD: w/o exacerbation.  Continue on bronchodilators    DM2: well controlled HbA1c 5.2.  d/c'ed BG checks    HTN:  Antihypertensives as blood pressure allows   GERD:  continue on PPI    Depression: severity unknown.  Continue on home dose of sertraline     Overweight: BMI 28.2.  Wt loss efforts recommended   Right foot pain  see peripheral neuropathy   DVT prophylaxis: heparin sq Pertinent IV fluids/nutrition: no continuous IV fluids  Central lines / invasive devices: L IJ HD cath, port R chest   Code Status: DNR ACP documentation reviewed: 05/01 - MOST and advanced directive reviewed, [name] is HCPOA  Current Admission Status: inpatient  TOC needs / Dispo plan: SNF Barriers to discharge / significant pending items: unable to sit to toelrate outpatient HD, working w/ PT              Subjective / Brief ROS:  Patient reports no concerns  States pain is about same today Denies CP/SOB.      Family Communication: none at this time    Objective Findings:  Vitals:   11/28/22 1526 11/28/22 1910 11/29/22 0451 11/29/22 0700  BP: (!) 136/59 134/75 130/70 132/70  Pulse: 80 89 88 89  Resp: 18 18 17 18   Temp: 98.1 F (36.7 C)  98.7 F (37.1 C) 98.6 F (37 C) 98.3 F (36.8 C)  TempSrc:  Oral Oral Oral  SpO2: (!) 89% 95% 97% 97%  Weight:      Height:        Intake/Output Summary (Last 24 hours) at 11/29/2022 1317 Last data filed at 11/28/2022 1538 Gross per 24 hour  Intake 200 ml  Output --  Net 200 ml   Filed Weights   11/25/22 1600 11/28/22 0758 11/28/22 1146  Weight: 87 kg 88.2 kg 89 kg    Examination:  Physical Exam Constitutional:      General: She is not in acute distress.    Appearance: She is obese.  Cardiovascular:     Rate and Rhythm: Normal rate and regular rhythm.  Pulmonary:     Effort: Pulmonary effort is normal. No respiratory distress.     Breath sounds: Normal breath sounds.  Neurological:     General: No focal deficit present.     Mental Status:  She is alert. Mental status is at baseline.          Scheduled Medications:   (feeding supplement) PROSource Plus  30 mL Oral BID BM   Chlorhexidine Gluconate Cloth  6 each Topical Q0600   cinacalcet  30 mg Oral Q supper   epoetin (EPOGEN/PROCRIT) injection  4,000 Units Intravenous Q M,W,F-HD   feeding supplement (NEPRO CARB STEADY)  237 mL Oral TID BM   fentaNYL  1 patch Transdermal Q72H   gabapentin  100 mg Oral Once per day on Mon Wed Fri   Gerhardt's butt cream   Topical QID   levETIRAcetam  1,000 mg Oral Daily   levETIRAcetam  250 mg Oral Q M,W,F   metoprolol succinate  50 mg Oral Daily   pantoprazole  20 mg Oral BID   psyllium  1 packet Oral Daily   QUEtiapine  25 mg Oral QHS   sertraline  25 mg Oral Daily   sodium chloride flush  10-40 mL Intracatheter Q12H   umeclidinium-vilanterol  1 puff Inhalation Daily    Continuous Infusions:  sodium chloride Stopped (11/16/22 1155)   anticoagulant sodium citrate      ceFAZolin (ANCEF) IV Stopped (11/23/22 1121)    ceFAZolin (ANCEF) IV Stopped (11/25/22 1534)    PRN Medications:  alteplase, anticoagulant sodium citrate, fentaNYL (SUBLIMAZE) injection, guaiFENesin,  hydrALAZINE, lidocaine (PF), lidocaine-prilocaine, LORazepam, ondansetron, phenol, senna-docusate, sodium chloride flush  Antimicrobials from admission:  Anti-infectives (From admission, onward)    Start     Dose/Rate Route Frequency Ordered Stop   11/18/22 1200  ceFAZolin (ANCEF) IVPB 3g/100 mL premix        3 g 200 mL/hr over 30 Minutes Intravenous Every Fri (Hemodialysis) 11/13/22 1039 12/02/22 2359   11/17/22 0000  ceFAZolin (ANCEF) IVPB 1 g/50 mL premix       Note to Pharmacy: Send with pt to OR   1 g 100 mL/hr over 30 Minutes Intravenous On call 11/16/22 1154 11/16/22 1500   11/16/22 1200  ceFAZolin (ANCEF) IVPB 2g/100 mL premix        2 g 200 mL/hr over 30 Minutes Intravenous Every Wed (Hemodialysis) 11/13/22 1039 12/02/22 2359   11/14/22 1200  ceFAZolin (ANCEF) IVPB 2g/100 mL premix        2 g 200 mL/hr over 30 Minutes Intravenous Every Mon (Hemodialysis) 11/13/22 1039 11/28/22 1151   11/11/22 2200  ceFAZolin (ANCEF) IVPB 1 g/50 mL premix        1 g 100 mL/hr over 30 Minutes Intravenous Daily at 10 pm 11/10/22 1655 11/13/22 2320   11/09/22 1200  ceFAZolin (ANCEF) IVPB 2g/100 mL premix  Status:  Discontinued        2 g 200 mL/hr over 30 Minutes Intravenous Every Wed (Hemodialysis) 11/02/22 1246 11/03/22 1111   11/07/22 1700  ceFAZolin (ANCEF) IVPB 2g/100 mL premix        2 g 200 mL/hr over 30 Minutes Intravenous  Once 11/07/22 1610 11/07/22 1827   11/07/22 1200  ceFAZolin (ANCEF) IVPB 2g/100 mL premix  Status:  Discontinued        2 g 200 mL/hr over 30 Minutes Intravenous Every Mon (Hemodialysis) 11/02/22 1246 11/10/22 1655   11/04/22 1200  ceFAZolin (ANCEF) IVPB 3g/100 mL premix  Status:  Discontinued        3 g 200 mL/hr over 30 Minutes Intravenous Every Fri (Hemodialysis) 11/02/22 1246 11/10/22 1655   11/02/22 2200  ceFAZolin (ANCEF) IVPB 1 g/50 mL premix  Status:  Discontinued  1 g 100 mL/hr over 30 Minutes Intravenous Daily at bedtime 11/01/22 1452 11/02/22 1246    11/02/22 1330  ceFAZolin (ANCEF) IVPB 2g/100 mL premix  Status:  Discontinued        2 g 200 mL/hr over 30 Minutes Intravenous Every Wed (Hemodialysis) 11/02/22 1320 11/10/22 1655   11/02/22 1200  ceFAZolin (ANCEF) IVPB 2g/100 mL premix  Status:  Discontinued        2 g 200 mL/hr over 30 Minutes Intravenous Every Wed (Hemodialysis) 10/28/22 1004 11/01/22 1452   10/31/22 1200  ceFAZolin (ANCEF) IVPB 2g/100 mL premix  Status:  Discontinued        2 g 200 mL/hr over 30 Minutes Intravenous Every Mon (Hemodialysis) 10/28/22 1004 11/01/22 1452   10/28/22 1200  ceFAZolin (ANCEF) IVPB 3g/100 mL premix  Status:  Discontinued        3 g 200 mL/hr over 30 Minutes Intravenous Every Fri (Hemodialysis) 10/28/22 1004 11/01/22 1452   10/19/22 2200  ceFAZolin (ANCEF) IVPB 1 g/50 mL premix  Status:  Discontinued        1 g 100 mL/hr over 30 Minutes Intravenous Every 24 hours 10/18/22 0834 10/28/22 1004   10/18/22 1000  ceFAZolin (ANCEF) IVPB 2g/100 mL premix        2 g 200 mL/hr over 30 Minutes Intravenous  Once 10/18/22 0834 10/18/22 0946   10/17/22 1400  ceFEPIme (MAXIPIME) 1 g in sodium chloride 0.9 % 100 mL IVPB  Status:  Discontinued        1 g 200 mL/hr over 30 Minutes Intravenous Every 24 hours 10/17/22 1252 10/17/22 1520   10/14/22 1200  vancomycin (VANCOCIN) IVPB 1000 mg/200 mL premix  Status:  Discontinued        1,000 mg 200 mL/hr over 60 Minutes Intravenous Every M-W-F (Hemodialysis) 10/13/22 1356 10/19/22 0809   10/13/22 1400  ceFEPIme (MAXIPIME) 1 g in sodium chloride 0.9 % 100 mL IVPB  Status:  Discontinued        1 g 200 mL/hr over 30 Minutes Intravenous Every 24 hours 10/13/22 1140 10/17/22 1223   10/13/22 1230  vancomycin (VANCOREADY) IVPB 2000 mg/400 mL        2,000 mg 200 mL/hr over 120 Minutes Intravenous  Once 10/13/22 1140 10/13/22 1437   10/12/22 1645  vancomycin (VANCOREADY) IVPB 1500 mg/300 mL  Status:  Discontinued       See Hyperspace for full Linked Orders Report.    1,500 mg 150 mL/hr over 120 Minutes Intravenous  Once 10/12/22 1110 10/12/22 1753   10/12/22 1600  ceFEPIme (MAXIPIME) 1 g in sodium chloride 0.9 % 100 mL IVPB  Status:  Discontinued        1 g 200 mL/hr over 30 Minutes Intravenous Every 24 hours 10/12/22 1112 10/12/22 1753   10/12/22 1500  vancomycin (VANCOCIN) IVPB 1000 mg/200 mL premix  Status:  Discontinued       See Hyperspace for full Linked Orders Report.   1,000 mg 200 mL/hr over 60 Minutes Intravenous  Once 10/12/22 1110 10/12/22 1753   10/12/22 1115  ceFEPIme (MAXIPIME) 1 g in sodium chloride 0.9 % 100 mL IVPB  Status:  Discontinued        1 g 200 mL/hr over 30 Minutes Intravenous  Once 10/12/22 1110 10/12/22 1112           Data Reviewed:  I have personally reviewed the following...  CBC: Recent Labs  Lab 11/23/22 0446 11/25/22 1224 11/28/22 0759  WBC 7.0 7.4  7.7  NEUTROABS 3.1  --   --   HGB 9.3* 9.6* 9.0*  HCT 31.4* 32.2* 31.0*  MCV 82.4 84.5 84.5  PLT 189 230 222   Basic Metabolic Panel: Recent Labs  Lab 11/23/22 0446 11/25/22 1224 11/28/22 0759  NA 133* 136 138  K 4.5 3.8 3.7  CL 97* 98 98  CO2 25 27 26   GLUCOSE 133* 145* 122*  BUN 37* 34* 41*  CREATININE 6.63* 6.75* 7.67*  CALCIUM 10.0 10.1 10.5*  PHOS  --  5.7* 5.7*   GFR: Estimated Creatinine Clearance: 8.4 mL/min (A) (by C-G formula based on SCr of 7.67 mg/dL (H)). Liver Function Tests: Recent Labs  Lab 11/25/22 1224 11/28/22 0759  ALBUMIN 2.5* 2.6*   No results for input(s): "LIPASE", "AMYLASE" in the last 168 hours. No results for input(s): "AMMONIA" in the last 168 hours. Coagulation Profile: No results for input(s): "INR", "PROTIME" in the last 168 hours. Cardiac Enzymes: No results for input(s): "CKTOTAL", "CKMB", "CKMBINDEX", "TROPONINI" in the last 168 hours. BNP (last 3 results) No results for input(s): "PROBNP" in the last 8760 hours. HbA1C: No results for input(s): "HGBA1C" in the last 72 hours. CBG: No results for  input(s): "GLUCAP" in the last 168 hours.  Lipid Profile: No results for input(s): "CHOL", "HDL", "LDLCALC", "TRIG", "CHOLHDL", "LDLDIRECT" in the last 72 hours. Thyroid Function Tests: No results for input(s): "TSH", "T4TOTAL", "FREET4", "T3FREE", "THYROIDAB" in the last 72 hours. Anemia Panel: No results for input(s): "VITAMINB12", "FOLATE", "FERRITIN", "TIBC", "IRON", "RETICCTPCT" in the last 72 hours. Most Recent Urinalysis On File:     Component Value Date/Time   COLORURINE YELLOW (A) 03/20/2022 0125   APPEARANCEUR CLEAR (A) 03/20/2022 0125   APPEARANCEUR Clear 06/14/2014 1510   LABSPEC 1.010 03/20/2022 0125   LABSPEC 1.006 06/14/2014 1510   PHURINE 7.0 03/20/2022 0125   GLUCOSEU 50 (A) 03/20/2022 0125   GLUCOSEU >=500 06/14/2014 1510   HGBUR MODERATE (A) 03/20/2022 0125   BILIRUBINUR NEGATIVE 03/20/2022 0125   BILIRUBINUR Negative 06/14/2014 1510   KETONESUR NEGATIVE 03/20/2022 0125   PROTEINUR 100 (A) 03/20/2022 0125   NITRITE NEGATIVE 03/20/2022 0125   LEUKOCYTESUR NEGATIVE 03/20/2022 0125   LEUKOCYTESUR Negative 06/14/2014 1510   Sepsis Labs: @LABRCNTIP (procalcitonin:4,lacticidven:4) Microbiology: No results found for this or any previous visit (from the past 240 hour(s)).    Radiology Studies last 3 days: No results found.           LOS: 48 days      Sunnie Nielsen, DO [facility] 11/29/2022, 1:17 PM    Dictation software may have been used to generate the above note. Typos may occur and escape review in typed/dictated notes. Please contact Dr Lyn Hollingshead directly for clarity if needed.  Staff may message me via secure chat in Epic  but this may not receive an immediate response,  please page me for urgent matters!  If 7PM-7AM, please contact night coverage [email or web address]

## 2022-11-29 NOTE — Progress Notes (Signed)
[facility]  ROUNDING NOTE   Subjective:   Cynthia Dean is a 72 y.o. 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 She presents to the ED with worsening lower extremity pain. Patient will be admitted for Back pain [M54.9] Bilateral leg pain [M79.604, M79.605]  Patient sitting up in bed Nurse at bedside, preparing meds When informing patient that dialysis will be tomorrow, patient states that she wish we would stop doing all this.    Objective:  Vital signs in last 24 hours:  Temp:  [98.1 F (36.7 C)-98.7 F (37.1 C)] 98.3 F (36.8 C) (05/07 0700) Pulse Rate:  [74-89] 89 (05/07 0700) Resp:  [17-29] 18 (05/07 0700) BP: (99-136)/(23-82) 132/70 (05/07 0700) SpO2:  [89 %-99 %] 97 % (05/07 0700) Weight:  [89 kg] 89 kg (05/06 1146)  Weight change:  Filed Weights   11/25/22 1600 11/28/22 0758 11/28/22 1146  Weight: 87 kg 88.2 kg 89 kg    Intake/Output: I/O last 3 completed shifts: In: 200 [IV Piggyback:200] Out: 0    Intake/Output this shift:  No intake/output data recorded.  Physical Exam: General: NAD  Head: Normocephalic, atraumatic.  Moist oral mucosal membranes  Eyes: Anicteric  Lungs:  Clear to auscultation  Heart: Regular rate and rhythm  Abdomen:  Soft, nontender  Extremities: No peripheral edema.  Neurologic: moving all extremities   Skin: No lesions  Access: Left AVG (no bruit/thrill)), left chest PermCath    Basic Metabolic Panel: Recent Labs  Lab 11/23/22 0446 11/25/22 1224 11/28/22 0759  NA 133* 136 138  K 4.5 3.8 3.7  CL 97* 98 98  CO2 25 27 26   GLUCOSE 133* 145* 122*  BUN 37* 34* 41*  CREATININE 6.63* 6.75* 7.67*  CALCIUM 10.0 10.1 10.5*  PHOS  --  5.7* 5.7*     Liver Function Tests: Recent Labs  Lab 11/25/22 1224 11/28/22 0759  ALBUMIN 2.5* 2.6*    No results for input(s):  "LIPASE", "AMYLASE" in the last 168 hours. No results for input(s): "AMMONIA" in the last 168 hours.  CBC: Recent Labs  Lab 11/23/22 0446 11/25/22 1224 11/28/22 0759  WBC 7.0 7.4 7.7  NEUTROABS 3.1  --   --   HGB 9.3* 9.6* 9.0*  HCT 31.4* 32.2* 31.0*  MCV 82.4 84.5 84.5  PLT 189 230 222     Cardiac Enzymes: No results for input(s): "CKTOTAL", "CKMB", "CKMBINDEX", "TROPONINI" in the last 168 hours.  BNP: Invalid input(s): "POCBNP"  CBG: No results for input(s): "GLUCAP" in the last 168 hours.   Microbiology: Results for orders placed or performed during the hospital encounter of 10/12/22  Blood culture (routine x 2)     Status: None   Collection Time: 10/12/22  5:40 PM   Specimen: BLOOD  Result Value Ref Range Status   Specimen Description BLOOD BLOOD RIGHT HAND [facility]  Final   Special Requests   Final    BOTTLES DRAWN AEROBIC AND ANAEROBIC Blood Culture results may not be optimal due to an inadequate volume of blood received in culture bottles   Culture   Final    NO GROWTH 5 DAYS Performed at [facility], [address], [place], [place] [address]    Report Status 10/17/2022 FINAL  Final  Blood culture (routine x 2)     Status: None   Collection Time: 10/12/22  6:56 PM   Specimen: BLOOD  Result  Value Ref Range Status   Specimen Description BLOOD BLOOD RIGHT FOREARM  Final   Special Requests   Final    BOTTLES DRAWN AEROBIC ONLY Blood Culture results may not be optimal due to an inadequate volume of blood received in culture bottles   Culture   Final    NO GROWTH 5 DAYS Performed at [facility], [address], [place], [place] [address]    Report Status 10/17/2022 FINAL  Final  Aerobic/Anaerobic Culture w Gram Stain (surgical/deep wound)     Status: None   Collection Time: 10/13/22 11:44 AM   Specimen: Abscess  Result Value Ref Range Status   Specimen Description   Final    ABSCESS Performed at [facility], [address] [place], [place] [address]    Special Requests   Final    MUSCLE ABSCESS Performed at [facility], [address] [place], [place] [address]    Gram Stain   Final    ABUNDANT WBC PRESENT, PREDOMINANTLY PMN NO ORGANISMS SEEN    Culture   Final    FEW STAPHYLOCOCCUS CAPITIS NO ANAEROBES ISOLATED Performed at [facility], [address] [place], [place] [address]    Report Status 10/18/2022 FINAL  Final   Organism ID, Bacteria STAPHYLOCOCCUS CAPITIS  Final      Susceptibility   Staphylococcus capitis - MIC*    CIPROFLOXACIN <=0.5 SENSITIVE Sensitive     ERYTHROMYCIN >=8 RESISTANT Resistant     GENTAMICIN <=0.5 SENSITIVE Sensitive     OXACILLIN <=0.25 SENSITIVE Sensitive     TETRACYCLINE <=1 SENSITIVE Sensitive     VANCOMYCIN 1 SENSITIVE Sensitive     TRIMETH/SULFA <=10 SENSITIVE Sensitive     CLINDAMYCIN INTERMEDIATE Intermediate     RIFAMPIN <=0.5 SENSITIVE Sensitive     Inducible Clindamycin NEGATIVE Sensitive     * FEW STAPHYLOCOCCUS CAPITIS    Coagulation Studies: No results for input(s): "LABPROT", "INR" in the last 72 hours.  Urinalysis: No results for input(s): "COLORURINE", "LABSPEC", "PHURINE", "GLUCOSEU", "HGBUR", "BILIRUBINUR", "KETONESUR", "PROTEINUR", "UROBILINOGEN", "NITRITE", "LEUKOCYTESUR" in the last 72 hours.  Invalid input(s): "APPERANCEUR"    Imaging: No results found.   Medications:    sodium chloride Stopped (11/16/22 1155)   anticoagulant sodium citrate      ceFAZolin (ANCEF) IV Stopped (11/23/22 1121)    ceFAZolin (ANCEF) IV Stopped (11/25/22 1534)    (feeding supplement) PROSource Plus  30 mL Oral BID BM   Chlorhexidine Gluconate Cloth  6 each Topical Q0600   cinacalcet  30 mg Oral Q supper   epoetin (EPOGEN/PROCRIT) injection  4,000 Units Intravenous Q M,W,F-HD   feeding supplement (NEPRO CARB STEADY)  237 mL Oral TID BM   fentaNYL  1 patch Transdermal Q72H   gabapentin  100 mg Oral Once per day on Mon Wed Fri    Gerhardt's butt cream   Topical QID   levETIRAcetam  1,000 mg Oral Daily   levETIRAcetam  250 mg Oral Q M,W,F   metoprolol succinate  50 mg Oral Daily   pantoprazole  20 mg Oral BID   psyllium  1 packet Oral Daily   QUEtiapine  25 mg Oral QHS   sertraline  25 mg Oral Daily   sodium chloride flush  10-40 mL Intracatheter Q12H   umeclidinium-vilanterol  1 puff Inhalation Daily   alteplase, anticoagulant sodium citrate, fentaNYL (SUBLIMAZE) injection, guaiFENesin, hydrALAZINE, lidocaine (PF), lidocaine-prilocaine, LORazepam, ondansetron, phenol, senna-docusate, sodium chloride flush  Assessment/ Plan:  Cynthia Dean  Cynthia Dean is a 72 y.o.  female with end stage renal disease on hemodialysis, coronary artery disease, COPD with intermittent home O2, hypertension, hyperlipidemia, stroke, diabetes mellitus type II, and breast cancer who was admitted to [facility] on 10/12/2022 for Back pain [M54.9] Bilateral leg pain [M79.604, M79.605]  Patient was found to have vertebral osteomyelitis and psoas abscess. Cultures positive for staph capitis  [facility] MWF Left AVG 102.5kg  End-stage renal disease on hemodialysis. on MWF schedule.     Received dialysis yesterday, received NS bolus to maintain blood pressure. Next treatment scheduled for Wednesday.   Patient making statements to stop treatments. Encouraged patent to discuss her wishes with her daughter and family.   Patient remains unable to sit upright for dialysis.   Anemia of chronic kidney disease Normocytic Lab Results  Component Value Date   HGB 9.0 (L) 11/28/2022  Patient receives Mircera at outpatient clinic.  Continue low dose EPO with dialysis.   Secondary Hyperparathyroidism: with outpatient labs: PTH 180 (3/11), phosphorus 5.4, calcium 9.3   Lab Results  Component Value Date   PTH 131 (H) 03/14/2018   CALCIUM 10.5 (H) 11/28/2022   CAION 1.09 (L) 08/28/2022   PHOS 5.7 (H) 11/28/2022   -Continue Cinacalcet. -  Calcium and phosphorus elevated. Will continue to monitor.   Hypotension during hemodialysis treatment.  Blood pressure 132/70 today.   Diabetes mellitus type II with chronic kidney disease/renal manifestations: insulin dependent, NPH.    Vertebral osteomyelitis, seen on CT lumber spine. Neurosurgery feels patient is not a surgical candidate CT guided aspiration of psoas abscess on 10/13/22. Cultures positive for Staph Capitis.   -Infectious disease: Continue cefazolin with dialysis, until 12/02/22.    LOS: 48   5/7/202410:50 AM

## 2022-11-30 DIAGNOSIS — M4646 Discitis, unspecified, lumbar region: Secondary | ICD-10-CM | POA: Diagnosis not present

## 2022-11-30 LAB — RENAL FUNCTION PANEL
Albumin: 2.4 g/dL — ABNORMAL LOW (ref 3.5–5.0)
Anion gap: 9 (ref 5–15)
BUN: 40 mg/dL — ABNORMAL HIGH (ref 8–23)
CO2: 28 mmol/L (ref 22–32)
Calcium: 10.2 mg/dL (ref 8.9–10.3)
Chloride: 99 mmol/L (ref 98–111)
Creatinine, Ser: 6.28 mg/dL — ABNORMAL HIGH (ref 0.44–1.00)
GFR, Estimated: 7 mL/min — ABNORMAL LOW (ref >60)
Glucose, Bld: 144 mg/dL — ABNORMAL HIGH (ref 70–99)
Phosphorus: 5.3 mg/dL — ABNORMAL HIGH (ref 2.5–4.6)
Potassium: 3.7 mmol/L (ref 3.5–5.1)
Sodium: 136 mmol/L (ref 135–145)

## 2022-11-30 LAB — CBC
HCT: 29.4 % — ABNORMAL LOW (ref 36.0–46.0)
Hemoglobin: 8.8 g/dL — ABNORMAL LOW (ref 12.0–15.0)
MCH: 25.2 pg — ABNORMAL LOW (ref 26.0–34.0)
MCHC: 29.9 g/dL — ABNORMAL LOW (ref 30.0–36.0)
MCV: 84.2 fL (ref 80.0–100.0)
Platelets: 192 K/uL (ref 150–400)
RBC: 3.49 MIL/uL — ABNORMAL LOW (ref 3.87–5.11)
RDW: 19.5 % — ABNORMAL HIGH (ref 11.5–15.5)
WBC: 6.8 K/uL (ref 4.0–10.5)
nRBC: 0 % (ref 0.0–0.2)

## 2022-11-30 MED ORDER — HEPARIN SODIUM (PORCINE) 1000 UNIT/ML DIALYSIS: 1000.0000 [IU] | INTRAMUSCULAR | Status: DC | PRN

## 2022-11-30 MED ORDER — EPOETIN ALFA 4000 UNIT/ML IJ SOLN
INTRAMUSCULAR | Status: AC
  Filled 2022-11-30: qty 1

## 2022-11-30 MED ORDER — HEPARIN SODIUM (PORCINE) 1000 UNIT/ML IJ SOLN
INTRAMUSCULAR | Status: AC
  Filled 2022-11-30: qty 10

## 2022-11-30 MED ORDER — HEPARIN SODIUM (PORCINE) 1000 UNIT/ML DIALYSIS
25.0000 [IU]/kg | INTRAMUSCULAR | Status: DC | PRN
  Administered 2022-11-30: 2300 [IU] via INTRAVENOUS_CENTRAL
  Filled 2022-11-30: qty 3

## 2022-11-30 NOTE — Progress Notes (Signed)
Progress Note   [name] JXB:[id number] DOB: February 02, 1951 DOA: 10/12/2022     49 DOS: the patient was seen and examined on 11/30/2022    Subjective:  Patient seen and examined at bedside in the presence of the boyfriend She did complain of some back pain She was seen after dialysis and she had just gotten to her room after she was moved to her bed Denies chest pain nausea vomiting or abdominal pain    Brief Narrative / Hospital Course:  Cynthia Dean is a 72 y.o. 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 She presents to the ED with worsening lower extremity pain. Admitted for possible Discitis/osteomyelitis at L4-L5: w/ suspected intradiscal abscess as per MRI. 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    4/18, HD access / fistula clotted off during dialysis. 4/19 pt taken for procedure to address fistula malfunction, but was agitated and combative, resfused procedure. 4/19 vascular placed R femoral temp HD cath placed, resumed HD and pemcath placed 4/24   Patient has had waxing and waning mental status with intermittently refusing dialysis. Patient's family have decided to continue hemodialysis after speaking with their PCP. Hospital course prolonged, complicated by patient's inability to tolerate sitting in recliner chair for dialysis.  She only agrees to dialysis in the bed.  This is a barrier to discharge as no outpatient dialysis centers can accommodate.       5/3: titrating up on fentanyl patch 12.5 --> 25 mcg, seems to be helping, if still unable to sit up in chair may consider increasing dose further     Consultants:  Neurosurgery Infectious Disease Vascular surgery  Nephrology    Procedures: 09/22/22: Stent placement to the AV graft  10/13/22: CT aspiration of right  psoas muscle abscess yielding 2 ml purulent material  11/11/22: R Femoral V catheter placement 11/16/22: Permcath placement to L IJ            ASSESSMENT & PLAN:   Principal Problem:   Back pain Active Problems:   Anemia   Bilateral leg pain   ESRD on dialysis [name])   Essential hypertension   Dyslipidemia   Seizure disorder (HCC)   Diabetic retinopathy without macular edema associated with type 2 diabetes mellitus (HCC)   GERD (gastroesophageal reflux disease)   Diabetic neuropathy (HCC)   Anemia of chronic disease   Insulin-requiring or dependent type II diabetes mellitus (HCC)   Discitis of lumbar region   Myositis   Psoas abscess (HCC)     Lumbar discitis, 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   Wound cx growing staph capitis.   ID consulted.  Abx de-escalated to cefazolin.   Not a good surgical candidate as per neuro surg.   Pt having severe low back and bilateral leg pain and has not been able to tolerate any position except laying nearly flat in the bed cont cefazolin, for 6 weeks, End Date: 12/02/22 pain management per orders -- palliative care following peripherally 5/3: titrating up on fentanyl patch 12.5 --> 25 mcg - pt reports pain is improved 5/6, will trial up in chair    Confusion and agitation - improved Hospital delirium  Pt was agitated, and also not  responding to questions or commands during early hospitalization.  Mental status did start to improve, and now pt is calm and interactive, however, at this time she has been having waxing and waning mental status Delirium precautions cont seroquel 50 mg nightly to help with delirium and sleep.   ESRD: on HD MWF.  Pt refused HD on 10/17/22, 10/18/22 and 3/27, 4/1.   When attempts made to do HD in chair, she did not tolerate on account of severe back  pain Recommend consideration for home hemodialysis if no facility capable to do HD in the bed   Generalized weakness / Physical Debility:  PT recs SNF. TOC following. Pt is bedridden and very high risk for developing pressure injuries of skin -- reposition frequently.   Monitor pressures areas on skin and offload as possible.   Severe back and bilateral leg pain due to severe multi-level lumbar degenerative disc disease. Pt has bulging discs at L2-3, L3-4, L4-5 and L5-S1. Pt will continue to have back and leg pain once antibiotics are complete. Pain mgmt per orders  Pain control complicated by AMS and hypotension with escalation - titrating up on fentanyl patch slowly, cautious use of IV fentanyl for breakthrough pain    ACD:  likely secondary ESRD. H&H are stable  Monitor periodic CBC   Hypokalemia Replace as needed Monitor BMP   Peripheral neuropathy --  Continue low dose gabapentin and monitor   Hx of seizures:  continue on home dose of Keppra   HLD:  continue on statin    Hx of COPD: w/o exacerbation.  Continue on bronchodilators    DM2: well controlled HbA1c 5.2.  d/c'ed BG checks    HTN:  Antihypertensives as blood pressure allows   GERD:  continue on PPI    Depression: severity unknown.  Continue on home dose of sertraline     Overweight: BMI 28.2.  Wt loss efforts recommended   Right foot pain  see peripheral neuropathy     DVT prophylaxis: heparin sq  Central lines / invasive devices: L IJ HD cath, port R chest    Code Status: DNR ACP documentation reviewed: 05/01 - MOST and advanced directive reviewed, [name] is HCPOA   Current Admission Status: inpatient  TOC needs / Dispo plan: SNF Barriers to discharge / significant pending items: unable to sit to toelrate outpatient HD, working w/ PT     Family Communication: Boyfriend present at bedside  Physical Exam Constitutional:      General: She is not in acute distress.     Appearance: She is obese.  Cardiovascular:     Rate and Rhythm: Normal rate and regular rhythm.  Pulmonary:     Effort: Pulmonary effort is normal. No respiratory distress.     Breath sounds: Normal breath sounds.  Neurological:     General: No focal deficit present.     Mental Status: She is alert. Mental status is at baseline.       Vitals:   11/30/22 1130 11/30/22 1200 11/30/22 1215 11/30/22 1223  BP: 134/72 132/72 134/71 133/70  Pulse: 87 85 86 86  Resp: 20 13 15 17   Temp:    98.6 F (37 C)  TempSrc:    Oral  SpO2: 100% 100% 100% 98%  Weight:    91.5 kg  Height:         Time spent: 38 minutes  Author: Loyce Dys, MD 11/30/2022 2:39 PM  For on call review [email or web address].

## 2022-11-30 NOTE — Progress Notes (Signed)
0981: Arterial port is not pulling but is flushing well. Reversed lines and started tx.

## 2022-11-30 NOTE — Progress Notes (Signed)
[facility]  ROUNDING NOTE   Subjective:   Cynthia Dean is a 72 y.o. 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 She presents to the ED with worsening lower extremity pain. Patient will be admitted for Back pain [M54.9] Bilateral leg pain [M79.604, M79.605]  Patient seen and evaluated during dialysis   HEMODIALYSIS FLOWSHEET:  Blood Flow Rate (mL/min): 400 mL/min Arterial Pressure (mmHg): -180 mmHg Venous Pressure (mmHg): 200 mmHg TMP (mmHg): 2 mmHg Ultrafiltration Rate (mL/min): 228 mL/min Dialysate Flow Rate (mL/min): 300 ml/min Dialysis Fluid Bolus: Normal Saline Bolus Amount (mL): 500 mL  Patient tolerating treatment well, UF 0   Objective:  Vital signs in last 24 hours:  Temp:  [97.7 F (36.5 C)-98.6 F (37 C)] 98.6 F (37 C) (05/08 0808) Pulse Rate:  [79-92] 85 (05/08 1200) Resp:  [13-24] 13 (05/08 1200) BP: (131-165)/(68-83) 132/72 (05/08 1200) SpO2:  [92 %-100 %] 100 % (05/08 1200) Weight:  [90.7 kg] 90.7 kg (05/08 0808)  Weight change:  Filed Weights   11/28/22 0758 11/28/22 1146 11/30/22 0808  Weight: 88.2 kg 89 kg 90.7 kg    Intake/Output: I/O last 3 completed shifts: In: 240 [P.O.:240] Out: -    Intake/Output this shift:  No intake/output data recorded.  Physical Exam: General: NAD  Head: Normocephalic, atraumatic.  Moist oral mucosal membranes  Eyes: Anicteric  Lungs:  Clear to auscultation  Heart: Regular rate and rhythm  Abdomen:  Soft, nontender  Extremities: No peripheral edema.  Neurologic: moving all extremities   Skin: No lesions  Access: Left AVG (no bruit/thrill)), left chest PermCath    Basic Metabolic Panel: Recent Labs  Lab 11/25/22 1224 11/28/22 0759 11/30/22 0837  NA 136 138 136  K 3.8 3.7 3.7  CL 98 98 99  CO2 27 26 28   GLUCOSE 145* 122* 144*  BUN 34* 41* 40*   CREATININE 6.75* 7.67* 6.28*  CALCIUM 10.1 10.5* 10.2  PHOS 5.7* 5.7* 5.3*     Liver Function Tests: Recent Labs  Lab 11/25/22 1224 11/28/22 0759 11/30/22 0837  ALBUMIN 2.5* 2.6* 2.4*    No results for input(s): "LIPASE", "AMYLASE" in the last 168 hours. No results for input(s): "AMMONIA" in the last 168 hours.  CBC: Recent Labs  Lab 11/25/22 1224 11/28/22 0759 11/30/22 0837  WBC 7.4 7.7 6.8  HGB 9.6* 9.0* 8.8*  HCT 32.2* 31.0* 29.4*  MCV 84.5 84.5 84.2  PLT 230 222 192     Cardiac Enzymes: No results for input(s): "CKTOTAL", "CKMB", "CKMBINDEX", "TROPONINI" in the last 168 hours.  BNP: Invalid input(s): "POCBNP"  CBG: No results for input(s): "GLUCAP" in the last 168 hours.   Microbiology: Results for orders placed or performed during the hospital encounter of 10/12/22  Blood culture (routine x 2)     Status: None   Collection Time: 10/12/22  5:40 PM   Specimen: BLOOD  Result Value Ref Range Status   Specimen Description BLOOD BLOOD RIGHT HAND [facility]  Final   Special Requests   Final    BOTTLES DRAWN AEROBIC AND ANAEROBIC Blood Culture results may not be optimal due to an inadequate volume of blood received in culture bottles   Culture   Final    NO GROWTH 5 DAYS Performed at [facility], [address], [place], [place] [address]    Report Status 10/17/2022 FINAL  Final  Blood culture (routine x 2)  Status: None   Collection Time: 10/12/22  6:56 PM   Specimen: BLOOD  Result Value Ref Range Status   Specimen Description BLOOD BLOOD RIGHT FOREARM  Final   Special Requests   Final    BOTTLES DRAWN AEROBIC ONLY Blood Culture results may not be optimal due to an inadequate volume of blood received in culture bottles   Culture   Final    NO GROWTH 5 DAYS Performed at [facility], [address], [place], [place] [address]    Report Status 10/17/2022 FINAL  Final  Aerobic/Anaerobic Culture w Gram Stain (surgical/deep wound)      Status: None   Collection Time: 10/13/22 11:44 AM   Specimen: Abscess  Result Value Ref Range Status   Specimen Description   Final    ABSCESS Performed at [facility], [address], [place], [place] [address]    Special Requests   Final    MUSCLE ABSCESS Performed at [facility], [address] [place], [place] [address]    Gram Stain   Final    ABUNDANT WBC PRESENT, PREDOMINANTLY PMN NO ORGANISMS SEEN    Culture   Final    FEW STAPHYLOCOCCUS CAPITIS NO ANAEROBES ISOLATED Performed at [facility], [address] [place], [place] [address]    Report Status 10/18/2022 FINAL  Final   Organism ID, Bacteria STAPHYLOCOCCUS CAPITIS  Final      Susceptibility   Staphylococcus capitis - MIC*    CIPROFLOXACIN <=0.5 SENSITIVE Sensitive     ERYTHROMYCIN >=8 RESISTANT Resistant     GENTAMICIN <=0.5 SENSITIVE Sensitive     OXACILLIN <=0.25 SENSITIVE Sensitive     TETRACYCLINE <=1 SENSITIVE Sensitive     VANCOMYCIN 1 SENSITIVE Sensitive     TRIMETH/SULFA <=10 SENSITIVE Sensitive     CLINDAMYCIN INTERMEDIATE Intermediate     RIFAMPIN <=0.5 SENSITIVE Sensitive     Inducible Clindamycin NEGATIVE Sensitive     * FEW STAPHYLOCOCCUS CAPITIS    Coagulation Studies: No results for input(s): "LABPROT", "INR" in the last 72 hours.  Urinalysis: No results for input(s): "COLORURINE", "LABSPEC", "PHURINE", "GLUCOSEU", "HGBUR", "BILIRUBINUR", "KETONESUR", "PROTEINUR", "UROBILINOGEN", "NITRITE", "LEUKOCYTESUR" in the last 72 hours.  Invalid input(s): "APPERANCEUR"    Imaging: No results found.   Medications:    sodium chloride Stopped (11/16/22 1155)   anticoagulant sodium citrate      ceFAZolin (ANCEF) IV 2 g (11/30/22 1142)    ceFAZolin (ANCEF) IV Stopped (11/25/22 1534)    (feeding supplement) PROSource Plus  30 mL Oral BID BM   Chlorhexidine Gluconate Cloth  6 each Topical Q0600   cinacalcet  30 mg Oral Q supper   epoetin (EPOGEN/PROCRIT) injection   4,000 Units Intravenous Q M,W,F-HD   feeding supplement (NEPRO CARB STEADY)  237 mL Oral TID BM   fentaNYL  1 patch Transdermal Q72H   gabapentin  100 mg Oral Once per day on Mon Wed Fri   Gerhardt's butt cream   Topical QID   heparin injection (subcutaneous)  5,000 Units Subcutaneous Q8H   levETIRAcetam  1,000 mg Oral Daily   levETIRAcetam  250 mg Oral Q M,W,F   metoprolol succinate  50 mg Oral Daily   pantoprazole  20 mg Oral BID   psyllium  1 packet Oral Daily   QUEtiapine  25 mg Oral QHS   sertraline  25 mg Oral Daily   sodium chloride flush  10-40 mL Intracatheter Q12H   umeclidinium-vilanterol  1 puff Inhalation Daily  alteplase, anticoagulant sodium citrate, fentaNYL (SUBLIMAZE) injection, guaiFENesin, heparin, heparin, hydrALAZINE, lidocaine (PF), lidocaine-prilocaine, LORazepam, ondansetron, phenol, senna-docusate, sodium chloride flush  Assessment/ Plan:  Cynthia Dean is a 72 y.o.  female with end stage renal disease on hemodialysis, coronary artery disease, COPD with intermittent home O2, hypertension, hyperlipidemia, stroke, diabetes mellitus type II, and breast cancer who was admitted to [facility] on 10/12/2022 for Back pain [M54.9] Bilateral leg pain [M79.604, M79.605]  Patient was found to have vertebral osteomyelitis and psoas abscess. Cultures positive for staph capitis  [facility] MWF Left AVG 102.5kg  End-stage renal disease on hemodialysis. on MWF schedule.     Patient received dialysis today, UF 0.  Patient received initial 500 mL normal saline bolus prior to initiation of treatment.  Next treatment scheduled for Friday.  Patient continues to states she is tired and wants to stop treatments.  Patient notified that she must speak with her family about her wishes.  Patient informed that she must be able to sit in recliner to tolerate outpatient dialysis treatments prior to discharge.      Anemia of chronic kidney disease Normocytic Lab  Results  Component Value Date   HGB 8.8 (L) 11/30/2022  Patient receives Mircera at outpatient clinic.  Receiving EPO with dialysis treatments.  Secondary Hyperparathyroidism: with outpatient labs: PTH 180 (3/11), phosphorus 5.4, calcium 9.3   Lab Results  Component Value Date   PTH 131 (H) 03/14/2018   CALCIUM 10.2 11/30/2022   CAION 1.09 (L) 08/28/2022   PHOS 5.3 (H) 11/30/2022   -Continue Cinacalcet. -Bone minerals acceptable.  Will continue to monitor.  Hypotension during hemodialysis treatment.  Blood pressure 132/72 during dialysis.  Diabetes mellitus type II with chronic kidney disease/renal manifestations: insulin dependent, NPH.    Vertebral osteomyelitis, seen on CT lumber spine. Neurosurgery feels patient is not a surgical candidate CT guided aspiration of psoas abscess on 10/13/22. Cultures positive for Staph Capitis.   -Infectious disease: Continue cefazolin with dialysis, until 12/02/22.    LOS: 49   5/8/202412:07 PM

## 2022-11-30 NOTE — Progress Notes (Signed)
Nutrition Follow-up  DOCUMENTATION CODES:   Not applicable  INTERVENTION:   -Continue renal MVI daily -Continue 30 ml Prosource Plus BID, each supplement provides 100 kcals and 15 grams protein -Continue Nepro Shake po TID, each supplement provides 425 kcal and 19 grams protein  -Continue 2 gram sodium diet for wider variety of meal selections  NUTRITION DIAGNOSIS:   Increased nutrient needs related to chronic illness (ESRD on HD) as evidenced by estimated needs.  Ongoing  GOAL:   Patient will meet greater than or equal to 90% of their needs  Progressing   MONITOR:   PO intake, Supplement acceptance  REASON FOR ASSESSMENT:   Malnutrition Screening Tool    ASSESSMENT:   Pt with PMH of ESRD on HD, HLD, COPD, DM2, HTN, depression, CVA who presents with back pain x 3 weeks PTA.  4/19- triple lumen HD cath placed secondary to clotted AVG  4/24- tunneled HD cath placed via lt internal jugular vein  Reviewed I/O's: +240 ml x 24 hours and +90 ml since 11/16/22  Pt out of room at time of visit. No family present. Pt currently in HD suite.   Pt with improves oral intake. Noted meal completions 75-100%. Pt also taking Prosource and Nepro supplements. Noted finished Nepro supplement on bedside table.    Wt has been stable over the past month.   Palliative care continues to follow for goals of care. Pt continues to intermittently make comments about not continuing HD. HD remains a barrier to discharge, as pt unable to tolerate HD in a chair secondary to back pain. Per MD notes, home HD is being investigated.   Medications reviewed and include keppra.   Labs reviewed: Na: 131.    Diet Order:   Diet Order             Diet 2 gram sodium Fluid consistency: Thin  Diet effective now                   EDUCATION NEEDS:   No education needs have been identified at this time  Skin:  Skin Assessment: Skin Integrity Issues: Skin Integrity Issues:: Other  (Comment) Other: IAD to lt buttocks  Last BM:  11/25/22  Height:   Ht Readings from Last 1 Encounters:  10/12/22 6' (1.829 m)    Weight:   Wt Readings from Last 1 Encounters:  11/30/22 90.7 kg    Ideal Body Weight:  72.7 kg  BMI:  Body mass index is 27.12 kg/m.  Estimated Nutritional Needs:   Kcal:  2150-2350  Protein:  105-120 grams  Fluid:  1000 ml + UOP    Levada Schilling, RD, LDN, CDCES Registered Dietitian II Certified Diabetes Care and Education Specialist Please refer to [facility] for RD and/or RD on-call/weekend/after hours pager

## 2022-11-30 NOTE — Progress Notes (Addendum)
Hemodialysis note  Received patient in bed to unit. Alert and oriented to self and place.  Informed consent signed and in chart.  Treatment initiated: 1610 Treatment completed: 1215  Patient tolerated well. Transported back to room, alert without acute distress.  Report given to patient's RN.   Access used: Left Chest HD PermCath Access issues: Arterial port is not pulling but is flushing well. Reversed lines.  Total UF removed: 0 Medication(s) given:  Cefazolin 2g IV, Epogen 4000 units  Post HD weight: 91.5 kg   Wolfgang Phoenix [facility]

## 2022-12-01 ENCOUNTER — Inpatient Hospital Stay: Payer: 59 | Admitting: Infectious Diseases

## 2022-12-01 DIAGNOSIS — M4646 Discitis, unspecified, lumbar region: Secondary | ICD-10-CM | POA: Diagnosis not present

## 2022-12-01 LAB — BASIC METABOLIC PANEL
Anion gap: 10 (ref 5–15)
BUN: 20 mg/dL (ref 8–23)
CO2: 28 mmol/L (ref 22–32)
Calcium: 9.7 mg/dL (ref 8.9–10.3)
Chloride: 95 mmol/L — ABNORMAL LOW (ref 98–111)
Creatinine, Ser: 3.89 mg/dL — ABNORMAL HIGH (ref 0.44–1.00)
GFR, Estimated: 12 mL/min — ABNORMAL LOW (ref >60)
Glucose, Bld: 82 mg/dL (ref 70–99)
Potassium: 3.8 mmol/L (ref 3.5–5.1)
Sodium: 133 mmol/L — ABNORMAL LOW (ref 135–145)

## 2022-12-01 NOTE — Progress Notes (Signed)
Progress Note   [name] ZOX:[id number] DOB: 1950-10-26 DOA: 10/12/2022     50 DOS: the patient was seen and examined on 12/01/2022     Subjective:   Patient seen and examined at bedside Patient was here today to eat her breakfast She did not have any complaints of pain today Denies chest pain nausea vomiting or abdominal pain     Brief Narrative / Hospital Course:  Cynthia Dean is a 72 y.o. 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 She presents to the ED with worsening lower extremity pain. Admitted for possible Discitis/osteomyelitis at L4-L5: w/ suspected intradiscal abscess as per MRI. 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    4/18, HD access / fistula clotted off during dialysis. 4/19 pt taken for procedure to address fistula malfunction, but was agitated and combative, resfused procedure. 4/19 vascular placed R femoral temp HD cath placed, resumed HD and pemcath placed 4/24   Patient has had waxing and waning mental status with intermittently refusing dialysis. Patient's family have decided to continue hemodialysis after speaking with their PCP. Hospital course prolonged, complicated by patient's inability to tolerate sitting in recliner chair for dialysis.  She only agrees to dialysis in the bed.  This is a barrier to discharge as no outpatient dialysis centers can accommodate.       5/3: titrating up on fentanyl patch 12.5 --> 25 mcg, seems to be helping, if still unable to sit up in chair may consider increasing dose further      Consultants:  Neurosurgery Infectious Disease Vascular surgery  Nephrology    Procedures: 09/22/22: Stent placement to the AV graft  10/13/22: CT aspiration of right psoas muscle abscess yielding 2 ml purulent material  11/11/22: R  Femoral V catheter placement 11/16/22: Permcath placement to L IJ        ASSESSMENT & PLAN:   Principal Problem:   Back pain Active Problems:   Anemia   Bilateral leg pain   ESRD on dialysis [facility])   Essential hypertension   Dyslipidemia   Seizure disorder (HCC)   Diabetic retinopathy without macular edema associated with type 2 diabetes mellitus (HCC)   GERD (gastroesophageal reflux disease)   Diabetic neuropathy (HCC)   Anemia of chronic disease   Insulin-requiring or dependent type II diabetes mellitus (HCC)   Discitis of lumbar region   Myositis   Psoas abscess (HCC)     Lumbar discitis, 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   Wound cx growing staph capitis.   ID consulted.  Abx de-escalated to cefazolin.   Not a good surgical candidate as per neuro surg.   Pt having severe low back and bilateral leg pain and has not been able to tolerate any position except laying nearly flat in the bed cont cefazolin, for 6 weeks, End Date: 12/02/22 pain management per orders -- palliative care following peripherally 5/3: titrating up on fentanyl patch 12.5 --> 25 mcg - pt reports pain is improved 5/6, will trial up in chair    Confusion and agitation - improved Hospital delirium  Pt was agitated, and also not responding to questions or commands during early hospitalization.  Mental status did start to improve, and  now pt is calm and interactive, however, at this time she has been having waxing and waning mental status Delirium precautions cont seroquel 50 mg nightly to help with delirium and sleep.   ESRD: on HD MWF.  Pt refused HD on 10/17/22, 10/18/22 and 3/27, 4/1.   When attempts made to do HD in chair, she did not tolerate on account of severe back pain Recommend consideration for home hemodialysis if no facility capable to  do HD in the bed   Generalized weakness / Physical Debility:  PT recs SNF. TOC following. Pt is bedridden and very high risk for developing pressure injuries of skin -- reposition frequently.   Monitor pressures areas on skin and offload as possible.   Severe back and bilateral leg pain due to severe multi-level lumbar degenerative disc disease. Pt has bulging discs at L2-3, L3-4, L4-5 and L5-S1. Pt will continue to have back and leg pain once antibiotics are complete. Pain mgmt per orders  Pain control complicated by AMS and hypotension with escalation - titrating up on fentanyl patch slowly, cautious use of IV fentanyl for breakthrough pain    ACD:  likely secondary ESRD. H&H are stable  Monitor periodic CBC   Hypokalemia Replace as needed Monitor BMP   Peripheral neuropathy --  Continue low dose gabapentin and monitor   Hx of seizures:  continue on home dose of Keppra   HLD:  continue on statin    Hx of COPD: w/o exacerbation.  Continue on bronchodilators    DM2: well controlled HbA1c 5.2.  d/c'ed BG checks    HTN:  Antihypertensives as blood pressure allows   GERD:  continue on PPI    Depression: severity unknown.  Continue on home dose of sertraline     Overweight: BMI 28.2.  Wt loss efforts recommended   Right foot pain  see peripheral neuropathy     DVT prophylaxis: heparin sq     Code Status: DNR    Current Admission Status: inpatient  TOC needs / Dispo plan: SNF Barriers to discharge / significant pending items: unable to sit to toelrate outpatient HD, working w/ PT    Laboratory data: I reviewed patient's labs showing sodium 133 potassium 3.8  Family Communication: No family at bedside today, I called the patient's daughter [name] at the number listed in patient's chart however she was unable to answer and so I left a voice message.   Physical Exam Constitutional:      General: She is not in acute distress.    Appearance: She is obese.   Cardiovascular:     Rate and Rhythm: Normal rate and regular rhythm.  Pulmonary:     Effort: Pulmonary effort is normal. No respiratory distress.     Breath sounds: Normal breath sounds.  Neurological:     General: Appears generally lethargic with some weakness noted to bilateral lower extremities.  Right greater than left    Mental Status: She is alert. Mental status is at baseline.     Time spent: 40 minutes   Vitals:   11/30/22 1617 11/30/22 2032 12/01/22 0614 12/01/22 0804  BP: (!) 148/76 130/70 (!) 142/69 (!) 107/56  Pulse: 86 89 79 75  Resp: 16 18 18 18   Temp: 98.2 F (36.8 C) 98.3 F (36.8 C) 98 F (36.7 C) 97.7 F (36.5 C)  TempSrc: Oral     SpO2: 94% 99% 92% 98%  Weight:      Height:  Author: Loyce Dys, MD 12/01/2022 1:21 PM  For on call review [email or web address].

## 2022-12-02 DIAGNOSIS — M4646 Discitis, unspecified, lumbar region: Secondary | ICD-10-CM | POA: Diagnosis not present

## 2022-12-02 LAB — BASIC METABOLIC PANEL
Anion gap: 8 (ref 5–15)
BUN: 38 mg/dL — ABNORMAL HIGH (ref 8–23)
CO2: 29 mmol/L (ref 22–32)
Calcium: 9.7 mg/dL (ref 8.9–10.3)
Chloride: 96 mmol/L — ABNORMAL LOW (ref 98–111)
Creatinine, Ser: 5.58 mg/dL — ABNORMAL HIGH (ref 0.44–1.00)
GFR, Estimated: 8 mL/min — ABNORMAL LOW (ref >60)
Glucose, Bld: 111 mg/dL — ABNORMAL HIGH (ref 70–99)
Potassium: 3.8 mmol/L (ref 3.5–5.1)
Sodium: 133 mmol/L — ABNORMAL LOW (ref 135–145)

## 2022-12-02 LAB — CBC
HCT: 28.7 % — ABNORMAL LOW (ref 36.0–46.0)
Hemoglobin: 8.4 g/dL — ABNORMAL LOW (ref 12.0–15.0)
MCH: 24.6 pg — ABNORMAL LOW (ref 26.0–34.0)
MCHC: 29.3 g/dL — ABNORMAL LOW (ref 30.0–36.0)
MCV: 84.2 fL (ref 80.0–100.0)
Platelets: 219 10*3/uL (ref 150–400)
RBC: 3.41 MIL/uL — ABNORMAL LOW (ref 3.87–5.11)
RDW: 19.6 % — ABNORMAL HIGH (ref 11.5–15.5)
WBC: 6 10*3/uL (ref 4.0–10.5)
nRBC: 0 % (ref 0.0–0.2)

## 2022-12-02 MED ORDER — EPOETIN ALFA 4000 UNIT/ML IJ SOLN
INTRAMUSCULAR | Status: AC
  Filled 2022-12-02: qty 1

## 2022-12-02 MED ORDER — HEPARIN SODIUM (PORCINE) 1000 UNIT/ML DIALYSIS
25.0000 [IU]/kg | INTRAMUSCULAR | Status: DC | PRN
  Administered 2022-12-02: 2300 [IU] via INTRAVENOUS_CENTRAL

## 2022-12-02 MED ORDER — HEPARIN SODIUM (PORCINE) 1000 UNIT/ML DIALYSIS: 1000.0000 [IU] | INTRAMUSCULAR | Status: DC | PRN

## 2022-12-02 MED ORDER — HEPARIN SODIUM (PORCINE) 1000 UNIT/ML IJ SOLN
INTRAMUSCULAR | Status: AC
  Filled 2022-12-02: qty 10

## 2022-12-02 NOTE — Progress Notes (Signed)
Progress Note   [name] ZOX:[id number] DOB: [birth year] DOA: 10/12/2022     51 DOS: the patient was seen and examined on 12/02/2022    Subjective:   Patient seen and examined at bedside She mentioned to me as well as nephrology team that she does not want to continue dialysis again. I mentioned to her to discuss this with her daughter. Patient was tearful this morning while undergoing dialysis and She asked me to call her daughter on her behalf. I called the patient's daughter however it went straight to voice message and I left a voice message again today. She did not have any complaints of pain today Denies chest pain nausea vomiting or abdominal pain     Brief Narrative / Hospital Course:  Cynthia Dean is a 72 y.o. 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 She presents to the ED with worsening lower extremity pain. Admitted for possible Discitis/osteomyelitis at L4-L5: w/ suspected intradiscal abscess as per MRI. 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    4/18, HD access / fistula clotted off during dialysis. 4/19 pt taken for procedure to address fistula malfunction, but was agitated and combative, resfused procedure. 4/19 vascular placed R femoral temp HD cath placed, resumed HD and pemcath placed 4/24   Patient has had waxing and waning mental status with intermittently refusing dialysis. Patient's family have decided to continue hemodialysis after speaking with their PCP. Hospital course prolonged, complicated by patient's inability to tolerate sitting in recliner chair for dialysis.  She only agrees to dialysis in the bed.  This is a barrier to discharge as no outpatient dialysis centers can accommodate.       5/3: titrating up on fentanyl patch 12.5 --> 25 mcg, seems  to be helping, if still unable to sit up in chair may consider increasing dose further      Consultants:  Neurosurgery Infectious Disease Vascular surgery  Nephrology    Procedures: 09/22/22: Stent placement to the AV graft  10/13/22: CT aspiration of right psoas muscle abscess yielding 2 ml purulent material  11/11/22: R Femoral V catheter placement 11/16/22: Permcath placement to L IJ        ASSESSMENT & PLAN:   Principal Problem:   Back pain Active Problems:   Anemia   Bilateral leg pain   ESRD on dialysis [facility])   Essential hypertension   Dyslipidemia   Seizure disorder (HCC)   Diabetic retinopathy without macular edema associated with type 2 diabetes mellitus (HCC)   GERD (gastroesophageal reflux disease)   Diabetic neuropathy (HCC)   Anemia of chronic disease   Insulin-requiring or dependent type II diabetes mellitus (HCC)   Discitis of lumbar region   Myositis   Psoas abscess (HCC)     Lumbar discitis, 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   Wound cx growing staph capitis.   ID consulted.  Abx de-escalated to cefazolin.   Not a good surgical candidate as per neuro surg.   Pt having severe low back and bilateral leg pain and has not been able to tolerate any position except laying nearly flat in the bed cont cefazolin, for 6 weeks, End Date: 12/02/22 pain management per orders -- palliative  care following peripherally 5/3: titrating up on fentanyl patch 12.5 --> 25 mcg - pt reports pain is improved 5/6, will trial up in chair as tolerated We will continue discussion of goals of care with patient's family if they are able to return my call   Confusion and agitation - improved Hospital delirium  Pt was agitated, and also not responding to questions or commands during early hospitalization.  Mental status did  start to improve, and now pt is calm and interactive, however, at this time she has been having waxing and waning mental status Delirium precautions cont seroquel 50 mg nightly to help with delirium and sleep.   ESRD: on HD MWF.  Pt refused HD on 10/17/22, 10/18/22 and 3/27, 4/1.   When attempts made to do HD in chair, she did not tolerate on account of severe back pain Recommend consideration for home hemodialysis if no facility capable to do HD in the bed   Generalized weakness / Physical Debility:  PT recs SNF. TOC following. Pt is bedridden and very high risk for developing pressure injuries of skin -- reposition frequently.   Monitor pressures areas on skin and offload as possible.   Severe back and bilateral leg pain due to severe multi-level lumbar degenerative disc disease. Pt has bulging discs at L2-3, L3-4, L4-5 and L5-S1. Pt will continue to have back and leg pain once antibiotics are complete. Pain mgmt per orders  Pain control complicated by AMS and hypotension with escalation - titrating up on fentanyl patch slowly, cautious use of IV fentanyl for breakthrough pain    ACD:  likely secondary ESRD. H&H are stable  Monitor periodic CBC   Hypokalemia Replace as needed Monitor BMP   Peripheral neuropathy --  Continue low dose gabapentin and monitor   Hx of seizures:  continue on home dose of Keppra   HLD:  continue on statin    Hx of COPD: w/o exacerbation.  Continue on bronchodilators    DM2: well controlled HbA1c 5.2.  d/c'ed BG checks    HTN:  Antihypertensives as blood pressure allows   GERD:  continue on PPI    Depression: severity unknown.  Continue on home dose of sertraline     Overweight: BMI 28.2.  Wt loss efforts recommended   Right foot pain  see peripheral neuropathy     DVT prophylaxis: heparin sq     Code Status: DNR     Current Admission Status: inpatient  TOC needs / Dispo plan: SNF Barriers to discharge / significant pending  items: unable to sit to toelrate outpatient HD, working w/ PT    Laboratory data: I reviewed patient's labs showing sodium sodium 133 potassium 3.8 chloride 9.6 glucose 111 creatinine 5.58   Family Communication: No family at bedside today, again today I called the patient's daughter [name] at the number listed in patient's chart however she was unable to answer and so I left a voice message.   Physical Exam Constitutional:      General: She is not in acute distress.    Appearance: She is obese.  Cardiovascular:     Rate and Rhythm: Normal rate and regular rhythm.  Pulmonary:     Effort: Pulmonary effort is normal. No respiratory distress.     Breath sounds: Normal breath sounds.  Neurological:     General: Appears generally lethargic with some weakness noted to bilateral lower extremities.  Right greater than left    Mental Status: She is alert. Mental status is  at baseline.     Time spent: 42 minutes        Vitals:   12/02/22 1130 12/02/22 1200 12/02/22 1230 12/02/22 1240  BP: (!) 132/57 118/61 105/66 (!) 101/55  Pulse: 76 77 81 84  Resp: 13 15 16 18   Temp:    98.2 F (36.8 C)  TempSrc:    Oral  SpO2: 93% 91% 92% 93%  Weight:    91.3 kg  Height:        Author: Loyce Dys, MD 12/02/2022 2:53 PM  For on call review [email or web address].

## 2022-12-02 NOTE — Progress Notes (Signed)
[facility]  ROUNDING NOTE   Subjective:   Cynthia Dean is a 72 y.o. 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 She presents to the ED with worsening lower extremity pain. Patient will be admitted for Back pain [M54.9] Bilateral leg pain [M79.604, M79.605]  Patient seen and evaluated during dialysis   HEMODIALYSIS FLOWSHEET:  Blood Flow Rate (mL/min): 400 mL/min Arterial Pressure (mmHg): -180 mmHg Venous Pressure (mmHg): 180 mmHg TMP (mmHg): -3 mmHg Ultrafiltration Rate (mL/min): 257 mL/min Dialysate Flow Rate (mL/min): 300 ml/min Dialysis Fluid Bolus: Normal Saline Bolus Amount (mL): 500 mL  Tolerating treatment well in bed   Objective:  Vital signs in last 24 hours:  Temp:  [97.6 F (36.4 C)-98.9 F (37.2 C)] 97.6 F (36.4 C) (05/10 0834) Pulse Rate:  [71-87] 73 (05/10 1030) Resp:  [12-22] 13 (05/10 1030) BP: (122-147)/(53-80) 137/63 (05/10 1030) SpO2:  [90 %-100 %] 95 % (05/10 1030) Weight:  [91.3 kg] 91.3 kg (05/10 0807)  Weight change:  Filed Weights   11/30/22 0808 11/30/22 1223 12/02/22 0807  Weight: 90.7 kg 91.5 kg 91.3 kg    Intake/Output: No intake/output data recorded.   Intake/Output this shift:  No intake/output data recorded.  Physical Exam: General: NAD  Head: Normocephalic, atraumatic.  Moist oral mucosal membranes  Eyes: Anicteric  Lungs:  Clear to auscultation  Heart: Regular rate and rhythm  Abdomen:  Soft, nontender  Extremities: No peripheral edema.  Neurologic: Alert, moving all extremities   Skin: No lesions  Access: Left AVG (no bruit/thrill)), left chest PermCath    Basic Metabolic Panel: Recent Labs  Lab 11/25/22 1224 11/28/22 0759 11/30/22 0837 12/01/22 0542 12/02/22 0551  NA 136 138 136 133* 133*  K 3.8 3.7 3.7 3.8 3.8  CL 98 98 99 95* 96*  CO2 27 26 28 28 29    GLUCOSE 145* 122* 144* 82 111*  BUN 34* 41* 40* 20 38*  CREATININE 6.75* 7.67* 6.28* 3.89* 5.58*  CALCIUM 10.1 10.5* 10.2 9.7 9.7  PHOS 5.7* 5.7* 5.3*  --   --      Liver Function Tests: Recent Labs  Lab 11/25/22 1224 11/28/22 0759 11/30/22 0837  ALBUMIN 2.5* 2.6* 2.4*    No results for input(s): "LIPASE", "AMYLASE" in the last 168 hours. No results for input(s): "AMMONIA" in the last 168 hours.  CBC: Recent Labs  Lab 11/25/22 1224 11/28/22 0759 11/30/22 0837 12/02/22 0843  WBC 7.4 7.7 6.8 6.0  HGB 9.6* 9.0* 8.8* 8.4*  HCT 32.2* 31.0* 29.4* 28.7*  MCV 84.5 84.5 84.2 84.2  PLT 230 222 192 219     Cardiac Enzymes: No results for input(s): "CKTOTAL", "CKMB", "CKMBINDEX", "TROPONINI" in the last 168 hours.  BNP: Invalid input(s): "POCBNP"  CBG: No results for input(s): "GLUCAP" in the last 168 hours.   Microbiology: Results for orders placed or performed during the hospital encounter of 10/12/22  Blood culture (routine x 2)     Status: None   Collection Time: 10/12/22  5:40 PM   Specimen: BLOOD  Result Value Ref Range Status   Specimen Description BLOOD BLOOD RIGHT HAND [facility]  Final   Special Requests   Final    BOTTLES DRAWN AEROBIC AND ANAEROBIC Blood Culture results may not be optimal due to an inadequate volume of blood received in culture bottles   Culture   Final    NO GROWTH 5 DAYS Performed  at [facility], [address] [place], [place] [address]    Report Status 10/17/2022 FINAL  Final  Blood culture (routine x 2)     Status: None   Collection Time: 10/12/22  6:56 PM   Specimen: BLOOD  Result Value Ref Range Status   Specimen Description BLOOD BLOOD RIGHT FOREARM  Final   Special Requests   Final    BOTTLES DRAWN AEROBIC ONLY Blood Culture results may not be optimal due to an inadequate volume of blood received in culture bottles   Culture   Final    NO GROWTH 5 DAYS Performed at [facility], [address]  [place], [place] [address]    Report Status 10/17/2022 FINAL  Final  Aerobic/Anaerobic Culture w Gram Stain (surgical/deep wound)     Status: None   Collection Time: 10/13/22 11:44 AM   Specimen: Abscess  Result Value Ref Range Status   Specimen Description   Final    ABSCESS Performed at [facility], [address], [place], [place] [address]    Special Requests   Final    MUSCLE ABSCESS Performed at [facility], [address] [place], [place] [address]    Gram Stain   Final    ABUNDANT WBC PRESENT, PREDOMINANTLY PMN NO ORGANISMS SEEN    Culture   Final    FEW STAPHYLOCOCCUS CAPITIS NO ANAEROBES ISOLATED Performed at [facility], [address], [place], [place] [address]    Report Status 10/18/2022 FINAL  Final   Organism ID, Bacteria STAPHYLOCOCCUS CAPITIS  Final      Susceptibility   Staphylococcus capitis - MIC*    CIPROFLOXACIN <=0.5 SENSITIVE Sensitive     ERYTHROMYCIN >=8 RESISTANT Resistant     GENTAMICIN <=0.5 SENSITIVE Sensitive     OXACILLIN <=0.25 SENSITIVE Sensitive     TETRACYCLINE <=1 SENSITIVE Sensitive     VANCOMYCIN 1 SENSITIVE Sensitive     TRIMETH/SULFA <=10 SENSITIVE Sensitive     CLINDAMYCIN INTERMEDIATE Intermediate     RIFAMPIN <=0.5 SENSITIVE Sensitive     Inducible Clindamycin NEGATIVE Sensitive     * FEW STAPHYLOCOCCUS CAPITIS    Coagulation Studies: No results for input(s): "LABPROT", "INR" in the last 72 hours.  Urinalysis: No results for input(s): "COLORURINE", "LABSPEC", "PHURINE", "GLUCOSEU", "HGBUR", "BILIRUBINUR", "KETONESUR", "PROTEINUR", "UROBILINOGEN", "NITRITE", "LEUKOCYTESUR" in the last 72 hours.  Invalid input(s): "APPERANCEUR"    Imaging: No results found.   Medications:    sodium chloride Stopped (11/16/22 1155)   anticoagulant sodium citrate      ceFAZolin (ANCEF) IV Stopped (11/25/22 1534)    (feeding supplement) PROSource Plus  30 mL Oral BID BM   Chlorhexidine Gluconate Cloth  6 each  Topical Q0600   cinacalcet  30 mg Oral Q supper   epoetin (EPOGEN/PROCRIT) injection  4,000 Units Intravenous Q M,W,F-HD   feeding supplement (NEPRO CARB STEADY)  237 mL Oral TID BM   fentaNYL  1 patch Transdermal Q72H   gabapentin  100 mg Oral Once per day on Mon Wed Fri   Gerhardt's butt cream   Topical QID   heparin injection (subcutaneous)  5,000 Units Subcutaneous Q8H   levETIRAcetam  1,000 mg Oral Daily   levETIRAcetam  250 mg Oral Q M,W,F   metoprolol succinate  50 mg Oral Daily   pantoprazole  20 mg Oral BID   psyllium  1 packet Oral Daily   QUEtiapine  25 mg Oral QHS   sertraline  25 mg  Oral Daily   sodium chloride flush  10-40 mL Intracatheter Q12H   umeclidinium-vilanterol  1 puff Inhalation Daily   alteplase, anticoagulant sodium citrate, fentaNYL (SUBLIMAZE) injection, guaiFENesin, hydrALAZINE, lidocaine (PF), lidocaine-prilocaine, LORazepam, ondansetron, phenol, senna-docusate, sodium chloride flush  Assessment/ Plan:  Ms. Amba Limones is a 72 y.o.  female with end stage renal disease on hemodialysis, coronary artery disease, COPD with intermittent home O2, hypertension, hyperlipidemia, stroke, diabetes mellitus type II, and breast cancer who was admitted to [facility] on 10/12/2022 for Back pain [M54.9] Bilateral leg pain [M79.604, M79.605]  Patient was found to have vertebral osteomyelitis and psoas abscess. Cultures positive for staph capitis  [facility] MWF Left AVG 102.5kg  End-stage renal disease on hemodialysis. on MWF schedule.     Receiving dialysis, UF 0.  Next treatment scheduled for Monday.  Patient receiving last scheduled dose of during treatment today.  Patient still needs to tolerate sitting upright for discharge planning.  If unable to tolerate sitting up, will recommend reinitiating palliative conversations.    Anemia of chronic kidney disease Normocytic Lab Results  Component Value Date   HGB 8.4 (L) 12/02/2022  Patient receives  Mircera at outpatient clinic.  Continue EPO with dialysis treatments.  Secondary Hyperparathyroidism: with outpatient labs: PTH 180 (3/11), phosphorus 5.4, calcium 9.3   Lab Results  Component Value Date   PTH 131 (H) 03/14/2018   CALCIUM 9.7 12/02/2022   CAION 1.09 (L) 08/28/2022   PHOS 5.3 (H) 11/30/2022   -Continue Cinacalcet. -Calcium and phosphorus at desired range.  Hypotension during hemodialysis treatment.  Blood pressure 137/63 during dialysis.  Diabetes mellitus type II with chronic kidney disease/renal manifestations: insulin dependent, NPH.    Vertebral osteomyelitis, seen on CT lumber spine. Neurosurgery feels patient is not a surgical candidate CT guided aspiration of psoas abscess on 10/13/22. Cultures positive for Staph Capitis.   -Infectious disease: Continue cefazolin with dialysis, until 12/02/22.    LOS: 51   5/10/202410:35 AM

## 2022-12-02 NOTE — Progress Notes (Signed)
Hemodialysis note  Received patient in bed to unit. Alert and oriented to self. Informed consent signed and in chart.  Treatment initiated: 0847 Treatment completed: 1230  Patient tolerated well. Transported back to room, alert without acute distress.  Report given to patient's RN.   Access used: Left Chest HD PermCath Access issues: none  Total UF removed: 0 Medication(s) given:  Cefazolin 3g IV, Epogen 4000 units  Post HD weight: 91.3 kg   [facility]

## 2022-12-03 DIAGNOSIS — M4646 Discitis, unspecified, lumbar region: Secondary | ICD-10-CM | POA: Diagnosis not present

## 2022-12-03 LAB — BASIC METABOLIC PANEL
Anion gap: 10 (ref 5–15)
BUN: 20 mg/dL (ref 8–23)
CO2: 28 mmol/L (ref 22–32)
Calcium: 9.2 mg/dL (ref 8.9–10.3)
Chloride: 95 mmol/L — ABNORMAL LOW (ref 98–111)
Creatinine, Ser: 3.62 mg/dL — ABNORMAL HIGH (ref 0.44–1.00)
GFR, Estimated: 13 mL/min — ABNORMAL LOW (ref >60)
Glucose, Bld: 116 mg/dL — ABNORMAL HIGH (ref 70–99)
Potassium: 3.2 mmol/L — ABNORMAL LOW (ref 3.5–5.1)
Sodium: 133 mmol/L — ABNORMAL LOW (ref 135–145)

## 2022-12-03 MED ORDER — POTASSIUM CHLORIDE CRYS ER 20 MEQ PO TBCR
40.0000 meq | EXTENDED_RELEASE_TABLET | Freq: Once | ORAL | Status: AC
  Administered 2022-12-03: 40 meq via ORAL
  Filled 2022-12-03: qty 2

## 2022-12-03 NOTE — Progress Notes (Signed)
[facility]  PROGRESS NOTE   Subjective:   Seen at bedside feels better.  Objective:  Vital signs: Blood pressure 137/72, pulse 88, temperature 98.4 F (36.9 C), temperature source Oral, resp. rate 18, height 6' (1.829 m), weight 91.3 kg, SpO2 92 %. No intake or output data in the 24 hours ending 12/03/22 1417 Filed Weights   11/30/22 1223 12/02/22 0807 12/02/22 1240  Weight: 91.5 kg 91.3 kg 91.3 kg     Physical Exam: General:  No acute distress  Head:  Normocephalic, atraumatic. Moist oral mucosal membranes  Eyes:  Anicteric  Neck:  Supple  Lungs:   Clear to auscultation, normal effort  Heart:  S1S2 no rubs  Abdomen:   Soft, nontender, bowel sounds present  Extremities:  peripheral edema.  Neurologic:  Awake, alert, following commands  Skin:  No lesions  Access:     Basic Metabolic Panel: Recent Labs  Lab 11/28/22 0759 11/30/22 0837 12/01/22 0542 12/02/22 0551 12/03/22 0550  NA 138 136 133* 133* 133*  K 3.7 3.7 3.8 3.8 3.2*  CL 98 99 95* 96* 95*  CO2 26 28 28 29 28   GLUCOSE 122* 144* 82 111* 116*  BUN 41* 40* 20 38* 20  CREATININE 7.67* 6.28* 3.89* 5.58* 3.62*  CALCIUM 10.5* 10.2 9.7 9.7 9.2  PHOS 5.7* 5.3*  --   --   --    GFR: Estimated Creatinine Clearance: 18.1 mL/min (A) (by C-G formula based on SCr of 3.62 mg/dL (H)).  Liver Function Tests: Recent Labs  Lab 11/28/22 0759 11/30/22 0837  ALBUMIN 2.6* 2.4*   No results for input(s): "LIPASE", "AMYLASE" in the last 168 hours. No results for input(s): "AMMONIA" in the last 168 hours.  CBC: Recent Labs  Lab 11/28/22 0759 11/30/22 0837 12/02/22 0843  WBC 7.7 6.8 6.0  HGB 9.0* 8.8* 8.4*  HCT 31.0* 29.4* 28.7*  MCV 84.5 84.2 84.2  PLT 222 192 219     HbA1C: Hemoglobin A1C  Date/Time Value Ref Range Status  06/15/2014 12:58 AM 7.9 (H) 4.2 - 6.3 % Final    Comment:    The American Diabetes Association recommends that a primary goal of therapy should be <7% and that  physicians should reevaluate the treatment regimen in patients with HbA1c values consistently >8%.   12/24/2013 11:18 AM 8.2 (H) 4.2 - 6.3 % Final    Comment:    The American Diabetes Association recommends that a primary goal of therapy should be <7% and that physicians should reevaluate the treatment regimen in patients with HbA1c values consistently >8%.    Hgb A1c MFr Bld  Date/Time Value Ref Range Status  10/13/2022 11:46 AM 5.2 4.8 - 5.6 % Final    Comment:    (NOTE)         Prediabetes: 5.7 - 6.4         Diabetes: >6.4         Glycemic control for adults with diabetes: <7.0   03/23/2022 06:47 AM 5.8 (H) 4.8 - 5.6 % Final    Comment:    (NOTE) Pre diabetes:          5.7%-6.4%  Diabetes:              >6.4%  Glycemic control for   <7.0% adults with diabetes     Urinalysis: No results for input(s): "COLORURINE", "LABSPEC", "PHURINE", "GLUCOSEU", "HGBUR", "BILIRUBINUR", "KETONESUR", "PROTEINUR", "UROBILINOGEN", "NITRITE", "LEUKOCYTESUR" in the last 72 hours.  Invalid input(s): "APPERANCEUR"  Imaging: No results found.   Medications:    sodium chloride Stopped (11/16/22 1155)   anticoagulant sodium citrate      (feeding supplement) PROSource Plus  30 mL Oral BID BM   Chlorhexidine Gluconate Cloth  6 each Topical Q0600   cinacalcet  30 mg Oral Q supper   epoetin (EPOGEN/PROCRIT) injection  4,000 Units Intravenous Q M,W,F-HD   feeding supplement (NEPRO CARB STEADY)  237 mL Oral TID BM   fentaNYL  1 patch Transdermal Q72H   gabapentin  100 mg Oral Once per day on Mon Wed Fri   Gerhardt's butt cream   Topical QID   heparin injection (subcutaneous)  5,000 Units Subcutaneous Q8H   levETIRAcetam  1,000 mg Oral Daily   levETIRAcetam  250 mg Oral Q M,W,F   metoprolol succinate  50 mg Oral Daily   pantoprazole  20 mg Oral BID   psyllium  1 packet Oral Daily   QUEtiapine  25 mg Oral QHS   sertraline  25 mg Oral Daily   sodium chloride flush  10-40 mL  Intracatheter Q12H   umeclidinium-vilanterol  1 puff Inhalation Daily    Assessment/ Plan:      72 y.o. 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 She presents to the ED with worsening lower extremity pain. Patient will be admitted for Back pain [M54.9] Bilateral leg pain [M79.604, M79.605]  #1 ESRD on hemodialysis.  Had stable dialysis on Friday.  #2: Anemia: Continue Epogen and iron intravenously at dialysis.  #3: Second hyperparathyroidism: Will continue Sensipar and binders as needed.  #4: Vertebral osteomyelitis: Continue cefazolin with dialysis.  ID on the case.  Neurosurgery is also on the case  Will follow.    LOS: 52 Lorain Childes, MD [facility] 5/11/20242:17 PM

## 2022-12-03 NOTE — Progress Notes (Signed)
Progress Note   [name] ZOX:[id number] DOB: [birth year] DOA: 10/12/2022     52 DOS: the patient was seen and examined on 12/03/2022     Subjective:   Patient seen and examined at bedside She did not have any complaints of pain today Attempted to be made to reach patient's family for discussion of goals of care Denies chest pain nausea vomiting or abdominal pain     Brief Narrative / Hospital Course:  Cynthia Dean is a 72 y.o. 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 She presents to the ED with worsening lower extremity pain. Admitted for possible Discitis/osteomyelitis at L4-L5: w/ suspected intradiscal abscess as per MRI. 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    4/18, HD access / fistula clotted off during dialysis. 4/19 pt taken for procedure to address fistula malfunction, but was agitated and combative, resfused procedure. 4/19 vascular placed R femoral temp HD cath placed, resumed HD and pemcath placed 4/24   Patient has had waxing and waning mental status with intermittently refusing dialysis. Patient's family have decided to continue hemodialysis after speaking with their PCP. Hospital course prolonged, complicated by patient's inability to tolerate sitting in recliner chair for dialysis.  She only agrees to dialysis in the bed.  This is a barrier to discharge as no outpatient dialysis centers can accommodate.       5/3: titrating up on fentanyl patch 12.5 --> 25 mcg, seems to be helping, if still unable to sit up in chair may consider increasing dose further      Consultants:  Neurosurgery Infectious Disease Vascular surgery  Nephrology    Procedures: 09/22/22: Stent placement to the AV graft  10/13/22: CT aspiration of right psoas muscle abscess yielding 2  ml purulent material  11/11/22: R Femoral V catheter placement 11/16/22: Permcath placement to L IJ        ASSESSMENT & PLAN:   Principal Problem:   Back pain Active Problems:   Anemia   Bilateral leg pain   ESRD on dialysis [facility])   Essential hypertension   Dyslipidemia   Seizure disorder (HCC)   Diabetic retinopathy without macular edema associated with type 2 diabetes mellitus (HCC)   GERD (gastroesophageal reflux disease)   Diabetic neuropathy (HCC)   Anemia of chronic disease   Insulin-requiring or dependent type II diabetes mellitus (HCC)   Discitis of lumbar region   Myositis   Psoas abscess (HCC)     Lumbar discitis, 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   Wound cx growing staph capitis.   ID consulted.  Abx de-escalated to cefazolin.   Not a good surgical candidate as per neuro surg.   Pt having severe low back and bilateral leg pain and has not been able to tolerate any position except laying nearly flat in the bed Has completed 6 weeks of cefazolin,  End Date: 12/02/22 pain management per orders -- palliative care following peripherally 5/3: titrating up on fentanyl patch 12.5 --> 25 mcg - pt reports pain is improved 5/6, will trial up in chair as tolerated We will continue discussion of goals of care with patient's family if they are able to return my call   Confusion and  agitation - improved Hospital delirium  Pt was agitated, and also not responding to questions or commands during early hospitalization.  Mental status did start to improve, and now pt is calm and interactive, however, at this time she has been having waxing and waning mental status Delirium precautions cont seroquel 50 mg nightly to help with delirium and sleep.   ESRD: on HD MWF.  Pt refused HD on 10/17/22, 10/18/22 and 3/27, 4/1.   When  attempts made to do HD in chair, she did not tolerate on account of severe back pain Recommend consideration for home hemodialysis if no facility capable to do HD in the bed. or discharge to dialysis capable skilled nursing facility   Generalized weakness / Physical Debility:  PT recs SNF. TOC following. Pt is bedridden and very high risk for developing pressure injuries of skin -- reposition frequently.   Monitor pressures areas on skin and offload as possible.   Severe back and bilateral leg pain due to severe multi-level lumbar degenerative disc disease. Pt has bulging discs at L2-3, L3-4, L4-5 and L5-S1. Pt will continue to have back and leg pain once antibiotics are complete. Pain mgmt per orders  Pain control complicated by AMS and hypotension with escalation - titrating up on fentanyl patch slowly, cautious use of IV fentanyl for breakthrough pain    ACD:  likely secondary ESRD. H&H are stable  Monitor periodic CBC   Hypokalemia-improved Replace as needed Monitor BMP   Peripheral neuropathy --  Continue low dose gabapentin and monitor   Hx of seizures:  continue on home dose of Keppra   HLD:  continue on statin    Hx of COPD: w/o exacerbation.  Continue on bronchodilators    DM2: well controlled HbA1c 5.2.  d/c'ed BG checks    HTN:  Antihypertensives as blood pressure allows   GERD:  continue on PPI    Depression: severity unknown.  Continue on home dose of sertraline     Overweight: BMI 28.2.  Wt loss efforts recommended   Right foot pain  see peripheral neuropathy     DVT prophylaxis: heparin sq     Code Status: DNR     Current Admission Status: inpatient  TOC needs / Dispo plan: SNF Barriers to discharge / significant pending items: unable to sit to toelrate outpatient HD, working w/ PT    Laboratory data: I reviewed patient's labs showing sodium sodium 133 potassium 3.2 creatinine 3.6  Family Communication: No family at bedside today,  again today I called the patient's daughter [name] at the number listed in patient's chart however she was unable to answer and so I left a voice message again.   Physical Exam Constitutional:      General: She is not in acute distress.    Appearance: She is obese.  Cardiovascular:     Rate and Rhythm: Normal rate and regular rhythm.  Pulmonary:     Effort: Pulmonary effort is normal. No respiratory distress.     Breath sounds: Normal breath sounds.  Neurological:     General: Appears generally lethargic with some weakness noted to bilateral lower extremities.  Right greater than left    Mental Status: She is alert. Mental status is at baseline.     Time spent: 40 minutes      Vitals:   12/02/22 1240 12/02/22 2005 12/03/22 0410 12/03/22 0735  BP: (!) 101/55 138/65 (!) 150/70 137/72  Pulse: 84 82 81 88  Resp: 18 16 20  18  Temp: 98.2 F (36.8 C) 98.2 F (36.8 C) 98.2 F (36.8 C) 98.4 F (36.9 C)  TempSrc: Oral Oral Oral Oral  SpO2: 93% 97% 95% 92%  Weight: 91.3 kg     Height:        Author: Loyce Dys, MD 12/03/2022 1:47 PM  For on call review [email or web address].

## 2022-12-04 DIAGNOSIS — M4646 Discitis, unspecified, lumbar region: Secondary | ICD-10-CM | POA: Diagnosis not present

## 2022-12-04 LAB — BASIC METABOLIC PANEL
Anion gap: 8 (ref 5–15)
BUN: 31 mg/dL — ABNORMAL HIGH (ref 8–23)
CO2: 28 mmol/L (ref 22–32)
Calcium: 10.1 mg/dL (ref 8.9–10.3)
Chloride: 98 mmol/L (ref 98–111)
Creatinine, Ser: 5.23 mg/dL — ABNORMAL HIGH (ref 0.44–1.00)
GFR, Estimated: 8 mL/min — ABNORMAL LOW (ref >60)
Glucose, Bld: 111 mg/dL — ABNORMAL HIGH (ref 70–99)
Potassium: 4 mmol/L (ref 3.5–5.1)
Sodium: 134 mmol/L — ABNORMAL LOW (ref 135–145)

## 2022-12-04 NOTE — Progress Notes (Signed)
Progress Note   [name] ZOX:[id number] DOB: 19-Nov-1950 DOA: 10/12/2022     53 DOS: the patient was seen and examined on 12/04/2022     Subjective:   Patient seen and examined at bedside Denies any acute complaints We will continue to make attempts to reach patient's family for goals of care. So far within the last 4 days daughter has not been answering my calls Denies chest pain nausea vomiting or abdominal pain     Brief Narrative / Hospital Course:  Cynthia Dean is a 72 y.o. 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 She presents to the ED with worsening lower extremity pain. Admitted for possible Discitis/osteomyelitis at L4-L5: w/ suspected intradiscal abscess as per MRI. 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    4/18, HD access / fistula clotted off during dialysis. 4/19 pt taken for procedure to address fistula malfunction, but was agitated and combative, resfused procedure. 4/19 vascular placed R femoral temp HD cath placed, resumed HD and pemcath placed 4/24   Patient has had waxing and waning mental status with intermittently refusing dialysis. Patient's family have decided to continue hemodialysis after speaking with their PCP. Hospital course prolonged, complicated by patient's inability to tolerate sitting in recliner chair for dialysis.  She only agrees to dialysis in the bed.  This is a barrier to discharge as no outpatient dialysis centers can accommodate.       5/3: titrating up on fentanyl patch 12.5 --> 25 mcg, seems to be helping, if still unable to sit up in chair may consider increasing dose further      Consultants:  Neurosurgery Infectious Disease Vascular surgery  Nephrology    Procedures: 09/22/22: Stent placement to the AV graft  10/13/22:  CT aspiration of right psoas muscle abscess yielding 2 ml purulent material  11/11/22: R Femoral V catheter placement 11/16/22: Permcath placement to L IJ        ASSESSMENT & PLAN:   Principal Problem:   Back pain Active Problems:   Anemia   Bilateral leg pain   ESRD on dialysis [facility])   Essential hypertension   Dyslipidemia   Seizure disorder (HCC)   Diabetic retinopathy without macular edema associated with type 2 diabetes mellitus (HCC)   GERD (gastroesophageal reflux disease)   Diabetic neuropathy (HCC)   Anemia of chronic disease   Insulin-requiring or dependent type II diabetes mellitus (HCC)   Discitis of lumbar region   Myositis   Psoas abscess (HCC)     Lumbar discitis, 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   Wound cx growing staph capitis.   ID consulted.  Abx de-escalated to cefazolin.   Not a good surgical candidate as per neuro surg.   Pt having severe low back and bilateral leg pain and has not been able to tolerate any position except laying nearly flat in the bed Has completed 6 weeks of cefazolin,  End Date: 12/02/22 pain management per orders -- palliative care following peripherally 5/3: titrating up on fentanyl patch 12.5 --> 25 mcg - pt reports pain is improved 5/6, will trial up in chair as tolerated We will continue discussion of goals of care with patient's family if they are  able to return my call   Confusion and agitation - improved Hospital delirium  Pt was agitated, and also not responding to questions or commands during early hospitalization.  Mental status did start to improve, and now pt is calm and interactive, however, at this time she has been having waxing and waning mental status Delirium precautions cont seroquel 50 mg nightly to help with delirium and sleep.   ESRD: on HD MWF.  Pt  refused HD on 10/17/22, 10/18/22 and 3/27, 4/1.   When attempts made to do HD in chair, she did not tolerate on account of severe back pain Recommend consideration for home hemodialysis if no facility capable to do HD in the bed. or discharge to dialysis capable skilled nursing facility   Generalized weakness / Physical Debility:  PT recs SNF. TOC following. Pt is bedridden and very high risk for developing pressure injuries of skin -- reposition frequently.   Monitor pressures areas on skin and offload as possible.   Severe back and bilateral leg pain due to severe multi-level lumbar degenerative disc disease. Pt has bulging discs at L2-3, L3-4, L4-5 and L5-S1. Pt will continue to have back and leg pain once antibiotics are complete. Pain mgmt per orders  Pain control complicated by AMS and hypotension with escalation - titrating up on fentanyl patch slowly, cautious use of IV fentanyl for breakthrough pain    ACD:  likely secondary ESRD. H&H are stable  Monitor periodic CBC   Hypokalemia-improved Replace as needed Monitor BMP   Peripheral neuropathy --  Continue low dose gabapentin and monitor   Hx of seizures:  continue on home dose of Keppra   HLD:  continue on statin    Hx of COPD: w/o exacerbation.  Continue on bronchodilators    DM2: well controlled HbA1c 5.2.  d/c'ed BG checks    HTN:  Antihypertensives as blood pressure allows   GERD:  continue on PPI    Depression: severity unknown.  Continue on home dose of sertraline     Overweight: BMI 28.2.  Wt loss efforts recommended   Right foot pain  see peripheral neuropathy     DVT prophylaxis: heparin sq     Code Status: DNR     Current Admission Status: inpatient  TOC needs / Dispo plan: SNF Barriers to discharge / significant pending items: unable to sit to toelrate outpatient HD, working w/ PT    Laboratory data: I reviewed patient's labs showing sodium sodium 134 potassium 4.0 creatinine  5.2  Family Communication: No family at bedside today, again today I called the patient's daughter [name] at the number listed in patient's chart however she was unable to answer and so I left a voice message again.   Physical Exam Constitutional:      General: She is not in acute distress.    Appearance: She is obese.  Cardiovascular:     Rate and Rhythm: Normal rate and regular rhythm.  Pulmonary:     Effort: Pulmonary effort is normal. No respiratory distress.     Breath sounds: Normal breath sounds.  Neurological:     General: Appears generally lethargic with some weakness noted to bilateral lower extremities.  Right greater than left    Mental Status: She is alert. Mental status is at baseline.     Time spent: 38 minutes      Vitals:   12/03/22 1607 12/03/22 2036 12/04/22 0605 12/04/22 0947  BP: (!) 167/82 (!) 168/77 (!) 153/81 (!) 176/86  Pulse: 91 83 87 84  Resp: 16 20 18 18   Temp: 98.4 F (36.9 C) 98.2 F (36.8 C) 97.7 F (36.5 C) 98 F (36.7 C)  TempSrc: Oral Oral Oral   SpO2: 91% 93% 100% 95%  Weight:      Height:        Author: Loyce Dys, MD 12/04/2022 3:58 PM  For on call review [email or web address].

## 2022-12-04 NOTE — Progress Notes (Signed)
[facility]  PROGRESS NOTE   Subjective:   Awake and alert.  Comfortable.  Blood pressure is marginally high today.  Objective:  Vital signs: Blood pressure (!) 176/86, pulse 84, temperature 98 F (36.7 C), resp. rate 18, height 6' (1.829 m), weight 91.3 kg, SpO2 95 %.  Intake/Output Summary (Last 24 hours) at 12/04/2022 1153 Last data filed at 12/03/2022 1855 Gross per 24 hour  Intake 480 ml  Output --  Net 480 ml   Filed Weights   11/30/22 1223 12/02/22 0807 12/02/22 1240  Weight: 91.5 kg 91.3 kg 91.3 kg     Physical Exam: General:  No acute distress  Head:  Normocephalic, atraumatic. Moist oral mucosal membranes  Eyes:  Anicteric  Neck:  Supple  Lungs:   Clear to auscultation, normal effort  Heart:  S1S2 no rubs  Abdomen:   Soft, nontender, bowel sounds present  Extremities:  peripheral edema.  Neurologic:  Awake, alert, following commands  Skin:  No lesions  Access:     Basic Metabolic Panel: Recent Labs  Lab 11/28/22 0759 11/30/22 0837 12/01/22 0542 12/02/22 0551 12/03/22 0550 12/04/22 0618  NA 138 136 133* 133* 133* 134*  K 3.7 3.7 3.8 3.8 3.2* 4.0  CL 98 99 95* 96* 95* 98  CO2 26 28 28 29 28 28   GLUCOSE 122* 144* 82 111* 116* 111*  BUN 41* 40* 20 38* 20 31*  CREATININE 7.67* 6.28* 3.89* 5.58* 3.62* 5.23*  CALCIUM 10.5* 10.2 9.7 9.7 9.2 10.1  PHOS 5.7* 5.3*  --   --   --   --    GFR: Estimated Creatinine Clearance: 12.5 mL/min (A) (by C-G formula based on SCr of 5.23 mg/dL (H)).  Liver Function Tests: Recent Labs  Lab 11/28/22 0759 11/30/22 0837  ALBUMIN 2.6* 2.4*   No results for input(s): "LIPASE", "AMYLASE" in the last 168 hours. No results for input(s): "AMMONIA" in the last 168 hours.  CBC: Recent Labs  Lab 11/28/22 0759 11/30/22 0837 12/02/22 0843  WBC 7.7 6.8 6.0  HGB 9.0* 8.8* 8.4*  HCT 31.0* 29.4* 28.7*  MCV 84.5 84.2 84.2  PLT 222 192 219     HbA1C: Hemoglobin A1C  Date/Time Value Ref Range Status   06/15/2014 12:58 AM 7.9 (H) 4.2 - 6.3 % Final    Comment:    The American Diabetes Association recommends that a primary goal of therapy should be <7% and that physicians should reevaluate the treatment regimen in patients with HbA1c values consistently >8%.   12/24/2013 11:18 AM 8.2 (H) 4.2 - 6.3 % Final    Comment:    The American Diabetes Association recommends that a primary goal of therapy should be <7% and that physicians should reevaluate the treatment regimen in patients with HbA1c values consistently >8%.    Hgb A1c MFr Bld  Date/Time Value Ref Range Status  10/13/2022 11:46 AM 5.2 4.8 - 5.6 % Final    Comment:    (NOTE)         Prediabetes: 5.7 - 6.4         Diabetes: >6.4         Glycemic control for adults with diabetes: <7.0   03/23/2022 06:47 AM 5.8 (H) 4.8 - 5.6 % Final    Comment:    (NOTE) Pre diabetes:          5.7%-6.4%  Diabetes:              >6.4%  Glycemic control for   <  7.0% adults with diabetes     Urinalysis: No results for input(s): "COLORURINE", "LABSPEC", "PHURINE", "GLUCOSEU", "HGBUR", "BILIRUBINUR", "KETONESUR", "PROTEINUR", "UROBILINOGEN", "NITRITE", "LEUKOCYTESUR" in the last 72 hours.  Invalid input(s): "APPERANCEUR"    Imaging: No results found.   Medications:    sodium chloride Stopped (11/16/22 1155)   anticoagulant sodium citrate      (feeding supplement) PROSource Plus  30 mL Oral BID BM   Chlorhexidine Gluconate Cloth  6 each Topical Q0600   cinacalcet  30 mg Oral Q supper   epoetin (EPOGEN/PROCRIT) injection  4,000 Units Intravenous Q M,W,F-HD   feeding supplement (NEPRO CARB STEADY)  237 mL Oral TID BM   fentaNYL  1 patch Transdermal Q72H   gabapentin  100 mg Oral Once per day on Mon Wed Fri   Gerhardt's butt cream   Topical QID   heparin injection (subcutaneous)  5,000 Units Subcutaneous Q8H   levETIRAcetam  1,000 mg Oral Daily   levETIRAcetam  250 mg Oral Q M,W,F   metoprolol succinate  50 mg Oral Daily    pantoprazole  20 mg Oral BID   psyllium  1 packet Oral Daily   QUEtiapine  25 mg Oral QHS   sertraline  25 mg Oral Daily   sodium chloride flush  10-40 mL Intracatheter Q12H   umeclidinium-vilanterol  1 puff Inhalation Daily    Assessment/ Plan:     73 y.o. 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 She presents to the ED with worsening lower extremity pain. Patient will be admitted for Back pain [M54.9] Bilateral leg pain [M79.604, M79.605]   #1 ESRD on hemodialysis.  Had stable dialysis on Friday.  She is on Monday Wednesday Friday schedule.  Will schedule for dialysis tomorrow.   #2: Anemia: Continue Epogen and iron intravenously at dialysis.   #3: Second hyperparathyroidism: Will continue Sensipar and binders as needed.   #4: Vertebral osteomyelitis: Continue cefazolin with dialysis.  ID on the case.  Neurosurgery is also on the case  #5: Seizure disorder: Continue the Keppra at the present doses.  #6: Hypertension: Patient is advised to stay on 2 g salt restricted diet.  Will continue the metoprolol for now.  If the blood pressure does not improve she may need to be added with calcium channel blockers.   Will follow.    LOS: 53 Lorain Childes, MD [facility] 5/12/202411:53 AM

## 2022-12-05 DIAGNOSIS — M4646 Discitis, unspecified, lumbar region: Secondary | ICD-10-CM | POA: Diagnosis not present

## 2022-12-05 LAB — RENAL FUNCTION PANEL
Albumin: 2.2 g/dL — ABNORMAL LOW (ref 3.5–5.0)
Anion gap: 11 (ref 5–15)
BUN: 43 mg/dL — ABNORMAL HIGH (ref 8–23)
CO2: 27 mmol/L (ref 22–32)
Calcium: 10 mg/dL (ref 8.9–10.3)
Chloride: 98 mmol/L (ref 98–111)
Creatinine, Ser: 6.85 mg/dL — ABNORMAL HIGH (ref 0.44–1.00)
GFR, Estimated: 6 mL/min — ABNORMAL LOW (ref >60)
Glucose, Bld: 105 mg/dL — ABNORMAL HIGH (ref 70–99)
Phosphorus: 5.8 mg/dL — ABNORMAL HIGH (ref 2.5–4.6)
Potassium: 3.9 mmol/L (ref 3.5–5.1)
Sodium: 136 mmol/L (ref 135–145)

## 2022-12-05 LAB — CBC
HCT: 28.7 % — ABNORMAL LOW (ref 36.0–46.0)
Hemoglobin: 8.6 g/dL — ABNORMAL LOW (ref 12.0–15.0)
MCH: 25.1 pg — ABNORMAL LOW (ref 26.0–34.0)
MCHC: 30 g/dL (ref 30.0–36.0)
MCV: 83.7 fL (ref 80.0–100.0)
Platelets: 221 10*3/uL (ref 150–400)
RBC: 3.43 MIL/uL — ABNORMAL LOW (ref 3.87–5.11)
RDW: 19.3 % — ABNORMAL HIGH (ref 11.5–15.5)
WBC: 5.5 10*3/uL (ref 4.0–10.5)
nRBC: 0 % (ref 0.0–0.2)

## 2022-12-05 MED ORDER — EPOETIN ALFA 4000 UNIT/ML IJ SOLN
INTRAMUSCULAR | Status: AC
  Filled 2022-12-05: qty 1

## 2022-12-05 MED ORDER — PENTAFLUOROPROP-TETRAFLUOROETH EX AERO: 1.0000 | INHALATION_SPRAY | CUTANEOUS | Status: DC | PRN

## 2022-12-05 MED ORDER — CEPHALEXIN 500 MG PO CAPS
500.0000 mg | ORAL_CAPSULE | Freq: Every day | ORAL | Status: AC
  Administered 2022-12-05 – 2023-01-01 (×28): 500 mg via ORAL
  Filled 2022-12-05 (×28): qty 1

## 2022-12-05 MED ORDER — CHLORHEXIDINE GLUCONATE CLOTH 2 % EX PADS
6.0000 | MEDICATED_PAD | Freq: Every day | CUTANEOUS | Status: DC
  Administered 2022-12-05 – 2023-01-12 (×39): 6 via TOPICAL

## 2022-12-05 MED ORDER — LIDOCAINE HCL (PF) 1 % IJ SOLN: 5.0000 mL | INTRAMUSCULAR | Status: DC | PRN

## 2022-12-05 MED ORDER — ANTICOAGULANT SODIUM CITRATE 4% (200MG/5ML) IV SOLN: 5.0000 mL | Status: DC | PRN

## 2022-12-05 MED ORDER — ALTEPLASE 2 MG IJ SOLR: 2.0000 mg | Freq: Once | INTRAMUSCULAR | Status: DC | PRN

## 2022-12-05 MED ORDER — HEPARIN SODIUM (PORCINE) 1000 UNIT/ML IJ SOLN
INTRAMUSCULAR | Status: AC
  Filled 2022-12-05: qty 4

## 2022-12-05 MED ORDER — HEPARIN SODIUM (PORCINE) 1000 UNIT/ML DIALYSIS
1000.0000 [IU] | INTRAMUSCULAR | Status: DC | PRN
  Administered 2022-12-05: 1000 [IU]
  Filled 2022-12-05: qty 1

## 2022-12-05 MED ORDER — LIDOCAINE-PRILOCAINE 2.5-2.5 % EX CREA: 1.0000 | TOPICAL_CREAM | CUTANEOUS | Status: DC | PRN

## 2022-12-05 NOTE — Procedures (Signed)
Received patient in bed to unit.  Alert and oriented.  Informed consent signed and in chart.   TX duration:  Patient tolerated well.  Transported back to the room  Alert, without acute distress.  Hand-off given to patient's nurse.   Access used: Left chest HD catheter. Access issues: NONE  Total UF removed: Medication(s) given: ativan 1mg  PO. Epogen 4000 units.   [facility]

## 2022-12-05 NOTE — Progress Notes (Addendum)
Progress Note   [name] ZOX:[id number] DOB: 1950/08/24 DOA: 10/12/2022     54 DOS: the patient was seen and examined on 12/05/2022     Subjective:   Patient seen and examined at bedside while undergoing hemodialysis today I asked her if she has heard from the daughter and she said no. For the last several days I have been calling the daughter's number on daily basis however she has not been able to answer my call. Today I spoke with [name] the boyfriend but he apparently does not want to be involved in the decision-making for patient and he deferred that the daughter makes this decision. Denies any acute complaints Denies chest pain nausea vomiting or abdominal pain     Brief Narrative / Hospital Course:  Cynthia Dean is a 72 y.o. 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 She presents to the ED with worsening lower extremity pain. Admitted for possible Discitis/osteomyelitis at L4-L5: w/ suspected intradiscal abscess as per MRI. 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    4/18, HD access / fistula clotted off during dialysis. 4/19 pt taken for procedure to address fistula malfunction, but was agitated and combative, resfused procedure. 4/19 vascular placed R femoral temp HD cath placed, resumed HD and pemcath placed 4/24   Patient has had waxing and waning mental status with intermittently refusing dialysis. Patient's family have decided to continue hemodialysis after speaking with their PCP. Hospital course prolonged, complicated by patient's inability to tolerate sitting in recliner chair for dialysis.  She only agrees to dialysis in the bed.  This is a barrier to discharge as no outpatient dialysis centers can accommodate.       5/3: titrating up on fentanyl patch 12.5 -->  25 mcg, seems to be helping, if still unable to sit up in chair may consider increasing dose further      Consultants:  Neurosurgery Infectious Disease Vascular surgery  Nephrology    Procedures: 09/22/22: Stent placement to the AV graft  10/13/22: CT aspiration of right psoas muscle abscess yielding 2 ml purulent material  11/11/22: R Femoral V catheter placement 11/16/22: Permcath placement to L IJ        ASSESSMENT & PLAN:   Principal Problem:   Back pain Active Problems:   Anemia   Bilateral leg pain   ESRD on dialysis [facility])   Essential hypertension   Dyslipidemia   Seizure disorder (HCC)   Diabetic retinopathy without macular edema associated with type 2 diabetes mellitus (HCC)   GERD (gastroesophageal reflux disease)   Diabetic neuropathy (HCC)   Anemia of chronic disease   Insulin-requiring or dependent type II diabetes mellitus (HCC)   Discitis of lumbar region   Myositis   Psoas abscess (HCC)     Lumbar discitis, 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   Wound cx growing staph capitis.   ID consulted.  Abx de-escalated to cefazolin.   Not a good surgical candidate as per neuro surg.   Pt having severe low back and bilateral leg pain and has not been able to tolerate any position except laying nearly flat in the bed Has completed 6 weeks of cefazolin,  End Date: 12/02/22 pain management  per orders -- palliative care following peripherally 5/3: titrating up on fentanyl patch 12.5 --> 25 mcg - pt reports pain is improved 5/6, will trial up in chair as tolerated We will continue discussion of goals of care with patient's family if they are able to return my call   Confusion and agitation - improved Hospital delirium  Pt was agitated, and also not responding to questions or commands during early  hospitalization.  Mental status did start to improve, and now pt is calm and interactive, however, at this time she has been having waxing and waning mental status Delirium precautions cont seroquel 50 mg nightly to help with delirium and sleep.   ESRD: on HD MWF.  Pt refused HD on 10/17/22, 10/18/22 and 3/27, 4/1.   When attempts made to do HD in chair, she did not tolerate on account of severe back pain Recommend consideration for home hemodialysis if no facility capable to do HD in the bed. or discharge to dialysis capable skilled nursing facility   Generalized weakness / Physical Debility:  PT recs SNF. TOC following. Pt is bedridden and very high risk for developing pressure injuries of skin -- reposition frequently.   Monitor pressures areas on skin and offload as possible.   Severe back and bilateral leg pain due to severe multi-level lumbar degenerative disc disease. Pt has bulging discs at L2-3, L3-4, L4-5 and L5-S1. Pt will continue to have back and leg pain once antibiotics are complete. Pain mgmt per orders  Pain control complicated by AMS and hypotension with escalation - titrating up on fentanyl patch slowly, cautious use of IV fentanyl for breakthrough pain    ACD:  likely secondary ESRD. H&H are stable  Monitor periodic CBC   Hypokalemia-improved Replace as needed Monitor BMP   Peripheral neuropathy --  Continue low dose gabapentin and monitor   Hx of seizures:  continue on home dose of Keppra   HLD:  continue on statin    Hx of COPD: w/o exacerbation.  Continue on bronchodilators    DM2: well controlled HbA1c 5.2.  d/c'ed BG checks    HTN:  Antihypertensives as blood pressure allows   GERD:  continue on PPI    Depression: severity unknown.  Continue on home dose of sertraline     Overweight: BMI 28.2.  Wt loss efforts recommended   Right foot pain  see peripheral neuropathy     DVT prophylaxis: heparin sq     Code Status: DNR      Current Admission Status: inpatient  TOC needs / Dispo plan: SNF Barriers to discharge / significant pending items: unable to sit to toelrate outpatient HD, working w/ PT    Laboratory data: I reviewed patient's labs showing sodium sodium 136 potassium 3.9 creatinine 6.8   Family Communication: I was able to speak with patient's boyfriend today.  Daughter still have not been able to answer my call and have not called back.   Physical Exam Constitutional:      General: She is not in acute distress.    Appearance: She is obese.  Cardiovascular:     Rate and Rhythm: Normal rate and regular rhythm.  Pulmonary:     Effort: Pulmonary effort is normal. No respiratory distress.     Breath sounds: Normal breath sounds.  Neurological:     General: Appears generally lethargic with some weakness noted to bilateral lower extremities.  Right greater than left    Mental Status: She is alert. Mental status is  at baseline.     Time spent: 45 minutes       Vitals:   12/05/22 1330 12/05/22 1358 12/05/22 1409 12/05/22 1413  BP: 101/66 102/62 111/65   Pulse: 73 75 72   Resp: 13 14 14    Temp:   (!) 97.4 F (36.3 C)   TempSrc:   Oral   SpO2: 94% 97% 95%   Weight:    88.9 kg  Height:         Author: Loyce Dys, MD 12/05/2022 2:15 PM  For on call review [email or web address].    Daughter [name] finally called me back today.  According to her she works during the daytime and she is not allowed to be on her phone and that is why she has not been able to answer my calls.  According to her she has had family discussion as well as discussion with the family and anytime they had spoken to the patient she insisted that she wants dialysis and all the rest of the family also want her to continue with dialysis.  I made her know the quality of life patient is having now.  Family is hoping that case managers will be able to get her possibility of outpatient dialysis.[name]

## 2022-12-05 NOTE — Progress Notes (Signed)
[facility]  PROGRESS NOTE   Subjective:   Seen on hemodialysis treatment.     HEMODIALYSIS FLOWSHEET:  Blood Flow Rate (mL/min): 400 mL/min Arterial Pressure (mmHg): -200 mmHg Venous Pressure (mmHg): 240 mmHg TMP (mmHg): 4 mmHg Ultrafiltration Rate (mL/min): 778 mL/min Dialysate Flow Rate (mL/min): 300 ml/min Dialysis Fluid Bolus: Normal Saline Bolus Amount (mL): 500 mL   Objective:  Vital signs: Blood pressure 111/65, pulse 72, temperature (!) 97.4 F (36.3 C), temperature source Oral, resp. rate 14, height 6' (1.829 m), weight 88.9 kg, SpO2 95 %.  Intake/Output Summary (Last 24 hours) at 12/05/2022 1535 Last data filed at 12/05/2022 1430 Gross per 24 hour  Intake 480 ml  Output 1700 ml  Net -1220 ml    Filed Weights   12/02/22 1240 12/05/22 1021 12/05/22 1413  Weight: 91.3 kg 90.6 kg 88.9 kg     Physical Exam: General:  No acute distress, laying in bed  Head:  Normocephalic, atraumatic. Moist oral mucosal membranes  Eyes:  Anicteric  Neck:  Supple  Lungs:   Clear to auscultation, normal effort  Heart:  regular  Abdomen:   Soft, nontender, bowel sounds present  Extremities:  peripheral edema.  Neurologic:  Awake, alert, following commands  Skin:  No lesions  Access: LIJ permcath    Basic Metabolic Panel: Recent Labs  Lab 11/30/22 0837 12/01/22 0542 12/02/22 0551 12/03/22 0550 12/04/22 0618 12/05/22 1037  NA 136 133* 133* 133* 134* 136  K 3.7 3.8 3.8 3.2* 4.0 3.9  CL 99 95* 96* 95* 98 98  CO2 28 28 29 28 28 27   GLUCOSE 144* 82 111* 116* 111* 105*  BUN 40* 20 38* 20 31* 43*  CREATININE 6.28* 3.89* 5.58* 3.62* 5.23* 6.85*  CALCIUM 10.2 9.7 9.7 9.2 10.1 10.0  PHOS 5.3*  --   --   --   --  5.8*    GFR: Estimated Creatinine Clearance: 9.4 mL/min (A) (by C-G formula based on SCr of 6.85 mg/dL (H)).  Liver Function Tests: Recent Labs  Lab 11/30/22 0837 12/05/22 1037  ALBUMIN 2.4* 2.2*    No results for input(s): "LIPASE",  "AMYLASE" in the last 168 hours. No results for input(s): "AMMONIA" in the last 168 hours.  CBC: Recent Labs  Lab 11/30/22 0837 12/02/22 0843 12/05/22 1037  WBC 6.8 6.0 5.5  HGB 8.8* 8.4* 8.6*  HCT 29.4* 28.7* 28.7*  MCV 84.2 84.2 83.7  PLT 192 219 221      HbA1C: Hemoglobin A1C  Date/Time Value Ref Range Status  06/15/2014 12:58 AM 7.9 (H) 4.2 - 6.3 % Final    Comment:    The American Diabetes Association recommends that a primary goal of therapy should be <7% and that physicians should reevaluate the treatment regimen in patients with HbA1c values consistently >8%.   12/24/2013 11:18 AM 8.2 (H) 4.2 - 6.3 % Final    Comment:    The American Diabetes Association recommends that a primary goal of therapy should be <7% and that physicians should reevaluate the treatment regimen in patients with HbA1c values consistently >8%.    Hgb A1c MFr Bld  Date/Time Value Ref Range Status  10/13/2022 11:46 AM 5.2 4.8 - 5.6 % Final    Comment:    (NOTE)         Prediabetes: 5.7 - 6.4         Diabetes: >6.4         Glycemic control for adults with diabetes: <7.0  03/23/2022 06:47 AM 5.8 (H) 4.8 - 5.6 % Final    Comment:    (NOTE) Pre diabetes:          5.7%-6.4%  Diabetes:              >6.4%  Glycemic control for   <7.0% adults with diabetes     Urinalysis: No results for input(s): "COLORURINE", "LABSPEC", "PHURINE", "GLUCOSEU", "HGBUR", "BILIRUBINUR", "KETONESUR", "PROTEINUR", "UROBILINOGEN", "NITRITE", "LEUKOCYTESUR" in the last 72 hours.  Invalid input(s): "APPERANCEUR"    Imaging: No results found.   Medications:    sodium chloride Stopped (11/16/22 1155)   anticoagulant sodium citrate     anticoagulant sodium citrate      (feeding supplement) PROSource Plus  30 mL Oral BID BM   cephALEXin  500 mg Oral QHS   Chlorhexidine Gluconate Cloth  6 each Topical Q0600   Chlorhexidine Gluconate Cloth  6 each Topical Q0600   cinacalcet  30 mg Oral Q supper    epoetin (EPOGEN/PROCRIT) injection  4,000 Units Intravenous Q M,W,F-HD   feeding supplement (NEPRO CARB STEADY)  237 mL Oral TID BM   fentaNYL  1 patch Transdermal Q72H   gabapentin  100 mg Oral Once per day on Mon Wed Fri   Gerhardt's butt cream   Topical QID   heparin injection (subcutaneous)  5,000 Units Subcutaneous Q8H   levETIRAcetam  1,000 mg Oral Daily   levETIRAcetam  250 mg Oral Q M,W,F   metoprolol succinate  50 mg Oral Daily   pantoprazole  20 mg Oral BID   psyllium  1 packet Oral Daily   QUEtiapine  25 mg Oral QHS   sertraline  25 mg Oral Daily   sodium chloride flush  10-40 mL Intracatheter Q12H   umeclidinium-vilanterol  1 puff Inhalation Daily    Assessment/ Plan:     72 y.o. 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 She presents to the ED with worsening lower extremity pain. Patient will be admitted for Back pain [M54.9] Bilateral leg pain [M79.604, M79.605]   #1 ESRD on hemodialysis.  Seen and examined on hemodialysis treatment. Continue MWF schedule.    #2: Anemia: hemoglobin 8.6. Continue Epogen and iron intravenously at dialysis.   #3: Second hyperparathyroidism: Will continue Sensipar and binders as needed.   #4: Vertebral osteomyelitis: Continue cefazolin with dialysis.  ID on the case.  Neurosurgery is also on the case  #5: Hypertension: Continue metoprolol.    LOS: 54 Lamont Dowdy, MD [facility] 5/13/20243:35 PM

## 2022-12-05 NOTE — Progress Notes (Signed)
Pharmacy - Antimicrobial Stewardship  Patient with methicillin-susc S. Capitis discitis.  Completed 6 weeks of cefazolin 12/02/22. Per d/w ID started keflex 500mg  daily (at night so given after HD) for chronic suppressive therapy.  Ordered x 4 weeks.    Juliette Alcide, PharmD, BCPS, BCIDP Work Cell: [phone] 12/05/2022 3:32 PM

## 2022-12-05 NOTE — Discharge Planning (Signed)
Continuing to follow for outpatient hemodialysis placement. Patient will need to be reactivated at home clinic prior to discharge, due to extended length of stay.

## 2022-12-06 DIAGNOSIS — M4646 Discitis, unspecified, lumbar region: Secondary | ICD-10-CM | POA: Diagnosis not present

## 2022-12-06 DIAGNOSIS — Z7189 Other specified counseling: Secondary | ICD-10-CM | POA: Diagnosis not present

## 2022-12-06 NOTE — TOC Progression Note (Signed)
Transition of Care [facility]) - Progression Note    Patient Details  Name: Cynthia Dean MRN: [id number] Date of Birth: 02-Mar-1951  Transition of Care [facility]) CM/SW Contact  [name], [place] Phone Number: 12/06/2022, 7:55 PM  Clinical Narrative:     CSW spoke to [facility] to see what he advises regarding this patient.  Per Timothy Lasso, he said to wait and see what Ethics advises.  Zack also suggested that TOC contact [facility] to see if they can review patient since they do have stretcher dialysis.  Per Zack there are not any other stretcher dialysis SNFs in the state.  TOC to continue to follow patient's progress throughout discharge planning.   Expected Discharge Plan and Services  Current disposition unknown.    Social Determinants of Health (SDOH) Interventions SDOH Screenings   Food Insecurity: No Food Insecurity (10/12/2022)  Housing: Low Risk  (10/12/2022)  Transportation Needs: No Transportation Needs (10/12/2022)  Utilities: Not At Risk (10/12/2022)  Financial Resource Strain: Low Risk  (11/12/2017)  Physical Activity: Insufficiently Active (11/12/2017)  Social Connections: Moderately Integrated (11/12/2017)  Stress: No Stress Concern Present (11/12/2017)  Tobacco Use: Medium Risk (11/16/2022)    Readmission Risk Interventions     No data to display

## 2022-12-06 NOTE — Progress Notes (Signed)
Daily Progress Note   Patient Name: Cynthia Dean       Date: 12/06/2022 DOB: January 02, 1951  Age: 72 y.o. MRN#: [id number] Attending Physician: Loyce Dys, MD Primary Care Physician: Loistine Chance, MD Admit Date: 10/12/2022  Reason for Consultation/Follow-up: Establishing goals of care  Subjective: PMT provider added to detailed epic chat with numerous members of the care team regarding plans moving forward. Spoke with [facility] Minerva Areola as well as nephrology Lydia. Discussion of potential for home PD or HD. Discussion of continuing bed search as [facility] cannot take patient.    In to see patient. She is resting in bed snoring, no distress noted.  Significant other [name] is at bedside. He discusses "it's hard to get her motivated" to engage with therapy and work on improving strength or mobility. He states he is not sure what is next and advises he will continue talking with daughter.   Length of Stay: 55  Current Medications: Scheduled Meds:   (feeding supplement) PROSource Plus  30 mL Oral BID BM   cephALEXin  500 mg Oral QHS   Chlorhexidine Gluconate Cloth  6 each Topical Q0600   Chlorhexidine Gluconate Cloth  6 each Topical Q0600   cinacalcet  30 mg Oral Q supper   epoetin (EPOGEN/PROCRIT) injection  4,000 Units Intravenous Q M,W,F-HD   feeding supplement (NEPRO CARB STEADY)  237 mL Oral TID BM   fentaNYL  1 patch Transdermal Q72H   gabapentin  100 mg Oral Once per day on Mon Wed Fri   Gerhardt's butt cream   Topical QID   heparin injection (subcutaneous)  5,000 Units Subcutaneous Q8H   levETIRAcetam  1,000 mg Oral Daily   levETIRAcetam  250 mg Oral Q M,W,F   metoprolol succinate  50 mg Oral Daily   pantoprazole  20 mg Oral BID   psyllium  1 packet Oral Daily   QUEtiapine   25 mg Oral QHS   sertraline  25 mg Oral Daily   sodium chloride flush  10-40 mL Intracatheter Q12H   umeclidinium-vilanterol  1 puff Inhalation Daily    Continuous Infusions:  sodium chloride Stopped (11/16/22 1155)   anticoagulant sodium citrate     anticoagulant sodium citrate      PRN Meds: alteplase, alteplase, anticoagulant sodium citrate, anticoagulant sodium citrate, fentaNYL (SUBLIMAZE) injection,  guaiFENesin, heparin, hydrALAZINE, lidocaine (PF), lidocaine (PF), lidocaine-prilocaine, lidocaine-prilocaine, LORazepam, ondansetron, pentafluoroprop-tetrafluoroeth, phenol, senna-docusate, sodium chloride flush  Physical Exam Constitutional:      Comments: Eyes closed.   Pulmonary:     Effort: Pulmonary effort is normal.             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/17/2022   Back pain 10/12/2022   Discitis of lumbar region 10/12/2022   Myositis 10/12/2022   Hemoglobin drop 08/28/2022   Chronic respiratory failure with hypoxia (HCC) 08/26/2022   Acute on chronic blood loss anemia 08/26/2022   History of lower GI bleeding 08/26/2022   Insulin-requiring or dependent type II diabetes mellitus (HCC) 08/26/2022   Chronic anticoagulation 08/26/2022   History of pulmonary embolism 03/20/2022 08/26/2022   Breast cancer, left S/P partial mastectomy,03/04/2022 08/26/2022   Generalized weakness    Unable to  ambulate 05/09/2022   ESRD on dialysis [facility])    Bilateral leg pain    Depression    Pulmonary emboli (HCC)    Obesity with body mass index (BMI) of 30.0 to 39.9    Anemia    Chronic diastolic CHF (congestive heart failure) (HCC)    Seroma of breast    Bacteremia due to coagulase-negative Staphylococcus    Left breast abscess    Urinary tract infection 03/22/2022   Anemia of chronic disease 03/22/2022   Acute pulmonary embolism without acute cor pulmonale (HCC) 03/20/2022   Dyslipidemia 03/20/2022   Diabetic neuropathy (HCC) 03/20/2022   Sepsis due to cellulitis (HCC) 03/20/2022   Delay kidney tx func d/t fluid overload requiring acute dialysis (HCC) 01/29/2020   Delay kidney tx func d/t ATN and fluid overload require acute dialysis (HCC) 01/29/2020   Hypoglycemia 10/21/2019   Unresponsiveness 10/21/2019   GERD (gastroesophageal reflux disease) 10/21/2019   Hypothermia    Sepsis with encephalopathy without septic shock (HCC)    Diarrhea    Acute metabolic encephalopathy    Acute on chronic diastolic CHF (congestive heart failure) (HCC)    Acute on chronic respiratory failure with hypoxia (HCC) 07/30/2019   Rectal bleed 02/13/2019   Acute respiratory failure with hypoxia (HCC) 12/31/2018   History of CVA (cerebrovascular accident) 05/29/2018   Aphasia as late effect of cerebrovascular accident (CVA) 05/01/2018   Seizure disorder (HCC) 03/14/2018   Stroke (HCC) 03/07/2018   Slurred speech 11/14/2017   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) 11/12/2017   CAD (coronary artery disease) 11/12/2017   COPD (chronic obstructive pulmonary disease) (HCC) 11/12/2017   ESRD on hemodialysis (HCC) 11/12/2017   CVA (cerebral vascular accident) (HCC) 04/14/2016   Essential hypertension 11/27/2015   Hyperlipidemia 11/27/2015   Prolonged Q-T interval on ECG 11/26/2015   Aphasia complicating stroke 11/05/2015  Cervical spine arthritis 07/27/2015   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   Previously daughter was amenable to consider home PD.   PMT will continue to shadow.     Code Status:    Code Status Orders  (From admission, onward)           Start     Ordered   11/01/22 1535  Do not attempt resuscitation (DNR)  Continuous       Question Answer Comment  If patient has no pulse and is not breathing Do Not Attempt Resuscitation   If patient has a pulse and/or is breathing: Medical Treatment Goals COMFORT MEASURES: Keep clean/warm/dry, use medication by any route; positioning, wound care and other measures to relieve pain/suffering; use oxygen, suction/manual treatment of airway obstruction for comfort; do not transfer unless for comfort needs.   Consent: Discussion documented in EHR or advanced directives reviewed      11/01/22 1534           Code Status History     Date Active Date Inactive Code Status Order ID Comments User Context   10/12/2022 1101 11/01/2022 1534 Full Code [id number]  Charise Killian, MD Inpatient   08/26/2022 2018 09/09/2022 2244 Full Code [id number]  Andris Baumann, MD ED   05/09/2022 0919 05/17/2022 0109 Full Code [id number]  Lorretta Harp, MD ED   03/20/2022 0121 04/02/2022 0126 Full Code [id number]  Mansy, Vernetta Honey, MD ED   01/29/2020 2038 01/30/2020 2215 Full Code [id number]  Lucile Shutters, MD Inpatient   01/29/2020 1210 01/29/2020 2038 Full Code [id number]  Lucile Shutters, MD Inpatient   10/21/2019 0902 10/25/2019 2211 Full Code [id number]  Lorretta Harp, MD ED   07/30/2019 2224 08/11/2019 2130 Full Code [id number]  Mansy, Vernetta Honey, MD ED   02/13/2019  1329 02/16/2019 1849 Full Code [id number]  Jimmye Norman, NP ED   12/31/2018 1159 01/02/2019 1330 Full Code [id number]  Jimmye Norman, NP ED   05/29/2018 1541 05/30/2018 2041 Full Code [id number]  Alford Highland, MD ED   03/14/2018 0141 03/15/2018 0336 Full Code [id number]  Cammy Copa, MD Inpatient   03/08/2018 0005 03/09/2018 2108 Full Code [id number]  Altamese Dilling, MD Inpatient   11/15/2017 0017 11/17/2017 1809 Full Code [id number]  Oralia Manis, MD ED   11/12/2017 2219 11/13/2017 1935 Full Code [id number]  Oralia Manis, MD ED   04/14/2016 1330 04/14/2016 1700 Full Code [id number]  Adrian Saran, MD ED   08/08/2015 2257 08/09/2015 2054 Full Code [id number]  Wyatt Haste, MD ED   04/17/2015 1334 04/17/2015 1836 Full Code [id number]  Annice Needy, MD Inpatient      Advance Directive Documentation    Flowsheet Row Most Recent Value  Type of Advance Directive Healthcare Power of Attorney  Pre-existing out of facility DNR order (yellow form or pink MOST form) --  "MOST" Form in Place? --       Prognosis:  Poor overall   Care plan was discussed with team in chat  Thank you for allowing the Palliative Medicine Team to assist in the care of this patient.    Morton Stall, NP  Please contact  Palliative Medicine Team phone at [phone] for questions and concerns.

## 2022-12-06 NOTE — TOC Progression Note (Signed)
Transition of Care [facility]) - Progression Note    Patient Details  Name: Cynthia Dean MRN: [id number] Date of Birth: 1950/12/07  Transition [facility] Care [facility]) CM/SW Contact  Allena Katz, LCSW Phone Number: 12/06/2022, 2:08 PM  Clinical Narrative:     CSW spoke with Clide Cliff at [facility] who reports they are unable to accept pt. Ethics consult requested from [facility].       Expected Discharge Plan and Services                                               Social Determinants of Health (SDOH) Interventions SDOH Screenings   Food Insecurity: No Food Insecurity (10/12/2022)  Housing: Low Risk  (10/12/2022)  Transportation Needs: No Transportation Needs (10/12/2022)  Utilities: Not At Risk (10/12/2022)  Financial Resource Strain: Low Risk  (11/12/2017)  Physical Activity: Insufficiently Active (11/12/2017)  Social Connections: Moderately Integrated (11/12/2017)  Stress: No Stress Concern Present (11/12/2017)  Tobacco Use: Medium Risk (11/16/2022)    Readmission Risk Interventions     No data to display

## 2022-12-06 NOTE — Progress Notes (Signed)
[facility]  PROGRESS NOTE   Subjective:   Hemodialysis treatment yesterday.   Objective:  Vital signs: Blood pressure 128/65, pulse 73, temperature 97.9 F (36.6 C), resp. rate 16, height 6' (1.829 m), weight 88.9 kg, SpO2 (!) 89 %.  Intake/Output Summary (Last 24 hours) at 12/06/2022 1653 Last data filed at 12/06/2022 1417 Gross per 24 hour  Intake 240 ml  Output --  Net 240 ml    Filed Weights   12/02/22 1240 12/05/22 1021 12/05/22 1413  Weight: 91.3 kg 90.6 kg 88.9 kg     Physical Exam: General:  No acute distress, laying in bed  Head:  Normocephalic, atraumatic. Moist oral mucosal membranes  Eyes:  Anicteric  Neck:  Supple  Lungs:   Clear to auscultation, normal effort  Heart:  regular  Abdomen:   Soft, nontender, bowel sounds present  Extremities:  peripheral edema.  Neurologic:  Awake, alert, following commands  Skin:  No lesions  Access: LIJ permcath    Basic Metabolic Panel: Recent Labs  Lab 11/30/22 0837 12/01/22 0542 12/02/22 0551 12/03/22 0550 12/04/22 0618 12/05/22 1037  NA 136 133* 133* 133* 134* 136  K 3.7 3.8 3.8 3.2* 4.0 3.9  CL 99 95* 96* 95* 98 98  CO2 28 28 29 28 28 27   GLUCOSE 144* 82 111* 116* 111* 105*  BUN 40* 20 38* 20 31* 43*  CREATININE 6.28* 3.89* 5.58* 3.62* 5.23* 6.85*  CALCIUM 10.2 9.7 9.7 9.2 10.1 10.0  PHOS 5.3*  --   --   --   --  5.8*    GFR: Estimated Creatinine Clearance: 9.4 mL/min (A) (by C-G formula based on SCr of 6.85 mg/dL (H)).  Liver Function Tests: Recent Labs  Lab 11/30/22 0837 12/05/22 1037  ALBUMIN 2.4* 2.2*    No results for input(s): "LIPASE", "AMYLASE" in the last 168 hours. No results for input(s): "AMMONIA" in the last 168 hours.  CBC: Recent Labs  Lab 11/30/22 0837 12/02/22 0843 12/05/22 1037  WBC 6.8 6.0 5.5  HGB 8.8* 8.4* 8.6*  HCT 29.4* 28.7* 28.7*  MCV 84.2 84.2 83.7  PLT 192 219 221      HbA1C: Hemoglobin A1C  Date/Time Value Ref Range Status  06/15/2014  12:58 AM 7.9 (H) 4.2 - 6.3 % Final    Comment:    The American Diabetes Association recommends that a primary goal of therapy should be <7% and that physicians should reevaluate the treatment regimen in patients with HbA1c values consistently >8%.   12/24/2013 11:18 AM 8.2 (H) 4.2 - 6.3 % Final    Comment:    The American Diabetes Association recommends that a primary goal of therapy should be <7% and that physicians should reevaluate the treatment regimen in patients with HbA1c values consistently >8%.    Hgb A1c MFr Bld  Date/Time Value Ref Range Status  10/13/2022 11:46 AM 5.2 4.8 - 5.6 % Final    Comment:    (NOTE)         Prediabetes: 5.7 - 6.4         Diabetes: >6.4         Glycemic control for adults with diabetes: <7.0   03/23/2022 06:47 AM 5.8 (H) 4.8 - 5.6 % Final    Comment:    (NOTE) Pre diabetes:          5.7%-6.4%  Diabetes:              >6.4%  Glycemic control for   <  7.0% adults with diabetes     Urinalysis: No results for input(s): "COLORURINE", "LABSPEC", "PHURINE", "GLUCOSEU", "HGBUR", "BILIRUBINUR", "KETONESUR", "PROTEINUR", "UROBILINOGEN", "NITRITE", "LEUKOCYTESUR" in the last 72 hours.  Invalid input(s): "APPERANCEUR"    Imaging: No results found.   Medications:    sodium chloride Stopped (11/16/22 1155)   anticoagulant sodium citrate     anticoagulant sodium citrate      (feeding supplement) PROSource Plus  30 mL Oral BID BM   cephALEXin  500 mg Oral QHS   Chlorhexidine Gluconate Cloth  6 each Topical Q0600   Chlorhexidine Gluconate Cloth  6 each Topical Q0600   cinacalcet  30 mg Oral Q supper   epoetin (EPOGEN/PROCRIT) injection  4,000 Units Intravenous Q M,W,F-HD   feeding supplement (NEPRO CARB STEADY)  237 mL Oral TID BM   fentaNYL  1 patch Transdermal Q72H   gabapentin  100 mg Oral Once per day on Mon Wed Fri   Gerhardt's butt cream   Topical QID   heparin injection (subcutaneous)  5,000 Units Subcutaneous Q8H   levETIRAcetam   1,000 mg Oral Daily   levETIRAcetam  250 mg Oral Q M,W,F   metoprolol succinate  50 mg Oral Daily   pantoprazole  20 mg Oral BID   psyllium  1 packet Oral Daily   QUEtiapine  25 mg Oral QHS   sertraline  25 mg Oral Daily   sodium chloride flush  10-40 mL Intracatheter Q12H   umeclidinium-vilanterol  1 puff Inhalation Daily    Assessment/ Plan:     Cynthia Dean is a 72 y.o. female with end stage renal disease on hemodialysis, CAD, COPD with intermittent home O2, hypertension, hyperlipidemia, stroke, diabetes who has been admitted since 3/20. Initially admitted for vertebral osteomyelitis. She has completed IV antibiotics. Now is working on placement.   [facility] TTS [facility] LIJ permcath   #1 ESRD on hemodialysis.    Continue MWF schedule.    #2: Anemia with chronic kidney disease Continue Epogen and IV iron   #3: Second hyperparathyroidism: continue Sensipar and binders   #4: Hypertension: Continue metoprolol.    LOS: 55 Cynthia Dowdy, MD [facility] 5/14/20244:53 PM

## 2022-12-06 NOTE — Progress Notes (Signed)
Progress Note   [name] ZOX:[id number] DOB: [birth year] DOA: 10/12/2022     55 DOS: the patient was seen and examined on 12/06/2022     Subjective:   Patient seen and examined at bedside  Long hospital stay currently Case managers are working on getting ethics committee/hospital administration involved Denies any acute complaints Discussed with patient's daughter and apparently other family have talked to patient and they want to continue with hemodialysis. Case workers are having challenges getting any of the outpatient facilities to accept the patient. Denies chest pain nausea vomiting or abdominal pain     Brief Narrative / Hospital Course:  Cynthia Dean is a 72 y.o. 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 She presents to the ED with worsening lower extremity pain. Admitted for possible Discitis/osteomyelitis at L4-L5: w/ suspected intradiscal abscess as per MRI. 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    4/18, HD access / fistula clotted off during dialysis. 4/19 pt taken for procedure to address fistula malfunction, but was agitated and combative, resfused procedure. 4/19 vascular placed R femoral temp HD cath placed, resumed HD and pemcath placed 4/24   Patient has had waxing and waning mental status with intermittently refusing dialysis. Patient's family have decided to continue hemodialysis after speaking with their PCP. Hospital course prolonged, complicated by patient's inability to tolerate sitting in recliner chair for dialysis.  She only agrees to dialysis in the bed.  This is a barrier to discharge as no outpatient dialysis centers can accommodate.       5/3: titrating up on fentanyl patch 12.5 --> 25 mcg, seems to be helping, if still unable to sit up in chair  may consider increasing dose further    5/14-patient back pain seems to be well-controlled now.  Caseworkers have discussed the case today and we will likely involve ethics committee/Hospital administration.   Consultants:  Neurosurgery Infectious Disease Vascular surgery  Nephrology    Procedures: 09/22/22: Stent placement to the AV graft  10/13/22: CT aspiration of right psoas muscle abscess yielding 2 ml purulent material  11/11/22: R Femoral V catheter placement 11/16/22: Permcath placement to L IJ        ASSESSMENT & PLAN:   Principal Problem:   Back pain Active Problems:   Anemia   Bilateral leg pain   ESRD on dialysis [facility])   Essential hypertension   Dyslipidemia   Seizure disorder (HCC)   Diabetic retinopathy without macular edema associated with type 2 diabetes mellitus (HCC)   GERD (gastroesophageal reflux disease)   Diabetic neuropathy (HCC)   Anemia of chronic disease   Insulin-requiring or dependent type II diabetes mellitus (HCC)   Discitis of lumbar region   Myositis   Psoas abscess (HCC)     Lumbar discitis, 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   Wound cx growing staph capitis.   ID consulted.  Abx de-escalated to cefazolin.   Not a good surgical candidate as per neuro surg.   Pt having severe low back and bilateral leg pain and has not been able to tolerate any position except laying nearly flat in the bed Has completed 6 weeks of IV cefazolin,  End Date: 12/02/22 pain management  per orders -- palliative care following peripherally Fentanyl patch was uptitrated  will trial up in chair as tolerated We will continue discussion of goals of care with patient's family    Confusion and agitation - improved Hospital delirium  Pt was agitated, and also not responding to questions or commands during  early hospitalization.  Mental status did start to improve, and now pt is calm and interactive, however, at this time she has been having waxing and waning mental status Delirium precautions cont seroquel 50 mg nightly to help with delirium and sleep.   ESRD: on HD MWF.  Pt refused HD on 10/17/22, 10/18/22 and 3/27, 4/1.   When attempts made to do HD in chair, she did not tolerate on account of severe back pain Recommend consideration for home hemodialysis if no facility capable to do HD in the bed. or discharge to dialysis capable skilled nursing facility   Generalized weakness / Physical Debility:  PT recs SNF. TOC following. Pt is bedridden and very high risk for developing pressure injuries of skin -- reposition frequently.   Monitor pressures areas on skin and offload as possible.   Severe back and bilateral leg pain due to severe multi-level lumbar degenerative disc disease. Pt has bulging discs at L2-3, L3-4, L4-5 and L5-S1. Pt will continue to have back and leg pain once antibiotics are complete. Pain mgmt per orders  Pain control complicated by AMS and hypotension with escalation - titrating up on fentanyl patch slowly, cautious use of IV fentanyl for breakthrough pain    ACD:  likely secondary ESRD. H&H are stable  Monitor periodic CBC   Hypokalemia-improved Replace as needed Monitor BMP   Peripheral neuropathy --  Continue low dose gabapentin and monitor   Hx of seizures:  continue on home dose of Keppra   HLD:  continue on statin    Hx of COPD: w/o exacerbation.  Continue on bronchodilators    DM2: well controlled HbA1c 5.2.  d/c'ed BG checks    HTN:  Antihypertensives as blood pressure allows   GERD:  continue on PPI    Depression: severity unknown.  Continue on home dose of sertraline     Overweight: BMI 28.2.  Wt loss efforts recommended   Right foot pain  see peripheral neuropathy     DVT prophylaxis: heparin sq     Code Status: DNR      Current Admission Status: inpatient  TOC needs / Dispo plan: SNF Barriers to discharge / significant pending items: unable to sit to toelrate outpatient HD, working w/ PT    Laboratory data: No laboratory results available today for review   Family Communication: I discussed the case with patient's daughter [name] on 12/06/2022   Physical Exam Constitutional:      General: She is not in acute distress.    Appearance: She is obese.  Cardiovascular:     Rate and Rhythm: Normal rate and regular rhythm.  Pulmonary:     Effort: Pulmonary effort is normal. No respiratory distress.     Breath sounds: Normal breath sounds.  Neurological:     General: Appears generally lethargic with some weakness noted to bilateral lower extremities.  Right greater than left    Mental Status: She is alert. Mental status is at baseline.     Time spent: 45 minutes       Vitals:   12/05/22 1710 12/05/22 2126 12/06/22 0602 12/06/22 0911  BP: (!) 113/59 121/66 (!) 143/70 128/72  Pulse: 78 82  79 85  Resp: 17 18 17 18   Temp: 97.6 F (36.4 C) 98.6 F (37 C) 98.4 F (36.9 C) 97.8 F (36.6 C)  TempSrc:  Oral Oral   SpO2: 98% 100% 94% 98%  Weight:      Height:        Author: Loyce Dys, MD 12/06/2022 4:24 PM  For on call review [email or web address].

## 2022-12-07 DIAGNOSIS — E1142 Type 2 diabetes mellitus with diabetic polyneuropathy: Secondary | ICD-10-CM | POA: Diagnosis not present

## 2022-12-07 DIAGNOSIS — M79604 Pain in right leg: Secondary | ICD-10-CM | POA: Diagnosis not present

## 2022-12-07 DIAGNOSIS — D649 Anemia, unspecified: Secondary | ICD-10-CM | POA: Diagnosis not present

## 2022-12-07 DIAGNOSIS — M5442 Lumbago with sciatica, left side: Secondary | ICD-10-CM | POA: Diagnosis not present

## 2022-12-07 LAB — CBC WITH DIFFERENTIAL/PLATELET
Abs Immature Granulocytes: 0.03 10*3/uL (ref 0.00–0.07)
Basophils Absolute: 0 10*3/uL (ref 0.0–0.1)
Basophils Relative: 1 %
Eosinophils Absolute: 0.1 10*3/uL (ref 0.0–0.5)
Eosinophils Relative: 2 %
HCT: 28.2 % — ABNORMAL LOW (ref 36.0–46.0)
Hemoglobin: 8.6 g/dL — ABNORMAL LOW (ref 12.0–15.0)
Immature Granulocytes: 1 %
Lymphocytes Relative: 44 %
Lymphs Abs: 2.5 10*3/uL (ref 0.7–4.0)
MCH: 25.4 pg — ABNORMAL LOW (ref 26.0–34.0)
MCHC: 30.5 g/dL (ref 30.0–36.0)
MCV: 83.4 fL (ref 80.0–100.0)
Monocytes Absolute: 0.7 10*3/uL (ref 0.1–1.0)
Monocytes Relative: 12 %
Neutro Abs: 2.2 10*3/uL (ref 1.7–7.7)
Neutrophils Relative %: 40 %
Platelets: 213 10*3/uL (ref 150–400)
RBC: 3.38 MIL/uL — ABNORMAL LOW (ref 3.87–5.11)
RDW: 18.8 % — ABNORMAL HIGH (ref 11.5–15.5)
WBC: 5.6 10*3/uL (ref 4.0–10.5)
nRBC: 0 % (ref 0.0–0.2)

## 2022-12-07 LAB — BASIC METABOLIC PANEL
Anion gap: 10 (ref 5–15)
BUN: 28 mg/dL — ABNORMAL HIGH (ref 8–23)
CO2: 28 mmol/L (ref 22–32)
Calcium: 9.5 mg/dL (ref 8.9–10.3)
Chloride: 96 mmol/L — ABNORMAL LOW (ref 98–111)
Creatinine, Ser: 5.95 mg/dL — ABNORMAL HIGH (ref 0.44–1.00)
GFR, Estimated: 7 mL/min — ABNORMAL LOW (ref >60)
Glucose, Bld: 92 mg/dL (ref 70–99)
Potassium: 3.7 mmol/L (ref 3.5–5.1)
Sodium: 134 mmol/L — ABNORMAL LOW (ref 135–145)

## 2022-12-07 MED ORDER — HEPARIN SODIUM (PORCINE) 1000 UNIT/ML IJ SOLN
INTRAMUSCULAR | Status: AC
  Filled 2022-12-07: qty 10

## 2022-12-07 MED ORDER — EPOETIN ALFA 10000 UNIT/ML IJ SOLN
INTRAMUSCULAR | Status: AC
  Filled 2022-12-07: qty 1

## 2022-12-07 MED ORDER — EPOETIN ALFA 10000 UNIT/ML IJ SOLN
10000.0000 [IU] | INTRAMUSCULAR | Status: DC
  Administered 2022-12-07 – 2023-01-03 (×12): 10000 [IU] via INTRAVENOUS
  Filled 2022-12-07 (×11): qty 1

## 2022-12-07 NOTE — Discharge Planning (Signed)
Continuing to follow for outpatient dialysis discharge planning. Looking into home hemodialysis, to see whether or not this is a viable plan.

## 2022-12-07 NOTE — Progress Notes (Signed)
[facility]  PROGRESS NOTE   Subjective:   Seen and examined on hemodialysis treatment. Patient in bed.     HEMODIALYSIS FLOWSHEET:  Blood Flow Rate (mL/min): 400 mL/min Arterial Pressure (mmHg): -230 mmHg Venous Pressure (mmHg): 280 mmHg TMP (mmHg): 8 mmHg Ultrafiltration Rate (mL/min): 862 mL/min Dialysate Flow Rate (mL/min): 300 ml/min Dialysis Fluid Bolus: Normal Saline Bolus Amount (mL): 500 mL   Objective:  Vital signs: Blood pressure 113/63, pulse 74, temperature 98.7 F (37.1 C), temperature source Other (Comment), resp. rate 13, height 6' (1.829 m), weight 90.1 kg, SpO2 99 %.  Intake/Output Summary (Last 24 hours) at 12/07/2022 1002 Last data filed at 12/06/2022 1923 Gross per 24 hour  Intake 220 ml  Output --  Net 220 ml    Filed Weights   12/05/22 1021 12/05/22 1413 12/07/22 0830  Weight: 90.6 kg 88.9 kg 90.1 kg     Physical Exam: General:  No acute distress, laying in bed  Head:  Normocephalic, atraumatic. Moist oral mucosal membranes  Eyes:  Anicteric  Neck:  Supple  Lungs:   Clear to auscultation, normal effort  Heart:  regular  Abdomen:   Soft, nontender, bowel sounds present  Extremities:  peripheral edema.  Neurologic:  Awake, alert, following commands  Skin:  No lesions  Access: LIJ permcath    Basic Metabolic Panel: Recent Labs  Lab 12/02/22 0551 12/03/22 0550 12/04/22 0618 12/05/22 1037 12/07/22 0416  NA 133* 133* 134* 136 134*  K 3.8 3.2* 4.0 3.9 3.7  CL 96* 95* 98 98 96*  CO2 29 28 28 27 28   GLUCOSE 111* 116* 111* 105* 92  BUN 38* 20 31* 43* 28*  CREATININE 5.58* 3.62* 5.23* 6.85* 5.95*  CALCIUM 9.7 9.2 10.1 10.0 9.5  PHOS  --   --   --  5.8*  --     GFR: Estimated Creatinine Clearance: 10.9 mL/min (A) (by C-G formula based on SCr of 5.95 mg/dL (H)).  Liver Function Tests: Recent Labs  Lab 12/05/22 1037  ALBUMIN 2.2*    No results for input(s): "LIPASE", "AMYLASE" in the last 168 hours. No results for  input(s): "AMMONIA" in the last 168 hours.  CBC: Recent Labs  Lab 12/02/22 0843 12/05/22 1037 12/07/22 0416  WBC 6.0 5.5 5.6  NEUTROABS  --   --  2.2  HGB 8.4* 8.6* 8.6*  HCT 28.7* 28.7* 28.2*  MCV 84.2 83.7 83.4  PLT 219 221 213      HbA1C: Hemoglobin A1C  Date/Time Value Ref Range Status  06/15/2014 12:58 AM 7.9 (H) 4.2 - 6.3 % Final    Comment:    The American Diabetes Association recommends that a primary goal of therapy should be <7% and that physicians should reevaluate the treatment regimen in patients with HbA1c values consistently >8%.   12/24/2013 11:18 AM 8.2 (H) 4.2 - 6.3 % Final    Comment:    The American Diabetes Association recommends that a primary goal of therapy should be <7% and that physicians should reevaluate the treatment regimen in patients with HbA1c values consistently >8%.    Hgb A1c MFr Bld  Date/Time Value Ref Range Status  10/13/2022 11:46 AM 5.2 4.8 - 5.6 % Final    Comment:    (NOTE)         Prediabetes: 5.7 - 6.4         Diabetes: >6.4         Glycemic control for adults with diabetes: <7.0  03/23/2022 06:47 AM 5.8 (H) 4.8 - 5.6 % Final    Comment:    (NOTE) Pre diabetes:          5.7%-6.4%  Diabetes:              >6.4%  Glycemic control for   <7.0% adults with diabetes     Urinalysis: No results for input(s): "COLORURINE", "LABSPEC", "PHURINE", "GLUCOSEU", "HGBUR", "BILIRUBINUR", "KETONESUR", "PROTEINUR", "UROBILINOGEN", "NITRITE", "LEUKOCYTESUR" in the last 72 hours.  Invalid input(s): "APPERANCEUR"    Imaging: No results found.   Medications:    sodium chloride Stopped (11/16/22 1155)   anticoagulant sodium citrate     anticoagulant sodium citrate      (feeding supplement) PROSource Plus  30 mL Oral BID BM   cephALEXin  500 mg Oral QHS   Chlorhexidine Gluconate Cloth  6 each Topical Q0600   Chlorhexidine Gluconate Cloth  6 each Topical Q0600   cinacalcet  30 mg Oral Q supper   epoetin (EPOGEN/PROCRIT)  injection  4,000 Units Intravenous Q M,W,F-HD   feeding supplement (NEPRO CARB STEADY)  237 mL Oral TID BM   fentaNYL  1 patch Transdermal Q72H   gabapentin  100 mg Oral Once per day on Mon Wed Fri   Gerhardt's butt cream   Topical QID   heparin injection (subcutaneous)  5,000 Units Subcutaneous Q8H   levETIRAcetam  1,000 mg Oral Daily   levETIRAcetam  250 mg Oral Q M,W,F   metoprolol succinate  50 mg Oral Daily   pantoprazole  20 mg Oral BID   psyllium  1 packet Oral Daily   QUEtiapine  25 mg Oral QHS   sertraline  25 mg Oral Daily   sodium chloride flush  10-40 mL Intracatheter Q12H   umeclidinium-vilanterol  1 puff Inhalation Daily    Assessment/ Plan:     [name] is a 72 y.o. female with end stage renal disease on hemodialysis, CAD, COPD with intermittent home O2, hypertension, hyperlipidemia, stroke, diabetes who has been admitted since 3/20. Initially admitted for vertebral osteomyelitis. She has completed IV antibiotics. Now is working on placement.   [facility] LIJ permcath   #1 ESRD on hemodialysis.    Continue MWF schedule.  - looking for home modalities for outpatient placement.  - patient needs to be able to be seated in a chair for her dialysis treatments. Patient made aware.    #2: Anemia with chronic kidney disease - increase epo dose to 10000 units q treatment.    #3: Second hyperparathyroidism: phos and calcium acceptable.  - check PTH    #4: Hypertension: due to hypotensive events, discontinue metoprolol.    LOS: 56 Lamont Dowdy, MD [facility] 5/15/202410:02 AM

## 2022-12-07 NOTE — Progress Notes (Signed)
Hemodialysis note  Received patient in bed to unit. Alert and oriented.  Informed consent signed and in chart.  Treatment initiated: 1610 Treatment completed: 1200  Patient tolerated well. Transported back to room, alert without acute distress.  Report given to patient's RN.   Access used: Left Chest HD PermCath Access issues: none  Total UF removed: 1.9L Medication(s) given:  Epogen 10 000 units  Post HD weight: 89 kg   [facility]

## 2022-12-07 NOTE — Progress Notes (Signed)
Nutrition Follow-up  DOCUMENTATION CODES:   Not applicable  INTERVENTION:   -Continue renal MVI daily -Continue 30 ml Prosource Plus BID, each supplement provides 100 kcals and 15 grams protein -Continue Nepro Shake po TID, each supplement provides 425 kcal and 19 grams protein  -Continue 2 gram sodium diet for wider variety of meal selections  NUTRITION DIAGNOSIS:   Increased nutrient needs related to chronic illness (ESRD on HD) as evidenced by estimated needs.  Ongoing  GOAL:   Patient will meet greater than or equal to 90% of their needs  Progressing   MONITOR:   PO intake, Supplement acceptance  REASON FOR ASSESSMENT:   Malnutrition Screening Tool    ASSESSMENT:   Pt with PMH of ESRD on HD, HLD, COPD, DM2, HTN, depression, CVA who presents with back pain x 3 weeks PTA.  4/19- triple lumen HD cath placed secondary to clotted AVG  4/24- tunneled HD cath placed via lt internal jugular vein  Reviewed I/O's: +220 ml x 24 hours and +600 ml since 11/23/22   Pt out of room at time of visit. No family present. Pt currently in HD suite.   Pt with variable oral intake. Noted meal completions 0-100%. Pt also inconsistent with taking supplements (noted supplements are sometimes skipped when pt in HD).   Wt has been stable over the past month.   Palliative care continues to follow for goals of care. Pt continues to intermittently make comments about not continuing HD. HD remains a barrier to discharge, as pt unable to tolerate HD in a chair secondary to back pain. Per MD notes, home HD is being investigated.   TOC continues to follow for placement. Ethics consult pending. Also lookng into LTACH for stretcher HD.   Medications reviewed and include sensipar, epogen, keppra, and metamucil.   Labs reviewed: Na: 134, Phos: 5.8, CBGS: 131.   Diet Order:   Diet Order             Diet 2 gram sodium Fluid consistency: Thin  Diet effective now                    EDUCATION NEEDS:   No education needs have been identified at this time  Skin:  Skin Assessment: Skin Integrity Issues: Skin Integrity Issues:: Other (Comment) Other: IAD to lt buttocks  Last BM:  12/06/22 (type 6)  Height:   Ht Readings from Last 1 Encounters:  10/12/22 6' (1.829 m)    Weight:   Wt Readings from Last 1 Encounters:  12/07/22 90.1 kg    Ideal Body Weight:  72.7 kg  BMI:  Body mass index is 26.94 kg/m.  Estimated Nutritional Needs:   Kcal:  2150-2350  Protein:  105-120 grams  Fluid:  1000 ml + UOP    Levada Schilling, RD, LDN, CDCES Registered Dietitian II Certified Diabetes Care and Education Specialist Please refer to [facility] for RD and/or RD on-call/weekend/after hours pager

## 2022-12-07 NOTE — TOC Progression Note (Signed)
Transition of Care [facility]) - Progression Note    Patient Details  Name: Cynthia Dean MRN: [id number] Date of Birth: 26-Jan-1951  [facility]) CM/SW Contact  Allena Katz, LCSW Phone Number: 12/07/2022, 9:08 AM  Clinical Narrative:   CSW reached back out to [place] with [facility]. Candise Bowens reports pt will be difficult to place due to the complexity of her situation and that they cannot accommodate her if unable to sit for dialysis. Ethics consult has been ordered for patient.      Expected Discharge Plan and Services                                               Social Determinants of Health (SDOH) Interventions SDOH Screenings   Food Insecurity: No Food Insecurity (10/12/2022)  Housing: Low Risk  (10/12/2022)  Transportation Needs: No Transportation Needs (10/12/2022)  Utilities: Not At Risk (10/12/2022)  Financial Resource Strain: Low Risk  (11/12/2017)  Physical Activity: Insufficiently Active (11/12/2017)  Social Connections: Moderately Integrated (11/12/2017)  Stress: No Stress Concern Present (11/12/2017)  Tobacco Use: Medium Risk (11/16/2022)    Readmission Risk Interventions     No data to display

## 2022-12-07 NOTE — Progress Notes (Signed)
PROGRESS NOTE    Cynthia Dean   ZOX:[id number] DOB: [birth year]  DOA: 10/12/2022 Date of Service: 12/07/22 PCP: Loistine Chance, MD     Brief Narrative / Hospital Course:  Cynthia Dean is a 72 y.o. 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 She presents to the ED with worsening lower extremity pain. Admitted for possible Discitis/osteomyelitis at L4-L5: w/ suspected intradiscal abscess as per MRI. 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   4/18, HD access / fistula clotted off during dialysis. 4/19 pt taken for procedure to address fistula malfunction, but was agitated and combative, resfused procedure. 4/19 vascular placed R femoral temp HD cath placed, resumed HD and pemcath placed 4/24  Patient has had waxing and waning mental status with intermittently refusing dialysis. Patient's family have decided to continue hemodialysis after speaking with their PCP. Hospital course prolonged, complicated by patient's inability to tolerate sitting in recliner chair for dialysis.  She only agrees to dialysis in the bed.  This is a barrier to discharge as no outpatient dialysis centers can accommodate.      5/3: titrating up on fentanyl patch 12.5 --> 25 mcg, seems to be helping, if still unable to sit up in chair may consider increasing dose further   5/14-patient back pain seems to be well-controlled.  Caseworkers have discussed the case, will likely involve ethics committee/Hospital administration.     Consultants:  Neurosurgery Infectious Disease Vascular surgery  Nephrology   Procedures: 09/22/22: Stent placement to the AV graft  10/13/22: CT aspiration of right psoas muscle abscess yielding 2 ml purulent material  11/11/22: R Femoral V catheter placement 11/16/22: Permcath  placement to L IJ       ASSESSMENT & PLAN:   Principal Problem:   Back pain Active Problems:   Anemia   Bilateral leg pain   ESRD on dialysis [facility])   Essential hypertension   Dyslipidemia   Seizure disorder (HCC)   Diabetic retinopathy without macular edema associated with type 2 diabetes mellitus (HCC)   GERD (gastroesophageal reflux disease)   Diabetic neuropathy (HCC)   Anemia of chronic disease   Insulin-requiring or dependent type II diabetes mellitus (HCC)   Discitis of lumbar region   Myositis   Psoas abscess (HCC)   Lumbar discitis, 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   Wound cx growing staph capitis.   ID consulted.  Abx de-escalated to cefazolin.   Not a good surgical candidate as per neuro surg.   Pt having severe low back and bilateral leg pain and has not been able to tolerate any position except laying nearly flat in the bed cont cefazolin, for 6 weeks, End Date: 12/02/22 pain management per orders -- palliative care following peripherally 5/3: titrate up on fentanyl patch 12.5 --> 25 mcg - pt reports pain is improved 5/6, will trial up in chair    Confusion and agitation - improved Hospital delirium  Pt was agitated, and also not responding to questions or commands during early hospitalization.  Mental status did start to improve, and now pt is calm and interactive, however, at this time she has been having waxing and waning mental status Delirium precautions cont seroquel 50 mg nightly to  help with delirium and sleep.   ESRD: on HD MWF.  Pt refused HD on 10/17/22, 10/18/22 and 3/27, 4/1.   When attempts made to do HD in chair, she did not tolerate on account of severe back pain Recommend consideration for home hemodialysis if no facility capable to do HD in the bed Pt has voiced that she does not  want dialysis and has been intermittently refusing this. Family encouraged her to complete HD. Ethics consult placed to discuss appropriate decision-making.    Generalized weakness / Physical Debility:  PT recs SNF. TOC following. Pt is bedridden and very high risk for developing pressure injuries of skin -- reposition frequently.   Monitor pressures areas on skin and offload as possible.   Severe back and bilateral leg pain due to severe multi-level lumbar degenerative disc disease. Pt has bulging discs at L2-3, L3-4, L4-5 and L5-S1. Pt will continue to have back and leg pain once antibiotics are complete. Pain mgmt per orders  Pain control complicated by AMS and hypotension with escalation - titrated up on fentanyl patch slowly and 25 mcg seems to be helpful, cautious use of IV fentanyl for breakthrough pain    ACD:  likely secondary ESRD. H&H are stable  Monitor periodic CBC   Hypokalemia Replace as needed Monitor BMP   Peripheral neuropathy --  Continue low dose gabapentin and monitor   Hx of seizures:  continue on home dose of Keppra   HLD:  continue on statin    Hx of COPD: w/o exacerbation.  Continue on bronchodilators    DM2: well controlled HbA1c 5.2.  d/c'ed BG checks    HTN:  Antihypertensives as blood pressure allows   GERD:  continue on PPI    Depression: severity unknown.  Continue on home dose of sertraline     Overweight: BMI 28.2.  Wt loss efforts recommended   Right foot pain  see peripheral neuropathy   DVT prophylaxis: heparin sq Pertinent IV fluids/nutrition: no continuous IV fluids  Central lines / invasive devices: L IJ HD cath, port R chest   Code Status: DNR ACP documentation reviewed: 05/01 - MOST and advanced directive reviewed, [name] is HCPOA  Current Admission Status: inpatient  TOC needs / Dispo plan: SNF Barriers to discharge / significant pending items: unable to sit to toelrate outpatient HD, working w/ PT, TOC  involved             Subjective / Brief ROS:  No concerns, pt resting comfortably    Family Communication: none at this time     Objective Findings:  Vitals:   12/07/22 1152 12/07/22 1155 12/07/22 1200 12/07/22 1230  BP: (!) 89/61 93/63 99/66    Pulse: 75 75 77 77  Resp: 15 17 15 17   Temp:   98.5 F (36.9 C)   TempSrc:   Oral   SpO2: 100% 100% 100% 100%  Weight:   89 kg   Height:        Intake/Output Summary (Last 24 hours) at 12/07/2022 1445 Last data filed at 12/07/2022 1200 Gross per 24 hour  Intake 220 ml  Output 1901 ml  Net -1681 ml   Filed Weights   12/05/22 1413 12/07/22 0830 12/07/22 1200  Weight: 88.9 kg 90.1 kg 89 kg    Examination:  Physical Exam Constitutional:      General: She is not in acute distress.    Appearance: Normal appearance. She is obese.  Cardiovascular:     Rate and Rhythm:  Normal rate and regular rhythm.  Pulmonary:     Effort: Pulmonary effort is normal.     Breath sounds: Normal breath sounds.  Neurological:     Mental Status: Mental status is at baseline.          Scheduled Medications:   (feeding supplement) PROSource Plus  30 mL Oral BID BM   cephALEXin  500 mg Oral QHS   Chlorhexidine Gluconate Cloth  6 each Topical Q0600   Chlorhexidine Gluconate Cloth  6 each Topical Q0600   cinacalcet  30 mg Oral Q supper   epoetin (EPOGEN/PROCRIT) injection  10,000 Units Intravenous Q M,W,F-HD   feeding supplement (NEPRO CARB STEADY)  237 mL Oral TID BM   fentaNYL  1 patch Transdermal Q72H   gabapentin  100 mg Oral Once per day on Mon Wed Fri   Gerhardt's butt cream   Topical QID   heparin injection (subcutaneous)  5,000 Units Subcutaneous Q8H   levETIRAcetam  1,000 mg Oral Daily   levETIRAcetam  250 mg Oral Q M,W,F   pantoprazole  20 mg Oral BID   psyllium  1 packet Oral Daily   QUEtiapine  25 mg Oral QHS   sertraline  25 mg Oral Daily   sodium chloride flush  10-40 mL Intracatheter Q12H    umeclidinium-vilanterol  1 puff Inhalation Daily    Continuous Infusions:  sodium chloride Stopped (11/16/22 1155)   anticoagulant sodium citrate      PRN Medications:  alteplase, anticoagulant sodium citrate, fentaNYL (SUBLIMAZE) injection, guaiFENesin, hydrALAZINE, lidocaine (PF), lidocaine-prilocaine, LORazepam, ondansetron, phenol, senna-docusate, sodium chloride flush  Antimicrobials from admission:  Anti-infectives (From admission, onward)    Start     Dose/Rate Route Frequency Ordered Stop   12/05/22 2200  cephALEXin (KEFLEX) capsule 500 mg        500 mg Oral Daily at bedtime 12/05/22 1530 01/02/23 2159   11/18/22 1200  ceFAZolin (ANCEF) IVPB 3g/100 mL premix        3 g 200 mL/hr over 30 Minutes Intravenous Every Fri (Hemodialysis) 11/13/22 1039 12/02/22 1224   11/17/22 0000  ceFAZolin (ANCEF) IVPB 1 g/50 mL premix       Note to Pharmacy: Send with pt to OR   1 g 100 mL/hr over 30 Minutes Intravenous On call 11/16/22 1154 11/16/22 1500   11/16/22 1200  ceFAZolin (ANCEF) IVPB 2g/100 mL premix        2 g 200 mL/hr over 30 Minutes Intravenous Every Wed (Hemodialysis) 11/13/22 1039 11/30/22 1723   11/14/22 1200  ceFAZolin (ANCEF) IVPB 2g/100 mL premix        2 g 200 mL/hr over 30 Minutes Intravenous Every Mon (Hemodialysis) 11/13/22 1039 11/28/22 1151   11/11/22 2200  ceFAZolin (ANCEF) IVPB 1 g/50 mL premix        1 g 100 mL/hr over 30 Minutes Intravenous Daily at 10 pm 11/10/22 1655 11/13/22 2320   11/09/22 1200  ceFAZolin (ANCEF) IVPB 2g/100 mL premix  Status:  Discontinued        2 g 200 mL/hr over 30 Minutes Intravenous Every Wed (Hemodialysis) 11/02/22 1246 11/03/22 1111   11/07/22 1700  ceFAZolin (ANCEF) IVPB 2g/100 mL premix        2 g 200 mL/hr over 30 Minutes Intravenous  Once 11/07/22 1610 11/07/22 1827   11/07/22 1200  ceFAZolin (ANCEF) IVPB 2g/100 mL premix  Status:  Discontinued        2 g 200 mL/hr over 30 Minutes Intravenous Every Mon (  Hemodialysis) 11/02/22  1246 11/10/22 1655   11/04/22 1200  ceFAZolin (ANCEF) IVPB 3g/100 mL premix  Status:  Discontinued        3 g 200 mL/hr over 30 Minutes Intravenous Every Fri (Hemodialysis) 11/02/22 1246 11/10/22 1655   11/02/22 2200  ceFAZolin (ANCEF) IVPB 1 g/50 mL premix  Status:  Discontinued        1 g 100 mL/hr over 30 Minutes Intravenous Daily at bedtime 11/01/22 1452 11/02/22 1246   11/02/22 1330  ceFAZolin (ANCEF) IVPB 2g/100 mL premix  Status:  Discontinued        2 g 200 mL/hr over 30 Minutes Intravenous Every Wed (Hemodialysis) 11/02/22 1320 11/10/22 1655   11/02/22 1200  ceFAZolin (ANCEF) IVPB 2g/100 mL premix  Status:  Discontinued        2 g 200 mL/hr over 30 Minutes Intravenous Every Wed (Hemodialysis) 10/28/22 1004 11/01/22 1452   10/31/22 1200  ceFAZolin (ANCEF) IVPB 2g/100 mL premix  Status:  Discontinued        2 g 200 mL/hr over 30 Minutes Intravenous Every Mon (Hemodialysis) 10/28/22 1004 11/01/22 1452   10/28/22 1200  ceFAZolin (ANCEF) IVPB 3g/100 mL premix  Status:  Discontinued        3 g 200 mL/hr over 30 Minutes Intravenous Every Fri (Hemodialysis) 10/28/22 1004 11/01/22 1452   10/19/22 2200  ceFAZolin (ANCEF) IVPB 1 g/50 mL premix  Status:  Discontinued        1 g 100 mL/hr over 30 Minutes Intravenous Every 24 hours 10/18/22 0834 10/28/22 1004   10/18/22 1000  ceFAZolin (ANCEF) IVPB 2g/100 mL premix        2 g 200 mL/hr over 30 Minutes Intravenous  Once 10/18/22 0834 10/18/22 0946   10/17/22 1400  ceFEPIme (MAXIPIME) 1 g in sodium chloride 0.9 % 100 mL IVPB  Status:  Discontinued        1 g 200 mL/hr over 30 Minutes Intravenous Every 24 hours 10/17/22 1252 10/17/22 1520   10/14/22 1200  vancomycin (VANCOCIN) IVPB 1000 mg/200 mL premix  Status:  Discontinued        1,000 mg 200 mL/hr over 60 Minutes Intravenous Every M-W-F (Hemodialysis) 10/13/22 1356 10/19/22 0809   10/13/22 1400  ceFEPIme (MAXIPIME) 1 g in sodium chloride 0.9 % 100 mL IVPB  Status:  Discontinued        1  g 200 mL/hr over 30 Minutes Intravenous Every 24 hours 10/13/22 1140 10/17/22 1223   10/13/22 1230  vancomycin (VANCOREADY) IVPB 2000 mg/400 mL        2,000 mg 200 mL/hr over 120 Minutes Intravenous  Once 10/13/22 1140 10/13/22 1437   10/12/22 1645  vancomycin (VANCOREADY) IVPB 1500 mg/300 mL  Status:  Discontinued       See Hyperspace for full Linked Orders Report.   1,500 mg 150 mL/hr over 120 Minutes Intravenous  Once 10/12/22 1110 10/12/22 1753   10/12/22 1600  ceFEPIme (MAXIPIME) 1 g in sodium chloride 0.9 % 100 mL IVPB  Status:  Discontinued        1 g 200 mL/hr over 30 Minutes Intravenous Every 24 hours 10/12/22 1112 10/12/22 1753   10/12/22 1500  vancomycin (VANCOCIN) IVPB 1000 mg/200 mL premix  Status:  Discontinued       See Hyperspace for full Linked Orders Report.   1,000 mg 200 mL/hr over 60 Minutes Intravenous  Once 10/12/22 1110 10/12/22 1753   10/12/22 1115  ceFEPIme (MAXIPIME) 1 g in sodium chloride  0.9 % 100 mL IVPB  Status:  Discontinued        1 g 200 mL/hr over 30 Minutes Intravenous  Once 10/12/22 1110 10/12/22 1112           Data Reviewed:  I have personally reviewed the following...  CBC: Recent Labs  Lab 12/02/22 0843 12/05/22 1037 12/07/22 0416  WBC 6.0 5.5 5.6  NEUTROABS  --   --  2.2  HGB 8.4* 8.6* 8.6*  HCT 28.7* 28.7* 28.2*  MCV 84.2 83.7 83.4  PLT 219 221 213   Basic Metabolic Panel: Recent Labs  Lab 12/02/22 0551 12/03/22 0550 12/04/22 0618 12/05/22 1037 12/07/22 0416  NA 133* 133* 134* 136 134*  K 3.8 3.2* 4.0 3.9 3.7  CL 96* 95* 98 98 96*  CO2 29 28 28 27 28   GLUCOSE 111* 116* 111* 105* 92  BUN 38* 20 31* 43* 28*  CREATININE 5.58* 3.62* 5.23* 6.85* 5.95*  CALCIUM 9.7 9.2 10.1 10.0 9.5  PHOS  --   --   --  5.8*  --    GFR: Estimated Creatinine Clearance: 10.9 mL/min (A) (by C-G formula based on SCr of 5.95 mg/dL (H)). Liver Function Tests: Recent Labs  Lab 12/05/22 1037  ALBUMIN 2.2*   No results for input(s):  "LIPASE", "AMYLASE" in the last 168 hours. No results for input(s): "AMMONIA" in the last 168 hours. Coagulation Profile: No results for input(s): "INR", "PROTIME" in the last 168 hours. Cardiac Enzymes: No results for input(s): "CKTOTAL", "CKMB", "CKMBINDEX", "TROPONINI" in the last 168 hours. BNP (last 3 results) No results for input(s): "PROBNP" in the last 8760 hours. HbA1C: No results for input(s): "HGBA1C" in the last 72 hours. CBG: No results for input(s): "GLUCAP" in the last 168 hours. Lipid Profile: No results for input(s): "CHOL", "HDL", "LDLCALC", "TRIG", "CHOLHDL", "LDLDIRECT" in the last 72 hours. Thyroid Function Tests: No results for input(s): "TSH", "T4TOTAL", "FREET4", "T3FREE", "THYROIDAB" in the last 72 hours. Anemia Panel: No results for input(s): "VITAMINB12", "FOLATE", "FERRITIN", "TIBC", "IRON", "RETICCTPCT" in the last 72 hours. Most Recent Urinalysis On File:     Component Value Date/Time   COLORURINE YELLOW (A) 03/20/2022 0125   APPEARANCEUR CLEAR (A) 03/20/2022 0125   APPEARANCEUR Clear 06/14/2014 1510   LABSPEC 1.010 03/20/2022 0125   LABSPEC 1.006 06/14/2014 1510   PHURINE 7.0 03/20/2022 0125   GLUCOSEU 50 (A) 03/20/2022 0125   GLUCOSEU >=500 06/14/2014 1510   HGBUR MODERATE (A) 03/20/2022 0125   BILIRUBINUR NEGATIVE 03/20/2022 0125   BILIRUBINUR Negative 06/14/2014 1510   KETONESUR NEGATIVE 03/20/2022 0125   PROTEINUR 100 (A) 03/20/2022 0125   NITRITE NEGATIVE 03/20/2022 0125   LEUKOCYTESUR NEGATIVE 03/20/2022 0125   LEUKOCYTESUR Negative 06/14/2014 1510   Sepsis Labs: @LABRCNTIP (procalcitonin:4,lacticidven:4) Microbiology: No results found for this or any previous visit (from the past 240 hour(s)).    Radiology Studies last 3 days: No results found.           LOS: 56 days      Sunnie Nielsen, DO [facility] 12/07/2022, 2:45 PM    Dictation software may have been used to generate the above note. Typos may occur  and escape review in typed/dictated notes. Please contact Dr Lyn Hollingshead directly for clarity if needed.  Staff may message me via secure chat in Epic  but this may not receive an immediate response,  please page me for urgent matters!  If 7PM-7AM, please contact night coverage [email or web address]

## 2022-12-07 NOTE — Progress Notes (Signed)
Palliative Medicine    Medical records reviewed including progress notes, labs, imaging. No acute changes.    Barrie Folk MS Ed.S, RN Palliative Medicine Team Phone: [phone]

## 2022-12-08 DIAGNOSIS — M79604 Pain in right leg: Secondary | ICD-10-CM | POA: Diagnosis not present

## 2022-12-08 DIAGNOSIS — M5442 Lumbago with sciatica, left side: Secondary | ICD-10-CM | POA: Diagnosis not present

## 2022-12-08 DIAGNOSIS — E1142 Type 2 diabetes mellitus with diabetic polyneuropathy: Secondary | ICD-10-CM | POA: Diagnosis not present

## 2022-12-08 DIAGNOSIS — D649 Anemia, unspecified: Secondary | ICD-10-CM | POA: Diagnosis not present

## 2022-12-08 LAB — CBC WITH DIFFERENTIAL/PLATELET
Abs Immature Granulocytes: 0.03 10*3/uL (ref 0.00–0.07)
Basophils Absolute: 0.1 10*3/uL (ref 0.0–0.1)
Basophils Relative: 1 %
Eosinophils Absolute: 0.1 10*3/uL (ref 0.0–0.5)
Eosinophils Relative: 1 %
HCT: 29.3 % — ABNORMAL LOW (ref 36.0–46.0)
Hemoglobin: 8.9 g/dL — ABNORMAL LOW (ref 12.0–15.0)
Immature Granulocytes: 0 %
Lymphocytes Relative: 34 %
Lymphs Abs: 2.8 10*3/uL (ref 0.7–4.0)
MCH: 25.4 pg — ABNORMAL LOW (ref 26.0–34.0)
MCHC: 30.4 g/dL (ref 30.0–36.0)
MCV: 83.7 fL (ref 80.0–100.0)
Monocytes Absolute: 1 10*3/uL (ref 0.1–1.0)
Monocytes Relative: 13 %
Neutro Abs: 4.1 10*3/uL (ref 1.7–7.7)
Neutrophils Relative %: 51 %
Platelets: 207 10*3/uL (ref 150–400)
RBC: 3.5 MIL/uL — ABNORMAL LOW (ref 3.87–5.11)
RDW: 18.8 % — ABNORMAL HIGH (ref 11.5–15.5)
WBC: 8 10*3/uL (ref 4.0–10.5)
nRBC: 0 % (ref 0.0–0.2)

## 2022-12-08 NOTE — Progress Notes (Signed)
PROGRESS NOTE    Cynthia Dean   JXB:[id number] DOB: 05-31-1951  DOA: 10/12/2022 Date of Service: 12/08/22 PCP: Loistine Chance, MD     Brief Narrative / Hospital Course:  Cynthia Dean is a 72 y.o. 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 She presents to the ED with worsening lower extremity pain. Admitted for possible Discitis/osteomyelitis at L4-L5: w/ suspected intradiscal abscess as per MRI. 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   4/18, HD access / fistula clotted off during dialysis. 4/19 pt taken for procedure to address fistula malfunction, but was agitated and combative, resfused procedure. 4/19 vascular placed R femoral temp HD cath placed, resumed HD and pemcath placed 4/24  Patient has had waxing and waning mental status with intermittently refusing dialysis. Patient's family have decided to continue hemodialysis after speaking with their PCP. Hospital course prolonged, complicated by patient's inability to tolerate sitting in recliner chair for dialysis.  She only agrees to dialysis in the bed.  This is a barrier to discharge as no outpatient dialysis centers can accommodate.      5/3: titrating up on fentanyl patch 12.5 --> 25 mcg, seems to be helping, if still unable to sit up in chair may consider increasing dose further   5/14- back pain seems to be well-controlled.  Caseworkers have discussed the case, will likely involve ethics committee/Hospital administration. Looking into home dialysis.     Consultants:  Neurosurgery Infectious Disease Vascular surgery  Nephrology   Procedures: 09/22/22: Stent placement to the AV graft  10/13/22: CT aspiration of right psoas muscle abscess yielding 2 ml purulent material  11/11/22: R Femoral V catheter  placement 11/16/22: Permcath placement to L IJ       ASSESSMENT & PLAN:   Principal Problem:   Back pain Active Problems:   Anemia   Bilateral leg pain   ESRD on dialysis [facility])   Essential hypertension   Dyslipidemia   Seizure disorder (HCC)   Diabetic retinopathy without macular edema associated with type 2 diabetes mellitus (HCC)   GERD (gastroesophageal reflux disease)   Diabetic neuropathy (HCC)   Anemia of chronic disease   Insulin-requiring or dependent type II diabetes mellitus (HCC)   Discitis of lumbar region   Myositis   Psoas abscess (HCC)   Lumbar discitis, 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   Wound cx growing staph capitis.   ID consulted.  Abx de-escalated to cefazolin.   Not a good surgical candidate as per neuro surg.   Pt having severe low back and bilateral leg pain and has not been able to tolerate any position except laying nearly flat in the bed cont cefazolin, for 6 weeks, End Date: 12/02/22 pain management per orders -- palliative care following peripherally 5/3: titrate up on fentanyl patch 12.5 --> 25 mcg - pt reports pain is improved, but still unable to tolerate sitting in chair for dialysis    Confusion and agitation - improved Hospital delirium  Pt was agitated, and also not responding to questions or commands during early hospitalization.  Mental status did start to improve, and now pt is calm and interactive, however, at this time she has been having waxing and waning mental status  Delirium precautions cont seroquel 50 mg nightly to help with delirium and sleep.   ESRD: on HD MWF.  Pt refused HD on 10/17/22, 10/18/22 and 3/27, 4/1.   When attempts made to do HD in chair, she did not tolerate on account of severe back pain Recommend consideration for home hemodialysis if no facility  capable to do HD in the bed Pt has voiced on occasion that she does not want dialysis and has been intermittently refusing this. Family encouraged her to complete HD.  Ethics consult placed to discuss appropriate decision-making but in my judgment patient lacks capacity to give informed consent/refusal and the appropriate surrogate decision maker is involved (HCPOA is daughter) so while the situation is frustrating we do not have strong evidence that the patient, while lucid, was refusing dialysis consistently.    Generalized weakness / Physical Debility:  PT recs SNF. TOC following. Pt is bedridden and very high risk for developing pressure injuries of skin -- reposition frequently.   Monitor pressures areas on skin and offload as possible.   Severe back and bilateral leg pain due to severe multi-level lumbar degenerative disc disease. Pt has bulging discs at L2-3, L3-4, L4-5 and L5-S1. Pt will continue to have back and leg pain once antibiotics are complete. Pain mgmt per orders  Pain control complicated by AMS and hypotension with escalation - titrated up on fentanyl patch slowly and 25 mcg seems to be helpful, cautious use of IV fentanyl for breakthrough pain    ACD:  likely secondary ESRD. H&H are stable  Monitor periodic CBC   Hypokalemia Replace as needed Monitor BMP   Peripheral neuropathy --  Continue low dose gabapentin and monitor   Hx of seizures:  continue on home dose of Keppra   HLD:  continue on statin    Hx of COPD: w/o exacerbation.  Continue on bronchodilators    DM2: well controlled HbA1c 5.2.  d/c'ed BG checks    HTN:  Antihypertensives as blood pressure allows   GERD:  continue on PPI    Depression: severity unknown.  Continue on home dose of sertraline     Overweight: BMI 28.2.  Wt loss efforts recommended   Right foot pain  see peripheral neuropathy   DVT prophylaxis: heparin sq Pertinent IV fluids/nutrition: no continuous IV fluids   Central lines / invasive devices: L IJ HD cath, port R chest   Code Status: DNR ACP documentation reviewed: 05/01 and 05/16- MOST and advanced directive reviewed, [name] is HCPOA  Current Admission Status: inpatient  TOC needs / Dispo plan: SNF Barriers to discharge / significant pending items: unable to sit to toelrate outpatient HD, working w/ PT, TOC involved             Subjective / Brief ROS:  No concerns, pt resting comfortably  Alert, she complains of pain and doesn't think she can sit up stright in chair   Family Communication: none at this time     Objective Findings:  Vitals:   12/07/22 1230 12/07/22 1945 12/08/22 0349 12/08/22 0823  BP:  (!) 98/52 (!) 106/58 (!) 99/49  Pulse: 77 96 82 80  Resp: 17 18 18 15   Temp:  98.3 F (36.8 C) 97.7 F (36.5 C) (!) 97.5 F (36.4 C)  TempSrc:      SpO2: 100% 94% 94% 94%  Weight:      Height:        Intake/Output Summary (Last 24 hours) at 12/08/2022 1148 Last data  filed at 12/07/2022 1931 Gross per 24 hour  Intake 360 ml  Output 1900 ml  Net -1540 ml   Filed Weights   12/05/22 1413 12/07/22 0830 12/07/22 1200  Weight: 88.9 kg 90.1 kg 89 kg    Examination:  Physical Exam Constitutional:      General: She is not in acute distress.    Appearance: Normal appearance. She is obese.  Cardiovascular:     Rate and Rhythm: Normal rate and regular rhythm.  Pulmonary:     Effort: Pulmonary effort is normal.     Breath sounds: Normal breath sounds.  Neurological:     Mental Status: She is alert. Mental status is at baseline.  Psychiatric:        Mood and Affect: Mood normal.        Behavior: Behavior normal.          Scheduled Medications:   (feeding supplement) PROSource Plus  30 mL Oral BID BM   cephALEXin  500 mg Oral QHS   Chlorhexidine Gluconate Cloth  6 each Topical Q0600   Chlorhexidine Gluconate Cloth  6 each Topical Q0600   cinacalcet  30 mg Oral Q supper   epoetin  (EPOGEN/PROCRIT) injection  10,000 Units Intravenous Q M,W,F-HD   feeding supplement (NEPRO CARB STEADY)  237 mL Oral TID BM   fentaNYL  1 patch Transdermal Q72H   gabapentin  100 mg Oral Once per day on Mon Wed Fri   Gerhardt's butt cream   Topical QID   heparin injection (subcutaneous)  5,000 Units Subcutaneous Q8H   levETIRAcetam  1,000 mg Oral Daily   levETIRAcetam  250 mg Oral Q M,W,F   pantoprazole  20 mg Oral BID   psyllium  1 packet Oral Daily   QUEtiapine  25 mg Oral QHS   sertraline  25 mg Oral Daily   sodium chloride flush  10-40 mL Intracatheter Q12H   umeclidinium-vilanterol  1 puff Inhalation Daily    Continuous Infusions:  sodium chloride Stopped (11/16/22 1155)   anticoagulant sodium citrate      PRN Medications:  alteplase, anticoagulant sodium citrate, fentaNYL (SUBLIMAZE) injection, guaiFENesin, hydrALAZINE, lidocaine (PF), lidocaine-prilocaine, LORazepam, ondansetron, phenol, senna-docusate, sodium chloride flush  Antimicrobials from admission:  Anti-infectives (From admission, onward)    Start     Dose/Rate Route Frequency Ordered Stop   12/05/22 2200  cephALEXin (KEFLEX) capsule 500 mg        500 mg Oral Daily at bedtime 12/05/22 1530 01/02/23 2159   11/18/22 1200  ceFAZolin (ANCEF) IVPB 3g/100 mL premix        3 g 200 mL/hr over 30 Minutes Intravenous Every Fri (Hemodialysis) 11/13/22 1039 12/02/22 1224   11/17/22 0000  ceFAZolin (ANCEF) IVPB 1 g/50 mL premix       Note to Pharmacy: Send with pt to OR   1 g 100 mL/hr over 30 Minutes Intravenous On call 11/16/22 1154 11/16/22 1500   11/16/22 1200  ceFAZolin (ANCEF) IVPB 2g/100 mL premix        2 g 200 mL/hr over 30 Minutes Intravenous Every Wed (Hemodialysis) 11/13/22 1039 11/30/22 1723   11/14/22 1200  ceFAZolin (ANCEF) IVPB 2g/100 mL premix        2 g 200 mL/hr over 30 Minutes Intravenous Every Mon (Hemodialysis) 11/13/22 1039 11/28/22 1151   11/11/22 2200  ceFAZolin (ANCEF) IVPB 1 g/50 mL premix         1 g 100 mL/hr over 30 Minutes Intravenous Daily at 10  pm 11/10/22 1655 11/13/22 2320   11/09/22 1200  ceFAZolin (ANCEF) IVPB 2g/100 mL premix  Status:  Discontinued        2 g 200 mL/hr over 30 Minutes Intravenous Every Wed (Hemodialysis) 11/02/22 1246 11/03/22 1111   11/07/22 1700  ceFAZolin (ANCEF) IVPB 2g/100 mL premix        2 g 200 mL/hr over 30 Minutes Intravenous  Once 11/07/22 1610 11/07/22 1827   11/07/22 1200  ceFAZolin (ANCEF) IVPB 2g/100 mL premix  Status:  Discontinued        2 g 200 mL/hr over 30 Minutes Intravenous Every Mon (Hemodialysis) 11/02/22 1246 11/10/22 1655   11/04/22 1200  ceFAZolin (ANCEF) IVPB 3g/100 mL premix  Status:  Discontinued        3 g 200 mL/hr over 30 Minutes Intravenous Every Fri (Hemodialysis) 11/02/22 1246 11/10/22 1655   11/02/22 2200  ceFAZolin (ANCEF) IVPB 1 g/50 mL premix  Status:  Discontinued        1 g 100 mL/hr over 30 Minutes Intravenous Daily at bedtime 11/01/22 1452 11/02/22 1246   11/02/22 1330  ceFAZolin (ANCEF) IVPB 2g/100 mL premix  Status:  Discontinued        2 g 200 mL/hr over 30 Minutes Intravenous Every Wed (Hemodialysis) 11/02/22 1320 11/10/22 1655   11/02/22 1200  ceFAZolin (ANCEF) IVPB 2g/100 mL premix  Status:  Discontinued        2 g 200 mL/hr over 30 Minutes Intravenous Every Wed (Hemodialysis) 10/28/22 1004 11/01/22 1452   10/31/22 1200  ceFAZolin (ANCEF) IVPB 2g/100 mL premix  Status:  Discontinued        2 g 200 mL/hr over 30 Minutes Intravenous Every Mon (Hemodialysis) 10/28/22 1004 11/01/22 1452   10/28/22 1200  ceFAZolin (ANCEF) IVPB 3g/100 mL premix  Status:  Discontinued        3 g 200 mL/hr over 30 Minutes Intravenous Every Fri (Hemodialysis) 10/28/22 1004 11/01/22 1452   10/19/22 2200  ceFAZolin (ANCEF) IVPB 1 g/50 mL premix  Status:  Discontinued        1 g 100 mL/hr over 30 Minutes Intravenous Every 24 hours 10/18/22 0834 10/28/22 1004   10/18/22 1000  ceFAZolin (ANCEF) IVPB 2g/100 mL premix        2  g 200 mL/hr over 30 Minutes Intravenous  Once 10/18/22 0834 10/18/22 0946   10/17/22 1400  ceFEPIme (MAXIPIME) 1 g in sodium chloride 0.9 % 100 mL IVPB  Status:  Discontinued        1 g 200 mL/hr over 30 Minutes Intravenous Every 24 hours 10/17/22 1252 10/17/22 1520   10/14/22 1200  vancomycin (VANCOCIN) IVPB 1000 mg/200 mL premix  Status:  Discontinued        1,000 mg 200 mL/hr over 60 Minutes Intravenous Every M-W-F (Hemodialysis) 10/13/22 1356 10/19/22 0809   10/13/22 1400  ceFEPIme (MAXIPIME) 1 g in sodium chloride 0.9 % 100 mL IVPB  Status:  Discontinued        1 g 200 mL/hr over 30 Minutes Intravenous Every 24 hours 10/13/22 1140 10/17/22 1223   10/13/22 1230  vancomycin (VANCOREADY) IVPB 2000 mg/400 mL        2,000 mg 200 mL/hr over 120 Minutes Intravenous  Once 10/13/22 1140 10/13/22 1437   10/12/22 1645  vancomycin (VANCOREADY) IVPB 1500 mg/300 mL  Status:  Discontinued       See Hyperspace for full Linked Orders Report.   1,500 mg 150 mL/hr over 120 Minutes Intravenous  Once 10/12/22 1110 10/12/22 1753   10/12/22 1600  ceFEPIme (MAXIPIME) 1 g in sodium chloride 0.9 % 100 mL IVPB  Status:  Discontinued        1 g 200 mL/hr over 30 Minutes Intravenous Every 24 hours 10/12/22 1112 10/12/22 1753   10/12/22 1500  vancomycin (VANCOCIN) IVPB 1000 mg/200 mL premix  Status:  Discontinued       See Hyperspace for full Linked Orders Report.   1,000 mg 200 mL/hr over 60 Minutes Intravenous  Once 10/12/22 1110 10/12/22 1753   10/12/22 1115  ceFEPIme (MAXIPIME) 1 g in sodium chloride 0.9 % 100 mL IVPB  Status:  Discontinued        1 g 200 mL/hr over 30 Minutes Intravenous  Once 10/12/22 1110 10/12/22 1112           Data Reviewed:  I have personally reviewed the following...  CBC: Recent Labs  Lab 12/02/22 0843 12/05/22 1037 12/07/22 0416 12/08/22 0513  WBC 6.0 5.5 5.6 8.0  NEUTROABS  --   --  2.2 4.1  HGB 8.4* 8.6* 8.6* 8.9*  HCT 28.7* 28.7* 28.2* 29.3*  MCV 84.2 83.7  83.4 83.7  PLT 219 221 213 207   Basic Metabolic Panel: Recent Labs  Lab 12/02/22 0551 12/03/22 0550 12/04/22 0618 12/05/22 1037 12/07/22 0416  NA 133* 133* 134* 136 134*  K 3.8 3.2* 4.0 3.9 3.7  CL 96* 95* 98 98 96*  CO2 29 28 28 27 28   GLUCOSE 111* 116* 111* 105* 92  BUN 38* 20 31* 43* 28*  CREATININE 5.58* 3.62* 5.23* 6.85* 5.95*  CALCIUM 9.7 9.2 10.1 10.0 9.5  PHOS  --   --   --  5.8*  --    GFR: Estimated Creatinine Clearance: 10.9 mL/min (A) (by C-G formula based on SCr of 5.95 mg/dL (H)). Liver Function Tests: Recent Labs  Lab 12/05/22 1037  ALBUMIN 2.2*   No results for input(s): "LIPASE", "AMYLASE" in the last 168 hours. No results for input(s): "AMMONIA" in the last 168 hours. Coagulation Profile: No results for input(s): "INR", "PROTIME" in the last 168 hours. Cardiac Enzymes: No results for input(s): "CKTOTAL", "CKMB", "CKMBINDEX", "TROPONINI" in the last 168 hours. BNP (last 3 results) No results for input(s): "PROBNP" in the last 8760 hours. HbA1C: No results for input(s): "HGBA1C" in the last 72 hours. CBG: No results for input(s): "GLUCAP" in the last 168 hours. Lipid Profile: No results for input(s): "CHOL", "HDL", "LDLCALC", "TRIG", "CHOLHDL", "LDLDIRECT" in the last 72 hours. Thyroid Function Tests: No results for input(s): "TSH", "T4TOTAL", "FREET4", "T3FREE", "THYROIDAB" in the last 72 hours. Anemia Panel: No results for input(s): "VITAMINB12", "FOLATE", "FERRITIN", "TIBC", "IRON", "RETICCTPCT" in the last 72 hours. Most Recent Urinalysis On File:     Component Value Date/Time   COLORURINE YELLOW (A) 03/20/2022 0125   APPEARANCEUR CLEAR (A) 03/20/2022 0125   APPEARANCEUR Clear 06/14/2014 1510   LABSPEC 1.010 03/20/2022 0125   LABSPEC 1.006 06/14/2014 1510   PHURINE 7.0 03/20/2022 0125   GLUCOSEU 50 (A) 03/20/2022 0125   GLUCOSEU >=500 06/14/2014 1510   HGBUR MODERATE (A) 03/20/2022 0125   BILIRUBINUR NEGATIVE 03/20/2022 0125    BILIRUBINUR Negative 06/14/2014 1510   KETONESUR NEGATIVE 03/20/2022 0125   PROTEINUR 100 (A) 03/20/2022 0125   NITRITE NEGATIVE 03/20/2022 0125   LEUKOCYTESUR NEGATIVE 03/20/2022 0125   LEUKOCYTESUR Negative 06/14/2014 1510   Sepsis Labs: @LABRCNTIP (procalcitonin:4,lacticidven:4) Microbiology: No results found for this or any previous visit (from the past  240 hour(s)).    Radiology Studies last 3 days: No results found.           LOS: 57 days      Sunnie Nielsen, DO [facility] 12/08/2022, 11:48 AM    Dictation software may have been used to generate the above note. Typos may occur and escape review in typed/dictated notes. Please contact Dr Lyn Hollingshead directly for clarity if needed.  Staff may message me via secure chat in Epic  but this may not receive an immediate response,  please page me for urgent matters!  If 7PM-7AM, please contact night coverage [email or web address]

## 2022-12-08 NOTE — Progress Notes (Signed)
[facility]  PROGRESS NOTE   Subjective:   Hemodialysis treatment yesterday. Tolerated treatment.  Patient confused this morning.   Objective:  Vital signs: Blood pressure (!) 99/49, pulse 80, temperature (!) 97.5 F (36.4 C), resp. rate 15, height 6' (1.829 m), weight 89 kg, SpO2 94 %.  Intake/Output Summary (Last 24 hours) at 12/08/2022 1214 Last data filed at 12/07/2022 1931 Gross per 24 hour  Intake 360 ml  Output --  Net 360 ml    Filed Weights   12/05/22 1413 12/07/22 0830 12/07/22 1200  Weight: 88.9 kg 90.1 kg 89 kg     Physical Exam: General:  No acute distress, laying in bed  Head:  Normocephalic, atraumatic. Moist oral mucosal membranes  Eyes:  Anicteric  Neck:  Supple  Lungs:   Clear to auscultation, normal effort  Heart:  regular  Abdomen:   Soft, nontender, bowel sounds present  Extremities:  peripheral edema.  Neurologic:  Awake, alert, following commands  Skin:  No lesions  Access:  LIJ permcath    Basic Metabolic Panel: Recent Labs  Lab 12/02/22 0551 12/03/22 0550 12/04/22 0618 12/05/22 1037 12/07/22 0416  NA 133* 133* 134* 136 134*  K 3.8 3.2* 4.0 3.9 3.7  CL 96* 95* 98 98 96*  CO2 29 28 28 27 28   GLUCOSE 111* 116* 111* 105* 92  BUN 38* 20 31* 43* 28*  CREATININE 5.58* 3.62* 5.23* 6.85* 5.95*  CALCIUM 9.7 9.2 10.1 10.0 9.5  PHOS  --   --   --  5.8*  --     GFR: Estimated Creatinine Clearance: 10.9 mL/min (A) (by C-G formula based on SCr of 5.95 mg/dL (H)).  Liver Function Tests: Recent Labs  Lab 12/05/22 1037  ALBUMIN 2.2*    No results for input(s): "LIPASE", "AMYLASE" in the last 168 hours. No results for input(s): "AMMONIA" in the last 168 hours.  CBC: Recent Labs  Lab 12/02/22 0843 12/05/22 1037 12/07/22 0416 12/08/22 0513  WBC 6.0 5.5 5.6 8.0  NEUTROABS  --   --  2.2 4.1  HGB 8.4* 8.6* 8.6* 8.9*  HCT 28.7* 28.7* 28.2* 29.3*  MCV 84.2 83.7 83.4 83.7  PLT 219 221 213 207      HbA1C: Hemoglobin  A1C  Date/Time Value Ref Range Status  06/15/2014 12:58 AM 7.9 (H) 4.2 - 6.3 % Final    Comment:    The American Diabetes Association recommends that a primary goal of therapy should be <7% and that physicians should reevaluate the treatment regimen in patients with HbA1c values consistently >8%.   12/24/2013 11:18 AM 8.2 (H) 4.2 - 6.3 % Final    Comment:    The American Diabetes Association recommends that a primary goal of therapy should be <7% and that physicians should reevaluate the treatment regimen in patients with HbA1c values consistently >8%.    Hgb A1c MFr Bld  Date/Time Value Ref Range Status  10/13/2022 11:46 AM 5.2 4.8 - 5.6 % Final    Comment:    (NOTE)         Prediabetes: 5.7 - 6.4         Diabetes: >6.4         Glycemic control for adults with diabetes: <7.0   03/23/2022 06:47 AM 5.8 (H) 4.8 - 5.6 % Final    Comment:    (NOTE) Pre diabetes:          5.7%-6.4%  Diabetes:              >  6.4%  Glycemic control for   <7.0% adults with diabetes     Urinalysis: No results for input(s): "COLORURINE", "LABSPEC", "PHURINE", "GLUCOSEU", "HGBUR", "BILIRUBINUR", "KETONESUR", "PROTEINUR", "UROBILINOGEN", "NITRITE", "LEUKOCYTESUR" in the last 72 hours.  Invalid input(s): "APPERANCEUR"    Imaging: No results found.   Medications:    sodium chloride Stopped (11/16/22 1155)   anticoagulant sodium citrate      (feeding supplement) PROSource Plus  30 mL Oral BID BM   cephALEXin  500 mg Oral QHS   Chlorhexidine Gluconate Cloth  6 each Topical Q0600   Chlorhexidine Gluconate Cloth  6 each Topical Q0600   cinacalcet  30 mg Oral Q supper   epoetin (EPOGEN/PROCRIT) injection  10,000 Units Intravenous Q M,W,F-HD   feeding supplement (NEPRO CARB STEADY)  237 mL Oral TID BM   fentaNYL  1 patch Transdermal Q72H   gabapentin  100 mg Oral Once per day on Mon Wed Fri   Gerhardt's butt cream   Topical QID   heparin injection (subcutaneous)  5,000 Units Subcutaneous Q8H    levETIRAcetam  1,000 mg Oral Daily   levETIRAcetam  250 mg Oral Q M,W,F   pantoprazole  20 mg Oral BID   psyllium  1 packet Oral Daily   QUEtiapine  25 mg Oral QHS   sertraline  25 mg Oral Daily   sodium chloride flush  10-40 mL Intracatheter Q12H   umeclidinium-vilanterol  1 puff Inhalation Daily    Assessment/ Plan:     Ms. Shinique Fohl is a 72 y.o. female with end stage renal disease on hemodialysis, CAD, COPD with intermittent home O2, hypertension, hyperlipidemia, stroke, diabetes who has been admitted since 3/20. Initially admitted for vertebral osteomyelitis. She has completed IV antibiotics. Now is working on placement.   [facility] LIJ permcath   #1 ESRD on hemodialysis.    Continue MWF schedule. Dialysis for tomorrow.  - looking for home modalities for outpatient placement.  - patient needs to be able to be seated in a chair for her dialysis treatments. Patient made aware.    #2: Anemia with chronic kidney disease - epo dose to 10000 units q treatment.    #3: Second hyperparathyroidism: phos and calcium acceptable.  - Pending PTH    #4: Hypertension: discontinued metoprolol.    LOS: 57 Lamont Dowdy, MD [facility] 5/16/202412:14 PM

## 2022-12-09 LAB — CBC
HCT: 29.6 % — ABNORMAL LOW (ref 36.0–46.0)
Hemoglobin: 8.8 g/dL — ABNORMAL LOW (ref 12.0–15.0)
MCH: 25.1 pg — ABNORMAL LOW (ref 26.0–34.0)
MCHC: 29.7 g/dL — ABNORMAL LOW (ref 30.0–36.0)
MCV: 84.6 fL (ref 80.0–100.0)
Platelets: 230 10*3/uL (ref 150–400)
RBC: 3.5 MIL/uL — ABNORMAL LOW (ref 3.87–5.11)
RDW: 19 % — ABNORMAL HIGH (ref 11.5–15.5)
WBC: 7.6 10*3/uL (ref 4.0–10.5)
nRBC: 0 % (ref 0.0–0.2)

## 2022-12-09 LAB — CBC WITH DIFFERENTIAL/PLATELET
Abs Immature Granulocytes: 0.06 10*3/uL (ref 0.00–0.07)
Basophils Absolute: 0.1 10*3/uL (ref 0.0–0.1)
Basophils Relative: 1 %
Eosinophils Absolute: 0.1 10*3/uL (ref 0.0–0.5)
Eosinophils Relative: 1 %
HCT: 28.8 % — ABNORMAL LOW (ref 36.0–46.0)
Hemoglobin: 8.7 g/dL — ABNORMAL LOW (ref 12.0–15.0)
Immature Granulocytes: 1 %
Lymphocytes Relative: 25 %
Lymphs Abs: 1.9 10*3/uL (ref 0.7–4.0)
MCH: 25.2 pg — ABNORMAL LOW (ref 26.0–34.0)
MCHC: 30.2 g/dL (ref 30.0–36.0)
MCV: 83.5 fL (ref 80.0–100.0)
Monocytes Absolute: 0.8 10*3/uL (ref 0.1–1.0)
Monocytes Relative: 10 %
Neutro Abs: 4.9 10*3/uL (ref 1.7–7.7)
Neutrophils Relative %: 62 %
Platelets: 224 10*3/uL (ref 150–400)
RBC: 3.45 MIL/uL — ABNORMAL LOW (ref 3.87–5.11)
RDW: 18.9 % — ABNORMAL HIGH (ref 11.5–15.5)
WBC: 7.8 10*3/uL (ref 4.0–10.5)
nRBC: 0 % (ref 0.0–0.2)

## 2022-12-09 LAB — RENAL FUNCTION PANEL
Albumin: 2.3 g/dL — ABNORMAL LOW (ref 3.5–5.0)
Anion gap: 11 (ref 5–15)
BUN: 29 mg/dL — ABNORMAL HIGH (ref 8–23)
CO2: 27 mmol/L (ref 22–32)
Calcium: 10.1 mg/dL (ref 8.9–10.3)
Chloride: 97 mmol/L — ABNORMAL LOW (ref 98–111)
Creatinine, Ser: 6.53 mg/dL — ABNORMAL HIGH (ref 0.44–1.00)
GFR, Estimated: 6 mL/min — ABNORMAL LOW (ref >60)
Glucose, Bld: 112 mg/dL — ABNORMAL HIGH (ref 70–99)
Phosphorus: 6.3 mg/dL — ABNORMAL HIGH (ref 2.5–4.6)
Potassium: 3.5 mmol/L (ref 3.5–5.1)
Sodium: 135 mmol/L (ref 135–145)

## 2022-12-09 LAB — HEPATITIS B SURFACE ANTIGEN: Hepatitis B Surface Ag: NONREACTIVE

## 2022-12-09 LAB — PARATHYROID HORMONE, INTACT (NO CA): PTH: 157 pg/mL — ABNORMAL HIGH (ref 15–65)

## 2022-12-09 MED ORDER — HEPARIN SODIUM (PORCINE) 1000 UNIT/ML IJ SOLN
INTRAMUSCULAR | Status: AC
  Filled 2022-12-09: qty 10

## 2022-12-09 MED ORDER — EPOETIN ALFA 10000 UNIT/ML IJ SOLN
INTRAMUSCULAR | Status: AC
  Filled 2022-12-09: qty 1

## 2022-12-09 NOTE — Discharge Planning (Addendum)
Currently looking into HHD as an option, due to barrier of not being able to tolerate sitting in a recliner for HD. Spoke with Fallon, Home FA, she stated that there were somethings that needed consideration: 1. Pt will need a bed with a scale 2. Pt will have to come once a week for MD visits 3. One maybe two persons have to commit to giving tx 4 times a week 4. The earliest training will be in July   Attempted to call Selena Batten [facility]) to discuss the possibility of Home Hemodialysis and what that would look like. No answer, left VM for a call back.

## 2022-12-09 NOTE — Progress Notes (Signed)
Hemodialysis note  Received patient in bed to unit. Alert and oriented to person.  Informed consent signed and in chart.  Treatment initiated: 0811 Treatment completed: 1125  Patient came off 20 mins early. Transported back to room, alert without acute distress.  Report given to patient's RN.   Access used: Left Chest HD catheter Access issues: High Arterial pressures. Lines were reversed.  Total UF removed: 1.7L Medication(s) given:  Epogen 10 000 units  Post HD weight: 86.3 kg   Wolfgang Phoenix [facility]

## 2022-12-09 NOTE — Progress Notes (Signed)
1120: Patient was crying and screaming in the Dialysis unit. She stated " I want to come off, take me off now". This RN explored the reasons why she wants to come off and she said " I just want to come off". She stil has 20 mins remaining on her treatment time. Dr. Wynelle Link was notified as well as the floor RN.

## 2022-12-09 NOTE — Progress Notes (Signed)
[facility]  PROGRESS NOTE   Subjective:   Seen and examined on hemodialysis treatment. Tolerating treatment well. Pleasantly confused.     HEMODIALYSIS FLOWSHEET:  Blood Flow Rate (mL/min): 350 mL/min Arterial Pressure (mmHg): -210 mmHg Venous Pressure (mmHg): 220 mmHg TMP (mmHg): 11 mmHg Ultrafiltration Rate (mL/min): 862 mL/min Dialysate Flow Rate (mL/min): 300 ml/min Dialysis Fluid Bolus: Normal Saline Bolus Amount (mL): 500 mL   Objective:  Vital signs: Blood pressure (!) 149/76, pulse 86, temperature 98 F (36.7 C), temperature source Oral, resp. rate 11, height 6' (1.829 m), weight 87.7 kg, SpO2 100 %.  Intake/Output Summary (Last 24 hours) at 12/09/2022 1013 Last data filed at 12/08/2022 1424 Gross per 24 hour  Intake 0 ml  Output --  Net 0 ml    Filed Weights   12/07/22 0830 12/07/22 1200 12/09/22 0800  Weight: 90.1 kg 89 kg 87.7 kg     Physical Exam: General:  No acute distress, laying in bed  Head:  Normocephalic, atraumatic. Moist oral mucosal membranes  Eyes:  Anicteric  Neck:  Supple  Lungs:   Clear to auscultation, normal effort  Heart:  regular  Abdomen:   Soft, nontender, bowel sounds present  Extremities:  peripheral edema.  Neurologic:  Awake, alert, following commands  Skin:  No lesions  Access:  LIJ permcath    Basic Metabolic Panel: Recent Labs  Lab 12/03/22 0550 12/04/22 0618 12/05/22 1037 12/07/22 0416 12/09/22 0804  NA 133* 134* 136 134* 135  K 3.2* 4.0 3.9 3.7 3.5  CL 95* 98 98 96* 97*  CO2 28 28 27 28 27   GLUCOSE 116* 111* 105* 92 112*  BUN 20 31* 43* 28* 29*  CREATININE 3.62* 5.23* 6.85* 5.95* 6.53*  CALCIUM 9.2 10.1 10.0 9.5 10.1  PHOS  --   --  5.8*  --  6.3*    GFR: Estimated Creatinine Clearance: 9.1 mL/min (A) (by C-G formula based on SCr of 6.53 mg/dL (H)).  Liver Function Tests: Recent Labs  Lab 12/05/22 1037 12/09/22 0804  ALBUMIN 2.2* 2.3*    No results for input(s): "LIPASE", "AMYLASE"  in the last 168 hours. No results for input(s): "AMMONIA" in the last 168 hours.  CBC: Recent Labs  Lab 12/05/22 1037 12/07/22 0416 12/08/22 0513 12/09/22 0457 12/09/22 0804  WBC 5.5 5.6 8.0 7.8 7.6  NEUTROABS  --  2.2 4.1 4.9  --   HGB 8.6* 8.6* 8.9* 8.7* 8.8*  HCT 28.7* 28.2* 29.3* 28.8* 29.6*  MCV 83.7 83.4 83.7 83.5 84.6  PLT 221 213 207 224 230      HbA1C: Hemoglobin A1C  Date/Time Value Ref Range Status  06/15/2014 12:58 AM 7.9 (H) 4.2 - 6.3 % Final    Comment:    The American Diabetes Association recommends that a primary goal of therapy should be <7% and that physicians should reevaluate the treatment regimen in patients with HbA1c values consistently >8%.   12/24/2013 11:18 AM 8.2 (H) 4.2 - 6.3 % Final    Comment:    The American Diabetes Association recommends that a primary goal of therapy should be <7% and that physicians should reevaluate the treatment regimen in patients with HbA1c values consistently >8%.    Hgb A1c MFr Bld  Date/Time Value Ref Range Status  10/13/2022 11:46 AM 5.2 4.8 - 5.6 % Final    Comment:    (NOTE)         Prediabetes: 5.7 - 6.4  Diabetes: >6.4         Glycemic control for adults with diabetes: <7.0   03/23/2022 06:47 AM 5.8 (H) 4.8 - 5.6 % Final    Comment:    (NOTE) Pre diabetes:          5.7%-6.4%  Diabetes:              >6.4%  Glycemic control for   <7.0% adults with diabetes     Urinalysis: No results for input(s): "COLORURINE", "LABSPEC", "PHURINE", "GLUCOSEU", "HGBUR", "BILIRUBINUR", "KETONESUR", "PROTEINUR", "UROBILINOGEN", "NITRITE", "LEUKOCYTESUR" in the last 72 hours.  Invalid input(s): "APPERANCEUR"    Imaging: No results found.   Medications:    sodium chloride Stopped (11/16/22 1155)   anticoagulant sodium citrate      (feeding supplement) PROSource Plus  30 mL Oral BID BM   cephALEXin  500 mg Oral QHS   Chlorhexidine Gluconate Cloth  6 each Topical Q0600   Chlorhexidine Gluconate  Cloth  6 each Topical Q0600   cinacalcet  30 mg Oral Q supper   epoetin (EPOGEN/PROCRIT) injection  10,000 Units Intravenous Q M,W,F-HD   feeding supplement (NEPRO CARB STEADY)  237 mL Oral TID BM   fentaNYL  1 patch Transdermal Q72H   gabapentin  100 mg Oral Once per day on Mon Wed Fri   Gerhardt's butt cream   Topical QID   heparin injection (subcutaneous)  5,000 Units Subcutaneous Q8H   levETIRAcetam  1,000 mg Oral Daily   levETIRAcetam  250 mg Oral Q M,W,F   pantoprazole  20 mg Oral BID   psyllium  1 packet Oral Daily   QUEtiapine  25 mg Oral QHS   sertraline  25 mg Oral Daily   sodium chloride flush  10-40 mL Intracatheter Q12H   umeclidinium-vilanterol  1 puff Inhalation Daily    Assessment/ Plan:     [name] is a 72 y.o. female with end stage renal disease on hemodialysis, CAD, COPD with intermittent home O2, hypertension, hyperlipidemia, stroke, diabetes who has been admitted since 3/20. Initially admitted for vertebral osteomyelitis. She has completed IV antibiotics. Now is working on placement.   [facility] TTS [facility] LIJ permcath   #1 ESRD: see and examined on hemodialysis.    Continue MWF schedule.  - looking for home modalities for outpatient placement.  - patient needs to be able to be seated in a chair for her dialysis treatments    #2: Anemia with chronic kidney disease: hemoglobin 8.8 - epo dose to 10000 units q treatment.    #3: Second hyperparathyroidism: phos and calcium acceptable. - Continue cinacalcet  - Pending PTH    #4: Hypertension: 146/76. Not currently on any blood pressure agents.    LOS: 58 Lamont Dowdy, MD [facility] 5/17/202410:13 AM

## 2022-12-09 NOTE — Progress Notes (Signed)
PROGRESS NOTE    Cynthia Dean   UJW:[id number] DOB: 1951-02-11  DOA: 10/12/2022 Date of Service: 12/09/22 PCP: Cynthia Chance, MD     Brief Narrative / Hospital Course:  Cynthia Dean is a 72 y.o. 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 She presents to the ED with worsening lower extremity pain. Admitted for possible Discitis/osteomyelitis at L4-L5: w/ suspected intradiscal abscess as per MRI. 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   4/18, HD access / fistula clotted off during dialysis. 4/19 pt taken for procedure to address fistula malfunction, but was agitated and combative, resfused procedure. 4/19 vascular placed R femoral temp HD cath placed, resumed HD and pemcath placed 4/24  Patient has had waxing and waning mental status with intermittently refusing dialysis. Patient's family have decided to continue hemodialysis after speaking with their PCP. Hospital course prolonged, complicated by patient's inability to tolerate sitting in recliner chair for dialysis.  She only agrees to dialysis in the bed.  This is a barrier to discharge as no outpatient dialysis centers can accommodate.      5/3: titrating up on fentanyl patch 12.5 --> 25 mcg, seems to be helping, if still unable to sit up in chair may consider increasing dose further   5/14- back pain seems to be well-controlled.  Caseworkers have discussed the case, will likely involve ethics committee/Hospital administration. Looking into home dialysis.     Consultants:  Neurosurgery Infectious Disease Vascular surgery  Nephrology   Procedures: 09/22/22: Stent placement to the AV graft  10/13/22: CT aspiration of right psoas muscle abscess yielding 2 ml purulent material  11/11/22: R Femoral V catheter  placement 11/16/22: Permcath placement to L IJ       ASSESSMENT & PLAN:   Principal Problem:   Back pain Active Problems:   Anemia   Bilateral leg pain   ESRD on dialysis [facility])   Essential hypertension   Dyslipidemia   Seizure disorder (HCC)   Diabetic retinopathy without macular edema associated with type 2 diabetes mellitus (HCC)   GERD (gastroesophageal reflux disease)   Diabetic neuropathy (HCC)   Anemia of chronic disease   Insulin-requiring or dependent type II diabetes mellitus (HCC)   Discitis of lumbar region   Myositis   Psoas abscess (HCC)   Lumbar discitis, 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   Wound cx growing staph capitis.   ID consulted.  Abx de-escalated to cefazolin.   Not a good surgical candidate as per neuro surg.   Pt having severe low back and bilateral leg pain and has not been able to tolerate any position except laying nearly flat in the bed cont cefazolin, for 6 weeks, End Date: 12/02/22 pain management per orders -- palliative care following peripherally 5/3: titrate up on fentanyl patch 12.5 --> 25 mcg - pt reports pain is improved, but still unable to tolerate sitting in chair for dialysis    Confusion and agitation - improved Hospital delirium  Pt was agitated, and also not responding to questions or commands during early hospitalization.  Mental status did start to improve, and now pt is calm and interactive, however, at this time she has been having waxing and waning mental status  Delirium precautions cont seroquel 50 mg nightly to help with delirium and sleep.   ESRD: on HD MWF.  Pt refused HD on 10/17/22, 10/18/22 and 3/27, 4/1.   When attempts made to do HD in chair, she did not tolerate on account of severe back pain Recommend consideration for home hemodialysis if no facility  capable to do HD in the bed Pt has voiced on occasion that she does not want dialysis and has been intermittently refusing this. Family encouraged her to complete HD.  Ethics consult placed to discuss appropriate decision-making but in my judgment patient lacks capacity to give informed consent/refusal and the appropriate surrogate decision maker is involved (HCPOA is daughter) so while the situation is frustrating we do not have strong evidence that the patient, while lucid, was refusing dialysis consistently.    Generalized weakness / Physical Debility:  PT recs SNF. TOC following. Pt is bedridden and very high risk for developing pressure injuries of skin -- reposition frequently.   Monitor pressures areas on skin and offload as possible.   Severe back and bilateral leg pain due to severe multi-level lumbar degenerative disc disease. Pt has bulging discs at L2-3, L3-4, L4-5 and L5-S1. Pt will continue to have back and leg pain once antibiotics are complete. Pain mgmt per orders  Pain control complicated by AMS and hypotension with escalation - titrated up on fentanyl patch slowly and 25 mcg seems to be helpful, cautious use of IV fentanyl for breakthrough pain    ACD:  likely secondary ESRD. H&H are stable  Monitor periodic CBC   Hypokalemia Replace as needed Monitor BMP   Peripheral neuropathy --  Continue low dose gabapentin and monitor   Hx of seizures:  continue on home dose of Keppra   HLD:  continue on statin    Hx of COPD: w/o exacerbation.  Continue on bronchodilators    DM2: well controlled HbA1c 5.2.  d/c'ed BG checks    HTN:  Antihypertensives as blood pressure allows   GERD:  continue on PPI    Depression: severity unknown.  Continue on home dose of sertraline     Overweight: BMI 28.2.  Wt loss efforts recommended   Right foot pain  see peripheral neuropathy   DVT prophylaxis: heparin sq Pertinent IV fluids/nutrition: no continuous IV fluids   Central lines / invasive devices: L IJ HD cath, port R chest   Code Status: DNR ACP documentation reviewed: 05/01 and 05/16- MOST and advanced directive reviewed, Cynthia Dean is HCPOA  Current Admission Status: inpatient  TOC needs / Dispo plan: SNF Barriers to discharge / significant pending items: unable to sit to toelrate outpatient HD, working w/ PT, TOC involved             Subjective / Brief ROS:  No concerns, pt resting comfortably    Family Communication: none at this time     Objective Findings:  Vitals:   12/09/22 1100 12/09/22 1125 12/09/22 1130 12/09/22 1223  BP: 113/79 (!) 154/105  122/60  Pulse: 86 80  84  Resp: 12 15  18   Temp:  97.8 F (36.6 C)  98.8 F (37.1 C)  TempSrc:  Oral    SpO2: 98% 98%  91%  Weight:   86.3 kg   Height:        Intake/Output Summary (Last 24 hours) at 12/09/2022 1351 Last data filed at 12/09/2022 1125 Gross per 24 hour  Intake 0 ml  Output 1700 ml  Net -1700 ml  Filed Weights   12/07/22 1200 12/09/22 0800 12/09/22 1130  Weight: 89 kg 87.7 kg 86.3 kg    Examination:  Physical Exam Constitutional:      General: She is not in acute distress.    Appearance: Normal appearance. She is obese.  Cardiovascular:     Rate and Rhythm: Normal rate and regular rhythm.  Pulmonary:     Effort: Pulmonary effort is normal.     Breath sounds: Normal breath sounds.  Neurological:     Mental Status: She is alert. Mental status is at baseline.  Psychiatric:        Mood and Affect: Mood normal.        Behavior: Behavior normal.          Scheduled Medications:   (feeding supplement) PROSource Plus  30 mL Oral BID BM   cephALEXin  500 mg Oral QHS   Chlorhexidine Gluconate Cloth  6 each Topical Q0600   Chlorhexidine Gluconate Cloth  6 each Topical Q0600   cinacalcet  30 mg Oral Q supper   epoetin (EPOGEN/PROCRIT) injection  10,000 Units Intravenous Q M,W,F-HD   feeding supplement (NEPRO CARB STEADY)  237 mL  Oral TID BM   fentaNYL  1 patch Transdermal Q72H   gabapentin  100 mg Oral Once per day on Mon Wed Fri   Gerhardt's butt cream   Topical QID   heparin injection (subcutaneous)  5,000 Units Subcutaneous Q8H   levETIRAcetam  1,000 mg Oral Daily   levETIRAcetam  250 mg Oral Q M,W,F   pantoprazole  20 mg Oral BID   psyllium  1 packet Oral Daily   QUEtiapine  25 mg Oral QHS   sertraline  25 mg Oral Daily   sodium chloride flush  10-40 mL Intracatheter Q12H   umeclidinium-vilanterol  1 puff Inhalation Daily    Continuous Infusions:  sodium chloride Stopped (11/16/22 1155)   anticoagulant sodium citrate      PRN Medications:  alteplase, anticoagulant sodium citrate, fentaNYL (SUBLIMAZE) injection, guaiFENesin, hydrALAZINE, lidocaine (PF), lidocaine-prilocaine, LORazepam, ondansetron, phenol, senna-docusate, sodium chloride flush  Antimicrobials from admission:  Anti-infectives (From admission, onward)    Start     Dose/Rate Route Frequency Ordered Stop   12/05/22 2200  cephALEXin (KEFLEX) capsule 500 mg        500 mg Oral Daily at bedtime 12/05/22 1530 01/02/23 2159   11/18/22 1200  ceFAZolin (ANCEF) IVPB 3g/100 mL premix        3 g 200 mL/hr over 30 Minutes Intravenous Every Fri (Hemodialysis) 11/13/22 1039 12/02/22 1224   11/17/22 0000  ceFAZolin (ANCEF) IVPB 1 g/50 mL premix       Note to Pharmacy: Send with pt to OR   1 g 100 mL/hr over 30 Minutes Intravenous On call 11/16/22 1154 11/16/22 1500   11/16/22 1200  ceFAZolin (ANCEF) IVPB 2g/100 mL premix        2 g 200 mL/hr over 30 Minutes Intravenous Every Wed (Hemodialysis) 11/13/22 1039 11/30/22 1723   11/14/22 1200  ceFAZolin (ANCEF) IVPB 2g/100 mL premix        2 g 200 mL/hr over 30 Minutes Intravenous Every Mon (Hemodialysis) 11/13/22 1039 11/28/22 1151   11/11/22 2200  ceFAZolin (ANCEF) IVPB 1 g/50 mL premix        1 g 100 mL/hr over 30 Minutes Intravenous Daily at 10 pm 11/10/22 1655 11/13/22 2320   11/09/22 1200   ceFAZolin (ANCEF) IVPB 2g/100 mL premix  Status:  Discontinued  2 g 200 mL/hr over 30 Minutes Intravenous Every Wed (Hemodialysis) 11/02/22 1246 11/03/22 1111   11/07/22 1700  ceFAZolin (ANCEF) IVPB 2g/100 mL premix        2 g 200 mL/hr over 30 Minutes Intravenous  Once 11/07/22 1610 11/07/22 1827   11/07/22 1200  ceFAZolin (ANCEF) IVPB 2g/100 mL premix  Status:  Discontinued        2 g 200 mL/hr over 30 Minutes Intravenous Every Mon (Hemodialysis) 11/02/22 1246 11/10/22 1655   11/04/22 1200  ceFAZolin (ANCEF) IVPB 3g/100 mL premix  Status:  Discontinued        3 g 200 mL/hr over 30 Minutes Intravenous Every Fri (Hemodialysis) 11/02/22 1246 11/10/22 1655   11/02/22 2200  ceFAZolin (ANCEF) IVPB 1 g/50 mL premix  Status:  Discontinued        1 g 100 mL/hr over 30 Minutes Intravenous Daily at bedtime 11/01/22 1452 11/02/22 1246   11/02/22 1330  ceFAZolin (ANCEF) IVPB 2g/100 mL premix  Status:  Discontinued        2 g 200 mL/hr over 30 Minutes Intravenous Every Wed (Hemodialysis) 11/02/22 1320 11/10/22 1655   11/02/22 1200  ceFAZolin (ANCEF) IVPB 2g/100 mL premix  Status:  Discontinued        2 g 200 mL/hr over 30 Minutes Intravenous Every Wed (Hemodialysis) 10/28/22 1004 11/01/22 1452   10/31/22 1200  ceFAZolin (ANCEF) IVPB 2g/100 mL premix  Status:  Discontinued        2 g 200 mL/hr over 30 Minutes Intravenous Every Mon (Hemodialysis) 10/28/22 1004 11/01/22 1452   10/28/22 1200  ceFAZolin (ANCEF) IVPB 3g/100 mL premix  Status:  Discontinued        3 g 200 mL/hr over 30 Minutes Intravenous Every Fri (Hemodialysis) 10/28/22 1004 11/01/22 1452   10/19/22 2200  ceFAZolin (ANCEF) IVPB 1 g/50 mL premix  Status:  Discontinued        1 g 100 mL/hr over 30 Minutes Intravenous Every 24 hours 10/18/22 0834 10/28/22 1004   10/18/22 1000  ceFAZolin (ANCEF) IVPB 2g/100 mL premix        2 g 200 mL/hr over 30 Minutes Intravenous  Once 10/18/22 0834 10/18/22 0946   10/17/22 1400  ceFEPIme  (MAXIPIME) 1 g in sodium chloride 0.9 % 100 mL IVPB  Status:  Discontinued        1 g 200 mL/hr over 30 Minutes Intravenous Every 24 hours 10/17/22 1252 10/17/22 1520   10/14/22 1200  vancomycin (VANCOCIN) IVPB 1000 mg/200 mL premix  Status:  Discontinued        1,000 mg 200 mL/hr over 60 Minutes Intravenous Every M-W-F (Hemodialysis) 10/13/22 1356 10/19/22 0809   10/13/22 1400  ceFEPIme (MAXIPIME) 1 g in sodium chloride 0.9 % 100 mL IVPB  Status:  Discontinued        1 g 200 mL/hr over 30 Minutes Intravenous Every 24 hours 10/13/22 1140 10/17/22 1223   10/13/22 1230  vancomycin (VANCOREADY) IVPB 2000 mg/400 mL        2,000 mg 200 mL/hr over 120 Minutes Intravenous  Once 10/13/22 1140 10/13/22 1437   10/12/22 1645  vancomycin (VANCOREADY) IVPB 1500 mg/300 mL  Status:  Discontinued       See Hyperspace for full Linked Orders Report.   1,500 mg 150 mL/hr over 120 Minutes Intravenous  Once 10/12/22 1110 10/12/22 1753   10/12/22 1600  ceFEPIme (MAXIPIME) 1 g in sodium chloride 0.9 % 100 mL IVPB  Status:  Discontinued  1 g 200 mL/hr over 30 Minutes Intravenous Every 24 hours 10/12/22 1112 10/12/22 1753   10/12/22 1500  vancomycin (VANCOCIN) IVPB 1000 mg/200 mL premix  Status:  Discontinued       See Hyperspace for full Linked Orders Report.   1,000 mg 200 mL/hr over 60 Minutes Intravenous  Once 10/12/22 1110 10/12/22 1753   10/12/22 1115  ceFEPIme (MAXIPIME) 1 g in sodium chloride 0.9 % 100 mL IVPB  Status:  Discontinued        1 g 200 mL/hr over 30 Minutes Intravenous  Once 10/12/22 1110 10/12/22 1112           Data Reviewed:  I have personally reviewed the following...  CBC: Recent Labs  Lab 12/05/22 1037 12/07/22 0416 12/08/22 0513 12/09/22 0457 12/09/22 0804  WBC 5.5 5.6 8.0 7.8 7.6  NEUTROABS  --  2.2 4.1 4.9  --   HGB 8.6* 8.6* 8.9* 8.7* 8.8*  HCT 28.7* 28.2* 29.3* 28.8* 29.6*  MCV 83.7 83.4 83.7 83.5 84.6  PLT 221 213 207 224 230    Basic Metabolic  Panel: Recent Labs  Lab 12/03/22 0550 12/04/22 0618 12/05/22 1037 12/07/22 0416 12/09/22 0804  NA 133* 134* 136 134* 135  K 3.2* 4.0 3.9 3.7 3.5  CL 95* 98 98 96* 97*  CO2 28 28 27 28 27   GLUCOSE 116* 111* 105* 92 112*  BUN 20 31* 43* 28* 29*  CREATININE 3.62* 5.23* 6.85* 5.95* 6.53*  CALCIUM 9.2 10.1 10.0 9.5 10.1  PHOS  --   --  5.8*  --  6.3*    GFR: Estimated Creatinine Clearance: 9.1 mL/min (A) (by C-G formula based on SCr of 6.53 mg/dL (H)). Liver Function Tests: Recent Labs  Lab 12/05/22 1037 12/09/22 0804  ALBUMIN 2.2* 2.3*    No results for input(s): "LIPASE", "AMYLASE" in the last 168 hours. No results for input(s): "AMMONIA" in the last 168 hours. Coagulation Profile: No results for input(s): "INR", "PROTIME" in the last 168 hours. Cardiac Enzymes: No results for input(s): "CKTOTAL", "CKMB", "CKMBINDEX", "TROPONINI" in the last 168 hours. BNP (last 3 results) No results for input(s): "PROBNP" in the last 8760 hours. HbA1C: No results for input(s): "HGBA1C" in the last 72 hours. CBG: No results for input(s): "GLUCAP" in the last 168 hours. Lipid Profile: No results for input(s): "CHOL", "HDL", "LDLCALC", "TRIG", "CHOLHDL", "LDLDIRECT" in the last 72 hours. Thyroid Function Tests: No results for input(s): "TSH", "T4TOTAL", "FREET4", "T3FREE", "THYROIDAB" in the last 72 hours. Anemia Panel: No results for input(s): "VITAMINB12", "FOLATE", "FERRITIN", "TIBC", "IRON", "RETICCTPCT" in the last 72 hours. Most Recent Urinalysis On File:     Component Value Date/Time   COLORURINE YELLOW (A) 03/20/2022 0125   APPEARANCEUR CLEAR (A) 03/20/2022 0125   APPEARANCEUR Clear 06/14/2014 1510   LABSPEC 1.010 03/20/2022 0125   LABSPEC 1.006 06/14/2014 1510   PHURINE 7.0 03/20/2022 0125   GLUCOSEU 50 (A) 03/20/2022 0125   GLUCOSEU >=500 06/14/2014 1510   HGBUR MODERATE (A) 03/20/2022 0125   BILIRUBINUR NEGATIVE 03/20/2022 0125   BILIRUBINUR Negative 06/14/2014 1510    KETONESUR NEGATIVE 03/20/2022 0125   PROTEINUR 100 (A) 03/20/2022 0125   NITRITE NEGATIVE 03/20/2022 0125   LEUKOCYTESUR NEGATIVE 03/20/2022 0125   LEUKOCYTESUR Negative 06/14/2014 1510   Sepsis Labs: @LABRCNTIP (procalcitonin:4,lacticidven:4) Microbiology: No results found for this or any previous visit (from the past 240 hour(s)).    Radiology Studies last 3 days: No results found.  LOS: 58 days      Sunnie Nielsen, DO [facility] 12/09/2022, 1:51 PM    Dictation software may have been used to generate the above note. Typos may occur and escape review in typed/dictated notes. Please contact Dr Lyn Hollingshead directly for clarity if needed.  Staff may message me via secure chat in Epic  but this may not receive an immediate response,  please page me for urgent matters!  If 7PM-7AM, please contact night coverage [email or web address]

## 2022-12-09 NOTE — Progress Notes (Addendum)
While sitting at computer, I heard someone yelling on the treatment floor. Going to inquire. I found patient crying and stating she did not want to be put on the machine. Erie Noe, RN and I were there comforting patient, while Erie Noe, RN began taking patient off. While there comforting patient, patient began stating she "did not no longer want this," "I'm just tried, I don't wanna no more dialysis," and "I wanna to see momma." Patient immediately calmed down once she saw that she was not being placed on dialysis.

## 2022-12-10 MED ORDER — SEVELAMER CARBONATE 2.4 G PO PACK
2.4000 g | PACK | Freq: Three times a day (TID) | ORAL | Status: DC
  Administered 2022-12-10 – 2022-12-27 (×43): 2.4 g via ORAL
  Filled 2022-12-10 (×53): qty 1

## 2022-12-10 NOTE — Progress Notes (Signed)
[facility]  PROGRESS NOTE   Subjective:   Did not complete hemodialysis treatment yesterday. Patient did not give a reason for wanting to discontinue her treatment early.   No distress this morning. Pleasant, alert to self.   Objective:  Vital signs: Blood pressure (!) 138/55, pulse (!) 58, temperature 97.7 F (36.5 C), temperature source Oral, resp. rate 12, height 6' (1.829 m), weight 86.3 kg, SpO2 92 %.  Intake/Output Summary (Last 24 hours) at 12/10/2022 1033 Last data filed at 12/09/2022 1900 Gross per 24 hour  Intake 240 ml  Output 1700 ml  Net -1460 ml    Filed Weights   12/07/22 1200 12/09/22 0800 12/09/22 1130  Weight: 89 kg 87.7 kg 86.3 kg     Physical Exam: General:  No acute distress, laying in bed  Head:  Normocephalic, atraumatic. Moist oral mucosal membranes  Eyes:  Anicteric  Neck:  Supple  Lungs:   Clear to auscultation, normal effort  Heart:  regular  Abdomen:   Soft, nontender, bowel sounds present  Extremities:  peripheral edema.  Neurologic:  Alert to self.   Skin:  No lesions  Access:  [facility] permcath    Basic Metabolic Panel: Recent Labs  Lab 12/04/22 0618 12/05/22 1037 12/07/22 0416 12/09/22 0804  NA 134* 136 134* 135  K 4.0 3.9 3.7 3.5  CL 98 98 96* 97*  CO2 28 27 28 27   GLUCOSE 111* 105* 92 112*  BUN 31* 43* 28* 29*  CREATININE 5.23* 6.85* 5.95* 6.53*  CALCIUM 10.1 10.0 9.5 10.1  PHOS  --  5.8*  --  6.3*    GFR: Estimated Creatinine Clearance: 9.1 mL/min (A) (by C-G formula based on SCr of 6.53 mg/dL (H)).  Liver Function Tests: Recent Labs  Lab 12/05/22 1037 12/09/22 0804  ALBUMIN 2.2* 2.3*    No results for input(s): "LIPASE", "AMYLASE" in the last 168 hours. No results for input(s): "AMMONIA" in the last 168 hours.  CBC: Recent Labs  Lab 12/05/22 1037 12/07/22 0416 12/08/22 0513 12/09/22 0457 12/09/22 0804  WBC 5.5 5.6 8.0 7.8 7.6  NEUTROABS  --  2.2 4.1 4.9  --   HGB 8.6* 8.6* 8.9* 8.7* 8.8*   HCT 28.7* 28.2* 29.3* 28.8* 29.6*  MCV 83.7 83.4 83.7 83.5 84.6  PLT 221 213 207 224 230      HbA1C: Hemoglobin A1C  Date/Time Value Ref Range Status  06/15/2014 12:58 AM 7.9 (H) 4.2 - 6.3 % Final    Comment:    The American Diabetes Association recommends that a primary goal of therapy should be <7% and that physicians should reevaluate the treatment regimen in patients with HbA1c values consistently >8%.   12/24/2013 11:18 AM 8.2 (H) 4.2 - 6.3 % Final    Comment:    The American Diabetes Association recommends that a primary goal of therapy should be <7% and that physicians should reevaluate the treatment regimen in patients with HbA1c values consistently >8%.    Hgb A1c MFr Bld  Date/Time Value Ref Range Status  10/13/2022 11:46 AM 5.2 4.8 - 5.6 % Final    Comment:    (NOTE)         Prediabetes: 5.7 - 6.4         Diabetes: >6.4         Glycemic control for adults with diabetes: <7.0   03/23/2022 06:47 AM 5.8 (H) 4.8 - 5.6 % Final    Comment:    (NOTE) Pre diabetes:  5.7%-6.4%  Diabetes:              >6.4%  Glycemic control for   <7.0% adults with diabetes     Urinalysis: No results for input(s): "COLORURINE", "LABSPEC", "PHURINE", "GLUCOSEU", "HGBUR", "BILIRUBINUR", "KETONESUR", "PROTEINUR", "UROBILINOGEN", "NITRITE", "LEUKOCYTESUR" in the last 72 hours.  Invalid input(s): "APPERANCEUR"    Imaging: No results found.   Medications:    sodium chloride Stopped (11/16/22 1155)   anticoagulant sodium citrate      (feeding supplement) PROSource Plus  30 mL Oral BID BM   cephALEXin  500 mg Oral QHS   Chlorhexidine Gluconate Cloth  6 each Topical Q0600   Chlorhexidine Gluconate Cloth  6 each Topical Q0600   cinacalcet  30 mg Oral Q supper   epoetin (EPOGEN/PROCRIT) injection  10,000 Units Intravenous Q M,W,F-HD   feeding supplement (NEPRO CARB STEADY)  237 mL Oral TID BM   fentaNYL  1 patch Transdermal Q72H   gabapentin  100 mg Oral Once per day  on Mon Wed Fri   Gerhardt's butt cream   Topical QID   heparin injection (subcutaneous)  5,000 Units Subcutaneous Q8H   levETIRAcetam  1,000 mg Oral Daily   levETIRAcetam  250 mg Oral Q M,W,F   pantoprazole  20 mg Oral BID   psyllium  1 packet Oral Daily   QUEtiapine  25 mg Oral QHS   sertraline  25 mg Oral Daily   sodium chloride flush  10-40 mL Intracatheter Q12H   umeclidinium-vilanterol  1 puff Inhalation Daily    Assessment/ Plan:     [name] is a 72 y.o. female with end stage renal disease on hemodialysis, CAD, COPD with intermittent home O2, hypertension, hyperlipidemia, stroke, diabetes who has been admitted since 3/20. Initially admitted for vertebral osteomyelitis. She has completed IV antibiotics. Now is working on placement.   [facility] TTS [facility] permcath   #1 ESRD: no acute indication for dialysis today.  - next hemodailysis treatment for Monday.  - Continue MWF schedule.    #2: Anemia with chronic kidney disease: hemoglobin 8.8 - epo dose to 10000 units q treatment.    #3: Second hyperparathyroidism: with hyperphosphatemia and calcium upper level on goal. PTH 157 - lower end of goal.  - Continue cinacalcet  - Start sevelamer with meals.    #4: Hypertension: 138/55. Not currently on any blood pressure agents.    LOS: 59 Lamont Dowdy, MD [facility] 5/18/202410:33 AM

## 2022-12-10 NOTE — Progress Notes (Signed)
PROGRESS NOTE    Cynthia Dean   ZOX:[id number] DOB: 1951/03/22  DOA: 10/12/2022 Date of Service: 12/10/22 PCP: Loistine Chance, MD     Brief Narrative / Hospital Course:  Cynthia Dean is a 72 y.o. 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 She presents to the ED with worsening lower extremity pain. Admitted for possible Discitis/osteomyelitis at L4-L5: w/ suspected intradiscal abscess as per MRI. 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   4/18, HD access / fistula clotted off during dialysis. 4/19 pt taken for procedure to address fistula malfunction, but was agitated and combative, resfused procedure. 4/19 vascular placed R femoral temp HD cath placed, resumed HD and pemcath placed 4/24  Patient has had waxing and waning mental status with intermittently refusing dialysis. Patient's family have decided to continue hemodialysis after speaking with their PCP. Hospital course prolonged, complicated by patient's inability to tolerate sitting in recliner chair for dialysis.  She only agrees to dialysis in the bed.  This is a barrier to discharge as no outpatient dialysis centers can accommodate.      5/3: titrating up on fentanyl patch 12.5 --> 25 mcg, seems to be helping, if still unable to sit up in chair may consider increasing dose further   5/14- back pain seems to be well-controlled.  Caseworkers have discussed the case, will involve ethics committee/Hospital administration. Looking into home dialysis.     Consultants:  Neurosurgery Infectious Disease Vascular surgery  Nephrology   Procedures: 09/22/22: Stent placement to the AV graft  10/13/22: CT aspiration of right psoas muscle abscess yielding 2 ml purulent material  11/11/22: R Femoral V catheter placement 11/16/22:  Permcath placement to L IJ       ASSESSMENT & PLAN:   Principal Problem:   Back pain Active Problems:   Anemia   Bilateral leg pain   ESRD on dialysis [facility])   Essential hypertension   Dyslipidemia   Seizure disorder (HCC)   Diabetic retinopathy without macular edema associated with type 2 diabetes mellitus (HCC)   GERD (gastroesophageal reflux disease)   Diabetic neuropathy (HCC)   Anemia of chronic disease   Insulin-requiring or dependent type II diabetes mellitus (HCC)   Discitis of lumbar region   Myositis   Psoas abscess (HCC)   Lumbar discitis, 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   Wound cx growing staph capitis.   ID consulted.  Abx de-escalated to cefazolin.   Not a good surgical candidate as per neuro surg.   Pt having severe low back and bilateral leg pain and has not been able to tolerate any position except laying nearly flat in the bed cont cefazolin, for 6 weeks, End Date: 12/02/22 pain management per orders -- palliative care following peripherally 5/3: titrate up on fentanyl patch 12.5 --> 25 mcg - pt reports pain is improved, but still unable to tolerate sitting in chair for dialysis    Confusion and agitation - improved Hospital delirium  Pt was agitated, and also not responding to questions or commands during early hospitalization.  Mental status did start to improve, and now pt is calm and interactive, however, at this time she has been having waxing and waning mental status Delirium  precautions cont seroquel 50 mg nightly to help with delirium and sleep.   ESRD: on HD MWF.  Pt refused HD on 10/17/22, 10/18/22 and 3/27, 4/1.   When attempts made to do HD in chair, she did not tolerate on account of severe back pain Recommend consideration for home hemodialysis if no facility capable to do HD in  the bed Pt has voiced on occasion that she does not want dialysis and has been intermittently refusing this. Family encouraged her to complete HD.  Ethics consult placed to discuss appropriate decision-making but in my judgment patient lacks capacity to give informed consent/refusal and the appropriate surrogate decision maker is involved (HCPOA is daughter) so while the situation is frustrating we do not have strong evidence that the patient, while lucid, was refusing dialysis consistently.    Generalized weakness / Physical Debility:  PT recs SNF. TOC following. Pt is bedridden and very high risk for developing pressure injuries of skin -- reposition frequently.   Monitor pressures areas on skin and offload as possible.   Severe back and bilateral leg pain due to severe multi-level lumbar degenerative disc disease. Pt has bulging discs at L2-3, L3-4, L4-5 and L5-S1. Pt will continue to have back and leg pain once antibiotics are complete. Pain mgmt per orders  Pain control complicated by AMS and hypotension with escalation - titrated up on fentanyl patch slowly and 25 mcg seems to be helpful, cautious use of IV fentanyl for breakthrough pain    ACD:  likely secondary ESRD. H&H are stable  Monitor periodic CBC   Hypokalemia Replace as needed Monitor BMP   Peripheral neuropathy --  Continue low dose gabapentin and monitor   Hx of seizures:  continue on home dose of Keppra   HLD:  continue on statin    Hx of COPD: w/o exacerbation.  Continue on bronchodilators    DM2: well controlled HbA1c 5.2.  d/c'ed BG checks    HTN:  Antihypertensives as blood pressure allows   GERD:  continue on PPI    Depression: severity unknown.  Continue on home dose of sertraline     Overweight: BMI 28.2.  Wt loss efforts recommended   Right foot pain  see peripheral neuropathy   DVT prophylaxis: heparin sq Pertinent IV fluids/nutrition: no continuous IV fluids  Central lines /  invasive devices: L IJ HD cath, port R chest   Code Status: DNR ACP documentation reviewed: 05/01 and 05/16- MOST and advanced directive reviewed, [name] is HCPOA  Current Admission Status: inpatient  TOC needs / Dispo plan: SNF Barriers to discharge / significant pending items: unable to sit to toelrate outpatient HD, working w/ PT, TOC involved             Subjective / Brief ROS:  No concerns, pt resting comfortably    Family Communication: none at this time     Objective Findings:  Vitals:   12/09/22 1223 12/09/22 1618 12/09/22 2010 12/10/22 0415  BP: 122/60 (!) 125/48 (!) 103/50 (!) 138/55  Pulse: 84  96 (!) 58  Resp: 18 18 16 12   Temp: 98.8 F (37.1 C) 97.9 F (36.6 C) 98.5 F (36.9 C) 97.7 F (36.5 C)  TempSrc:   Oral Oral  SpO2: 91% 92% 94% 92%  Weight:      Height:        Intake/Output Summary (Last 24 hours) at 12/10/2022 1457 Last data filed at 12/10/2022 0900 Gross per 24 hour  Intake 240 ml  Output --  Net 240 ml    Filed Weights   12/07/22 1200 12/09/22 0800 12/09/22 1130  Weight: 89 kg 87.7 kg 86.3 kg    Examination:  Physical Exam Constitutional:      General: She is not in acute distress.    Appearance: Normal appearance. She is obese.  Pulmonary:     Effort: Pulmonary effort is normal.          Scheduled Medications:   (feeding supplement) PROSource Plus  30 mL Oral BID BM   cephALEXin  500 mg Oral QHS   Chlorhexidine Gluconate Cloth  6 each Topical Q0600   Chlorhexidine Gluconate Cloth  6 each Topical Q0600   cinacalcet  30 mg Oral Q supper   epoetin (EPOGEN/PROCRIT) injection  10,000 Units Intravenous Q M,W,F-HD   feeding supplement (NEPRO CARB STEADY)  237 mL Oral TID BM   fentaNYL  1 patch Transdermal Q72H   gabapentin  100 mg Oral Once per day on Mon Wed Fri   Gerhardt's butt cream   Topical QID   heparin injection (subcutaneous)  5,000 Units Subcutaneous Q8H   levETIRAcetam  1,000 mg Oral Daily    levETIRAcetam  250 mg Oral Q M,W,F   pantoprazole  20 mg Oral BID   psyllium  1 packet Oral Daily   QUEtiapine  25 mg Oral QHS   sertraline  25 mg Oral Daily   sevelamer carbonate  2.4 g Oral TID WC   sodium chloride flush  10-40 mL Intracatheter Q12H   umeclidinium-vilanterol  1 puff Inhalation Daily    Continuous Infusions:  sodium chloride Stopped (11/16/22 1155)   anticoagulant sodium citrate      PRN Medications:  alteplase, anticoagulant sodium citrate, fentaNYL (SUBLIMAZE) injection, guaiFENesin, hydrALAZINE, lidocaine (PF), lidocaine-prilocaine, LORazepam, ondansetron, phenol, senna-docusate, sodium chloride flush  Antimicrobials from admission:  Anti-infectives (From admission, onward)    Start     Dose/Rate Route Frequency Ordered Stop   12/05/22 2200  cephALEXin (KEFLEX) capsule 500 mg        500 mg Oral Daily at bedtime 12/05/22 1530 01/02/23 2159   11/18/22 1200  ceFAZolin (ANCEF) IVPB 3g/100 mL premix        3 g 200 mL/hr over 30 Minutes Intravenous Every Fri (Hemodialysis) 11/13/22 1039 12/02/22 1224   11/17/22 0000  ceFAZolin (ANCEF) IVPB 1 g/50 mL premix       Note to Pharmacy: Send with pt to OR   1 g 100 mL/hr over 30 Minutes Intravenous On call 11/16/22 1154 11/16/22 1500   11/16/22 1200  ceFAZolin (ANCEF) IVPB 2g/100 mL premix        2 g 200 mL/hr over 30 Minutes Intravenous Every Wed (Hemodialysis) 11/13/22 1039 11/30/22 1723   11/14/22 1200  ceFAZolin (ANCEF) IVPB 2g/100 mL premix        2 g 200 mL/hr over 30 Minutes Intravenous Every Mon (Hemodialysis) 11/13/22 1039 11/28/22 1151   11/11/22 2200  ceFAZolin (ANCEF) IVPB 1 g/50 mL premix        1 g 100 mL/hr over 30 Minutes Intravenous Daily at 10 pm 11/10/22 1655 11/13/22 2320   11/09/22 1200  ceFAZolin (ANCEF) IVPB 2g/100 mL premix  Status:  Discontinued        2 g 200 mL/hr over 30 Minutes Intravenous Every Wed (Hemodialysis) 11/02/22 1246 11/03/22 1111   11/07/22 1700  ceFAZolin (ANCEF) IVPB 2g/100  mL premix        2 g 200 mL/hr over 30  Minutes Intravenous  Once 11/07/22 1610 11/07/22 1827   11/07/22 1200  ceFAZolin (ANCEF) IVPB 2g/100 mL premix  Status:  Discontinued        2 g 200 mL/hr over 30 Minutes Intravenous Every Mon (Hemodialysis) 11/02/22 1246 11/10/22 1655   11/04/22 1200  ceFAZolin (ANCEF) IVPB 3g/100 mL premix  Status:  Discontinued        3 g 200 mL/hr over 30 Minutes Intravenous Every Fri (Hemodialysis) 11/02/22 1246 11/10/22 1655   11/02/22 2200  ceFAZolin (ANCEF) IVPB 1 g/50 mL premix  Status:  Discontinued        1 g 100 mL/hr over 30 Minutes Intravenous Daily at bedtime 11/01/22 1452 11/02/22 1246   11/02/22 1330  ceFAZolin (ANCEF) IVPB 2g/100 mL premix  Status:  Discontinued        2 g 200 mL/hr over 30 Minutes Intravenous Every Wed (Hemodialysis) 11/02/22 1320 11/10/22 1655   11/02/22 1200  ceFAZolin (ANCEF) IVPB 2g/100 mL premix  Status:  Discontinued        2 g 200 mL/hr over 30 Minutes Intravenous Every Wed (Hemodialysis) 10/28/22 1004 11/01/22 1452   10/31/22 1200  ceFAZolin (ANCEF) IVPB 2g/100 mL premix  Status:  Discontinued        2 g 200 mL/hr over 30 Minutes Intravenous Every Mon (Hemodialysis) 10/28/22 1004 11/01/22 1452   10/28/22 1200  ceFAZolin (ANCEF) IVPB 3g/100 mL premix  Status:  Discontinued        3 g 200 mL/hr over 30 Minutes Intravenous Every Fri (Hemodialysis) 10/28/22 1004 11/01/22 1452   10/19/22 2200  ceFAZolin (ANCEF) IVPB 1 g/50 mL premix  Status:  Discontinued        1 g 100 mL/hr over 30 Minutes Intravenous Every 24 hours 10/18/22 0834 10/28/22 1004   10/18/22 1000  ceFAZolin (ANCEF) IVPB 2g/100 mL premix        2 g 200 mL/hr over 30 Minutes Intravenous  Once 10/18/22 0834 10/18/22 0946   10/17/22 1400  ceFEPIme (MAXIPIME) 1 g in sodium chloride 0.9 % 100 mL IVPB  Status:  Discontinued        1 g 200 mL/hr over 30 Minutes Intravenous Every 24 hours 10/17/22 1252 10/17/22 1520   10/14/22 1200  vancomycin (VANCOCIN) IVPB 1000  mg/200 mL premix  Status:  Discontinued        1,000 mg 200 mL/hr over 60 Minutes Intravenous Every M-W-F (Hemodialysis) 10/13/22 1356 10/19/22 0809   10/13/22 1400  ceFEPIme (MAXIPIME) 1 g in sodium chloride 0.9 % 100 mL IVPB  Status:  Discontinued        1 g 200 mL/hr over 30 Minutes Intravenous Every 24 hours 10/13/22 1140 10/17/22 1223   10/13/22 1230  vancomycin (VANCOREADY) IVPB 2000 mg/400 mL        2,000 mg 200 mL/hr over 120 Minutes Intravenous  Once 10/13/22 1140 10/13/22 1437   10/12/22 1645  vancomycin (VANCOREADY) IVPB 1500 mg/300 mL  Status:  Discontinued       See Hyperspace for full Linked Orders Report.   1,500 mg 150 mL/hr over 120 Minutes Intravenous  Once 10/12/22 1110 10/12/22 1753   10/12/22 1600  ceFEPIme (MAXIPIME) 1 g in sodium chloride 0.9 % 100 mL IVPB  Status:  Discontinued        1 g 200 mL/hr over 30 Minutes Intravenous Every 24 hours 10/12/22 1112 10/12/22 1753   10/12/22 1500  vancomycin (VANCOCIN) IVPB 1000 mg/200 mL premix  Status:  Discontinued  See Hyperspace for full Linked Orders Report.   1,000 mg 200 mL/hr over 60 Minutes Intravenous  Once 10/12/22 1110 10/12/22 1753   10/12/22 1115  ceFEPIme (MAXIPIME) 1 g in sodium chloride 0.9 % 100 mL IVPB  Status:  Discontinued        1 g 200 mL/hr over 30 Minutes Intravenous  Once 10/12/22 1110 10/12/22 1112           Data Reviewed:  I have personally reviewed the following...  CBC: Recent Labs  Lab 12/05/22 1037 12/07/22 0416 12/08/22 0513 12/09/22 0457 12/09/22 0804  WBC 5.5 5.6 8.0 7.8 7.6  NEUTROABS  --  2.2 4.1 4.9  --   HGB 8.6* 8.6* 8.9* 8.7* 8.8*  HCT 28.7* 28.2* 29.3* 28.8* 29.6*  MCV 83.7 83.4 83.7 83.5 84.6  PLT 221 213 207 224 230    Basic Metabolic Panel: Recent Labs  Lab 12/04/22 0618 12/05/22 1037 12/07/22 0416 12/09/22 0804  NA 134* 136 134* 135  K 4.0 3.9 3.7 3.5  CL 98 98 96* 97*  CO2 28 27 28 27   GLUCOSE 111* 105* 92 112*  BUN 31* 43* 28* 29*   CREATININE 5.23* 6.85* 5.95* 6.53*  CALCIUM 10.1 10.0 9.5 10.1  PHOS  --  5.8*  --  6.3*    GFR: Estimated Creatinine Clearance: 9.1 mL/min (A) (by C-G formula based on SCr of 6.53 mg/dL (H)). Liver Function Tests: Recent Labs  Lab 12/05/22 1037 12/09/22 0804  ALBUMIN 2.2* 2.3*    No results for input(s): "LIPASE", "AMYLASE" in the last 168 hours. No results for input(s): "AMMONIA" in the last 168 hours. Coagulation Profile: No results for input(s): "INR", "PROTIME" in the last 168 hours. Cardiac Enzymes: No results for input(s): "CKTOTAL", "CKMB", "CKMBINDEX", "TROPONINI" in the last 168 hours. BNP (last 3 results) No results for input(s): "PROBNP" in the last 8760 hours. HbA1C: No results for input(s): "HGBA1C" in the last 72 hours. CBG: No results for input(s): "GLUCAP" in the last 168 hours. Lipid Profile: No results for input(s): "CHOL", "HDL", "LDLCALC", "TRIG", "CHOLHDL", "LDLDIRECT" in the last 72 hours. Thyroid Function Tests: No results for input(s): "TSH", "T4TOTAL", "FREET4", "T3FREE", "THYROIDAB" in the last 72 hours. Anemia Panel: No results for input(s): "VITAMINB12", "FOLATE", "FERRITIN", "TIBC", "IRON", "RETICCTPCT" in the last 72 hours. Most Recent Urinalysis On File:     Component Value Date/Time   COLORURINE YELLOW (A) 03/20/2022 0125   APPEARANCEUR CLEAR (A) 03/20/2022 0125   APPEARANCEUR Clear 06/14/2014 1510   LABSPEC 1.010 03/20/2022 0125   LABSPEC 1.006 06/14/2014 1510   PHURINE 7.0 03/20/2022 0125   GLUCOSEU 50 (A) 03/20/2022 0125   GLUCOSEU >=500 06/14/2014 1510   HGBUR MODERATE (A) 03/20/2022 0125   BILIRUBINUR NEGATIVE 03/20/2022 0125   BILIRUBINUR Negative 06/14/2014 1510   KETONESUR NEGATIVE 03/20/2022 0125   PROTEINUR 100 (A) 03/20/2022 0125   NITRITE NEGATIVE 03/20/2022 0125   LEUKOCYTESUR NEGATIVE 03/20/2022 0125   LEUKOCYTESUR Negative 06/14/2014 1510   Sepsis Labs: @LABRCNTIP (procalcitonin:4,lacticidven:4) Microbiology: No  results found for this or any previous visit (from the past 240 hour(s)).    Radiology Studies last 3 days: No results found.           LOS: 59 days      Sunnie Nielsen, DO [facility] 12/10/2022, 2:57 PM    Dictation software may have been used to generate the above note. Typos may occur and escape review in typed/dictated notes. Please contact Dr Lyn Hollingshead directly for clarity if needed.  Staff may message me via secure chat in Epic  but this may not receive an immediate response,  please page me for urgent matters!  If 7PM-7AM, please contact night coverage [email or web address]

## 2022-12-11 DIAGNOSIS — D649 Anemia, unspecified: Secondary | ICD-10-CM | POA: Diagnosis not present

## 2022-12-11 DIAGNOSIS — M79604 Pain in right leg: Secondary | ICD-10-CM | POA: Diagnosis not present

## 2022-12-11 DIAGNOSIS — M5442 Lumbago with sciatica, left side: Secondary | ICD-10-CM | POA: Diagnosis not present

## 2022-12-11 DIAGNOSIS — E1142 Type 2 diabetes mellitus with diabetic polyneuropathy: Secondary | ICD-10-CM | POA: Diagnosis not present

## 2022-12-11 NOTE — Progress Notes (Signed)
PROGRESS NOTE    Cynthia Dean   FAO:[id number] DOB: Oct 23, 1950  DOA: 10/12/2022 Date of Service: 12/11/22 PCP: Loistine Chance, MD     Brief Narrative / Hospital Course:  Cynthia Dean is a 72 y.o. 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 She presents to the ED with worsening lower extremity pain. Admitted for possible Discitis/osteomyelitis at L4-L5: w/ suspected intradiscal abscess as per MRI. 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   4/18, HD access / fistula clotted off during dialysis. 4/19 pt taken for procedure to address fistula malfunction, but was agitated and combative, resfused procedure. 4/19 vascular placed R femoral temp HD cath placed, resumed HD and pemcath placed 4/24  Patient has had waxing and waning mental status with intermittently refusing dialysis. Patient's family have decided to continue hemodialysis after speaking with their PCP. Hospital course prolonged, complicated by patient's inability to tolerate sitting in recliner chair for dialysis.  She only agrees to dialysis in the bed.  This is a barrier to discharge as no outpatient dialysis centers can accommodate.      5/3: titrating up on fentanyl patch 12.5 --> 25 mcg, seems to be helping, if still unable to sit up in chair may consider increasing dose further   5/14- back pain seems to be well-controlled.  Caseworkers have discussed the case, will involve ethics committee/Hospital administration. Looking into home dialysis.   5/19 - d/w patient, she states she doesn't want dialysis but cannot give a reason/explanation. I She voices she wants to live and that she needs dialysis to live. Lacks capacity to make informed decision. Continue defer to HCPOA.    Consultants:  Neurosurgery Infectious  Disease Vascular surgery  Nephrology   Procedures: 09/22/22: Stent placement to the AV graft  10/13/22: CT aspiration of right psoas muscle abscess yielding 2 ml purulent material  11/11/22: R Femoral V catheter placement 11/16/22: Permcath placement to L IJ       ASSESSMENT & PLAN:   Principal Problem:   Back pain Active Problems:   Anemia   Bilateral leg pain   ESRD on dialysis [facility])   Essential hypertension   Dyslipidemia   Seizure disorder (HCC)   Diabetic retinopathy without macular edema associated with type 2 diabetes mellitus (HCC)   GERD (gastroesophageal reflux disease)   Diabetic neuropathy (HCC)   Anemia of chronic disease   Insulin-requiring or dependent type II diabetes mellitus (HCC)   Discitis of lumbar region   Myositis   Psoas abscess (HCC)   Lumbar discitis, 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   Wound cx growing staph capitis.   ID consulted.  Abx de-escalated to cefazolin.   Not a good surgical candidate as per neuro surg.   Pt having severe low back and bilateral leg pain and has not been able to tolerate any position except laying nearly flat in the bed cont cefazolin, for 6 weeks, End Date: 12/02/22 pain management per orders -- palliative care following peripherally 5/3: titrate up on fentanyl patch 12.5 --> 25 mcg - pt reports pain is improved, but still unable to tolerate sitting in chair for dialysis    Confusion and agitation - improved Hospital delirium  Pt was  agitated, and also not responding to questions or commands during early hospitalization.  Mental status did start to improve, and now pt is calm and interactive, however, at this time she has been having waxing and waning mental status Delirium precautions cont seroquel 50 mg nightly to help with delirium and sleep.   ESRD:  on HD MWF.  Pt refused HD on 10/17/22, 10/18/22 and 3/27, 4/1.   When attempts made to do HD in chair, she did not tolerate on account of severe back pain Recommend consideration for home hemodialysis if no facility capable to do HD in the bed Pt has voiced on occasion that she does not want dialysis and has been intermittently refusing this. Family encouraged her to complete HD.  Ethics consult placed to discuss appropriate decision-making but in my judgment patient lacks capacity to give informed consent/refusal and the appropriate surrogate decision maker is involved (HCPOA is daughter) so while the situation is frustrating we do not have strong evidence that the patient, while lucid, was refusing dialysis consistently.    Generalized weakness / Physical Debility:  PT recs SNF. TOC following. Pt is bedridden and very high risk for developing pressure injuries of skin -- reposition frequently.   Monitor pressures areas on skin and offload as possible.   Severe back and bilateral leg pain due to severe multi-level lumbar degenerative disc disease. Pt has bulging discs at L2-3, L3-4, L4-5 and L5-S1. Pt will continue to have back and leg pain once antibiotics are complete. Pain mgmt per orders  Pain control complicated by AMS and hypotension with escalation - titrated up on fentanyl patch slowly and 25 mcg seems to be helpful, cautious use of IV fentanyl for breakthrough pain    ACD:  likely secondary ESRD. H&H are stable  Monitor periodic CBC   Hypokalemia Replace as needed Monitor BMP   Peripheral neuropathy --  Continue low dose gabapentin and monitor   Hx of seizures:  continue on home dose of Keppra   HLD:  continue on statin    Hx of COPD: w/o exacerbation.  Continue on bronchodilators    DM2: well controlled HbA1c 5.2.  d/c'ed BG checks    HTN:  Antihypertensives as blood pressure allows   GERD:  continue on PPI    Depression: severity unknown.  Continue on home  dose of sertraline     Overweight: BMI 28.2.  Wt loss efforts recommended   Right foot pain  see peripheral neuropathy   DVT prophylaxis: heparin sq Pertinent IV fluids/nutrition: no continuous IV fluids  Central lines / invasive devices: L IJ HD cath, port R chest   Code Status: DNR ACP documentation reviewed: 05/01 and 05/16- MOST and advanced directive reviewed, [name] is HCPOA  Current Admission Status: inpatient  TOC needs / Dispo plan: SNF Barriers to discharge / significant pending items: unable to sit to toelrate outpatient HD, working w/ PT, TOC involved             Subjective / Brief ROS:  No concerns, pt resting comfortably  We discussed dialysis - she states she doesn't really want dialysis but cannot give a reason/explanation. She voices she wants to live and that she needs dialysis to live.    Family Communication: none at this time - called Roddie Mc 12/11/22 12:04 PM and left HIPAA compliant vm     Objective Findings:  Vitals:   12/10/22 1618 12/10/22 2200 12/11/22 0424 12/11/22 0927  BP: (!) 97/32 112/60 117/61 119/64  Pulse: 83 84 78 82  Resp: 16 16 16 14   Temp: (!) 97.5 F (36.4 C) 98.1 F (36.7 C) 97.7 F (36.5 C) 98.5 F (36.9 C)  TempSrc: Oral Oral  Oral  SpO2: 95% 100% 91% 93%  Weight:      Height:        Intake/Output Summary (Last 24 hours) at 12/11/2022 1203 Last data filed at 12/10/2022 1900 Gross per 24 hour  Intake 240 ml  Output --  Net 240 ml   Filed Weights   12/07/22 1200 12/09/22 0800 12/09/22 1130  Weight: 89 kg 87.7 kg 86.3 kg    Examination:  Physical Exam Constitutional:      General: She is not in acute distress.    Appearance: Normal appearance. She is obese.  Cardiovascular:     Rate and Rhythm: Normal rate and regular rhythm.  Pulmonary:     Effort: Pulmonary effort is normal.     Breath sounds: Normal breath sounds.  Abdominal:     General: Abdomen is flat.     Palpations: Abdomen  is soft.  Neurological:     Mental Status: She is alert. Mental status is at baseline.  Psychiatric:        Behavior: Behavior normal.          Scheduled Medications:   (feeding supplement) PROSource Plus  30 mL Oral BID BM   cephALEXin  500 mg Oral QHS   Chlorhexidine Gluconate Cloth  6 each Topical Q0600   cinacalcet  30 mg Oral Q supper   epoetin (EPOGEN/PROCRIT) injection  10,000 Units Intravenous Q M,W,F-HD   feeding supplement (NEPRO CARB STEADY)  237 mL Oral TID BM   fentaNYL  1 patch Transdermal Q72H   gabapentin  100 mg Oral Once per day on Mon Wed Fri   Gerhardt's butt cream   Topical QID   heparin injection (subcutaneous)  5,000 Units Subcutaneous Q8H   levETIRAcetam  1,000 mg Oral Daily   levETIRAcetam  250 mg Oral Q M,W,F   pantoprazole  20 mg Oral BID   psyllium  1 packet Oral Daily   QUEtiapine  25 mg Oral QHS   sertraline  25 mg Oral Daily   sevelamer carbonate  2.4 g Oral TID WC   sodium chloride flush  10-40 mL Intracatheter Q12H   umeclidinium-vilanterol  1 puff Inhalation Daily    Continuous Infusions:  sodium chloride Stopped (11/16/22 1155)   anticoagulant sodium citrate      PRN Medications:  alteplase, anticoagulant sodium citrate, fentaNYL (SUBLIMAZE) injection, guaiFENesin, hydrALAZINE, lidocaine (PF), lidocaine-prilocaine, LORazepam, ondansetron, phenol, senna-docusate, sodium chloride flush  Antimicrobials from admission:  Anti-infectives (From admission, onward)    Start     Dose/Rate Route Frequency Ordered Stop   12/05/22 2200  cephALEXin (KEFLEX) capsule 500 mg        500 mg Oral Daily at bedtime 12/05/22 1530 01/02/23 2159   11/18/22 1200  ceFAZolin (ANCEF) IVPB 3g/100 mL premix        3 g 200 mL/hr over 30 Minutes Intravenous Every Fri (Hemodialysis) 11/13/22 1039 12/02/22 1224   11/17/22 0000  ceFAZolin (ANCEF) IVPB 1 g/50 mL premix       Note to Pharmacy: Send with pt to OR   1 g 100 mL/hr over 30 Minutes Intravenous On call  11/16/22 1154 11/16/22 1500   11/16/22 1200  ceFAZolin (ANCEF) IVPB 2g/100 mL premix        2 g 200 mL/hr over 30  Minutes Intravenous Every Wed (Hemodialysis) 11/13/22 1039 11/30/22 1723   11/14/22 1200  ceFAZolin (ANCEF) IVPB 2g/100 mL premix        2 g 200 mL/hr over 30 Minutes Intravenous Every Mon (Hemodialysis) 11/13/22 1039 11/28/22 1151   11/11/22 2200  ceFAZolin (ANCEF) IVPB 1 g/50 mL premix        1 g 100 mL/hr over 30 Minutes Intravenous Daily at 10 pm 11/10/22 1655 11/13/22 2320   11/09/22 1200  ceFAZolin (ANCEF) IVPB 2g/100 mL premix  Status:  Discontinued        2 g 200 mL/hr over 30 Minutes Intravenous Every Wed (Hemodialysis) 11/02/22 1246 11/03/22 1111   11/07/22 1700  ceFAZolin (ANCEF) IVPB 2g/100 mL premix        2 g 200 mL/hr over 30 Minutes Intravenous  Once 11/07/22 1610 11/07/22 1827   11/07/22 1200  ceFAZolin (ANCEF) IVPB 2g/100 mL premix  Status:  Discontinued        2 g 200 mL/hr over 30 Minutes Intravenous Every Mon (Hemodialysis) 11/02/22 1246 11/10/22 1655   11/04/22 1200  ceFAZolin (ANCEF) IVPB 3g/100 mL premix  Status:  Discontinued        3 g 200 mL/hr over 30 Minutes Intravenous Every Fri (Hemodialysis) 11/02/22 1246 11/10/22 1655   11/02/22 2200  ceFAZolin (ANCEF) IVPB 1 g/50 mL premix  Status:  Discontinued        1 g 100 mL/hr over 30 Minutes Intravenous Daily at bedtime 11/01/22 1452 11/02/22 1246   11/02/22 1330  ceFAZolin (ANCEF) IVPB 2g/100 mL premix  Status:  Discontinued        2 g 200 mL/hr over 30 Minutes Intravenous Every Wed (Hemodialysis) 11/02/22 1320 11/10/22 1655   11/02/22 1200  ceFAZolin (ANCEF) IVPB 2g/100 mL premix  Status:  Discontinued        2 g 200 mL/hr over 30 Minutes Intravenous Every Wed (Hemodialysis) 10/28/22 1004 11/01/22 1452   10/31/22 1200  ceFAZolin (ANCEF) IVPB 2g/100 mL premix  Status:  Discontinued        2 g 200 mL/hr over 30 Minutes Intravenous Every Mon (Hemodialysis) 10/28/22 1004 11/01/22 1452   10/28/22  1200  ceFAZolin (ANCEF) IVPB 3g/100 mL premix  Status:  Discontinued        3 g 200 mL/hr over 30 Minutes Intravenous Every Fri (Hemodialysis) 10/28/22 1004 11/01/22 1452   10/19/22 2200  ceFAZolin (ANCEF) IVPB 1 g/50 mL premix  Status:  Discontinued        1 g 100 mL/hr over 30 Minutes Intravenous Every 24 hours 10/18/22 0834 10/28/22 1004   10/18/22 1000  ceFAZolin (ANCEF) IVPB 2g/100 mL premix        2 g 200 mL/hr over 30 Minutes Intravenous  Once 10/18/22 0834 10/18/22 0946   10/17/22 1400  ceFEPIme (MAXIPIME) 1 g in sodium chloride 0.9 % 100 mL IVPB  Status:  Discontinued        1 g 200 mL/hr over 30 Minutes Intravenous Every 24 hours 10/17/22 1252 10/17/22 1520   10/14/22 1200  vancomycin (VANCOCIN) IVPB 1000 mg/200 mL premix  Status:  Discontinued        1,000 mg 200 mL/hr over 60 Minutes Intravenous Every M-W-F (Hemodialysis) 10/13/22 1356 10/19/22 0809   10/13/22 1400  ceFEPIme (MAXIPIME) 1 g in sodium chloride 0.9 % 100 mL IVPB  Status:  Discontinued        1 g 200 mL/hr over 30 Minutes Intravenous Every 24 hours 10/13/22 1140 10/17/22  1223   10/13/22 1230  vancomycin (VANCOREADY) IVPB 2000 mg/400 mL        2,000 mg 200 mL/hr over 120 Minutes Intravenous  Once 10/13/22 1140 10/13/22 1437   10/12/22 1645  vancomycin (VANCOREADY) IVPB 1500 mg/300 mL  Status:  Discontinued       See Hyperspace for full Linked Orders Report.   1,500 mg 150 mL/hr over 120 Minutes Intravenous  Once 10/12/22 1110 10/12/22 1753   10/12/22 1600  ceFEPIme (MAXIPIME) 1 g in sodium chloride 0.9 % 100 mL IVPB  Status:  Discontinued        1 g 200 mL/hr over 30 Minutes Intravenous Every 24 hours 10/12/22 1112 10/12/22 1753   10/12/22 1500  vancomycin (VANCOCIN) IVPB 1000 mg/200 mL premix  Status:  Discontinued       See Hyperspace for full Linked Orders Report.   1,000 mg 200 mL/hr over 60 Minutes Intravenous  Once 10/12/22 1110 10/12/22 1753   10/12/22 1115  ceFEPIme (MAXIPIME) 1 g in sodium chloride 0.9  % 100 mL IVPB  Status:  Discontinued        1 g 200 mL/hr over 30 Minutes Intravenous  Once 10/12/22 1110 10/12/22 1112           Data Reviewed:  I have personally reviewed the following...  CBC: Recent Labs  Lab 12/05/22 1037 12/07/22 0416 12/08/22 0513 12/09/22 0457 12/09/22 0804  WBC 5.5 5.6 8.0 7.8 7.6  NEUTROABS  --  2.2 4.1 4.9  --   HGB 8.6* 8.6* 8.9* 8.7* 8.8*  HCT 28.7* 28.2* 29.3* 28.8* 29.6*  MCV 83.7 83.4 83.7 83.5 84.6  PLT 221 213 207 224 230   Basic Metabolic Panel: Recent Labs  Lab 12/05/22 1037 12/07/22 0416 12/09/22 0804  NA 136 134* 135  K 3.9 3.7 3.5  CL 98 96* 97*  CO2 27 28 27   GLUCOSE 105* 92 112*  BUN 43* 28* 29*  CREATININE 6.85* 5.95* 6.53*  CALCIUM 10.0 9.5 10.1  PHOS 5.8*  --  6.3*   GFR: Estimated Creatinine Clearance: 9.1 mL/min (A) (by C-G formula based on SCr of 6.53 mg/dL (H)). Liver Function Tests: Recent Labs  Lab 12/05/22 1037 12/09/22 0804  ALBUMIN 2.2* 2.3*   No results for input(s): "LIPASE", "AMYLASE" in the last 168 hours. No results for input(s): "AMMONIA" in the last 168 hours. Coagulation Profile: No results for input(s): "INR", "PROTIME" in the last 168 hours. Cardiac Enzymes: No results for input(s): "CKTOTAL", "CKMB", "CKMBINDEX", "TROPONINI" in the last 168 hours. BNP (last 3 results) No results for input(s): "PROBNP" in the last 8760 hours. HbA1C: No results for input(s): "HGBA1C" in the last 72 hours. CBG: No results for input(s): "GLUCAP" in the last 168 hours. Lipid Profile: No results for input(s): "CHOL", "HDL", "LDLCALC", "TRIG", "CHOLHDL", "LDLDIRECT" in the last 72 hours. Thyroid Function Tests: No results for input(s): "TSH", "T4TOTAL", "FREET4", "T3FREE", "THYROIDAB" in the last 72 hours. Anemia Panel: No results for input(s): "VITAMINB12", "FOLATE", "FERRITIN", "TIBC", "IRON", "RETICCTPCT" in the last 72 hours. Most Recent Urinalysis On File:     Component Value Date/Time    COLORURINE YELLOW (A) 03/20/2022 0125   APPEARANCEUR CLEAR (A) 03/20/2022 0125   APPEARANCEUR Clear 06/14/2014 1510   LABSPEC 1.010 03/20/2022 0125   LABSPEC 1.006 06/14/2014 1510   PHURINE 7.0 03/20/2022 0125   GLUCOSEU 50 (A) 03/20/2022 0125   GLUCOSEU >=500 06/14/2014 1510   HGBUR MODERATE (A) 03/20/2022 0125   BILIRUBINUR NEGATIVE 03/20/2022 0125  BILIRUBINUR Negative 06/14/2014 1510   KETONESUR NEGATIVE 03/20/2022 0125   PROTEINUR 100 (A) 03/20/2022 0125   NITRITE NEGATIVE 03/20/2022 0125   LEUKOCYTESUR NEGATIVE 03/20/2022 0125   LEUKOCYTESUR Negative 06/14/2014 1510   Sepsis Labs: @LABRCNTIP (procalcitonin:4,lacticidven:4) Microbiology: No results found for this or any previous visit (from the past 240 hour(s)).    Radiology Studies last 3 days: No results found.           LOS: 60 days      Sunnie Nielsen, DO [facility] 12/11/2022, 12:03 PM    Dictation software may have been used to generate the above note. Typos may occur and escape review in typed/dictated notes. Please contact Dr Lyn Hollingshead directly for clarity if needed.  Staff may message me via secure chat in Epic  but this may not receive an immediate response,  please page me for urgent matters!  If 7PM-7AM, please contact night coverage [email or web address]

## 2022-12-11 NOTE — Plan of Care (Signed)
  Problem: Education: Goal: Ability to describe self-care measures that may prevent or decrease complications (Diabetes Survival Skills Education) will improve Outcome: Progressing   Problem: Skin Integrity: Goal: Risk for impaired skin integrity will decrease Outcome: Progressing   

## 2022-12-11 NOTE — Progress Notes (Signed)
[facility]  PROGRESS NOTE   Subjective:   Patient is pleasantly confused. She states she is in her mother's home and that I am her friend.   Patient states she does not want to dialysis tomorrow.   Objective:  Vital signs: Blood pressure 119/64, pulse 82, temperature 98.5 F (36.9 C), temperature source Oral, resp. rate 14, height 6' (1.829 m), weight 86.3 kg, SpO2 93 %.  Intake/Output Summary (Last 24 hours) at 12/11/2022 1229 Last data filed at 12/10/2022 1900 Gross per 24 hour  Intake 240 ml  Output --  Net 240 ml    Filed Weights   12/07/22 1200 12/09/22 0800 12/09/22 1130  Weight: 89 kg 87.7 kg 86.3 kg     Physical Exam: General:  No acute distress, laying in bed  Head:  Normocephalic, atraumatic. Moist oral mucosal membranes  Eyes:  Anicteric  Neck:  Supple  Lungs:   Clear to auscultation, normal effort  Heart:  regular  Abdomen:   Soft, nontender, bowel sounds present  Extremities:  peripheral edema.  Neurologic:  Alert to self only  Skin:  No lesions  Access:  LIJ permcath    Basic Metabolic Panel: Recent Labs  Lab 12/05/22 1037 12/07/22 0416 12/09/22 0804  NA 136 134* 135  K 3.9 3.7 3.5  CL 98 96* 97*  CO2 27 28 27   GLUCOSE 105* 92 112*  BUN 43* 28* 29*  CREATININE 6.85* 5.95* 6.53*  CALCIUM 10.0 9.5 10.1  PHOS 5.8*  --  6.3*    GFR: Estimated Creatinine Clearance: 9.1 mL/min (A) (by C-G formula based on SCr of 6.53 mg/dL (H)).  Liver Function Tests: Recent Labs  Lab 12/05/22 1037 12/09/22 0804  ALBUMIN 2.2* 2.3*    No results for input(s): "LIPASE", "AMYLASE" in the last 168 hours. No results for input(s): "AMMONIA" in the last 168 hours.  CBC: Recent Labs  Lab 12/05/22 1037 12/07/22 0416 12/08/22 0513 12/09/22 0457 12/09/22 0804  WBC 5.5 5.6 8.0 7.8 7.6  NEUTROABS  --  2.2 4.1 4.9  --   HGB 8.6* 8.6* 8.9* 8.7* 8.8*  HCT 28.7* 28.2* 29.3* 28.8* 29.6*  MCV 83.7 83.4 83.7 83.5 84.6  PLT 221 213 207 224 230       HbA1C: Hemoglobin A1C  Date/Time Value Ref Range Status  06/15/2014 12:58 AM 7.9 (H) 4.2 - 6.3 % Final    Comment:    The American Diabetes Association recommends that a primary goal of therapy should be <7% and that physicians should reevaluate the treatment regimen in patients with HbA1c values consistently >8%.   12/24/2013 11:18 AM 8.2 (H) 4.2 - 6.3 % Final    Comment:    The American Diabetes Association recommends that a primary goal of therapy should be <7% and that physicians should reevaluate the treatment regimen in patients with HbA1c values consistently >8%.    Hgb A1c MFr Bld  Date/Time Value Ref Range Status  10/13/2022 11:46 AM 5.2 4.8 - 5.6 % Final    Comment:    (NOTE)         Prediabetes: 5.7 - 6.4         Diabetes: >6.4         Glycemic control for adults with diabetes: <7.0   03/23/2022 06:47 AM 5.8 (H) 4.8 - 5.6 % Final    Comment:    (NOTE) Pre diabetes:          5.7%-6.4%  Diabetes:              >  6.4%  Glycemic control for   <7.0% adults with diabetes     Urinalysis: No results for input(s): "COLORURINE", "LABSPEC", "PHURINE", "GLUCOSEU", "HGBUR", "BILIRUBINUR", "KETONESUR", "PROTEINUR", "UROBILINOGEN", "NITRITE", "LEUKOCYTESUR" in the last 72 hours.  Invalid input(s): "APPERANCEUR"    Imaging: No results found.   Medications:    sodium chloride Stopped (11/16/22 1155)   anticoagulant sodium citrate      (feeding supplement) PROSource Plus  30 mL Oral BID BM   cephALEXin  500 mg Oral QHS   Chlorhexidine Gluconate Cloth  6 each Topical Q0600   cinacalcet  30 mg Oral Q supper   epoetin (EPOGEN/PROCRIT) injection  10,000 Units Intravenous Q M,W,F-HD   feeding supplement (NEPRO CARB STEADY)  237 mL Oral TID BM   fentaNYL  1 patch Transdermal Q72H   gabapentin  100 mg Oral Once per day on Mon Wed Fri   Gerhardt's butt cream   Topical QID   heparin injection (subcutaneous)  5,000 Units Subcutaneous Q8H   levETIRAcetam  1,000 mg  Oral Daily   levETIRAcetam  250 mg Oral Q M,W,F   pantoprazole  20 mg Oral BID   psyllium  1 packet Oral Daily   QUEtiapine  25 mg Oral QHS   sertraline  25 mg Oral Daily   sevelamer carbonate  2.4 g Oral TID WC   sodium chloride flush  10-40 mL Intracatheter Q12H   umeclidinium-vilanterol  1 puff Inhalation Daily    Assessment/ Plan:     [name] is a 72 y.o. female with end stage renal disease on hemodialysis, CAD, COPD with intermittent home O2, hypertension, hyperlipidemia, stroke, diabetes who has been admitted since 3/20. Initially admitted for vertebral osteomyelitis. She has completed IV antibiotics. Now is working on placement.   [facility]J permcath   #1 ESRD:  - next hemodailysis treatment for Monday if patient agreeable.  - Continue MWF schedule   #2: Anemia with chronic kidney disease: hemoglobin 8.8 - epo dose to 10000 units q treatment.    #3: Second hyperparathyroidism: with hyperphosphatemia and calcium upper level on goal. PTH 157 - lower end of goal.  - Continue cinacalcet  - Continue sevelamer with meals.    #4: Hypertension: 119/64. Not currently on any blood pressure agents.    LOS: 60 Lamont Dowdy, MD [facility] 5/19/202412:29 PM

## 2022-12-12 DIAGNOSIS — E1142 Type 2 diabetes mellitus with diabetic polyneuropathy: Secondary | ICD-10-CM | POA: Diagnosis not present

## 2022-12-12 DIAGNOSIS — M5442 Lumbago with sciatica, left side: Secondary | ICD-10-CM | POA: Diagnosis not present

## 2022-12-12 DIAGNOSIS — D649 Anemia, unspecified: Secondary | ICD-10-CM | POA: Diagnosis not present

## 2022-12-12 DIAGNOSIS — M79604 Pain in right leg: Secondary | ICD-10-CM | POA: Diagnosis not present

## 2022-12-12 LAB — RENAL FUNCTION PANEL
Albumin: 2.5 g/dL — ABNORMAL LOW (ref 3.5–5.0)
Anion gap: 12 (ref 5–15)
BUN: 42 mg/dL — ABNORMAL HIGH (ref 8–23)
CO2: 28 mmol/L (ref 22–32)
Calcium: 10.5 mg/dL — ABNORMAL HIGH (ref 8.9–10.3)
Chloride: 97 mmol/L — ABNORMAL LOW (ref 98–111)
Creatinine, Ser: 8.15 mg/dL — ABNORMAL HIGH (ref 0.44–1.00)
GFR, Estimated: 5 mL/min — ABNORMAL LOW (ref >60)
Glucose, Bld: 126 mg/dL — ABNORMAL HIGH (ref 70–99)
Phosphorus: 5.8 mg/dL — ABNORMAL HIGH (ref 2.5–4.6)
Potassium: 3.6 mmol/L (ref 3.5–5.1)
Sodium: 137 mmol/L (ref 135–145)

## 2022-12-12 LAB — CBC
HCT: 30 % — ABNORMAL LOW (ref 36.0–46.0)
Hemoglobin: 9 g/dL — ABNORMAL LOW (ref 12.0–15.0)
MCH: 25.8 pg — ABNORMAL LOW (ref 26.0–34.0)
MCHC: 30 g/dL (ref 30.0–36.0)
MCV: 86 fL (ref 80.0–100.0)
Platelets: 231 10*3/uL (ref 150–400)
RBC: 3.49 MIL/uL — ABNORMAL LOW (ref 3.87–5.11)
RDW: 18.8 % — ABNORMAL HIGH (ref 11.5–15.5)
WBC: 6.4 10*3/uL (ref 4.0–10.5)
nRBC: 0 % (ref 0.0–0.2)

## 2022-12-12 MED ORDER — ALTEPLASE 2 MG IJ SOLR
2.0000 mg | Freq: Once | INTRAMUSCULAR | Status: AC | PRN
  Administered 2022-12-12: 2 mg

## 2022-12-12 MED ORDER — STERILE WATER FOR INJECTION IJ SOLN
INTRAMUSCULAR | Status: AC
  Filled 2022-12-12: qty 10

## 2022-12-12 MED ORDER — ALTEPLASE 2 MG IJ SOLR
INTRAMUSCULAR | Status: AC
  Filled 2022-12-12: qty 4

## 2022-12-12 MED ORDER — EPOETIN ALFA 10000 UNIT/ML IJ SOLN
INTRAMUSCULAR | Status: AC
  Filled 2022-12-12: qty 1

## 2022-12-12 NOTE — TOC Progression Note (Addendum)
Transition of Care [facility]) - Progression Note    Patient Details  Name: Cynthia Dean MRN: [id number] Date of Birth: 1951/07/11  Transition of Care [facility]) CM/SW Contact  Allena Katz, LCSW Phone Number: 12/12/2022, 3:25 PM  Clinical Narrative:   CSW waiting for ethics consult to determine care plan for patient and her dialysis. CSW attempted to contact Zettie Cooley [phone],  with [facility] ethics but was unable to leave a VM as her voicemail was full. Email also sent to [facility].    Note from [facility] on 5/17 shows patient actively declining wanting dialysis and appearing in distress.   For home to be an option the following would need to happen: Per Dimas Chyle, Dialysis Coordinator.   Currently looking into HHD as an option, due to barrier of not being able to tolerate sitting in a recliner for HD. Spoke with [place], Home FA, she stated that there were somethings that needed consideration: 1. Pt will need a bed with a scale 2. Pt will have to come once a week for MD visits 3. One maybe two persons have to commit to giving tx 4 times a week 4. The earliest training will be in July    Attempted to call Selena Batten [facility]) to discuss the possibility of Home Hemodialysis and what that would look like. No answer, left VM for a call back.          Expected Discharge Plan and Services                                               Social Determinants of Health (SDOH) Interventions SDOH Screenings   Food Insecurity: No Food Insecurity (10/12/2022)  Housing: Low Risk  (10/12/2022)  Transportation Needs: No Transportation Needs (10/12/2022)  Utilities: Not At Risk (10/12/2022)  Financial Resource Strain: Low Risk  (11/12/2017)  Physical Activity: Insufficiently Active (11/12/2017)  Social Connections: Moderately Integrated (11/12/2017)  Stress: No Stress Concern Present (11/12/2017)  Tobacco Use: Medium Risk (11/16/2022)    Readmission Risk Interventions     No data to  display

## 2022-12-12 NOTE — Progress Notes (Signed)
Very poor push/pull from arterial port. Unable to start dialysis tx.  Activase instilled to both ports.  Provider made aware.

## 2022-12-12 NOTE — Progress Notes (Signed)
  Received patient in bed to unit.   Informed consent signed and in chart.    TX duration: 12 min.  Pt unable to complete tx d/t catheter not  working.  Activase instilled for 1 hr and was able to start tx.  Bfr not able to achieve over 300.  Venous pressures high at that bfr.  Pt starting to clot.  Provider made aware and decision was to instill activase in catheter to let dwell overnight and bring pt back in the morning for dialysis.  Floor nurse notified of above .       Transported back to floor  Hand-off given to patient's nurse.    Access used: L HD catheter  Access issues: unable to dialyze    Total UF removed: 0 Medication(s) given: 10,000u epogen, 4mg  activase  Post HD VS: 133/66 Post HD weight: 88.8kg     Lynann Beaver  [facility]

## 2022-12-12 NOTE — Progress Notes (Signed)
[facility]  PROGRESS NOTE   Subjective:   Patient seen and evaluated during dialysis   HEMODIALYSIS FLOWSHEET:  Blood Flow Rate (mL/min): 300 mL/min Arterial Pressure (mmHg): -140 mmHg Venous Pressure (mmHg): 260 mmHg TMP (mmHg): 0 mmHg Ultrafiltration Rate (mL/min): 914 mL/min Dialysate Flow Rate (mL/min): 300 ml/min Dialysis Fluid Bolus: Normal Saline Bolus Amount (mL): 500 mL  Patient alert and oriented to self Agreeable to dialysis  Objective:  Vital signs: Blood pressure 133/66, pulse 79, temperature 97.7 F (36.5 C), temperature source Oral, resp. rate 14, height 6' (1.829 m), weight 88.8 kg, SpO2 93 %.  Intake/Output Summary (Last 24 hours) at 12/12/2022 1636 Last data filed at 12/12/2022 1100 Gross per 24 hour  Intake --  Output 0 ml  Net 0 ml    Filed Weights   12/09/22 1130 12/12/22 0848 12/12/22 1118  Weight: 86.3 kg 88.8 kg 88.8 kg     Physical Exam: General:  No acute distress, laying in bed  Head:  Normocephalic, atraumatic. Moist oral mucosal membranes  Eyes:  Anicteric  Neck:  Supple  Lungs:   Clear to auscultation, normal effort  Heart:  regular  Abdomen:   Soft, nontender, bowel sounds present  Extremities:  No peripheral edema.  Neurologic:  Alert to self only  Skin:  No lesions  Access:  LIJ permcath    Basic Metabolic Panel: Recent Labs  Lab 12/07/22 0416 12/09/22 0804 12/12/22 0917  NA 134* 135 137  K 3.7 3.5 3.6  CL 96* 97* 97*  CO2 28 27 28   GLUCOSE 92 112* 126*  BUN 28* 29* 42*  CREATININE 5.95* 6.53* 8.15*  CALCIUM 9.5 10.1 10.5*  PHOS  --  6.3* 5.8*    GFR: Estimated Creatinine Clearance: 7.9 mL/min (A) (by C-G formula based on SCr of 8.15 mg/dL (H)).  Liver Function Tests: Recent Labs  Lab 12/09/22 0804 12/12/22 0917  ALBUMIN 2.3* 2.5*    No results for input(s): "LIPASE", "AMYLASE" in the last 168 hours. No results for input(s): "AMMONIA" in the last 168 hours.  CBC: Recent Labs  Lab  12/07/22 0416 12/08/22 0513 12/09/22 0457 12/09/22 0804 12/12/22 0917  WBC 5.6 8.0 7.8 7.6 6.4  NEUTROABS 2.2 4.1 4.9  --   --   HGB 8.6* 8.9* 8.7* 8.8* 9.0*  HCT 28.2* 29.3* 28.8* 29.6* 30.0*  MCV 83.4 83.7 83.5 84.6 86.0  PLT 213 207 224 230 231      HbA1C: Hemoglobin A1C  Date/Time Value Ref Range Status  06/15/2014 12:58 AM 7.9 (H) 4.2 - 6.3 % Final    Comment:    The American Diabetes Association recommends that a primary goal of therapy should be <7% and that physicians should reevaluate the treatment regimen in patients with HbA1c values consistently >8%.   12/24/2013 11:18 AM 8.2 (H) 4.2 - 6.3 % Final    Comment:    The American Diabetes Association recommends that a primary goal of therapy should be <7% and that physicians should reevaluate the treatment regimen in patients with HbA1c values consistently >8%.    Hgb A1c MFr Bld  Date/Time Value Ref Range Status  10/13/2022 11:46 AM 5.2 4.8 - 5.6 % Final    Comment:    (NOTE)         Prediabetes: 5.7 - 6.4         Diabetes: >6.4         Glycemic control for adults with diabetes: <7.0   03/23/2022 06:47 AM 5.8 (  H) 4.8 - 5.6 % Final    Comment:    (NOTE) Pre diabetes:          5.7%-6.4%  Diabetes:              >6.4%  Glycemic control for   <7.0% adults with diabetes     Urinalysis: No results for input(s): "COLORURINE", "LABSPEC", "PHURINE", "GLUCOSEU", "HGBUR", "BILIRUBINUR", "KETONESUR", "PROTEINUR", "UROBILINOGEN", "NITRITE", "LEUKOCYTESUR" in the last 72 hours.  Invalid input(s): "APPERANCEUR"    Imaging: No results found.   Medications:    sodium chloride Stopped (11/16/22 1155)   anticoagulant sodium citrate      (feeding supplement) PROSource Plus  30 mL Oral BID BM   cephALEXin  500 mg Oral QHS   Chlorhexidine Gluconate Cloth  6 each Topical Q0600   cinacalcet  30 mg Oral Q supper   epoetin (EPOGEN/PROCRIT) injection  10,000 Units Intravenous Q M,W,F-HD   feeding supplement (NEPRO  CARB STEADY)  237 mL Oral TID BM   fentaNYL  1 patch Transdermal Q72H   gabapentin  100 mg Oral Once per day on Mon Wed Fri   Gerhardt's butt cream   Topical QID   heparin injection (subcutaneous)  5,000 Units Subcutaneous Q8H   levETIRAcetam  1,000 mg Oral Daily   levETIRAcetam  250 mg Oral Q M,W,F   pantoprazole  20 mg Oral BID   psyllium  1 packet Oral Daily   QUEtiapine  25 mg Oral QHS   sertraline  25 mg Oral Daily   sevelamer carbonate  2.4 g Oral TID WC   sodium chloride flush  10-40 mL Intracatheter Q12H   umeclidinium-vilanterol  1 puff Inhalation Daily    Assessment/ Plan:     [name] is a 72 y.o. female with end stage renal disease on hemodialysis, CAD, COPD with intermittent home O2, hypertension, hyperlipidemia, stroke, diabetes who has been admitted since 3/20. Initially admitted for vertebral osteomyelitis. She has completed IV antibiotics. Now is working on placement.   Due to extended hospitalization, patient will need outpatient dialysis clinic re-initiated.   [facility] TTS [facility] permcath   #1 ESRD:  - Attempted dialysis today, malfunctioning permcath. Will instill Cathflo overnight and reattempt treatment in am.  - Next treatment scheduled for Wednesday.   #2: Anemia with chronic kidney disease: hemoglobin 8.8 - Continue 16109 units q treatment.    #3: Second hyperparathyroidism: with hyperphosphatemia and calcium upper level on goal. PTH 157 - lower end of goal.  - Continue cinacalcet  - Continue sevelamer with meals.    #4: Hypertension Not currently on any blood pressure agents.    LOS: [address] [facility] 5/20/20244:36 PM

## 2022-12-12 NOTE — Plan of Care (Signed)

## 2022-12-12 NOTE — Progress Notes (Signed)
PROGRESS NOTE    Cynthia Dean   ZOX:[id number] DOB: 1951-02-09  DOA: 10/12/2022 Date of Service: 12/12/22 PCP: Loistine Chance, MD     Brief Narrative / Hospital Course:  Cynthia Dean is a 72 y.o. 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 She presents to the ED with worsening lower extremity pain. Admitted for possible Discitis/osteomyelitis at L4-L5: w/ suspected intradiscal abscess as per MRI. 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   4/18, HD access / fistula clotted off during dialysis. 4/19 pt taken for procedure to address fistula malfunction, but was agitated and combative, resfused procedure. 4/19 vascular placed R femoral temp HD cath placed, resumed HD and pemcath placed 4/24  Patient has had waxing and waning mental status with intermittently refusing dialysis. Patient's family have decided to continue hemodialysis after speaking with their PCP. Hospital course prolonged, complicated by patient's inability to tolerate sitting in recliner chair for dialysis.  She only agrees to dialysis in the bed.  This is a barrier to discharge as no outpatient dialysis centers can accommodate.      5/3: titrating up on fentanyl patch 12.5 --> 25 mcg, seems to be helping, if still unable to sit up in chair may consider increasing dose further   5/14- back pain seems to be well-controlled.  Caseworkers have discussed the case, will involve ethics committee/Hospital administration. Looking into home dialysis.   5/19 - d/w patient, she states she doesn't want dialysis but cannot give a reason/explanation. I She voices she wants to live and that she needs dialysis to live. Lacks capacity to make informed decision. Continue defer to HCPOA.    Consultants:  Neurosurgery Infectious  Disease Vascular surgery  Nephrology   Procedures: 09/22/22: Stent placement to the AV graft  10/13/22: CT aspiration of right psoas muscle abscess yielding 2 ml purulent material  11/11/22: R Femoral V catheter placement 11/16/22: Permcath placement to L IJ       ASSESSMENT & PLAN:   Principal Problem:   Back pain Active Problems:   Anemia   Bilateral leg pain   ESRD on dialysis [facility])   Essential hypertension   Dyslipidemia   Seizure disorder (HCC)   Diabetic retinopathy without macular edema associated with type 2 diabetes mellitus (HCC)   GERD (gastroesophageal reflux disease)   Diabetic neuropathy (HCC)   Anemia of chronic disease   Insulin-requiring or dependent type II diabetes mellitus (HCC)   Discitis of lumbar region   Myositis   Psoas abscess (HCC)   Lumbar discitis, 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   Wound cx growing staph capitis.   ID consulted.  Abx de-escalated to cefazolin.   Not a good surgical candidate as per neuro surg.   Pt having severe low back and bilateral leg pain and has not been able to tolerate any position except laying nearly flat in the bed S/p cefazolin, for 6 weeks, End Date: 12/02/22 pain management per orders -- palliative care following peripherally 5/3: titrate up on fentanyl patch 12.5 --> 25 mcg - pt reports pain is improved, but still unable to tolerate sitting in chair for dialysis    Confusion and agitation - improved Hospital delirium  Pt was  agitated, and also not responding to questions or commands during early hospitalization.  Mental status did start to improve, and now pt is calm and interactive, however, at this time she has been having waxing and waning mental status Delirium precautions cont seroquel 50 mg nightly to help with delirium and sleep.   ESRD:  on HD MWF.  Pt refused HD on 10/17/22, 10/18/22 and 3/27, 4/1.   When attempts made to do HD in chair, she did not tolerate on account of severe back pain Recommend consideration for home hemodialysis if no facility capable to do HD in the bed Pt has voiced on occasion that she does not want dialysis and has been intermittently refusing this. Family encourages her to complete HD.  Ethics consult placed to discuss appropriate decision-making but in my judgment patient lacks capacity to give informed consent/refusal and the appropriate surrogate decision maker is involved (HCPOA is daughter) so while the situation is frustrating we do not have strong evidence that the patient, while lucid, was refusing dialysis consistently.    Generalized weakness / Physical Debility:  PT recs SNF. TOC following. Pt is bedridden and very high risk for developing pressure injuries of skin -- reposition frequently.   Monitor pressures areas on skin and offload as possible.   Severe back and bilateral leg pain due to severe multi-level lumbar degenerative disc disease. Pt has bulging discs at L2-3, L3-4, L4-5 and L5-S1. Pt will continue to have back and leg pain once antibiotics are complete. Pain mgmt per orders  Pain control complicated by AMS and hypotension with escalation of opiate dose - titrated up on fentanyl patch slowly and 25 mcg seems to be helpful, cautious use of IV fentanyl for breakthrough pain    ACD:  likely secondary ESRD. H&H are stable  Monitor periodic CBC   Hypokalemia Replace as needed Monitor BMP   Peripheral neuropathy --  Continue low dose gabapentin and monitor   Hx of seizures:  continue on home dose of Keppra   HLD:  continue on statin    Hx of COPD: w/o exacerbation.  Continue on bronchodilators    DM2: well controlled HbA1c 5.2.  d/c'ed BG checks    HTN:  Antihypertensives as blood pressure allows   GERD:  continue on PPI    Depression: severity unknown.   Continue on home dose of sertraline     Overweight: BMI 28.2.  Wt loss efforts recommended   Right foot pain  see peripheral neuropathy   DVT prophylaxis: heparin sq Pertinent IV fluids/nutrition: no continuous IV fluids  Central lines / invasive devices: L IJ HD cath, port R chest   Code Status: DNR ACP documentation reviewed: 05/01 and 05/16- MOST and advanced directive reviewed, [name] is HCPOA  Current Admission Status: inpatient  TOC needs / Dispo plan: SNF Barriers to discharge / significant pending items: unable to sit to toelrate outpatient HD, working w/ PT, TOC involved             Subjective / Brief ROS:  No concerns, pt resting comfortably in dialysis suite, alert, denies back pain at the moment    Family Communication: none at this time     Objective Findings:  Vitals:   12/12/22 0849 12/12/22 1031 12/12/22 1100 12/12/22 1118  BP: 138/88 (!) 122/54 133/66   Pulse: 77 79 79   Resp: 13 14 14    Temp: 97.6 F (36.4 C)  97.7 F (36.5 C)   TempSrc: Oral  Oral  SpO2: 96% 92% 93%   Weight:    88.8 kg  Height:        Intake/Output Summary (Last 24 hours) at 12/12/2022 1223 Last data filed at 12/12/2022 1100 Gross per 24 hour  Intake --  Output 0 ml  Net 0 ml   Filed Weights   12/09/22 1130 12/12/22 0848 12/12/22 1118  Weight: 86.3 kg 88.8 kg 88.8 kg    Examination:  Physical Exam Constitutional:      General: She is not in acute distress.    Appearance: Normal appearance. She is obese.  Cardiovascular:     Rate and Rhythm: Normal rate and regular rhythm.  Pulmonary:     Effort: Pulmonary effort is normal.     Breath sounds: Normal breath sounds.  Abdominal:     General: Abdomen is flat.     Palpations: Abdomen is soft.  Neurological:     Mental Status: She is alert. Mental status is at baseline.  Psychiatric:        Behavior: Behavior normal.          Scheduled Medications:   (feeding supplement) PROSource Plus   30 mL Oral BID BM   cephALEXin  500 mg Oral QHS   Chlorhexidine Gluconate Cloth  6 each Topical Q0600   cinacalcet  30 mg Oral Q supper   epoetin (EPOGEN/PROCRIT) injection  10,000 Units Intravenous Q M,W,F-HD   feeding supplement (NEPRO CARB STEADY)  237 mL Oral TID BM   fentaNYL  1 patch Transdermal Q72H   gabapentin  100 mg Oral Once per day on Mon Wed Fri   Gerhardt's butt cream   Topical QID   heparin injection (subcutaneous)  5,000 Units Subcutaneous Q8H   levETIRAcetam  1,000 mg Oral Daily   levETIRAcetam  250 mg Oral Q M,W,F   pantoprazole  20 mg Oral BID   psyllium  1 packet Oral Daily   QUEtiapine  25 mg Oral QHS   sertraline  25 mg Oral Daily   sevelamer carbonate  2.4 g Oral TID WC   sodium chloride flush  10-40 mL Intracatheter Q12H   umeclidinium-vilanterol  1 puff Inhalation Daily    Continuous Infusions:  sodium chloride Stopped (11/16/22 1155)   anticoagulant sodium citrate      PRN Medications:  anticoagulant sodium citrate, fentaNYL (SUBLIMAZE) injection, guaiFENesin, hydrALAZINE, lidocaine (PF), lidocaine-prilocaine, LORazepam, ondansetron, phenol, senna-docusate, sodium chloride flush  Antimicrobials from admission:  Anti-infectives (From admission, onward)    Start     Dose/Rate Route Frequency Ordered Stop   12/05/22 2200  cephALEXin (KEFLEX) capsule 500 mg        500 mg Oral Daily at bedtime 12/05/22 1530 01/02/23 2159   11/18/22 1200  ceFAZolin (ANCEF) IVPB 3g/100 mL premix        3 g 200 mL/hr over 30 Minutes Intravenous Every Fri (Hemodialysis) 11/13/22 1039 12/02/22 1224   11/17/22 0000  ceFAZolin (ANCEF) IVPB 1 g/50 mL premix       Note to Pharmacy: Send with pt to OR   1 g 100 mL/hr over 30 Minutes Intravenous On call 11/16/22 1154 11/16/22 1500   11/16/22 1200  ceFAZolin (ANCEF) IVPB 2g/100 mL premix        2 g 200 mL/hr over 30 Minutes Intravenous Every Wed (Hemodialysis) 11/13/22 1039 11/30/22 1723   11/14/22 1200  ceFAZolin (ANCEF) IVPB  2g/100 mL premix        2 g 200 mL/hr over 30 Minutes Intravenous Every Mon (  Hemodialysis) 11/13/22 1039 11/28/22 1151   11/11/22 2200  ceFAZolin (ANCEF) IVPB 1 g/50 mL premix        1 g 100 mL/hr over 30 Minutes Intravenous Daily at 10 pm 11/10/22 1655 11/13/22 2320   11/09/22 1200  ceFAZolin (ANCEF) IVPB 2g/100 mL premix  Status:  Discontinued        2 g 200 mL/hr over 30 Minutes Intravenous Every Wed (Hemodialysis) 11/02/22 1246 11/03/22 1111   11/07/22 1700  ceFAZolin (ANCEF) IVPB 2g/100 mL premix        2 g 200 mL/hr over 30 Minutes Intravenous  Once 11/07/22 1610 11/07/22 1827   11/07/22 1200  ceFAZolin (ANCEF) IVPB 2g/100 mL premix  Status:  Discontinued        2 g 200 mL/hr over 30 Minutes Intravenous Every Mon (Hemodialysis) 11/02/22 1246 11/10/22 1655   11/04/22 1200  ceFAZolin (ANCEF) IVPB 3g/100 mL premix  Status:  Discontinued        3 g 200 mL/hr over 30 Minutes Intravenous Every Fri (Hemodialysis) 11/02/22 1246 11/10/22 1655   11/02/22 2200  ceFAZolin (ANCEF) IVPB 1 g/50 mL premix  Status:  Discontinued        1 g 100 mL/hr over 30 Minutes Intravenous Daily at bedtime 11/01/22 1452 11/02/22 1246   11/02/22 1330  ceFAZolin (ANCEF) IVPB 2g/100 mL premix  Status:  Discontinued        2 g 200 mL/hr over 30 Minutes Intravenous Every Wed (Hemodialysis) 11/02/22 1320 11/10/22 1655   11/02/22 1200  ceFAZolin (ANCEF) IVPB 2g/100 mL premix  Status:  Discontinued        2 g 200 mL/hr over 30 Minutes Intravenous Every Wed (Hemodialysis) 10/28/22 1004 11/01/22 1452   10/31/22 1200  ceFAZolin (ANCEF) IVPB 2g/100 mL premix  Status:  Discontinued        2 g 200 mL/hr over 30 Minutes Intravenous Every Mon (Hemodialysis) 10/28/22 1004 11/01/22 1452   10/28/22 1200  ceFAZolin (ANCEF) IVPB 3g/100 mL premix  Status:  Discontinued        3 g 200 mL/hr over 30 Minutes Intravenous Every Fri (Hemodialysis) 10/28/22 1004 11/01/22 1452   10/19/22 2200  ceFAZolin (ANCEF) IVPB 1 g/50 mL premix   Status:  Discontinued        1 g 100 mL/hr over 30 Minutes Intravenous Every 24 hours 10/18/22 0834 10/28/22 1004   10/18/22 1000  ceFAZolin (ANCEF) IVPB 2g/100 mL premix        2 g 200 mL/hr over 30 Minutes Intravenous  Once 10/18/22 0834 10/18/22 0946   10/17/22 1400  ceFEPIme (MAXIPIME) 1 g in sodium chloride 0.9 % 100 mL IVPB  Status:  Discontinued        1 g 200 mL/hr over 30 Minutes Intravenous Every 24 hours 10/17/22 1252 10/17/22 1520   10/14/22 1200  vancomycin (VANCOCIN) IVPB 1000 mg/200 mL premix  Status:  Discontinued        1,000 mg 200 mL/hr over 60 Minutes Intravenous Every M-W-F (Hemodialysis) 10/13/22 1356 10/19/22 0809   10/13/22 1400  ceFEPIme (MAXIPIME) 1 g in sodium chloride 0.9 % 100 mL IVPB  Status:  Discontinued        1 g 200 mL/hr over 30 Minutes Intravenous Every 24 hours 10/13/22 1140 10/17/22 1223   10/13/22 1230  vancomycin (VANCOREADY) IVPB 2000 mg/400 mL        2,000 mg 200 mL/hr over 120 Minutes Intravenous  Once 10/13/22 1140 10/13/22 1437   10/12/22 1645  vancomycin (VANCOREADY) IVPB 1500 mg/300 mL  Status:  Discontinued       See Hyperspace for full Linked Orders Report.   1,500 mg 150 mL/hr over 120 Minutes Intravenous  Once 10/12/22 1110 10/12/22 1753   10/12/22 1600  ceFEPIme (MAXIPIME) 1 g in sodium chloride 0.9 % 100 mL IVPB  Status:  Discontinued        1 g 200 mL/hr over 30 Minutes Intravenous Every 24 hours 10/12/22 1112 10/12/22 1753   10/12/22 1500  vancomycin (VANCOCIN) IVPB 1000 mg/200 mL premix  Status:  Discontinued       See Hyperspace for full Linked Orders Report.   1,000 mg 200 mL/hr over 60 Minutes Intravenous  Once 10/12/22 1110 10/12/22 1753   10/12/22 1115  ceFEPIme (MAXIPIME) 1 g in sodium chloride 0.9 % 100 mL IVPB  Status:  Discontinued        1 g 200 mL/hr over 30 Minutes Intravenous  Once 10/12/22 1110 10/12/22 1112           Data Reviewed:  I have personally reviewed the following...  CBC: Recent Labs  Lab  12/07/22 0416 12/08/22 0513 12/09/22 0457 12/09/22 0804 12/12/22 0917  WBC 5.6 8.0 7.8 7.6 6.4  NEUTROABS 2.2 4.1 4.9  --   --   HGB 8.6* 8.9* 8.7* 8.8* 9.0*  HCT 28.2* 29.3* 28.8* 29.6* 30.0*  MCV 83.4 83.7 83.5 84.6 86.0  PLT 213 207 224 230 231   Basic Metabolic Panel: Recent Labs  Lab 12/07/22 0416 12/09/22 0804 12/12/22 0917  NA 134* 135 137  K 3.7 3.5 3.6  CL 96* 97* 97*  CO2 28 27 28   GLUCOSE 92 112* 126*  BUN 28* 29* 42*  CREATININE 5.95* 6.53* 8.15*  CALCIUM 9.5 10.1 10.5*  PHOS  --  6.3* 5.8*   GFR: Estimated Creatinine Clearance: 7.9 mL/min (A) (by C-G formula based on SCr of 8.15 mg/dL (H)). Liver Function Tests: Recent Labs  Lab 12/09/22 0804 12/12/22 0917  ALBUMIN 2.3* 2.5*   No results for input(s): "LIPASE", "AMYLASE" in the last 168 hours. No results for input(s): "AMMONIA" in the last 168 hours. Coagulation Profile: No results for input(s): "INR", "PROTIME" in the last 168 hours. Cardiac Enzymes: No results for input(s): "CKTOTAL", "CKMB", "CKMBINDEX", "TROPONINI" in the last 168 hours. BNP (last 3 results) No results for input(s): "PROBNP" in the last 8760 hours. HbA1C: No results for input(s): "HGBA1C" in the last 72 hours. CBG: No results for input(s): "GLUCAP" in the last 168 hours. Lipid Profile: No results for input(s): "CHOL", "HDL", "LDLCALC", "TRIG", "CHOLHDL", "LDLDIRECT" in the last 72 hours. Thyroid Function Tests: No results for input(s): "TSH", "T4TOTAL", "FREET4", "T3FREE", "THYROIDAB" in the last 72 hours. Anemia Panel: No results for input(s): "VITAMINB12", "FOLATE", "FERRITIN", "TIBC", "IRON", "RETICCTPCT" in the last 72 hours. Most Recent Urinalysis On File:     Component Value Date/Time   COLORURINE YELLOW (A) 03/20/2022 0125   APPEARANCEUR CLEAR (A) 03/20/2022 0125   APPEARANCEUR Clear 06/14/2014 1510   LABSPEC 1.010 03/20/2022 0125   LABSPEC 1.006 06/14/2014 1510   PHURINE 7.0 03/20/2022 0125   GLUCOSEU 50 (A)  03/20/2022 0125   GLUCOSEU >=500 06/14/2014 1510   HGBUR MODERATE (A) 03/20/2022 0125   BILIRUBINUR NEGATIVE 03/20/2022 0125   BILIRUBINUR Negative 06/14/2014 1510   KETONESUR NEGATIVE 03/20/2022 0125   PROTEINUR 100 (A) 03/20/2022 0125   NITRITE NEGATIVE 03/20/2022 0125   LEUKOCYTESUR NEGATIVE 03/20/2022 0125   LEUKOCYTESUR Negative 06/14/2014 1510  Sepsis Labs: @LABRCNTIP (procalcitonin:4,lacticidven:4) Microbiology: No results found for this or any previous visit (from the past 240 hour(s)).    Radiology Studies last 3 days: No results found.           LOS: 61 days      Sunnie Nielsen, DO [facility] 12/12/2022, 12:23 PM    Dictation software may have been used to generate the above note. Typos may occur and escape review in typed/dictated notes. Please contact Dr Lyn Hollingshead directly for clarity if needed.  Staff may message me via secure chat in Epic  but this may not receive an immediate response,  please page me for urgent matters!  If 7PM-7AM, please contact night coverage [email or web address]

## 2022-12-13 DIAGNOSIS — E1142 Type 2 diabetes mellitus with diabetic polyneuropathy: Secondary | ICD-10-CM | POA: Diagnosis not present

## 2022-12-13 DIAGNOSIS — M5442 Lumbago with sciatica, left side: Secondary | ICD-10-CM | POA: Diagnosis not present

## 2022-12-13 DIAGNOSIS — D649 Anemia, unspecified: Secondary | ICD-10-CM | POA: Diagnosis not present

## 2022-12-13 DIAGNOSIS — M79604 Pain in right leg: Secondary | ICD-10-CM | POA: Diagnosis not present

## 2022-12-13 LAB — CBC
HCT: 30 % — ABNORMAL LOW (ref 36.0–46.0)
Hemoglobin: 8.8 g/dL — ABNORMAL LOW (ref 12.0–15.0)
MCH: 25.1 pg — ABNORMAL LOW (ref 26.0–34.0)
MCHC: 29.3 g/dL — ABNORMAL LOW (ref 30.0–36.0)
MCV: 85.7 fL (ref 80.0–100.0)
Platelets: 232 10*3/uL (ref 150–400)
RBC: 3.5 MIL/uL — ABNORMAL LOW (ref 3.87–5.11)
RDW: 19 % — ABNORMAL HIGH (ref 11.5–15.5)
WBC: 6.9 10*3/uL (ref 4.0–10.5)
nRBC: 0 % (ref 0.0–0.2)

## 2022-12-13 LAB — RENAL FUNCTION PANEL
Albumin: 2.4 g/dL — ABNORMAL LOW (ref 3.5–5.0)
Anion gap: 12 (ref 5–15)
BUN: 47 mg/dL — ABNORMAL HIGH (ref 8–23)
CO2: 28 mmol/L (ref 22–32)
Calcium: 10.5 mg/dL — ABNORMAL HIGH (ref 8.9–10.3)
Chloride: 98 mmol/L (ref 98–111)
Creatinine, Ser: 8.98 mg/dL — ABNORMAL HIGH (ref 0.44–1.00)
GFR, Estimated: 4 mL/min — ABNORMAL LOW (ref >60)
Glucose, Bld: 111 mg/dL — ABNORMAL HIGH (ref 70–99)
Phosphorus: 6.2 mg/dL — ABNORMAL HIGH (ref 2.5–4.6)
Potassium: 3.7 mmol/L (ref 3.5–5.1)
Sodium: 138 mmol/L (ref 135–145)

## 2022-12-13 LAB — HEPATITIS B SURFACE ANTIGEN

## 2022-12-13 MED ORDER — HEPARIN SODIUM (PORCINE) 1000 UNIT/ML IJ SOLN
INTRAMUSCULAR | Status: AC
  Filled 2022-12-13: qty 10

## 2022-12-13 NOTE — Progress Notes (Signed)
[facility]  PROGRESS NOTE   Subjective:   Patient seen and evaluated during dialysis   HEMODIALYSIS FLOWSHEET:  Blood Flow Rate (mL/min): 400 mL/min Arterial Pressure (mmHg): -230 mmHg Venous Pressure (mmHg): 150 mmHg TMP (mmHg): 9 mmHg Ultrafiltration Rate (mL/min): 543 mL/min Dialysate Flow Rate (mL/min): 300 ml/min Dialysis Fluid Bolus: Normal Saline Bolus Amount (mL): 500 mL  Tolerating treatment well    Objective:  Vital signs: Blood pressure 131/69, pulse 76, temperature 97.6 F (36.4 C), temperature source Oral, resp. rate 13, height 6' (1.829 m), weight 87.1 kg, SpO2 99 %.  Intake/Output Summary (Last 24 hours) at 12/13/2022 1123 Last data filed at 12/12/2022 1924 Gross per 24 hour  Intake 240 ml  Output --  Net 240 ml    Filed Weights   12/12/22 0848 12/12/22 1118 12/13/22 0907  Weight: 88.8 kg 88.8 kg 87.1 kg     Physical Exam: General:  No acute distress, laying in bed  Head:  Normocephalic, atraumatic. Moist oral mucosal membranes  Eyes:  Anicteric  Lungs:   Clear to auscultation, normal effort  Heart:  regular  Abdomen:   Soft, nontender, bowel sounds present  Extremities:  No peripheral edema.  Neurologic:  Alert to self only  Skin:  No lesions  Access:  LIJ permcath    Basic Metabolic Panel: Recent Labs  Lab 12/07/22 0416 12/09/22 0804 12/12/22 0917 12/13/22 0926  NA 134* 135 137 138  K 3.7 3.5 3.6 3.7  CL 96* 97* 97* 98  CO2 28 27 28 28   GLUCOSE 92 112* 126* 111*  BUN 28* 29* 42* 47*  CREATININE 5.95* 6.53* 8.15* 8.98*  CALCIUM 9.5 10.1 10.5* 10.5*  PHOS  --  6.3* 5.8* 6.2*    GFR: Estimated Creatinine Clearance: 6.6 mL/min (A) (by C-G formula based on SCr of 8.98 mg/dL (H)).  Liver Function Tests: Recent Labs  Lab 12/09/22 0804 12/12/22 0917 12/13/22 0926  ALBUMIN 2.3* 2.5* 2.4*    No results for input(s): "LIPASE", "AMYLASE" in the last 168 hours. No results for input(s): "AMMONIA" in the last 168  hours.  CBC: Recent Labs  Lab 12/07/22 0416 12/08/22 0513 12/09/22 0457 12/09/22 0804 12/12/22 0917 12/13/22 0926  WBC 5.6 8.0 7.8 7.6 6.4 6.9  NEUTROABS 2.2 4.1 4.9  --   --   --   HGB 8.6* 8.9* 8.7* 8.8* 9.0* 8.8*  HCT 28.2* 29.3* 28.8* 29.6* 30.0* 30.0*  MCV 83.4 83.7 83.5 84.6 86.0 85.7  PLT 213 207 224 230 231 232      HbA1C: Hemoglobin A1C  Date/Time Value Ref Range Status  06/15/2014 12:58 AM 7.9 (H) 4.2 - 6.3 % Final    Comment:    The American Diabetes Association recommends that a primary goal of therapy should be <7% and that physicians should reevaluate the treatment regimen in patients with HbA1c values consistently >8%.   12/24/2013 11:18 AM 8.2 (H) 4.2 - 6.3 % Final    Comment:    The American Diabetes Association recommends that a primary goal of therapy should be <7% and that physicians should reevaluate the treatment regimen in patients with HbA1c values consistently >8%.    Hgb A1c MFr Bld  Date/Time Value Ref Range Status  10/13/2022 11:46 AM 5.2 4.8 - 5.6 % Final    Comment:    (NOTE)         Prediabetes: 5.7 - 6.4         Diabetes: >6.4  Glycemic control for adults with diabetes: <7.0   03/23/2022 06:47 AM 5.8 (H) 4.8 - 5.6 % Final    Comment:    (NOTE) Pre diabetes:          5.7%-6.4%  Diabetes:              >6.4%  Glycemic control for   <7.0% adults with diabetes     Urinalysis: No results for input(s): "COLORURINE", "LABSPEC", "PHURINE", "GLUCOSEU", "HGBUR", "BILIRUBINUR", "KETONESUR", "PROTEINUR", "UROBILINOGEN", "NITRITE", "LEUKOCYTESUR" in the last 72 hours.  Invalid input(s): "APPERANCEUR"    Imaging: No results found.   Medications:    sodium chloride Stopped (11/16/22 1155)   anticoagulant sodium citrate      (feeding supplement) PROSource Plus  30 mL Oral BID BM   cephALEXin  500 mg Oral QHS   Chlorhexidine Gluconate Cloth  6 each Topical Q0600   cinacalcet  30 mg Oral Q supper   epoetin  (EPOGEN/PROCRIT) injection  10,000 Units Intravenous Q M,W,F-HD   feeding supplement (NEPRO CARB STEADY)  237 mL Oral TID BM   fentaNYL  1 patch Transdermal Q72H   gabapentin  100 mg Oral Once per day on Mon Wed Fri   heparin injection (subcutaneous)  5,000 Units Subcutaneous Q8H   levETIRAcetam  1,000 mg Oral Daily   levETIRAcetam  250 mg Oral Q M,W,F   pantoprazole  20 mg Oral BID   psyllium  1 packet Oral Daily   QUEtiapine  25 mg Oral QHS   sertraline  25 mg Oral Daily   sevelamer carbonate  2.4 g Oral TID WC   sodium chloride flush  10-40 mL Intracatheter Q12H   umeclidinium-vilanterol  1 puff Inhalation Daily    Assessment/ Plan:     [name] is a 72 y.o. female with end stage renal disease on hemodialysis, CAD, COPD with intermittent home O2, hypertension, hyperlipidemia, stroke, diabetes who has been admitted since 3/20. Initially admitted for vertebral osteomyelitis. She has completed IV antibiotics. Now is working on placement.   Due to extended hospitalization, patient will need outpatient dialysis clinic re-initiated.   [facility] TTS [facility] LIJ permcath   #1 ESRD:  - Dialysis terminated yesterday due to malfunctioning access. Received Cathflo overnight, access functioning well today. Good BFR - Receiving dialysis today, UF goal 1L as tolerated.  - Next treatment scheduled for Wednesday   #2: Anemia with chronic kidney disease: hemoglobin 8.8 - Continue EPO 10000 units on usual dialysis days.    #3: Second hyperparathyroidism: with hyperphosphatemia and calcium upper level on goal. PTH 157 - lower end of goal.  - Continue cinacalcet  - Continue sevelamer with meals.    #4: Hypertension Not currently on any blood pressure agents. Blood pressure 115/61 during dialysis   LOS: [address] [facility] 5/21/202411:23 AM

## 2022-12-13 NOTE — Progress Notes (Signed)
PROGRESS NOTE    Cynthia Dean   ZOX:[id number] DOB: [birth year]  DOA: 10/12/2022 Date of Service: 12/13/22 PCP: Loistine Chance, MD     Brief Narrative / Hospital Course:  Cynthia Dean is a 72 y.o. 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 She presents to the ED with worsening lower extremity pain. Admitted for possible Discitis/osteomyelitis at L4-L5: w/ suspected intradiscal abscess as per MRI. 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   4/18, HD access / fistula clotted off during dialysis. 4/19 pt taken for procedure to address fistula malfunction, but was agitated and combative, resfused procedure. 4/19 vascular placed R femoral temp HD cath placed, resumed HD and pemcath placed 4/24  Patient has had waxing and waning mental status with intermittently refusing dialysis. Patient's family have decided to continue hemodialysis after speaking with their PCP. Hospital course prolonged, complicated by patient's inability to tolerate sitting in recliner chair for dialysis.  She only agrees to dialysis in the bed.  This is a barrier to discharge as no outpatient dialysis centers can accommodate.      5/3: titrating up on fentanyl patch 12.5 --> 25 mcg, seems to be helping, if still unable to sit up in chair may consider increasing dose further   5/14- back pain seems to be well-controlled.  Caseworkers have discussed the case, will involve ethics committee/Hospital administration. Looking into home dialysis.   5/19 - d/w patient, she states she doesn't want dialysis but cannot give a reason/explanation. I She voices she wants to live and that she needs dialysis to live. Lacks capacity to make informed decision. Continue defer to HCPOA.   5/21 - awaiting home visit to see if candidate for  home dialysis    Consultants:  Neurosurgery Infectious Disease Vascular surgery  Nephrology   Procedures: 09/22/22: Stent placement to the AV graft  10/13/22: CT aspiration of right psoas muscle abscess yielding 2 ml purulent material  11/11/22: R Femoral V catheter placement 11/16/22: Permcath placement to L IJ       ASSESSMENT & PLAN:   Principal Problem:   Back pain Active Problems:   Anemia   Bilateral leg pain   ESRD on dialysis [facility])   Essential hypertension   Dyslipidemia   Seizure disorder (HCC)   Diabetic retinopathy without macular edema associated with type 2 diabetes mellitus (HCC)   GERD (gastroesophageal reflux disease)   Diabetic neuropathy (HCC)   Anemia of chronic disease   Insulin-requiring or dependent type II diabetes mellitus (HCC)   Discitis of lumbar region   Myositis   Psoas abscess (HCC)   Lumbar discitis, 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   Wound cx growing staph capitis.   ID consulted.  Abx de-escalated to cefazolin.   Not a good surgical candidate as per neuro surg.   Pt having severe low back and bilateral leg pain and has not been able to tolerate any position except laying nearly flat in the bed S/p cefazolin, for 6 weeks, End Date: 12/02/22 pain management per orders -- palliative care following peripherally 5/3: titrate up on fentanyl patch 12.5 --> 25 mcg - pt reports pain is improved, but still unable to tolerate sitting in chair for  dialysis    Confusion and agitation - improved Hospital delirium  Pt was agitated, and also not responding to questions or commands during early hospitalization.  Mental status did start to improve, and now pt is calm and interactive, however, at this time she has been having waxing and waning mental status Delirium precautions cont  seroquel 50 mg nightly to help with delirium and sleep.   ESRD: on HD MWF.  Pt refused HD on 10/17/22, 10/18/22 and 3/27, 4/1.   When attempts made to do HD in chair, she did not tolerate on account of severe back pain Recommend consideration for home hemodialysis if no facility capable to do HD in the bed Pt has voiced on occasion that she does not want dialysis and has been intermittently refusing this. Family encourages her to complete HD.  Ethics consult placed to discuss appropriate decision-making but in my judgment patient lacks capacity to give informed consent/refusal and the appropriate surrogate decision maker is involved (HCPOA is daughter) so while the situation is frustrating we do not have strong evidence that the patient, while lucid, was refusing dialysis consistently.    Generalized weakness / Physical Debility:  PT recs SNF. TOC following. Pt is bedridden and very high risk for developing pressure injuries of skin -- reposition frequently.   Monitor pressures areas on skin and offload as possible.   Severe back and bilateral leg pain due to severe multi-level lumbar degenerative disc disease. Pt has bulging discs at L2-3, L3-4, L4-5 and L5-S1. Pt will continue to have back and leg pain once antibiotics are complete. Pain mgmt per orders  Pain control complicated by AMS and hypotension with escalation of opiate dose - titrated up on fentanyl patch slowly and 25 mcg seems to be helpful, cautious use of IV fentanyl for breakthrough pain    ACD:  likely secondary ESRD. H&H are stable  Monitor periodic CBC   Hypokalemia Replace as needed Monitor BMP   Peripheral neuropathy --  Continue low dose gabapentin and monitor   Hx of seizures:  continue on home dose of Keppra   HLD:  continue on statin    Hx of COPD: w/o exacerbation.  Continue on bronchodilators    DM2: well controlled HbA1c 5.2.  d/c'ed BG checks    HTN:  Antihypertensives as blood pressure allows    GERD:  continue on PPI    Depression: severity unknown.  Continue on home dose of sertraline     Overweight: BMI 28.2.  Wt loss efforts recommended   Right foot pain  see peripheral neuropathy   DVT prophylaxis: heparin sq Pertinent IV fluids/nutrition: no continuous IV fluids  Central lines / invasive devices: L IJ HD cath, port R chest   Code Status: DNR ACP documentation reviewed: 05/01 and 05/16- MOST and advanced directive reviewed, [name] is HCPOA  Current Admission Status: inpatient  TOC needs / Dispo plan: SNF Barriers to discharge / significant pending items: unable to sit to toelrate outpatient HD, working w/ PT, TOC involved             Subjective / Brief ROS:  No concerns, pt resting comfortably in dialysis suite, alert, reports minimal back pain at the moment    Family Communication: none at this time     Objective Findings:  Vitals:   12/13/22 1315 12/13/22 1330 12/13/22 1345 12/13/22 1400  BP: (!) 146/70 135/67 (!) 148/82 139/68  Pulse:  80  82  Resp: 17 20 (!) 23 20  Temp:    97.7 F (36.5 C)  TempSrc:    Oral  SpO2:  (!) 82%  98%  Weight:      Height:        Intake/Output Summary (Last 24 hours) at 12/13/2022 1410 Last data filed at 12/13/2022 1400 Gross per 24 hour  Intake 240 ml  Output 1000 ml  Net -760 ml    Filed Weights   12/12/22 0848 12/12/22 1118 12/13/22 0907  Weight: 88.8 kg 88.8 kg 87.1 kg    Examination:  Physical Exam Constitutional:      General: She is not in acute distress.    Appearance: Normal appearance. She is obese.  Cardiovascular:     Rate and Rhythm: Normal rate and regular rhythm.  Pulmonary:     Effort: Pulmonary effort is normal.     Breath sounds: Normal breath sounds.  Abdominal:     General: Abdomen is flat.     Palpations: Abdomen is soft.  Neurological:     Mental Status: She is alert. Mental status is at baseline.  Psychiatric:        Behavior: Behavior normal.           Scheduled Medications:   (feeding supplement) PROSource Plus  30 mL Oral BID BM   cephALEXin  500 mg Oral QHS   Chlorhexidine Gluconate Cloth  6 each Topical Q0600   cinacalcet  30 mg Oral Q supper   epoetin (EPOGEN/PROCRIT) injection  10,000 Units Intravenous Q M,W,F-HD   feeding supplement (NEPRO CARB STEADY)  237 mL Oral TID BM   fentaNYL  1 patch Transdermal Q72H   gabapentin  100 mg Oral Once per day on Mon Wed Fri   heparin injection (subcutaneous)  5,000 Units Subcutaneous Q8H   levETIRAcetam  1,000 mg Oral Daily   levETIRAcetam  250 mg Oral Q M,W,F   pantoprazole  20 mg Oral BID   psyllium  1 packet Oral Daily   QUEtiapine  25 mg Oral QHS   sertraline  25 mg Oral Daily   sevelamer carbonate  2.4 g Oral TID WC   sodium chloride flush  10-40 mL Intracatheter Q12H   umeclidinium-vilanterol  1 puff Inhalation Daily    Continuous Infusions:  sodium chloride Stopped (11/16/22 1155)   anticoagulant sodium citrate      PRN Medications:  anticoagulant sodium citrate, fentaNYL (SUBLIMAZE) injection, guaiFENesin, hydrALAZINE, lidocaine (PF), lidocaine-prilocaine, LORazepam, ondansetron, phenol, senna-docusate, sodium chloride flush  Antimicrobials from admission:  Anti-infectives (From admission, onward)    Start     Dose/Rate Route Frequency Ordered Stop   12/05/22 2200  cephALEXin (KEFLEX) capsule 500 mg        500 mg Oral Daily at bedtime 12/05/22 1530 01/02/23 2159   11/18/22 1200  ceFAZolin (ANCEF) IVPB 3g/100 mL premix        3 g 200 mL/hr over 30 Minutes Intravenous Every Fri (Hemodialysis) 11/13/22 1039 12/02/22 1224   11/17/22 0000  ceFAZolin (ANCEF) IVPB 1 g/50 mL premix       Note to Pharmacy: Send with pt to OR   1 g 100 mL/hr over 30 Minutes Intravenous On call 11/16/22 1154 11/16/22 1500   11/16/22 1200  ceFAZolin (ANCEF) IVPB 2g/100 mL premix        2 g 200 mL/hr over 30 Minutes Intravenous Every Wed (Hemodialysis) 11/13/22 1039 11/30/22 1723    11/14/22 1200  ceFAZolin (ANCEF) IVPB 2g/100 mL premix        2 g  200 mL/hr over 30 Minutes Intravenous Every Mon (Hemodialysis) 11/13/22 1039 11/28/22 1151   11/11/22 2200  ceFAZolin (ANCEF) IVPB 1 g/50 mL premix        1 g 100 mL/hr over 30 Minutes Intravenous Daily at 10 pm 11/10/22 1655 11/13/22 2320   11/09/22 1200  ceFAZolin (ANCEF) IVPB 2g/100 mL premix  Status:  Discontinued        2 g 200 mL/hr over 30 Minutes Intravenous Every Wed (Hemodialysis) 11/02/22 1246 11/03/22 1111   11/07/22 1700  ceFAZolin (ANCEF) IVPB 2g/100 mL premix        2 g 200 mL/hr over 30 Minutes Intravenous  Once 11/07/22 1610 11/07/22 1827   11/07/22 1200  ceFAZolin (ANCEF) IVPB 2g/100 mL premix  Status:  Discontinued        2 g 200 mL/hr over 30 Minutes Intravenous Every Mon (Hemodialysis) 11/02/22 1246 11/10/22 1655   11/04/22 1200  ceFAZolin (ANCEF) IVPB 3g/100 mL premix  Status:  Discontinued        3 g 200 mL/hr over 30 Minutes Intravenous Every Fri (Hemodialysis) 11/02/22 1246 11/10/22 1655   11/02/22 2200  ceFAZolin (ANCEF) IVPB 1 g/50 mL premix  Status:  Discontinued        1 g 100 mL/hr over 30 Minutes Intravenous Daily at bedtime 11/01/22 1452 11/02/22 1246   11/02/22 1330  ceFAZolin (ANCEF) IVPB 2g/100 mL premix  Status:  Discontinued        2 g 200 mL/hr over 30 Minutes Intravenous Every Wed (Hemodialysis) 11/02/22 1320 11/10/22 1655   11/02/22 1200  ceFAZolin (ANCEF) IVPB 2g/100 mL premix  Status:  Discontinued        2 g 200 mL/hr over 30 Minutes Intravenous Every Wed (Hemodialysis) 10/28/22 1004 11/01/22 1452   10/31/22 1200  ceFAZolin (ANCEF) IVPB 2g/100 mL premix  Status:  Discontinued        2 g 200 mL/hr over 30 Minutes Intravenous Every Mon (Hemodialysis) 10/28/22 1004 11/01/22 1452   10/28/22 1200  ceFAZolin (ANCEF) IVPB 3g/100 mL premix  Status:  Discontinued        3 g 200 mL/hr over 30 Minutes Intravenous Every Fri (Hemodialysis) 10/28/22 1004 11/01/22 1452   10/19/22 2200   ceFAZolin (ANCEF) IVPB 1 g/50 mL premix  Status:  Discontinued        1 g 100 mL/hr over 30 Minutes Intravenous Every 24 hours 10/18/22 0834 10/28/22 1004   10/18/22 1000  ceFAZolin (ANCEF) IVPB 2g/100 mL premix        2 g 200 mL/hr over 30 Minutes Intravenous  Once 10/18/22 0834 10/18/22 0946   10/17/22 1400  ceFEPIme (MAXIPIME) 1 g in sodium chloride 0.9 % 100 mL IVPB  Status:  Discontinued        1 g 200 mL/hr over 30 Minutes Intravenous Every 24 hours 10/17/22 1252 10/17/22 1520   10/14/22 1200  vancomycin (VANCOCIN) IVPB 1000 mg/200 mL premix  Status:  Discontinued        1,000 mg 200 mL/hr over 60 Minutes Intravenous Every M-W-F (Hemodialysis) 10/13/22 1356 10/19/22 0809   10/13/22 1400  ceFEPIme (MAXIPIME) 1 g in sodium chloride 0.9 % 100 mL IVPB  Status:  Discontinued        1 g 200 mL/hr over 30 Minutes Intravenous Every 24 hours 10/13/22 1140 10/17/22 1223   10/13/22 1230  vancomycin (VANCOREADY) IVPB 2000 mg/400 mL        2,000 mg 200 mL/hr over 120 Minutes Intravenous  Once  10/13/22 1140 10/13/22 1437   10/12/22 1645  vancomycin (VANCOREADY) IVPB 1500 mg/300 mL  Status:  Discontinued       See Hyperspace for full Linked Orders Report.   1,500 mg 150 mL/hr over 120 Minutes Intravenous  Once 10/12/22 1110 10/12/22 1753   10/12/22 1600  ceFEPIme (MAXIPIME) 1 g in sodium chloride 0.9 % 100 mL IVPB  Status:  Discontinued        1 g 200 mL/hr over 30 Minutes Intravenous Every 24 hours 10/12/22 1112 10/12/22 1753   10/12/22 1500  vancomycin (VANCOCIN) IVPB 1000 mg/200 mL premix  Status:  Discontinued       See Hyperspace for full Linked Orders Report.   1,000 mg 200 mL/hr over 60 Minutes Intravenous  Once 10/12/22 1110 10/12/22 1753   10/12/22 1115  ceFEPIme (MAXIPIME) 1 g in sodium chloride 0.9 % 100 mL IVPB  Status:  Discontinued        1 g 200 mL/hr over 30 Minutes Intravenous  Once 10/12/22 1110 10/12/22 1112           Data Reviewed:  I have personally reviewed the  following...  CBC: Recent Labs  Lab 12/07/22 0416 12/08/22 0513 12/09/22 0457 12/09/22 0804 12/12/22 0917 12/13/22 0926  WBC 5.6 8.0 7.8 7.6 6.4 6.9  NEUTROABS 2.2 4.1 4.9  --   --   --   HGB 8.6* 8.9* 8.7* 8.8* 9.0* 8.8*  HCT 28.2* 29.3* 28.8* 29.6* 30.0* 30.0*  MCV 83.4 83.7 83.5 84.6 86.0 85.7  PLT 213 207 224 230 231 232    Basic Metabolic Panel: Recent Labs  Lab 12/07/22 0416 12/09/22 0804 12/12/22 0917 12/13/22 0926  NA 134* 135 137 138  K 3.7 3.5 3.6 3.7  CL 96* 97* 97* 98  CO2 28 27 28 28   GLUCOSE 92 112* 126* 111*  BUN 28* 29* 42* 47*  CREATININE 5.95* 6.53* 8.15* 8.98*  CALCIUM 9.5 10.1 10.5* 10.5*  PHOS  --  6.3* 5.8* 6.2*    GFR: Estimated Creatinine Clearance: 6.6 mL/min (A) (by C-G formula based on SCr of 8.98 mg/dL (H)). Liver Function Tests: Recent Labs  Lab 12/09/22 0804 12/12/22 0917 12/13/22 0926  ALBUMIN 2.3* 2.5* 2.4*    No results for input(s): "LIPASE", "AMYLASE" in the last 168 hours. No results for input(s): "AMMONIA" in the last 168 hours. Coagulation Profile: No results for input(s): "INR", "PROTIME" in the last 168 hours. Cardiac Enzymes: No results for input(s): "CKTOTAL", "CKMB", "CKMBINDEX", "TROPONINI" in the last 168 hours. BNP (last 3 results) No results for input(s): "PROBNP" in the last 8760 hours. HbA1C: No results for input(s): "HGBA1C" in the last 72 hours. CBG: No results for input(s): "GLUCAP" in the last 168 hours. Lipid Profile: No results for input(s): "CHOL", "HDL", "LDLCALC", "TRIG", "CHOLHDL", "LDLDIRECT" in the last 72 hours. Thyroid Function Tests: No results for input(s): "TSH", "T4TOTAL", "FREET4", "T3FREE", "THYROIDAB" in the last 72 hours. Anemia Panel: No results for input(s): "VITAMINB12", "FOLATE", "FERRITIN", "TIBC", "IRON", "RETICCTPCT" in the last 72 hours. Most Recent Urinalysis On File:     Component Value Date/Time   COLORURINE YELLOW (A) 03/20/2022 0125   APPEARANCEUR CLEAR (A)  03/20/2022 0125   APPEARANCEUR Clear 06/14/2014 1510   LABSPEC 1.010 03/20/2022 0125   LABSPEC 1.006 06/14/2014 1510   PHURINE 7.0 03/20/2022 0125   GLUCOSEU 50 (A) 03/20/2022 0125   GLUCOSEU >=500 06/14/2014 1510   HGBUR MODERATE (A) 03/20/2022 0125   BILIRUBINUR NEGATIVE 03/20/2022 0125  BILIRUBINUR Negative 06/14/2014 1510   KETONESUR NEGATIVE 03/20/2022 0125   PROTEINUR 100 (A) 03/20/2022 0125   NITRITE NEGATIVE 03/20/2022 0125   LEUKOCYTESUR NEGATIVE 03/20/2022 0125   LEUKOCYTESUR Negative 06/14/2014 1510   Sepsis Labs: @LABRCNTIP (procalcitonin:4,lacticidven:4) Microbiology: No results found for this or any previous visit (from the past 240 hour(s)).    Radiology Studies last 3 days: No results found.           LOS: 62 days      Sunnie Nielsen, DO [facility] 12/13/2022, 2:10 PM    Dictation software may have been used to generate the above note. Typos may occur and escape review in typed/dictated notes. Please contact Dr Lyn Hollingshead directly for clarity if needed.  Staff may message me via secure chat in Epic  but this may not receive an immediate response,  please page me for urgent matters!  If 7PM-7AM, please contact night coverage [email or web address]

## 2022-12-13 NOTE — Progress Notes (Signed)
Hemodialysis Note  Received patient in bed to unit. Alert and oriented. Informed consent signed and in chart.   Treatment initiated:1004 Treatment completed:1348  Patient tolerated treatment well. Transported back to the room alert, without acute distress. Report given to patient's RN.  Access used:LIJ Catheter  Access issues: Catheter previously had not functioned well, overnight dwell of Cath Flo. Functioned well this session.  Total UF removed:1 liter/1000 ml Medications given: Heparin for CVC  Post HD VS: Stable  Post HD weight:73.5 kg  bed weight , non standing   Bartolo Darter, RN  [facility]

## 2022-12-14 DIAGNOSIS — M4646 Discitis, unspecified, lumbar region: Secondary | ICD-10-CM | POA: Diagnosis not present

## 2022-12-14 DIAGNOSIS — E440 Moderate protein-calorie malnutrition: Secondary | ICD-10-CM | POA: Insufficient documentation

## 2022-12-14 LAB — RENAL FUNCTION PANEL
Albumin: 2.6 g/dL — ABNORMAL LOW (ref 3.5–5.0)
Anion gap: 9 (ref 5–15)
BUN: 25 mg/dL — ABNORMAL HIGH (ref 8–23)
CO2: 26 mmol/L (ref 22–32)
Calcium: 10.4 mg/dL — ABNORMAL HIGH (ref 8.9–10.3)
Chloride: 102 mmol/L (ref 98–111)
Creatinine, Ser: 6.08 mg/dL — ABNORMAL HIGH (ref 0.44–1.00)
GFR, Estimated: 7 mL/min — ABNORMAL LOW (ref >60)
Glucose, Bld: 138 mg/dL — ABNORMAL HIGH (ref 70–99)
Phosphorus: 4.5 mg/dL (ref 2.5–4.6)
Potassium: 3.9 mmol/L (ref 3.5–5.1)
Sodium: 137 mmol/L (ref 135–145)

## 2022-12-14 LAB — CBC
HCT: 30.9 % — ABNORMAL LOW (ref 36.0–46.0)
Hemoglobin: 9 g/dL — ABNORMAL LOW (ref 12.0–15.0)
MCH: 25.1 pg — ABNORMAL LOW (ref 26.0–34.0)
MCHC: 29.1 g/dL — ABNORMAL LOW (ref 30.0–36.0)
MCV: 86.1 fL (ref 80.0–100.0)
Platelets: 210 10*3/uL (ref 150–400)
RBC: 3.59 MIL/uL — ABNORMAL LOW (ref 3.87–5.11)
RDW: 18.7 % — ABNORMAL HIGH (ref 11.5–15.5)
WBC: 7.1 10*3/uL (ref 4.0–10.5)
nRBC: 0 % (ref 0.0–0.2)

## 2022-12-14 MED ORDER — RENA-VITE PO TABS
1.0000 | ORAL_TABLET | Freq: Every day | ORAL | Status: DC
  Administered 2022-12-14 – 2023-01-04 (×22): 1 via ORAL
  Filled 2022-12-14 (×22): qty 1

## 2022-12-14 MED ORDER — EPOETIN ALFA 10000 UNIT/ML IJ SOLN
INTRAMUSCULAR | Status: AC
  Filled 2022-12-14: qty 1

## 2022-12-14 NOTE — Progress Notes (Signed)
[facility]  PROGRESS NOTE   Subjective:   Patient seen resting in bed Sleep on arrival, aroused easily No family at bedside Agreeable to receive dialysis today   Objective:  Vital signs: Blood pressure 116/61, pulse 87, temperature 98.5 F (36.9 C), temperature source Oral, resp. rate 16, height 6' (1.829 m), weight 89.2 kg, SpO2 93 %.  Intake/Output Summary (Last 24 hours) at 12/14/2022 1404 Last data filed at 12/13/2022 1913 Gross per 24 hour  Intake 480 ml  Output --  Net 480 ml    Filed Weights   12/13/22 0907 12/13/22 1421 12/14/22 1338  Weight: 87.1 kg 85.6 kg 89.2 kg     Physical Exam: General:  No acute distress, laying in bed  Head:  Normocephalic, atraumatic. Moist oral mucosal membranes  Eyes:  Anicteric  Lungs:   Clear to auscultation, normal effort  Heart:  Irregular  Abdomen:   Soft, nontender, bowel sounds present  Extremities:  No peripheral edema.  Neurologic:  Alert and oriented to self and place  Skin:  No lesions  Access:  LIJ permcath    Basic Metabolic Panel: Recent Labs  Lab 12/09/22 0804 12/12/22 0917 12/13/22 0926 12/14/22 1337  NA 135 137 138 137  K 3.5 3.6 3.7 3.9  CL 97* 97* 98 102  CO2 27 28 28 26   GLUCOSE 112* 126* 111* 138*  BUN 29* 42* 47* 25*  CREATININE 6.53* 8.15* 8.98* 6.08*  CALCIUM 10.1 10.5* 10.5* 10.4*  PHOS 6.3* 5.8* 6.2* 4.5    GFR: Estimated Creatinine Clearance: 10.7 mL/min (A) (by C-G formula based on SCr of 6.08 mg/dL (H)).  Liver Function Tests: Recent Labs  Lab 12/09/22 0804 12/12/22 0917 12/13/22 0926 12/14/22 1337  ALBUMIN 2.3* 2.5* 2.4* 2.6*    No results for input(s): "LIPASE", "AMYLASE" in the last 168 hours. No results for input(s): "AMMONIA" in the last 168 hours.  CBC: Recent Labs  Lab 12/08/22 0513 12/09/22 0457 12/09/22 0804 12/12/22 0917 12/13/22 0926 12/14/22 1337  WBC 8.0 7.8 7.6 6.4 6.9 7.1  NEUTROABS 4.1 4.9  --   --   --   --   HGB 8.9* 8.7* 8.8* 9.0*  8.8* 9.0*  HCT 29.3* 28.8* 29.6* 30.0* 30.0* 30.9*  MCV 83.7 83.5 84.6 86.0 85.7 86.1  PLT 207 224 230 231 232 210      HbA1C: Hemoglobin A1C  Date/Time Value Ref Range Status  06/15/2014 12:58 AM 7.9 (H) 4.2 - 6.3 % Final    Comment:    The American Diabetes Association recommends that a primary goal of therapy should be <7% and that physicians should reevaluate the treatment regimen in patients with HbA1c values consistently >8%.   12/24/2013 11:18 AM 8.2 (H) 4.2 - 6.3 % Final    Comment:    The American Diabetes Association recommends that a primary goal of therapy should be <7% and that physicians should reevaluate the treatment regimen in patients with HbA1c values consistently >8%.    Hgb A1c MFr Bld  Date/Time Value Ref Range Status  10/13/2022 11:46 AM 5.2 4.8 - 5.6 % Final    Comment:    (NOTE)         Prediabetes: 5.7 - 6.4         Diabetes: >6.4         Glycemic control for adults with diabetes: <7.0   03/23/2022 06:47 AM 5.8 (H) 4.8 - 5.6 % Final    Comment:    (NOTE) Pre diabetes:  5.7%-6.4%  Diabetes:              >6.4%  Glycemic control for   <7.0% adults with diabetes     Urinalysis: No results for input(s): "COLORURINE", "LABSPEC", "PHURINE", "GLUCOSEU", "HGBUR", "BILIRUBINUR", "KETONESUR", "PROTEINUR", "UROBILINOGEN", "NITRITE", "LEUKOCYTESUR" in the last 72 hours.  Invalid input(s): "APPERANCEUR"    Imaging: No results found.   Medications:    sodium chloride Stopped (11/16/22 1155)   anticoagulant sodium citrate      (feeding supplement) PROSource Plus  30 mL Oral BID BM   cephALEXin  500 mg Oral QHS   Chlorhexidine Gluconate Cloth  6 each Topical Q0600   cinacalcet  30 mg Oral Q supper   epoetin (EPOGEN/PROCRIT) injection  10,000 Units Intravenous Q M,W,F-HD   feeding supplement (NEPRO CARB STEADY)  237 mL Oral TID BM   fentaNYL  1 patch Transdermal Q72H   gabapentin  100 mg Oral Once per day on Mon Wed Fri   heparin  injection (subcutaneous)  5,000 Units Subcutaneous Q8H   levETIRAcetam  1,000 mg Oral Daily   levETIRAcetam  250 mg Oral Q M,W,F   multivitamin  1 tablet Oral QHS   pantoprazole  20 mg Oral BID   psyllium  1 packet Oral Daily   QUEtiapine  25 mg Oral QHS   sertraline  25 mg Oral Daily   sevelamer carbonate  2.4 g Oral TID WC   sodium chloride flush  10-40 mL Intracatheter Q12H   umeclidinium-vilanterol  1 puff Inhalation Daily    Assessment/ Plan:     Ms. Haeli Voigt is a 72 y.o. female with end stage renal disease on hemodialysis, CAD, COPD with intermittent home O2, hypertension, hyperlipidemia, stroke, diabetes who has been admitted since 3/20. Initially admitted for vertebral osteomyelitis. She has completed IV antibiotics. Now is working on placement.   Due to extended hospitalization, patient will need outpatient dialysis clinic re-initiated.   [facility] TTS [facility] permcath   #1 ESRD:  -Dialysis scheduled for later today, no UF due to poor appetite.  - Next treatment scheduled for Friday   #2: Anemia with chronic kidney disease: hemoglobin 9.0 - Continue EPO 10000 units with dialysis.    #3: Second hyperparathyroidism: with hyperphosphatemia and calcium upper level on goal. PTH 157 - lower end of goal.  - Calcium and phosphorus within desired range - Continue cinacalcet  - Continue sevelamer with meals.    #4: Hypertension Not currently on any blood pressure agents. Blood pressure soft but stable for this patient.    LOS: [id number] [facility] 5/22/20242:04 PM

## 2022-12-14 NOTE — Progress Notes (Signed)
  Received patient in bed to unit.   Informed consent signed and in chart.    TX duration:3.5hrs     Transported back to floor  Hand-off given to patient's nurse. No c/o and no distress noted    Access used: L HD catheter  Access issues: none   Total UF removed: 0 Medication(s) given: 10,000u epogen Post HD VS: 140/76 Post HD weight: 89.2kg     Lynann Beaver  [facility]

## 2022-12-14 NOTE — Progress Notes (Signed)
Progress Note   [name] WUJ:[id number] DOB: 01-07-1951 DOA: 10/12/2022     63 DOS: the patient was seen and examined on 12/14/2022     Subjective:  Patient seen and examined at bedside this morning Psychiatry has been consulted for capacity evaluation Case discussed with case with case We will continue to follow closely on discharge planning   Brief Narrative / Hospital Course:  Cynthia Dean is a 72 y.o. 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 She presents to the ED with worsening lower extremity pain. Admitted for possible Discitis/osteomyelitis at L4-L5: w/ suspected intradiscal abscess as per MRI. 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    4/18, HD access / fistula clotted off during dialysis. 4/19 pt taken for procedure to address fistula malfunction, but was agitated and combative, resfused procedure. 4/19 vascular placed R femoral temp HD cath placed, resumed HD and pemcath placed 4/24   Patient has had waxing and waning mental status with intermittently refusing dialysis. Patient's family have decided to continue hemodialysis after speaking with their PCP. Hospital course prolonged, complicated by patient's inability to tolerate sitting in recliner chair for dialysis.  She only agrees to dialysis in the bed.  This is a barrier to discharge as no outpatient dialysis centers can accommodate.       5/3: titrating up on fentanyl patch 12.5 --> 25 mcg, seems to be helping, if still unable to sit up in chair may consider increasing dose further    5/14- back pain seems to be well-controlled.  Caseworkers have discussed the case, will involve ethics committee/Hospital administration. Looking into home dialysis.    5/19 - d/w patient, she states she doesn't want dialysis  but cannot give a reason/explanation. I She voices she wants to live and that she needs dialysis to live. Lacks capacity to make informed decision. Continue defer to HCPOA.    5/21 - awaiting home visit to see if candidate for home dialysis      Consultants:  Neurosurgery Infectious Disease Vascular surgery  Nephrology    Procedures: 09/22/22: Stent placement to the AV graft  10/13/22: CT aspiration of right psoas muscle abscess yielding 2 ml purulent material  11/11/22: R Femoral V catheter placement 11/16/22: Permcath placement to L IJ            ASSESSMENT & PLAN:   Principal Problem:   Back pain Active Problems:   Anemia   Bilateral leg pain   ESRD on dialysis [facility])   Essential hypertension   Dyslipidemia   Seizure disorder (HCC)   Diabetic retinopathy without macular edema associated with type 2 diabetes mellitus (HCC)   GERD (gastroesophageal reflux disease)   Diabetic neuropathy (HCC)   Anemia of chronic disease   Insulin-requiring or dependent type II diabetes mellitus (HCC)   Discitis of lumbar region   Myositis   Psoas abscess (HCC)     Lumbar discitis, 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   Wound cx growing staph capitis.   ID consulted.  Abx de-escalated to cefazolin.   Not a good surgical candidate as per neuro surg.   Pt having severe low back and bilateral leg pain and has not been able to  tolerate any position except laying nearly flat in the bed S/p cefazolin, for 6 weeks, End Date: 12/02/22 pain management per orders -- palliative care following peripherally 5/3: titrate up on fentanyl patch 12.5 --> 25 mcg - pt reports pain is improved, but still unable to tolerate sitting in chair for dialysis    Confusion and agitation - improved Hospital delirium  Pt was agitated, and also not  responding to questions or commands during early hospitalization.  Mental status did start to improve, and now pt is calm and interactive, however, at this time she has been having waxing and waning mental status Delirium precautions cont seroquel 50 mg nightly to help with delirium and sleep.   ESRD: on HD MWF.  Pt refused HD on 10/17/22, 10/18/22 and 3/27, 4/1.   When attempts made to do HD in chair, she did not tolerate on account of severe back pain Recommend consideration for home hemodialysis if no facility capable to do HD in the bed Pt has voiced on occasion that she does not want dialysis and has been intermittently refusing this. Family encourages her to complete HD.  I have consulted psychiatrist for capacity evaluation   Generalized weakness / Physical Debility:  PT recs SNF. TOC following. Pt is bedridden and very high risk for developing pressure injuries of skin -- reposition frequently.   Monitor pressures areas on skin and offload as possible.   Severe back and bilateral leg pain due to severe multi-level lumbar degenerative disc disease. Pt has bulging discs at L2-3, L3-4, L4-5 and L5-S1. Pt will continue to have back and leg pain once antibiotics are complete. Pain mgmt per orders  Pain control complicated by AMS and hypotension with escalation of opiate dose - titrated up on fentanyl patch slowly and 25 mcg seems to be helpful, cautious use of IV fentanyl for breakthrough pain    ACD:  likely secondary ESRD. H&H are stable  Monitor periodic CBC   Hypokalemia Replace as needed Monitor BMP   Peripheral neuropathy --  Continue low dose gabapentin and monitor   Hx of seizures:  continue on home dose of Keppra   HLD:  continue on statin    Hx of COPD: w/o exacerbation.  Continue on bronchodilators    DM2: well controlled HbA1c 5.2.  d/c'ed BG checks    HTN:  Antihypertensives as blood pressure allows   GERD:  continue on PPI    Depression: severity  unknown.  Continue on home dose of sertraline     Overweight: BMI 28.2.  Wt loss efforts recommended   Right foot pain  see peripheral neuropathy     DVT prophylaxis: heparin sq  Central lines / invasive devices: L IJ HD cath, port R chest    Code Status: DNR  Current Admission Status: inpatient   TOC needs / Dispo plan: SNF  Barriers to discharge / significant pending items: unable to sit to toelrate outpatient HD, working w/ PT, TOC involved    Family Communication: none at this time       Physical Exam Constitutional:      General: She is not in acute distress.    Appearance: Normal appearance. She is obese.  Cardiovascular:     Rate and Rhythm: Normal rate and regular rhythm.  Pulmonary:     Effort: Pulmonary effort is normal.     Breath sounds: Normal breath sounds.  Abdominal:     General: Abdomen is flat.     Palpations: Abdomen is soft.  Neurological:     Mental Status: She is alert. Mental status is at baseline.  Psychiatric:        Behavior: Behavior normal.      Data Reviewed:  Reviewed patient's lab most recent laboratory results  that showed sodium 138 potassium 3.7 creatinine 8.98  Time spent: 40 minutes   Vitals:   12/14/22 0756 12/14/22 1322 12/14/22 1337 12/14/22 1338  BP: 118/65 (!) 124/58 116/61   Pulse: 87 92 87   Resp: 17 20 16    Temp: 98.3 F (36.8 C) 98.5 F (36.9 C)    TempSrc:  Oral    SpO2: 92% 94% 93%   Weight:    89.2 kg  Height:         Author: Loyce Dys, MD 12/14/2022 1:53 PM  For on call review [email or web address].

## 2022-12-14 NOTE — Progress Notes (Signed)
Nutrition Follow-up  DOCUMENTATION CODES:   Non-severe (moderate) malnutrition in context of chronic illness  INTERVENTION:   -Continue renal MVI daily -Continue 30 ml Prosource Plus BID, each supplement provides 100 kcals and 15 grams protein -Continue Nepro Shake po TID, each supplement provides 425 kcal and 19 grams protein  -Continue 2 gram sodium diet for wider variety of meal selections -Feeding assistance with meals  NUTRITION DIAGNOSIS:   Moderate Malnutrition related to chronic illness (ESRD on HD) as evidenced by percent weight loss, mild muscle depletion, moderate muscle depletion, edema.  Ongoing  GOAL:   Patient will meet greater than or equal to 90% of their needs  Progressing   MONITOR:   PO intake, Supplement acceptance  REASON FOR ASSESSMENT:   Malnutrition Screening Tool    ASSESSMENT:   Pt with PMH of ESRD on HD, HLD, COPD, DM2, HTN, depression, CVA who presents with back pain x 3 weeks PTA.  4/19- triple lumen HD cath placed secondary to clotted AVG  4/24- tunneled HD cath placed via lt internal jugular vein 5/17- pt left HD treatment early 5/20- HD terminated early due to catheter malfunction  Reviewed I/O's: -520 ml x 24 hours and -3.1 L since 11/30/22   Pt tolerated last HD treatment well. Noted that pt ended last few HD sessions early. There was been a history of pt refusing HD throughout admission.   Pt lying in bed at time of visit. She awakened to voice. Pt smiled at this RD when greeted, but unable to provide a history, often repeated back whatever RD said to her. Noted pt did not drink any Nepro this morning and breakfast tray was untouched. Meal completions have been variable; PO 0-100%.   Ethics has been consulted; recommending psychiatric evaluation to determine if pt has capacity to make medical decisions.   Per MD notes, awaiting home visit to determine if home HD is feasible. Pt's back pain and leg pain is limiting her to sit in a  chair for HD. LTACH is not an option for pt as she is unable to sit in a chair for HD.   Reviewed wt hx; pt has experienced a 5.2% wt loss over the past month, which is significant for time frame. Noted -3.1 L since 11/30/22. Suspect some wt loss may be related to fluid changes from HD, but exam has also worsened since admission.   Medications reviewed and include keppra, metamucil, renvela, sensipar, and epogen.   Labs reviewed: Phos: 6.2, CBGS: 131.    NUTRITION - FOCUSED PHYSICAL EXAM:  Flowsheet Row Most Recent Value  Orbital Region No depletion  Upper Arm Region Mild depletion  Thoracic and Lumbar Region No depletion  Buccal Region No depletion  Temple Region No depletion  Clavicle Bone Region Mild depletion  Clavicle and Acromion Bone Region Mild depletion  Scapular Bone Region Mild depletion  Dorsal Hand Moderate depletion  Patellar Region Moderate depletion  Anterior Thigh Region Moderate depletion  Posterior Calf Region Moderate depletion  Edema (RD Assessment) Mild  Hair Reviewed  Eyes Reviewed  Mouth Reviewed  Skin Reviewed  Nails Reviewed       Diet Order:   Diet Order             Diet 2 gram sodium Fluid consistency: Thin  Diet effective now                   EDUCATION NEEDS:   No education needs have been identified at this time  Skin:  Skin Assessment: Skin Integrity Issues: Skin Integrity Issues:: Other (Comment) Other: IAD to lt buttocks  Last BM:  12/13/22 (type 6)  Height:   Ht Readings from Last 1 Encounters:  10/12/22 6' (1.829 m)    Weight:   Wt Readings from Last 1 Encounters:  12/13/22 85.6 kg    Ideal Body Weight:  72.7 kg  BMI:  Body mass index is 25.59 kg/m.  Estimated Nutritional Needs:   Kcal:  2150-2350  Protein:  105-120 grams  Fluid:  1000 ml + UOP    Levada Schilling, RD, LDN, CDCES Registered Dietitian II Certified Diabetes Care and Education Specialist Please refer to [facility] for RD and/or RD  on-call/weekend/after hours pager

## 2022-12-14 NOTE — TOC Progression Note (Signed)
Transition of Care [facility]) - Progression Note    Patient Details  Name: Cynthia Dean MRN: [id number] Date of Birth: [birth year]  [facility] CM/SW Contact  Allena Katz, LCSW Phone Number: 12/14/2022, 10:46 AM  Clinical Narrative:    CSW spoke with Corrie Dandy from [employer] who suggests a formal evaluation to determine if pt has capacity by psych message communicated to team.          Expected Discharge Plan and Services                                               Social Determinants of Health (SDOH) Interventions SDOH Screenings   Food Insecurity: No Food Insecurity (10/12/2022)  Housing: Low Risk  (10/12/2022)  Transportation Needs: No Transportation Needs (10/12/2022)  Utilities: Not At Risk (10/12/2022)  Financial Resource Strain: Low Risk  (11/12/2017)  Physical Activity: Insufficiently Active (11/12/2017)  Social Connections: Moderately Integrated (11/12/2017)  Stress: No Stress Concern Present (11/12/2017)  Tobacco Use: Medium Risk (11/16/2022)    Readmission Risk Interventions     No data to display

## 2022-12-15 DIAGNOSIS — M4646 Discitis, unspecified, lumbar region: Secondary | ICD-10-CM | POA: Diagnosis not present

## 2022-12-15 NOTE — Progress Notes (Signed)
Progress Note   [name] ZOX:[id number] DOB: June 18, 1951 DOA: 10/12/2022     64 DOS: the patient was seen and examined on 12/15/2022     Subjective:  Patient seen and examined at bedside this morning I am awaiting psychiatric evaluation of patient concerning capacity She did not have any acute overnight events Denied nausea vomiting or chest pains Continues to await placement needs      Brief Narrative / Hospital Course:  Cynthia Dean is a 72 y.o. 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 She presents to the ED with worsening lower extremity pain. Admitted for possible Discitis/osteomyelitis at L4-L5: w/ suspected intradiscal abscess as per MRI. 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    4/18, HD access / fistula clotted off during dialysis. 4/19 pt taken for procedure to address fistula malfunction, but was agitated and combative, resfused procedure. 4/19 vascular placed R femoral temp HD cath placed, resumed HD and pemcath placed 4/24   Patient has had waxing and waning mental status with intermittently refusing dialysis. Patient's family have decided to continue hemodialysis after speaking with their PCP. Hospital course prolonged, complicated by patient's inability to tolerate sitting in recliner chair for dialysis.  She only agrees to dialysis in the bed.  This is a barrier to discharge as no outpatient dialysis centers can accommodate.       5/3: titrating up on fentanyl patch 12.5 --> 25 mcg, seems to be helping, if still unable to sit up in chair may consider increasing dose further    5/14- back pain seems to be well-controlled.  Caseworkers have discussed the case, will involve ethics committee/Hospital administration. Looking into home dialysis.    5/19 - d/w  patient, she states she doesn't want dialysis but cannot give a reason/explanation. I She voices she wants to live and that she needs dialysis to live. Lacks capacity to make informed decision. Continue defer to HCPOA.    5/21 - awaiting home visit to see if candidate for home dialysis      Consultants:  Neurosurgery Infectious Disease Vascular surgery  Nephrology    Procedures: 09/22/22: Stent placement to the AV graft  10/13/22: CT aspiration of right psoas muscle abscess yielding 2 ml purulent material  11/11/22: R Femoral V catheter placement 11/16/22: Permcath placement to L IJ            ASSESSMENT & PLAN:   Principal Problem:   Back pain Active Problems:   Anemia   Bilateral leg pain   ESRD on dialysis [facility])   Essential hypertension   Dyslipidemia   Seizure disorder (HCC)   Diabetic retinopathy without macular edema associated with type 2 diabetes mellitus (HCC)   GERD (gastroesophageal reflux disease)   Diabetic neuropathy (HCC)   Anemia of chronic disease   Insulin-requiring or dependent type II diabetes mellitus (HCC)   Discitis of lumbar region   Myositis   Psoas abscess (HCC)     Lumbar discitis, 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   Wound cx growing staph capitis.   ID consulted.  Abx de-escalated to cefazolin.   Not a good surgical candidate as per neuro surg.   Pt having severe low back and  bilateral leg pain and has not been able to tolerate any position except laying nearly flat in the bed S/p cefazolin, for 6 weeks, End Date: 12/02/22 pain management per orders -- palliative care following peripherally 5/3: titrate up on fentanyl patch 12.5 --> 25 mcg - pt reports pain is improved, but still unable to tolerate sitting in chair for dialysis    Confusion and agitation - improved Hospital  delirium  Pt was agitated, and also not responding to questions or commands during early hospitalization.  Mental status did start to improve, and now pt is calm and interactive, however, at this time she has been having waxing and waning mental status Delirium precautions cont seroquel 50 mg nightly to help with delirium and sleep.   ESRD: on HD MWF.  Pt refused HD on 10/17/22, 10/18/22 and 3/27, 4/1.   When attempts made to do HD in chair, she did not tolerate on account of severe back pain Recommend consideration for home hemodialysis if no facility capable to do HD in the bed Pt has voiced on occasion that she does not want dialysis and has been intermittently refusing this. Family encourages her to complete HD.  I have consulted psychiatrist for capacity evaluation   Generalized weakness / Physical Debility:  PT recs SNF. TOC following. Pt is bedridden and very high risk for developing pressure injuries of skin -- reposition frequently.   Monitor pressures areas on skin and offload as possible.   Severe back and bilateral leg pain due to severe multi-level lumbar degenerative disc disease. Pt has bulging discs at L2-3, L3-4, L4-5 and L5-S1. Pt will continue to have back and leg pain once antibiotics are complete. Pain mgmt per orders  Pain control complicated by AMS and hypotension with escalation of opiate dose - titrated up on fentanyl patch slowly and 25 mcg seems to be helpful, cautious use of IV fentanyl for breakthrough pain    ACD:  likely secondary ESRD. H&H are stable  Monitor periodic CBC   Hypokalemia Replace as needed Monitor BMP   Peripheral neuropathy --  Continue low dose gabapentin and monitor   Hx of seizures:  continue on home dose of Keppra   HLD:  continue on statin    Hx of COPD: w/o exacerbation.  Continue on bronchodilators    DM2: well controlled HbA1c 5.2.  d/c'ed BG checks    HTN:  Antihypertensives as blood pressure allows   GERD:   continue on PPI    Depression: severity unknown.  Continue on home dose of sertraline     Overweight: BMI 28.2.  Wt loss efforts recommended   Right foot pain  see peripheral neuropathy     DVT prophylaxis: heparin sq   Central lines / invasive devices: L IJ HD cath, port R chest    Code Status: DNR   Current Admission Status: inpatient    TOC needs / Dispo plan: SNF   Barriers to discharge / significant pending items: unable to sit to toelrate outpatient HD, working w/ PT, TOC involved     Family Communication: none at this time       Physical Exam Constitutional:      General: She is not in acute distress.    Appearance: Normal appearance. She is obese.  Cardiovascular:     Rate and Rhythm: Normal rate and regular rhythm.  Pulmonary:     Effort: Pulmonary effort is normal.     Breath sounds: Normal breath sounds.  Abdominal:  General: Abdomen is flat.     Palpations: Abdomen is soft.  Neurological:     Mental Status: She is alert. Mental status is at baseline.  Psychiatric:        Behavior: Behavior normal.      Data Reviewed: I have reviewed the patient's most recent laboratory results with sodium 137 potassium 3.9 creatinine 6.08   Time spent: 38 minutes    Vitals:   12/14/22 1717 12/14/22 2052 12/15/22 0424 12/15/22 1137  BP:  (!) 117/56 119/64 127/71  Pulse:  74 94 99  Resp:  18 20   Temp:  98.2 F (36.8 C) 98.3 F (36.8 C)   TempSrc:  Oral    SpO2:  94% 94% 93%  Weight: 89.2 kg     Height:         Author: Loyce Dys, MD 12/15/2022 1:38 PM  For on call review [email or web address].

## 2022-12-15 NOTE — Consult Note (Addendum)
  Attempted to complete evaluation due to recent psychiatric consult request. Patient noted lying in the bed with her fiance at bedside. Pateint unwilling to speak to this writer at this time due to privacy. This Clinical research associate asked for patient's fiance to step outside of the room, but he preferred to stay. Patient requested that psychiatry return at a later time to complete evaluation.

## 2022-12-16 DIAGNOSIS — F32A Depression, unspecified: Secondary | ICD-10-CM

## 2022-12-16 DIAGNOSIS — M4646 Discitis, unspecified, lumbar region: Secondary | ICD-10-CM | POA: Diagnosis not present

## 2022-12-16 LAB — CBC
HCT: 31.4 % — ABNORMAL LOW (ref 36.0–46.0)
Hemoglobin: 9.3 g/dL — ABNORMAL LOW (ref 12.0–15.0)
MCH: 25.9 pg — ABNORMAL LOW (ref 26.0–34.0)
MCHC: 29.6 g/dL — ABNORMAL LOW (ref 30.0–36.0)
MCV: 87.5 fL (ref 80.0–100.0)
Platelets: 210 10*3/uL (ref 150–400)
RBC: 3.59 MIL/uL — ABNORMAL LOW (ref 3.87–5.11)
RDW: 18.8 % — ABNORMAL HIGH (ref 11.5–15.5)
WBC: 8.2 10*3/uL (ref 4.0–10.5)
nRBC: 0 % (ref 0.0–0.2)

## 2022-12-16 LAB — RENAL FUNCTION PANEL
Albumin: 2.7 g/dL — ABNORMAL LOW (ref 3.5–5.0)
Anion gap: 12 (ref 5–15)
BUN: 24 mg/dL — ABNORMAL HIGH (ref 8–23)
CO2: 26 mmol/L (ref 22–32)
Calcium: 10.4 mg/dL — ABNORMAL HIGH (ref 8.9–10.3)
Chloride: 100 mmol/L (ref 98–111)
Creatinine, Ser: 5.46 mg/dL — ABNORMAL HIGH (ref 0.44–1.00)
GFR, Estimated: 8 mL/min — ABNORMAL LOW (ref >60)
Glucose, Bld: 141 mg/dL — ABNORMAL HIGH (ref 70–99)
Phosphorus: 4 mg/dL (ref 2.5–4.6)
Potassium: 3.4 mmol/L — ABNORMAL LOW (ref 3.5–5.1)
Sodium: 138 mmol/L (ref 135–145)

## 2022-12-16 MED ORDER — EPOETIN ALFA 10000 UNIT/ML IJ SOLN
INTRAMUSCULAR | Status: AC
  Filled 2022-12-16: qty 1

## 2022-12-16 MED ORDER — HEPARIN SODIUM (PORCINE) 1000 UNIT/ML IJ SOLN
INTRAMUSCULAR | Status: AC
  Filled 2022-12-16: qty 10

## 2022-12-16 MED ORDER — POTASSIUM CHLORIDE CRYS ER 20 MEQ PO TBCR
40.0000 meq | EXTENDED_RELEASE_TABLET | Freq: Once | ORAL | Status: AC
  Administered 2022-12-16: 40 meq via ORAL
  Filled 2022-12-16: qty 2

## 2022-12-16 MED ORDER — CINACALCET HCL 30 MG PO TABS
60.0000 mg | ORAL_TABLET | Freq: Every day | ORAL | Status: DC
  Administered 2022-12-16 – 2023-01-11 (×22): 60 mg via ORAL
  Filled 2022-12-16 (×28): qty 2

## 2022-12-16 MED ORDER — FENTANYL CITRATE PF 50 MCG/ML IJ SOSY
PREFILLED_SYRINGE | INTRAMUSCULAR | Status: AC
  Filled 2022-12-16: qty 1

## 2022-12-16 NOTE — Discharge Planning (Signed)
Attempted to call [facility] to discuss options for Home Hemo. No answer, left message.

## 2022-12-16 NOTE — TOC Progression Note (Signed)
Transition of Care [facility]) - Progression Note    Patient Details  Name: Cynthia Dean MRN: [id number] Date of Birth: April 24, 1951  Transition of Care [facility]) CM/SW Contact  Allena Katz, LCSW Phone Number: 12/16/2022, 10:05 AM  Clinical Narrative:     Psych attempted to complete eval with patient but unable to due to fiance being at bedside and not wanting to leave. Pt requested eval at a later date, Zettie Cooley with Ethics notified.        Expected Discharge Plan and Services                                               Social Determinants of Health (SDOH) Interventions SDOH Screenings   Food Insecurity: No Food Insecurity (10/12/2022)  Housing: Low Risk  (10/12/2022)  Transportation Needs: No Transportation Needs (10/12/2022)  Utilities: Not At Risk (10/12/2022)  Financial Resource Strain: Low Risk  (11/12/2017)  Physical Activity: Insufficiently Active (11/12/2017)  Social Connections: Moderately Integrated (11/12/2017)  Stress: No Stress Concern Present (11/12/2017)  Tobacco Use: Medium Risk (11/16/2022)    Readmission Risk Interventions     No data to display

## 2022-12-16 NOTE — Progress Notes (Signed)
Progress Note   [name] ZOX:[id number] DOB: [birth year] DOA: 10/12/2022     65 DOS: the patient was seen and examined on 12/16/2022     Subjective:  Patient seen and examined at bedside this morning Patient has been seen by psychiatry and apparently she does not have capacity Denied nausea vomiting or chest pains Continues to await placement needs       Brief Narrative / Hospital Course:  Cynthia Dean is a 72 y.o. 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 She presents to the ED with worsening lower extremity pain. Admitted for possible Discitis/osteomyelitis at L4-L5: w/ suspected intradiscal abscess as per MRI. 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    4/18, HD access / fistula clotted off during dialysis. 4/19 pt taken for procedure to address fistula malfunction, but was agitated and combative, resfused procedure. 4/19 vascular placed R femoral temp HD cath placed, resumed HD and pemcath placed 4/24   Patient has had waxing and waning mental status with intermittently refusing dialysis. Patient's family have decided to continue hemodialysis after speaking with their PCP. Hospital course prolonged, complicated by patient's inability to tolerate sitting in recliner chair for dialysis.  She only agrees to dialysis in the bed.  This is a barrier to discharge as no outpatient dialysis centers can accommodate.       5/3: titrating up on fentanyl patch 12.5 --> 25 mcg, seems to be helping, if still unable to sit up in chair may consider increasing dose further    5/14- back pain seems to be well-controlled.  Caseworkers have discussed the case, will involve ethics committee/Hospital administration. Looking into home dialysis.    5/19 - d/w patient, she states she doesn't want  dialysis but cannot give a reason/explanation. I She voices she wants to live and that she needs dialysis to live. Lacks capacity to make informed decision. Continue defer to HCPOA.    5/21 - awaiting home visit to see if candidate for home dialysis      Consultants:  Neurosurgery Infectious Disease Vascular surgery  Nephrology    Procedures: 09/22/22: Stent placement to the AV graft  10/13/22: CT aspiration of right psoas muscle abscess yielding 2 ml purulent material  11/11/22: R Femoral V catheter placement 11/16/22: Permcath placement to L IJ            ASSESSMENT & PLAN:   Principal Problem:   Back pain Active Problems:   Anemia   Bilateral leg pain   ESRD on dialysis [facility])   Essential hypertension   Dyslipidemia   Seizure disorder (HCC)   Diabetic retinopathy without macular edema associated with type 2 diabetes mellitus (HCC)   GERD (gastroesophageal reflux disease)   Diabetic neuropathy (HCC)   Anemia of chronic disease   Insulin-requiring or dependent type II diabetes mellitus (HCC)   Discitis of lumbar region   Myositis   Psoas abscess (HCC)     Lumbar discitis, 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   Wound cx grew staph capitis.   ID consulted.  Abx de-escalated to cefazolin.   Not a good surgical candidate as per neuro surg.   Pt having severe low back and bilateral leg pain  and has not been able to tolerate any position except laying nearly flat in the bed S/p cefazolin, for 6 weeks, End Date: 12/02/22 pain management per orders -- palliative care following peripherally 5/3: titrate up on fentanyl patch 12.5 --> 25 mcg - pt reports pain is improved, but still unable to tolerate sitting in chair for dialysis    Confusion and agitation - improved Hospital delirium  Pt was agitated, and also not  responding to questions or commands during early hospitalization.  Mental status did start to improve, and now pt is calm and interactive, however, at this time she has been having waxing and waning mental status Delirium precautions cont seroquel 50 mg nightly to help with delirium and sleep.   ESRD: on HD MWF.  Pt refused HD on 10/17/22, 10/18/22 and 3/27, 4/1.   When attempts made to do HD in chair, she did not tolerate on account of severe back pain Recommend consideration for home hemodialysis if no facility capable to do HD in the bed Seen by psychiatry team on 12/16/2022 and apparently patient lacks capacity Pt has voiced on occasion that she does not want dialysis and has been intermittently refusing this. Family encourages her to complete HD.     Generalized weakness / Physical Debility:  PT recs SNF. TOC following. Pt is bedridden and very high risk for developing pressure injuries of skin -- reposition frequently.   Monitor pressures areas on skin and offload as possible.   Severe back and bilateral leg pain due to severe multi-level lumbar degenerative disc disease. Pt has bulging discs at L2-3, L3-4, L4-5 and L5-S1. Pt will continue to have back and leg pain once antibiotics are complete. Pain mgmt per orders  Pain control complicated by AMS and hypotension with escalation of opiate dose - titrated up on fentanyl patch slowly and 25 mcg seems to be helpful, cautious use of IV fentanyl for breakthrough pain    ACD:  likely secondary ESRD. H&H are stable  Monitor periodic CBC   Hypokalemia Replace as needed Monitor BMP   Peripheral neuropathy --  Continue low dose gabapentin and monitor   Hx of seizures:  continue on home dose of Keppra   HLD:  continue on statin    Hx of COPD: w/o exacerbation.  Continue on bronchodilators    DM2: well controlled HbA1c 5.2.  d/c'ed BG checks    HTN:  Antihypertensives as blood pressure allows   GERD:  continue on PPI     Depression: severity unknown.  Continue on home dose of sertraline     Overweight: BMI 28.2.  Wt loss efforts recommended   Right foot pain  see peripheral neuropathy     DVT prophylaxis: heparin sq   Central lines / invasive devices: L IJ HD cath, port R chest    Code Status: DNR   Current Admission Status: inpatient    TOC needs / Dispo plan: SNF   Barriers to discharge / significant pending items: unable to sit to toelrate outpatient HD, working w/ PT, TOC involved     Family Communication: none at this time       Physical Exam Constitutional:      General: She is not in acute distress.    Appearance: Normal appearance. She is obese.  Cardiovascular:     Rate and Rhythm: Normal rate and regular rhythm.  Pulmonary:     Effort: Pulmonary effort is normal.     Breath sounds: Normal breath sounds.  Abdominal:  General: Abdomen is flat.     Palpations: Abdomen is soft.  Neurological:     Mental Status: She is alert. Mental status is at baseline.  Psychiatric:        Behavior: Behavior normal.      Data Reviewed: I have reviewed the patient's most recent laboratory  Showing sodium 138 potassium 3.4 creatinine 5.4  Time spent:     Vitals:   12/16/22 1130 12/16/22 1200 12/16/22 1230 12/16/22 1235  BP: 128/79 120/77 124/75 128/68  Pulse: 91 91 90 94  Resp: 20 20 17 12   Temp:    97.6 F (36.4 C)  TempSrc:    Oral  SpO2: 100% 100% 99% 98%  Weight:    85.4 kg  Height:        Author: Loyce Dys, MD 12/16/2022 2:42 PM  For on call review [email or web address].

## 2022-12-16 NOTE — Progress Notes (Signed)
[facility]  PROGRESS NOTE   Subjective:   Patient seen resting in bed Sleep on arrival, aroused easily No family at bedside Patient seen during dialysis Tolerating well    HEMODIALYSIS FLOWSHEET:  Blood Flow Rate (mL/min): 350 mL/min Arterial Pressure (mmHg): -170 mmHg Venous Pressure (mmHg): 240 mmHg TMP (mmHg): -2 mmHg Ultrafiltration Rate (mL/min): 327 mL/min Dialysate Flow Rate (mL/min): 300 ml/min Dialysis Fluid Bolus: Normal Saline Bolus Amount (mL): 500 mL     Objective:  Vital signs: Blood pressure 128/68, pulse 94, temperature 97.6 F (36.4 C), temperature source Oral, resp. rate 12, height 6' (1.829 m), weight 85.4 kg, SpO2 98 %.  Intake/Output Summary (Last 24 hours) at 12/16/2022 1548 Last data filed at 12/16/2022 1235 Gross per 24 hour  Intake --  Output 1000 ml  Net -1000 ml    Filed Weights   12/14/22 1338 12/14/22 1717 12/16/22 1235  Weight: 89.2 kg 89.2 kg 85.4 kg     Physical Exam: General:  No acute distress, laying in bed  Head:  Normocephalic, atraumatic. Moist oral mucosal membranes  Eyes:  Anicteric  Lungs:   Clear to auscultation, normal effort  Heart:  Irregular  Abdomen:   Soft, nontender, bowel sounds present  Extremities:  No peripheral edema.  Neurologic:  Alert and oriented to self and place  Skin:  No lesions  Access:  LIJ permcath    Basic Metabolic Panel: Recent Labs  Lab 12/12/22 0917 12/13/22 0926 12/14/22 1337 12/16/22 0852  NA 137 138 137 138  K 3.6 3.7 3.9 3.4*  CL 97* 98 102 100  CO2 28 28 26 26   GLUCOSE 126* 111* 138* 141*  BUN 42* 47* 25* 24*  CREATININE 8.15* 8.98* 6.08* 5.46*  CALCIUM 10.5* 10.5* 10.4* 10.4*  PHOS 5.8* 6.2* 4.5 4.0    GFR: Estimated Creatinine Clearance: 10.9 mL/min (A) (by C-G formula based on SCr of 5.46 mg/dL (H)).  Liver Function Tests: Recent Labs  Lab 12/12/22 0917 12/13/22 0926 12/14/22 1337 12/16/22 0852  ALBUMIN 2.5* 2.4* 2.6* 2.7*    No results for  input(s): "LIPASE", "AMYLASE" in the last 168 hours. No results for input(s): "AMMONIA" in the last 168 hours.  CBC: Recent Labs  Lab 12/12/22 0917 12/13/22 0926 12/14/22 1337 12/16/22 0852  WBC 6.4 6.9 7.1 8.2  HGB 9.0* 8.8* 9.0* 9.3*  HCT 30.0* 30.0* 30.9* 31.4*  MCV 86.0 85.7 86.1 87.5  PLT 231 232 210 210      HbA1C: Hemoglobin A1C  Date/Time Value Ref Range Status  06/15/2014 12:58 AM 7.9 (H) 4.2 - 6.3 % Final    Comment:    The American Diabetes Association recommends that a primary goal of therapy should be <7% and that physicians should reevaluate the treatment regimen in patients with HbA1c values consistently >8%.   12/24/2013 11:18 AM 8.2 (H) 4.2 - 6.3 % Final    Comment:    The American Diabetes Association recommends that a primary goal of therapy should be <7% and that physicians should reevaluate the treatment regimen in patients with HbA1c values consistently >8%.    Hgb A1c MFr Bld  Date/Time Value Ref Range Status  10/13/2022 11:46 AM 5.2 4.8 - 5.6 % Final    Comment:    (NOTE)         Prediabetes: 5.7 - 6.4         Diabetes: >6.4         Glycemic control for adults with diabetes: <7.0  03/23/2022 06:47 AM 5.8 (H) 4.8 - 5.6 % Final    Comment:    (NOTE) Pre diabetes:          5.7%-6.4%  Diabetes:              >6.4%  Glycemic control for   <7.0% adults with diabetes     Urinalysis: No results for input(s): "COLORURINE", "LABSPEC", "PHURINE", "GLUCOSEU", "HGBUR", "BILIRUBINUR", "KETONESUR", "PROTEINUR", "UROBILINOGEN", "NITRITE", "LEUKOCYTESUR" in the last 72 hours.  Invalid input(s): "APPERANCEUR"    Imaging: No results found.   Medications:    sodium chloride Stopped (11/16/22 1155)   anticoagulant sodium citrate      (feeding supplement) PROSource Plus  30 mL Oral BID BM   cephALEXin  500 mg Oral QHS   Chlorhexidine Gluconate Cloth  6 each Topical Q0600   cinacalcet  30 mg Oral Q supper   epoetin (EPOGEN/PROCRIT) injection   10,000 Units Intravenous Q M,W,F-HD   feeding supplement (NEPRO CARB STEADY)  237 mL Oral TID BM   fentaNYL  1 patch Transdermal Q72H   gabapentin  100 mg Oral Once per day on Mon Wed Fri   heparin injection (subcutaneous)  5,000 Units Subcutaneous Q8H   levETIRAcetam  1,000 mg Oral Daily   levETIRAcetam  250 mg Oral Q M,W,F   multivitamin  1 tablet Oral QHS   pantoprazole  20 mg Oral BID   potassium chloride  40 mEq Oral Once   psyllium  1 packet Oral Daily   QUEtiapine  25 mg Oral QHS   sertraline  25 mg Oral Daily   sevelamer carbonate  2.4 g Oral TID WC   sodium chloride flush  10-40 mL Intracatheter Q12H   umeclidinium-vilanterol  1 puff Inhalation Daily    Assessment/ Plan:     [name] is a 72 y.o. female with end stage renal disease on hemodialysis, CAD, COPD with intermittent home O2, hypertension, hyperlipidemia, stroke, diabetes who has been admitted since 3/20. Initially admitted for vertebral osteomyelitis. She has completed IV antibiotics. Now is working on placement.   Due to extended hospitalization, patient will need outpatient dialysis clinic re-initiated.   [facility] TTS [facility]J permcath   #1 ESRD:  -Continue maintenance HD MWF. -Barriers to outpatient dialysis-patient is not able to sit in a chair   #2: Anemia with chronic kidney disease: hemoglobin 9.3 - Continue EPO 10000 units with dialysis.    #3: Second hyperparathyroidism: With hypercalcemia Lab Results  Component Value Date   PTH 157 (H) 12/08/2022   CALCIUM 10.4 (H) 12/16/2022   CAION 1.09 (L) 08/28/2022   PHOS 4.0 12/16/2022    -  phosphorus within desired range -Noted to have hypercalcemia-may be due to prolonged immobilization. -Will increase the dose of Cinacalcet to 60 mg daily. - Continue sevelamer with meals.    #4: Hypertension Not currently on any blood pressure agents. Blood pressure soft but stable for this patient.    LOS: 65 [name]  [facility] 5/24/20243:48 PM

## 2022-12-16 NOTE — Progress Notes (Signed)
Received patient in bed to unit.    Informed consent signed and in chart.    TX duration:3.5     Transported By hospital transport back to room Hand-off given to patient's nurse. Daria Pastures RN   Access used: L PC Access issues: None   Total UF removed: 1000 Medication(s) given:Fentanyl Post HD VS: Stable Post HD weight:86.5kg    [facility]

## 2022-12-16 NOTE — Consult Note (Addendum)
[facility] Face-to-Face Psychiatry Consult   Reason for Consult:  Evaluation for Capacity to make medical decisions Referring Physician:  Rosezetta Schlatter MD Patient Identification: Marie Lorenzetti MRN:  [id number] Principal Diagnosis: Back pain Diagnosis:  Principal Problem:   Back pain Active Problems:   Essential hypertension   Seizure disorder (HCC)   Diabetic retinopathy without macular edema associated with type 2 diabetes mellitus (HCC)   GERD (gastroesophageal reflux disease)   Dyslipidemia   Diabetic neuropathy (HCC)   Anemia of chronic disease   ESRD on dialysis (HCC)   Bilateral leg pain   Anemia   Insulin-requiring or dependent type II diabetes mellitus (HCC)   Discitis of lumbar region   Myositis   Psoas abscess (HCC)   Malnutrition of moderate degree   Total Time spent with patient: 20 minutes   HPI:  Fama Judie Mean is a 72 yo F with past psychiatric history of depression. She is admitted to the medical service with past medical history including CAD, COPD with intermittent home O2, hypertension, hyperlipidemia, stroke, diabetes and end stage renal disease on hemodialysis. She presents to the ED with worsening lower extremity pain. Admitted for possible Discitis/osteomyelitis at L4-L5: w/ suspected intradiscal abscess as per MRI. Found to have intradiscal abscess/lumbar discitis/b/l psoas abscess, status post IR guided drainage. Psych has been consulted to evaluate M(r)s Bidgood capacity to refuse hemodialysis.  Mental Capacity Assessment:  I have evaluated the following areas to assess Larey Seat Wanless's mental capacity regarding medical decision-making ability which pertains to competency to accept or refuse medical treatment:   The specific treatment or service in question is: Hemodialysis  Communication: The patient was able to clearly state preferred treatment options for her medical care at this time. The patient was able to state that she does NOT  want dialysis treatment, as stated in Dr Adria Devon progress note she is unable to elaborate on WHY she does not want hemodialysis. When asked why she does not want dialysis she states "none of those people need it", when I asked what she meant by that she said "all of those people out there in the world". Factors that could compromise this communication process include: nephrotoxicity, hyperuremia, hypothyroidism, hypothermia, and sepsis.     Understanding: The patient was able to recall information. She was unable to link causal relationships, and process general probabilities regarding life situations and medical treatment scenarios, she was unable to understand that stopping dialysis will result in death. She was not able to paraphrase her view of the current situation and her thoughts about it. The patient did present with impairments in memory and attention span.   Appreciation: The patient was not able to independently identify and describe her various illnesses and treatment options with potential outcomes. She did acknowledge that she is on dialysis and states "my children want me to, so I'm getting it." The patient did not present with concerns such as denial or delusional thought-process.   Rationalization: The patient was not able to weigh risks and benefits and come to a conclusion congruent with patient's perceived goals. She states that she wants to live but also states that she does not want dialysis. I explained that the two goals do not align and stated plainly that if she were to stop dialysis she would likely die within weeks. She continues to state she does not want dialysis and that she wants to live. Factors that could compromise this include depression, acute/chronic encephalopathy, or electrolyte imbalances.   Conclusion: At this  time, there is sufficient evidence to support M(r)s Heberlein being unable to make an informed decision to stop hemodialysis. At this time, we can determine that  the patient does not have functional mental capacity to accept or refuse dialysis treatment.  .     Capacity is fluctuating.Capacity is a functional assessment and a clinical determination about a specific decision. Source: Sherryll Burger [place], Susan Moore, [place]. Ten myths about decisionmaking capacity. J Am Med Dir Assoc. 2004;5(4):263-267.  Of note, this capacity evaluation assesses only for the specified decision documented above at the time of the assessment and is not a substitute for determination of the patient's overall competency, which can only be adjudicated.   Past Psychiatric History: Depression  Risk to Self:   Risk to Others:   Prior Inpatient Therapy:   Prior Outpatient Therapy:    Past Medical History:  Past Medical History:  Diagnosis Date   Acute on chronic respiratory failure with hypoxia (HCC) 07/30/2019   Acute respiratory failure with hypoxia (HCC) 12/31/2018   Carotid arterial disease (HCC)    a. 02/2018 < bilat ICA stenosis.   COPD (chronic obstructive pulmonary disease) (HCC)    Coronary artery disease    a. 11/2015 MV: EF 57%, no ischemia/infarct.   Cryptogenic stroke (HCC)    a. 10/2017 s/p implantable loop recorder (no Afib to date).   Diabetes mellitus without complication (HCC)    Diarrhea    ESRD on dialysis [facility])    GI bleed    a. 01/2019 EGD/Colonoscopy: Mild gastritis. Colitis.   Heart murmur    a. 12/2018 Echo: EF 55-60%, impaired relaxation. Mod dil LA. Mildly dil RA. Mild Ca2+ of AoV.   Hematemesis 06/27/2017   History of 2019 novel coronavirus disease (COVID-19) 07/30/2019   Hyperkalemia    Hyperlipidemia    Hypertension    Intractable nausea and vomiting 08/08/2015   Nausea & vomiting 05/30/2018   Nausea and vomiting 05/29/2018   Pneumonia due to COVID-19 virus     Past Surgical History:  Procedure Laterality Date   A/V FISTULAGRAM Left 12/20/2016   Procedure: A/V Fistulagram;  Surgeon: Renford Dills, MD;  Location: [facility]  INVASIVE CV LAB;  Service: Cardiovascular;  Laterality: Left;   A/V FISTULAGRAM Left 09/22/2022   Procedure: A/V Fistulagram;  Surgeon: Annice Needy, MD;  Location: [facility] INVASIVE CV LAB;  Service: Cardiovascular;  Laterality: Left;   A/V SHUNT INTERVENTION N/A 12/20/2016   Procedure: A/V Shunt Intervention;  Surgeon: Renford Dills, MD;  Location: [facility] INVASIVE CV LAB;  Service: Cardiovascular;  Laterality: N/A;   A/V SHUNTOGRAM Left 09/11/2017   Procedure: A/V SHUNTOGRAM;  Surgeon: Annice Needy, MD;  Location: [facility] INVASIVE CV LAB;  Service: Cardiovascular;  Laterality: Left;   A/V SHUNTOGRAM Left 11/21/2019   Procedure: A/V SHUNTOGRAM;  Surgeon: Annice Needy, MD;  Location: [facility] INVASIVE CV LAB;  Service: Cardiovascular;  Laterality: Left;   BREAST BIOPSY Bilateral 07/19/2000   neg   BREAST BIOPSY Left 11/03/2021   [place] bx mass at 3:00, venus marker, axilla bx-hyrdo marker, path pending   COLONOSCOPY WITH PROPOFOL N/A 02/16/2019   Procedure: COLONOSCOPY WITH PROPOFOL;  Surgeon: Toledo, Boykin Nearing, MD;  Location: [facility] ENDOSCOPY;  Service: Gastroenterology;  Laterality: N/A;   COLONOSCOPY WITH PROPOFOL N/A 08/31/2022   Procedure: COLONOSCOPY WITH PROPOFOL;  Surgeon: Jaynie Collins, DO;  Location: [facility] ENDOSCOPY;  Service: Gastroenterology;  Laterality: N/A;   DIALYSIS/PERMA CATHETER INSERTION N/A 11/16/2022   Procedure:  DIALYSIS/PERMA CATHETER INSERTION;  Surgeon: Annice Needy, MD;  Location: [facility] INVASIVE CV LAB;  Service: Cardiovascular;  Laterality: N/A;   ESOPHAGOGASTRODUODENOSCOPY (EGD) WITH PROPOFOL N/A 02/16/2019   Procedure: ESOPHAGOGASTRODUODENOSCOPY (EGD) WITH PROPOFOL;  Surgeon: Toledo, Boykin Nearing, MD;  Location: [facility] ENDOSCOPY;  Service: Gastroenterology;  Laterality: N/A;   ESOPHAGOGASTRODUODENOSCOPY (EGD) WITH PROPOFOL N/A 08/28/2022   Procedure: ESOPHAGOGASTRODUODENOSCOPY (EGD) WITH PROPOFOL;  Surgeon: Jaynie Collins, DO;  Location: [facility] ENDOSCOPY;  Service:  Gastroenterology;  Laterality: N/A;   IR US GUIDE BX ASP/DRAIN  03/25/2022   LOOP RECORDER INSERTION N/A 11/16/2017   Procedure: LOOP RECORDER INSERTION;  Surgeon: Duke Salvia, MD;  Location: [facility] INVASIVE CV LAB;  Service: Cardiovascular;  Laterality: N/A;   PERIPHERAL VASCULAR CATHETERIZATION Left 02/02/2015   Procedure: A/V Shuntogram/Fistulagram;  Surgeon: Annice Needy, MD;  Location: [facility] INVASIVE CV LAB;  Service: Cardiovascular;  Laterality: Left;   PERIPHERAL VASCULAR CATHETERIZATION Left 02/02/2015   Procedure: A/V Shunt Intervention;  Surgeon: Annice Needy, MD;  Location: [facility] INVASIVE CV LAB;  Service: Cardiovascular;  Laterality: Left;   PERIPHERAL VASCULAR CATHETERIZATION Left 03/09/2015   Procedure: A/V Shuntogram/Fistulagram;  Surgeon: Annice Needy, MD;  Location: [facility] INVASIVE CV LAB;  Service: Cardiovascular;  Laterality: Left;   PERIPHERAL VASCULAR CATHETERIZATION N/A 03/09/2015   Procedure: A/V Shunt Intervention;  Surgeon: Annice Needy, MD;  Location: [facility] INVASIVE CV LAB;  Service: Cardiovascular;  Laterality: N/A;   PERIPHERAL VASCULAR CATHETERIZATION Left 04/17/2015   Procedure: Upper Extremity Angiography;  Surgeon: Annice Needy, MD;  Location: [facility] INVASIVE CV LAB;  Service: Cardiovascular;  Laterality: Left;   PERIPHERAL VASCULAR CATHETERIZATION  04/17/2015   Procedure: Upper Extremity Intervention;  Surgeon: Annice Needy, MD;  Location: [facility] INVASIVE CV LAB;  Service: Cardiovascular;;   PERIPHERAL VASCULAR CATHETERIZATION N/A 08/10/2015   Procedure: A/V Shuntogram/Fistulagram;  Surgeon: Annice Needy, MD;  Location: [facility] INVASIVE CV LAB;  Service: Cardiovascular;  Laterality: N/A;   PERIPHERAL VASCULAR CATHETERIZATION N/A 08/10/2015   Procedure: A/V Shunt Intervention;  Surgeon: Annice Needy, MD;  Location: [facility] INVASIVE CV LAB;  Service: Cardiovascular;  Laterality: N/A;   PERIPHERAL VASCULAR CATHETERIZATION Left 04/11/2016   Procedure: A/V Shuntogram/Fistulagram;  Surgeon:  Annice Needy, MD;  Location: [facility] INVASIVE CV LAB;  Service: Cardiovascular;  Laterality: Left;   TEE WITHOUT CARDIOVERSION N/A 11/16/2017   Procedure: TRANSESOPHAGEAL ECHOCARDIOGRAM (TEE);  Surgeon: Antonieta Iba, MD;  Location: [facility] ORS;  Service: Cardiovascular;  Laterality: N/A;   TEMPORARY DIALYSIS CATHETER N/A 11/11/2022   Procedure: TEMPORARY DIALYSIS CATHETER;  Surgeon: Annice Needy, MD;  Location: [facility] INVASIVE CV LAB;  Service: Cardiovascular;  Laterality: N/A;   Family History:  Family History  Problem Relation Age of Onset   Breast cancer Father 71   Stroke Mother    Heart attack Mother    Heart Problems Sister    Breast cancer Cousin 55       1 st cousin. Maternal     Social History:  Social History   Substance and Sexual Activity  Alcohol Use No     Social History   Substance and Sexual Activity  Drug Use No    Social History   Socioeconomic History   Marital status: Single    Spouse name: Not on file   Number of children: 5   Years of education: Not on file   Highest education level: Not on file  Occupational History   Occupation: Disability   Tobacco Use  Smoking status: Former    Packs/day: 0    Types: Cigarettes   Smokeless tobacco: Never  Vaping Use   Vaping Use: Never used  Substance and Sexual Activity   Alcohol use: No   Drug use: No   Sexual activity: Not Currently  Other Topics Concern   Not on file  Social History Narrative   Live with Fiance   Social Determinants of Health   Financial Resource Strain: Low Risk  (11/12/2017)   Overall Financial Resource Strain (CARDIA)    Difficulty of Paying Living Expenses: Not hard at all  Food Insecurity: No Food Insecurity (10/12/2022)   Hunger Vital Sign    Worried About Running Out of Food in the Last Year: Never true    Ran Out of Food in the Last Year: Never true  Transportation Needs: No Transportation Needs (10/12/2022)   PRAPARE - Administrator, Civil Service (Medical):  No    Lack of Transportation (Non-Medical): No  Physical Activity: Insufficiently Active (11/12/2017)   Exercise Vital Sign    Days of Exercise per Week: 5 days    Minutes of Exercise per Session: 20 min  Stress: No Stress Concern Present (11/12/2017)   [employer] of Occupational Health - Occupational Stress Questionnaire    Feeling of Stress : Not at all  Social Connections: Moderately Integrated (11/12/2017)   Social Connection and Isolation Panel [NHANES]    Frequency of Communication with Friends and Family: Twice a week    Frequency of Social Gatherings with Friends and Family: Twice a week    Attends Religious Services: More than 4 times per year    Active Member of [employer] or Organizations: Yes    Attends Engineer, structural: More than 4 times per year    Marital Status: Never married   Additional Social History:    Allergies:   Allergies  Allergen Reactions   Oxycodone Nausea And Vomiting   Oxycodone-Acetaminophen Other (See Comments)   Wound Dressing Adhesive    Hydrocodone     Intolerant more than allergic   Tape Itching    Skin Dermatitis/itching (tape adhesive) Skin Dermatitis/itching (tape adhesive)   Tapentadol Itching    Skin Dermatitis/itching (tape adhesive) Skin Dermatitis/itching (tape adhesive)     Labs:  Results for orders placed or performed during the hospital encounter of 10/12/22 (from the past 48 hour(s))  CBC     Status: Abnormal   Collection Time: 12/14/22  1:37 PM  Result Value Ref Range   WBC 7.1 4.0 - 10.5 K/uL   RBC 3.59 (L) 3.87 - 5.11 MIL/uL   Hemoglobin 9.0 (L) 12.0 - 15.0 g/dL   HCT 16.1 (L) 09.6 - 04.5 %   MCV 86.1 80.0 - 100.0 fL   MCH 25.1 (L) 26.0 - 34.0 pg   MCHC 29.1 (L) 30.0 - 36.0 g/dL   RDW 40.9 (H) 81.1 - 91.4 %   Platelets 210 150 - 400 K/uL   nRBC 0.0 0.0 - 0.2 %    Comment: Performed at [facility], [address] [place], [place] [address]  Renal function panel     Status: Abnormal    Collection Time: 12/14/22  1:37 PM  Result Value Ref Range   Sodium 137 135 - 145 mmol/L   Potassium 3.9 3.5 - 5.1 mmol/L   Chloride 102 98 - 111 mmol/L   CO2 26 22 - 32 mmol/L   Glucose, Bld 138 (H) 70 - 99 mg/dL  Comment: Glucose reference range applies only to samples taken after fasting for at least 8 hours.   BUN 25 (H) 8 - 23 mg/dL   Creatinine, Ser 1.61 (H) 0.44 - 1.00 mg/dL   Calcium 09.6 (H) 8.9 - 10.3 mg/dL   Phosphorus 4.5 2.5 - 4.6 mg/dL   Albumin 2.6 (L) 3.5 - 5.0 g/dL   GFR, Estimated 7 (L) >60 mL/min    Comment: (NOTE) Calculated using the CKD-EPI Creatinine Equation (2021)    Anion gap 9 5 - 15    Comment: Performed at [facility], [address] [place], [place] [address]  CBC     Status: Abnormal   Collection Time: 12/16/22  8:52 AM  Result Value Ref Range   WBC 8.2 4.0 - 10.5 K/uL   RBC 3.59 (L) 3.87 - 5.11 MIL/uL   Hemoglobin 9.3 (L) 12.0 - 15.0 g/dL   HCT 98.1 (L) 19.1 - 47.8 %   MCV 87.5 80.0 - 100.0 fL   MCH 25.9 (L) 26.0 - 34.0 pg   MCHC 29.6 (L) 30.0 - 36.0 g/dL   RDW 29.5 (H) 62.1 - 30.8 %   Platelets 210 150 - 400 K/uL   nRBC 0.0 0.0 - 0.2 %    Comment: Performed at [facility], [address] [place], [place] [address]  Renal function panel     Status: Abnormal   Collection Time: 12/16/22  8:52 AM  Result Value Ref Range   Sodium 138 135 - 145 mmol/L   Potassium 3.4 (L) 3.5 - 5.1 mmol/L   Chloride 100 98 - 111 mmol/L   CO2 26 22 - 32 mmol/L   Glucose, Bld 141 (H) 70 - 99 mg/dL    Comment: Glucose reference range applies only to samples taken after fasting for at least 8 hours.   BUN 24 (H) 8 - 23 mg/dL   Creatinine, Ser 6.96 (H) 0.44 - 1.00 mg/dL   Calcium 29.5 (H) 8.9 - 10.3 mg/dL   Phosphorus 4.0 2.5 - 4.6 mg/dL   Albumin 2.7 (L) 3.5 - 5.0 g/dL   GFR, Estimated 8 (L) >60 mL/min    Comment: (NOTE) Calculated using the CKD-EPI Creatinine Equation (2021)    Anion gap 12 5 - 15    Comment: Performed at [facility], [address] [place], [place] [address]    Current Facility-Administered Medications  Medication Dose Route Frequency Provider Last Rate Last Admin   (feeding supplement) PROSource Plus liquid 30 mL  30 mL Oral BID BM Annice Needy, MD   30 mL at 12/16/22 0824   0.9 %  sodium chloride infusion   Intravenous Continuous Annice Needy, MD   Held at 11/16/22 1155   anticoagulant sodium citrate solution 5 mL  5 mL Intracatheter PRN Annice Needy, MD       cephALEXin (KEFLEX) capsule 500 mg  500 mg Oral QHS Lynn Ito, MD   500 mg at 12/15/22 2113   Chlorhexidine Gluconate Cloth 2 % PADS 6 each  6 each Topical Q0600 Lorain Childes, MD   6 each at 12/16/22 0525   cinacalcet (SENSIPAR) tablet 30 mg  30 mg Oral Q supper Annice Needy, MD   30 mg at 12/15/22 1715   epoetin alfa (EPOGEN) injection 10,000 Units  10,000 Units Intravenous Q M,W,F-HD Kolluru, Sarath, MD   10,000 Units at 12/14/22 1554   feeding supplement (NEPRO CARB STEADY) liquid 237 mL  237 mL Oral TID  BM Annice Needy, MD   237 mL at 12/16/22 0824   fentaNYL (DURAGESIC) 25 MCG/HR 1 patch  1 patch Transdermal Q72H Sunnie Nielsen, DO   1 patch at 12/13/22 1743   fentaNYL (SUBLIMAZE) injection 12.5 mcg  12.5 mcg Intravenous Q2H PRN Sunnie Nielsen, DO   12.5 mcg at 12/16/22 0908   gabapentin (NEURONTIN) capsule 100 mg  100 mg Oral Once per day on Mon Wed Fri Dew, Jason S, MD   100 mg at 12/14/22 2108   guaiFENesin (ROBITUSSIN) 100 MG/5ML liquid 5 mL  5 mL Oral Q4H PRN Sunnie Nielsen, DO   5 mL at 11/27/22 1715   heparin injection 5,000 Units  5,000 Units Subcutaneous Q8H Sunnie Nielsen, DO   5,000 Units at 12/16/22 4098   hydrALAZINE (APRESOLINE) injection 20 mg  20 mg Intravenous Q6H PRN Annice Needy, MD       levETIRAcetam (KEPPRA) tablet 1,000 mg  1,000 mg Oral Daily Annice Needy, MD   1,000 mg at 12/16/22 1191   levETIRAcetam (KEPPRA) tablet 250 mg  250 mg Oral Q M,W,F Annice Needy, MD   250 mg at  12/14/22 1829   lidocaine (PF) (XYLOCAINE) 1 % injection 5 mL  5 mL Intradermal PRN Annice Needy, MD       lidocaine-prilocaine (EMLA) cream 1 Application  1 Application Topical PRN Annice Needy, MD       LORazepam (ATIVAN) tablet 1 mg  1 mg Oral Q6H PRN Annice Needy, MD   1 mg at 12/16/22 4782   multivitamin (RENA-VIT) tablet 1 tablet  1 tablet Oral QHS Rosezetta Schlatter T, MD   1 tablet at 12/15/22 2113   ondansetron (ZOFRAN-ODT) disintegrating tablet 4 mg  4 mg Oral Q6H PRN Annice Needy, MD   4 mg at 12/06/22 0939   pantoprazole (PROTONIX) EC tablet 20 mg  20 mg Oral BID Annice Needy, MD   20 mg at 12/16/22 0824   phenol (CHLORASEPTIC) mouth spray 1 spray  1 spray Mouth/Throat PRN Sunnie Nielsen, DO       potassium chloride (KLOR-CON M) CR tablet 40 mEq  40 mEq Oral Once Loyce Dys, MD       psyllium (HYDROCIL/METAMUCIL) 1 packet  1 packet Oral Daily Annice Needy, MD   1 packet at 12/16/22 9562   QUEtiapine (SEROQUEL) tablet 25 mg  25 mg Oral QHS Annice Needy, MD   25 mg at 12/15/22 2112   senna-docusate (Senokot-S) tablet 1 tablet  1 tablet Oral QHS PRN Annice Needy, MD   1 tablet at 12/13/22 2028   sertraline (ZOLOFT) tablet 25 mg  25 mg Oral Daily Annice Needy, MD   25 mg at 12/16/22 0825   sevelamer carbonate (RENVELA) powder PACK 2.4 g  2.4 g Oral TID WC Kolluru, Sarath, MD   2.4 g at 12/16/22 0825   sodium chloride flush (NS) 0.9 % injection 10-40 mL  10-40 mL Intracatheter Q12H Annice Needy, MD   10 mL at 12/15/22 2000   sodium chloride flush (NS) 0.9 % injection 10-40 mL  10-40 mL Intracatheter PRN Annice Needy, MD   10 mL at 10/24/22 2102   umeclidinium-vilanterol (ANORO ELLIPTA) 62.5-25 MCG/ACT 1 puff  1 puff Inhalation Daily Annice Needy, MD   1 puff at 12/14/22 1308    Musculoskeletal: Strength & Muscle Tone: decreased Gait & Station:  Unable to assess, lying in bed being  dialyzed Patient leans:  Unable to assess, lying in bed being dialyzed    Psychiatric Specialty  Exam:  Presentation  General Appearance:  Casual; Disheveled  Eye Contact: Minimal  Speech: Slow  Speech Volume: Decreased  Handedness: Right   Mood and Affect  Mood: Euthymic  Affect: Flat   Thought Process  Thought Processes: Coherent  Descriptions of Associations:Circumstantial  Orientation:Partial  Thought Content:Scattered  History of Schizophrenia/Schizoaffective disorder:No data recorded Duration of Psychotic Symptoms:No data recorded Hallucinations:Hallucinations: None  Ideas of Reference:None  Suicidal Thoughts:Suicidal Thoughts: No  Homicidal Thoughts:Homicidal Thoughts: No   Sensorium  Memory: Immediate Poor; Recent Poor; Remote Poor  Judgment: Poor  Insight: Poor   Executive Functions  Concentration: Poor  Attention Span: Poor  Recall: Poor  Fund of Knowledge: Fair  Language: Fair   Psychomotor Activity  Psychomotor Activity: Psychomotor Activity: Other (comment) (Laying in bed)   Assets  Assets: Social Support   Sleep  Sleep: Sleep: Fair   Physical Exam: Physical Exam HENT:     Head: Normocephalic.     Mouth/Throat:     Mouth: Mucous membranes are dry.  Cardiovascular:     Rate and Rhythm: Normal rate.  Pulmonary:     Effort: Pulmonary effort is normal. No respiratory distress.  Skin:    General: Skin is warm and dry.  Neurological:     General: No focal deficit present.     Mental Status: She is alert.     Motor: Weakness present.  Psychiatric:        Attention and Perception: She is inattentive. She does not perceive auditory or visual hallucinations.        Speech: Speech is delayed.    Review of Systems  Constitutional:  Positive for malaise/fatigue.  Respiratory:  Negative for shortness of breath.   Cardiovascular:  Negative for chest pain.  Gastrointestinal:  Negative for abdominal pain and nausea.  Neurological:  Negative for focal weakness.  Psychiatric/Behavioral:  Negative for  hallucinations and suicidal ideas.    Blood pressure 128/79, pulse 91, temperature 98.7 F (37.1 C), temperature source Oral, resp. rate 20, height 6' (1.829 m), weight 89.2 kg, SpO2 100 %. Body mass index is 26.67 kg/m.   Disposition: No evidence of imminent risk to self or others at present.   Capacity evaluation complete. Psych will sign off. Please re-consult if needed.   This document was prepared using Dragon voice recognition software and may include unintentional dictation errors.  Bishop Limbo DNP, MBA, FNP-BC, PMHNP-BC Psychiatric Mental Health Nurse Practitioner [facility]

## 2022-12-17 DIAGNOSIS — M4646 Discitis, unspecified, lumbar region: Secondary | ICD-10-CM | POA: Diagnosis not present

## 2022-12-17 NOTE — Progress Notes (Signed)
Progress Note   [name] ZOX:[id number] DOB: 1950/08/02 DOA: 10/12/2022     72 DOS: the patient was seen and examined on 12/17/2022       Subjective:  Patient seen and examined at bedside this morning Patient has been seen by psychiatry and apparently she does not have capacity Denied nausea vomiting or chest pains Leg pain better Continues to await placement needs       Brief Narrative / Hospital Course:  Cynthia Dean is a 72 y.o. 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 She presents to the ED with worsening lower extremity pain. Admitted for possible Discitis/osteomyelitis at L4-L5: w/ suspected intradiscal abscess as per MRI. 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    4/18, HD access / fistula clotted off during dialysis. 4/19 pt taken for procedure to address fistula malfunction, but was agitated and combative, resfused procedure. 4/19 vascular placed R femoral temp HD cath placed, resumed HD and pemcath placed 4/24   Patient has had waxing and waning mental status with intermittently refusing dialysis. Patient's family have decided to continue hemodialysis after speaking with their PCP. Hospital course prolonged, complicated by patient's inability to tolerate sitting in recliner chair for dialysis.  She only agrees to dialysis in the bed.  This is a barrier to discharge as no outpatient dialysis centers can accommodate.       5/3: titrating up on fentanyl patch 12.5 --> 25 mcg, seems to be helping, if still unable to sit up in chair may consider increasing dose further    5/14- back pain seems to be well-controlled.  Caseworkers have discussed the case, will involve ethics committee/Hospital administration. Looking into home dialysis.    5/19 - d/w patient, she  states she doesn't want dialysis but cannot give a reason/explanation. I She voices she wants to live and that she needs dialysis to live. Lacks capacity to make informed decision. Continue defer to HCPOA.    5/21 - awaiting home visit to see if candidate for home dialysis      Consultants:  Neurosurgery Infectious Disease Vascular surgery  Nephrology    Procedures: 09/22/22: Stent placement to the AV graft  10/13/22: CT aspiration of right psoas muscle abscess yielding 2 ml purulent material  11/11/22: R Femoral V catheter placement 11/16/22: Permcath placement to L IJ            ASSESSMENT & PLAN:   Principal Problem:   Back pain Active Problems:   Anemia   Bilateral leg pain   ESRD on dialysis [facility])   Essential hypertension   Dyslipidemia   Seizure disorder (HCC)   Diabetic retinopathy without macular edema associated with type 2 diabetes mellitus (HCC)   GERD (gastroesophageal reflux disease)   Diabetic neuropathy (HCC)   Anemia of chronic disease   Insulin-requiring or dependent type II diabetes mellitus (HCC)   Discitis of lumbar region   Myositis   Psoas abscess (HCC)     Lumbar discitis, 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   Wound cx grew staph capitis.   ID consulted.  Abx de-escalated to cefazolin.   Not a good surgical candidate as per neuro surg.   Pt having severe low  back and bilateral leg pain and has not been able to tolerate any position except laying nearly flat in the bed S/p cefazolin, for 6 weeks, End Date: 12/02/22 pain management per orders -- palliative care following peripherally 5/3: titrate up on fentanyl patch 12.5 --> 25 mcg - pt reports pain is improved, but still unable to tolerate sitting in chair for dialysis    Confusion and agitation - improved Hospital delirium  Pt was  agitated, and also not responding to questions or commands during early hospitalization.  Mental status did start to improve, and now pt is calm and interactive, however, at this time she has been having waxing and waning mental status Delirium precautions cont seroquel 50 mg nightly to help with delirium and sleep.   ESRD: on HD MWF.  Pt refused HD on 10/17/22, 10/18/22 and 3/27, 4/1.   When attempts made to do HD in chair, she did not tolerate on account of severe back pain Recommend consideration for home hemodialysis if no facility capable to do HD in the bed Seen by psychiatry team on 12/16/2022 and apparently patient lacks capacity Pt has voiced on occasion that she does not want dialysis and has been intermittently refusing this. Family encourages her to complete HD.      Generalized weakness / Physical Debility:  PT recs SNF. TOC following. Pt is bedridden and very high risk for developing pressure injuries of skin -- reposition frequently.   Monitor pressures areas on skin and offload as possible.   Severe back and bilateral leg pain due to severe multi-level lumbar degenerative disc disease. Pt has bulging discs at L2-3, L3-4, L4-5 and L5-S1. Pt will continue to have back and leg pain once antibiotics are complete. Pain mgmt per orders  Pain control complicated by AMS and hypotension with escalation of opiate dose - titrated up on fentanyl patch slowly and 25 mcg seems to be helpful, cautious use of IV fentanyl for breakthrough pain    ACD:  likely secondary ESRD. H&H are stable  Monitor periodic CBC   Hypokalemia Replace as needed Monitor BMP   Peripheral neuropathy --  Continue low dose gabapentin and monitor   Hx of seizures:  continue on home dose of Keppra   HLD:  continue on statin    Hx of COPD: w/o exacerbation.  Continue on bronchodilators    DM2: well controlled HbA1c 5.2.  d/c'ed BG checks    HTN:  Antihypertensives as blood pressure allows   GERD:   continue on PPI    Depression: severity unknown.  Continue on home dose of sertraline     Overweight: BMI 28.2.  Wt loss efforts recommended   Right foot pain  see peripheral neuropathy     DVT prophylaxis: heparin sq   Central lines / invasive devices: L IJ HD cath, port R chest    Code Status: DNR   Current Admission Status: inpatient    TOC needs / Dispo plan: SNF   Barriers to discharge / significant pending items: unable to sit to toelrate outpatient HD, working w/ PT, TOC involved     Family Communication: none at this time       Physical Exam Constitutional:      General: She is not in acute distress.    Appearance: Normal appearance. She is obese.  Cardiovascular:     Rate and Rhythm: Normal rate and regular rhythm.  Pulmonary:     Effort: Pulmonary effort is normal.     Breath  sounds: Normal breath sounds.  Abdominal:     General: Abdomen is flat.     Palpations: Abdomen is soft.  Neurological:     Mental Status: She is alert. Mental status is at baseline.  Psychiatric:        Behavior: Behavior normal.      Data Reviewed: I have reviewed the patient's most recent laboratory  Showing sodium 138 potassium 3.4 creatinine 5.46   Time spent: 40 minutes    Vitals:   12/16/22 1235 12/16/22 1623 12/17/22 0414 12/17/22 0809  BP: 128/68 124/70 (!) 121/93 124/69  Pulse: 94 91 99 98  Resp: 12 16 18 16   Temp: 97.6 F (36.4 C) 98.4 F (36.9 C) 98.7 F (37.1 C) 98.3 F (36.8 C)  TempSrc: Oral   Oral  SpO2: 98% 96% 99% 94%  Weight: 85.4 kg     Height:         Author: Loyce Dys, MD 12/17/2022 2:19 PM  For on call review [email or web address].

## 2022-12-18 DIAGNOSIS — M4646 Discitis, unspecified, lumbar region: Secondary | ICD-10-CM | POA: Diagnosis not present

## 2022-12-18 LAB — BASIC METABOLIC PANEL
Anion gap: 8 (ref 5–15)
BUN: 29 mg/dL — ABNORMAL HIGH (ref 8–23)
CO2: 29 mmol/L (ref 22–32)
Calcium: 11.7 mg/dL — ABNORMAL HIGH (ref 8.9–10.3)
Chloride: 102 mmol/L (ref 98–111)
Creatinine, Ser: 6.06 mg/dL — ABNORMAL HIGH (ref 0.44–1.00)
GFR, Estimated: 7 mL/min — ABNORMAL LOW (ref >60)
Glucose, Bld: 126 mg/dL — ABNORMAL HIGH (ref 70–99)
Potassium: 4.3 mmol/L (ref 3.5–5.1)
Sodium: 139 mmol/L (ref 135–145)

## 2022-12-18 NOTE — Progress Notes (Signed)
Progress Note   [name] AOZ:[id number] DOB: 05/25/1951 DOA: 10/12/2022     67 DOS: the patient was seen and examined on 12/18/2022     Subjective:  Patient seen and examined at bedside this morning Denied nausea vomiting or chest pains I called the patient's daughter however unable to answer so I left a voice message Continues to await placement needs     Brief Narrative / Hospital Course:  Cynthia Dean is a 72 y.o. 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 She presents to the ED with worsening lower extremity pain. Admitted for possible Discitis/osteomyelitis at L4-L5: w/ suspected intradiscal abscess as per MRI. 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    4/18, HD access / fistula clotted off during dialysis. 4/19 pt taken for procedure to address fistula malfunction, but was agitated and combative, resfused procedure. 4/19 vascular placed R femoral temp HD cath placed, resumed HD and pemcath placed 4/24   Patient has had waxing and waning mental status with intermittently refusing dialysis. Patient's family have decided to continue hemodialysis after speaking with their PCP. Hospital course prolonged, complicated by patient's inability to tolerate sitting in recliner chair for dialysis.  She only agrees to dialysis in the bed.  This is a barrier to discharge as no outpatient dialysis centers can accommodate.       5/3: titrating up on fentanyl patch 12.5 --> 25 mcg, seems to be helping, if still unable to sit up in chair may consider increasing dose further    5/14- back pain seems to be well-controlled.  Caseworkers have discussed the case, will involve ethics committee/Hospital administration. Looking into home dialysis.    5/19 - d/w patient, she states she doesn't want  dialysis but cannot give a reason/explanation. I She voices she wants to live and that she needs dialysis to live. Lacks capacity to make informed decision. Continue defer to HCPOA.    5/21 - awaiting home visit to see if candidate for home dialysis      Consultants:  Neurosurgery Infectious Disease Vascular surgery  Nephrology    Procedures: 09/22/22: Stent placement to the AV graft  10/13/22: CT aspiration of right psoas muscle abscess yielding 2 ml purulent material  11/11/22: R Femoral V catheter placement 11/16/22: Permcath placement to L IJ            ASSESSMENT & PLAN:   Principal Problem:   Back pain Active Problems:   Anemia   Bilateral leg pain   ESRD on dialysis [facility])   Essential hypertension   Dyslipidemia   Seizure disorder (HCC)   Diabetic retinopathy without macular edema associated with type 2 diabetes mellitus (HCC)   GERD (gastroesophageal reflux disease)   Diabetic neuropathy (HCC)   Anemia of chronic disease   Insulin-requiring or dependent type II diabetes mellitus (HCC)   Discitis of lumbar region   Myositis   Psoas abscess (HCC)     Lumbar discitis, 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   Wound cx grew staph capitis.   ID consulted.  Abx de-escalated to cefazolin.   Not a good surgical candidate as per neuro surg.   Pt having severe low back and bilateral leg pain  and has not been able to tolerate any position except laying nearly flat in the bed S/p cefazolin, for 6 weeks, End Date: 12/02/22 pain management per orders -- palliative care following peripherally 5/3: titrate up on fentanyl patch 12.5 --> 25 mcg - pt reports pain is improved, but still unable to tolerate sitting in chair for dialysis    Confusion and agitation - improved Hospital delirium  Pt was agitated, and also not  responding to questions or commands during early hospitalization.  Mental status did start to improve, and now pt is calm and interactive, however, at this time she has been having waxing and waning mental status Delirium precautions cont seroquel 50 mg nightly to help with delirium and sleep.   ESRD: on HD MWF.  Pt refused HD on 10/17/22, 10/18/22 and 3/27, 4/1.   When attempts made to do HD in chair, she did not tolerate on account of severe back pain Recommend consideration for home hemodialysis if no facility capable to do HD in the bed Seen by psychiatry team on 12/16/2022 and apparently patient lacks capacity Currently decision being made by patient's daughter Cynthia Dean     Generalized weakness / Physical Debility:  PT recs SNF. TOC following. Pt is bedridden and very high risk for developing pressure injuries of skin -- reposition frequently.   Monitor pressures areas on skin and offload as possible.   Severe back and bilateral leg pain due to severe multi-level lumbar degenerative disc disease. Pt has bulging discs at L2-3, L3-4, L4-5 and L5-S1. Pt will continue to have back and leg pain once antibiotics are complete. Pain mgmt per orders  Pain control complicated by AMS and hypotension with escalation of opiate dose - titrated up on fentanyl patch slowly and 25 mcg seems to be helpful, cautious use of IV fentanyl for breakthrough pain    ACD:  likely secondary ESRD. H&H are stable  Monitor periodic CBC   Hypokalemia Replace as needed Monitor BMP   Peripheral neuropathy --  Continue low dose gabapentin and monitor   Hx of seizures:  continue on home dose of Keppra   HLD:  continue on statin    Hx of COPD: w/o exacerbation.  Continue on bronchodilators    DM2: well controlled HbA1c 5.2.  d/c'ed BG checks    HTN:  Antihypertensives as blood pressure allows   GERD:  continue on PPI    Depression: severity unknown.  Continue on home dose of sertraline      Overweight: BMI 28.2.  Wt loss efforts recommended   Right foot pain  see peripheral neuropathy     DVT prophylaxis: heparin sq   Central lines / invasive devices: L IJ HD cath, port R chest    Code Status: DNR   Current Admission Status: inpatient    TOC needs / Dispo plan: SNF   Barriers to discharge / significant pending items: unable to sit to toelrate outpatient HD, working w/ PT, TOC involved     Family Communication: none at this time       Physical Exam Constitutional:      General: She is not in acute distress.    Appearance: Normal appearance. She is obese.  Cardiovascular:     Rate and Rhythm: Normal rate and regular rhythm.  Pulmonary:     Effort: Pulmonary effort is normal.     Breath sounds: Normal breath sounds.  Abdominal:     General: Abdomen is flat.     Palpations: Abdomen is  soft.  Neurological:     Mental Status: She is alert. Mental status is at baseline.  Psychiatric:        Behavior: Behavior normal.      Data Reviewed: I have reviewed the patient's most recent laboratory  Showing sodium 139 potassium 4.3 creatinine 11.7   Time spent: 42 minutes    Vitals:   12/18/22 0506 12/18/22 0754 12/18/22 0825 12/18/22 1551  BP: 133/77 (!) 94/32 128/74 112/75  Pulse: 97 92  90  Resp: 16 18  16   Temp: 98.4 F (36.9 C) 98 F (36.7 C)  98.2 F (36.8 C)  TempSrc:    Oral  SpO2: 93% 96%  95%  Weight:      Height:         Author: Loyce Dys, MD 12/18/2022 4:09 PM  For on call review [email or web address].

## 2022-12-19 DIAGNOSIS — M4646 Discitis, unspecified, lumbar region: Secondary | ICD-10-CM | POA: Diagnosis not present

## 2022-12-19 LAB — BASIC METABOLIC PANEL WITH GFR
Anion gap: 9 (ref 5–15)
BUN: 37 mg/dL — ABNORMAL HIGH (ref 8–23)
CO2: 28 mmol/L (ref 22–32)
Calcium: 11.9 mg/dL — ABNORMAL HIGH (ref 8.9–10.3)
Chloride: 100 mmol/L (ref 98–111)
Creatinine, Ser: 7.13 mg/dL — ABNORMAL HIGH (ref 0.44–1.00)
GFR, Estimated: 6 mL/min — ABNORMAL LOW
Glucose, Bld: 101 mg/dL — ABNORMAL HIGH (ref 70–99)
Potassium: 4.2 mmol/L (ref 3.5–5.1)
Sodium: 137 mmol/L (ref 135–145)

## 2022-12-19 LAB — CBC WITH DIFFERENTIAL/PLATELET
Abs Immature Granulocytes: 0.04 K/uL (ref 0.00–0.07)
Basophils Absolute: 0.1 K/uL (ref 0.0–0.1)
Basophils Relative: 1 %
Eosinophils Absolute: 0.1 K/uL (ref 0.0–0.5)
Eosinophils Relative: 2 %
HCT: 31.2 % — ABNORMAL LOW (ref 36.0–46.0)
Hemoglobin: 9.2 g/dL — ABNORMAL LOW (ref 12.0–15.0)
Immature Granulocytes: 1 %
Lymphocytes Relative: 32 %
Lymphs Abs: 2.2 K/uL (ref 0.7–4.0)
MCH: 25.6 pg — ABNORMAL LOW (ref 26.0–34.0)
MCHC: 29.5 g/dL — ABNORMAL LOW (ref 30.0–36.0)
MCV: 86.9 fL (ref 80.0–100.0)
Monocytes Absolute: 0.6 K/uL (ref 0.1–1.0)
Monocytes Relative: 9 %
Neutro Abs: 3.7 K/uL (ref 1.7–7.7)
Neutrophils Relative %: 55 %
Platelets: 199 K/uL (ref 150–400)
RBC: 3.59 MIL/uL — ABNORMAL LOW (ref 3.87–5.11)
RDW: 19 % — ABNORMAL HIGH (ref 11.5–15.5)
WBC: 6.7 K/uL (ref 4.0–10.5)
nRBC: 0 % (ref 0.0–0.2)

## 2022-12-19 MED ORDER — ALTEPLASE 2 MG IJ SOLR
2.0000 mg | Freq: Once | INTRAMUSCULAR | Status: AC
  Administered 2022-12-19: 2 mg
  Filled 2022-12-19: qty 2

## 2022-12-19 MED ORDER — EPOETIN ALFA 10000 UNIT/ML IJ SOLN
INTRAMUSCULAR | Status: AC
  Filled 2022-12-19: qty 1

## 2022-12-19 MED ORDER — STERILE WATER FOR INJECTION IJ SOLN
INTRAMUSCULAR | Status: AC
  Filled 2022-12-19: qty 10

## 2022-12-19 MED ORDER — HEPARIN SODIUM (PORCINE) 1000 UNIT/ML IJ SOLN
INTRAMUSCULAR | Status: AC
  Filled 2022-12-19: qty 10

## 2022-12-19 MED ORDER — SODIUM CHLORIDE 0.9% FLUSH
10.0000 mL | Freq: Two times a day (BID) | INTRAVENOUS | Status: DC
  Administered 2022-12-19: 10 mL
  Administered 2022-12-20: 20 mL
  Administered 2022-12-20 – 2022-12-21 (×2): 10 mL
  Administered 2022-12-21: 20 mL
  Administered 2022-12-22 – 2022-12-25 (×7): 10 mL
  Administered 2022-12-26: 20 mL
  Administered 2022-12-26 – 2022-12-28 (×4): 10 mL
  Administered 2022-12-28: 20 mL
  Administered 2022-12-29: 10 mL
  Administered 2022-12-29: 20 mL
  Administered 2022-12-30: 10 mL
  Administered 2022-12-30: 20 mL
  Administered 2022-12-31 – 2023-01-01 (×4): 10 mL
  Administered 2023-01-02: 20 mL
  Administered 2023-01-02 – 2023-01-03 (×2): 10 mL
  Administered 2023-01-03: 20 mL
  Administered 2023-01-04 – 2023-01-13 (×15): 10 mL

## 2022-12-19 MED ORDER — SODIUM CHLORIDE 0.9% FLUSH
10.0000 mL | INTRAVENOUS | Status: DC | PRN
  Administered 2022-12-19: 10 mL

## 2022-12-19 NOTE — Progress Notes (Signed)
Progress Note   [name] ZOX:[id number] DOB: 12-Nov-1950 DOA: 10/12/2022     68 DOS: the patient was seen and examined on 12/19/2022    Subjective:  Patient seen and examined at bedside this morning Denied nausea vomiting or chest pains Again today I called the patient's daughter however unable to answer so I left a voice message Continues to await placement needs     Brief Narrative / Hospital Course:  Cynthia Dean is a 72 y.o. 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 She presents to the ED with worsening lower extremity pain. Admitted for possible Discitis/osteomyelitis at L4-L5: w/ suspected intradiscal abscess as per MRI. 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    4/18, HD access / fistula clotted off during dialysis. 4/19 pt taken for procedure to address fistula malfunction, but was agitated and combative, resfused procedure. 4/19 vascular placed R femoral temp HD cath placed, resumed HD and pemcath placed 4/24   Patient has had waxing and waning mental status with intermittently refusing dialysis. Patient's family have decided to continue hemodialysis after speaking with their PCP. Hospital course prolonged, complicated by patient's inability to tolerate sitting in recliner chair for dialysis.  She only agrees to dialysis in the bed.  This is a barrier to discharge as no outpatient dialysis centers can accommodate.       5/3: titrating up on fentanyl patch 12.5 --> 25 mcg, seems to be helping, if still unable to sit up in chair may consider increasing dose further    5/14- back pain seems to be well-controlled.  Caseworkers have discussed the case, will involve ethics committee/Hospital administration. Looking into home dialysis.    5/19 - d/w patient, she states she  doesn't want dialysis but cannot give a reason/explanation. I She voices she wants to live and that she needs dialysis to live. Lacks capacity to make informed decision. Continue defer to HCPOA.    5/21 - awaiting home visit to see if candidate for home dialysis      Consultants:  Neurosurgery Infectious Disease Vascular surgery  Nephrology    Procedures: 09/22/22: Stent placement to the AV graft  10/13/22: CT aspiration of right psoas muscle abscess yielding 2 ml purulent material  11/11/22: R Femoral V catheter placement 11/16/22: Permcath placement to L IJ            ASSESSMENT & PLAN:   Principal Problem:   Back pain Active Problems:   Anemia   Bilateral leg pain   ESRD on dialysis [facility])   Essential hypertension   Dyslipidemia   Seizure disorder (HCC)   Diabetic retinopathy without macular edema associated with type 2 diabetes mellitus (HCC)   GERD (gastroesophageal reflux disease)   Diabetic neuropathy (HCC)   Anemia of chronic disease   Insulin-requiring or dependent type II diabetes mellitus (HCC)   Discitis of lumbar region   Myositis   Psoas abscess (HCC)     Lumbar discitis, 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   Wound cx grew staph capitis.   ID consulted.  Abx de-escalated to cefazolin.   Not a good surgical candidate as per neuro surg.   Pt having severe low back and bilateral leg  pain and has not been able to tolerate any position except laying nearly flat in the bed S/p cefazolin, for 6 weeks, End Date: 12/02/22 pain management per orders -- palliative care following peripherally 5/3: titrate up on fentanyl patch 12.5 --> 25 mcg - pt reports pain is improved, but still unable to tolerate sitting in chair for dialysis    Confusion and agitation - improved Hospital delirium  Pt was agitated,  and also not responding to questions or commands during early hospitalization.  Mental status did start to improve, and now pt is calm and interactive, however, at this time she has been having waxing and waning mental status Delirium precautions cont seroquel 50 mg nightly to help with delirium and sleep.   ESRD: on HD MWF.  Pt refused HD on 10/17/22, 10/18/22 and 3/27, 4/1.   When attempts made to do HD in chair, she did not tolerate on account of severe back pain Recommend consideration for home hemodialysis if no facility capable to do HD in the bed Seen by psychiatry team on 12/16/2022 and apparently patient lacks capacity Currently decision being made by patient's daughter Cynthia Dean     Generalized weakness / Physical Debility:  PT recs SNF. TOC following. Pt is bedridden and very high risk for developing pressure injuries of skin -- reposition frequently.   Monitor pressures areas on skin and offload as possible.   Severe back and bilateral leg pain due to severe multi-level lumbar degenerative disc disease. Pt has bulging discs at L2-3, L3-4, L4-5 and L5-S1. Pt will continue to have back and leg pain once antibiotics are complete. Pain mgmt per orders  Pain control complicated by AMS and hypotension with escalation of opiate dose - titrated up on fentanyl patch slowly and 25 mcg seems to be helpful, cautious use of IV fentanyl for breakthrough pain    ACD:  likely secondary ESRD. H&H are stable  Monitor periodic CBC   Hypokalemia Replace as needed Monitor BMP   Peripheral neuropathy --  Continue low dose gabapentin and monitor   Hx of seizures:  continue on home dose of Keppra   HLD:  continue on statin    Hx of COPD: w/o exacerbation.  Continue on bronchodilators    DM2: well controlled HbA1c 5.2.  d/c'ed BG checks    HTN:  Antihypertensives as blood pressure allows   GERD:  continue on PPI    Depression: severity unknown.  Continue on home dose of  sertraline     Overweight: BMI 28.2.  Wt loss efforts recommended   Right foot pain  see peripheral neuropathy     DVT prophylaxis: heparin sq   Central lines / invasive devices: L IJ HD cath, port R chest    Code Status: DNR   Current Admission Status: inpatient    TOC needs / Dispo plan: SNF   Barriers to discharge / significant pending items: unable to sit to toelrate outpatient HD, working w/ PT, TOC involved     Family Communication: Throughout this week I have made several attempts to reach patient's daughter.  Daughter called again today but unable to answer I have left a voice message.      Physical Exam Constitutional:      General: She is not in acute distress.    Appearance: Normal appearance. She is obese.  Cardiovascular:     Rate and Rhythm: Normal rate and regular rhythm.  Pulmonary:     Effort: Pulmonary effort is normal.  Breath sounds: Normal breath sounds.  Abdominal:     General: Abdomen is flat.     Palpations: Abdomen is soft.  Neurological:     Mental Status: She is alert. Mental status is at baseline.  Psychiatric:        Behavior: Behavior normal.      Data Reviewed: I have reviewed the patient's most recent laboratory  Showing sodium 137 potassium 4.2 creatinine 7.1 WBC 6.7 hemoglobin 9.2   Time spent: 42 minutes      Vitals:   12/19/22 1230 12/19/22 1237 12/19/22 1300 12/19/22 1323  BP: 118/77 118/77 96/70 117/62  Pulse: 92 92 89 82  Resp: 19 20 17 14   Temp:    98.3 F (36.8 C)  TempSrc:    Oral  SpO2: 100% 100% 100% 99%  Weight:    86 kg  Height:         Author: Loyce Dys, MD 12/19/2022 3:59 PM  For on call review [email or web address].

## 2022-12-19 NOTE — TOC Progression Note (Signed)
Transition of Care [facility]) - Progression Note    Patient Details  Name: Cynthia Dean MRN: [id number] Date of Birth: [birth year]  Transition of Care [facility]) CM/SW Contact  Allena Katz, LCSW Phone Number: 12/19/2022, 8:52 AM  Clinical Narrative:    Per psych eval, pt does not have decision making capacity at this time. Ethics committed updated.        Expected Discharge Plan and Services                                               Social Determinants of Health (SDOH) Interventions SDOH Screenings   Food Insecurity: No Food Insecurity (10/12/2022)  Housing: Low Risk  (10/12/2022)  Transportation Needs: No Transportation Needs (10/12/2022)  Utilities: Not At Risk (10/12/2022)  Financial Resource Strain: Low Risk  (11/12/2017)  Physical Activity: Insufficiently Active (11/12/2017)  Social Connections: Moderately Integrated (11/12/2017)  Stress: No Stress Concern Present (11/12/2017)  Tobacco Use: Medium Risk (11/16/2022)    Readmission Risk Interventions     No data to display

## 2022-12-19 NOTE — Progress Notes (Signed)
VAST consult received this morning to re-access port (port was de-accessed by unit RN after no blood return could be obtained). VAST RN accessed port and was unable to obtain blood return. Alteplase was ordered. This VAST RN instilled Alteplase and allowed it to dwell for 4 hours total (attempted to withdraw without success after 2 hours and allowed to dwell 2 more hours). Port flushed well, but unable to obtain blood return. Pt in dialysis at time of assessment for blood return. Notified unit RN of findings. Advised that MD can order a 2nd dose of alteplase; if ordered unit RN to place IV consult for declotting.

## 2022-12-19 NOTE — Progress Notes (Signed)
[facility]  PROGRESS NOTE   Subjective:   Patient seen and evaluated during dialysis   HEMODIALYSIS FLOWSHEET:  Blood Flow Rate (mL/min): 300 mL/min Arterial Pressure (mmHg): -10 mmHg Venous Pressure (mmHg): 200 mmHg TMP (mmHg): 15 mmHg Ultrafiltration Rate (mL/min): 575 mL/min Dialysate Flow Rate (mL/min): 300 ml/min Dialysis Fluid Bolus: Normal Saline Bolus Amount (mL): 500 mL  Agreeable to treatment today   Objective:  Vital signs: Blood pressure 139/68, pulse 83, temperature 97.8 F (36.6 C), temperature source Oral, resp. rate 13, height 6' (1.829 m), weight 85.8 kg, SpO2 100 %.  Intake/Output Summary (Last 24 hours) at 12/19/2022 1145 Last data filed at 12/18/2022 1838 Gross per 24 hour  Intake 840 ml  Output --  Net 840 ml    Filed Weights   12/14/22 1717 12/16/22 1235 12/19/22 0919  Weight: 89.2 kg 85.4 kg 85.8 kg     Physical Exam: General:  No acute distress, laying in bed  Head:  Normocephalic, atraumatic. Moist oral mucosal membranes  Eyes:  Anicteric  Lungs:   Clear to auscultation, normal effort  Heart:  Irregular  Abdomen:   Soft, nontender, bowel sounds present  Extremities:  No peripheral edema.  Neurologic:  Alert and oriented to self and place  Skin:  No lesions  Access:  [facility] permcath    Basic Metabolic Panel: Recent Labs  Lab 12/13/22 0926 12/14/22 1337 12/16/22 0852 12/18/22 1103 12/19/22 0931  NA 138 137 138 139 137  K 3.7 3.9 3.4* 4.3 4.2  CL 98 102 100 102 100  CO2 28 26 26 29 28   GLUCOSE 111* 138* 141* 126* 101*  BUN 47* 25* 24* 29* 37*  CREATININE 8.98* 6.08* 5.46* 6.06* 7.13*  CALCIUM 10.5* 10.4* 10.4* 11.7* 11.9*  PHOS 6.2* 4.5 4.0  --   --     GFR: Estimated Creatinine Clearance: 8.4 mL/min (A) (by C-G formula based on SCr of 7.13 mg/dL (H)).  Liver Function Tests: Recent Labs  Lab 12/13/22 0926 12/14/22 1337 12/16/22 0852  ALBUMIN 2.4* 2.6* 2.7*    No results for input(s): "LIPASE",  "AMYLASE" in the last 168 hours. No results for input(s): "AMMONIA" in the last 168 hours.  CBC: Recent Labs  Lab 12/13/22 0926 12/14/22 1337 12/16/22 0852 12/19/22 0450  WBC 6.9 7.1 8.2 6.7  NEUTROABS  --   --   --  3.7  HGB 8.8* 9.0* 9.3* 9.2*  HCT 30.0* 30.9* 31.4* 31.2*  MCV 85.7 86.1 87.5 86.9  PLT 232 210 210 199      HbA1C: Hemoglobin A1C  Date/Time Value Ref Range Status  06/15/2014 12:58 AM 7.9 (H) 4.2 - 6.3 % Final    Comment:    The American Diabetes Association recommends that a primary goal of therapy should be <7% and that physicians should reevaluate the treatment regimen in patients with HbA1c values consistently >8%.   12/24/2013 11:18 AM 8.2 (H) 4.2 - 6.3 % Final    Comment:    The American Diabetes Association recommends that a primary goal of therapy should be <7% and that physicians should reevaluate the treatment regimen in patients with HbA1c values consistently >8%.    Hgb A1c MFr Bld  Date/Time Value Ref Range Status  10/13/2022 11:46 AM 5.2 4.8 - 5.6 % Final    Comment:    (NOTE)         Prediabetes: 5.7 - 6.4         Diabetes: >6.4  Glycemic control for adults with diabetes: <7.0   03/23/2022 06:47 AM 5.8 (H) 4.8 - 5.6 % Final    Comment:    (NOTE) Pre diabetes:          5.7%-6.4%  Diabetes:              >6.4%  Glycemic control for   <7.0% adults with diabetes     Urinalysis: No results for input(s): "COLORURINE", "LABSPEC", "PHURINE", "GLUCOSEU", "HGBUR", "BILIRUBINUR", "KETONESUR", "PROTEINUR", "UROBILINOGEN", "NITRITE", "LEUKOCYTESUR" in the last 72 hours.  Invalid input(s): "APPERANCEUR"    Imaging: No results found.   Medications:    sodium chloride Stopped (11/16/22 1155)   anticoagulant sodium citrate      (feeding supplement) PROSource Plus  30 mL Oral BID BM   cephALEXin  500 mg Oral QHS   Chlorhexidine Gluconate Cloth  6 each Topical Q0600   cinacalcet  60 mg Oral Q supper   epoetin  (EPOGEN/PROCRIT) injection  10,000 Units Intravenous Q M,W,F-HD   feeding supplement (NEPRO CARB STEADY)  237 mL Oral TID BM   fentaNYL  1 patch Transdermal Q72H   gabapentin  100 mg Oral Once per day on Mon Wed Fri   heparin injection (subcutaneous)  5,000 Units Subcutaneous Q8H   levETIRAcetam  1,000 mg Oral Daily   levETIRAcetam  250 mg Oral Q M,W,F   multivitamin  1 tablet Oral QHS   pantoprazole  20 mg Oral BID   psyllium  1 packet Oral Daily   QUEtiapine  25 mg Oral QHS   sertraline  25 mg Oral Daily   sevelamer carbonate  2.4 g Oral TID WC   sodium chloride flush  10-40 mL Intracatheter Q12H   sodium chloride flush  10-40 mL Intracatheter Q12H   sterile water (preservative free)       umeclidinium-vilanterol  1 puff Inhalation Daily    Assessment/ Plan:     [name] is a 72 y.o. female with end stage renal disease on hemodialysis, CAD, COPD with intermittent home O2, hypertension, hyperlipidemia, stroke, diabetes who has been admitted since 3/20. Initially admitted for vertebral osteomyelitis. She has completed IV antibiotics. Now is working on placement.   Due to extended hospitalization, patient will need outpatient dialysis clinic re-initiated.   [facility] TTS [facility] permcath   #1 ESRD:  -Receiving dialysis today, UF 0.5-1L as tolerated. Next treatment scheduled for Wednesday.    #2: Anemia with chronic kidney disease: hemoglobin 9.2 - Continue EPO with dialysis.    #3: Second hyperparathyroidism: With hypercalcemia Lab Results  Component Value Date   PTH 157 (H) 12/08/2022   CALCIUM 11.9 (H) 12/19/2022   CAION 1.09 (L) 08/28/2022   PHOS 4.0 12/16/2022    -  phosphorus within desired range -Noted to have hypercalcemia-may be due to prolonged immobilization. -Continue Cinacalcet to 60 mg daily. - Continue sevelamer with meals.    #4: Hypertension Not currently on any blood pressure agents. Blood pressure soft, 85/44 during  dialysis.    LOS: [address] [facility] 5/27/202411:45 AM

## 2022-12-19 NOTE — Progress Notes (Signed)
Hemodialysis note  Received patient in bed to unit. Alert and oriented.  Informed consent signed and in chart.  Treatment initiated: 0928 Treatment completed: 1323  Patient tolerated well. Transported back to room, alert without acute distress.  Report given to patient's RN.   Access used: Right Chest HD PermCath Access issues: High Arterial pressure. Lines were switched during HD.  Total UF removed: 500 ml Medication(s) given:  Epogen 10 000 units and Phenol mouth spray Post HD weight: 86 kg   [facility]

## 2022-12-20 DIAGNOSIS — M4646 Discitis, unspecified, lumbar region: Secondary | ICD-10-CM | POA: Diagnosis not present

## 2022-12-20 LAB — CBC WITH DIFFERENTIAL/PLATELET
Abs Immature Granulocytes: 0.03 10*3/uL (ref 0.00–0.07)
Basophils Absolute: 0.1 10*3/uL (ref 0.0–0.1)
Basophils Relative: 1 %
Eosinophils Absolute: 0.1 10*3/uL (ref 0.0–0.5)
Eosinophils Relative: 2 %
HCT: 34.8 % — ABNORMAL LOW (ref 36.0–46.0)
Hemoglobin: 10.3 g/dL — ABNORMAL LOW (ref 12.0–15.0)
Immature Granulocytes: 0 %
Lymphocytes Relative: 31 %
Lymphs Abs: 2.5 10*3/uL (ref 0.7–4.0)
MCH: 25.4 pg — ABNORMAL LOW (ref 26.0–34.0)
MCHC: 29.6 g/dL — ABNORMAL LOW (ref 30.0–36.0)
MCV: 85.9 fL (ref 80.0–100.0)
Monocytes Absolute: 0.8 10*3/uL (ref 0.1–1.0)
Monocytes Relative: 11 %
Neutro Abs: 4.4 10*3/uL (ref 1.7–7.7)
Neutrophils Relative %: 55 %
Platelets: 185 10*3/uL (ref 150–400)
RBC: 4.05 MIL/uL (ref 3.87–5.11)
RDW: 18.8 % — ABNORMAL HIGH (ref 11.5–15.5)
WBC: 7.9 10*3/uL (ref 4.0–10.5)
nRBC: 0 % (ref 0.0–0.2)

## 2022-12-20 LAB — BASIC METABOLIC PANEL
Anion gap: 10 (ref 5–15)
BUN: 21 mg/dL (ref 8–23)
CO2: 29 mmol/L (ref 22–32)
Calcium: 11 mg/dL — ABNORMAL HIGH (ref 8.9–10.3)
Chloride: 97 mmol/L — ABNORMAL LOW (ref 98–111)
Creatinine, Ser: 4.79 mg/dL — ABNORMAL HIGH (ref 0.44–1.00)
GFR, Estimated: 9 mL/min — ABNORMAL LOW (ref >60)
Glucose, Bld: 91 mg/dL (ref 70–99)
Potassium: 3.6 mmol/L (ref 3.5–5.1)
Sodium: 136 mmol/L (ref 135–145)

## 2022-12-20 NOTE — Progress Notes (Signed)
Progress Note   [name] GNF:[id number] DOB: Nov 27, 1950 DOA: 10/12/2022     69 DOS: the patient was seen and examined on 12/20/2022     Subjective:  Patient seen and examined at bedside this morning Denied nausea vomiting or chest pains We are still having difficulty reaching patient's daughter despite multiple phone calls and voice messages Continues to await placement needs/home visit for Home hemodialysis     Brief Narrative / Hospital Course:  Cynthia Dean is a 72 y.o. 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 She presents to the ED with worsening lower extremity pain. Admitted for possible Discitis/osteomyelitis at L4-L5: w/ suspected intradiscal abscess as per MRI. 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    4/18, HD access / fistula clotted off during dialysis. 4/19 pt taken for procedure to address fistula malfunction, but was agitated and combative, resfused procedure. 4/19 vascular placed R femoral temp HD cath placed, resumed HD and pemcath placed 4/24   Patient has had waxing and waning mental status with intermittently refusing dialysis. Patient's family have decided to continue hemodialysis after speaking with their PCP. Hospital course prolonged, complicated by patient's inability to tolerate sitting in recliner chair for dialysis.  She only agrees to dialysis in the bed.  This is a barrier to discharge as no outpatient dialysis centers can accommodate.       5/3: titrating up on fentanyl patch 12.5 --> 25 mcg, seems to be helping, if still unable to sit up in chair may consider increasing dose further    5/14- back pain seems to be well-controlled.  Caseworkers have discussed the case, will involve ethics committee/Hospital administration. Looking into home  dialysis.    5/19 - d/w patient, she states she doesn't want dialysis but cannot give a reason/explanation. I She voices she wants to live and that she needs dialysis to live. Lacks capacity to make informed decision. Continue defer to HCPOA.    5/21 - awaiting home visit to see if candidate for home dialysis   5/22 to 5/28-patient's family have been unreachable to discuss home visit     Consultants:  Neurosurgery Infectious Disease Vascular surgery  Nephrology    Procedures: 09/22/22: Stent placement to the AV graft  10/13/22: CT aspiration of right psoas muscle abscess yielding 2 ml purulent material  11/11/22: R Femoral V catheter placement 11/16/22: Permcath placement to L IJ            ASSESSMENT & PLAN:   Principal Problem:   Back pain Active Problems:   Anemia   Bilateral leg pain   ESRD on dialysis [facility])   Essential hypertension   Dyslipidemia   Seizure disorder (HCC)   Diabetic retinopathy without macular edema associated with type 2 diabetes mellitus (HCC)   GERD (gastroesophageal reflux disease)   Diabetic neuropathy (HCC)   Anemia of chronic disease   Insulin-requiring or dependent type II diabetes mellitus (HCC)   Discitis of lumbar region   Myositis   Psoas abscess (HCC)     Lumbar discitis, 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   Wound cx grew staph capitis.   ID consulted.  Abx de-escalated to cefazolin.   Not a good surgical  candidate as per neuro surg.   Pt having severe low back and bilateral leg pain and has not been able to tolerate any position except laying nearly flat in the bed S/p cefazolin, for 6 weeks, End Date: 12/02/22 pain management per orders -- palliative care following peripherally 5/3: titrate up on fentanyl patch 12.5 --> 25 mcg - pt reports pain is improved, but still  unable to tolerate sitting in chair for dialysis    Confusion and agitation - improved Hospital delirium  Pt was agitated, and also not responding to questions or commands during early hospitalization.  Mental status did start to improve, and now pt is calm and interactive, however, at this time she has been having waxing and waning mental status Delirium precautions cont seroquel 50 mg nightly to help with delirium and sleep.   ESRD: on HD MWF.  Pt refused HD on 10/17/22, 10/18/22 and 3/27, 4/1.   When attempts made to do HD in chair, she did not tolerate on account of severe back pain Recommend consideration for home hemodialysis if no facility capable to do HD in the bed Seen by psychiatry team on 12/16/2022 and apparently patient lacks capacity Currently decision being made by patient's daughter Cynthia Dean     Generalized weakness / Physical Debility:  PT recs SNF. TOC following. Pt is bedridden and very high risk for developing pressure injuries of skin -- reposition frequently.   Monitor pressures areas on skin and offload as possible.   Severe back and bilateral leg pain due to severe multi-level lumbar degenerative disc disease. Pt has bulging discs at L2-3, L3-4, L4-5 and L5-S1. Pt will continue to have back and leg pain once antibiotics are complete. Pain mgmt per orders  Pain control complicated by AMS and hypotension with escalation of opiate dose - titrated up on fentanyl patch slowly and 25 mcg seems to be helpful, cautious use of IV fentanyl for breakthrough pain    ACD:  likely secondary ESRD. H&H are stable  Monitor periodic CBC   Hypokalemia Replace as needed Monitor BMP   Peripheral neuropathy --  Continue low dose gabapentin and monitor   Hx of seizures:  continue on home dose of Keppra   HLD:  continue on statin    Hx of COPD: w/o exacerbation.  Continue on bronchodilators    DM2: well controlled HbA1c 5.2.  d/c'ed BG checks    HTN:   Antihypertensives as blood pressure allows   GERD:  continue on PPI    Depression: severity unknown.  Continue on home dose of sertraline     Overweight: BMI 28.2.  Wt loss efforts recommended   Right foot pain  see peripheral neuropathy     DVT prophylaxis: heparin sq   Central lines / invasive devices: L IJ HD cath, port R chest    Code Status: DNR   Current Admission Status: inpatient    TOC needs / Dispo plan: SNF   Barriers to discharge / significant pending items: unable to sit to toelrate outpatient HD, working w/ PT, TOC involved     Family Communication: Throughout this week I have made several attempts to reach patient's daughter.  Daughter called again today but unable to answer I have left a voice message.      Physical Exam Constitutional:      General: She is not in acute distress.    Appearance: Normal appearance. She is obese.  Cardiovascular:     Rate and Rhythm: Normal rate and regular  rhythm.  Pulmonary:     Effort: Pulmonary effort is normal.     Breath sounds: Normal breath sounds.  Abdominal:     General: Abdomen is flat.     Palpations: Abdomen is soft.  Neurological:     Mental Status: She is alert. Mental status is at baseline.  Psychiatric:        Behavior: Behavior normal.      Data Reviewed: I have reviewed the patient's most recent laboratory  Showing sodium 136 potassium 3.6 creatinine 4.79 WBC 7.9 Hb 10.3   Time spent: 45 minutes      Vitals:   12/19/22 1616 12/19/22 2028 12/20/22 0406 12/20/22 0740  BP: 118/66 (!) 102/54 (!) 147/94 138/86  Pulse: 87 94 97 84  Resp: 16 16 18 18   Temp: 98 F (36.7 C) 97.9 F (36.6 C) 97.8 F (36.6 C) 98 F (36.7 C)  TempSrc:    Oral  SpO2: (!) 84% 95% 99% 98%  Weight:      Height:        Author: Loyce Dys, MD 12/20/2022 11:44 AM  For on call review [email or web address].

## 2022-12-20 NOTE — Discharge Planning (Signed)
Follow up call today to [facility]) to discuss Home Hemo option. No answer, left VM.

## 2022-12-21 DIAGNOSIS — I10 Essential (primary) hypertension: Secondary | ICD-10-CM | POA: Diagnosis not present

## 2022-12-21 DIAGNOSIS — M4646 Discitis, unspecified, lumbar region: Secondary | ICD-10-CM | POA: Diagnosis not present

## 2022-12-21 DIAGNOSIS — Z992 Dependence on renal dialysis: Secondary | ICD-10-CM | POA: Diagnosis not present

## 2022-12-21 DIAGNOSIS — N186 End stage renal disease: Secondary | ICD-10-CM | POA: Diagnosis not present

## 2022-12-21 LAB — CBC WITH DIFFERENTIAL/PLATELET
Abs Immature Granulocytes: 0.03 10*3/uL (ref 0.00–0.07)
Basophils Absolute: 0.1 10*3/uL (ref 0.0–0.1)
Basophils Relative: 1 %
Eosinophils Absolute: 0.1 10*3/uL (ref 0.0–0.5)
Eosinophils Relative: 2 %
HCT: 32 % — ABNORMAL LOW (ref 36.0–46.0)
Hemoglobin: 9.6 g/dL — ABNORMAL LOW (ref 12.0–15.0)
Immature Granulocytes: 1 %
Lymphocytes Relative: 36 %
Lymphs Abs: 2.2 10*3/uL (ref 0.7–4.0)
MCH: 25.7 pg — ABNORMAL LOW (ref 26.0–34.0)
MCHC: 30 g/dL (ref 30.0–36.0)
MCV: 85.8 fL (ref 80.0–100.0)
Monocytes Absolute: 0.6 10*3/uL (ref 0.1–1.0)
Monocytes Relative: 10 %
Neutro Abs: 3.2 10*3/uL (ref 1.7–7.7)
Neutrophils Relative %: 50 %
Platelets: 196 10*3/uL (ref 150–400)
RBC: 3.73 MIL/uL — ABNORMAL LOW (ref 3.87–5.11)
RDW: 18.6 % — ABNORMAL HIGH (ref 11.5–15.5)
WBC: 6.2 10*3/uL (ref 4.0–10.5)
nRBC: 0 % (ref 0.0–0.2)

## 2022-12-21 LAB — BASIC METABOLIC PANEL
Anion gap: 2 — ABNORMAL LOW (ref 5–15)
BUN: 31 mg/dL — ABNORMAL HIGH (ref 8–23)
CO2: 34 mmol/L — ABNORMAL HIGH (ref 22–32)
Calcium: 11.6 mg/dL — ABNORMAL HIGH (ref 8.9–10.3)
Chloride: 103 mmol/L (ref 98–111)
Creatinine, Ser: 6.36 mg/dL — ABNORMAL HIGH (ref 0.44–1.00)
GFR, Estimated: 7 mL/min — ABNORMAL LOW (ref >60)
Glucose, Bld: 127 mg/dL — ABNORMAL HIGH (ref 70–99)
Potassium: 4.2 mmol/L (ref 3.5–5.1)
Sodium: 139 mmol/L (ref 135–145)

## 2022-12-21 MED ORDER — ZOLEDRONIC ACID 4 MG/5ML IV CONC
4.0000 mg | Freq: Once | INTRAVENOUS | Status: AC
  Administered 2022-12-21: 4 mg via INTRAVENOUS
  Filled 2022-12-21: qty 5

## 2022-12-21 MED ORDER — ZOLEDRONIC ACID 4 MG/100ML IV SOLN
4.0000 mg | Freq: Once | INTRAVENOUS | Status: DC
  Filled 2022-12-21: qty 100

## 2022-12-21 MED ORDER — FENTANYL CITRATE PF 50 MCG/ML IJ SOSY
PREFILLED_SYRINGE | INTRAMUSCULAR | Status: AC
  Filled 2022-12-21: qty 1

## 2022-12-21 MED ORDER — EPOETIN ALFA 10000 UNIT/ML IJ SOLN
INTRAMUSCULAR | Status: AC
  Filled 2022-12-21: qty 1

## 2022-12-21 NOTE — Procedures (Signed)
Received patient in bed to unit.  Alert and oriented.  Informed consent signed and in chart.   TX duration: 3.5  Patient tolerated well.  Transported back to the room  Alert, without acute distress.  Hand-off given to patient's nurse.   Access used: Left chest Hd catheter.  Access issues: High arterial pressure.   Total UF removed: Medication(s) given: NONE    [facility]

## 2022-12-21 NOTE — Progress Notes (Signed)
Progress Note   [name] ZOX:[id number] DOB: 08/30/1950 DOA: 10/12/2022     70 DOS: the patient was seen and examined on 12/21/2022   Brief Dean course: Cynthia Dean is a 72 y.o. 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 She presents to the ED with worsening lower extremity pain. Admitted for possible Discitis/osteomyelitis at L4-L5: w/ suspected intradiscal abscess as per MRI. 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   4/18, HD access / fistula clotted off during dialysis.  4/19 vascular placed R femoral temp HD cath placed, resumed HD and pemcath placed 4/24.  5/14- back pain seems to be well-controlled.  Caseworkers have discussed the case, involved ethics committee/Dean administration. Looking into home dialysis.  Lacks capacity to make informed decision.           Principal Problem:   Back pain Active Problems:   Anemia   Bilateral leg pain   ESRD on dialysis [facility])   Essential hypertension   Dyslipidemia   Seizure disorder (HCC)   Diabetic retinopathy without macular edema associated with type 2 diabetes mellitus (HCC)   GERD (gastroesophageal reflux disease)   Diabetic neuropathy (HCC)   Anemia of chronic disease   Insulin-requiring or dependent type II diabetes mellitus (HCC)   Discitis of lumbar region   Myositis   Psoas abscess (HCC)   Malnutrition of moderate degree   Assessment and Plan: Lumbar discitis, 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   Wound cx grew staph capitis.   ID consulted. Not a good surgical candidate as per neuro surg.   Pt having severe low back and bilateral leg  pain and has not been able to tolerate any position except laying nearly flat in the bed S/p cefazolin, for 6 weeks, End Date: 12/02/22 Continue symptomatic treatment with pain medicine.  Patient still on oral Keflex for additional 4 weeks.   Confusion and agitation - improved Dean delirium  Improved.  ESRD: on HD MWF.  Anemia of chronic kidney disease. Hypokalemia Continue dialysis per nephrology.     Generalized weakness / Physical Debility:  Not able to place.   Severe back and bilateral leg pain due to severe multi-level lumbar degenerative disc disease. Peripheral neuropathy. Right foot pain. Pt has bulging discs at L2-3, L3-4, L4-5 and L5-S1. Pt will continue to have back and leg pain once antibiotics are complete. Continue pain medicine.  COPD: w/o exacerbation.  Continue on bronchodilators    DM2: well controlled HbA1c 5.2.     HTN:  Continue to monitor blood pressure.   GERD:  continue on PPI    Depression: severity unknown.  Continue on home dose of sertraline     Overweight: BMI 28.2.  Wt loss efforts recommended      Subjective:  Patient is confused, otherwise no complaint.  Physical Exam: Vitals:   12/20/22 1559 12/20/22 1959 12/21/22 0602 12/21/22 0826  BP: 133/75 (!) 142/76 (!) 180/87 (!) 161/68  Pulse: 86 82 83 80  Resp: 15 18 18 18   Temp: 97.6 F (36.4 C) 98.2 F (36.8 C) 98.1 F (36.7 C) (!) 97.5 F (36.4 C)  TempSrc:  Oral Oral Oral  SpO2: 97% 97% 95% 100%  Weight:  Height:       General exam: Appears calm and comfortable  Respiratory system: Clear to auscultation. Respiratory effort normal. Cardiovascular system: S1 & S2 heard, RRR. No JVD, murmurs, rubs, gallops or clicks. No pedal edema. Gastrointestinal system: Abdomen is nondistended, soft and nontender. No organomegaly or masses felt. Normal bowel sounds heard. Central nervous system: Alert and oriented x1. No focal neurological deficits. Extremities: Symmetric 5 x  5 power. Skin: No rashes, lesions or ulcers Psychiatry: Mood & affect appropriate.    Data Reviewed:  Lab results reviewed.  Family Communication: None  Disposition: Status is: Inpatient Remains inpatient appropriate because: Unsafe discharge.     Time spent: 50 minutes  Author: Marrion Coy, MD 12/21/2022 11:21 AM  For on call review [email or web address].

## 2022-12-21 NOTE — Progress Notes (Addendum)
[facility]  PROGRESS NOTE   Subjective:   Patient resting in bed Alert and pleasant  Remains on room No lower extremity edema  No family present   Objective:  Vital signs: Blood pressure (!) 161/68, pulse 80, temperature (!) 97.5 F (36.4 C), temperature source Oral, resp. rate 18, height 6' (1.829 m), weight 86 kg, SpO2 100 %.  Intake/Output Summary (Last 24 hours) at 12/21/2022 1049 Last data filed at 12/21/2022 1610 Gross per 24 hour  Intake 440 ml  Output 1 ml  Net 439 ml    Filed Weights   12/16/22 1235 12/19/22 0919 12/19/22 1323  Weight: 85.4 kg 85.8 kg 86 kg     Physical Exam: General:  No acute distress, laying in bed  Head:  Normocephalic, atraumatic. Moist oral mucosal membranes  Eyes:  Anicteric  Lungs:   Clear to auscultation, normal effort  Heart:  Irregular  Abdomen:   Soft, nontender, bowel sounds present  Extremities:  No peripheral edema.  Neurologic:  Alert and oriented to self and place  Skin:  No lesions  Access:  LIJ permcath    Basic Metabolic Panel: Recent Labs  Lab 12/14/22 1337 12/16/22 0852 12/18/22 1103 12/19/22 0931 12/20/22 0550 12/21/22 0408  NA 137 138 139 137 136 139  K 3.9 3.4* 4.3 4.2 3.6 4.2  CL 102 100 102 100 97* 103  CO2 26 26 29 28 29  34*  GLUCOSE 138* 141* 126* 101* 91 127*  BUN 25* 24* 29* 37* 21 31*  CREATININE 6.08* 5.46* 6.06* 7.13* 4.79* 6.36*  CALCIUM 10.4* 10.4* 11.7* 11.9* 11.0* 11.6*  PHOS 4.5 4.0  --   --   --   --     GFR: Estimated Creatinine Clearance: 9.4 mL/min (A) (by C-G formula based on SCr of 6.36 mg/dL (H)).  Liver Function Tests: Recent Labs  Lab 12/14/22 1337 12/16/22 0852  ALBUMIN 2.6* 2.7*    No results for input(s): "LIPASE", "AMYLASE" in the last 168 hours. No results for input(s): "AMMONIA" in the last 168 hours.  CBC: Recent Labs  Lab 12/14/22 1337 12/16/22 0852 12/19/22 0450 12/20/22 0550 12/21/22 0408  WBC 7.1 8.2 6.7 7.9 6.2  NEUTROABS  --   --   3.7 4.4 3.2  HGB 9.0* 9.3* 9.2* 10.3* 9.6*  HCT 30.9* 31.4* 31.2* 34.8* 32.0*  MCV 86.1 87.5 86.9 85.9 85.8  PLT 210 210 199 185 196      HbA1C: Hemoglobin A1C  Date/Time Value Ref Range Status  06/15/2014 12:58 AM 7.9 (H) 4.2 - 6.3 % Final    Comment:    The American Diabetes Association recommends that a primary goal of therapy should be <7% and that physicians should reevaluate the treatment regimen in patients with HbA1c values consistently >8%.   12/24/2013 11:18 AM 8.2 (H) 4.2 - 6.3 % Final    Comment:    The American Diabetes Association recommends that a primary goal of therapy should be <7% and that physicians should reevaluate the treatment regimen in patients with HbA1c values consistently >8%.    Hgb A1c MFr Bld  Date/Time Value Ref Range Status  10/13/2022 11:46 AM 5.2 4.8 - 5.6 % Final    Comment:    (NOTE)         Prediabetes: 5.7 - 6.4         Diabetes: >6.4         Glycemic control for adults with diabetes: <7.0   03/23/2022 06:47 AM 5.8 (H)  4.8 - 5.6 % Final    Comment:    (NOTE) Pre diabetes:          5.7%-6.4%  Diabetes:              >6.4%  Glycemic control for   <7.0% adults with diabetes     Urinalysis: No results for input(s): "COLORURINE", "LABSPEC", "PHURINE", "GLUCOSEU", "HGBUR", "BILIRUBINUR", "KETONESUR", "PROTEINUR", "UROBILINOGEN", "NITRITE", "LEUKOCYTESUR" in the last 72 hours.  Invalid input(s): "APPERANCEUR"    Imaging: No results found.   Medications:    sodium chloride Stopped (11/16/22 1155)   anticoagulant sodium citrate      (feeding supplement) PROSource Plus  30 mL Oral BID BM   cephALEXin  500 mg Oral QHS   Chlorhexidine Gluconate Cloth  6 each Topical Q0600   cinacalcet  60 mg Oral Q supper   epoetin (EPOGEN/PROCRIT) injection  10,000 Units Intravenous Q M,W,F-HD   feeding supplement (NEPRO CARB STEADY)  237 mL Oral TID BM   fentaNYL  1 patch Transdermal Q72H   gabapentin  100 mg Oral Once per day on Mon Wed  Fri   heparin injection (subcutaneous)  5,000 Units Subcutaneous Q8H   levETIRAcetam  1,000 mg Oral Daily   levETIRAcetam  250 mg Oral Q M,W,F   multivitamin  1 tablet Oral QHS   pantoprazole  20 mg Oral BID   psyllium  1 packet Oral Daily   QUEtiapine  25 mg Oral QHS   sertraline  25 mg Oral Daily   sevelamer carbonate  2.4 g Oral TID WC   sodium chloride flush  10-40 mL Intracatheter Q12H   umeclidinium-vilanterol  1 puff Inhalation Daily    Assessment/ Plan:     Ms. Janifer Scantlebury is a 72 y.o. female with end stage renal disease on hemodialysis, CAD, COPD with intermittent home O2, hypertension, hyperlipidemia, stroke, diabetes who has been admitted since 3/20. Initially admitted for vertebral osteomyelitis. She has completed IV antibiotics. Now is working on placement.   Due to extended hospitalization, patient will need outpatient dialysis clinic re-initiated.   [facility] permcath   #1 ESRD:  - Scheduled for dialysis later today. Low to no UF - Unable to contact family to discuss home dialysis and acceptance of providing treatments.    #2: Anemia with chronic kidney disease: hemoglobin 9.6 - EPO ordered with dialysis   #3: Second hyperparathyroidism: With hypercalcemia Lab Results  Component Value Date   PTH 157 (H) 12/08/2022   CALCIUM 11.6 (H) 12/21/2022   CAION 1.09 (L) 08/28/2022   PHOS 4.0 12/16/2022   -Hypercalcemia-may be due to prolonged immobilization. Will order Zolmeta 4mg  IV once.  -Continue Cinacalcet to 60 mg daily. - Continue sevelamer with meals.    #4: Hypertension Not currently on any blood pressure agents. Blood pressure now elevated, 161/68   LOS: 70 [facility] 5/29/202410:49 AM

## 2022-12-21 NOTE — Progress Notes (Signed)
Nutrition Follow-up  DOCUMENTATION CODES:   Non-severe (moderate) malnutrition in context of chronic illness  INTERVENTION:   -Continue renal MVI daily -Continue 30 ml Prosource Plus BID, each supplement provides 100 kcals and 15 grams protein -Continue Nepro Shake po TID, each supplement provides 425 kcal and 19 grams protein  -Continue 2 gram sodium diet for wider variety of meal selections -Continue feeding assistance with meals  NUTRITION DIAGNOSIS:   Moderate Malnutrition related to chronic illness (ESRD on HD) as evidenced by percent weight loss, mild muscle depletion, moderate muscle depletion, edema.  Ongoing  GOAL:   Patient will meet greater than or equal to 90% of their needs  Progressing   MONITOR:   PO intake, Supplement acceptance  REASON FOR ASSESSMENT:   Malnutrition Screening Tool    ASSESSMENT:   Pt with PMH of ESRD on HD, HLD, COPD, DM2, HTN, depression, CVA who presents with back pain x 3 weeks PTA.  Reviewed I/O's: +439 ml x 24 hours and -2.6 L since 12/07/22   Per psych eval pt unable to make an informed decision regarding decision to stop HD treatment or refuse HD treatment. TOC attempting to contact HCPOA for further goals of care discussions and plans of possibility of home HD.   Per nephrology notes, plan for HD today.   Pt sleeping soundly at time of visit. No family at bedside. She did not respond to name being called.   Pt with erratic oral intake. Noted meal completions 25-60%.   Reviewed wt hx; pt has experienced a 3.6% wt loss over the past week, which is significant for time frame. Suspect some wt loss may be related to fluid changes from HD. Pt -2.6 L since 12/07/22.  Medications reviewed and include keppra, metamucil, and renvela.   Labs reviewed: CBGS: 131.   Diet Order:   Diet Order             Diet 2 gram sodium Fluid consistency: Thin  Diet effective now                   EDUCATION NEEDS:   No education needs  have been identified at this time  Skin:  Skin Assessment: Skin Integrity Issues: Skin Integrity Issues:: Other (Comment) Other: IAD to lt buttocks  Last BM:  12/21/22 (type 5)  Height:   Ht Readings from Last 1 Encounters:  10/12/22 6' (1.829 m)    Weight:   Wt Readings from Last 1 Encounters:  12/19/22 86 kg    Ideal Body Weight:  72.7 kg  BMI:  Body mass index is 25.71 kg/m.  Estimated Nutritional Needs:   Kcal:  2150-2350  Protein:  105-120 grams  Fluid:  1000 ml + UOP    Levada Schilling, RD, LDN, CDCES Registered Dietitian II Certified Diabetes Care and Education Specialist Please refer to [facility] for RD and/or RD on-call/weekend/after hours pager

## 2022-12-22 DIAGNOSIS — Z992 Dependence on renal dialysis: Secondary | ICD-10-CM | POA: Diagnosis not present

## 2022-12-22 DIAGNOSIS — N186 End stage renal disease: Secondary | ICD-10-CM | POA: Diagnosis not present

## 2022-12-22 DIAGNOSIS — E11319 Type 2 diabetes mellitus with unspecified diabetic retinopathy without macular edema: Secondary | ICD-10-CM | POA: Diagnosis not present

## 2022-12-22 NOTE — Progress Notes (Signed)
Progress Note   [name] ZOX:[id number] DOB: 1950/08/31 DOA: 10/12/2022     71 DOS: the patient was seen and examined on 12/22/2022   Brief hospital course: Torrey Yashira Kuhrt is a 72 y.o. 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 She presents to the ED with worsening lower extremity pain. Admitted for possible Discitis/osteomyelitis at L4-L5: w/ suspected intradiscal abscess as per MRI. 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   4/18, HD access / fistula clotted off during dialysis.  4/19 vascular placed R femoral temp HD cath placed, resumed HD and pemcath placed 4/24.  5/14- back pain seems to be well-controlled.  Caseworkers have discussed the case, involved ethics committee/Hospital administration. Looking into home dialysis.  Lacks capacity to make informed decision.           Principal Problem:   Back pain Active Problems:   Anemia   Bilateral leg pain   ESRD on dialysis [facility])   Essential hypertension   Dyslipidemia   Seizure disorder (HCC)   Diabetic retinopathy without macular edema associated with type 2 diabetes mellitus (HCC)   GERD (gastroesophageal reflux disease)   Diabetic neuropathy (HCC)   Anemia of chronic disease   Insulin-requiring or dependent type II diabetes mellitus (HCC)   Discitis of lumbar region   Myositis   Psoas abscess (HCC)   Malnutrition of moderate degree   Assessment and Plan:  Lumbar discitis, 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   Wound cx grew staph capitis.   ID consulted. Not a good surgical candidate as per neuro surg.   Pt having severe low back and bilateral  leg pain and has not been able to tolerate any position except laying nearly flat in the bed S/p cefazolin, for 6 weeks, End Date: 12/02/22 Continue symptomatic treatment with pain medicine.  Patient still on oral Keflex for additional 4 weeks.   Confusion and agitation - improved Hospital delirium  Improved.   ESRD: on HD MWF.  Anemia of chronic kidney disease. Hypokalemia Continue dialysis per nephrology.     Generalized weakness / Physical Debility:  Not able to place.   Severe back and bilateral leg pain due to severe multi-level lumbar degenerative disc disease. Peripheral neuropathy. Right foot pain. Pt has bulging discs at L2-3, L3-4, L4-5 and L5-S1. Pt will continue to have back and leg pain once antibiotics are complete. Continue pain medicine.   COPD: w/o exacerbation.  Continue on bronchodilators    DM2: well controlled HbA1c 5.2.      HTN:  Continue to monitor blood pressure.   GERD:  continue on PPI    Depression: severity unknown.  Continue on home dose of sertraline     Overweight: BMI 28.2.  Wt loss efforts recommended     Patient condition stable, TOC is working on placement.     Subjective: Patient has no complaint.  Physical Exam: Vitals:   12/21/22 1913 12/21/22 1947 12/22/22 0457 12/22/22 0808  BP:  126/75 111/61 (!) 94/58  Pulse:  92 92 89  Resp:  18 18 17   Temp:  97.7 F (36.5 C) 97.8 F (36.6 C) 98.1 F (36.7 C)  TempSrc:  Oral    SpO2:  96% 93% 91%  Weight: 86 kg     Height:       General exam: Appears calm and comfortable  Respiratory system: Clear to auscultation. Respiratory effort normal. Cardiovascular system: S1 & S2 heard, RRR. No JVD, murmurs, rubs, gallops or clicks. No pedal edema. Gastrointestinal system: Abdomen is nondistended, soft and nontender. No organomegaly or masses felt. Normal bowel sounds heard. Central nervous system: Alert and oriented x1. No focal neurological deficits. Extremities: Symmetric 5 x  5 power. Skin: No rashes, lesions or ulcers Psychiatry: Flat affect   Data Reviewed:  There are no new results to review at this time.  Family Communication: None  Disposition: Status is: Inpatient Remains inpatient appropriate because: Unsafe Discharge.     Time spent: 25 minutes  Author: Marrion Coy, MD 12/22/2022 12:35 PM  For on call review [email or web address].

## 2022-12-22 NOTE — Progress Notes (Signed)
Palliative Medicine  - Chart Check   Medical records reviewed including progress notes, labs, imaging. No acute changes.    Birgitta Uhlir MS Ed.S, RN Palliative Medicine Team Phone: [phone]  

## 2022-12-22 NOTE — Plan of Care (Signed)
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 It was just the two of [place] present.  I asked pt how long she had been having dialysis.  She replied 6-7 years.  Asked if she knew what would happen if she stopped having dialysis.  She answered she would die.  Asked if she wanted to continue dialysis, and she answered "yes".

## 2022-12-22 NOTE — Discharge Planning (Addendum)
Attempted to call Cynthia Dean again this morning, to discuss Home Hemo options. No answer, mailbox is full.

## 2022-12-23 DIAGNOSIS — N186 End stage renal disease: Secondary | ICD-10-CM | POA: Diagnosis not present

## 2022-12-23 DIAGNOSIS — I10 Essential (primary) hypertension: Secondary | ICD-10-CM | POA: Diagnosis not present

## 2022-12-23 DIAGNOSIS — E119 Type 2 diabetes mellitus without complications: Secondary | ICD-10-CM | POA: Diagnosis not present

## 2022-12-23 DIAGNOSIS — Z992 Dependence on renal dialysis: Secondary | ICD-10-CM | POA: Diagnosis not present

## 2022-12-23 LAB — BASIC METABOLIC PANEL
Anion gap: 10 (ref 5–15)
BUN: 29 mg/dL — ABNORMAL HIGH (ref 8–23)
CO2: 26 mmol/L (ref 22–32)
Calcium: 10.5 mg/dL — ABNORMAL HIGH (ref 8.9–10.3)
Chloride: 98 mmol/L (ref 98–111)
Creatinine, Ser: 5.8 mg/dL — ABNORMAL HIGH (ref 0.44–1.00)
GFR, Estimated: 7 mL/min — ABNORMAL LOW (ref >60)
Glucose, Bld: 114 mg/dL — ABNORMAL HIGH (ref 70–99)
Potassium: 3.9 mmol/L (ref 3.5–5.1)
Sodium: 134 mmol/L — ABNORMAL LOW (ref 135–145)

## 2022-12-23 MED ORDER — EPOETIN ALFA 10000 UNIT/ML IJ SOLN
INTRAMUSCULAR | Status: AC
  Filled 2022-12-23: qty 1

## 2022-12-23 MED ORDER — HEPARIN SODIUM (PORCINE) 1000 UNIT/ML IJ SOLN
INTRAMUSCULAR | Status: AC
  Filled 2022-12-23: qty 10

## 2022-12-23 NOTE — Progress Notes (Signed)
   12/23/22 1300  Spiritual Encounters  Type of Visit Initial  Care provided to: Patient  Referral source Chaplain assessment  Reason for visit Routine spiritual support  OnCall Visit No  Spiritual Framework  Presenting Themes Meaning/purpose/sources of inspiration  Patient Stress Factors Not reviewed  Family Stress Factors Not reviewed  Interventions  Spiritual Care Interventions Made Established relationship of care and support;Compassionate presence  Intervention Outcomes  Outcomes Awareness of support  Spiritual Care Plan  Spiritual Care Issues Still Outstanding Chaplain will continue to follow   On routine rounding stop by patient room to see if they needed anything from Pastoral Care. Patient was relaxing and at that time that not need anything from [place] services. Advise patient that a chaplain is here if you just need someone to talk to.

## 2022-12-23 NOTE — TOC Progression Note (Signed)
Transition of Care [facility]) - Progression Note    Patient Details  Name: Cynthia Dean MRN: [id number] Date of Birth: 12/07/1950  Transition of Care [facility]) CM/SW Contact  Allena Katz, LCSW Phone Number: 12/23/2022, 2:00 PM  Clinical Narrative:   Pt added to DTP. Supervisor aware.          Expected Discharge Plan and Services                                               Social Determinants of Health (SDOH) Interventions SDOH Screenings   Food Insecurity: No Food Insecurity (10/12/2022)  Housing: Low Risk  (10/12/2022)  Transportation Needs: No Transportation Needs (10/12/2022)  Utilities: Not At Risk (10/12/2022)  Financial Resource Strain: Low Risk  (11/12/2017)  Physical Activity: Insufficiently Active (11/12/2017)  Social Connections: Moderately Integrated (11/12/2017)  Stress: No Stress Concern Present (11/12/2017)  Tobacco Use: Medium Risk (11/16/2022)    Readmission Risk Interventions     No data to display

## 2022-12-23 NOTE — Progress Notes (Signed)
[facility]  PROGRESS NOTE   Subjective:   Patient seen and evaluated during dialysis   HEMODIALYSIS FLOWSHEET:  Blood Flow Rate (mL/min): 350 mL/min Arterial Pressure (mmHg): -200 mmHg Venous Pressure (mmHg): 150 mmHg TMP (mmHg): -4 mmHg Ultrafiltration Rate (mL/min): 315 mL/min Dialysate Flow Rate (mL/min): 300 ml/min Dialysis Fluid Bolus: Normal Saline Bolus Amount (mL): 500 mL  Appears to be resting quietly   Objective:  Vital signs: Blood pressure (!) 101/50, pulse 80, temperature 97.8 F (36.6 C), temperature source Oral, resp. rate 15, height 6' (1.829 m), weight 86.6 kg, SpO2 100 %.  Intake/Output Summary (Last 24 hours) at 12/23/2022 1254 Last data filed at 12/22/2022 1905 Gross per 24 hour  Intake 200 ml  Output --  Net 200 ml    Filed Weights   12/19/22 1323 12/21/22 1913 12/23/22 0857  Weight: 86 kg 86 kg 86.6 kg     Physical Exam: General:  No acute distress, laying in bed  Head:  Normocephalic, atraumatic. Moist oral mucosal membranes  Eyes:  Anicteric  Lungs:   Clear to auscultation, normal effort  Heart:  Irregular  Abdomen:   Soft, nontender, bowel sounds present  Extremities:  No peripheral edema.  Neurologic:  Alert and oriented to self and place  Skin:  No lesions  Access:  LIJ permcath    Basic Metabolic Panel: Recent Labs  Lab 12/18/22 1103 12/19/22 0931 12/20/22 0550 12/21/22 0408 12/23/22 0431  NA 139 137 136 139 134*  K 4.3 4.2 3.6 4.2 3.9  CL 102 100 97* 103 98  CO2 29 28 29  34* 26  GLUCOSE 126* 101* 91 127* 114*  BUN 29* 37* 21 31* 29*  CREATININE 6.06* 7.13* 4.79* 6.36* 5.80*  CALCIUM 11.7* 11.9* 11.0* 11.6* 10.5*    GFR: Estimated Creatinine Clearance: 10.3 mL/min (A) (by C-G formula based on SCr of 5.8 mg/dL (H)).  Liver Function Tests: No results for input(s): "AST", "ALT", "ALKPHOS", "BILITOT", "PROT", "ALBUMIN" in the last 168 hours.  No results for input(s): "LIPASE", "AMYLASE" in the last 168  hours. No results for input(s): "AMMONIA" in the last 168 hours.  CBC: Recent Labs  Lab 12/19/22 0450 12/20/22 0550 12/21/22 0408  WBC 6.7 7.9 6.2  NEUTROABS 3.7 4.4 3.2  HGB 9.2* 10.3* 9.6*  HCT 31.2* 34.8* 32.0*  MCV 86.9 85.9 85.8  PLT 199 185 196      HbA1C: Hemoglobin A1C  Date/Time Value Ref Range Status  06/15/2014 12:58 AM 7.9 (H) 4.2 - 6.3 % Final    Comment:    The American Diabetes Association recommends that a primary goal of therapy should be <7% and that physicians should reevaluate the treatment regimen in patients with HbA1c values consistently >8%.   12/24/2013 11:18 AM 8.2 (H) 4.2 - 6.3 % Final    Comment:    The American Diabetes Association recommends that a primary goal of therapy should be <7% and that physicians should reevaluate the treatment regimen in patients with HbA1c values consistently >8%.    Hgb A1c MFr Bld  Date/Time Value Ref Range Status  10/13/2022 11:46 AM 5.2 4.8 - 5.6 % Final    Comment:    (NOTE)         Prediabetes: 5.7 - 6.4         Diabetes: >6.4         Glycemic control for adults with diabetes: <7.0   03/23/2022 06:47 AM 5.8 (H) 4.8 - 5.6 % Final  Comment:    (NOTE) Pre diabetes:          5.7%-6.4%  Diabetes:              >6.4%  Glycemic control for   <7.0% adults with diabetes     Urinalysis: No results for input(s): "COLORURINE", "LABSPEC", "PHURINE", "GLUCOSEU", "HGBUR", "BILIRUBINUR", "KETONESUR", "PROTEINUR", "UROBILINOGEN", "NITRITE", "LEUKOCYTESUR" in the last 72 hours.  Invalid input(s): "APPERANCEUR"    Imaging: No results found.   Medications:    sodium chloride Stopped (11/16/22 1155)   anticoagulant sodium citrate      (feeding supplement) PROSource Plus  30 mL Oral BID BM   cephALEXin  500 mg Oral QHS   Chlorhexidine Gluconate Cloth  6 each Topical Q0600   cinacalcet  60 mg Oral Q supper   epoetin (EPOGEN/PROCRIT) injection  10,000 Units Intravenous Q M,W,F-HD   feeding supplement  (NEPRO CARB STEADY)  237 mL Oral TID BM   fentaNYL  1 patch Transdermal Q72H   gabapentin  100 mg Oral Once per day on Mon Wed Fri   heparin injection (subcutaneous)  5,000 Units Subcutaneous Q8H   levETIRAcetam  1,000 mg Oral Daily   levETIRAcetam  250 mg Oral Q M,W,F   multivitamin  1 tablet Oral QHS   pantoprazole  20 mg Oral BID   psyllium  1 packet Oral Daily   QUEtiapine  25 mg Oral QHS   sertraline  25 mg Oral Daily   sevelamer carbonate  2.4 g Oral TID WC   sodium chloride flush  10-40 mL Intracatheter Q12H   umeclidinium-vilanterol  1 puff Inhalation Daily    Assessment/ Plan:     [name] is a 72 y.o. female with end stage renal disease on hemodialysis, CAD, COPD with intermittent home O2, hypertension, hyperlipidemia, stroke, diabetes who has been admitted since 3/20. Initially admitted for vertebral osteomyelitis. She has completed IV antibiotics. Now is working on placement.   Due to extended hospitalization, patient will need outpatient dialysis clinic re-initiated.   [facility] permcath   #1 ESRD:  -Patient receiving dialysis, UF goal 0.5 to 1 L as tolerated. - Attempting to discuss home dialysis modalities with family, unable to contact. - Would recommend ethics and palliative care follow-up to determine goals of treatment at this time.   #2: Anemia with chronic kidney disease: hemoglobin 9.6 - EPO ordered with dialysis   #3: Second hyperparathyroidism: With hypercalcemia Lab Results  Component Value Date   PTH 157 (H) 12/08/2022   CALCIUM 10.5 (H) 12/23/2022   CAION 1.09 (L) 08/28/2022   PHOS 4.0 12/16/2022   -Hypercalcemia-may be due to prolonged immobilization.  Received Zolmeta 4mg  IV on 12/22/2022 -Continue Cinacalcet to 60 mg daily. - Continue sevelamer with meals.    #4: Hypertension Not currently on any blood pressure agents. Blood pressure 107/58 during dialysis.   LOS: [id number] [facility] 5/31/202412:54 PM

## 2022-12-23 NOTE — Progress Notes (Signed)
Progress Note   [name] ZOX:[id number] DOB: 07-24-1951 DOA: 10/12/2022     72 DOS: the patient was seen and examined on 12/23/2022   Brief hospital course: Cynthia Dean is a 72 y.o. 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 She presents to the ED with worsening lower extremity pain. Admitted for possible Discitis/osteomyelitis at L4-L5: w/ suspected intradiscal abscess as per MRI. 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   4/18, HD access / fistula clotted off during dialysis.  4/19 vascular placed R femoral temp HD cath placed, resumed HD and pemcath placed 4/24.  5/14- back pain seems to be well-controlled.  Caseworkers have discussed the case, involved ethics committee/Hospital administration. Looking into home dialysis.  Lacks capacity to make informed decision.    Principal Problem:   Back pain Active Problems:   Anemia   Bilateral leg pain   ESRD on dialysis [facility])   Essential hypertension   Dyslipidemia   Seizure disorder (HCC)   Diabetic retinopathy without macular edema associated with type 2 diabetes mellitus (HCC)   GERD (gastroesophageal reflux disease)   Diabetic neuropathy (HCC)   Anemia of chronic disease   Insulin-requiring or dependent type II diabetes mellitus (HCC)   Discitis of lumbar region   Myositis   Psoas abscess (HCC)   Malnutrition of moderate degree   Assessment and Plan: Lumbar discitis, 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   Wound cx grew staph capitis.   ID consulted. Not a good surgical candidate as per neuro surg.   Pt having severe low back and bilateral leg pain and has  not been able to tolerate any position except laying nearly flat in the bed S/p cefazolin, for 6 weeks, End Date: 12/02/22 Continue symptomatic treatment with pain medicine.  Patient still on oral Keflex for additional 4 weeks.   Confusion and agitation - improved Hospital delirium  Improved.   ESRD: on HD MWF.  Anemia of chronic kidney disease. Hypokalemia Continue dialysis per nephrology.     Generalized weakness / Physical Debility:  Not able to place.   Severe back and bilateral leg pain due to severe multi-level lumbar degenerative disc disease. Peripheral neuropathy. Right foot pain. Pt has bulging discs at L2-3, L3-4, L4-5 and L5-S1. Pt will continue to have back and leg pain once antibiotics are complete. Continue pain medicine.   COPD: w/o exacerbation.  Continue on bronchodilators    DM2: well controlled HbA1c 5.2.      HTN:  Continue to monitor blood pressure.   GERD:  continue on PPI    Depression: severity unknown.  Continue on home dose of sertraline     Overweight: BMI 28.2.  Wt loss efforts recommended   Patient is in dialysis, currently no issue, pending placement.    Subjective: Confused, no complaint.  Physical Exam: Vitals:   12/23/22 0930 12/23/22 1000 12/23/22 1030 12/23/22 1100  BP: 118/67 (!) 108/59 114/60 105/63  Pulse: 87 87 95 85  Resp: 17 20 16 15   Temp:      TempSrc:      SpO2: 99% 100% 100% 100%  Weight:      Height:       General exam: Appears  calm and comfortable  Respiratory system: Clear to auscultation. Respiratory effort normal. Cardiovascular system: S1 & S2 heard, RRR. No JVD, murmurs, rubs, gallops or clicks. No pedal edema. Gastrointestinal system: Abdomen is nondistended, soft and nontender. No organomegaly or masses felt. Normal bowel sounds heard. Central nervous system: Alert and oriented x1. No focal neurological deficits. Extremities: Symmetric 5 x 5 power. Skin: No rashes, lesions or ulcers Psychiatry:  Flat affect   Data Reviewed:  There are no new results to review at this time.  Family Communication:   Disposition: Status is: Inpatient Remains inpatient appropriate because: Unsafe discharge option.     Time spent: 25 minutes  Author: Marrion Coy, MD 12/23/2022 11:08 AM  For on call review [email or web address].

## 2022-12-24 DIAGNOSIS — Z992 Dependence on renal dialysis: Secondary | ICD-10-CM | POA: Diagnosis not present

## 2022-12-24 DIAGNOSIS — I10 Essential (primary) hypertension: Secondary | ICD-10-CM | POA: Diagnosis not present

## 2022-12-24 DIAGNOSIS — N186 End stage renal disease: Secondary | ICD-10-CM | POA: Diagnosis not present

## 2022-12-24 MED ORDER — PANTOPRAZOLE SODIUM 40 MG PO TBEC
40.0000 mg | DELAYED_RELEASE_TABLET | Freq: Two times a day (BID) | ORAL | Status: DC
  Administered 2022-12-24 – 2023-01-12 (×29): 40 mg via ORAL
  Filled 2022-12-24 (×34): qty 1

## 2022-12-24 MED ORDER — ACETAMINOPHEN 325 MG PO TABS
650.0000 mg | ORAL_TABLET | Freq: Four times a day (QID) | ORAL | Status: DC | PRN
  Administered 2022-12-24 – 2023-01-10 (×3): 650 mg via ORAL
  Filled 2022-12-24 (×3): qty 2

## 2022-12-24 MED ORDER — CALCIUM CARBONATE ANTACID 500 MG PO CHEW: 400.0000 mg | CHEWABLE_TABLET | Freq: Three times a day (TID) | ORAL | Status: DC | PRN

## 2022-12-24 NOTE — Progress Notes (Signed)
Progress Note   [name] WUJ:[id number] DOB: 03-04-1951 DOA: 10/12/2022     72 DOS: the patient was seen and examined on 12/24/2022   Brief hospital course: Willy Alanna Schoch is a 72 y.o. 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 She presents to the ED with worsening lower extremity pain. Admitted for possible Discitis/osteomyelitis at L4-L5: w/ suspected intradiscal abscess as per MRI. 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   4/18, HD access / fistula clotted off during dialysis.  4/19 vascular placed R femoral temp HD cath placed, resumed HD and pemcath placed 4/24.  5/14- back pain seems to be well-controlled.  Caseworkers have discussed the case, involved ethics committee/Hospital administration. Looking into home dialysis.  Lacks capacity to make informed decision.    Principal Problem:   Back pain Active Problems:   Anemia   Bilateral leg pain   ESRD on dialysis [facility])   Essential hypertension   Dyslipidemia   Seizure disorder (HCC)   Diabetic retinopathy without macular edema associated with type 2 diabetes mellitus (HCC)   GERD (gastroesophageal reflux disease)   Diabetic neuropathy (HCC)   Anemia of chronic disease   Insulin-requiring or dependent type II diabetes mellitus (HCC)   Discitis of lumbar region   Myositis   Psoas abscess (HCC)   Malnutrition of moderate degree   Assessment and Plan: Lumbar discitis, 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   Wound cx grew staph capitis.   ID consulted. Not a good surgical candidate as per neuro surg.   Pt having severe low back and bilateral leg pain and has  not been able to tolerate any position except laying nearly flat in the bed S/p cefazolin, for 6 weeks, End Date: 12/02/22 Continue symptomatic treatment with pain medicine.  Patient still on oral Keflex for additional 4 weeks.   Confusion and agitation - improved Hospital delirium  Improved.   ESRD: on HD MWF.  Anemia of chronic kidney disease. Hypokalemia Continue dialysis per nephrology.     Generalized weakness / Physical Debility:  Not able to place.   Severe back and bilateral leg pain due to severe multi-level lumbar degenerative disc disease. Peripheral neuropathy. Right foot pain. Pt has bulging discs at L2-3, L3-4, L4-5 and L5-S1. Pt will continue to have back and leg pain once antibiotics are complete. Continue pain medicine.   COPD: w/o exacerbation.  Continue on bronchodilators    DM2: well controlled HbA1c 5.2.      HTN:  Continue to monitor blood pressure.   GERD:  continue on PPI    Depression: severity unknown.  Continue on home dose of sertraline     Overweight: BMI 28.2.  Wt loss efforts recommended   No new issues, no discharge option.    Subjective: Patient has no complaint.  Physical Exam: Vitals:   12/23/22 1616 12/23/22 2038 12/24/22 0451 12/24/22 0739  BP: 115/60 (!) 101/51 106/67 (!) 117/56  Pulse: 90 95 60 87  Resp: 16 18 19 18   Temp: 98.8 F (37.1 C) 98.5 F (36.9 C) 98.7 F (37.1 C) 98.7 F (37.1 C)  TempSrc:  Oral Oral Oral  SpO2: 100% 94% 91% 96%  Weight:  Height:       General exam: Appears calm and comfortable  Respiratory system: Clear to auscultation. Respiratory effort normal. Cardiovascular system: S1 & S2 heard, RRR. No JVD, murmurs, rubs, gallops or clicks. No pedal edema. Gastrointestinal system: Abdomen is nondistended, soft and nontender. No organomegaly or masses felt. Normal bowel sounds heard. Central nervous system: Alert and oriented x1. No focal neurological deficits. Extremities: Symmetric 5 x 5  power. Skin: No rashes, lesions or ulcers Psychiatry:  Mood & affect appropriate.    Data Reviewed:  There are no new results to review at this time.  Family Communication: None  Disposition: Status is: Inpatient Remains inpatient appropriate because: Unsafe discharge option.     Time spent: 25 minutes  Author: Marrion Coy, MD 12/24/2022 12:51 PM  For on call review [email or web address].

## 2022-12-25 DIAGNOSIS — I10 Essential (primary) hypertension: Secondary | ICD-10-CM | POA: Diagnosis not present

## 2022-12-25 DIAGNOSIS — Z992 Dependence on renal dialysis: Secondary | ICD-10-CM | POA: Diagnosis not present

## 2022-12-25 DIAGNOSIS — N186 End stage renal disease: Secondary | ICD-10-CM | POA: Diagnosis not present

## 2022-12-25 NOTE — Progress Notes (Signed)
Progress Note   [name] [id number]:[id number] DOB: Dec 02, 1950 DOA: 10/12/2022     74 DOS: the patient was seen and examined on 12/25/2022   Brief hospital course: Kriti Pippa Tallarico is a 72 y.o. 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 She presents to the ED with worsening lower extremity pain. Admitted for possible Discitis/osteomyelitis at L4-L5: w/ suspected intradiscal abscess as per MRI. 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   4/18, HD access / fistula clotted off during dialysis.  4/19 vascular placed R femoral temp HD cath placed, resumed HD and pemcath placed 4/24.  5/14- back pain seems to be well-controlled.  Caseworkers have discussed the case, involved ethics committee/Hospital administration. Looking into home dialysis.  Lacks capacity to make informed decision.    Principal Problem:   Back pain Active Problems:   Anemia   Bilateral leg pain   ESRD on dialysis [facility])   Essential hypertension   Dyslipidemia   Seizure disorder (HCC)   Diabetic retinopathy without macular edema associated with type 2 diabetes mellitus (HCC)   GERD (gastroesophageal reflux disease)   Diabetic neuropathy (HCC)   Anemia of chronic disease   Insulin-requiring or dependent type II diabetes mellitus (HCC)   Discitis of lumbar region   Myositis   Psoas abscess (HCC)   Malnutrition of moderate degree   Assessment and Plan: Lumbar discitis, 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   Wound cx grew staph capitis.   ID consulted. Not a good surgical candidate as per neuro surg.   Pt having severe low back and bilateral leg pain and has  not been able to tolerate any position except laying nearly flat in the bed S/p cefazolin, for 6 weeks, End Date: 12/02/22 Continue symptomatic treatment with pain medicine.  Patient still on oral Keflex for additional 4 weeks.   Confusion and agitation - improved Hospital delirium  Improved.   ESRD: on HD MWF.  Anemia of chronic kidney disease. Hypokalemia Continue dialysis per nephrology.     Generalized weakness / Physical Debility:  Not able to place.   Severe back and bilateral leg pain due to severe multi-level lumbar degenerative disc disease. Peripheral neuropathy. Right foot pain. Pt has bulging discs at L2-3, L3-4, L4-5 and L5-S1. Pt will continue to have back and leg pain once antibiotics are complete. Continue pain medicine.   COPD: w/o exacerbation.  Continue on bronchodilators    DM2: well controlled HbA1c 5.2.    Condition stable, no change in treatment plan.  Pending placement.    Subjective: Patient has no complaint.  Physical Exam: Vitals:   12/24/22 1620 12/24/22 2055 12/25/22 0444 12/25/22 0717  BP: (!) 109/54 131/78 110/75 111/61  Pulse: 91 89 91 85  Resp: 18 16 16 17   Temp: 98.5 F (36.9 C) 98.1 F (36.7 C) 97.7 F (36.5 C) 98.1 F (36.7 C)  TempSrc:  Oral Oral   SpO2: 92% 94% 93% 98%  Weight:      Height:       General exam: Appears calm and comfortable  Respiratory system: Clear to auscultation. Respiratory effort normal. Cardiovascular system: S1 & S2 heard, RRR. No JVD, murmurs, rubs, gallops or clicks. No pedal edema.  Gastrointestinal system: Abdomen is nondistended, soft and nontender. No organomegaly or masses felt. Normal bowel sounds heard. Central nervous system: Alert and oriented x1. No focal neurological deficits. Extremities: Symmetric 5 x 5 power. Skin: No rashes, lesions or ulcers Psychiatry:  Mood & affect appropriate.    Data Reviewed:  There are no new results to review at this time.  Family Communication:  None  Disposition: Status is: Inpatient Remains inpatient appropriate because: No discharge option.     Time spent: 25 minutes  Author: Marrion Coy, MD 12/25/2022 12:30 PM  For on call review [email or web address].

## 2022-12-25 NOTE — Plan of Care (Signed)
  Problem: Education: Goal: Ability to describe self-care measures that may prevent or decrease complications (Diabetes Survival Skills Education) will improve Outcome: Progressing Goal: Individualized Educational Video(s) Outcome: Progressing   Problem: Coping: Goal: Ability to adjust to condition or change in health will improve Outcome: Progressing   Problem: Fluid Volume: Goal: Ability to maintain a balanced intake and output will improve Outcome: Progressing   Problem: Skin Integrity: Goal: Risk for impaired skin integrity will decrease Outcome: Progressing   Problem: Coping: Goal: Level of anxiety will decrease Outcome: Progressing

## 2022-12-26 ENCOUNTER — Inpatient Hospital Stay: Payer: 59

## 2022-12-26 DIAGNOSIS — Z992 Dependence on renal dialysis: Secondary | ICD-10-CM | POA: Diagnosis not present

## 2022-12-26 DIAGNOSIS — M869 Osteomyelitis, unspecified: Secondary | ICD-10-CM | POA: Diagnosis not present

## 2022-12-26 DIAGNOSIS — N186 End stage renal disease: Secondary | ICD-10-CM | POA: Diagnosis not present

## 2022-12-26 DIAGNOSIS — I10 Essential (primary) hypertension: Secondary | ICD-10-CM | POA: Diagnosis not present

## 2022-12-26 LAB — RENAL FUNCTION PANEL
Albumin: 2.6 g/dL — ABNORMAL LOW (ref 3.5–5.0)
Anion gap: 13 (ref 5–15)
BUN: 55 mg/dL — ABNORMAL HIGH (ref 8–23)
CO2: 26 mmol/L (ref 22–32)
Calcium: 8.5 mg/dL — ABNORMAL LOW (ref 8.9–10.3)
Chloride: 97 mmol/L — ABNORMAL LOW (ref 98–111)
Creatinine, Ser: 8.13 mg/dL — ABNORMAL HIGH (ref 0.44–1.00)
GFR, Estimated: 5 mL/min — ABNORMAL LOW (ref >60)
Glucose, Bld: 125 mg/dL — ABNORMAL HIGH (ref 70–99)
Phosphorus: 3.6 mg/dL (ref 2.5–4.6)
Potassium: 4.3 mmol/L (ref 3.5–5.1)
Sodium: 136 mmol/L (ref 135–145)

## 2022-12-26 LAB — CBC
HCT: 32.3 % — ABNORMAL LOW (ref 36.0–46.0)
Hemoglobin: 9.6 g/dL — ABNORMAL LOW (ref 12.0–15.0)
MCH: 25.9 pg — ABNORMAL LOW (ref 26.0–34.0)
MCHC: 29.7 g/dL — ABNORMAL LOW (ref 30.0–36.0)
MCV: 87.3 fL (ref 80.0–100.0)
Platelets: 219 10*3/uL (ref 150–400)
RBC: 3.7 MIL/uL — ABNORMAL LOW (ref 3.87–5.11)
RDW: 18.8 % — ABNORMAL HIGH (ref 11.5–15.5)
WBC: 5.4 10*3/uL (ref 4.0–10.5)
nRBC: 0 % (ref 0.0–0.2)

## 2022-12-26 MED ORDER — HEPARIN SODIUM (PORCINE) 1000 UNIT/ML IJ SOLN
INTRAMUSCULAR | Status: AC
  Filled 2022-12-26: qty 10

## 2022-12-26 MED ORDER — LIDOCAINE 5 % EX PTCH
1.0000 | MEDICATED_PATCH | CUTANEOUS | Status: DC
  Administered 2022-12-26 – 2023-01-12 (×16): 1 via TRANSDERMAL
  Filled 2022-12-26 (×18): qty 1

## 2022-12-26 MED ORDER — EPOETIN ALFA 10000 UNIT/ML IJ SOLN
INTRAMUSCULAR | Status: AC
  Filled 2022-12-26: qty 1

## 2022-12-26 NOTE — Progress Notes (Signed)
Progress Note   [name] DOB: [birth year] DOA: 10/12/2022     75 DOS: the patient was seen and examined on 12/26/2022   Brief hospital course: Cynthia Dean is a 72 y.o. 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 She presents to the ED with worsening lower extremity pain. Admitted for possible Discitis/osteomyelitis at L4-L5: w/ suspected intradiscal abscess as per MRI. 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   4/18, HD access / fistula clotted off during dialysis.  4/19 vascular placed R femoral temp HD cath placed, resumed HD and pemcath placed 4/24.  5/14- back pain seems to be well-controlled.  Caseworkers have discussed the case, involved ethics committee/Hospital administration. Looking into home dialysis.  Lacks capacity to make informed decision.    Principal Problem:   Back pain Active Problems:   Anemia   Bilateral leg pain   ESRD on dialysis [facility])   Essential hypertension   Dyslipidemia   Seizure disorder (HCC)   Diabetic retinopathy without macular edema associated with type 2 diabetes mellitus (HCC)   GERD (gastroesophageal reflux disease)   Diabetic neuropathy (HCC)   Anemia of chronic disease   Insulin-requiring or dependent type II diabetes mellitus (HCC)   Discitis of lumbar region   Myositis   Psoas abscess (HCC)   Malnutrition of moderate degree   Knee osteomyelits, right (HCC)   Assessment and Plan: Right knee pain due to osteoarthritis. Plain right knee pain, x-ray showed mild effusion with osteoarthritis.  Will start Lidoderm patch in addition to as needed acetaminophen.   Lumbar discitis, L4-L5 epidural phlegmon and b/l psoas abscess on MRI S/p IR aspiration of psoas abscess on 10/13/22 Low back pain, Bilateral Leg Pain - due  to above Vanco and cefepime on 10/13/22 after procedure.   Wound cx grew staph capitis.   ID consulted. Not a good surgical candidate as per neuro surg.   Pt having severe low back and bilateral leg pain and has not been able to tolerate any position except laying nearly flat in the bed S/p cefazolin, for 6 weeks, End Date: 12/02/22 Continue symptomatic treatment with pain medicine.  Patient still on oral Keflex for additional 4 weeks.   Confusion and agitation - improved Hospital delirium  Improved.   ESRD: on HD MWF.  Anemia of chronic kidney disease. Hypokalemia Continue dialysis per nephrology.     Generalized weakness / Physical Debility:  Not able to place.   Severe back and bilateral leg pain due to severe multi-level lumbar degenerative disc disease. Peripheral neuropathy. Right foot pain. Pt has bulging discs at L2-3, L3-4, L4-5 and L5-S1. Pt will continue to have back and leg pain once antibiotics are complete. Continue pain medicine.   COPD: w/o exacerbation.  Continue on bronchodilators    DM2: well controlled HbA1c 5.2.       Subjective:  Right knee pain.  Physical Exam: Vitals:   12/25/22 1623 12/25/22 2134 12/26/22 0558 12/26/22 0728  BP: 116/64 132/68 123/62 (!) 125/52  Pulse: 87 84 81 84  Resp: 17 18 18 17   Temp: 97.7 F (36.5 C) 98.2 F (36.8 C) 98.1 F (36.7 C) 97.9 F (36.6 C)  TempSrc:      SpO2: 100% 96% 91% 93%  Weight:      Height:       General exam: Appears calm and comfortable  Respiratory system: Clear to auscultation. Respiratory effort normal. Cardiovascular system: S1 & S2 heard, RRR. No JVD, murmurs, rubs, gallops or clicks. No pedal edema. Gastrointestinal system: Abdomen is nondistended, soft and nontender. No organomegaly or masses felt. Normal bowel sounds heard. Central nervous system: Alert and oriented x1. No focal neurological deficits. Extremities: Right knee mildly swollen. Skin: No rashes, lesions or  ulcers Psychiatry: Mood & affect appropriate.    Data Reviewed:  Reviewed x-ray results.  Family Communication: None  Disposition: Status is: Inpatient Remains inpatient appropriate because: Unsafe discharge option.     Time spent: 35 minutes  Author: Marrion Coy, MD 12/26/2022 12:55 PM  For on call review [email or web address].

## 2022-12-26 NOTE — Progress Notes (Signed)
Hemodialysis Note  Received patient in bed to unit. Alert and oriented. Informed consent signed and in chart.   Treatment initiated:1300 Treatment completed:1706  Patient tolerated treatment well. Transported back to the room alert, without acute distress. Report given to patient's RN.  Access used:LIJ Catheter  Access issues: High Arterial Pressures   Total UF removed: 0.5 liters/500 ml  Medications given:Epogen, Heparin Post HD VS: Stable  Post HD weight: 69.2 kg all bedding removed  Treatment paused for flushing of dialyzer and heparinization, due to high arterial pressures. Treatment resumed with adequate results.   Bartolo Darter, RN [facility]

## 2022-12-26 NOTE — Progress Notes (Signed)
[facility]  PROGRESS NOTE   Subjective:   Patient seen sitting up in bed No family at bedside  Was able to place bed in mild chair position Tolerating position well.   Dialysis scheduled for later today.    Objective:  Vital signs: Blood pressure (!) 125/52, pulse 84, temperature 97.9 F (36.6 C), resp. rate 17, height 6' (1.829 m), weight 86.1 kg, SpO2 93 %. No intake or output data in the 24 hours ending 12/26/22 1259  Filed Weights   12/23/22 0857 12/23/22 1230 12/23/22 1300  Weight: 86.6 kg 86.1 kg 86.1 kg     Physical Exam: General:  No acute distress, laying in bed  Head:  Normocephalic, atraumatic. Moist oral mucosal membranes  Eyes:  Anicteric  Lungs:   Clear to auscultation, normal effort  Heart:  Irregular  Abdomen:   Soft, nontender, bowel sounds present  Extremities:  No peripheral edema.  Neurologic:  Alert and oriented to self and place  Skin:  No lesions  Access:  LIJ permcath    Basic Metabolic Panel: Recent Labs  Lab 12/20/22 0550 12/21/22 0408 12/23/22 0431  NA 136 139 134*  K 3.6 4.2 3.9  CL 97* 103 98  CO2 29 34* 26  GLUCOSE 91 127* 114*  BUN 21 31* 29*  CREATININE 4.79* 6.36* 5.80*  CALCIUM 11.0* 11.6* 10.5*    GFR: Estimated Creatinine Clearance: 10.3 mL/min (A) (by C-G formula based on SCr of 5.8 mg/dL (H)).  Liver Function Tests: No results for input(s): "AST", "ALT", "ALKPHOS", "BILITOT", "PROT", "ALBUMIN" in the last 168 hours.  No results for input(s): "LIPASE", "AMYLASE" in the last 168 hours. No results for input(s): "AMMONIA" in the last 168 hours.  CBC: Recent Labs  Lab 12/20/22 0550 12/21/22 0408  WBC 7.9 6.2  NEUTROABS 4.4 3.2  HGB 10.3* 9.6*  HCT 34.8* 32.0*  MCV 85.9 85.8  PLT 185 196      HbA1C: Hemoglobin A1C  Date/Time Value Ref Range Status  06/15/2014 12:58 AM 7.9 (H) 4.2 - 6.3 % Final    Comment:    The American Diabetes Association recommends that a primary goal of therapy  should be <7% and that physicians should reevaluate the treatment regimen in patients with HbA1c values consistently >8%.   12/24/2013 11:18 AM 8.2 (H) 4.2 - 6.3 % Final    Comment:    The American Diabetes Association recommends that a primary goal of therapy should be <7% and that physicians should reevaluate the treatment regimen in patients with HbA1c values consistently >8%.    Hgb A1c MFr Bld  Date/Time Value Ref Range Status  10/13/2022 11:46 AM 5.2 4.8 - 5.6 % Final    Comment:    (NOTE)         Prediabetes: 5.7 - 6.4         Diabetes: >6.4         Glycemic control for adults with diabetes: <7.0   03/23/2022 06:47 AM 5.8 (H) 4.8 - 5.6 % Final    Comment:    (NOTE) Pre diabetes:          5.7%-6.4%  Diabetes:              >6.4%  Glycemic control for   <7.0% adults with diabetes     Urinalysis: No results for input(s): "COLORURINE", "LABSPEC", "PHURINE", "GLUCOSEU", "HGBUR", "BILIRUBINUR", "KETONESUR", "PROTEINUR", "UROBILINOGEN", "NITRITE", "LEUKOCYTESUR" in the last 72 hours.  Invalid input(s): "APPERANCEUR"    Imaging: DG Knee  1-2 Views Right  Result Date: 12/26/2022 CLINICAL DATA:  Right knee pain and swelling. EXAM: RIGHT KNEE - 1-2 VIEW COMPARISON:  Right tibia and fibula films on 05/09/2022 FINDINGS: No evidence of fracture, dislocation, or joint effusion. Mild medial joint space narrowing and patellofemoral proliferative disease. No bony lesions or destruction. Arterial calcifications involving the distal SFA, popliteal artery and visualized proximal tibial arteries. IMPRESSION: 1. 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 2. Peripheral vascular disease. Electronically Signed   By: Irish Lack M.D.   On: 12/26/2022 08:53     Medications:    sodium chloride Stopped (11/16/22 1155)   anticoagulant sodium citrate      (feeding supplement) PROSource Plus  30 mL Oral BID BM   cephALEXin  500 mg Oral QHS   Chlorhexidine Gluconate  Cloth  6 each Topical Q0600   cinacalcet  60 mg Oral Q supper   epoetin (EPOGEN/PROCRIT) injection  10,000 Units Intravenous Q M,W,F-HD   feeding supplement (NEPRO CARB STEADY)  237 mL Oral TID BM   fentaNYL  1 patch Transdermal Q72H   gabapentin  100 mg Oral Once per day on Mon Wed Fri   heparin injection (subcutaneous)  5,000 Units Subcutaneous Q8H   levETIRAcetam  1,000 mg Oral Daily   levETIRAcetam  250 mg Oral Q M,W,F   lidocaine  1 patch Transdermal Q24H   multivitamin  1 tablet Oral QHS   pantoprazole  40 mg Oral BID   psyllium  1 packet Oral Daily   QUEtiapine  25 mg Oral QHS   sertraline  25 mg Oral Daily   sevelamer carbonate  2.4 g Oral TID WC   sodium chloride flush  10-40 mL Intracatheter Q12H   umeclidinium-vilanterol  1 puff Inhalation Daily    Assessment/ Plan:     Cynthia Dean is a 72 y.o. female with end stage renal disease on hemodialysis, CAD, COPD with intermittent home O2, hypertension, hyperlipidemia, stroke, diabetes who has been admitted since 3/20. Initially admitted for vertebral osteomyelitis. She has completed IV antibiotics. Now is working on placement.   Due to extended hospitalization, patient will need outpatient dialysis clinic re-initiated.   [facility] TTS [facility] permcath   #1 ESRD:  -Receiving dialysis today, low UF.  - Next treatment scheduled for Wednesday - Would recommend ethics and palliative care follow-up to determine goals of treatment at this time.   #2: Anemia with chronic kidney disease: hemoglobin 9.6 - Continue EPO ordered with dialysis   #3: Second hyperparathyroidism: With hypercalcemia Lab Results  Component Value Date   PTH 157 (H) 12/08/2022   CALCIUM 10.5 (H) 12/23/2022   CAION 1.09 (L) 08/28/2022   PHOS 4.0 12/16/2022   -Hypercalcemia-may be due to prolonged immobilization.  Received Zolmeta 4mg  IV on 12/22/2022 -Continue Cinacalcet to 60 mg daily. - Continue sevelamer with meals.     #4: Hypertension Not currently on any blood pressure agents. Blood pressure 138/70   LOS: 75 [facility] 6/3/202412:59 PM

## 2022-12-27 DIAGNOSIS — N186 End stage renal disease: Secondary | ICD-10-CM | POA: Diagnosis not present

## 2022-12-27 DIAGNOSIS — I10 Essential (primary) hypertension: Secondary | ICD-10-CM | POA: Diagnosis not present

## 2022-12-27 DIAGNOSIS — Z992 Dependence on renal dialysis: Secondary | ICD-10-CM | POA: Diagnosis not present

## 2022-12-27 MED ORDER — SEVELAMER CARBONATE 800 MG PO TABS
2400.0000 mg | ORAL_TABLET | Freq: Three times a day (TID) | ORAL | Status: DC
  Administered 2022-12-27 – 2023-01-03 (×15): 2400 mg via ORAL
  Filled 2022-12-27 (×17): qty 3

## 2022-12-27 NOTE — Plan of Care (Signed)
  Problem: Education: Goal: Ability to describe self-care measures that may prevent or decrease complications (Diabetes Survival Skills Education) will improve Outcome: Progressing Goal: Individualized Educational Video(s) Outcome: Progressing   Problem: Coping: Goal: Ability to adjust to condition or change in health will improve Outcome: Progressing   Problem: Fluid Volume: Goal: Ability to maintain a balanced intake and output will improve Outcome: Progressing   Problem: Health Behavior/Discharge Planning: Goal: Ability to identify and utilize available resources and services will improve Outcome: Progressing Goal: Ability to manage health-related needs will improve Outcome: Progressing   Problem: Metabolic: Goal: Ability to maintain appropriate glucose levels will improve Outcome: Progressing   Problem: Nutritional: Goal: Maintenance of adequate nutrition will improve Outcome: Progressing Goal: Progress toward achieving an optimal weight will improve Outcome: Progressing   Problem: Skin Integrity: Goal: Risk for impaired skin integrity will decrease Outcome: Progressing   Problem: Tissue Perfusion: Goal: Adequacy of tissue perfusion will improve Outcome: Progressing   Problem: Clinical Measurements: Goal: Ability to maintain clinical measurements within normal limits will improve Outcome: Progressing Goal: Will remain free from infection Outcome: Progressing Goal: Diagnostic test results will improve Outcome: Progressing Goal: Respiratory complications will improve Outcome: Progressing Goal: Cardiovascular complication will be avoided Outcome: Progressing

## 2022-12-27 NOTE — Progress Notes (Signed)
Progress Note   [name] VWU:[id number] DOB: [birth year] DOA: 10/12/2022     72 DOS: the patient was seen and examined on 12/27/2022   Brief hospital course: Cynthia Dean is a 72 y.o. 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 She presents to the ED with worsening lower extremity pain. Admitted for possible Discitis/osteomyelitis at L4-L5: w/ suspected intradiscal abscess as per MRI. 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   4/18, HD access / fistula clotted off during dialysis.  4/19 vascular placed R femoral temp HD cath placed, resumed HD and pemcath placed 4/24.  5/14- back pain seems to be well-controlled.  Caseworkers have discussed the case, involved ethics committee/Hospital administration. Looking into home dialysis.  Lacks capacity to make informed decision.    Principal Problem:   Back pain Active Problems:   Anemia   Bilateral leg pain   ESRD on dialysis [facility])   Essential hypertension   Dyslipidemia   Seizure disorder (HCC)   Diabetic retinopathy without macular edema associated with type 2 diabetes mellitus (HCC)   GERD (gastroesophageal reflux disease)   Diabetic neuropathy (HCC)   Anemia of chronic disease   Insulin-requiring or dependent type II diabetes mellitus (HCC)   Discitis of lumbar region   Myositis   Psoas abscess (HCC)   Malnutrition of moderate degree   Knee osteomyelits, right (HCC)   Assessment and Plan: Right knee pain due to osteoarthritis. Plain right knee pain, x-ray showed mild effusion with osteoarthritis.  Started Lidoderm patch in addition to as needed acetaminophen.     Lumbar discitis, L4-L5 epidural phlegmon and b/l psoas abscess on MRI S/p IR aspiration of psoas abscess on 10/13/22 Low back pain, Bilateral Leg Pain - due  to above Vanco and cefepime on 10/13/22 after procedure.   Wound cx grew staph capitis.   ID consulted. Not a good surgical candidate as per neuro surg.   Pt having severe low back and bilateral leg pain and has not been able to tolerate any position except laying nearly flat in the bed S/p cefazolin, for 6 weeks, End Date: 12/02/22 Continue symptomatic treatment with pain medicine.  Patient still on oral Keflex for additional 4 weeks.   Confusion and agitation - improved Hospital delirium  Improved.   ESRD: on HD MWF.  Anemia of chronic kidney disease. Hypokalemia Continue dialysis per nephrology.     Generalized weakness / Physical Debility:  Not able to place.   Severe back and bilateral leg pain due to severe multi-level lumbar degenerative disc disease. Peripheral neuropathy. Right foot pain. Pt has bulging discs at L2-3, L3-4, L4-5 and L5-S1. Pt will continue to have back and leg pain once antibiotics are complete. Continue pain medicine.   COPD: w/o exacerbation.  Continue on bronchodilators    DM2: well controlled HbA1c 5.2.   Patient condition stable, on scheduled dialysis.  Currently no discharge options.    Subjective: Patient has no complaint.  Physical Exam: Vitals:   12/26/22 1758 12/26/22 1954 12/27/22 0359 12/27/22 0903  BP: (!) 160/73 119/63 102/68 (!) 100/59  Pulse: 92 91 89 92  Resp: 17 18 18 18   Temp: 99.1 F (37.3 C) 97.9 F (36.6 C) 98.3 F (36.8 C) 97.6 F (36.4 C)  TempSrc:      SpO2: 97% 96% 95% 94%  Weight:      Height:  General exam: Appears calm and comfortable  Respiratory system: Clear to auscultation. Respiratory effort normal. Cardiovascular system: S1 & S2 heard, RRR. No JVD, murmurs, rubs, gallops or clicks. No pedal edema. Gastrointestinal system: Abdomen is nondistended, soft and nontender. No organomegaly or masses felt. Normal bowel sounds heard. Central nervous system: Alert and oriented x1. No focal neurological  deficits. Extremities: Symmetric 5 x 5 power. Skin: No rashes, lesions or ulcers Psychiatry: Flat affect.    Data Reviewed:  There are no new results to review at this time.  Family Communication: None  Disposition: Status is: Inpatient Remains inpatient appropriate because: No discharge option.     Time spent: 25 minutes  Author: Marrion Coy, MD 12/27/2022 10:14 AM  For on call review [email or web address].

## 2022-12-28 DIAGNOSIS — M4646 Discitis, unspecified, lumbar region: Secondary | ICD-10-CM | POA: Diagnosis not present

## 2022-12-28 DIAGNOSIS — E440 Moderate protein-calorie malnutrition: Secondary | ICD-10-CM | POA: Diagnosis not present

## 2022-12-28 DIAGNOSIS — N186 End stage renal disease: Secondary | ICD-10-CM | POA: Diagnosis not present

## 2022-12-28 DIAGNOSIS — M79604 Pain in right leg: Secondary | ICD-10-CM | POA: Diagnosis not present

## 2022-12-28 LAB — RENAL FUNCTION PANEL
Albumin: 2.7 g/dL — ABNORMAL LOW (ref 3.5–5.0)
Anion gap: 11 (ref 5–15)
BUN: 40 mg/dL — ABNORMAL HIGH (ref 8–23)
CO2: 27 mmol/L (ref 22–32)
Calcium: 8.2 mg/dL — ABNORMAL LOW (ref 8.9–10.3)
Chloride: 100 mmol/L (ref 98–111)
Creatinine, Ser: 6.75 mg/dL — ABNORMAL HIGH (ref 0.44–1.00)
GFR, Estimated: 6 mL/min — ABNORMAL LOW (ref >60)
Glucose, Bld: 111 mg/dL — ABNORMAL HIGH (ref 70–99)
Phosphorus: 2.5 mg/dL (ref 2.5–4.6)
Potassium: 4.1 mmol/L (ref 3.5–5.1)
Sodium: 138 mmol/L (ref 135–145)

## 2022-12-28 LAB — CBC
HCT: 32.3 % — ABNORMAL LOW (ref 36.0–46.0)
Hemoglobin: 9.8 g/dL — ABNORMAL LOW (ref 12.0–15.0)
MCH: 26.3 pg (ref 26.0–34.0)
MCHC: 30.3 g/dL (ref 30.0–36.0)
MCV: 86.6 fL (ref 80.0–100.0)
Platelets: 217 10*3/uL (ref 150–400)
RBC: 3.73 MIL/uL — ABNORMAL LOW (ref 3.87–5.11)
RDW: 18.7 % — ABNORMAL HIGH (ref 11.5–15.5)
WBC: 7.1 10*3/uL (ref 4.0–10.5)
nRBC: 0 % (ref 0.0–0.2)

## 2022-12-28 MED ORDER — EPOETIN ALFA 10000 UNIT/ML IJ SOLN
INTRAMUSCULAR | Status: AC
  Filled 2022-12-28: qty 1

## 2022-12-28 MED ORDER — PROSOURCE PLUS PO LIQD
30.0000 mL | Freq: Three times a day (TID) | ORAL | Status: DC
  Administered 2022-12-28 – 2023-01-11 (×25): 30 mL via ORAL
  Filled 2022-12-28 (×40): qty 30

## 2022-12-28 MED ORDER — HEPARIN SODIUM (PORCINE) 1000 UNIT/ML DIALYSIS
25.0000 [IU]/kg | INTRAMUSCULAR | Status: DC | PRN
  Administered 2022-12-28: 1700 [IU] via INTRAVENOUS_CENTRAL
  Filled 2022-12-28: qty 2

## 2022-12-28 MED ORDER — HEPARIN SODIUM (PORCINE) 1000 UNIT/ML IJ SOLN
INTRAMUSCULAR | Status: AC
  Filled 2022-12-28: qty 10

## 2022-12-28 NOTE — Progress Notes (Signed)
Progress Note   [name] ZOX:[id number] DOB: 01/06/1951 DOA: 10/12/2022     77 DOS: the patient was seen and examined on 12/28/2022   Brief hospital course: Cynthia Dean is a 72 y.o. 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 She presents to the ED with worsening lower extremity pain. Admitted for possible Discitis/osteomyelitis at L4-L5: w/ suspected intradiscal abscess as per MRI. 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   4/18, HD access / fistula clotted off during dialysis.  4/19 vascular placed R femoral temp HD cath placed, resumed HD and pemcath placed 4/24.  5/14- back pain seems to be well-controlled.  Caseworkers have discussed the case, involved ethics committee/Hospital administration. Looking into home dialysis.  Lacks capacity to make informed decision.   Assessment and Plan:  Right knee pain due to osteoarthritis. Plain right knee pain, x-ray showed mild effusion with osteoarthritis. 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   Wound cx grew staph capitis.   ID consulted. Not a good surgical candidate as per neuro surg.   Pt having severe low back and bilateral leg pain and has not been able to tolerate any position except laying nearly flat in the bed S/p cefazolin, for 6 weeks, End Date: 12/02/22 Continue symptomatic treatment with pain medicine (Duragesic patch, Fentanyl IV prn, lidocaine prn while at HD and as needed acetaminophen).   Patient still on oral Keflex for additional 4 weeks.   Confusion and agitation - improved Hospital delirium  Improved.    ESRD: on HD MWF.  Anemia of chronic kidney disease. Hypokalemia Continue dialysis per nephrology.   Generalized weakness / Physical Debility:  Not able to place.   Severe back and bilateral leg pain due to severe multi-level lumbar degenerative disc disease. Peripheral neuropathy. Right foot pain. Pt has bulging discs at L2-3, L3-4, L4-5 and L5-S1. Pt will continue to have back and leg pain once antibiotics are complete. Continue pain medicine as above.   COPD: w/o exacerbation.  Continue on bronchodilators    DM2: well controlled HbA1c 5.2.    Patient condition stable, on scheduled dialysis.  Currently no discharge options. TOC aware and working on placement. Patient is not able to sit in the chair to be able to go for outpt HD     Subjective: no new issues  Physical Exam: Vitals:   12/28/22 1230 12/28/22 1249 12/28/22 1306 12/28/22 1954  BP: 91/60 118/61  (!) 103/52  Pulse: (!) 101 98  100  Resp: 11 14  18   Temp:  98 F (36.7 C)  98.4 F (36.9 C)  TempSrc:  Oral  Oral  SpO2: 96%   98%  Weight:   85.3 kg   Height:       General exam: Appears calm and comfortable  Respiratory system: Clear to auscultation. Respiratory effort normal. Cardiovascular system: S1 & S2 heard, RRR. No JVD, murmurs, rubs, gallops or clicks. No pedal edema. Gastrointestinal system: Abdomen is nondistended, soft and nontender. No organomegaly or masses felt. Normal bowel sounds heard. Central nervous system: Alert and oriented x1. No focal neurological deficits. Extremities: Symmetric 5 x 5 power. Skin: No rashes, lesions or  ulcers Psychiatry: Flat affect.  Data Reviewed:  Hb 9.8  Family Communication: I called both daughters phone but no response  Disposition: Status is: Inpatient Remains inpatient appropriate because: Ongoing dialysis needs.  Waiting for placement  Planned Discharge Destination: Skilled nursing facility   DVT prophylaxis-heparin Time spent: 35  minutes  Author: Delfino Lovett, MD 12/28/2022 8:10 PM  For on call review [email or web address].

## 2022-12-28 NOTE — Progress Notes (Signed)
  Received patient in bed to unit.   Informed consent signed and in chart.    TX duration:3.5hrs     Transported back to floor  Hand-off given to patient's nurse. No c/o and no distress noted    Access used: L HD catheter Access issues: none   Total UF removed: 0.5L Medication(s) given: 10,000u epogen Post HD VS: 118/60 Post HD weight: 85.3kg     Lynann Beaver  [facility]

## 2022-12-28 NOTE — Progress Notes (Signed)
Nutrition Follow-up  DOCUMENTATION CODES:   Non-severe (moderate) malnutrition in context of chronic illness  INTERVENTION:   -Continue renal MVI daily -Increase 30 ml Prosource Plus to TID, each supplement provides 100 kcals and 15 grams protein -Continue Nepro Shake po TID, each supplement provides 425 kcal and 19 grams protein  -Liberalize diet to regular for widest variety of meal selections.  -Continue feeding assistance with meals   NUTRITION DIAGNOSIS:   Moderate Malnutrition related to chronic illness (ESRD on HD) as evidenced by percent weight loss, mild muscle depletion, moderate muscle depletion, edema.  Ongoing  GOAL:   Patient will meet greater than or equal to 90% of their needs  Progressing   MONITOR:   PO intake, Supplement acceptance  REASON FOR ASSESSMENT:   Malnutrition Screening Tool    ASSESSMENT:   Pt with PMH of ESRD on HD, HLD, COPD, DM2, HTN, depression, CVA who presents with back pain x 3 weeks PTA.  Reviewed I/O's: +479 ml x 24 hours and +406 ml since 12/14/22   Per psych eval pt unable to make an informed decision regarding decision to stop HD treatment or refuse HD treatment. TOC attempting to contact HCPOA for further goals of care discussions and plans of possibility of home HD.   Pt with erratic oral intake. Noted meal completions 15-100%. Pt also with variable acceptance of supplements.   Reviewed wt hx; wt has been stable over the past week.   Medications reviewed and include sensipar, epogen, keppra, metamucil, and renvela.   Labs reviewed: CBGS: 131.    Diet Order:   Diet Order             Diet 2 gram sodium Fluid consistency: Thin  Diet effective now                   EDUCATION NEEDS:   No education needs have been identified at this time  Skin:  Skin Assessment: Skin Integrity Issues: Skin Integrity Issues:: Other (Comment) Other: IAD to lt buttocks  Last BM:  12/28/22 (type 4)  Height:   Ht Readings from  Last 1 Encounters:  10/12/22 6' (1.829 m)    Weight:   Wt Readings from Last 1 Encounters:  12/28/22 85.9 kg    Ideal Body Weight:  72.7 kg  BMI:  Body mass index is 25.68 kg/m.  Estimated Nutritional Needs:   Kcal:  2150-2350  Protein:  105-120 grams  Fluid:  1000 ml + UOP    Levada Schilling, RD, LDN, CDCES Registered Dietitian II Certified Diabetes Care and Education Specialist Please refer to [facility] for RD and/or RD on-call/weekend/after hours pager

## 2022-12-28 NOTE — Progress Notes (Signed)
[facility]  PROGRESS NOTE   Subjective:   Patient seen and evaluated during dialysis.   HEMODIALYSIS FLOWSHEET:  Blood Flow Rate (mL/min): 400 mL/min Arterial Pressure (mmHg): -230 mmHg Venous Pressure (mmHg): 210 mmHg TMP (mmHg): 6 mmHg Ultrafiltration Rate (mL/min): 338 mL/min Dialysate Flow Rate (mL/min): 300 ml/min Dialysis Fluid Bolus: Normal Saline Bolus Amount (mL): 300 mL  Tolerating treatment well   Objective:  Vital signs: Blood pressure 102/79, pulse 98, temperature 97.6 F (36.4 C), temperature source Oral, resp. rate 15, height 6' (1.829 m), weight 85.9 kg, SpO2 97 %.  Intake/Output Summary (Last 24 hours) at 12/28/2022 1239 Last data filed at 12/27/2022 2310 Gross per 24 hour  Intake 360 ml  Output 1 ml  Net 359 ml    Filed Weights   12/26/22 1308 12/26/22 1751 12/28/22 0918  Weight: 69.7 kg 69.2 kg 85.9 kg     Physical Exam: General:  No acute distress, laying in bed  Head:  Normocephalic, atraumatic. Moist oral mucosal membranes  Eyes:  Anicteric  Lungs:   Clear to auscultation, normal effort  Heart:  Irregular  Abdomen:   Soft, nontender, bowel sounds present  Extremities:  No peripheral edema.  Neurologic:  Alert and oriented to self and place  Skin:  No lesions  Access:  LIJ permcath    Basic Metabolic Panel: Recent Labs  Lab 12/23/22 0431 12/26/22 1300 12/28/22 0931  NA 134* 136 138  K 3.9 4.3 4.1  CL 98 97* 100  CO2 26 26 27   GLUCOSE 114* 125* 111*  BUN 29* 55* 40*  CREATININE 5.80* 8.13* 6.75*  CALCIUM 10.5* 8.5* 8.2*  PHOS  --  3.6 2.5    GFR: Estimated Creatinine Clearance: 8.8 mL/min (A) (by C-G formula based on SCr of 6.75 mg/dL (H)).  Liver Function Tests: Recent Labs  Lab 12/26/22 1300 12/28/22 0931  ALBUMIN 2.6* 2.7*    No results for input(s): "LIPASE", "AMYLASE" in the last 168 hours. No results for input(s): "AMMONIA" in the last 168 hours.  CBC: Recent Labs  Lab 12/26/22 1300  12/28/22 0931  WBC 5.4 7.1  HGB 9.6* 9.8*  HCT 32.3* 32.3*  MCV 87.3 86.6  PLT 219 217      HbA1C: Hemoglobin A1C  Date/Time Value Ref Range Status  06/15/2014 12:58 AM 7.9 (H) 4.2 - 6.3 % Final    Comment:    The American Diabetes Association recommends that a primary goal of therapy should be <7% and that physicians should reevaluate the treatment regimen in patients with HbA1c values consistently >8%.   12/24/2013 11:18 AM 8.2 (H) 4.2 - 6.3 % Final    Comment:    The American Diabetes Association recommends that a primary goal of therapy should be <7% and that physicians should reevaluate the treatment regimen in patients with HbA1c values consistently >8%.    Hgb A1c MFr Bld  Date/Time Value Ref Range Status  10/13/2022 11:46 AM 5.2 4.8 - 5.6 % Final    Comment:    (NOTE)         Prediabetes: 5.7 - 6.4         Diabetes: >6.4         Glycemic control for adults with diabetes: <7.0   03/23/2022 06:47 AM 5.8 (H) 4.8 - 5.6 % Final    Comment:    (NOTE) Pre diabetes:          5.7%-6.4%  Diabetes:              >  6.4%  Glycemic control for   <7.0% adults with diabetes     Urinalysis: No results for input(s): "COLORURINE", "LABSPEC", "PHURINE", "GLUCOSEU", "HGBUR", "BILIRUBINUR", "KETONESUR", "PROTEINUR", "UROBILINOGEN", "NITRITE", "LEUKOCYTESUR" in the last 72 hours.  Invalid input(s): "APPERANCEUR"    Imaging: No results found.   Medications:    sodium chloride Stopped (11/16/22 1155)   anticoagulant sodium citrate      (feeding supplement) PROSource Plus  30 mL Oral TID BM   cephALEXin  500 mg Oral QHS   Chlorhexidine Gluconate Cloth  6 each Topical Q0600   cinacalcet  60 mg Oral Q supper   epoetin (EPOGEN/PROCRIT) injection  10,000 Units Intravenous Q M,W,F-HD   feeding supplement (NEPRO CARB STEADY)  237 mL Oral TID BM   fentaNYL  1 patch Transdermal Q72H   gabapentin  100 mg Oral Once per day on Mon Wed Fri   heparin injection (subcutaneous)   5,000 Units Subcutaneous Q8H   levETIRAcetam  1,000 mg Oral Daily   levETIRAcetam  250 mg Oral Q M,W,F   lidocaine  1 patch Transdermal Q24H   multivitamin  1 tablet Oral QHS   pantoprazole  40 mg Oral BID   psyllium  1 packet Oral Daily   QUEtiapine  25 mg Oral QHS   sertraline  25 mg Oral Daily   sevelamer carbonate  2,400 mg Oral TID WC   sodium chloride flush  10-40 mL Intracatheter Q12H   umeclidinium-vilanterol  1 puff Inhalation Daily    Assessment/ Plan:     Ms. Syeeda Godard is a 72 y.o. female with end stage renal disease on hemodialysis, CAD, COPD with intermittent home O2, hypertension, hyperlipidemia, stroke, diabetes who has been admitted since 3/20. Initially admitted for vertebral osteomyelitis. She has completed IV antibiotics. Now is working on placement.   Due to extended hospitalization, patient will need outpatient dialysis clinic re-initiated.   [facility] permcath   #1 ESRD:  -Dialysis today, UF goal 0.5L as tolerated.  - Next treatment scheduled for Wednesday   #2: Anemia with chronic kidney disease: hemoglobin 9.8 - EPO with dialysis   #3: Second hyperparathyroidism: With hypercalcemia Lab Results  Component Value Date   PTH 157 (H) 12/08/2022   CALCIUM 8.2 (L) 12/28/2022   CAION 1.09 (L) 08/28/2022   PHOS 2.5 12/28/2022   -Calcium corrected with Zolmeta 4mg  IV on 12/22/2022 -Continue Cinacalcet to 60 mg daily. - Continue sevelamer with meals.    #4: Hypertension Not currently on any blood pressure agents. Blood pressure 91/60 during dialysis    LOS: [address] [facility] 6/5/202412:39 PM

## 2022-12-29 DIAGNOSIS — M79604 Pain in right leg: Secondary | ICD-10-CM | POA: Diagnosis not present

## 2022-12-29 DIAGNOSIS — E440 Moderate protein-calorie malnutrition: Secondary | ICD-10-CM | POA: Diagnosis not present

## 2022-12-29 DIAGNOSIS — K6812 Psoas muscle abscess: Secondary | ICD-10-CM | POA: Diagnosis not present

## 2022-12-29 DIAGNOSIS — G40909 Epilepsy, unspecified, not intractable, without status epilepticus: Secondary | ICD-10-CM | POA: Diagnosis not present

## 2022-12-29 LAB — CBC
HCT: 33.7 % — ABNORMAL LOW (ref 36.0–46.0)
Hemoglobin: 10.2 g/dL — ABNORMAL LOW (ref 12.0–15.0)
MCH: 25.8 pg — ABNORMAL LOW (ref 26.0–34.0)
MCHC: 30.3 g/dL (ref 30.0–36.0)
MCV: 85.3 fL (ref 80.0–100.0)
Platelets: 182 10*3/uL (ref 150–400)
RBC: 3.95 MIL/uL (ref 3.87–5.11)
RDW: 18.7 % — ABNORMAL HIGH (ref 11.5–15.5)
WBC: 6.2 10*3/uL (ref 4.0–10.5)
nRBC: 0 % (ref 0.0–0.2)

## 2022-12-29 LAB — BASIC METABOLIC PANEL
Anion gap: 11 (ref 5–15)
BUN: 20 mg/dL (ref 8–23)
CO2: 27 mmol/L (ref 22–32)
Calcium: 8.2 mg/dL — ABNORMAL LOW (ref 8.9–10.3)
Chloride: 98 mmol/L (ref 98–111)
Creatinine, Ser: 4.44 mg/dL — ABNORMAL HIGH (ref 0.44–1.00)
GFR, Estimated: 10 mL/min — ABNORMAL LOW (ref >60)
Glucose, Bld: 98 mg/dL (ref 70–99)
Potassium: 3.6 mmol/L (ref 3.5–5.1)
Sodium: 136 mmol/L (ref 135–145)

## 2022-12-29 MED ORDER — HEPARIN SODIUM (PORCINE) 1000 UNIT/ML DIALYSIS
25.0000 [IU]/kg | INTRAMUSCULAR | Status: DC | PRN
  Administered 2022-12-30: 2100 [IU] via INTRAVENOUS_CENTRAL
  Filled 2022-12-29: qty 3

## 2022-12-29 NOTE — TOC Progression Note (Signed)
Transition of Care [facility]) - Progression Note    Patient Details  Name: Cynthia Dean MRN: [id number] Date of Birth: [birth year]  [facility] CM/SW Contact  [name], [place] Phone Number: 12/28/2022, 11:30 am  Clinical Narrative:    CSW attempted to speak to APS to discuss patient's status and who can make decisions for her, however CSW was not able to get through to APS.  Per Dimas Chyle dialysis coordinator and previous [facility] worker, patient's daughter [name] has not been returning phone calls.  Per dialysis coordinator, she was trying to coordinate with daughter [name] to set up home dialysis.  [name] is the [employer], psych states patient does not have the capacity to make decisions.  Patient still unable to tolerate sitting up for dialysis.  Due to patient not being able to sit up for dialysis, TOC has been unable to find a SNF that can accept patient.  TOC to try APS again tomorrow to discuss plan of action.  TOC to continue to follow patient throughout discharge planning.        Expected Discharge Plan and Services    Unknown at this time.                                           Social Determinants of Health (SDOH) Interventions SDOH Screenings   Food Insecurity: No Food Insecurity (10/12/2022)  Housing: Low Risk  (10/12/2022)  Transportation Needs: No Transportation Needs (10/12/2022)  Utilities: Not At Risk (10/12/2022)  Financial Resource Strain: Low Risk  (11/12/2017)  Physical Activity: Insufficiently Active (11/12/2017)  Social Connections: Moderately Integrated (11/12/2017)  Stress: No Stress Concern Present (11/12/2017)  Tobacco Use: Medium Risk (11/16/2022)    Readmission Risk Interventions     No data to display

## 2022-12-29 NOTE — Discharge Planning (Signed)
Attempted calling Cynthia Dean (HPOA) this morning to discuss home hemo options. No answer, left VM.

## 2022-12-29 NOTE — Progress Notes (Signed)
Progress Note   [name] ZOX:[id number] DOB: [birth year] DOA: 10/12/2022     78 DOS: the patient was seen and examined on 12/29/2022   Brief hospital course: Cynthia Dean is a 72 y.o. 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 She presents to the ED with worsening lower extremity pain. Admitted for possible Discitis/osteomyelitis at L4-L5: w/ suspected intradiscal abscess as per MRI. 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   4/18, HD access / fistula clotted off during dialysis.  4/19 vascular placed R femoral temp HD cath placed, resumed HD and pemcath placed 4/24.  5/14- back pain seems to be well-controlled.  Caseworkers have discussed the case, involved ethics committee/Hospital administration. Looking into home dialysis.  Lacks capacity to make informed decision.   Assessment and Plan:  Cynthia Dean is a 72 y.o. 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 She presents to the ED with worsening lower extremity pain. Admitted for possible Discitis/osteomyelitis at L4-L5: w/ suspected intradiscal abscess as per MRI. 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    4/18, HD access / fistula clotted off during dialysis.  4/19 vascular placed R femoral temp HD cath placed, resumed HD and pemcath placed 4/24.   5/14- back pain seems to be well-controlled.  Caseworkers have discussed the case, involved ethics committee/Hospital administration. Looking into home dialysis.  Lacks capacity to make informed decision.   6/5 - 6/6: Patient continues to refuse taking medications.  She wants [place] to leave  her alone.  Unable to get in touch with family to coordinate discharge planning and or dialysis options   Assessment and Plan:   Right knee pain due to osteoarthritis. Plain right knee pain, x-ray showed mild effusion with osteoarthritis. 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   Wound cx grew staph capitis.   ID consulted. Not a good surgical candidate as per neuro surg.   Pt having severe low back and bilateral leg pain and has not been able to tolerate any position except laying nearly flat in the bed S/p cefazolin, for 6 weeks, End Date: 12/02/22 Continue symptomatic treatment with pain medicine (Duragesic patch, Fentanyl IV prn, lidocaine prn while at HD and as needed acetaminophen).   Patient still on oral Keflex for additional 4 weeks.   Confusion and agitation - improved Hospital delirium  Improved.   ESRD: on HD MWF.  Anemia of chronic kidney disease. Hypokalemia Continue dialysis per nephrology.   Generalized weakness / Physical Debility:  Not able to place.   Severe back and bilateral leg pain due to severe multi-level lumbar degenerative disc disease. Peripheral neuropathy. Right foot pain. Pt has bulging discs at L2-3, L3-4, L4-5 and L5-S1. Pt will continue to have back and leg pain once antibiotics are complete. Continue pain medicine as above.   COPD: w/o exacerbation.  Continue on bronchodilators    DM2: well controlled HbA1c 5.2.    Patient condition stable, on scheduled dialysis.  Currently no discharge options. TOC aware and working on placement. Patient is not able  to sit in the chair to be able to go for outpt HD    Subjective: She was sleepy when I saw her but wakes up to deep pain stimuli  Physical Exam: Vitals:   12/28/22 1249 12/28/22 1306  12/28/22 1954 12/29/22 0453  BP: 118/61  (!) 103/52 (!) 105/27  Pulse: 98  100 84  Resp: 14  18 20   Temp: 98 F (36.7 C)  98.4 F (36.9 C) 98.5 F (36.9 C)  TempSrc: Oral  Oral   SpO2:   98% 100%  Weight:  85.3 kg    Height:       General exam: Appears calm and comfortable  Respiratory system: Clear to auscultation. Respiratory effort normal. Cardiovascular system: S1 & S2 heard, RRR. No JVD, murmurs, rubs, gallops or clicks. No pedal edema. Gastrointestinal system: Abdomen is nondistended, soft and nontender. No organomegaly or masses felt. Normal bowel sounds heard. Central nervous system: She is sleepy. No focal neurological deficits.  Wakes up to deep painful stimuli Extremities: Symmetric 5 x 5 power. Skin: No rashes, lesions or ulcers Psychiatry: Flat affect.  Data Reviewed:  There are no new results to review at this time.  Family Communication: Unable to get in touch with family.  I had tried calling both daughters phone without any luck  Disposition: Status is: Inpatient Remains inpatient appropriate because: Difficult disposition  Planned Discharge Destination: Home with Home Health versus SNF   DVT prophylaxis-heparin Time spent: 15 minutes  Author: Delfino Lovett, MD 12/29/2022 1:08 PM  For on call review [email or web address].

## 2022-12-30 DIAGNOSIS — D638 Anemia in other chronic diseases classified elsewhere: Secondary | ICD-10-CM | POA: Diagnosis not present

## 2022-12-30 DIAGNOSIS — M5442 Lumbago with sciatica, left side: Secondary | ICD-10-CM | POA: Diagnosis not present

## 2022-12-30 DIAGNOSIS — K6812 Psoas muscle abscess: Secondary | ICD-10-CM | POA: Diagnosis not present

## 2022-12-30 DIAGNOSIS — E440 Moderate protein-calorie malnutrition: Secondary | ICD-10-CM | POA: Diagnosis not present

## 2022-12-30 LAB — CBC
HCT: 34.2 % — ABNORMAL LOW (ref 36.0–46.0)
Hemoglobin: 10.3 g/dL — ABNORMAL LOW (ref 12.0–15.0)
MCH: 26.3 pg (ref 26.0–34.0)
MCHC: 30.1 g/dL (ref 30.0–36.0)
MCV: 87.2 fL (ref 80.0–100.0)
Platelets: 238 10*3/uL (ref 150–400)
RBC: 3.92 MIL/uL (ref 3.87–5.11)
RDW: 18.9 % — ABNORMAL HIGH (ref 11.5–15.5)
WBC: 6.1 10*3/uL (ref 4.0–10.5)
nRBC: 0 % (ref 0.0–0.2)

## 2022-12-30 LAB — RENAL FUNCTION PANEL
Albumin: 2.9 g/dL — ABNORMAL LOW (ref 3.5–5.0)
Anion gap: 14 (ref 5–15)
BUN: 31 mg/dL — ABNORMAL HIGH (ref 8–23)
CO2: 26 mmol/L (ref 22–32)
Calcium: 8.3 mg/dL — ABNORMAL LOW (ref 8.9–10.3)
Chloride: 98 mmol/L (ref 98–111)
Creatinine, Ser: 6.74 mg/dL — ABNORMAL HIGH (ref 0.44–1.00)
GFR, Estimated: 6 mL/min — ABNORMAL LOW (ref >60)
Glucose, Bld: 132 mg/dL — ABNORMAL HIGH (ref 70–99)
Phosphorus: 2.5 mg/dL (ref 2.5–4.6)
Potassium: 3.8 mmol/L (ref 3.5–5.1)
Sodium: 138 mmol/L (ref 135–145)

## 2022-12-30 MED ORDER — HEPARIN SODIUM (PORCINE) 1000 UNIT/ML IJ SOLN
INTRAMUSCULAR | Status: AC
  Filled 2022-12-30: qty 10

## 2022-12-30 MED ORDER — FENTANYL CITRATE PF 50 MCG/ML IJ SOSY
PREFILLED_SYRINGE | INTRAMUSCULAR | Status: AC
  Filled 2022-12-30: qty 1

## 2022-12-30 MED ORDER — EPOETIN ALFA 10000 UNIT/ML IJ SOLN
INTRAMUSCULAR | Status: AC
  Filled 2022-12-30: qty 1

## 2022-12-30 NOTE — TOC Progression Note (Signed)
Transition of Care [facility]) - Progression Note    Patient Details  Name: Cynthia Dean MRN: [id number] Date of Birth: 07/14/1951  Transition of [facility]) CM/SW Contact  Cynthia Dean, [place] Phone Number: 12/30/2022, 6:42 PM  Clinical Narrative:     CSW contacted Cynthia Dean APS and spoke to [place] to make an APS report due to patient's daughter Cynthia Dean who is the HCPOA has not been answering calls from the Dean and per psych patient does not have capacity to make decisions.    DSS sent APS worker Cynthia Dean out to visit patient, this CSW and previous TOC member Cynthia Dean gave details to APS worker Cynthia Dean that when the patient was alert she had said she did not want to continue with dialysis and wanted hospice, however patient's daughter Cynthia Dean talked to patient and convinced her to change her and patient said she would continue dialysis.  Cynthia Dean, dialysis coordinator was talking to patient and her family and was working on trying to get home dialysis set up about a month ago, however patient's daughter Cynthia Dean did not want to be involved in trying to set it up.  Cynthia Dean has been unable to get a hold of patient's family since.   Multiple times staff have talked to patient without her family present and she stated she did not want to continue on with dialysis and wanted hospice.  Patient has not been able to tolerate sitting up for dialysis due to pain, even when physicians provide patient with pain medication.  Palliative has been following patient as well, in April patient was ready for hospice, but then her daughter convinced her to change her mind.  Patient has another daughter named Cynthia Dean, who Cynthia Dean patient's HCPOA told the Dean not to speak to her sister regarding patient's care.  Cynthia Dean has not been to visit patient since May, and multiple people have attempted to contact patient's daughter Cynthia Dean but no answer or family communication.  TOC requested ethics consult to assist with the situation due  to patient not having a decision maker present.  Per progress notes, the past couple of days patient has said she does not want her medications and have told the staff on the unit to just leave her alone.  TOC to continue to follow patient's progress throughout discharge planning.        Expected Discharge Plan and Services  Unknown, APS involved and following patient's care.                                             Social Determinants of Health (SDOH) Interventions SDOH Screenings   Food Insecurity: No Food Insecurity (10/12/2022)  Housing: Low Risk  (10/12/2022)  Transportation Needs: No Transportation Needs (10/12/2022)  Utilities: Not At Risk (10/12/2022)  Financial Resource Strain: Low Risk  (11/12/2017)  Physical Activity: Insufficiently Active (11/12/2017)  Social Connections: Moderately Integrated (11/12/2017)  Stress: No Stress Concern Present (11/12/2017)  Tobacco Use: Medium Risk (11/16/2022)    Readmission Risk Interventions     No data to display

## 2022-12-30 NOTE — Progress Notes (Signed)
Patient returned from dialysis in stable condition via bed

## 2022-12-30 NOTE — Progress Notes (Signed)
Progress Note   [name] XBJ:[id number] DOB: 1951-01-06 DOA: 10/12/2022     79 DOS: the patient was seen and examined on 12/30/2022   Brief hospital course: [name] is a 72 y.o. 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 She presents to the ED with worsening lower extremity pain. Admitted for possible Discitis/osteomyelitis at L4-L5: w/ suspected intradiscal abscess as per MRI. 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   4/18, HD access / fistula clotted off during dialysis.  4/19 vascular placed R femoral temp HD cath placed, resumed HD and pemcath placed 4/24.  5/14- back pain seems to be well-controlled.  Caseworkers have discussed the case, involved ethics committee/Hospital administration. Looking into home dialysis.  Lacks capacity to make informed decision.   Assessment and Plan:  Right knee pain due to osteoarthritis. Plain right knee pain, x-ray showed mild effusion with osteoarthritis. 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   Wound cx grew staph capitis.   ID consulted. Not a good surgical candidate as per neuro surg.   Pt having severe low back and bilateral leg pain and has not been able to tolerate any position except laying nearly flat in the bed S/p cefazolin, for 6 weeks, completed: 12/02/22 Continue symptomatic treatment with pain medicine (Duragesic patch, Fentanyl IV prn, lidocaine prn while at HD and as needed acetaminophen).   Patient still on oral Keflex for 28 doses starting 12/05/2022   Confusion and agitation - improved Hospital delirium   Improved.   ESRD: on HD MWF.  Anemia of chronic kidney disease. Hypokalemia Continue dialysis per nephrology.   Generalized weakness / Physical Debility:  Not able to place.   Severe back and bilateral leg pain due to severe multi-level lumbar degenerative disc disease. Peripheral neuropathy. Right foot pain. Pt has bulging discs at L2-3, L3-4, L4-5 and L5-S1. Pt will continue to have back and leg pain once antibiotics are complete. Continue pain medicine as above.   COPD: w/o exacerbation.  Continue on bronchodilators    DM2: well controlled HbA1c 5.2.    Patient condition stable, on scheduled dialysis.  Currently no discharge options. TOC aware and working on placement. Patient is not able to sit in the chair to be able to go for outpt HD     Subjective: No new issues  Physical Exam: Vitals:   12/29/22 1610 12/29/22 1934 12/30/22 0442 12/30/22 0758  BP: (!) 118/55 118/67 115/61 104/61  Pulse: 99 99 91 94  Resp: 16 20 18 16   Temp: 98.4 F (36.9 C) 98.4 F (36.9 C) (!) 97.5 F (36.4 C) 97.9 F (36.6 C)  TempSrc:   Oral   SpO2: 93% 95% 98% 99%  Weight:      Height:       General exam: Appears calm and comfortable  Respiratory system: Clear to auscultation. Respiratory effort normal. Cardiovascular system: S1 & S2 heard, RRR. No JVD, murmurs, rubs, gallops or clicks. No pedal edema. Gastrointestinal system: Abdomen is nondistended, soft and nontender. No organomegaly or masses felt. Normal bowel sounds heard. Central nervous system: She is sleepy. No focal neurological deficits.  Wakes up to deep painful stimuli  Extremities: Symmetric 5 x 5 power. Skin: No rashes, lesions or ulcers Psychiatry: Flat affect.  Data Reviewed:  There are no new results to review at this time.  Family Communication: None at bedside, unable to reach via phone  Disposition: Status is: Inpatient Remains inpatient appropriate because: Getting dialysis.  Difficult disposition   Planned Discharge Destination: Skilled nursing facility   DVT prophylaxis-heparin Time spent: 15 minutes  Author: Delfino Lovett, MD 12/30/2022 11:46 AM  For on call review [email or web address].

## 2022-12-30 NOTE — Progress Notes (Addendum)
Hemodialysis note  Received patient in bed to unit. .Alert and oriented to self only.  Informed consent signed and in chart.  Patient's HD Catheter is exposed, without dressings. Assessment done and dressings applied per policy. Floor RN and Nephrology MD and NP were notified.   Treatment initiated: 1400 Treatment completed: 1735  Patient tolerated well. Transported back to room, alert without acute distress.  Report given to patient's RN.   Access used: Left chest HD PermCatheter Access issues: High arterial pressures. Lines were switched during HD.  Total UF removed: 500 ml Medication(s) given:  Fentanyl 12.5 mcg IV and Epogen 10 000 units  Post HD weight: 83 kg   [facility]

## 2022-12-30 NOTE — Progress Notes (Signed)
[facility]  PROGRESS NOTE   Subjective:   Patient resting quietly in bed No family at present  Scheduled for dialysis later today   Objective:  Vital signs: Blood pressure 104/61, pulse 94, temperature 97.9 F (36.6 C), resp. rate 16, height 6' (1.829 m), weight 85.3 kg, SpO2 99 %.  Intake/Output Summary (Last 24 hours) at 12/30/2022 1014 Last data filed at 12/29/2022 1948 Gross per 24 hour  Intake 440 ml  Output --  Net 440 ml    Filed Weights   12/26/22 1751 12/28/22 0918 12/28/22 1306  Weight: 69.2 kg 85.9 kg 85.3 kg     Physical Exam: General:  No acute distress, laying in bed  Head:  Normocephalic, atraumatic. Moist oral mucosal membranes  Eyes:  Anicteric  Lungs:   Clear to auscultation, normal effort  Heart:  Irregular  Abdomen:   Soft, nontender, bowel sounds present  Extremities:  No peripheral edema.  Neurologic:  Alert and oriented to self and place  Skin:  No lesions  Access:  [facility] permcath    Basic Metabolic Panel: Recent Labs  Lab 12/26/22 1300 12/28/22 0931 12/29/22 0627  NA 136 138 136  K 4.3 4.1 3.6  CL 97* 100 98  CO2 26 27 27   GLUCOSE 125* 111* 98  BUN 55* 40* 20  CREATININE 8.13* 6.75* 4.44*  CALCIUM 8.5* 8.2* 8.2*  PHOS 3.6 2.5  --     GFR: Estimated Creatinine Clearance: 13.4 mL/min (A) (by C-G formula based on SCr of 4.44 mg/dL (H)).  Liver Function Tests: Recent Labs  Lab 12/26/22 1300 12/28/22 0931  ALBUMIN 2.6* 2.7*    No results for input(s): "LIPASE", "AMYLASE" in the last 168 hours. No results for input(s): "AMMONIA" in the last 168 hours.  CBC: Recent Labs  Lab 12/26/22 1300 12/28/22 0931 12/29/22 0627  WBC 5.4 7.1 6.2  HGB 9.6* 9.8* 10.2*  HCT 32.3* 32.3* 33.7*  MCV 87.3 86.6 85.3  PLT 219 217 182      HbA1C: Hemoglobin A1C  Date/Time Value Ref Range Status  06/15/2014 12:58 AM 7.9 (H) 4.2 - 6.3 % Final    Comment:    The American Diabetes Association recommends that a primary goal  of therapy should be <7% and that physicians should reevaluate the treatment regimen in patients with HbA1c values consistently >8%.   12/24/2013 11:18 AM 8.2 (H) 4.2 - 6.3 % Final    Comment:    The American Diabetes Association recommends that a primary goal of therapy should be <7% and that physicians should reevaluate the treatment regimen in patients with HbA1c values consistently >8%.    Hgb A1c MFr Bld  Date/Time Value Ref Range Status  10/13/2022 11:46 AM 5.2 4.8 - 5.6 % Final    Comment:    (NOTE)         Prediabetes: 5.7 - 6.4         Diabetes: >6.4         Glycemic control for adults with diabetes: <7.0   03/23/2022 06:47 AM 5.8 (H) 4.8 - 5.6 % Final    Comment:    (NOTE) Pre diabetes:          5.7%-6.4%  Diabetes:              >6.4%  Glycemic control for   <7.0% adults with diabetes     Urinalysis: No results for input(s): "COLORURINE", "LABSPEC", "PHURINE", "GLUCOSEU", "HGBUR", "BILIRUBINUR", "KETONESUR", "PROTEINUR", "UROBILINOGEN", "NITRITE", "LEUKOCYTESUR" in the last  72 hours.  Invalid input(s): "APPERANCEUR"    Imaging: No results found.   Medications:    sodium chloride Stopped (11/16/22 1155)   anticoagulant sodium citrate      (feeding supplement) PROSource Plus  30 mL Oral TID BM   cephALEXin  500 mg Oral QHS   Chlorhexidine Gluconate Cloth  6 each Topical Q0600   cinacalcet  60 mg Oral Q supper   epoetin (EPOGEN/PROCRIT) injection  10,000 Units Intravenous Q M,W,F-HD   feeding supplement (NEPRO CARB STEADY)  237 mL Oral TID BM   fentaNYL  1 patch Transdermal Q72H   gabapentin  100 mg Oral Once per day on Mon Wed Fri   heparin injection (subcutaneous)  5,000 Units Subcutaneous Q8H   levETIRAcetam  1,000 mg Oral Daily   levETIRAcetam  250 mg Oral Q M,W,F   lidocaine  1 patch Transdermal Q24H   multivitamin  1 tablet Oral QHS   pantoprazole  40 mg Oral BID   psyllium  1 packet Oral Daily   QUEtiapine  25 mg Oral QHS   sertraline  25  mg Oral Daily   sevelamer carbonate  2,400 mg Oral TID WC   sodium chloride flush  10-40 mL Intracatheter Q12H   umeclidinium-vilanterol  1 puff Inhalation Daily    Assessment/ Plan:     [name] is a 72 y.o. female with end stage renal disease on hemodialysis, CAD, COPD with intermittent home O2, hypertension, hyperlipidemia, stroke, diabetes who has been admitted since 3/20. Initially admitted for vertebral osteomyelitis. She has completed IV antibiotics. Now is working on placement.   Due to extended hospitalization, patient will need outpatient dialysis clinic re-initiated.   [facility] TTS [facility] permcath   #1 ESRD:  -Scheduled for dialysis later today   #2: Anemia with chronic kidney disease: hemoglobin 10.2 - Will continue low dose EPO   #3: Second hyperparathyroidism: With hypercalcemia Lab Results  Component Value Date   PTH 157 (H) 12/08/2022   CALCIUM 8.2 (L) 12/29/2022   CAION 1.09 (L) 08/28/2022   PHOS 2.5 12/28/2022   - Zolmeta 4mg  IV on 12/22/2022 - Calcium acceptable  -Continue Cinacalcet to 60 mg daily. - Continue sevelamer with meals.    #4: Hypertension Not currently on any blood pressure agents. Blood pressure stable    LOS: [address] [facility] 6/7/202410:14 AM

## 2022-12-31 DIAGNOSIS — G40909 Epilepsy, unspecified, not intractable, without status epilepticus: Secondary | ICD-10-CM | POA: Diagnosis not present

## 2022-12-31 DIAGNOSIS — E119 Type 2 diabetes mellitus without complications: Secondary | ICD-10-CM | POA: Diagnosis not present

## 2022-12-31 DIAGNOSIS — N186 End stage renal disease: Secondary | ICD-10-CM | POA: Diagnosis not present

## 2022-12-31 DIAGNOSIS — Z992 Dependence on renal dialysis: Secondary | ICD-10-CM | POA: Diagnosis not present

## 2022-12-31 NOTE — Plan of Care (Signed)

## 2022-12-31 NOTE — Progress Notes (Signed)
Progress Note   [name] HQI:[id number] DOB: June 08, 1951 DOA: 10/12/2022     80 DOS: the patient was seen and examined on 12/31/2022   Brief hospital course: Deamber Camela Wich is a 72 y.o. 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 She presents to the ED with worsening lower extremity pain. Admitted for possible Discitis/osteomyelitis at L4-L5: w/ suspected intradiscal abscess as per MRI. 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   4/18, HD access / fistula clotted off during dialysis.  4/19 vascular placed R femoral temp HD cath placed, resumed HD and pemcath placed 4/24.  5/14- back pain seems to be well-controlled.  Caseworkers have discussed the case, involved ethics committee/Hospital administration. Looking into home dialysis.  Lacks capacity to make informed decision.    Principal Problem:   Back pain Active Problems:   Anemia   Bilateral leg pain   ESRD on dialysis [facility])   Essential hypertension   Dyslipidemia   Seizure disorder (HCC)   Diabetic retinopathy without macular edema associated with type 2 diabetes mellitus (HCC)   GERD (gastroesophageal reflux disease)   Diabetic neuropathy (HCC)   Anemia of chronic disease   Insulin-requiring or dependent type II diabetes mellitus (HCC)   Discitis of lumbar region   Myositis   Psoas abscess (HCC)   Malnutrition of moderate degree   Knee osteomyelits, right (HCC)   Assessment and Plan: Right knee pain due to osteoarthritis. Plain right knee pain, x-ray showed mild effusion with osteoarthritis. Continue pain meds as ordered   Lumbar discitis, L4-L5 epidural phlegmon and b/l psoas abscess on MRI S/p IR aspiration of psoas abscess on 10/13/22 Low back pain, Bilateral Leg Pain - due to above Vanco and cefepime on  10/13/22 after procedure.   Wound cx grew staph capitis.   ID consulted. Not a good surgical candidate as per neuro surg.   Pt having severe low back and bilateral leg pain and has not been able to tolerate any position except laying nearly flat in the bed S/p cefazolin, for 6 weeks, completed: 12/02/22 Continue symptomatic treatment with pain medicine (Duragesic patch, Fentanyl IV prn, lidocaine prn while at HD and as needed acetaminophen).   Patient still on oral Keflex for 28 doses starting 12/05/2022   Confusion and agitation - improved Hospital delirium  Improved.   ESRD: on HD MWF.  Anemia of chronic kidney disease. Hypokalemia Continue dialysis per nephrology.   Generalized weakness / Physical Debility:  Not able to place.   Severe back and bilateral leg pain due to severe multi-level lumbar degenerative disc disease. Peripheral neuropathy. Right foot pain. Pt has bulging discs at L2-3, L3-4, L4-5 and L5-S1. Pt will continue to have back and leg pain once antibiotics are complete. Continue pain medicine as above.   COPD: w/o exacerbation.  Continue on bronchodilators    DM2: well controlled HbA1c 5.2.   Patient condition stable, currently no discharge option.    Subjective: No complaint.  Physical Exam: Vitals:   12/30/22 1735 12/30/22 1942 12/31/22 0427 12/31/22 0901  BP: (!) 155/82 129/61 118/64 108/60  Pulse: 93 94 (!) 106 (!) 101  Resp: 18 18 18 18   Temp: 98.1 F (36.7 C) 98.9 F (37.2 C) 98.2 F (36.8 C) 98 F (36.7 C)  TempSrc: Oral  Oral Oral  SpO2: 99% 99% 92% 99%  Weight: 83 kg  Height:       General exam: Appears calm and comfortable  Respiratory system: Clear to auscultation. Respiratory effort normal. Cardiovascular system: S1 & S2 heard, RRR. No JVD, murmurs, rubs, gallops or clicks. No pedal edema. Gastrointestinal system: Abdomen is nondistended, soft and nontender. No organomegaly or masses felt. Normal bowel sounds heard. Central  nervous system: Alert and oriented x2. No focal neurological deficits. Extremities: Symmetric 5 x 5 power. Skin: No rashes, lesions or ulcers Psychiatry: Judgement and insight appear normal. Mood & affect appropriate.    Data Reviewed:  There are no new results to review at this time.  Family Communication: None  Disposition: Status is: Inpatient Remains inpatient appropriate because: No discharge option.     Time spent: 25 minutes  Author: Marrion Coy, MD 12/31/2022 2:13 PM  For on call review [email or web address].

## 2023-01-01 DIAGNOSIS — Z992 Dependence on renal dialysis: Secondary | ICD-10-CM | POA: Diagnosis not present

## 2023-01-01 DIAGNOSIS — N186 End stage renal disease: Secondary | ICD-10-CM | POA: Diagnosis not present

## 2023-01-01 DIAGNOSIS — I10 Essential (primary) hypertension: Secondary | ICD-10-CM | POA: Diagnosis not present

## 2023-01-01 NOTE — Progress Notes (Signed)
Progress Note   [name] GEX:[id number] DOB: 01/27/1951 DOA: 10/12/2022     81 DOS: the patient was seen and examined on 01/01/2023   Brief hospital course: Mickey Falen Bagnall is a 72 y.o. 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 She presents to the ED with worsening lower extremity pain. Admitted for possible Discitis/osteomyelitis at L4-L5: w/ suspected intradiscal abscess as per MRI. 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   4/18, HD access / fistula clotted off during dialysis.  4/19 vascular placed R femoral temp HD cath placed, resumed HD and pemcath placed 4/24.  5/14- back pain seems to be well-controlled.  Caseworkers have discussed the case, involved ethics committee/Hospital administration. Looking into home dialysis.  Lacks capacity to make informed decision.    Principal Problem:   Back pain Active Problems:   Anemia   Bilateral leg pain   ESRD on dialysis [facility])   Essential hypertension   Dyslipidemia   Seizure disorder (HCC)   Diabetic retinopathy without macular edema associated with type 2 diabetes mellitus (HCC)   GERD (gastroesophageal reflux disease)   Diabetic neuropathy (HCC)   Anemia of chronic disease   Insulin-requiring or dependent type II diabetes mellitus (HCC)   Discitis of lumbar region   Myositis   Psoas abscess (HCC)   Malnutrition of moderate degree   Knee osteomyelits, right (HCC)   Assessment and Plan: Right knee pain due to osteoarthritis. Plain right knee pain, x-ray showed mild effusion with osteoarthritis. Continue pain meds as ordered   Lumbar discitis, L4-L5 epidural phlegmon and b/l psoas abscess on MRI S/p IR aspiration of psoas abscess on 10/13/22 Low back pain, Bilateral Leg Pain - due to above Vanco and cefepime on  10/13/22 after procedure.   Wound cx grew staph capitis.   ID consulted. Not a good surgical candidate as per neuro surg.   Pt having severe low back and bilateral leg pain and has not been able to tolerate any position except laying nearly flat in the bed S/p cefazolin, for 6 weeks, completed: 12/02/22 Continue symptomatic treatment with pain medicine (Duragesic patch, Fentanyl IV prn, lidocaine prn while at HD and as needed acetaminophen).   Patient still on oral Keflex for 28 doses starting 12/05/2022   Confusion and agitation - improved Hospital delirium  Improved.   ESRD: on HD MWF.  Anemia of chronic kidney disease. Hypokalemia Continue dialysis per nephrology.   Generalized weakness / Physical Debility:  Not able to place.   Severe back and bilateral leg pain due to severe multi-level lumbar degenerative disc disease. Peripheral neuropathy. Right foot pain. Pt has bulging discs at L2-3, L3-4, L4-5 and L5-S1. Pt will continue to have back and leg pain once antibiotics are complete. Continue pain medicine as above.   COPD: w/o exacerbation.  Continue on bronchodilators    DM2: well controlled HbA1c 5.2.   Pending placement, no change in treatment plan.    Subjective: Patient has no complaint.  Good appetite, last bowel movement yesterday.  Physical Exam: Vitals:   12/31/22 1815 12/31/22 2008 01/01/23 0519 01/01/23 1000  BP: 102/63 117/76 95/62 (!) 92/56  Pulse: 95 (!) 58 91 97  Resp:  18 18 16   Temp:  98.7 F (37.1 C) 98 F (36.7 C) 98.7 F (37.1 C)  TempSrc:    Oral  SpO2: 94% 96% 94% 99%  Weight:      Height:       General exam: Appears calm and comfortable  Respiratory system: Clear to auscultation. Respiratory effort normal. Cardiovascular system: S1 & S2 heard, RRR. No JVD, murmurs, rubs, gallops or clicks. No pedal edema. Gastrointestinal system: Abdomen is nondistended, soft and nontender. No organomegaly or masses felt. Normal bowel sounds  heard. Central nervous system: Alert and oriented x1. No focal neurological deficits. Extremities: Symmetric 5 x 5 power. Skin: No rashes, lesions or ulcers Psychiatry:  Mood & affect appropriate.    Data Reviewed:  There are no new results to review at this time.  Family Communication: None  Disposition: Status is: Inpatient Remains inpatient appropriate because: Unsafe discharge option.     Time spent: 25 minutes  Author: Marrion Coy, MD 01/01/2023 12:35 PM  For on call review [email or web address].

## 2023-01-02 DIAGNOSIS — M5442 Lumbago with sciatica, left side: Secondary | ICD-10-CM | POA: Diagnosis not present

## 2023-01-02 DIAGNOSIS — E119 Type 2 diabetes mellitus without complications: Secondary | ICD-10-CM | POA: Diagnosis not present

## 2023-01-02 DIAGNOSIS — E440 Moderate protein-calorie malnutrition: Secondary | ICD-10-CM | POA: Diagnosis not present

## 2023-01-02 DIAGNOSIS — N186 End stage renal disease: Secondary | ICD-10-CM | POA: Diagnosis not present

## 2023-01-02 LAB — CBC WITH DIFFERENTIAL/PLATELET
Abs Immature Granulocytes: 0.02 10*3/uL (ref 0.00–0.07)
Basophils Absolute: 0.1 10*3/uL (ref 0.0–0.1)
Basophils Relative: 1 %
Eosinophils Absolute: 0.1 10*3/uL (ref 0.0–0.5)
Eosinophils Relative: 2 %
HCT: 34 % — ABNORMAL LOW (ref 36.0–46.0)
Hemoglobin: 10.1 g/dL — ABNORMAL LOW (ref 12.0–15.0)
Immature Granulocytes: 0 %
Lymphocytes Relative: 37 %
Lymphs Abs: 2.4 10*3/uL (ref 0.7–4.0)
MCH: 26 pg (ref 26.0–34.0)
MCHC: 29.7 g/dL — ABNORMAL LOW (ref 30.0–36.0)
MCV: 87.6 fL (ref 80.0–100.0)
Monocytes Absolute: 0.7 10*3/uL (ref 0.1–1.0)
Monocytes Relative: 11 %
Neutro Abs: 3.1 10*3/uL (ref 1.7–7.7)
Neutrophils Relative %: 49 %
Platelets: 219 10*3/uL (ref 150–400)
RBC: 3.88 MIL/uL (ref 3.87–5.11)
RDW: 19.3 % — ABNORMAL HIGH (ref 11.5–15.5)
WBC: 6.4 10*3/uL (ref 4.0–10.5)
nRBC: 0 % (ref 0.0–0.2)

## 2023-01-02 LAB — RENAL FUNCTION PANEL
Albumin: 2.8 g/dL — ABNORMAL LOW (ref 3.5–5.0)
Anion gap: 12 (ref 5–15)
BUN: 40 mg/dL — ABNORMAL HIGH (ref 8–23)
CO2: 27 mmol/L (ref 22–32)
Calcium: 8.5 mg/dL — ABNORMAL LOW (ref 8.9–10.3)
Chloride: 99 mmol/L (ref 98–111)
Creatinine, Ser: 7.49 mg/dL — ABNORMAL HIGH (ref 0.44–1.00)
GFR, Estimated: 5 mL/min — ABNORMAL LOW (ref >60)
Glucose, Bld: 116 mg/dL — ABNORMAL HIGH (ref 70–99)
Phosphorus: 2.1 mg/dL — ABNORMAL LOW (ref 2.5–4.6)
Potassium: 3.6 mmol/L (ref 3.5–5.1)
Sodium: 138 mmol/L (ref 135–145)

## 2023-01-02 MED ORDER — HEPARIN SODIUM (PORCINE) 1000 UNIT/ML DIALYSIS
25.0000 [IU]/kg | INTRAMUSCULAR | Status: DC | PRN
  Administered 2023-01-03: 2100 [IU] via INTRAVENOUS_CENTRAL
  Filled 2023-01-02: qty 3

## 2023-01-02 MED ORDER — STERILE WATER FOR INJECTION IJ SOLN
INTRAMUSCULAR | Status: AC
  Filled 2023-01-02: qty 10

## 2023-01-02 MED ORDER — ALTEPLASE 2 MG IJ SOLR
2.0000 mg | Freq: Once | INTRAMUSCULAR | Status: AC | PRN
  Administered 2023-01-02: 2 mg

## 2023-01-02 MED ORDER — HEPARIN SODIUM (PORCINE) 1000 UNIT/ML DIALYSIS
1000.0000 [IU] | INTRAMUSCULAR | Status: DC | PRN
  Administered 2023-01-02: 1000 [IU]

## 2023-01-02 MED ORDER — ALTEPLASE 2 MG IJ SOLR
INTRAMUSCULAR | Status: AC
  Filled 2023-01-02: qty 4

## 2023-01-02 MED ORDER — HEPARIN SODIUM (PORCINE) 1000 UNIT/ML DIALYSIS: 25.0000 [IU]/kg | INTRAMUSCULAR | Status: DC | PRN

## 2023-01-02 MED ORDER — HEPARIN SODIUM (PORCINE) 1000 UNIT/ML DIALYSIS: 1000.0000 [IU] | INTRAMUSCULAR | Status: DC | PRN

## 2023-01-02 NOTE — Progress Notes (Signed)
Hemodialysis Note  Received patient in bed to unit. Alert and oriented. Informed consent signed and in chart.   Treatment initiated:0835 Treatment completed:1015  Patient tolerated treatment well. Transported back to the room alert, without acute distress. Report given to patient's RN.  Access used:  [facility]  Catheter  Access issues: Arterial and Venous lines with elevated pressures during treatment, unable to sustain prescribed blood flow rate. Multiple interventions taken to without success, including reversing lines, heparinization, and repositioning of patient. After discussing with Nephrology NP, the decision was reached to Cath Flo lines and return on 01/03/23 for full treatment.   Total UF removed:0 Medications given:non, Epogen to be given next scheduled treatment.  Post HD VW:[id number]. Post HD weight:unchanged  Bartolo Darter, RN [facility]

## 2023-01-02 NOTE — TOC Progression Note (Signed)
Transition of Care [facility]) - Progression Note    Patient Details  Name: Cynthia Dean MRN: [id number] Date of Birth: 1951/02/09  Transition of Care [facility]) CM/SW Contact  [name], [place] Phone Number: 01/02/2023, 6:12 PM  Clinical Narrative:     APS interviewed patient on Friday, waiting for an update from them regarding patient needing a decision maker.       Expected Discharge Plan and Services                                               Social Determinants of Health (SDOH) Interventions SDOH Screenings   Food Insecurity: No Food Insecurity (10/12/2022)  Housing: Low Risk  (10/12/2022)  Transportation Needs: No Transportation Needs (10/12/2022)  Utilities: Not At Risk (10/12/2022)  Financial Resource Strain: Low Risk  (11/12/2017)  Physical Activity: Insufficiently Active (11/12/2017)  Social Connections: Moderately Integrated (11/12/2017)  Stress: No Stress Concern Present (11/12/2017)  Tobacco Use: Medium Risk (11/16/2022)    Readmission Risk Interventions     No data to display

## 2023-01-02 NOTE — Progress Notes (Signed)
Progress Note   [name] ZOX:[id number] DOB: 05/01/1951 DOA: 10/12/2022     82 DOS: the patient was seen and examined on 01/02/2023   Brief hospital course: [name] is a 72 y.o. 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 She presents to the ED with worsening lower extremity pain. Admitted for possible Discitis/osteomyelitis at L4-L5: w/ suspected intradiscal abscess as per MRI. 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   4/18, HD access / fistula clotted off during dialysis.  4/19 vascular placed R femoral temp HD cath placed, resumed HD and pemcath placed 4/24.  5/14- back pain seems to be well-controlled.  Caseworkers have discussed the case, involved ethics committee/Hospital administration. Looking into home dialysis.  Lacks capacity to make informed decision.   Assessment and Plan:  Right knee pain due to osteoarthritis. Plain right knee pain, x-ray showed mild effusion with osteoarthritis. 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   Wound cx grew staph capitis.   ID consulted. Not a good surgical candidate as per neuro surg.   Pt having severe low back and bilateral leg pain and has not been able to tolerate any position except laying nearly flat in the bed S/p cefazolin, for 6 weeks, completed: 12/02/22 Continue symptomatic treatment with pain medicine (Duragesic patch, Fentanyl IV prn, lidocaine prn while at HD and as needed acetaminophen).   Patient still on oral Keflex for 28 doses starting 12/05/2022   Confusion and agitation - improved Hospital delirium   Improved.   ESRD: on HD MWF.  Anemia of chronic kidney disease. Hypokalemia Continue dialysis per nephrology.  Dialysis had to be terminated early today due to multiple alarms?-Nephro team looking into HD access   Generalized weakness / Physical Debility:  Not able to place.   Severe back and bilateral leg pain due to severe multi-level lumbar degenerative disc disease. Peripheral neuropathy. Right foot pain. Pt has bulging discs at L2-3, L3-4, L4-5 and L5-S1. Pt will continue to have back and leg pain once antibiotics are complete. Continue pain medicine as above.   COPD: w/o exacerbation.  Continue on bronchodilators    DM2: well controlled HbA1c 5.2.    Pending placement, no change in treatment plan.      Subjective: Seen at dialysis.  No new issues  Physical Exam: Vitals:   01/02/23 0945 01/02/23 1000 01/02/23 1015 01/02/23 1030  BP:  114/68    Pulse: 87 92 89 89  Resp: (!) 23 12 18 20   Temp:      TempSrc:      SpO2: 92% 99% 94% 96%  Weight:      Height:       General exam: Appears calm and comfortable  Respiratory system: Clear to auscultation. Respiratory effort normal. Cardiovascular system: S1 & S2 heard, RRR. No JVD, murmurs, rubs, gallops or clicks. No pedal edema. Gastrointestinal system: Abdomen is nondistended, soft and nontender. No organomegaly or masses felt. Normal bowel sounds heard. Central nervous system: Alert and oriented x1. No focal neurological deficits. Extremities: Symmetric 5 x 5 power. Skin: No rashes, lesions or ulcers Psychiatry:  Mood & affect appropriate.  Data Reviewed:  There are  no new results to review at this time.  Family Communication: None at bedside  Disposition: Status is: Inpatient Remains inpatient appropriate because: Medically stable.  Unsafe discharge  Planned Discharge Destination: Skilled nursing facility   DVT prophylaxis-heparin Time spent: 15 minutes  Author: Delfino Lovett, MD 01/02/2023 11:55  AM  For on call review [email or web address].

## 2023-01-02 NOTE — Progress Notes (Signed)
[facility]  PROGRESS NOTE   Subjective:   Patient seen and evaluated during dialysis   HEMODIALYSIS FLOWSHEET:  Blood Flow Rate (mL/min): 300 mL/min Arterial Pressure (mmHg): -260 mmHg Venous Pressure (mmHg): 220 mmHg TMP (mmHg): 24 mmHg Ultrafiltration Rate (mL/min): 486 mL/min Dialysate Flow Rate (mL/min): 300 ml/min Dialysis Fluid Bolus: Normal Saline Bolus Amount (mL): 300 mL  Tearful due to leg pain   Objective:  Vital signs: Blood pressure 116/62, pulse 92, temperature 98.6 F (37 C), temperature source Oral, resp. rate 19, height 6' (1.829 m), weight 86.9 kg, SpO2 97 %. No intake or output data in the 24 hours ending 01/02/23 1013  Filed Weights   12/30/22 1343 12/30/22 1735 01/02/23 0832  Weight: 83.3 kg 83 kg 86.9 kg     Physical Exam: General:  No acute distress, laying in bed  Head:  Normocephalic, atraumatic. Moist oral mucosal membranes  Eyes:  Anicteric  Lungs:   Clear to auscultation, normal effort  Heart:  Irregular  Abdomen:   Soft, nontender, bowel sounds present  Extremities:  No peripheral edema.  Neurologic:  Alert and oriented to self and place  Skin:  No lesions  Access:  [facility] permcath    Basic Metabolic Panel: Recent Labs  Lab 12/26/22 1300 12/28/22 0931 12/29/22 0627 12/30/22 1409 01/02/23 0840  NA 136 138 136 138 138  K 4.3 4.1 3.6 3.8 3.6  CL 97* 100 98 98 99  CO2 26 27 27 26 27   GLUCOSE 125* 111* 98 132* 116*  BUN 55* 40* 20 31* 40*  CREATININE 8.13* 6.75* 4.44* 6.74* 7.49*  CALCIUM 8.5* 8.2* 8.2* 8.3* 8.5*  PHOS 3.6 2.5  --  2.5 2.1*    GFR: Estimated Creatinine Clearance: 8 mL/min (A) (by C-G formula based on SCr of 7.49 mg/dL (H)).  Liver Function Tests: Recent Labs  Lab 12/26/22 1300 12/28/22 0931 12/30/22 1409 01/02/23 0840  ALBUMIN 2.6* 2.7* 2.9* 2.8*    No results for input(s): "LIPASE", "AMYLASE" in the last 168 hours. No results for input(s): "AMMONIA" in the last 168  hours.  CBC: Recent Labs  Lab 12/26/22 1300 12/28/22 0931 12/29/22 0627 12/30/22 1409 01/02/23 0840  WBC 5.4 7.1 6.2 6.1 6.4  NEUTROABS  --   --   --   --  3.1  HGB 9.6* 9.8* 10.2* 10.3* 10.1*  HCT 32.3* 32.3* 33.7* 34.2* 34.0*  MCV 87.3 86.6 85.3 87.2 87.6  PLT 219 217 182 238 219      HbA1C: Hemoglobin A1C  Date/Time Value Ref Range Status  06/15/2014 12:58 AM 7.9 (H) 4.2 - 6.3 % Final    Comment:    The American Diabetes Association recommends that a primary goal of therapy should be <7% and that physicians should reevaluate the treatment regimen in patients with HbA1c values consistently >8%.   12/24/2013 11:18 AM 8.2 (H) 4.2 - 6.3 % Final    Comment:    The American Diabetes Association recommends that a primary goal of therapy should be <7% and that physicians should reevaluate the treatment regimen in patients with HbA1c values consistently >8%.    Hgb A1c MFr Bld  Date/Time Value Ref Range Status  10/13/2022 11:46 AM 5.2 4.8 - 5.6 % Final    Comment:    (NOTE)         Prediabetes: 5.7 - 6.4         Diabetes: >6.4         Glycemic control for adults with  diabetes: <7.0   03/23/2022 06:47 AM 5.8 (H) 4.8 - 5.6 % Final    Comment:    (NOTE) Pre diabetes:          5.7%-6.4%  Diabetes:              >6.4%  Glycemic control for   <7.0% adults with diabetes     Urinalysis: No results for input(s): "COLORURINE", "LABSPEC", "PHURINE", "GLUCOSEU", "HGBUR", "BILIRUBINUR", "KETONESUR", "PROTEINUR", "UROBILINOGEN", "NITRITE", "LEUKOCYTESUR" in the last 72 hours.  Invalid input(s): "APPERANCEUR"    Imaging: No results found.   Medications:    sodium chloride Stopped (11/16/22 1155)   anticoagulant sodium citrate      (feeding supplement) PROSource Plus  30 mL Oral TID BM   Chlorhexidine Gluconate Cloth  6 each Topical Q0600   cinacalcet  60 mg Oral Q supper   epoetin (EPOGEN/PROCRIT) injection  10,000 Units Intravenous Q M,W,F-HD   feeding  supplement (NEPRO CARB STEADY)  237 mL Oral TID BM   fentaNYL  1 patch Transdermal Q72H   gabapentin  100 mg Oral Once per day on Mon Wed Fri   heparin injection (subcutaneous)  5,000 Units Subcutaneous Q8H   levETIRAcetam  1,000 mg Oral Daily   levETIRAcetam  250 mg Oral Q M,W,F   lidocaine  1 patch Transdermal Q24H   multivitamin  1 tablet Oral QHS   pantoprazole  40 mg Oral BID   psyllium  1 packet Oral Daily   QUEtiapine  25 mg Oral QHS   sertraline  25 mg Oral Daily   sevelamer carbonate  2,400 mg Oral TID WC   sodium chloride flush  10-40 mL Intracatheter Q12H   umeclidinium-vilanterol  1 puff Inhalation Daily    Assessment/ Plan:     [name] is a 72 y.o. female with end stage renal disease on hemodialysis, CAD, COPD with intermittent home O2, hypertension, hyperlipidemia, stroke, diabetes who has been admitted since 3/20. Initially admitted for vertebral osteomyelitis. She has completed IV antibiotics. Now is working on placement.   Due to extended hospitalization, patient will need outpatient dialysis clinic re-initiated.   [facility] TTS [facility] permcath   #1 ESRD:  - Dialysis scheduled for today. Multiple alarms resulting in treatment termination. Will cathflo HD access and re-attempt treatment tomorrow.    #2: Anemia with chronic kidney disease: hemoglobin 10.1 - Will receive low dose EPO with treatment tomorrow.    #3: Second hyperparathyroidism: With hypercalcemia Lab Results  Component Value Date   PTH 157 (H) 12/08/2022   CALCIUM 8.5 (L) 01/02/2023   CAION 1.09 (L) 08/28/2022   PHOS 2.1 (L) 01/02/2023   - Zolmeta 4mg  IV on 12/22/2022 - Calcium has improved -Continue Cinacalcet to 60 mg daily. - Continue sevelamer with meals.    #4: Hypertension Not currently on any blood pressure agents. Blood pressure stable for this patient    LOS: [address] [facility] 6/10/202410:13 AM

## 2023-01-03 DIAGNOSIS — N186 End stage renal disease: Secondary | ICD-10-CM | POA: Diagnosis not present

## 2023-01-03 DIAGNOSIS — I10 Essential (primary) hypertension: Secondary | ICD-10-CM | POA: Diagnosis not present

## 2023-01-03 DIAGNOSIS — Z992 Dependence on renal dialysis: Secondary | ICD-10-CM | POA: Diagnosis not present

## 2023-01-03 LAB — CBC
HCT: 34.2 % — ABNORMAL LOW (ref 36.0–46.0)
Hemoglobin: 10.1 g/dL — ABNORMAL LOW (ref 12.0–15.0)
MCH: 25.7 pg — ABNORMAL LOW (ref 26.0–34.0)
MCHC: 29.5 g/dL — ABNORMAL LOW (ref 30.0–36.0)
MCV: 87 fL (ref 80.0–100.0)
Platelets: 219 10*3/uL (ref 150–400)
RBC: 3.93 MIL/uL (ref 3.87–5.11)
RDW: 19 % — ABNORMAL HIGH (ref 11.5–15.5)
WBC: 6.1 10*3/uL (ref 4.0–10.5)
nRBC: 0 % (ref 0.0–0.2)

## 2023-01-03 LAB — RENAL FUNCTION PANEL
Albumin: 2.8 g/dL — ABNORMAL LOW (ref 3.5–5.0)
Anion gap: 13 (ref 5–15)
BUN: 48 mg/dL — ABNORMAL HIGH (ref 8–23)
CO2: 25 mmol/L (ref 22–32)
Calcium: 8.6 mg/dL — ABNORMAL LOW (ref 8.9–10.3)
Chloride: 100 mmol/L (ref 98–111)
Creatinine, Ser: 7.89 mg/dL — ABNORMAL HIGH (ref 0.44–1.00)
GFR, Estimated: 5 mL/min — ABNORMAL LOW (ref >60)
Glucose, Bld: 100 mg/dL — ABNORMAL HIGH (ref 70–99)
Phosphorus: 2.6 mg/dL (ref 2.5–4.6)
Potassium: 3.8 mmol/L (ref 3.5–5.1)
Sodium: 138 mmol/L (ref 135–145)

## 2023-01-03 MED ORDER — EPOETIN ALFA 4000 UNIT/ML IJ SOLN
4000.0000 [IU] | INTRAMUSCULAR | Status: DC
  Administered 2023-01-04 – 2023-01-12 (×4): 4000 [IU] via INTRAVENOUS
  Filled 2023-01-03 (×4): qty 1

## 2023-01-03 MED ORDER — HEPARIN SODIUM (PORCINE) 1000 UNIT/ML IJ SOLN
INTRAMUSCULAR | Status: AC
  Filled 2023-01-03: qty 10

## 2023-01-03 MED ORDER — EPOETIN ALFA 10000 UNIT/ML IJ SOLN
INTRAMUSCULAR | Status: AC
  Filled 2023-01-03: qty 1

## 2023-01-03 MED ORDER — FENTANYL CITRATE PF 50 MCG/ML IJ SOSY
PREFILLED_SYRINGE | INTRAMUSCULAR | Status: AC
  Filled 2023-01-03: qty 1

## 2023-01-03 NOTE — Progress Notes (Signed)
Progress Note   [name] WUJ:[id number] DOB: 16-Dec-1950 DOA: 10/12/2022     83 DOS: the patient was seen and examined on 01/03/2023   Brief hospital course: Averyanna Lamiah Marmol is a 72 y.o. 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 She presents to the ED with worsening lower extremity pain. Admitted for possible Discitis/osteomyelitis at L4-L5: w/ suspected intradiscal abscess as per MRI. 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   4/18, HD access / fistula clotted off during dialysis.  4/19 vascular placed R femoral temp HD cath placed, resumed HD and pemcath placed 4/24.  5/14- back pain seems to be well-controlled.  Caseworkers have discussed the case, involved ethics committee/Hospital administration. Looking into home dialysis.  Lacks capacity to make informed decision.    Principal Problem:   Back pain Active Problems:   Anemia   Bilateral leg pain   ESRD on dialysis [facility])   Essential hypertension   Dyslipidemia   Seizure disorder (HCC)   Diabetic retinopathy without macular edema associated with type 2 diabetes mellitus (HCC)   GERD (gastroesophageal reflux disease)   Diabetic neuropathy (HCC)   Anemia of chronic disease   Insulin-requiring or dependent type II diabetes mellitus (HCC)   Discitis of lumbar region   Myositis   Psoas abscess (HCC)   Malnutrition of moderate degree   Knee osteomyelits, right (HCC)   Assessment and Plan:   Right knee pain due to osteoarthritis. Plain right knee pain, x-ray showed mild effusion with osteoarthritis. Continue pain meds as ordered   Lumbar discitis, L4-L5 epidural phlegmon and b/l psoas abscess on MRI S/p IR aspiration of psoas abscess on 10/13/22 Low back pain, Bilateral Leg Pain - due to above Vanco and cefepime on  10/13/22 after procedure.   Wound cx grew staph capitis.   ID consulted. Not a good surgical candidate as per neuro surg.   Pt having severe low back and bilateral leg pain and has not been able to tolerate any position except laying nearly flat in the bed S/p cefazolin, for 6 weeks, completed: 12/02/22 Continue symptomatic treatment with pain medicine (Duragesic patch, Fentanyl IV prn, lidocaine prn while at HD and as needed acetaminophen).   Patient still on oral Keflex for 28 doses starting 12/05/2022   Confusion and agitation - improved Hospital delirium  Improved.   ESRD: on HD MWF.  Anemia of chronic kidney disease. Hypokalemia Continue dialysis per nephrology.  Dialysis had to be terminated early today due to multiple alarms?-Nephro team looking into HD access   Generalized weakness / Physical Debility:  Not able to place.   Severe back and bilateral leg pain due to severe multi-level lumbar degenerative disc disease. Peripheral neuropathy. Right foot pain. Pt has bulging discs at L2-3, L3-4, L4-5 and L5-S1. Pt will continue to have back and leg pain once antibiotics are complete. Continue pain medicine as above.   COPD: w/o exacerbation.  Continue on bronchodilators    DM2: well controlled HbA1c 5.2.   Currently patient has no discharge options, will continue to keep patient, no change in treatment plan.    Subjective: Patient has no complaint.  Physical Exam: Vitals:   01/03/23 1130 01/03/23 1200 01/03/23 1230 01/03/23 1245  BP: 94/72 107/70 109/65   Pulse: 95 86 88   Resp: 11 18 15    Temp:   98 F (36.7 C)  TempSrc:   Oral   SpO2: 100% 99% 98%   Weight:    86.1 kg  Height:       General exam: Appears calm and comfortable  Respiratory system: Clear to auscultation. Respiratory effort normal. Cardiovascular system: S1 & S2 heard, RRR. No JVD, murmurs, rubs, gallops or clicks. No pedal edema. Gastrointestinal system: Abdomen is nondistended, soft and  nontender. No organomegaly or masses felt. Normal bowel sounds heard. Central nervous system: Alert and oriented x1. No focal neurological deficits. Extremities: Symmetric 5 x 5 power. Skin: No rashes, lesions or ulcers Psychiatry:  Mood & affect appropriate.    Data Reviewed:  There are no new results to review at this time.  Family Communication: None  Disposition: Status is: Inpatient Remains inpatient appropriate because: Unsafe discharge plan.     Time spent: 25 minutes  Author: Marrion Coy, MD 01/03/2023 2:08 PM  For on call review [email or web address].

## 2023-01-03 NOTE — Progress Notes (Signed)
[facility]  PROGRESS NOTE   Subjective:   Patient seen and evaluated during dialysis   HEMODIALYSIS FLOWSHEET:  Blood Flow Rate (mL/min): 330 mL/min Arterial Pressure (mmHg): -170 mmHg Venous Pressure (mmHg): 230 mmHg TMP (mmHg): 9 mmHg Ultrafiltration Rate (mL/min): 400 mL/min Dialysate Flow Rate (mL/min): 300 ml/min Dialysis Fluid Bolus: Normal Saline Bolus Amount (mL): 300 mL  Treatment initiated without concern Patient tolerating well   Objective:  Vital signs: Blood pressure 115/67, pulse 92, temperature 98.1 F (36.7 C), temperature source Oral, resp. rate 17, height 6' (1.829 m), weight 86.6 kg, SpO2 98 %.  Intake/Output Summary (Last 24 hours) at 01/03/2023 1047 Last data filed at 01/02/2023 1940 Gross per 24 hour  Intake 240 ml  Output --  Net 240 ml    Filed Weights   12/30/22 1735 01/02/23 0832 01/03/23 0833  Weight: 83 kg 86.9 kg 86.6 kg     Physical Exam: General:  No acute distress, laying in bed  Head:  Normocephalic, atraumatic. Moist oral mucosal membranes  Eyes:  Anicteric  Lungs:   Clear to auscultation, normal effort  Heart:  Irregular  Abdomen:   Soft, nontender, bowel sounds present  Extremities:  No peripheral edema.  Neurologic:  Alert and oriented to self and place  Skin:  No lesions  Access:  LIJ permcath    Basic Metabolic Panel: Recent Labs  Lab 12/28/22 0931 12/29/22 0627 12/30/22 1409 01/02/23 0840 01/03/23 0900  NA 138 136 138 138 138  K 4.1 3.6 3.8 3.6 3.8  CL 100 98 98 99 100  CO2 27 27 26 27 25   GLUCOSE 111* 98 132* 116* 100*  BUN 40* 20 31* 40* 48*  CREATININE 6.75* 4.44* 6.74* 7.49* 7.89*  CALCIUM 8.2* 8.2* 8.3* 8.5* 8.6*  PHOS 2.5  --  2.5 2.1* 2.6    GFR: Estimated Creatinine Clearance: 7.5 mL/min (A) (by C-G formula based on SCr of 7.89 mg/dL (H)).  Liver Function Tests: Recent Labs  Lab 12/28/22 0931 12/30/22 1409 01/02/23 0840 01/03/23 0900  ALBUMIN 2.7* 2.9* 2.8* 2.8*    No  results for input(s): "LIPASE", "AMYLASE" in the last 168 hours. No results for input(s): "AMMONIA" in the last 168 hours.  CBC: Recent Labs  Lab 12/28/22 0931 12/29/22 0627 12/30/22 1409 01/02/23 0840 01/03/23 0900  WBC 7.1 6.2 6.1 6.4 6.1  NEUTROABS  --   --   --  3.1  --   HGB 9.8* 10.2* 10.3* 10.1* 10.1*  HCT 32.3* 33.7* 34.2* 34.0* 34.2*  MCV 86.6 85.3 87.2 87.6 87.0  PLT 217 182 238 219 219      HbA1C: Hemoglobin A1C  Date/Time Value Ref Range Status  06/15/2014 12:58 AM 7.9 (H) 4.2 - 6.3 % Final    Comment:    The American Diabetes Association recommends that a primary goal of therapy should be <7% and that physicians should reevaluate the treatment regimen in patients with HbA1c values consistently >8%.   12/24/2013 11:18 AM 8.2 (H) 4.2 - 6.3 % Final    Comment:    The American Diabetes Association recommends that a primary goal of therapy should be <7% and that physicians should reevaluate the treatment regimen in patients with HbA1c values consistently >8%.    Hgb A1c MFr Bld  Date/Time Value Ref Range Status  10/13/2022 11:46 AM 5.2 4.8 - 5.6 % Final    Comment:    (NOTE)         Prediabetes: 5.7 - 6.4  Diabetes: >6.4         Glycemic control for adults with diabetes: <7.0   03/23/2022 06:47 AM 5.8 (H) 4.8 - 5.6 % Final    Comment:    (NOTE) Pre diabetes:          5.7%-6.4%  Diabetes:              >6.4%  Glycemic control for   <7.0% adults with diabetes     Urinalysis: No results for input(s): "COLORURINE", "LABSPEC", "PHURINE", "GLUCOSEU", "HGBUR", "BILIRUBINUR", "KETONESUR", "PROTEINUR", "UROBILINOGEN", "NITRITE", "LEUKOCYTESUR" in the last 72 hours.  Invalid input(s): "APPERANCEUR"    Imaging: No results found.   Medications:    sodium chloride Stopped (11/16/22 1155)   anticoagulant sodium citrate      (feeding supplement) PROSource Plus  30 mL Oral TID BM   Chlorhexidine Gluconate Cloth  6 each Topical Q0600    cinacalcet  60 mg Oral Q supper   epoetin (EPOGEN/PROCRIT) injection  10,000 Units Intravenous Q M,W,F-HD   feeding supplement (NEPRO CARB STEADY)  237 mL Oral TID BM   fentaNYL  1 patch Transdermal Q72H   gabapentin  100 mg Oral Once per day on Mon Wed Fri   heparin injection (subcutaneous)  5,000 Units Subcutaneous Q8H   levETIRAcetam  1,000 mg Oral Daily   levETIRAcetam  250 mg Oral Q M,W,F   lidocaine  1 patch Transdermal Q24H   multivitamin  1 tablet Oral QHS   pantoprazole  40 mg Oral BID   psyllium  1 packet Oral Daily   QUEtiapine  25 mg Oral QHS   sertraline  25 mg Oral Daily   sevelamer carbonate  2,400 mg Oral TID WC   sodium chloride flush  10-40 mL Intracatheter Q12H   umeclidinium-vilanterol  1 puff Inhalation Daily    Assessment/ Plan:     [name] is a 72 y.o. female with end stage renal disease on hemodialysis, CAD, COPD with intermittent home O2, hypertension, hyperlipidemia, stroke, diabetes who has been admitted since 3/20. Initially admitted for vertebral osteomyelitis. She has completed IV antibiotics. Now is working on placement.   Due to extended hospitalization, patient will need outpatient dialysis clinic re-initiated.   [facility] TTS [facility] permcath   #1 ESRD:  - Dialysis treatment terminated yesterday due to HD access issues.  - Cathflo placed overnight - Treatment proceeding without incident  - Next treatment scheduled for Wednesday   #2: Anemia with chronic kidney disease: hemoglobin remains 10.1 - Continue low dose EPO with treatment    #3: Second hyperparathyroidism: With hypercalcemia Lab Results  Component Value Date   PTH 157 (H) 12/08/2022   CALCIUM 8.6 (L) 01/03/2023   CAION 1.09 (L) 08/28/2022   PHOS 2.6 01/03/2023   - Zolmeta 4mg  IV on 12/22/2022 - Bone minerals within desired range -Continue Cinacalcet to 60 mg daily. - Continue sevelamer with meals.    #4: Hypertension  Not currently on any  blood pressure agents.     LOS: [address] [facility] 6/11/202410:47 AM

## 2023-01-03 NOTE — Progress Notes (Signed)
  Received patient in bed to unit.   Informed consent signed and in chart.    TX duration:  3.5hrs     Transported back to floor  Hand-off given to patient's nurse. No c/o, no distress noted   Access used: L HD Catheter Access issues: none   Total UF removed: 0.5L Medication(s) given: 12.33mcg fentanyl, 10,000u epo Post HD VS: 109/65 Post HD weight: 86.1kg     Lynann Beaver  [facility]

## 2023-01-03 NOTE — Progress Notes (Signed)
Nutrition Follow-up  DOCUMENTATION CODES:   Non-severe (moderate) malnutrition in context of chronic illness  INTERVENTION:   -Continue renal MVI daily -Continue 30 ml Prosource Plus to TID, each supplement provides 100 kcals and 15 grams protein -Continue Nepro Shake po TID, each supplement provides 425 kcal and 19 grams protein  -Continue liberalized diet of regular  -Continue feeding assistance with meals  NUTRITION DIAGNOSIS:   Moderate Malnutrition related to chronic illness (ESRD on HD) as evidenced by percent weight loss, mild muscle depletion, moderate muscle depletion, edema.  Ongoing  GOAL:   Patient will meet greater than or equal to 90% of their needs  Progressing   MONITOR:   PO intake, Supplement acceptance  REASON FOR ASSESSMENT:   Malnutrition Screening Tool    ASSESSMENT:   Pt with PMH of ESRD on HD, HLD, COPD, DM2, HTN, depression, CVA who presents with back pain x 3 weeks PTA.  Reviewed I/O's: +240 ml x 24 hours and +647 ml since 12/20/22   Per psych eval pt unable to make an informed decision regarding decision to stop HD treatment or refuse HD treatment. TOC attempting to contact HCPOA for further goals of care discussions and plans of possibility of home HD. Per TOC notes, APS has assessed pt and waiting for update regarding decision maker.   Pt out of room at time of visit. Pt reports pt has been more alert and interactive the past few days. Oral intake has improved; noted meal completions 50-100%. Pt has been more compliant with supplements this week.   Wt has been stable over the past month.   Medications reviewed and include sensipar, epogen, keppra, rena-vit, metamucil, and renvela.   Labs reviewed: Na: 131.    Diet Order:   Diet Order             Diet regular Room service appropriate? Yes; Fluid consistency: Thin  Diet effective now                   EDUCATION NEEDS:   No education needs have been identified at this  time  Skin:  Skin Assessment: Skin Integrity Issues: Skin Integrity Issues:: Other (Comment) Other: IAD to lt buttocks  Last BM:  01/02/23 (type 6)  Height:   Ht Readings from Last 1 Encounters:  10/12/22 6' (1.829 m)    Weight:   Wt Readings from Last 1 Encounters:  01/03/23 86.6 kg    Ideal Body Weight:  72.7 kg  BMI:  Body mass index is 25.89 kg/m.  Estimated Nutritional Needs:   Kcal:  2150-2350  Protein:  105-120 grams  Fluid:  1000 ml + UOP    Levada Schilling, RD, LDN, CDCES Registered Dietitian II Certified Diabetes Care and Education Specialist Please refer to [facility] for RD and/or RD on-call/weekend/after hours pager

## 2023-01-04 DIAGNOSIS — N186 End stage renal disease: Secondary | ICD-10-CM | POA: Diagnosis not present

## 2023-01-04 DIAGNOSIS — Z992 Dependence on renal dialysis: Secondary | ICD-10-CM | POA: Diagnosis not present

## 2023-01-04 DIAGNOSIS — E876 Hypokalemia: Secondary | ICD-10-CM

## 2023-01-04 LAB — CBC
HCT: 34.2 % — ABNORMAL LOW (ref 36.0–46.0)
Hemoglobin: 10.3 g/dL — ABNORMAL LOW (ref 12.0–15.0)
MCH: 26.1 pg (ref 26.0–34.0)
MCHC: 30.1 g/dL (ref 30.0–36.0)
MCV: 86.6 fL (ref 80.0–100.0)
Platelets: 210 10*3/uL (ref 150–400)
RBC: 3.95 MIL/uL (ref 3.87–5.11)
RDW: 18.8 % — ABNORMAL HIGH (ref 11.5–15.5)
WBC: 6.5 10*3/uL (ref 4.0–10.5)
nRBC: 0 % (ref 0.0–0.2)

## 2023-01-04 LAB — RENAL FUNCTION PANEL
Albumin: 2.8 g/dL — ABNORMAL LOW (ref 3.5–5.0)
Anion gap: 10 (ref 5–15)
BUN: 28 mg/dL — ABNORMAL HIGH (ref 8–23)
CO2: 29 mmol/L (ref 22–32)
Calcium: 7.4 mg/dL — ABNORMAL LOW (ref 8.9–10.3)
Chloride: 97 mmol/L — ABNORMAL LOW (ref 98–111)
Creatinine, Ser: 4.93 mg/dL — ABNORMAL HIGH (ref 0.44–1.00)
GFR, Estimated: 9 mL/min — ABNORMAL LOW (ref >60)
Glucose, Bld: 87 mg/dL (ref 70–99)
Phosphorus: 1.9 mg/dL — ABNORMAL LOW (ref 2.5–4.6)
Potassium: 3.4 mmol/L — ABNORMAL LOW (ref 3.5–5.1)
Sodium: 136 mmol/L (ref 135–145)

## 2023-01-04 MED ORDER — POLYETHYLENE GLYCOL 3350 17 G PO PACK
17.0000 g | PACK | Freq: Every day | ORAL | Status: DC
  Administered 2023-01-04 – 2023-01-05 (×2): 17 g via ORAL
  Filled 2023-01-04 (×2): qty 1

## 2023-01-04 MED ORDER — EPOETIN ALFA 4000 UNIT/ML IJ SOLN
INTRAMUSCULAR | Status: AC
  Filled 2023-01-04: qty 1

## 2023-01-04 MED ORDER — CALCIUM CARBONATE ANTACID 500 MG PO CHEW: 400.0000 mg | CHEWABLE_TABLET | Freq: Two times a day (BID) | ORAL | Status: DC | PRN

## 2023-01-04 NOTE — TOC Progression Note (Signed)
Transition of Care [facility]) - Progression Note    Patient Details  Name: Cynthia Dean MRN: [id number] Date of Birth: [birth year]  Transition of Care [facility]) CM/SW Contact  [name], [place] Phone Number: 01/04/2023, 2:30 PM  Clinical Narrative:     CSW was informed by Dimas Chyle, dialysis care manager, that patient's daughter [name] has been contacted.  They are interested in getting set up with home dialysis.  TOC to continue to follow patient's progress throughout discharge planning.         Expected Discharge Plan and Services                                               Social Determinants of Health (SDOH) Interventions SDOH Screenings   Food Insecurity: No Food Insecurity (10/12/2022)  Housing: Low Risk  (10/12/2022)  Transportation Needs: No Transportation Needs (10/12/2022)  Utilities: Not At Risk (10/12/2022)  Financial Resource Strain: Low Risk  (11/12/2017)  Physical Activity: Insufficiently Active (11/12/2017)  Social Connections: Moderately Integrated (11/12/2017)  Stress: No Stress Concern Present (11/12/2017)  Tobacco Use: Medium Risk (11/16/2022)    Readmission Risk Interventions     No data to display

## 2023-01-04 NOTE — Discharge Planning (Signed)
Received called today form [name] (HPOA). We discussed Home Hemo and she is agreeable to training and scheduling a home visit. Spoke with Thayer Ohm (HHD RN) to explain patient situation and Thayer Ohm stated she will be calling [name] today to schedule a home visit.

## 2023-01-04 NOTE — Progress Notes (Signed)
[facility]  PROGRESS NOTE   Subjective:   Patient seen and evaluated during dialysis   HEMODIALYSIS FLOWSHEET:  Blood Flow Rate (mL/min): 400 mL/min Arterial Pressure (mmHg): -250 mmHg Venous Pressure (mmHg): 180 mmHg TMP (mmHg): 0 mmHg Ultrafiltration Rate (mL/min): 400 mL/min Dialysate Flow Rate (mL/min): 300 ml/min Dialysis Fluid Bolus: Normal Saline Bolus Amount (mL): 300 mL  Remains on room air with no lower extremity edema Appetite remains poor Denies nausea or vomiting  Objective:  Vital signs: Blood pressure 96/61, pulse 92, temperature 98.4 F (36.9 C), resp. rate 16, height 6' (1.829 m), weight 84.5 kg, SpO2 100 %.  Intake/Output Summary (Last 24 hours) at 01/04/2023 1534 Last data filed at 01/04/2023 1218 Gross per 24 hour  Intake --  Output 500 ml  Net -500 ml    Filed Weights   01/03/23 1245 01/04/23 0826 01/04/23 1228  Weight: 86.1 kg 85 kg 84.5 kg     Physical Exam: General:  No acute distress, laying in bed  Head:  Normocephalic, atraumatic. Moist oral mucosal membranes  Eyes:  Anicteric  Lungs:   Clear to auscultation, normal effort  Heart:  Irregular  Abdomen:   Soft, nontender, bowel sounds present  Extremities:  No peripheral edema.  Neurologic:  Alert and oriented to self and place  Skin:  No lesions  Access:  LIJ permcath    Basic Metabolic Panel: Recent Labs  Lab 12/29/22 0627 12/30/22 1409 01/02/23 0840 01/03/23 0900 01/04/23 0840  NA 136 138 138 138 136  K 3.6 3.8 3.6 3.8 3.4*  CL 98 98 99 100 97*  CO2 27 26 27 25 29   GLUCOSE 98 132* 116* 100* 87  BUN 20 31* 40* 48* 28*  CREATININE 4.44* 6.74* 7.49* 7.89* 4.93*  CALCIUM 8.2* 8.3* 8.5* 8.6* 7.4*  PHOS  --  2.5 2.1* 2.6 1.9*    GFR: Estimated Creatinine Clearance: 12.1 mL/min (A) (by C-G formula based on SCr of 4.93 mg/dL (H)).  Liver Function Tests: Recent Labs  Lab 12/30/22 1409 01/02/23 0840 01/03/23 0900 01/04/23 0840  ALBUMIN 2.9* 2.8* 2.8*  2.8*    No results for input(s): "LIPASE", "AMYLASE" in the last 168 hours. No results for input(s): "AMMONIA" in the last 168 hours.  CBC: Recent Labs  Lab 12/29/22 0627 12/30/22 1409 01/02/23 0840 01/03/23 0900 01/04/23 0840  WBC 6.2 6.1 6.4 6.1 6.5  NEUTROABS  --   --  3.1  --   --   HGB 10.2* 10.3* 10.1* 10.1* 10.3*  HCT 33.7* 34.2* 34.0* 34.2* 34.2*  MCV 85.3 87.2 87.6 87.0 86.6  PLT 182 238 219 219 210      HbA1C: Hemoglobin A1C  Date/Time Value Ref Range Status  06/15/2014 12:58 AM 7.9 (H) 4.2 - 6.3 % Final    Comment:    The American Diabetes Association recommends that a primary goal of therapy should be <7% and that physicians should reevaluate the treatment regimen in patients with HbA1c values consistently >8%.   12/24/2013 11:18 AM 8.2 (H) 4.2 - 6.3 % Final    Comment:    The American Diabetes Association recommends that a primary goal of therapy should be <7% and that physicians should reevaluate the treatment regimen in patients with HbA1c values consistently >8%.    Hgb A1c MFr Bld  Date/Time Value Ref Range Status  10/13/2022 11:46 AM 5.2 4.8 - 5.6 % Final    Comment:    (NOTE)  Prediabetes: 5.7 - 6.4         Diabetes: >6.4         Glycemic control for adults with diabetes: <7.0   03/23/2022 06:47 AM 5.8 (H) 4.8 - 5.6 % Final    Comment:    (NOTE) Pre diabetes:          5.7%-6.4%  Diabetes:              >6.4%  Glycemic control for   <7.0% adults with diabetes     Urinalysis: No results for input(s): "COLORURINE", "LABSPEC", "PHURINE", "GLUCOSEU", "HGBUR", "BILIRUBINUR", "KETONESUR", "PROTEINUR", "UROBILINOGEN", "NITRITE", "LEUKOCYTESUR" in the last 72 hours.  Invalid input(s): "APPERANCEUR"    Imaging: No results found.   Medications:    sodium chloride Stopped (11/16/22 1155)   anticoagulant sodium citrate      (feeding supplement) PROSource Plus  30 mL Oral TID BM   Chlorhexidine Gluconate Cloth  6 each Topical  Q0600   cinacalcet  60 mg Oral Q supper   epoetin (EPOGEN/PROCRIT) injection  4,000 Units Intravenous Q M,W,F-HD   feeding supplement (NEPRO CARB STEADY)  237 mL Oral TID BM   fentaNYL  1 patch Transdermal Q72H   gabapentin  100 mg Oral Once per day on Mon Wed Fri   heparin injection (subcutaneous)  5,000 Units Subcutaneous Q8H   levETIRAcetam  1,000 mg Oral Daily   levETIRAcetam  250 mg Oral Q M,W,F   lidocaine  1 patch Transdermal Q24H   multivitamin  1 tablet Oral QHS   pantoprazole  40 mg Oral BID   polyethylene glycol  17 g Oral Daily   psyllium  1 packet Oral Daily   QUEtiapine  25 mg Oral QHS   sertraline  25 mg Oral Daily   sodium chloride flush  10-40 mL Intracatheter Q12H   umeclidinium-vilanterol  1 puff Inhalation Daily    Assessment/ Plan:     Cynthia Dean is a 72 y.o. female with end stage renal disease on hemodialysis, CAD, COPD with intermittent home O2, hypertension, hyperlipidemia, stroke, diabetes who has been admitted since 3/20. Initially admitted for vertebral osteomyelitis. She has completed IV antibiotics. Now is working on placement.   Due to extended hospitalization, patient will need outpatient dialysis clinic re-initiated.   [facility] T[facility] permcath   #1 ESRD:  -Patient received dialysis today, UF goal 0.5 to 1 L as tolerated - Next treatment scheduled for Friday. - Continue to encourage patient to sit in chair position. -Renal navigator able to contact HCPOA, currently arranging home dialysis nurse visit for evaluation.   #2: Anemia with chronic kidney disease: hemoglobin remains 10.3 -Will hold EPO.  #3: Second hyperparathyroidism: With hypercalcemia Lab Results  Component Value Date   PTH 157 (H) 12/08/2022   CALCIUM 7.4 (L) 01/04/2023   CAION 1.09 (L) 08/28/2022   PHOS 1.9 (L) 01/04/2023   - Zolmeta 4mg  IV on 12/22/2022 -Corrected calcium 8.4. -Continue Cinacalcet to 60 mg daily. -Hypophosphatemia, will  hold binders.   #4: Hypertension  Not currently on any blood pressure agents.  Blood pressure 118/77 during dialysis.    LOS: [address] [facility] 6/12/20243:34 PM

## 2023-01-04 NOTE — Progress Notes (Signed)
  Received patient in bed to unit.   Informed consent signed and in chart.    TX duration:3.5hrs     Transported back to floor  Hand-off given to patient's nurse. No c/o and no distress noted    Access used: L HD Catheter Access issues: lines reversed    Total UF removed: 0.5L Medication(s) given: 4,000u epogen Post HD VS: 125/69 Post HD weight: 84.5kg     Lynann Beaver  [facility]ialysis [facility]

## 2023-01-04 NOTE — Progress Notes (Signed)
Progress Note   [name] EAV:[id number] DOB: 1950-10-22 DOA: 10/12/2022     84 DOS: the patient was seen and examined on 01/04/2023   Brief hospital course: Cynthia Dean is a 72 y.o. 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 She presents to the ED with worsening lower extremity pain. Admitted for possible Discitis/osteomyelitis at L4-L5: w/ suspected intradiscal abscess as per MRI. 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   4/18, HD access / fistula clotted off during dialysis.  4/19 vascular placed R femoral temp HD cath placed, resumed HD and pemcath placed 4/24.  5/14- back pain seems to be well-controlled.  Caseworkers have discussed the case, involved ethics committee/Hospital administration. Looking into home dialysis.  Lacks capacity to make informed decision.    Principal Problem:   Back pain Active Problems:   Anemia   Bilateral leg pain   ESRD on dialysis [facility])   Essential hypertension   Dyslipidemia   Seizure disorder (HCC)   Diabetic retinopathy without macular edema associated with type 2 diabetes mellitus (HCC)   GERD (gastroesophageal reflux disease)   Diabetic neuropathy (HCC)   Anemia of chronic disease   Insulin-requiring or dependent type II diabetes mellitus (HCC)   Discitis of lumbar region   Myositis   Psoas abscess (HCC)   Malnutrition of moderate degree   Knee osteomyelits, right (HCC)   Hypokalemia   Hypophosphatemia   Assessment and Plan:  Right knee pain due to osteoarthritis. Plain right knee pain, x-ray showed mild effusion with osteoarthritis. Continue pain meds as ordered   Lumbar discitis, L4-L5 epidural phlegmon and b/l psoas abscess on MRI S/p IR aspiration of psoas abscess on 10/13/22 Low back pain, Bilateral Leg Pain - due  to above Vanco and cefepime on 10/13/22 after procedure.   Wound cx grew staph capitis.   ID consulted. Not a good surgical candidate as per neuro surg.   Pt having severe low back and bilateral leg pain and has not been able to tolerate any position except laying nearly flat in the bed S/p cefazolin, for 6 weeks, completed: 12/02/22 Continue symptomatic treatment with pain medicine (Duragesic patch, Fentanyl IV prn, lidocaine prn while at HD and as needed acetaminophen).   Patient still on oral Keflex for 28 doses starting 12/05/2022   Confusion and agitation - improved Hospital delirium  Improved.   ESRD: on HD MWF.  Anemia of chronic kidney disease. Hypokalemia Hypophosphatemia. Discussed with nephrology, patient will be continued on dialysis, will manage with potassium phosphate over dialysis. Patient also has intermittent abdominal discomfort, on PPI twice a day.  Also added Tums as needed.  Last recorded bowel movement was 6/10, will schedule MiraLAX.   Generalized weakness / Physical Debility:  Not able to place.   Severe back and bilateral leg pain due to severe multi-level lumbar degenerative disc disease. Peripheral neuropathy. Right foot pain. Pt has bulging discs at L2-3, L3-4, L4-5 and L5-S1. Pt will continue to have back and leg pain once antibiotics are complete. Continue pain medicine as above.   COPD: w/o exacerbation.  Continue on bronchodilators    DM2: well controlled HbA1c 5.2.       Subjective:  Patient complaining of upper stomach discomfort.  No nausea vomiting.  Last recorded bowel movement was 6/10.  Physical Exam: Vitals:   01/04/23 1000 01/04/23 1030 01/04/23 1100 01/04/23 1130  BP: 139/71 107/66 118/81 122/67  Pulse: 92 92 92 91  Resp: 18 18 18 14   Temp:      TempSrc:      SpO2: 92% 95% 99% 94%  Weight:      Height:       General exam: Appears calm and comfortable  Respiratory system: Clear to auscultation. Respiratory effort  normal. Cardiovascular system: S1 & S2 heard, RRR. No JVD, murmurs, rubs, gallops or clicks. No pedal edema. Gastrointestinal system: Abdomen is nondistended, soft and nontender. No organomegaly or masses felt. Normal bowel sounds heard. Central nervous system: Alert and oriented x2. No focal neurological deficits. Extremities: Symmetric 5 x 5 power. Skin: No rashes, lesions or ulcers Psychiatry:  Mood & affect appropriate.    Data Reviewed:  Lab results reviewed.  Family Communication: None  Disposition: Status is: Inpatient Remains inpatient appropriate because: Unsafe discharge option.     Time spent: 35 minutes  Author: Marrion Coy, MD 01/04/2023 11:45 AM  For on call review [email or web address].

## 2023-01-05 DIAGNOSIS — Z992 Dependence on renal dialysis: Secondary | ICD-10-CM | POA: Diagnosis not present

## 2023-01-05 DIAGNOSIS — R9431 Abnormal electrocardiogram [ECG] [EKG]: Secondary | ICD-10-CM | POA: Diagnosis not present

## 2023-01-05 DIAGNOSIS — N186 End stage renal disease: Secondary | ICD-10-CM | POA: Diagnosis not present

## 2023-01-05 DIAGNOSIS — R112 Nausea with vomiting, unspecified: Secondary | ICD-10-CM

## 2023-01-05 MED ORDER — METOCLOPRAMIDE HCL 5 MG/ML IJ SOLN
10.0000 mg | Freq: Once | INTRAMUSCULAR | Status: AC
  Administered 2023-01-05: 10 mg via INTRAVENOUS
  Filled 2023-01-05: qty 2

## 2023-01-05 NOTE — Discharge Planning (Signed)
Spoke with Thayer Ohm HHD RN, She was successful in speaking and meeting with [facility] yesterday. A home visit is scheduled for Monday 6/17 at 4pm. After this visit, Thayer Ohm will be able to order dialysis machine. This can take up to 3 weeks to deliver. The goal to start Home Training is the week of July 8th -12th. If equipment is delivered sooner than training can start sooner.  Patient will require a hospital bed with a scale prior to starting Home Hemo. This is a must requirement, patient cannot receive dialysis without having a way to be weighted. Also, patient may require a hoyer at home.

## 2023-01-05 NOTE — TOC Progression Note (Signed)
Transition of Care [facility]) - Progression Note    Patient Details  Name: Cynthia Dean MRN: [id number] Date of Birth: [birth year]  [facility]) CM/SW Contact  [name], [place] Phone Number: 01/05/2023, 6:31 PM  Clinical Narrative:     CSW received request that patient will need a bed with a scale and a hoyer lift to be successful at home.  CSW to follow up with DME companies to see if this is something that can be provided.  TOC to continue to follow patient's progress throughout discharge planning.       Expected Discharge Plan and Services                                               Social Determinants of Health (SDOH) Interventions SDOH Screenings   Food Insecurity: No Food Insecurity (10/12/2022)  Housing: Low Risk  (10/12/2022)  Transportation Needs: No Transportation Needs (10/12/2022)  Utilities: Not At Risk (10/12/2022)  Financial Resource Strain: Low Risk  (11/12/2017)  Physical Activity: Insufficiently Active (11/12/2017)  Social Connections: Moderately Integrated (11/12/2017)  Stress: No Stress Concern Present (11/12/2017)  Tobacco Use: Medium Risk (11/16/2022)    Readmission Risk Interventions     No data to display

## 2023-01-05 NOTE — Progress Notes (Signed)
Progress Note   [name] MVH:[id number] DOB: 12-29-1950 DOA: 10/12/2022     85 DOS: the patient was seen and examined on 01/05/2023   Brief hospital course: Bennette Sanna Porcaro is a 72 y.o. 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 She presents to the ED with worsening lower extremity pain. Admitted for possible Discitis/osteomyelitis at L4-L5: w/ suspected intradiscal abscess as per MRI. 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   4/18, HD access / fistula clotted off during dialysis.  4/19 vascular placed R femoral temp HD cath placed, resumed HD and pemcath placed 4/24.  5/14- back pain seems to be well-controlled.  Caseworkers have discussed the case, involved ethics committee/Hospital administration. Looking into home dialysis.  Lacks capacity to make informed decision.    Principal Problem:   Back pain Active Problems:   Anemia   Bilateral leg pain   ESRD on dialysis [facility])   Essential hypertension   Dyslipidemia   Seizure disorder (HCC)   Diabetic retinopathy without macular edema associated with type 2 diabetes mellitus (HCC)   GERD (gastroesophageal reflux disease)   Diabetic neuropathy (HCC)   Anemia of chronic disease   Insulin-requiring or dependent type II diabetes mellitus (HCC)   Discitis of lumbar region   Myositis   Psoas abscess (HCC)   Malnutrition of moderate degree   Knee osteomyelits, right (HCC)   Hypokalemia   Hypophosphatemia   Assessment and Plan:  Nausea and vomiting Prolonged QT interval. Patient started having nausea and vomiting today.  She had soft stools yesterday after giving MiraLAX.  She received a dose of Zofran yesterday.  Reviewed EKG performed in March 2024, patient had a prolonged QT interval.  I will recheck an EKG prior to give  another dose of Zofran. Nausea and vomiting most likely secondary to medications, she is currently on Protonix twice a day.  Right knee pain due to osteoarthritis. Plain right knee pain, x-ray showed mild effusion with osteoarthritis. 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   Wound cx grew staph capitis.   ID consulted. Not a good surgical candidate as per neuro surg.   Pt having severe low back and bilateral leg pain and has not been able to tolerate any position except laying nearly flat in the bed S/p cefazolin, for 6 weeks, completed: 12/02/22 Continue symptomatic treatment with pain medicine (Duragesic patch, Fentanyl IV prn, lidocaine prn while at HD and as needed acetaminophen).   Patient still on oral Keflex for 28 doses starting 12/05/2022   Confusion and agitation - improved Hospital delirium  Improved.   ESRD: on HD MWF.  Anemia of chronic kidney disease. Hypokalemia Hypophosphatemia. Discussed with nephrology, patient will be continued on dialysis, will manage with potassium phosphate over dialysis. Phosphate binder discontinued.   Generalized weakness / Physical Debility:  Not able to place.   Severe back and bilateral leg pain due to severe multi-level lumbar degenerative disc disease. Peripheral neuropathy. Right foot pain. Pt has bulging discs at L2-3, L3-4, L4-5 and L5-S1. Pt will continue to have back and leg pain once antibiotics are complete. Continue pain medicine as above.   COPD: w/o exacerbation.  Continue  on bronchodilators    DM2: well controlled HbA1c 5.2.      Subjective:  Patient is nauseous today, vomited this morning.  No short of breath.  Physical Exam: Vitals:   01/04/23 1838 01/04/23 2015 01/05/23 0420 01/05/23 0756  BP: 111/68  135/61 (!) 102/57 135/73  Pulse: 89 66 84 89  Resp: 18 18 18 18   Temp: 98.8 F (37.1 C) 98.1 F (36.7 C) 98 F (36.7 C) (!) 97.3 F (36.3 C)  TempSrc:   Oral   SpO2: 95% 95% 90% 93%  Weight:      Height:       General exam: Appears calm and comfortable  Respiratory system: Clear to auscultation. Respiratory effort normal. Cardiovascular system: S1 & S2 heard, RRR. No JVD, murmurs, rubs, gallops or clicks. No pedal edema. Gastrointestinal system: Abdomen is nondistended, soft and nontender. No organomegaly or masses felt. Normal bowel sounds heard. Central nervous system: Alert and oriented x2. No focal neurological deficits. Extremities: Symmetric 5 x 5 power. Skin: No rashes, lesions or ulcers Psychiatry: Mood & affect appropriate.    Data Reviewed:  Lab results reviewed.  Family Communication: None  Disposition: Status is: Inpatient Remains inpatient appropriate because: Unsafe discharge option.     Time spent: 35 minutes  Author: Marrion Coy, MD 01/05/2023 11:19 AM  For on call review [email or web address].

## 2023-01-06 DIAGNOSIS — Z992 Dependence on renal dialysis: Secondary | ICD-10-CM | POA: Diagnosis not present

## 2023-01-06 DIAGNOSIS — N186 End stage renal disease: Secondary | ICD-10-CM | POA: Diagnosis not present

## 2023-01-06 DIAGNOSIS — R9431 Abnormal electrocardiogram [ECG] [EKG]: Secondary | ICD-10-CM | POA: Diagnosis not present

## 2023-01-06 DIAGNOSIS — R112 Nausea with vomiting, unspecified: Secondary | ICD-10-CM | POA: Diagnosis not present

## 2023-01-06 LAB — RENAL FUNCTION PANEL
Albumin: 2.8 g/dL — ABNORMAL LOW (ref 3.5–5.0)
Anion gap: 11 (ref 5–15)
BUN: 31 mg/dL — ABNORMAL HIGH (ref 8–23)
CO2: 29 mmol/L (ref 22–32)
Calcium: 8.8 mg/dL — ABNORMAL LOW (ref 8.9–10.3)
Chloride: 99 mmol/L (ref 98–111)
Creatinine, Ser: 5.91 mg/dL — ABNORMAL HIGH (ref 0.44–1.00)
GFR, Estimated: 7 mL/min — ABNORMAL LOW (ref >60)
Glucose, Bld: 109 mg/dL — ABNORMAL HIGH (ref 70–99)
Phosphorus: 2.9 mg/dL (ref 2.5–4.6)
Potassium: 3.6 mmol/L (ref 3.5–5.1)
Sodium: 139 mmol/L (ref 135–145)

## 2023-01-06 LAB — CBC
HCT: 35.5 % — ABNORMAL LOW (ref 36.0–46.0)
Hemoglobin: 10.4 g/dL — ABNORMAL LOW (ref 12.0–15.0)
MCH: 25.9 pg — ABNORMAL LOW (ref 26.0–34.0)
MCHC: 29.3 g/dL — ABNORMAL LOW (ref 30.0–36.0)
MCV: 88.5 fL (ref 80.0–100.0)
Platelets: 198 10*3/uL (ref 150–400)
RBC: 4.01 MIL/uL (ref 3.87–5.11)
RDW: 19 % — ABNORMAL HIGH (ref 11.5–15.5)
WBC: 6 10*3/uL (ref 4.0–10.5)
nRBC: 0 % (ref 0.0–0.2)

## 2023-01-06 LAB — GLUCOSE, CAPILLARY: Glucose-Capillary: 127 mg/dL — ABNORMAL HIGH (ref 70–99)

## 2023-01-06 LAB — HEPATITIS B CORE ANTIBODY, TOTAL: Hep B Core Total Ab: NONREACTIVE

## 2023-01-06 LAB — HEPATITIS B SURFACE ANTIGEN: Hepatitis B Surface Ag: NONREACTIVE

## 2023-01-06 MED ORDER — EPOETIN ALFA 4000 UNIT/ML IJ SOLN
INTRAMUSCULAR | Status: AC
  Filled 2023-01-06: qty 1

## 2023-01-06 MED ORDER — METOCLOPRAMIDE HCL 5 MG/ML IJ SOLN
10.0000 mg | Freq: Once | INTRAMUSCULAR | Status: AC
  Administered 2023-01-06: 10 mg via INTRAVENOUS
  Filled 2023-01-06: qty 2

## 2023-01-06 MED ORDER — HEPARIN SODIUM (PORCINE) 1000 UNIT/ML IJ SOLN
INTRAMUSCULAR | Status: AC
  Filled 2023-01-06: qty 10

## 2023-01-06 NOTE — Progress Notes (Signed)
Progress Note   [name] AOZ:[id number] DOB: 1950/10/01 DOA: 10/12/2022     86 DOS: the patient was seen and examined on 01/06/2023   Brief hospital course: Cynthia Dean is a 72 y.o. 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 She presents to the ED with worsening lower extremity pain. Admitted for possible Discitis/osteomyelitis at L4-L5: w/ suspected intradiscal abscess as per MRI. 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   4/18, HD access / fistula clotted off during dialysis.  4/19 vascular placed R femoral temp HD cath placed, resumed HD and pemcath placed 4/24.  5/14- back pain seems to be well-controlled.  Caseworkers have discussed the case, involved ethics committee/Hospital administration. Looking into home dialysis.  Lacks capacity to make informed decision.    Principal Problem:   Back pain Active Problems:   Anemia   Bilateral leg pain   ESRD on dialysis [facility])   Essential hypertension   Dyslipidemia   Seizure disorder (HCC)   Prolonged QT interval   Nausea & vomiting   Diabetic retinopathy without macular edema associated with type 2 diabetes mellitus (HCC)   GERD (gastroesophageal reflux disease)   Diabetic neuropathy (HCC)   Anemia of chronic disease   Insulin-requiring or dependent type II diabetes mellitus (HCC)   Discitis of lumbar region   Myositis   Psoas abscess (HCC)   Malnutrition of moderate degree   Knee osteomyelits, right (HCC)   Hypokalemia   Hypophosphatemia   Assessment and Plan:  Nausea and vomiting Prolonged QT interval. Patient started having nausea and vomiting.  She had soft stools  after giving MiraLAX.  She received a dose of Zofran yesterday.  Reviewed EKG performed in March 2024, patient had a prolonged QT interval.   Repeat EKG still has significant QT interval prolongation.  Patient was given Reglan x 1 yesterday, nausea vomiting much better today.   Right knee pain due to osteoarthritis. Plain right knee pain, x-ray showed mild effusion with osteoarthritis. 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   Wound cx grew staph capitis.   ID consulted. Not a good surgical candidate as per neuro surg.   Pt having severe low back and bilateral leg pain and has not been able to tolerate any position except laying nearly flat in the bed S/p cefazolin, for 6 weeks, completed: 12/02/22 Continue symptomatic treatment with pain medicine (Duragesic patch, Fentanyl IV prn, lidocaine prn while at HD and as needed acetaminophen).   Patient still on oral Keflex for 28 doses starting 12/05/2022   Confusion and agitation - improved Hospital delirium  Improved.   ESRD: on HD MWF.  Anemia of chronic kidney disease. Hypokalemia Hypophosphatemia. Discussed with nephrology, patient will be continued on dialysis, will manage with potassium phosphate over dialysis. Phosphate binder discontinued. Discussed with TOC, trying to set up home HD, may take until July.  Patient will be in the hospital until then.  Generalized weakness / Physical Debility:  Not able to place.   Severe back and bilateral leg pain due to severe multi-level lumbar degenerative disc disease. Peripheral neuropathy. Right foot pain. Pt has bulging discs at L2-3, L3-4, L4-5 and L5-S1. Pt  will continue to have back and leg pain once antibiotics are complete. Continue pain medicine as above.   COPD: w/o exacerbation.  Continue on bronchodilators    DM2: well controlled HbA1c 5.2.         Subjective:  Patient doing better, no nausea vomiting  today.  Physical Exam: Vitals:   01/06/23 1000 01/06/23 1030 01/06/23 1100 01/06/23 1130  BP: 91/67 (!) 91/59 (!) 83/58 107/64  Pulse: (!) 30 96 97 94  Resp: 17 13 19 10   Temp:      TempSrc:      SpO2: 99% 95% 94% 95%  Weight:      Height:       General exam: Appears calm and comfortable  Respiratory system: Clear to auscultation. Respiratory effort normal. Cardiovascular system: S1 & S2 heard, RRR. No JVD, murmurs, rubs, gallops or clicks. No pedal edema. Gastrointestinal system: Abdomen is nondistended, soft and nontender. No organomegaly or masses felt. Normal bowel sounds heard. Central nervous system: Alert and oriented x2. No focal neurological deficits. Extremities: Symmetric 5 x 5 power. Skin: No rashes, lesions or ulcers Psychiatry: Judgement and insight appear normal. Mood & affect appropriate.    Data Reviewed:  There are no new results to review at this time.  Family Communication: None  Disposition: Status is: Inpatient Remains inpatient appropriate because: Unsafe discharge option.     Time spent: 35 minutes  Author: Marrion Coy, MD 01/06/2023 12:03 PM  For on call review [email or web address].

## 2023-01-06 NOTE — Progress Notes (Signed)
[facility]  PROGRESS NOTE   Subjective:   Patient seen and evaluated during dialysis   HEMODIALYSIS FLOWSHEET:  Blood Flow Rate (mL/min): 400 mL/min Arterial Pressure (mmHg): -120 mmHg Venous Pressure (mmHg): 180 mmHg TMP (mmHg): 4 mmHg Ultrafiltration Rate (mL/min): 400 mL/min Dialysate Flow Rate (mL/min): 300 ml/min Dialysis Fluid Bolus: Normal Saline Bolus Amount (mL): 300 mL  Receiving treatment in bed Tolerating treatment well  Objective:  Vital signs: Blood pressure 91/67, pulse (!) 30, temperature 98.2 F (36.8 C), temperature source Oral, resp. rate 17, height 6' (1.829 m), weight 84.6 kg, SpO2 99 %.  Intake/Output Summary (Last 24 hours) at 01/06/2023 1016 Last data filed at 01/05/2023 2110 Gross per 24 hour  Intake 240 ml  Output 1 ml  Net 239 ml    Filed Weights   01/04/23 0826 01/04/23 1228 01/06/23 0824  Weight: 85 kg 84.5 kg 84.6 kg     Physical Exam: General:  No acute distress, laying in bed  Head:  Normocephalic, atraumatic. Moist oral mucosal membranes  Eyes:  Anicteric  Lungs:   Clear to auscultation, normal effort  Heart:  Irregular  Abdomen:   Soft, nontender, bowel sounds present  Extremities:  No peripheral edema.  Neurologic:  Alert and oriented to self and place  Skin:  No lesions  Access:  [facility] permcath    Basic Metabolic Panel: Recent Labs  Lab 12/30/22 1409 01/02/23 0840 01/03/23 0900 01/04/23 0840 01/06/23 0856  NA 138 138 138 136 139  K 3.8 3.6 3.8 3.4* 3.6  CL 98 99 100 97* 99  CO2 26 27 25 29 29   GLUCOSE 132* 116* 100* 87 109*  BUN 31* 40* 48* 28* 31*  CREATININE 6.74* 7.49* 7.89* 4.93* 5.91*  CALCIUM 8.3* 8.5* 8.6* 7.4* 8.8*  PHOS 2.5 2.1* 2.6 1.9* 2.9    GFR: Estimated Creatinine Clearance: 10.1 mL/min (A) (by C-G formula based on SCr of 5.91 mg/dL (H)).  Liver Function Tests: Recent Labs  Lab 12/30/22 1409 01/02/23 0840 01/03/23 0900 01/04/23 0840 01/06/23 0856  ALBUMIN 2.9* 2.8* 2.8*  2.8* 2.8*    No results for input(s): "LIPASE", "AMYLASE" in the last 168 hours. No results for input(s): "AMMONIA" in the last 168 hours.  CBC: Recent Labs  Lab 12/30/22 1409 01/02/23 0840 01/03/23 0900 01/04/23 0840 01/06/23 0856  WBC 6.1 6.4 6.1 6.5 6.0  NEUTROABS  --  3.1  --   --   --   HGB 10.3* 10.1* 10.1* 10.3* 10.4*  HCT 34.2* 34.0* 34.2* 34.2* 35.5*  MCV 87.2 87.6 87.0 86.6 88.5  PLT 238 219 219 210 198      HbA1C: Hemoglobin A1C  Date/Time Value Ref Range Status  06/15/2014 12:58 AM 7.9 (H) 4.2 - 6.3 % Final    Comment:    The American Diabetes Association recommends that a primary goal of therapy should be <7% and that physicians should reevaluate the treatment regimen in patients with HbA1c values consistently >8%.   12/24/2013 11:18 AM 8.2 (H) 4.2 - 6.3 % Final    Comment:    The American Diabetes Association recommends that a primary goal of therapy should be <7% and that physicians should reevaluate the treatment regimen in patients with HbA1c values consistently >8%.    Hgb A1c MFr Bld  Date/Time Value Ref Range Status  10/13/2022 11:46 AM 5.2 4.8 - 5.6 % Final    Comment:    (NOTE)         Prediabetes: 5.7 -  6.4         Diabetes: >6.4         Glycemic control for adults with diabetes: <7.0   03/23/2022 06:47 AM 5.8 (H) 4.8 - 5.6 % Final    Comment:    (NOTE) Pre diabetes:          5.7%-6.4%  Diabetes:              >6.4%  Glycemic control for   <7.0% adults with diabetes     Urinalysis: No results for input(s): "COLORURINE", "LABSPEC", "PHURINE", "GLUCOSEU", "HGBUR", "BILIRUBINUR", "KETONESUR", "PROTEINUR", "UROBILINOGEN", "NITRITE", "LEUKOCYTESUR" in the last 72 hours.  Invalid input(s): "APPERANCEUR"    Imaging: No results found.   Medications:    sodium chloride Stopped (11/16/22 1155)   anticoagulant sodium citrate      (feeding supplement) PROSource Plus  30 mL Oral TID BM   Chlorhexidine Gluconate Cloth  6 each  Topical Q0600   cinacalcet  60 mg Oral Q supper   epoetin (EPOGEN/PROCRIT) injection  4,000 Units Intravenous Q M,W,F-HD   feeding supplement (NEPRO CARB STEADY)  237 mL Oral TID BM   fentaNYL  1 patch Transdermal Q72H   gabapentin  100 mg Oral Once per day on Mon Wed Fri   heparin injection (subcutaneous)  5,000 Units Subcutaneous Q8H   levETIRAcetam  1,000 mg Oral Daily   levETIRAcetam  250 mg Oral Q M,W,F   lidocaine  1 patch Transdermal Q24H   pantoprazole  40 mg Oral BID   QUEtiapine  25 mg Oral QHS   sertraline  25 mg Oral Daily   sodium chloride flush  10-40 mL Intracatheter Q12H   umeclidinium-vilanterol  1 puff Inhalation Daily    Assessment/ Plan:     Ms. Caton Wohlwend is a 72 y.o. female with end stage renal disease on hemodialysis, CAD, COPD with intermittent home O2, hypertension, hyperlipidemia, stroke, diabetes who has been admitted since 3/20. Initially admitted for vertebral osteomyelitis. She has completed IV antibiotics. Now is working on placement.   Due to extended hospitalization, patient will need outpatient dialysis clinic re-initiated.   [facility] T[facility] permcath   #1 ESRD:  -Receiving dialysis today, UF 0.5-1L as tolerated.  - Next treatment scheduled for Monday -Renal navigator able to contact HCPOA, currently arranging home dialysis nurse visit for evaluation.   #2: Anemia with chronic kidney disease: hemoglobin remains 10.4 -No need for ESA at this time  #3: Second hyperparathyroidism: With hypercalcemia Lab Results  Component Value Date   PTH 157 (H) 12/08/2022   CALCIUM 8.8 (L) 01/06/2023   CAION 1.09 (L) 08/28/2022   PHOS 2.9 01/06/2023   - Zolmeta 4mg  IV on 12/22/2022 -Corrected calcium 8.8 -Continue Cinacalcet to 60 mg daily. -Hypophosphatemia, will hold binders.   #4: Hypertension  Not currently on any blood pressure agents.  Blood pressure 9067 during dialysis.    LOS: [id number] [facility] 6/14/202410:16 AM

## 2023-01-06 NOTE — Progress Notes (Signed)
Hemodialysis Note  Received patient in bed to unit. Alert and oriented. Informed consent signed and in chart.   Treatment initiated:0833 Treatment completed:1241  Patient tolerated treatment well. Transported back to the room alert, without acute distress. Report given to patient's RN.  Access used: [facility]  Access issues:None   Total UF removed:400 ml  Medications given:  Epogen, Heparin  Post HD VS: Stable Post HD weight:84.2 kg bed weight   Bartolo Darter, RN  [facility]

## 2023-01-07 DIAGNOSIS — N186 End stage renal disease: Secondary | ICD-10-CM | POA: Diagnosis not present

## 2023-01-07 DIAGNOSIS — R112 Nausea with vomiting, unspecified: Secondary | ICD-10-CM | POA: Diagnosis not present

## 2023-01-07 DIAGNOSIS — Z992 Dependence on renal dialysis: Secondary | ICD-10-CM | POA: Diagnosis not present

## 2023-01-07 DIAGNOSIS — I10 Essential (primary) hypertension: Secondary | ICD-10-CM | POA: Diagnosis not present

## 2023-01-07 LAB — HEPATITIS B SURFACE ANTIBODY, QUANTITATIVE: Hep B S AB Quant (Post): 17.1 m[IU]/mL (ref >9.9)

## 2023-01-07 MED ORDER — METOCLOPRAMIDE HCL 5 MG/ML IJ SOLN: 10.0000 mg | Freq: Every day | INTRAMUSCULAR | Status: DC | PRN

## 2023-01-07 NOTE — Progress Notes (Signed)
Progress Note   [name] NWG:[id number] DOB: [birth year] DOA: 10/12/2022     87 DOS: the patient was seen and examined on 01/07/2023   Brief hospital course: Cynthia Dean is a 72 y.o. 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 She presents to the ED with worsening lower extremity pain. Admitted for possible Discitis/osteomyelitis at L4-L5: w/ suspected intradiscal abscess as per MRI. 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   4/18, HD access / fistula clotted off during dialysis.  4/19 vascular placed R femoral temp HD cath placed, resumed HD and pemcath placed 4/24.  5/14- back pain seems to be well-controlled.  Caseworkers have discussed the case, involved ethics committee/Hospital administration. Looking into home dialysis.  Lacks capacity to make informed decision.    Principal Problem:   Back pain Active Problems:   Anemia   Bilateral leg pain   ESRD on dialysis [facility])   Essential hypertension   Dyslipidemia   Seizure disorder (HCC)   Prolonged QT interval   Nausea & vomiting   Diabetic retinopathy without macular edema associated with type 2 diabetes mellitus (HCC)   GERD (gastroesophageal reflux disease)   Diabetic neuropathy (HCC)   Anemia of chronic disease   Insulin-requiring or dependent type II diabetes mellitus (HCC)   Discitis of lumbar region   Myositis   Psoas abscess (HCC)   Malnutrition of moderate degree   Knee osteomyelits, right (HCC)   Hypokalemia   Hypophosphatemia   Assessment and Plan: Nausea and vomiting Prolonged QT interval. Patient still has significant nausea vomiting occasionally, she has been having daily bowel movements.  No diarrhea.  No abdominal pain.  I will avoid the Zofran.  Patient seem to be responding to Reglan better.   She probably has gastroparesis.  I placed on Reglan 10 mg IV daily as needed.   Right knee pain due to osteoarthritis. Plain right knee pain, x-ray showed mild effusion with osteoarthritis. 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   Wound cx grew staph capitis.   ID consulted. Not a good surgical candidate as per neuro surg.   Pt having severe low back and bilateral leg pain and has not been able to tolerate any position except laying nearly flat in the bed S/p cefazolin, for 6 weeks, completed: 12/02/22 Continue symptomatic treatment with pain medicine (Duragesic patch, Fentanyl IV prn, lidocaine prn while at HD and as needed acetaminophen).   Patient still on oral Keflex for 28 doses starting 12/05/2022   Confusion and agitation - improved Hospital delirium  Improved.   ESRD: on HD MWF.  Anemia of chronic kidney disease. Hypokalemia Hypophosphatemia. Discussed with nephrology, patient will be continued on dialysis, will manage with potassium phosphate over dialysis. Phosphate binder discontinued. Discussed with TOC, trying to set up home HD, may take until July.  Patient will be in the hospital until then.   Generalized weakness / Physical Debility:  Not able to place.   Severe back and bilateral leg pain due to severe multi-level lumbar degenerative disc disease. Peripheral neuropathy. Right foot pain. Pt has bulging discs at L2-3, L3-4, L4-5 and L5-S1. Pt will continue to have back and  leg pain once antibiotics are complete. Continue pain medicine as above.   COPD: w/o exacerbation.  Continue on bronchodilators    DM2: well controlled HbA1c 5.2.        Subjective:  Patient still has intermittent nausea vomiting.  She does not have constipation or diarrhea.  No  abdominal pain.  Physical Exam: Vitals:   01/06/23 1808 01/06/23 1952 01/07/23 0542 01/07/23 0904  BP: 139/74 (!) 112/59 137/76 103/60  Pulse: 96 99 98 72  Resp: 16 16 18 20   Temp: 98.2 F (36.8 C) 97.8 F (36.6 C) 97.8 F (36.6 C) 97.7 F (36.5 C)  TempSrc: Oral   Oral  SpO2: 100% 96% 97% 92%  Weight:      Height:       General exam: Appears calm and comfortable  Respiratory system: Clear to auscultation. Respiratory effort normal. Cardiovascular system: S1 & S2 heard, RRR. No JVD, murmurs, rubs, gallops or clicks. No pedal edema. Gastrointestinal system: Abdomen is nondistended, soft and nontender. No organomegaly or masses felt. Normal bowel sounds heard. Central nervous system: Alert and oriented x2. No focal neurological deficits. Extremities: Symmetric 5 x 5 power. Skin: No rashes, lesions or ulcers Psychiatry: Mood & affect appropriate.    Data Reviewed:  Lab results reviewed.  Family Communication: None  Disposition: Status is: Inpatient Remains inpatient appropriate because: Unsafe discharge.     Time spent: 35 minutes  Author: Marrion Coy, MD 01/07/2023 10:09 AM  For on call review [email or web address].

## 2023-01-07 NOTE — Progress Notes (Signed)
[facility]  PROGRESS NOTE   Subjective:   Patient seen at bedside comfortable.  Objective:  Vital signs: Blood pressure 103/60, pulse 72, temperature 97.7 F (36.5 C), temperature source Oral, resp. rate 20, height 6' (1.829 m), weight 84.2 kg, SpO2 92 %. No intake or output data in the 24 hours ending 01/07/23 1557 Filed Weights   01/04/23 1228 01/06/23 0824 01/06/23 1249  Weight: 84.5 kg 84.6 kg 84.2 kg     Physical Exam: General:  No acute distress  Head:  Normocephalic, atraumatic. Moist oral mucosal membranes  Eyes:  Anicteric  Neck:  Supple  Lungs:   Clear to auscultation, normal effort  Heart:  S1S2 no rubs  Abdomen:   Soft, nontender, bowel sounds present  Extremities:  peripheral edema.  Neurologic:  Awake, alert, following commands  Skin:  No lesions  Access:     Basic Metabolic Panel: Recent Labs  Lab 01/02/23 0840 01/03/23 0900 01/04/23 0840 01/06/23 0856  NA 138 138 136 139  K 3.6 3.8 3.4* 3.6  CL 99 100 97* 99  CO2 27 25 29 29   GLUCOSE 116* 100* 87 109*  BUN 40* 48* 28* 31*  CREATININE 7.49* 7.89* 4.93* 5.91*  CALCIUM 8.5* 8.6* 7.4* 8.8*  PHOS 2.1* 2.6 1.9* 2.9   GFR: Estimated Creatinine Clearance: 10.1 mL/min (A) (by C-G formula based on SCr of 5.91 mg/dL (H)).  Liver Function Tests: Recent Labs  Lab 01/02/23 0840 01/03/23 0900 01/04/23 0840 01/06/23 0856  ALBUMIN 2.8* 2.8* 2.8* 2.8*   No results for input(s): "LIPASE", "AMYLASE" in the last 168 hours. No results for input(s): "AMMONIA" in the last 168 hours.  CBC: Recent Labs  Lab 01/02/23 0840 01/03/23 0900 01/04/23 0840 01/06/23 0856  WBC 6.4 6.1 6.5 6.0  NEUTROABS 3.1  --   --   --   HGB 10.1* 10.1* 10.3* 10.4*  HCT 34.0* 34.2* 34.2* 35.5*  MCV 87.6 87.0 86.6 88.5  PLT 219 219 210 198     HbA1C: Hemoglobin A1C  Date/Time Value Ref Range Status  06/15/2014 12:58 AM 7.9 (H) 4.2 - 6.3 % Final    Comment:    The American Diabetes Association recommends  that a primary goal of therapy should be <7% and that physicians should reevaluate the treatment regimen in patients with HbA1c values consistently >8%.   12/24/2013 11:18 AM 8.2 (H) 4.2 - 6.3 % Final    Comment:    The American Diabetes Association recommends that a primary goal of therapy should be <7% and that physicians should reevaluate the treatment regimen in patients with HbA1c values consistently >8%.    Hgb A1c MFr Bld  Date/Time Value Ref Range Status  10/13/2022 11:46 AM 5.2 4.8 - 5.6 % Final    Comment:    (NOTE)         Prediabetes: 5.7 - 6.4         Diabetes: >6.4         Glycemic control for adults with diabetes: <7.0   03/23/2022 06:47 AM 5.8 (H) 4.8 - 5.6 % Final    Comment:    (NOTE) Pre diabetes:          5.7%-6.4%  Diabetes:              >6.4%  Glycemic control for   <7.0% adults with diabetes     Urinalysis: No results for input(s): "COLORURINE", "LABSPEC", "PHURINE", "GLUCOSEU", "HGBUR", "BILIRUBINUR", "KETONESUR", "PROTEINUR", "UROBILINOGEN", "NITRITE", "LEUKOCYTESUR" in the last 72  hours.  Invalid input(s): "APPERANCEUR"    Imaging: No results found.   Medications:    sodium chloride Stopped (11/16/22 1155)   anticoagulant sodium citrate      (feeding supplement) PROSource Plus  30 mL Oral TID BM   Chlorhexidine Gluconate Cloth  6 each Topical Q0600   cinacalcet  60 mg Oral Q supper   epoetin (EPOGEN/PROCRIT) injection  4,000 Units Intravenous Q M,W,F-HD   feeding supplement (NEPRO CARB STEADY)  237 mL Oral TID BM   fentaNYL  1 patch Transdermal Q72H   gabapentin  100 mg Oral Once per day on Mon Wed Fri   heparin injection (subcutaneous)  5,000 Units Subcutaneous Q8H   levETIRAcetam  1,000 mg Oral Daily   levETIRAcetam  250 mg Oral Q M,W,F   lidocaine  1 patch Transdermal Q24H   pantoprazole  40 mg Oral BID   QUEtiapine  25 mg Oral QHS   sertraline  25 mg Oral Daily   sodium chloride flush  10-40 mL Intracatheter Q12H    umeclidinium-vilanterol  1 puff Inhalation Daily    Assessment/ Plan:     [name] is a 72 y.o. female with end stage renal disease on hemodialysis, CAD, COPD with intermittent home O2, hypertension, hyperlipidemia, stroke, diabetes who has been admitted since 3/20. Initially admitted for vertebral osteomyelitis. She has completed IV antibiotics. Now is working on placement.   #1: ESRD: Patient is stable dialysis yesterday.  Waiting for placement.  #2: Anemia: Continue anemia protocol with Epogen.  #3: Secondary hyperparathyroidism: Continue to monitor labs closely.    LOS: 87 Lorain Childes, MD [facility] 6/15/20243:57 PM

## 2023-01-08 DIAGNOSIS — Z992 Dependence on renal dialysis: Secondary | ICD-10-CM | POA: Diagnosis not present

## 2023-01-08 DIAGNOSIS — I10 Essential (primary) hypertension: Secondary | ICD-10-CM | POA: Diagnosis not present

## 2023-01-08 DIAGNOSIS — N186 End stage renal disease: Secondary | ICD-10-CM | POA: Diagnosis not present

## 2023-01-08 MED ORDER — LORAZEPAM 0.5 MG PO TABS: 0.5000 mg | ORAL_TABLET | ORAL | Status: DC | PRN

## 2023-01-08 MED ORDER — OLANZAPINE 5 MG PO TBDP
2.5000 mg | ORAL_TABLET | Freq: Four times a day (QID) | ORAL | Status: DC | PRN
  Administered 2023-01-09 – 2023-01-11 (×2): 2.5 mg via ORAL
  Filled 2023-01-08 (×3): qty 0.5

## 2023-01-08 NOTE — Progress Notes (Signed)
[facility]  PROGRESS NOTE   Subjective:   Comfortable.  Objective:  Vital signs: Blood pressure (!) 152/79, pulse 98, temperature 98.4 F (36.9 C), temperature source Oral, resp. rate 16, height 6' (1.829 m), weight 84.2 kg, SpO2 99 %.  Intake/Output Summary (Last 24 hours) at 01/08/2023 2006 Last data filed at 01/08/2023 1914 Gross per 24 hour  Intake 120 ml  Output --  Net 120 ml   Filed Weights   01/04/23 1228 01/06/23 0824 01/06/23 1249  Weight: 84.5 kg 84.6 kg 84.2 kg     Physical Exam: General:  No acute distress  Head:  Normocephalic, atraumatic. Moist oral mucosal membranes  Eyes:  Anicteric  Neck:  Supple  Lungs:   Clear to auscultation, normal effort  Heart:  S1S2 no rubs  Abdomen:   Soft, nontender, bowel sounds present  Extremities:  peripheral edema.  Neurologic:  Awake, alert, following commands  Skin:  No lesions  Access:     Basic Metabolic Panel: Recent Labs  Lab 01/02/23 0840 01/03/23 0900 01/04/23 0840 01/06/23 0856  NA 138 138 136 139  K 3.6 3.8 3.4* 3.6  CL 99 100 97* 99  CO2 27 25 29 29   GLUCOSE 116* 100* 87 109*  BUN 40* 48* 28* 31*  CREATININE 7.49* 7.89* 4.93* 5.91*  CALCIUM 8.5* 8.6* 7.4* 8.8*  PHOS 2.1* 2.6 1.9* 2.9   GFR: Estimated Creatinine Clearance: 10.1 mL/min (A) (by C-G formula based on SCr of 5.91 mg/dL (H)).  Liver Function Tests: Recent Labs  Lab 01/02/23 0840 01/03/23 0900 01/04/23 0840 01/06/23 0856  ALBUMIN 2.8* 2.8* 2.8* 2.8*   No results for input(s): "LIPASE", "AMYLASE" in the last 168 hours. No results for input(s): "AMMONIA" in the last 168 hours.  CBC: Recent Labs  Lab 01/02/23 0840 01/03/23 0900 01/04/23 0840 01/06/23 0856  WBC 6.4 6.1 6.5 6.0  NEUTROABS 3.1  --   --   --   HGB 10.1* 10.1* 10.3* 10.4*  HCT 34.0* 34.2* 34.2* 35.5*  MCV 87.6 87.0 86.6 88.5  PLT 219 219 210 198     HbA1C: Hemoglobin A1C  Date/Time Value Ref Range Status  06/15/2014 12:58 AM 7.9 (H) 4.2 -  6.3 % Final    Comment:    The American Diabetes Association recommends that a primary goal of therapy should be <7% and that physicians should reevaluate the treatment regimen in patients with HbA1c values consistently >8%.   12/24/2013 11:18 AM 8.2 (H) 4.2 - 6.3 % Final    Comment:    The American Diabetes Association recommends that a primary goal of therapy should be <7% and that physicians should reevaluate the treatment regimen in patients with HbA1c values consistently >8%.    Hgb A1c MFr Bld  Date/Time Value Ref Range Status  10/13/2022 11:46 AM 5.2 4.8 - 5.6 % Final    Comment:    (NOTE)         Prediabetes: 5.7 - 6.4         Diabetes: >6.4         Glycemic control for adults with diabetes: <7.0   03/23/2022 06:47 AM 5.8 (H) 4.8 - 5.6 % Final    Comment:    (NOTE) Pre diabetes:          5.7%-6.4%  Diabetes:              >6.4%  Glycemic control for   <7.0% adults with diabetes     Urinalysis: No results  for input(s): "COLORURINE", "LABSPEC", "PHURINE", "GLUCOSEU", "HGBUR", "BILIRUBINUR", "KETONESUR", "PROTEINUR", "UROBILINOGEN", "NITRITE", "LEUKOCYTESUR" in the last 72 hours.  Invalid input(s): "APPERANCEUR"    Imaging: No results found.   Medications:    sodium chloride Stopped (11/16/22 1155)   anticoagulant sodium citrate      (feeding supplement) PROSource Plus  30 mL Oral TID BM   Chlorhexidine Gluconate Cloth  6 each Topical Q0600   cinacalcet  60 mg Oral Q supper   epoetin (EPOGEN/PROCRIT) injection  4,000 Units Intravenous Q M,W,F-HD   feeding supplement (NEPRO CARB STEADY)  237 mL Oral TID BM   fentaNYL  1 patch Transdermal Q72H   gabapentin  100 mg Oral Once per day on Mon Wed Fri   heparin injection (subcutaneous)  5,000 Units Subcutaneous Q8H   levETIRAcetam  1,000 mg Oral Daily   levETIRAcetam  250 mg Oral Q M,W,F   lidocaine  1 patch Transdermal Q24H   pantoprazole  40 mg Oral BID   QUEtiapine  25 mg Oral QHS   sertraline  25 mg Oral  Daily   sodium chloride flush  10-40 mL Intracatheter Q12H   umeclidinium-vilanterol  1 puff Inhalation Daily    Assessment/ Plan:     [name] is a 72 y.o. female with end stage renal disease on hemodialysis, CAD, COPD with intermittent home O2, hypertension, hyperlipidemia, stroke, diabetes who has been admitted since 3/20. Initially admitted for vertebral osteomyelitis. She has completed IV antibiotics. Now is working on placement.    #1: ESRD: Patient is stable dialysis Friday. Will schedule for HD in AM. Waiting for placement.   #2: Anemia: Continue anemia protocol with Epogen.   #3: Secondary hyperparathyroidism: Continue to monitor labs closely.  Discharge planning.   LOS: 88 Lorain Childes, MD [facility] 6/16/20248:06 PM

## 2023-01-08 NOTE — Progress Notes (Signed)
Progress Note   [name] ZOX:[id number] DOB: 01/11/1951 DOA: 10/12/2022     88 DOS: the patient was seen and examined on 01/08/2023   Brief hospital course: Cynthia Dean is a 72 y.o. 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 She presents to the ED with worsening lower extremity pain. Admitted for possible Discitis/osteomyelitis at L4-L5: w/ suspected intradiscal abscess as per MRI. 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   4/18, HD access / fistula clotted off during dialysis.  4/19 vascular placed R femoral temp HD cath placed, resumed HD and pemcath placed 4/24.  5/14- back pain seems to be well-controlled.  Caseworkers have discussed the case, involved ethics committee/Hospital administration. Looking into home dialysis.  Lacks capacity to make informed decision.    Principal Problem:   Back pain Active Problems:   Anemia   Bilateral leg pain   ESRD on dialysis [facility])   Essential hypertension   Dyslipidemia   Seizure disorder (HCC)   Prolonged QT interval   Nausea & vomiting   Diabetic retinopathy without macular edema associated with type 2 diabetes mellitus (HCC)   GERD (gastroesophageal reflux disease)   Diabetic neuropathy (HCC)   Anemia of chronic disease   Insulin-requiring or dependent type II diabetes mellitus (HCC)   Discitis of lumbar region   Myositis   Psoas abscess (HCC)   Malnutrition of moderate degree   Knee osteomyelits, right (HCC)   Hypokalemia   Hypophosphatemia   Assessment and Plan:  Nausea and vomiting Prolonged QT interval. Patient still has significant nausea vomiting occasionally, she has been having daily bowel movements.  No diarrhea.  No abdominal pain.  I will avoid the Zofran.  Patient seem to be responding to Reglan better.   She probably has gastroparesis.  I placed on Reglan 10 mg IV daily as needed.   Right knee pain due to osteoarthritis. Plain right knee pain, x-ray showed mild effusion with osteoarthritis. 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   Wound cx grew staph capitis.   ID consulted. Not a good surgical candidate as per neuro surg.   Pt having severe low back and bilateral leg pain and has not been able to tolerate any position except laying nearly flat in the bed S/p cefazolin, for 6 weeks, completed: 12/02/22 Continue symptomatic treatment with pain medicine (Duragesic patch, Fentanyl IV prn, lidocaine prn while at HD and as needed acetaminophen).   Patient still on oral Keflex for 28 doses starting 12/05/2022   Confusion and agitation - improved Hospital delirium  Improved.   ESRD: on HD MWF.  Anemia of chronic kidney disease. Hypokalemia Hypophosphatemia. Discussed with nephrology, patient will be continued on dialysis, will manage with potassium phosphate over dialysis. Phosphate binder discontinued. Discussed with TOC, trying to set up home HD, may take until July.  Patient will be in the hospital until then.   Generalized weakness / Physical Debility:  Not able to place.   Severe back and bilateral leg pain due to severe multi-level lumbar degenerative disc disease. Peripheral neuropathy. Right foot pain. Pt has bulging discs at L2-3, L3-4, L4-5 and L5-S1. Pt will continue to have back  and leg pain once antibiotics are complete. Continue pain medicine as above.   COPD: w/o exacerbation.  Continue on bronchodilators    DM2: well controlled HbA1c 5.2.   Patient condition stable, no change in treatment plan.     Subjective: Patient has no complaint.  Physical  Exam: Vitals:   01/07/23 1637 01/07/23 1959 01/08/23 0508 01/08/23 0824  BP: 115/64 115/68 (!) 146/84 (!) 157/42  Pulse: 88 94 96 86  Resp: 16 20 16 16   Temp: (!) 97.5 F (36.4 C) 97.6 F (36.4 C) 98.4 F (36.9 C) 98.4 F (36.9 C)  TempSrc: Oral     SpO2: 92% 97% 100% 100%  Weight:      Height:       General exam: Appears calm and comfortable  Respiratory system: Clear to auscultation. Respiratory effort normal. Cardiovascular system: S1 & S2 heard, RRR. No JVD, murmurs, rubs, gallops or clicks. No pedal edema. Gastrointestinal system: Abdomen is nondistended, soft and nontender. No organomegaly or masses felt. Normal bowel sounds heard. Central nervous system: Alert and oriented x1. No focal neurological deficits. Extremities: Symmetric 5 x 5 power. Skin: No rashes, lesions or ulcers Psychiatry: Mood & affect appropriate.    Data Reviewed:  There are no new results to review at this time.  Family Communication: None  Disposition: Status is: Inpatient Remains inpatient appropriate because: Unsafe discharge plan.     Time spent: 25 minutes  Author: Marrion Coy, MD 01/08/2023 2:03 PM  For on call review [email or web address].

## 2023-01-09 ENCOUNTER — Inpatient Hospital Stay: Payer: 59

## 2023-01-09 DIAGNOSIS — R112 Nausea with vomiting, unspecified: Secondary | ICD-10-CM | POA: Diagnosis not present

## 2023-01-09 DIAGNOSIS — G40909 Epilepsy, unspecified, not intractable, without status epilepticus: Secondary | ICD-10-CM | POA: Diagnosis not present

## 2023-01-09 DIAGNOSIS — M4646 Discitis, unspecified, lumbar region: Secondary | ICD-10-CM | POA: Diagnosis not present

## 2023-01-09 LAB — RENAL FUNCTION PANEL
Albumin: 3.1 g/dL — ABNORMAL LOW (ref 3.5–5.0)
Anion gap: 12 (ref 5–15)
BUN: 35 mg/dL — ABNORMAL HIGH (ref 8–23)
CO2: 26 mmol/L (ref 22–32)
Calcium: 9.6 mg/dL (ref 8.9–10.3)
Chloride: 102 mmol/L (ref 98–111)
Creatinine, Ser: 7.35 mg/dL — ABNORMAL HIGH (ref 0.44–1.00)
GFR, Estimated: 5 mL/min — ABNORMAL LOW (ref >60)
Glucose, Bld: 135 mg/dL — ABNORMAL HIGH (ref 70–99)
Phosphorus: 3.4 mg/dL (ref 2.5–4.6)
Potassium: 3.5 mmol/L (ref 3.5–5.1)
Sodium: 140 mmol/L (ref 135–145)

## 2023-01-09 LAB — CBC
HCT: 35.2 % — ABNORMAL LOW (ref 36.0–46.0)
Hemoglobin: 10.7 g/dL — ABNORMAL LOW (ref 12.0–15.0)
MCH: 26.4 pg (ref 26.0–34.0)
MCHC: 30.4 g/dL (ref 30.0–36.0)
MCV: 86.7 fL (ref 80.0–100.0)
Platelets: 235 10*3/uL (ref 150–400)
RBC: 4.06 MIL/uL (ref 3.87–5.11)
RDW: 19.2 % — ABNORMAL HIGH (ref 11.5–15.5)
WBC: 7.3 10*3/uL (ref 4.0–10.5)
nRBC: 0 % (ref 0.0–0.2)

## 2023-01-09 MED ORDER — EPOETIN ALFA 4000 UNIT/ML IJ SOLN
INTRAMUSCULAR | Status: AC
  Filled 2023-01-09: qty 1

## 2023-01-09 MED ORDER — ANTICOAGULANT SODIUM CITRATE 4% (200MG/5ML) IV SOLN: 5.0000 mL | Status: DC | PRN

## 2023-01-09 MED ORDER — HEPARIN SODIUM (PORCINE) 1000 UNIT/ML DIALYSIS: 100.0000 [IU]/kg | INTRAMUSCULAR | Status: DC | PRN

## 2023-01-09 MED ORDER — LIDOCAINE-PRILOCAINE 2.5-2.5 % EX CREA: 1.0000 | TOPICAL_CREAM | CUTANEOUS | Status: DC | PRN

## 2023-01-09 MED ORDER — LIDOCAINE HCL (PF) 1 % IJ SOLN: 5.0000 mL | INTRAMUSCULAR | Status: DC | PRN

## 2023-01-09 MED ORDER — ALTEPLASE 2 MG IJ SOLR: 2.0000 mg | Freq: Once | INTRAMUSCULAR | Status: DC | PRN

## 2023-01-09 MED ORDER — PENTAFLUOROPROP-TETRAFLUOROETH EX AERO: 1.0000 | INHALATION_SPRAY | CUTANEOUS | Status: DC | PRN

## 2023-01-09 MED ORDER — CHLORHEXIDINE GLUCONATE CLOTH 2 % EX PADS
6.0000 | MEDICATED_PAD | Freq: Every day | CUTANEOUS | Status: DC
  Administered 2023-01-09: 6 via TOPICAL

## 2023-01-09 MED ORDER — HEPARIN SODIUM (PORCINE) 1000 UNIT/ML DIALYSIS: 1000.0000 [IU] | INTRAMUSCULAR | Status: DC | PRN

## 2023-01-09 NOTE — Progress Notes (Signed)
[facility]  PROGRESS NOTE   Subjective:   Patient seen and evaluated during dialysis   HEMODIALYSIS FLOWSHEET:  Blood Flow Rate (mL/min): 400 mL/min Arterial Pressure (mmHg): -230 mmHg Venous Pressure (mmHg): 270 mmHg TMP (mmHg): 0 mmHg Ultrafiltration Rate (mL/min): 0 mL/min Dialysate Flow Rate (mL/min): 300 ml/min Dialysis Fluid Bolus: Normal Saline Bolus Amount (mL): 100 mL  Patient tolerated treatment well Confused, tearful, speaking with persons not in the room.  Objective:  Vital signs: Blood pressure 125/64, pulse 99, temperature 97.9 F (36.6 C), resp. rate 20, height 6' (1.829 m), weight 82.3 kg, SpO2 100 %.  Intake/Output Summary (Last 24 hours) at 01/09/2023 1312 Last data filed at 01/09/2023 1140 Gross per 24 hour  Intake 120 ml  Output 1300 ml  Net -1180 ml    Filed Weights   01/06/23 1249 01/09/23 0744 01/09/23 1212  Weight: 84.2 kg 85.1 kg 82.3 kg     Physical Exam: General: Tearful  Head:  Normocephalic, atraumatic. Moist oral mucosal membranes  Eyes:  Anicteric  Lungs:   Clear to auscultation, normal effort  Heart:  Irregular  Abdomen:   Soft, nontender, bowel sounds present  Extremities:  No peripheral edema.  Neurologic:  Alert and oriented to self   Skin:  No lesions  Access:  LIJ permcath    Basic Metabolic Panel: Recent Labs  Lab 01/03/23 0900 01/04/23 0840 01/06/23 0856 01/09/23 0805  NA 138 136 139 140  K 3.8 3.4* 3.6 3.5  CL 100 97* 99 102  CO2 25 29 29 26   GLUCOSE 100* 87 109* 135*  BUN 48* 28* 31* 35*  CREATININE 7.89* 4.93* 5.91* 7.35*  CALCIUM 8.6* 7.4* 8.8* 9.6  PHOS 2.6 1.9* 2.9 3.4    GFR: Estimated Creatinine Clearance: 8.1 mL/min (A) (by C-G formula based on SCr of 7.35 mg/dL (H)).  Liver Function Tests: Recent Labs  Lab 01/03/23 0900 01/04/23 0840 01/06/23 0856 01/09/23 0805  ALBUMIN 2.8* 2.8* 2.8* 3.1*    No results for input(s): "LIPASE", "AMYLASE" in the last 168 hours. No results  for input(s): "AMMONIA" in the last 168 hours.  CBC: Recent Labs  Lab 01/03/23 0900 01/04/23 0840 01/06/23 0856 01/09/23 0800  WBC 6.1 6.5 6.0 7.3  HGB 10.1* 10.3* 10.4* 10.7*  HCT 34.2* 34.2* 35.5* 35.2*  MCV 87.0 86.6 88.5 86.7  PLT 219 210 198 235      HbA1C: Hemoglobin A1C  Date/Time Value Ref Range Status  06/15/2014 12:58 AM 7.9 (H) 4.2 - 6.3 % Final    Comment:    The American Diabetes Association recommends that a primary goal of therapy should be <7% and that physicians should reevaluate the treatment regimen in patients with HbA1c values consistently >8%.   12/24/2013 11:18 AM 8.2 (H) 4.2 - 6.3 % Final    Comment:    The American Diabetes Association recommends that a primary goal of therapy should be <7% and that physicians should reevaluate the treatment regimen in patients with HbA1c values consistently >8%.    Hgb A1c MFr Bld  Date/Time Value Ref Range Status  10/13/2022 11:46 AM 5.2 4.8 - 5.6 % Final    Comment:    (NOTE)         Prediabetes: 5.7 - 6.4         Diabetes: >6.4         Glycemic control for adults with diabetes: <7.0   03/23/2022 06:47 AM 5.8 (H) 4.8 - 5.6 % Final  Comment:    (NOTE) Pre diabetes:          5.7%-6.4%  Diabetes:              >6.4%  Glycemic control for   <7.0% adults with diabetes     Urinalysis: No results for input(s): "COLORURINE", "LABSPEC", "PHURINE", "GLUCOSEU", "HGBUR", "BILIRUBINUR", "KETONESUR", "PROTEINUR", "UROBILINOGEN", "NITRITE", "LEUKOCYTESUR" in the last 72 hours.  Invalid input(s): "APPERANCEUR"    Imaging: No results found.   Medications:    sodium chloride Stopped (11/16/22 1155)   anticoagulant sodium citrate      (feeding supplement) PROSource Plus  30 mL Oral TID BM   Chlorhexidine Gluconate Cloth  6 each Topical Q0600   cinacalcet  60 mg Oral Q supper   epoetin (EPOGEN/PROCRIT) injection  4,000 Units Intravenous Q M,W,F-HD   feeding supplement (NEPRO CARB STEADY)  237 mL Oral  TID BM   fentaNYL  1 patch Transdermal Q72H   gabapentin  100 mg Oral Once per day on Mon Wed Fri   heparin injection (subcutaneous)  5,000 Units Subcutaneous Q8H   levETIRAcetam  1,000 mg Oral Daily   levETIRAcetam  250 mg Oral Q M,W,F   lidocaine  1 patch Transdermal Q24H   pantoprazole  40 mg Oral BID   QUEtiapine  25 mg Oral QHS   sertraline  25 mg Oral Daily   sodium chloride flush  10-40 mL Intracatheter Q12H   umeclidinium-vilanterol  1 puff Inhalation Daily    Assessment/ Plan:     [name] is a 72 y.o. female with end stage renal disease on hemodialysis, CAD, COPD with intermittent home O2, hypertension, hyperlipidemia, stroke, diabetes who has been admitted since 3/20. Initially admitted for vertebral osteomyelitis. She has completed IV antibiotics. Now is working on placement.   Due to extended hospitalization, patient will need outpatient dialysis clinic re-initiated.   [facility] permcath   #1 ESRD:  -Received dialysis earlier today, UF 0.5 L achieved. - Next treatment scheduled for Wednesday - Scheduled home visit today to evaluate home hemodialysis capability.   #2: Anemia with chronic kidney disease: hemoglobin 10.7.  Acceptable for this patient.  #3: Second hyperparathyroidism: With hypercalcemia Lab Results  Component Value Date   PTH 157 (H) 12/08/2022   CALCIUM 9.6 01/09/2023   CAION 1.09 (L) 08/28/2022   PHOS 3.4 01/09/2023   - Zolmeta 4mg  IV on 12/22/2022 -Calcium currently 9.6. -Continue Cinacalcet to 60 mg daily. -Hypophosphatemia, resolved.  Will reinitiate binders when needed.   #4: Hypertension  Not currently on any blood pressure agents.  Blood pressure soft during dialysis.  No antihypertensives ordered.    LOS: [address] [facility] 6/17/20241:12 PM

## 2023-01-09 NOTE — Progress Notes (Signed)
Hemodialysis Note  Received patient in bed to unit. Alert and oriented. Informed consent signed and in chart.   Treatment initiated:0801 Treatment completed:1138  Patient tolerated treatment well. Transported back to the room alert, without acute distress. Report given to patient's RN.  Access used: LIJ Catheter  Access issues: None   Total UF removed:1334 ml/ 1.3 liters  Medications given: Epogen, Heparin  Post HD VS: Stable  Post HD weight: 82.3 kg Bed scale   Bartolo Darter, RN [facility]

## 2023-01-09 NOTE — Progress Notes (Signed)
Progress Note   [name] ZOX:[id number] DOB: 11-10-1950 DOA: 10/12/2022     89 DOS: the patient was seen and examined on 01/09/2023   Brief hospital course: Cynthia Dean is a 72 y.o. 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 She presents to the ED with worsening lower extremity pain. Admitted for possible Discitis/osteomyelitis at L4-L5: w/ suspected intradiscal abscess as per MRI. 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   4/18, HD access / fistula clotted off during dialysis.  4/19 vascular placed R femoral temp HD cath placed, resumed HD and pemcath placed 4/24.  5/14- back pain seems to be well-controlled.  Caseworkers have discussed the case, involved ethics committee/Hospital administration. Looking into home dialysis.  Lacks capacity to make informed decision.   6/17: New expressive aphasia since yesterday per nursing, MRI brain  Assessment and Plan:  Nausea and vomiting Prolonged QT interval. Patient still has significant nausea vomiting occasionally, she has been having daily bowel movements.  No diarrhea.  No abdominal pain.  avoid the Zofran.  Patient seem to be responding to Reglan better.  She probably has gastroparesis.  Continue Reglan 10 mg IV daily as needed.   Right knee pain due to osteoarthritis. Plain right knee pain, x-ray showed mild effusion with osteoarthritis. 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  Completed Keflex Wound cx grew staph capitis.   ID seen. Not a good surgical candidate as per neuro surg.   Pt having severe low back and bilateral  leg pain and has not been able to tolerate any position except laying nearly flat in the bed S/p cefazolin, for 6 weeks, completed: 12/02/22 Continue symptomatic treatment with pain medicine (Duragesic patch, Fentanyl IV prn, lidocaine prn while at HD and as needed acetaminophen).    Confusion and agitation - improved Hospital delirium  Improved.  Now she is having new onset expressive aphasia per nursing since yesterday.  Will get MRI of the brain to rule out any acute pathology   ESRD: on HD MWF.  Anemia of chronic kidney disease. Hypokalemia Hypophosphatemia. Discussed with nephrology, patient will be continued on dialysis, will manage with potassium phosphate over dialysis. Phosphate binder discontinued. Discussed with TOC, trying to set up home HD, may take until July.  Patient will be in the hospital until then.   Generalized weakness / Physical Debility:  Not able to place.   Severe back and bilateral leg pain due to severe multi-level lumbar degenerative disc disease. Peripheral neuropathy. Right foot pain. Pt has bulging discs at L2-3, L3-4, L4-5 and L5-S1. Pt will continue to have back and leg pain once antibiotics are complete. Continue pain medicine as above.   COPD: w/o exacerbation.  Continue on bronchodilators    DM2: well controlled HbA1c 5.2.    Patient condition stable, no change in treatment plan.     Subjective: Patient nonverbal, hypotensive, seen at dialysis  Physical Exam: Vitals:   01/09/23 1000 01/09/23 1021 01/09/23 1030 01/09/23 1100  BP: 94/62 (!) 89/60 104/63 (!) 80/62  Pulse: (!) 106 (!) 106 (!) 101 (!) 102  Resp: 17 20 18  (!) 23  Temp:  TempSrc:      SpO2: 99% 98% 97% 100%  Weight:      Height:       General exam: Appears calm and comfortable  Respiratory system: Clear to auscultation. Respiratory effort normal. Cardiovascular system: S1 & S2 heard, RRR. No JVD, murmurs, rubs, gallops or clicks. No pedal edema. Gastrointestinal  system: Abdomen is nondistended, soft and nontender. No organomegaly or masses felt. Normal bowel sounds heard. Central nervous system: Alert and oriented x1. No focal neurological deficits. Extremities: Symmetric 5 x 5 power. Skin: No rashes, lesions or ulcers Psychiatry: Mood & affect appropriate.  Data Reviewed:  There are no new results to review at this time.  Family Communication: None at bedside  Disposition: Status is: Inpatient Remains inpatient appropriate because: Unsafe discharge.  Getting MRI of the brain today to rule out any acute pathology  Planned Discharge Destination: Skilled nursing facility versus home with home health   DVT prophylaxis-heparin Time spent: 25 minutes  Author: Delfino Lovett, MD 01/09/2023 11:42 AM  For on call review [email or web address].

## 2023-01-10 ENCOUNTER — Inpatient Hospital Stay (HOSPITAL_COMMUNITY)
Admit: 2023-01-10 | Discharge: 2023-01-10 | Disposition: A | Discharge status: Home / Self Care | Payer: 59 | Attending: Internal Medicine | Admitting: Internal Medicine

## 2023-01-10 ENCOUNTER — Inpatient Hospital Stay: Payer: 59

## 2023-01-10 DIAGNOSIS — I6389 Other cerebral infarction: Secondary | ICD-10-CM

## 2023-01-10 DIAGNOSIS — I63512 Cerebral infarction due to unspecified occlusion or stenosis of left middle cerebral artery: Secondary | ICD-10-CM

## 2023-01-10 DIAGNOSIS — I639 Cerebral infarction, unspecified: Secondary | ICD-10-CM

## 2023-01-10 DIAGNOSIS — Z7189 Other specified counseling: Secondary | ICD-10-CM

## 2023-01-10 DIAGNOSIS — R4701 Aphasia: Secondary | ICD-10-CM

## 2023-01-10 LAB — ECHOCARDIOGRAM COMPLETE
AR max vel: 3.28 cm2
AV Area VTI: 3.39 cm2
AV Area mean vel: 2.94 cm2
AV Mean grad: 3 mmHg
AV Peak grad: 5.2 mmHg
Ao pk vel: 1.14 m/s
Area-P 1/2: 7.9 cm2
Height: 72 in
MV VTI: 3.8 cm2
S' Lateral: 3.1 cm
Weight: 2903.02 oz

## 2023-01-10 LAB — LIPID PANEL
Cholesterol: 167 mg/dL (ref 0–200)
HDL: 54 mg/dL (ref >40)
LDL Cholesterol: 88 mg/dL (ref 0–99)
Total CHOL/HDL Ratio: 3.1 RATIO
Triglycerides: 123 mg/dL (ref <150)
VLDL: 25 mg/dL (ref 0–40)

## 2023-01-10 LAB — PROTIME-INR
INR: 1.1 (ref 0.8–1.2)
Prothrombin Time: 14 seconds (ref 11.4–15.2)

## 2023-01-10 LAB — APTT: aPTT: 36 seconds (ref 24–36)

## 2023-01-10 LAB — HEMOGLOBIN A1C
Hgb A1c MFr Bld: 5.1 % (ref 4.8–5.6)
Mean Plasma Glucose: 99.67 mg/dL

## 2023-01-10 MED ORDER — STROKE: EARLY STAGES OF RECOVERY BOOK: Freq: Once | Status: AC

## 2023-01-10 MED ORDER — ATORVASTATIN CALCIUM 20 MG PO TABS
40.0000 mg | ORAL_TABLET | Freq: Every day | ORAL | Status: DC
  Administered 2023-01-12: 40 mg via ORAL
  Filled 2023-01-10 (×2): qty 2

## 2023-01-10 MED ORDER — ASPIRIN 325 MG PO TBEC
325.0000 mg | DELAYED_RELEASE_TABLET | Freq: Every day | ORAL | Status: DC
  Administered 2023-01-12: 325 mg via ORAL
  Filled 2023-01-10 (×2): qty 1

## 2023-01-10 NOTE — Progress Notes (Signed)
Progress Note   [name] ZOX:[id number] DOB: 07/06/1951 DOA: 10/12/2022     90 DOS: the patient was seen and examined on 01/10/2023   Brief hospital course: [name] is a 72 y.o. 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 She presents to the ED with worsening lower extremity pain. Admitted for possible Discitis/osteomyelitis at L4-L5: w/ suspected intradiscal abscess as per MRI. 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   4/18, HD access / fistula clotted off during dialysis.  4/19 vascular placed R femoral temp HD cath placed, resumed HD and pemcath placed 4/24.  5/14- back pain seems to be well-controlled.  Caseworkers have discussed the case, involved ethics committee/Hospital administration. Looking into home dialysis.  Lacks capacity to make informed decision.   6/17: New expressive aphasia since yesterday per nursing, MRI brain  6/18: New stroke confirmed on MRI, neuro consult, stroke workup, palliative care reeval, stop fentanyl  Assessment and Plan:  Nausea and vomiting Prolonged QT interval. Now resolved.  Acute stroke Due to patient's new onset aphasia and lethargy MRI of the brain was obtained last night which confirmed Acute cortical ischemia within the posterior left MCA territory.  Will consult neurology obtain 2D echo.  Start aspirin, statin, PT, OT, speech therapy evaluation. I have stopped phentermine patch if that is contributing to her lethargy discontinue fentanyl patch overall poor prognosis Right knee pain due to osteoarthritis. Plain right knee pain, x-ray showed mild effusion with osteoarthritis.  With her lethargy will hold off fentanyl for now   Lumbar discitis, L4-L5 epidural phlegmon and b/l psoas abscess on MRI S/p IR aspiration of psoas  abscess on 10/13/22 Low back pain, Bilateral Leg Pain - due to above Vanco and cefepime on 10/13/22 after procedure.  Completed Keflex Wound cx grew staph capitis.   ID seen. Not a good surgical candidate as per neuro surg.   Pt having severe low back and bilateral leg pain and has not been able to tolerate any position except laying nearly flat in the bed S/p cefazolin, for 6 weeks, completed: 12/02/22 Continue symptomatic treatment with pain medicine (lidocaine prn while at HD and as needed acetaminophen).  Stopping fentanyl patch due to lethargy   Confusion and agitation - improved Hospital delirium  Likely multifactorial - delirium, acute stroke    ESRD: on HD MWF.  Anemia of chronic kidney disease. Hypokalemia Hypophosphatemia. Dialysis per nephrology team. [facility] and nephrology team is working/trying to set up home HD, equipments are scheduled to get delivered this Thursday.   Generalized weakness / Physical Debility:  PT, OT eval   Severe back and bilateral leg pain due to severe multi-level lumbar degenerative disc disease. Peripheral neuropathy. Right foot pain. Pt has bulging discs at L2-3, L3-4, L4-5 and L5-S1. Pt will continue to have back and leg pain once antibiotics are complete. Continue pain medicine as above.   COPD: w/o exacerbation.  Continue on bronchodilators    DM2: well controlled HbA1c 5.2.    Family is leaning towards comfort care and hospice now      Subjective: Seems disoriented.  Speaks few words which does not make sense  Physical Exam: Vitals:   01/09/23 2043 01/10/23 0410 01/10/23 0824 01/10/23 1155  BP: 128/75 107/74 134/81 97/60  Pulse:  98 (!) 108 100  Resp: 18 18 18 17   Temp: 98.4 F (36.9 C) 98  F (36.7 C) 97.9 F (36.6 C) (!) 97.3 F (36.3 C)  TempSrc:  Oral    SpO2: 96% 96% 97% 95%  Weight:      Height:       General exam: Appears calm and comfortable  Respiratory system: Clear to auscultation. Respiratory effort  normal. Cardiovascular system: Regular rate and rhythm Gastrointestinal system: Soft, benign Central nervous system: Disoriented, alert and awake, nonfocal, she does not follow verbal commands Extremities: Symmetric 5 x 5 power. Skin: No rashes, lesions or ulcers  Data Reviewed:  MRI of the brain shows Acute cortical ischemia within the posterior left MCA territory  Family Communication: Updated daughter [name] over phone  Disposition: Status is: Inpatient Remains inpatient appropriate because: Acute stroke, mental status, hospice evaluation  Planned Discharge Destination:  Home with hospice?   DVT prophylaxis-heparin Time spent: 35 minutes  Author: Delfino Lovett, MD 01/10/2023 2:09 PM  For on call review [email or web address].

## 2023-01-10 NOTE — Discharge Planning (Signed)
Informed that family is considering hospice at this time. Spoke with [facility] RN and she stated that Selena Batten called and canceled delivery for home hemo machine and supplies.

## 2023-01-10 NOTE — Progress Notes (Signed)
Nutrition Brief Note  Chart reviewed. Pt now transitioning to comfort care.  No further nutrition interventions planned at this time.  Please re-consult as needed.   William Laske W, RD, LDN, CDCES Registered Dietitian II Certified Diabetes Care and Education Specialist Please refer to [facility] for RD and/or RD on-call/weekend/after hours pager   

## 2023-01-10 NOTE — TOC Progression Note (Signed)
Transition of Care [facility]) - Progression Note    Patient Details  Name: Cynthia Dean MRN: [id number] Date of Birth: 1951-01-30  [facility] CM/SW Contact  [name], [place] Phone Number: 01/10/2023, 2:57 PM  Clinical Narrative:     CSW was informed that patient's family is interested in hospice and comfort care now.  CSW spoke to patient's daughter Selena Batten, she confirmed that is the plan.  CSW provided choice for hospice agencies, and she would like [facility].  Referral made to [facility].       Expected Discharge Plan and Services                                               Social Determinants of Health (SDOH) Interventions SDOH Screenings   Food Insecurity: No Food Insecurity (10/12/2022)  Housing: Low Risk  (10/12/2022)  Transportation Needs: No Transportation Needs (10/12/2022)  Utilities: Not At Risk (10/12/2022)  Financial Resource Strain: Low Risk  (11/12/2017)  Physical Activity: Insufficiently Active (11/12/2017)  Social Connections: Moderately Integrated (11/12/2017)  Stress: No Stress Concern Present (11/12/2017)  Tobacco Use: Medium Risk (11/16/2022)    Readmission Risk Interventions     No data to display

## 2023-01-10 NOTE — Plan of Care (Addendum)
PMT has been shadowing chart as patient stopped talking to PMT, and daughter [name] was making decisions directly with primary team. Goals were set for continuing dialysis. MOST form had been updated/completed for DNR, no intubation, no feeding tube.   Epic chat received regarding patient having a new CVA and not wanting to work with therapy. Spoke with neurology who plans further evaluation and reaching out to daughter. As per conversation with neurology, PMT will wait for neurology discussions and updates. Primary team made aware.

## 2023-01-10 NOTE — Evaluation (Signed)
Occupational Therapy Evaluation Patient Details Name: Cynthia Dean MRN: [id number] DOB: [birth year] Today's Date: 01/10/2023   History of Present Illness Cynthia Dean is a 72 y.o. 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 She presents to the ED with worsening lower extremity pain. 01/09/23 Brain MRI: Acute cortical ischemia within the posterior left MCA territory.   Clinical Impression   Pt was seen for OT/PT re-evaluation following above new findings. Per chart and RN, pt has been TOTAL care. Upon arrival pt reclined in bed. Pt does not follow commands, answer any orientation questions, or respond to name. TOTAL A x2 sup<>sit, pt agitated with mobility attempts; repeatedly yelling "no" "leave me alone" "stop it." Moving all extremities in bed however does not follow commands for formal testing. Pt appears at baseline, will require TOTAL A and 24/7 asssitance. No skilled acute OT needs identified, will sign off. Upon hospital discharge, recommend no OT follow up.   Recommendations for follow up therapy are one component of a multi-disciplinary discharge planning process, led by the attending physician.  Recommendations may be updated based on patient status, additional functional criteria and insurance authorization.   Assistance Recommended at Discharge Frequent or constant Supervision/Assistance  Patient can return home with the following Two people to help with walking and/or transfers;Help with stairs or ramp for entrance;Assistance with cooking/housework;Assist for transportation;Direct supervision/assist for financial management;Direct supervision/assist for medications management;Two people to help with bathing/dressing/bathroom    Functional Status Assessment  Patient has had a recent decline in their functional status and/or demonstrates  limited ability to make significant improvements in function in a reasonable and predictable amount of time  Equipment Recommendations  Hospital bed    Recommendations for Other Services       Precautions / Restrictions Precautions Precautions: Fall Restrictions Weight Bearing Restrictions: No      Mobility Bed Mobility Overal bed mobility: Needs Assistance Bed Mobility: Supine to Sit, Sit to Supine     Supine to sit: Total assist, +2 for physical assistance Sit to supine: Total assist, +2 for physical assistance        Transfers                          Balance Overall balance assessment: Needs assistance Sitting-balance support: Bilateral upper extremity supported, Feet supported Sitting balance-Leahy Scale: Poor                                     ADL either performed or assessed with clinical judgement   ADL Overall ADL's : At baseline                                       General ADL Comments: per chart and RN pt has been TOTAL care      Pertinent Vitals/Pain Pain Assessment Pain Assessment: Faces Faces Pain Scale: Hurts little more Pain Location: does not state Pain Descriptors / Indicators: Guarding, Grimacing, Moaning Pain Intervention(s): Limited activity within patient's tolerance, Repositioned     Hand Dominance     Extremity/Trunk Assessment Upper Extremity Assessment Upper Extremity Assessment: Generalized weakness   Lower Extremity Assessment Lower Extremity Assessment: Generalized weakness       Communication  Cognition Arousal/Alertness: Awake/alert Behavior During Therapy: Flat affect Overall Cognitive Status: No family/caregiver present to determine baseline cognitive functioning                                 General Comments: appears at baseline compared to recent documentation. Pt does not follow commands, answer any orientation questions, respond to name, or engage  with therapist. Pt repeatedly yelling "no" "leave me alone" "stop it"      Home Living Family/patient expects to be discharged to:: Unsure                                                OT Problem List: Decreased cognition         OT Goals(Current goals can be found in the care plan section) Acute Rehab OT Goals Patient Stated Goal: to be left alone OT Goal Formulation: With patient Time For Goal Achievement: 01/10/23 Potential to Achieve Goals: Poor      Co-evaluation PT/OT/SLP Co-Evaluation/Treatment: Yes Reason for Co-Treatment: Complexity of the patient's impairments (multi-system involvement);Necessary to address cognition/behavior during functional activity;For patient/therapist safety;To address functional/ADL transfers PT goals addressed during session: Mobility/safety with mobility OT goals addressed during session: ADL's and self-care      AM-PAC OT "6 Clicks" Daily Activity     Outcome Measure Help from another person eating meals?: A Lot Help from another person taking care of personal grooming?: A Lot Help from another person toileting, which includes using toliet, bedpan, or urinal?: Total Help from another person bathing (including washing, rinsing, drying)?: Total Help from another person to put on and taking off regular upper body clothing?: Total Help from another person to put on and taking off regular lower body clothing?: Total 6 Click Score: 8   End of Session    Activity Tolerance: Treatment limited secondary to agitation Patient left: in bed;with call bell/phone within reach;with bed alarm set  OT Visit Diagnosis: Unsteadiness on feet (R26.81)                Time: 1010-1018 OT Time Calculation (min): 8 min Charges:  OT General Charges $OT Visit: 1 Visit OT Evaluation $OT Re-eval: 1 Re-eval  Kathie Dike, M.S. OTR/L  01/10/23, 10:32 AM  ascom [phone]

## 2023-01-10 NOTE — Progress Notes (Signed)
Nutrition Follow-up  DOCUMENTATION CODES:   Non-severe (moderate) malnutrition in context of chronic illness  INTERVENTION:   -Continue renal MVI daily -Continue 30 ml Prosource Plus to TID, each supplement provides 100 kcals and 15 grams protein -Continue Nepro Shake po TID, each supplement provides 425 kcal and 19 grams protein  -Continue liberalized diet of regular  -Continue feeding assistance with meals  NUTRITION DIAGNOSIS:   Moderate Malnutrition related to chronic illness (ESRD on HD) as evidenced by percent weight loss, mild muscle depletion, moderate muscle depletion, edema.  Ongoing  GOAL:   Patient will meet greater than or equal to 90% of their needs  Progressing   MONITOR:   PO intake, Supplement acceptance  REASON FOR ASSESSMENT:   Malnutrition Screening Tool    ASSESSMENT:   Pt with PMH of ESRD on HD, HLD, COPD, DM2, HTN, depression, CVA who presents with back pain x 3 weeks PTA.  Reviewed I/O's: -820 ml x 24 hours and -1.1 L since 12/27/22  Per psych eval pt unable to make an informed decision regarding decision to stop HD treatment or refuse HD treatment. Per TOC notes, plan for home HD training. Pt daughter to undergo training.   Pt having expressive aphasia as of 01/08/23. Plan MRI of brain today.   Pt with improved oral intake. Noted meal completions 50-100%. Pt continues to have variable acceptance with supplements.   Reviewed wt hx; pt has experienced a 6.2% wt loss over the past month, which is significant for time frame. Suspect some wt fluctuations are related fluid changes from HD. Pt has been identified with moderate malnutrition as a result of prolonged hospitalization, erratic oral intake, and periods of HD refusal.   Medications reviewed and include sensipar, epogen, neurontin, keppra, and protonix.   Labs reviewed: CBGS: 127.    Diet Order:   Diet Order             Diet NPO time specified  Diet effective now                    EDUCATION NEEDS:   No education needs have been identified at this time  Skin:  Skin Assessment: Skin Integrity Issues: Skin Integrity Issues:: Other (Comment) Other: IAD to lt buttocks  Last BM:  01/09/23 (type 5)  Height:   Ht Readings from Last 1 Encounters:  10/12/22 6' (1.829 m)    Weight:   Wt Readings from Last 1 Encounters:  01/09/23 82.3 kg    Ideal Body Weight:  72.7 kg  BMI:  Body mass index is 24.61 kg/m.  Estimated Nutritional Needs:   Kcal:  2150-2350  Protein:  105-120 grams  Fluid:  1000 ml + UOP    Levada Schilling, RD, LDN, CDCES Registered Dietitian II Certified Diabetes Care and Education Specialist Please refer to [facility] for RD and/or RD on-call/weekend/after hours pager

## 2023-01-10 NOTE — Plan of Care (Signed)
  Problem: Education: Goal: Knowledge of disease or condition will improve Outcome: Not Progressing   Problem: Ischemic Stroke/TIA Tissue Perfusion: Goal: Complications of ischemic stroke/TIA will be minimized Outcome: Not Progressing   Problem: Coping: Goal: Will verbalize positive feelings about self Outcome: Not Progressing Goal: Will identify appropriate support needs Outcome: Not Progressing

## 2023-01-10 NOTE — Progress Notes (Signed)
*  PRELIMINARY RESULTS* Echocardiogram 2D Echocardiogram has been performed.  Cynthia Dean 01/10/2023, 3:22 PM

## 2023-01-10 NOTE — Evaluation (Signed)
Physical Therapy Re-Evaluation Patient Details Name: Cynthia Dean MRN: [id number] DOB: 06/28/1951 Today's Date: 01/10/2023  History of Present Illness  Cynthia Dean is a 72 y.o. 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 She presents to the ED with worsening lower extremity pain. 01/09/23 Brain MRI: Acute cortical ischemia within the posterior left MCA territory.   Clinical Impression  PT/OT orders placed due to new findings on MRI, co re-evaluation to maximize pt/therapist safety and function. Pt initially alert, and cognition appears at baseline compared to recent documentation. Pt does not follow commands, answer any orientation questions, respond to name, or engage with therapist. Pt repeatedly yelling "no" "leave me alone" "stop it" with all mobility attempts. totalAx2 to sit EOB and to reposition in supine, and eyes closed at end of session refusing to speak to therapist. Anticipate pt is at new baseline level of functioning (total care with 24/7 supervision/assistance necessary) PT to sign off.       Recommendations for follow up therapy are one component of a multi-disciplinary discharge planning process, led by the attending physician.  Recommendations may be updated based on patient status, additional functional criteria and insurance authorization.  Follow Up Recommendations Can patient physically be transported by private vehicle: No     Assistance Recommended at Discharge Frequent or constant Supervision/Assistance  Patient can return home with the following  Two people to help with walking and/or transfers;Assistance with cooking/housework;Direct supervision/assist for medications management;Direct supervision/assist for financial management;Assist for transportation;Help with stairs or ramp for entrance;Two people to help with  bathing/dressing/bathroom;Assistance with feeding    Equipment Recommendations None recommended by PT  Recommendations for Other Services       Functional Status Assessment       Precautions / Restrictions Precautions Precautions: Fall Restrictions Weight Bearing Restrictions: No      Mobility  Bed Mobility Overal bed mobility: Needs Assistance Bed Mobility: Supine to Sit, Sit to Supine     Supine to sit: Total assist, +2 for physical assistance Sit to supine: Total assist, +2 for physical assistance   General bed mobility comments: TOTAL A +2 for attempt but unable to fully get upright seated EOB; pt very fearful and resistant to movement    Transfers                        Ambulation/Gait                  Stairs            Wheelchair Mobility    Modified Rankin (Stroke Patients Only)       Balance Overall balance assessment: Needs assistance Sitting-balance support: Bilateral upper extremity supported, Feet supported Sitting balance-Leahy Scale: Poor                                       Pertinent Vitals/Pain Pain Assessment Pain Assessment: Faces Faces Pain Scale: Hurts little more Pain Location: does not state Pain Descriptors / Indicators: Guarding, Grimacing, Moaning Pain Intervention(s): Limited activity within patient's tolerance, Monitored during session, Repositioned    Home Living Family/patient expects to be discharged to:: Unsure                   Additional Comments: per chart pt was previously living with family,  assist for ADLs and pt regressed to stand pivot transfers. has had an extended hospital stay    Prior Function                       Hand Dominance        Extremity/Trunk Assessment   Upper Extremity Assessment Upper Extremity Assessment: Generalized weakness    Lower Extremity Assessment Lower Extremity Assessment: Generalized weakness       Communication       Cognition Arousal/Alertness: Awake/alert Behavior During Therapy: Flat affect Overall Cognitive Status: No family/caregiver present to determine baseline cognitive functioning                                 General Comments: appears at baseline compared to recent documentation. Pt does not follow commands, answer any orientation questions, respond to name, or engage with therapist. Pt repeatedly yelling "no" "leave me alone" "stop it"        General Comments      Exercises     Assessment/Plan    PT Assessment Patient does not need any further PT services  PT Problem List         PT Treatment Interventions      PT Goals (Current goals can be found in the Care Plan section)       Frequency       Co-evaluation PT/OT/SLP Co-Evaluation/Treatment: Yes Reason for Co-Treatment: Complexity of the patient's impairments (multi-system involvement);Necessary to address cognition/behavior during functional activity;For patient/therapist safety;To address functional/ADL transfers PT goals addressed during session: Mobility/safety with mobility OT goals addressed during session: ADL's and self-care       AM-PAC PT "6 Clicks" Mobility  Outcome Measure Help needed turning from your back to your side while in a flat bed without using bedrails?: Total Help needed moving from lying on your back to sitting on the side of a flat bed without using bedrails?: Total Help needed moving to and from a bed to a chair (including a wheelchair)?: Total Help needed standing up from a chair using your arms (e.g., wheelchair or bedside chair)?: Total Help needed to walk in hospital room?: Total Help needed climbing 3-5 steps with a railing? : Total 6 Click Score: 6    End of Session   Activity Tolerance: Treatment limited secondary to agitation;Other (comment) (cognition) Patient left: in bed;with call bell/phone within reach;with bed alarm set Nurse Communication: Mobility  status PT Visit Diagnosis: Unsteadiness on feet (R26.81);Other abnormalities of gait and mobility (R26.89);Muscle weakness (generalized) (M62.81);Difficulty in walking, not elsewhere classified (R26.2)    Time: 1010-1018 PT Time Calculation (min) (ACUTE ONLY): 8 min   Charges:   PT Evaluation $PT Re-evaluation: 1 Re-eval         .Olga Coaster PT, DPT 11:15 AM,01/10/23

## 2023-01-10 NOTE — IPAL (Signed)
  Interdisciplinary Goals of Care Family Meeting   Date carried out: 01/10/2023  Location of the meeting: Phone conference  Member's involved: Physician and Family Member or next of kin  Durable Power of Attorney or Environmental health practitioner: Kimberly/daughter  Discussion: We discussed goals of care for [employer].  Daughter is planning to have discussion with other family numbers later today and hoping to have decision by tomorrow after discussion with palliative care.  She is leaning towards keeping her comfortable and possibly considering hospice.  She will also decide on stopping dialysis if they choose hospice.  Dialysis equipments supposed to get delivered at home this Thursday so that needs to be coordinated if she chooses hospice.  Code status:   Code Status: DNR   Disposition: Home with Hospice?  Time spent for the meeting: 35 minutes    Delfino Lovett, MD  01/10/2023, 1:58 PM

## 2023-01-10 NOTE — Progress Notes (Addendum)
[facility] [employer], contracting [facility]) Hospital Liaison Note   Received request from [facility], [place], [place],   to meet with family at bedside to explain hospice services at home, LTC or IPU. HL met with family at bedside and provided extensive education for hospice services.  Family requested additional time to consider options before proceeding. All questions answered and no concerns voiced.     [facility] information and contact numbers given to family & above information shared with TOC.   Please call with any questions/concerns.    Thank you for the opportunity to participate in this patient's care.  [facility] Liaison [phone]

## 2023-01-10 NOTE — Consult Note (Incomplete)
NEUROLOGY CONSULTATION NOTE   Date of service: January 10, 2023 Patient Name: Cynthia Dean MRN:  [id number] DOB:  1950/08/19 Reason for consult: acute ischemic stroke Requesting physician: Dr. Delfino Lovett _ _ _   _ __   _ __ _ _  __ __   _ __   __ _  History of Present Illness   This is a 72 yo woman with hx CAD, COPD with intermittent home O2, HTN, HL, prior stroke, DM2, and ESRD on HD. Neurology is consulted for acute ischemic stroke.  She was initially admitted 10/12/22 with worsening leg pain and was found to have intradiscal abscess, lumbar discitis, and bilateral abscesses of the psoas muscles now s/p IR guided drainage. She has had a protracted hospitalization since that time including involvement of the ethics committee and hospital administration because she was felt not to have capacity to make her own decisions including with respect to dialysis. On 6/17 RN reported that patient had a new expressive aphasia and was more lethargic than usual. MRI brain was performed which showed an acute infarct in the posterior L MCA territory (personal review). She also has e/o multiple remote infarcts.   Patient is unable to provide history to me and is very difficult to arouse during my examination. No family is at bedside to provide collateral; I will reach out to them.    ROS   UTA 2/2 mental status  Past History   I have reviewed the following:  Past Medical History:  Diagnosis Date   Acute on chronic respiratory failure with hypoxia (HCC) 07/30/2019   Acute respiratory failure with hypoxia (HCC) 12/31/2018   Carotid arterial disease (HCC)    a. 02/2018 < bilat ICA stenosis.   COPD (chronic obstructive pulmonary disease) (HCC)    Coronary artery disease    a. 11/2015 MV: EF 57%, no ischemia/infarct.   Cryptogenic stroke (HCC)    a. 10/2017 s/p implantable loop recorder (no Afib to date).   Diabetes mellitus without complication (HCC)    Diarrhea    ESRD on dialysis [facility])    GI  bleed    a. 01/2019 EGD/Colonoscopy: Mild gastritis. Colitis.   Heart murmur    a. 12/2018 Echo: EF 55-60%, impaired relaxation. Mod dil LA. Mildly dil RA. Mild Ca2+ of AoV.   Hematemesis 06/27/2017   History of 2019 novel coronavirus disease (COVID-19) 07/30/2019   Hyperkalemia    Hyperlipidemia    Hypertension    Intractable nausea and vomiting 08/08/2015   Nausea & vomiting 05/30/2018   Nausea and vomiting 05/29/2018   Pneumonia due to COVID-19 virus    Past Surgical History:  Procedure Laterality Date   A/V FISTULAGRAM Left 12/20/2016   Procedure: A/V Fistulagram;  Surgeon: Renford Dills, MD;  Location: [facility];  Service: Cardiovascular;  Laterality: Left;   A/V FISTULAGRAM Left 09/22/2022   Procedure: A/V Fistulagram;  Surgeon: Annice Needy, MD;  Location: [facility];  Service: Cardiovascular;  Laterality: Left;   A/V SHUNT INTERVENTION N/A 12/20/2016   Procedure: A/V Shunt Intervention;  Surgeon: Renford Dills, MD;  Location: [facility];  Service: Cardiovascular;  Laterality: N/A;   A/V SHUNTOGRAM Left 09/11/2017   Procedure: A/V SHUNTOGRAM;  Surgeon: Annice Needy, MD;  Location: [facility];  Service: Cardiovascular;  Laterality: Left;   A/V SHUNTOGRAM Left 11/21/2019   Procedure: A/V SHUNTOGRAM;  Surgeon: Annice Needy, MD;  Location: [facility];  Service: Cardiovascular;  Laterality: Left;   BREAST BIOPSY Bilateral 07/19/2000   neg   BREAST BIOPSY Left 11/03/2021   [place] bx mass at 3:00, venus marker, axilla bx-hyrdo marker, path pending   COLONOSCOPY WITH PROPOFOL N/A 02/16/2019   Procedure: COLONOSCOPY WITH PROPOFOL;  Surgeon: Toledo, Boykin Nearing, MD;  Location: [facility];  Service: Gastroenterology;  Laterality: N/A;   COLONOSCOPY WITH PROPOFOL N/A 08/31/2022   Procedure: COLONOSCOPY WITH PROPOFOL;  Surgeon: Jaynie Collins, DO;  Location: [facility] ENDOSCOPY;  Service: Gastroenterology;  Laterality: N/A;    DIALYSIS/PERMA CATHETER INSERTION N/A 11/16/2022   Procedure: DIALYSIS/PERMA CATHETER INSERTION;  Surgeon: Annice Needy, MD;  Location: [facility];  Service: Cardiovascular;  Laterality: N/A;   ESOPHAGOGASTRODUODENOSCOPY (EGD) WITH PROPOFOL N/A 02/16/2019   Procedure: ESOPHAGOGASTRODUODENOSCOPY (EGD) WITH PROPOFOL;  Surgeon: Toledo, Boykin Nearing, MD;  Location: [facility];  Service: Gastroenterology;  Laterality: N/A;   ESOPHAGOGASTRODUODENOSCOPY (EGD) WITH PROPOFOL N/A 08/28/2022   Procedure: ESOPHAGOGASTRODUODENOSCOPY (EGD) WITH PROPOFOL;  Surgeon: Jaynie Collins, DO;  Location: [facility] ENDOSCOPY;  Service: Gastroenterology;  Laterality: N/A;   IR US GUIDE BX ASP/DRAIN  03/25/2022   LOOP RECORDER INSERTION N/A 11/16/2017   Procedure: LOOP RECORDER INSERTION;  Surgeon: Duke Salvia, MD;  Location: [facility];  Service: Cardiovascular;  Laterality: N/A;   PERIPHERAL VASCULAR CATHETERIZATION Left 02/02/2015   Procedure: A/V Shuntogram/Fistulagram;  Surgeon: Annice Needy, MD;  Location: [facility];  Service: Cardiovascular;  Laterality: Left;   PERIPHERAL VASCULAR CATHETERIZATION Left 02/02/2015   Procedure: A/V Shunt Intervention;  Surgeon: Annice Needy, MD;  Location: [facility];  Service: Cardiovascular;  Laterality: Left;   PERIPHERAL VASCULAR CATHETERIZATION Left 03/09/2015   Procedure: A/V Shuntogram/Fistulagram;  Surgeon: Annice Needy, MD;  Location: [facility];  Service: Cardiovascular;  Laterality: Left;   PERIPHERAL VASCULAR CATHETERIZATION N/A 03/09/2015   Procedure: A/V Shunt Intervention;  Surgeon: Annice Needy, MD;  Location: [facility];  Service: Cardiovascular;  Laterality: N/A;   PERIPHERAL VASCULAR CATHETERIZATION Left 04/17/2015   Procedure: Upper Extremity Angiography;  Surgeon: Annice Needy, MD;  Location: [facility];  Service: Cardiovascular;  Laterality: Left;   PERIPHERAL VASCULAR CATHETERIZATION  04/17/2015    Procedure: Upper Extremity Intervention;  Surgeon: Annice Needy, MD;  Location: [facility];  Service: Cardiovascular;;   PERIPHERAL VASCULAR CATHETERIZATION N/A 08/10/2015   Procedure: A/V Shuntogram/Fistulagram;  Surgeon: Annice Needy, MD;  Location: [facility];  Service: Cardiovascular;  Laterality: N/A;   PERIPHERAL VASCULAR CATHETERIZATION N/A 08/10/2015   Procedure: A/V Shunt Intervention;  Surgeon: Annice Needy, MD;  Location: [facility];  Service: Cardiovascular;  Laterality: N/A;   PERIPHERAL VASCULAR CATHETERIZATION Left 04/11/2016   Procedure: A/V Shuntogram/Fistulagram;  Surgeon: Annice Needy, MD;  Location: [facility];  Service: Cardiovascular;  Laterality: Left;   TEE WITHOUT CARDIOVERSION N/A 11/16/2017   Procedure: TRANSESOPHAGEAL ECHOCARDIOGRAM (TEE);  Surgeon: Antonieta Iba, MD;  Location: [facility];  Service: Cardiovascular;  Laterality: N/A;   TEMPORARY DIALYSIS CATHETER N/A 11/11/2022   Procedure: TEMPORARY DIALYSIS CATHETER;  Surgeon: Annice Needy, MD;  Location: [facility];  Service: Cardiovascular;  Laterality: N/A;   Family History  Problem Relation Age of Onset   Breast cancer Father 47   Stroke Mother    Heart attack Mother    Heart Problems Sister    Breast cancer Cousin 70  1 st cousin. Maternal    Social History   Socioeconomic History   Marital status: Single    Spouse name: Not on file   Number of children: 5   Years of education: Not on file   Highest education level: Not on file  Occupational History   Occupation: Disability   Tobacco Use   Smoking status: Former    Packs/day: 0    Types: Cigarettes   Smokeless tobacco: Never  Vaping Use   Vaping Use: Never used  Substance and Sexual Activity   Alcohol use: No   Drug use: No   Sexual activity: Not Currently  Other Topics Concern   Not on file  Social History Narrative   Live with Fiance   Social Determinants of Health   Financial  Resource Strain: Low Risk  (11/12/2017)   Overall Financial Resource Strain (CARDIA)    Difficulty of Paying Living Expenses: Not hard at all  Food Insecurity: No Food Insecurity (10/12/2022)   Hunger Vital Sign    Worried About Running Out of Food in the Last Year: Never true    Ran Out of Food in the Last Year: Never true  Transportation Needs: No Transportation Needs (10/12/2022)   PRAPARE - Administrator, Civil Service (Medical): No    Lack of Transportation (Non-Medical): No  Physical Activity: Insufficiently Active (11/12/2017)   Exercise Vital Sign    Days of Exercise per Week: 5 days    Minutes of Exercise per Session: 20 min  Stress: No Stress Concern Present (11/12/2017)   [employer] of Occupational Health - Occupational Stress Questionnaire    Feeling of Stress : Not at all  Social Connections: Moderately Integrated (11/12/2017)   Social Connection and Isolation Panel [NHANES]    Frequency of Communication with Friends and Family: Twice a week    Frequency of Social Gatherings with Friends and Family: Twice a week    Attends Religious Services: More than 4 times per year    Active Member of [employer] or Organizations: Yes    Attends Engineer, structural: More than 4 times per year    Marital Status: Never married   Allergies  Allergen Reactions   Oxycodone Nausea And Vomiting   Oxycodone-Acetaminophen Other (See Comments)   Wound Dressing Adhesive    Hydrocodone     Intolerant more than allergic   Tape Itching    Skin Dermatitis/itching (tape adhesive) Skin Dermatitis/itching (tape adhesive)   Tapentadol Itching    Skin Dermatitis/itching (tape adhesive) Skin Dermatitis/itching (tape adhesive)     Medications   Medications Prior to Admission  Medication Sig Dispense Refill Last Dose   acetaminophen (TYLENOL) 500 MG tablet Take 1 tablet (500 mg total) by mouth every 6 (six) hours as needed. 30 tablet 1 10/11/2022   aspirin EC 81 MG tablet  Take 1 tablet (81 mg total) by mouth daily. Swallow whole. 30 tablet 1 10/11/2022   atorvastatin (LIPITOR) 80 MG tablet Take 1 tablet (80 mg total) by mouth every evening. 30 tablet 0 10/11/2022   BREO ELLIPTA 100-25 MCG/ACT AEPB Inhale 1 puff into the lungs daily.   10/11/2022   cinacalcet (SENSIPAR) 30 MG tablet Take 1 tablet (30 mg total) by mouth daily with breakfast. 60 tablet 1 10/11/2022   cyclobenzaprine (FLEXERIL) 10 MG tablet Take 1 tablet (10 mg total) by mouth 3 (three) times daily as needed for muscle spasms. 30 tablet 0 10/11/2022   diclofenac Sodium (VOLTAREN) 1 % GEL  Apply 2 g topically 4 (four) times daily.   10/11/2022   diphenhydrAMINE (BENADRYL) 25 MG tablet Take 25 mg by mouth at bedtime as needed.   10/11/2022   diphenoxylate-atropine (LOMOTIL) 2.5-0.025 MG tablet SMARTSIG:1 Tablet(s) By Mouth Every 12 Hours PRN 30 tablet 1 10/11/2022   epoetin alfa (EPOGEN) 10000 UNIT/ML injection Inject 1 mL (10,000 Units total) into the vein every Monday, Wednesday, and Friday with hemodialysis. 1 mL  Past Week   fluticasone (FLONASE) 50 MCG/ACT nasal spray Place 1 spray into both nostrils daily. 1 g 0 10/11/2022   gabapentin (NEURONTIN) 100 MG capsule Take 1 capsule (100 mg total) by mouth 2 (two) times daily. Additional capsule as needed once daily for pain, total maximum daily dose 3 capsules (300 mg) 90 capsule 0 10/11/2022   glucose 4 GM chewable tablet Chew 1 tablet (4 g total) by mouth as needed for low blood sugar. 50 tablet 1 prn at unk   insulin NPH-regular Human (NOVOLIN 70/30) (70-30) 100 UNIT/ML injection Inject 28 Units into the skin 2 (two) times daily with a meal. 10 mL 1 10/10/2022   levETIRAcetam (KEPPRA) 1000 MG tablet Take 1 tablet (1,000 mg total) by mouth daily. 30 tablet 0 10/11/2022   lidocaine (LIDODERM) 5 % Place 1 patch onto the skin daily. Remove & Discard patch within 12 hours or as directed by MD 30 patch 0 prn at unk   losartan (COZAAR) 100 MG tablet Take 1 tablet (100 mg  total) by mouth at bedtime. 30 tablet 0 10/11/2022   metoprolol succinate (TOPROL-XL) 100 MG 24 hr tablet Take 1 tablet (100 mg total) by mouth daily. 30 tablet 0 10/11/2022   omeprazole (PRILOSEC) 20 MG capsule Take 1 capsule (20 mg total) by mouth 2 (two) times daily. 60 capsule 0 10/11/2022   polyethylene glycol (MIRALAX / GLYCOLAX) 17 g packet Take 17 g by mouth daily. 14 each 0 prn at unk   senna-docusate (SENOKOT-S) 8.6-50 MG tablet Take 2 tablets by mouth at bedtime as needed for mild constipation or moderate constipation.   prn at unk   sertraline (ZOLOFT) 25 MG tablet Take 1 tablet (25 mg total) by mouth daily. 30 tablet 0 10/11/2022   torsemide (DEMADEX) 100 MG tablet Take 1 tablet (100 mg total) by mouth daily. 30 tablet 0 10/11/2022   traMADol (ULTRAM) 50 MG tablet Take 50 mg by mouth 2 (two) times daily.   10/11/2022   traZODone (DESYREL) 50 MG tablet Take 1 tablet (50 mg total) by mouth at bedtime. 30 tablet 0 10/11/2022   vitamin D3 (CHOLECALCIFEROL) 25 MCG tablet Take 1 tablet (1,000 Units total) by mouth daily. 60 tablet 1 10/11/2022   levETIRAcetam (KEPPRA) 250 MG tablet Take 1 tablet (250 mg total) by mouth 3 (three) times a week. 12 tablet 0    umeclidinium-vilanterol (ANORO ELLIPTA) 62.5-25 MCG/ACT AEPB Inhale 1 puff into the lungs daily. (Patient not taking: Reported on 10/13/2022) 30 each 0 Not Taking      Current Facility-Administered Medications:    [START ON 01/11/2023]  stroke: early stages of recovery book, , Does not apply, Once, Delfino Lovett, MD   (feeding supplement) PROSource Plus liquid 30 mL, 30 mL, Oral, TID BM, Sherryll Burger, Vipul, MD, 30 mL at 01/09/23 1257   0.9 %  sodium chloride infusion, , Intravenous, Continuous, Dew, Marlow Baars, MD, Held at 11/16/22 1155   acetaminophen (TYLENOL) tablet 650 mg, 650 mg, Oral, Q6H PRN, Marrion Coy, MD, 650 mg at 01/10/23 0153  anticoagulant sodium citrate solution 5 mL, 5 mL, Intracatheter, PRN, Wyn Quaker, Marlow Baars, MD   aspirin EC tablet 325 mg,  325 mg, Oral, Daily, Sherryll Burger, Vipul, MD   atorvastatin (LIPITOR) tablet 40 mg, 40 mg, Oral, Daily, Sherryll Burger, Vipul, MD   calcium carbonate (TUMS - dosed in mg elemental calcium) chewable tablet 400 mg of elemental calcium, 400 mg of elemental calcium, Oral, BID PRN, Marrion Coy, MD   Chlorhexidine Gluconate Cloth 2 % PADS 6 each, 6 each, Topical, Q0600, Lorain Childes, MD, 6 each at 01/10/23 0619   cinacalcet (SENSIPAR) tablet 60 mg, 60 mg, Oral, Q supper, Mosetta Pigeon, MD, 60 mg at 01/09/23 1715   epoetin alfa (EPOGEN) injection 4,000 Units, 4,000 Units, Intravenous, Q M,W,F-HD, Breeze, Shantelle, NP, 4,000 Units at 01/09/23 1050   feeding supplement (NEPRO CARB STEADY) liquid 237 mL, 237 mL, Oral, TID BM, Dew, Marlow Baars, MD, 237 mL at 01/09/23 2343   gabapentin (NEURONTIN) capsule 100 mg, 100 mg, Oral, Once per day on Mon Wed Fri, Dew, Jason S, MD, 100 mg at 01/09/23 2344   guaiFENesin (ROBITUSSIN) 100 MG/5ML liquid 5 mL, 5 mL, Oral, Q4H PRN, Sunnie Nielsen, DO, 5 mL at 12/22/22 1700   heparin injection 5,000 Units, 5,000 Units, Subcutaneous, Q8H, Sunnie Nielsen, DO, 5,000 Units at 01/10/23 1342   hydrALAZINE (APRESOLINE) injection 20 mg, 20 mg, Intravenous, Q6H PRN, Annice Needy, MD   levETIRAcetam (KEPPRA) tablet 1,000 mg, 1,000 mg, Oral, Daily, Dew, Marlow Baars, MD, 1,000 mg at 01/09/23 1258   levETIRAcetam (KEPPRA) tablet 250 mg, 250 mg, Oral, Q M,W,F, Dew, Jason S, MD, 250 mg at 01/09/23 1715   lidocaine (LIDODERM) 5 % 1 patch, 1 patch, Transdermal, Q24H, Marrion Coy, MD, 1 patch at 01/09/23 1258   lidocaine (PF) (XYLOCAINE) 1 % injection 5 mL, 5 mL, Intradermal, PRN, Dew, Marlow Baars, MD   lidocaine-prilocaine (EMLA) cream 1 Application, 1 Application, Topical, PRN, Wyn Quaker, Marlow Baars, MD   LORazepam (ATIVAN) tablet 0.5 mg, 0.5 mg, Oral, Q4H PRN, Mansy, Jan A, MD   metoCLOPramide (REGLAN) injection 10 mg, 10 mg, Intravenous, Daily PRN, Marrion Coy, MD   OLANZapine zydis (ZYPREXA) disintegrating  tablet 2.5 mg, 2.5 mg, Oral, Q6H PRN, Mansy, Jan A, MD, 2.5 mg at 01/09/23 2124   ondansetron (ZOFRAN-ODT) disintegrating tablet 4 mg, 4 mg, Oral, Q6H PRN, Annice Needy, MD, 4 mg at 01/05/23 0959   pantoprazole (PROTONIX) EC tablet 40 mg, 40 mg, Oral, BID, Marrion Coy, MD, 40 mg at 01/09/23 2344   phenol (CHLORASEPTIC) mouth spray 1 spray, 1 spray, Mouth/Throat, PRN, Sunnie Nielsen, DO, 1 spray at 12/19/22 1201   QUEtiapine (SEROQUEL) tablet 25 mg, 25 mg, Oral, QHS, Dew, Marlow Baars, MD, 25 mg at 01/09/23 2344   senna-docusate (Senokot-S) tablet 1 tablet, 1 tablet, Oral, QHS PRN, Annice Needy, MD, 1 tablet at 12/13/22 2028   sertraline (ZOLOFT) tablet 25 mg, 25 mg, Oral, Daily, Dew, Marlow Baars, MD, 25 mg at 01/08/23 1026   sodium chloride flush (NS) 0.9 % injection 10-40 mL, 10-40 mL, Intracatheter, PRN, Annice Needy, MD, 10 mL at 10/24/22 2102   sodium chloride flush (NS) 0.9 % injection 10-40 mL, 10-40 mL, Intracatheter, Q12H, Clapacs, John T, MD, 10 mL at 01/10/23 0810   sodium chloride flush (NS) 0.9 % injection 10-40 mL, 10-40 mL, Intracatheter, PRN, Clapacs, John T, MD, 10 mL at 12/19/22 2154   umeclidinium-vilanterol (ANORO ELLIPTA) 62.5-25 MCG/ACT 1 puff, 1 puff, Inhalation, Daily, Festus Barren  S, MD, 1 puff at 01/08/23 1642  Vitals   Vitals:   01/09/23 2043 01/10/23 0410 01/10/23 0824 01/10/23 1155  BP: 128/75 107/74 134/81 97/60  Pulse:  98 (!) 108 100  Resp: 18 18 18 17   Temp: 98.4 F (36.9 C) 98 F (36.7 C) 97.9 F (36.6 C) (!) 97.3 F (36.3 C)  TempSrc:  Oral    SpO2: 96% 96% 97% 95%  Weight:      Height:         Body mass index is 24.61 kg/m.  Physical Exam   Physical Exam Gen: very somnolent Resp: CTAB, no w/r/r CV: RRR, no m/g/r; nml S1 and S2. 2+ symmetric peripheral pulses.  Neuro: *MS: very somnolent, arouses to sternal rub only then falls back asleep. She does not follow most simple commands and the only question she could answer for me was her name. *Speech:  moderate dysarthria, cannot stay awake to participate in evaluation for aphasia *CN: PERRL, blinks to threat bilat, EOMI, face symmetric at rest, hearing intact to voice *Motor & sensory: withdraws to noxious stimuli on L side more briskly than on R *Coordination, gait: UTA 2/2 mental status  NIHSS (most points are a result of her lethargy which also prevents evaluation of her actual deficits)  1a Level of Conscious.: 2 1b LOC Questions: 2 1c LOC Commands: 2 2 Best Gaze: 0 3 Visual: 0 4 Facial Palsy: 0 5a Motor Arm - left: 3 5b Motor Arm - Right: 3 6a Motor Leg - Left: 3 6b Motor Leg - Right: 3 7 Limb Ataxia: 0 8 Sensory: 1 9 Best Language: 2 10 Dysarthria: 1 11 Extinct. and Inatten.: 0  TOTAL: 22   Premorbid mRS = 4 or 5   Labs   CBC:  Recent Labs  Lab 01/06/23 0856 01/09/23 0800  WBC 6.0 7.3  HGB 10.4* 10.7*  HCT 35.5* 35.2*  MCV 88.5 86.7  PLT 198 235    Basic Metabolic Panel:  Lab Results  Component Value Date   NA 140 01/09/2023   K 3.5 01/09/2023   CO2 26 01/09/2023   GLUCOSE 135 (H) 01/09/2023   BUN 35 (H) 01/09/2023   CREATININE 7.35 (H) 01/09/2023   CALCIUM 9.6 01/09/2023   GFRNONAA 5 (L) 01/09/2023   GFRAA 5 (L) 01/30/2020   Lipid Panel:  Lab Results  Component Value Date   LDLCALC 88 01/10/2023   HgbA1c:  Lab Results  Component Value Date   HGBA1C 5.1 01/10/2023   Urine Drug Screen:     Component Value Date/Time   LABOPIA NONE DETECTED 11/14/2017 2156   COCAINSCRNUR NONE DETECTED 11/14/2017 2156   LABBENZ NONE DETECTED 11/14/2017 2156   AMPHETMU NONE DETECTED 11/14/2017 2156   THCU POSITIVE (A) 11/14/2017 2156   LABBARB NONE DETECTED 11/14/2017 2156    Alcohol Level     Component Value Date/Time   ETH <10 03/13/2018 2203    MRI brain wo contrast 6/17 Acute infarct in the posterior L MCA territory (personal review). She also has e/o multiple remote infarcts.   Impression   This is a 72 yo woman with numerous  cerebrovascular risk factors including DM2, HTN, HL and is also ESRD on HD who developed expressive aphasia 2 days ago 2/2 acute ischemic L MCA stroke. Neurology is consulted for further guidance on workup and mgmt of stroke.  Her stroke does not explain her somnolence. I discussed this with Dr. Delfino Lovett who suspected her mental status may be 2/2  polypharmacy and pain medications. We decided to d/c fentanyl patch for now and re-evaluate.  Recommendations   - Patient is 48 hrs out from event, no indication for permissive HTN but please avoid hypotension - MRA head - Carotid US - TTE - Check A1c and LDL + add statin per guidelines - ASA 81mg  daily + plavix 75mg  daily x21 days f/b plavix 75mg  daily monotherapy after that - q4 hr neuro checks - STAT head CT for any change in neuro exam - Tele - PT/OT/SLP - Stroke education - Amb referral to neurology upon discharge  Will continue to follow  ______________________________________________________________________   Thank you for the opportunity to take part in the care of this patient. If you have any further questions, please contact the neurology consultation attending.  Signed,  Bing Neighbors, MD [facility] [phone]  If 7pm- 7am, please page neurology on call as listed in AMION.  **Any copied and pasted documentation in this note was written by me in another application not billed for and pasted by me into this document.

## 2023-01-10 NOTE — Evaluation (Signed)
Clinical/Bedside Swallow Evaluation Patient Details  Name: Cynthia Dean MRN: [id number] Date of Birth: February 09, 1951  Today's Date: 01/10/2023 Time: SLP Start Time (ACUTE ONLY): 0915 SLP Stop Time (ACUTE ONLY): 1015 SLP Time Calculation (min) (ACUTE ONLY): 60 min  Past Medical History:  Past Medical History:  Diagnosis Date   Acute on chronic respiratory failure with hypoxia (HCC) 07/30/2019   Acute respiratory failure with hypoxia (HCC) 12/31/2018   Carotid arterial disease (HCC)    a. 02/2018 < bilat ICA stenosis.   COPD (chronic obstructive pulmonary disease) (HCC)    Coronary artery disease    a. 11/2015 MV: EF 57%, no ischemia/infarct.   Cryptogenic stroke (HCC)    a. 10/2017 s/p implantable loop recorder (no Afib to date).   Diabetes mellitus without complication (HCC)    Diarrhea    ESRD on dialysis [facility])    GI bleed    a. 01/2019 EGD/Colonoscopy: Mild gastritis. Colitis.   Heart murmur    a. 12/2018 Echo: EF 55-60%, impaired relaxation. Mod dil LA. Mildly dil RA. Mild Ca2+ of AoV.   Hematemesis 06/27/2017   History of 2019 novel coronavirus disease (COVID-19) 07/30/2019   Hyperkalemia    Hyperlipidemia    Hypertension    Intractable nausea and vomiting 08/08/2015   Nausea & vomiting 05/30/2018   Nausea and vomiting 05/29/2018   Pneumonia due to COVID-19 virus    Past Surgical History:  Past Surgical History:  Procedure Laterality Date   A/V FISTULAGRAM Left 12/20/2016   Procedure: A/V Fistulagram;  Surgeon: Renford Dills, MD;  Location: [facility];  Service: Cardiovascular;  Laterality: Left;   A/V FISTULAGRAM Left 09/22/2022   Procedure: A/V Fistulagram;  Surgeon: Annice Needy, MD;  Location: [facility];  Service: Cardiovascular;  Laterality: Left;   A/V SHUNT INTERVENTION N/A 12/20/2016   Procedure: A/V Shunt Intervention;  Surgeon: Renford Dills, MD;  Location: [facility];  Service: Cardiovascular;  Laterality: N/A;   A/V  SHUNTOGRAM Left 09/11/2017   Procedure: A/V SHUNTOGRAM;  Surgeon: Annice Needy, MD;  Location: [facility];  Service: Cardiovascular;  Laterality: Left;   A/V SHUNTOGRAM Left 11/21/2019   Procedure: A/V SHUNTOGRAM;  Surgeon: Annice Needy, MD;  Location: [facility];  Service: Cardiovascular;  Laterality: Left;   BREAST BIOPSY Bilateral 07/19/2000   neg   BREAST BIOPSY Left 11/03/2021   [place] bx mass at 3:00, venus marker, axilla bx-hyrdo marker, path pending   COLONOSCOPY WITH PROPOFOL N/A 02/16/2019   Procedure: COLONOSCOPY WITH PROPOFOL;  Surgeon: Toledo, Boykin Nearing, MD;  Location: [facility] ENDOSCOPY;  Service: Gastroenterology;  Laterality: N/A;   COLONOSCOPY WITH PROPOFOL N/A 08/31/2022   Procedure: COLONOSCOPY WITH PROPOFOL;  Surgeon: Jaynie Collins, DO;  Location: [facility] ENDOSCOPY;  Service: Gastroenterology;  Laterality: N/A;   DIALYSIS/PERMA CATHETER INSERTION N/A 11/16/2022   Procedure: DIALYSIS/PERMA CATHETER INSERTION;  Surgeon: Annice Needy, MD;  Location: [facility];  Service: Cardiovascular;  Laterality: N/A;   ESOPHAGOGASTRODUODENOSCOPY (EGD) WITH PROPOFOL N/A 02/16/2019   Procedure: ESOPHAGOGASTRODUODENOSCOPY (EGD) WITH PROPOFOL;  Surgeon: Toledo, Boykin Nearing, MD;  Location: [facility] ENDOSCOPY;  Service: Gastroenterology;  Laterality: N/A;   ESOPHAGOGASTRODUODENOSCOPY (EGD) WITH PROPOFOL N/A 08/28/2022   Procedure: ESOPHAGOGASTRODUODENOSCOPY (EGD) WITH PROPOFOL;  Surgeon: Jaynie Collins, DO;  Location: [facility] ENDOSCOPY;  Service: Gastroenterology;  Laterality: N/A;   IR US GUIDE BX ASP/DRAIN  03/25/2022   LOOP RECORDER INSERTION N/A 11/16/2017   Procedure: LOOP RECORDER  INSERTION;  Surgeon: Duke Salvia, MD;  Location: [facility] INVASIVE CV LAB;  Service: Cardiovascular;  Laterality: N/A;   PERIPHERAL VASCULAR CATHETERIZATION Left 02/02/2015   Procedure: A/V Shuntogram/Fistulagram;  Surgeon: Annice Needy, MD;  Location: [facility];  Service:  Cardiovascular;  Laterality: Left;   PERIPHERAL VASCULAR CATHETERIZATION Left 02/02/2015   Procedure: A/V Shunt Intervention;  Surgeon: Annice Needy, MD;  Location: [facility];  Service: Cardiovascular;  Laterality: Left;   PERIPHERAL VASCULAR CATHETERIZATION Left 03/09/2015   Procedure: A/V Shuntogram/Fistulagram;  Surgeon: Annice Needy, MD;  Location: [facility];  Service: Cardiovascular;  Laterality: Left;   PERIPHERAL VASCULAR CATHETERIZATION N/A 03/09/2015   Procedure: A/V Shunt Intervention;  Surgeon: Annice Needy, MD;  Location: [facility];  Service: Cardiovascular;  Laterality: N/A;   PERIPHERAL VASCULAR CATHETERIZATION Left 04/17/2015   Procedure: Upper Extremity Angiography;  Surgeon: Annice Needy, MD;  Location: [facility];  Service: Cardiovascular;  Laterality: Left;   PERIPHERAL VASCULAR CATHETERIZATION  04/17/2015   Procedure: Upper Extremity Intervention;  Surgeon: Annice Needy, MD;  Location: [facility];  Service: Cardiovascular;;   PERIPHERAL VASCULAR CATHETERIZATION N/A 08/10/2015   Procedure: A/V Shuntogram/Fistulagram;  Surgeon: Annice Needy, MD;  Location: [facility];  Service: Cardiovascular;  Laterality: N/A;   PERIPHERAL VASCULAR CATHETERIZATION N/A 08/10/2015   Procedure: A/V Shunt Intervention;  Surgeon: Annice Needy, MD;  Location: [facility];  Service: Cardiovascular;  Laterality: N/A;   PERIPHERAL VASCULAR CATHETERIZATION Left 04/11/2016   Procedure: A/V Shuntogram/Fistulagram;  Surgeon: Annice Needy, MD;  Location: [facility];  Service: Cardiovascular;  Laterality: Left;   TEE WITHOUT CARDIOVERSION N/A 11/16/2017   Procedure: TRANSESOPHAGEAL ECHOCARDIOGRAM (TEE);  Surgeon: Antonieta Iba, MD;  Location: [facility];  Service: Cardiovascular;  Laterality: N/A;   TEMPORARY DIALYSIS CATHETER N/A 11/11/2022   Procedure: TEMPORARY DIALYSIS CATHETER;  Surgeon: Annice Needy, MD;  Location: [facility]  [facility];  Service: Cardiovascular;  Laterality: N/A;   HPI:  Pt is a 72 y/o F w/ PMH of multiple medical issues including cryptogenic stroke in 2019, ESRD on HD, HLD, COPD, DM2, HTN, depression, who presents w/ back pain x 3 weeks. She had just been admitted and d/c'd home last month, now returned to the hospital. Pt is a poor historian. The back pain has become progressively worse. The pain is located in the lower back that is stabbing, intermittent w/ radiation down both legs. Pain meds helps with the pain and nothing makes the pain worse. The severity is currently 7/10. Pt denies any recents falls and/or trauma. Pt uses a walker.  Pt also c/o subjective fever. Pt denies any sweating, chest pain, cough, shortness of breath, nausea, vomiting, abd pain, urinary urgency, urinary frequency, diarrhea or constipation. Per MD notes,  admitted for possible Discitis/osteomyelitis at L4-L5: w/ suspected intradiscal abscess as per MRI. 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  Pt was being planned for home d/c again w/ Dialysis but had a change in status.   MRI revealed: Acute cortical ischemia within the posterior left MCA territory.  No acute hemorrhage or mass effect.  2. Multiple old infarcts and findings of chronic microvascular  ischemia.     Assessment / Plan / Recommendation  Clinical Impression   Pt seen for BSE this morning. Pt awakened w/ min+ verbal/tactile stim and sitting up in bed w/ SLP/NT  support (full); muttered/mumbled speech in general but clear and FULLY intetelligible when she was agitated and stated "STOP THAT!" to this SLP w/ attempted trials, then to PT/OT (when sitting her up on the side of the bed). Pt did not follow commands and would not allow oral care/assessment. Pt wearing her upper Denture plate.  On RA, afebrile. WBC WNL.  Pt appears to present w/ risk for oropharyngeal phase dysphagia in setting of declined Cognitive status;  unsure of her Baseline Cognitive function -- she was seen in 2021 for Aphasia (word-finding and comprehension deficits) then. At this assessment today, pt REFUSED po trials presented and became agitated multiple times. OF NOTE: pt has been eating "chicken wings" and other po's until this weekend per NSG report.  ANY Cognitive decline can impact overall awareness/timing of swallow and safety during po tasks which increases risk for aspiration, choking. Pt has also had a recent stroke (see MRI 01/09/23). Due to pt's refusal of po's and limited assessment/dx, an oral diet cannot be recommended this date.    Pt consumed only 4 single trials of ice chips (strongly encouraged) then a pinched straw of Nepro (a favorite per NSG). No overt, clinical s/s of aspiration noted w/ the trials; no immediate no decline in vocal quality, no cough, and no decline in respiratory status during/post trials. Oral phase was adequate for bolus management and oral clearing of the boluses given - she spit out part of the Nepro bolus.  OM Exam was cursory but revealed No unilateral lingual/labial weakness. She adamantly refused oral care, Denture care. She often put her hand to her mouth w/ po presentation and oral tasks stating "NO!", "STOP THAT!".     In setting of pt's current presentation and Cognitive status, and her risk for aspiration/choking, recommend continue NPO status w/ frequent oral care for hygiene and stimulation of swallowing -- as much as she allows. Reduce Distractions. Aspiration precautions.  ST services recommends follow w/ Palliative Care for GOC, education re: impact of Cognitive decline on overall functional status including eating/drinking. Largely suspect that pt's Cognition could hamper upgrade to an oral diet.  ST services will f/u w/ ongoing assessment of swallowing x2 to determine appropriateness for oral diet. Pt is NOT appropriate for a cognitive-linguistic evaluation in her current State and  presentation (uncooperative, easily agitated). Pt has been FULL support for ADLs since admit ~3 months ago now. Pt HAS BASELINE communication deficits per chart notes -- recommend f/u for cognitive-communication assessment/needs post acuity of hospitalization.   MD and NSG updated on above; agreed.  SLP Visit Diagnosis: Dysphagia, unspecified (R13.10) (Cognitive-communication decline)    Aspiration Risk  Moderate aspiration risk;Risk for inadequate nutrition/hydration    Diet Recommendation   NPO w/ frequent oral care  Medication Administration: Via alternative means    Other  Recommendations Recommended Consults:  (Palliative care f/u) Oral Care Recommendations: Oral care QID;Staff/trained caregiver to provide oral care (Denture care) Caregiver Recommendations:  (TBD)    Recommendations for follow up therapy are one component of a multi-disciplinary discharge planning process, led by the attending physician.  Recommendations may be updated based on patient status, additional functional criteria and insurance authorization.  Follow up Recommendations Follow physician's recommendations for discharge plan and follow up therapies (TBD)      Assistance Recommended at Discharge  Full (this has been baseline since admit - ~90 days, per NSG)  Functional Status Assessment Patient has had a recent decline in their functional status and/or demonstrates limited ability to make  significant improvements in function in a reasonable and predictable amount of time  Frequency and Duration min 2x/week  1 week       Prognosis Prognosis for improved oropharyngeal function: Guarded Barriers to Reach Goals: Cognitive deficits;Language deficits;Time post onset;Severity of deficits;Motivation;Behavior Barriers/Prognosis Comment: agitated and refused everything      Swallow Study   General Date of Onset: 10/12/22 HPI: Pt is a 72 y/o F w/ PMH of multiple medical issues including cryptogenic stroke in  2019, ESRD on HD, HLD, COPD, DM2, HTN, depression, who presents w/ back pain x 3 weeks. She had just been admitted and d/c'd home last month, now returned to the hospital. Pt is a poor historian. The back pain has become progressively worse. The pain is located in the lower back that is stabbing, intermittent w/ radiation down both legs. Pain meds helps with the pain and nothing makes the pain worse. The severity is currently 7/10. Pt denies any recents falls and/or trauma. Pt uses a walker.  Pt also c/o subjective fever. Pt denies any sweating, chest pain, cough, shortness of breath, nausea, vomiting, abd pain, urinary urgency, urinary frequency, diarrhea or constipation. Per MD notes,  admitted for possible Discitis/osteomyelitis at L4-L5: w/ suspected intradiscal abscess as per MRI. 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  Pt was being planned for home d/c again w/ Dialysis but had a change in status.  MRI revealed: Acute cortical ischemia within the posterior left MCA territory.  No acute hemorrhage or mass effect.  2. Multiple old infarcts and findings of chronic microvascular  ischemia. Type of Study: Bedside Swallow Evaluation Previous Swallow Assessment: none.  Speech therapy in 2021: seen by Outpt Speech therapy in 2021 - 4 visits attended.  She had Aphasia w/ word finding and comprehension deficits then that were NOT resolved on her last visit charted (then no further contact). Diet Prior to this Study: NPO (regular diet for the last ~90 days of admit until the stroke identified yesterday. she had been eatng "chicken wings" w/ Family per report.) Temperature Spikes Noted: No (wbc 7.3) Respiratory Status: Room air History of Recent Intubation: No Behavior/Cognition: Alert;Agitated;Confused;Distractible;Requires cueing;Doesn't follow directions;Uncooperative (unsure of pt's Baseline Cognition) Oral Cavity Assessment:  (difficult to assess d/t  her poor cooperation - not dry) Oral Care Completed by SLP: No (pt refused by putting hand to mouth) Oral Cavity - Dentition: Dentures, top (few front lower teeth) Vision:  (n/a) Self-Feeding Abilities: Total assist Patient Positioning: Upright in bed (full assist) Baseline Vocal Quality: Normal (mumbled/muttered speech at times; then shouting) Volitional Cough: Cognitively unable to elicit Volitional Swallow: Unable to elicit    Oral/Motor/Sensory Function Overall Oral Motor/Sensory Function: Within functional limits (no unilateral weakness noted w/ bolus management)   Ice Chips Ice chips: Within functional limits Presentation: Spoon (fed 4 trials) Other Comments: refused further   Thin Liquid Thin Liquid: Not tested    Nectar Thick Nectar Thick Liquid: Impaired (spit out some) Presentation:  (fed via Pinched straw x1) Oral Phase Impairments:  (turned head away from presentation) Oral phase functional implications:  (pt spit out some) Pharyngeal Phase Impairments:  (nne w/ 1 pharyngeal swallow observed)   Honey Thick Honey Thick Liquid: Not tested   Puree Puree: Not tested   Solid     Solid: Not tested        Jerilynn Som, MS, CCC-SLP Speech Language Pathologist Rehab Services; [facility] [place] [phone] (ascom)  Ishmeal Rorie 01/10/2023,10:58 AM

## 2023-01-11 ENCOUNTER — Inpatient Hospital Stay: Payer: 59

## 2023-01-11 MED ORDER — CLOPIDOGREL BISULFATE 75 MG PO TABS
75.0000 mg | ORAL_TABLET | Freq: Every day | ORAL | Status: DC
  Administered 2023-01-11 – 2023-01-12 (×2): 75 mg via ORAL
  Filled 2023-01-11 (×2): qty 1

## 2023-01-11 NOTE — Progress Notes (Signed)
Daily Progress Note   Patient Name: Cynthia Dean       Date: 01/11/2023 DOB: [birth year]  Age: 72 y.o. MRN#: [id number] Attending Physician: Kathrynn Running, MD Primary Care Physician: Loistine Chance, MD Admit Date: 10/12/2022  Reason for Consultation/Follow-up: Establishing goals of care  Subjective: Epic chat received occurring between numerous team members throughout day.  Spoke with staff members.  I was most recently updated the daughter has decided against hospice level care.  In to see patient.  Currently patient is resting in bed eating lunch which is being fed to her by family members.  She is tearful and attempts to speak with me but I am unable to understand what she is saying.  Daughter who is H POA Selena Batten is at bedside.  Selena Batten is currently on the phone at this time and upon my getting ready to leave the room so as not to disturb her, she ends phone call to speak with me.  She states she is confused as her mother is currently eating and drinking, and she did not understand this to be the case yesterday when she made the decision for hospice care.   Kim and I stepped out to a private room to sit and talk.  We reviewed in detail her mother's status and planning when PMT was engaged earlier in the admission. We discussed the planning for home with home HD.    We discussed her understanding of the conversation leading to the decision of hospice care yesterday, based on patient's status yesterday.  Questions answered regarding hospice and comfort care. With much detailed conversation, she states she wants to continue with plan for home HD.  She would like to speak to speech therapy regarding her mother's diet.  She would like to speak with neurology.  Confirmed DNR/DNI/no feeding  tube.  Stepped out and discussed with staff.  Reentered to speak to daughter to confirm that I had relayed her wishes to the team.  Discussed case with attending in person, and discussed with nephrology in person.  Length of Stay: 91  Current Medications: Scheduled Meds:   (feeding supplement) PROSource Plus  30 mL Oral TID BM   aspirin EC  325 mg Oral Daily   atorvastatin  40 mg Oral Daily   Chlorhexidine Gluconate Cloth  6 each  Topical Q0600   cinacalcet  60 mg Oral Q supper   epoetin (EPOGEN/PROCRIT) injection  4,000 Units Intravenous Q M,W,F-HD   feeding supplement (NEPRO CARB STEADY)  237 mL Oral TID BM   gabapentin  100 mg Oral Once per day on Mon Wed Fri   heparin injection (subcutaneous)  5,000 Units Subcutaneous Q8H   levETIRAcetam  1,000 mg Oral Daily   levETIRAcetam  250 mg Oral Q M,W,F   lidocaine  1 patch Transdermal Q24H   pantoprazole  40 mg Oral BID   QUEtiapine  25 mg Oral QHS   sertraline  25 mg Oral Daily   sodium chloride flush  10-40 mL Intracatheter Q12H   umeclidinium-vilanterol  1 puff Inhalation Daily    Continuous Infusions:  sodium chloride Stopped (11/16/22 1155)   anticoagulant sodium citrate      PRN Meds: acetaminophen, anticoagulant sodium citrate, calcium carbonate, guaiFENesin, hydrALAZINE, lidocaine (PF), lidocaine-prilocaine, LORazepam, metoCLOPramide (REGLAN) injection, OLANZapine zydis, ondansetron, phenol, senna-docusate, sodium chloride flush, sodium chloride flush  Physical Exam Pulmonary:     Effort: Pulmonary effort is normal.  Neurological:     Mental Status: She is alert.             Vital Signs: BP 107/66 (BP Location: Right Arm)   Pulse (!) 106   Temp (!) 97.5 F (36.4 C) (Oral)   Resp 18   Ht 6' (1.829 m)   Wt 82.3 kg   SpO2 91%   BMI 24.61 kg/m  SpO2: SpO2: 91 % O2 Device: O2 Device: Room Air O2 Flow Rate: O2 Flow Rate (L/min): 1 L/min  Intake/output summary: No intake or output data in the 24 hours ending  01/11/23 1453 LBM: Last BM Date : 01/09/23 Baseline Weight: Weight: 113.4 kg Most recent weight: Weight: 82.3 kg     Patient Active Problem List   Diagnosis Date Noted   Goals of care, counseling/discussion 01/10/2023   Hypokalemia 01/04/2023   Hypophosphatemia 01/04/2023   Knee osteomyelits, right (HCC) 12/26/2022   Malnutrition of moderate degree 12/14/2022   Psoas abscess (HCC) 10/17/2022   Back pain 10/12/2022   Discitis of lumbar region 10/12/2022   Myositis 10/12/2022   Hemoglobin drop 08/28/2022   Chronic respiratory failure with hypoxia (HCC) 08/26/2022   Acute on chronic blood loss anemia 08/26/2022   History of lower GI bleeding 08/26/2022   Insulin-requiring or dependent type II diabetes mellitus (HCC) 08/26/2022   Chronic anticoagulation 08/26/2022   History of pulmonary embolism 03/20/2022 08/26/2022   Breast cancer, left S/P partial mastectomy,03/04/2022 08/26/2022   Generalized weakness    Unable to ambulate 05/09/2022   ESRD on dialysis [facility])    Bilateral leg pain    Depression    Pulmonary emboli (HCC)    Obesity with body mass index (BMI) of 30.0 to 39.9    Anemia    Chronic diastolic CHF (congestive heart failure) (HCC)    Seroma of breast    Bacteremia due to coagulase-negative Staphylococcus    Left breast abscess    Urinary tract infection 03/22/2022   Anemia of chronic disease 03/22/2022   Acute pulmonary embolism without acute cor pulmonale (HCC) 03/20/2022   Dyslipidemia 03/20/2022   Diabetic neuropathy (HCC) 03/20/2022   Sepsis due to cellulitis (HCC) 03/20/2022   Delay kidney tx func d/t fluid overload requiring acute dialysis (HCC) 01/29/2020   Delay kidney tx func d/t ATN and fluid overload require acute dialysis (HCC) 01/29/2020   Hypoglycemia 10/21/2019   Unresponsiveness 10/21/2019  GERD (gastroesophageal reflux disease) 10/21/2019   Hypothermia    Sepsis with encephalopathy without septic shock (HCC)    Diarrhea    Acute metabolic  encephalopathy    Acute on chronic diastolic CHF (congestive heart failure) (HCC)    Acute on chronic respiratory failure with hypoxia (HCC) 07/30/2019   Rectal bleed 02/13/2019   Acute respiratory failure with hypoxia (HCC) 12/31/2018   Nausea & vomiting 05/30/2018   History of CVA (cerebrovascular accident) 05/29/2018   Aphasia as late effect of cerebrovascular accident (CVA) 05/01/2018   Seizure disorder (HCC) 03/14/2018   Stroke (HCC) 03/07/2018   Slurred speech 11/14/2017   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) 11/12/2017   CAD (coronary artery disease) 11/12/2017   COPD (chronic obstructive pulmonary disease) (HCC) 11/12/2017   ESRD on hemodialysis (HCC) 11/12/2017   CVA (cerebral vascular accident) (HCC) 04/14/2016   Essential hypertension 11/27/2015   Hyperlipidemia 11/27/2015   Prolonged QT interval 11/26/2015   Aphasia complicating stroke 11/05/2015   Cervical spine arthritis 07/27/2015   Diabetic retinopathy without macular edema associated with type 2 diabetes mellitus (HCC) 07/27/2015   Osteoarthritis of spine with radiculopathy, lumbar region 07/27/2015   Obesity, Class III, BMI 40-49.9 (morbid obesity) (HCC) 07/27/2015   Vitamin D deficiency 01/21/2011   Diastolic heart failure (HCC) 10/27/2010    Palliative Care Assessment & Plan     Recommendations/Plan: Family would like to speak with neurology Family would like to speak with speech therapy Family would like home HD DNR/DNI/no feeding tube. Discussed outpatient palliative and transition to hospice when patient and family are ready.   Code Status:    Code Status Orders  (From admission, onward)           Start     Ordered   11/01/22 1535  Do not attempt resuscitation (DNR)  Continuous       Question Answer Comment  If patient has no pulse and is not breathing Do Not Attempt Resuscitation   If patient has a pulse and/or is breathing: Medical Treatment Goals COMFORT  MEASURES: Keep clean/warm/dry, use medication by any route; positioning, wound care and other measures to relieve pain/suffering; use oxygen, suction/manual treatment of airway obstruction for comfort; do not transfer unless for comfort needs.   Consent: Discussion documented in EHR or advanced directives reviewed      11/01/22 1534           Code Status History     Date Active Date Inactive Code Status Order ID Comments User Context   10/12/2022 1101 11/01/2022 1534 Full Code [id number]  Charise Killian, MD Inpatient   08/26/2022 2018 09/09/2022 2244 Full Code [id number]  Andris Baumann, MD ED   05/09/2022 0919 05/17/2022 0109 Full Code [id number]  Lorretta Harp, MD ED   03/20/2022 0121 04/02/2022 0126 Full Code [id number]  Mansy, Vernetta Honey, MD ED   01/29/2020 2038 01/30/2020 2215 Full Code [id number]  Lucile Shutters, MD Inpatient   01/29/2020 1210 01/29/2020 2038 Full Code [id number]  Lucile Shutters, MD Inpatient   10/21/2019 0902 10/25/2019 2211 Full Code [id number]  Lorretta Harp, MD ED   07/30/2019 2224 08/11/2019 2130 Full Code [id number]  Mansy, Vernetta Honey, MD ED   02/13/2019 1329 02/16/2019 1849 Full Code [id number]  Jimmye Norman, NP ED   12/31/2018 1159 01/02/2019 1330 Full Code [id number]  Jimmye Norman, NP ED   05/29/2018 1541 05/30/2018 2041 Full Code [id number]  Alford Highland, MD ED   03/14/2018 [phone]  03/15/2018 0336 Full Code [id number]  Cammy Copa, MD Inpatient   03/08/2018 0005 03/09/2018 2108 Full Code [id number]  Altamese Dilling, MD Inpatient   11/15/2017 0017 11/17/2017 1809 Full Code [id number]  Oralia Manis, MD ED   11/12/2017 2219 11/13/2017 1935 Full Code [id number]  Oralia Manis, MD ED   04/14/2016 1330 04/14/2016 1700 Full Code [id number]  Adrian Saran, MD ED   08/08/2015 2257 08/09/2015 2054 Full Code [id number]  Hower, Cletis Athens, MD ED   04/17/2015 1334 04/17/2015 1836 Full Code [id number]  Dew, Marlow Baars, MD Inpatient      Advance Directive Documentation    Flowsheet Row Most Recent Value   Type of Advance Directive Healthcare Power of Attorney  Pre-existing out of facility DNR order (yellow form or pink MOST form) --  "MOST" Form in Place? --       Prognosis:  Unable to determine  1:00-3:15   Thank you for allowing the Palliative Medicine Team to assist in the care of this patient.   Morton Stall, NP  Please contact Palliative Medicine Team phone at [phone] for questions and concerns.

## 2023-01-11 NOTE — Progress Notes (Signed)
Progress Note   [name] WJX:[id number] DOB: 1950-12-14 DOA: 10/12/2022     91 DOS: the patient was seen and examined on 01/11/2023   Brief hospital course: Cynthia Dean is a 72 y.o. 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 She presents to the ED with worsening lower extremity pain. Admitted for possible Discitis/osteomyelitis at L4-L5: w/ suspected intradiscal abscess as per MRI. 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   4/18, HD access / fistula clotted off during dialysis.  4/19 vascular placed R femoral temp HD cath placed, resumed HD and pemcath placed 4/24.  5/14- back pain seems to be well-controlled.  Caseworkers have discussed the case, involved ethics committee/Hospital administration. Looking into home dialysis.  Lacks capacity to make informed decision.   6/17: New expressive aphasia since yesterday per nursing, MRI brain  6/18: New stroke confirmed on MRI, neuro consult, stroke workup, palliative care reeval, stop fentanyl  6/19: family declines comfort care/hospice, wants to continue to pursue home dialysis  Assessment and Plan:  Nausea and vomiting Prolonged QT interval. Now resolved.  Acute stroke Due to patient's new onset aphasia and lethargy MRI of the brain was obtained last night which confirmed Acute cortical ischemia within the posterior left MCA territory.  Neurology consult pending.   Start aspirin, statin, PT, OT, speech therapy evaluation. Tte without acute problem  Right knee pain due to osteoarthritis. Plain right knee pain, x-ray showed mild effusion with osteoarthritis.  With her lethargy will hold off fentanyl for now   Lumbar discitis, L4-L5 epidural phlegmon and b/l psoas abscess on MRI S/p IR aspiration of psoas abscess on  10/13/22 Low back pain, Bilateral Leg Pain - due to above Vanco and cefepime on 10/13/22 after procedure.  Completed Keflex Wound cx grew staph capitis.   ID seen. Not a good surgical candidate as per neuro surg.   Pt having severe low back and bilateral leg pain and has not been able to tolerate any position except laying nearly flat in the bed S/p cefazolin, for 6 weeks, completed: 12/02/22 Continue symptomatic treatment with pain medicine (lidocaine prn while at HD and as needed acetaminophen).  stopped fentanyl patch due to lethargy   Confusion and agitation - improved Hospital delirium  Likely multifactorial - delirium, stroke   Seizure disorder Home keppra   ESRD: on HD MWF.  Anemia of chronic kidney disease. Hypokalemia Hypophosphatemia. Dialysis per nephrology team. [facility] and nephrology team is working/trying to set up home HD, equipments are scheduled to get delivered this Thursday.   Generalized weakness / Physical Debility:  Family desires home w/ home health   Severe back and bilateral leg pain due to severe multi-level lumbar degenerative disc disease. Peripheral neuropathy. Right foot pain. Pt has bulging discs at L2-3, L3-4, L4-5 and L5-S1. Pt will continue to have back and leg pain once antibiotics are complete. Continue pain medicine as above.   COPD: w/o exacerbation.  Continue on bronchodilators    DM2: well controlled HbA1c 5.2.          Subjective: crying when I evaluate her  Physical Exam: Vitals:   01/11/23 0535 01/11/23 0540 01/11/23 0735 01/11/23 0754  BP:   112/60 107/66  Pulse: (!) 35 (!) 120 (!) 108 (!) 106  Resp: 12  15 18   Temp:    (!) 97.5 F (36.4 C)  TempSrc:  Oral  SpO2: (!) 89%   91%  Weight:      Height:       General exam: crying  Respiratory system: Clear to auscultation. Respiratory effort normal. Cardiovascular system: Regular rate and rhythm Gastrointestinal system: Soft, benign Central nervous system: Disoriented,  awake, moving all 4 Extremities: Symmetric 5 x 5 power. Skin: No rashes, lesions or ulcers  Data Reviewed:  MRI of the brain shows Acute cortical ischemia within the posterior left MCA territory  Family Communication: Updated daughter Cynthia Dean over phone  Disposition: Status is: Inpatient Remains inpatient appropriate because: pending arrangement for home dialysis  Planned Discharge Destination: home with home dialysis   DVT prophylaxis-heparin Time spent: 35 minutes  Author: Silvano Bilis, MD 01/11/2023 2:37 PM  For on call review [email or web address].

## 2023-01-11 NOTE — Progress Notes (Signed)
Speech Language Pathology Treatment: Dysphagia  Patient Details Name: Cynthia Dean MRN: [id number] DOB: 01/02/1951 Today's Date: 01/11/2023 Time: 4098-1191 SLP Time Calculation (min) (ACUTE ONLY): 45 min  Assessment / Plan / Recommendation Clinical Impression  Pt seen for ongoing assessment of swallowing today; trials to upgrade to oral diet if appropriate. Pt was awake and min verbal this morning -- she indicated interest and eagerness in having something to drink: gingerale vs sprite. Her State and engagement were much improved vs at BSE yesterday.   Pt explained general aspiration precautions and assisted in sitting upright in bed w/ pillows behind back. Pt assisted w/ positioning d/t min weakness and Confusion -- noted Cognitive-communication deficits c/b jargon speech, perseverations. She reached for the cup to hold then fed herself sips of liquids (her choices) w/ No overt clinical s/s of aspiration noted. SLP assisted in feeding her bites of Vanilla pudding (her choice) w/ same result. No overt clinical s/s of aspiration were noted w/ any consistency; respiratory status remained calm and unlabored, vocal quality clear b/t trials, no coughing. Oral phase appeared grossly [facility] for bolus management and timely A-P transfer for swallowing; oral clearing achieved w/ all consistencies. Pt did not want much po's -- gestured for them to be put on her tray table after a few sips/bites.   Pt appears at reduced risk for aspriation when following general aspiration precautions and given Supervision w/ po's. Recommend a Pureed diet for ease of foods, gravies added to moisten/flavor foods; Thin liquids. Recommend general aspiration precautions; Supervision w/ all oral intake and support w/ feeding as needed. Pills CRUSHED in Puree.  ST services discussed the above w/ MD/Team for agreement w/ oral diet. ST services can be available for further education and assessment if needed while admitted. NSG  updated. Precautions posted at bedside.      HPI HPI: Pt is a 72 y/o F w/ PMH of multiple medical issues including cryptogenic stroke in 2019, ESRD on HD, HLD, COPD, DM2, HTN, depression, who presents w/ back pain x 3 weeks. She had just been admitted and d/c'd home last month, now returned to the hospital. Pt is a poor historian. The back pain has become progressively worse. The pain is located in the lower back that is stabbing, intermittent w/ radiation down both legs. Pain meds helps with the pain and nothing makes the pain worse. The severity is currently 7/10. Pt denies any recents falls and/or trauma. Pt uses a walker.  Pt also c/o subjective fever. Pt denies any sweating, chest pain, cough, shortness of breath, nausea, vomiting, abd pain, urinary urgency, urinary frequency, diarrhea or constipation. Per MD notes,  admitted for possible Discitis/osteomyelitis at L4-L5: w/ suspected intradiscal abscess as per MRI. 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  Pt was being planned for home d/c again w/ Dialysis but had a change in status.  MRI revealed: Acute cortical ischemia within the posterior left MCA territory.  No acute hemorrhage or mass effect.  2. Multiple old infarcts and findings of chronic microvascular  ischemia.      SLP Plan  Continue with current plan of care      Recommendations for follow up therapy are one component of a multi-disciplinary discharge planning process, led by the attending physician.  Recommendations may be updated based on patient status, additional functional criteria and insurance authorization.    Recommendations  Diet recommendations: Dysphagia 1 (puree);Thin liquid Liquids provided via: Cup;Straw (monitor) Medication Administration: Crushed with  puree Supervision: Patient able to self feed;Staff to assist with self feeding;Full supervision/cueing for compensatory strategies Compensations: Minimize  environmental distractions;Slow rate;Small sips/bites;Lingual sweep for clearance of pocketing;Follow solids with liquid Postural Changes and/or Swallow Maneuvers: Out of bed for meals;Seated upright 90 degrees;Upright 30-60 min after meal                 (Palliative, Dietician following) Oral care BID;Oral care before and after PO;Staff/trained caregiver to provide oral care (Denture care)   Frequent or constant Supervision/Assistance Dysphagia, unspecified (R13.10) (Cognitive-communication decline)     Continue with current plan of care      Jerilynn Som, MS, CCC-SLP Speech Language Pathologist Rehab Services; [facility] [phone] (ascom) Jacquez Sheetz  01/11/2023, 1:59 PM

## 2023-01-11 NOTE — Progress Notes (Signed)
       CROSS COVER NOTE  NAME: Cynthia Dean MRN: [id number] DOB : 22-Oct-1950    Concern as stated by nurse / staff   Called from [facility] Radiology with carotid ultrasound findings IMPRESSION: 1. Bilateral proximal internal carotid artery calcified plaque with Doppler measurements consistent with less than 50% stenosis. 2. Partially occlusive acute appearing thrombus seen in the left internal jugular vein.      Pertinent findings on chart review: H & P progress reviewed. Currently on DAPT for new ischemic stroke 6/17. Consult to Vascular Surgery. Spoke with Dr Lorretta Harp who reviewed images and found thrombus characteristics tp be more more subacut/chronic in nature. He recommended no heparin at this time and would treat with eliquis prevention dose of 2.5 mg. Formal consult and orders to follow  Assessment and  Interventions   Assessment:  Plan: Continue treatment plan as determined by neuro and vascular surgery       Donnie Mesa NP [facility] Cross Cover 7pm-7am - check amion for availability Pager [phone]

## 2023-01-11 NOTE — Progress Notes (Signed)
Yellow mews guidelines implemented  01/11/23 1635  Assess: MEWS Score  BP (!) 95/52  MAP (mmHg) (!) 63  Resp 16  SpO2 (!) 87 %  Assess: MEWS Score  MEWS Temp 0  MEWS Systolic 1  MEWS Pulse 1  MEWS RR 0  MEWS LOC 0  MEWS Score 2  MEWS Score Color Yellow  Assess: if the MEWS score is Yellow or Red  Were vital signs taken at a resting state? Yes  Focused Assessment No change from prior assessment  Does the patient meet 2 or more of the SIRS criteria? No  MEWS guidelines implemented  Yes, yellow  Treat  MEWS Interventions Considered administering scheduled or prn medications/treatments as ordered  Take Vital Signs  Increase Vital Sign Frequency  Yellow: Q2hr x1, continue Q4hrs until patient remains green for 12hrs  Escalate  MEWS: Escalate Yellow: Discuss with charge nurse and consider notifying provider and/or RRT  Notify: Charge Nurse/RN  Name of Charge Nurse/RN Notified Debi Pinkerton, RN  Assess: SIRS CRITERIA  SIRS Temperature  0  SIRS Pulse 1  SIRS Respirations  0  SIRS WBC 0  SIRS Score Sum  1

## 2023-01-11 NOTE — Plan of Care (Addendum)
Epic chat update received that daughter has decided on hospice care. Hospice liaison for [facility] is now working with family to help determine planning moving forward.  PMT will shadow.

## 2023-01-11 NOTE — Discharge Planning (Signed)
Informed that Daughter has decided to continue Home Hemo route. Called [place], Home Hemo RN, requested that machine/supplies to be delivered and to continue to plan on in-home training possibly next week. Still in search of a hospital bed with a scale, as this is a requirement for Home Hemo. Reaching out to [place], [place] FA with [employer], to inquire which DME companies supply these types of beds.

## 2023-01-11 NOTE — Progress Notes (Signed)
VS rechecked for Red mews,  Manually checked heart at 120 and tele monitor was 110.

## 2023-01-11 NOTE — Progress Notes (Addendum)
Brief Nutrition Follow-Up Note  Case discussed with SLP. Pt was advanced to a dysphagia 1 diet with thin liquids today. Pt is eating minimally, mostly sips of water and chocolate pudding.   Per palliative care, comfort care has now been rescinded and pt daughter has decided to continue to pursue home hemo as pt is now able to take PO's. Per palliative care note, pt does not desire a feeding tube. Pt with erratic intake and has had historically variable acceptance of supplements.   Continue with current interventions:  -Continue 30 ml Prosource Plus to TID, each supplement provides 100 kcals and 15 grams protein -Continue Nepro Shake po TID, each supplement provides 425 kcal and 19 grams protein  -Continue feeding assistance with meals  RD will continue to sign off as there is nothing further than can be offered from a nutritional standpoint. Pt has been identified with moderate malnutrition in the context of chronic illness and suspect continued nutritional decline. If further nutritional issues arise, please re-consult RD.   Levada Schilling, RD, LDN, CDCES Registered Dietitian II Certified Diabetes Care and Education Specialist Please refer to [facility] for RD and/or RD on-call/weekend/after hours pager

## 2023-01-12 LAB — CBC
HCT: 35.6 % — ABNORMAL LOW (ref 36.0–46.0)
Hemoglobin: 10.7 g/dL — ABNORMAL LOW (ref 12.0–15.0)
MCH: 26 pg (ref 26.0–34.0)
MCHC: 30.1 g/dL (ref 30.0–36.0)
MCV: 86.6 fL (ref 80.0–100.0)
Platelets: 200 10*3/uL (ref 150–400)
RBC: 4.11 MIL/uL (ref 3.87–5.11)
RDW: 18.8 % — ABNORMAL HIGH (ref 11.5–15.5)
WBC: 7.9 10*3/uL (ref 4.0–10.5)
nRBC: 0 % (ref 0.0–0.2)

## 2023-01-12 LAB — BASIC METABOLIC PANEL
Anion gap: 11 (ref 5–15)
BUN: 45 mg/dL — ABNORMAL HIGH (ref 8–23)
CO2: 27 mmol/L (ref 22–32)
Calcium: 8.9 mg/dL (ref 8.9–10.3)
Chloride: 98 mmol/L (ref 98–111)
Creatinine, Ser: 8.3 mg/dL — ABNORMAL HIGH (ref 0.44–1.00)
GFR, Estimated: 5 mL/min — ABNORMAL LOW (ref >60)
Glucose, Bld: 127 mg/dL — ABNORMAL HIGH (ref 70–99)
Potassium: 4.3 mmol/L (ref 3.5–5.1)
Sodium: 136 mmol/L (ref 135–145)

## 2023-01-12 MED ORDER — MIDAZOLAM HCL 2 MG/2ML IJ SOLN: 2.0000 mg | INTRAMUSCULAR | Status: DC | PRN

## 2023-01-12 MED ORDER — POLYVINYL ALCOHOL 1.4 % OP SOLN: 1.0000 [drp] | Freq: Four times a day (QID) | OPHTHALMIC | Status: DC | PRN

## 2023-01-12 MED ORDER — GLYCOPYRROLATE 0.2 MG/ML IJ SOLN: 0.2000 mg | INTRAMUSCULAR | Status: DC | PRN

## 2023-01-12 MED ORDER — ACETAMINOPHEN 325 MG PO TABS: 650.0000 mg | ORAL_TABLET | Freq: Four times a day (QID) | ORAL | Status: DC | PRN

## 2023-01-12 MED ORDER — HALOPERIDOL LACTATE 5 MG/ML IJ SOLN: 2.5000 mg | INTRAMUSCULAR | Status: DC | PRN

## 2023-01-12 MED ORDER — ONDANSETRON HCL 4 MG/2ML IJ SOLN: 4.0000 mg | Freq: Four times a day (QID) | INTRAMUSCULAR | Status: DC | PRN

## 2023-01-12 MED ORDER — ONDANSETRON 4 MG PO TBDP: 4.0000 mg | ORAL_TABLET | Freq: Four times a day (QID) | ORAL | Status: DC | PRN

## 2023-01-12 MED ORDER — ALTEPLASE 2 MG IJ SOLR
INTRAMUSCULAR | Status: AC
  Filled 2023-01-12: qty 4

## 2023-01-12 MED ORDER — MORPHINE SULFATE (PF) 2 MG/ML IV SOLN
2.0000 mg | INTRAVENOUS | Status: DC | PRN
  Administered 2023-01-12: 2 mg via INTRAVENOUS
  Filled 2023-01-12: qty 1

## 2023-01-12 MED ORDER — ACETAMINOPHEN 650 MG RE SUPP: 650.0000 mg | Freq: Four times a day (QID) | RECTAL | Status: DC | PRN

## 2023-01-12 MED ORDER — SODIUM CHLORIDE 0.9 % IV SOLN: INTRAVENOUS | Status: DC

## 2023-01-12 MED ORDER — DIPHENHYDRAMINE HCL 50 MG/ML IJ SOLN: 25.0000 mg | INTRAMUSCULAR | Status: DC | PRN

## 2023-01-12 MED ORDER — GLYCOPYRROLATE 1 MG PO TABS: 1.0000 mg | ORAL_TABLET | ORAL | Status: DC | PRN

## 2023-01-12 MED ORDER — STERILE WATER FOR INJECTION IJ SOLN
INTRAMUSCULAR | Status: AC
  Filled 2023-01-12: qty 10

## 2023-01-12 MED ORDER — ALTEPLASE 2 MG IJ SOLR
4.0000 mg | Freq: Once | INTRAMUSCULAR | Status: AC
  Administered 2023-01-12: 4 mg

## 2023-01-12 MED ORDER — EPOETIN ALFA 4000 UNIT/ML IJ SOLN
INTRAMUSCULAR | Status: AC
  Filled 2023-01-12: qty 1

## 2023-01-12 MED ORDER — FENTANYL 12 MCG/HR TD PT72
1.0000 | MEDICATED_PATCH | TRANSDERMAL | Status: DC
  Administered 2023-01-12: 1 via TRANSDERMAL
  Filled 2023-01-12: qty 1

## 2023-01-12 NOTE — Plan of Care (Signed)
Patient's family has elected to transition her to comfort care. Neurology to sign off, but please re-engage if any new neurologic concerns arise.  Cynthia Neighbors, MD [facility] [phone]  If 7pm- 7am, please page neurology on call as listed in AMION.

## 2023-01-12 NOTE — Progress Notes (Signed)
Per Dr Wouk, dc tele monitoring  

## 2023-01-12 NOTE — Plan of Care (Addendum)
PMT updated by primary service that daughter has elected hospice level care.  As per our discussion, PMT will sign off at this time.  Please reconsult if needs arise.

## 2023-01-12 NOTE — Progress Notes (Signed)
[facility]- Civil engineer, contracting [facility])  Informed that the patient's daughter is interested in the Hospice Home. Daughter is now wanting comfort care only for the patient.   Daughter was not at the bedside.  HL left her a voice mail, asking that she call to talk about the inpatient unit. Awaiting a return phone call.     Please don't hesitate to call with any Hospice related questions or concerns.    Thank you for the opportunity to participate in this patient's care. [facility] Liaison [phone]

## 2023-01-12 NOTE — Progress Notes (Addendum)
Hemodialysis note  Received patient in bed to unit. Alert and oriented x 1.  Informed consent signed and in chart.  Treatment initiated: 0923 Treatment completed: 1127  Ended patient's treatment early because of multiple alarms for high arterial pressure, despite flushing and switching the lines. Catheters filled with activase.  Patient tolerated well. Transported back to room, alert without acute distress.  Report given to patient's RN.   Access used: Left Chest HD Catheter Access issues: Multiple alarms for high arterial pressure  Total UF removed: 0 Medication(s) given:  Epogen 4000 units  Post HD weight: 81.3 kg   Wolfgang [facility]

## 2023-01-12 NOTE — Progress Notes (Signed)
[facility]  PROGRESS NOTE   Subjective:   Patient seen laying in bed Daughter at bedside  Daughter, Selena Batten, agreeable to proceed with HHD training.   Dialysis scheduled for later this morning  Objective:  Vital signs: Blood pressure (!) 72/46, pulse (!) 51, temperature 97.8 F (36.6 C), temperature source Axillary, resp. rate (!) 22, height 6' (1.829 m), weight 81.3 kg, SpO2 97 %.  Intake/Output Summary (Last 24 hours) at 01/12/2023 1119 Last data filed at 01/11/2023 1927 Gross per 24 hour  Intake 240 ml  Output --  Net 240 ml    Filed Weights   01/09/23 0744 01/09/23 1212 01/12/23 0907  Weight: 85.1 kg 82.3 kg 81.3 kg     Physical Exam: General: NAD, somnolent  Head:  Normocephalic, atraumatic. Moist oral mucosal membranes  Eyes:  Anicteric  Lungs:   Clear to auscultation, normal effort  Heart:  Irregular  Abdomen:   Soft, nontender, bowel sounds present  Extremities:  Trace peripheral edema.  Neurologic:  Alert and oriented to self   Skin:  No lesions  Access:  [facility] permcath    Basic Metabolic Panel: Recent Labs  Lab 01/06/23 0856 01/09/23 0805 01/12/23 0525  NA 139 140 136  K 3.6 3.5 4.3  CL 99 102 98  CO2 29 26 27   GLUCOSE 109* 135* 127*  BUN 31* 35* 45*  CREATININE 5.91* 7.35* 8.30*  CALCIUM 8.8* 9.6 8.9  PHOS 2.9 3.4  --     GFR: Estimated Creatinine Clearance: 7.2 mL/min (A) (by C-G formula based on SCr of 8.3 mg/dL (H)).  Liver Function Tests: Recent Labs  Lab 01/06/23 0856 01/09/23 0805  ALBUMIN 2.8* 3.1*    No results for input(s): "LIPASE", "AMYLASE" in the last 168 hours. No results for input(s): "AMMONIA" in the last 168 hours.  CBC: Recent Labs  Lab 01/06/23 0856 01/09/23 0800 01/12/23 0921  WBC 6.0 7.3 7.9  HGB 10.4* 10.7* 10.7*  HCT 35.5* 35.2* 35.6*  MCV 88.5 86.7 86.6  PLT 198 235 200      HbA1C: Hemoglobin A1C  Date/Time Value Ref Range Status  06/15/2014 12:58 AM 7.9 (H) 4.2 - 6.3 % Final     Comment:    The American Diabetes Association recommends that a primary goal of therapy should be <7% and that physicians should reevaluate the treatment regimen in patients with HbA1c values consistently >8%.   12/24/2013 11:18 AM 8.2 (H) 4.2 - 6.3 % Final    Comment:    The American Diabetes Association recommends that a primary goal of therapy should be <7% and that physicians should reevaluate the treatment regimen in patients with HbA1c values consistently >8%.    Hgb A1c MFr Bld  Date/Time Value Ref Range Status  01/10/2023 08:07 AM 5.1 4.8 - 5.6 % Final    Comment:    (NOTE) Pre diabetes:          5.7%-6.4%  Diabetes:              >6.4%  Glycemic control for   <7.0% adults with diabetes   10/13/2022 11:46 AM 5.2 4.8 - 5.6 % Final    Comment:    (NOTE)         Prediabetes: 5.7 - 6.4         Diabetes: >6.4         Glycemic control for adults with diabetes: <7.0     Urinalysis: No results for input(s): "COLORURINE", "LABSPEC", "PHURINE", "GLUCOSEU", "HGBUR", "BILIRUBINUR", "  KETONESUR", "PROTEINUR", "UROBILINOGEN", "NITRITE", "LEUKOCYTESUR" in the last 72 hours.  Invalid input(s): "APPERANCEUR"    Imaging: MR ANGIO HEAD WO CONTRAST  Result Date: 01/12/2023 CLINICAL DATA:  Acute neurologic deficit EXAM: MRA HEAD WITHOUT CONTRAST TECHNIQUE: Angiographic images of the Circle of Willis were acquired using MRA technique without intravenous contrast. COMPARISON:  None Available. FINDINGS: POSTERIOR CIRCULATION: --Vertebral arteries: Normal --Inferior cerebellar arteries: Normal. --Basilar artery: Normal. --Superior cerebellar arteries: Normal. --Posterior cerebral arteries: Fetal origin of the right PCA. Left PCA is predominantly supplied by the P-comm. The left distal P1 segment is severely stenotic. ANTERIOR CIRCULATION: --Intracranial internal carotid arteries: Normal. --Anterior cerebral arteries (ACA): Normal. --Middle cerebral arteries (MCA): Normal. Other: None.  IMPRESSION: 1. No emergent large vessel occlusion. 2. Severe stenosis of the left PCA distal P1 segment. Electronically Signed   By: Deatra Robinson M.D.   On: 01/12/2023 01:29   US Carotid Bilateral  Result Date: 01/11/2023 CLINICAL DATA:  Stroke EXAM: BILATERAL CAROTID DUPLEX ULTRASOUND TECHNIQUE: Wallace Cullens scale imaging, color Doppler and duplex ultrasound were performed of bilateral carotid and vertebral arteries in the neck. COMPARISON:  03/08/2018 FINDINGS: Criteria: Quantification of carotid stenosis is based on velocity parameters that correlate the residual internal carotid diameter with NASCET-based stenosis levels, using the diameter of the distal internal carotid lumen as the denominator for stenosis measurement. The following velocity measurements were obtained: RIGHT ICA: 88/21 cm/sec CCA: 48/10 cm/sec SYSTOLIC ICA/CCA RATIO:  1.8 ECA: 77 cm/sec LEFT ICA: 47/12 cm/sec CCA: 65/9 cm/sec SYSTOLIC ICA/CCA RATIO:  0.7 ECA: 98 cm/sec RIGHT CAROTID ARTERY: Moderate calcified plaque of the right internal carotid artery origin and distal common carotid arteries. RIGHT VERTEBRAL ARTERY:  Antegrade flow. LEFT CAROTID ARTERY: Mild calcified plaque of the left distal common and proximal internal carotid arteries. LEFT VERTEBRAL ARTERY:  Antegrade flow. IMPRESSION: 1. Bilateral proximal internal carotid artery calcified plaque with Doppler measurements consistent with less than 50% stenosis. 2. Partially occlusive acute appearing thrombus seen in the left internal jugular vein. Electronically Signed   By: Acquanetta Belling M.D.   On: 01/11/2023 19:27   ECHOCARDIOGRAM COMPLETE  Result Date: 01/10/2023    ECHOCARDIOGRAM REPORT   Patient Name:   Cynthia Dean Date of Exam: 01/10/2023 Medical Rec #:  [id number]              Height:       72.0 in Accession #:    [id number]             Weight:       181.4 lb Date of Birth:  11/08/1950               BSA:          2.044 m Patient Age:    72 years               BP:            134/81 mmHg Patient Gender: F                      HR:           107 bpm. Exam Location:  [facility] Procedure: 2D Echo, Cardiac Doppler and Color Doppler Indications:     Stroke  History:         Patient has prior history of Echocardiogram examinations, most                  recent 10/19/2022. CHF, CAD, Stroke and COPD,  Signs/Symptoms:Bacteremia; Risk Factors:Hypertension, Diabetes                  and Dyslipidemia. Breast CA, ESRD on Dialysis, Pulmonary                  embolus.  Sonographer:     Mikki Harbor Referring Phys:  [id number] VIPUL [facility] Diagnosing Phys: Yvonne Kendall MD  Sonographer Comments: Technically difficult study due to poor echo windows. Image acquisition challenging due to respiratory motion and Image acquisition challenging due to COPD. IMPRESSIONS  1. Left ventricular ejection fraction, by estimation, is 55 to 60%. The left ventricle has normal function. Left ventricular endocardial border not optimally defined to evaluate regional wall motion. There is mild left ventricular hypertrophy. Left ventricular diastolic parameters are indeterminate.  2. Right ventricular systolic function is mildly reduced. The right ventricular size is normal. There is normal pulmonary artery systolic pressure.  3. The mitral valve is abnormal. Trivial mitral valve regurgitation.  4. Tricuspid valve regurgitation is mild to moderate.  5. The aortic valve is tricuspid. Aortic valve regurgitation is not visualized. No aortic stenosis is present.  6. The inferior vena cava is normal in size with greater than 50% respiratory variability, suggesting right atrial pressure of 3 mmHg. FINDINGS  Left Ventricle: Left ventricular ejection fraction, by estimation, is 55 to 60%. The left ventricle has normal function. Left ventricular endocardial border not optimally defined to evaluate regional wall motion. The left ventricular internal cavity size was normal in size. There is mild left ventricular hypertrophy.  Left ventricular diastolic parameters are indeterminate. Right Ventricle: The right ventricular size is normal. No increase in right ventricular wall thickness. Right ventricular systolic function is mildly reduced. There is normal pulmonary artery systolic pressure. The tricuspid regurgitant velocity is 2.14 m/s, and with an assumed right atrial pressure of 3 mmHg, the estimated right ventricular systolic pressure is 21.3 mmHg. Left Atrium: Left atrial size was normal in size. Right Atrium: Right atrial size was normal in size. Pericardium: There is no evidence of pericardial effusion. Mitral Valve: The mitral valve is abnormal. There is mild thickening of the mitral valve leaflet(s). There is mild calcification of the mitral valve leaflet(s). Trivial mitral valve regurgitation. MV peak gradient, 5.5 mmHg. The mean mitral valve gradient is 2.0 mmHg. Tricuspid Valve: The tricuspid valve is normal in structure. Tricuspid valve regurgitation is mild to moderate. Aortic Valve: The aortic valve is tricuspid. Aortic valve regurgitation is not visualized. No aortic stenosis is present. Aortic valve mean gradient measures 3.0 mmHg. Aortic valve peak gradient measures 5.2 mmHg. Aortic valve area, by VTI measures 3.39 cm. Pulmonic Valve: The pulmonic valve was not well visualized. Pulmonic valve regurgitation is not visualized. No evidence of pulmonic stenosis. Aorta: The aortic root is normal in size and structure. Pulmonary Artery: The pulmonary artery is not well seen. Venous: The inferior vena cava is normal in size with greater than 50% respiratory variability, suggesting right atrial pressure of 3 mmHg. IAS/Shunts: The interatrial septum was not well visualized.  LEFT VENTRICLE PLAX 2D LVIDd:         4.70 cm LVIDs:         3.10 cm LV PW:         1.30 cm LV IVS:        1.10 cm LVOT diam:     2.00 cm LV SV:         60 LV SV Index:   29 LVOT Area:  3.14 cm  RIGHT VENTRICLE RV Basal diam:  3.25 cm RV Mid diam:    3.70  cm RV S prime:     9.03 cm/s LEFT ATRIUM             Index        RIGHT ATRIUM           Index LA diam:        4.10 cm 2.01 cm/m   RA Area:     15.20 cm LA Vol (A2C):   63.2 ml 30.92 ml/m  RA Volume:   41.10 ml  20.11 ml/m LA Vol (A4C):   54.1 ml 26.47 ml/m LA Biplane Vol: 59.8 ml 29.25 ml/m  AORTIC VALVE                    PULMONIC VALVE AV Area (Vmax):    3.28 cm     PV Vmax:       0.71 m/s AV Area (Vmean):   2.94 cm     PV Peak grad:  2.0 mmHg AV Area (VTI):     3.39 cm AV Vmax:           114.00 cm/s AV Vmean:          75.800 cm/s AV VTI:            0.176 m AV Peak Grad:      5.2 mmHg AV Mean Grad:      3.0 mmHg LVOT Vmax:         119.00 cm/s LVOT Vmean:        71.000 cm/s LVOT VTI:          0.190 m LVOT/AV VTI ratio: 1.08  AORTA Ao Root diam: 3.40 cm MITRAL VALVE                TRICUSPID VALVE MV Area (PHT): 7.90 cm     TR Peak grad:   18.3 mmHg MV Area VTI:   3.80 cm     TR Vmax:        214.00 cm/s MV Peak grad:  5.5 mmHg MV Mean grad:  2.0 mmHg     SHUNTS MV Vmax:       1.17 m/s     Systemic VTI:  0.19 m MV Vmean:      60.4 cm/s    Systemic Diam: 2.00 cm MV Decel Time: 96 msec MV E velocity: 116.00 cm/s Yvonne Kendall MD Electronically signed by Yvonne Kendall MD Signature Date/Time: 01/10/2023/4:10:48 PM    Final      Medications:    sodium chloride Stopped (11/16/22 1155)   anticoagulant sodium citrate      (feeding supplement) PROSource Plus  30 mL Oral TID BM   alteplase  4 mg Intracatheter Once   aspirin EC  325 mg Oral Daily   atorvastatin  40 mg Oral Daily   Chlorhexidine Gluconate Cloth  6 each Topical Q0600   cinacalcet  60 mg Oral Q supper   clopidogrel  75 mg Oral Daily   epoetin (EPOGEN/PROCRIT) injection  4,000 Units Intravenous Q M,W,F-HD   feeding supplement (NEPRO CARB STEADY)  237 mL Oral TID BM   fentaNYL  1 patch Transdermal Q72H   gabapentin  100 mg Oral Once per day on Mon Wed Fri   heparin injection (subcutaneous)  5,000 Units Subcutaneous Q8H    levETIRAcetam  1,000 mg Oral Daily   levETIRAcetam  250 mg Oral Q M,W,F  lidocaine  1 patch Transdermal Q24H   pantoprazole  40 mg Oral BID   QUEtiapine  25 mg Oral QHS   sertraline  25 mg Oral Daily   sodium chloride flush  10-40 mL Intracatheter Q12H   umeclidinium-vilanterol  1 puff Inhalation Daily    Assessment/ Plan:     [name] is a 72 y.o. female with end stage renal disease on hemodialysis, CAD, COPD with intermittent home O2, hypertension, hyperlipidemia, stroke, diabetes who has been admitted since 3/20. Initially admitted for vertebral osteomyelitis. She has completed IV antibiotics. Now is working on placement.   Due to extended hospitalization, patient will need outpatient dialysis clinic re-initiated.   [facility] T[facility] permcath   #1 ESRD:  - Scheduled for dialysis later this morning - Increased venous pressures, may terminate treatment early and cathflo access. - Next treatment scheduled for Friday - Renal navigator aware of dialysis continuation and has notified outpatient clinic tore-arrange HHD training.    #2: Anemia with chronic kidney disease: hemoglobin remains 10.7.   #3: Second hyperparathyroidism: With hypercalcemia Lab Results  Component Value Date   PTH 157 (H) 12/08/2022   CALCIUM 8.9 01/12/2023   CAION 1.09 (L) 08/28/2022   PHOS 3.4 01/09/2023   - Zolmeta 4mg  IV on 12/22/2022 -Bone minerals acceptable at this time -Continue Cinacalcet to 60 mg daily. -Binders reman held   #4: Hypertension, now hypotensive  Not currently on any blood pressure agents.  Blood pressure remains soft. No UF with dialysis    LOS: [id number] [facility] 6/20/202411:19 AM

## 2023-01-12 NOTE — Progress Notes (Signed)
  Progress Note   [name]:[id number] DOB: 1951-04-12 DOA: 10/12/2022     92 DOS: the patient was seen and examined on 01/12/2023   Brief hospital course: Cynthia Dean is a 72 y.o. 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 She presents to the ED with worsening lower extremity pain. Admitted for possible Discitis/osteomyelitis at L4-L5: w/ suspected intradiscal abscess as per MRI. 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   4/18, HD access / fistula clotted off during dialysis.  4/19 vascular placed R femoral temp HD cath placed, resumed HD and pemcath placed 4/24.  5/14- back pain seems to be well-controlled.  Caseworkers have discussed the case, involved ethics committee/Hospital administration. Looking into home dialysis.  Lacks capacity to make informed decision.   6/17: New expressive aphasia since yesterday per nursing, MRI brain  6/18: New stroke confirmed on MRI, neuro consult, stroke workup, palliative care reeval, stop fentanyl  6/19: family declines comfort care/hospice, wants to continue to pursue home dialysis  6/20: daughter now elects comfort care  Assessment and Plan:  End-of-life care Daughter elects full comfort care today after discussion with patient's PCP. Desires inpatient hospice, that team has been notified. Comfort care orders placed  Hospital problems included the following: Nausea and vomiting Prolonged QT interval. Acute stroke Right knee pain due to osteoarthritis. 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  Anemia of chronic kidney  disease. Hypokalemia Hypophosphatemia. Generalized weakness / Physical Debility:  Severe back and bilateral leg pain due to severe multi-level lumbar degenerative disc disease. Peripheral neuropathy. COPD: DM2:        Subjective: no distress  Physical Exam: Vitals:   01/12/23 1100 01/12/23 1127 01/12/23 1130 01/12/23 1305  BP: (!) 72/46 (!) 109/56 (!) 100/48 115/85  Pulse: (!) 51 (!) 27 (!) 30 72  Resp: (!) 22 17 18 18   Temp:  98 F (36.7 C)  98.6 F (37 C)  TempSrc:  Oral  Oral  SpO2: 97% 90% 91% 100%  Weight:  81.3 kg    Height:       General exam: smiling  Respiratory system: normal wob Cardiovascular system: Regular rate and rhythm Gastrointestinal system: Soft,   Central nervous system: Disoriented, awake, moving all 4 Extremities: warm Skin: No visible rashes, lesions or ulcers  Data Reviewed:   Family Communication: Updated daughter Cynthia Dean at bedside  Disposition: Status is: Inpatient Remains inpatient appropriate because: pending eval for inpatient hospice  Planned Discharge Destination: hospice, possible inpatient  Time spent: 35 minutes  Author: Silvano Bilis, MD 01/12/2023 3:08 PM  For on call review [email or web address].

## 2023-01-13 NOTE — Discharge Planning (Signed)
Patient has been moved to comfort care. Plans for home hemo have been canceled.

## 2023-01-13 NOTE — TOC Transition Note (Signed)
Transition of Care [facility]) - CM/SW Discharge Note   Patient Details  Name: Cynthia Dean MRN: [id number] Date of Birth: Aug 24, 1950  Transition of Care [facility]) CM/SW Contact:  [name], LCSW Phone Number: 01/13/2023, 1:05 PM   Clinical Narrative:      Patient's daughter Selena Batten who is the [facility], and she has decided to chose [facility] hospice facility in [place].  CSW was informed by [facility] liaison Ree Kida, that bed is available for patient to go today.  Patient to be d/c'ed today to [facility].  Patient and family agreeable to plans will transport via ems RN to call report to [phone].  Per Ree Kida, he called EMS around 12:55pm, EMS said they should be at the hospital around 1:15pm.  TOC tried to update Nia Manson Passey APS worker [phone], had to leave a message awaiting for a call back.    Final next level of care: Hospice Medical Facility Barriers to Discharge: Barriers Resolved   Patient Goals and CMS Choice CMS Medicare.gov Compare Post Acute Care list provided to:: Patient Represenative (must comment) Choice offered to / list presented to : Adult Children  Discharge Placement                Patient chooses bed at: Other - please specify in the comment section below: ([facility] facility in [place].) Patient to be transferred to facility by: [facility] Name of family member notified: Patient's daughter Selena Batten Patient and family notified of of transfer: 01/13/23  Discharge Plan and Services Additional resources added to the After Visit Summary for                                       Social Determinants of Health (SDOH) Interventions SDOH Screenings   Food Insecurity: No Food Insecurity (10/12/2022)  Housing: Low Risk  (10/12/2022)  Transportation Needs: No Transportation Needs (10/12/2022)  Utilities: Not At Risk (10/12/2022)  Financial Resource Strain: Low Risk  (11/12/2017)  Physical Activity:  Insufficiently Active (11/12/2017)  Social Connections: Moderately Integrated (11/12/2017)  Stress: No Stress Concern Present (11/12/2017)  Tobacco Use: Medium Risk (11/16/2022)     Readmission Risk Interventions     No data to display

## 2023-01-13 NOTE — Care Management Important Message (Signed)
Important Message  Patient Details  Name: Cynthia Dean MRN: [id number] Date of Birth: Nov 20, 1950   Medicare Important Message Given:  Other (see comment)  Patient is on comfort care and may transfer to the Hospice home when bed is available. Out of respect for the patient and family no Important Message from [facility] given.   Olegario Messier A Layken Beg 01/13/2023, 9:02 AM

## 2023-01-13 NOTE — Progress Notes (Signed)
[facility]- Civil engineer, contracting [facility])    Received request from Transitions of Care Manger for family interest in Hospice Home.  Eligibility has been confirmed.   Met/Spoke with daughter, Selena Batten, to confirm interest and explain services.  Family agreeable to transfer today.  Transitions of Care manager aware.  RN please call report to [phone] [facility]) prior to patient leaving the unit.  Please send signed and completed DNR with patient at discharge.  Thank you  Redge Gainer,  [facility] Liaison [phone]

## 2023-01-13 NOTE — Discharge Summary (Signed)
Cynthia Dean ZOX:[id number] DOB: 09-09-1950 DOA: 10/12/2022  PCP: Loistine Chance, MD  Admit date: 10/12/2022 Discharge date: 01/13/2023  Time spent: 35 minutes      Discharge Diagnoses:  Principal Problem:   Psoas abscess [facility]) Active Problems:   Anemia   Bilateral leg pain   ESRD on dialysis [facility])   Essential hypertension   Dyslipidemia   Seizure disorder (HCC)   Prolonged QT interval   Nausea & vomiting   Diabetic retinopathy without macular edema associated with type 2 diabetes mellitus (HCC)   GERD (gastroesophageal reflux disease)   Diabetic neuropathy (HCC)   Anemia of chronic disease   Insulin-requiring or dependent type II diabetes mellitus (HCC)   Back pain   Discitis of lumbar region   Myositis   Malnutrition of moderate degree   Knee osteomyelits, right (HCC)   Hypokalemia   Hypophosphatemia   Goals of care, counseling/discussion   Discharge Condition: stable  Diet recommendation: ad lib  Filed Weights   01/09/23 1212 01/12/23 0907 01/12/23 1127  Weight: 82.3 kg 81.3 kg 81.3 kg    History of present illness:  From admission h and p  72 y/o F w/ PMH of ESRD on HD, HLD, COPD, DM2, HTN, depression, CVA who presents w/ back pain x 3 weeks. Hx was obtained from pt and pt's daughter. Pt is a poor historian. The back pain has become progressively worse. The pain is located in the lower back that is stabbing, intermittent w/ radiation down both legs. Pain meds helps with the pain and nothing makes the pain worse. The severity is currently 7/10. Pt denies any recents falls and/or trauma. Pt uses a walker.  Pt also c/o subjective fever. Pt denies any sweating, chest pain, cough, shortness of breath, nausea, vomiting, abd pain, urinary urgency, urinary frequency, diarrhea or constipation.   Hospital Course:  93 day hospital course. Presented with lower extremity pain. Found to have discitis/osteomyelitis of l4-5 with psoas abscess. IR drained it. Treated  with course of IV antibiotics. Maintained on hemodialysis. Acute CVA this hospitalization as well. Encephalopathic. Severe confusion did not improve. Prolonged hospital course as difficult to place, for a while plan was discharge home with home dialysis. Then daughter made decision to transition to full comfort care, discharged to inpatient hospice.  Procedures: Dialysis, dialysis catheter placement, IR aspiration  Consultations: IR, vascular, nephrology, palliative  Discharge Exam: Vitals:   01/12/23 1305 01/12/23 2040  BP: 115/85 110/62  Pulse: 72 95  Resp: 18 20  Temp: 98.6 F (37 C) 97.9 F (36.6 C)  SpO2: 100% 98%    General exam: smiling  Respiratory system: normal wob Cardiovascular system: Regular rate and rhythm Gastrointestinal system: Soft,   Central nervous system: Disoriented, awake, moving all 4 Extremities: warm Skin: No visible rashes, lesions or ulcers  Discharge Instructions   Discharge Instructions     Diet general   Complete by: As directed    Increase activity slowly   Complete by: As directed    No wound care   Complete by: As directed       Allergies as of 01/13/2023       Reactions   Oxycodone Nausea And Vomiting   Oxycodone-acetaminophen Other (See Comments)   Wound Dressing Adhesive    Hydrocodone    Intolerant more than allergic   Tape Itching   Skin Dermatitis/itching (tape adhesive) Skin Dermatitis/itching (tape adhesive)   Tapentadol Itching   Skin Dermatitis/itching (tape adhesive) Skin Dermatitis/itching (tape adhesive)  Medication List     STOP taking these medications    acetaminophen 500 MG tablet Commonly known as: TYLENOL   aspirin EC 81 MG tablet   atorvastatin 80 MG tablet Commonly known as: LIPITOR   Breo Ellipta 100-25 MCG/ACT Aepb Generic drug: fluticasone furoate-vilanterol   cinacalcet 30 MG tablet Commonly known as: SENSIPAR   cyclobenzaprine 10 MG tablet Commonly known as: FLEXERIL    diclofenac Sodium 1 % Gel Commonly known as: VOLTAREN   diphenhydrAMINE 25 MG tablet Commonly known as: BENADRYL   diphenoxylate-atropine 2.5-0.025 MG tablet Commonly known as: LOMOTIL   epoetin alfa 53664 UNIT/ML injection Commonly known as: EPOGEN   fluticasone 50 MCG/ACT nasal spray Commonly known as: FLONASE   gabapentin 100 MG capsule Commonly known as: NEURONTIN   glucose 4 GM chewable tablet   levETIRAcetam 1000 MG tablet Commonly known as: KEPPRA   levETIRAcetam 250 MG tablet Commonly known as: KEPPRA   lidocaine 5 % Commonly known as: LIDODERM   losartan 100 MG tablet Commonly known as: COZAAR   metoprolol succinate 100 MG 24 hr tablet Commonly known as: TOPROL-XL   NovoLIN 70/30 (70-30) 100 UNIT/ML injection Generic drug: insulin NPH-regular Human   omeprazole 20 MG capsule Commonly known as: PRILOSEC   polyethylene glycol 17 g packet Commonly known as: MIRALAX / GLYCOLAX   senna-docusate 8.6-50 MG tablet Commonly known as: Senokot-S   sertraline 25 MG tablet Commonly known as: ZOLOFT   torsemide 100 MG tablet Commonly known as: DEMADEX   traMADol 50 MG tablet Commonly known as: ULTRAM   traZODone 50 MG tablet Commonly known as: DESYREL   umeclidinium-vilanterol 62.5-25 MCG/ACT Aepb Commonly known as: ANORO ELLIPTA   vitamin D3 25 MCG tablet Commonly known as: CHOLECALCIFEROL       Allergies  Allergen Reactions   Oxycodone Nausea And Vomiting   Oxycodone-Acetaminophen Other (See Comments)   Wound Dressing Adhesive    Hydrocodone     Intolerant more than allergic   Tape Itching    Skin Dermatitis/itching (tape adhesive) Skin Dermatitis/itching (tape adhesive)   Tapentadol Itching    Skin Dermatitis/itching (tape adhesive) Skin Dermatitis/itching (tape adhesive)       The results of significant diagnostics from this hospitalization (including imaging, microbiology, ancillary and laboratory) are listed below for reference.     Significant Diagnostic Studies: MR ANGIO HEAD WO CONTRAST  Result Date: 01/12/2023 CLINICAL DATA:  Acute neurologic deficit EXAM: MRA HEAD WITHOUT CONTRAST TECHNIQUE: Angiographic images of the Circle of Willis were acquired using MRA technique without intravenous contrast. COMPARISON:  None Available. FINDINGS: POSTERIOR CIRCULATION: --Vertebral arteries: Normal --Inferior cerebellar arteries: Normal. --Basilar artery: Normal. --Superior cerebellar arteries: Normal. --Posterior cerebral arteries: Fetal origin of the right PCA. Left PCA is predominantly supplied by the P-comm. The left distal P1 segment is severely stenotic. ANTERIOR CIRCULATION: --Intracranial internal carotid arteries: Normal. --Anterior cerebral arteries (ACA): Normal. --Middle cerebral arteries (MCA): Normal. Other: None. IMPRESSION: 1. No emergent large vessel occlusion. 2. Severe stenosis of the left PCA distal P1 segment. Electronically Signed   By: Deatra Robinson M.D.   On: 01/12/2023 01:29   US Carotid Bilateral  Result Date: 01/11/2023 CLINICAL DATA:  Stroke EXAM: BILATERAL CAROTID DUPLEX ULTRASOUND TECHNIQUE: Wallace Cullens scale imaging, color Doppler and duplex ultrasound were performed of bilateral carotid and vertebral arteries in the neck. COMPARISON:  03/08/2018 FINDINGS: Criteria: Quantification of carotid stenosis is based on velocity parameters that correlate the residual internal carotid diameter with NASCET-based stenosis levels, using the diameter of  the distal internal carotid lumen as the denominator for stenosis measurement. The following velocity measurements were obtained: RIGHT ICA: 88/21 cm/sec CCA: 48/10 cm/sec SYSTOLIC ICA/CCA RATIO:  1.8 ECA: 77 cm/sec LEFT ICA: 47/12 cm/sec CCA: 65/9 cm/sec SYSTOLIC ICA/CCA RATIO:  0.7 ECA: 98 cm/sec RIGHT CAROTID ARTERY: Moderate calcified plaque of the right internal carotid artery origin and distal common carotid arteries. RIGHT VERTEBRAL ARTERY:  Antegrade flow. LEFT CAROTID  ARTERY: Mild calcified plaque of the left distal common and proximal internal carotid arteries. LEFT VERTEBRAL ARTERY:  Antegrade flow. IMPRESSION: 1. Bilateral proximal internal carotid artery calcified plaque with Doppler measurements consistent with less than 50% stenosis. 2. Partially occlusive acute appearing thrombus seen in the left internal jugular vein. Electronically Signed   By: Acquanetta Belling M.D.   On: 01/11/2023 19:27   ECHOCARDIOGRAM COMPLETE  Result Date: 01/10/2023    ECHOCARDIOGRAM REPORT   Patient Name:   Cynthia Dean Date of Exam: 01/10/2023 Medical Rec #:  [id number]              Height:       72.0 in Accession #:    [id number]             Weight:       181.4 lb Date of Birth:  Feb 23, 1951               BSA:          2.044 m Patient Age:    71 years               BP:           134/81 mmHg Patient Gender: F                      HR:           107 bpm. Exam Location:  [facility] Procedure: 2D Echo, Cardiac Doppler and Color Doppler Indications:     Stroke  History:         Patient has prior history of Echocardiogram examinations, most                  recent 10/19/2022. CHF, CAD, Stroke and COPD,                  Signs/Symptoms:Bacteremia; Risk Factors:Hypertension, Diabetes                  and Dyslipidemia. Breast CA, ESRD on Dialysis, Pulmonary                  embolus.  Sonographer:     Mikki Harbor Referring Phys:  [id number] VIPUL [facility] Diagnosing Phys: Yvonne Kendall MD  Sonographer Comments: Technically difficult study due to poor echo windows. Image acquisition challenging due to respiratory motion and Image acquisition challenging due to COPD. IMPRESSIONS  1. Left ventricular ejection fraction, by estimation, is 55 to 60%. The left ventricle has normal function. Left ventricular endocardial border not optimally defined to evaluate regional wall motion. There is mild left ventricular hypertrophy. Left ventricular diastolic parameters are indeterminate.  2. Right ventricular systolic  function is mildly reduced. The right ventricular size is normal. There is normal pulmonary artery systolic pressure.  3. The mitral valve is abnormal. Trivial mitral valve regurgitation.  4. Tricuspid valve regurgitation is mild to moderate.  5. The aortic valve is tricuspid. Aortic valve regurgitation is not visualized. No aortic stenosis is present.  6. The inferior vena cava is  normal in size with greater than 50% respiratory variability, suggesting right atrial pressure of 3 mmHg. FINDINGS  Left Ventricle: Left ventricular ejection fraction, by estimation, is 55 to 60%. The left ventricle has normal function. Left ventricular endocardial border not optimally defined to evaluate regional wall motion. The left ventricular internal cavity size was normal in size. There is mild left ventricular hypertrophy. Left ventricular diastolic parameters are indeterminate. Right Ventricle: The right ventricular size is normal. No increase in right ventricular wall thickness. Right ventricular systolic function is mildly reduced. There is normal pulmonary artery systolic pressure. The tricuspid regurgitant velocity is 2.14 m/s, and with an assumed right atrial pressure of 3 mmHg, the estimated right ventricular systolic pressure is 21.3 mmHg. Left Atrium: Left atrial size was normal in size. Right Atrium: Right atrial size was normal in size. Pericardium: There is no evidence of pericardial effusion. Mitral Valve: The mitral valve is abnormal. There is mild thickening of the mitral valve leaflet(s). There is mild calcification of the mitral valve leaflet(s). Trivial mitral valve regurgitation. MV peak gradient, 5.5 mmHg. The mean mitral valve gradient is 2.0 mmHg. Tricuspid Valve: The tricuspid valve is normal in structure. Tricuspid valve regurgitation is mild to moderate. Aortic Valve: The aortic valve is tricuspid. Aortic valve regurgitation is not visualized. No aortic stenosis is present. Aortic valve mean gradient  measures 3.0 mmHg. Aortic valve peak gradient measures 5.2 mmHg. Aortic valve area, by VTI measures 3.39 cm. Pulmonic Valve: The pulmonic valve was not well visualized. Pulmonic valve regurgitation is not visualized. No evidence of pulmonic stenosis. Aorta: The aortic root is normal in size and structure. Pulmonary Artery: The pulmonary artery is not well seen. Venous: The inferior vena cava is normal in size with greater than 50% respiratory variability, suggesting right atrial pressure of 3 mmHg. IAS/Shunts: The interatrial septum was not well visualized.  LEFT VENTRICLE PLAX 2D LVIDd:         4.70 cm LVIDs:         3.10 cm LV PW:         1.30 cm LV IVS:        1.10 cm LVOT diam:     2.00 cm LV SV:         60 LV SV Index:   29 LVOT Area:     3.14 cm  RIGHT VENTRICLE RV Basal diam:  3.25 cm RV Mid diam:    3.70 cm RV S prime:     9.03 cm/s LEFT ATRIUM             Index        RIGHT ATRIUM           Index LA diam:        4.10 cm 2.01 cm/m   RA Area:     15.20 cm LA Vol (A2C):   63.2 ml 30.92 ml/m  RA Volume:   41.10 ml  20.11 ml/m LA Vol (A4C):   54.1 ml 26.47 ml/m LA Biplane Vol: 59.8 ml 29.25 ml/m  AORTIC VALVE                    PULMONIC VALVE AV Area (Vmax):    3.28 cm     PV Vmax:       0.71 m/s AV Area (Vmean):   2.94 cm     PV Peak grad:  2.0 mmHg AV Area (VTI):     3.39 cm AV Vmax:  114.00 cm/s AV Vmean:          75.800 cm/s AV VTI:            0.176 m AV Peak Grad:      5.2 mmHg AV Mean Grad:      3.0 mmHg LVOT Vmax:         119.00 cm/s LVOT Vmean:        71.000 cm/s LVOT VTI:          0.190 m LVOT/AV VTI ratio: 1.08  AORTA Ao Root diam: 3.40 cm MITRAL VALVE                TRICUSPID VALVE MV Area (PHT): 7.90 cm     TR Peak grad:   18.3 mmHg MV Area VTI:   3.80 cm     TR Vmax:        214.00 cm/s MV Peak grad:  5.5 mmHg MV Mean grad:  2.0 mmHg     SHUNTS MV Vmax:       1.17 m/s     Systemic VTI:  0.19 m MV Vmean:      60.4 cm/s    Systemic Diam: 2.00 cm MV Decel Time: 96 msec MV E  velocity: 116.00 cm/s Yvonne Kendall MD Electronically signed by Yvonne Kendall MD Signature Date/Time: 01/10/2023/4:10:48 PM    Final    DG Chest 2 View  Result Date: 01/10/2023 CLINICAL DATA:  CVA EXAM: CHEST - 2 VIEW COMPARISON:  Previous studies including the examination of 09/06/2022 FINDINGS: Transverse diameter of heart is increased. There is interval decrease in pulmonary vascular congestion. There is interval decrease in reticulonodular densities in both lungs. There is no focal pulmonary consolidation. There is minimal blunting of left lateral CP angle. There is no pneumothorax. Tip of right IJ central venous catheter is seen in superior vena cava. Tip of left IJ central venous catheter is seen in the region of right atrium. There is a cardiac monitoring device in left chest wall. Vascular stent is noted in left upper extremity. IMPRESSION: Cardiomegaly. There are no signs of alveolar pulmonary edema or new focal infiltrates. Possible minimal left pleural effusion. Electronically Signed   By: Ernie Avena M.D.   On: 01/10/2023 11:27   MR BRAIN WO CONTRAST  Result Date: 01/10/2023 CLINICAL DATA:  Acute neurologic deficit EXAM: MRI HEAD WITHOUT CONTRAST TECHNIQUE: Multiplanar, multiecho pulse sequences of the brain and surrounding structures were obtained without intravenous contrast. COMPARISON:  01/30/2020 FINDINGS: Brain: Abnormal cortical diffusion restriction within the posterior left MCA territory . No acute hemorrhage. Hemosiderin deposition in the right central sulcus. There is confluent hyperintense T2-weighted signal within the white matter. Generalized volume loss. Old anterior right MCA territory infarct. Multiple bilateral small vessel infarcts of the cerebellum. The midline structures are normal. Vascular: Major flow voids are preserved. Skull and upper cervical spine: Normal calvarium and skull base. Visualized upper cervical spine and soft tissues are normal. Sinuses/Orbits:No  paranasal sinus fluid levels or advanced mucosal thickening. No mastoid or middle ear effusion. Normal orbits. IMPRESSION: 1. Acute cortical ischemia within the posterior left MCA territory. No acute hemorrhage or mass effect. 2. Multiple old infarcts and findings of chronic microvascular ischemia. Electronically Signed   By: Deatra Robinson M.D.   On: 01/10/2023 03:11   DG Knee 1-2 Views Right  Result Date: 12/26/2022 CLINICAL DATA:  Right knee pain and swelling. EXAM: RIGHT KNEE - 1-2 VIEW COMPARISON:  Right tibia and fibula films on 05/09/2022 FINDINGS:  No evidence of fracture, dislocation, or joint effusion. Mild medial joint space narrowing and patellofemoral proliferative disease. No bony lesions or destruction. Arterial calcifications involving the distal SFA, popliteal artery and visualized proximal tibial arteries. IMPRESSION: 1. 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 2. Peripheral vascular disease. Electronically Signed   By: Irish Lack M.D.   On: 12/26/2022 08:53    Microbiology: No results found for this or any previous visit (from the past 240 hour(s)).   Labs: Basic Metabolic Panel: Recent Labs  Lab 01/09/23 0805 01/12/23 0525  NA 140 136  K 3.5 4.3  CL 102 98  CO2 26 27  GLUCOSE 135* 127*  BUN 35* 45*  CREATININE 7.35* 8.30*  CALCIUM 9.6 8.9  PHOS 3.4  --    Liver Function Tests: Recent Labs  Lab 01/09/23 0805  ALBUMIN 3.1*   No results for input(s): "LIPASE", "AMYLASE" in the last 168 hours. No results for input(s): "AMMONIA" in the last 168 hours. CBC: Recent Labs  Lab 01/09/23 0800 01/12/23 0921  WBC 7.3 7.9  HGB 10.7* 10.7*  HCT 35.2* 35.6*  MCV 86.7 86.6  PLT 235 200   Cardiac Enzymes: No results for input(s): "CKTOTAL", "CKMB", "CKMBINDEX", "TROPONINI" in the last 168 hours. BNP: BNP (last 3 results) No results for input(s): "BNP" in the last 8760 hours.  ProBNP (last 3 results) No results for input(s): "PROBNP" in  the last 8760 hours.  CBG: Recent Labs  Lab 01/06/23 1809  GLUCAP 127*       Signed:  Silvano Bilis MD.  [facility] 01/13/2023, 12:04 PM

## 2023-01-13 NOTE — Progress Notes (Signed)
Patient OOF via stretcher with EMS.

## 2023-02-23 DEATH — deceased

## 2023-09-10 IMAGING — MG MM BREAST LOCALIZATION CLIP
6 series · 6 of 18 positions shown · non-contrast
Comparison: Previous exam(s).

CLINICAL DATA: Status post ultrasound-guided core biopsy of a mass
in the 3 o'clock region of the left breast and a left axillary lymph
node.

EXAM:
3D DIAGNOSTIC LEFT MAMMOGRAM POST ULTRASOUND BIOPSIES

[L CC synth-2D]
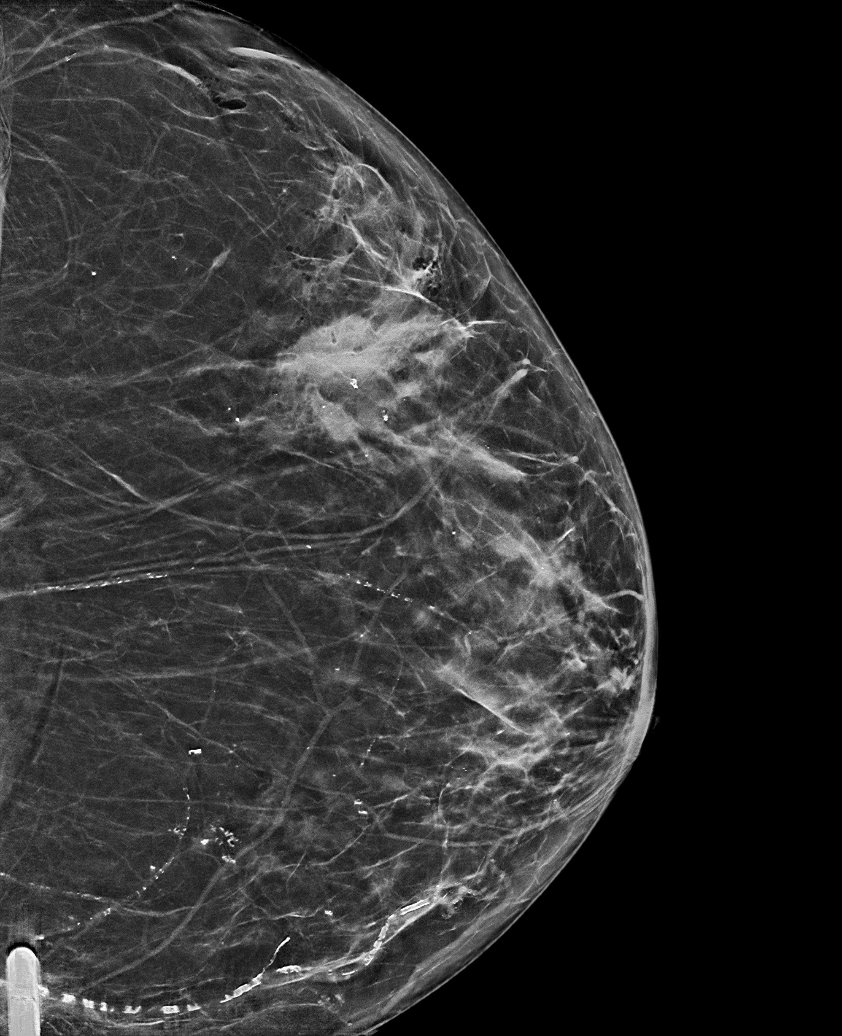

[L ML synth-2D]
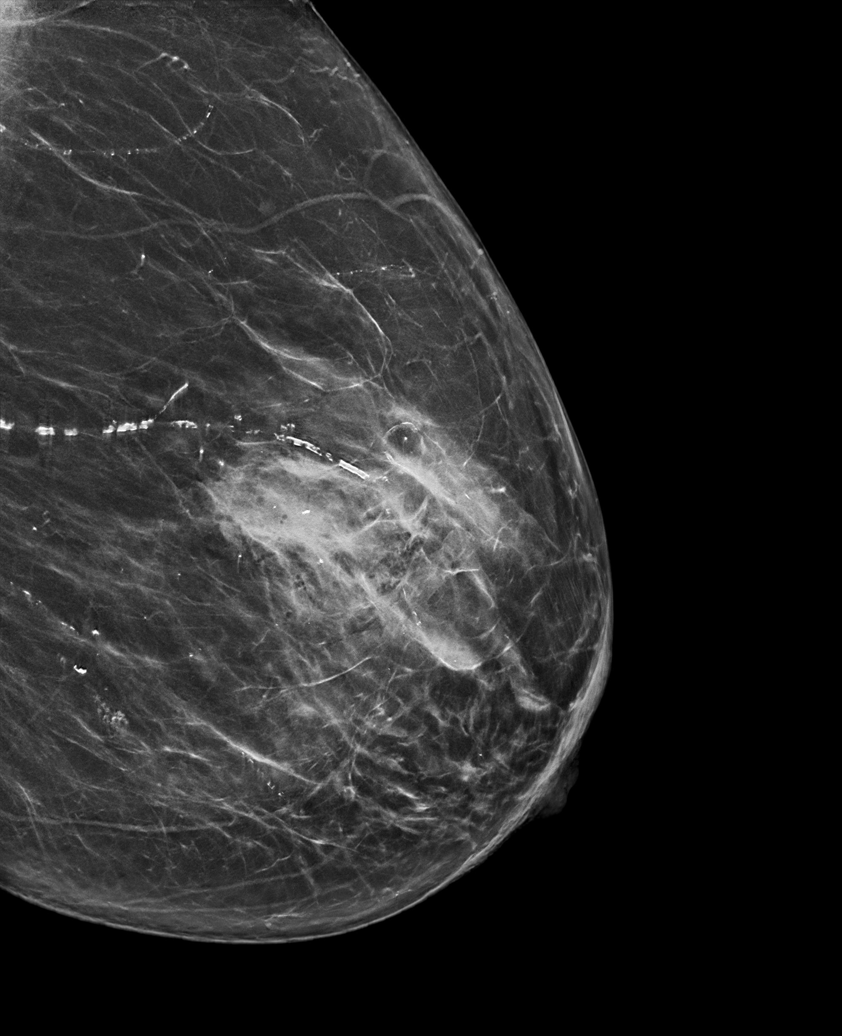

[L MLO synth-2D]
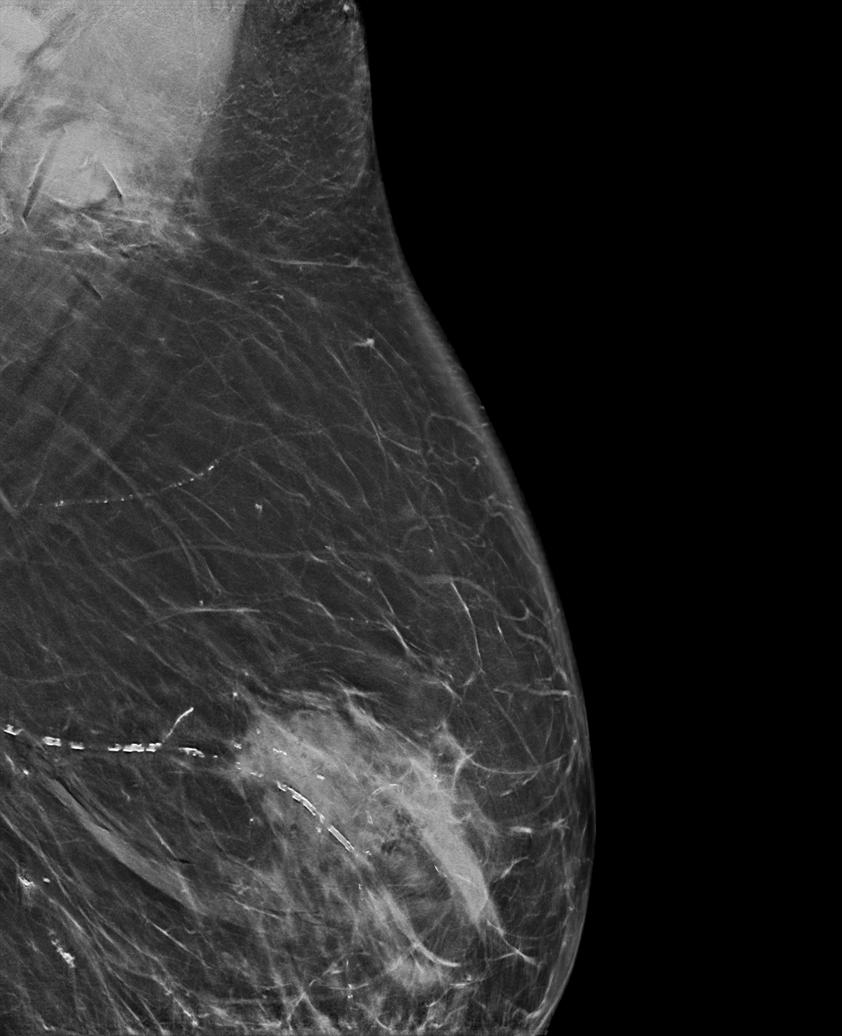

[L MLO tomo · tomo slice 50/99.0]
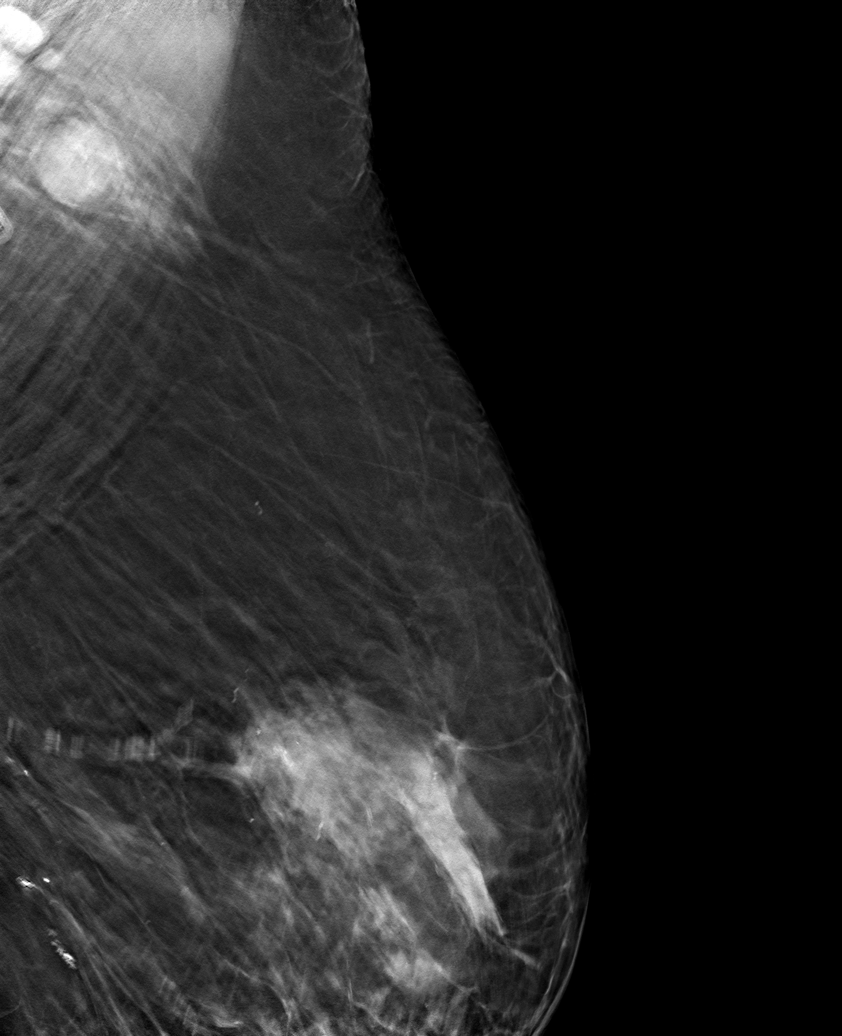

[L ML tomo · tomo slice 45/90.0]
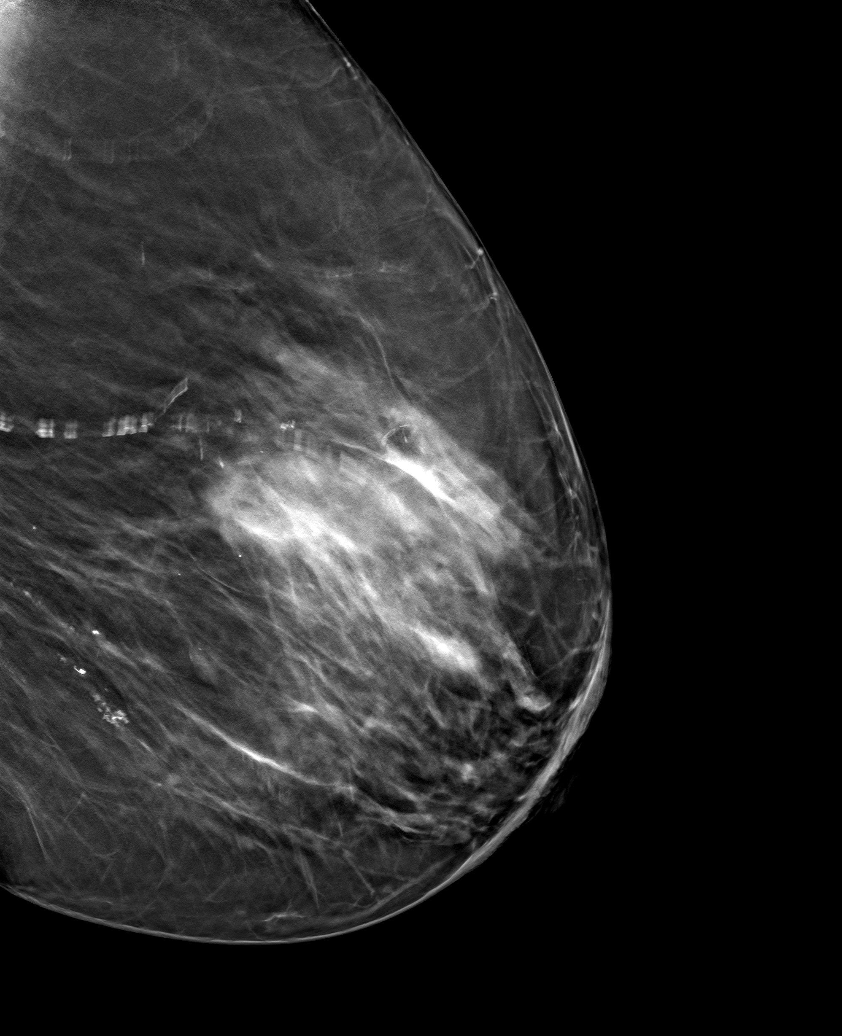

[L CC tomo · tomo slice 41/80.0]
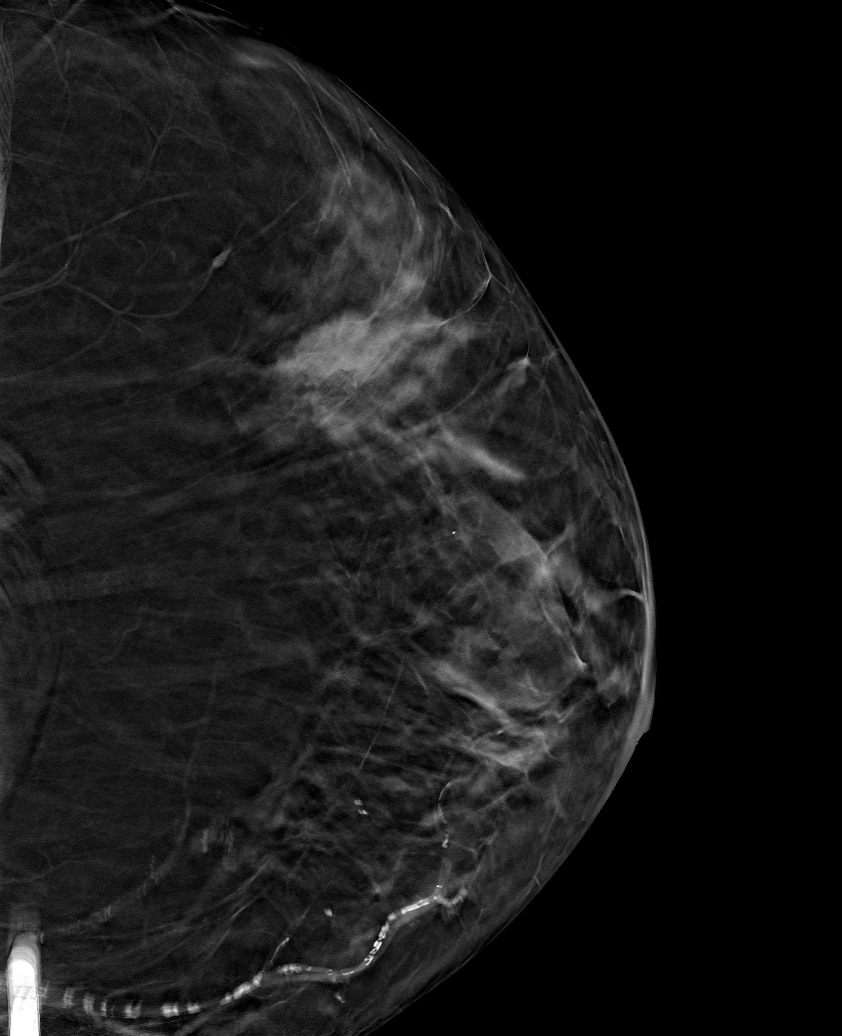

[6 of 18 positions shown; findings below may reference images not displayed]

FINDINGS: 3D Mammographic images were obtained following ultrasound-guided
core biopsies of a mass in the 3 o'clock region of the left breast
and a left axillary lymph node. There is a venous shaped clip in
appropriate position in the mass in the 3 o'clock region of the left
breast and a HydroMARK clip in the left axillary lymph node.
IMPRESSION: Appropriate positioning of the venous shaped clip in the mass in the
3 o'clock region of the left breast and the HydroMARK clip in the
left axilla.

Final Assessment: Post Procedure Mammograms for Marker Placement
